# Patient Record
Sex: Female | Born: 1975 | Race: Black or African American | Hispanic: No | Marital: Single | Smoking: Former smoker
Health system: Southern US, Community
[De-identification: ages and names within clinical notes are randomized; demographics above are authoritative.]

## PROBLEM LIST (undated history)

## (undated) DIAGNOSIS — Z8679 Personal history of other diseases of the circulatory system: Secondary | ICD-10-CM

## (undated) DIAGNOSIS — F419 Anxiety disorder, unspecified: Secondary | ICD-10-CM

## (undated) DIAGNOSIS — K610 Anal abscess: Secondary | ICD-10-CM

## (undated) DIAGNOSIS — I10 Essential (primary) hypertension: Secondary | ICD-10-CM

## (undated) DIAGNOSIS — I639 Cerebral infarction, unspecified: Secondary | ICD-10-CM

## (undated) DIAGNOSIS — E669 Obesity, unspecified: Secondary | ICD-10-CM

## (undated) DIAGNOSIS — L259 Unspecified contact dermatitis, unspecified cause: Secondary | ICD-10-CM

## (undated) DIAGNOSIS — E113393 Type 2 diabetes mellitus with moderate nonproliferative diabetic retinopathy without macular edema, bilateral: Secondary | ICD-10-CM

## (undated) DIAGNOSIS — K219 Gastro-esophageal reflux disease without esophagitis: Secondary | ICD-10-CM

## (undated) DIAGNOSIS — F329 Major depressive disorder, single episode, unspecified: Secondary | ICD-10-CM

## (undated) DIAGNOSIS — K2210 Ulcer of esophagus without bleeding: Secondary | ICD-10-CM

## (undated) DIAGNOSIS — K3184 Gastroparesis: Secondary | ICD-10-CM

## (undated) DIAGNOSIS — K5900 Constipation, unspecified: Secondary | ICD-10-CM

## (undated) DIAGNOSIS — R0600 Dyspnea, unspecified: Secondary | ICD-10-CM

## (undated) DIAGNOSIS — D649 Anemia, unspecified: Secondary | ICD-10-CM

## (undated) DIAGNOSIS — Z972 Presence of dental prosthetic device (complete) (partial): Secondary | ICD-10-CM

## (undated) DIAGNOSIS — T8130XA Disruption of wound, unspecified, initial encounter: Secondary | ICD-10-CM

## (undated) DIAGNOSIS — I2510 Atherosclerotic heart disease of native coronary artery without angina pectoris: Secondary | ICD-10-CM

## (undated) DIAGNOSIS — W010XXA Fall on same level from slipping, tripping and stumbling without subsequent striking against object, initial encounter: Secondary | ICD-10-CM

## (undated) DIAGNOSIS — E1110 Type 2 diabetes mellitus with ketoacidosis without coma: Secondary | ICD-10-CM

## (undated) DIAGNOSIS — N186 End stage renal disease: Secondary | ICD-10-CM

## (undated) DIAGNOSIS — N185 Chronic kidney disease, stage 5: Secondary | ICD-10-CM

## (undated) DIAGNOSIS — R1115 Cyclical vomiting syndrome unrelated to migraine: Secondary | ICD-10-CM

## (undated) DIAGNOSIS — I739 Peripheral vascular disease, unspecified: Secondary | ICD-10-CM

## (undated) DIAGNOSIS — B20 Human immunodeficiency virus [HIV] disease: Secondary | ICD-10-CM

## (undated) DIAGNOSIS — F17200 Nicotine dependence, unspecified, uncomplicated: Secondary | ICD-10-CM

## (undated) DIAGNOSIS — E119 Type 2 diabetes mellitus without complications: Secondary | ICD-10-CM

## (undated) DIAGNOSIS — Z992 Dependence on renal dialysis: Secondary | ICD-10-CM

## (undated) DIAGNOSIS — L089 Local infection of the skin and subcutaneous tissue, unspecified: Secondary | ICD-10-CM

## (undated) DIAGNOSIS — E785 Hyperlipidemia, unspecified: Secondary | ICD-10-CM

## (undated) DIAGNOSIS — Z9289 Personal history of other medical treatment: Secondary | ICD-10-CM

## (undated) DIAGNOSIS — E1169 Type 2 diabetes mellitus with other specified complication: Secondary | ICD-10-CM

## (undated) DIAGNOSIS — S88112A Complete traumatic amputation at level between knee and ankle, left lower leg, initial encounter: Secondary | ICD-10-CM

## (undated) HISTORY — DX: Fall on same level from slipping, tripping and stumbling without subsequent striking against object, initial encounter: W01.0XXA

## (undated) HISTORY — DX: End stage renal disease: Z99.2

## (undated) HISTORY — DX: Chronic kidney disease, stage 5: N18.5

## (undated) HISTORY — DX: End stage renal disease: N18.6

## (undated) HISTORY — DX: Disruption of wound, unspecified, initial encounter: T81.30XA

## (undated) HISTORY — DX: Ulcer of esophagus without bleeding: K22.10

## (undated) HISTORY — DX: Local infection of the skin and subcutaneous tissue, unspecified: L08.9

## (undated) HISTORY — DX: Hyperlipidemia, unspecified: E78.5

## (undated) HISTORY — DX: Atherosclerotic heart disease of native coronary artery without angina pectoris: I25.10

## (undated) HISTORY — DX: Peripheral vascular disease, unspecified: I73.9

## (undated) HISTORY — DX: Complete traumatic amputation at level between knee and ankle, left lower leg, initial encounter: S88.112A

## (undated) HISTORY — DX: Cerebral infarction, unspecified: I63.9

## (undated) HISTORY — DX: Anemia, unspecified: D64.9

---
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
## 1997-12-23 ENCOUNTER — Emergency Department (HOSPITAL_COMMUNITY): Admission: EM | Admit: 1997-12-23 | Discharge: 1997-12-23 | Payer: Self-pay | Admitting: Emergency Medicine

## 1998-03-24 ENCOUNTER — Emergency Department (HOSPITAL_COMMUNITY): Admission: EM | Admit: 1998-03-24 | Discharge: 1998-03-24 | Payer: Self-pay | Admitting: Family Medicine

## 1998-06-19 ENCOUNTER — Encounter (HOSPITAL_COMMUNITY): Admission: RE | Admit: 1998-06-19 | Discharge: 1998-09-17 | Payer: Self-pay | Admitting: Obstetrics & Gynecology

## 1998-06-19 ENCOUNTER — Encounter: Admission: RE | Admit: 1998-06-19 | Discharge: 1998-06-19 | Payer: Self-pay | Admitting: Obstetrics & Gynecology

## 1998-06-19 ENCOUNTER — Encounter: Payer: Self-pay | Admitting: Obstetrics & Gynecology

## 1998-06-19 ENCOUNTER — Encounter: Admission: RE | Admit: 1998-06-19 | Discharge: 1998-09-17 | Payer: Self-pay | Admitting: Obstetrics & Gynecology

## 1998-06-26 ENCOUNTER — Encounter: Admission: RE | Admit: 1998-06-26 | Discharge: 1998-06-26 | Payer: Self-pay | Admitting: Obstetrics & Gynecology

## 1998-07-10 ENCOUNTER — Encounter: Admission: RE | Admit: 1998-07-10 | Discharge: 1998-07-10 | Payer: Self-pay | Admitting: Obstetrics & Gynecology

## 1998-07-24 ENCOUNTER — Encounter: Admission: RE | Admit: 1998-07-24 | Discharge: 1998-07-24 | Payer: Self-pay | Admitting: Obstetrics & Gynecology

## 1998-08-14 ENCOUNTER — Ambulatory Visit (HOSPITAL_COMMUNITY): Admission: RE | Admit: 1998-08-14 | Discharge: 1998-08-14 | Payer: Self-pay | Admitting: *Deleted

## 1998-08-14 ENCOUNTER — Encounter: Admission: RE | Admit: 1998-08-14 | Discharge: 1998-08-14 | Payer: Self-pay | Admitting: Obstetrics & Gynecology

## 1998-08-21 ENCOUNTER — Encounter: Admission: RE | Admit: 1998-08-21 | Discharge: 1998-08-21 | Payer: Self-pay | Admitting: Obstetrics & Gynecology

## 1998-09-11 ENCOUNTER — Encounter: Admission: RE | Admit: 1998-09-11 | Discharge: 1998-09-11 | Payer: Self-pay | Admitting: Obstetrics & Gynecology

## 1998-09-18 ENCOUNTER — Ambulatory Visit (HOSPITAL_COMMUNITY): Admission: RE | Admit: 1998-09-18 | Discharge: 1998-09-18 | Payer: Self-pay | Admitting: *Deleted

## 1998-10-02 ENCOUNTER — Other Ambulatory Visit: Admission: RE | Admit: 1998-10-02 | Discharge: 1998-10-02 | Payer: Self-pay | Admitting: Obstetrics & Gynecology

## 1998-10-02 ENCOUNTER — Encounter: Admission: RE | Admit: 1998-10-02 | Discharge: 1998-10-02 | Payer: Self-pay | Admitting: Obstetrics & Gynecology

## 1998-10-07 ENCOUNTER — Encounter: Admission: RE | Admit: 1998-10-07 | Discharge: 1998-10-07 | Payer: Self-pay | Admitting: Obstetrics & Gynecology

## 1998-10-23 ENCOUNTER — Encounter: Admission: RE | Admit: 1998-10-23 | Discharge: 1998-10-23 | Payer: Self-pay | Admitting: Obstetrics & Gynecology

## 1998-10-28 ENCOUNTER — Ambulatory Visit (HOSPITAL_COMMUNITY): Admission: RE | Admit: 1998-10-28 | Discharge: 1998-10-28 | Payer: Self-pay | Admitting: Obstetrics

## 1998-11-06 ENCOUNTER — Encounter: Admission: RE | Admit: 1998-11-06 | Discharge: 1998-11-06 | Payer: Self-pay | Admitting: Obstetrics & Gynecology

## 1998-11-07 ENCOUNTER — Inpatient Hospital Stay (HOSPITAL_COMMUNITY): Admission: AD | Admit: 1998-11-07 | Discharge: 1998-11-07 | Payer: Self-pay | Admitting: Obstetrics & Gynecology

## 1998-11-27 ENCOUNTER — Encounter: Admission: RE | Admit: 1998-11-27 | Discharge: 1998-11-27 | Payer: Self-pay | Admitting: Obstetrics & Gynecology

## 1998-12-02 ENCOUNTER — Encounter: Payer: Self-pay | Admitting: Obstetrics & Gynecology

## 1998-12-02 ENCOUNTER — Inpatient Hospital Stay (HOSPITAL_COMMUNITY): Admission: AD | Admit: 1998-12-02 | Discharge: 1998-12-02 | Payer: Self-pay | Admitting: *Deleted

## 1998-12-04 ENCOUNTER — Encounter (HOSPITAL_COMMUNITY): Admission: AD | Admit: 1998-12-04 | Discharge: 1998-12-16 | Payer: Self-pay | Admitting: Obstetrics

## 1998-12-04 ENCOUNTER — Encounter: Admission: RE | Admit: 1998-12-04 | Discharge: 1998-12-04 | Payer: Self-pay | Admitting: Obstetrics & Gynecology

## 1998-12-11 ENCOUNTER — Inpatient Hospital Stay (HOSPITAL_COMMUNITY): Admission: AD | Admit: 1998-12-11 | Discharge: 1998-12-11 | Payer: Self-pay | Admitting: Obstetrics

## 1998-12-11 ENCOUNTER — Encounter: Admission: RE | Admit: 1998-12-11 | Discharge: 1998-12-11 | Payer: Self-pay | Admitting: Obstetrics & Gynecology

## 1998-12-11 ENCOUNTER — Encounter: Payer: Self-pay | Admitting: Obstetrics

## 1998-12-14 ENCOUNTER — Inpatient Hospital Stay (HOSPITAL_COMMUNITY): Admission: AD | Admit: 1998-12-14 | Discharge: 1998-12-16 | Payer: Self-pay | Admitting: Obstetrics

## 2000-02-27 ENCOUNTER — Inpatient Hospital Stay (HOSPITAL_COMMUNITY): Admission: AD | Admit: 2000-02-27 | Discharge: 2000-02-27 | Payer: Self-pay | Admitting: *Deleted

## 2000-02-27 ENCOUNTER — Encounter: Payer: Self-pay | Admitting: *Deleted

## 2000-03-16 ENCOUNTER — Emergency Department (HOSPITAL_COMMUNITY): Admission: EM | Admit: 2000-03-16 | Discharge: 2000-03-16 | Payer: Self-pay | Admitting: Emergency Medicine

## 2000-05-05 ENCOUNTER — Encounter: Admission: RE | Admit: 2000-05-05 | Discharge: 2000-05-05 | Payer: Self-pay | Admitting: Obstetrics & Gynecology

## 2000-05-10 ENCOUNTER — Encounter: Admission: RE | Admit: 2000-05-10 | Discharge: 2000-08-08 | Payer: Self-pay | Admitting: Obstetrics & Gynecology

## 2000-05-12 ENCOUNTER — Encounter: Admission: RE | Admit: 2000-05-12 | Discharge: 2000-05-12 | Payer: Self-pay | Admitting: Obstetrics & Gynecology

## 2000-06-02 ENCOUNTER — Ambulatory Visit (HOSPITAL_COMMUNITY): Admission: RE | Admit: 2000-06-02 | Discharge: 2000-06-02 | Payer: Self-pay | Admitting: Obstetrics & Gynecology

## 2000-06-09 ENCOUNTER — Encounter: Admission: RE | Admit: 2000-06-09 | Discharge: 2000-06-09 | Payer: Self-pay | Admitting: Obstetrics & Gynecology

## 2000-06-23 ENCOUNTER — Encounter: Admission: RE | Admit: 2000-06-23 | Discharge: 2000-06-23 | Payer: Self-pay | Admitting: Obstetrics & Gynecology

## 2000-08-10 HISTORY — PX: TUBAL LIGATION: SHX77

## 2000-08-11 ENCOUNTER — Encounter: Admission: RE | Admit: 2000-08-11 | Discharge: 2000-08-11 | Payer: Self-pay | Admitting: Obstetrics & Gynecology

## 2000-08-18 ENCOUNTER — Encounter: Admission: RE | Admit: 2000-08-18 | Discharge: 2000-08-18 | Payer: Self-pay | Admitting: Obstetrics & Gynecology

## 2000-08-25 ENCOUNTER — Encounter: Admission: RE | Admit: 2000-08-25 | Discharge: 2000-08-25 | Payer: Self-pay | Admitting: Obstetrics & Gynecology

## 2000-08-25 ENCOUNTER — Encounter (HOSPITAL_COMMUNITY): Admission: RE | Admit: 2000-08-25 | Discharge: 2000-10-07 | Payer: Self-pay | Admitting: Obstetrics & Gynecology

## 2000-09-15 ENCOUNTER — Encounter: Admission: RE | Admit: 2000-09-15 | Discharge: 2000-09-15 | Payer: Self-pay | Admitting: Obstetrics & Gynecology

## 2000-09-22 ENCOUNTER — Encounter: Admission: RE | Admit: 2000-09-22 | Discharge: 2000-09-22 | Payer: Self-pay | Admitting: Obstetrics & Gynecology

## 2000-09-27 ENCOUNTER — Encounter: Payer: Self-pay | Admitting: *Deleted

## 2000-09-29 ENCOUNTER — Encounter: Admission: RE | Admit: 2000-09-29 | Discharge: 2000-09-29 | Payer: Self-pay | Admitting: Obstetrics & Gynecology

## 2000-09-29 ENCOUNTER — Observation Stay (HOSPITAL_COMMUNITY): Admission: AD | Admit: 2000-09-29 | Discharge: 2000-09-30 | Payer: Self-pay | Admitting: *Deleted

## 2000-09-29 ENCOUNTER — Encounter: Payer: Self-pay | Admitting: Obstetrics

## 2000-10-03 ENCOUNTER — Inpatient Hospital Stay (HOSPITAL_COMMUNITY): Admission: AD | Admit: 2000-10-03 | Discharge: 2000-10-03 | Payer: Self-pay | Admitting: Obstetrics & Gynecology

## 2000-10-05 ENCOUNTER — Inpatient Hospital Stay (HOSPITAL_COMMUNITY): Admission: AD | Admit: 2000-10-05 | Discharge: 2000-10-09 | Payer: Self-pay | Admitting: *Deleted

## 2000-10-05 ENCOUNTER — Encounter (INDEPENDENT_AMBULATORY_CARE_PROVIDER_SITE_OTHER): Payer: Self-pay

## 2000-11-12 ENCOUNTER — Inpatient Hospital Stay (HOSPITAL_COMMUNITY): Admission: AD | Admit: 2000-11-12 | Discharge: 2000-11-12 | Payer: Self-pay | Admitting: *Deleted

## 2001-11-07 ENCOUNTER — Emergency Department (HOSPITAL_COMMUNITY): Admission: EM | Admit: 2001-11-07 | Discharge: 2001-11-07 | Payer: Self-pay | Admitting: Emergency Medicine

## 2001-12-12 ENCOUNTER — Emergency Department (HOSPITAL_COMMUNITY): Admission: EM | Admit: 2001-12-12 | Discharge: 2001-12-12 | Payer: Self-pay | Admitting: Emergency Medicine

## 2002-02-23 ENCOUNTER — Encounter: Payer: Self-pay | Admitting: Emergency Medicine

## 2002-02-23 ENCOUNTER — Emergency Department (HOSPITAL_COMMUNITY): Admission: EM | Admit: 2002-02-23 | Discharge: 2002-02-23 | Payer: Self-pay | Admitting: *Deleted

## 2003-08-09 ENCOUNTER — Emergency Department (HOSPITAL_COMMUNITY): Admission: EM | Admit: 2003-08-09 | Discharge: 2003-08-09 | Payer: Self-pay | Admitting: Emergency Medicine

## 2003-08-20 ENCOUNTER — Encounter: Admission: RE | Admit: 2003-08-20 | Discharge: 2003-08-20 | Payer: Self-pay | Admitting: Internal Medicine

## 2003-12-10 ENCOUNTER — Emergency Department (HOSPITAL_COMMUNITY): Admission: EM | Admit: 2003-12-10 | Discharge: 2003-12-10 | Payer: Self-pay | Admitting: Emergency Medicine

## 2004-03-21 ENCOUNTER — Encounter: Admission: RE | Admit: 2004-03-21 | Discharge: 2004-03-21 | Payer: Self-pay | Admitting: Internal Medicine

## 2004-06-05 ENCOUNTER — Emergency Department (HOSPITAL_COMMUNITY): Admission: EM | Admit: 2004-06-05 | Discharge: 2004-06-05 | Payer: Self-pay | Admitting: Emergency Medicine

## 2005-03-02 ENCOUNTER — Emergency Department (HOSPITAL_COMMUNITY): Admission: EM | Admit: 2005-03-02 | Discharge: 2005-03-02 | Payer: Self-pay | Admitting: Emergency Medicine

## 2005-09-01 ENCOUNTER — Ambulatory Visit: Payer: Self-pay | Admitting: Hospitalist

## 2005-09-09 ENCOUNTER — Ambulatory Visit: Payer: Self-pay | Admitting: Internal Medicine

## 2005-11-19 ENCOUNTER — Ambulatory Visit: Payer: Self-pay | Admitting: Internal Medicine

## 2006-06-28 DIAGNOSIS — F32A Depression, unspecified: Secondary | ICD-10-CM

## 2006-06-28 DIAGNOSIS — F17200 Nicotine dependence, unspecified, uncomplicated: Secondary | ICD-10-CM | POA: Insufficient documentation

## 2006-06-28 DIAGNOSIS — F329 Major depressive disorder, single episode, unspecified: Secondary | ICD-10-CM

## 2006-06-28 HISTORY — DX: Nicotine dependence, unspecified, uncomplicated: F17.200

## 2006-06-28 HISTORY — DX: Depression, unspecified: F32.A

## 2006-08-26 ENCOUNTER — Telehealth (INDEPENDENT_AMBULATORY_CARE_PROVIDER_SITE_OTHER): Payer: Self-pay | Admitting: *Deleted

## 2006-09-09 ENCOUNTER — Encounter (INDEPENDENT_AMBULATORY_CARE_PROVIDER_SITE_OTHER): Payer: Self-pay | Admitting: Infectious Diseases

## 2006-09-09 ENCOUNTER — Ambulatory Visit: Payer: Self-pay | Admitting: Internal Medicine

## 2006-09-09 LAB — CONVERTED CEMR LAB
ALT: 12 units/L (ref 0–35)
AST: 11 units/L (ref 0–37)
Albumin: 3.9 g/dL (ref 3.5–5.2)
Alkaline Phosphatase: 77 units/L (ref 39–117)
BUN: 9 mg/dL (ref 6–23)
CO2: 24 meq/L (ref 19–32)
Calcium: 8.9 mg/dL (ref 8.4–10.5)
Chloride: 107 meq/L (ref 96–112)
Creatinine, Ser: 0.66 mg/dL (ref 0.40–1.20)
Creatinine, Urine: 207.1 mg/dL
Glucose, Bld: 225 mg/dL
Glucose, Bld: 196 mg/dL — ABNORMAL HIGH (ref 70–99)
Hgb A1c MFr Bld: 9.4 %
Microalb Creat Ratio: 7 mg/g (ref 0.0–30.0)
Microalb, Ur: 1.46 mg/dL (ref 0.00–1.89)
Potassium: 4.1 meq/L (ref 3.5–5.3)
Sodium: 139 meq/L (ref 135–145)
Total Bilirubin: 0.5 mg/dL (ref 0.3–1.2)
Total Protein: 7.3 g/dL (ref 6.0–8.3)

## 2006-09-11 ENCOUNTER — Emergency Department (HOSPITAL_COMMUNITY): Admission: EM | Admit: 2006-09-11 | Discharge: 2006-09-11 | Payer: Self-pay | Admitting: Emergency Medicine

## 2006-10-02 ENCOUNTER — Inpatient Hospital Stay (HOSPITAL_COMMUNITY): Admission: AD | Admit: 2006-10-02 | Discharge: 2006-10-02 | Payer: Self-pay | Admitting: Obstetrics & Gynecology

## 2006-12-09 ENCOUNTER — Emergency Department (HOSPITAL_COMMUNITY): Admission: EM | Admit: 2006-12-09 | Discharge: 2006-12-09 | Payer: Self-pay | Admitting: Emergency Medicine

## 2006-12-30 ENCOUNTER — Emergency Department (HOSPITAL_COMMUNITY): Admission: EM | Admit: 2006-12-30 | Discharge: 2006-12-31 | Payer: Self-pay | Admitting: Emergency Medicine

## 2007-12-06 ENCOUNTER — Encounter (INDEPENDENT_AMBULATORY_CARE_PROVIDER_SITE_OTHER): Payer: Self-pay | Admitting: *Deleted

## 2007-12-21 ENCOUNTER — Emergency Department (HOSPITAL_COMMUNITY): Admission: EM | Admit: 2007-12-21 | Discharge: 2007-12-21 | Payer: Self-pay | Admitting: Emergency Medicine

## 2008-03-03 ENCOUNTER — Emergency Department (HOSPITAL_COMMUNITY): Admission: EM | Admit: 2008-03-03 | Discharge: 2008-03-03 | Payer: Self-pay | Admitting: Emergency Medicine

## 2008-09-20 ENCOUNTER — Emergency Department (HOSPITAL_COMMUNITY): Admission: EM | Admit: 2008-09-20 | Discharge: 2008-09-20 | Payer: Self-pay | Admitting: Emergency Medicine

## 2008-10-24 ENCOUNTER — Emergency Department (HOSPITAL_COMMUNITY): Admission: EM | Admit: 2008-10-24 | Discharge: 2008-10-25 | Payer: Self-pay | Admitting: Emergency Medicine

## 2009-02-05 ENCOUNTER — Telehealth: Payer: Self-pay | Admitting: *Deleted

## 2009-02-05 ENCOUNTER — Emergency Department (HOSPITAL_COMMUNITY): Admission: EM | Admit: 2009-02-05 | Discharge: 2009-02-05 | Payer: Self-pay | Admitting: Emergency Medicine

## 2009-06-03 ENCOUNTER — Emergency Department (HOSPITAL_COMMUNITY): Admission: EM | Admit: 2009-06-03 | Discharge: 2009-06-04 | Payer: Self-pay | Admitting: Emergency Medicine

## 2009-12-03 ENCOUNTER — Emergency Department (HOSPITAL_COMMUNITY): Admission: EM | Admit: 2009-12-03 | Discharge: 2009-12-03 | Payer: Self-pay | Admitting: Family Medicine

## 2010-09-11 NOTE — Progress Notes (Signed)
  Phone Note Outgoing Call Call back at [facility] Phone [phone]-   Call placed by: Kathy Frank,  August 26, 2006 1:23 PM Summary of Call: Called pt to inform that Dr. Riccardo Dubin would like her to make an appt to see him within the next month. Spoke with pt's grandmother who stated she would tell her granddaughter to call and make the appt.

## 2010-09-11 NOTE — Progress Notes (Signed)
Summary: Refill  Phone Note Call from Patient   Caller: Patient Summary of Call: Call from pt left message that she needed  refill on her medicine.  Did not leave name of medication.  RTC to pt at # she left on VM that Clinics had called. Initial call taken by: Sander Nephew RN,  February 05, 2009 10:54 AM

## 2010-09-11 NOTE — Miscellaneous (Signed)
Summary: [place] By: Bonner Puna 12/06/2007 16:25:17  _____________________________________________________________________  External Attachment:    Type:   Image     Comment:   External Document

## 2010-09-11 NOTE — Assessment & Plan Note (Signed)
Summary: NEED MEDICATION/ SB.   Vital Signs:  Patient Profile:   35 Years Old Female Weight:      191.9 pounds Temp:     97.5 degrees F oral Pulse rate:   80 / minute BP sitting:   127 / 98  (right arm)  Pt. in pain?   no  Vitals Entered By: Silverio Decamp (September 09, 2006 11:01 AM)              Is Patient Diabetic? Yes   Have you ever been in a relationship where you felt threatened, hurt or afraid?No   Does patient need assistance? Functional Status Self care      Chief Complaint:  need refills.  History of Present Illness: Ms Makinson is a 29 yr AAF with a PMH of DM, Depression with 2 suicidal attempts and tobacco abuse. She is here to  try to see if she can get help with quitting smoking. She had used wellbutrin in the past and it had helped her significantly.Her depression has been fairly stable and she has not had suicidal thoughts but she has run out of her wellbutrin and has not taken any for the last few months.She has also run out of her Metformin and her blood sugars have been in the 250s. As she is back on medicaid she wishes to resume care with her psychatrist as well as with [place]  Prior Medications: WELLBUTRIN XL 150 MG TB24 (BUPROPION HCL)  METFORMIN HCL 850 MG TABS (METFORMIN HCL) Take 1 tablet by mouth once a day Current Allergies (reviewed today): No known allergies    Social History:    Single:    Current Smoker    Alcohol use-no    Drug use-no   Risk Factors:     Counseled to quit/cut down tobacco use:  yes Drug use:  no Alcohol use:  no    Review of Systems  The patient denies fever, chills, orthopnea, PND, suicidal ideation, hallucinations, paranoia, and unusual weight change.     Physical Exam  General:     alert and well-developed.   Head:     normocephalic and atraumatic.   Ears:     R ear normal and L ear normal.   Mouth:     good dentition.   Neck:     supple and full ROM.   Lungs:     normal respiratory effort, no  intercostal retractions, no accessory muscle use, and no dullness.   Heart:     normal rate, regular rhythm, no murmur, and no gallop.   Abdomen:     soft, non-tender, normal bowel sounds, no distention, and no masses.   Pulses:     R radial normal and R femoral normal.   Neurologic:     alert & oriented X3, cranial nerves II-XII intact, strength normal in all extremities, sensation intact to light touch, and sensation intact to pinprick.   Psych:     Oriented X3, memory intact for recent and remote, normally interactive, good eye contact, not anxious appearing, and not depressed appearing.      Impression & Recommendations:  Problem # 1:  TOBACCO USER (ICD-305.1) Patient has been smoking heavily over the last few months. She had tried Wellbutrin in the past which helped her quit and wishes to try that again. Side effects were explained and a prescription given. As she also has a history of depression, wellbutrin was chosen  Problem # 2:  DIABETES MELLITUS, TYPE II (ICD-250.00)  Poor control with a  HbAIC of 9.4. She had stopped taking her metformin for the last few months. Foot exam normal. Urine microalbumin/creatinine  will be checked and  an eye exam scheduled. Patient restarted on Metformin 850 mg daily. Will folllow in 1 month. Her updated medication list for this problem includes: Metformin Hcl 850 Mg Tabs (Metformin hcl) .Marland Kitchen... Take 1 tablet by mouth once a day  Orders: T- Capillary Blood Glucose [id number]) T-Hgb A1C (in-house) [id number]) T-Comprehensive Metabolic Panel ([id number]) T-Urine Microalbumin w/creat. ratio [id number] / [id number])  Future Orders: Ophthalmology Referral (Ophthalmology) ... 10/21/2006     Problem # 3:  DEPRESSION (ICD-311) No symptoms of depression. No suicidal thoughts.She has been off her wellbutrin. She also wishes to re establish care with her psychiatrist Her updated medication list for this problem includes: Wellbutrin Xl 150 Mg Tb24 (Bupropion  hcl)  Orders: Psychiatric Referral (Psych)     Patient Instructions: 1)  Please schedule a follow-up appointment in 1 month. 2)  Discussed the hazards of tobacco smoking (use). Smoking cessation recommended and techniques and options to help patient quit were discussed. 3)  The patient was encouraged to lose weight for better health.    4)  The importance of monitoring the HgBA1c level regularly was reviewed.  5)  The importance of proper foot care and regularly checking feet to prevent sores and possibly loss of limbs was reviewed . 6)  Recommended referral to Diabetes Education Program and appointment to be scheduled. Stressed importance of keeping appointment as knowing more about diabetes and how to manage diabetes can help prevent its complications .  Laboratory Results      Date/Time Recieved:  September 09, 2006 11:47 AM ..................................................................Marland KitchenMaryan Rued  September 09, 2006 11:47 AM  Date/Time Reported:  September 09, 2006 11:47 AM   Blood Tests Glucose (random): 225 mg/dL   (Normal Range: 70-105) HGBA1C: 9.4%   (Normal Range: Non-Diabetic - 3-6%   Control Diabetic - 6-8%)   Other Tests

## 2010-11-13 LAB — DIFFERENTIAL
Basophils Absolute: 0 10*3/uL (ref 0.0–0.1)
Basophils Relative: 0 % (ref 0–1)
Eosinophils Absolute: 0 10*3/uL (ref 0.0–0.7)
Eosinophils Relative: 0 % (ref 0–5)
Lymphocytes Relative: 7 % — ABNORMAL LOW (ref 12–46)
Lymphs Abs: 0.8 10*3/uL (ref 0.7–4.0)
Monocytes Absolute: 0.3 10*3/uL (ref 0.1–1.0)
Monocytes Relative: 3 % (ref 3–12)
Neutro Abs: 9.9 10*3/uL — ABNORMAL HIGH (ref 1.7–7.7)
Neutrophils Relative %: 89 % — ABNORMAL HIGH (ref 43–77)

## 2010-11-13 LAB — URINALYSIS, ROUTINE W REFLEX MICROSCOPIC
Bilirubin Urine: NEGATIVE
Glucose, UA: >1000 mg/dL — AB
Ketones, ur: 15 mg/dL — AB
Leukocytes, UA: NEGATIVE
Nitrite: NEGATIVE
Protein, ur: 100 mg/dL — AB
Specific Gravity, Urine: 1.029 (ref 1.005–1.030)
Urobilinogen, UA: 0.2 mg/dL (ref 0.0–1.0)
pH: 7.5 (ref 5.0–8.0)

## 2010-11-13 LAB — CBC
HCT: 40.5 % (ref 36.0–46.0)
Hemoglobin: 14.1 g/dL (ref 12.0–15.0)
MCHC: 34.8 g/dL (ref 30.0–36.0)
MCV: 87.3 fL (ref 78.0–100.0)
Platelets: 286 10*3/uL (ref 150–400)
RBC: 4.64 MIL/uL (ref 3.87–5.11)
RDW: 13.1 % (ref 11.5–15.5)
WBC: 11.1 10*3/uL — ABNORMAL HIGH (ref 4.0–10.5)

## 2010-11-13 LAB — COMPREHENSIVE METABOLIC PANEL
ALT: 17 U/L (ref 0–35)
AST: 19 U/L (ref 0–37)
Albumin: 3.9 g/dL (ref 3.5–5.2)
Alkaline Phosphatase: 84 U/L (ref 39–117)
BUN: 5 mg/dL — ABNORMAL LOW (ref 6–23)
CO2: 23 mEq/L (ref 19–32)
Calcium: 9.4 mg/dL (ref 8.4–10.5)
Chloride: 104 mEq/L (ref 96–112)
Creatinine, Ser: 0.64 mg/dL (ref 0.4–1.2)
GFR calc Af Amer: >60 mL/min (ref >60)
GFR calc non Af Amer: >60 mL/min (ref >60)
Glucose, Bld: 371 mg/dL — ABNORMAL HIGH (ref 70–99)
Potassium: 3.7 mEq/L (ref 3.5–5.1)
Sodium: 140 mEq/L (ref 135–145)
Total Bilirubin: 0.6 mg/dL (ref 0.3–1.2)
Total Protein: 8 g/dL (ref 6.0–8.3)

## 2010-11-13 LAB — URINE MICROSCOPIC-ADD ON

## 2010-11-13 LAB — POCT PREGNANCY, URINE: Preg Test, Ur: NEGATIVE

## 2010-11-13 LAB — URINE CULTURE: Colony Count: 45000

## 2010-11-13 LAB — GLUCOSE, CAPILLARY
Glucose-Capillary: 301 mg/dL — ABNORMAL HIGH (ref 70–99)
Glucose-Capillary: 322 mg/dL — ABNORMAL HIGH (ref 70–99)

## 2010-11-17 LAB — CBC
HCT: 37.6 % (ref 36.0–46.0)
Hemoglobin: 12.9 g/dL (ref 12.0–15.0)
MCHC: 34.2 g/dL (ref 30.0–36.0)
MCV: 87.8 fL (ref 78.0–100.0)
Platelets: 262 10*3/uL (ref 150–400)
RBC: 4.28 MIL/uL (ref 3.87–5.11)
RDW: 13.4 % (ref 11.5–15.5)
WBC: 6.9 10*3/uL (ref 4.0–10.5)

## 2010-11-17 LAB — POCT CARDIAC MARKERS
CKMB, poc: <1 ng/mL — ABNORMAL LOW (ref 1.0–8.0)
Myoglobin, poc: 49.1 ng/mL (ref 12–200)
Troponin i, poc: <0.05 ng/mL (ref 0.00–0.09)

## 2010-11-17 LAB — DIFFERENTIAL
Basophils Absolute: 0.1 10*3/uL (ref 0.0–0.1)
Basophils Relative: 1 % (ref 0–1)
Eosinophils Absolute: 0.2 10*3/uL (ref 0.0–0.7)
Eosinophils Relative: 2 % (ref 0–5)
Lymphocytes Relative: 33 % (ref 12–46)
Lymphs Abs: 2.3 10*3/uL (ref 0.7–4.0)
Monocytes Absolute: 0.4 10*3/uL (ref 0.1–1.0)
Monocytes Relative: 5 % (ref 3–12)
Neutro Abs: 4 10*3/uL (ref 1.7–7.7)
Neutrophils Relative %: 59 % (ref 43–77)

## 2010-11-17 LAB — POCT PREGNANCY, URINE: Preg Test, Ur: NEGATIVE

## 2010-11-17 LAB — BASIC METABOLIC PANEL
BUN: 9 mg/dL (ref 6–23)
CO2: 25 mEq/L (ref 19–32)
Calcium: 9.3 mg/dL (ref 8.4–10.5)
Chloride: 106 mEq/L (ref 96–112)
Creatinine, Ser: 0.67 mg/dL (ref 0.4–1.2)
GFR calc Af Amer: >60 mL/min (ref >60)
GFR calc non Af Amer: >60 mL/min (ref >60)
Glucose, Bld: 303 mg/dL — ABNORMAL HIGH (ref 70–99)
Potassium: 3.3 mEq/L — ABNORMAL LOW (ref 3.5–5.1)
Sodium: 139 mEq/L (ref 135–145)

## 2010-11-17 LAB — GLUCOSE, CAPILLARY
Glucose-Capillary: 284 mg/dL — ABNORMAL HIGH (ref 70–99)
Glucose-Capillary: 92 mg/dL (ref 70–99)

## 2010-11-20 LAB — URINALYSIS, ROUTINE W REFLEX MICROSCOPIC
Bilirubin Urine: NEGATIVE
Glucose, UA: >1000 mg/dL — AB
Ketones, ur: >80 mg/dL — AB
Leukocytes, UA: NEGATIVE
Nitrite: NEGATIVE
Protein, ur: 100 mg/dL — AB
Specific Gravity, Urine: >1.046 — ABNORMAL HIGH (ref 1.005–1.030)
Urobilinogen, UA: 0.2 mg/dL (ref 0.0–1.0)
pH: 6.5 (ref 5.0–8.0)

## 2010-11-20 LAB — POCT I-STAT, CHEM 8
BUN: 13 mg/dL (ref 6–23)
Calcium, Ion: 1.06 mmol/L — ABNORMAL LOW (ref 1.12–1.32)
Chloride: 105 mEq/L (ref 96–112)
Creatinine, Ser: 0.5 mg/dL (ref 0.4–1.2)
Glucose, Bld: 395 mg/dL — ABNORMAL HIGH (ref 70–99)
HCT: 44 % (ref 36.0–46.0)
Hemoglobin: 15 g/dL (ref 12.0–15.0)
Potassium: 3.7 mEq/L (ref 3.5–5.1)
Sodium: 137 mEq/L (ref 135–145)
TCO2: 19 mmol/L (ref 0–100)

## 2010-11-20 LAB — URINE MICROSCOPIC-ADD ON

## 2010-11-20 LAB — POCT PREGNANCY, URINE: Preg Test, Ur: NEGATIVE

## 2010-11-20 LAB — KETONES, QUALITATIVE: Acetone, Bld: NEGATIVE

## 2010-12-26 NOTE — Discharge Summary (Signed)
[facility]  Patient:    Kathy Frank, Kathy Frank                      MRN: [id number] Adm. Date:  [id number] Disc. Date: [id number] Attending:  Edmonia Caprio Dictator:   Bonna Gains, C.N.M.                           Discharge Summary  ADMISSION DIAGNOSES:          1. A 38-5/7 intrauterine pregnancy.                               2. Pregestational diabetes with borderline                                  control.                               3. Desirous of permanent sterilization.                               4. Positive group beta streptococcus.                               5. Positive UDS ______ .  DISCHARGE DIAGNOSES:          1. Normal spontaneous vaginal delivery,                                  vigorous female, 7 pounds 11 ounces.                               2. Postpartum bilateral tubal ligation.                               3. Pregestational diabetes.  PRESENT HISTORY:              This is a 35 year old, G4, P2-0-1-2, presenting at 38-5/7 weeks for induction of labor due to having diabetes mellitus on insulin. She denied any leakage of fluid, vaginal bleeding, had good fetal movement. Of note, she was admitted for observation a week prior to this due to a fetal heart rate variable deceleration. Her fetal testing was all reassuring following that. Her pregnancy care was in [facility]. Her main complication was pregestational diabetes for which her insulin had to be adjusted. She was borderline control until third trimester where she was fairly well controlled. She had positive beta strep on September 30, 2000. GC negative, Chlamydia negative, hepatitis negative, HIV declined, RPR negative. Her hemoglobin was 12.6. She was Rh positive.  OBSTETRICAL HISTORY:          Significant for one elective AB and two term NSVDs. Of note, with her first vaginal delivery she did have preeclampsia postpartum.  FAMILY HISTORY:                Noncontributory.  SOCIAL HISTORY:  The patient denied tobacco and alcohol. She admitted to using marijuana; no other substance abuse.  ADMISSION PHYSICAL EXAMINATION:                  She was afebrile, normotensive. Fetal heart rate was in the 150s and reactive. She had an isolated variable only. Her cervix was 1 cm, 50%, and high. Her recent amniotic fluid index was 16.6 a week ago, and the estimated fetal weight then was 39th percentile for 38 weeks.  HOSPITAL COURSE:              The patient was admitted for Cervidil cervical ripening due to having had significant variable decelerations in the recent past, and she was also to get her GBS prophylaxis when in labor which she did receive. She received Cervidil twice and progressed to have a normal spontaneous vaginal delivery of a female, 7 pounds 11 ounces. On hospital day #2, she went to the OR for her postpartum bilateral tubal ligation. Findings were normal uterus, tubes, and ovaries. She had a normal postoperative course and was discharged on Mary 2, 2002 in good condition.  DISCHARGE FOLLOWUP:           Her followup was to be at [facility] in six weeks. She received Depo-Provera 150 mg predischarge for family planning. DD:  12/20/00 TD:  12/20/00 Job: [id number]

## 2010-12-26 NOTE — Op Note (Signed)
[facility]  Patient:    Kathy Frank, Kathy Frank                      MRN: [id number] Proc. Date: 10/08/00 Adm. Date:  [id number] Disc. Date: [id number] Attending:  Lahoma Crocker Dictator:   Irena Reichmann, M.D.                           Operative Report  DATE OF BIRTH:                1976/05/18  PREOPERATIVE DIAGNOSIS:       Multiparity, desires permanent sterilization.  POSTOPERATIVE DIAGNOSIS:      Multiparity, desires permanent sterilization.  PROCEDURE:                    Postpartum tubal ligation, Pomeroy method.  SURGEON:                      Cranston Neighbor, M.D.  ASSISTANT:                    Irena Reichmann, M.D.  ANESTHESIA:                   Epidural.  COMPLICATIONS:                None.  ESTIMATED BLOOD LOSS:         Less than 20 cc.  FLUIDS:                       600 cc lactated Ringers.  INDICATIONS:                  A 35 year old, G4, P3-0-1-3, postpartum day 27, status post normal spontaneous vaginal delivery who desires permanent sterilization. Risk and benefits of procedure discussed with patient including risk of failure 3-5:1000 with increased risk of ectopic gestation if pregnancy occurs.  FINDINGS:                     Normal uterus, tubes, and ovaries.  DESCRIPTION OF PROCEDURE:     The patient was taken to the operating room where epidural was found to be adequate. Eight cc of 0.5% Marcaine were introduced into the plane of incision. A small transverse infraumbilical skin incision was then made with the scalpel. The incision was carried down through the underlying fascia with the peritoneum identified and entered. The peritoneum was noted to be free of any adhesions and the incision was then extended with Metzenbaum scissors. The patients left fallopian tube was then identified, brought to the incision, and grasped with Babcock clamps. The tube was followed out to the fimbria. A Babcock clamp was then used  to grasp the tube approximately 4 cm from the cornual region. A 3-cm segment of tube was then ligated x 2 and excised. Good hemostasis was noted and the tube returned to the abdomen. The right fallopian tube was then ligated and a 3-cm segment excised in a similar fashion. Excellent hemostasis was noted and the tube returned to the abdomen. The peritoneum was closed in a pursestring method using 2-0 chromic suture on a SH needle. The fascia was closed in a single layer using 3-0 Vicryl suture. Several subcutaneous stitches were used to approximately the subcutaneous tissue, avoiding any dead space. The skin was closed in a subcuticular fashion using  3-0 Vicryl suture. The patient tolerated the procedure well. Sponge, lap, and needle counts were correct x 2. The patient was taken to the recovery room in stable condition.  PATHOLOGY:                    Segments of right and left fallopian tubes. DD:  10/08/00 TD:  10/08/00 Job: [id number]

## 2012-10-27 ENCOUNTER — Encounter (HOSPITAL_COMMUNITY): Payer: Self-pay | Admitting: Emergency Medicine

## 2012-10-27 DIAGNOSIS — E1169 Type 2 diabetes mellitus with other specified complication: Secondary | ICD-10-CM | POA: Insufficient documentation

## 2012-10-27 DIAGNOSIS — R1110 Vomiting, unspecified: Secondary | ICD-10-CM | POA: Insufficient documentation

## 2012-10-27 DIAGNOSIS — R1013 Epigastric pain: Secondary | ICD-10-CM | POA: Insufficient documentation

## 2012-10-27 DIAGNOSIS — Z79899 Other long term (current) drug therapy: Secondary | ICD-10-CM | POA: Insufficient documentation

## 2012-10-27 LAB — CBC WITH DIFFERENTIAL/PLATELET
Basophils Absolute: 0 10*3/uL (ref 0.0–0.1)
Basophils Relative: 0 % (ref 0–1)
Eosinophils Absolute: 0 10*3/uL (ref 0.0–0.7)
Eosinophils Relative: 0 % (ref 0–5)
HCT: 37.3 % (ref 36.0–46.0)
Hemoglobin: 13.6 g/dL (ref 12.0–15.0)
Lymphocytes Relative: 10 % — ABNORMAL LOW (ref 12–46)
Lymphs Abs: 1 10*3/uL (ref 0.7–4.0)
MCH: 29.6 pg (ref 26.0–34.0)
MCHC: 36.5 g/dL — ABNORMAL HIGH (ref 30.0–36.0)
MCV: 81.1 fL (ref 78.0–100.0)
Monocytes Absolute: 0.3 10*3/uL (ref 0.1–1.0)
Monocytes Relative: 3 % (ref 3–12)
Neutro Abs: 9.1 10*3/uL — ABNORMAL HIGH (ref 1.7–7.7)
Neutrophils Relative %: 87 % — ABNORMAL HIGH (ref 43–77)
Platelets: 299 10*3/uL (ref 150–400)
RBC: 4.6 MIL/uL (ref 3.87–5.11)
RDW: 12.6 % (ref 11.5–15.5)
WBC: 10.5 10*3/uL (ref 4.0–10.5)

## 2012-10-27 LAB — COMPREHENSIVE METABOLIC PANEL
ALT: 9 U/L (ref 0–35)
AST: 12 U/L (ref 0–37)
Albumin: 3.2 g/dL — ABNORMAL LOW (ref 3.5–5.2)
Alkaline Phosphatase: 87 U/L (ref 39–117)
BUN: 15 mg/dL (ref 6–23)
CO2: 20 mEq/L (ref 19–32)
Calcium: 9.3 mg/dL (ref 8.4–10.5)
Chloride: 99 mEq/L (ref 96–112)
Creatinine, Ser: 0.72 mg/dL (ref 0.50–1.10)
GFR calc Af Amer: >90 mL/min (ref >90)
GFR calc non Af Amer: >90 mL/min (ref >90)
Glucose, Bld: 397 mg/dL — ABNORMAL HIGH (ref 70–99)
Potassium: 3.4 mEq/L — ABNORMAL LOW (ref 3.5–5.1)
Sodium: 137 mEq/L (ref 135–145)
Total Bilirubin: 0.5 mg/dL (ref 0.3–1.2)
Total Protein: 7.5 g/dL (ref 6.0–8.3)

## 2012-10-27 LAB — GLUCOSE, CAPILLARY: Glucose-Capillary: 378 mg/dL — ABNORMAL HIGH (ref 70–99)

## 2012-10-27 NOTE — ED Notes (Signed)
cbg reads [id number]

## 2012-10-27 NOTE — ED Notes (Signed)
Pt c/o upper abd pain with nausea and vomiting.  Onset this am.  Pt denies any diarrhea.

## 2012-10-28 ENCOUNTER — Emergency Department (HOSPITAL_COMMUNITY)
Admission: EM | Admit: 2012-10-28 | Discharge: 2012-10-28 | Disposition: A | Discharge status: Home / Self Care | Payer: Self-pay | Attending: Emergency Medicine | Admitting: Emergency Medicine

## 2012-10-28 DIAGNOSIS — R1110 Vomiting, unspecified: Secondary | ICD-10-CM

## 2012-10-28 DIAGNOSIS — R109 Unspecified abdominal pain: Secondary | ICD-10-CM

## 2012-10-28 DIAGNOSIS — R739 Hyperglycemia, unspecified: Secondary | ICD-10-CM

## 2012-10-28 LAB — POCT I-STAT TROPONIN I: Troponin i, poc: 0 ng/mL (ref 0.00–0.08)

## 2012-10-28 LAB — GLUCOSE, CAPILLARY
Glucose-Capillary: 315 mg/dL — ABNORMAL HIGH (ref 70–99)
Glucose-Capillary: 272 mg/dL — ABNORMAL HIGH (ref 70–99)
Glucose-Capillary: 225 mg/dL — ABNORMAL HIGH (ref 70–99)

## 2012-10-28 LAB — LIPASE, BLOOD: Lipase: 34 U/L (ref 11–59)

## 2012-10-28 MED ORDER — ONDANSETRON HCL 4 MG/2ML IJ SOLN
4.0000 mg | Freq: Once | INTRAMUSCULAR | Status: AC
Start: 2012-10-28 — End: 2012-10-28
  Administered 2012-10-28: 4 mg via INTRAVENOUS
  Filled 2012-10-28: qty 2

## 2012-10-28 MED ORDER — SODIUM CHLORIDE 0.9 % IV BOLUS (SEPSIS)
1000.0000 mL | Freq: Once | INTRAVENOUS | Status: AC
  Administered 2012-10-28: 1000 mL via INTRAVENOUS

## 2012-10-28 MED ORDER — PROMETHAZINE HCL 25 MG RE SUPP: 25.0000 mg | Freq: Four times a day (QID) | RECTAL | Status: DC | PRN

## 2012-10-28 MED ORDER — LORAZEPAM 2 MG/ML IJ SOLN
1.0000 mg | Freq: Once | INTRAMUSCULAR | Status: AC
  Administered 2012-10-28: 1 mg via INTRAVENOUS
  Filled 2012-10-28: qty 1

## 2012-10-28 MED ORDER — ONDANSETRON HCL 4 MG PO TABS: 4.0000 mg | ORAL_TABLET | Freq: Four times a day (QID) | ORAL | Status: DC

## 2012-10-28 MED ORDER — INSULIN ASPART 100 UNIT/ML ~~LOC~~ SOLN
6.0000 [IU] | Freq: Once | SUBCUTANEOUS | Status: AC
  Administered 2012-10-28: 6 [IU] via INTRAVENOUS
  Filled 2012-10-28: qty 1

## 2012-10-28 MED ORDER — INSULIN ASPART 100 UNIT/ML ~~LOC~~ SOLN
5.0000 [IU] | Freq: Once | SUBCUTANEOUS | Status: AC
  Administered 2012-10-28: 5 [IU] via INTRAVENOUS
  Filled 2012-10-28: qty 1

## 2012-10-28 MED ORDER — SODIUM CHLORIDE 0.9 % IV BOLUS (SEPSIS): 1000.0000 mL | Freq: Once | INTRAVENOUS | Status: DC

## 2012-10-28 MED ORDER — INSULIN ASPART 100 UNIT/ML ~~LOC~~ SOLN
4.0000 [IU] | Freq: Once | SUBCUTANEOUS | Status: AC
  Administered 2012-10-28: 4 [IU] via INTRAVENOUS
  Filled 2012-10-28: qty 1

## 2012-10-28 NOTE — ED Notes (Signed)
Pt. Continues to be noncompliant with keeping arm straight to get fluids. Pt. Informed of need, verbalized understanding.

## 2012-10-28 NOTE — ED Provider Notes (Signed)
History     CSN: [id number]  Arrival date & time 10/27/12  2056   First MD Initiated Contact with Patient 10/28/12 0138      Chief Complaint  Patient presents with  . Abdominal Pain    (Consider location/radiation/quality/duration/timing/severity/associated sxs/prior treatment) Patient is a 37 y.o. female presenting with abdominal pain. The history is provided by the patient.  Abdominal Pain Pain location:  Epigastric Pain quality: aching   Pain radiates to:  Does not radiate Pain severity:  Moderate Onset quality:  Gradual Duration:  1 day Timing:  Constant Progression:  Worsening Chronicity:  New Context: not alcohol use   Relieved by:  Nothing Worsened by:  Nothing tried Associated symptoms: vomiting     Past Medical History  Diagnosis Date  . Diabetes mellitus without complication     History reviewed. No pertinent past surgical history.  No family history on file.  History  Substance Use Topics  . Smoking status: Never Smoker   . Smokeless tobacco: Not on file  . Alcohol Use: No    OB History   Grav Para Term Preterm Abortions TAB SAB Ect Mult Living                  Review of Systems  Gastrointestinal: Positive for vomiting and abdominal pain.  All other systems reviewed and are negative.    Allergies  Review of patient's allergies indicates no known allergies.  Home Medications   Current Outpatient Rx  Name  Route  Sig  Dispense  Refill  . metFORMIN (GLUCOPHAGE) 500 MG tablet   Oral   Take 500 mg by mouth 2 (two) times daily with a meal.         . Multiple Vitamin (MULTIVITAMIN WITH MINERALS) TABS   Oral   Take 1 tablet by mouth daily.           BP 189/111  Pulse 116  Temp(Src) 97.4 F (36.3 C) (Oral)  Resp 24  SpO2 100%  LMP 10/08/2012  Physical Exam  Constitutional: She is oriented to person, place, and time. She appears well-developed and well-nourished.  HENT:  Head: Normocephalic and atraumatic.  Eyes:  Conjunctivae and EOM are normal. Pupils are equal, round, and reactive to light.  Neck: Normal range of motion.  Cardiovascular: Regular rhythm and normal heart sounds.  Tachycardia present.   Pulmonary/Chest: Effort normal and breath sounds normal.  Abdominal: Soft. Bowel sounds are normal. There is tenderness in the epigastric area.    Musculoskeletal: Normal range of motion.  Neurological: She is alert and oriented to person, place, and time.  Skin: Skin is warm and dry.  Psychiatric: She has a normal mood and affect. Her behavior is normal.    ED Course  Procedures (including critical care time)  Labs Reviewed  CBC WITH DIFFERENTIAL - Abnormal; Notable for the following:    MCHC 36.5 (*)    Neutrophils Relative 87 (*)    Neutro Abs 9.1 (*)    Lymphocytes Relative 10 (*)    All other components within normal limits  COMPREHENSIVE METABOLIC PANEL - Abnormal; Notable for the following:    Potassium 3.4 (*)    Glucose, Bld 397 (*)    Albumin 3.2 (*)    All other components within normal limits  GLUCOSE, CAPILLARY - Abnormal; Notable for the following:    Glucose-Capillary 378 (*)    All other components within normal limits   No results found.   No diagnosis found.  MDM  + epigastric pain, vomiting,  Will ivf, lipase,  Insulin,  Ekg,  reassess        Chrisandra Wiemers Ferne Reus, MD 10/28/12 [phone]

## 2012-11-28 ENCOUNTER — Ambulatory Visit: Payer: Self-pay | Admitting: Radiation Oncology

## 2012-11-28 ENCOUNTER — Encounter: Payer: Self-pay | Admitting: Dietician

## 2012-12-05 ENCOUNTER — Ambulatory Visit (INDEPENDENT_AMBULATORY_CARE_PROVIDER_SITE_OTHER): Payer: BC Managed Care – PPO | Admitting: Radiation Oncology

## 2012-12-05 ENCOUNTER — Ambulatory Visit (INDEPENDENT_AMBULATORY_CARE_PROVIDER_SITE_OTHER): Payer: BC Managed Care – PPO | Admitting: Dietician

## 2012-12-05 ENCOUNTER — Encounter: Payer: Self-pay | Admitting: Radiation Oncology

## 2012-12-05 ENCOUNTER — Other Ambulatory Visit: Payer: Self-pay | Admitting: Dietician

## 2012-12-05 VITALS — BP 153/99 | HR 97 | Temp 98.0°F | Ht 66.5 in | Wt 168.8 lb

## 2012-12-05 DIAGNOSIS — E119 Type 2 diabetes mellitus without complications: Secondary | ICD-10-CM

## 2012-12-05 DIAGNOSIS — R030 Elevated blood-pressure reading, without diagnosis of hypertension: Secondary | ICD-10-CM

## 2012-12-05 DIAGNOSIS — F3289 Other specified depressive episodes: Secondary | ICD-10-CM

## 2012-12-05 DIAGNOSIS — IMO0001 Reserved for inherently not codable concepts without codable children: Secondary | ICD-10-CM

## 2012-12-05 DIAGNOSIS — F329 Major depressive disorder, single episode, unspecified: Secondary | ICD-10-CM

## 2012-12-05 DIAGNOSIS — R809 Proteinuria, unspecified: Secondary | ICD-10-CM

## 2012-12-05 DIAGNOSIS — F17200 Nicotine dependence, unspecified, uncomplicated: Secondary | ICD-10-CM

## 2012-12-05 LAB — LIPID PANEL
Cholesterol: 158 mg/dL (ref 0–200)
HDL: 51 mg/dL (ref >39)
LDL Cholesterol: 85 mg/dL (ref 0–99)
Total CHOL/HDL Ratio: 3.1 Ratio
Triglycerides: 110 mg/dL (ref <150)
VLDL: 22 mg/dL (ref 0–40)

## 2012-12-05 LAB — POCT GLYCOSYLATED HEMOGLOBIN (HGB A1C): Hemoglobin A1C: 11.4

## 2012-12-05 LAB — GLUCOSE, CAPILLARY: Glucose-Capillary: 373 mg/dL — ABNORMAL HIGH (ref 70–99)

## 2012-12-05 MED ORDER — METFORMIN HCL 500 MG PO TABS: 1000.0000 mg | ORAL_TABLET | Freq: Two times a day (BID) | ORAL | Status: DC

## 2012-12-05 MED ORDER — METFORMIN HCL 500 MG PO TABS: ORAL_TABLET | ORAL | Status: DC

## 2012-12-05 NOTE — Patient Instructions (Signed)
Take metformin as we discussed today: 1. Take 1 tablet (500mg ) of metformin every morning with breakfast for 7 days THEN 2.  take 1 tablet with breakfast and dinner for 7 days THEN 3.  take 2 tablets with breakfast and 1 tablet with dinner for 7 days THEN 4.  take 2 tablets twice a day.  We will see you back in 3 months to check on your diabetes. It was great to meet you and we are glad to have you back in the clinic. Have a great day.

## 2012-12-05 NOTE — Progress Notes (Signed)
Subjective:    Patient ID: Kathy Frank, female    DOB: Mar 17, 1976, 37 y.o.   MRN: [id number]  Diabetes Hypoglycemia symptoms include nervousness/anxiousness. Pertinent negatives for diabetes include no chest pain.   [name] is a 37yo woman with PMH significant for type 2 diabetes mellitus, depression, and tobacco abuse who presents to clinic today to establish care with a PCP. Patient states she has not been seen by a primary care physician for at least 3 years.   Diabetes mellitus: Her diabetes mellitus has gone untreated for 3 years or more, per her report, other than her taking metformin for one month after it was given to her on an acute visit to the ED earlier this year. When she was last seeing a doctor several years ago (pt unsure of physician's name), she states she was prescribed metformin 500mg  twice a day. Her most recent A1c available in EMR is from 08/2006, with A1c = 9.4 at that time.   Depression: She states that her depression is being managed by physicians at [facility]. She is currently taking 200 mg of Wellbutrin a day, and is taking trazodone 100 mg nightly as needed. She states that prior to 08/2012, she had never been on antidepressant medication previously,  and states that she was having more significant symptoms at that time which is why she finally pursued treatment (despite having mild dysphoria/depression-like symptoms in the past). She feels she is having an incomplete response to Wellbutrin. Her depressed mood is somewhat improved, however she continues to have decreased interest in daily activities and insomnia on a near nightly basis. She states she had feelings of suicidal ideation approximately one month ago, around the same time she had presented to the ED with complaints of abdominal pain which she feels were secondary to the severe anxiety. She she states that she now feels better, and that she has not been having any subsequent SI/HI since that time. She denies any  prior suicide attempts.  [name] states that she feels well today, and has no acute complaints. She smokes 1-2 cigarettes occasionally when having an alcoholic beverage, which she states is an infrequent occurrence. She denies any illicit drug use. LMP was ~1 week ago.   Review of Systems  Constitutional: Negative for fever and chills.  HENT: Negative.   Eyes: Negative.   Respiratory: Negative for cough and shortness of breath.   Cardiovascular: Negative for chest pain and leg swelling.  Gastrointestinal: Negative for nausea, vomiting, abdominal pain and diarrhea.  Endocrine: Negative.   Genitourinary: Negative for dysuria and hematuria.  Musculoskeletal: Negative.   Skin: Negative.   Allergic/Immunologic: Negative.   Neurological: Negative.   Hematological: Negative.   Psychiatric/Behavioral: Positive for sleep disturbance and dysphoric mood. Negative for suicidal ideas. The patient is nervous/anxious.        Objective:   Physical Exam  Constitutional: She is oriented to person, place, and time. She appears well-developed and well-nourished. No distress.  HENT:  Head: Normocephalic and atraumatic.  Eyes: Conjunctivae are normal. Pupils are equal, round, and reactive to light. No scleral icterus.  Neck: Normal range of motion. Neck supple. No tracheal deviation present.  Cardiovascular: Normal rate and regular rhythm.   No murmur heard. Pulmonary/Chest: She has no wheezes. She has no rales.  Abdominal: Soft. Bowel sounds are normal. She exhibits no distension. There is no tenderness.  Musculoskeletal: Normal range of motion. She exhibits no edema.  Neurological: She is alert and oriented to person, place, and  time. No cranial nerve deficit.  Skin: Skin is warm and dry. No erythema.  Psychiatric: Her speech is normal. Judgment normal. She is withdrawn. Cognition and memory are normal. She exhibits a depressed mood. She expresses no homicidal and no suicidal ideation. She is  inattentive.          Assessment & Plan:

## 2012-12-05 NOTE — Assessment & Plan Note (Signed)
  Assessment: Progress toward smoking cessation:    Barriers to progress toward smoking cessation:    Comments: pt smoking 1-2 cigarettes a week. Recently started on bupropion which she states is reducing her smoking frequency.   Plan: Instruction/counseling given:  I advised patient to stop smoking. Educational resources provided:    Self management tools provided:    Medications to assist with smoking cessation:   Patient agreed to the following self-care plans for smoking cessation:     Other plans: continue bupropion

## 2012-12-05 NOTE — Assessment & Plan Note (Signed)
Pt's depression is stable but appears she needs modification/intensification of her medication regimen given persistent complaints of depressive symptoms after 3-4 months of therapy. No changes were made to her home doses of bupropion (100mg  bid) or trazodone (100mg  qhs prn) during today's visit as this issue is being actively managed by her psychiatrist.  - cont management per [facility]

## 2012-12-05 NOTE — Telephone Encounter (Signed)
Patient provided with One touch Verio meter today and instructed on how to use it. She requests prescription for strips and lacnets

## 2012-12-05 NOTE — Assessment & Plan Note (Addendum)
Lab Results  Component Value Date   HGBA1C 11.4 12/05/2012   HGBA1C 9.4 09/09/2006     Assessment: Diabetes control: poor control (HgbA1C >9%) Progress toward A1C goal:  deteriorated Comments: A1c increased since last checked in 2008 secondary to medication non-compliance.  Plan: Medications:  Restart metformin at 500mg  qAM, increasing by 500mg  increments weekly until taking 1000mg  bid (pt instructed on the appropriate doses/frequencies). Home glucose monitoring: Frequency:   Timing:   Instruction/counseling given:  Educational resources provided:   Self management tools provided:   Other plans:  - f/u lipid panel results - f/u microalbumin/Cr results - recheck A1c in 3 months

## 2012-12-05 NOTE — Progress Notes (Signed)
Diabetes Self-Management Education  Visit Number: First/Initial  12/05/2012 [name], identified by name and date of birth, is a 37 y.o. female with Diabetes Type: Type 2.  Other people present during visit:    no  ASSESSMENT  Patient Concerns:   needs meter  and how to increase her medicine per doctor instructions  BMI in doctor visit today. Indicates overweight.  Lab Results: Hemoglobin A1C  Date Value Range Status  12/05/2012 11.4   Final  09/09/2006 9.4   Final     No family history on file. History  Substance Use Topics  . Smoking status: Former Smoker    Types: Cigarettes    Quit date: 08/10/2012  . Smokeless tobacco: Not on file  . Alcohol Use: No    Support Systems:   two daughter with diabetes, she sees CDE with daughters and counts carbs Special Needs:    no Prior DM Education:  yes Daily Foot Exams: Yes Patient Belief / Attitude about Diabetes:   diabetes can be controlled  Assessment comments: patient assisted learning how to use new meter. Will request rxs for strips and lancets    Diet Recall: reports she knows how to count carb for her daughter, she has trouble eating due to depression. Plan to discuss in more detail at follow up     Individualized Plan for Diabetes Self-Management Training:  Patient individualized diabetes plan discussed today with patient and includes: medications, monitoring, how to handle highs and lows, Special care for my body when I have diabetes, Dealing daily with diabetes  Education Topics Reviewed with Patient Today:  Topic Points Discussed  Disease State    Nutrition Management    Physical Activity and Exercise    Medications Reviewed patients medication for diabetes, action, purpose, timing of dose and side effects.;provided calendar to help patient know how to increase her metformin  Monitoring Taught/evaluated SMBG with One touch verio meter.;Purpose and frequency of SMBG.;Taught/discussed recording of test  results and interpretation of SMBG.  Acute Complications    Chronic Complications    Psychosocial Adjustment    Goal Setting    Preconception Care (if applicable)      PATIENTS GOALS   Learning Objective(s):     Goal The patient agrees to:  Nutrition    Physical Activity    Medications    Monitoring test blood glucose pre and post meals as discussed;mark as before or after meal periodically  Problem Solving    Reducing Risk    Health Coping     Patient Self-Evaluation of Goals (Subsequent Visits)  Goal The patient rates self as meeting goals (% of time)  Nutrition    Physical Activity    Medications    Monitoring    Problem Solving    Reducing Risk    Health Coping       PERSONALIZED PLAN / SUPPORT  Self-Management Support:  Kathy Frank office;Family;CDE visits ______________________________________________________________________  Outcomes  Expected Outcomes:  Demonstrated interest in learning. Expect positive outcomes Self-Care Barriers:  None;Lack of material resources Education material provided: yes If problems or questions, patient to contact team via:  Phone Time in: 1130     Time out: 1200 Future DSME appointment: - 4-6 wks   [name], Butch Penny 12/05/2012 12:19 PM

## 2012-12-06 LAB — MICROALBUMIN / CREATININE URINE RATIO
Creatinine, Urine: 218.6 mg/dL
Microalb Creat Ratio: 1629.1 mg/g — ABNORMAL HIGH (ref 0.0–30.0)
Microalb, Ur: 356.13 mg/dL — ABNORMAL HIGH (ref 0.00–1.89)

## 2012-12-06 MED ORDER — GLUCOSE BLOOD VI STRP: ORAL_STRIP | Status: DC

## 2012-12-06 MED ORDER — ONETOUCH VERIO IQ SYSTEM W/DEVICE KIT: 1.0000 | PACK | Freq: Two times a day (BID) | Status: DC | PRN

## 2012-12-06 MED ORDER — ONETOUCH DELICA LANCETS FINE MISC: 1.0000 | Freq: Two times a day (BID) | Status: DC

## 2012-12-06 NOTE — Progress Notes (Signed)
Case discussed with Dr. Para Skeans soon after the visit. We reviewed the resident's history and exam and pertinent patient test results. I agree with the assessment, diagnosis and plan of care documented in the resident's note.

## 2012-12-07 DIAGNOSIS — E1121 Type 2 diabetes mellitus with diabetic nephropathy: Secondary | ICD-10-CM | POA: Insufficient documentation

## 2012-12-07 MED ORDER — LISINOPRIL 10 MG PO TABS: 10.0000 mg | ORAL_TABLET | Freq: Every day | ORAL | Status: DC

## 2012-12-07 NOTE — Assessment & Plan Note (Addendum)
Results of microalbumin/creatinine ratio reveal that pt has significant proteinuria. Will initiate ACEi therapy at this time--to note, pt insists she is not pregnant and LMP ~1 week ago.  - start lisinopril 10mg  (may need titration to a higher dose) - check BMET in 1-2 weeks

## 2012-12-07 NOTE — Addendum Note (Signed)
Addended by: Jeani Hawking R on: 12/07/2012 10:50 AM   Modules accepted: Orders

## 2012-12-13 ENCOUNTER — Telehealth: Payer: Self-pay | Admitting: *Deleted

## 2012-12-13 NOTE — Telephone Encounter (Signed)
Pt calls upset that lisinopril at [facility] for whatever the reason was going to be 180.00, i called [facility] and it is 4.00, pt will be transferring all meds to [facility]

## 2012-12-19 ENCOUNTER — Other Ambulatory Visit: Payer: BC Managed Care – PPO

## 2013-01-17 ENCOUNTER — Encounter: Payer: Self-pay | Admitting: Dietician

## 2013-02-16 ENCOUNTER — Other Ambulatory Visit: Payer: Self-pay

## 2013-03-16 ENCOUNTER — Emergency Department (INDEPENDENT_AMBULATORY_CARE_PROVIDER_SITE_OTHER)
Admission: EM | Admit: 2013-03-16 | Discharge: 2013-03-16 | Disposition: A | Discharge status: Home / Self Care | Payer: BC Managed Care – PPO | Source: Home / Self Care | Attending: Emergency Medicine | Admitting: Emergency Medicine

## 2013-03-16 ENCOUNTER — Encounter (HOSPITAL_COMMUNITY): Payer: Self-pay | Admitting: Emergency Medicine

## 2013-03-16 DIAGNOSIS — R21 Rash and other nonspecific skin eruption: Secondary | ICD-10-CM

## 2013-03-16 MED ORDER — HYDROCORTISONE ACE-PRAMOXINE 2.5-1 % EX CREA: TOPICAL_CREAM | CUTANEOUS | Status: DC

## 2013-03-16 NOTE — ED Provider Notes (Signed)
CSN: [id number]     Arrival date & time 03/16/13  1435 History     First MD Initiated Contact with Patient 03/16/13 1624     Chief Complaint  Patient presents with  . Rash   (Consider location/radiation/quality/duration/timing/severity/associated sxs/prior Treatment) HPI Comments: 37 year old female presents for evaluation of a rash that has been present for 4 days. This rash started on the palms of her hands and has since spread up her arms. Rash is itchy and looks like blisters. She has been putting calamine lotion on this which has not been helping at all. She denies any known exposure to poison ivy, or close contacts with a similar rash. She denies any new medications. Denies any other associated symptoms including no fever, sore throat, rash elsewhere. No history of a similar rash  Patient is a 37 y.o. female presenting with rash.  Rash Associated symptoms: no chest pain, no chills, no cough, no dysuria, no fever, no nausea, no shortness of breath and no vomiting     Past Medical History  Diagnosis Date  . Diabetes mellitus without complication   . Tobacco abuse    History reviewed. No pertinent past surgical history. History reviewed. No pertinent family history. History  Substance Use Topics  . Smoking status: Former Smoker    Types: Cigarettes    Quit date: 08/10/2012  . Smokeless tobacco: Not on file  . Alcohol Use: No   OB History   Grav Para Term Preterm Abortions TAB SAB Ect Mult Living                 Review of Systems  Constitutional: Negative for fever and chills.  Eyes: Negative for visual disturbance.  Respiratory: Negative for cough and shortness of breath.   Cardiovascular: Negative for chest pain, palpitations and leg swelling.  Gastrointestinal: Negative for nausea, vomiting and abdominal pain.  Endocrine: Negative for polydipsia and polyuria.  Genitourinary: Negative for dysuria, urgency and frequency.  Musculoskeletal: Negative for myalgias and  arthralgias.  Skin: Positive for rash.  Neurological: Negative for dizziness, weakness and light-headedness.    Allergies  Review of patient's allergies indicates no known allergies.  Home Medications   Current Outpatient Rx  Name  Route  Sig  Dispense  Refill  . Blood Glucose Monitoring Suppl (ONETOUCH VERIO IQ SYSTEM) W/DEVICE KIT   Does not apply   1 each by Does not apply route 2 (two) times daily as needed.   1 kit   0   . buPROPion (WELLBUTRIN) 100 MG tablet   Oral   Take 100 mg by mouth 2 (two) times daily.         Marland Kitchen glucose blood (ONETOUCH VERIO) test strip      Check blood sugar twice daily as instructed. 250.02   100 each   12   . lisinopril (PRINIVIL,ZESTRIL) 10 MG tablet   Oral   Take 1 tablet (10 mg total) by mouth daily.   30 tablet   5   . metFORMIN (GLUCOPHAGE) 500 MG tablet      Take 1 tab (500mg ) qAM x 7d, then 1 tab bid x 7d, then 2 tabs qam and 1 tab qpm, then 2 tabs bid.   70 tablet   0   . metFORMIN (GLUCOPHAGE) 500 MG tablet   Oral   Take 2 tablets (1,000 mg total) by mouth 2 (two) times daily with a meal.   120 tablet   5   . [place]  Does not apply   1 each by Does not apply route 2 (two) times daily.   100 each   12   . traZODone (DESYREL) 100 MG tablet   Oral   Take 100 mg by mouth at bedtime.         . Multiple Vitamin (MULTIVITAMIN WITH MINERALS) TABS   Oral   Take 1 tablet by mouth daily.         . Pramoxine-HC (HYDROCORTISONE ACE-PRAMOXINE) 2.5-1 % CREA      Apply to rash 2-3 times daily PRN   57 g   1    BP 133/86  Pulse 86  Temp(Src) 97.4 F (36.3 C) (Oral)  Resp 18  SpO2 96%  LMP 03/10/2013 Physical Exam  Nursing note and vitals reviewed. Constitutional: She is oriented to person, place, and time. Vital signs are normal. She appears well-developed and well-nourished. No distress.  HENT:  Head: Normocephalic and atraumatic.  Mouth/Throat: Oropharynx is clear and moist. No  oropharyngeal exudate.  Eyes: EOM are normal. Pupils are equal, round, and reactive to light.  Cardiovascular: Normal rate, regular rhythm and normal heart sounds.  Exam reveals no gallop and no friction rub.   No murmur heard. Pulmonary/Chest: Effort normal and breath sounds normal. No respiratory distress. She has no wheezes. She has no rales.  Neurological: She is alert and oriented to person, place, and time. She has normal strength.  Skin: Skin is warm and dry. Rash noted. Rash is papular (vesicular, firm, nontender, flesh colored papules on the palms and distal forearms) and vesicular (3 small vesicles. These are located one between her toes, and one in each on her hands between her fingers). She is not diaphoretic.  Psychiatric: She has a normal mood and affect. Her behavior is normal. Judgment normal.    ED Course   Procedures (including critical care time)  Labs Reviewed - No data to display No results found. 1. Rash     MDM  And these appear to be simple warts, however she swears that they were not there at all 4 days ago. We'll treat this with pramoxine HC, will followup either here or with her primary care physician if this does not improve.   Meds ordered this encounter  Medications  . Pramoxine-HC (HYDROCORTISONE ACE-PRAMOXINE) 2.5-1 % CREA    Sig: Apply to rash 2-3 times daily PRN    Dispense:  57 g    Refill:  [place] Ekansh Sherk, PA-C 03/16/13 2138

## 2013-03-16 NOTE — ED Notes (Signed)
C/o rash which started four days ago.  Patient states she thought it was poison ivy since she was doing yard work.  Hydrocortisone cream and a&d ointment used but no relief.

## 2013-03-21 NOTE — ED Provider Notes (Signed)
Medical screening examination/treatment/procedure(s) were performed by non-physician practitioner and as supervising physician I was immediately available for consultation/collaboration.  Philipp Deputy, M.D.  Harden Mo, MD 03/21/13 2132

## 2013-04-17 ENCOUNTER — Other Ambulatory Visit: Payer: Self-pay | Admitting: Internal Medicine

## 2013-05-13 ENCOUNTER — Encounter (HOSPITAL_COMMUNITY): Payer: Self-pay | Admitting: *Deleted

## 2013-05-13 ENCOUNTER — Emergency Department (HOSPITAL_COMMUNITY)
Admission: EM | Admit: 2013-05-13 | Discharge: 2013-05-13 | Disposition: A | Discharge status: Home / Self Care | Payer: BC Managed Care – PPO | Attending: Emergency Medicine | Admitting: Emergency Medicine

## 2013-05-13 DIAGNOSIS — L02519 Cutaneous abscess of unspecified hand: Secondary | ICD-10-CM | POA: Insufficient documentation

## 2013-05-13 DIAGNOSIS — L02511 Cutaneous abscess of right hand: Secondary | ICD-10-CM

## 2013-05-13 DIAGNOSIS — R21 Rash and other nonspecific skin eruption: Secondary | ICD-10-CM | POA: Insufficient documentation

## 2013-05-13 DIAGNOSIS — Z87891 Personal history of nicotine dependence: Secondary | ICD-10-CM | POA: Insufficient documentation

## 2013-05-13 DIAGNOSIS — E119 Type 2 diabetes mellitus without complications: Secondary | ICD-10-CM | POA: Insufficient documentation

## 2013-05-13 DIAGNOSIS — L299 Pruritus, unspecified: Secondary | ICD-10-CM | POA: Insufficient documentation

## 2013-05-13 DIAGNOSIS — B86 Scabies: Secondary | ICD-10-CM

## 2013-05-13 DIAGNOSIS — Z79899 Other long term (current) drug therapy: Secondary | ICD-10-CM | POA: Insufficient documentation

## 2013-05-13 MED ORDER — PERMETHRIN 5 % EX CREA: TOPICAL_CREAM | CUTANEOUS | Status: DC

## 2013-05-13 NOTE — ED Notes (Signed)
Patient discharged to home with family. NAD.  

## 2013-05-13 NOTE — ED Provider Notes (Signed)
Medical screening examination/treatment/procedure(s) were performed by non-physician practitioner and as supervising physician I was immediately available for consultation/collaboration.   Hoy Morn, MD 05/13/13 [phone]

## 2013-05-13 NOTE — ED Provider Notes (Signed)
CSN: [id number]     Arrival date & time 05/13/13  0606 History   First MD Initiated Contact with Patient 05/13/13 [phone]     Chief Complaint  Patient presents with  . Hand Pain  . Pruritis   (Consider location/radiation/quality/duration/timing/severity/associated sxs/prior Treatment) HPI Comments: Patient presents with 2 complaints. She has had a full-body, itchy rash for 2 months. She lives at home with her kids who do not have a similar rash. She was seen at urgent care 2 months ago and prescribed a steroid cream which has not helped. Patient denies new exposures to skin or medications. She denies tick bite. She denies fever, headache, or neck pain. She denies any sores in her mouth or redness of her eyes. No nausea or vomiting. No other treatments.  Patient also has an abscess on her right hand for the past 3 days. It has not been draining. It is painful. Does not take anything for this. No difficulty moving her hand, wrist, fingers. Onset of symptoms gradual. Course is constant. Nothing makes symptoms better or worse.  Patient is a 37 y.o. female presenting with hand pain. The history is provided by the patient.  Hand Pain Associated symptoms include a rash. Pertinent negatives include no fever, nausea or vomiting.    Past Medical History  Diagnosis Date  . Diabetes mellitus without complication   . Tobacco abuse    History reviewed. No pertinent past surgical history. No family history on file. History  Substance Use Topics  . Smoking status: Former Smoker    Types: Cigarettes    Quit date: 08/10/2012  . Smokeless tobacco: Not on file  . Alcohol Use: No   OB History   Grav Para Term Preterm Abortions TAB SAB Ect Mult Living                 Review of Systems  Constitutional: Negative for fever.  Gastrointestinal: Negative for nausea and vomiting.  Skin: Positive for rash. Negative for color change.       Positive for abscess.  Hematological: Negative for adenopathy.     Allergies  Review of patient's allergies indicates no known allergies.  Home Medications   Current Outpatient Rx  Name  Route  Sig  Dispense  Refill  . Aspirin-Salicylamide-Caffeine (BC HEADACHE) 325-95-16 MG TABS   Oral   Take 1 Package by mouth daily as needed (for pain).         . metFORMIN (GLUCOPHAGE) 500 MG tablet   Oral   Take 2 tablets (1,000 mg total) by mouth 2 (two) times daily with a meal.   120 tablet   5    BP 162/90  Pulse 98  Temp(Src) 98.5 F (36.9 C) (Oral)  Resp 20  SpO2 100%  LMP 05/11/2013 Physical Exam  Nursing note and vitals reviewed. Constitutional: She appears well-developed and well-nourished.  HENT:  Head: Normocephalic and atraumatic.  Eyes: Conjunctivae are normal. Right eye exhibits no discharge. Left eye exhibits no discharge.  Neck: Normal range of motion. Neck supple.  Cardiovascular: Normal rate, regular rhythm and normal heart sounds.   Pulmonary/Chest: Effort normal and breath sounds normal.  Abdominal: Soft. There is no tenderness.  Musculoskeletal:  Patient with full active range of motion of right digits, hand, and wrist without pain.  Neurological: She is alert.  Skin: Skin is warm and dry. Rash noted.  Patients with a full-body, punctate lesions consistent with scabies. More pronounced in web spaces of fingers and sides of abdomen.  1 cm x 1.5 cm blister to thenar eminence of right hand filled with purulent fluid.  Psychiatric: She has a normal mood and affect.    ED Course  Procedures (including critical care time) Labs Review Labs Reviewed - No data to display Imaging Review No results found.  7:05 AM Patient seen and examined. Counseled on care for scabies. Patient agrees to proceed with I&D of abscess.    Vital signs reviewed and are as follows: Filed Vitals:   05/13/13 0611  BP: 162/90  Pulse: 98  Temp: 98.5 F (36.9 C)  Resp: 20   INCISION AND DRAINAGE Performed by: Faustino Congress Consent: Verbal  consent obtained. Risks and benefits: risks, benefits and alternatives were discussed Type: abscess  Body area: R hand  Anesthesia: local infiltration  Incision was made with a 25g needle.  Local anesthetic: lidocaine 2% with epinephrine  Anesthetic total: 1 ml  Complexity: complex Manual pressure to break up loculations  Drainage: purulent  Drainage amount: moderate  Packing material: none  Patient tolerance: Patient tolerated the procedure well with no immediate complications.  7:07 AM The patient was urged to return to the Emergency Department urgently with worsening pain, swelling, expanding erythema especially if it streaks away from the affected area, fever, or if they have any other concerns.   The patient was urged to return to the Emergency Department or go to their PCP in 48 hours for wound recheck if the area is not significantly improved.  The patient verbalized understanding and stated agreement with this plan.   MDM   1. Abscess of right hand excluding fingers and thumb   2. Scabies    Rash consistent with scabies. No fever or systemic symptoms of illness. No new exposures/tick bites. Do not suspect SJS/TEN. Patient appears well, nontoxic.  Abscess: Drainage performed. No cellulitis. Antibiotics not indicated as the area is small. No dysfunction of hand.    Carlisle Cater, PA-C 05/13/13 [phone]

## 2013-05-13 NOTE — ED Notes (Signed)
Rt. Thumb, distal -- abscess, skin intact and painful. Pt. C/o of itching, "bumps" over body. Went to [facility] and told it was a rash and prescribed a cream.

## 2013-06-02 ENCOUNTER — Encounter: Payer: Self-pay | Admitting: Internal Medicine

## 2013-07-28 ENCOUNTER — Emergency Department (HOSPITAL_COMMUNITY)
Admission: EM | Admit: 2013-07-28 | Discharge: 2013-07-28 | Disposition: A | Discharge status: Home / Self Care | Payer: BC Managed Care – PPO | Attending: Emergency Medicine | Admitting: Emergency Medicine

## 2013-07-28 ENCOUNTER — Emergency Department (HOSPITAL_COMMUNITY): Payer: BC Managed Care – PPO

## 2013-07-28 ENCOUNTER — Encounter (HOSPITAL_COMMUNITY): Payer: Self-pay | Admitting: Emergency Medicine

## 2013-07-28 DIAGNOSIS — Z79899 Other long term (current) drug therapy: Secondary | ICD-10-CM | POA: Insufficient documentation

## 2013-07-28 DIAGNOSIS — Z87891 Personal history of nicotine dependence: Secondary | ICD-10-CM | POA: Insufficient documentation

## 2013-07-28 DIAGNOSIS — R1012 Left upper quadrant pain: Secondary | ICD-10-CM | POA: Insufficient documentation

## 2013-07-28 DIAGNOSIS — R112 Nausea with vomiting, unspecified: Secondary | ICD-10-CM | POA: Insufficient documentation

## 2013-07-28 DIAGNOSIS — R6883 Chills (without fever): Secondary | ICD-10-CM | POA: Insufficient documentation

## 2013-07-28 DIAGNOSIS — R630 Anorexia: Secondary | ICD-10-CM | POA: Insufficient documentation

## 2013-07-28 DIAGNOSIS — Z3202 Encounter for pregnancy test, result negative: Secondary | ICD-10-CM | POA: Insufficient documentation

## 2013-07-28 DIAGNOSIS — Z8659 Personal history of other mental and behavioral disorders: Secondary | ICD-10-CM | POA: Insufficient documentation

## 2013-07-28 DIAGNOSIS — E119 Type 2 diabetes mellitus without complications: Secondary | ICD-10-CM | POA: Insufficient documentation

## 2013-07-28 DIAGNOSIS — R739 Hyperglycemia, unspecified: Secondary | ICD-10-CM

## 2013-07-28 DIAGNOSIS — R109 Unspecified abdominal pain: Secondary | ICD-10-CM

## 2013-07-28 DIAGNOSIS — R1013 Epigastric pain: Secondary | ICD-10-CM | POA: Insufficient documentation

## 2013-07-28 HISTORY — DX: Major depressive disorder, single episode, unspecified: F32.9

## 2013-07-28 LAB — GLUCOSE, CAPILLARY
Glucose-Capillary: 444 mg/dL — ABNORMAL HIGH (ref 70–99)
Glucose-Capillary: 281 mg/dL — ABNORMAL HIGH (ref 70–99)

## 2013-07-28 LAB — CBC WITH DIFFERENTIAL/PLATELET
Basophils Absolute: 0 10*3/uL (ref 0.0–0.1)
Basophils Relative: 0 % (ref 0–1)
Eosinophils Absolute: 0 10*3/uL (ref 0.0–0.7)
Eosinophils Relative: 0 % (ref 0–5)
HCT: 36.6 % (ref 36.0–46.0)
Hemoglobin: 12.7 g/dL (ref 12.0–15.0)
Lymphocytes Relative: 8 % — ABNORMAL LOW (ref 12–46)
Lymphs Abs: 0.8 10*3/uL (ref 0.7–4.0)
MCH: 28.3 pg (ref 26.0–34.0)
MCHC: 34.7 g/dL (ref 30.0–36.0)
MCV: 81.7 fL (ref 78.0–100.0)
Monocytes Absolute: 0.4 10*3/uL (ref 0.1–1.0)
Monocytes Relative: 4 % (ref 3–12)
Neutro Abs: 9.2 10*3/uL — ABNORMAL HIGH (ref 1.7–7.7)
Neutrophils Relative %: 89 % — ABNORMAL HIGH (ref 43–77)
Platelets: 289 10*3/uL (ref 150–400)
RBC: 4.48 MIL/uL (ref 3.87–5.11)
RDW: 12.7 % (ref 11.5–15.5)
WBC: 10.5 10*3/uL (ref 4.0–10.5)

## 2013-07-28 LAB — COMPREHENSIVE METABOLIC PANEL
ALT: 12 U/L (ref 0–35)
AST: 13 U/L (ref 0–37)
Albumin: 2.9 g/dL — ABNORMAL LOW (ref 3.5–5.2)
Alkaline Phosphatase: 91 U/L (ref 39–117)
BUN: 17 mg/dL (ref 6–23)
CO2: 20 mEq/L (ref 19–32)
Calcium: 9.1 mg/dL (ref 8.4–10.5)
Chloride: 95 mEq/L — ABNORMAL LOW (ref 96–112)
Creatinine, Ser: 0.85 mg/dL (ref 0.50–1.10)
GFR calc Af Amer: >90 mL/min (ref >90)
GFR calc non Af Amer: 87 mL/min — ABNORMAL LOW (ref >90)
Glucose, Bld: 515 mg/dL — ABNORMAL HIGH (ref 70–99)
Potassium: 3.5 mEq/L (ref 3.5–5.1)
Sodium: 134 mEq/L — ABNORMAL LOW (ref 135–145)
Total Bilirubin: 0.5 mg/dL (ref 0.3–1.2)
Total Protein: 7.9 g/dL (ref 6.0–8.3)

## 2013-07-28 LAB — URINALYSIS, ROUTINE W REFLEX MICROSCOPIC
Bilirubin Urine: NEGATIVE
Glucose, UA: >1000 mg/dL — AB
Ketones, ur: 40 mg/dL — AB
Leukocytes, UA: NEGATIVE
Nitrite: NEGATIVE
Protein, ur: >300 mg/dL — AB
Specific Gravity, Urine: 1.031 — ABNORMAL HIGH (ref 1.005–1.030)
Urobilinogen, UA: 0.2 mg/dL (ref 0.0–1.0)
pH: 6.5 (ref 5.0–8.0)

## 2013-07-28 LAB — URINE MICROSCOPIC-ADD ON

## 2013-07-28 LAB — LIPASE, BLOOD: Lipase: 40 U/L (ref 11–59)

## 2013-07-28 LAB — PREGNANCY, URINE: Preg Test, Ur: NEGATIVE

## 2013-07-28 MED ORDER — PROCHLORPERAZINE EDISYLATE 5 MG/ML IJ SOLN
10.0000 mg | Freq: Once | INTRAMUSCULAR | Status: AC
  Administered 2013-07-28: 10 mg via INTRAVENOUS
  Filled 2013-07-28: qty 2

## 2013-07-28 MED ORDER — MORPHINE SULFATE 4 MG/ML IJ SOLN
4.0000 mg | Freq: Once | INTRAMUSCULAR | Status: AC
  Administered 2013-07-28: 4 mg via INTRAVENOUS
  Filled 2013-07-28: qty 1

## 2013-07-28 MED ORDER — PROMETHAZINE HCL 25 MG PO TABS: 25.0000 mg | ORAL_TABLET | Freq: Four times a day (QID) | ORAL | Status: DC | PRN

## 2013-07-28 MED ORDER — INSULIN ASPART 100 UNIT/ML ~~LOC~~ SOLN
10.0000 [IU] | Freq: Once | SUBCUTANEOUS | Status: AC
  Administered 2013-07-28: 10 [IU] via INTRAVENOUS
  Filled 2013-07-28: qty 1

## 2013-07-28 MED ORDER — ONDANSETRON HCL 4 MG/2ML IJ SOLN
4.0000 mg | Freq: Once | INTRAMUSCULAR | Status: AC
  Administered 2013-07-28: 4 mg via INTRAVENOUS
  Filled 2013-07-28: qty 2

## 2013-07-28 MED ORDER — SODIUM CHLORIDE 0.9 % IV BOLUS (SEPSIS)
1000.0000 mL | Freq: Once | INTRAVENOUS | Status: AC
  Administered 2013-07-28: 1000 mL via INTRAVENOUS

## 2013-07-28 NOTE — ED Notes (Signed)
Pt states she woke up at 0600 yesterday morning and had a pain in her upper abd. Pt states she tried to eat something and started vomiting and has been unable to keep anything down ever since. Pt reports diaphoresis and chills. Denies fever or diarrhea.

## 2013-07-28 NOTE — ED Provider Notes (Signed)
CSN: [id number]     Arrival date & time 07/28/13  0725 History   First MD Initiated Contact with Patient 07/28/13 0732     Chief Complaint  Patient presents with  . Abdominal Pain  . Emesis   (Consider location/radiation/quality/duration/timing/severity/associated sxs/prior Treatment) Patient is a 37 y.o. female presenting with abdominal pain and vomiting. The history is provided by the patient.  Abdominal Pain Associated symptoms: chills, nausea and vomiting   Associated symptoms: no chest pain, no diarrhea and no shortness of breath   Emesis Associated symptoms: abdominal pain and chills   Associated symptoms: no diarrhea and no headaches    patient developed upper abdominal pain with nausea vomiting yesterday morning. She's had some chills. No fevers. No diarrhea. She states she's been around some other people and throwing up. No headache. No confusion. The pain is constant. It is in her upper abdomen.  Past Medical History  Diagnosis Date  . Diabetes mellitus without complication   . Tobacco abuse   . Depression    History reviewed. No pertinent past surgical history. History reviewed. No pertinent family history. History  Substance Use Topics  . Smoking status: Former Smoker    Types: Cigarettes    Quit date: 08/10/2012  . Smokeless tobacco: Not on file  . Alcohol Use: Yes     Comment: "occasionally"   OB History   Grav Para Term Preterm Abortions TAB SAB Ect Mult Living                 Review of Systems  Constitutional: Positive for chills and appetite change. Negative for activity change.  Eyes: Negative for pain.  Respiratory: Negative for chest tightness and shortness of breath.   Cardiovascular: Negative for chest pain and leg swelling.  Gastrointestinal: Positive for nausea, vomiting and abdominal pain. Negative for diarrhea.  Genitourinary: Negative for flank pain.  Musculoskeletal: Negative for back pain and neck stiffness.  Skin: Negative for rash.   Neurological: Negative for weakness, numbness and headaches.  Psychiatric/Behavioral: Negative for behavioral problems.    Allergies  Review of patient's allergies indicates no known allergies.  Home Medications   Current Outpatient Rx  Name  Route  Sig  Dispense  Refill  . Aspirin-Salicylamide-Caffeine (BC HEADACHE) 325-95-16 MG TABS   Oral   Take 1 Package by mouth daily as needed (for pain).         . metFORMIN (GLUCOPHAGE) 500 MG tablet   Oral   Take 2 tablets (1,000 mg total) by mouth 2 (two) times daily with a meal.   120 tablet   5   . promethazine (PHENERGAN) 25 MG tablet   Oral   Take 1 tablet (25 mg total) by mouth every 6 (six) hours as needed for nausea.   20 tablet   0    BP 127/68  Pulse 106  Temp(Src) 98.4 F (36.9 C) (Oral)  Resp 15  SpO2 99%  LMP 07/05/2013 Physical Exam  Nursing note and vitals reviewed. Constitutional: She is oriented to person, place, and time. She appears well-developed and well-nourished.  Patient appears uncomfortable.  HENT:  Head: Normocephalic and atraumatic.  Eyes: EOM are normal. Pupils are equal, round, and reactive to light.  Neck: Normal range of motion. Neck supple.  Cardiovascular: Normal rate, regular rhythm and normal heart sounds.   No murmur heard. Pulmonary/Chest: Effort normal and breath sounds normal. No respiratory distress. She has no wheezes. She has no rales.  Abdominal: Soft. Bowel sounds are normal. She  exhibits no distension. There is tenderness. There is no rebound and no guarding.  Epigastric to left upper quadrant tenderness without rebound or guarding.  Musculoskeletal: Normal range of motion.  Neurological: She is alert and oriented to person, place, and time. No cranial nerve deficit.  Skin: Skin is warm and dry.  Psychiatric: She has a normal mood and affect. Her speech is normal.    ED Course  Procedures (including critical care time) Labs Review Labs Reviewed  CBC WITH DIFFERENTIAL -  Abnormal; Notable for the following:    Neutrophils Relative % 89 (*)    Neutro Abs 9.2 (*)    Lymphocytes Relative 8 (*)    All other components within normal limits  COMPREHENSIVE METABOLIC PANEL - Abnormal; Notable for the following:    Sodium 134 (*)    Chloride 95 (*)    Glucose, Bld 515 (*)    Albumin 2.9 (*)    GFR calc non Af Amer 87 (*)    All other components within normal limits  URINALYSIS, ROUTINE W REFLEX MICROSCOPIC - Abnormal; Notable for the following:    Specific Gravity, Urine 1.031 (*)    Glucose, UA >1000 (*)    Hgb urine dipstick MODERATE (*)    Ketones, ur 40 (*)    Protein, ur >300 (*)    All other components within normal limits  GLUCOSE, CAPILLARY - Abnormal; Notable for the following:    Glucose-Capillary 444 (*)    All other components within normal limits  GLUCOSE, CAPILLARY - Abnormal; Notable for the following:    Glucose-Capillary 281 (*)    All other components within normal limits  LIPASE, BLOOD  PREGNANCY, URINE  URINE MICROSCOPIC-ADD ON   Imaging Review US Abdomen Complete  07/28/2013   CLINICAL DATA:  Epigastric pain.  Diabetes.  EXAM: ULTRASOUND ABDOMEN COMPLETE  COMPARISON:  None.  FINDINGS: Gallbladder:  No gallstones or wall thickening visualized. No sonographic Murphy sign noted.  Common bile duct:  Diameter: 3 mm  Liver:  No focal lesion identified. Within normal limits in parenchymal echogenicity.  IVC:  No abnormality visualized.  Pancreas:  Visualized portion unremarkable.  Spleen:  Size and appearance within normal limits.  Right Kidney:  Length: 11.6 cm. Echogenicity within normal limits. No mass or hydronephrosis visualized.  Left Kidney:  Length: 11.8 cm. Echogenicity within normal limits. No mass or hydronephrosis visualized.  Abdominal aorta:  No aneurysm visualized.  Other findings:  None.  IMPRESSION: Negative. No evidence of gallstones, biliary dilatation, or other acute findings.   Electronically Signed   By: Earle Gell M.D.    On: 07/28/2013 10:53    EKG Interpretation   None       MDM   1. Nausea and vomiting   2. Abdominal pain   3. Hyperglycemia    Patient with nausea vomiting abdominal pain. Doubt obstruction. Laboratory reassuring except for hyperglycemia. Patient feels much better after IV fluids and insulin. Sugars come down. She's tolerated orals will be discharged home follow up with her primary care Dr.    Jasper Riling. Alvino Chapel, MD 07/28/13 [phone]

## 2013-07-31 LAB — GLUCOSE, CAPILLARY: Glucose-Capillary: 399 mg/dL — ABNORMAL HIGH (ref 70–99)

## 2013-09-28 ENCOUNTER — Encounter: Payer: Self-pay | Admitting: Internal Medicine

## 2013-11-12 ENCOUNTER — Encounter (HOSPITAL_COMMUNITY): Payer: Self-pay | Admitting: Emergency Medicine

## 2013-11-12 ENCOUNTER — Inpatient Hospital Stay (HOSPITAL_COMMUNITY)
Admission: EM | Admit: 2013-11-12 | Discharge: 2013-11-16 | DRG: 074 | Disposition: A | Discharge status: Home / Self Care | Payer: BC Managed Care – PPO | Attending: Internal Medicine | Admitting: Internal Medicine

## 2013-11-12 DIAGNOSIS — E1149 Type 2 diabetes mellitus with other diabetic neurological complication: Principal | ICD-10-CM | POA: Diagnosis present

## 2013-11-12 DIAGNOSIS — IMO0002 Reserved for concepts with insufficient information to code with codable children: Secondary | ICD-10-CM

## 2013-11-12 DIAGNOSIS — E1165 Type 2 diabetes mellitus with hyperglycemia: Secondary | ICD-10-CM

## 2013-11-12 DIAGNOSIS — B3781 Candidal esophagitis: Secondary | ICD-10-CM | POA: Diagnosis present

## 2013-11-12 DIAGNOSIS — Z91199 Patient's noncompliance with other medical treatment and regimen due to unspecified reason: Secondary | ICD-10-CM

## 2013-11-12 DIAGNOSIS — I10 Essential (primary) hypertension: Secondary | ICD-10-CM | POA: Diagnosis present

## 2013-11-12 DIAGNOSIS — R1013 Epigastric pain: Secondary | ICD-10-CM | POA: Diagnosis present

## 2013-11-12 DIAGNOSIS — R809 Proteinuria, unspecified: Secondary | ICD-10-CM | POA: Diagnosis present

## 2013-11-12 DIAGNOSIS — Z5948 Other specified lack of adequate food: Secondary | ICD-10-CM | POA: Diagnosis present

## 2013-11-12 DIAGNOSIS — F1210 Cannabis abuse, uncomplicated: Secondary | ICD-10-CM | POA: Diagnosis present

## 2013-11-12 DIAGNOSIS — K3184 Gastroparesis: Secondary | ICD-10-CM | POA: Diagnosis present

## 2013-11-12 DIAGNOSIS — E872 Acidosis, unspecified: Secondary | ICD-10-CM | POA: Diagnosis present

## 2013-11-12 DIAGNOSIS — E669 Obesity, unspecified: Secondary | ICD-10-CM | POA: Diagnosis present

## 2013-11-12 DIAGNOSIS — IMO0001 Reserved for inherently not codable concepts without codable children: Secondary | ICD-10-CM

## 2013-11-12 DIAGNOSIS — F3289 Other specified depressive episodes: Secondary | ICD-10-CM | POA: Diagnosis present

## 2013-11-12 DIAGNOSIS — K5900 Constipation, unspecified: Secondary | ICD-10-CM | POA: Diagnosis present

## 2013-11-12 DIAGNOSIS — E1169 Type 2 diabetes mellitus with other specified complication: Secondary | ICD-10-CM | POA: Diagnosis present

## 2013-11-12 DIAGNOSIS — E119 Type 2 diabetes mellitus without complications: Secondary | ICD-10-CM | POA: Diagnosis present

## 2013-11-12 DIAGNOSIS — D649 Anemia, unspecified: Secondary | ICD-10-CM | POA: Diagnosis present

## 2013-11-12 DIAGNOSIS — F17200 Nicotine dependence, unspecified, uncomplicated: Secondary | ICD-10-CM | POA: Diagnosis present

## 2013-11-12 DIAGNOSIS — R112 Nausea with vomiting, unspecified: Secondary | ICD-10-CM

## 2013-11-12 DIAGNOSIS — F329 Major depressive disorder, single episode, unspecified: Secondary | ICD-10-CM | POA: Diagnosis present

## 2013-11-12 DIAGNOSIS — X58XXXA Exposure to other specified factors, initial encounter: Secondary | ICD-10-CM | POA: Diagnosis present

## 2013-11-12 DIAGNOSIS — Z9119 Patient's noncompliance with other medical treatment and regimen: Secondary | ICD-10-CM

## 2013-11-12 DIAGNOSIS — R030 Elevated blood-pressure reading, without diagnosis of hypertension: Secondary | ICD-10-CM

## 2013-11-12 DIAGNOSIS — K219 Gastro-esophageal reflux disease without esophagitis: Secondary | ICD-10-CM | POA: Diagnosis present

## 2013-11-12 HISTORY — DX: Type 2 diabetes mellitus without complications: E11.9

## 2013-11-12 LAB — COMPREHENSIVE METABOLIC PANEL
ALT: 11 U/L (ref 0–35)
AST: 16 U/L (ref 0–37)
Albumin: 3.3 g/dL — ABNORMAL LOW (ref 3.5–5.2)
Alkaline Phosphatase: 70 U/L (ref 39–117)
BUN: 12 mg/dL (ref 6–23)
CO2: 21 mEq/L (ref 19–32)
Calcium: 9 mg/dL (ref 8.4–10.5)
Chloride: 99 mEq/L (ref 96–112)
Creatinine, Ser: 0.72 mg/dL (ref 0.50–1.10)
GFR calc Af Amer: >90 mL/min (ref >90)
GFR calc non Af Amer: >90 mL/min (ref >90)
Glucose, Bld: 262 mg/dL — ABNORMAL HIGH (ref 70–99)
Potassium: 3.1 mEq/L — ABNORMAL LOW (ref 3.7–5.3)
Sodium: 138 mEq/L (ref 137–147)
Total Bilirubin: 0.3 mg/dL (ref 0.3–1.2)
Total Protein: 7.7 g/dL (ref 6.0–8.3)

## 2013-11-12 LAB — URINALYSIS, ROUTINE W REFLEX MICROSCOPIC
Bilirubin Urine: NEGATIVE
Glucose, UA: 250 mg/dL — AB
Ketones, ur: 15 mg/dL — AB
Leukocytes, UA: NEGATIVE
Nitrite: NEGATIVE
Protein, ur: >300 mg/dL — AB
Specific Gravity, Urine: 1.019 (ref 1.005–1.030)
Urobilinogen, UA: 0.2 mg/dL (ref 0.0–1.0)
pH: 8 (ref 5.0–8.0)

## 2013-11-12 LAB — POC URINE PREG, ED: Preg Test, Ur: NEGATIVE

## 2013-11-12 LAB — LIPASE, BLOOD: Lipase: 40 U/L (ref 11–59)

## 2013-11-12 LAB — CBC WITH DIFFERENTIAL/PLATELET
Basophils Absolute: 0 10*3/uL (ref 0.0–0.1)
Basophils Relative: 1 % (ref 0–1)
Eosinophils Absolute: 0.1 10*3/uL (ref 0.0–0.7)
Eosinophils Relative: 2 % (ref 0–5)
HCT: 35 % — ABNORMAL LOW (ref 36.0–46.0)
Hemoglobin: 12.7 g/dL (ref 12.0–15.0)
Lymphocytes Relative: 19 % (ref 12–46)
Lymphs Abs: 1.4 10*3/uL (ref 0.7–4.0)
MCH: 29.9 pg (ref 26.0–34.0)
MCHC: 36.3 g/dL — ABNORMAL HIGH (ref 30.0–36.0)
MCV: 82.4 fL (ref 78.0–100.0)
Monocytes Absolute: 0.3 10*3/uL (ref 0.1–1.0)
Monocytes Relative: 4 % (ref 3–12)
Neutro Abs: 5.7 10*3/uL (ref 1.7–7.7)
Neutrophils Relative %: 74 % (ref 43–77)
Platelets: 295 10*3/uL (ref 150–400)
RBC: 4.25 MIL/uL (ref 3.87–5.11)
RDW: 12.4 % (ref 11.5–15.5)
WBC: 7.7 10*3/uL (ref 4.0–10.5)

## 2013-11-12 LAB — I-STAT TROPONIN, ED: Troponin i, poc: 0.01 ng/mL (ref 0.00–0.08)

## 2013-11-12 LAB — URINE MICROSCOPIC-ADD ON

## 2013-11-12 MED ORDER — ONDANSETRON 4 MG PO TBDP
8.0000 mg | ORAL_TABLET | Freq: Once | ORAL | Status: AC
  Administered 2013-11-12: 8 mg via ORAL
  Filled 2013-11-12: qty 2

## 2013-11-12 MED ORDER — FENTANYL CITRATE 0.05 MG/ML IJ SOLN
50.0000 ug | Freq: Once | INTRAMUSCULAR | Status: AC
  Administered 2013-11-12: 50 ug via NASAL

## 2013-11-12 MED ORDER — FENTANYL CITRATE 0.05 MG/ML IJ SOLN
INTRAMUSCULAR | Status: AC
Start: 2013-11-12 — End: 2013-11-13
  Filled 2013-11-12: qty 2

## 2013-11-12 NOTE — ED Notes (Signed)
Nurse first rounds : nurse explained delay / wait time and process to pt. Respirations unlabored / no distress.

## 2013-11-12 NOTE — ED Notes (Addendum)
Pt reports mid abd pain and n/v since this am. Denies diarrhea or urinary symptoms. Pt very anxious, restless and vomiting at triage.

## 2013-11-12 NOTE — ED Notes (Signed)
Nurse Randel Books Sangalang notified of elevated BP

## 2013-11-13 ENCOUNTER — Emergency Department (HOSPITAL_COMMUNITY): Payer: BC Managed Care – PPO

## 2013-11-13 ENCOUNTER — Encounter (HOSPITAL_COMMUNITY): Payer: Self-pay | Admitting: General Practice

## 2013-11-13 DIAGNOSIS — E119 Type 2 diabetes mellitus without complications: Secondary | ICD-10-CM

## 2013-11-13 DIAGNOSIS — R112 Nausea with vomiting, unspecified: Secondary | ICD-10-CM | POA: Diagnosis present

## 2013-11-13 DIAGNOSIS — R030 Elevated blood-pressure reading, without diagnosis of hypertension: Secondary | ICD-10-CM

## 2013-11-13 DIAGNOSIS — F17200 Nicotine dependence, unspecified, uncomplicated: Secondary | ICD-10-CM

## 2013-11-13 LAB — GLUCOSE, CAPILLARY
Glucose-Capillary: 278 mg/dL — ABNORMAL HIGH (ref 70–99)
Glucose-Capillary: 139 mg/dL — ABNORMAL HIGH (ref 70–99)
Glucose-Capillary: 226 mg/dL — ABNORMAL HIGH (ref 70–99)

## 2013-11-13 LAB — BASIC METABOLIC PANEL
BUN: 12 mg/dL (ref 6–23)
CO2: 19 mEq/L (ref 19–32)
Calcium: 8.2 mg/dL — ABNORMAL LOW (ref 8.4–10.5)
Chloride: 104 mEq/L (ref 96–112)
Creatinine, Ser: 0.68 mg/dL (ref 0.50–1.10)
GFR calc Af Amer: >90 mL/min (ref >90)
GFR calc non Af Amer: >90 mL/min (ref >90)
Glucose, Bld: 209 mg/dL — ABNORMAL HIGH (ref 70–99)
Potassium: 3.9 mEq/L (ref 3.7–5.3)
Sodium: 140 mEq/L (ref 137–147)

## 2013-11-13 LAB — RAPID URINE DRUG SCREEN, HOSP PERFORMED
Amphetamines: NOT DETECTED
Barbiturates: NOT DETECTED
Benzodiazepines: NOT DETECTED
Cocaine: NOT DETECTED
Opiates: NOT DETECTED
Tetrahydrocannabinol: POSITIVE — AB

## 2013-11-13 LAB — HEMOGLOBIN A1C
Hgb A1c MFr Bld: 10 % — ABNORMAL HIGH (ref <5.7)
Mean Plasma Glucose: 240 mg/dL — ABNORMAL HIGH (ref <117)

## 2013-11-13 LAB — LACTIC ACID, PLASMA: Lactic Acid, Venous: 1.5 mmol/L (ref 0.5–2.2)

## 2013-11-13 MED ORDER — MORPHINE SULFATE 4 MG/ML IJ SOLN
4.0000 mg | Freq: Once | INTRAMUSCULAR | Status: AC
Start: 2013-11-13 — End: 2013-11-13
  Administered 2013-11-13: 4 mg via INTRAVENOUS
  Filled 2013-11-13: qty 1

## 2013-11-13 MED ORDER — METOCLOPRAMIDE HCL 5 MG/ML IJ SOLN
10.0000 mg | Freq: Once | INTRAMUSCULAR | Status: AC
  Administered 2013-11-13: 10 mg via INTRAVENOUS
  Filled 2013-11-13: qty 2

## 2013-11-13 MED ORDER — ACETAMINOPHEN 650 MG RE SUPP: 650.0000 mg | Freq: Four times a day (QID) | RECTAL | Status: DC | PRN

## 2013-11-13 MED ORDER — PNEUMOCOCCAL VAC POLYVALENT 25 MCG/0.5ML IJ INJ
0.5000 mL | INJECTION | INTRAMUSCULAR | Status: AC
  Administered 2013-11-16: 0.5 mL via INTRAMUSCULAR
  Filled 2013-11-13: qty 0.5

## 2013-11-13 MED ORDER — PROMETHAZINE HCL 25 MG/ML IJ SOLN
12.5000 mg | Freq: Once | INTRAMUSCULAR | Status: AC
  Administered 2013-11-13: 12.5 mg via INTRAVENOUS
  Filled 2013-11-13: qty 1

## 2013-11-13 MED ORDER — METOCLOPRAMIDE HCL 5 MG PO TABS
5.0000 mg | ORAL_TABLET | Freq: Three times a day (TID) | ORAL | Status: DC | PRN
  Administered 2013-11-14: 5 mg via ORAL
  Filled 2013-11-13: qty 1

## 2013-11-13 MED ORDER — SODIUM CHLORIDE 0.9 % IV SOLN
Freq: Once | INTRAVENOUS | Status: AC
  Administered 2013-11-13: 06:00:00 via INTRAVENOUS

## 2013-11-13 MED ORDER — SENNA 8.6 MG PO TABS
1.0000 | ORAL_TABLET | Freq: Two times a day (BID) | ORAL | Status: DC
  Administered 2013-11-13 – 2013-11-16 (×5): 8.6 mg via ORAL
  Filled 2013-11-13 (×8): qty 1

## 2013-11-13 MED ORDER — ONDANSETRON 4 MG PO TBDP
4.0000 mg | ORAL_TABLET | Freq: Once | ORAL | Status: AC
  Administered 2013-11-13: 4 mg via ORAL
  Filled 2013-11-13: qty 1

## 2013-11-13 MED ORDER — ONDANSETRON 4 MG PO TBDP: 4.0000 mg | ORAL_TABLET | Freq: Three times a day (TID) | ORAL | Status: DC | PRN

## 2013-11-13 MED ORDER — MORPHINE SULFATE 2 MG/ML IJ SOLN
1.0000 mg | INTRAMUSCULAR | Status: DC | PRN
  Administered 2013-11-13 – 2013-11-15 (×5): 2 mg via INTRAVENOUS
  Filled 2013-11-13 (×5): qty 1

## 2013-11-13 MED ORDER — POTASSIUM CHLORIDE IN NACL 40-0.9 MEQ/L-% IV SOLN
INTRAVENOUS | Status: DC
  Administered 2013-11-13 – 2013-11-14 (×3): via INTRAVENOUS
  Administered 2013-11-14: 150 mL/h via INTRAVENOUS
  Administered 2013-11-14: 09:00:00 via INTRAVENOUS
  Administered 2013-11-15 – 2013-11-16 (×2): 150 mL/h via INTRAVENOUS
  Filled 2013-11-13 (×16): qty 1000

## 2013-11-13 MED ORDER — DICYCLOMINE HCL 10 MG PO CAPS
10.0000 mg | ORAL_CAPSULE | Freq: Once | ORAL | Status: AC
  Administered 2013-11-13: 10 mg via ORAL
  Filled 2013-11-13: qty 1

## 2013-11-13 MED ORDER — METOPROLOL TARTRATE 1 MG/ML IV SOLN
5.0000 mg | INTRAVENOUS | Status: AC
  Administered 2013-11-13: 5 mg via INTRAVENOUS
  Filled 2013-11-13: qty 5

## 2013-11-13 MED ORDER — SODIUM CHLORIDE 0.9 % IV BOLUS (SEPSIS)
1000.0000 mL | Freq: Once | INTRAVENOUS | Status: AC
Start: 2013-11-13 — End: 2013-11-13
  Administered 2013-11-13: 1000 mL via INTRAVENOUS

## 2013-11-13 MED ORDER — METOCLOPRAMIDE HCL 5 MG/ML IJ SOLN
10.0000 mg | INTRAMUSCULAR | Status: AC
  Administered 2013-11-13: 10 mg via INTRAVENOUS
  Filled 2013-11-13: qty 2

## 2013-11-13 MED ORDER — ONDANSETRON HCL 4 MG/2ML IJ SOLN
4.0000 mg | Freq: Four times a day (QID) | INTRAMUSCULAR | Status: DC | PRN
  Administered 2013-11-13 – 2013-11-14 (×3): 4 mg via INTRAVENOUS
  Filled 2013-11-13 (×4): qty 2

## 2013-11-13 MED ORDER — ENOXAPARIN SODIUM 40 MG/0.4ML ~~LOC~~ SOLN
40.0000 mg | SUBCUTANEOUS | Status: DC
  Administered 2013-11-13 – 2013-11-16 (×3): 40 mg via SUBCUTANEOUS
  Filled 2013-11-13 (×4): qty 0.4

## 2013-11-13 MED ORDER — SODIUM CHLORIDE 0.9 % IV BOLUS (SEPSIS)
1000.0000 mL | Freq: Once | INTRAVENOUS | Status: AC
  Administered 2013-11-13: 1000 mL via INTRAVENOUS

## 2013-11-13 MED ORDER — ONDANSETRON HCL 4 MG PO TABS
4.0000 mg | ORAL_TABLET | Freq: Four times a day (QID) | ORAL | Status: DC | PRN
Start: 2013-11-13 — End: 2013-11-14

## 2013-11-13 MED ORDER — ACETAMINOPHEN 325 MG PO TABS
650.0000 mg | ORAL_TABLET | Freq: Four times a day (QID) | ORAL | Status: DC | PRN
  Administered 2013-11-13: 650 mg via ORAL
  Filled 2013-11-13: qty 2

## 2013-11-13 MED ORDER — ONDANSETRON HCL 4 MG/2ML IJ SOLN: 4.0000 mg | Freq: Three times a day (TID) | INTRAMUSCULAR | Status: DC | PRN

## 2013-11-13 MED ORDER — INSULIN ASPART 100 UNIT/ML ~~LOC~~ SOLN
0.0000 [IU] | Freq: Three times a day (TID) | SUBCUTANEOUS | Status: DC
  Administered 2013-11-13: 9 [IU] via SUBCUTANEOUS
  Administered 2013-11-14: 2 [IU] via SUBCUTANEOUS
  Administered 2013-11-14: 3 [IU] via SUBCUTANEOUS
  Administered 2013-11-16: 2 [IU] via SUBCUTANEOUS
  Administered 2013-11-16: 3 [IU] via SUBCUTANEOUS

## 2013-11-13 MED ORDER — ONDANSETRON HCL 4 MG/2ML IJ SOLN
4.0000 mg | Freq: Once | INTRAMUSCULAR | Status: AC
  Administered 2013-11-13: 4 mg via INTRAVENOUS
  Filled 2013-11-13: qty 2

## 2013-11-13 MED ORDER — KETOROLAC TROMETHAMINE 30 MG/ML IJ SOLN
30.0000 mg | Freq: Once | INTRAMUSCULAR | Status: AC
  Administered 2013-11-13: 30 mg via INTRAVENOUS
  Filled 2013-11-13: qty 1

## 2013-11-13 NOTE — ED Provider Notes (Signed)
CSN: [id number]     Arrival date & time 11/12/13  1849 History   First MD Initiated Contact with Patient 11/13/13 0017     Chief Complaint  Patient presents with  . Abdominal Pain  . Emesis     (Consider location/radiation/quality/duration/timing/severity/associated sxs/prior Treatment) HPI Comments: Patient with DM II Hx poorly controlled Followed by [facility] now with 1 days of severe abdominal pain nausea and vomiting  Also reports constipation. Took Pepto Bismol for pain/nausea without relief   Patient is a 38 y.o. female presenting with abdominal pain and vomiting. The history is provided by the patient.  Abdominal Pain Pain location:  Epigastric Pain quality: aching and cramping   Pain radiates to:  Does not radiate Pain severity:  Moderate Onset quality:  Gradual Duration:  1 day Timing:  Constant Progression:  Worsening Chronicity:  Recurrent Context: retching   Context: not alcohol use, not diet changes, not eating, not laxative use and not medication withdrawal   Relieved by:  None tried Worsened by:  Nothing tried Associated symptoms: constipation, nausea and vomiting   Associated symptoms: no chest pain, no cough, no diarrhea, no dysuria, no fever and no flatus   Risk factors: no alcohol abuse and has not had multiple surgeries   Risk factors comment:  DM II Emesis Associated symptoms: abdominal pain   Associated symptoms: no diarrhea and no myalgias     Past Medical History  Diagnosis Date  . Diabetes mellitus without complication   . Tobacco abuse   . Depression    History reviewed. No pertinent past surgical history. History reviewed. No pertinent family history. History  Substance Use Topics  . Smoking status: Former Smoker    Types: Cigarettes    Quit date: 08/10/2012  . Smokeless tobacco: Not on file  . Alcohol Use: Yes     Comment: "occasionally"   OB History   Grav Para Term Preterm Abortions TAB SAB Ect Mult Living                  Review of Systems  Constitutional: Negative for fever.  Respiratory: Negative for cough.   Cardiovascular: Negative for chest pain and leg swelling.  Gastrointestinal: Positive for nausea, vomiting, abdominal pain and constipation. Negative for diarrhea and flatus.  Endocrine: Positive for polydipsia and polyuria.  Genitourinary: Negative for dysuria.  Musculoskeletal: Negative for myalgias.  Skin: Negative for rash.  All other systems reviewed and are negative.      Allergies  Review of patient's allergies indicates no known allergies.  Home Medications   Current Outpatient Rx  Name  Route  Sig  Dispense  Refill  . metFORMIN (GLUCOPHAGE) 500 MG tablet   Oral   Take 2 tablets (1,000 mg total) by mouth 2 (two) times daily with a meal.   120 tablet   5   . Aspirin-Salicylamide-Caffeine (BC HEADACHE) 325-95-16 MG TABS   Oral   Take 1 Package by mouth daily as needed (for pain).          BP 181/103  Pulse 95  Temp(Src) 97.6 F (36.4 C) (Oral)  Resp 16  SpO2 100%  LMP 10/27/2013 Physical Exam  Nursing note and vitals reviewed. Constitutional: She appears well-developed and well-nourished. She appears distressed.  HENT:  Head: Normocephalic.  Mouth/Throat: Oropharynx is clear and moist.  Eyes: Pupils are equal, round, and reactive to light.  Neck: Normal range of motion.  Cardiovascular: Normal rate and regular rhythm.   Pulmonary/Chest: Effort normal  and breath sounds normal.  Abdominal: Soft. She exhibits no distension. There is tenderness in the epigastric area.  Musculoskeletal: Normal range of motion. She exhibits no edema and no tenderness.  Neurological: She is alert.  Skin: Skin is warm. No rash noted. No pallor.    ED Course  Procedures (including critical care time) Labs Review Labs Reviewed  CBC WITH DIFFERENTIAL - Abnormal; Notable for the following:    HCT 35.0 (*)    MCHC 36.3 (*)    All other components within normal limits   COMPREHENSIVE METABOLIC PANEL - Abnormal; Notable for the following:    Potassium 3.1 (*)    Glucose, Bld 262 (*)    Albumin 3.3 (*)    All other components within normal limits  URINALYSIS, ROUTINE W REFLEX MICROSCOPIC - Abnormal; Notable for the following:    Glucose, UA 250 (*)    Hgb urine dipstick MODERATE (*)    Ketones, ur 15 (*)    Protein, ur >300 (*)    All other components within normal limits  URINE MICROSCOPIC-ADD ON - Abnormal; Notable for the following:    Squamous Epithelial / LPF FEW (*)    All other components within normal limits  LIPASE, BLOOD  I-STAT TROPOININ, ED  POC URINE PREG, ED   Imaging Review No results found.   EKG Interpretation None      MDM  Patient has had fluid challenge, and failed.  She will be given, additional IV fluids, antiemetic.  She's also complaining of abdominal cramping.  Have asked that she be given, you a rectal and IV, Toradol, to be reassessed after the second liter of fluid.  Has been Final diagnoses:  None         Garald Balding, NP 11/13/13 0559

## 2013-11-13 NOTE — ED Notes (Signed)
Pt states that she has been vomiting every day for the past 3 weeks, states that she normally takes an alkaselter and that helps. States that she has lost 10 lbs from the vomiting. States that the alkaselter did not help this time.

## 2013-11-13 NOTE — Progress Notes (Signed)
VS:  .BP 171/93  Pulse 105  Temp(Src) 100.1 F (37.8 C) (Oral)  Resp 20  SpO2 100%  LMP 10/27/2013  MD paged about pt's last set of vs, no new orders given. Will  monitor pt.

## 2013-11-13 NOTE — ED Notes (Signed)
Pt given sprite per patient request.

## 2013-11-13 NOTE — ED Notes (Signed)
Pt went to the restroom.

## 2013-11-13 NOTE — ED Provider Notes (Signed)
Medical screening examination/treatment/procedure(s) were performed by non-physician practitioner and as supervising physician I was immediately available for consultation/collaboration.   EKG Interpretation None        Mariea Clonts, MD 11/13/13 [phone]

## 2013-11-13 NOTE — ED Provider Notes (Signed)
Medical screening examination/treatment/procedure(s) were performed by non-physician practitioner and as supervising physician I was immediately available for consultation/collaboration.   EKG Interpretation None        Julianne Rice, MD 11/13/13 [phone]

## 2013-11-13 NOTE — Progress Notes (Signed)
Patient has vomited 4 times in the last 4 hours. Yellow and coffee ground in color. Order for Phenergan given with PRN morphine. Will continue to assess per shift.

## 2013-11-13 NOTE — ED Notes (Signed)
Pt refused suppository and requested PO meds.

## 2013-11-13 NOTE — ED Notes (Addendum)
Vomit noted at the bedside in a green bag. Pt reports vomiting continuing. Pt complaining of abdominal pain coming back. MD Notified.

## 2013-11-13 NOTE — ED Notes (Signed)
Pt attempting to drink ginger ale again

## 2013-11-13 NOTE — ED Notes (Signed)
Pt taken to XRay 

## 2013-11-13 NOTE — ED Notes (Signed)
Pt vomited x1 after drinking ginger ale

## 2013-11-13 NOTE — H&P (Signed)
Internal Medicine Attending Admission Note Date: 11/13/2013  Patient name: Kathy Frank Medical record number: [id number] Date of birth: 01/25/1976 Age: 38 y.o. Gender: female  I saw and evaluated the patient. I reviewed the resident's note and I agree with the resident's findings and plan as documented in the resident's note, with the following additional comments.  Chief Complaint(s): Nausea and vomiting  History - key components related to admission: Patient is a 38 year old woman with poorly controlled diabetes mellitus and other problems as outlined in the medical history, admitted with recurrent nausea and vomiting which began one day prior to admission.  She reports no abdominal pain prior to nausea and vomiting, with some soreness after repeated vomiting.  She denies any hematemesis.  She denies fever, chills, chest pain, shortness of breath, dysuria, or recent sick contacts.   Physical Exam - key components related to admission:  Filed Vitals:   11/13/13 0952 11/13/13 1042 11/13/13 1449 11/13/13 1722  BP: 168/100 173/105 171/93 190/121  Pulse: 106 106 105 107  Temp: 98.2 F (36.8 C) 99.5 F (37.5 C) 100.1 F (37.8 C) 98.9 F (37.2 C)  TempSrc: Oral Oral Oral Oral  Resp: 16 20 20 20   SpO2: 99%  100% 100%   General: Alert, no distress Lungs: Clear Heart: Regular; no extra sounds or murmurs Abdomen: Bowel sounds present, soft, nontender; no hepatosplenomegaly Extremities: No edema   Lab results:   Basic Metabolic Panel:  Recent Labs  11/12/13 1942  NA 138  K 3.1*  CL 99  CO2 21  GLUCOSE 262*  BUN 12  CREATININE 0.72  CALCIUM 9.0    Liver Function Tests:  Recent Labs  11/12/13 1942  AST 16  ALT 11  ALKPHOS 70  BILITOT 0.3  PROT 7.7  ALBUMIN 3.3*    Recent Labs  11/12/13 1942  LIPASE 40     CBC:  Recent Labs  11/12/13 1942  WBC 7.7  HGB 12.7  HCT 35.0*  MCV 82.4  PLT 295    Recent Labs  11/12/13 1942  NEUTROABS 5.7   LYMPHSABS 1.4  MONOABS 0.3  EOSABS 0.1  BASOSABS 0.0    CBG:  Recent Labs  11/13/13 1627  GLUCAP 139*     Hemoglobin A1C  Date Value Ref Range Status  12/05/2012 11.4   Final      Urine Drug Screen: Drugs of Abuse     Component Value Date/Time   LABOPIA NONE DETECTED 11/13/2013 0957   COCAINSCRNUR NONE DETECTED 11/13/2013 0957   LABBENZ NONE DETECTED 11/13/2013 0957   AMPHETMU NONE DETECTED 11/13/2013 0957   THCU POSITIVE* 11/13/2013 0957   LABBARB NONE DETECTED 11/13/2013 0957      Urinalysis    Component Value Date/Time   COLORURINE YELLOW 11/12/2013 1935   APPEARANCEUR CLEAR 11/12/2013 1935   LABSPEC 1.019 11/12/2013 1935   PHURINE 8.0 11/12/2013 1935   GLUCOSEU 250* 11/12/2013 1935   HGBUR MODERATE* 11/12/2013 [place] NEGATIVE 11/12/2013 1935   KETONESUR 15* 11/12/2013 1935   PROTEINUR >300* 11/12/2013 1935   UROBILINOGEN 0.2 11/12/2013 1935   NITRITE NEGATIVE 11/12/2013 1935   LEUKOCYTESUR NEGATIVE 11/12/2013 1935    Urine microscopic:  Recent Labs  11/12/13 1935  EPIU FEW*  WBCU 0-2  RBCU 3-6  BACTERIA RARE     Imaging results:  Dg Abd Acute W/chest  11/13/2013   CLINICAL DATA:  Abdominal pain and emesis  EXAM: ACUTE ABDOMEN SERIES (ABDOMEN 2 VIEW & CHEST 1 VIEW)  COMPARISON:  Chest radiograph February 05, 2009  FINDINGS: PA chest: Lungs are clear. Heart size and pulmonary vascularity are normal. No adenopathy.  Supine and upright abdomen: Bowel gas pattern is unremarkable. No obstruction or free air. There is moderate stool in the colon. There are phleboliths in the pelvis.  IMPRESSION: Bowel gas pattern unremarkable.  Lungs clear.   Electronically Signed   By: Lowella Grip M.D.   On: 11/13/2013 08:47     Assessment & Plan by Problem:  1.  Recurrent nausea/vomiting, etiology unclear.  Patient reports feeling much better having received IV fluid in the ED; she currently has no nausea.  Her symptoms may represent gastroenteritis, diabetic gastroparesis, or other  process.  Plans include IV fluids; symptomatic treatment as needed with anti-emetic medications; clear liquid diet; consider upper GI workup if symptoms recur.  2.  Poorly controlled diabetes mellitus.  Plan is check hemoglobin A1c; hold metformin; use sliding scale insulin in acute setting; follow CBGs and adjust regimen as indicated.  3.  Hypertension.  Patient is not on antihypertensive medication at home.  Plan is treat severe elevations in the acute setting, and consider oral regimen when patient is taking PO well.  4.  Other problems and plans as per the resident physician's note.

## 2013-11-13 NOTE — H&P (Signed)
Date: 11/13/2013               Patient Name:  Kathy Frank MRN: [id number]  DOB: 02-12-1976 Age / Sex: 38 y.o., female   PCP: Rebecca Eaton, MD         Medical Service: Internal Medicine Teaching Service         Attending Physician: Dr. Axel Filler, MD    First Contact: Dr. Lucila Maine Pager: [phone]  Second Contact: Dr. Eulas Post Pager: [phone]       After Hours (After 5p/  First Contact Pager: [phone]  weekends / holidays): Second Contact Pager: [phone]   Chief Complaint: nausea/vomiting  History of Present Illness:  The patient is a 38 YO female with poorly controlled diabetes. She states that she has been having some nausea upon awaking for about 1 month and yesterday she woke up and started vomiting and was unable to stop. She came to the ED for care and was still unable to tolerate anything by mouth. She denies eating anywhere new or at any food trucks. She denies any diarrhea and states that she may be slightly constipated. She denies any chest pain or SOB. She denies fevers or chills. She states that no one around her is sick. She is not having any leg swelling. She started having some abdominal pain after the multiple episodes of vomiting. She is only taking metformin for her diabetes and does not admit to having any other medical problems.   Meds: Current Facility-Administered Medications  Medication Dose Route Frequency Provider Last Rate Last Dose  . metoCLOPramide (REGLAN) injection 10 mg  10 mg Intravenous STAT Larene Pickett, PA-C      . ondansetron [facility]) injection 4 mg  4 mg Intravenous Q8H PRN Larene Pickett, PA-C       Current Outpatient Prescriptions  Medication Sig Dispense Refill  . metFORMIN (GLUCOPHAGE) 500 MG tablet Take 2 tablets (1,000 mg total) by mouth 2 (two) times daily with a meal.  120 tablet  5  . Aspirin-Salicylamide-Caffeine (BC HEADACHE) 325-95-16 MG TABS Take 1 Package by mouth daily as needed (for pain).        Allergies: Allergies as of  11/12/2013  . (No Known Allergies)   Past Medical History  Diagnosis Date  . Diabetes mellitus without complication   . Tobacco abuse   . Depression    History reviewed. No pertinent past surgical history. History reviewed. No pertinent family history. History   Social History  . Marital Status: Single    Spouse Name: N/A    Number of Children: N/A  . Years of Education: N/A   Occupational History  . Not on file.   Social History Main Topics  . Smoking status: Former Smoker    Types: Cigarettes    Quit date: 08/10/2012  . Smokeless tobacco: Not on file  . Alcohol Use: Yes     Comment: "occasionally"  . Drug Use: No  . Sexual Activity: Not on file   Other Topics Concern  . Not on file   Social History Narrative  . No narrative on file    Review of Systems: A comprehensive 10 point review of systems was performed and other than pertinent positives and negatives listed above in HPI was negative.   Physical Exam: Blood pressure 172/95, pulse 103, temperature 98.3 F (36.8 C), temperature source Oral, resp. rate 18, last menstrual period 10/27/2013, SpO2 99.00%. General: resting in bed, in mild distress HEENT: PERRL, EOMI, no  scleral icterus Cardiac: S1 S2 normal, slightly fast rate Pulm: clear to auscultation bilaterally, moving normal volumes of air Abd: soft, diffusely mildly tender, nondistended, BS present Ext: warm and well perfused, no pedal edema Neuro: alert and oriented X3, cranial nerves II-XII grossly intact  Lab results: Basic Metabolic Panel:  Recent Labs  11/12/13 1942  NA 138  K 3.1*  CL 99  CO2 21  GLUCOSE 262*  BUN 12  CREATININE 0.72  CALCIUM 9.0   Liver Function Tests:  Recent Labs  11/12/13 1942  AST 16  ALT 11  ALKPHOS 70  BILITOT 0.3  PROT 7.7  ALBUMIN 3.3*    Recent Labs  11/12/13 1942  LIPASE 40   CBC:  Recent Labs  11/12/13 1942  WBC 7.7  NEUTROABS 5.7  HGB 12.7  HCT 35.0*  MCV 82.4  PLT 295     Recent Labs  11/12/13 1935  COLORURINE YELLOW  LABSPEC 1.019  PHURINE 8.0  GLUCOSEU 250*  HGBUR MODERATE*  BILIRUBINUR NEGATIVE  KETONESUR 15*  PROTEINUR >300*  UROBILINOGEN 0.2  NITRITE NEGATIVE  LEUKOCYTESUR NEGATIVE   Misc. Labs: UDS ordered  Imaging results:  Dg Abd Acute W/chest  11/13/2013   CLINICAL DATA:  Abdominal pain and emesis  EXAM: ACUTE ABDOMEN SERIES (ABDOMEN 2 VIEW & CHEST 1 VIEW)  COMPARISON:  Chest radiograph February 05, 2009  FINDINGS: PA chest: Lungs are clear. Heart size and pulmonary vascularity are normal. No adenopathy.  Supine and upright abdomen: Bowel gas pattern is unremarkable. No obstruction or free air. There is moderate stool in the colon. There are phleboliths in the pelvis.  IMPRESSION: Bowel gas pattern unremarkable.  Lungs clear.   Electronically Signed   By: Lowella Grip M.D.   On: 11/13/2013 08:47   Assessment & Plan by Problem:  Nausea & vomiting - Patient would like to try clear liquids. Unclear etiology although diabetic gastroparesis would be on the differential, gastritis is on the differential although no one else is sick. She does not have a bowel obstruction and she does not have pancreatitis (lipase negative in ED). She is not having chest pain and troponin negative in the ED (given diabetes could be atypical MI). TIMI 0. Her urine pregnancy is negative and checking UDS for surreptitious marijuana usage (which can cause N/V).  -Place in med surg observation -Zofran and reglan for nausea -Clear liquid diet -NS w 40 mEq Kcl @ 150 cc/hr  -If N/V improved can have gastric empty study as out-patient  AG of 18 - She does have some ketones in her urine but feel that this is starvation ketosis from lack of food rather than DKA. She has no alcohol usage and denies any illicit drugs. No indication of acute abdomen or mesenteric ischemia causing lactic acidosis but will check. -Lactic acid now -Repeat BMP in AM -Fluids as above -Check  UDS  DIABETES MELLITUS, TYPE II - Uncontrolled and question if she is type 1 or 2 as she is not morbidly overweight. She is currently taking metformin 100 mg BID at home. She has proteinuria and would benefit from better control and ACE-I therapy.  -Check HgA1c -Hold metformin -SSI sensitive  TOBACCO USER - Advised her to quit however she did not wish to at this time stating that she only smokes every once in a while.   Elevated blood pressure - Currently elevated and likely secondary to pain and vomiting. Will monitor while in-patient.   DVT ppx - Lovenox Gratis daily  Dispo: Disposition is deferred at this time, awaiting improvement of current medical problems. Anticipated discharge in approximately 1-2 day(s).   The patient does have a current PCP Rebecca Eaton, MD) and does need an [facility] hospital follow-up appointment after discharge.  The patient does not have transportation limitations that hinder transportation to clinic appointments.  Signed: Olga Millers, MD 11/13/2013, 9:29 AM

## 2013-11-13 NOTE — ED Notes (Signed)
PA at the bedside. Pt unable to pass PO challenge.

## 2013-11-13 NOTE — ED Notes (Signed)
Admitting physician at the bedside.

## 2013-11-13 NOTE — ED Provider Notes (Signed)
Pt received in sign out from NP Olean Ree at shift change.  38 y.o. F with hx of DM and depression presenting with abdominal cramping and persistent nausea/vomiting.  No prior abdominal surgeries.  Anion gap of 18, normal bicarb.  U/a with small ketones.  Pt not clinically in DKA  Plan:  Second liter fluid bolus infusing.  Pt given additional anti-emetics.  Will re-check at 0800-- if vomiting continues will admit for further management.  Pt reassess, comfortably sleeping in bed.  She has finished her second liter fluid bolus.  She tolerated PO meds and fluids without recurrent vomiting.  She remains afebrile and non-toxic appearing.  She will be discharged home with zofran.  8:14 AM Attempted to discharge pt, another episode of vomiting which is clear with minimal mucous present.  Will obtain acute abd series to r/o obstruction given pts recent constipation issues.  If negative, will admit to medicine for overnight observation and continued sx control.  Results for orders placed during the hospital encounter of 11/12/13  CBC WITH DIFFERENTIAL      Result Value Ref Range   WBC 7.7  4.0 - 10.5 K/uL   RBC 4.25  3.87 - 5.11 MIL/uL   Hemoglobin 12.7  12.0 - 15.0 g/dL   HCT 35.0 (*) 36.0 - 46.0 %   MCV 82.4  78.0 - 100.0 fL   MCH 29.9  26.0 - 34.0 pg   MCHC 36.3 (*) 30.0 - 36.0 g/dL   RDW 12.4  11.5 - 15.5 %   Platelets 295  150 - 400 K/uL   Neutrophils Relative % 74  43 - 77 %   Neutro Abs 5.7  1.7 - 7.7 K/uL   Lymphocytes Relative 19  12 - 46 %   Lymphs Abs 1.4  0.7 - 4.0 K/uL   Monocytes Relative 4  3 - 12 %   Monocytes Absolute 0.3  0.1 - 1.0 K/uL   Eosinophils Relative 2  0 - 5 %   Eosinophils Absolute 0.1  0.0 - 0.7 K/uL   Basophils Relative 1  0 - 1 %   Basophils Absolute 0.0  0.0 - 0.1 K/uL  COMPREHENSIVE METABOLIC PANEL      Result Value Ref Range   Sodium 138  137 - 147 mEq/L   Potassium 3.1 (*) 3.7 - 5.3 mEq/L   Chloride 99  96 - 112 mEq/L   CO2 21  19 - 32 mEq/L   Glucose, Bld  262 (*) 70 - 99 mg/dL   BUN 12  6 - 23 mg/dL   Creatinine, Ser 0.72  0.50 - 1.10 mg/dL   Calcium 9.0  8.4 - 10.5 mg/dL   Total Protein 7.7  6.0 - 8.3 g/dL   Albumin 3.3 (*) 3.5 - 5.2 g/dL   AST 16  0 - 37 U/L   ALT 11  0 - 35 U/L   Alkaline Phosphatase 70  39 - 117 U/L   Total Bilirubin 0.3  0.3 - 1.2 mg/dL   GFR calc non Af Amer >90  >90 mL/min   GFR calc Af Amer >90  >90 mL/min  LIPASE, BLOOD      Result Value Ref Range   Lipase 40  11 - 59 U/L  URINALYSIS, ROUTINE W REFLEX MICROSCOPIC      Result Value Ref Range   Color, Urine YELLOW  YELLOW   APPearance CLEAR  CLEAR   Specific Gravity, Urine 1.019  1.005 - 1.030   pH 8.0  5.0 - 8.0   Glucose, UA 250 (*) NEGATIVE mg/dL   Hgb urine dipstick MODERATE (*) NEGATIVE   Bilirubin Urine NEGATIVE  NEGATIVE   Ketones, ur 15 (*) NEGATIVE mg/dL   Protein, ur >300 (*) NEGATIVE mg/dL   Urobilinogen, UA 0.2  0.0 - 1.0 mg/dL   Nitrite NEGATIVE  NEGATIVE   Leukocytes, UA NEGATIVE  NEGATIVE  URINE MICROSCOPIC-ADD ON      Result Value Ref Range   Squamous Epithelial / LPF FEW (*) RARE   WBC, UA 0-2  <3 WBC/hpf   RBC / HPF 3-6  <3 RBC/hpf   Bacteria, UA RARE  RARE  I-STAT TROPOININ, ED      Result Value Ref Range   Troponin i, poc 0.01  0.00 - 0.08 ng/mL   Comment 3           POC URINE PREG, ED      Result Value Ref Range   Preg Test, Ur NEGATIVE  NEGATIVE   Dg Abd Acute W/chest  11/13/2013   CLINICAL DATA:  Abdominal pain and emesis  EXAM: ACUTE ABDOMEN SERIES (ABDOMEN 2 VIEW & CHEST 1 VIEW)  COMPARISON:  Chest radiograph February 05, 2009  FINDINGS: PA chest: Lungs are clear. Heart size and pulmonary vascularity are normal. No adenopathy.  Supine and upright abdomen: Bowel gas pattern is unremarkable. No obstruction or free air. There is moderate stool in the colon. There are phleboliths in the pelvis.  IMPRESSION: Bowel gas pattern unremarkable.  Lungs clear.   Electronically Signed   By: Lowella Grip M.D.   On: 11/13/2013 08:47     Discussed with Dr. Doug Sou of IM teaching service, pt will be admitted for observation and symptomatic control.  Temp admission orders placed.  VS remain stable.  Larene Pickett, PA-C 11/13/13 1000

## 2013-11-14 DIAGNOSIS — E1165 Type 2 diabetes mellitus with hyperglycemia: Secondary | ICD-10-CM

## 2013-11-14 DIAGNOSIS — IMO0001 Reserved for inherently not codable concepts without codable children: Secondary | ICD-10-CM

## 2013-11-14 LAB — BASIC METABOLIC PANEL
BUN: 11 mg/dL (ref 6–23)
CO2: 20 mEq/L (ref 19–32)
Calcium: 8.5 mg/dL (ref 8.4–10.5)
Chloride: 108 mEq/L (ref 96–112)
Creatinine, Ser: 0.74 mg/dL (ref 0.50–1.10)
GFR calc Af Amer: >90 mL/min (ref >90)
GFR calc non Af Amer: >90 mL/min (ref >90)
Glucose, Bld: 174 mg/dL — ABNORMAL HIGH (ref 70–99)
Potassium: 4 mEq/L (ref 3.7–5.3)
Sodium: 142 mEq/L (ref 137–147)

## 2013-11-14 LAB — GLUCOSE, CAPILLARY
Glucose-Capillary: 155 mg/dL — ABNORMAL HIGH (ref 70–99)
Glucose-Capillary: 188 mg/dL — ABNORMAL HIGH (ref 70–99)
Glucose-Capillary: 237 mg/dL — ABNORMAL HIGH (ref 70–99)
Glucose-Capillary: 105 mg/dL — ABNORMAL HIGH (ref 70–99)

## 2013-11-14 MED ORDER — METOPROLOL TARTRATE 1 MG/ML IV SOLN
5.0000 mg | Freq: Four times a day (QID) | INTRAVENOUS | Status: DC | PRN
  Administered 2013-11-14 – 2013-11-15 (×2): 5 mg via INTRAVENOUS
  Filled 2013-11-14 (×2): qty 5

## 2013-11-14 MED ORDER — LISINOPRIL 10 MG PO TABS
10.0000 mg | ORAL_TABLET | Freq: Every day | ORAL | Status: DC
  Administered 2013-11-14 – 2013-11-15 (×2): 10 mg via ORAL
  Filled 2013-11-14 (×4): qty 1

## 2013-11-14 MED ORDER — PANTOPRAZOLE SODIUM 40 MG IV SOLR
40.0000 mg | INTRAVENOUS | Status: DC
Start: 2013-11-14 — End: 2013-11-14

## 2013-11-14 MED ORDER — PROMETHAZINE HCL 25 MG RE SUPP: 12.5000 mg | Freq: Four times a day (QID) | RECTAL | Status: DC | PRN

## 2013-11-14 MED ORDER — PROMETHAZINE HCL 25 MG PO TABS
12.5000 mg | ORAL_TABLET | Freq: Four times a day (QID) | ORAL | Status: DC | PRN
  Administered 2013-11-15 – 2013-11-16 (×2): 12.5 mg via ORAL
  Filled 2013-11-14 (×2): qty 1

## 2013-11-14 MED ORDER — METOPROLOL TARTRATE 1 MG/ML IV SOLN
5.0000 mg | INTRAVENOUS | Status: AC
  Administered 2013-11-14: 5 mg via INTRAVENOUS
  Filled 2013-11-14: qty 5

## 2013-11-14 MED ORDER — FAMOTIDINE IN NACL 20-0.9 MG/50ML-% IV SOLN
20.0000 mg | Freq: Two times a day (BID) | INTRAVENOUS | Status: DC
  Administered 2013-11-14 – 2013-11-16 (×4): 20 mg via INTRAVENOUS
  Filled 2013-11-14 (×5): qty 50

## 2013-11-14 MED ORDER — PROMETHAZINE HCL 25 MG/ML IJ SOLN
12.5000 mg | Freq: Four times a day (QID) | INTRAMUSCULAR | Status: DC | PRN
  Administered 2013-11-14 – 2013-11-16 (×4): 12.5 mg via INTRAVENOUS
  Filled 2013-11-14 (×4): qty 1

## 2013-11-14 NOTE — Progress Notes (Signed)
Agree with dietetic intern assessment. Ensure Complete may not be appropriate for pt based on current elevated glucose. Will re-assess intervention once pt diet advanced and adequacy of PO intake monitored.  Pryor Ochoa RD, LDN Inpatient Clinical Dietitian Pager: [phone] After Hours Pager: [phone]

## 2013-11-14 NOTE — Progress Notes (Signed)
Bp 201/112 and HR 95.   PRN-Meteoprolol 5mg  given. Will recheck bp.

## 2013-11-14 NOTE — Progress Notes (Signed)
Subjective: Patient vomited 4 times last night with a yellow to coffee-ground color, phenergan helped (zofran doesn't). Two more episodes of vomiting this am, yellowish in consistency (no bright-red blood or coffee-ground color). She was tolerating clears yesterday but reports difficulty with keeping down liquids today.  Per nurse, she wasn't able to take her po reglan as well.   She also endorses some stable 5/10 epigastric pain which she associates with vomiting. No chest pain or shortness of breath.    Objective: Vital signs in last 24 hours: Filed Vitals:   11/13/13 1842 11/13/13 2057 11/14/13 0220 11/14/13 0600  BP: 169/85 186/97 153/92 172/100  Pulse: 103 85 107 90  Temp:  99.5 F (37.5 C) 98.6 F (37 C) 98.9 F (37.2 C)  TempSrc:  Oral Oral Oral  Resp:  20 18 18   SpO2:  100% 100% 100%   Weight change:   Intake/Output Summary (Last 24 hours) at 11/14/13 0956 Last data filed at 11/14/13 0700  Gross per 24 hour  Intake   3260 ml  Output      0 ml  Net   3260 ml    Physical Exam  General: resting in bed, appears comfortable  HEENT: PERRL, EOMI, no scleral icterus  Cardiovascular: RRR, normal S1S2, no murmur/rubs/gallops Pulmonary: Normal work of breathing. Clear to auscultation bilaterally, no wheezes, rales or rhonchi.  Abdomen: Normoactive bowel sounds. Mild epigastric tenderness, no distention, rigidity or rebound tenderness. Extremities: Warm and well perfused, no peripheral edema.  Neuro: Alert and oriented X3, cranial nerves II-XII grossly intact.   Lab Results: BMET    Component Value Date/Time   NA 142 11/14/2013 0730   K 4.0 11/14/2013 0730   CL 108 11/14/2013 0730   CO2 20 11/14/2013 0730   GLUCOSE 174* 11/14/2013 0730   BUN 11 11/14/2013 0730   CREATININE 0.74 11/14/2013 0730   CALCIUM 8.5 11/14/2013 0730   GFRNONAA >90 11/14/2013 0730   GFRAA >90 11/14/2013 0730   Hgb A1c - 10 Troponin - 0.01 Lipase - 40 Lactic Acid - 1.5 UDS - THC Urine Pregnancy -  Negative HIV - Pending  Studies/Results: Dg Abd Acute W/chest  11/13/2013   CLINICAL DATA:  Abdominal pain and emesis  EXAM: ACUTE ABDOMEN SERIES (ABDOMEN 2 VIEW & CHEST 1 VIEW)  COMPARISON:  Chest radiograph February 05, 2009  FINDINGS: PA chest: Lungs are clear. Heart size and pulmonary vascularity are normal. No adenopathy.  Supine and upright abdomen: Bowel gas pattern is unremarkable. No obstruction or free air. There is moderate stool in the colon. There are phleboliths in the pelvis.  IMPRESSION: Bowel gas pattern unremarkable.  Lungs clear.   Electronically Signed   By: Lowella Grip M.D.   On: 11/13/2013 08:47   Medications: I have reviewed the patient's current medications. Scheduled Meds: . enoxaparin (LOVENOX) injection  40 mg Subcutaneous Q24H  . insulin aspart  0-9 Units Subcutaneous TID WC  . pneumococcal 23 valent vaccine  0.5 mL Intramuscular Tomorrow-1000  . senna  1 tablet Oral BID   Continuous Infusions: . 0.9 % NaCl with KCl 40 mEq / L 150 mL/hr at 11/14/13 0830   PRN Meds:.acetaminophen, acetaminophen, metoCLOPramide, morphine injection, ondansetron (ZOFRAN) IV, ondansetron   Assessment/Plan:  **Nausea/Vomiting: Patient with intractable vomiting since 1 day prior to admission. Initially without abdominal pain but has developed mild to moderate abdominal pain and tenderness with the continuation of vomiting. In the setting of uncontrolled diabetes (Hgb A1c 10), diabetic gastroparesis is a likely  cause. Gastritis also on differential though no evidence of GI bleeding.  Less likely causes include gastroenteritis (no fevers, or recent sick contacts), bowel obstruction (normal AXR), pancreatitis (normal lipase), pregnancy (normal beta HCG test), ischemic colitis or mesenteric ischemia (normal lactate). - On reglan and zofran (though some trouble tolerating po). - Continuing clear diet as tolerated - Obtaining GI consult for possible Upper Endoscopy or Gastric Emptying Study.  Will hold reglan until consult recs and start on phenergan.   **DM2 (uncontrolled): Glucose of 262 on admission and Hgb A1c came back at 10. She's currently taking Metformin 100mg  BID at home (held here) but with the proteinuria and current elevations in BP she should be on an ACEI as well. - Started lisinopril 10mg  q6h today - She's currently on sliding scale insulin and will continue to monitor her glucose levels.  **Anion Gap of 18: She originally had an Anion Gap of 18 - thought to be due to starvation ketosis over DKA (especially given the glucose level of 262). Lactate level was normal and the gap has since closed.   **Elevated BP: BP has remained elevated since admission, likely due to pain and vomiting.  Has ranged from 153/92 to 190/121.  - 1 dose lopressor 5mg  (4/6) helped. - Lisinopril 10mg  q6h started today (4/7)  - Will monitor  Dispo: Waiting for symptomatic improvement and GI recommendations for discharge decision.    This is a Careers information officer Note.  The care of the patient was discussed with Dr. Marinda Elk and the assessment and plan formulated with their assistance.  Please see their attached note for official documentation of the daily encounter.   LOS: 2 days   Isac Sarna, Med Student 11/14/2013, 9:56 AM

## 2013-11-14 NOTE — Progress Notes (Signed)
Subjective:    I saw patient on a.m. rounds, and I discussed her care with house staff.  She had recurrent nausea and vomiting overnight, and has taken little clear liquids today due to persisting nausea with some vomiting this morning.  She also reports some epigastric pain.  She denies hematemesis.  Objective:    Vital Signs:   Temp:  [98.6 F (37 C)-99.5 F (37.5 C)] 98.7 F (37.1 C) (04/07 1447) Pulse Rate:  [85-107] 95 (04/07 1447) Resp:  [18-20] 18 (04/07 1447) BP: (153-201)/(85-121) 201/112 mmHg (04/07 1447) SpO2:  [100 %] 100 % (04/07 1447) Last BM Date: 11/12/13  24-hour weight change: Weight change:   Intake/Output:   Intake/Output Summary (Last 24 hours) at 11/14/13 1645 Last data filed at 11/14/13 0700  Gross per 24 hour  Intake   3020 ml  Output      0 ml  Net   3020 ml      Physical Exam:  General: Alert, oriented Lungs: Clear Heart: Regular; no extra sounds or murmurs Abdomen: Bowel sounds present, soft; moderate epigastric tenderness Extremities: No edema  Labs:  Basic Metabolic Panel:  Recent Labs Lab 11/12/13 1942 11/13/13 2110 11/14/13 0730  NA 138 140 142  K 3.1* 3.9 4.0  CL 99 104 108  CO2 21 19 20   GLUCOSE 262* 209* 174*  BUN 12 12 11   CREATININE 0.72 0.68 0.74  CALCIUM 9.0 8.2* 8.5    Liver Function Tests:  Recent Labs Lab 11/12/13 1942  AST 16  ALT 11  ALKPHOS 70  BILITOT 0.3  PROT 7.7  ALBUMIN 3.3*    Recent Labs Lab 11/12/13 1942  LIPASE 40    CBC:  Recent Labs Lab 11/12/13 1942  WBC 7.7  NEUTROABS 5.7  HGB 12.7  HCT 35.0*  MCV 82.4  PLT 295    CBG:  Recent Labs Lab 11/13/13 1223 11/13/13 1627 11/13/13 2229 11/14/13 0651 11/14/13 1121  GLUCAP 278* 139* 226* 155* 188*    Imaging: Dg Abd Acute W/chest  11/13/2013   CLINICAL DATA:  Abdominal pain and emesis  EXAM: ACUTE ABDOMEN SERIES (ABDOMEN 2 VIEW & CHEST 1 VIEW)  COMPARISON:  Chest radiograph February 05, 2009  FINDINGS: PA chest: Lungs are  clear. Heart size and pulmonary vascularity are normal. No adenopathy.  Supine and upright abdomen: Bowel gas pattern is unremarkable. No obstruction or free air. There is moderate stool in the colon. There are phleboliths in the pelvis.  IMPRESSION: Bowel gas pattern unremarkable.  Lungs clear.   Electronically Signed   By: Lowella Grip M.D.   On: 11/13/2013 08:47       Medications:    Infusions: . 0.9 % NaCl with KCl 40 mEq / L 150 mL/hr at 11/14/13 0830    Scheduled Medications: . enoxaparin (LOVENOX) injection  40 mg Subcutaneous Q24H  . insulin aspart  0-9 Units Subcutaneous TID WC  . lisinopril  10 mg Oral Daily  . pantoprazole (PROTONIX) IV  40 mg Intravenous Q24H  . pneumococcal 23 valent vaccine  0.5 mL Intramuscular Tomorrow-1000  . senna  1 tablet Oral BID    PRN Medications: acetaminophen, acetaminophen, metoprolol, morphine injection, promethazine, promethazine, promethazine   Assessment/ Plan:    1.  Recurrent nausea and vomiting.  Differential includes diabetic gastroparesis, gastritis, or other upper GI process.  GI consult was obtained, and Dr. Benson Norway recommended addition of an IV PPI and continuing anti-emetics.  2.  Diabetes mellitus Type 2.  Metformin was  held on admission.  Plan is continue sliding scale insulin; follow CBGs and adjust regimen as indicated.  3.  Hypertension.  Given marked elevation of blood pressure, plan is to start lisinopril 10 mg daily if patient can tolerate by mouth; use IV metoprolol if needed for severe blood pressure elevations.

## 2013-11-14 NOTE — Progress Notes (Signed)
INITIAL NUTRITION ASSESSMENT  DOCUMENTATION CODES Per approved criteria  -Not Applicable   INTERVENTION: Diet advancement per MD discretion Add Ensure BID with meals twice daily when diet is advanced, chocolate, each supplement providing 350 kcal and 13 grams of protein  Diet Education on Diabetes Diet   NUTRITION DIAGNOSIS: Inadequate oral intake related to loss of appetite and abdominal pain as evidenced by pt report of <50% meal intake for 2 months.   Goal: Meet >/=90% of estimated nutrition needs when diet is liberalized   Monitor:  Diet advancement, PO intake, supplement tolerance, weight trends  Reason for Assessment: Positive Malnutrition Screening Tool Score = 3   38 y.o. female  Admitting Dx: Nausea & vomiting  ASSESSMENT: Patient is a 38 year old woman with poorly controlled diabetes mellitus, admitted with recurrent nausea and vomiting which began one day prior to admission.  During assessment, Pt reports that she has lost 30 pounds in two months, however per MD note, she reported that she has lost 15 pounds. The patient admits to being poorly compliant with her blood sugar control and does not follow a diabetic diet at home. She explains that she has not had an appetite for a couple of months and does not eat a regular 3 meals each day.  Pt explained that she does not have breakfast, she will snack in the morning on peanut butter cracker and chips, will sometimes skip lunch, and for dinner will only eat 25% of meal (usually the protein source).   Pt reports weight loss, however, after conducting the physical exam, I see no signs of significant weight loss. Pt seems well nourished with no signs of muscle mass or fat wasting.   Pt reports that she would like to try Ensure supplement when she can eat to help supplement her calories and protein intake. I instructed her to consume the Ensure with meals or with some type of protein source because of her blood sugar control  issue. She agreed that she would do this.   Pt is non compliant with diabetic diet, and explains that she has been educated before and knows what she needs to do. She agreed to receive a handout for additional information. "Carbohydrate Counting for People with Diabetes" handout from the [employer] was given to the pt.   Labs reviewed: High CBG (237)   Height: Ht Readings from Last 1 Encounters:  12/05/12 5' 6.5" (1.689 m)   Pt reports her height is 5'5"  Weight: Wt Readings from Last 1 Encounters:  12/05/12 168 lb 12.8 oz (76.567 kg)  Pt reports her current weight is 157 lbs   Ideal Body Weight: 125 lbs   % Ideal Body Weight: 125%   Wt Readings from Last 10 Encounters:  12/05/12 168 lb 12.8 oz (76.567 kg)  09/09/06 191 lb 14.4 oz (87.045 kg)    Usual Body Weight: 185 lbs, per pt report  % Usual Body Weight: 85%   BMI:  Calculated BMI based off of pt report: 26   Estimated Nutritional Needs: Kcal: 1800 - 2000 Protein: 90 - 100 g  Fluid: >/=1.8 L   Skin: WDL  Diet Order: NPO  EDUCATION NEEDS: -Education needs addressed   Intake/Output Summary (Last 24 hours) at 11/14/13 1636 Last data filed at 11/14/13 0700  Gross per 24 hour  Intake   3020 ml  Output      0 ml  Net   3020 ml    Last BM: 4/5  Labs: Lab Results  Component Value Date   HGBA1C 10.0* 11/13/2013    Recent Labs Lab 11/12/13 1942 11/13/13 2110 11/14/13 0730  NA 138 140 142  K 3.1* 3.9 4.0  CL 99 104 108  CO2 21 19 20   BUN 12 12 11   CREATININE 0.72 0.68 0.74  CALCIUM 9.0 8.2* 8.5  GLUCOSE 262* 209* 174*    CBG (last 3)   Recent Labs  11/13/13 2229 11/14/13 0651 11/14/13 1121  GLUCAP 226* 155* 188*    Scheduled Meds: . enoxaparin (LOVENOX) injection  40 mg Subcutaneous Q24H  . insulin aspart  0-9 Units Subcutaneous TID WC  . lisinopril  10 mg Oral Daily  . pneumococcal 23 valent vaccine  0.5 mL Intramuscular Tomorrow-1000  . senna  1 tablet Oral  BID    Continuous Infusions: . 0.9 % NaCl with KCl 40 mEq / L 150 mL/hr at 11/14/13 0830    Past Medical History  Diagnosis Date  . Depression   . Type II diabetes mellitus     Past Surgical History  Procedure Laterality Date  . Tubal ligation  2002    Carrolyn Leigh, BS Nutrition Intern Pager: [phone]

## 2013-11-14 NOTE — Progress Notes (Signed)
Inpatient Diabetes Program Recommendations  AACE/ADA: New Consensus Statement on Inpatient Glycemic Control (2013)  Target Ranges:  Prepandial:   less than 140 mg/dL      Peak postprandial:   less than 180 mg/dL (1-2 hours)      Critically ill patients:  140 - 180 mg/dL   Reason for Visit: Elevated A1C  Diabetes history: Type 2 diabetes Outpatient Diabetes medications: Metformin 1000 mg bid Current orders for Inpatient glycemic control: Novolog sensitive correction tid with meals- patient is NPO  Note:  Patient's A1C is 10.0 indicative of a mean glucose of about 240 mg/dl.  Attempted to discuss elevated A1C with patient.  Patient made effort to talk with me, but obviously uncomfortable with continued nausea.  Was able to find out that patient has a glucose meter and supplies at home but not not check CBG's regularly-- although she knows she should.  Has seen the CDE in the Clinic before, [facility], but when I mentioned the possibility of her returning to see CDE, patient stated that she goes with her two children who have diabetes to see Dr. Tobe Sos and implied that making another appointment with Ms. Plyer for herself would be a hardship.  Encouraged patient to consider seeking outpatient diabetes management help for herself as well.    Request MD consider changing CBG's and correction to q 4 hours while NPO.   Thank you.  Mahdi Frye S. Marcelline Mates, RN, MSN, CDE Inpatient Diabetes Program, team pager [phone]

## 2013-11-14 NOTE — Consult Note (Signed)
Reason for Consult: Nausea and vomiting Referring Physician: Teaching Service  Kathy Frank HPI: This is a 38 year old female with a PMH of DM since 1995 admitted for acute worsening of her nausea and vomiting.  She states that for the past several months she has had minor episodes of nausea and vomiting, but then it acutely worsened yesterday.  As a result of her symptoms she presented to the ER for further evaluation.  In the prior months the use of Alka Seltzer helped her symptoms and she took a dose today when her son brought in the medication.  She feels better as a result.  The patient has an A1C of 10 and she admits to being poorly compliant with her blood sugar control.  Additionally, over the past two months she reports a 15 lbs weight loss.  Past Medical History  Diagnosis Date  . Depression   . Type II diabetes mellitus     Past Surgical History  Procedure Laterality Date  . Tubal ligation  2002    History reviewed. No pertinent family history.  Social History:  reports that she has been smoking Cigarettes.  She has a 1 pack-year smoking history. She has never used smokeless tobacco. She reports that she drinks alcohol. She reports that she does not use illicit drugs.  Allergies: No Known Allergies  Medications:  Scheduled: . enoxaparin (LOVENOX) injection  40 mg Subcutaneous Q24H  . insulin aspart  0-9 Units Subcutaneous TID WC  . lisinopril  10 mg Oral Daily  . pneumococcal 23 valent vaccine  0.5 mL Intramuscular Tomorrow-1000  . senna  1 tablet Oral BID   Continuous: . 0.9 % NaCl with KCl 40 mEq / L 150 mL/hr at 11/14/13 0830    Results for orders placed during the hospital encounter of 11/12/13 (from the past 24 hour(s))  BASIC METABOLIC PANEL     Status: Abnormal   Collection Time    11/13/13  9:10 PM      Result Value Ref Range   Sodium 140  137 - 147 mEq/L   Potassium 3.9  3.7 - 5.3 mEq/L   Chloride 104  96 - 112 mEq/L   CO2 19  19 - 32 mEq/L   Glucose, Bld 209 (*) 70 - 99 mg/dL   BUN 12  6 - 23 mg/dL   Creatinine, Ser 0.68  0.50 - 1.10 mg/dL   Calcium 8.2 (*) 8.4 - 10.5 mg/dL   GFR calc non Af Amer >90  >90 mL/min   GFR calc Af Amer >90  >90 mL/min  GLUCOSE, CAPILLARY     Status: Abnormal   Collection Time    11/13/13 10:29 PM      Result Value Ref Range   Glucose-Capillary 226 (*) 70 - 99 mg/dL   Comment 1 Notify RN     Comment 2 Documented in Chart    GLUCOSE, CAPILLARY     Status: Abnormal   Collection Time    11/14/13  6:51 AM      Result Value Ref Range   Glucose-Capillary 155 (*) 70 - 99 mg/dL   Comment 1 Notify RN     Comment 2 Documented in Chart    BASIC METABOLIC PANEL     Status: Abnormal   Collection Time    11/14/13  7:30 AM      Result Value Ref Range   Sodium 142  137 - 147 mEq/L   Potassium 4.0  3.7 - 5.3 mEq/L  Chloride 108  96 - 112 mEq/L   CO2 20  19 - 32 mEq/L   Glucose, Bld 174 (*) 70 - 99 mg/dL   BUN 11  6 - 23 mg/dL   Creatinine, Ser 0.74  0.50 - 1.10 mg/dL   Calcium 8.5  8.4 - 10.5 mg/dL   GFR calc non Af Amer >90  >90 mL/min   GFR calc Af Amer >90  >90 mL/min  GLUCOSE, CAPILLARY     Status: Abnormal   Collection Time    11/14/13 11:21 AM      Result Value Ref Range   Glucose-Capillary 188 (*) 70 - 99 mg/dL     Dg Abd Acute W/chest  11/13/2013   CLINICAL DATA:  Abdominal pain and emesis  EXAM: ACUTE ABDOMEN SERIES (ABDOMEN 2 VIEW & CHEST 1 VIEW)  COMPARISON:  Chest radiograph February 05, 2009  FINDINGS: PA chest: Lungs are clear. Heart size and pulmonary vascularity are normal. No adenopathy.  Supine and upright abdomen: Bowel gas pattern is unremarkable. No obstruction or free air. There is moderate stool in the colon. There are phleboliths in the pelvis.  IMPRESSION: Bowel gas pattern unremarkable.  Lungs clear.   Electronically Signed   By: Lowella Grip M.D.   On: 11/13/2013 08:47    ROS:  As stated above in the HPI otherwise negative.  Blood pressure 201/112, pulse 95,  temperature 98.7 F (37.1 C), temperature source Oral, resp. rate 18, last menstrual period 10/27/2013, SpO2 100.00%.    PE: Gen: NAD, Alert and Oriented HEENT:  [place]/AT, EOMI Neck: Supple, no LAD Lungs: CTA Bilaterally CV: RRR without M/G/R ABM: Soft, NTND, +BS Ext: No C/C/E  Assessment/Plan: 1) Nausea and vomiting. 2) Poorly controlled DM. 3) ? GERD. 4) ? Gastroparesis.   The patient may have an acute gastroparesis or this may be a progression of this disease.  It is difficult to discern if she has gastroparesis without a GES, however, she would not be able to tolerate this procedure.  Regardless, it is clear that her poorly controlled DM is the main role or a significant role in her current presentation. I do not feel she requires an EGD at this time.  I agree with the current management and I would add on a PPI IV until she can tolerate PO.  A candidal esophagitis is a consideration, but only time will tell with her clinical progression.  I did counsel her at length that her blood sugar needs to be under tight control.  Plan: 1) Add on Protonix IV. 2) Continue with emperic Reglan and antiemetics.  Carrah Eppolito D 11/14/2013, 4:28 PM

## 2013-11-14 NOTE — Progress Notes (Signed)
See attending progress note.

## 2013-11-14 NOTE — Progress Notes (Signed)
BP- 189/92. MD paged, no new orders given. Will continue to monitor pt.

## 2013-11-14 NOTE — Progress Notes (Signed)
UR completed. Patient changed to inpatient- requiring IVF @ 150cc/hr, IV pain medication and IV antiemetics

## 2013-11-15 DIAGNOSIS — D649 Anemia, unspecified: Secondary | ICD-10-CM | POA: Diagnosis present

## 2013-11-15 LAB — GLUCOSE, CAPILLARY
Glucose-Capillary: 148 mg/dL — ABNORMAL HIGH (ref 70–99)
Glucose-Capillary: 180 mg/dL — ABNORMAL HIGH (ref 70–99)
Glucose-Capillary: 157 mg/dL — ABNORMAL HIGH (ref 70–99)
Glucose-Capillary: 181 mg/dL — ABNORMAL HIGH (ref 70–99)

## 2013-11-15 MED ORDER — PANTOPRAZOLE SODIUM 40 MG PO TBEC
40.0000 mg | DELAYED_RELEASE_TABLET | Freq: Every day | ORAL | Status: DC
  Administered 2013-11-15 – 2013-11-16 (×2): 40 mg via ORAL
  Filled 2013-11-15 (×2): qty 1

## 2013-11-15 MED ORDER — METOCLOPRAMIDE HCL 5 MG PO TABS
5.0000 mg | ORAL_TABLET | Freq: Three times a day (TID) | ORAL | Status: DC
  Administered 2013-11-15 – 2013-11-16 (×3): 5 mg via ORAL
  Filled 2013-11-15 (×5): qty 1

## 2013-11-15 MED ORDER — BOOST / RESOURCE BREEZE PO LIQD
1.0000 | Freq: Every day | ORAL | Status: DC
  Administered 2013-11-15 – 2013-11-16 (×2): 1 via ORAL

## 2013-11-15 NOTE — Progress Notes (Signed)
   I have seen the patient and reviewed the daily progress note by Bess Harvest, MS 4 and discussed the care of the patient with him.  See my separate for documentation of my findings, assessment, and plans.    LOS: 3 days   Otho Bellows, MD 11/15/2013, 6:48 PM

## 2013-11-15 NOTE — Progress Notes (Signed)
Subjective: GI saw patient yesterday afternoon and recommended addition of an IV PPI, and continuing reglan and antiemetics. Since addition, patient felt better overnight with the remission of her epigastric pain, and without additional episodes of vomiting. She has also begun tolerating clears and will attempt to advance her diet today. Otherwise no additional complaints.   Objective: Vital signs in last 24 hours: Filed Vitals:   11/14/13 2142 11/15/13 0200 11/15/13 0645 11/15/13 1049  BP: 153/87 152/86 142/58 171/108  Pulse: 90 93 94 102  Temp: 99.2 F (37.3 C) 99.1 F (37.3 C) 98.5 F (36.9 C) 98.6 F (37 C)  TempSrc: Oral Oral Oral Oral  Resp: 20 20 18 20   SpO2: 100% 100% 100% 100%   Weight change:   Intake/Output Summary (Last 24 hours) at 11/15/13 1354 Last data filed at 11/15/13 1100  Gross per 24 hour  Intake   3510 ml  Output      0 ml  Net   3510 ml    Physical Exam  General: resting in bed, appears comfortable  HEENT: PERRL, EOMI, no scleral icterus  Cardiovascular: RRR, normal S1S2, no murmur/rubs/gallops  Pulmonary: Normal work of breathing. Clear to auscultation bilaterally, no wheezes, rales or rhonchi.  Abdomen: Normoactive bowel sounds. Soft, non-tender/non-distention. Extremities: Warm and well perfused, no peripheral edema.  Neuro: Alert and oriented X3, cranial nerves II-XII grossly intact.   Lab Results: BMET    Component Value Date/Time   NA 142 11/14/2013 0730   K 4.0 11/14/2013 0730   CL 108 11/14/2013 0730   CO2 20 11/14/2013 0730   GLUCOSE 174* 11/14/2013 0730   BUN 11 11/14/2013 0730   CREATININE 0.74 11/14/2013 0730   CALCIUM 8.5 11/14/2013 0730   GFRNONAA >90 11/14/2013 0730   GFRAA >90 11/14/2013 0730    Medications: I have reviewed the patient's current medications. Scheduled Meds: . enoxaparin (LOVENOX) injection  40 mg Subcutaneous Q24H  . famotidine (PEPCID) IV  20 mg Intravenous Q12H  . feeding supplement (RESOURCE BREEZE)  1 Container Oral Q  lunch  . insulin aspart  0-9 Units Subcutaneous TID WC  . lisinopril  10 mg Oral Daily  . metoCLOPramide  5 mg Oral TID AC  . pantoprazole  40 mg Oral Daily  . pneumococcal 23 valent vaccine  0.5 mL Intramuscular Tomorrow-1000  . senna  1 tablet Oral BID   Continuous Infusions: . 0.9 % NaCl with KCl 40 mEq / L 150 mL/hr at 11/14/13 1700   PRN Meds:.acetaminophen, acetaminophen, metoprolol, morphine injection, promethazine, promethazine, promethazine   Assessment/Plan:  **Nausea/Vomiting: Patient with intractable vomiting since 1 day prior to admission. Initially without abdominal pain but has developed mild to moderate abdominal pain and tenderness with the continuation of vomiting. 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 Vomiting continued despite initial zofran and reglan (difficulty tolerating po). GI was consulted and recommended instituting a PPI IV and continuing reglan and the antiemetics. Pepcid was initiated (4/7), reglan was continued, and phenergen was started which led to improvement of symptoms. Abdominal/epigastric pain resolved, nausea/vomiting remitted, and patient began tolerating clear diet. Will advance diet as tolerated. - Continue reglan and phenergan - Continue pepcid - Will follow-up with Dr. Benson Norway of GI as outpatient for possible Gastric Emptying Study - GI: Thank you for recommendations  **DM2 (uncontrolled): Glucose of 262 on admission and Hgb A1c of 10. She's currently taking Metformin 100mg  BID at home (held here) but with the proteinuria  and current elevations in BP, Lisinopril 10mg  was started.  SSI for maintenence. - Continue SSI, monitor glucose levels - Started lisinopril 10mg  q6h today    **Elevated Blood Pressure: BP was elevated on admission likely due to N/V and abdominal pain. BP stabilized with cessation of abdominal pain/vomiting and addition of Lisinopril 10mg  (4/7). - Will monitor today    **Anion Gap of 18: She originally had an Anion Gap of 18 - thought to be due to starvation ketosis over DKA (especially given the glucose level of 262). Lactate level was normal and the gap has since resolved.   Dispo: Disposition is deferred at this time, awaiting improvement of current medical problems. Anticipated discharge in 0-1 days depending on advancement of diet.     The patient does have a current PCP Rebecca Eaton, MD) and does need an [facility] hospital follow-up appointment after discharge.  The patient does not have transportation limitations that hinder transportation to clinic appointments.   This is a Careers information officer Note.  The care of the patient was discussed with Dr. Eulas Post and the assessment and plan formulated with their assistance.  Please see their attached note for official documentation of the daily encounter.   LOS: 3 days   Isac Sarna, Med Student 11/15/2013, 1:54 PM

## 2013-11-15 NOTE — Progress Notes (Signed)
Subjective: Hypertensive overnight and was given 5mg  IV Metoprolol. BP betyer today. No more emesis or nausea this morning. Abd pain better. Tolerating clears and crackers.  Objective: Vital signs in last 24 hours: Filed Vitals:   11/14/13 2142 11/15/13 0200 11/15/13 0645 11/15/13 1049  BP: 153/87 152/86 142/58 171/108  Pulse: 90 93 94 102  Temp: 99.2 F (37.3 C) 99.1 F (37.3 C) 98.5 F (36.9 C) 98.6 F (37 C)  TempSrc: Oral Oral Oral Oral  Resp: 20 20 18 20   SpO2: 100% 100% 100% 100%   Weight change:   Intake/Output Summary (Last 24 hours) at 11/15/13 1226 Last data filed at 11/15/13 1100  Gross per 24 hour  Intake   3510 ml  Output      0 ml  Net   3510 ml   Vitals reviewed. General: Resting in bed, NAD HEENT: PERRL, EOMI Cardiac: RRR Pulm: CTAB Abd: Soft, nondistended Ext: warm and well perfused, no pedal edema Neuro: alert and oriented X3, cranial nerves II-XII grossly intact, non-focal  Lab Results: Basic Metabolic Panel:  Recent Labs Lab 11/13/13 2110 11/14/13 0730  NA 140 142  K 3.9 4.0  CL 104 108  CO2 19 20  GLUCOSE 209* 174*  BUN 12 11  CREATININE 0.68 0.74  CALCIUM 8.2* 8.5   Liver Function Tests:  Recent Labs Lab 11/12/13 1942  AST 16  ALT 11  ALKPHOS 70  BILITOT 0.3  PROT 7.7  ALBUMIN 3.3*    Recent Labs Lab 11/12/13 1942  LIPASE 40   CBC:  Recent Labs Lab 11/12/13 1942  WBC 7.7  NEUTROABS 5.7  HGB 12.7  HCT 35.0*  MCV 82.4  PLT 295   CBG:  Recent Labs Lab 11/14/13 0651 11/14/13 1121 11/14/13 1650 11/14/13 2140 11/15/13 0643 11/15/13 1119  GLUCAP 155* 188* 237* 105* 148* 180*   Hemoglobin A1C:  Recent Labs Lab 11/13/13 1200  HGBA1C 10.0*   Urine Drug Screen: Drugs of Abuse     Component Value Date/Time   LABOPIA NONE DETECTED 11/13/2013 0957   COCAINSCRNUR NONE DETECTED 11/13/2013 0957   LABBENZ NONE DETECTED 11/13/2013 0957   AMPHETMU NONE DETECTED 11/13/2013 0957   THCU POSITIVE* 11/13/2013 0957   LABBARB NONE DETECTED 11/13/2013 0957    Urinalysis:  Recent Labs Lab 11/12/13 1935  COLORURINE YELLOW  LABSPEC 1.019  PHURINE 8.0  GLUCOSEU 250*  HGBUR MODERATE*  BILIRUBINUR NEGATIVE  KETONESUR 15*  PROTEINUR >300*  UROBILINOGEN 0.2  NITRITE NEGATIVE  LEUKOCYTESUR NEGATIVE    Medications: I have reviewed the patient's current medications. Scheduled Meds: . enoxaparin (LOVENOX) injection  40 mg Subcutaneous Q24H  . famotidine (PEPCID) IV  20 mg Intravenous Q12H  . feeding supplement (RESOURCE BREEZE)  1 Container Oral Q lunch  . insulin aspart  0-9 Units Subcutaneous TID WC  . lisinopril  10 mg Oral Daily  . pantoprazole  40 mg Oral Daily  . pneumococcal 23 valent vaccine  0.5 mL Intramuscular Tomorrow-1000  . senna  1 tablet Oral BID   Continuous Infusions: . 0.9 % NaCl with KCl 40 mEq / L 150 mL/hr at 11/14/13 1700   PRN Meds:.acetaminophen, acetaminophen, metoprolol, morphine injection, promethazine, promethazine, promethazine  Assessment/Plan: 33o F with PMH uncontrolled DM2 presents with nausea and vomiting concerning for gastroparesis.  # Nausea and vomiting: Improved. Tolerating small amounts of clears and crackers. Advancing diet. Will continue Reglan per GI recommendations.  - Clears, advance as tolerated - Reglan 5mg  TID AC - F/i  with GI, appreciate recommendations  #HTN: Significantly elevated BP likely 2/2 abdominal pain and emesis. BP better controlled today now that her abdominal pain and nausea improved. Lisinopril started on 4/7. May need to up-titrate depending on how her BP does today.  - Continue Lisinopril 10mg  daily  #DM2, uncontrolled: Pt on Metformin only at home. A1c 10.00 this admission. She is currently on SSI and her CBGs have been fairly well controlled. Will add back her Metformin once she is tolerating po completely.  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 Encouraged abstinence as this could make her nausea and  vomiting worse with frequent use.   PLEASE REFER TO MS-4 NOTE FOR THE REMAINDER OF THE PLAN   Dispo: Disposition is deferred at this time, awaiting improvement of current medical problems.  Anticipated discharge in approximately 0-1 day(s).   The patient does have a current PCP Rebecca Eaton, MD) and does need an [facility] hospital follow-up appointment after discharge.  The patient does not have transportation limitations that hinder transportation to clinic appointments.  .Services Needed at time of discharge: Y = Yes, Blank = No PT:   OT:   RN:   Equipment:   Other:     LOS: 3 days   Otho Bellows, MD 11/15/2013, 12:26 PM

## 2013-11-15 NOTE — Discharge Summary (Signed)
Name: Kathy Frank MRN: [id number] DOB: [birth year] 38 y.o. PCP: Rebecca Eaton, MD  Date of Admission: 11/12/2013 11:58 PM Date of Discharge: 11/16/2013 Attending Physician: Axel Filler, MD  Discharge Diagnosis: Principal Problem:   Nausea & vomiting Active Problems:   Diabetes mellitus type II, uncontrolled   TOBACCO USER   Elevated blood pressure   Normocytic anemia   Hypertension  Discharge Medications:   Medication List         BC HEADACHE 325-95-16 MG Tabs  Generic drug:  Aspirin-Salicylamide-Caffeine  Take 1 Package by mouth daily as needed (for pain).     lisinopril 20 MG tablet  Commonly known as:  PRINIVIL,ZESTRIL  Take 1 tablet (20 mg total) by mouth daily.     metFORMIN 500 MG tablet  Commonly known as:  GLUCOPHAGE  Take 2 tablets (1,000 mg total) by mouth 2 (two) times daily with a meal.     metoCLOPramide 5 MG tablet  Commonly known as:  REGLAN  Take 1 tablet (5 mg total) by mouth 3 (three) times daily before meals.     pantoprazole 40 MG tablet  Commonly known as:  PROTONIX  Take 1 tablet (40 mg total) by mouth daily.     promethazine 12.5 MG suppository  Commonly known as:  PHENERGAN  Place 1 suppository (12.5 mg total) rectally every 6 (six) hours as needed for nausea.        Disposition and follow-up:   Kathy Frank was discharged from [facility] in Good condition.  At the hospital follow up visit please address:  1.  DM2 control:  Her glucose levels were elevated, but were stabilized during the admission, Hgb A1c found to be 10. Please assess her CBG control and medication and diet compliance.  2. HTN: BPs elevated this admission. Lisinopril was started and increased this admission. Please evaluate blood pressure control and medication compliance.  3.  Labs / imaging needed at time of follow-up: BMP to assess renal function and potassium after starting the ACEi.    4.  Pending labs/ test needing follow-up:  None.   Follow-up Appointments: Follow-up Information   Follow up with Rebecca Eaton, MD On 11/24/2013.   Specialty:  Internal Medicine   Contact information:   [place] [address] [phone]     Follow up with Rebecca Eaton On 11/24/2013 Contact Information: [place] [address] [phone]  Discharge Instructions: Discharge Orders   Future Appointments Provider Department Dept Phone   11/24/2013 1:45 PM Cresenciano Genre, MD [place] [phone]   Future Orders Complete By Expires   Call MD for:  extreme fatigue  As directed    Call MD for:  persistant nausea and vomiting  As directed    Call MD for:  severe uncontrolled pain  As directed    Diet Carb Modified  As directed    Increase activity slowly  As directed      1. Follow-up with your PCP as directed for the management of your diabetes.   Call your healthcare provider if you have new or worsening:   - Increased urination or trouble emptying the bladder  - Increased thirst and dry mouth  - Increased appetite or loss of appetite   - Fast or irregular heartbeat  - Tiredness  - Fruity odor to breath  - Change in vision, such as double vision, blurred vision, or trouble seeing out of one or  both eyes  - Floaters, which are black spots or cobweb-like shapes in your vision  - Numbness in your feet or hands  - Redness, bumps, blisters, or sores on your skin  - Infection that does not go away or frequent infection    Consultations:  GI  Procedures Performed:  Dg Abd Acute W/chest  11/13/2013   CLINICAL DATA:  Abdominal pain and emesis  EXAM: ACUTE ABDOMEN SERIES (ABDOMEN 2 VIEW & CHEST 1 VIEW)  COMPARISON:  Chest radiograph February 05, 2009  FINDINGS: PA chest: Lungs are clear. Heart size and pulmonary vascularity are normal. No adenopathy.  Supine and upright abdomen: Bowel gas pattern is unremarkable. No obstruction or free air. There is moderate  stool in the colon. There are phleboliths in the pelvis.  IMPRESSION: Bowel gas pattern unremarkable.  Lungs clear.   Electronically Signed   By: Lowella Grip M.D.   On: 11/13/2013 08:47    Admission HPI: History of Present Illness:  The patient is a 38 YO female with poorly controlled diabetes. She states that she has been having some nausea upon awaking for about 1 month and yesterday she woke up and started vomiting and was unable to stop. She came to the ED for care and was still unable to tolerate anything by mouth. She denies eating anywhere new or at any food trucks. She denies any diarrhea and states that she may be slightly constipated. She denies any chest pain or SOB. She denies fevers or chills. She states that no one around her is sick. She is not having any leg swelling. She started having some abdominal pain after the multiple episodes of vomiting. She is only taking metformin for her diabetes and does not admit to having any other medical problems.  Review of Systems:  A comprehensive 10 point review of systems was performed and other than pertinent positives and negatives listed above in HPI was negative.  Physical Exam:  Blood pressure 172/95, pulse 103, temperature 98.3 F (36.8 C), temperature source Oral, resp. rate 18, last menstrual period 10/27/2013, SpO2 99.00%.  General: resting in bed, in mild distress  HEENT: PERRL, EOMI, no scleral icterus  Cardiac: S1 S2 normal, slightly fast rate  Pulm: clear to auscultation bilaterally, moving normal volumes of air  Abd: soft, diffusely mildly tender, nondistended, BS present  Ext: warm and well perfused, no pedal edema  Neuro: alert and oriented X3, cranial nerves II-XII grossly intact   Hospital Course by problem list:  #Nausea/Vomiting: Patient with intractable nausea and vomiting beginning 1 day prior to admission, developing abdominal pain 2/2 to her vomiting. Vomiting continued despite initial zofran and reglan  (difficulty tolerating po). GI was consulted and recommended instituting an IV PPI and Reglan in addition to her H2 blocker and antiemetics. By 4/9 her abdominal/epigastric pain resolved, nausea/vomiting remitted, and patient was tolerating a regular diet. In the setting of uncontrolled diabetes (Hgb A1c 10), diabetic gastroparesis is thought to be the likely cause. Good blood sugar control was strongly emphasized. GI felt that the patient could be managed by her PCP and did not require further intervention at this time.    #DM2 (uncontrolled): Glucose of 262 on admission and Hgb A1c of 10. She was on Metformin 100mg  BID at home, which was held on admission.  She was started on SSI and the Metformin was added back prior to d/c home. Lisinopril was started this admission for renal protection in the setting of her diabetes. CBGs were stable prior  to discharge home, and she was discharged with the Metformin and lisinopril. Her PCP will need to closely monitor for medication and diet compliance. - Continue Metformin 1000mg  BID - Continue Lisinopril 20mg  daily - BMP at hospital f/u to assess renal function and potassium  #Hypertension: BP was elevated on admission likely due to N/V and abdominal pain. Lisinopril was started for her BP and for it's renal protective properties in the setting of her DM2, and her BP improved prior to d/c home. This will likely need to increased as an outpatient. - Continue Lisinopril 20mg      #Anion Gap metabolic acidosis: AG 18 on admission thought to be due to starvation ketosis, with ketones in the urine. DKA was thought less likely as her glucose level was only 262. Lactic acid level was normal. Her anion gap resolved prior to d/c home.   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 Encouraged abstinence as this could make her nausea and vomiting worse with frequent use   Discharge Vitals:   BP 187/113  Pulse 99  Temp(Src) 99 F (37.2 C) (Oral)  Resp 18  SpO2  100%  LMP 10/27/2013  Discharge Labs:  Results for orders placed during the hospital encounter of 11/12/13 (from the past 24 hour(s))  GLUCOSE, CAPILLARY     Status: Abnormal   Collection Time    11/15/13  4:21 PM      Result Value Ref Range   Glucose-Capillary 157 (*) 70 - 99 mg/dL   Comment 1 Documented in Chart    GLUCOSE, CAPILLARY     Status: Abnormal   Collection Time    11/15/13  9:31 PM      Result Value Ref Range   Glucose-Capillary 181 (*) 70 - 99 mg/dL   Comment 1 Notify RN     Comment 2 Documented in Chart    BASIC METABOLIC PANEL     Status: Abnormal   Collection Time    11/16/13  4:55 AM      Result Value Ref Range   Sodium 137  137 - 147 mEq/L   Potassium 4.0  3.7 - 5.3 mEq/L   Chloride 105  96 - 112 mEq/L   CO2 18 (*) 19 - 32 mEq/L   Glucose, Bld 134 (*) 70 - 99 mg/dL   BUN 10  6 - 23 mg/dL   Creatinine, Ser 0.74  0.50 - 1.10 mg/dL   Calcium 8.5  8.4 - 10.5 mg/dL   GFR calc non Af Amer >90  >90 mL/min   GFR calc Af Amer >90  >90 mL/min  GLUCOSE, CAPILLARY     Status: Abnormal   Collection Time    11/16/13  6:44 AM      Result Value Ref Range   Glucose-Capillary 154 (*) 70 - 99 mg/dL   Comment 1 Notify RN     Comment 2 Documented in Chart    GLUCOSE, CAPILLARY     Status: Abnormal   Collection Time    11/16/13 11:33 AM      Result Value Ref Range   Glucose-Capillary 210 (*) 70 - 99 mg/dL   Comment 1 Documented in Chart      Signed: Otho Bellows, MD 11/16/2013, 3:15 PM   Time Spent on Discharge: 35 minutes Services Ordered on Discharge: N/A Equipment Ordered on Discharge: N/A

## 2013-11-15 NOTE — Progress Notes (Signed)
Internal Medicine Attending  Date: 11/15/2013  Patient name: Kathy Frank Medical record number: [id number] Date of birth: October 05, 1975 Age: 38 y.o. Gender: female  I saw and evaluated the patient, and discussed her care on A.M rounds with housestaff.  I reviewed the resident's note by Dr. Eulas Post and I agree with the resident's findings and plans as documented in her note.

## 2013-11-16 DIAGNOSIS — I10 Essential (primary) hypertension: Secondary | ICD-10-CM

## 2013-11-16 HISTORY — DX: Essential (primary) hypertension: I10

## 2013-11-16 LAB — BASIC METABOLIC PANEL
BUN: 10 mg/dL (ref 6–23)
CO2: 18 mEq/L — ABNORMAL LOW (ref 19–32)
Calcium: 8.5 mg/dL (ref 8.4–10.5)
Chloride: 105 mEq/L (ref 96–112)
Creatinine, Ser: 0.74 mg/dL (ref 0.50–1.10)
GFR calc Af Amer: >90 mL/min (ref >90)
GFR calc non Af Amer: >90 mL/min (ref >90)
Glucose, Bld: 134 mg/dL — ABNORMAL HIGH (ref 70–99)
Potassium: 4 mEq/L (ref 3.7–5.3)
Sodium: 137 mEq/L (ref 137–147)

## 2013-11-16 LAB — GLUCOSE, CAPILLARY
Glucose-Capillary: 154 mg/dL — ABNORMAL HIGH (ref 70–99)
Glucose-Capillary: 210 mg/dL — ABNORMAL HIGH (ref 70–99)

## 2013-11-16 LAB — HIV ANTIBODY (ROUTINE TESTING W REFLEX): HIV 1&2 Ab, 4th Generation: NONREACTIVE

## 2013-11-16 MED ORDER — LISINOPRIL 20 MG PO TABS
20.0000 mg | ORAL_TABLET | Freq: Every day | ORAL | Status: DC
  Administered 2013-11-16: 20 mg via ORAL
  Filled 2013-11-16: qty 1

## 2013-11-16 MED ORDER — LISINOPRIL 20 MG PO TABS: 20.0000 mg | ORAL_TABLET | Freq: Every day | ORAL | Status: DC

## 2013-11-16 MED ORDER — FAMOTIDINE 20 MG PO TABS: 20.0000 mg | ORAL_TABLET | Freq: Two times a day (BID) | ORAL | Status: DC

## 2013-11-16 MED ORDER — METOCLOPRAMIDE HCL 5 MG PO TABS: 5.0000 mg | ORAL_TABLET | Freq: Three times a day (TID) | ORAL | Status: DC

## 2013-11-16 MED ORDER — PANTOPRAZOLE SODIUM 40 MG PO TBEC: 40.0000 mg | DELAYED_RELEASE_TABLET | Freq: Every day | ORAL | Status: DC

## 2013-11-16 MED ORDER — PROMETHAZINE HCL 12.5 MG RE SUPP: 12.5000 mg | Freq: Four times a day (QID) | RECTAL | Status: DC | PRN

## 2013-11-16 MED ORDER — METFORMIN HCL 500 MG PO TABS
1000.0000 mg | ORAL_TABLET | Freq: Two times a day (BID) | ORAL | Status: DC
  Filled 2013-11-16 (×2): qty 2

## 2013-11-16 MED ORDER — FAMOTIDINE 20 MG PO TABS
20.0000 mg | ORAL_TABLET | Freq: Two times a day (BID) | ORAL | Status: DC
  Administered 2013-11-16: 20 mg via ORAL
  Filled 2013-11-16 (×2): qty 1

## 2013-11-16 NOTE — Progress Notes (Addendum)
Subjective: BP improved overnight but still hypertensive. Nausea yesterday afternoon. No more emesis or nausea this morning, tolerated a regular diet. Abd pain continues to improve. +BM and flatus.  Objective: Vital signs in last 24 hours: Filed Vitals:   11/15/13 2100 11/16/13 0121 11/16/13 0524 11/16/13 0955  BP: 188/105 163/94 159/97 185/92  Pulse:  94 98 100  Temp:  98.3 F (36.8 C) 98.2 F (36.8 C) 98.6 F (37 C)  TempSrc:  Oral Oral Oral  Resp:  18 18 18   SpO2:  100% 100% 100%   Weight change:   Intake/Output Summary (Last 24 hours) at 11/16/13 1008 Last data filed at 11/15/13 1800  Gross per 24 hour  Intake    340 ml  Output      0 ml  Net    340 ml   Vitals reviewed. General: Resting in bed, NAD HEENT: PERRL, EOMI Cardiac: Tachycardic, regular rhythm Pulm: CTAB Abd: Soft, nondistended. Hypoactive bowel sounds Ext: warm and well perfused, no pedal edema Neuro: alert and oriented X3, cranial nerves II-XII grossly intact, non-focal  Lab Results: Basic Metabolic Panel:  Recent Labs Lab 11/14/13 0730 11/16/13 0455  NA 142 137  K 4.0 4.0  CL 108 105  CO2 20 18*  GLUCOSE 174* 134*  BUN 11 10  CREATININE 0.74 0.74  CALCIUM 8.5 8.5   Liver Function Tests:  Recent Labs Lab 11/12/13 1942  AST 16  ALT 11  ALKPHOS 70  BILITOT 0.3  PROT 7.7  ALBUMIN 3.3*    Recent Labs Lab 11/12/13 1942  LIPASE 40   CBC:  Recent Labs Lab 11/12/13 1942  WBC 7.7  NEUTROABS 5.7  HGB 12.7  HCT 35.0*  MCV 82.4  PLT 295   CBG:  Recent Labs Lab 11/14/13 2140 11/15/13 0643 11/15/13 1119 11/15/13 1621 11/15/13 2131 11/16/13 0644  GLUCAP 105* 148* 180* 157* 181* 154*   Hemoglobin A1C:  Recent Labs Lab 11/13/13 1200  HGBA1C 10.0*   Urine Drug Screen: Drugs of Abuse     Component Value Date/Time   LABOPIA NONE DETECTED 11/13/2013 0957   COCAINSCRNUR NONE DETECTED 11/13/2013 0957   LABBENZ NONE DETECTED 11/13/2013 0957   AMPHETMU NONE DETECTED  11/13/2013 0957   THCU POSITIVE* 11/13/2013 0957   LABBARB NONE DETECTED 11/13/2013 0957    Urinalysis:  Recent Labs Lab 11/12/13 1935  COLORURINE YELLOW  LABSPEC 1.019  PHURINE 8.0  GLUCOSEU 250*  HGBUR MODERATE*  BILIRUBINUR NEGATIVE  KETONESUR 15*  PROTEINUR >300*  UROBILINOGEN 0.2  NITRITE NEGATIVE  LEUKOCYTESUR NEGATIVE    Medications: I have reviewed the patient's current medications. Scheduled Meds: . enoxaparin (LOVENOX) injection  40 mg Subcutaneous Q24H  . famotidine (PEPCID) IV  20 mg Intravenous Q12H  . feeding supplement (RESOURCE BREEZE)  1 Container Oral Q lunch  . insulin aspart  0-9 Units Subcutaneous TID WC  . lisinopril  10 mg Oral Daily  . metoCLOPramide  5 mg Oral TID AC  . pantoprazole  40 mg Oral Daily  . pneumococcal 23 valent vaccine  0.5 mL Intramuscular Tomorrow-1000  . senna  1 tablet Oral BID   Continuous Infusions: . 0.9 % NaCl with KCl 40 mEq / L 150 mL/hr (11/16/13 0503)   PRN Meds:.acetaminophen, acetaminophen, metoprolol, morphine injection, promethazine, promethazine, promethazine  Assessment/Plan: 60o F with PMH uncontrolled DM2 presents with nausea and vomiting concerning for gastroparesis.  # Nausea and vomiting: Improved. Possibly diabetic gastroparesis given her uncontrolled diabetes. Reglan restarted with meals yesterday  and pain and nausea improved. Tolerating regular diet this morning.  - Continue PPI - Reglan 5mg  TID AC - F/u with GI, appreciate recommendations  #HTN: Significantly elevated BP likely 2/2 abdominal pain and emesis. BP better controlled today now that her abdominal pain and nausea improved. Lisinopril started on 4/7; increasing the dose today - Increasing to Lisinopril 20mg  daily  #DM2, uncontrolled: Pt on Metformin only at home. A1c 10.00 this admission. She is currently on SSI and her CBGs have been fairly well controlled; she has only required 2u Novolog over the last 24hrs. Of course she has been taking in only  small amounts po before this morning. Adding back Metformin now that she is tolerating po.  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 Encouraged abstinence as this could make her nausea and vomiting worse with frequent use.   PLEASE REFER TO MS-4 NOTE FOR THE REMAINDER OF THE PLAN   Dispo: Possible d/c to home today.   The patient does have a current PCP Rebecca Eaton, MD) and does need an [facility] hospital follow-up appointment after discharge.  The patient does not have transportation limitations that hinder transportation to clinic appointments.  .Services Needed at time of discharge: Y = Yes, Blank = No PT:   OT:   RN:   Equipment:   Other:     LOS: 4 days   Otho Bellows, MD 11/16/2013, 10:08 AM

## 2013-11-16 NOTE — Progress Notes (Signed)
Subjective: Patient feels much better today. There were no episodes of vomiting overnight and her abdominal/epigastric pain has resolved. She desired to advance her diet this morning and was able to tolerate po without issue. BPs have improved but remain elevated. Otherwise no concerns.  Objective: Vital signs in last 24 hours: Filed Vitals:   11/15/13 2027 11/15/13 2100 11/16/13 0121 11/16/13 0524  BP: 189/107 188/105 163/94 159/97  Pulse: 91  94 98  Temp: 99.5 F (37.5 C)  98.3 F (36.8 C) 98.2 F (36.8 C)  TempSrc: Oral  Oral Oral  Resp: 18  18 18   SpO2: 100%  100% 100%   Weight change:   Intake/Output Summary (Last 24 hours) at 11/16/13 0739 Last data filed at 11/15/13 1800  Gross per 24 hour  Intake    340 ml  Output      0 ml  Net    340 ml    Physical Exam  General: resting in bed, appears comfortable  HEENT: PERRL, EOMI, no scleral icterus  Cardiovascular: RRR, normal S1S2, no murmur/rubs/gallops  Pulmonary: Normal work of breathing. Clear to auscultation bilaterally, no wheezes, rales or rhonchi.  Abdomen: Normoactive bowel sounds. Soft, non-tender/non-distention.  Extremities: Warm and well perfused, no peripheral edema.  Neuro: Alert and oriented X3, cranial nerves II-XII grossly intact.    Lab Results: BMET    Component Value Date/Time   NA 137 11/16/2013 0455   K 4.0 11/16/2013 0455   CL 105 11/16/2013 0455   CO2 18* 11/16/2013 0455   GLUCOSE 134* 11/16/2013 0455   BUN 10 11/16/2013 0455   CREATININE 0.74 11/16/2013 0455   CALCIUM 8.5 11/16/2013 0455   GFRNONAA >90 11/16/2013 0455   GFRAA >90 11/16/2013 0455   CBG (last 3)   Recent Labs  11/15/13 1621 11/15/13 2131 11/16/13 0644  GLUCAP 157* 181* 154*     Medications: I have reviewed the patient's current medications. Scheduled Meds: . enoxaparin (LOVENOX) injection  40 mg Subcutaneous Q24H  . famotidine (PEPCID) IV  20 mg Intravenous Q12H  . feeding supplement (RESOURCE BREEZE)  1 Container Oral Q lunch    . insulin aspart  0-9 Units Subcutaneous TID WC  . lisinopril  10 mg Oral Daily  . metoCLOPramide  5 mg Oral TID AC  . pantoprazole  40 mg Oral Daily  . pneumococcal 23 valent vaccine  0.5 mL Intramuscular Tomorrow-1000  . senna  1 tablet Oral BID   Continuous Infusions: . 0.9 % NaCl with KCl 40 mEq / L 150 mL/hr (11/16/13 0503)   PRN Meds:.acetaminophen, acetaminophen, metoprolol, morphine injection, promethazine, promethazine, promethazine   Assessment/Plan:  **Nausea/Vomiting: Patient with intractable vomiting since 1 day prior to admission. Initially without abdominal pain but has developed mild to moderate abdominal pain and tenderness with the continuation of vomiting. 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 Vomiting continued despite initial zofran and reglan (difficulty tolerating po). GI was consulted and recommended instituting a PPI IV and continuing reglan and the antiemetics. Pepcid and protonix were initiated (4/7), reglan was continued, and phenergen was started which led to improvement of symptoms. Within 2 days (4/9), abdominal/epigastric pain resolved, nausea/vomiting remitted, and patient began tolerating advanced diet.  - Continue reglan  - Continue pepcid and protonix - GI: Thank you for recommendations   **Hypertension: BP was elevated on admission thought to be due to N/V and abdominal pain. BP remained elevated but began stabilizing with addition of lisinopril 10mg .  Will discharge on lisinopril 20mg  with follow-up with PCP next week. - Will monitor today   **DM2 (uncontrolled): Glucose of 262 on admission and Hgb A1c of 10. She's currently taking metformin 100mg  BID at home (held here) but with the proteinuria and current elevations in BP, lisinopril was started. SSI for maintenence.  - Continue SSI, monitor glucose levels   **Anion Gap of 18: She originally had an Anion Gap of 18 - thought to be due to  starvation ketosis over DKA (especially given the glucose level of 262). Lactate level was normal and the gap has since resolved.    Dispo: Symptoms improved. Anticipated discharge today.   The patient does have a current PCP Rebecca Eaton, MD) and does need an [facility] hospital follow-up appointment after discharge.  The patient does not have transportation limitations that hinder transportation to clinic appointments.    This is a Careers information officer Note.  The care of the patient was discussed with Dr. Eulas Post and the assessment and plan formulated with their assistance.  Please see their attached note for official documentation of the daily encounter.   LOS: 4 days   Isac Sarna, Med Student 11/16/2013, 7:39 AM

## 2013-11-16 NOTE — Progress Notes (Signed)
   I have seen the patient and reviewed the daily progress note by Bess Harvest, MS 4 and discussed the care of the patient with hime.  See my separate note for documentation of my findings, assessment, and plans.    LOS: 4 days   Otho Bellows, MD 11/16/2013, 11:51 AM

## 2013-11-16 NOTE — Discharge Instructions (Signed)
1. Follow-up with your PCP as directed for the management of your diabetes.   Call your healthcare provider if you have new or worsening:   - Increased urination or trouble emptying the bladder  - Increased thirst and dry mouth  - Increased appetite or loss of appetite   - Fast or irregular heartbeat  - Tiredness  - Fruity odor to breath  - Change in vision, such as double vision, blurred vision, or trouble seeing out of one or both eyes  - Floaters, which are black spots or cobweb-like shapes in your vision  - Numbness in your feet or hands  - Redness, bumps, blisters, or sores on your skin  - Infection that does not go away or frequent infection

## 2013-11-16 NOTE — Progress Notes (Signed)
Pt requesting to eat a regular breakfast in the morning, feels she is ready to advance. Will put order for carb modified diet. Will continue to monitor.

## 2013-11-16 NOTE — Progress Notes (Signed)
D/C orders received. Pt educated on D/C instructions. Verbalized understanding. Pt taken downstairs by staff via wheelchair.

## 2013-11-16 NOTE — Progress Notes (Signed)
Internal Medicine Attending  Date: 11/16/2013  Patient name: Kathy Frank Medical record number: [id number] Date of birth: November 10, 1975 Age: 38 y.o. Gender: female  I saw and evaluated the patient. I reviewed the resident's note by Dr. Eulas Post and I agree with the plans as documented in her note.

## 2013-11-24 ENCOUNTER — Encounter: Payer: Self-pay | Admitting: Internal Medicine

## 2013-11-24 ENCOUNTER — Ambulatory Visit (INDEPENDENT_AMBULATORY_CARE_PROVIDER_SITE_OTHER): Payer: BC Managed Care – PPO | Admitting: Internal Medicine

## 2013-11-24 VITALS — BP 133/84 | HR 95 | Temp 98.5°F | Wt 162.9 lb

## 2013-11-24 DIAGNOSIS — F17200 Nicotine dependence, unspecified, uncomplicated: Secondary | ICD-10-CM

## 2013-11-24 DIAGNOSIS — E1165 Type 2 diabetes mellitus with hyperglycemia: Secondary | ICD-10-CM

## 2013-11-24 DIAGNOSIS — F3289 Other specified depressive episodes: Secondary | ICD-10-CM

## 2013-11-24 DIAGNOSIS — F329 Major depressive disorder, single episode, unspecified: Secondary | ICD-10-CM

## 2013-11-24 DIAGNOSIS — IMO0002 Reserved for concepts with insufficient information to code with codable children: Secondary | ICD-10-CM

## 2013-11-24 DIAGNOSIS — R112 Nausea with vomiting, unspecified: Secondary | ICD-10-CM

## 2013-11-24 DIAGNOSIS — IMO0001 Reserved for inherently not codable concepts without codable children: Secondary | ICD-10-CM

## 2013-11-24 DIAGNOSIS — I10 Essential (primary) hypertension: Secondary | ICD-10-CM

## 2013-11-24 LAB — BASIC METABOLIC PANEL WITH GFR
BUN: 14 mg/dL (ref 6–23)
CO2: 28 mEq/L (ref 19–32)
Calcium: 8.7 mg/dL (ref 8.4–10.5)
Chloride: 99 mEq/L (ref 96–112)
Creat: 1.03 mg/dL (ref 0.50–1.10)
GFR, Est African American: 80 mL/min
GFR, Est Non African American: 70 mL/min
Glucose, Bld: 399 mg/dL — ABNORMAL HIGH (ref 70–99)
Potassium: 3.8 mEq/L (ref 3.5–5.3)
Sodium: 136 mEq/L (ref 135–145)

## 2013-11-24 LAB — GLUCOSE, CAPILLARY: Glucose-Capillary: 388 mg/dL — ABNORMAL HIGH (ref 70–99)

## 2013-11-24 MED ORDER — BUPROPION HCL ER (XL) 150 MG PO TB24: 150.0000 mg | ORAL_TABLET | Freq: Every day | ORAL | Status: DC

## 2013-11-24 MED ORDER — TRAZODONE HCL 100 MG PO TABS: 100.0000 mg | ORAL_TABLET | Freq: Every day | ORAL | Status: DC

## 2013-11-24 MED ORDER — METFORMIN HCL 500 MG PO TABS: 1000.0000 mg | ORAL_TABLET | Freq: Two times a day (BID) | ORAL | Status: DC

## 2013-11-24 NOTE — Progress Notes (Signed)
   Subjective:    Patient ID: Kathy Frank, female    DOB: 14-Feb-1976, 38 y.o.   MRN: [id number]  HPI Comments: 38 y.o Past Medical History Depression, uncontrolled Type II diabetes mellitus (HA1C 10%), HTN (BP 133/84)  She presents for follow up    1) She was recently dc'ed from the hospital for nausea and vomiting with no clear etiology though DM uncontrolled could be gastroparesis and she was THC +.  She denies ab pain.  She was nauseated and vomited yesterday am a total of 3x with water and phelgm contents. She denies vomiting today and states compliance with d/c medications PPI, H2 blocker, reglan, antiemic prn.  She is able to tolerate po 2) DM 2-Her 2 daughter 9 and 17 have DM of note.  She needs Rx refill of metformin 1000 mg bid which she has not been taking 3) She needs Rx refills of Wellbutrin and Trazadone for depression. She was previously given Rx from [name] and wants to restart                SH: She quit smoking cigarettes               Review of Systems  Constitutional: Negative for fever and chills.  Respiratory: Negative for shortness of breath.   Cardiovascular: Negative for chest pain.  Gastrointestinal: Negative for nausea, vomiting and abdominal pain.       Objective:   Physical Exam  Nursing note and vitals reviewed. Constitutional: She is oriented to person, place, and time. Vital signs are normal. She appears well-developed and well-nourished. She is cooperative. No distress.  HENT:  Head: Normocephalic and atraumatic.  Mouth/Throat: Oropharynx is clear and moist and mucous membranes are normal. No oropharyngeal exudate.  Eyes: Conjunctivae are normal. Pupils are equal, round, and reactive to light. Right eye exhibits no discharge. Left eye exhibits no discharge. No scleral icterus.  Cardiovascular: Normal rate, regular rhythm, S1 normal, S2 normal and normal heart sounds.   No murmur heard. No lower ext edema   Pulmonary/Chest: Effort normal and  breath sounds normal. No respiratory distress. She has no wheezes.  Abdominal: Soft. Bowel sounds are normal. There is no tenderness.  Neurological: She is alert and oriented to person, place, and time. Gait normal.  Skin: Skin is warm and dry. No rash noted. She is not diaphoretic.  Psychiatric: She has a normal mood and affect. Her speech is normal and behavior is normal. Judgment and thought content normal. Cognition and memory are normal.          Assessment & Plan:  F/u in 3 months, sooner if needed

## 2013-11-24 NOTE — Patient Instructions (Addendum)
General Instructions: Please read the information below  Take metformin 2 x per day  Pick up other medications  Follow up in 3 months    Treatment Goals:  Goals (1 Years of Data) as of 11/24/13         As of Today 11/16/13 11/16/13 11/16/13 11/16/13     Blood Pressure    . Blood Pressure < 140/90  133/84 187/113 185/92 159/97 163/94     Result Component    . HEMOGLOBIN A1C < 7.0            Progress Toward Treatment Goals:  Treatment Goal 11/24/2013  Hemoglobin A1C improved  Blood pressure at goal    Self Care Goals & Plans:  Self Care Goal 11/24/2013  Manage my medications take my medicines as prescribed; bring my medications to every visit; refill my medications on time; follow the sick day instructions if I am sick  Monitor my health keep track of my blood glucose; keep track of my blood pressure; keep track of my weight; check my feet daily; bring my glucose meter and log to each visit  Eat healthy foods eat more vegetables; eat fruit for snacks and desserts; eat baked foods instead of fried foods; eat smaller portions; eat foods that are low in salt; drink diet soda or water instead of juice or soda  Be physically active find an activity I enjoy  Meeting treatment goals maintain the current self-care plan    Home Blood Glucose Monitoring 11/24/2013  Check my blood sugar no home glucose monitoring  When to check my blood sugar N/A     Care Management & Community Referrals:  Referral 11/24/2013  Referrals made for care management support none needed  Referrals made to community resources none       Nausea and Vomiting Nausea is a sick feeling that often comes before throwing up (vomiting). Vomiting is a reflex where stomach contents come out of your mouth. Vomiting can cause severe loss of body fluids (dehydration). Children and elderly adults can become dehydrated quickly, especially if they also have diarrhea. Nausea and vomiting are symptoms of a condition or disease. It  is important to find the cause of your symptoms. CAUSES   Direct irritation of the stomach lining. This irritation can result from increased acid production (gastroesophageal reflux disease), infection, food poisoning, taking certain medicines (such as nonsteroidal anti-inflammatory drugs), alcohol use, or tobacco use.  Signals from the brain.These signals could be caused by a headache, heat exposure, an inner ear disturbance, increased pressure in the brain from injury, infection, a tumor, or a concussion, pain, emotional stimulus, or metabolic problems.  An obstruction in the gastrointestinal tract (bowel obstruction).  Illnesses such as diabetes, hepatitis, gallbladder problems, appendicitis, kidney problems, cancer, sepsis, atypical symptoms of a heart attack, or eating disorders.  Medical treatments such as chemotherapy and radiation.  Receiving medicine that makes you sleep (general anesthetic) during surgery. DIAGNOSIS Your caregiver may ask for tests to be done if the problems do not improve after a few days. Tests may also be done if symptoms are severe or if the reason for the nausea and vomiting is not clear. Tests may include:  Urine tests.  Blood tests.  Stool tests.  Cultures (to look for evidence of infection).  X-rays or other imaging studies. Test results can help your caregiver make decisions about treatment or the need for additional tests. TREATMENT You need to stay well hydrated. Drink frequently but in small amounts.You may  wish to drink water, sports drinks, clear broth, or eat frozen ice pops or gelatin dessert to help stay hydrated.When you eat, eating slowly may help prevent nausea.There are also some antinausea medicines that may help prevent nausea. HOME CARE INSTRUCTIONS   Take all medicine as directed by your caregiver.  If you do not have an appetite, do not force yourself to eat. However, you must continue to drink fluids.  If you have an  appetite, eat a normal diet unless your caregiver tells you differently.  Eat a variety of complex carbohydrates (rice, wheat, potatoes, bread), lean meats, yogurt, fruits, and vegetables.  Avoid high-fat foods because they are more difficult to digest.  Drink enough water and fluids to keep your urine clear or pale yellow.  If you are dehydrated, ask your caregiver for specific rehydration instructions. Signs of dehydration may include:  Severe thirst.  Dry lips and mouth.  Dizziness.  Dark urine.  Decreasing urine frequency and amount.  Confusion.  Rapid breathing or pulse. SEEK IMMEDIATE MEDICAL CARE IF:   You have blood or brown flecks (like coffee grounds) in your vomit.  You have black or bloody stools.  You have a severe headache or stiff neck.  You are confused.  You have severe abdominal pain.  You have chest pain or trouble breathing.  You do not urinate at least once every 8 hours.  You develop cold or clammy skin.  You continue to vomit for longer than 24 to 48 hours.  You have a fever. MAKE SURE YOU:   Understand these instructions.  Will watch your condition.  Will get help right away if you are not doing well or get worse. Document Released: 07/27/2005 Document Revised: 10/19/2011 Document Reviewed: 12/24/2010 [facility] Patient Information 2014 [place], [place].  Type 2 Diabetes Mellitus, Adult Type 2 diabetes mellitus is a long-term (chronic) disease. In type 2 diabetes:  The pancreas does not make enough of a hormone called insulin.  The cells in the body do not respond as well to the insulin that is made.  Both of the above can happen. Normally, insulin moves sugars from food into tissue cells. This gives you energy. If you have type 2 diabetes, sugars cannot be moved into tissue cells. This causes high blood sugar (hyperglycemia).  HOME CARE  Have your hemoglobin A1c level checked twice a year. The level shows if your diabetes is under  control or out of control.  Perform daily blood sugar testing as told by your doctor.  Check your ketone levels by testing your pee (urine) when you are sick and as told.  Take your diabetes or insulin medicine as told by your doctor.  Never run out of insulin.  Adjust how much insulin you give yourself based on how many carbs (carbohydrates) you eat. Carbs are in many foods, such as fruits, vegetables, whole grains, and dairy products.  Have a healthy snack between every healthy meal. Have 3 meals and 3 snacks a day.  Lose weight if you are overweight.  Carry a medical alert card or wear your medical alert jewelry.  Carry a 15 gram carb snack with you at all times. Examples include:  Glucose pills, 3 or 4.  Glucose gel, 15 gram tube.  Raisins, 2 tablespoons (24 grams).  Jelly beans, 6.  Animal crackers, 8.  Sugar pop, 4 ounces (120 milliliters).  Gummy treats, 9.  Notice low blood sugar (hypoglycemia) symptoms, such as:  Shaking (tremors).  Decreased ability to think clearly.  Sweating.  Increased heart rate.  Headache.  Dry mouth.  Hunger.  Crabbiness (irritability).  Being worried or tense (anxiety).  Restless sleep.  A change in speech or coordination.  Confusion.  Treat low blood sugar right away. If you are alert and can swallow, follow the 15:15 rule:  Take 15 20 grams of a rapid-acting glucose or carb. This includes glucose gel, glucose pills, or 4 ounces (120 milliliters) of fruit juice, regular pop, or low-fat milk.  Check your blood sugar level after taking the glucose.  Take 15 20 grams of more glucose if the repeat blood sugar level is still 70 mg/dL (milligrams/deciliter) or below.  Eat a meal or snack within 1 hour of the blood sugar levels going back to normal.  Notice early symptoms of high blood sugar, such as:  Being really thirsty or drinking a lot (polydipsia).  Peeing (urinating) a lot (polyuria).  Do at least 150  minutes of physical activity a week or as told.  Split the 150 minutes of activity up during the week. Do not do 150 minutes of activity in one day.  Perform exercises, such as weight lifting, at least 2 times a week or as told.  Adjust your insulin or food intake as needed if you start a new exercise or sport.  Follow your sick day plan when you are not able to eat or drink as usual.  Avoid tobacco use.  Women who are not pregnant should drink no more than 1 drink a day. Men should drink no more than 2 drinks a day.  Only drink alcohol with food.  Ask your doctor if alcohol is safe for you.  Tell your doctor if you drink alcohol several times during the week.  See your doctor regularly.  Schedule an eye exam soon after you are diagnosed with diabetes. Schedule exams once every year.  Check your skin and feet every day. Check for cuts, bruises, redness, nail problems, bleeding, blisters, or sores. A doctor should do a foot exam once a year.  Brush your teeth and gums twice a day. Floss once a day. Visit your dentist regularly.  Share your diabetes plan with your workplace or school.  Stay up-to-date with shots that fight against diseases (immunizations).  Learn how to manage stress.  Get diabetes education and support as needed.  Ask your doctor for special help if:  You need help to maintain or improve how you to do things on your own.  You need help to maintain or improve the quality of your life.  You have foot or hand problems.  You have trouble cleaning yourself, dressing, eating, or doing physical activity. GET HELP RIGHT AWAY IF:  You have trouble breathing.  You have moderate to large ketone levels.  You are unable to eat food or drink fluids for more than 6 hours.  You feel sick to your stomach (nauseous) or throw up (vomit) for more than 6 hours.  Your blood sugar level is over 240 mg/dL.  There is a change in mental status.  You get another  serious illness.  You have watery poop (diarrhea) for more than 6 hours.  You have been sick or have had a fever for 2 or more days and are not getting better.  You have pain when you are physically active. MAKE SURE YOU:  Understand these instructions.  Will watch your condition.  Will get help right away if you are not doing well or get worse. Document Released:  05/05/2008 Document Revised: 05/17/2013 Document Reviewed: 11/25/2012 [facility] Patient Information 2014 [place].  Diabetes and Exercise Exercising regularly is important. It is not just about losing weight. It has many health benefits, such as:  Improving your overall fitness, flexibility, and endurance.  Increasing your bone density.  Helping with weight control.  Decreasing your body fat.  Increasing your muscle strength.  Reducing stress and tension.  Improving your overall health. People with diabetes who exercise gain additional benefits because exercise:  Reduces appetite.  Improves the body's use of blood sugar (glucose).  Helps lower or control blood glucose.  Decreases blood pressure.  Helps control blood lipids (such as cholesterol and triglycerides).  Improves the body's use of the hormone insulin by:  Increasing the body's insulin sensitivity.  Reducing the body's insulin needs.  Decreases the risk for heart disease because exercising:  Lowers cholesterol and triglycerides levels.  Increases the levels of good cholesterol (such as high-density lipoproteins [HDL]) in the body.  Lowers blood glucose levels. YOUR ACTIVITY PLAN  Choose an activity that you enjoy and set realistic goals. Your health care provider or diabetes educator can help you make an activity plan that works for you. You can break activities into 2 or 3 sessions throughout the day. Doing so is as good as one long session. Exercise ideas include:  Taking the dog for a walk.  Taking the stairs instead of the  elevator.  Dancing to your favorite song.  Doing your favorite exercise with a friend. RECOMMENDATIONS FOR EXERCISING WITH TYPE 1 OR TYPE 2 DIABETES   Check your blood glucose before exercising. If blood glucose levels are greater than 240 mg/dL, check for urine ketones. Do not exercise if ketones are present.  Avoid injecting insulin into areas of the body that are going to be exercised. For example, avoid injecting insulin into:  The arms when playing tennis.  The legs when jogging.  Keep a record of:  Food intake before and after you exercise.  Expected peak times of insulin action.  Blood glucose levels before and after you exercise.  The type and amount of exercise you have done.  Review your records with your health care provider. Your health care provider will help you to develop guidelines for adjusting food intake and insulin amounts before and after exercising.  If you take insulin or oral hypoglycemic agents, watch for signs and symptoms of hypoglycemia. They include:  Dizziness.  Shaking.  Sweating.  Chills.  Confusion.  Drink plenty of water while you exercise to prevent dehydration or heat stroke. Body water is lost during exercise and must be replaced.  Talk to your health care provider before starting an exercise program to make sure it is safe for you. Remember, almost any type of activity is better than none. Document Released: 10/17/2003 Document Revised: 03/29/2013 Document Reviewed: 01/03/2013 [facility] Patient Information 2014 [place].  DASH Diet The DASH diet stands for "Dietary Approaches to Stop Hypertension." It is a healthy eating plan that has been shown to reduce high blood pressure (hypertension) in as little as 14 days, while also possibly providing other significant health benefits. These other health benefits include reducing the risk of breast cancer after menopause and reducing the risk of type 2 diabetes, heart disease, colon  cancer, and stroke. Health benefits also include weight loss and slowing kidney failure in patients with chronic kidney disease.  DIET GUIDELINES  Limit salt (sodium). Your diet should contain less than 1500 mg of sodium daily.  Limit refined or processed carbohydrates. Your diet should include mostly whole grains. Desserts and added sugars should be used sparingly.  Include small amounts of heart-healthy fats. These types of fats include nuts, oils, and tub margarine. Limit saturated and trans fats. These fats have been shown to be harmful in the body. CHOOSING FOODS  The following food groups are based on a 2000 calorie diet. See your Registered Dietitian for individual calorie needs. Grains and Grain Products (6 to 8 servings daily)  Eat More Often: Whole-wheat bread, brown rice, whole-grain or wheat pasta, quinoa, popcorn without added fat or salt (air popped).  Eat Less Often: White bread, white pasta, white rice, cornbread. Vegetables (4 to 5 servings daily)  Eat More Often: Fresh, frozen, and canned vegetables. Vegetables may be raw, steamed, roasted, or grilled with a minimal amount of fat.  Eat Less Often/Avoid: Creamed or fried vegetables. Vegetables in a cheese sauce. Fruit (4 to 5 servings daily)  Eat More Often: All fresh, canned (in natural juice), or frozen fruits. Dried fruits without added sugar. One hundred percent fruit juice ( cup [237 mL] daily).  Eat Less Often: Dried fruits with added sugar. Canned fruit in light or heavy syrup. [employer], Fish, and Poultry (2 servings or less daily. One serving is 3 to 4 oz [85-114 g]).  Eat More Often: Ninety percent or leaner ground beef, tenderloin, sirloin. Round cuts of beef, chicken breast, [place] breast. All fish. Grill, bake, or broil your meat. Nothing should be fried.  Eat Less Often/Avoid: Fatty cuts of meat, [place], or chicken leg, thigh, or wing. Fried cuts of meat or fish. Dairy (2 to 3 servings)  Eat More  Often: Low-fat or fat-free milk, low-fat plain or light yogurt, reduced-fat or part-skim cheese.  Eat Less Often/Avoid: Milk (whole, 2%).Whole milk yogurt. Full-fat cheeses. Nuts, Seeds, and Legumes (4 to 5 servings per week)  Eat More Often: All without added salt.  Eat Less Often/Avoid: Salted nuts and seeds, canned beans with added salt. Fats and Sweets (limited)  Eat More Often: Vegetable oils, tub margarines without trans fats, sugar-free gelatin. Mayonnaise and salad dressings.  Eat Less Often/Avoid: Coconut oils, palm oils, butter, stick margarine, cream, half and half, cookies, candy, pie. FOR MORE INFORMATION The Dash Diet Eating Plan: www.dashdiet.org Document Released: 07/16/2011 Document Revised: 10/19/2011 Document Reviewed: 07/16/2011 [facility] Patient Information 2014 [place], [place].  Hypertension As your heart beats, it forces blood through your arteries. This force is your blood pressure. If the pressure is too high, it is called hypertension (HTN) or high blood pressure. HTN is dangerous because you may have it and not know it. High blood pressure may mean that your heart has to work harder to pump blood. Your arteries may be narrow or stiff. The extra work puts you at risk for heart disease, stroke, and other problems.  Blood pressure consists of two numbers, a higher number over a lower, 110/72, for example. It is stated as "110 over 72." The ideal is below 120 for the top number (systolic) and under 80 for the bottom (diastolic). Write down your blood pressure today. You should pay close attention to your blood pressure if you have certain conditions such as:  Heart failure.  Prior heart attack.  Diabetes  Chronic kidney disease.  Prior stroke.  Multiple risk factors for heart disease. To see if you have HTN, your blood pressure should be measured while you are seated with your arm held at the level of the heart.  It should be measured at least twice. A one-time  elevated blood pressure reading (especially in the Emergency Department) does not mean that you need treatment. There may be conditions in which the blood pressure is different between your right and left arms. It is important to see your caregiver soon for a recheck. Most people have essential hypertension which means that there is not a specific cause. This type of high blood pressure may be lowered by changing lifestyle factors such as:  Stress.  Smoking.  Lack of exercise.  Excessive weight.  Drug/tobacco/alcohol use.  Eating less salt. Most people do not have symptoms from high blood pressure until it has caused damage to the body. Effective treatment can often prevent, delay or reduce that damage. TREATMENT  When a cause has been identified, treatment for high blood pressure is directed at the cause. There are a large number of medications to treat HTN. These fall into several categories, and your caregiver will help you select the medicines that are best for you. Medications may have side effects. You should review side effects with your caregiver. If your blood pressure stays high after you have made lifestyle changes or started on medicines,   Your medication(s) may need to be changed.  Other problems may need to be addressed.  Be certain you understand your prescriptions, and know how and when to take your medicine.  Be sure to follow up with your caregiver within the time frame advised (usually within two weeks) to have your blood pressure rechecked and to review your medications.  If you are taking more than one medicine to lower your blood pressure, make sure you know how and at what times they should be taken. Taking two medicines at the same time can result in blood pressure that is too low. SEEK IMMEDIATE MEDICAL CARE IF:  You develop a severe headache, blurred or changing vision, or confusion.  You have unusual weakness or numbness, or a faint feeling.  You have  severe chest or abdominal pain, vomiting, or breathing problems. MAKE SURE YOU:   Understand these instructions.  Will watch your condition.  Will get help right away if you are not doing well or get worse. Document Released: 07/27/2005 Document Revised: 10/19/2011 Document Reviewed: 03/16/2008 [facility] Patient Information 2014 [place].

## 2013-11-25 ENCOUNTER — Encounter: Payer: Self-pay | Admitting: Internal Medicine

## 2013-11-25 NOTE — Assessment & Plan Note (Signed)
Patient want to resume Wellbutrin and Trazadone which helped in the past  Rx Wellbutrin XL 150 mg qd, and Trazadone 100 mg qhs

## 2013-11-25 NOTE — Assessment & Plan Note (Addendum)
Resolved as of today. Preg test neg 11/12/13  No clear etiology could be 2/2 THC use, uncontrolled DM causing gastroparesis Pt to continue Reglan, PPI, H2blocker, prn antiemetic

## 2013-11-25 NOTE — Assessment & Plan Note (Signed)
BP Readings from Last 3 Encounters:  11/24/13 133/84  11/16/13 187/113  07/28/13 127/68    Lab Results  Component Value Date   NA 136 11/24/2013   K 3.8 11/24/2013   CREATININE 1.03 11/24/2013    Assessment: Blood pressure control: controlled Progress toward BP goal:  at goal Comments: improved from prior   Plan: Medications:  continue current medications (lisinopril 20 mg qd)  Educational resources provided: brochure;handout;video;other (see comments) Self management tools provided: other (see comments) Other plans: BMET today f/u in 3 months

## 2013-11-25 NOTE — Assessment & Plan Note (Signed)
Lab Results  Component Value Date   HGBA1C 10.0* 11/13/2013   HGBA1C 11.4 12/05/2012   HGBA1C 9.4 09/09/2006     Assessment: Diabetes control: poor control (HgbA1C >9%) Progress toward A1C goal:  improved Comments: h/o med noncompliance   Plan: Medications:  continue current medications, Rx refill of Metformin 1000 mg bid  Home glucose monitoring: Frequency: no home glucose monitoring Timing: N/A Instruction/counseling given: provided printed educational material Educational resources provided: brochure;handout;other (see comments) Self management tools provided: other (see comments) Other plans: f/u in 3 months

## 2013-11-25 NOTE — Assessment & Plan Note (Addendum)
  Assessment: Progress toward smoking cessation:  Pt quit  Barriers to progress toward smoking cessation:  N/A Comments: none Plan: Instruction/counseling given:  I commended patient for quitting and reviewed strategies for preventing relapses. Educational resources provided: No  Self management tools provided: n/a; She used Bupropion in the past  Medications to assist with smoking cessation:  None Patient agreed to the following self-care plans for smoking cessation: n/a  Other plans: keep reassessing

## 2013-11-27 NOTE — Progress Notes (Signed)
I have reviewed the problem list, physical findings and medications with resident physician Dr Morrison Old and I con cur with the management plan. Murriel Hopper, MD, [place]  Hematology-Oncology/Internal Medicine

## 2013-12-18 ENCOUNTER — Telehealth: Payer: Self-pay | Admitting: *Deleted

## 2013-12-18 ENCOUNTER — Ambulatory Visit: Payer: BC Managed Care – PPO | Admitting: Internal Medicine

## 2013-12-18 NOTE — Telephone Encounter (Signed)
Pt called had to leave work today - having upper abd pain - under breast area and vomiting x 4 today. Pain started this AM - appt sch today 12/18/13 with Dr Denton Brick 2:15PM.  If pain and vomiting gets worse suggest ER. Hilda Blades Jie Stickels RN 12/18/13 9:45AM

## 2013-12-20 ENCOUNTER — Observation Stay (HOSPITAL_COMMUNITY)
Admission: EM | Admit: 2013-12-20 | Discharge: 2013-12-21 | Disposition: A | Discharge status: Home / Self Care | Payer: BC Managed Care – PPO | Attending: Internal Medicine | Admitting: Internal Medicine

## 2013-12-20 ENCOUNTER — Encounter (HOSPITAL_COMMUNITY): Payer: Self-pay | Admitting: Emergency Medicine

## 2013-12-20 DIAGNOSIS — F3289 Other specified depressive episodes: Secondary | ICD-10-CM | POA: Insufficient documentation

## 2013-12-20 DIAGNOSIS — F1210 Cannabis abuse, uncomplicated: Secondary | ICD-10-CM

## 2013-12-20 DIAGNOSIS — IMO0001 Reserved for inherently not codable concepts without codable children: Secondary | ICD-10-CM

## 2013-12-20 DIAGNOSIS — IMO0002 Reserved for concepts with insufficient information to code with codable children: Secondary | ICD-10-CM

## 2013-12-20 DIAGNOSIS — R1115 Cyclical vomiting syndrome unrelated to migraine: Principal | ICD-10-CM | POA: Insufficient documentation

## 2013-12-20 DIAGNOSIS — Z87891 Personal history of nicotine dependence: Secondary | ICD-10-CM | POA: Insufficient documentation

## 2013-12-20 DIAGNOSIS — I10 Essential (primary) hypertension: Secondary | ICD-10-CM | POA: Diagnosis present

## 2013-12-20 DIAGNOSIS — R112 Nausea with vomiting, unspecified: Secondary | ICD-10-CM

## 2013-12-20 DIAGNOSIS — F329 Major depressive disorder, single episode, unspecified: Secondary | ICD-10-CM | POA: Insufficient documentation

## 2013-12-20 DIAGNOSIS — E1165 Type 2 diabetes mellitus with hyperglycemia: Secondary | ICD-10-CM

## 2013-12-20 DIAGNOSIS — R1013 Epigastric pain: Secondary | ICD-10-CM | POA: Insufficient documentation

## 2013-12-20 HISTORY — DX: Essential (primary) hypertension: I10

## 2013-12-20 LAB — URINE MICROSCOPIC-ADD ON

## 2013-12-20 LAB — URINALYSIS, ROUTINE W REFLEX MICROSCOPIC
Bilirubin Urine: NEGATIVE
Glucose, UA: 500 mg/dL — AB
Ketones, ur: 15 mg/dL — AB
Leukocytes, UA: NEGATIVE
Nitrite: NEGATIVE
Protein, ur: 100 mg/dL — AB
Specific Gravity, Urine: 1.016 (ref 1.005–1.030)
Urobilinogen, UA: 0.2 mg/dL (ref 0.0–1.0)
pH: 7 (ref 5.0–8.0)

## 2013-12-20 LAB — CBC
HCT: 35.3 % — ABNORMAL LOW (ref 36.0–46.0)
Hemoglobin: 12.2 g/dL (ref 12.0–15.0)
MCH: 29 pg (ref 26.0–34.0)
MCHC: 34.6 g/dL (ref 30.0–36.0)
MCV: 83.8 fL (ref 78.0–100.0)
Platelets: 364 10*3/uL (ref 150–400)
RBC: 4.21 MIL/uL (ref 3.87–5.11)
RDW: 12.9 % (ref 11.5–15.5)
WBC: 6 10*3/uL (ref 4.0–10.5)

## 2013-12-20 LAB — GLUCOSE, CAPILLARY: Glucose-Capillary: 204 mg/dL — ABNORMAL HIGH (ref 70–99)

## 2013-12-20 LAB — COMPREHENSIVE METABOLIC PANEL
ALT: 9 U/L (ref 0–35)
AST: 13 U/L (ref 0–37)
Albumin: 3.2 g/dL — ABNORMAL LOW (ref 3.5–5.2)
Alkaline Phosphatase: 77 U/L (ref 39–117)
BUN: 18 mg/dL (ref 6–23)
CO2: 24 mEq/L (ref 19–32)
Calcium: 9.7 mg/dL (ref 8.4–10.5)
Chloride: 97 mEq/L (ref 96–112)
Creatinine, Ser: 1.15 mg/dL — ABNORMAL HIGH (ref 0.50–1.10)
GFR calc Af Amer: 70 mL/min — ABNORMAL LOW (ref >90)
GFR calc non Af Amer: 60 mL/min — ABNORMAL LOW (ref >90)
Glucose, Bld: 322 mg/dL — ABNORMAL HIGH (ref 70–99)
Potassium: 3.7 mEq/L (ref 3.7–5.3)
Sodium: 138 mEq/L (ref 137–147)
Total Bilirubin: 0.5 mg/dL (ref 0.3–1.2)
Total Protein: 7.8 g/dL (ref 6.0–8.3)

## 2013-12-20 LAB — RAPID URINE DRUG SCREEN, HOSP PERFORMED
Amphetamines: NOT DETECTED
Barbiturates: NOT DETECTED
Benzodiazepines: NOT DETECTED
Cocaine: NOT DETECTED
Opiates: NOT DETECTED
Tetrahydrocannabinol: POSITIVE — AB

## 2013-12-20 LAB — I-STAT CG4 LACTIC ACID, ED: Lactic Acid, Venous: 1.69 mmol/L (ref 0.5–2.2)

## 2013-12-20 LAB — POC URINE PREG, ED: Preg Test, Ur: NEGATIVE

## 2013-12-20 LAB — LIPASE, BLOOD: Lipase: 41 U/L (ref 11–59)

## 2013-12-20 MED ORDER — SODIUM CHLORIDE 0.9 % IV BOLUS (SEPSIS)
1000.0000 mL | Freq: Once | INTRAVENOUS | Status: AC
  Administered 2013-12-20: 1000 mL via INTRAVENOUS

## 2013-12-20 MED ORDER — GI COCKTAIL ~~LOC~~
30.0000 mL | Freq: Once | ORAL | Status: AC
  Administered 2013-12-20: 30 mL via ORAL
  Filled 2013-12-20: qty 30

## 2013-12-20 MED ORDER — BUPROPION HCL ER (XL) 150 MG PO TB24
150.0000 mg | ORAL_TABLET | Freq: Every day | ORAL | Status: DC
  Administered 2013-12-21: 150 mg via ORAL
  Filled 2013-12-20: qty 1

## 2013-12-20 MED ORDER — HEPARIN SODIUM (PORCINE) 5000 UNIT/ML IJ SOLN
5000.0000 [IU] | Freq: Three times a day (TID) | INTRAMUSCULAR | Status: DC
  Administered 2013-12-20 – 2013-12-21 (×2): 5000 [IU] via SUBCUTANEOUS
  Filled 2013-12-20 (×5): qty 1

## 2013-12-20 MED ORDER — RANITIDINE HCL 150 MG/10ML PO SYRP
150.0000 mg | ORAL_SOLUTION | Freq: Two times a day (BID) | ORAL | Status: DC
  Administered 2013-12-20 – 2013-12-21 (×2): 150 mg via ORAL
  Filled 2013-12-20 (×3): qty 10

## 2013-12-20 MED ORDER — INSULIN ASPART 100 UNIT/ML ~~LOC~~ SOLN
0.0000 [IU] | Freq: Three times a day (TID) | SUBCUTANEOUS | Status: DC
  Administered 2013-12-21: 2 [IU] via SUBCUTANEOUS
  Administered 2013-12-21: 3 [IU] via SUBCUTANEOUS

## 2013-12-20 MED ORDER — METOCLOPRAMIDE HCL 5 MG/ML IJ SOLN
10.0000 mg | Freq: Four times a day (QID) | INTRAMUSCULAR | Status: DC | PRN
  Administered 2013-12-21: 10 mg via INTRAVENOUS
  Filled 2013-12-20: qty 2

## 2013-12-20 MED ORDER — METOCLOPRAMIDE HCL 5 MG/ML IJ SOLN: 10.0000 mg | Freq: Once | INTRAMUSCULAR | Status: DC

## 2013-12-20 MED ORDER — METOCLOPRAMIDE HCL 5 MG/ML IJ SOLN
10.0000 mg | Freq: Once | INTRAMUSCULAR | Status: AC
  Administered 2013-12-20: 10 mg via INTRAVENOUS
  Filled 2013-12-20: qty 2

## 2013-12-20 MED ORDER — PANTOPRAZOLE SODIUM 40 MG IV SOLR
40.0000 mg | Freq: Two times a day (BID) | INTRAVENOUS | Status: DC
  Administered 2013-12-20 – 2013-12-21 (×2): 40 mg via INTRAVENOUS
  Filled 2013-12-20 (×3): qty 40

## 2013-12-20 MED ORDER — ONDANSETRON HCL 4 MG PO TABS: 4.0000 mg | ORAL_TABLET | Freq: Four times a day (QID) | ORAL | Status: DC | PRN

## 2013-12-20 MED ORDER — ONDANSETRON HCL 4 MG/2ML IJ SOLN
4.0000 mg | Freq: Four times a day (QID) | INTRAMUSCULAR | Status: DC | PRN
  Filled 2013-12-20: qty 2

## 2013-12-20 MED ORDER — LORAZEPAM 2 MG/ML IJ SOLN
1.0000 mg | Freq: Once | INTRAMUSCULAR | Status: AC
  Administered 2013-12-20: 1 mg via INTRAVENOUS
  Filled 2013-12-20: qty 1

## 2013-12-20 MED ORDER — ONDANSETRON 4 MG PO TBDP
8.0000 mg | ORAL_TABLET | Freq: Once | ORAL | Status: AC
  Administered 2013-12-20: 8 mg via ORAL
  Filled 2013-12-20: qty 2

## 2013-12-20 NOTE — ED Notes (Signed)
Attempted report 

## 2013-12-20 NOTE — H&P (Signed)
Date: 12/20/2013               Patient Name:  Kathy Frank MRN: [id number]  DOB: [birth year] Age / Sex: 38 y.o., female   PCP: Rebecca Eaton, MD         Medical Service: Internal Medicine Teaching Service         Attending Physician: Dr. Aldine Contes, MD    First Contact: Dr. Lum Babe, MD Pager: [phone]  Second Contact: Dr. Clinton Gallant, MD Pager: [phone]       After Hours (After 5p/  First Contact Pager: [phone]  weekends / holidays): Second Contact Pager: [phone]   Chief Complaint: Vomitting  History of Present Illness: ALIJA Frank is a 38 y.o. women who presents to the ED with recurrent nausea complicated by vomiting. She was recently treated for this same problem. The patient was in her normal state of health until early this morning when she woke up. She vomitting a small amount of partially digested food and a fair amount of white foamy fluid. She has been unable to tolerate PO since that time. This is associated with epigastric abdominal Pain. It does not radiate. Denies previous surgery, recent alcohol use. Admits to intermitted THC consumption, but not in the last few days. States that [facility] normally help her. She is have BMs without problem. No melena or hematochezia.  Meds: Current Facility-Administered Medications  Medication Dose Route Frequency Provider Last Rate Last Dose  . metoCLOPramide (REGLAN) injection 10 mg  10 mg Intravenous Once Osvaldo Shipper, MD      . sodium chloride 0.9 % bolus 1,000 mL  1,000 mL Intravenous Once Osvaldo Shipper, MD       Current Outpatient Prescriptions  Medication Sig Dispense Refill  . buPROPion (WELLBUTRIN XL) 150 MG 24 hr tablet Take 1 tablet (150 mg total) by mouth daily.  30 tablet  2  . famotidine (PEPCID) 20 MG tablet Take 1 tablet (20 mg total) by mouth 2 (two) times daily.  30 tablet  6  . lisinopril (PRINIVIL,ZESTRIL) 20 MG tablet Take 1 tablet (20 mg total) by mouth daily.  30 tablet  6  . metFORMIN  (GLUCOPHAGE) 500 MG tablet Take 2 tablets (1,000 mg total) by mouth 2 (two) times daily with a meal.  60 tablet  2  . metoCLOPramide (REGLAN) 5 MG tablet Take 1 tablet (5 mg total) by mouth 3 (three) times daily before meals.  90 tablet  6  . pantoprazole (PROTONIX) 40 MG tablet Take 1 tablet (40 mg total) by mouth daily.  30 tablet  6  . traZODone (DESYREL) 100 MG tablet Take 1 tablet (100 mg total) by mouth at bedtime.  30 tablet  2    Allergies: Allergies as of 12/20/2013  . (No Known Allergies)   Past Medical History  Diagnosis Date  . Depression   . Type II diabetes mellitus   . Hypertension    Past Surgical History  Procedure Laterality Date  . Tubal ligation  2002   No family history on file. History   Social History  . Marital Status: Single    Spouse Name: N/A    Number of Children: N/A  . Years of Education: N/A   Occupational History  . Not on file.   Social History Main Topics  . Smoking status: Former Smoker -- 0.10 packs/day for 10 years    Types: Cigarettes    Start date: 11/17/2013  . Smokeless tobacco: Never  Used  . Alcohol Use: Yes     Comment: 11/13/2013 "occasionally; once or twice/month"  . Drug Use: No  . Sexual Activity: Not Currently   Other Topics Concern  . Not on file   Social History Narrative  . No narrative on file    Review of Systems: Pertinent items are noted in HPI.  Physical Exam: Blood pressure 148/101, pulse 102, temperature 97.7 F (36.5 C), temperature source Oral, resp. rate 20, height 5\' 5"  (1.651 m), last menstrual period 11/24/2013, SpO2 99.00%. Physical Exam  Constitutional: She is oriented to person, place, and time. She appears well-developed and well-nourished. No distress.  HENT:  Head: Normocephalic and atraumatic.  Mouth/Throat: Oropharynx is clear and moist.  Cardiovascular: Normal rate, regular rhythm and intact distal pulses.  Exam reveals no friction rub.   No murmur heard. Pulmonary/Chest: Effort normal  and breath sounds normal. No respiratory distress. She has no wheezes. She has no rales.  Abdominal: Soft. Bowel sounds are normal. She exhibits no distension. There is tenderness. There is no rebound and no guarding.  Mild epigastric tenderness  Neurological: She is alert and oriented to person, place, and time.  Skin: She is not diaphoretic.  Psychiatric: She has a normal mood and affect. Her behavior is normal.     Lab results: Basic Metabolic Panel:  Recent Labs  12/20/13 1058  NA 138  K 3.7  CL 97  CO2 24  GLUCOSE 322*  BUN 18  CREATININE 1.15*  CALCIUM 9.7   Liver Function Tests:  Recent Labs  12/20/13 1058  AST 13  ALT 9  ALKPHOS 77  BILITOT 0.5  PROT 7.8  ALBUMIN 3.2*    Recent Labs  12/20/13 1058  LIPASE 41   CBC:  Recent Labs  12/20/13 1058  WBC 6.0  HGB 12.2  HCT 35.3*  MCV 83.8  PLT 364   Urine Drug Screen: Drugs of Abuse     Component Value Date/Time   LABOPIA NONE DETECTED 11/13/2013 0957   COCAINSCRNUR NONE DETECTED 11/13/2013 0957   LABBENZ NONE DETECTED 11/13/2013 0957   AMPHETMU NONE DETECTED 11/13/2013 0957   THCU POSITIVE* 11/13/2013 0957   LABBARB NONE DETECTED 11/13/2013 0957    Urinalysis:  Recent Labs  12/20/13 1316  COLORURINE YELLOW  LABSPEC 1.016  PHURINE 7.0  GLUCOSEU 500*  HGBUR MODERATE*  BILIRUBINUR NEGATIVE  KETONESUR 15*  PROTEINUR 100*  UROBILINOGEN 0.2  NITRITE NEGATIVE  LEUKOCYTESUR NEGATIVE   Imaging results:  No results found.  Other results: EKG: none available at this time.  Assessment & Plan by Problem: Principal Problem:   Intractable cyclical vomiting Active Problems:   Hypertension  Nausea Vomiting likely due to DM gastroparesis During recent admission, the patient failed treatment with zofran and reglan PO. GI was consulted who rec IV PPI and reglan in addition to H2 blocker and anti-emetics. Will attempt this treatment again. - IV PPI BID - IV Reglan - H2 blocker - ODT zofran - clears,  advance diet as tolerated   DM2 (uncontrolled) Patient is on metformin 1000 mg BID at home. Reports compliance, but A1c is 10. CBG 322 on admission - hold metformin - SSI  HTN Patient recently started on lisinopril. Mildly htn on admission. Cr at 1.15 , lytes look normal. 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 Admits to continued THC use. Encouraged abstinence as this could make her nausea and vomiting worse with frequent use  Dispo:  Disposition is deferred at this time, awaiting improvement of current medical problems. Anticipated discharge in approximately 2-3 day(s).   The patient does have a current PCP Rebecca Eaton, MD) and does need an [facility] hospital follow-up appointment after discharge.  The patient does not have transportation limitations that hinder transportation to clinic appointments.  Signed: Marrion Coy, MD 12/20/2013, 6:00 PM

## 2013-12-20 NOTE — ED Provider Notes (Signed)
CSN: [id number]     Arrival date & time 12/20/13  1026 History   First MD Initiated Contact with Patient 12/20/13 1107     Chief Complaint  Patient presents with  . Abdominal Pain     (Consider location/radiation/quality/duration/timing/severity/associated sxs/prior Treatment) Patient is a 38 y.o. female presenting with abdominal pain. The history is provided by the patient.  Abdominal Pain Pain location:  Epigastric Pain quality: sharp   Pain radiates to:  Does not radiate Pain severity:  Moderate Onset quality:  Sudden Timing:  Constant Progression:  Unchanged Chronicity:  Recurrent Context: not alcohol use, not diet changes, not eating, not previous surgeries and not sick contacts   Relieved by:  Nothing Worsened by:  Nothing tried Associated symptoms: nausea and vomiting   Associated symptoms: no chills, no cough, no diarrhea, no fever and no shortness of breath     Past Medical History  Diagnosis Date  . Depression   . Type II diabetes mellitus   . Hypertension    Past Surgical History  Procedure Laterality Date  . Tubal ligation  2002   No family history on file. History  Substance Use Topics  . Smoking status: Former Smoker -- 0.10 packs/day for 10 years    Types: Cigarettes    Start date: 11/17/2013  . Smokeless tobacco: Never Used  . Alcohol Use: Yes     Comment: 11/13/2013 "occasionally; once or twice/month"   OB History   Grav Para Term Preterm Abortions TAB SAB Ect Mult Living                 Review of Systems  Constitutional: Negative for fever and chills.  Respiratory: Negative for cough and shortness of breath.   Gastrointestinal: Positive for nausea, vomiting and abdominal pain. Negative for diarrhea.  All other systems reviewed and are negative.     Allergies  Review of patient's allergies indicates no known allergies.  Home Medications   Prior to Admission medications   Medication Sig Start Date End Date Taking? Authorizing Provider   buPROPion (WELLBUTRIN XL) 150 MG 24 hr tablet Take 1 tablet (150 mg total) by mouth daily. 11/24/13  Yes Cresenciano Genre, MD  famotidine (PEPCID) 20 MG tablet Take 1 tablet (20 mg total) by mouth 2 (two) times daily. 11/16/13  Yes Otho Bellows, MD  lisinopril (PRINIVIL,ZESTRIL) 20 MG tablet Take 1 tablet (20 mg total) by mouth daily. 11/16/13  Yes Otho Bellows, MD  metFORMIN (GLUCOPHAGE) 500 MG tablet Take 2 tablets (1,000 mg total) by mouth 2 (two) times daily with a meal. 11/24/13 11/24/14 Yes Cresenciano Genre, MD  metoCLOPramide (REGLAN) 5 MG tablet Take 1 tablet (5 mg total) by mouth 3 (three) times daily before meals. 11/16/13  Yes Otho Bellows, MD  pantoprazole (PROTONIX) 40 MG tablet Take 1 tablet (40 mg total) by mouth daily. 11/16/13  Yes Otho Bellows, MD  traZODone (DESYREL) 100 MG tablet Take 1 tablet (100 mg total) by mouth at bedtime. 11/24/13  Yes Cresenciano Genre, MD   BP 185/90  Pulse 93  Temp(Src) 97.7 F (36.5 C) (Oral)  Resp 16  Ht 5\' 5"  (1.651 m)  SpO2 100%  LMP 11/24/2013 Physical Exam  Nursing note and vitals reviewed. Constitutional: She is oriented to person, place, and time. She appears well-developed and well-nourished. No distress.  HENT:  Head: Normocephalic and atraumatic.  Eyes: EOM are normal. Pupils are equal, round, and reactive to light.  Neck: Normal range  of motion. Neck supple.  Cardiovascular: Normal rate and regular rhythm.  Exam reveals no friction rub.   No murmur heard. Pulmonary/Chest: Effort normal and breath sounds normal. No respiratory distress. She has no wheezes. She has no rales.  Abdominal: Soft. She exhibits no distension. There is tenderness (epigastric. No RUQ pain). There is no rebound.  Musculoskeletal: Normal range of motion. She exhibits no edema.  Neurological: She is alert and oriented to person, place, and time.  Skin: She is not diaphoretic.    ED Course  Procedures (including critical care time) Labs Review Labs Reviewed   COMPREHENSIVE METABOLIC PANEL - Abnormal; Notable for the following:    Glucose, Bld 322 (*)    Creatinine, Ser 1.15 (*)    Albumin 3.2 (*)    GFR calc non Af Amer 60 (*)    GFR calc Af Amer 70 (*)    All other components within normal limits  CBC - Abnormal; Notable for the following:    HCT 35.3 (*)    All other components within normal limits  LIPASE, BLOOD  URINALYSIS, ROUTINE W REFLEX MICROSCOPIC  POC URINE PREG, ED    Imaging Review No results found.   EKG Interpretation None      Angiocath insertion Performed by: Osvaldo Shipper  Consent: Verbal consent obtained. Risks and benefits: risks, benefits and alternatives were discussed Time out: Immediately prior to procedure a "time out" was called to verify the correct patient, procedure, equipment, support staff and site/side marked as required.  Preparation: Patient was prepped and draped in the usual sterile fashion.  Vein Location: L AC  Yes Ultrasound Guided  Gauge: 20g  Normal blood return and flush without difficulty Patient tolerance: Patient tolerated the procedure well with no immediate complications.    MDM   Final diagnoses:  Intractable nausea and vomiting    71M presents with N/V since last night. No diarrhea. Had similar episode and was admitted by Internal Medicine - felt to be possible gastroparesis vs worsening of her diabetes. Patient claims she has been compliant with her medications for HTN and diabetes since discharge. No fevers, no dysuria, no vaginal bleeding.  Patinet here hypertensive, otherwise vitals stable. Will check labs, give anti-emetics and pain meds. Labs ok. Patient able to tolerate PO, however when walking, deeply orthostatic. Patient admitted to internal medicine teaching service.   Osvaldo Shipper, MD 12/20/13 [phone]

## 2013-12-20 NOTE — ED Notes (Signed)
IV team notified of need for IV access. 

## 2013-12-20 NOTE — ED Notes (Signed)
Patient vomiting. Dr. Mingo Amber made aware.

## 2013-12-20 NOTE — ED Notes (Signed)
Patient tolerating sips of gingerale. Pt. Sleeping has drank appx 25ccs. Patient encouraged to continue taking sips.

## 2013-12-20 NOTE — ED Notes (Signed)
Patient unable to void 

## 2013-12-20 NOTE — ED Notes (Signed)
RN attempted IV accesses with 24G in right hand without success.

## 2013-12-20 NOTE — ED Notes (Signed)
Patient ambulated in the hallways without difficulty and steady gait. SpO2 sats maintained 96 and above. Patient tachycardic in the 120's. Pt endorses mild lightheadedness.

## 2013-12-20 NOTE — ED Notes (Signed)
Lab called to add on lipase.

## 2013-12-20 NOTE — ED Notes (Signed)
IV team at bedside 

## 2013-12-20 NOTE — ED Notes (Signed)
IV team paged.  

## 2013-12-20 NOTE — ED Notes (Signed)
IV team notified patient need for IV access for medications and fluids.

## 2013-12-20 NOTE — ED Notes (Signed)
Patient given gingerale and encouraged to sip

## 2013-12-20 NOTE — ED Notes (Signed)
Pt states she woke up throwing up last night and cannot stop; was seen here and hospitalized recently for same thing.

## 2013-12-21 ENCOUNTER — Encounter (HOSPITAL_COMMUNITY): Payer: Self-pay | Admitting: *Deleted

## 2013-12-21 LAB — CBC
HCT: 30.5 % — ABNORMAL LOW (ref 36.0–46.0)
Hemoglobin: 10.5 g/dL — ABNORMAL LOW (ref 12.0–15.0)
MCH: 28.8 pg (ref 26.0–34.0)
MCHC: 34.4 g/dL (ref 30.0–36.0)
MCV: 83.8 fL (ref 78.0–100.0)
Platelets: 308 10*3/uL (ref 150–400)
RBC: 3.64 MIL/uL — ABNORMAL LOW (ref 3.87–5.11)
RDW: 12.9 % (ref 11.5–15.5)
WBC: 6.3 10*3/uL (ref 4.0–10.5)

## 2013-12-21 LAB — COMPREHENSIVE METABOLIC PANEL
ALT: 8 U/L (ref 0–35)
AST: 12 U/L (ref 0–37)
Albumin: 2.5 g/dL — ABNORMAL LOW (ref 3.5–5.2)
Alkaline Phosphatase: 66 U/L (ref 39–117)
BUN: 13 mg/dL (ref 6–23)
CO2: 23 mEq/L (ref 19–32)
Calcium: 8.8 mg/dL (ref 8.4–10.5)
Chloride: 100 mEq/L (ref 96–112)
Creatinine, Ser: 0.82 mg/dL (ref 0.50–1.10)
GFR calc Af Amer: >90 mL/min (ref >90)
GFR calc non Af Amer: >90 mL/min (ref >90)
Glucose, Bld: 176 mg/dL — ABNORMAL HIGH (ref 70–99)
Potassium: 3.4 mEq/L — ABNORMAL LOW (ref 3.7–5.3)
Sodium: 138 mEq/L (ref 137–147)
Total Bilirubin: 0.4 mg/dL (ref 0.3–1.2)
Total Protein: 6.6 g/dL (ref 6.0–8.3)

## 2013-12-21 LAB — GLUCOSE, CAPILLARY
Glucose-Capillary: 191 mg/dL — ABNORMAL HIGH (ref 70–99)
Glucose-Capillary: 144 mg/dL — ABNORMAL HIGH (ref 70–99)

## 2013-12-21 MED ORDER — FAMOTIDINE 20 MG PO TABS: 20.0000 mg | ORAL_TABLET | Freq: Two times a day (BID) | ORAL | Status: DC

## 2013-12-21 MED ORDER — PANTOPRAZOLE SODIUM 40 MG PO TBEC: 40.0000 mg | DELAYED_RELEASE_TABLET | Freq: Every day | ORAL | Status: DC

## 2013-12-21 MED ORDER — ONDANSETRON 4 MG PO TBDP: 4.0000 mg | ORAL_TABLET | Freq: Three times a day (TID) | ORAL | Status: DC | PRN

## 2013-12-21 MED ORDER — METOCLOPRAMIDE HCL 5 MG PO TABS: 5.0000 mg | ORAL_TABLET | Freq: Three times a day (TID) | ORAL | Status: DC

## 2013-12-21 MED ORDER — POTASSIUM CHLORIDE CRYS ER 20 MEQ PO TBCR
40.0000 meq | EXTENDED_RELEASE_TABLET | Freq: Once | ORAL | Status: AC
  Administered 2013-12-21: 40 meq via ORAL
  Filled 2013-12-21: qty 2

## 2013-12-21 NOTE — Discharge Instructions (Addendum)
Gastroparesis  Gastroparesis is also called slowed stomach emptying (delayed gastric emptying). It is a condition in which the stomach takes too long to empty its contents. It often happens in people with diabetes.  CAUSES  Gastroparesis happens when nerves to the stomach are damaged or stop working. When the nerves are damaged, the muscles of the stomach and intestines do not work normally. The movement of food is slowed or stopped. High blood glucose (sugar) causes changes in nerves and can damage the blood vessels that carry oxygen and nutrients to the nerves. RISK FACTORS  Diabetes.  Post-viral syndromes.  Eating disorders (anorexia, bulimia).  Surgery on the stomach or vagus nerve.  Gastroesophageal reflux disease (rarely).  Smooth muscle disorders (amyloidosis, scleroderma).  Metabolic disorders, including hypothyroidism.  Parkinson disease. SYMPTOMS   Heartburn.  Feeling sick to your stomach (nausea).  Vomiting of undigested food.  An early feeling of fullness when eating.  Weight loss.  Abdominal bloating.  Erratic blood glucose levels.  Lack of appetite.  Gastroesophageal reflux.  Spasms of the stomach wall. Complications can include:  Bacterial overgrowth in stomach. Food stays in the stomach and can ferment and cause bacteria to grow.  Weight loss due to difficulty digesting and absorbing nutrients.  Vomiting.  Obstruction in the stomach. Undigested food can harden and cause nausea and vomiting.  Blood glucose fluctuations caused by inconsistent food absorption. DIAGNOSIS  The diagnosis of gastroparesis is confirmed through one or more of the following tests:  Barium X-rays and scans. These tests look at how long it takes for food to move through the stomach.  Gastric manometry. This test measures electrical and muscular activity in the stomach. A thin tube is passed down the throat into the stomach. The tube contains a wire that takes measurements  of the stomach's electrical and muscular activity as it digests liquids and solid food.  Endoscopy. This procedure is done with a long, thin tube called an endoscope. It is passed through the mouth and gently down the esophagus into the stomach. This tube helps the caregiver look at the lining of the stomach to check for any abnormalities.  Ultrasonography. This can rule out gallbladder disease or pancreatitis. This test will outline and define the shape of the gallbladder and pancreas. TREATMENT   Treatments may include:  Exercise.  Medicines to control nausea and vomiting.  Medicines to stimulate stomach muscles.  Changes in what and when you eat.  Having smaller meals more often.  Eating low-fiber forms of high-fiber foods, such as eating cooked vegetables instead of raw vegetables.  Eating low-fat foods.  Consuming liquids, which are easier to digest.  In severe cases, feeding tubes and intravenous (IV) feeding may be needed. It is important to note that in most cases, treatment does not cure gastroparesis. It is usually a lasting (chronic) condition. Treatment helps you manage the underlying condition so that you can be as healthy and comfortable as possible. Other treatments  A gastric neurostimulator has been developed to assist people with gastroparesis. The battery-operated device is surgically implanted. It emits mild electrical pulses to help improve stomach emptying and to control nausea and vomiting.  The use of botulinum toxin has been shown to improve stomach emptying by decreasing the prolonged contractions of the muscle between the stomach and the small intestine (pyloric sphincter). The benefits are temporary. SEEK MEDICAL CARE IF:   You have diabetes and you are having problems keeping your blood glucose in goal range.  You are having nausea,  vomiting, bloating, or early feelings of fullness with eating.  Your symptoms do not change with a change in  diet. Document Released: 07/27/2005 Document Revised: 11/21/2012 Document Reviewed: 01/03/2009 [facility] Patient Information 2014 [place], [place].   Cyclic Vomiting Syndrome Cyclic vomiting syndrome is a benign condition in which patients experience bouts or cycles of severe nausea and vomiting that last for hours or even days. The bouts of nausea and vomiting alternate with longer periods of no symptoms and generally good health. Cyclic vomiting syndrome occurs mostly in children, but can affect adults. CAUSES  [facility] has no known cause. Each episode is typically similar to the previous ones. The episodes tend to:   Start at about the same time of day.  Last the same length of time.  Present the same symptoms at the same level of intensity. Cyclic vomiting syndrome can begin at any age in children and adults. Cyclic vomiting syndrome usually starts between the ages of 3 and 7 years. In adults, episodes tend to occur less often than they do in children, but they last longer. Furthermore, the events or situations that trigger episodes in adults cannot always be pinpointed as easily as they can in children. There are 4 phases of cyclic vomiting syndrome: 1. Prodrome. The prodrome phase signals that an episode of nausea and vomiting is about to begin. This phase can last from just a few minutes to several hours. This phase is often marked by belly (abdominal) pain. Sometimes taking medicine early in the prodrome phase can stop an episode in progress. However, sometimes there is no warning. A person may simply wake up in the middle of the night or early morning and begin vomiting. 2. Episode. The episode phase consists of:  Severe vomiting.  Nausea.  Gagging (retching). 3. Recovery. The recovery phase begins when the nausea and vomiting stop. Healthy color, appetite, and energy return. 4. Symptom-free interval. The symptom-free interval phase is the period between episodes when no symptoms are  present. TRIGGERS Episodes can be triggered by an infection or event. Examples of triggers include:  Infections.  Colds, allergies, sinus problems, and the flu.  Eating certain foods such as chocolate or cheese.  Foods with monosodium glutamate (MSG) or preservatives.  Fast foods.  Pre-packaged foods.  Foods with low nutritional value (junk foods).  Overeating.  Eating just before going to bed.  Hot weather.  Dehydration.  Not enough sleep or poor sleep quality.  Physical exhaustion.  Menstruation.  Motion sickness.  Emotional stress (school or home difficulties).  Excitement or stress. SYMPTOMS  The main symptoms of cyclic vomiting syndrome are:  Severe vomiting.  Nausea.  Gagging (retching). Episodes usually begin at night or the first thing in the morning. Episodes may include vomiting or retching up to 5 or 6 times an hour during the worst of the episode. Episodes usually last anywhere from 1 to 4 days. Episodes can last for up to 10 days. Other symptoms include:  Paleness.  Exhaustion.  Listlessness.  Abdominal pain.  Loose stools or diarrhea. Sometimes the nausea and vomiting are so severe that a person appears to be almost unconscious. Sensitivity to light, headache, fever, dizziness, may also accompany an episode. In addition, the vomiting may cause drooling and excessive thirst. Drinking water usually leads to more vomiting, though the water can dilute the acid in the vomit, making the episode a little less painful. Continuous vomiting can lead to dehydration, which means that the body has lost excessive water and salts. DIAGNOSIS  Cyclic vomiting syndrome is hard to diagnose because there are no clear tests to identify it. A caregiver must diagnose cyclic vomiting syndrome by looking at symptoms and medical history. A caregiver must exclude more common diseases or disorders that can also cause nausea and vomiting. Also, diagnosis takes time because  caregivers need to identify a pattern or cycle to the vomiting. TREATMENT  Cyclic vomiting syndrome cannot be cured. Treatment varies, but people with cyclic vomiting syndrome should get plenty of rest and sleep and take medications that prevent, stop, or lessen the vomiting episodes and other symptoms. People whose episodes are frequent and long-lasting may be treated during the symptom-free intervals in an effort to prevent or ease future episodes. The symptom-free phase is a good time to eliminate anything known to trigger an episode. For example, if episodes are brought on by stress or excitement, this period is the time to find ways to reduce stress and stay calm. If sinus problems or allergies cause episodes, those conditions should be treated. The triggers listed above should be avoided or prevented. Because of the similarities between migraine and cyclic vomiting syndrome, caregivers treat some people with severe cyclic vomiting syndrome with drugs that are also used for migraine headaches. The drugs are designed to:  Prevent episodes.  Reduce their frequency.  Lessen their severity. HOME CARE INSTRUCTIONS Once a vomiting episode begins, treatment is supportive. It helps to stay in bed and sleep in a dark, quiet room. Severe nausea and vomiting may require hospitalization and intravenous (IV) fluids to prevent dehydration. Relaxing medications (sedatives) may help if the nausea continues. Sometimes, during the prodrome phase, it is possible to stop an episode from happening altogether. Only take over-the-counter or prescription medicines for pain, discomfort or fever as directed by your caregiver. Do not give aspirin to children. During the recovery phase, drinking water and replacing lost electrolytes (salts in the blood) are very important. Electrolytes are salts that the body needs to function well and stay healthy. Symptoms during the recovery phase can vary. Some people find that their  appetites return to normal immediately, while others need to begin by drinking clear liquids and then move slowly to solid food. RELATED COMPLICATIONS The severe vomiting that defines cyclic vomiting syndrome is a risk factor for several complications:  Dehydration Vomiting causes the body to lose water quickly.  Electrolyte imbalance Vomiting also causes the body to lose the important salts it needs to keep working properly.  Peptic esophagitis The tube that connects the mouth to the stomach (esophagus) becomes injured from the stomach acid that comes up with the vomit.  Hematemesis The esophagus becomes irritated and bleeds, so blood mixes with the vomit.  Mallory-Weiss tear The lower end of the esophagus may tear open or the stomach may bruise from vomiting or retching.  Tooth decay The acid in the vomit can hurt the teeth by corroding the tooth enamel. SEEK MEDICAL CARE IF: You have questions or problems. Document Released: 10/05/2001 Document Revised: 10/19/2011 Document Reviewed: 11/03/2010 [facility] Patient Information 2014 [place], [place].

## 2013-12-21 NOTE — Progress Notes (Signed)
Subjective:  Patient reports that she has not had any vomiting since admission. She states that she is feeling mucgh better and would like to be discharged.    Objective: Vital signs in last 24 hours: Filed Vitals:   12/20/13 1733 12/20/13 1924 12/21/13 0018 12/21/13 0608  BP: 148/101 207/108 151/91 171/98  Pulse: 102 99 97 91  Temp:  98.5 F (36.9 C) 98.9 F (37.2 C) 98.4 F (36.9 C)  TempSrc:  Oral    Resp: 20 18 17 19   Height:  5\' 5"  (1.651 m)    SpO2: 99% 97% 100% 100%   Weight change:  No intake or output data in the 24 hours ending 12/21/13 1229 Physical Exam  Constitutional: She appears well-developed and well-nourished. No distress.  Cardiovascular: Normal rate, regular rhythm, normal heart sounds and intact distal pulses.  Exam reveals no friction rub.   No murmur heard. Pulmonary/Chest: Effort normal and breath sounds normal. No respiratory distress. She has no wheezes. She has no rales.  Abdominal: Soft. Bowel sounds are normal. She exhibits no distension. There is no tenderness. There is no rebound and no guarding.  Neurological: She is alert.  Skin: She is not diaphoretic.  Psychiatric: She has a normal mood and affect.    Lab Results: Basic Metabolic Panel:  Recent Labs Lab 12/20/13 1058 12/21/13 0439  NA 138 138  K 3.7 3.4*  CL 97 100  CO2 24 23  GLUCOSE 322* 176*  BUN 18 13  CREATININE 1.15* 0.82  CALCIUM 9.7 8.8   Liver Function Tests:  Recent Labs Lab 12/20/13 1058 12/21/13 0439  AST 13 12  ALT 9 8  ALKPHOS 77 66  BILITOT 0.5 0.4  PROT 7.8 6.6  ALBUMIN 3.2* 2.5*    Recent Labs Lab 12/20/13 1058  LIPASE 41   CBC:  Recent Labs Lab 12/20/13 1058 12/21/13 0439  WBC 6.0 6.3  HGB 12.2 10.5*  HCT 35.3* 30.5*  MCV 83.8 83.8  PLT 364 308   CBG:  Recent Labs Lab 12/20/13 2209 12/21/13 0748 12/21/13 1142  GLUCAP 204* 191* 144*   Urine Drug Screen: Drugs of Abuse     Component Value Date/Time   LABOPIA NONE DETECTED  12/20/2013 2139   COCAINSCRNUR NONE DETECTED 12/20/2013 2139   LABBENZ NONE DETECTED 12/20/2013 2139   AMPHETMU NONE DETECTED 12/20/2013 2139   THCU POSITIVE* 12/20/2013 2139   LABBARB NONE DETECTED 12/20/2013 2139    Urinalysis:  Recent Labs Lab 12/20/13 1316  COLORURINE YELLOW  LABSPEC 1.016  PHURINE 7.0  GLUCOSEU 500*  HGBUR MODERATE*  BILIRUBINUR NEGATIVE  KETONESUR 15*  PROTEINUR 100*  UROBILINOGEN 0.2  NITRITE NEGATIVE  LEUKOCYTESUR NEGATIVE   Medications: I have reviewed the patient's current medications. Scheduled Meds: . buPROPion  150 mg Oral Daily  . heparin  5,000 Units Subcutaneous 3 times per day  . insulin aspart  0-15 Units Subcutaneous TID WC  . pantoprazole (PROTONIX) IV  40 mg Intravenous Q12H  . potassium chloride  40 mEq Oral Once  . ranitidine  150 mg Oral BID   Continuous Infusions:  PRN Meds:.metoCLOPramide (REGLAN) injection, ondansetron (ZOFRAN) IV, ondansetron Assessment/Plan: Principal Problem:   Intractable cyclical vomiting Active Problems:   Hypertension  Nausea and Vomiting  The patients nausea and vomiting is likely due to gastroparesis that has resulted from uncontrolled DM. 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 This will need to be followed up as outpatient. Patient is feeling much better  and requests to be discharged home. The medical team agrees with this plan. Follow up will be arranged for the patient.   DM SSI, will restart home meds on discharge.  HTN Continue home mebs, stable.  Dispo: Anticipate d/c today.  The patient does have a current PCP Rebecca Eaton, MD) and does need an [facility] hospital follow-up appointment after discharge.  The patient does have transportation limitations that hinder transportation to clinic appointments.  .Services Needed at time of discharge: Y = Yes, Blank = No PT:   OT:   RN:   Equipment:   Other:     LOS: 1 day   Marrion Coy, MD 12/21/2013, 12:29  PM

## 2013-12-21 NOTE — H&P (Signed)
Pt seen and examined with Dr. Margart Sickles. In brief, Patient is a 38 year old female with PMH of HTN, DM, depression who presents with abd pain, vomiting * 1 day. Patient states she was feeling well until yesterday morning when she developed sudden onset nausea and vomiting. Patient denies hematemesis. No CP, no sob, no fevers. Pt c/o associated epigastric pain, non radiating, sharp in nature. Denies diarrhea. Today patient feels better and denies symptoms. Remaining ROS negative  Cardio- normal heart sounds, RRR Pul- CTA b/l Abd- Soft, NT, ND, BS + Ext- no pedal edema Gen- AAO*3  Assessment and Plan: I have reviewed Dr. Laroy Apple note in detail and agree with documentation as outlined with the following additions: - Nausea/vomiting now resolved. No further work up for now - Pt with possible gastroparesis. Will need outpatient gastric emptying study - Stable for d/c home today

## 2013-12-21 NOTE — Progress Notes (Signed)
AVS discharge  Instructions were given and went over with patient. Patient was also given perscriptions for pepcid,protonix, reglan, and zofran. Patient stated that she did not have any questions. Staff will assist patient to her transportation.

## 2013-12-21 NOTE — Discharge Summary (Signed)
Name: Kathy Frank MRN: [id number] DOB: [birth year] 38 y.o. PCP: Rebecca Eaton, MD  Date of Admission: 12/20/2013 11:06 AM Date of Discharge: 12/21/2013 Attending Physician: Aldine Contes, MD  Discharge Diagnosis:  Principal Problem:   Intractable cyclical vomiting Active Problems:   Hypertension  Discharge Medications:   Medication List         buPROPion 150 MG 24 hr tablet  Commonly known as:  WELLBUTRIN XL  Take 1 tablet (150 mg total) by mouth daily.     famotidine 20 MG tablet  Commonly known as:  PEPCID  Take 1 tablet (20 mg total) by mouth 2 (two) times daily.     lisinopril 20 MG tablet  Commonly known as:  PRINIVIL,ZESTRIL  Take 1 tablet (20 mg total) by mouth daily.     metFORMIN 500 MG tablet  Commonly known as:  GLUCOPHAGE  Take 2 tablets (1,000 mg total) by mouth 2 (two) times daily with a meal.     metoCLOPramide 5 MG tablet  Commonly known as:  REGLAN  Take 1 tablet (5 mg total) by mouth 3 (three) times daily before meals.     ondansetron 4 MG disintegrating tablet  Commonly known as:  ZOFRAN ODT  Take 1 tablet (4 mg total) by mouth every 8 (eight) hours as needed for nausea or vomiting.     pantoprazole 40 MG tablet  Commonly known as:  PROTONIX  Take 1 tablet (40 mg total) by mouth daily.     traZODone 100 MG tablet  Commonly known as:  DESYREL  Take 1 tablet (100 mg total) by mouth at bedtime.        Disposition and follow-up:   [name] was discharged from [facility] in Stable condition.  At the hospital follow up visit please address:  1.  THC consumption, Nausea, Vomitting, DM  2.  Labs / imaging needed at time of follow-up: CMP, CBC  3.  Pending labs/ test needing follow-up: None  Follow-up Appointments:     Follow-up Information   Follow up with Jenetta Downer, MD On 01/04/2014. (10:15 am)    Specialty:  Internal Medicine   Contact information:   [place]  [phone]       Discharge Instructions: Discharge Orders   Future Appointments Provider Department Dept Phone   01/04/2014 10:15 AM Jenetta Downer, MD [place] [phone]   Future Orders Complete By Expires   Call MD for:  As directed    Diet - low sodium heart healthy  As directed    Discharge instructions  As directed    Increase activity slowly  As directed        Procedures Performed:  No results found.  Admission HPI: Kathy Frank is a 38 y.o. women who presents to the ED with recurrent nausea complicated by vomiting. She was recently treated for this same problem. The patient was in her normal state of health until early this morning when she woke up. She vomitting a small amount of partially digested food and a fair amount of white foamy fluid. She has been unable to tolerate PO since that time. This is associated with epigastric abdominal Pain. It does not radiate. Denies previous surgery, recent alcohol use. Admits to intermitted THC consumption, but not in the last few days. States that [facility] normally help her. She is have BMs without problem. No melena or hematochezia.  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 This will need to be followed up as outpatient. Patient is feeling much better and requests to be discharged home on hospital day 1. Of note, she was treated with IV PPI, IV reglana, PO H2 blocker, and PO zofran. The medical team agrees with this plan. Follow up will be arranged for the patient.    Discharge Vitals:   BP 171/98  Pulse 91  Temp(Src) 98.4 F (36.9 C) (Oral)  Resp 19  Ht 5\' 5"  (1.651 m)  SpO2 100%  LMP 11/24/2013  Discharge Labs:  Results  for orders placed during the hospital encounter of 12/20/13 (from the past 24 hour(s))  URINE RAPID DRUG SCREEN (HOSP PERFORMED)     Status: Abnormal   Collection Time    12/20/13  9:39 PM      Result Value Ref Range   Opiates NONE DETECTED  NONE DETECTED   Cocaine NONE DETECTED  NONE DETECTED   Benzodiazepines NONE DETECTED  NONE DETECTED   Amphetamines NONE DETECTED  NONE DETECTED   Tetrahydrocannabinol POSITIVE (*) NONE DETECTED   Barbiturates NONE DETECTED  NONE DETECTED  GLUCOSE, CAPILLARY     Status: Abnormal   Collection Time    12/20/13 10:09 PM      Result Value Ref Range   Glucose-Capillary 204 (*) 70 - 99 mg/dL   Comment 1 Notify RN     Comment 2 Documented in Chart    COMPREHENSIVE METABOLIC PANEL     Status: Abnormal   Collection Time    12/21/13  4:39 AM      Result Value Ref Range   Sodium 138  137 - 147 mEq/L   Potassium 3.4 (*) 3.7 - 5.3 mEq/L   Chloride 100  96 - 112 mEq/L   CO2 23  19 - 32 mEq/L   Glucose, Bld 176 (*) 70 - 99 mg/dL   BUN 13  6 - 23 mg/dL   Creatinine, Ser 0.82  0.50 - 1.10 mg/dL   Calcium 8.8  8.4 - 10.5 mg/dL   Total Protein 6.6  6.0 - 8.3 g/dL   Albumin 2.5 (*) 3.5 - 5.2 g/dL   AST 12  0 - 37 U/L   ALT 8  0 - 35 U/L   Alkaline Phosphatase 66  39 - 117 U/L   Total Bilirubin 0.4  0.3 - 1.2 mg/dL   GFR calc non Af Amer >90  >90 mL/min   GFR calc Af Amer >90  >90 mL/min  CBC     Status: Abnormal   Collection Time    12/21/13  4:39 AM      Result Value Ref Range   WBC 6.3  4.0 - 10.5 K/uL   RBC 3.64 (*) 3.87 - 5.11 MIL/uL   Hemoglobin 10.5 (*) 12.0 - 15.0 g/dL   HCT 30.5 (*) 36.0 - 46.0 %   MCV 83.8  78.0 - 100.0 fL   MCH 28.8  26.0 - 34.0 pg   MCHC 34.4  30.0 - 36.0 g/dL   RDW 12.9  11.5 - 15.5 %   Platelets 308  150 - 400 K/uL  GLUCOSE, CAPILLARY     Status: Abnormal   Collection Time    12/21/13  7:48 AM  Result Value Ref Range   Glucose-Capillary 191 (*) 70 - 99 mg/dL  GLUCOSE, CAPILLARY     Status: Abnormal    Collection Time    12/21/13 11:42 AM      Result Value Ref Range   Glucose-Capillary 144 (*) 70 - 99 mg/dL    Signed: Marrion Coy, MD 12/21/2013, 1:32 PM   Time Spent on Discharge: 25 minutes Services Ordered on Discharge: None Equipment Ordered on Discharge: None

## 2013-12-22 NOTE — Discharge Summary (Signed)
INTERNAL MEDICINE ATTENDING DISCHARGE COSIGN   I evaluated the patient on the day of discharge and discussed the discharge plan with my resident team. I agree with the discharge documentation and disposition.   Lawarence Meek 12/22/2013, 3:12 PM

## 2014-01-04 ENCOUNTER — Ambulatory Visit: Payer: BC Managed Care – PPO | Admitting: Internal Medicine

## 2014-01-05 ENCOUNTER — Ambulatory Visit: Payer: BC Managed Care – PPO | Admitting: Internal Medicine

## 2014-03-06 ENCOUNTER — Encounter: Payer: Self-pay | Admitting: Internal Medicine

## 2014-08-26 ENCOUNTER — Emergency Department (HOSPITAL_COMMUNITY)
Admission: EM | Admit: 2014-08-26 | Discharge: 2014-08-26 | Disposition: A | Discharge status: Home / Self Care | Payer: BLUE CROSS/BLUE SHIELD | Attending: Emergency Medicine | Admitting: Emergency Medicine

## 2014-08-26 ENCOUNTER — Encounter (HOSPITAL_COMMUNITY): Payer: Self-pay | Admitting: *Deleted

## 2014-08-26 DIAGNOSIS — R197 Diarrhea, unspecified: Secondary | ICD-10-CM | POA: Diagnosis not present

## 2014-08-26 DIAGNOSIS — E119 Type 2 diabetes mellitus without complications: Secondary | ICD-10-CM | POA: Diagnosis not present

## 2014-08-26 DIAGNOSIS — R112 Nausea with vomiting, unspecified: Secondary | ICD-10-CM | POA: Diagnosis not present

## 2014-08-26 DIAGNOSIS — R630 Anorexia: Secondary | ICD-10-CM | POA: Diagnosis not present

## 2014-08-26 DIAGNOSIS — Z79899 Other long term (current) drug therapy: Secondary | ICD-10-CM | POA: Insufficient documentation

## 2014-08-26 DIAGNOSIS — Z720 Tobacco use: Secondary | ICD-10-CM | POA: Insufficient documentation

## 2014-08-26 DIAGNOSIS — Z3202 Encounter for pregnancy test, result negative: Secondary | ICD-10-CM | POA: Diagnosis not present

## 2014-08-26 DIAGNOSIS — F329 Major depressive disorder, single episode, unspecified: Secondary | ICD-10-CM | POA: Insufficient documentation

## 2014-08-26 DIAGNOSIS — R1013 Epigastric pain: Secondary | ICD-10-CM | POA: Diagnosis present

## 2014-08-26 DIAGNOSIS — Z9851 Tubal ligation status: Secondary | ICD-10-CM | POA: Diagnosis not present

## 2014-08-26 DIAGNOSIS — I10 Essential (primary) hypertension: Secondary | ICD-10-CM | POA: Diagnosis not present

## 2014-08-26 LAB — URINALYSIS, ROUTINE W REFLEX MICROSCOPIC
Bilirubin Urine: NEGATIVE
Glucose, UA: 250 mg/dL — AB
Ketones, ur: 15 mg/dL — AB
Leukocytes, UA: NEGATIVE
Nitrite: NEGATIVE
Protein, ur: >300 mg/dL — AB
Specific Gravity, Urine: 1.028 (ref 1.005–1.030)
Urobilinogen, UA: 0.2 mg/dL (ref 0.0–1.0)
pH: 6 (ref 5.0–8.0)

## 2014-08-26 LAB — COMPREHENSIVE METABOLIC PANEL
ALT: 18 U/L (ref 0–35)
AST: 27 U/L (ref 0–37)
Albumin: 3.3 g/dL — ABNORMAL LOW (ref 3.5–5.2)
Alkaline Phosphatase: 77 U/L (ref 39–117)
Anion gap: 15 (ref 5–15)
BUN: 15 mg/dL (ref 6–23)
CO2: 20 mmol/L (ref 19–32)
Calcium: 9 mg/dL (ref 8.4–10.5)
Chloride: 103 mEq/L (ref 96–112)
Creatinine, Ser: 0.95 mg/dL (ref 0.50–1.10)
GFR calc Af Amer: 88 mL/min — ABNORMAL LOW (ref >90)
GFR calc non Af Amer: 76 mL/min — ABNORMAL LOW (ref >90)
Glucose, Bld: 274 mg/dL — ABNORMAL HIGH (ref 70–99)
Potassium: 3.5 mmol/L (ref 3.5–5.1)
Sodium: 138 mmol/L (ref 135–145)
Total Bilirubin: 0.7 mg/dL (ref 0.3–1.2)
Total Protein: 7.8 g/dL (ref 6.0–8.3)

## 2014-08-26 LAB — CBG MONITORING, ED: Glucose-Capillary: 225 mg/dL — ABNORMAL HIGH (ref 70–99)

## 2014-08-26 LAB — CBC WITH DIFFERENTIAL/PLATELET
Basophils Absolute: 0 10*3/uL (ref 0.0–0.1)
Basophils Relative: 0 % (ref 0–1)
Eosinophils Absolute: 0 10*3/uL (ref 0.0–0.7)
Eosinophils Relative: 0 % (ref 0–5)
HCT: 34.7 % — ABNORMAL LOW (ref 36.0–46.0)
Hemoglobin: 12.2 g/dL (ref 12.0–15.0)
Lymphocytes Relative: 8 % — ABNORMAL LOW (ref 12–46)
Lymphs Abs: 0.9 10*3/uL (ref 0.7–4.0)
MCH: 28.7 pg (ref 26.0–34.0)
MCHC: 35.2 g/dL (ref 30.0–36.0)
MCV: 81.6 fL (ref 78.0–100.0)
Monocytes Absolute: 0.4 10*3/uL (ref 0.1–1.0)
Monocytes Relative: 4 % (ref 3–12)
Neutro Abs: 10.3 10*3/uL — ABNORMAL HIGH (ref 1.7–7.7)
Neutrophils Relative %: 88 % — ABNORMAL HIGH (ref 43–77)
Platelets: 364 10*3/uL (ref 150–400)
RBC: 4.25 MIL/uL (ref 3.87–5.11)
RDW: 13 % (ref 11.5–15.5)
WBC: 11.7 10*3/uL — ABNORMAL HIGH (ref 4.0–10.5)

## 2014-08-26 LAB — URINE MICROSCOPIC-ADD ON

## 2014-08-26 LAB — LIPASE, BLOOD: Lipase: 27 U/L (ref 11–59)

## 2014-08-26 LAB — PREGNANCY, URINE: Preg Test, Ur: NEGATIVE

## 2014-08-26 MED ORDER — HYDROMORPHONE HCL 1 MG/ML IJ SOLN
1.0000 mg | Freq: Once | INTRAMUSCULAR | Status: AC
  Administered 2014-08-26: 1 mg via INTRAVENOUS
  Filled 2014-08-26: qty 1

## 2014-08-26 MED ORDER — ONDANSETRON HCL 4 MG/2ML IJ SOLN
4.0000 mg | Freq: Once | INTRAMUSCULAR | Status: AC
  Administered 2014-08-26: 4 mg via INTRAVENOUS
  Filled 2014-08-26: qty 2

## 2014-08-26 MED ORDER — FAMOTIDINE IN NACL 20-0.9 MG/50ML-% IV SOLN
20.0000 mg | Freq: Once | INTRAVENOUS | Status: AC
  Administered 2014-08-26: 20 mg via INTRAVENOUS
  Filled 2014-08-26: qty 50

## 2014-08-26 MED ORDER — ONDANSETRON 4 MG PO TBDP
8.0000 mg | ORAL_TABLET | Freq: Once | ORAL | Status: AC
  Administered 2014-08-26: 8 mg via ORAL
  Filled 2014-08-26: qty 2

## 2014-08-26 MED ORDER — SODIUM CHLORIDE 0.9 % IV BOLUS (SEPSIS)
1000.0000 mL | Freq: Once | INTRAVENOUS | Status: AC
  Administered 2014-08-26: 1000 mL via INTRAVENOUS

## 2014-08-26 MED ORDER — ONDANSETRON HCL 4 MG PO TABS: 4.0000 mg | ORAL_TABLET | Freq: Four times a day (QID) | ORAL | Status: DC

## 2014-08-26 NOTE — ED Notes (Signed)
The pt is c/o severe abd pain since this am with n v and diarrhea.  She is a diabetic non-insulin dependent

## 2014-08-26 NOTE — ED Provider Notes (Signed)
CSN: [id number]     Arrival date & time 08/26/14  1931 History   First MD Initiated Contact with Patient 08/26/14 2119     Chief Complaint  Patient presents with  . Abdominal Pain     (Consider location/radiation/quality/duration/timing/severity/associated sxs/prior Treatment) Patient is a 39 y.o. female presenting with abdominal pain. The history is provided by the patient.  Abdominal Pain Pain location:  Epigastric Pain quality: aching, gnawing and sharp   Pain quality comment:  Abdominal pain started after multiple episodes of vomiting Pain radiates to:  Does not radiate Pain severity:  Moderate Onset quality:  Gradual Duration:  12 hours Timing:  Constant Progression:  Worsening Chronicity:  New Context comment:  Patient woke up this morning and started vomiting with diarrhea. After multiple episodes of vomiting she developed upper abdominal pain Relieved by:  Nothing Worsened by:  Nothing tried Ineffective treatments:  None tried Associated symptoms: anorexia, diarrhea, nausea and vomiting   Associated symptoms: no chest pain, no chills, no constipation, no cough, no dysuria, no fever and no shortness of breath   Risk factors: no alcohol abuse, has not had multiple surgeries, no NSAID use and no recent hospitalization   Risk factors comment:  Will drink one beer occasionally   Past Medical History  Diagnosis Date  . Depression   . Type II diabetes mellitus   . Hypertension    Past Surgical History  Procedure Laterality Date  . Tubal ligation  2002   No family history on file. History  Substance Use Topics  . Smoking status: Former Smoker -- 0.10 packs/day for 10 years    Types: Cigarettes    Start date: 11/17/2013  . Smokeless tobacco: Never Used  . Alcohol Use: Yes     Comment: 11/13/2013 "occasionally; once or twice/month"   OB History    No data available     Review of Systems  Constitutional: Negative for fever and chills.  Respiratory: Negative for  cough and shortness of breath.   Cardiovascular: Negative for chest pain.  Gastrointestinal: Positive for nausea, vomiting, abdominal pain, diarrhea and anorexia. Negative for constipation.  Genitourinary: Negative for dysuria.  All other systems reviewed and are negative.     Allergies  Review of patient's allergies indicates no known allergies.  Home Medications   Prior to Admission medications   Medication Sig Start Date End Date Taking? Authorizing Provider  buPROPion (WELLBUTRIN XL) 150 MG 24 hr tablet Take 1 tablet (150 mg total) by mouth daily. 11/24/13   Cresenciano Genre, MD  famotidine (PEPCID) 20 MG tablet Take 1 tablet (20 mg total) by mouth 2 (two) times daily. 12/21/13   Ricke Hey, MD  lisinopril (PRINIVIL,ZESTRIL) 20 MG tablet Take 1 tablet (20 mg total) by mouth daily. 11/16/13   Otho Bellows, MD  metFORMIN (GLUCOPHAGE) 500 MG tablet Take 2 tablets (1,000 mg total) by mouth 2 (two) times daily with a meal. 11/24/13 11/24/14  Cresenciano Genre, MD  metoCLOPramide (REGLAN) 5 MG tablet Take 1 tablet (5 mg total) by mouth 3 (three) times daily before meals. 12/21/13   Ricke Hey, MD  ondansetron (ZOFRAN ODT) 4 MG disintegrating tablet Take 1 tablet (4 mg total) by mouth every 8 (eight) hours as needed for nausea or vomiting. 12/21/13   Ricke Hey, MD  pantoprazole (PROTONIX) 40 MG tablet Take 1 tablet (40 mg total) by mouth daily. 12/21/13   Ricke Hey, MD  traZODone (DESYREL) 100 MG tablet Take 1  tablet (100 mg total) by mouth at bedtime. 11/24/13   Cresenciano Genre, MD   BP 170/94 mmHg  Pulse 98  Temp(Src) 97.3 F (36.3 C) (Oral)  Resp 24  Ht 5\' 5"  (1.651 m)  Wt 162 lb (73.483 kg)  BMI 26.96 kg/m2  SpO2 99%  LMP 08/24/2014 Physical Exam  Constitutional: She is oriented to person, place, and time. She appears well-developed and well-nourished. She appears distressed.  Appears to be in pain  HENT:  Head: Normocephalic and  atraumatic.  Eyes: EOM are normal. Pupils are equal, round, and reactive to light.  Cardiovascular: Normal rate, regular rhythm, normal heart sounds and intact distal pulses.  Exam reveals no friction rub.   No murmur heard. Pulmonary/Chest: Effort normal and breath sounds normal. She has no wheezes. She has no rales.  Abdominal: Soft. Bowel sounds are normal. She exhibits no distension. There is tenderness in the epigastric area. There is guarding. There is no rebound.  Musculoskeletal: Normal range of motion. She exhibits no tenderness.  No edema  Neurological: She is alert and oriented to person, place, and time. No cranial nerve deficit.  Skin: Skin is warm and dry. No rash noted.  Psychiatric: She has a normal mood and affect. Her behavior is normal.  Nursing note and vitals reviewed.   ED Course  Procedures (including critical care time) Labs Review Labs Reviewed  CBC WITH DIFFERENTIAL - Abnormal; Notable for the following:    WBC 11.7 (*)    HCT 34.7 (*)    Neutrophils Relative % 88 (*)    Neutro Abs 10.3 (*)    Lymphocytes Relative 8 (*)    All other components within normal limits  COMPREHENSIVE METABOLIC PANEL - Abnormal; Notable for the following:    Glucose, Bld 274 (*)    Albumin 3.3 (*)    GFR calc non Af Amer 76 (*)    GFR calc Af Amer 88 (*)    All other components within normal limits  URINALYSIS, ROUTINE W REFLEX MICROSCOPIC - Abnormal; Notable for the following:    Glucose, UA 250 (*)    Hgb urine dipstick LARGE (*)    Ketones, ur 15 (*)    Protein, ur >300 (*)    All other components within normal limits  URINE MICROSCOPIC-ADD ON - Abnormal; Notable for the following:    Squamous Epithelial / LPF FEW (*)    Bacteria, UA FEW (*)    Casts HYALINE CASTS (*)    All other components within normal limits  CBG MONITORING, ED - Abnormal; Notable for the following:    Glucose-Capillary 225 (*)    All other components within normal limits  LIPASE, BLOOD   PREGNANCY, URINE    Imaging Review No results found.   EKG Interpretation None      MDM   Final diagnoses:  Nausea vomiting and diarrhea    Pt with epigastric pain/vomitting/diarrhea.  Denies bad food exposure and recent travel out of the country.  No recent abx.  No hx concerning for GU pathology or kidney stones.  Pt is awake and alert on exam without peritoneal signs.  No heavy EtOH or NSAID use.  Pt does have DM and still taking meds.  CBC, CMP without acute abnormality except for hyperglycemia of 274. Lipase, UA and UPT are within normal limits. We'll treat patient with pain and nausea control as well as IV fluids.  10:52 PM Pt feeling much better will po challenge.  Blanchie Dessert, MD  08/26/14 2312 

## 2014-08-26 NOTE — ED Notes (Signed)
Pt called out asking for pain medication.

## 2014-08-26 NOTE — ED Notes (Signed)
Pt reports upper abdominal pain since today with n/v/d with scant blood in vomit.  Pt denies CP but does report SOB, lung sounds clear.

## 2014-08-26 NOTE — ED Notes (Signed)
The patient is unable to give an urine specimen at this time. The tech has reported to the RN in charge. 

## 2014-08-26 NOTE — ED Notes (Signed)
CBG results given to [facility] RN by B. Yolanda Bonine, EMT

## 2014-08-26 NOTE — ED Notes (Signed)
Maryan Rued, MD at bedside.

## 2014-09-28 ENCOUNTER — Encounter: Payer: Self-pay | Admitting: Internal Medicine

## 2014-10-10 ENCOUNTER — Encounter (HOSPITAL_COMMUNITY): Payer: Self-pay | Admitting: Emergency Medicine

## 2014-10-10 ENCOUNTER — Emergency Department (HOSPITAL_COMMUNITY)
Admission: EM | Admit: 2014-10-10 | Discharge: 2014-10-11 | Disposition: A | Discharge status: Home / Self Care | Payer: BLUE CROSS/BLUE SHIELD | Attending: Emergency Medicine | Admitting: Emergency Medicine

## 2014-10-10 DIAGNOSIS — Z79899 Other long term (current) drug therapy: Secondary | ICD-10-CM | POA: Diagnosis not present

## 2014-10-10 DIAGNOSIS — R1013 Epigastric pain: Secondary | ICD-10-CM | POA: Diagnosis not present

## 2014-10-10 DIAGNOSIS — R112 Nausea with vomiting, unspecified: Secondary | ICD-10-CM

## 2014-10-10 DIAGNOSIS — F329 Major depressive disorder, single episode, unspecified: Secondary | ICD-10-CM | POA: Diagnosis not present

## 2014-10-10 DIAGNOSIS — Z3202 Encounter for pregnancy test, result negative: Secondary | ICD-10-CM | POA: Diagnosis not present

## 2014-10-10 DIAGNOSIS — Z87891 Personal history of nicotine dependence: Secondary | ICD-10-CM | POA: Insufficient documentation

## 2014-10-10 DIAGNOSIS — R739 Hyperglycemia, unspecified: Secondary | ICD-10-CM

## 2014-10-10 DIAGNOSIS — I10 Essential (primary) hypertension: Secondary | ICD-10-CM | POA: Diagnosis not present

## 2014-10-10 DIAGNOSIS — R1084 Generalized abdominal pain: Secondary | ICD-10-CM | POA: Insufficient documentation

## 2014-10-10 DIAGNOSIS — Z9851 Tubal ligation status: Secondary | ICD-10-CM | POA: Insufficient documentation

## 2014-10-10 DIAGNOSIS — E1165 Type 2 diabetes mellitus with hyperglycemia: Secondary | ICD-10-CM | POA: Diagnosis not present

## 2014-10-10 LAB — BASIC METABOLIC PANEL
Anion gap: 10 (ref 5–15)
BUN: 13 mg/dL (ref 6–23)
CO2: 26 mmol/L (ref 19–32)
Calcium: 9.3 mg/dL (ref 8.4–10.5)
Chloride: 102 mmol/L (ref 96–112)
Creatinine, Ser: 1.08 mg/dL (ref 0.50–1.10)
GFR calc Af Amer: 75 mL/min — ABNORMAL LOW (ref >90)
GFR calc non Af Amer: 65 mL/min — ABNORMAL LOW (ref >90)
Glucose, Bld: 359 mg/dL — ABNORMAL HIGH (ref 70–99)
Potassium: 3.7 mmol/L (ref 3.5–5.1)
Sodium: 138 mmol/L (ref 135–145)

## 2014-10-10 LAB — CBC WITH DIFFERENTIAL/PLATELET
Basophils Absolute: 0 10*3/uL (ref 0.0–0.1)
Basophils Relative: 0 % (ref 0–1)
Eosinophils Absolute: 0 10*3/uL (ref 0.0–0.7)
Eosinophils Relative: 0 % (ref 0–5)
HCT: 34.5 % — ABNORMAL LOW (ref 36.0–46.0)
Hemoglobin: 11.8 g/dL — ABNORMAL LOW (ref 12.0–15.0)
Lymphocytes Relative: 5 % — ABNORMAL LOW (ref 12–46)
Lymphs Abs: 0.5 10*3/uL — ABNORMAL LOW (ref 0.7–4.0)
MCH: 28.9 pg (ref 26.0–34.0)
MCHC: 34.2 g/dL (ref 30.0–36.0)
MCV: 84.4 fL (ref 78.0–100.0)
Monocytes Absolute: 0.3 10*3/uL (ref 0.1–1.0)
Monocytes Relative: 3 % (ref 3–12)
Neutro Abs: 8.9 10*3/uL — ABNORMAL HIGH (ref 1.7–7.7)
Neutrophils Relative %: 92 % — ABNORMAL HIGH (ref 43–77)
Platelets: 342 10*3/uL (ref 150–400)
RBC: 4.09 MIL/uL (ref 3.87–5.11)
RDW: 13.6 % (ref 11.5–15.5)
WBC: 9.7 10*3/uL (ref 4.0–10.5)

## 2014-10-10 LAB — URINE MICROSCOPIC-ADD ON

## 2014-10-10 LAB — CBG MONITORING, ED: Glucose-Capillary: 326 mg/dL — ABNORMAL HIGH (ref 70–99)

## 2014-10-10 LAB — URINALYSIS, ROUTINE W REFLEX MICROSCOPIC
Bilirubin Urine: NEGATIVE
Glucose, UA: >1000 mg/dL — AB
Ketones, ur: 15 mg/dL — AB
Leukocytes, UA: NEGATIVE
Nitrite: NEGATIVE
Protein, ur: >300 mg/dL — AB
Specific Gravity, Urine: 1.024 (ref 1.005–1.030)
Urobilinogen, UA: 0.2 mg/dL (ref 0.0–1.0)
pH: 7 (ref 5.0–8.0)

## 2014-10-10 LAB — POC URINE PREG, ED: Preg Test, Ur: NEGATIVE

## 2014-10-10 MED ORDER — PROMETHAZINE HCL 25 MG PO TABS: 25.0000 mg | ORAL_TABLET | Freq: Four times a day (QID) | ORAL | Status: DC | PRN

## 2014-10-10 MED ORDER — DIPHENHYDRAMINE HCL 50 MG/ML IJ SOLN
25.0000 mg | Freq: Once | INTRAMUSCULAR | Status: AC
  Administered 2014-10-10: 25 mg via INTRAVENOUS
  Filled 2014-10-10: qty 1

## 2014-10-10 MED ORDER — FAMOTIDINE 20 MG PO TABS: 20.0000 mg | ORAL_TABLET | Freq: Two times a day (BID) | ORAL | Status: DC

## 2014-10-10 MED ORDER — HYDROMORPHONE HCL 1 MG/ML IJ SOLN
1.0000 mg | Freq: Once | INTRAMUSCULAR | Status: AC
  Administered 2014-10-10: 1 mg via INTRAVENOUS
  Filled 2014-10-10: qty 1

## 2014-10-10 MED ORDER — OXYCODONE-ACETAMINOPHEN 5-325 MG PO TABS: 1.0000 | ORAL_TABLET | ORAL | Status: DC | PRN

## 2014-10-10 MED ORDER — SODIUM CHLORIDE 0.9 % IV BOLUS (SEPSIS)
1000.0000 mL | Freq: Once | INTRAVENOUS | Status: AC
  Administered 2014-10-10: 1000 mL via INTRAVENOUS

## 2014-10-10 MED ORDER — METOCLOPRAMIDE HCL 5 MG/ML IJ SOLN
10.0000 mg | Freq: Once | INTRAMUSCULAR | Status: AC
  Administered 2014-10-10: 10 mg via INTRAVENOUS
  Filled 2014-10-10: qty 2

## 2014-10-10 NOTE — ED Notes (Signed)
PO challenge per provider. Given water, crackers, and peanut butter.

## 2014-10-10 NOTE — ED Provider Notes (Signed)
CSN: [id number]     Arrival date & time 10/10/14  1825 History   First MD Initiated Contact with Patient 10/10/14 1953     Chief Complaint  Patient presents with  . Emesis     (Consider location/radiation/quality/duration/timing/severity/associated sxs/prior Treatment) HPI Kathy Frank is a(n) 39 y.o. female who presents to the ed WITH 1 DAY of epigastric pain., nausea and vomiting.  HX previous gastroparesis. No contacts with similar. Denies fevers, chills, myalgias, arthralgias. Denies DOE, SOB, chest tightness or pressure, radiation to left arm, jaw or back, or diaphoresis. Denies dysuria, flank pain, suprapubic pain, frequency, urgency, or hematuria. Denies headaches, light headedness, weakness, visual disturbances. Denies diarrhea or constipation, hematemesis or bilious vomitus.    Past Medical History  Diagnosis Date  . Depression   . Type II diabetes mellitus   . Hypertension    Past Surgical History  Procedure Laterality Date  . Tubal ligation  2002   No family history on file. History  Substance Use Topics  . Smoking status: Former Smoker -- 0.10 packs/day for 10 years    Types: Cigarettes    Start date: 11/17/2013  . Smokeless tobacco: Never Used  . Alcohol Use: Yes     Comment: 11/13/2013 "occasionally; once or twice/month"   OB History    No data available     Review of Systems  Ten systems reviewed and are negative for acute change, except as noted in the HPI.    Allergies  Review of patient's allergies indicates no known allergies.  Home Medications   Prior to Admission medications   Medication Sig Start Date End Date Taking? Authorizing Provider  buPROPion (WELLBUTRIN XL) 150 MG 24 hr tablet Take 1 tablet (150 mg total) by mouth daily. 11/24/13  Yes Cresenciano Genre, MD  lisinopril (PRINIVIL,ZESTRIL) 20 MG tablet Take 1 tablet (20 mg total) by mouth daily. 11/16/13  Yes Otho Bellows, MD  metFORMIN (GLUCOPHAGE) 500 MG tablet Take 2 tablets (1,000 mg  total) by mouth 2 (two) times daily with a meal. 11/24/13 11/24/14 Yes Cresenciano Genre, MD  metoCLOPramide (REGLAN) 5 MG tablet Take 1 tablet (5 mg total) by mouth 3 (three) times daily before meals. 12/21/13  Yes Ricke Hey, MD  ondansetron (ZOFRAN ODT) 4 MG disintegrating tablet Take 1 tablet (4 mg total) by mouth every 8 (eight) hours as needed for nausea or vomiting. 12/21/13  Yes Ricke Hey, MD  pantoprazole (PROTONIX) 40 MG tablet Take 1 tablet (40 mg total) by mouth daily. 12/21/13  Yes Ricke Hey, MD  traZODone (DESYREL) 100 MG tablet Take 1 tablet (100 mg total) by mouth at bedtime. 11/24/13  Yes Cresenciano Genre, MD  famotidine (PEPCID) 20 MG tablet Take 1 tablet (20 mg total) by mouth 2 (two) times daily. 10/10/14   Margarita Mail, PA-C  ondansetron (ZOFRAN) 4 MG tablet Take 1 tablet (4 mg total) by mouth every 6 (six) hours. Patient not taking: Reported on 10/10/2014 08/26/14   Blanchie Dessert, MD  oxyCODONE-acetaminophen (PERCOCET) 5-325 MG per tablet Take 1-2 tablets by mouth every 4 (four) hours as needed. 10/10/14   Margarita Mail, PA-C  promethazine (PHENERGAN) 25 MG tablet Take 1 tablet (25 mg total) by mouth every 6 (six) hours as needed for nausea or vomiting. 10/10/14   Mukhtar Shams, PA-C   BP 187/92 mmHg  Pulse 93  Temp(Src) 98.1 F (36.7 C) (Oral)  Resp 13  Ht 5\' 5"  (1.651 m)  Wt 170 lb (  77.111 kg)  BMI 28.29 kg/m2  SpO2 100%  LMP 09/13/2014 Physical Exam  Constitutional: She is oriented to person, place, and time. She appears well-developed and well-nourished. No distress.  HENT:  Head: Normocephalic and atraumatic.  Eyes: Conjunctivae are normal. No scleral icterus.  Neck: Normal range of motion.  Cardiovascular: Normal rate, regular rhythm and normal heart sounds.  Exam reveals no gallop and no friction rub.   No murmur heard. Pulmonary/Chest: Effort normal and breath sounds normal. No respiratory distress.  Abdominal: Soft. Bowel sounds  are normal. She exhibits no distension and no mass. There is tenderness (diffuse). There is no guarding.  Neurological: She is alert and oriented to person, place, and time.  Skin: Skin is warm and dry. She is not diaphoretic.    ED Course  Procedures (including critical care time) Labs Review Labs Reviewed  CBC WITH DIFFERENTIAL/PLATELET - Abnormal; Notable for the following:    Hemoglobin 11.8 (*)    HCT 34.5 (*)    Neutrophils Relative % 92 (*)    Neutro Abs 8.9 (*)    Lymphocytes Relative 5 (*)    Lymphs Abs 0.5 (*)    All other components within normal limits  BASIC METABOLIC PANEL - Abnormal; Notable for the following:    Glucose, Bld 359 (*)    GFR calc non Af Amer 65 (*)    GFR calc Af Amer 75 (*)    All other components within normal limits  URINALYSIS, ROUTINE W REFLEX MICROSCOPIC - Abnormal; Notable for the following:    Glucose, UA >1000 (*)    Hgb urine dipstick LARGE (*)    Ketones, ur 15 (*)    Protein, ur >300 (*)    All other components within normal limits  URINE MICROSCOPIC-ADD ON - Abnormal; Notable for the following:    Squamous Epithelial / LPF FEW (*)    Casts HYALINE CASTS (*)    All other components within normal limits  CBG MONITORING, ED - Abnormal; Notable for the following:    Glucose-Capillary 326 (*)    All other components within normal limits  POC URINE PREG, ED    Imaging Review No results found.   EKG Interpretation None      MDM   Final diagnoses:  Epigastric pain  Non-intractable vomiting with nausea, vomiting of unspecified type  Hyperglycemia    Patient with epigastric pain. Poorly controlled diabetes . He urine appears contaminated. He labs are otherwise at baseline and do not indicate acute intrabdominal pathology. Patient is nontoxic, nonseptic appearing, in no apparent distress.  Patient's pain and other symptoms adequately managed in emergency department.  Fluid bolus given.  Labs, imaging and vitals reviewed.  Patient  does not meet the SIRS or Sepsis criteria.  On repeat exam patient does not have a surgical abdomin and there are no peritoneal signs.  No indication of appendicitis, bowel obstruction, bowel perforation, cholecystitis, diverticulitis, PID or ectopic pregnancy.  Patient discharged home with symptomatic treatment and given strict instructions for follow-up with their primary care physician.  I have also discussed reasons to return immediately to the ER.  Patient expresses understanding and agrees with plan.       Margarita Mail, PA-C 10/11/14 [place] Alvino Chapel, MD 10/11/14 [phone]

## 2014-10-10 NOTE — Discharge Instructions (Signed)
Abdominal (belly) pain can be caused by many things. Your caregiver performed an examination and possibly ordered blood/urine tests and imaging (CT scan, x-rays, ultrasound). Many cases can be observed and treated at home after initial evaluation in the emergency department. Even though you are being discharged home, abdominal pain can be unpredictable. Therefore, you need a repeated exam if your pain does not resolve, returns, or worsens. Most patients with abdominal pain don't have to be admitted to the hospital or have surgery, but serious problems like appendicitis and gallbladder attacks can start out as nonspecific pain. Many abdominal conditions cannot be diagnosed in one visit, so follow-up evaluations are very important. °SEEK IMMEDIATE MEDICAL ATTENTION IF: °The pain does not go away or becomes severe.  °A temperature above 101 develops.  °Repeated vomiting occurs (multiple episodes).  °The pain becomes localized to portions of the abdomen. The right side could possibly be appendicitis. In an adult, the left lower portion of the abdomen could be colitis or diverticulitis.  °Blood is being passed in stools or vomit (bright red or black tarry stools).  °Return also if you develop chest pain, difficulty breathing, dizziness or fainting, or become confused, poorly responsive, or inconsolable (young children). ° ° °Abdominal Pain °Many things can cause abdominal pain. Usually, abdominal pain is not caused by a disease and will improve without treatment. It can often be observed and treated at home. Your health care provider will do a physical exam and possibly order blood tests and X-rays to help determine the seriousness of your pain. However, in many cases, more time must pass before a clear cause of the pain can be found. Before that point, your health care provider may not know if you need more testing or further treatment. °HOME CARE INSTRUCTIONS  °Monitor your abdominal pain for any changes. The following  actions may help to alleviate any discomfort you are experiencing: °· Only take over-the-counter or prescription medicines as directed by your health care provider. °· Do not take laxatives unless directed to do so by your health care provider. °· Try a clear liquid diet (broth, tea, or water) as directed by your health care provider. Slowly move to a bland diet as tolerated. °SEEK MEDICAL CARE IF: °· You have unexplained abdominal pain. °· You have abdominal pain associated with nausea or diarrhea. °· You have pain when you urinate or have a bowel movement. °· You experience abdominal pain that wakes you in the night. °· You have abdominal pain that is worsened or improved by eating food. °· You have abdominal pain that is worsened with eating fatty foods. °· You have a fever. °SEEK IMMEDIATE MEDICAL CARE IF:  °· Your pain does not go away within 2 hours. °· You keep throwing up (vomiting). °· Your pain is felt only in portions of the abdomen, such as the right side or the left lower portion of the abdomen. °· You pass bloody or black tarry stools. °MAKE SURE YOU: °· Understand these instructions.   °· Will watch your condition.   °· Will get help right away if you are not doing well or get worse.   °Document Released: 05/06/2005 Document Revised: 08/01/2013 Document Reviewed: 04/05/2013 °[employer]® Patient Information ©2015 [employer]. This information is not intended to replace advice given to you by your health care provider. Make sure you discuss any questions you have with your health care provider. ° °

## 2014-10-10 NOTE — ED Notes (Signed)
Pt reports pain in epigastric region with nausea and vomiting for the past day.  Pt denies diarrhea.  Pt alert and oriented.

## 2014-10-10 NOTE — ED Notes (Signed)
Pt a/o x 4 on d/c with syeady gait. Pt refused wheelchair.

## 2014-10-10 NOTE — ED Notes (Signed)
[id number] 

## 2014-10-10 NOTE — ED Notes (Signed)
Pt tolerated PO water and crackers with no difficulties.

## 2014-10-10 NOTE — ED Notes (Addendum)
Pt c/o upper abd pain with nausea and vomiting.  Onset this am.  Unable to keep anything down.  Pt has hx of HTN but did not take her meds today

## 2014-10-30 ENCOUNTER — Inpatient Hospital Stay (HOSPITAL_COMMUNITY)
Admission: EM | Admit: 2014-10-30 | Discharge: 2014-11-03 | DRG: 074 | Disposition: A | Discharge status: Home / Self Care | Payer: BLUE CROSS/BLUE SHIELD | Attending: Internal Medicine | Admitting: Internal Medicine

## 2014-10-30 ENCOUNTER — Encounter (HOSPITAL_COMMUNITY): Payer: Self-pay | Admitting: Emergency Medicine

## 2014-10-30 DIAGNOSIS — F1210 Cannabis abuse, uncomplicated: Secondary | ICD-10-CM | POA: Diagnosis present

## 2014-10-30 DIAGNOSIS — E119 Type 2 diabetes mellitus without complications: Secondary | ICD-10-CM | POA: Diagnosis present

## 2014-10-30 DIAGNOSIS — K3184 Gastroparesis: Secondary | ICD-10-CM | POA: Diagnosis present

## 2014-10-30 DIAGNOSIS — R312 Other microscopic hematuria: Secondary | ICD-10-CM | POA: Diagnosis present

## 2014-10-30 DIAGNOSIS — I10 Essential (primary) hypertension: Secondary | ICD-10-CM | POA: Diagnosis present

## 2014-10-30 DIAGNOSIS — E1165 Type 2 diabetes mellitus with hyperglycemia: Secondary | ICD-10-CM | POA: Diagnosis present

## 2014-10-30 DIAGNOSIS — R3129 Other microscopic hematuria: Secondary | ICD-10-CM | POA: Diagnosis present

## 2014-10-30 DIAGNOSIS — E1169 Type 2 diabetes mellitus with other specified complication: Secondary | ICD-10-CM | POA: Diagnosis present

## 2014-10-30 DIAGNOSIS — F329 Major depressive disorder, single episode, unspecified: Secondary | ICD-10-CM | POA: Diagnosis present

## 2014-10-30 DIAGNOSIS — R109 Unspecified abdominal pain: Secondary | ICD-10-CM | POA: Diagnosis not present

## 2014-10-30 DIAGNOSIS — G43A1 Cyclical vomiting, intractable: Secondary | ICD-10-CM | POA: Diagnosis present

## 2014-10-30 DIAGNOSIS — E876 Hypokalemia: Secondary | ICD-10-CM | POA: Diagnosis not present

## 2014-10-30 DIAGNOSIS — K219 Gastro-esophageal reflux disease without esophagitis: Secondary | ICD-10-CM | POA: Diagnosis present

## 2014-10-30 DIAGNOSIS — E1143 Type 2 diabetes mellitus with diabetic autonomic (poly)neuropathy: Secondary | ICD-10-CM | POA: Diagnosis not present

## 2014-10-30 DIAGNOSIS — Z79899 Other long term (current) drug therapy: Secondary | ICD-10-CM

## 2014-10-30 DIAGNOSIS — Z87891 Personal history of nicotine dependence: Secondary | ICD-10-CM

## 2014-10-30 DIAGNOSIS — N179 Acute kidney failure, unspecified: Secondary | ICD-10-CM | POA: Diagnosis present

## 2014-10-30 DIAGNOSIS — E669 Obesity, unspecified: Secondary | ICD-10-CM | POA: Diagnosis present

## 2014-10-30 HISTORY — DX: Gastroparesis: K31.84

## 2014-10-30 HISTORY — DX: Cyclical vomiting syndrome unrelated to migraine: R11.15

## 2014-10-30 LAB — CBC WITH DIFFERENTIAL/PLATELET
Basophils Absolute: 0 10*3/uL (ref 0.0–0.1)
Basophils Relative: 0 % (ref 0–1)
Eosinophils Absolute: 0 10*3/uL (ref 0.0–0.7)
Eosinophils Relative: 0 % (ref 0–5)
HCT: 36.4 % (ref 36.0–46.0)
Hemoglobin: 12.8 g/dL (ref 12.0–15.0)
Lymphocytes Relative: 6 % — ABNORMAL LOW (ref 12–46)
Lymphs Abs: 0.6 10*3/uL — ABNORMAL LOW (ref 0.7–4.0)
MCH: 29.4 pg (ref 26.0–34.0)
MCHC: 35.2 g/dL (ref 30.0–36.0)
MCV: 83.7 fL (ref 78.0–100.0)
Monocytes Absolute: 0.2 10*3/uL (ref 0.1–1.0)
Monocytes Relative: 2 % — ABNORMAL LOW (ref 3–12)
Neutro Abs: 9.6 10*3/uL — ABNORMAL HIGH (ref 1.7–7.7)
Neutrophils Relative %: 92 % — ABNORMAL HIGH (ref 43–77)
Platelets: 361 10*3/uL (ref 150–400)
RBC: 4.35 MIL/uL (ref 3.87–5.11)
RDW: 13.2 % (ref 11.5–15.5)
WBC: 10.5 10*3/uL (ref 4.0–10.5)

## 2014-10-30 LAB — COMPREHENSIVE METABOLIC PANEL
ALT: 13 U/L (ref 0–35)
AST: 21 U/L (ref 0–37)
Albumin: 3 g/dL — ABNORMAL LOW (ref 3.5–5.2)
Alkaline Phosphatase: 87 U/L (ref 39–117)
Anion gap: 12 (ref 5–15)
BUN: 13 mg/dL (ref 6–23)
CO2: 22 mmol/L (ref 19–32)
Calcium: 9.1 mg/dL (ref 8.4–10.5)
Chloride: 101 mmol/L (ref 96–112)
Creatinine, Ser: 1.19 mg/dL — ABNORMAL HIGH (ref 0.50–1.10)
GFR calc Af Amer: 66 mL/min — ABNORMAL LOW (ref >90)
GFR calc non Af Amer: 57 mL/min — ABNORMAL LOW (ref >90)
Glucose, Bld: 325 mg/dL — ABNORMAL HIGH (ref 70–99)
Potassium: 3.7 mmol/L (ref 3.5–5.1)
Sodium: 135 mmol/L (ref 135–145)
Total Bilirubin: 0.8 mg/dL (ref 0.3–1.2)
Total Protein: 7.5 g/dL (ref 6.0–8.3)

## 2014-10-30 LAB — POC URINE PREG, ED: Preg Test, Ur: NEGATIVE

## 2014-10-30 LAB — LIPASE, BLOOD: Lipase: 31 U/L (ref 11–59)

## 2014-10-30 MED ORDER — SODIUM CHLORIDE 0.9 % IV BOLUS (SEPSIS)
1000.0000 mL | Freq: Once | INTRAVENOUS | Status: AC
  Administered 2014-10-31: 1000 mL via INTRAVENOUS

## 2014-10-30 MED ORDER — FAMOTIDINE IN NACL 20-0.9 MG/50ML-% IV SOLN
20.0000 mg | Freq: Once | INTRAVENOUS | Status: AC
  Administered 2014-10-31: 20 mg via INTRAVENOUS
  Filled 2014-10-30: qty 50

## 2014-10-30 MED ORDER — PROMETHAZINE HCL 25 MG/ML IJ SOLN
12.5000 mg | Freq: Once | INTRAMUSCULAR | Status: AC
  Administered 2014-10-31: 12.5 mg via INTRAVENOUS
  Filled 2014-10-30: qty 1

## 2014-10-30 MED ORDER — ONDANSETRON 4 MG PO TBDP
ORAL_TABLET | ORAL | Status: AC
  Filled 2014-10-30: qty 2

## 2014-10-30 MED ORDER — FENTANYL CITRATE 0.05 MG/ML IJ SOLN
50.0000 ug | INTRAMUSCULAR | Status: AC | PRN
  Administered 2014-10-31 (×2): 50 ug via INTRAVENOUS
  Filled 2014-10-30 (×2): qty 2

## 2014-10-30 MED ORDER — ONDANSETRON 4 MG PO TBDP
8.0000 mg | ORAL_TABLET | Freq: Once | ORAL | Status: AC
  Administered 2014-10-30: 8 mg via ORAL

## 2014-10-30 MED ORDER — ONDANSETRON HCL 4 MG/2ML IJ SOLN
4.0000 mg | Freq: Once | INTRAMUSCULAR | Status: AC
  Administered 2014-10-31: 4 mg via INTRAVENOUS
  Filled 2014-10-30: qty 2

## 2014-10-30 NOTE — ED Notes (Signed)
IV insertion attempted without success x2.

## 2014-10-30 NOTE — ED Notes (Signed)
1 attempt at IV start unsuccessful.

## 2014-10-30 NOTE — ED Notes (Signed)
Patient here with complaint of abdominal pain starting today. States she had similar pain last year around this time. Was admitted per patient and stayed in hospital for about a week with cause not determined. States that pain wasn't as bad then and she was seeing blood in vomit.

## 2014-10-30 NOTE — ED Provider Notes (Signed)
CSN: [id number]     Arrival date & time 10/30/14  2131 History   First MD Initiated Contact with Patient 10/30/14 2231     Chief Complaint  Patient presents with  . Abdominal Pain  . Hematemesis     HPI Pt was seen at 2240. Per pt, c/o gradual onset and persistence of multiple intermittent episodes of N/V that began earlier today. Has been associated with generalized abd "pain." Describes her symptoms as an acute flair of her chronic symptoms for the past several years. Pt has been admitted previously for these symptoms, dx gastroparesis as well as THC induced vomiting. Pt states she continues to smoke marijuana. Pt states she has been seeing "blood flecks" in her emesis today.  Denies diarrhea, no CP/SOB, no back pain, no fevers, no black or blood in stools, no black emesis.    PMD: [facility] Past Medical History  Diagnosis Date  . Depression   . Type II diabetes mellitus   . Hypertension   . Gastroparesis   . Cyclical vomiting     THC induced   Past Surgical History  Procedure Laterality Date  . Tubal ligation  2002    History  Substance Use Topics  . Smoking status: Former Smoker -- 0.10 packs/day for 10 years    Types: Cigarettes    Start date: 11/17/2013  . Smokeless tobacco: Never Used  . Alcohol Use: Yes     Comment: 11/13/2013 "occasionally; once or twice/month"    Review of Systems ROS: Statement: All systems negative except as marked or noted in the HPI; Constitutional: Negative for fever and chills. ; ; Eyes: Negative for eye pain, redness and discharge. ; ; ENMT: Negative for ear pain, hoarseness, nasal congestion, sinus pressure and sore throat. ; ; Cardiovascular: Negative for chest pain, palpitations, diaphoresis, dyspnea and peripheral edema. ; ; Respiratory: Negative for cough, wheezing and stridor. ; ; Gastrointestinal: +N/V, "blood flecks" in emesis, abd pain. Negative for diarrhea, blood in stool, jaundice and rectal bleeding. . ; ; Genitourinary: Negative for  dysuria, flank pain and hematuria. ; ; Musculoskeletal: Negative for back pain and neck pain. Negative for swelling and trauma.; ; Skin: Negative for pruritus, rash, abrasions, blisters, bruising and skin lesion.; ; Neuro: Negative for headache, lightheadedness and neck stiffness. Negative for weakness, altered level of consciousness , altered mental status, extremity weakness, paresthesias, involuntary movement, seizure and syncope.      Allergies  Review of patient's allergies indicates no known allergies.  Home Medications   Prior to Admission medications   Medication Sig Start Date End Date Taking? Authorizing Provider  buPROPion (WELLBUTRIN XL) 150 MG 24 hr tablet Take 1 tablet (150 mg total) by mouth daily. 11/24/13  Yes Cresenciano Genre, MD  famotidine (PEPCID) 20 MG tablet Take 1 tablet (20 mg total) by mouth 2 (two) times daily. 10/10/14  Yes Margarita Mail, PA-C  lisinopril (PRINIVIL,ZESTRIL) 20 MG tablet Take 1 tablet (20 mg total) by mouth daily. 11/16/13  Yes Otho Bellows, MD  metFORMIN (GLUCOPHAGE) 500 MG tablet Take 2 tablets (1,000 mg total) by mouth 2 (two) times daily with a meal. 11/24/13 11/24/14 Yes Cresenciano Genre, MD  oxyCODONE-acetaminophen (PERCOCET) 5-325 MG per tablet Take 1-2 tablets by mouth every 4 (four) hours as needed. 10/10/14  Yes Margarita Mail, PA-C  promethazine (PHENERGAN) 25 MG tablet Take 1 tablet (25 mg total) by mouth every 6 (six) hours as needed for nausea or vomiting. 10/10/14  Yes Margarita Mail, PA-C  traZODone (DESYREL) 100 MG tablet Take 1 tablet (100 mg total) by mouth at bedtime. 11/24/13  Yes Cresenciano Genre, MD  metoCLOPramide (REGLAN) 5 MG tablet Take 1 tablet (5 mg total) by mouth 3 (three) times daily before meals. Patient not taking: Reported on 10/30/2014 12/21/13   Ricke Hey, MD  ondansetron (ZOFRAN ODT) 4 MG disintegrating tablet Take 1 tablet (4 mg total) by mouth every 8 (eight) hours as needed for nausea or vomiting. Patient not  taking: Reported on 10/30/2014 12/21/13   Ricke Hey, MD  ondansetron (ZOFRAN) 4 MG tablet Take 1 tablet (4 mg total) by mouth every 6 (six) hours. Patient not taking: Reported on 10/10/2014 08/26/14   Blanchie Dessert, MD  pantoprazole (PROTONIX) 40 MG tablet Take 1 tablet (40 mg total) by mouth daily. Patient not taking: Reported on 10/30/2014 12/21/13   Ricke Hey, MD   BP 186/106 mmHg  Pulse 105  Temp(Src) 97.9 F (36.6 C) (Oral)  Resp 20  Ht 5\' 5"  (1.651 m)  Wt 170 lb (77.111 kg)  BMI 28.29 kg/m2  SpO2 99%  LMP 10/12/2014 (Exact Date) Physical Exam  2245: Physical examination:  Nursing notes reviewed; Vital signs and O2 SAT reviewed;  Constitutional: Well developed, Well nourished, Well hydrated, In no acute distress; Head:  Normocephalic, atraumatic; Eyes: EOMI, PERRL, No scleral icterus; ENMT: Mouth and pharynx normal, Mucous membranes moist; Neck: Supple, Full range of motion, No lymphadenopathy; Cardiovascular: Regular rate and rhythm, No murmur, rub, or gallop; Respiratory: Breath sounds clear & equal bilaterally, No rales, rhonchi, wheezes.  Speaking full sentences with ease, Normal respiratory effort/excursion; Chest: Nontender, Movement normal; Abdomen: Soft, +diffuse tenderness to palp. No rebound or guarding. Nondistended, Normal bowel sounds. Rectal exam performed w/permission of pt and ED RN chaperone present.  Anal tone normal.  Non-tender, soft brown stool in rectal vault, heme neg.  No fissures, no external hemorrhoids, no palp masses. +holding emesis bag with flecks of red noted in brown-clear emesis. No coffee ground emesis.; Genitourinary: No CVA tenderness; Extremities: Pulses normal, No tenderness, No edema, No calf edema or asymmetry.; Neuro: AA&Ox3, Major CN grossly intact.  Speech clear. No gross focal motor or sensory deficits in extremities.; Skin: Color normal, Warm, Dry.   ED Course  Procedures     EKG Interpretation None      MDM   MDM Reviewed: previous chart, nursing note and vitals Reviewed previous: labs Interpretation: labs and x-ray     Results for orders placed or performed during the hospital encounter of 10/30/14  CBC with Differential  Result Value Ref Range   WBC 10.5 4.0 - 10.5 K/uL   RBC 4.35 3.87 - 5.11 MIL/uL   Hemoglobin 12.8 12.0 - 15.0 g/dL   HCT 36.4 36.0 - 46.0 %   MCV 83.7 78.0 - 100.0 fL   MCH 29.4 26.0 - 34.0 pg   MCHC 35.2 30.0 - 36.0 g/dL   RDW 13.2 11.5 - 15.5 %   Platelets 361 150 - 400 K/uL   Neutrophils Relative % 92 (H) 43 - 77 %   Neutro Abs 9.6 (H) 1.7 - 7.7 K/uL   Lymphocytes Relative 6 (L) 12 - 46 %   Lymphs Abs 0.6 (L) 0.7 - 4.0 K/uL   Monocytes Relative 2 (L) 3 - 12 %   Monocytes Absolute 0.2 0.1 - 1.0 K/uL   Eosinophils Relative 0 0 - 5 %   Eosinophils Absolute 0.0 0.0 - 0.7 K/uL   Basophils Relative  0 0 - 1 %   Basophils Absolute 0.0 0.0 - 0.1 K/uL  Comprehensive metabolic panel  Result Value Ref Range   Sodium 135 135 - 145 mmol/L   Potassium 3.7 3.5 - 5.1 mmol/L   Chloride 101 96 - 112 mmol/L   CO2 22 19 - 32 mmol/L   Glucose, Bld 325 (H) 70 - 99 mg/dL   BUN 13 6 - 23 mg/dL   Creatinine, Ser 1.19 (H) 0.50 - 1.10 mg/dL   Calcium 9.1 8.4 - 10.5 mg/dL   Total Protein 7.5 6.0 - 8.3 g/dL   Albumin 3.0 (L) 3.5 - 5.2 g/dL   AST 21 0 - 37 U/L   ALT 13 0 - 35 U/L   Alkaline Phosphatase 87 39 - 117 U/L   Total Bilirubin 0.8 0.3 - 1.2 mg/dL   GFR calc non Af Amer 57 (L) >90 mL/min   GFR calc Af Amer 66 (L) >90 mL/min   Anion gap 12 5 - 15  Lipase, blood  Result Value Ref Range   Lipase 31 11 - 59 U/L  Urinalysis, Routine w reflex microscopic  Result Value Ref Range   Color, Urine YELLOW YELLOW   APPearance CLOUDY (A) CLEAR   Specific Gravity, Urine 1.030 1.005 - 1.030   pH 6.0 5.0 - 8.0   Glucose, UA >1000 (A) NEGATIVE mg/dL   Hgb urine dipstick LARGE (A) NEGATIVE   Bilirubin Urine NEGATIVE NEGATIVE   Ketones, ur 40 (A) NEGATIVE mg/dL   Protein, ur >300  (A) NEGATIVE mg/dL   Urobilinogen, UA 0.2 0.0 - 1.0 mg/dL   Nitrite NEGATIVE NEGATIVE   Leukocytes, UA NEGATIVE NEGATIVE  Urine rapid drug screen (hosp performed)  Result Value Ref Range   Opiates NONE DETECTED NONE DETECTED   Cocaine NONE DETECTED NONE DETECTED   Benzodiazepines NONE DETECTED NONE DETECTED   Amphetamines NONE DETECTED NONE DETECTED   Tetrahydrocannabinol POSITIVE (A) NONE DETECTED   Barbiturates NONE DETECTED NONE DETECTED  Urine microscopic-add on  Result Value Ref Range   Squamous Epithelial / LPF FEW (A) RARE   RBC / HPF 11-20 <3 RBC/hpf   Bacteria, UA FEW (A) RARE   Casts HYALINE CASTS (A) NEGATIVE  POC Urine Pregnancy, ED  (If Pre-menopausal female) - do not order at [facility]  Result Value Ref Range   Preg Test, Ur NEGATIVE NEGATIVE  POC occult blood, ED  Result Value Ref Range   Fecal Occult Bld NEGATIVE NEGATIVE    0125:  Pt continues to c/o abd pain and N/V. No gross hematemesis in the ED. Stool is heme negative. Pt re-medicated for both. BUN/Cr mildly elevated from baseline and CBG is also elevated (pt is not acidotic with AG 12). IVF continues. Will need to trend down CBG, as well as PO challenge. Sign out to Dr. Lita Mains.    Francine Graven, DO 10/31/14 0127

## 2014-10-30 NOTE — ED Notes (Signed)
IV insertion attempted without success X 2

## 2014-10-31 ENCOUNTER — Emergency Department (HOSPITAL_COMMUNITY): Payer: BLUE CROSS/BLUE SHIELD

## 2014-10-31 DIAGNOSIS — R3129 Other microscopic hematuria: Secondary | ICD-10-CM | POA: Diagnosis present

## 2014-10-31 DIAGNOSIS — I10 Essential (primary) hypertension: Secondary | ICD-10-CM | POA: Diagnosis not present

## 2014-10-31 DIAGNOSIS — N179 Acute kidney failure, unspecified: Secondary | ICD-10-CM

## 2014-10-31 DIAGNOSIS — E1165 Type 2 diabetes mellitus with hyperglycemia: Secondary | ICD-10-CM

## 2014-10-31 DIAGNOSIS — G43A1 Cyclical vomiting, intractable: Secondary | ICD-10-CM

## 2014-10-31 DIAGNOSIS — F329 Major depressive disorder, single episode, unspecified: Secondary | ICD-10-CM

## 2014-10-31 LAB — POC OCCULT BLOOD, ED: Fecal Occult Bld: NEGATIVE

## 2014-10-31 LAB — URINALYSIS, ROUTINE W REFLEX MICROSCOPIC
Bilirubin Urine: NEGATIVE
Glucose, UA: >1000 mg/dL — AB
Ketones, ur: 40 mg/dL — AB
Leukocytes, UA: NEGATIVE
Nitrite: NEGATIVE
Protein, ur: >300 mg/dL — AB
Specific Gravity, Urine: 1.03 (ref 1.005–1.030)
Urobilinogen, UA: 0.2 mg/dL (ref 0.0–1.0)
pH: 6 (ref 5.0–8.0)

## 2014-10-31 LAB — GLUCOSE, CAPILLARY
Glucose-Capillary: 349 mg/dL — ABNORMAL HIGH (ref 70–99)
Glucose-Capillary: 117 mg/dL — ABNORMAL HIGH (ref 70–99)
Glucose-Capillary: 176 mg/dL — ABNORMAL HIGH (ref 70–99)

## 2014-10-31 LAB — URINE MICROSCOPIC-ADD ON

## 2014-10-31 LAB — RAPID URINE DRUG SCREEN, HOSP PERFORMED
Amphetamines: NOT DETECTED
Barbiturates: NOT DETECTED
Benzodiazepines: NOT DETECTED
Cocaine: NOT DETECTED
Opiates: NOT DETECTED
Tetrahydrocannabinol: POSITIVE — AB

## 2014-10-31 MED ORDER — PANTOPRAZOLE SODIUM 40 MG IV SOLR
40.0000 mg | Freq: Every day | INTRAVENOUS | Status: DC
  Administered 2014-10-31 – 2014-11-02 (×3): 40 mg via INTRAVENOUS
  Filled 2014-10-31 (×3): qty 40

## 2014-10-31 MED ORDER — PROMETHAZINE HCL 25 MG PO TABS
25.0000 mg | ORAL_TABLET | Freq: Four times a day (QID) | ORAL | Status: DC | PRN
  Administered 2014-10-31: 25 mg via ORAL
  Filled 2014-10-31: qty 1

## 2014-10-31 MED ORDER — PANTOPRAZOLE SODIUM 40 MG IV SOLR
40.0000 mg | Freq: Once | INTRAVENOUS | Status: AC
  Administered 2014-10-31: 40 mg via INTRAVENOUS
  Filled 2014-10-31: qty 40

## 2014-10-31 MED ORDER — TRAZODONE HCL 100 MG PO TABS
100.0000 mg | ORAL_TABLET | Freq: Every day | ORAL | Status: DC
  Administered 2014-10-31 – 2014-11-02 (×3): 100 mg via ORAL
  Filled 2014-10-31 (×3): qty 1

## 2014-10-31 MED ORDER — SODIUM CHLORIDE 0.9 % IV SOLN
INTRAVENOUS | Status: DC
  Administered 2014-10-31 – 2014-11-01 (×2): via INTRAVENOUS

## 2014-10-31 MED ORDER — LISINOPRIL 20 MG PO TABS
20.0000 mg | ORAL_TABLET | Freq: Every day | ORAL | Status: DC
  Administered 2014-10-31 – 2014-11-01 (×2): 20 mg via ORAL
  Filled 2014-10-31: qty 1
  Filled 2014-10-31: qty 2

## 2014-10-31 MED ORDER — INSULIN ASPART 100 UNIT/ML ~~LOC~~ SOLN
0.0000 [IU] | SUBCUTANEOUS | Status: DC
  Administered 2014-10-31: 11 [IU] via SUBCUTANEOUS
  Administered 2014-10-31: 3 [IU] via SUBCUTANEOUS
  Administered 2014-11-01 (×3): 2 [IU] via SUBCUTANEOUS
  Administered 2014-11-01: 3 [IU] via SUBCUTANEOUS
  Administered 2014-11-01: 5 [IU] via SUBCUTANEOUS
  Administered 2014-11-01 – 2014-11-02 (×2): 2 [IU] via SUBCUTANEOUS
  Administered 2014-11-02 – 2014-11-03 (×4): 3 [IU] via SUBCUTANEOUS

## 2014-10-31 MED ORDER — FAMOTIDINE 20 MG PO TABS
20.0000 mg | ORAL_TABLET | Freq: Two times a day (BID) | ORAL | Status: DC
  Administered 2014-10-31 – 2014-11-03 (×7): 20 mg via ORAL
  Filled 2014-10-31 (×7): qty 1

## 2014-10-31 MED ORDER — PROMETHAZINE HCL 25 MG PO TABS
12.5000 mg | ORAL_TABLET | Freq: Four times a day (QID) | ORAL | Status: DC | PRN
  Administered 2014-11-01: 12.5 mg via ORAL
  Filled 2014-10-31: qty 1

## 2014-10-31 MED ORDER — ENOXAPARIN SODIUM 40 MG/0.4ML ~~LOC~~ SOLN
40.0000 mg | SUBCUTANEOUS | Status: DC
  Administered 2014-10-31 – 2014-11-03 (×4): 40 mg via SUBCUTANEOUS
  Filled 2014-10-31 (×4): qty 0.4

## 2014-10-31 MED ORDER — BUPROPION HCL ER (XL) 150 MG PO TB24
150.0000 mg | ORAL_TABLET | Freq: Every day | ORAL | Status: DC
  Administered 2014-10-31 – 2014-11-03 (×4): 150 mg via ORAL
  Filled 2014-10-31 (×4): qty 1

## 2014-10-31 MED ORDER — GI COCKTAIL ~~LOC~~
30.0000 mL | ORAL | Status: AC
  Administered 2014-10-31: 30 mL via ORAL
  Filled 2014-10-31: qty 30

## 2014-10-31 NOTE — H&P (Signed)
Date: 10/31/2014               Patient Name:  Kathy Frank MRN: [id number]  DOB: Aug 25, 1975 Age / Sex: 39 y.o., female   PCP: Riccardo Dubin, MD         Medical Service: Internal Medicine Teaching Service         Attending Physician: Dr. Madilyn Fireman, MD    First Contact: Dr. Julious Oka Pager: [phone]  Second Contact: Dr. Joni Reining Pager: [phone]       After Hours (After 5p/  First Contact Pager: [phone]  weekends / holidays): Second Contact Pager: [phone]   Chief Complaint: Nausea, vomiting  History of Present Illness: [name] is a 39 year old female with history of cyclical intractable nausea/vomiting, substance use [marijuana], hypertension, poorly controlled type 2 diabetes, depression who presents with nausea and vomiting. She was in some distress at the time of interview.  Yesterday, she reports cycling between vomiting and sleeping. She reports her vomitus is mostly food though notes there were "flecks of red."  Her last meal was 2 nights ago at [facility] and consists of salad and fish sandwich. She reports smoking 1-2 blunts of marijuana 4 days ago as well and occasional alcohol use, most recently one beer 2 nights ago; no tobacco use. She does report using BC powder when she has headaches though did not specify frequency. She also reports associated mid-epigastric pain that she describes as sharp, crampy, burning and somewhat improves with lying supine. Other than some intermittent shortness of breath that she feels occasionally, she denies chest pain, diarrhea, fever, sick contacts, missing any of her medications [though cannot report which medications she takes]. Since coming to the ED, she reports feeling a little bit better. In the ED, her pregnancy negative and UDS was positive for THC. Ab & chest XR were without acute findings. She received 1 L normal 7 bolus, Protonix 40 mg IV, Zofran 4 mg IV and 8 mg ODT, Pepcid 20 mg IV, Phenergan 12.5 mg IV, fentanyl 50  g.    Meds: Current Facility-Administered Medications  Medication Dose Route Frequency Provider Last Rate Last Dose  . fentaNYL (SUBLIMAZE) injection 50 mcg  50 mcg Intravenous Q30 min PRN Francine Graven, DO   50 mcg at 10/31/14 0013   Current Outpatient Prescriptions  Medication Sig Dispense Refill  . buPROPion (WELLBUTRIN XL) 150 MG 24 hr tablet Take 1 tablet (150 mg total) by mouth daily. 30 tablet 2  . famotidine (PEPCID) 20 MG tablet Take 1 tablet (20 mg total) by mouth 2 (two) times daily. 30 tablet 0  . lisinopril (PRINIVIL,ZESTRIL) 20 MG tablet Take 1 tablet (20 mg total) by mouth daily. 30 tablet 6  . metFORMIN (GLUCOPHAGE) 500 MG tablet Take 2 tablets (1,000 mg total) by mouth 2 (two) times daily with a meal. 60 tablet 2  . oxyCODONE-acetaminophen (PERCOCET) 5-325 MG per tablet Take 1-2 tablets by mouth every 4 (four) hours as needed. 20 tablet 0  . promethazine (PHENERGAN) 25 MG tablet Take 1 tablet (25 mg total) by mouth every 6 (six) hours as needed for nausea or vomiting. 12 tablet 0  . traZODone (DESYREL) 100 MG tablet Take 1 tablet (100 mg total) by mouth at bedtime. 30 tablet 2  . metoCLOPramide (REGLAN) 5 MG tablet Take 1 tablet (5 mg total) by mouth 3 (three) times daily before meals. (Patient not taking: Reported on 10/30/2014) 90 tablet 6  . ondansetron (ZOFRAN ODT) 4 MG disintegrating  tablet Take 1 tablet (4 mg total) by mouth every 8 (eight) hours as needed for nausea or vomiting. (Patient not taking: Reported on 10/30/2014) 20 tablet 0  . ondansetron (ZOFRAN) 4 MG tablet Take 1 tablet (4 mg total) by mouth every 6 (six) hours. (Patient not taking: Reported on 10/10/2014) 12 tablet 0  . pantoprazole (PROTONIX) 40 MG tablet Take 1 tablet (40 mg total) by mouth daily. (Patient not taking: Reported on 10/30/2014) 30 tablet 6    Allergies: Allergies as of 10/30/2014  . (No Known Allergies)   Past Medical History  Diagnosis Date  . Depression   . Type II diabetes  mellitus   . Hypertension   . Gastroparesis   . Cyclical vomiting     THC induced   Past Surgical History  Procedure Laterality Date  . Tubal ligation  2002   History reviewed. No pertinent family history. History   Social History  . Marital Status: Single    Spouse Name: N/A  . Number of Children: N/A  . Years of Education: N/A   Occupational History  . Not on file.   Social History Main Topics  . Smoking status: Former Smoker -- 0.10 packs/day for 10 years    Types: Cigarettes    Start date: 11/17/2013  . Smokeless tobacco: Never Used  . Alcohol Use: Yes     Comment: 11/13/2013 "occasionally; once or twice/month"  . Drug Use: No  . Sexual Activity: Not Currently   Other Topics Concern  . Not on file   Social History Narrative    Review of Systems: As noted in the history of present illness though limited by her state of distress  Physical Exam: Blood pressure 159/51, pulse 101, temperature 97.9 F (36.6 C), temperature source Oral, resp. rate 18, height 5\' 5"  (1.651 m), weight 170 lb (77.111 kg), last menstrual period 10/12/2014, SpO2 99 %.  General: Young African-American female, multiple tattoos noted all over, restless from pain HEENT: PERRL, EOMI, no scleral icterus, oropharynx with dry mucous membranes Cardiac: tachycardic, no rubs, murmurs or gallops Pulm: clear to auscultation bilaterally, no wheezes, rales, or rhonchi Abd: BS present, tender to palpation over RUQ with the remainder of the exam limited by patient's pain Ext: warm and well perfused, no pedal edema Neuro: responds to questions appropriately; moving all extremities freely   Lab results: Basic Metabolic Panel:  Recent Labs  10/30/14 2146  NA 135  K 3.7  CL 101  CO2 22  GLUCOSE 325*  BUN 13  CREATININE 1.19*  CALCIUM 9.1   Liver Function Tests:  Recent Labs  10/30/14 2146  AST 21  ALT 13  ALKPHOS 87  BILITOT 0.8  PROT 7.5  ALBUMIN 3.0*    Recent Labs  10/30/14 2146   LIPASE 31   CBC:  Recent Labs  10/30/14 2146  WBC 10.5  NEUTROABS 9.6*  HGB 12.8  HCT 36.4  MCV 83.7  PLT 361   Urine Drug Screen: Drugs of Abuse     Component Value Date/Time   LABOPIA NONE DETECTED 10/30/2014 2349   COCAINSCRNUR NONE DETECTED 10/30/2014 2349   LABBENZ NONE DETECTED 10/30/2014 2349   AMPHETMU NONE DETECTED 10/30/2014 2349   THCU POSITIVE* 10/30/2014 2349   LABBARB NONE DETECTED 10/30/2014 2349    Urinalysis:  Recent Labs  10/30/14 2349  COLORURINE YELLOW  LABSPEC 1.030  PHURINE 6.0  GLUCOSEU >1000*  HGBUR LARGE*  BILIRUBINUR NEGATIVE  KETONESUR 40*  PROTEINUR >300*  UROBILINOGEN 0.2  NITRITE NEGATIVE  LEUKOCYTESUR NEGATIVE    Imaging results:  Dg Abd Acute W/chest  10/31/2014   CLINICAL DATA:  Acute onset of periumbilical abdominal pain and vomiting. Hematemesis. Initial encounter.  EXAM: ACUTE ABDOMEN SERIES (ABDOMEN 2 VIEW & CHEST 1 VIEW)  COMPARISON:  Chest and abdominal radiographs performed 11/13/2013  FINDINGS: The lungs are well-aerated and clear. There is no evidence of focal opacification, pleural effusion or pneumothorax. The cardiomediastinal silhouette is within normal limits.  The visualized bowel gas pattern is unremarkable. There is a relative paucity of bowel gas within the abdomen. There is no evidence of small bowel dilatation to suggest obstruction. No free intra-abdominal air is identified on the provided upright view.  No acute osseous abnormalities are seen; the sacroiliac joints are unremarkable in appearance.  IMPRESSION: 1. Unremarkable bowel gas pattern; no free intra-abdominal air seen. Relative paucity of bowel gas within the abdomen. 2. No acute cardiopulmonary process seen.   Electronically Signed   By: Garald Balding M.D.   On: 10/31/2014 01:41    Assessment & Plan by Problem:  Intractable cyclical nausea/vomiting: Cannabinoid hyperemesis syndrome is certainly suspect given her chronic marijuana use. She does also  have a history of gastroparesis noted which could be contributory, especially given her poorly controlled diabetes, and unsure if she has been adherent to Reglan. No prior gastric emptying study on file. Her use of BC powders also makes peptic ulcer disease a possibility though unclear how often she is using; stable hemoglobin is reassuring for no acute blood loss. Polysubstance use also suspect given her marijuana use though UDS is otherwise negative for other substances. She received oxycodone 5/325 mg 20 tabs on 10/11/2014 per [facility] but no chronic opioids which could also cause constipation. Urine pregnancy was negative in the ED. Lipase and LFTs unremarkable which are reassuring for no hepatobiliary process. Her medications include Zofran 4 mg ODT, Protonix 40 mg, Reglan 5 mg 3 times daily before meals, -Continue Pepcid 20 mg twice daily -Give Phenergan 25 mg now continue Phenergan 12.5 mg every 6 hours as needed for nausea -Check EKG as she received multiple medications that can prolong QTC while in the ED -Counsel patient on marijuana use  Acute kidney injury: Crt 1.19, baseline 0.5-0.8. Likely in the setting of acute illness, especially given physical exam findings and tachycardia. -Hold home lisinopril  HTN: Systolic blood pressure 123456 to 180s in the emergency department. She takes lisinopril 20 mg. -As noted above  Poorly controlled Type 2 DM: A1c 10.0, 11/13/2013. Home meds include metformin 500 mg -Check A1c -Check CBGs every 4 hours -Continue sliding scale insulin  Depression: Home meds include Wellbutrin 150 mg, trazodone 100 mg -Continue Wellbutrin -Continue trazodone  #FEN:  -Diet: nothing by mouth  #DVT prophylaxis: Lovenox  #CODE STATUS: FULL CODE   Dispo: Disposition is deferred at this time, awaiting improvement of current medical problems.   The patient does have a current PCP (Keyairra Kolinski Sherrye Payor, MD) and does need an [facility] hospital follow-up  appointment after discharge.  The patient does not know have transportation limitations that hinder transportation to clinic appointments.  Signed: Riccardo Dubin, MD 10/31/2014, 4:48 AM

## 2014-10-31 NOTE — Progress Notes (Signed)
Subjective: Pt still have n/v. States she was vomiting ginger ale given in the ED. States she last drank a few days ago and she only drank 2 beers. States she does not smoke marijuana everyday and smokes occasionally to help her eat.   Objective: Vital signs in last 24 hours: Filed Vitals:   10/31/14 0350 10/31/14 0600 10/31/14 0626 10/31/14 0837  BP: 159/51 139/76  161/92  Pulse: 101  102 113  Temp:    98.2 F (36.8 C)  TempSrc:    Oral  Resp: 18  16 16   Height:      Weight:      SpO2: 99%  100% 100%   Weight change:   Intake/Output Summary (Last 24 hours) at 10/31/14 0906 Last data filed at 10/31/14 0132  Gross per 24 hour  Intake     50 ml  Output      0 ml  Net     50 ml   General:sitting in recliner at beside with trashcan and emesis bin nearby Lungs: CTAB, no wheezing Cardiac: RRR, no murmurs GI: TTP mid epigastric region, active bowel sounds Neuro: CN II-XII grossly intact  Lab Results: Basic Metabolic Panel:  Recent Labs Lab 10/30/14 2146  NA 135  K 3.7  CL 101  CO2 22  GLUCOSE 325*  BUN 13  CREATININE 1.19*  CALCIUM 9.1   Liver Function Tests:  Recent Labs Lab 10/30/14 2146  AST 21  ALT 13  ALKPHOS 87  BILITOT 0.8  PROT 7.5  ALBUMIN 3.0*    Recent Labs Lab 10/30/14 2146  LIPASE 31   CBC:  Recent Labs Lab 10/30/14 2146  WBC 10.5  NEUTROABS 9.6*  HGB 12.8  HCT 36.4  MCV 83.7  PLT 361   Urine Drug Screen: Drugs of Abuse     Component Value Date/Time   LABOPIA NONE DETECTED 10/30/2014 2349   COCAINSCRNUR NONE DETECTED 10/30/2014 2349   LABBENZ NONE DETECTED 10/30/2014 2349   AMPHETMU NONE DETECTED 10/30/2014 2349   THCU POSITIVE* 10/30/2014 2349   LABBARB NONE DETECTED 10/30/2014 2349    Urinalysis:  Recent Labs Lab 10/30/14 2349  COLORURINE YELLOW  LABSPEC 1.030  PHURINE 6.0  GLUCOSEU >1000*  HGBUR LARGE*  BILIRUBINUR NEGATIVE  KETONESUR 40*  PROTEINUR >300*  UROBILINOGEN 0.2  NITRITE NEGATIVE    LEUKOCYTESUR NEGATIVE   Studies/Results: Dg Abd Acute W/chest  10/31/2014   CLINICAL DATA:  Acute onset of periumbilical abdominal pain and vomiting. Hematemesis. Initial encounter.  EXAM: ACUTE ABDOMEN SERIES (ABDOMEN 2 VIEW & CHEST 1 VIEW)  COMPARISON:  Chest and abdominal radiographs performed 11/13/2013  FINDINGS: The lungs are well-aerated and clear. There is no evidence of focal opacification, pleural effusion or pneumothorax. The cardiomediastinal silhouette is within normal limits.  The visualized bowel gas pattern is unremarkable. There is a relative paucity of bowel gas within the abdomen. There is no evidence of small bowel dilatation to suggest obstruction. No free intra-abdominal air is identified on the provided upright view.  No acute osseous abnormalities are seen; the sacroiliac joints are unremarkable in appearance.  IMPRESSION: 1. Unremarkable bowel gas pattern; no free intra-abdominal air seen. Relative paucity of bowel gas within the abdomen. 2. No acute cardiopulmonary process seen.   Electronically Signed   By: Garald Balding M.D.   On: 10/31/2014 01:41   Medications: I have reviewed the patient's current medications. Scheduled Meds: . buPROPion  150 mg Oral Daily  . enoxaparin (LOVENOX) injection  40 mg  Subcutaneous Q24H  . famotidine  20 mg Oral BID  . insulin aspart  0-15 Units Subcutaneous 6 times per day  . traZODone  100 mg Oral QHS   Continuous Infusions: . sodium chloride 125 mL/hr at 10/31/14 0859   PRN Meds:.promethazine Assessment/Plan: Principal Problem:   Intractable cyclical vomiting Active Problems:   Diabetes mellitus type II, uncontrolled   Hypertension   Intractable nausea and vomiting   Intractable cyclical nausea/vomiting: Cannabinoid hyperemesis syndrome vs gastroparesis. No prior gastric emptying study on file. Pt still have n/v, will continue to monitor. Pt instructed she can take a hot shower to see if she has resolution of symptoms, if so  then likely cause of n/v is cannabinoid hyperemesis syndrome. Abd xray negative. FOBT negative. - Pepcid 20 mg twice daily - started IV protonix 40mg  daily - Phenergan 12.5 mg every 6 hours as needed for nausea - EKG pending to check QTc, nurse aware - GI cocktail given for chest burning likely due to vomiting and GERD - gastric emptying study as outpatient  Acute kidney injury: Crt 1.19, baseline 0.5-0.8. Likely in the setting of acute illness, especially given physical exam findings and tachycardia. -continued lisinopril, - recheck BMET tomorrow morning  HTN: Systolic blood pressure [id number] to 180s in the emergency department. She takes lisinopril 20 mg. BP elevated at 173/89 this morning likely due to n/v and abdominal pain, will cont to monitor -cont lisinopril  Poorly controlled Type 2 DM: A1c 10.0, 11/13/2013. Home meds include metformin 500 mg -A1c pending - sliding scale insulin  Depression: Home meds include Wellbutrin 150 mg, trazodone 100 mg -Continue Wellbutrin -Continue trazodone  #FEN:  -Diet: nothing by mouth - advance diet as tolerated  #DVT prophylaxis: Lovenox  #CODE STATUS: FULL CODE   Dispo: Disposition is deferred at this time, awaiting improvement of current medical problems.   The patient does have a current PCP (Rushil Sherrye Payor, MD) and does need an [facility] hospital follow-up appointment after discharge.  The patient does not know have transportation limitations that hinder transportation to clinic appointments  .Services Needed at time of discharge: Y = Yes, Blank = No PT:   OT:   RN:   Equipment:   Other:       Norman Herrlich, MD 10/31/2014, 9:06 AM

## 2014-10-31 NOTE — ED Notes (Signed)
Patient transported to X-ray 

## 2014-10-31 NOTE — Progress Notes (Signed)
Pt tolerated dinner eating 50% of her meal without nausea or vomiting. She denies pain or discomfort. No noted distress. Will continue to monitor.

## 2014-10-31 NOTE — ED Notes (Signed)
Pt vomited, flecks of red noted in emesis. Dr. Thurnell Garbe notified.

## 2014-10-31 NOTE — ED Notes (Signed)
Pt called out and stated that she drank half of the ginger ale and her abdominal pain was back pain 5/10

## 2014-10-31 NOTE — Progress Notes (Signed)
Pt requested crackers and juice,  tolerated well. Pt denies nausea, no vomiting. Pt stated that she would like to try a meal for dinner. Order placed for dinner tray. Will continue to monitor. No noted distress. Call bell within reach.

## 2014-10-31 NOTE — ED Notes (Signed)
Pt given ginger ale and crackers to try and eat.

## 2014-10-31 NOTE — Progress Notes (Signed)
Pt arrived to 4N05 at current time.  Pt A&O x 4, c/o 5-10 ABD pain.  Pt V/S taken,  Pt with minimal distress r/t ABD pain.  RN notified of arrival.

## 2014-10-31 NOTE — ED Provider Notes (Signed)
Angiocath insertion Performed by: Larence Penning  Consent: Verbal consent obtained. Risks and benefits: risks, benefits and alternatives were discussed Time out: Immediately prior to procedure a "time out" was called to verify the correct patient, procedure, equipment, support staff and site/side marked as required.  Preparation: Patient was prepped and draped in the usual sterile fashion.  Vein Location: right brachial  Ultrasound Guided  Gauge: 20  Normal blood return and flush without difficulty Patient tolerance: Patient tolerated the procedure well with no immediate complications.    Larence Penning, MD 10/31/14 0005  Elnora Morrison, MD 10/31/14 [phone]

## 2014-11-01 DIAGNOSIS — F1210 Cannabis abuse, uncomplicated: Secondary | ICD-10-CM | POA: Diagnosis present

## 2014-11-01 DIAGNOSIS — Z794 Long term (current) use of insulin: Secondary | ICD-10-CM

## 2014-11-01 DIAGNOSIS — E1143 Type 2 diabetes mellitus with diabetic autonomic (poly)neuropathy: Secondary | ICD-10-CM | POA: Diagnosis present

## 2014-11-01 DIAGNOSIS — R109 Unspecified abdominal pain: Secondary | ICD-10-CM | POA: Diagnosis present

## 2014-11-01 DIAGNOSIS — N179 Acute kidney failure, unspecified: Secondary | ICD-10-CM | POA: Diagnosis present

## 2014-11-01 DIAGNOSIS — K219 Gastro-esophageal reflux disease without esophagitis: Secondary | ICD-10-CM | POA: Diagnosis present

## 2014-11-01 DIAGNOSIS — F329 Major depressive disorder, single episode, unspecified: Secondary | ICD-10-CM | POA: Diagnosis present

## 2014-11-01 DIAGNOSIS — E1165 Type 2 diabetes mellitus with hyperglycemia: Secondary | ICD-10-CM | POA: Diagnosis present

## 2014-11-01 DIAGNOSIS — I10 Essential (primary) hypertension: Secondary | ICD-10-CM | POA: Diagnosis present

## 2014-11-01 DIAGNOSIS — R312 Other microscopic hematuria: Secondary | ICD-10-CM | POA: Diagnosis present

## 2014-11-01 DIAGNOSIS — E876 Hypokalemia: Secondary | ICD-10-CM | POA: Diagnosis not present

## 2014-11-01 DIAGNOSIS — Z79899 Other long term (current) drug therapy: Secondary | ICD-10-CM | POA: Diagnosis not present

## 2014-11-01 DIAGNOSIS — G43A1 Cyclical vomiting, intractable: Secondary | ICD-10-CM | POA: Diagnosis present

## 2014-11-01 DIAGNOSIS — K3184 Gastroparesis: Secondary | ICD-10-CM | POA: Diagnosis present

## 2014-11-01 DIAGNOSIS — Z87891 Personal history of nicotine dependence: Secondary | ICD-10-CM | POA: Diagnosis not present

## 2014-11-01 LAB — GLUCOSE, CAPILLARY
Glucose-Capillary: 129 mg/dL — ABNORMAL HIGH (ref 70–99)
Glucose-Capillary: 134 mg/dL — ABNORMAL HIGH (ref 70–99)
Glucose-Capillary: 140 mg/dL — ABNORMAL HIGH (ref 70–99)
Glucose-Capillary: 149 mg/dL — ABNORMAL HIGH (ref 70–99)
Glucose-Capillary: 150 mg/dL — ABNORMAL HIGH (ref 70–99)
Glucose-Capillary: 154 mg/dL — ABNORMAL HIGH (ref 70–99)
Glucose-Capillary: 204 mg/dL — ABNORMAL HIGH (ref 70–99)

## 2014-11-01 LAB — BASIC METABOLIC PANEL
Anion gap: 11 (ref 5–15)
BUN: 22 mg/dL (ref 6–23)
CO2: 20 mmol/L (ref 19–32)
Calcium: 8.7 mg/dL (ref 8.4–10.5)
Chloride: 104 mmol/L (ref 96–112)
Creatinine, Ser: 1.45 mg/dL — ABNORMAL HIGH (ref 0.50–1.10)
GFR calc Af Amer: 52 mL/min — ABNORMAL LOW (ref >90)
GFR calc non Af Amer: 45 mL/min — ABNORMAL LOW (ref >90)
Glucose, Bld: 175 mg/dL — ABNORMAL HIGH (ref 70–99)
Potassium: 3.5 mmol/L (ref 3.5–5.1)
Sodium: 135 mmol/L (ref 135–145)

## 2014-11-01 LAB — HEMOGLOBIN A1C
Hgb A1c MFr Bld: 9.9 % — ABNORMAL HIGH (ref 4.8–5.6)
Mean Plasma Glucose: 237 mg/dL

## 2014-11-01 MED ORDER — PROMETHAZINE HCL 25 MG/ML IJ SOLN: 12.5000 mg | Freq: Once | INTRAMUSCULAR | Status: DC

## 2014-11-01 MED ORDER — PROMETHAZINE HCL 25 MG PO TABS
25.0000 mg | ORAL_TABLET | Freq: Four times a day (QID) | ORAL | Status: DC | PRN
  Administered 2014-11-01 – 2014-11-02 (×2): 25 mg via ORAL
  Filled 2014-11-01 (×3): qty 1

## 2014-11-01 MED ORDER — PROMETHAZINE HCL 25 MG/ML IJ SOLN
Freq: Once | INTRAMUSCULAR | Status: DC
  Filled 2014-11-01: qty 1

## 2014-11-01 MED ORDER — PROMETHAZINE HCL 25 MG/ML IJ SOLN
12.5000 mg | Freq: Once | INTRAMUSCULAR | Status: AC
  Administered 2014-11-01: 12.5 mg via INTRAVENOUS

## 2014-11-01 MED ORDER — ONDANSETRON HCL 4 MG/2ML IJ SOLN: 4.0000 mg | INTRAMUSCULAR | Status: DC | PRN

## 2014-11-01 MED ORDER — BACITRACIN-NEOMYCIN-POLYMYXIN OINTMENT TUBE
TOPICAL_OINTMENT | CUTANEOUS | Status: DC | PRN
  Administered 2014-11-01: 1 via TOPICAL
  Filled 2014-11-01: qty 15

## 2014-11-01 MED ORDER — PROMETHAZINE HCL 25 MG/ML IJ SOLN
25.0000 mg | Freq: Once | INTRAMUSCULAR | Status: AC
  Administered 2014-11-01: 25 mg via INTRAVENOUS
  Filled 2014-11-01 (×2): qty 1

## 2014-11-01 MED ORDER — PROMETHAZINE HCL 25 MG/ML IJ SOLN: 25.0000 mg | Freq: Once | INTRAMUSCULAR | Status: DC

## 2014-11-01 MED ORDER — ALUM & MAG HYDROXIDE-SIMETH 200-200-20 MG/5ML PO SUSP
30.0000 mL | Freq: Four times a day (QID) | ORAL | Status: DC | PRN
  Administered 2014-11-01: 30 mL via ORAL
  Filled 2014-11-01: qty 30

## 2014-11-01 NOTE — Progress Notes (Signed)
Subjective: Pt was awake and stated that she had vomited four times this morning. She said she felt nauseous when she was vomiting. She was given some phenergan 12.5mg  this morning for her vomiting and reported that she was not feeling nauseous after that. She said she was able to eat about half of her dinner last night without any nausea or vomiting.   Nurses note said that she tolerated her advanced diet last night and reported her vomiting this morning was likely due to her GERD.  Objective: Vital signs in last 24 hours: Filed Vitals:   10/31/14 1723 10/31/14 2136 11/01/14 0109 11/01/14 0554  BP: 135/76 153/96 143/85 138/98  Pulse: 101 98 94 91  Temp: 98.6 F (37 C) 98.7 F (37.1 C) 99 F (37.2 C) 98.3 F (36.8 C)  TempSrc: Oral Oral Oral Oral  Resp: 16 18 18 18   Height:      Weight:      SpO2: 98% 94% 100% 100%   Weight change:  No intake or output data in the 24 hours ending 11/01/14 0946 BP 138/98 mmHg  Pulse 91  Temp(Src) 98.3 F (36.8 C) (Oral)  Resp 18  Ht 5\' 5"  (1.651 m)  Wt 77.111 kg (170 lb)  BMI 28.29 kg/m2  SpO2 100%  LMP 10/12/2014 (Exact Date) General appearance: alert, cooperative and in pain lying in bed Lungs: clear to auscultation bilaterally Heart: regular rate and rhythm and S1, S2 normal Abdomen: NABS; soft, tender to light palpitation on upper abdomen Lab Results: BMP Latest Ref Rng 11/01/2014 10/30/2014 10/10/2014  Glucose 70 - 99 mg/dL 175(H) 325(H) 359(H)  BUN 6 - 23 mg/dL 22 13 13   Creatinine 0.50 - 1.10 mg/dL 1.45(H) 1.19(H) 1.08  Sodium 135 - 145 mmol/L 135 135 138  Potassium 3.5 - 5.1 mmol/L 3.5 3.7 3.7  Chloride 96 - 112 mmol/L 104 101 102  CO2 19 - 32 mmol/L 20 22 26   Calcium 8.4 - 10.5 mg/dL 8.7 9.1 9.3   CBC    Component Value Date/Time   WBC 10.5 10/30/2014 2146   RBC 4.35 10/30/2014 2146   HGB 12.8 10/30/2014 2146   HCT 36.4 10/30/2014 2146   PLT 361 10/30/2014 2146   MCV 83.7 10/30/2014 2146   MCH 29.4 10/30/2014 2146   MCHC 35.2 10/30/2014 2146   RDW 13.2 10/30/2014 2146   LYMPHSABS 0.6* 10/30/2014 2146   MONOABS 0.2 10/30/2014 2146   EOSABS 0.0 10/30/2014 2146   BASOSABS 0.0 10/30/2014 2146   HBA1C:  - 10/31/2014: 9.9 - 11/13/2013: 10.0 - 12/05/2012: 11.4  Micro Results: No results found for this or any previous visit (from the past 240 hour(s)). Studies/Results: Dg Abd Acute W/chest  10/31/2014   CLINICAL DATA:  Acute onset of periumbilical abdominal pain and vomiting. Hematemesis. Initial encounter.  EXAM: ACUTE ABDOMEN SERIES (ABDOMEN 2 VIEW & CHEST 1 VIEW)  COMPARISON:  Chest and abdominal radiographs performed 11/13/2013  FINDINGS: The lungs are well-aerated and clear. There is no evidence of focal opacification, pleural effusion or pneumothorax. The cardiomediastinal silhouette is within normal limits.  The visualized bowel gas pattern is unremarkable. There is a relative paucity of bowel gas within the abdomen. There is no evidence of small bowel dilatation to suggest obstruction. No free intra-abdominal air is identified on the provided upright view.  No acute osseous abnormalities are seen; the sacroiliac joints are unremarkable in appearance.  IMPRESSION: 1. Unremarkable bowel gas pattern; no free intra-abdominal air seen. Relative paucity of bowel gas within  the abdomen. 2. No acute cardiopulmonary process seen.   Electronically Signed   By: Garald Balding M.D.   On: 10/31/2014 01:41   Medications: I have reviewed the patient's current medications. Scheduled Meds: . buPROPion  150 mg Oral Daily  . enoxaparin (LOVENOX) injection  40 mg Subcutaneous Q24H  . famotidine  20 mg Oral BID  . insulin aspart  0-15 Units Subcutaneous 6 times per day  . lisinopril  20 mg Oral Daily  . pantoprazole (PROTONIX) IV  40 mg Intravenous Daily  . traZODone  100 mg Oral QHS   Continuous Infusions: . sodium chloride 125 mL/hr at 11/01/14 0523   PRN Meds:.promethazine Assessment/Plan: Principal Problem:    Intractable cyclical vomiting Active Problems:   Diabetes mellitus type II, uncontrolled   Hypertension   Intractable nausea and vomiting   Microscopic hematuria   Marijuana abuse  Ms. Elsner is a 39yo female with a history of gastroparesis, GERD, Type II DM, and marijuana abuse who presented with a one day history of nausea and vomiting.  Intractable cyclic vomiting: The patient's vomiting could be caused by her marijuana abuse causing cannabinoid hyperemesis syndrome. Patient stated on admission that she occasionally uses marijuana because it helps her eat. UDS was positive for THC. It could also be caused by her history of gastroparesis. There is no gastroparesis study on file. The patient was able to tolerate 50% of her meal last night without nausea or vomiting. Therefore, the vomiting this morning was could have been caused by her GERD given its reported acid consistency. - Continue Pepcid 20mg  BID - Continue Protonix: 40mg  daily - Continue Phenergan 25mg  q6hr PRN - Continue Famotidine 20mg  BID - Consider outpatient gastric emptying study on discharge when patient's complaints resolve - Continue NS 125cc/hr   FEN: electrolytes normal on BMET this morning. Tolerated solid foods last night - Continue advanced diet as tolerated  Type 2 diabetes mellitus: The patient's HBA1C came back at 9.9 this morning suggesting chronic poor glycemic control. Historically her HBA1Cs have been elevated ranging from 9.4-11.4.  - Continue novolog sliding scale 0-15 units  HTN: Patient's BP was 138/98 this morning. She is on outpatient lisinopril 20mg  daily. BP on admission in the ED was 159/51.  - Continue lisinopril 20mg  daily  Depression: Patient treated for depression as outpatient. - Continue Wellbutrin 150mg  daily - Continue Trazadone 100mg  daily  DVT prophylaxis: Lovenox  Code status: full code  Dispo: Disposition deferred at this time.  This is a Careers information officer Note.  The care of the  patient was discussed with Dr. Hulen Luster and the assessment and plan formulated with their assistance.  Please see their attached note for official documentation of the daily encounter.     Gery Pray, Med Student 11/01/2014, 9:46 AM

## 2014-11-01 NOTE — Progress Notes (Signed)
UR completed 

## 2014-11-01 NOTE — Progress Notes (Signed)
Subjective: Pt tolerated dinner last night but then had n/v this morning. States she vomited acid and not her food.   Objective: Vital signs in last 24 hours: Filed Vitals:   10/31/14 2136 11/01/14 0109 11/01/14 0554 11/01/14 0951  BP: 153/96 143/85 138/98 144/84  Pulse: 98 94 91 93  Temp: 98.7 F (37.1 C) 99 F (37.2 C) 98.3 F (36.8 C) 98.7 F (37.1 C)  TempSrc: Oral Oral Oral Oral  Resp: 18 18 18 18   Height:      Weight:      SpO2: 94% 100% 100% 100%   General: nauseated Lungs: CTAB, no wheezing Cardiac: RRR, no murmurs GI: TTP mid epigastric region, active bowel sounds Neuro: CN II-XII grossly intact  Lab Results: Basic Metabolic Panel:  Recent Labs Lab 10/30/14 2146 11/01/14 0730  NA 135 135  K 3.7 3.5  CL 101 104  CO2 22 20  GLUCOSE 325* 175*  BUN 13 22  CREATININE 1.19* 1.45*  CALCIUM 9.1 8.7   Liver Function Tests:  Recent Labs Lab 10/30/14 2146  AST 21  ALT 13  ALKPHOS 87  BILITOT 0.8  PROT 7.5  ALBUMIN 3.0*    Recent Labs Lab 10/30/14 2146  LIPASE 31   CBC:  Recent Labs Lab 10/30/14 2146  WBC 10.5  NEUTROABS 9.6*  HGB 12.8  HCT 36.4  MCV 83.7  PLT 361   Urine Drug Screen: Drugs of Abuse     Component Value Date/Time   LABOPIA NONE DETECTED 10/30/2014 2349   COCAINSCRNUR NONE DETECTED 10/30/2014 2349   LABBENZ NONE DETECTED 10/30/2014 2349   AMPHETMU NONE DETECTED 10/30/2014 2349   THCU POSITIVE* 10/30/2014 2349   LABBARB NONE DETECTED 10/30/2014 2349    Urinalysis:  Recent Labs Lab 10/30/14 2349  COLORURINE YELLOW  LABSPEC 1.030  PHURINE 6.0  GLUCOSEU >1000*  HGBUR LARGE*  BILIRUBINUR NEGATIVE  KETONESUR 40*  PROTEINUR >300*  UROBILINOGEN 0.2  NITRITE NEGATIVE  LEUKOCYTESUR NEGATIVE   Studies/Results: Dg Abd Acute W/chest  10/31/2014   CLINICAL DATA:  Acute onset of periumbilical abdominal pain and vomiting. Hematemesis. Initial encounter.  EXAM: ACUTE ABDOMEN SERIES (ABDOMEN 2 VIEW & CHEST 1 VIEW)   COMPARISON:  Chest and abdominal radiographs performed 11/13/2013  FINDINGS: The lungs are well-aerated and clear. There is no evidence of focal opacification, pleural effusion or pneumothorax. The cardiomediastinal silhouette is within normal limits.  The visualized bowel gas pattern is unremarkable. There is a relative paucity of bowel gas within the abdomen. There is no evidence of small bowel dilatation to suggest obstruction. No free intra-abdominal air is identified on the provided upright view.  No acute osseous abnormalities are seen; the sacroiliac joints are unremarkable in appearance.  IMPRESSION: 1. Unremarkable bowel gas pattern; no free intra-abdominal air seen. Relative paucity of bowel gas within the abdomen. 2. No acute cardiopulmonary process seen.   Electronically Signed   By: Garald Balding M.D.   On: 10/31/2014 01:41   Medications: I have reviewed the patient's current medications. Scheduled Meds: . buPROPion  150 mg Oral Daily  . enoxaparin (LOVENOX) injection  40 mg Subcutaneous Q24H  . famotidine  20 mg Oral BID  . insulin aspart  0-15 Units Subcutaneous 6 times per day  . lisinopril  20 mg Oral Daily  . pantoprazole (PROTONIX) IV  40 mg Intravenous Daily  . traZODone  100 mg Oral QHS   Continuous Infusions: . sodium chloride 125 mL/hr at 11/01/14 0523   PRN Meds:.promethazine  Assessment/Plan: Principal Problem:   Intractable cyclical vomiting Active Problems:   Diabetes mellitus type II, uncontrolled   Hypertension   Intractable nausea and vomiting   Microscopic hematuria   Marijuana abuse   Intractable cyclical nausea/vomiting: - Pepcid 20 mg twice daily - IV protonix 40mg  daily - NS at 125cc/hr - Phenergan25 mg po every 6 hours as needed for nausea - gastric emptying study and further GI workup as outpatient - advance diet as tolerated  Acute kidney injury: Cr up 1.45 from 1.19 yesterday, baseline 0.5-0.8.likely 2/2 acute n/v -continued lisinopril  -  recheck BMET at 1:30 pm today - NS at 125cc/hr  HTN:. She takes lisinopril 20 mg. BP improved since admission, currently 144/84 -cont lisinopril  Poorly controlled Type 2 DM: A1c 10.0, 11/13/2013. Home meds include metformin 500 mg -A1c 9.9 - sliding scale insulin  Depression: Home meds include Wellbutrin 150 mg, trazodone 100 mg -Continue Wellbutrin -Continue trazodone  #FEN:  -Diet: liquid diet,  advance diet as tolerated  #DVT prophylaxis: Lovenox  #CODE STATUS: FULL CODE   Dispo: Disposition is deferred at this time, awaiting improvement of current medical problems.   The patient does have a current PCP (Rushil Sherrye Payor, MD) and does need an [facility] hospital follow-up appointment after discharge.  The patient does not know have transportation limitations that hinder transportation to clinic appointments  .Services Needed at time of discharge: Y = Yes, Blank = No PT:   OT:   RN:   Equipment:   Other:       Norman Herrlich, MD 11/01/2014, 10:04 AM

## 2014-11-02 DIAGNOSIS — K219 Gastro-esophageal reflux disease without esophagitis: Secondary | ICD-10-CM | POA: Insufficient documentation

## 2014-11-02 LAB — BASIC METABOLIC PANEL
Anion gap: 6 (ref 5–15)
Anion gap: 11 (ref 5–15)
BUN: 14 mg/dL (ref 6–23)
BUN: 16 mg/dL (ref 6–23)
CO2: 22 mmol/L (ref 19–32)
CO2: 19 mmol/L (ref 19–32)
Calcium: 8.1 mg/dL — ABNORMAL LOW (ref 8.4–10.5)
Calcium: 8.3 mg/dL — ABNORMAL LOW (ref 8.4–10.5)
Chloride: 111 mmol/L (ref 96–112)
Chloride: 107 mmol/L (ref 96–112)
Creatinine, Ser: 1.38 mg/dL — ABNORMAL HIGH (ref 0.50–1.10)
Creatinine, Ser: 1.24 mg/dL — ABNORMAL HIGH (ref 0.50–1.10)
GFR calc Af Amer: 55 mL/min — ABNORMAL LOW (ref >90)
GFR calc Af Amer: 63 mL/min — ABNORMAL LOW (ref >90)
GFR calc non Af Amer: 48 mL/min — ABNORMAL LOW (ref >90)
GFR calc non Af Amer: 54 mL/min — ABNORMAL LOW (ref >90)
Glucose, Bld: 117 mg/dL — ABNORMAL HIGH (ref 70–99)
Glucose, Bld: 204 mg/dL — ABNORMAL HIGH (ref 70–99)
Potassium: 3 mmol/L — ABNORMAL LOW (ref 3.5–5.1)
Potassium: 3.8 mmol/L (ref 3.5–5.1)
Sodium: 139 mmol/L (ref 135–145)
Sodium: 137 mmol/L (ref 135–145)

## 2014-11-02 LAB — GLUCOSE, CAPILLARY
Glucose-Capillary: 120 mg/dL — ABNORMAL HIGH (ref 70–99)
Glucose-Capillary: 171 mg/dL — ABNORMAL HIGH (ref 70–99)
Glucose-Capillary: 223 mg/dL — ABNORMAL HIGH (ref 70–99)
Glucose-Capillary: 173 mg/dL — ABNORMAL HIGH (ref 70–99)
Glucose-Capillary: 152 mg/dL — ABNORMAL HIGH (ref 70–99)

## 2014-11-02 MED ORDER — AMLODIPINE BESYLATE 5 MG PO TABS
5.0000 mg | ORAL_TABLET | Freq: Every day | ORAL | Status: DC
  Administered 2014-11-03: 5 mg via ORAL
  Filled 2014-11-02: qty 1

## 2014-11-02 MED ORDER — POTASSIUM CHLORIDE CRYS ER 20 MEQ PO TBCR
40.0000 meq | EXTENDED_RELEASE_TABLET | Freq: Once | ORAL | Status: AC
  Administered 2014-11-02: 40 meq via ORAL
  Filled 2014-11-02: qty 2

## 2014-11-02 MED ORDER — GI COCKTAIL ~~LOC~~
30.0000 mL | Freq: Once | ORAL | Status: AC
  Administered 2014-11-02: 30 mL via ORAL
  Filled 2014-11-02: qty 30

## 2014-11-02 MED ORDER — LABETALOL HCL 5 MG/ML IV SOLN
5.0000 mg | INTRAVENOUS | Status: DC | PRN
  Administered 2014-11-02: 5 mg via INTRAVENOUS
  Filled 2014-11-02: qty 4

## 2014-11-02 MED ORDER — METOCLOPRAMIDE HCL 5 MG PO TABS
5.0000 mg | ORAL_TABLET | Freq: Three times a day (TID) | ORAL | Status: DC
  Administered 2014-11-02 – 2014-11-03 (×4): 5 mg via ORAL
  Filled 2014-11-02 (×4): qty 1

## 2014-11-02 MED ORDER — SODIUM CHLORIDE 0.9 % IV SOLN
INTRAVENOUS | Status: DC
  Administered 2014-11-02: 1000 mL via INTRAVENOUS

## 2014-11-02 MED ORDER — SODIUM CHLORIDE 0.9 % IV BOLUS (SEPSIS)
1000.0000 mL | Freq: Once | INTRAVENOUS | Status: AC
  Administered 2014-11-02: 1000 mL via INTRAVENOUS

## 2014-11-02 MED ORDER — PROMETHAZINE HCL 25 MG/ML IJ SOLN
12.5000 mg | Freq: Four times a day (QID) | INTRAMUSCULAR | Status: DC | PRN
  Administered 2014-11-02 – 2014-11-03 (×4): 12.5 mg via INTRAVENOUS
  Filled 2014-11-02 (×4): qty 1

## 2014-11-02 NOTE — Progress Notes (Addendum)
Patient picking and blowing nose hard started to bleed small amount into tissue.  No nausea or vomiting last night.

## 2014-11-02 NOTE — Progress Notes (Signed)
Subjective: Pt tolerated a [place] sandwich last night w/o n/v. This morning she was unable to tolerate a liquid diet that was visualized in emesis bin. Thinks she may have drank too much water. Having mid epigastric pain that radiates around her abdomen b/l. Denies feeling bloated.   Objective: Vital signs in last 24 hours: Filed Vitals:   11/01/14 1728 11/01/14 2042 11/02/14 0200 11/02/14 0602  BP: 189/99 156/90 138/83 154/89  Pulse: 98 102 100 92  Temp: 98.8 F (37.1 C) 98.7 F (37.1 C) 99.1 F (37.3 C) 98.7 F (37.1 C)  TempSrc: Oral Oral Oral Oral  Resp: 20 18 18 19   Height:      Weight:      SpO2: 100% 100% 100% 99%   General: nauseated, moving back in forth in bed Lungs: CTAB, no wheezing Cardiac: RRR, no murmurs GI: non tender to palpation of midepigastric region which is new,  active bowel sounds, soft Neuro: CN II-XII grossly intact  Lab Results: Basic Metabolic Panel:  Recent Labs Lab 11/01/14 0730 11/02/14 0610  NA 135 139  K 3.5 3.0*  CL 104 111  CO2 20 22  GLUCOSE 175* 117*  BUN 22 14  CREATININE 1.45* 1.38*  CALCIUM 8.7 8.1*   Liver Function Tests:  Recent Labs Lab 10/30/14 2146  AST 21  ALT 13  ALKPHOS 87  BILITOT 0.8  PROT 7.5  ALBUMIN 3.0*    Recent Labs Lab 10/30/14 2146  LIPASE 31   CBC:  Recent Labs Lab 10/30/14 2146  WBC 10.5  NEUTROABS 9.6*  HGB 12.8  HCT 36.4  MCV 83.7  PLT 361   Urine Drug Screen: Drugs of Abuse     Component Value Date/Time   LABOPIA NONE DETECTED 10/30/2014 2349   COCAINSCRNUR NONE DETECTED 10/30/2014 2349   LABBENZ NONE DETECTED 10/30/2014 2349   AMPHETMU NONE DETECTED 10/30/2014 2349   THCU POSITIVE* 10/30/2014 2349   LABBARB NONE DETECTED 10/30/2014 2349    Urinalysis:  Recent Labs Lab 10/30/14 2349  COLORURINE YELLOW  LABSPEC 1.030  PHURINE 6.0  GLUCOSEU >1000*  HGBUR LARGE*  BILIRUBINUR NEGATIVE  KETONESUR 40*  PROTEINUR >300*  UROBILINOGEN 0.2  NITRITE NEGATIVE    LEUKOCYTESUR NEGATIVE   Medications: I have reviewed the patient's current medications. Scheduled Meds: . buPROPion  150 mg Oral Daily  . enoxaparin (LOVENOX) injection  40 mg Subcutaneous Q24H  . famotidine  20 mg Oral BID  . gi cocktail  30 mL Oral Once  . insulin aspart  0-15 Units Subcutaneous 6 times per day  . metoCLOPramide  5 mg Oral TID AC  . pantoprazole (PROTONIX) IV  40 mg Intravenous Daily  . sodium chloride  1,000 mL Intravenous Once  . traZODone  100 mg Oral QHS   Continuous Infusions: . sodium chloride 125 mL/hr at 11/01/14 0523   PRN Meds:.neomycin-bacitracin-polymyxin, promethazine Assessment/Plan: Principal Problem:   Intractable cyclical vomiting Active Problems:   Diabetes mellitus type II, uncontrolled   Hypertension   Intractable nausea and vomiting   Microscopic hematuria   Marijuana abuse   Intractable cyclical nausea/vomiting: pt tolerates dinner but then starts having n/v the following morning which has been consistent since admission.  Also drinking ginger ale makes pt vomit.  - Pepcid 20 mg twice daily - IV protonix 40mg  daily - Phenergan25 mg po every 6 hours as needed for nausea - gastric emptying study and further GI workup as outpatient - advance diet as tolerated - GI cocktail for pain  this morning - started reglan 5mg  TID   Acute kidney injury: Cr trending down to 1.38 from 1.45 yesterday  - stopped lisinopril yesterday - NS 1L bolus over 2 hours - recheck BMET at 12:00 today  HTN:. She takes lisinopril 20 mg. Pt had elevated BP up to 189/99 likely due to vomiting and abdominal pain. BP 154/89 this morning, will cont to monitor -stopped lisinopril due to AKI  Poorly controlled Type 2 DM: A1c 10.0, 11/13/2013. Home meds include metformin 500 mg -A1c 9.9 - sliding scale insulin  Depression: Home meds include Wellbutrin 150 mg, trazodone 100 mg -Continue Wellbutrin -Continue trazodone  #FEN:  -Diet: liquid diet,  advance diet as  tolerated  #DVT prophylaxis: Lovenox  #CODE STATUS: FULL CODE   Dispo: Disposition is deferred at this time, awaiting improvement of current medical problems.   The patient does have a current PCP (Rushil Sherrye Payor, MD) and does need an [facility] hospital follow-up appointment after discharge.  The patient does not know have transportation limitations that hinder transportation to clinic appointments  .Services Needed at time of discharge: Y = Yes, Blank = No PT:   OT:   RN:   Equipment:   Other:     LOS: 1 day   Norman Herrlich, MD 11/02/2014, 9:15 AM

## 2014-11-03 LAB — BASIC METABOLIC PANEL
Anion gap: 7 (ref 5–15)
BUN: 10 mg/dL (ref 6–23)
CO2: 21 mmol/L (ref 19–32)
Calcium: 8.3 mg/dL — ABNORMAL LOW (ref 8.4–10.5)
Chloride: 110 mmol/L (ref 96–112)
Creatinine, Ser: 1.16 mg/dL — ABNORMAL HIGH (ref 0.50–1.10)
GFR calc Af Amer: 68 mL/min — ABNORMAL LOW (ref >90)
GFR calc non Af Amer: 59 mL/min — ABNORMAL LOW (ref >90)
Glucose, Bld: 166 mg/dL — ABNORMAL HIGH (ref 70–99)
Potassium: 3.1 mmol/L — ABNORMAL LOW (ref 3.5–5.1)
Sodium: 138 mmol/L (ref 135–145)

## 2014-11-03 LAB — GLUCOSE, CAPILLARY
Glucose-Capillary: 109 mg/dL — ABNORMAL HIGH (ref 70–99)
Glucose-Capillary: 178 mg/dL — ABNORMAL HIGH (ref 70–99)
Glucose-Capillary: 184 mg/dL — ABNORMAL HIGH (ref 70–99)

## 2014-11-03 MED ORDER — POTASSIUM CHLORIDE CRYS ER 20 MEQ PO TBCR
40.0000 meq | EXTENDED_RELEASE_TABLET | Freq: Once | ORAL | Status: AC
  Administered 2014-11-03: 40 meq via ORAL
  Filled 2014-11-03: qty 2

## 2014-11-03 MED ORDER — GI COCKTAIL ~~LOC~~
30.0000 mL | Freq: Once | ORAL | Status: AC
  Administered 2014-11-03: 30 mL via ORAL
  Filled 2014-11-03: qty 30

## 2014-11-03 MED ORDER — PANTOPRAZOLE SODIUM 40 MG PO TBEC
40.0000 mg | DELAYED_RELEASE_TABLET | Freq: Every day | ORAL | Status: DC
  Administered 2014-11-03: 40 mg via ORAL
  Filled 2014-11-03: qty 1

## 2014-11-03 NOTE — Progress Notes (Signed)
Subjective: Pt was sitting in bed this morning and reported she had not vomited overnight. She did feel nauseous this morning. She tolerated her dinner of vegetables last night and as of seeing her was only nauseous after her breakfast of eggs and sausage this morning. She reported that she still had tenderness in her epigastric region but not pain. Patient stated midepigastric pain was only 3/10 this morning, an improvement from yesterday.  Objective: Vital signs in last 24 hours: Filed Vitals:   11/02/14 1927 11/02/14 2026 11/03/14 0122 11/03/14 0503  BP: 180/89 171/76 174/99 158/78  Pulse:  100 103 99  Temp:  99 F (37.2 C) 99.1 F (37.3 C) 98.5 F (36.9 C)  TempSrc:  Oral Oral Oral  Resp:  18 17 18   Height:      Weight:      SpO2:  100% 100% 100%   BP 158/78 mmHg  Pulse 99  Temp(Src) 98.5 F (36.9 C) (Oral)  Resp 18  Ht 5\' 5"  (1.651 m)  Wt 77.111 kg (170 lb)  BMI 28.29 kg/m2  SpO2 100%  LMP 10/12/2014 (Exact Date) General appearance: alert and sitting up rocking in bed in mild pain Lungs: clear to auscultation bilaterally and no wheezes, rales or ronchi Heart: regular rate and rhythm, S1, S2 normal and no murmur, rubs or gallops Abdomen: NABS; soft, tender to palpitation on midepigastric region; no guarding on palpitation which shows improvement    Lab Results: BMP Latest Ref Rng 11/03/2014 11/02/2014 11/02/2014  Glucose 70 - 99 mg/dL 166(H) 204(H) 117(H)  BUN 6 - 23 mg/dL 10 16 14   Creatinine 0.50 - 1.10 mg/dL 1.16(H) 1.24(H) 1.38(H)  Sodium 135 - 145 mmol/L 138 137 139  Potassium 3.5 - 5.1 mmol/L 3.1(L) 3.8 3.0(L)  Chloride 96 - 112 mmol/L 110 107 111  CO2 19 - 32 mmol/L 21 19 22   Calcium 8.4 - 10.5 mg/dL 8.3(L) 8.3(L) 8.1(L)   Glucose: 0750 11/03/2014: 178; range 109-324 during admission  Studies/Results: No results found. Medications: I have reviewed the patient's current medications. Scheduled Meds: . amLODipine  5 mg Oral Daily  . buPROPion  150 mg Oral  Daily  . enoxaparin (LOVENOX) injection  40 mg Subcutaneous Q24H  . famotidine  20 mg Oral BID  . insulin aspart  0-15 Units Subcutaneous 6 times per day  . metoCLOPramide  5 mg Oral TID AC  . pantoprazole (PROTONIX) IV  40 mg Intravenous Daily  . potassium chloride  40 mEq Oral Once  . traZODone  100 mg Oral QHS   Continuous Infusions: . sodium chloride 1,000 mL (11/02/14 2221)   PRN Meds:.labetalol, neomycin-bacitracin-polymyxin, promethazine, promethazine Assessment/Plan: Principal Problem:   Intractable cyclical vomiting Active Problems:   Diabetes mellitus type II, uncontrolled   Hypertension   Intractable nausea and vomiting   Microscopic hematuria   Marijuana abuse   Esophageal reflux  Kathy Frank is a 39yo female with a history of gastroparesis, GERD, Type II DM, and marijuana abuse who presented with a one day history of nausea and vomiting.  Intractable cyclic vomiting: The pt did not vomit overnight and was able to tolerate dinner and breakfast this morning with only mild nausea. Midepigastric pain has decreased to tenderness. Pt started on Reglan yesterday which seems to have helped with her vomiting. Transition to outpatient medicines as patient being considered for discharge today. - Continue Pepcid BID - Continue Reglan started on 11/02/2014 - Consider outpatient gastric emptying study on discharge when patient's complaints resolve - Continue  Phenergan PRN - Restart Glucophage on discharge - Disontinue NS 125cc/hr on discharge   Type 2 diabetes mellitus: The patient's has historically poor glycemic control as her HBA1Cs have been elevated ranging from 9.4-11.4 over several years. Pt started on metoclopromide for potential diabetic gastroparesis.  - Discontinue novolog sliding scale - Continue Reglan  HTN: Patient's BP was 158/78 this morning. Had an elevated BP of 208/93 in the last 24 hours. Started on labetalol if HTN >180/110. She is on outpatient lisinopril 20mg   daily.   - Continue lisinopril  - Discontinue labetalol PRN on discharge  ?AKI:  Serum creatinine was elevated on 11/01/2014 at 1.45. Has trended down to 1.16 today.   FEN: Hypokalemia on BMET this morning. Down to 3.1 from 3.8 yesterday. The drop could be due to the fluid she's received and lack of po intake. Tolerated solid foods last night and this AM. - Continue to advance diet as tolerated - Start KCl for hypokalemia  Depression: Patient treated for depression as outpatient. - Continue wellbutrin  - Continue desyrel   DVT prophylaxis: Lovenox  Code status: full code  Dispo: Consider discharge if able to tolerate diet. On discharge resume home medications.   This is a Careers information officer Note.  The care of the patient was discussed with Dr. Hulen Luster and the assessment and plan formulated with their assistance.  Please see their attached note for official documentation of the daily encounter.   LOS: 2 days   Gery Pray, Med Student 11/03/2014, 9:02 AM

## 2014-11-03 NOTE — Progress Notes (Signed)
Subjective: Pt feels better this morning. She was able to tolerate regular diet for dinner last night. Still having nausea that is controlled with phenergan. No longer having abdominal pain.  Objective: Vital signs in last 24 hours: Filed Vitals:   11/02/14 1927 11/02/14 2026 11/03/14 0122 11/03/14 0503  BP: 180/89 171/76 174/99 158/78  Pulse:  100 103 99  Temp:  99 F (37.2 C) 99.1 F (37.3 C) 98.5 F (36.9 C)  TempSrc:  Oral Oral Oral  Resp:  18 17 18   Height:      Weight:      SpO2:  100% 100% 100%   General: NAD, laying in bed comfortably Lungs: CTAB, no wheezing Cardiac: RRR, no murmurs GI: non tender to palpation,  active bowel sounds, soft Neuro: CN II-XII grossly intact  Lab Results: Basic Metabolic Panel:  Recent Labs Lab 11/02/14 0610 11/02/14 1320  NA 139 137  K 3.0* 3.8  CL 111 107  CO2 22 19  GLUCOSE 117* 204*  BUN 14 16  CREATININE 1.38* 1.24*  CALCIUM 8.1* 8.3*   Liver Function Tests:  Recent Labs Lab 10/30/14 2146  AST 21  ALT 13  ALKPHOS 87  BILITOT 0.8  PROT 7.5  ALBUMIN 3.0*    Recent Labs Lab 10/30/14 2146  LIPASE 31   CBC:  Recent Labs Lab 10/30/14 2146  WBC 10.5  NEUTROABS 9.6*  HGB 12.8  HCT 36.4  MCV 83.7  PLT 361   Urine Drug Screen: Drugs of Abuse     Component Value Date/Time   LABOPIA NONE DETECTED 10/30/2014 2349   COCAINSCRNUR NONE DETECTED 10/30/2014 2349   LABBENZ NONE DETECTED 10/30/2014 2349   AMPHETMU NONE DETECTED 10/30/2014 2349   THCU POSITIVE* 10/30/2014 2349   LABBARB NONE DETECTED 10/30/2014 2349    Urinalysis:  Recent Labs Lab 10/30/14 2349  COLORURINE YELLOW  LABSPEC 1.030  PHURINE 6.0  GLUCOSEU >1000*  HGBUR LARGE*  BILIRUBINUR NEGATIVE  KETONESUR 40*  PROTEINUR >300*  UROBILINOGEN 0.2  NITRITE NEGATIVE  LEUKOCYTESUR NEGATIVE   Medications: I have reviewed the patient's current medications. Scheduled Meds: . amLODipine  5 mg Oral Daily  . buPROPion  150 mg Oral Daily    . enoxaparin (LOVENOX) injection  40 mg Subcutaneous Q24H  . famotidine  20 mg Oral BID  . insulin aspart  0-15 Units Subcutaneous 6 times per day  . metoCLOPramide  5 mg Oral TID AC  . pantoprazole (PROTONIX) IV  40 mg Intravenous Daily  . traZODone  100 mg Oral QHS   Continuous Infusions: . sodium chloride 1,000 mL (11/02/14 2221)   PRN Meds:.labetalol, neomycin-bacitracin-polymyxin, promethazine, promethazine Assessment/Plan: Principal Problem:   Intractable cyclical vomiting Active Problems:   Diabetes mellitus type II, uncontrolled   Hypertension   Intractable nausea and vomiting   Microscopic hematuria   Marijuana abuse   Esophageal reflux   Intractable cyclical nausea/vomiting:improved, no longer having abd pain or vomiting. Nausea controlled w/ phenergan.  - Pepcid 20 mg twice daily - IV protonix 40mg  daily changed to PO - Phenergan25 mg po every 6 hours as needed for nausea - gastric emptying study and further GI workup as outpatient - reglan 5mg  TID  - if tolerating lunch can be discharged home today  Acute kidney injury: resolving. Cr trended down to 1.16 this morning from 1.24 yesterday.  - holding home lisinopril, will resume on d/c  HTN:. She takes lisinopril 20 mg. -stopped lisinopril due to AKI, elevated BPs overnight likely 2/2  pain and n/v. Was given norvasc 5mg  by overnight team for elevated BP.  - will resume lisinopril on d/c  Poorly controlled Type 2 DM: A1c 10.0, 11/13/2013. Home meds include metformin 500 mg -A1c 9.9 - sliding scale insulin  Depression: Home meds include Wellbutrin 150 mg, trazodone 100 mg -Continue Wellbutrin -Continue trazodone  #FEN:  -Diet: reg diet  #DVT prophylaxis: Lovenox  #CODE STATUS: FULL CODE   Dispo: Discharge home today   The patient does have a current PCP (Rushil Sherrye Payor, MD) and does need an [facility] hospital follow-up appointment after discharge.  The patient does not know have transportation limitations  that hinder transportation to clinic appointments  .Services Needed at time of discharge: Y = Yes, Blank = No PT:   OT:   RN:   Equipment:   Other:     LOS: 2 days   Norman Herrlich, MD 11/03/2014, 8:00 AM

## 2014-11-03 NOTE — Discharge Instructions (Signed)

## 2014-11-03 NOTE — Discharge Summary (Signed)
Name: Kathy Frank MRN: [id number] DOB: [birth year] 39 y.o. PCP: Riccardo Dubin, MD  Date of Admission: 10/30/2014 10:19 PM Date of Discharge: 11/03/2014 Attending Physician: Madilyn Fireman, MD  Discharge Diagnosis: Principal Problem:   Intractable cyclical vomiting Active Problems:   Diabetes mellitus type II, uncontrolled   Hypertension   Intractable nausea and vomiting   Microscopic hematuria   Marijuana abuse   Esophageal reflux  Discharge Medications:   Medication List    TAKE these medications        buPROPion 150 MG 24 hr tablet  Commonly known as:  WELLBUTRIN XL  Take 1 tablet (150 mg total) by mouth daily.     famotidine 20 MG tablet  Commonly known as:  PEPCID  Take 1 tablet (20 mg total) by mouth 2 (two) times daily.     lisinopril 20 MG tablet  Commonly known as:  PRINIVIL,ZESTRIL  Take 1 tablet (20 mg total) by mouth daily.     metFORMIN 500 MG tablet  Commonly known as:  GLUCOPHAGE  Take 2 tablets (1,000 mg total) by mouth 2 (two) times daily with a meal.     metoCLOPramide 5 MG tablet  Commonly known as:  REGLAN  Take 1 tablet (5 mg total) by mouth 3 (three) times daily before meals.     ondansetron 4 MG disintegrating tablet  Commonly known as:  ZOFRAN ODT  Take 1 tablet (4 mg total) by mouth every 8 (eight) hours as needed for nausea or vomiting.     ondansetron 4 MG tablet  Commonly known as:  ZOFRAN  Take 1 tablet (4 mg total) by mouth every 6 (six) hours.     oxyCODONE-acetaminophen 5-325 MG per tablet  Commonly known as:  PERCOCET  Take 1-2 tablets by mouth every 4 (four) hours as needed.     pantoprazole 40 MG tablet  Commonly known as:  PROTONIX  Take 1 tablet (40 mg total) by mouth daily.     promethazine 25 MG tablet  Commonly known as:  PHENERGAN  Take 1 tablet (25 mg total) by mouth every 6 (six) hours as needed for nausea or vomiting.     traZODone 100 MG tablet  Commonly known as:  DESYREL  Take 1 tablet (100 mg total)  by mouth at bedtime.        Disposition and follow-up:   Kathy Frank was discharged from [facility] in Stable condition.  At the hospital follow up visit please address:  1.  Please ensure compliance w/ reglan 5mg  TID. Also consider further GI workup for n/v such as gastric emptying study.  2.  Labs / imaging needed at time of follow-up: BMET  3.  Pending labs/ test needing follow-up: none  Follow-up Appointments:     Follow-up Information    Follow up with Charlott Rakes, MD.   Specialty:  Internal Medicine   Contact information:   [place] [id number] [phone]       Procedures Performed:  Dg Abd Acute W/chest  10/31/2014   CLINICAL DATA:  Acute onset of periumbilical abdominal pain and vomiting. Hematemesis. Initial encounter.  EXAM: ACUTE ABDOMEN SERIES (ABDOMEN 2 VIEW & CHEST 1 VIEW)  COMPARISON:  Chest and abdominal radiographs performed 11/13/2013  FINDINGS: The lungs are well-aerated and clear. There is no evidence of focal opacification, pleural effusion or pneumothorax. The cardiomediastinal silhouette is within normal limits.  The visualized bowel gas pattern is unremarkable. There is  a relative paucity of bowel gas within the abdomen. There is no evidence of small bowel dilatation to suggest obstruction. No free intra-abdominal air is identified on the provided upright view.  No acute osseous abnormalities are seen; the sacroiliac joints are unremarkable in appearance.  IMPRESSION: 1. Unremarkable bowel gas pattern; no free intra-abdominal air seen. Relative paucity of bowel gas within the abdomen. 2. No acute cardiopulmonary process seen.   Electronically Signed   By: Garald Balding M.D.   On: 10/31/2014 01:41    Admission HPI: Kathy Frank is a 39 year old female with history of cyclical intractable nausea/vomiting, substance use [marijuana], hypertension, poorly controlled type 2 diabetes, depression who presents with nausea and  vomiting. She was in some distress at the time of interview.  Yesterday, she reports cycling between vomiting and sleeping. She reports her vomitus is mostly food though notes there were "flecks of red." Her last meal was 2 nights ago at [facility] and consists of salad and fish sandwich. She reports smoking 1-2 blunts of marijuana 4 days ago as well and occasional alcohol use, most recently one beer 2 nights ago; no tobacco use. She does report using BC powder when she has headaches though did not specify frequency. She also reports associated mid-epigastric pain that she describes as sharp, crampy, burning and somewhat improves with lying supine. Other than some intermittent shortness of breath that she feels occasionally, she denies chest pain, diarrhea, fever, sick contacts, missing any of her medications [though cannot report which medications she takes]. Since coming to the ED, she reports feeling a little bit better. In the ED, her pregnancy negative and UDS was positive for THC. Ab & chest XR were without acute findings. She received 1 L normal 7 bolus, Protonix 40 mg IV, Zofran 4 mg IV and 8 mg ODT, Pepcid 20 mg IV, Phenergan 12.5 mg IV, fentanyl 50 g.  Hospital Course by problem list: Principal Problem:   Intractable cyclical vomiting Active Problems:   Diabetes mellitus type II, uncontrolled   Hypertension   Intractable nausea and vomiting   Microscopic hematuria   Marijuana abuse   Esophageal reflux   Intractable cyclical nausea/vomiting: Patient was treated for cannabinoid hyperemesis syndrome vs gastroparesis on admission. Chest x-ray and abdominal x-ray series were negative for acute findings. Patient's UDS was positive for THC. Patient was started on protonix IV and given Phenergan PRN throughout her stay. She experienced several episodes of intractable vomiting while admitted. She was given GI cocktails which helped with mid epigastric pain. On 11/02/2014, she was started on Reglan for  suspected diabetic gastroparesis after cyclical nausea and vomiting did not improve. On day of d/c pt tolerated breakfast and lunch and n/v had improved. Recommend gastric emptying study and further GI workup on discharge.   Poorly controlled Type II Diabetes Mellitus: On admission the patient's blood glucose was 325 and her urinalysis showed glucose >1000. Placed on sliding scale during hospital course. Patient was found to have an HBA1C of 9.9 during admission. Her HBA1Cs have ranged from 9.4-11.4 over several years. Patient is discharged on home diabetes medications.  Acute Kidney Injury: Serum creatinine was 1.19 on admission elevated from baseline of 0.5-0.8 likely due to the n/v and decreased po intake. Her serum creatinine trended down to 1.16 on discharge. Lisinopril was resumed on discharge.   Hypertension: Patient had a blood pressure of 159/51 on admission. The patient was initially treated with her outpatient medication of Lisinopril. She had episodes of hypertension throughout the stay  likely due to the stress of acute illness. At one point she became hypertensive to 203/93 and to avoid exacerbating her AKI, she was started on labetalol PRN and Norvasc 5mg  while her Lisinopril was held. Patient is discharged on home Lisinopril.    Discharge Vitals:   BP 146/98 mmHg  Pulse 96  Temp(Src) 98.6 F (37 C) (Oral)  Resp 18  Ht 5\' 5"  (1.651 m)  Wt 170 lb (77.111 kg)  BMI 28.29 kg/m2  SpO2 96%  LMP 10/12/2014 (Exact Date)  Discharge Labs:  Results for orders placed or performed during the hospital encounter of 10/30/14 (from the past 24 hour(s))  Basic metabolic panel     Status: Abnormal   Collection Time: 11/02/14  1:20 PM  Result Value Ref Range   Sodium 137 135 - 145 mmol/L   Potassium 3.8 3.5 - 5.1 mmol/L   Chloride 107 96 - 112 mmol/L   CO2 19 19 - 32 mmol/L   Glucose, Bld 204 (H) 70 - 99 mg/dL   BUN 16 6 - 23 mg/dL   Creatinine, Ser 1.24 (H) 0.50 - 1.10 mg/dL   Calcium 8.3  (L) 8.4 - 10.5 mg/dL   GFR calc non Af Amer 54 (L) >90 mL/min   GFR calc Af Amer 63 (L) >90 mL/min   Anion gap 11 5 - 15  Glucose, capillary     Status: Abnormal   Collection Time: 11/02/14  4:06 PM  Result Value Ref Range   Glucose-Capillary 173 (H) 70 - 99 mg/dL  Glucose, capillary     Status: Abnormal   Collection Time: 11/02/14  8:24 PM  Result Value Ref Range   Glucose-Capillary 152 (H) 70 - 99 mg/dL  Glucose, capillary     Status: Abnormal   Collection Time: 11/02/14 11:55 PM  Result Value Ref Range   Glucose-Capillary 109 (H) 70 - 99 mg/dL  Basic metabolic panel     Status: Abnormal   Collection Time: 11/03/14  6:57 AM  Result Value Ref Range   Sodium 138 135 - 145 mmol/L   Potassium 3.1 (L) 3.5 - 5.1 mmol/L   Chloride 110 96 - 112 mmol/L   CO2 21 19 - 32 mmol/L   Glucose, Bld 166 (H) 70 - 99 mg/dL   BUN 10 6 - 23 mg/dL   Creatinine, Ser 1.16 (H) 0.50 - 1.10 mg/dL   Calcium 8.3 (L) 8.4 - 10.5 mg/dL   GFR calc non Af Amer 59 (L) >90 mL/min   GFR calc Af Amer 68 (L) >90 mL/min   Anion gap 7 5 - 15  Glucose, capillary     Status: Abnormal   Collection Time: 11/03/14  7:50 AM  Result Value Ref Range   Glucose-Capillary 178 (H) 70 - 99 mg/dL  Glucose, capillary     Status: Abnormal   Collection Time: 11/03/14 11:10 AM  Result Value Ref Range   Glucose-Capillary 184 (H) 70 - 99 mg/dL    Signed: Norman Herrlich, MD 11/03/2014, 1:02 PM    Services Ordered on Discharge: none Equipment Ordered on Discharge: none

## 2014-11-07 ENCOUNTER — Emergency Department (HOSPITAL_COMMUNITY)
Admission: EM | Admit: 2014-11-07 | Discharge: 2014-11-08 | Disposition: A | Discharge status: Home / Self Care | Payer: BLUE CROSS/BLUE SHIELD | Attending: Emergency Medicine | Admitting: Emergency Medicine

## 2014-11-07 ENCOUNTER — Encounter (HOSPITAL_COMMUNITY): Payer: Self-pay | Admitting: Emergency Medicine

## 2014-11-07 DIAGNOSIS — Z79899 Other long term (current) drug therapy: Secondary | ICD-10-CM | POA: Insufficient documentation

## 2014-11-07 DIAGNOSIS — R1110 Vomiting, unspecified: Secondary | ICD-10-CM

## 2014-11-07 DIAGNOSIS — I10 Essential (primary) hypertension: Secondary | ICD-10-CM | POA: Diagnosis not present

## 2014-11-07 DIAGNOSIS — Z87891 Personal history of nicotine dependence: Secondary | ICD-10-CM | POA: Diagnosis not present

## 2014-11-07 DIAGNOSIS — Z8669 Personal history of other diseases of the nervous system and sense organs: Secondary | ICD-10-CM | POA: Insufficient documentation

## 2014-11-07 DIAGNOSIS — R112 Nausea with vomiting, unspecified: Secondary | ICD-10-CM | POA: Diagnosis not present

## 2014-11-07 DIAGNOSIS — Z9851 Tubal ligation status: Secondary | ICD-10-CM | POA: Insufficient documentation

## 2014-11-07 DIAGNOSIS — F329 Major depressive disorder, single episode, unspecified: Secondary | ICD-10-CM | POA: Diagnosis not present

## 2014-11-07 DIAGNOSIS — Z3202 Encounter for pregnancy test, result negative: Secondary | ICD-10-CM | POA: Insufficient documentation

## 2014-11-07 DIAGNOSIS — E119 Type 2 diabetes mellitus without complications: Secondary | ICD-10-CM | POA: Insufficient documentation

## 2014-11-07 DIAGNOSIS — R1013 Epigastric pain: Secondary | ICD-10-CM | POA: Insufficient documentation

## 2014-11-07 DIAGNOSIS — Z8719 Personal history of other diseases of the digestive system: Secondary | ICD-10-CM | POA: Insufficient documentation

## 2014-11-07 LAB — CBC WITH DIFFERENTIAL/PLATELET
Basophils Absolute: 0 10*3/uL (ref 0.0–0.1)
Basophils Relative: 1 % (ref 0–1)
Eosinophils Absolute: 0.1 10*3/uL (ref 0.0–0.7)
Eosinophils Relative: 1 % (ref 0–5)
HCT: 34.7 % — ABNORMAL LOW (ref 36.0–46.0)
Hemoglobin: 12.2 g/dL (ref 12.0–15.0)
Lymphocytes Relative: 20 % (ref 12–46)
Lymphs Abs: 1.4 10*3/uL (ref 0.7–4.0)
MCH: 30.3 pg (ref 26.0–34.0)
MCHC: 35.2 g/dL (ref 30.0–36.0)
MCV: 86.1 fL (ref 78.0–100.0)
Monocytes Absolute: 0.6 10*3/uL (ref 0.1–1.0)
Monocytes Relative: 9 % (ref 3–12)
Neutro Abs: 4.8 10*3/uL (ref 1.7–7.7)
Neutrophils Relative %: 69 % (ref 43–77)
Platelets: 390 10*3/uL (ref 150–400)
RBC: 4.03 MIL/uL (ref 3.87–5.11)
RDW: 13.2 % (ref 11.5–15.5)
WBC: 6.9 10*3/uL (ref 4.0–10.5)

## 2014-11-07 LAB — COMPREHENSIVE METABOLIC PANEL
ALT: 12 U/L (ref 0–35)
AST: 16 U/L (ref 0–37)
Albumin: 3.2 g/dL — ABNORMAL LOW (ref 3.5–5.2)
Alkaline Phosphatase: 75 U/L (ref 39–117)
Anion gap: 13 (ref 5–15)
BUN: 17 mg/dL (ref 6–23)
CO2: 27 mmol/L (ref 19–32)
Calcium: 8.9 mg/dL (ref 8.4–10.5)
Chloride: 95 mmol/L — ABNORMAL LOW (ref 96–112)
Creatinine, Ser: 1.49 mg/dL — ABNORMAL HIGH (ref 0.50–1.10)
GFR calc Af Amer: 50 mL/min — ABNORMAL LOW (ref >90)
GFR calc non Af Amer: 44 mL/min — ABNORMAL LOW (ref >90)
Glucose, Bld: 327 mg/dL — ABNORMAL HIGH (ref 70–99)
Potassium: 2.7 mmol/L — CL (ref 3.5–5.1)
Sodium: 135 mmol/L (ref 135–145)
Total Bilirubin: 0.5 mg/dL (ref 0.3–1.2)
Total Protein: 7.5 g/dL (ref 6.0–8.3)

## 2014-11-07 LAB — LIPASE, BLOOD: Lipase: 26 U/L (ref 11–59)

## 2014-11-07 LAB — POC URINE PREG, ED: Preg Test, Ur: NEGATIVE

## 2014-11-07 LAB — CBG MONITORING, ED: Glucose-Capillary: 275 mg/dL — ABNORMAL HIGH (ref 70–99)

## 2014-11-07 MED ORDER — SODIUM CHLORIDE 0.9 % IV BOLUS (SEPSIS)
1000.0000 mL | Freq: Once | INTRAVENOUS | Status: AC
  Administered 2014-11-07: 1000 mL via INTRAVENOUS

## 2014-11-07 MED ORDER — HYDROMORPHONE HCL 1 MG/ML IJ SOLN
1.0000 mg | Freq: Once | INTRAMUSCULAR | Status: AC
  Administered 2014-11-07: 1 mg via INTRAVENOUS
  Filled 2014-11-07: qty 1

## 2014-11-07 MED ORDER — POTASSIUM CHLORIDE CRYS ER 20 MEQ PO TBCR
40.0000 meq | EXTENDED_RELEASE_TABLET | Freq: Once | ORAL | Status: AC
  Administered 2014-11-07: 40 meq via ORAL
  Filled 2014-11-07: qty 2

## 2014-11-07 MED ORDER — METOCLOPRAMIDE HCL 5 MG/ML IJ SOLN
10.0000 mg | Freq: Once | INTRAMUSCULAR | Status: AC
  Administered 2014-11-07: 10 mg via INTRAVENOUS
  Filled 2014-11-07: qty 2

## 2014-11-07 MED ORDER — ONDANSETRON HCL 4 MG/2ML IJ SOLN
4.0000 mg | Freq: Once | INTRAMUSCULAR | Status: AC
  Administered 2014-11-07: 4 mg via INTRAVENOUS
  Filled 2014-11-07: qty 2

## 2014-11-07 NOTE — ED Notes (Signed)
Pt states that she was discharged from [facility] on Saturday for "same thing".  Pt states that she was fine on Sunday but Monday started having abd pain n/v and been going on since.  Pt denies diarrhea.

## 2014-11-07 NOTE — ED Notes (Signed)
Pt ambulated to the restroom to void.

## 2014-11-07 NOTE — ED Notes (Signed)
Pt had episode of emesis. EDP made aware of same and elevated BP.

## 2014-11-07 NOTE — ED Provider Notes (Signed)
CSN: [id number]     Arrival date & time 11/07/14  1524 History   First MD Initiated Contact with Patient 11/07/14 1538     Chief Complaint  Patient presents with  . Abdominal Pain  . Emesis     (Consider location/radiation/quality/duration/timing/severity/associated sxs/prior Treatment) Patient is a 39 y.o. female presenting with abdominal pain and vomiting.  Abdominal Pain Pain location:  Epigastric Pain quality: aching   Pain radiates to:  Does not radiate Pain severity:  Severe Onset quality:  Gradual Duration:  3 days Timing:  Constant Progression:  Worsening Chronicity:  Recurrent (recent hospitalization for similar symptoms) Context: retching   Relieved by:  Nothing Worsened by:  Movement Ineffective treatments: reglan. Associated symptoms: nausea and vomiting   Associated symptoms: no constipation, no diarrhea, no dysuria, no fever and no hematuria   Emesis Associated symptoms: abdominal pain   Associated symptoms: no diarrhea     Past Medical History  Diagnosis Date  . Depression   . Type II diabetes mellitus   . Hypertension   . Gastroparesis   . Cyclical vomiting     THC induced   Past Surgical History  Procedure Laterality Date  . Tubal ligation  2002   No family history on file. History  Substance Use Topics  . Smoking status: Former Smoker -- 0.10 packs/day for 10 years    Types: Cigarettes    Start date: 11/17/2013  . Smokeless tobacco: Never Used  . Alcohol Use: Yes     Comment: 11/13/2013 "occasionally; once or twice/month"   OB History    No data available     Review of Systems  Constitutional: Negative for fever.  Gastrointestinal: Positive for nausea, vomiting and abdominal pain. Negative for diarrhea and constipation.  Genitourinary: Negative for dysuria and hematuria.  All other systems reviewed and are negative.     Allergies  Review of patient's allergies indicates no known allergies.  Home Medications   Prior to Admission  medications   Medication Sig Start Date End Date Taking? Authorizing Provider  bismuth subsalicylate (PEPTO BISMOL) 262 MG chewable tablet Chew 524 mg by mouth daily as needed for indigestion (indigestion).   Yes Historical Provider, MD  lisinopril (PRINIVIL,ZESTRIL) 20 MG tablet Take 1 tablet (20 mg total) by mouth daily. 11/16/13  Yes Otho Bellows, MD  metFORMIN (GLUCOPHAGE) 500 MG tablet Take 2 tablets (1,000 mg total) by mouth 2 (two) times daily with a meal. 11/24/13 11/24/14 Yes Cresenciano Genre, MD  promethazine (PHENERGAN) 25 MG tablet Take 1 tablet (25 mg total) by mouth every 6 (six) hours as needed for nausea or vomiting. 10/10/14  Yes Margarita Mail, PA-C  buPROPion (WELLBUTRIN XL) 150 MG 24 hr tablet Take 1 tablet (150 mg total) by mouth daily. 11/24/13   Cresenciano Genre, MD  famotidine (PEPCID) 20 MG tablet Take 1 tablet (20 mg total) by mouth 2 (two) times daily. 10/10/14   Margarita Mail, PA-C  metoCLOPramide (REGLAN) 5 MG tablet Take 1 tablet (5 mg total) by mouth 3 (three) times daily before meals. Patient not taking: Reported on 10/30/2014 12/21/13   Ricke Hey, MD  ondansetron (ZOFRAN ODT) 4 MG disintegrating tablet Take 1 tablet (4 mg total) by mouth every 8 (eight) hours as needed for nausea or vomiting. Patient not taking: Reported on 10/30/2014 12/21/13   Ricke Hey, MD  ondansetron (ZOFRAN) 4 MG tablet Take 1 tablet (4 mg total) by mouth every 6 (six) hours. Patient not taking: Reported on  10/10/2014 08/26/14   Blanchie Dessert, MD  oxyCODONE-acetaminophen (PERCOCET) 5-325 MG per tablet Take 1-2 tablets by mouth every 4 (four) hours as needed. Patient not taking: Reported on 11/07/2014 10/10/14   Margarita Mail, PA-C  pantoprazole (PROTONIX) 40 MG tablet Take 1 tablet (40 mg total) by mouth daily. Patient not taking: Reported on 10/30/2014 12/21/13   Ricke Hey, MD  traZODone (DESYREL) 100 MG tablet Take 1 tablet (100 mg total) by mouth at bedtime. 11/24/13    Cresenciano Genre, MD   BP 144/107 mmHg  Pulse 106  Temp(Src) 97.9 F (36.6 C) (Oral)  Resp 20  SpO2 100%  LMP 10/12/2014 (Exact Date) Physical Exam  Constitutional: She is oriented to person, place, and time. She appears well-developed and well-nourished. No distress.  HENT:  Head: Normocephalic and atraumatic.  Mouth/Throat: Oropharynx is clear and moist.  Eyes: Conjunctivae are normal. Pupils are equal, round, and reactive to light. No scleral icterus.  Neck: Neck supple.  Cardiovascular: Normal rate, regular rhythm, normal heart sounds and intact distal pulses.   No murmur heard. Pulmonary/Chest: Effort normal and breath sounds normal. No stridor. No respiratory distress. She has no rales.  Abdominal: Soft. Bowel sounds are normal. She exhibits no distension. There is tenderness in the epigastric area. There is no rigidity, no rebound and no guarding.  Musculoskeletal: Normal range of motion.  Neurological: She is alert and oriented to person, place, and time.  Skin: Skin is warm and dry. No rash noted.  Psychiatric: She has a normal mood and affect. Her behavior is normal.  Nursing note and vitals reviewed.   ED Course  Procedures (including critical care time) Labs Review Labs Reviewed  CBC WITH DIFFERENTIAL/PLATELET - Abnormal; Notable for the following:    HCT 34.7 (*)    All other components within normal limits  COMPREHENSIVE METABOLIC PANEL - Abnormal; Notable for the following:    Potassium 2.7 (*)    Chloride 95 (*)    Glucose, Bld 327 (*)    Creatinine, Ser 1.49 (*)    Albumin 3.2 (*)    GFR calc non Af Amer 44 (*)    GFR calc Af Amer 50 (*)    All other components within normal limits  CBG MONITORING, ED - Abnormal; Notable for the following:    Glucose-Capillary 275 (*)    All other components within normal limits  LIPASE, BLOOD  URINALYSIS, ROUTINE W REFLEX MICROSCOPIC  POC URINE PREG, ED    Imaging Review No results found.   EKG  Interpretation None      MDM   Final diagnoses:  Epigastric abdominal pain  Intractable vomiting with nausea, vomiting of unspecified type    39 yo female with reported history of gastroparesis who presents with nausea, vomiting, and epigastric abdominal pain.  Recurrent.  Recent hospitalization for same.  Will attempt symptom control with IV reglan, IV dilaudid, and fluids. I don't think she needs repeat imaging today based on her history and exam.  Pain and nausea improved after multiple tests medications. Remained well-appearing on multiple checks. Given potassium replacement. Discussed admission versus outpatient treatment. She was comfortable with outpatient management and close PCP follow-up. She has medications at home from prior discharge. Given return precautions.  Serita Grit, MD 11/08/14 [phone]

## 2014-11-08 ENCOUNTER — Telehealth: Payer: Self-pay | Admitting: *Deleted

## 2014-11-08 ENCOUNTER — Observation Stay (HOSPITAL_COMMUNITY)
Admission: EM | Admit: 2014-11-08 | Discharge: 2014-11-09 | Disposition: A | Discharge status: Home / Self Care | Payer: BLUE CROSS/BLUE SHIELD | Attending: Internal Medicine | Admitting: Internal Medicine

## 2014-11-08 ENCOUNTER — Encounter (HOSPITAL_COMMUNITY): Payer: Self-pay | Admitting: *Deleted

## 2014-11-08 ENCOUNTER — Other Ambulatory Visit: Payer: Self-pay | Admitting: Family Medicine

## 2014-11-08 DIAGNOSIS — R112 Nausea with vomiting, unspecified: Secondary | ICD-10-CM | POA: Diagnosis present

## 2014-11-08 DIAGNOSIS — E119 Type 2 diabetes mellitus without complications: Secondary | ICD-10-CM | POA: Diagnosis present

## 2014-11-08 DIAGNOSIS — G43A1 Cyclical vomiting, intractable: Secondary | ICD-10-CM | POA: Diagnosis not present

## 2014-11-08 DIAGNOSIS — F1210 Cannabis abuse, uncomplicated: Secondary | ICD-10-CM | POA: Diagnosis not present

## 2014-11-08 DIAGNOSIS — E1165 Type 2 diabetes mellitus with hyperglycemia: Secondary | ICD-10-CM | POA: Insufficient documentation

## 2014-11-08 DIAGNOSIS — E1143 Type 2 diabetes mellitus with diabetic autonomic (poly)neuropathy: Secondary | ICD-10-CM | POA: Insufficient documentation

## 2014-11-08 DIAGNOSIS — K3184 Gastroparesis: Secondary | ICD-10-CM | POA: Diagnosis not present

## 2014-11-08 DIAGNOSIS — F329 Major depressive disorder, single episode, unspecified: Secondary | ICD-10-CM | POA: Diagnosis not present

## 2014-11-08 DIAGNOSIS — E669 Obesity, unspecified: Secondary | ICD-10-CM | POA: Diagnosis present

## 2014-11-08 DIAGNOSIS — Z79899 Other long term (current) drug therapy: Secondary | ICD-10-CM | POA: Diagnosis not present

## 2014-11-08 DIAGNOSIS — N179 Acute kidney failure, unspecified: Secondary | ICD-10-CM | POA: Insufficient documentation

## 2014-11-08 DIAGNOSIS — R1084 Generalized abdominal pain: Secondary | ICD-10-CM

## 2014-11-08 DIAGNOSIS — I10 Essential (primary) hypertension: Secondary | ICD-10-CM | POA: Diagnosis present

## 2014-11-08 DIAGNOSIS — Z87891 Personal history of nicotine dependence: Secondary | ICD-10-CM | POA: Insufficient documentation

## 2014-11-08 DIAGNOSIS — R1013 Epigastric pain: Secondary | ICD-10-CM | POA: Diagnosis not present

## 2014-11-08 DIAGNOSIS — E876 Hypokalemia: Secondary | ICD-10-CM | POA: Insufficient documentation

## 2014-11-08 DIAGNOSIS — E1169 Type 2 diabetes mellitus with other specified complication: Secondary | ICD-10-CM | POA: Diagnosis present

## 2014-11-08 DIAGNOSIS — R1110 Vomiting, unspecified: Secondary | ICD-10-CM

## 2014-11-08 LAB — LIPASE, BLOOD: Lipase: 27 U/L (ref 11–59)

## 2014-11-08 LAB — CBC WITH DIFFERENTIAL/PLATELET
Basophils Absolute: 0 10*3/uL (ref 0.0–0.1)
Basophils Relative: 0 % (ref 0–1)
Eosinophils Absolute: 0 10*3/uL (ref 0.0–0.7)
Eosinophils Relative: 0 % (ref 0–5)
HCT: 33.4 % — ABNORMAL LOW (ref 36.0–46.0)
Hemoglobin: 11.5 g/dL — ABNORMAL LOW (ref 12.0–15.0)
Lymphocytes Relative: 14 % (ref 12–46)
Lymphs Abs: 1 10*3/uL (ref 0.7–4.0)
MCH: 29 pg (ref 26.0–34.0)
MCHC: 34.4 g/dL (ref 30.0–36.0)
MCV: 84.3 fL (ref 78.0–100.0)
Monocytes Absolute: 0.7 10*3/uL (ref 0.1–1.0)
Monocytes Relative: 10 % (ref 3–12)
Neutro Abs: 5.7 10*3/uL (ref 1.7–7.7)
Neutrophils Relative %: 76 % (ref 43–77)
Platelets: 341 10*3/uL (ref 150–400)
RBC: 3.96 MIL/uL (ref 3.87–5.11)
RDW: 12.9 % (ref 11.5–15.5)
WBC: 7.5 10*3/uL (ref 4.0–10.5)

## 2014-11-08 LAB — COMPREHENSIVE METABOLIC PANEL
ALT: 12 U/L (ref 0–35)
AST: 14 U/L (ref 0–37)
Albumin: 2.7 g/dL — ABNORMAL LOW (ref 3.5–5.2)
Alkaline Phosphatase: 68 U/L (ref 39–117)
Anion gap: 12 (ref 5–15)
BUN: 12 mg/dL (ref 6–23)
CO2: 23 mmol/L (ref 19–32)
Calcium: 8.7 mg/dL (ref 8.4–10.5)
Chloride: 101 mmol/L (ref 96–112)
Creatinine, Ser: 1.27 mg/dL — ABNORMAL HIGH (ref 0.50–1.10)
GFR calc Af Amer: 61 mL/min — ABNORMAL LOW (ref >90)
GFR calc non Af Amer: 53 mL/min — ABNORMAL LOW (ref >90)
Glucose, Bld: 326 mg/dL — ABNORMAL HIGH (ref 70–99)
Potassium: 3 mmol/L — ABNORMAL LOW (ref 3.5–5.1)
Sodium: 136 mmol/L (ref 135–145)
Total Bilirubin: 0.7 mg/dL (ref 0.3–1.2)
Total Protein: 6.7 g/dL (ref 6.0–8.3)

## 2014-11-08 LAB — URINALYSIS, ROUTINE W REFLEX MICROSCOPIC
Bilirubin Urine: NEGATIVE
Glucose, UA: >1000 mg/dL — AB
Ketones, ur: 40 mg/dL — AB
Leukocytes, UA: NEGATIVE
Nitrite: NEGATIVE
Protein, ur: >300 mg/dL — AB
Specific Gravity, Urine: 1.034 — ABNORMAL HIGH (ref 1.005–1.030)
Urobilinogen, UA: 1 mg/dL (ref 0.0–1.0)
pH: 7.5 (ref 5.0–8.0)

## 2014-11-08 LAB — RAPID URINE DRUG SCREEN, HOSP PERFORMED
Amphetamines: NOT DETECTED
Barbiturates: POSITIVE — AB
Benzodiazepines: NOT DETECTED
Cocaine: NOT DETECTED
Opiates: POSITIVE — AB
Tetrahydrocannabinol: POSITIVE — AB

## 2014-11-08 LAB — URINE MICROSCOPIC-ADD ON

## 2014-11-08 LAB — POC URINE PREG, ED: Preg Test, Ur: NEGATIVE

## 2014-11-08 LAB — GLUCOSE, CAPILLARY: Glucose-Capillary: 256 mg/dL — ABNORMAL HIGH (ref 70–99)

## 2014-11-08 MED ORDER — MORPHINE SULFATE 4 MG/ML IJ SOLN
4.0000 mg | Freq: Once | INTRAMUSCULAR | Status: AC
Start: 2014-11-08 — End: 2014-11-08
  Administered 2014-11-08: 4 mg via INTRAVENOUS
  Filled 2014-11-08: qty 1

## 2014-11-08 MED ORDER — PANTOPRAZOLE SODIUM 40 MG IV SOLR
40.0000 mg | Freq: Once | INTRAVENOUS | Status: AC
  Administered 2014-11-08: 40 mg via INTRAVENOUS
  Filled 2014-11-08: qty 40

## 2014-11-08 MED ORDER — PROMETHAZINE HCL 25 MG/ML IJ SOLN
25.0000 mg | Freq: Once | INTRAMUSCULAR | Status: AC
  Administered 2014-11-08: 25 mg via INTRAVENOUS
  Filled 2014-11-08: qty 1

## 2014-11-08 MED ORDER — HEPARIN SODIUM (PORCINE) 5000 UNIT/ML IJ SOLN
5000.0000 [IU] | Freq: Three times a day (TID) | INTRAMUSCULAR | Status: DC
  Administered 2014-11-08 – 2014-11-09 (×3): 5000 [IU] via SUBCUTANEOUS
  Filled 2014-11-08 (×6): qty 1

## 2014-11-08 MED ORDER — METOCLOPRAMIDE HCL 5 MG/ML IJ SOLN
10.0000 mg | Freq: Three times a day (TID) | INTRAMUSCULAR | Status: DC
  Administered 2014-11-08 – 2014-11-09 (×3): 10 mg via INTRAVENOUS
  Filled 2014-11-08 (×5): qty 2

## 2014-11-08 MED ORDER — PANTOPRAZOLE SODIUM 40 MG IV SOLR
40.0000 mg | INTRAVENOUS | Status: DC
  Administered 2014-11-09: 40 mg via INTRAVENOUS
  Filled 2014-11-08 (×2): qty 40

## 2014-11-08 MED ORDER — INSULIN ASPART 100 UNIT/ML ~~LOC~~ SOLN
0.0000 [IU] | Freq: Three times a day (TID) | SUBCUTANEOUS | Status: DC
  Administered 2014-11-09 (×2): 3 [IU] via SUBCUTANEOUS

## 2014-11-08 MED ORDER — SODIUM CHLORIDE 0.9 % IV BOLUS (SEPSIS)
1000.0000 mL | Freq: Once | INTRAVENOUS | Status: AC
  Administered 2014-11-08: 1000 mL via INTRAVENOUS

## 2014-11-08 MED ORDER — BUPROPION HCL ER (XL) 150 MG PO TB24
150.0000 mg | ORAL_TABLET | Freq: Every day | ORAL | Status: DC
  Administered 2014-11-08 – 2014-11-09 (×2): 150 mg via ORAL
  Filled 2014-11-08 (×2): qty 1

## 2014-11-08 MED ORDER — FAMOTIDINE IN NACL 20-0.9 MG/50ML-% IV SOLN
20.0000 mg | Freq: Once | INTRAVENOUS | Status: AC
  Administered 2014-11-08: 20 mg via INTRAVENOUS
  Filled 2014-11-08: qty 50

## 2014-11-08 MED ORDER — AMLODIPINE BESYLATE 10 MG PO TABS
10.0000 mg | ORAL_TABLET | Freq: Every day | ORAL | Status: DC
  Administered 2014-11-08 – 2014-11-09 (×2): 10 mg via ORAL
  Filled 2014-11-08: qty 2
  Filled 2014-11-08: qty 1

## 2014-11-08 MED ORDER — METOPROLOL TARTRATE 1 MG/ML IV SOLN
5.0000 mg | Freq: Four times a day (QID) | INTRAVENOUS | Status: DC | PRN
  Administered 2014-11-08: 5 mg via INTRAVENOUS
  Filled 2014-11-08 (×2): qty 5

## 2014-11-08 MED ORDER — POTASSIUM CHLORIDE 10 MEQ/100ML IV SOLN
10.0000 meq | INTRAVENOUS | Status: AC
  Administered 2014-11-08 (×3): 10 meq via INTRAVENOUS
  Filled 2014-11-08 (×3): qty 100

## 2014-11-08 MED ORDER — ONDANSETRON HCL 4 MG PO TABS: 4.0000 mg | ORAL_TABLET | Freq: Four times a day (QID) | ORAL | Status: DC | PRN

## 2014-11-08 MED ORDER — HYDRALAZINE HCL 20 MG/ML IJ SOLN
5.0000 mg | Freq: Once | INTRAMUSCULAR | Status: AC
  Administered 2014-11-08: 5 mg via INTRAVENOUS
  Filled 2014-11-08: qty 1

## 2014-11-08 MED ORDER — ONDANSETRON HCL 4 MG/2ML IJ SOLN
4.0000 mg | Freq: Four times a day (QID) | INTRAMUSCULAR | Status: DC | PRN
  Filled 2014-11-08 (×2): qty 2

## 2014-11-08 MED ORDER — TRAZODONE HCL 100 MG PO TABS
100.0000 mg | ORAL_TABLET | Freq: Every day | ORAL | Status: DC
  Administered 2014-11-08: 100 mg via ORAL
  Filled 2014-11-08 (×2): qty 1

## 2014-11-08 MED ORDER — LABETALOL HCL 5 MG/ML IV SOLN
5.0000 mg | Freq: Once | INTRAVENOUS | Status: AC
  Administered 2014-11-08: 5 mg via INTRAVENOUS
  Filled 2014-11-08: qty 4

## 2014-11-08 MED ORDER — METOCLOPRAMIDE HCL 5 MG/ML IJ SOLN
10.0000 mg | Freq: Once | INTRAMUSCULAR | Status: AC
  Administered 2014-11-08: 10 mg via INTRAVENOUS
  Filled 2014-11-08: qty 2

## 2014-11-08 NOTE — ED Notes (Signed)
PT was recently admitted for gastroparesis and seen at [facility] last night for same s/s.  States continued abdominal pain and emesis.

## 2014-11-08 NOTE — Telephone Encounter (Signed)
Pt calls and states she is getting worse, states she went to [facility] ED and was discharged, she has continued to have severe N&V, she is unable to eat, drink or even sip on anything at this time, meds given are not helping, she states she is getting weaker, i can here 2 episodes of emesis, retching while trying to speak with her, she is advised to have some one to bring her to [place] asap, she is agreeable

## 2014-11-08 NOTE — ED Provider Notes (Signed)
CSN: [id number]     Arrival date & time 11/08/14  1212 History   First MD Initiated Contact with Patient 11/08/14 1321     Chief Complaint  Patient presents with  . Abdominal Pain  . Emesis     (Consider location/radiation/quality/duration/timing/severity/associated sxs/prior Treatment) HPI Pt is a 39yo female with hx of NIDDM, HTN, gastroparesis and THC induced cyclical vomiting syndrome, presenting to ED with c/o diffuse abdominal pain, nausea and vomiting. Pt was admitted for same on 10/30/14, discharged 11/03/14.  Pt was seen last night at [facility] for same. Symptoms were managed in ED, admission was discussed at that time but pt decided to go home as she had medications including pepcid and reglan at home from recent admission.  Pt reports abdominal pain is constant, aching, cramping and sharp, 10/10 at worst.  Pt cannot recall number of episodes of emesis but mother states "bags and bags" since last night.  Denies urinary or vaginal symptoms. Denies fever or chills. No hx of abdominal surgeries. Pt was to have a f/u appointment with GI next week for gastric emptying study but symptoms came back since discharge home and she cannot wait until next week.   Past Medical History  Diagnosis Date  . Depression   . Type II diabetes mellitus   . Hypertension   . Gastroparesis   . Cyclical vomiting     THC induced   Past Surgical History  Procedure Laterality Date  . Tubal ligation  2002   No family history on file. History  Substance Use Topics  . Smoking status: Former Smoker -- 0.10 packs/day for 10 years    Types: Cigarettes    Start date: 11/17/2013    Quit date: 09/09/2014  . Smokeless tobacco: Never Used  . Alcohol Use: Yes     Comment: 11/13/2013 "occasionally; once or twice/month"   OB History    No data available     Review of Systems  Constitutional: Negative for fever, chills and fatigue.  Respiratory: Negative for cough and shortness of breath.   Cardiovascular: Negative  for chest pain and palpitations.  Gastrointestinal: Positive for nausea, vomiting and abdominal pain. Negative for diarrhea.  All other systems reviewed and are negative.     Allergies  Review of patient's allergies indicates no known allergies.  Home Medications   Prior to Admission medications   Medication Sig Start Date End Date Taking? Authorizing Provider  bismuth subsalicylate (PEPTO BISMOL) 262 MG chewable tablet Chew 524 mg by mouth daily as needed for indigestion (indigestion).   Yes Historical Provider, MD  buPROPion (WELLBUTRIN XL) 150 MG 24 hr tablet Take 1 tablet (150 mg total) by mouth daily. 11/24/13  Yes Cresenciano Genre, MD  famotidine (PEPCID) 20 MG tablet Take 1 tablet (20 mg total) by mouth 2 (two) times daily. 10/10/14  Yes Margarita Mail, PA-C  lisinopril (PRINIVIL,ZESTRIL) 20 MG tablet Take 1 tablet (20 mg total) by mouth daily. 11/16/13  Yes Otho Bellows, MD  metFORMIN (GLUCOPHAGE) 500 MG tablet Take 2 tablets (1,000 mg total) by mouth 2 (two) times daily with a meal. 11/24/13 11/24/14 Yes Cresenciano Genre, MD  promethazine (PHENERGAN) 25 MG tablet Take 1 tablet (25 mg total) by mouth every 6 (six) hours as needed for nausea or vomiting. 10/10/14  Yes Margarita Mail, PA-C  traZODone (DESYREL) 100 MG tablet Take 1 tablet (100 mg total) by mouth at bedtime. 11/24/13  Yes Cresenciano Genre, MD  metoCLOPramide (REGLAN) 5 MG tablet Take  1 tablet (5 mg total) by mouth 3 (three) times daily before meals. Patient not taking: Reported on 10/30/2014 12/21/13   Ricke Hey, MD  ondansetron (ZOFRAN ODT) 4 MG disintegrating tablet Take 1 tablet (4 mg total) by mouth every 8 (eight) hours as needed for nausea or vomiting. Patient not taking: Reported on 10/30/2014 12/21/13   Ricke Hey, MD  ondansetron (ZOFRAN) 4 MG tablet Take 1 tablet (4 mg total) by mouth every 6 (six) hours. Patient not taking: Reported on 10/10/2014 08/26/14   Blanchie Dessert, MD  oxyCODONE-acetaminophen  (PERCOCET) 5-325 MG per tablet Take 1-2 tablets by mouth every 4 (four) hours as needed. Patient not taking: Reported on 11/07/2014 10/10/14   Margarita Mail, PA-C  pantoprazole (PROTONIX) 40 MG tablet Take 1 tablet (40 mg total) by mouth daily. Patient not taking: Reported on 10/30/2014 12/21/13   Ricke Hey, MD   BP 177/94 mmHg  Pulse 100  Temp(Src) 98.5 F (36.9 C) (Oral)  Resp 14  SpO2 100%  LMP 10/12/2014 (Exact Date) Physical Exam  Constitutional: She appears well-developed and well-nourished. She appears distressed.  Pt sitting in exam bed, moaning in pain, rubbing her abdomen.  HENT:  Head: Normocephalic and atraumatic.  Eyes: Conjunctivae are normal. No scleral icterus.  Neck: Normal range of motion. Neck supple.  Cardiovascular: Normal rate, regular rhythm and normal heart sounds.   Pulmonary/Chest: Effort normal and breath sounds normal. No respiratory distress. She has no wheezes. She has no rales. She exhibits no tenderness.  Abdominal: Soft. Bowel sounds are normal. She exhibits no distension and no mass. There is tenderness. There is guarding. There is no rebound.  Soft, non-distended, diffuse tenderness w/o masses  Musculoskeletal: Normal range of motion.  Neurological: She is alert.  Skin: Skin is warm and dry. She is not diaphoretic.  Nursing note and vitals reviewed.   ED Course  Procedures (including critical care time) Labs Review Labs Reviewed  CBC WITH DIFFERENTIAL/PLATELET - Abnormal; Notable for the following:    Hemoglobin 11.5 (*)    HCT 33.4 (*)    All other components within normal limits  COMPREHENSIVE METABOLIC PANEL - Abnormal; Notable for the following:    Potassium 3.0 (*)    Glucose, Bld 326 (*)    Creatinine, Ser 1.27 (*)    Albumin 2.7 (*)    GFR calc non Af Amer 53 (*)    GFR calc Af Amer 61 (*)    All other components within normal limits  URINALYSIS, ROUTINE W REFLEX MICROSCOPIC - Abnormal; Notable for the following:     Color, Urine AMBER (*)    APPearance CLOUDY (*)    Specific Gravity, Urine 1.034 (*)    Glucose, UA >1000 (*)    Hgb urine dipstick LARGE (*)    Ketones, ur 40 (*)    Protein, ur >300 (*)    All other components within normal limits  URINE MICROSCOPIC-ADD ON - Abnormal; Notable for the following:    Bacteria, UA MANY (*)    All other components within normal limits  LIPASE, BLOOD  POC URINE PREG, ED    Imaging Review No results found.   EKG Interpretation None      MDM   Final diagnoses:  None    Pt is a 39yo female with recent admission for gastroparesis and THC induced cyclical vomiting syndrome, presenting to ED for second time in 2 days for continued diffuse abdominal pain, nausea and vomiting. Pt unable to keep  track of number of episodes of vomiting.  Pt appears uncomfortable, diffuse abdominal pain. Afebrile. No hx of abdominal surgeries. Mucous membranes are moist. Reviewed previous medical records including unremarkable CT abdominal scan. Do not believe repeat imaging would be of benefit at this time. Will get labs and admit for intractable vomiting as pt was offered admission last night but declined, attempted to manage symptoms at home with reglan and pepcid w/o success. Pt given IV fluids, morphine, pepcid, protonix, and phenergan.  Still vomiting in ED. BP increased to 216/111.  Reviewed recent discharge summary, pt was given norvasc and labetalol while admitted. Will give 5mg  labetalol in ED.  Pt has vomited 3 times while in ED but states she still feels nauseated.   3:54 PM CMP: mild increase in Cr since discharge 5 days ago increased from 1.16 to 1.27, was 1.49 yesterday at [facility].  K: c/w previous. Will consult with internal medicine.  PCP: Dr. Charlott Rakes.            Noland Fordyce, PA-C 11/08/14 1631  Evelina Bucy, MD 11/09/14 [phone]

## 2014-11-08 NOTE — H&P (Signed)
Date: 11/08/2014               Patient Name:  Kathy Frank MRN: [id number]  DOB: Feb 02, 1976 Age / Sex: 39 y.o., female   PCP: Riccardo Dubin, MD         Medical Service: Internal Medicine Teaching Service         Attending Physician: Dr. Aldine Contes, MD    First Contact: Dr. Lottie Mussel Pager: [phone]  Second Contact: Dr. Michail Jewels Pager: [phone]       After Hours (After 5p/  First Contact Pager: [phone]  weekends / holidays): Second Contact Pager: [phone]   Chief Complaint: nausea and vomiting daily   History of Present Illness: [name] is a 39 yo woman with a history of cyclical intractable nausea, poorly-controlled DM2, hypertension, depression, and marijuana abuse who presented to the ED when her nausea and vomiting did not improve since her discharge 5 days ago. She reports periodic nausea with about 10 episodes of vomiting daily. The vomitus is usually clear, with some streaks of blood. She also has some dull, burning pain in her abdomen that is relieved by vomiting. She has not been eating much at home, as a result of these symptoms.   On her last admission, she had a very similar presentation. By the day of discharge, she was able to eat and was sent home on a new prescription for reglan with the plan for outpatient gastric emptying study. She did not fill the reglan prescription. She also presented to the [facility] last night with vomiting. She was discharged from the ED when her symptoms improved.  Her most recent A1c was 9.9% (last week) and prior to this past week, she had been using marijuana multiple times daily for many years. She has no sick contacts and denies chest pain, changes in bowel movements or fever. Her last bowel movement was 3 days ago, but she has been passing gas.   Meds: No current facility-administered medications for this encounter.   Current Outpatient Prescriptions  Medication Sig Dispense Refill  . bismuth subsalicylate  (PEPTO BISMOL) 262 MG chewable tablet Chew 524 mg by mouth daily as needed for indigestion (indigestion).    Marland Kitchen buPROPion (WELLBUTRIN XL) 150 MG 24 hr tablet Take 1 tablet (150 mg total) by mouth daily. 30 tablet 2  . famotidine (PEPCID) 20 MG tablet Take 1 tablet (20 mg total) by mouth 2 (two) times daily. 30 tablet 0  . lisinopril (PRINIVIL,ZESTRIL) 20 MG tablet Take 1 tablet (20 mg total) by mouth daily. 30 tablet 6  . metFORMIN (GLUCOPHAGE) 500 MG tablet Take 2 tablets (1,000 mg total) by mouth 2 (two) times daily with a meal. 60 tablet 2  . promethazine (PHENERGAN) 25 MG tablet Take 1 tablet (25 mg total) by mouth every 6 (six) hours as needed for nausea or vomiting. 12 tablet 0  . traZODone (DESYREL) 100 MG tablet Take 1 tablet (100 mg total) by mouth at bedtime. 30 tablet 2  . metoCLOPramide (REGLAN) 5 MG tablet Take 1 tablet (5 mg total) by mouth 3 (three) times daily before meals. (Patient not taking: Reported on 10/30/2014) 90 tablet 6  . ondansetron (ZOFRAN ODT) 4 MG disintegrating tablet Take 1 tablet (4 mg total) by mouth every 8 (eight) hours as needed for nausea or vomiting. (Patient not taking: Reported on 10/30/2014) 20 tablet 0  . ondansetron (ZOFRAN) 4 MG tablet Take 1 tablet (4 mg total) by mouth  every 6 (six) hours. (Patient not taking: Reported on 10/10/2014) 12 tablet 0  . oxyCODONE-acetaminophen (PERCOCET) 5-325 MG per tablet Take 1-2 tablets by mouth every 4 (four) hours as needed. (Patient not taking: Reported on 11/07/2014) 20 tablet 0  . pantoprazole (PROTONIX) 40 MG tablet Take 1 tablet (40 mg total) by mouth daily. (Patient not taking: Reported on 10/30/2014) 30 tablet 6    Allergies: Allergies as of 11/08/2014  . (No Known Allergies)   Past Medical History  Diagnosis Date  . Depression   . Type II diabetes mellitus   . Hypertension   . Gastroparesis   . Cyclical vomiting     THC induced   Past Surgical History  Procedure Laterality Date  . Tubal ligation  2002    No family history on file. History   Social History  . Marital Status: Single    Spouse Name: N/A  . Number of Children: N/A  . Years of Education: N/A   Occupational History  . Not on file.   Social History Main Topics  . Smoking status: Former Smoker -- 0.10 packs/day for 10 years    Types: Cigarettes    Start date: 11/17/2013    Quit date: 09/09/2014  . Smokeless tobacco: Never Used  . Alcohol Use: Yes     Comment: 11/13/2013 "occasionally; once or twice/month"  . Drug Use: Yes    Special: Marijuana  . Sexual Activity: Not Currently   Other Topics Concern  . Not on file   Social History Narrative    Review of Systems: See HPI  Physical Exam: Blood pressure 203/98, pulse 102, temperature 98.5 F (36.9 C), temperature source Oral, resp. rate 14, last menstrual period 10/12/2014, SpO2 100 %. Appearance: lying in bed in ED with daughter at bedside, appears tired but in no acute distress HEENT: AT/[place], PERRL, EOMi, no lymphadenopathy, dry mucous membranes Heart: tachycardic, regular rhythm, normal S1S2, no murmurs Lungs: CTAB, no wheezes Abdomen: decreased bowel sounds, soft, diffusely slightly tender, no guarding, no rebound, no organomegaly Extremities: no edema, normal range of motion Neurologic: A&Ox3, grossly intact Skin: no rashes or lesions, tattoos diffusely  Lab results: Basic Metabolic Panel:  Recent Labs  11/07/14 1546 11/08/14 1440  NA 135 136  K 2.7* 3.0*  CL 95* 101  CO2 27 23  GLUCOSE 327* 326*  BUN 17 12  CREATININE 1.49* 1.27*  CALCIUM 8.9 8.7   Creatinine is near baseline  Liver Function Tests:  Recent Labs  11/07/14 1546 11/08/14 1440  AST 16 14  ALT 12 12  ALKPHOS 75 68  BILITOT 0.5 0.7  PROT 7.5 6.7  ALBUMIN 3.2* 2.7*    Recent Labs  11/07/14 1546 11/08/14 1440  LIPASE 26 27   No results for input(s): AMMONIA in the last 72 hours. CBC:  Recent Labs  11/07/14 1546 11/08/14 1440  WBC 6.9 7.5  NEUTROABS 4.8  5.7  HGB 12.2 11.5*  HCT 34.7* 33.4*  MCV 86.1 84.3  PLT 390 341   CBG:  Recent Labs  11/07/14 1917  GLUCAP 275*   Urine Drug Screen: Drugs of Abuse     Component Value Date/Time   LABOPIA NONE DETECTED 10/30/2014 2349   COCAINSCRNUR NONE DETECTED 10/30/2014 2349   LABBENZ NONE DETECTED 10/30/2014 2349   AMPHETMU NONE DETECTED 10/30/2014 2349   THCU POSITIVE* 10/30/2014 2349   LABBARB NONE DETECTED 10/30/2014 2349    Urinalysis:  Recent Labs  11/08/14 1415  COLORURINE AMBER*  LABSPEC 1.034*  PHURINE 7.5  GLUCOSEU >1000*  HGBUR LARGE*  BILIRUBINUR NEGATIVE  KETONESUR 40*  PROTEINUR >300*  UROBILINOGEN 1.0  NITRITE NEGATIVE  LEUKOCYTESUR NEGATIVE     Assessment & Plan by Problem: Principal Problem:   Nausea and vomiting Active Problems:   Diabetes mellitus type II, uncontrolled   Nausea & vomiting   Hypertension  Ms. Holt is a 39 yo woman with poorly controlled DM2, gastroparesis, marijuana use and hypertension who presented for the second time in a week for intractable vomiting. She has been noncompliant with her reglan. Though her electrolytes are within normal limits and she feels better with hydration, she will be admitted for IV hydration and IV antiemetics.  Cyclical Intractable Vomiting: This could be gastroparesis or cannabinoid hyperemesis syndrome. Chest x-ray and abdominal x-ray series on her last admission were negative for acute findings. Patient's UDS was positive for THC on her last admission, and she reports using marijuana multiple times daily until this week (she last used last Saturday). Patient reports ~10 episodes of watery vomitus daily with occasional streaks of blood. Vomiting relieves some abdominal discomfort. She has been taking her phenergan, but did not fill her reglan prescription after her recent discharge. Has not been eating. - Continue reglan 10 mg IV TID - IV protonix 40 mg IV daily - Zofran 4 mg PRN - IVF - Consider gastric  emptying study  - Trend BMET tomorrow am - Care management consult for help with medications  Hypokalemia: at 3.0. - KCl 10 mg IV x3 - Repeat BMET tomo am  Poorly Controlled DM2: Patient was found to have an HBA1C of 9.9% during her last admission. Her HBA1Cs have ranged from 9.4-11.4 over several years. She has not been taking her metformin (500 mg BID) this week.  - SSI - CBG monitoring  Hypertension: Patient was discharged on lisinopril 20 mg daily. Currently 203/98. - Holding lisinopril while creatinine slightly elevated  - IV hydralazine 10 mg IV  Depression: Home meds include Wellbutrin 150 mg, trazodone 100 mg - Hold Wellbutrin 150 mg daily until taking PO - Hold trazodone 100 mg daily until taking PO  Marijuana Abuse: Cessation counseling.  Diet: NPO  DVT Ppx: heparin [place]  Dispo: Disposition is deferred at this time, awaiting improvement of current medical problems. Anticipated discharge in approximately 1-2 day(s).   The patient does have a current PCP (Rushil Sherrye Payor, MD) and does need an [facility] hospital follow-up appointment after discharge.  The patient does not have transportation limitations that hinder transportation to clinic appointments.  Signed: Karlene Einstein, MD 11/08/2014, 5:12 PM

## 2014-11-08 NOTE — Telephone Encounter (Signed)
Kathy Frank was recently admitted to the hospital for similar complaints.  I agree with recommendations to re-present to the ED given apparent inability to keep either solids or liquids down as she may become dehydrated.

## 2014-11-08 NOTE — ED Notes (Signed)
Admitting physician paged. 

## 2014-11-09 LAB — BASIC METABOLIC PANEL
Anion gap: 5 (ref 5–15)
BUN: 10 mg/dL (ref 6–23)
CO2: 26 mmol/L (ref 19–32)
Calcium: 8.3 mg/dL — ABNORMAL LOW (ref 8.4–10.5)
Chloride: 106 mmol/L (ref 96–112)
Creatinine, Ser: 1.16 mg/dL — ABNORMAL HIGH (ref 0.50–1.10)
GFR calc Af Amer: 68 mL/min — ABNORMAL LOW (ref >90)
GFR calc non Af Amer: 59 mL/min — ABNORMAL LOW (ref >90)
Glucose, Bld: 203 mg/dL — ABNORMAL HIGH (ref 70–99)
Potassium: 3.2 mmol/L — ABNORMAL LOW (ref 3.5–5.1)
Sodium: 137 mmol/L (ref 135–145)

## 2014-11-09 LAB — CBC
HCT: 32.7 % — ABNORMAL LOW (ref 36.0–46.0)
Hemoglobin: 10.9 g/dL — ABNORMAL LOW (ref 12.0–15.0)
MCH: 28.6 pg (ref 26.0–34.0)
MCHC: 33.3 g/dL (ref 30.0–36.0)
MCV: 85.8 fL (ref 78.0–100.0)
Platelets: 323 10*3/uL (ref 150–400)
RBC: 3.81 MIL/uL — ABNORMAL LOW (ref 3.87–5.11)
RDW: 13.2 % (ref 11.5–15.5)
WBC: 6.1 10*3/uL (ref 4.0–10.5)

## 2014-11-09 LAB — GLUCOSE, CAPILLARY
Glucose-Capillary: 188 mg/dL — ABNORMAL HIGH (ref 70–99)
Glucose-Capillary: 210 mg/dL — ABNORMAL HIGH (ref 70–99)
Glucose-Capillary: 213 mg/dL — ABNORMAL HIGH (ref 70–99)

## 2014-11-09 MED ORDER — AMLODIPINE BESYLATE 10 MG PO TABS: 10.0000 mg | ORAL_TABLET | Freq: Every day | ORAL | Status: DC

## 2014-11-09 MED ORDER — ONDANSETRON HCL 4 MG PO TABS: 4.0000 mg | ORAL_TABLET | Freq: Three times a day (TID) | ORAL | Status: DC | PRN

## 2014-11-09 MED ORDER — DOCUSATE SODIUM 100 MG PO CAPS
100.0000 mg | ORAL_CAPSULE | Freq: Two times a day (BID) | ORAL | Status: DC | PRN
Start: 2014-11-09 — End: 2014-11-09

## 2014-11-09 MED ORDER — METOCLOPRAMIDE HCL 5 MG PO TABS: 5.0000 mg | ORAL_TABLET | Freq: Three times a day (TID) | ORAL | Status: DC

## 2014-11-09 MED ORDER — POTASSIUM CHLORIDE CRYS ER 20 MEQ PO TBCR
40.0000 meq | EXTENDED_RELEASE_TABLET | Freq: Once | ORAL | Status: AC
  Administered 2014-11-09: 40 meq via ORAL
  Filled 2014-11-09: qty 2

## 2014-11-09 MED ORDER — DOCUSATE SODIUM 100 MG PO CAPS: 100.0000 mg | ORAL_CAPSULE | Freq: Two times a day (BID) | ORAL | Status: DC | PRN

## 2014-11-09 NOTE — Discharge Instructions (Signed)
°  We have made some changes to your medicines. You should take reglan three times a day with food and can take the zofran 4 mg as needed every eight hours for nausea. We ask you to stop the lisinopril and take the amlodipine 10 mg until you see your doctor.

## 2014-11-09 NOTE — Progress Notes (Signed)
Patient admitted to floor from ED. Patient alert and oriented x 4. Patient BP elevated. Notified Patel MD. Patient informed of room information and call bell system. Will continue to monitor accordingly.

## 2014-11-09 NOTE — Progress Notes (Signed)
Subjective: Kathy Frank says that her nausea and emesis have resolved. She has not had any episode of emesis today. She would like to try to eat food. She says she did not pick up the reglan after last discharge because she may have used the wrong pharmacy? She agrees that if she is able to tolerate lunch she would like to go home. We educated her on marijuana cessation but she did not seem receptive.  Objective: Vital signs in last 24 hours: Filed Vitals:   11/08/14 2100 11/09/14 0136 11/09/14 0643 11/09/14 1030  BP: 168/92 135/72 147/74 172/84  Pulse:  92 88   Temp:   98.5 F (36.9 C)   TempSrc:   Oral   Resp:   16   SpO2:   100%    Weight change:   Intake/Output Summary (Last 24 hours) at 11/09/14 1218 Last data filed at 11/08/14 1908  Gross per 24 hour  Intake    100 ml  Output      0 ml  Net    100 ml   Gen: A&O x 4, No acute distress, well developed, well nourished HEENT: Atraumatic, PERRL, EOMI, sclerae anicteric, moist mucous membranes Heart: Regular rate and rhythm, normal S1 S2, no murmurs, rubs, or gallops Lungs: Clear to auscultation bilaterally, respirations unlabored Abd: Soft, non-tender, non-distended, + bowel sounds, no hepatosplenomegaly Ext: No edema or cyanosis  Lab Results: Basic Metabolic Panel:  Recent Labs Lab 11/08/14 1440 11/09/14 0624  NA 136 137  K 3.0* 3.2*  CL 101 106  CO2 23 26  GLUCOSE 326* 203*  BUN 12 10  CREATININE 1.27* 1.16*  CALCIUM 8.7 8.3*   Liver Function Tests:  Recent Labs Lab 11/07/14 1546 11/08/14 1440  AST 16 14  ALT 12 12  ALKPHOS 75 68  BILITOT 0.5 0.7  PROT 7.5 6.7  ALBUMIN 3.2* 2.7*    Recent Labs Lab 11/07/14 1546 11/08/14 1440  LIPASE 26 27   CBC:  Recent Labs Lab 11/07/14 1546 11/08/14 1440 11/09/14 0624  WBC 6.9 7.5 6.1  NEUTROABS 4.8 5.7  --   HGB 12.2 11.5* 10.9*  HCT 34.7* 33.4* 32.7*  MCV 86.1 84.3 85.8  PLT 390 341 323   CBG:  Recent Labs Lab 11/03/14 1110 11/07/14 1917  11/08/14 2103 11/09/14 0645 11/09/14 0759 11/09/14 1203  GLUCAP 184* 275* 256* 188* 210* 213*   Urine Drug Screen: Drugs of Abuse     Component Value Date/Time   LABOPIA POSITIVE* 11/08/2014 1415   COCAINSCRNUR NONE DETECTED 11/08/2014 1415   LABBENZ NONE DETECTED 11/08/2014 1415   AMPHETMU NONE DETECTED 11/08/2014 1415   THCU POSITIVE* 11/08/2014 1415   LABBARB POSITIVE* 11/08/2014 1415    Urinalysis:  Recent Labs Lab 11/08/14 1415  COLORURINE AMBER*  LABSPEC 1.034*  PHURINE 7.5  GLUCOSEU >1000*  HGBUR LARGE*  BILIRUBINUR NEGATIVE  KETONESUR 40*  PROTEINUR >300*  UROBILINOGEN 1.0  NITRITE NEGATIVE  LEUKOCYTESUR NEGATIVE    Micro Results: No results found for this or any previous visit (from the past 240 hour(s)). Studies/Results: No results found. Medications: I have reviewed the patient's current medications. Scheduled Meds: . amLODipine  10 mg Oral Daily  . buPROPion  150 mg Oral Daily  . heparin  5,000 Units Subcutaneous 3 times per day  . insulin aspart  0-9 Units Subcutaneous TID WC  . metoCLOPramide (REGLAN) injection  10 mg Intravenous 3 times per day  . pantoprazole (PROTONIX) IV  40 mg Intravenous Q24H  .  potassium chloride  40 mEq Oral Once  . traZODone  100 mg Oral QHS   Continuous Infusions:  PRN Meds:.docusate sodium, metoprolol, ondansetron **OR** ondansetron (ZOFRAN) IV Assessment/Plan: Principal Problem:   Nausea and vomiting Active Problems:   Diabetes mellitus type II, uncontrolled   Hypertension  Cyclical Intractable Vomiting: This could be gastroparesis or cannabinoid hyperemesis syndrome. Chest x-ray and abdominal x-ray series on her last admission were negative for acute findings. She admits to [facility] use which is also on UDS. She has had resolution of symptoms since hospitalization and beginning reglan, zofran, protonix. She did not have a clear reason why she did not pick up medicines after last discharge but sounds like she went to  wrong pharmacy? We educated her on THC abuse and that if she has gastroparesis she should try several small meals rather than traditional big meals. She would like to try lunch and go home if she can tolerate well.   -advance diet as tolerated from clear liquid - Continue reglan 10 mg IV TID - IV protonix 40 mg IV daily - Zofran 4 mg PRN - Consider gastric emptying study as out-patient - Care management consult for help with medications  Hypokalemia: Presenting K of 2.7 up to 3.2 this morning after iv supplementation. Likely secondary to emesis. - KDur 40 mEq - cont to monitor  AKI: Presenting creatinine of 1.49 in setting of severe emesis so likely 2/2 dehydration. This morning down to 1.16. -cont to monitor -hold ACEi in AKI -can now advance diet as tolerated -nausea management as noted above  Poorly Controlled DM2: Patient was found to have an HBA1C of 9.9% during her last admission. Her HBA1Cs have ranged from 9.4-11.4 over several years. She has not been taking her metformin (500 mg BID) this week.  - SSI - CBG monitoring  Hypertension: Patient was discharged on lisinopril 20 mg daily. Currently 172/84. - hold lisinopril 20 mg dailywhile creatinine slightly elevated - start amlodipine 10 mg daily - IV hydralazine 10 mg IV  Depression: Home meds include Wellbutrin 150 mg, trazodone 100 mg - resume Wellbutrin 150 mg daily - resume trazodone 100 mg daily  Marijuana Abuse: Cessation counseling.  Diet: clear liquid, advance diet as tolerated  DVT Ppx: heparin [place]  Dispo: Disposition is deferred at this time, awaiting improvement of current medical problems.  Anticipated discharge in approximately today.   The patient does have a current PCP (Rushil Sherrye Payor, MD) and does need an [facility] hospital follow-up appointment after discharge.  The patient does not have transportation limitations that hinder transportation to clinic appointments.  .Services Needed at time of discharge:  Y = Yes, Blank = No PT:   OT:   RN:   Equipment:   Other:       Kelby Aline, MD 11/09/2014, 12:18 PM

## 2014-11-09 NOTE — Progress Notes (Signed)
Patient d/c this afternoon. D/c instruction given and assessments remains unchanged prior to discharge.

## 2014-11-09 NOTE — Progress Notes (Signed)
Inpatient Diabetes Program Recommendations  AACE/ADA: New Consensus Statement on Inpatient Glycemic Control (2013)  Target Ranges:  Prepandial:   less than 140 mg/dL      Peak postprandial:   less than 180 mg/dL (1-2 hours)      Critically ill patients:  140 - 180 mg/dL   Results for Kathy Frank, Kathy Frank (MRN [id number]) as of 11/09/2014 08:14  Ref. Range 11/07/2014 19:17 11/08/2014 21:03 11/09/2014 06:45 11/09/2014 07:59  Glucose-Capillary Latest Range: 70-99 mg/dL 275 (H) 256 (H) 188 (H) 210 (H)   Reason for Visit: Intractable Vomiting  Diabetes history: DM 2 Outpatient Diabetes medications: Metformin 1,000 mg BID Current orders for Inpatient glycemic control: Novolog 0-9 units TID  Inpatient Diabetes Program Recommendations  Correction (SSI): While NPO, please change CBG's and Correction scale to Q4hrs.  Thanks,  Tama Headings RN, MSN, [facility] Inpatient Diabetes Coordinator Team Pager [phone]

## 2014-11-09 NOTE — Progress Notes (Signed)
UR completed 

## 2014-11-09 NOTE — Discharge Summary (Signed)
Name: Kathy Frank MRN: [id number] DOB: Jan 11, 1976 39 y.o. PCP: Riccardo Dubin, MD  Date of Admission: 11/08/2014  1:21 PM Date of Discharge: 11/09/2014 Attending Physician: Aldine Contes, MD  Discharge Diagnosis:  Principal Problem:   Nausea and vomiting Active Problems:   Diabetes mellitus type II, uncontrolled   Hypertension  Discharge Medications:   Medication List    STOP taking these medications        lisinopril 20 MG tablet  Commonly known as:  PRINIVIL,ZESTRIL     ondansetron 4 MG disintegrating tablet  Commonly known as:  ZOFRAN ODT     oxyCODONE-acetaminophen 5-325 MG per tablet  Commonly known as:  PERCOCET      TAKE these medications        amLODipine 10 MG tablet  Commonly known as:  NORVASC  Take 1 tablet (10 mg total) by mouth daily.     bismuth subsalicylate 99991111 MG chewable tablet  Commonly known as:  PEPTO BISMOL  Chew 524 mg by mouth daily as needed for indigestion (indigestion).     buPROPion 150 MG 24 hr tablet  Commonly known as:  WELLBUTRIN XL  Take 1 tablet (150 mg total) by mouth daily.     docusate sodium 100 MG capsule  Commonly known as:  COLACE  Take 1 capsule (100 mg total) by mouth 2 (two) times daily as needed for mild constipation.     famotidine 20 MG tablet  Commonly known as:  PEPCID  Take 1 tablet (20 mg total) by mouth 2 (two) times daily.     metFORMIN 500 MG tablet  Commonly known as:  GLUCOPHAGE  Take 2 tablets (1,000 mg total) by mouth 2 (two) times daily with a meal.     metoCLOPramide 5 MG tablet  Commonly known as:  REGLAN  Take 1 tablet (5 mg total) by mouth 3 (three) times daily before meals.     ondansetron 4 MG tablet  Commonly known as:  ZOFRAN  Take 1 tablet (4 mg total) by mouth every 8 (eight) hours as needed for nausea or vomiting.     pantoprazole 40 MG tablet  Commonly known as:  PROTONIX  Take 1 tablet (40 mg total) by mouth daily.     promethazine 25 MG tablet  Commonly known as:   PHENERGAN  Take 1 tablet (25 mg total) by mouth every 6 (six) hours as needed for nausea or vomiting.     traZODone 100 MG tablet  Commonly known as:  DESYREL  Take 1 tablet (100 mg total) by mouth at bedtime.        Disposition and follow-up:   [name] was discharged from [facility] in [place] condition.  At the hospital follow up visit please address:  1.  Further nausea/emesis, did she pick up Rx for reglan, antihypertensive regimen, THC use  2.  Labs / imaging needed at time of follow-up: BMP (K, creatinine), consider gastric emptying study  3.  Pending labs/ test needing follow-up: none  Follow-up Appointments: Follow-up Information    Follow up with Lucious Groves, DO On 11/14/2014.   Specialty:  Internal Medicine   Why:  @ 10:45 am   Contact information:   [address] [place] [address] [phone]       Discharge Instructions: Discharge Instructions    Diet - low sodium heart healthy    Complete by:  As directed      Increase activity  slowly    Complete by:  As directed            Consultations:    Procedures Performed:  Dg Abd Acute W/chest  10/31/2014   CLINICAL DATA:  Acute onset of periumbilical abdominal pain and vomiting. Hematemesis. Initial encounter.  EXAM: ACUTE ABDOMEN SERIES (ABDOMEN 2 VIEW & CHEST 1 VIEW)  COMPARISON:  Chest and abdominal radiographs performed 11/13/2013  FINDINGS: The lungs are well-aerated and clear. There is no evidence of focal opacification, pleural effusion or pneumothorax. The cardiomediastinal silhouette is within normal limits.  The visualized bowel gas pattern is unremarkable. There is a relative paucity of bowel gas within the abdomen. There is no evidence of small bowel dilatation to suggest obstruction. No free intra-abdominal air is identified on the provided upright view.  No acute osseous abnormalities are seen; the sacroiliac joints are unremarkable in appearance.  IMPRESSION: 1.  Unremarkable bowel gas pattern; no free intra-abdominal air seen. Relative paucity of bowel gas within the abdomen. 2. No acute cardiopulmonary process seen.   Electronically Signed   By: Garald Balding M.D.   On: 10/31/2014 01:41   Admission HPI: [name] is a 39 yo woman with a history of cyclical intractable nausea, poorly-controlled DM2, hypertension, depression, and marijuana abuse who presented to the ED when her nausea and vomiting did not improve since her discharge 5 days ago. She reports periodic nausea with about 10 episodes of vomiting daily. The vomitus is usually clear, with some streaks of blood. She also has some dull, burning pain in her abdomen that is relieved by vomiting. She has not been eating much at home, as a result of these symptoms.   On her last admission, she had a very similar presentation. By the day of discharge, she was able to eat and was sent home on a new prescription for reglan with the plan for outpatient gastric emptying study. She did not fill the reglan prescription. She also presented to the [facility] ID last night with vomiting. She was discharged from the ED when her symptoms improved.  Her most recent A1c was 9.9% (last week) and prior to this past week, she had been using marijuana multiple times daily for many years. She has no sick contacts and denies chest pain, changes in bowel movements or fever. Her last bowel movement was 3 days ago, but she has been passing gas.   Hospital Course by problem list: Principal Problem:   Nausea and vomiting Active Problems:   Diabetes mellitus type II, uncontrolled   Hypertension   Cyclical Intractable Vomiting: This could be gastroparesis or cannabinoid hyperemesis syndrome. Chest x-ray and abdominal x-ray series on her last admission were negative for acute findings. She admits to [facility] use which is also on UDS. She has had resolution of symptoms since hospitalization and beginning reglan, zofran, protonix.  She did not have a clear reason why she did not pick up medicines after last discharge but sounds like she went to wrong pharmacy? We educated her on THC abuse and that if she has gastroparesis she should try several small meals rather than traditional big meals. She would like to try lunch and go home if she can tolerate well.Prior to discharge, she had ice cream and jello and tolerated it without nausea. I refilled reglan 5 mg tidwc, zofran 4 mg q8hprn and resumed protonix 40 mg po daily (iv while hospitalized). She was advised to eat several small meals rather than traditional large meals as  well as marijuana cessation. Out-patient follow-up should consider gastric emptying study.  Hypokalemia: Presenting K of 2.7 up to 3.2 the morning of discharge after iv supplementation. Likely secondary to emesis. She was given KDur 40 mEq daily before discharge and follow-up should recheck BMP.  AKI: Presenting creatinine of 1.49 in setting of severe emesis so likely 2/2 dehydration. This was down to 1.16 prior to discharge. Her lisinopril 20 mg daily was held in setting of AKI and she was given amlodipine 10 mg daily. This should be reassessed at PCP follow-up.  Poorly Controlled DM2: Patient was found to have an HBA1C of 9.9% during her last admission. Her HBA1Cs have ranged from 9.4-11.4 over several years. She has not been taking her metformin (500 mg BID) this week. She had sliding scale while hospitalized but discharged on home medicines.  Hypertension: Patient was discharged on lisinopril 20 mg daily at last hospitalization. As described above, ACEi held in AKI and started on amlodipine. PCP should reassess.  Depression: Home meds include Wellbutrin 150 mg, trazodone 100 mg which were resumed when no longer NPO.  Marijuana Abuse: Cessation counseling provided and PCP should revisit.  Discharge Vitals:   BP 172/84 mmHg  Pulse 88  Temp(Src) 98.5 F (36.9 C) (Oral)  Resp 16  SpO2 100%  LMP  10/12/2014 (Exact Date)  Discharge Physical Exam: Gen: A&O x 4, No acute distress, well developed, well nourished HEENT: Atraumatic, PERRL, EOMI, sclerae anicteric, moist mucous membranes Heart: Regular rate and rhythm, normal S1 S2, no murmurs, rubs, or gallops Lungs: Clear to auscultation bilaterally, respirations unlabored Abd: Soft, non-tender, non-distended, + bowel sounds, no hepatosplenomegaly Ext: No edema or cyanosis  Discharge Labs:  Basic Metabolic Panel:  Recent Labs Lab 11/08/14 1440 11/09/14 0624  NA 136 137  K 3.0* 3.2*  CL 101 106  CO2 23 26  GLUCOSE 326* 203*  BUN 12 10  CREATININE 1.27* 1.16*  CALCIUM 8.7 8.3*   Liver Function Tests:  Recent Labs Lab 11/07/14 1546 11/08/14 1440  AST 16 14  ALT 12 12  ALKPHOS 75 68  BILITOT 0.5 0.7  PROT 7.5 6.7  ALBUMIN 3.2* 2.7*    Recent Labs Lab 11/07/14 1546 11/08/14 1440  LIPASE 26 27  CBC:  Recent Labs Lab 11/07/14 1546 11/08/14 1440 11/09/14 0624  WBC 6.9 7.5 6.1  NEUTROABS 4.8 5.7  --   HGB 12.2 11.5* 10.9*  HCT 34.7* 33.4* 32.7*  MCV 86.1 84.3 85.8  PLT 390 341 323  CBG:  Recent Labs Lab 11/03/14 1110 11/07/14 1917 11/08/14 2103 11/09/14 0645 11/09/14 0759 11/09/14 1203  GLUCAP 184* 275* 256* 188* 210* 213*   Urine Drug Screen: Drugs of Abuse     Component Value Date/Time   LABOPIA POSITIVE* 11/08/2014 1415   COCAINSCRNUR NONE DETECTED 11/08/2014 1415   LABBENZ NONE DETECTED 11/08/2014 1415   AMPHETMU NONE DETECTED 11/08/2014 1415   THCU POSITIVE* 11/08/2014 1415   LABBARB POSITIVE* 11/08/2014 1415    Urinalysis:  Recent Labs Lab 11/08/14 1415  COLORURINE AMBER*  LABSPEC 1.034*  PHURINE 7.5  GLUCOSEU >1000*  HGBUR LARGE*  BILIRUBINUR NEGATIVE  KETONESUR 40*  PROTEINUR >300*  UROBILINOGEN 1.0  NITRITE NEGATIVE  LEUKOCYTESUR NEGATIVE      Signed: Kelby Aline, MD 11/09/2014, 2:21 PM    Services Ordered on Discharge: none Equipment Ordered on Discharge:  none

## 2014-11-13 ENCOUNTER — Telehealth: Payer: Self-pay | Admitting: Internal Medicine

## 2014-11-13 NOTE — Telephone Encounter (Signed)
Call to patient to confirm appointment for 11/14/14 at 10:45 lmtcb

## 2014-11-14 ENCOUNTER — Encounter: Payer: Self-pay | Admitting: Internal Medicine

## 2014-11-14 ENCOUNTER — Other Ambulatory Visit: Payer: Self-pay | Admitting: Dietician

## 2014-11-14 ENCOUNTER — Ambulatory Visit (INDEPENDENT_AMBULATORY_CARE_PROVIDER_SITE_OTHER): Payer: BLUE CROSS/BLUE SHIELD | Admitting: Internal Medicine

## 2014-11-14 VITALS — BP 170/97 | HR 89 | Temp 98.3°F | Ht 65.0 in | Wt 175.4 lb

## 2014-11-14 DIAGNOSIS — IMO0002 Reserved for concepts with insufficient information to code with codable children: Secondary | ICD-10-CM

## 2014-11-14 DIAGNOSIS — I10 Essential (primary) hypertension: Secondary | ICD-10-CM | POA: Diagnosis not present

## 2014-11-14 DIAGNOSIS — R312 Other microscopic hematuria: Secondary | ICD-10-CM | POA: Diagnosis not present

## 2014-11-14 DIAGNOSIS — E1129 Type 2 diabetes mellitus with other diabetic kidney complication: Secondary | ICD-10-CM | POA: Diagnosis not present

## 2014-11-14 DIAGNOSIS — R809 Proteinuria, unspecified: Secondary | ICD-10-CM | POA: Diagnosis not present

## 2014-11-14 DIAGNOSIS — E1165 Type 2 diabetes mellitus with hyperglycemia: Secondary | ICD-10-CM

## 2014-11-14 DIAGNOSIS — G43A0 Cyclical vomiting, not intractable: Secondary | ICD-10-CM | POA: Diagnosis not present

## 2014-11-14 DIAGNOSIS — R3129 Other microscopic hematuria: Secondary | ICD-10-CM

## 2014-11-14 DIAGNOSIS — R1115 Cyclical vomiting syndrome unrelated to migraine: Secondary | ICD-10-CM

## 2014-11-14 LAB — BASIC METABOLIC PANEL WITH GFR
BUN: 14 mg/dL (ref 6–23)
CO2: 25 mEq/L (ref 19–32)
Calcium: 8.7 mg/dL (ref 8.4–10.5)
Chloride: 102 mEq/L (ref 96–112)
Creat: 0.92 mg/dL (ref 0.50–1.10)
GFR, Est African American: >89 mL/min
GFR, Est Non African American: 79 mL/min
Glucose, Bld: 303 mg/dL — ABNORMAL HIGH (ref 70–99)
Potassium: 3.7 mEq/L (ref 3.5–5.3)
Sodium: 136 mEq/L (ref 135–145)

## 2014-11-14 LAB — HM DIABETES EYE EXAM

## 2014-11-14 LAB — GLUCOSE, CAPILLARY: Glucose-Capillary: 300 mg/dL — ABNORMAL HIGH (ref 70–99)

## 2014-11-14 MED ORDER — METFORMIN HCL 1000 MG PO TABS: 1000.0000 mg | ORAL_TABLET | Freq: Two times a day (BID) | ORAL | Status: DC

## 2014-11-14 MED ORDER — AMLODIPINE BESYLATE 5 MG PO TABS: 5.0000 mg | ORAL_TABLET | Freq: Every day | ORAL | Status: DC

## 2014-11-14 MED ORDER — INSULIN PEN NEEDLE 32G X 4 MM MISC: 1.0000 [IU] | Freq: Every day | Status: DC

## 2014-11-14 MED ORDER — INSULIN GLARGINE 100 UNIT/ML SOLOSTAR PEN: 15.0000 [IU] | PEN_INJECTOR | Freq: Every day | SUBCUTANEOUS | Status: DC

## 2014-11-14 NOTE — Assessment & Plan Note (Addendum)
Lab Results  Component Value Date   HGBA1C 9.9* 10/31/2014   HGBA1C 10.0* 11/13/2013   HGBA1C 11.4 12/05/2012     Assessment: Diabetes control: poor control (HgbA1C >9%) Progress toward A1C goal:  unchanged Comments: NEEDS BETTER CONTROL  Plan: Medications:  Continue Metformin 1g BID, will start 15 u of Lantus daily Home glucose monitoring: Frequency: 2 times a day Timing: before breakfast, before dinner Instruction/counseling given: reminded to get eye exam, reminded to bring blood glucose meter & log to each visit and reminded to bring medications to each visit Educational resources provided: brochure, handout, video Self management tools provided:   Other plans: I am restarting her lisinopril which to better control blood pressure and reduce proteinuria.  Will have her back for close follow up in 1 month, i asked that she bring her meter for review and insulin titration.  She was given a copay card to help reduce lantus copay (she prefers lantus)

## 2014-11-14 NOTE — Progress Notes (Signed)
[facility] Subjective:   Patient ID: PAIDEN Kathy Frank female   DOB: [birth year] 39 y.o.   MRN: [id number]  HPI: [name] is a 39 y.o. female with a PMH detailed below who presents for HFU after admission for uncontrolled nausea and vomiting felt to be due to marijuana induced hyperemesis versus diabetic gastroparesis.  She reports she has been taking reglan and her symptoms have resolved, she has used marijuana since discharge and did not cause nausea or vomiting.  Today her only complaint is bilateral leg swelling.  HTN elevated today, lisinopril was held on admission and discontinued on discharge she was started on amlodipine.  Uncontrolled DM did not bring meter, CBG is elevated at 300.  Of note she does have significant proteinuria and glucoseuria on multiple recent U/A.  She reports she is rarely checking her CBGs but that she knows it is high, she is taking metformin 1g BID  Hematuria: she has had microscopic hematuria on every U/A in our system dating back to 2010 with TNTC on her last one 11/08/14.    Past Medical History  Diagnosis Date  . Depression   . Type II diabetes mellitus   . Hypertension   . Gastroparesis   . Cyclical vomiting     THC induced   Current Outpatient Prescriptions  Medication Sig Dispense Refill  . buPROPion (WELLBUTRIN XL) 150 MG 24 hr tablet Take 1 tablet (150 mg total) by mouth daily. 30 tablet 2  . docusate sodium (COLACE) 100 MG capsule Take 1 capsule (100 mg total) by mouth 2 (two) times daily as needed for mild constipation. 30 capsule 0  . famotidine (PEPCID) 20 MG tablet Take 1 tablet (20 mg total) by mouth 2 (two) times daily. 30 tablet 0  . metFORMIN (GLUCOPHAGE) 1000 MG tablet Take 1 tablet (1,000 mg total) by mouth 2 (two) times daily with a meal. 60 tablet 5  . metoCLOPramide (REGLAN) 5 MG tablet Take 1 tablet (5 mg total) by mouth 3 (three) times daily before meals. 90 tablet 0  . traZODone (DESYREL) 100 MG  tablet Take 1 tablet (100 mg total) by mouth at bedtime. 30 tablet 2  . amLODipine (NORVASC) 5 MG tablet Take 1 tablet (5 mg total) by mouth daily. 30 tablet 2  . Insulin Glargine (LANTUS) 100 UNIT/ML Solostar Pen Inject 15 Units into the skin daily at 10 pm. 15 mL 5  . Insulin Pen Needle 32G X 4 MM MISC 1 Units by Does not apply route daily. 100 each 2  . lisinopril (PRINIVIL,ZESTRIL) 20 MG tablet   6   No current facility-administered medications for this visit.   No family history on file. History   Social History  . Marital Status: Single    Spouse Name: N/A  . Number of Children: N/A  . Years of Education: N/A   Social History Main Topics  . Smoking status: Former Smoker -- 0.10 packs/day for 10 years    Types: Cigarettes    Start date: 11/17/2013    Quit date: 09/09/2014  . Smokeless tobacco: Never Used  . Alcohol Use: Yes     Comment: 11/13/2013 "occasionally; once or twice/month"  . Drug Use: Yes    Special: Marijuana  . Sexual Activity: Not Currently   Other Topics Concern  . None   Social History Narrative   Review of Systems: Review of Systems  Eyes: Negative for blurred vision.  Respiratory: Negative for cough and shortness of breath.  Cardiovascular: Positive for leg swelling. Negative for chest pain.  Gastrointestinal: Negative for heartburn, nausea, vomiting, abdominal pain, diarrhea and constipation.  Neurological: Negative for dizziness and headaches.  Psychiatric/Behavioral: Positive for substance abuse [facility] since discharge).     Objective:  Physical Exam: Filed Vitals:   11/14/14 1102  BP: 170/97  Pulse: 89  Temp: 98.3 F (36.8 C)  TempSrc: Oral  Height: 5\' 5"  (1.651 m)  Weight: 175 lb 6.4 oz (79.561 kg)  SpO2: 100%  Physical Exam  Constitutional: She is well-developed, well-nourished, and in no distress.  HENT:  Head: Normocephalic and atraumatic.  Mouth/Throat: Oropharynx is clear and moist.  Eyes: Conjunctivae are normal.   Cardiovascular: Normal rate and regular rhythm.   No murmur heard. Pulmonary/Chest: Effort normal and breath sounds normal.  Abdominal: Soft. Bowel sounds are normal. She exhibits no distension. There is no tenderness.  Musculoskeletal: She exhibits edema (1+ bilateral).  Psychiatric: Affect normal.  Nursing note and vitals reviewed.   Assessment & Plan:  Case discussed with Dr. Eppie Gibson  Nausea and vomiting Her symptoms have resolved with reglan and she has been a very uncontrolled diabetic for quite some time, in addition she has evidence of diabetic nephropathy. -I suspect that her symptoms were due to diabetic gastroparesis, at her next visit we may consider trial off reglan and to obtain a gastric emptying study.   Essential hypertension BP Readings from Last 3 Encounters:  11/14/14 170/97  11/09/14 136/72  11/03/14 146/98    Lab Results  Component Value Date   NA 137 11/09/2014   K 3.2* 11/09/2014   CREATININE 1.16* 11/09/2014    Assessment: Blood pressure control: moderately elevated Progress toward BP goal:  unchanged Comments: off lisinopril, on 10mg  of amlodipine  Plan: Medications:  Restart 20mg  of lisinopril and decrease amlodipine to 5mg  due to lower extremity swelling. Educational resources provided: brochure, handout, video Self management tools provided:   Other plans: Check renal function with BMP today, follow up in 1 month.    Proteinuria -Likely represents diabetic nephropathy, she needs better control of BP and DM which we are working on.  I will restart her lisinopril to help with her proteinuria.   Microscopic hematuria - Has noted Microscopic hematuria and serial U/A.  Given her uncontrolled DM and proteinuria this may by due to nephrotic syndrome due to diabetes but we may need to eval with a CT urography and cystoscopy given it has been persistent since 2010.  She is not currently on her menstural cycle and I will repeat a U/A today.   Type  II diabetes mellitus with renal manifestations, uncontrolled Lab Results  Component Value Date   HGBA1C 9.9* 10/31/2014   HGBA1C 10.0* 11/13/2013   HGBA1C 11.4 12/05/2012     Assessment: Diabetes control: poor control (HgbA1C >9%) Progress toward A1C goal:  unchanged Comments: NEEDS BETTER CONTROL  Plan: Medications:  Continue Metformin 1g BID, will start 15 u of Lantus daily Home glucose monitoring: Frequency: 2 times a day Timing: before breakfast, before dinner Instruction/counseling given: reminded to get eye exam, reminded to bring blood glucose meter & log to each visit and reminded to bring medications to each visit Educational resources provided: brochure, handout, video Self management tools provided:   Other plans: I am restarting her lisinopril which to better control blood pressure and reduce proteinuria.  Will have her back for close follow up in 1 month, i asked that she bring her meter for review and insulin titration.  She  was given a copay card to help reduce lantus copay (she prefers lantus)        Medications Ordered Meds ordered this encounter  Medications  . lisinopril (PRINIVIL,ZESTRIL) 20 MG tablet    Sig:     Refill:  6  . metFORMIN (GLUCOPHAGE) 1000 MG tablet    Sig: Take 1 tablet (1,000 mg total) by mouth 2 (two) times daily with a meal.    Dispense:  60 tablet    Refill:  5  . Insulin Glargine (LANTUS) 100 UNIT/ML Solostar Pen    Sig: Inject 15 Units into the skin daily at 10 pm.    Dispense:  15 mL    Refill:  5  . Insulin Pen Needle 32G X 4 MM MISC    Sig: 1 Units by Does not apply route daily.    Dispense:  100 each    Refill:  2  . amLODipine (NORVASC) 5 MG tablet    Sig: Take 1 tablet (5 mg total) by mouth daily.    Dispense:  30 tablet    Refill:  2   Other Orders Orders Placed This Encounter  Procedures  . Glucose, capillary  . BMP with Estimated GFR (CPT-80048)  . Urinalysis, Reflex Microscopic  . Retinal/fundus photography     Standing Status: Future     Number of Occurrences:      Standing Expiration Date: 11/14/2015

## 2014-11-14 NOTE — Assessment & Plan Note (Addendum)
-   Has noted Microscopic hematuria and serial U/A.  Given her uncontrolled DM and proteinuria this may by due to nephrotic syndrome due to diabetes but we may need to eval with a CT urography and cystoscopy given it has been persistent since 2010.  She is not currently on her menstural cycle and I will repeat a U/A today.  ADDENDUM Microscopic hematuria still present, does not have RBC casts but is temporally related to proteinuria suggesting glomerular bleeding.  I do not think that she necessarily needs a CT scan or urologic evaluation as she does not have very significant risk factors or malignat conditions of her GU system.

## 2014-11-14 NOTE — Assessment & Plan Note (Signed)
BP Readings from Last 3 Encounters:  11/14/14 170/97  11/09/14 136/72  11/03/14 146/98    Lab Results  Component Value Date   NA 137 11/09/2014   K 3.2* 11/09/2014   CREATININE 1.16* 11/09/2014    Assessment: Blood pressure control: moderately elevated Progress toward BP goal:  unchanged Comments: off lisinopril, on 10mg  of amlodipine  Plan: Medications:  Restart 20mg  of lisinopril and decrease amlodipine to 5mg  due to lower extremity swelling. Educational resources provided: brochure, handout, video Self management tools provided:   Other plans: Check renal function with BMP today, follow up in 1 month.

## 2014-11-14 NOTE — Assessment & Plan Note (Signed)
Her symptoms have resolved with reglan and she has been a very uncontrolled diabetic for quite some time, in addition she has evidence of diabetic nephropathy. -I suspect that her symptoms were due to diabetic gastroparesis, at her next visit we may consider trial off reglan and to obtain a gastric emptying study.

## 2014-11-14 NOTE — Patient Instructions (Addendum)
General Instructions:  Please keep track of you blood sugars and bring your meter to next visit.  I want you to start back on lisinopril and taking half the dose of amlodipine (5mg ).  We may need to send you to the urologist depending on your urine specimen.  Thank you for bringing your medicines today. This helps [place] keep you safe from mistakes.   Progress Toward Treatment Goals:  Treatment Goal 11/14/2014  Hemoglobin A1C unchanged  Blood pressure unchanged    Self Care Goals & Plans:  Self Care Goal 11/14/2014  Manage my medications take my medicines as prescribed; bring my medications to every visit; refill my medications on time; follow the sick day instructions if I am sick  Monitor my health keep track of my blood glucose; keep track of my blood pressure; keep track of my weight; check my feet daily  Eat healthy foods eat more vegetables; eat fruit for snacks and desserts; eat baked foods instead of fried foods; eat foods that are low in salt; eat smaller portions; drink diet soda or water instead of juice or soda  Be physically active find an activity I enjoy  Meeting treatment goals -    Home Blood Glucose Monitoring 11/14/2014  Check my blood sugar no home glucose monitoring  When to check my blood sugar N/A     Care Management & Community Referrals:  Referral 11/24/2013  Referrals made for care management support none needed  Referrals made to community resources none

## 2014-11-14 NOTE — Assessment & Plan Note (Signed)
-  Likely represents diabetic nephropathy, she needs better control of BP and DM which we are working on.  I will restart her lisinopril to help with her proteinuria.

## 2014-11-15 LAB — URINALYSIS, ROUTINE W REFLEX MICROSCOPIC
Bilirubin Urine: NEGATIVE
Glucose, UA: 500 mg/dL — AB
Ketones, ur: NEGATIVE mg/dL
Leukocytes, UA: NEGATIVE
Nitrite: NEGATIVE
Protein, ur: >300 mg/dL — AB
Specific Gravity, Urine: 1.014 (ref 1.005–1.030)
Urobilinogen, UA: 1 mg/dL (ref 0.0–1.0)
pH: 6.5 (ref 5.0–8.0)

## 2014-11-15 LAB — URINALYSIS, MICROSCOPIC ONLY
Casts: NONE SEEN
Crystals: NONE SEEN
Squamous Epithelial / LPF: NONE SEEN

## 2014-11-15 NOTE — Progress Notes (Signed)
Case discussed with Dr. Heber De Land soon after the resident saw the patient.  We reviewed the resident's history and exam and pertinent patient test results.  I agree with the assessment, diagnosis and plan of care documented in the resident's note.

## 2014-11-20 ENCOUNTER — Encounter: Payer: Self-pay | Admitting: *Deleted

## 2014-11-21 ENCOUNTER — Encounter: Payer: Self-pay | Admitting: Internal Medicine

## 2014-11-21 DIAGNOSIS — E113393 Type 2 diabetes mellitus with moderate nonproliferative diabetic retinopathy without macular edema, bilateral: Secondary | ICD-10-CM

## 2014-11-21 HISTORY — DX: Type 2 diabetes mellitus with moderate nonproliferative diabetic retinopathy without macular edema, bilateral: E11.3393

## 2014-11-21 NOTE — Progress Notes (Signed)
Patient ID: Kathy Frank, female   DOB: Oct 06, 1975, 39 y.o.   MRN: [id number]  I have reviewed the report from Dr. 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 Recommended follow-up imaging in 6 months.  I will reiterate these updates to the patient at their next scheduled visit.

## 2014-12-09 DIAGNOSIS — E1110 Type 2 diabetes mellitus with ketoacidosis without coma: Secondary | ICD-10-CM

## 2014-12-09 HISTORY — DX: Type 2 diabetes mellitus with ketoacidosis without coma: E11.10

## 2014-12-11 ENCOUNTER — Other Ambulatory Visit: Payer: Self-pay | Admitting: *Deleted

## 2014-12-11 MED ORDER — FAMOTIDINE 20 MG PO TABS: 20.0000 mg | ORAL_TABLET | Freq: Two times a day (BID) | ORAL | Status: DC

## 2014-12-11 MED ORDER — METOCLOPRAMIDE HCL 5 MG PO TABS: 5.0000 mg | ORAL_TABLET | Freq: Three times a day (TID) | ORAL | Status: DC

## 2014-12-11 NOTE — Telephone Encounter (Signed)
Pt states she needs refill for percocet that she takes for "stomach pain"

## 2014-12-11 NOTE — Telephone Encounter (Signed)
As this patient was seen by Dr. Heber Hollister on 11/14/14 and deemed necessary for their treatment as documented in the EHR, I will refill this prescription for Reglan and Pepcid. She needs to follow-up for gastric emptying study to confirm her diagnosis of diabetic gastroparesis.

## 2014-12-12 NOTE — Telephone Encounter (Signed)
Though she is requesting Percocet for her abdominal pain, I do not feel this is an appropriate indication and should follow-up with the clinic for her further assessment and management of her abdominal pain. She certainly has clear indications for her abdominal pain, and opioids may complicate.

## 2014-12-13 NOTE — Telephone Encounter (Signed)
No answer, lm 

## 2014-12-14 ENCOUNTER — Encounter (HOSPITAL_COMMUNITY): Payer: Self-pay | Admitting: Emergency Medicine

## 2014-12-14 ENCOUNTER — Inpatient Hospital Stay (HOSPITAL_COMMUNITY)
Admission: EM | Admit: 2014-12-14 | Discharge: 2014-12-16 | DRG: 638 | Disposition: A | Discharge status: Home / Self Care | Payer: BLUE CROSS/BLUE SHIELD | Attending: Internal Medicine | Admitting: Internal Medicine

## 2014-12-14 DIAGNOSIS — I10 Essential (primary) hypertension: Secondary | ICD-10-CM | POA: Diagnosis present

## 2014-12-14 DIAGNOSIS — E1110 Type 2 diabetes mellitus with ketoacidosis without coma: Secondary | ICD-10-CM

## 2014-12-14 DIAGNOSIS — Z794 Long term (current) use of insulin: Secondary | ICD-10-CM | POA: Diagnosis not present

## 2014-12-14 DIAGNOSIS — R000 Tachycardia, unspecified: Secondary | ICD-10-CM | POA: Diagnosis not present

## 2014-12-14 DIAGNOSIS — G43A0 Cyclical vomiting, not intractable: Secondary | ICD-10-CM | POA: Diagnosis present

## 2014-12-14 DIAGNOSIS — F1290 Cannabis use, unspecified, uncomplicated: Secondary | ICD-10-CM | POA: Diagnosis present

## 2014-12-14 DIAGNOSIS — F329 Major depressive disorder, single episode, unspecified: Secondary | ICD-10-CM

## 2014-12-14 DIAGNOSIS — R312 Other microscopic hematuria: Secondary | ICD-10-CM | POA: Diagnosis present

## 2014-12-14 DIAGNOSIS — K3184 Gastroparesis: Secondary | ICD-10-CM | POA: Diagnosis present

## 2014-12-14 DIAGNOSIS — Z9119 Patient's noncompliance with other medical treatment and regimen: Secondary | ICD-10-CM | POA: Diagnosis present

## 2014-12-14 DIAGNOSIS — E1165 Type 2 diabetes mellitus with hyperglycemia: Secondary | ICD-10-CM

## 2014-12-14 DIAGNOSIS — D72829 Elevated white blood cell count, unspecified: Secondary | ICD-10-CM

## 2014-12-14 DIAGNOSIS — A599 Trichomoniasis, unspecified: Secondary | ICD-10-CM | POA: Insufficient documentation

## 2014-12-14 DIAGNOSIS — Z87891 Personal history of nicotine dependence: Secondary | ICD-10-CM | POA: Diagnosis not present

## 2014-12-14 DIAGNOSIS — E1310 Other specified diabetes mellitus with ketoacidosis without coma: Principal | ICD-10-CM | POA: Diagnosis present

## 2014-12-14 DIAGNOSIS — E860 Dehydration: Secondary | ICD-10-CM | POA: Diagnosis present

## 2014-12-14 DIAGNOSIS — N179 Acute kidney failure, unspecified: Secondary | ICD-10-CM

## 2014-12-14 DIAGNOSIS — F1210 Cannabis abuse, uncomplicated: Secondary | ICD-10-CM

## 2014-12-14 DIAGNOSIS — N898 Other specified noninflammatory disorders of vagina: Secondary | ICD-10-CM | POA: Diagnosis present

## 2014-12-14 DIAGNOSIS — G43A1 Cyclical vomiting, intractable: Secondary | ICD-10-CM

## 2014-12-14 LAB — COMPREHENSIVE METABOLIC PANEL
ALT: 16 U/L (ref 14–54)
AST: 27 U/L (ref 15–41)
Albumin: 3.3 g/dL — ABNORMAL LOW (ref 3.5–5.0)
Alkaline Phosphatase: 94 U/L (ref 38–126)
Anion gap: 19 — ABNORMAL HIGH (ref 5–15)
BUN: 14 mg/dL (ref 6–20)
CO2: 20 mmol/L — ABNORMAL LOW (ref 22–32)
Calcium: 9.7 mg/dL (ref 8.9–10.3)
Chloride: 98 mmol/L — ABNORMAL LOW (ref 101–111)
Creatinine, Ser: 1.41 mg/dL — ABNORMAL HIGH (ref 0.44–1.00)
GFR calc Af Amer: 54 mL/min — ABNORMAL LOW (ref >60)
GFR calc non Af Amer: 47 mL/min — ABNORMAL LOW (ref >60)
Glucose, Bld: 582 mg/dL (ref 70–99)
Potassium: 3.6 mmol/L (ref 3.5–5.1)
Sodium: 137 mmol/L (ref 135–145)
Total Bilirubin: 0.5 mg/dL (ref 0.3–1.2)
Total Protein: 8.5 g/dL — ABNORMAL HIGH (ref 6.5–8.1)

## 2014-12-14 LAB — URINALYSIS, ROUTINE W REFLEX MICROSCOPIC
Bilirubin Urine: NEGATIVE
Glucose, UA: >1000 mg/dL — AB
Ketones, ur: 15 mg/dL — AB
Leukocytes, UA: NEGATIVE
Nitrite: NEGATIVE
Protein, ur: >300 mg/dL — AB
Specific Gravity, Urine: 1.028 (ref 1.005–1.030)
Urobilinogen, UA: 0.2 mg/dL (ref 0.0–1.0)
pH: 6 (ref 5.0–8.0)

## 2014-12-14 LAB — GLUCOSE, CAPILLARY
Glucose-Capillary: 530 mg/dL — ABNORMAL HIGH (ref 70–99)
Glucose-Capillary: 425 mg/dL — ABNORMAL HIGH (ref 70–99)
Glucose-Capillary: 320 mg/dL — ABNORMAL HIGH (ref 70–99)
Glucose-Capillary: 316 mg/dL — ABNORMAL HIGH (ref 70–99)
Glucose-Capillary: 275 mg/dL — ABNORMAL HIGH (ref 70–99)
Glucose-Capillary: 161 mg/dL — ABNORMAL HIGH (ref 70–99)
Glucose-Capillary: 124 mg/dL — ABNORMAL HIGH (ref 70–99)
Glucose-Capillary: 154 mg/dL — ABNORMAL HIGH (ref 70–99)
Glucose-Capillary: 157 mg/dL — ABNORMAL HIGH (ref 70–99)
Glucose-Capillary: 178 mg/dL — ABNORMAL HIGH (ref 70–99)

## 2014-12-14 LAB — CBC WITH DIFFERENTIAL/PLATELET
Basophils Absolute: 0 10*3/uL (ref 0.0–0.1)
Basophils Relative: 0 % (ref 0–1)
Eosinophils Absolute: 0 10*3/uL (ref 0.0–0.7)
Eosinophils Relative: 0 % (ref 0–5)
HCT: 37.2 % (ref 36.0–46.0)
Hemoglobin: 12.8 g/dL (ref 12.0–15.0)
Lymphocytes Relative: 7 % — ABNORMAL LOW (ref 12–46)
Lymphs Abs: 0.8 10*3/uL (ref 0.7–4.0)
MCH: 29.3 pg (ref 26.0–34.0)
MCHC: 34.4 g/dL (ref 30.0–36.0)
MCV: 85.1 fL (ref 78.0–100.0)
Monocytes Absolute: 0.3 10*3/uL (ref 0.1–1.0)
Monocytes Relative: 3 % (ref 3–12)
Neutro Abs: 10.3 10*3/uL — ABNORMAL HIGH (ref 1.7–7.7)
Neutrophils Relative %: 90 % — ABNORMAL HIGH (ref 43–77)
Platelets: 401 10*3/uL — ABNORMAL HIGH (ref 150–400)
RBC: 4.37 MIL/uL (ref 3.87–5.11)
RDW: 13.4 % (ref 11.5–15.5)
WBC: 11.4 10*3/uL — ABNORMAL HIGH (ref 4.0–10.5)

## 2014-12-14 LAB — OCCULT BLOOD GASTRIC / DUODENUM (SPECIMEN CUP): Occult Blood, Gastric: POSITIVE — AB

## 2014-12-14 LAB — BASIC METABOLIC PANEL
Anion gap: 13 (ref 5–15)
Anion gap: 11 (ref 5–15)
Anion gap: 10 (ref 5–15)
BUN: 15 mg/dL (ref 6–20)
BUN: 16 mg/dL (ref 6–20)
BUN: 17 mg/dL (ref 6–20)
CO2: 22 mmol/L (ref 22–32)
CO2: 24 mmol/L (ref 22–32)
CO2: 25 mmol/L (ref 22–32)
Calcium: 9.1 mg/dL (ref 8.9–10.3)
Calcium: 9 mg/dL (ref 8.9–10.3)
Calcium: 8.9 mg/dL (ref 8.9–10.3)
Chloride: 106 mmol/L (ref 101–111)
Chloride: 105 mmol/L (ref 101–111)
Chloride: 104 mmol/L (ref 101–111)
Creatinine, Ser: 1.4 mg/dL — ABNORMAL HIGH (ref 0.44–1.00)
Creatinine, Ser: 1.23 mg/dL — ABNORMAL HIGH (ref 0.44–1.00)
Creatinine, Ser: 1.24 mg/dL — ABNORMAL HIGH (ref 0.44–1.00)
GFR calc Af Amer: 54 mL/min — ABNORMAL LOW (ref >60)
GFR calc Af Amer: >60 mL/min (ref >60)
GFR calc Af Amer: >60 mL/min (ref >60)
GFR calc non Af Amer: 47 mL/min — ABNORMAL LOW (ref >60)
GFR calc non Af Amer: 55 mL/min — ABNORMAL LOW (ref >60)
GFR calc non Af Amer: 54 mL/min — ABNORMAL LOW (ref >60)
Glucose, Bld: 369 mg/dL — ABNORMAL HIGH (ref 70–99)
Glucose, Bld: 307 mg/dL — ABNORMAL HIGH (ref 70–99)
Glucose, Bld: 165 mg/dL — ABNORMAL HIGH (ref 70–99)
Potassium: 3.4 mmol/L — ABNORMAL LOW (ref 3.5–5.1)
Potassium: 3.4 mmol/L — ABNORMAL LOW (ref 3.5–5.1)
Potassium: 3.2 mmol/L — ABNORMAL LOW (ref 3.5–5.1)
Sodium: 141 mmol/L (ref 135–145)
Sodium: 140 mmol/L (ref 135–145)
Sodium: 139 mmol/L (ref 135–145)

## 2014-12-14 LAB — I-STAT VENOUS BLOOD GAS, ED
Acid-base deficit: 2 mmol/L (ref 0.0–2.0)
Bicarbonate: 21.5 mEq/L (ref 20.0–24.0)
O2 Saturation: 87 %
TCO2: 22 mmol/L (ref 0–100)
pCO2, Ven: 33.6 mmHg — ABNORMAL LOW (ref 45.0–50.0)
pH, Ven: 7.414 — ABNORMAL HIGH (ref 7.250–7.300)
pO2, Ven: 51 mmHg — ABNORMAL HIGH (ref 30.0–45.0)

## 2014-12-14 LAB — URINE MICROSCOPIC-ADD ON

## 2014-12-14 LAB — RAPID URINE DRUG SCREEN, HOSP PERFORMED
Amphetamines: NOT DETECTED
Barbiturates: NOT DETECTED
Benzodiazepines: NOT DETECTED
Cocaine: NOT DETECTED
Opiates: NOT DETECTED
Tetrahydrocannabinol: POSITIVE — AB

## 2014-12-14 LAB — PREGNANCY, URINE: Preg Test, Ur: NEGATIVE

## 2014-12-14 LAB — MRSA PCR SCREENING: MRSA by PCR: NEGATIVE

## 2014-12-14 LAB — LIPASE, BLOOD: Lipase: 36 U/L (ref 22–51)

## 2014-12-14 LAB — MAGNESIUM: Magnesium: 1.6 mg/dL — ABNORMAL LOW (ref 1.7–2.4)

## 2014-12-14 MED ORDER — SODIUM CHLORIDE 0.9 % IV SOLN
INTRAVENOUS | Status: DC
  Administered 2014-12-14: 4.7 [IU]/h via INTRAVENOUS
  Administered 2014-12-14: 13:00:00 via INTRAVENOUS
  Filled 2014-12-14: qty 2.5

## 2014-12-14 MED ORDER — SODIUM CHLORIDE 0.9 % IV SOLN
1000.0000 mL | Freq: Once | INTRAVENOUS | Status: AC
  Administered 2014-12-14: 1000 mL via INTRAVENOUS

## 2014-12-14 MED ORDER — SODIUM CHLORIDE 0.9 % IV SOLN
INTRAVENOUS | Status: DC
  Filled 2014-12-14: qty 2.5

## 2014-12-14 MED ORDER — LABETALOL HCL 5 MG/ML IV SOLN
5.0000 mg | Freq: Once | INTRAVENOUS | Status: AC
  Administered 2014-12-14: 5 mg via INTRAVENOUS
  Filled 2014-12-14: qty 4

## 2014-12-14 MED ORDER — POTASSIUM CHLORIDE 10 MEQ/100ML IV SOLN: 10.0000 meq | INTRAVENOUS | Status: DC

## 2014-12-14 MED ORDER — POTASSIUM CHLORIDE 10 MEQ/100ML IV SOLN
10.0000 meq | INTRAVENOUS | Status: AC
  Administered 2014-12-14: 10 meq via INTRAVENOUS
  Filled 2014-12-14: qty 100

## 2014-12-14 MED ORDER — MAGNESIUM SULFATE 2 GM/50ML IV SOLN
2.0000 g | Freq: Once | INTRAVENOUS | Status: AC
  Administered 2014-12-14: 2 g via INTRAVENOUS
  Filled 2014-12-14: qty 50

## 2014-12-14 MED ORDER — SODIUM CHLORIDE 0.9 % IV SOLN
INTRAVENOUS | Status: DC
  Administered 2014-12-14 (×3): via INTRAVENOUS

## 2014-12-14 MED ORDER — METOCLOPRAMIDE HCL 5 MG PO TABS
5.0000 mg | ORAL_TABLET | Freq: Three times a day (TID) | ORAL | Status: DC
  Administered 2014-12-15 – 2014-12-16 (×4): 5 mg via ORAL
  Filled 2014-12-14 (×8): qty 1

## 2014-12-14 MED ORDER — METRONIDAZOLE 500 MG PO TABS
2000.0000 mg | ORAL_TABLET | Freq: Once | ORAL | Status: AC
  Administered 2014-12-14: 2000 mg via ORAL
  Filled 2014-12-14: qty 4

## 2014-12-14 MED ORDER — AMLODIPINE BESYLATE 5 MG PO TABS
5.0000 mg | ORAL_TABLET | Freq: Every day | ORAL | Status: DC
  Filled 2014-12-14: qty 1

## 2014-12-14 MED ORDER — POTASSIUM CHLORIDE 10 MEQ/100ML IV SOLN
10.0000 meq | INTRAVENOUS | Status: AC
  Administered 2014-12-14 (×2): 10 meq via INTRAVENOUS
  Filled 2014-12-14 (×2): qty 100

## 2014-12-14 MED ORDER — ONDANSETRON HCL 4 MG/2ML IJ SOLN
4.0000 mg | INTRAMUSCULAR | Status: DC | PRN
  Administered 2014-12-14 – 2014-12-16 (×6): 4 mg via INTRAVENOUS
  Filled 2014-12-14 (×5): qty 2

## 2014-12-14 MED ORDER — ENOXAPARIN SODIUM 40 MG/0.4ML ~~LOC~~ SOLN
40.0000 mg | SUBCUTANEOUS | Status: DC
  Administered 2014-12-14 – 2014-12-15 (×2): 40 mg via SUBCUTANEOUS
  Filled 2014-12-14 (×3): qty 0.4

## 2014-12-14 MED ORDER — INSULIN GLARGINE 100 UNIT/ML ~~LOC~~ SOLN
10.0000 [IU] | Freq: Every day | SUBCUTANEOUS | Status: DC
  Administered 2014-12-14 – 2014-12-15 (×2): 10 [IU] via SUBCUTANEOUS
  Filled 2014-12-14 (×3): qty 0.1

## 2014-12-14 MED ORDER — HYDRALAZINE HCL 20 MG/ML IJ SOLN
5.0000 mg | INTRAMUSCULAR | Status: DC | PRN
  Administered 2014-12-15: 5 mg via INTRAVENOUS
  Filled 2014-12-14: qty 1

## 2014-12-14 MED ORDER — OXYCODONE-ACETAMINOPHEN 5-325 MG PO TABS: 1.0000 | ORAL_TABLET | ORAL | Status: DC | PRN

## 2014-12-14 MED ORDER — HYDRALAZINE HCL 20 MG/ML IJ SOLN
10.0000 mg | INTRAMUSCULAR | Status: AC
  Administered 2014-12-14: 10 mg via INTRAVENOUS
  Filled 2014-12-14: qty 1

## 2014-12-14 MED ORDER — POTASSIUM CHLORIDE 10 MEQ/100ML IV SOLN
10.0000 meq | INTRAVENOUS | Status: AC
  Administered 2014-12-15 (×5): 10 meq via INTRAVENOUS
  Filled 2014-12-14 (×4): qty 100

## 2014-12-14 MED ORDER — SODIUM CHLORIDE 0.9 % IV BOLUS (SEPSIS)
1000.0000 mL | Freq: Once | INTRAVENOUS | Status: AC
  Administered 2014-12-14: 1000 mL via INTRAVENOUS

## 2014-12-14 MED ORDER — POTASSIUM CHLORIDE CRYS ER 20 MEQ PO TBCR
40.0000 meq | EXTENDED_RELEASE_TABLET | Freq: Once | ORAL | Status: AC
  Administered 2014-12-14: 40 meq via ORAL
  Filled 2014-12-14: qty 2

## 2014-12-14 MED ORDER — DEXTROSE-NACL 5-0.45 % IV SOLN: INTRAVENOUS | Status: DC

## 2014-12-14 MED ORDER — ONDANSETRON HCL 4 MG/2ML IJ SOLN
4.0000 mg | Freq: Once | INTRAMUSCULAR | Status: AC
  Administered 2014-12-14: 4 mg via INTRAVENOUS
  Filled 2014-12-14: qty 2

## 2014-12-14 MED ORDER — ONDANSETRON HCL 4 MG/2ML IJ SOLN: 4.0000 mg | Freq: Three times a day (TID) | INTRAMUSCULAR | Status: DC | PRN

## 2014-12-14 MED ORDER — MORPHINE SULFATE 4 MG/ML IJ SOLN
4.0000 mg | Freq: Once | INTRAMUSCULAR | Status: AC
  Administered 2014-12-14: 4 mg via INTRAVENOUS
  Filled 2014-12-14: qty 1

## 2014-12-14 MED ORDER — SODIUM CHLORIDE 0.9 % IV SOLN: 1000.0000 mL | INTRAVENOUS | Status: DC

## 2014-12-14 MED ORDER — OXYCODONE-ACETAMINOPHEN 5-325 MG PO TABS
1.0000 | ORAL_TABLET | Freq: Four times a day (QID) | ORAL | Status: DC | PRN
  Administered 2014-12-14 – 2014-12-16 (×3): 1 via ORAL
  Filled 2014-12-14 (×3): qty 1

## 2014-12-14 MED ORDER — INSULIN ASPART 100 UNIT/ML ~~LOC~~ SOLN
0.0000 [IU] | Freq: Three times a day (TID) | SUBCUTANEOUS | Status: DC
  Administered 2014-12-15: 3 [IU] via SUBCUTANEOUS
  Administered 2014-12-15 (×2): 2 [IU] via SUBCUTANEOUS
  Administered 2014-12-16: 1 [IU] via SUBCUTANEOUS

## 2014-12-14 MED ORDER — DEXTROSE-NACL 5-0.45 % IV SOLN
INTRAVENOUS | Status: DC
  Administered 2014-12-14: 18:00:00 via INTRAVENOUS

## 2014-12-14 MED ORDER — BUPROPION HCL ER (XL) 150 MG PO TB24
150.0000 mg | ORAL_TABLET | Freq: Every day | ORAL | Status: DC
  Administered 2014-12-16: 150 mg via ORAL
  Filled 2014-12-14 (×3): qty 1

## 2014-12-14 NOTE — Progress Notes (Signed)
Utilization review completed.  

## 2014-12-14 NOTE — Progress Notes (Signed)
Paged on call regarding new sliding scale orders and a diet. Awaiting call back.

## 2014-12-14 NOTE — Progress Notes (Signed)
Page returned. New orders received. Will continue to monitor pt.

## 2014-12-14 NOTE — Care Management Note (Signed)
Case Management Note  Patient Details  Name: ANJELINA CUPPY MRN: [id number] Date of Birth: September 21, 1975  Subjective/Objective:      Pt admitted with DKA              Action/Plan: PTA pt lived at home  Expected Discharge Date:  12/14/14               Expected Discharge Plan:  Home/Self Care  In-House Referral:     Discharge planning Services  CM Consult, Medication Assistance  Post Acute Care Choice:    Choice offered to:     DME Arranged:    DME Agency:     HH Arranged:    [place] Agency:     Status of Service:  In process, will continue to follow  Medicare Important Message Given:    Date Medicare IM Given:    Medicare IM give by:    Date Additional Medicare IM Given:    Additional Medicare Important Message give by:     If discussed at [place] of Stay Meetings, dates discussed:    Additional Comments: Referral from [place] received from [place] regarding medication assist- pt shows that she has [employer]- would not qualify for assistance- NCM to follow for any further d/c needs  Dawayne Patricia, RN 12/14/2014, 3:51 PM

## 2014-12-14 NOTE — ED Provider Notes (Signed)
CSN: [id number]     Arrival date & time 12/14/14  0904 History   First MD Initiated Contact with Patient 12/14/14 0910     No chief complaint on file.    (Consider location/radiation/quality/duration/timing/severity/associated sxs/prior Treatment) HPI Comments: Patient is a 39 year old female past medical history significant for type II DM, hypertension, gastroparesis, cyclical vomiting syndrome presenting to the emergency department for evaluation of upper abdominal pain with nausea, multiple episodes of emesis that began yesterday. Patient endorses "coffee ground" appearing emesis. Denies taking any of her home medications. Denies any modifying factors. Abdominal surgical history includes tubal ligation. Denies any recent marijuana use.   Patient is a 39 y.o. female presenting with abdominal pain.  Abdominal Pain Pain location:  LUQ and RUQ Pain radiates to:  Does not radiate Pain severity:  Severe Onset quality:  Sudden Duration:  1 day Timing:  Constant Progression:  Worsening Chronicity:  Recurrent Context: retching   Relieved by:  Nothing Worsened by:  Vomiting and eating Ineffective treatments:  None tried Associated symptoms: nausea and vomiting   Vomiting:    Number of occurrences:  Numerous   Timing:  Constant   Progression:  Worsening Risk factors: no alcohol abuse     Past Medical History  Diagnosis Date  . Depression   . Type II diabetes mellitus     diagnosed around 1994  . Hypertension   . Gastroparesis     ? diabetic  . Cyclical vomiting     THC induced???   Past Surgical History  Procedure Laterality Date  . Tubal ligation  2002   No family history on file. History  Substance Use Topics  . Smoking status: Former Smoker -- 0.10 packs/day for 10 years    Types: Cigarettes    Start date: 11/17/2013    Quit date: 09/09/2014  . Smokeless tobacco: Never Used  . Alcohol Use: Yes     Comment: 11/13/2013 "occasionally; once or twice/month"   OB History     No data available     Review of Systems  Gastrointestinal: Positive for nausea, vomiting and abdominal pain.  All other systems reviewed and are negative.     Allergies  Review of patient's allergies indicates no known allergies.  Home Medications   Prior to Admission medications   Medication Sig Start Date End Date Taking? Authorizing Provider  amLODipine (NORVASC) 5 MG tablet Take 1 tablet (5 mg total) by mouth daily. 11/14/14 11/14/15  Lucious Groves, DO  buPROPion (WELLBUTRIN XL) 150 MG 24 hr tablet Take 1 tablet (150 mg total) by mouth daily. 11/24/13   Cresenciano Genre, MD  docusate sodium (COLACE) 100 MG capsule Take 1 capsule (100 mg total) by mouth 2 (two) times daily as needed for mild constipation. 11/09/14   Kelby Aline, MD  famotidine (PEPCID) 20 MG tablet Take 1 tablet (20 mg total) by mouth 2 (two) times daily. 12/11/14   Rushil Sherrye Payor, MD  Insulin Glargine (LANTUS) 100 UNIT/ML Solostar Pen Inject 15 Units into the skin daily at 10 pm. 11/14/14   Lucious Groves, DO  Insulin Pen Needle 32G X 4 MM MISC 1 Units by Does not apply route daily. 11/14/14   Lucious Groves, DO  lisinopril (PRINIVIL,ZESTRIL) 20 MG tablet  10/11/14   Historical Provider, MD  metFORMIN (GLUCOPHAGE) 1000 MG tablet Take 1 tablet (1,000 mg total) by mouth 2 (two) times daily with a meal. 11/14/14 11/14/15  Lucious Groves, DO  metoCLOPramide (REGLAN) 5  MG tablet Take 1 tablet (5 mg total) by mouth 3 (three) times daily before meals. 12/11/14   Rushil Sherrye Payor, MD  traZODone (DESYREL) 100 MG tablet Take 1 tablet (100 mg total) by mouth at bedtime. 11/24/13   Cresenciano Genre, MD   There were no vitals taken for this visit. Physical Exam  Constitutional: She is oriented to person, place, and time. She appears well-developed and well-nourished.  HENT:  Head: Normocephalic and atraumatic.  Right Ear: External ear normal.  Left Ear: External ear normal.  Nose: Nose normal.  Mouth/Throat: Uvula is midline and oropharynx is  clear and moist. Mucous membranes are dry.  Eyes: Conjunctivae and EOM are normal. Pupils are equal, round, and reactive to light.  Neck: Neck supple.  Cardiovascular: Normal rate, regular rhythm and normal heart sounds.   Pulmonary/Chest: Effort normal and breath sounds normal. No respiratory distress.  Abdominal: Soft. Bowel sounds are normal. She exhibits no distension. There is tenderness in the right upper quadrant, epigastric area and left upper quadrant. There is no rigidity, no rebound and no guarding.  Musculoskeletal: Normal range of motion. She exhibits no edema.  Neurological: She is alert and oriented to person, place, and time.  Moves all extremities w/o ataxia.   Skin: Skin is warm and dry.  Nursing note and vitals reviewed.   ED Course  Procedures (including critical care time) Medications  ondansetron (ZOFRAN) injection 4 mg (not administered)  sodium chloride 0.9 % bolus 1,000 mL (not administered)  morphine 4 MG/ML injection 4 mg (not administered)  hydrALAZINE (APRESOLINE) injection 10 mg (not administered)    Labs Review Labs Reviewed  PREGNANCY, URINE  URINALYSIS, ROUTINE W REFLEX MICROSCOPIC    Imaging Review No results found.   EKG Interpretation None      MDM   Final diagnoses:  Diabetic ketoacidosis without coma associated with type 2 diabetes mellitus   Filed Vitals:   12/14/14 1242  BP: 205/94  Pulse:   Temp:   Resp:    I have reviewed nursing notes, vital signs, and all appropriate lab and imaging results if ordered as above.   Patient presenting with nausea, vomiting, upper abdominal pain. Abdomen soft, tenderness appreciated in upper abdomen w/o peritoneal signs. No change from previous abdominal pain. Clear emesis noted in emesis bag w/o coffee ground emesis, gastrooccult sent just prior to admission. Labs reviewed, increase in creatinine. CO2 and Chloride decreased with anion gap of 19. Hypertension noted, no headache, CP, syncope,  SOB, or neuro complaint. Hydralazine given. Improvement of BP with hydralazine, fluids, and pain management. Will start on glucostabilizer and admit to internal medicine for further management. Patient d/w with Dr. Wyvonnia Dusky, agrees with plan.      Baron Sane, PA-C 12/14/14 [place], MD 12/14/14 [phone]

## 2014-12-14 NOTE — ED Notes (Signed)
Pt c/o LUQ & RUQ pain n/v that started yesterday. Pain 9/10. Pt sts she has seen coffee grounds in her vomit. Pt sts she is unable to keep anything down.

## 2014-12-14 NOTE — Clinical Social Work Note (Signed)
CSW Consult Acknowledged:   CSW received a consult for medication assistance. CSW will share this consult with case manager. CSW will sign off.       [place], MSW, [place]

## 2014-12-14 NOTE — H&P (Signed)
Date: 12/14/2014               Patient Name:  Kathy Frank MRN: [id number]  DOB: 18-Sep-1975 Age / Sex: 39 y.o., female   PCP: Riccardo Dubin, MD         Medical Service: Internal Medicine Teaching Service         Attending Physician: Dr. Madilyn Fireman, MD    First Contact: Dr. Lottie Mussel Pager: [phone]  Second Contact: Dr. Luanne Bras Pager: [phone]       After Hours (After 5p/  First Contact Pager: [phone]  weekends / holidays): Second Contact Pager: [phone]   Chief Complaint: abdominal pain  History of Present Illness: [name] is a 39 year old woman with history of cyclical intractable vomiting, poorly controlled DM2, HTN, depression, THC use who presents with abdominal pain.   Of note, she was recently hospitalized from [id number] for her cyclical intractable vomiting thought to be gastroparesis v cannabinoid hyperemesis syndrome. She had resolution of symptoms with reglan, zofran, and protonix. She had hospital follow-up in clinic 11/14/14 where she said she had been taking reglan with resolution of symptoms and no return with THC use.  She says that a few days ago she had return of nausea with associated vomiting and abdominal pain. She says the vomit has included episodes of dark and coffee ground appearance. She describes the pain as a sharp and crampy non-radiating epigastric pain that is worse when she eats and is nauseous. She says nothing makes the pain better. She says she has not taken her medicines regularly over this time period, including her lantus, due to her nausea. She says she has not been able to keep any food or drink down. She also notes some SOB when she is vomiting but otherwise none. She is also urinating often and thirsty. She also notes that she has had foul smelling vaginal discharge with last sexual activity about a month ago. She says she does not drink and has not smoked THC in several months. She otherwise denies any chest pain, fevers, chills,  night sweat, cough, weakness, vision changes, diarrhea, constipation.  In the ED, she had blood glucose 582 with anion gap 19. She received 1 L NS, zofran 4 mg iv once, morphine 4 mg iv once, and hydralazine 10 mg iv once for BP 231/111, prior to admission by IMTS.  Meds: Current Facility-Administered Medications  Medication Dose Route Frequency Provider Last Rate Last Dose  . 0.9 %  sodium chloride infusion  1,000 mL Intravenous Once [employer], PA-C       Followed by  . 0.9 %  sodium chloride infusion  1,000 mL Intravenous Continuous Jennifer Piepenbrink, PA-C      . dextrose 5 %-0.45 % sodium chloride infusion   Intravenous Continuous Jennifer Piepenbrink, PA-C      . hydrALAZINE (APRESOLINE) injection 10 mg  10 mg Intravenous STAT Jennifer Piepenbrink, PA-C      . insulin regular (NOVOLIN R,HUMULIN R) 250 Units in sodium chloride 0.9 % 250 mL (1 Units/mL) infusion   Intravenous Continuous Jennifer Piepenbrink, PA-C      . [START ON 12/15/2014] metroNIDAZOLE (FLAGYL) tablet 2,000 mg  2,000 mg Oral Once Corky Sox, MD      . ondansetron [facility]) injection 4 mg  4 mg Intravenous Q8H PRN Jennifer Piepenbrink, PA-C      . potassium chloride 10 mEq in 100 mL IVPB  10 mEq Intravenous Q1 Hr x 2 [place]  Bettey Costa, MD       Current Outpatient Prescriptions  Medication Sig Dispense Refill  . amLODipine (NORVASC) 5 MG tablet Take 1 tablet (5 mg total) by mouth daily. 30 tablet 2  . docusate sodium (COLACE) 100 MG capsule Take 1 capsule (100 mg total) by mouth 2 (two) times daily as needed for mild constipation. 30 capsule 0  . famotidine (PEPCID) 20 MG tablet Take 1 tablet (20 mg total) by mouth 2 (two) times daily. 180 tablet 1  . lisinopril (PRINIVIL,ZESTRIL) 20 MG tablet Take 20 mg by mouth daily.   6  . metFORMIN (GLUCOPHAGE) 1000 MG tablet Take 1 tablet (1,000 mg total) by mouth 2 (two) times daily with a meal. 60 tablet 5  . metoCLOPramide (REGLAN) 5 MG tablet Take 1 tablet (5 mg total) by  mouth 3 (three) times daily before meals. 90 tablet 1  . oxyCODONE-acetaminophen (PERCOCET/ROXICET) 5-325 MG per tablet Take 1 tablet by mouth every 4 (four) hours as needed for severe pain.    Marland Kitchen buPROPion (WELLBUTRIN XL) 150 MG 24 hr tablet Take 1 tablet (150 mg total) by mouth daily. 30 tablet 2  . Insulin Glargine (LANTUS) 100 UNIT/ML Solostar Pen Inject 15 Units into the skin daily at 10 pm. 15 mL 5  . Insulin Pen Needle 32G X 4 MM MISC 1 Units by Does not apply route daily. 100 each 2  . traZODone (DESYREL) 100 MG tablet Take 1 tablet (100 mg total) by mouth at bedtime. 30 tablet 2    Allergies: Allergies as of 12/14/2014  . (No Known Allergies)   Past Medical History  Diagnosis Date  . Depression   . Type II diabetes mellitus     diagnosed around 1994  . Hypertension   . Gastroparesis     ? diabetic  . Cyclical vomiting     THC induced???   Past Surgical History  Procedure Laterality Date  . Tubal ligation  2002   History reviewed. No pertinent family history. History   Social History  . Marital Status: Single    Spouse Name: N/A  . Number of Children: N/A  . Years of Education: 10   Occupational History  .  Unemployed   Social History Main Topics  . Smoking status: Former Smoker -- 0.10 packs/day for 10 years    Types: Cigarettes    Start date: 11/17/2013    Quit date: 09/09/2014  . Smokeless tobacco: Never Used  . Alcohol Use: Yes     Comment: 11/13/2013 "occasionally; once or twice/month"  . Drug Use: Yes    Special: Marijuana  . Sexual Activity: Not Currently   Other Topics Concern  . Not on file   Social History Narrative    Review of Systems: A comprehensive review of systems was negative except for: as noted above per HPI  Physical Exam: Blood pressure 171/89, pulse 107, temperature 98.5 F (36.9 C), temperature source Oral, resp. rate 16, last menstrual period 12/14/2014, SpO2 100 %.  Gen: A&O x 4, No acute distress, well developed, well  nourished HEENT: Atraumatic, PERRL, EOMI, sclerae anicteric, dry mucous membranes Heart: Tachycardic but regular rhythm, normal S1 S2, no murmurs, rubs, or gallops Lungs: Clear to auscultation bilaterally, respirations unlabored Abd: Soft, tenderness in RUQ, epigastric, and LUQ, no rebound tenderness, non-distended, + bowel sounds, no hepatosplenomegaly Ext: No edema or cyanosis  Lab results: Basic Metabolic Panel:  Recent Labs  12/14/14 0951  NA 137  K 3.6  CL 98*  CO2 20*  GLUCOSE 582*  BUN 14  CREATININE 1.41*  CALCIUM 9.7  Anion gap 19   Liver Function Tests:  Recent Labs  12/14/14 0951  AST 27  ALT 16  ALKPHOS 94  BILITOT 0.5  PROT 8.5*  ALBUMIN 3.3*    Recent Labs  12/14/14 0951  LIPASE 36  CBC:  Recent Labs  12/14/14 0951  WBC 11.4*  NEUTROABS 10.3*  HGB 12.8  HCT 37.2  MCV 85.1  PLT 401*   Urine Drug Screen: Drugs of Abuse     Component Value Date/Time   LABOPIA NONE DETECTED 12/14/2014 0946   COCAINSCRNUR NONE DETECTED 12/14/2014 0946   LABBENZ NONE DETECTED 12/14/2014 0946   AMPHETMU NONE DETECTED 12/14/2014 0946   THCU POSITIVE* 12/14/2014 0946   LABBARB NONE DETECTED 12/14/2014 0946    Urinalysis:  Recent Labs  12/14/14 0946  COLORURINE YELLOW  LABSPEC 1.028  PHURINE 6.0  GLUCOSEU >1000*  HGBUR LARGE*  BILIRUBINUR NEGATIVE  KETONESUR 15*  PROTEINUR >300*  UROBILINOGEN 0.2  NITRITE NEGATIVE  LEUKOCYTESUR NEGATIVE   Urine pregnancy negative  Imaging results:  No results found.  Other results: EKG: none in ED  Assessment & Plan by Problem: Active Problems:   DKA (diabetic ketoacidoses)  #DKA: Ms Toni is in DKA with presenting glucose 582, anion gap 19, bicarbonate 20. UA with ketones but unclear if she has DM1 v 2. At home she is supposed to be on metformin 1000 mg bid and lantus 15 u daily but she admits with her recent hyperemesis she has not been compliant which is likely trigger for her DKA. Last A1c 9.9 in  10/2014. She appeared dry on exam and was receiving first 1 L NS in ED.  -insulin gtt -BMP q4h -NS @ 125cc/hr -D51/2NS @ 125 cc/hr when CBG < 250 -transition to lantus 10 u daily when anion gap closed x 2, stopping insulin gtt two hours after lantus administration -NPO then carb modified diet w SSI-sensitive after insulin transition -supplement K as needed -telemetry  #Cyclical Intractable Vomiting: Ms Olmedo has had this issue chronically. This could be due to gastroparesis as she has poorly controlled diabetes but she notes she eats several small diets as advised. It could also be cannabinoid hyperemesis syndrome as she has THC use. She told [facility] provider at last visit she had no emesis while on reglan and smoking but also today said she has not used THC in weeks but was THC positive on UDS. It is because of this vomiting that she was non-compliant and is now in DKA. At home she is on reglan 5 mg tidac -zofran 4 mg iv q8hprn  #Possible Mallory Weiss Tear: She has much emesis and it tested positive for blood. Hemoglobin on presentation 12.8 -cont to monitor  #HTN: BP up to 231/111 in ED. She has not been consistently taking her medicines for several days due to her emesis. At home she is supposed to be on amlodpine 5 mg daily (recently reduced from 10 mg due to LE swelling, lisinopril 20 mg daily. -amlodipine 5 mg daily -hold ACEi in setting of AKI  #Trichomonas: UA in ED with trichomonas. She says she has vaginal discharge that is foul smelling. She has not had sex in a month. She was educated to have any sexual partners seen by medical provider and treated. -flagyl 2 g once tomorrow -patient instructed to notify partner so they can get treatment  #AKI: Presenting creatinine 1.41 from 0.92 on last discharge.  Likely dehydration from emesis and hyperglycemia. -hydration as above -cont to monitor  #Microscopic hematuria: UA w many hemoglobin and 11-20 RBC/HPF. This is a chronic finding since  2010. No dysuria. At last [facility] visit recommend CT urography and cystoscopy -likely out-patient CT urography v cystoscopy  #Depression: At home she takes bupropion 150 mg daily -cont bupropion 150 mg daily  #Substance abuse: Ms Ragonese says she uses THC but not in several weeks (THC positive on UDS). She has been counseled several times that this could contribute to her cyclical emesis.  #Diet: NPO then carb mod  #DVT PPx: lovenox 40 mg [place] daily  #Code: Full    Dispo: Disposition is deferred at this time, awaiting improvement of current medical problems. Anticipated discharge in approximately 2 day(s).   The patient does have a current PCP (Rushil Sherrye Payor, MD) and does need an [facility] hospital follow-up appointment after discharge.  The patient does not know have transportation limitations that hinder transportation to clinic appointments.  Signed: Kelby Aline, MD 12/14/2014, 12:27 PM

## 2014-12-15 DIAGNOSIS — A599 Trichomoniasis, unspecified: Secondary | ICD-10-CM | POA: Insufficient documentation

## 2014-12-15 LAB — GLUCOSE, CAPILLARY
Glucose-Capillary: 209 mg/dL — ABNORMAL HIGH (ref 70–99)
Glucose-Capillary: 197 mg/dL — ABNORMAL HIGH (ref 70–99)
Glucose-Capillary: 198 mg/dL — ABNORMAL HIGH (ref 70–99)
Glucose-Capillary: 156 mg/dL — ABNORMAL HIGH (ref 70–99)

## 2014-12-15 LAB — CBC
HCT: 31.5 % — ABNORMAL LOW (ref 36.0–46.0)
Hemoglobin: 10.7 g/dL — ABNORMAL LOW (ref 12.0–15.0)
MCH: 28.5 pg (ref 26.0–34.0)
MCHC: 34 g/dL (ref 30.0–36.0)
MCV: 83.8 fL (ref 78.0–100.0)
Platelets: 333 10*3/uL (ref 150–400)
RBC: 3.76 MIL/uL — ABNORMAL LOW (ref 3.87–5.11)
RDW: 13.4 % (ref 11.5–15.5)
WBC: 14.7 10*3/uL — ABNORMAL HIGH (ref 4.0–10.5)

## 2014-12-15 LAB — BASIC METABOLIC PANEL
Anion gap: 12 (ref 5–15)
Anion gap: 10 (ref 5–15)
BUN: 16 mg/dL (ref 6–20)
BUN: 14 mg/dL (ref 6–20)
CO2: 24 mmol/L (ref 22–32)
CO2: 23 mmol/L (ref 22–32)
Calcium: 8.7 mg/dL — ABNORMAL LOW (ref 8.9–10.3)
Calcium: 8.6 mg/dL — ABNORMAL LOW (ref 8.9–10.3)
Chloride: 103 mmol/L (ref 101–111)
Chloride: 101 mmol/L (ref 101–111)
Creatinine, Ser: 1.23 mg/dL — ABNORMAL HIGH (ref 0.44–1.00)
Creatinine, Ser: 1.16 mg/dL — ABNORMAL HIGH (ref 0.44–1.00)
GFR calc Af Amer: >60 mL/min (ref >60)
GFR calc Af Amer: >60 mL/min (ref >60)
GFR calc non Af Amer: 55 mL/min — ABNORMAL LOW (ref >60)
GFR calc non Af Amer: 59 mL/min — ABNORMAL LOW (ref >60)
Glucose, Bld: 172 mg/dL — ABNORMAL HIGH (ref 70–99)
Glucose, Bld: 211 mg/dL — ABNORMAL HIGH (ref 70–99)
Potassium: 3 mmol/L — ABNORMAL LOW (ref 3.5–5.1)
Potassium: 3.6 mmol/L (ref 3.5–5.1)
Sodium: 139 mmol/L (ref 135–145)
Sodium: 134 mmol/L — ABNORMAL LOW (ref 135–145)

## 2014-12-15 LAB — MAGNESIUM: Magnesium: 2 mg/dL (ref 1.7–2.4)

## 2014-12-15 MED ORDER — HYDRALAZINE HCL 20 MG/ML IJ SOLN: 10.0000 mg | INTRAMUSCULAR | Status: DC | PRN

## 2014-12-15 MED ORDER — POTASSIUM CHLORIDE 10 MEQ/100ML IV SOLN: 10.0000 meq | Freq: Once | INTRAVENOUS | Status: AC

## 2014-12-15 MED ORDER — METRONIDAZOLE 500 MG PO TABS
2000.0000 mg | ORAL_TABLET | Freq: Once | ORAL | Status: AC
  Administered 2014-12-16: 2000 mg via ORAL
  Filled 2014-12-15 (×2): qty 4

## 2014-12-15 MED ORDER — SODIUM CHLORIDE 0.9 % IV SOLN
INTRAVENOUS | Status: DC
  Administered 2014-12-15 – 2014-12-16 (×4): via INTRAVENOUS

## 2014-12-15 MED ORDER — FAMOTIDINE 20 MG PO TABS
20.0000 mg | ORAL_TABLET | Freq: Once | ORAL | Status: AC
  Administered 2014-12-15: 20 mg via ORAL
  Filled 2014-12-15: qty 1

## 2014-12-15 MED ORDER — AMLODIPINE BESYLATE 10 MG PO TABS
10.0000 mg | ORAL_TABLET | Freq: Every day | ORAL | Status: DC
  Administered 2014-12-15 – 2014-12-16 (×2): 10 mg via ORAL
  Filled 2014-12-15 (×2): qty 1

## 2014-12-15 NOTE — Progress Notes (Signed)
Subjective: Kathy Frank was transitioned off the insulin drip overnight with lantus 10 u. She says her abdominal pain has largely resolved. She says she had two episodes of emesis overnight without any blood, black, or coffee ground appearance. She ate her breakfast this morning and tolerated it well  Objective: Vital signs in last 24 hours: Filed Vitals:   12/14/14 2307 12/15/14 0258 12/15/14 0300 12/15/14 0809  BP: 159/95 184/104 184/104 190/106  Pulse: 108 103 105 112  Temp: 98.2 F (36.8 C) 98.4 F (36.9 C)  98.4 F (36.9 C)  TempSrc: Oral Oral  Oral  Resp: 22 16 24 17   Height:      Weight:      SpO2: 99% 98% 99% 97%   Weight change:   Intake/Output Summary (Last 24 hours) at 12/15/14 1021 Last data filed at 12/15/14 0700  Gross per 24 hour  Intake   1405 ml  Output      2 ml  Net   1403 ml   Gen: A&O x 4, No acute distress, well developed, well nourished HEENT: Atraumatic, PERRL, EOMI, sclerae anicteric, moist mucous membranes Heart: Regular rate and rhythm, normal S1 S2, no murmurs, rubs, or gallops Lungs: Clear to auscultation bilaterally, respirations unlabored Abd: Soft, mild epigastric tenderness, non-distended, + bowel sounds, no hepatosplenomegaly Ext: No edema or cyanosis  Lab Results: Basic Metabolic Panel:  Recent Labs Lab 12/14/14 1315  12/15/14 0045 12/15/14 0448  NA 141  < > 139 134*  K 3.4*  < > 3.0* 3.6  CL 106  < > 103 101  CO2 22  < > 24 23  GLUCOSE 369*  < > 172* 211*  BUN 15  < > 16 14  CREATININE 1.40*  < > 1.23* 1.16*  CALCIUM 9.1  < > 8.7* 8.6*  MG 1.6*  --  2.0  --   < > = values in this interval not displayed. Liver Function Tests:  Recent Labs Lab 12/14/14 0951  AST 27  ALT 16  ALKPHOS 94  BILITOT 0.5  PROT 8.5*  ALBUMIN 3.3*    Recent Labs Lab 12/14/14 0951  LIPASE 36   CBC:  Recent Labs Lab 12/14/14 0951 12/15/14 0448  WBC 11.4* 14.7*  NEUTROABS 10.3*  --   HGB 12.8 10.7*  HCT 37.2 31.5*  MCV 85.1 83.8    PLT 401* 333   CBG:  Recent Labs Lab 12/14/14 1803 12/14/14 1909 12/14/14 2018 12/14/14 2123 12/14/14 2226 12/15/14 0812  GLUCAP 161* 124* 154* 157* 178* 209*   Urine Drug Screen: Drugs of Abuse     Component Value Date/Time   LABOPIA NONE DETECTED 12/14/2014 0946   COCAINSCRNUR NONE DETECTED 12/14/2014 0946   LABBENZ NONE DETECTED 12/14/2014 0946   AMPHETMU NONE DETECTED 12/14/2014 0946   THCU POSITIVE* 12/14/2014 0946   LABBARB NONE DETECTED 12/14/2014 0946    Urinalysis:  Recent Labs Lab 12/14/14 0946  COLORURINE YELLOW  LABSPEC 1.028  PHURINE 6.0  GLUCOSEU >1000*  HGBUR LARGE*  BILIRUBINUR NEGATIVE  KETONESUR 15*  PROTEINUR >300*  UROBILINOGEN 0.2  NITRITE NEGATIVE  LEUKOCYTESUR NEGATIVE    Micro Results: Recent Results (from the past 240 hour(s))  MRSA PCR Screening     Status: None   Collection Time: 12/14/14  5:30 PM  Result Value Ref Range Status   MRSA by PCR NEGATIVE NEGATIVE Final    Comment:        The GeneXpert MRSA Assay (FDA approved for NASAL specimens only),  is one component of a comprehensive MRSA colonization surveillance program. It is not intended to diagnose MRSA infection nor to guide or monitor treatment for MRSA infections.    Studies/Results: No results found. Medications: I have reviewed the patient's current medications. Scheduled Meds: . amLODipine  10 mg Oral Daily  . buPROPion  150 mg Oral Daily  . enoxaparin (LOVENOX) injection  40 mg Subcutaneous Q24H  . insulin aspart  0-9 Units Subcutaneous TID WC  . insulin glargine  10 Units Subcutaneous QHS  . metoCLOPramide  5 mg Oral TID AC  . metroNIDAZOLE  2,000 mg Oral Once   Continuous Infusions: . sodium chloride     PRN Meds:.hydrALAZINE, ondansetron (ZOFRAN) IV, oxyCODONE-acetaminophen Assessment/Plan: Active Problems:   DKA (diabetic ketoacidoses)   Diabetic ketoacidosis   Diabetic ketoacidosis without coma associated with type 2 diabetes mellitus    Trichomoniasis   Intractable cyclical vomiting with nausea  #DKA: Kathy Jurkovich presented in DKA with glucose 582, anion gap 19, bicarbonate 20, UA with ketones. She was transitioned off the insulin drip overnight with lantus 10 u. This morning glucose 211, bicarb 23, and anion gap 10. At home she is supposed to be on metformin 1000 mg bid and lantus 15 u daily but she admits with her recent hyperemesis she has not been compliant which is likely trigger for her DKA. Last A1c 9.9 in 10/2014. This morning CBG 211. -lantus 10 u daily  -carb modified diet w SSI-sensitive after insulin transition -d/c telemetry and transfer to med-surg  #Cyclical Intractable Vomiting: Kathy Tinner has had this issue chronically. This could be due to gastroparesis as she has poorly controlled diabetes but she notes she eats several small diets as advised. It could also be cannabinoid hyperemesis syndrome as she has THC use. She told [facility] provider at last visit she had no emesis while on reglan and smoking but also today said she has not used THC in weeks but was THC positive on UDS. It is because of this vomiting that she was non-compliant and is now in DKA. At home she is on reglan 5 mg tidac -NS @ 100 cc/hr x 10 hr -reglan 5 mg po tidac -zofran 4 mg iv q4hprn  #Possible Mallory Weiss Tear: She has had much emesis and it tested positive for blood. Hemoglobin on presentation 12.8 is 10.7 this morning. However, she says she only had two episodes of emesis overnight without any blood, black, or coffee ground appearance. -cont to monitor  #HTN: BP up to 231/111 in ED, now 190/106. She has not been consistently taking her medicines for several days due to her emesis. At home she is supposed to be on amlodpine 5 mg daily (recently reduced from 10 mg due to LE swelling, lisinopril 20 mg daily. -increase amlodipine from 5 mg daily to 10 mg daily while holding ACEi in setting of AKI -hydralazine 5 mg iv q4hprn BP >190/110  #Trichomonas:  UA in ED with trichomonas. She says she has vaginal discharge that is foul smelling. She has not had sex in a month. She was educated to have any sexual partners seen by medical provider and treated. -flagyl 2 g once when can tolerate -patient instructed to notify partner so they can get treatment  #AKI: Presenting creatinine 1.41 from 0.92 on last discharge. Likely dehydration from emesis and hyperglycemia. This morning down to 1.16. -hydration as above -cont to monitor  #Microscopic hematuria: UA w many hemoglobin and 11-20 RBC/HPF. This is a chronic finding since  2010. No dysuria. At last [facility] recommend CT urography and cystoscopy -out-patient CT urography v cystoscopy  #Depression: At home she takes bupropion 150 mg daily -cont bupropion 150 mg daily  #Substance abuse: Kathy Norgren says she uses THC but not in several weeks (THC positive on UDS). She has been counseled several times that this could contribute to her cyclical emesis. -encourage cessation  Dispo: Disposition is deferred at this time, awaiting improvement of current medical problems.  Anticipated discharge in approximately 1 day(s).   The patient does have a current PCP (Rushil Sherrye Payor, MD) and does need an [facility] hospital follow-up appointment after discharge.  The patient does not have transportation limitations that hinder transportation to clinic appointments.  .Services Needed at time of discharge: Y = Yes, Blank = No PT:   OT:   RN:   Equipment:   Other:     LOS: 1 day   Kelby Aline, MD 12/15/2014, 10:21 AM

## 2014-12-15 NOTE — Progress Notes (Signed)
Pt. Arrived to the unit. Pt. Is alert and oriented with no signs of distress noted. Pt. Vitals appear stable with no skin issues noted. Educated pt. On use of staff numbers, room telephone and call bell. Call light within reach. Orders released. No further needs noted at this time.

## 2014-12-15 NOTE — Progress Notes (Signed)
Attempted to call report

## 2014-12-15 NOTE — Progress Notes (Signed)
Report called to [place] on 5W. Pt transferring to 5W02 and aware of transfer.

## 2014-12-16 LAB — BASIC METABOLIC PANEL
Anion gap: 11 (ref 5–15)
BUN: 13 mg/dL (ref 6–20)
CO2: 21 mmol/L — ABNORMAL LOW (ref 22–32)
Calcium: 8.4 mg/dL — ABNORMAL LOW (ref 8.9–10.3)
Chloride: 102 mmol/L (ref 101–111)
Creatinine, Ser: 1.04 mg/dL — ABNORMAL HIGH (ref 0.44–1.00)
GFR calc Af Amer: >60 mL/min (ref >60)
GFR calc non Af Amer: >60 mL/min (ref >60)
Glucose, Bld: 170 mg/dL — ABNORMAL HIGH (ref 70–99)
Potassium: 3.5 mmol/L (ref 3.5–5.1)
Sodium: 134 mmol/L — ABNORMAL LOW (ref 135–145)

## 2014-12-16 LAB — GLUCOSE, CAPILLARY
Glucose-Capillary: 143 mg/dL — ABNORMAL HIGH (ref 70–99)
Glucose-Capillary: 168 mg/dL — ABNORMAL HIGH (ref 70–99)

## 2014-12-16 LAB — CBC
HCT: 35.7 % — ABNORMAL LOW (ref 36.0–46.0)
Hemoglobin: 12.2 g/dL (ref 12.0–15.0)
MCH: 29 pg (ref 26.0–34.0)
MCHC: 34.2 g/dL (ref 30.0–36.0)
MCV: 84.8 fL (ref 78.0–100.0)
Platelets: 328 10*3/uL (ref 150–400)
RBC: 4.21 MIL/uL (ref 3.87–5.11)
RDW: 13.2 % (ref 11.5–15.5)
WBC: 8.4 10*3/uL (ref 4.0–10.5)

## 2014-12-16 MED ORDER — METRONIDAZOLE 500 MG PO TABS: 2000.0000 mg | ORAL_TABLET | Freq: Once | ORAL | Status: DC

## 2014-12-16 MED ORDER — LISINOPRIL 20 MG PO TABS
20.0000 mg | ORAL_TABLET | Freq: Every day | ORAL | Status: DC
  Administered 2014-12-16: 20 mg via ORAL
  Filled 2014-12-16: qty 1

## 2014-12-16 NOTE — Discharge Instructions (Signed)
1. You have a follow up appointment as follows:  [place]  Schedule an appointment as soon as possible for a visit in 1 week  [address] [place] [employer] will need a gastric emptying study as an outpatient to determine if you have gastroparesis.   2. Please take all medications as previously prescribed with the following changes:  Continue Lantus 15 units  Continue Amlodipine 5 mg daily + Lisinopril 20 mg daily  Continue Reglan 5 mg three times daily  3. If you have worsening of your symptoms or new symptoms arise, please call the clinic [phone]), or go to the ER immediately if symptoms are severe.   Diabetic Ketoacidosis Diabetic ketoacidosis (DKA) is a life-threatening complication of type 1 diabetes. It must be quickly recognized and treated. Treatment requires hospitalization. CAUSES  When there is no insulin in the body, glucose (sugar) cannot be used, and the body breaks down fat for energy. When fat breaks down, acids (ketones) build up in the blood. Very high levels of glucose and high levels of acids lead to severe loss of body fluids (dehydration) and other dangerous chemical changes. This stresses your vital organs and can cause coma or death. SIGNS AND SYMPTOMS   Tiredness (fatigue).  Weight loss.  Excessive thirst.  Ketones in your urine.  Light-headedness.  Fruity or sweet smelling breath.  Excessive urination.  Visual changes.  Confusion or irritability.  Nausea or vomiting.  Rapid breathing.  Stomachache or abdominal pain. DIAGNOSIS  Your health care provider will diagnose DKA based on your history, physical exam, and blood tests. The health care provider will check to see if you have another illness that caused you to go into DKA. Most of this will be done quickly in an emergency room. TREATMENT   Fluid replacement to correct dehydration.  Insulin.  Correction of  electrolytes, such as potassium and sodium.  Antibiotic medicines. PREVENTION  Always take your insulin. Do not skip your insulin injections.  If you are sick, treat yourself quickly. Your body often needs more insulin to fight the illness.  Check your blood glucose regularly.  Check urine ketones if your blood glucose is greater than 240 milligrams per deciliter (mg/dL).  Do not use outdated (expired) insulin.  If your blood glucose is high, drink plenty of fluids. This helps flush out ketones. HOME CARE INSTRUCTIONS   If you are sick, follow the advice of your health care provider.  To prevent dehydration, drink enough water and fluids to keep your urine clear or pale yellow.  If you cannot eat, alternate between drinking fluids with sugar (soda, juices, flavored gelatin) and salty fluids (broth, bouillon).  If you can eat, follow your usual diet and drink sugar-free liquids (water, diet drinks).  Always take your usual dose of insulin. If you cannot eat or if your glucose is getting too low, call your health care provider for further instructions.  Continue to monitor your blood or urine ketones every 3-4 hours around the clock. Set your alarm clock or have someone wake you up. If you are too sick, have someone test it for you.  Rest and avoid exercise. SEEK MEDICAL CARE IF:   You have a fever.  You have ketones in your urine, or your blood glucose is higher than a level your health care provider suggests. You may need extra insulin. Call your health care provider if you need advice on adjusting your insulin.  You  cannot drink at least a tablespoon (15 mL) of fluid every 15-20 minutes.  You have been vomiting for more than 2 hours.  You have symptoms of DKA:  Fruity smelling breath.  Breathing faster or slower.  Becoming very sleepy. SEEK IMMEDIATE MEDICAL CARE IF:   You have signs of dehydration:  Decreased urination.  Increased thirst.  Dry skin and  mouth.  Light-headedness.  Your blood glucose is very high (as advised by your health care provider) twice in a row.  You faint.  You have chest pain or trouble breathing.  You have a sudden, severe headache.  You have sudden weakness in one arm or one leg.  You have sudden trouble speaking or swallowing.  You have vomiting or diarrhea that is getting worse after 3 hours.  You have abdominal pain. MAKE SURE YOU:   Understand these instructions.  Will watch your condition.  Will get help right away if you are not doing well or get worse. Document Released: 07/24/2000 Document Revised: 08/01/2013 Document Reviewed: 01/30/2009 [facility] Patient Information 2015 [place], [place]. This information is not intended to replace advice given to you by your health care provider. Make sure you discuss any questions you have with your health care provider.

## 2014-12-16 NOTE — Progress Notes (Signed)
Pt. Received discharge instructions and prescriptions. Educated pt. On follow-up appointments. Removed IV. Received script for PO flagyl. Informed pt. That she needs to follow up PCP for pain meds. No skin issues noted. All questions answered. No further needs noted at this time.

## 2014-12-16 NOTE — Discharge Summary (Signed)
Name: Kathy Frank MRN: [id number] DOB: Jun 30, 1976 39 y.o. PCP: Riccardo Dubin, MD  Date of Admission: 12/14/2014  9:04 AM Date of Discharge: 12/16/14 Attending Physician: Madilyn Fireman, MD Discharge Diagnosis:  Active Problems:   DKA (diabetic ketoacidoses)   Diabetic ketoacidosis   Diabetic ketoacidosis without coma associated with type 2 diabetes mellitus   Trichomoniasis   Intractable cyclical vomiting with nausea  Discharge Medications:   Medication List    TAKE these medications        amLODipine 5 MG tablet  Commonly known as:  NORVASC  Take 1 tablet (5 mg total) by mouth daily.     buPROPion 150 MG 24 hr tablet  Commonly known as:  WELLBUTRIN XL  Take 1 tablet (150 mg total) by mouth daily.     docusate sodium 100 MG capsule  Commonly known as:  COLACE  Take 1 capsule (100 mg total) by mouth 2 (two) times daily as needed for mild constipation.     famotidine 20 MG tablet  Commonly known as:  PEPCID  Take 1 tablet (20 mg total) by mouth 2 (two) times daily.     Insulin Glargine 100 UNIT/ML Solostar Pen  Commonly known as:  LANTUS  Inject 15 Units into the skin daily at 10 pm.     Insulin Pen Needle 32G X 4 MM Misc  1 Units by Does not apply route daily.     lisinopril 20 MG tablet  Commonly known as:  PRINIVIL,ZESTRIL  Take 20 mg by mouth daily.     metFORMIN 1000 MG tablet  Commonly known as:  GLUCOPHAGE  Take 1 tablet (1,000 mg total) by mouth 2 (two) times daily with a meal.     metoCLOPramide 5 MG tablet  Commonly known as:  REGLAN  Take 1 tablet (5 mg total) by mouth 3 (three) times daily before meals.     metroNIDAZOLE 500 MG tablet  Commonly known as:  FLAGYL  Take 4 tablets (2,000 mg total) by mouth once.     oxyCODONE-acetaminophen 5-325 MG per tablet  Commonly known as:  PERCOCET/ROXICET  Take 1 tablet by mouth every 4 (four) hours as needed for severe pain.  Notes to Patient:  Follow up PCP for refill      traZODone 100 MG tablet   Commonly known as:  DESYREL  Take 1 tablet (100 mg total) by mouth at bedtime.        Disposition and follow-up:   Kathy Frank was discharged from [facility] in [place] condition.  At the hospital follow up visit please address:  1.  Further symptoms, glycemic control, medication compliance, THC use, treatment of partner for trichomonas  2.  Labs / imaging needed at time of follow-up: gastric emptying study, out-patient CT urography v cystoscopy, CBC, BMP  3.  Pending labs/ test needing follow-up: none  Follow-up Appointments: Follow-up Information    Follow up with [facility]. Schedule an appointment as soon as possible for a visit in 1 week.   Contact information:   [address] [place] [id number]     Admission HPI: Kathy Frank is a 39 year old woman with history of cyclical intractable vomiting, poorly controlled DM2, HTN, depression, THC use who presents with abdominal pain.   Of note, she was recently hospitalized from [id number] for her cyclical intractable vomiting thought to be gastroparesis v cannabinoid hyperemesis syndrome. She had resolution of symptoms with  reglan, zofran, and protonix. She had hospital follow-up in clinic 11/14/14 where she said she had been taking reglan with resolution of symptoms and no return with THC use.  She says that a few days ago she had return of nausea with associated vomiting and abdominal pain. She says the vomit has included episodes of dark and coffee ground appearance. She describes the pain as a sharp and crampy non-radiating epigastric pain that is worse when she eats and is nauseous. She says nothing makes the pain better. She says she has not taken her medicines regularly over this time period, including her lantus, due to her nausea. She says she has not been able to keep any food or drink down. She also notes some SOB when she is vomiting but otherwise none.  She is also urinating often and thirsty. She also notes that she has had foul smelling vaginal discharge with last sexual activity about a month ago. She says she does not drink and has not smoked THC in several months. She otherwise denies any chest pain, fevers, chills, night sweat, cough, weakness, vision changes, diarrhea, constipation.  In the ED, she had blood glucose 582 with anion gap 19. She received 1 L NS, zofran 4 mg iv once, morphine 4 mg iv once, and hydralazine 10 mg iv once for BP 231/111, prior to admission by IMTS.  Hospital Course by problem list: Active Problems:   DKA (diabetic ketoacidoses)   Diabetic ketoacidosis   Diabetic ketoacidosis without coma associated with type 2 diabetes mellitus   Trichomoniasis   Intractable cyclical vomiting with nausea   #DKA: Kathy Frank presented in DKA with glucose 582, anion gap 19, bicarbonate 20, UA with ketones. She was transitioned off the insulin drip with lantus 10 u. At home she is supposed to be on metformin 1000 mg bid and lantus 15 u daily but she admits with her recent hyperemesis she has not been compliant which is likely trigger for her DKA. Last A1c 9.9 in 10/2014. She was discharged on her home lantus 15 u daily at discharge. PCP should reassess glycemic control.  #Nausea and Vomiting: Kathy Frank has had this issue chronically. This was likely worse during this hospitalization due to her DKA as it improved with resolution of DKA. This could be due to gastroparesis as she has poorly controlled diabetes but she notes she eats several small diets as advised. It could also be cannabinoid hyperemesis syndrome as she has THC use. It is because of this vomiting that she was non-compliant and went into in DKA. At home she is on reglan 5 mg tidac which was continued. PCP can consider gastric emptying study. She has been counseled on THC cessation.  #Possible Mallory Weiss Tear: She has had much emesis and it tested positive for blood.  Hemoglobin on presentation 12.8 was stable at 12.2 prior to discharge. During hospitalization she denied any episodes of emesis with any blood, black, or coffee ground appearance. CBC should be rechecked in clinic to ensure stable hemoglobin.  #HTN: BP up to 231/111 in ED. She had not been consistently taking her medicines for several days due to her emesis. At home she is supposed to be on amlodpine 5 mg daily (recently reduced from 10 mg due to LE swelling), lisinopril 20 mg daily. Lisinopril was held in setting of AKI and amlodipine given as 10 mg daily once able to tolerate po. She was discharged back on home lisinopril 20 mg daily and amlodipine 5 mg daily.  #  Trichomonas: UA in ED with trichomonas. She says she has vaginal discharge that is foul smelling. She has not had sex in a month. She was educated to have any sexual partners seen by medical provider and treated. She received flagyl 2 g once and given a prescription for her partner for flagyl 2 g once to prevent reinfection.  #AKI: Presenting creatinine 1.41 from 0.92 on last discharge. This was likely due to dehydration from emesis and hyperglycemia. After hydration as part of DKA treatment, her creatinine was down to 1.04 prior to discharge.  #Microscopic hematuria: UA w many hemoglobin and 11-20 RBC/HPF. This is a chronic finding since 2010. No dysuria. At last [facility] visit recommend CT urography and cystoscopy. This should be considered at her hospital follow-up appointment.  #Depression: At home she takes bupropion 150 mg daily which was continued.  #Substance abuse: Kathy Frank says she uses THC but not in several weeks (THC positive on UDS). She has been counseled several times that this could contribute to her cyclical emesis. We encouraged cessation.  Discharge Vitals:   BP 157/93 mmHg  Pulse 99  Temp(Src) 98 F (36.7 C) (Oral)  Resp 15  Ht 5\' 5"  (1.651 m)  Wt 157 lb 13.6 oz (71.6 kg)  BMI 26.27 kg/m2  SpO2 97%  LMP  12/14/2014  Discharge Labs:  Basic Metabolic Panel:  Recent Labs Lab 12/14/14 1315  12/15/14 0045 12/15/14 0448 12/16/14 0535  NA 141  < > 139 134* 134*  K 3.4*  < > 3.0* 3.6 3.5  CL 106  < > 103 101 102  CO2 22  < > 24 23 21*  GLUCOSE 369*  < > 172* 211* 170*  BUN 15  < > 16 14 13   CREATININE 1.40*  < > 1.23* 1.16* 1.04*  CALCIUM 9.1  < > 8.7* 8.6* 8.4*  MG 1.6*  --  2.0  --   --   < > = values in this interval not displayed. Liver Function Tests:  Recent Labs Lab 12/14/14 0951  AST 27  ALT 16  ALKPHOS 94  BILITOT 0.5  PROT 8.5*  ALBUMIN 3.3*    Recent Labs Lab 12/14/14 0951  LIPASE 36   CBC:  Recent Labs Lab 12/14/14 0951 12/15/14 0448 12/16/14 1100  WBC 11.4* 14.7* 8.4  NEUTROABS 10.3*  --   --   HGB 12.8 10.7* 12.2  HCT 37.2 31.5* 35.7*  MCV 85.1 83.8 84.8  PLT 401* 333 328   CBG:  Recent Labs Lab 12/15/14 0812 12/15/14 1229 12/15/14 1638 12/15/14 2136 12/16/14 0759 12/16/14 1224  GLUCAP 209* 197* 198* 156* 143* 168*   Urine Drug Screen: Drugs of Abuse     Component Value Date/Time   LABOPIA NONE DETECTED 12/14/2014 0946   COCAINSCRNUR NONE DETECTED 12/14/2014 0946   LABBENZ NONE DETECTED 12/14/2014 0946   AMPHETMU NONE DETECTED 12/14/2014 0946   THCU POSITIVE* 12/14/2014 0946   LABBARB NONE DETECTED 12/14/2014 0946    Urinalysis:  Recent Labs Lab 12/14/14 0946  COLORURINE YELLOW  LABSPEC 1.028  PHURINE 6.0  GLUCOSEU >1000*  HGBUR LARGE*  BILIRUBINUR NEGATIVE  KETONESUR 15*  PROTEINUR >300*  UROBILINOGEN 0.2  NITRITE NEGATIVE  LEUKOCYTESUR NEGATIVE      Signed: Kelby Aline, MD 12/17/2014, 10:16 AM    Services Ordered on Discharge: none Equipment Ordered on Discharge: none

## 2014-12-16 NOTE — Progress Notes (Signed)
Subjective: DOing well this AM. Still w/ some nausea. Wants to go home.    Objective: Vital signs in last 24 hours: Filed Vitals:   12/15/14 1638 12/15/14 2133 12/16/14 0206 12/16/14 0600  BP: 182/104 149/99 187/100 166/97  Pulse: 102 108 96 92  Temp: 99.1 F (37.3 C) 98.6 F (37 C) 99.1 F (37.3 C) 98.4 F (36.9 C)  TempSrc: Oral Oral Oral Oral  Resp: 16 18 16 20   Height:      Weight:      SpO2: 99% 100% 93% 100%   Weight change:   Intake/Output Summary (Last 24 hours) at 12/16/14 0704 Last data filed at 12/16/14 [id number]  Gross per 24 hour  Intake 2576.67 ml  Output   1350 ml  Net 1226.67 ml   Physical Exam: General: AA female, alert, cooperative, NAD. HEENT: PERRL, EOMI. Moist mucus membranes Neck: Full range of motion without pain, supple, no lymphadenopathy or carotid bruits Lungs: Clear to ascultation bilaterally, normal work of respiration, no wheezes, rales, rhonchi Heart: RRR, no murmurs, gallops, or rubs Abdomen: Soft, non-tender, non-distended, BS + Extremities: No cyanosis, clubbing, or edema Neurologic: Alert & oriented x3, cranial nerves II-XII intact, strength grossly intact, sensation intact to light touch    Lab Results: Basic Metabolic Panel:  Recent Labs Lab 12/14/14 1315  12/15/14 0045 12/15/14 0448 12/16/14 0535  NA 141  < > 139 134* 134*  K 3.4*  < > 3.0* 3.6 3.5  CL 106  < > 103 101 102  CO2 22  < > 24 23 21*  GLUCOSE 369*  < > 172* 211* 170*  BUN 15  < > 16 14 13   CREATININE 1.40*  < > 1.23* 1.16* 1.04*  CALCIUM 9.1  < > 8.7* 8.6* 8.4*  MG 1.6*  --  2.0  --   --   < > = values in this interval not displayed.   Liver Function Tests:  Recent Labs Lab 12/14/14 0951  AST 27  ALT 16  ALKPHOS 94  BILITOT 0.5  PROT 8.5*  ALBUMIN 3.3*    Recent Labs Lab 12/14/14 0951  LIPASE 36   CBC:  Recent Labs Lab 12/14/14 0951 12/15/14 0448  WBC 11.4* 14.7*  NEUTROABS 10.3*  --   HGB 12.8 10.7*  HCT 37.2 31.5*  MCV 85.1  83.8  PLT 401* 333   CBG:  Recent Labs Lab 12/14/14 2123 12/14/14 2226 12/15/14 0812 12/15/14 1229 12/15/14 1638 12/15/14 2136  GLUCAP 157* 178* 209* 197* 198* 156*   Urine Drug Screen: Drugs of Abuse     Component Value Date/Time   LABOPIA NONE DETECTED 12/14/2014 0946   COCAINSCRNUR NONE DETECTED 12/14/2014 0946   LABBENZ NONE DETECTED 12/14/2014 0946   AMPHETMU NONE DETECTED 12/14/2014 0946   THCU POSITIVE* 12/14/2014 0946   LABBARB NONE DETECTED 12/14/2014 0946    Urinalysis:  Recent Labs Lab 12/14/14 0946  COLORURINE YELLOW  LABSPEC 1.028  PHURINE 6.0  GLUCOSEU >1000*  HGBUR LARGE*  BILIRUBINUR NEGATIVE  KETONESUR 15*  PROTEINUR >300*  UROBILINOGEN 0.2  NITRITE NEGATIVE  LEUKOCYTESUR NEGATIVE    Micro Results: Recent Results (from the past 240 hour(s))  MRSA PCR Screening     Status: None   Collection Time: 12/14/14  5:30 PM  Result Value Ref Range Status   MRSA by PCR NEGATIVE NEGATIVE Final    Comment:        The GeneXpert MRSA Assay (FDA approved for NASAL specimens only), is one  component of a comprehensive MRSA colonization surveillance program. It is not intended to diagnose MRSA infection nor to guide or monitor treatment for MRSA infections.     Medications: I have reviewed the patient's current medications. Scheduled Meds: . amLODipine  10 mg Oral Daily  . buPROPion  150 mg Oral Daily  . enoxaparin (LOVENOX) injection  40 mg Subcutaneous Q24H  . insulin aspart  0-9 Units Subcutaneous TID WC  . insulin glargine  10 Units Subcutaneous QHS  . metoCLOPramide  5 mg Oral TID AC  . metroNIDAZOLE  2,000 mg Oral Once   Continuous Infusions: . sodium chloride 100 mL/hr at 12/16/14 0444    PRN Meds:.hydrALAZINE, ondansetron (ZOFRAN) IV, oxyCODONE-acetaminophen   Assessment/Plan: 39 y/o F w/ PMHx of DM type II, HTN, gastroparesis, and depression, admitted for DKA.  DKA: Resolved. CBG's as follows:   Recent Labs Lab 12/14/14 2226  12/15/14 0812 12/15/14 1229 12/15/14 1638 12/15/14 2136  GLUCAP 178* 209* 197* 198* 156*  -Increase Lantus to 10 units. Resume 15 units on discharge.  -ISS-S  Nausea/Vomiting: Improved. Most likely related to DKA, however, patient also w/ chronic nausea and vomiting, thought to be 2/2 cyclic vomiting syndrome, however, given poorly controlled DM, also very likely to be related to gastroparesis. Started on Reglan in the clinic w/ some improvement of symptoms.  -D/c IVF -Continue Reglan 5 mg tid -Zofran 4 mg q4h prn  Mild Upper GI Bleed: Gastric contents hemoccult positive on admission, patient stating she had some coffee ground emesis. This has resolved. Most likely related to mild/small Mallory Weiss tear given intractable vomiting.  -CBC in outpatient clinic  HTN: BP improved, systolic still in the [id number] this AM.  -Restart Lisinopril 20 mg daily -Continue Norvasc 10 mg daily. Will discharge on 5 mg daily if BP can tolerate on account of her recent complaints of LE swelling in the clinic.   Trichomonas: Given 2 g Flagyl po, however, unclear if patient was able to take full dose d/t vomiting.  -Ensure adequate therapy.  -Give Rx on discharge for partner  AKI: Improved. Cr trend as follows:   Recent Labs Lab 12/14/14 1654 12/14/14 2101 12/15/14 0045 12/15/14 0448 12/16/14 0535  CREATININE 1.23* 1.24* 1.23* 1.16* 1.04*  -D/c IVF -Restart Lisinopril  Depression: Takes bupropion 150 mg daily at home -Continue bupropion 150 mg daily  Dispo: Anticipated discharge today.   The patient does have a current PCP (Rushil Sherrye Payor, MD) and does need an [facility] hospital follow-up appointment after discharge.  The patient does not have transportation limitations that hinder transportation to clinic appointments.  .Services Needed at time of discharge: Y = Yes, Blank = No PT:   OT:   RN:   Equipment:   Other:     LOS: 2 days   Corky Sox, MD 12/16/2014, 7:04 AM

## 2014-12-24 ENCOUNTER — Observation Stay (HOSPITAL_COMMUNITY)
Admission: AD | Admit: 2014-12-24 | Discharge: 2014-12-25 | Disposition: A | Discharge status: Home / Self Care | Payer: BLUE CROSS/BLUE SHIELD | Source: Ambulatory Visit | Attending: Internal Medicine | Admitting: Internal Medicine

## 2014-12-24 ENCOUNTER — Encounter (HOSPITAL_COMMUNITY): Payer: Self-pay | Admitting: General Practice

## 2014-12-24 ENCOUNTER — Ambulatory Visit (INDEPENDENT_AMBULATORY_CARE_PROVIDER_SITE_OTHER): Payer: BLUE CROSS/BLUE SHIELD | Admitting: Internal Medicine

## 2014-12-24 VITALS — BP 165/106 | HR 91 | Temp 98.0°F | Wt 164.4 lb

## 2014-12-24 DIAGNOSIS — IMO0002 Reserved for concepts with insufficient information to code with codable children: Secondary | ICD-10-CM

## 2014-12-24 DIAGNOSIS — E1129 Type 2 diabetes mellitus with other diabetic kidney complication: Secondary | ICD-10-CM

## 2014-12-24 DIAGNOSIS — E669 Obesity, unspecified: Secondary | ICD-10-CM | POA: Diagnosis present

## 2014-12-24 DIAGNOSIS — F1210 Cannabis abuse, uncomplicated: Secondary | ICD-10-CM | POA: Insufficient documentation

## 2014-12-24 DIAGNOSIS — R110 Nausea: Secondary | ICD-10-CM | POA: Diagnosis not present

## 2014-12-24 DIAGNOSIS — Z87891 Personal history of nicotine dependence: Secondary | ICD-10-CM | POA: Insufficient documentation

## 2014-12-24 DIAGNOSIS — F12929 Cannabis use, unspecified with intoxication, unspecified: Secondary | ICD-10-CM

## 2014-12-24 DIAGNOSIS — E119 Type 2 diabetes mellitus without complications: Secondary | ICD-10-CM | POA: Diagnosis present

## 2014-12-24 DIAGNOSIS — K2210 Ulcer of esophagus without bleeding: Secondary | ICD-10-CM | POA: Diagnosis not present

## 2014-12-24 DIAGNOSIS — E1165 Type 2 diabetes mellitus with hyperglycemia: Secondary | ICD-10-CM

## 2014-12-24 DIAGNOSIS — I10 Essential (primary) hypertension: Secondary | ICD-10-CM

## 2014-12-24 DIAGNOSIS — Z794 Long term (current) use of insulin: Secondary | ICD-10-CM | POA: Diagnosis not present

## 2014-12-24 DIAGNOSIS — R112 Nausea with vomiting, unspecified: Principal | ICD-10-CM | POA: Insufficient documentation

## 2014-12-24 DIAGNOSIS — K920 Hematemesis: Secondary | ICD-10-CM

## 2014-12-24 DIAGNOSIS — D649 Anemia, unspecified: Secondary | ICD-10-CM | POA: Insufficient documentation

## 2014-12-24 DIAGNOSIS — D638 Anemia in other chronic diseases classified elsewhere: Secondary | ICD-10-CM | POA: Diagnosis not present

## 2014-12-24 DIAGNOSIS — F329 Major depressive disorder, single episode, unspecified: Secondary | ICD-10-CM | POA: Diagnosis not present

## 2014-12-24 DIAGNOSIS — A599 Trichomoniasis, unspecified: Secondary | ICD-10-CM | POA: Diagnosis present

## 2014-12-24 DIAGNOSIS — K3184 Gastroparesis: Secondary | ICD-10-CM | POA: Insufficient documentation

## 2014-12-24 DIAGNOSIS — E1169 Type 2 diabetes mellitus with other specified complication: Secondary | ICD-10-CM | POA: Diagnosis present

## 2014-12-24 DIAGNOSIS — E1143 Type 2 diabetes mellitus with diabetic autonomic (poly)neuropathy: Secondary | ICD-10-CM | POA: Diagnosis not present

## 2014-12-24 HISTORY — DX: Type 2 diabetes mellitus with ketoacidosis without coma: E11.10

## 2014-12-24 LAB — GLUCOSE, CAPILLARY
Glucose-Capillary: 335 mg/dL — ABNORMAL HIGH (ref 65–99)
Glucose-Capillary: 169 mg/dL — ABNORMAL HIGH (ref 65–99)

## 2014-12-24 LAB — BASIC METABOLIC PANEL
Anion gap: 7 (ref 5–15)
BUN: 7 mg/dL (ref 6–20)
CO2: 26 mmol/L (ref 22–32)
Calcium: 9.1 mg/dL (ref 8.9–10.3)
Chloride: 103 mmol/L (ref 101–111)
Creatinine, Ser: 1.08 mg/dL — ABNORMAL HIGH (ref 0.44–1.00)
GFR calc Af Amer: >60 mL/min (ref >60)
GFR calc non Af Amer: >60 mL/min (ref >60)
Glucose, Bld: 252 mg/dL — ABNORMAL HIGH (ref 65–99)
Potassium: 4.3 mmol/L (ref 3.5–5.1)
Sodium: 136 mmol/L (ref 135–145)

## 2014-12-24 LAB — CBC
HCT: 32.1 % — ABNORMAL LOW (ref 36.0–46.0)
Hemoglobin: 10.6 g/dL — ABNORMAL LOW (ref 12.0–15.0)
MCH: 28.3 pg (ref 26.0–34.0)
MCHC: 33 g/dL (ref 30.0–36.0)
MCV: 85.8 fL (ref 78.0–100.0)
Platelets: 479 10*3/uL — ABNORMAL HIGH (ref 150–400)
RBC: 3.74 MIL/uL — ABNORMAL LOW (ref 3.87–5.11)
RDW: 13.2 % (ref 11.5–15.5)
WBC: 5.9 10*3/uL (ref 4.0–10.5)

## 2014-12-24 MED ORDER — BUPROPION HCL ER (XL) 150 MG PO TB24
150.0000 mg | ORAL_TABLET | Freq: Every day | ORAL | Status: DC
  Administered 2014-12-25: 150 mg via ORAL
  Filled 2014-12-24 (×2): qty 1

## 2014-12-24 MED ORDER — PANTOPRAZOLE SODIUM 40 MG PO TBEC
40.0000 mg | DELAYED_RELEASE_TABLET | Freq: Every day | ORAL | Status: DC
  Administered 2014-12-24 – 2014-12-25 (×2): 40 mg via ORAL
  Filled 2014-12-24 (×2): qty 1

## 2014-12-24 MED ORDER — SODIUM CHLORIDE 0.9 % IV SOLN
INTRAVENOUS | Status: AC
  Administered 2014-12-24 – 2014-12-25 (×2): via INTRAVENOUS

## 2014-12-24 MED ORDER — METOCLOPRAMIDE HCL 5 MG PO TABS
5.0000 mg | ORAL_TABLET | Freq: Three times a day (TID) | ORAL | Status: DC
  Administered 2014-12-24 – 2014-12-25 (×3): 5 mg via ORAL
  Filled 2014-12-24 (×5): qty 1

## 2014-12-24 MED ORDER — INSULIN GLARGINE 100 UNIT/ML ~~LOC~~ SOLN
10.0000 [IU] | Freq: Every day | SUBCUTANEOUS | Status: DC
  Administered 2014-12-24: 10 [IU] via SUBCUTANEOUS
  Filled 2014-12-24 (×2): qty 0.1

## 2014-12-24 MED ORDER — FAMOTIDINE 20 MG PO TABS
20.0000 mg | ORAL_TABLET | Freq: Two times a day (BID) | ORAL | Status: DC
  Administered 2014-12-24: 20 mg via ORAL
  Filled 2014-12-24 (×2): qty 1

## 2014-12-24 MED ORDER — AMLODIPINE BESYLATE 5 MG PO TABS
5.0000 mg | ORAL_TABLET | Freq: Every day | ORAL | Status: DC
  Administered 2014-12-25: 5 mg via ORAL
  Filled 2014-12-24 (×2): qty 1

## 2014-12-24 MED ORDER — ENOXAPARIN SODIUM 40 MG/0.4ML ~~LOC~~ SOLN
40.0000 mg | SUBCUTANEOUS | Status: DC
  Administered 2014-12-24: 40 mg via SUBCUTANEOUS
  Filled 2014-12-24 (×2): qty 0.4

## 2014-12-24 MED ORDER — DOCUSATE SODIUM 100 MG PO CAPS: 100.0000 mg | ORAL_CAPSULE | Freq: Two times a day (BID) | ORAL | Status: DC | PRN

## 2014-12-24 MED ORDER — PROMETHAZINE HCL 25 MG PO TABS: 12.5000 mg | ORAL_TABLET | Freq: Four times a day (QID) | ORAL | Status: DC | PRN

## 2014-12-24 MED ORDER — INSULIN ASPART 100 UNIT/ML ~~LOC~~ SOLN
0.0000 [IU] | Freq: Three times a day (TID) | SUBCUTANEOUS | Status: DC
Start: 2014-12-24 — End: 2014-12-25
  Administered 2014-12-24: 7 [IU] via SUBCUTANEOUS
  Administered 2014-12-25: 3 [IU] via SUBCUTANEOUS
  Administered 2014-12-25: 2 [IU] via SUBCUTANEOUS

## 2014-12-24 MED ORDER — TRAZODONE HCL 100 MG PO TABS
100.0000 mg | ORAL_TABLET | Freq: Every day | ORAL | Status: DC
  Administered 2014-12-24: 100 mg via ORAL
  Filled 2014-12-24 (×2): qty 1

## 2014-12-24 NOTE — Progress Notes (Signed)
Subjective:   Patient ID: Kathy Frank female   DOB: 02/26/1976 39 y.o.   MRN: [id number]  HPI: [name] is a 39 y.o. woman with a past mental history as listed below who presents for hospital follow-up.  The patient was discharged on 12/16/14 for DKA and irretractable nausea and vomiting. Since that time the patient states that she has felt the same or worse. She has continued to have daily vomiting that has prevented her from keeping down her medications. She states that she mostly vomits anywhere between one to 2 hours after eating and has only been able to tolerate soft foods such as bananas. She has continued to take her insulin even when she is having trouble eating. She has not noticed any hypoglycemic events or had any syncope or presyncopal feelings. She has abstained from any further THC use but has continued to have cyclical vomiting with episodes of coffee-ground emesis. She denies any bright red blood per rectum or dark tarry stools. She has been taking Goody powders daily up to 2-3 packets to control the abdominal pain without relief.   Past Medical History  Diagnosis Date  . Depression   . Type II diabetes mellitus     diagnosed around 1994  . Hypertension   . Gastroparesis     ? diabetic  . Cyclical vomiting     THC induced???   Current Outpatient Prescriptions  Medication Sig Dispense Refill  . amLODipine (NORVASC) 5 MG tablet Take 1 tablet (5 mg total) by mouth daily. 30 tablet 2  . buPROPion (WELLBUTRIN XL) 150 MG 24 hr tablet Take 1 tablet (150 mg total) by mouth daily. 30 tablet 2  . docusate sodium (COLACE) 100 MG capsule Take 1 capsule (100 mg total) by mouth 2 (two) times daily as needed for mild constipation. 30 capsule 0  . famotidine (PEPCID) 20 MG tablet Take 1 tablet (20 mg total) by mouth 2 (two) times daily. 180 tablet 1  . Insulin Glargine (LANTUS) 100 UNIT/ML Solostar Pen Inject 15 Units into the skin daily at 10 pm. 15 mL 5  . Insulin Pen  Needle 32G X 4 MM MISC 1 Units by Does not apply route daily. 100 each 2  . lisinopril (PRINIVIL,ZESTRIL) 20 MG tablet Take 20 mg by mouth daily.   6  . metFORMIN (GLUCOPHAGE) 1000 MG tablet Take 1 tablet (1,000 mg total) by mouth 2 (two) times daily with a meal. 60 tablet 5  . metoCLOPramide (REGLAN) 5 MG tablet Take 1 tablet (5 mg total) by mouth 3 (three) times daily before meals. 90 tablet 1  . metroNIDAZOLE (FLAGYL) 500 MG tablet Take 4 tablets (2,000 mg total) by mouth once. 4 tablet 0  . oxyCODONE-acetaminophen (PERCOCET/ROXICET) 5-325 MG per tablet Take 1 tablet by mouth every 4 (four) hours as needed for severe pain.    . traZODone (DESYREL) 100 MG tablet Take 1 tablet (100 mg total) by mouth at bedtime. 30 tablet 2   No current facility-administered medications for this visit.   No family history on file. History   Social History  . Marital Status: Single    Spouse Name: N/A  . Number of Children: N/A  . Years of Education: 10   Occupational History  .  Unemployed   Social History Main Topics  . Smoking status: Former Smoker -- 0.10 packs/day for 10 years    Types: Cigarettes    Start date: 11/17/2013    Quit date: 09/09/2014  .  Smokeless tobacco: Never Used  . Alcohol Use: Yes     Comment: 11/13/2013 "occasionally; once or twice/month"  . Drug Use: Yes    Special: Marijuana  . Sexual Activity: Not Currently   Other Topics Concern  . Not on file   Social History Narrative   Review of Systems: Pertinent items are noted in HPI. Objective:  Physical Exam: Filed Vitals:   12/24/14 0929  BP: 182/104  Pulse: 91  Temp: 98 F (36.7 C)  TempSrc: Oral  Weight: 164 lb 6.4 oz (74.571 kg)  SpO2: 100%   General: sitting in chair, uncomfortable  HEENT: no scleral icterus Cardiac: RRR, no rubs, murmurs or gallops Pulm: clear to auscultation bilaterally, no crackles wheezes or rhonchi, moving normal volumes of air Abd: soft, very tender in epigastric with radiation to  RUQ and LUQ even with distraction, no rebound or guarding, nondistended, hyperactive BS present Ext: warm and well perfused, no pedal edema Neuro: alert and oriented X3, cranial nerves II-XII grossly intact  Assessment & Plan:  Please see problem oriented charting  Pt discussed with Dr. Daryll Drown

## 2014-12-24 NOTE — Assessment & Plan Note (Signed)
Pt taking insulin but having trouble keeping good po intake. Urgent issue of UGIB for admission.

## 2014-12-24 NOTE — H&P (Signed)
Date: 12/24/2014              Patient Name:  Kathy Frank MRN: [id number]  DOB: 03-07-1976 Age / Sex: 39 y.o. female   PCP: Riccardo Dubin, MD         Medical Service: Internal Medicine Teaching Service         Attending Physician: Dr. Madilyn Fireman, MD    First Contact: Dr. Ronnald Ramp, MD Pager: [phone]  Second Contact: Dr. Posey Pronto, MD Pager: [phone]       After Hours (After 5p/  Dr. Ronnald Ramp, MD Pager: [phone]  weekends / holidays): Dr. Tamala Julian, MD Pager: [phone]   Chief Complaint: Low Hbg levels, coffee-ground emesis  History of Present Illness:  Kathy Frank is a 39 yo F with a PMH of history of cyclical intractable vomiting, poorly controlled DM2, HTN, depression and THC use who was referred to the [facility] Internal Medicine department from the [facility] on 05/16 for low hemoglobin levels and a history of chronic vomiting with coffee-ground emesis concerning for GI bleed. Patient is currently taking home medications of reglan, zofran and protonix for her nausea and vomiting, and states that medications improves her symptoms. However, she states that she continues to vomit at least 2-3 days per week. Patient states that vomiting does not occur with any stressors including food or beverage but is usually preceded by epigastric discomfort, feeling of fullness and nausea. Patient states that vomit usually appears like the food or beverage she just recently consumed, but has had occasional episodes where she has seen coffee-ground pieces in her vomit. Her last coffee-ground emesis was 1 week ago, and has vomited 2-3x since. Patient states that epigastric discomfort does not radiate to back, chest or lower abdomen. Patient denies dysphagia, odynophagia, orthostatic hypotension, dizziness, headaches, weakness, recent weight loss/gain or changes in stool appearance, amount or frequency. Patient also denies recent illness or contact with sick individuals.   Patient was recently hospitalized on  03/31-04/01 and 0000000 for cyclical intractable vomiting and was thought to be due to gastroparesis vs. Cannabinoid hyperemesis syndrome. On both admissions, patient was discharged without a clear etiology but was symptomatically controlled with reglan, zofran and protonix. The team considered gastric emptying study or EGD as possible diagnostic tests if patient continues to have further issues after discharge.  Patient's last menstrual cycle was earlier this month. Patient reports having heavy menstrual periods, with using 6-7 pads on her heaviest day. Menstruation lasts between 7-8 days, and occurs once a month.   Meds: Scheduled Medications . amLODipine  5 mg Oral Daily  . buPROPion  150 mg Oral Daily  . enoxaparin (LOVENOX) injection  40 mg Subcutaneous Q24H  . famotidine  20 mg Oral BID  . insulin aspart  0-9 Units Subcutaneous TID WC  . insulin glargine  10 Units Subcutaneous QHS  . metoCLOPramide  5 mg Oral TID AC  . traZODone  100 mg Oral QHS   Scheduled Medications docusate sodium, promethazine   Continuous medications 0.9 %  sodium chloride infusion, , Intravenous, Continuous, Corky Sox, MD, Last Rate: 100 mL/hr at 12/24/14 1515   Current Outpatient Prescriptions  Medication Sig Dispense Refill  . amLODipine (NORVASC) 5 MG tablet Take 1 tablet (5 mg total) by mouth daily. 30 tablet 2  . buPROPion (WELLBUTRIN XL) 150 MG 24 hr tablet Take 1 tablet (150 mg total) by mouth daily. 30 tablet 2  . docusate sodium (COLACE) 100 MG capsule Take 1 capsule (  100 mg total) by mouth 2 (two) times daily as needed for mild constipation. 30 capsule 0  . famotidine (PEPCID) 20 MG tablet Take 1 tablet (20 mg total) by mouth 2 (two) times daily. 180 tablet 1  . Insulin Glargine (LANTUS) 100 UNIT/ML Solostar Pen Inject 15 Units into the skin daily at 10 pm. (Patient taking differently: Inject 15 Units into the skin at bedtime. ) 15 mL 5  . lisinopril (PRINIVIL,ZESTRIL) 20 MG tablet Take 20  mg by mouth daily.   6  . metFORMIN (GLUCOPHAGE) 1000 MG tablet Take 1 tablet (1,000 mg total) by mouth 2 (two) times daily with a meal. 60 tablet 5  . metoCLOPramide (REGLAN) 5 MG tablet Take 1 tablet (5 mg total) by mouth 3 (three) times daily before meals. (Patient taking differently: Take 5 mg by mouth 4 (four) times daily -  before meals and at bedtime. ) 90 tablet 1  . oxyCODONE-acetaminophen (PERCOCET/ROXICET) 5-325 MG per tablet Take 1 tablet by mouth every 4 (four) hours as needed for severe pain.    . traZODone (DESYREL) 100 MG tablet Take 1 tablet (100 mg total) by mouth at bedtime. (Patient taking differently: Take 100 mg by mouth at bedtime as needed for sleep. ) 30 tablet 2  . Insulin Pen Needle 32G X 4 MM MISC 1 Units by Does not apply route daily. 100 each 2  . metroNIDAZOLE (FLAGYL) 500 MG tablet Take 4 tablets (2,000 mg total) by mouth once. (Patient not taking: Reported on 12/24/2014) 4 tablet 0   Allergies: Allergies as of 12/24/2014  . (No Known Allergies)   Past Medical History Past Medical History  Diagnosis Date  . Depression   . Type II diabetes mellitus     diagnosed around 1994  . Hypertension   . Gastroparesis     ? diabetic  . Cyclical vomiting     THC induced???  . DKA (diabetic ketoacidoses) 12/2014   Past Surgical History  Procedure Laterality Date  . Tubal ligation  2002   Family History: Mother: Diabetic, currently living  Social History: Kathy Frank is currently living with her mother in [place]. She is single and does not have any children. She has a high school education and is currently unemployed. She is also 1 pack/year smoker and reports that she smokes 0.1 pack/day for 10 years. She occasionally smokes marijuana but denies any other recreational drug use. Patient is currently not sexually active.   Review of Systems: Review of Systems - History obtained from the patient General ROS: Patient denies fever, chills, sleep disturbance or  recent weight loss/gain. Ophthalmic ROS: Patient denies changes in vision. ENT ROS: Patient denies headaches, dizziness, dysphagia or odynophagia, oral lesions or sore throat. Allergy and Immunology ROS: positive for itchy/watery eyes, nasal congestion, postnasal drip and seasonal allergies Respiratory ROS: no cough, shortness of breath, or wheezing Cardiovascular ROS: no chest pain, palpitations, dyspnea on exertion Vascular ROS: denies orthostatic hypotension Gastrointestinal ROS: positive for abdominal pain, appetite loss, nausea/vomiting 2-3 a week.  Genitourinary ROS: positive for heavy periods (6-7 pads at heaviest day) and long duration of periods (7-8 days). Musculoskeletal ROS: negative for join pain, joint stiffness, muscle pain or muscle weakness.  Physical Exam: General appearance: Ms. Suto is a pleasant 39 yo F who was alert, cooperative and in no distress during exam. Eyes: conjunctivae clear, no icterus. Throat: moist mucosa. Normal tongue, teeth and gums. Back: no kyphosis nor scoliosis present, No CVA tenderness Lungs: clear to  auscultation bilaterally Heart: regular rate and rhythm Abdomen: Positive tenderness in epigastric region. Non-distended, soft with normal bowel sounds. No guarding or rebound. No hepatomegaly Extremities: extremities normal, no edema, redness or tenderness in the calves or thighs Lymph nodes: Cervical, supraclavicular, and axillary nodes normal.  Lab results: CBC Latest Ref Rng 12/24/2014 12/16/2014 12/15/2014  WBC 4.0 - 10.5 K/uL 5.9 8.4 14.7(H)  Hemoglobin 12.0 - 15.0 g/dL 10.6(L) 12.2 10.7(L)  Hematocrit 36.0 - 46.0 % 32.1(L) 35.7(L) 31.5(L)  Platelets 150 - 400 K/uL 479(H) 328 333    Latest Ref Rng 12/24/2014 12/16/2014 12/15/2014  Glucose 65 - 99 mg/dL 252(H) 170(H) 211(H)  BUN 6 - 20 mg/dL 7 13 14   Creatinine 0.44 - 1.00 mg/dL 1.08(H) 1.04(H) 1.16(H)  Sodium 135 - 145 mmol/L 136 134(L) 134(L)  Potassium 3.5 - 5.1 mmol/L 4.3 3.5 3.6  Chloride  101 - 111 mmol/L 103 102 101  CO2 22 - 32 mmol/L 26 21(L) 23  Calcium 8.9 - 10.3 mg/dL 9.1 8.4(L) 8.6(L)   Imaging results:  No images were performed on admission  Assessment & Plan by Problem:  Ms. Waight is a 39 yo F with a PMH of history of poorly controlled DM2 and multiple hospital admissions for cyclical intractable vomiting who was referred to the [facility] Internal Medicine department from the [facility] on 05/16 for low hemoglobin levels and a history of chronic vomiting with episodes of coffee-ground emesis concerning for GI bleed.   Chronic Vomiting: Differential diagnosis for Ms. Delashmit's vomiting includes gastroparesis or cannaboid hyperemesis syndrome. Cannaboid hyperemesis syndrome is a possibility due to patient's history of THC use. However, gastroparesis is a more likely diagnosis due to patient's history of uncontrolled DM2 and her clinical history of a smoker with chronic nausea, vomiting, abdominal pain and feeling of fullness makes gastroparesis a more likely diagnosis. A gastric emptying scan will be considered to establish diagnosis. -Continue home medication of metoclopromide for nausea control. -F/u with gastric emptying study. Possibly performed in outpatient setting.   Coffee-ground emesis: Differential diagnosis for Ms. Dineen's coffee-ground emesis includes mallory weiss tear, esophageal stricture or gastric/duodenal ulcer from dyspepsia. Mallory weiss tear was considered as a possible diagnosis during patient's admission between 05/06-05/08 but EGD was suggested for outpatient work up after discharge. Esophageal strictures as a complication of undiagnosed GERD. Patient's symptoms of nausea, epigastric pain and decreased hunger are less-common symptoms of GERD that could have caused undiagnosis. Gastric or duodenal ulcers as a result from emotional stress and aspirin use could also cause coffee-ground emesis. -Patient will continue home meds of famotidine.  -GI was  called for consult to perform EGD. -Appreciate GI recommendations, GI did not recommend EGD at this time.   Normocytic Anemia: Ms. Whitsel today showed WBC: 5.9, MCV 85.8, Hbg 10.6, Hct 32.1, Plt 479. There is currently a trend on patient's chart which shows patient's Hbg fluctuating between 10 and 12 on encounters since 12/2013. Ms. Polivka last menstrual cycle was earlier this month. Menstrual cycle is normally heavy (6-7 pads/day at heaviest) and lasts for 7-8 days. Patient's current decrease in hemoglobin and hematocrit is most likely due to menstrual cycle. This could also be exacerbated with patient's episode of coffee-ground emesis last week.  DM2: Ms. Hargens's capillary blood glucose was 335 and blood glucose of 252 today. Patient is continued on home medications of novolog and lantus during stay.  Depression: Patient will continue home medications of trazodone and wellbutrin PO daily.   Dispo: Disposition is deferred  at this time, awaiting improvement of current medical problems. Anticipated discharge in approximately 1 day.   Ms. Lautz is currently a patient at [facility]. Dr. Charlott Rakes, MD is her current PCP. Patient will follow up with Dr. Posey Pronto in one month after discharge. She does not have transportation limitations that hinder transportation to clinic appointments.  Signed: Carla Drape, Med Student 12/24/2014 5:20 PM

## 2014-12-24 NOTE — Consult Note (Signed)
Consultation  Referring Provider: TInternal Medicine Teaching Service     Primary Care Physician:  Charlott Rakes, MD Primary Gastroenterologist:         Reason for Consultation:  Hematemesis.            HPI:   Kathy Frank is a 39 y.o. female with diabetes and recent history of DKA. Patient has chronic nausea and vomiting. During hospitalization for DKA earlier this month patient began having coffee ground emesis. This continued upon discharge. She didn't get Percocet refill and since admission has been taking Goody powders. Last episode of coffee grounds was a few days back. She denies black stools. Patient admitted from the outpatient clinic today for the complaints of coffee ground emesis and minor drop in hgb. She denies GERD but takes Pepcid 3 times / week to help with coughing. She takes a stool softener everyday.   Past Medical History  Diagnosis Date  . Depression   . Type II diabetes mellitus     diagnosed around 1994  . Hypertension   . Gastroparesis     ? diabetic  . Cyclical vomiting     THC induced???    Past Surgical History  Procedure Laterality Date  . Tubal ligation  2002     [place]: Denies colon or gastric cancer. No liver disease.   History  Substance Use Topics  . Smoking status: Former Smoker -- 0.10 packs/day for 10 years    Types: Cigarettes    Start date: 11/17/2013    Quit date: 09/09/2014  . Smokeless tobacco: Never Used  . Alcohol Use: Yes     Comment: 11/13/2013 "occasionally; once or twice/month"    Prior to Admission medications   Medication Sig Start Date End Date Taking? Authorizing Provider  amLODipine (NORVASC) 5 MG tablet Take 1 tablet (5 mg total) by mouth daily. 11/14/14 11/14/15  Lucious Groves, DO  buPROPion (WELLBUTRIN XL) 150 MG 24 hr tablet Take 1 tablet (150 mg total) by mouth daily. 11/24/13   Cresenciano Genre, MD  docusate sodium (COLACE) 100 MG capsule Take 1 capsule (100 mg total) by mouth 2 (two) times daily as needed for mild  constipation. 11/09/14   Kelby Aline, MD  famotidine (PEPCID) 20 MG tablet Take 1 tablet (20 mg total) by mouth 2 (two) times daily. 12/11/14   Rushil Sherrye Payor, MD  Insulin Glargine (LANTUS) 100 UNIT/ML Solostar Pen Inject 15 Units into the skin daily at 10 pm. 11/14/14   Lucious Groves, DO  Insulin Pen Needle 32G X 4 MM MISC 1 Units by Does not apply route daily. 11/14/14   Lucious Groves, DO  lisinopril (PRINIVIL,ZESTRIL) 20 MG tablet Take 20 mg by mouth daily.  10/11/14   Historical Provider, MD  metFORMIN (GLUCOPHAGE) 1000 MG tablet Take 1 tablet (1,000 mg total) by mouth 2 (two) times daily with a meal. 11/14/14 11/14/15  Lucious Groves, DO  metoCLOPramide (REGLAN) 5 MG tablet Take 1 tablet (5 mg total) by mouth 3 (three) times daily before meals. 12/11/14   Rushil Sherrye Payor, MD  metroNIDAZOLE (FLAGYL) 500 MG tablet Take 4 tablets (2,000 mg total) by mouth once. 12/16/14   Corky Sox, MD  oxyCODONE-acetaminophen (PERCOCET/ROXICET) 5-325 MG per tablet Take 1 tablet by mouth every 4 (four) hours as needed for severe pain.    Historical Provider, MD  traZODone (DESYREL) 100 MG tablet Take 1 tablet (100 mg total) by mouth at bedtime. 11/24/13  Cresenciano Genre, MD        Goody Powders 4-5 times a week  Current Facility-Administered Medications  Medication Dose Route Frequency Provider Last Rate Last Dose  . 0.9 %  sodium chloride infusion   Intravenous Continuous Corky Sox, MD      . amLODipine (NORVASC) tablet 5 mg  5 mg Oral Daily Corky Sox, MD      . buPROPion (WELLBUTRIN XL) 24 hr tablet 150 mg  150 mg Oral Daily Corky Sox, MD      . docusate sodium (COLACE) capsule 100 mg  100 mg Oral BID PRN Corky Sox, MD      . enoxaparin (LOVENOX) injection 40 mg  40 mg Subcutaneous Q24H Corky Sox, MD      . famotidine (PEPCID) tablet 20 mg  20 mg Oral BID Corky Sox, MD      . insulin aspart (novoLOG) injection 0-9 Units  0-9 Units Subcutaneous TID WC Corky Sox, MD      . insulin glargine  (LANTUS) injection 10 Units  10 Units Subcutaneous QHS Corky Sox, MD      . metoCLOPramide (REGLAN) tablet 5 mg  5 mg Oral TID AC Corky Sox, MD      . promethazine (PHENERGAN) tablet 12.5 mg  12.5 mg Oral Q6H PRN Corky Sox, MD      . traZODone (DESYREL) tablet 100 mg  100 mg Oral QHS Corky Sox, MD        Allergies as of 12/24/2014  . (No Known Allergies)    Review of Systems:    All systems reviewed and negative except where noted in HPI.    Physical Exam:  Vital signs in last 24 hours: Temp:  [98 F (36.7 C)-98.9 F (37.2 C)] 98.9 F (37.2 C) (05/16 1227) Pulse Rate:  [91-101] 101 (05/16 1227) Resp:  [18] 18 (05/16 1227) BP: (152-182)/(95-106) 152/95 mmHg (05/16 1227) SpO2:  [97 %-100 %] 97 % (05/16 1227) Weight:  [162 lb 11.2 oz (73.8 kg)-164 lb 6.4 oz (74.571 kg)] 162 lb 11.2 oz (73.8 kg) (05/16 1227)   General:   Pleasant black female in NAD Head:  Normocephalic and atraumatic. Eyes:   No icterus.   Conjunctiva pink. Ears:  Normal auditory acuity. Neck:  Supple; no masses felt Lungs:  Respirations even and unlabored. Lungs clear to auscultation bilaterally.   No wheezes, crackles, or rhonchi.  Heart:  Regular rate and rhythmAbdomen:  Soft,  ondistended, nontender. Normal bowel sounds. No appreciable masses or hepatomegaly.  Rectal:  Light brown stool in vault.  Msk:  Symmetrical without gross deformities.  Extremities:  Without edema. Neurologic:  Alert and  oriented x4;  grossly normal neurologically. Skin:  Intact without significant lesions or rashes. Cervical Nodes:  No significant cervical adenopathy. Psych:  Alert and cooperative. Normal affect.  LAB RESULTS:  Recent Labs  12/24/14 0959  WBC 5.9  HGB 10.6*  HCT 32.1*  PLT 479*   BMET  Recent Labs  12/24/14 0959  NA 136  K 4.3  CL 103  CO2 26  GLUCOSE 252*  BUN 7  CREATININE 1.08*  CALCIUM 9.1   PREVIOUS ENDOSCOPIES:            none   Impression / Plan:   39 year old black  female with chronic nausea and vomiting. She has longstanding diabetes, suspect diabetic gastroparesis. She is admitted with complaints of coffee ground emesis and  dark stool. Patient denies dark stool but reports coffee ground emesis since admission for DKA earlier this month. BUN is normal, only a trivial drop in hgb. Coffee ground emesis could be secondary to esophagitis from vomiting or even NSAID induced gastropathy. Mallory Weiss tear possible but seems unlikely. No coffee ground emesis since Thursday. -Would avoid NSAIDs - daily PPI for 4 weeks. - Optimize blood sugars.  - Continue home Reglan before meals and at bedtime. Could switch to liquid form, may be absorbed better. -   Small frequent meals may be helpful.  - Minimizing use of narcotics especially important.    Thanks   LOS: 0 days   Tye Savoy  12/24/2014, 2:47 PM  GI ATTENDING  History, laboratories, x-rays reviewed. Patient seen and examined. Admitted with minor coffee ground emesis. Has had previously. No significant GI bleeding. Suspect secondary to retching gastropathy or esophagitis. Agree with treatment with PPI. Antiemetics as needed. Good blood sugar control most important. Advance diet as tolerated. Avoid NSAIDs. No plans for endoscopy and let she were to develop more clinically significant bleeding. We're available if needed. Will sign off.  Docia Chuck. Geri Seminole., M.D. [facility] Division of Gastroenterology

## 2014-12-24 NOTE — H&P (Signed)
Date: 12/24/2014               Patient Name:  Kathy Frank MRN: [id number]  DOB: 12-Nov-1975 Age / Sex: 39 y.o., female   PCP: Kathy Dubin, MD         Medical Service: Internal Medicine Teaching Service         Attending Physician: Dr. Madilyn Fireman, MD    First Contact: Dr. Posey Frank  Pager: [phone]   Second Contact: Dr. Ronnald Frank  Pager: [phone]        After Hours (After 5p/  First Contact Pager: [phone]  weekends / holidays): Second Contact Pager: [phone]   Chief Complaint: Vomiting  History of Present Illness: Kathy Frank is a 39 year old female with history of cyclical nausea/vomiting thought to be secondary to marijuana abuse, diabetes, hypertension, depression who presented to clinic today with a recent episode of coffee-ground emesis.   This morning, Kathy Frank presented to clinic for her follow-up visit from most recent hospitalization for DKA and reported having an episode of coffee-ground emesis last week. Hemoglobin in clinic was found to be 10.6, decreased from 12.2 at the time of discharge. Given the concern of an acute upper GI bleed as there was a concern for a possible Mallory-Weiss tear at her last hospitalization, Kathy Frank was admitted to the hospital for further workup.  At baseline, Kathy Frank reports vomiting every other day and cannot report any inciting trigger though does note that when Kathy Frank does not eat her first meal at a regular time that it often incites vomiting. Kathy Frank describes her vomitus is mostly whatever Kathy Frank has "in her stomach." It is also associated with sharp, crampy epigastric abdominal pain which is not associated with eating or any other activity. Kathy Frank denies any dysphagia, worsening appetite, weight loss, melena, hematochezia, dizziness. Her last menstrual period began after her most recent hospitalization typically runs for 7-8 days with 6 pads used on the heaviest day. Of note, Kathy Frank has been taking BC powder 1-2 packets per day since Kathy Frank ran out of her Percocet some weeks  ago and reports adherence to Reglan 5 mg 3 times daily with Zofran 1-2 times daily as needed for nausea, vomiting. Kathy Frank otherwise denies chest pain, shortness of breath, recent sick contacts. For her diabetes, Kathy Frank takes Lantus 15 units at night and reports that her sugars typically run in the 200s throughout the day. Kathy Frank is currently unemployed and lives at home with her mom. Kathy Frank denies any recent tobacco or marijuana use and notes that Kathy Frank could not tolerate excessive alcohol as it often incites her vomiting; her most recent alcohol consumption was 3 days ago though within the amount Kathy Frank can tolerate.  Meds: No current facility-administered medications for this encounter.    Allergies: Allergies as of 12/24/2014  . (No Known Allergies)   Past Medical History  Diagnosis Date  . Depression   . Type II diabetes mellitus     diagnosed around 1994  . Hypertension   . Gastroparesis     ? diabetic  . Cyclical vomiting     THC induced???   Past Surgical History  Procedure Laterality Date  . Tubal ligation  2002   No family history on file. History   Social History  . Marital Status: Single    Spouse Name: N/A  . Number of Children: N/A  . Years of Education: 10   Occupational History  .  Unemployed   Social History Main Topics  . Smoking status: Former Smoker --  0.10 packs/day for 10 years    Types: Cigarettes    Start date: 11/17/2013    Quit date: 09/09/2014  . Smokeless tobacco: Never Used  . Alcohol Use: Yes     Comment: 11/13/2013 "occasionally; once or twice/month"  . Drug Use: Yes    Special: Marijuana  . Sexual Activity: Not Currently   Other Topics Concern  . Not on file   Social History Narrative    Review of Systems: As noted in the history of present illness  Physical Exam: Blood pressure 152/95, pulse 101, temperature 98.9 F (37.2 C), temperature source Oral, resp. rate 18, height 5\' 5"  (1.651 m), weight 162 lb 11.2 oz (73.8 kg), last menstrual period  12/14/2014, SpO2 97 %. General: Overweight African-American female, resting in bed, NAD HEENT: PERRL, EOMI, no scleral icterus, no conjunctival pallor noted, oropharynx clear with moist mucous membranes Cardiac: Tachycardic, no rubs, murmurs or gallops Pulm: clear to auscultation bilaterally in the posterior lung fields, no wheezes, rales, or rhonchi Abd: soft, nontender, nondistended, BS present, mild tenderness noted in right upper quadrant when attempting to elicit Murphy's sign Ext: warm and well perfused, no pedal or tibial edema, multiple tattoos noted on all extremities Neuro: CN II-XII intact, 5/5 upper and lower extremity strength, 2+ grip strength    Lab results: Basic Metabolic Panel:  Recent Labs  12/24/14 0959  NA 136  K 4.3  CL 103  CO2 26  GLUCOSE 252*  BUN 7  CREATININE 1.08*  CALCIUM 9.1   CBC:  Recent Labs  12/24/14 0959  WBC 5.9  HGB 10.6*  HCT 32.1*  MCV 85.8  PLT 479*   Urine Drug Screen: Drugs of Abuse     Component Value Date/Time   LABOPIA NONE DETECTED 12/14/2014 0946   COCAINSCRNUR NONE DETECTED 12/14/2014 0946   LABBENZ NONE DETECTED 12/14/2014 0946   AMPHETMU NONE DETECTED 12/14/2014 0946   THCU POSITIVE* 12/14/2014 0946   LABBARB NONE DETECTED 12/14/2014 0946      Assessment & Plan by Problem:  [name] is a 39 year old female with history of chronic nausea/vomiting thought to be secondary to marijuana abuse, diabetes, hypertension, depression who presented to clinic today with a recent episode of coffee-ground emesis.   Coffee-ground emesis: Given her frequency of vomiting, Mallory-Weiss tear is quite possible and was noted at her most recent hospitalization. Her ongoing use of BC powder does put her at risk for acute gastritis and peptic ulcer disease. Kathy Frank tested negative for HIV at her most recent hospitalization which does exclude infectious causes of erosive esophagitis, like CMV, HSV, Candida. No stigmata of liver disease on  physical exam with most recent LFTs within normal limits on 12/14/14 to suggest variceal bleeding in the setting of end-stage liver disease. Abdominal ultrasound in December 2014 also without findings consistent with cirrhosis. Though hemoglobin was noted to be lower today, it is consistent with her her hemoglobin trend of 10-12 at her last hospitalization. Aside from tachycardia, Kathy Frank is not hypotensive. -Consult GI for possible upper endoscopy -Change diet to nothing by mouth overnight for possible procedure -Recheck BMET and CBC tomorrow morning -Give normal saline 100 mL/hour 12 hours -Continue Phenergan 12.5 mg every 6 hours as needed for nausea -Continue Pepcid 20 mg twice daily -Check orthostatic vital signs  Chronic nausea/vomiting: Gastroparesis cannot be excluded given her poorly controlled diabetes. Kathy Frank has never undergone gastric imaging study to confirm this diagnosis and has been treated empirically. Though her vomiting and nausea have been attributed  to marijuana abuse in the past, Kathy Frank reported not having used it for the last several weeks; UDS notable for [facility] though may be appropriate as it is known to be present for even greater than 30 days depending on the amount ingested. Urine pregnancy test has also been negative. Hypoglycemic episodes can certainly cause the symptoms that Kathy Frank is having though Kathy Frank reports that her sugars consistently run in the 200s, and Kathy Frank is not on any short acting insulin which might precipitate these episodes. -Continue Reglan 5 mg 3 times daily  Poorly controlled diabetes: Glucose on admission 252. A1c 9.9 in March 2016. Historically, Kathy Frank has been noted to have type 2 diabetes though was found to have DKA on her most recent hospitalization. Given her age, unsure if Kathy Frank truly has type 2 diabetes. Kathy Frank is on 15 units Lantus at bedtime at home along with metformin 1000 mg twice daily. -Check CBG before meals and at bedtime -Start SSI-S -Give Lantus 10 units at  bedtime  Depression: Her medications include trazodone 100 mg at bedtime, Wellbutrin 150 mg. -Continue home medications  Hypertension: Continue Norvasc 5 mg  #FEN:  -Diet: Carb modified  #DVT prophylaxis: Lovenox  #CODE STATUS: FULL CODE   Dispo: Disposition is deferred at this time, awaiting improvement of current medical problems.   The patient does have a current PCP (Alani Sabbagh Sherrye Payor, MD) and doesneed an [facility] hospital follow-up appointment after discharge.  The patient does not know have transportation limitations that hinder transportation to clinic appointments.  Signed: Riccardo Dubin, MD 12/24/2014, 12:21 PM

## 2014-12-24 NOTE — Assessment & Plan Note (Signed)
The patient continues to have significant abdominal pain and has been increasing her intake of BC powders. The pt started having coffee ground emesis and is unable to keep down food or her medications. There is significdant concern for UGIB. Pt has has about a 2 unit Hgb drop since discharge making this more concerning. Will admit for urgent EGD.  Lab Results  Component Value Date   HGB 10.6* 12/24/2014

## 2014-12-25 DIAGNOSIS — K2210 Ulcer of esophagus without bleeding: Secondary | ICD-10-CM | POA: Diagnosis not present

## 2014-12-25 DIAGNOSIS — E1165 Type 2 diabetes mellitus with hyperglycemia: Secondary | ICD-10-CM | POA: Diagnosis not present

## 2014-12-25 DIAGNOSIS — K920 Hematemesis: Secondary | ICD-10-CM | POA: Diagnosis not present

## 2014-12-25 DIAGNOSIS — K3184 Gastroparesis: Secondary | ICD-10-CM | POA: Insufficient documentation

## 2014-12-25 DIAGNOSIS — E1143 Type 2 diabetes mellitus with diabetic autonomic (poly)neuropathy: Secondary | ICD-10-CM | POA: Diagnosis not present

## 2014-12-25 LAB — COMPREHENSIVE METABOLIC PANEL
ALT: 7 U/L — ABNORMAL LOW (ref 14–54)
AST: 12 U/L — ABNORMAL LOW (ref 15–41)
Albumin: 2.2 g/dL — ABNORMAL LOW (ref 3.5–5.0)
Alkaline Phosphatase: 53 U/L (ref 38–126)
Anion gap: 7 (ref 5–15)
BUN: 7 mg/dL (ref 6–20)
CO2: 24 mmol/L (ref 22–32)
Calcium: 8.6 mg/dL — ABNORMAL LOW (ref 8.9–10.3)
Chloride: 107 mmol/L (ref 101–111)
Creatinine, Ser: 1.09 mg/dL — ABNORMAL HIGH (ref 0.44–1.00)
GFR calc Af Amer: >60 mL/min (ref >60)
GFR calc non Af Amer: >60 mL/min (ref >60)
Glucose, Bld: 227 mg/dL — ABNORMAL HIGH (ref 65–99)
Potassium: 3.9 mmol/L (ref 3.5–5.1)
Sodium: 138 mmol/L (ref 135–145)
Total Bilirubin: 0.3 mg/dL (ref 0.3–1.2)
Total Protein: 5.5 g/dL — ABNORMAL LOW (ref 6.5–8.1)

## 2014-12-25 LAB — CBC
HCT: 29.6 % — ABNORMAL LOW (ref 36.0–46.0)
Hemoglobin: 9.8 g/dL — ABNORMAL LOW (ref 12.0–15.0)
MCH: 28.2 pg (ref 26.0–34.0)
MCHC: 33.1 g/dL (ref 30.0–36.0)
MCV: 85.3 fL (ref 78.0–100.0)
Platelets: 396 10*3/uL (ref 150–400)
RBC: 3.47 MIL/uL — ABNORMAL LOW (ref 3.87–5.11)
RDW: 13.3 % (ref 11.5–15.5)
WBC: 6.3 10*3/uL (ref 4.0–10.5)

## 2014-12-25 LAB — GLUCOSE, CAPILLARY
Glucose-Capillary: 216 mg/dL — ABNORMAL HIGH (ref 65–99)
Glucose-Capillary: 199 mg/dL — ABNORMAL HIGH (ref 65–99)

## 2014-12-25 MED ORDER — METOCLOPRAMIDE HCL 5 MG PO TABS: 5.0000 mg | ORAL_TABLET | Freq: Three times a day (TID) | ORAL | Status: DC

## 2014-12-25 MED ORDER — OMEPRAZOLE 20 MG PO CPDR: 20.0000 mg | DELAYED_RELEASE_CAPSULE | Freq: Every day | ORAL | Status: DC

## 2014-12-25 MED ORDER — LISINOPRIL 20 MG PO TABS: 20.0000 mg | ORAL_TABLET | Freq: Every day | ORAL | Status: DC

## 2014-12-25 NOTE — Progress Notes (Signed)
NCM spoke with patient , gave her prescription discount card  For [facility] for reglan and prilosec.

## 2014-12-25 NOTE — Discharge Instructions (Signed)
Thank you for trusting [place] with your medical care!  You were hospitalized for diabetic gastroparesis and treated with medication.   Please take note of the following changes to your medications: STOP BC powder as it can make your symptoms worse START Prilosec 20 mg daily and make sure to take at least 30 minutes before meal  Try eating smaller portions to prevent your body from feeling overloaded. You can also try chamomile tea or ginger ale if you're having crampy abdominal pain. Below is some more information regarding your condition.  To make sure you are getting better, please make it to the follow-up appointments listed on the first page.  If you have any questions, please call [phone].    Gastroparesis  Gastroparesis is also called slowed stomach emptying (delayed gastric emptying). It is a condition in which the stomach takes too long to empty its contents. It often happens in people with diabetes.  CAUSES  Gastroparesis happens when nerves to the stomach are damaged or stop working. When the nerves are damaged, the muscles of the stomach and intestines do not work normally. The movement of food is slowed or stopped. High blood glucose (sugar) causes changes in nerves and can damage the blood vessels that carry oxygen and nutrients to the nerves. RISK FACTORS  Diabetes.  Post-viral syndromes.  Eating disorders (anorexia, bulimia).  Surgery on the stomach or vagus nerve.  Gastroesophageal reflux disease (rarely).  Smooth muscle disorders (amyloidosis, scleroderma).  Metabolic disorders, including hypothyroidism.  Parkinson disease. SYMPTOMS   Heartburn.  Feeling sick to your stomach (nausea).  Vomiting of undigested food.  An early feeling of fullness when eating.  Weight loss.  Abdominal bloating.  Erratic blood glucose levels.  Lack of appetite.  Gastroesophageal reflux.  Spasms of the stomach wall. Complications can include:  Bacterial overgrowth  in stomach. Food stays in the stomach and can ferment and cause bacteria to grow.  Weight loss due to difficulty digesting and absorbing nutrients.  Vomiting.  Obstruction in the stomach. Undigested food can harden and cause nausea and vomiting.  Blood glucose fluctuations caused by inconsistent food absorption. DIAGNOSIS  The diagnosis of gastroparesis is confirmed through one or more of the following tests:  Barium X-rays and scans. These tests look at how long it takes for food to move through the stomach.  Gastric manometry. This test measures electrical and muscular activity in the stomach. A thin tube is passed down the throat into the stomach. The tube contains a wire that takes measurements of the stomach's electrical and muscular activity as it digests liquids and solid food.  Endoscopy. This procedure is done with a long, thin tube called an endoscope. It is passed through the mouth and gently down the esophagus into the stomach. This tube helps the caregiver look at the lining of the stomach to check for any abnormalities.  Ultrasonography. This can rule out gallbladder disease or pancreatitis. This test will outline and define the shape of the gallbladder and pancreas. TREATMENT   Treatments may include:  Exercise.  Medicines to control nausea and vomiting.  Medicines to stimulate stomach muscles.  Changes in what and when you eat.  Having smaller meals more often.  Eating low-fiber forms of high-fiber foods, such as eating cooked vegetables instead of raw vegetables.  Eating low-fat foods.  Consuming liquids, which are easier to digest.  In severe cases, feeding tubes and intravenous (IV) feeding may be needed. It is important to note that in most cases, treatment  does not cure gastroparesis. It is usually a lasting (chronic) condition. Treatment helps you manage the underlying condition so that you can be as healthy and comfortable as possible. Other  treatments  A gastric neurostimulator has been developed to assist people with gastroparesis. The battery-operated device is surgically implanted. It emits mild electrical pulses to help improve stomach emptying and to control nausea and vomiting.  The use of botulinum toxin has been shown to improve stomach emptying by decreasing the prolonged contractions of the muscle between the stomach and the small intestine (pyloric sphincter). The benefits are temporary. SEEK MEDICAL CARE IF:   You have diabetes and you are having problems keeping your blood glucose in goal range.  You are having nausea, vomiting, bloating, or early feelings of fullness with eating.  Your symptoms do not change with a change in diet. Document Released: 07/27/2005 Document Revised: 11/21/2012 Document Reviewed: 01/03/2009 [facility] Patient Information 2015 [place], [place]. This information is not intended to replace advice given to you by your health care provider. Make sure you discuss any questions you have with your health care provider.

## 2014-12-25 NOTE — Progress Notes (Signed)
Subjective: This morning, she feels better. We discussed the need for her to watch her blood sugars as it contributes to her gastric symptoms. We also discussed nonmedication management which includes eating smaller meals more frequently and to start taking Prilosec instead of Zantac for her erosive esophagitis. She is agreeable to continuing Reglan as well for empiric treatment of gastroparesis. We also deferred any opioid therapy and she acknowledged the adverse effects of these medications on her gastric function.  Objective: Vital signs in last 24 hours: Filed Vitals:   12/24/14 1227 12/24/14 2139 12/25/14 0516 12/25/14 1020  BP: 152/95 150/84 160/88 134/83  Pulse: 101 85 89   Temp: 98.9 F (37.2 C) 98.9 F (37.2 C) 98.5 F (36.9 C)   TempSrc: Oral Oral Oral   Resp: 18 18 18    Height: 5\' 5"  (1.651 m)     Weight: 162 lb 11.2 oz (73.8 kg)     SpO2: 97% 100% 97%    Weight change:   Intake/Output Summary (Last 24 hours) at 12/25/14 1100 Last data filed at 12/25/14 1047  Gross per 24 hour  Intake 1373.67 ml  Output   1000 ml  Net 373.67 ml   General: Overweight African-American female, resting in bed, NAD HEENT: PERRL, EOMI, no scleral icterus, no conjunctival pallor noted, oropharynx clear with moist mucous membranes Cardiac: Regular rate, no rubs, murmurs or gallops Pulm: clear to auscultation bilaterally in the posterior lung fields, no wheezes, rales, or rhonchi Abd: soft, nontender, nondistended, BS present, mild tenderness noted in right upper quadrant the stable from yesterday  Ext: warm and well perfused, no pedal or tibial edema, multiple tattoos noted on all extremities Neuro: Moving all extremities spontaneously, responds to questions appropriately  Lab Results: Basic Metabolic Panel:  Recent Labs Lab 12/24/14 0959 12/25/14 0415  NA 136 138  K 4.3 3.9  CL 103 107  CO2 26 24  GLUCOSE 252* 227*  BUN 7 7  CREATININE 1.08* 1.09*  CALCIUM 9.1 8.6*   Liver  Function Tests:  Recent Labs Lab 12/25/14 0415  AST 12*  ALT 7*  ALKPHOS 53  BILITOT 0.3  PROT 5.5*  ALBUMIN 2.2*   CBC:  Recent Labs Lab 12/24/14 0959 12/25/14 0415  WBC 5.9 6.3  HGB 10.6* 9.8*  HCT 32.1* 29.6*  MCV 85.8 85.3  PLT 479* 396   CBG:  Recent Labs Lab 12/24/14 1657 12/24/14 2137 12/25/14 0753  GLUCAP 335* 169* 216*   Urine Drug Screen: Drugs of Abuse     Component Value Date/Time   LABOPIA NONE DETECTED 12/14/2014 0946   COCAINSCRNUR NONE DETECTED 12/14/2014 0946   LABBENZ NONE DETECTED 12/14/2014 0946   AMPHETMU NONE DETECTED 12/14/2014 0946   THCU POSITIVE* 12/14/2014 0946   LABBARB NONE DETECTED 12/14/2014 0946    Studies/Results: No results found. Medications: I have reviewed the patient's current medications. Scheduled Meds: . amLODipine  5 mg Oral Daily  . buPROPion  150 mg Oral Daily  . enoxaparin (LOVENOX) injection  40 mg Subcutaneous Q24H  . insulin aspart  0-9 Units Subcutaneous TID WC  . insulin glargine  10 Units Subcutaneous QHS  . metoCLOPramide  5 mg Oral TID AC  . pantoprazole  40 mg Oral Daily  . traZODone  100 mg Oral QHS   Continuous Infusions:  PRN Meds:.docusate sodium, promethazine Assessment/Plan:  Ms. Trentadue is a 39 year old female with history of chronic nausea/vomiting likely secondary to diabetic gastroparesis, poorly controlled diabetes, hypertension, depression hospitalized for concern of  coffee-ground emesis 2/2 upper GI bleeding.   Coffee-ground emesis: Stable hemoglobin overnight and vital signs are reassuring for no acute upper GI bleeding. Per GI assessment yesterday, erosive esophagitis is likely given her chronic vomiting though minor Mallory-Weiss type tear cannot be excluded -Discontinue Pepcid  -Advised her against using BC powder or other NSAID treatments -Scheduled follow-up with GI as an outpatient should her symptoms persist beyond the 4 weeks of PPI therapy  Chronic nausea/vomiting: Likely  secondary to diabetic gastroparesis. She is to continue Reglan 5 mg 3 times daily and at bedtime on discharge. Per chart review, she was started on this medication in early April and should be reassessed to avoid prolonging duration of treatment past 12 weeks given the risks of extraparametal symptoms.  Poorly controlled diabetes: A1c 9.9, March 2016. She is to resume Lantus 15 units at bedtime at home along with metformin 1000 mg twice daily.  Depression: She is to continue trazodone 100 mg at bedtime, Wellbutrin 150 mg.  Hypertension: Blood pressure mostly normotensive since admission. She is to continue amlodipine 5 mg.  #FEN:  -Diet: Carb modified  #DVT prophylaxis: Lovenox  #CODE STATUS: FULL CODE  Dispo: Discharged today  The patient does have a current PCP (Riggs Dineen Sherrye Payor, MD) and does not know need an [facility] hospital follow-up appointment after discharge.  The patient does have transportation limitations that hinder transportation to clinic appointments.  .Services Needed at time of discharge: Y = Yes, Blank = No PT:   OT:   RN:   Equipment:   Other:     LOS: 1 day   Riccardo Dubin, MD 12/25/2014, 11:00 AM

## 2014-12-25 NOTE — Progress Notes (Signed)
Inpatient Diabetes Program Recommendations  AACE/ADA: New Consensus Statement on Inpatient Glycemic Control (2013)  Target Ranges:  Prepandial:   less than 140 mg/dL      Peak postprandial:   less than 180 mg/dL (1-2 hours)      Critically ill patients:  140 - 180 mg/dL    Results for MONTRESSA, PFIFFNER (MRN [id number]) as of 12/25/2014 09:53  Ref. Range 12/24/2014 16:57 12/24/2014 21:37  Glucose-Capillary Latest Ref Range: 65-99 mg/dL 335 (H) 169 (H)    Results for CLOIS, COLA (MRN [id number]) as of 12/25/2014 09:53  Ref. Range 12/25/2014 07:53  Glucose-Capillary Latest Ref Range: 65-99 mg/dL 216 (H)    Admit with: Hematemesis  History: DM, HTN, Cyclical Vomiting, Marijuana/ETOH  Home DM Meds: Lantus 15 units QHS       Metformin 1000 mg bid  Current DM Orders: Lantus 10 units QHS             Novolog Sensitive SSI tid with meals    **CBG elevated this AM- 216 mg/dl.  **Eating 75-100% of meals per documentation.    MD- Please consider the following in-hospital insulin adjustments:  1. Increase Novolog SSI to Moderate scale tid ac + HS  2. Increase Lantus to 15 units QHS (home dose)     Will follow Wyn Quaker RN, MSN, CDE Diabetes Coordinator Inpatient Diabetes Program Team Pager: [phone] (8a-5p)

## 2014-12-25 NOTE — Progress Notes (Signed)
Patient discharge teaching given, including activity, diet, follow-up appoints, and medications. Patient verbalized understanding of all discharge instructions. IV access was d/c'd. Vitals are stable. Skin is intact except as charted in most recent assessments. Pt to be escorted out by NT, to be driven home by family. 

## 2014-12-25 NOTE — Discharge Summary (Signed)
Name: Kathy Frank MRN: [id number] DOB: 10/31/1975 39 y.o. PCP: Riccardo Dubin, MD  Date of Admission: 12/24/2014 12:07 PM Date of Discharge: 12/25/2014 Attending Physician: Madilyn Fireman, MD  Discharge Diagnosis:  Coffee-ground emesis likely secondary to erosive esophagitis  Chronic nausea/vomiting likely secondary to diabetic gastroparesis  Poorly controlled diabetes  Depression  Hypertension  Discharge Medications:   Medication List    STOP taking these medications        BC HEADACHE POWDER PO     famotidine 20 MG tablet  Commonly known as:  PEPCID     metroNIDAZOLE 500 MG tablet  Commonly known as:  FLAGYL     oxyCODONE-acetaminophen 5-325 MG per tablet  Commonly known as:  PERCOCET/ROXICET      TAKE these medications        amLODipine 5 MG tablet  Commonly known as:  NORVASC  Take 1 tablet (5 mg total) by mouth daily.     buPROPion 150 MG 24 hr tablet  Commonly known as:  WELLBUTRIN XL  Take 1 tablet (150 mg total) by mouth daily.     docusate sodium 100 MG capsule  Commonly known as:  COLACE  Take 1 capsule (100 mg total) by mouth 2 (two) times daily as needed for mild constipation.     Insulin Glargine 100 UNIT/ML Solostar Pen  Commonly known as:  LANTUS  Inject 15 Units into the skin daily at 10 pm.     Insulin Pen Needle 32G X 4 MM Misc  1 Units by Does not apply route daily.     lisinopril 20 MG tablet  Commonly known as:  PRINIVIL,ZESTRIL  Take 20 mg by mouth daily.     metFORMIN 1000 MG tablet  Commonly known as:  GLUCOPHAGE  Take 1 tablet (1,000 mg total) by mouth 2 (two) times daily with a meal.     metoCLOPramide 5 MG tablet  Commonly known as:  REGLAN  Take 1 tablet (5 mg total) by mouth 4 (four) times daily -  before meals and at bedtime.     multivitamin with minerals Tabs tablet  Take 1 tablet by mouth daily.     omeprazole 20 MG capsule  Commonly known as:  PRILOSEC  Take 1 capsule (20 mg total) by mouth daily.     traZODone 100 MG tablet  Commonly known as:  DESYREL  Take 1 tablet (100 mg total) by mouth at bedtime.        Disposition and follow-up:   KathyYocelin C Wintrode was discharged from [facility] in Stable condition.  At the hospital follow up visit please address:  Erosive esophagitis: Adherence to PPI treatment, avoidance of NSAIDs  Diabetic gastroparesis: Frequency of symptoms, adherence to Reglan to determine duration of therapy [see below]  Anemia workup: consider anemia panel  Glycemic control  Cost of medications [see below]   Follow-up Appointments:     Follow-up Information    Follow up with Drucilla Schmidt, MD. Go on 01/02/2015.   Specialty:  Internal Medicine   Why:  @9 :45am   Contact information:   [place] [address] [phone]       Follow up with Scarlette Shorts, MD. Go on 02/26/2015.   Specialty:  Gastroenterology   Why:  @1 :45pm   Contact information:   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   This morning, she presented to clinic for her follow-up visit from most recent hospitalization for DKA and reported having an episode of coffee-ground emesis last week. Hemoglobin in clinic was found to be 10.6, decreased from 12.2 at the time of  discharge. Given the concern of an acute upper GI bleed as there was a concern for a possible Mallory-Weiss tear at her last hospitalization, she was admitted to the hospital for further workup.  At baseline, she reports vomiting every other day and cannot report any inciting trigger though does note that when she does not eat her first meal at a regular time that it often incites vomiting. She describes her vomitus is mostly whatever she has "in her stomach." It is also associated with sharp, crampy epigastric abdominal pain which is not associated with eating or any other activity. She denies any dysphagia, worsening appetite, weight loss, melena, hematochezia, dizziness. Her last menstrual period began after her most recent hospitalization typically runs for 7-8 days with 6 pads used on the heaviest day. Of note, she has been taking BC powder 1-2 packets per day since she ran out of her Percocet some weeks ago and reports adherence to Reglan 5 mg 3 times daily with Zofran 1-2 times daily as needed for nausea, vomiting. She otherwise denies chest pain, shortness of breath, recent sick contacts. For her diabetes, she takes Lantus 15 units at night and reports that her sugars typically run in the 200s throughout the day. She is currently unemployed and lives at home with her mom. She denies any recent tobacco or marijuana use and notes that she could not tolerate excessive alcohol as it often incites her vomiting; her most recent alcohol consumption was 3 days ago though within the amount she can tolerate.  Hospital Course by problem list:   Coffee-ground emesis: Likely secondary to erosive esophagitis though could exclude minor Mallory-Weiss tear from her chronic nausea/vomiting. GI was consulted on admission for possible EGD and recommended medical management with PPI therapy upon assessment. Stable hemoglobin, BUN, vital signs overnight were reassuring for no acute upper GI bleeding. On discharge, Pepcid  was discontinued and she was started on Prilosec 20 mg daily to complete a four-week course of therapy. He was also noted that though she is listed as having private insurance, she reported that it does not cover for her medications as she has been laid off from work for over a year. She is also ineligible for [facility] given his coverage plan. Social work was consulted post discharge to assist with cost of medications, and the recommendation should be acknowledged at the time of her follow-up visit. She was also advised to avoid NSAIDs, like Goody powder. She was also scheduled  outpatient follow-up with GI though not earlier than July per provider availability.  Chronic nausea/vomiting: Likely secondary to diabetic gastroparesis. She is to continue Reglan 5 mg 3 times daily and at bedtime on discharge. Per chart review, she was started on this medication in early March and her best adherence to treatment should be assessed through early June to avoid prolonging duration of treatment past the recommended 12 weeks given the risks of extraparametal symptoms. Though she requested Percocet and other opioid analgesics for crampy epigastric pain following her bouts of emesis, she was advised to avoid these medications as they could also worsen her clinical picture. She was recommended to try home remedies, like chamomile tea, and to eat frequent small portions to avoid overloading her gastric system..  Normocytic anemia: Hb throughout her hospital stay was stable at 10 and appeared to be at her baseline 10-12 when trended over the past year. She noted that she has heavy menstrual bleeding with onset following her prior discharge and was thought to be contributory in addition to her coffee-ground emesis. Anemia workup should be pursued at the time of outpatient.  Poorly controlled diabetes: A1c 9.9, March 2016. She is to resume Lantus 15 units at bedtime at home along with metformin 1000 mg twice daily. She is also  encouraged to monitor her blood sugar throughout the day as they can certainly contribute to her gastric symptoms to which she acknowledged understanding.  Depression: Stable on home medications.  Hypertension: Blood pressure mostly normotensive since admission. She is to continue amlodipine 5 mg.  Discharge Vitals:   BP 134/83 mmHg  Pulse 89  Temp(Src) 98.5 F (36.9 C) (Oral)  Resp 18  Ht 5\' 5"  (1.651 m)  Wt 162 lb 11.2 oz (73.8 kg)  BMI 27.07 kg/m2  SpO2 97%  LMP 12/14/2014 (Approximate)  Discharge Labs:  Results for orders placed or performed during the hospital encounter of 12/24/14 (from the past 24 hour(s))  Glucose, capillary     Status: Abnormal   Collection Time: 12/24/14  4:57 PM  Result Value Ref Range   Glucose-Capillary 335 (H) 65 - 99 mg/dL  Glucose, capillary     Status: Abnormal   Collection Time: 12/24/14  9:37 PM  Result Value Ref Range   Glucose-Capillary 169 (H) 65 - 99 mg/dL  Comprehensive metabolic panel     Status: Abnormal   Collection Time: 12/25/14  4:15 AM  Result Value Ref Range   Sodium 138 135 - 145 mmol/L   Potassium 3.9 3.5 - 5.1 mmol/L   Chloride 107 101 - 111 mmol/L   CO2 24 22 - 32 mmol/L   Glucose, Bld 227 (H) 65 - 99 mg/dL   BUN 7 6 - 20 mg/dL   Creatinine, Ser 1.09 (H) 0.44 - 1.00 mg/dL   Calcium 8.6 (L) 8.9 - 10.3 mg/dL   Total Protein 5.5 (L) 6.5 - 8.1 g/dL   Albumin 2.2 (L) 3.5 - 5.0 g/dL   AST 12 (L) 15 - 41 U/L   ALT 7 (L) 14 - 54 U/L   Alkaline Phosphatase 53 38 - 126 U/L   Total Bilirubin 0.3 0.3 - 1.2 mg/dL   GFR calc non Af Amer >60 >60 mL/min   GFR calc Af Amer >60 >60 mL/min   Anion gap 7 5 - 15  CBC     Status: Abnormal   Collection Time: 12/25/14  4:15 AM  Result Value Ref Range   WBC 6.3 4.0 - 10.5 K/uL  RBC 3.47 (L) 3.87 - 5.11 MIL/uL   Hemoglobin 9.8 (L) 12.0 - 15.0 g/dL   HCT 29.6 (L) 36.0 - 46.0 %   MCV 85.3 78.0 - 100.0 fL   MCH 28.2 26.0 - 34.0 pg   MCHC 33.1 30.0 - 36.0 g/dL   RDW 13.3 11.5 - 15.5 %     Platelets 396 150 - 400 K/uL  Glucose, capillary     Status: Abnormal   Collection Time: 12/25/14  7:53 AM  Result Value Ref Range   Glucose-Capillary 216 (H) 65 - 99 mg/dL    Signed: Riccardo Dubin, MD 12/27/2014, 11:30 AM    Services Ordered on Discharge: None Equipment Ordered on Discharge: None

## 2014-12-26 NOTE — Progress Notes (Signed)
Internal Medicine Clinic Attending  Case discussed with Dr. Sadek at the time of the visit.  We reviewed the resident's history and exam and pertinent patient test results.  I agree with the assessment, diagnosis, and plan of care documented in the resident's note.  

## 2015-01-01 ENCOUNTER — Telehealth: Payer: Self-pay | Admitting: Internal Medicine

## 2015-01-01 NOTE — Telephone Encounter (Signed)
Call to patient to confirm appointment for 01/02/15 at 9:45 mail box full

## 2015-01-02 ENCOUNTER — Ambulatory Visit: Payer: BLUE CROSS/BLUE SHIELD | Admitting: Internal Medicine

## 2015-01-08 ENCOUNTER — Ambulatory Visit: Payer: BLUE CROSS/BLUE SHIELD | Admitting: Internal Medicine

## 2015-02-26 ENCOUNTER — Ambulatory Visit: Payer: BLUE CROSS/BLUE SHIELD | Admitting: Internal Medicine

## 2015-05-30 ENCOUNTER — Emergency Department (HOSPITAL_COMMUNITY)
Admission: EM | Admit: 2015-05-30 | Discharge: 2015-05-30 | Disposition: A | Discharge status: Home / Self Care | Payer: Self-pay | Attending: Physician Assistant | Admitting: Physician Assistant

## 2015-05-30 ENCOUNTER — Encounter (HOSPITAL_COMMUNITY): Payer: Self-pay | Admitting: Emergency Medicine

## 2015-05-30 DIAGNOSIS — Z79899 Other long term (current) drug therapy: Secondary | ICD-10-CM | POA: Insufficient documentation

## 2015-05-30 DIAGNOSIS — K3184 Gastroparesis: Secondary | ICD-10-CM

## 2015-05-30 DIAGNOSIS — Z794 Long term (current) use of insulin: Secondary | ICD-10-CM | POA: Insufficient documentation

## 2015-05-30 DIAGNOSIS — Z862 Personal history of diseases of the blood and blood-forming organs and certain disorders involving the immune mechanism: Secondary | ICD-10-CM | POA: Insufficient documentation

## 2015-05-30 DIAGNOSIS — Z8669 Personal history of other diseases of the nervous system and sense organs: Secondary | ICD-10-CM | POA: Insufficient documentation

## 2015-05-30 DIAGNOSIS — K529 Noninfective gastroenteritis and colitis, unspecified: Secondary | ICD-10-CM | POA: Insufficient documentation

## 2015-05-30 DIAGNOSIS — Z87891 Personal history of nicotine dependence: Secondary | ICD-10-CM | POA: Insufficient documentation

## 2015-05-30 DIAGNOSIS — I10 Essential (primary) hypertension: Secondary | ICD-10-CM | POA: Insufficient documentation

## 2015-05-30 DIAGNOSIS — F329 Major depressive disorder, single episode, unspecified: Secondary | ICD-10-CM | POA: Insufficient documentation

## 2015-05-30 DIAGNOSIS — E1143 Type 2 diabetes mellitus with diabetic autonomic (poly)neuropathy: Secondary | ICD-10-CM | POA: Insufficient documentation

## 2015-05-30 LAB — COMPREHENSIVE METABOLIC PANEL
ALT: 11 U/L — ABNORMAL LOW (ref 14–54)
AST: 19 U/L (ref 15–41)
Albumin: 2.8 g/dL — ABNORMAL LOW (ref 3.5–5.0)
Alkaline Phosphatase: 67 U/L (ref 38–126)
Anion gap: 8 (ref 5–15)
BUN: 17 mg/dL (ref 6–20)
CO2: 27 mmol/L (ref 22–32)
Calcium: 8.6 mg/dL — ABNORMAL LOW (ref 8.9–10.3)
Chloride: 103 mmol/L (ref 101–111)
Creatinine, Ser: 1.83 mg/dL — ABNORMAL HIGH (ref 0.44–1.00)
GFR calc Af Amer: 39 mL/min — ABNORMAL LOW (ref >60)
GFR calc non Af Amer: 34 mL/min — ABNORMAL LOW (ref >60)
Glucose, Bld: 181 mg/dL — ABNORMAL HIGH (ref 65–99)
Potassium: 3.3 mmol/L — ABNORMAL LOW (ref 3.5–5.1)
Sodium: 138 mmol/L (ref 135–145)
Total Bilirubin: 0.4 mg/dL (ref 0.3–1.2)
Total Protein: 6.9 g/dL (ref 6.5–8.1)

## 2015-05-30 LAB — BLOOD GAS, VENOUS

## 2015-05-30 LAB — I-STAT VENOUS BLOOD GAS, ED
Acid-Base Excess: 4 mmol/L — ABNORMAL HIGH (ref 0.0–2.0)
Bicarbonate: 27.8 mEq/L — ABNORMAL HIGH (ref 20.0–24.0)
O2 Saturation: 60 %
TCO2: 29 mmol/L (ref 0–100)
pCO2, Ven: 37.3 mmHg — ABNORMAL LOW (ref 45.0–50.0)
pH, Ven: 7.48 — ABNORMAL HIGH (ref 7.250–7.300)
pO2, Ven: 29 mmHg — CL (ref 30.0–45.0)

## 2015-05-30 LAB — URINALYSIS, ROUTINE W REFLEX MICROSCOPIC
Bilirubin Urine: NEGATIVE
Glucose, UA: NEGATIVE mg/dL
Ketones, ur: 15 mg/dL — AB
Leukocytes, UA: NEGATIVE
Nitrite: NEGATIVE
Protein, ur: >300 mg/dL — AB
Specific Gravity, Urine: 1.017 (ref 1.005–1.030)
Urobilinogen, UA: 0.2 mg/dL (ref 0.0–1.0)
pH: 5.5 (ref 5.0–8.0)

## 2015-05-30 LAB — RAPID URINE DRUG SCREEN, HOSP PERFORMED
Amphetamines: NOT DETECTED
Barbiturates: NOT DETECTED
Benzodiazepines: NOT DETECTED
Cocaine: NOT DETECTED
Opiates: POSITIVE — AB
Tetrahydrocannabinol: POSITIVE — AB

## 2015-05-30 LAB — URINE MICROSCOPIC-ADD ON

## 2015-05-30 LAB — CBC
HCT: 32.4 % — ABNORMAL LOW (ref 36.0–46.0)
Hemoglobin: 10.8 g/dL — ABNORMAL LOW (ref 12.0–15.0)
MCH: 28.3 pg (ref 26.0–34.0)
MCHC: 33.3 g/dL (ref 30.0–36.0)
MCV: 85 fL (ref 78.0–100.0)
Platelets: 184 10*3/uL (ref 150–400)
RBC: 3.81 MIL/uL — ABNORMAL LOW (ref 3.87–5.11)
RDW: 13.9 % (ref 11.5–15.5)
WBC: 6.4 10*3/uL (ref 4.0–10.5)

## 2015-05-30 LAB — CBG MONITORING, ED: Glucose-Capillary: 176 mg/dL — ABNORMAL HIGH (ref 65–99)

## 2015-05-30 LAB — I-STAT BETA HCG BLOOD, ED (MC, WL, AP ONLY): I-stat hCG, quantitative: <5 m[IU]/mL (ref <5)

## 2015-05-30 LAB — LIPASE, BLOOD: Lipase: 26 U/L (ref 11–51)

## 2015-05-30 MED ORDER — MORPHINE SULFATE (PF) 4 MG/ML IV SOLN
4.0000 mg | Freq: Once | INTRAVENOUS | Status: AC
  Administered 2015-05-30: 4 mg via INTRAVENOUS
  Filled 2015-05-30: qty 1

## 2015-05-30 MED ORDER — ONDANSETRON HCL 4 MG/2ML IJ SOLN
4.0000 mg | Freq: Once | INTRAMUSCULAR | Status: AC
  Administered 2015-05-30: 4 mg via INTRAVENOUS
  Filled 2015-05-30: qty 2

## 2015-05-30 MED ORDER — ONDANSETRON 4 MG PO TBDP: ORAL_TABLET | ORAL | Status: DC

## 2015-05-30 MED ORDER — SODIUM CHLORIDE 0.9 % IV BOLUS (SEPSIS)
1000.0000 mL | Freq: Once | INTRAVENOUS | Status: AC
  Administered 2015-05-30: 1000 mL via INTRAVENOUS

## 2015-05-30 NOTE — ED Provider Notes (Signed)
CSN: [id number]     Arrival date & time 05/30/15  0825 History   First MD Initiated Contact with Patient 05/30/15 [phone]     Chief Complaint  Patient presents with  . Nausea  . Emesis  . Diarrhea     (Consider location/radiation/quality/duration/timing/severity/associated sxs/prior Treatment) Patient is a 39 y.o. female presenting with vomiting and diarrhea. The history is provided by the patient and medical records. No language interpreter was used.  Emesis Associated symptoms: abdominal pain and diarrhea   Associated symptoms: no headaches   Diarrhea Associated symptoms: abdominal pain and vomiting   Associated symptoms: no diaphoresis, no fever and no headaches      Kathy Frank is a 39 y.o. female  with a hx of insulin-dependent diabetes, hypertension, gastroparesis, cyclical vomiting, DKA, anemia presents to the Emergency Department complaining of gradual, persistent, progressively worsening nausea vomiting and diarrhea onset yesterday evening while at work. Patient reports 6 episodes of nonbloody and nonbilious emesis. She reports 4 episodes of watery diarrhea without melena or hematochezia. She reports that her daughter is sick with the same symptoms for the last several days. No treatments prior to arrival. Associated symptoms include generalized abdominal cramping rated at a 6/10. Nothing seems to make her symptoms better or worse. Patient denies fever, chills, headache, neck pain, chest pain, shortness of breath, weakness, dizziness, syncope, dysuria, vaginal discharge. Patient's last menstrual cycle was on 05/08/2015.   Past Medical History  Diagnosis Date  . Depression   . Type II diabetes mellitus ([place])     diagnosed around 44  . Hypertension   . Gastroparesis     ? diabetic  . Cyclical vomiting     THC induced???  . DKA (diabetic ketoacidoses) ([place]) 12/2014  . Normocytic anemia   . Erosive esophagitis    Past Surgical History  Procedure Laterality Date  .  Tubal ligation  2002   No family history on file. Social History  Substance Use Topics  . Smoking status: Former Smoker -- 0.10 packs/day for 10 years    Types: Cigarettes    Start date: 11/17/2013    Quit date: 09/09/2014  . Smokeless tobacco: Never Used  . Alcohol Use: Yes     Comment: 11/13/2013 "occasionally; once or twice/month"   OB History    No data available     Review of Systems  Constitutional: Negative for fever, diaphoresis, appetite change, fatigue and unexpected weight change.  HENT: Negative for mouth sores.   Eyes: Negative for visual disturbance.  Respiratory: Negative for cough, chest tightness, shortness of breath and wheezing.   Cardiovascular: Negative for chest pain.  Gastrointestinal: Positive for nausea, vomiting, abdominal pain and diarrhea. Negative for constipation.  Endocrine: Negative for polydipsia, polyphagia and polyuria.  Genitourinary: Negative for dysuria, urgency, frequency and hematuria.  Musculoskeletal: Negative for back pain and neck stiffness.  Skin: Negative for rash.  Allergic/Immunologic: Negative for immunocompromised state.  Neurological: Negative for syncope, light-headedness and headaches.  Hematological: Does not bruise/bleed easily.  Psychiatric/Behavioral: Negative for sleep disturbance. The patient is not nervous/anxious.       Allergies  Review of patient's allergies indicates no known allergies.  Home Medications   Prior to Admission medications   Medication Sig Start Date End Date Taking? Authorizing Provider  buPROPion (WELLBUTRIN XL) 150 MG 24 hr tablet Take 1 tablet (150 mg total) by mouth daily. 11/24/13  Yes Nino Glow McLean-Scocozza, MD  Insulin Glargine (LANTUS) 100 UNIT/ML Solostar Pen Inject 15 Units into the skin  daily at 10 pm. Patient taking differently: Inject 15 Units into the skin at bedtime.  11/14/14  Yes Lucious Groves, DO  Insulin Pen Needle 32G X 4 MM MISC 1 Units by Does not apply route daily. 11/14/14   Yes Lucious Groves, DO  lisinopril (PRINIVIL,ZESTRIL) 20 MG tablet Take 1 tablet (20 mg total) by mouth daily. 12/25/14  Yes Rushil Sherrye Payor, MD  metFORMIN (GLUCOPHAGE) 1000 MG tablet Take 1 tablet (1,000 mg total) by mouth 2 (two) times daily with a meal. 11/14/14 11/14/15 Yes Lucious Groves, DO  traZODone (DESYREL) 100 MG tablet Take 1 tablet (100 mg total) by mouth at bedtime. Patient taking differently: Take 100 mg by mouth at bedtime as needed for sleep.  11/24/13  Yes Nino Glow McLean-Scocozza, MD  amLODipine (NORVASC) 5 MG tablet Take 1 tablet (5 mg total) by mouth daily. 11/14/14 11/14/15  Lucious Groves, DO  docusate sodium (COLACE) 100 MG capsule Take 1 capsule (100 mg total) by mouth 2 (two) times daily as needed for mild constipation. Patient not taking: Reported on 05/30/2015 11/09/14   Kelby Aline, MD  metoCLOPramide (REGLAN) 5 MG tablet Take 1 tablet (5 mg total) by mouth 4 (four) times daily -  before meals and at bedtime. Patient not taking: Reported on 05/30/2015 12/25/14   Riccardo Dubin, MD  omeprazole (PRILOSEC) 20 MG capsule Take 1 capsule (20 mg total) by mouth daily. Patient not taking: Reported on 05/30/2015 12/25/14   Riccardo Dubin, MD  ondansetron (ZOFRAN ODT) 4 MG disintegrating tablet 4mg  ODT q4 hours prn nausea/vomit 05/30/15   Azarya Oconnell, PA-C   BP 155/87 mmHg  Pulse 87  Temp(Src) 98.4 F (36.9 C) (Oral)  Ht 5\' 5"  (1.651 m)  Wt 160 lb (72.576 kg)  BMI 26.63 kg/m2  SpO2 100%  LMP 05/08/2015 Physical Exam  Constitutional: She appears well-developed and well-nourished. No distress.  Awake, alert, nontoxic appearance  HENT:  Head: Normocephalic and atraumatic.  Mouth/Throat: Oropharynx is clear and moist. No oropharyngeal exudate.  Eyes: Conjunctivae are normal. No scleral icterus.  Neck: Normal range of motion. Neck supple.  Cardiovascular: Normal rate, regular rhythm, normal heart sounds and intact distal pulses.   No murmur heard. Pulmonary/Chest: Effort  normal and breath sounds normal. No respiratory distress. She has no wheezes.  Equal chest expansion  Abdominal: Soft. Bowel sounds are normal. She exhibits no distension and no mass. There is no tenderness. There is no rebound and no guarding.  Soft and nontender  Musculoskeletal: Normal range of motion. She exhibits no edema.  Neurological: She is alert.  Speech is clear and goal oriented Moves extremities without ataxia  Skin: Skin is warm and dry. She is not diaphoretic.  Psychiatric: She has a normal mood and affect.  Nursing note and vitals reviewed.   ED Course  Procedures (including critical care time) Labs Review Labs Reviewed  COMPREHENSIVE METABOLIC PANEL - Abnormal; Notable for the following:    Potassium 3.3 (*)    Glucose, Bld 181 (*)    Creatinine, Ser 1.83 (*)    Calcium 8.6 (*)    Albumin 2.8 (*)    ALT 11 (*)    GFR calc non Af Amer 34 (*)    GFR calc Af Amer 39 (*)    All other components within normal limits  CBC - Abnormal; Notable for the following:    RBC 3.81 (*)    Hemoglobin 10.8 (*)    HCT 32.4 (*)  All other components within normal limits  URINALYSIS, ROUTINE W REFLEX MICROSCOPIC (NOT AT [facility]) - Abnormal; Notable for the following:    Hgb urine dipstick SMALL (*)    Ketones, ur 15 (*)    Protein, ur >300 (*)    All other components within normal limits  URINE RAPID DRUG SCREEN, HOSP PERFORMED - Abnormal; Notable for the following:    Opiates POSITIVE (*)    Tetrahydrocannabinol POSITIVE (*)    All other components within normal limits  URINE MICROSCOPIC-ADD ON - Abnormal; Notable for the following:    Squamous Epithelial / LPF MANY (*)    Bacteria, UA FEW (*)    Casts HYALINE CASTS (*)    All other components within normal limits  CBG MONITORING, ED - Abnormal; Notable for the following:    Glucose-Capillary 176 (*)    All other components within normal limits  I-STAT VENOUS BLOOD GAS, ED - Abnormal; Notable for the following:    pH,  Ven 7.480 (*)    pCO2, Ven 37.3 (*)    pO2, Ven 29.0 (*)    Bicarbonate 27.8 (*)    Acid-Base Excess 4.0 (*)    All other components within normal limits  LIPASE, BLOOD  BLOOD GAS, VENOUS  I-STAT BETA HCG BLOOD, ED ([facility], [facility], [facility] ONLY)    Imaging Review No results found. I have personally reviewed and evaluated these images and lab results as part of my medical decision-making.   EKG Interpretation None      MDM   Final diagnoses:  Gastroenteritis  Diabetic gastroparesis ([place])   Kathy Frank presents with nausea, vomiting and diarrhea after exposure to the same. Concern for possible viral gastroenteritis versus gastroparesis versus DKA. Labs pending.   1:34 PM Patient reports she is feeling much better.  No further episodes of emesis or diarrhea. Abdominal pain is resolved. No evidence of DKA. Patient is afebrile without tachycardia or hypertension. Repeat abdominal exam shows soft and nontender abdomen.  Urinalysis pending. We'll plan for discharge home.    2:43 PM Patient abdomen remained soft and nontender. THC positive UDS. UA without evidence of urinary tract infection. Patient tolerating by mouth without further emesis or abdominal pain. Will be discharged home with Zofran. No evidence of appendicitis, diverticulitis, colitis, pregnancy, cholecystitis.  BP 155/87 mmHg  Pulse 87  Temp(Src) 98.4 F (36.9 C) (Oral)  Ht 5\' 5"  (1.651 m)  Wt 160 lb (72.576 kg)  BMI 26.63 kg/m2  SpO2 100%  LMP 05/08/2015   Abigail Butts, PA-C 05/30/15 1444  Courteney Lyn Mackuen, MD 05/30/15 1626

## 2015-05-30 NOTE — Discharge Instructions (Signed)
1. Medications: zofran, usual home medications 2. Treatment: rest, drink plenty of fluids, advance diet slowly 3. Follow Up: Please followup with your primary doctor in 2 days for discussion of your diagnoses and further evaluation after today's visit; if you do not have a primary care doctor use the resource guide provided to find one; Please return to the ER for persistent vomiting, high fevers or worsening symptoms    Viral Gastroenteritis Viral gastroenteritis is also known as stomach flu. This condition affects the stomach and intestinal tract. It can cause sudden diarrhea and vomiting. The illness typically lasts 3 to 8 days. Most people develop an immune response that eventually gets rid of the virus. While this natural response develops, the virus can make you quite ill. CAUSES  Many different viruses can cause gastroenteritis, such as rotavirus or noroviruses. You can catch one of these viruses by consuming contaminated food or water. You may also catch a virus by sharing utensils or other personal items with an infected person or by touching a contaminated surface. SYMPTOMS  The most common symptoms are diarrhea and vomiting. These problems can cause a severe loss of body fluids (dehydration) and a body salt (electrolyte) imbalance. Other symptoms may include:  Fever.  Headache.  Fatigue.  Abdominal pain. DIAGNOSIS  Your caregiver can usually diagnose viral gastroenteritis based on your symptoms and a physical exam. A stool sample may also be taken to test for the presence of viruses or other infections. TREATMENT  This illness typically goes away on its own. Treatments are aimed at rehydration. The most serious cases of viral gastroenteritis involve vomiting so severely that you are not able to keep fluids down. In these cases, fluids must be given through an intravenous line (IV). HOME CARE INSTRUCTIONS   Drink enough fluids to keep your urine clear or pale yellow. Drink small  amounts of fluids frequently and increase the amounts as tolerated.  Ask your caregiver for specific rehydration instructions.  Avoid:  Foods high in sugar.  Alcohol.  Carbonated drinks.  Tobacco.  Juice.  Caffeine drinks.  Extremely hot or cold fluids.  Fatty, greasy foods.  Too much intake of anything at one time.  Dairy products until 24 to 48 hours after diarrhea stops.  You may consume probiotics. Probiotics are active cultures of beneficial bacteria. They may lessen the amount and number of diarrheal stools in adults. Probiotics can be found in yogurt with active cultures and in supplements.  Wash your hands well to avoid spreading the virus.  Only take over-the-counter or prescription medicines for pain, discomfort, or fever as directed by your caregiver. Do not give aspirin to children. Antidiarrheal medicines are not recommended.  Ask your caregiver if you should continue to take your regular prescribed and over-the-counter medicines.  Keep all follow-up appointments as directed by your caregiver. SEEK IMMEDIATE MEDICAL CARE IF:   You are unable to keep fluids down.  You do not urinate at least once every 6 to 8 hours.  You develop shortness of breath.  You notice blood in your stool or vomit. This may look like coffee grounds.  You have abdominal pain that increases or is concentrated in one small area (localized).  You have persistent vomiting or diarrhea.  You have a fever.  The patient is a child younger than 3 months, and he or she has a fever.  The patient is a child older than 3 months, and he or she has a fever and persistent symptoms.  The  patient is a child older than 3 months, and he or she has a fever and symptoms suddenly get worse.  The patient is a baby, and he or she has no tears when crying. MAKE SURE YOU:   Understand these instructions.  Will watch your condition.  Will get help right away if you are not doing well or get  worse.   This information is not intended to replace advice given to you by your health care provider. Make sure you discuss any questions you have with your health care provider.   Document Released: 07/27/2005 Document Revised: 10/19/2011 Document Reviewed: 05/13/2011 Elsevier Interactive Patient Education [employer].

## 2015-05-30 NOTE — ED Notes (Signed)
IV attempted. Unsuccessful.

## 2015-05-30 NOTE — ED Notes (Signed)
Was at work last night, had sudden onset of diaphoresis, n/v/d.  6 episodes of emesis, 4 episodes of diarrhea since that time. Also c/o mid lower abdominal pain. VSS, afebrile.

## 2015-07-15 ENCOUNTER — Encounter (HOSPITAL_COMMUNITY): Payer: Self-pay | Admitting: Emergency Medicine

## 2015-07-15 ENCOUNTER — Emergency Department (INDEPENDENT_AMBULATORY_CARE_PROVIDER_SITE_OTHER)
Admission: EM | Admit: 2015-07-15 | Discharge: 2015-07-15 | Disposition: A | Discharge status: Home / Self Care | Payer: Self-pay | Source: Home / Self Care | Attending: Family Medicine | Admitting: Family Medicine

## 2015-07-15 DIAGNOSIS — Z8619 Personal history of other infectious and parasitic diseases: Secondary | ICD-10-CM

## 2015-07-15 DIAGNOSIS — W57XXXA Bitten or stung by nonvenomous insect and other nonvenomous arthropods, initial encounter: Secondary | ICD-10-CM

## 2015-07-15 DIAGNOSIS — B86 Scabies: Secondary | ICD-10-CM

## 2015-07-15 DIAGNOSIS — T148 Other injury of unspecified body region: Secondary | ICD-10-CM

## 2015-07-15 MED ORDER — PERMETHRIN 5 % EX CREA: TOPICAL_CREAM | CUTANEOUS | Status: DC

## 2015-07-15 MED ORDER — TRIAMCINOLONE ACETONIDE 0.1 % EX CREA: 1.0000 "application " | TOPICAL_CREAM | Freq: Two times a day (BID) | CUTANEOUS | Status: DC

## 2015-07-15 NOTE — Discharge Instructions (Signed)
Scabies, Adult Scabies is a skin condition that happens when very small insects get under the skin (infestation). This causes a rash and severe itchiness. Scabies can spread from person to person (is contagious). If you get scabies, it is common for others in your household to get scabies too. With proper treatment, symptoms usually go away in 2-4 weeks. Scabies usually does not cause lasting problems. CAUSES This condition is caused by mites (Sarcoptes scabiei, or human itch mites) that can only be seen with a microscope. The mites get into the top layer of skin and lay eggs. Scabies can spread from person to person through:  Close contact with a person who has scabies.  Contact with infested items, such as towels, bedding, or clothing. RISK FACTORS This condition is more likely to develop in:  People who live in nursing homes and other extended-care facilities.  People who have sexual contact with a partner who has scabies.  Young children who attend child care facilities.  People who care for others who are at increased risk for scabies. SYMPTOMS Symptoms of this condition may include:  Severe itchiness. This is often worse at night.  A rash that includes tiny red bumps or blisters. The rash commonly occurs on the wrist, elbow, armpit, fingers, waist, groin, or buttocks. Bumps may form a line (burrow) in some areas.  Skin irritation. This can include scaly patches or sores. DIAGNOSIS This condition is diagnosed with a physical exam. Your health care provider will look closely at your skin. In some cases, your health care provider may take a sample of your affected skin (skin scraping) and have it examined under a microscope. TREATMENT This condition may be treated with:  Medicated cream or lotion that kills the mites. This is spread on the entire body and left on for several hours. Usually, one treatment with medicated cream or lotion is enough to kill all of the mites. In severe  cases, the treatment may be repeated.  Medicated cream that relieves itching.  Medicines that help to relieve itching.  Medicines that kill the mites. This treatment is rarely used. HOME CARE INSTRUCTIONS Medicines  Take or apply over-the-counter and prescription medicines as told by your health care provider.  Apply medicated cream or lotion as told by your health care provider.  Do not wash off the medicated cream or lotion until the necessary amount of time has passed. Skin Care  Avoid scratching your affected skin.  Keep your fingernails closely trimmed to reduce injury from scratching.  Take cool baths or apply cool washcloths to help reduce itching. General Instructions  Clean all items that you recently had contact with, including bedding, clothing, and furniture. Do this on the same day that your treatment starts.  Use hot water when you wash items.  Place unwashable items into closed, airtight plastic bags for at least 3 days. The mites cannot live for more than 3 days away from human skin.  Vacuum furniture and mattresses that you use.  Make sure that other people who may have been infested are examined by a health care provider. These include members of your household and anyone who may have had contact with infested items.  Keep all follow-up visits as told by your health care provider. This is important. SEEK MEDICAL CARE IF:  You have itching that does not go away after 4 weeks of treatment.  You continue to develop new bumps or burrows.  You have redness, swelling, or pain in your rash area   after treatment.  You have fluid, blood, or pus coming from your rash.   This information is not intended to replace advice given to you by your health care provider. Make sure you discuss any questions you have with your health care provider.   Document Released: 04/17/2015 Document Reviewed: 02/26/2015 Elsevier Interactive Patient Education 2016 Elsevier Inc.  

## 2015-07-15 NOTE — ED Provider Notes (Signed)
CSN: [id number]     Arrival date & time 07/15/15  1302 History   First MD Initiated Contact with Patient 07/15/15 1344     Chief Complaint  Patient presents with  . Rash   (Consider location/radiation/quality/duration/timing/severity/associated sxs/prior Treatment) HPI Comments: 39 year old female complaining of bug bites primarily to the extremities, back and anterior torso. She states she had been staying in a motel and was advised that it had bedbugs. This was the same motel in which she had stayed several months ago, contracted scabies and came here for treatment. She denies illness, fever, chills or other systemic symptoms.   Past Medical History  Diagnosis Date  . Depression   . Type II diabetes mellitus ([place])     diagnosed around 42  . Hypertension   . Gastroparesis     ? diabetic  . Cyclical vomiting     THC induced???  . DKA (diabetic ketoacidoses) ([place]) 12/2014  . Normocytic anemia   . Erosive esophagitis    Past Surgical History  Procedure Laterality Date  . Tubal ligation  2002   No family history on file. Social History  Substance Use Topics  . Smoking status: Former Smoker -- 0.10 packs/day for 10 years    Types: Cigarettes    Start date: 11/17/2013    Quit date: 09/09/2014  . Smokeless tobacco: Never Used  . Alcohol Use: Yes     Comment: 11/13/2013 "occasionally; once or twice/month"   OB History    No data available     Review of Systems  Constitutional: Negative.   HENT: Negative.   Respiratory: Negative.   Skin: Positive for rash.  Neurological: Negative.     Allergies  Review of patient's allergies indicates no known allergies.  Home Medications   Prior to Admission medications   Medication Sig Start Date End Date Taking? Authorizing Provider  amLODipine (NORVASC) 5 MG tablet Take 1 tablet (5 mg total) by mouth daily. 11/14/14 11/14/15  Lucious Groves, DO  buPROPion (WELLBUTRIN XL) 150 MG 24 hr tablet Take 1 tablet (150 mg total) by mouth  daily. 11/24/13   Nino Glow McLean-Scocozza, MD  Insulin Glargine (LANTUS) 100 UNIT/ML Solostar Pen Inject 15 Units into the skin daily at 10 pm. Patient taking differently: Inject 15 Units into the skin at bedtime.  11/14/14   Lucious Groves, DO  Insulin Pen Needle 32G X 4 MM MISC 1 Units by Does not apply route daily. 11/14/14   Lucious Groves, DO  lisinopril (PRINIVIL,ZESTRIL) 20 MG tablet Take 1 tablet (20 mg total) by mouth daily. 12/25/14   Rushil Sherrye Payor, MD  ondansetron (ZOFRAN ODT) 4 MG disintegrating tablet 4mg  ODT q4 hours prn nausea/vomit 05/30/15   Hannah Muthersbaugh, PA-C  permethrin (ELIMITE) 5 % cream Apply from neck to feet. Rinse in 8 hours. Repeat in 1 week. 07/15/15   Janne Napoleon, NP  triamcinolone cream (KENALOG) 0.1 % Apply 1 application topically 2 (two) times daily. Apply to affected areas of itching bid prn 07/15/15   Janne Napoleon, NP   Meds Ordered and Administered this Visit  Medications - No data to display  BP 160/90 mmHg  Pulse 83  Temp(Src) 98 F (36.7 C) (Oral)  Resp 16  SpO2 100%  LMP 07/07/2015 No data found.   Physical Exam  Constitutional: She is oriented to person, place, and time. She appears well-developed and well-nourished. No distress.  Neck: Normal range of motion. Neck supple.  Cardiovascular: Normal rate.   Pulmonary/Chest:  Effort normal. No respiratory distress.  Musculoskeletal: Normal range of motion. She exhibits no edema.  Neurological: She is alert and oriented to person, place, and time. She exhibits normal muscle tone.  Skin: Skin is warm and dry.  There are several smooth rounded elevated lesions in various stages, some are mildly red others are scabbing. These are the primary areas of itching. She also has areas to her forearms and hands which she has scratched over whether or no apparent similar mounded lesions. No lesions in the webspaces.  Psychiatric: She has a normal mood and affect.  Nursing note and vitals reviewed.   ED Course   Procedures (including critical care time)  Labs Review Labs Reviewed - No data to display  Imaging Review No results found.   Visual Acuity Review  Right Eye Distance:   Left Eye Distance:   Bilateral Distance:    Right Eye Near:   Left Eye Near:    Bilateral Near:         MDM   1. Insect bites   2. Scabies   3. History of scabies    Trim\amcinolone for areas of itching from bedbugs. Some areas appear similar to scabies. Tx with Elimite.    Janne Napoleon, NP 07/15/15 1356

## 2015-07-15 NOTE — ED Notes (Signed)
Rash right ankle, noticed 2 weeks .

## 2015-07-29 ENCOUNTER — Encounter: Payer: Self-pay | Admitting: Internal Medicine

## 2015-09-27 ENCOUNTER — Emergency Department (HOSPITAL_COMMUNITY)
Admission: EM | Admit: 2015-09-27 | Discharge: 2015-09-27 | Disposition: A | Discharge status: Home / Self Care | Payer: Self-pay | Attending: Emergency Medicine | Admitting: Emergency Medicine

## 2015-09-27 ENCOUNTER — Encounter (HOSPITAL_COMMUNITY): Payer: Self-pay | Admitting: Family Medicine

## 2015-09-27 DIAGNOSIS — Z79899 Other long term (current) drug therapy: Secondary | ICD-10-CM | POA: Insufficient documentation

## 2015-09-27 DIAGNOSIS — F329 Major depressive disorder, single episode, unspecified: Secondary | ICD-10-CM | POA: Insufficient documentation

## 2015-09-27 DIAGNOSIS — Z87891 Personal history of nicotine dependence: Secondary | ICD-10-CM | POA: Insufficient documentation

## 2015-09-27 DIAGNOSIS — Z862 Personal history of diseases of the blood and blood-forming organs and certain disorders involving the immune mechanism: Secondary | ICD-10-CM | POA: Insufficient documentation

## 2015-09-27 DIAGNOSIS — Z794 Long term (current) use of insulin: Secondary | ICD-10-CM | POA: Insufficient documentation

## 2015-09-27 DIAGNOSIS — R1084 Generalized abdominal pain: Secondary | ICD-10-CM | POA: Insufficient documentation

## 2015-09-27 DIAGNOSIS — Z7984 Long term (current) use of oral hypoglycemic drugs: Secondary | ICD-10-CM | POA: Insufficient documentation

## 2015-09-27 DIAGNOSIS — E119 Type 2 diabetes mellitus without complications: Secondary | ICD-10-CM | POA: Insufficient documentation

## 2015-09-27 DIAGNOSIS — Z8719 Personal history of other diseases of the digestive system: Secondary | ICD-10-CM | POA: Insufficient documentation

## 2015-09-27 DIAGNOSIS — Z3202 Encounter for pregnancy test, result negative: Secondary | ICD-10-CM | POA: Insufficient documentation

## 2015-09-27 DIAGNOSIS — I10 Essential (primary) hypertension: Secondary | ICD-10-CM | POA: Insufficient documentation

## 2015-09-27 DIAGNOSIS — Z8669 Personal history of other diseases of the nervous system and sense organs: Secondary | ICD-10-CM | POA: Insufficient documentation

## 2015-09-27 LAB — COMPREHENSIVE METABOLIC PANEL
ALT: 13 U/L — ABNORMAL LOW (ref 14–54)
AST: 16 U/L (ref 15–41)
Albumin: 3.1 g/dL — ABNORMAL LOW (ref 3.5–5.0)
Alkaline Phosphatase: 82 U/L (ref 38–126)
Anion gap: 11 (ref 5–15)
BUN: 12 mg/dL (ref 6–20)
CO2: 20 mmol/L — ABNORMAL LOW (ref 22–32)
Calcium: 8.9 mg/dL (ref 8.9–10.3)
Chloride: 107 mmol/L (ref 101–111)
Creatinine, Ser: 1.27 mg/dL — ABNORMAL HIGH (ref 0.44–1.00)
GFR calc Af Amer: >60 mL/min (ref >60)
GFR calc non Af Amer: 53 mL/min — ABNORMAL LOW (ref >60)
Glucose, Bld: 336 mg/dL — ABNORMAL HIGH (ref 65–99)
Potassium: 3.4 mmol/L — ABNORMAL LOW (ref 3.5–5.1)
Sodium: 138 mmol/L (ref 135–145)
Total Bilirubin: 0.2 mg/dL — ABNORMAL LOW (ref 0.3–1.2)
Total Protein: 7.8 g/dL (ref 6.5–8.1)

## 2015-09-27 LAB — URINE MICROSCOPIC-ADD ON

## 2015-09-27 LAB — URINALYSIS, ROUTINE W REFLEX MICROSCOPIC
Bilirubin Urine: NEGATIVE
Glucose, UA: >1000 mg/dL — AB
Ketones, ur: 15 mg/dL — AB
Leukocytes, UA: NEGATIVE
Nitrite: NEGATIVE
Protein, ur: >300 mg/dL — AB
Specific Gravity, Urine: 1.018 (ref 1.005–1.030)
pH: 7 (ref 5.0–8.0)

## 2015-09-27 LAB — CBC
HCT: 32.6 % — ABNORMAL LOW (ref 36.0–46.0)
Hemoglobin: 10.9 g/dL — ABNORMAL LOW (ref 12.0–15.0)
MCH: 28.1 pg (ref 26.0–34.0)
MCHC: 33.4 g/dL (ref 30.0–36.0)
MCV: 84 fL (ref 78.0–100.0)
Platelets: 316 10*3/uL (ref 150–400)
RBC: 3.88 MIL/uL (ref 3.87–5.11)
RDW: 13.5 % (ref 11.5–15.5)
WBC: 5.4 10*3/uL (ref 4.0–10.5)

## 2015-09-27 LAB — I-STAT BETA HCG BLOOD, ED (MC, WL, AP ONLY): I-stat hCG, quantitative: <5 m[IU]/mL (ref <5)

## 2015-09-27 LAB — LIPASE, BLOOD: Lipase: 37 U/L (ref 11–51)

## 2015-09-27 MED ORDER — ONDANSETRON 4 MG PO TBDP
4.0000 mg | ORAL_TABLET | Freq: Once | ORAL | Status: AC | PRN
  Administered 2015-09-27: 4 mg via ORAL
  Filled 2015-09-27: qty 1

## 2015-09-27 MED ORDER — SODIUM CHLORIDE 0.9 % IV BOLUS (SEPSIS)
1000.0000 mL | Freq: Once | INTRAVENOUS | Status: AC
  Administered 2015-09-27: 1000 mL via INTRAVENOUS

## 2015-09-27 MED ORDER — MORPHINE SULFATE (PF) 4 MG/ML IV SOLN
4.0000 mg | Freq: Once | INTRAVENOUS | Status: AC
Start: 2015-09-27 — End: 2015-09-27
  Administered 2015-09-27: 4 mg via INTRAVENOUS
  Filled 2015-09-27: qty 1

## 2015-09-27 MED ORDER — MORPHINE SULFATE (PF) 4 MG/ML IV SOLN
4.0000 mg | Freq: Once | INTRAVENOUS | Status: AC
  Administered 2015-09-27: 4 mg via INTRAVENOUS
  Filled 2015-09-27: qty 1

## 2015-09-27 NOTE — Discharge Instructions (Signed)
Please follow-up with your doctor, call the office today for blood pressure recheck and medication management. Continue taking her medications as prescribed. Return to ED for any new or worsening symptoms as we discussed.  Abdominal Pain, Adult Many things can cause abdominal pain. Usually, abdominal pain is not caused by a disease and will improve without treatment. It can often be observed and treated at home. Your health care provider will do a physical exam and possibly order blood tests and X-rays to help determine the seriousness of your pain. However, in many cases, more time must pass before a clear cause of the pain can be found. Before that point, your health care provider may not know if you need more testing or further treatment. HOME CARE INSTRUCTIONS Monitor your abdominal pain for any changes. The following actions may help to alleviate any discomfort you are experiencing:  Only take over-the-counter or prescription medicines as directed by your health care provider.  Do not take laxatives unless directed to do so by your health care provider.  Try a clear liquid diet (broth, tea, or water) as directed by your health care provider. Slowly move to a bland diet as tolerated. SEEK MEDICAL CARE IF:  You have unexplained abdominal pain.  You have abdominal pain associated with nausea or diarrhea.  You have pain when you urinate or have a bowel movement.  You experience abdominal pain that wakes you in the night.  You have abdominal pain that is worsened or improved by eating food.  You have abdominal pain that is worsened with eating fatty foods.  You have a fever. SEEK IMMEDIATE MEDICAL CARE IF:  Your pain does not go away within 2 hours.  You keep throwing up (vomiting).  Your pain is felt only in portions of the abdomen, such as the right side or the left lower portion of the abdomen.  You pass bloody or black tarry stools. MAKE SURE YOU:  Understand these  instructions.  Will watch your condition.  Will get help right away if you are not doing well or get worse.   This information is not intended to replace advice given to you by your health care provider. Make sure you discuss any questions you have with your health care provider.   Document Released: 05/06/2005 Document Revised: 04/17/2015 Document Reviewed: 04/05/2013 Elsevier Interactive Patient Education [employer].

## 2015-09-27 NOTE — ED Notes (Addendum)
Pt here for upper abd  pain that woke her up this am. sts sharp pain with vomiting,.

## 2015-09-27 NOTE — ED Provider Notes (Signed)
CSN: [id number]     Arrival date & time 09/27/15  1024 History   First MD Initiated Contact with Patient 09/27/15 1301     Chief Complaint  Patient presents with  . Abdominal Pain     (Consider location/radiation/quality/duration/timing/severity/associated sxs/prior Treatment) HPI Kathy Frank is a 40 y.o. female with a history of insulin-dependent type 2 diabetes, gastroparesis, cyclical vomiting syndrome, DKA, anemia comes in for evaluation of abdominal pain. Patient reports this morning at approximately 4:00 AM she was woken by sudden onset, diffuse, central abdominal pain that she characterizes as sharp and burning. She has not taken anything to improve her symptoms. Nothing seems to make the problem better or worse. She reports she has taken all of her diabetes medications today. Denies any alcohol use. She does mention that she ate some ham last night that has been causing her some abdominal discomfort since that time. Denies any fevers, chills, nausea or vomiting, diarrhea or constipation, urinary symptoms, vaginal bleeding or discharge. Currently on menstrual cycle  Past Medical History  Diagnosis Date  . Depression   . Type II diabetes mellitus ([place])     diagnosed around 70  . Hypertension   . Gastroparesis     ? diabetic  . Cyclical vomiting     THC induced???  . DKA (diabetic ketoacidoses) ([place]) 12/2014  . Normocytic anemia   . Erosive esophagitis    Past Surgical History  Procedure Laterality Date  . Tubal ligation  2002   History reviewed. No pertinent family history. Social History  Substance Use Topics  . Smoking status: Former Smoker -- 0.10 packs/day for 10 years    Types: Cigarettes    Start date: 11/17/2013    Quit date: 09/09/2014  . Smokeless tobacco: Never Used  . Alcohol Use: Yes     Comment: 11/13/2013 "occasionally; once or twice/month"   OB History    No data available     Review of Systems A 10 point review of systems was completed and was  negative except for pertinent positives and negatives as mentioned in the history of present illness     Allergies  Review of patient's allergies indicates no known allergies.  Home Medications   Prior to Admission medications   Medication Sig Start Date End Date Taking? Authorizing Provider  amLODipine (NORVASC) 5 MG tablet Take 1 tablet (5 mg total) by mouth daily. 11/14/14 11/14/15 Yes Lucious Groves, DO  buPROPion (WELLBUTRIN XL) 150 MG 24 hr tablet Take 1 tablet (150 mg total) by mouth daily. 11/24/13  Yes Nino Glow McLean-Scocozza, MD  Insulin Glargine (LANTUS) 100 UNIT/ML Solostar Pen Inject 15 Units into the skin daily at 10 pm. Patient taking differently: Inject 15 Units into the skin at bedtime.  11/14/14  Yes Lucious Groves, DO  Insulin Pen Needle 32G X 4 MM MISC 1 Units by Does not apply route daily. 11/14/14  Yes Lucious Groves, DO  lisinopril (PRINIVIL,ZESTRIL) 20 MG tablet Take 1 tablet (20 mg total) by mouth daily. 12/25/14  Yes Rushil Sherrye Payor, MD  metFORMIN (GLUCOPHAGE) 500 MG tablet Take 500 mg by mouth 2 (two) times daily with a meal.   Yes Historical Provider, MD  ondansetron (ZOFRAN ODT) 4 MG disintegrating tablet 4mg  ODT q4 hours prn nausea/vomit Patient not taking: Reported on 09/27/2015 05/30/15   Jarrett Soho Muthersbaugh, PA-C  permethrin (ELIMITE) 5 % cream Apply from neck to feet. Rinse in 8 hours. Repeat in 1 week. Patient not taking: Reported on  09/27/2015 07/15/15   Janne Napoleon, NP  triamcinolone cream (KENALOG) 0.1 % Apply 1 application topically 2 (two) times daily. Apply to affected areas of itching bid prn Patient not taking: Reported on 09/27/2015 07/15/15   Janne Napoleon, NP   BP 207/98 mmHg  Pulse 85  Temp(Src) 97.4 F (36.3 C) (Oral)  Resp 26  SpO2 99%  LMP 08/27/2015 Physical Exam  Constitutional: She is oriented to person, place, and time. She appears well-developed and well-nourished.  HENT:  Head: Normocephalic and atraumatic.  Mouth/Throat: Oropharynx is clear and  moist.  Eyes: Conjunctivae are normal. Pupils are equal, round, and reactive to light. Right eye exhibits no discharge. Left eye exhibits no discharge. No scleral icterus.  Neck: Neck supple.  Cardiovascular: Normal rate, regular rhythm and normal heart sounds.   Pulmonary/Chest: Effort normal and breath sounds normal. No respiratory distress. She has no wheezes. She has no rales.  Abdominal: Soft.  Abdomen is soft and nondistended. Diffuse tenderness without any rebound or guarding. No other abnormalities noted  Musculoskeletal: She exhibits no tenderness.  Neurological: She is alert and oriented to person, place, and time.  Cranial Nerves II-XII grossly intact  Skin: Skin is warm and dry. No rash noted.  Psychiatric: She has a normal mood and affect.  Nursing note and vitals reviewed.   ED Course  Procedures (including critical care time) Labs Review Labs Reviewed  COMPREHENSIVE METABOLIC PANEL - Abnormal; Notable for the following:    Potassium 3.4 (*)    CO2 20 (*)    Glucose, Bld 336 (*)    Creatinine, Ser 1.27 (*)    Albumin 3.1 (*)    ALT 13 (*)    Total Bilirubin 0.2 (*)    GFR calc non Af Amer 53 (*)    All other components within normal limits  CBC - Abnormal; Notable for the following:    Hemoglobin 10.9 (*)    HCT 32.6 (*)    All other components within normal limits  URINALYSIS, ROUTINE W REFLEX MICROSCOPIC (NOT AT [facility]) - Abnormal; Notable for the following:    Color, Urine RED (*)    APPearance CLOUDY (*)    Glucose, UA >1000 (*)    Hgb urine dipstick LARGE (*)    Ketones, ur 15 (*)    Protein, ur >300 (*)    All other components within normal limits  URINE MICROSCOPIC-ADD ON - Abnormal; Notable for the following:    Squamous Epithelial / LPF 0-5 (*)    Bacteria, UA RARE (*)    All other components within normal limits  LIPASE, BLOOD  I-STAT BETA HCG BLOOD, ED ([facility], [facility], [facility] ONLY)    Imaging Review No results found. I have personally reviewed and  evaluated these images and lab results as part of my medical decision-making.   EKG Interpretation None     Meds given in ED:  Medications  ondansetron (ZOFRAN-ODT) disintegrating tablet 4 mg (4 mg Oral Given 09/27/15 1255)  sodium chloride 0.9 % bolus 1,000 mL (0 mLs Intravenous Stopped 09/27/15 1551)  morphine 4 MG/ML injection 4 mg (4 mg Intravenous Given 09/27/15 1332)  morphine 4 MG/ML injection 4 mg (4 mg Intravenous Given 09/27/15 1457)    New Prescriptions   No medications on file   Filed Vitals:   09/27/15 1039 09/27/15 1330 09/27/15 1400 09/27/15 1530  BP: 210/102 206/104 169/103 207/98  Pulse: 83 86 85   Temp: 97.4 F (36.3 C)     TempSrc: Oral  Resp: 26     SpO2: 100% 100% 99%     MDM  QADRIYYAH BERGSMA is a 40 y.o. female history of insulin-dependent type 2 diabetes comes in for evaluation of abdominal pain. On arrival she is hypertensive at 210/102, states she did take her medications today. Reports she is patient of outpatient family practice and will be able to call today for blood pressure recheck and medication reconciliation. At time of my evaluation blood pressure was 190/90. Denies any headache, chest pain or shortness of breath, changes in urinary habits. Screening labs are unremarkable. She does have an increase in glucose at 330, given 1 L normal saline. No gap other evidence of DKA.  Pregnancy test is negative. Lipase 37. Some hemoglobin in urinalysis, but this could be due to menses. She reports after fluids, pain medicine and nausea medicine, she feels much better and is ready to go home. Denies any other medical complaints at this time. Patient overall appears well, nontoxic, hemodynamically stable and appropriate for discharge. Discussed ED course, results and discharge instructions as well as follow-up with PCP in the next 2-3 days, patient verbalizes understanding and agrees with this plan. Final diagnoses:  Generalized abdominal pain         Comer Locket, PA-C 09/28/15 [id number]  Daleen Bo, MD 09/28/15 [phone]

## 2015-11-20 ENCOUNTER — Emergency Department (HOSPITAL_COMMUNITY)
Admission: EM | Admit: 2015-11-20 | Discharge: 2015-11-20 | Disposition: A | Discharge status: Home / Self Care | Payer: Medicaid Other | Attending: Emergency Medicine | Admitting: Emergency Medicine

## 2015-11-20 ENCOUNTER — Emergency Department (HOSPITAL_COMMUNITY): Payer: Medicaid Other

## 2015-11-20 ENCOUNTER — Encounter (HOSPITAL_COMMUNITY): Payer: Self-pay | Admitting: *Deleted

## 2015-11-20 DIAGNOSIS — R112 Nausea with vomiting, unspecified: Secondary | ICD-10-CM | POA: Diagnosis present

## 2015-11-20 DIAGNOSIS — I10 Essential (primary) hypertension: Secondary | ICD-10-CM | POA: Insufficient documentation

## 2015-11-20 DIAGNOSIS — F329 Major depressive disorder, single episode, unspecified: Secondary | ICD-10-CM | POA: Diagnosis not present

## 2015-11-20 DIAGNOSIS — E1143 Type 2 diabetes mellitus with diabetic autonomic (poly)neuropathy: Secondary | ICD-10-CM | POA: Insufficient documentation

## 2015-11-20 DIAGNOSIS — Z3202 Encounter for pregnancy test, result negative: Secondary | ICD-10-CM | POA: Insufficient documentation

## 2015-11-20 DIAGNOSIS — G43A0 Cyclical vomiting, not intractable: Secondary | ICD-10-CM | POA: Insufficient documentation

## 2015-11-20 DIAGNOSIS — R935 Abnormal findings on diagnostic imaging of other abdominal regions, including retroperitoneum: Secondary | ICD-10-CM

## 2015-11-20 DIAGNOSIS — Z87891 Personal history of nicotine dependence: Secondary | ICD-10-CM | POA: Diagnosis not present

## 2015-11-20 DIAGNOSIS — R000 Tachycardia, unspecified: Secondary | ICD-10-CM | POA: Insufficient documentation

## 2015-11-20 DIAGNOSIS — R109 Unspecified abdominal pain: Secondary | ICD-10-CM | POA: Diagnosis not present

## 2015-11-20 DIAGNOSIS — Z7984 Long term (current) use of oral hypoglycemic drugs: Secondary | ICD-10-CM | POA: Diagnosis not present

## 2015-11-20 DIAGNOSIS — Z794 Long term (current) use of insulin: Secondary | ICD-10-CM | POA: Insufficient documentation

## 2015-11-20 DIAGNOSIS — R1115 Cyclical vomiting syndrome unrelated to migraine: Secondary | ICD-10-CM

## 2015-11-20 DIAGNOSIS — Z79899 Other long term (current) drug therapy: Secondary | ICD-10-CM | POA: Insufficient documentation

## 2015-11-20 DIAGNOSIS — R1110 Vomiting, unspecified: Secondary | ICD-10-CM

## 2015-11-20 DIAGNOSIS — K3184 Gastroparesis: Secondary | ICD-10-CM

## 2015-11-20 LAB — URINALYSIS, ROUTINE W REFLEX MICROSCOPIC
Bilirubin Urine: NEGATIVE
Glucose, UA: 500 mg/dL — AB
Ketones, ur: 15 mg/dL — AB
Leukocytes, UA: NEGATIVE
Nitrite: NEGATIVE
Protein, ur: >300 mg/dL — AB
Specific Gravity, Urine: 1.017 (ref 1.005–1.030)
pH: 7 (ref 5.0–8.0)

## 2015-11-20 LAB — COMPREHENSIVE METABOLIC PANEL
ALT: 14 U/L (ref 14–54)
AST: 31 U/L (ref 15–41)
Albumin: 3 g/dL — ABNORMAL LOW (ref 3.5–5.0)
Alkaline Phosphatase: 79 U/L (ref 38–126)
Anion gap: 15 (ref 5–15)
BUN: 26 mg/dL — ABNORMAL HIGH (ref 6–20)
CO2: 26 mmol/L (ref 22–32)
Calcium: 8.9 mg/dL (ref 8.9–10.3)
Chloride: 89 mmol/L — ABNORMAL LOW (ref 101–111)
Creatinine, Ser: 2.05 mg/dL — ABNORMAL HIGH (ref 0.44–1.00)
GFR calc Af Amer: 34 mL/min — ABNORMAL LOW (ref >60)
GFR calc non Af Amer: 29 mL/min — ABNORMAL LOW (ref >60)
Glucose, Bld: 368 mg/dL — ABNORMAL HIGH (ref 65–99)
Potassium: 4.4 mmol/L (ref 3.5–5.1)
Sodium: 130 mmol/L — ABNORMAL LOW (ref 135–145)
Total Bilirubin: 1.6 mg/dL — ABNORMAL HIGH (ref 0.3–1.2)
Total Protein: 7.6 g/dL (ref 6.5–8.1)

## 2015-11-20 LAB — CBC
HCT: 35.6 % — ABNORMAL LOW (ref 36.0–46.0)
Hemoglobin: 12.4 g/dL (ref 12.0–15.0)
MCH: 28.8 pg (ref 26.0–34.0)
MCHC: 34.8 g/dL (ref 30.0–36.0)
MCV: 82.8 fL (ref 78.0–100.0)
Platelets: 251 10*3/uL (ref 150–400)
RBC: 4.3 MIL/uL (ref 3.87–5.11)
RDW: 13.5 % (ref 11.5–15.5)
WBC: 4.7 10*3/uL (ref 4.0–10.5)

## 2015-11-20 LAB — URINE MICROSCOPIC-ADD ON

## 2015-11-20 LAB — CBG MONITORING, ED: Glucose-Capillary: 224 mg/dL — ABNORMAL HIGH (ref 65–99)

## 2015-11-20 LAB — PREGNANCY, URINE: Preg Test, Ur: NEGATIVE

## 2015-11-20 LAB — LIPASE, BLOOD: Lipase: 33 U/L (ref 11–51)

## 2015-11-20 MED ORDER — METOCLOPRAMIDE HCL 5 MG/ML IJ SOLN
10.0000 mg | Freq: Once | INTRAMUSCULAR | Status: AC
  Administered 2015-11-20: 10 mg via INTRAVENOUS
  Filled 2015-11-20: qty 2

## 2015-11-20 MED ORDER — SODIUM CHLORIDE 0.9 % IV BOLUS (SEPSIS)
1000.0000 mL | Freq: Once | INTRAVENOUS | Status: AC
  Administered 2015-11-20: 1000 mL via INTRAVENOUS

## 2015-11-20 MED ORDER — ONDANSETRON HCL 4 MG/2ML IJ SOLN
4.0000 mg | Freq: Once | INTRAMUSCULAR | Status: AC
  Administered 2015-11-20: 4 mg via INTRAVENOUS
  Filled 2015-11-20: qty 2

## 2015-11-20 MED ORDER — DIATRIZOATE MEGLUMINE & SODIUM 66-10 % PO SOLN
ORAL | Status: AC
  Filled 2015-11-20: qty 30

## 2015-11-20 MED ORDER — MORPHINE SULFATE (PF) 4 MG/ML IV SOLN
4.0000 mg | Freq: Once | INTRAVENOUS | Status: AC
  Administered 2015-11-20: 4 mg via INTRAVENOUS
  Filled 2015-11-20: qty 1

## 2015-11-20 MED ORDER — METOCLOPRAMIDE HCL 10 MG PO TABS: 10.0000 mg | ORAL_TABLET | Freq: Four times a day (QID) | ORAL | Status: DC | PRN

## 2015-11-20 NOTE — ED Provider Notes (Signed)
CSN: [id number]     Arrival date & time 11/20/15  1028 History   First MD Initiated Contact with Patient 11/20/15 1047     Chief Complaint  Patient presents with  . Emesis   HPI The patient is a 40 year old black female PMH of HTN, DMT2, gastroparesis, who presents with acute onset of emesis x3 days and epigastric abdominal pain. She has not been able to tolerate foods due to stomach pain, denies lightheaded or dizziness, denies nausea, denies bowel changes no acute diarrhea noted. States her emesis looks like coffee grounds, denies hx of PUD or GERD. States the abdominal pain is mid-abdomen and describes as burning, no sharp or stabbing pain with no pain radiation.   Past Medical History  Diagnosis Date  . Depression   . Type II diabetes mellitus ([place])     diagnosed around 80  . Hypertension   . Gastroparesis     ? diabetic  . Cyclical vomiting     THC induced???  . DKA (diabetic ketoacidoses) ([place]) 12/2014  . Normocytic anemia   . Erosive esophagitis    Past Surgical History  Procedure Laterality Date  . Tubal ligation  2002   No family history on file. Social History  Substance Use Topics  . Smoking status: Former Smoker -- 0.10 packs/day for 10 years    Types: Cigarettes    Start date: 11/17/2013    Quit date: 09/09/2014  . Smokeless tobacco: Never Used  . Alcohol Use: Yes     Comment: 11/13/2013 "occasionally; once or twice/month"   OB History    No data available     Review of Systems All pertinent ROS as above in HPI  Allergies  Review of patient's allergies indicates no known allergies.  Home Medications   Prior to Admission medications   Medication Sig Start Date End Date Taking? Authorizing Provider  buPROPion (WELLBUTRIN XL) 150 MG 24 hr tablet Take 1 tablet (150 mg total) by mouth daily. 11/24/13   Nino Glow McLean-Scocozza, MD  Insulin Glargine (LANTUS) 100 UNIT/ML Solostar Pen Inject 15 Units into the skin daily at 10 pm. Patient taking differently:  Inject 15 Units into the skin at bedtime.  11/14/14   Lucious Groves, DO  Insulin Pen Needle 32G X 4 MM MISC 1 Units by Does not apply route daily. 11/14/14   Lucious Groves, DO  lisinopril (PRINIVIL,ZESTRIL) 20 MG tablet Take 1 tablet (20 mg total) by mouth daily. 12/25/14   Rushil Sherrye Payor, MD  metFORMIN (GLUCOPHAGE) 500 MG tablet Take 500 mg by mouth 2 (two) times daily with a meal.    Historical Provider, MD  ondansetron (ZOFRAN ODT) 4 MG disintegrating tablet 4mg  ODT q4 hours prn nausea/vomit Patient not taking: Reported on 09/27/2015 05/30/15   Jarrett Soho Muthersbaugh, PA-C  permethrin (ELIMITE) 5 % cream Apply from neck to feet. Rinse in 8 hours. Repeat in 1 week. Patient not taking: Reported on 09/27/2015 07/15/15   Janne Napoleon, NP  triamcinolone cream (KENALOG) 0.1 % Apply 1 application topically 2 (two) times daily. Apply to affected areas of itching bid prn Patient not taking: Reported on 09/27/2015 07/15/15   Janne Napoleon, NP   BP 150/97 mmHg  Pulse 98  Temp(Src) 98.4 F (36.9 C) (Oral)  Resp 18  Ht 5\' 5"  (1.651 m)  Wt 77.111 kg  BMI 28.29 kg/m2  SpO2 100% Physical Exam  Constitutional: She is oriented to person, place, and time. She appears well-developed and well-nourished.  HENT:  Head: Normocephalic and atraumatic.  Eyes: EOM are normal. Pupils are equal, round, and reactive to light.  Neck: Normal range of motion. Neck supple. No tracheal deviation present.  Cardiovascular: Regular rhythm, normal heart sounds and intact distal pulses.   Mildly tachycardic  Pulmonary/Chest: Effort normal and breath sounds normal. No respiratory distress. She has no wheezes. She has no rales.  Abdominal: Soft. Bowel sounds are normal. She exhibits no distension. There is tenderness. There is no rebound and no guarding.  Significant tenderness to palpation over all quadrants, guarding and moving during exam  Neurological: She is alert and oriented to person, place, and time. She exhibits normal muscle  tone. Coordination normal.  Skin: Skin is warm and dry. No rash noted. No erythema.  Nursing note and vitals reviewed.   ED Course  Procedures (including critical care time) Labs Review Labs Reviewed  LIPASE, BLOOD  COMPREHENSIVE METABOLIC PANEL  CBC  URINALYSIS, ROUTINE W REFLEX MICROSCOPIC (NOT AT [facility])    Imaging Review No results found. I have personally reviewed and evaluated these images and lab results as part of my medical decision-making.  MDM   The patient is a 40 year old with hx of gastroparesis, acute onset of pain, description of coffee ground emesis likely PUS or gastritis vs esophagitis. Checked CBC for hgb level, and CMP to assess dehydration and electrolyte status. CT and lipase ordered to r/o pancreatitis or appendicitis due to diffuse tenderness. Labs ordered and IV fluids started for hydration and pain medication given to patient.    If the patient is able to tolerate oral fluids.  She will be discharged home, told to follow up with her primary care doctor.  Discharged home.  Patient agrees the plan and all questions were answered  Dalia Heading, PA-C 11/26/15 [name], MD 11/26/15 1610

## 2015-11-20 NOTE — ED Notes (Signed)
Pt presents via POV c/o emesis since Monday.  Denies fever/diarrhea.  Hx: DM, reports CBG this AM 160.  Reports unable to eat or drink since Monday.  A x 4, NAD.

## 2015-11-20 NOTE — ED Notes (Signed)
PT CBG 224

## 2015-11-20 NOTE — ED Provider Notes (Signed)
I received pt in signout from [employer]. We were awaiting CT results as well as PO challenge. CT showed sludge vs stones in gallbladder w/ recommendation of [place]. US showed no abnormalities. Labwork is reassuring compared to previous, notable only for AKI. After receiving IVF and antiemetics, pt was able to tolerate oral contrast and later sprite w/ no vomiting. She endorsed mild nausea on repeat exam but as she has had no further vomiting I feel she is stable for discharge. Provided w/ short course of reglan to use as needed and reviewed return precautions including intractable vomiting or hyperglycemia. Her repeat BG here has shown some improvement. Pt voiced understanding and was discharged in satisfactory condition.  Sharlett Iles, MD 11/20/15 2123

## 2015-11-20 NOTE — ED Notes (Signed)
Patient transported to Ultrasound 

## 2015-11-20 NOTE — ED Notes (Signed)
PA at bedside.

## 2015-12-18 ENCOUNTER — Encounter: Payer: Self-pay | Admitting: Internal Medicine

## 2016-01-04 ENCOUNTER — Encounter (HOSPITAL_COMMUNITY): Payer: Self-pay | Admitting: Emergency Medicine

## 2016-01-04 DIAGNOSIS — R1013 Epigastric pain: Secondary | ICD-10-CM | POA: Diagnosis present

## 2016-01-04 DIAGNOSIS — E1143 Type 2 diabetes mellitus with diabetic autonomic (poly)neuropathy: Secondary | ICD-10-CM | POA: Diagnosis not present

## 2016-01-04 DIAGNOSIS — Z7984 Long term (current) use of oral hypoglycemic drugs: Secondary | ICD-10-CM | POA: Diagnosis not present

## 2016-01-04 DIAGNOSIS — I10 Essential (primary) hypertension: Secondary | ICD-10-CM | POA: Insufficient documentation

## 2016-01-04 DIAGNOSIS — Z79899 Other long term (current) drug therapy: Secondary | ICD-10-CM | POA: Diagnosis not present

## 2016-01-04 DIAGNOSIS — Z87891 Personal history of nicotine dependence: Secondary | ICD-10-CM | POA: Insufficient documentation

## 2016-01-04 DIAGNOSIS — Z794 Long term (current) use of insulin: Secondary | ICD-10-CM | POA: Diagnosis not present

## 2016-01-04 LAB — URINE MICROSCOPIC-ADD ON: WBC, UA: NONE SEEN WBC/hpf (ref 0–5)

## 2016-01-04 LAB — URINALYSIS, ROUTINE W REFLEX MICROSCOPIC
Glucose, UA: 250 mg/dL — AB
Ketones, ur: 15 mg/dL — AB
Leukocytes, UA: NEGATIVE
Nitrite: NEGATIVE
Protein, ur: >300 mg/dL — AB
Specific Gravity, Urine: 1.025 (ref 1.005–1.030)
pH: 6.5 (ref 5.0–8.0)

## 2016-01-04 LAB — CBC
HCT: 28.2 % — ABNORMAL LOW (ref 36.0–46.0)
Hemoglobin: 9.5 g/dL — ABNORMAL LOW (ref 12.0–15.0)
MCH: 27.5 pg (ref 26.0–34.0)
MCHC: 33.7 g/dL (ref 30.0–36.0)
MCV: 81.7 fL (ref 78.0–100.0)
Platelets: 361 10*3/uL (ref 150–400)
RBC: 3.45 MIL/uL — ABNORMAL LOW (ref 3.87–5.11)
RDW: 13.2 % (ref 11.5–15.5)
WBC: 5.8 10*3/uL (ref 4.0–10.5)

## 2016-01-04 LAB — COMPREHENSIVE METABOLIC PANEL
ALT: 10 U/L — ABNORMAL LOW (ref 14–54)
AST: 14 U/L — ABNORMAL LOW (ref 15–41)
Albumin: 2.9 g/dL — ABNORMAL LOW (ref 3.5–5.0)
Alkaline Phosphatase: 97 U/L (ref 38–126)
Anion gap: 11 (ref 5–15)
BUN: 19 mg/dL (ref 6–20)
CO2: 25 mmol/L (ref 22–32)
Calcium: 9.2 mg/dL (ref 8.9–10.3)
Chloride: 100 mmol/L — ABNORMAL LOW (ref 101–111)
Creatinine, Ser: 2.26 mg/dL — ABNORMAL HIGH (ref 0.44–1.00)
GFR calc Af Amer: 30 mL/min — ABNORMAL LOW (ref >60)
GFR calc non Af Amer: 26 mL/min — ABNORMAL LOW (ref >60)
Glucose, Bld: 270 mg/dL — ABNORMAL HIGH (ref 65–99)
Potassium: 3.3 mmol/L — ABNORMAL LOW (ref 3.5–5.1)
Sodium: 136 mmol/L (ref 135–145)
Total Bilirubin: 0.7 mg/dL (ref 0.3–1.2)
Total Protein: 7.9 g/dL (ref 6.5–8.1)

## 2016-01-04 LAB — POC URINE PREG, ED: Preg Test, Ur: NEGATIVE

## 2016-01-04 LAB — LIPASE, BLOOD: Lipase: 21 U/L (ref 11–51)

## 2016-01-04 MED ORDER — ONDANSETRON 4 MG PO TBDP
4.0000 mg | ORAL_TABLET | Freq: Once | ORAL | Status: AC | PRN
  Administered 2016-01-04: 4 mg via ORAL

## 2016-01-04 MED ORDER — ONDANSETRON 4 MG PO TBDP
ORAL_TABLET | ORAL | Status: DC
Start: 2016-01-04 — End: 2016-01-05
  Filled 2016-01-04: qty 1

## 2016-01-04 NOTE — ED Notes (Signed)
The patient said she began to have abdominal pain, nausea and vomiting since yesterday.  The patient said she has gotten worse instead of better.  She is a type II diabetic and does not know if it is her diabetes or not.  She rates her pain 8/10.

## 2016-01-05 ENCOUNTER — Emergency Department (HOSPITAL_COMMUNITY)
Admission: EM | Admit: 2016-01-05 | Discharge: 2016-01-05 | Disposition: A | Discharge status: Home / Self Care | Payer: Medicaid Other | Attending: Emergency Medicine | Admitting: Emergency Medicine

## 2016-01-05 DIAGNOSIS — K3184 Gastroparesis: Secondary | ICD-10-CM

## 2016-01-05 MED ORDER — METOCLOPRAMIDE HCL 5 MG/ML IJ SOLN
10.0000 mg | Freq: Once | INTRAMUSCULAR | Status: AC
  Administered 2016-01-05: 10 mg via INTRAVENOUS
  Filled 2016-01-05: qty 2

## 2016-01-05 MED ORDER — METOCLOPRAMIDE HCL 10 MG PO TABS: 10.0000 mg | ORAL_TABLET | Freq: Three times a day (TID) | ORAL | Status: DC | PRN

## 2016-01-05 MED ORDER — DIPHENHYDRAMINE HCL 50 MG/ML IJ SOLN
25.0000 mg | Freq: Once | INTRAMUSCULAR | Status: AC
  Administered 2016-01-05: 25 mg via INTRAVENOUS
  Filled 2016-01-05: qty 1

## 2016-01-05 MED ORDER — SODIUM CHLORIDE 0.9 % IV BOLUS (SEPSIS)
1000.0000 mL | Freq: Once | INTRAVENOUS | Status: AC
  Administered 2016-01-05: 1000 mL via INTRAVENOUS

## 2016-01-05 NOTE — ED Notes (Signed)
MD at bedside. 

## 2016-01-05 NOTE — ED Provider Notes (Signed)
CSN: [id number]     Arrival date & time 01/04/16  2009 History  By signing my name below, I, Kathy Frank, attest that this documentation has been prepared under the direction and in the presence of Orpah Greek, MD. Electronically Signed: [name], ED Scribe. 01/05/2016. 2:25 AM.    Chief Complaint  Patient presents with  . Abdominal Pain    The patient said she began to have abdominal pain, nausea and vomiting since yesterday.  The patient said she has gotten worse instead of better.  She is a type II diabetic and does not know if it is her diabetes or not.     The history is provided by the patient. No language interpreter was used.    HPI Comments: Kathy Frank is a 40 y.o. female with a history of NIDDM and subsequent gastroparesis, DKA, and erosive esophagitis, who presents to the Emergency Department complaining of constant, worsening, 8/10 mid and epigastric abdominal pain that began yesterday. She also complains of associated nausea and vomiting. No treatments or modifying factors were noted. Patient reports a history of gastroparesis but states she is unsure whether her pain is related to her gastroparesis. She states she does not see a nephrologist. She denies diarrhea.   Past Medical History  Diagnosis Date  . Depression   . Type II diabetes mellitus ([place])     diagnosed around 66  . Hypertension   . Gastroparesis     ? diabetic  . Cyclical vomiting     THC induced???  . DKA (diabetic ketoacidoses) ([place]) 12/2014  . Normocytic anemia   . Erosive esophagitis    Past Surgical History  Procedure Laterality Date  . Tubal ligation  2002   History reviewed. No pertinent family history. Social History  Substance Use Topics  . Smoking status: Former Smoker -- 0.10 packs/day for 10 years    Types: Cigarettes    Start date: 11/17/2013    Quit date: 09/09/2014  . Smokeless tobacco: Never Used  . Alcohol Use: Yes     Comment: 11/13/2013 "occasionally; once or  twice/month"   OB History    No data available     Review of Systems  Gastrointestinal: Positive for nausea, vomiting and abdominal pain. Negative for diarrhea.  All other systems reviewed and are negative.     Allergies  Review of patient's allergies indicates no known allergies.  Home Medications   Prior to Admission medications   Medication Sig Start Date End Date Taking? Authorizing Provider  amLODipine (NORVASC) 5 MG tablet Take 5 mg by mouth daily.   Yes Historical Provider, MD  buPROPion (WELLBUTRIN XL) 150 MG 24 hr tablet Take 1 tablet (150 mg total) by mouth daily. 11/24/13  Yes Nino Glow McLean-Scocozza, MD  Insulin Glargine (LANTUS) 100 UNIT/ML Solostar Pen Inject 15 Units into the skin daily at 10 pm. Patient taking differently: Inject 15 Units into the skin at bedtime.  11/14/14  Yes Lucious Groves, DO  Insulin Pen Needle 32G X 4 MM MISC 1 Units by Does not apply route daily. 11/14/14  Yes Lucious Groves, DO  lisinopril (PRINIVIL,ZESTRIL) 20 MG tablet Take 1 tablet (20 mg total) by mouth daily. 12/25/14  Yes Rushil Sherrye Payor, MD  metFORMIN (GLUCOPHAGE) 500 MG tablet Take 500 mg by mouth 2 (two) times daily with a meal.   Yes Historical Provider, MD  metoCLOPramide (REGLAN) 10 MG tablet Take 1 tablet (10 mg total) by mouth every 8 (eight) hours  as needed for nausea or vomiting. 01/05/16   Orpah Greek, MD   BP 134/89 mmHg  Pulse 96  Temp(Src) 98.2 F (36.8 C) (Oral)  Resp 16  Ht 5\' 5"  (1.651 m)  SpO2 99%  LMP 12/17/2015 Physical Exam  Constitutional: She is oriented to person, place, and time. She appears well-developed and well-nourished. No distress.  HENT:  Head: Normocephalic and atraumatic.  Right Ear: Hearing normal.  Left Ear: Hearing normal.  Nose: Nose normal.  Mouth/Throat: Oropharynx is clear and moist and mucous membranes are normal.  Eyes: Conjunctivae and EOM are normal. Pupils are equal, round, and reactive to light.  Neck: Normal range of  motion. Neck supple.  Cardiovascular: Regular rhythm, S1 normal and S2 normal.  Exam reveals no gallop and no friction rub.   No murmur heard. Pulmonary/Chest: Effort normal and breath sounds normal. No respiratory distress. She exhibits no tenderness.  Abdominal: Soft. Normal appearance and bowel sounds are normal. There is no hepatosplenomegaly. There is tenderness. There is no rebound, no guarding, no tenderness at McBurney's point and negative Murphy's sign. No hernia.  Epigastric tenderness; no guarding or rebound.   Musculoskeletal: Normal range of motion.  Neurological: She is alert and oriented to person, place, and time. She has normal strength. No cranial nerve deficit or sensory deficit. Coordination normal. GCS eye subscore is 4. GCS verbal subscore is 5. GCS motor subscore is 6.  Skin: Skin is warm, dry and intact. No rash noted. No cyanosis.  Psychiatric: She has a normal mood and affect. Her speech is normal and behavior is normal. Thought content normal.  Nursing note and vitals reviewed.   ED Course  Procedures (including critical care time)  DIAGNOSTIC STUDIES: Oxygen Saturation is 100% on RA, normal by my interpretation.    COORDINATION OF CARE: 1:40 AM - Lab results discussed with pt. Discussed treatment plan with pt at bedside which includes IV fluids and medications. Pt verbalized understanding and agreed to plan.   Labs Review Labs Reviewed  COMPREHENSIVE METABOLIC PANEL - Abnormal; Notable for the following:    Potassium 3.3 (*)    Chloride 100 (*)    Glucose, Bld 270 (*)    Creatinine, Ser 2.26 (*)    Albumin 2.9 (*)    AST 14 (*)    ALT 10 (*)    GFR calc non Af Amer 26 (*)    GFR calc Af Amer 30 (*)    All other components within normal limits  CBC - Abnormal; Notable for the following:    RBC 3.45 (*)    Hemoglobin 9.5 (*)    HCT 28.2 (*)    All other components within normal limits  URINALYSIS, ROUTINE W REFLEX MICROSCOPIC (NOT AT [facility]) - Abnormal;  Notable for the following:    APPearance CLOUDY (*)    Glucose, UA 250 (*)    Hgb urine dipstick MODERATE (*)    Bilirubin Urine SMALL (*)    Ketones, ur 15 (*)    Protein, ur >300 (*)    All other components within normal limits  URINE MICROSCOPIC-ADD ON - Abnormal; Notable for the following:    Squamous Epithelial / LPF 0-5 (*)    Bacteria, UA FEW (*)    Casts HYALINE CASTS (*)    All other components within normal limits  LIPASE, BLOOD  POC URINE PREG, ED   I have personally reviewed and evaluated these lab results as part of my medical decision-making.  MDM  Final diagnoses:  Gastroparesis    Patient presents to the emergency para for evaluation of nausea, vomiting, abdominal pain. Patient does have a history of gastroparesis with similar symptoms. Patient was tachycardic at arrival to the ER and appeared to be in some distress. She was administered Reglan and Benadryl in addition to IV fluids with improvement. Her blood work is unremarkable, no signs of any acute abnormality. Abdominal exam was benign. No concern for acute surgical process. Patient is now tolerating oral intake. Her vital signs have improved, specifically tachycardia resolved. Patient is appropriate for continued outpatient management of gastroparesis.  I personally performed the services described in this documentation, which was scribed in my presence. The recorded information has been reviewed and is accurate.     Orpah Greek, MD 01/06/16 [phone]

## 2016-01-05 NOTE — ED Notes (Signed)
Patient verbalized understanding of discharge instructions and denies any further needs or questions at this time. VS stable. Patient ambulatory with steady gait, assisted to ED entrance in wheelchair.  

## 2016-01-05 NOTE — Discharge Instructions (Signed)
Gastroparesis °Gastroparesis, also called delayed gastric emptying, is a condition in which food takes longer than normal to empty from the stomach. The condition is usually long-lasting (chronic). °CAUSES °This condition may be caused by: °· An endocrine disorder, such as hypothyroidism or diabetes. Diabetes is the most common cause of this condition. °· A nervous system disease, such as Parkinson disease or multiple sclerosis. °· Cancer, infection, or surgery of the stomach or vagus nerve. °· A connective tissue disorder, such as scleroderma. °· Certain medicines. °In most cases, the cause is not known. °RISK FACTORS °This condition is more likely to develop in: °· People with certain disorders, including endocrine disorders, eating disorders, amyloidosis, and scleroderma. °· People with certain diseases, including Parkinson disease or multiple sclerosis. °· People with cancer or infection of the stomach or vagus nerve. °· People who have had surgery on the stomach or vagus nerve. °· People who take certain medicines. °· Women. °SYMPTOMS °Symptoms of this condition include: °· An early feeling of fullness when eating. °· Nausea. °· Weight loss. °· Vomiting. °· Heartburn. °· Abdominal bloating. °· Inconsistent blood glucose levels. °· Lack of appetite. °· Acid from the stomach coming up into the esophagus (gastroesophageal reflux). °· Spasms of the stomach. °Symptoms may come and go. °DIAGNOSIS °This condition is diagnosed with tests, such as: °· Tests that check how long it takes food to move through the stomach and intestines. These tests include: °¨ Upper gastrointestinal (GI) series. In this test, X-rays of the intestines are taken after you drink a liquid. The liquid makes the intestines show up better on the X-rays. °¨ Gastric emptying scintigraphy. In this test, scans are taken after you eat food that contains a small amount of radioactive material. °¨ Wireless capsule GI monitoring system. This test  involves swallowing a capsule that records information about movement through the stomach. °· Gastric manometry. This test measures electrical and muscular activity in the stomach. It is done with a thin tube that is passed down the throat and into the stomach. °· Endoscopy. This test checks for abnormalities in the lining of the stomach. It is done with a long, thin tube that is passed down the throat and into the stomach. °· An ultrasound. This test can help rule out gallbladder disease or pancreatitis as a cause of your symptoms. It uses sound waves to take pictures of the inside of your body. °TREATMENT °There is no cure for gastroparesis. This condition may be managed with: °· Treatment of the underlying condition causing the gastroparesis. °· Lifestyle changes, including exercise and dietary changes. Dietary changes can include: °¨ Changes in what and when you eat. °¨ Eating smaller meals more often. °¨ Eating low-fat foods. °¨ Eating low-fiber forms of high-fiber foods, such as cooked vegetables instead of raw vegetables. °¨ Having liquid foods in place of solid foods. Liquid foods are easier to digest. °· Medicines. These may be given to control nausea and vomiting and to stimulate stomach muscles. °· Getting food through a feeding tube. This may be done in severe cases. °· A gastric neurostimulator. This is a device that is inserted into the body with surgery. It helps improve stomach emptying and control nausea and vomiting. °HOME CARE INSTRUCTIONS °· Follow your health care provider's instructions about exercise and diet. °· Take medicines only as directed by your health care provider. °SEEK MEDICAL CARE IF: °· Your symptoms do not improve with treatment. °· You have new symptoms. °SEEK IMMEDIATE MEDICAL CARE IF: °· You have   severe abdominal pain that does not improve with treatment. °· You have nausea that does not go away. °· You cannot keep fluids down. °  °This information is not intended to replace  advice given to you by your health care provider. Make sure you discuss any questions you have with your health care provider. °  °Document Released: 07/27/2005 Document Revised: 12/11/2014 Document Reviewed: 07/23/2014 °Elsevier Interactive Patient Education ©2016 [employer] ° °

## 2016-01-05 NOTE — ED Notes (Signed)
Patient provided with ice water per MD - pt tolerating well.

## 2016-01-06 ENCOUNTER — Encounter (HOSPITAL_COMMUNITY): Payer: Self-pay

## 2016-01-06 ENCOUNTER — Emergency Department (HOSPITAL_COMMUNITY)
Admission: EM | Admit: 2016-01-06 | Discharge: 2016-01-06 | Disposition: A | Discharge status: Home / Self Care | Payer: Medicaid Other | Attending: Emergency Medicine | Admitting: Emergency Medicine

## 2016-01-06 DIAGNOSIS — I10 Essential (primary) hypertension: Secondary | ICD-10-CM | POA: Diagnosis not present

## 2016-01-06 DIAGNOSIS — E119 Type 2 diabetes mellitus without complications: Secondary | ICD-10-CM | POA: Insufficient documentation

## 2016-01-06 DIAGNOSIS — Z9851 Tubal ligation status: Secondary | ICD-10-CM | POA: Diagnosis not present

## 2016-01-06 DIAGNOSIS — R000 Tachycardia, unspecified: Secondary | ICD-10-CM | POA: Insufficient documentation

## 2016-01-06 DIAGNOSIS — F329 Major depressive disorder, single episode, unspecified: Secondary | ICD-10-CM | POA: Diagnosis not present

## 2016-01-06 DIAGNOSIS — K3184 Gastroparesis: Secondary | ICD-10-CM | POA: Diagnosis not present

## 2016-01-06 DIAGNOSIS — Z862 Personal history of diseases of the blood and blood-forming organs and certain disorders involving the immune mechanism: Secondary | ICD-10-CM | POA: Insufficient documentation

## 2016-01-06 DIAGNOSIS — Z87891 Personal history of nicotine dependence: Secondary | ICD-10-CM | POA: Insufficient documentation

## 2016-01-06 DIAGNOSIS — K2970 Gastritis, unspecified, without bleeding: Secondary | ICD-10-CM

## 2016-01-06 DIAGNOSIS — Z7984 Long term (current) use of oral hypoglycemic drugs: Secondary | ICD-10-CM | POA: Diagnosis not present

## 2016-01-06 DIAGNOSIS — Z794 Long term (current) use of insulin: Secondary | ICD-10-CM | POA: Diagnosis not present

## 2016-01-06 DIAGNOSIS — R109 Unspecified abdominal pain: Secondary | ICD-10-CM | POA: Diagnosis present

## 2016-01-06 DIAGNOSIS — Z79899 Other long term (current) drug therapy: Secondary | ICD-10-CM | POA: Diagnosis not present

## 2016-01-06 DIAGNOSIS — Z3202 Encounter for pregnancy test, result negative: Secondary | ICD-10-CM | POA: Diagnosis not present

## 2016-01-06 LAB — URINE MICROSCOPIC-ADD ON: Bacteria, UA: NONE SEEN

## 2016-01-06 LAB — I-STAT TROPONIN, ED: Troponin i, poc: 0.01 ng/mL (ref 0.00–0.08)

## 2016-01-06 LAB — URINALYSIS, ROUTINE W REFLEX MICROSCOPIC
Bilirubin Urine: NEGATIVE
Glucose, UA: 100 mg/dL — AB
Ketones, ur: 15 mg/dL — AB
Leukocytes, UA: NEGATIVE
Nitrite: NEGATIVE
Protein, ur: >300 mg/dL — AB
Specific Gravity, Urine: 1.012 (ref 1.005–1.030)
pH: 7 (ref 5.0–8.0)

## 2016-01-06 LAB — CBC WITH DIFFERENTIAL/PLATELET
Basophils Absolute: 0 10*3/uL (ref 0.0–0.1)
Basophils Relative: 0 %
Eosinophils Absolute: 0 10*3/uL (ref 0.0–0.7)
Eosinophils Relative: 0 %
HCT: 30.3 % — ABNORMAL LOW (ref 36.0–46.0)
Hemoglobin: 10.1 g/dL — ABNORMAL LOW (ref 12.0–15.0)
Lymphocytes Relative: 21 %
Lymphs Abs: 1.2 10*3/uL (ref 0.7–4.0)
MCH: 27.7 pg (ref 26.0–34.0)
MCHC: 33.3 g/dL (ref 30.0–36.0)
MCV: 83.2 fL (ref 78.0–100.0)
Monocytes Absolute: 0.4 10*3/uL (ref 0.1–1.0)
Monocytes Relative: 7 %
Neutro Abs: 4.1 10*3/uL (ref 1.7–7.7)
Neutrophils Relative %: 72 %
Platelets: 301 10*3/uL (ref 150–400)
RBC: 3.64 MIL/uL — ABNORMAL LOW (ref 3.87–5.11)
RDW: 12.9 % (ref 11.5–15.5)
WBC: 5.7 10*3/uL (ref 4.0–10.5)

## 2016-01-06 LAB — COMPREHENSIVE METABOLIC PANEL
ALT: 11 U/L — ABNORMAL LOW (ref 14–54)
AST: 15 U/L (ref 15–41)
Albumin: 2.8 g/dL — ABNORMAL LOW (ref 3.5–5.0)
Alkaline Phosphatase: 84 U/L (ref 38–126)
Anion gap: 12 (ref 5–15)
BUN: 16 mg/dL (ref 6–20)
CO2: 25 mmol/L (ref 22–32)
Calcium: 8.8 mg/dL — ABNORMAL LOW (ref 8.9–10.3)
Chloride: 95 mmol/L — ABNORMAL LOW (ref 101–111)
Creatinine, Ser: 1.7 mg/dL — ABNORMAL HIGH (ref 0.44–1.00)
GFR calc Af Amer: 43 mL/min — ABNORMAL LOW (ref >60)
GFR calc non Af Amer: 37 mL/min — ABNORMAL LOW (ref >60)
Glucose, Bld: 266 mg/dL — ABNORMAL HIGH (ref 65–99)
Potassium: 3 mmol/L — ABNORMAL LOW (ref 3.5–5.1)
Sodium: 132 mmol/L — ABNORMAL LOW (ref 135–145)
Total Bilirubin: 0.5 mg/dL (ref 0.3–1.2)
Total Protein: 7.3 g/dL (ref 6.5–8.1)

## 2016-01-06 LAB — CBG MONITORING, ED: Glucose-Capillary: 249 mg/dL — ABNORMAL HIGH (ref 65–99)

## 2016-01-06 LAB — I-STAT BETA HCG BLOOD, ED (MC, WL, AP ONLY): I-stat hCG, quantitative: <5 m[IU]/mL (ref <5)

## 2016-01-06 LAB — LIPASE, BLOOD: Lipase: 24 U/L (ref 11–51)

## 2016-01-06 MED ORDER — SODIUM CHLORIDE 0.9 % IV SOLN
250.0000 mg | Freq: Once | INTRAVENOUS | Status: AC
  Administered 2016-01-06: 250 mg via INTRAVENOUS
  Filled 2016-01-06: qty 5

## 2016-01-06 MED ORDER — GI COCKTAIL ~~LOC~~
30.0000 mL | Freq: Once | ORAL | Status: AC
  Administered 2016-01-06: 30 mL via ORAL
  Filled 2016-01-06: qty 30

## 2016-01-06 MED ORDER — SODIUM CHLORIDE 0.9 % IV BOLUS (SEPSIS)
500.0000 mL | Freq: Once | INTRAVENOUS | Status: AC
  Administered 2016-01-06: 500 mL via INTRAVENOUS

## 2016-01-06 MED ORDER — METOCLOPRAMIDE HCL 5 MG/ML IJ SOLN
10.0000 mg | Freq: Once | INTRAMUSCULAR | Status: AC
  Administered 2016-01-06: 10 mg via INTRAVENOUS
  Filled 2016-01-06: qty 2

## 2016-01-06 MED ORDER — POTASSIUM CHLORIDE 20 MEQ PO PACK
40.0000 meq | PACK | Freq: Two times a day (BID) | ORAL | Status: DC
  Administered 2016-01-06: 40 meq via ORAL
  Filled 2016-01-06: qty 2

## 2016-01-06 MED ORDER — SUCRALFATE 1 G PO TABS
1.0000 g | ORAL_TABLET | Freq: Once | ORAL | Status: AC
  Administered 2016-01-06: 1 g via ORAL
  Filled 2016-01-06: qty 1

## 2016-01-06 MED ORDER — PANTOPRAZOLE SODIUM 40 MG IV SOLR
40.0000 mg | Freq: Once | INTRAVENOUS | Status: AC
  Administered 2016-01-06: 40 mg via INTRAVENOUS
  Filled 2016-01-06: qty 40

## 2016-01-06 MED ORDER — SODIUM CHLORIDE 0.9 % IV BOLUS (SEPSIS)
1000.0000 mL | Freq: Once | INTRAVENOUS | Status: AC
  Administered 2016-01-06: 1000 mL via INTRAVENOUS

## 2016-01-06 MED ORDER — DIPHENHYDRAMINE HCL 50 MG/ML IJ SOLN
12.5000 mg | Freq: Once | INTRAMUSCULAR | Status: AC
  Administered 2016-01-06: 12.5 mg via INTRAVENOUS
  Filled 2016-01-06: qty 1

## 2016-01-06 MED ORDER — OMEPRAZOLE 20 MG PO CPDR: 20.0000 mg | DELAYED_RELEASE_CAPSULE | Freq: Every day | ORAL | Status: DC

## 2016-01-06 MED ORDER — SUCRALFATE 1 G PO TABS: 1.0000 g | ORAL_TABLET | Freq: Two times a day (BID) | ORAL | Status: DC

## 2016-01-06 NOTE — ED Notes (Signed)
Pt c/o abdominal pain and vomiting onset Friday. Pt seen here Saturday for same, reports symptoms are getting worse.

## 2016-01-06 NOTE — ED Provider Notes (Signed)
CSN: [id number]     Arrival date & time 01/06/16  0745 History   First MD Initiated Contact with Patient 01/06/16 [phone]     Chief Complaint  Patient presents with  . Abdominal Pain  . Emesis     (Consider location/radiation/quality/duration/timing/severity/associated sxs/prior Treatment) HPI Comments: 40 year old female with history of diabetes mellitus, gastroparesis, erosive esophagitis presents for abdominal pain. The patient was seen about 36 hours ago for this. She reports that she's never had an episode of this pain that was so bad that she needed to come back to the hospital after initial treatment. She states that she used to be on outpatient medications for this issue but lost her insurance and is just getting a pack. She states that she is having sharp and burning pain in her epigastric area that seems to be radiating up through her chest. Denies shortness of breath. She reports she has been vomiting and not been able to tolerate by mouth. Denies diarrhea. Says this does feel similar but worse to other episodes when she's been told that she had issues with her gastroparesis. No fevers or chills.  Patient is a 40 y.o. female presenting with abdominal pain and vomiting.  Abdominal Pain Associated symptoms: nausea and vomiting   Associated symptoms: no chills, no constipation, no cough, no diarrhea, no dysuria, no fatigue, no fever and no shortness of breath   Emesis Associated symptoms: abdominal pain   Associated symptoms: no chills, no diarrhea, no headaches and no myalgias     Past Medical History  Diagnosis Date  . Depression   . Type II diabetes mellitus ([place])     diagnosed around 82  . Hypertension   . Gastroparesis     ? diabetic  . Cyclical vomiting     THC induced???  . DKA (diabetic ketoacidoses) ([place]) 12/2014  . Normocytic anemia   . Erosive esophagitis    Past Surgical History  Procedure Laterality Date  . Tubal ligation  2002   History reviewed. No  pertinent family history. Social History  Substance Use Topics  . Smoking status: Former Smoker -- 0.10 packs/day for 10 years    Types: Cigarettes    Start date: 11/17/2013    Quit date: 09/09/2014  . Smokeless tobacco: Never Used  . Alcohol Use: Yes     Comment: 11/13/2013 "occasionally; once or twice/month"   OB History    No data available     Review of Systems  Constitutional: Negative for fever, chills and fatigue.  HENT: Negative for congestion, postnasal drip and rhinorrhea.   Eyes: Negative for visual disturbance.  Respiratory: Negative for cough, chest tightness and shortness of breath.   Gastrointestinal: Positive for nausea, vomiting and abdominal pain. Negative for diarrhea and constipation.  Genitourinary: Negative for dysuria, urgency and frequency.  Musculoskeletal: Negative for myalgias and back pain.  Skin: Negative for rash.  Neurological: Negative for dizziness, weakness, numbness and headaches.  Hematological: Does not bruise/bleed easily.      Allergies  Review of patient's allergies indicates no known allergies.  Home Medications   Prior to Admission medications   Medication Sig Start Date End Date Taking? Authorizing Provider  amLODipine (NORVASC) 5 MG tablet Take 5 mg by mouth daily.   Yes Historical Provider, MD  Insulin Glargine (LANTUS) 100 UNIT/ML Solostar Pen Inject 15 Units into the skin daily at 10 pm. Patient taking differently: Inject 15 Units into the skin at bedtime.  11/14/14  Yes Lucious Groves, DO  lisinopril (  PRINIVIL,ZESTRIL) 20 MG tablet Take 1 tablet (20 mg total) by mouth daily. 12/25/14  Yes Rushil Sherrye Payor, MD  metFORMIN (GLUCOPHAGE) 500 MG tablet Take 500 mg by mouth 2 (two) times daily with a meal.   Yes Historical Provider, MD  metoCLOPramide (REGLAN) 10 MG tablet Take 1 tablet (10 mg total) by mouth every 8 (eight) hours as needed for nausea or vomiting. 01/05/16  Yes Orpah Greek, MD  buPROPion (WELLBUTRIN XL) 150 MG 24  hr tablet Take 1 tablet (150 mg total) by mouth daily. 11/24/13   Nino Glow McLean-Scocozza, MD  Insulin Pen Needle 32G X 4 MM MISC 1 Units by Does not apply route daily. 11/14/14   Lucious Groves, DO  omeprazole (PRILOSEC) 20 MG capsule Take 1 capsule (20 mg total) by mouth daily. 01/06/16   Harvel Quale, MD  sucralfate (CARAFATE) 1 g tablet Take 1 tablet (1 g total) by mouth 2 (two) times daily. 01/06/16   Harvel Quale, MD   BP 209/119 mmHg  Pulse 99  Temp(Src) 98.3 F (36.8 C)  Resp 25  Ht 5\' 5"  (1.651 m)  Wt 170 lb (77.111 kg)  BMI 28.29 kg/m2  SpO2 100%  LMP 12/17/2015 Physical Exam  Constitutional: She is oriented to person, place, and time. She appears well-developed and well-nourished.  Appears uncomfortable  HENT:  Head: Normocephalic and atraumatic.  Right Ear: External ear normal.  Left Ear: External ear normal.  Nose: Nose normal.  Mouth/Throat: Oropharynx is clear and moist. No oropharyngeal exudate.  Eyes: EOM are normal. Pupils are equal, round, and reactive to light.  Neck: Normal range of motion. Neck supple.  Cardiovascular: Regular rhythm, normal heart sounds and intact distal pulses.  Tachycardia present.   No murmur heard. Pulmonary/Chest: Effort normal. No respiratory distress. She has no wheezes. She has no rales.  Abdominal: Soft. She exhibits no distension. There is tenderness (epigastric mild to moderate).  Musculoskeletal: Normal range of motion. She exhibits no edema or tenderness.  Neurological: She is alert and oriented to person, place, and time.  Skin: Skin is warm and dry. No rash noted. She is not diaphoretic.  Vitals reviewed.   ED Course  Procedures (including critical care time) Labs Review Labs Reviewed  CBC WITH DIFFERENTIAL/PLATELET - Abnormal; Notable for the following:    RBC 3.64 (*)    Hemoglobin 10.1 (*)    HCT 30.3 (*)    All other components within normal limits  COMPREHENSIVE METABOLIC PANEL - Abnormal; Notable for the  following:    Sodium 132 (*)    Potassium 3.0 (*)    Chloride 95 (*)    Glucose, Bld 266 (*)    Creatinine, Ser 1.70 (*)    Calcium 8.8 (*)    Albumin 2.8 (*)    ALT 11 (*)    GFR calc non Af Amer 37 (*)    GFR calc Af Amer 43 (*)    All other components within normal limits  URINALYSIS, ROUTINE W REFLEX MICROSCOPIC (NOT AT [facility]) - Abnormal; Notable for the following:    Glucose, UA 100 (*)    Hgb urine dipstick MODERATE (*)    Ketones, ur 15 (*)    Protein, ur >300 (*)    All other components within normal limits  URINE MICROSCOPIC-ADD ON - Abnormal; Notable for the following:    Squamous Epithelial / LPF 0-5 (*)    Casts HYALINE CASTS (*)    All other components within normal  limits  CBG MONITORING, ED - Abnormal; Notable for the following:    Glucose-Capillary 249 (*)    All other components within normal limits  LIPASE, BLOOD  I-STAT TROPOININ, ED  I-STAT BETA HCG BLOOD, ED ([facility], [facility], [facility] ONLY)    Imaging Review No results found. I have personally reviewed and evaluated these images and lab results as part of my medical decision-making.   EKG Interpretation   Date/Time:  Monday Jan 06 2016 07:59:17 EDT Ventricular Rate:  101 PR Interval:  150 QRS Duration: 100 QT Interval:  366 QTC Calculation: 474 R Axis:   83 Text Interpretation:  Sinus tachycardia Biatrial enlargement Low voltage,  precordial leads Baseline wander in lead(s) I II III aVL aVF V1 V2 V3 No  significant change since last tracing Confirmed by Desmon Hitchner ([id number])  on 01/06/2016 8:10:32 AM      MDM  Patient was seen and evaluated in stable condition. Laboratory studies with mild hypokalemia. Creatinine Unremarkable per patient. Patient was given IV fluids, multiple antiemetics, erythromycin, GI cocktail, Protonix. She was observed in the emergency department for an extended period of time. Reevaluation the patient felt improved and was able to tolerate eating and drinking and requested discharge. She  was discharged home in stable condition with prescriptions for Carafate and omeprazole. She was instructed to continue to take the Reglan that was prescribed on her last visit. Final diagnoses:  Gastritis  Gastroparesis    1. Gastritis 2. Gastroparesis    Harvel Quale, MD 01/08/16 [phone]

## 2016-01-06 NOTE — Discharge Instructions (Signed)
You were seen and evaluated today for your abdominal pain. Likely this has to do with her gastroparesis as well as increased stomach acid and take the medications prescribed. Also take the Reglan which were prescribed on her previous visit this will help with the emptying of your stomach. Please follow-up outpatient with her primary care physician.  Gastroparesis Gastroparesis, also called delayed gastric emptying, is a condition in which food takes longer than normal to empty from the stomach. The condition is usually long-lasting (chronic). CAUSES This condition may be caused by:  An endocrine disorder, such as hypothyroidism or diabetes. Diabetes is the most common cause of this condition.  A nervous system disease, such as Parkinson disease or multiple sclerosis.  Cancer, infection, or surgery of the stomach or vagus nerve.  A connective tissue disorder, such as scleroderma.  Certain medicines. In most cases, the cause is not known. RISK FACTORS This condition is more likely to develop in:  People with certain disorders, including endocrine disorders, eating disorders, amyloidosis, and scleroderma.  People with certain diseases, including Parkinson disease or multiple sclerosis.  People with cancer or infection of the stomach or vagus nerve.  People who have had surgery on the stomach or vagus nerve.  People who take certain medicines.  Women. SYMPTOMS Symptoms of this condition include:  An early feeling of fullness when eating.  Nausea.  Weight loss.  Vomiting.  Heartburn.  Abdominal bloating.  Inconsistent blood glucose levels.  Lack of appetite.  Acid from the stomach coming up into the esophagus (gastroesophageal reflux).  Spasms of the stomach. Symptoms may come and go. DIAGNOSIS This condition is diagnosed with tests, such as:  Tests that check how long it takes food to move through the stomach and intestines. These tests include:  Upper  gastrointestinal (GI) series. In this test, X-rays of the intestines are taken after you drink a liquid. The liquid makes the intestines show up better on the X-rays.  Gastric emptying scintigraphy. In this test, scans are taken after you eat food that contains a small amount of radioactive material.  Wireless capsule GI monitoring system. This test involves swallowing a capsule that records information about movement through the stomach.  Gastric manometry. This test measures electrical and muscular activity in the stomach. It is done with a thin tube that is passed down the throat and into the stomach.  Endoscopy. This test checks for abnormalities in the lining of the stomach. It is done with a long, thin tube that is passed down the throat and into the stomach.  An ultrasound. This test can help rule out gallbladder disease or pancreatitis as a cause of your symptoms. It uses sound waves to take pictures of the inside of your body. TREATMENT There is no cure for gastroparesis. This condition may be managed with:  Treatment of the underlying condition causing the gastroparesis.  Lifestyle changes, including exercise and dietary changes. Dietary changes can include:  Changes in what and when you eat.  Eating smaller meals more often.  Eating low-fat foods.  Eating low-fiber forms of high-fiber foods, such as cooked vegetables instead of raw vegetables.  Having liquid foods in place of solid foods. Liquid foods are easier to digest.  Medicines. These may be given to control nausea and vomiting and to stimulate stomach muscles.  Getting food through a feeding tube. This may be done in severe cases.  A gastric neurostimulator. This is a device that is inserted into the body with surgery. It helps  improve stomach emptying and control nausea and vomiting. HOME CARE INSTRUCTIONS  Follow your health care provider's instructions about exercise and diet.  Take medicines only as directed  by your health care provider. SEEK MEDICAL CARE IF:  Your symptoms do not improve with treatment.  You have new symptoms. SEEK IMMEDIATE MEDICAL CARE IF:  You have severe abdominal pain that does not improve with treatment.  You have nausea that does not go away.  You cannot keep fluids down.   This information is not intended to replace advice given to you by your health care provider. Make sure you discuss any questions you have with your health care provider.   Document Released: 07/27/2005 Document Revised: 12/11/2014 Document Reviewed: 07/23/2014 Elsevier Interactive Patient Education 2016 Elsevier Inc.  Gastritis, Adult Gastritis is soreness and swelling (inflammation) of the lining of the stomach. Gastritis can develop as a sudden onset (acute) or long-term (chronic) condition. If gastritis is not treated, it can lead to stomach bleeding and ulcers. CAUSES  Gastritis occurs when the stomach lining is weak or damaged. Digestive juices from the stomach then inflame the weakened stomach lining. The stomach lining may be weak or damaged due to viral or bacterial infections. One common bacterial infection is the Helicobacter pylori infection. Gastritis can also result from excessive alcohol consumption, taking certain medicines, or having too much acid in the stomach.  SYMPTOMS  In some cases, there are no symptoms. When symptoms are present, they may include:  Pain or a burning sensation in the upper abdomen.  Nausea.  Vomiting.  An uncomfortable feeling of fullness after eating. DIAGNOSIS  Your caregiver may suspect you have gastritis based on your symptoms and a physical exam. To determine the cause of your gastritis, your caregiver may perform the following:  Blood or stool tests to check for the H pylori bacterium.  Gastroscopy. A thin, flexible tube (endoscope) is passed down the esophagus and into the stomach. The endoscope has a light and camera on the end. Your  caregiver uses the endoscope to view the inside of the stomach.  Taking a tissue sample (biopsy) from the stomach to examine under a microscope. TREATMENT  Depending on the cause of your gastritis, medicines may be prescribed. If you have a bacterial infection, such as an H pylori infection, antibiotics may be given. If your gastritis is caused by too much acid in the stomach, H2 blockers or antacids may be given. Your caregiver may recommend that you stop taking aspirin, ibuprofen, or other nonsteroidal anti-inflammatory drugs (NSAIDs). HOME CARE INSTRUCTIONS  Only take over-the-counter or prescription medicines as directed by your caregiver.  If you were given antibiotic medicines, take them as directed. Finish them even if you start to feel better.  Drink enough fluids to keep your urine clear or pale yellow.  Avoid foods and drinks that make your symptoms worse, such as:  Caffeine or alcoholic drinks.  Chocolate.  Peppermint or mint flavorings.  Garlic and onions.  Spicy foods.  Citrus fruits, such as oranges, lemons, or limes.  Tomato-based foods such as sauce, chili, salsa, and pizza.  Fried and fatty foods.  Eat small, frequent meals instead of large meals. SEEK IMMEDIATE MEDICAL CARE IF:   You have black or dark red stools.  You vomit blood or material that looks like coffee grounds.  You are unable to keep fluids down.  Your abdominal pain gets worse.  You have a fever.  You do not feel better after 1 week.  You have any other questions or concerns. MAKE SURE YOU:  Understand these instructions.  Will watch your condition.  Will get help right away if you are not doing well or get worse.   This information is not intended to replace advice given to you by your health care provider. Make sure you discuss any questions you have with your health care provider.   Document Released: 07/21/2001 Document Revised: 01/26/2012 Document Reviewed:  09/09/2011 Elsevier Interactive Patient Education [employer].

## 2016-03-27 ENCOUNTER — Ambulatory Visit (HOSPITAL_COMMUNITY)
Admission: EM | Admit: 2016-03-27 | Discharge: 2016-03-27 | Disposition: A | Discharge status: Home / Self Care | Payer: Medicaid Other | Attending: Emergency Medicine | Admitting: Emergency Medicine

## 2016-03-27 ENCOUNTER — Encounter (HOSPITAL_COMMUNITY): Payer: Self-pay | Admitting: *Deleted

## 2016-03-27 DIAGNOSIS — L989 Disorder of the skin and subcutaneous tissue, unspecified: Secondary | ICD-10-CM

## 2016-03-27 DIAGNOSIS — R238 Other skin changes: Secondary | ICD-10-CM

## 2016-03-27 MED ORDER — TRIAMCINOLONE ACETONIDE 0.1 % EX CREA: 1.0000 "application " | TOPICAL_CREAM | Freq: Two times a day (BID) | CUTANEOUS | 0 refills | Status: DC

## 2016-03-27 NOTE — ED Triage Notes (Signed)
Pt is  Kathy Frank    Diabetic      Who has  Kathy Frank  blistrer     On    Her  l foot     X  2 weeks   Her  Blood  Sugars  Have  Been  Elevated         She states  The  Levels  have  Been   High   She  Has  Dry  Skin      And

## 2016-03-27 NOTE — ED Provider Notes (Signed)
CSN: [id number]     Arrival date & time 03/27/16  1345 History   First MD Initiated Contact with Patient 03/27/16 1447     Chief Complaint  Patient presents with  . Foot Pain   (Consider location/radiation/quality/duration/timing/severity/associated sxs/prior Treatment) 40 year old female with type 2 diabetes insulin treated presents to the urgent care with concern for a small dark raised area to the top of the left foot for 2 weeks. It is mildly sore but no pain. She has noticed some clear drainage oozing from this lesion. Denies fever, chills or other systemic symptoms.  Her other concern are small flesh-colored papules to the hands and forearms. These are pruritic. She states she applied an anti-scabies type medicine twice and that did not seem to help.      Past Medical History:  Diagnosis Date  . Cyclical vomiting    THC induced???  . Depression   . DKA (diabetic ketoacidoses) ([place]) 12/2014  . Erosive esophagitis   . Gastroparesis    ? diabetic  . Hypertension   . Normocytic anemia   . Type II diabetes mellitus ([place])    diagnosed around 1994   Past Surgical History:  Procedure Laterality Date  . TUBAL LIGATION  2002   History reviewed. No pertinent family history. Social History  Substance Use Topics  . Smoking status: Former Smoker    Packs/day: 0.10    Years: 10.00    Types: Cigarettes    Start date: 11/17/2013    Quit date: 09/09/2014  . Smokeless tobacco: Never Used  . Alcohol use Yes     Comment: 11/13/2013 "occasionally; once or twice/month"   OB History    No data available     Review of Systems  Constitutional: Negative.   HENT: Negative.   Respiratory: Negative.   Cardiovascular: Negative for chest pain and leg swelling.  Gastrointestinal: Negative.   Musculoskeletal: Negative.   Skin:       As per history of present illness  Neurological: Negative.   All other systems reviewed and are negative.   Allergies  Review of patient's allergies  indicates no known allergies.  Home Medications   Prior to Admission medications   Medication Sig Start Date End Date Taking? Authorizing Provider  amLODipine (NORVASC) 5 MG tablet Take 5 mg by mouth daily.    Historical Provider, MD  buPROPion (WELLBUTRIN XL) 150 MG 24 hr tablet Take 1 tablet (150 mg total) by mouth daily. 11/24/13   Nino Glow McLean-Scocuzza, MD  Insulin Glargine (LANTUS) 100 UNIT/ML Solostar Pen Inject 15 Units into the skin daily at 10 pm. Patient taking differently: Inject 15 Units into the skin at bedtime.  11/14/14   Lucious Groves, DO  Insulin Pen Needle 32G X 4 MM MISC 1 Units by Does not apply route daily. 11/14/14   Lucious Groves, DO  lisinopril (PRINIVIL,ZESTRIL) 20 MG tablet Take 1 tablet (20 mg total) by mouth daily. 12/25/14   Riccardo Dubin, MD  metFORMIN (GLUCOPHAGE) 500 MG tablet Take 500 mg by mouth 2 (two) times daily with a meal.    Historical Provider, MD  metoCLOPramide (REGLAN) 10 MG tablet Take 1 tablet (10 mg total) by mouth every 8 (eight) hours as needed for nausea or vomiting. 01/05/16   Orpah Greek, MD  omeprazole (PRILOSEC) 20 MG capsule Take 1 capsule (20 mg total) by mouth daily. 01/06/16   Harvel Quale, MD  sucralfate (CARAFATE) 1 g tablet Take 1 tablet (1 g total)  by mouth 2 (two) times daily. 01/06/16   Harvel Quale, MD  triamcinolone cream (KENALOG) 0.1 % Apply 1 application topically 2 (two) times daily. 03/27/16   Janne Napoleon, NP   Meds Ordered and Administered this Visit  Medications - No data to display  BP 144/93 (BP Location: Left Arm)   Pulse 89   Temp 98.9 F (37.2 C) (Oral)   Resp 16   LMP 03/02/2016   SpO2 100%  No data found.   Physical Exam  Constitutional: She is oriented to person, place, and time. She appears well-developed and well-nourished. No distress.  Neck: Normal range of motion. Neck supple.  Cardiovascular: Normal rate.   Pulmonary/Chest: Effort normal.  Musculoskeletal: Normal range of motion. She  exhibits no edema.  Neurological: She is alert and oriented to person, place, and time.  Skin: Skin is warm and dry.  Located over the dorsum of the left distal forefoot there is a 1.5 x 1.5 cm slightly raised and darkly pigmented lesion with points of clear liquid at the surface. Under magnification there are very small vesicles that are draining a clear liquid. These vesicles are tightly grouped and interlaced with a smooth tissue giving the appearance described above. No signs of infection. No erythema, no swelling no red streaks no swelling of the foot.  There are many flesh-colored solid papules to the dorsum of the hands and a few in the interdigital webspaces. No drainage. No signs of infection. No cellulitis. No bleeding.  Nursing note and vitals reviewed.   Urgent Care Course   Clinical Course    Procedures (including critical care time)  Labs Review Labs Reviewed - No data to display  Imaging Review No results found.   Visual Acuity Review  Right Eye Distance:   Left Eye Distance:   Bilateral Distance:    Right Eye Near:   Left Eye Near:    Bilateral Near:         MDM   1. Skin lesion   2. Papules   The papules to the hands and forearms appear to be more like insect bites. Another differential would include a variation of dyshidrotic eczema. The lesion to the dorsum of the left foot is not infectious. Under magnification and lighting the surface appears to be inflammatory. This is not a single vesicle. There are a few smaller papular satellite lesions. Etiology unknown. To follow-up with PCP if not getting better in the next few days. Apply the cream twice a day to the foot and bumps. Cool compresses. Keep clean with soap and water.  Meds ordered this encounter  Medications  . triamcinolone cream (KENALOG) 0.1 %    Sig: Apply 1 application topically 2 (two) times daily.    Dispense:  30 g    Refill:  0    Order Specific Question:   Supervising Provider     Answer:   Sherlene Shams [id number]       Janne Napoleon, NP 03/27/16 1517

## 2016-03-27 NOTE — Discharge Instructions (Signed)
Apply the cream twice a day to the foot and bumps. Cool compresses. Keep clean with soap and water.

## 2016-04-21 ENCOUNTER — Ambulatory Visit (INDEPENDENT_AMBULATORY_CARE_PROVIDER_SITE_OTHER): Payer: Medicaid Other | Admitting: Internal Medicine

## 2016-04-21 DIAGNOSIS — L259 Unspecified contact dermatitis, unspecified cause: Secondary | ICD-10-CM

## 2016-04-21 DIAGNOSIS — Z23 Encounter for immunization: Secondary | ICD-10-CM

## 2016-04-21 DIAGNOSIS — Z79899 Other long term (current) drug therapy: Secondary | ICD-10-CM

## 2016-04-21 DIAGNOSIS — I10 Essential (primary) hypertension: Secondary | ICD-10-CM | POA: Diagnosis not present

## 2016-04-21 DIAGNOSIS — Z87891 Personal history of nicotine dependence: Secondary | ICD-10-CM

## 2016-04-21 DIAGNOSIS — R21 Rash and other nonspecific skin eruption: Secondary | ICD-10-CM | POA: Diagnosis not present

## 2016-04-21 HISTORY — DX: Unspecified contact dermatitis, unspecified cause: L25.9

## 2016-04-21 MED ORDER — AMLODIPINE BESYLATE 5 MG PO TABS: 5.0000 mg | ORAL_TABLET | Freq: Every day | ORAL | 3 refills | Status: DC

## 2016-04-21 MED ORDER — LISINOPRIL 20 MG PO TABS: 20.0000 mg | ORAL_TABLET | Freq: Every day | ORAL | 3 refills | Status: DC

## 2016-04-21 MED ORDER — LISINOPRIL 20 MG PO TABS
20.0000 mg | ORAL_TABLET | Freq: Every day | ORAL | 3 refills | Status: DC
Start: 2016-04-21 — End: 2016-04-21

## 2016-04-21 NOTE — Patient Instructions (Signed)
Thank you for visiting our clinic!   Today we check some blood work, provided refills for your blood pressure medication, and provided a referral to dermatology. Continue to keep your rash clean and dry. If it becomes red, painful, begins draining cloudy fluid, or you develop fevers/chills - please call us right away or visit the ER. We look forward to seeing you next month.

## 2016-04-21 NOTE — Assessment & Plan Note (Addendum)
Presenting for follow up of ongoing papulovesicular rash that continues to spread. This rash began on her left foot in early August and has progressively spread to cover her lower legs, forearms, elbows, hands, feet with scattered hyperpigmented papular/vesicular lesions. She was seen in the ED on August 18 for this rash was prescribed triamcinolone cream. The rash has not improved with triamcinolone, which she applied twice daily several weeks. The rash is not painful or pruritic, and two areas on her left foot are draining clear fluid. She denies contact with similar symptoms, fevers, chills, insect bites, or medication changes. Photograph of rash placed in chart - rash etiology unclear and appearance did not fit any classic recognizable diagnosis known to our clinic team.  Assessment: spreading papulovesicular rash of unclear etiology  Plan: - Check HIV, HCV, herpes culture of collected draining fluid - Provided ambulatory referral to dermatology - Follow up with PCP next month

## 2016-04-21 NOTE — Assessment & Plan Note (Signed)
Blood pressure elevated at 168/98 at today's visit - patient has been out of antihypertensive medications for 2-3 weeks - requesting refills.  Plan: - Provided refills for Lisinopril and Amlodipine - Follow up control at PCP visit next month

## 2016-04-21 NOTE — Progress Notes (Signed)
I saw and evaluated the patient. I personally confirmed the key portions of Dr. Durenda Age history and exam and reviewed pertinent patient test results. The assessment, diagnosis, and plan were formulated together and I agree with the documentation in the resident's note.

## 2016-04-21 NOTE — Progress Notes (Signed)
   CC: Rash  HPI:  Kathy Frank is a 40 y.o. female with PMHx detailed below presenting with a progressive non-painful, non-pruritic papulovesicular rash that began on her left foot 4 weeks ago but has began to spread over her lower legs, elbows, forearms, and hands.   See problem based assessment and plan below for additional details.  Past Medical History:  Diagnosis Date  . Cyclical vomiting    THC induced???  . Depression   . DKA (diabetic ketoacidoses) ([place]) 12/2014  . Erosive esophagitis   . Gastroparesis    ? diabetic  . Hypertension   . Normocytic anemia   . Type II diabetes mellitus ([place])    diagnosed around 1994    Review of Systems: Review of Systems  Constitutional: Negative for chills, fever and malaise/fatigue.  Respiratory: Negative for shortness of breath.   Cardiovascular: Negative for chest pain.  Gastrointestinal: Positive for abdominal pain and nausea.  Musculoskeletal: Negative for myalgias.   Physical Exam: Vitals:   04/21/16 1029  BP: (!) 168/98  Pulse: 86  Temp: 98.2 F (36.8 C)  TempSrc: Oral  SpO2: 100%  Weight: 173 lb 12.8 oz (78.8 kg)   GENERAL- Woman sitting comfortably in exam room chair, alert, in no distress, tattoos present over upper extremities HEENT- Atraumatic, PERRL, EOMI, moist mucous membranes, poor dentition, subtle papular lesions scattered over nasolabial folds CARDIAC- Regular rate and rhythm, no murmurs, rubs or gallops. RESP- Clear to ascultation bilaterally, no wheezing or crackles, normal work of breathing ABDOMEN- Normoactive bowel sounds, soft, nontender, nondistended EXTREMITIES- Normal bulk and range of motion, no edema, 2+ peripheral pulses SKIN- Warm, dry, scattered hyperpigmented papular lesions over bilateral lower extremities, feet, and upper forearms and hands, confluence of papular lesions at two sites on dorsal left foot with vesicular component and associated clear discharge present, non-painful,  non-pruritic PSYCH- Appropriate affect, clear speech, thoughts linear and goal-directed  Photo(s) of rash:      Assessment & Plan:   See encounters tab for problem based medical decision making.  Patient seen with Dr. Eppie Gibson

## 2016-04-22 LAB — HIV 1/2 AB DIFFERENTIATION
HIV 1 Ab: POSITIVE — AB
HIV 2 Ab: NEGATIVE

## 2016-04-22 LAB — HIV ANTIBODY (ROUTINE TESTING W REFLEX)

## 2016-04-22 LAB — HEPATITIS C ANTIBODY: Hep C Virus Ab: 0.1 s/co ratio (ref 0.0–0.9)

## 2016-04-23 ENCOUNTER — Telehealth: Payer: Self-pay | Admitting: *Deleted

## 2016-04-23 ENCOUNTER — Ambulatory Visit (INDEPENDENT_AMBULATORY_CARE_PROVIDER_SITE_OTHER): Payer: Medicaid Other | Admitting: Internal Medicine

## 2016-04-23 ENCOUNTER — Other Ambulatory Visit (HOSPITAL_COMMUNITY)
Admission: RE | Admit: 2016-04-23 | Discharge: 2016-04-23 | Disposition: A | Discharge status: Home / Self Care | Payer: Medicaid Other | Source: Ambulatory Visit | Attending: Internal Medicine | Admitting: Internal Medicine

## 2016-04-23 ENCOUNTER — Encounter: Payer: Self-pay | Admitting: Internal Medicine

## 2016-04-23 VITALS — BP 186/94 | HR 94 | Temp 98.3°F | Ht 65.0 in | Wt 175.5 lb

## 2016-04-23 DIAGNOSIS — I10 Essential (primary) hypertension: Secondary | ICD-10-CM | POA: Diagnosis not present

## 2016-04-23 DIAGNOSIS — Z87891 Personal history of nicotine dependence: Secondary | ICD-10-CM | POA: Diagnosis not present

## 2016-04-23 DIAGNOSIS — Z113 Encounter for screening for infections with a predominantly sexual mode of transmission: Secondary | ICD-10-CM | POA: Diagnosis not present

## 2016-04-23 DIAGNOSIS — Z21 Asymptomatic human immunodeficiency virus [HIV] infection status: Secondary | ICD-10-CM | POA: Diagnosis not present

## 2016-04-23 DIAGNOSIS — E1122 Type 2 diabetes mellitus with diabetic chronic kidney disease: Secondary | ICD-10-CM

## 2016-04-23 DIAGNOSIS — B20 Human immunodeficiency virus [HIV] disease: Secondary | ICD-10-CM

## 2016-04-23 DIAGNOSIS — R21 Rash and other nonspecific skin eruption: Secondary | ICD-10-CM

## 2016-04-23 DIAGNOSIS — E1165 Type 2 diabetes mellitus with hyperglycemia: Secondary | ICD-10-CM

## 2016-04-23 DIAGNOSIS — Z794 Long term (current) use of insulin: Secondary | ICD-10-CM

## 2016-04-23 HISTORY — DX: Human immunodeficiency virus (HIV) disease: B20

## 2016-04-23 LAB — POCT GLYCOSYLATED HEMOGLOBIN (HGB A1C): Hemoglobin A1C: 7.2

## 2016-04-23 LAB — POCT URINE PREGNANCY: Preg Test, Ur: NEGATIVE

## 2016-04-23 LAB — GLUCOSE, CAPILLARY: Glucose-Capillary: 144 mg/dL — ABNORMAL HIGH (ref 65–99)

## 2016-04-23 MED ORDER — ELVITEG-COBIC-EMTRICIT-TENOFAF 150-150-200-10 MG PO TABS: 1.0000 | ORAL_TABLET | Freq: Every day | ORAL | 3 refills | Status: DC

## 2016-04-23 NOTE — Assessment & Plan Note (Addendum)
Rash appears to be improving, less visible over upper extremities and left foot. HSV result still pending. May be symptom of primary HIV infection although patient not having any constitutional symptoms.  Plan: - Follow HSV results, if positive start Valacyclovir  Addendum: HSV negative

## 2016-04-23 NOTE — Assessment & Plan Note (Addendum)
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 Had been previously seronegative when checked 2.5 years ago (11/2013). Screened in setting of new evolving rash. Discussed this diagnosis with the patient today, who was understandably shocked and tearful. Discussed next steps and the treatment plan and appointment we have made for her.   Assessment: Newly diagnosed HIV, unknown CD4 count or level of disease activity  Plan: - Appointment made to see ID/Dr. Johnnye Sima next week - Start Genvoya tablet QD - Labs:  CD4 HIV 1 RNA Quant HIV-1 Genotyping HLA [id number] Hepatitis panel RPR Urine GC/Chlamydia Urine pregnancy Urinalysis CBC with diff CMP Lipid panel Hgb A1c  - Depending on CD4 count may need to Rx Bactrim and/or Azithromycin

## 2016-04-23 NOTE — Assessment & Plan Note (Signed)
Hypertensive to 186/94 today, failed to recheck, takes Lisinopril 20 mg and Amlodipine 5 mg daily.  Plan: - Reassess BP control at next visit, may require dose adjustments or change to combination lisinopril-hctz

## 2016-04-23 NOTE — Progress Notes (Signed)
   CC: Newly diagnosed HIV  HPI:  [name] is a 40 y.o. female with PMHx detailed below presenting for acute return to clinic after recent HIV test came back positive, requiring additional lab testing, discussion, and initiation of ART.  See problem based assessment and plan below for additional details.  Past Medical History:  Diagnosis Date  . Cyclical vomiting    THC induced???  . Depression   . DKA (diabetic ketoacidoses) ([place]) 12/2014  . Erosive esophagitis   . Gastroparesis    ? diabetic  . Hypertension   . Normocytic anemia   . Type II diabetes mellitus ([place])    diagnosed around 1994   Review of Systems: Review of Systems  Constitutional: Negative for fever.  Respiratory: Negative for shortness of breath.   Cardiovascular: Negative for chest pain.  Gastrointestinal: Negative for abdominal pain.  Skin: Positive for rash. Negative for itching.  Neurological: Negative for headaches.    Physical Exam: Vitals:   04/23/16 1409  BP: (!) 186/94  Pulse: 94  Temp: 98.3 F (36.8 C)  TempSrc: Oral  SpO2: 100%  Weight: 175 lb 8 oz (79.6 kg)  Height: 5\' 5"  (1.651 m)   Body mass index is 29.2 kg/m.   GENERAL- Woman sitting comfortably in exam room chair, alert, in no distress HEENT- Atraumatic, PERRL, EOMI, moist mucous membranes CARDIAC- Regular rate and rhythm, no murmurs, rubs or gallops. RESP- Clear to ascultation bilaterally, no wheezing or crackles, normal work of breathing ABDOMEN- Normoactive bowel sounds, soft, nontender, nondistended EXTREMITIES- Normal bulk and range of motion, no edema, 2+ peripheral pulses SKIN- Warm, dry, intact, scattered papular lesions less prominent over forearms PSYCH- Tearful affect, clear speech, thoughts linear and goal-directed  Assessment & Plan:   See encounters tab for problem based medical decision making.  Patient seen with Dr. Eppie Gibson

## 2016-04-23 NOTE — Telephone Encounter (Signed)
Received call from [facility])-per protocol all HIV+ results are reported to the Health Dept.  He wanted to know if pt was pregnant and if [facility] office had checked pt for syphilis.  They also wanted an emergency contact number for patient as they will be reaching out to call her at some point.  I asked that call be made on Monday as to give our office a chance to inform her of results during her appt today.  [facility] Dept also wanted to make sure [facility] was going to make referral to ID clinic.  I was also given the following contact info it pt wishes to make contact with [facility] prior to their call. Pt can call [phone] and ask for [facility]. Phone call complete.Regenia Skeeter, Ariyana Faw Cassady9/14/201710:25 AM

## 2016-04-23 NOTE — Patient Instructions (Addendum)
Thank you for visiting [place] in clinic today.   Today we discussed your new diagnosis and collected samples to run some additional lab tests and started a new medication.  We have prescribed Genvoya, a pill you should take once daily with breakfast. Please continue to take your other medications as usual.  We have scheduled you to see an experienced specialist (Dr. Johnnye Sima), you appointment with him is next Wednesday 9/20 at 9:30AM.  If you have any questions or concerns, please call the clinic at [phone] or if it is the weekend or after hours, you may call [phone] and ask for the internal medicine resident on call.  We are here for you and we will fight this diagnosis together.

## 2016-04-24 ENCOUNTER — Ambulatory Visit: Payer: Medicaid Other

## 2016-04-24 LAB — LIPID PANEL
Chol/HDL Ratio: 3.7 ratio units (ref 0.0–4.4)
Cholesterol, Total: 201 mg/dL — ABNORMAL HIGH (ref 100–199)
HDL: 54 mg/dL (ref >39)
LDL Calculated: 121 mg/dL — ABNORMAL HIGH (ref 0–99)
Triglycerides: 131 mg/dL (ref 0–149)
VLDL Cholesterol Cal: 26 mg/dL (ref 5–40)

## 2016-04-24 LAB — CMP14 + ANION GAP
ALT: 32 IU/L (ref 0–32)
AST: 25 IU/L (ref 0–40)
Albumin/Globulin Ratio: 0.9 — ABNORMAL LOW (ref 1.2–2.2)
Albumin: 3.4 g/dL — ABNORMAL LOW (ref 3.5–5.5)
Alkaline Phosphatase: 102 IU/L (ref 39–117)
Anion Gap: 19 mmol/L — ABNORMAL HIGH (ref 10.0–18.0)
BUN/Creatinine Ratio: 7 — ABNORMAL LOW (ref 9–23)
BUN: 12 mg/dL (ref 6–20)
Bilirubin Total: 0.2 mg/dL (ref 0.0–1.2)
CO2: 18 mmol/L (ref 18–29)
Calcium: 8.7 mg/dL (ref 8.7–10.2)
Chloride: 100 mmol/L (ref 96–106)
Creatinine, Ser: 1.66 mg/dL — ABNORMAL HIGH (ref 0.57–1.00)
GFR calc Af Amer: 44 mL/min/{1.73_m2} — ABNORMAL LOW (ref >59)
GFR calc non Af Amer: 39 mL/min/{1.73_m2} — ABNORMAL LOW (ref >59)
Globulin, Total: 3.9 g/dL (ref 1.5–4.5)
Glucose: 151 mg/dL — ABNORMAL HIGH (ref 65–99)
Potassium: 3.6 mmol/L (ref 3.5–5.2)
Sodium: 137 mmol/L (ref 134–144)
Total Protein: 7.3 g/dL (ref 6.0–8.5)

## 2016-04-24 LAB — URINALYSIS, ROUTINE W REFLEX MICROSCOPIC
Bilirubin, UA: NEGATIVE
Ketones, UA: NEGATIVE
Leukocytes, UA: NEGATIVE
Nitrite, UA: NEGATIVE
RBC, UA: NEGATIVE
Specific Gravity, UA: 1.02 (ref 1.005–1.030)
Urobilinogen, Ur: 0.2 mg/dL (ref 0.2–1.0)
pH, UA: 5.5 (ref 5.0–7.5)

## 2016-04-24 LAB — CBC WITH DIFFERENTIAL/PLATELET
Basophils Absolute: 0 10*3/uL (ref 0.0–0.2)
Basos: 1 %
EOS (ABSOLUTE): 0.2 10*3/uL (ref 0.0–0.4)
Eos: 4 %
Hematocrit: 31.6 % — ABNORMAL LOW (ref 34.0–46.6)
Hemoglobin: 10.5 g/dL — ABNORMAL LOW (ref 11.1–15.9)
Immature Grans (Abs): 0 10*3/uL (ref 0.0–0.1)
Immature Granulocytes: 0 %
Lymphocytes Absolute: 1.2 10*3/uL (ref 0.7–3.1)
Lymphs: 36 %
MCH: 27.9 pg (ref 26.6–33.0)
MCHC: 33.2 g/dL (ref 31.5–35.7)
MCV: 84 fL (ref 79–97)
Monocytes Absolute: 0.2 10*3/uL (ref 0.1–0.9)
Monocytes: 7 %
Neutrophils Absolute: 1.8 10*3/uL (ref 1.4–7.0)
Neutrophils: 52 %
Platelets: 242 10*3/uL (ref 150–379)
RBC: 3.77 x10E6/uL (ref 3.77–5.28)
RDW: 15.3 % (ref 12.3–15.4)
WBC: 3.4 10*3/uL (ref 3.4–10.8)

## 2016-04-24 LAB — URINE CYTOLOGY ANCILLARY ONLY
Chlamydia: NEGATIVE
Neisseria Gonorrhea: NEGATIVE

## 2016-04-24 LAB — HEPATITIS PANEL, ACUTE
Hep A IgM: NEGATIVE
Hep B C IgM: NEGATIVE
Hep C Virus Ab: <0.1 s/co ratio (ref 0.0–0.9)
Hepatitis B Surface Ag: NEGATIVE

## 2016-04-24 LAB — MICROSCOPIC EXAMINATION
Bacteria, UA: NONE SEEN
Epithelial Cells (non renal): >10 /hpf — AB (ref 0–10)
RBC, UA: NONE SEEN /hpf (ref 0–?)
WBC, UA: NONE SEEN /hpf (ref 0–?)

## 2016-04-24 LAB — T-HELPER CELLS (CD4) COUNT (NOT AT ARMC)
CD4 % Helper T Cell: 18 % — ABNORMAL LOW (ref 33–55)
CD4 T Cell Abs: 220 /uL — ABNORMAL LOW (ref 400–2700)

## 2016-04-24 LAB — HERPES SIMPLEX VIRUS CULTURE

## 2016-04-24 LAB — RPR: RPR Ser Ql: NONREACTIVE

## 2016-04-27 LAB — QUANTIFERON TB GOLD ASSAY (BLOOD)

## 2016-04-27 LAB — QUANTIFERON IN TUBE
QFT TB AG MINUS NIL VALUE: <0 IU/mL
QUANTIFERON MITOGEN VALUE: 5.17 IU/mL
QUANTIFERON TB AG VALUE: 0.18 IU/mL
QUANTIFERON TB GOLD: NEGATIVE
Quantiferon Nil Value: 0.19 IU/mL

## 2016-04-28 NOTE — Progress Notes (Signed)
I saw and evaluated the patient.  I personally confirmed the key portions of Dr. Durenda Age history and exam and reviewed pertinent patient test results.  The assessment, diagnosis, and plan were formulated together and I agree with the documentation in the resident's note.

## 2016-04-29 ENCOUNTER — Ambulatory Visit (INDEPENDENT_AMBULATORY_CARE_PROVIDER_SITE_OTHER): Payer: Medicaid Other | Admitting: Infectious Diseases

## 2016-04-29 ENCOUNTER — Encounter: Payer: Self-pay | Admitting: Infectious Diseases

## 2016-04-29 ENCOUNTER — Ambulatory Visit: Payer: Medicaid Other | Admitting: *Deleted

## 2016-04-29 DIAGNOSIS — F32A Depression, unspecified: Secondary | ICD-10-CM

## 2016-04-29 DIAGNOSIS — Z794 Long term (current) use of insulin: Secondary | ICD-10-CM | POA: Diagnosis not present

## 2016-04-29 DIAGNOSIS — R809 Proteinuria, unspecified: Secondary | ICD-10-CM | POA: Diagnosis not present

## 2016-04-29 DIAGNOSIS — E1122 Type 2 diabetes mellitus with diabetic chronic kidney disease: Secondary | ICD-10-CM | POA: Diagnosis not present

## 2016-04-29 DIAGNOSIS — F329 Major depressive disorder, single episode, unspecified: Secondary | ICD-10-CM

## 2016-04-29 DIAGNOSIS — E1165 Type 2 diabetes mellitus with hyperglycemia: Secondary | ICD-10-CM

## 2016-04-29 DIAGNOSIS — B20 Human immunodeficiency virus [HIV] disease: Secondary | ICD-10-CM | POA: Diagnosis not present

## 2016-04-29 NOTE — Assessment & Plan Note (Signed)
Appreciate IM f/u.

## 2016-04-29 NOTE — Assessment & Plan Note (Addendum)
My great appreciation to [facility].  Will continue genvoya. I believe some of her skin changes are due to HIV, I am not sure of the blistering lesions. She needs derm.  Will f/u her need for PAP at next visit.  Offered/refused condoms.  Will see her back in 1 month Will have her see Leveda Anna (mental health), Delories Heinz (HIV home health), Belenda Cruise (THP case mgr).

## 2016-04-29 NOTE — Progress Notes (Signed)
   Subjective:    Patient ID: Kathy Frank, female    DOB: 1976/03/06, 40 y.o.   MRN: [id number]  HPI 40 yo F with hx of HTN, DM1 (since 1994), recently seen in [facility] for rash and had HIV+ test.  Has been tired, not sleeping since dx.  Was started on genvoya at dx. Has been doing well with this. Taking with food.  Feels like her rash is better.  No DM related problems- no neuropathy, vision is worse at night (blurry).  Has had trich prior.   The past medical history, family history and social history were reviewed/updated in EPIC Drinks ETOH very rarely due to it worsening her acid reflux.  Kids are 20, 17, 15.   CD4 T Cell Abs (/uL)  Date Value  04/23/2016 220 (L)      Review of Systems  Constitutional: Positive for appetite change and unexpected weight change.  HENT: Negative for mouth sores.   Eyes: Positive for visual disturbance.  Respiratory: Negative for shortness of breath.   Gastrointestinal: Positive for constipation. Negative for diarrhea.  Genitourinary: Negative for difficulty urinating and genital sores.  Neurological: Negative for dizziness, numbness and headaches.  Attributes wt loss to gastroparesis .     Objective:   Physical Exam  Constitutional: She appears well-developed and well-nourished.  HENT:  Mouth/Throat: No oropharyngeal exudate.  Eyes: EOM are normal. Pupils are equal, round, and reactive to light.  Neck: Neck supple.  Cardiovascular: Normal rate, regular rhythm and normal heart sounds.   Pulmonary/Chest: Effort normal and breath sounds normal.  Abdominal: Soft. Bowel sounds are normal. There is no tenderness. There is no rebound.  Musculoskeletal: She exhibits no edema.  Lymphadenopathy:    She has no cervical adenopathy.  Skin:             Assessment & Plan:

## 2016-04-29 NOTE — Assessment & Plan Note (Signed)
Will have her seen by our counselor today.

## 2016-04-29 NOTE — BH Specialist Note (Signed)
Counselor met with Kathy Frank today in the exam room for a warm hand off.  Counselor made necessary introductions and explained how counseling services work here at [facility].  Patient was oriented times four with sad and tearful affect.  Patient was alert and talkative.  Patient shared that she just can't believe that she is now HIV positive.  Patient began to softly cry wiping her eyes constantly.  Patient said that she is experiencing uncontrollable feelings about everything.  Counselor provided support and encouragement for patient.  Counselor educated patient to reassure her about her HIV and the typical process with adherence to medication and treatment. Counselor recommended that patient come for counseling to assist her with her diagnosis and the process that follows.  Counselor also invited patient to support group and provided a hand out to remind her of the dates and times.   Rolena Infante, MA, LPC [facility]

## 2016-04-29 NOTE — Assessment & Plan Note (Signed)
Increased Cr, Proteinuria. May be due to both her ACE-I, DM.

## 2016-05-01 LAB — HLA B*5701: Result: NEGATIVE

## 2016-05-07 LAB — REFLEX TO GENOSURE(R) MG

## 2016-05-07 LAB — HIV-1 RNA ULTRAQUANT REFLEX TO GENTYP+
HIV1 RNA # SerPl PCR: 19200 copies/mL
HIV1 RNA Plas PCR-Log#: 4.283 log10copy/mL

## 2016-05-13 ENCOUNTER — Encounter: Payer: Self-pay | Admitting: Licensed Clinical Social Worker

## 2016-05-20 ENCOUNTER — Other Ambulatory Visit: Payer: Medicaid Other

## 2016-05-21 ENCOUNTER — Telehealth: Payer: Self-pay | Admitting: Internal Medicine

## 2016-05-21 NOTE — Telephone Encounter (Signed)
APT. REMINDER CALL, LMTCB °

## 2016-05-22 ENCOUNTER — Ambulatory Visit (INDEPENDENT_AMBULATORY_CARE_PROVIDER_SITE_OTHER): Payer: Medicaid Other | Admitting: Internal Medicine

## 2016-05-22 ENCOUNTER — Encounter: Payer: Self-pay | Admitting: Internal Medicine

## 2016-05-22 VITALS — BP 134/79 | HR 83 | Temp 98.0°F | Ht 65.0 in | Wt 174.4 lb

## 2016-05-22 DIAGNOSIS — E1129 Type 2 diabetes mellitus with other diabetic kidney complication: Secondary | ICD-10-CM

## 2016-05-22 DIAGNOSIS — N289 Disorder of kidney and ureter, unspecified: Secondary | ICD-10-CM | POA: Diagnosis not present

## 2016-05-22 DIAGNOSIS — G479 Sleep disorder, unspecified: Secondary | ICD-10-CM

## 2016-05-22 DIAGNOSIS — R21 Rash and other nonspecific skin eruption: Secondary | ICD-10-CM

## 2016-05-22 DIAGNOSIS — F332 Major depressive disorder, recurrent severe without psychotic features: Secondary | ICD-10-CM

## 2016-05-22 DIAGNOSIS — E113392 Type 2 diabetes mellitus with moderate nonproliferative diabetic retinopathy without macular edema, left eye: Secondary | ICD-10-CM

## 2016-05-22 DIAGNOSIS — Z794 Long term (current) use of insulin: Secondary | ICD-10-CM

## 2016-05-22 DIAGNOSIS — E113291 Type 2 diabetes mellitus with mild nonproliferative diabetic retinopathy without macular edema, right eye: Secondary | ICD-10-CM | POA: Diagnosis not present

## 2016-05-22 DIAGNOSIS — E669 Obesity, unspecified: Secondary | ICD-10-CM

## 2016-05-22 DIAGNOSIS — E113393 Type 2 diabetes mellitus with moderate nonproliferative diabetic retinopathy without macular edema, bilateral: Secondary | ICD-10-CM

## 2016-05-22 DIAGNOSIS — F39 Unspecified mood [affective] disorder: Secondary | ICD-10-CM

## 2016-05-22 DIAGNOSIS — E1169 Type 2 diabetes mellitus with other specified complication: Secondary | ICD-10-CM

## 2016-05-22 LAB — HM DIABETES EYE EXAM

## 2016-05-22 MED ORDER — TRAZODONE HCL 50 MG PO TABS: ORAL_TABLET | ORAL | 1 refills | Status: DC

## 2016-05-22 MED ORDER — METFORMIN HCL 1000 MG PO TABS: 1000.0000 mg | ORAL_TABLET | Freq: Every day | ORAL | 11 refills | Status: DC

## 2016-05-22 MED ORDER — INSULIN GLARGINE 100 UNIT/ML SOLOSTAR PEN: 15.0000 [IU] | PEN_INJECTOR | Freq: Every day | SUBCUTANEOUS | 5 refills | Status: DC

## 2016-05-22 MED ORDER — TRAZODONE HCL 50 MG PO TABS: 50.0000 mg | ORAL_TABLET | Freq: Three times a day (TID) | ORAL | 1 refills | Status: DC

## 2016-05-22 NOTE — Progress Notes (Signed)
   CC: rash  HPI:  Kathy Frank is a 40 y.o. female who presents today for rash. Please see assessment & plan for status of chronic medical problems.  Past Medical History:  Diagnosis Date  . Cyclical vomiting    THC induced???  . Depression   . DKA (diabetic ketoacidoses) ([place]) 12/2014  . Erosive esophagitis   . Gastroparesis    ? diabetic  . Human immunodeficiency virus (HIV) disease ([place])   . Hypertension   . Normocytic anemia   . Type II diabetes mellitus ([place])    diagnosed around 1994    Review of Systems:  Please see each problem below for a pertinent review of systems.  Physical Exam:  Vitals:   05/22/16 1358  BP: 134/79  Pulse: 83  Temp: 98 F (36.7 C)  TempSrc: Oral  SpO2: 100%  Weight: 174 lb 6.4 oz (79.1 kg)  Height: 5\' 5"  (1.651 m)   Physical Exam  Constitutional: She is oriented to person, place, and time. No distress.  HENT:  Head: Normocephalic and atraumatic.  Eyes: Conjunctivae are normal. No scleral icterus.  Neurological: She is alert and oriented to person, place, and time.  Skin: She is not diaphoretic.  Papulovesicular, hyperpigmented lesions stable from prior with new lesions on the left calf muscle and the skin overlying the Achilles tendon.   Psychiatric:  Intermittently tearful      Assessment & Plan:   See Encounters Tab for problem based charting.  Patient discussed with Dr. Angelia Mould

## 2016-05-22 NOTE — Assessment & Plan Note (Addendum)
Assessment She is worried about her rash as it is intensely pruritic and is now involving other aspects of her left lower extremity. Appearance is not changed. She has not felt relief with her daughter's triamcinolone cream.  Review of the records shows that she has follow-up with dermatology. Speaking with our nursing staff, they are waiting to schedule an appointment with her.  In the absence of an etiology and lack of response to empiric steroid cream, I'm hesitant to prescribe any more empiric treatment without a definitive diagnosis and therefore would appreciate dermatology input.  Plan -Encouraged her to follow-up with dermatologist to better diagnose her condition prior to any more empiric treatments

## 2016-05-22 NOTE — Assessment & Plan Note (Signed)
Assessment A1c 7.2 on 04/23/16. Her medications include metformin 500 mg twice daily and Lantus 15 units at bedtime. She is requesting refills of her medications. She did not bring her glucometer with her today.  Plan -Refilled metformin 1000 mg to be taken once at breakfast -Refilled Lantus 15 units at bedtime -Discuss diabetes at next visit when we can recheck A1c

## 2016-05-22 NOTE — Assessment & Plan Note (Signed)
Assessment PHQ 9 is 23 today consistent with severe depression. She has had these symptoms before in the past, and with her new diagnosis, is been particularly difficult over the last couple weeks. She has not disclosed her diagnosis to her family members. In the past, she was on bupropion and trazodone, neither which she felt helped after numbers of years of being on these medications, so she took herself off of them.  In terms of suicidal ideation, she does acknowledge having these thoughts though would not act upon them at of interest for her kids. She smiles and also acknowledging that she has contemplated various modalities of acting on these thoughts but cannot bring herself to doing it.  Given that difficulty sleeping was a significant component of her mood disorder, I'm inclined to start with trazodone at depression dosing.  Plan -Start trazodone 50 mg 3 times daily to be up titrated to 100 mg at first dose and 50 mg twice daily in 4 days. -Follow-up next week by phone to assess how her symptoms have responded and to consider further up titration

## 2016-05-22 NOTE — Assessment & Plan Note (Signed)
Assessment She is overdue for retinal photography and is agreeable to having it done today.  Plan -Order retinal photography

## 2016-05-22 NOTE — Patient Instructions (Addendum)
For your mood, please try taking trazodone 1 tablet three times daily.   On Monday, take 2 tablets in the morning and 1 tablet the rest of the day.   I will call you Wednesday afternoon to see how you are doing.  Let's see each other back next month, and please bring your glucometer.

## 2016-05-26 NOTE — Progress Notes (Signed)
Internal Medicine Clinic Attending  Case discussed with Dr. Patel,Rushil at the time of the visit.  We reviewed the resident's history and exam and pertinent patient test results.  I agree with the assessment, diagnosis, and plan of care documented in the resident's note.  

## 2016-05-27 ENCOUNTER — Encounter: Payer: Self-pay | Admitting: Infectious Diseases

## 2016-05-27 ENCOUNTER — Ambulatory Visit (INDEPENDENT_AMBULATORY_CARE_PROVIDER_SITE_OTHER): Payer: Medicaid Other | Admitting: Infectious Diseases

## 2016-05-27 VITALS — BP 167/99 | HR 101 | Temp 98.4°F | Ht 65.0 in | Wt 171.0 lb

## 2016-05-27 DIAGNOSIS — F332 Major depressive disorder, recurrent severe without psychotic features: Secondary | ICD-10-CM | POA: Diagnosis not present

## 2016-05-27 DIAGNOSIS — E669 Obesity, unspecified: Secondary | ICD-10-CM

## 2016-05-27 DIAGNOSIS — B20 Human immunodeficiency virus [HIV] disease: Secondary | ICD-10-CM

## 2016-05-27 DIAGNOSIS — F17200 Nicotine dependence, unspecified, uncomplicated: Secondary | ICD-10-CM | POA: Diagnosis not present

## 2016-05-27 DIAGNOSIS — Z23 Encounter for immunization: Secondary | ICD-10-CM | POA: Diagnosis not present

## 2016-05-27 DIAGNOSIS — R21 Rash and other nonspecific skin eruption: Secondary | ICD-10-CM

## 2016-05-27 DIAGNOSIS — E1169 Type 2 diabetes mellitus with other specified complication: Secondary | ICD-10-CM | POA: Diagnosis not present

## 2016-05-27 LAB — COMPREHENSIVE METABOLIC PANEL
ALT: 9 U/L (ref 6–29)
AST: 14 U/L (ref 10–30)
Albumin: 3.5 g/dL — ABNORMAL LOW (ref 3.6–5.1)
Alkaline Phosphatase: 80 U/L (ref 33–115)
BUN: 14 mg/dL (ref 7–25)
CO2: 15 mmol/L — ABNORMAL LOW (ref 20–31)
Calcium: 9.4 mg/dL (ref 8.6–10.2)
Chloride: 113 mmol/L — ABNORMAL HIGH (ref 98–110)
Creat: 1.68 mg/dL — ABNORMAL HIGH (ref 0.50–1.10)
Glucose, Bld: 236 mg/dL — ABNORMAL HIGH (ref 65–99)
Potassium: 4 mmol/L (ref 3.5–5.3)
Sodium: 137 mmol/L (ref 135–146)
Total Bilirubin: 0.3 mg/dL (ref 0.2–1.2)
Total Protein: 7.8 g/dL (ref 6.1–8.1)

## 2016-05-27 LAB — CBC
HCT: 35.1 % (ref 35.0–45.0)
Hemoglobin: 11.9 g/dL (ref 11.7–15.5)
MCH: 29.5 pg (ref 27.0–33.0)
MCHC: 33.9 g/dL (ref 32.0–36.0)
MCV: 87.1 fL (ref 80.0–100.0)
MPV: 9.9 fL (ref 7.5–12.5)
Platelets: 320 10*3/uL (ref 140–400)
RBC: 4.03 MIL/uL (ref 3.80–5.10)
RDW: 15.5 % — ABNORMAL HIGH (ref 11.0–15.0)
WBC: 4.1 10*3/uL (ref 3.8–10.8)

## 2016-05-27 NOTE — Assessment & Plan Note (Signed)
Will recheck her labs today Starts Hep B series today.  Offered/refused condoms.  Will see her back in 6 weeks (mostly for her mental health and BP).  On waiting list for dental.

## 2016-05-27 NOTE — Progress Notes (Signed)
   Subjective:    Patient ID: Kathy Frank, female    DOB: 05-24-1976, 40 y.o.   MRN: [id number]  HPI 40 yo F newly dx with HIV this year. Was started on genvoya 04-2016.  Denies any problems with genvoya. Has had occas headaches.  Emotionally she is very stressed. Has not told her family her dx. Becomes tearful in office. Feels isolated. Was started on trazodone last week, has made her drowsy.   On waiting list for derm On list for dentist FSG have been high, has had readings over 300.   CD4 T Cell Abs (/uL)  Date Value  04/23/2016 220 (L)   Started Hep B today  Review of Systems  Constitutional: Positive for appetite change. Negative for unexpected weight change.  Gastrointestinal: Negative for diarrhea and nausea.  Genitourinary: Negative for difficulty urinating.  Skin: Positive for rash.  Psychiatric/Behavioral: Positive for dysphoric mood.       Objective:   Physical Exam  Constitutional: She appears well-developed and well-nourished.  HENT:  Mouth/Throat: No oropharyngeal exudate.  Eyes: EOM are normal. Pupils are equal, round, and reactive to light.  Neck: Neck supple.  Cardiovascular: Normal rate, regular rhythm and normal heart sounds.   Pulmonary/Chest: Effort normal and breath sounds normal.  Abdominal: Soft. Bowel sounds are normal. There is no tenderness. There is no rebound.  Musculoskeletal: She exhibits no edema.  Lymphadenopathy:    She has no cervical adenopathy.       Assessment & Plan:

## 2016-05-27 NOTE — Assessment & Plan Note (Signed)
Has quit smoking.

## 2016-05-27 NOTE — Assessment & Plan Note (Signed)
I offered to get her in with counselor but she defers.  I offered to get her a case manager, she defers.

## 2016-05-27 NOTE — Assessment & Plan Note (Signed)
Will f/u with IM next month. Had visit last week.

## 2016-05-27 NOTE — Assessment & Plan Note (Signed)
Waiting on derm eval.

## 2016-05-28 LAB — T-HELPER CELL (CD4) - (RCID CLINIC ONLY)
CD4 % Helper T Cell: 19 % — ABNORMAL LOW (ref 33–55)
CD4 T Cell Abs: 280 /uL — ABNORMAL LOW (ref 400–2700)

## 2016-05-29 ENCOUNTER — Telehealth: Payer: Self-pay | Admitting: Internal Medicine

## 2016-05-29 ENCOUNTER — Encounter: Payer: Self-pay | Admitting: Internal Medicine

## 2016-05-29 ENCOUNTER — Ambulatory Visit (INDEPENDENT_AMBULATORY_CARE_PROVIDER_SITE_OTHER): Payer: Medicaid Other | Admitting: Internal Medicine

## 2016-05-29 VITALS — BP 129/70 | HR 99 | Temp 98.5°F | Ht 64.0 in | Wt 174.0 lb

## 2016-05-29 DIAGNOSIS — E1165 Type 2 diabetes mellitus with hyperglycemia: Secondary | ICD-10-CM

## 2016-05-29 DIAGNOSIS — Z794 Long term (current) use of insulin: Secondary | ICD-10-CM

## 2016-05-29 DIAGNOSIS — E669 Obesity, unspecified: Principal | ICD-10-CM

## 2016-05-29 DIAGNOSIS — E1169 Type 2 diabetes mellitus with other specified complication: Secondary | ICD-10-CM

## 2016-05-29 DIAGNOSIS — E1122 Type 2 diabetes mellitus with diabetic chronic kidney disease: Secondary | ICD-10-CM

## 2016-05-29 DIAGNOSIS — N183 Chronic kidney disease, stage 3 unspecified: Secondary | ICD-10-CM

## 2016-05-29 DIAGNOSIS — Z9114 Patient's other noncompliance with medication regimen: Secondary | ICD-10-CM

## 2016-05-29 LAB — BASIC METABOLIC PANEL
Anion gap: 7 (ref 5–15)
BUN: 20 mg/dL (ref 6–20)
CO2: 16 mmol/L — ABNORMAL LOW (ref 22–32)
Calcium: 8.4 mg/dL — ABNORMAL LOW (ref 8.9–10.3)
Chloride: 114 mmol/L — ABNORMAL HIGH (ref 101–111)
Creatinine, Ser: 1.99 mg/dL — ABNORMAL HIGH (ref 0.44–1.00)
GFR calc Af Amer: 35 mL/min — ABNORMAL LOW (ref >60)
GFR calc non Af Amer: 30 mL/min — ABNORMAL LOW (ref >60)
Glucose, Bld: 166 mg/dL — ABNORMAL HIGH (ref 65–99)
Potassium: 4 mmol/L (ref 3.5–5.1)
Sodium: 137 mmol/L (ref 135–145)

## 2016-05-29 LAB — GLUCOSE, CAPILLARY: Glucose-Capillary: 170 mg/dL — ABNORMAL HIGH (ref 65–99)

## 2016-05-29 LAB — HIV-1 RNA QUANT-NO REFLEX-BLD
HIV 1 RNA Quant: 25 copies/mL — ABNORMAL HIGH (ref <20)
HIV-1 RNA Quant, Log: 1.4 Log copies/mL — ABNORMAL HIGH (ref <1.30)

## 2016-05-29 NOTE — Assessment & Plan Note (Addendum)
History of present illness Patient is being called into the clinic by her PCP due to concern for possible DKA. She recently visited infectious disease and labs ordered on October 18 showed a low bicarbonate of 15 with a normal anion gap of 9. CBG was 236 at that time. Current medication regimen includes metformin 1000 mg once daily and Lantus 15 units at bedtime. Patient reports taking medications differently - she is taking metformin 1000 mg twice daily and Lantus 15 units in the morning. States she does not take her medications regularly. Reports checking her blood glucose twice daily; average in the 220s to 230s, highest 301, lowest 98. Denies having symptoms of hypoglycemia. She did not bring her meter to this visit. She denies any recent illness or diarrhea. Denies having any nausea, vomiting, abdominal pain, weakness/fatigue, or lightheadedness/dizziness. Denies having any polyuria or polydipsia.  Assessment Even though bicarbonate was low on recent labs, normal anion gap metabolic acidosis is less likely as patient is not having any diarrhea and CBG was less than 250 at that time. In addition, she is not having any symptoms of DKA.   Plan -Check BMP and CBG -Advised patient to take metformin 1000 mg only once daily -Continue Lantus as above -Educated patient about healthy eating and exercise. Emphasized the importance of weight loss.  -Return to clinic in 1 month with meter  Addendum 05/29/2016 at 3:31 pm: Bicarbonate continues to be low (16) on today's labs. Anion gap (7) is normal. CBG 170. 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 I spoke to the patient over the phone and discussed the lab findings. Explained to her that additional tests will be needed. Patient stated she will come into the clinic on Monday (06/01/2016) to get her blood work  done. -Check urine pH -Check urine anion gap -Check urine calcium/ creatinine ratio  Addendum 06/19/2016: Labs showing urine pH 5.8, urine anion gap 29.2, and urine calcium/ creatinine ratio 0.0039. I discussed patient's case with nephrology (Dr. Marval Regal). 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 In addition, Genvoya (which contains tenofovir) was started in 04/2016, however, review of chart shows patient has borderline low bicarb of 19 in 10/2014. She has had worsening renal function since 2016. Based on most recent GFR of 35 on 05/29/2016, she is now stage III CKD. As such, considering all factors, her normal anion gap metabolic acidosis is likely secondary to CKD. -Start NaBicarb 650 mg bid -Nephrology referral -Spoke to the patient over the phone and she agrees with the treatment plan.  -Consider checking serum phosphorous at next visit

## 2016-05-29 NOTE — Telephone Encounter (Signed)
Her most recent bloodwork at [facility] was concerning for low bicarb and elevated glucose. I saw the note late yesterday and was unable to call her.   Can we call and just assess if she is taking her insulin and metformin? If she is not feeling well as evident by nausea, vomiting, poor appetite, I'd be concerned about ketoacidosis and would recommend she be assessed in clinic today.

## 2016-05-29 NOTE — Patient Instructions (Addendum)
Ms. Markoff it was nice meeting you today.  -Take Metformin 1000 mg only once a day  -Continue suing Lantus as before  -Return for a follow-up visit in 1 month with your meter.

## 2016-05-29 NOTE — Progress Notes (Signed)
   CC: Patient has been called into the clinic by her PCP due to concern for possible DKA.  HPI:  Kathy Frank is a 40 y.o. female with a past medical history of type 2 diabetes and conditions listed below been called into the clinic by her PCP due to concern for possible DKA. Please see problem based charting for the status of the patient's current and chronic medical conditions.   Past Medical History:  Diagnosis Date  . Cyclical vomiting    THC induced???  . Depression   . DKA (diabetic ketoacidoses) ([place]) 12/2014  . Erosive esophagitis   . Gastroparesis    ? diabetic  . Human immunodeficiency virus (HIV) disease   . Hypertension   . Normocytic anemia   . Type II diabetes mellitus ([place]) 1994   diagnosed around 1994    Review of Systems:  Pertinent positives mentioned in HPI. Remainder of all ROS negative.   Physical Exam:  Vitals:   05/29/16 1356  BP: 129/70  Pulse: 99  Temp: 98.5 F (36.9 C)  TempSrc: Oral  SpO2: 100%  Weight: 174 lb (78.9 kg)  Height: 5\' 4"  (1.626 m)   Physical Exam  Constitutional: She is oriented to person, place, and time. She appears well-developed and well-nourished. No distress.  HENT:  Head: Normocephalic and atraumatic.  Mouth/Throat: Oropharynx is clear and moist.  Eyes: EOM are normal. Right eye exhibits no discharge. Left eye exhibits no discharge.  Neck: Neck supple. No tracheal deviation present.  Cardiovascular: Regular rhythm and intact distal pulses.   Tachycardic  Pulmonary/Chest: Effort normal and breath sounds normal. No respiratory distress.  Abdominal: Soft. Bowel sounds are normal. She exhibits no distension. There is no tenderness. There is no guarding.  Musculoskeletal: Normal range of motion. She exhibits no edema.  Neurological: She is alert and oriented to person, place, and time.  Skin: Skin is warm and dry.    Assessment & Plan:   See Encounters Tab for problem based charting.  Patient discussed with Dr.  Angelia Mould

## 2016-05-29 NOTE — Addendum Note (Signed)
Addended by: Shela Leff on: 05/29/2016 03:53 PM   Modules accepted: Orders

## 2016-05-29 NOTE — Telephone Encounter (Signed)
Called pt, she states she is taking her meds but she is nauseated and doesn't feel good, informed her that dr patel would like for her to be seen as a precaution, reluctantly she agreed, ACC 1315 today

## 2016-06-01 ENCOUNTER — Other Ambulatory Visit: Payer: Medicaid Other

## 2016-06-01 DIAGNOSIS — E669 Obesity, unspecified: Principal | ICD-10-CM

## 2016-06-01 DIAGNOSIS — E1169 Type 2 diabetes mellitus with other specified complication: Secondary | ICD-10-CM

## 2016-06-01 NOTE — Progress Notes (Signed)
Internal Medicine Clinic Attending  Case discussed with Dr. Marlowe Sax at the time of the visit.  We reviewed the resident's history and exam and pertinent patient test results.  I agree with the assessment, diagnosis, and plan of care documented in the resident's note. Patient has a non-AG metabolic acidosis, she denies diarrhea.  We will therefore evaluate for RTA (although potassium is normal), one possibility is RTA due to Tenofovir as she has recently been diagnosed with HIV

## 2016-06-02 LAB — MISC LABCORP TEST (SEND OUT): Labcorp test code: 13037

## 2016-06-02 LAB — CHLORIDE, URINE, RANDOM: Chloride, Ur: 49 mmol/L

## 2016-06-02 LAB — SODIUM, URINE, RANDOM: Sodium, Ur: 43 mmol/L

## 2016-06-02 LAB — CALCIUM, URINE, RANDOM: Calcium, Urine: <0.8 mg/dL

## 2016-06-02 LAB — CREATININE, URINE, RANDOM: Creatinine, Urine: 200.8 mg/dL

## 2016-06-02 LAB — POTASSIUM, URINE, RANDOM: Potassium Urine: 35.2 mmol/L

## 2016-06-03 ENCOUNTER — Encounter: Payer: Self-pay | Admitting: Dietician

## 2016-06-05 ENCOUNTER — Ambulatory Visit: Payer: Medicaid Other

## 2016-06-19 ENCOUNTER — Telehealth: Payer: Self-pay | Admitting: *Deleted

## 2016-06-19 ENCOUNTER — Other Ambulatory Visit: Payer: Self-pay

## 2016-06-19 DIAGNOSIS — N185 Chronic kidney disease, stage 5: Secondary | ICD-10-CM

## 2016-06-19 DIAGNOSIS — N183 Chronic kidney disease, stage 3 unspecified: Secondary | ICD-10-CM | POA: Insufficient documentation

## 2016-06-19 HISTORY — DX: Chronic kidney disease, stage 5: N18.5

## 2016-06-19 MED ORDER — SODIUM BICARBONATE 650 MG PO TABS: 650.0000 mg | ORAL_TABLET | Freq: Two times a day (BID) | ORAL | 2 refills | Status: DC

## 2016-06-19 NOTE — Assessment & Plan Note (Addendum)
Labs done 05/29/2016 showing Cr 1.9 and GFR 35. Patient also has normal anion gap metabolic acidosis. Please read detailed note from 05/29/2016 under type 2 DM.

## 2016-06-19 NOTE — Addendum Note (Signed)
Addended by: Shela Leff on: 06/19/2016 01:44 PM   Modules accepted: Orders

## 2016-06-19 NOTE — Telephone Encounter (Signed)
RN reviewing old referrals and noted that the patient continues to be in care but declines Case management. RN noted that she appears to be struggling with depression so RN sent some words of encouragement to Ms. Killilea in hopes she will continue to take her medication and stay in care with Dr Johnnye Sima despite not having case management.   Message stated "Hey Ms. Ciolek. My name is Meet Weathington(nurse) and we meet at your first doctors appt in September. I just wanted you to know that I am so proud of you! I saw your labs and you are doing a great job! Please stay encouraged"  RN received a reply that stated "thank you"

## 2016-06-19 NOTE — Telephone Encounter (Signed)
Requesting test strips to be filled @ [facility] on [address].

## 2016-06-19 NOTE — Telephone Encounter (Signed)
Called pt - telephone # "not in service" to find out the type of meter she uses in order to put in order for test strips. Also called the pharmacy to see if they knew; left message.

## 2016-06-22 MED ORDER — GLUCOSE BLOOD VI STRP: ORAL_STRIP | 11 refills | Status: DC

## 2016-06-22 NOTE — Telephone Encounter (Signed)
states she has Accu-chek guide meter.

## 2016-06-22 NOTE — Telephone Encounter (Signed)
As this patient was seen by me and deemed necessary for their treatment, I will refill this prescription for testing supplies.

## 2016-07-01 ENCOUNTER — Other Ambulatory Visit: Payer: Self-pay | Admitting: Nephrology

## 2016-07-01 DIAGNOSIS — N183 Chronic kidney disease, stage 3 unspecified: Secondary | ICD-10-CM

## 2016-07-08 ENCOUNTER — Ambulatory Visit: Payer: Medicaid Other | Admitting: *Deleted

## 2016-07-08 ENCOUNTER — Ambulatory Visit (INDEPENDENT_AMBULATORY_CARE_PROVIDER_SITE_OTHER): Payer: Medicaid Other | Admitting: Infectious Diseases

## 2016-07-08 ENCOUNTER — Encounter: Payer: Self-pay | Admitting: Infectious Diseases

## 2016-07-08 VITALS — BP 173/99 | HR 90 | Temp 97.8°F | Ht 65.0 in | Wt 185.2 lb

## 2016-07-08 DIAGNOSIS — Z79899 Other long term (current) drug therapy: Secondary | ICD-10-CM

## 2016-07-08 DIAGNOSIS — Z23 Encounter for immunization: Secondary | ICD-10-CM | POA: Diagnosis not present

## 2016-07-08 DIAGNOSIS — B20 Human immunodeficiency virus [HIV] disease: Secondary | ICD-10-CM

## 2016-07-08 DIAGNOSIS — N183 Chronic kidney disease, stage 3 unspecified: Secondary | ICD-10-CM

## 2016-07-08 DIAGNOSIS — F332 Major depressive disorder, recurrent severe without psychotic features: Secondary | ICD-10-CM

## 2016-07-08 DIAGNOSIS — L97509 Non-pressure chronic ulcer of other part of unspecified foot with unspecified severity: Secondary | ICD-10-CM | POA: Insufficient documentation

## 2016-07-08 DIAGNOSIS — F329 Major depressive disorder, single episode, unspecified: Secondary | ICD-10-CM

## 2016-07-08 DIAGNOSIS — Z113 Encounter for screening for infections with a predominantly sexual mode of transmission: Secondary | ICD-10-CM

## 2016-07-08 DIAGNOSIS — F32A Depression, unspecified: Secondary | ICD-10-CM

## 2016-07-08 DIAGNOSIS — L97521 Non-pressure chronic ulcer of other part of left foot limited to breakdown of skin: Secondary | ICD-10-CM

## 2016-07-08 DIAGNOSIS — E08621 Diabetes mellitus due to underlying condition with foot ulcer: Secondary | ICD-10-CM

## 2016-07-08 MED ORDER — DOLUTEGRAVIR SODIUM 50 MG PO TABS: 50.0000 mg | ORAL_TABLET | Freq: Every day | ORAL | 3 refills | Status: DC

## 2016-07-08 MED ORDER — EMTRICITABINE-TENOFOVIR AF 200-25 MG PO TABS: 1.0000 | ORAL_TABLET | Freq: Every day | ORAL | 3 refills | Status: DC

## 2016-07-08 NOTE — Assessment & Plan Note (Signed)
Will change her ART to dtgv/descovy.

## 2016-07-08 NOTE — Progress Notes (Signed)
Counselor met with Kathy Frank in the exam room today for a warm hand off as patient was visibly upset about her diagnosis. Patient was oriented times four with good affect and dress. Patient was alert but not very talkative.  Patient had tears in her eyes and did not share much other than she was not handling being diagnosed.  Patient said that she has no one to confide in and is not sure who to talk to about her status and who not tool.  Counselor provided support and encouragement for patient while she shared.  Counselor explained to patient about the counseling services that are available to her along with her treatment. Patient said that she would like to meet with a counselor in order to process what all is happening to her physically.  Counselor recommended that patient make an appointment when she checks out today.  Rolena Infante, MA, LPC [facility]

## 2016-07-08 NOTE — Assessment & Plan Note (Signed)
Will have her see Leveda Anna today.

## 2016-07-08 NOTE — Addendum Note (Signed)
Addended by: Roma Kayser on: 07/08/2016 11:29 AM   Modules accepted: Orders

## 2016-07-08 NOTE — Assessment & Plan Note (Signed)
Will change her to descovy/tivicay to decrease Cobi effects.  Needs pap Offered/refused condoms.  [place] today Will have her see pharm in 1 month rtc in 3 months.  Has gotten flu shot.

## 2016-07-08 NOTE — Progress Notes (Signed)
   Subjective:    Patient ID: Kathy Frank, female    DOB: 22-Jul-1976, 40 y.o.   MRN: [id number]  HPI 40 yo F with HIV+ since 2017, started on genvoya.  She is also newly dx with DM2.  Today she complains of headaches. Was taking BC but renal asked her to stop. Tylenol has not been helpful. Took her anti-htn rx this am.  States she is crying all the time. Everything makes her upset. "Just my health problems".  States she sees IM q58m, not sure when next appt is.   HIV 1 RNA Quant (copies/mL)  Date Value  05/27/2016 25 (H)   CD4 T Cell Abs (/uL)  Date Value  05/27/2016 280 (L)  04/23/2016 220 (L)   Cr in Feb 2-05.  Last was 1.99 (05-29-16)  Has derm appt end of dec.  Has diabetic foot ulcer, attributes to her shoes.  Seeing dental today  No PAP this year  Review of Systems  Constitutional: Negative for appetite change and unexpected weight change.  Gastrointestinal: Negative for constipation and diarrhea.  Genitourinary: Negative for difficulty urinating, dysuria and hematuria.       Objective:   Physical Exam  Constitutional: She appears well-developed and well-nourished.  HENT:  Mouth/Throat: No oropharyngeal exudate.  Eyes: EOM are normal. Pupils are equal, round, and reactive to light.  Neck: Neck supple.  Cardiovascular: Normal rate, regular rhythm and normal heart sounds.   Pulmonary/Chest: Effort normal and breath sounds normal.  Abdominal: Soft. Bowel sounds are normal. There is no tenderness. There is no rebound.  Musculoskeletal:       Feet:  Lymphadenopathy:    She has no cervical adenopathy.          Assessment & Plan:

## 2016-07-08 NOTE — Assessment & Plan Note (Signed)
Will have her seen by podiatry

## 2016-07-09 ENCOUNTER — Other Ambulatory Visit: Payer: Self-pay

## 2016-07-10 MED ORDER — LISINOPRIL 20 MG PO TABS: 20.0000 mg | ORAL_TABLET | Freq: Every day | ORAL | 3 refills | Status: DC

## 2016-07-10 NOTE — Telephone Encounter (Signed)
As this patient was seen by me and deemed necessary for their treatment, I will refill this prescription for lisinopril.

## 2016-07-15 ENCOUNTER — Encounter: Payer: Self-pay | Admitting: Podiatry

## 2016-07-15 ENCOUNTER — Ambulatory Visit (INDEPENDENT_AMBULATORY_CARE_PROVIDER_SITE_OTHER): Payer: Medicaid Other | Admitting: Podiatry

## 2016-07-15 VITALS — BP 181/105 | HR 89 | Resp 16 | Ht 65.0 in | Wt 180.0 lb

## 2016-07-15 DIAGNOSIS — L97529 Non-pressure chronic ulcer of other part of left foot with unspecified severity: Secondary | ICD-10-CM

## 2016-07-15 DIAGNOSIS — E11610 Type 2 diabetes mellitus with diabetic neuropathic arthropathy: Secondary | ICD-10-CM | POA: Diagnosis not present

## 2016-07-15 DIAGNOSIS — E11621 Type 2 diabetes mellitus with foot ulcer: Secondary | ICD-10-CM

## 2016-07-15 DIAGNOSIS — L309 Dermatitis, unspecified: Secondary | ICD-10-CM

## 2016-07-15 NOTE — Progress Notes (Signed)
   Subjective:    Patient ID: Kathy Frank, female    DOB: Jan 03, 1976, 40 y.o.   MRN: [id number]  HPI Chief Complaint  Patient presents with  . Wound Check    Left foot; great toe-medial side; pt stated, "When wears shoes, it leaks"; x2 weeks; Pt Diabetic Type 2; Sugar=73 this am; A1C=7.5      Review of Systems  All other systems reviewed and are negative.      Objective:   Physical Exam        Assessment & Plan:

## 2016-07-16 NOTE — Progress Notes (Signed)
Subjective:     Patient ID: Kathy Frank, female   DOB: 09-12-1975, 40 y.o.   MRN: [id number]  HPI patient presents stating she developed some discoloration of her left big toe with keratotic lesion and also some dermatological lesions on both feet that she's having check next week. She has diabetes but is under excellent control and states this is the first time she's had this on her foot   Review of Systems  All other systems reviewed and are negative.      Objective:   Physical Exam  Constitutional: She is oriented to person, place, and time.  Cardiovascular: Intact distal pulses.   Musculoskeletal: Normal range of motion.  Neurological: She is oriented to person, place, and time.  Skin: Skin is warm and dry.  Nursing note and vitals reviewed.  neurovascular status is intact with slight diminishment of sharp Dole vibratory but intact with patient noted to have lesions on top of the right foot and on the left hallux keratotic lesion medial side that has crusted tissue formation but no bone exposure or deep subcutaneous exposure. 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 Careful debridement done around the tissue today with no indications of bone infection or deep infection and I flushed the area and applied oh sore with sterile dressing. She is going to use open toed shoes and padding and she will see dermatologist next week for second opinion as it was already scheduled and she'll be seen back if any redness drainage were to occur and I did tell her there is a possibility at one point in future she may end up requiring amputation of the big toe due to the tissue formation but at this time it looks clean

## 2016-07-27 ENCOUNTER — Telehealth: Payer: Self-pay | Admitting: Internal Medicine

## 2016-07-27 NOTE — Telephone Encounter (Signed)
APT. REMINDER CALL, LMTCB °

## 2016-07-28 ENCOUNTER — Ambulatory Visit: Payer: Medicaid Other

## 2016-08-14 ENCOUNTER — Other Ambulatory Visit: Payer: Medicaid Other

## 2016-08-25 ENCOUNTER — Ambulatory Visit
Admission: RE | Admit: 2016-08-25 | Discharge: 2016-08-25 | Disposition: A | Discharge status: Home / Self Care | Payer: Medicaid Other | Source: Ambulatory Visit | Attending: Nephrology | Admitting: Nephrology

## 2016-08-25 DIAGNOSIS — N183 Chronic kidney disease, stage 3 unspecified: Secondary | ICD-10-CM

## 2016-09-09 ENCOUNTER — Other Ambulatory Visit: Payer: Self-pay | Admitting: Internal Medicine

## 2016-09-09 MED ORDER — ACCU-CHEK FASTCLIX LANCETS MISC: 12 refills | Status: DC

## 2016-09-09 MED ORDER — ACCU-CHEK GUIDE W/DEVICE KIT: 1.0000 | PACK | Freq: Three times a day (TID) | 1 refills | Status: DC

## 2016-09-09 NOTE — Telephone Encounter (Signed)
Thank you for routing the prescriptions to me. I will send them over.

## 2016-09-09 NOTE — Telephone Encounter (Signed)
Called [facility], she washed her meter and now it does not work. She purchased it herself so thinks her [place] [employer] will pay for a new one. She also needs lancets and wants both prescriptions to be sent to [facility] because she does not have transportation.  P; request prescriptions for meter and lancets be sent to [facility].

## 2016-09-09 NOTE — Telephone Encounter (Signed)
Butch Penny, could you please take a look at this and decide what might be the best meter for this pt, it doesn't look like she has coverage, I have called [facility]. To look at where she is at financially and offer services if possible

## 2016-09-09 NOTE — Telephone Encounter (Signed)
Pt states she needs a rx for a new meter, accucheck

## 2016-09-29 ENCOUNTER — Ambulatory Visit (INDEPENDENT_AMBULATORY_CARE_PROVIDER_SITE_OTHER): Payer: Medicaid Other | Admitting: Internal Medicine

## 2016-09-29 ENCOUNTER — Encounter (INDEPENDENT_AMBULATORY_CARE_PROVIDER_SITE_OTHER): Payer: Self-pay

## 2016-09-29 VITALS — BP 147/93 | HR 100 | Temp 98.1°F | Wt 172.4 lb

## 2016-09-29 DIAGNOSIS — E119 Type 2 diabetes mellitus without complications: Secondary | ICD-10-CM | POA: Diagnosis not present

## 2016-09-29 DIAGNOSIS — E1169 Type 2 diabetes mellitus with other specified complication: Secondary | ICD-10-CM

## 2016-09-29 DIAGNOSIS — F1290 Cannabis use, unspecified, uncomplicated: Secondary | ICD-10-CM | POA: Diagnosis not present

## 2016-09-29 DIAGNOSIS — R000 Tachycardia, unspecified: Secondary | ICD-10-CM | POA: Diagnosis not present

## 2016-09-29 DIAGNOSIS — Z794 Long term (current) use of insulin: Secondary | ICD-10-CM | POA: Diagnosis not present

## 2016-09-29 DIAGNOSIS — A084 Viral intestinal infection, unspecified: Secondary | ICD-10-CM | POA: Diagnosis not present

## 2016-09-29 DIAGNOSIS — E669 Obesity, unspecified: Principal | ICD-10-CM

## 2016-09-29 LAB — POCT GLYCOSYLATED HEMOGLOBIN (HGB A1C): Hemoglobin A1C: 7.5

## 2016-09-29 LAB — GLUCOSE, CAPILLARY: Glucose-Capillary: 274 mg/dL — ABNORMAL HIGH (ref 65–99)

## 2016-09-29 MED ORDER — PANTOPRAZOLE SODIUM 40 MG PO TBEC: 40.0000 mg | DELAYED_RELEASE_TABLET | Freq: Every day | ORAL | 1 refills | Status: DC

## 2016-09-29 MED ORDER — ONDANSETRON HCL 4 MG PO TABS: 4.0000 mg | ORAL_TABLET | Freq: Four times a day (QID) | ORAL | 0 refills | Status: DC | PRN

## 2016-09-29 NOTE — Assessment & Plan Note (Addendum)
Patient states that 4 days ago she developed onset of nausea with multiple episodes of non-bloody emesis. Her nausea and vomiting is also associated with fatigue, aches, heartburn. She also admits to chills. She denies fever, nasal congestion, diarrhea, dysuria, sore throat, headache, ear pain, chest pain, wheezing. She has not tried anything for her symptoms. She has been drinking Gatorade to stay hydrated. She denies any lightheadedness and has been able to keep food and liquids down, but will occasionally throw them up. She does admit to a sick exposure and her grandchild last week he was diagnosed with the flu. Patient has a history of nausea and vomiting that was previously attributed to diabetic gastroparesis from uncontrolled diabetes or due to marijuana use. She did well for years off of Reglan, PPI, H2 blocker. She states that this nausea and vomiting does not seem similar to her previous episodes . She does admit to occasional marijuana use. Patient is well-appearing on exam, normal skin turgor and moist mucous membranes, but is tachycardic. No abdominal pain on palpation.  Plan: -Zofran 4 mg every 6 hours when necessary nausea vomiting -Protonix 40 mg once a day -Hold metformin and lisinopril until patient is feeling back to normal and eating and drinking well -Basic metabolic panel to assess for acute on chronic kidney disease -Patient is to return or go to emergency department if she develops lightheadedness or dehydration

## 2016-09-29 NOTE — Progress Notes (Signed)
Internal Medicine Clinic Attending  Case discussed with Dr. Burns soon after the resident saw the patient.  We reviewed the resident's history and exam and pertinent patient test results.  I agree with the assessment, diagnosis, and plan of care documented in the resident's note. 

## 2016-09-29 NOTE — Assessment & Plan Note (Signed)
Lab Results  Component Value Date   HGBA1C 7.5 09/29/2016   HGBA1C 7.2 04/23/2016   HGBA1C 9.9 (H) 10/31/2014     Assessment: Diabetes control:  controlled Progress toward A1C goal:   about a goal Comments: Compliant with metformin and Lantus  Plan: Medications:  continue current medications Other plans: Follow up in 3 months for repeat A1c

## 2016-09-29 NOTE — Progress Notes (Signed)
    CC: Nausea and vomiting  HPI: Kathy Frank is a 41 y.o. female with PMHx of HIV, hypertension who presents to the clinic for nausea and vomiting.  Patient states that 4 days ago she developed onset of nausea with multiple episodes of non-bloody emesis. Her nausea and vomiting is also associated with fatigue, aches, heartburn. She also admits to chills. She denies fever, nasal congestion, diarrhea, dysuria, sore throat, headache, ear pain, chest pain, wheezing. She has not tried anything for her symptoms. She has been drinking Gatorade to stay hydrated. She denies any lightheadedness and has been able to keep food and liquids down, but will occasionally throw them up. She does admit to a sick exposure and her grandchild last week he was diagnosed with the flu. Patient has a history of nausea and vomiting that was previously attributed to diabetic gastroparesis from uncontrolled diabetes or due to marijuana use. She did well for years off of Reglan, PPI, H2 blocker. She states that this nausea and vomiting does not seem similar to her previous episodes . She does admit to occasional marijuana use.    Past Medical History:  Diagnosis Date  . Cyclical vomiting    THC induced???  . Depression   . DKA (diabetic ketoacidoses) ([place]) 12/2014  . Erosive esophagitis   . Gastroparesis    ? diabetic  . Human immunodeficiency virus (HIV) disease   . Hypertension   . Normocytic anemia   . Type II diabetes mellitus ([place]) 1994   diagnosed around 1994    Review of Systems: Please see pertinent ROS reviewed in HPI and problem based charting.   Physical Exam: Vitals:   09/29/16 1012  BP: (!) 147/93  Pulse: 100  Temp: 98.1 F (36.7 C)  TempSrc: Oral  SpO2: 100%  Weight: 172 lb 6.4 oz (78.2 kg)   General: Vital signs reviewed.  Patient is well-developed and well-nourished, in no acute distress and cooperative with exam.  Head: Normocephalic and atraumatic. Eyes: PERRLA, conjunctivae  normal, no scleral icterus.  Nose: Normal nasal turbinates Mouth: Moist mucous membranes Neck: Supple, trachea midline  Cardiovascular: Tachycardic, regular rhythm, S1 normal, S2 normal, no murmurs, gallops, or rubs. Pulmonary/Chest: Clear to auscultation bilaterally, no wheezes, rales, or rhonchi. Abdominal: Soft, non-tender, non-distended, BS +, no guarding present.  Extremities: No lower extremity edema bilaterally Skin: Warm, dry and intact. No rashes or erythema. normal skin turgor  Psychiatric: Normal mood and affect. speech and behavior is normal. Cognition and memory are normal.   Assessment & Plan:  See encounters tab for problem based medical decision making. Patient discussed with Dr. Evette Doffing

## 2016-09-29 NOTE — Patient Instructions (Signed)
For your nausea: Takes Zofran 4 mg every 6 hours as needed. Take pantoprazole 40 mg once a day. Please try to eat and drink to stay hydrated. If you're able to keep food and liquids down, or become lightheaded and dizzy, please proceed to the emergency department.  Until you are feeling better, do not take your lisinopril or your metformin. He can restart these when you're feeling better. He can continue taking her Lantus, but if you are noticing low blood sugars, please reduce your dose.

## 2016-09-30 ENCOUNTER — Encounter: Payer: Self-pay | Admitting: Internal Medicine

## 2016-09-30 ENCOUNTER — Observation Stay (HOSPITAL_COMMUNITY)
Admission: AD | Admit: 2016-09-30 | Discharge: 2016-10-01 | Disposition: A | Discharge status: Home / Self Care | Payer: Medicaid Other | Source: Ambulatory Visit | Attending: Internal Medicine | Admitting: Internal Medicine

## 2016-09-30 ENCOUNTER — Other Ambulatory Visit: Payer: Medicaid Other

## 2016-09-30 ENCOUNTER — Encounter (HOSPITAL_COMMUNITY): Payer: Self-pay | Admitting: General Practice

## 2016-09-30 ENCOUNTER — Ambulatory Visit (INDEPENDENT_AMBULATORY_CARE_PROVIDER_SITE_OTHER): Payer: Medicaid Other | Admitting: Internal Medicine

## 2016-09-30 VITALS — BP 134/95 | HR 103 | Temp 98.0°F | Wt 172.0 lb

## 2016-09-30 DIAGNOSIS — I951 Orthostatic hypotension: Secondary | ICD-10-CM | POA: Diagnosis present

## 2016-09-30 DIAGNOSIS — N179 Acute kidney failure, unspecified: Secondary | ICD-10-CM | POA: Diagnosis not present

## 2016-09-30 DIAGNOSIS — E1143 Type 2 diabetes mellitus with diabetic autonomic (poly)neuropathy: Secondary | ICD-10-CM | POA: Diagnosis not present

## 2016-09-30 DIAGNOSIS — E1122 Type 2 diabetes mellitus with diabetic chronic kidney disease: Secondary | ICD-10-CM | POA: Diagnosis not present

## 2016-09-30 DIAGNOSIS — E871 Hypo-osmolality and hyponatremia: Secondary | ICD-10-CM | POA: Diagnosis not present

## 2016-09-30 DIAGNOSIS — B20 Human immunodeficiency virus [HIV] disease: Secondary | ICD-10-CM | POA: Diagnosis present

## 2016-09-30 DIAGNOSIS — R112 Nausea with vomiting, unspecified: Principal | ICD-10-CM

## 2016-09-30 DIAGNOSIS — K3184 Gastroparesis: Secondary | ICD-10-CM | POA: Diagnosis present

## 2016-09-30 DIAGNOSIS — E669 Obesity, unspecified: Secondary | ICD-10-CM | POA: Diagnosis present

## 2016-09-30 DIAGNOSIS — N183 Chronic kidney disease, stage 3 (moderate): Secondary | ICD-10-CM | POA: Diagnosis not present

## 2016-09-30 DIAGNOSIS — Z833 Family history of diabetes mellitus: Secondary | ICD-10-CM

## 2016-09-30 DIAGNOSIS — E860 Dehydration: Secondary | ICD-10-CM | POA: Diagnosis not present

## 2016-09-30 DIAGNOSIS — Z20828 Contact with and (suspected) exposure to other viral communicable diseases: Secondary | ICD-10-CM

## 2016-09-30 DIAGNOSIS — F1210 Cannabis abuse, uncomplicated: Secondary | ICD-10-CM

## 2016-09-30 DIAGNOSIS — R000 Tachycardia, unspecified: Secondary | ICD-10-CM | POA: Diagnosis not present

## 2016-09-30 DIAGNOSIS — F1220 Cannabis dependence, uncomplicated: Secondary | ICD-10-CM

## 2016-09-30 DIAGNOSIS — I129 Hypertensive chronic kidney disease with stage 1 through stage 4 chronic kidney disease, or unspecified chronic kidney disease: Secondary | ICD-10-CM

## 2016-09-30 DIAGNOSIS — K219 Gastro-esophageal reflux disease without esophagitis: Secondary | ICD-10-CM | POA: Diagnosis present

## 2016-09-30 DIAGNOSIS — E1169 Type 2 diabetes mellitus with other specified complication: Secondary | ICD-10-CM | POA: Diagnosis present

## 2016-09-30 DIAGNOSIS — Z810 Family history of intellectual disabilities: Secondary | ICD-10-CM

## 2016-09-30 DIAGNOSIS — E119 Type 2 diabetes mellitus without complications: Secondary | ICD-10-CM | POA: Diagnosis present

## 2016-09-30 DIAGNOSIS — E876 Hypokalemia: Secondary | ICD-10-CM | POA: Diagnosis not present

## 2016-09-30 DIAGNOSIS — I10 Essential (primary) hypertension: Secondary | ICD-10-CM | POA: Diagnosis present

## 2016-09-30 DIAGNOSIS — E1165 Type 2 diabetes mellitus with hyperglycemia: Secondary | ICD-10-CM | POA: Diagnosis not present

## 2016-09-30 DIAGNOSIS — Z87891 Personal history of nicotine dependence: Secondary | ICD-10-CM | POA: Diagnosis not present

## 2016-09-30 DIAGNOSIS — Z21 Asymptomatic human immunodeficiency virus [HIV] infection status: Secondary | ICD-10-CM

## 2016-09-30 DIAGNOSIS — Z794 Long term (current) use of insulin: Secondary | ICD-10-CM | POA: Insufficient documentation

## 2016-09-30 DIAGNOSIS — R1013 Epigastric pain: Secondary | ICD-10-CM | POA: Diagnosis not present

## 2016-09-30 DIAGNOSIS — I700 Atherosclerosis of aorta: Secondary | ICD-10-CM | POA: Insufficient documentation

## 2016-09-30 HISTORY — DX: Gastro-esophageal reflux disease without esophagitis: K21.9

## 2016-09-30 LAB — HEPATIC FUNCTION PANEL
ALT: 16 U/L (ref 14–54)
AST: 22 U/L (ref 15–41)
Albumin: 3 g/dL — ABNORMAL LOW (ref 3.5–5.0)
Alkaline Phosphatase: 75 U/L (ref 38–126)
Bilirubin, Direct: 0.1 mg/dL (ref 0.1–0.5)
Indirect Bilirubin: 0.2 mg/dL — ABNORMAL LOW (ref 0.3–0.9)
Total Bilirubin: 0.3 mg/dL (ref 0.3–1.2)
Total Protein: 7.3 g/dL (ref 6.5–8.1)

## 2016-09-30 LAB — GLUCOSE, CAPILLARY
Glucose-Capillary: 312 mg/dL — ABNORMAL HIGH (ref 65–99)
Glucose-Capillary: 320 mg/dL — ABNORMAL HIGH (ref 65–99)
Glucose-Capillary: 74 mg/dL (ref 65–99)
Glucose-Capillary: 136 mg/dL — ABNORMAL HIGH (ref 65–99)

## 2016-09-30 LAB — CBC WITH DIFFERENTIAL/PLATELET
Basophils Absolute: 0.1 10*3/uL (ref 0.0–0.1)
Basophils Relative: 1 %
Eosinophils Absolute: 0 10*3/uL (ref 0.0–0.7)
Eosinophils Relative: 1 %
HCT: 36.5 % (ref 36.0–46.0)
Hemoglobin: 12.6 g/dL (ref 12.0–15.0)
Lymphocytes Relative: 31 %
Lymphs Abs: 1.2 10*3/uL (ref 0.7–4.0)
MCH: 30.3 pg (ref 26.0–34.0)
MCHC: 34.5 g/dL (ref 30.0–36.0)
MCV: 87.7 fL (ref 78.0–100.0)
Monocytes Absolute: 0.4 10*3/uL (ref 0.1–1.0)
Monocytes Relative: 9 %
Neutro Abs: 2.2 10*3/uL (ref 1.7–7.7)
Neutrophils Relative %: 58 %
Platelets: 210 10*3/uL (ref 150–400)
RBC: 4.16 MIL/uL (ref 3.87–5.11)
RDW: 13.5 % (ref 11.5–15.5)
WBC: 3.8 10*3/uL — ABNORMAL LOW (ref 4.0–10.5)

## 2016-09-30 LAB — BASIC METABOLIC PANEL
Anion gap: 15 (ref 5–15)
BUN: 36 mg/dL — ABNORMAL HIGH (ref 6–20)
CO2: 23 mmol/L (ref 22–32)
Calcium: 8.9 mg/dL (ref 8.9–10.3)
Chloride: 94 mmol/L — ABNORMAL LOW (ref 101–111)
Creatinine, Ser: 3.15 mg/dL — ABNORMAL HIGH (ref 0.44–1.00)
GFR calc Af Amer: 20 mL/min — ABNORMAL LOW (ref >60)
GFR calc non Af Amer: 17 mL/min — ABNORMAL LOW (ref >60)
Glucose, Bld: 313 mg/dL — ABNORMAL HIGH (ref 65–99)
Potassium: 3 mmol/L — ABNORMAL LOW (ref 3.5–5.1)
Sodium: 132 mmol/L — ABNORMAL LOW (ref 135–145)

## 2016-09-30 LAB — INFLUENZA PANEL BY PCR (TYPE A & B)
Influenza A By PCR: NEGATIVE
Influenza B By PCR: NEGATIVE

## 2016-09-30 LAB — POCT URINE PREGNANCY: Preg Test, Ur: NEGATIVE

## 2016-09-30 LAB — MAGNESIUM: Magnesium: 1.9 mg/dL (ref 1.7–2.4)

## 2016-09-30 MED ORDER — MORPHINE SULFATE (PF) 2 MG/ML IV SOLN
2.0000 mg | Freq: Four times a day (QID) | INTRAVENOUS | Status: DC | PRN
  Administered 2016-09-30 (×3): 2 mg via INTRAVENOUS
  Filled 2016-09-30 (×4): qty 1

## 2016-09-30 MED ORDER — PROMETHAZINE HCL 25 MG/ML IJ SOLN: 12.5000 mg | Freq: Once | INTRAMUSCULAR | Status: DC

## 2016-09-30 MED ORDER — GI COCKTAIL ~~LOC~~
30.0000 mL | Freq: Once | ORAL | Status: AC
  Administered 2016-09-30: 30 mL via ORAL
  Filled 2016-09-30: qty 30

## 2016-09-30 MED ORDER — INSULIN ASPART 100 UNIT/ML ~~LOC~~ SOLN: 0.0000 [IU] | Freq: Every day | SUBCUTANEOUS | Status: DC

## 2016-09-30 MED ORDER — EMTRICITABINE-TENOFOVIR AF 200-25 MG PO TABS
1.0000 | ORAL_TABLET | Freq: Every day | ORAL | Status: DC
  Administered 2016-10-01: 1 via ORAL
  Filled 2016-09-30: qty 1

## 2016-09-30 MED ORDER — SODIUM CHLORIDE 0.9 % IV BOLUS (SEPSIS)
1000.0000 mL | Freq: Once | INTRAVENOUS | Status: AC
  Administered 2016-09-30: 1000 mL via INTRAVENOUS

## 2016-09-30 MED ORDER — INSULIN ASPART 100 UNIT/ML ~~LOC~~ SOLN
0.0000 [IU] | Freq: Three times a day (TID) | SUBCUTANEOUS | Status: DC
  Administered 2016-09-30: 11 [IU] via SUBCUTANEOUS

## 2016-09-30 MED ORDER — ONDANSETRON HCL 4 MG/2ML IJ SOLN
4.0000 mg | Freq: Four times a day (QID) | INTRAMUSCULAR | Status: DC | PRN
  Administered 2016-09-30 (×3): 4 mg via INTRAVENOUS
  Filled 2016-09-30 (×4): qty 2

## 2016-09-30 MED ORDER — BOOST / RESOURCE BREEZE PO LIQD
1.0000 | Freq: Three times a day (TID) | ORAL | Status: DC
  Administered 2016-09-30: 1 via ORAL

## 2016-09-30 MED ORDER — HEPARIN SODIUM (PORCINE) 5000 UNIT/ML IJ SOLN
5000.0000 [IU] | Freq: Three times a day (TID) | INTRAMUSCULAR | Status: DC
  Administered 2016-09-30 – 2016-10-01 (×4): 5000 [IU] via SUBCUTANEOUS
  Filled 2016-09-30 (×4): qty 1

## 2016-09-30 MED ORDER — INSULIN GLARGINE 100 UNIT/ML ~~LOC~~ SOLN
7.0000 [IU] | Freq: Every day | SUBCUTANEOUS | Status: DC
  Administered 2016-09-30: 7 [IU] via SUBCUTANEOUS
  Filled 2016-09-30 (×2): qty 0.07

## 2016-09-30 MED ORDER — PANTOPRAZOLE SODIUM 40 MG IV SOLR
40.0000 mg | INTRAVENOUS | Status: DC
  Administered 2016-09-30 – 2016-10-01 (×2): 40 mg via INTRAVENOUS
  Filled 2016-09-30 (×2): qty 40

## 2016-09-30 MED ORDER — ONDANSETRON HCL 4 MG PO TABS: 4.0000 mg | ORAL_TABLET | Freq: Four times a day (QID) | ORAL | Status: DC | PRN

## 2016-09-30 MED ORDER — DOLUTEGRAVIR SODIUM 50 MG PO TABS
50.0000 mg | ORAL_TABLET | Freq: Every day | ORAL | Status: DC
  Administered 2016-10-01: 50 mg via ORAL
  Filled 2016-09-30: qty 1

## 2016-09-30 MED ORDER — POTASSIUM CHLORIDE IN NACL 20-0.9 MEQ/L-% IV SOLN
INTRAVENOUS | Status: AC
  Administered 2016-09-30 – 2016-10-01 (×3): via INTRAVENOUS
  Filled 2016-09-30 (×3): qty 1000

## 2016-09-30 MED ORDER — ACETAMINOPHEN 650 MG RE SUPP: 650.0000 mg | Freq: Four times a day (QID) | RECTAL | Status: DC | PRN

## 2016-09-30 MED ORDER — ACETAMINOPHEN 325 MG PO TABS: 650.0000 mg | ORAL_TABLET | Freq: Four times a day (QID) | ORAL | Status: DC | PRN

## 2016-09-30 NOTE — Assessment & Plan Note (Signed)
Hypokalemia: Secondary to vomiting. -Replace with potassium and fluids

## 2016-09-30 NOTE — Progress Notes (Signed)
Admission note:  Arrival Method: via wheelchair Mental Orientation: A&Ox4 Assessment: completed Skin: intact IV: NS 1L bolus infusing at R wrist Pain: denies Tubes: none Safety Measures: fall prevention plan discussed and reviewed with pt, verbalized understanding. Admission Screening: in progress 6700 Orientation: Patient has been oriented to the unit, staff and to the room. Call bell and telephone within reach, family at bedside.

## 2016-09-30 NOTE — Assessment & Plan Note (Signed)
Patient reports compliance with antiretrovirals. We will recheck CD4 count and viral load.

## 2016-09-30 NOTE — Assessment & Plan Note (Signed)
Orthostatic hypotension: Likely secondary to poor by mouth intake and vomiting. Patient is lightheaded. -1 L normal saline bolus in the clinic now -Followed by normal saline at 1 25 mL/h with potassium -Repeat orthostatic vital signs tomorrow morning -Hold lisinopril and other antihypertensives

## 2016-09-30 NOTE — H&P (Signed)
Date: 09/30/2016               Patient Name:  Kathy Frank MRN: [id number]  DOB: 1975/09/09 Age / Sex: 41 y.o., female   PCP: Riccardo Dubin, MD         Medical Service: Internal Medicine Teaching Service         Attending Physician: Dr. Aldine Contes, MD    First Contact: Dr. Reesa Chew Pager: [phone]  Second Contact: Dr. Posey Pronto Pager: [phone]       After Hours (After 5p/  First Contact Pager: [phone]  weekends / holidays): Second Contact Pager: [phone]   Chief Complaint: n/v, epigastric abd pain  History of Present Illness:   41 yo F with hx of HIV (last CD4 280 05/2016, detectable VL), CKD 3, THC use, DM II with diabetic gastroparesis, GERD, presents with n/v since Saturday. She has been having vomiting throughout the day since Saturday, mostly whatever she drinks, but ocassionally also coffee ground materials. She had continous vomiting until yesterday when she was seen in clinic. Was prescribed zofran and protonix with improvement of her symptoms. But she started vomiting again this morning. She has gastroparesis related vomiting in the past  but has been well controlled for last 1 year. Does use THC intermittently but no cyclic vomiting in the past. No dysuria, last BM on Thursday, has not been able to eat anything, was drinking gatorade but can't keep it down either. Niece had confirmed influenza recently but patient denies any runny nose, cough, headache, but does have myalgias, no fevers. She has epigastric abd pain which feels like burning sensation, without radiation, never had EGD, is not on chronic PPI.  She presented back in the clinic today with on going n/v, was found to have orthostatic hypotension from dehydration, along with AKI (b/l crt 1.3, today 3.15), and also K+ 3.0. She was admitted due to this.    Meds:  Current Facility-Administered Medications for the 09/30/16 encounter [facility] Encounter)  Medication  . promethazine (PHENERGAN) injection 12.5 mg   No  outpatient prescriptions have been marked as taking for the 09/30/16 encounter [facility] Encounter).     Allergies: Allergies as of 09/30/2016  . (No Known Allergies)   Past Medical History:  Diagnosis Date  . Cyclical vomiting    THC induced???  . Depression   . DKA (diabetic ketoacidoses) ([place]) 12/2014  . Erosive esophagitis   . Gastroparesis    ? diabetic  . Human immunodeficiency virus (HIV) disease   . Hypertension   . Normocytic anemia   . Type II diabetes mellitus ([place]) 1994   diagnosed around 82    Family History:  Family History  Problem Relation Age of Onset  . Diabetes Mother   . Diabetes Brother   . Diabetes Daughter   . Diabetes Daughter   . Mental retardation Brother     died from PNA     Social History:  Social History   Social History  . Marital status: Single    Spouse name: N/A  . Number of children: N/A  . Years of education: 10   Occupational History  .  Unemployed   Social History Main Topics  . Smoking status: Former Smoker    Packs/day: 0.10    Years: 10.00    Types: Cigarettes    Start date: 11/17/2013    Quit date: 09/09/2014  . Smokeless tobacco: Never Used  . Alcohol use Yes     Comment:  11/13/2013 "occasionally; once or twice/month"  . Drug use: Yes    Frequency: 4.0 times per week    Types: Marijuana  . Sexual activity: Not Currently    Partners: Female, Female     Comment: given condoms   Other Topics Concern  . Not on file   Social History Narrative  . No narrative on file     Review of Systems: A complete ROS was negative except as per HPI.    Physical Exam: Temperature 98 F (36.7 C), temperature source Oral, resp. rate 18, height 5\' 5"  (1.651 m), weight 169 lb 6.4 oz (76.8 kg), SpO2 98 %. Physical Exam  Constitutional: She is oriented to person, place, and time. She appears well-developed and well-nourished.  HENT:  Head: Normocephalic and atraumatic.  Mouth/Throat: No oropharyngeal exudate.  Eyes: Right  eye exhibits no discharge. Left eye exhibits no discharge.  Neck: Normal range of motion. No JVD present.  Cardiovascular: Normal rate and regular rhythm.  Exam reveals no gallop and no friction rub.   No murmur heard. Respiratory: Effort normal and breath sounds normal. No respiratory distress. She has no wheezes. She has no rales.  GI: Soft. Bowel sounds are normal. She exhibits no distension. There is no rebound.  Epigastric tenderness, reproducible with palpation.   Musculoskeletal: Normal range of motion. She exhibits no edema or deformity.  Neurological: She is alert and oriented to person, place, and time. No cranial nerve deficit.     EKG: none  CXR: none  Assessment & Plan by Problem: Active Problems:   Diabetes mellitus type 2 in obese (HCC)   Nausea & vomiting   Essential hypertension   Esophageal reflux   Coffee ground emesis   Diabetic gastroparesis (HCC)   Human immunodeficiency virus (HIV) disease (HCC)   Acute renal failure (ARF) ([place])  41 yo F with DM II with gastroparesis, HIV, CKD 3 presents with n/v found to have AKI and orthostatic hypotension.  Possible viral gastroenteritis  Patient had sick contact (niece was sick with influenza), she has n/v and myalgias but no URI symptoms. This could be a viral gastroenteritis or influenza. Other ddx include cyclic vomiting given her THC use  (less likely since she has not had similar symptom for last 1 year despite using THC). Also could be her diabetic gastroparesis causing vomiting but also less likely as that has been well controlled for last 1 year. Last BM was Thursday but abdominal exam showed no distension or pain other than the epigastric pain so SBO is less likely. -check for influenza, not sure if would benefit from tamilfu if + since her symptoms started 4 days ago. - treat supportively with IVF, zofran  AKI with CKD 3 B/l crt 1.3, now 3.15. Mostly pre-renal from vomiting and dehydration. Hold ACEI - cont to  monitor for improvement with IVF  Orthostatic hypotension - likely 2/2 to dehydration. Cont IVF and recheck  Epigastric pain - most likely 2/2 to GERD exacerbated by vomitting Never had EGD, usually does not take PPI, her symptoms started or worsened after vomiting - will do protonix IV and GI cocktail. May not need PPI long term as it may improve after her vomiting stops.  DM II with gastroparesis  hgba1c 7.5. On metformin and Lantus at home. - cont reduced dose lantus, hold metformin, do SSI here  HIV - well controlled with CD4 280 on 05/2016 -recheck HIV RNA quant + CD4. - cont ART  HTN -hold aceI as above, hold  amlodipine for now given she is orthostatic  Dispo: Admit patient to Observation with expected length of stay less than 2 midnights.  Signed: Dellia Nims, MD 09/30/2016, 11:53 AM  Pager: [phone]

## 2016-09-30 NOTE — Progress Notes (Signed)
Internal Medicine Clinic Attending  Case discussed with Dr. Burns at the time of the visit.  We reviewed the resident's history and exam and pertinent patient test results.  I agree with the assessment, diagnosis, and plan of care documented in the resident's note.  

## 2016-09-30 NOTE — Assessment & Plan Note (Signed)
Nausea with vomiting: Likely secondary to viral gastroenteritis. Given lack of URI symptoms, I doubt influenza. She is outside the window for treatment either way. CBC shows no leukocytosis. I doubt small bowel obstruction given minimal abdominal pain other than epigastric region and no evidence of distention. She is passing gas and has hypoactive bowel sounds on exam. -Zofran 4 mg IV every 6 hours when necessary -Protonix IV daily

## 2016-09-30 NOTE — Progress Notes (Signed)
CC: nausea and vomiting  HPI: [name] is a 41 y.o. female with PMHx of HIV (Last CD4 280 in 10/17), Marijuana Use, CKD III, HTN, and T2DM who presents to the clinic with nausea and vomiting.  Patient was first seen on 2/20 with complaint of a 4 day history of nausea with non-bloody emesis. Her nausea and vomiting was also associated with fatigue, aches, chills, heartburn and epigastric/throat pain after onset of vomiting. She denied lightheadedness, fever, nasal congestion, cough, diarrhea, dysuria, headache, ear pain, chest pain, wheezing. She had been drinking Gatorade to stay hydrated and was able to keep food and liquids down, but will occasionally throw them up. She did admit to a sick exposure to her grandchild last week who was diagnosed with the flu. Of note, patient has a chronic history of nausea and vomiting that was previously attributed to diabetic gastroparesis from uncontrolled diabetes or due to marijuana use. However, she has done well for years off of Reglan, PPI, H2 blocker. She stated that this nausea and vomiting did not seem similar to her previous episodes. She does admit to occasional marijuana use, but denies alcohol or tobacco use. Patient was discharged home from the clinic with Zofran and Protonix. Patient states that she did well yesterday afternoon, but this morning she awoke feeling ill. She had persistent nausea with vomiting and was unable to keep fluids or food down. She feels very lightheaded and weak. She continues to deny any upper respiratory symptoms. Her main complaints today are the lightheadedness, nausea, vomiting, epigastric and chest pain.  Patient has a history of HIV. Her last CD4 count was 280 in October 2017. She reports compliance with her antiretrovirals.  In clinic patient is ill-appearing. Her vitals show that she is orthostatic, afebrile, tachycardic, hypertensive but satting well on room air. Labs reveal AKI with a creatinine of 3. She  is also hyponatremic and hypokalemic at 3.0. She is hyperglycemic in the 300s.   Past Medical History:  Diagnosis Date  . Cyclical vomiting    THC induced???  . Depression   . DKA (diabetic ketoacidoses) ([place]) 12/2014  . Erosive esophagitis   . Gastroparesis    ? diabetic  . Human immunodeficiency virus (HIV) disease   . Hypertension   . Normocytic anemia   . Type II diabetes mellitus ([place]) 1994   diagnosed around 1994    Review of Systems: Please see pertinent ROS reviewed in HPI and problem based charting.   Physical Exam: Vitals:   09/30/16 0847 09/30/16 0849 09/30/16 0850 09/30/16 0852  BP: (!) 161/99 (!) 161/99 (!) 141/96 (!) 134/95  Pulse: 95 95 93 (!) 103  Temp: 98 F (36.7 C)     TempSrc: Oral     SpO2:  100%    Weight: 172 lb (78 kg)      General: Vital signs reviewed.  Patient is Ill-appearing, in no acute distress and cooperative with exam.  Cardiovascular: Tachycardic, regular rhythm, S1 normal, S2 normal, no murmurs, gallops, or rubs. Pulmonary/Chest: Clear to auscultation bilaterally, no wheezes, rales, or rhonchi. Abdominal: Soft, mildly tender in epigastric region, non-distended, hypoactive BS +, no guarding present.  Extremities: No lower extremity edema bilaterally Neurological: A&O x3 Skin: Warm, dry and intact. No rashes or erythema. normal skin turgor  Psychiatric: Normal mood and affect. speech and behavior is normal. Cognition and memory are normal.   Assessment & Plan:  See encounters tab for problem based medical decision making. Patient discussed with Dr.  Butcher  [name] is a 41 y.o. female with PMHx of HIV (Last CD4 68 in 10/17), Marijuana Use, CKD III, HTN, and T2DM who presents to the clinic with nausea and vomiting.  Patient was first seen on 2/20 with complaint of a 4 day history of nausea with non-bloody emesis. Her nausea and vomiting was also associated with fatigue, aches, chills, heartburn and epigastric/throat pain after  onset of vomiting. She denied lightheadedness, fever, nasal congestion, cough, diarrhea, dysuria, headache, ear pain, chest pain, wheezing. She had been drinking Gatorade to stay hydrated and was able to keep food and liquids down, but will occasionally throw them up. She did admit to a sick exposure to her grandchild last week who was diagnosed with the flu. Of note, patient has a chronic history of nausea and vomiting that was previously attributed to diabetic gastroparesis from uncontrolled diabetes or due to marijuana use. However, she has done well for years off of Reglan, PPI, H2 blocker. She stated that this nausea and vomiting did not seem similar to her previous episodes. She does admit to occasional marijuana use, but denies alcohol or tobacco use. Patient was discharged home from the clinic with Zofran and Protonix. Patient states that she did well yesterday afternoon, but this morning she awoke feeling ill. She had persistent nausea with vomiting and was unable to keep fluids or food down. She feels very lightheaded and weak. She continues to deny any upper respiratory symptoms. Her main complaints today are the lightheadedness, nausea, vomiting, epigastric and chest pain.  Patient has a history of HIV. Her last CD4 count was 280 in October 2017. She reports compliance with her antiretrovirals.  In clinic patient is ill-appearing. Her vitals show that she is orthostatic, afebrile, tachycardic, hypertensive but satting well on room air. Labs reveal AKI with a creatinine of 3. She is also hyponatremic and hypokalemic at 3.0. She is hyperglycemic in the 300s.  Assessment: 1. Acute on chronic kidney injury: Creatinine increased from baseline to 3.15 from baseline of 1.6-1.9. This is likely prerenal in the setting of vomiting and poor by mouth intake. -Basic metabolic panel tomorrow morning -1 L normal saline bolus in the clinic now -Followed by normal saline at 125 mL/h with potassium  2. Nausea  with vomiting: Likely secondary to viral gastroenteritis. Given lack of URI symptoms, I doubt influenza. She is outside the window for treatment either way. CBC shows no leukocytosis. I doubt small bowel obstruction given minimal abdominal pain other than epigastric region and no evidence of distention. She is passing gas and has hypoactive bowel sounds on exam. -Zofran 4 mg IV every 6 hours when necessary -Protonix IV daily  3. Orthostatic hypotension: Likely secondary to poor by mouth intake and vomiting. Patient is lightheaded. -1 L normal saline bolus in the clinic now -Followed by normal saline at 1 25 mL/h with potassium -Repeat orthostatic vital signs tomorrow morning -Hold lisinopril and other antihypertensives  4. Hypokalemia: Secondary to vomiting. -Replace with potassium and fluids

## 2016-09-30 NOTE — Progress Notes (Addendum)
Report was called to [place] on [address].  Patient is alert and responds to questions.  Family member here to visit.  Patient to be transported via wheelchair to [facility] [address].  Sander Nephew, RN 09/30/2016 10:29 AM IV bolus hung.  IV in right hand continues.   Patient transported via wheelchair to [address].  Sander Nephew, RN 09/30/2016 10:56 AM

## 2016-09-30 NOTE — Assessment & Plan Note (Signed)
Acute on chronic kidney injury: Creatinine increased from baseline to 3.15 from baseline of 1.6-1.9. This is likely prerenal in the setting of vomiting and poor by mouth intake. -Basic metabolic panel tomorrow morning -1 L normal saline bolus in the clinic now -Followed by normal saline at 125 mL/h with potassium

## 2016-10-01 ENCOUNTER — Telehealth: Payer: Self-pay

## 2016-10-01 DIAGNOSIS — Z794 Long term (current) use of insulin: Secondary | ICD-10-CM

## 2016-10-01 DIAGNOSIS — Z79899 Other long term (current) drug therapy: Secondary | ICD-10-CM

## 2016-10-01 DIAGNOSIS — R112 Nausea with vomiting, unspecified: Secondary | ICD-10-CM | POA: Diagnosis not present

## 2016-10-01 DIAGNOSIS — A084 Viral intestinal infection, unspecified: Secondary | ICD-10-CM | POA: Diagnosis not present

## 2016-10-01 DIAGNOSIS — Z21 Asymptomatic human immunodeficiency virus [HIV] infection status: Secondary | ICD-10-CM | POA: Diagnosis not present

## 2016-10-01 DIAGNOSIS — E1143 Type 2 diabetes mellitus with diabetic autonomic (poly)neuropathy: Secondary | ICD-10-CM

## 2016-10-01 DIAGNOSIS — K3184 Gastroparesis: Secondary | ICD-10-CM | POA: Diagnosis not present

## 2016-10-01 DIAGNOSIS — E1122 Type 2 diabetes mellitus with diabetic chronic kidney disease: Secondary | ICD-10-CM

## 2016-10-01 DIAGNOSIS — N183 Chronic kidney disease, stage 3 (moderate): Secondary | ICD-10-CM

## 2016-10-01 DIAGNOSIS — N179 Acute kidney failure, unspecified: Secondary | ICD-10-CM

## 2016-10-01 LAB — BASIC METABOLIC PANEL
Anion gap: 11 (ref 5–15)
BUN: 23 mg/dL — ABNORMAL HIGH (ref 6–20)
CO2: 21 mmol/L — ABNORMAL LOW (ref 22–32)
Calcium: 8 mg/dL — ABNORMAL LOW (ref 8.9–10.3)
Chloride: 104 mmol/L (ref 101–111)
Creatinine, Ser: 2.45 mg/dL — ABNORMAL HIGH (ref 0.44–1.00)
GFR calc Af Amer: 27 mL/min — ABNORMAL LOW (ref >60)
GFR calc non Af Amer: 24 mL/min — ABNORMAL LOW (ref >60)
Glucose, Bld: 74 mg/dL (ref 65–99)
Potassium: 3.7 mmol/L (ref 3.5–5.1)
Sodium: 136 mmol/L (ref 135–145)

## 2016-10-01 LAB — CBC
HCT: 32.8 % — ABNORMAL LOW (ref 36.0–46.0)
Hemoglobin: 11.1 g/dL — ABNORMAL LOW (ref 12.0–15.0)
MCH: 30.1 pg (ref 26.0–34.0)
MCHC: 33.8 g/dL (ref 30.0–36.0)
MCV: 88.9 fL (ref 78.0–100.0)
Platelets: 165 10*3/uL (ref 150–400)
RBC: 3.69 MIL/uL — ABNORMAL LOW (ref 3.87–5.11)
RDW: 13.5 % (ref 11.5–15.5)
WBC: 2.8 10*3/uL — ABNORMAL LOW (ref 4.0–10.5)

## 2016-10-01 LAB — GLUCOSE, CAPILLARY
Glucose-Capillary: 81 mg/dL (ref 65–99)
Glucose-Capillary: 124 mg/dL — ABNORMAL HIGH (ref 65–99)

## 2016-10-01 MED ORDER — BOOST / RESOURCE BREEZE PO LIQD: 1.0000 | Freq: Three times a day (TID) | ORAL | 0 refills | Status: DC

## 2016-10-01 NOTE — Progress Notes (Signed)
   Subjective: Patient was feeling better this morning. Her nausea and vomiting has been dissolved. Denies any  abdominal pain. Her orthostatic vitals were positive for decrease in blood pressure, she remained asymptomatic and denies any dizziness.  Objective:  Vital signs in last 24 hours: Vitals:   09/30/16 1800 09/30/16 2059 10/01/16 0500 10/01/16 0921  BP: 135/75 129/72 (!) 142/92   Pulse: 85 77 79   Resp: 18 19 20 20   Temp: 98.5 F (36.9 C) 98.6 F (37 C) 98.2 F (36.8 C) 98.9 F (37.2 C)  TempSrc: Oral Oral Oral Oral  SpO2: 100% 100% 100% 100%  Weight:  173 lb 3.2 oz (78.6 kg)    Height:       Gen. Well-developed, well-nourished lady, in no acute distress. Chest. Clear bilaterally. CVS. Regular rate and rhythm Abdomen. Soft, nontender, bowel sounds positive. Extremities. No edema, no cyanosis, pulses 2+ bilaterally.  Labs. CBC Latest Ref Rng & Units 10/01/2016 09/30/2016 05/27/2016  WBC 4.0 - 10.5 K/uL 2.8(L) 3.8(L) 4.1  Hemoglobin 12.0 - 15.0 g/dL 11.1(L) 12.6 11.9  Hematocrit 36.0 - 46.0 % 32.8(L) 36.5 35.1  Platelets 150 - 400 K/uL 165 210 320   BMP Latest Ref Rng & Units 10/01/2016 09/30/2016 05/29/2016  Glucose 65 - 99 mg/dL 74 313(H) 166(H)  BUN 6 - 20 mg/dL 23(H) 36(H) 20  Creatinine 0.44 - 1.00 mg/dL 2.45(H) 3.15(H) 1.99(H)  BUN/Creat Ratio 9 - 23 - - -  Sodium 135 - 145 mmol/L 136 132(L) 137  Potassium 3.5 - 5.1 mmol/L 3.7 3.0(L) 4.0  Chloride 101 - 111 mmol/L 104 94(L) 114(H)  CO2 22 - 32 mmol/L 21(L) 23 16(L)  Calcium 8.9 - 10.3 mg/dL 8.0(L) 8.9 8.4(L)   Influenza panel. Negative. Magnesium. 1.9  Assessment/Plan:  41 yo F with hx of HIV (last CD4 280 05/2016, detectable VL), CKD 3, THC use, DM II with diabetic gastroparesis, GERD, presents with n/v since Saturday, found to have AK I and orthostatic hypotension.  Possible viral gastroenteritis. Most likely the cause of her dehydration and symptoms . Her symptoms resolved with IV fluids. She was having  orthostatic vitals with decrease in blood pressure on standing without any symptoms, as her blood pressures remained within normal limits. -We'll recheck orthostatic vitals in the afternoon. -She will be discharged if remains stable.  Acute on chronic kidney disease. Most likely due to azotemia in the setting of dehydration. Her kidney functions are improving, creatinine was 2.45 this morning, baseline is between 1.7-1.9. She will need a BMP on hospital follow-up.  DM II with gastroparesis. No other current symptoms. She can resume back her home regimen of Lantus and metformin on discharge.  HIV. Well controlled with CD4 280 on 05/2016 -Continue ART.   Dispo: Being discharged today.  Lorella Nimrod, MD 10/01/2016, 1:29 PM Pager: [phone]

## 2016-10-01 NOTE — Progress Notes (Signed)
Kathy Frank to be D/C'd Home per MD order.  Discussed prescriptions and follow up appointments with the patient. Prescriptions given to patient, medication list explained in detail. Pt verbalized understanding.  Allergies as of 10/01/2016   No Known Allergies     Medication List    TAKE these medications   ACCU-CHEK FASTCLIX LANCETS Misc Check blood sugar 3 times a day   ACCU-CHEK GUIDE w/Device Kit 1 each by Does not apply route 3 (three) times daily.   amLODipine 5 MG tablet Commonly known as:  NORVASC Take 1 tablet (5 mg total) by mouth daily.   dolutegravir 50 MG tablet Commonly known as:  TIVICAY Take 1 tablet (50 mg total) by mouth daily.   emtricitabine-tenofovir AF 200-25 MG tablet Commonly known as:  DESCOVY Take 1 tablet by mouth daily.   feeding supplement Liqd Take 1 Container by mouth 3 (three) times daily between meals.   glucose blood test strip Commonly known as:  ACCU-CHEK GUIDE Use to check blood sugars 3 times a day. Code: E11.69.   Insulin Glargine 100 UNIT/ML Solostar Pen Commonly known as:  LANTUS Inject 15 Units into the skin daily at 10 pm. What changed:  when to take this   Insulin Pen Needle 32G X 4 MM Misc 1 Units by Does not apply route daily.   lisinopril 20 MG tablet Commonly known as:  PRINIVIL,ZESTRIL Take 1 tablet (20 mg total) by mouth daily.   metFORMIN 1000 MG tablet Commonly known as:  GLUCOPHAGE Take 1 tablet (1,000 mg total) by mouth daily with breakfast.   ondansetron 4 MG tablet Commonly known as:  ZOFRAN Take 1 tablet (4 mg total) by mouth every 6 (six) hours as needed for nausea or vomiting.   pantoprazole 40 MG tablet Commonly known as:  PROTONIX Take 1 tablet (40 mg total) by mouth daily.   sodium bicarbonate 650 MG tablet Take 1 tablet (650 mg total) by mouth 2 (two) times daily.   traZODone 50 MG tablet Commonly known as:  DESYREL Take 1 tablet (50 mg total) by mouth 3 (three) times daily. What changed:   when to take this   triamcinolone cream 0.1 % Commonly known as:  KENALOG Apply 1 application topically 2 (two) times daily.       Vitals:   10/01/16 0500 10/01/16 0921  BP: (!) 142/92   Pulse: 79   Resp: 20 20  Temp: 98.2 F (36.8 C) 98.9 F (37.2 C)    Skin clean, dry and intact without evidence of skin break down, no evidence of skin tears noted. IV catheter discontinued intact. Site without signs and symptoms of complications. Dressing and pressure applied. Pt denies pain at this time. No complaints noted.  An After Visit Summary was printed and given to the patient. Patient escorted via [place], and D/C home via private auto.  Emilio Math, RN [facility] Phone [phone]

## 2016-10-01 NOTE — Telephone Encounter (Signed)
Hospital TOC per Dr Reesa Chew, discharge 10/01/2016, appt 10/06/2016

## 2016-10-01 NOTE — Discharge Summary (Signed)
Name: Kathy Frank MRN: [id number] DOB: 05-26-1976 41 y.o. PCP: Riccardo Dubin, MD  Date of Admission: 09/30/2016 11:11 AM Date of Discharge: 10/01/2016 Attending Physician: Aldine Contes, MD  Discharge Diagnosis: 1. Viral gastroenteritis.  Active Problems:   Diabetes mellitus type 2 in obese (HCC)   Nausea & vomiting   Essential hypertension   Esophageal reflux   Coffee ground emesis   Diabetic gastroparesis (HCC)   Human immunodeficiency virus (HIV) disease ([place])   Acute renal failure (ARF) ([place])   Discharge Medications: Allergies as of 10/01/2016   No Known Allergies     Medication List    TAKE these medications   ACCU-CHEK FASTCLIX LANCETS Misc Check blood sugar 3 times a day   ACCU-CHEK GUIDE w/Device Kit 1 each by Does not apply route 3 (three) times daily.   amLODipine 5 MG tablet Commonly known as:  NORVASC Take 1 tablet (5 mg total) by mouth daily.   dolutegravir 50 MG tablet Commonly known as:  TIVICAY Take 1 tablet (50 mg total) by mouth daily.   emtricitabine-tenofovir AF 200-25 MG tablet Commonly known as:  DESCOVY Take 1 tablet by mouth daily.   feeding supplement Liqd Take 1 Container by mouth 3 (three) times daily between meals.   glucose blood test strip Commonly known as:  ACCU-CHEK GUIDE Use to check blood sugars 3 times a day. Code: E11.69.   Insulin Glargine 100 UNIT/ML Solostar Pen Commonly known as:  LANTUS Inject 15 Units into the skin daily at 10 pm. What changed:  when to take this   Insulin Pen Needle 32G X 4 MM Misc 1 Units by Does not apply route daily.   lisinopril 20 MG tablet Commonly known as:  PRINIVIL,ZESTRIL Take 1 tablet (20 mg total) by mouth daily.   metFORMIN 1000 MG tablet Commonly known as:  GLUCOPHAGE Take 1 tablet (1,000 mg total) by mouth daily with breakfast.   ondansetron 4 MG tablet Commonly known as:  ZOFRAN Take 1 tablet (4 mg total) by mouth every 6 (six) hours as needed for nausea or  vomiting.   pantoprazole 40 MG tablet Commonly known as:  PROTONIX Take 1 tablet (40 mg total) by mouth daily.   sodium bicarbonate 650 MG tablet Take 1 tablet (650 mg total) by mouth 2 (two) times daily.   traZODone 50 MG tablet Commonly known as:  DESYREL Take 1 tablet (50 mg total) by mouth 3 (three) times daily. What changed:  when to take this   triamcinolone cream 0.1 % Commonly known as:  KENALOG Apply 1 application topically 2 (two) times daily.       Disposition and follow-up:   Kathy Frank was discharged from [facility] in Good condition.  At the hospital follow up visit please address:  1.  Her orthostatic vitals and any symptoms related to that. -Her renal functions.  2.  Labs / imaging needed at time of follow-up: BMP  3.  Pending labs/ test needing follow-up: None  Follow-up Appointments: Follow-up Information    Charlott Rakes, MD Follow up on 10/06/2016.   Specialty:  Internal Medicine Why:  At 10:15 AM Contact information: [place] [address] [phone]           Hospital Course by problem list:  41 yo F with hx of HIV (last CD4 280 05/2016, detectable VL), CKD 3, THC use, DM II with diabetic gastroparesis, GERD, presents with n/v since Saturday, found to have  AK I and orthostatic hypotension.  Possible viral gastroenteritis. Most likely the cause of her dehydration and symptoms.She had a positive contact history as her niece was sick with flu, she was influenza PCR negative. Her symptoms resolved with IV fluids. She continued to have positive orthostatic vitals without any symptoms. She was able to tolerate food very well. She is being discharged home with a close follow-up in clinic.  Acute on chronic kidney disease. Most likely due to azotemia in the setting of dehydration. Her kidney functions are improving, creatinine was 2.45 this morning, baseline is between 1.7-1.9. She will need a BMP on hospital  follow-up.  DM II with gastroparesis. No other current symptoms. She can resume back her home regimen of Lantus and metformin on discharge.  HIV. Well controlled with CD4 280 on 05/2016 -Continue ART.   Discharge Vitals:   BP (!) 142/92 (BP Location: Left Arm)   Pulse 79   Temp 98.9 F (37.2 C) (Oral)   Resp 20   Ht '5\' 5"'$  (1.651 m)   Wt 173 lb 3.2 oz (78.6 kg)   LMP 09/17/2016   SpO2 100%   BMI 28.82 kg/m   Orthostatic VS for the past 24 hrs:  BP- Lying Pulse- Lying BP- Sitting Pulse- Sitting BP- Standing at 0 minutes Pulse- Standing at 0 minutes  10/01/16 1310 168/75 75 163/90 79 138/86 85  10/01/16 0921 159/84 75 150/85 83 - -   Gen. Well-developed, well-nourished lady, in no acute distress. Chest. Clear bilaterally. CVS. Regular rate and rhythm Abdomen. Soft, nontender, bowel sounds positive. Extremities. No edema, no cyanosis, pulses 2+ bilaterally.  Pertinent Labs, Studies, and Procedures:   CBC    Component Value Date/Time   WBC 2.8 (L) 10/01/2016 0550   RBC 3.69 (L) 10/01/2016 0550   HGB 11.1 (L) 10/01/2016 0550   HCT 32.8 (L) 10/01/2016 0550   HCT 31.6 (L) 04/23/2016 1535   PLT 165 10/01/2016 0550   PLT 242 04/23/2016 1535   MCV 88.9 10/01/2016 0550   MCV 84 04/23/2016 1535   MCH 30.1 10/01/2016 0550   MCHC 33.8 10/01/2016 0550   RDW 13.5 10/01/2016 0550   RDW 15.3 04/23/2016 1535   LYMPHSABS 1.2 09/30/2016 0850   LYMPHSABS 1.2 04/23/2016 1535   MONOABS 0.4 09/30/2016 0850   EOSABS 0.0 09/30/2016 0850   EOSABS 0.2 04/23/2016 1535   BASOSABS 0.1 09/30/2016 0850   BASOSABS 0.0 04/23/2016 1535   BMP Latest Ref Rng & Units 10/01/2016 09/30/2016 05/29/2016  Glucose 65 - 99 mg/dL 74 313(H) 166(H)  BUN 6 - 20 mg/dL 23(H) 36(H) 20  Creatinine 0.44 - 1.00 mg/dL 2.45(H) 3.15(H) 1.99(H)  BUN/Creat Ratio 9 - 23 - - -  Sodium 135 - 145 mmol/L 136 132(L) 137  Potassium 3.5 - 5.1 mmol/L 3.7 3.0(L) 4.0  Chloride 101 - 111 mmol/L 104 94(L) 114(H)  CO2 22 -  32 mmol/L 21(L) 23 16(L)  Calcium 8.9 - 10.3 mg/dL 8.0(L) 8.9 8.4(L)    Influenza panel. Negative. Magnesium. 1.9  Discharge Instructions: Discharge Instructions    Diet - low sodium heart healthy    Complete by:  As directed    Discharge instructions    Complete by:  As directed    It was pleasure taking care of you. Please resume your home medicines as directed including your blood pressure medicine. Please follow-up at clinic on February 27 at 10:15 AM.   Increase activity slowly    Complete by:  As directed  Signed: Lorella Nimrod, MD 10/01/2016, 3:29 PM   Pager: [phone]

## 2016-10-01 NOTE — Progress Notes (Addendum)
Initial Nutrition Assessment  DOCUMENTATION CODES:   Not applicable  INTERVENTION:  Encourage adequate PO intake.   Plans to discharge today.  NUTRITION DIAGNOSIS:   Increased nutrient needs related to chronic illness as evidenced by estimated needs.  GOAL:   Patient will meet greater than or equal to 90% of their needs  MONITOR:   PO intake, Supplement acceptance, Labs, Weight trends, Skin, I & O's  REASON FOR ASSESSMENT:   Malnutrition Screening Tool    ASSESSMENT:   40 yo F with hx of HIV (last CD4 280 05/2016, detectable VL), CKD 3, THC use, DM II with diabetic gastroparesis, GERD, presents with n/v since Saturday, found to have AK I and orthostatic hypotension.  Pt reports her appetite has been improving and she has been able to tolerate her food at meals. Pt reports she was unable to keep po down since Saturday, 09/26/16. Pt reports usually eating well prior to the past Saturday with no other difficulties. Usual body weight reported to be ~180-182 lbs. No significant weight loss per weight records. Pt with no observed significant fat or muscle mass loss. Plans for discharge home today. Pt encouraged to eat her foods at meals for adequate nutrition.  Labs and medications reviewed.   Diet Order:  DIET SOFT Room service appropriate? Yes; Fluid consistency: Thin Diet - low sodium heart healthy  Skin:  Reviewed, no issues  Last BM:  2/15  Height:   Ht Readings from Last 1 Encounters:  09/30/16 5\' 5"  (1.651 m)    Weight:   Wt Readings from Last 1 Encounters:  09/30/16 173 lb 3.2 oz (78.6 kg)    Ideal Body Weight:  56.8 kg  BMI:  Body mass index is 28.82 kg/m.  Estimated Nutritional Needs:   Kcal:  1950-2150  Protein:  90-100 grams  Fluid:  1.9 - 2.1 L/day  EDUCATION NEEDS:   No education needs identified at this time  Corrin Parker, MS, RD, LDN Pager # [phone] After hours/ weekend pager # [phone]

## 2016-10-01 NOTE — Progress Notes (Signed)
Pt walked to RN station, tolerated well. Pt denies any dizziness and nausea.   Pt also had a shower earlier no complaints at this time.

## 2016-10-02 ENCOUNTER — Ambulatory Visit: Payer: Medicaid Other | Admitting: Internal Medicine

## 2016-10-02 ENCOUNTER — Encounter: Payer: Self-pay | Admitting: Internal Medicine

## 2016-10-06 ENCOUNTER — Ambulatory Visit (INDEPENDENT_AMBULATORY_CARE_PROVIDER_SITE_OTHER): Payer: Medicaid Other | Admitting: Pulmonary Disease

## 2016-10-06 VITALS — BP 149/85 | HR 84 | Temp 98.0°F | Ht 65.0 in | Wt 186.6 lb

## 2016-10-06 DIAGNOSIS — I129 Hypertensive chronic kidney disease with stage 1 through stage 4 chronic kidney disease, or unspecified chronic kidney disease: Secondary | ICD-10-CM | POA: Diagnosis not present

## 2016-10-06 DIAGNOSIS — I10 Essential (primary) hypertension: Secondary | ICD-10-CM

## 2016-10-06 DIAGNOSIS — Z8619 Personal history of other infectious and parasitic diseases: Secondary | ICD-10-CM

## 2016-10-06 DIAGNOSIS — Z87891 Personal history of nicotine dependence: Secondary | ICD-10-CM

## 2016-10-06 DIAGNOSIS — F329 Major depressive disorder, single episode, unspecified: Secondary | ICD-10-CM | POA: Diagnosis not present

## 2016-10-06 DIAGNOSIS — N183 Chronic kidney disease, stage 3 unspecified: Secondary | ICD-10-CM

## 2016-10-06 DIAGNOSIS — Z79899 Other long term (current) drug therapy: Secondary | ICD-10-CM

## 2016-10-06 DIAGNOSIS — F332 Major depressive disorder, recurrent severe without psychotic features: Secondary | ICD-10-CM

## 2016-10-06 DIAGNOSIS — Z09 Encounter for follow-up examination after completed treatment for conditions other than malignant neoplasm: Secondary | ICD-10-CM | POA: Diagnosis not present

## 2016-10-06 DIAGNOSIS — A084 Viral intestinal infection, unspecified: Secondary | ICD-10-CM

## 2016-10-06 MED ORDER — AMLODIPINE BESYLATE 10 MG PO TABS: 10.0000 mg | ORAL_TABLET | Freq: Every day | ORAL | 1 refills | Status: DC

## 2016-10-06 MED ORDER — TRAZODONE HCL 50 MG PO TABS: 100.0000 mg | ORAL_TABLET | Freq: Every day | ORAL | 1 refills | Status: DC

## 2016-10-06 MED ORDER — AMLODIPINE BESYLATE 5 MG PO TABS: 5.0000 mg | ORAL_TABLET | Freq: Every day | ORAL | 1 refills | Status: DC

## 2016-10-06 MED ORDER — DULOXETINE HCL 40 MG PO CPEP: 40.0000 mg | ORAL_CAPSULE | Freq: Every day | ORAL | 1 refills | Status: DC

## 2016-10-06 NOTE — Progress Notes (Signed)
Internal Medicine Clinic Attending  Case discussed with Dr. Krall at the time of the visit.  We reviewed the resident's history and exam and pertinent patient test results.  I agree with the assessment, diagnosis, and plan of care documented in the resident's note.  

## 2016-10-06 NOTE — Assessment & Plan Note (Signed)
PHQ9 improved to 14 but she feels like the trazodone is not helping and makes her too drowsy during the day. Wellbutrin did not help as well.  Change trazodone to 50mg  at bedtime. May discontinue at follow up if she does not want to take this any longer. Add duloxetine 40mg  daily.

## 2016-10-06 NOTE — Patient Instructions (Addendum)
I will call you with instructions on your blood pressure medication after we get the results of your blood work  Change your TRAZODONE to ONE tablet at bedtime.   Start taking DULOXETINE, ONE tablet a day.   Follow up in 4 weeks

## 2016-10-06 NOTE — Progress Notes (Signed)
   CC: viral gastroenteritis follow up  HPI:  [name] is a 41 y.o. woman with history as noted below here for follow up of viral gastroenteritis.  She was hospitalized 2/21 to 2/22 for viral gastroenteritis. She had nausea, vomiting, watery diarrhea the second evening she came home from the hospital. Resolved since then. Depression medication making her sleepy. No dizziness or lightheadedness   Past Medical History:  Diagnosis Date  . CKD (chronic kidney disease), stage III   . Cyclical vomiting    THC induced???  . Depression   . DKA (diabetic ketoacidoses) ([place]) 12/2014  . Erosive esophagitis   . Gastroparesis    ? diabetic  . GERD (gastroesophageal reflux disease)   . Human immunodeficiency virus (HIV) disease dx'd 04/2016  . Hypertension   . Normocytic anemia   . Type II diabetes mellitus ([place]) 1994   diagnosed around 1994    Review of Systems:   No fevers or chills No chest pain  Physical Exam:  Vitals:   10/06/16 1024 10/06/16 1113  BP: (!) 161/81 (!) 149/85  Pulse: 92 84  Temp: 98 F (36.7 C)   TempSrc: Oral   SpO2: 100%   Weight: 186 lb 9.6 oz (84.6 kg)   Height: 5\' 5"  (1.651 m)    General Apperance: NAD HEENT: Normocephalic, atraumatic, anicteric sclera Neck: Supple, trachea midline Lungs: Clear to auscultation bilaterally. No wheezes, rhonchi or rales. Breathing comfortably Heart: Regular rate and rhythm, no murmur/rub/gallop Abdomen: Soft, nontender, nondistended, no rebound/guarding Extremities: Warm and well perfused, no edema Skin: No rashes or lesions Neurologic: Alert and interactive. No gross deficits.  Assessment & Plan:   See Encounters Tab for problem based charting.  Patient discussed with Dr. Lynnae January

## 2016-10-06 NOTE — Assessment & Plan Note (Signed)
Assessment: resolved  Plan: recheck BMP today

## 2016-10-06 NOTE — Assessment & Plan Note (Addendum)
BP elevated to 149/85. She does not tolerate amlodipine 10mg  because of peripheral edema.  Continue amlodipine 5mg  daily Continue lisinopril 20mg  daily Rechecking BMP. Will determine adjustment of meds based on BMP  Addendum: Will keep meds same and reevaluate at follow up

## 2016-10-07 ENCOUNTER — Ambulatory Visit (INDEPENDENT_AMBULATORY_CARE_PROVIDER_SITE_OTHER): Payer: Medicaid Other | Admitting: Infectious Diseases

## 2016-10-07 ENCOUNTER — Other Ambulatory Visit (HOSPITAL_COMMUNITY)
Admission: RE | Admit: 2016-10-07 | Discharge: 2016-10-07 | Disposition: A | Discharge status: Home / Self Care | Payer: Medicaid Other | Source: Ambulatory Visit | Attending: Infectious Diseases | Admitting: Infectious Diseases

## 2016-10-07 ENCOUNTER — Encounter: Payer: Self-pay | Admitting: Infectious Diseases

## 2016-10-07 VITALS — BP 122/83 | HR 76 | Temp 98.4°F | Wt 186.0 lb

## 2016-10-07 DIAGNOSIS — N183 Chronic kidney disease, stage 3 unspecified: Secondary | ICD-10-CM

## 2016-10-07 DIAGNOSIS — Z113 Encounter for screening for infections with a predominantly sexual mode of transmission: Secondary | ICD-10-CM

## 2016-10-07 DIAGNOSIS — E08621 Diabetes mellitus due to underlying condition with foot ulcer: Secondary | ICD-10-CM

## 2016-10-07 DIAGNOSIS — B20 Human immunodeficiency virus [HIV] disease: Secondary | ICD-10-CM | POA: Diagnosis not present

## 2016-10-07 DIAGNOSIS — Z23 Encounter for immunization: Secondary | ICD-10-CM | POA: Diagnosis not present

## 2016-10-07 DIAGNOSIS — Z79899 Other long term (current) drug therapy: Secondary | ICD-10-CM

## 2016-10-07 DIAGNOSIS — E669 Obesity, unspecified: Secondary | ICD-10-CM

## 2016-10-07 DIAGNOSIS — L97521 Non-pressure chronic ulcer of other part of left foot limited to breakdown of skin: Secondary | ICD-10-CM

## 2016-10-07 DIAGNOSIS — E1169 Type 2 diabetes mellitus with other specified complication: Secondary | ICD-10-CM

## 2016-10-07 LAB — LIPID PANEL
Cholesterol: 182 mg/dL (ref <200)
HDL: 54 mg/dL (ref >50)
LDL Cholesterol: 113 mg/dL — ABNORMAL HIGH (ref <100)
Total CHOL/HDL Ratio: 3.4 Ratio (ref <5.0)
Triglycerides: 76 mg/dL (ref <150)
VLDL: 15 mg/dL (ref <30)

## 2016-10-07 LAB — BMP8+ANION GAP
Anion Gap: 17 mmol/L (ref 10.0–18.0)
BUN/Creatinine Ratio: 12 (ref 9–23)
BUN: 25 mg/dL — ABNORMAL HIGH (ref 6–20)
CO2: 18 mmol/L (ref 18–29)
Calcium: 8.9 mg/dL (ref 8.7–10.2)
Chloride: 102 mmol/L (ref 96–106)
Creatinine, Ser: 2.05 mg/dL — ABNORMAL HIGH (ref 0.57–1.00)
GFR calc Af Amer: 34 mL/min/{1.73_m2} — ABNORMAL LOW (ref >59)
GFR calc non Af Amer: 30 mL/min/{1.73_m2} — ABNORMAL LOW (ref >59)
Glucose: 329 mg/dL — ABNORMAL HIGH (ref 65–99)
Potassium: 4.6 mmol/L (ref 3.5–5.2)
Sodium: 137 mmol/L (ref 134–144)

## 2016-10-07 NOTE — Assessment & Plan Note (Signed)
Some worsening from her baseline.  Appreciate f/u with Dr Posey Pronto and his excellent care.

## 2016-10-07 NOTE — Progress Notes (Signed)
   Subjective:    Patient ID: Kathy Frank, female    DOB: 11-21-1975, 41 y.o.   MRN: [id number]  HPI 41 yo F with hx of HIV+ since 2017. She has been on genvoya.  She was also dx with Dm2 and had a DM foot infection.  Her course has been further complicated by nephropathy/CKD3.  She was in hospital 2-21 to 2-22 for orthostatic hypotension/AKI due to n/v.  Her art was changed to dtgv/descovy.   She is feeling better today. No n/v.  Is taking her ART "everyday".  Her A1C was 7.5% by her acct. Checks her FSG daily.  Her prev foot ulcer has healed, states she had psoriasis.   HIV 1 RNA Quant (copies/mL)  Date Value  05/27/2016 25 (H)   CD4 T Cell Abs (/uL)  Date Value  05/27/2016 280 (L)  04/23/2016 220 (L)    Review of Systems  Constitutional: Negative for appetite change, chills, fever and unexpected weight change.  Gastrointestinal: Positive for diarrhea. Negative for constipation.  Genitourinary: Negative for difficulty urinating.  Skin: Negative for wound.  Neurological: Negative for numbness.  takes occas OTC diarrhea rx.     Objective:   Physical Exam  Constitutional: She appears well-developed and well-nourished.  HENT:  Mouth/Throat: No oropharyngeal exudate.  Eyes: EOM are normal. Pupils are equal, round, and reactive to light.  Neck: Neck supple.  Cardiovascular: Normal rate, regular rhythm and normal heart sounds.   Pulmonary/Chest: Effort normal and breath sounds normal.  Abdominal: Soft. Bowel sounds are normal. There is no tenderness. There is no guarding.  Musculoskeletal: She exhibits no edema.  Lymphadenopathy:    She has no cervical adenopathy.  Neurological: No sensory deficit.   Thickened callus on R heel.     Assessment & Plan:

## 2016-10-07 NOTE — Assessment & Plan Note (Signed)
By her hx she is improved.  Her foot is well healed.  appreciate IMTS excellent care.

## 2016-10-07 NOTE — Assessment & Plan Note (Signed)
This is improved Will have her seen by podiatry for heel callus.

## 2016-10-07 NOTE — Assessment & Plan Note (Signed)
Creatinine improved back towards baseline of 2. Creatinine clearance around 40. Can keep lisinopril at same dose. Can also keep metformin at current dose. Will need close monitoring

## 2016-10-07 NOTE — Addendum Note (Signed)
Addended by: Roma Kayser on: 10/07/2016 11:33 AM   Modules accepted: Orders

## 2016-10-07 NOTE — Assessment & Plan Note (Signed)
Will get her pap smear Will check her labs today Gets mening #2 today.  Is given condoms Will see her back in 6 months.

## 2016-10-08 LAB — T-HELPER CELL (CD4) - (RCID CLINIC ONLY)
CD4 % Helper T Cell: 29 % — ABNORMAL LOW (ref 33–55)
CD4 T Cell Abs: 430 /uL (ref 400–2700)

## 2016-10-08 LAB — URINE CYTOLOGY ANCILLARY ONLY
Chlamydia: NEGATIVE
Neisseria Gonorrhea: NEGATIVE

## 2016-10-08 LAB — RPR

## 2016-10-09 ENCOUNTER — Telehealth: Payer: Self-pay

## 2016-10-09 LAB — HIV-1 RNA QUANT-NO REFLEX-BLD
HIV 1 RNA Quant: <20 copies/mL
HIV-1 RNA Quant, Log: <1.3 Log copies/mL

## 2016-10-09 NOTE — Telephone Encounter (Signed)
Called Pt to inform her of new upcoming appointment at  [facility] located at [place]  [phone] Her appointment is scheduled for 0945 on 10/14/2016 Pt did not answer. Will try to call again later today.

## 2016-10-14 ENCOUNTER — Ambulatory Visit: Payer: Medicaid Other | Admitting: Podiatry

## 2016-10-14 NOTE — Telephone Encounter (Signed)
No answer

## 2016-10-26 ENCOUNTER — Ambulatory Visit (INDEPENDENT_AMBULATORY_CARE_PROVIDER_SITE_OTHER): Payer: Medicaid Other | Admitting: Podiatry

## 2016-10-26 DIAGNOSIS — M79671 Pain in right foot: Secondary | ICD-10-CM

## 2016-10-26 DIAGNOSIS — L84 Corns and callosities: Secondary | ICD-10-CM | POA: Diagnosis not present

## 2016-10-26 DIAGNOSIS — L851 Acquired keratosis [keratoderma] palmaris et plantaris: Secondary | ICD-10-CM

## 2016-10-26 DIAGNOSIS — M79672 Pain in left foot: Secondary | ICD-10-CM | POA: Diagnosis not present

## 2016-10-26 NOTE — Progress Notes (Signed)
   Subjective: Patient presents to the office today for chief complaint of painful callus lesions of the feet. Patient states that the pain is ongoing and is affecting their ability to ambulate without pain. Patient presents today for further treatment and evaluation.  Objective:  Physical Exam General: Alert and oriented x3 in no acute distress  Dermatology: Hyperkeratotic lesion present on the weightbearing surface of the bilateral heels. Pain on palpation with a central nucleated core noted.  Skin is warm, dry and supple bilateral lower extremities. Negative for open lesions or macerations.  Vascular: Palpable pedal pulses bilaterally. No edema or erythema noted. Capillary refill within normal limits.  Neurological: Epicritic and protective threshold grossly intact bilaterally.   Musculoskeletal Exam: Pain on palpation at the keratotic lesion noted. Range of motion within normal limits bilateral. Muscle strength 5/5 in all groups bilateral.  Assessment: #1 painful callus lesions weightbearing surface of the bilateral heels   Plan of Care:  #1 Patient evaluated #2 Excisional debridement of  keratoic lesion using a chisel blade was performed without incident.  #3 recommend urea cream 40%  #4 Patient is to return to the clinic PRN.   Edrick Kins, DPM [facility]  Dr. Edrick Kins, [address]                                        [place], [place] [address]                Office [phone]  Fax [phone]

## 2016-10-29 NOTE — Telephone Encounter (Signed)
She states she is fine and doesn't have time to answer ?'s

## 2016-11-14 ENCOUNTER — Encounter (HOSPITAL_COMMUNITY): Payer: Self-pay | Admitting: Nurse Practitioner

## 2016-11-14 ENCOUNTER — Inpatient Hospital Stay (HOSPITAL_COMMUNITY)
Admission: EM | Admit: 2016-11-14 | Discharge: 2016-11-16 | DRG: 682 | Disposition: A | Discharge status: Home / Self Care | Payer: Medicaid Other | Attending: Oncology | Admitting: Oncology

## 2016-11-14 DIAGNOSIS — E1165 Type 2 diabetes mellitus with hyperglycemia: Secondary | ICD-10-CM | POA: Diagnosis present

## 2016-11-14 DIAGNOSIS — E876 Hypokalemia: Secondary | ICD-10-CM | POA: Diagnosis present

## 2016-11-14 DIAGNOSIS — E860 Dehydration: Secondary | ICD-10-CM | POA: Diagnosis present

## 2016-11-14 DIAGNOSIS — E1169 Type 2 diabetes mellitus with other specified complication: Secondary | ICD-10-CM | POA: Diagnosis present

## 2016-11-14 DIAGNOSIS — E1143 Type 2 diabetes mellitus with diabetic autonomic (poly)neuropathy: Secondary | ICD-10-CM | POA: Diagnosis present

## 2016-11-14 DIAGNOSIS — N183 Chronic kidney disease, stage 3 unspecified: Secondary | ICD-10-CM | POA: Diagnosis present

## 2016-11-14 DIAGNOSIS — N179 Acute kidney failure, unspecified: Principal | ICD-10-CM | POA: Diagnosis present

## 2016-11-14 DIAGNOSIS — Z87891 Personal history of nicotine dependence: Secondary | ICD-10-CM

## 2016-11-14 DIAGNOSIS — I129 Hypertensive chronic kidney disease with stage 1 through stage 4 chronic kidney disease, or unspecified chronic kidney disease: Secondary | ICD-10-CM | POA: Diagnosis present

## 2016-11-14 DIAGNOSIS — K3184 Gastroparesis: Secondary | ICD-10-CM | POA: Diagnosis present

## 2016-11-14 DIAGNOSIS — F329 Major depressive disorder, single episode, unspecified: Secondary | ICD-10-CM | POA: Diagnosis present

## 2016-11-14 DIAGNOSIS — E669 Obesity, unspecified: Secondary | ICD-10-CM | POA: Diagnosis present

## 2016-11-14 DIAGNOSIS — F1290 Cannabis use, unspecified, uncomplicated: Secondary | ICD-10-CM | POA: Diagnosis present

## 2016-11-14 DIAGNOSIS — R000 Tachycardia, unspecified: Secondary | ICD-10-CM | POA: Diagnosis present

## 2016-11-14 DIAGNOSIS — E1122 Type 2 diabetes mellitus with diabetic chronic kidney disease: Secondary | ICD-10-CM | POA: Diagnosis present

## 2016-11-14 DIAGNOSIS — Z6828 Body mass index (BMI) 28.0-28.9, adult: Secondary | ICD-10-CM

## 2016-11-14 DIAGNOSIS — B20 Human immunodeficiency virus [HIV] disease: Secondary | ICD-10-CM | POA: Diagnosis present

## 2016-11-14 DIAGNOSIS — K219 Gastro-esophageal reflux disease without esophagitis: Secondary | ICD-10-CM | POA: Diagnosis present

## 2016-11-14 DIAGNOSIS — Z79899 Other long term (current) drug therapy: Secondary | ICD-10-CM

## 2016-11-14 DIAGNOSIS — N189 Chronic kidney disease, unspecified: Secondary | ICD-10-CM | POA: Diagnosis present

## 2016-11-14 DIAGNOSIS — Z794 Long term (current) use of insulin: Secondary | ICD-10-CM

## 2016-11-14 DIAGNOSIS — Z7289 Other problems related to lifestyle: Secondary | ICD-10-CM

## 2016-11-14 DIAGNOSIS — E119 Type 2 diabetes mellitus without complications: Secondary | ICD-10-CM | POA: Diagnosis present

## 2016-11-14 DIAGNOSIS — I10 Essential (primary) hypertension: Secondary | ICD-10-CM | POA: Diagnosis present

## 2016-11-14 LAB — URINALYSIS, ROUTINE W REFLEX MICROSCOPIC
Bilirubin Urine: NEGATIVE
Glucose, UA: 150 mg/dL — AB
Hgb urine dipstick: NEGATIVE
Ketones, ur: NEGATIVE mg/dL
Nitrite: NEGATIVE
Protein, ur: >=300 mg/dL — AB
Specific Gravity, Urine: 1.015 (ref 1.005–1.030)
pH: 7 (ref 5.0–8.0)

## 2016-11-14 LAB — COMPREHENSIVE METABOLIC PANEL
ALT: 12 U/L — ABNORMAL LOW (ref 14–54)
AST: 17 U/L (ref 15–41)
Albumin: 3.1 g/dL — ABNORMAL LOW (ref 3.5–5.0)
Alkaline Phosphatase: 92 U/L (ref 38–126)
Anion gap: 15 (ref 5–15)
BUN: 29 mg/dL — ABNORMAL HIGH (ref 6–20)
CO2: 24 mmol/L (ref 22–32)
Calcium: 9.3 mg/dL (ref 8.9–10.3)
Chloride: 91 mmol/L — ABNORMAL LOW (ref 101–111)
Creatinine, Ser: 2.89 mg/dL — ABNORMAL HIGH (ref 0.44–1.00)
GFR calc Af Amer: 22 mL/min — ABNORMAL LOW (ref >60)
GFR calc non Af Amer: 19 mL/min — ABNORMAL LOW (ref >60)
Glucose, Bld: 378 mg/dL — ABNORMAL HIGH (ref 65–99)
Potassium: 3.3 mmol/L — ABNORMAL LOW (ref 3.5–5.1)
Sodium: 130 mmol/L — ABNORMAL LOW (ref 135–145)
Total Bilirubin: 0.8 mg/dL (ref 0.3–1.2)
Total Protein: 8.2 g/dL — ABNORMAL HIGH (ref 6.5–8.1)

## 2016-11-14 LAB — HCG, QUANTITATIVE, PREGNANCY: hCG, Beta Chain, Quant, S: 1 m[IU]/mL (ref <5)

## 2016-11-14 LAB — CBC
HCT: 37.9 % (ref 36.0–46.0)
Hemoglobin: 13.2 g/dL (ref 12.0–15.0)
MCH: 30.8 pg (ref 26.0–34.0)
MCHC: 34.8 g/dL (ref 30.0–36.0)
MCV: 88.6 fL (ref 78.0–100.0)
Platelets: 310 10*3/uL (ref 150–400)
RBC: 4.28 MIL/uL (ref 3.87–5.11)
RDW: 13.3 % (ref 11.5–15.5)
WBC: 7.3 10*3/uL (ref 4.0–10.5)

## 2016-11-14 LAB — I-STAT BETA HCG BLOOD, ED (MC, WL, AP ONLY): I-stat hCG, quantitative: 11.9 m[IU]/mL — ABNORMAL HIGH (ref <5)

## 2016-11-14 LAB — LIPASE, BLOOD: Lipase: 26 U/L (ref 11–51)

## 2016-11-14 MED ORDER — HYDROMORPHONE HCL 1 MG/ML IJ SOLN
1.0000 mg | Freq: Once | INTRAMUSCULAR | Status: AC
  Administered 2016-11-14: 1 mg via INTRAVENOUS
  Filled 2016-11-14: qty 1

## 2016-11-14 MED ORDER — POTASSIUM CHLORIDE CRYS ER 20 MEQ PO TBCR
40.0000 meq | EXTENDED_RELEASE_TABLET | Freq: Once | ORAL | Status: AC
  Administered 2016-11-14: 40 meq via ORAL
  Filled 2016-11-14: qty 2

## 2016-11-14 MED ORDER — ONDANSETRON HCL 4 MG/2ML IJ SOLN
4.0000 mg | Freq: Once | INTRAMUSCULAR | Status: AC
  Administered 2016-11-14: 4 mg via INTRAVENOUS
  Filled 2016-11-14: qty 2

## 2016-11-14 MED ORDER — SODIUM CHLORIDE 0.9 % IV BOLUS (SEPSIS)
1000.0000 mL | Freq: Once | INTRAVENOUS | Status: AC
  Administered 2016-11-14: 1000 mL via INTRAVENOUS

## 2016-11-14 MED ORDER — DIPHENHYDRAMINE HCL 50 MG/ML IJ SOLN
25.0000 mg | Freq: Once | INTRAMUSCULAR | Status: AC
  Administered 2016-11-14: 25 mg via INTRAVENOUS
  Filled 2016-11-14: qty 1

## 2016-11-14 MED ORDER — LACTATED RINGERS IV BOLUS (SEPSIS)
1000.0000 mL | Freq: Once | INTRAVENOUS | Status: AC
  Administered 2016-11-14: 1000 mL via INTRAVENOUS

## 2016-11-14 MED ORDER — METOCLOPRAMIDE HCL 5 MG/ML IJ SOLN
10.0000 mg | Freq: Once | INTRAMUSCULAR | Status: AC
  Administered 2016-11-14: 10 mg via INTRAVENOUS
  Filled 2016-11-14: qty 2

## 2016-11-14 NOTE — ED Triage Notes (Signed)
Pt presents with c/o abd pain. The pain began about 5 days ago. The pain is in her upper abdomen, and feels like a stabbing, sore pain. She reports chills, nausea, vomiting. She denies any urinary pain or frequency, vaginal discharge or bleeding, constipation, diarrhea. She has been unable to tolerate any oral intake. She has been taking her protonix with no improvement in her symptoms

## 2016-11-14 NOTE — ED Notes (Signed)
Taken to Xray at this time

## 2016-11-14 NOTE — H&P (Signed)
Date: 11/14/2016               Patient Name:  Kathy Frank MRN: [id number]  DOB: Apr 27, 1976 Age / Sex: 41 y.o., female   PCP: Riccardo Dubin, MD         Medical Service: Internal Medicine Teaching Service         Attending Physician: Dr. Annia Belt, MD    First Contact: Dr. Kalman Shan Pager: [phone]  Second Contact: Dr. Zada Finders Pager: [phone]       After Hours (After 5p /  First Contact Pager: [phone]  Weekends / Holidays): Second Contact Pager: [phone]   Chief Complaint: Nausea/Vomitting  History of Present Illness: [name] is a 41 y.o. female with a h/o of HIV, CKD, HTN, t2DM who presents with N/V.  Pt's symptoms begain on Wednesday w/o known precipitating factor. Since that time she has been unable to keep any food or fluid down. She attempted to take her oral meds by crushing them and swallowing with fluids, which was mildly successful for the first 2 days, but then was unable to keep them down. She denies any significant abd pain, no diarrhea or constipation. She does reports some chills and associated postural lightheadedness with standing. She also endorses some burning chest pain with wretching. Denies any HA, fever, urinary symptoms. Pt does reports some MJ use occasionally, but notes last use was several weeks ago. She denies any other substance use.  Pt has a long h/o gastroparesis. Recently hospitalized in February with concerns for viral gastroenteritis, at which time she was febrile. Her symptoms completely resolved after that brief hospitalization until Wednesday. She notes that prior to this admission she had not had problems with N/V for roughly 1 year and had not been medicated for gastroparesis. She does have a h/o reglan use PRN for gastroparesis > 32yrago.  In the ED, she was found to he normotensive and tachycardic and was given 2L NS bolus. Her tachycardia resolved and she becamge hypertensive in the setting of having missed her  home meds. She continued to have N/V and intolerance of PO inspite of antiemetic therapy. She was noted to have AKI on CKD and IMTS was called to admit for IV hydration and observation.  Meds: Current Outpatient Prescriptions  Medication Sig Dispense Refill  . amLODipine (NORVASC) 5 MG tablet Take 1 tablet (5 mg total) by mouth daily. 30 tablet 1  . dolutegravir (TIVICAY) 50 MG tablet Take 1 tablet (50 mg total) by mouth daily. 90 tablet 3  . DULoxetine HCl 40 MG CPEP Take 40 mg by mouth daily. 30 capsule 1  . emtricitabine-tenofovir AF (DESCOVY) 200-25 MG tablet Take 1 tablet by mouth daily. 90 tablet 3  . feeding supplement (BOOST / RESOURCE BREEZE) LIQD Take 1 Container by mouth 3 (three) times daily between meals. 30 Container 0  . Insulin Glargine (LANTUS) 100 UNIT/ML Solostar Pen Inject 15 Units into the skin daily at 10 pm. (Patient taking differently: Inject 15 Units into the skin daily. ) 15 mL 5  . lisinopril (PRINIVIL,ZESTRIL) 20 MG tablet Take 1 tablet (20 mg total) by mouth daily. 90 tablet 3  . metFORMIN (GLUCOPHAGE) 1000 MG tablet Take 1 tablet (1,000 mg total) by mouth daily with breakfast. 30 tablet 11  . ondansetron (ZOFRAN) 4 MG tablet Take 1 tablet (4 mg total) by mouth every 6 (six) hours as needed for nausea or vomiting. 20 tablet 0  . pantoprazole (  PROTONIX) 40 MG tablet Take 1 tablet (40 mg total) by mouth daily. 30 tablet 1  . sodium bicarbonate 650 MG tablet Take 1 tablet (650 mg total) by mouth 2 (two) times daily. 60 tablet 2  . traZODone (DESYREL) 50 MG tablet Take 2 tablets (100 mg total) by mouth at bedtime. 90 tablet 1  . triamcinolone cream (KENALOG) 0.1 % Apply 1 application topically 2 (two) times daily. 30 g 0  . ACCU-CHEK FASTCLIX LANCETS MISC Check blood sugar 3 times a day 102 each 12  . Blood Glucose Monitoring Suppl (ACCU-CHEK GUIDE) w/Device KIT 1 each by Does not apply route 3 (three) times daily. 1 kit 1  . glucose blood (ACCU-CHEK GUIDE) test strip Use  to check blood sugars 3 times a day. Code: E11.69. (Patient not taking: Reported on 09/30/2016) 100 each 11  . Insulin Pen Needle 32G X 4 MM MISC 1 Units by Does not apply route daily. 100 each 2   Allergies: Allergies as of 11/14/2016  . (No Known Allergies)   Past Medical History:  Diagnosis Date  . CKD (chronic kidney disease), stage III   . Cyclical vomiting    THC induced???  . Depression   . DKA (diabetic ketoacidoses) ([place]) 12/2014  . Erosive esophagitis   . Gastroparesis    ? diabetic  . GERD (gastroesophageal reflux disease)   . Human immunodeficiency virus (HIV) disease dx'd 04/2016  . Hypertension   . Normocytic anemia   . Type II diabetes mellitus ([place]) 1994   diagnosed around 66   Family History: Pt family history includes Diabetes in her brother, daughter, daughter, and mother; Mental retardation in her brother.  Social History: Pt  reports that she quit smoking about 2 years ago. Her smoking use included Cigarettes. She started smoking about 2 years ago. She has a 1.00 pack-year smoking history. She has never used smokeless tobacco. She reports that she drinks alcohol. She reports that she uses drugs, including Marijuana, about 4 times per week.  Review of Systems: A complete ROS was negative except as per HPI. Review of Systems  Constitutional: Positive for chills. Negative for fever and weight loss.  Eyes: Negative for blurred vision.  Respiratory: Negative for cough and shortness of breath.   Cardiovascular: Negative for chest pain and leg swelling.  Gastrointestinal: Positive for nausea and vomiting. Negative for abdominal pain, constipation and diarrhea.  Genitourinary: Negative for dysuria, frequency and urgency.  Musculoskeletal: Negative for myalgias.  Skin: Negative for rash.  Neurological: Positive for dizziness and weakness. Negative for tremors and headaches.  Endo/Heme/Allergies: Negative for polydipsia.  Psychiatric/Behavioral: The patient is not  nervous/anxious.    Physical Exam: Vitals:   11/14/16 2115 11/14/16 2130 11/14/16 2200 11/14/16 2215  BP: (!) 159/48 (!) 163/41 (!) 151/53 (!) 172/89  Pulse: 88 88 88 91  Resp:      Temp:      TempSrc:      SpO2: 100% 100% 100% 100%   Physical Exam  Constitutional: She is oriented to person, place, and time. She appears well-developed. She is cooperative. No distress.  HENT:  Head: Normocephalic and atraumatic.  Right Ear: Hearing normal.  Left Ear: Hearing normal.  Nose: Nose normal.  Mouth/Throat: Mucous membranes are dry.  Cardiovascular: Normal rate, regular rhythm, S1 normal, S2 normal and intact distal pulses.  Exam reveals no gallop.   No murmur heard. Pulmonary/Chest: Effort normal and breath sounds normal. No respiratory distress. She has no wheezes. She has no  rhonchi. She has no rales. She exhibits no tenderness. Breasts are symmetrical.  Abdominal: Soft. Normal appearance. She exhibits no ascites. Bowel sounds are decreased. There is no hepatosplenomegaly. There is tenderness (mild) in the epigastric area. There is no CVA tenderness.  Musculoskeletal: Normal range of motion.  Neurological: She is alert and oriented to person, place, and time. She has normal strength.  Skin: Skin is warm, dry and intact. She is not diaphoretic.  Psychiatric: She has a normal mood and affect. Her speech is normal and behavior is normal.   Labs: CBC:  Recent Labs Lab 11/14/16 1732  WBC 7.3  HGB 13.2  HCT 37.9  MCV 88.6  PLT 270   Basic Metabolic Panel:  Recent Labs Lab 11/14/16 1732  NA 130*  K 3.3*  CL 91*  CO2 24  GLUCOSE 378*  BUN 29*  CREATININE 2.89*  CALCIUM 9.3   Liver Function Tests:  Recent Labs Lab 11/14/16 1732  AST 17  ALT 12*  ALKPHOS 92  BILITOT 0.8  PROT 8.2*  ALBUMIN 3.1*    Recent Labs Lab 11/14/16 1756  LIPASE 26   CBG: Lab Results  Component Value Date   HGBA1C 7.5 09/29/2016   Urinalysis    Component Value Date/Time    COLORURINE YELLOW 11/14/2016 1739   APPEARANCEUR HAZY (A) 11/14/2016 1739   LABSPEC 1.015 11/14/2016 1739   PHURINE 7.0 11/14/2016 1739   GLUCOSEU 150 (A) 11/14/2016 1739   HGBUR NEGATIVE 11/14/2016 1739   BILIRUBINUR NEGATIVE 11/14/2016 1739   KETONESUR NEGATIVE 11/14/2016 1739   PROTEINUR >=300 (A) 11/14/2016 1739   NITRITE NEGATIVE 11/14/2016 1739   LEUKOCYTESUR TRACE (A) 11/14/2016 1739   Assessment & Plan by Problem: Active Problems:   Diabetic gastroparesis ([place])  Kathy Frank is a 41 y.o. female with PMH of HIV, CKD and t2DM with gastroparesis who presents with N/V.  1) Nausea / Vomitting 2/2 diabetic gastroparesis: Pt unable to tolerate PO nutrition/hydration after anti-emitics in the ED. Has known h/o gastroparesis, w/ hypoactive BS. Also h/o MJ use, but reportedly none recently. Will treat with IVF and antiemetics, monitor ON. - metoclopramide q6h PRN - NS @ 100cc/hr x 10 hr + 24mq K  2) AKI on CKD w/ hypokalemia: SCr 2.78 from b/l around 2.0. Likely 2/2 dehydration in the setting of N/V, w/ DMM. Will give fluids. - IVF as above - recheck BMP in AM  3) DM w/ hyperglycemia: Serum BG 378 on admission, no ketones in urine, no AG. Home meds: Lantus 15U qHS + Metformin '100mg'$  qD. - continue Lantus 10U qHS - add SSI-s qAC/HS - hold metforming for AKI  4) HTN: BP elevated on presentation to 140s/100s. Will continue home meds: amlodipine '5mg'$ , but hold lisinopril for AKI. Give hydral IV PRN BP >180/110.  5) HIV: Currently on Descovy, follows w/ [facility]. CD4 was 430 w/ undetectable HIV on 10/07/16. Continue home meds.  DVT PPx - low molecular weight heparin  Code Status - Full  Dispo: Admit patient to Observation with expected length of stay less than 2 midnights.  Signed: BHolley Raring MD 11/14/2016, 10:26 PM  Pager: [phone]

## 2016-11-14 NOTE — ED Notes (Signed)
Md at bedside

## 2016-11-14 NOTE — ED Provider Notes (Signed)
[facility] DEPT Provider Note   CSN: [id number] Arrival date & time: 11/14/16  1656     History   Chief Complaint Chief Complaint  Patient presents with  . Abdominal Pain    HPI Kathy Frank is a 41 y.o. female.  HPI   41 yo F with PMHx DM, CKD here with nausea and vomiting. Pt reports 3 days of severe nausea, vomiting. She has also had severe epigastric pain that is aching, throbbing. Pain worse with movement and palpation. No alleviating factors. Has been unable to eat or drink despite taking her antiemetics. H/o gastroparesis and states current sx are same. Also has h/o HIV but has been taking meds. No fevr or chills. No lower abdominal pain.  Past Medical History:  Diagnosis Date  . CKD (chronic kidney disease), stage III   . Cyclical vomiting    THC induced???  . Depression   . DKA (diabetic ketoacidoses) ([place]) 12/2014  . Erosive esophagitis   . Gastroparesis    ? diabetic  . GERD (gastroesophageal reflux disease)   . Human immunodeficiency virus (HIV) disease dx'd 04/2016  . Hypertension   . Normocytic anemia   . Type II diabetes mellitus ([place]) 1994   diagnosed around 1994    Patient Active Problem List   Diagnosis Date Noted  . Acute-on-chronic kidney injury ([place]) 11/14/2016  . Aortic atherosclerosis ([place]) 09/30/2016  . Orthostatic hypotension 09/30/2016  . Hypokalemia 09/30/2016  . Diabetic foot ulcer associated with diabetes mellitus due to underlying condition ([place]) 07/08/2016  . CKD (chronic kidney disease) stage 3, GFR 30-59 ml/min 06/19/2016  . Human immunodeficiency virus (HIV) disease ([place]) 04/23/2016  . Contact dermatitis 04/21/2016  . Diabetic gastroparesis ([place])   . Moderate nonproliferative diabetic retinopathy of both eyes ([place]) 11/21/2014  . Esophageal reflux   . Microscopic hematuria 10/31/2014  . Essential hypertension 11/16/2013  . Diabetic nephropathy ([place]) 12/07/2012  . TOBACCO USER 06/28/2006  . Depression 06/28/2006  . Diabetes  mellitus type 2 in obese [facility]) 06/28/1994    Past Surgical History:  Procedure Laterality Date  . TUBAL LIGATION  2002    OB History    No data available       Home Medications    Prior to Admission medications   Medication Sig Start Date End Date Taking? Authorizing Provider  amLODipine (NORVASC) 5 MG tablet Take 1 tablet (5 mg total) by mouth daily. 10/06/16  Yes Milagros Loll, MD  dolutegravir (TIVICAY) 50 MG tablet Take 1 tablet (50 mg total) by mouth daily. 07/08/16  Yes Campbell Riches, MD  DULoxetine HCl 40 MG CPEP Take 40 mg by mouth daily. 10/06/16  Yes Milagros Loll, MD  emtricitabine-tenofovir AF (DESCOVY) 200-25 MG tablet Take 1 tablet by mouth daily. 07/08/16  Yes Campbell Riches, MD  feeding supplement (BOOST / RESOURCE BREEZE) LIQD Take 1 Container by mouth 3 (three) times daily between meals. 10/01/16  Yes Lorella Nimrod, MD  Insulin Glargine (LANTUS) 100 UNIT/ML Solostar Pen Inject 15 Units into the skin daily at 10 pm. Patient taking differently: Inject 15 Units into the skin daily.  05/22/16  Yes Rushil Sherrye Payor, MD  lisinopril (PRINIVIL,ZESTRIL) 20 MG tablet Take 1 tablet (20 mg total) by mouth daily. 07/10/16  Yes Rushil Sherrye Payor, MD  metFORMIN (GLUCOPHAGE) 1000 MG tablet Take 1 tablet (1,000 mg total) by mouth daily with breakfast. 05/22/16  Yes Rushil Sherrye Payor, MD  ondansetron (ZOFRAN) 4 MG tablet Take 1 tablet (4 mg  total) by mouth every 6 (six) hours as needed for nausea or vomiting. 09/29/16  Yes Alexa Angela Burke, MD  pantoprazole (PROTONIX) 40 MG tablet Take 1 tablet (40 mg total) by mouth daily. 09/29/16  Yes Alexa Angela Burke, MD  sodium bicarbonate 650 MG tablet Take 1 tablet (650 mg total) by mouth 2 (two) times daily. 06/19/16 06/19/17 Yes Shela Leff, MD  traZODone (DESYREL) 50 MG tablet Take 2 tablets (100 mg total) by mouth at bedtime. 10/06/16  Yes Milagros Loll, MD  triamcinolone cream (KENALOG) 0.1 % Apply 1 application topically 2 (two) times  daily. 03/27/16  Yes Janne Napoleon, NP  ACCU-CHEK FASTCLIX LANCETS MISC Check blood sugar 3 times a day 09/09/16   Riccardo Dubin, MD  Blood Glucose Monitoring Suppl (ACCU-CHEK GUIDE) w/Device KIT 1 each by Does not apply route 3 (three) times daily. 09/09/16   Rushil Sherrye Payor, MD  glucose blood (ACCU-CHEK GUIDE) test strip Use to check blood sugars 3 times a day. Code: E11.69. Patient not taking: Reported on 09/30/2016 06/22/16   Riccardo Dubin, MD  Insulin Pen Needle 32G X 4 MM MISC 1 Units by Does not apply route daily. 11/14/14   Lucious Groves, DO    Family History Family History  Problem Relation Age of Onset  . Diabetes Mother   . Diabetes Brother   . Diabetes Daughter   . Diabetes Daughter   . Mental retardation Brother     died from PNA    Social History Social History  Substance Use Topics  . Smoking status: Former Smoker    Packs/day: 0.10    Years: 10.00    Types: Cigarettes    Start date: 11/17/2013    Quit date: 09/09/2014  . Smokeless tobacco: Never Used  . Alcohol use Yes     Comment: 09/30/2016  "maybe birthdays or holidays; very occasional"     Allergies   Patient has no known allergies.   Review of Systems Review of Systems  Constitutional: Positive for fatigue. Negative for chills and fever.  HENT: Negative for congestion and rhinorrhea.   Eyes: Negative for visual disturbance.  Respiratory: Negative for cough, shortness of breath and wheezing.   Cardiovascular: Negative for chest pain and leg swelling.  Gastrointestinal: Positive for abdominal pain, nausea and vomiting. Negative for diarrhea.  Genitourinary: Negative for dysuria and flank pain.  Musculoskeletal: Negative for neck pain and neck stiffness.  Skin: Negative for rash and wound.  Allergic/Immunologic: Negative for immunocompromised state.  Neurological: Positive for weakness. Negative for syncope and headaches.  All other systems reviewed and are negative.    Physical Exam Updated Vital  Signs BP (!) 164/108 (BP Location: Right Arm)   Pulse 92   Temp 98.6 F (37 C) (Oral)   Resp 18   Ht '5\' 5"'$  (1.651 m)   Wt 173 lb (78.5 kg)   SpO2 100%   BMI 28.79 kg/m   Physical Exam  Constitutional: She is oriented to person, place, and time. She appears well-developed and well-nourished. She appears distressed.  HENT:  Head: Normocephalic and atraumatic.  Eyes: Conjunctivae are normal.  Neck: Neck supple.  Cardiovascular: Normal rate, regular rhythm and normal heart sounds.  Exam reveals no friction rub.   No murmur heard. Pulmonary/Chest: Effort normal and breath sounds normal. No respiratory distress. She has no wheezes. She has no rales.  Abdominal: Normal appearance. She exhibits no distension. Bowel sounds are increased. There is generalized tenderness. There is no rigidity,  no rebound and no guarding.  Musculoskeletal: She exhibits no edema.  Neurological: She is alert and oriented to person, place, and time. She exhibits normal muscle tone.  Skin: Skin is warm. Capillary refill takes less than 2 seconds.  Psychiatric: She has a normal mood and affect.  Nursing note and vitals reviewed.    ED Treatments / Results  Labs (all labs ordered are listed, but only abnormal results are displayed) Labs Reviewed  COMPREHENSIVE METABOLIC PANEL - Abnormal; Notable for the following:       Result Value   Sodium 130 (*)    Potassium 3.3 (*)    Chloride 91 (*)    Glucose, Bld 378 (*)    BUN 29 (*)    Creatinine, Ser 2.89 (*)    Total Protein 8.2 (*)    Albumin 3.1 (*)    ALT 12 (*)    GFR calc non Af Amer 19 (*)    GFR calc Af Amer 22 (*)    All other components within normal limits  URINALYSIS, ROUTINE W REFLEX MICROSCOPIC - Abnormal; Notable for the following:    APPearance HAZY (*)    Glucose, UA 150 (*)    Protein, ur >=300 (*)    Leukocytes, UA TRACE (*)    Bacteria, UA FEW (*)    Squamous Epithelial / LPF 0-5 (*)    All other components within normal limits   GLUCOSE, CAPILLARY - Abnormal; Notable for the following:    Glucose-Capillary 301 (*)    All other components within normal limits  I-STAT BETA HCG BLOOD, ED ([facility], [facility], [facility] ONLY) - Abnormal; Notable for the following:    I-stat hCG, quantitative 11.9 (*)    All other components within normal limits  CBC  LIPASE, BLOOD  HCG, QUANTITATIVE, PREGNANCY  BASIC METABOLIC PANEL  CBC  MAGNESIUM    EKG  EKG Interpretation  Date/Time:  Saturday November 14 2016 18:04:34 EDT Ventricular Rate:  88 PR Interval:    QRS Duration: 93 QT Interval:  408 QTC Calculation: 494 R Axis:   68 Text Interpretation:  Sinus rhythm LAE, consider biatrial enlargement Probable left ventricular hypertrophy Borderline prolonged QT interval No significant change since last tracing Confirmed by Alexianna Nachreiner MD, Lysbeth Galas [id number]) on 11/15/2016 12:39:08 AM       Radiology No results found.  Procedures Procedures (including critical care time)  Medications Ordered in ED Medications  amLODipine (NORVASC) tablet 5 mg (not administered)  DULoxetine (CYMBALTA) DR capsule 40 mg (not administered)  pantoprazole (PROTONIX) EC tablet 40 mg (not administered)  dolutegravir (TIVICAY) tablet 50 mg (not administered)  emtricitabine-tenofovir AF (DESCOVY) 200-25 MG per tablet 1 tablet (not administered)  feeding supplement (BOOST / RESOURCE BREEZE) liquid 1 Container (not administered)  enoxaparin (LOVENOX) injection 30 mg (not administered)  0.9 % NaCl with KCl 40 mEq / L  infusion (not administered)  senna-docusate (Senokot-S) tablet 1 tablet (not administered)  insulin glargine (LANTUS) injection 10 Units (not administered)  insulin aspart (novoLOG) injection 0-5 Units (not administered)  insulin aspart (novoLOG) injection 0-9 Units (not administered)  metoCLOPramide (REGLAN) 5 MG/5ML solution 10 mg (not administered)    Or  metoCLOPramide (REGLAN) injection 10 mg (not administered)  sodium chloride 0.9 % bolus 1,000 mL (0  mLs Intravenous Stopped 11/14/16 1858)  metoCLOPramide (REGLAN) injection 10 mg (10 mg Intravenous Given 11/14/16 1758)  diphenhydrAMINE (BENADRYL) injection 25 mg (25 mg Intravenous Given 11/14/16 1758)  HYDROmorphone (DILAUDID) injection 1 mg (1 mg Intravenous Given  11/14/16 1758)  lactated ringers bolus 1,000 mL (0 mLs Intravenous Stopped 11/14/16 2017)  ondansetron (ZOFRAN) injection 4 mg (4 mg Intravenous Given 11/14/16 1915)  potassium chloride SA (K-DUR,KLOR-CON) CR tablet 40 mEq (40 mEq Oral Given 11/14/16 1915)  HYDROmorphone (DILAUDID) injection 1 mg (1 mg Intravenous Given 11/14/16 2037)     Initial Impression / Assessment and Plan / ED Course  I have reviewed the triage vital signs and the nursing notes.  Pertinent labs & imaging results that were available during my care of the patient were reviewed by me and considered in my medical decision making (see chart for details).    41 yo F with h/o DM, CKD here with severe abdominal pain, nausea, vomiting c/w acute on chronic gastroparesis. On arrival, pt tachycardic, hypertensive. Labs show AKI, likely prerenal 2/2 vomiting. Given h/o CKD, will admit for IVF. She continues to feel nauseous, poor PO intake despite antiemetics.  Final Clinical Impressions(s) / ED Diagnoses   Final diagnoses:  Dehydration  Acute renal failure superimposed on chronic kidney disease, unspecified CKD stage, unspecified acute renal failure type [facility])    New Prescriptions Current Discharge Medication List       Duffy Bruce, MD 11/15/16 0041

## 2016-11-15 ENCOUNTER — Encounter (HOSPITAL_COMMUNITY): Payer: Self-pay

## 2016-11-15 DIAGNOSIS — Z7289 Other problems related to lifestyle: Secondary | ICD-10-CM | POA: Diagnosis not present

## 2016-11-15 DIAGNOSIS — K219 Gastro-esophageal reflux disease without esophagitis: Secondary | ICD-10-CM | POA: Diagnosis present

## 2016-11-15 DIAGNOSIS — R112 Nausea with vomiting, unspecified: Secondary | ICD-10-CM

## 2016-11-15 DIAGNOSIS — K3184 Gastroparesis: Secondary | ICD-10-CM | POA: Diagnosis present

## 2016-11-15 DIAGNOSIS — N183 Chronic kidney disease, stage 3 (moderate): Secondary | ICD-10-CM | POA: Diagnosis present

## 2016-11-15 DIAGNOSIS — N179 Acute kidney failure, unspecified: Principal | ICD-10-CM

## 2016-11-15 DIAGNOSIS — Z79899 Other long term (current) drug therapy: Secondary | ICD-10-CM

## 2016-11-15 DIAGNOSIS — E1165 Type 2 diabetes mellitus with hyperglycemia: Secondary | ICD-10-CM

## 2016-11-15 DIAGNOSIS — E1143 Type 2 diabetes mellitus with diabetic autonomic (poly)neuropathy: Secondary | ICD-10-CM | POA: Diagnosis present

## 2016-11-15 DIAGNOSIS — E1122 Type 2 diabetes mellitus with diabetic chronic kidney disease: Secondary | ICD-10-CM | POA: Diagnosis present

## 2016-11-15 DIAGNOSIS — E876 Hypokalemia: Secondary | ICD-10-CM | POA: Diagnosis present

## 2016-11-15 DIAGNOSIS — Z794 Long term (current) use of insulin: Secondary | ICD-10-CM | POA: Diagnosis not present

## 2016-11-15 DIAGNOSIS — B20 Human immunodeficiency virus [HIV] disease: Secondary | ICD-10-CM | POA: Diagnosis present

## 2016-11-15 DIAGNOSIS — E860 Dehydration: Secondary | ICD-10-CM | POA: Diagnosis present

## 2016-11-15 DIAGNOSIS — Z6828 Body mass index (BMI) 28.0-28.9, adult: Secondary | ICD-10-CM | POA: Diagnosis not present

## 2016-11-15 DIAGNOSIS — I129 Hypertensive chronic kidney disease with stage 1 through stage 4 chronic kidney disease, or unspecified chronic kidney disease: Secondary | ICD-10-CM | POA: Diagnosis present

## 2016-11-15 DIAGNOSIS — E669 Obesity, unspecified: Secondary | ICD-10-CM | POA: Diagnosis present

## 2016-11-15 DIAGNOSIS — N189 Chronic kidney disease, unspecified: Secondary | ICD-10-CM | POA: Diagnosis not present

## 2016-11-15 DIAGNOSIS — Z21 Asymptomatic human immunodeficiency virus [HIV] infection status: Secondary | ICD-10-CM

## 2016-11-15 DIAGNOSIS — F329 Major depressive disorder, single episode, unspecified: Secondary | ICD-10-CM | POA: Diagnosis present

## 2016-11-15 DIAGNOSIS — Z87891 Personal history of nicotine dependence: Secondary | ICD-10-CM | POA: Diagnosis not present

## 2016-11-15 DIAGNOSIS — F1290 Cannabis use, unspecified, uncomplicated: Secondary | ICD-10-CM | POA: Diagnosis present

## 2016-11-15 DIAGNOSIS — R000 Tachycardia, unspecified: Secondary | ICD-10-CM | POA: Diagnosis present

## 2016-11-15 LAB — BASIC METABOLIC PANEL
Anion gap: 9 (ref 5–15)
BUN: 26 mg/dL — ABNORMAL HIGH (ref 6–20)
CO2: 25 mmol/L (ref 22–32)
Calcium: 8.5 mg/dL — ABNORMAL LOW (ref 8.9–10.3)
Chloride: 102 mmol/L (ref 101–111)
Creatinine, Ser: 2.43 mg/dL — ABNORMAL HIGH (ref 0.44–1.00)
GFR calc Af Amer: 28 mL/min — ABNORMAL LOW (ref >60)
GFR calc non Af Amer: 24 mL/min — ABNORMAL LOW (ref >60)
Glucose, Bld: 211 mg/dL — ABNORMAL HIGH (ref 65–99)
Potassium: 3.3 mmol/L — ABNORMAL LOW (ref 3.5–5.1)
Sodium: 136 mmol/L (ref 135–145)

## 2016-11-15 LAB — GLUCOSE, CAPILLARY
Glucose-Capillary: 166 mg/dL — ABNORMAL HIGH (ref 65–99)
Glucose-Capillary: 215 mg/dL — ABNORMAL HIGH (ref 65–99)
Glucose-Capillary: 227 mg/dL — ABNORMAL HIGH (ref 65–99)
Glucose-Capillary: 238 mg/dL — ABNORMAL HIGH (ref 65–99)
Glucose-Capillary: 301 mg/dL — ABNORMAL HIGH (ref 65–99)

## 2016-11-15 LAB — CBC
HCT: 32 % — ABNORMAL LOW (ref 36.0–46.0)
Hemoglobin: 11.2 g/dL — ABNORMAL LOW (ref 12.0–15.0)
MCH: 30.7 pg (ref 26.0–34.0)
MCHC: 35 g/dL (ref 30.0–36.0)
MCV: 87.7 fL (ref 78.0–100.0)
Platelets: 219 10*3/uL (ref 150–400)
RBC: 3.65 MIL/uL — ABNORMAL LOW (ref 3.87–5.11)
RDW: 13 % (ref 11.5–15.5)
WBC: 6.4 10*3/uL (ref 4.0–10.5)

## 2016-11-15 LAB — MAGNESIUM: Magnesium: 1.7 mg/dL (ref 1.7–2.4)

## 2016-11-15 MED ORDER — BOOST / RESOURCE BREEZE PO LIQD: 1.0000 | Freq: Three times a day (TID) | ORAL | Status: DC

## 2016-11-15 MED ORDER — HYDRALAZINE HCL 20 MG/ML IJ SOLN
10.0000 mg | Freq: Once | INTRAMUSCULAR | Status: AC
  Administered 2016-11-15: 10 mg via INTRAVENOUS
  Filled 2016-11-15: qty 1

## 2016-11-15 MED ORDER — POTASSIUM CHLORIDE IN NACL 40-0.9 MEQ/L-% IV SOLN
INTRAVENOUS | Status: AC
  Administered 2016-11-15: 100 mL/h via INTRAVENOUS
  Filled 2016-11-15: qty 1000

## 2016-11-15 MED ORDER — ENOXAPARIN SODIUM 30 MG/0.3ML ~~LOC~~ SOLN
30.0000 mg | Freq: Every day | SUBCUTANEOUS | Status: DC
  Administered 2016-11-15: 30 mg via SUBCUTANEOUS
  Filled 2016-11-15: qty 0.3

## 2016-11-15 MED ORDER — EMTRICITABINE-TENOFOVIR AF 200-25 MG PO TABS
1.0000 | ORAL_TABLET | Freq: Every day | ORAL | Status: DC
Start: 2016-11-15 — End: 2016-11-16
  Administered 2016-11-15 – 2016-11-16 (×2): 1 via ORAL
  Filled 2016-11-15 (×2): qty 1

## 2016-11-15 MED ORDER — ENOXAPARIN SODIUM 40 MG/0.4ML ~~LOC~~ SOLN
40.0000 mg | Freq: Every day | SUBCUTANEOUS | Status: DC
  Administered 2016-11-15: 40 mg via SUBCUTANEOUS
  Filled 2016-11-15: qty 0.4

## 2016-11-15 MED ORDER — PROMETHAZINE HCL 25 MG/ML IJ SOLN
25.0000 mg | Freq: Once | INTRAMUSCULAR | Status: AC
  Administered 2016-11-15: 25 mg via INTRAVENOUS
  Filled 2016-11-15: qty 1

## 2016-11-15 MED ORDER — DULOXETINE HCL 20 MG PO CPEP
40.0000 mg | ORAL_CAPSULE | Freq: Every day | ORAL | Status: DC
  Administered 2016-11-15 – 2016-11-16 (×2): 40 mg via ORAL
  Filled 2016-11-15 (×2): qty 2

## 2016-11-15 MED ORDER — PANTOPRAZOLE SODIUM 40 MG PO TBEC
40.0000 mg | DELAYED_RELEASE_TABLET | Freq: Every day | ORAL | Status: DC
  Administered 2016-11-15 – 2016-11-16 (×2): 40 mg via ORAL
  Filled 2016-11-15 (×2): qty 1

## 2016-11-15 MED ORDER — AMLODIPINE BESYLATE 5 MG PO TABS
5.0000 mg | ORAL_TABLET | Freq: Every day | ORAL | Status: DC
  Administered 2016-11-15 – 2016-11-16 (×2): 5 mg via ORAL
  Filled 2016-11-15 (×2): qty 1

## 2016-11-15 MED ORDER — METOCLOPRAMIDE HCL 5 MG/ML IJ SOLN
10.0000 mg | Freq: Four times a day (QID) | INTRAMUSCULAR | Status: DC | PRN
  Administered 2016-11-15: 10 mg via INTRAVENOUS
  Filled 2016-11-15 (×2): qty 2

## 2016-11-15 MED ORDER — POTASSIUM CHLORIDE CRYS ER 20 MEQ PO TBCR
40.0000 meq | EXTENDED_RELEASE_TABLET | Freq: Once | ORAL | Status: DC
  Filled 2016-11-15: qty 2

## 2016-11-15 MED ORDER — MORPHINE SULFATE (PF) 4 MG/ML IV SOLN
4.0000 mg | Freq: Once | INTRAVENOUS | Status: AC
  Administered 2016-11-15: 4 mg via INTRAVENOUS
  Filled 2016-11-15: qty 1

## 2016-11-15 MED ORDER — INSULIN ASPART 100 UNIT/ML ~~LOC~~ SOLN
0.0000 [IU] | Freq: Every day | SUBCUTANEOUS | Status: DC
  Administered 2016-11-15: 4 [IU] via SUBCUTANEOUS
  Administered 2016-11-15: 2 [IU] via SUBCUTANEOUS

## 2016-11-15 MED ORDER — GI COCKTAIL ~~LOC~~
30.0000 mL | Freq: Three times a day (TID) | ORAL | Status: DC | PRN
  Administered 2016-11-15: 30 mL via ORAL
  Filled 2016-11-15 (×3): qty 30

## 2016-11-15 MED ORDER — INSULIN GLARGINE 100 UNIT/ML ~~LOC~~ SOLN
15.0000 [IU] | Freq: Every day | SUBCUTANEOUS | Status: DC
  Administered 2016-11-15: 15 [IU] via SUBCUTANEOUS
  Filled 2016-11-15 (×2): qty 0.15

## 2016-11-15 MED ORDER — POTASSIUM CHLORIDE 20 MEQ/15ML (10%) PO SOLN
40.0000 meq | Freq: Once | ORAL | Status: AC
  Administered 2016-11-15: 40 meq via ORAL
  Filled 2016-11-15: qty 30

## 2016-11-15 MED ORDER — PROMETHAZINE HCL 25 MG/ML IJ SOLN: 25.0000 mg | Freq: Three times a day (TID) | INTRAMUSCULAR | Status: DC | PRN

## 2016-11-15 MED ORDER — ONDANSETRON HCL 4 MG/2ML IJ SOLN
4.0000 mg | Freq: Four times a day (QID) | INTRAMUSCULAR | Status: DC | PRN
  Administered 2016-11-15: 4 mg via INTRAVENOUS
  Filled 2016-11-15: qty 2

## 2016-11-15 MED ORDER — METOCLOPRAMIDE HCL 5 MG/5ML PO SOLN
10.0000 mg | Freq: Four times a day (QID) | ORAL | Status: DC | PRN
  Administered 2016-11-15 (×2): 10 mg via ORAL
  Filled 2016-11-15 (×2): qty 10

## 2016-11-15 MED ORDER — GI COCKTAIL ~~LOC~~
30.0000 mL | Freq: Once | ORAL | Status: AC
  Administered 2016-11-15: 30 mL via ORAL
  Filled 2016-11-15: qty 30

## 2016-11-15 MED ORDER — DOLUTEGRAVIR SODIUM 50 MG PO TABS
50.0000 mg | ORAL_TABLET | Freq: Every day | ORAL | Status: DC
  Administered 2016-11-15 – 2016-11-16 (×2): 50 mg via ORAL
  Filled 2016-11-15 (×2): qty 1

## 2016-11-15 MED ORDER — INSULIN ASPART 100 UNIT/ML ~~LOC~~ SOLN
0.0000 [IU] | Freq: Three times a day (TID) | SUBCUTANEOUS | Status: DC
  Administered 2016-11-15 – 2016-11-16 (×3): 3 [IU] via SUBCUTANEOUS

## 2016-11-15 MED ORDER — SENNOSIDES-DOCUSATE SODIUM 8.6-50 MG PO TABS
1.0000 | ORAL_TABLET | Freq: Every evening | ORAL | Status: DC | PRN
  Administered 2016-11-16: 1 via ORAL
  Filled 2016-11-15 (×2): qty 1

## 2016-11-15 MED ORDER — INSULIN GLARGINE 100 UNIT/ML ~~LOC~~ SOLN
10.0000 [IU] | Freq: Every day | SUBCUTANEOUS | Status: DC
  Administered 2016-11-15: 10 [IU] via SUBCUTANEOUS
  Filled 2016-11-15: qty 0.1

## 2016-11-15 NOTE — Progress Notes (Signed)
   Subjective:  Patient feeling better this morning. She does report a small amount of emesis earlier today, however has been able to tolerate liquids without issue, which she was unable to do on arrival. She has had similar episodes in the past related to her diabetic gastroparesis. She denies any fevers, chills, diaphoresis, or diarrhea.  Objective:  Vital signs in last 24 hours: Vitals:   11/15/16 0019 11/15/16 0212 11/15/16 0422 11/15/16 0655  BP:  (!) 190/110 (!) 148/91 (!) 162/106  Pulse: 92  94   Resp: 18  18   Temp: 98.6 F (37 C)  99.1 F (37.3 C)   TempSrc: Oral  Oral   SpO2: 100%  100%   Weight: 173 lb (78.5 kg)     Height: 5\' 5"  (1.651 m)      General: resting in bed, no acute distress Cardiac: RRR, no rubs, murmurs or gallops Pulm: clear to auscultation bilaterally, moving normal volumes of air Abd: soft, epigastric tenderness, nondistended, BS quiet but present Ext: warm and well perfused, no pedal edema Neuro: alert and oriented X3   Assessment/Plan:  Principal Problem:   Acute-on-chronic kidney injury ([place]) Active Problems:   Diabetes mellitus type 2 in obese ([facility])   Essential hypertension   Diabetic gastroparesis ([facility])   Human immunodeficiency virus (HIV) disease ([facility])   CKD (chronic kidney disease) stage 3, GFR 30-59 ml/min   Hypokalemia  Kathy Frank is a 41 y.o. female with PMH of HIV, CKD and t2DM with gastroparesis who presents with N/V and inability to tolerate oral intake.  Nausea / Vomitting 2/2 Diabetic Gastroparesis vs Mild Gastroenteritis: Symptoms are improving with Metoclopramide, antiemetics, and IVF hydration. She was able to tolerate liquids this morning. - Continue Metoclopramide q6h PRN - Continue NS @ 100cc/hr x 10 hr + 57mEq K - Advance diet as tolerated - Monitor diabetic control  AKI on CKD w/ hypokalemia: SCr 2.43 from b/l around 2.0. Likely 2/2 dehydration in the setting of N/V. Will continue fluids with potassium. -  IVF as above - recheck BMP in AM  DM w/ Hyperglycemia: Serum BG 378 on admission, no ketones in urine, no AG. Home meds: Lantus 15U qHS + Metformin 100mg  qD. - Increase Lantus to 15 U qHS now that she is taking PO - SSI-s qAC/HS - hold metformin for AKI  HTN: BP elevated on presentation to 140s/100s. Will continue home meds: Amlodipine 5mg , but hold lisinopril for AKI. Give hydral IV PRN BP >180/110. She is not on a higher dose Amlodipine due to history of peripheral edema with 10 mg dosing. - Continue Amlodipine 5 mg daily - Can give Hydralazine as needed for BP >180/110  HIV: Currently on Descovy and Tivicay, follows w/ [facility]. CD4 was 430 w/ undetectable HIV on 10/07/16. Continue home meds.   Dispo: Anticipated discharge in approximately 1 day(s).   Zada Finders, MD 11/15/2016, 11:41 AM

## 2016-11-16 ENCOUNTER — Telehealth: Payer: Self-pay

## 2016-11-16 DIAGNOSIS — E1143 Type 2 diabetes mellitus with diabetic autonomic (poly)neuropathy: Secondary | ICD-10-CM

## 2016-11-16 DIAGNOSIS — K3184 Gastroparesis: Secondary | ICD-10-CM

## 2016-11-16 DIAGNOSIS — N183 Chronic kidney disease, stage 3 (moderate): Secondary | ICD-10-CM

## 2016-11-16 DIAGNOSIS — E669 Obesity, unspecified: Secondary | ICD-10-CM

## 2016-11-16 LAB — GLUCOSE, CAPILLARY
Glucose-Capillary: 206 mg/dL — ABNORMAL HIGH (ref 65–99)
Glucose-Capillary: 95 mg/dL (ref 65–99)

## 2016-11-16 LAB — BASIC METABOLIC PANEL
Anion gap: 8 (ref 5–15)
BUN: 20 mg/dL (ref 6–20)
CO2: 21 mmol/L — ABNORMAL LOW (ref 22–32)
Calcium: 9 mg/dL (ref 8.9–10.3)
Chloride: 106 mmol/L (ref 101–111)
Creatinine, Ser: 2.43 mg/dL — ABNORMAL HIGH (ref 0.44–1.00)
GFR calc Af Amer: 28 mL/min — ABNORMAL LOW (ref >60)
GFR calc non Af Amer: 24 mL/min — ABNORMAL LOW (ref >60)
Glucose, Bld: 113 mg/dL — ABNORMAL HIGH (ref 65–99)
Potassium: 4 mmol/L (ref 3.5–5.1)
Sodium: 135 mmol/L (ref 135–145)

## 2016-11-16 MED ORDER — ONDANSETRON HCL 4 MG PO TABS: 4.0000 mg | ORAL_TABLET | Freq: Three times a day (TID) | ORAL | 0 refills | Status: DC | PRN

## 2016-11-16 MED ORDER — METOCLOPRAMIDE HCL 5 MG/5ML PO SOLN
10.0000 mg | Freq: Three times a day (TID) | ORAL | Status: DC
  Administered 2016-11-16 (×2): 10 mg via ORAL
  Filled 2016-11-16 (×2): qty 10

## 2016-11-16 MED ORDER — METOCLOPRAMIDE HCL 10 MG PO TABS: 10.0000 mg | ORAL_TABLET | Freq: Three times a day (TID) | ORAL | 0 refills | Status: DC

## 2016-11-16 MED ORDER — METOCLOPRAMIDE HCL 5 MG/ML IJ SOLN: 10.0000 mg | Freq: Three times a day (TID) | INTRAMUSCULAR | Status: DC

## 2016-11-16 NOTE — Progress Notes (Signed)
   Subjective: Patient was evaluated this morning. She states that she has not had any episodes of vomiting overnight and has been able to tolerate liquids.  She had her diet advanced this morning and had a boiled egg and grits without any nausea or vomiting.   Objective:  Vital signs in last 24 hours: Vitals:   11/15/16 0655 11/15/16 1345 11/15/16 2024 11/16/16 0627  BP: (!) 162/106 (!) 134/91 (!) 145/80 (!) 161/90  Pulse:  88 94 93  Resp:   18 18  Temp:  98.8 F (37.1 C) 98.1 F (36.7 C) 98.4 F (36.9 C)  TempSrc:  Oral Oral Oral  SpO2:  100% 98% 99%  Weight:      Height:       Physical Exam  Constitutional: She is well-developed, well-nourished, and in no distress.  Cardiovascular: Normal rate, regular rhythm and normal heart sounds.  Exam reveals no gallop and no friction rub.   No murmur heard. Pulmonary/Chest: Effort normal and breath sounds normal. No respiratory distress. She has no wheezes. She has no rales.  Abdominal: Soft. She exhibits no distension and no mass. There is no tenderness. There is no rebound and no guarding.  Musculoskeletal: She exhibits no edema.  Skin: Skin is warm and dry.    Assessment/Plan:  Principal Problem:   Acute-on-chronic kidney injury ([place]) Active Problems:   Diabetes mellitus type 2 in obese [facility])   Essential hypertension   Diabetic gastroparesis (HCC)   Human immunodeficiency virus (HIV) disease (HCC)   CKD (chronic kidney disease) stage 3, GFR 30-59 ml/min   Hypokalemia  Nausea / Vomitting 2/2 Diabetic Gastroparesis vs Mild Gastroenteritis Symptoms are improving with Metoclopramide, antiemetics, and IVF hydration. She is able to tolerate solids this morning.  No tenderness on physical exam. - Continue Metoclopramide q6h PRN - Advance diet as tolerated - GI cocktail prn   AKI on CKD w/ hypokalemia Likely 2/2 dehydration in the setting of N/V.  K 4.0 and creatinine stable.  - repleat electrolytes as necessary - encouraged  oral intake as tolerable   DM w/ Hyperglycemia Serum BG 378 on admission, no ketones in urine, no AG. Home meds: Lantus 15U qHS + Metformin 100mg  qD. - Lantus to 15 U qHS  - SSI-s qAC/HS - hold metformin for AKI  HTN BP elevated on presentation to 140s/100s. Will continue home meds: Amlodipine 5mg , but holding lisinopril in setting of AKI. Give hydral IV PRN BP >180/110. She is not on a higher dose Amlodipine due to history of peripheral edema with 10 mg dosing. - Continue Amlodipine 5 mg daily - Can give Hydralazine as needed for BP >180/110  HIV Currently on Descovy and Tivicay, follows w/ [facility]. CD4 was 430 w/ undetectable HIV on 10/07/16.  -Continue home meds.  Dispo: Anticipated discharge pending clinical improvement, likely tomorrow but possibly today.  Valinda Party, DO 11/16/2016, 8:55 AM Pager: [phone]

## 2016-11-16 NOTE — Care Management Note (Signed)
Case Management Note  Patient Details  Name: Kathy Frank MRN: [id number] Date of Birth: [birth year]  Subjective/Objective:                    Action/Plan:   Expected Discharge Date:  11/16/16               Expected Discharge Plan:  Home/Self Care  In-House Referral:     Discharge planning Services     Post Acute Care Choice:    Choice offered to:     DME Arranged:    DME Agency:     HH Arranged:    [place] Agency:     Status of Service:  Completed, signed off  If discussed at [employer] of Stay Meetings, dates discussed:    Additional Comments:  Marilu Favre, RN 11/16/2016, 3:03 PM

## 2016-11-16 NOTE — Progress Notes (Signed)
Verbally understood DC instructions, no questions asked.

## 2016-11-16 NOTE — Progress Notes (Signed)
   11/16/16 1023  Clinical Encounter Type  Visited With Patient  Visit Type Initial  Referral From Nurse (routine)  Consult/Referral To Chaplain  Recommendations folllow up if needed  Spiritual Encounters  Spiritual Needs Literature;Brochure  Stress Factors  Patient Stress Factors None identified  Advance Directives (For Healthcare)  Does Patient Have a Medical Advance Directive? No  Pt is asleep, called her name twice.  Follow up with Unit Chaplain, as needed.  Chaplain Latrail Pounders A. Jorgeluis Gurganus, MA-PC ,BA-REL/PHIL, [phone]

## 2016-11-16 NOTE — Telephone Encounter (Signed)
Hospital TOC per Dr Sherrye Payor, discharge 11/16/2016. appt 11/30/2016.

## 2016-11-16 NOTE — Discharge Summary (Signed)
 Name: Kathy Frank MRN: [id number] DOB: 11/03/1975 41 y.o. PCP: Rushil Patel V, MD  Date of Admission: 11/14/2016  5:09 PM Date of Discharge: 11/16/2016 Attending Physician: James M Granfortuna, MD  Discharge Diagnosis: 1. Acute on chronic kidney injury 2. Diabetic gastroparesis  Principal Problem:   Acute-on-chronic kidney injury (HCC) Active Problems:   Diabetes mellitus type 2 in obese (HCC)   Essential hypertension   Diabetic gastroparesis (HCC)   Human immunodeficiency virus (HIV) disease (HCC)   CKD (chronic kidney disease) stage 3, GFR 30-59 ml/min   Hypokalemia   Discharge Medications: Allergies as of 11/16/2016   No Known Allergies     Medication List    STOP taking these medications   lisinopril 20 MG tablet Commonly known as:  PRINIVIL,ZESTRIL     TAKE these medications   ACCU-CHEK FASTCLIX LANCETS Misc Check blood sugar 3 times a day   ACCU-CHEK GUIDE w/Device Kit 1 each by Does not apply route 3 (three) times daily.   amLODipine 5 MG tablet Commonly known as:  NORVASC Take 1 tablet (5 mg total) by mouth daily.   dolutegravir 50 MG tablet Commonly known as:  TIVICAY Take 1 tablet (50 mg total) by mouth daily.   DULoxetine HCl 40 MG Cpep Take 40 mg by mouth daily.   emtricitabine-tenofovir AF 200-25 MG tablet Commonly known as:  DESCOVY Take 1 tablet by mouth daily.   feeding supplement Liqd Take 1 Container by mouth 3 (three) times daily between meals.   glucose blood test strip Commonly known as:  ACCU-CHEK GUIDE Use to check blood sugars 3 times a day. Code: E11.69.   Insulin Glargine 100 UNIT/ML Solostar Pen Commonly known as:  LANTUS Inject 15 Units into the skin daily at 10 pm. What changed:  when to take this   Insulin Pen Needle 32G X 4 MM Misc 1 Units by Does not apply route daily.   metFORMIN 1000 MG tablet Commonly known as:  GLUCOPHAGE Take 1 tablet (1,000 mg total) by mouth daily with breakfast.   metoCLOPramide 10 MG  tablet Commonly known as:  REGLAN Take 1 tablet (10 mg total) by mouth 3 (three) times daily before meals.   ondansetron 4 MG tablet Commonly known as:  ZOFRAN Take 1 tablet (4 mg total) by mouth every 6 (six) hours as needed for nausea or vomiting. What changed:  Another medication with the same name was added. Make sure you understand how and when to take each.   ondansetron 4 MG tablet Commonly known as:  ZOFRAN Take 1 tablet (4 mg total) by mouth every 8 (eight) hours as needed for nausea or vomiting. What changed:  You were already taking a medication with the same name, and this prescription was added. Make sure you understand how and when to take each.   pantoprazole 40 MG tablet Commonly known as:  PROTONIX Take 1 tablet (40 mg total) by mouth daily.   sodium bicarbonate 650 MG tablet Take 1 tablet (650 mg total) by mouth 2 (two) times daily.   traZODone 50 MG tablet Commonly known as:  DESYREL Take 2 tablets (100 mg total) by mouth at bedtime.   triamcinolone cream 0.1 % Commonly known as:  KENALOG Apply 1 application topically 2 (two) times daily.       Disposition and follow-up:   [name] was discharged from [facility] in good condition.  At the hospital follow up visit please address:  1.  Assess   symptoms of nausea and vomiting. Patient was discharged with 2 weeks of metoclopramide.  Assess benefit. 2.  Assess blood pressure.  Patient is on amlodipine 5mg and lisinopril 20mg.  Patient's lisinopril was held on admission and discharge due to AKI.  Please restart lisinopril if needed.    3.  Labs / imaging needed at time of follow-up: BMET, creatinine   4.  Pending labs/ test needing follow-up: none  Follow-up Appointments: Follow-up Information    [facility]. Go on 11/30/2016.   Why:  Appointment at 10:15 am. Contact information: [address] [place] [address] [phone]           Hospital Course by problem list: Principal Problem:   Acute-on-chronic kidney injury (HCC) Active Problems:   Diabetes mellitus type 2 in obese (HCC)   Essential hypertension   Diabetic gastroparesis (HCC)   Human immunodeficiency virus (HIV) disease (HCC)   CKD (chronic kidney disease) stage 3, GFR 30-59 ml/min   Hypokalemia   Nausea / Vomitting 2/2 Diabetic Gastroparesis vs Mild Gastroenteritis Patient was admitted with a three day history of nausea and vomiting.  On admission patient was afebrile with no leukocytosis.  Symptoms improved with Metoclopramide, antiemetics, and IVF hydration. On day of discharge patient was able to tolerate solids and fluids.  Patient no longer complained of abdominal pain and abdominal tenderness was not appreciated on exam. She was discharged on metoclopramide with meals for 2 weeks. As well as zofran as needed.  AKI on CKD w/ hypokalemia Patient's creatinine on admission was 2.89 and on discharge was 2.4 it appears that patient's baseline is around 2. Likely secondary to dehydration in the setting of nausea and vomiting and lisinopril use.   DM w/ Hyperglycemia Serum BG 378 on admission, no ketones in urine, no anion gap. Home meds included Lantus 15U qHS + Metformin 100mg qD.  Home dose of Lantus was continued along with a sliding scale insulin. Metformin was held.  Blood sugars were between 100 -200 during her stay.  HTN BP elevated on presentation. Home Amlodipine 5mg was continued. Lisinopril was held in the setting of AKI. Blood pressures were labile.  HIV Currently on Descovy and Tivicay, follows w/ [facility]. CD4 was 430 w/ undetectable HIV on 10/07/16. Home meds were continued.  Discharge Vitals:   BP 126/76 (BP Location: Left Arm)   Pulse 97   Temp 97.5 F (36.4 C) (Oral)   Resp 18   Ht 5' 5" (1.651 m)   Wt 173 lb (78.5 kg)   SpO2 100%   BMI 28.79 kg/m    Discharge Instructions: Discharge Instructions    Diet - low sodium heart  healthy    Complete by:  As directed    Discharge instructions    Complete by:  As directed    [name]  Please take reglan before meals (3 times a day) for 2 weeks Please take zofran as needed for nausea and vomiting Please follow up in the internal medicine clinic in 2 weeks.  An appointment has been made. Please hold your lisinopril for now and re-check your blood pressure at follow up thanks   Increase activity slowly    Complete by:  As directed       Signed:  [place] , DO 11/20/2016, 2:23 PM   Pager: [phone]  

## 2016-11-20 ENCOUNTER — Encounter (HOSPITAL_COMMUNITY): Payer: Self-pay | Admitting: *Deleted

## 2016-11-20 ENCOUNTER — Emergency Department (HOSPITAL_COMMUNITY)
Admission: EM | Admit: 2016-11-20 | Discharge: 2016-11-21 | Disposition: A | Discharge status: Home / Self Care | Payer: Medicaid Other | Attending: Emergency Medicine | Admitting: Emergency Medicine

## 2016-11-20 DIAGNOSIS — E1122 Type 2 diabetes mellitus with diabetic chronic kidney disease: Secondary | ICD-10-CM | POA: Insufficient documentation

## 2016-11-20 DIAGNOSIS — Z87891 Personal history of nicotine dependence: Secondary | ICD-10-CM | POA: Diagnosis not present

## 2016-11-20 DIAGNOSIS — E1143 Type 2 diabetes mellitus with diabetic autonomic (poly)neuropathy: Secondary | ICD-10-CM | POA: Diagnosis not present

## 2016-11-20 DIAGNOSIS — E11319 Type 2 diabetes mellitus with unspecified diabetic retinopathy without macular edema: Secondary | ICD-10-CM | POA: Diagnosis not present

## 2016-11-20 DIAGNOSIS — R109 Unspecified abdominal pain: Secondary | ICD-10-CM | POA: Diagnosis present

## 2016-11-20 DIAGNOSIS — N183 Chronic kidney disease, stage 3 (moderate): Secondary | ICD-10-CM | POA: Diagnosis not present

## 2016-11-20 DIAGNOSIS — Z794 Long term (current) use of insulin: Secondary | ICD-10-CM | POA: Insufficient documentation

## 2016-11-20 DIAGNOSIS — I129 Hypertensive chronic kidney disease with stage 1 through stage 4 chronic kidney disease, or unspecified chronic kidney disease: Secondary | ICD-10-CM | POA: Diagnosis not present

## 2016-11-20 DIAGNOSIS — K3184 Gastroparesis: Secondary | ICD-10-CM

## 2016-11-20 LAB — URINALYSIS, ROUTINE W REFLEX MICROSCOPIC
Bacteria, UA: NONE SEEN
Bilirubin Urine: NEGATIVE
Glucose, UA: >=500 mg/dL — AB
Hgb urine dipstick: NEGATIVE
Ketones, ur: 5 mg/dL — AB
Leukocytes, UA: NEGATIVE
Nitrite: NEGATIVE
Protein, ur: >=300 mg/dL — AB
Specific Gravity, Urine: 1.012 (ref 1.005–1.030)
pH: 8 (ref 5.0–8.0)

## 2016-11-20 LAB — COMPREHENSIVE METABOLIC PANEL
ALT: 14 U/L (ref 14–54)
AST: 24 U/L (ref 15–41)
Albumin: 3.3 g/dL — ABNORMAL LOW (ref 3.5–5.0)
Alkaline Phosphatase: 84 U/L (ref 38–126)
Anion gap: 13 (ref 5–15)
BUN: 28 mg/dL — ABNORMAL HIGH (ref 6–20)
CO2: 27 mmol/L (ref 22–32)
Calcium: 9.4 mg/dL (ref 8.9–10.3)
Chloride: 94 mmol/L — ABNORMAL LOW (ref 101–111)
Creatinine, Ser: 2.73 mg/dL — ABNORMAL HIGH (ref 0.44–1.00)
GFR calc Af Amer: 24 mL/min — ABNORMAL LOW (ref >60)
GFR calc non Af Amer: 21 mL/min — ABNORMAL LOW (ref >60)
Glucose, Bld: 429 mg/dL — ABNORMAL HIGH (ref 65–99)
Potassium: 4.7 mmol/L (ref 3.5–5.1)
Sodium: 134 mmol/L — ABNORMAL LOW (ref 135–145)
Total Bilirubin: 0.6 mg/dL (ref 0.3–1.2)
Total Protein: 7.5 g/dL (ref 6.5–8.1)

## 2016-11-20 LAB — CBC
HCT: 31.5 % — ABNORMAL LOW (ref 36.0–46.0)
Hemoglobin: 10.6 g/dL — ABNORMAL LOW (ref 12.0–15.0)
MCH: 30.4 pg (ref 26.0–34.0)
MCHC: 33.7 g/dL (ref 30.0–36.0)
MCV: 90.3 fL (ref 78.0–100.0)
Platelets: 413 10*3/uL — ABNORMAL HIGH (ref 150–400)
RBC: 3.49 MIL/uL — ABNORMAL LOW (ref 3.87–5.11)
RDW: 13.5 % (ref 11.5–15.5)
WBC: 11.3 10*3/uL — ABNORMAL HIGH (ref 4.0–10.5)

## 2016-11-20 LAB — I-STAT BETA HCG BLOOD, ED (MC, WL, AP ONLY): I-stat hCG, quantitative: 8.2 m[IU]/mL — ABNORMAL HIGH (ref <5)

## 2016-11-20 LAB — CBG MONITORING, ED
Glucose-Capillary: 434 mg/dL — ABNORMAL HIGH (ref 65–99)
Glucose-Capillary: 122 mg/dL — ABNORMAL HIGH (ref 65–99)

## 2016-11-20 LAB — LIPASE, BLOOD: Lipase: 48 U/L (ref 11–51)

## 2016-11-20 LAB — PREGNANCY, URINE: Preg Test, Ur: NEGATIVE

## 2016-11-20 MED ORDER — HYDROMORPHONE HCL 1 MG/ML IJ SOLN
1.0000 mg | Freq: Once | INTRAMUSCULAR | Status: AC
  Administered 2016-11-20: 1 mg via INTRAVENOUS
  Filled 2016-11-20: qty 1

## 2016-11-20 MED ORDER — ONDANSETRON 4 MG PO TBDP
ORAL_TABLET | ORAL | Status: AC
  Filled 2016-11-20: qty 1

## 2016-11-20 MED ORDER — METOCLOPRAMIDE HCL 5 MG/ML IJ SOLN
10.0000 mg | Freq: Once | INTRAMUSCULAR | Status: AC
  Administered 2016-11-20: 10 mg via INTRAVENOUS
  Filled 2016-11-20: qty 2

## 2016-11-20 MED ORDER — IOPAMIDOL (ISOVUE-300) INJECTION 61%
INTRAVENOUS | Status: AC
  Filled 2016-11-20: qty 30

## 2016-11-20 MED ORDER — ONDANSETRON 4 MG PO TBDP
4.0000 mg | ORAL_TABLET | Freq: Once | ORAL | Status: AC
  Administered 2016-11-20: 4 mg via ORAL

## 2016-11-20 MED ORDER — SODIUM CHLORIDE 0.9 % IV BOLUS (SEPSIS)
1000.0000 mL | Freq: Once | INTRAVENOUS | Status: AC
  Administered 2016-11-20: 1000 mL via INTRAVENOUS

## 2016-11-20 MED ORDER — INSULIN ASPART 100 UNIT/ML ~~LOC~~ SOLN
10.0000 [IU] | Freq: Once | SUBCUTANEOUS | Status: AC
  Administered 2016-11-20: 10 [IU] via SUBCUTANEOUS
  Filled 2016-11-20: qty 1

## 2016-11-20 NOTE — ED Triage Notes (Signed)
Pt in c/o abd pain onset today, pt filled 500 mL emesis while in triage, pt admitted to the hospital last for similar symptoms, pt c/o SOB, denies diarrhea, A&O x4

## 2016-11-20 NOTE — ED Notes (Signed)
Pt requests to have labs drawn once in room

## 2016-11-20 NOTE — ED Provider Notes (Signed)
[facility] DEPT Provider Note   CSN: [id number] Arrival date & time: 11/20/16  1350     History   Chief Complaint No chief complaint on file.   HPI Kathy Frank is a 41 y.o. female.  HPI   Pt is a 41 yo female with PMH of HTN, gastroparesis, cyclical vomiting syndrome, HIV, DM, CKD and GERD who presents to the ED with complaint of N/V and abdominal pain, onset 4am. Pt reports she was recently discharged from the hospital due to her gastroparesis and notes she was feeling fine until when she woke up at 4am today due to have constant sever sharp upper abdominal pain with multiple episodes of vomiting. She reports having a small amount of blood in her vomit in the ED. She reports trying to take PO reglan at home PTA but states she has been unable to keep anything down. Pt reports sxs feel similar to her previous episodes of gastroparesis/cyclical vomiting. Denies fever, chills, CP, SOB, cough, diarrhea, urinary sxs, vaginal bleeding/discharge. Denies hx of abdominal surgeries. Pt reports she has been taking all of her home meds as prescribed and notes her glucose has been below 160 at home.   Past Medical History:  Diagnosis Date  . CKD (chronic kidney disease), stage III   . Cyclical vomiting    THC induced???  . Depression   . DKA (diabetic ketoacidoses) ([place]) 12/2014  . Erosive esophagitis   . Gastroparesis    ? diabetic  . GERD (gastroesophageal reflux disease)   . Human immunodeficiency virus (HIV) disease ([place]) dx'd 04/2016  . Hypertension   . Normocytic anemia   . Type II diabetes mellitus ([place]) 1994   diagnosed around 1994    Patient Active Problem List   Diagnosis Date Noted  . Acute-on-chronic kidney injury ([place]) 11/14/2016  . Aortic atherosclerosis ([place]) 09/30/2016  . Orthostatic hypotension 09/30/2016  . Hypokalemia 09/30/2016  . Diabetic foot ulcer associated with diabetes mellitus due to underlying condition ([place]) 07/08/2016  . CKD (chronic kidney disease)  stage 3, GFR 30-59 ml/min 06/19/2016  . Human immunodeficiency virus (HIV) disease ([place]) 04/23/2016  . Contact dermatitis 04/21/2016  . Diabetic gastroparesis ([place])   . Moderate nonproliferative diabetic retinopathy of both eyes ([place]) 11/21/2014  . Esophageal reflux   . Microscopic hematuria 10/31/2014  . Essential hypertension 11/16/2013  . Diabetic nephropathy ([place]) 12/07/2012  . TOBACCO USER 06/28/2006  . Depression 06/28/2006  . Diabetes mellitus type 2 in obese [facility]) 06/28/1994    Past Surgical History:  Procedure Laterality Date  . TUBAL LIGATION  2002    OB History    No data available       Home Medications    Prior to Admission medications   Medication Sig Start Date End Date Taking? Authorizing Provider  ACCU-CHEK FASTCLIX LANCETS MISC Check blood sugar 3 times a day 09/09/16   Riccardo Dubin, MD  amLODipine (NORVASC) 5 MG tablet Take 1 tablet (5 mg total) by mouth daily. 10/06/16   Milagros Loll, MD  Blood Glucose Monitoring Suppl (ACCU-CHEK GUIDE) w/Device KIT 1 each by Does not apply route 3 (three) times daily. 09/09/16   Rushil Sherrye Payor, MD  dolutegravir (TIVICAY) 50 MG tablet Take 1 tablet (50 mg total) by mouth daily. 07/08/16   Campbell Riches, MD  DULoxetine HCl 40 MG CPEP Take 40 mg by mouth daily. 10/06/16   Milagros Loll, MD  emtricitabine-tenofovir AF (DESCOVY) 200-25 MG tablet Take 1 tablet by mouth daily.  07/08/16   Campbell Riches, MD  feeding supplement (BOOST / RESOURCE BREEZE) LIQD Take 1 Container by mouth 3 (three) times daily between meals. 10/01/16   Lorella Nimrod, MD  glucose blood (ACCU-CHEK GUIDE) test strip Use to check blood sugars 3 times a day. Code: E11.69. Patient not taking: Reported on 09/30/2016 06/22/16   Riccardo Dubin, MD  Insulin Glargine (LANTUS) 100 UNIT/ML Solostar Pen Inject 15 Units into the skin daily at 10 pm. Patient taking differently: Inject 15 Units into the skin daily.  05/22/16   Rushil Sherrye Payor, MD  Insulin Pen  Needle 32G X 4 MM MISC 1 Units by Does not apply route daily. 11/14/14   Lucious Groves, DO  metFORMIN (GLUCOPHAGE) 1000 MG tablet Take 1 tablet (1,000 mg total) by mouth daily with breakfast. 05/22/16   Riccardo Dubin, MD  metoCLOPramide (REGLAN) 10 MG tablet Take 1 tablet (10 mg total) by mouth 3 (three) times daily before meals. 11/16/16   Jessica Ratliff Hoffman, DO  ondansetron (ZOFRAN) 4 MG tablet Take 1 tablet (4 mg total) by mouth every 6 (six) hours as needed for nausea or vomiting. 09/29/16   Alexa Angela Burke, MD  ondansetron (ZOFRAN) 4 MG tablet Take 1 tablet (4 mg total) by mouth every 8 (eight) hours as needed for nausea or vomiting. 11/16/16   Jessica Ratliff Hoffman, DO  pantoprazole (PROTONIX) 40 MG tablet Take 1 tablet (40 mg total) by mouth daily. 09/29/16   Florinda Marker, MD  promethazine (PHENERGAN) 25 MG suppository Place 1 suppository (25 mg total) rectally every 6 (six) hours as needed for nausea or vomiting. 11/21/16   Nona Dell, PA-C  sodium bicarbonate 650 MG tablet Take 1 tablet (650 mg total) by mouth 2 (two) times daily. 06/19/16 06/19/17  Shela Leff, MD  traZODone (DESYREL) 50 MG tablet Take 2 tablets (100 mg total) by mouth at bedtime. 10/06/16   Milagros Loll, MD  triamcinolone cream (KENALOG) 0.1 % Apply 1 application topically 2 (two) times daily. 03/27/16   Janne Napoleon, NP    Family History Family History  Problem Relation Age of Onset  . Diabetes Mother   . Diabetes Brother   . Diabetes Daughter   . Diabetes Daughter   . Mental retardation Brother     died from PNA    Social History Social History  Substance Use Topics  . Smoking status: Former Smoker    Packs/day: 0.10    Years: 10.00    Types: Cigarettes    Start date: 11/17/2013    Quit date: 09/09/2014  . Smokeless tobacco: Never Used  . Alcohol use Yes     Comment: 09/30/2016  "maybe birthdays or holidays; very occasional"     Allergies   Patient has no known allergies.   Review  of Systems Review of Systems  Gastrointestinal: Positive for abdominal pain, nausea and vomiting.  All other systems reviewed and are negative.    Physical Exam Updated Vital Signs BP (!) 178/91   Pulse (!) 105   Temp 98.1 F (36.7 C) (Oral)   Resp 18   SpO2 100%   Physical Exam  Constitutional: She is oriented to person, place, and time. She appears well-developed and well-nourished. No distress.  HENT:  Head: Normocephalic and atraumatic.  Mouth/Throat: Uvula is midline, oropharynx is clear and moist and mucous membranes are normal. No oropharyngeal exudate, posterior oropharyngeal edema, posterior oropharyngeal erythema or tonsillar abscesses. No tonsillar exudate.  Eyes: Conjunctivae  and EOM are normal. Right eye exhibits no discharge. Left eye exhibits no discharge. No scleral icterus.  Neck: Normal range of motion. Neck supple.  Cardiovascular: Regular rhythm, normal heart sounds and intact distal pulses.   HR 104  Pulmonary/Chest: Effort normal and breath sounds normal. No respiratory distress. She has no wheezes. She has no rales. She exhibits no tenderness.  Abdominal: Soft. Bowel sounds are normal. She exhibits no distension and no mass. There is tenderness. There is no rebound and no guarding. No hernia.  TTP over upper abdominal quadrants, no peritoneal signs. No CVA tenderness.   Musculoskeletal: Normal range of motion. She exhibits no edema.  Neurological: She is alert and oriented to person, place, and time.  Skin: Skin is warm and dry. She is not diaphoretic.  Nursing note and vitals reviewed.    ED Treatments / Results  Labs (all labs ordered are listed, but only abnormal results are displayed) Labs Reviewed  COMPREHENSIVE METABOLIC PANEL - Abnormal; Notable for the following:       Result Value   Sodium 134 (*)    Chloride 94 (*)    Glucose, Bld 429 (*)    BUN 28 (*)    Creatinine, Ser 2.73 (*)    Albumin 3.3 (*)    GFR calc non Af Amer 21 (*)    GFR  calc Af Amer 24 (*)    All other components within normal limits  CBC - Abnormal; Notable for the following:    WBC 11.3 (*)    RBC 3.49 (*)    Hemoglobin 10.6 (*)    HCT 31.5 (*)    Platelets 413 (*)    All other components within normal limits  URINALYSIS, ROUTINE W REFLEX MICROSCOPIC - Abnormal; Notable for the following:    Color, Urine STRAW (*)    Glucose, UA >=500 (*)    Ketones, ur 5 (*)    Protein, ur >=300 (*)    Squamous Epithelial / LPF 0-5 (*)    All other components within normal limits  I-STAT BETA HCG BLOOD, ED ([facility], [facility], AP ONLY) - Abnormal; Notable for the following:    I-stat hCG, quantitative 8.2 (*)    All other components within normal limits  CBG MONITORING, ED - Abnormal; Notable for the following:    Glucose-Capillary 434 (*)    All other components within normal limits  CBG MONITORING, ED - Abnormal; Notable for the following:    Glucose-Capillary 122 (*)    All other components within normal limits  LIPASE, BLOOD  PREGNANCY, URINE  POC OCCULT BLOOD, ED    EKG  EKG Interpretation None       Radiology Ct Abdomen Pelvis Wo Contrast  Result Date: 11/21/2016 CLINICAL DATA:  Abdominal pain. History of gastroparesis and erosive esophagitis. EXAM: CT ABDOMEN AND PELVIS WITHOUT CONTRAST TECHNIQUE: Multidetector CT imaging of the abdomen and pelvis was performed following the standard protocol without IV contrast. COMPARISON:  CT abdomen pelvis 11/20/2015 FINDINGS: Lower chest: No pulmonary nodules or pleural effusion. No visible pericardial effusion. Hepatobiliary: Normal noncontrast appearance of the liver. No visible biliary dilatation. There is sludge or small stones within the gallbladder. No evidence of acute inflammation. Pancreas: Normal noncontrast appearance of the pancreas. No peripancreatic fluid collection. Spleen: Normal. Adrenal glands: Normal. Urinary Tract: --Right kidney/ureter: No hydronephrosis or perinephric stranding. No nephrolithiasis. No  obstructing ureteral stones. --Left kidney/ureter: No hydronephrosis or perinephric stranding. No nephrolithiasis. No obstructing ureteral stones. --Urinary bladder: Unremarkable. Stomach/Bowel: No  dilated loops of bowel. No evidence of colonic or enteric inflammation. No fluid collection within the abdomen. The appendix is not clearly visualized. Vascular/Lymphatic: There is atherosclerotic calcification of the non aneurysmal abdominal aorta. No abdominal or pelvic lymphadenopathy. Reproductive: Normal uterus.  No adnexal lesion. Musculoskeletal. No focal osseous lesion. Normal visualized extraperitoneal and extrathoracic soft tissues. IMPRESSION: 1. No acute abnormality of the abdomen or pelvis. 2. Aortic atherosclerosis. 3. Gallbladder sludge or stones, unchanged. Electronically Signed   By: Ulyses Jarred M.D.   On: 11/21/2016 00:42    Procedures Procedures (including critical care time)  Medications Ordered in ED Medications  ondansetron (ZOFRAN-ODT) 4 MG disintegrating tablet (not administered)  iopamidol (ISOVUE-300) 61 % injection (not administered)  promethazine (PHENERGAN) injection 12.5 mg (not administered)  ondansetron (ZOFRAN-ODT) disintegrating tablet 4 mg (4 mg Oral Given 11/20/16 1447)  sodium chloride 0.9 % bolus 1,000 mL (0 mLs Intravenous Stopped 11/20/16 2111)  metoCLOPramide (REGLAN) injection 10 mg (10 mg Intravenous Given 11/20/16 2000)  HYDROmorphone (DILAUDID) injection 1 mg (1 mg Intravenous Given 11/20/16 2000)  insulin aspart (novoLOG) injection 10 Units (10 Units Subcutaneous Given 11/20/16 2110)  metoCLOPramide (REGLAN) injection 10 mg (10 mg Intravenous Given 11/20/16 2346)     Initial Impression / Assessment and Plan / ED Course  I have reviewed the triage vital signs and the nursing notes.  Pertinent labs & imaging results that were available during my care of the patient were reviewed by me and considered in my medical decision making (see chart for details).      Patient presents with upper abdominal pain, nausea and vomiting consistent with gastroparesis flare she has had in the past. Patient states she was admitted on 4/7 for similar symptoms and discharged home on 49 with improvement of symptoms. Symptoms did not return until 4 AM this morning. Denies fever, hematemesis, blood in stool. Unable to tolerate PO at home. PMH of HTN, gastroparesis, cyclical vomiting syndrome, HIV, DM, CKD and GERD Vitals during my initial evaluation revealed afebrile, heart rate 106, normotensive. Exam revealed tenderness over upper abdominal quadrants, no peritoneal signs. Remaining exam unremarkable. Patient given IV fluids, pain meds and antiemetics.  Pregnancy negative. Mild leukocytosis, WBC 11.3. BUN 28, Cr 2.73 (chart review shows pt with Cr 2-2.9 at baseline). Glucose 429, no AG; pt given IVF and 10U insulin in the ED. on reevaluation patient reports improvement of pain but continues endorsing nausea and vomiting. Patient given second dose of antiemetics. Due to patient with recurrent symptoms after recent hospitalization for gastroparesis, will order CT abdomen for further evaluation. Repeat CBG 122.  CT abdomen negative. On reevaluation patient is resting comfortably in bed and reports improvement of symptoms. Patient able to tolerate PO. Suspect patient's symptoms are likely related to gastroparesis/cyclical vomiting syndrome. Discussed results and plan for discharge with patient. Plan to discharge patient home with prescription of suppository antiemetic and symptomatically treatment. Patient reports she has a follow-up appointment scheduled with her PCP for this week. Discussed return precautions.  Final Clinical Impressions(s) / ED Diagnoses   Final diagnoses:  Gastroparesis    New Prescriptions New Prescriptions   PROMETHAZINE (PHENERGAN) 25 MG SUPPOSITORY    Place 1 suppository (25 mg total) rectally every 6 (six) hours as needed for nausea or vomiting.      Chesley Noon Lebo, [place] 11/21/16 0104    Virgel Manifold, MD 11/21/16 1925

## 2016-11-20 NOTE — ED Notes (Signed)
Unable to assess BP at this time d/t pt moving, will have NT recheck

## 2016-11-21 ENCOUNTER — Emergency Department (HOSPITAL_COMMUNITY): Payer: Medicaid Other

## 2016-11-21 MED ORDER — PROMETHAZINE HCL 25 MG RE SUPP: 25.0000 mg | Freq: Four times a day (QID) | RECTAL | 0 refills | Status: DC | PRN

## 2016-11-21 MED ORDER — PROMETHAZINE HCL 25 MG/ML IJ SOLN
12.5000 mg | Freq: Once | INTRAMUSCULAR | Status: AC
  Administered 2016-11-21: 12.5 mg via INTRAVENOUS
  Filled 2016-11-21: qty 1

## 2016-11-21 NOTE — Discharge Instructions (Signed)
Continue taking your home nausea medication as prescribed as needed. Continue drinking fluids at home during hydrated. Continue monitoring her glucose levels at home and taking your home medications as prescribed. Follow-up with your primary care provider at your scheduled appointment next week for follow-up evaluation. Please return to the Emergency Department if symptoms worsen or new onset of fever, chest pain, difficulty breathing, new/worsening abdominal pain, vomiting blood, unable to keep fluids down, blood in stool.

## 2016-11-22 ENCOUNTER — Observation Stay (HOSPITAL_COMMUNITY): Payer: Medicaid Other

## 2016-11-22 ENCOUNTER — Encounter (HOSPITAL_COMMUNITY): Payer: Self-pay | Admitting: Emergency Medicine

## 2016-11-22 ENCOUNTER — Observation Stay (HOSPITAL_COMMUNITY)
Admission: EM | Admit: 2016-11-22 | Discharge: 2016-11-23 | Disposition: A | Discharge status: Home / Self Care | Payer: Medicaid Other | Attending: Internal Medicine | Admitting: Internal Medicine

## 2016-11-22 DIAGNOSIS — Z21 Asymptomatic human immunodeficiency virus [HIV] infection status: Secondary | ICD-10-CM | POA: Diagnosis not present

## 2016-11-22 DIAGNOSIS — N179 Acute kidney failure, unspecified: Secondary | ICD-10-CM

## 2016-11-22 DIAGNOSIS — F1290 Cannabis use, unspecified, uncomplicated: Secondary | ICD-10-CM

## 2016-11-22 DIAGNOSIS — N183 Chronic kidney disease, stage 3 (moderate): Secondary | ICD-10-CM | POA: Diagnosis not present

## 2016-11-22 DIAGNOSIS — Z87891 Personal history of nicotine dependence: Secondary | ICD-10-CM | POA: Diagnosis not present

## 2016-11-22 DIAGNOSIS — Z8719 Personal history of other diseases of the digestive system: Secondary | ICD-10-CM | POA: Diagnosis not present

## 2016-11-22 DIAGNOSIS — Z794 Long term (current) use of insulin: Secondary | ICD-10-CM | POA: Insufficient documentation

## 2016-11-22 DIAGNOSIS — N189 Chronic kidney disease, unspecified: Secondary | ICD-10-CM

## 2016-11-22 DIAGNOSIS — L818 Other specified disorders of pigmentation: Secondary | ICD-10-CM | POA: Diagnosis not present

## 2016-11-22 DIAGNOSIS — Z810 Family history of intellectual disabilities: Secondary | ICD-10-CM

## 2016-11-22 DIAGNOSIS — E1122 Type 2 diabetes mellitus with diabetic chronic kidney disease: Secondary | ICD-10-CM | POA: Diagnosis not present

## 2016-11-22 DIAGNOSIS — R112 Nausea with vomiting, unspecified: Secondary | ICD-10-CM | POA: Diagnosis not present

## 2016-11-22 DIAGNOSIS — E1165 Type 2 diabetes mellitus with hyperglycemia: Secondary | ICD-10-CM

## 2016-11-22 DIAGNOSIS — G43A0 Cyclical vomiting, not intractable: Secondary | ICD-10-CM | POA: Diagnosis not present

## 2016-11-22 DIAGNOSIS — I129 Hypertensive chronic kidney disease with stage 1 through stage 4 chronic kidney disease, or unspecified chronic kidney disease: Secondary | ICD-10-CM | POA: Diagnosis not present

## 2016-11-22 DIAGNOSIS — R1115 Cyclical vomiting syndrome unrelated to migraine: Secondary | ICD-10-CM

## 2016-11-22 DIAGNOSIS — R10817 Generalized abdominal tenderness: Secondary | ICD-10-CM | POA: Diagnosis not present

## 2016-11-22 DIAGNOSIS — Z833 Family history of diabetes mellitus: Secondary | ICD-10-CM

## 2016-11-22 DIAGNOSIS — Z79899 Other long term (current) drug therapy: Secondary | ICD-10-CM | POA: Insufficient documentation

## 2016-11-22 DIAGNOSIS — R1110 Vomiting, unspecified: Secondary | ICD-10-CM

## 2016-11-22 DIAGNOSIS — R1013 Epigastric pain: Secondary | ICD-10-CM | POA: Insufficient documentation

## 2016-11-22 LAB — CBC WITH DIFFERENTIAL/PLATELET
Basophils Absolute: 0 10*3/uL (ref 0.0–0.1)
Basophils Relative: 0 %
Eosinophils Absolute: 0.1 10*3/uL (ref 0.0–0.7)
Eosinophils Relative: 1 %
HCT: 34.4 % — ABNORMAL LOW (ref 36.0–46.0)
Hemoglobin: 11.6 g/dL — ABNORMAL LOW (ref 12.0–15.0)
Lymphocytes Relative: 19 %
Lymphs Abs: 1.4 10*3/uL (ref 0.7–4.0)
MCH: 30.9 pg (ref 26.0–34.0)
MCHC: 33.7 g/dL (ref 30.0–36.0)
MCV: 91.7 fL (ref 78.0–100.0)
Monocytes Absolute: 0.8 10*3/uL (ref 0.1–1.0)
Monocytes Relative: 11 %
Neutro Abs: 5.2 10*3/uL (ref 1.7–7.7)
Neutrophils Relative %: 69 %
Platelets: 396 10*3/uL (ref 150–400)
RBC: 3.75 MIL/uL — ABNORMAL LOW (ref 3.87–5.11)
RDW: 13.5 % (ref 11.5–15.5)
WBC: 7.5 10*3/uL (ref 4.0–10.5)

## 2016-11-22 LAB — COMPREHENSIVE METABOLIC PANEL
ALT: 15 U/L (ref 14–54)
AST: 21 U/L (ref 15–41)
Albumin: 3.1 g/dL — ABNORMAL LOW (ref 3.5–5.0)
Alkaline Phosphatase: 75 U/L (ref 38–126)
Anion gap: 10 (ref 5–15)
BUN: 27 mg/dL — ABNORMAL HIGH (ref 6–20)
CO2: 26 mmol/L (ref 22–32)
Calcium: 9 mg/dL (ref 8.9–10.3)
Chloride: 97 mmol/L — ABNORMAL LOW (ref 101–111)
Creatinine, Ser: 3.31 mg/dL — ABNORMAL HIGH (ref 0.44–1.00)
GFR calc Af Amer: 19 mL/min — ABNORMAL LOW (ref >60)
GFR calc non Af Amer: 16 mL/min — ABNORMAL LOW (ref >60)
Glucose, Bld: 325 mg/dL — ABNORMAL HIGH (ref 65–99)
Potassium: 3.7 mmol/L (ref 3.5–5.1)
Sodium: 133 mmol/L — ABNORMAL LOW (ref 135–145)
Total Bilirubin: 0.5 mg/dL (ref 0.3–1.2)
Total Protein: 7.6 g/dL (ref 6.5–8.1)

## 2016-11-22 LAB — URINALYSIS, ROUTINE W REFLEX MICROSCOPIC
Bilirubin Urine: NEGATIVE
Glucose, UA: 150 mg/dL — AB
Hgb urine dipstick: NEGATIVE
Ketones, ur: NEGATIVE mg/dL
Leukocytes, UA: NEGATIVE
Nitrite: NEGATIVE
Protein, ur: >=300 mg/dL — AB
Specific Gravity, Urine: 1.017 (ref 1.005–1.030)
pH: 7 (ref 5.0–8.0)

## 2016-11-22 LAB — LIPASE, BLOOD: Lipase: 37 U/L (ref 11–51)

## 2016-11-22 LAB — GLUCOSE, CAPILLARY: Glucose-Capillary: 198 mg/dL — ABNORMAL HIGH (ref 65–99)

## 2016-11-22 LAB — I-STAT BETA HCG BLOOD, ED (MC, WL, AP ONLY): I-stat hCG, quantitative: 7.2 m[IU]/mL — ABNORMAL HIGH (ref <5)

## 2016-11-22 LAB — PREGNANCY, URINE: Preg Test, Ur: NEGATIVE

## 2016-11-22 MED ORDER — ONDANSETRON HCL 4 MG PO TABS
4.0000 mg | ORAL_TABLET | Freq: Four times a day (QID) | ORAL | Status: DC | PRN
  Administered 2016-11-23: 4 mg via ORAL
  Filled 2016-11-22: qty 1

## 2016-11-22 MED ORDER — SODIUM CHLORIDE 0.9 % IV SOLN
INTRAVENOUS | Status: AC
  Administered 2016-11-23 (×2): via INTRAVENOUS

## 2016-11-22 MED ORDER — METOCLOPRAMIDE HCL 10 MG PO TABS
10.0000 mg | ORAL_TABLET | Freq: Three times a day (TID) | ORAL | Status: DC
  Administered 2016-11-23 (×2): 10 mg via ORAL
  Filled 2016-11-22 (×2): qty 1

## 2016-11-22 MED ORDER — MORPHINE SULFATE (PF) 4 MG/ML IV SOLN
4.0000 mg | Freq: Once | INTRAVENOUS | Status: AC
  Administered 2016-11-22: 4 mg via INTRAVENOUS
  Filled 2016-11-22: qty 1

## 2016-11-22 MED ORDER — ACETAMINOPHEN 650 MG RE SUPP: 650.0000 mg | Freq: Four times a day (QID) | RECTAL | Status: DC | PRN

## 2016-11-22 MED ORDER — ACETAMINOPHEN 325 MG PO TABS: 650.0000 mg | ORAL_TABLET | Freq: Four times a day (QID) | ORAL | Status: DC | PRN

## 2016-11-22 MED ORDER — EMTRICITABINE-TENOFOVIR AF 200-25 MG PO TABS
1.0000 | ORAL_TABLET | Freq: Every day | ORAL | Status: DC
  Administered 2016-11-23: 1 via ORAL
  Filled 2016-11-22: qty 1

## 2016-11-22 MED ORDER — PANTOPRAZOLE SODIUM 40 MG PO TBEC
40.0000 mg | DELAYED_RELEASE_TABLET | Freq: Every day | ORAL | Status: DC
  Administered 2016-11-23: 40 mg via ORAL
  Filled 2016-11-22: qty 1

## 2016-11-22 MED ORDER — INSULIN GLARGINE 100 UNIT/ML ~~LOC~~ SOLN
15.0000 [IU] | Freq: Every day | SUBCUTANEOUS | Status: DC
  Administered 2016-11-23: 15 [IU] via SUBCUTANEOUS
  Filled 2016-11-22 (×2): qty 0.15

## 2016-11-22 MED ORDER — DULOXETINE HCL 20 MG PO CPEP
40.0000 mg | ORAL_CAPSULE | Freq: Every day | ORAL | Status: DC
  Administered 2016-11-23: 40 mg via ORAL
  Filled 2016-11-22: qty 2

## 2016-11-22 MED ORDER — METOCLOPRAMIDE HCL 5 MG/ML IJ SOLN
10.0000 mg | Freq: Once | INTRAMUSCULAR | Status: AC
  Administered 2016-11-22: 10 mg via INTRAVENOUS
  Filled 2016-11-22: qty 2

## 2016-11-22 MED ORDER — ENOXAPARIN SODIUM 30 MG/0.3ML ~~LOC~~ SOLN
30.0000 mg | SUBCUTANEOUS | Status: DC
  Administered 2016-11-23: 30 mg via SUBCUTANEOUS
  Filled 2016-11-22: qty 0.3

## 2016-11-22 MED ORDER — SODIUM CHLORIDE 0.9 % IV BOLUS (SEPSIS)
1000.0000 mL | Freq: Once | INTRAVENOUS | Status: AC
  Administered 2016-11-22: 1000 mL via INTRAVENOUS

## 2016-11-22 MED ORDER — GI COCKTAIL ~~LOC~~: 30.0000 mL | Freq: Two times a day (BID) | ORAL | Status: DC | PRN

## 2016-11-22 MED ORDER — INSULIN ASPART 100 UNIT/ML ~~LOC~~ SOLN: 0.0000 [IU] | Freq: Every day | SUBCUTANEOUS | Status: DC

## 2016-11-22 MED ORDER — CAPSAICIN 0.025 % EX CREA
TOPICAL_CREAM | Freq: Once | CUTANEOUS | Status: AC
  Administered 2016-11-23: 01:00:00 via TOPICAL
  Filled 2016-11-22 (×2): qty 60

## 2016-11-22 MED ORDER — DOLUTEGRAVIR SODIUM 50 MG PO TABS
50.0000 mg | ORAL_TABLET | Freq: Every day | ORAL | Status: DC
  Administered 2016-11-23: 50 mg via ORAL
  Filled 2016-11-22: qty 1

## 2016-11-22 MED ORDER — SENNOSIDES-DOCUSATE SODIUM 8.6-50 MG PO TABS: 1.0000 | ORAL_TABLET | Freq: Every evening | ORAL | Status: DC | PRN

## 2016-11-22 MED ORDER — INSULIN ASPART 100 UNIT/ML ~~LOC~~ SOLN
0.0000 [IU] | Freq: Three times a day (TID) | SUBCUTANEOUS | Status: DC
  Administered 2016-11-23: 2 [IU] via SUBCUTANEOUS

## 2016-11-22 MED ORDER — AMLODIPINE BESYLATE 5 MG PO TABS
5.0000 mg | ORAL_TABLET | Freq: Every day | ORAL | Status: DC
  Administered 2016-11-23: 5 mg via ORAL
  Filled 2016-11-22: qty 1

## 2016-11-22 MED ORDER — ONDANSETRON HCL 4 MG/2ML IJ SOLN
4.0000 mg | Freq: Four times a day (QID) | INTRAMUSCULAR | Status: DC | PRN
  Administered 2016-11-23: 4 mg via INTRAVENOUS
  Filled 2016-11-22: qty 2

## 2016-11-22 NOTE — H&P (Signed)
Date: 11/22/2016               Patient Name:  Kathy Frank MRN: [id number]  DOB: May 13, 1976 Age / Sex: 41 y.o., female   PCP: Riccardo Dubin, MD         Medical Service: Internal Medicine Teaching Service         Attending Physician: Dr. Aldine Contes, MD    First Contact: Dr. Kalman Shan Pager: [phone]  Second Contact: Dr. Zada Finders Pager: [phone]       After Hours (After 5p/  First Contact Pager: [phone]  weekends / holidays): Second Contact Pager: [phone]   Chief Complaint: Nausea and vomiting  History of Present Illness: Pt is a 41 y.o. Female with h/o HIV, CKD, HTN, and t2DM who presets with N/V.   Pt has a long Hx of gastroparesis with multiple admission for N/V. Most recently admitted on 11/14/2016 for N/V 2/2 gastroparesis vs. Gastroenteritis.  Also presented to the emergency department yesterday for similar complaints with normal CT A/P. Patient states she has not felt well since her prior admission. She has had multiple days where she had nausea and vomiting however this worsened approximately 3 days ago. She has only been able to take her Reglan once a day for the prior 3 days as she is unable to keep this medicine down. She's had too numerous to count episodes of nonbloody nonbilious emesis in the interval. She has no fevers. She does endorse chills and chest pain after emesis. She also has diffuse abdominal pain that is not focal per history or on examination. She also complains of constipation and says she has not had a bowel movement since Thursday and does not feel like she has passed gas in several days. She denies sick contacts. She denies shortness of breath or headache. She denies diarrhea, polyuria or dysuria.   In the emergency department the patient was hypertensive with a blood pressure 124/92 and tachycardic with a pulse of 103. Patient was afebrile. Respirations 22 and satting 100% on room air. CBC with hemoglobin 11.6. No leukocytosis. CMP showing  sodium of 133, glucose of 325, creatinine 3.31. Lipase normal. Urinalysis positive for glucose and protein. No evidence of infection. Patient was admitted for symptom management, acute kidney injury and ongoing management.  Meds:  Current Meds  Medication Sig  . amLODipine (NORVASC) 5 MG tablet Take 1 tablet (5 mg total) by mouth daily.  . dolutegravir (TIVICAY) 50 MG tablet Take 1 tablet (50 mg total) by mouth daily.  . DULoxetine HCl 40 MG CPEP Take 40 mg by mouth daily.  Marland Kitchen emtricitabine-tenofovir AF (DESCOVY) 200-25 MG tablet Take 1 tablet by mouth daily.  . Insulin Glargine (LANTUS) 100 UNIT/ML Solostar Pen Inject 15 Units into the skin daily at 10 pm. (Patient taking differently: Inject 15 Units into the skin daily. )  . metFORMIN (GLUCOPHAGE) 1000 MG tablet Take 1 tablet (1,000 mg total) by mouth daily with breakfast.  . metoCLOPramide (REGLAN) 10 MG tablet Take 1 tablet (10 mg total) by mouth 3 (three) times daily before meals.  . ondansetron (ZOFRAN) 4 MG tablet Take 1 tablet (4 mg total) by mouth every 8 (eight) hours as needed for nausea or vomiting.  . pantoprazole (PROTONIX) 40 MG tablet Take 1 tablet (40 mg total) by mouth daily.  . promethazine (PHENERGAN) 25 MG suppository Place 1 suppository (25 mg total) rectally every 6 (six) hours as needed for nausea or vomiting.  Marland Kitchen  sodium bicarbonate 650 MG tablet Take 1 tablet (650 mg total) by mouth 2 (two) times daily.  . traZODone (DESYREL) 50 MG tablet Take 2 tablets (100 mg total) by mouth at bedtime.  . triamcinolone cream (KENALOG) 0.1 % Apply 1 application topically 2 (two) times daily. (Patient taking differently: Apply 1 application topically 2 (two) times daily as needed (irritation). )     Allergies: Allergies as of 11/22/2016  . (No Known Allergies)   Past Medical History:  Diagnosis Date  . CKD (chronic kidney disease), stage III   . Cyclical vomiting    THC induced???  . Depression   . DKA (diabetic ketoacidoses)  ([place]) 12/2014  . Erosive esophagitis   . Gastroparesis    ? diabetic  . GERD (gastroesophageal reflux disease)   . Human immunodeficiency virus (HIV) disease ([place]) dx'd 04/2016  . Hypertension   . Normocytic anemia   . Type II diabetes mellitus ([place]) 1994   diagnosed around 40    Family History:  Family History  Problem Relation Age of Onset  . Diabetes Mother   . Diabetes Brother   . Diabetes Daughter   . Diabetes Daughter   . Mental retardation Brother     died from PNA     Social History:  Social History   Social History  . Marital status: Single    Spouse name: N/A  . Number of children: N/A  . Years of education: 10   Occupational History  .  Unemployed   Social History Main Topics  . Smoking status: Former Smoker    Packs/day: 0.10    Years: 10.00    Types: Cigarettes    Start date: 11/17/2013    Quit date: 09/09/2014  . Smokeless tobacco: Never Used  . Alcohol use Yes     Comment: 09/30/2016  "maybe birthdays or holidays; very occasional"  . Drug use: Yes    Frequency: 4.0 times per week    Types: Marijuana     Comment: 09/30/2016 "couple times/week"  . Sexual activity: Not Currently    Partners: Female, Female   Other Topics Concern  . Not on file   Social History Narrative  . No narrative on file     Review of Systems: A complete ROS was negative except as per HPI.   Physical Exam: Blood pressure 108/72, pulse 88, temperature 98.3 F (36.8 C), temperature source Oral, resp. rate 18, height 5\' 5"  (1.651 m), weight 78.5 kg (173 lb), last menstrual period 11/15/2016, SpO2 99 %. Physical Exam  Constitutional: She is oriented to person, place, and time. She appears well-developed and well-nourished.  HENT:  Head: Normocephalic and atraumatic.  Cardiovascular: Normal rate and regular rhythm.   Respiratory: Effort normal and breath sounds normal.  GI: Soft. She exhibits no distension. There is tenderness.  Mild tenderness to palpation diffusely  throughout the abdomen. No focal tenderness on examination. Bowel sounds present and normal.  Musculoskeletal: She exhibits no edema.  Neurological: She is alert and oriented to person, place, and time.  Skin:  Multiple tattoos present on her lower and upper extremities and neck     EKG: Not ordered by the emergency physician. The admitting team will order one and follow-up with results.  CXR: Chest x-ray not ordered at the time of admission. Admitting team will order and follow-up with results.  Assessment & Plan by Problem: Active Problems:   Nausea & vomiting  Patient is a 41 year old female who presents with nausea, vomiting and  diffuse abdominal pain.  # Nausea, vomiting and abdominal pain Patient has history of gastroparesis and multiple episodes of abdominal pain, nausea and vomiting. Her current presentation does not seem consistent with an acute viral illness given lack of fever, white count or diarrhea. Her presentation may be secondary to a flare of gastroparesis. However, given that the patient has not had a bowel movement since Thursday and has not passed gas she may have some component of a small bowel obstruction. I find this unlikely given nonfocal abdominal examination however this should be ruled out. Will order abdominal radiographs for evaluation. Also, the patient's diagnosis of gastroparesis has been made clinically and she has not had a gastric emptying study. I think she would benefit from this either inpatient or with follow-up with GI in the outpatient setting. For now, we'll admit the patient for IV hydration and symptom management. -- GI cocktail -- Zofran 4 mg IV every 6 hours when necessary nausea and vomiting -- Metoclopramide 10mg  oral TID -- Abdominal series -- f/u UDS  # Acute on Chronic Kidney Injury with normal Potassium  Patient's creatinine at presentation 3.31 which is elevated from yesterday at 2.73. Most likely pre-renal 2/2 decreased PO intake, N/V  and diuresis 2/2 hyperglycemia. I expect improvement with IV hydration.  -- NS @ 125 ml/hr -- Given 1 L normal saline in the emergency department  # Type 2DM ## Hyperglycemia -- Sliding scale insulin  # HIV Pt with Hx of HIV. Will restart medications. She is very private about this diagnosis. Please Avoid discussing this with the patient especially if others are in the room -- Continue home medications -- Use discretion when discussing this disease with the patient   DVT/PE prophylaxis: Lovenox FEN/GI: Carb modified  Code: Full code  Dispo: Admit patient to Observation with expected length of stay less than 2 midnights.  Signed: Ophelia Shoulder, MD 11/22/2016, 9:57 PM  Pager: [phone]

## 2016-11-22 NOTE — Discharge Instructions (Signed)
Please continue to stay well-hydrated. Drink frequent, small sips of water that are close to room temperature. Utilize Reglan, Zofran, or Phenergan for nausea or vomiting. Follow up with gastroenterology on this issue. Call the number provided to set up an appointment. Start slowly with bland or liquid foods. Advance your diet slowly to avoid recurrence of your symptoms.

## 2016-11-22 NOTE — ED Provider Notes (Signed)
[facility] DEPT Provider Note   CSN: [id number] Arrival date & time: 11/22/16  1454     History   Chief Complaint Chief Complaint  Patient presents with  . Abdominal Pain  . Emesis    HPI DORITA ROWLANDS is a 41 y.o. female.  HPI  IYANI DRESNER is a 41 y.o. female, with a history of HIV, cyclical vomiting, DM, and HTN, presenting to the ED with epigastric pain accompanied by nausea and vomiting beginning four days ago. Patient states that this is an exacerbation of her gastroparesis. She states she has not been able to keep any solid food down for the last few days. Has been vomiting liquids as well. Emesis is nonbilious, nonbloody. Epigastric pain is sharp and burning, sometimes radiates through to the back and into the chest with vomiting, rated 10 out of 10. Patient was seen for this issue on April 7, admitted, and then discharged on April 9. Patient returned on April 13, had a negative abdominal CT, and discharged with instructions for PCP follow-up.  Patient denies diarrhea, constipation, fever, shortness of breath, or any other complaints.  Patient's last CD4 count and HIV viral load was on 10/07/2016 and was 430 and undetectable, respectively.  LMP two weeks ago. Patient states the HIV diagnosis is fairly new for her. She informed me that her daughter at the bedside is not aware of the HIV diagnosis and wants to keep it from her at this time. This was not shared with me prior to the initial interview and she did not ask her daughter to leave the room until after the initial interview.   Past Medical History:  Diagnosis Date  . CKD (chronic kidney disease), stage III   . Cyclical vomiting    THC induced???  . Depression   . DKA (diabetic ketoacidoses) ([place]) 12/2014  . Erosive esophagitis   . Gastroparesis    ? diabetic  . GERD (gastroesophageal reflux disease)   . Human immunodeficiency virus (HIV) disease ([place]) dx'd 04/2016  . Hypertension   . Normocytic anemia    . Type II diabetes mellitus ([place]) 1994   diagnosed around 1994    Patient Active Problem List   Diagnosis Date Noted  . Nausea & vomiting 11/22/2016  . Acute-on-chronic kidney injury ([place]) 11/14/2016  . Aortic atherosclerosis ([place]) 09/30/2016  . Orthostatic hypotension 09/30/2016  . Hypokalemia 09/30/2016  . Diabetic foot ulcer associated with diabetes mellitus due to underlying condition ([place]) 07/08/2016  . CKD (chronic kidney disease) stage 3, GFR 30-59 ml/min 06/19/2016  . Human immunodeficiency virus (HIV) disease ([place]) 04/23/2016  . Contact dermatitis 04/21/2016  . Diabetic gastroparesis ([place])   . Moderate nonproliferative diabetic retinopathy of both eyes ([place]) 11/21/2014  . Esophageal reflux   . Microscopic hematuria 10/31/2014  . Essential hypertension 11/16/2013  . Diabetic nephropathy ([place]) 12/07/2012  . TOBACCO USER 06/28/2006  . Depression 06/28/2006  . Diabetes mellitus type 2 in obese [facility]) 06/28/1994    Past Surgical History:  Procedure Laterality Date  . TUBAL LIGATION  2002    OB History    No data available       Home Medications    Prior to Admission medications   Medication Sig Start Date End Date Taking? Authorizing Provider  amLODipine (NORVASC) 5 MG tablet Take 1 tablet (5 mg total) by mouth daily. 10/06/16  Yes Milagros Loll, MD  dolutegravir (TIVICAY) 50 MG tablet Take 1 tablet (50 mg total) by mouth daily. 07/08/16  Yes  Campbell Riches, MD  DULoxetine HCl 40 MG CPEP Take 40 mg by mouth daily. 10/06/16  Yes Milagros Loll, MD  emtricitabine-tenofovir AF (DESCOVY) 200-25 MG tablet Take 1 tablet by mouth daily. 07/08/16  Yes Campbell Riches, MD  Insulin Glargine (LANTUS) 100 UNIT/ML Solostar Pen Inject 15 Units into the skin daily at 10 pm. Patient taking differently: Inject 15 Units into the skin daily.  05/22/16  Yes Rushil Sherrye Payor, MD  metFORMIN (GLUCOPHAGE) 1000 MG tablet Take 1 tablet (1,000 mg total) by mouth daily with breakfast.  05/22/16  Yes Rushil Sherrye Payor, MD  metoCLOPramide (REGLAN) 10 MG tablet Take 1 tablet (10 mg total) by mouth 3 (three) times daily before meals. 11/16/16  Yes Jessica Ratliff Hoffman, DO  ondansetron (ZOFRAN) 4 MG tablet Take 1 tablet (4 mg total) by mouth every 8 (eight) hours as needed for nausea or vomiting. 11/16/16  Yes Jessica Ratliff Hoffman, DO  pantoprazole (PROTONIX) 40 MG tablet Take 1 tablet (40 mg total) by mouth daily. 09/29/16  Yes Alexa Angela Burke, MD  promethazine (PHENERGAN) 25 MG suppository Place 1 suppository (25 mg total) rectally every 6 (six) hours as needed for nausea or vomiting. 11/21/16  Yes Nona Dell, PA-C  sodium bicarbonate 650 MG tablet Take 1 tablet (650 mg total) by mouth 2 (two) times daily. 06/19/16 06/19/17 Yes Shela Leff, MD  traZODone (DESYREL) 50 MG tablet Take 2 tablets (100 mg total) by mouth at bedtime. 10/06/16  Yes Milagros Loll, MD  triamcinolone cream (KENALOG) 0.1 % Apply 1 application topically 2 (two) times daily. Patient taking differently: Apply 1 application topically 2 (two) times daily as needed (irritation).  03/27/16  Yes Janne Napoleon, NP    Family History Family History  Problem Relation Age of Onset  . Diabetes Mother   . Diabetes Brother   . Diabetes Daughter   . Diabetes Daughter   . Mental retardation Brother     died from PNA    Social History Social History  Substance Use Topics  . Smoking status: Former Smoker    Packs/day: 0.10    Years: 10.00    Types: Cigarettes    Start date: 11/17/2013    Quit date: 09/09/2014  . Smokeless tobacco: Never Used  . Alcohol use Yes     Comment: 09/30/2016  "maybe birthdays or holidays; very occasional"     Allergies   Patient has no known allergies.   Review of Systems Review of Systems  Constitutional: Negative for chills, diaphoresis and fever.  Respiratory: Negative for shortness of breath.   Gastrointestinal: Positive for abdominal pain, nausea and vomiting.  Negative for blood in stool.  Genitourinary: Negative for dysuria, frequency and hematuria.  Neurological: Negative for dizziness and light-headedness.  All other systems reviewed and are negative.    Physical Exam Updated Vital Signs BP (!) 124/92 (BP Location: Right Arm)   Pulse (!) 103   Temp 98.3 F (36.8 C) (Oral)   Resp (!) 22   Ht 5\' 5"  (1.651 m)   Wt 78.5 kg   LMP 11/15/2016 (Exact Date)   SpO2 100%   BMI 28.79 kg/m   Physical Exam  Constitutional: She appears well-developed and well-nourished. No distress.  HENT:  Head: Normocephalic and atraumatic.  Eyes: Conjunctivae are normal.  Neck: Neck supple.  Cardiovascular: Normal rate, regular rhythm, normal heart sounds and intact distal pulses.   Pulmonary/Chest: Effort normal and breath sounds normal. No respiratory distress.  Abdominal: Soft.  There is tenderness in the epigastric area. There is no guarding.  Patient's only indication of pain was verbal. Was able to change positions without observed hesitation or difficulty.  Musculoskeletal: She exhibits no edema.  Lymphadenopathy:    She has no cervical adenopathy.  Neurological: She is alert.  Skin: Skin is warm and dry. She is not diaphoretic.  Psychiatric: She has a normal mood and affect. Her behavior is normal.  Nursing note and vitals reviewed.    ED Treatments / Results  Labs (all labs ordered are listed, but only abnormal results are displayed) Labs Reviewed  CBC WITH DIFFERENTIAL/PLATELET - Abnormal; Notable for the following:       Result Value   RBC 3.75 (*)    Hemoglobin 11.6 (*)    HCT 34.4 (*)    All other components within normal limits  COMPREHENSIVE METABOLIC PANEL - Abnormal; Notable for the following:    Sodium 133 (*)    Chloride 97 (*)    Glucose, Bld 325 (*)    BUN 27 (*)    Creatinine, Ser 3.31 (*)    Albumin 3.1 (*)    GFR calc non Af Amer 16 (*)    GFR calc Af Amer 19 (*)    All other components within normal limits   URINALYSIS, ROUTINE W REFLEX MICROSCOPIC - Abnormal; Notable for the following:    Glucose, UA 150 (*)    Protein, ur >=300 (*)    Bacteria, UA RARE (*)    Squamous Epithelial / LPF 0-5 (*)    All other components within normal limits  I-STAT BETA HCG BLOOD, ED ([facility], [facility], [facility] ONLY) - Abnormal; Notable for the following:    I-stat hCG, quantitative 7.2 (*)    All other components within normal limits  LIPASE, BLOOD  PREGNANCY, URINE  RAPID URINE DRUG SCREEN, HOSP PERFORMED   Hemoglobin  Date Value Ref Range Status  11/22/2016 11.6 (L) 12.0 - 15.0 g/dL Final  11/20/2016 10.6 (L) 12.0 - 15.0 g/dL Final  11/15/2016 11.2 (L) 12.0 - 15.0 g/dL Final  11/14/2016 13.2 12.0 - 15.0 g/dL Final   BUN  Date Value Ref Range Status  11/22/2016 27 (H) 6 - 20 mg/dL Final  11/20/2016 28 (H) 6 - 20 mg/dL Final  11/16/2016 20 6 - 20 mg/dL Final  11/15/2016 26 (H) 6 - 20 mg/dL Final  10/06/2016 25 (H) 6 - 20 mg/dL Final  04/23/2016 12 6 - 20 mg/dL Final   Creat  Date Value Ref Range Status  05/27/2016 1.68 (H) 0.50 - 1.10 mg/dL Final  11/14/2014 0.92 0.50 - 1.10 mg/dL Final  11/24/2013 1.03 0.50 - 1.10 mg/dL Final   Creatinine, Ser  Date Value Ref Range Status  11/22/2016 3.31 (H) 0.44 - 1.00 mg/dL Final  11/20/2016 2.73 (H) 0.44 - 1.00 mg/dL Final  11/16/2016 2.43 (H) 0.44 - 1.00 mg/dL Final  11/15/2016 2.43 (H) 0.44 - 1.00 mg/dL Final     EKG  EKG Interpretation None       Radiology Ct Abdomen Pelvis Wo Contrast  Result Date: 11/21/2016 CLINICAL DATA:  Abdominal pain. History of gastroparesis and erosive esophagitis. EXAM: CT ABDOMEN AND PELVIS WITHOUT CONTRAST TECHNIQUE: Multidetector CT imaging of the abdomen and pelvis was performed following the standard protocol without IV contrast. COMPARISON:  CT abdomen pelvis 11/20/2015 FINDINGS: Lower chest: No pulmonary nodules or pleural effusion. No visible pericardial effusion. Hepatobiliary: Normal noncontrast appearance of the liver.  No visible biliary dilatation. There is sludge  or small stones within the gallbladder. No evidence of acute inflammation. Pancreas: Normal noncontrast appearance of the pancreas. No peripancreatic fluid collection. Spleen: Normal. Adrenal glands: Normal. Urinary Tract: --Right kidney/ureter: No hydronephrosis or perinephric stranding. No nephrolithiasis. No obstructing ureteral stones. --Left kidney/ureter: No hydronephrosis or perinephric stranding. No nephrolithiasis. No obstructing ureteral stones. --Urinary bladder: Unremarkable. Stomach/Bowel: No dilated loops of bowel. No evidence of colonic or enteric inflammation. No fluid collection within the abdomen. The appendix is not clearly visualized. Vascular/Lymphatic: There is atherosclerotic calcification of the non aneurysmal abdominal aorta. No abdominal or pelvic lymphadenopathy. Reproductive: Normal uterus.  No adnexal lesion. Musculoskeletal. No focal osseous lesion. Normal visualized extraperitoneal and extrathoracic soft tissues. IMPRESSION: 1. No acute abnormality of the abdomen or pelvis. 2. Aortic atherosclerosis. 3. Gallbladder sludge or stones, unchanged. Electronically Signed   By: Ulyses Jarred M.D.   On: 11/21/2016 00:42    Procedures Procedures (including critical care time)  Medications Ordered in ED Medications  capsaicin (ZOSTRIX) 0.025 % cream (not administered)  sodium chloride 0.9 % bolus 1,000 mL (0 mLs Intravenous Stopped 11/22/16 1900)  metoCLOPramide (REGLAN) injection 10 mg (10 mg Intravenous Given 11/22/16 1706)  morphine 4 MG/ML injection 4 mg (4 mg Intravenous Given 11/22/16 1707)  metoCLOPramide (REGLAN) injection 10 mg (10 mg Intravenous Given 11/22/16 2104)     Initial Impression / Assessment and Plan / ED Course  I have reviewed the triage vital signs and the nursing notes.  Pertinent labs & imaging results that were available during my care of the patient were reviewed by me and considered in my medical decision  making (see chart for details).  Clinical Course as of Nov 22 2144  Nancy Fetter Nov 22, 2016  1810 Patient states she has not vomited since receiving the Reglan. Her nausea and pain have resolved.  [SJ]  2145 Spoke with Anderson Malta, Internal Medicine resident, who agreed to come see the patient and admit her.  [SJ]    Clinical Course User Index [SJ] Lorayne Bender, PA-C    Patient presents with epigastric abdominal pain and vomiting. Suspect possible recurrence of her gastroparesis. Patient is nontoxic appearing, afebrile, not tachycardic on my exam, not tachypneic, not hypotensive, maintains excellent SPO2 on room air, and is in no apparent distress. Patient has no signs of sepsis or other serious or life-threatening condition. She was able to tolerate PO here in the ED on multiple occasions. She was observed for a number hours without recurrence of her vomiting or abdominal pain. GI follow-up. Resources given. Patient already has multiple prescriptions for antiemetics. The patient was given instructions for home care as well as return precautions.  9:13 PM Patient now states she was unable to keep her soda down and started vomiting again. Patient states she doesn't think she will be able to keep anything down at home.  Vitals:   11/22/16 2000 11/22/16 2015 11/22/16 2030 11/22/16 2045  BP: (!) 188/99 (!) 169/96 (!) 155/96 108/72  Pulse: 79 82  88  Resp:    18  Temp:      TempSrc:      SpO2: 100% 99%  99%  Weight:      Height:         Final Clinical Impressions(s) / ED Diagnoses   Final diagnoses:  Intractable cyclical vomiting with nausea    New Prescriptions New Prescriptions   No medications on file     Layla Maw 11/22/16 2045    Lorayne Bender, PA-C 11/22/16  2146    Forde Dandy, MD 11/23/16 1230

## 2016-11-22 NOTE — ED Triage Notes (Signed)
Pt refused to have labs drawn in triage

## 2016-11-22 NOTE — ED Notes (Signed)
Pt unable to get urine sample, will reattempt later

## 2016-11-22 NOTE — ED Triage Notes (Signed)
Pt to ED with c/o epigastric pain onset 3 days ago,  PT st's she was recently in hosp for gastroparesis and she is not getting any better.  Pt also c/p nausea and vomiting

## 2016-11-22 NOTE — ED Notes (Signed)
Pt unable to tolerate PO fluids. Pt vomited 15 minutes after drinking fluids.

## 2016-11-23 DIAGNOSIS — Z21 Asymptomatic human immunodeficiency virus [HIV] infection status: Secondary | ICD-10-CM | POA: Diagnosis not present

## 2016-11-23 DIAGNOSIS — N179 Acute kidney failure, unspecified: Secondary | ICD-10-CM | POA: Diagnosis not present

## 2016-11-23 DIAGNOSIS — N189 Chronic kidney disease, unspecified: Secondary | ICD-10-CM | POA: Diagnosis not present

## 2016-11-23 DIAGNOSIS — E1143 Type 2 diabetes mellitus with diabetic autonomic (poly)neuropathy: Secondary | ICD-10-CM

## 2016-11-23 DIAGNOSIS — K5900 Constipation, unspecified: Secondary | ICD-10-CM

## 2016-11-23 DIAGNOSIS — Z794 Long term (current) use of insulin: Secondary | ICD-10-CM | POA: Diagnosis not present

## 2016-11-23 DIAGNOSIS — E1122 Type 2 diabetes mellitus with diabetic chronic kidney disease: Secondary | ICD-10-CM | POA: Diagnosis not present

## 2016-11-23 DIAGNOSIS — Z79899 Other long term (current) drug therapy: Secondary | ICD-10-CM | POA: Diagnosis not present

## 2016-11-23 DIAGNOSIS — K3184 Gastroparesis: Secondary | ICD-10-CM

## 2016-11-23 DIAGNOSIS — I129 Hypertensive chronic kidney disease with stage 1 through stage 4 chronic kidney disease, or unspecified chronic kidney disease: Secondary | ICD-10-CM | POA: Diagnosis not present

## 2016-11-23 DIAGNOSIS — E1165 Type 2 diabetes mellitus with hyperglycemia: Secondary | ICD-10-CM | POA: Diagnosis not present

## 2016-11-23 LAB — BASIC METABOLIC PANEL
Anion gap: 8 (ref 5–15)
BUN: 19 mg/dL (ref 6–20)
CO2: 25 mmol/L (ref 22–32)
Calcium: 8.6 mg/dL — ABNORMAL LOW (ref 8.9–10.3)
Chloride: 105 mmol/L (ref 101–111)
Creatinine, Ser: 2.91 mg/dL — ABNORMAL HIGH (ref 0.44–1.00)
GFR calc Af Amer: 22 mL/min — ABNORMAL LOW (ref >60)
GFR calc non Af Amer: 19 mL/min — ABNORMAL LOW (ref >60)
Glucose, Bld: 108 mg/dL — ABNORMAL HIGH (ref 65–99)
Potassium: 3.6 mmol/L (ref 3.5–5.1)
Sodium: 138 mmol/L (ref 135–145)

## 2016-11-23 LAB — RAPID URINE DRUG SCREEN, HOSP PERFORMED
Amphetamines: NOT DETECTED
Barbiturates: POSITIVE — AB
Benzodiazepines: NOT DETECTED
Cocaine: NOT DETECTED
Opiates: POSITIVE — AB
Tetrahydrocannabinol: POSITIVE — AB

## 2016-11-23 LAB — CBC
HCT: 31.1 % — ABNORMAL LOW (ref 36.0–46.0)
Hemoglobin: 10.2 g/dL — ABNORMAL LOW (ref 12.0–15.0)
MCH: 30.3 pg (ref 26.0–34.0)
MCHC: 32.8 g/dL (ref 30.0–36.0)
MCV: 92.3 fL (ref 78.0–100.0)
Platelets: 343 10*3/uL (ref 150–400)
RBC: 3.37 MIL/uL — ABNORMAL LOW (ref 3.87–5.11)
RDW: 13.6 % (ref 11.5–15.5)
WBC: 6.5 10*3/uL (ref 4.0–10.5)

## 2016-11-23 LAB — GLUCOSE, CAPILLARY
Glucose-Capillary: 75 mg/dL (ref 65–99)
Glucose-Capillary: 138 mg/dL — ABNORMAL HIGH (ref 65–99)
Glucose-Capillary: 99 mg/dL (ref 65–99)

## 2016-11-23 MED ORDER — POLYETHYLENE GLYCOL 3350 17 G PO PACK: 17.0000 g | PACK | Freq: Once | ORAL | Status: DC

## 2016-11-23 MED ORDER — ONDANSETRON 4 MG PO TBDP: 4.0000 mg | ORAL_TABLET | Freq: Three times a day (TID) | ORAL | 0 refills | Status: DC | PRN

## 2016-11-23 MED ORDER — SENNOSIDES-DOCUSATE SODIUM 8.6-50 MG PO TABS: 1.0000 | ORAL_TABLET | Freq: Every day | ORAL | Status: DC

## 2016-11-23 NOTE — Progress Notes (Signed)
Orders received for pt discharge.  Discharge summary printed and reviewed with pt.  Explained medication regimen, and pt had no further questions at this time.  IV removed and site remains clean, dry, intact.  Pt was already non telemetry.  Pt in stable condition and awaiting transport via provided bus voucher.

## 2016-11-23 NOTE — Care Management Note (Signed)
Case Management Note  Patient Details  Name: Kathy Frank MRN: [id number] Date of Birth: [birth year]  Subjective/Objective:                    Action/Plan: Pt discharging home with self care. Pt has Medicaid and PCP. CM inquired about transportation home. Pt is working on having someone pick her up. CM informed her to let bedside RN know if unable to obtain transportation. CSW informed.   Expected Discharge Date:  11/23/16               Expected Discharge Plan:  Home/Self Care  In-House Referral:     Discharge planning Services     Post Acute Care Choice:    Choice offered to:     DME Arranged:    DME Agency:     HH Arranged:    HH Agency:     Status of Service:  Completed, signed off  If discussed at [employer] of Stay Meetings, dates discussed:    Additional Comments:  Pollie Friar, RN 11/23/2016, 3:08 PM

## 2016-11-23 NOTE — Discharge Summary (Signed)
Name: Kathy Frank MRN: [id number] DOB: June 12, 1976 41 y.o. PCP: Riccardo Dubin, MD  Date of Admission: 11/22/2016  3:21 PM Date of Discharge: 11/23/2016 Attending Physician: Aldine Contes, MD  Discharge Diagnosis: 1. Diabetic gastroparesis 2. Acute on chronic kidney injury   Active Problems:   Nausea & vomiting   Discharge Medications: Allergies as of 11/23/2016   No Known Allergies     Medication List    STOP taking these medications   ondansetron 4 MG tablet Commonly known as:  ZOFRAN   promethazine 25 MG suppository Commonly known as:  PHENERGAN     TAKE these medications   amLODipine 5 MG tablet Commonly known as:  NORVASC Take 1 tablet (5 mg total) by mouth daily.   dolutegravir 50 MG tablet Commonly known as:  TIVICAY Take 1 tablet (50 mg total) by mouth daily.   DULoxetine HCl 40 MG Cpep Take 40 mg by mouth daily.   emtricitabine-tenofovir AF 200-25 MG tablet Commonly known as:  DESCOVY Take 1 tablet by mouth daily.   Insulin Glargine 100 UNIT/ML Solostar Pen Commonly known as:  LANTUS Inject 15 Units into the skin daily at 10 pm. What changed:  when to take this   metFORMIN 1000 MG tablet Commonly known as:  GLUCOPHAGE Take 1 tablet (1,000 mg total) by mouth daily with breakfast.   metoCLOPramide 10 MG tablet Commonly known as:  REGLAN Take 1 tablet (10 mg total) by mouth 3 (three) times daily before meals.   ondansetron 4 MG disintegrating tablet Commonly known as:  ZOFRAN-ODT Take 1 tablet (4 mg total) by mouth every 8 (eight) hours as needed for nausea or vomiting.   pantoprazole 40 MG tablet Commonly known as:  PROTONIX Take 1 tablet (40 mg total) by mouth daily.   sodium bicarbonate 650 MG tablet Take 1 tablet (650 mg total) by mouth 2 (two) times daily.   traZODone 50 MG tablet Commonly known as:  DESYREL Take 2 tablets (100 mg total) by mouth at bedtime.   triamcinolone cream 0.1 % Commonly known as:  KENALOG Apply 1  application topically 2 (two) times daily. What changed:  when to take this  reasons to take this       Disposition and follow-up:   Kathy Frank was discharged from [facility] in stable condition.  At the hospital follow up visit please address:  1.  Assess symptoms of nausea and vomiting. Patient was given a two-week course of metoclopramide.  She was also given zofran disintegrating tablet #20.  Assess benefit if patient is taking.  Assess barriers if patient does not have medication. 2.  Assess blood pressure.  Patient is on amlodipine 5mg  and lisinopril 20mg .  Patient's lisinopril was held on admission and discharge due to AKI.  Please restart lisinopril if needed. 3. Consider Gastrointestinal referral to further assess gastroparesis.     3.  Labs / imaging needed at time of follow-up: BMET, creatinine   4.  Pending labs/ test needing follow-up: none  Follow-up Appointments: Follow-up Information    MANN,JYOTHI, MD.   Specialty:  Gastroenterology Why:  As soon as possible for follow up Contact information: [address] [place] [address] [place] by problem list: Active Problems:   Nausea & vomiting   Nausea / Vomitting 2/2 Diabetic Gastroparesis  Patient was recently admitted on 11/14/2016 for nausea and vomiting secondary to gastroparesis  versus gastroenteritis. Patient initially improved with Reglan and Zofran and was discharged home on oral pills.  She presented to the ED with recurrent nausea and vomiting.  She was unable to take zofran and metoclopramide given to her on discharge due to recurrent vomiting.  On admission patient was afebrile with no leukocytosis.  Symptoms improved with Metoclopramide, antiemetics, and IVF hydration. On day of discharge patient was able to tolerate solids and fluids. Patient no longer complained of abdominal pain and abdominal tenderness was not appreciated  on exam. She was told to continue taking metoclopramide with meals for 2 weeks. As well as disintegrating zofran as needed.  She was advised to follow up with Gastroenterology on discharge to further assess her gastroparesis.  And follow up with the Internal Medicine Clinic for which she has appointment on April 23th at 10:15am.  Constipation Patient reports not having a bowel movement for several days prior to admission. Abdominal series was ordered and no bowel obstruction was noted. Patient was given senna docusate during admission.  Acute on chronic kidney injury  Patient's creatinine on admission was 3.3 and on discharge was 2.9 it appears that patient's baseline is around 2.0-2.4. Likely secondary to dehydration in the setting of nausea and vomiting.   Diabetes Mellitus type II w/ Hyperglycemia Serum blood glucose was 325 on admission. She did not have ketones in her urine or have an anion gap.  Home meds include Lantus 15U qHS + Metformin 100mg  qD.  Home dose of Lantus was continued along with a sliding scale insulin. Metformin was held.  Blood sugars were between 100 -200 during her stay.  Hypertension BP elevated on presentation. Home Amlodipine 5mg  was continued. Lisinopril was held in the setting of AKI. Blood pressures were labile.  Human immunodeficiency virus Currently on Descovy and Tivicay, follows w/ [facility]. CD4 was 430 w/ undetectable HIV on 10/07/16. Home meds were continued.   Discharge Vitals:   BP 135/72 (BP Location: Left Arm)   Pulse 90   Temp 98 F (36.7 C) (Oral)   Resp 17   Ht 5\' 5"  (1.651 m)   Wt 167 lb 14.4 oz (76.2 kg)   LMP 11/15/2016 (Exact Date) Comment: neg preg test  SpO2 98%   BMI 27.94 kg/m    Discharge Instructions: Discharge Instructions    Diet - low sodium heart healthy    Complete by:  As directed    Discharge instructions    Complete by:  As directed    Kathy Frank, Please take Zofran, disintegrating tablet, every 8 hours as needed for  your nausea and vomiting. This will help with being able to take your medications and eating.  Please follow up in the internal medicine clinic after discharge.   Increase activity slowly    Complete by:  As directed       Signed: Valinda Party, DO 11/23/2016, 1:44 PM   Pager: [phone]

## 2016-11-23 NOTE — Progress Notes (Signed)
   Subjective: Patient was evaluated this morning on rounds. She reports improvement in her symptoms. She denies any vomiting or abdominal pain since admission. She was sitting up and eating her breakfast which included grits and a hard boiled egg. She reports no issues with oral intake at the present time.  Objective:  Vital signs in last 24 hours: Vitals:   11/22/16 2045 11/22/16 2245 11/22/16 2344 11/23/16 0627  BP: 108/72 110/81 (!) 149/91 (!) 145/80  Pulse: 88 81 89 88  Resp: 18 18 16 16   Temp:   98.1 F (36.7 C) 98.3 F (36.8 C)  TempSrc:   Oral Oral  SpO2: 99% 99% 100% 96%  Weight:   167 lb 14.4 oz (76.2 kg)   Height:   5\' 5"  (1.651 m)    Physical Exam  Constitutional: She is well-developed, well-nourished, and in no distress.  Cardiovascular: Normal rate, regular rhythm and normal heart sounds.  Exam reveals no gallop and no friction rub.   No murmur heard. Pulmonary/Chest: Effort normal and breath sounds normal. No respiratory distress. She has no wheezes. She has no rales.  Abdominal: Soft. Bowel sounds are normal. She exhibits no distension. There is no tenderness. There is no rebound.     Assessment/Plan:  Active Problems:   Nausea & vomiting  Nausea / Vomitting 2/2 Diabetic Gastroparesis  Symptoms are improving with Metoclopramide, antiemetics, and IVF hydration. She is able to tolerate solids this morning.  No abdominal tenderness on physical exam.  Patient reports passing gas but has not had a bowel movement in several days.No bowel obstruction noted on abdominal series.  - Continue Metoclopramide TID with meals - Advance diet as tolerated - GI cocktail prn  - senna docusate  AKI on CKD w/ hypokalemia Likely 2/2 dehydration in the setting of N/V. Creatinine down to 2.9 from 3.31 on admission.  - encouraged oral intake as tolerable   DM w/ Hyperglycemia Serum blood glucose was 325 on admission. She did not have ketones in her urine or have an anion gap.   Home meds include Lantus 15U qHS + Metformin 100mg  qD. - Lantus to 15 U qHS  - SSI-s qAC/HS - hold metformin for AKI  HTN BP elevated on presentation 150s/90s.  Currently stable 135/72.  Will continue home Amlodipine 5mg , but holding lisinopril in setting of AKI. She is not on a higher dose Amlodipine due to history of peripheral edema with 10 mg dosing. - Continue Amlodipine 5 mg daily  HIV Currently on Descovy and Tivicay, follows w/ [facility]. CD4 was 430 w/ undetectable HIV on 10/07/16.  -Continue home meds.  Dispo: Anticipated discharge pending clinical improvement this afternoon.   Valinda Party, DO 11/23/2016, 11:22 AM Pager: [phone]

## 2016-11-30 ENCOUNTER — Ambulatory Visit: Payer: Medicaid Other

## 2016-12-01 ENCOUNTER — Telehealth: Payer: Self-pay | Admitting: Internal Medicine

## 2016-12-01 NOTE — Telephone Encounter (Signed)
APT. REMINDER CALL, LMTCB °

## 2016-12-02 ENCOUNTER — Ambulatory Visit (INDEPENDENT_AMBULATORY_CARE_PROVIDER_SITE_OTHER): Payer: Medicaid Other | Admitting: Internal Medicine

## 2016-12-02 ENCOUNTER — Other Ambulatory Visit: Payer: Self-pay | Admitting: Internal Medicine

## 2016-12-02 ENCOUNTER — Encounter (INDEPENDENT_AMBULATORY_CARE_PROVIDER_SITE_OTHER): Payer: Self-pay

## 2016-12-02 VITALS — BP 152/87 | HR 91 | Temp 98.0°F | Wt 179.2 lb

## 2016-12-02 DIAGNOSIS — F1220 Cannabis dependence, uncomplicated: Secondary | ICD-10-CM

## 2016-12-02 DIAGNOSIS — A084 Viral intestinal infection, unspecified: Secondary | ICD-10-CM

## 2016-12-02 DIAGNOSIS — Z79899 Other long term (current) drug therapy: Secondary | ICD-10-CM

## 2016-12-02 DIAGNOSIS — I10 Essential (primary) hypertension: Secondary | ICD-10-CM

## 2016-12-02 DIAGNOSIS — Z87891 Personal history of nicotine dependence: Secondary | ICD-10-CM

## 2016-12-02 DIAGNOSIS — I129 Hypertensive chronic kidney disease with stage 1 through stage 4 chronic kidney disease, or unspecified chronic kidney disease: Secondary | ICD-10-CM

## 2016-12-02 DIAGNOSIS — K5900 Constipation, unspecified: Secondary | ICD-10-CM | POA: Diagnosis not present

## 2016-12-02 DIAGNOSIS — R112 Nausea with vomiting, unspecified: Secondary | ICD-10-CM

## 2016-12-02 DIAGNOSIS — E1122 Type 2 diabetes mellitus with diabetic chronic kidney disease: Secondary | ICD-10-CM | POA: Diagnosis not present

## 2016-12-02 DIAGNOSIS — R1030 Lower abdominal pain, unspecified: Secondary | ICD-10-CM

## 2016-12-02 DIAGNOSIS — Z5189 Encounter for other specified aftercare: Secondary | ICD-10-CM

## 2016-12-02 DIAGNOSIS — N189 Chronic kidney disease, unspecified: Secondary | ICD-10-CM | POA: Diagnosis not present

## 2016-12-02 MED ORDER — SENNA-DOCUSATE SODIUM 8.6-50 MG PO TABS: 1.0000 | ORAL_TABLET | Freq: Every day | ORAL | 2 refills | Status: DC

## 2016-12-02 MED ORDER — AMLODIPINE BESYLATE 10 MG PO TABS: 10.0000 mg | ORAL_TABLET | Freq: Every day | ORAL | 2 refills | Status: DC

## 2016-12-02 NOTE — Patient Instructions (Signed)
General Instructions: - Increase Amlodipine to 10 mg daily - Continue to hold Lisinopril - Start senna-docusate 1 tablet at bedtime to help constipation - Follow up in 2 weeks for blood pressure recheck  Please bring your medicines with you each time you come to clinic.  Medicines may include prescription medications, over-the-counter medications, herbal remedies, eye drops, vitamins, or other pills.   Progress Toward Treatment Goals:  Treatment Goal 11/14/2014  Hemoglobin A1C unchanged  Blood pressure unchanged    Self Care Goals & Plans:  Self Care Goal 05/22/2016  Manage my medications take my medicines as prescribed; bring my medications to every visit; refill my medications on time  Monitor my health keep track of my blood glucose; bring my glucose meter and log to each visit  Eat healthy foods drink diet soda or water instead of juice or soda; eat more vegetables; eat foods that are low in salt; eat baked foods instead of fried foods; eat fruit for snacks and desserts  Be physically active find an activity I enjoy; take a walk every day  Meeting treatment goals maintain the current self-care plan    Home Blood Glucose Monitoring 11/14/2014  Check my blood sugar 2 times a day  When to check my blood sugar before breakfast; before dinner     Care Management & Community Referrals:  Referral 11/24/2013  Referrals made for care management support none needed  Referrals made to community resources none

## 2016-12-02 NOTE — Progress Notes (Signed)
   CC: Hospital follow up for gastroparesis   HPI:  [name] is a 41 y.o. woman with PMHx as noted below who presents today for hospital follow up of her gastroparesis.  She was hospitalized from 4/15-4/16 after presenting with nausea, vomiting, and diffuse abdominal pain. Her symptoms improved after Reglan, antiemetics, and IV fluids. Her symptoms were attributed to likely gastroparesis from her diabetes. She has never had a gastric emptying study.  She reports she is still having abdominal pain, particularly in the lower abdomen. She describes the pain as constant and achy. Pain is not as severe as when she was in the hospital. She reports nausea that is worse in the morning when she gets up but this is relieved with Zofran. She has had one episode of non-bilious, non-bloody vomiting last weekend. She has been taking Reglan 10 mg TID and eating small meals. She has been able to tolerate PO intake.  She is also describing straining with bowel movements since discharge from the hospital. Her last BM was yesterday but she was only able to defecate a small amount. She reports seeing blood with wiping for the past few days. She reports she was given a stool softener in the hospital and this helped her symptoms. She denies any blood in the stool.   Her BP is elevated today at 152/87. She is taking Amlodipine 5 mg daily. Her home Lisinopril was held at discharge due to AKI on CKD. Her Cr on discharge was 2.9 with baseline Cr in 2.0-2.4 range.   Past Medical History:  Diagnosis Date  . CKD (chronic kidney disease), stage III   . Cyclical vomiting    THC induced???  . Depression   . DKA (diabetic ketoacidoses) ([place]) 12/2014  . Erosive esophagitis   . Gastroparesis    ? diabetic  . GERD (gastroesophageal reflux disease)   . Human immunodeficiency virus (HIV) disease ([place]) dx'd 04/2016  . Hypertension   . Normocytic anemia   . Type II diabetes mellitus ([place]) 1994   diagnosed around 1994      Review of Systems:   All negative except per HPI  Physical Exam:  Vitals:   12/02/16 1104  BP: (!) 152/87  Pulse: 91  Temp: 98 F (36.7 C)  TempSrc: Oral  SpO2: 100%  Weight: 179 lb 3.2 oz (81.3 kg)   General: Well-nourished woman in NAD HEENT: EOMI, sclera anicteric, mucus membranes moist CV: RRR, no m/g/r Pulm: CTA bilaterally, breaths non-labored Abd: BS+, soft, mild diffuse tenderness, no rebound or guarding   Assessment & Plan:   See Encounters Tab for problem based charting.  Patient discussed with Dr. Evette Doffing

## 2016-12-02 NOTE — Telephone Encounter (Signed)
As this patient was seen and deemed necessary for their treatment, I will refill this prescription for sodium bicarbonate.

## 2016-12-02 NOTE — Telephone Encounter (Signed)
As this patient was seen by me and deemed necessary for their treatment, I will refill this prescription for pantoprazole.

## 2016-12-03 ENCOUNTER — Encounter: Payer: Self-pay | Admitting: Internal Medicine

## 2016-12-03 DIAGNOSIS — K5900 Constipation, unspecified: Secondary | ICD-10-CM | POA: Insufficient documentation

## 2016-12-03 DIAGNOSIS — K5903 Drug induced constipation: Secondary | ICD-10-CM | POA: Insufficient documentation

## 2016-12-03 LAB — BMP8+ANION GAP
Anion Gap: 15 mmol/L (ref 10.0–18.0)
BUN/Creatinine Ratio: 9 (ref 9–23)
BUN: 22 mg/dL (ref 6–24)
CO2: 21 mmol/L (ref 18–29)
Calcium: 8.8 mg/dL (ref 8.7–10.2)
Chloride: 99 mmol/L (ref 96–106)
Creatinine, Ser: 2.54 mg/dL — ABNORMAL HIGH (ref 0.57–1.00)
GFR calc Af Amer: 26 mL/min/{1.73_m2} — ABNORMAL LOW (ref >59)
GFR calc non Af Amer: 23 mL/min/{1.73_m2} — ABNORMAL LOW (ref >59)
Glucose: 291 mg/dL — ABNORMAL HIGH (ref 65–99)
Potassium: 5 mmol/L (ref 3.5–5.2)
Sodium: 135 mmol/L (ref 134–144)

## 2016-12-03 NOTE — Assessment & Plan Note (Signed)
Will have her restart Senna-docusate 1 tablet at bedtime as this helped her constipation in the hospital.

## 2016-12-03 NOTE — Assessment & Plan Note (Addendum)
Patient with several year hx and multiple hospitalizations for abdominal pain with nausea/vomiting. These symptoms have been attributed to diabetic gastroparesis. However, patient has never undergone a gastric emptying study to confirm this diagnosis. She is still having some mild diffuse abdominal pain and nausea, but is tolerating PO intake and nausea controlled with Zofran. She has also been positive for THC consistently which could be causing a cyclic vomiting syndrome and could explain her symptoms. Will have her undergo a gastric emptying study soon while symptoms are under control.  Advised to continue Reglan 10 mg TID and eat small meals throughout the day.

## 2016-12-03 NOTE — Assessment & Plan Note (Addendum)
BP remains elevated. Advised to increase Amlodipine to 10 mg daily and to continue holding Lisinopril. Will check a bmet today to evaluate Cr. She will follow up in 2 weeks for BP recheck.   Update: Cr has come down to 2.54, previously 2.94 on hospital discharge. Continue to hold Lisinopril.

## 2016-12-04 ENCOUNTER — Other Ambulatory Visit: Payer: Self-pay | Admitting: Pulmonary Disease

## 2016-12-04 NOTE — Progress Notes (Signed)
Internal Medicine Clinic Attending  Case discussed with Dr. Rivet at the time of the visit.  We reviewed the resident's history and exam and pertinent patient test results.  I agree with the assessment, diagnosis, and plan of care documented in the resident's note.  

## 2016-12-06 NOTE — Telephone Encounter (Signed)
I have been unable to reach her though will need to speak with her. This medication is not ideal given her renal impairment.

## 2016-12-07 NOTE — Telephone Encounter (Signed)
I was unable to reach her today and will therefore not refill this medication until we can speak with her.

## 2016-12-22 ENCOUNTER — Other Ambulatory Visit: Payer: Self-pay | Admitting: Pulmonary Disease

## 2016-12-22 NOTE — Telephone Encounter (Signed)
As I mentioned in my refill request 4/27, this medication is contraindicated in her kidney disease. I will therefore not refill but would be happy to try another medication if she is willing to see me in clinic. If you are able to reach her, please convey this message to her. Thank you.

## 2016-12-23 NOTE — Telephone Encounter (Signed)
Tried patient's number, no answer, unable to leave msg

## 2016-12-24 ENCOUNTER — Other Ambulatory Visit: Payer: Self-pay | Admitting: *Deleted

## 2016-12-24 ENCOUNTER — Other Ambulatory Visit: Payer: Self-pay | Admitting: Internal Medicine

## 2016-12-24 ENCOUNTER — Ambulatory Visit (HOSPITAL_COMMUNITY): Payer: Medicaid Other | Attending: Student in an Organized Health Care Education/Training Program

## 2016-12-24 MED ORDER — ONDANSETRON 4 MG PO TBDP: 4.0000 mg | ORAL_TABLET | Freq: Three times a day (TID) | ORAL | 0 refills | Status: DC | PRN

## 2016-12-24 MED ORDER — METOCLOPRAMIDE HCL 5 MG PO TABS: 5.0000 mg | ORAL_TABLET | Freq: Three times a day (TID) | ORAL | 0 refills | Status: DC

## 2016-12-24 NOTE — Telephone Encounter (Signed)
Refill Request  metoCLOPramide (REGLAN) 10 MG tablet

## 2016-12-24 NOTE — Telephone Encounter (Signed)
As this patient was recently admitted for gastroparesis and deemed necessary for their treatment, I will refill this prescription for metoclopramide and ondansetron. She has a scheduled gastric emptying test today, and we suspect she likely has this disease. Given her chronic renal insufficiency, she should not get more than 20 mg in one day, so I will adjust her metoclopramide dose accordingly.

## 2016-12-24 NOTE — Telephone Encounter (Signed)
Refill Request   ondansetron (ZOFRAN-ODT) 4 MG disintegrating tablet

## 2017-01-11 ENCOUNTER — Encounter: Payer: Self-pay | Admitting: *Deleted

## 2017-01-26 ENCOUNTER — Other Ambulatory Visit: Payer: Self-pay | Admitting: Internal Medicine

## 2017-01-26 ENCOUNTER — Other Ambulatory Visit: Payer: Self-pay | Admitting: Pulmonary Disease

## 2017-01-26 DIAGNOSIS — F332 Major depressive disorder, recurrent severe without psychotic features: Secondary | ICD-10-CM

## 2017-01-28 ENCOUNTER — Other Ambulatory Visit: Payer: Self-pay | Admitting: *Deleted

## 2017-02-01 ENCOUNTER — Telehealth: Payer: Self-pay

## 2017-02-01 NOTE — Telephone Encounter (Signed)
Needs to speak with a nurse. Please call pt back.  

## 2017-02-01 NOTE — Telephone Encounter (Signed)
Pt states she has multiple open "oozing" sores on her ankles, legs and knuckles, they are painful, appt 6/26 at [facility]

## 2017-02-02 ENCOUNTER — Ambulatory Visit (INDEPENDENT_AMBULATORY_CARE_PROVIDER_SITE_OTHER): Payer: Medicaid Other | Admitting: Internal Medicine

## 2017-02-02 ENCOUNTER — Encounter: Payer: Self-pay | Admitting: Internal Medicine

## 2017-02-02 VITALS — BP 144/97 | HR 94 | Temp 98.6°F | Ht 65.0 in | Wt 176.4 lb

## 2017-02-02 DIAGNOSIS — L98499 Non-pressure chronic ulcer of skin of other sites with unspecified severity: Secondary | ICD-10-CM | POA: Insufficient documentation

## 2017-02-02 DIAGNOSIS — L818 Other specified disorders of pigmentation: Secondary | ICD-10-CM | POA: Diagnosis not present

## 2017-02-02 DIAGNOSIS — L989 Disorder of the skin and subcutaneous tissue, unspecified: Secondary | ICD-10-CM

## 2017-02-02 NOTE — Progress Notes (Addendum)
   CC: Leg sores  HPI:  [name] is a 41 y.o. woman here with a 2 week complaint of bilateral leg sores and left hand sore that have continued to appear in new locations.   See problem based assessment and plan below for additional details  Past Medical History:  Diagnosis Date  . CKD (chronic kidney disease), stage III   . Cyclical vomiting    THC induced???  . Depression   . DKA (diabetic ketoacidoses) ([place]) 12/2014  . Erosive esophagitis   . Gastroparesis    ? diabetic  . GERD (gastroesophageal reflux disease)   . Human immunodeficiency virus (HIV) disease (Kathy Frank) dx'd 04/2016  . Hypertension   . Normocytic anemia   . Type II diabetes mellitus ([place]) 1994   diagnosed around 1994    Review of Systems:  Review of Systems  Constitutional: Negative for chills, fever and weight loss.  HENT: Negative for sore throat.   Eyes: Negative for blurred vision.  Respiratory: Negative for cough.   Cardiovascular: Negative for chest pain and leg swelling.  Gastrointestinal: Negative for abdominal pain, constipation and diarrhea.  Genitourinary: Negative for dysuria.  Musculoskeletal: Negative for falls and joint pain.  Skin: Positive for rash. Negative for itching.  Neurological: Negative for sensory change.  Endo/Heme/Allergies: Positive for environmental allergies.  Psychiatric/Behavioral: The patient is nervous/anxious.     Physical Exam: Physical Exam  Constitutional: She is well-developed, well-nourished, and in no distress.  HENT:  Head: Normocephalic.  Mouth/Throat: Oropharynx is clear and moist. No oropharyngeal exudate.  Eyes: Conjunctivae are normal.  Cardiovascular: Normal rate and regular rhythm.   Pulmonary/Chest: Effort normal and breath sounds normal.  Musculoskeletal: She exhibits no edema, tenderness or deformity.  Lymphadenopathy:    She has no cervical adenopathy.  Skin: Skin is warm and dry.  Small flat well demarcated pale lesion on inner right  groin Multiple scattered pale bordered lesions, centrally nodular, with surrounding erythema are present on the left foot, left ankle, left calf, right ankle and posterior calf, and smaller lesion on the dorsum of the left hand Many tattoos No truncal lesions           Vitals:   02/02/17 0913  BP: (!) 144/97  Pulse: 94  Temp: 98.6 F (37 C)  TempSrc: Oral  SpO2: 100%  Weight: 176 lb 6.4 oz (80 kg)  Height: 5\' 5"  (1.651 m)    Assessment & Plan:   See Encounters Tab for problem based charting.  Patient seen with Dr. Lynnae January

## 2017-02-02 NOTE — Patient Instructions (Signed)
It was a pleasure to see you today Kathy Frank.  I think we need to have these skin changes evaluated by a Dermatologist rather than guessing on a treatment plan today. We will check several blood tests as well that may give [place] information about the cause.  We will be in touch with you later during this week with results and update on the plan with making an appointment.

## 2017-02-02 NOTE — Assessment & Plan Note (Signed)
HPI: [name] is here with a two-week history of new lesions on both legs and her left hand. She does not recall the exact time of onset and the different lesions have arisen at different times. The first one was on her left foot. These are somewhat painful and he had some associated white or clear drainage. She has not felt any fevers or chills or systemically ill during this time. She is a history of rash with and without blistering previously but none similar to the current skin changes. Dermatologic evaluation at that time included biopsy consistent with spongiotic dermatitis changes. She tried some topical Kenalog cream at home without noticing any improvement at this time. She has not had any recent changes in her medications.  A: She has new round purulent lesions that appear more nodular in their center than ulcerative. Her HIV is well controlled based on most recent evaluation in February so should not be at high risk for atypical invasive infection of the skin. I'm concerned for vasculitic lesions somewhat more than infectious based on the peripheral, scattered distribution of lesions. This could be a nodular or pustular type atypical presentation of pyoderma gangrenosum. This could just be a more advanced presentation of her previous blistering skin changes. Possibly excisional biopsy would be the best step indicated at this time. If this is pyoderma like to assess for possible underlying disease states.  P: Referral to dermatology for evaluation and possible biopsy Checking multiple serologies today for underlying vasculitic processes- ESR, CRP, ANA, ANCA Also we will repeat infectious disease assessment- repeat HIV VL, CD4 count, serum RPR I will wait on

## 2017-02-02 NOTE — Progress Notes (Signed)
Internal Medicine Clinic Attending  I saw and evaluated the patient.  I personally confirmed the key portions of the history and exam documented by Dr. Rice and I reviewed pertinent patient test results.  The assessment, diagnosis, and plan were formulated together and I agree with the documentation in the resident's note.  

## 2017-02-03 LAB — T-HELPER CELLS (CD4) COUNT (NOT AT ARMC)
CD4 % Helper T Cell: 25 % — ABNORMAL LOW (ref 33–55)
CD4 T Cell Abs: 390 /uL — ABNORMAL LOW (ref 400–2700)

## 2017-02-04 LAB — BMP8+ANION GAP
Anion Gap: 16 mmol/L (ref 10.0–18.0)
BUN/Creatinine Ratio: 9 (ref 9–23)
BUN: 24 mg/dL (ref 6–24)
CO2: 16 mmol/L — ABNORMAL LOW (ref 20–29)
Calcium: 8.7 mg/dL (ref 8.7–10.2)
Chloride: 106 mmol/L (ref 96–106)
Creatinine, Ser: 2.71 mg/dL — ABNORMAL HIGH (ref 0.57–1.00)
GFR calc Af Amer: 24 mL/min/{1.73_m2} — ABNORMAL LOW (ref >59)
GFR calc non Af Amer: 21 mL/min/{1.73_m2} — ABNORMAL LOW (ref >59)
Glucose: 211 mg/dL — ABNORMAL HIGH (ref 65–99)
Potassium: 3.7 mmol/L (ref 3.5–5.2)
Sodium: 138 mmol/L (ref 134–144)

## 2017-02-04 LAB — CBC
Hematocrit: 30 % — ABNORMAL LOW (ref 34.0–46.6)
Hemoglobin: 10.2 g/dL — ABNORMAL LOW (ref 11.1–15.9)
MCH: 31.6 pg (ref 26.6–33.0)
MCHC: 34 g/dL (ref 31.5–35.7)
MCV: 93 fL (ref 79–97)
Platelets: 318 10*3/uL (ref 150–379)
RBC: 3.23 x10E6/uL — ABNORMAL LOW (ref 3.77–5.28)
RDW: 14.5 % (ref 12.3–15.4)
WBC: 8.5 10*3/uL (ref 3.4–10.8)

## 2017-02-04 LAB — MPO/PR-3 (ANCA) ANTIBODIES
ANCA Proteinase 3: <3.5 U/mL (ref 0.0–3.5)
Myeloperoxidase Ab: <9 U/mL (ref 0.0–9.0)

## 2017-02-04 LAB — ANTINUCLEAR ANTIBODIES, IFA: ANA Titer 1: NEGATIVE

## 2017-02-04 LAB — SEDIMENTATION RATE: Sed Rate: 85 mm/hr — ABNORMAL HIGH (ref 0–32)

## 2017-02-04 LAB — HIV-1 RNA QUANT-NO REFLEX-BLD
HIV-1 RNA Viral Load Log: 1.903 log10copy/mL
HIV-1 RNA Viral Load: 80 copies/mL

## 2017-02-04 LAB — C-REACTIVE PROTEIN: CRP: 42.4 mg/L — ABNORMAL HIGH (ref 0.0–4.9)

## 2017-02-04 LAB — RPR: RPR Ser Ql: NONREACTIVE

## 2017-02-05 ENCOUNTER — Telehealth: Payer: Self-pay

## 2017-02-05 NOTE — Telephone Encounter (Signed)
Requesting result for skin biopsy. Please call pt back.

## 2017-02-08 NOTE — Telephone Encounter (Signed)
Called pt and spoke to her twice, spoke to dr rice, called to assist her in getting appt at [facility]. She will per dr rice come to [facility] tomorrow am for evaluation of odorous open wounds. Dr rice will speak to dr Evette Doffing and dr Heber [place]

## 2017-02-08 NOTE — Telephone Encounter (Signed)
Pt called back requesting to speak with a nurse about skin problem. Please call pt back.

## 2017-02-09 ENCOUNTER — Telehealth: Payer: Self-pay | Admitting: Dietician

## 2017-02-09 ENCOUNTER — Encounter: Payer: Self-pay | Admitting: Internal Medicine

## 2017-02-09 ENCOUNTER — Other Ambulatory Visit (HOSPITAL_COMMUNITY)
Admission: RE | Admit: 2017-02-09 | Discharge: 2017-02-09 | Disposition: A | Discharge status: Home / Self Care | Payer: Medicaid Other | Source: Ambulatory Visit | Attending: Student in an Organized Health Care Education/Training Program | Admitting: Student in an Organized Health Care Education/Training Program

## 2017-02-09 ENCOUNTER — Ambulatory Visit (INDEPENDENT_AMBULATORY_CARE_PROVIDER_SITE_OTHER): Payer: Medicaid Other | Admitting: Internal Medicine

## 2017-02-09 VITALS — BP 155/84 | HR 85 | Temp 99.0°F | Ht 65.0 in | Wt 178.0 lb

## 2017-02-09 DIAGNOSIS — L989 Disorder of the skin and subcutaneous tissue, unspecified: Secondary | ICD-10-CM

## 2017-02-09 DIAGNOSIS — E1169 Type 2 diabetes mellitus with other specified complication: Secondary | ICD-10-CM | POA: Diagnosis not present

## 2017-02-09 DIAGNOSIS — L98499 Non-pressure chronic ulcer of skin of other sites with unspecified severity: Secondary | ICD-10-CM

## 2017-02-09 DIAGNOSIS — Z21 Asymptomatic human immunodeficiency virus [HIV] infection status: Secondary | ICD-10-CM | POA: Diagnosis not present

## 2017-02-09 DIAGNOSIS — Z794 Long term (current) use of insulin: Secondary | ICD-10-CM | POA: Diagnosis not present

## 2017-02-09 DIAGNOSIS — E11622 Type 2 diabetes mellitus with other skin ulcer: Secondary | ICD-10-CM

## 2017-02-09 DIAGNOSIS — E669 Obesity, unspecified: Secondary | ICD-10-CM | POA: Diagnosis not present

## 2017-02-09 DIAGNOSIS — Z79899 Other long term (current) drug therapy: Secondary | ICD-10-CM | POA: Diagnosis not present

## 2017-02-09 DIAGNOSIS — L97819 Non-pressure chronic ulcer of other part of right lower leg with unspecified severity: Secondary | ICD-10-CM | POA: Diagnosis not present

## 2017-02-09 DIAGNOSIS — L88 Pyoderma gangrenosum: Secondary | ICD-10-CM | POA: Diagnosis not present

## 2017-02-09 DIAGNOSIS — L97829 Non-pressure chronic ulcer of other part of left lower leg with unspecified severity: Secondary | ICD-10-CM | POA: Diagnosis not present

## 2017-02-09 DIAGNOSIS — Z6829 Body mass index (BMI) 29.0-29.9, adult: Secondary | ICD-10-CM

## 2017-02-09 MED ORDER — INSULIN GLARGINE 100 UNIT/ML SOLOSTAR PEN: 30.0000 [IU] | PEN_INJECTOR | Freq: Every day | SUBCUTANEOUS | 5 refills | Status: DC

## 2017-02-09 MED ORDER — PREDNISONE 20 MG PO TABS: 60.0000 mg | ORAL_TABLET | Freq: Every day | ORAL | 0 refills | Status: DC

## 2017-02-09 NOTE — Telephone Encounter (Signed)
Steroid-Induced Hyperglycemia Prevention and Management Kathy Frank is a 41 y.o. female who meets criteria for [facility] quality improvement program (diabetes patient prescribed short course of steroids).  A/P Current Regimen  Patient prescribed prednisone 60 mg daily x 6 days, currently on day 0 of therapy. Patient advised to take prednisone in the AM  Prednisone indication: skin nodule on surface of skin  Current DM regimen lantus 15 units qam  Home BG Monitoring  Patient does have a meter at home and does check BG at home. Meter was not supplied.  CBGs at home unknown  CBGs prior to steroid course 211 A1C prior to steroid course 7.5 in February.   S/Sx of hyper- or hypoglycemia: none, none  Medication Management Her dose of lantus was increased to 30 units from 15 to cover the effect of the steroids.   Patient Education  Advised patient to monitor BG while on steroid therapy (at least twice daily prior to first 2 meals of the day).  Patient educated about signs/symptoms and advised to contact clinic if hyper- or hypoglycemic.  Patient did  verbalize understanding of information and regimen by repeating back topics discussed.  Follow-up thursday by phone  suggest appointment in 2 weeks  per protocol  Pieper Kasik, Butch Penny 2:47 PM 02/09/2017

## 2017-02-09 NOTE — Patient Instructions (Addendum)
For your rash Keep the appointment with the dermatologist on this Thursday Start taking prednisone 60 mg daily for 1 week  Your diabetes will be difficult to control during this time Please check your glucose throughout the day Please increase your Lantus dose to 30 units daily until you have completed taking the prednisone  Schedule an appointment for follow-up with our clinic in 1 week Call the clinic if you have uncontrolled bleeding over the site of the biopsy

## 2017-02-09 NOTE — Telephone Encounter (Signed)
I saw [name] in clinic on 6/26 and after discussion with Dr. Lynnae January did not have high confidence in any diagnosis at that time. She did not appear to be acutely ill and was uncomfortable but not in distress. Our plan was to refer her to dermatology for evaluation but she was found to be already established with an office in town and recommended to follow up with them. This was either not communicated adequately or otherwise did not happen yet.  Her case was discussed by phone with Dr. Tommy Medal as a phone infectious disease consultation who did not have a strong feeling evaluating images of the leg lesions but recommended considering vasculitis. We also repeated CD4 count and VL confirming well controlled HIV that should not be at high risk for atypical infections. Inflammatory markers were highly elevated but nonspecific with high sed rate and CRP.  Apparently her symptoms are now worse with drainage or foul odor to these lesions. I asked her to return as we might need to start treating more urgently or have new clinical information.

## 2017-02-09 NOTE — Progress Notes (Addendum)
CC: worsening leg rash  HPI: [name] is a 41 y.o. PMH CAD stage III, diabetes mellitus with history of gastroparesis and DKA, HIV, hypertension and GERD who presents with worsening ulcers over her lower extremities.  She was initially evaluated for this in the internal medicine center on June 26. At that time she had been on her left foot, left shin and right ankle. At that visit multiple autoimmune and infectious disease serologies were checked and dermatology referral was made. It was some complication in processing dermatology referral as she had multiple names in the Epic system. Lab showed a sedimentation rate of 85, ANA negative, MPO and PR3 ANCA below the detectable limit, RPR negative, CD4 count 390, HIV viral load 80 copies. Since that time the ulcer on the back of her right ankle has worsened and she now has multiple ulcers there, necrotic tissue, and peeling skin surrounding the area.   Past Medical History:  Diagnosis Date  . CKD (chronic kidney disease), stage III   . Cyclical vomiting    THC induced???  . Depression   . DKA (diabetic ketoacidoses) ([place]) 12/2014  . Erosive esophagitis   . Gastroparesis    ? diabetic  . GERD (gastroesophageal reflux disease)   . Human immunodeficiency virus (HIV) disease ([place]) dx'd 04/2016  . Hypertension   . Normocytic anemia   . Type II diabetes mellitus ([place]) 1994   diagnosed around 1994   Review of Systems:  Please refer to H&P and assessment and plan Pertinent Review of Systems, All Others Reviewed and Negative  Physical Exam:  Vitals:   02/09/17 0944  BP: (!) 155/84  Pulse: 85  Temp: 99 F (37.2 C)  TempSrc: Oral  SpO2: 99%  Weight: 178 lb (80.7 kg)  Height: 5\' 5"  (1.651 m)   Physical Exam  Constitutional: She is oriented to person, place, and time and well-developed, well-nourished, and in no distress. No distress.  HENT:  Head: Normocephalic and atraumatic.  Neurological: She is alert and oriented to person,  place, and time.  Skin: Skin is warm and dry. She is not diaphoretic. No erythema.  There is a ulcer over her left anterior tibia which has a central area of hardened purulent fluid Theres a similar lesion over her right medial ankle, just below the malleolus  There is a healing ulcer with visible base on her right midfoot, this was the original lesion  A group of 3 ulcers over her right posterior ankle, one of which is filled with necrottic tissue and peeling skin surrounding this group of lesions. Clear odorless fluid is draining.   There is no skin fluctuance or erythema around the lesions Multiple tattoos over all extremities            Assessment & Plan:   Ulcerative skin lesions  Patient presents with worsening ulcerative lesions over her lower extremities. She still has no systemic signs of infection. HIV viral load and CD4 count show well controlled infection making opportunistic infection less likely. With her negative ANA and ANCA labs this is less likely related to a vasculitis process. 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 We have clarified her referral to dermatology and she has an appointment scheduled for Thursday 7/5. She is very concerned about the rapid progression of these lesions and the pain associated so we were inclined to start her on prednisone for relief. We will perform a punch biopsy prior to  providing her with the prednisone dose.  - Follow up punch biopsy results  - provided Rx for prednisone 60 mg daily for one week  - return to clinic in 1 week for follow up   ADDENDUM 7/5: I called and spoke with the patient for follow-up, she says the wound from the biopsy is no longer bleeding but there is still clear drainage. She went to the dermatologist this morning and was prescribed an antibiotic and some cream. She does not know the name of this antibiotic but it will be delivered from her pharmacy  this afternoon.  ADDENDUM 7/9: Skin biopsy resulted as subacute spongiotic dermatitis eosinophils seen superficially in the dermis consistent with eczema vs contact dermatitis and the sample was sent on for further evaluation by dermatopathology. This differential is not consistent with her exam findings. I noticed that she does not have a follow up scheduled so I have asked the front desk to schedule her for follow up.   Diabetes  Last A1c was 7.5. Her blood glucose has been well controlled with Lantus 15 units every night and metformin 1000 mg daily. She has been hospitalized for diabetic ketoacidosis and is being started on a one-week prednisone prescription for possible pyoderma gangrenosum so she will need closer monitoring of blood glucose and an increase in her Lantus dose.  - increased lantus to 30 units daily for the next week  -Return to clinic in 1 week for follow-up  Addendum 02/11/2017: I called and spoke with the patient for follow up, she says that yesterday morning when she began taking the prednisone she took lantus 30 units that morning, at lunch her blood sugar was 400 and in the evening her blood sugar was 550 and she began using some of her daughters novolog. I told her to increase the dose of the lantus to 40 units qAM, continue checking her blood sugar, stop using the novolog, and schedule a follow up in 1 week. I have asked her to call the clinic if her blood sugars remain uncontrolled.   Punch Biopsy Procedure Note  Pre-operative Diagnosis: pyoderma gangrenosum  Locations:posterior right ankle   Anesthesia: Lidocaine 1% without epinephrine   Procedure Details  History of allergy to lidocaine: no Sensitivity to epinephrine: no  Patient informed of the risks (including bleeding and infection) and benefits of the  procedure and verbal informed consent obtained.  The lesion and surrounding area was prepped with an alcohol swab. The skin was then stretched perpendicular to  the skin tension lines and the lesion removed using the  59mmpunch. The resulting ellipse was then closed. Pressure was held until the wound stopped bleeding. Antibioticointment and a sterile dressing applied. The specimen was sent for pathologic examination. The patient tolerated the procedure well.  Condition: Stable  Complications: none.  Plan: 1. Instructed to keep the wound dry and covered for 24-48 hours and clean thereafter. 2. Patient instructed to apply Vaseline daily until healed. 3. Warning signs of infection were reviewed.    See Encounters Tab for problem based charting.  Patient seen with Dr. Evette Doffing

## 2017-02-11 MED ORDER — PREDNISONE 20 MG PO TABS: 60.0000 mg | ORAL_TABLET | Freq: Every day | ORAL | 0 refills | Status: AC

## 2017-02-11 NOTE — Telephone Encounter (Signed)
Seen in clinic 7/3.

## 2017-02-11 NOTE — Assessment & Plan Note (Addendum)
Patient presents with worsening ulcerative lesions over her lower extremities. She still has no systemic signs of infection. HIV viral load and CD4 count show well controlled infection making opportunistic infection less likely. With her negative ANA and ANCA labs this is less likely related to a vasculitis process. 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 We have clarified her referral to dermatology and she has an appointment scheduled for Thursday 7/5. She is very concerned about the rapid progression of these lesions and the pain associated so we were inclined to start her on prednisone for relief. We will perform a punch biopsy prior to providing her with the prednisone dose.  - Follow up punch biopsy results  - provided Rx for prednisone 60 mg daily for one week  - return to clinic in 1 week for follow up   ADDENDUM 7/5: I called and spoke with the patient for follow-up, she says that she went to the dermatologist this morning and was prescribed an antibiotic and some cream. She does not know the name of this antibiotic but over receiving it delivered from her pharmacy this afternoon.  ADDENDUM 7/9: Skin biopsy resulted as subacute spongiotic dermatitis eosinophils seen superficially in the dermis consistent with eczema vs contact dermatitis and the sample was sent on for further evaluation by dermatopathology. This differential is not consistent with her exam findings. I noticed that she does not have a follow up scheduled so I have asked the front desk to schedule her for follow up.

## 2017-02-11 NOTE — Telephone Encounter (Addendum)
Kathy Frank reports that her blood sugar was very high yesterday after starting the steroids and taking 30 units of lantus. It was 539 at the highest before she began correcting  It with extra Novolog. She says she spoke to Dr. Hetty Ely today who told her to increase her lantus to 40 units. She had already taken the 30 and was not sure if she should take the extra 10 now or not. I told her she could take the other 10 units today per Dr. Fredrik Cove order as long as her blood sugars are more than 200 mg/dl and take 40 units lantus tomorrow on future steroid days.  P: Follow up call tomorrow afternoon.

## 2017-02-11 NOTE — Assessment & Plan Note (Addendum)
Last A1c was 7.5. Her blood glucose has been well controlled with Lantus 15 units every night and metformin 1000 mg daily. She has been hospitalized for diabetic ketoacidosis and is being started on a one-week prednisone prescription for possible pyoderma gangrenosum so she will need closer monitoring of blood glucose and an increase in her Lantus dose.  - increased lantus to 30 units daily for the next week  -Return to clinic in 1 week for follow-up  Addendum 02/11/2017: I called and spoke with the patient this morning for follow up, she says that yesterday morning when she began taking the prednisone she took lantus 30 units that morning, at lunch her blood sugar was 400 and in the evening her blood sugar was 550 and she began using some of her daughters novolog. I told her to increase the dose of the lantus to 40 units qAM, continue checking her blood sugar, stop using the novolog, and schedule a follow up in 1 week. I have asked her to call the clinic if her blood sugars remain uncontrolled.

## 2017-02-11 NOTE — Progress Notes (Signed)
Internal Medicine Clinic Attending  I saw and evaluated the patient.  I personally confirmed the key portions of the history and exam documented by Dr. Hetty Frank and I reviewed pertinent patient test results.  The assessment, diagnosis, and plan were formulated together and I agree with the documentation in the resident's note. I was present for the entirety of the procedure.   To me these lesions look consistent with pyoderma. The are deep ulcers, well demarcated with undermined borders, there is purulence in the ulcer, and they are distributed on both legs up to the shin below the knee. She has no signs of venous stasis. There was minimal erythema of the skin and no foul odor, so I doubt significant cellulitis. Given her level of pain, we offered her a steroid trial. We discussed how this would affect her blood sugars and that she would need to increase insulin dosing. She was agreeable to starting prednisone, declined tramadol for pain management. She will also be evaluated by dermatology this week and we will see if they concur with steroids.

## 2017-02-11 NOTE — Telephone Encounter (Signed)
Thank you, yes I agree with this plan

## 2017-02-12 NOTE — Telephone Encounter (Signed)
Calling to follow up on blood sugars while on steroids:  Today is day 3 of 6 days. I left a message asking Kathy Frank to return the call and leave blood sugar values on voicemail if I do not answer.

## 2017-02-26 ENCOUNTER — Encounter: Payer: Self-pay | Admitting: Internal Medicine

## 2017-02-26 ENCOUNTER — Ambulatory Visit (INDEPENDENT_AMBULATORY_CARE_PROVIDER_SITE_OTHER): Payer: Medicaid Other | Admitting: Internal Medicine

## 2017-02-26 ENCOUNTER — Ambulatory Visit (HOSPITAL_COMMUNITY)
Admission: RE | Admit: 2017-02-26 | Discharge: 2017-02-26 | Disposition: A | Discharge status: Home / Self Care | Payer: Medicaid Other | Source: Ambulatory Visit | Attending: Oncology | Admitting: Oncology

## 2017-02-26 VITALS — BP 128/77 | HR 86 | Temp 98.3°F | Ht 65.0 in | Wt 189.6 lb

## 2017-02-26 DIAGNOSIS — M7989 Other specified soft tissue disorders: Secondary | ICD-10-CM | POA: Insufficient documentation

## 2017-02-26 DIAGNOSIS — Z87891 Personal history of nicotine dependence: Secondary | ICD-10-CM

## 2017-02-26 DIAGNOSIS — L98499 Non-pressure chronic ulcer of skin of other sites with unspecified severity: Secondary | ICD-10-CM | POA: Diagnosis not present

## 2017-02-26 HISTORY — DX: Other specified soft tissue disorders: M79.89

## 2017-02-26 NOTE — Patient Instructions (Addendum)
Kathy Frank,   The scan of your legs did not show a blood clot. Try using compression stockings for the swelling in your legs.   Please have the dermatologist that you are seeing fax their records to our office Fax: [phone]  Please call us if you need anything [phone]   Schedule a follow up appointment with Dr. Maricela Bo on either august 17th or 24th

## 2017-02-26 NOTE — Assessment & Plan Note (Signed)
Patient presents with weeks of left leg swelling associated with pain in her ankle. The swelling was present at her last office visit on 7/3 but it has become worse and is now associated with the pain. He denies trauma to the right ankle. No prior history of DVTs. Denies shortness of breath or chest pain. She denies recent travel. She describes a sedentary lifestyle. Examination of the right ankle is concerning for a deep venous thrombosis. She has a history of cellulitis over this area  - Ordered STAT DVT Doppler study today

## 2017-02-26 NOTE — Progress Notes (Signed)
CC: swollen leg   HPI:  Ms.Antoine C Gren is a 41 y.o. with PMH as listed below who presents for follow up/ acute concern of left ankle and leg swelling left ankle and leg swelling. Please see the assessment and plans for the status of the patient chronic medical problems.    Past Medical History:  Diagnosis Date  . CKD (chronic kidney disease), stage III   . Cyclical vomiting    THC induced???  . Depression   . DKA (diabetic ketoacidoses) ([place]) 12/2014  . Erosive esophagitis   . Gastroparesis    ? diabetic  . GERD (gastroesophageal reflux disease)   . Human immunodeficiency virus (HIV) disease ([place]) dx'd 04/2016  . Hypertension   . Normocytic anemia   . Type II diabetes mellitus ([place]) 1994   diagnosed around 1994   Review of Systems:  Refer to history of present illness and assessment and plans for pertinent review of systems, all others reviewed and negative  Physical Exam:  Vitals:   02/26/17 1031  BP: 128/77  Pulse: 86  Temp: 98.3 F (36.8 C)  TempSrc: Oral  SpO2: 100%  Weight: 189 lb 9.6 oz (86 kg)  Height: 5\' 5"  (1.651 m)   Physical Exam  Constitutional: She is oriented to person, place, and time.  HENT:  Head: Normocephalic and atraumatic.  Cardiovascular: Normal rate and regular rhythm.   No murmur heard. Peripheral pulses intact bilateral  2+ pitting edema in right lower leg, trace pitting edema over left lower leg. Right calf diameter is 1.5 cm larger than left. Warmth to palpation of the right lower leg.   No tenderness to palpation over right posterior calf. No tenderness with dorsiflexion of the right ankle.   Pulmonary/Chest: Effort normal and breath sounds normal. No respiratory distress. She has no wheezes. She has no rales.  Neurological: She is alert and oriented to person, place, and time.  Skin: Skin is warm and dry.  Well healing ulcers over right ankle and left shin, no surrounding erythema or purulent drainage   Psychiatric: Affect and  judgment normal.   Assessment & Plan:   Left leg swelling Patient presents with weeks of left leg swelling associated with pain in her ankle. The swelling was present at her last office visit on 7/3 but it has become worse and is now associated with the pain. He denies trauma to the right ankle. No prior history of DVTs. Denies shortness of breath or chest pain. She denies recent travel. She describes a sedentary lifestyle. Examination of the right ankle is concerning for a deep venous thrombosis. She has a history of cellulitis over this area  - Ordered STAT DVT Doppler study today, this was negative for DVT - She has good peripheral pulses so we will measure for and provide her with compression stockings   Ulcerative skin lesions  She was previously seen in the clinic 2 weeks ago for an ulcerative skin lesion at that time it was thought that this was related to pyoderma gangrenosum and she was prescribed prednisone for 1 week. Skin biopsies were not revealing. She followed up with dermatology the following day and was prescribed antibiotics at that time. Yesterday again she followed up with dermatology and they prescribed additional antibiotics. The plan for a repeat biopsy in the near future. Assessment on my exam today the skin lesion looks much better I'm not sure if this is due to the steroids or antibiotics. - Dermatology is following we appreciate their  recommendations  See Encounters Tab for problem based charting.  Patient seen with Dr. Beryle Beams

## 2017-02-26 NOTE — Progress Notes (Signed)
Medicine attending: Medical history, presenting problems, physical findings, and medications, reviewed with resident physician Dr Nina Blum on the day of the patient visit and I concur with her evaluation and management plan. 

## 2017-02-26 NOTE — Assessment & Plan Note (Signed)
She was previously seen in the clinic 2 weeks ago for an ulcerative skin lesion at that time it was thought that this was related to pyoderma gangrenosum and she was prescribed prednisone for 1 week. Skin biopsies were not revealing. She followed up with dermatology the following day and was prescribed antibiotics at that time. Yesterday again she followed up with dermatology and they prescribed additional antibiotics. The plan for a repeat biopsy in the near future. Assessment on my exam today the skin lesion looks much better I'm not sure if this is due to the steroids or antibiotics. - Dermatology is following we appreciate their recommendations, requested that the patient have these records sent to [place] from her dermatologist

## 2017-03-08 ENCOUNTER — Telehealth: Payer: Self-pay | Admitting: Dietician

## 2017-03-08 DIAGNOSIS — E1169 Type 2 diabetes mellitus with other specified complication: Secondary | ICD-10-CM

## 2017-03-08 DIAGNOSIS — E669 Obesity, unspecified: Principal | ICD-10-CM

## 2017-03-08 NOTE — Telephone Encounter (Signed)
Called to follow up to blood sugar control after steroid use: she decreased her lantus back to 15 units lantus one time a day, fasting blood sugars now 80-115. Her wounds look better. Encouraged her to call for questions or concerns.

## 2017-03-14 ENCOUNTER — Encounter (HOSPITAL_COMMUNITY): Payer: Self-pay | Admitting: Nurse Practitioner

## 2017-03-14 ENCOUNTER — Emergency Department (HOSPITAL_COMMUNITY)
Admission: EM | Admit: 2017-03-14 | Discharge: 2017-03-15 | Disposition: A | Discharge status: Home / Self Care | Payer: Medicaid Other | Attending: Emergency Medicine | Admitting: Emergency Medicine

## 2017-03-14 DIAGNOSIS — I129 Hypertensive chronic kidney disease with stage 1 through stage 4 chronic kidney disease, or unspecified chronic kidney disease: Secondary | ICD-10-CM | POA: Diagnosis not present

## 2017-03-14 DIAGNOSIS — E1121 Type 2 diabetes mellitus with diabetic nephropathy: Secondary | ICD-10-CM | POA: Insufficient documentation

## 2017-03-14 DIAGNOSIS — R112 Nausea with vomiting, unspecified: Secondary | ICD-10-CM | POA: Diagnosis present

## 2017-03-14 DIAGNOSIS — E1122 Type 2 diabetes mellitus with diabetic chronic kidney disease: Secondary | ICD-10-CM | POA: Diagnosis not present

## 2017-03-14 DIAGNOSIS — Z87891 Personal history of nicotine dependence: Secondary | ICD-10-CM | POA: Insufficient documentation

## 2017-03-14 DIAGNOSIS — N183 Chronic kidney disease, stage 3 (moderate): Secondary | ICD-10-CM | POA: Diagnosis not present

## 2017-03-14 DIAGNOSIS — K3184 Gastroparesis: Secondary | ICD-10-CM | POA: Diagnosis not present

## 2017-03-14 DIAGNOSIS — Z7984 Long term (current) use of oral hypoglycemic drugs: Secondary | ICD-10-CM | POA: Insufficient documentation

## 2017-03-14 DIAGNOSIS — Z79899 Other long term (current) drug therapy: Secondary | ICD-10-CM | POA: Diagnosis not present

## 2017-03-14 LAB — BASIC METABOLIC PANEL
Anion gap: 11 (ref 5–15)
BUN: 23 mg/dL — ABNORMAL HIGH (ref 6–20)
CO2: 18 mmol/L — ABNORMAL LOW (ref 22–32)
Calcium: 8.2 mg/dL — ABNORMAL LOW (ref 8.9–10.3)
Chloride: 110 mmol/L (ref 101–111)
Creatinine, Ser: 2.52 mg/dL — ABNORMAL HIGH (ref 0.44–1.00)
GFR calc Af Amer: 26 mL/min — ABNORMAL LOW (ref >60)
GFR calc non Af Amer: 23 mL/min — ABNORMAL LOW (ref >60)
Glucose, Bld: 197 mg/dL — ABNORMAL HIGH (ref 65–99)
Potassium: 3.6 mmol/L (ref 3.5–5.1)
Sodium: 139 mmol/L (ref 135–145)

## 2017-03-14 LAB — COMPREHENSIVE METABOLIC PANEL
ALT: 12 U/L — ABNORMAL LOW (ref 14–54)
AST: 18 U/L (ref 15–41)
Albumin: 3.7 g/dL (ref 3.5–5.0)
Alkaline Phosphatase: 89 U/L (ref 38–126)
Anion gap: 15 (ref 5–15)
BUN: 27 mg/dL — ABNORMAL HIGH (ref 6–20)
CO2: 18 mmol/L — ABNORMAL LOW (ref 22–32)
Calcium: 9.2 mg/dL (ref 8.9–10.3)
Chloride: 107 mmol/L (ref 101–111)
Creatinine, Ser: 2.71 mg/dL — ABNORMAL HIGH (ref 0.44–1.00)
GFR calc Af Amer: 24 mL/min — ABNORMAL LOW (ref >60)
GFR calc non Af Amer: 21 mL/min — ABNORMAL LOW (ref >60)
Glucose, Bld: 300 mg/dL — ABNORMAL HIGH (ref 65–99)
Potassium: 4.2 mmol/L (ref 3.5–5.1)
Sodium: 140 mmol/L (ref 135–145)
Total Bilirubin: 0.6 mg/dL (ref 0.3–1.2)
Total Protein: 8.9 g/dL — ABNORMAL HIGH (ref 6.5–8.1)

## 2017-03-14 LAB — URINALYSIS, ROUTINE W REFLEX MICROSCOPIC
Bilirubin Urine: NEGATIVE
Glucose, UA: 250 mg/dL — AB
Ketones, ur: 15 mg/dL — AB
Leukocytes, UA: NEGATIVE
Nitrite: NEGATIVE
Protein, ur: >300 mg/dL — AB
Specific Gravity, Urine: 1.02 (ref 1.005–1.030)
pH: 7 (ref 5.0–8.0)

## 2017-03-14 LAB — URINALYSIS, MICROSCOPIC (REFLEX)

## 2017-03-14 LAB — CBC
HCT: 33.6 % — ABNORMAL LOW (ref 36.0–46.0)
Hemoglobin: 11.3 g/dL — ABNORMAL LOW (ref 12.0–15.0)
MCH: 30.5 pg (ref 26.0–34.0)
MCHC: 33.6 g/dL (ref 30.0–36.0)
MCV: 90.8 fL (ref 78.0–100.0)
Platelets: 344 10*3/uL (ref 150–400)
RBC: 3.7 MIL/uL — ABNORMAL LOW (ref 3.87–5.11)
RDW: 15.1 % (ref 11.5–15.5)
WBC: 8.5 10*3/uL (ref 4.0–10.5)

## 2017-03-14 LAB — LIPASE, BLOOD: Lipase: 33 U/L (ref 11–51)

## 2017-03-14 LAB — PREGNANCY, URINE: Preg Test, Ur: NEGATIVE

## 2017-03-14 MED ORDER — HALOPERIDOL LACTATE 5 MG/ML IJ SOLN
2.5000 mg | Freq: Once | INTRAMUSCULAR | Status: AC
  Administered 2017-03-14: 2.5 mg via INTRAMUSCULAR
  Filled 2017-03-14: qty 1

## 2017-03-14 MED ORDER — SODIUM CHLORIDE 0.9 % IV BOLUS (SEPSIS)
1000.0000 mL | Freq: Once | INTRAVENOUS | Status: AC
  Administered 2017-03-14: 1000 mL via INTRAVENOUS

## 2017-03-14 MED ORDER — METOCLOPRAMIDE HCL 5 MG/ML IJ SOLN
10.0000 mg | Freq: Once | INTRAMUSCULAR | Status: AC
  Administered 2017-03-14: 10 mg via INTRAVENOUS
  Filled 2017-03-14: qty 2

## 2017-03-14 MED ORDER — PANTOPRAZOLE SODIUM 40 MG IV SOLR
40.0000 mg | Freq: Once | INTRAVENOUS | Status: AC
  Administered 2017-03-14: 40 mg via INTRAVENOUS
  Filled 2017-03-14: qty 40

## 2017-03-14 NOTE — ED Notes (Signed)
Pt bolus running slowly due to IV size and placement. Placed on pump at 600 ml/hr. No redness, swelling, or pain to the site. BMP to be drawn AFTER second bolus complete.

## 2017-03-14 NOTE — ED Notes (Signed)
ED Provider at bedside. 

## 2017-03-14 NOTE — ED Notes (Signed)
Collect bmp after 2nd bolus around 2230

## 2017-03-14 NOTE — ED Notes (Signed)
MD notified of patient's request for pain medication - see new orders.

## 2017-03-14 NOTE — ED Triage Notes (Signed)
Pt presents with c/o vomiting. She's had continuous nausea, vomiting, diarrhea since last night. She has been unable to hold any oral intake down including her daily medications. She has been trying to drink water, gatorade, ginger ale but continues to vomit.

## 2017-03-14 NOTE — ED Provider Notes (Signed)
[facility] DEPT Provider Note   CSN: [id number] Arrival date & time: 03/14/17  1721     History   Chief Complaint Chief Complaint  Patient presents with  . Emesis    HPI Kathy Frank is a 41 y.o. female.  The history is provided by the patient.  Emesis   This is a new problem. The current episode started 12 to 24 hours ago. The problem has not changed since onset.The emesis has an appearance of stomach contents. There has been no fever. Associated symptoms include abdominal pain and chills. Pertinent negatives include no cough, no diarrhea and no fever.   41 year old female who presents with nausea and vomiting. She has a history of type 2 diabetes complicated by recurrent gastroparesis and cyclical vomiting, and chronic kidney disease. States that she had onset of nonbloody emesis that woke her up from sleep yesterday evening. States it feels similar to previous episodes of gastroparesis. Endorses associated upper abdominal pain. Did have some small loose stool yesterday, but today had a normal bowel movement. No fevers but having chills. No chest pain or difficulty breathing.   Past Medical History:  Diagnosis Date  . CKD (chronic kidney disease), stage III   . Cyclical vomiting    THC induced???  . Depression   . DKA (diabetic ketoacidoses) ([place]) 12/2014  . Erosive esophagitis   . Gastroparesis    ? diabetic  . GERD (gastroesophageal reflux disease)   . Human immunodeficiency virus (HIV) disease ([place]) dx'd 04/2016  . Hypertension   . Normocytic anemia   . Type II diabetes mellitus ([place]) 1994   diagnosed around 1994    Patient Active Problem List   Diagnosis Date Noted  . Leg swelling 02/26/2017  . Right leg swelling 02/26/2017  . Superficial ulcerative lesion ([place]) 02/02/2017  . Constipation 12/03/2016  . Nausea & vomiting 11/22/2016  . Acute-on-chronic kidney injury ([place]) 11/14/2016  . Aortic atherosclerosis ([place]) 09/30/2016  . Orthostatic hypotension  09/30/2016  . Hypokalemia 09/30/2016  . Diabetic foot ulcer associated with diabetes mellitus due to underlying condition ([place]) 07/08/2016  . CKD (chronic kidney disease) stage 3, GFR 30-59 ml/min 06/19/2016  . Human immunodeficiency virus (HIV) disease ([place]) 04/23/2016  . Contact dermatitis 04/21/2016  . Moderate nonproliferative diabetic retinopathy of both eyes ([place]) 11/21/2014  . Esophageal reflux   . Microscopic hematuria 10/31/2014  . Essential hypertension 11/16/2013  . Diabetic nephropathy ([place]) 12/07/2012  . TOBACCO USER 06/28/2006  . Depression 06/28/2006  . Diabetes mellitus type 2 in obese [facility]) 06/28/1994    Past Surgical History:  Procedure Laterality Date  . TUBAL LIGATION  2002    OB History    No data available       Home Medications    Prior to Admission medications   Medication Sig Start Date End Date Taking? Authorizing Provider  amLODipine (NORVASC) 10 MG tablet Take 1 tablet (10 mg total) by mouth daily. 12/02/16  Yes Rivet, Sindy [place], MD  dolutegravir (TIVICAY) 50 MG tablet Take 1 tablet (50 mg total) by mouth daily. 07/08/16  Yes Campbell Riches, MD  emtricitabine-tenofovir AF (DESCOVY) 200-25 MG tablet Take 1 tablet by mouth daily. 07/08/16  Yes Campbell Riches, MD  Insulin Glargine (LANTUS) 100 UNIT/ML Solostar Pen Inject 30 Units into the skin daily at 10 pm. Patient taking differently: Inject 15 Units into the skin daily at 10 pm.  02/09/17  Yes Ledell Noss, MD  metFORMIN (GLUCOPHAGE) 1000 MG tablet Take 1 tablet (1,000  mg total) by mouth daily with breakfast. 05/22/16  Yes Riccardo Dubin, MD  metoCLOPramide (REGLAN) 5 MG tablet Take 1 tablet (5 mg total) by mouth 3 (three) times daily before meals. Patient taking differently: Take 5 mg by mouth 3 (three) times daily as needed for nausea or vomiting.  12/24/16  Yes Riccardo Dubin, MD  ondansetron (ZOFRAN-ODT) 4 MG disintegrating tablet Take 1 tablet (4 mg total) by mouth every 8 (eight) hours as  needed for nausea or vomiting. 12/24/16  Yes Riccardo Dubin, MD  pantoprazole (PROTONIX) 40 MG tablet TAKE ONE (1) TABLET BY MOUTH EVERY DAY Patient taking differently: TAKE ONE (1) TABLET BY MOUTH EVERY DAY AS NEEDED FOR ACID REFLUX 12/02/16  Yes Riccardo Dubin, MD  sennosides-docusate sodium (SENOKOT-S) 8.6-50 MG tablet Take 1 tablet by mouth daily. Patient taking differently: Take 1 tablet by mouth daily as needed for constipation.  12/02/16  Yes Rivet, Carly J, MD  sodium bicarbonate 650 MG tablet TAKE ONE (1) TABLET BY MOUTH TWO (2) TIMES DAILY 12/02/16  Yes Riccardo Dubin, MD  traZODone (DESYREL) 50 MG tablet Take 2 tablets (100 mg total) by mouth at bedtime. Patient taking differently: Take 100 mg by mouth at bedtime as needed for sleep.  10/06/16  Yes Milagros Loll, MD  triamcinolone cream (KENALOG) 0.1 % Apply 1 application topically 2 (two) times daily. Patient taking differently: Apply 1 application topically 2 (two) times daily as needed (irritation).  03/27/16  Yes Mabe, Shanon Brow, NP  ondansetron (ZOFRAN ODT) 4 MG disintegrating tablet Take 1 tablet (4 mg total) by mouth every 8 (eight) hours as needed for nausea or vomiting. 03/15/17   Forde Dandy, MD    Family History Family History  Problem Relation Age of Onset  . Diabetes Mother   . Diabetes Brother   . Diabetes Daughter   . Diabetes Daughter   . Mental retardation Brother        died from PNA    Social History Social History  Substance Use Topics  . Smoking status: Former Smoker    Packs/day: 0.10    Years: 10.00    Types: Cigarettes    Start date: 11/17/2013    Quit date: 09/09/2014  . Smokeless tobacco: Never Used  . Alcohol use Yes     Comment: 09/30/2016  "maybe birthdays or holidays; very occasional"     Allergies   Patient has no known allergies.   Review of Systems Review of Systems  Constitutional: Positive for chills. Negative for fever.  Respiratory: Negative for cough.   Gastrointestinal:  Positive for abdominal pain and vomiting. Negative for diarrhea.  All other systems reviewed and are negative.    Physical Exam Updated Vital Signs BP (!) 176/96   Pulse (!) 104   Temp 98.9 F (37.2 C) (Oral)   Resp 16   LMP 02/18/2017   SpO2 99%   Physical Exam Physical Exam  Nursing note and vitals reviewed. Constitutional: Well developed, well nourished, non-toxic, and in no acute distress Head: Normocephalic and atraumatic.  Mouth/Throat: Oropharynx is clear and mildly dry mucous membranes.  Neck: Normal range of motion. Neck supple.  Cardiovascular: Near tachycardic rate and regular rhythm.   Pulmonary/Chest: Effort normal and breath sounds normal.  Abdominal: Soft. There is mild epigastric tenderness. There is no rebound and no guarding.  Musculoskeletal: Normal range of motion.  Neurological: Alert, no facial droop, fluent speech, moves all extremities symmetrically Skin: Skin is warm and dry.  Psychiatric: Cooperative   ED Treatments / Results  Labs (all labs ordered are listed, but only abnormal results are displayed) Labs Reviewed  COMPREHENSIVE METABOLIC PANEL - Abnormal; Notable for the following:       Result Value   CO2 18 (*)    Glucose, Bld 300 (*)    BUN 27 (*)    Creatinine, Ser 2.71 (*)    Total Protein 8.9 (*)    ALT 12 (*)    GFR calc non Af Amer 21 (*)    GFR calc Af Amer 24 (*)    All other components within normal limits  CBC - Abnormal; Notable for the following:    RBC 3.70 (*)    Hemoglobin 11.3 (*)    HCT 33.6 (*)    All other components within normal limits  URINALYSIS, ROUTINE W REFLEX MICROSCOPIC - Abnormal; Notable for the following:    Glucose, UA 250 (*)    Hgb urine dipstick MODERATE (*)    Ketones, ur 15 (*)    Protein, ur >300 (*)    All other components within normal limits  URINALYSIS, MICROSCOPIC (REFLEX) - Abnormal; Notable for the following:    Bacteria, UA FEW (*)    Squamous Epithelial / LPF TOO NUMEROUS TO COUNT  (*)    All other components within normal limits  BASIC METABOLIC PANEL - Abnormal; Notable for the following:    CO2 18 (*)    Glucose, Bld 197 (*)    BUN 23 (*)    Creatinine, Ser 2.52 (*)    Calcium 8.2 (*)    GFR calc non Af Amer 23 (*)    GFR calc Af Amer 26 (*)    All other components within normal limits  LIPASE, BLOOD  PREGNANCY, URINE    EKG  EKG Interpretation  Date/Time:  Sunday March 14 2017 18:38:15 EDT Ventricular Rate:  110 PR Interval:    QRS Duration: 86 QT Interval:  336 QTC Calculation: 455 R Axis:   84 Text Interpretation:  Sinus tachycardia Biatrial enlargement similar to previous EKG  Confirmed by Brantley Stage [phone]) on 03/14/2017 7:27:30 PM Also confirmed by Brantley Stage [phone]), editor Hattie Perch ([id number])  on 03/15/2017 7:10:04 AM       Radiology No results found.  Procedures Procedures (including critical care time)  Medications Ordered in ED Medications  sodium chloride 0.9 % bolus 1,000 mL (0 mLs Intravenous Stopped 03/14/17 2103)  metoCLOPramide (REGLAN) injection 10 mg (10 mg Intravenous Given 03/14/17 1831)  pantoprazole (PROTONIX) injection 40 mg (40 mg Intravenous Given 03/14/17 1830)  sodium chloride 0.9 % bolus 1,000 mL (0 mLs Intravenous Stopped 03/14/17 2242)  haloperidol lactate (HALDOL) injection 2.5 mg (2.5 mg Intramuscular Given 03/14/17 1905)     Initial Impression / Assessment and Plan / ED Course  I have reviewed the triage vital signs and the nursing notes.  Pertinent labs & imaging results that were available during my care of the patient were reviewed by me and considered in my medical decision making (see chart for details).     Presenting with nausea and vomiting which she states is similar to previous episodes of gastroparesis. She is mildly tachycardic and dry on exam. Has a soft and benign abdomen. Minimal epigastric tenderness noted. Do not suspect serious intra-abdominal process.  Given IV fluids, Reglan, Protonix and  Haldol. This improves her symptoms. Her blood work shows hyperglycemia with mild anion gap of 15 and bicarb of 18. Mild early DKA  versus dehydration. Repeated BMP after 2L of IVF. AG resolved, mildly low bicarb of 18. She does feel improved. Doubt dka. She is tolerating PO intake, and requests discharge home. Strict return and follow-up instructions reviewed. She expressed understanding of all discharge instructions and felt comfortable with the plan of care.   Final Clinical Impressions(s) / ED Diagnoses   Final diagnoses:  Gastroparesis    New Prescriptions Discharge Medication List as of 03/15/2017 12:07 AM    START taking these medications   Details  !! ondansetron (ZOFRAN ODT) 4 MG disintegrating tablet Take 1 tablet (4 mg total) by mouth every 8 (eight) hours as needed for nausea or vomiting., Starting Mon 03/15/2017, Print     !! - Potential duplicate medications found. Please discuss with provider.       Forde Dandy, MD 03/17/17 [phone]

## 2017-03-14 NOTE — Discharge Instructions (Addendum)
Keep well hydrated at home. Return for worsening symptoms, including fever, intractable vomiting, escalating pain, confusion or any other symptoms concerning to you.

## 2017-03-15 MED ORDER — ONDANSETRON 4 MG PO TBDP: 4.0000 mg | ORAL_TABLET | Freq: Three times a day (TID) | ORAL | 0 refills | Status: DC | PRN

## 2017-03-24 ENCOUNTER — Ambulatory Visit: Payer: Medicaid Other | Admitting: Infectious Diseases

## 2017-03-26 ENCOUNTER — Encounter: Payer: Medicaid Other | Admitting: Internal Medicine

## 2017-04-06 ENCOUNTER — Other Ambulatory Visit: Payer: Self-pay | Admitting: *Deleted

## 2017-04-06 DIAGNOSIS — I10 Essential (primary) hypertension: Secondary | ICD-10-CM

## 2017-04-06 MED ORDER — AMLODIPINE BESYLATE 10 MG PO TABS: 10.0000 mg | ORAL_TABLET | Freq: Every day | ORAL | 2 refills | Status: DC

## 2017-04-23 ENCOUNTER — Other Ambulatory Visit: Payer: Self-pay | Admitting: Infectious Diseases

## 2017-05-17 ENCOUNTER — Ambulatory Visit (INDEPENDENT_AMBULATORY_CARE_PROVIDER_SITE_OTHER): Payer: Medicaid Other | Admitting: Licensed Clinical Social Worker

## 2017-05-17 ENCOUNTER — Ambulatory Visit (INDEPENDENT_AMBULATORY_CARE_PROVIDER_SITE_OTHER): Payer: Medicaid Other | Admitting: Infectious Diseases

## 2017-05-17 ENCOUNTER — Encounter: Payer: Self-pay | Admitting: Infectious Diseases

## 2017-05-17 VITALS — BP 164/109 | HR 87 | Temp 98.3°F | Wt 177.0 lb

## 2017-05-17 DIAGNOSIS — F17200 Nicotine dependence, unspecified, uncomplicated: Secondary | ICD-10-CM

## 2017-05-17 DIAGNOSIS — I10 Essential (primary) hypertension: Secondary | ICD-10-CM

## 2017-05-17 DIAGNOSIS — E669 Obesity, unspecified: Secondary | ICD-10-CM

## 2017-05-17 DIAGNOSIS — F321 Major depressive disorder, single episode, moderate: Secondary | ICD-10-CM

## 2017-05-17 DIAGNOSIS — F32A Depression, unspecified: Secondary | ICD-10-CM

## 2017-05-17 DIAGNOSIS — B20 Human immunodeficiency virus [HIV] disease: Secondary | ICD-10-CM

## 2017-05-17 DIAGNOSIS — Z23 Encounter for immunization: Secondary | ICD-10-CM

## 2017-05-17 DIAGNOSIS — Z113 Encounter for screening for infections with a predominantly sexual mode of transmission: Secondary | ICD-10-CM

## 2017-05-17 DIAGNOSIS — N183 Chronic kidney disease, stage 3 unspecified: Secondary | ICD-10-CM

## 2017-05-17 DIAGNOSIS — E1169 Type 2 diabetes mellitus with other specified complication: Secondary | ICD-10-CM

## 2017-05-17 DIAGNOSIS — Z79899 Other long term (current) drug therapy: Secondary | ICD-10-CM

## 2017-05-17 DIAGNOSIS — F329 Major depressive disorder, single episode, unspecified: Secondary | ICD-10-CM

## 2017-05-17 MED ORDER — SERTRALINE HCL 25 MG PO TABS: 25.0000 mg | ORAL_TABLET | Freq: Every day | ORAL | 3 refills | Status: DC

## 2017-05-17 NOTE — Progress Notes (Signed)
   Subjective:    Patient ID: Kathy Frank, female    DOB: [birth year], 41 y.o.   MRN: [id number]  HPI 41 yo F with hx of HIV+ since 2017. She has been on genvoya.  She was also dx with Dm2 and had a DM foot infection.  Her course has been further complicated by nephropathy/CKD3.  She was in hospital 2-21 to 10-01-16 for orthostatic hypotension/AKI due to n/v.  Her art was changed to dtgv/descovy. She denies problems with taking this.  Has since been back to hospital with n/v.  Today states her sugars have been good. Her n/v has been better. Not sure why- diet? Believes last A1C was 7.1%  HIV 1 RNA Quant (copies/mL)  Date Value  10/07/2016 <20 NOT DETECTED  05/27/2016 25 (H)   HIV-1 RNA Viral Load (copies/mL)  Date Value  02/02/2017 80   CD4 T Cell Abs (/uL)  Date Value  02/02/2017 390 (L)  10/07/2016 430  05/27/2016 280 (L)   States she had ulcers on her skin and ankles. Has had several bx. Cream's have not worked. She had Bx 03-2017 showed "acute spongiotic dermatitis".   Review of Systems  Constitutional: Negative for appetite change, chills, fever and unexpected weight change.  Eyes: Negative for visual disturbance.  Respiratory: Negative for cough and shortness of breath.   Gastrointestinal: Positive for constipation. Negative for diarrhea.  Genitourinary: Negative for difficulty urinating.  Musculoskeletal: Negative for back pain.  Skin: Positive for rash.  Neurological: Negative for headaches.  Psychiatric/Behavioral: Negative for dysphoric mood.  vision worse at night. Has not seen ophtho this year.      Objective:   Physical Exam  Constitutional: She appears well-developed and well-nourished.  HENT:  Mouth/Throat: No oropharyngeal exudate.  Eyes: Pupils are equal, round, and reactive to light. EOM are normal.  Neck: Neck supple.  Cardiovascular: Normal rate, regular rhythm and normal heart sounds.   Pulses:      Dorsalis pedis pulses are 3+ on the right  side, and 3+ on the left side.  Pulmonary/Chest: Effort normal and breath sounds normal.  Abdominal: Soft. Bowel sounds are normal. There is no tenderness. There is no rebound.  Musculoskeletal: She exhibits no edema.  Lymphadenopathy:    She has no cervical adenopathy.  Neurological: No sensory deficit.  scattered areas of superficial ulcers, scabs.     Assessment & Plan:

## 2017-05-17 NOTE — Assessment & Plan Note (Signed)
Taking her art Not smoking tobacco Smoking THC, helps with nausea.  Will check her labs at f/u visit Flu shot today.  Offered/refused condoms.  rtc in 6 months

## 2017-05-17 NOTE — Assessment & Plan Note (Signed)
Will f/u with PCP and at her next lab draw- 6 months

## 2017-05-17 NOTE — Addendum Note (Signed)
Addended by: Jodean Valade C on: 05/17/2017 10:49 AM   Modules accepted: Orders

## 2017-05-17 NOTE — Assessment & Plan Note (Signed)
By her hx doing well Will f/u with IMT[facility], greatly appreciate partnering with them

## 2017-05-17 NOTE — BH Specialist Note (Signed)
Integrated Behavioral Health Follow Up Visit  MRN: [id number] Name: Kathy Frank  Number of [place] Clinician visits: 1/6 Session Start time: 10:32 am  Session End time: 10:50 am Total time: 18 mins  Type of Service: [place] Interpretor:No. Interpretor Name and Language: N/A  SUBJECTIVE: Kathy Frank is a 41 y.o. female accompanied by self   Patient was referred by Dr. Johnnye Sima for depressive symptoms.  Patient reports the following symptoms/concerns: Mood is "angry" towards her situation and another person; feelings of worthlessness; denied engaging in pleasurable activities; "staying up all night"- sleep hard to initiate and fall asleep close to wake up time; decreased appetite where eating one meal a day; some social interaction; some feelings of worthlessness; decreased concentration; and denied general thoughts of death.  Patient denied current suicidal ideations, plan, or intent but also stated that she would not inform anyone if experienced.  Patient reported past suicide attempt in high school by OD that required medical treatment.  Patient reported lack of support in managing her HIV status because she has not informed anyone of her diagnosis.  Patient reported that she doesn't feel like she can trust anyone with her diagnosis status.  [facility] offered appointment for management of depressive symptoms but patient reported that she needed to think about it.  [facility] provided business card with number.  [facility] provided list of community mental health agencies with contact information and walk-in assessment times.  [facility] provided [facility] contact information and requested that patient admit self there, report to the nearest emergency department, or call 911 if suicidal ideations begin.  [facility] provided mobile crisis and suicide hotline numbers.  [facility] asked patient is she would be willing to discuss depressive and sleep symptoms with Dr. Johnnye Sima for  possible medications and patient was receptive.  [facility] provided patient with [place] schedule and introduced support groups and lunch schedule.  [facility] encouraged attendance at support groups for additional support.  Duration of problem: "1 year"; Severity of problem: moderate  OBJECTIVE: Mood: Depressed and Affect: Tearful Risk of harm to self or others: No plan to harm self or others  ASSESSMENT: Patient is currently experiencing depressive symptoms and lack of support and may benefit from behavioral health services, medication management, and support groups.  GOALS ADDRESSED: Patient will: 1.  Reduce symptoms of: depression  2.  Increase knowledge and/or ability of: coping skills and social support  3.  Demonstrate ability to: Increase healthy adjustment to current life circumstances and Increase adequate support systems for patient/family  INTERVENTIONS: Interventions utilized:  Supportive Counseling and Link to [employer]  PLAN: 1. Behavioral recommendations: [facility] provided patient with business card and patient will call back for additional assistance as needed.  Patient will use emergency services as needed.  Patient will discuss medication options with provider.  Patient will attend support groups at [facility] for additional support. 2. Referral(s): [employer] forces logistics/support/administrative officer (LME/Outside Clinic)   Sande Rives, [facility]

## 2017-05-17 NOTE — Assessment & Plan Note (Signed)
She is eval by our mental health counselor.  Will start her on zoloft to see if this would help She does not want trazadone refill, gave her n/v, stomach pain.  Will see her back in 6 weeks

## 2017-05-17 NOTE — Assessment & Plan Note (Signed)
Elevated today States she has been taking her BP rx Will f/u PCP.

## 2017-05-17 NOTE — Assessment & Plan Note (Signed)
Has quit, encouraged!

## 2017-07-06 ENCOUNTER — Telehealth: Payer: Self-pay | Admitting: *Deleted

## 2017-07-06 NOTE — Telephone Encounter (Signed)
Patient called stating that she is having panic attacks and the "depression" medication that Dr. Johnnye Sima prescribed is not working. Advised patient that we can schedule her with one of our counselors and she declined. Asked if she is still followed at Internal Medicine and she said yes. She agreed to follow up with that office.

## 2017-07-09 ENCOUNTER — Other Ambulatory Visit: Payer: Self-pay | Admitting: *Deleted

## 2017-07-09 MED ORDER — GLUCOSE BLOOD VI STRP: ORAL_STRIP | 11 refills | Status: DC

## 2017-07-13 ENCOUNTER — Other Ambulatory Visit: Payer: Self-pay | Admitting: Infectious Diseases

## 2017-07-13 DIAGNOSIS — B20 Human immunodeficiency virus [HIV] disease: Secondary | ICD-10-CM

## 2017-07-14 ENCOUNTER — Other Ambulatory Visit: Payer: Self-pay | Admitting: *Deleted

## 2017-07-14 DIAGNOSIS — I10 Essential (primary) hypertension: Secondary | ICD-10-CM

## 2017-07-14 MED ORDER — AMLODIPINE BESYLATE 10 MG PO TABS: 10.0000 mg | ORAL_TABLET | Freq: Every day | ORAL | 0 refills | Status: DC

## 2017-07-14 NOTE — Telephone Encounter (Signed)
Per chart review it appears that the patient's blood pressure was elevated at her appointment with Dr. Johnnye Sima. I refilled amlodipine 10mg  tablet for 30 days, but I think it will be beneficial for the patient to be seen to adjust dosing.   -Needs appt scheduled for blood pressure control -Please ask patient to measure her bp at home and bring record to clinic

## 2017-07-23 NOTE — Telephone Encounter (Signed)
Lm for pt with instructions

## 2017-08-06 ENCOUNTER — Other Ambulatory Visit: Payer: Self-pay

## 2017-08-06 ENCOUNTER — Ambulatory Visit (INDEPENDENT_AMBULATORY_CARE_PROVIDER_SITE_OTHER): Payer: Medicaid Other | Admitting: Internal Medicine

## 2017-08-06 VITALS — BP 160/94 | HR 81 | Temp 97.9°F | Wt 173.3 lb

## 2017-08-06 DIAGNOSIS — R112 Nausea with vomiting, unspecified: Secondary | ICD-10-CM | POA: Diagnosis not present

## 2017-08-06 DIAGNOSIS — K3184 Gastroparesis: Secondary | ICD-10-CM | POA: Diagnosis not present

## 2017-08-06 DIAGNOSIS — F339 Major depressive disorder, recurrent, unspecified: Secondary | ICD-10-CM | POA: Diagnosis not present

## 2017-08-06 DIAGNOSIS — F329 Major depressive disorder, single episode, unspecified: Secondary | ICD-10-CM

## 2017-08-06 DIAGNOSIS — Z79899 Other long term (current) drug therapy: Secondary | ICD-10-CM

## 2017-08-06 DIAGNOSIS — E1143 Type 2 diabetes mellitus with diabetic autonomic (poly)neuropathy: Secondary | ICD-10-CM

## 2017-08-06 DIAGNOSIS — F32A Depression, unspecified: Secondary | ICD-10-CM

## 2017-08-06 MED ORDER — METOCLOPRAMIDE HCL 5 MG PO TABS: 5.0000 mg | ORAL_TABLET | Freq: Three times a day (TID) | ORAL | 0 refills | Status: DC

## 2017-08-06 MED ORDER — ONDANSETRON 4 MG PO TBDP: 4.0000 mg | ORAL_TABLET | Freq: Three times a day (TID) | ORAL | 0 refills | Status: DC | PRN

## 2017-08-06 MED ORDER — CITALOPRAM HYDROBROMIDE 20 MG PO TABS: 20.0000 mg | ORAL_TABLET | Freq: Every day | ORAL | 2 refills | Status: DC

## 2017-08-06 NOTE — Progress Notes (Signed)
   CC: depression  HPI:  [name] is a 41 y.o. woman with a past medical history listed below here today for follow up of her depression.  For details of today's visit and the status of her chronic medical issues please refer to the assessment and plan.   Past Medical History:  Diagnosis Date  . CKD (chronic kidney disease), stage III ([place])   . Cyclical vomiting    THC induced???  . Depression   . DKA (diabetic ketoacidoses) ([place]) 12/2014  . Erosive esophagitis   . Gastroparesis    ? diabetic  . GERD (gastroesophageal reflux disease)   . Human immunodeficiency virus (HIV) disease ([place]) dx'd 04/2016  . Hypertension   . Normocytic anemia   . Type II diabetes mellitus ([place]) 1994   diagnosed around 1994   Review of Systems:  Please see pertinent ROS reviewed in HPI and problem based charting.   Physical Exam:  Vitals:   08/06/17 1531  BP: (!) 160/94  Pulse: 81  Temp: 97.9 F (36.6 C)  TempSrc: Oral  SpO2: 100%  Weight: 173 lb 4.8 oz (78.6 kg)   Physical Exam  Constitutional: She is oriented to person, place, and time and well-developed, well-nourished, and in no distress.  HENT:  Head: Normocephalic and atraumatic.  Pulmonary/Chest: Effort normal.  Abdominal: Soft. Bowel sounds are normal. She exhibits no distension. There is no tenderness. There is no rebound.  Neurological: She is alert and oriented to person, place, and time.  Psychiatric:  Withdrawn, flattened affect     Assessment & Plan:   See Encounters Tab for problem based charting.  Patient discussed with Dr. Rebeca Alert.  Depression Assessment: She reports stopping her Zoloft 2 weeks ago because it made her stomach hurt and she experienced nausea and vomiting.  She has not taken Trazodone for several months.  Since stopping Zoloft, she reports worsening depression and anxiety.  She does not think the Zoloft was helpful despite worsening symptoms since stopping it.  She is interested in different  SSRI as well as therapy.  Her PHQ today is 25.  She denies SI/HI.  Plan: - Referral to psychiatry placed today - Start Celexa 20mg  daily - Discontinue Zoloft and Trazodone - Follow up in 4-6 weeks with PCP  Nausea & vomiting Assessment: She states that she needs a refill of her Reglan and Zofran for chronic nausea and vomiting that has been attributed to diabetic gastroparesis.  Plan: - Refills provided - Further management per PCP

## 2017-08-06 NOTE — Assessment & Plan Note (Signed)
Assessment: She states that she needs a refill of her Reglan and Zofran for chronic nausea and vomiting that has been attributed to diabetic gastroparesis.  Plan: - Refills provided - Further management per PCP

## 2017-08-06 NOTE — Assessment & Plan Note (Signed)
Assessment: She reports stopping her Zoloft 2 weeks ago because it made her stomach hurt and she experienced nausea and vomiting.  She has not taken Trazodone for several months.  Since stopping Zoloft, she reports worsening depression and anxiety.  She does not think the Zoloft was helpful despite worsening symptoms since stopping it.  She is interested in different SSRI as well as therapy.  Her PHQ today is 25.  She denies SI/HI.  Plan: - Referral to psychiatry placed today - Start Celexa 20mg  daily - Discontinue Zoloft and Trazodone - Follow up in 4-6 weeks with PCP

## 2017-08-06 NOTE — Patient Instructions (Signed)
FOLLOW-UP INSTRUCTIONS When: 4 weeks For:  depression What to bring: current medication  For Depression: - STOP taking Zoloft and Trazodone - START taking Celexa 20mg  daily for depression - I have referred you to counseling as well  For Abdominal Pain: - I have refilled your reglan and zofran

## 2017-08-10 DIAGNOSIS — Z9289 Personal history of other medical treatment: Secondary | ICD-10-CM

## 2017-08-10 HISTORY — DX: Personal history of other medical treatment: Z92.89

## 2017-08-12 ENCOUNTER — Other Ambulatory Visit: Payer: Self-pay | Admitting: Internal Medicine

## 2017-08-12 DIAGNOSIS — I10 Essential (primary) hypertension: Secondary | ICD-10-CM

## 2017-08-12 NOTE — Progress Notes (Signed)
Internal Medicine Clinic Attending  Case discussed with Dr. Wallace  at the time of the visit.  We reviewed the resident's history and exam and pertinent patient test results.  I agree with the assessment, diagnosis, and plan of care documented in the resident's note.  Rhoderick Farrel N Raela Bohl, MD   

## 2017-08-20 ENCOUNTER — Telehealth: Payer: Self-pay

## 2017-08-24 ENCOUNTER — Telehealth: Payer: Self-pay | Admitting: Licensed Clinical Social Worker

## 2017-08-24 NOTE — Telephone Encounter (Signed)
[facility] received phone message from [facility] regarding scheduling next patient appointment.  She requested a return phone call.  Sande Rives, [facility]

## 2017-08-24 NOTE — Telephone Encounter (Signed)
[facility] called patient to discuss scheduling next appointment with a community health provider instead of the [facility] due to severity of symptoms and limit of 6 sessions within a year.  [facility] left a message requesting a return phone call.  Sande Rives, [facility]

## 2017-08-24 NOTE — Telephone Encounter (Signed)
[facility] called Sholon and spoke with her about the need for long-term community mental health provider due to severity of symptoms and the 6 visit limit for [facility] sessions.  [facility] informed that she would call patient and encourage her to schedule appointment with [facility] outpatient behavioral health.  Sholon suggested offering provider [facility] to patient.  Sande Rives, [facility]

## 2017-09-03 ENCOUNTER — Telehealth: Payer: Self-pay | Admitting: Licensed Clinical Social Worker

## 2017-09-03 NOTE — Telephone Encounter (Signed)
[facility] called patient at both numbers and left a message at both requesting a return phone call.  Sande Rives, [facility]

## 2017-09-06 ENCOUNTER — Telehealth: Payer: Self-pay | Admitting: Licensed Clinical Social Worker

## 2017-09-06 NOTE — Telephone Encounter (Signed)
[facility] called patient in attempt to schedule appointment and left message with available dates/times and requested a return call.  Sande Rives, [facility]

## 2017-09-06 NOTE — Telephone Encounter (Signed)
Patient called and left a message in attempt to schedule an appointment.  Sande Rives, [facility]

## 2017-09-12 ENCOUNTER — Encounter (HOSPITAL_COMMUNITY): Payer: Self-pay

## 2017-09-12 ENCOUNTER — Inpatient Hospital Stay (HOSPITAL_COMMUNITY)
Admission: EM | Admit: 2017-09-12 | Discharge: 2017-09-14 | DRG: 202 | Disposition: A | Discharge status: Home / Self Care | Payer: Medicaid Other | Attending: Oncology | Admitting: Oncology

## 2017-09-12 ENCOUNTER — Emergency Department (HOSPITAL_COMMUNITY): Payer: Medicaid Other

## 2017-09-12 DIAGNOSIS — Z794 Long term (current) use of insulin: Secondary | ICD-10-CM | POA: Diagnosis not present

## 2017-09-12 DIAGNOSIS — B20 Human immunodeficiency virus [HIV] disease: Secondary | ICD-10-CM | POA: Diagnosis not present

## 2017-09-12 DIAGNOSIS — D72819 Decreased white blood cell count, unspecified: Secondary | ICD-10-CM

## 2017-09-12 DIAGNOSIS — Z833 Family history of diabetes mellitus: Secondary | ICD-10-CM

## 2017-09-12 DIAGNOSIS — E1122 Type 2 diabetes mellitus with diabetic chronic kidney disease: Secondary | ICD-10-CM | POA: Diagnosis not present

## 2017-09-12 DIAGNOSIS — D6489 Other specified anemias: Secondary | ICD-10-CM

## 2017-09-12 DIAGNOSIS — F339 Major depressive disorder, recurrent, unspecified: Secondary | ICD-10-CM

## 2017-09-12 DIAGNOSIS — R112 Nausea with vomiting, unspecified: Secondary | ICD-10-CM

## 2017-09-12 DIAGNOSIS — Z6829 Body mass index (BMI) 29.0-29.9, adult: Secondary | ICD-10-CM

## 2017-09-12 DIAGNOSIS — Z87891 Personal history of nicotine dependence: Secondary | ICD-10-CM

## 2017-09-12 DIAGNOSIS — R197 Diarrhea, unspecified: Secondary | ICD-10-CM

## 2017-09-12 DIAGNOSIS — E669 Obesity, unspecified: Secondary | ICD-10-CM | POA: Diagnosis present

## 2017-09-12 DIAGNOSIS — J40 Bronchitis, not specified as acute or chronic: Principal | ICD-10-CM | POA: Diagnosis present

## 2017-09-12 DIAGNOSIS — I129 Hypertensive chronic kidney disease with stage 1 through stage 4 chronic kidney disease, or unspecified chronic kidney disease: Secondary | ICD-10-CM

## 2017-09-12 DIAGNOSIS — E113393 Type 2 diabetes mellitus with moderate nonproliferative diabetic retinopathy without macular edema, bilateral: Secondary | ICD-10-CM | POA: Diagnosis present

## 2017-09-12 DIAGNOSIS — E119 Type 2 diabetes mellitus without complications: Secondary | ICD-10-CM | POA: Diagnosis present

## 2017-09-12 DIAGNOSIS — B974 Respiratory syncytial virus as the cause of diseases classified elsewhere: Secondary | ICD-10-CM | POA: Diagnosis not present

## 2017-09-12 DIAGNOSIS — R059 Cough, unspecified: Secondary | ICD-10-CM

## 2017-09-12 DIAGNOSIS — R6889 Other general symptoms and signs: Secondary | ICD-10-CM

## 2017-09-12 DIAGNOSIS — D649 Anemia, unspecified: Secondary | ICD-10-CM | POA: Diagnosis present

## 2017-09-12 DIAGNOSIS — I10 Essential (primary) hypertension: Secondary | ICD-10-CM | POA: Diagnosis present

## 2017-09-12 DIAGNOSIS — F32A Depression, unspecified: Secondary | ICD-10-CM | POA: Diagnosis present

## 2017-09-12 DIAGNOSIS — G43A0 Cyclical vomiting, not intractable: Secondary | ICD-10-CM

## 2017-09-12 DIAGNOSIS — J069 Acute upper respiratory infection, unspecified: Secondary | ICD-10-CM | POA: Diagnosis not present

## 2017-09-12 DIAGNOSIS — F329 Major depressive disorder, single episode, unspecified: Secondary | ICD-10-CM | POA: Diagnosis present

## 2017-09-12 DIAGNOSIS — E1169 Type 2 diabetes mellitus with other specified complication: Secondary | ICD-10-CM

## 2017-09-12 DIAGNOSIS — N183 Chronic kidney disease, stage 3 (moderate): Secondary | ICD-10-CM | POA: Diagnosis present

## 2017-09-12 DIAGNOSIS — R05 Cough: Secondary | ICD-10-CM

## 2017-09-12 DIAGNOSIS — E1165 Type 2 diabetes mellitus with hyperglycemia: Secondary | ICD-10-CM | POA: Diagnosis present

## 2017-09-12 DIAGNOSIS — N189 Chronic kidney disease, unspecified: Secondary | ICD-10-CM | POA: Diagnosis present

## 2017-09-12 DIAGNOSIS — K219 Gastro-esophageal reflux disease without esophagitis: Secondary | ICD-10-CM | POA: Diagnosis present

## 2017-09-12 DIAGNOSIS — E860 Dehydration: Secondary | ICD-10-CM | POA: Diagnosis present

## 2017-09-12 DIAGNOSIS — Z79899 Other long term (current) drug therapy: Secondary | ICD-10-CM

## 2017-09-12 DIAGNOSIS — E876 Hypokalemia: Secondary | ICD-10-CM | POA: Diagnosis present

## 2017-09-12 DIAGNOSIS — N179 Acute kidney failure, unspecified: Secondary | ICD-10-CM | POA: Diagnosis present

## 2017-09-12 DIAGNOSIS — Z7984 Long term (current) use of oral hypoglycemic drugs: Secondary | ICD-10-CM

## 2017-09-12 HISTORY — DX: Obesity, unspecified: E66.9

## 2017-09-12 HISTORY — DX: Gastro-esophageal reflux disease without esophagitis: K21.9

## 2017-09-12 HISTORY — DX: Human immunodeficiency virus (HIV) disease: B20

## 2017-09-12 HISTORY — DX: Type 2 diabetes mellitus with other specified complication: E11.69

## 2017-09-12 HISTORY — DX: Nicotine dependence, unspecified, uncomplicated: F17.200

## 2017-09-12 HISTORY — DX: Unspecified contact dermatitis, unspecified cause: L25.9

## 2017-09-12 HISTORY — DX: Essential (primary) hypertension: I10

## 2017-09-12 HISTORY — DX: Type 2 diabetes mellitus with moderate nonproliferative diabetic retinopathy without macular edema, bilateral: E11.3393

## 2017-09-12 LAB — URINALYSIS, ROUTINE W REFLEX MICROSCOPIC
Bilirubin Urine: NEGATIVE
Glucose, UA: 150 mg/dL — AB
Hgb urine dipstick: NEGATIVE
Ketones, ur: NEGATIVE mg/dL
Leukocytes, UA: NEGATIVE
Nitrite: NEGATIVE
Protein, ur: >=300 mg/dL — AB
Specific Gravity, Urine: 1.027 (ref 1.005–1.030)
pH: 6 (ref 5.0–8.0)

## 2017-09-12 LAB — CBC
HCT: 31.5 % — ABNORMAL LOW (ref 36.0–46.0)
Hemoglobin: 10.7 g/dL — ABNORMAL LOW (ref 12.0–15.0)
MCH: 31.6 pg (ref 26.0–34.0)
MCHC: 34 g/dL (ref 30.0–36.0)
MCV: 92.9 fL (ref 78.0–100.0)
Platelets: 214 10*3/uL (ref 150–400)
RBC: 3.39 MIL/uL — ABNORMAL LOW (ref 3.87–5.11)
RDW: 13.6 % (ref 11.5–15.5)
WBC: 3.5 10*3/uL — ABNORMAL LOW (ref 4.0–10.5)

## 2017-09-12 LAB — COMPREHENSIVE METABOLIC PANEL
ALT: 10 U/L — ABNORMAL LOW (ref 14–54)
AST: 20 U/L (ref 15–41)
Albumin: 2.5 g/dL — ABNORMAL LOW (ref 3.5–5.0)
Alkaline Phosphatase: 77 U/L (ref 38–126)
Anion gap: 14 (ref 5–15)
BUN: 25 mg/dL — ABNORMAL HIGH (ref 6–20)
CO2: 21 mmol/L — ABNORMAL LOW (ref 22–32)
Calcium: 8.4 mg/dL — ABNORMAL LOW (ref 8.9–10.3)
Chloride: 96 mmol/L — ABNORMAL LOW (ref 101–111)
Creatinine, Ser: 3.87 mg/dL — ABNORMAL HIGH (ref 0.44–1.00)
GFR calc Af Amer: 16 mL/min — ABNORMAL LOW (ref >60)
GFR calc non Af Amer: 14 mL/min — ABNORMAL LOW (ref >60)
Glucose, Bld: 285 mg/dL — ABNORMAL HIGH (ref 65–99)
Potassium: 3.8 mmol/L (ref 3.5–5.1)
Sodium: 131 mmol/L — ABNORMAL LOW (ref 135–145)
Total Bilirubin: 0.5 mg/dL (ref 0.3–1.2)
Total Protein: 7.3 g/dL (ref 6.5–8.1)

## 2017-09-12 LAB — INFLUENZA PANEL BY PCR (TYPE A & B)
Influenza A By PCR: NEGATIVE
Influenza B By PCR: NEGATIVE

## 2017-09-12 LAB — I-STAT BETA HCG BLOOD, ED (MC, WL, AP ONLY): I-stat hCG, quantitative: <5 m[IU]/mL (ref <5)

## 2017-09-12 LAB — LIPASE, BLOOD: Lipase: 31 U/L (ref 11–51)

## 2017-09-12 LAB — I-STAT CG4 LACTIC ACID, ED: Lactic Acid, Venous: 1.15 mmol/L (ref 0.5–1.9)

## 2017-09-12 MED ORDER — ACETAMINOPHEN 500 MG PO TABS
1000.0000 mg | ORAL_TABLET | Freq: Three times a day (TID) | ORAL | Status: DC | PRN
  Administered 2017-09-13: 1000 mg via ORAL
  Filled 2017-09-12: qty 2

## 2017-09-12 MED ORDER — ONDANSETRON 4 MG PO TBDP
4.0000 mg | ORAL_TABLET | Freq: Four times a day (QID) | ORAL | Status: DC | PRN
  Administered 2017-09-13 – 2017-09-14 (×2): 4 mg via ORAL
  Filled 2017-09-12 (×3): qty 1

## 2017-09-12 MED ORDER — LEVALBUTEROL HCL 1.25 MG/0.5ML IN NEBU
1.2500 mg | INHALATION_SOLUTION | Freq: Once | RESPIRATORY_TRACT | Status: DC
  Filled 2017-09-12: qty 0.5

## 2017-09-12 MED ORDER — POTASSIUM CHLORIDE IN NACL 20-0.9 MEQ/L-% IV SOLN
INTRAVENOUS | Status: DC
  Administered 2017-09-13 (×2): via INTRAVENOUS
  Filled 2017-09-12 (×3): qty 1000

## 2017-09-12 MED ORDER — HEPARIN SODIUM (PORCINE) 5000 UNIT/ML IJ SOLN
5000.0000 [IU] | Freq: Three times a day (TID) | INTRAMUSCULAR | Status: DC
  Administered 2017-09-13 – 2017-09-14 (×3): 5000 [IU] via SUBCUTANEOUS
  Filled 2017-09-12 (×3): qty 1

## 2017-09-12 MED ORDER — DOXYCYCLINE HYCLATE 100 MG PO TABS
100.0000 mg | ORAL_TABLET | Freq: Once | ORAL | Status: AC
  Administered 2017-09-12: 100 mg via ORAL
  Filled 2017-09-12: qty 1

## 2017-09-12 MED ORDER — INSULIN ASPART 100 UNIT/ML ~~LOC~~ SOLN
0.0000 [IU] | Freq: Three times a day (TID) | SUBCUTANEOUS | Status: DC
  Administered 2017-09-13 (×2): 1 [IU] via SUBCUTANEOUS
  Administered 2017-09-13: 2 [IU] via SUBCUTANEOUS

## 2017-09-12 MED ORDER — ACETAMINOPHEN 325 MG PO TABS: 650.0000 mg | ORAL_TABLET | Freq: Four times a day (QID) | ORAL | Status: DC | PRN

## 2017-09-12 MED ORDER — ALBUTEROL SULFATE (2.5 MG/3ML) 0.083% IN NEBU
5.0000 mg | INHALATION_SOLUTION | Freq: Once | RESPIRATORY_TRACT | Status: AC
  Administered 2017-09-12: 5 mg via RESPIRATORY_TRACT
  Filled 2017-09-12: qty 6

## 2017-09-12 MED ORDER — INSULIN GLARGINE 100 UNIT/ML ~~LOC~~ SOLN
10.0000 [IU] | Freq: Every day | SUBCUTANEOUS | Status: DC
  Administered 2017-09-13 (×2): 10 [IU] via SUBCUTANEOUS
  Filled 2017-09-12 (×3): qty 0.1

## 2017-09-12 MED ORDER — ACETAMINOPHEN 650 MG RE SUPP: 650.0000 mg | Freq: Four times a day (QID) | RECTAL | Status: DC | PRN

## 2017-09-12 MED ORDER — EMTRICITABINE-TENOFOVIR AF 200-25 MG PO TABS
1.0000 | ORAL_TABLET | Freq: Every day | ORAL | Status: DC
  Administered 2017-09-13 – 2017-09-14 (×2): 1 via ORAL
  Filled 2017-09-12 (×2): qty 1

## 2017-09-12 MED ORDER — SODIUM CHLORIDE 0.9 % IV BOLUS (SEPSIS)
2000.0000 mL | Freq: Once | INTRAVENOUS | Status: AC
  Administered 2017-09-12: 2000 mL via INTRAVENOUS

## 2017-09-12 MED ORDER — IPRATROPIUM BROMIDE 0.02 % IN SOLN
0.5000 mg | Freq: Once | RESPIRATORY_TRACT | Status: AC
  Administered 2017-09-12: 0.5 mg via RESPIRATORY_TRACT
  Filled 2017-09-12: qty 2.5

## 2017-09-12 MED ORDER — DOLUTEGRAVIR SODIUM 50 MG PO TABS
50.0000 mg | ORAL_TABLET | Freq: Every day | ORAL | Status: DC
  Administered 2017-09-13 – 2017-09-14 (×2): 50 mg via ORAL
  Filled 2017-09-12 (×2): qty 1

## 2017-09-12 MED ORDER — ONDANSETRON HCL 4 MG/2ML IJ SOLN
4.0000 mg | Freq: Four times a day (QID) | INTRAMUSCULAR | Status: DC | PRN
  Administered 2017-09-14: 4 mg via INTRAVENOUS
  Filled 2017-09-12 (×2): qty 2

## 2017-09-12 MED ORDER — ALBUTEROL SULFATE (2.5 MG/3ML) 0.083% IN NEBU: 2.5000 mg | INHALATION_SOLUTION | Freq: Four times a day (QID) | RESPIRATORY_TRACT | Status: DC | PRN

## 2017-09-12 MED ORDER — ACETAMINOPHEN 325 MG PO TABS
650.0000 mg | ORAL_TABLET | Freq: Once | ORAL | Status: AC
Start: 2017-09-12 — End: 2017-09-12
  Administered 2017-09-12: 650 mg via ORAL
  Filled 2017-09-12: qty 2

## 2017-09-12 MED ORDER — ONDANSETRON HCL 4 MG/2ML IJ SOLN
4.0000 mg | Freq: Once | INTRAMUSCULAR | Status: AC
  Administered 2017-09-12: 4 mg via INTRAVENOUS
  Filled 2017-09-12: qty 2

## 2017-09-12 MED ORDER — IPRATROPIUM-ALBUTEROL 0.5-2.5 (3) MG/3ML IN SOLN
3.0000 mL | Freq: Four times a day (QID) | RESPIRATORY_TRACT | Status: DC
  Administered 2017-09-13 (×2): 3 mL via RESPIRATORY_TRACT
  Filled 2017-09-12 (×2): qty 3

## 2017-09-12 MED ORDER — CITALOPRAM HYDROBROMIDE 20 MG PO TABS
20.0000 mg | ORAL_TABLET | Freq: Every day | ORAL | Status: DC
  Administered 2017-09-13 – 2017-09-14 (×2): 20 mg via ORAL
  Filled 2017-09-12 (×2): qty 1

## 2017-09-12 NOTE — ED Notes (Signed)
Attempted PIV x ; no success. Second RN to attempt.

## 2017-09-12 NOTE — H&P (Signed)
Date: 09/13/2017               Patient Name:  Kathy Frank MRN: [id number]  DOB: [birth year] Age / Sex: 42 y.o., female   PCP: Lars Mage, MD         Medical Service: Internal Medicine Teaching Service         Attending Physician: Dr. Annia Belt, MD    First Contact: Dr. Johny Chess Pager: [phone]  Second Contact: Dr. Philipp Ovens Pager: [phone]       After Hours (After 5p/  First Contact Pager: [phone]  weekends / holidays): Second Contact Pager: [phone]   Chief Complaint: shortness of breath  History of Present Illness: Kathy Frank is a 61 yoF with known history of HIV who presented with four days of cough, chills, generalized body aches, dyspnea on exertion, purulent nasal drainage, sinus congestion, fever and pulmonary congestion/wheezing. She has a PMHx significant for HTN, insulin dependent diabetes, depression, CKD III, cyclic vomiting syndrome, probable gastroparesis and anemia. She stated that approximately four days prior on 09/09/2017 she developed a fever, myalgias and chest congestion which progressed through the following four days. This was accompanied by diarrhea by 09/11/2017 and vomiting following each attempt as self treatment of her cough with robitussin. She attempted relief with Alka-Seltzer with minimal improvement in her body aches. Her symptoms are complicated by 3-8/75 pleuritic chest pain. She is compliant with her HIV medications but has held the Lantus as she has not had PO intake since onset of her symptoms. She attested to her daughter having the same symptoms with regard to the body aches, fever, chills, and diarrhea just 5 days prior. She believes that her daughter was diagnosed with a URI upon her visit to the ED.  She denied night sweats, orthopnea, hemoptysis, weight loss, chest pain other than mentioned above, abdominal pain with the diarrhea or without, hematemesis, melena, constipation, focal weakness, visual changes, headache, or ear  pain/drainage.  In the ED she was treated with fluids for an AKI, albuterol/duonebs for her dyspnea, Zofran for the nausea, and doxycycline due to concern for bacterial bronchitis. Lipase was 31, CMP remarkable for decreased Na to ~134 with correction, Cr 3.87 elevated above baseline of ~ 2.6(past 9 months) and Cl of 96. CBC indicated a mild leukopenia at 3.5 and stable anemia at 10.7. UA was inconsistent with UTI but continued to demonstrate proteinuria. Influenza was negative as was the lactate. IMTS was called for the admission due to concern for an acute pulmonary process and AKI.  Meds:  No outpatient medications have been marked as taking for the 09/12/17 encounter [facility] Encounter).  Amlodipine 10mg  daily Citalopram 20mg  daily Descovy 200-25mg  daily Lantus 15 units QHS Metformin 1000mg  daily with breakfast Metoclopramide 5mg  TID before meals Zofran 4 mg Q8 hours for nausea/vomiting Pantoprazole 40mg  daily Sodium Bicarbonate 650mg  BID TIVACAY 50 mg daily  Allergies: Allergies as of 09/12/2017  . (No Known Allergies)   Past Medical History:  Diagnosis Date  . CKD (chronic kidney disease) stage 3, GFR 30-59 ml/min (HCC) 06/19/2016   06/30/16: Seen by Dr. Posey Pronto [Nephrology]. Suspect etiologies to include diabetes and hypertension though cannot exclude HIV-associated nephropathy. Advised her against from using excessive Goody powder.  . CKD (chronic kidney disease), stage III ([place])   . Contact dermatitis 04/21/2016   07/29/16: Skin biopsy performed by [facility] notable for acute spongiotic dermatitis consistent with contact dermatitis.  . Cyclical vomiting    THC induced???  .  Depression   . Depression 06/28/2006   Qualifier: Diagnosis of  By: Riccardo Dubin MD, Sherren Mocha    . Diabetes mellitus type 2 in obese ([place]) 06/28/1994  . DKA (diabetic ketoacidoses) ([place]) 12/2014  . Erosive esophagitis   . Esophageal reflux   . Essential hypertension 11/16/2013  . Gastroparesis    ?  diabetic  . GERD (gastroesophageal reflux disease)   . Human immunodeficiency virus (HIV) disease ([place]) dx'd 04/2016  . Human immunodeficiency virus (HIV) disease ([place]) 04/23/2016  . Hypertension   . Moderate nonproliferative diabetic retinopathy of both eyes ([place]) 11/21/2014   11/14/14: Noted on retinal imaging; needs follow-up imaging in 6 months  05/22/16: Noted on retinal imaging again; needs follow-up imaging in 6 months  . Normocytic anemia   . TOBACCO USER 06/28/2006   Qualifier: Diagnosis of  By: Riccardo Dubin MD, Sherren Mocha    . Type II diabetes mellitus ([place]) 1994   diagnosed around 59   Family History:  DMII mother DMII daughters DMII Brother  Social History:  Denied tobacco EtOH socially 1-2 drinks every 2-3 weeks Marijuana 2-3 times weekly   Review of Systems: A complete ROS was negative except as per HPI.   Physical Exam: Blood pressure 129/73, pulse (!) 111, temperature 98.5 F (36.9 C), temperature source Oral, resp. rate 16, last menstrual period 08/27/2017, SpO2 95 %. Physical Exam  Constitutional: She is oriented to person, place, and time. She appears well-developed and well-nourished. No distress.  HENT:  Head: Normocephalic and atraumatic.  Eyes: Conjunctivae and EOM are normal.  Neck: Normal range of motion. Neck supple.  Cardiovascular: Normal rate and regular rhythm.  No murmur heard. Pulmonary/Chest: Effort normal. No accessory muscle usage or stridor. No tachypnea and no bradypnea. No respiratory distress. She has wheezes (Faint inspiratory ) in the right upper field, the right middle field, the right lower field, the left upper field, the left middle field and the left lower field. She has rales (Mild) in the right lower field and the left lower field.  Abdominal: Soft. Bowel sounds are normal. She exhibits no distension. There is no tenderness.  Musculoskeletal: She exhibits no edema or tenderness.  Neurological: She is alert and oriented to person, place, and  time.  Skin: Skin is warm. Capillary refill takes less than 2 seconds. She is not diaphoretic.  Psychiatric: She has a normal mood and affect.  Nursing note and vitals reviewed.  EKG: personally reviewed my interpretation is n/a  CXR: personally reviewed my interpretation is no focal infiltration or opacity, mild increased opacity of the bronchial tree.   Assessment & Plan by Problem: Principal Problem:   Acute-on-chronic kidney injury ([place]) Active Problems:   Diabetes mellitus type 2 in obese [facility])   Depression   Essential hypertension   Human immunodeficiency virus (HIV) disease ([place])   Flu-like symptoms  Assessment: Kathy Frank is a pleasant 48 yoF who presented to the ED for flu like symptoms and decreased PO intake x 4 days. Given her physical exam on presentation, history of sick contacts, labs thus far and imaging, she is most likely experiencing a viral bronchitis. However, bacterial super infection or rather a primary bacterial infection in a patient with an HIV history and no recent CD4 is of concern. She will be admitted for treatment of her acute on chronic renal injury and viral bronchitis. Evaluation of her HIV status will be performed to better gauge her responsiveness to infection (ie. Immunocompromised status).   Flu-like symptoms: Influenza swab negative. Symptoms consistent  with influenza. Patient received vaccine in 05/2017.  -Respiratory panel ordered -albuterol nebs q6 hours PRN -Duonebs Q6 hours PRN -Tylenol 1000mg  for fever or pain Q 6 hours -Single dose of doxycycline in the ED  Insulin dependent type II diabetes: History of well controlled insulin dependent diabetes. -Lantus 10 units daily. -SSI sensitive TID w/ meals  HIV: Patient stated that she was compliant with her medications at home having not missed a dose in several months. Recent CD4 in 01/2017 was 390,  -Continue TIVICAY, dolutegravir -Continue DESCOVY, emtricitabine-tenofovir  -Repeat  CD-4 -CBC with diff in am and save smear -Ordered diff for recent CBC -HIV 1 RNA quant   Acute on chronic kidney injury: (CKD III) Increase in Cr from ~2.6 most recent to 3.87, however, with no recent recorded values to denote the rate of decline. Most likely the combination of a prerenal injury from decreased PO intake and her underlying CKDIII. 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 As well as DMII? -BMP in am -Rehydration at 177ml/hr of normal saline -monitor I/O's daily  HTN: History of HTN. Continue amlodipine 10mg  daily  Anemia: Most likely secondary to renal disease. -Hgb stable on admission at 10.7g/dL. -CBC in am  Depression: -Continue home Celexa 20mg  daily  Diet: regular as tolerated Code: Full Fluids: 155ml/hr following 2 liters bolus normal saline Dvt ppx: heparin  Dispo: Admit patient to Inpatient with expected length of stay greater than 2 midnights.  Signed: [name], MD 09/13/2017, 12:11 AM  Pager: Pager# [phone]

## 2017-09-12 NOTE — ED Triage Notes (Signed)
Patient complains of 4 days of cough and congestion with diarrhea. States that her abdomen has started cramping yesterday. Alert and oriented, NAD

## 2017-09-12 NOTE — ED Provider Notes (Signed)
[facility] EMERGENCY DEPARTMENT Provider Note   CSN: [id number] Arrival date & time: 09/12/17  1406     History   Chief Complaint Chief Complaint  Patient presents with  . cough/vomiting    HPI Kathy Frank is a 42 y.o. female with a PMHx of CKD3, DM2, cyclic vomiting syndrome, gastroparesis, HIV (last CD4 count 390 on 02/02/17), HTN, anemia, and other conditions listed below, who presents to the ED with complaints of flulike symptoms x 2 days.  Symptoms include generalized body aches, nonproductive cough, mild wheezing, DOE, sinus congestion, rhinorrhea, sore throat, chills, nausea, one episode daily of nonbloody nonbilious emesis, and 3 episodes daily of nonbloody diarrhea.  She has tried Alka-Seltzer and Robitussin with minimal relief in symptoms.  No known aggravating factors.  She reports positive sick contacts at home stating that her daughter is also sick with similar symptoms.  She received a flu shot in October 2018.  She is a non-smoker.  Of note, she mentions that she has not been able to take her Lantus in 4 days because she has not been eating.  She denies hx of asthma/COPD.  Her PCP is Dr. Maricela Bo at the internal medicine residency.   She denies any fevers, ear pain or drainage, drooling, trismus, chest pain, shortness of breath at rest, abdominal pain, hematemesis, melena, hematochezia, constipation, obstipation, dysuria, hematuria, vaginal bleeding or discharge, numbness, tingling, focal weakness, or any other complaints at this time.   The history is provided by the patient and medical records. No language interpreter was used.  Cough  This is a new problem. The current episode started 2 days ago. The problem occurs constantly. The problem has not changed since onset.The cough is non-productive. There has been no fever. Associated symptoms include chills, rhinorrhea, sore throat, myalgias (body aches) and wheezing. Pertinent negatives include no chest pain,  no ear pain and no shortness of breath. She has tried cough syrup for the symptoms. The treatment provided mild relief. She is not a smoker. Her past medical history does not include asthma.    Past Medical History:  Diagnosis Date  . CKD (chronic kidney disease), stage III ([place])   . Cyclical vomiting    THC induced???  . Depression   . DKA (diabetic ketoacidoses) ([place]) 12/2014  . Erosive esophagitis   . Gastroparesis    ? diabetic  . GERD (gastroesophageal reflux disease)   . Human immunodeficiency virus (HIV) disease ([place]) dx'd 04/2016  . Hypertension   . Normocytic anemia   . Type II diabetes mellitus ([place]) 1994   diagnosed around 1994    Patient Active Problem List   Diagnosis Date Noted  . Right leg swelling 02/26/2017  . Superficial ulcerative lesion ([place]) 02/02/2017  . Constipation 12/03/2016  . Nausea & vomiting 11/22/2016  . Aortic atherosclerosis ([place]) 09/30/2016  . Orthostatic hypotension 09/30/2016  . Hypokalemia 09/30/2016  . Diabetic foot ulcer associated with diabetes mellitus due to underlying condition ([place]) 07/08/2016  . CKD (chronic kidney disease) stage 3, GFR 30-59 ml/min (H[facility]) 06/19/2016  . Human immunodeficiency virus (HIV) disease ([place]) 04/23/2016  . Contact dermatitis 04/21/2016  . Moderate nonproliferative diabetic retinopathy of both eyes ([place]) 11/21/2014  . Esophageal reflux   . Microscopic hematuria 10/31/2014  . Essential hypertension 11/16/2013  . Diabetic nephropathy ([place]) 12/07/2012  . TOBACCO USER 06/28/2006  . Depression 06/28/2006  . Diabetes mellitus type 2 in obese [facility]) 06/28/1994    Past Surgical History:  Procedure Laterality Date  .  TUBAL LIGATION  2002    OB History    No data available       Home Medications    Prior to Admission medications   Medication Sig Start Date End Date Taking? Authorizing Provider  amLODipine (NORVASC) 10 MG tablet TAKE ONE (1) TABLET BY MOUTH EVERY DAY 08/12/17   Chundi, Verne Spurr, MD    citalopram (CELEXA) 20 MG tablet Take 1 tablet (20 mg total) by mouth daily. 08/06/17 08/06/18  Jule Ser, DO  DESCOVY 200-25 MG tablet TAKE ONE (1) TABLET BY MOUTH EVERY DAY 07/13/17   Campbell Riches, MD  glucose blood (ACCU-CHEK GUIDE) test strip USE TO TEST BLOOD SUGAR 3 TIMES DAILY . Diagnosis code  E11.69, E66.9 07/09/17   Lars Mage, MD  Insulin Glargine (LANTUS) 100 UNIT/ML Solostar Pen Inject 30 Units into the skin daily at 10 pm. Patient taking differently: Inject 15 Units into the skin daily at 10 pm.  02/09/17   Ledell Noss, MD  metFORMIN (GLUCOPHAGE) 1000 MG tablet Take 1 tablet (1,000 mg total) by mouth daily with breakfast. 05/22/16   Riccardo Dubin, MD  metoCLOPramide (REGLAN) 5 MG tablet Take 1 tablet (5 mg total) by mouth 3 (three) times daily before meals. 08/06/17   Jule Ser, DO  ondansetron (ZOFRAN ODT) 4 MG disintegrating tablet Take 1 tablet (4 mg total) by mouth every 8 (eight) hours as needed for nausea or vomiting. 08/06/17   Jule Ser, DO  pantoprazole (PROTONIX) 40 MG tablet TAKE ONE (1) TABLET BY MOUTH EVERY DAY Patient not taking: Reported on 05/17/2017 12/02/16   Riccardo Dubin, MD  sennosides-docusate sodium (SENOKOT-S) 8.6-50 MG tablet Take 1 tablet by mouth daily. Patient not taking: Reported on 05/17/2017 12/02/16   Rivet, Sindy Guadel[place], MD  sodium bicarbonate 650 MG tablet TAKE ONE (1) TABLET BY MOUTH TWO (2) TIMES DAILY Patient not taking: Reported on 05/17/2017 12/02/16   Riccardo Dubin, MD  TIVICAY 50 MG tablet TAKE ONE (1) TABLET BY MOUTH EVERY DAY 07/13/17   Campbell Riches, MD  triamcinolone cream (KENALOG) 0.1 % Apply 1 application topically 2 (two) times daily. Patient taking differently: Apply 1 application topically 2 (two) times daily as needed (irritation).  03/27/16   Janne Napoleon, NP    Family History Family History  Problem Relation Age of Onset  . Diabetes Mother   . Diabetes Brother   . Diabetes Daughter   . Diabetes  Daughter   . Mental retardation Brother        died from PNA    Social History Social History   Tobacco Use  . Smoking status: Former Smoker    Packs/day: 0.10    Years: 10.00    Pack years: 1.00    Types: Cigarettes    Start date: 11/17/2013    Last attempt to quit: 09/09/2014    Years since quitting: 3.0  . Smokeless tobacco: Never Used  Substance Use Topics  . Alcohol use: Yes    Comment: 09/30/2016  "maybe birthdays or holidays; very occasional"  . Drug use: Yes    Frequency: 4.0 times per week    Types: Marijuana    Comment: 09/30/2016 "couple times/week"     Allergies   Patient has no known allergies.   Review of Systems Review of Systems  Constitutional: Positive for chills. Negative for fever.  HENT: Positive for congestion, rhinorrhea and sore throat. Negative for drooling, ear discharge and ear pain.   Respiratory: Positive for cough and  wheezing. Negative for shortness of breath.        +DOE  Cardiovascular: Negative for chest pain.  Gastrointestinal: Positive for diarrhea, nausea and vomiting. Negative for abdominal pain, blood in stool and constipation.  Genitourinary: Negative for dysuria, hematuria, vaginal bleeding and vaginal discharge.  Musculoskeletal: Positive for myalgias (body aches). Negative for arthralgias.  Skin: Negative for color change.  Allergic/Immunologic: Positive for immunocompromised state (HIV and DM2).  Neurological: Negative for weakness and numbness.  Psychiatric/Behavioral: Negative for confusion.   All other systems reviewed and are negative for acute change except as noted in the HPI.    Physical Exam Updated Vital Signs BP (!) 169/101 (BP Location: Right Arm)   Pulse 91   Temp 98.5 F (36.9 C) (Oral)   Resp 16   SpO2 100%   Physical Exam  Constitutional: She is oriented to person, place, and time. Vital signs are normal. She appears well-developed and well-nourished.  Non-toxic appearance. No distress.  Afebrile,  nontoxic, NAD  HENT:  Head: Normocephalic and atraumatic.  Nose: Mucosal edema present.  Mouth/Throat: Uvula is midline, oropharynx is clear and moist and mucous membranes are normal. No trismus in the jaw. No uvula swelling. Tonsils are 0 on the right. Tonsils are 0 on the left. No tonsillar exudate.  Nose mildly congested. Oropharynx clear and moist, without uvular swelling or deviation, no trismus or drooling, no tonsillar swelling or erythema, no exudates.    Eyes: Conjunctivae and EOM are normal. Right eye exhibits no discharge. Left eye exhibits no discharge.  Neck: Normal range of motion. Neck supple.  Cardiovascular: Normal rate, regular rhythm, normal heart sounds and intact distal pulses. Exam reveals no gallop and no friction rub.  No murmur heard. Pulmonary/Chest: Effort normal. No respiratory distress. She has no decreased breath sounds. She has wheezes. She has rhonchi. She has no rales.  Course rhonchi scattered throughout, mild expiratory wheezing throughout, no rales, no hypoxia or increased WOB, speaking in full sentences, SpO2 100% on RA   Abdominal: Soft. Normal appearance and bowel sounds are normal. She exhibits no distension. There is no tenderness. There is no rigidity, no rebound, no guarding, no CVA tenderness, no tenderness at McBurney's point and negative Murphy's sign.  Soft, NTND, +BS throughout, no r/g/r, neg murphy's, neg mcburney's, no CVA TTP   Musculoskeletal: Normal range of motion.  Neurological: She is alert and oriented to person, place, and time. She has normal strength. No sensory deficit.  Skin: Skin is warm, dry and intact. No rash noted.  Psychiatric: She has a normal mood and affect.  Nursing note and vitals reviewed.    ED Treatments / Results  Labs (all labs ordered are listed, but only abnormal results are displayed) Labs Reviewed  COMPREHENSIVE METABOLIC PANEL - Abnormal; Notable for the following components:      Result Value   Sodium 131  (*)    Chloride 96 (*)    CO2 21 (*)    Glucose, Bld 285 (*)    BUN 25 (*)    Creatinine, Ser 3.87 (*)    Calcium 8.4 (*)    Albumin 2.5 (*)    ALT 10 (*)    GFR calc non Af Amer 14 (*)    GFR calc Af Amer 16 (*)    All other components within normal limits  CBC - Abnormal; Notable for the following components:   WBC 3.5 (*)    RBC 3.39 (*)    Hemoglobin 10.7 (*)  HCT 31.5 (*)    All other components within normal limits  URINALYSIS, ROUTINE W REFLEX MICROSCOPIC - Abnormal; Notable for the following components:   Color, Urine AMBER (*)    APPearance CLOUDY (*)    Glucose, UA 150 (*)    Protein, ur >=300 (*)    Bacteria, UA RARE (*)    Squamous Epithelial / LPF 0-5 (*)    All other components within normal limits  URINE CULTURE  LIPASE, BLOOD  INFLUENZA PANEL BY PCR (TYPE A & B)  I-STAT BETA HCG BLOOD, ED ([facility], [facility], [facility] ONLY)  I-STAT CG4 LACTIC ACID, ED    EKG  EKG Interpretation None       Radiology Dg Chest 2 View  Result Date: 09/12/2017 CLINICAL DATA:  Left chest pain, cough and shortness of breath for the past 2 days. EXAM: CHEST  2 VIEW COMPARISON:  11/22/2016. FINDINGS: Normal sized heart. Clear lungs. Minimal diffuse peribronchial thickening. Normal appearing bones. IMPRESSION: Minimal bronchitic changes. Electronically Signed   By: Claudie Revering M.D.   On: 09/12/2017 18:55    Procedures Procedures (including critical care time)  CRITICAL CARE Performed by: Reece Agar   Total critical care time: 45 minutes  Critical care time was exclusive of separately billable procedures and treating other patients.  Critical care was necessary to treat or prevent imminent or life-threatening deterioration.  Critical care was time spent personally by me on the following activities: development of treatment plan with patient and/or surrogate as well as nursing, discussions with consultants, evaluation of patient's response to treatment, examination of patient,  obtaining history from patient or surrogate, ordering and performing treatments and interventions, ordering and review of laboratory studies, ordering and review of radiographic studies, pulse oximetry and re-evaluation of patient's condition.   Medications Ordered in ED Medications  levalbuterol (XOPENEX) nebulizer solution 1.25 mg (not administered)  doxycycline (VIBRA-TABS) tablet 100 mg (not administered)  sodium chloride 0.9 % bolus 2,000 mL (2,000 mLs Intravenous New Bag/Given 09/12/17 2100)  ondansetron (ZOFRAN) injection 4 mg (4 mg Intravenous Given 09/12/17 2100)  albuterol (PROVENTIL) (2.5 MG/3ML) 0.083% nebulizer solution 5 mg (5 mg Nebulization Given 09/12/17 2010)  ipratropium (ATROVENT) nebulizer solution 0.5 mg (0.5 mg Nebulization Given 09/12/17 2010)  acetaminophen (TYLENOL) tablet 650 mg (650 mg Oral Given 09/12/17 2120)  albuterol (PROVENTIL) (2.5 MG/3ML) 0.083% nebulizer solution 5 mg (5 mg Nebulization Given 09/12/17 2122)  ipratropium (ATROVENT) nebulizer solution 0.5 mg (0.5 mg Nebulization Given 09/12/17 2122)     Initial Impression / Assessment and Plan / ED Course  I have reviewed the triage vital signs and the nursing notes.  Pertinent labs & imaging results that were available during my care of the patient were reviewed by me and considered in my medical decision making (see chart for details).     42 y.o. female here with flu like symptoms x2 days. On exam, course rhonchi diffusely and mild expiratory wheezing throughout, throat clear, nose mildly congested, abd soft and nontender, afebrile and nontoxic. Work up thus far reveals: lactic WNL; betaHCG neg; U/A with 6-30 WBC/RBCs but nitrite/leuk neg and rare bacteria, will send for culture but looks like it's probably just vaginal contaminant contributing to these findings, pt has no symptoms of UTI; lipase WNL; CMP with mildly bumped Cr 3.87/BUN 25, glucose 285, otherwise remainder reassuring; CBC with mildly low WBC 3.5, and  stable anemia. Will get flu test and CXR, give fluids, zofran, and duoneb. Will reassess shortly.   9:20 PM  Several delays in getting things started, unfortunately. Flu test pending. CXR with minimal bronchitic changes, but no PNA noted; doubt PCP PNA. Will hold off on deciding on abx for bronchitis in this immunocompromised pt until after we get the flu test results. Lung sounds still rhonchorous and slightly wheezy after nebs, will repeat nebs now. Pt feeling somewhat better with fluids/zofran. Will reassess shortly.   10:11 PM Flu test negative; will give empiric doxycycline for bronchitis coverage in this immunocompromised pt. Lung sounds still fairly rhonchorous and slightly wheezy after second breathing tx, and HR in the 110-120s; SpO2 93-96% just sitting there. At this time, I feel pt would likely benefit from admission for aggressive pulmonary care, and her AKI. Will give xopenex to avoid any more tachycardia, and proceed with admission. Discussed case with my attending Dr. Lita Mains who agrees with plan.   10:21 PM Dr. Posey Pronto of IM residency service returning page and will admit. Holding orders to be placed by admitting team. Please see their notes for further documentation of care. I appreciate their help with this pleasant pt's care. Pt stable at time of admission.    Final Clinical Impressions(s) / ED Diagnoses   Final diagnoses:  Flu-like symptoms  Nausea vomiting and diarrhea  Cough  Bronchitis  AKI (acute kidney injury) ([place])  Chronic anemia  Type 2 diabetes mellitus with hyperglycemia, with long-term current use of insulin [facility])  History of HIV infection [facility])    ED Discharge Orders    [address] [place], [place] 09/12/17 2221    Julianne Rice, MD 09/16/17 [phone]

## 2017-09-13 ENCOUNTER — Encounter (HOSPITAL_COMMUNITY): Payer: Self-pay | Admitting: Internal Medicine

## 2017-09-13 ENCOUNTER — Other Ambulatory Visit: Payer: Self-pay

## 2017-09-13 DIAGNOSIS — J40 Bronchitis, not specified as acute or chronic: Secondary | ICD-10-CM | POA: Diagnosis present

## 2017-09-13 DIAGNOSIS — D72819 Decreased white blood cell count, unspecified: Secondary | ICD-10-CM | POA: Diagnosis present

## 2017-09-13 DIAGNOSIS — N183 Chronic kidney disease, stage 3 (moderate): Secondary | ICD-10-CM | POA: Diagnosis present

## 2017-09-13 DIAGNOSIS — E860 Dehydration: Secondary | ICD-10-CM | POA: Diagnosis present

## 2017-09-13 DIAGNOSIS — Z87891 Personal history of nicotine dependence: Secondary | ICD-10-CM | POA: Diagnosis not present

## 2017-09-13 DIAGNOSIS — Z794 Long term (current) use of insulin: Secondary | ICD-10-CM | POA: Diagnosis not present

## 2017-09-13 DIAGNOSIS — N179 Acute kidney failure, unspecified: Secondary | ICD-10-CM | POA: Diagnosis present

## 2017-09-13 DIAGNOSIS — Z79899 Other long term (current) drug therapy: Secondary | ICD-10-CM

## 2017-09-13 DIAGNOSIS — Z6829 Body mass index (BMI) 29.0-29.9, adult: Secondary | ICD-10-CM | POA: Diagnosis not present

## 2017-09-13 DIAGNOSIS — E669 Obesity, unspecified: Secondary | ICD-10-CM | POA: Diagnosis present

## 2017-09-13 DIAGNOSIS — B974 Respiratory syncytial virus as the cause of diseases classified elsewhere: Secondary | ICD-10-CM | POA: Diagnosis present

## 2017-09-13 DIAGNOSIS — J069 Acute upper respiratory infection, unspecified: Secondary | ICD-10-CM | POA: Diagnosis not present

## 2017-09-13 DIAGNOSIS — I129 Hypertensive chronic kidney disease with stage 1 through stage 4 chronic kidney disease, or unspecified chronic kidney disease: Secondary | ICD-10-CM | POA: Diagnosis present

## 2017-09-13 DIAGNOSIS — Z7984 Long term (current) use of oral hypoglycemic drugs: Secondary | ICD-10-CM | POA: Diagnosis not present

## 2017-09-13 DIAGNOSIS — K219 Gastro-esophageal reflux disease without esophagitis: Secondary | ICD-10-CM | POA: Diagnosis present

## 2017-09-13 DIAGNOSIS — F329 Major depressive disorder, single episode, unspecified: Secondary | ICD-10-CM | POA: Diagnosis present

## 2017-09-13 DIAGNOSIS — E113393 Type 2 diabetes mellitus with moderate nonproliferative diabetic retinopathy without macular edema, bilateral: Secondary | ICD-10-CM | POA: Diagnosis present

## 2017-09-13 DIAGNOSIS — D649 Anemia, unspecified: Secondary | ICD-10-CM | POA: Diagnosis present

## 2017-09-13 DIAGNOSIS — E1122 Type 2 diabetes mellitus with diabetic chronic kidney disease: Secondary | ICD-10-CM | POA: Diagnosis present

## 2017-09-13 DIAGNOSIS — E876 Hypokalemia: Secondary | ICD-10-CM | POA: Diagnosis present

## 2017-09-13 DIAGNOSIS — E1165 Type 2 diabetes mellitus with hyperglycemia: Secondary | ICD-10-CM | POA: Diagnosis present

## 2017-09-13 DIAGNOSIS — B20 Human immunodeficiency virus [HIV] disease: Secondary | ICD-10-CM | POA: Diagnosis present

## 2017-09-13 LAB — CBC WITH DIFFERENTIAL/PLATELET
Basophils Absolute: 0.1 10*3/uL (ref 0.0–0.1)
Basophils Relative: 2 %
Eosinophils Absolute: 0.1 10*3/uL (ref 0.0–0.7)
Eosinophils Relative: 2 %
HCT: 23.2 % — ABNORMAL LOW (ref 36.0–46.0)
Hemoglobin: 7.9 g/dL — ABNORMAL LOW (ref 12.0–15.0)
Lymphocytes Relative: 41 %
Lymphs Abs: 1.3 10*3/uL (ref 0.7–4.0)
MCH: 31.5 pg (ref 26.0–34.0)
MCHC: 34.1 g/dL (ref 30.0–36.0)
MCV: 92.4 fL (ref 78.0–100.0)
Monocytes Absolute: 0.3 10*3/uL (ref 0.1–1.0)
Monocytes Relative: 9 %
Neutro Abs: 1.5 10*3/uL — ABNORMAL LOW (ref 1.7–7.7)
Neutrophils Relative %: 46 %
Platelets: 165 10*3/uL (ref 150–400)
RBC: 2.51 MIL/uL — ABNORMAL LOW (ref 3.87–5.11)
RDW: 13.5 % (ref 11.5–15.5)
WBC: 3.3 10*3/uL — ABNORMAL LOW (ref 4.0–10.5)

## 2017-09-13 LAB — BASIC METABOLIC PANEL
Anion gap: 11 (ref 5–15)
Anion gap: 13 (ref 5–15)
BUN: 27 mg/dL — ABNORMAL HIGH (ref 6–20)
BUN: 26 mg/dL — ABNORMAL HIGH (ref 6–20)
CO2: 21 mmol/L — ABNORMAL LOW (ref 22–32)
CO2: 20 mmol/L — ABNORMAL LOW (ref 22–32)
Calcium: 7.6 mg/dL — ABNORMAL LOW (ref 8.9–10.3)
Calcium: 7.8 mg/dL — ABNORMAL LOW (ref 8.9–10.3)
Chloride: 101 mmol/L (ref 101–111)
Chloride: 105 mmol/L (ref 101–111)
Creatinine, Ser: 3.96 mg/dL — ABNORMAL HIGH (ref 0.44–1.00)
Creatinine, Ser: 3.89 mg/dL — ABNORMAL HIGH (ref 0.44–1.00)
GFR calc Af Amer: 15 mL/min — ABNORMAL LOW (ref >60)
GFR calc Af Amer: 16 mL/min — ABNORMAL LOW (ref >60)
GFR calc non Af Amer: 13 mL/min — ABNORMAL LOW (ref >60)
GFR calc non Af Amer: 13 mL/min — ABNORMAL LOW (ref >60)
Glucose, Bld: 395 mg/dL — ABNORMAL HIGH (ref 65–99)
Glucose, Bld: 180 mg/dL — ABNORMAL HIGH (ref 65–99)
Potassium: 2.9 mmol/L — ABNORMAL LOW (ref 3.5–5.1)
Potassium: 3.1 mmol/L — ABNORMAL LOW (ref 3.5–5.1)
Sodium: 133 mmol/L — ABNORMAL LOW (ref 135–145)
Sodium: 138 mmol/L (ref 135–145)

## 2017-09-13 LAB — GLUCOSE, CAPILLARY
Glucose-Capillary: 513 mg/dL (ref 65–99)
Glucose-Capillary: 382 mg/dL — ABNORMAL HIGH (ref 65–99)
Glucose-Capillary: 144 mg/dL — ABNORMAL HIGH (ref 65–99)
Glucose-Capillary: 163 mg/dL — ABNORMAL HIGH (ref 65–99)
Glucose-Capillary: 145 mg/dL — ABNORMAL HIGH (ref 65–99)
Glucose-Capillary: 137 mg/dL — ABNORMAL HIGH (ref 65–99)

## 2017-09-13 LAB — RESPIRATORY PANEL BY PCR

## 2017-09-13 LAB — SAVE SMEAR

## 2017-09-13 LAB — T-HELPER CELLS (CD4) COUNT (NOT AT ARMC)
CD4 % Helper T Cell: 33 % (ref 33–55)
CD4 T Cell Abs: 440 /uL (ref 400–2700)

## 2017-09-13 LAB — HEMOGLOBIN A1C
Hgb A1c MFr Bld: 9 % — ABNORMAL HIGH (ref 4.8–5.6)
Mean Plasma Glucose: 211.6 mg/dL

## 2017-09-13 MED ORDER — INSULIN ASPART 100 UNIT/ML ~~LOC~~ SOLN
5.0000 [IU] | Freq: Once | SUBCUTANEOUS | Status: AC
  Administered 2017-09-13: 5 [IU] via SUBCUTANEOUS

## 2017-09-13 MED ORDER — INSULIN ASPART 100 UNIT/ML ~~LOC~~ SOLN
10.0000 [IU] | Freq: Once | SUBCUTANEOUS | Status: AC
  Administered 2017-09-13: 10 [IU] via SUBCUTANEOUS

## 2017-09-13 MED ORDER — GUAIFENESIN-DM 100-10 MG/5ML PO SYRP: 5.0000 mL | ORAL_SOLUTION | ORAL | Status: DC | PRN

## 2017-09-13 MED ORDER — TRAMADOL HCL 50 MG PO TABS
50.0000 mg | ORAL_TABLET | Freq: Two times a day (BID) | ORAL | Status: DC | PRN
  Administered 2017-09-13 – 2017-09-14 (×2): 50 mg via ORAL
  Filled 2017-09-13 (×2): qty 1

## 2017-09-13 MED ORDER — AMLODIPINE BESYLATE 10 MG PO TABS
10.0000 mg | ORAL_TABLET | Freq: Every day | ORAL | Status: DC
  Administered 2017-09-13 – 2017-09-14 (×2): 10 mg via ORAL
  Filled 2017-09-13 (×2): qty 1

## 2017-09-13 MED ORDER — POTASSIUM CHLORIDE CRYS ER 20 MEQ PO TBCR
40.0000 meq | EXTENDED_RELEASE_TABLET | Freq: Four times a day (QID) | ORAL | Status: AC
  Administered 2017-09-13 (×2): 40 meq via ORAL
  Filled 2017-09-13 (×2): qty 2

## 2017-09-13 NOTE — Progress Notes (Signed)
New orders placed for Novolog 10 units, RN instructed to recheck CBG in 2 hours.

## 2017-09-13 NOTE — Progress Notes (Signed)
   Subjective: Admitted overnight with no acute events following, resting in bed this morning. She tolerated breakfast well with no vomiting, nausea improved. Cough and respiratory symptoms also improved but still present.   Objective:  Vital signs in last 24 hours: Vitals:   09/13/17 0231 09/13/17 0300 09/13/17 0522 09/13/17 0846  BP:   (!) 153/72   Pulse:   (!) 101   Resp:  18 20   Temp:   98 F (36.7 C)   TempSrc:   Oral   SpO2: 96%  94% 98%  Weight:   176 lb 9.4 oz (80.1 kg)   Height:       General: Resting in bed comfortably, no acute distress HEENT: Normal conjuctiva CV: RRR, no murmur appreciated Resp: Slight basilar crackles, otherwise clear breath sounds, normal work of breathing, no distress  Abd: Soft, +BS, non-tender to palpation  Extr: No LE edema, good peripheral pulses Neuro: Alert and oriented x3 Skin: Warm, dry, normal skin turgor      Assessment/Plan:  Respiratory Syncytial Virus Pt presented with sx consistent with a flu-like viral illness, influenza negative, subsequent RVP positive for RSV. Received one dose of doxycycline on admission. Will continue supportive care and symptom management as needed.  --Monitor vital signs --Tylenol prn  --Albuterol q6h prn    Acute on Chronic Kidney Injury Cr on presentation 3.87 compared to a baseline of 2.5-2.8, likely pre-renal due to nausea, vomiting and poor PO intake related to her viral illness. She received bolus IVF followed by maintenance with K added for hypokalemia.  --Avoid nephrotoxins, BMP --Cont IVF    Leukopenia Pt has leukopenia with WBC 3.3, likely due to myelosuppression with acute viral infection. Will continue to monitor --CBC   H/o HIV Reports adherence with ART as an outpatient, last CD4 390, repeat thsi admission pending.  --Cont Tivicay and Descovy     Dispo: Anticipated discharge in approximately 1-2 day(s).   Tawny Asal, MD 09/13/2017, 11:06 AM Pager: [phone]

## 2017-09-13 NOTE — Progress Notes (Addendum)
Novolog 10 units given as ordered. CBG 2 hours later 382. MD notified. Verbal order with read back given by MD for 5 units of Novolog and recheck blood sugar before breakfast.  Pt resting comfortably, no signs of distress.

## 2017-09-13 NOTE — Progress Notes (Signed)
Pt CBG checked 513. Lantus 10 units given as scheduled. MD notified.

## 2017-09-13 NOTE — Progress Notes (Signed)
Inpatient Diabetes Program Recommendations  AACE/ADA: New Consensus Statement on Inpatient Glycemic Control (2015)  Target Ranges:  Prepandial:   less than 140 mg/dL      Peak postprandial:   less than 180 mg/dL (1-2 hours)      Critically ill patients:  140 - 180 mg/dL   Lab Results  Component Value Date   GLUCAP 163 (H) 09/13/2017   HGBA1C 9.0 (H) 09/13/2017    Review of Glycemic Control  Diabetes history: DM2 Outpatient Diabetes medications: Lantus 15 units qd + Metformin 1 gm qd Current orders for Inpatient glycemic control: Lantus 10 units + Novolog sensitive correction scale tid  Inpatient Diabetes Program Recommendations:   -D/C Metformin on discharge home due to GFR Will follow during hospitalization.  Thank you, Nani Gasser. Shaletta Hinostroza, RN, MSN, CDE  Diabetes Coordinator Inpatient Glycemic Control Team Team Pager [phone] (8am-5pm) 09/13/2017 12:25 PM

## 2017-09-13 NOTE — Progress Notes (Signed)
Pt arrived to [place] 20. Ambulated from stretcher to bed, study gait. Alert and oriented x 4, VS stable, no signs of acute distress. Pt identified appropriately, oriented to room and equipment. Instructed to use call bell for assistance, call bell left within reach. Will continue to monitor pt and treat per MD orders.

## 2017-09-13 NOTE — Progress Notes (Signed)
CRITICAL VALUE ALERT  Critical Value:  Resp Panel +RSV  Date & Time Notied:  09/13/17 @ 1020  Provider Notified: MD paged  Orders Received/Actions taken: Pt currently on droplet precautions; will await any new orders

## 2017-09-14 ENCOUNTER — Encounter: Payer: Self-pay | Admitting: Internal Medicine

## 2017-09-14 ENCOUNTER — Encounter (HOSPITAL_COMMUNITY): Payer: Self-pay

## 2017-09-14 LAB — CBC
HCT: 24.2 % — ABNORMAL LOW (ref 36.0–46.0)
Hemoglobin: 8.3 g/dL — ABNORMAL LOW (ref 12.0–15.0)
MCH: 31.7 pg (ref 26.0–34.0)
MCHC: 34.3 g/dL (ref 30.0–36.0)
MCV: 92.4 fL (ref 78.0–100.0)
Platelets: 205 10*3/uL (ref 150–400)
RBC: 2.62 MIL/uL — ABNORMAL LOW (ref 3.87–5.11)
RDW: 13.9 % (ref 11.5–15.5)
WBC: 3.8 10*3/uL — ABNORMAL LOW (ref 4.0–10.5)

## 2017-09-14 LAB — BASIC METABOLIC PANEL
Anion gap: 9 (ref 5–15)
BUN: 20 mg/dL (ref 6–20)
CO2: 18 mmol/L — ABNORMAL LOW (ref 22–32)
Calcium: 7.9 mg/dL — ABNORMAL LOW (ref 8.9–10.3)
Chloride: 110 mmol/L (ref 101–111)
Creatinine, Ser: 3.19 mg/dL — ABNORMAL HIGH (ref 0.44–1.00)
GFR calc Af Amer: 20 mL/min — ABNORMAL LOW (ref >60)
GFR calc non Af Amer: 17 mL/min — ABNORMAL LOW (ref >60)
Glucose, Bld: 109 mg/dL — ABNORMAL HIGH (ref 65–99)
Potassium: 4 mmol/L (ref 3.5–5.1)
Sodium: 137 mmol/L (ref 135–145)

## 2017-09-14 LAB — HIV-1 RNA QUANT-NO REFLEX-BLD
HIV 1 RNA Quant: <20 copies/mL
LOG10 HIV-1 RNA: UNDETERMINED log10copy/mL

## 2017-09-14 LAB — GLUCOSE, CAPILLARY
Glucose-Capillary: 77 mg/dL (ref 65–99)
Glucose-Capillary: 103 mg/dL — ABNORMAL HIGH (ref 65–99)

## 2017-09-14 MED ORDER — LACTATED RINGERS IV SOLN
INTRAVENOUS | Status: DC
  Administered 2017-09-14: 08:00:00 via INTRAVENOUS

## 2017-09-14 MED ORDER — INSULIN GLARGINE 100 UNIT/ML SOLOSTAR PEN: 15.0000 [IU] | PEN_INJECTOR | Freq: Every day | SUBCUTANEOUS | Status: DC

## 2017-09-14 NOTE — Progress Notes (Signed)
Pt given discharge instructions, education packet and care notes. Pt verbalized understanding AEB no further questions or concerns at this time. No IV access.  Pt left the floor via wheelchair with staff in stable condition.

## 2017-09-14 NOTE — Plan of Care (Signed)
Progressing

## 2017-09-14 NOTE — Discharge Summary (Signed)
 Name: Kathy Frank MRN: [id number] DOB: 08/20/1975 41 y.o. PCP: Chundi, Vahini, MD  Date of Admission: 09/12/2017  5:21 PM Date of Discharge: 09/14/2017 Attending Physician: Granfortuna, James M, MD  Discharge Diagnosis: Principal Problem:   Acute-on-chronic kidney injury (HCC) Active Problems:   Diabetes mellitus type 2 in obese (HCC)   Depression   Essential hypertension   Human immunodeficiency virus (HIV) disease (HCC)   Flu-like symptoms   RSV (respiratory syncytial virus infection)   Discharge Medications: Allergies as of 09/14/2017   No Known Allergies     Medication List    STOP taking these medications   metFORMIN 1000 MG tablet Commonly known as:  GLUCOPHAGE     TAKE these medications   amLODipine 10 MG tablet Commonly known as:  NORVASC TAKE ONE (1) TABLET BY MOUTH EVERY DAY   citalopram 20 MG tablet Commonly known as:  CELEXA Take 1 tablet (20 mg total) by mouth daily.   DESCOVY 200-25 MG tablet Generic drug:  emtricitabine-tenofovir AF TAKE ONE (1) TABLET BY MOUTH EVERY DAY   glucose blood test strip Commonly known as:  ACCU-CHEK GUIDE USE TO TEST BLOOD SUGAR 3 TIMES DAILY . Diagnosis code  E11.69, E66.9   Insulin Glargine 100 UNIT/ML Solostar Pen Commonly known as:  LANTUS Inject 15 Units into the skin daily at 10 pm.   metoCLOPramide 5 MG tablet Commonly known as:  REGLAN Take 1 tablet (5 mg total) by mouth 3 (three) times daily before meals. What changed:    when to take this  reasons to take this   multivitamin with minerals Tabs tablet Take 1 tablet by mouth daily.   ondansetron 4 MG disintegrating tablet Commonly known as:  ZOFRAN ODT Take 1 tablet (4 mg total) by mouth every 8 (eight) hours as needed for nausea or vomiting.   pantoprazole 40 MG tablet Commonly known as:  PROTONIX TAKE ONE (1) TABLET BY MOUTH EVERY DAY   sennosides-docusate sodium 8.6-50 MG tablet Commonly known as:  SENOKOT-S Take 1 tablet by mouth  daily. What changed:    when to take this  reasons to take this   sodium bicarbonate 650 MG tablet TAKE ONE (1) TABLET BY MOUTH TWO (2) TIMES DAILY   TIVICAY 50 MG tablet Generic drug:  dolutegravir TAKE ONE (1) TABLET BY MOUTH EVERY DAY   triamcinolone cream 0.1 % Commonly known as:  KENALOG Apply 1 application topically 2 (two) times daily. What changed:    when to take this  reasons to take this       Disposition and follow-up:   [name] was discharged from [facility] in Stable condition.  At the hospital follow up visit please address:  1.  -Assess for resolution of flu like symptoms and ability to maintain adequate PO hydration  -Repeat labs to ensure her Cr, WBC, and Hgb have continued to return to baseline.  -Based on her kidney function, Metformin was held on discharge. Assess if this should be permanently discontinued   2.  Labs / imaging needed at time of follow-up: BMP, CBC   3.  Pending labs/ test needing follow-up: None  Follow-up Appointments:   Hospital Course by problem list:  Respiratory Syncytial Virus She presented with a 4 day history of symptoms including cough, myalgias, n/v/d, and fever consistent with a flu-like illness. Influenza testing was negative and subsequent respiratory virus panel was positive for RSV. She was treated with supportive care and IVF and improved   over the course of admission.  Acute on Chronic Kidney Injury (CKD III) Her Cr on presentation was 3.87 compared to a baseline of 2.5-2.8. This was felt to be due to pre-renal cause due to nausea, vomiting, and poor PO intake related to her viral illness sx. She received bolus IVF followed by maintenance. Her PO intake also improved and was she adequately hydrating orally prior to discharge. Her Cr was down-trending, last at 3.19, and should be re-checked on discharge.   Leukopenia Pt noted to have leukopenia with WBC 3.3, likely due to  myelosuppression in the setting of acute viral infection. This was also trending toward normalization prior to discharge but can be re-checked on follow up to ensure resolution.    H/o HIV Pt reported adherence with prescribed HAART regimen, repeat CD4 count was obtained on admission which was 440 compared to 390 in June 2018. Her regimen was continued during the admission.   H/o Diabetes She has a history of diabetes with potential gastroparesis on Lantus and metformin as an outpatient. She had appropriately held her Lantus prior to admission due to her decreased PO intake. Her metformin was held on admission and on discharge due to her renal function (eGFR 20 on labs prior to discharge). If her renal function recovers on f/u this may be able to be resumed with caution. Her Lantus was continued at discharge with updated dose c/w pt's reported dose (15 U nightly).   Discharge Vitals:   BP (!) 146/84 (BP Location: Left Arm)   Pulse 83   Temp 98.1 F (36.7 C) (Oral)   Resp 16   Ht 5' 5" (1.651 m)   Wt 176 lb 9.4 oz (80.1 kg)   LMP 08/27/2017   SpO2 98%   BMI 29.39 kg/m   Pertinent Labs, Studies, and Procedures:  BMP Latest Ref Rng & Units 09/14/2017 09/13/2017 09/13/2017  Glucose 65 - 99 mg/dL 109(H) 180(H) 395(H)  BUN 6 - 20 mg/dL 20 26(H) 27(H)  Creatinine 0.44 - 1.00 mg/dL 3.19(H) 3.89(H) 3.96(H)  BUN/Creat Ratio 9 - 23 - - -  Sodium 135 - 145 mmol/L 137 138 133(L)  Potassium 3.5 - 5.1 mmol/L 4.0 3.1(L) 2.9(L)  Chloride 101 - 111 mmol/L 110 105 101  CO2 22 - 32 mmol/L 18(L) 20(L) 21(L)  Calcium 8.9 - 10.3 mg/dL 7.9(L) 7.8(L) 7.6(L)   CBC Latest Ref Rng & Units 09/14/2017 09/13/2017 09/12/2017  WBC 4.0 - 10.5 K/uL 3.8(L) 3.3(L) 3.5(L)  Hemoglobin 12.0 - 15.0 g/dL 8.3(L) 7.9(L) 10.7(L)  Hematocrit 36.0 - 46.0 % 24.2(L) 23.2(L) 31.5(L)  Platelets 150 - 400 K/uL 205 165 214    Discharge Instructions: Discharge Instructions    Discharge instructions   Complete by:  As directed    It was a  pleasure to meet you Ms. [place] should continue to improve with time and should be past the worst of this virus. Be sure to drink plenty of fluids to stay hydrated  -You can use over the counter medicines for cough and congestion as you need them for any lingering symptoms -We would like to see you in the clinic soon to check in and make sure you are recovering as expected and to repeat labs to check in on your kidney function.  -Stop taking your Metformin for now. We will see if you should restart this or stay off at a follow up appointment   Increase activity slowly   Complete by:  As directed  Signed: , , MD 09/14/2017, 2:45 PM   Pager: [phone]  

## 2017-09-14 NOTE — Progress Notes (Signed)
   Subjective: No acute events overnight, pt reports she feels improved overall, but still feeling fatigued with some subjective fever--normal temperatures recorded with vital signs. She is tolerating PO intake well.   Objective:  Vital signs in last 24 hours: Vitals:   09/13/17 0846 09/13/17 1356 09/13/17 2140 09/14/17 0522  BP:  (!) 146/78 (!) 150/87 (!) 160/78  Pulse:  95 88 81  Resp:  18 18 18   Temp:  98.6 F (37 C) 98.2 F (36.8 C) 97.8 F (36.6 C)  TempSrc:  Oral Oral   SpO2: 98% 100% 98% 99%  Weight:    176 lb 9.4 oz (80.1 kg)  Height:       General: Resting in bed comfortably, no acute distress HEENT: Normal conjuctiva CV: RRR, no murmur appreciated Resp: Clear breath sounds bilaterally, normal work of breathing, no distress  Abd: Soft, +BS, non-tender to palpation  Extr: No LE edema Neuro: Alert and oriented x3 Skin: Warm, dry   Assessment/Plan:  Respiratory Syncytial Virus Pt presented with sx consistent with a flu-like viral illness, influenza negative, subsequent RVP positive for RSV. Received one dose of doxycycline on admission. She has improved with supportive care during the admission.   --Monitor vital signs --Tylenol, Tramadol prn--discontinue on discharge   --Albuterol q6h prn    Acute on Chronic Kidney Injury Cr on presentation 3.87 compared to a baseline of 2.5-2.8, likely pre-renal due to nausea, vomiting and poor PO intake related to her viral illness. She received bolus IVF followed by maintenance with K added for hypokalemia. Her PO intake has improved and she can likely maintain adequate hydration orally. Her Cr is downtrending, currently 3.19.  --Avoid nephrotoxins, BMP --Encourage PO intake   Leukopenia Pt presented with leukopenia with WBC 3.3, likely due to myelosuppression with acute viral infection, improving.   H/o HIV Reports adherence with ART as an outpatient, last CD4 390, repeat this admission 440.  --Cont Tivicay and Descovy      Dispo: Anticipated discharge in approximately 0-1 day(s).   Tawny Asal, MD 09/14/2017, 7:08 AM Pager: [phone]

## 2017-09-15 ENCOUNTER — Telehealth: Payer: Self-pay | Admitting: Internal Medicine

## 2017-09-15 NOTE — Telephone Encounter (Signed)
TOC HFU APPT ON 09/17/2017 WITH DR Maricela Bo

## 2017-09-17 ENCOUNTER — Ambulatory Visit: Payer: Medicaid Other | Admitting: Internal Medicine

## 2017-09-17 ENCOUNTER — Encounter: Payer: Self-pay | Admitting: Internal Medicine

## 2017-09-17 ENCOUNTER — Encounter (INDEPENDENT_AMBULATORY_CARE_PROVIDER_SITE_OTHER): Payer: Self-pay

## 2017-09-17 VITALS — BP 169/96 | HR 87 | Temp 98.1°F | Wt 166.5 lb

## 2017-09-17 DIAGNOSIS — E1122 Type 2 diabetes mellitus with diabetic chronic kidney disease: Secondary | ICD-10-CM | POA: Diagnosis not present

## 2017-09-17 DIAGNOSIS — F32A Depression, unspecified: Secondary | ICD-10-CM

## 2017-09-17 DIAGNOSIS — Z6827 Body mass index (BMI) 27.0-27.9, adult: Secondary | ICD-10-CM | POA: Diagnosis not present

## 2017-09-17 DIAGNOSIS — N183 Chronic kidney disease, stage 3 unspecified: Secondary | ICD-10-CM

## 2017-09-17 DIAGNOSIS — I10 Essential (primary) hypertension: Secondary | ICD-10-CM

## 2017-09-17 DIAGNOSIS — B20 Human immunodeficiency virus [HIV] disease: Secondary | ICD-10-CM | POA: Diagnosis not present

## 2017-09-17 DIAGNOSIS — R197 Diarrhea, unspecified: Secondary | ICD-10-CM

## 2017-09-17 DIAGNOSIS — R1031 Right lower quadrant pain: Secondary | ICD-10-CM

## 2017-09-17 DIAGNOSIS — M62838 Other muscle spasm: Secondary | ICD-10-CM

## 2017-09-17 DIAGNOSIS — E669 Obesity, unspecified: Secondary | ICD-10-CM | POA: Diagnosis not present

## 2017-09-17 DIAGNOSIS — Z8619 Personal history of other infectious and parasitic diseases: Secondary | ICD-10-CM

## 2017-09-17 DIAGNOSIS — Z79899 Other long term (current) drug therapy: Secondary | ICD-10-CM

## 2017-09-17 DIAGNOSIS — R1011 Right upper quadrant pain: Secondary | ICD-10-CM | POA: Diagnosis not present

## 2017-09-17 DIAGNOSIS — I129 Hypertensive chronic kidney disease with stage 1 through stage 4 chronic kidney disease, or unspecified chronic kidney disease: Secondary | ICD-10-CM

## 2017-09-17 DIAGNOSIS — Z23 Encounter for immunization: Secondary | ICD-10-CM

## 2017-09-17 DIAGNOSIS — F329 Major depressive disorder, single episode, unspecified: Secondary | ICD-10-CM

## 2017-09-17 DIAGNOSIS — R05 Cough: Secondary | ICD-10-CM

## 2017-09-17 DIAGNOSIS — E1169 Type 2 diabetes mellitus with other specified complication: Secondary | ICD-10-CM | POA: Diagnosis not present

## 2017-09-17 DIAGNOSIS — Z794 Long term (current) use of insulin: Secondary | ICD-10-CM | POA: Diagnosis not present

## 2017-09-17 DIAGNOSIS — R6889 Other general symptoms and signs: Secondary | ICD-10-CM

## 2017-09-17 DIAGNOSIS — E1121 Type 2 diabetes mellitus with diabetic nephropathy: Secondary | ICD-10-CM

## 2017-09-17 DIAGNOSIS — K219 Gastro-esophageal reflux disease without esophagitis: Secondary | ICD-10-CM

## 2017-09-17 DIAGNOSIS — Z0000 Encounter for general adult medical examination without abnormal findings: Secondary | ICD-10-CM | POA: Insufficient documentation

## 2017-09-17 DIAGNOSIS — F339 Major depressive disorder, recurrent, unspecified: Secondary | ICD-10-CM

## 2017-09-17 MED ORDER — FUROSEMIDE 40 MG PO TABS: 40.0000 mg | ORAL_TABLET | Freq: Every day | ORAL | 2 refills | Status: DC

## 2017-09-17 MED ORDER — DICLOFENAC SODIUM 1 % TD GEL: 2.0000 g | Freq: Four times a day (QID) | TRANSDERMAL | 0 refills | Status: DC

## 2017-09-17 MED ORDER — LIRAGLUTIDE 18 MG/3ML ~~LOC~~ SOPN: 0.6000 mg | PEN_INJECTOR | Freq: Every day | SUBCUTANEOUS | 1 refills | Status: DC

## 2017-09-17 NOTE — Patient Instructions (Addendum)
It was a pleasure to see you today Ms. Gossen. Please make the following changes:  -Please start taking lasix 40mg  daily -Please start taking 0.6mg  liraglutide once daily  -Please go to eye doctor appointment -Please drink plenty of fluids -Use coricidin for cough -Use imodium for diarrhea  If you have any questions or concerns, please call our clinic at [phone] between 9am-5pm and after hours call [phone] and ask for the internal medicine resident on call. If you feel you are having a medical emergency please call 911.   Thank you, we look forward to help you remain healthy!  Lars Mage, MD Internal Medicine PGY1

## 2017-09-17 NOTE — Assessment & Plan Note (Signed)
Assessment The patient's last a1c was checked on 09/13/17 during which time it was 9.0. The patient is currently taking lantus 15 units qhs. Her metformin was held during her recent hospitalization due to her decrease in renal function. Since the patient's GFR is 20, will stop the patient's metformin. The patient will likely need additional anti hyperglycemic agent. The patient on victoza due to its antihyperglycemic benefit and cardiovascular benefits.  Plan Started patient on victoza 0.6mg  daily

## 2017-09-17 NOTE — Assessment & Plan Note (Signed)
Assessment The patient was recently admitted from 09/12/17-09/14/17 for acute on chronic kidney injury due to pre-renal cause from poor po intake and nausea and vomiting secondary to her upper respiratory viral illness (rsv). The patient's baseline is 2.5-2.8 and was last noted to be 3.19. Will get BMP to recheck how the patient's kidney function is doing .  Plan -Recheck BMP

## 2017-09-17 NOTE — Progress Notes (Signed)
   CC: Hypertension follow up  HPI:  [name] is a 42 y.o. female with HIV, essential hypertension, type 2 diabetes mellitus, ckd 3, and gerd for blood pressure follow up. Please see problem based charting for evaluation, assessment, and plan.   Past Medical History:  Diagnosis Date  . CKD (chronic kidney disease) stage 3, GFR 30-59 ml/min (HCC) 06/19/2016   06/30/16: Seen by Dr. Posey Pronto [Nephrology]. Suspect etiologies to include diabetes and hypertension though cannot exclude HIV-associated nephropathy. Advised her against from using excessive Goody powder.  . CKD (chronic kidney disease), stage III ([place])   . Contact dermatitis 04/21/2016   07/29/16: Skin biopsy performed by [facility] notable for acute spongiotic dermatitis consistent with contact dermatitis.  . Cyclical vomiting    THC induced???  . Depression   . Depression 06/28/2006   Qualifier: Diagnosis of  By: Riccardo Dubin MD, Sherren Mocha    . Diabetes mellitus type 2 in obese ([place]) 06/28/1994  . DKA (diabetic ketoacidoses) ([place]) 12/2014  . Erosive esophagitis   . Esophageal reflux   . Essential hypertension 11/16/2013  . Gastroparesis    ? diabetic  . GERD (gastroesophageal reflux disease)   . Human immunodeficiency virus (HIV) disease ([place]) dx'd 04/2016  . Human immunodeficiency virus (HIV) disease ([place]) 04/23/2016  . Hypertension   . Moderate nonproliferative diabetic retinopathy of both eyes ([place]) 11/21/2014   11/14/14: Noted on retinal imaging; needs follow-up imaging in 6 months  05/22/16: Noted on retinal imaging again; needs follow-up imaging in 6 months  . Normocytic anemia   . TOBACCO USER 06/28/2006   Qualifier: Diagnosis of  By: Riccardo Dubin MD, Sherren Mocha    . Type II diabetes mellitus ([place]) 1994   diagnosed around 1994   Review of Systems:   Cough, runny nose, sore throat, diarrhea  Physical Exam:  Vitals:   09/17/17 1356 09/17/17 1425  BP: (!) 183/91 (!) 169/96  Pulse: 95 87  Temp: 98.1 F (36.7 C)     TempSrc: Oral   SpO2: 100%   Weight: 166 lb 8 oz (75.5 kg)    Physical Exam  Constitutional: She appears well-developed and well-nourished. No distress.  HENT:  Head: Normocephalic and atraumatic.  Eyes: Conjunctivae are normal.  Cardiovascular: Normal rate, regular rhythm and normal heart sounds.  Respiratory: Effort normal and breath sounds normal. No respiratory distress. She has no wheezes.  GI: Soft. Bowel sounds are normal. She exhibits no distension. There is no tenderness.  Musculoskeletal: Normal range of motion. She exhibits tenderness (tenderness to palpation in the right lower thoracic/right upper quadrant area). She exhibits no edema.  Neurological: She is alert.  Skin: She is not diaphoretic. No erythema.  Psychiatric: She has a normal mood and affect. Her behavior is normal. Judgment and thought content normal.    Assessment & Plan:   See Encounters Tab for problem based charting.  Patient discussed with Dr. Angelia Mould

## 2017-09-17 NOTE — Assessment & Plan Note (Signed)
Assessment The patient's blood pressure during this visit was 183/91 and repeat was 169/96. The patient takes amlodipine 10mg  daily. Since the patient's blood pressure is not well controlled and she has concomitant CKD will start the patient on furosemide 40mg  qd.  Plan Started patient on furosemide 40mg  qd

## 2017-09-17 NOTE — Assessment & Plan Note (Deleted)
Assessment The patient was recently started on celexa 20mg  daily. Zoloft and trazodone were discontinued as the patient had not taken trazodone in several months and she stated that the zoloft was causing gi discomfort. The patient states that she is doing well on celexa and her mood is doing well.   Plan -Continue Celexa 20mg  daily

## 2017-09-17 NOTE — Assessment & Plan Note (Signed)
Assessment The patient was recently started on celexa 20mg  daily. Zoloft and trazodone were discontinued as the patient had not taken trazodone in several months and she stated that the zoloft was causing gi discomfort. The patient states that she is doing well on celexa and her mood is doing well.   Plan -Continue Celexa 20mg  daily

## 2017-09-17 NOTE — Assessment & Plan Note (Addendum)
Patient states that she has been having a 1 week history of dry cough, runny nose, sore throat.  He was recently admitted from 09/12/2017 2 09/14/2017 for RSV infection.  The patient is likely to continue having the cough for another 1-2 weeks post a viral infection.  She does not need any antibiotics at this time.  Patient also has diarrhea for the past week that has been occurring 3-4 times per day.  He describes her stool to be watery, urgency when having to defecate.  This is likely secondary to her viral illness.  Symptomatic therapy recommended.  Plan -Recommended the patient to take coricidin for cough as needed -There are with rest and fluids -Imodium as needed for diarrhea

## 2017-09-17 NOTE — Assessment & Plan Note (Signed)
The patient stated that she will get her pap smear at next visit -Ordered ophthalmology referral -TDAP was given

## 2017-09-18 DIAGNOSIS — M62838 Other muscle spasm: Secondary | ICD-10-CM | POA: Insufficient documentation

## 2017-09-18 NOTE — Progress Notes (Signed)
Internal Medicine Clinic Attending  Case discussed with Dr. Chundi at the time of the visit.  We reviewed the resident's history and exam and pertinent patient test results.  I agree with the assessment, diagnosis, and plan of care documented in the resident's note. 

## 2017-09-18 NOTE — Assessment & Plan Note (Signed)
Assessment Patient reports that she has been having pain in her right lower thoracic region/right upper abdominal quadrant. The patient describes the pain as 10/10 intensity, achy in nature, and nonradiating. She states that the pain gets worse when she coughs. She has not had any recent trauma and has not lifted any heavy objects.   The patient had a chest x-ray done on 09/12/17 that did not show any rib fractures. The patient had tenderness to palpation on exam. The pain is likely due to muscle spasm.   Plan -treat with voltaren gel -Encouraged patient to apply heat to area

## 2017-10-11 NOTE — Telephone Encounter (Signed)
Seen on 09/17/17 as scheduled for HFU.Despina Hidden Cassady3/4/20194:04 PM

## 2017-10-21 ENCOUNTER — Other Ambulatory Visit: Payer: Self-pay | Admitting: Internal Medicine

## 2017-10-21 DIAGNOSIS — I10 Essential (primary) hypertension: Secondary | ICD-10-CM

## 2017-11-02 ENCOUNTER — Other Ambulatory Visit: Payer: Medicaid Other

## 2017-11-12 ENCOUNTER — Other Ambulatory Visit: Payer: Medicaid Other

## 2017-11-17 ENCOUNTER — Ambulatory Visit: Payer: Medicaid Other | Admitting: Infectious Diseases

## 2017-12-03 ENCOUNTER — Other Ambulatory Visit: Payer: Self-pay | Admitting: Internal Medicine

## 2017-12-03 ENCOUNTER — Encounter: Payer: Self-pay | Admitting: Internal Medicine

## 2017-12-03 DIAGNOSIS — E669 Obesity, unspecified: Principal | ICD-10-CM

## 2017-12-03 DIAGNOSIS — E1169 Type 2 diabetes mellitus with other specified complication: Secondary | ICD-10-CM

## 2017-12-08 NOTE — Progress Notes (Signed)
FYI: Faxed a completed Referral form to [facility] to help with the patient's order.  Form was rec'd patient is eligible for services per her Medicaid.  Spoke with Bryson Dames at [facility] who will be following this request for services.

## 2017-12-23 ENCOUNTER — Ambulatory Visit (INDEPENDENT_AMBULATORY_CARE_PROVIDER_SITE_OTHER): Payer: Medicaid Other | Admitting: Internal Medicine

## 2017-12-23 ENCOUNTER — Other Ambulatory Visit: Payer: Self-pay

## 2017-12-23 ENCOUNTER — Ambulatory Visit: Payer: Medicaid Other | Admitting: Pharmacist

## 2017-12-23 VITALS — BP 187/88 | HR 80 | Temp 98.1°F | Ht 65.0 in | Wt 173.0 lb

## 2017-12-23 DIAGNOSIS — F329 Major depressive disorder, single episode, unspecified: Secondary | ICD-10-CM

## 2017-12-23 DIAGNOSIS — R21 Rash and other nonspecific skin eruption: Secondary | ICD-10-CM | POA: Diagnosis not present

## 2017-12-23 DIAGNOSIS — E1169 Type 2 diabetes mellitus with other specified complication: Secondary | ICD-10-CM | POA: Diagnosis not present

## 2017-12-23 DIAGNOSIS — N183 Chronic kidney disease, stage 3 unspecified: Secondary | ICD-10-CM

## 2017-12-23 DIAGNOSIS — B20 Human immunodeficiency virus [HIV] disease: Secondary | ICD-10-CM | POA: Diagnosis not present

## 2017-12-23 DIAGNOSIS — E669 Obesity, unspecified: Secondary | ICD-10-CM | POA: Diagnosis not present

## 2017-12-23 DIAGNOSIS — Z794 Long term (current) use of insulin: Secondary | ICD-10-CM

## 2017-12-23 DIAGNOSIS — E1122 Type 2 diabetes mellitus with diabetic chronic kidney disease: Secondary | ICD-10-CM | POA: Diagnosis not present

## 2017-12-23 DIAGNOSIS — E875 Hyperkalemia: Secondary | ICD-10-CM | POA: Diagnosis not present

## 2017-12-23 DIAGNOSIS — G4700 Insomnia, unspecified: Secondary | ICD-10-CM | POA: Diagnosis not present

## 2017-12-23 DIAGNOSIS — Z79899 Other long term (current) drug therapy: Secondary | ICD-10-CM

## 2017-12-23 DIAGNOSIS — I129 Hypertensive chronic kidney disease with stage 1 through stage 4 chronic kidney disease, or unspecified chronic kidney disease: Secondary | ICD-10-CM

## 2017-12-23 DIAGNOSIS — M549 Dorsalgia, unspecified: Secondary | ICD-10-CM

## 2017-12-23 DIAGNOSIS — Z6828 Body mass index (BMI) 28.0-28.9, adult: Secondary | ICD-10-CM | POA: Diagnosis not present

## 2017-12-23 NOTE — Progress Notes (Signed)
CC: Follow up for diabetes and hypertension  HPI:  [name] is a 42 y.o. female with PMHx detailed below presenting for follow-up of her diabetes and hypertension since she has been able to follow-up with her primary care provider until June.  She is also been instructed to get a repeat metabolic panel checked for resolution of previous AKI in February.  See problem based assessment and plan below for additional details.  Diabetes mellitus type 2 in obese [facility]) Ms. Ms. metformin was stopped in February due to progression of chronic renal disease.  She is currently continuing on Lantus 15 units and the Victoza 0.6 mg daily.  Review of home glucometer reveals improved blood sugars mostly in the 100s.  She denies any recent symptomatic hypoglycemia. Improving diabetic control on her insulin may reflect worsening renal disease Plan: We will continue the current medications today   CKD (chronic kidney disease) stage 3, GFR 30-59 ml/min We will check repeat metabolic panel today since she had AKI versus worsening of chronic kidney disease earlier this year.  BMP shows mild hyperkalemia at 5.5 and creatinine of 3.92 (eGFR 16).  I do not see obvious offending medications on her list.  I will recommend the patient to have repeat labs before her scheduled PCP visit in over a month to ensure hyperkalemia is not progressing, if it is would recommend starting Kayexalate and referral back to nephrology  Human immunodeficiency virus (HIV) disease ([place]) She request medication refills since the current will run out before her upcoming appointment with Dr. Johnnye Frank. Plan: We will reorder the Tivicay 50 mg and disc OV 200-25 mg today.   -At patient's request I will go ahead and order the HIV 1 RNA quantitative and CD4 count to be drawn along with other labs today for review by her ID doctor.   Past Medical History:  Diagnosis Date  . CKD (chronic kidney disease) stage 3, GFR 30-59 ml/min  (HCC) 06/19/2016   06/30/16: Seen by Dr. Posey Frank [Nephrology]. Suspect etiologies to include diabetes and hypertension though cannot exclude HIV-associated nephropathy. Advised her against from using excessive Goody powder.  . CKD (chronic kidney disease), stage III ([place])   . Contact dermatitis 04/21/2016   07/29/16: Skin biopsy performed by [facility] notable for acute spongiotic dermatitis consistent with contact dermatitis.  . Cyclical vomiting    THC induced???  . Depression   . Depression 06/28/2006   Qualifier: Diagnosis of  By: Kathy Dubin MD, Kathy Frank    . Diabetes mellitus type 2 in obese ([place]) 06/28/1994  . DKA (diabetic ketoacidoses) ([place]) 12/2014  . Erosive esophagitis   . Esophageal reflux   . Essential hypertension 11/16/2013  . Gastroparesis    ? diabetic  . GERD (gastroesophageal reflux disease)   . Human immunodeficiency virus (HIV) disease ([place]) dx'd 04/2016  . Human immunodeficiency virus (HIV) disease ([place]) 04/23/2016  . Hypertension   . Moderate nonproliferative diabetic retinopathy of both eyes ([place]) 11/21/2014   11/14/14: Noted on retinal imaging; needs follow-up imaging in 6 months  05/22/16: Noted on retinal imaging again; needs follow-up imaging in 6 months  . Normocytic anemia   . TOBACCO USER 06/28/2006   Qualifier: Diagnosis of  By: Kathy Dubin MD, Kathy Frank    . Type II diabetes mellitus ([place]) 1994   diagnosed around 1994    Review of Systems: Review of Systems  Constitutional: Negative for chills and fever.  HENT: Negative for hearing loss.   Eyes: Negative for blurred vision.  Respiratory: Negative for shortness of breath.   Cardiovascular: Negative for leg swelling.  Gastrointestinal: Negative for abdominal pain.  Genitourinary: Negative for frequency.  Musculoskeletal: Positive for back pain.  Skin: Positive for rash.  Neurological: Negative for sensory change.  Endo/Heme/Allergies: Negative for polydipsia.  Psychiatric/Behavioral: Positive for  depression. The patient has insomnia.      Physical Exam: Vitals:   12/23/17 1500  BP: (!) 187/88  Pulse: 80  Temp: 98.1 F (36.7 C)  TempSrc: Oral  SpO2: 100%  Weight: 173 lb (78.5 kg)  Height: _0  (1.651 m)   GENERAL- alert, co-operative, NAD HEENT- Atraumatic, oral mucosa appears moist, no oropharyngeal erythema, no cervical lymphadenopathy CARDIAC- RRR, no murmurs, rubs or gallops. RESP- CTAB, no wheezes or crackles. ABDOMEN- Soft, nontender EXTREMITIES- symmetric, no pedal edema. SKIN-scattered small round skin lesions on shins and feet, few vesicular type on dorsum of right foot PSYCH-sad and flat affect, appropriate thought content and speech    Assessment & Plan:   See encounters tab for problem based medical decision making.   Patient discussed with Dr. Dareen Frank

## 2017-12-23 NOTE — Patient Instructions (Signed)
I am sorry to hear you are having so much difficulty over the past few months. You are doing a fantastic job managing your diabetes!  We will check your blood work today and make sure you are doing okay on the current blood pressure medicine and fluid pill.  I will go ahead and add your HIV and cell counts today so Dr. Johnnye Sima should already have these available by the time you see him.  We really should see you back in at your June PCP visit and check in again on your blood pressure and how things are going.

## 2017-12-24 ENCOUNTER — Other Ambulatory Visit: Payer: Self-pay | Admitting: *Deleted

## 2017-12-24 ENCOUNTER — Other Ambulatory Visit: Payer: Self-pay | Admitting: Internal Medicine

## 2017-12-24 DIAGNOSIS — E1169 Type 2 diabetes mellitus with other specified complication: Secondary | ICD-10-CM

## 2017-12-24 DIAGNOSIS — I10 Essential (primary) hypertension: Secondary | ICD-10-CM

## 2017-12-24 DIAGNOSIS — K5900 Constipation, unspecified: Secondary | ICD-10-CM

## 2017-12-24 DIAGNOSIS — F32A Depression, unspecified: Secondary | ICD-10-CM

## 2017-12-24 DIAGNOSIS — E669 Obesity, unspecified: Secondary | ICD-10-CM

## 2017-12-24 DIAGNOSIS — B20 Human immunodeficiency virus [HIV] disease: Secondary | ICD-10-CM

## 2017-12-24 DIAGNOSIS — A084 Viral intestinal infection, unspecified: Secondary | ICD-10-CM

## 2017-12-24 DIAGNOSIS — F329 Major depressive disorder, single episode, unspecified: Secondary | ICD-10-CM

## 2017-12-24 LAB — BMP8+ANION GAP
Anion Gap: 13 mmol/L (ref 10.0–18.0)
BUN/Creatinine Ratio: 7 — ABNORMAL LOW (ref 9–23)
BUN: 28 mg/dL — ABNORMAL HIGH (ref 6–24)
CO2: 14 mmol/L — ABNORMAL LOW (ref 20–29)
Calcium: 8.9 mg/dL (ref 8.7–10.2)
Chloride: 111 mmol/L — ABNORMAL HIGH (ref 96–106)
Creatinine, Ser: 3.92 mg/dL — ABNORMAL HIGH (ref 0.57–1.00)
GFR calc Af Amer: 16 mL/min/{1.73_m2} — ABNORMAL LOW (ref >59)
GFR calc non Af Amer: 13 mL/min/{1.73_m2} — ABNORMAL LOW (ref >59)
Glucose: 193 mg/dL — ABNORMAL HIGH (ref 65–99)
Potassium: 5.5 mmol/L — ABNORMAL HIGH (ref 3.5–5.2)
Sodium: 138 mmol/L (ref 134–144)

## 2017-12-24 LAB — T-HELPER CELLS (CD4) COUNT (NOT AT ARMC)
CD4 % Helper T Cell: 28 % — ABNORMAL LOW (ref 33–55)
CD4 T Cell Abs: 450 /uL (ref 400–2700)

## 2017-12-24 LAB — CBC WITH DIFFERENTIAL/PLATELET
Basophils Absolute: 0.1 10*3/uL (ref 0.0–0.2)
Basos: 1 %
EOS (ABSOLUTE): 0.2 10*3/uL (ref 0.0–0.4)
Eos: 4 %
Hematocrit: 31.4 % — ABNORMAL LOW (ref 34.0–46.6)
Hemoglobin: 10.1 g/dL — ABNORMAL LOW (ref 11.1–15.9)
Immature Grans (Abs): 0 10*3/uL (ref 0.0–0.1)
Immature Granulocytes: 0 %
Lymphocytes Absolute: 1.6 10*3/uL (ref 0.7–3.1)
Lymphs: 27 %
MCH: 31.7 pg (ref 26.6–33.0)
MCHC: 32.2 g/dL (ref 31.5–35.7)
MCV: 98 fL — ABNORMAL HIGH (ref 79–97)
Monocytes Absolute: 0.4 10*3/uL (ref 0.1–0.9)
Monocytes: 7 %
Neutrophils Absolute: 3.5 10*3/uL (ref 1.4–7.0)
Neutrophils: 61 %
Platelets: 386 10*3/uL — ABNORMAL HIGH (ref 150–379)
RBC: 3.19 x10E6/uL — ABNORMAL LOW (ref 3.77–5.28)
RDW: 14.2 % (ref 12.3–15.4)
WBC: 5.8 10*3/uL (ref 3.4–10.8)

## 2017-12-24 MED ORDER — EMTRICITABINE-TENOFOVIR AF 200-25 MG PO TABS: ORAL_TABLET | ORAL | 0 refills | Status: DC

## 2017-12-24 MED ORDER — LIRAGLUTIDE 18 MG/3ML ~~LOC~~ SOPN: 0.6000 mg | PEN_INJECTOR | Freq: Every day | SUBCUTANEOUS | 1 refills | Status: DC

## 2017-12-24 MED ORDER — DOLUTEGRAVIR SODIUM 50 MG PO TABS: ORAL_TABLET | ORAL | 0 refills | Status: DC

## 2017-12-24 MED ORDER — SENNA-DOCUSATE SODIUM 8.6-50 MG PO TABS: 1.0000 | ORAL_TABLET | Freq: Every day | ORAL | 0 refills | Status: DC | PRN

## 2017-12-24 MED ORDER — CITALOPRAM HYDROBROMIDE 20 MG PO TABS: 20.0000 mg | ORAL_TABLET | Freq: Every day | ORAL | 2 refills | Status: DC

## 2017-12-24 MED ORDER — SODIUM BICARBONATE 650 MG PO TABS: 650.0000 mg | ORAL_TABLET | Freq: Two times a day (BID) | ORAL | 6 refills | Status: DC

## 2017-12-24 MED ORDER — METOCLOPRAMIDE HCL 5 MG PO TABS: 5.0000 mg | ORAL_TABLET | Freq: Three times a day (TID) | ORAL | 0 refills | Status: DC

## 2017-12-24 MED ORDER — ADULT MULTIVITAMIN W/MINERALS CH: 1.0000 | ORAL_TABLET | Freq: Every day | ORAL | 0 refills | Status: DC

## 2017-12-24 MED ORDER — PANTOPRAZOLE SODIUM 40 MG PO TBEC: 40.0000 mg | DELAYED_RELEASE_TABLET | Freq: Every day | ORAL | 1 refills | Status: DC

## 2017-12-24 MED ORDER — DICLOFENAC SODIUM 1 % TD GEL: 2.0000 g | Freq: Four times a day (QID) | TRANSDERMAL | 0 refills | Status: DC

## 2017-12-24 MED ORDER — FUROSEMIDE 40 MG PO TABS: 40.0000 mg | ORAL_TABLET | Freq: Every day | ORAL | 2 refills | Status: DC

## 2017-12-24 MED ORDER — AMLODIPINE BESYLATE 10 MG PO TABS: 10.0000 mg | ORAL_TABLET | Freq: Every day | ORAL | 2 refills | Status: DC

## 2017-12-24 MED ORDER — INSULIN GLARGINE 100 UNIT/ML SOLOSTAR PEN: 15.0000 [IU] | PEN_INJECTOR | Freq: Every day | SUBCUTANEOUS | Status: DC

## 2017-12-24 MED FILL — AMLODIPINE BESYLATE 10 MG T: 10 | 30 days supply | Qty: 30 | Fill #0

## 2017-12-24 MED FILL — FUROSEMIDE 40 MG TAB: 40 | 30 days supply | Qty: 30 | Fill #0

## 2017-12-24 MED FILL — DESCOVY 200-25 MG TABS: 200-25 | 30 days supply | Qty: 30 | Fill #0

## 2017-12-24 MED FILL — TIVICAY 50 MG TABLET: 50 | 30 days supply | Qty: 30 | Fill #0

## 2017-12-24 NOTE — Addendum Note (Signed)
Addended by: Collier Salina on: 12/24/2017 05:12 PM   Modules accepted: Orders

## 2017-12-24 NOTE — Telephone Encounter (Signed)
Patient is requesting a refill on medicine, sent to the outpatient pharmacy

## 2017-12-24 NOTE — Telephone Encounter (Signed)
Sent in new encounter 

## 2017-12-24 NOTE — Telephone Encounter (Signed)
Patient is calling back, pls call back. Patient needs her medicine today

## 2017-12-24 NOTE — Telephone Encounter (Signed)
Medication orders placed. Note her multivitamin is not configured for e-prescribing to pharmacy.

## 2017-12-24 NOTE — Telephone Encounter (Signed)
Patient notified that refills sent. Hubbard Hartshorn, RN, BSN

## 2017-12-24 NOTE — Telephone Encounter (Addendum)
Noted that lantus Rx no print option. Called med to [facility] at [place].   Patient at Pharmacy now. Requesting refill on anti-virals. Spoke with Dr. Benjamine Mola who will send refills for these now. Patient made aware. Hubbard Hartshorn, RN, BSN

## 2017-12-25 LAB — HIV-1 RNA QUANT-NO REFLEX-BLD
HIV-1 RNA Viral Load Log: 1.903 log10copy/mL
HIV-1 RNA Viral Load: 80 copies/mL

## 2017-12-25 NOTE — Progress Notes (Signed)
Patient was seen today in a co-visit between the physician and pharmacist.  During this visit, medications were discussed with patient. Assisted patient with medication access/refills.   See documentation under Dr. Marveen Reeks visit for details.

## 2017-12-27 NOTE — Assessment & Plan Note (Signed)
She request medication refills since the current will run out before her upcoming appointment with Dr. Johnnye Sima. Plan: We will reorder the Tivicay 50 mg and disc OV 200-25 mg today.   -At patient's request I will go ahead and order the HIV 1 RNA quantitative and CD4 count to be drawn along with other labs today for review by her ID doctor.

## 2017-12-27 NOTE — Assessment & Plan Note (Addendum)
We will check repeat metabolic panel today since she had AKI versus worsening of chronic kidney disease earlier this year.  BMP shows mild hyperkalemia at 5.5 and creatinine of 3.92 (eGFR 16).  I do not see obvious offending medications on her list.  I will recommend the patient to have repeat labs before her scheduled PCP visit in over a month to ensure hyperkalemia is not progressing, if it is would recommend starting Kayexalate and referral back to nephrology

## 2017-12-27 NOTE — Progress Notes (Signed)
Internal Medicine Clinic Attending  Case discussed with Dr. Rice at the time of the visit.  We reviewed the resident's history and exam and pertinent patient test results.  I agree with the assessment, diagnosis, and plan of care documented in the resident's note.  

## 2017-12-27 NOTE — Assessment & Plan Note (Addendum)
Ms. Ms. metformin was stopped in February due to progression of chronic renal disease.  She is currently continuing on Lantus 15 units and the Victoza 0.6 mg daily.  Review of home glucometer reveals improved blood sugars mostly in the 100s.  She denies any recent symptomatic hypoglycemia. Improving diabetic control on her insulin may reflect worsening renal disease Plan: We will continue the current medications today

## 2018-01-05 ENCOUNTER — Ambulatory Visit (INDEPENDENT_AMBULATORY_CARE_PROVIDER_SITE_OTHER): Payer: Medicaid Other | Admitting: Infectious Diseases

## 2018-01-05 ENCOUNTER — Encounter: Payer: Self-pay | Admitting: Infectious Diseases

## 2018-01-05 VITALS — BP 183/94 | HR 87 | Temp 98.4°F | Wt 166.0 lb

## 2018-01-05 DIAGNOSIS — L98499 Non-pressure chronic ulcer of skin of other sites with unspecified severity: Secondary | ICD-10-CM

## 2018-01-05 DIAGNOSIS — H00015 Hordeolum externum left lower eyelid: Secondary | ICD-10-CM

## 2018-01-05 DIAGNOSIS — H00019 Hordeolum externum unspecified eye, unspecified eyelid: Secondary | ICD-10-CM | POA: Insufficient documentation

## 2018-01-05 DIAGNOSIS — F17200 Nicotine dependence, unspecified, uncomplicated: Secondary | ICD-10-CM | POA: Diagnosis not present

## 2018-01-05 DIAGNOSIS — N183 Chronic kidney disease, stage 3 unspecified: Secondary | ICD-10-CM

## 2018-01-05 DIAGNOSIS — Z23 Encounter for immunization: Secondary | ICD-10-CM

## 2018-01-05 DIAGNOSIS — Z113 Encounter for screening for infections with a predominantly sexual mode of transmission: Secondary | ICD-10-CM | POA: Diagnosis not present

## 2018-01-05 DIAGNOSIS — Z79899 Other long term (current) drug therapy: Secondary | ICD-10-CM

## 2018-01-05 DIAGNOSIS — I10 Essential (primary) hypertension: Secondary | ICD-10-CM

## 2018-01-05 DIAGNOSIS — B20 Human immunodeficiency virus [HIV] disease: Secondary | ICD-10-CM | POA: Diagnosis not present

## 2018-01-05 MED ORDER — DOXYCYCLINE HYCLATE 100 MG PO TABS
100.0000 mg | ORAL_TABLET | Freq: Two times a day (BID) | ORAL | 0 refills | Status: DC
Start: 2018-01-05 — End: 2018-04-22

## 2018-01-05 MED ORDER — PNEUMOCOCCAL 13-VAL CONJ VACC IM SUSP
0.5000 mL | INTRAMUSCULAR | Status: AC
  Administered 2018-01-05: 0.5 mL via INTRAMUSCULAR

## 2018-01-05 NOTE — Assessment & Plan Note (Signed)
Quite elevated today but asx.  Will have her f/u at IM

## 2018-01-05 NOTE — Assessment & Plan Note (Signed)
Will get her back in with Derm

## 2018-01-05 NOTE — Addendum Note (Signed)
Addended by: Aundria Rud on: 01/05/2018 04:56 PM   Modules accepted: Orders

## 2018-01-05 NOTE — Assessment & Plan Note (Signed)
Has quit.  Smoked 42 yo to 42 yo.

## 2018-01-05 NOTE — Assessment & Plan Note (Addendum)
Will give her rx for doxy Will have her seen by ophtho I asked her call me immediately if this worsens.

## 2018-01-05 NOTE — Progress Notes (Signed)
   Subjective:    Patient ID: Kathy Frank, female    DOB: Nov 24, 1975, 42 y.o.   MRN: [id number]  HPI 42 yo F with hx of HIV+ since 2017. She has been on genvoya.  She was also dx with Dm2 and had a DM foot infection.  Her course has been further complicated by nephropathy/CKD3.  She has been on descovey-tivicay.  She was in hospital in Feb fo RSV and AKI on CKD.   Believes her FSG have been ok, not high. Was in the 300s a couple of weeks ago.   Has a stye under her L eye for last 4 days. No change in vision.  Denies missed BP rx.  Has "been depressed all the time... A lot of crying". "..myalgias situation, I can't plan a future with nobody".   HIV 1 RNA Quant (copies/mL)  Date Value  09/13/2017 <20  10/07/2016 <20 NOT DETECTED  05/27/2016 25 (H)   HIV-1 RNA Viral Load (copies/mL)  Date Value  12/23/2017 80  02/02/2017 80   CD4 T Cell Abs (/uL)  Date Value  12/23/2017 450  09/13/2017 440  02/02/2017 390 (L)    Review of Systems  Constitutional: Positive for appetite change. Negative for unexpected weight change.  Respiratory: Negative for shortness of breath.   Cardiovascular: Negative for chest pain.  Gastrointestinal: Negative for constipation and diarrhea.  Genitourinary: Negative for difficulty urinating.  Skin: Positive for wound.  Neurological: Positive for headaches.  Psychiatric/Behavioral: Positive for dysphoric mood.  Please see HPI. All other systems reviewed and negative.      Objective:   Physical Exam  Constitutional: She is oriented to person, place, and time. She appears well-developed and well-nourished.  HENT:  Mouth/Throat: No oropharyngeal exudate.  Eyes: Pupils are equal, round, and reactive to light. EOM are normal.  Neck: Normal range of motion. Neck supple.  Cardiovascular: Normal rate, regular rhythm and normal heart sounds.  Pulmonary/Chest: Effort normal and breath sounds normal.  Abdominal: Soft. Bowel sounds are normal. There is no  tenderness. There is no guarding.  Musculoskeletal: Normal range of motion.  Neurological: She is alert and oriented to person, place, and time.  Skin: Skin is warm and dry.          Assessment & Plan:

## 2018-01-05 NOTE — Assessment & Plan Note (Signed)
We discussed her CD4 and HIV RNA.  She is doing well She has pap scheduled.  Offered/refused condoms.  Will check her Hep S Ab to see if she responded.  Will give her prevnar.  rtc in 9 months

## 2018-01-05 NOTE — Assessment & Plan Note (Signed)
Lab Results  Component Value Date   CREATININE 3.92 (H) 12/23/2017   CREATININE 3.19 (H) 09/14/2017   CREATININE 3.89 (H) 09/13/2017   Mild worsening.  Needs f/u with Dr Posey Pronto

## 2018-01-06 ENCOUNTER — Telehealth: Payer: Self-pay

## 2018-01-06 NOTE — Telephone Encounter (Signed)
Per Pt's request called [facility] to get pt an appt with Dr. Posey Pronto. Spoke with Jeani Hawking who stated they would need recent office note and labs to determine how soon pt would need to be seen. Faxed office note to [phone]. Will call pt to inform her of this. [place]

## 2018-01-07 NOTE — Addendum Note (Signed)
Addended by: Orson Gear on: 01/07/2018 09:57 AM   Modules accepted: Orders

## 2018-01-19 ENCOUNTER — Telehealth: Payer: Self-pay | Admitting: Internal Medicine

## 2018-01-19 NOTE — Telephone Encounter (Signed)
Pt requesting a Referral back to the Dermatologist she has seen before at the [facility].  Patient states she would rather our office do this referral than her ID Dr.  Johnnye Sima.  Please advise if a referral can be placed.

## 2018-01-20 NOTE — Telephone Encounter (Signed)
It has been a year since the patient was seen by provider regarding skin ulcer. Please schedule an appt with me or acc to determine if derm referral is appropriate. Thanks!

## 2018-02-02 NOTE — Progress Notes (Signed)
CC: Diabetes Follow up  HPI:  [name] is a 42 y.o. person living with HIV, essential hypertension, diabetes mellitus type 2, CKD stage 3 who presents for diabetes follow up. Please see problem based charting for evaluation, assessment, and plan.  Past Medical History:  Diagnosis Date  . CKD (chronic kidney disease) stage 3, GFR 30-59 ml/min (HCC) 06/19/2016   06/30/16: Seen by Dr. Posey Pronto [Nephrology]. Suspect etiologies to include diabetes and hypertension though cannot exclude HIV-associated nephropathy. Advised her against from using excessive Goody powder.  . CKD (chronic kidney disease), stage III ([place])   . Contact dermatitis 04/21/2016   07/29/16: Skin biopsy performed by [facility] notable for acute spongiotic dermatitis consistent with contact dermatitis.  . Cyclical vomiting    THC induced???  . Depression   . Depression 06/28/2006   Qualifier: Diagnosis of  By: Riccardo Dubin MD, Sherren Mocha    . Diabetes mellitus type 2 in obese ([place]) 06/28/1994  . DKA (diabetic ketoacidoses) ([place]) 12/2014  . Erosive esophagitis   . Esophageal reflux   . Essential hypertension 11/16/2013  . Gastroparesis    ? diabetic  . GERD (gastroesophageal reflux disease)   . Human immunodeficiency virus (HIV) disease ([place]) dx'd 04/2016  . Human immunodeficiency virus (HIV) disease ([place]) 04/23/2016  . Hypertension   . Moderate nonproliferative diabetic retinopathy of both eyes ([place]) 11/21/2014   11/14/14: Noted on retinal imaging; needs follow-up imaging in 6 months  05/22/16: Noted on retinal imaging again; needs follow-up imaging in 6 months  . Normocytic anemia   . TOBACCO USER 06/28/2006   Qualifier: Diagnosis of  By: Riccardo Dubin MD, Sherren Mocha    . Type II diabetes mellitus ([place]) 1994   diagnosed around 1994   Review of Systems:    Denies back pain, fevers, numbness, chest pain, sob, vaginal discharge Has abdominal pain  Physical Exam:  Vitals:   02/04/18 1551  BP: (!) 160/84  Pulse: 96   Temp: 99.2 F (37.3 C)  TempSrc: Oral  SpO2: 100%  Weight: 164 lb 6.4 oz (74.6 kg)  Height: 5\' 5"  (1.651 m)   Physical Exam  Constitutional: She appears well-developed and well-nourished. No distress.  HENT:  Head: Normocephalic and atraumatic.  Eyes: Conjunctivae are normal.  Cardiovascular: Normal rate, regular rhythm and normal heart sounds.  Respiratory: Effort normal and breath sounds normal. No respiratory distress. She has no wheezes.  GI: Soft. Bowel sounds are normal. She exhibits no distension. There is no tenderness.  Genitourinary: Rectum normal and uterus normal. Rectal exam shows no mass, no tenderness and anal tone normal. Vaginal discharge (mild amount of white) found.  Genitourinary Comments: Small 1-2cm solid blister noted to the left posterior aspect of anal canal  Musculoskeletal: She exhibits no edema.  Neurological: She is alert.  Skin: Rash noted. She is not diaphoretic.  Psychiatric: She has a normal mood and affect. Her behavior is normal. Judgment and thought content normal.     Assessment & Plan:   See Encounters Tab for problem based charting.  Diabetes Mellitus Type 2 The patient is taking victoza 0.6mg  qd and lantus 15u qhs. The patient's last a1c=9.0 in February 2019 and during this visit is 7.9. She is compliant with medication. The patient has lost 7lbs since last visit 2 weeks ago.  Assessment and plan The patient's blood glucose control has improved, but can still achieve better control. Will increase victoza to usual 1.2mg  qd dose and will encourage the patient to continue using lantus  15u qhs. The patient cannot be on metformin due to concomitant renal failure.   Diabetic gastroparesis The patient states that she has been having nausea and abdominal discomfort. She states that she has been losing weight recently due to her abdominal discomfort. There has been concern for diabetic gastroparesis previously, but a gastric emptying study has not  been done.   Assessment and plan -ordered nm gastric emptying study-the patient does not have egg allergy  HIV The patient is currently taking descovy and tivicay. Her viral load is 80 and abs cd4 count is 450.  Essential Hypertension The patient's blood pressure during this visit was 160/84. The patient is currently taking amlodipine 10mg  qd and lasix 40mg  qd. The patient's blood pressure continues to be elevated.   Assessment and plan  The patient's blood pressure continues to be elevated ranging 160-180s/80-90s at several past medical visits. She is euvolemic on exam. Will avoid acei and arb at this time due to concomittant renal failure. Will start the patient on labetalol 100mg  bid.   -Started patient on labetalol 100mg  bid to take along with lasix 40mg  qd and amlodipine 10mg  qd -Follow up in 1 month  Skin Ulcer The patient states that she has been having a hyperpigmented raised lesion present on bilateral lower extremities that is especially present during the summer months. There also areas of superficial ulceration that is without any crusting or discharge. However, the patient states that she has occasional brownish colored discharge that comes out of the wound.   The patient was previously prescribed prednisone for 1 week due to concern that the ulcer was pyoderma gangrenosum. She was also prescribed antibiotics. Previous skin biopsy showed subacute spongiotic dermatitis eosinophils. The patient's lesions went away temporarily and then returned.  Assessment Prior testing for possible vasculitis returned negative. Unlikely opportunitistic infection as patient's viral load is well controlled. The patient's previous skin biopsy returned as subacute spongiotic dermatitis. Will treat dermatitis with medium intensity steroid of kenalog 0.1% bid. Will also refer the patient to dermatology.  Fecal incontinence The patient reports a 4 week history of fecal incontinence during both daytime  and nighttime. The patient states that she notes waking up in the night with her panties soiled. The most amount of stool that she has seen is about fist size in amount. The patient does not note any numbness around the anus. She does not have any pain upon defecation, back pain, fevers, numbness, blood in stool. She is able to hold her bowel movements when she needs to. The patient does not know of any family members who have colon problems.   Assessment and plan Since the patient is able to control her bowel movements, has good anal tone, and does not have any numbness will not doe lumbar imaging at this time. It is possible that the patient has malabsorption problems that might be contributing to incontinenence.   -Ordered tsh, ferritin, b12 to workup for malabsorption -Gastroenterology referral done  Healthcare maintenance Pap smear done during visit- the patient is sexually active with one partner. They use condoms. She denies any vaginal discharge or irritation. Last menstrual period was 12/18/17  Patient discussed with Dr. Lynnae January

## 2018-02-04 ENCOUNTER — Other Ambulatory Visit: Payer: Self-pay

## 2018-02-04 ENCOUNTER — Encounter: Payer: Self-pay | Admitting: Internal Medicine

## 2018-02-04 ENCOUNTER — Ambulatory Visit: Payer: Medicaid Other | Admitting: Internal Medicine

## 2018-02-04 ENCOUNTER — Other Ambulatory Visit (HOSPITAL_COMMUNITY)
Admission: RE | Admit: 2018-02-04 | Discharge: 2018-02-04 | Disposition: A | Discharge status: Home / Self Care | Payer: Medicaid Other | Source: Ambulatory Visit | Attending: Internal Medicine | Admitting: Internal Medicine

## 2018-02-04 VITALS — BP 160/84 | HR 96 | Temp 99.2°F | Ht 65.0 in | Wt 164.4 lb

## 2018-02-04 DIAGNOSIS — E1122 Type 2 diabetes mellitus with diabetic chronic kidney disease: Secondary | ICD-10-CM

## 2018-02-04 DIAGNOSIS — Z6827 Body mass index (BMI) 27.0-27.9, adult: Secondary | ICD-10-CM

## 2018-02-04 DIAGNOSIS — N898 Other specified noninflammatory disorders of vagina: Secondary | ICD-10-CM

## 2018-02-04 DIAGNOSIS — N183 Chronic kidney disease, stage 3 unspecified: Secondary | ICD-10-CM

## 2018-02-04 DIAGNOSIS — Z794 Long term (current) use of insulin: Secondary | ICD-10-CM

## 2018-02-04 DIAGNOSIS — E1169 Type 2 diabetes mellitus with other specified complication: Secondary | ICD-10-CM | POA: Diagnosis not present

## 2018-02-04 DIAGNOSIS — L98499 Non-pressure chronic ulcer of skin of other sites with unspecified severity: Secondary | ICD-10-CM

## 2018-02-04 DIAGNOSIS — Z0000 Encounter for general adult medical examination without abnormal findings: Secondary | ICD-10-CM

## 2018-02-04 DIAGNOSIS — B20 Human immunodeficiency virus [HIV] disease: Secondary | ICD-10-CM

## 2018-02-04 DIAGNOSIS — L308 Other specified dermatitis: Secondary | ICD-10-CM | POA: Diagnosis not present

## 2018-02-04 DIAGNOSIS — E669 Obesity, unspecified: Secondary | ICD-10-CM | POA: Diagnosis not present

## 2018-02-04 DIAGNOSIS — Z79899 Other long term (current) drug therapy: Secondary | ICD-10-CM | POA: Diagnosis not present

## 2018-02-04 DIAGNOSIS — R159 Full incontinence of feces: Secondary | ICD-10-CM | POA: Diagnosis not present

## 2018-02-04 DIAGNOSIS — E1143 Type 2 diabetes mellitus with diabetic autonomic (poly)neuropathy: Secondary | ICD-10-CM | POA: Diagnosis not present

## 2018-02-04 DIAGNOSIS — I129 Hypertensive chronic kidney disease with stage 1 through stage 4 chronic kidney disease, or unspecified chronic kidney disease: Secondary | ICD-10-CM | POA: Diagnosis not present

## 2018-02-04 DIAGNOSIS — I10 Essential (primary) hypertension: Secondary | ICD-10-CM

## 2018-02-04 DIAGNOSIS — K3184 Gastroparesis: Secondary | ICD-10-CM | POA: Diagnosis not present

## 2018-02-04 HISTORY — DX: Full incontinence of feces: R15.9

## 2018-02-04 LAB — POCT GLYCOSYLATED HEMOGLOBIN (HGB A1C): Hemoglobin A1C: 7.9 % — AB (ref 4.0–5.6)

## 2018-02-04 LAB — GLUCOSE, CAPILLARY: Glucose-Capillary: 218 mg/dL — ABNORMAL HIGH (ref 70–99)

## 2018-02-04 MED ORDER — TRIAMCINOLONE ACETONIDE 0.1 % EX CREA: 1.0000 "application " | TOPICAL_CREAM | Freq: Two times a day (BID) | CUTANEOUS | 0 refills | Status: DC

## 2018-02-04 MED ORDER — LIRAGLUTIDE 18 MG/3ML ~~LOC~~ SOPN: 1.2000 mg | PEN_INJECTOR | Freq: Every day | SUBCUTANEOUS | 1 refills | Status: DC

## 2018-02-04 MED ORDER — LABETALOL HCL 100 MG PO TABS: 100.0000 mg | ORAL_TABLET | Freq: Two times a day (BID) | ORAL | 0 refills | Status: DC

## 2018-02-04 NOTE — Assessment & Plan Note (Signed)
The patient states that she has been having a hyperpigmented raised lesion present on bilateral lower extremities that is especially present during the summer months. There also areas of superficial ulceration that is without any crusting or discharge. However, the patient states that she has occasional brownish colored discharge that comes out of the wound.   The patient was previously prescribed prednisone for 1 week due to concern that the ulcer was pyoderma gangrenosum. She was also prescribed antibiotics. Previous skin biopsy showed subacute spongiotic dermatitis eosinophils. The patient's lesions went away temporarily and then returned.  Assessment Prior testing for possible vasculitis returned negative. Unlikely opportunitistic infection as patient's viral load is well controlled. The patient's previous skin biopsy returned as subacute spongiotic dermatitis. Will treat dermatitis with medium intensity steroid of kenalog 0.1% bid. Will also refer the patient to dermatology.

## 2018-02-04 NOTE — Patient Instructions (Addendum)
It was a pleasure to see you today Ms. Latella. Please make the following changes:  -Please get gastric emptying study done -You have been referred to dermatology -Please increase your victoza to 1.2mg  daily -Please use triamcinolone cream on skin lesions -Please follow up with gastroenterology  If you have any questions or concerns, please call our clinic at [phone] between 9am-5pm and after hours call [phone] and ask for the internal medicine resident on call. If you feel you are having a medical emergency please call 911.   Thank you, we look forward to help you remain healthy!  Lars Mage, MD Internal Medicine PGY1

## 2018-02-04 NOTE — Assessment & Plan Note (Signed)
The patient reports a 4 week history of fecal incontinence during both daytime and nighttime. The patient states that she notes waking up in the night with her panties soiled. The most amount of stool that she has seen is about fist size in amount. The patient does not note any numbness around the anus. She does not have any pain upon defecation, back pain, fevers, numbness, blood in stool. She is able to hold her bowel movements when she needs to. The patient does not know of any family members who have colon problems.   Assessment and plan Since the patient is able to control her bowel movements, has good anal tone, and does not have any numbness will not doe lumbar imaging at this time. It is possible that the patient has malabsorption problems that might be contributing to incontinenence.   -Ordered tsh, ferritin, b12 to workup for malabsorption -Gastroenterology referral done

## 2018-02-04 NOTE — Assessment & Plan Note (Signed)
The patient is taking victoza 0.6mg  qd and lantus 15u qhs. The patient's last a1c=9.0 in February 2019 and during this visit is 7.9. She is compliant with medication. The patient has lost 7lbs since last visit 2 weeks ago.  Assessment and plan The patient's blood glucose control has improved, but can still achieve better control. Will increase victoza to usual 1.2mg  qd dose and will encourage the patient to continue using lantus 15u qhs. The patient cannot be on metformin due to concomitant renal failure.

## 2018-02-04 NOTE — Assessment & Plan Note (Addendum)
Pap smear done during visit-the patient is sexually active with one partner. They use condoms. She denies any vaginal discharge or irritation. Last menstrual period was 12/18/17

## 2018-02-04 NOTE — Assessment & Plan Note (Signed)
The patient's blood pressure during this visit was 160/84. The patient is currently taking amlodipine 10mg  qd and lasix 40mg  qd. The patient's blood pressure continues to be elevated.   Assessment and plan  The patient's blood pressure continues to be elevated ranging 160-180s/80-90s at several past medical visits. She is euvolemic on exam. Will avoid acei and arb at this time due to concomittant renal failure. Will start the patient on labetalol 100mg  bid.   -Started patient on labetalol 100mg  bid to take along with lasix 40mg  qd and amlodipine 10mg  qd -Follow up in 1 month

## 2018-02-04 NOTE — Assessment & Plan Note (Signed)
The patient is currently taking descovy and tivicay. Her viral load is 80 and abs cd4 count is 450.

## 2018-02-04 NOTE — Assessment & Plan Note (Signed)
The patient states that she has been having nausea and abdominal discomfort. She states that she has been losing weight recently due to her abdominal discomfort. There has been concern for diabetic gastroparesis previously, but a gastric emptying study has not been done.   Assessment and plan -ordered nm gastric emptying study-the patient does not have egg allergy

## 2018-02-05 LAB — IRON: Iron: 68 ug/dL (ref 27–159)

## 2018-02-05 LAB — FERRITIN: Ferritin: 27 ng/mL (ref 15–150)

## 2018-02-05 LAB — VITAMIN B12: Vitamin B-12: 387 pg/mL (ref 232–1245)

## 2018-02-05 LAB — TSH: TSH: 1.01 u[IU]/mL (ref 0.450–4.500)

## 2018-02-07 ENCOUNTER — Encounter: Payer: Self-pay | Admitting: Internal Medicine

## 2018-02-08 ENCOUNTER — Telehealth: Payer: Self-pay | Admitting: *Deleted

## 2018-02-08 LAB — CYTOLOGY - PAP
Diagnosis: NEGATIVE
HPV: DETECTED — AB

## 2018-02-08 NOTE — Progress Notes (Signed)
Internal Medicine Clinic Attending  I saw and evaluated the patient.  I personally confirmed the key portions of the history and exam documented by Dr. Chundi and I reviewed pertinent patient test results.  The assessment, diagnosis, and plan were formulated together and I agree with the documentation in the resident's note. 

## 2018-02-08 NOTE — Telephone Encounter (Signed)
Call from pt - asking if she needs to be on "all 3 medications " for her BP. Informed pt to continue Lasix and Amlodipine and Labetalol was added and f/u ina month. Per last office visit ,"Started patient on labetalol 100mg  bid to take along with lasix 40mg  qd and amlodipine 10mg  qd-Follow up in 1 month". Pt voiced understanding.

## 2018-02-11 NOTE — Telephone Encounter (Signed)
Thank you. She should also measure her blood pressure at home and bring the readings into her next office visit.

## 2018-02-22 ENCOUNTER — Encounter (HOSPITAL_COMMUNITY): Admission: RE | Admit: 2018-02-22 | Payer: Medicaid Other | Source: Ambulatory Visit

## 2018-02-23 ENCOUNTER — Telehealth: Payer: Self-pay | Admitting: Internal Medicine

## 2018-02-23 NOTE — Telephone Encounter (Signed)
Prior Authorization, pls contact CoverMyMeds at [phone]

## 2018-02-28 ENCOUNTER — Other Ambulatory Visit: Payer: Self-pay | Admitting: Infectious Diseases

## 2018-02-28 DIAGNOSIS — B20 Human immunodeficiency virus [HIV] disease: Secondary | ICD-10-CM

## 2018-03-02 ENCOUNTER — Telehealth: Payer: Self-pay | Admitting: *Deleted

## 2018-03-02 NOTE — Telephone Encounter (Signed)
Kathy Frank is calling back today

## 2018-03-02 NOTE — Telephone Encounter (Addendum)
Information was sent to [employer] for PA for Victoza.  Awaiting determination.  Sander Nephew, RN 03/02/2018 3:24 PM.  PA for Victoza was approved 03/02/2018 thru 03/02/2019. Sander Nephew, RN 03/02/2018 4:23 PM.

## 2018-03-02 NOTE — Telephone Encounter (Signed)
This has been completed per gladys

## 2018-03-22 NOTE — Addendum Note (Signed)
Addended by: Hulan Fray on: 03/22/2018 06:15 PM   Modules accepted: Orders

## 2018-04-12 ENCOUNTER — Ambulatory Visit: Payer: Medicaid Other | Admitting: Internal Medicine

## 2018-04-12 NOTE — Addendum Note (Signed)
Addended by: Hulan Fray on: 04/12/2018 07:05 PM   Modules accepted: Orders

## 2018-04-15 ENCOUNTER — Encounter: Payer: Medicaid Other | Admitting: Internal Medicine

## 2018-04-22 ENCOUNTER — Encounter: Payer: Medicaid Other | Admitting: Internal Medicine

## 2018-04-22 ENCOUNTER — Encounter: Payer: Self-pay | Admitting: Internal Medicine

## 2018-04-22 ENCOUNTER — Ambulatory Visit: Payer: Medicaid Other | Admitting: Dietician

## 2018-04-22 NOTE — Progress Notes (Deleted)
   CC: ***  HPI:  [name] is a 42 y.o. female living with hiv, essential hypertension, diabetes mellitus type 2 with diabetic foot infection and nephropathy, CKD III secondary to diabetes who presents for diabetes follow up.Please see problem based charting for evaluation, assessment, and plan.  Diabetes Mellitus The patient is taking liraglutide 1.2mg  qd and lantus 15u,. The patient's last a1c=7.9 on 02/04/18. The patient's home blood glucose measurements over the past month have ranged ***. The patient does/does not note episodes of hypoglycemia.  He/yes*** or she is compliant with medication.    Patient's weight changes ***  Hypertension The patient's blood pressure during this visit is ***. She is currently on amlodipine 10mg  qd, lasix 40mg  qd.  HIV  The patient is currently on descovy and tivicay daily. Her CD4 count was 450 and viral load of 80 in May 2019. The patient last saw infectious disease specialist Dr. Johnnye Sima in May 2019 during which time she was told that she is doing well and to return in 9 months. Prevnar was given at that visit. She has received hep b vaccines on 05/27/16 and 07/08/16 she was supposed to get Hep b surface ab checked at prior visit, but looks like was not done. Will check at this visit.  Plan Checked Hep B s Ab  CKD III The patient's kidney function was last checked in May 2019 during which time her creatinine was 3.92 with gfr of 16.   Plan  The patient states that she is continuing to make urine and has not noticed any hematuria ***.  Major depressive disorder  Patient's PHQ9 at this visit is ***. She is currently on celexa 20mg  qd.   Health Maintenance Urine Microalbumin Influenza vaccine      Past Medical History:  Diagnosis Date  . CKD (chronic kidney disease) stage 3, GFR 30-59 ml/min (HCC) 06/19/2016   06/30/16: Seen by Dr. Posey Pronto [Nephrology]. Suspect etiologies to include diabetes and hypertension though cannot exclude  HIV-associated nephropathy. Advised her against from using excessive Goody powder.  . CKD (chronic kidney disease), stage III ([place])   . Contact dermatitis 04/21/2016   07/29/16: Skin biopsy performed by [facility] notable for acute spongiotic dermatitis consistent with contact dermatitis.  . Cyclical vomiting    THC induced???  . Depression   . Depression 06/28/2006   Qualifier: Diagnosis of  By: Riccardo Dubin MD, Sherren Mocha    . Diabetes mellitus type 2 in obese ([place]) 06/28/1994  . DKA (diabetic ketoacidoses) ([place]) 12/2014  . Erosive esophagitis   . Esophageal reflux   . Essential hypertension 11/16/2013  . Gastroparesis    ? diabetic  . GERD (gastroesophageal reflux disease)   . Human immunodeficiency virus (HIV) disease ([place]) dx'd 04/2016  . Human immunodeficiency virus (HIV) disease ([place]) 04/23/2016  . Hypertension   . Moderate nonproliferative diabetic retinopathy of both eyes ([place]) 11/21/2014   11/14/14: Noted on retinal imaging; needs follow-up imaging in 6 months  05/22/16: Noted on retinal imaging again; needs follow-up imaging in 6 months  . Normocytic anemia   . TOBACCO USER 06/28/2006   Qualifier: Diagnosis of  By: Riccardo Dubin MD, Sherren Mocha    . Type II diabetes mellitus ([place]) 1994   diagnosed around 1994   Review of Systems:  ***  Physical Exam:  There were no vitals filed for this visit. ***  Assessment & Plan:   See Encounters Tab for problem based charting.  Patient {GC/GE:3044014::"discussed with","seen with"} Dr. {NAMES:[id number]::"Butcher","Granfortuna","E. Hoffman","Klima","Mullen","Narendra","Raines","Vincent"}

## 2018-06-17 ENCOUNTER — Telehealth: Payer: Self-pay | Admitting: *Deleted

## 2018-06-17 NOTE — Telephone Encounter (Signed)
Patient was under Dr Algis Downs care, will need to transition to new provider.  RN called patient to schedule follow up, as she is due for an appointment this month. RN left message asking her to please call back for an appointment. She will need to select a new provider. Landis Gandy, RN

## 2018-07-02 ENCOUNTER — Emergency Department (HOSPITAL_COMMUNITY): Payer: Medicaid Other

## 2018-07-02 ENCOUNTER — Inpatient Hospital Stay (HOSPITAL_COMMUNITY)
Admission: EM | Admit: 2018-07-02 | Discharge: 2018-07-11 | DRG: 853 | Disposition: A | Discharge status: Home / Self Care | Payer: Medicaid Other | Attending: Internal Medicine | Admitting: Internal Medicine

## 2018-07-02 ENCOUNTER — Encounter (HOSPITAL_COMMUNITY): Payer: Self-pay | Admitting: Emergency Medicine

## 2018-07-02 DIAGNOSIS — D649 Anemia, unspecified: Secondary | ICD-10-CM

## 2018-07-02 DIAGNOSIS — E669 Obesity, unspecified: Secondary | ICD-10-CM | POA: Diagnosis present

## 2018-07-02 DIAGNOSIS — N920 Excessive and frequent menstruation with regular cycle: Secondary | ICD-10-CM | POA: Diagnosis present

## 2018-07-02 DIAGNOSIS — L97529 Non-pressure chronic ulcer of other part of left foot with unspecified severity: Secondary | ICD-10-CM | POA: Diagnosis present

## 2018-07-02 DIAGNOSIS — N185 Chronic kidney disease, stage 5: Secondary | ICD-10-CM | POA: Diagnosis present

## 2018-07-02 DIAGNOSIS — Z833 Family history of diabetes mellitus: Secondary | ICD-10-CM

## 2018-07-02 DIAGNOSIS — Z794 Long term (current) use of insulin: Secondary | ICD-10-CM

## 2018-07-02 DIAGNOSIS — N186 End stage renal disease: Secondary | ICD-10-CM

## 2018-07-02 DIAGNOSIS — E860 Dehydration: Secondary | ICD-10-CM | POA: Diagnosis present

## 2018-07-02 DIAGNOSIS — N179 Acute kidney failure, unspecified: Secondary | ICD-10-CM | POA: Diagnosis present

## 2018-07-02 DIAGNOSIS — E1152 Type 2 diabetes mellitus with diabetic peripheral angiopathy with gangrene: Secondary | ICD-10-CM | POA: Diagnosis present

## 2018-07-02 DIAGNOSIS — Z810 Family history of intellectual disabilities: Secondary | ICD-10-CM

## 2018-07-02 DIAGNOSIS — E113393 Type 2 diabetes mellitus with moderate nonproliferative diabetic retinopathy without macular edema, bilateral: Secondary | ICD-10-CM | POA: Diagnosis present

## 2018-07-02 DIAGNOSIS — I96 Gangrene, not elsewhere classified: Secondary | ICD-10-CM | POA: Diagnosis present

## 2018-07-02 DIAGNOSIS — M86172 Other acute osteomyelitis, left ankle and foot: Secondary | ICD-10-CM

## 2018-07-02 DIAGNOSIS — K219 Gastro-esophageal reflux disease without esophagitis: Secondary | ICD-10-CM | POA: Diagnosis present

## 2018-07-02 DIAGNOSIS — D631 Anemia in chronic kidney disease: Secondary | ICD-10-CM | POA: Diagnosis present

## 2018-07-02 DIAGNOSIS — Z6826 Body mass index (BMI) 26.0-26.9, adult: Secondary | ICD-10-CM

## 2018-07-02 DIAGNOSIS — D62 Acute posthemorrhagic anemia: Secondary | ICD-10-CM | POA: Diagnosis present

## 2018-07-02 DIAGNOSIS — Z21 Asymptomatic human immunodeficiency virus [HIV] infection status: Secondary | ICD-10-CM | POA: Diagnosis present

## 2018-07-02 DIAGNOSIS — E872 Acidosis: Secondary | ICD-10-CM | POA: Diagnosis present

## 2018-07-02 DIAGNOSIS — I10 Essential (primary) hypertension: Secondary | ICD-10-CM | POA: Diagnosis present

## 2018-07-02 DIAGNOSIS — B20 Human immunodeficiency virus [HIV] disease: Secondary | ICD-10-CM | POA: Diagnosis present

## 2018-07-02 DIAGNOSIS — A419 Sepsis, unspecified organism: Principal | ICD-10-CM | POA: Diagnosis present

## 2018-07-02 DIAGNOSIS — E875 Hyperkalemia: Secondary | ICD-10-CM | POA: Diagnosis not present

## 2018-07-02 DIAGNOSIS — E1169 Type 2 diabetes mellitus with other specified complication: Secondary | ICD-10-CM | POA: Diagnosis present

## 2018-07-02 DIAGNOSIS — I120 Hypertensive chronic kidney disease with stage 5 chronic kidney disease or end stage renal disease: Secondary | ICD-10-CM | POA: Diagnosis present

## 2018-07-02 DIAGNOSIS — I70245 Atherosclerosis of native arteries of left leg with ulceration of other part of foot: Secondary | ICD-10-CM | POA: Diagnosis present

## 2018-07-02 DIAGNOSIS — B373 Candidiasis of vulva and vagina: Secondary | ICD-10-CM | POA: Diagnosis present

## 2018-07-02 DIAGNOSIS — K3184 Gastroparesis: Secondary | ICD-10-CM | POA: Diagnosis present

## 2018-07-02 DIAGNOSIS — E11621 Type 2 diabetes mellitus with foot ulcer: Secondary | ICD-10-CM | POA: Diagnosis present

## 2018-07-02 DIAGNOSIS — L03116 Cellulitis of left lower limb: Secondary | ICD-10-CM | POA: Diagnosis present

## 2018-07-02 DIAGNOSIS — Z87891 Personal history of nicotine dependence: Secondary | ICD-10-CM

## 2018-07-02 DIAGNOSIS — E1143 Type 2 diabetes mellitus with diabetic autonomic (poly)neuropathy: Secondary | ICD-10-CM | POA: Diagnosis present

## 2018-07-02 DIAGNOSIS — E1122 Type 2 diabetes mellitus with diabetic chronic kidney disease: Secondary | ICD-10-CM | POA: Diagnosis present

## 2018-07-02 DIAGNOSIS — E43 Unspecified severe protein-calorie malnutrition: Secondary | ICD-10-CM | POA: Diagnosis present

## 2018-07-02 LAB — URINALYSIS, ROUTINE W REFLEX MICROSCOPIC
Bilirubin Urine: NEGATIVE
Glucose, UA: 50 mg/dL — AB
Ketones, ur: NEGATIVE mg/dL
Leukocytes, UA: NEGATIVE
Nitrite: NEGATIVE
Protein, ur: 100 mg/dL — AB
Specific Gravity, Urine: 1.013 (ref 1.005–1.030)
pH: 5 (ref 5.0–8.0)

## 2018-07-02 LAB — I-STAT CG4 LACTIC ACID, ED
Lactic Acid, Venous: 0.52 mmol/L (ref 0.5–1.9)
Lactic Acid, Venous: 1.03 mmol/L (ref 0.5–1.9)

## 2018-07-02 MED ORDER — SODIUM CHLORIDE 0.9 % IV SOLN
2.0000 g | Freq: Once | INTRAVENOUS | Status: AC
  Administered 2018-07-02: 2 g via INTRAVENOUS
  Filled 2018-07-02: qty 2

## 2018-07-02 MED ORDER — VANCOMYCIN HCL 10 G IV SOLR
1500.0000 mg | Freq: Once | INTRAVENOUS | Status: AC
  Administered 2018-07-03: 1500 mg via INTRAVENOUS
  Filled 2018-07-02: qty 1500

## 2018-07-02 MED ORDER — OXYCODONE-ACETAMINOPHEN 5-325 MG PO TABS: 1.0000 | ORAL_TABLET | Freq: Once | ORAL | Status: DC

## 2018-07-02 MED ORDER — MORPHINE SULFATE (PF) 4 MG/ML IV SOLN
4.0000 mg | Freq: Once | INTRAVENOUS | Status: AC
  Administered 2018-07-02: 4 mg via INTRAVENOUS
  Filled 2018-07-02: qty 1

## 2018-07-02 MED ORDER — ACETAMINOPHEN 500 MG PO TABS
1000.0000 mg | ORAL_TABLET | Freq: Once | ORAL | Status: AC
  Administered 2018-07-02: 1000 mg via ORAL
  Filled 2018-07-02: qty 2

## 2018-07-02 NOTE — ED Notes (Signed)
Culture being sent in addition to urine

## 2018-07-02 NOTE — ED Provider Notes (Signed)
[facility] EMERGENCY DEPARTMENT Provider Note   CSN: [id number] Arrival date & time: 07/02/18  2108     History   Chief Complaint Chief Complaint  Patient presents with  . Foot Pain    HPI Kathy Frank is a 42 y.o. female with a h/o of DM Type II, HIV (RNA Viral load 80 and CD4 450 on 5/19), CKD stage III who presents to the emergency department from home with a chief complaint of left foot swelling and pain.  The patient endorses left foot pain and swelling that began 2 days ago.  She states she may have injured her foot when she slid on a cushion, causing her ankle to twist.  She reports that she elevated her left leg and soaked it in Epson salt and the swelling significantly improved.  She reports the constant pain has significantly worsened today she has become unable to put weight on her left foot due to the pain.  No previous surgery.  She denies of fevers, chills, left calf or knee pain, numbness, or weakness.  She also endorses a dry cough and URI symptoms over the last few days.  She reports she did not check her blood sugar at home today.  She has not missed any recent doses of her HIV medications.  She states that her daughter who may be coming to the emergency department later does not know about her HIV status.  The history is provided by the patient. No language interpreter was used.    Past Medical History:  Diagnosis Date  . CKD (chronic kidney disease) stage 3, GFR 30-59 ml/min (HCC) 06/19/2016   06/30/16: Seen by Dr. Posey Pronto [Nephrology]. Suspect etiologies to include diabetes and hypertension though cannot exclude HIV-associated nephropathy. Advised her against from using excessive Goody powder.  . CKD (chronic kidney disease), stage III ([place])   . Contact dermatitis 04/21/2016   07/29/16: Skin biopsy performed by [facility] notable for acute spongiotic dermatitis consistent with contact dermatitis.  . Cyclical vomiting    THC  induced???  . Depression   . Depression 06/28/2006   Qualifier: Diagnosis of  By: Riccardo Dubin MD, Sherren Mocha    . Diabetes mellitus type 2 in obese ([place]) 06/28/1994  . DKA (diabetic ketoacidoses) ([place]) 12/2014  . Erosive esophagitis   . Esophageal reflux   . Essential hypertension 11/16/2013  . Gastroparesis    ? diabetic  . GERD (gastroesophageal reflux disease)   . Human immunodeficiency virus (HIV) disease ([place]) dx'd 04/2016  . Human immunodeficiency virus (HIV) disease ([place]) 04/23/2016  . Hypertension   . Moderate nonproliferative diabetic retinopathy of both eyes ([place]) 11/21/2014   11/14/14: Noted on retinal imaging; needs follow-up imaging in 6 months  05/22/16: Noted on retinal imaging again; needs follow-up imaging in 6 months  . Normocytic anemia   . TOBACCO USER 06/28/2006   Qualifier: Diagnosis of  By: Riccardo Dubin MD, Sherren Mocha    . Type II diabetes mellitus ([place]) 1994   diagnosed around 1994    Patient Active Problem List   Diagnosis Date Noted  . Fecal incontinence 02/04/2018  . Healthcare maintenance 09/17/2017  . Right leg swelling 02/26/2017  . Superficial ulcerative lesion ([place]) 02/02/2017  . Aortic atherosclerosis ([place]) 09/30/2016  . Orthostatic hypotension 09/30/2016  . Hypokalemia 09/30/2016  . Diabetic foot ulcer associated with diabetes mellitus due to underlying condition ([place]) 07/08/2016  . CKD (chronic kidney disease) stage 3, GFR 30-59 ml/min (HCC) 06/19/2016  . Human immunodeficiency virus (  HIV) disease ([place]) 04/23/2016  . Gastroparesis   . Moderate nonproliferative diabetic retinopathy of both eyes ([place]) 11/21/2014  . Esophageal reflux   . Microscopic hematuria 10/31/2014  . Essential hypertension 11/16/2013  . TOBACCO USER 06/28/2006  . Depression 06/28/2006  . Diabetes mellitus type 2 in obese [facility]) 06/28/1994    Past Surgical History:  Procedure Laterality Date  . TUBAL LIGATION  2002     OB History   None      Home Medications    Prior to Admission  medications   Medication Sig Start Date End Date Taking? Authorizing Provider  amLODipine (NORVASC) 10 MG tablet Take 1 tablet (10 mg total) by mouth daily. 12/24/17 07/02/18 Yes Rice, Resa Miner, MD  citalopram (CELEXA) 20 MG tablet Take 1 tablet (20 mg total) by mouth daily. 12/24/17 12/24/18 Yes Rice, Resa Miner, MD  DESCOVY 200-25 MG tablet TAKE ONE TABLET BY MOUTH EVERY DAY. Patient taking differently: Take 1 tablet by mouth daily.  02/28/18  Yes Campbell Riches, MD  furosemide (LASIX) 40 MG tablet Take 1 tablet (40 mg total) by mouth daily. 12/24/17 07/02/18 Yes Rice, Resa Miner, MD  glucose blood (ACCU-CHEK GUIDE) test strip USE TO TEST BLOOD SUGAR 3 TIMES DAILY . Diagnosis code  E11.69, E66.9 07/09/17  Yes Chundi, Vahini, MD  Insulin Glargine (LANTUS) 100 UNIT/ML Solostar Pen Inject 15 Units into the skin daily at 10 pm. Patient taking differently: Inject 15 Units into the skin every morning.  12/24/17  Yes Rice, Resa Miner, MD  labetalol (NORMODYNE) 100 MG tablet Take 1 tablet (100 mg total) by mouth 2 (two) times daily. 02/04/18 07/02/18 Yes Chundi, Vahini, MD  liraglutide (VICTOZA) 18 MG/3ML SOPN Inject 0.2 mLs (1.2 mg total) into the skin daily. 02/04/18  Yes Chundi, Verne Spurr, MD  ondansetron (ZOFRAN ODT) 4 MG disintegrating tablet Take 1 tablet (4 mg total) by mouth every 8 (eight) hours as needed for nausea or vomiting. 08/06/17  Yes Jule Ser, DO  sennosides-docusate sodium (SENOKOT-S) 8.6-50 MG tablet Take 1 tablet by mouth daily as needed for constipation. 12/24/17  Yes Rice, Resa Miner, MD  TIVICAY 50 MG tablet TAKE ONE TABLET BY MOUTH EVERY DAY Patient taking differently: Take 50 mg by mouth daily.  02/28/18  Yes Campbell Riches, MD  diclofenac sodium (VOLTAREN) 1 % GEL Apply 2 g topically 4 (four) times daily. Patient not taking: Reported on 07/02/2018 12/24/17   Collier Salina, MD  dolutegravir (TIVICAY) 50 MG tablet TAKE ONE (1) TABLET BY MOUTH EVERY  DAY Patient not taking: Reported on 07/02/2018 12/24/17   Collier Salina, MD  emtricitabine-tenofovir AF (DESCOVY) 200-25 MG tablet TAKE ONE (1) TABLET BY MOUTH EVERY DAY Patient not taking: Reported on 07/02/2018 12/24/17   Collier Salina, MD  metoCLOPramide (REGLAN) 5 MG tablet Take 1 tablet (5 mg total) by mouth 3 (three) times daily before meals. Patient not taking: Reported on 07/02/2018 12/24/17   Collier Salina, MD  Multiple Vitamin (MULTIVITAMIN WITH MINERALS) TABS tablet Take 1 tablet by mouth daily. Patient not taking: Reported on 07/02/2018 12/24/17   Collier Salina, MD  pantoprazole (PROTONIX) 40 MG tablet Take 1 tablet (40 mg total) by mouth daily. Patient not taking: Reported on 07/02/2018 12/24/17   Collier Salina, MD  sodium bicarbonate 650 MG tablet Take 1 tablet (650 mg total) by mouth 2 (two) times daily. Patient not taking: Reported on 07/02/2018 12/24/17   Collier Salina, MD  triamcinolone cream (  KENALOG) 0.1 % Apply 1 application topically 2 (two) times daily. Patient not taking: Reported on 07/02/2018 02/04/18   Lars Mage, MD    Family History Family History  Problem Relation Age of Onset  . Diabetes Mother   . Diabetes Brother   . Diabetes Daughter   . Diabetes Daughter   . Mental retardation Brother        died from PNA  . Diabetes Maternal Grandmother     Social History Social History   Tobacco Use  . Smoking status: Former Smoker    Packs/day: 0.10    Years: 10.00    Pack years: 1.00    Types: Cigarettes    Start date: 11/17/2013    Last attempt to quit: 09/09/2014    Years since quitting: 3.8  . Smokeless tobacco: Never Used  Substance Use Topics  . Alcohol use: Yes    Comment: Occasionally.  . Drug use: Yes    Frequency: 4.0 times per week    Types: Marijuana     Allergies   Patient has no known allergies.   Review of Systems Review of Systems  Constitutional: Negative for activity change, chills and fever.   HENT: Positive for congestion.   Respiratory: Positive for cough. Negative for shortness of breath.   Cardiovascular: Negative for chest pain.  Gastrointestinal: Negative for abdominal pain.  Genitourinary: Negative for dysuria.  Musculoskeletal: Positive for arthralgias, gait problem and myalgias. Negative for back pain and joint swelling.  Skin: Negative for rash.  Allergic/Immunologic: Negative for immunocompromised state.  Neurological: Negative for weakness, numbness and headaches.  Psychiatric/Behavioral: Negative for confusion.   Physical Exam Updated Vital Signs BP (!) 159/73   Pulse (!) 110   Temp (!) 100.7 F (38.2 C) (Oral)   Resp (!) 28   Ht 5\' 5"  (1.651 m)   Wt 71.2 kg   LMP 06/27/2018   SpO2 100%   BMI 26.13 kg/m   Physical Exam  Constitutional: No distress.  Ill appearing.   HENT:  Head: Normocephalic.  Eyes: Conjunctivae are normal.  Neck: Neck supple.  Cardiovascular: Tachycardia present.  Pulmonary/Chest: Effort normal. No respiratory distress.  Abdominal: Soft. She exhibits no distension.  Musculoskeletal: She exhibits tenderness.  Left foot is mildly edematous with diffuse erythema and warmth throughout the foot and ankle. The left second digit is swollen and red.    Neurological: She is alert.  Skin: Skin is warm. No rash noted.  Psychiatric: Her behavior is normal.  Nursing note and vitals reviewed.  ED Treatments / Results  Labs (all labs ordered are listed, but only abnormal results are displayed) Labs Reviewed  URINALYSIS, ROUTINE W REFLEX MICROSCOPIC - Abnormal; Notable for the following components:      Result Value   APPearance HAZY (*)    Glucose, UA 50 (*)    Hgb urine dipstick MODERATE (*)    Protein, ur 100 (*)    Bacteria, UA RARE (*)    All other components within normal limits  CULTURE, BLOOD (ROUTINE X 2)  CULTURE, BLOOD (ROUTINE X 2)  CBC WITH DIFFERENTIAL/PLATELET  COMPREHENSIVE METABOLIC PANEL  I-STAT CG4 LACTIC ACID,  ED  I-STAT CG4 LACTIC ACID, ED    EKG EKG Interpretation  Date/Time:  Saturday July 02 2018 21:48:41 EST Ventricular Rate:  105 PR Interval:    QRS Duration: 79 QT Interval:  326 QTC Calculation: 431 R Axis:   80 Text Interpretation:  Sinus tachycardia Borderline repolarization abnormality No significant change since last  tracing Confirmed by Dorie Rank [phone]) on 07/02/2018 9:52:29 PM   Radiology Dg Chest 2 View  Result Date: 07/02/2018 CLINICAL DATA:  Sepsis, HTN, DM, ex-smoker EXAM: CHEST - 2 VIEW COMPARISON:  Chest x-ray dated 09/12/2017. FINDINGS: Heart size and mediastinal contours are within normal limits, stable. Lungs are clear. No pleural effusion or pneumothorax seen. Osseous structures about the chest are unremarkable. IMPRESSION: No active cardiopulmonary disease. No evidence of pneumonia or pulmonary edema. Electronically Signed   By: Franki Cabot M.D.   On: 07/02/2018 23:03   Dg Foot Complete Left  Result Date: 07/02/2018 CLINICAL DATA:  Left foot pain and redness. EXAM: LEFT FOOT - COMPLETE 3+ VIEW COMPARISON:  None. FINDINGS: There is no evidence of fracture or dislocation. There is no evidence of arthropathy or other focal bone abnormality. Mild dorsal soft tissue swelling. IMPRESSION: No acute osseous injury of the left foot. Mild dorsal soft tissue swelling. Electronically Signed   By: Ulyses Jarred M.D.   On: 07/02/2018 23:06    Procedures .Critical Care Performed by: Joanne Gavel, PA-C Authorized by: Joanne Gavel, PA-C   Critical care provider statement:    Critical care time (minutes):  20   Critical care time was exclusive of:  Separately billable procedures and treating other patients and teaching time   I assumed direction of critical care for this patient from another provider in my specialty: no     (including critical care time)  Medications Ordered in ED Medications  vancomycin (VANCOCIN) 1,500 mg in sodium chloride 0.9 % 500 mL IVPB  (has no administration in time range)  morphine 4 MG/ML injection 4 mg (4 mg Intravenous Given 07/02/18 2213)  acetaminophen (TYLENOL) tablet 1,000 mg (1,000 mg Oral Given 07/02/18 2256)  ceFEPIme (MAXIPIME) 2 g in sodium chloride 0.9 % 100 mL IVPB (2 g Intravenous New Bag/Given 07/02/18 2207)     Initial Impression / Assessment and Plan / ED Course  I have reviewed the triage vital signs and the nursing notes.  Pertinent labs & imaging results that were available during my care of the patient were reviewed by me and considered in my medical decision making (see chart for details).     42 year old female with a h/o of DM Type II, HIV (RNA Viral load 80 and CD4 450 on 5/19), CKD stage III resenting with left foot pain and swelling.  She denies fever and chills, but is a febrile to 100.7 orally on arrival to the ED with mild tachycardia.  Left foot is erythematous, edematous, and warm to the touch, concerning for source of infection.  Code sepsis initiated.  Cefepime and vancomycin have empirically started. Morphine for pain control and Tylenol for fever. Labs are pending. Patient has been moved to the acute section of the ED and care from the team in this area will be initiated.   Final Clinical Impressions(s) / ED Diagnoses   Final diagnoses:  None    ED Discharge Orders    None       Joanne Gavel, PA-C 07/02/18 2310    Carmin Muskrat, MD 07/02/18 2348    Carmin Muskrat, MD 07/02/18 2353

## 2018-07-02 NOTE — ED Triage Notes (Signed)
Pt c/o L foot pain and swelling onset 2 days ago, pt states she soaked foot in epsom salt yesterday, some relief from swelling but now having severe pain. L foot very swollen/warm.

## 2018-07-02 NOTE — ED Provider Notes (Signed)
Medical screening examination/treatment/procedure(s) were conducted as a shared visit with non-physician practitioner(s) and myself.  I personally evaluated the patient during the encounter.  EKG Interpretation  Date/Time:  Saturday July 02 2018 21:48:41 EST Ventricular Rate:  105 PR Interval:    QRS Duration: 79 QT Interval:  326 QTC Calculation: 431 R Axis:   80 Text Interpretation:  Sinus tachycardia Borderline repolarization abnormality No significant change since last tracing Confirmed by Dorie Rank [phone]) on 07/02/2018 9:52:29 PM  Patient presented to the emergency room for evaluation of foot pain and fever.  She appears to have cellulitis.  She remains normotensive.  No elevation her lactic acid level.  No signs of severe sepsis.  She is immunocompromise.  I think she would benefit from IV antibiotics and further monitoring in the hospital.   Dorie Rank, MD 07/02/18 2344

## 2018-07-03 ENCOUNTER — Inpatient Hospital Stay (HOSPITAL_COMMUNITY): Payer: Medicaid Other

## 2018-07-03 ENCOUNTER — Encounter (HOSPITAL_COMMUNITY): Payer: Self-pay | Admitting: Internal Medicine

## 2018-07-03 ENCOUNTER — Other Ambulatory Visit: Payer: Self-pay

## 2018-07-03 ENCOUNTER — Emergency Department (HOSPITAL_COMMUNITY): Payer: Medicaid Other

## 2018-07-03 DIAGNOSIS — E1169 Type 2 diabetes mellitus with other specified complication: Secondary | ICD-10-CM | POA: Diagnosis present

## 2018-07-03 DIAGNOSIS — I120 Hypertensive chronic kidney disease with stage 5 chronic kidney disease or end stage renal disease: Secondary | ICD-10-CM | POA: Diagnosis present

## 2018-07-03 DIAGNOSIS — E1152 Type 2 diabetes mellitus with diabetic peripheral angiopathy with gangrene: Secondary | ICD-10-CM | POA: Diagnosis present

## 2018-07-03 DIAGNOSIS — Z21 Asymptomatic human immunodeficiency virus [HIV] infection status: Secondary | ICD-10-CM

## 2018-07-03 DIAGNOSIS — Z794 Long term (current) use of insulin: Secondary | ICD-10-CM | POA: Diagnosis not present

## 2018-07-03 DIAGNOSIS — M86172 Other acute osteomyelitis, left ankle and foot: Secondary | ICD-10-CM

## 2018-07-03 DIAGNOSIS — Z01810 Encounter for preprocedural cardiovascular examination: Secondary | ICD-10-CM | POA: Diagnosis not present

## 2018-07-03 DIAGNOSIS — D631 Anemia in chronic kidney disease: Secondary | ICD-10-CM | POA: Diagnosis present

## 2018-07-03 DIAGNOSIS — E1122 Type 2 diabetes mellitus with diabetic chronic kidney disease: Secondary | ICD-10-CM | POA: Diagnosis present

## 2018-07-03 DIAGNOSIS — N179 Acute kidney failure, unspecified: Secondary | ICD-10-CM | POA: Diagnosis present

## 2018-07-03 DIAGNOSIS — Z8631 Personal history of diabetic foot ulcer: Secondary | ICD-10-CM

## 2018-07-03 DIAGNOSIS — M869 Osteomyelitis, unspecified: Secondary | ICD-10-CM

## 2018-07-03 DIAGNOSIS — E1151 Type 2 diabetes mellitus with diabetic peripheral angiopathy without gangrene: Secondary | ICD-10-CM | POA: Diagnosis not present

## 2018-07-03 DIAGNOSIS — L03116 Cellulitis of left lower limb: Secondary | ICD-10-CM

## 2018-07-03 DIAGNOSIS — R509 Fever, unspecified: Secondary | ICD-10-CM | POA: Diagnosis not present

## 2018-07-03 DIAGNOSIS — A419 Sepsis, unspecified organism: Secondary | ICD-10-CM | POA: Diagnosis present

## 2018-07-03 DIAGNOSIS — I739 Peripheral vascular disease, unspecified: Secondary | ICD-10-CM | POA: Diagnosis not present

## 2018-07-03 DIAGNOSIS — K219 Gastro-esophageal reflux disease without esophagitis: Secondary | ICD-10-CM

## 2018-07-03 DIAGNOSIS — Z810 Family history of intellectual disabilities: Secondary | ICD-10-CM | POA: Diagnosis not present

## 2018-07-03 DIAGNOSIS — N920 Excessive and frequent menstruation with regular cycle: Secondary | ICD-10-CM

## 2018-07-03 DIAGNOSIS — N184 Chronic kidney disease, stage 4 (severe): Secondary | ICD-10-CM | POA: Diagnosis not present

## 2018-07-03 DIAGNOSIS — D62 Acute posthemorrhagic anemia: Secondary | ICD-10-CM | POA: Diagnosis present

## 2018-07-03 DIAGNOSIS — Z87891 Personal history of nicotine dependence: Secondary | ICD-10-CM | POA: Diagnosis not present

## 2018-07-03 DIAGNOSIS — Z9851 Tubal ligation status: Secondary | ICD-10-CM

## 2018-07-03 DIAGNOSIS — Z9889 Other specified postprocedural states: Secondary | ICD-10-CM

## 2018-07-03 DIAGNOSIS — N185 Chronic kidney disease, stage 5: Secondary | ICD-10-CM

## 2018-07-03 DIAGNOSIS — Z79899 Other long term (current) drug therapy: Secondary | ICD-10-CM

## 2018-07-03 DIAGNOSIS — K3184 Gastroparesis: Secondary | ICD-10-CM | POA: Diagnosis present

## 2018-07-03 DIAGNOSIS — D649 Anemia, unspecified: Secondary | ICD-10-CM | POA: Diagnosis not present

## 2018-07-03 DIAGNOSIS — D500 Iron deficiency anemia secondary to blood loss (chronic): Secondary | ICD-10-CM

## 2018-07-03 DIAGNOSIS — Z833 Family history of diabetes mellitus: Secondary | ICD-10-CM | POA: Diagnosis not present

## 2018-07-03 DIAGNOSIS — I96 Gangrene, not elsewhere classified: Secondary | ICD-10-CM | POA: Diagnosis present

## 2018-07-03 DIAGNOSIS — E669 Obesity, unspecified: Secondary | ICD-10-CM | POA: Diagnosis present

## 2018-07-03 DIAGNOSIS — E43 Unspecified severe protein-calorie malnutrition: Secondary | ICD-10-CM | POA: Diagnosis present

## 2018-07-03 DIAGNOSIS — E113393 Type 2 diabetes mellitus with moderate nonproliferative diabetic retinopathy without macular edema, bilateral: Secondary | ICD-10-CM | POA: Diagnosis present

## 2018-07-03 DIAGNOSIS — E872 Acidosis: Secondary | ICD-10-CM | POA: Diagnosis present

## 2018-07-03 DIAGNOSIS — B20 Human immunodeficiency virus [HIV] disease: Secondary | ICD-10-CM | POA: Diagnosis not present

## 2018-07-03 LAB — COMPREHENSIVE METABOLIC PANEL
ALT: 8 U/L (ref 0–44)
AST: 9 U/L — ABNORMAL LOW (ref 15–41)
Albumin: 2.4 g/dL — ABNORMAL LOW (ref 3.5–5.0)
Alkaline Phosphatase: 86 U/L (ref 38–126)
Anion gap: 8 (ref 5–15)
BUN: 47 mg/dL — ABNORMAL HIGH (ref 6–20)
CO2: 13 mmol/L — ABNORMAL LOW (ref 22–32)
Calcium: 7.7 mg/dL — ABNORMAL LOW (ref 8.9–10.3)
Chloride: 114 mmol/L — ABNORMAL HIGH (ref 98–111)
Creatinine, Ser: 6.74 mg/dL — ABNORMAL HIGH (ref 0.44–1.00)
GFR calc Af Amer: 8 mL/min — ABNORMAL LOW (ref >60)
GFR calc non Af Amer: 7 mL/min — ABNORMAL LOW (ref >60)
Glucose, Bld: 152 mg/dL — ABNORMAL HIGH (ref 70–99)
Potassium: 4.7 mmol/L (ref 3.5–5.1)
Sodium: 135 mmol/L (ref 135–145)
Total Bilirubin: 0.2 mg/dL — ABNORMAL LOW (ref 0.3–1.2)
Total Protein: 6.6 g/dL (ref 6.5–8.1)

## 2018-07-03 LAB — BASIC METABOLIC PANEL
Anion gap: 11 (ref 5–15)
BUN: 45 mg/dL — ABNORMAL HIGH (ref 6–20)
CO2: 11 mmol/L — ABNORMAL LOW (ref 22–32)
Calcium: 8.1 mg/dL — ABNORMAL LOW (ref 8.9–10.3)
Chloride: 112 mmol/L — ABNORMAL HIGH (ref 98–111)
Creatinine, Ser: 5.75 mg/dL — ABNORMAL HIGH (ref 0.44–1.00)
GFR calc Af Amer: 10 mL/min — ABNORMAL LOW (ref >60)
GFR calc non Af Amer: 8 mL/min — ABNORMAL LOW (ref >60)
Glucose, Bld: 167 mg/dL — ABNORMAL HIGH (ref 70–99)
Potassium: 5.1 mmol/L (ref 3.5–5.1)
Sodium: 134 mmol/L — ABNORMAL LOW (ref 135–145)

## 2018-07-03 LAB — ABO/RH: ABO/RH(D): B POS

## 2018-07-03 LAB — CBC WITH DIFFERENTIAL/PLATELET
Abs Immature Granulocytes: 0.08 10*3/uL — ABNORMAL HIGH (ref 0.00–0.07)
Basophils Absolute: 0.1 10*3/uL (ref 0.0–0.1)
Basophils Relative: 0 %
Eosinophils Absolute: 0.2 10*3/uL (ref 0.0–0.5)
Eosinophils Relative: 2 %
HCT: 22.7 % — ABNORMAL LOW (ref 36.0–46.0)
Hemoglobin: 6.8 g/dL — CL (ref 12.0–15.0)
Immature Granulocytes: 1 %
Lymphocytes Relative: 8 %
Lymphs Abs: 1 10*3/uL (ref 0.7–4.0)
MCH: 30.5 pg (ref 26.0–34.0)
MCHC: 30 g/dL (ref 30.0–36.0)
MCV: 101.8 fL — ABNORMAL HIGH (ref 80.0–100.0)
Monocytes Absolute: 1 10*3/uL (ref 0.1–1.0)
Monocytes Relative: 8 %
Neutro Abs: 10.2 10*3/uL — ABNORMAL HIGH (ref 1.7–7.7)
Neutrophils Relative %: 81 %
Platelets: 339 10*3/uL (ref 150–400)
RBC: 2.23 MIL/uL — ABNORMAL LOW (ref 3.87–5.11)
RDW: 14 % (ref 11.5–15.5)
WBC: 12.5 10*3/uL — ABNORMAL HIGH (ref 4.0–10.5)
nRBC: 0 % (ref 0.0–0.2)

## 2018-07-03 LAB — RENAL FUNCTION PANEL
Albumin: 2.2 g/dL — ABNORMAL LOW (ref 3.5–5.0)
Anion gap: 8 (ref 5–15)
BUN: 46 mg/dL — ABNORMAL HIGH (ref 6–20)
CO2: 12 mmol/L — ABNORMAL LOW (ref 22–32)
Calcium: 7.9 mg/dL — ABNORMAL LOW (ref 8.9–10.3)
Chloride: 114 mmol/L — ABNORMAL HIGH (ref 98–111)
Creatinine, Ser: 6.44 mg/dL — ABNORMAL HIGH (ref 0.44–1.00)
GFR calc Af Amer: 8 mL/min — ABNORMAL LOW (ref >60)
GFR calc non Af Amer: 7 mL/min — ABNORMAL LOW (ref >60)
Glucose, Bld: 155 mg/dL — ABNORMAL HIGH (ref 70–99)
Phosphorus: 4.7 mg/dL — ABNORMAL HIGH (ref 2.5–4.6)
Potassium: 4.6 mmol/L (ref 3.5–5.1)
Sodium: 134 mmol/L — ABNORMAL LOW (ref 135–145)

## 2018-07-03 LAB — GLUCOSE, CAPILLARY
Glucose-Capillary: 156 mg/dL — ABNORMAL HIGH (ref 70–99)
Glucose-Capillary: 169 mg/dL — ABNORMAL HIGH (ref 70–99)
Glucose-Capillary: 148 mg/dL — ABNORMAL HIGH (ref 70–99)
Glucose-Capillary: 197 mg/dL — ABNORMAL HIGH (ref 70–99)
Glucose-Capillary: 108 mg/dL — ABNORMAL HIGH (ref 70–99)

## 2018-07-03 LAB — PROTIME-INR
INR: 1.19
Prothrombin Time: 15 seconds (ref 11.4–15.2)

## 2018-07-03 LAB — IRON AND TIBC
Iron: 7 ug/dL — ABNORMAL LOW (ref 28–170)
Saturation Ratios: 3 % — ABNORMAL LOW (ref 10.4–31.8)
TIBC: 252 ug/dL (ref 250–450)
UIBC: 245 ug/dL

## 2018-07-03 LAB — HEMOGLOBIN AND HEMATOCRIT, BLOOD
HCT: 25.5 % — ABNORMAL LOW (ref 36.0–46.0)
Hemoglobin: 7.9 g/dL — ABNORMAL LOW (ref 12.0–15.0)

## 2018-07-03 LAB — CK: Total CK: 64 U/L (ref 38–234)

## 2018-07-03 LAB — APTT: aPTT: 40 seconds — ABNORMAL HIGH (ref 24–36)

## 2018-07-03 LAB — PREPARE RBC (CROSSMATCH)

## 2018-07-03 MED ORDER — ADULT MULTIVITAMIN W/MINERALS CH
1.0000 | ORAL_TABLET | Freq: Every day | ORAL | Status: DC
  Administered 2018-07-03 – 2018-07-10 (×7): 1 via ORAL
  Filled 2018-07-03 (×7): qty 1

## 2018-07-03 MED ORDER — HYDROMORPHONE HCL 1 MG/ML IJ SOLN
0.5000 mg | INTRAMUSCULAR | Status: DC | PRN
  Administered 2018-07-03 – 2018-07-04 (×5): 0.5 mg via INTRAVENOUS
  Filled 2018-07-03 (×5): qty 1

## 2018-07-03 MED ORDER — ONDANSETRON HCL 4 MG/2ML IJ SOLN
4.0000 mg | Freq: Three times a day (TID) | INTRAMUSCULAR | Status: DC | PRN
  Administered 2018-07-03 – 2018-07-04 (×2): 4 mg via INTRAVENOUS
  Filled 2018-07-03 (×2): qty 2

## 2018-07-03 MED ORDER — DOLUTEGRAVIR SODIUM 50 MG PO TABS
50.0000 mg | ORAL_TABLET | Freq: Every day | ORAL | Status: DC
  Administered 2018-07-03 – 2018-07-04 (×2): 50 mg via ORAL
  Filled 2018-07-03 (×3): qty 1

## 2018-07-03 MED ORDER — INSULIN ASPART 100 UNIT/ML ~~LOC~~ SOLN
0.0000 [IU] | Freq: Three times a day (TID) | SUBCUTANEOUS | Status: DC
  Administered 2018-07-03 (×2): 2 [IU] via SUBCUTANEOUS
  Administered 2018-07-03: 1 [IU] via SUBCUTANEOUS
  Administered 2018-07-04: 3 [IU] via SUBCUTANEOUS
  Administered 2018-07-04 – 2018-07-06 (×4): 1 [IU] via SUBCUTANEOUS
  Administered 2018-07-08: 9 [IU] via SUBCUTANEOUS
  Administered 2018-07-08 – 2018-07-09 (×2): 2 [IU] via SUBCUTANEOUS
  Administered 2018-07-10: 1 [IU] via SUBCUTANEOUS

## 2018-07-03 MED ORDER — PANTOPRAZOLE SODIUM 40 MG PO TBEC
40.0000 mg | DELAYED_RELEASE_TABLET | Freq: Every day | ORAL | Status: DC
  Administered 2018-07-03 – 2018-07-04 (×2): 40 mg via ORAL
  Filled 2018-07-03 (×2): qty 1

## 2018-07-03 MED ORDER — LACTATED RINGERS IV SOLN
INTRAVENOUS | Status: DC
  Administered 2018-07-03: 03:00:00 via INTRAVENOUS

## 2018-07-03 MED ORDER — LABETALOL HCL 5 MG/ML IV SOLN
10.0000 mg | Freq: Once | INTRAVENOUS | Status: AC
  Administered 2018-07-03: 10 mg via INTRAVENOUS
  Filled 2018-07-03: qty 4

## 2018-07-03 MED ORDER — SODIUM CHLORIDE 0.9% IV SOLUTION
Freq: Once | INTRAVENOUS | Status: AC
  Administered 2018-07-03: 05:00:00 via INTRAVENOUS

## 2018-07-03 MED ORDER — ACETAMINOPHEN 650 MG RE SUPP: 650.0000 mg | RECTAL | Status: DC | PRN

## 2018-07-03 MED ORDER — ACETAMINOPHEN 325 MG PO TABS
650.0000 mg | ORAL_TABLET | Freq: Four times a day (QID) | ORAL | Status: DC | PRN
  Administered 2018-07-03 (×2): 650 mg via ORAL
  Filled 2018-07-03 (×2): qty 2

## 2018-07-03 MED ORDER — EMTRICITABINE-TENOFOVIR AF 200-25 MG PO TABS
1.0000 | ORAL_TABLET | Freq: Every day | ORAL | Status: DC
  Administered 2018-07-03 – 2018-07-04 (×2): 1 via ORAL
  Filled 2018-07-03 (×3): qty 1

## 2018-07-03 MED ORDER — POLYETHYLENE GLYCOL 3350 17 G PO PACK
17.0000 g | PACK | Freq: Every day | ORAL | Status: DC | PRN
  Administered 2018-07-05 – 2018-07-10 (×3): 17 g via ORAL
  Filled 2018-07-03 (×3): qty 1

## 2018-07-03 MED ORDER — SODIUM CHLORIDE 0.9 % IV BOLUS: 1000.0000 mL | Freq: Once | INTRAVENOUS | Status: DC

## 2018-07-03 MED ORDER — SODIUM CHLORIDE 0.9 % IV SOLN
1.0000 g | INTRAVENOUS | Status: DC
  Administered 2018-07-03: 1 g via INTRAVENOUS
  Filled 2018-07-03: qty 1

## 2018-07-03 MED ORDER — CITALOPRAM HYDROBROMIDE 20 MG PO TABS
20.0000 mg | ORAL_TABLET | Freq: Every day | ORAL | Status: DC
  Administered 2018-07-03 – 2018-07-11 (×9): 20 mg via ORAL
  Filled 2018-07-03 (×9): qty 1

## 2018-07-03 MED ORDER — FENTANYL CITRATE (PF) 100 MCG/2ML IJ SOLN: 50.0000 ug | Freq: Once | INTRAMUSCULAR | Status: DC

## 2018-07-03 MED ORDER — SODIUM BICARBONATE 650 MG PO TABS
650.0000 mg | ORAL_TABLET | Freq: Two times a day (BID) | ORAL | Status: DC
  Administered 2018-07-03 – 2018-07-04 (×3): 650 mg via ORAL
  Filled 2018-07-03 (×3): qty 1

## 2018-07-03 MED ORDER — INSULIN GLARGINE 100 UNIT/ML ~~LOC~~ SOLN
8.0000 [IU] | Freq: Every day | SUBCUTANEOUS | Status: DC
  Administered 2018-07-03 – 2018-07-10 (×9): 8 [IU] via SUBCUTANEOUS
  Filled 2018-07-03 (×9): qty 0.08

## 2018-07-03 MED ORDER — VANCOMYCIN VARIABLE DOSE PER UNSTABLE RENAL FUNCTION (PHARMACIST DOSING): Status: DC

## 2018-07-03 NOTE — Plan of Care (Signed)
°  Problem: Coping: °Goal: Level of anxiety will decrease °Outcome: Progressing °  °Problem: Elimination: °Goal: Will not experience complications related to bowel motility °Outcome: Progressing °Goal: Will not experience complications related to urinary retention °Outcome: Progressing °  °Problem: Pain Managment: °Goal: General experience of comfort will improve °Outcome: Progressing °  °Problem: Safety: °Goal: Ability to remain free from injury will improve °Outcome: Progressing °  °

## 2018-07-03 NOTE — H&P (Signed)
Date: 07/03/2018               Patient Name:  Kathy Frank MRN: [id number]  DOB: 03/09/1976 Age / Sex: 42 y.o., female   PCP: Kathy Mage, MD         Medical Service: Internal Medicine Teaching Service         Attending Physician: Dr. Bartholomew Crews, MD    First Contact: Dr. Molli Frank Pager: [phone]  Second Contact: Dr. Welford Frank Pager: [phone]       After Hours (After 5p/  First Contact Pager: [phone]  weekends / holidays): Second Contact Pager: [phone]   Chief Complaint: Foot pain  History of Present Illness:  Kathy Frank is a 42 yo F w/ PMH of HIV (CD4 450 12/2017) on descovy and tivicay, T2DM, htn, GERD, and CKD3 presenting with foot pain. She was examined and evaluated at bedside in ED. She states she was in her usual state of health until 'couple days ago' when she noticed increasing left foot swelling with pain without any inciting event or prior injury. She initially believes it was due to ill-fitting steel-toed shoed she wear for work and was treating it with elevation and Epson salt baths. She noted that her swelling seem to improve but her foot pain increasingly worsened until she could not walk anymore. She denies taking any NSAIDS. She mentions that she regularly follows with a podiatrist and has had prior ulcers that healed well but she did not notice any open ulcers or wound prior to this event. She mentions that she takes all of her diabetic and HIV medication daily. She states she also experienced fever and malaise but states she 'is usually warm' and did not measure her temperature.   She also states that her last periods has been significantly heavier than her usual this time. She mentions she has regularl monthly periods that last about 5 days that are manageable with tampons alone. For this episode, which occurred about a week earlier than her usual schedule, the bleeding was so heavy that she could not use tampons and the blood soaked  through her pads and clothes daily. She mentions seeing some blood clots as well and states her period is on day 6. She denies any prior history of menorrhagia or dysmenorrhea. Denies any other sources of bleeding. She had bilateral tubal ligation performed in 2002  On review of systems, she is endorsing some dry cough and mild headaches. She denies any blurry vision, denies any nausea, vomiting, diarrhea, constipation. Denies any chest pain, palpitations, dyspnea, dysuria, frequency, urgency.  In the ED she was found to be febrile, tachycadic with leukocytosis at 12.5 and hgb of 6.8. Mild dorsal soft tissue of the left foot was identified on X-ray. She was started on cefepime and vancomycin.  Meds: Current Meds  Medication Sig  . amLODipine (NORVASC) 10 MG tablet Take 1 tablet (10 mg total) by mouth daily.  . citalopram (CELEXA) 20 MG tablet Take 1 tablet (20 mg total) by mouth daily.  . DESCOVY 200-25 MG tablet TAKE ONE TABLET BY MOUTH EVERY DAY. (Patient taking differently: Take 1 tablet by mouth daily. )  . furosemide (LASIX) 40 MG tablet Take 1 tablet (40 mg total) by mouth daily.  Marland Kitchen glucose blood (ACCU-CHEK GUIDE) test strip USE TO TEST BLOOD SUGAR 3 TIMES DAILY . Diagnosis code  E11.69, E66.9  . Insulin Glargine (LANTUS) 100 UNIT/ML Solostar Pen Inject 15 Units into the skin  daily at 10 pm. (Patient taking differently: Inject 15 Units into the skin every morning. )  . labetalol (NORMODYNE) 100 MG tablet Take 1 tablet (100 mg total) by mouth 2 (two) times daily.  Marland Kitchen liraglutide (VICTOZA) 18 MG/3ML SOPN Inject 0.2 mLs (1.2 mg total) into the skin daily.  . ondansetron (ZOFRAN ODT) 4 MG disintegrating tablet Take 1 tablet (4 mg total) by mouth every 8 (eight) hours as needed for nausea or vomiting.  . sennosides-docusate sodium (SENOKOT-S) 8.6-50 MG tablet Take 1 tablet by mouth daily as needed for constipation.  Marland Kitchen TIVICAY 50 MG tablet TAKE ONE TABLET BY MOUTH EVERY DAY (Patient taking  differently: Take 50 mg by mouth daily. )     Allergies: Allergies as of 07/02/2018  . (No Known Allergies)   Past Medical History:  Diagnosis Date  . CKD (chronic kidney disease) stage 3, GFR 30-59 ml/min (HCC) 06/19/2016   06/30/16: Seen by Kathy Frank [Nephrology]. Suspect etiologies to include diabetes and hypertension though cannot exclude HIV-associated nephropathy. Advised her against from using excessive Goody powder.  . CKD (chronic kidney disease), stage III ([place])   . Contact dermatitis 04/21/2016   07/29/16: Skin biopsy performed by [facility] notable for acute spongiotic dermatitis consistent with contact dermatitis.  . Cyclical vomiting    THC induced???  . Depression   . Depression 06/28/2006   Qualifier: Diagnosis of  By: Kathy Dubin MD, Kathy Frank    . Diabetes mellitus type 2 in obese ([place]) 06/28/1994  . DKA (diabetic ketoacidoses) ([place]) 12/2014  . Erosive esophagitis   . Esophageal reflux   . Essential hypertension 11/16/2013  . Gastroparesis    ? diabetic  . GERD (gastroesophageal reflux disease)   . Human immunodeficiency virus (HIV) disease ([place]) dx'd 04/2016  . Human immunodeficiency virus (HIV) disease ([place]) 04/23/2016  . Hypertension   . Moderate nonproliferative diabetic retinopathy of both eyes ([place]) 11/21/2014   11/14/14: Noted on retinal imaging; needs follow-up imaging in 6 months  05/22/16: Noted on retinal imaging again; needs follow-up imaging in 6 months  . Normocytic anemia   . TOBACCO USER 06/28/2006   Qualifier: Diagnosis of  By: Kathy Dubin MD, Kathy Frank    . Type II diabetes mellitus ([place]) 1994   diagnosed around 60    Family History:  Family History  Problem Relation Age of Onset  . Diabetes Mother   . Diabetes Brother   . Diabetes Daughter   . Diabetes Daughter   . Mental retardation Brother        died from PNA  . 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 Denies any EtOH or current tobacco use. States smokes marijuana regularly last smoked earlier today. Social History   Tobacco Use  . Smoking status: Former Smoker    Packs/day: 0.10    Years: 10.00    Pack years: 1.00    Types: Cigarettes    Start date: 11/17/2013    Last attempt to quit: 09/09/2014    Years since quitting: 3.8  . Smokeless tobacco: Never Used  Substance Use Topics  . Alcohol use: Yes    Comment: Occasionally.  . Drug use: Yes    Frequency: 4.0 times per week    Types: Marijuana    Review of Systems: A complete ROS was negative except as per HPI.   Physical Exam: Blood pressure Marland Kitchen)  168/83, pulse (!) 102, temperature (!) 101.4 F (38.6 C), temperature source Oral, resp. rate (!) 22, height 5\' 5"  (1.651 m), weight 71.2 kg, last menstrual period 06/27/2018, SpO2 100 %. Physical Exam  Constitutional: She is oriented to person, place, and time and well-developed, well-nourished, and in no distress. No distress.  HENT:  Head: Normocephalic and atraumatic.  Mouth/Throat: Oropharynx is clear and moist. No oropharyngeal exudate.  Eyes: Pupils are equal, round, and reactive to light. Conjunctivae and EOM are normal. No scleral icterus.  Neck: Normal range of motion. Neck supple. No JVD present.  Cardiovascular: Regular rhythm, normal heart sounds and intact distal pulses.  No murmur heard. Tachycardic  Pulmonary/Chest: Effort normal and breath sounds normal. She has no rales.  Abdominal: Soft. Bowel sounds are normal. She exhibits no distension. There is no tenderness.  Musculoskeletal: Normal range of motion. She exhibits edema (Left leg nonpitting edema up to ankles), tenderness (Left dorsal foot, ankle and lower calf exquisitely tender to palpation and movement and warm to touch.) and deformity (Left foot 2nd and 4th toe appear husky with increased edema compared to other toes).  Lymphadenopathy:    She has  no cervical adenopathy.  Neurological: She is alert and oriented to person, place, and time. Coordination normal. GCS score is 15.  Skin: Skin is warm and dry. Rash (superficial ulcers on extremities in various stages of healing without purulent drainage, bleeding or foul smell) noted. She is not diaphoretic.   EKG: personally reviewed my interpretation is sinus tachycardia, normal axis, no ST elevation or depression.   CXR: personally reviewed my interpretation is normal mediastinum, no lobar consolidation, no pleural effusion, no vascular congestion  Assessment & Plan by Problem: Active Problems:   AKI (acute kidney injury) [facility])  Mrs.Autrey is 42 yo F w/ PMH of HIV (CD4 450 12/2017) on descovy and tivicay, T2DM, htn, GERD, and CKD3 presenting with left foot pain with fever and leukocytosis. She is high risk for infectious due to uncontrolled diabetes and HIV. Unclear inciting event for infection of the foot but on physical exam some of her toes may be gangrenous. Will require further imaging to assess. Started on empiric systemic abx therapy in ED which we will continue until sensitivities can help [place] narrow choices. She also had significant anemia which may or may not be related and we will closely monitor hgb while giving pRBC as needed. Her admit creatine is most likely 2/2 dehydration in setting of infection and we will provide fluid resuscitation and monitor her renal function.   Fever, leukocytosis 2/2 left foot cellulitis 101.57F recorded in ED. 2 day hx of swollen left foot w/o inciting event. Started on cefepime and vanc in ED. X-ray show soft tissue swelling. Two of the toes look husky on exam. Concern for osteomyelitis or gangrene. - MRI left foot and ankle - F/u blood cultures - C/w vanc, cefepime per pharmacy - Tylenol PRN for fever - Trend CBCs - May need ortho consult if deeper infection - Dilaudid 0.5mg  q3hr PRN for severe pain  Anemia 2/2 menorrhagia Admit hgb 6.8. Basline  hgb 8-10. Platelets 339. Prior Iron and ferritin on 01/2018 WNL.  - Start 1 unit pRBC - Trend CBCs - F/u Iron & TIBC  Acute on Chronic Kidney Disease 2/2 dehydration vs progression of CKD Admit creatinin 6.74. Baseline creatinine ~3.9 Was on sodium bicarb 650mg  but bicarb on admit 13. - Maintenance fluid LR 125cc/hr  - Hold nephrotoxic meds - Consider consulting ID for HIV  med rec as her meds may need to be changed in setting of worsening renal function if it does not recover - C/w home meds: sodium bicarb tab 650mg  BID - F/u CK (had hgb w/o RBCs on UA) - Trend BMPs  HIV CD4 450 on 12/2017 - C/w home meds: tivicay 50mg  daily, descovy 200-25mg  daily  T2DM Hgb A1c 7.9. Admit bg 152 - Lantus 8 units qhs - Glucose checks - SSI  GERD - c/w home med: pantoprazole 40mg  daily  DVT prophx: SCDs Diet: Diabetic Bowel: Miralax Code: Full  Dispo: Admit patient to Inpatient with expected length of stay greater than 2 midnights.  Signed: Mosetta Anis, MD 07/03/2018, 1:57 AM  Pager: [phone]

## 2018-07-03 NOTE — Progress Notes (Signed)
   Subjective:  No acute events overnight.  Patient somnolent when seen this morning and complaining of left foot pain. She was being transported to MRI when seen during rounds.   Objective:  Vital signs in last 24 hours: Vitals:   07/03/18 0445 07/03/18 0515 07/03/18 0830 07/03/18 1332  BP: (!) 151/75 (!) 149/80 (!) 184/87 (!) 179/80  Pulse: 100 95 91 89  Resp: 14 14 16 17   Temp: 99.7 F (37.6 C) 99.5 F (37.5 C) 98 F (36.7 C) 98.8 F (37.1 C)  TempSrc: Oral Oral Oral Oral  SpO2: 100% 100% 100% 100%  Weight:      Height:       General: Young female, sitting in bed, somnolent and in pain but in no acute distress Ext:  L foot swollen, warm, and exquisitely TTP. Skin changes as below, worse on 4th toe. Small laceration noted on lateral aspect of 4th toe.      Assessment/Plan:  Principal Problem:   Osteomyelitis of foot, left, acute ([place]) Active Problems:   AKI (acute kidney injury) ([place])  Mrs.Pensyl is 42 yo F w/ PMH of HIV (CD4 450 12/2017) on descovy and tivicay, T2DM, htn, GERD, and CKD3 presenting with sepsis 2/2 L foot cellulitis and found to have fourth toe osteomyelitis as well as acute on chronic renal failure and acute anemia in the setting of menorrhagia and  # Sepsis 2/2 L foot cellulitis and early 4th toe osteomyelitis: Afebrile since starting antibiotics and currently hemodynamically stable. MRI of L ankle and foot showed early changes of osteo on 4th toe. Will consult ortho and continue antibiotic regimen with vancomycin and cefepime pending evaluation. She does have diminished pulses on exam. ABIs and TBIs ordered.    # Non-oliguric AKI on CKD in the setting of dehydration and sepsis: Also high suspicion for progression of CKD given persistent increase for the past 3 years. Will continue LR at current rate and repeat BMP in the afternoon. Renal US normal. No indication for emergent dialysis, but should consider nephrology consult.   # AoC Anemia 2/2 in the setting  of sepsis and blood loss: Hgb 6.9--> 7.9 after 1 unit of blood.  Iron studies consistent with iron deficiency anemia.  Will start iron supplementation once infection has been treated.  We will continue to monitor blood counts transfuse if Hgb < 7.   # HIV: Follows up with Dr. Johnnye Sima. On Tivicay and Descovy, might need to consult ID during this admission for recommendation on regimen in the setting of worsening renal function. Will hold nephrotoxic medications.   # T2DM: A1c 7.9 01/2018 in the setting of anemia. Insulin dependent at home and compliant. Will continue reduce dose of Lantus at 8U QHS and SSI. CBGs at goal.   Dispo: Anticipated discharge in approximately 2-4 day(s).   Welford Roche, MD 07/03/2018, 1:37 PM Pager: [phone]

## 2018-07-03 NOTE — ED Provider Notes (Addendum)
I assumed care of this patient from Dr. Tomi Bamberger at 0000.  Please see their note for further details of Hx, PE.  Briefly patient is a 43 y.o. female who presented with left foot cellulitis with fever.  Not septic but will require admission for IV antibiotics.  Currently awaiting CBC and BMP.    CBC notable for leukocytosis as well as anemia with a hemoglobin of 6.8.  On record review 9 months ago during patient's prior admission, she had hemoglobins at 7.9 and 8.3.  However 6 months ago hemoglobin was 10.1.  Patient denies any hematemesis, hematochezia, or melena.  States that she is currently on her menstrual cycle and reports that is very heavy.  Metabolic panel also notable for worsening AKI.  Case discussed with internal medicine who admitted the patient for continued work-up and management.     Fatima Blank, MD 07/03/18 [phone]

## 2018-07-03 NOTE — Plan of Care (Signed)

## 2018-07-03 NOTE — Progress Notes (Signed)
Pharmacy Antibiotic Note  Kathy Frank is a 42 y.o. female admitted on 07/02/2018 with cellulitis.  Pharmacy has been consulted for Vancomycin/Cefepime dosing. WBC mildly elevated. Pt in acute on chronic renal failure. Current Scr 6.74 (baseline around 3-4). Hx HIV.   Plan: -Vancomycin 1500 mg IV x 1 already given, will check random vancomycin level 11/25 AM before ordering any further doses -Cefepime 1g IV q24h -Trend WBC, temp, renal function  -F/U infectious work-up -Drug levels as indicated   Height: 5\' 5"  (165.1 cm) Weight: 157 lb (71.2 kg) IBW/kg (Calculated) : 57  Temp (24hrs), Avg:101.1 F (38.4 C), Min:100.7 F (38.2 C), Max:101.4 F (38.6 C)  Recent Labs  Lab 07/02/18 2156 07/02/18 2330 07/02/18 2337  WBC  --  12.5*  --   CREATININE  --  6.74*  --   LATICACIDVEN 1.03  --  0.52    Estimated Creatinine Clearance: 10.9 mL/min (A) (by C-G formula based on SCr of 6.74 mg/dL (H)).    No Known Allergies  Kathy Frank 07/03/2018 1:09 AM

## 2018-07-04 ENCOUNTER — Inpatient Hospital Stay (HOSPITAL_COMMUNITY): Payer: Medicaid Other

## 2018-07-04 DIAGNOSIS — I739 Peripheral vascular disease, unspecified: Secondary | ICD-10-CM

## 2018-07-04 DIAGNOSIS — E1142 Type 2 diabetes mellitus with diabetic polyneuropathy: Secondary | ICD-10-CM

## 2018-07-04 DIAGNOSIS — M86172 Other acute osteomyelitis, left ankle and foot: Secondary | ICD-10-CM

## 2018-07-04 DIAGNOSIS — A419 Sepsis, unspecified organism: Principal | ICD-10-CM

## 2018-07-04 DIAGNOSIS — N179 Acute kidney failure, unspecified: Secondary | ICD-10-CM

## 2018-07-04 DIAGNOSIS — D508 Other iron deficiency anemias: Secondary | ICD-10-CM

## 2018-07-04 DIAGNOSIS — B20 Human immunodeficiency virus [HIV] disease: Secondary | ICD-10-CM

## 2018-07-04 LAB — RENAL FUNCTION PANEL
Albumin: 2.3 g/dL — ABNORMAL LOW (ref 3.5–5.0)
Anion gap: 9 (ref 5–15)
BUN: 43 mg/dL — ABNORMAL HIGH (ref 6–20)
CO2: 13 mmol/L — ABNORMAL LOW (ref 22–32)
Calcium: 8.3 mg/dL — ABNORMAL LOW (ref 8.9–10.3)
Chloride: 114 mmol/L — ABNORMAL HIGH (ref 98–111)
Creatinine, Ser: 5.98 mg/dL — ABNORMAL HIGH (ref 0.44–1.00)
GFR calc Af Amer: 9 mL/min — ABNORMAL LOW (ref >60)
GFR calc non Af Amer: 8 mL/min — ABNORMAL LOW (ref >60)
Glucose, Bld: 136 mg/dL — ABNORMAL HIGH (ref 70–99)
Phosphorus: 4.3 mg/dL (ref 2.5–4.6)
Potassium: 5 mmol/L (ref 3.5–5.1)
Sodium: 136 mmol/L (ref 135–145)

## 2018-07-04 LAB — CBC
HCT: 27.6 % — ABNORMAL LOW (ref 36.0–46.0)
Hemoglobin: 8.5 g/dL — ABNORMAL LOW (ref 12.0–15.0)
MCH: 29.5 pg (ref 26.0–34.0)
MCHC: 30.8 g/dL (ref 30.0–36.0)
MCV: 95.8 fL (ref 80.0–100.0)
Platelets: 386 10*3/uL (ref 150–400)
RBC: 2.88 MIL/uL — ABNORMAL LOW (ref 3.87–5.11)
RDW: 15 % (ref 11.5–15.5)
WBC: 13.7 10*3/uL — ABNORMAL HIGH (ref 4.0–10.5)
nRBC: 0.1 % (ref 0.0–0.2)

## 2018-07-04 LAB — VANCOMYCIN, RANDOM: Vancomycin Rm: 17

## 2018-07-04 LAB — GLUCOSE, CAPILLARY
Glucose-Capillary: 131 mg/dL — ABNORMAL HIGH (ref 70–99)
Glucose-Capillary: 110 mg/dL — ABNORMAL HIGH (ref 70–99)
Glucose-Capillary: 232 mg/dL — ABNORMAL HIGH (ref 70–99)
Glucose-Capillary: 177 mg/dL — ABNORMAL HIGH (ref 70–99)

## 2018-07-04 MED ORDER — LABETALOL HCL 100 MG PO TABS
100.0000 mg | ORAL_TABLET | Freq: Two times a day (BID) | ORAL | Status: DC
  Administered 2018-07-04 – 2018-07-08 (×10): 100 mg via ORAL
  Filled 2018-07-04 (×10): qty 1

## 2018-07-04 MED ORDER — DOXYCYCLINE HYCLATE 100 MG PO TABS
100.0000 mg | ORAL_TABLET | Freq: Two times a day (BID) | ORAL | Status: DC
  Administered 2018-07-04 – 2018-07-07 (×7): 100 mg via ORAL
  Filled 2018-07-04 (×7): qty 1

## 2018-07-04 MED ORDER — SODIUM CHLORIDE 0.9 % IV SOLN
3.0000 g | Freq: Two times a day (BID) | INTRAVENOUS | Status: AC
  Administered 2018-07-04 – 2018-07-11 (×13): 3 g via INTRAVENOUS
  Filled 2018-07-04 (×14): qty 3

## 2018-07-04 MED ORDER — HYDROMORPHONE HCL 1 MG/ML IJ SOLN
1.0000 mg | INTRAMUSCULAR | Status: DC | PRN
  Administered 2018-07-04 – 2018-07-08 (×14): 1 mg via INTRAVENOUS
  Filled 2018-07-04 (×15): qty 1

## 2018-07-04 MED ORDER — SODIUM CHLORIDE 0.9 % IV SOLN
INTRAVENOUS | Status: DC
  Administered 2018-07-04 – 2018-07-05 (×2): via INTRAVENOUS

## 2018-07-04 MED ORDER — VANCOMYCIN HCL IN DEXTROSE 1-5 GM/200ML-% IV SOLN: 1000.0000 mg | INTRAVENOUS | Status: DC

## 2018-07-04 MED ORDER — LABETALOL HCL 5 MG/ML IV SOLN
10.0000 mg | Freq: Four times a day (QID) | INTRAVENOUS | Status: DC | PRN
  Filled 2018-07-04: qty 4

## 2018-07-04 MED ORDER — AMLODIPINE BESYLATE 10 MG PO TABS
10.0000 mg | ORAL_TABLET | Freq: Every day | ORAL | Status: DC
  Administered 2018-07-04 – 2018-07-11 (×8): 10 mg via ORAL
  Filled 2018-07-04 (×8): qty 1

## 2018-07-04 MED ORDER — PROCHLORPERAZINE EDISYLATE 10 MG/2ML IJ SOLN
10.0000 mg | INTRAMUSCULAR | Status: DC | PRN
  Administered 2018-07-04: 10 mg via INTRAVENOUS
  Filled 2018-07-04 (×2): qty 2

## 2018-07-04 MED ORDER — SODIUM BICARBONATE 650 MG PO TABS
1300.0000 mg | ORAL_TABLET | Freq: Two times a day (BID) | ORAL | Status: DC
  Administered 2018-07-04 – 2018-07-11 (×13): 1300 mg via ORAL
  Filled 2018-07-04 (×13): qty 2

## 2018-07-04 NOTE — Consult Note (Addendum)
Kathy Frank Admit Date: 07/02/2018 07/04/2018 Kathy Frank Requesting Physician:  Kathy Beams MD  Reason for Consult:  Renal Failure  HPI:  42 year old female admitted 2 days ago with fevers, pain in the left foot and found to have a toe ulcer with evidence of osteomyelitis of the fourth foot.  PMH Incudes:  Diabetes mellitus since a teenager, on insulin  HIV, controlled, follows with [facility]  Hypertension on amlodipine  Patient being seen by orthopedics.  She was seen by Kathy Frank in our office in 2017 when her creatinine was 1.7 with a urinary protein to creatinine ratio of 3.7.  Testing for lupus, ANCA vasculitis, hypocomplementemia was negative at that time.  She was admitted with serum creatinine as charted below.  It has marginally improved since admission.  She has a metabolic acidosis with normal anion gap.  Renal ultrasound performed yesterday was negative for acute/structural issues.  Patient denies use of nonsteroidals at home.  No recent antibiotics prior to admission.  Urine analysis with proteinuria, no hematuria.  HIV regimen does include tenofovir air.  Infectious diseases following with intent to change her regimen.  Patient has little knowledge about the status of her kidney disease.  It appears that she has had progressive renal failure over the past several years.  Currently she denies loss of appetite, change in taste, nausea/vomiting, hiccups, itching present prior to admission   Creat (mg/dL)  Date Value  05/27/2016 1.68 (H)  11/14/2014 0.92  11/24/2013 1.03   Creatinine, Ser (mg/dL)  Date Value  07/04/2018 5.98 (H)  07/03/2018 5.75 (H)  07/03/2018 6.44 (H)  07/02/2018 6.74 (H)  12/23/2017 3.92 (H)  09/14/2017 3.19 (H)  09/13/2017 3.89 (H)  09/13/2017 3.96 (H)  09/12/2017 3.87 (H)  03/14/2017 2.52 (H)  ] I/Os: I/O last 3 completed shifts: In: 2268.8 [P.O.:480; I.V.:1688.8; IV Piggyback:100] Out: 2200 [Urine:2200]   ROS NSAIDS: Denies  use IV Contrast no exposure TMP/SMX no exposure Hypotension not present Balance of 12 systems is negative w/ exceptions as above  PMH  Past Medical History:  Diagnosis Date  . CKD (chronic kidney disease) stage 3, GFR 30-59 ml/min (HCC) 06/19/2016   06/30/16: Seen by Kathy Frank [Nephrology]. Suspect etiologies to include diabetes and hypertension though cannot exclude HIV-associated nephropathy. Advised her against from using excessive Goody powder.  . CKD (chronic kidney disease), stage III ([place])   . Contact dermatitis 04/21/2016   07/29/16: Skin biopsy performed by [facility] notable for acute spongiotic dermatitis consistent with contact dermatitis.  . Cyclical vomiting    THC induced???  . Depression   . Depression 06/28/2006   Qualifier: Diagnosis of  By: Kathy Dubin MD, Kathy Frank    . Diabetes mellitus type 2 in obese ([place]) 06/28/1994  . DKA (diabetic ketoacidoses) ([place]) 12/2014  . Erosive esophagitis   . Esophageal reflux   . Essential hypertension 11/16/2013  . Gastroparesis    ? diabetic  . GERD (gastroesophageal reflux disease)   . Human immunodeficiency virus (HIV) disease ([place]) dx'd 04/2016  . Human immunodeficiency virus (HIV) disease ([place]) 04/23/2016  . Hypertension   . Moderate nonproliferative diabetic retinopathy of both eyes ([place]) 11/21/2014   11/14/14: Noted on retinal imaging; needs follow-up imaging in 6 months  05/22/16: Noted on retinal imaging again; needs follow-up imaging in 6 months  . Normocytic anemia   . TOBACCO USER 06/28/2006   Qualifier: Diagnosis of  By: Kathy Dubin MD, Kathy Frank    . Type II diabetes mellitus ([place]) 1994  diagnosed around 73   [name]  Past Surgical History:  Procedure Laterality Date  . TUBAL LIGATION  2002   FH  Family History  Problem Relation Age of Onset  . Diabetes Mother   . Diabetes Brother   . Diabetes Daughter   . Diabetes Daughter   . Mental retardation Brother        died from PNA  . Diabetes Maternal Grandmother     SH  reports that she quit smoking about 3 years ago. Her smoking use included cigarettes. She started smoking about 4 years ago. She has a 1.00 pack-year smoking history. She has never used smokeless tobacco. She reports that she drinks alcohol. She reports that she has current or past drug history. Drug: Marijuana. Frequency: 4.00 times per week. Allergies No Known Allergies Home medications Prior to Admission medications   Medication Sig Start Date End Date Taking? Authorizing Provider  amLODipine (NORVASC) 10 MG tablet Take 1 tablet (10 mg total) by mouth daily. 12/24/17 07/02/18 Yes Rice, Resa Miner, MD  citalopram (CELEXA) 20 MG tablet Take 1 tablet (20 mg total) by mouth daily. 12/24/17 12/24/18 Yes Rice, Resa Miner, MD  DESCOVY 200-25 MG tablet TAKE ONE TABLET BY MOUTH EVERY DAY. Patient taking differently: Take 1 tablet by mouth daily.  02/28/18  Yes Kathy Riches, MD  furosemide (LASIX) 40 MG tablet Take 1 tablet (40 mg total) by mouth daily. 12/24/17 07/02/18 Yes Rice, Resa Miner, MD  glucose blood (ACCU-CHEK GUIDE) test strip USE TO TEST BLOOD SUGAR 3 TIMES DAILY . Diagnosis code  E11.69, E66.9 07/09/17  Yes Chundi, Vahini, MD  Insulin Glargine (LANTUS) 100 UNIT/ML Solostar Pen Inject 15 Units into the skin daily at 10 pm. Patient taking differently: Inject 15 Units into the skin every morning.  12/24/17  Yes Rice, Resa Miner, MD  labetalol (NORMODYNE) 100 MG tablet Take 1 tablet (100 mg total) by mouth 2 (two) times daily. 02/04/18 07/02/18 Yes Chundi, Vahini, MD  liraglutide (VICTOZA) 18 MG/3ML SOPN Inject 0.2 mLs (1.2 mg total) into the skin daily. 02/04/18  Yes Chundi, Verne Spurr, MD  ondansetron (ZOFRAN ODT) 4 MG disintegrating tablet Take 1 tablet (4 mg total) by mouth every 8 (eight) hours as needed for nausea or vomiting. 08/06/17  Yes Kathy Frank  sennosides-docusate sodium (SENOKOT-S) 8.6-50 MG tablet Take 1 tablet by mouth daily as needed for constipation.  12/24/17  Yes Rice, Resa Miner, MD  TIVICAY 50 MG tablet TAKE ONE TABLET BY MOUTH EVERY DAY Patient taking differently: Take 50 mg by mouth daily.  02/28/18  Yes Kathy Riches, MD  diclofenac sodium (VOLTAREN) 1 % GEL Apply 2 g topically 4 (four) times daily. Patient not taking: Reported on 07/02/2018 12/24/17   Collier Salina, MD  dolutegravir (TIVICAY) 50 MG tablet TAKE ONE (1) TABLET BY MOUTH EVERY DAY Patient not taking: Reported on 07/02/2018 12/24/17   Collier Salina, MD  emtricitabine-tenofovir AF (DESCOVY) 200-25 MG tablet TAKE ONE (1) TABLET BY MOUTH EVERY DAY Patient not taking: Reported on 07/02/2018 12/24/17   Collier Salina, MD  metoCLOPramide (REGLAN) 5 MG tablet Take 1 tablet (5 mg total) by mouth 3 (three) times daily before meals. Patient not taking: Reported on 07/02/2018 12/24/17   Collier Salina, MD  Multiple Vitamin (MULTIVITAMIN WITH MINERALS) TABS tablet Take 1 tablet by mouth daily. Patient not taking: Reported on 07/02/2018 12/24/17   Collier Salina, MD  pantoprazole (PROTONIX) 40 MG tablet Take 1 tablet (40  mg total) by mouth daily. Patient not taking: Reported on 07/02/2018 12/24/17   Collier Salina, MD  sodium bicarbonate 650 MG tablet Take 1 tablet (650 mg total) by mouth 2 (two) times daily. Patient not taking: Reported on 07/02/2018 12/24/17   Collier Salina, MD  triamcinolone cream (KENALOG) 0.1 % Apply 1 application topically 2 (two) times daily. Patient not taking: Reported on 07/02/2018 02/04/18   Lars Mage, MD    Current Medications Scheduled Meds: . amLODipine  10 mg Oral Daily  . citalopram  20 mg Oral Daily  . dolutegravir  50 mg Oral Daily  . doxycycline  100 mg Oral Q12H  . emtricitabine-tenofovir AF  1 tablet Oral Daily  . insulin aspart  0-9 Units Subcutaneous TID WC  . insulin glargine  8 Units Subcutaneous QHS  . labetalol  100 mg Oral BID  . multivitamin with minerals  1 tablet Oral Daily  .  pantoprazole  40 mg Oral Daily  . sodium bicarbonate  650 mg Oral BID   Continuous Infusions: . sodium chloride 75 mL/hr at 07/04/18 1110  . ampicillin-sulbactam (UNASYN) IV 3 g (07/04/18 1323)   PRN Meds:.acetaminophen **OR** acetaminophen, HYDROmorphone (DILAUDID) injection, labetalol, polyethylene glycol, prochlorperazine  CBC Recent Labs  Lab 07/02/18 2330 07/03/18 0843 07/04/18 0608  WBC 12.5*  --  13.7*  NEUTROABS 10.2*  --   --   HGB 6.8* 7.9* 8.5*  HCT 22.7* 25.5* 27.6*  MCV 101.8*  --  95.8  PLT 339  --  841   Basic Metabolic Panel Recent Labs  Lab 07/02/18 2330 07/03/18 0843 07/03/18 1505 07/04/18 0608  NA 135 134* 134* 136  K 4.7 4.6 5.1 5.0  CL 114* 114* 112* 114*  CO2 13* 12* 11* 13*  GLUCOSE 152* 155* 167* 136*  BUN 47* 46* 45* 43*  CREATININE 6.74* 6.44* 5.75* 5.98*  CALCIUM 7.7* 7.9* 8.1* 8.3*  PHOS  --  4.7*  --  4.3    Physical Exam  Blood pressure 133/72, pulse 88, temperature 98.7 F (37.1 C), temperature source Oral, resp. rate 20, height 5\' 5"  (1.651 m), weight 71.2 kg, last menstrual period 06/27/2018, SpO2 100 %. GEN: No acute distress, tearful ENT: NCAT EYES: EOMI CV: RRR, 2 out of 6 midsystolic murmur best at left lower sternal border PULM: Clear bilaterally, normal work of breathing ABD: Soft, nontender SKIN: Left fourth digit ulcer evaluated EXT: Trace lower extremity edema   Assessment 42 year old female with renal failure, favor progressive CKD over acute on chronic renal insufficiency, normal anion gap metabolic acidosis, HIV on regimen including tenofovir her, presenting with ulcer of left fourth toe and underlying osteomyelitis  1. Renal failure, likely progressive CKD, now stage 5; not overtly uremic 2. Osteomyelitis of left fourth toe 3. Diabetes mellitus 4. HIV, on ARV's including tenofovir or 5. Normal anion gap metabolic acidosis; likely from tenofovir or and impaired ammoniagenesis related to CKD 6. Hypertension on  amlodipine, labetalol 7. Anemia, iron deficient with iron saturation of 3%, also likely related to #1 and #2  Plan 1. No immediate RRT indicated but will need to initiate education and hopefully she will be able to closely follow with [place] 2. Agree with finding a regimen for HIV not including tenofovir or as it is associated with proximal RTA 3. Increase sodium bicarbonate to 1300 mg twice daily 4. Hold iron with active infection 5. Daily weights, Daily Renal Panel, Strict I/Os, Avoid nephrotoxins (NSAIDs, judicious IV Contrast) 6. We will follow  closely   Pearson Grippe MD [phone] pgr 07/04/2018, 2:04 PM

## 2018-07-04 NOTE — Consult Note (Signed)
ORTHOPAEDIC CONSULTATION  REQUESTING PHYSICIAN: Annia Belt, MD  Chief Complaint: Ulcerations left foot  HPI: Kathy Frank is a 42 y.o. female who presents with diabetic insensate neuropathy with severe protein caloric malnutrition with peripheral vascular disease involving the left lower extremity who presents with blistering ulcers and ischemic changes to the left forefoot.  Past Medical History:  Diagnosis Date  . CKD (chronic kidney disease) stage 3, GFR 30-59 ml/min (HCC) 06/19/2016   06/30/16: Seen by Dr. Posey Pronto [Nephrology]. Suspect etiologies to include diabetes and hypertension though cannot exclude HIV-associated nephropathy. Advised her against from using excessive Goody powder.  . CKD (chronic kidney disease), stage III ([place])   . Contact dermatitis 04/21/2016   07/29/16: Skin biopsy performed by [facility] notable for acute spongiotic dermatitis consistent with contact dermatitis.  . Cyclical vomiting    THC induced???  . Depression   . Depression 06/28/2006   Qualifier: Diagnosis of  By: Riccardo Dubin MD, Sherren Mocha    . Diabetes mellitus type 2 in obese ([place]) 06/28/1994  . DKA (diabetic ketoacidoses) ([place]) 12/2014  . Erosive esophagitis   . Esophageal reflux   . Essential hypertension 11/16/2013  . Gastroparesis    ? diabetic  . GERD (gastroesophageal reflux disease)   . Human immunodeficiency virus (HIV) disease ([place]) dx'd 04/2016  . Human immunodeficiency virus (HIV) disease ([place]) 04/23/2016  . Hypertension   . Moderate nonproliferative diabetic retinopathy of both eyes ([place]) 11/21/2014   11/14/14: Noted on retinal imaging; needs follow-up imaging in 6 months  05/22/16: Noted on retinal imaging again; needs follow-up imaging in 6 months  . Normocytic anemia   . TOBACCO USER 06/28/2006   Qualifier: Diagnosis of  By: Riccardo Dubin MD, Sherren Mocha    . Type II diabetes mellitus ([place]) 1994   diagnosed around 1994   Past Surgical History:  Procedure Laterality Date   . TUBAL LIGATION  2002   Social History   Socioeconomic History  . Marital status: Single    Spouse name: Not on file  . Number of children: Not on file  . Years of education: 69  . Highest education level: Not on file  Occupational History    Employer: UNEMPLOYED  Social Needs  . Financial resource strain: Not on file  . Food insecurity:    Worry: Not on file    Inability: Not on file  . Transportation needs:    Medical: Not on file    Non-medical: Not on file  Tobacco Use  . Smoking status: Former Smoker    Packs/day: 0.10    Years: 10.00    Pack years: 1.00    Types: Cigarettes    Start date: 11/17/2013    Last attempt to quit: 09/09/2014    Years since quitting: 3.8  . Smokeless tobacco: Never Used  Substance and Sexual Activity  . Alcohol use: Yes    Comment: Occasionally.  . Drug use: Yes    Frequency: 4.0 times per week    Types: Marijuana  . Sexual activity: Not on file  Lifestyle  . Physical activity:    Days per week: Not on file    Minutes per session: Not on file  . Stress: Not on file  Relationships  . Social connections:    Talks on phone: Not on file    Gets together: Not on file    Attends religious service: Not on file    Active member of club or organization: Not on file  Attends meetings of clubs or organizations: Not on file    Relationship status: Not on file  Other Topics Concern  . Not on file  Social History Narrative  . Not on file   Family History  Problem Relation Age of Onset  . Diabetes Mother   . Diabetes Brother   . Diabetes Daughter   . Diabetes Daughter   . Mental retardation Brother        died from PNA  . Diabetes Maternal Grandmother    - negative except otherwise stated in the family history section No Known Allergies Prior to Admission medications   Medication Sig Start Date End Date Taking? Authorizing Provider  amLODipine (NORVASC) 10 MG tablet Take 1 tablet (10 mg total) by mouth daily. 12/24/17 07/02/18  Yes Rice, Resa Miner, MD  citalopram (CELEXA) 20 MG tablet Take 1 tablet (20 mg total) by mouth daily. 12/24/17 12/24/18 Yes Rice, Resa Miner, MD  DESCOVY 200-25 MG tablet TAKE ONE TABLET BY MOUTH EVERY DAY. Patient taking differently: Take 1 tablet by mouth daily.  02/28/18  Yes Campbell Riches, MD  furosemide (LASIX) 40 MG tablet Take 1 tablet (40 mg total) by mouth daily. 12/24/17 07/02/18 Yes Rice, Resa Miner, MD  glucose blood (ACCU-CHEK GUIDE) test strip USE TO TEST BLOOD SUGAR 3 TIMES DAILY . Diagnosis code  E11.69, E66.9 07/09/17  Yes Chundi, Vahini, MD  Insulin Glargine (LANTUS) 100 UNIT/ML Solostar Pen Inject 15 Units into the skin daily at 10 pm. Patient taking differently: Inject 15 Units into the skin every morning.  12/24/17  Yes Rice, Resa Miner, MD  labetalol (NORMODYNE) 100 MG tablet Take 1 tablet (100 mg total) by mouth 2 (two) times daily. 02/04/18 07/02/18 Yes Chundi, Vahini, MD  liraglutide (VICTOZA) 18 MG/3ML SOPN Inject 0.2 mLs (1.2 mg total) into the skin daily. 02/04/18  Yes Chundi, Verne Spurr, MD  ondansetron (ZOFRAN ODT) 4 MG disintegrating tablet Take 1 tablet (4 mg total) by mouth every 8 (eight) hours as needed for nausea or vomiting. 08/06/17  Yes Jule Ser, DO  sennosides-docusate sodium (SENOKOT-S) 8.6-50 MG tablet Take 1 tablet by mouth daily as needed for constipation. 12/24/17  Yes Rice, Resa Miner, MD  TIVICAY 50 MG tablet TAKE ONE TABLET BY MOUTH EVERY DAY Patient taking differently: Take 50 mg by mouth daily.  02/28/18  Yes Campbell Riches, MD  diclofenac sodium (VOLTAREN) 1 % GEL Apply 2 g topically 4 (four) times daily. Patient not taking: Reported on 07/02/2018 12/24/17   Collier Salina, MD  dolutegravir (TIVICAY) 50 MG tablet TAKE ONE (1) TABLET BY MOUTH EVERY DAY Patient not taking: Reported on 07/02/2018 12/24/17   Collier Salina, MD  emtricitabine-tenofovir AF (DESCOVY) 200-25 MG tablet TAKE ONE (1) TABLET BY MOUTH EVERY  DAY Patient not taking: Reported on 07/02/2018 12/24/17   Collier Salina, MD  metoCLOPramide (REGLAN) 5 MG tablet Take 1 tablet (5 mg total) by mouth 3 (three) times daily before meals. Patient not taking: Reported on 07/02/2018 12/24/17   Collier Salina, MD  Multiple Vitamin (MULTIVITAMIN WITH MINERALS) TABS tablet Take 1 tablet by mouth daily. Patient not taking: Reported on 07/02/2018 12/24/17   Collier Salina, MD  pantoprazole (PROTONIX) 40 MG tablet Take 1 tablet (40 mg total) by mouth daily. Patient not taking: Reported on 07/02/2018 12/24/17   Collier Salina, MD  sodium bicarbonate 650 MG tablet Take 1 tablet (650 mg total) by mouth 2 (two) times daily. Patient not taking:  Reported on 07/02/2018 12/24/17   Collier Salina, MD  triamcinolone cream (KENALOG) 0.1 % Apply 1 application topically 2 (two) times daily. Patient not taking: Reported on 07/02/2018 02/04/18   Lars Mage, MD   Dg Chest 2 View  Result Date: 07/02/2018 CLINICAL DATA:  Sepsis, HTN, DM, ex-smoker EXAM: CHEST - 2 VIEW COMPARISON:  Chest x-ray dated 09/12/2017. FINDINGS: Heart size and mediastinal contours are within normal limits, stable. Lungs are clear. No pleural effusion or pneumothorax seen. Osseous structures about the chest are unremarkable. IMPRESSION: No active cardiopulmonary disease. No evidence of pneumonia or pulmonary edema. Electronically Signed   By: Franki Cabot M.D.   On: 07/02/2018 23:03   US Renal  Result Date: 07/03/2018 CLINICAL DATA:  Acute kidney injury EXAM: RENAL / URINARY TRACT ULTRASOUND COMPLETE COMPARISON:  None. FINDINGS: Right Kidney: Renal measurements: 10.2 x 4.9 x 5.7 cm = volume: 148 mL . Echogenicity within normal limits. No mass or hydronephrosis visualized. Left Kidney: Renal measurements: 9.2 x 5.2 x 4.3 cm = volume: 106 mL. RIGHT kidney incompletely visualized. Echogenicity within normal limits. No mass or hydronephrosis visualized. Bladder: Appears normal  for degree of bladder distention. IMPRESSION: No acute findings.  No hydronephrosis. Electronically Signed   By: Franki Cabot M.D.   On: 07/03/2018 02:18   Mr Foot Left Wo Contrast  Result Date: 07/03/2018 CLINICAL DATA:  Left foot pain and fever. Cellulitis. Osteomyelitis suspected. Immunocompromised patient (HIV infection, diabetes and chronic kidney disease). EXAM: MRI OF THE LEFT FOOT WITHOUT CONTRAST TECHNIQUE: Multiplanar, multisequence MR imaging of the left forefoot was performed. No intravenous contrast was administered. COMPARISON:  Left foot radiographs 07/02/2018. FINDINGS: Ankle findings are dictated separately. Bones/Joint/Cartilage There is abnormal low T1 signal surrounding the middle and distal phalanges of the 4th toe, best seen on series 5 and 6. There is no definite corresponding abnormality on yesterday's radiographs, but this is suspicious for soft tissue emphysema. There is underlying mild marrow edema throughout the phalanges of the 4th toe. There is some decreased T1 signal in the middle and distal phalanges. No cortical destruction identified. No significant joint effusion. The additional toes appear normal. The metatarsophalangeal joints appear normal. There are mild degenerative changes at the Lisfranc joint with subchondral cyst formation in the middle cuneiform and metatarsal bases. Ligaments Intact Lisfranc ligament. 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 No significant tenosynovitis. Soft tissues The 4th toe is swollen with diffusely decreased T1 signal throughout the subcutaneous fat. There are possible pressure lesions plantar to the 4th and 5th metatarsal heads. No abnormality of the adjacent metatarsal-phalangeal joints is seen. There is generalized edema throughout the subcutaneous fat, greatest dorsally. No focal fluid collection. IMPRESSION: 1. Swelling of the 4th toe with suspected soft  tissue emphysema and marrow changes suspicious for early osteomyelitis. 2. Generalized forefoot cellulitis without focal fluid collection. 3. Midfoot degenerative changes. 4. Ankle findings dictated separately. Electronically Signed   By: Richardean Sale M.D.   On: 07/03/2018 12:45   Mr Ankle Left Wo Contrast  Result Date: 07/03/2018 CLINICAL DATA:  Left foot pain and fever. Cellulitis. Osteomyelitis suspected. Immunocompromised patient (HIV infection, diabetes and chronic kidney disease). EXAM: MRI OF THE LEFT ANKLE WITHOUT CONTRAST TECHNIQUE: Multiplanar, multisequence MR imaging of the ankle was performed. No intravenous contrast was administered. COMPARISON:  Foot radiographs 07/02/2018. FINDINGS: Forefoot findings dictated separately. TENDONS Peroneal: Intact and normally positioned. Posteromedial: Intact and normally positioned. Anterior: Intact and normally positioned.  Achilles: Intact. Plantar Fascia: Intact. LIGAMENTS Lateral: Not optimally evaluated, although grossly intact. Medial: The deltoid and visualized portions of the spring ligament appear intact. CARTILAGE AND BONES Ankle Joint: Small ankle joint effusion. The talar dome and tibial plafond appear normal. Subtalar Joints/Sinus Tarsi: Small subtalar joint effusion and mild edema in the tarsal sinus. Bones: Patchy T2 hyperintensity throughout the tarsal bones, likely reactive. There are degenerative changes at the Lisfranc joint, with subchondral cysts in the middle cuneiform. No evidence of osteomyelitis at the ankle, hindfoot or midfoot. Other: Generalized subcutaneous edema, greatest laterally. No focal fluid collection or skin ulceration identified. IMPRESSION: 1. Forefoot findings dictated separately. 2. No evidence of osteomyelitis within the left ankle, hindfoot or forefoot. 3. Generalized soft tissue edema, greatest laterally, likely cellulitis. No evidence of soft tissue abscess. 4. Intact ankle tendons and ligaments. Midfoot  degenerative changes. Electronically Signed   By: Richardean Sale M.D.   On: 07/03/2018 12:44   Dg Foot Complete Left  Result Date: 07/02/2018 CLINICAL DATA:  Left foot pain and redness. EXAM: LEFT FOOT - COMPLETE 3+ VIEW COMPARISON:  None. FINDINGS: There is no evidence of fracture or dislocation. There is no evidence of arthropathy or other focal bone abnormality. Mild dorsal soft tissue swelling. IMPRESSION: No acute osseous injury of the left foot. Mild dorsal soft tissue swelling. Electronically Signed   By: Ulyses Jarred M.D.   On: 07/02/2018 23:06   - pertinent xrays, CT, MRI studies were reviewed and independently interpreted  Positive ROS: All other systems have been reviewed and were otherwise negative with the exception of those mentioned in the HPI and as above.  Physical Exam: General: Alert, no acute distress Psychiatric: Patient is competent for consent with normal mood and affect Lymphatic: No axillary or cervical lymphadenopathy Cardiovascular: No pedal edema Respiratory: No cyanosis, no use of accessory musculature GI: No organomegaly, abdomen is soft and non-tender    Images:  @ENCIMAGES @  Labs:  Lab Results  Component Value Date   HGBA1C 7.9 (A) 02/04/2018   HGBA1C 9.0 (H) 09/13/2017   HGBA1C 7.5 09/29/2016   ESRSEDRATE 85 (H) 02/02/2017   CRP 42.4 (H) 02/02/2017   REPTSTATUS PENDING 07/02/2018   CULT  07/02/2018    NO GROWTH 2 DAYS Performed at [facility], [place] [address] [place], [place] [address]     Lab Results  Component Value Date   ALBUMIN 2.3 (L) 07/04/2018   ALBUMIN 2.2 (L) 07/03/2018   ALBUMIN 2.4 (L) 07/02/2018    Neurologic: Patient does not have protective sensation bilateral lower extremities.   MUSCULOSKELETAL:   Skin: Examination patient has ischemic changes to the left forefoot with blistering over the fourth toe.  Patient does not have a palpable dorsalis pedis pulse on the left she has a strong dorsalis pedis pulse  on the right.  Patient has ischemic changes with decreased temperature to the forefoot on the left with blistering over the fourth toe.  MRI scan shows bony changes consistent with osteomyelitis of the fourth toe.  Preliminary ankle-brachial indices on the left shows severe arterial insufficiency.  Assessment: Assessment: Diabetic insensate neuropathy with severe protein caloric malnutrition with severe peripheral vascular disease to the left lower extremity with ischemic changes and osteomyelitis of the left forefoot.  Plan: Plan: I will consult vascular vein surgery, patient may require an arteriogram study of the left lower extremity.  No surgical intervention to the left lower extremity until after her vascular intervention.  Thank you for the consult  and the opportunity to see Kathy Frank, [place] [phone] 4:54 PM

## 2018-07-04 NOTE — Consult Note (Addendum)
Reason for Consult:Toe osteo Referring Physician: Annye Rusk  Kathy Frank is an 42 y.o. female.  HPI: Kathy Frank was in her usual state of health until 7-10d ago when she developed some left foot pain. She initially blamed in on some new steel toed shoes she was wearing for work. About 5d ago the pain increased and her foot and 4th toe began to swell. By Saturday it was too painful to bear and she came to the ED and was admitted by IM. Workup over the weekend included a left foot MRI that showed likely osteo in the 4th toe and orthopedic surgery was consulted. She recently had a wound on her 2nd toe but this healed with conservative measures. She works in a TEFL teacher bus seats.  Past Medical History:  Diagnosis Date  . CKD (chronic kidney disease) stage 3, GFR 30-59 ml/min (HCC) 06/19/2016   06/30/16: Seen by Dr. Posey Pronto [Nephrology]. Suspect etiologies to include diabetes and hypertension though cannot exclude HIV-associated nephropathy. Advised her against from using excessive Goody powder.  . CKD (chronic kidney disease), stage III ([place])   . Contact dermatitis 04/21/2016   07/29/16: Skin biopsy performed by [facility] notable for acute spongiotic dermatitis consistent with contact dermatitis.  . Cyclical vomiting    THC induced???  . Depression   . Depression 06/28/2006   Qualifier: Diagnosis of  By: Riccardo Dubin MD, Sherren Mocha    . Diabetes mellitus type 2 in obese ([place]) 06/28/1994  . DKA (diabetic ketoacidoses) ([place]) 12/2014  . Erosive esophagitis   . Esophageal reflux   . Essential hypertension 11/16/2013  . Gastroparesis    ? diabetic  . GERD (gastroesophageal reflux disease)   . Human immunodeficiency virus (HIV) disease ([place]) dx'd 04/2016  . Human immunodeficiency virus (HIV) disease ([place]) 04/23/2016  . Hypertension   . Moderate nonproliferative diabetic retinopathy of both eyes ([place]) 11/21/2014   11/14/14: Noted on retinal imaging; needs follow-up imaging in 6 months  05/22/16:  Noted on retinal imaging again; needs follow-up imaging in 6 months  . Normocytic anemia   . TOBACCO USER 06/28/2006   Qualifier: Diagnosis of  By: Riccardo Dubin MD, Sherren Mocha    . Type II diabetes mellitus ([place]) 1994   diagnosed around 1994    Past Surgical History:  Procedure Laterality Date  . TUBAL LIGATION  2002    Family History  Problem Relation Age of Onset  . Diabetes Mother   . Diabetes Brother   . Diabetes Daughter   . Diabetes Daughter   . Mental retardation Brother        died from PNA  . Diabetes Maternal Grandmother     Social History:  reports that she quit smoking about 3 years ago. Her smoking use included cigarettes. She started smoking about 4 years ago. She has a 1.00 pack-year smoking history. She has never used smokeless tobacco. She reports that she drinks alcohol. She reports that she has current or past drug history. Drug: Marijuana. Frequency: 4.00 times per week.  Allergies: No Known Allergies  Medications: I have reviewed the patient's current medications.  Results for orders placed or performed during the hospital encounter of 07/02/18 (from the past 48 hour(s))  Blood Culture (routine x 2)     Status: None (Preliminary result)   Collection Time: 07/02/18  9:49 PM  Result Value Ref Range   Specimen Description BLOOD LEFT ANTECUBITAL    Special Requests      BOTTLES DRAWN AEROBIC AND ANAEROBIC Blood Culture  results may not be optimal due to an inadequate volume of blood received in culture bottles   Culture      NO GROWTH 2 DAYS Performed at [facility] [address], [place], [place] [address]    Report Status PENDING   I-Stat CG4 Lactic Acid, ED     Status: None   Collection Time: 07/02/18  9:56 PM  Result Value Ref Range   Lactic Acid, Venous 1.03 0.5 - 1.9 mmol/L  Urinalysis, Routine w reflex microscopic     Status: Abnormal   Collection Time: 07/02/18  9:58 PM  Result Value Ref Range   Color, Urine YELLOW YELLOW   APPearance HAZY (A)  CLEAR   Specific Gravity, Urine 1.013 1.005 - 1.030   pH 5.0 5.0 - 8.0   Glucose, UA 50 (A) NEGATIVE mg/dL   Hgb urine dipstick MODERATE (A) NEGATIVE   Bilirubin Urine NEGATIVE NEGATIVE   Ketones, ur NEGATIVE NEGATIVE mg/dL   Protein, ur 100 (A) NEGATIVE mg/dL   Nitrite NEGATIVE NEGATIVE   Leukocytes, UA NEGATIVE NEGATIVE   RBC / HPF 0-5 0 - 5 RBC/hpf   WBC, UA 0-5 0 - 5 WBC/hpf   Bacteria, UA RARE (A) NONE SEEN   Squamous Epithelial / LPF 0-5 0 - 5   Mucus PRESENT    Amorphous Crystal PRESENT     Comment: Performed at [facility], [address], [place], [place] [address]  CBC with Differential     Status: Abnormal   Collection Time: 07/02/18 11:30 PM  Result Value Ref Range   WBC 12.5 (H) 4.0 - 10.5 K/uL   RBC 2.23 (L) 3.87 - 5.11 MIL/uL   Hemoglobin 6.8 (LL) 12.0 - 15.0 g/dL    Comment: REPEATED TO VERIFY THIS CRITICAL RESULT HAS VERIFIED AND BEEN CALLED TO A. GORTON RN BY HANNAH MILES ON 11 24 2019 AT 0000, AND HAS BEEN READ BACK.     HCT 22.7 (L) 36.0 - 46.0 %   MCV 101.8 (H) 80.0 - 100.0 fL   MCH 30.5 26.0 - 34.0 pg   MCHC 30.0 30.0 - 36.0 g/dL   RDW 14.0 11.5 - 15.5 %   Platelets 339 150 - 400 K/uL   nRBC 0.0 0.0 - 0.2 %   Neutrophils Relative % 81 %   Neutro Abs 10.2 (H) 1.7 - 7.7 K/uL   Lymphocytes Relative 8 %   Lymphs Abs 1.0 0.7 - 4.0 K/uL   Monocytes Relative 8 %   Monocytes Absolute 1.0 0.1 - 1.0 K/uL   Eosinophils Relative 2 %   Eosinophils Absolute 0.2 0.0 - 0.5 K/uL   Basophils Relative 0 %   Basophils Absolute 0.1 0.0 - 0.1 K/uL   Immature Granulocytes 1 %   Abs Immature Granulocytes 0.08 (H) 0.00 - 0.07 K/uL    Comment: Performed at [facility] [address], [place], [place] [address]  Comprehensive metabolic panel     Status: Abnormal   Collection Time: 07/02/18 11:30 PM  Result Value Ref Range   Sodium 135 135 - 145 mmol/L   Potassium 4.7 3.5 - 5.1 mmol/L   Chloride 114 (H) 98 - 111 mmol/L   CO2 13 (L) 22 - 32 mmol/L    Glucose, Bld 152 (H) 70 - 99 mg/dL   BUN 47 (H) 6 - 20 mg/dL   Creatinine, Ser 6.74 (H) 0.44 - 1.00 mg/dL   Calcium 7.7 (L) 8.9 - 10.3 mg/dL  Total Protein 6.6 6.5 - 8.1 g/dL   Albumin 2.4 (L) 3.5 - 5.0 g/dL   AST 9 (L) 15 - 41 U/L   ALT 8 0 - 44 U/L   Alkaline Phosphatase 86 38 - 126 U/L   Total Bilirubin 0.2 (L) 0.3 - 1.2 mg/dL   GFR calc non Af Amer 7 (L) >60 mL/min   GFR calc Af Amer 8 (L) >60 mL/min    Comment: (NOTE) The eGFR has been calculated using the CKD EPI equation. This calculation has not been validated in all clinical situations. eGFR's persistently <60 mL/min signify possible Chronic Kidney Disease.    Anion gap 8 5 - 15    Comment: Performed at [facility] [address], [place], [place] [address]  Iron and TIBC     Status: Abnormal   Collection Time: 07/02/18 11:31 PM  Result Value Ref Range   Iron 7 (L) 28 - 170 ug/dL   TIBC 252 250 - 450 ug/dL   Saturation Ratios 3 (L) 10.4 - 31.8 %   UIBC 245 ug/dL    Comment: Performed at [facility], [place] [address], [place], [place] [address]  Protime-INR     Status: None   Collection Time: 07/02/18 11:31 PM  Result Value Ref Range   Prothrombin Time 15.0 11.4 - 15.2 seconds   INR 1.19     Comment: Performed at [facility] [address], [place], [place] [address]  APTT     Status: Abnormal   Collection Time: 07/02/18 11:31 PM  Result Value Ref Range   aPTT 40 (H) 24 - 36 seconds    Comment:        IF BASELINE aPTT IS ELEVATED, SUGGEST PATIENT RISK ASSESSMENT BE USED TO DETERMINE APPROPRIATE ANTICOAGULANT THERAPY. Performed at [facility], [place] [address] [place], [place] [address]   Blood Culture (routine x 2)     Status: None (Preliminary result)   Collection Time: 07/02/18 11:35 PM  Result Value Ref Range   Specimen Description BLOOD LEFT HAND    Special Requests      BOTTLES DRAWN AEROBIC AND ANAEROBIC Blood Culture adequate volume   Culture      NO GROWTH 2  DAYS Performed at [facility], [place] [address] [place], [place] [address]    Report Status PENDING   I-Stat CG4 Lactic Acid, ED     Status: None   Collection Time: 07/02/18 11:37 PM  Result Value Ref Range   Lactic Acid, Venous 0.52 0.5 - 1.9 mmol/L  Type and screen [place]     Status: None (Preliminary result)   Collection Time: 07/03/18 12:30 AM  Result Value Ref Range   ABO/RH(D) B POS    Antibody Screen NEG    Sample Expiration 07/06/2018    Unit Number [id number]    Blood Component Type RED CELLS,LR    Unit division 00    Status of Unit ALLOCATED    Transfusion Status OK TO TRANSFUSE    Crossmatch Result Compatible    Unit Number [id number]    Blood Component Type RED CELLS,LR    Unit division 00    Status of Unit ALLOCATED    Transfusion Status OK TO TRANSFUSE    Crossmatch Result Compatible    Unit Number [id number]    Blood Component Type RED CELLS,LR    Unit division 00    Status of Unit ISSUED,FINAL  Transfusion Status OK TO TRANSFUSE    Crossmatch Result      Compatible Performed at [facility], [place] [address] [place], [place] [address]   Prepare RBC     Status: None   Collection Time: 07/03/18 12:30 AM  Result Value Ref Range   Order Confirmation      ORDER PROCESSED BY BLOOD BANK Performed at [facility], [place] [address], [place], [place] [address]   ABO/Rh     Status: None   Collection Time: 07/03/18 12:30 AM  Result Value Ref Range   ABO/RH(D)      B POS Performed at [facility] [address] [place], [place] [address]   Glucose, capillary     Status: Abnormal   Collection Time: 07/03/18  2:52 AM  Result Value Ref Range   Glucose-Capillary 156 (H) 70 - 99 mg/dL   Comment 1 Notify RN   Glucose, capillary     Status: Abnormal   Collection Time: 07/03/18  6:37 AM  Result Value Ref Range   Glucose-Capillary 169 (H) 70 - 99 mg/dL   Comment 1 Notify RN   Renal function panel     Status:  Abnormal   Collection Time: 07/03/18  8:43 AM  Result Value Ref Range   Sodium 134 (L) 135 - 145 mmol/L   Potassium 4.6 3.5 - 5.1 mmol/L   Chloride 114 (H) 98 - 111 mmol/L   CO2 12 (L) 22 - 32 mmol/L   Glucose, Bld 155 (H) 70 - 99 mg/dL   BUN 46 (H) 6 - 20 mg/dL   Creatinine, Ser 6.44 (H) 0.44 - 1.00 mg/dL   Calcium 7.9 (L) 8.9 - 10.3 mg/dL   Phosphorus 4.7 (H) 2.5 - 4.6 mg/dL   Albumin 2.2 (L) 3.5 - 5.0 g/dL   GFR calc non Af Amer 7 (L) >60 mL/min   GFR calc Af Amer 8 (L) >60 mL/min    Comment: (NOTE) The eGFR has been calculated using the CKD EPI equation. This calculation has not been validated in all clinical situations. eGFR's persistently <60 mL/min signify possible Chronic Kidney Disease.    Anion gap 8 5 - 15    Comment: Performed at [facility] [address] [place], [place] [address]  CK     Status: None   Collection Time: 07/03/18  8:43 AM  Result Value Ref Range   Total CK 64 38 - 234 U/L    Comment: Performed at [facility], [place] [address], [place], [place] [address]  Hemoglobin and hematocrit, blood     Status: Abnormal   Collection Time: 07/03/18  8:43 AM  Result Value Ref Range   Hemoglobin 7.9 (L) 12.0 - 15.0 g/dL   HCT 25.5 (L) 36.0 - 46.0 %    Comment: Performed at [facility], [place] [address], [place], [place] [address]  Glucose, capillary     Status: Abnormal   Collection Time: 07/03/18  1:21 PM  Result Value Ref Range   Glucose-Capillary 148 (H) 70 - 99 mg/dL  Basic metabolic panel     Status: Abnormal   Collection Time: 07/03/18  3:05 PM  Result Value Ref Range   Sodium 134 (L) 135 - 145 mmol/L   Potassium 5.1 3.5 - 5.1 mmol/L   Chloride 112 (H) 98 - 111 mmol/L   CO2 11 (L) 22 - 32 mmol/L   Glucose, Bld 167 (H) 70 - 99 mg/dL  BUN 45 (H) 6 - 20 mg/dL   Creatinine, Ser 5.75 (H) 0.44 - 1.00 mg/dL   Calcium 8.1 (L) 8.9 - 10.3 mg/dL   GFR calc non Af Amer 8 (L) >60 mL/min   GFR calc Af Amer 10 (L) >60 mL/min    Comment:  (NOTE) The eGFR has been calculated using the CKD EPI equation. This calculation has not been validated in all clinical situations. eGFR's persistently <60 mL/min signify possible Chronic Kidney Disease.    Anion gap 11 5 - 15    Comment: Performed at [facility] [address], [place], [place] [address]  Glucose, capillary     Status: Abnormal   Collection Time: 07/03/18  4:21 PM  Result Value Ref Range   Glucose-Capillary 197 (H) 70 - 99 mg/dL  Glucose, capillary     Status: Abnormal   Collection Time: 07/03/18  9:06 PM  Result Value Ref Range   Glucose-Capillary 108 (H) 70 - 99 mg/dL  Vancomycin, random     Status: None   Collection Time: 07/04/18  6:08 AM  Result Value Ref Range   Vancomycin Rm 17     Comment:        Random Vancomycin therapeutic range is dependent on dosage and time of specimen collection. A peak range is 20.0-40.0 ug/mL A trough range is 5.0-15.0 ug/mL        Performed at [facility] [address] [place], [place] [address]   CBC     Status: Abnormal   Collection Time: 07/04/18  6:08 AM  Result Value Ref Range   WBC 13.7 (H) 4.0 - 10.5 K/uL   RBC 2.88 (L) 3.87 - 5.11 MIL/uL   Hemoglobin 8.5 (L) 12.0 - 15.0 g/dL   HCT 27.6 (L) 36.0 - 46.0 %   MCV 95.8 80.0 - 100.0 fL   MCH 29.5 26.0 - 34.0 pg   MCHC 30.8 30.0 - 36.0 g/dL   RDW 15.0 11.5 - 15.5 %   Platelets 386 150 - 400 K/uL   nRBC 0.1 0.0 - 0.2 %    Comment: Performed at [facility], [place] [address] [place], [place] [address]  Renal function panel     Status: Abnormal   Collection Time: 07/04/18  6:08 AM  Result Value Ref Range   Sodium 136 135 - 145 mmol/L   Potassium 5.0 3.5 - 5.1 mmol/L   Chloride 114 (H) 98 - 111 mmol/L   CO2 13 (L) 22 - 32 mmol/L   Glucose, Bld 136 (H) 70 - 99 mg/dL   BUN 43 (H) 6 - 20 mg/dL   Creatinine, Ser 5.98 (H) 0.44 - 1.00 mg/dL   Calcium 8.3 (L) 8.9 - 10.3 mg/dL   Phosphorus 4.3 2.5 - 4.6 mg/dL   Albumin 2.3 (L) 3.5 - 5.0 g/dL   GFR  calc non Af Amer 8 (L) >60 mL/min   GFR calc Af Amer 9 (L) >60 mL/min    Comment: (NOTE) The eGFR has been calculated using the CKD EPI equation. This calculation has not been validated in all clinical situations. eGFR's persistently <60 mL/min signify possible Chronic Kidney Disease.    Anion gap 9 5 - 15    Comment: Performed at [facility] [address] [place], [place] [address]  Glucose, capillary     Status: Abnormal   Collection Time: 07/04/18  6:11 AM  Result Value Ref Range   Glucose-Capillary 131 (H) 70 -  99 mg/dL  Glucose, capillary     Status: Abnormal   Collection Time: 07/04/18 11:21 AM  Result Value Ref Range   Glucose-Capillary 110 (H) 70 - 99 mg/dL    Dg Chest 2 View  Result Date: 07/02/2018 CLINICAL DATA:  Sepsis, HTN, DM, ex-smoker EXAM: CHEST - 2 VIEW COMPARISON:  Chest x-ray dated 09/12/2017. FINDINGS: Heart size and mediastinal contours are within normal limits, stable. Lungs are clear. No pleural effusion or pneumothorax seen. Osseous structures about the chest are unremarkable. IMPRESSION: No active cardiopulmonary disease. No evidence of pneumonia or pulmonary edema. Electronically Signed   By: Franki Cabot M.D.   On: 07/02/2018 23:03   US Renal  Result Date: 07/03/2018 CLINICAL DATA:  Acute kidney injury EXAM: RENAL / URINARY TRACT ULTRASOUND COMPLETE COMPARISON:  None. FINDINGS: Right Kidney: Renal measurements: 10.2 x 4.9 x 5.7 cm = volume: 148 mL . Echogenicity within normal limits. No mass or hydronephrosis visualized. Left Kidney: Renal measurements: 9.2 x 5.2 x 4.3 cm = volume: 106 mL. RIGHT kidney incompletely visualized. Echogenicity within normal limits. No mass or hydronephrosis visualized. Bladder: Appears normal for degree of bladder distention. IMPRESSION: No acute findings.  No hydronephrosis. Electronically Signed   By: Franki Cabot M.D.   On: 07/03/2018 02:18   Mr Foot Left Wo Contrast  Result Date: 07/03/2018 CLINICAL DATA:  Left  foot pain and fever. Cellulitis. Osteomyelitis suspected. Immunocompromised patient (HIV infection, diabetes and chronic kidney disease). EXAM: MRI OF THE LEFT FOOT WITHOUT CONTRAST TECHNIQUE: Multiplanar, multisequence MR imaging of the left forefoot was performed. No intravenous contrast was administered. COMPARISON:  Left foot radiographs 07/02/2018. FINDINGS: Ankle findings are dictated separately. Bones/Joint/Cartilage There is abnormal low T1 signal surrounding the middle and distal phalanges of the 4th toe, best seen on series 5 and 6. There is no definite corresponding abnormality on yesterday's radiographs, but this is suspicious for soft tissue emphysema. There is underlying mild marrow edema throughout the phalanges of the 4th toe. There is some decreased T1 signal in the middle and distal phalanges. No cortical destruction identified. No significant joint effusion. The additional toes appear normal. The metatarsophalangeal joints appear normal. There are mild degenerative changes at the Lisfranc joint with subchondral cyst formation in the middle cuneiform and metatarsal bases. Ligaments Intact Lisfranc ligament. 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 No significant tenosynovitis. Soft tissues The 4th toe is swollen with diffusely decreased T1 signal throughout the subcutaneous fat. There are possible pressure lesions plantar to the 4th and 5th metatarsal heads. No abnormality of the adjacent metatarsal-phalangeal joints is seen. There is generalized edema throughout the subcutaneous fat, greatest dorsally. No focal fluid collection. IMPRESSION: 1. Swelling of the 4th toe with suspected soft tissue emphysema and marrow changes suspicious for early osteomyelitis. 2. Generalized forefoot cellulitis without focal fluid collection. 3. Midfoot degenerative changes. 4. Ankle findings dictated separately. Electronically Signed    By: Richardean Sale M.D.   On: 07/03/2018 12:45   Mr Ankle Left Wo Contrast  Result Date: 07/03/2018 CLINICAL DATA:  Left foot pain and fever. Cellulitis. Osteomyelitis suspected. Immunocompromised patient (HIV infection, diabetes and chronic kidney disease). EXAM: MRI OF THE LEFT ANKLE WITHOUT CONTRAST TECHNIQUE: Multiplanar, multisequence MR imaging of the ankle was performed. No intravenous contrast was administered. COMPARISON:  Foot radiographs 07/02/2018. FINDINGS: Forefoot findings dictated separately. TENDONS Peroneal: Intact and normally positioned. Posteromedial: Intact and normally positioned. Anterior: Intact and normally positioned. Achilles: Intact. Plantar  Fascia: Intact. LIGAMENTS Lateral: Not optimally evaluated, although grossly intact. Medial: The deltoid and visualized portions of the spring ligament appear intact. CARTILAGE AND BONES Ankle Joint: Small ankle joint effusion. The talar dome and tibial plafond appear normal. Subtalar Joints/Sinus Tarsi: Small subtalar joint effusion and mild edema in the tarsal sinus. Bones: Patchy T2 hyperintensity throughout the tarsal bones, likely reactive. There are degenerative changes at the Lisfranc joint, with subchondral cysts in the middle cuneiform. No evidence of osteomyelitis at the ankle, hindfoot or midfoot. Other: Generalized subcutaneous edema, greatest laterally. No focal fluid collection or skin ulceration identified. IMPRESSION: 1. Forefoot findings dictated separately. 2. No evidence of osteomyelitis within the left ankle, hindfoot or forefoot. 3. Generalized soft tissue edema, greatest laterally, likely cellulitis. No evidence of soft tissue abscess. 4. Intact ankle tendons and ligaments. Midfoot degenerative changes. Electronically Signed   By: Richardean Sale M.D.   On: 07/03/2018 12:44   Dg Foot Complete Left  Result Date: 07/02/2018 CLINICAL DATA:  Left foot pain and redness. EXAM: LEFT FOOT - COMPLETE 3+ VIEW COMPARISON:  None.  FINDINGS: There is no evidence of fracture or dislocation. There is no evidence of arthropathy or other focal bone abnormality. Mild dorsal soft tissue swelling. IMPRESSION: No acute osseous injury of the left foot. Mild dorsal soft tissue swelling. Electronically Signed   By: Ulyses Jarred M.D.   On: 07/02/2018 23:06    Review of Systems  Constitutional: Negative for weight loss.  HENT: Negative for ear discharge, ear pain, hearing loss and tinnitus.   Eyes: Negative for blurred vision, double vision, photophobia and pain.  Respiratory: Negative for cough, sputum production and shortness of breath.   Cardiovascular: Negative for chest pain.  Gastrointestinal: Negative for abdominal pain, nausea and vomiting.  Genitourinary: Negative for dysuria, flank pain, frequency and urgency.  Musculoskeletal: Positive for joint pain (Left foot). Negative for back pain, falls, myalgias and neck pain.  Neurological: Negative for dizziness, tingling, sensory change, focal weakness, loss of consciousness and headaches.  Endo/Heme/Allergies: Does not bruise/bleed easily.  Psychiatric/Behavioral: Negative for depression, memory loss and substance abuse. The patient is not nervous/anxious.    Blood pressure 133/72, pulse 88, temperature 98.7 F (37.1 C), temperature source Oral, resp. rate 20, height '5\' 5"'$  (1.651 m), weight 71.2 kg, last menstrual period 06/27/2018, SpO2 100 %. Physical Exam  Constitutional: She appears well-developed and well-nourished. No distress.  HENT:  Head: Normocephalic and atraumatic.  Eyes: Conjunctivae are normal. Right eye exhibits no discharge. Left eye exhibits no discharge. No scleral icterus.  Neck: Normal range of motion.  Cardiovascular: Normal rate and regular rhythm.  Respiratory: Effort normal. No respiratory distress.  Musculoskeletal:  LLE No traumatic wounds, ecchymosis, or rash  Minimal TTP midfoot  No knee or ankle effusion  Knee stable to varus/ valgus and  anterior/posterior stress  Sens DPN, SPN, TN intact  Motor EHL, ext, flex, evers 5/5  DP 1+, PT 0, 1+ pitting edema, fusiform edema 4th toe  Neurological: She is alert.  Skin: Skin is warm and dry. She is not diaphoretic.  Psychiatric: She has a normal mood and affect. Her behavior is normal.    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 Sharol Given to evaluate this afternoon or in AM. Suspect recommendation will be amputation, likely on Wednesday. Given relatively weak and absent pulses will check ABI's. HIV DM CKD    Lisette Abu, PA-C Orthopedic Surgery [phone] 07/04/2018, 1:46 PM

## 2018-07-04 NOTE — Consult Note (Signed)
[facility]    Date of Admission:  07/02/2018   Total days of antibiotics 2        Day 2 vancomycin + cefepime              Reason for Consult: DFU / AKI on HIV regimen     Referring Provider: Beryle Beams  Primary Care Provider: Lars Mage, MD   Assessment: 42 y.o. female with known and well controlled HIV here with worsening acute on chronic kidney disease in the setting of treating for new osteomyelitis of her left #4 toe. MRI w/o abscess or drainable foci and non purulent ulceration. Will stop her vancomycin and start PO doxycycline (this offers good bone penetration); change cefepime to ampicillin-sulbactam to target more anaerobes as she has no h/o purulent drainage or pseudomonas in the past.   Regarding her acute on chronic kidney disease (stage 4 prior to admit with baseline ~3) likely not do to her chronic tenofovir use but more cumulative effect of other nephrotoxic agents (vancomycin), sepsis picture and possibly advancing of her known chronic disease. Would like to change to tenofovir sparing regimen to help with recovery of function - unable to use Juluca d/t poor consistent oral intake. Will check HLA [id number] to see if we can use abacavir with renally dosed lamivudine 50 mg QD and Dolutegravir once daily. Her creatinine peaked at 6.2 and now trending down with adequate urine output and no urgent needs for dialysis. She will likely need to start seeing outpatient nephrology (not currently seeing anyone). ABIs pending but pulse palpable and foot warm.     Plan: 1. Stop vancomycin  2. Start doxycycline 100 mg BID with meals  3. Stop cefepime  4. Start ampicillin-sulbactam  5. Continue Tivicay + Descovy for now (creatinine trending down) 6. Follow creatinine    Principal Problem:   Osteomyelitis of foot, left, acute (HCC) Active Problems:   Essential hypertension   Human immunodeficiency virus (HIV) disease (HCC)   AKI (acute kidney  injury) ([place])   Acute on chronic anemia   . amLODipine  10 mg Oral Daily  . citalopram  20 mg Oral Daily  . dolutegravir  50 mg Oral Daily  . doxycycline  100 mg Oral Q12H  . emtricitabine-tenofovir AF  1 tablet Oral Daily  . insulin aspart  0-9 Units Subcutaneous TID WC  . insulin glargine  8 Units Subcutaneous QHS  . labetalol  100 mg Oral BID  . multivitamin with minerals  1 tablet Oral Daily  . pantoprazole  40 mg Oral Daily  . sodium bicarbonate  650 mg Oral BID    HPI: Kathy Frank is a 42 y.o. female admitted on 11/23 for cellulitis/osteomyelitis. She has a history of poorly controlled diabetes and well controlled HIV (VL 80, CD4 450 May 2019) on Tivicay and Descovy.  Presented to the hospital with fevers > 101 F and LLE pain/wound. She estimates that her ulcer on the toe has been present less than a month. Does not recall any injury to the toe. Does have feeling but some neuropathy with long standing diabetes. No drainage from the wound. She is in care for her diabetes and was recently Blood cultures were drawn and started on empiric cefepime and vancomycin. Random Vanc level 17 this AM with creatinine up to 6.18. UOP last 24h was 2L. MRI ankle is w/o concern for osteomyelitis. L #4 toe with early osteomyelitis findings. ABIs pending  as of today. She had a temp of 103 F last night but feeling a little better overall. Has only seen nephrology one time in the past and not regularly. Has had a waxing and waning plaque-like rash over extensor areas of her skin that she sees dermatology for but is frustrated because the creams don't work.  Review of Systems  Constitutional: Negative for chills and fever.  HENT: Negative for tinnitus.   Eyes: Negative for blurred vision and photophobia.  Respiratory: Negative for cough and sputum production.   Cardiovascular: Negative for chest pain.  Gastrointestinal: Negative for diarrhea, nausea and vomiting.  Genitourinary: Negative for  dysuria.  Musculoskeletal: Positive for joint pain.  Skin: Positive for itching and rash.  Neurological: Negative for headaches.    Past Medical History:  Diagnosis Date  . CKD (chronic kidney disease) stage 3, GFR 30-59 ml/min (HCC) 06/19/2016   06/30/16: Seen by Dr. Posey Pronto [Nephrology]. Suspect etiologies to include diabetes and hypertension though cannot exclude HIV-associated nephropathy. Advised her against from using excessive Goody powder.  . CKD (chronic kidney disease), stage III ([place])   . Contact dermatitis 04/21/2016   07/29/16: Skin biopsy performed by [facility] notable for acute spongiotic dermatitis consistent with contact dermatitis.  . Cyclical vomiting    THC induced???  . Depression   . Depression 06/28/2006   Qualifier: Diagnosis of  By: Riccardo Dubin MD, Sherren Mocha    . Diabetes mellitus type 2 in obese ([place]) 06/28/1994  . DKA (diabetic ketoacidoses) ([place]) 12/2014  . Erosive esophagitis   . Esophageal reflux   . Essential hypertension 11/16/2013  . Gastroparesis    ? diabetic  . GERD (gastroesophageal reflux disease)   . Human immunodeficiency virus (HIV) disease ([place]) dx'd 04/2016  . Human immunodeficiency virus (HIV) disease ([place]) 04/23/2016  . Hypertension   . Moderate nonproliferative diabetic retinopathy of both eyes ([place]) 11/21/2014   11/14/14: Noted on retinal imaging; needs follow-up imaging in 6 months  05/22/16: Noted on retinal imaging again; needs follow-up imaging in 6 months  . Normocytic anemia   . TOBACCO USER 06/28/2006   Qualifier: Diagnosis of  By: Riccardo Dubin MD, Sherren Mocha    . Type II diabetes mellitus ([place]) 1994   diagnosed around 69    Social History   Tobacco Use  . Smoking status: Former Smoker    Packs/day: 0.10    Years: 10.00    Pack years: 1.00    Types: Cigarettes    Start date: 11/17/2013    Last attempt to quit: 09/09/2014    Years since quitting: 3.8  . Smokeless tobacco: Never Used  Substance Use Topics  . Alcohol use: Yes     Comment: Occasionally.  . Drug use: Yes    Frequency: 4.0 times per week    Types: Marijuana    Family History  Problem Relation Age of Onset  . Diabetes Mother   . Diabetes Brother   . Diabetes Daughter   . Diabetes Daughter   . Mental retardation Brother        died from PNA  . Diabetes Maternal Grandmother    No Known Allergies  OBJECTIVE: Blood pressure (!) 173/96, pulse 90, temperature 99.2 F (37.3 C), temperature source Oral, resp. rate 20, height '5\' 5"'$  (1.651 m), weight 71.2 kg, last menstrual period 06/27/2018, SpO2 96 %.  Physical Exam  Constitutional: She is oriented to person, place, and time. She appears well-developed and well-nourished.  Resting in bed   HENT:  Mouth/Throat: Mucous  membranes are normal. No oral lesions. Normal dentition. No dental abscesses. No oropharyngeal exudate.  Cardiovascular: Normal rate, regular rhythm and normal heart sounds.  Pulmonary/Chest: Effort normal and breath sounds normal.  Abdominal: Soft. She exhibits no distension. There is no tenderness.  Musculoskeletal: She exhibits no deformity.  Left foot pictured below. No purulence.   Lymphadenopathy:    She has no cervical adenopathy.  Neurological: She is alert and oriented to person, place, and time.  Skin: Skin is warm and dry. No rash noted.  Psychiatric: She has a normal mood and affect. Judgment normal.  In good spirits today and engaged in care discussion  Vitals reviewed.    Small ulceration w/o drainage on the L lateral #4 toe w/ some darkening/ischemic findings. Surrounding swelling of the forefoot. Pain present on exam.      Lab Results Lab Results  Component Value Date   WBC 13.7 (H) 07/04/2018   HGB 8.5 (L) 07/04/2018   HCT 27.6 (L) 07/04/2018   MCV 95.8 07/04/2018   PLT 386 07/04/2018    Lab Results  Component Value Date   CREATININE 5.98 (H) 07/04/2018   BUN 43 (H) 07/04/2018   NA 136 07/04/2018   K 5.0 07/04/2018   CL 114 (H) 07/04/2018   CO2  13 (L) 07/04/2018    Lab Results  Component Value Date   ALT 8 07/02/2018   AST 9 (L) 07/02/2018   ALKPHOS 86 07/02/2018   BILITOT 0.2 (L) 07/02/2018     Microbiology: Recent Results (from the past 240 hour(s))  Blood Culture (routine x 2)     Status: None (Preliminary result)   Collection Time: 07/02/18  9:49 PM  Result Value Ref Range Status   Specimen Description BLOOD LEFT ANTECUBITAL  Final   Special Requests   Final    BOTTLES DRAWN AEROBIC AND ANAEROBIC Blood Culture results may not be optimal due to an inadequate volume of blood received in culture bottles   Culture   Final    NO GROWTH 2 DAYS Performed at [facility] [address] [place], [place] [address]    Report Status PENDING  Incomplete  Blood Culture (routine x 2)     Status: None (Preliminary result)   Collection Time: 07/02/18 11:35 PM  Result Value Ref Range Status   Specimen Description BLOOD LEFT HAND  Final   Special Requests   Final    BOTTLES DRAWN AEROBIC AND ANAEROBIC Blood Culture adequate volume   Culture   Final    NO GROWTH 2 DAYS Performed at [facility], [place] [address], [place], [place] [address]    Report Status PENDING  Incomplete    Janene Madeira, MSN, NP-C [facility] [place] Cell: [phone] Pager: [phone]  07/04/2018 12:06 PM

## 2018-07-04 NOTE — Progress Notes (Signed)
Pharmacy Antibiotic Note  Kathy Frank is a 42 y.o. female admitted on 07/02/2018 with cellulitis/osteomyelitis.  Pharmacy has been consulted for Vancomycin/Cefepime dosing. WBC mildly elevated. Pt in acute on chronic renal failure. Scr 6.74 on admit, dropped to 5.75 yesterday but back up a bit to 5.98 this AM (baseline around 3-4). Hx HIV.  11/23 BCx - ngtd  Vancomycin 1500 mg IV x 1 given 11/24 at [facility] with vancomycin random level ordered 11/25 AM prior to entering maintenance doses. VR resulted at 17 this AM.  Plan: -Continue vancomycin 1g IV q48h -Cefepime 1g IV q24h -Monitor clinical progress, c/s, watch renal function closely -F/u de-escalation plan/LOT, vancomycin levels as indicated   Height: 5\' 5"  (165.1 cm) Weight: 157 lb (71.2 kg) IBW/kg (Calculated) : 57  Temp (24hrs), Avg:100.2 F (37.9 C), Min:98.8 F (37.1 C), Max:102.8 F (39.3 C)  Recent Labs  Lab 07/02/18 2156 07/02/18 2330 07/02/18 2337 07/03/18 0843 07/03/18 1505 07/04/18 0608  WBC  --  12.5*  --   --   --  13.7*  CREATININE  --  6.74*  --  6.44* 5.75* 5.98*  LATICACIDVEN 1.03  --  0.52  --   --   --   VANCORANDOM  --   --   --   --   --  17    Estimated Creatinine Clearance: 12.3 mL/min (A) (by C-G formula based on SCr of 5.98 mg/dL (H)).    No Known Allergies  Elicia Lamp, PharmD, BCPS Clinical Pharmacist Clinical phone [phone] Please check AMION for all [facility] contact numbers 07/04/2018 10:51 AM

## 2018-07-04 NOTE — Progress Notes (Signed)
VASCULAR LAB PRELIMINARY  ARTERIAL  ABI completed: Right: Resting right ankle-brachial index indicates mild right lower extremity arterial disease. The right toe-brachial index is abnormal.   Left: Resting left ankle-brachial index indicates moderate left lower extremity arterial disease. The left toe-brachial index is abnormal.     RIGHT    LEFT    PRESSURE WAVEFORM  PRESSURE WAVEFORM  BRACHIAL 137 Biphasic BRACHIAL 142 Biphasic  DP 125 Biphasic DP 86  monophasic  PT 116 Monophasic PT 33 DampenedMonophasic  GREAT TOE 73  GREAT TOE 44     RIGHT LEFT  ABI/TBI 0.88/0.51 0.61/0.31   HONGYING  Tyrel Lex, RVT 07/04/2018, 5:15 PM

## 2018-07-04 NOTE — Progress Notes (Signed)
Subjective: She is tearful this morning due to new osteomyelitis diagnosis, she does not have a history of LE numbness or tingling. She is having persistent LLE pain that is not controlled. It is unclear if she is a type II diabetic or type I as she has been on insulin since diabetes diagnosis at age 42. She does have a very strong family history of diabetes as well. She also has had some nausea with vomiting yesterday but has been drinking fluids. We discussed that she has medications available to help with her nausea as needed.   Objective:  Vital signs in last 24 hours: Vitals:   07/03/18 1659 07/03/18 2019 07/03/18 2245 07/04/18 0404  BP: (!) 173/88 (!) 161/77  (!) 173/96  Pulse:  98  90  Resp:  20  20  Temp:  (!) 102.8 F (39.3 C) 100 F (37.8 C) 99.2 F (37.3 C)  TempSrc:  Oral Oral Oral  SpO2:  99%  96%  Weight:      Height:       Physical Exam Constitution: NAD, tearful, lying in bed Cardio: RRR, no m/r/g Respiratory: CTAB, no wheezing or rales Abdominal: NTTP, soft, non-distended MSK: moving all extremities, no lower leg edema Neuro: a&o, cooperative, normal affect Skin: LLE edema of forefoot and metatarsals, bullae of 4th metatarsal, TTP; RLE wnl   Assessment/Plan:  Principal Problem:   Osteomyelitis of foot, left, acute (HCC) Active Problems:   Essential hypertension   AKI (acute kidney injury) ([place])   Acute on chronic anemia   Kathy Frank is a 42yo female w/pmh of HIV (CD4 count 450 on 5/19 on descovy & tivicay), TIIDM (type I?), HTN, GERD, and CKD 3 who presented with sepsis 2/2 L foot cellulitis and fourth metatarsal osteomyelitis on MRI with AKI on CKD.   Sepsis 2/2 L foot cellulitis & 4th metatarsal osteo Afebrile, on vanc & cefepime. Leukocytosis 12.5 >> 13.5 today. She is having persistent LLE pain. Orthopedics consulted this morning and appreciate their assistance. Discussed diagnosis of osteomyelitis, and she was feeling sad about possible surgery. She  states she does not have neuropathy.   - cont. Vanc, cefepime - orthopedic surgery consulted  - ABIs pending  - consider consult with chaplain, will discuss with patient  - IVF NS 75 cc/hr - am CBC    AKI on CKD HTN AKI 2/2 sepsis and dehydration. Administration of IV fluids limited by persistent hypertension. GFR continues to be decreased with no change, will discuss case with nephrology. Will continue at 75 cc/hr and monitor. LR CI due to borderline hyperkalemia. Home blood pressure medications were held yesterday and will begin to introduce these today.   - restart labetalol 100 mg bid, norvasc 10 mg qd - labetalol 10 mg q6h prn  - nephrology consulted, appreciate their recommendations  - am BMP   Acute on Chronic Iron Deficiency Anemia Acute anemia 2/2 sepsis, Hgb 8.5 today s/p transfusion 1U RBC yesterday.   - iron supplement once infection resolved  - am CBC   HIV On descovy and tivicay, sees Kathy Frank outpatient. Descovy is contraindicated with GFR < 30. Will discuss with ID   - ID consulted   TIIDM Insulin dependent since age 49 with strong family history for diabetes. Possibly she is type I diabetic.   - Cont. Lantus 8U qhs & SSI  VTE: SCDs IVF: 75cc/hr NS Diet: NPO Code: full  Dispo: Anticipated discharge in approximately 3-4 days.   Kathy Frank A, DO 07/04/2018,  10:18 AM Pager: [phone]

## 2018-07-04 NOTE — Progress Notes (Signed)
Medicine attending: I examined this patient today and I concur with the evaluation and management plan as recorded by resident physician Dr Molli Hazard.  42 year old woman with treated HIV disease who has been an insulin-dependent diabetic since high school admitted on November 23 to evaluate pain and swelling of her left foot.  White count 12,500 with 81% neutrophils.  She became febrile to 102.8 degrees within the first 24 hours of admission.  Hemoglobin 6.8.  BUN 47, creatinine 6.7.  Most recent recorded BUN 28 and creatinine 3.9 on May 16.  Chronic progressive renal dysfunction.  Baseline creatinine 1.72 years ago, 2.5-3.3-3.31-year ago. She had a small 4 x 4 millimeter ulceration distal left second toe. MRI suspicious for early osteomyelitis involving the left fourth toe.  Associated cellulitis of the foot. Currently on Unasyn. Awaiting orthopedic surgery consultation. Blood sugars under reasonable control at present. Minimal improvement in renal function.  At some point we will involve nephrology for early counseling since she is heading towards end-stage renal disease.  Currently CKD 5.

## 2018-07-05 ENCOUNTER — Encounter (HOSPITAL_COMMUNITY)
Admission: EM | Disposition: A | Discharge status: Home / Self Care | Payer: Self-pay | Source: Home / Self Care | Attending: Internal Medicine

## 2018-07-05 ENCOUNTER — Encounter (HOSPITAL_COMMUNITY): Payer: Self-pay | Admitting: Surgery

## 2018-07-05 ENCOUNTER — Encounter (HOSPITAL_COMMUNITY): Payer: Medicaid Other

## 2018-07-05 ENCOUNTER — Inpatient Hospital Stay (HOSPITAL_COMMUNITY): Payer: Medicaid Other

## 2018-07-05 DIAGNOSIS — N185 Chronic kidney disease, stage 5: Secondary | ICD-10-CM

## 2018-07-05 DIAGNOSIS — E872 Acidosis: Secondary | ICD-10-CM

## 2018-07-05 DIAGNOSIS — E1151 Type 2 diabetes mellitus with diabetic peripheral angiopathy without gangrene: Secondary | ICD-10-CM

## 2018-07-05 DIAGNOSIS — I739 Peripheral vascular disease, unspecified: Secondary | ICD-10-CM

## 2018-07-05 DIAGNOSIS — N186 End stage renal disease: Secondary | ICD-10-CM

## 2018-07-05 DIAGNOSIS — I129 Hypertensive chronic kidney disease with stage 1 through stage 4 chronic kidney disease, or unspecified chronic kidney disease: Secondary | ICD-10-CM

## 2018-07-05 DIAGNOSIS — Z01810 Encounter for preprocedural cardiovascular examination: Secondary | ICD-10-CM

## 2018-07-05 HISTORY — PX: PERIPHERAL VASCULAR BALLOON ANGIOPLASTY: CATH118281

## 2018-07-05 HISTORY — PX: LOWER EXTREMITY ANGIOGRAPHY: CATH118251

## 2018-07-05 LAB — BASIC METABOLIC PANEL
Anion gap: 7 (ref 5–15)
BUN: 40 mg/dL — ABNORMAL HIGH (ref 6–20)
CO2: 16 mmol/L — ABNORMAL LOW (ref 22–32)
Calcium: 8 mg/dL — ABNORMAL LOW (ref 8.9–10.3)
Chloride: 113 mmol/L — ABNORMAL HIGH (ref 98–111)
Creatinine, Ser: 6.02 mg/dL — ABNORMAL HIGH (ref 0.44–1.00)
GFR calc Af Amer: 9 mL/min — ABNORMAL LOW (ref >60)
GFR calc non Af Amer: 8 mL/min — ABNORMAL LOW (ref >60)
Glucose, Bld: 144 mg/dL — ABNORMAL HIGH (ref 70–99)
Potassium: 5 mmol/L (ref 3.5–5.1)
Sodium: 136 mmol/L (ref 135–145)

## 2018-07-05 LAB — GLUCOSE, CAPILLARY
Glucose-Capillary: 132 mg/dL — ABNORMAL HIGH (ref 70–99)
Glucose-Capillary: 152 mg/dL — ABNORMAL HIGH (ref 70–99)
Glucose-Capillary: 123 mg/dL — ABNORMAL HIGH (ref 70–99)
Glucose-Capillary: 154 mg/dL — ABNORMAL HIGH (ref 70–99)

## 2018-07-05 LAB — MAGNESIUM: Magnesium: 1.7 mg/dL (ref 1.7–2.4)

## 2018-07-05 LAB — CBC
HCT: 22.8 % — ABNORMAL LOW (ref 36.0–46.0)
Hemoglobin: 7.2 g/dL — ABNORMAL LOW (ref 12.0–15.0)
MCH: 30 pg (ref 26.0–34.0)
MCHC: 31.6 g/dL (ref 30.0–36.0)
MCV: 95 fL (ref 80.0–100.0)
Platelets: 351 10*3/uL (ref 150–400)
RBC: 2.4 MIL/uL — ABNORMAL LOW (ref 3.87–5.11)
RDW: 14.7 % (ref 11.5–15.5)
WBC: 12.1 10*3/uL — ABNORMAL HIGH (ref 4.0–10.5)
nRBC: 0 % (ref 0.0–0.2)

## 2018-07-05 SURGERY — LOWER EXTREMITY ANGIOGRAPHY
Anesthesia: LOCAL

## 2018-07-05 MED ORDER — INSULIN ASPART 100 UNIT/ML ~~LOC~~ SOLN
2.0000 [IU] | Freq: Once | SUBCUTANEOUS | Status: AC
  Administered 2018-07-05: 2 [IU] via SUBCUTANEOUS

## 2018-07-05 MED ORDER — ACETAMINOPHEN 325 MG PO TABS
650.0000 mg | ORAL_TABLET | ORAL | Status: DC | PRN
  Administered 2018-07-05: 650 mg via ORAL
  Filled 2018-07-05: qty 2

## 2018-07-05 MED ORDER — RILPIVIRINE HCL 25 MG PO TABS
25.0000 mg | ORAL_TABLET | Freq: Every day | ORAL | Status: DC
  Administered 2018-07-07 – 2018-07-11 (×5): 25 mg via ORAL
  Filled 2018-07-05 (×7): qty 1

## 2018-07-05 MED ORDER — HEPARIN SODIUM (PORCINE) 1000 UNIT/ML IJ SOLN
INTRAMUSCULAR | Status: AC
  Filled 2018-07-05: qty 1

## 2018-07-05 MED ORDER — DOLUTEGRAVIR SODIUM 50 MG PO TABS
50.0000 mg | ORAL_TABLET | Freq: Every day | ORAL | Status: DC
  Administered 2018-07-06 – 2018-07-11 (×6): 50 mg via ORAL
  Filled 2018-07-05 (×7): qty 1

## 2018-07-05 MED ORDER — ROSUVASTATIN CALCIUM 5 MG PO TABS
10.0000 mg | ORAL_TABLET | Freq: Every day | ORAL | Status: DC
  Administered 2018-07-05 – 2018-07-10 (×6): 10 mg via ORAL
  Filled 2018-07-05 (×6): qty 2

## 2018-07-05 MED ORDER — SODIUM CHLORIDE 0.9% FLUSH
3.0000 mL | Freq: Two times a day (BID) | INTRAVENOUS | Status: DC
  Administered 2018-07-05 – 2018-07-07 (×5): 3 mL via INTRAVENOUS

## 2018-07-05 MED ORDER — HYDRALAZINE HCL 20 MG/ML IJ SOLN: 5.0000 mg | INTRAMUSCULAR | Status: DC | PRN

## 2018-07-05 MED ORDER — ASPIRIN EC 81 MG PO TBEC
81.0000 mg | DELAYED_RELEASE_TABLET | Freq: Every day | ORAL | Status: DC
  Administered 2018-07-05 – 2018-07-11 (×7): 81 mg via ORAL
  Filled 2018-07-05 (×7): qty 1

## 2018-07-05 MED ORDER — FENTANYL CITRATE (PF) 100 MCG/2ML IJ SOLN
INTRAMUSCULAR | Status: AC
  Filled 2018-07-05: qty 2

## 2018-07-05 MED ORDER — FENTANYL CITRATE (PF) 100 MCG/2ML IJ SOLN
INTRAMUSCULAR | Status: DC | PRN
  Administered 2018-07-05: 25 ug via INTRAVENOUS
  Administered 2018-07-05: 50 ug via INTRAVENOUS

## 2018-07-05 MED ORDER — LIDOCAINE HCL (PF) 1 % IJ SOLN
INTRAMUSCULAR | Status: AC
  Filled 2018-07-05: qty 30

## 2018-07-05 MED ORDER — MIDAZOLAM HCL 2 MG/2ML IJ SOLN
INTRAMUSCULAR | Status: AC
  Filled 2018-07-05: qty 2

## 2018-07-05 MED ORDER — OXYCODONE HCL 5 MG PO TABS
10.0000 mg | ORAL_TABLET | Freq: Once | ORAL | Status: AC
  Administered 2018-07-05: 10 mg via ORAL
  Filled 2018-07-05: qty 2

## 2018-07-05 MED ORDER — SODIUM CHLORIDE 0.9% FLUSH: 3.0000 mL | INTRAVENOUS | Status: DC | PRN

## 2018-07-05 MED ORDER — IODIXANOL 320 MG/ML IV SOLN
INTRAVENOUS | Status: DC | PRN
  Administered 2018-07-05: 12 mL via INTRA_ARTERIAL

## 2018-07-05 MED ORDER — LABETALOL HCL 5 MG/ML IV SOLN: 10.0000 mg | INTRAVENOUS | Status: DC | PRN

## 2018-07-05 MED ORDER — MIDAZOLAM HCL 2 MG/2ML IJ SOLN
INTRAMUSCULAR | Status: DC | PRN
  Administered 2018-07-05: 2 mg via INTRAVENOUS
  Administered 2018-07-05: 1 mg via INTRAVENOUS

## 2018-07-05 MED ORDER — LIDOCAINE HCL (PF) 1 % IJ SOLN
INTRAMUSCULAR | Status: DC | PRN
  Administered 2018-07-05: 15 mL via INTRADERMAL

## 2018-07-05 MED ORDER — ONDANSETRON HCL 4 MG/2ML IJ SOLN
4.0000 mg | Freq: Four times a day (QID) | INTRAMUSCULAR | Status: DC | PRN
  Administered 2018-07-06 – 2018-07-07 (×2): 4 mg via INTRAVENOUS
  Filled 2018-07-05 (×2): qty 2

## 2018-07-05 MED ORDER — SODIUM CHLORIDE 0.9 % IV SOLN
250.0000 mL | INTRAVENOUS | Status: DC | PRN
  Administered 2018-07-08: 07:00:00 via INTRAVENOUS

## 2018-07-05 MED ORDER — HEPARIN SODIUM (PORCINE) 1000 UNIT/ML IJ SOLN
INTRAMUSCULAR | Status: DC | PRN
  Administered 2018-07-05: 7000 [IU] via INTRAVENOUS

## 2018-07-05 MED ORDER — HEPARIN (PORCINE) IN NACL 1000-0.9 UT/500ML-% IV SOLN
INTRAVENOUS | Status: DC | PRN
  Administered 2018-07-05 (×2): 500 mL

## 2018-07-05 MED ORDER — DOLUTEGRAVIR-RILPIVIRINE 50-25 MG PO TABS: 1.0000 | ORAL_TABLET | Freq: Every day | ORAL | 2 refills | Status: DC

## 2018-07-05 MED ORDER — HEPARIN (PORCINE) IN NACL 1000-0.9 UT/500ML-% IV SOLN
INTRAVENOUS | Status: AC
  Filled 2018-07-05: qty 1000

## 2018-07-05 MED FILL — JULUCA 50-25 MG TAB: 50-25 | 30 days supply | Qty: 30 | Fill #0

## 2018-07-05 SURGICAL SUPPLY — 19 items
BALLN MUSTANG 5X200X135 (BALLOONS) ×3
BALLOON MUSTANG 5X200X135 (BALLOONS) IMPLANT
CATH MUSTANG 4X200X135 (BALLOONS) ×1 IMPLANT
CATH OMNI FLUSH 5F 65CM (CATHETERS) ×1 IMPLANT
CATH QUICKCROSS .035X135CM (MICROCATHETER) ×1 IMPLANT
CLOSURE MYNX CONTROL 6F/7F (Vascular Products) ×1 IMPLANT
KIT ENCORE 26 ADVANTAGE (KITS) ×1 IMPLANT
KIT MICROPUNCTURE NIT STIFF (SHEATH) ×1 IMPLANT
KIT PV (KITS) ×3 IMPLANT
SHEATH PINNACLE 5F 10CM (SHEATH) ×1 IMPLANT
SHEATH PINNACLE 6F 10CM (SHEATH) ×1 IMPLANT
SHEATH PINNACLE ST 6F 65CM (SHEATH) ×1 IMPLANT
SHEATH PROBE COVER 6X72 (BAG) ×1 IMPLANT
SYR MEDRAD MARK V 150ML (SYRINGE) ×3 IMPLANT
TRANSDUCER W/STOPCOCK (MISCELLANEOUS) ×3 IMPLANT
TRAY PV CATH (CUSTOM PROCEDURE TRAY) ×3 IMPLANT
WIRE BENTSON .035X145CM (WIRE) ×1 IMPLANT
WIRE G V18X300CM (WIRE) ×1 IMPLANT
WIRE HI TORQ VERSACORE J 260CM (WIRE) ×1 IMPLANT

## 2018-07-05 NOTE — Progress Notes (Signed)
Subjective:  She is feeling well this morning and has not had any more nausea or vomiting. She has no pain in her foot currently and is eating breakfast. She continues to make urine.   Objective:  Vital signs in last 24 hours: Vitals:   07/04/18 1316 07/04/18 2109 07/05/18 0435 07/05/18 0900  BP: 133/72 (!) 154/84 (!) 143/77 (!) 152/82  Pulse: 88 90 88 85  Resp:  19 19   Temp: 98.7 F (37.1 C) 99.9 F (37.7 C) 99.5 F (37.5 C) 98.3 F (36.8 C)  TempSrc: Oral Oral Oral Oral  SpO2: 100% 100% 100% 99%  Weight:      Height:       Physical Exam Constitution: NAD, sitting up in bed HEENT: eom intact, no scleral icterus Cardio: RRR, no m/r/g; +2 pedal pulse right, no pedal pulse left Respiratory: CTAB, no w/r/r MSK: moving all extremities Neuro: a&o, cooperative, normal affect Skin: LLE edematous forefoot, 4th metatarsal bullous w/serosanguinous drainage, sensation intact but NTTP   Assessment/Plan:  Principal Problem:   Osteomyelitis of foot, left, acute (HCC) Active Problems:   Essential hypertension   Human immunodeficiency virus (HIV) disease (HCC)   AKI (acute kidney injury) ([place])   Acute on chronic anemia   Chronic kidney disease (CKD), stage IV (severe) (HCC)  Kathy Frank is a 42yo female w/pmh of HIV (CD4 count 450 on 5/19 on descovy & tivicay), TIIDM (type I?), HTN, GERD, and CKD 3 who presented with sepsis 2/2 L foot cellulitis and fourth metatarsal osteomyelitis on MRI with AKI on CKD. GFR persistently decreased with likely progression of CKD. Othorpedics, vascular, infectious disease and nephrology consulted. Appreciate all the input, go team.   Left foot cellulitis with Osteomyelitis 4th metatarsal Severe Peripheral Vascular Disease  PVD as shown by ABIs yesterday as well as no pedal pulse & ischemic changes. Orthopedics and vascular consulted and appreciate assistance. Plan is deferral of amputation pending LE CO2 arteriography today with angioplasty if possible  as wound healing would be very poor with immediate amputation.   - f/u vascular procedure - switched to doxy & unasyn due to AKI - stopped vanc/cefepime  AKI with CKD Progression Non-gapped metabolic acidosis Creatine remains at 6 with GFR 10 or less for the past five days with no signs of improvement s/p fluids. No signs of volume overload. Nephrology consulted and following, appreciate their input. ID consulted as well and have adjusted abx to avoid nephrotoxic agents.   - daily RFP - Nabicarb increased 1300 mg bid per nephro - 75 cc/hr NS, nausea resolved will hold fluids   Acute on Chronic Anemia 2/2 CKD Hemoglobin 7.2 << 8.5 yesterday. Continuing to monitor. Holding iron currently due to infection  - am CBC - transfuse for hemoglobin < 7  HIV Currently on descovy and tivicay. Currently continuing HIV medications but ID considering alternatives, appreciate their help.  - HLA [id number] pending to determine feasibility of switching to abacavir, lamivudine and dolutegravir   HTN Blood pressure improving. Home ;labetalol and Norvasc only started yesterday and norvasc may take longer for effect.   - cont. Current management   VTE: SCDs IVF: none Diet: NPO Code: full   Dispo: Anticipated discharge in approximately 4-5 days.   Lynell Greenhouse A, DO 07/05/2018, 10:14 AM Pager: [phone]

## 2018-07-05 NOTE — Progress Notes (Signed)
Paged Dr. Sharon Seller concerning patient transfer to 4e19 from cath lab. Pt cbg=152 and NPO. MD will assess and place orders as needed.  Paged vascular lab concerning order for vein mapping of left upper arm. Pt is on list to be seen today. Pink armband placed and pt aware. Pt resting with call bell within reach.  Will continue to monitor. Pt currently on bedrest till 1400. Payton Emerald, RN

## 2018-07-05 NOTE — Op Note (Signed)
Patient name: Kathy Frank MRN: [id number] DOB: [birth year] Sex: female  07/05/2018 Pre-operative Diagnosis: Left foot ulcer Post-operative diagnosis:  Same Surgeon:  Annamarie Major Procedure Performed:  1.  Ultrasound-guided access, right femoral artery  2.  Abdominal aortogram with CO2  3.  Left lower extremity runoff  4.  Third order catheterization  5.  Angioplasty, left superficial femoral-popliteal artery  6.  Conscious sedation (61 minutes)  7.  Mynx closure device   Indications: The patient is suffering from severe renal insufficiency approaching dialysis.  She presented with purulent drainage from her left toe.  Ultrasound studies revealed diminished toe pressures.  She comes in today for further evaluation and possible intervention.  Procedure:  The patient was identified in the holding area and taken to room 8.  The patient was then placed supine on the table and prepped and draped in the usual sterile fashion.  A time out was called.  Conscious sedation was administered with the use of IV fentanyl and Versed under continuous physician and nurse monitoring.  Heart rate, blood pressure, and oxygen saturations were continuously monitored.  Ultrasound was used to evaluate the right common femoral artery.  It was patent .  A digital ultrasound image was acquired.  A micropuncture needle was used to access the right common femoral artery under ultrasound guidance.  An 018 wire was advanced without resistance and a micropuncture sheath was placed.  The 018 wire was removed and a benson wire was placed.  The micropuncture sheath was exchanged for a 5 french sheath.  An omniflush catheter was advanced over the wire to the level of L-1.  An abdominal angiogram with CO2 was obtained.  Next, using the omniflush catheter and a benson wire, the aortic bifurcation was crossed and the catheter was placed into theleft external iliac artery and left runoff was obtained.    Findings:   Aortogram:  No significant renal artery stenosis.  The infrarenal abdominal aorta is widely patent.  Bilateral common and external iliac arteries are widely patent.  Right Lower Extremity: Not evaluated given renal disease  Left Lower Extremity: The right common femoral profundofemoral artery are widely patent without stenosis.  There are several tandem lesions within the right superficial femoral artery with stenosis greater than 70%.  Popliteal arteries patent throughout its course without stenosis.  There is two-vessel runoff via the anterior tibial and peroneal artery.  Intervention: After the above images were acquired the decision was made to proceed with intervention.  Over a 035 wire, a 6 French 65 cm sheath was advanced into the left superficial femoral artery.  The patient was fully heparinized.  I then used a 035 wire and a quick cross catheter to get access into the popliteal artery on the left.  Contrast injections were performed which revealed two-vessel runoff via the anterior tibial peroneal artery.  The posterior tibial was occluded.  I felt it was only mild to moderate disease here and decided to focus on treatment of the superficial femoral-popliteal artery.  The quick cross catheter was removed and a 2 by 200 Mustang balloon was inserted and balloon angioplasty was performed taking the balloon to 12 atm for 2 minutes.  Follow-up imaging was performed which showed luminal irregularity without obvious evidence of dissection.  I elected to upsize the balloon.  A 5 x 200 Mustang balloon was used to perform balloon angioplasty for 2 minutes at profile.  Follow-up imaging revealed significant improvement of the stenosis with residual disease less  than 10% in all 3 lesions.  I elected to stop the procedure at this time.  Catheters and wires were removed and the access site was closed successfully with a Mynx device.   Impression:  #1  3 lesions within the left superficial femoral artery all greater than 50%  with 1 approximately 90%.  They were treated with a 5 mm balloon with residual stenosis less than 10%.  #2  Two-vessel runoff via the anterior tibial and peroneal artery  #3  A total of 12 cc of contrast was utilized for this procedure    V. Annamarie Major, M.D. [facility] Office: [phone] Pager:  [phone]

## 2018-07-05 NOTE — H&P (View-Only) (Signed)
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  She has fairly brisk Doppler signals in the left foot however her noninvasive studies show a toe pressure of only 44 mmHg.  Thus the perfusion is borderline for healing a toe amputation.  I have recommended we proceed with CO2 arteriography with very minimal contrast.  I have discussed the indications for the procedure and the potential complications with the patient and she is agreeable to proceed today.  She had about 3 ounces of orange juice for breakfast this morning and nothing else.  If we find disease amenable to angioplasty this could potentially be addressed at the same time.  We will make further recommendations pending these results.  STAGE V CHRONIC KIDNEY DISEASE: She has a GFR of 9:  She is right-handed.  She will need a vein map and we can consider her for access.  Ideally would be best to address the purulent wound in her left foot before proceeding with access.  If nephrology feels that she needs a tunneled dialysis catheter than please let us know.  Deitra Mayo, MD, [place] [phone] Office: [phone]   Reason for Consult:  Left foot open wounds Requesting Physician:  Dr. Sharol Given MRN #:  [id number]  History of Present Illness: This is a 42 y.o. female being seen in consultation for evaluation of purulent wound of left fourth toe with underlying osteomyelitis.  She was admitted to the hospital 2 days prior due to increasing pain and swelling of left foot with blistering of left fourth toe.  Patient states that his blister ruptured this morning and thick yellow purulent drainage was noted.  She said she has had ulcers in the past however these have healed on their own.  She denies any rest pain bilateral lower extremities.  She does have a component of neuropathy.  Past medical history significant for  diabetes mellitus.  On admission patient also noted to be in renal failure.  Nephrology has believes this to be a progressive kidney failure however she is not overtly uremic.  No indication currently for initiation of hemodialysis per nephrology.  Past Medical History:  Diagnosis Date  . CKD (chronic kidney disease) stage 3, GFR 30-59 ml/min (HCC) 06/19/2016   06/30/16: Seen by Dr. Posey Pronto [Nephrology]. Suspect etiologies to include diabetes and hypertension though cannot exclude HIV-associated nephropathy. Advised her against from using excessive Goody powder.  . CKD (chronic kidney disease), stage III ([place])   . Contact dermatitis 04/21/2016   07/29/16: Skin biopsy performed by [facility] notable for acute spongiotic dermatitis consistent with contact dermatitis.  . Cyclical vomiting    THC induced???  . Depression   . Depression 06/28/2006   Qualifier: Diagnosis of  By: Riccardo Dubin MD, Sherren Mocha    . Diabetes mellitus type 2 in obese ([place]) 06/28/1994  . DKA (diabetic ketoacidoses) ([place]) 12/2014  . Erosive esophagitis   . Esophageal reflux   . Essential hypertension 11/16/2013  . Gastroparesis    ? diabetic  . GERD (gastroesophageal reflux disease)   . Human immunodeficiency virus (HIV) disease ([place]) dx'd 04/2016  . Human immunodeficiency virus (HIV) disease ([place]) 04/23/2016  . Hypertension   . Moderate nonproliferative diabetic retinopathy of both eyes ([place]) 11/21/2014   11/14/14: Noted on retinal imaging; needs follow-up imaging in 6 months  05/22/16: Noted on retinal imaging again; needs follow-up imaging in 6 months  .  Normocytic anemia   . TOBACCO USER 06/28/2006   Qualifier: Diagnosis of  By: Riccardo Dubin MD, Sherren Mocha    . Type II diabetes mellitus ([place]) 1994   diagnosed around 1994    Past Surgical History:  Procedure Laterality Date  . TUBAL LIGATION  2002    No Known Allergies  Prior to Admission medications   Medication Sig Start Date End Date Taking? Authorizing Provider    amLODipine (NORVASC) 10 MG tablet Take 1 tablet (10 mg total) by mouth daily. 12/24/17 07/02/18 Yes Rice, Resa Miner, MD  citalopram (CELEXA) 20 MG tablet Take 1 tablet (20 mg total) by mouth daily. 12/24/17 12/24/18 Yes Rice, Resa Miner, MD  DESCOVY 200-25 MG tablet TAKE ONE TABLET BY MOUTH EVERY DAY. Patient taking differently: Take 1 tablet by mouth daily.  02/28/18  Yes Campbell Riches, MD  furosemide (LASIX) 40 MG tablet Take 1 tablet (40 mg total) by mouth daily. 12/24/17 07/02/18 Yes Rice, Resa Miner, MD  glucose blood (ACCU-CHEK GUIDE) test strip USE TO TEST BLOOD SUGAR 3 TIMES DAILY . Diagnosis code  E11.69, E66.9 07/09/17  Yes Chundi, Vahini, MD  Insulin Glargine (LANTUS) 100 UNIT/ML Solostar Pen Inject 15 Units into the skin daily at 10 pm. Patient taking differently: Inject 15 Units into the skin every morning.  12/24/17  Yes Rice, Resa Miner, MD  labetalol (NORMODYNE) 100 MG tablet Take 1 tablet (100 mg total) by mouth 2 (two) times daily. 02/04/18 07/02/18 Yes Chundi, Vahini, MD  liraglutide (VICTOZA) 18 MG/3ML SOPN Inject 0.2 mLs (1.2 mg total) into the skin daily. 02/04/18  Yes Chundi, Verne Spurr, MD  ondansetron (ZOFRAN ODT) 4 MG disintegrating tablet Take 1 tablet (4 mg total) by mouth every 8 (eight) hours as needed for nausea or vomiting. 08/06/17  Yes Jule Ser, DO  sennosides-docusate sodium (SENOKOT-S) 8.6-50 MG tablet Take 1 tablet by mouth daily as needed for constipation. 12/24/17  Yes Rice, Resa Miner, MD  TIVICAY 50 MG tablet TAKE ONE TABLET BY MOUTH EVERY DAY Patient taking differently: Take 50 mg by mouth daily.  02/28/18  Yes Campbell Riches, MD  diclofenac sodium (VOLTAREN) 1 % GEL Apply 2 g topically 4 (four) times daily. Patient not taking: Reported on 07/02/2018 12/24/17   Collier Salina, MD  dolutegravir (TIVICAY) 50 MG tablet TAKE ONE (1) TABLET BY MOUTH EVERY DAY Patient not taking: Reported on 07/02/2018 12/24/17   Collier Salina, MD   emtricitabine-tenofovir AF (DESCOVY) 200-25 MG tablet TAKE ONE (1) TABLET BY MOUTH EVERY DAY Patient not taking: Reported on 07/02/2018 12/24/17   Collier Salina, MD  metoCLOPramide (REGLAN) 5 MG tablet Take 1 tablet (5 mg total) by mouth 3 (three) times daily before meals. Patient not taking: Reported on 07/02/2018 12/24/17   Collier Salina, MD  Multiple Vitamin (MULTIVITAMIN WITH MINERALS) TABS tablet Take 1 tablet by mouth daily. Patient not taking: Reported on 07/02/2018 12/24/17   Collier Salina, MD  pantoprazole (PROTONIX) 40 MG tablet Take 1 tablet (40 mg total) by mouth daily. Patient not taking: Reported on 07/02/2018 12/24/17   Collier Salina, MD  sodium bicarbonate 650 MG tablet Take 1 tablet (650 mg total) by mouth 2 (two) times daily. Patient not taking: Reported on 07/02/2018 12/24/17   Collier Salina, MD  triamcinolone cream (KENALOG) 0.1 % Apply 1 application topically 2 (two) times daily. Patient not taking: Reported on 07/02/2018 02/04/18   Lars Mage, MD    Social History   Socioeconomic  History  . Marital status: Single    Spouse name: Not on file  . Number of children: Not on file  . Years of education: 8  . Highest education level: Not on file  Occupational History    Employer: UNEMPLOYED  Social Needs  . Financial resource strain: Not on file  . Food insecurity:    Worry: Not on file    Inability: Not on file  . Transportation needs:    Medical: Not on file    Non-medical: Not on file  Tobacco Use  . Smoking status: Former Smoker    Packs/day: 0.10    Years: 10.00    Pack years: 1.00    Types: Cigarettes    Start date: 11/17/2013    Last attempt to quit: 09/09/2014    Years since quitting: 3.8  . Smokeless tobacco: Never Used  Substance and Sexual Activity  . Alcohol use: Yes    Comment: Occasionally.  . Drug use: Yes    Frequency: 4.0 times per week    Types: Marijuana  . Sexual activity: Not on file  Lifestyle  .  Physical activity:    Days per week: Not on file    Minutes per session: Not on file  . Stress: Not on file  Relationships  . Social connections:    Talks on phone: Not on file    Gets together: Not on file    Attends religious service: Not on file    Active member of club or organization: Not on file    Attends meetings of clubs or organizations: Not on file    Relationship status: Not on file  . Intimate partner violence:    Fear of current or ex partner: Not on file    Emotionally abused: Not on file    Physically abused: Not on file    Forced sexual activity: Not on file  Other Topics Concern  . Not on file  Social History Narrative  . Not on file     Family History  Problem Relation Age of Onset  . Diabetes Mother   . Diabetes Brother   . Diabetes Daughter   . Diabetes Daughter   . Mental retardation Brother        died from PNA  . Diabetes Maternal Grandmother     ROS: Otherwise negative unless mentioned in HPI  Physical Examination  Vitals:   07/04/18 2109 07/05/18 0435  BP: (!) 154/84 (!) 143/77  Pulse: 90 88  Resp: 19 19  Temp: 99.9 F (37.7 C) 99.5 F (37.5 C)  SpO2: 100% 100%   Body mass index is 26.13 kg/m.  General:  WDWN in NAD Gait: Not observed HENT: WNL, normocephalic Pulmonary: normal non-labored breathing, without Rales, rhonchi,  wheezing Cardiac: regular Abdomen:  soft, NT/ND, no masses Skin: with psoriatic type rash Vascular Exam/Pulses: Symmetrical femoral pulses; left DP and PT brisk by Doppler; soft peroneal by Doppler in mid lower leg Extremities: Purulent drainage expressed from the base of left dorsal fourth toe; dry healed ulceration of second toe at contact point with great toe nail Musculoskeletal: no muscle wasting or atrophy  Neurologic: A&O X 3;  No focal weakness or paresthesias are detected; speech is fluent/normal Psychiatric:  The pt has Normal affect. Lymph:  Unremarkable  CBC    Component Value Date/Time    WBC 12.1 (H) 07/05/2018 0507   RBC 2.40 (L) 07/05/2018 0507   HGB 7.2 (L) 07/05/2018 0507   HGB 10.1 (L) 12/23/2017  1558   HCT 22.8 (L) 07/05/2018 0507   HCT 31.4 (L) 12/23/2017 1558   PLT 351 07/05/2018 0507   PLT 386 (H) 12/23/2017 1558   MCV 95.0 07/05/2018 0507   MCV 98 (H) 12/23/2017 1558   MCH 30.0 07/05/2018 0507   MCHC 31.6 07/05/2018 0507   RDW 14.7 07/05/2018 0507   RDW 14.2 12/23/2017 1558   LYMPHSABS 1.0 07/02/2018 2330   LYMPHSABS 1.6 12/23/2017 1558   MONOABS 1.0 07/02/2018 2330   EOSABS 0.2 07/02/2018 2330   EOSABS 0.2 12/23/2017 1558   BASOSABS 0.1 07/02/2018 2330   BASOSABS 0.1 12/23/2017 1558    BMET    Component Value Date/Time   NA 136 07/05/2018 0507   NA 138 12/23/2017 1558   K 5.0 07/05/2018 0507   CL 113 (H) 07/05/2018 0507   CO2 16 (L) 07/05/2018 0507   GLUCOSE 144 (H) 07/05/2018 0507   BUN 40 (H) 07/05/2018 0507   BUN 28 (H) 12/23/2017 1558   CREATININE 6.02 (H) 07/05/2018 0507   CREATININE 1.68 (H) 05/27/2016 1018   CALCIUM 8.0 (L) 07/05/2018 0507   GFRNONAA 8 (L) 07/05/2018 0507   GFRNONAA 79 11/14/2014 1154   GFRAA 9 (L) 07/05/2018 0507   GFRAA >89 11/14/2014 1154    COAGS: Lab Results  Component Value Date   INR 1.19 07/02/2018     Non-Invasive Vascular Imaging:   Right ABI 0.88 Right TBI 0.51  Left ABI 0.61 Left TBI 0.31   ASSESSMENT/PLAN: This is a 42 y.o. female with purulent wound at base of L 4th toe; also found to have progressive CKD, renal failure  Purulent drainage L 4th toe base with plain film suggesting osteomyelitis Dr. Sharol Given considering surgical intervention Progressive CKD, renal failure however avoid nephrotoxins per Nephrology Will discuss LLE arteriogram with CO2 with on call vascular surgeon Dr. Shellia Carwin PA-C [facility] [phone]

## 2018-07-05 NOTE — Progress Notes (Signed)
Admit: 07/02/2018 LOS: 56  42 year old female with renal failure, favor progressive CKD over acute on chronic renal insufficiency, normal anion gap metabolic acidosis, HIV on regimen including tenofovir, presenting with ulcer of left fourth toe and underlying osteomyelitis  Subjective:  . To be seen by [facility]VS . Stable SCr, improved HCO3 . Adequate UOP . BCx 11/23 NGTD x2 . Discussed at length hemodialysis, vascular access, strong likelihood of needing it in the near future    11/25 0701 - 11/26 0700 In: 2351.8 [P.O.:1080; I.V.:1171.8; IV Piggyback:100] Out: 1250 [Urine:1250]  Filed Weights   07/02/18 2118  Weight: 71.2 kg    Scheduled Meds: . amLODipine  10 mg Oral Daily  . citalopram  20 mg Oral Daily  . dolutegravir  50 mg Oral Daily  . doxycycline  100 mg Oral Q12H  . emtricitabine-tenofovir AF  1 tablet Oral Daily  . insulin aspart  0-9 Units Subcutaneous TID WC  . insulin glargine  8 Units Subcutaneous QHS  . labetalol  100 mg Oral BID  . multivitamin with minerals  1 tablet Oral Daily  . pantoprazole  40 mg Oral Daily  . sodium bicarbonate  1,300 mg Oral BID   Continuous Infusions: . sodium chloride 75 mL/hr at 07/05/18 0400  . ampicillin-sulbactam (UNASYN) IV 3 g (07/05/18 0100)   PRN Meds:.acetaminophen **OR** acetaminophen, HYDROmorphone (DILAUDID) injection, labetalol, polyethylene glycol, prochlorperazine  Current Labs: reviewed    Physical Exam:  Blood pressure (!) 143/77, pulse 88, temperature 99.5 F (37.5 C), temperature source Oral, resp. rate 19, height 5\' 5"  (1.651 m), weight 71.2 kg, last menstrual period 06/27/2018, SpO2 100 %. GEN: No acute distress, tearful ENT: NCAT EYES: EOMI CV: RRR, 2 out of 6 midsystolic murmur best at left lower sternal border PULM: Clear bilaterally, normal work of breathing ABD: Soft, nontender SKIN: Left fourth digit ulcer evaluated EXT: Trace lower extremity edema  A 1. Renal failure, likely progressive CKD, now  stage 5; not overtly uremic 2. Osteomyelitis of left fourth toe 3. Diabetes mellitus 4. HIV, on ARV's including tenofovir or 5. Normal anion gap metabolic acidosis; likely from tenofovir or and impaired ammoniagenesis related to CKD 6. Hypertension on amlodipine, labetalol 7. Anemia, iron deficient with iron saturation of 3%, also likely related to #1 and #2  P . No hemodialysis indicated, no overt uremia . She will likely progress to symptomatic renal failure in the near future, hopefully we can get through the current issues with her lower extremities first . She will need vascular access as soon as it is surgically permissive related to problem #2 . We will ask VVS to see patient today . Check PTH . Daily weights, Daily Renal Panel, Strict I/Os, Avoid nephrotoxins (NSAIDs, judicious IV Contrast)  . Medication Issues o Preferred narcotic agents for pain control are hydromorphone, fentanyl, and methadone. Morphine should not be used.  o Baclofen should be avoided o Avoid oral sodium phosphate and magnesium citrate based laxatives / bowel preps    Pearson Grippe MD 07/05/2018, 8:43 AM  Recent Labs  Lab 07/03/18 0843 07/03/18 1505 07/04/18 0608 07/05/18 0507  NA 134* 134* 136 136  K 4.6 5.1 5.0 5.0  CL 114* 112* 114* 113*  CO2 12* 11* 13* 16*  GLUCOSE 155* 167* 136* 144*  BUN 46* 45* 43* 40*  CREATININE 6.44* 5.75* 5.98* 6.02*  CALCIUM 7.9* 8.1* 8.3* 8.0*  PHOS 4.7*  --  4.3  --    Recent Labs  Lab 07/02/18 2330 07/03/18 0843  07/04/18 0608 07/05/18 0507  WBC 12.5*  --  13.7* 12.1*  NEUTROABS 10.2*  --   --   --   HGB 6.8* 7.9* 8.5* 7.2*  HCT 22.7* 25.5* 27.6* 22.8*  MCV 101.8*  --  95.8 95.0  PLT 339  --  386 351

## 2018-07-05 NOTE — Interval H&P Note (Signed)
History and Physical Interval Note:  07/05/2018 10:19 AM  Kathy Frank  has presented today for surgery, with the diagnosis of claudication  The various methods of treatment have been discussed with the patient and family. After consideration of risks, benefits and other options for treatment, the patient has consented to  Procedure(s): LOWER EXTREMITY ANGIOGRAPHY (N/A) as a surgical intervention .  The patient's history has been reviewed, patient examined, no change in status, stable for surgery.  I have reviewed the patient's chart and labs.  Questions were answered to the patient's satisfaction.     Annamarie Major

## 2018-07-05 NOTE — Progress Notes (Addendum)
Pt is A&O x4, signed the consent for Arteriogram today. NPO maint with very little sip of water for BP meds.  1025 To Vascular via stretcher. 40 Pt was transferred to 4E19 post vascular procedure, report was given to [facility]. Will bring pt's belongings to 4E19.

## 2018-07-05 NOTE — Progress Notes (Signed)
Bilateral upper extremity vein mapping completed.

## 2018-07-05 NOTE — Consult Note (Addendum)
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  She has fairly brisk Doppler signals in the left foot however her noninvasive studies show a toe pressure of only 44 mmHg.  Thus the perfusion is borderline for healing a toe amputation.  I have recommended we proceed with CO2 arteriography with very minimal contrast.  I have discussed the indications for the procedure and the potential complications with the patient and she is agreeable to proceed today.  She had about 3 ounces of orange juice for breakfast this morning and nothing else.  If we find disease amenable to angioplasty this could potentially be addressed at the same time.  We will make further recommendations pending these results.  STAGE V CHRONIC KIDNEY DISEASE: She has a GFR of 9:  She is right-handed.  She will need a vein map and we can consider her for access.  Ideally would be best to address the purulent wound in her left foot before proceeding with access.  If nephrology feels that she needs a tunneled dialysis catheter than please let us know.  Deitra Mayo, MD, [place] [phone] Office: [phone]   Reason for Consult:  Left foot open wounds Requesting Physician:  Dr. Sharol Given MRN #:  [id number]  History of Present Illness: This is a 42 y.o. female being seen in consultation for evaluation of purulent wound of left fourth toe with underlying osteomyelitis.  She was admitted to the hospital 2 days prior due to increasing pain and swelling of left foot with blistering of left fourth toe.  Patient states that his blister ruptured this morning and thick yellow purulent drainage was noted.  She said she has had ulcers in the past however these have healed on their own.  She denies any rest pain bilateral lower extremities.  She does have a component of neuropathy.  Past medical history significant for  diabetes mellitus.  On admission patient also noted to be in renal failure.  Nephrology has believes this to be a progressive kidney failure however she is not overtly uremic.  No indication currently for initiation of hemodialysis per nephrology.  Past Medical History:  Diagnosis Date  . CKD (chronic kidney disease) stage 3, GFR 30-59 ml/min (HCC) 06/19/2016   06/30/16: Seen by Dr. Posey Pronto [Nephrology]. Suspect etiologies to include diabetes and hypertension though cannot exclude HIV-associated nephropathy. Advised her against from using excessive Goody powder.  . CKD (chronic kidney disease), stage III ([place])   . Contact dermatitis 04/21/2016   07/29/16: Skin biopsy performed by [facility] notable for acute spongiotic dermatitis consistent with contact dermatitis.  . Cyclical vomiting    THC induced???  . Depression   . Depression 06/28/2006   Qualifier: Diagnosis of  By: Riccardo Dubin MD, Sherren Mocha    . Diabetes mellitus type 2 in obese ([place]) 06/28/1994  . DKA (diabetic ketoacidoses) ([place]) 12/2014  . Erosive esophagitis   . Esophageal reflux   . Essential hypertension 11/16/2013  . Gastroparesis    ? diabetic  . GERD (gastroesophageal reflux disease)   . Human immunodeficiency virus (HIV) disease ([place]) dx'd 04/2016  . Human immunodeficiency virus (HIV) disease ([place]) 04/23/2016  . Hypertension   . Moderate nonproliferative diabetic retinopathy of both eyes ([place]) 11/21/2014   11/14/14: Noted on retinal imaging; needs follow-up imaging in 6 months  05/22/16: Noted on retinal imaging again; needs follow-up imaging in 6 months  .  Normocytic anemia   . TOBACCO USER 06/28/2006   Qualifier: Diagnosis of  By: Riccardo Dubin MD, Sherren Mocha    . Type II diabetes mellitus ([place]) 1994   diagnosed around 1994    Past Surgical History:  Procedure Laterality Date  . TUBAL LIGATION  2002    No Known Allergies  Prior to Admission medications   Medication Sig Start Date End Date Taking? Authorizing Provider    amLODipine (NORVASC) 10 MG tablet Take 1 tablet (10 mg total) by mouth daily. 12/24/17 07/02/18 Yes Rice, Resa Miner, MD  citalopram (CELEXA) 20 MG tablet Take 1 tablet (20 mg total) by mouth daily. 12/24/17 12/24/18 Yes Rice, Resa Miner, MD  DESCOVY 200-25 MG tablet TAKE ONE TABLET BY MOUTH EVERY DAY. Patient taking differently: Take 1 tablet by mouth daily.  02/28/18  Yes Campbell Riches, MD  furosemide (LASIX) 40 MG tablet Take 1 tablet (40 mg total) by mouth daily. 12/24/17 07/02/18 Yes Rice, Resa Miner, MD  glucose blood (ACCU-CHEK GUIDE) test strip USE TO TEST BLOOD SUGAR 3 TIMES DAILY . Diagnosis code  E11.69, E66.9 07/09/17  Yes Chundi, Vahini, MD  Insulin Glargine (LANTUS) 100 UNIT/ML Solostar Pen Inject 15 Units into the skin daily at 10 pm. Patient taking differently: Inject 15 Units into the skin every morning.  12/24/17  Yes Rice, Resa Miner, MD  labetalol (NORMODYNE) 100 MG tablet Take 1 tablet (100 mg total) by mouth 2 (two) times daily. 02/04/18 07/02/18 Yes Chundi, Vahini, MD  liraglutide (VICTOZA) 18 MG/3ML SOPN Inject 0.2 mLs (1.2 mg total) into the skin daily. 02/04/18  Yes Chundi, Verne Spurr, MD  ondansetron (ZOFRAN ODT) 4 MG disintegrating tablet Take 1 tablet (4 mg total) by mouth every 8 (eight) hours as needed for nausea or vomiting. 08/06/17  Yes Jule Ser, DO  sennosides-docusate sodium (SENOKOT-S) 8.6-50 MG tablet Take 1 tablet by mouth daily as needed for constipation. 12/24/17  Yes Rice, Resa Miner, MD  TIVICAY 50 MG tablet TAKE ONE TABLET BY MOUTH EVERY DAY Patient taking differently: Take 50 mg by mouth daily.  02/28/18  Yes Campbell Riches, MD  diclofenac sodium (VOLTAREN) 1 % GEL Apply 2 g topically 4 (four) times daily. Patient not taking: Reported on 07/02/2018 12/24/17   Collier Salina, MD  dolutegravir (TIVICAY) 50 MG tablet TAKE ONE (1) TABLET BY MOUTH EVERY DAY Patient not taking: Reported on 07/02/2018 12/24/17   Collier Salina, MD   emtricitabine-tenofovir AF (DESCOVY) 200-25 MG tablet TAKE ONE (1) TABLET BY MOUTH EVERY DAY Patient not taking: Reported on 07/02/2018 12/24/17   Collier Salina, MD  metoCLOPramide (REGLAN) 5 MG tablet Take 1 tablet (5 mg total) by mouth 3 (three) times daily before meals. Patient not taking: Reported on 07/02/2018 12/24/17   Collier Salina, MD  Multiple Vitamin (MULTIVITAMIN WITH MINERALS) TABS tablet Take 1 tablet by mouth daily. Patient not taking: Reported on 07/02/2018 12/24/17   Collier Salina, MD  pantoprazole (PROTONIX) 40 MG tablet Take 1 tablet (40 mg total) by mouth daily. Patient not taking: Reported on 07/02/2018 12/24/17   Collier Salina, MD  sodium bicarbonate 650 MG tablet Take 1 tablet (650 mg total) by mouth 2 (two) times daily. Patient not taking: Reported on 07/02/2018 12/24/17   Collier Salina, MD  triamcinolone cream (KENALOG) 0.1 % Apply 1 application topically 2 (two) times daily. Patient not taking: Reported on 07/02/2018 02/04/18   Lars Mage, MD    Social History   Socioeconomic  History  . Marital status: Single    Spouse name: Not on file  . Number of children: Not on file  . Years of education: 14  . Highest education level: Not on file  Occupational History    Employer: UNEMPLOYED  Social Needs  . Financial resource strain: Not on file  . Food insecurity:    Worry: Not on file    Inability: Not on file  . Transportation needs:    Medical: Not on file    Non-medical: Not on file  Tobacco Use  . Smoking status: Former Smoker    Packs/day: 0.10    Years: 10.00    Pack years: 1.00    Types: Cigarettes    Start date: 11/17/2013    Last attempt to quit: 09/09/2014    Years since quitting: 3.8  . Smokeless tobacco: Never Used  Substance and Sexual Activity  . Alcohol use: Yes    Comment: Occasionally.  . Drug use: Yes    Frequency: 4.0 times per week    Types: Marijuana  . Sexual activity: Not on file  Lifestyle  .  Physical activity:    Days per week: Not on file    Minutes per session: Not on file  . Stress: Not on file  Relationships  . Social connections:    Talks on phone: Not on file    Gets together: Not on file    Attends religious service: Not on file    Active member of club or organization: Not on file    Attends meetings of clubs or organizations: Not on file    Relationship status: Not on file  . Intimate partner violence:    Fear of current or ex partner: Not on file    Emotionally abused: Not on file    Physically abused: Not on file    Forced sexual activity: Not on file  Other Topics Concern  . Not on file  Social History Narrative  . Not on file     Family History  Problem Relation Age of Onset  . Diabetes Mother   . Diabetes Brother   . Diabetes Daughter   . Diabetes Daughter   . Mental retardation Brother        died from PNA  . Diabetes Maternal Grandmother     ROS: Otherwise negative unless mentioned in HPI  Physical Examination  Vitals:   07/04/18 2109 07/05/18 0435  BP: (!) 154/84 (!) 143/77  Pulse: 90 88  Resp: 19 19  Temp: 99.9 F (37.7 C) 99.5 F (37.5 C)  SpO2: 100% 100%   Body mass index is 26.13 kg/m.  General:  WDWN in NAD Gait: Not observed HENT: WNL, normocephalic Pulmonary: normal non-labored breathing, without Rales, rhonchi,  wheezing Cardiac: regular Abdomen:  soft, NT/ND, no masses Skin: with psoriatic type rash Vascular Exam/Pulses: Symmetrical femoral pulses; left DP and PT brisk by Doppler; soft peroneal by Doppler in mid lower leg Extremities: Purulent drainage expressed from the base of left dorsal fourth toe; dry healed ulceration of second toe at contact point with great toe nail Musculoskeletal: no muscle wasting or atrophy  Neurologic: A&O X 3;  No focal weakness or paresthesias are detected; speech is fluent/normal Psychiatric:  The pt has Normal affect. Lymph:  Unremarkable  CBC    Component Value Date/Time    WBC 12.1 (H) 07/05/2018 0507   RBC 2.40 (L) 07/05/2018 0507   HGB 7.2 (L) 07/05/2018 0507   HGB 10.1 (L) 12/23/2017  1558   HCT 22.8 (L) 07/05/2018 0507   HCT 31.4 (L) 12/23/2017 1558   PLT 351 07/05/2018 0507   PLT 386 (H) 12/23/2017 1558   MCV 95.0 07/05/2018 0507   MCV 98 (H) 12/23/2017 1558   MCH 30.0 07/05/2018 0507   MCHC 31.6 07/05/2018 0507   RDW 14.7 07/05/2018 0507   RDW 14.2 12/23/2017 1558   LYMPHSABS 1.0 07/02/2018 2330   LYMPHSABS 1.6 12/23/2017 1558   MONOABS 1.0 07/02/2018 2330   EOSABS 0.2 07/02/2018 2330   EOSABS 0.2 12/23/2017 1558   BASOSABS 0.1 07/02/2018 2330   BASOSABS 0.1 12/23/2017 1558    BMET    Component Value Date/Time   NA 136 07/05/2018 0507   NA 138 12/23/2017 1558   K 5.0 07/05/2018 0507   CL 113 (H) 07/05/2018 0507   CO2 16 (L) 07/05/2018 0507   GLUCOSE 144 (H) 07/05/2018 0507   BUN 40 (H) 07/05/2018 0507   BUN 28 (H) 12/23/2017 1558   CREATININE 6.02 (H) 07/05/2018 0507   CREATININE 1.68 (H) 05/27/2016 1018   CALCIUM 8.0 (L) 07/05/2018 0507   GFRNONAA 8 (L) 07/05/2018 0507   GFRNONAA 79 11/14/2014 1154   GFRAA 9 (L) 07/05/2018 0507   GFRAA >89 11/14/2014 1154    COAGS: Lab Results  Component Value Date   INR 1.19 07/02/2018     Non-Invasive Vascular Imaging:   Right ABI 0.88 Right TBI 0.51  Left ABI 0.61 Left TBI 0.31   ASSESSMENT/PLAN: This is a 42 y.o. female with purulent wound at base of L 4th toe; also found to have progressive CKD, renal failure  Purulent drainage L 4th toe base with plain film suggesting osteomyelitis Dr. Sharol Given considering surgical intervention Progressive CKD, renal failure however avoid nephrotoxins per Nephrology Will discuss LLE arteriogram with CO2 with on call vascular surgeon Dr. Shellia Carwin PA-C [facility] [phone]

## 2018-07-05 NOTE — Plan of Care (Signed)
  Problem: Education: Goal: Knowledge of General Education information will improve Description Including pain rating scale, medication(s)/side effects and non-pharmacologic comfort measures Outcome: Progressing   Problem: Health Behavior/Discharge Planning: Goal: Ability to manage health-related needs will improve Outcome: Progressing   

## 2018-07-05 NOTE — Progress Notes (Signed)
[facility] for Infectious Disease  Date of Admission:  07/02/2018   Total days of antibiotics 4        Day 2 ampicillin-sulbactam            Patient ID: Kathy Frank is a 42 y.o. F with  Principal Problem:   Osteomyelitis of foot, left, acute (HCC) Active Problems:   Essential hypertension   Human immunodeficiency virus (HIV) disease ([place])   AKI (acute kidney injury) ([place])   Acute on chronic anemia   Chronic kidney disease (CKD), stage IV (severe) ([place])   . amLODipine  10 mg Oral Daily  . aspirin EC  81 mg Oral Daily  . citalopram  20 mg Oral Daily  . dolutegravir  50 mg Oral Daily   And  . rilpivirine  25 mg Oral Q breakfast  . doxycycline  100 mg Oral Q12H  . insulin aspart  0-9 Units Subcutaneous TID WC  . insulin glargine  8 Units Subcutaneous QHS  . labetalol  100 mg Oral BID  . multivitamin with minerals  1 tablet Oral Daily  . rosuvastatin  10 mg Oral q1800  . sodium bicarbonate  1,300 mg Oral BID  . sodium chloride flush  3 mL Intravenous Q12H    SUBJECTIVE: Feeling fine. No complaints. She would prefer to avoid amputation if possible.    No Known Allergies  OBJECTIVE: Vitals:   07/05/18 1150 07/05/18 1155 07/05/18 1200 07/05/18 1235  BP: (!) 155/86 (!) 148/86 (!) 160/90 140/80  Pulse: 85 85 84 84  Resp: 17 15 20 12   Temp:    98.2 F (36.8 C)  TempSrc:    Oral  SpO2: 100% 100% 100% 100%  Weight:      Height:       Body mass index is 26.13 kg/m.  Physical Exam  Constitutional: She is oriented to person, place, and time. She appears well-developed and well-nourished.  Resting in bed comfortably.   HENT:  Mouth/Throat: No oral lesions. No dental abscesses.  Cardiovascular: Normal rate, regular rhythm and normal heart sounds.  Pulmonary/Chest: Effort normal and breath sounds normal.  Abdominal: Soft. She exhibits no distension. There is no tenderness.  Musculoskeletal: Normal range of motion. She exhibits no tenderness.    Lymphadenopathy:    She has no cervical adenopathy.  Neurological: She is alert and oriented to person, place, and time.  Skin: Skin is warm and dry. No rash noted.  Foot wound as previously described. Lateral ulceration to #4 toe L foot. Ischemic changes, Swelling still present.   Psychiatric: Judgment normal.  Vitals reviewed.   Lab Results Lab Results  Component Value Date   WBC 12.1 (H) 07/05/2018   HGB 7.2 (L) 07/05/2018   HCT 22.8 (L) 07/05/2018   MCV 95.0 07/05/2018   PLT 351 07/05/2018    Lab Results  Component Value Date   CREATININE 6.02 (H) 07/05/2018   BUN 40 (H) 07/05/2018   NA 136 07/05/2018   K 5.0 07/05/2018   CL 113 (H) 07/05/2018   CO2 16 (L) 07/05/2018    Lab Results  Component Value Date   ALT 8 07/02/2018   AST 9 (L) 07/02/2018   ALKPHOS 86 07/02/2018   BILITOT 0.2 (L) 07/02/2018     Microbiology: Recent Results (from the past 240 hour(s))  Blood Culture (routine x 2)     Status: None (Preliminary result)   Collection Time: 07/02/18  9:49 PM  Result Value Ref Range Status   Specimen Description BLOOD LEFT ANTECUBITAL  Final   Special Requests   Final    BOTTLES DRAWN AEROBIC AND ANAEROBIC Blood Culture results may not be optimal due to an inadequate volume of blood received in culture bottles   Culture   Final    NO GROWTH 3 DAYS Performed at [facility] [address] [place], [place] [address]    Report Status PENDING  Incomplete  Blood Culture (routine x 2)     Status: None (Preliminary result)   Collection Time: 07/02/18 11:35 PM  Result Value Ref Range Status   Specimen Description BLOOD LEFT HAND  Final   Special Requests   Final    BOTTLES DRAWN AEROBIC AND ANAEROBIC Blood Culture adequate volume   Culture   Final    NO GROWTH 3 DAYS Performed at [facility], [place] [address], [place], [place] [address]    Report Status PENDING  Incomplete   Abd aortogram with CO2, Angioplasty L superficial fem-pop artery  07/05/18 aortogram revealed no significant RAS; RLE was not evaluated d/t concern to limit contrast exposure. LLE with widely patent femoral profundofemoral artery w/o stenosis; tandem lesions with R superficial artery with stenosis > 70%; popliteal arteries patient, 2-vessel runoff via anterior tib and peroneal artery.    ASSESSMENT & PLAN:  1. HIV, well controlled = Will switch to [place] for tenofovir sparing regimen. Counseled today on taking with largest meal of the day consistently. We stopped her PPI here as there is a drug interaction. She is not on this at home. Will make sure she has FU appt and repeat VL in 4 weeks. (appt made for 12/27 @ 11:00 am with me)   2. Acute on Chronic Kidney Disease, V = vascular/nephrology preparing for impending HD. Creatinine 6 today. TAF sparing as above.   3. Severe Vascular Disease = Appreciate Dr. Trula Slade and ortho help. She was treated with balloon angioplasty of 3 lesions w/in the left superficial femoral artery with > 50% stenosis and 1 90% - post procedure < 10%.   4. Osteomyelitis L #4 toe = continue oral doxycycline and IV ampicillin-sulbactam for now. She will need 6 weeks treatment if she wishes to try to avoid amputation, which I explained today may still ultimately result in amputation. She understands and has no further questions. Will need to decide on home regimen vs bioavailable oral regimen to avoid line placement.   Janene Madeira, MSN, NP-C [facility] for Infectious [place] Cell: [phone] Pager: [phone]  07/05/2018  12:51 PM

## 2018-07-06 LAB — RENAL FUNCTION PANEL
Albumin: 2.1 g/dL — ABNORMAL LOW (ref 3.5–5.0)
Anion gap: 8 (ref 5–15)
BUN: 39 mg/dL — ABNORMAL HIGH (ref 6–20)
CO2: 15 mmol/L — ABNORMAL LOW (ref 22–32)
Calcium: 8.2 mg/dL — ABNORMAL LOW (ref 8.9–10.3)
Chloride: 112 mmol/L — ABNORMAL HIGH (ref 98–111)
Creatinine, Ser: 5.56 mg/dL — ABNORMAL HIGH (ref 0.44–1.00)
GFR calc Af Amer: 10 mL/min — ABNORMAL LOW (ref >60)
GFR calc non Af Amer: 9 mL/min — ABNORMAL LOW (ref >60)
Glucose, Bld: 180 mg/dL — ABNORMAL HIGH (ref 70–99)
Phosphorus: 4.6 mg/dL (ref 2.5–4.6)
Potassium: 5.2 mmol/L — ABNORMAL HIGH (ref 3.5–5.1)
Sodium: 135 mmol/L (ref 135–145)

## 2018-07-06 LAB — GLUCOSE, CAPILLARY
Glucose-Capillary: 139 mg/dL — ABNORMAL HIGH (ref 70–99)
Glucose-Capillary: 101 mg/dL — ABNORMAL HIGH (ref 70–99)
Glucose-Capillary: 150 mg/dL — ABNORMAL HIGH (ref 70–99)
Glucose-Capillary: 77 mg/dL (ref 70–99)

## 2018-07-06 LAB — CBC
HCT: 23.4 % — ABNORMAL LOW (ref 36.0–46.0)
Hemoglobin: 7.2 g/dL — ABNORMAL LOW (ref 12.0–15.0)
MCH: 30.1 pg (ref 26.0–34.0)
MCHC: 30.8 g/dL (ref 30.0–36.0)
MCV: 97.9 fL (ref 80.0–100.0)
Platelets: 356 10*3/uL (ref 150–400)
RBC: 2.39 MIL/uL — ABNORMAL LOW (ref 3.87–5.11)
RDW: 14.3 % (ref 11.5–15.5)
WBC: 11.7 10*3/uL — ABNORMAL HIGH (ref 4.0–10.5)
nRBC: 0 % (ref 0.0–0.2)

## 2018-07-06 LAB — MAGNESIUM: Magnesium: 1.6 mg/dL — ABNORMAL LOW (ref 1.7–2.4)

## 2018-07-06 MED ORDER — GLUCERNA SHAKE PO LIQD: 237.0000 mL | Freq: Every day | ORAL | Status: DC

## 2018-07-06 MED ORDER — MAGNESIUM SULFATE 2 GM/50ML IV SOLN
2.0000 g | Freq: Once | INTRAVENOUS | Status: AC
  Administered 2018-07-06: 2 g via INTRAVENOUS
  Filled 2018-07-06: qty 50

## 2018-07-06 MED ORDER — SENNOSIDES-DOCUSATE SODIUM 8.6-50 MG PO TABS: 1.0000 | ORAL_TABLET | Freq: Every evening | ORAL | Status: DC | PRN

## 2018-07-06 MED ORDER — JUVEN PO PACK
1.0000 | PACK | Freq: Two times a day (BID) | ORAL | Status: DC
  Administered 2018-07-07 – 2018-07-10 (×7): 1 via ORAL
  Filled 2018-07-06 (×12): qty 1

## 2018-07-06 NOTE — Progress Notes (Signed)
   VASCULAR SURGERY ASSESSMENT & PLAN:   1 Day Post-Op s/p: Angioplasty of left superficial femoral artery.  Excellent result with brisk Doppler flow in the left posterior tibial and anterior tibial positions.  Okay to proceed with surgery on the left foot from our standpoint.  STAGE V CHRONIC KIDNEY DISEASE: I have reviewed her vein map which shows she likely does not have any adequate vein for a fistula.  She would require placement of an AV graft.  Given that she has an active infection in her left foot we would have to wait on placing an AV graft.  Ideally would be best for her to have a temporary catheter until the infection is cleared before placing a tunneled dialysis catheter.  SUBJECTIVE:   No complaints this morning.  PHYSICAL EXAM:   Vitals:   07/05/18 1235 07/05/18 2037 07/06/18 0332 07/06/18 0454  BP: 140/80 (!) 149/87 139/88 (!) 151/87  Pulse: 84  86 85  Resp: 12 15 16  (!) 21  Temp: 98.2 F (36.8 C) 98.5 F (36.9 C) 99 F (37.2 C) 99.2 F (37.3 C)  TempSrc: Oral Oral Oral Oral  SpO2: 100% 100% 99% (!) 85%  Weight:      Height:       Right groin looks fine without hematoma. Brisk left dorsalis pedis and posterior tibial Doppler signals.  LABS:   Lab Results  Component Value Date   WBC 11.7 (H) 07/06/2018   HGB 7.2 (L) 07/06/2018   HCT 23.4 (L) 07/06/2018   MCV 97.9 07/06/2018   PLT 356 07/06/2018   Lab Results  Component Value Date   CREATININE 5.56 (H) 07/06/2018   Lab Results  Component Value Date   INR 1.19 07/02/2018   CBG (last 3)  Recent Labs    07/05/18 1632 07/05/18 2224 07/06/18 0626  GLUCAP 123* 154* 139*    PROBLEM LIST:    Principal Problem:   Osteomyelitis of foot, left, acute (HCC) Active Problems:   Essential hypertension   Human immunodeficiency virus (HIV) disease ([place])   AKI (acute kidney injury) ([place])   Acute on chronic anemia   Chronic kidney disease (CKD), stage IV (severe) (HCC)   CURRENT MEDS:   . amLODipine   10 mg Oral Daily  . aspirin EC  81 mg Oral Daily  . citalopram  20 mg Oral Daily  . dolutegravir  50 mg Oral Daily   And  . rilpivirine  25 mg Oral Q breakfast  . doxycycline  100 mg Oral Q12H  . insulin aspart  0-9 Units Subcutaneous TID WC  . insulin glargine  8 Units Subcutaneous QHS  . labetalol  100 mg Oral BID  . multivitamin with minerals  1 tablet Oral Daily  . rosuvastatin  10 mg Oral q1800  . sodium bicarbonate  1,300 mg Oral BID  . sodium chloride flush  3 mL Intravenous Q12H    Deitra Mayo Beeper: [phone] Office: [phone] 07/06/2018

## 2018-07-06 NOTE — Progress Notes (Signed)
Admit: 07/02/2018 LOS: 41  42 year old female with renal failure, favor progressive CKD over acute on chronic renal insufficiency, normal anion gap metabolic acidosis, HIV on regimen including tenofovir, presenting with ulcer of left fourth toe and underlying osteomyelitis  Subjective:  . Status post angiogram and angioplasty of left lower extremity yesterday; had 42mL contrast exposure . Not a candidate for AV fistula based upon vein mapping, would need AV graft . Serum creatinine stable at 5.56, potassium 5.2, bicarbonate 15 . Patient denies nausea/vomiting, dysgeusia, anorexia . Unmeasured UOP . BCx 11/23 NGTD x2   11/26 0701 - 11/27 0700 In: -  Out: 1 [Stool:1]  Filed Weights   07/02/18 2118  Weight: 71.2 kg    Scheduled Meds: . amLODipine  10 mg Oral Daily  . aspirin EC  81 mg Oral Daily  . citalopram  20 mg Oral Daily  . dolutegravir  50 mg Oral Daily   And  . rilpivirine  25 mg Oral Q breakfast  . doxycycline  100 mg Oral Q12H  . insulin aspart  0-9 Units Subcutaneous TID WC  . insulin glargine  8 Units Subcutaneous QHS  . labetalol  100 mg Oral BID  . multivitamin with minerals  1 tablet Oral Daily  . rosuvastatin  10 mg Oral q1800  . sodium bicarbonate  1,300 mg Oral BID  . sodium chloride flush  3 mL Intravenous Q12H   Continuous Infusions: . sodium chloride    . ampicillin-sulbactam (UNASYN) IV 3 g (07/06/18 0001)  . magnesium sulfate 1 - 4 g bolus IVPB     PRN Meds:.sodium chloride, [DISCONTINUED] acetaminophen **OR** acetaminophen, acetaminophen, hydrALAZINE, HYDROmorphone (DILAUDID) injection, labetalol, labetalol, ondansetron (ZOFRAN) IV, polyethylene glycol, prochlorperazine, senna-docusate, sodium chloride flush  Current Labs: reviewed    Physical Exam:  Blood pressure (!) 151/87, pulse 85, temperature 99.2 F (37.3 C), temperature source Oral, resp. rate (!) 21, height 5\' 5"  (1.651 m), weight 71.2 kg, last menstrual period 06/27/2018, SpO2 (!) 85  %. GEN: No acute distress, tearful ENT: NCAT EYES: EOMI CV: RRR, 2 out of 6 midsystolic murmur best at left lower sternal border PULM: Clear bilaterally, normal work of breathing ABD: Soft, nontender SKIN: Left fourth digit ulcer evaluated EXT: Trace lower extremity edema  A 1. Renal failure, likely progressive CKD, now stage 5; not overtly uremic; prev seen once by Posey Pronto at [facility], not since 2017 2. Osteomyelitis of left fourth toe 3. PAD status post angioplasty of left lower extremity on 11/26 4. Diabetes mellitus 5. HIV, on ARV's, now off tenofovir 6. Normal anion gap metabolic acidosis; likely from tenofovir or and impaired ammoniagenesis related to CKD 7. Hypertension on amlodipine, labetalol 8. Anemia, iron deficient with iron saturation of 3%, also likely related to #1 and #2  P . No hemodialysis indicated, no overt uremia . Would not pursue vascular access at the current time, neither catheter nor AV graft; ideally she can heel and the infection can resolve prior to either procedure.  She is not uremic and does not need to start dialysis at the current time . PTH pending . Daily weights, Daily Renal Panel, Strict I/Os, Avoid nephrotoxins (NSAIDs, judicious IV Contrast)     Pearson Grippe MD 07/06/2018, 11:11 AM  Recent Labs  Lab 07/03/18 0843  07/04/18 0608 07/05/18 0507 07/06/18 0335  NA 134*   < > 136 136 135  K 4.6   < > 5.0 5.0 5.2*  CL 114*   < > 114* 113* 112*  CO2  12*   < > 13* 16* 15*  GLUCOSE 155*   < > 136* 144* 180*  BUN 46*   < > 43* 40* 39*  CREATININE 6.44*   < > 5.98* 6.02* 5.56*  CALCIUM 7.9*   < > 8.3* 8.0* 8.2*  PHOS 4.7*  --  4.3  --  4.6   < > = values in this interval not displayed.   Recent Labs  Lab 07/02/18 2330  07/04/18 0608 07/05/18 0507 07/06/18 0335  WBC 12.5*  --  13.7* 12.1* 11.7*  NEUTROABS 10.2*  --   --   --   --   HGB 6.8*   < > 8.5* 7.2* 7.2*  HCT 22.7*   < > 27.6* 22.8* 23.4*  MCV 101.8*  --  95.8 95.0 97.9  PLT 339   --  386 351 356   < > = values in this interval not displayed.

## 2018-07-06 NOTE — Progress Notes (Signed)
Initial Nutrition Assessment  DOCUMENTATION CODES:   Not applicable  INTERVENTION:   -1 packet Juven BID, each packet provides 80 calories, 8 grams of carbohydrate, and 14 grams of amino acids; supplement contains CaHMB, glutamine, and arginine, to promote wound healing -Glucerna Shake po q HS, each supplement provides 220 kcal and 10 grams of protein  NUTRITION DIAGNOSIS:   Increased nutrient needs related to wound healing as evidenced by estimated needs.  GOAL:   Patient will meet greater than or equal to 90% of their needs  MONITOR:   PO intake, Supplement acceptance, Labs, Weight trends, Skin, I & O's  REASON FOR ASSESSMENT:   Consult Assessment of nutrition requirement/status, Diet education, Wound healing  ASSESSMENT:   Kathy Frank is a 42 yo F w/ PMH of HIV (CD4 450 12/2017) on descovy and tivicay, T2DM, htn, GERD, and CKD3 presenting with foot pain.  Pt admitted with sepsis related to lt foot cellulitis and 4th left toe osteomyelitis.   11/26- s/p Ultrasound-guided access, right femoral artery; Abdominal aortogram with CO2; Left lower extremity runoff; Third order catheterization; Angioplasty, left superficial femoral-popliteal artery; Mynx closure device  Per orthopedics note, plan for potential surgery on lt foot pending aortogram results.   Briefly spoke with pt, who reports feeling well today. Pt was ordering lunch at time of visit (requesting a hamburger). Noted meal completion 50-75%.   Reviewed wt hx; noted pt has experienced a 9.3% wt loss over the past 6 months, which while not significant for time frame, is concerning given poorly controlled DM and HIV. Pt would benefit from addition of nutritional supplements due to increased nutritional needs and fair appetite.   Last Hgb A1c: 7.9 (01/15/18). PTA DM medications are 0.2 ml victoza daily and 15 units insulin glargine daily.   Albumin has a half-life of 21 days and is strongly affected by stress response and  inflammatory process, therefore, do not expect to see an improvement in this lab value during acute hospitalization. When a patient presents with low albumin, it is likely skewed due to the acute inflammatory response. Note that low albumin is no longer used to diagnose malnutrition; [facility] uses the new malnutrition guidelines published by the American Society for Parenteral and Enteral Nutrition (A.S.P.E.N.) and the Academy of Nutrition and Dietetics (AND).    Labs reviewed: K: 5.2, Mg: 1.6, CBGS: 123-154 (inpatient orders for glycemic control are 0-9 units insulin aspart TID with meals and 8 units insulin glargine q HS).   NUTRITION - FOCUSED PHYSICAL EXAM:    Most Recent Value  Orbital Region  No depletion  Upper Arm Region  Mild depletion  Thoracic and Lumbar Region  No depletion  Buccal Region  No depletion  Temple Region  No depletion  Clavicle Bone Region  No depletion  Clavicle and Acromion Bone Region  No depletion  Scapular Bone Region  No depletion  Dorsal Hand  No depletion  Patellar Region  No depletion  Anterior Thigh Region  No depletion  Posterior Calf Region  No depletion  Edema (RD Assessment)  Mild  Hair  Reviewed  Eyes  Reviewed  Mouth  Reviewed  Skin  Reviewed  Nails  Reviewed       Diet Order:   Diet Order            Diet Carb Modified Fluid consistency: Thin; Room service appropriate? Yes  Diet effective now              EDUCATION NEEDS:  No education needs have been identified at this time  Skin:  Skin Assessment: Skin Integrity Issues: Skin Integrity Issues:: Incisions Incisions: lt foot cellulitis with 4th great toe osteomyelitis  Last BM:  07/05/18  Height:   Ht Readings from Last 1 Encounters:  07/02/18 5\' 5"  (1.651 m)    Weight:   Wt Readings from Last 1 Encounters:  07/02/18 71.2 kg    Ideal Body Weight:  56.8 kg  BMI:  Body mass index is 26.13 kg/m.  Estimated Nutritional Needs:   Kcal:  1800-2000  Protein:   90-105 grams  Fluid:  1.8-2.0 L    Jedi Catalfamo A. Jimmye Norman, RD, LDN, CDE Pager: [phone] After hours Pager: [phone]

## 2018-07-06 NOTE — Progress Notes (Signed)
   Subjective:  Feeling ok this morning, continuing to make urine, pain is controlled, no recurrence of nausea or vomiting.   Objective:  Vital signs in last 24 hours: Vitals:   07/05/18 1235 07/05/18 2037 07/06/18 0332 07/06/18 0454  BP: 140/80 (!) 149/87 139/88 (!) 151/87  Pulse: 84  86 85  Resp: 12 15 16  (!) 21  Temp: 98.2 F (36.8 C) 98.5 F (36.9 C) 99 F (37.2 C) 99.2 F (37.3 C)  TempSrc: Oral Oral Oral Oral  SpO2: 100% 100% 99% (!) 85%  Weight:      Height:       Physical Exam Constitution: NAD, lying supine in bed HEENT: no scleral icterus Cardio: RRR, no m/r/g Respiratory: CTAB, no wheezing or rales Neuro: a&o, quiet, cooperative, pleasant Skin: L. Foot wrapped with gauze, no strike through; scattered chronic plaques LE bilaterally     Assessment/Plan:  Principal Problem:   Osteomyelitis of foot, left, acute (HCC) Active Problems:   Essential hypertension   Human immunodeficiency virus (HIV) disease ([place])   AKI (acute kidney injury) ([place])   Acute on chronic anemia   Chronic kidney disease (CKD), stage IV (severe) ([place])  Kathy Frank is a 42yo female w/pmh of HIV (CD4 count 450 on 5/19 on descovy &tivicay), TIIDM (type I?), HTN, GERD, and CKD 3 who presented with sepsis 2/2 L foot cellulitis and fourth metatarsal osteomyelitis on MRI with AKI on CKD. GFR persistently decreased with likely progression of CKD. Othorpedics, vascular, infectious disease and nephrology consulted. Appreciate everyone's combined input.    Left foot cellulitis with Osteomyelitis 4th metatarsal Severe Peripheral Vascular Disease  S/p left superficial femoral angioplasty with vascular yesterday with significant improvement in pedal pulses. Seen by ID as well yesterday and discussed long term oral abx therapy if she is able to defer amputation of LLE. Patient doing well today and requesting food. Will await ortho recs.   - cont. Doxy, unasyn  - f/u ortho recommendations - attempted to  call office but may be closed for the holiday. Per phone answering service they were unable to forward my contact information to on call physician.  - compazine prn nausea   AKI with CKD Progression Non-gapped metabolic acidosis Continued acidosis, mildly hyperkalemic, hypomagnesemic. She is trending toward necessity of dialysis and both nephrology and vascular consulted. Vascular will plan to place AV graft and THD after clearance of infection. Switched HIV medication per ID from descovy to erudant to avoid potential nephrotoxicity of tenofovir.   - f/u with nephrology to determine necessity of temporary HD cath - repleting mag - daily RFP - cont. Bicarb 1300 mg bid   Acute on Chronic Anemia 2/2 CKD Stable, hemoglobin 7.2 << 7.2. Will transfuse for Hgb < 7. Labetalol 10 mg prn systolic > 323 or diastolic > 557  - am CBC   HTN norvasc 10 mg qd, labetalol 100 mg bid  HIV Switched from descovy to erudant per ID due to renal insufficiency. Cont. tivicay as well. Counseled by ID with change in medication.   - hold PPI with new drug therapy - cont. Juluca  VTE: lovenox IVF: none Diet: carb modified Code: full   Dispo: Anticipated discharge in approximately 3-4 days.   Kathy Heck, Kathy Frank 07/06/2018, 6:57 AM Pager: [phone]

## 2018-07-06 NOTE — Progress Notes (Signed)
Medicine attending: I examined this patient today together with resident physician Dr Molli Hazard and I concur with her evaluation and management plan. We appreciate input from consultants. Successful angioplasty of 3 areas of vascular compromise in the left superficial femoral artery yesterday.  Good Doppler flow in the foot post procedure.  Waiting to hear from orthopedic surgery whether they want to proceed with a amputation of the affected toe. Vein map show she does not have adequate veins for an AV fistula.  She will need an AV graft.  This will be deferred until treatment of infection is completed. She is not in immediate need for dialysis.  Changes in her HIV medications made to minimize potential nephrotoxicity related to them. She is maintaining borderline acid-base balance and potassium on oral bicarbonate. Nephrology recommends close monitoring at this time and defer placement of a vascular access catheter. Hemoglobin 7.2.  We would like to start an ESA.

## 2018-07-07 LAB — GLUCOSE, CAPILLARY
Glucose-Capillary: 115 mg/dL — ABNORMAL HIGH (ref 70–99)
Glucose-Capillary: 124 mg/dL — ABNORMAL HIGH (ref 70–99)
Glucose-Capillary: 146 mg/dL — ABNORMAL HIGH (ref 70–99)
Glucose-Capillary: 180 mg/dL — ABNORMAL HIGH (ref 70–99)

## 2018-07-07 LAB — RENAL FUNCTION PANEL
Albumin: 2.1 g/dL — ABNORMAL LOW (ref 3.5–5.0)
Anion gap: 8 (ref 5–15)
BUN: 38 mg/dL — ABNORMAL HIGH (ref 6–20)
CO2: 18 mmol/L — ABNORMAL LOW (ref 22–32)
Calcium: 8.2 mg/dL — ABNORMAL LOW (ref 8.9–10.3)
Chloride: 109 mmol/L (ref 98–111)
Creatinine, Ser: 5.62 mg/dL — ABNORMAL HIGH (ref 0.44–1.00)
GFR calc Af Amer: 10 mL/min — ABNORMAL LOW (ref >60)
GFR calc non Af Amer: 9 mL/min — ABNORMAL LOW (ref >60)
Glucose, Bld: 138 mg/dL — ABNORMAL HIGH (ref 70–99)
Phosphorus: 4.1 mg/dL (ref 2.5–4.6)
Potassium: 5.1 mmol/L (ref 3.5–5.1)
Sodium: 135 mmol/L (ref 135–145)

## 2018-07-07 LAB — CBC
HCT: 21.9 % — ABNORMAL LOW (ref 36.0–46.0)
HCT: 28.7 % — ABNORMAL LOW (ref 36.0–46.0)
Hemoglobin: 7 g/dL — ABNORMAL LOW (ref 12.0–15.0)
Hemoglobin: 9.4 g/dL — ABNORMAL LOW (ref 12.0–15.0)
MCH: 30.3 pg (ref 26.0–34.0)
MCH: 30.3 pg (ref 26.0–34.0)
MCHC: 32 g/dL (ref 30.0–36.0)
MCHC: 32.8 g/dL (ref 30.0–36.0)
MCV: 94.8 fL (ref 80.0–100.0)
MCV: 92.6 fL (ref 80.0–100.0)
Platelets: 340 10*3/uL (ref 150–400)
Platelets: 395 10*3/uL (ref 150–400)
RBC: 2.31 MIL/uL — ABNORMAL LOW (ref 3.87–5.11)
RBC: 3.1 MIL/uL — ABNORMAL LOW (ref 3.87–5.11)
RDW: 14.1 % (ref 11.5–15.5)
RDW: 14.7 % (ref 11.5–15.5)
WBC: 11.1 10*3/uL — ABNORMAL HIGH (ref 4.0–10.5)
WBC: 13.5 10*3/uL — ABNORMAL HIGH (ref 4.0–10.5)
nRBC: 0.2 % (ref 0.0–0.2)
nRBC: 0 % (ref 0.0–0.2)

## 2018-07-07 LAB — BPAM RBC
Blood Product Expiration Date: 201912062359
Blood Product Expiration Date: 201912062359
Blood Product Expiration Date: 201912062359
ISSUE DATE / TIME: 201911240436
Unit Type and Rh: 7300
Unit Type and Rh: 7300
Unit Type and Rh: 7300

## 2018-07-07 LAB — TYPE AND SCREEN
ABO/RH(D): B POS
Antibody Screen: NEGATIVE
Unit division: 0
Unit division: 0
Unit division: 0

## 2018-07-07 LAB — CULTURE, BLOOD (ROUTINE X 2)
Culture: NO GROWTH
Culture: NO GROWTH
Special Requests: ADEQUATE

## 2018-07-07 LAB — PREPARE RBC (CROSSMATCH)

## 2018-07-07 MED ORDER — CEFAZOLIN SODIUM-DEXTROSE 2-4 GM/100ML-% IV SOLN
2.0000 g | INTRAVENOUS | Status: DC
  Filled 2018-07-07 (×2): qty 100

## 2018-07-07 MED ORDER — DARBEPOETIN ALFA 40 MCG/0.4ML IJ SOSY: 40.0000 ug | PREFILLED_SYRINGE | Freq: Once | INTRAMUSCULAR | Status: DC

## 2018-07-07 MED ORDER — DARBEPOETIN ALFA 60 MCG/0.3ML IJ SOSY
60.0000 ug | PREFILLED_SYRINGE | Freq: Once | INTRAMUSCULAR | Status: AC
  Administered 2018-07-07: 60 ug via SUBCUTANEOUS
  Filled 2018-07-07: qty 0.3

## 2018-07-07 MED ORDER — CEFAZOLIN SODIUM-DEXTROSE 2-4 GM/100ML-% IV SOLN
2.0000 g | INTRAVENOUS | Status: DC
  Filled 2018-07-07: qty 100

## 2018-07-07 MED ORDER — SODIUM CHLORIDE 0.9% IV SOLUTION
Freq: Once | INTRAVENOUS | Status: AC
  Administered 2018-07-07: 13:00:00 via INTRAVENOUS

## 2018-07-07 MED ORDER — CHLORHEXIDINE GLUCONATE 4 % EX LIQD
60.0000 mL | Freq: Once | CUTANEOUS | Status: AC
  Administered 2018-07-08: 4 via TOPICAL
  Filled 2018-07-07: qty 15

## 2018-07-07 MED ORDER — POVIDONE-IODINE 10 % EX SWAB: 2.0000 "application " | Freq: Once | CUTANEOUS | Status: DC

## 2018-07-07 NOTE — Progress Notes (Signed)
Patient ID: Kathy Frank, female   DOB: [birth year], 42 y.o.   MRN: [id number] Patient is status post revascularization to the left lower extremity.  Her foot is warm she does have dry gangrenous changes of the fourth toe and will plan for a fourth ray amputation tomorrow Friday.

## 2018-07-07 NOTE — Progress Notes (Addendum)
Pharmacy note: aranesp  42 yo female with CKD and anemia. Pharmacy consulted to dose aranesp. She is on antibiotics for osteo. No plans for HD at this time.  -Hg= 7 -iron= 7 and % sat= 3 on 11/23  Plan -Aranesp 9mcg [place] every 4 weeks (first dose 11/28) -Consider IV iron once antibiotics are completed  Hildred Laser, PharmD Clinical Pharmacist **Pharmacist phone directory can now be found on [email or web address] ([email or web address]).  Listed under [place].

## 2018-07-07 NOTE — H&P (View-Only) (Signed)
Patient ID: Kathy Frank, female   DOB: Nov 12, 1975, 42 y.o.   MRN: [id number] Patient is status post revascularization to the left lower extremity.  Her foot is warm she does have dry gangrenous changes of the fourth toe and will plan for a fourth ray amputation tomorrow Friday.

## 2018-07-07 NOTE — Progress Notes (Signed)
   Subjective:  She states her foot is hurting this morning and she is having some cramping. She no longer has constipation and is having BM. Denies nausea, SOB.   Objective:  Vital signs in last 24 hours: Vitals:   07/06/18 0454 07/06/18 1413 07/06/18 2015 07/07/18 0235  BP: (!) 151/87 122/76 111/72 127/71  Pulse: 85 81 83 84  Resp: (!) 21 18 20 18   Temp: 99.2 F (37.3 C) 98.3 F (36.8 C) 98.2 F (36.8 C) 98.9 F (37.2 C)  TempSrc: Oral Oral Oral Oral  SpO2: (!) 85% 100% 96% 99%  Weight:      Height:       Physical Exam: Constitution: appears chronically ill, NAD Cardio: RRR, no m/r/g Respiratory: CTAB, no wheezing or rales Neuro: a&o, flat affect, cooperative Skin: scattered plaques LE bilaterally, extremities warm, LLE dressing intact, 4th metatarsal swollen without drainage   Assessment/Plan:  Principal Problem:   Osteomyelitis of foot, left, acute (HCC) Active Problems:   Essential hypertension   Human immunodeficiency virus (HIV) disease (HCC)   AKI (acute kidney injury) ([place])   Acute on chronic anemia   Chronic kidney disease (CKD), stage IV (severe) (HCC)  Kathy Frank is a 42yo female w/pmh of HIV (CD4 count 450 on 5/19 on descovy &tivicay), TIIDM (type I?), HTN, GERD, and CKD 3 who presented with sepsis 2/2 L foot cellulitis and fourth metatarsal osteomyelitis on MRI with AKI on CKD. GFR persistently decreased with likely progression of CKD. Othorpedics, infectious disease and nephrology consulted. Appreciate everyone's combined input.    Left foot cellulitis with Osteomyelitis 4th metatarsal Severe Peripheral Vascular Disease  On unasyn/doxy day 4. Ortho will plan for OR tomorrow for amputation of fourth metatarsal tomorrow.    - NPO midnight  - cont. abx - compazine prn nausea - am CBC  - dilaudid 1 mg q4h prn pain  CKD Stage V Non-gapped Metabolic Acidosis Acidosis lightly improved. She continues to make urine and is eating and drinking. Nephrology  consulted, no HD needed at this time.   - am RFP, Mg - bicarb 1300 mg bid  Acute on Chronic Anemia 2/2 CKD Hemoglobin 7 << 7.2 yesterday.   - transfuse 1U RBC in anticipation of surgery tomorrow with ortho - f/u H&H - start aranesp - defer iron until resolution of infection   HTN Controlled with current therapy  HIV Cont. tivicay & edurant (juluca)  VTE: SCDs IVF: none Diet: carb modified, NPO at midnight Code: full  Dispo: Anticipated discharge 2-4 days.    Kathy Heck, DO 07/07/2018, 6:45 AM Pager: [phone]

## 2018-07-07 NOTE — Progress Notes (Signed)
Pharmacy Antibiotic Note  Kathy Frank is a 42 y.o. female admitted with L toe osteomyelitis.  Pharmacy has been consulted for Unasyn and doxycycline.  ID following and will need 6 weeks antibiotics will possible po antibiotics at discharge.  -WBC= 11.1, afeb, SCr= 5.6, CrCl ~ 15 -no HD indicated per renal    Plan: -Continue Unasyn 3gm IV q12h and doxycycline -Will follow renal function and clinical progress    Height: 5\' 5"  (165.1 cm) Weight: 157 lb (71.2 kg) IBW/kg (Calculated) : 57  Temp (24hrs), Avg:98.5 F (36.9 C), Min:98.2 F (36.8 C), Max:98.9 F (37.2 C)  Recent Labs  Lab 07/02/18 2156 07/02/18 2330 07/02/18 2337  07/03/18 1505 07/04/18 0608 07/05/18 0507 07/06/18 0335 07/07/18 0309  WBC  --  12.5*  --   --   --  13.7* 12.1* 11.7* 11.1*  CREATININE  --  6.74*  --    < > 5.75* 5.98* 6.02* 5.56* 5.62*  LATICACIDVEN 1.03  --  0.52  --   --   --   --   --   --   VANCORANDOM  --   --   --   --   --  17  --   --   --    < > = values in this interval not displayed.    Estimated Creatinine Clearance: 13 mL/min (A) (by C-G formula based on SCr of 5.62 mg/dL (H)).    No Known Allergies  Hildred Laser, PharmD Clinical Pharmacist **Pharmacist phone directory can now be found on [email or web address] ([email or web address]).  Listed under [place].

## 2018-07-07 NOTE — Progress Notes (Signed)
Per Dr. Joelyn Oms, he does not want to proceed with vascular access at this time with catheter or AV graft.  Please call if the need changes.     Leontine Locket, [facility] 07/07/2018. 9:15 AM

## 2018-07-07 NOTE — Progress Notes (Signed)
Pharmacy Antibiotic Note  Kathy Frank is a 42 y.o. female admitted with L toe osteomyelitis.  Pharmacy has been consulted for Unasyn and doxycycline.  ID following and will need 6 weeks antibiotics will possible po antibiotics at discharge.  -WBC= 11.1, afeb, SCr= 5.6, CrCl ~ 15 -no HD indicated per renal    Plan: -Cefepime 1g IV q24h -Will follow renal function and clinical progress    Height: 5\' 5"  (165.1 cm) Weight: 157 lb (71.2 kg) IBW/kg (Calculated) : 57  Temp (24hrs), Avg:98.5 F (36.9 C), Min:98.2 F (36.8 C), Max:98.9 F (37.2 C)  Recent Labs  Lab 07/02/18 2156 07/02/18 2330 07/02/18 2337  07/03/18 1505 07/04/18 0608 07/05/18 0507 07/06/18 0335 07/07/18 0309  WBC  --  12.5*  --   --   --  13.7* 12.1* 11.7* 11.1*  CREATININE  --  6.74*  --    < > 5.75* 5.98* 6.02* 5.56* 5.62*  LATICACIDVEN 1.03  --  0.52  --   --   --   --   --   --   VANCORANDOM  --   --   --   --   --  17  --   --   --    < > = values in this interval not displayed.    Estimated Creatinine Clearance: 13 mL/min (A) (by C-G formula based on SCr of 5.62 mg/dL (H)).    No Known Allergies  Hildred Laser, PharmD Clinical Pharmacist **Pharmacist phone directory can now be found on [email or web address] ([email or web address]).  Listed under [place].

## 2018-07-07 NOTE — Progress Notes (Signed)
Admit: 07/02/2018 LOS: 55  42 year old female with renal failure, favor progressive CKD over acute on chronic renal insufficiency, normal anion gap metabolic acidosis, HIV on regimen including tenofovir, presenting with ulcer of left fourth toe and underlying osteomyelitis  Subjective:  . No overnight events . Stable renal function, potassium 5.1, bicarbonate 18 . Hemoglobin 7 . No fevers, blood pressure stable . Blood cultures no growth at 5 days, final . Patient denies nausea/vomiting, dysgeusia, anorexia . Unmeasured UOP   11/27 0701 - 11/28 0700 In: 600 [P.O.:600] Out: -   Filed Weights   07/02/18 2118  Weight: 71.2 kg    Scheduled Meds: . sodium chloride   Intravenous Once  . amLODipine  10 mg Oral Daily  . aspirin EC  81 mg Oral Daily  . citalopram  20 mg Oral Daily  . darbepoetin (ARANESP) injection - NON-DIALYSIS  40 mcg Subcutaneous Once  . dolutegravir  50 mg Oral Daily   And  . rilpivirine  25 mg Oral Q breakfast  . doxycycline  100 mg Oral Q12H  . feeding supplement (GLUCERNA SHAKE)  237 mL Oral QHS  . insulin aspart  0-9 Units Subcutaneous TID WC  . insulin glargine  8 Units Subcutaneous QHS  . labetalol  100 mg Oral BID  . multivitamin with minerals  1 tablet Oral Daily  . nutrition supplement (JUVEN)  1 packet Oral BID BM  . rosuvastatin  10 mg Oral q1800  . sodium bicarbonate  1,300 mg Oral BID  . sodium chloride flush  3 mL Intravenous Q12H   Continuous Infusions: . sodium chloride    . ampicillin-sulbactam (UNASYN) IV Stopped (07/07/18 1143)   PRN Meds:.sodium chloride, acetaminophen, HYDROmorphone (DILAUDID) injection, labetalol, ondansetron (ZOFRAN) IV, polyethylene glycol, prochlorperazine, senna-docusate, sodium chloride flush  Current Labs: reviewed    Physical Exam:  Blood pressure 127/71, pulse 84, temperature 98.9 F (37.2 C), temperature source Oral, resp. rate 18, height 5\' 5"  (1.651 m), weight 71.2 kg, last menstrual period 06/27/2018,  SpO2 99 %. GEN: No acute distress, tearful ENT: NCAT EYES: EOMI CV: RRR, 2 out of 6 midsystolic murmur best at left lower sternal border PULM: Clear bilaterally, normal work of breathing ABD: Soft, nontender SKIN: Left fourth digit ulcer evaluated EXT: Trace lower extremity edema  A 1. Renal failure, likely progressive CKD, now stage 5; not overtly uremic; prev seen once by Posey Pronto at [facility], not since 2017 2. Osteomyelitis of left fourth toe on Unasyn 3. PAD status post angioplasty of left lower extremity on 11/26 4. Diabetes mellitus 5. HIV, on ARV's, now off tenofovir 6. Normal anion gap metabolic acidosis; likely from tenofovir or and impaired ammoniagenesis related to CKD; improved on NaHCO3 7. Hypertension on amlodipine, labetalol 8. Anemia, iron deficient with iron saturation of 3%, also likely related to #1 and #2  P . No hemodialysis indicated, no overt uremia . Will give ESA today . Will need IV Fe in future very likley; not during active infection . Would not pursue vascular access at the current time, neither catheter nor AV graft; ideally she can heel and the infection can resolve prior to either procedure.  She is not uremic and does not need to start dialysis at the current time . No further suggestions at the current time, she will need to closely follow with our office; I will make arrangements . Daily weights, Daily Renal Panel, Strict I/Os, Avoid nephrotoxins (NSAIDs, judicious IV Contrast)     Pearson Grippe MD 07/07/2018, 11:45 AM  Recent Labs  Lab 07/04/18 0608 07/05/18 0507 07/06/18 0335 07/07/18 0309  NA 136 136 135 135  K 5.0 5.0 5.2* 5.1  CL 114* 113* 112* 109  CO2 13* 16* 15* 18*  GLUCOSE 136* 144* 180* 138*  BUN 43* 40* 39* 38*  CREATININE 5.98* 6.02* 5.56* 5.62*  CALCIUM 8.3* 8.0* 8.2* 8.2*  PHOS 4.3  --  4.6 4.1   Recent Labs  Lab 07/02/18 2330  07/05/18 0507 07/06/18 0335 07/07/18 0309  WBC 12.5*   < > 12.1* 11.7* 11.1*  NEUTROABS 10.2*   --   --   --   --   HGB 6.8*   < > 7.2* 7.2* 7.0*  HCT 22.7*   < > 22.8* 23.4* 21.9*  MCV 101.8*   < > 95.0 97.9 94.8  PLT 339   < > 351 356 340   < > = values in this interval not displayed.

## 2018-07-07 NOTE — Discharge Summary (Signed)
Name: Kathy Frank MRN: [id number] DOB: April 21, 1976 42 y.o. PCP: Lars Mage, MD  Date of Admission: 07/02/2018  9:11 PM Date of Discharge: 07/11/2018 01:00 PM Attending Physician: Gilles Chiquito, MD  Discharge Diagnosis: 1. 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 Peripheral Vascular Disease 3. TIIDM 4. CKD Stage V 5. Non-gapped metabolic Acidosis 6. Acute on Chronic Anemia 7. HIV   Discharge Medications: Allergies as of 07/11/2018   No Known Allergies     Medication List    STOP taking these medications   DESCOVY 200-25 MG tablet Generic drug:  emtricitabine-tenofovir AF   dolutegravir 50 MG tablet Commonly known as:  TIVICAY   emtricitabine-tenofovir AF 200-25 MG tablet Commonly known as:  DESCOVY   furosemide 40 MG tablet Commonly known as:  LASIX   metoCLOPramide 5 MG tablet Commonly known as:  REGLAN   ondansetron 4 MG disintegrating tablet Commonly known as:  ZOFRAN-ODT   TIVICAY 50 MG tablet Generic drug:  dolutegravir     TAKE these medications   amLODipine 10 MG tablet Commonly known as:  NORVASC Take 1 tablet (10 mg total) by mouth daily.   aspirin 81 MG EC tablet Take 1 tablet (81 mg total) by mouth daily. Start taking on:  07/12/2018   citalopram 20 MG tablet Commonly known as:  CELEXA Take 1 tablet (20 mg total) by mouth daily.   diclofenac sodium 1 % Gel Commonly known as:  VOLTAREN Apply 2 g topically 4 (four) times daily.   Dolutegravir-Rilpivirine 50-25 MG Tabs Take 1 tablet by mouth daily. Please take with largest meal of the day. Notes to patient:  Take with largest meal of the day   glucose blood test strip USE TO TEST BLOOD SUGAR 3 TIMES DAILY . Diagnosis code  E11.69, E66.9   Insulin Glargine 100 UNIT/ML Solostar Pen Commonly known as:  LANTUS Inject 15 Units into the skin daily at 10 pm. What changed:  when to take this   labetalol 100 MG tablet Commonly known as:  NORMODYNE Take 1  tablet (100 mg total) by mouth 2 (two) times daily.   liraglutide 18 MG/3ML Sopn Commonly known as:  VICTOZA Inject 0.2 mLs (1.2 mg total) into the skin daily.   multivitamin with minerals Tabs tablet Take 1 tablet by mouth daily.   oxyCODONE 5 MG immediate release tablet Commonly known as:  Oxy IR/ROXICODONE Take 1 tablet (5 mg total) by mouth every 4 (four) hours as needed for up to 4 days for moderate pain (pain score 4-6).   pantoprazole 40 MG tablet Commonly known as:  PROTONIX Take 1 tablet (40 mg total) by mouth daily.   sennosides-docusate sodium 8.6-50 MG tablet Commonly known as:  SENOKOT-S Take 1 tablet by mouth daily as needed for constipation.   sodium bicarbonate 650 MG tablet Take 1 tablet (650 mg total) by mouth 2 (two) times daily.   triamcinolone cream 0.1 % Commonly known as:  KENALOG Apply 1 application topically 2 (two) times daily.            Durable Medical Equipment  (From admission, onward)         Start     Ordered   07/11/18 0000  DME Other see comment    Comments:  Rolling Walker for home use   07/11/18 1154          Disposition and follow-up:   Kathy Frank was discharged from [facility] in Stable condition.  At the hospital follow up visit please address:  1. Sepsis 2/2 Osteomyelitis left 4th metatarsal with forefoot cellulitis: Treated with IV Unasyn and doxycycline for seven days, underwent amputation of L fourth metatarsal 11/29 after balloon angioplasty of LLE of superficial femoral artery.  3. TIIDM: diagnosed TIIDM but has been on insulin therapy since 42 years of age.  4. CKD Stage V: Previously CKD Stage IV prior to admission. GFR stable during admission but unchanged. Did not require dialysis but nephrology was consulted and will have f/u with her outpatient. Vein mapping done while admitted and will require AV graft when dialysis deemed necessary.  5. Non-gapped metabolic Acidosis 6. Acute on Chronic  Anemia: Chronic anemia 2/2 CKD. Received 2U RBC transfusion, once at admission for Hgb 6.8, again 11/28 for 7.0 and prior to surgery. Aranesp started q4 weeks, discharged with ferrous sulfate 325 mg qd.  7. HIV: HIV medications switched to Juluca per ID due to tenofovir CI with her worsened CKD. F/u with ID scheduled.   2.  Labs / imaging needed at time of follow-up: CBC, BMP  3.  Pending labs/ test needing follow-up: none  Follow-up Appointments: Follow-up [place] for Infectious Disease Follow up on 08/05/2018.   Specialty:  Infectious Diseases Why:  Appointment with Colletta Maryland, NP at 11:00 am. Kindly call to reschedule if you cannot make this appointment.  Contact information: [place], [place] [id number] [place]       Elmarie Shiley, MD Follow up.   Specialty:  Nephrology Why:  We will call with appt time and date Contact information: [place] [address] [phone]        [name], MD In 1 week.   Specialty:  Orthopedic Surgery Contact information: [place] [address] [phone]        [facility] In 4 weeks.   Specialty:  Vascular Surgery Why:  Office will call you to arrange your appt (sent) Contact information: [place] Follow up on 07/18/2018.   Why:  9:45am Contact information: [address] [place] Follow up.   Why:  rolling walker arranged- to be delivered to room prior to discharge Contact information: [address] [place] [address] [phone]           Hospital Course by problem list: Progression of CKD to Stage V Osteomyelitis Left 4th Metatarsal with Cellulitis Forefoot Ms. [name] is a  42yo female w/pmh of HIV (CD4 count 450 on 5/19 on descovy &tivicay), TIIDM (type I?), HTN, GERD, and CKD IV who presented with sepsis 2/2 L foot cellulitis and fourth metatarsal osteomyelitis on MRI, non-gapped metabolic acidosis, and AKI on CKD Stage IV. GFR remained persistently decreased throughout admission signifying progression of her CKD to stage V. She was placed on Unasyn and doxycycline due to vancomycin likely worsening her kidney function with resolution of symptoms of sepsis. Blood cultures were negative throughout her admission. Orthopedics and vascular were consulted, and ABI showed significant decreased perfusion to her LLE. Angioplasty was done with ballooning of the superficial left femoral artery and increased LE perfusion. 4th metatarsal amputation was done by Dr. Sharol Given on 11/29, and culture grew MSSA. She will follow-up with ortho after  discharge.  For persistence of her elevated creatinine and non-gapped metabolic acidosis nephrology was consulted. It was determined she did not need dialysis currently but would need follow-up with nephrology in the outpatient setting as her kidney function will likely continue to worsen. Her acidosis was treated with bicarb po.   Acute on Chronic Anemia 2/2 CKD She has chronic anemia due to her CKD and was admitted with hemoglobin of 6.8. Baseline previously was around 10. She received 1U RBC transfusion at admission and again on 11/28 prior to surgery for a hemoglobin of 7. She was started on aranesp during admission, which is dose monthly, and will be discharged with ferrous iron supplement. Per nephrology she also will likely require iron transfusions.   HIV ID consulted due to tenofovir being contraindicated in severe CKD. She was switched to [place] (erduvant and ticivay) and will follow-up with infectious disease.   Discharge Vitals:   BP (!) 121/100 (BP Location: Right Arm)   Pulse 72   Temp (!) 97.5 F (36.4 C) (Axillary)   Resp 11   Ht  5\' 5"  (1.651 m)   Wt 71.2 kg   LMP 06/27/2018   SpO2 100%   BMI 26.13 kg/m   Pertinent Labs, Studies, and Procedures:   Blood Cultures: Negative throughout admission   Deep Wound Culture: Pan-sensitive Staph Aureus  Peripheral Vascular Cath 11/26  - 3 lesions within the left superficial femoral artery all greater than 50% with 1 approximately 90%.  They were treated with a 5 mm balloon with residual stenosis less than 10%. - Two-vessel runoff via the anterior tibial and peroneal artery       Vas [place] ABI 11/25 Summary: Right: Resting right ankle-brachial index indicates mild right lower extremity arterial disease. The right toe-brachial index is abnormal.  Left: Resting left ankle-brachial index indicates moderate left lower extremity arterial disease. The left toe-brachial index is abnormal          Left Ankle MRI 07/03/2018 IMPRESSION: 1. Forefoot findings dictated separately. 2. No evidence of osteomyelitis within the left ankle, hindfoot or forefoot. 3. Generalized soft tissue edema, greatest laterally, likely cellulitis. No evidence of soft tissue abscess. 4. Intact ankle tendons and ligaments. Midfoot degenerative changes.  Electronically Signed   By: Richardean Sale M.D.   On: 07/03/2018 12:44  Left Foot MRI 07/03/2018 IMPRESSION: 1. Swelling of the 4th toe with suspected soft tissue emphysema and marrow changes suspicious for early osteomyelitis. 2. Generalized forefoot cellulitis without focal fluid collection. 3. Midfoot degenerative changes. 4. Ankle findings dictated separately.  Electronically Signed   By: Richardean Sale M.D.   On: 07/03/2018 12:45  Renal US 07/03/2018 IMPRESSION: No acute findings.  No hydronephrosis.  Electronically Signed   By: Franki Cabot M.D.   On: 07/03/2018 02:18  Left Foot XR 07/02/2018 IMPRESSION: No acute osseous injury of the left foot. Mild dorsal soft tissue swelling.  Electronically Signed   By: Ulyses Jarred M.D.   On: 07/02/2018 23:06  CXR 07/02/2018 IMPRESSION: No active cardiopulmonary disease. No evidence of pneumonia or pulmonary edema.  Electronically Signed   By: Franki Cabot M.D.   On: 07/02/2018 23:03  Discharge Instructions: Discharge Instructions    Call MD for:  persistant nausea and vomiting   Complete by:  As directed    Call MD for:  redness, tenderness, or signs of infection (pain, swelling, redness, odor or green/yellow discharge around incision site)   Complete by:  As directed    Call  MD for:  severe uncontrolled pain   Complete by:  As directed    Call MD for:  temperature >100.4   Complete by:  As directed    DME Other see comment   Complete by:  As directed    [place] for home use   Diet - low sodium heart healthy   Complete by:  As directed    Increase activity slowly   Complete by:  As directed    Leave dressing on - Keep it clean, dry, and intact until clinic visit   Complete by:  As directed      Mrs. Bangert  You came to [place] with signs of infection in your left foot. Our vascular surgeons put a stent in an artery in your leg to improve blood flow and our orthopedic surgeons amputated one of your toes to treat the source of infection. We have treated you with antibiotics and now we feel you are safe to be discharged. Here are our recommendations for you:  - Keep your wound dressing clean until your follow up appointment with you surgeon - Please start taking a baby aspirin 1 tablet every day - Please start taking Juluca 1 tabelet every day with a big meal - Please STOP taking Descovy  Thank you for choosing [facility]  Signed: Gilberto Better, MD 07/11/2018, 01:00 PM Pager: [phone]

## 2018-07-08 ENCOUNTER — Inpatient Hospital Stay (HOSPITAL_COMMUNITY): Payer: Medicaid Other | Admitting: Certified Registered Nurse Anesthetist

## 2018-07-08 ENCOUNTER — Encounter (HOSPITAL_COMMUNITY)
Admission: EM | Disposition: A | Discharge status: Home / Self Care | Payer: Self-pay | Source: Home / Self Care | Attending: Internal Medicine

## 2018-07-08 DIAGNOSIS — N184 Chronic kidney disease, stage 4 (severe): Secondary | ICD-10-CM

## 2018-07-08 DIAGNOSIS — D649 Anemia, unspecified: Secondary | ICD-10-CM

## 2018-07-08 DIAGNOSIS — Z89422 Acquired absence of other left toe(s): Secondary | ICD-10-CM

## 2018-07-08 HISTORY — PX: AMPUTATION: SHX166

## 2018-07-08 LAB — RENAL FUNCTION PANEL
Albumin: 2.1 g/dL — ABNORMAL LOW (ref 3.5–5.0)
Anion gap: 7 (ref 5–15)
BUN: 39 mg/dL — ABNORMAL HIGH (ref 6–20)
CO2: 19 mmol/L — ABNORMAL LOW (ref 22–32)
Calcium: 8.3 mg/dL — ABNORMAL LOW (ref 8.9–10.3)
Chloride: 108 mmol/L (ref 98–111)
Creatinine, Ser: 5.56 mg/dL — ABNORMAL HIGH (ref 0.44–1.00)
GFR calc Af Amer: 10 mL/min — ABNORMAL LOW (ref >60)
GFR calc non Af Amer: 9 mL/min — ABNORMAL LOW (ref >60)
Glucose, Bld: 102 mg/dL — ABNORMAL HIGH (ref 70–99)
Phosphorus: 4.2 mg/dL (ref 2.5–4.6)
Potassium: 5.1 mmol/L (ref 3.5–5.1)
Sodium: 134 mmol/L — ABNORMAL LOW (ref 135–145)

## 2018-07-08 LAB — TYPE AND SCREEN
ABO/RH(D): B POS
Antibody Screen: NEGATIVE
Unit division: 0

## 2018-07-08 LAB — CBC
HCT: 26.7 % — ABNORMAL LOW (ref 36.0–46.0)
Hemoglobin: 8.1 g/dL — ABNORMAL LOW (ref 12.0–15.0)
MCH: 28.6 pg (ref 26.0–34.0)
MCHC: 30.3 g/dL (ref 30.0–36.0)
MCV: 94.3 fL (ref 80.0–100.0)
Platelets: 371 10*3/uL (ref 150–400)
RBC: 2.83 MIL/uL — ABNORMAL LOW (ref 3.87–5.11)
RDW: 14.8 % (ref 11.5–15.5)
WBC: 11.1 10*3/uL — ABNORMAL HIGH (ref 4.0–10.5)
nRBC: 0 % (ref 0.0–0.2)

## 2018-07-08 LAB — BPAM RBC
Blood Product Expiration Date: 201912062359
ISSUE DATE / TIME: 201911281409
Unit Type and Rh: 7300

## 2018-07-08 LAB — GLUCOSE, CAPILLARY
Glucose-Capillary: 64 mg/dL — ABNORMAL LOW (ref 70–99)
Glucose-Capillary: 108 mg/dL — ABNORMAL HIGH (ref 70–99)
Glucose-Capillary: 96 mg/dL (ref 70–99)
Glucose-Capillary: 94 mg/dL (ref 70–99)
Glucose-Capillary: 160 mg/dL — ABNORMAL HIGH (ref 70–99)
Glucose-Capillary: 351 mg/dL — ABNORMAL HIGH (ref 70–99)
Glucose-Capillary: 202 mg/dL — ABNORMAL HIGH (ref 70–99)

## 2018-07-08 LAB — SURGICAL PCR SCREEN
MRSA, PCR: NEGATIVE
Staphylococcus aureus: NEGATIVE

## 2018-07-08 LAB — MAGNESIUM: Magnesium: 2 mg/dL (ref 1.7–2.4)

## 2018-07-08 SURGERY — AMPUTATION, FOOT, RAY
Anesthesia: General | Site: Foot | Laterality: Left

## 2018-07-08 MED ORDER — HYDROMORPHONE HCL 1 MG/ML IJ SOLN: 0.5000 mg | INTRAMUSCULAR | Status: DC | PRN

## 2018-07-08 MED ORDER — DOXYCYCLINE HYCLATE 100 MG PO TABS
100.0000 mg | ORAL_TABLET | Freq: Two times a day (BID) | ORAL | Status: AC
  Administered 2018-07-08 – 2018-07-10 (×6): 100 mg via ORAL
  Filled 2018-07-08 (×7): qty 1

## 2018-07-08 MED ORDER — PROMETHAZINE HCL 25 MG/ML IJ SOLN: 6.2500 mg | INTRAMUSCULAR | Status: DC | PRN

## 2018-07-08 MED ORDER — POLYETHYLENE GLYCOL 3350 17 G PO PACK: 17.0000 g | PACK | Freq: Every day | ORAL | Status: DC | PRN

## 2018-07-08 MED ORDER — FENTANYL CITRATE (PF) 100 MCG/2ML IJ SOLN
INTRAMUSCULAR | Status: AC
  Filled 2018-07-08: qty 2

## 2018-07-08 MED ORDER — SODIUM CHLORIDE 0.9 % IV SOLN
INTRAVENOUS | Status: DC
  Administered 2018-07-08: 10:00:00 via INTRAVENOUS

## 2018-07-08 MED ORDER — DOCUSATE SODIUM 100 MG PO CAPS
100.0000 mg | ORAL_CAPSULE | Freq: Two times a day (BID) | ORAL | Status: DC
  Administered 2018-07-08 – 2018-07-11 (×7): 100 mg via ORAL
  Filled 2018-07-08 (×7): qty 1

## 2018-07-08 MED ORDER — ONDANSETRON HCL 4 MG PO TABS: 4.0000 mg | ORAL_TABLET | Freq: Four times a day (QID) | ORAL | Status: DC | PRN

## 2018-07-08 MED ORDER — HEPARIN SODIUM (PORCINE) 5000 UNIT/ML IJ SOLN: 5000.0000 [IU] | Freq: Three times a day (TID) | INTRAMUSCULAR | Status: DC

## 2018-07-08 MED ORDER — MIDAZOLAM HCL 2 MG/2ML IJ SOLN
INTRAMUSCULAR | Status: DC | PRN
  Administered 2018-07-08: 1 mg via INTRAVENOUS

## 2018-07-08 MED ORDER — BISACODYL 10 MG RE SUPP: 10.0000 mg | Freq: Every day | RECTAL | Status: DC | PRN

## 2018-07-08 MED ORDER — METOCLOPRAMIDE HCL 5 MG PO TABS: 5.0000 mg | ORAL_TABLET | Freq: Three times a day (TID) | ORAL | Status: DC | PRN

## 2018-07-08 MED ORDER — FENTANYL CITRATE (PF) 100 MCG/2ML IJ SOLN
25.0000 ug | INTRAMUSCULAR | Status: DC | PRN
  Administered 2018-07-08 (×2): 50 ug via INTRAVENOUS

## 2018-07-08 MED ORDER — METHOCARBAMOL 1000 MG/10ML IJ SOLN
500.0000 mg | Freq: Four times a day (QID) | INTRAVENOUS | Status: DC | PRN
  Filled 2018-07-08: qty 5

## 2018-07-08 MED ORDER — PROPOFOL 10 MG/ML IV BOLUS
INTRAVENOUS | Status: DC | PRN
  Administered 2018-07-08 (×2): 50 mg via INTRAVENOUS
  Administered 2018-07-08: 100 mg via INTRAVENOUS

## 2018-07-08 MED ORDER — MIDAZOLAM HCL 2 MG/2ML IJ SOLN
INTRAMUSCULAR | Status: AC
  Filled 2018-07-08: qty 2

## 2018-07-08 MED ORDER — ONDANSETRON HCL 4 MG/2ML IJ SOLN
4.0000 mg | Freq: Four times a day (QID) | INTRAMUSCULAR | Status: DC | PRN
  Administered 2018-07-08 – 2018-07-11 (×5): 4 mg via INTRAVENOUS
  Filled 2018-07-08 (×5): qty 2

## 2018-07-08 MED ORDER — METOCLOPRAMIDE HCL 5 MG/ML IJ SOLN: 5.0000 mg | Freq: Three times a day (TID) | INTRAMUSCULAR | Status: DC | PRN

## 2018-07-08 MED ORDER — DEXAMETHASONE SODIUM PHOSPHATE 10 MG/ML IJ SOLN
INTRAMUSCULAR | Status: AC
  Filled 2018-07-08: qty 1

## 2018-07-08 MED ORDER — PHENYLEPHRINE HCL 10 MG/ML IJ SOLN
INTRAMUSCULAR | Status: DC | PRN
  Administered 2018-07-08: 120 ug via INTRAVENOUS
  Administered 2018-07-08: 80 ug via INTRAVENOUS

## 2018-07-08 MED ORDER — 0.9 % SODIUM CHLORIDE (POUR BTL) OPTIME
TOPICAL | Status: DC | PRN
  Administered 2018-07-08: 1000 mL

## 2018-07-08 MED ORDER — METHOCARBAMOL 500 MG PO TABS
500.0000 mg | ORAL_TABLET | Freq: Four times a day (QID) | ORAL | Status: DC | PRN
  Administered 2018-07-11: 500 mg via ORAL
  Filled 2018-07-08: qty 1

## 2018-07-08 MED ORDER — ONDANSETRON HCL 4 MG/2ML IJ SOLN
INTRAMUSCULAR | Status: DC | PRN
  Administered 2018-07-08: 4 mg via INTRAVENOUS

## 2018-07-08 MED ORDER — CEFAZOLIN SODIUM-DEXTROSE 1-4 GM/50ML-% IV SOLN: 1.0000 g | Freq: Four times a day (QID) | INTRAVENOUS | Status: DC

## 2018-07-08 MED ORDER — OXYCODONE HCL 5 MG PO TABS
10.0000 mg | ORAL_TABLET | ORAL | Status: DC | PRN
  Administered 2018-07-08 – 2018-07-11 (×15): 15 mg via ORAL
  Filled 2018-07-08 (×15): qty 3

## 2018-07-08 MED ORDER — FENTANYL CITRATE (PF) 250 MCG/5ML IJ SOLN
INTRAMUSCULAR | Status: DC | PRN
  Administered 2018-07-08: 25 ug via INTRAVENOUS

## 2018-07-08 MED ORDER — FENTANYL CITRATE (PF) 250 MCG/5ML IJ SOLN
INTRAMUSCULAR | Status: AC
  Filled 2018-07-08: qty 5

## 2018-07-08 MED ORDER — PROPOFOL 10 MG/ML IV BOLUS
INTRAVENOUS | Status: AC
  Filled 2018-07-08: qty 20

## 2018-07-08 MED ORDER — LIDOCAINE 2% (20 MG/ML) 5 ML SYRINGE
INTRAMUSCULAR | Status: DC | PRN
  Administered 2018-07-08: 60 mg via INTRAVENOUS

## 2018-07-08 MED ORDER — ONDANSETRON HCL 4 MG/2ML IJ SOLN
INTRAMUSCULAR | Status: AC
  Filled 2018-07-08: qty 2

## 2018-07-08 MED ORDER — MAGNESIUM CITRATE PO SOLN
1.0000 | Freq: Once | ORAL | Status: AC | PRN
  Administered 2018-07-10: 1 via ORAL
  Filled 2018-07-08: qty 296

## 2018-07-08 MED ORDER — OXYCODONE HCL 5 MG PO TABS: 5.0000 mg | ORAL_TABLET | ORAL | Status: DC | PRN

## 2018-07-08 MED ORDER — DEXAMETHASONE SODIUM PHOSPHATE 10 MG/ML IJ SOLN
INTRAMUSCULAR | Status: DC | PRN
  Administered 2018-07-08: 10 mg via INTRAVENOUS

## 2018-07-08 MED ORDER — ACETAMINOPHEN 325 MG PO TABS: 325.0000 mg | ORAL_TABLET | Freq: Four times a day (QID) | ORAL | Status: DC | PRN

## 2018-07-08 MED ORDER — CEFAZOLIN SODIUM-DEXTROSE 2-3 GM-%(50ML) IV SOLR
INTRAVENOUS | Status: DC | PRN
  Administered 2018-07-08: 2 g via INTRAVENOUS

## 2018-07-08 MED ORDER — DEXTROSE 50 % IV SOLN
INTRAVENOUS | Status: AC
  Administered 2018-07-08: 25 mL
  Filled 2018-07-08: qty 50

## 2018-07-08 MED ORDER — HEPARIN SODIUM (PORCINE) 5000 UNIT/ML IJ SOLN
5000.0000 [IU] | Freq: Three times a day (TID) | INTRAMUSCULAR | Status: DC
  Administered 2018-07-09 – 2018-07-11 (×7): 5000 [IU] via SUBCUTANEOUS
  Filled 2018-07-08 (×7): qty 1

## 2018-07-08 SURGICAL SUPPLY — 31 items
BLADE SAW SGTL MED 73X18.5 STR (BLADE) IMPLANT
BLADE SURG 21 STRL SS (BLADE) ×2 IMPLANT
BNDG COHESIVE 4X5 TAN STRL (GAUZE/BANDAGES/DRESSINGS) ×2 IMPLANT
BNDG COHESIVE 6X5 TAN STRL LF (GAUZE/BANDAGES/DRESSINGS) ×1 IMPLANT
BNDG GAUZE ELAST 4 BULKY (GAUZE/BANDAGES/DRESSINGS) ×2 IMPLANT
COVER SURGICAL LIGHT HANDLE (MISCELLANEOUS) ×4 IMPLANT
COVER WAND RF STERILE (DRAPES) ×2 IMPLANT
DRAPE U-SHAPE 47X51 STRL (DRAPES) ×4 IMPLANT
DRSG ADAPTIC 3X8 NADH LF (GAUZE/BANDAGES/DRESSINGS) ×2 IMPLANT
DRSG PAD ABDOMINAL 8X10 ST (GAUZE/BANDAGES/DRESSINGS) ×4 IMPLANT
DURAPREP 26ML APPLICATOR (WOUND CARE) ×2 IMPLANT
ELECT REM PT RETURN 9FT ADLT (ELECTROSURGICAL) ×2
ELECTRODE REM PT RTRN 9FT ADLT (ELECTROSURGICAL) ×1 IMPLANT
GAUZE SPONGE 4X4 12PLY STRL (GAUZE/BANDAGES/DRESSINGS) ×2 IMPLANT
GAUZE SPONGE 4X4 12PLY STRL LF (GAUZE/BANDAGES/DRESSINGS) ×1 IMPLANT
GLOVE BIOGEL PI IND STRL 9 (GLOVE) ×1 IMPLANT
GLOVE BIOGEL PI INDICATOR 9 (GLOVE) ×1
GLOVE SURG ORTHO 9.0 STRL STRW (GLOVE) ×2 IMPLANT
GOWN STRL REUS W/ TWL XL LVL3 (GOWN DISPOSABLE) ×2 IMPLANT
GOWN STRL REUS W/TWL XL LVL3 (GOWN DISPOSABLE) ×4
KIT BASIN OR (CUSTOM PROCEDURE TRAY) ×2 IMPLANT
KIT TURNOVER KIT B (KITS) ×2 IMPLANT
NS IRRIG 1000ML POUR BTL (IV SOLUTION) ×2 IMPLANT
PACK ORTHO EXTREMITY (CUSTOM PROCEDURE TRAY) ×2 IMPLANT
PAD ABD 8X10 STRL (GAUZE/BANDAGES/DRESSINGS) ×1 IMPLANT
PAD ARMBOARD 7.5X6 YLW CONV (MISCELLANEOUS) ×4 IMPLANT
STOCKINETTE IMPERVIOUS LG (DRAPES) IMPLANT
SUT ETHILON 2 0 PSLX (SUTURE) ×2 IMPLANT
TOWEL OR 17X26 10 PK STRL BLUE (TOWEL DISPOSABLE) ×2 IMPLANT
TUBE CONNECTING 12X1/4 (SUCTIONS) ×2 IMPLANT
YANKAUER SUCT BULB TIP NO VENT (SUCTIONS) ×2 IMPLANT

## 2018-07-08 NOTE — Anesthesia Procedure Notes (Signed)
Procedure Name: LMA Insertion Date/Time: 07/08/2018 8:01 AM Performed by: Bryson Corona, CRNA Pre-anesthesia Checklist: Patient identified, Emergency Drugs available, Suction available and Patient being monitored Patient Re-evaluated:Patient Re-evaluated prior to induction Oxygen Delivery Method: Circle System Utilized Preoxygenation: Pre-oxygenation with 100% oxygen Induction Type: IV induction Ventilation: Mask ventilation without difficulty LMA: LMA inserted LMA Size: 4.0 Number of attempts: 1 Placement Confirmation: positive ETCO2 Tube secured with: Tape Dental Injury: Teeth and Oropharynx as per pre-operative assessment

## 2018-07-08 NOTE — Progress Notes (Signed)
BS 64, npo since midnight with 8 units of Lantus 25 cc of d50 given pt asymptomatic will recheck in 15 min

## 2018-07-08 NOTE — Progress Notes (Addendum)
Pharmacy note: Unasyn and doxycycline  42 yo female with osteo of the left forth toe s/p amputation.  Spoke with Dr. Sharol Given. He would like to continue doxycycline and Unasyn for 72 hours post-op. Also consulted to begin heparin for VTE prophylaxis -hg= 8.1, plt= 371  Plan -End dates have been added to doxyxycline and Unasyn (last dose in pm on 12/1) -Will follow for any further dose adjustments -Begin heparin 5000 units heparin sq on 11/30  Hildred Laser, PharmD Clinical Pharmacist **Pharmacist phone directory can now be found on [email or web address] ([email or web address]).  Listed under [place].

## 2018-07-08 NOTE — Progress Notes (Signed)
Pt recevied from PACU. VSS. Drsg clean dry and intact. Telemetry applied. Breakfast tray ordered. Will continue to monitor.  Clyde Canterbury, RN

## 2018-07-08 NOTE — Transfer of Care (Signed)
Immediate Anesthesia Transfer of Care Note  Patient: Kathy Frank  Procedure(s) Performed: AMPUTATION FORTH RAY LEFT FOOT (Left Foot)  Patient Location: PACU  Anesthesia Type:General  Level of Consciousness: awake and alert   Airway & Oxygen Therapy: Patient Spontanous Breathing and Patient connected to nasal cannula oxygen  Post-op Assessment: Report given to RN and Post -op Vital signs reviewed and stable  Post vital signs: Reviewed and stable  Last Vitals:  Vitals Value Taken Time  BP 162/91 07/08/2018  8:32 AM  Temp    Pulse 78 07/08/2018  8:33 AM  Resp 10 07/08/2018  8:33 AM  SpO2 100 % 07/08/2018  8:33 AM  Vitals shown include unvalidated device data.  Last Pain:  Vitals:   07/08/18 0528  TempSrc: Oral  PainSc:       Patients Stated Pain Goal: 3 (81/38/87 1959)  Complications: No apparent anesthesia complications

## 2018-07-08 NOTE — Anesthesia Preprocedure Evaluation (Signed)
Anesthesia Evaluation  Patient identified by MRN, date of birth, ID band Patient awake    Reviewed: Allergy & Precautions, NPO status , Patient's Chart, lab work & pertinent test results  Airway Mallampati: II  TM Distance: >3 FB Neck ROM: Full    Dental no notable dental hx.    Pulmonary neg pulmonary ROS, former smoker,    Pulmonary exam normal breath sounds clear to auscultation       Cardiovascular hypertension, Normal cardiovascular exam Rhythm:Regular Rate:Normal     Neuro/Psych negative neurological ROS  negative psych ROS   GI/Hepatic Neg liver ROS, GERD  ,  Endo/Other  diabetes  Renal/GU Renal disease  negative genitourinary   Musculoskeletal negative musculoskeletal ROS (+)   Abdominal   Peds negative pediatric ROS (+)  Hematology  (+) anemia , HIV,   Anesthesia Other Findings   Reproductive/Obstetrics negative OB ROS                             Anesthesia Physical Anesthesia Plan  ASA: III  Anesthesia Plan: General   Post-op Pain Management:    Induction: Intravenous  PONV Risk Score and Plan: 2 and Ondansetron, Dexamethasone and Treatment may vary due to age or medical condition  Airway Management Planned: LMA  Additional Equipment:   Intra-op Plan:   Post-operative Plan: Extubation in OR  Informed Consent: I have reviewed the patients History and Physical, chart, labs and discussed the procedure including the risks, benefits and alternatives for the proposed anesthesia with the patient or authorized representative who has indicated his/her understanding and acceptance.   Dental advisory given  Plan Discussed with: CRNA and Surgeon  Anesthesia Plan Comments:         Anesthesia Quick Evaluation

## 2018-07-08 NOTE — Progress Notes (Signed)
Orthopedic Tech Progress Note Patient Details:  Kathy Frank Mar 18, 1976 [id number]  Ortho Devices Type of Ortho Device: [place] shoe Ortho Device/Splint Location: rle Ortho Device/Splint Interventions: Ordered, Application, Adjustment   Post Interventions Patient Tolerated: Well Instructions Provided: Care of device, Adjustment of device   [name] 07/08/2018, 11:10 AM

## 2018-07-08 NOTE — Op Note (Signed)
07/08/2018  8:28 AM  PATIENT:  Kathy Frank    PRE-OPERATIVE DIAGNOSIS:  gangrene left forth toe  POST-OPERATIVE DIAGNOSIS:  Same  PROCEDURE:  AMPUTATION FORTH RAY LEFT FOOT  SURGEON:  [name], MD  PHYSICIAN ASSISTANT:None ANESTHESIA:   General  PREOPERATIVE INDICATIONS:  Kathy Frank is a  42 y.o. female with a diagnosis of gangrene left forth toe who failed conservative measures and elected for surgical management.    The risks benefits and alternatives were discussed with the patient preoperatively including but not limited to the risks of infection, bleeding, nerve injury, cardiopulmonary complications, the need for revision surgery, among others, and the patient was willing to proceed.  OPERATIVE IMPLANTS: None.  @ENCIMAGES @  OPERATIVE FINDINGS: Abscess at the MTP joint.  Tissue sent for cultures.  OPERATIVE PROCEDURE: Patient was brought the operating room and underwent a general anesthetic.  After adequate levels anesthesia were obtained patient's left lower extremity was prepped using DuraPrep draped into a sterile field a timeout was called.  A V incision was made along the fourth ray.  There was a purulent abscess the toe was sent for cultures.  A fourth ray resection was made through the base of the fourth metatarsal.  Soft tissue was removed with a rondure your that was involved with the infection this was further debrided with a 10 blade knife.  The wound was irrigated with normal saline there is minimal petechial bleeding.  Local tissue rearrangement was used to close the wound.  2-0 nylon was used.  A sterile dressing was applied patient was extubated taken the PACU in stable condition.   DISCHARGE PLANNING:  Antibiotic duration: Continue antibiotics for 72 hours  Weightbearing: Nonweightbearing on the left  Pain medication: Opioid pathway ordered  Dressing care/ Wound VAC: Keep dressing clean dry and intact  Ambulatory devices: Walker  Discharge  to: Home  Follow-up: In the office 1 week post operative.

## 2018-07-08 NOTE — Progress Notes (Signed)
Subjective: No new complaints   Antibiotics:  Anti-infectives (From admission, onward)   Start     Dose/Rate Route Frequency Ordered Stop   07/08/18 1015  doxycycline (VIBRA-TABS) tablet 100 mg     100 mg Oral Every 12 hours 07/08/18 1006 07/11/18 0959   07/08/18 0945  ceFAZolin (ANCEF) IVPB 1 g/50 mL premix  Status:  Discontinued     1 g 100 mL/hr over 30 Minutes Intravenous Every 6 hours 07/08/18 0934 07/08/18 1003   07/08/18 0600  ceFAZolin (ANCEF) IVPB 2g/100 mL premix  Status:  Discontinued     2 g 200 mL/hr over 30 Minutes Intravenous To Surgery 07/07/18 1629 07/08/18 0930   07/08/18 0000  ceFAZolin (ANCEF) IVPB 2g/100 mL premix  Status:  Discontinued     2 g 200 mL/hr over 30 Minutes Intravenous To Surgery 07/07/18 1624 07/07/18 1629   07/05/18 1200  dolutegravir (TIVICAY) tablet 50 mg     50 mg Oral Daily 07/05/18 0946     07/05/18 1200  rilpivirine (EDURANT) tablet 25 mg     25 mg Oral Daily with breakfast 07/05/18 0946     07/05/18 0030  vancomycin (VANCOCIN) IVPB 1000 mg/200 mL premix  Status:  Discontinued     1,000 mg 200 mL/hr over 60 Minutes Intravenous Every 48 hours 07/04/18 1054 07/04/18 1149   07/05/18 0000  Dolutegravir-Rilpivirine (JULUCA) 50-25 MG TABS     1 tablet Oral Daily 07/05/18 1309     07/04/18 2000  doxycycline (VIBRA-TABS) tablet 100 mg  Status:  Discontinued     100 mg Oral Every 12 hours 07/04/18 1156 07/08/18 1006   07/04/18 1200  Ampicillin-Sulbactam (UNASYN) 3 g in sodium chloride 0.9 % 100 mL IVPB     3 g 200 mL/hr over 30 Minutes Intravenous Every 12 hours 07/04/18 1155 07/11/18 1006   07/03/18 2200  ceFEPIme (MAXIPIME) 1 g in sodium chloride 0.9 % 100 mL IVPB  Status:  Discontinued     1 g 200 mL/hr over 30 Minutes Intravenous Every 24 hours 07/03/18 0124 07/04/18 1149   07/03/18 1000  emtricitabine-tenofovir AF (DESCOVY) 200-25 MG per tablet 1 tablet  Status:  Discontinued     1 tablet Oral Daily 07/03/18 0152 07/05/18 0943   07/03/18 1000  dolutegravir (TIVICAY) tablet 50 mg  Status:  Discontinued     50 mg Oral Daily 07/03/18 0152 07/05/18 0943   07/03/18 0834  vancomycin variable dose per unstable renal function (pharmacist dosing)  Status:  Discontinued      Does not apply See admin instructions 07/03/18 0834 07/04/18 1054   07/02/18 2145  vancomycin (VANCOCIN) 1,500 mg in sodium chloride 0.9 % 500 mL IVPB     1,500 mg 250 mL/hr over 120 Minutes Intravenous  Once 07/02/18 2136 07/03/18 0225   07/02/18 2145  ceFEPIme (MAXIPIME) 2 g in sodium chloride 0.9 % 100 mL IVPB     2 g 200 mL/hr over 30 Minutes Intravenous  Once 07/02/18 2136 07/03/18 0022      Medications: Scheduled Meds: . amLODipine  10 mg Oral Daily  . aspirin EC  81 mg Oral Daily  . citalopram  20 mg Oral Daily  . docusate sodium  100 mg Oral BID  . dolutegravir  50 mg Oral Daily   And  . rilpivirine  25 mg Oral Q breakfast  . doxycycline  100 mg Oral Q12H  . feeding supplement (GLUCERNA SHAKE)  237 mL Oral  QHS  . fentaNYL      . [START ON 07/09/2018] heparin injection (subcutaneous)  5,000 Units Subcutaneous Q8H  . insulin aspart  0-9 Units Subcutaneous TID WC  . insulin glargine  8 Units Subcutaneous QHS  . labetalol  100 mg Oral BID  . multivitamin with minerals  1 tablet Oral Daily  . nutrition supplement (JUVEN)  1 packet Oral BID BM  . rosuvastatin  10 mg Oral q1800  . sodium bicarbonate  1,300 mg Oral BID   Continuous Infusions: . sodium chloride 10 mL/hr at 07/08/18 0939  . ampicillin-sulbactam (UNASYN) IV 3 g (07/08/18 1237)  . methocarbamol (ROBAXIN) IV     PRN Meds:.acetaminophen, bisacodyl, HYDROmorphone (DILAUDID) injection, labetalol, magnesium citrate, methocarbamol **OR** methocarbamol (ROBAXIN) IV, metoCLOPramide **OR** metoCLOPramide (REGLAN) injection, ondansetron **OR** ondansetron (ZOFRAN) IV, oxyCODONE, oxyCODONE, polyethylene glycol, prochlorperazine, senna-docusate, sodium chloride  flush    Objective: Weight change:   Intake/Output Summary (Last 24 hours) at 07/08/2018 1319 Last data filed at 07/08/2018 1054 Gross per 24 hour  Intake 1410 ml  Output 100 ml  Net 1310 ml   Blood pressure (!) 157/84, pulse 80, temperature 98.2 F (36.8 C), temperature source Oral, resp. rate (!) 23, height 5\' 5"  (1.651 m), weight 71.2 kg, last menstrual period 06/27/2018, SpO2 97 %. Temp:  [97.9 F (36.6 C)-98.6 F (37 C)] 98.2 F (36.8 C) (11/29 1237) Pulse Rate:  [76-86] 80 (11/29 1237) Resp:  [12-23] 23 (11/29 1237) BP: (130-174)/(74-99) 157/84 (11/29 1237) SpO2:  [90 %-100 %] 97 % (11/29 1237)  Physical Exam: General: Alert and awake, oriented x3, not in any acute distress. HEENT: anicteric sclera, EOMI CVS regular rate, normal  Chest: , no wheezing, no respiratory distress Abdomen: soft non-distended,  Extremities: no edema or deformity noted bilaterally Skin: Patient site not examined Neuro: nonfocal  CBC:    BMET Recent Labs    07/07/18 0309 07/08/18 0353  NA 135 134*  K 5.1 5.1  CL 109 108  CO2 18* 19*  GLUCOSE 138* 102*  BUN 38* 39*  CREATININE 5.62* 5.56*  CALCIUM 8.2* 8.3*     Liver Panel  Recent Labs    07/07/18 0309 07/08/18 0353  ALBUMIN 2.1* 2.1*       Sedimentation Rate No results for input(s): ESRSEDRATE in the last 72 hours. C-Reactive Protein No results for input(s): CRP in the last 72 hours.  Micro Results: Recent Results (from the past 720 hour(s))  Blood Culture (routine x 2)     Status: None   Collection Time: 07/02/18  9:49 PM  Result Value Ref Range Status   Specimen Description BLOOD LEFT ANTECUBITAL  Final   Special Requests   Final    BOTTLES DRAWN AEROBIC AND ANAEROBIC Blood Culture results may not be optimal due to an inadequate volume of blood received in culture bottles   Culture   Final    NO GROWTH 5 DAYS Performed at [facility], [place] [address] [place], [place] [address]    Report Status  07/07/2018 FINAL  Final  Blood Culture (routine x 2)     Status: None   Collection Time: 07/02/18 11:35 PM  Result Value Ref Range Status   Specimen Description BLOOD LEFT HAND  Final   Special Requests   Final    BOTTLES DRAWN AEROBIC AND ANAEROBIC Blood Culture adequate volume   Culture   Final    NO GROWTH 5 DAYS Performed at [facility], [place] [address],  [place], [place] [address]    Report Status 07/07/2018 FINAL  Final  Surgical pcr screen     Status: None   Collection Time: 07/08/18 12:34 AM  Result Value Ref Range Status   MRSA, PCR NEGATIVE NEGATIVE Final   Staphylococcus aureus NEGATIVE NEGATIVE Final    Comment: (NOTE) The Xpert SA Assay (FDA approved for NASAL specimens in patients 33 years of age and older), is one component of a comprehensive surveillance program. It is not intended to diagnose infection nor to guide or monitor treatment. Performed at [facility], [place] [address] [place], [place] [address]     Studies/Results: No results found.    Assessment/Plan:  INTERVAL HISTORY: Status post irritation of her fourth toe   Principal Problem:   Osteomyelitis of foot, left, acute (HCC) Active Problems:   Essential hypertension   Human immunodeficiency virus (HIV) disease (HCC)   AKI (acute kidney injury) ([place])   Acute on chronic anemia   Chronic kidney disease (CKD), stage IV (severe) ([place])    Kathy Frank is a 43 y.o. female with told HIV who presented to the hospital with a septic picture due to gangrene in her left toe with acute renal failure.  She is now status post left fourth ray amputation which should cure her soft source of sepsis  #1 gangrene and osteomyelitis of fourth toe status post amputation: Fine to continue antibiotics as per Dr. Jess Barters wishes for 72 hours but then discontinue them she should not need for other antibiotics provided the margins were clear  #2 acute renal failure: And is holding steady.  I doubt the TAF  had anything to do with her renal failure but we are now using a tenofovir or sparing regimen  HIV disease: Very well controlled she will continue on JULUCA.  She understands she needs to take this with a chewable meal every day.  She also knows that she needs to avoid all proton pump inhibitors H2 blockers or things that would block acid secretion in the stomach.  I will add these to her allergies as contraindicated medications.  She should follow-up in our clinic in the next month to have a repeat viral load since we have changed her regimen I praised her for a high level of adherence.  I would also make sure that she has medications in hand when she leaves would preferably fill her medications through our pharmacy and deliver them to the bedside if possible she otherwise normally gets them from a local pharmacy and she does have Medicaid.  Will sign off for now please call back with further questions.   LOS: 5 days   Alcide Evener 07/08/2018, 1:19 PM

## 2018-07-08 NOTE — Anesthesia Postprocedure Evaluation (Signed)
Anesthesia Post Note  Patient: Kathy Frank  Procedure(s) Performed: AMPUTATION FORTH RAY LEFT FOOT (Left Foot)     Patient location during evaluation: PACU Anesthesia Type: General Level of consciousness: awake and alert Pain management: pain level controlled Vital Signs Assessment: post-procedure vital signs reviewed and stable Respiratory status: spontaneous breathing, nonlabored ventilation, respiratory function stable and patient connected to nasal cannula oxygen Cardiovascular status: blood pressure returned to baseline and stable Postop Assessment: no apparent nausea or vomiting Anesthetic complications: no    Last Vitals:  Vitals:   07/08/18 0528 07/08/18 0832  BP: (!) 157/94 (!) 162/91  Pulse: 77 77  Resp: 18 15  Temp: 36.8 C 36.7 C  SpO2: 100% 100%    Last Pain:  Vitals:   07/08/18 0832  TempSrc:   PainSc: Asleep    LLE Motor Response: Purposeful movement (07/08/18 0832) LLE Sensation: Full sensation (07/08/18 0832) RLE Motor Response: Purposeful movement (07/08/18 0832) RLE Sensation: Full sensation (07/08/18 0832)      Myrtie Soman S

## 2018-07-08 NOTE — Progress Notes (Signed)
   Subjective:  She is feeling well after her surgery, pain is controlled. No SOB, she has urinated since surgery. We discussed how to prevent recurrence of infection in her feet in the future with her diabetes.   Objective:  Vital signs in last 24 hours: Vitals:   07/07/18 1435 07/07/18 1710 07/07/18 1925 07/08/18 0528  BP: 130/77 (!) 143/86 (!) 149/84 (!) 157/94  Pulse: 79 76 83 77  Resp: 14 12 13 18   Temp: 98.2 F (36.8 C) 98.6 F (37 C) 98.1 F (36.7 C) 98.3 F (36.8 C)  TempSrc: Oral Oral Oral Oral  SpO2: 99% 90% 91% 100%  Weight:      Height:       Physical Exam: Constitution: NAD, sitting up in bed eating Cardio: RRR, no m/r/g Respiratory: CTAB, no w/r/r MSK: moving all extremities Neuro: a&o, normal affect, pleasant Skin: bilateral scattered plaques LEs, dressings in place over LLE with no strike through   Assessment/Plan:  Principal Problem:   Osteomyelitis of foot, left, acute (HCC) Active Problems:   Essential hypertension   Human immunodeficiency virus (HIV) disease (HCC)   AKI (acute kidney injury) ([place])   Acute on chronic anemia   Chronic kidney disease (CKD), stage IV (severe) (HCC)  Kathy Frank is a 42yo female w/pmh of HIV (CD4 count 450 on 5/19 on descovy &tivicay), TIIDM (type I?), HTN, GERD, and CKD 3 who presented with sepsis 2/2 L foot cellulitis and fourth metatarsal osteomyelitis on MRI with AKI on CKD. GFR persistently decreased with likely progression of CKD.   Left Foot cellulitis with Osteomyelitis 4th metatarsal  Severe Peripheral Vascular Disease Doing well s/p amputation 4th metatarsal for osteomyelitis. This is day 7 of antibiotic therapy, and we will continue post-operatively as well as for cellulitis but will consider switching to PO tomorrow as signs of infection resolved in forefoot of LLE and no bacterial growth on blood cultures.   - cont. Unasyn, doxycycline  - PT/OT - dilaudid and oxycodone prn pain s/p surgery - am BMP,  CBC  CKD Stage V Non-gapped metabolic acidosis GFR and creatinine stable at 10 and 5.5 respectively. She is continues to make urine and has no signs of volume overload. Acidosis continuing to resolve. Will f/u with nephrology outpatient  - am BMP  Acute on Chronic Anemia 2/2 CKD S/p RBC 1U transfusion yesterday for surgery today with hemoglobin 7 yesterday. Hgb today 8.1.   - am CBC  - will start iron supplement at discharge - aranesp started yesterday  HIV Cont. juluca  TIIDM Controlled on current regimen   VTE: heparin IVF: none Diet: renal carb modified Code: full   Dispo: Anticipated discharge in approximately 1-2 days.   Molli Hazard A, DO 07/08/2018, 7:11 AM Pager: [phone]

## 2018-07-08 NOTE — Interval H&P Note (Signed)
History and Physical Interval Note:  07/08/2018 7:48 AM  Kathy Frank  has presented today for surgery, with the diagnosis of gangrene left forth toe  The various methods of treatment have been discussed with the patient and family. After consideration of risks, benefits and other options for treatment, the patient has consented to  Procedure(s): AMPUTATION FORTH RAY LEFT FOOT (Left) as a surgical intervention .  The patient's history has been reviewed, patient examined, no change in status, stable for surgery.  I have reviewed the patient's chart and labs.  Questions were answered to the patient's satisfaction.     [name]

## 2018-07-08 NOTE — Progress Notes (Signed)
RN verified the presence of a signed informed consent that matches stated procedure by patient. Verified armband matches patient's stated name and birth date. Verified NPO status and that all jewelry, contact, glasses, dentures, and partials had been removed (if applicable).  

## 2018-07-09 ENCOUNTER — Encounter (HOSPITAL_COMMUNITY): Payer: Self-pay | Admitting: Orthopedic Surgery

## 2018-07-09 LAB — RENAL FUNCTION PANEL
Albumin: 2.2 g/dL — ABNORMAL LOW (ref 3.5–5.0)
Anion gap: 12 (ref 5–15)
BUN: 51 mg/dL — ABNORMAL HIGH (ref 6–20)
CO2: 21 mmol/L — ABNORMAL LOW (ref 22–32)
Calcium: 8.1 mg/dL — ABNORMAL LOW (ref 8.9–10.3)
Chloride: 101 mmol/L (ref 98–111)
Creatinine, Ser: 5.83 mg/dL — ABNORMAL HIGH (ref 0.44–1.00)
GFR calc Af Amer: 10 mL/min — ABNORMAL LOW (ref >60)
GFR calc non Af Amer: 8 mL/min — ABNORMAL LOW (ref >60)
Glucose, Bld: 211 mg/dL — ABNORMAL HIGH (ref 70–99)
Phosphorus: 3.7 mg/dL (ref 2.5–4.6)
Potassium: 5.3 mmol/L — ABNORMAL HIGH (ref 3.5–5.1)
Sodium: 134 mmol/L — ABNORMAL LOW (ref 135–145)

## 2018-07-09 LAB — CBC
HCT: 24.9 % — ABNORMAL LOW (ref 36.0–46.0)
Hemoglobin: 8.2 g/dL — ABNORMAL LOW (ref 12.0–15.0)
MCH: 30.3 pg (ref 26.0–34.0)
MCHC: 32.9 g/dL (ref 30.0–36.0)
MCV: 91.9 fL (ref 80.0–100.0)
Platelets: 388 10*3/uL (ref 150–400)
RBC: 2.71 MIL/uL — ABNORMAL LOW (ref 3.87–5.11)
RDW: 14.2 % (ref 11.5–15.5)
WBC: 16.6 10*3/uL — ABNORMAL HIGH (ref 4.0–10.5)
nRBC: 0 % (ref 0.0–0.2)

## 2018-07-09 LAB — GLUCOSE, CAPILLARY
Glucose-Capillary: 151 mg/dL — ABNORMAL HIGH (ref 70–99)
Glucose-Capillary: 112 mg/dL — ABNORMAL HIGH (ref 70–99)
Glucose-Capillary: 119 mg/dL — ABNORMAL HIGH (ref 70–99)
Glucose-Capillary: 140 mg/dL — ABNORMAL HIGH (ref 70–99)

## 2018-07-09 MED ORDER — ALUM & MAG HYDROXIDE-SIMETH 200-200-20 MG/5ML PO SUSP
30.0000 mL | ORAL | Status: DC | PRN
  Administered 2018-07-09 – 2018-07-10 (×2): 30 mL via ORAL
  Filled 2018-07-09 (×2): qty 30

## 2018-07-09 MED ORDER — LABETALOL HCL 200 MG PO TABS
200.0000 mg | ORAL_TABLET | Freq: Two times a day (BID) | ORAL | Status: DC
  Administered 2018-07-09 – 2018-07-11 (×5): 200 mg via ORAL
  Filled 2018-07-09 (×5): qty 1

## 2018-07-09 NOTE — Progress Notes (Signed)
   Subjective:  She is feeling well this morning, some pain in her leg. She is having heartburn this morning but only when she lies back. Otherwise no SOB. She states she is urinating well.   Objective:  Vital signs in last 24 hours: Vitals:   07/08/18 0932 07/08/18 1142 07/08/18 1237 07/08/18 2151  BP: (!) 168/99 (!) 146/74 (!) 157/84 (!) 162/85  Pulse: 77 86 80 87  Resp:  14 (!) 23   Temp: 98 F (36.7 C) 98.2 F (36.8 C) 98.2 F (36.8 C) 98.4 F (36.9 C)  TempSrc: Oral Oral Oral Oral  SpO2: 97% 98% 97% 97%  Weight:      Height:       Physical Exam: Constitution: NAD, sitting up in bed Cardio: RRR, no m/r/g, no edema Respiratory: CTAB, no wheezing or rales MSK: moving all extremities, sensation LE b/l intact Neuro: a&o, normal affect, cooperative Skin: bilateral scattered plaques LEs, dressings in place over LLE with no strike through   Assessment/Plan:  Principal Problem:   Osteomyelitis of foot, left, acute (HCC) Active Problems:   Essential hypertension   Human immunodeficiency virus (HIV) disease (HCC)   AKI (acute kidney injury) ([place])   Acute on chronic anemia   Chronic kidney disease (CKD), stage IV (severe) (HCC)  Kathy Frank is a 42yo female w/pmh of HIV (CD4 count 450 on 5/19 on descovy &tivicay), TIIDM (type I?), HTN, GERD, and CKD 3 who presented with sepsis 2/2 L foot cellulitis and fourth metatarsal osteomyelitis on MRI with AKI on CKD. GFR persistently decreased with likely progression of CKD to stage V. She had left angioplasty of L superficial femoral artery last week with amputation of 4th left metatarsal on 11/28.    Left Foot cellulitis with Osteomyelitis 4th metatarsal s/p amputation metatarsal Severe Peripheral Vascular Disease WBC spike, likely 2/2 surgery two days ago. No fever, SOB, dysuria. Mild pain in LLE, has pain medication available. We will continue antibiotics for two more days. Wound cultures still pending but grew rare gram + cocci. She  is otherwise stable for discharge once antibiotics are complete.  - cont. Unasyn, doxycycline - PT/OT consulted - will need to order rolling walker with 5" wheels  - oxycodone pain prn  - PT/OT - am BMP, CBC   CKD Stage V Non-gapped metabolic acidosis Previous baseline Cr ~2, consistently at 5-6 since admission. Mildly hyperkalemic today, will continue to monitor. No necessary dialysis currently but expected to progress eventually, vein mapping done by vascular during angioplasty and she will need an AV graft. Nephrology signed off and will follow with her in the outpatient setting.   - am BMP  - cont. NaBicarb 1300 mg bid   Hypertension Blood pressure consistently elevated. She is on norvasc 10 mg qd and labetalol 200 mg qd from home   - increase to labetalol 200 mg bid  VTE: heparin IVF: none Diet: carb modified Code: full  Dispo: Anticipated discharge in approximately 2 days.   Molli Hazard A, DO 07/09/2018, 7:51 AM Pager: [phone]

## 2018-07-09 NOTE — Evaluation (Signed)
Physical Therapy Evaluation Patient Details Name: Kathy Frank MRN: [id number] DOB: Dec 18, 1975 Today's Date: 07/09/2018   History of Present Illness  Patient is a 42 yo female admitted with Sepsis 2/2 Osteomyelitis left 4th metatarsal with forefoot cellulitis: underwent amputation of L fourth metatarsal 11/29 after balloon angioplasty of LLE of superficial femoral   Clinical Impression  Orders received for PT evaluation. Patient demonstrates deficits in functional mobility as indicated below. Will benefit from continued skilled PT to address deficits and maximize function. Will see as indicated and progress as tolerated.      Follow Up Recommendations No PT follow up;Supervision - Intermittent    Equipment Recommendations  Rolling walker with 5" wheels    Recommendations for Other Services       Precautions / Restrictions Precautions Precautions: Fall Restrictions Weight Bearing Restrictions: Yes LLE Weight Bearing: Non weight bearing      Mobility  Bed Mobility Overal bed mobility: Independent             General bed mobility comments: no physical assist required  Transfers Overall transfer level: Independent Equipment used: None                Ambulation/Gait Ambulation/Gait assistance: Supervision Gait Distance (Feet): 80 Feet Assistive device: Rolling walker (2 wheeled) Gait Pattern/deviations: Step-to pattern Gait velocity: decreased Gait velocity interpretation: <1.8 ft/sec, indicate of risk for recurrent falls General Gait Details: Hope to tecnique for NWBing compliance  Stairs Stairs: (deferred to next session)          Wheelchair Mobility    Modified Rankin (Stroke Patients Only)       Balance Overall balance assessment: Mild deficits observed, not formally tested                                           Pertinent Vitals/Pain Pain Assessment: 0-10 Pain Score: 8  Pain Location: left foot Pain Descriptors /  Indicators: Sore Pain Intervention(s): Monitored during session    Home Living Family/patient expects to be discharged to:: Private residence Living Arrangements: Children Available Help at Discharge: Family Type of Home: House Home Access: Stairs to enter Entrance Stairs-Rails: Can reach both Entrance Stairs-Number of Steps: 3 Home Layout: One level Home Equipment: None      Prior Function Level of Independence: Independent               Hand Dominance   Dominant Hand: Right    Extremity/Trunk Assessment   Upper Extremity Assessment Upper Extremity Assessment: Overall WFL for tasks assessed    Lower Extremity Assessment Lower Extremity Assessment: LLE deficits/detail LLE: Unable to fully assess due to pain       Communication   Communication: No difficulties  Cognition Arousal/Alertness: Awake/alert Behavior During Therapy: WFL for tasks assessed/performed Overall Cognitive Status: Within Functional Limits for tasks assessed                                        General Comments      Exercises     Assessment/Plan    PT Assessment Patient needs continued PT services  PT Problem List Decreased activity tolerance;Decreased balance;Decreased mobility;Decreased knowledge of precautions;Pain       PT Treatment Interventions      PT Goals (Current goals can be found in  the Care Plan section)  Acute Rehab PT Goals Patient Stated Goal: to go home PT Goal Formulation: With patient Time For Goal Achievement: 07/23/18 Potential to Achieve Goals: Good    Frequency     Barriers to discharge        Co-evaluation               AM-PAC PT "6 Clicks" Mobility  Outcome Measure Help needed turning from your back to your side while in a flat bed without using bedrails?: None Help needed moving from lying on your back to sitting on the side of a flat bed without using bedrails?: None Help needed moving to and from a bed to a chair  (including a wheelchair)?: None Help needed standing up from a chair using your arms (e.g., wheelchair or bedside chair)?: A Little Help needed to walk in hospital room?: A Little Help needed climbing 3-5 steps with a railing? : A Little 6 Click Score: 21    End of Session Equipment Utilized During Treatment: Gait belt Activity Tolerance: Patient tolerated treatment well Patient left: in bed;with call bell/phone within reach Nurse Communication: Mobility status PT Visit Diagnosis: Difficulty in walking, not elsewhere classified (R26.2)    Time: 3005-1102 PT Time Calculation (min) (ACUTE ONLY): 18 min   Charges:   PT Evaluation $PT Eval Moderate Complexity: 1 Mod          Alben Deeds, PT DPT  Board Certified Neurologic Specialist Acute Rehabilitation Services Pager [phone] Office [phone]   Duncan Dull 07/09/2018, 9:17 AM

## 2018-07-09 NOTE — Progress Notes (Signed)
   Subjective:  Patient reports pain as mild.  No events.  Objective:   VITALS:   Vitals:   07/08/18 0932 07/08/18 1142 07/08/18 1237 07/08/18 2151  BP: (!) 168/99 (!) 146/74 (!) 157/84 (!) 162/85  Pulse: 77 86 80 87  Resp:  14 (!) 23   Temp: 98 F (36.7 C) 98.2 F (36.8 C) 98.2 F (36.8 C) 98.4 F (36.9 C)  TempSrc: Oral Oral Oral Oral  SpO2: 97% 98% 97% 97%  Weight:      Height:        Dressing c/d/i Remaining toes wwp   Lab Results  Component Value Date   WBC 16.6 (H) 07/09/2018   HGB 8.2 (L) 07/09/2018   HCT 24.9 (L) 07/09/2018   MCV 91.9 07/09/2018   PLT 388 07/09/2018     Assessment/Plan:  1 Day Post-Op   - Expected postop acute blood loss anemia - will monitor for symptoms - Up with PT/OT - DVT ppx - SCDs, ambulation, subq heparin - NWB operative extremity - Pain controlled - patient to receive 72 hrs of abx postop then may d/c - follow up with Dr. Sharol Given in 1 week for wound check  Eduard Roux 07/09/2018, 8:19 AM [phone]

## 2018-07-10 LAB — GLUCOSE, CAPILLARY
Glucose-Capillary: 124 mg/dL — ABNORMAL HIGH (ref 70–99)
Glucose-Capillary: 83 mg/dL (ref 70–99)
Glucose-Capillary: 97 mg/dL (ref 70–99)
Glucose-Capillary: 111 mg/dL — ABNORMAL HIGH (ref 70–99)

## 2018-07-10 LAB — BASIC METABOLIC PANEL
Anion gap: 13 (ref 5–15)
BUN: 49 mg/dL — ABNORMAL HIGH (ref 6–20)
CO2: 21 mmol/L — ABNORMAL LOW (ref 22–32)
Calcium: 8.5 mg/dL — ABNORMAL LOW (ref 8.9–10.3)
Chloride: 102 mmol/L (ref 98–111)
Creatinine, Ser: 5.7 mg/dL — ABNORMAL HIGH (ref 0.44–1.00)
GFR calc Af Amer: 10 mL/min — ABNORMAL LOW (ref >60)
GFR calc non Af Amer: 9 mL/min — ABNORMAL LOW (ref >60)
Glucose, Bld: 115 mg/dL — ABNORMAL HIGH (ref 70–99)
Potassium: 5 mmol/L (ref 3.5–5.1)
Sodium: 136 mmol/L (ref 135–145)

## 2018-07-10 LAB — MAGNESIUM: Magnesium: 2.1 mg/dL (ref 1.7–2.4)

## 2018-07-10 LAB — CBC
HCT: 28.7 % — ABNORMAL LOW (ref 36.0–46.0)
Hemoglobin: 8.8 g/dL — ABNORMAL LOW (ref 12.0–15.0)
MCH: 29.4 pg (ref 26.0–34.0)
MCHC: 30.7 g/dL (ref 30.0–36.0)
MCV: 96 fL (ref 80.0–100.0)
Platelets: 388 10*3/uL (ref 150–400)
RBC: 2.99 MIL/uL — ABNORMAL LOW (ref 3.87–5.11)
RDW: 14.1 % (ref 11.5–15.5)
WBC: 9 10*3/uL (ref 4.0–10.5)
nRBC: 0 % (ref 0.0–0.2)

## 2018-07-10 MED ORDER — FLUCONAZOLE 150 MG PO TABS
150.0000 mg | ORAL_TABLET | Freq: Once | ORAL | Status: AC
  Administered 2018-07-10: 150 mg via ORAL
  Filled 2018-07-10: qty 1

## 2018-07-10 NOTE — Progress Notes (Signed)
Pharmacy note: Unasyn and doxycycline  42 yo female with osteo of the left forth toe s/p amputation. Continuing doxycycline and Unasyn for 72 hours post-op. -hg= 8.1, plt= 371  Plan - doxyxycline and Unasyn to end after last dose today -No further adjustments needed   Hildred Laser, PharmD Clinical Pharmacist **Pharmacist phone directory can now be found on [email or web address] ([email or web address]).  Listed under [place].

## 2018-07-10 NOTE — Progress Notes (Addendum)
   Subjective:  Kathy Frank mentioned that she feels that her foot pain is worse today. She rates it as 8/10 intensity, but she feels that it may subside with the pain medication she just received. The patient also mentioned also having vaginal itching.   Objective:  Vital signs in last 24 hours: Vitals:   07/09/18 1356 07/09/18 1932 07/09/18 2133 07/10/18 0429  BP: 134/75 (!) 145/87 (!) 151/91 (!) 161/89  Pulse:  72  73  Resp: 13 11  18   Temp: 98.2 F (36.8 C) 97.9 F (36.6 C)  97.9 F (36.6 C)  TempSrc: Oral Oral  Oral  SpO2: 98% 98%  98%  Weight:      Height:       Physical Exam  Constitutional: She appears well-developed and well-nourished. No distress.  HENT:  Head: Normocephalic and atraumatic.  Eyes: Conjunctivae are normal.  Cardiovascular: Normal rate, regular rhythm and normal heart sounds.  Respiratory: Effort normal. No respiratory distress. She has no wheezes.  GI: Soft. Bowel sounds are normal. She exhibits no distension. There is no tenderness.  Genitourinary: There is no rash on the right labia. There is no rash or tenderness on the left labia. No tenderness in the vagina. Vaginal discharge (white discharge at external os) found.  Musculoskeletal: She exhibits no edema.  Skin: She is not diaphoretic. No erythema.  Psychiatric: She has a normal mood and affect. Her behavior is normal. Judgment and thought content normal.   Assessment/Plan:  Kathy Frank is a 42 y.o female with HIV, diabetes mellitus, htn, ckd who presented with left foot swelling. Found to have sepsis secondary to left foot cellulitis and left 4th toe osteomyelitis. Left superficial femoral artery angioplasty with subsequent amputation 11/29. Also noted to have hyperphosphatemia, NAGMA, and hypocalcemia secondary to progression of CKD.   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 Patient is recovering well post  amputation (11/28), without significant pain.  -PT/OT recommended rolling walker with 5" wheels on discharge -Wound culture 11/29 growing staph aureus   -Unasyn and doxycycline continued 72 hrs post surgery  CKD Stage V Patient presented with cr of 5-6 on admission, baseline cr 2-3 over the past year. Is a patient of Dr. Posey Pronto (Nephrology). Vein mapping done and plan for AV graft.  -Nephrology to follow outpatient   Hypertension  The patient's blood pressure has ranged 140-160/70-90s over the past 24 hrs.   -Continue amlodipine 47mb qd -Continue labetalol 200mg  bid   Vaginal itching Probable vaginal candidiasis secondary to recent antibiotic use. Patient may benefit from stopping antibiotics as she is already source controlled.  -Diflucan 150mg  once  Dispo: Anticipated discharge 07/11/18.   Kathy Mage, MD 07/10/2018, 6:40 AM Pager: [phone]

## 2018-07-10 NOTE — Progress Notes (Signed)
Physical Therapy Treatment Patient Details Name: Kathy Frank MRN: [id number] DOB: October 13, 1975 Today's Date: 07/10/2018    History of Present Illness Patient is a 42 yo female admitted with Sepsis 2/2 Osteomyelitis left 4th metatarsal with forefoot cellulitis: underwent amputation of L fourth metatarsal 11/29 after balloon angioplasty of LLE of superficial femoral     PT Comments    Patient seen for activity progression and stair training. Patient is mobilizing well with RW and shows good compliance with NWBing, and was able to perform stairs with assist.  Current POC remains appropriate  Follow Up Recommendations  No PT follow up;Supervision - Intermittent     Equipment Recommendations  Rolling walker with 5" wheels    Recommendations for Other Services       Precautions / Restrictions Precautions Precautions: Fall Restrictions Weight Bearing Restrictions: Yes LLE Weight Bearing: Non weight bearing    Mobility  Bed Mobility Overal bed mobility: Independent             General bed mobility comments: no physical assist required  Transfers Overall transfer level: Independent Equipment used: None                Ambulation/Gait Ambulation/Gait assistance: Supervision   Assistive device: Rolling walker (2 wheeled) Gait Pattern/deviations: Step-to pattern Gait velocity: decreased Gait velocity interpretation: <1.8 ft/sec, indicate of risk for recurrent falls General Gait Details: Hope to tecnique for NWBing compliance   Stairs Stairs: Yes Stairs assistance: Min assist Stair Management: One rail Left Number of Stairs: 4 General stair comments: hopping technique with assist and rail   Wheelchair Mobility    Modified Rankin (Stroke Patients Only)       Balance Overall balance assessment: Mild deficits observed, not formally tested                                          Cognition Arousal/Alertness: Awake/alert Behavior  During Therapy: WFL for tasks assessed/performed Overall Cognitive Status: Within Functional Limits for tasks assessed                                        Exercises      General Comments        Pertinent Vitals/Pain Pain Assessment: 0-10 Pain Score: 7  Pain Location: left foot Pain Descriptors / Indicators: Sore Pain Intervention(s): Monitored during session    Home Living                      Prior Function            PT Goals (current goals can now be found in the care plan section) Acute Rehab PT Goals Patient Stated Goal: to go home PT Goal Formulation: With patient Time For Goal Achievement: 07/23/18 Potential to Achieve Goals: Good Progress towards PT goals: Progressing toward goals    Frequency           PT Plan Current plan remains appropriate    Co-evaluation              AM-PAC PT "6 Clicks" Mobility   Outcome Measure  Help needed turning from your back to your side while in a flat bed without using bedrails?: None Help needed moving from lying on your back to sitting on the side of  a flat bed without using bedrails?: None Help needed moving to and from a bed to a chair (including a wheelchair)?: None Help needed standing up from a chair using your arms (e.g., wheelchair or bedside chair)?: A Little Help needed to walk in hospital room?: A Little Help needed climbing 3-5 steps with a railing? : A Little 6 Click Score: 21    End of Session Equipment Utilized During Treatment: Gait belt Activity Tolerance: Patient tolerated treatment well Patient left: in bed;with call bell/phone within reach Nurse Communication: Mobility status PT Visit Diagnosis: Difficulty in walking, not elsewhere classified (R26.2)     Time: 3016-0109 PT Time Calculation (min) (ACUTE ONLY): 14 min  Charges:  $Gait Training: 8-22 mins                     Alben Deeds, PT DPT  Board Certified Neurologic Specialist [place] Pager [phone] Office [place] 07/10/2018, 1:40 PM

## 2018-07-11 ENCOUNTER — Telehealth: Payer: Self-pay

## 2018-07-11 LAB — BASIC METABOLIC PANEL
Anion gap: 10 (ref 5–15)
BUN: 47 mg/dL — ABNORMAL HIGH (ref 6–20)
CO2: 24 mmol/L (ref 22–32)
Calcium: 8.1 mg/dL — ABNORMAL LOW (ref 8.9–10.3)
Chloride: 103 mmol/L (ref 98–111)
Creatinine, Ser: 5.94 mg/dL — ABNORMAL HIGH (ref 0.44–1.00)
GFR calc Af Amer: 9 mL/min — ABNORMAL LOW (ref >60)
GFR calc non Af Amer: 8 mL/min — ABNORMAL LOW (ref >60)
Glucose, Bld: 93 mg/dL (ref 70–99)
Potassium: 5.2 mmol/L — ABNORMAL HIGH (ref 3.5–5.1)
Sodium: 137 mmol/L (ref 135–145)

## 2018-07-11 LAB — GLUCOSE, CAPILLARY
Glucose-Capillary: 61 mg/dL — ABNORMAL LOW (ref 70–99)
Glucose-Capillary: 68 mg/dL — ABNORMAL LOW (ref 70–99)
Glucose-Capillary: 59 mg/dL — ABNORMAL LOW (ref 70–99)
Glucose-Capillary: 63 mg/dL — ABNORMAL LOW (ref 70–99)
Glucose-Capillary: 74 mg/dL (ref 70–99)
Glucose-Capillary: 102 mg/dL — ABNORMAL HIGH (ref 70–99)

## 2018-07-11 MED ORDER — OXYCODONE HCL 5 MG PO TABS: 5.0000 mg | ORAL_TABLET | ORAL | 0 refills | Status: AC | PRN

## 2018-07-11 MED ORDER — ASPIRIN 81 MG PO TBEC: 81.0000 mg | DELAYED_RELEASE_TABLET | Freq: Every day | ORAL | 3 refills | Status: DC

## 2018-07-11 MED FILL — oxyCODONE HCL 5 MG TABS: 5 | 4 days supply | Qty: 24 | Fill #0

## 2018-07-11 MED FILL — ASPIRIN LOW DOSE 81 MG TBEC: 81 | 90 days supply | Qty: 90 | Fill #0

## 2018-07-11 NOTE — Progress Notes (Signed)
Patient ID: Kathy Frank, female   DOB: 10-06-1975, 42 y.o.   MRN: [id number] Patient is status post revascularization to the left lower extremity status post left fourth ray amputation.  Discussed with the patient she had minimal petechial bleeding.  She has ischemic changes to the lesser toes.  Discussed that she does have significant improved macro circulation but we will need to give this time to see how her microcirculation responds.  Discussed the importance of nonweightbearing on the left foot.  I will follow-up in the office in 1 week.

## 2018-07-11 NOTE — Progress Notes (Signed)
Hypoglycemic Event  CBG: 61  Treatment: 15 GM carbohydrate snack  Symptoms: Shaky  Follow-up CBG: Time:0700 CBG Result:68  Possible Reasons for Event: Unknown  Comments/MD notified: MD notified. Pt now eating will recheck in 15 minutes     Tanya Crothers Lanora Manis

## 2018-07-11 NOTE — Progress Notes (Signed)
   Subjective:  Kathy Frank is a 42 y.o. with PMH of HIV on treatment, T2DM, HTN, CKD admit for left toe osteomyelitis on hospital day 8  Kathy Frank was examined and evaluated at bedside this AM. She states she had no acute events overnight. She mentions that she was told to follow up with ortho for dressing change and she does not need any additional wound dressing management for discharge. She also mentions that her vaginal itching has significantly improved. Denies any F/N/V/D/C.  Objective:  Vital signs in last 24 hours: Vitals:   07/10/18 1916 07/11/18 0544 07/11/18 0829 07/11/18 0830  BP: (!) 145/82 132/80 (!) 141/84 (!) 121/100  Pulse: 75 76  72  Resp: 13 14  11   Temp: 97.9 F (36.6 C) 98 F (36.7 C)  (!) 97.5 F (36.4 C)  TempSrc: Oral Oral  Axillary  SpO2: 96% 98%  100%  Weight:      Height:       Physical Exam  Constitutional: She is oriented to person, place, and time and well-developed, well-nourished, and in no distress. No distress.  HENT:  Head: Normocephalic and atraumatic.  Eyes: Pupils are equal, round, and reactive to light. Conjunctivae and EOM are normal.  Neck: Normal range of motion. Neck supple.  Cardiovascular: Normal rate, regular rhythm, normal heart sounds and intact distal pulses.  Pulmonary/Chest: Effort normal and breath sounds normal.  Abdominal: Soft. Bowel sounds are normal. There is no tenderness.  Musculoskeletal: Normal range of motion. She exhibits deformity (Left foot wrapped in bandaging. Able to wiggle. No obvious warmth or edema surrounding the foot.). She exhibits no edema.  Neurological: She is alert and oriented to person, place, and time. GCS score is 15.  Skin: Skin is warm and dry. She is not diaphoretic.    Assessment/Plan:  Principal Problem:   Osteomyelitis of foot, left, acute (HCC) Active Problems:   Essential hypertension   Human immunodeficiency virus (HIV) disease ([place])   AKI (acute kidney injury) ([place])   Acute on  chronic anemia   Chronic kidney disease (CKD), stage IV (severe) (HCC)  Kathy Frank is a 42 yo F w/ PMH of HIV on treatment, T2DM, HTN, CKD admit for left toe osteomyelitis. Vascular has performed angioplasty of her superficial femoral artery. Her source of infection has been controlled with amputation of her left 4th toe. She finished her 72 hr course of Unasyn per ortho and she is stable for discharge.  Left 4th metatarsal osteomyelitis s/p amputation post op day 3. Received last dose of Unasyn early this morning. Clean margins on surgical - Discharge home today - F/u with ortho outpatient   CKD stage V Creatinine 5-6 from baseline of 2-3 - F/u w/ Nephrology as outpatient  HTN - F/u with PCP as outpatient - C/w home meds on discharge  DVT prophx: subqheparin Diet: Diabetic Bowel: Miralax Code: Full  Dispo: Anticipated discharge in approximately today(s).   Mosetta Anis, MD 07/11/2018, 11:08 AM Pager: [phone]

## 2018-07-11 NOTE — Progress Notes (Signed)
Potasium level 5.2, Lee MD notified, no new order received will monitor.

## 2018-07-11 NOTE — Progress Notes (Signed)
Patient in a stable condition, discharge education reviewed with patient by Rolanda Lundborg SWOT nurse, patient belongings at bedside, rolling walker given to patient , home meds delievered by pharmacy, tele dc ccmd notified, patient's daughter to transport patient home.

## 2018-07-11 NOTE — Care Management Note (Addendum)
Case Management Note Marvetta Gibbons RN, BSN Transitions of Care Unit 4E- RN Case Manager [phone]  Patient Details  Name: Kathy Frank MRN: [id number] Date of Birth: August 26, 1975  Subjective/Objective:   Pt admitted with osteo,   S/p revascularization to left LE and s/p left fourth toe amputation               Action/Plan: PTA pt lived at home, plan to return home, no recommendations for f/u made by PT. Pt has Medicaid and PCP- Vahini Chundi, No CM needs noted for transition home.   Expected Discharge Date:  07/11/18               Expected Discharge Plan:  Home/Self Care  In-House Referral:  NA  Discharge planning Services  CM Consult  Post Acute Care Choice:  Durable Medical Equipment Choice offered to:  NA  DME Arranged:   rolling walker DME Agency:   [place]:    HH Agency:     Status of Service:  Completed, signed off  If discussed at [place] of Stay Meetings, dates discussed:    Discharge Disposition: home/self care   Additional Comments:  07/11/18- 1200- Marvetta Gibbons RN, CM- update- notified by RN that pt wanted RW for home- order has been placed- call made to [facility] with [facility] for DME need- RW to be delivered to room prior to discharge- [facility] pharmacy to deliver meds to bedside.   Dawayne Patricia, RN 07/11/2018, 11:28 AM

## 2018-07-11 NOTE — Telephone Encounter (Signed)
Hospital TOC per Dr Maricela Bo, discharge 07/11/2018, appt 07/18/2018.

## 2018-07-11 NOTE — Evaluation (Signed)
Occupational Therapy Evaluation Patient Details Name: Kathy Frank MRN: [id number] DOB: [birth year] Today's Date: 07/11/2018    History of Present Illness Patient is a 42 yo female admitted with Sepsis 2/2 Osteomyelitis left 4th metatarsal with forefoot cellulitis: underwent amputation of L fourth metatarsal 11/29 after balloon angioplasty of LLE of superficial femoral    Clinical Impression   Pt admitted with above problem list. Pt now presenting with post op pain and mild balance deficits d/t LLE NWB precautions. Pt demonstrated good carryover of education initiated with PT. Further edu provided regarding skin integrity/inspections, fall prevention, safety awareness, use of DME for bathing, and safe RW use in the community. Pt at mod I with ADLs, no further OT needs indicated at this time. Ready for d/c home with family assistance.     Follow Up Recommendations  No OT follow up    Equipment Recommendations  None recommended by OT       Precautions / Restrictions Precautions Precautions: Fall Restrictions Weight Bearing Restrictions: Yes LLE Weight Bearing: Non weight bearing      Mobility Bed Mobility Overal bed mobility: Independent             General bed mobility comments: no physical assist or use of bed features   Transfers Overall transfer level: Modified independent Equipment used: Rolling walker (2 wheeled)                  Balance Overall balance assessment: Mild deficits observed, not formally tested                                         ADL either performed or assessed with clinical judgement   ADL Overall ADL's : Modified independent                                       General ADL Comments: Using RW, pt able to complete all b/d tasks at sit <> stand level     Vision Baseline Vision/History: Macular Degeneration Patient Visual Report: No change from baseline Vision Assessment?: No apparent visual  deficits            Pertinent Vitals/Pain Pain Assessment: No/denies pain     Hand Dominance Right   Extremity/Trunk Assessment Upper Extremity Assessment Upper Extremity Assessment: Overall WFL for tasks assessed   Lower Extremity Assessment Lower Extremity Assessment: Defer to PT evaluation   Cervical / Trunk Assessment Cervical / Trunk Assessment: Normal   Communication Communication Communication: No difficulties   Cognition Arousal/Alertness: Awake/alert Behavior During Therapy: WFL for tasks assessed/performed Overall Cognitive Status: Within Functional Limits for tasks assessed                                     General Comments  Pt with good adherence to NWB LLE throughout session            Home Living Family/patient expects to be discharged to:: Private residence Living Arrangements: Children Available Help at Discharge: Family Type of Home: House Home Access: Stairs to enter Technical brewer of Steps: 3 Entrance Stairs-Rails: Can reach both Home Layout: One level     Bathroom Shower/Tub: Teacher, early years/pre: Standard     Home Equipment:  None   Additional Comments: Pt's mother lives next door and has shower chair pt will use      Prior Functioning/Environment Level of Independence: Independent                 OT Problem List: Impaired balance (sitting and/or standing);Decreased knowledge of use of DME or AE;Pain;Decreased knowledge of precautions         OT Goals(Current goals can be found in the care plan section) Acute Rehab OT Goals Patient Stated Goal: to go home OT Goal Formulation: All assessment and education complete, DC therapy   AM-PAC OT "6 Clicks" Daily Activity     Outcome Measure Help from another person eating meals?: None Help from another person taking care of personal grooming?: None Help from another person toileting, which includes using toliet, bedpan, or urinal?: None Help  from another person bathing (including washing, rinsing, drying)?: None Help from another person to put on and taking off regular upper body clothing?: None Help from another person to put on and taking off regular lower body clothing?: None 6 Click Score: 24   End of Session Equipment Utilized During Treatment: Rolling walker Nurse Communication: Mobility status  Activity Tolerance: Patient tolerated treatment well Patient left: in bed;with nursing/sitter in room;with call bell/phone within reach  OT Visit Diagnosis: Unsteadiness on feet (R26.81)                Time: 0601-5615 OT Time Calculation (min): 16 min Charges:  OT General Charges $OT Visit: 1 Visit OT Evaluation $OT Eval Low Complexity: 1 Low  Curtis Sites OTR/L  07/11/2018, 8:21 AM

## 2018-07-11 NOTE — Discharge Instructions (Signed)
[name] came to [place] with signs of infection in your left foot. Our vascular surgeons put a stent in an artery in your leg to improve blood flow and our orthopedic surgeons amputated one of your toes to treat the source of infection. We have treated you with antibiotics and now we feel you are safe to be discharged. Here are our recommendations for you:  - Keep your wound dressing clean until your follow up appointment with you surgeon - Please start taking a baby aspirin 1 tablet every day - Please start taking Juluca 1 tabelet every day with a big meal - Please STOP taking Descovy  Thank you for choosing [facility]

## 2018-07-12 ENCOUNTER — Other Ambulatory Visit: Payer: Self-pay | Admitting: Internal Medicine

## 2018-07-12 ENCOUNTER — Telehealth: Payer: Self-pay | Admitting: Surgery

## 2018-07-12 DIAGNOSIS — I10 Essential (primary) hypertension: Secondary | ICD-10-CM

## 2018-07-12 NOTE — Telephone Encounter (Signed)
Spoke w/ pt, she did not get all her meds when in hosp, called [facility], pt dolutegravir-rilpivirine was sent to main pharmacy before disch and was not given when pt was actually disch. Pharmacist in transitions assisted with pt obtaining med and pt is happy

## 2018-07-12 NOTE — Telephone Encounter (Signed)
-----   Message from Mena Goes, RN sent at 07/08/2018 11:47 AM EST ----- Regarding: 4 weeks with LLE arterial duplex and NP appt   ----- Message ----- From: Gabriel Earing, PA-C Sent: 07/08/2018  10:25 AM EST To: [facility] Admin Pool, [facility] Charge Pool  F/u with suzanne in 4 weeks with arterial duplex LLE  Thanks

## 2018-07-12 NOTE — Telephone Encounter (Signed)
sch appt [facility] 08/22/2018 1pm LLE art 115pm f/u NP

## 2018-07-12 NOTE — Telephone Encounter (Signed)
Pt would like a call back about her D/C instructions and her medications on yesterday

## 2018-07-12 NOTE — Telephone Encounter (Signed)
Pt is calling back to speak with a nurse about med. Please call pt back.  

## 2018-07-13 LAB — AEROBIC/ANAEROBIC CULTURE W GRAM STAIN (SURGICAL/DEEP WOUND)

## 2018-07-13 LAB — PTH-RELATED PEPTIDE: PTH-related peptide: <2 pmol/L

## 2018-07-13 LAB — HLA B*5701: HLA B 5701: NEGATIVE

## 2018-07-14 ENCOUNTER — Telehealth (INDEPENDENT_AMBULATORY_CARE_PROVIDER_SITE_OTHER): Payer: Self-pay

## 2018-07-14 ENCOUNTER — Other Ambulatory Visit (INDEPENDENT_AMBULATORY_CARE_PROVIDER_SITE_OTHER): Payer: Self-pay

## 2018-07-14 MED ORDER — DOXYCYCLINE HYCLATE 100 MG PO TABS: 100.0000 mg | ORAL_TABLET | Freq: Two times a day (BID) | ORAL | 0 refills | Status: DC

## 2018-07-14 NOTE — Telephone Encounter (Signed)
-----   Message from [name], MD sent at 07/14/2018 12:28 PM EST ----- Call patient, cultures positive for sensitive bacteria very sensitive to doxy, call in doxy for her for 2 weeks

## 2018-07-14 NOTE — Progress Notes (Signed)
doxy 

## 2018-07-14 NOTE — Telephone Encounter (Signed)
Called pt to advise of culture result per Dr. Sharol Given and that rx has been faxed to pharm. To call with questions.

## 2018-07-18 ENCOUNTER — Encounter: Payer: Self-pay | Admitting: Internal Medicine

## 2018-07-18 ENCOUNTER — Ambulatory Visit: Payer: Medicaid Other | Admitting: Internal Medicine

## 2018-07-18 ENCOUNTER — Other Ambulatory Visit: Payer: Self-pay

## 2018-07-18 VITALS — BP 155/85 | HR 86 | Temp 98.6°F | Ht 65.0 in | Wt 154.9 lb

## 2018-07-18 DIAGNOSIS — N183 Chronic kidney disease, stage 3 (moderate): Secondary | ICD-10-CM | POA: Diagnosis not present

## 2018-07-18 DIAGNOSIS — M86172 Other acute osteomyelitis, left ankle and foot: Secondary | ICD-10-CM

## 2018-07-18 DIAGNOSIS — B9689 Other specified bacterial agents as the cause of diseases classified elsewhere: Secondary | ICD-10-CM | POA: Diagnosis not present

## 2018-07-18 DIAGNOSIS — D631 Anemia in chronic kidney disease: Secondary | ICD-10-CM | POA: Diagnosis not present

## 2018-07-18 DIAGNOSIS — Z79899 Other long term (current) drug therapy: Secondary | ICD-10-CM

## 2018-07-18 DIAGNOSIS — Z9889 Other specified postprocedural states: Secondary | ICD-10-CM

## 2018-07-18 DIAGNOSIS — Z89422 Acquired absence of other left toe(s): Secondary | ICD-10-CM | POA: Diagnosis not present

## 2018-07-18 DIAGNOSIS — Z9862 Peripheral vascular angioplasty status: Secondary | ICD-10-CM | POA: Diagnosis not present

## 2018-07-18 DIAGNOSIS — D649 Anemia, unspecified: Secondary | ICD-10-CM

## 2018-07-18 NOTE — Assessment & Plan Note (Signed)
Patient has chronic anemia 2/2 CKD. During her recent hospital admission she received 2U RBC transfusion, once at admission for Hgb 6.8, again 11/28 for 7.0 and prior to surgery. Aranesp started q4 weeks and she was discharged with ferrous sulfate 325 mg qd.   Plan: - Follow-up CBC today

## 2018-07-18 NOTE — Progress Notes (Signed)
   CC: Osteomyelitis of left 4th metatarsal s/p amputation  HPI:  [name] is a 42 y.o. with a PMHx listed below presenting today for a hospital follow up after amputation of her left 4th metatarsal 2/2 osteomyelitis.   For details of today's visit and the status of his chronic medical issues please refer to the assessment and plan.   Past Medical History:  Diagnosis Date  . CKD (chronic kidney disease) stage 3, GFR 30-59 ml/min (HCC) 06/19/2016   06/30/16: Seen by Dr. Posey Pronto [Nephrology]. Suspect etiologies to include diabetes and hypertension though cannot exclude HIV-associated nephropathy. Advised her against from using excessive Goody powder.  . CKD (chronic kidney disease), stage III ([place])   . Contact dermatitis 04/21/2016   07/29/16: Skin biopsy performed by [facility] notable for acute spongiotic dermatitis consistent with contact dermatitis.  . Cyclical vomiting    THC induced???  . Depression   . Depression 06/28/2006   Qualifier: Diagnosis of  By: Riccardo Dubin MD, Sherren Mocha    . Diabetes mellitus type 2 in obese ([place]) 06/28/1994  . DKA (diabetic ketoacidoses) ([place]) 12/2014  . Erosive esophagitis   . Esophageal reflux   . Essential hypertension 11/16/2013  . Gastroparesis    ? diabetic  . GERD (gastroesophageal reflux disease)   . Human immunodeficiency virus (HIV) disease ([place]) dx'd 04/2016  . Human immunodeficiency virus (HIV) disease ([place]) 04/23/2016  . Hypertension   . Moderate nonproliferative diabetic retinopathy of both eyes ([place]) 11/21/2014   11/14/14: Noted on retinal imaging; needs follow-up imaging in 6 months  05/22/16: Noted on retinal imaging again; needs follow-up imaging in 6 months  . Normocytic anemia   . TOBACCO USER 06/28/2006   Qualifier: Diagnosis of  By: Riccardo Dubin MD, Sherren Mocha    . Type II diabetes mellitus ([place]) 1994   diagnosed around 1994   Review of Systems:   Review of Systems  Constitutional: Positive for malaise/fatigue. Negative for  chills and fever.  Cardiovascular: Negative for leg swelling.  Gastrointestinal: Negative for abdominal pain, nausea and vomiting.     Physical Exam:  Vitals:   07/18/18 1016  BP: (!) 155/85  Pulse: 86  Temp: 98.6 F (37 C)  TempSrc: Oral  SpO2: 100%  Weight: 154 lb 14.4 oz (70.3 kg)  Height: 5\' 5"  (1.651 m)   Physical Exam  Constitutional: She is oriented to person, place, and time and well-developed, well-nourished, and in no distress.  Cardiovascular: Normal rate, regular rhythm and normal heart sounds.  No murmur heard. Pulmonary/Chest: Effort normal and breath sounds normal. No respiratory distress. She has no wheezes.  Abdominal: Soft. Bowel sounds are normal. She exhibits no distension. There is no tenderness.  Musculoskeletal: She exhibits no edema.  No drainage or pus seen near site of left 4th metatarsal amputation  Neurological: She is alert and oriented to person, place, and time.  Skin: Skin is warm and dry.  Psychiatric: Mood, memory, affect and judgment normal.    Assessment & Plan:   See Encounters Tab for problem based charting.  Patient seen with Dr. Lynnae January

## 2018-07-18 NOTE — Patient Instructions (Signed)
Kathy Frank,  I am glad to see you are feeling better today! Make sure to schedule a follow up appointment with Dr. Sharol Given as soon as possible. We will want you to follow up with [place] after the holidays in January regarding your diabetes and high blood pressure.  Happy Holidays!

## 2018-07-18 NOTE — Assessment & Plan Note (Addendum)
Patient was recently hospitalized from November 23 to December 2 for sepsis secondary to osteomyelitis of her left fourth metatarsal.  She was treated with IV Unasyn and doxycycline for seven days, underwent amputation of L fourth metatarsal 11/29 after balloon angioplasty of LLE of superficial femoral artery.  She presents to the clinic today for hospital follow-up.  She reported that she has been doing well since she was discharged from the hospital.  She is still having some pain but able to ambulate with a walker.  She denies any fever, chills, nausea, vomiting or pus or discharge from her surgery site.  She reported Dr. Sharol Given, her orthopedic surgeon, called her to let her know that her culture showed bacteria and he wanted her to continue on doxycycline for another week.  She reported she needs to schedule a follow-up appointment with him.  Lan: -Patient to follow-up with Dr. Sharol Given

## 2018-07-19 LAB — CBC
Hematocrit: 29.2 % — ABNORMAL LOW (ref 34.0–46.6)
Hemoglobin: 9.8 g/dL — ABNORMAL LOW (ref 11.1–15.9)
MCH: 29.9 pg (ref 26.6–33.0)
MCHC: 33.6 g/dL (ref 31.5–35.7)
MCV: 89 fL (ref 79–97)
Platelets: 417 10*3/uL (ref 150–450)
RBC: 3.28 x10E6/uL — ABNORMAL LOW (ref 3.77–5.28)
RDW: 12.9 % (ref 12.3–15.4)
WBC: 10.2 10*3/uL (ref 3.4–10.8)

## 2018-07-19 NOTE — Telephone Encounter (Signed)
Appt completed 07/18/2018.

## 2018-07-19 NOTE — Progress Notes (Signed)
Internal Medicine Clinic Attending  I saw and evaluated the patient.  I personally confirmed the key portions of the history and exam documented by Dr.  Rehman  and I reviewed pertinent patient test results.  The assessment, diagnosis, and plan were formulated together and I agree with the documentation in the resident's note.  

## 2018-07-20 ENCOUNTER — Ambulatory Visit (INDEPENDENT_AMBULATORY_CARE_PROVIDER_SITE_OTHER): Payer: Medicaid Other | Admitting: Physician Assistant

## 2018-07-20 ENCOUNTER — Encounter (INDEPENDENT_AMBULATORY_CARE_PROVIDER_SITE_OTHER): Payer: Self-pay | Admitting: Physician Assistant

## 2018-07-20 VITALS — Ht 65.0 in | Wt 154.9 lb

## 2018-07-20 DIAGNOSIS — N183 Chronic kidney disease, stage 3 unspecified: Secondary | ICD-10-CM

## 2018-07-20 DIAGNOSIS — E440 Moderate protein-calorie malnutrition: Secondary | ICD-10-CM

## 2018-07-20 DIAGNOSIS — Z89422 Acquired absence of other left toe(s): Secondary | ICD-10-CM

## 2018-07-20 DIAGNOSIS — E1142 Type 2 diabetes mellitus with diabetic polyneuropathy: Secondary | ICD-10-CM

## 2018-07-20 DIAGNOSIS — Z794 Long term (current) use of insulin: Secondary | ICD-10-CM

## 2018-07-20 NOTE — Progress Notes (Signed)
Office Visit Note   Patient: Kathy Frank           Date of Birth: June 25, 1976           MRN: [id number] Visit Date: 07/20/2018              Requested by: Lars Mage, MD [address] [place], [place] [address] PCP: Lars Mage, MD  Chief Complaint  Patient presents with  . Left Foot - Routine Post Op      HPI: The patient is a 42 year old woman who is seen for postoperative follow-up following left foot fourth ray amputation on 07/08/2018.  She reports she has been wearing her Darco shoe and trying to stay off her foot as much as possible.  She does report moderate to severe pain at times but has only been taking Tylenol.  She reports she is unable to take ibuprofen as it hurts her stomach.  She completed a course of doxycycline.  Assessment & Plan: Visit Diagnoses:  1. 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 Type 2 diabetes mellitus with diabetic polyneuropathy, with long-term current use of insulin ([place])   3. CKD (chronic kidney disease) stage 3, GFR 30-59 ml/min (HCC)     Plan: Dry gauze dressing and Ace wrap to the left foot operative site daily and continue Darco shoe.  Patient counseled to offload the left foot is much as possible and to elevate as much as possible.  She will follow-up in 1 week  Follow-Up Instructions: Return in about 1 week (around 07/27/2018).   Ortho Exam  Patient is alert, oriented, no adenopathy, well-dressed, normal affect, normal respiratory effort. The operative dressings were removed on the left foot.  The incision is loosely closed with some bloody drainage and mild tension over the sutures.  She does have some maceration of the area with skin tear in the incisional line but the tissue does appear viable still.  She has a palpable pedal pulse.  There are no signs of cellulitis currently.  Imaging: No results found. No images are attached to the encounter.  Labs: Lab Results  Component Value Date   HGBA1C 7.9 (A) 02/04/2018   HGBA1C 9.0 (H) 09/13/2017   HGBA1C 7.5 09/29/2016   ESRSEDRATE 85 (H) 02/02/2017   CRP 42.4 (H) 02/02/2017   REPTSTATUS 07/13/2018 FINAL 07/08/2018   GRAMSTAIN  07/08/2018    RARE WBC PRESENT, PREDOMINANTLY PMN RARE GRAM POSITIVE COCCI IN PAIRS    CULT  07/08/2018    FEW STAPHYLOCOCCUS AUREUS FEW GROUP B STREP(S.AGALACTIAE)ISOLATED TESTING AGAINST S. AGALACTIAE NOT ROUTINELY PERFORMED DUE TO PREDICTABILITY OF AMP/PEN/VAN SUSCEPTIBILITY. NO ANAEROBES ISOLATED Performed at [facility], [place] [address], [place], [place] [address]    [place] 07/08/2018     Lab Results  Component Value Date   ALBUMIN 2.2 (L) 07/09/2018   ALBUMIN 2.1 (L) 07/08/2018   ALBUMIN 2.1 (L) 07/07/2018    Body mass index is 25.78 kg/m.  Orders:  No orders of the defined types were placed in this encounter.  No orders of the defined types were placed in this encounter.    Procedures: No procedures performed  Clinical Data: No additional findings.  ROS:  All other systems negative, except as noted in the HPI. Review of Systems  Objective: Vital Signs: Ht 5\' 5"  (1.651 m)   Wt 154 lb 14.4 oz (70.3 kg)   LMP 06/27/2018   BMI 25.78 kg/m  Specialty Comments:  No specialty comments available.  PMFS History: Patient Active Problem List   Diagnosis Date Noted  . Chronic kidney disease (CKD), stage IV (severe) ([place])   . AKI (acute kidney injury) ([place]) 07/03/2018  . Osteomyelitis of foot, left, acute ([place]) 07/03/2018  . Acute on chronic anemia 07/03/2018  . Cellulitis of leg, left   . Fecal incontinence 02/04/2018  . Healthcare maintenance 09/17/2017  . Right leg swelling 02/26/2017  . Superficial ulcerative lesion ([place]) 02/02/2017  . Aortic atherosclerosis ([place]) 09/30/2016  . Orthostatic hypotension 09/30/2016  . Hypokalemia 09/30/2016  . Diabetic foot ulcer associated with diabetes mellitus due to underlying condition ([place])  07/08/2016  . CKD (chronic kidney disease) stage 3, GFR 30-59 ml/min (HCC) 06/19/2016  . Human immunodeficiency virus (HIV) disease ([place]) 04/23/2016  . Gastroparesis   . Moderate nonproliferative diabetic retinopathy of both eyes ([place]) 11/21/2014  . Esophageal reflux   . Microscopic hematuria 10/31/2014  . Essential hypertension 11/16/2013  . TOBACCO USER 06/28/2006  . Depression 06/28/2006  . Diabetes mellitus type 2 in obese [facility]) 06/28/1994   Past Medical History:  Diagnosis Date  . CKD (chronic kidney disease) stage 3, GFR 30-59 ml/min (HCC) 06/19/2016   06/30/16: Seen by Dr. Posey Pronto [Nephrology]. Suspect etiologies to include diabetes and hypertension though cannot exclude HIV-associated nephropathy. Advised her against from using excessive Goody powder.  . CKD (chronic kidney disease), stage III ([place])   . Contact dermatitis 04/21/2016   07/29/16: Skin biopsy performed by [facility] notable for acute spongiotic dermatitis consistent with contact dermatitis.  . Cyclical vomiting    THC induced???  . Depression   . Depression 06/28/2006   Qualifier: Diagnosis of  By: Riccardo Dubin MD, Sherren Mocha    . Diabetes mellitus type 2 in obese ([employer]) 06/28/1994  . DKA (diabetic ketoacidoses) ([place]) 12/2014  . Erosive esophagitis   . Esophageal reflux   . Essential hypertension 11/16/2013  . Gastroparesis    ? diabetic  . GERD (gastroesophageal reflux disease)   . Human immunodeficiency virus (HIV) disease ([place]) dx'd 04/2016  . Human immunodeficiency virus (HIV) disease ([place]) 04/23/2016  . Hypertension   . Moderate nonproliferative diabetic retinopathy of both eyes ([place]) 11/21/2014   11/14/14: Noted on retinal imaging; needs follow-up imaging in 6 months  05/22/16: Noted on retinal imaging again; needs follow-up imaging in 6 months  . Normocytic anemia   . TOBACCO USER 06/28/2006   Qualifier: Diagnosis of  By: Riccardo Dubin MD, Sherren Mocha    . Type II diabetes mellitus ([place]) 1994   diagnosed around 20     Family History  Problem Relation Age of Onset  . Diabetes Mother   . Diabetes Brother   . Diabetes Daughter   . Diabetes Daughter   . Mental retardation Brother        died from PNA  . Diabetes Maternal Grandmother     Past Surgical History:  Procedure Laterality Date  . AMPUTATION Left 07/08/2018   Procedure: AMPUTATION FORTH RAY LEFT FOOT;  Surgeon: [name], MD;  Location: [facility];  Service: Orthopedics;  Laterality: Left;  . LOWER EXTREMITY ANGIOGRAPHY N/A 07/05/2018   Procedure: LOWER EXTREMITY ANGIOGRAPHY;  Surgeon: Serafina Mitchell, MD;  Location: [facility];  Service: Cardiovascular;  Laterality: N/A;  . PERIPHERAL VASCULAR BALLOON ANGIOPLASTY Left 07/05/2018   Procedure: PERIPHERAL VASCULAR BALLOON ANGIOPLASTY;  Surgeon: Serafina Mitchell, MD;  Location: [facility];  Service: Cardiovascular;  Laterality: Left;  SFA  .  TUBAL LIGATION  2002   Social History   Occupational History    Employer: UNEMPLOYED  Tobacco Use  . Smoking status: Former Smoker    Packs/day: 0.10    Years: 10.00    Pack years: 1.00    Types: Cigarettes    Start date: 11/17/2013    Last attempt to quit: 09/09/2014    Years since quitting: 3.8  . Smokeless tobacco: Never Used  Substance and Sexual Activity  . Alcohol use: Yes    Comment: Occasionally.  . Drug use: Yes    Frequency: 4.0 times per week    Types: Marijuana  . Sexual activity: Not on file

## 2018-07-27 ENCOUNTER — Ambulatory Visit (INDEPENDENT_AMBULATORY_CARE_PROVIDER_SITE_OTHER): Payer: Medicaid Other | Admitting: Physician Assistant

## 2018-07-27 ENCOUNTER — Ambulatory Visit (INDEPENDENT_AMBULATORY_CARE_PROVIDER_SITE_OTHER): Payer: Medicaid Other | Admitting: Family

## 2018-08-01 ENCOUNTER — Encounter (INDEPENDENT_AMBULATORY_CARE_PROVIDER_SITE_OTHER): Payer: Self-pay | Admitting: Physician Assistant

## 2018-08-01 ENCOUNTER — Ambulatory Visit (INDEPENDENT_AMBULATORY_CARE_PROVIDER_SITE_OTHER): Payer: Medicaid Other | Admitting: Physician Assistant

## 2018-08-01 VITALS — Ht 65.0 in | Wt 154.0 lb

## 2018-08-01 DIAGNOSIS — Z89422 Acquired absence of other left toe(s): Secondary | ICD-10-CM

## 2018-08-01 DIAGNOSIS — N183 Chronic kidney disease, stage 3 unspecified: Secondary | ICD-10-CM

## 2018-08-01 DIAGNOSIS — E440 Moderate protein-calorie malnutrition: Secondary | ICD-10-CM

## 2018-08-01 DIAGNOSIS — E1142 Type 2 diabetes mellitus with diabetic polyneuropathy: Secondary | ICD-10-CM

## 2018-08-01 DIAGNOSIS — Z794 Long term (current) use of insulin: Secondary | ICD-10-CM

## 2018-08-01 MED ORDER — SILVER SULFADIAZINE 1 % EX CREA: 1.0000 "application " | TOPICAL_CREAM | Freq: Every day | CUTANEOUS | 0 refills | Status: DC

## 2018-08-01 MED ORDER — OXYCODONE-ACETAMINOPHEN 5-325 MG PO TABS: 1.0000 | ORAL_TABLET | Freq: Four times a day (QID) | ORAL | 0 refills | Status: DC | PRN

## 2018-08-01 MED ORDER — DOXYCYCLINE HYCLATE 100 MG PO TABS: 100.0000 mg | ORAL_TABLET | Freq: Two times a day (BID) | ORAL | 0 refills | Status: DC

## 2018-08-01 NOTE — Progress Notes (Signed)
Office Visit Note   Patient: Kathy Frank           Date of Birth: 08/08/1976           MRN: [id number] Visit Date: 08/01/2018              Requested by: Lars Mage, MD [address] [place], [place] [address] PCP: Lars Mage, MD  Chief Complaint  Patient presents with  . Left Foot - Routine Post Op    07/08/18 left foot 4th ray amputation       HPI: The patient is a 42 year old woman with insensate diabetic feet who underwent a left foot fourth ray amputation on 07/08/2018.  She has been apparently weightbearing in a Darco shoe.  She reports she noticed that her incision was coming open.  She is not currently on any antibiotics.  She reports moderate pain in the area.  She reports that she has had limited ability to clean the foot and has been dealing with lack of heat where she is residing.  Assessment & Plan: Visit Diagnoses:  1. 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 Type 2 diabetes mellitus with diabetic polyneuropathy, with long-term current use of insulin ([place])   3. CKD (chronic kidney disease) stage 3, GFR 30-59 ml/min (HCC)   4. Moderate protein malnutrition ([place])     Plan: We will start doxycycline 100 mg p.o. twice daily for the next 14 days.  Also start Silvadene cream to the foot daily.  Patient was instructed to then dressed the foot with gauze and Ace wrapping and to avoid putting any weight on the foot as much as possible and to elevate the foot is much as possible over the next week.  We did discuss that the wound may require revision.  She will follow-up in 1 week or sooner should she have difficulties in the interim.  Follow-Up Instructions: Return in about 1 week (around 08/08/2018).   Ortho Exam  Patient is alert, oriented, no adenopathy, well-dressed, normal affect, normal respiratory effort. The left fourth ray amputation incision is widened with sutures intact but under significant tension.  There is moderate  slightly purulent drainage but no erythema of the foot.  There is some mild edema of the foot.  Imaging: No results found. No images are attached to the encounter.  Labs: Lab Results  Component Value Date   HGBA1C 7.9 (A) 02/04/2018   HGBA1C 9.0 (H) 09/13/2017   HGBA1C 7.5 09/29/2016   ESRSEDRATE 85 (H) 02/02/2017   CRP 42.4 (H) 02/02/2017   REPTSTATUS 07/13/2018 FINAL 07/08/2018   GRAMSTAIN  07/08/2018    RARE WBC PRESENT, PREDOMINANTLY PMN RARE GRAM POSITIVE COCCI IN PAIRS    CULT  07/08/2018    FEW STAPHYLOCOCCUS AUREUS FEW GROUP B STREP(S.AGALACTIAE)ISOLATED TESTING AGAINST S. AGALACTIAE NOT ROUTINELY PERFORMED DUE TO PREDICTABILITY OF AMP/PEN/VAN SUSCEPTIBILITY. NO ANAEROBES ISOLATED Performed at [facility], [place] [address] [place], [place] [address]    [place] 07/08/2018     Lab Results  Component Value Date   ALBUMIN 2.2 (L) 07/09/2018   ALBUMIN 2.1 (L) 07/08/2018   ALBUMIN 2.1 (L) 07/07/2018    Body mass index is 25.63 kg/m.  Orders:  No orders of the defined types were placed in this encounter.  Meds ordered this encounter  Medications  . doxycycline (VIBRA-TABS) 100 MG tablet    Sig: Take 1 tablet (100 mg total) by mouth  2 (two) times daily.    Dispense:  14 tablet    Refill:  0  . silver sulfADIAZINE (SILVADENE) 1 % cream    Sig: Apply 1 application topically daily.    Dispense:  50 g    Refill:  0  . oxyCODONE-acetaminophen (PERCOCET/ROXICET) 5-325 MG tablet    Sig: Take 1 tablet by mouth every 6 (six) hours as needed for moderate pain or severe pain.    Dispense:  28 tablet    Refill:  0     Procedures: No procedures performed  Clinical Data: No additional findings.  ROS:  All other systems negative, except as noted in the HPI. Review of Systems  Objective: Vital Signs: Ht 5\' 5"  (1.651 m)   Wt 154 lb (69.9 kg)   BMI 25.63 kg/m   Specialty Comments:  No specialty comments available.  PMFS  History: Patient Active Problem List   Diagnosis Date Noted  . Chronic kidney disease (CKD), stage IV (severe) ([place])   . AKI (acute kidney injury) ([place]) 07/03/2018  . Osteomyelitis of foot, left, acute ([place]) 07/03/2018  . Acute on chronic anemia 07/03/2018  . Cellulitis of leg, left   . Fecal incontinence 02/04/2018  . Healthcare maintenance 09/17/2017  . Right leg swelling 02/26/2017  . Superficial ulcerative lesion ([place]) 02/02/2017  . Aortic atherosclerosis ([place]) 09/30/2016  . Orthostatic hypotension 09/30/2016  . Hypokalemia 09/30/2016  . Diabetic foot ulcer associated with diabetes mellitus due to underlying condition ([place]) 07/08/2016  . CKD (chronic kidney disease) stage 3, GFR 30-59 ml/min (HCC) 06/19/2016  . Human immunodeficiency virus (HIV) disease ([place]) 04/23/2016  . Gastroparesis   . Moderate nonproliferative diabetic retinopathy of both eyes ([place]) 11/21/2014  . Esophageal reflux   . Microscopic hematuria 10/31/2014  . Essential hypertension 11/16/2013  . TOBACCO USER 06/28/2006  . Depression 06/28/2006  . Diabetes mellitus type 2 in obese [facility]) 06/28/1994   Past Medical History:  Diagnosis Date  . CKD (chronic kidney disease) stage 3, GFR 30-59 ml/min (HCC) 06/19/2016   06/30/16: Seen by Dr. Posey Pronto [Nephrology]. Suspect etiologies to include diabetes and hypertension though cannot exclude HIV-associated nephropathy. Advised her against from using excessive Goody powder.  . CKD (chronic kidney disease), stage III ([place])   . Contact dermatitis 04/21/2016   07/29/16: Skin biopsy performed by [facility] notable for acute spongiotic dermatitis consistent with contact dermatitis.  . Cyclical vomiting    THC induced???  . Depression   . Depression 06/28/2006   Qualifier: Diagnosis of  By: Riccardo Dubin MD, Sherren Mocha    . Diabetes mellitus type 2 in obese ([place]) 06/28/1994  . DKA (diabetic ketoacidoses) ([place]) 12/2014  . Erosive esophagitis   . Esophageal reflux   .  Essential hypertension 11/16/2013  . Gastroparesis    ? diabetic  . GERD (gastroesophageal reflux disease)   . Human immunodeficiency virus (HIV) disease ([place]) dx'd 04/2016  . Human immunodeficiency virus (HIV) disease ([place]) 04/23/2016  . Hypertension   . Moderate nonproliferative diabetic retinopathy of both eyes ([place]) 11/21/2014   11/14/14: Noted on retinal imaging; needs follow-up imaging in 6 months  05/22/16: Noted on retinal imaging again; needs follow-up imaging in 6 months  . Normocytic anemia   . TOBACCO USER 06/28/2006   Qualifier: Diagnosis of  By: Riccardo Dubin MD, Sherren Mocha    . Type II diabetes mellitus ([place]) 1994   diagnosed around 80    Family History  Problem Relation Age of Onset  . Diabetes Mother   .  Diabetes Brother   . Diabetes Daughter   . Diabetes Daughter   . Mental retardation Brother        died from PNA  . Diabetes Maternal Grandmother     Past Surgical History:  Procedure Laterality Date  . AMPUTATION Left 07/08/2018   Procedure: AMPUTATION FORTH RAY LEFT FOOT;  Surgeon: [name], MD;  Location: [facility];  Service: Orthopedics;  Laterality: Left;  . LOWER EXTREMITY ANGIOGRAPHY N/A 07/05/2018   Procedure: LOWER EXTREMITY ANGIOGRAPHY;  Surgeon: Serafina Mitchell, MD;  Location: [facility];  Service: Cardiovascular;  Laterality: N/A;  . PERIPHERAL VASCULAR BALLOON ANGIOPLASTY Left 07/05/2018   Procedure: PERIPHERAL VASCULAR BALLOON ANGIOPLASTY;  Surgeon: Serafina Mitchell, MD;  Location: [facility];  Service: Cardiovascular;  Laterality: Left;  SFA  . TUBAL LIGATION  2002   Social History   Occupational History    Employer: UNEMPLOYED  Tobacco Use  . Smoking status: Former Smoker    Packs/day: 0.10    Years: 10.00    Pack years: 1.00    Types: Cigarettes    Start date: 11/17/2013    Last attempt to quit: 09/09/2014    Years since quitting: 3.8  . Smokeless tobacco: Never Used  Substance and Sexual Activity  . Alcohol use: Yes    Comment:  Occasionally.  . Drug use: Yes    Frequency: 4.0 times per week    Types: Marijuana  . Sexual activity: Not on file

## 2018-08-05 ENCOUNTER — Inpatient Hospital Stay: Payer: Medicaid Other | Admitting: Infectious Diseases

## 2018-08-06 ENCOUNTER — Emergency Department (HOSPITAL_COMMUNITY): Payer: Medicaid Other

## 2018-08-06 ENCOUNTER — Inpatient Hospital Stay (HOSPITAL_COMMUNITY)
Admission: EM | Admit: 2018-08-06 | Discharge: 2018-08-12 | DRG: 907 | Disposition: A | Discharge status: Home / Self Care | Payer: Medicaid Other | Attending: Internal Medicine | Admitting: Internal Medicine

## 2018-08-06 DIAGNOSIS — L03116 Cellulitis of left lower limb: Secondary | ICD-10-CM | POA: Diagnosis present

## 2018-08-06 DIAGNOSIS — Z79899 Other long term (current) drug therapy: Secondary | ICD-10-CM

## 2018-08-06 DIAGNOSIS — E875 Hyperkalemia: Secondary | ICD-10-CM | POA: Diagnosis not present

## 2018-08-06 DIAGNOSIS — R222 Localized swelling, mass and lump, trunk: Secondary | ICD-10-CM

## 2018-08-06 DIAGNOSIS — L089 Local infection of the skin and subcutaneous tissue, unspecified: Secondary | ICD-10-CM

## 2018-08-06 DIAGNOSIS — E43 Unspecified severe protein-calorie malnutrition: Secondary | ICD-10-CM | POA: Diagnosis present

## 2018-08-06 DIAGNOSIS — E669 Obesity, unspecified: Secondary | ICD-10-CM | POA: Diagnosis present

## 2018-08-06 DIAGNOSIS — Z992 Dependence on renal dialysis: Secondary | ICD-10-CM | POA: Diagnosis not present

## 2018-08-06 DIAGNOSIS — Z7982 Long term (current) use of aspirin: Secondary | ICD-10-CM | POA: Diagnosis not present

## 2018-08-06 DIAGNOSIS — F329 Major depressive disorder, single episode, unspecified: Secondary | ICD-10-CM | POA: Diagnosis present

## 2018-08-06 DIAGNOSIS — D631 Anemia in chronic kidney disease: Secondary | ICD-10-CM | POA: Diagnosis present

## 2018-08-06 DIAGNOSIS — Z6827 Body mass index (BMI) 27.0-27.9, adult: Secondary | ICD-10-CM

## 2018-08-06 DIAGNOSIS — E113393 Type 2 diabetes mellitus with moderate nonproliferative diabetic retinopathy without macular edema, bilateral: Secondary | ICD-10-CM | POA: Diagnosis present

## 2018-08-06 DIAGNOSIS — T8781 Dehiscence of amputation stump: Secondary | ICD-10-CM | POA: Diagnosis not present

## 2018-08-06 DIAGNOSIS — T8744 Infection of amputation stump, left lower extremity: Secondary | ICD-10-CM | POA: Diagnosis present

## 2018-08-06 DIAGNOSIS — E1143 Type 2 diabetes mellitus with diabetic autonomic (poly)neuropathy: Secondary | ICD-10-CM | POA: Diagnosis present

## 2018-08-06 DIAGNOSIS — Z87891 Personal history of nicotine dependence: Secondary | ICD-10-CM | POA: Diagnosis not present

## 2018-08-06 DIAGNOSIS — E1122 Type 2 diabetes mellitus with diabetic chronic kidney disease: Secondary | ICD-10-CM | POA: Diagnosis present

## 2018-08-06 DIAGNOSIS — E1151 Type 2 diabetes mellitus with diabetic peripheral angiopathy without gangrene: Secondary | ICD-10-CM | POA: Diagnosis present

## 2018-08-06 DIAGNOSIS — T8130XA Disruption of wound, unspecified, initial encounter: Secondary | ICD-10-CM | POA: Diagnosis present

## 2018-08-06 DIAGNOSIS — Z8739 Personal history of other diseases of the musculoskeletal system and connective tissue: Secondary | ICD-10-CM

## 2018-08-06 DIAGNOSIS — E872 Acidosis: Secondary | ICD-10-CM | POA: Diagnosis not present

## 2018-08-06 DIAGNOSIS — F339 Major depressive disorder, recurrent, unspecified: Secondary | ICD-10-CM | POA: Diagnosis not present

## 2018-08-06 DIAGNOSIS — B20 Human immunodeficiency virus [HIV] disease: Secondary | ICD-10-CM | POA: Diagnosis present

## 2018-08-06 DIAGNOSIS — K5900 Constipation, unspecified: Secondary | ICD-10-CM | POA: Diagnosis not present

## 2018-08-06 DIAGNOSIS — T8131XA Disruption of external operation (surgical) wound, not elsewhere classified, initial encounter: Principal | ICD-10-CM | POA: Diagnosis present

## 2018-08-06 DIAGNOSIS — Y835 Amputation of limb(s) as the cause of abnormal reaction of the patient, or of later complication, without mention of misadventure at the time of the procedure: Secondary | ICD-10-CM | POA: Diagnosis present

## 2018-08-06 DIAGNOSIS — E1169 Type 2 diabetes mellitus with other specified complication: Secondary | ICD-10-CM | POA: Diagnosis present

## 2018-08-06 DIAGNOSIS — D649 Anemia, unspecified: Secondary | ICD-10-CM | POA: Diagnosis present

## 2018-08-06 DIAGNOSIS — N185 Chronic kidney disease, stage 5: Secondary | ICD-10-CM

## 2018-08-06 DIAGNOSIS — I120 Hypertensive chronic kidney disease with stage 5 chronic kidney disease or end stage renal disease: Secondary | ICD-10-CM | POA: Diagnosis present

## 2018-08-06 DIAGNOSIS — Z794 Long term (current) use of insulin: Secondary | ICD-10-CM | POA: Diagnosis not present

## 2018-08-06 DIAGNOSIS — L03119 Cellulitis of unspecified part of limb: Secondary | ICD-10-CM

## 2018-08-06 DIAGNOSIS — E119 Type 2 diabetes mellitus without complications: Secondary | ICD-10-CM | POA: Diagnosis present

## 2018-08-06 DIAGNOSIS — T148XXA Other injury of unspecified body region, initial encounter: Secondary | ICD-10-CM

## 2018-08-06 DIAGNOSIS — N6321 Unspecified lump in the left breast, upper outer quadrant: Secondary | ICD-10-CM | POA: Diagnosis present

## 2018-08-06 DIAGNOSIS — Z89422 Acquired absence of other left toe(s): Secondary | ICD-10-CM | POA: Diagnosis not present

## 2018-08-06 DIAGNOSIS — T402X5A Adverse effect of other opioids, initial encounter: Secondary | ICD-10-CM | POA: Diagnosis present

## 2018-08-06 DIAGNOSIS — F32A Depression, unspecified: Secondary | ICD-10-CM

## 2018-08-06 DIAGNOSIS — N6320 Unspecified lump in the left breast, unspecified quadrant: Secondary | ICD-10-CM

## 2018-08-06 DIAGNOSIS — K5903 Drug induced constipation: Secondary | ICD-10-CM | POA: Diagnosis present

## 2018-08-06 DIAGNOSIS — E11628 Type 2 diabetes mellitus with other skin complications: Secondary | ICD-10-CM | POA: Insufficient documentation

## 2018-08-06 DIAGNOSIS — N186 End stage renal disease: Secondary | ICD-10-CM | POA: Diagnosis present

## 2018-08-06 LAB — CBC WITH DIFFERENTIAL/PLATELET
Abs Immature Granulocytes: 0.02 10*3/uL (ref 0.00–0.07)
Basophils Absolute: 0 10*3/uL (ref 0.0–0.1)
Basophils Relative: 0 %
Eosinophils Absolute: 0.4 10*3/uL (ref 0.0–0.5)
Eosinophils Relative: 6 %
HCT: 32.4 % — ABNORMAL LOW (ref 36.0–46.0)
Hemoglobin: 10.3 g/dL — ABNORMAL LOW (ref 12.0–15.0)
Immature Granulocytes: 0 %
Lymphocytes Relative: 20 %
Lymphs Abs: 1.5 10*3/uL (ref 0.7–4.0)
MCH: 29.5 pg (ref 26.0–34.0)
MCHC: 31.8 g/dL (ref 30.0–36.0)
MCV: 92.8 fL (ref 80.0–100.0)
Monocytes Absolute: 0.4 10*3/uL (ref 0.1–1.0)
Monocytes Relative: 6 %
Neutro Abs: 4.9 10*3/uL (ref 1.7–7.7)
Neutrophils Relative %: 68 %
Platelets: 289 10*3/uL (ref 150–400)
RBC: 3.49 MIL/uL — ABNORMAL LOW (ref 3.87–5.11)
RDW: 14.6 % (ref 11.5–15.5)
WBC: 7.3 10*3/uL (ref 4.0–10.5)
nRBC: 0 % (ref 0.0–0.2)

## 2018-08-06 LAB — COMPREHENSIVE METABOLIC PANEL
ALT: 10 U/L (ref 0–44)
AST: 13 U/L — ABNORMAL LOW (ref 15–41)
Albumin: 3 g/dL — ABNORMAL LOW (ref 3.5–5.0)
Alkaline Phosphatase: 101 U/L (ref 38–126)
Anion gap: 11 (ref 5–15)
BUN: 39 mg/dL — ABNORMAL HIGH (ref 6–20)
CO2: 20 mmol/L — ABNORMAL LOW (ref 22–32)
Calcium: 8.6 mg/dL — ABNORMAL LOW (ref 8.9–10.3)
Chloride: 104 mmol/L (ref 98–111)
Creatinine, Ser: 5.37 mg/dL — ABNORMAL HIGH (ref 0.44–1.00)
GFR calc Af Amer: 11 mL/min — ABNORMAL LOW (ref >60)
GFR calc non Af Amer: 9 mL/min — ABNORMAL LOW (ref >60)
Glucose, Bld: 235 mg/dL — ABNORMAL HIGH (ref 70–99)
Potassium: 5.1 mmol/L (ref 3.5–5.1)
Sodium: 135 mmol/L (ref 135–145)
Total Bilirubin: 0.4 mg/dL (ref 0.3–1.2)
Total Protein: 7.9 g/dL (ref 6.5–8.1)

## 2018-08-06 LAB — I-STAT BETA HCG BLOOD, ED (MC, WL, AP ONLY): I-stat hCG, quantitative: <5 m[IU]/mL (ref <5)

## 2018-08-06 LAB — HEMOGLOBIN A1C
Hgb A1c MFr Bld: 6.5 % — ABNORMAL HIGH (ref 4.8–5.6)
Mean Plasma Glucose: 139.85 mg/dL

## 2018-08-06 LAB — I-STAT CG4 LACTIC ACID, ED: Lactic Acid, Venous: 1.13 mmol/L (ref 0.5–1.9)

## 2018-08-06 LAB — GLUCOSE, CAPILLARY: Glucose-Capillary: 229 mg/dL — ABNORMAL HIGH (ref 70–99)

## 2018-08-06 LAB — SEDIMENTATION RATE: Sed Rate: 21 mm/hr (ref 0–22)

## 2018-08-06 LAB — C-REACTIVE PROTEIN: CRP: <0.8 mg/dL (ref <1.0)

## 2018-08-06 MED ORDER — DOXYCYCLINE HYCLATE 100 MG PO TABS
100.0000 mg | ORAL_TABLET | Freq: Two times a day (BID) | ORAL | Status: DC
  Administered 2018-08-07 (×2): 100 mg via ORAL
  Filled 2018-08-06 (×2): qty 1

## 2018-08-06 MED ORDER — INSULIN ASPART 100 UNIT/ML ~~LOC~~ SOLN
0.0000 [IU] | Freq: Three times a day (TID) | SUBCUTANEOUS | Status: DC
  Administered 2018-08-07: 2 [IU] via SUBCUTANEOUS
  Administered 2018-08-07 – 2018-08-12 (×11): 1 [IU] via SUBCUTANEOUS

## 2018-08-06 MED ORDER — PIPERACILLIN-TAZOBACTAM 3.375 G IVPB
3.3750 g | Freq: Two times a day (BID) | INTRAVENOUS | Status: DC
  Administered 2018-08-07: 3.375 g via INTRAVENOUS
  Filled 2018-08-06: qty 50

## 2018-08-06 MED ORDER — ONDANSETRON HCL 4 MG/2ML IJ SOLN
4.0000 mg | Freq: Once | INTRAMUSCULAR | Status: AC
  Administered 2018-08-06: 4 mg via INTRAVENOUS
  Filled 2018-08-06: qty 2

## 2018-08-06 MED ORDER — PIPERACILLIN-TAZOBACTAM 3.375 G IVPB 30 MIN
3.3750 g | Freq: Once | INTRAVENOUS | Status: AC
  Administered 2018-08-06: 3.375 g via INTRAVENOUS
  Filled 2018-08-06: qty 50

## 2018-08-06 MED ORDER — DOLUTEGRAVIR-RILPIVIRINE 50-25 MG PO TABS: 1.0000 | ORAL_TABLET | Freq: Every day | ORAL | Status: DC

## 2018-08-06 MED ORDER — VANCOMYCIN HCL 10 G IV SOLR
1500.0000 mg | Freq: Once | INTRAVENOUS | Status: AC
  Administered 2018-08-06: 1500 mg via INTRAVENOUS
  Filled 2018-08-06: qty 1500

## 2018-08-06 MED ORDER — SODIUM BICARBONATE 650 MG PO TABS
650.0000 mg | ORAL_TABLET | Freq: Two times a day (BID) | ORAL | Status: DC
  Administered 2018-08-07 – 2018-08-12 (×12): 650 mg via ORAL
  Filled 2018-08-06 (×12): qty 1

## 2018-08-06 MED ORDER — ADULT MULTIVITAMIN W/MINERALS CH
1.0000 | ORAL_TABLET | Freq: Every day | ORAL | Status: DC
  Administered 2018-08-07 – 2018-08-12 (×6): 1 via ORAL
  Filled 2018-08-06 (×6): qty 1

## 2018-08-06 MED ORDER — LABETALOL HCL 100 MG PO TABS
100.0000 mg | ORAL_TABLET | Freq: Two times a day (BID) | ORAL | Status: DC
  Administered 2018-08-07 – 2018-08-12 (×12): 100 mg via ORAL
  Filled 2018-08-06 (×2): qty 1
  Filled 2018-08-06 (×4): qty 0.5
  Filled 2018-08-06 (×4): qty 1
  Filled 2018-08-06 (×2): qty 0.5

## 2018-08-06 MED ORDER — VANCOMYCIN HCL IN DEXTROSE 1-5 GM/200ML-% IV SOLN: 1000.0000 mg | Freq: Once | INTRAVENOUS | Status: DC

## 2018-08-06 MED ORDER — RILPIVIRINE HCL 25 MG PO TABS
25.0000 mg | ORAL_TABLET | Freq: Every day | ORAL | Status: DC
  Administered 2018-08-07 – 2018-08-12 (×6): 25 mg via ORAL
  Filled 2018-08-06 (×6): qty 1

## 2018-08-06 MED ORDER — HEPARIN SODIUM (PORCINE) 5000 UNIT/ML IJ SOLN
5000.0000 [IU] | Freq: Three times a day (TID) | INTRAMUSCULAR | Status: DC
  Administered 2018-08-07 – 2018-08-12 (×15): 5000 [IU] via SUBCUTANEOUS
  Filled 2018-08-06 (×15): qty 1

## 2018-08-06 MED ORDER — OXYCODONE-ACETAMINOPHEN 5-325 MG PO TABS
1.0000 | ORAL_TABLET | Freq: Four times a day (QID) | ORAL | Status: DC | PRN
  Administered 2018-08-07 (×2): 1 via ORAL
  Filled 2018-08-06 (×3): qty 1

## 2018-08-06 MED ORDER — AMLODIPINE BESYLATE 10 MG PO TABS
10.0000 mg | ORAL_TABLET | Freq: Every day | ORAL | Status: DC
  Administered 2018-08-07 – 2018-08-12 (×6): 10 mg via ORAL
  Filled 2018-08-06: qty 1
  Filled 2018-08-06 (×2): qty 2
  Filled 2018-08-06 (×2): qty 1
  Filled 2018-08-06: qty 2

## 2018-08-06 MED ORDER — ASPIRIN 81 MG PO CHEW
81.0000 mg | CHEWABLE_TABLET | Freq: Every day | ORAL | Status: DC
  Administered 2018-08-08 – 2018-08-12 (×5): 81 mg via ORAL
  Filled 2018-08-06 (×5): qty 1

## 2018-08-06 MED ORDER — INSULIN GLARGINE 100 UNIT/ML ~~LOC~~ SOLN
7.0000 [IU] | Freq: Every day | SUBCUTANEOUS | Status: DC
  Administered 2018-08-07 – 2018-08-08 (×2): 7 [IU] via SUBCUTANEOUS
  Filled 2018-08-06 (×2): qty 0.07

## 2018-08-06 MED ORDER — MORPHINE SULFATE (PF) 4 MG/ML IV SOLN
4.0000 mg | Freq: Once | INTRAVENOUS | Status: AC
  Administered 2018-08-06: 4 mg via INTRAVENOUS
  Filled 2018-08-06: qty 1

## 2018-08-06 MED ORDER — TRIAMCINOLONE ACETONIDE 0.1 % EX CREA
1.0000 "application " | TOPICAL_CREAM | Freq: Two times a day (BID) | CUTANEOUS | Status: DC | PRN
  Administered 2018-08-07: 1 via TOPICAL
  Filled 2018-08-06: qty 15

## 2018-08-06 MED ORDER — DOLUTEGRAVIR SODIUM 50 MG PO TABS
50.0000 mg | ORAL_TABLET | Freq: Every day | ORAL | Status: DC
  Administered 2018-08-07 – 2018-08-12 (×6): 50 mg via ORAL
  Filled 2018-08-06 (×6): qty 1

## 2018-08-06 NOTE — H&P (Signed)
Date: 08/06/2018               Patient Name:  Kathy Frank MRN: [id number]  DOB: [birth year] Age / Sex: 42 y.o., female   PCP: Lars Mage, MD         Medical Service: Internal Medicine Teaching Service         Attending Physician: Dr. Evette Doffing, Mallie Mussel, *    First Contact: Dr. Alfonse Spruce Pager: [phone]  Second Contact: Dr. Laurin Coder Pager: [phone]       After Hours (After 5p/  First Contact Pager: [phone]  weekends / holidays): Second Contact Pager: [phone]   Chief Complaint: lower extremity infection and pain  History of Present Illness:  Ms. 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 She stated that there has been notable pain at the site since her surgery with associated opening of the wound that has progressively worsened at an ever exceeding rate most prominently as of 5 days prior, the day she was last seen by her orthopedic surgeon. She was prescribed doxycycline and Silvadene cream on 08/01/2018 which she attests to compliance of. The foul drainage, pain and swelling has worsened since that time. She is most concerned with the wound opening and the 8/10 sharp pain.   Her above symptoms are accompanied by nonbloody diarrhea x 3 days, a single right calf cramp x 2 days prior and a brief episode of nausea that self resolved. She denied fever, chills, chest pain, cough, headache, visual changes, weakness, muscles aches, streaks on her leg, other similar lesions or ulcers, rash, abdominal pain, recent known sick contacts.   IN the ED she was stated on Vancomycin and Zosyn. As per the ED clinician Dr. Lorin Mercy of Orthopedics recommended admission and will consult on the patient following such. He patients ESR, CRP and Foot DG were not remarkable for osteomyelitis.  Meds:  Current Meds  Medication Sig  .  amLODipine (NORVASC) 10 MG tablet Take 1 tablet (10 mg total) by mouth daily.  Marland Kitchen aspirin 81 MG EC tablet Take 1 tablet (81 mg total) by mouth daily.  . Dolutegravir-Rilpivirine (JULUCA) 50-25 MG TABS Take 1 tablet by mouth daily. Please take with largest meal of the day.  Marland Kitchen doxycycline (VIBRA-TABS) 100 MG tablet Take 1 tablet (100 mg total) by mouth 2 (two) times daily.  . Insulin Glargine (LANTUS) 100 UNIT/ML Solostar Pen Inject 15 Units into the skin daily at 10 pm. (Patient taking differently: Inject 15 Units into the skin at bedtime. )  . labetalol (NORMODYNE) 100 MG tablet TAKE 1 TABLET BY MOUTH 2 TIMES DAILY (Patient taking differently: Take 100 mg by mouth 2 (two) times daily. )  . liraglutide (VICTOZA) 18 MG/3ML SOPN Inject 0.2 mLs (1.2 mg total) into the skin daily.  . Multiple Vitamin (MULTIVITAMIN WITH MINERALS) TABS tablet Take 1 tablet by mouth daily.  Marland Kitchen oxyCODONE-acetaminophen (PERCOCET/ROXICET) 5-325 MG tablet Take 1 tablet by mouth every 6 (six) hours as needed for moderate pain or severe pain.  . silver sulfADIAZINE (SILVADENE) 1 % cream Apply 1 application topically daily.  . sodium bicarbonate 650 MG tablet Take 1 tablet (650 mg total) by mouth 2 (two) times daily.  Marland Kitchen triamcinolone cream (KENALOG) 0.1 % Apply 1 application topically 2 (two) times daily. (Patient taking differently: Apply 1  application topically 2 (two) times daily as needed (itching). )   Allergies: Allergies as of 08/06/2018  . (No Known Allergies)   Past Medical History:  Diagnosis Date  . CKD (chronic kidney disease) stage 3, GFR 30-59 ml/min (HCC) 06/19/2016   06/30/16: Seen by Dr. Posey Pronto [Nephrology]. Suspect etiologies to include diabetes and hypertension though cannot exclude HIV-associated nephropathy. Advised her against from using excessive Goody powder.  . CKD (chronic kidney disease), stage III ([place])   . Contact dermatitis 04/21/2016   07/29/16: Skin biopsy performed by [facility]  notable for acute spongiotic dermatitis consistent with contact dermatitis.  . Cyclical vomiting    THC induced???  . Depression   . Depression 06/28/2006   Qualifier: Diagnosis of  By: Riccardo Dubin MD, Sherren Mocha    . Diabetes mellitus type 2 in obese ([place]) 06/28/1994  . DKA (diabetic ketoacidoses) ([place]) 12/2014  . Erosive esophagitis   . Esophageal reflux   . Essential hypertension 11/16/2013  . Gastroparesis    ? diabetic  . GERD (gastroesophageal reflux disease)   . Human immunodeficiency virus (HIV) disease ([place]) dx'd 04/2016  . Human immunodeficiency virus (HIV) disease ([place]) 04/23/2016  . Hypertension   . Moderate nonproliferative diabetic retinopathy of both eyes ([place]) 11/21/2014   11/14/14: Noted on retinal imaging; needs follow-up imaging in 6 months  05/22/16: Noted on retinal imaging again; needs follow-up imaging in 6 months  . Normocytic anemia   . TOBACCO USER 06/28/2006   Qualifier: Diagnosis of  By: Riccardo Dubin MD, Sherren Mocha    . Type II diabetes mellitus ([place]) 1994   diagnosed around 54   Social: Denied Tobacco, EtOH, lives at home alone, does not have assistance at home and can not stay off her foot.   Family History:  Family History  Problem Relation Age of Onset  . Diabetes Mother   . Diabetes Brother   . Diabetes Daughter   . Diabetes Daughter   . Mental retardation Brother        died from PNA  . Diabetes Maternal Grandmother    Review of Systems: A complete ROS was negative except as per HPI.   Physical Exam: Blood pressure 139/82, pulse 90, temperature 98.6 F (37 C), temperature source Oral, resp. rate 18, SpO2 100 %. Physical Exam Constitutional:      General: She is not in acute distress.    Appearance: She is well-developed. She is not ill-appearing or diaphoretic.  HENT:     Head: Normocephalic and atraumatic.  Eyes:     Conjunctiva/sclera: Conjunctivae normal.  Neck:     Musculoskeletal: Normal range of motion and neck supple.  Cardiovascular:     Rate  and Rhythm: Normal rate and regular rhythm.     Heart sounds: No murmur.  Pulmonary:     Effort: Pulmonary effort is normal. No respiratory distress.     Breath sounds: Normal breath sounds. No stridor.  Chest:     Chest wall: Mass present.       Comments: Small, slightly tender, mobile mass, without corresponding skin changes measuring roughly 3x3cm inferiorly to the left midclavicular region. No associated lymphadenopathy noted in the left breast or axilla.  Abdominal:     General: Bowel sounds are normal. There is no distension.     Palpations: Abdomen is soft.     Tenderness: There is no abdominal tenderness.  Musculoskeletal:        General: Swelling (Purulent drainage from the left foot, site of the 4th  digit ray amputation, mild associated edema, no notable erythema), tenderness and deformity (See image) present.  Skin:    General: Skin is warm.     Capillary Refill: Capillary refill takes less than 2 seconds.  Neurological:     Mental Status: She is alert and oriented to person, place, and time.     EKG: personally reviewed my interpretation is n/a  CXR: personally reviewed my interpretation is n/a  Xray Foot 12/28 No radiographic evidence of osteomyelitis.  Assessment & Plan by Problem: Active Problems:   Diabetes mellitus type 2 in obese [facility])   Essential hypertension   Human immunodeficiency virus (HIV) disease ([place])   CKD (chronic kidney disease), stage V (HCC)   Cellulitis in diabetic foot ([place])  Assessment: Demetrice Amstutz is a 42 yo F who presents with progressively worsening left lower foot pain and wound dehiscence. This appears to be 2/2 to poor peripheral circulation as per chart review and poor wound healing as a result of her inability to refrain from excessive use of the limb. There is currently no significant laboratory or imaging evidence of osteomyelitis or systemic infection, however, given her physical exam with the extent of the wound, her risk  factors of HIV and DMII, as well as her recent hospitalization with treatment consisting of broad spectrum IV antibiotics including Cefepime and Vancomycin, she will be continued on coverage for MRSA and pseudomonas and admitted to the hospital where orthopedics as agreed to assess her for consideration of additional intervention if indicated.   Plan: Diabetic foot wound: Ortho has reportedly agreed to see her during this admission.  -Continue IV zosyn 3.375g Q 12 hours -PO doxycycline '100mg'$  BID -CBC and CMP in am -Admit to med-surg floor -Will follow-up with wound care consult pending orthopedic evaluation -Recommend dry dressing this PM -Oxycodone '5mg'$  q6 hours PRN for Pain -NPO after midnight  CKD V: Serum Cr 5.37, insignificantly down from 5.94 3 weeks prior. Remained stable. Will stop the Vanc/Zosyn combo on admission to prevent possible worsening of her renal function as she continues to produce adequate urine at this time. She is undergoing completion of her impending dialysis treatment. -CMP in am -Avoid nephrotoxic agents -Continue sodium bicarbonate  DMII: On Victoza and Lantus at home. Holding Victoza while admitted. -Continue Lanuts, 1/2 dose this PM in anticipation of Orthopedic evaluation in am.  -SSI meal time insulin -Renal, carb modified diet  HIV: Most recent CD4 450 in 12/23/2017.  -Continue home Dolutegravir-rilpivirine -Patient will need follow-up appointment with ID as she missed her appointment on 08/05/2018 due to her foot pain.  HTN: BP 139/82. Stable. Continue Amlodipine. Continue Labetalol '100mg'$  BID  Left Breast mass: Will recommend outpatient follow up.  Diet: Renal, Carb modified, fluid restricted VTE: Heparin Sub cutaneous IVF: N/A Code: FULL Dispo: Admit patient to Inpatient with expected length of stay greater than 2 midnights.  Signed: [name], MD 08/06/2018, 11:04 PM  Pager: [phone]

## 2018-08-06 NOTE — ED Triage Notes (Signed)
Pt endorses infection to 4th toe on left foot where she had amputation 2 months ago. Pt complains of pus and foul smell to area. VSS.

## 2018-08-06 NOTE — ED Triage Notes (Signed)
Pt reports to be a hard stick. Iv site attempted by 2 Rn's . Unable to obtain site.

## 2018-08-06 NOTE — ED Provider Notes (Signed)
[facility] EMERGENCY DEPARTMENT Provider Note   CSN: [id number] Arrival date & time: 08/06/18  1529     History   Chief Complaint Chief Complaint  Patient presents with  . Foot Pain  . Wound Infection    HPI Kathy Frank is a 42 y.o. female with a PMHx of CKD3, DM2, GERD, HTN, HIV, chronic anemia, and other conditions listed below, with recent PSHx of L 4th ray amputation by Dr. Sharol Given on 07/08/18 for osteomyelitis (admission 07/02/18 for sepsis related to this), who presents to the ED with complaints of worsening left foot wound x1 week.  Patient states that her amputation wound opened up 1 week ago and started having drainage, she was seen by Dr. Kandace Blitz at [facility] orthopedics on 08/01/2018 and she was started on doxycycline 100 mg twice daily for 14 days as well as Silvadene cream.  She has been using these as directed, but states that the wound continues to open up, and is having worsening drainage, pain, and swelling.  She describes the pain as 8/10 constant sharp and throbbing nonradiating left foot pain that worsens with walking, with no treatments tried prior to arrival.  She reports associated foul-smelling purulent drainage, swelling, warmth, and chills.  She also has been having nausea and has had 4 episodes of nonbloody diarrhea today as well as for the last several days.  She is unsure whether there is any erythema around the wound because she cannot tell.  She denies any known fevers, CP, SOB, abdominal pain, vomiting, constipation, hematochezia, melena, dysuria, hematuria, new or worsening numbness or tingling, focal weakness, or any other complaints at this time.  The history is provided by the patient and medical records. No language interpreter was used.  Foot Pain  Pertinent negatives include no chest pain, no abdominal pain and no shortness of breath.    Past Medical History:  Diagnosis Date  . CKD (chronic kidney disease) stage 3, GFR 30-59 ml/min  (HCC) 06/19/2016   06/30/16: Seen by Dr. Posey Pronto [Nephrology]. Suspect etiologies to include diabetes and hypertension though cannot exclude HIV-associated nephropathy. Advised her against from using excessive Goody powder.  . CKD (chronic kidney disease), stage III ([place])   . Contact dermatitis 04/21/2016   07/29/16: Skin biopsy performed by [facility] notable for acute spongiotic dermatitis consistent with contact dermatitis.  . Cyclical vomiting    THC induced???  . Depression   . Depression 06/28/2006   Qualifier: Diagnosis of  By: Riccardo Dubin MD, Sherren Mocha    . Diabetes mellitus type 2 in obese ([place]) 06/28/1994  . DKA (diabetic ketoacidoses) ([place]) 12/2014  . Erosive esophagitis   . Esophageal reflux   . Essential hypertension 11/16/2013  . Gastroparesis    ? diabetic  . GERD (gastroesophageal reflux disease)   . Human immunodeficiency virus (HIV) disease ([place]) dx'd 04/2016  . Human immunodeficiency virus (HIV) disease ([place]) 04/23/2016  . Hypertension   . Moderate nonproliferative diabetic retinopathy of both eyes ([place]) 11/21/2014   11/14/14: Noted on retinal imaging; needs follow-up imaging in 6 months  05/22/16: Noted on retinal imaging again; needs follow-up imaging in 6 months  . Normocytic anemia   . TOBACCO USER 06/28/2006   Qualifier: Diagnosis of  By: Riccardo Dubin MD, Sherren Mocha    . Type II diabetes mellitus ([place]) 1994   diagnosed around 1994    Patient Active Problem List   Diagnosis Date Noted  . Chronic kidney disease (CKD), stage IV (severe) ([place])   .  AKI (acute kidney injury) ([place]) 07/03/2018  . Osteomyelitis of foot, left, acute ([place]) 07/03/2018  . Acute on chronic anemia 07/03/2018  . Cellulitis of leg, left   . Fecal incontinence 02/04/2018  . Healthcare maintenance 09/17/2017  . Right leg swelling 02/26/2017  . Superficial ulcerative lesion ([place]) 02/02/2017  . Aortic atherosclerosis ([place]) 09/30/2016  . Orthostatic hypotension 09/30/2016  . Hypokalemia 09/30/2016  .  Diabetic foot ulcer associated with diabetes mellitus due to underlying condition ([place]) 07/08/2016  . CKD (chronic kidney disease) stage 3, GFR 30-59 ml/min (HCC) 06/19/2016  . Human immunodeficiency virus (HIV) disease ([place]) 04/23/2016  . Gastroparesis   . Moderate nonproliferative diabetic retinopathy of both eyes ([place]) 11/21/2014  . Esophageal reflux   . Microscopic hematuria 10/31/2014  . Essential hypertension 11/16/2013  . TOBACCO USER 06/28/2006  . Depression 06/28/2006  . Diabetes mellitus type 2 in obese [facility]) 06/28/1994    Past Surgical History:  Procedure Laterality Date  . AMPUTATION Left 07/08/2018   Procedure: AMPUTATION FORTH RAY LEFT FOOT;  Surgeon: [name], MD;  Location: [facility];  Service: Orthopedics;  Laterality: Left;  . LOWER EXTREMITY ANGIOGRAPHY N/A 07/05/2018   Procedure: LOWER EXTREMITY ANGIOGRAPHY;  Surgeon: Serafina Mitchell, MD;  Location: [facility];  Service: Cardiovascular;  Laterality: N/A;  . PERIPHERAL VASCULAR BALLOON ANGIOPLASTY Left 07/05/2018   Procedure: PERIPHERAL VASCULAR BALLOON ANGIOPLASTY;  Surgeon: Serafina Mitchell, MD;  Location: [facility];  Service: Cardiovascular;  Laterality: Left;  SFA  . TUBAL LIGATION  2002     OB History   No obstetric history on file.      Home Medications    Prior to Admission medications   Medication Sig Start Date End Date Taking? Authorizing Provider  amLODipine (NORVASC) 10 MG tablet Take 1 tablet (10 mg total) by mouth daily. 12/24/17 07/02/18  Collier Salina, MD  aspirin 81 MG EC tablet Take 1 tablet (81 mg total) by mouth daily. 07/12/18   Mosetta Anis, MD  citalopram (CELEXA) 20 MG tablet Take 1 tablet (20 mg total) by mouth daily. 12/24/17 12/24/18  Rice, Resa Miner, MD  diclofenac sodium (VOLTAREN) 1 % GEL Apply 2 g topically 4 (four) times daily. 12/24/17   Rice, Resa Miner, MD  Dolutegravir-Rilpivirine (JULUCA) 50-25 MG TABS Take 1 tablet by mouth daily. Please take  with largest meal of the day. 07/05/18   Annia Belt, MD  doxycycline (VIBRA-TABS) 100 MG tablet Take 1 tablet (100 mg total) by mouth 2 (two) times daily. 08/01/18   Rayburn, Neta Mends, PA-C  glucose blood (ACCU-CHEK GUIDE) test strip USE TO TEST BLOOD SUGAR 3 TIMES DAILY . Diagnosis code  E11.69, E66.9 07/09/17   Lars Mage, MD  Insulin Glargine (LANTUS) 100 UNIT/ML Solostar Pen Inject 15 Units into the skin daily at 10 pm. Patient taking differently: Inject 15 Units into the skin every morning.  12/24/17   Rice, Resa Miner, MD  labetalol (NORMODYNE) 100 MG tablet TAKE 1 TABLET BY MOUTH 2 TIMES DAILY 07/13/18   Chundi, Vahini, MD  liraglutide (VICTOZA) 18 MG/3ML SOPN Inject 0.2 mLs (1.2 mg total) into the skin daily. 02/04/18   Lars Mage, MD  Multiple Vitamin (MULTIVITAMIN WITH MINERALS) TABS tablet Take 1 tablet by mouth daily. 12/24/17   Rice, Resa Miner, MD  oxyCODONE-acetaminophen (PERCOCET/ROXICET) 5-325 MG tablet Take 1 tablet by mouth every 6 (six) hours as needed for moderate pain or severe pain. 08/01/18   Rayburn, Neta Mends,  PA-C  pantoprazole (PROTONIX) 40 MG tablet Take 1 tablet (40 mg total) by mouth daily. 12/24/17   Rice, Resa Miner, MD  sennosides-docusate sodium (SENOKOT-S) 8.6-50 MG tablet Take 1 tablet by mouth daily as needed for constipation. 12/24/17   Rice, Resa Miner, MD  silver sulfADIAZINE (SILVADENE) 1 % cream Apply 1 application topically daily. 08/01/18   Rayburn, Neta Mends, PA-C  sodium bicarbonate 650 MG tablet Take 1 tablet (650 mg total) by mouth 2 (two) times daily. 12/24/17   Rice, Resa Miner, MD  triamcinolone cream (KENALOG) 0.1 % Apply 1 application topically 2 (two) times daily. 02/04/18   Lars Mage, MD    Family History Family History  Problem Relation Age of Onset  . Diabetes Mother   . Diabetes Brother   . Diabetes Daughter   . Diabetes Daughter   . Mental retardation Brother        died from PNA    . Diabetes Maternal Grandmother     Social History Social History   Tobacco Use  . Smoking status: Former Smoker    Packs/day: 0.10    Years: 10.00    Pack years: 1.00    Types: Cigarettes    Start date: 11/17/2013    Last attempt to quit: 09/09/2014    Years since quitting: 3.9  . Smokeless tobacco: Never Used  Substance Use Topics  . Alcohol use: Yes    Comment: Occasionally.  . Drug use: Yes    Frequency: 4.0 times per week    Types: Marijuana     Allergies   Patient has no known allergies.   Review of Systems Review of Systems  Constitutional: Positive for chills. Negative for fever.  Respiratory: Negative for shortness of breath.   Cardiovascular: Negative for chest pain.  Gastrointestinal: Positive for diarrhea and nausea. Negative for abdominal pain, blood in stool, constipation and vomiting.  Genitourinary: Negative for dysuria and hematuria.  Musculoskeletal: Positive for arthralgias, joint swelling and myalgias.  Skin: Positive for wound.  Allergic/Immunologic: Positive for immunocompromised state (DM2, HIV).  Neurological: Negative for weakness and numbness.  Psychiatric/Behavioral: Negative for confusion.   All other systems reviewed and are negative for acute change except as noted in the HPI.    Physical Exam Updated Vital Signs BP 134/89 (BP Location: Right Arm)   Pulse 96   Temp 98.9 F (37.2 C) (Oral)   Resp 16   SpO2 98%   Physical Exam Vitals signs and nursing note reviewed.  Constitutional:      General: She is not in acute distress.    Appearance: Normal appearance. She is well-developed. She is not toxic-appearing.     Comments: Afebrile, nontoxic, NAD  HENT:     Head: Normocephalic and atraumatic.  Eyes:     General:        Right eye: No discharge.        Left eye: No discharge.     Conjunctiva/sclera: Conjunctivae normal.  Neck:     Musculoskeletal: Normal range of motion and neck supple.  Cardiovascular:     Rate and  Rhythm: Normal rate and regular rhythm.     Heart sounds: Normal heart sounds, S1 normal and S2 normal. No murmur. No friction rub. No gallop.   Pulmonary:     Effort: Pulmonary effort is normal. No respiratory distress.     Breath sounds: Normal breath sounds. No decreased breath sounds, wheezing, rhonchi or rales.  Abdominal:     General: Bowel sounds are normal.  There is no distension.     Palpations: Abdomen is soft. Abdomen is not rigid.     Tenderness: There is no abdominal tenderness. There is no right CVA tenderness, left CVA tenderness, guarding or rebound. Negative signs include Murphy's sign and McBurney's sign.  Musculoskeletal: Normal range of motion.     Left foot: Tenderness, bony tenderness, swelling and laceration present.     Comments: L foot 4th digit amputation wound dehisced with foul smelling purulent drainage within the wound bed, moderately TTP around the wound and mid-foot, mild swelling, erythema, and warmth around the wound. SEE PICTURE BELOW.  Strength and sensation grossly intact, distal pulses intact, soft compartments.   Skin:    General: Skin is warm and dry.     Findings: Wound present. No rash.     Comments: L foot wound as mentioned above and pictured below  Neurological:     Mental Status: She is alert and oriented to person, place, and time.     Sensory: Sensation is intact. No sensory deficit.     Motor: Motor function is intact.  Psychiatric:        Mood and Affect: Mood and affect normal.        Behavior: Behavior normal.        ED Treatments / Results  Labs (all labs ordered are listed, but only abnormal results are displayed) Labs Reviewed  COMPREHENSIVE METABOLIC PANEL - Abnormal; Notable for the following components:      Result Value   CO2 20 (*)    Glucose, Bld 235 (*)    BUN 39 (*)    Creatinine, Ser 5.37 (*)    Calcium 8.6 (*)    Albumin 3.0 (*)    AST 13 (*)    GFR calc non Af Amer 9 (*)    GFR calc Af Amer 11 (*)    All  other components within normal limits  CBC WITH DIFFERENTIAL/PLATELET - Abnormal; Notable for the following components:   RBC 3.49 (*)    Hemoglobin 10.3 (*)    HCT 32.4 (*)    All other components within normal limits  C-REACTIVE PROTEIN  SEDIMENTATION RATE  I-STAT CG4 LACTIC ACID, ED  I-STAT BETA HCG BLOOD, ED ([facility], [facility], [facility] ONLY)  I-STAT CG4 LACTIC ACID, ED    EKG None  Radiology Dg Foot Complete Left  Result Date: 08/06/2018 CLINICAL DATA:  Draining wound at the site of previous amputation of distal fourth metatarsal and fourth toe. Fever. EXAM: LEFT FOOT - COMPLETE 3+ VIEW COMPARISON:  MRI dated 07/03/2018 FINDINGS: There is no radiographic evidence of osteomyelitis. Previous resection of the distal fourth metatarsal and fourth toe. There is a fairly sharp surgical margin in the fourth metatarsal. Small amount of gas in the soft tissues at the site of surgery. IMPRESSION: No radiographic evidence of osteomyelitis. Electronically Signed   By: Lorriane Shire M.D.   On: 08/06/2018 18:01    Procedures Procedures (including critical care time)  Medications Ordered in ED Medications  morphine 4 MG/ML injection 4 mg (4 mg Intravenous Given 08/06/18 1855)  ondansetron (ZOFRAN) injection 4 mg (4 mg Intravenous Given 08/06/18 1855)     Initial Impression / Assessment and Plan / ED Course  I have reviewed the triage vital signs and the nursing notes.  Pertinent labs & imaging results that were available during my care of the patient were reviewed by me and considered in my medical decision making (see chart for details).  42 y.o. female here with dehiscence of L foot wound from recent amputation, with drainage and pain worsening x1wk. On doxycycline and silvadene cream but still worsening. On exam, L foot with large wound dehisced, foul smelling purulent drainage, mild erythema and warmth around the area, mild swelling, moderate TTP. Will get labs, xray, give pain meds, and  reassess. Suspect we will likely need to talk to orthopedics and potentially admit for failure of outpatient management of her foot wound.   8:31 PM CBC w/diff with chronic stable anemia. CMP with gluc 235 but no anion gap, stable kidney function BUN 39/Cr 5.37. Lactic WNL. Beta HCG neg. CRP WNL. ESR WNL. L foot xray with small amount of gas in soft tissues at site of surgery but no evidence of osteomyelitis. Given worsening infection symptoms despite PO abx, will start IV abx and admit. Will consult orthopedist.   8:36 PM Dr. Lorin Mercy of ortho returning page, agrees with plan, will consult on her while she's admitted. Will consult internal medicine residency for admission.   9:00 PM Dr. Berline Lopes of IM medicine residency returning page and will admit. Holding orders to be placed by admitting team. Please see their notes for further documentation of care. I appreciate their help with this pleasant pt's care. Pt stable at time of admission.    Final Clinical Impressions(s) / ED Diagnoses   Final diagnoses:  Wound infection  Wound dehiscence    ED Discharge Orders    [address], [place], [place] 08/06/18 2100    Jola Schmidt, MD 08/07/18 [phone]

## 2018-08-06 NOTE — Progress Notes (Signed)
Pharmacy Antibiotic Note  ANIZA SHOR is a 42 y.o. female admitted on 08/06/2018 with wound infection.   Plan: Vanc 1500 mg x 1 Zosyn 3.375 gm iv q12h F/U further vanc dosing Monitor renal fx cx vanc lvls prn      Temp (24hrs), Avg:99 F (37.2 C), Min:98.9 F (37.2 C), Max:99.1 F (37.3 C)  Recent Labs  Lab 08/06/18 1629 08/06/18 1827  WBC 7.3  --   CREATININE 5.37*  --   LATICACIDVEN  --  1.13    Estimated Creatinine Clearance: 13.5 mL/min (A) (by C-G formula based on SCr of 5.37 mg/dL (H)).    No Known Allergies  Levester Fresh, PharmD, BCPS, BCCCP Clinical Pharmacist [phone]  Please check AMION for all [place] numbers  08/06/2018 8:37 PM

## 2018-08-07 ENCOUNTER — Other Ambulatory Visit: Payer: Self-pay

## 2018-08-07 DIAGNOSIS — E1143 Type 2 diabetes mellitus with diabetic autonomic (poly)neuropathy: Secondary | ICD-10-CM

## 2018-08-07 DIAGNOSIS — L089 Local infection of the skin and subcutaneous tissue, unspecified: Secondary | ICD-10-CM | POA: Diagnosis present

## 2018-08-07 DIAGNOSIS — T148XXA Other injury of unspecified body region, initial encounter: Secondary | ICD-10-CM

## 2018-08-07 DIAGNOSIS — T8130XA Disruption of wound, unspecified, initial encounter: Secondary | ICD-10-CM | POA: Diagnosis present

## 2018-08-07 LAB — CBC
HCT: 26.8 % — ABNORMAL LOW (ref 36.0–46.0)
Hemoglobin: 8.7 g/dL — ABNORMAL LOW (ref 12.0–15.0)
MCH: 29.9 pg (ref 26.0–34.0)
MCHC: 32.5 g/dL (ref 30.0–36.0)
MCV: 92.1 fL (ref 80.0–100.0)
Platelets: 255 10*3/uL (ref 150–400)
RBC: 2.91 MIL/uL — ABNORMAL LOW (ref 3.87–5.11)
RDW: 14.5 % (ref 11.5–15.5)
WBC: 6.9 10*3/uL (ref 4.0–10.5)
nRBC: 0 % (ref 0.0–0.2)

## 2018-08-07 LAB — COMPREHENSIVE METABOLIC PANEL
ALT: 10 U/L (ref 0–44)
AST: 10 U/L — ABNORMAL LOW (ref 15–41)
Albumin: 2.6 g/dL — ABNORMAL LOW (ref 3.5–5.0)
Alkaline Phosphatase: 90 U/L (ref 38–126)
Anion gap: 8 (ref 5–15)
BUN: 38 mg/dL — ABNORMAL HIGH (ref 6–20)
CO2: 21 mmol/L — ABNORMAL LOW (ref 22–32)
Calcium: 8.4 mg/dL — ABNORMAL LOW (ref 8.9–10.3)
Chloride: 107 mmol/L (ref 98–111)
Creatinine, Ser: 5.29 mg/dL — ABNORMAL HIGH (ref 0.44–1.00)
GFR calc Af Amer: 11 mL/min — ABNORMAL LOW (ref >60)
GFR calc non Af Amer: 9 mL/min — ABNORMAL LOW (ref >60)
Glucose, Bld: 161 mg/dL — ABNORMAL HIGH (ref 70–99)
Potassium: 5.1 mmol/L (ref 3.5–5.1)
Sodium: 136 mmol/L (ref 135–145)
Total Bilirubin: 0.3 mg/dL (ref 0.3–1.2)
Total Protein: 6.7 g/dL (ref 6.5–8.1)

## 2018-08-07 LAB — GLUCOSE, CAPILLARY
Glucose-Capillary: 141 mg/dL — ABNORMAL HIGH (ref 70–99)
Glucose-Capillary: 158 mg/dL — ABNORMAL HIGH (ref 70–99)
Glucose-Capillary: 139 mg/dL — ABNORMAL HIGH (ref 70–99)
Glucose-Capillary: 159 mg/dL — ABNORMAL HIGH (ref 70–99)

## 2018-08-07 LAB — SURGICAL PCR SCREEN
MRSA, PCR: NEGATIVE
Staphylococcus aureus: NEGATIVE

## 2018-08-07 MED ORDER — HYDROMORPHONE HCL 1 MG/ML IJ SOLN
0.5000 mg | INTRAMUSCULAR | Status: DC | PRN
  Administered 2018-08-07 – 2018-08-08 (×4): 0.5 mg via INTRAVENOUS
  Filled 2018-08-07 (×4): qty 1

## 2018-08-07 MED ORDER — VANCOMYCIN HCL IN DEXTROSE 1-5 GM/200ML-% IV SOLN: 1000.0000 mg | INTRAVENOUS | Status: DC

## 2018-08-07 MED ORDER — ONDANSETRON 4 MG PO TBDP
4.0000 mg | ORAL_TABLET | Freq: Three times a day (TID) | ORAL | Status: DC | PRN
  Administered 2018-08-07 – 2018-08-09 (×3): 4 mg via ORAL
  Filled 2018-08-07 (×3): qty 1

## 2018-08-07 NOTE — Progress Notes (Signed)
Pt vomited about 155mls of bright yellow emesis. Still feeling nauseous. RN paged MD awaiting call back about nausea medication IV

## 2018-08-07 NOTE — Progress Notes (Signed)
Pt stated she is depressed after hearing the news about her foot, and is crying and in distress.

## 2018-08-07 NOTE — Progress Notes (Signed)
Pt asked if RN could page the MD because she is still nauseous and the pain medication was not working and would like to know if she can get something IV to help manage both. RN paged IM awaiting call back

## 2018-08-07 NOTE — Progress Notes (Signed)
Pharmacy Antibiotic Note  Kathy Frank is a 42 y.o. female admitted on 08/06/2018 with wound infection.  Pharmacy has been consulted for Vancomycin dosing. Zosyn + vancomycin x1dose given on 12/28. Ortho following for possible fouth ray amputation of left foot.   Plan: Vancomycin 1000 IV every 48 hours.  Goal trough 15-20 mcg/mL. Continue Zosyn 3.375 g IV q12h Monitor renal function, cultures, and duration of therapy.     Temp (24hrs), Avg:98.9 F (37.2 C), Min:98.6 F (37 C), Max:99.1 F (37.3 C)  Recent Labs  Lab 08/06/18 1629 08/06/18 1827 08/07/18 0547  WBC 7.3  --  6.9  CREATININE 5.37*  --  5.29*  LATICACIDVEN  --  1.13  --     Estimated Creatinine Clearance: 13.7 mL/min (A) (by C-G formula based on SCr of 5.29 mg/dL (H)).    No Known Allergies  Antimicrobials this admission: Zosyn 12/28 >>  Vancomycin 12/28 >>    Microbiology results: 12/29 MRSA PCR: neg  Thank you for allowing pharmacy to be a part of this patient's care.  Gwenlyn Found, Sherian Rein D PGY1 Pharmacy Resident  Phone [phone] 08/07/2018   10:34 AM

## 2018-08-07 NOTE — Consult Note (Signed)
ORTHOPAEDIC CONSULTATION  REQUESTING PHYSICIAN: Axel Filler, *  Chief Complaint: Odor drainage wound dehiscence left foot fourth ray amputation.  HPI: Kathy Frank is a 42 y.o. female who presents with diabetes with severe peripheral vascular disease with stage III renal disease who presents with dehiscence of the fourth ray amputation.  There is odor and drainage.  Past Medical History:  Diagnosis Date  . CKD (chronic kidney disease) stage 3, GFR 30-59 ml/min (HCC) 06/19/2016   06/30/16: Seen by Dr. Posey Pronto [Nephrology]. Suspect etiologies to include diabetes and hypertension though cannot exclude HIV-associated nephropathy. Advised her against from using excessive Goody powder.  . CKD (chronic kidney disease), stage III ([place])   . Contact dermatitis 04/21/2016   07/29/16: Skin biopsy performed by [facility] notable for acute spongiotic dermatitis consistent with contact dermatitis.  . Cyclical vomiting    THC induced???  . Depression   . Depression 06/28/2006   Qualifier: Diagnosis of  By: Riccardo Dubin MD, Sherren Mocha    . Diabetes mellitus type 2 in obese ([place]) 06/28/1994  . DKA (diabetic ketoacidoses) ([place]) 12/2014  . Erosive esophagitis   . Esophageal reflux   . Essential hypertension 11/16/2013  . Gastroparesis    ? diabetic  . GERD (gastroesophageal reflux disease)   . Human immunodeficiency virus (HIV) disease ([place]) dx'd 04/2016  . Human immunodeficiency virus (HIV) disease ([place]) 04/23/2016  . Hypertension   . Moderate nonproliferative diabetic retinopathy of both eyes ([place]) 11/21/2014   11/14/14: Noted on retinal imaging; needs follow-up imaging in 6 months  05/22/16: Noted on retinal imaging again; needs follow-up imaging in 6 months  . Normocytic anemia   . TOBACCO USER 06/28/2006   Qualifier: Diagnosis of  By: Riccardo Dubin MD, Sherren Mocha    . Type II diabetes mellitus ([place]) 1994   diagnosed around 1994   Past Surgical History:  Procedure Laterality Date  .  AMPUTATION Left 07/08/2018   Procedure: AMPUTATION FORTH RAY LEFT FOOT;  Surgeon: [name], MD;  Location: [facility];  Service: Orthopedics;  Laterality: Left;  . LOWER EXTREMITY ANGIOGRAPHY N/A 07/05/2018   Procedure: LOWER EXTREMITY ANGIOGRAPHY;  Surgeon: Serafina Mitchell, MD;  Location: [facility];  Service: Cardiovascular;  Laterality: N/A;  . PERIPHERAL VASCULAR BALLOON ANGIOPLASTY Left 07/05/2018   Procedure: PERIPHERAL VASCULAR BALLOON ANGIOPLASTY;  Surgeon: Serafina Mitchell, MD;  Location: [facility];  Service: Cardiovascular;  Laterality: Left;  SFA  . TUBAL LIGATION  2002   Social History   Socioeconomic History  . Marital status: Single    Spouse name: Not on file  . Number of children: Not on file  . Years of education: 2  . Highest education level: Not on file  Occupational History    Employer: UNEMPLOYED  Social Needs  . Financial resource strain: Not on file  . Food insecurity:    Worry: Not on file    Inability: Not on file  . Transportation needs:    Medical: Not on file    Non-medical: Not on file  Tobacco Use  . Smoking status: Former Smoker    Packs/day: 0.10    Years: 10.00    Pack years: 1.00    Types: Cigarettes    Start date: 11/17/2013    Last attempt to quit: 09/09/2014    Years since quitting: 3.9  . Smokeless tobacco: Never Used  Substance and Sexual Activity  . Alcohol use: Yes    Comment: Occasionally.  . Drug  use: Yes    Frequency: 4.0 times per week    Types: Marijuana  . Sexual activity: Not on file  Lifestyle  . Physical activity:    Days per week: Not on file    Minutes per session: Not on file  . Stress: Not on file  Relationships  . Social connections:    Talks on phone: Not on file    Gets together: Not on file    Attends religious service: Not on file    Active member of club or organization: Not on file    Attends meetings of clubs or organizations: Not on file    Relationship status: Not on file  Other  Topics Concern  . Not on file  Social History Narrative  . Not on file   Family History  Problem Relation Age of Onset  . Diabetes Mother   . Diabetes Brother   . Diabetes Daughter   . Diabetes Daughter   . Mental retardation Brother        died from PNA  . Diabetes Maternal Grandmother    - negative except otherwise stated in the family history section No Known Allergies Prior to Admission medications   Medication Sig Start Date End Date Taking? Authorizing Provider  amLODipine (NORVASC) 10 MG tablet Take 1 tablet (10 mg total) by mouth daily. 12/24/17 10/07/18 Yes Rice, Resa Miner, MD  aspirin 81 MG EC tablet Take 1 tablet (81 mg total) by mouth daily. 07/12/18  Yes Mosetta Anis, MD  Dolutegravir-Rilpivirine (JULUCA) 50-25 MG TABS Take 1 tablet by mouth daily. Please take with largest meal of the day. 07/05/18  Yes Annia Belt, MD  doxycycline (VIBRA-TABS) 100 MG tablet Take 1 tablet (100 mg total) by mouth 2 (two) times daily. 08/01/18  Yes Rayburn, Neta Mends, PA-C  Insulin Glargine (LANTUS) 100 UNIT/ML Solostar Pen Inject 15 Units into the skin daily at 10 pm. Patient taking differently: Inject 15 Units into the skin at bedtime.  12/24/17  Yes Rice, Resa Miner, MD  labetalol (NORMODYNE) 100 MG tablet TAKE 1 TABLET BY MOUTH 2 TIMES DAILY Patient taking differently: Take 100 mg by mouth 2 (two) times daily.  07/13/18  Yes Chundi, Vahini, MD  liraglutide (VICTOZA) 18 MG/3ML SOPN Inject 0.2 mLs (1.2 mg total) into the skin daily. 02/04/18  Yes Chundi, Vahini, MD  Multiple Vitamin (MULTIVITAMIN WITH MINERALS) TABS tablet Take 1 tablet by mouth daily. 12/24/17  Yes Rice, Resa Miner, MD  oxyCODONE-acetaminophen (PERCOCET/ROXICET) 5-325 MG tablet Take 1 tablet by mouth every 6 (six) hours as needed for moderate pain or severe pain. 08/01/18  Yes Rayburn, Neta Mends, PA-C  silver sulfADIAZINE (SILVADENE) 1 % cream Apply 1 application topically daily. 08/01/18  Yes  Rayburn, Neta Mends, PA-C  sodium bicarbonate 650 MG tablet Take 1 tablet (650 mg total) by mouth 2 (two) times daily. 12/24/17  Yes Rice, Resa Miner, MD  triamcinolone cream (KENALOG) 0.1 % Apply 1 application topically 2 (two) times daily. Patient taking differently: Apply 1 application topically 2 (two) times daily as needed (itching).  02/04/18  Yes Chundi, Vahini, MD  citalopram (CELEXA) 20 MG tablet Take 1 tablet (20 mg total) by mouth daily. Patient not taking: Reported on 08/06/2018 12/24/17 12/24/18  Collier Salina, MD  diclofenac sodium (VOLTAREN) 1 % GEL Apply 2 g topically 4 (four) times daily. Patient not taking: Reported on 08/06/2018 12/24/17   Collier Salina, MD  glucose blood (ACCU-CHEK GUIDE) test strip USE TO  TEST BLOOD SUGAR 3 TIMES DAILY . Diagnosis code  E11.69, E66.9 07/09/17   Lars Mage, MD  pantoprazole (PROTONIX) 40 MG tablet Take 1 tablet (40 mg total) by mouth daily. Patient not taking: Reported on 08/06/2018 12/24/17   Collier Salina, MD  sennosides-docusate sodium (SENOKOT-S) 8.6-50 MG tablet Take 1 tablet by mouth daily as needed for constipation. Patient not taking: Reported on 08/06/2018 12/24/17   Collier Salina, MD   Dg Foot Complete Left  Result Date: 08/06/2018 CLINICAL DATA:  Draining wound at the site of previous amputation of distal fourth metatarsal and fourth toe. Fever. EXAM: LEFT FOOT - COMPLETE 3+ VIEW COMPARISON:  MRI dated 07/03/2018 FINDINGS: There is no radiographic evidence of osteomyelitis. Previous resection of the distal fourth metatarsal and fourth toe. There is a fairly sharp surgical margin in the fourth metatarsal. Small amount of gas in the soft tissues at the site of surgery. IMPRESSION: No radiographic evidence of osteomyelitis. Electronically Signed   By: Lorriane Shire M.D.   On: 08/06/2018 18:01   - pertinent xrays, CT, MRI studies were reviewed and independently interpreted  Positive ROS: All other systems  have been reviewed and were otherwise negative with the exception of those mentioned in the HPI and as above.  Physical Exam: General: Alert, no acute distress Psychiatric: Patient is competent for consent with normal mood and affect Lymphatic: No axillary or cervical lymphadenopathy Cardiovascular: No pedal edema Respiratory: No cyanosis, no use of accessory musculature GI: No organomegaly, abdomen is soft and non-tender    Images:  @ENCIMAGES @  Labs:  Lab Results  Component Value Date   HGBA1C 6.5 (H) 08/06/2018   HGBA1C 7.9 (A) 02/04/2018   HGBA1C 9.0 (H) 09/13/2017   ESRSEDRATE 21 08/06/2018   ESRSEDRATE 85 (H) 02/02/2017   CRP <0.8 08/06/2018   CRP 42.4 (H) 02/02/2017   REPTSTATUS 07/13/2018 FINAL 07/08/2018   GRAMSTAIN  07/08/2018    RARE WBC PRESENT, PREDOMINANTLY PMN RARE GRAM POSITIVE COCCI IN PAIRS    CULT  07/08/2018    FEW STAPHYLOCOCCUS AUREUS FEW GROUP B STREP(S.AGALACTIAE)ISOLATED TESTING AGAINST S. AGALACTIAE NOT ROUTINELY PERFORMED DUE TO PREDICTABILITY OF AMP/PEN/VAN SUSCEPTIBILITY. NO ANAEROBES ISOLATED Performed at [facility], [place] [address] [place],  [address]    [place] 07/08/2018    Lab Results  Component Value Date   ALBUMIN 2.6 (L) 08/07/2018   ALBUMIN 3.0 (L) 08/06/2018   ALBUMIN 2.2 (L) 07/09/2018    Neurologic: Patient does not have protective sensation bilateral lower extremities.   MUSCULOSKELETAL:   Skin: Examination of the proximal aspect of the surgical incision is healed well distally there is complete dehiscence with necrotic tissue and drainage from the fourth ray amputation.  Patient has a palpable dorsalis pedis pulse patient has severe protein caloric malnutrition with an albumin that is ranged from 2.2-3.0 currently 2.6.  Patient has a hemoglobin A1c of 6.5  Assessment: Assessment: Diabetic insensate neuropathy with peripheral vascular disease renal disease and severe protein  caloric malnutrition with dehiscence of the fourth ray amputation left foot  Plan: Plan: Discussed with the patient the optimal treatment would be to proceed with a transmetatarsal amputation discussed that this would still have difficulty healing and she would need to be strictly nonweightbearing for at least 3 weeks postoperatively to allow this to heal.  Patient was very tearful considering additional surgery she does not want to consider any type of treatment today.  Discussed that we can follow-up tomorrow Monday to  further discuss recommended surgical options.  Continue IV antibiotics.  Thank you for the consult and the opportunity to see Ms. Princess Perna, [place] [phone] 9:55 AM

## 2018-08-07 NOTE — Progress Notes (Signed)
   Subjective: Ms. Tess states that she continues to have left foot pain.  She says that the Percocet that she took made her very nauseous and did not help her pain.  She denies any other acute complaints.  Objective:  Vital signs in last 24 hours: Vitals:   08/06/18 2030 08/06/18 2100 08/06/18 2145 08/06/18 2252  BP: 136/77 140/80 (!) 161/94 139/82  Pulse: 83 86 87 90  Resp:  14  18  Temp:    98.6 F (37 C)  TempSrc:    Oral  SpO2: 100% 100% 100% 100%   Physical Exam Vitals signs and nursing note reviewed.  Musculoskeletal:     Comments: Left foot: Dehiscence at site of 4th digit ray amputation with associated edema. Tender to palpation of this area. No notable purulence or drainage currently.  Neurological:     Mental Status: She is alert.  Psychiatric:        Mood and Affect: Mood normal.        Behavior: Behavior normal.     Assessment/Plan:  Principal Problem:   Wound dehiscence Active Problems:   Diabetes mellitus type 2 in obese (HCC)   Human immunodeficiency virus (HIV) disease (HCC)   CKD (chronic kidney disease), stage V ([place])   Wound infection  Ms. Larrick has HIV, diabetes, PVD, and is admitted with signs of skin and soft tissue infection of surgical wound from recent left foot digit amputation. She was initially started on Zosyn and doxycycline. Her purulent infection however suggests a MRSA infection. I will switch her antibiotic treatment to vancomycin today to optimize coverage. Dr. Sharol Given with orthopedic surgery recommends transmetatarsal amputation. Ms. Ansley would like to think about her options and consider surgery tomorrow. We will continue antibiotic treatment at this time.  Left Foot Wound Soft Tissue infection: - Discontinued Zosyn and doxycycline - Start Vancomycin  - IV dilaudid 0.5 mg q4h PRN pain - NPO after midnight for potential surgery tomorrow.  CKD Stage V: Creatinine stable. - Continue to monitor - Continue Renal, carb modified, fluid  restricted; NPO at midnight  Type II Diabetes: Home regiment includes Victoza and Lantus. A1c 6.5. Started Lantus 7 units qhs while in hospital. Holding Victoza for now. Blood sugars today remain WNL.  - Continue Lantus 7 units qhs.   HIV: - Continue home Dolutegravir-rilpirivine  HTN: - Continue home amlodipine 10 mg QD - Continue home labetalol 100 mg BID  Left Breast Mass: - Will need to follow-up as an outpatient.  Diet: Renal, carb modified, fluid restricted VTE: Heparin Code: Full  Dispo: Anticipated discharge pending her decision on whether to pursue surgery or not.  Carroll Sage, MD 08/07/2018, 1:26 PM Pager: [phone]

## 2018-08-07 NOTE — Plan of Care (Signed)
  Problem: Clinical Measurements: Goal: Ability to avoid or minimize complications of infection will improve Outcome: Progressing   Problem: Skin Integrity: Goal: Skin integrity will improve Outcome: Progressing   Problem: Education: Goal: Knowledge of General Education information will improve Description: Including pain rating scale, medication(s)/side effects and non-pharmacologic comfort measures Outcome: Progressing   Problem: Health Behavior/Discharge Planning: Goal: Ability to manage health-related needs will improve Outcome: Progressing   Problem: Clinical Measurements: Goal: Ability to maintain clinical measurements within normal limits will improve Outcome: Progressing   Problem: Activity: Goal: Risk for activity intolerance will decrease Outcome: Progressing   Problem: Nutrition: Goal: Adequate nutrition will be maintained Outcome: Progressing   Problem: Coping: Goal: Level of anxiety will decrease Outcome: Progressing   Problem: Pain Managment: Goal: General experience of comfort will improve Outcome: Progressing   Problem: Safety: Goal: Ability to remain free from injury will improve Outcome: Progressing   Problem: Skin Integrity: Goal: Risk for impaired skin integrity will decrease Outcome: Progressing   

## 2018-08-08 ENCOUNTER — Ambulatory Visit (INDEPENDENT_AMBULATORY_CARE_PROVIDER_SITE_OTHER): Payer: Self-pay | Admitting: Physician Assistant

## 2018-08-08 LAB — CBC
HCT: 25.6 % — ABNORMAL LOW (ref 36.0–46.0)
Hemoglobin: 8.2 g/dL — ABNORMAL LOW (ref 12.0–15.0)
MCH: 29.1 pg (ref 26.0–34.0)
MCHC: 32 g/dL (ref 30.0–36.0)
MCV: 90.8 fL (ref 80.0–100.0)
Platelets: 250 10*3/uL (ref 150–400)
RBC: 2.82 MIL/uL — ABNORMAL LOW (ref 3.87–5.11)
RDW: 14.3 % (ref 11.5–15.5)
WBC: 6.6 10*3/uL (ref 4.0–10.5)
nRBC: 0 % (ref 0.0–0.2)

## 2018-08-08 LAB — BASIC METABOLIC PANEL
Anion gap: 10 (ref 5–15)
BUN: 37 mg/dL — ABNORMAL HIGH (ref 6–20)
CO2: 19 mmol/L — ABNORMAL LOW (ref 22–32)
Calcium: 8.4 mg/dL — ABNORMAL LOW (ref 8.9–10.3)
Chloride: 106 mmol/L (ref 98–111)
Creatinine, Ser: 5.56 mg/dL — ABNORMAL HIGH (ref 0.44–1.00)
GFR calc Af Amer: 10 mL/min — ABNORMAL LOW (ref >60)
GFR calc non Af Amer: 9 mL/min — ABNORMAL LOW (ref >60)
Glucose, Bld: 197 mg/dL — ABNORMAL HIGH (ref 70–99)
Potassium: 5 mmol/L (ref 3.5–5.1)
Sodium: 135 mmol/L (ref 135–145)

## 2018-08-08 LAB — GLUCOSE, CAPILLARY
Glucose-Capillary: 127 mg/dL — ABNORMAL HIGH (ref 70–99)
Glucose-Capillary: 80 mg/dL (ref 70–99)
Glucose-Capillary: 135 mg/dL — ABNORMAL HIGH (ref 70–99)
Glucose-Capillary: 134 mg/dL — ABNORMAL HIGH (ref 70–99)

## 2018-08-08 MED ORDER — VANCOMYCIN HCL IN DEXTROSE 750-5 MG/150ML-% IV SOLN
750.0000 mg | INTRAVENOUS | Status: DC
  Administered 2018-08-08: 750 mg via INTRAVENOUS
  Filled 2018-08-08: qty 150

## 2018-08-08 MED ORDER — CEFAZOLIN SODIUM-DEXTROSE 2-4 GM/100ML-% IV SOLN
2.0000 g | INTRAVENOUS | Status: AC
  Administered 2018-08-09: 2 g via INTRAVENOUS
  Filled 2018-08-08: qty 100

## 2018-08-08 MED ORDER — HYDROMORPHONE HCL 1 MG/ML IJ SOLN
1.0000 mg | INTRAMUSCULAR | Status: DC | PRN
  Administered 2018-08-08 – 2018-08-11 (×11): 1 mg via INTRAVENOUS
  Filled 2018-08-08 (×11): qty 1

## 2018-08-08 MED ORDER — POLYETHYLENE GLYCOL 3350 17 G PO PACK: 17.0000 g | PACK | Freq: Every day | ORAL | Status: DC | PRN

## 2018-08-08 MED ORDER — CHLORHEXIDINE GLUCONATE 4 % EX LIQD
60.0000 mL | Freq: Once | CUTANEOUS | Status: AC
  Administered 2018-08-09: 4 via TOPICAL
  Filled 2018-08-08: qty 60

## 2018-08-08 MED ORDER — SENNA 8.6 MG PO TABS
1.0000 | ORAL_TABLET | Freq: Every day | ORAL | Status: DC | PRN
  Administered 2018-08-08: 8.6 mg via ORAL
  Filled 2018-08-08: qty 1

## 2018-08-08 MED ORDER — INSULIN GLARGINE 100 UNIT/ML ~~LOC~~ SOLN
3.0000 [IU] | Freq: Every day | SUBCUTANEOUS | Status: DC
  Administered 2018-08-08: 3 [IU] via SUBCUTANEOUS
  Filled 2018-08-08 (×2): qty 0.03

## 2018-08-08 NOTE — Anesthesia Preprocedure Evaluation (Addendum)
Anesthesia Evaluation  Patient identified by MRN, date of birth, ID band Patient awake    Reviewed: Allergy & Precautions, NPO status , Patient's Chart, lab work & pertinent test results  History of Anesthesia Complications Negative for: history of anesthetic complications  Airway Mallampati: III  TM Distance: >3 FB Neck ROM: Full    Dental  (+) Missing, Poor Dentition, Partial Upper, Partial Lower   Pulmonary neg pulmonary ROS, former smoker,    Pulmonary exam normal        Cardiovascular hypertension, Normal cardiovascular exam     Neuro/Psych PSYCHIATRIC DISORDERS Depression negative neurological ROS     GI/Hepatic Neg liver ROS, GERD  ,gastroparesis   Endo/Other  diabetes, Poorly Controlled, Type 2, Insulin Dependent  Renal/GU CRFRenal disease  negative genitourinary   Musculoskeletal negative musculoskeletal ROS (+)   Abdominal   Peds  Hematology  (+) anemia , HIV,   Anesthesia Other Findings 42 yo F with left foot wound for TMA - HTN, CKD stage 4 (Cr 5.5), DM2, GERD/gastroparesis, HIV, anemia (Hgb 8.2)  Reproductive/Obstetrics                            Anesthesia Physical Anesthesia Plan  ASA: III  Anesthesia Plan: General   Post-op Pain Management:    Induction: Intravenous and Rapid sequence  PONV Risk Score and Plan: 3 and Ondansetron, Dexamethasone, Midazolam and Treatment may vary due to age or medical condition  Airway Management Planned: Oral ETT  Additional Equipment: None  Intra-op Plan:   Post-operative Plan: Extubation in OR  Informed Consent: I have reviewed the patients History and Physical, chart, labs and discussed the procedure including the risks, benefits and alternatives for the proposed anesthesia with the patient or authorized representative who has indicated his/her understanding and acceptance.   Dental advisory given  Plan Discussed with:    Anesthesia Plan Comments: (RSI for hx of gastroparesis. She has been nauseated this admission and vomited this morning.)       Anesthesia Quick Evaluation

## 2018-08-08 NOTE — Plan of Care (Signed)
  Problem: Clinical Measurements: Goal: Ability to avoid or minimize complications of infection will improve Outcome: Progressing   Problem: Skin Integrity: Goal: Skin integrity will improve Outcome: Progressing   Problem: Education: Goal: Knowledge of General Education information will improve Description Including pain rating scale, medication(s)/side effects and non-pharmacologic comfort measures Outcome: Progressing   Problem: Health Behavior/Discharge Planning: Goal: Ability to manage health-related needs will improve Outcome: Progressing   Problem: Clinical Measurements: Goal: Ability to maintain clinical measurements within normal limits will improve Outcome: Progressing Goal: Will remain free from infection Outcome: Progressing Goal: Diagnostic test results will improve Outcome: Progressing Goal: Respiratory complications will improve Outcome: Progressing Goal: Cardiovascular complication will be avoided Outcome: Progressing   Problem: Activity: Goal: Risk for activity intolerance will decrease Outcome: Progressing   Problem: Nutrition: Goal: Adequate nutrition will be maintained Outcome: Progressing   Problem: Coping: Goal: Level of anxiety will decrease Outcome: Progressing   Problem: Elimination: Goal: Will not experience complications related to bowel motility Outcome: Progressing Goal: Will not experience complications related to urinary retention Outcome: Progressing   Problem: Safety: Goal: Ability to remain free from injury will improve Outcome: Progressing   Problem: Skin Integrity: Goal: Risk for impaired skin integrity will decrease Outcome: Progressing

## 2018-08-08 NOTE — Progress Notes (Signed)
Patient ID: Kathy Frank, female   DOB: [birth year], 42 y.o.   MRN: [id number] Patient is seen in follow-up for wound dehiscence left foot status post fourth ray amputation.  Further discussions with patient regarding transmetatarsal amputation of the left foot.  Patient states she understands and wishes to proceed with surgery on Tuesday but I will set this up for a left transmetatarsal amputation on Tuesday.  N.p.o. after midnight tonight.

## 2018-08-08 NOTE — Progress Notes (Signed)
Pharmacy Antibiotic Note  Kathy Frank is a 42 y.o. female admitted on 08/06/2018 with wound infection.  Pharmacy has been consulted for Vancomycin dosing.  Abx for wound infection. Afebrile, WBC wnl. Ortho planning for L transmetatarsal amputation on 12/31  Plan: Decrease vancomycin to 750mg  IV q48hr Monitor clinical picture, renal function, vanc levels prn F/U amputation tomorrow, LOT  Consider adding Rocephin and Flagyl with this is being DFI  Temp (24hrs), Avg:98.2 F (36.8 C), Min:97.8 F (36.6 C), Max:98.6 F (37 C)  Recent Labs  Lab 08/06/18 1629 08/06/18 1827 08/07/18 0547 08/08/18 0244  WBC 7.3  --  6.9 6.6  CREATININE 5.37*  --  5.29* 5.56*  LATICACIDVEN  --  1.13  --   --     Estimated Creatinine Clearance: 13.1 mL/min (A) (by C-G formula based on SCr of 5.56 mg/dL (H)).    No Known Allergies  Thank you for allowing pharmacy to be a part of this patient's care.  Elenor Quinones, PharmD, BCPS, [facility] Clinical Pharmacist Phone number [phone] 08/08/2018 10:35 AM

## 2018-08-08 NOTE — Plan of Care (Signed)
  Problem: Safety: Goal: Ability to remain free from injury will improve Outcome: Progressing   

## 2018-08-08 NOTE — Progress Notes (Signed)
   Subjective: Kathy Frank states that she continues to have left foot pain which is only mildly relieved with her current pain medications. She is otherwise feels sad this morning but has made a decision to proceed with surgery tomorrow. She does not have any further questions or concerns.  Objective:  Vital signs in last 24 hours: Vitals:   08/06/18 2252 08/07/18 1523 08/07/18 2152 08/08/18 0454  BP: 139/82 (!) 149/94 (!) 159/93 127/83  Pulse: 90 80 85 90  Resp: 18 15 14 20   Temp: 98.6 F (37 C) 97.8 F (36.6 C) 98.2 F (36.8 C) 98.6 F (37 C)  TempSrc: Oral Oral Oral Oral  SpO2: 100% 99% 100% 98%   Physical Exam Vitals signs and nursing note reviewed.  Constitutional:      Appearance: Normal appearance.  Musculoskeletal:     Comments: Left foot covered by bandages. No LE edema noted bilaterally.  Neurological:     Mental Status: She is alert.  Psychiatric:     Comments: Behavior normal. She does have a depressed mood this morning.     Assessment/Plan:  Principal Problem:   Wound dehiscence Active Problems:   Diabetes mellitus type 2 in obese (HCC)   Human immunodeficiency virus (HIV) disease ([place])   CKD (chronic kidney disease), stage V ([place])   Wound infection  Kathy Frank is admitted with a left foot skin and soft tissue infection at the surgical site of a recent digit amputation. She is being treated with vancomycin and will undergo a transmetatarsal amputation tomorrow.   Left Foot Wound Soft Tissue Infection: - Continue vancomycin.  - Increased dose of IV Dilaudid to 1 mg every 4 hours as needed pain - N.p.o. after midnight for transmetatarsal amputation scheduled tomorrow.  CKD stage IV: Creatinine stable - Continue to monitor  Type 2 diabetes: Blood sugars remained within normal limits while on Lantus 7 units nightly and sliding scale insulin. - Continue Lantus 7 units nightly - Continue sliding scale insulin  HIV: - Continue home  Dolutegravir-rilpirivine  Left breast mass: -We will need follow-up as an outpatient  Diet: Renal, carb modified, fluid restricted; n.p.o. at midnight VTE PPX: Heparin CODE STATUS: Full  Dispo: Anticipated discharge pending outcome of surgery tomorrow.  Carroll Sage, MD 08/08/2018, 10:03 AM Pager: [phone]

## 2018-08-09 ENCOUNTER — Encounter (HOSPITAL_COMMUNITY)
Admission: EM | Disposition: A | Discharge status: Home / Self Care | Payer: Self-pay | Source: Home / Self Care | Attending: Internal Medicine

## 2018-08-09 ENCOUNTER — Ambulatory Visit (INDEPENDENT_AMBULATORY_CARE_PROVIDER_SITE_OTHER): Payer: Medicaid Other | Admitting: Physician Assistant

## 2018-08-09 ENCOUNTER — Encounter (HOSPITAL_COMMUNITY): Payer: Self-pay | Admitting: Certified Registered"

## 2018-08-09 ENCOUNTER — Inpatient Hospital Stay (HOSPITAL_COMMUNITY): Payer: Medicaid Other | Admitting: Anesthesiology

## 2018-08-09 DIAGNOSIS — T8130XA Disruption of wound, unspecified, initial encounter: Secondary | ICD-10-CM

## 2018-08-09 HISTORY — PX: AMPUTATION: SHX166

## 2018-08-09 LAB — GLUCOSE, CAPILLARY
Glucose-Capillary: 133 mg/dL — ABNORMAL HIGH (ref 70–99)
Glucose-Capillary: 174 mg/dL — ABNORMAL HIGH (ref 70–99)
Glucose-Capillary: 165 mg/dL — ABNORMAL HIGH (ref 70–99)
Glucose-Capillary: 129 mg/dL — ABNORMAL HIGH (ref 70–99)
Glucose-Capillary: 124 mg/dL — ABNORMAL HIGH (ref 70–99)

## 2018-08-09 LAB — CBC WITH DIFFERENTIAL/PLATELET
Abs Immature Granulocytes: 0.01 10*3/uL (ref 0.00–0.07)
Basophils Absolute: 0 10*3/uL (ref 0.0–0.1)
Basophils Relative: 1 %
Eosinophils Absolute: 0.8 10*3/uL — ABNORMAL HIGH (ref 0.0–0.5)
Eosinophils Relative: 13 %
HCT: 25.9 % — ABNORMAL LOW (ref 36.0–46.0)
Hemoglobin: 8.2 g/dL — ABNORMAL LOW (ref 12.0–15.0)
Immature Granulocytes: 0 %
Lymphocytes Relative: 29 %
Lymphs Abs: 1.7 10*3/uL (ref 0.7–4.0)
MCH: 28.6 pg (ref 26.0–34.0)
MCHC: 31.7 g/dL (ref 30.0–36.0)
MCV: 90.2 fL (ref 80.0–100.0)
Monocytes Absolute: 0.5 10*3/uL (ref 0.1–1.0)
Monocytes Relative: 9 %
Neutro Abs: 3 10*3/uL (ref 1.7–7.7)
Neutrophils Relative %: 48 %
Platelets: 253 10*3/uL (ref 150–400)
RBC: 2.87 MIL/uL — ABNORMAL LOW (ref 3.87–5.11)
RDW: 14.3 % (ref 11.5–15.5)
WBC: 6.1 10*3/uL (ref 4.0–10.5)
nRBC: 0 % (ref 0.0–0.2)

## 2018-08-09 LAB — BASIC METABOLIC PANEL
Anion gap: 12 (ref 5–15)
BUN: 39 mg/dL — ABNORMAL HIGH (ref 6–20)
CO2: 23 mmol/L (ref 22–32)
Calcium: 8.4 mg/dL — ABNORMAL LOW (ref 8.9–10.3)
Chloride: 102 mmol/L (ref 98–111)
Creatinine, Ser: 6.13 mg/dL — ABNORMAL HIGH (ref 0.44–1.00)
GFR calc Af Amer: 9 mL/min — ABNORMAL LOW (ref >60)
GFR calc non Af Amer: 8 mL/min — ABNORMAL LOW (ref >60)
Glucose, Bld: 174 mg/dL — ABNORMAL HIGH (ref 70–99)
Potassium: 4.7 mmol/L (ref 3.5–5.1)
Sodium: 137 mmol/L (ref 135–145)

## 2018-08-09 SURGERY — AMPUTATION, FOOT, PARTIAL
Anesthesia: General | Laterality: Left

## 2018-08-09 MED ORDER — PROPOFOL 10 MG/ML IV BOLUS
INTRAVENOUS | Status: DC | PRN
  Administered 2018-08-09: 140 mg via INTRAVENOUS

## 2018-08-09 MED ORDER — METHOCARBAMOL 1000 MG/10ML IJ SOLN
500.0000 mg | Freq: Four times a day (QID) | INTRAVENOUS | Status: DC | PRN
  Filled 2018-08-09: qty 5

## 2018-08-09 MED ORDER — ACETAMINOPHEN 325 MG PO TABS: 325.0000 mg | ORAL_TABLET | Freq: Four times a day (QID) | ORAL | Status: DC | PRN

## 2018-08-09 MED ORDER — SENNA 8.6 MG PO TABS: 2.0000 | ORAL_TABLET | Freq: Every day | ORAL | Status: DC

## 2018-08-09 MED ORDER — MAGNESIUM CITRATE PO SOLN: 1.0000 | Freq: Once | ORAL | Status: DC | PRN

## 2018-08-09 MED ORDER — OXYCODONE HCL 5 MG/5ML PO SOLN: 5.0000 mg | Freq: Once | ORAL | Status: DC | PRN

## 2018-08-09 MED ORDER — BISACODYL 10 MG RE SUPP: 10.0000 mg | Freq: Every day | RECTAL | Status: DC | PRN

## 2018-08-09 MED ORDER — OXYCODONE HCL 5 MG PO TABS: 5.0000 mg | ORAL_TABLET | Freq: Once | ORAL | Status: DC | PRN

## 2018-08-09 MED ORDER — LIDOCAINE 2% (20 MG/ML) 5 ML SYRINGE
INTRAMUSCULAR | Status: DC | PRN
  Administered 2018-08-09: 100 mg via INTRAVENOUS

## 2018-08-09 MED ORDER — FENTANYL CITRATE (PF) 100 MCG/2ML IJ SOLN
INTRAMUSCULAR | Status: DC | PRN
  Administered 2018-08-09: 100 ug via INTRAVENOUS

## 2018-08-09 MED ORDER — METOCLOPRAMIDE HCL 5 MG/ML IJ SOLN
5.0000 mg | Freq: Three times a day (TID) | INTRAMUSCULAR | Status: DC | PRN
  Administered 2018-08-09: 10 mg via INTRAVENOUS
  Filled 2018-08-09: qty 2

## 2018-08-09 MED ORDER — METOCLOPRAMIDE HCL 5 MG PO TABS: 5.0000 mg | ORAL_TABLET | Freq: Three times a day (TID) | ORAL | Status: DC | PRN

## 2018-08-09 MED ORDER — FENTANYL CITRATE (PF) 250 MCG/5ML IJ SOLN
INTRAMUSCULAR | Status: AC
  Filled 2018-08-09: qty 5

## 2018-08-09 MED ORDER — HYDROMORPHONE HCL 1 MG/ML IJ SOLN
INTRAMUSCULAR | Status: AC
  Filled 2018-08-09: qty 1

## 2018-08-09 MED ORDER — MIDAZOLAM HCL 5 MG/5ML IJ SOLN
INTRAMUSCULAR | Status: DC | PRN
  Administered 2018-08-09: 2 mg via INTRAVENOUS

## 2018-08-09 MED ORDER — OXYCODONE HCL 5 MG PO TABS
10.0000 mg | ORAL_TABLET | ORAL | Status: DC | PRN
  Administered 2018-08-09 – 2018-08-10 (×4): 15 mg via ORAL
  Filled 2018-08-09 (×4): qty 3

## 2018-08-09 MED ORDER — PROPOFOL 10 MG/ML IV BOLUS
INTRAVENOUS | Status: AC
  Filled 2018-08-09: qty 20

## 2018-08-09 MED ORDER — 0.9 % SODIUM CHLORIDE (POUR BTL) OPTIME
TOPICAL | Status: DC | PRN
  Administered 2018-08-09: 1000 mL

## 2018-08-09 MED ORDER — ONDANSETRON HCL 4 MG/2ML IJ SOLN
INTRAMUSCULAR | Status: DC | PRN
  Administered 2018-08-09: 4 mg via INTRAVENOUS

## 2018-08-09 MED ORDER — SUCCINYLCHOLINE CHLORIDE 200 MG/10ML IV SOSY
PREFILLED_SYRINGE | INTRAVENOUS | Status: DC | PRN
  Administered 2018-08-09: 100 mg via INTRAVENOUS

## 2018-08-09 MED ORDER — SENNA 8.6 MG PO TABS: 1.0000 | ORAL_TABLET | Freq: Every day | ORAL | Status: DC | PRN

## 2018-08-09 MED ORDER — SODIUM CHLORIDE 0.9 % IV SOLN
INTRAVENOUS | Status: DC
  Administered 2018-08-09: 14:00:00 via INTRAVENOUS

## 2018-08-09 MED ORDER — ONDANSETRON HCL 4 MG PO TABS: 4.0000 mg | ORAL_TABLET | Freq: Four times a day (QID) | ORAL | Status: DC | PRN

## 2018-08-09 MED ORDER — ONDANSETRON HCL 4 MG/2ML IJ SOLN
INTRAMUSCULAR | Status: AC
  Filled 2018-08-09: qty 2

## 2018-08-09 MED ORDER — INSULIN GLARGINE 100 UNIT/ML ~~LOC~~ SOLN
7.0000 [IU] | Freq: Every day | SUBCUTANEOUS | Status: DC
  Administered 2018-08-09 – 2018-08-11 (×3): 7 [IU] via SUBCUTANEOUS
  Filled 2018-08-09 (×4): qty 0.07

## 2018-08-09 MED ORDER — DOCUSATE SODIUM 100 MG PO CAPS
100.0000 mg | ORAL_CAPSULE | Freq: Two times a day (BID) | ORAL | Status: DC
  Administered 2018-08-09 (×2): 100 mg via ORAL
  Filled 2018-08-09 (×2): qty 1

## 2018-08-09 MED ORDER — ONDANSETRON HCL 4 MG/2ML IJ SOLN: 4.0000 mg | Freq: Once | INTRAMUSCULAR | Status: DC | PRN

## 2018-08-09 MED ORDER — HYDROMORPHONE HCL 1 MG/ML IJ SOLN
0.2500 mg | INTRAMUSCULAR | Status: DC | PRN
  Administered 2018-08-09 (×4): 0.5 mg via INTRAVENOUS

## 2018-08-09 MED ORDER — METHOCARBAMOL 500 MG PO TABS
500.0000 mg | ORAL_TABLET | Freq: Four times a day (QID) | ORAL | Status: DC | PRN
  Administered 2018-08-09 – 2018-08-10 (×3): 500 mg via ORAL
  Filled 2018-08-09 (×3): qty 1

## 2018-08-09 MED ORDER — OXYCODONE HCL 5 MG PO TABS: 5.0000 mg | ORAL_TABLET | ORAL | Status: DC | PRN

## 2018-08-09 MED ORDER — MIDAZOLAM HCL 2 MG/2ML IJ SOLN
INTRAMUSCULAR | Status: AC
  Filled 2018-08-09: qty 2

## 2018-08-09 MED ORDER — PHENYLEPHRINE 40 MCG/ML (10ML) SYRINGE FOR IV PUSH (FOR BLOOD PRESSURE SUPPORT)
PREFILLED_SYRINGE | INTRAVENOUS | Status: DC | PRN
  Administered 2018-08-09 (×2): 80 ug via INTRAVENOUS

## 2018-08-09 MED ORDER — ONDANSETRON HCL 4 MG/2ML IJ SOLN
4.0000 mg | Freq: Four times a day (QID) | INTRAMUSCULAR | Status: DC | PRN
  Administered 2018-08-09 (×2): 4 mg via INTRAVENOUS
  Filled 2018-08-09 (×3): qty 2

## 2018-08-09 MED ORDER — LIDOCAINE 2% (20 MG/ML) 5 ML SYRINGE
INTRAMUSCULAR | Status: AC
  Filled 2018-08-09: qty 5

## 2018-08-09 MED ORDER — PHENYLEPHRINE 40 MCG/ML (10ML) SYRINGE FOR IV PUSH (FOR BLOOD PRESSURE SUPPORT)
PREFILLED_SYRINGE | INTRAVENOUS | Status: AC
  Filled 2018-08-09: qty 10

## 2018-08-09 MED ORDER — SODIUM CHLORIDE 0.9 % IV SOLN
INTRAVENOUS | Status: DC
  Administered 2018-08-09: 11:00:00 via INTRAVENOUS

## 2018-08-09 MED ORDER — HYDROMORPHONE HCL 1 MG/ML IJ SOLN: 0.5000 mg | INTRAMUSCULAR | Status: DC | PRN

## 2018-08-09 MED ORDER — SUCCINYLCHOLINE CHLORIDE 200 MG/10ML IV SOSY
PREFILLED_SYRINGE | INTRAVENOUS | Status: AC
  Filled 2018-08-09: qty 10

## 2018-08-09 MED ORDER — POLYETHYLENE GLYCOL 3350 17 G PO PACK: 17.0000 g | PACK | Freq: Every day | ORAL | Status: DC | PRN

## 2018-08-09 SURGICAL SUPPLY — 36 items
APL SKNCLS STERI-STRIP NONHPOA (GAUZE/BANDAGES/DRESSINGS)
BENZOIN TINCTURE PRP APPL 2/3 (GAUZE/BANDAGES/DRESSINGS) ×1 IMPLANT
BLADE SAW SGTL HD 18.5X60.5X1. (BLADE) ×2 IMPLANT
BLADE SURG 21 STRL SS (BLADE) ×2 IMPLANT
BNDG COHESIVE 4X5 TAN STRL (GAUZE/BANDAGES/DRESSINGS) IMPLANT
BNDG GAUZE ELAST 4 BULKY (GAUZE/BANDAGES/DRESSINGS) IMPLANT
COVER SURGICAL LIGHT HANDLE (MISCELLANEOUS) ×2 IMPLANT
COVER WAND RF STERILE (DRAPES) ×1 IMPLANT
DRAPE INCISE IOBAN 66X45 STRL (DRAPES) ×2 IMPLANT
DRAPE U-SHAPE 47X51 STRL (DRAPES) ×2 IMPLANT
DRESSING PREVENA PLUS CUSTOM (GAUZE/BANDAGES/DRESSINGS) IMPLANT
DRSG ADAPTIC 3X8 NADH LF (GAUZE/BANDAGES/DRESSINGS) IMPLANT
DRSG PAD ABDOMINAL 8X10 ST (GAUZE/BANDAGES/DRESSINGS) IMPLANT
DRSG PREVENA PLUS CUSTOM (GAUZE/BANDAGES/DRESSINGS) ×2
DURAPREP 26ML APPLICATOR (WOUND CARE) ×2 IMPLANT
ELECT REM PT RETURN 9FT ADLT (ELECTROSURGICAL) ×2
ELECTRODE REM PT RTRN 9FT ADLT (ELECTROSURGICAL) ×1 IMPLANT
GAUZE SPONGE 4X4 12PLY STRL (GAUZE/BANDAGES/DRESSINGS) IMPLANT
GLOVE BIOGEL PI IND STRL 9 (GLOVE) ×1 IMPLANT
GLOVE BIOGEL PI INDICATOR 9 (GLOVE) ×1
GLOVE SURG ORTHO 9.0 STRL STRW (GLOVE) ×2 IMPLANT
GOWN STRL REUS W/ TWL XL LVL3 (GOWN DISPOSABLE) ×3 IMPLANT
GOWN STRL REUS W/TWL XL LVL3 (GOWN DISPOSABLE) ×2
KIT BASIN OR (CUSTOM PROCEDURE TRAY) ×2 IMPLANT
KIT TURNOVER KIT B (KITS) ×2 IMPLANT
NS IRRIG 1000ML POUR BTL (IV SOLUTION) ×2 IMPLANT
PACK ORTHO EXTREMITY (CUSTOM PROCEDURE TRAY) ×2 IMPLANT
PAD ARMBOARD 7.5X6 YLW CONV (MISCELLANEOUS) ×4 IMPLANT
SPONGE LAP 18X18 X RAY DECT (DISPOSABLE) IMPLANT
SUT ETHILON 2 0 PSLX (SUTURE) ×4 IMPLANT
SUT VIC AB 2-0 CTB1 (SUTURE) IMPLANT
TOWEL OR 17X24 6PK STRL BLUE (TOWEL DISPOSABLE) ×2 IMPLANT
TOWEL OR 17X26 10 PK STRL BLUE (TOWEL DISPOSABLE) ×2 IMPLANT
TUBE CONNECTING 12X1/4 (SUCTIONS) ×2 IMPLANT
WATER STERILE IRR 1000ML POUR (IV SOLUTION) ×1 IMPLANT
YANKAUER SUCT BULB TIP NO VENT (SUCTIONS) ×2 IMPLANT

## 2018-08-09 NOTE — H&P (Signed)
Kathy Frank is an 42 y.o. female.   Chief Complaint: Dehiscence left foot fourth ray amputation. 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  Past Medical History:  Diagnosis Date  . CKD (chronic kidney disease) stage 3, GFR 30-59 ml/min (HCC) 06/19/2016   06/30/16: Seen by Dr. Posey Pronto [Nephrology]. Suspect etiologies to include diabetes and hypertension though cannot exclude HIV-associated nephropathy. Advised her against from using excessive Goody powder.  . CKD (chronic kidney disease), stage III ([place])   . Contact dermatitis 04/21/2016   07/29/16: Skin biopsy performed by [name] notable for acute spongiotic dermatitis consistent with contact dermatitis.  . Cyclical vomiting    THC induced???  . Depression   . Depression 06/28/2006   Qualifier: Diagnosis of  By: Kathy Dubin MD, Kathy Frank    . Diabetes mellitus type 2 in obese ([place]) 06/28/1994  . DKA (diabetic ketoacidoses) ([place]) 12/2014  . Erosive esophagitis   . Esophageal reflux   . Essential hypertension 11/16/2013  . Gastroparesis    ? diabetic  . GERD (gastroesophageal reflux disease)   . Human immunodeficiency virus (HIV) disease ([place]) dx'd 04/2016  . Human immunodeficiency virus (HIV) disease ([place]) 04/23/2016  . Hypertension   . Moderate nonproliferative diabetic retinopathy of both eyes ([place]) 11/21/2014   11/14/14: Noted on retinal imaging; needs follow-up imaging in 6 months  05/22/16: Noted on retinal imaging again; needs follow-up imaging in 6 months  . Normocytic anemia   . TOBACCO USER 06/28/2006   Qualifier: Diagnosis of  By: Kathy Dubin MD, Kathy Frank    . Type II diabetes mellitus ([place]) 1994   diagnosed around 1994    Past Surgical History:  Procedure Laterality Date  . AMPUTATION Left 07/08/2018   Procedure: AMPUTATION FORTH RAY LEFT FOOT;  Surgeon: [name], MD;  Location: [facility];  Service: Orthopedics;  Laterality:  Left;  . LOWER EXTREMITY ANGIOGRAPHY N/A 07/05/2018   Procedure: LOWER EXTREMITY ANGIOGRAPHY;  Surgeon: Serafina Mitchell, MD;  Location: [facility];  Service: Cardiovascular;  Laterality: N/A;  . PERIPHERAL VASCULAR BALLOON ANGIOPLASTY Left 07/05/2018   Procedure: PERIPHERAL VASCULAR BALLOON ANGIOPLASTY;  Surgeon: Serafina Mitchell, MD;  Location: [facility];  Service: Cardiovascular;  Laterality: Left;  SFA  . TUBAL LIGATION  2002    Family History  Problem Relation Age of Onset  . Diabetes Mother   . Diabetes Brother   . Diabetes Daughter   . Diabetes Daughter   . Mental retardation Brother        died from PNA  . Diabetes Maternal Grandmother    Social History:  reports that she quit smoking about 3 years ago. Her smoking use included cigarettes. She started smoking about 4 years ago. She has a 1.00 pack-year smoking history. She has never used smokeless tobacco. She reports current alcohol use. She reports current drug use. Frequency: 4.00 times per week. Drug: Marijuana.  Allergies: No Known Allergies  Medications Prior to Admission  Medication Sig Dispense Refill  . amLODipine (NORVASC) 10 MG tablet Take 1 tablet (10 mg total) by mouth daily. 30 tablet 2  . aspirin 81 MG EC tablet Take 1 tablet (81 mg total) by mouth daily. 90 tablet 3  . Dolutegravir-Rilpivirine (JULUCA) 50-25 MG TABS Take 1 tablet by mouth daily. Please take with largest meal of the day. 30 tablet 2  . doxycycline (VIBRA-TABS) 100 MG tablet Take 1 tablet (100 mg  total) by mouth 2 (two) times daily. 14 tablet 0  . Insulin Glargine (LANTUS) 100 UNIT/ML Solostar Pen Inject 15 Units into the skin daily at 10 pm. (Kathy Frank taking differently: Inject 15 Units into the skin at bedtime. ) 15 mL   . labetalol (NORMODYNE) 100 MG tablet TAKE 1 TABLET BY MOUTH 2 TIMES DAILY (Kathy Frank taking differently: Take 100 mg by mouth 2 (two) times daily. ) 180 tablet 0  . liraglutide (VICTOZA) 18 MG/3ML SOPN Inject 0.2  mLs (1.2 mg total) into the skin daily. 3 mL 1  . Multiple Vitamin (MULTIVITAMIN WITH MINERALS) TABS tablet Take 1 tablet by mouth daily. 90 tablet 0  . oxyCODONE-acetaminophen (PERCOCET/ROXICET) 5-325 MG tablet Take 1 tablet by mouth every 6 (six) hours as needed for moderate pain or severe pain. 28 tablet 0  . silver sulfADIAZINE (SILVADENE) 1 % cream Apply 1 application topically daily. 50 g 0  . sodium bicarbonate 650 MG tablet Take 1 tablet (650 mg total) by mouth 2 (two) times daily. 60 tablet 6  . triamcinolone cream (KENALOG) 0.1 % Apply 1 application topically 2 (two) times daily. (Kathy Frank taking differently: Apply 1 application topically 2 (two) times daily as needed (itching). ) 30 g 0  . citalopram (CELEXA) 20 MG tablet Take 1 tablet (20 mg total) by mouth daily. (Kathy Frank not taking: Reported on 08/06/2018) 30 tablet 2  . diclofenac sodium (VOLTAREN) 1 % GEL Apply 2 g topically 4 (four) times daily. (Kathy Frank not taking: Reported on 08/06/2018) 100 g 0  . glucose blood (ACCU-CHEK GUIDE) test strip USE TO TEST BLOOD SUGAR 3 TIMES DAILY . Diagnosis code  E11.69, E66.9 100 each 11  . pantoprazole (PROTONIX) 40 MG tablet Take 1 tablet (40 mg total) by mouth daily. (Kathy Frank not taking: Reported on 08/06/2018) 30 tablet 1  . sennosides-docusate sodium (SENOKOT-S) 8.6-50 MG tablet Take 1 tablet by mouth daily as needed for constipation. (Kathy Frank not taking: Reported on 08/06/2018) 60 tablet 0    Results for orders placed or performed during the hospital encounter of 08/06/18 (from the past 48 hour(s))  Glucose, capillary     Status: Abnormal   Collection Time: 08/07/18 11:55 AM  Result Value Ref Range   Glucose-Capillary 158 (H) 70 - 99 mg/dL  Glucose, capillary     Status: Abnormal   Collection Time: 08/07/18  4:54 PM  Result Value Ref Range   Glucose-Capillary 139 (H) 70 - 99 mg/dL  Glucose, capillary     Status: Abnormal   Collection Time: 08/07/18  9:55 PM  Result Value Ref Range    Glucose-Capillary 159 (H) 70 - 99 mg/dL  CBC     Status: Abnormal   Collection Time: 08/08/18  2:44 AM  Result Value Ref Range   WBC 6.6 4.0 - 10.5 K/uL   RBC 2.82 (L) 3.87 - 5.11 MIL/uL   Hemoglobin 8.2 (L) 12.0 - 15.0 g/dL   HCT 25.6 (L) 36.0 - 46.0 %   MCV 90.8 80.0 - 100.0 fL   MCH 29.1 26.0 - 34.0 pg   MCHC 32.0 30.0 - 36.0 g/dL   RDW 14.3 11.5 - 15.5 %   Platelets 250 150 - 400 K/uL   nRBC 0.0 0.0 - 0.2 %    Comment: Performed at [facility], [place] [address], [place], [place] [address]  Basic metabolic panel     Status: Abnormal   Collection Time: 08/08/18  2:44 AM  Result Value Ref Range   Sodium 135  135 - 145 mmol/L   Potassium 5.0 3.5 - 5.1 mmol/L   Chloride 106 98 - 111 mmol/L   CO2 19 (L) 22 - 32 mmol/L   Glucose, Bld 197 (H) 70 - 99 mg/dL   BUN 37 (H) 6 - 20 mg/dL   Creatinine, Ser 5.56 (H) 0.44 - 1.00 mg/dL   Calcium 8.4 (L) 8.9 - 10.3 mg/dL   GFR calc non Af Amer 9 (L) >60 mL/min   GFR calc Af Amer 10 (L) >60 mL/min   Anion gap 10 5 - 15    Comment: Performed at [facility] [address], [place], [place] [address]  Glucose, capillary     Status: Abnormal   Collection Time: 08/08/18  6:38 AM  Result Value Ref Range   Glucose-Capillary 127 (H) 70 - 99 mg/dL  Glucose, capillary     Status: None   Collection Time: 08/08/18 12:11 PM  Result Value Ref Range   Glucose-Capillary 80 70 - 99 mg/dL  Glucose, capillary     Status: Abnormal   Collection Time: 08/08/18  3:46 PM  Result Value Ref Range   Glucose-Capillary 135 (H) 70 - 99 mg/dL  Glucose, capillary     Status: Abnormal   Collection Time: 08/08/18  9:15 PM  Result Value Ref Range   Glucose-Capillary 134 (H) 70 - 99 mg/dL  Basic metabolic panel     Status: Abnormal   Collection Time: 08/09/18  3:48 AM  Result Value Ref Range   Sodium 137 135 - 145 mmol/L   Potassium 4.7 3.5 - 5.1 mmol/L   Chloride 102 98 - 111 mmol/L   CO2 23 22 - 32 mmol/L   Glucose, Bld 174 (H) 70 - 99 mg/dL   BUN 39  (H) 6 - 20 mg/dL   Creatinine, Ser 6.13 (H) 0.44 - 1.00 mg/dL   Calcium 8.4 (L) 8.9 - 10.3 mg/dL   GFR calc non Af Amer 8 (L) >60 mL/min   GFR calc Af Amer 9 (L) >60 mL/min   Anion gap 12 5 - 15    Comment: Performed at [facility], [place] [address] [place],  [address]  CBC with Differential/Platelet     Status: Abnormal   Collection Time: 08/09/18  3:48 AM  Result Value Ref Range   WBC 6.1 4.0 - 10.5 K/uL   RBC 2.87 (L) 3.87 - 5.11 MIL/uL   Hemoglobin 8.2 (L) 12.0 - 15.0 g/dL   HCT 25.9 (L) 36.0 - 46.0 %   MCV 90.2 80.0 - 100.0 fL   MCH 28.6 26.0 - 34.0 pg   MCHC 31.7 30.0 - 36.0 g/dL   RDW 14.3 11.5 - 15.5 %   Platelets 253 150 - 400 K/uL   nRBC 0.0 0.0 - 0.2 %   Neutrophils Relative % 48 %   Neutro Abs 3.0 1.7 - 7.7 K/uL   Lymphocytes Relative 29 %   Lymphs Abs 1.7 0.7 - 4.0 K/uL   Monocytes Relative 9 %   Monocytes Absolute 0.5 0.1 - 1.0 K/uL   Eosinophils Relative 13 %   Eosinophils Absolute 0.8 (H) 0.0 - 0.5 K/uL   Basophils Relative 1 %   Basophils Absolute 0.0 0.0 - 0.1 K/uL   Immature Granulocytes 0 %   Abs Immature Granulocytes 0.01 0.00 - 0.07 K/uL    Comment: Performed at [facility], [place] [address], [place], [place] [address]  Glucose, capillary     Status: Abnormal  Collection Time: 08/09/18  6:14 AM  Result Value Ref Range   Glucose-Capillary 133 (H) 70 - 99 mg/dL  Glucose, capillary     Status: Abnormal   Collection Time: 08/09/18 10:03 AM  Result Value Ref Range   Glucose-Capillary 174 (H) 70 - 99 mg/dL   No results found.  ROS  Blood pressure (!) 142/90, pulse 85, temperature 98.7 F (37.1 C), temperature source Oral, resp. rate 18, SpO2 100 %. Physical Exam  Examination Kathy Frank has palpable pulses the proximal half of the wound is healed the distal half has had dehiscence there is exposed bone.  There is no ascending cellulitis. Assessment/Plan Assessment: Diabetic insensate neuropathy dehiscence left foot fourth ray  amputation. Plan: Kathy Frank would like to continue with foot salvage intervention we will proceed with a transmetatarsal amputation.  Risk and benefits were discussed including risk of the wound not healing need for higher level amputation.  Kathy Frank states she understands wished to proceed at this time.  [name], MD 08/09/2018, 11:11 AM

## 2018-08-09 NOTE — Plan of Care (Signed)
  Problem: Clinical Measurements: Goal: Ability to avoid or minimize complications of infection will improve Outcome: Progressing   Problem: Skin Integrity: Goal: Skin integrity will improve Outcome: Progressing   Problem: Education: Goal: Knowledge of General Education information will improve Description Including pain rating scale, medication(s)/side effects and non-pharmacologic comfort measures Outcome: Progressing   Problem: Health Behavior/Discharge Planning: Goal: Ability to manage health-related needs will improve Outcome: Progressing   Problem: Clinical Measurements: Goal: Ability to maintain clinical measurements within normal limits will improve Outcome: Progressing Goal: Will remain free from infection Outcome: Progressing Goal: Diagnostic test results will improve Outcome: Progressing Goal: Respiratory complications will improve Outcome: Progressing Goal: Cardiovascular complication will be avoided Outcome: Progressing   Problem: Activity: Goal: Risk for activity intolerance will decrease Outcome: Progressing   Problem: Nutrition: Goal: Adequate nutrition will be maintained Outcome: Progressing   Problem: Coping: Goal: Level of anxiety will decrease Outcome: Progressing   Problem: Elimination: Goal: Will not experience complications related to bowel motility Outcome: Progressing Goal: Will not experience complications related to urinary retention Outcome: Progressing   Problem: Safety: Goal: Ability to remain free from injury will improve Outcome: Progressing   Problem: Skin Integrity: Goal: Risk for impaired skin integrity will decrease Outcome: Progressing

## 2018-08-09 NOTE — Anesthesia Procedure Notes (Signed)
Procedure Name: Intubation Date/Time: 08/09/2018 11:31 AM Performed by: Trinna Post., CRNA Pre-anesthesia Checklist: Patient identified, Emergency Drugs available, Suction available, Patient being monitored and Timeout performed Patient Re-evaluated:Patient Re-evaluated prior to induction Oxygen Delivery Method: Circle system utilized Preoxygenation: Pre-oxygenation with 100% oxygen Induction Type: IV induction Laryngoscope Size: Mac and 3 Grade View: Grade II Tube type: Oral Tube size: 7.0 mm Number of attempts: 1 Airway Equipment and Method: Stylet Placement Confirmation: ETT inserted through vocal cords under direct vision,  positive ETCO2 and breath sounds checked- equal and bilateral Secured at: 22 cm Tube secured with: Tape Dental Injury: Teeth and Oropharynx as per pre-operative assessment

## 2018-08-09 NOTE — Progress Notes (Signed)
RN verified the presence of a signed informed consent that matches stated procedure by patient. Verified armband matches patient's stated name and birth date. Verified NPO status and that all jewelry, contact, glasses, dentures, and partials had been removed (if applicable).  

## 2018-08-09 NOTE — Op Note (Signed)
08/09/2018  11:58 AM  PATIENT:  Kathy Frank    PRE-OPERATIVE DIAGNOSIS:  Dehiscence Left Foot fourth ray amputation  POST-OPERATIVE DIAGNOSIS:  Same  PROCEDURE:  Left Transmetatarsal Amputation. Application of Praveena wound VAC 13 cm  SURGEON:  [name], MD  PHYSICIAN ASSISTANT:None ANESTHESIA:   General  PREOPERATIVE INDICATIONS:  Kathy Frank is a  42 y.o. female with a diagnosis of Dehiscence Left Foot who failed conservative measures and elected for surgical management.    The risks benefits and alternatives were discussed with the patient preoperatively including but not limited to the risks of infection, bleeding, nerve injury, cardiopulmonary complications, the need for revision surgery, among others, and the patient was willing to proceed.  OPERATIVE IMPLANTS: Praveena wound VAC  @ENCIMAGES @  OPERATIVE FINDINGS: Good petechial bleeding no abscess  OPERATIVE PROCEDURE: Patient was brought the operating room underwent a general anesthetic.  After adequate levels anesthesia were obtained patient's left lower extremity was prepped using DuraPrep draped into the sterile field a timeout was called.  A fishmouth incision was made just proximal to the dehisced wound.  This was carried down to healthy viable tissue.  A oscillating saw was used to perform a transmetatarsal amputation beveled plantarly.  Electrocautery was used for hemostasis the wound was irrigated with normal saline the incision was closed using 2-0 nylon.  A Praveena wound VAC was applied this had a good suction fit patient was extubated taken to PACU in stable condition.   DISCHARGE PLANNING:  Antibiotic duration: Continue antibiotics for 24 hours  Weightbearing: Nonweightbearing on the left  Pain medication: Opioid pathway ordered  Dressing care/ Wound VAC: Continue wound VAC for 1 week  Ambulatory devices: Walker  Discharge to: Home  Follow-up: In the office 1 week post  operative.

## 2018-08-09 NOTE — Transfer of Care (Signed)
Immediate Anesthesia Transfer of Care Note  Patient: Kathy Frank  Procedure(s) Performed: Left Transmetatarsal Amputation (Left )  Patient Location: PACU  Anesthesia Type:General  Level of Consciousness: drowsy  Airway & Oxygen Therapy: Patient Spontanous Breathing and Patient connected to nasal cannula oxygen  Post-op Assessment: Report given to RN and Post -op Vital signs reviewed and stable  Post vital signs: Reviewed and stable  Last Vitals:  Vitals Value Taken Time  BP 128/83 08/09/2018 12:06 PM  Temp    Pulse 79 08/09/2018 12:06 PM  Resp 11 08/09/2018 12:06 PM  SpO2 100 % 08/09/2018 12:06 PM  Vitals shown include unvalidated device data.  Last Pain:  Vitals:   08/09/18 0710  TempSrc:   PainSc: 0-No pain      Patients Stated Pain Goal: 2 (41/59/73 3125)  Complications: No apparent anesthesia complications

## 2018-08-09 NOTE — Evaluation (Signed)
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 s/p Left Transmetatarsal Amputation.08/09/18  Clinical Impression  PTA pt was living with daughters in home with 3 steps to enter, independent in ambulation and self care. Pt currently limited in safe mobility by increased L LE pain and decreased balance and endurance while maintaining NWB on LLE. Pt currently requires supervision for bed mobility and min guard for transfers and ambulation of 3 feet with RW. PT recommending SNF level rehab at discharge to work on balance and safe mobility in her home environment. PT will continue to follow acutely.    Follow Up Recommendations Home health PT;Supervision/Assistance - 24 hour    Equipment Recommendations  3in1 (PT)    Recommendations for Other Services       Precautions / Restrictions Precautions Precautions: Fall Restrictions Weight Bearing Restrictions: Yes LLE Weight Bearing: Non weight bearing      Mobility  Bed Mobility Overal bed mobility: Needs Assistance Bed Mobility: Supine to Sit     Supine to sit: Supervision;HOB elevated     General bed mobility comments: supervision for safety, increased time and use of bed rails  Transfers Overall transfer level: Needs assistance Equipment used: Rolling walker (2 wheeled) Transfers: Sit to/from Stand Sit to Stand: Min guard         General transfer comment: min guard for tranfers to RW, vc for hand placement and maintaining NWB on L LE  Ambulation/Gait Ambulation/Gait assistance: Min guard Gait Distance (Feet): 3 Feet Assistive device: Rolling walker (2 wheeled) Gait Pattern/deviations: Antalgic(hop to ) Gait velocity: slowed Gait velocity interpretation: <1.31 ft/sec, indicative of  household ambulator General Gait Details: min guard for safety, vc for making sure that she feels recliner on back of leg before starting to sit        Balance Overall balance assessment: Needs assistance Sitting-balance support: Feet supported;No upper extremity supported Sitting balance-Leahy Scale: Good     Standing balance support: Single extremity supported;During functional activity Standing balance-Leahy Scale: Poor Standing balance comment: requires at least single UE assist to maintain balance with NWB on L LE                             Pertinent Vitals/Pain Pain Assessment: 0-10 Pain Score: 9  Pain Location: L foot Pain Descriptors / Indicators: Grimacing;Throbbing;Aching Pain Intervention(s): Limited activity within patient's tolerance;Monitored during session;Repositioned;Premedicated before session    [place] expects to be discharged to:: Private residence Living Arrangements: Children Available Help at Discharge: Family;Available 24 hours/day Type of Home: House Home Access: Stairs to enter Entrance Stairs-Rails: Can reach both Entrance Stairs-Number of Steps: 3 Home Layout: One level Home Equipment: Walker - 2 wheels      Prior Function Level of Independence: Independent                  Extremity/Trunk Assessment   Upper Extremity Assessment Upper Extremity Assessment: Overall WFL for tasks assessed    Lower Extremity Assessment Lower Extremity Assessment: LLE deficits/detail LLE Deficits / Details: s/p L transmetatarsal amputation        Communication   Communication: No difficulties  Cognition Arousal/Alertness: Awake/alert Behavior During Therapy: WFL for tasks assessed/performed;Flat affect Overall Cognitive Status: Within Functional Limits for tasks assessed  General Comments General comments (skin integrity, edema, etc.): Wound vac in place and  working     Assessment/Plan    PT Assessment Patient needs continued PT services  PT Problem List Decreased activity tolerance;Decreased balance;Decreased mobility;Decreased safety awareness;Pain       PT Treatment Interventions DME instruction;Gait training;Stair training;Functional mobility training;Therapeutic activities;Therapeutic exercise;Balance training;Patient/family education    PT Goals (Current goals can be found in the Care Plan section)  Acute Rehab PT Goals Patient Stated Goal: have less pain PT Goal Formulation: With patient Time For Goal Achievement: 08/23/18 Potential to Achieve Goals: Fair    Frequency Min 3X/week    AM-PAC PT "6 Clicks" Mobility  Outcome Measure Help needed turning from your back to your side while in a flat bed without using bedrails?: None Help needed moving from lying on your back to sitting on the side of a flat bed without using bedrails?: None Help needed moving to and from a bed to a chair (including a wheelchair)?: A Little Help needed standing up from a chair using your arms (e.g., wheelchair or bedside chair)?: A Little Help needed to walk in hospital room?: A Lot Help needed climbing 3-5 steps with a railing? : Total 6 Click Score: 17    End of Session Equipment Utilized During Treatment: Gait belt Activity Tolerance: Patient limited by pain Patient left: in chair;with call bell/phone within reach;with chair alarm set Nurse Communication: Mobility status PT Visit Diagnosis: Unsteadiness on feet (R26.81);Other abnormalities of gait and mobility (R26.89);Muscle weakness (generalized) (M62.81);Difficulty in walking, not elsewhere classified (R26.2);Pain Pain - Right/Left: Left Pain - part of body: Ankle and joints of foot    Time: 1555-1615 PT Time Calculation (min) (ACUTE ONLY): 20 min   Charges:   PT Evaluation $PT Eval Low Complexity: 1 Low          Yvonnia Tango B. Migdalia Dk PT, DPT Acute Rehabilitation  Services Pager [phone] Office [phone]   [place] 08/09/2018, 4:42 PM

## 2018-08-09 NOTE — Anesthesia Postprocedure Evaluation (Signed)
Anesthesia Post Note  Patient: Kathy Frank  Procedure(s) Performed: Left Transmetatarsal Amputation (Left )     Patient location during evaluation: PACU Anesthesia Type: General Level of consciousness: awake and alert Pain management: pain level controlled Vital Signs Assessment: post-procedure vital signs reviewed and stable Respiratory status: spontaneous breathing, nonlabored ventilation and respiratory function stable Cardiovascular status: blood pressure returned to baseline and stable Postop Assessment: no apparent nausea or vomiting Anesthetic complications: no    Last Vitals:  Vitals:   08/09/18 1306 08/09/18 1449  BP: 131/84 (!) 145/84  Pulse: 78 76  Resp: 15 18  Temp: 36.6 C 36.6 C  SpO2: 100% 100%    Last Pain:  Vitals:   08/09/18 1520  TempSrc:   PainSc: 9                  Mialynn Shelvin E Saburo Luger

## 2018-08-09 NOTE — Progress Notes (Signed)
   Subjective: Patient is feeling ok this morning. She endorses nausea and vomiting beginning yesterday afternoon. Complains of continued pain in her left foot. She has not had a bowel movement in a couple of days. Discussed plan for surgery today. All questions and concerns were addressed.   Objective:  Vital signs in last 24 hours: Vitals:   08/08/18 0454 08/08/18 1426 08/08/18 1953 08/09/18 0308  BP: 127/83 (!) 150/89 (!) 166/102 (!) 142/90  Pulse: 90 81 90 85  Resp: 20 18 18 18   Temp: 98.6 F (37 C) 98.2 F (36.8 C) 98.3 F (36.8 C) 98.7 F (37.1 C)  TempSrc: Oral Oral Oral Oral  SpO2: 98% 100% 98% 100%   Physical exam: General: awake, alert, lying in bed in NAD Abd: abdomen is soft, non-tender Ext: left foot bandaged. No lower extremity edema   Assessment/Plan:  Principal Problem:   Wound dehiscence Active Problems:   Diabetes mellitus type 2 in obese (HCC)   Human immunodeficiency virus (HIV) disease ([place])   CKD (chronic kidney disease), stage V ([place])   Wound infection  Ms. Wanamaker is admitted with a left foot skin and soft tissue infection at the surgical site of a recent digit amputation. She is being treated with vancomycin and will undergo a transmetatarsal amputation today.   Left Foot Wound Soft Tissue Infection: - Continue vancomycin.  - Continue IV Dilaudid 1 mg every 4 hours as needed pain - Follow-up operative course  CKD stage IV: Creatinine stable - Continue to monitor  Type 2 diabetes: Lantus decreased to 3 units last night in anticipation of surgery this morning.  Her blood sugars have remained within normal limits.  We will increase her Lantus back to 7 units nightly with sliding scale insulin.  - Increase Lantus dose back to 7 units nightly  - Continue sliding scale insulin  HIV: - Continue home Dolutegravir-rilpirivine  Constipation: Likely due to opioid use - Schedule Colace 100 mg BID - Start bisacodyl suppository 10 mg QD PRN -  Continue Senokot 1 tab QD PRN - Continue Miralax PRN  Left breast mass: - She will need follow-up as an outpatient  Diet: NPO prior to surgery; Renal, carb modified, fluid restricted after VTE PPX: Heparin CODE STATUS: Full  Dispo: Anticipated discharge in 3-5 days.  Carroll Sage, MD 08/09/2018, 8:44 AM Pager: [phone]

## 2018-08-10 ENCOUNTER — Encounter (HOSPITAL_COMMUNITY): Payer: Self-pay | Admitting: Orthopedic Surgery

## 2018-08-10 LAB — BASIC METABOLIC PANEL
Anion gap: 10 (ref 5–15)
Anion gap: 11 (ref 5–15)
BUN: 40 mg/dL — ABNORMAL HIGH (ref 6–20)
BUN: 48 mg/dL — ABNORMAL HIGH (ref 6–20)
CO2: 24 mmol/L (ref 22–32)
CO2: 24 mmol/L (ref 22–32)
Calcium: 8.5 mg/dL — ABNORMAL LOW (ref 8.9–10.3)
Calcium: 8.9 mg/dL (ref 8.9–10.3)
Chloride: 104 mmol/L (ref 98–111)
Chloride: 102 mmol/L (ref 98–111)
Creatinine, Ser: 6.28 mg/dL — ABNORMAL HIGH (ref 0.44–1.00)
Creatinine, Ser: 6.61 mg/dL — ABNORMAL HIGH (ref 0.44–1.00)
GFR calc Af Amer: 9 mL/min — ABNORMAL LOW (ref >60)
GFR calc Af Amer: 8 mL/min — ABNORMAL LOW (ref >60)
GFR calc non Af Amer: 8 mL/min — ABNORMAL LOW (ref >60)
GFR calc non Af Amer: 7 mL/min — ABNORMAL LOW (ref >60)
Glucose, Bld: 104 mg/dL — ABNORMAL HIGH (ref 70–99)
Glucose, Bld: 76 mg/dL (ref 70–99)
Potassium: 5 mmol/L (ref 3.5–5.1)
Potassium: 4.9 mmol/L (ref 3.5–5.1)
Sodium: 138 mmol/L (ref 135–145)
Sodium: 137 mmol/L (ref 135–145)

## 2018-08-10 LAB — CBC WITH DIFFERENTIAL/PLATELET
Abs Immature Granulocytes: 0.01 10*3/uL (ref 0.00–0.07)
Basophils Absolute: 0 10*3/uL (ref 0.0–0.1)
Basophils Relative: 0 %
Eosinophils Absolute: 0.7 10*3/uL — ABNORMAL HIGH (ref 0.0–0.5)
Eosinophils Relative: 10 %
HCT: 23.5 % — ABNORMAL LOW (ref 36.0–46.0)
Hemoglobin: 7.6 g/dL — ABNORMAL LOW (ref 12.0–15.0)
Immature Granulocytes: 0 %
Lymphocytes Relative: 22 %
Lymphs Abs: 1.5 10*3/uL (ref 0.7–4.0)
MCH: 29.7 pg (ref 26.0–34.0)
MCHC: 32.3 g/dL (ref 30.0–36.0)
MCV: 91.8 fL (ref 80.0–100.0)
Monocytes Absolute: 0.6 10*3/uL (ref 0.1–1.0)
Monocytes Relative: 9 %
Neutro Abs: 4.1 10*3/uL (ref 1.7–7.7)
Neutrophils Relative %: 59 %
Platelets: 257 10*3/uL (ref 150–400)
RBC: 2.56 MIL/uL — ABNORMAL LOW (ref 3.87–5.11)
RDW: 14.4 % (ref 11.5–15.5)
WBC: 6.9 10*3/uL (ref 4.0–10.5)
nRBC: 0 % (ref 0.0–0.2)

## 2018-08-10 LAB — GLUCOSE, CAPILLARY
Glucose-Capillary: 99 mg/dL (ref 70–99)
Glucose-Capillary: 126 mg/dL — ABNORMAL HIGH (ref 70–99)
Glucose-Capillary: 71 mg/dL (ref 70–99)
Glucose-Capillary: 125 mg/dL — ABNORMAL HIGH (ref 70–99)

## 2018-08-10 MED ORDER — SENNA 8.6 MG PO TABS
2.0000 | ORAL_TABLET | Freq: Two times a day (BID) | ORAL | Status: DC
  Administered 2018-08-10 – 2018-08-12 (×5): 17.2 mg via ORAL
  Filled 2018-08-10 (×5): qty 2

## 2018-08-10 MED ORDER — OXYCODONE HCL 5 MG PO TABS
5.0000 mg | ORAL_TABLET | ORAL | Status: DC | PRN
  Administered 2018-08-10 – 2018-08-12 (×11): 10 mg via ORAL
  Filled 2018-08-10 (×12): qty 2

## 2018-08-10 MED ORDER — POLYETHYLENE GLYCOL 3350 17 G PO PACK
17.0000 g | PACK | Freq: Every day | ORAL | Status: DC
  Administered 2018-08-10 – 2018-08-12 (×3): 17 g via ORAL
  Filled 2018-08-10 (×3): qty 1

## 2018-08-10 MED ORDER — METHOCARBAMOL 500 MG PO TABS
1000.0000 mg | ORAL_TABLET | Freq: Four times a day (QID) | ORAL | Status: DC | PRN
  Administered 2018-08-11 (×2): 1000 mg via ORAL
  Filled 2018-08-10 (×2): qty 2

## 2018-08-10 MED ORDER — CITALOPRAM HYDROBROMIDE 20 MG PO TABS
20.0000 mg | ORAL_TABLET | Freq: Every day | ORAL | Status: DC
  Administered 2018-08-10 – 2018-08-12 (×3): 20 mg via ORAL
  Filled 2018-08-10 (×3): qty 1

## 2018-08-10 NOTE — Progress Notes (Signed)
Patient ID: Kathy Frank, female   DOB: 07-17-1976, 43 y.o.   MRN: [id number] Postoperative day 1 transmetatarsal amputation left foot.  Wound edges were healthy and viable no signs of abscess no indication for prolonged antibiotics.  Discussed the importance of nonweightbearing on the left foot for wound healing.  The wound VAC was not plugged in and the battery was dead the unit was not working.  This was plugged in and the wound VAC is functioning well.  Patient will need to discharge with the portable VAC.

## 2018-08-10 NOTE — Progress Notes (Signed)
   Subjective: Kathy Frank states that she has been in severe pain since her surgery yesterday.  She states that the IV Dilaudid does help but it is only lasting a very short time.  She does not believe the oral oxycodone is helping.  She does think that the methocarbamol helps the most.  She has not had a bowel movement in over several days.  She denies abdominal pain.  She says that she is depressed and would like to start an antidepressant today.  She is aware that it may take up to 4 to 6 weeks to have full effect.  Objective:  Vital signs in last 24 hours: Vitals:   08/09/18 1449 08/09/18 2001 08/09/18 2135 08/10/18 0453  BP: (!) 145/84 140/80  (!) 155/87  Pulse: 76 83  87  Resp: 18 18  18   Temp: 97.8 F (36.6 C) 98.3 F (36.8 C)  98.5 F (36.9 C)  TempSrc: Oral Oral  Oral  SpO2: 100% 100%  100%  Weight:   76.1 kg   Height:   5\' 5"  (1.651 m)    Physical Exam Vitals signs and nursing note reviewed.  Constitutional:      Appearance: Normal appearance.  Musculoskeletal:     Comments: Left surgical wound from transmetatarsal amputation clean dry and intact.  Wound VAC in place.  Initially was not plugged in and off but now works once being plugged in.  Neurological:     Mental Status: She is alert.  Psychiatric:     Comments: Very teary throughout our conversation.  Depressed mood.     Assessment/Plan:  Principal Problem:   Wound dehiscence s/p L transmetatarsal amputation Active Problems:   Diabetes mellitus type 2 in obese (HCC)   Human immunodeficiency virus (HIV) disease ([place])   CKD (chronic kidney disease), stage V ([place])   Wound infection s/p L transmetatarsal amputation  Kathy Frank was admitted for a left foot skin and soft tissue infection and is now POD1 from a left transmetatarsal amputation. She continues to have a significant amount of pain. Will change her pain medications today (please see below) and continue antibiotics today (plan to discontinue tomorrow).  She was seen by PT who recommended home health PT.  Left Foot Wound Soft Tissue Infection s/p transmetatarsal amputation: - Continue vancomycin today. Discontinue tomorrow - Oxycodone 5-10 mg q4h PRN pain - IV Dilaudid 1 mg every 4 hours as needed breakthrough pain - Methocarbamol 1000 mg q6h PRN muscle spasms  CKD stage IV: Creatinine continues to increase (today: 6.28). Will repeat BMP this afternoon. Plan on discontinuing vancomycin tomorrow. - Continue to monitor renal function  Type 2 diabetes: Blood sugars well controlled on Lantus 7 units qhs and SSI - Continue Lantus 7 units qhs - Continue sliding scale insulin  HIV: - Continue homeDolutegravir-rilpirivine  Constipation: Likely due to opioid use - Schedule Miralax daily - Schedule Senokot 2 tab QD  - Continue bisacodyl suppository 10 mg QD PRN  Depression: She tolerated celexa in the past. - Restart Celexa 20 mg QD  Left breast mass: - She will need follow-up as an outpatient  Diet:Renal, carb modified, fluid restricted VTEPPX:Heparin CODE STATUS: Full  Dispo: Anticipated discharge in 1-3 days pending pain control.  Carroll Sage, MD 08/10/2018, 11:15 AM Pager: [phone]

## 2018-08-11 ENCOUNTER — Other Ambulatory Visit: Payer: Self-pay

## 2018-08-11 DIAGNOSIS — E875 Hyperkalemia: Secondary | ICD-10-CM

## 2018-08-11 DIAGNOSIS — I739 Peripheral vascular disease, unspecified: Secondary | ICD-10-CM

## 2018-08-11 DIAGNOSIS — L089 Local infection of the skin and subcutaneous tissue, unspecified: Secondary | ICD-10-CM

## 2018-08-11 DIAGNOSIS — D649 Anemia, unspecified: Secondary | ICD-10-CM

## 2018-08-11 DIAGNOSIS — F339 Major depressive disorder, recurrent, unspecified: Secondary | ICD-10-CM

## 2018-08-11 DIAGNOSIS — E119 Type 2 diabetes mellitus without complications: Secondary | ICD-10-CM

## 2018-08-11 LAB — BASIC METABOLIC PANEL
Anion gap: 8 (ref 5–15)
Anion gap: 9 (ref 5–15)
BUN: 41 mg/dL — ABNORMAL HIGH (ref 6–20)
BUN: 44 mg/dL — ABNORMAL HIGH (ref 6–20)
CO2: 26 mmol/L (ref 22–32)
CO2: 25 mmol/L (ref 22–32)
Calcium: 8.5 mg/dL — ABNORMAL LOW (ref 8.9–10.3)
Calcium: 8.7 mg/dL — ABNORMAL LOW (ref 8.9–10.3)
Chloride: 102 mmol/L (ref 98–111)
Chloride: 102 mmol/L (ref 98–111)
Creatinine, Ser: 6.71 mg/dL — ABNORMAL HIGH (ref 0.44–1.00)
Creatinine, Ser: 6.69 mg/dL — ABNORMAL HIGH (ref 0.44–1.00)
GFR calc Af Amer: 8 mL/min — ABNORMAL LOW (ref >60)
GFR calc Af Amer: 8 mL/min — ABNORMAL LOW (ref >60)
GFR calc non Af Amer: 7 mL/min — ABNORMAL LOW (ref >60)
GFR calc non Af Amer: 7 mL/min — ABNORMAL LOW (ref >60)
Glucose, Bld: 129 mg/dL — ABNORMAL HIGH (ref 70–99)
Glucose, Bld: 137 mg/dL — ABNORMAL HIGH (ref 70–99)
Potassium: 5.5 mmol/L — ABNORMAL HIGH (ref 3.5–5.1)
Potassium: 5.6 mmol/L — ABNORMAL HIGH (ref 3.5–5.1)
Sodium: 136 mmol/L (ref 135–145)
Sodium: 136 mmol/L (ref 135–145)

## 2018-08-11 LAB — CBC
HCT: 23.1 % — ABNORMAL LOW (ref 36.0–46.0)
Hemoglobin: 7.3 g/dL — ABNORMAL LOW (ref 12.0–15.0)
MCH: 29.4 pg (ref 26.0–34.0)
MCHC: 31.6 g/dL (ref 30.0–36.0)
MCV: 93.1 fL (ref 80.0–100.0)
Platelets: 238 10*3/uL (ref 150–400)
RBC: 2.48 MIL/uL — ABNORMAL LOW (ref 3.87–5.11)
RDW: 14.1 % (ref 11.5–15.5)
WBC: 8 10*3/uL (ref 4.0–10.5)
nRBC: 0 % (ref 0.0–0.2)

## 2018-08-11 LAB — RENAL FUNCTION PANEL
Albumin: 2.7 g/dL — ABNORMAL LOW (ref 3.5–5.0)
Anion gap: 11 (ref 5–15)
BUN: 41 mg/dL — ABNORMAL HIGH (ref 6–20)
CO2: 24 mmol/L (ref 22–32)
Calcium: 8.6 mg/dL — ABNORMAL LOW (ref 8.9–10.3)
Chloride: 101 mmol/L (ref 98–111)
Creatinine, Ser: 6.91 mg/dL — ABNORMAL HIGH (ref 0.44–1.00)
GFR calc Af Amer: 8 mL/min — ABNORMAL LOW (ref >60)
GFR calc non Af Amer: 7 mL/min — ABNORMAL LOW (ref >60)
Glucose, Bld: 142 mg/dL — ABNORMAL HIGH (ref 70–99)
Phosphorus: 6.4 mg/dL — ABNORMAL HIGH (ref 2.5–4.6)
Potassium: 4.9 mmol/L (ref 3.5–5.1)
Sodium: 136 mmol/L (ref 135–145)

## 2018-08-11 LAB — CBC WITH DIFFERENTIAL/PLATELET
Abs Immature Granulocytes: 0.03 10*3/uL (ref 0.00–0.07)
Basophils Absolute: 0 10*3/uL (ref 0.0–0.1)
Basophils Relative: 0 %
Eosinophils Absolute: 0.9 10*3/uL — ABNORMAL HIGH (ref 0.0–0.5)
Eosinophils Relative: 12 %
HCT: 22 % — ABNORMAL LOW (ref 36.0–46.0)
Hemoglobin: 7 g/dL — ABNORMAL LOW (ref 12.0–15.0)
Immature Granulocytes: 0 %
Lymphocytes Relative: 18 %
Lymphs Abs: 1.3 10*3/uL (ref 0.7–4.0)
MCH: 29.5 pg (ref 26.0–34.0)
MCHC: 31.8 g/dL (ref 30.0–36.0)
MCV: 92.8 fL (ref 80.0–100.0)
Monocytes Absolute: 1 10*3/uL (ref 0.1–1.0)
Monocytes Relative: 14 %
Neutro Abs: 4.2 10*3/uL (ref 1.7–7.7)
Neutrophils Relative %: 56 %
Platelets: 244 10*3/uL (ref 150–400)
RBC: 2.37 MIL/uL — ABNORMAL LOW (ref 3.87–5.11)
RDW: 14.4 % (ref 11.5–15.5)
WBC: 7.4 10*3/uL (ref 4.0–10.5)
nRBC: 0 % (ref 0.0–0.2)

## 2018-08-11 LAB — PROTIME-INR
INR: 1.02
Prothrombin Time: 13.3 seconds (ref 11.4–15.2)

## 2018-08-11 LAB — GLUCOSE, CAPILLARY
Glucose-Capillary: 126 mg/dL — ABNORMAL HIGH (ref 70–99)
Glucose-Capillary: 150 mg/dL — ABNORMAL HIGH (ref 70–99)
Glucose-Capillary: 122 mg/dL — ABNORMAL HIGH (ref 70–99)
Glucose-Capillary: 151 mg/dL — ABNORMAL HIGH (ref 70–99)

## 2018-08-11 LAB — APTT: aPTT: 40 seconds — ABNORMAL HIGH (ref 24–36)

## 2018-08-11 MED ORDER — SODIUM CHLORIDE 0.9 % IV SOLN
250.0000 mg | Freq: Every day | INTRAVENOUS | Status: DC
  Administered 2018-08-11 – 2018-08-12 (×2): 250 mg via INTRAVENOUS
  Filled 2018-08-11 (×2): qty 20

## 2018-08-11 MED ORDER — SODIUM CHLORIDE 0.9 % IV BOLUS
500.0000 mL | Freq: Once | INTRAVENOUS | Status: AC
  Administered 2018-08-11: 500 mL via INTRAVENOUS

## 2018-08-11 MED ORDER — DARBEPOETIN ALFA 100 MCG/0.5ML IJ SOSY
100.0000 ug | PREFILLED_SYRINGE | Freq: Once | INTRAMUSCULAR | Status: AC
  Administered 2018-08-11: 100 ug via SUBCUTANEOUS
  Filled 2018-08-11: qty 0.5

## 2018-08-11 MED ORDER — SODIUM POLYSTYRENE SULFONATE 15 GM/60ML PO SUSP
15.0000 g | Freq: Once | ORAL | Status: AC
  Administered 2018-08-11: 15 g via ORAL
  Filled 2018-08-11: qty 60

## 2018-08-11 MED ORDER — FUROSEMIDE 10 MG/ML IJ SOLN
20.0000 mg | Freq: Once | INTRAMUSCULAR | Status: AC
  Administered 2018-08-11: 20 mg via INTRAVENOUS
  Filled 2018-08-11: qty 2

## 2018-08-11 NOTE — Consult Note (Signed)
[facility] Renal Consultation Note  Requesting MD: Dr. Alfonse Spruce Indication for Consultation: CKD 5, hyperkalemia  HPI: Kathy Frank is a 43 y.o. female with HIV, DM since teenage years, HTN who is currently admitted with diabetic foot wound s/p TMT amputation and wound vac placement for ray amputation wound dehiscence on 12/31.  Nephrology is consulted for evaluation and management of her CKD.  Longstanding CKD which has been progressive in setting of this foot wound.  She was seen by Dr. Joelyn Oms during 06/2018 admission.  Creatinine has been in the 5-6s since that time.  She saw Dr Posey Pronto in clinic 07/2018 and has f/u scheduled for 09/2018   During this admission her creatinine has remained in the 6s and she's developed some modest hyperkalemia today, 5.5-5.6.  She feels fine and denies uremic symptoms.  She is making arrangements for d/c with wound vac and home health and therapy.  No issues with edema or urination.    Creat  Date/Time Value Ref Range Status  05/27/2016 10:18 AM 1.68 (H) 0.50 - 1.10 mg/dL Final  11/14/2014 11:54 AM 0.92 0.50 - 1.10 mg/dL Final  11/24/2013 03:00 PM 1.03 0.50 - 1.10 mg/dL Final   Creatinine, Ser  Date/Time Value Ref Range Status  08/11/2018 03:42 AM 6.69 (H) 0.44 - 1.00 mg/dL Final  08/11/2018 01:19 AM 6.71 (H) 0.44 - 1.00 mg/dL Final  08/10/2018 04:14 PM 6.61 (H) 0.44 - 1.00 mg/dL Final  08/10/2018 03:46 AM 6.28 (H) 0.44 - 1.00 mg/dL Final  08/09/2018 03:48 AM 6.13 (H) 0.44 - 1.00 mg/dL Final  08/08/2018 02:44 AM 5.56 (H) 0.44 - 1.00 mg/dL Final  08/07/2018 05:47 AM 5.29 (H) 0.44 - 1.00 mg/dL Final  08/06/2018 04:29 PM 5.37 (H) 0.44 - 1.00 mg/dL Final  07/11/2018 05:10 AM 5.94 (H) 0.44 - 1.00 mg/dL Final  07/10/2018 03:04 AM 5.70 (H) 0.44 - 1.00 mg/dL Final  07/09/2018 02:52 AM 5.83 (H) 0.44 - 1.00 mg/dL Final  07/08/2018 03:53 AM 5.56 (H) 0.44 - 1.00 mg/dL Final  07/07/2018 03:09 AM 5.62 (H) 0.44 - 1.00 mg/dL Final  07/06/2018 03:35 AM  5.56 (H) 0.44 - 1.00 mg/dL Final  07/05/2018 05:07 AM 6.02 (H) 0.44 - 1.00 mg/dL Final  07/04/2018 06:08 AM 5.98 (H) 0.44 - 1.00 mg/dL Final  07/03/2018 03:05 PM 5.75 (H) 0.44 - 1.00 mg/dL Final  07/03/2018 08:43 AM 6.44 (H) 0.44 - 1.00 mg/dL Final  07/02/2018 11:30 PM 6.74 (H) 0.44 - 1.00 mg/dL Final  12/23/2017 03:58 PM 3.92 (H) 0.57 - 1.00 mg/dL Final  09/14/2017 04:06 AM 3.19 (H) 0.44 - 1.00 mg/dL Final  09/13/2017 06:07 AM 3.89 (H) 0.44 - 1.00 mg/dL Final  09/13/2017 03:53 AM 3.96 (H) 0.44 - 1.00 mg/dL Final  09/12/2017 03:10 PM 3.87 (H) 0.44 - 1.00 mg/dL Final  03/14/2017 10:48 PM 2.52 (H) 0.44 - 1.00 mg/dL Final  03/14/2017 05:33 PM 2.71 (H) 0.44 - 1.00 mg/dL Final  02/02/2017 10:47 AM 2.71 (H) 0.57 - 1.00 mg/dL Final  12/02/2016 11:23 AM 2.54 (H) 0.57 - 1.00 mg/dL Final  11/23/2016 04:36 AM 2.91 (H) 0.44 - 1.00 mg/dL Final  11/22/2016 04:06 PM 3.31 (H) 0.44 - 1.00 mg/dL Final  11/20/2016 07:16 PM 2.73 (H) 0.44 - 1.00 mg/dL Final  11/16/2016 04:59 AM 2.43 (H) 0.44 - 1.00 mg/dL Final  11/15/2016 03:20 AM 2.43 (H) 0.44 - 1.00 mg/dL Final  11/14/2016 05:32 PM 2.89 (H) 0.44 - 1.00 mg/dL Final  10/06/2016 11:22 AM 2.05 (H) 0.57 - 1.00 mg/dL  Final  10/01/2016 05:50 AM 2.45 (H) 0.44 - 1.00 mg/dL Final  09/30/2016 08:50 AM 3.15 (H) 0.44 - 1.00 mg/dL Final  05/29/2016 02:38 PM 1.99 (H) 0.44 - 1.00 mg/dL Final  04/23/2016 03:35 PM 1.66 (H) 0.57 - 1.00 mg/dL Final  01/06/2016 08:23 AM 1.70 (H) 0.44 - 1.00 mg/dL Final  01/04/2016 08:27 PM 2.26 (H) 0.44 - 1.00 mg/dL Final  11/20/2015 11:20 AM 2.05 (H) 0.44 - 1.00 mg/dL Final  09/27/2015 10:47 AM 1.27 (H) 0.44 - 1.00 mg/dL Final  05/30/2015 08:56 AM 1.83 (H) 0.44 - 1.00 mg/dL Final  12/25/2014 04:15 AM 1.09 (H) 0.44 - 1.00 mg/dL Final  12/24/2014 09:59 AM 1.08 (H) 0.44 - 1.00 mg/dL Final  12/16/2014 05:35 AM 1.04 (H) 0.44 - 1.00 mg/dL Final  12/15/2014 04:48 AM 1.16 (H) 0.44 - 1.00 mg/dL Final  12/15/2014 12:45 AM 1.23 (H) 0.44 - 1.00  mg/dL Final  12/14/2014 09:01 PM 1.24 (H) 0.44 - 1.00 mg/dL Final  12/14/2014 04:54 PM 1.23 (H) 0.44 - 1.00 mg/dL Final  12/14/2014 01:15 PM 1.40 (H) 0.44 - 1.00 mg/dL Final     PMHx:   Past Medical History:  Diagnosis Date  . CKD (chronic kidney disease) stage 3, GFR 30-59 ml/min (HCC) 06/19/2016   06/30/16: Seen by Dr. Posey Pronto [Nephrology]. Suspect etiologies to include diabetes and hypertension though cannot exclude HIV-associated nephropathy. Advised her against from using excessive Goody powder.  . CKD (chronic kidney disease), stage III ([place])   . Contact dermatitis 04/21/2016   07/29/16: Skin biopsy performed by [facility] notable for acute spongiotic dermatitis consistent with contact dermatitis.  . Cyclical vomiting    THC induced???  . Depression   . Depression 06/28/2006   Qualifier: Diagnosis of  By: Riccardo Dubin MD, Sherren Mocha    . Diabetes mellitus type 2 in obese ([place]) 06/28/1994  . DKA (diabetic ketoacidoses) ([place]) 12/2014  . Erosive esophagitis   . Esophageal reflux   . Essential hypertension 11/16/2013  . Gastroparesis    ? diabetic  . GERD (gastroesophageal reflux disease)   . Human immunodeficiency virus (HIV) disease ([place]) dx'd 04/2016  . Human immunodeficiency virus (HIV) disease ([place]) 04/23/2016  . Hypertension   . Moderate nonproliferative diabetic retinopathy of both eyes ([place]) 11/21/2014   11/14/14: Noted on retinal imaging; needs follow-up imaging in 6 months  05/22/16: Noted on retinal imaging again; needs follow-up imaging in 6 months  . Normocytic anemia   . TOBACCO USER 06/28/2006   Qualifier: Diagnosis of  By: Riccardo Dubin MD, Sherren Mocha    . Type II diabetes mellitus ([place]) 1994   diagnosed around 1994    Past Surgical History:  Procedure Laterality Date  . AMPUTATION Left 07/08/2018   Procedure: AMPUTATION FORTH RAY LEFT FOOT;  Surgeon: [name], MD;  Location: [facility];  Service: Orthopedics;  Laterality: Left;  . AMPUTATION Left 08/09/2018    Procedure: Left Transmetatarsal Amputation;  Surgeon: [name], MD;  Location: [facility];  Service: Orthopedics;  Laterality: Left;  . LOWER EXTREMITY ANGIOGRAPHY N/A 07/05/2018   Procedure: LOWER EXTREMITY ANGIOGRAPHY;  Surgeon: Serafina Mitchell, MD;  Location: [facility];  Service: Cardiovascular;  Laterality: N/A;  . PERIPHERAL VASCULAR BALLOON ANGIOPLASTY Left 07/05/2018   Procedure: PERIPHERAL VASCULAR BALLOON ANGIOPLASTY;  Surgeon: Serafina Mitchell, MD;  Location: [facility];  Service: Cardiovascular;  Laterality: Left;  SFA  . TUBAL LIGATION  2002    Family Hx:  Family History  Problem Relation Age  of Onset  . Diabetes Mother   . Diabetes Brother   . Diabetes Daughter   . Diabetes Daughter   . Mental retardation Brother        died from PNA  . Diabetes Maternal Grandmother     Social History:  reports that she quit smoking about 3 years ago. Her smoking use included cigarettes. She started smoking about 4 years ago. She has a 1.00 pack-year smoking history. She has never used smokeless tobacco. She reports current alcohol use. She reports current drug use. Frequency: 4.00 times per week. Drug: Marijuana.  Allergies: No Known Allergies  Medications: Prior to Admission medications   Medication Sig Start Date End Date Taking? Authorizing Provider  amLODipine (NORVASC) 10 MG tablet Take 1 tablet (10 mg total) by mouth daily. 12/24/17 10/07/18 Yes Rice, Resa Miner, MD  aspirin 81 MG EC tablet Take 1 tablet (81 mg total) by mouth daily. 07/12/18  Yes Mosetta Anis, MD  Dolutegravir-Rilpivirine (JULUCA) 50-25 MG TABS Take 1 tablet by mouth daily. Please take with largest meal of the day. 07/05/18  Yes Annia Belt, MD  doxycycline (VIBRA-TABS) 100 MG tablet Take 1 tablet (100 mg total) by mouth 2 (two) times daily. 08/01/18  Yes Rayburn, Neta Mends, PA-C  Insulin Glargine (LANTUS) 100 UNIT/ML Solostar Pen Inject 15 Units into the skin daily at 10  pm. Patient taking differently: Inject 15 Units into the skin at bedtime.  12/24/17  Yes Rice, Resa Miner, MD  labetalol (NORMODYNE) 100 MG tablet TAKE 1 TABLET BY MOUTH 2 TIMES DAILY Patient taking differently: Take 100 mg by mouth 2 (two) times daily.  07/13/18  Yes Chundi, Vahini, MD  liraglutide (VICTOZA) 18 MG/3ML SOPN Inject 0.2 mLs (1.2 mg total) into the skin daily. 02/04/18  Yes Chundi, Vahini, MD  Multiple Vitamin (MULTIVITAMIN WITH MINERALS) TABS tablet Take 1 tablet by mouth daily. 12/24/17  Yes Rice, Resa Miner, MD  oxyCODONE-acetaminophen (PERCOCET/ROXICET) 5-325 MG tablet Take 1 tablet by mouth every 6 (six) hours as needed for moderate pain or severe pain. 08/01/18  Yes Rayburn, Neta Mends, PA-C  silver sulfADIAZINE (SILVADENE) 1 % cream Apply 1 application topically daily. 08/01/18  Yes Rayburn, Neta Mends, PA-C  sodium bicarbonate 650 MG tablet Take 1 tablet (650 mg total) by mouth 2 (two) times daily. 12/24/17  Yes Rice, Resa Miner, MD  triamcinolone cream (KENALOG) 0.1 % Apply 1 application topically 2 (two) times daily. Patient taking differently: Apply 1 application topically 2 (two) times daily as needed (itching).  02/04/18  Yes Chundi, Vahini, MD  citalopram (CELEXA) 20 MG tablet Take 1 tablet (20 mg total) by mouth daily. Patient not taking: Reported on 08/06/2018 12/24/17 12/24/18  Collier Salina, MD  diclofenac sodium (VOLTAREN) 1 % GEL Apply 2 g topically 4 (four) times daily. Patient not taking: Reported on 08/06/2018 12/24/17   Collier Salina, MD  glucose blood (ACCU-CHEK GUIDE) test strip USE TO TEST BLOOD SUGAR 3 TIMES DAILY . Diagnosis code  E11.69, E66.9 07/09/17   Lars Mage, MD  pantoprazole (PROTONIX) 40 MG tablet Take 1 tablet (40 mg total) by mouth daily. Patient not taking: Reported on 08/06/2018 12/24/17   Collier Salina, MD  sennosides-docusate sodium (SENOKOT-S) 8.6-50 MG tablet Take 1 tablet by mouth daily as needed for  constipation. Patient not taking: Reported on 08/06/2018 12/24/17   Collier Salina, MD    I have reviewed the patient's current medications.  Labs:  Results for orders placed  or performed during the hospital encounter of 08/06/18 (from the past 48 hour(s))  Glucose, capillary     Status: Abnormal   Collection Time: 08/09/18 10:03 AM  Result Value Ref Range   Glucose-Capillary 174 (H) 70 - 99 mg/dL  Glucose, capillary     Status: Abnormal   Collection Time: 08/09/18 12:09 PM  Result Value Ref Range   Glucose-Capillary 165 (H) 70 - 99 mg/dL   Comment 1 Notify RN   Glucose, capillary     Status: Abnormal   Collection Time: 08/09/18  4:39 PM  Result Value Ref Range   Glucose-Capillary 129 (H) 70 - 99 mg/dL  Glucose, capillary     Status: Abnormal   Collection Time: 08/09/18  9:08 PM  Result Value Ref Range   Glucose-Capillary 124 (H) 70 - 99 mg/dL  Basic metabolic panel     Status: Abnormal   Collection Time: 08/10/18  3:46 AM  Result Value Ref Range   Sodium 138 135 - 145 mmol/L   Potassium 5.0 3.5 - 5.1 mmol/L   Chloride 104 98 - 111 mmol/L   CO2 24 22 - 32 mmol/L   Glucose, Bld 104 (H) 70 - 99 mg/dL   BUN 40 (H) 6 - 20 mg/dL   Creatinine, Ser 6.28 (H) 0.44 - 1.00 mg/dL   Calcium 8.5 (L) 8.9 - 10.3 mg/dL   GFR calc non Af Amer 8 (L) >60 mL/min   GFR calc Af Amer 9 (L) >60 mL/min   Anion gap 10 5 - 15    Comment: Performed at [facility], [address], [place], [place] [address]  CBC with Differential     Status: Abnormal   Collection Time: 08/10/18  3:46 AM  Result Value Ref Range   WBC 6.9 4.0 - 10.5 K/uL   RBC 2.56 (L) 3.87 - 5.11 MIL/uL   Hemoglobin 7.6 (L) 12.0 - 15.0 g/dL   HCT 23.5 (L) 36.0 - 46.0 %   MCV 91.8 80.0 - 100.0 fL   MCH 29.7 26.0 - 34.0 pg   MCHC 32.3 30.0 - 36.0 g/dL   RDW 14.4 11.5 - 15.5 %   Platelets 257 150 - 400 K/uL   nRBC 0.0 0.0 - 0.2 %   Neutrophils Relative % 59 %   Neutro Abs 4.1 1.7 - 7.7 K/uL   Lymphocytes Relative 22 %    Lymphs Abs 1.5 0.7 - 4.0 K/uL   Monocytes Relative 9 %   Monocytes Absolute 0.6 0.1 - 1.0 K/uL   Eosinophils Relative 10 %   Eosinophils Absolute 0.7 (H) 0.0 - 0.5 K/uL   Basophils Relative 0 %   Basophils Absolute 0.0 0.0 - 0.1 K/uL   Immature Granulocytes 0 %   Abs Immature Granulocytes 0.01 0.00 - 0.07 K/uL    Comment: Performed at [facility] [address] [place], [place] [address]  Glucose, capillary     Status: None   Collection Time: 08/10/18  6:44 AM  Result Value Ref Range   Glucose-Capillary 99 70 - 99 mg/dL  Glucose, capillary     Status: Abnormal   Collection Time: 08/10/18 11:40 AM  Result Value Ref Range   Glucose-Capillary 126 (H) 70 - 99 mg/dL  Basic metabolic panel     Status: Abnormal   Collection Time: 08/10/18  4:14 PM  Result Value Ref Range   Sodium 137 135 - 145 mmol/L   Potassium 4.9 3.5 - 5.1 mmol/L   Chloride  102 98 - 111 mmol/L   CO2 24 22 - 32 mmol/L   Glucose, Bld 76 70 - 99 mg/dL   BUN 48 (H) 6 - 20 mg/dL   Creatinine, Ser 6.61 (H) 0.44 - 1.00 mg/dL   Calcium 8.9 8.9 - 10.3 mg/dL   GFR calc non Af Amer 7 (L) >60 mL/min   GFR calc Af Amer 8 (L) >60 mL/min   Anion gap 11 5 - 15    Comment: Performed at [facility] [address], [place], [place] [address]  Glucose, capillary     Status: None   Collection Time: 08/10/18  4:18 PM  Result Value Ref Range   Glucose-Capillary 71 70 - 99 mg/dL  Glucose, capillary     Status: Abnormal   Collection Time: 08/10/18  9:41 PM  Result Value Ref Range   Glucose-Capillary 125 (H) 70 - 99 mg/dL  Basic metabolic panel     Status: Abnormal   Collection Time: 08/11/18  1:19 AM  Result Value Ref Range   Sodium 136 135 - 145 mmol/L   Potassium 5.5 (H) 3.5 - 5.1 mmol/L   Chloride 102 98 - 111 mmol/L   CO2 26 22 - 32 mmol/L   Glucose, Bld 129 (H) 70 - 99 mg/dL   BUN 41 (H) 6 - 20 mg/dL   Creatinine, Ser 6.71 (H) 0.44 - 1.00 mg/dL   Calcium 8.5 (L) 8.9 - 10.3 mg/dL   GFR calc non Af Amer  7 (L) >60 mL/min   GFR calc Af Amer 8 (L) >60 mL/min   Anion gap 8 5 - 15    Comment: Performed at [facility] [address] [place], [place] [address]  CBC with Differential     Status: Abnormal   Collection Time: 08/11/18  1:19 AM  Result Value Ref Range   WBC 7.4 4.0 - 10.5 K/uL   RBC 2.37 (L) 3.87 - 5.11 MIL/uL   Hemoglobin 7.0 (L) 12.0 - 15.0 g/dL   HCT 22.0 (L) 36.0 - 46.0 %   MCV 92.8 80.0 - 100.0 fL   MCH 29.5 26.0 - 34.0 pg   MCHC 31.8 30.0 - 36.0 g/dL   RDW 14.4 11.5 - 15.5 %   Platelets 244 150 - 400 K/uL   nRBC 0.0 0.0 - 0.2 %   Neutrophils Relative % 56 %   Neutro Abs 4.2 1.7 - 7.7 K/uL   Lymphocytes Relative 18 %   Lymphs Abs 1.3 0.7 - 4.0 K/uL   Monocytes Relative 14 %   Monocytes Absolute 1.0 0.1 - 1.0 K/uL   Eosinophils Relative 12 %   Eosinophils Absolute 0.9 (H) 0.0 - 0.5 K/uL   Basophils Relative 0 %   Basophils Absolute 0.0 0.0 - 0.1 K/uL   Immature Granulocytes 0 %   Abs Immature Granulocytes 0.03 0.00 - 0.07 K/uL    Comment: Performed at [facility] [address] [place], [place] [address]  Basic metabolic panel     Status: Abnormal   Collection Time: 08/11/18  3:42 AM  Result Value Ref Range   Sodium 136 135 - 145 mmol/L   Potassium 5.6 (H) 3.5 - 5.1 mmol/L   Chloride 102 98 - 111 mmol/L   CO2 25 22 - 32 mmol/L   Glucose, Bld 137 (H) 70 - 99 mg/dL   BUN 44 (H) 6 - 20 mg/dL   Creatinine, Ser 6.69 (H) 0.44 - 1.00 mg/dL  Calcium 8.7 (L) 8.9 - 10.3 mg/dL   GFR calc non Af Amer 7 (L) >60 mL/min   GFR calc Af Amer 8 (L) >60 mL/min   Anion gap 9 5 - 15    Comment: Performed at [facility] [address], [place], [place] [address]  CBC     Status: Abnormal   Collection Time: 08/11/18  4:45 AM  Result Value Ref Range   WBC 8.0 4.0 - 10.5 K/uL   RBC 2.48 (L) 3.87 - 5.11 MIL/uL   Hemoglobin 7.3 (L) 12.0 - 15.0 g/dL   HCT 23.1 (L) 36.0 - 46.0 %   MCV 93.1 80.0 - 100.0 fL   MCH 29.4 26.0 - 34.0 pg   MCHC 31.6 30.0 - 36.0  g/dL   RDW 14.1 11.5 - 15.5 %   Platelets 238 150 - 400 K/uL   nRBC 0.0 0.0 - 0.2 %    Comment: Performed at [facility], [place] [address] [place], [place] [address]  Protime-INR     Status: None   Collection Time: 08/11/18  4:45 AM  Result Value Ref Range   Prothrombin Time 13.3 11.4 - 15.2 seconds   INR 1.02     Comment: Performed at [facility] [address], [place], [place] [address]  APTT     Status: Abnormal   Collection Time: 08/11/18  4:45 AM  Result Value Ref Range   aPTT 40 (H) 24 - 36 seconds    Comment:        IF BASELINE aPTT IS ELEVATED, SUGGEST PATIENT RISK ASSESSMENT BE USED TO DETERMINE APPROPRIATE ANTICOAGULANT THERAPY. Performed at [facility], [place] [address] [place], [place] [address]   Glucose, capillary     Status: Abnormal   Collection Time: 08/11/18  6:34 AM  Result Value Ref Range   Glucose-Capillary 126 (H) 70 - 99 mg/dL     ROS:  Pertinent items are noted in HPI.  Physical Exam: Vitals:   08/10/18 1945 08/11/18 0415  BP: (!) 152/98 140/85  Pulse: 86 82  Resp: 20 20  Temp: 99.1 F (37.3 C) 98.5 F (36.9 C)  SpO2: 98% 97%     General: comfortably seated in bed HEENT: MMM Eyes: EOMI Neck: supple, no JVD Heart: RRR, no rub Lungs: clear to bases Abdomen: soft nontender Extremities: L TMT with wound vac, no edema Skin: no rashes Neuro: no asterixis, normally conversant  Assessment/Plan: **CKD 5:  Not overtly uremic.  Has had vein mapping during prior hospitalization but no permanent access in place yet.  I see no need for dialysis at this time.  I think her potassium can be managed conservatively, see below.  She has f/u arranged with Dr Posey Pronto in 09/2018 already - this appt can be kept.   **Hyperkalemia:  K 5.5-5.6 today, was 4.9 yesterday.  Recommended lokelma 10g dose this AM.  Low K diet. Provided her with written instructions to take home.   **Anemia: Hb in the 7s post op, was in 8s.  Previously dosed with  aranesp during last hospitalization. 07/02/2018 iron sat was 3% but no IV iron given due to foot infection at that time.  She is on last day of vancomycin today for her foot infection.  Will dose with IV iron while admitted, discussed with her.  Would not keep admitted to receive these doses.  Dose with aranesp 100 today as well.   Will look to see if outpatient ESA  has been arranged - I suspect not as she says she is uninsured.   **metabolic acidosis: managed with po bicarbonate.    **HIV: follows with ID.  Has been switched to tenofovir free regimen.    Justin Mend 08/11/2018, 8:34 AM

## 2018-08-11 NOTE — Progress Notes (Signed)
Text Paged Glenna Fellows, MD with IM of pt's NSR EKG.

## 2018-08-11 NOTE — Progress Notes (Signed)
Text Paged IM on call to notify of current labs of: Hgb 7.0 and K+ 5.5. Awaiting response.

## 2018-08-11 NOTE — Progress Notes (Signed)
   Subjective: Ms. Sweeten states that her left lower extremity pain has improved significantly since yesterday.  She states that she has not had a bowel movement though since admission.  She denies any abdominal pain.  She does not have any other acute complaints.  Objective:  Vital signs in last 24 hours: Vitals:   08/10/18 0453 08/10/18 1502 08/10/18 1945 08/11/18 0415  BP: (!) 155/87 124/79 (!) 152/98 140/85  Pulse: 87 84 86 82  Resp: 18 16 20 20   Temp: 98.5 F (36.9 C)  99.1 F (37.3 C) 98.5 F (36.9 C)  TempSrc: Oral  Oral Oral  SpO2: 100% 100% 98% 97%  Weight:      Height:       Physical Exam Vitals signs and nursing note reviewed.  Constitutional:      Appearance: Normal appearance.  Musculoskeletal:     Comments: Right foot with transmetatarsal amputation.  Wound with wound VAC on.  Serosanguineous fluid from wound VAC.  Neurological:     Mental Status: She is alert.  Psychiatric:        Mood and Affect: Mood normal.        Behavior: Behavior normal.     Assessment/Plan:  Principal Problem:   Wound dehiscence s/p L transmetatarsal amputation Active Problems:   Diabetes mellitus type 2 in obese (HCC)   Human immunodeficiency virus (HIV) disease (HCC)   CKD (chronic kidney disease), stage V (HCC)   Wound infection s/p L transmetatarsal amputation   Ms. Smet was admitted for a left foot skin and soft tissue infection and is now POD2 from a left transmetatarsal amputation.   Left Foot Wound Soft Tissue Infection s/p transmetatarsal amputation: She pain has been well-controlled with her current pain regiment (see below).  PT recommends home health PT. - Oxycodone 5-10 mg q4h PRN pain -IV Dilaudid 1 mg every 8 hours as needed breakthrough pain - Methocarbamol 1000 mg q6h PRN muscle spasms - Continue PT  CKD stage IV: Creatinine stable today (6.69). Will repeat BMP this afternoon. Nephrology consulted and recommends conservative management. -Continue to  monitor renal function  Hyperkalemia: K 5.5-5.6 this morning. Treated with kaexalate and Lasix. Will repeat BMP this afternoon. Plan on adding Lokelma 10 g dose if still elevated. - Follow-up repeat BMP  Normocytic anemia: Hb 7s post-op. Will get IV iron and aranesp while admitted.  - Give ferric gluconate and aranesp  Type 2 diabetes:Blood sugars remain well controlled on Lantus 7 units qhs and SSI -Continue Lantus 7 units qhs -Continue sliding scale insulin  HIV: -Continue homeDolutegravir-rilpirivine  Constipation: Likely due to opioid use - Schedule Miralax daily - Schedule Senokot 2 tab QD  - Continue bisacodyl suppository 10 mg QD PRN  Depression:  - Continue Celexa 20 mg QD  Left breast mass: -She will need follow-up as an outpatient  Diet:Renal, carb modified, fluid restricted VTEPPX:Heparin CODE STATUS: Full  Dispo: Anticipated discharge in0-1 days  Carroll Sage, MD 08/11/2018, 1:44 PM Pager: [phone]

## 2018-08-11 NOTE — Progress Notes (Signed)
Patient ID: Kathy Frank, female   DOB: 16-Nov-1975, 43 y.o.   MRN: [id number] Postoperative day 2 transmetatarsal amputation.  The wound VAC is functioning well.  Patient states she feels like she can be discharged to home with home health therapy.

## 2018-08-11 NOTE — Care Management Note (Signed)
Case Management Note  Patient Details  Name: Kathy Frank MRN: [id number] Date of Birth: Aug 03, 1976  Subjective/Objective:     Presents with Dehiscence Left Foot, hx of  HIV, diabetes, peripheral vascular disease, DM, CKD and   recent left fourth toe amputation. From home with daughters          S/P Left Transmetatarsal Amputation. Application of Praveena wound VAC ,08/09/2018    [facility]            PCP:  Lars Mage  Action/Plan: Pt states she doesn't want to d/c to a rehab facility. Agreeable to home health services. States daughters will assist with her care once d/c.   Per MD notes pt will d/c with wound vac( Preveena). NCM to provide choice list for home health services.  Pt with transportation to home.  Expected Discharge Date:  08/08/18               Expected Discharge Plan:  [place]  In-House Referral:  NA  Discharge planning Services  CM Consult  Post Acute Care Choice:    Choice offered to:  Patient  DME Arranged:  3-N-1(Owns rolling walker) DME Agency:  [place]:    [place] Agency:     Status of Service:  In process, will continue to follow  If discussed at Long Length of Stay Meetings, dates discussed:    Additional Comments:  Sharin Mons, RN 08/11/2018, 8:53 AM

## 2018-08-11 NOTE — Progress Notes (Signed)
Physical Therapy Treatment Patient Details Name: Kathy Frank MRN: [id number] DOB: Dec 18, 1975 Today's Date: 08/11/2018    History of Present Illness Patient is a 43 year old woman diabetic insensate neuropathy status post ray amputation which has had progressive wound dehiscence. s/p Left Transmetatarsal Amputation.08/09/18    PT Comments    Patient seen for mobility progression. Pt is progressing well toward PT goals and tolerated gait and stair training well. Pt requires min guard/min A for OOB mobility with use of crutches. Pt is able to ascend/descend steps simulating home entrance. Continue to progress as tolerated.     Follow Up Recommendations  Home health PT;Supervision/Assistance - 24 hour     Equipment Recommendations  3in1 (PT)    Recommendations for Other Services       Precautions / Restrictions Precautions Precautions: Fall Restrictions Weight Bearing Restrictions: Yes LLE Weight Bearing: Non weight bearing    Mobility  Bed Mobility Overal bed mobility: Modified Independent Bed Mobility: Supine to Sit              Transfers Overall transfer level: Needs assistance Equipment used: Rolling walker (2 wheeled);Crutches Transfers: Sit to/from [facility] Sit to Stand: Min guard;Supervision Stand pivot transfers: Min guard       General transfer comment: cues for use of crutches to stand and sit; overall pt is able to transfer without phsyical assist but not need supervision/min guard for safety especially given poor eccentric loading when fatigued   Ambulation/Gait Ambulation/Gait assistance: Min guard Gait Distance (Feet): 150 Feet Assistive device: Rolling walker (2 wheeled);Crutches Gait Pattern/deviations: Step-to pattern Gait velocity: decreased   General Gait Details: ambulated mostly with crutches; cues for 2 point gait and safe use of cructhes and weight bearing through hands   Stairs Stairs: Yes Stairs assistance: Min  assist;Min guard Stair Management: (single rail and single crutch with crtuch on L side ) Number of Stairs: (2 steps X 2 trials) General stair comments: cues for sequencing/technique and safety   Wheelchair Mobility    Modified Rankin (Stroke Patients Only)       Balance Overall balance assessment: Needs assistance Sitting-balance support: Feet supported;No upper extremity supported Sitting balance-Leahy Scale: Good     Standing balance support: Single extremity supported;During functional activity Standing balance-Leahy Scale: Poor Standing balance comment: requires at least single UE assist to maintain balance with NWB on L LE                            Cognition Arousal/Alertness: Awake/alert Behavior During Therapy: WFL for tasks assessed/performed;Flat affect Overall Cognitive Status: Within Functional Limits for tasks assessed                                        Exercises      General Comments        Pertinent Vitals/Pain Pain Assessment: 0-10 Pain Score: 6  Pain Location: L foot Pain Descriptors / Indicators: Throbbing;Sore Pain Intervention(s): Monitored during session;Premedicated before session;Repositioned    Home Living                      Prior Function            PT Goals (current goals can now be found in the care plan section) Acute Rehab PT Goals Patient Stated Goal: have less pain Progress towards PT goals: Progressing  toward goals    Frequency    Min 3X/week      PT Plan Current plan remains appropriate    Co-evaluation              AM-PAC PT "6 Clicks" Mobility   Outcome Measure  Help needed turning from your back to your side while in a flat bed without using bedrails?: None Help needed moving from lying on your back to sitting on the side of a flat bed without using bedrails?: None Help needed moving to and from a bed to a chair (including a wheelchair)?: A Little Help needed  standing up from a chair using your arms (e.g., wheelchair or bedside chair)?: A Little Help needed to walk in hospital room?: A Little Help needed climbing 3-5 steps with a railing? : A Little 6 Click Score: 20    End of Session Equipment Utilized During Treatment: Gait belt Activity Tolerance: Patient tolerated treatment well Patient left: in chair;with call bell/phone within reach Nurse Communication: Mobility status PT Visit Diagnosis: Unsteadiness on feet (R26.81);Other abnormalities of gait and mobility (R26.89);Muscle weakness (generalized) (M62.81);Difficulty in walking, not elsewhere classified (R26.2);Pain Pain - Right/Left: Left Pain - part of body: Ankle and joints of foot     Time: 1505-1540 PT Time Calculation (min) (ACUTE ONLY): 35 min  Charges:  $Gait Training: 23-37 mins                     Earney Navy, PTA Acute Rehabilitation Services Pager: [phone] Office: [phone]     Darliss Cheney 08/11/2018, 3:59 PM

## 2018-08-12 ENCOUNTER — Telehealth: Payer: Self-pay | Admitting: *Deleted

## 2018-08-12 ENCOUNTER — Telehealth: Payer: Self-pay

## 2018-08-12 DIAGNOSIS — K5900 Constipation, unspecified: Secondary | ICD-10-CM

## 2018-08-12 LAB — CBC WITH DIFFERENTIAL/PLATELET
Abs Immature Granulocytes: 0.03 10*3/uL (ref 0.00–0.07)
Basophils Absolute: 0 10*3/uL (ref 0.0–0.1)
Basophils Relative: 0 %
Eosinophils Absolute: 0.7 10*3/uL — ABNORMAL HIGH (ref 0.0–0.5)
Eosinophils Relative: 7 %
HCT: 22.4 % — ABNORMAL LOW (ref 36.0–46.0)
Hemoglobin: 7.1 g/dL — ABNORMAL LOW (ref 12.0–15.0)
Immature Granulocytes: 0 %
Lymphocytes Relative: 8 %
Lymphs Abs: 0.8 10*3/uL (ref 0.7–4.0)
MCH: 29.1 pg (ref 26.0–34.0)
MCHC: 31.7 g/dL (ref 30.0–36.0)
MCV: 91.8 fL (ref 80.0–100.0)
Monocytes Absolute: 1.2 10*3/uL — ABNORMAL HIGH (ref 0.1–1.0)
Monocytes Relative: 12 %
Neutro Abs: 7.3 10*3/uL (ref 1.7–7.7)
Neutrophils Relative %: 73 %
Platelets: 239 10*3/uL (ref 150–400)
RBC: 2.44 MIL/uL — ABNORMAL LOW (ref 3.87–5.11)
RDW: 14 % (ref 11.5–15.5)
WBC: 10 10*3/uL (ref 4.0–10.5)
nRBC: 0 % (ref 0.0–0.2)

## 2018-08-12 LAB — BASIC METABOLIC PANEL
Anion gap: 14 (ref 5–15)
BUN: 41 mg/dL — ABNORMAL HIGH (ref 6–20)
CO2: 22 mmol/L (ref 22–32)
Calcium: 8.6 mg/dL — ABNORMAL LOW (ref 8.9–10.3)
Chloride: 98 mmol/L (ref 98–111)
Creatinine, Ser: 6.97 mg/dL — ABNORMAL HIGH (ref 0.44–1.00)
GFR calc Af Amer: 8 mL/min — ABNORMAL LOW (ref >60)
GFR calc non Af Amer: 7 mL/min — ABNORMAL LOW (ref >60)
Glucose, Bld: 114 mg/dL — ABNORMAL HIGH (ref 70–99)
Potassium: 4.7 mmol/L (ref 3.5–5.1)
Sodium: 134 mmol/L — ABNORMAL LOW (ref 135–145)

## 2018-08-12 LAB — GLUCOSE, CAPILLARY
Glucose-Capillary: 132 mg/dL — ABNORMAL HIGH (ref 70–99)
Glucose-Capillary: 94 mg/dL (ref 70–99)

## 2018-08-12 MED ORDER — SEVELAMER CARBONATE 800 MG PO TABS: 800.0000 mg | ORAL_TABLET | Freq: Three times a day (TID) | ORAL | Status: DC

## 2018-08-12 MED ORDER — SENNA 8.6 MG PO TABS: 2.0000 | ORAL_TABLET | Freq: Two times a day (BID) | ORAL | 0 refills | Status: DC

## 2018-08-12 MED ORDER — OXYCODONE HCL 5 MG PO TABS: 5.0000 mg | ORAL_TABLET | Freq: Four times a day (QID) | ORAL | 0 refills | Status: DC | PRN

## 2018-08-12 MED ORDER — POLYETHYLENE GLYCOL 3350 17 G PO PACK: 17.0000 g | PACK | Freq: Every day | ORAL | 0 refills | Status: DC

## 2018-08-12 MED ORDER — CITALOPRAM HYDROBROMIDE 20 MG PO TABS: 20.0000 mg | ORAL_TABLET | Freq: Every day | ORAL | 0 refills | Status: DC

## 2018-08-12 NOTE — Care Management Note (Signed)
Case Management Note  Patient Details  Name: JEANNE DIEFENDORF MRN: [id number] Date of Birth: 1975-09-15  Subjective/Objective:    S/P Left Transmetatarsal Amputation. Application of Praveena wound VAC ,08/09/2018                Action/Plan: Case manager spoke with patient concerning discharge plan and DME. Referral called to Joen Laura, [facility]iaison. Patient says her daughters will assist her at discharge. VAC to remain in place unitl she follows up with Dr. Sharol Given.    Expected Discharge Date:  08/12/18               Expected Discharge Plan:  [place]  In-House Referral:  NA  Discharge planning Services  CM Consult  Post Acute Care Choice:  Home Health, Durable Medical Equipment Choice offered to:  Patient  DME Arranged:  3-N-1(Owns rolling walker) DME Agency:  [place]:  PT [place]:  [facility]  Status of Service:  Completed, signed off  If discussed at [employer] of Stay Meetings, dates discussed:    Additional Comments:  Ninfa Meeker, RN 08/12/2018, 11:02 AM

## 2018-08-12 NOTE — Telephone Encounter (Signed)
Hospital TOC per Dr Alfonse Spruce, discharge 08/12/18, appt 08/19/18.

## 2018-08-12 NOTE — Progress Notes (Signed)
[facility]    NEPHROLOGY PROGRESS NOTE  SUBJECTIVE: No acute events.  Denies appetite loss, fevers, chills, fatigue, or dysuria.  Does have intermittent nausea which she believes is due to the antibiotics.  Would vac in place.  No skin rash.  Denies CP, SOB, or LE edema.  All other ROS Negative.     OBJECTIVE:  Vitals:   08/12/18 1101 08/12/18 1119  BP: 131/75 (!) 157/77  Pulse: 87 (!) 101  Resp:    Temp:    SpO2: 99% (!) 87%   I/O last 3 completed shifts: In: 58 [P.O.:1200; IV Piggyback:500] Out: 500 [Urine:500]   [place]:  AAOx3 NAD HEENT: MMM [place] AT anicteric sclera Neck:  No JVD, no adenopathy CV:  Heart RRR  Lungs:  L/S CTA bilaterally Abd:  abd SNT/ND with normal BS GU:  Bladder non-palpable Extremities:  No LE edema.  Wound vac in place Skin:  No skin rash  MEDICATIONS:  Medications: . amLODipine  10 mg Oral Daily  . aspirin  81 mg Oral Daily  . citalopram  20 mg Oral Daily  . dolutegravir  50 mg Oral Q breakfast   And  . rilpivirine  25 mg Oral Q breakfast  . heparin  5,000 Units Subcutaneous Q8H  . insulin aspart  0-9 Units Subcutaneous TID WC  . insulin glargine  7 Units Subcutaneous QHS  . labetalol  100 mg Oral BID  . multivitamin with minerals  1 tablet Oral Daily  . polyethylene glycol  17 g Oral Daily  . senna  2 tablet Oral BID  . sodium bicarbonate  650 mg Oral BID   acetaminophen, HYDROmorphone (DILAUDID) injection, methocarbamol **OR** [DISCONTINUED] methocarbamol (ROBAXIN) IV, [DISCONTINUED] metoCLOPramide **OR** metoCLOPramide (REGLAN) injection, ondansetron **OR** ondansetron (ZOFRAN) IV, oxyCODONE, triamcinolone cream Allergies: Patient has no known allergies.    LABS:  CBC Latest Ref Rng & Units 08/12/2018 08/11/2018 08/11/2018  WBC 4.0 - 10.5 K/uL 10.0 8.0 7.4  Hemoglobin 12.0 - 15.0 g/dL 7.1(L) 7.3(L) 7.0(L)  Hematocrit 36.0 - 46.0 % 22.4(L) 23.1(L) 22.0(L)  Platelets 150 - 400 K/uL 239 238 244    CMP Latest Ref  Rng & Units 08/12/2018 08/11/2018 08/11/2018  Glucose 70 - 99 mg/dL 114(H) 142(H) 137(H)  BUN 6 - 20 mg/dL 41(H) 41(H) 44(H)  Creatinine 0.44 - 1.00 mg/dL 6.97(H) 6.91(H) 6.69(H)  Sodium 135 - 145 mmol/L 134(L) 136 136  Potassium 3.5 - 5.1 mmol/L 4.7 4.9 5.6(H)  Chloride 98 - 111 mmol/L 98 101 102  CO2 22 - 32 mmol/L 22 24 25   Calcium 8.9 - 10.3 mg/dL 8.6(L) 8.6(L) 8.7(L)  Total Protein 6.5 - 8.1 g/dL - - -  Total Bilirubin 0.3 - 1.2 mg/dL - - -  Alkaline Phos 38 - 126 U/L - - -  AST 15 - 41 U/L - - -  ALT 0 - 44 U/L - - -    Lab Results  Component Value Date   CALCIUM 8.6 (L) 08/12/2018   CAION 1.06 (L) 10/24/2008   PHOS 6.4 (H) 08/11/2018       Component Value Date/Time   COLORURINE YELLOW 07/02/2018 2158   APPEARANCEUR HAZY (A) 07/02/2018 2158   APPEARANCEUR Cloudy (A) 04/23/2016 1530   LABSPEC 1.013 07/02/2018 2158   PHURINE 5.0 07/02/2018 2158   GLUCOSEU 50 (A) 07/02/2018 2158   HGBUR MODERATE (A) 07/02/2018 2158   BILIRUBINUR NEGATIVE 07/02/2018 2158   BILIRUBINUR Negative 04/23/2016 1530   KETONESUR NEGATIVE 07/02/2018 2158   PROTEINUR 100 (  A) 07/02/2018 2158   UROBILINOGEN 0.2 05/30/2015 1316   NITRITE NEGATIVE 07/02/2018 2158   LEUKOCYTESUR NEGATIVE 07/02/2018 2158   LEUKOCYTESUR Negative 04/23/2016 1530      Component Value Date/Time   HCO3 27.8 (H) 05/30/2015 0930   TCO2 29 05/30/2015 0930   ACIDBASEDEF TEST WILL BE CREDITED 05/30/2015 0854   O2SAT 60.0 05/30/2015 0930       Component Value Date/Time   IRON 7 (L) 07/02/2018 2331   IRON 68 02/04/2018 1705   TIBC 252 07/02/2018 2331   FERRITIN 27 02/04/2018 1705   IRONPCTSAT 3 (L) 07/02/2018 2331       ASSESSMENT/PLAN:     Problem List Items Addressed This Visit      Endocrine   Diabetes mellitus type 2 in obese (HCC) (Chronic)   Relevant Medications   aspirin chewable tablet 81 mg   insulin aspart (novoLOG) injection 0-9 Units   insulin glargine (LANTUS) injection 7 Units   Other Relevant Orders    Diet Carb Modified     Other   Depression (Chronic)   Relevant Medications   citalopram (CELEXA) tablet 20 mg   citalopram (CELEXA) 20 MG tablet   * (Principal) Wound dehiscence s/p L transmetatarsal amputation   Wound infection s/p L transmetatarsal amputation - Primary   Relevant Medications   vancomycin (VANCOCIN) 1,500 mg in sodium chloride 0.9 % 500 mL IVPB (Completed)   dolutegravir (TIVICAY) tablet 50 mg   rilpivirine (EDURANT) tablet 25 mg   ceFAZolin (ANCEF) IVPB 2g/100 mL premix (Completed)      1.  CKD stage V.  No clear uremic symptoms.  Will hold off on HD now. 2.  Hyperphosphatemia.  Will add sevelamer with meals. 3.  HTN.  Overall BP stable.  Continue current Rx for now. 4.  DM.  Continue current Rx.   5.  Anemia.  Would add oral iron as OP.

## 2018-08-12 NOTE — Telephone Encounter (Signed)
Call from [facility] pharmacy - they are having trouble filling rxs from Dr Alfonse Spruce on this pt. Text message sent to Dr Alfonse Spruce.

## 2018-08-12 NOTE — Progress Notes (Signed)
Physical Therapy Treatment Patient Details Name: Kathy Frank MRN: [id number] DOB: 1976-06-15 Today's Date: 08/12/2018    History of Present Illness Patient is a 43 year old woman diabetic insensate neuropathy status post ray amputation which has had progressive wound dehiscence. s/p Left Transmetatarsal Amputation.08/09/18    PT Comments    Patient seen for mobility progression. Pt tolerated session well but with c/o feeling more fatigued today. This session focused on gait and stair training with crutches.  Current plan remains appropriate.   Follow Up Recommendations  Home health PT;Supervision/Assistance - 24 hour     Equipment Recommendations  3in1 (PT)(pt has equipment in room)    Recommendations for Other Services       Precautions / Restrictions Precautions Precautions: Fall Restrictions Weight Bearing Restrictions: Yes LLE Weight Bearing: Non weight bearing    Mobility  Bed Mobility Overal bed mobility: Modified Independent                Transfers Overall transfer level: Needs assistance Equipment used: Crutches Transfers: Sit to/from Stand Sit to Stand: Supervision         General transfer comment: supervision for safety  Ambulation/Gait Ambulation/Gait assistance: Min guard;Min assist Gait Distance (Feet): 200 Feet Assistive device: Crutches Gait Pattern/deviations: Step-to pattern Gait velocity: decreased   General Gait Details: unsteady initially but no overt LOB   Stairs Stairs: Yes Stairs assistance: Min guard Stair Management: (single rail and single crutch with crtuch on L side ) Number of Stairs: (2 steps X 2 trials) General stair comments: cues for sequencing/technique    Wheelchair Mobility    Modified Rankin (Stroke Patients Only)       Balance Overall balance assessment: Needs assistance Sitting-balance support: Feet supported;No upper extremity supported Sitting balance-Leahy Scale: Good     Standing balance  support: Single extremity supported;During functional activity Standing balance-Leahy Scale: Poor                              Cognition Arousal/Alertness: Awake/alert Behavior During Therapy: WFL for tasks assessed/performed;Flat affect Overall Cognitive Status: Within Functional Limits for tasks assessed                                        Exercises      General Comments        Pertinent Vitals/Pain Pain Assessment: Faces Faces Pain Scale: Hurts little more Pain Location: L foot in dependent position Pain Descriptors / Indicators: Throbbing;Sore Pain Intervention(s): Limited activity within patient's tolerance;Monitored during session;Premedicated before session;Repositioned    Home Living                      Prior Function            PT Goals (current goals can now be found in the care plan section) Acute Rehab PT Goals Patient Stated Goal: have less pain Progress towards PT goals: Progressing toward goals    Frequency    Min 3X/week      PT Plan Current plan remains appropriate    Co-evaluation              AM-PAC PT "6 Clicks" Mobility   Outcome Measure  Help needed turning from your back to your side while in a flat bed without using bedrails?: None Help needed moving from lying on your back to sitting  on the side of a flat bed without using bedrails?: None Help needed moving to and from a bed to a chair (including a wheelchair)?: A Little Help needed standing up from a chair using your arms (e.g., wheelchair or bedside chair)?: A Little Help needed to walk in hospital room?: A Little Help needed climbing 3-5 steps with a railing? : A Little 6 Click Score: 20    End of Session Equipment Utilized During Treatment: Gait belt Activity Tolerance: Patient tolerated treatment well Patient left: with call bell/phone within reach;in bed Nurse Communication: Mobility status PT Visit Diagnosis: Unsteadiness on  feet (R26.81);Other abnormalities of gait and mobility (R26.89);Muscle weakness (generalized) (M62.81);Difficulty in walking, not elsewhere classified (R26.2);Pain Pain - Right/Left: Left Pain - part of body: Ankle and joints of foot     Time: 7681-1572 PT Time Calculation (min) (ACUTE ONLY): 25 min  Charges:  $Gait Training: 23-37 mins                     Earney Navy, PTA Acute Rehabilitation Services Pager: [phone] Office: [phone]     Darliss Cheney 08/12/2018, 2:20 PM

## 2018-08-12 NOTE — Progress Notes (Signed)
   Subjective: Ms. Munshi pain has significantly improved since yesterday.  She also had a bowel movement this morning and is overall feeling much better.  She states that she is ready to go home.  She would like to speak with social work prior to discharge to discuss disability.  We will arrange this.  Objective:  Vital signs in last 24 hours: Vitals:   08/11/18 0415 08/11/18 1500 08/11/18 2040 08/12/18 0426  BP: 140/85 138/72 131/80 135/74  Pulse: 82 81 88 93  Resp: 20 20 16 16   Temp: 98.5 F (36.9 C) 98.4 F (36.9 C) 98.4 F (36.9 C) 98.2 F (36.8 C)  TempSrc: Oral Oral Oral Oral  SpO2: 97% 98% 99% 100%  Weight:      Height:       Physical Exam Vitals signs and nursing note reviewed.  Constitutional:      Appearance: Normal appearance.  Musculoskeletal:     Comments: Left foot wound clean dry intact with wound VAC in place.  Neurological:     Mental Status: She is alert.  Psychiatric:        Mood and Affect: Mood normal.        Behavior: Behavior normal.     Assessment/Plan:  Principal Problem:   Wound dehiscence s/p L transmetatarsal amputation Active Problems:   Diabetes mellitus type 2 in obese (HCC)   Human immunodeficiency virus (HIV) disease (HCC)   CKD (chronic kidney disease), stage V (HCC)   Wound infection s/p L transmetatarsal amputation   Hyperkalemia  Ms. Smithwas admitted for a left foot skin and soft tissue infection and is now POD3 from a left transmetatarsal amputation.   Left Foot Wound Soft Tissue Infections/p transmetatarsal amputation: Her pain remains well-controlled on her current pain regiment (see below).  She will be discharged home today with home PT.  She will have the wound VAC for 1 week and follow-up with Dr. Sharol Given as an outpatient in 1 week. -Oxycodone 5-10 mg q4h PRN pain x 7 days for post-op pain - Ordered home PT  CKD stage IV: Creatinine stable today (6.97 today). She has a follow-up appt with nephro in  09/2018.  Hyperkalemia: Resolved with kaexalate and Lasix treatment.   Normocytic anemia: Hb 7s post-op. S/p IV iron and aranesp. She will need a repeat CBC at hospital follow-up visit.  Type 2 diabetes:Blood sugars remain well controlled on Lantus 7 units qhs and SSI. Will restart her home dose of Lantus at discharge.  HIV: -Continue homeDolutegravir-rilpirivine  Constipation: Likely due to opioid use.  She did have one bowel movement this morning. -Sent a prescription for MiraLAX and Senokot  Depression: - Sent in a new prescription for Celexa 20 mg QD.  Left breast mass: -She will need follow-up as an outpatient  Diet:Renal, carb modified, fluid restricted VTEPPX:Heparin CODE STATUS: Full  Dispo: Anticipated discharge today.  Carroll Sage, MD 08/12/2018, 6:50 AM Pager: [phone]

## 2018-08-12 NOTE — Telephone Encounter (Signed)
[facility] calls to confirm dr Angelia Mould is an attending Also they will begin care Monday 08/15/2018

## 2018-08-12 NOTE — Telephone Encounter (Signed)
Thanks. New RX sent by Dr. Koleen Distance.

## 2018-08-12 NOTE — Progress Notes (Signed)
Subjective: 3 Days Post-Op Procedure(s) (LRB): Left Transmetatarsal Amputation (Left) Patient reports pain as mild.    Objective: Vital signs in last 24 hours: Temp:  [98.2 F (36.8 C)-98.4 F (36.9 C)] 98.2 F (36.8 C) (01/03 0426) Pulse Rate:  [81-93] 93 (01/03 0426) Resp:  [16-20] 16 (01/03 0426) BP: (131-138)/(72-80) 135/74 (01/03 0426) SpO2:  [98 %-100 %] 100 % (01/03 0426)  Intake/Output from previous day: 01/02 0701 - 01/03 0700 In: 480 [P.O.:480] Out: 500 [Urine:500] Intake/Output this shift: No intake/output data recorded.  Recent Labs    08/10/18 0346 08/11/18 0119 08/11/18 0445 08/12/18 0237  HGB 7.6* 7.0* 7.3* 7.1*   Recent Labs    08/11/18 0445 08/12/18 0237  WBC 8.0 10.0  RBC 2.48* 2.44*  HCT 23.1* 22.4*  PLT 238 239   Recent Labs    08/11/18 1331 08/12/18 0237  NA 136 134*  K 4.9 4.7  CL 101 98  CO2 24 22  BUN 41* 41*  CREATININE 6.91* 6.97*  GLUCOSE 142* 114*  CALCIUM 8.6* 8.6*   Recent Labs    08/11/18 0445  INR 1.02    Left foot VAC dressing in place and functioning well.     Assessment/Plan: 3 Days Post-Op Procedure(s) (LRB): Left Transmetatarsal Amputation (Left) Okay for discharge home with Prevena VAC from ortho standpoint.  Should avoid weight bearing on left foot as much as possible, can weight bear through heel. Continue Prevena VAC until office visit next week.    [place]

## 2018-08-15 ENCOUNTER — Telehealth (INDEPENDENT_AMBULATORY_CARE_PROVIDER_SITE_OTHER): Payer: Self-pay | Admitting: Orthopedic Surgery

## 2018-08-15 ENCOUNTER — Encounter: Payer: Self-pay | Admitting: Internal Medicine

## 2018-08-15 DIAGNOSIS — F4323 Adjustment disorder with mixed anxiety and depressed mood: Secondary | ICD-10-CM | POA: Insufficient documentation

## 2018-08-15 NOTE — Telephone Encounter (Signed)
Received call from [place] from [facility]. He advised patient is having surgery on 08/19/18.Sonia Side advised will hold off on seeing patient until he get further orders from Dr Sharol Given after patient has surgery. The number to contact Sonia Side is [phone]

## 2018-08-15 NOTE — Telephone Encounter (Signed)
Can you please call pt and make post op appt for her to see either shawn or Dr. Sharol Given? She is s/p a left transmet amp on 08/09/18.  Called and sw Sonia Side to advise we will see the pt in office first and then call with orders.

## 2018-08-15 NOTE — Telephone Encounter (Signed)
I do not have anything stating to schedule her.  The last thing I scheduled was for 08/09/18.  I do not think she has been seen post op in the office yet.

## 2018-08-15 NOTE — Telephone Encounter (Signed)
Is this pt sch for surgery this week?

## 2018-08-17 ENCOUNTER — Ambulatory Visit: Payer: Medicaid Other | Admitting: Family

## 2018-08-17 ENCOUNTER — Telehealth (INDEPENDENT_AMBULATORY_CARE_PROVIDER_SITE_OTHER): Payer: Self-pay | Admitting: *Deleted

## 2018-08-17 ENCOUNTER — Encounter (HOSPITAL_COMMUNITY): Payer: Medicaid Other

## 2018-08-17 NOTE — Telephone Encounter (Signed)
Pt has an appt for tomorrow morning. Does not want to remove the wound vac herslef. States that the malfunction took place last night.

## 2018-08-17 NOTE — Telephone Encounter (Signed)
Santiago Glad with Nurse care management called stating she had called pt to check on how things were and pt informed her that she was sent home with wound vac and last night it stopped working so she disconnected, pt can not call out d/t having phone issues and can only receive incoming calls. Pt wants to know what needs to be done and also states she has not heard from [facility].   Please call back at [phone] or you can call the nurse case manager at [phone]  Santiago Glad did say to please keep calling back since she can not call out to return call.

## 2018-08-18 ENCOUNTER — Encounter (INDEPENDENT_AMBULATORY_CARE_PROVIDER_SITE_OTHER): Payer: Self-pay | Admitting: Physician Assistant

## 2018-08-18 ENCOUNTER — Ambulatory Visit (INDEPENDENT_AMBULATORY_CARE_PROVIDER_SITE_OTHER): Payer: Medicaid Other | Admitting: Physician Assistant

## 2018-08-18 VITALS — Ht 65.0 in | Wt 167.0 lb

## 2018-08-18 DIAGNOSIS — E1142 Type 2 diabetes mellitus with diabetic polyneuropathy: Secondary | ICD-10-CM

## 2018-08-18 DIAGNOSIS — E440 Moderate protein-calorie malnutrition: Secondary | ICD-10-CM

## 2018-08-18 DIAGNOSIS — Z89432 Acquired absence of left foot: Secondary | ICD-10-CM

## 2018-08-18 DIAGNOSIS — N183 Chronic kidney disease, stage 3 unspecified: Secondary | ICD-10-CM

## 2018-08-18 DIAGNOSIS — Z794 Long term (current) use of insulin: Secondary | ICD-10-CM

## 2018-08-18 NOTE — Progress Notes (Signed)
Office Visit Note   Patient: Kathy Frank           Date of Birth: 09-24-1975           MRN: [id number] Visit Date: 08/18/2018              Requested by: Lars Mage, MD [address] [place], [place] [address] PCP: Lars Mage, MD  Chief Complaint  Patient presents with  . Left Foot - Routine Post Op    08/09/18 left foot transmet amputation       HPI: The patient is a 43 year old woman who is seen for postoperative follow-up following a left transmetatarsal amputation on 08/09/2018.  She had a Praveena VAC on postoperatively and reported that it stopped working yesterday and she came in today with the dressing still attached but the tubing disconnected.  She is nonweightbearing utilizing crutches and a Darco shoe for ambulation.  Assessment & Plan: Visit Diagnoses:  1. S/P transmetatarsal amputation of foot, left ([place])   2. Type 2 diabetes mellitus with diabetic polyneuropathy, with long-term current use of insulin ([place])   3. CKD (chronic kidney disease) stage 3, GFR 30-59 ml/min (HCC)   4. Moderate protein malnutrition (HCC)     Plan: The Praveena VAC dressing was removed today.  She can wash the area and use Dial soap and water.  We discussed at length that she should not soak or submerge her foot under water but she can shower and clean it.  We discussed that she should not weight-bear at all.  She was provided an XL Vive stump shrinker stocking and this was applied and she was instructed to wear this except for showering.  We reviewed again that she should not weight-bear at all and that she should elevate as much as possible.  She will follow-up in 1 week.  Follow-Up Instructions: Return in about 1 week (around 08/25/2018).   Ortho Exam  Patient is alert, oriented, no adenopathy, well-dressed, normal affect, normal respiratory effort. The Praveena VAC dressing was removed and the incision is under tension and had moderate serosanguineous drainage.  There was edema of  the residual limb.  There was not significant odor or signs of infection.  She has good palpable pedal pulse.  Imaging: No results found.   Labs: Lab Results  Component Value Date   HGBA1C 6.5 (H) 08/06/2018   HGBA1C 7.9 (A) 02/04/2018   HGBA1C 9.0 (H) 09/13/2017   ESRSEDRATE 21 08/06/2018   ESRSEDRATE 85 (H) 02/02/2017   CRP <0.8 08/06/2018   CRP 42.4 (H) 02/02/2017   REPTSTATUS 07/13/2018 FINAL 07/08/2018   GRAMSTAIN  07/08/2018    RARE WBC PRESENT, PREDOMINANTLY PMN RARE GRAM POSITIVE COCCI IN PAIRS    CULT  07/08/2018    FEW STAPHYLOCOCCUS AUREUS FEW GROUP B STREP(S.AGALACTIAE)ISOLATED TESTING AGAINST S. AGALACTIAE NOT ROUTINELY PERFORMED DUE TO PREDICTABILITY OF AMP/PEN/VAN SUSCEPTIBILITY. NO ANAEROBES ISOLATED Performed at [facility], [place] [address], [place], [place] [address]    Joiner 07/08/2018     Lab Results  Component Value Date   ALBUMIN 2.7 (L) 08/11/2018   ALBUMIN 2.6 (L) 08/07/2018   ALBUMIN 3.0 (L) 08/06/2018    Body mass index is 27.79 kg/m.  Orders:  No orders of the defined types were placed in this encounter.  No orders of the defined types were placed in this encounter.    Procedures: No procedures performed  Clinical Data: No additional findings.  ROS:  All other  systems negative, except as noted in the HPI. Review of Systems  Objective: Vital Signs: Ht 5\' 5"  (1.651 m)   Wt 167 lb (75.8 kg)   BMI 27.79 kg/m   Specialty Comments:  No specialty comments available.  PMFS History: Patient Active Problem List   Diagnosis Date Noted  . Adjustment disorder with mixed anxiety and depressed mood 08/15/2018  . Wound dehiscence s/p L transmetatarsal amputation   . Wound infection s/p L transmetatarsal amputation   . CKD (chronic kidney disease), stage V ([place])   . Acute on chronic anemia 07/03/2018  . Fecal incontinence 02/04/2018  . Healthcare maintenance 09/17/2017  . Right leg swelling  02/26/2017  . Superficial ulcerative lesion ([place]) 02/02/2017  . Aortic atherosclerosis ([place]) 09/30/2016  . Diabetic foot ulcer associated with diabetes mellitus due to underlying condition ([place]) 07/08/2016  . Human immunodeficiency virus (HIV) disease ([place]) 04/23/2016  . Gastroparesis   . Moderate nonproliferative diabetic retinopathy of both eyes ([place]) 11/21/2014  . Esophageal reflux   . Microscopic hematuria 10/31/2014  . Essential hypertension 11/16/2013  . TOBACCO USER 06/28/2006  . Depression 06/28/2006  . Diabetes mellitus type 2 in obese [facility]) 06/28/1994   Past Medical History:  Diagnosis Date  . CKD (chronic kidney disease) stage 5, GFR less than 15 ml/min (HCC) 06/19/2016   06/30/16: Seen by Dr. Posey Pronto [Nephrology]. Suspect etiologies to include diabetes and hypertension though cannot exclude HIV-associated nephropathy. Advised her against from using excessive Goody powder.  . Contact dermatitis 04/21/2016   07/29/16: Skin biopsy performed by [facility] notable for acute spongiotic dermatitis consistent with contact dermatitis.  . Cyclical vomiting    THC induced???  . Depression 06/28/2006   Qualifier: Diagnosis of  By: Riccardo Dubin MD, [place]    . Diabetes mellitus type 2 in obese ([place]) 06/28/1994  . DKA (diabetic ketoacidoses) ([place]) 12/2014  . Erosive esophagitis   . Esophageal reflux   . Essential hypertension 11/16/2013  . Gastroparesis    ? diabetic  . GERD (gastroesophageal reflux disease)   . Human immunodeficiency virus (HIV) disease ([place]) 04/23/2016  . Hypertension   . Moderate nonproliferative diabetic retinopathy of both eyes ([place]) 11/21/2014   11/14/14: Noted on retinal imaging; needs follow-up imaging in 6 months  05/22/16: Noted on retinal imaging again; needs follow-up imaging in 6 months  . Normocytic anemia   . TOBACCO USER 06/28/2006   Qualifier: Diagnosis of  By: Riccardo Dubin MD, Sherren Mocha    . Type II diabetes mellitus ([place]) 1994   diagnosed around 50     Family History  Problem Relation Age of Onset  . Diabetes Mother   . Diabetes Brother   . Diabetes Daughter   . Diabetes Daughter   . Mental retardation Brother        died from PNA  . Diabetes Maternal Grandmother     Past Surgical History:  Procedure Laterality Date  . AMPUTATION Left 07/08/2018   Procedure: AMPUTATION FORTH RAY LEFT FOOT;  Surgeon: [name], MD;  Location: [facility];  Service: Orthopedics;  Laterality: Left;  . AMPUTATION Left 08/09/2018   Procedure: Left Transmetatarsal Amputation;  Surgeon: [name], MD;  Location: [facility];  Service: Orthopedics;  Laterality: Left;  . LOWER EXTREMITY ANGIOGRAPHY N/A 07/05/2018   Procedure: LOWER EXTREMITY ANGIOGRAPHY;  Surgeon: Serafina Mitchell, MD;  Location: [facility];  Service: Cardiovascular;  Laterality: N/A;  . PERIPHERAL VASCULAR BALLOON ANGIOPLASTY Left 07/05/2018   Procedure: PERIPHERAL VASCULAR BALLOON ANGIOPLASTY;  Surgeon: Serafina Mitchell, MD;  Location: [facility];  Service: Cardiovascular;  Laterality: Left;  SFA  . TUBAL LIGATION  2002   Social History   Occupational History    Employer: UNEMPLOYED  Tobacco Use  . Smoking status: Former Smoker    Packs/day: 0.10    Years: 10.00    Pack years: 1.00    Types: Cigarettes    Start date: 11/17/2013    Last attempt to quit: 09/09/2014    Years since quitting: 3.9  . Smokeless tobacco: Never Used  Substance and Sexual Activity  . Alcohol use: Yes    Comment: Occasionally.  . Drug use: Yes    Frequency: 4.0 times per week    Types: Marijuana  . Sexual activity: Not on file

## 2018-08-19 ENCOUNTER — Ambulatory Visit (INDEPENDENT_AMBULATORY_CARE_PROVIDER_SITE_OTHER): Payer: Medicaid Other | Admitting: Internal Medicine

## 2018-08-19 ENCOUNTER — Other Ambulatory Visit: Payer: Self-pay

## 2018-08-19 VITALS — BP 138/90 | HR 92 | Temp 98.6°F | Wt 152.8 lb

## 2018-08-19 DIAGNOSIS — N185 Chronic kidney disease, stage 5: Secondary | ICD-10-CM | POA: Diagnosis present

## 2018-08-19 DIAGNOSIS — I120 Hypertensive chronic kidney disease with stage 5 chronic kidney disease or end stage renal disease: Secondary | ICD-10-CM

## 2018-08-19 DIAGNOSIS — E1122 Type 2 diabetes mellitus with diabetic chronic kidney disease: Secondary | ICD-10-CM | POA: Diagnosis not present

## 2018-08-19 DIAGNOSIS — R110 Nausea: Secondary | ICD-10-CM

## 2018-08-19 DIAGNOSIS — B20 Human immunodeficiency virus [HIV] disease: Secondary | ICD-10-CM | POA: Diagnosis not present

## 2018-08-19 DIAGNOSIS — Z89422 Acquired absence of other left toe(s): Secondary | ICD-10-CM

## 2018-08-19 DIAGNOSIS — D649 Anemia, unspecified: Secondary | ICD-10-CM

## 2018-08-19 DIAGNOSIS — Z791 Long term (current) use of non-steroidal anti-inflammatories (NSAID): Secondary | ICD-10-CM | POA: Diagnosis not present

## 2018-08-19 DIAGNOSIS — T8744 Infection of amputation stump, left lower extremity: Secondary | ICD-10-CM | POA: Diagnosis not present

## 2018-08-19 DIAGNOSIS — R5383 Other fatigue: Secondary | ICD-10-CM | POA: Diagnosis not present

## 2018-08-19 DIAGNOSIS — L089 Local infection of the skin and subcutaneous tissue, unspecified: Secondary | ICD-10-CM

## 2018-08-19 DIAGNOSIS — E1159 Type 2 diabetes mellitus with other circulatory complications: Secondary | ICD-10-CM | POA: Diagnosis not present

## 2018-08-19 DIAGNOSIS — T148XXA Other injury of unspecified body region, initial encounter: Principal | ICD-10-CM

## 2018-08-19 LAB — BASIC METABOLIC PANEL
Anion gap: 11 (ref 5–15)
BUN: 32 mg/dL — ABNORMAL HIGH (ref 6–20)
CO2: 21 mmol/L — ABNORMAL LOW (ref 22–32)
Calcium: 9 mg/dL (ref 8.9–10.3)
Chloride: 106 mmol/L (ref 98–111)
Creatinine, Ser: 5.88 mg/dL — ABNORMAL HIGH (ref 0.44–1.00)
GFR calc Af Amer: 10 mL/min — ABNORMAL LOW (ref >60)
GFR calc non Af Amer: 8 mL/min — ABNORMAL LOW (ref >60)
Glucose, Bld: 203 mg/dL — ABNORMAL HIGH (ref 70–99)
Potassium: 4.4 mmol/L (ref 3.5–5.1)
Sodium: 138 mmol/L (ref 135–145)

## 2018-08-19 LAB — CBC
HCT: 28.7 % — ABNORMAL LOW (ref 36.0–46.0)
Hemoglobin: 9.2 g/dL — ABNORMAL LOW (ref 12.0–15.0)
MCH: 30.6 pg (ref 26.0–34.0)
MCHC: 32.1 g/dL (ref 30.0–36.0)
MCV: 95.3 fL (ref 80.0–100.0)
Platelets: 415 10*3/uL — ABNORMAL HIGH (ref 150–400)
RBC: 3.01 MIL/uL — ABNORMAL LOW (ref 3.87–5.11)
RDW: 15.7 % — ABNORMAL HIGH (ref 11.5–15.5)
WBC: 6.1 10*3/uL (ref 4.0–10.5)
nRBC: 0 % (ref 0.0–0.2)

## 2018-08-19 MED ORDER — ONDANSETRON HCL 4 MG PO TABS: 4.0000 mg | ORAL_TABLET | Freq: Every day | ORAL | 0 refills | Status: DC | PRN

## 2018-08-19 NOTE — Progress Notes (Signed)
CC: nausea  HPI:  [name] is a 43 y.o. with a PMH of T2DM, HTN, CKD stage 5, HIV presenting to clinic for hospital follow up and evaluation of nausea.  Patient hospitalized 08/06/18 - 08/12/2018 for wound dehiscence after left 4th digit amputation on left foot leading to osteomyelitis and is s/p transmetatarsal amputation by Dr. Sharol Given. She received vanc and cefepime prior to her surgery. She is supposed to be starting home health physical therapy. She was sent home with a wound vac which malfunctioned; she was seen in ortho clinic yesterday and per patient they stated good healing, no indication for further wound vac/antibiotics, and they recommended compression stocking with follow up in 1 wk.  CKD 5: patient with CKD 5 thought to be multifactorial in nature from HIV, T2DM, and chronic NSAID use. She does not required starting HD, and does not have access. She will f/u with vasc surgery for evaluation for graft placement later this month. She has continued to take B/C powders since discharge. Hgb on discharge was 7.1; last couple of days of hospitalization she received aracept infusion and 2x iron infusions. She denies signs of bleeding.  Nausea, fatigue: Patient endorses having low energy since discharge from hospital, poor appetite the last 2 days, and nausea the last 2 days. She had one episode of vomiting NBNB yesterday and has not eaten much since. She has been checking her CBGs and they range from 74-190; symptoms aren't worsened with lower CBG readings. She endorses some dizziness if she is walking for short distances with her crutches. She denies abdominal pain, melena, hematochezia, chest pain, shortness of breath, swelling; she has had adequate BMs. She has not taken any oxycodone for pain since being in the hospital but has been taking B/C powders.   Please see problem based Assessment and Plan for status of patients chronic conditions.  Past Medical History:  Diagnosis Date    . CKD (chronic kidney disease) stage 3, GFR 30-59 ml/min (HCC) 06/19/2016   06/30/16: Seen by Dr. Posey Pronto [Nephrology]. Suspect etiologies to include diabetes and hypertension though cannot exclude HIV-associated nephropathy. Advised her against from using excessive Goody powder.  . CKD (chronic kidney disease), stage III ([place])   . Contact dermatitis 04/21/2016   07/29/16: Skin biopsy performed by [facility] notable for acute spongiotic dermatitis consistent with contact dermatitis.  . Cyclical vomiting    THC induced???  . Depression   . Depression 06/28/2006   Qualifier: Diagnosis of  By: Riccardo Dubin MD, Sherren Mocha    . Diabetes mellitus type 2 in obese ([place]) 06/28/1994  . DKA (diabetic ketoacidoses) ([place]) 12/2014  . Erosive esophagitis   . Esophageal reflux   . Essential hypertension 11/16/2013  . Gastroparesis    ? diabetic  . GERD (gastroesophageal reflux disease)   . Human immunodeficiency virus (HIV) disease ([place]) dx'd 04/2016  . Human immunodeficiency virus (HIV) disease ([place]) 04/23/2016  . Hypertension   . Moderate nonproliferative diabetic retinopathy of both eyes ([place]) 11/21/2014   11/14/14: Noted on retinal imaging; needs follow-up imaging in 6 months  05/22/16: Noted on retinal imaging again; needs follow-up imaging in 6 months  . Normocytic anemia   . TOBACCO USER 06/28/2006   Qualifier: Diagnosis of  By: Riccardo Dubin MD, Sherren Mocha    . Type II diabetes mellitus ([place]) 1994   diagnosed around 1994    Review of Systems:   Per HPI  Physical Exam:  Vitals:   08/19/18 1018  BP: 134/89  Pulse: 95  Temp: 98.6 F (37 C)  TempSrc: Oral  SpO2: 100%  Weight: 152 lb 12.8 oz (69.3 kg)   GENERAL- alert, co-operative, chronically ill appearing, appears tired HEENT- Oral mucosa appears moist. CARDIAC- RRR, no murmurs, rubs or gallops. RESP- Moving equal volumes of air, and clear to auscultation bilaterally, no wheezes or crackles. ABDOMEN- Soft, nontender, bowel sounds  present. NEURO- CN 2-12 grossly intact. EXTREMITIES- pulse 2+ PT, symmetric. No LE edema. SKIN- Warm.  Assessment & Plan:   See Encounters Tab for problem based charting.   Patient discussed with Dr. Nilsa Nutting, MD Internal Medicine PGY-3

## 2018-08-19 NOTE — Assessment & Plan Note (Signed)
Patient afebrile, not toxic appearing. CBC w/o leukocytosis. Seen by ortho yesterday and seems to be recovering well so far, however is at high risk of wound dehiscence due to microscopic vasculopathy in setting of her diabetes.   Plan: --continue compression stocking --f/u with ortho in 1 wk

## 2018-08-19 NOTE — Assessment & Plan Note (Signed)
Patient s/p aranesp and 2x iron infusions during last hospitalization. Hgb 7.1 on discharge and up to 9 on recheck today. Denies signs of bleeding.  Plan: --f/u next month with nephro, will likely get started on aranesp infusions in the future

## 2018-08-19 NOTE — Assessment & Plan Note (Addendum)
Patient with signs concerning for uremic symptoms. She is able to tolerate fluid intake but has not had other PO intake in last day and a half. She has continued to take B/C powders.  Stat labs obtained in the clinic reveal Cr 5.8 with BUN of 32 (discharge values of 6.9/41); Hgb 9 from 7.1. This is reassuring.  Plan: --prn zofran --f/u in clinic early next week --advised to stop all NSAID use --advised to f/u earlier with clinic or ED if not tolerating PO intake or intractable vomiting

## 2018-08-19 NOTE — Progress Notes (Signed)
Internal Medicine Clinic Attending  Case discussed with Dr. Svalina  at the time of the visit.  We reviewed the resident's history and exam and pertinent patient test results.  I agree with the assessment, diagnosis, and plan of care documented in the resident's note.  

## 2018-08-19 NOTE — Patient Instructions (Signed)
For your nausea, you can take zofran one tablet every 8 hours if needed. I would like for you to come back early next week to check on you.  If you are not able to drink or eat anything substantial, or have uncontrollable vomiting, please either come to the clinic or go to the emergency room if it is over the weekend or after hours.   DO NOT take medicines like ibuprofen, B/C powders, Goodies powders, motrin, naproxen as these may worsen your kidney function.

## 2018-08-20 ENCOUNTER — Encounter (INDEPENDENT_AMBULATORY_CARE_PROVIDER_SITE_OTHER): Payer: Self-pay | Admitting: Physician Assistant

## 2018-08-20 NOTE — Discharge Summary (Signed)
Name: Kathy Frank MRN: [id number] DOB: Sep 02, 1975 43 y.o. PCP: Lars Mage, MD  Date of Admission: 08/06/2018  3:44 PM Date of Discharge: 08/12/2018 Attending Physician: Johnnette Gourd MD  Discharge Diagnosis: 1. Left Foot Wound Soft Tissue Infection  2. Chronic Kidney Disease Stage IV 3. Hyperkalemia 4. Anemia of Chronic Disease 5. Type 2 Diabetes 6. HIV 7. Opioid-induced constipation 8. Major Depressive Disorder 9. Left Breast Mass  Discharge Medications: Allergies as of 08/12/2018   No Known Allergies     Medication List    STOP taking these medications   diclofenac sodium 1 % Gel Commonly known as:  VOLTAREN   doxycycline 100 MG tablet Commonly known as:  VIBRA-TABS   oxyCODONE-acetaminophen 5-325 MG tablet Commonly known as:  PERCOCET/ROXICET   pantoprazole 40 MG tablet Commonly known as:  PROTONIX   sennosides-docusate sodium 8.6-50 MG tablet Commonly known as:  SENOKOT-S     TAKE these medications   amLODipine 10 MG tablet Commonly known as:  NORVASC Take 1 tablet (10 mg total) by mouth daily.   aspirin 81 MG EC tablet Take 1 tablet (81 mg total) by mouth daily.   citalopram 20 MG tablet Commonly known as:  CELEXA Take 1 tablet (20 mg total) by mouth daily.   Dolutegravir-Rilpivirine 50-25 MG Tabs Commonly known as:  JULUCA Take 1 tablet by mouth daily. Please take with largest meal of the day.   glucose blood test strip Commonly known as:  ACCU-CHEK GUIDE USE TO TEST BLOOD SUGAR 3 TIMES DAILY . Diagnosis code  E11.69, E66.9   Insulin Glargine 100 UNIT/ML Solostar Pen Commonly known as:  LANTUS Inject 15 Units into the skin daily at 10 pm. What changed:  when to take this   labetalol 100 MG tablet Commonly known as:  NORMODYNE TAKE 1 TABLET BY MOUTH 2 TIMES DAILY   liraglutide 18 MG/3ML Sopn Commonly known as:  VICTOZA Inject 0.2 mLs (1.2 mg total) into the skin daily.   multivitamin with minerals Tabs tablet Take 1 tablet by  mouth daily.   polyethylene glycol packet Commonly known as:  MIRALAX / GLYCOLAX Take 17 g by mouth daily.   senna 8.6 MG Tabs tablet Commonly known as:  SENOKOT Take 2 tablets (17.2 mg total) by mouth 2 (two) times daily.   silver sulfADIAZINE 1 % cream Commonly known as:  SILVADENE Apply 1 application topically daily.   sodium bicarbonate 650 MG tablet Take 1 tablet (650 mg total) by mouth 2 (two) times daily.   triamcinolone cream 0.1 % Commonly known as:  KENALOG Apply 1 application topically 2 (two) times daily. What changed:    when to take this  reasons to take this     ASK your doctor about these medications   oxyCODONE 5 MG immediate release tablet Commonly known as:  Oxy IR/ROXICODONE Take 1 tablet (5 mg total) by mouth every 6 (six) hours as needed for up to 7 days for moderate pain or severe pain. Ask about: Should I take this medication?       Disposition and follow-up:   KathyFrancile C Frank was discharged from [facility] in Stable condition.  At the hospital follow up visit please address:  1.  Please assess her wound site. She will have drained removed at her orthopedic surgery follow-up appointment in 1 week.   2.  Labs / imaging needed at time of follow-up: BMP, CBC  3.  Pending labs/ test needing follow-up: None  Follow-up  Appointments: Follow-up Information    [name], MD In 1 week.   Specialty:  Orthopedic Surgery Contact information: [place] [address] [phone]        [place]. Go to.   Why:  Hospital Follow-up appointment made for January 10th at 9:45 AM       [facility].   Specialty:  [place] Why:  A representative from [facility] will contact you to arrange start date and time for your therapy. Contact information: [address] [place] [address] [phone]           Hospital Course by problem list: 1.  Left Foot Wound Soft Tissue Infection: Kathy Frank was admitted with a left foot skin and soft tissue infection at the surgical site of a recent digit amputation. She was treated with vancomycin for several days and this medication was discontinued after she  Undergoing a transmetatarsal amputation. Her post-op pain was controlled with pain medications. She has a drain in place and will have this removed at her orthopedic follow-up visit in 1 week.  2. Chronic Kidney Disease Stage IV: She had vein mapping during prior hospitalization but no permanent access in place yet. During this admission, her creatinine rose and she developed hyperkalemia (treated per below). Nephrology was consulted and recommend against starting dialysis at this time. She has a follow-up arranged with Dr. Posey Pronto in 09/2018. Please ensure that she makes this appointment.   3. Hyperkalemia: Treated with kaexylate and Lasix. Quickly resolved after treatment. Please repeat BMP at discharge.  4. Anemia of Chronic Disease: Secondary to CKD. Hb were in the 7s post-op (previously in 8s). She was given aranesp during her admission. Please recheck CBC at follow-up.   5. Type 2 Diabetes: On Victoza and Lantus at home. Victoza was held during admission. She was continued on Lantus 7 units qhs and SSI. Blood sugars remain well controlled throughout her admission. Lantus and Victoza both restarted at discharge.  6. HIV: Continued home Dolutegravir-rilpivirine.  7. Opioid-induced constipation: Developed constipation from the opioids she was taking for post-op pain. She was treated and had a bowel movement prior to discharge. She will need to continue Miralax and Senokot as needed.   8. Major Depressive Disorder: She was restarted on celexa (previously prescribed). Please reassess her mood at follow-up visit.   9. Left Breast Mass: Palpated on initial exam. Please follow this up as an outpatient.  Discharge Vitals:   BP (!) 157/77   Pulse (!)  101   Temp 98.2 F (36.8 C) (Oral)   Resp 16   Ht 5\' 5"  (1.651 m)   Wt 76.1 kg   SpO2 (!) 87%   BMI 27.92 kg/m   Pertinent Labs, Studies, and Procedures:  BMP Latest Ref Rng & Units 08/19/2018 08/12/2018 08/11/2018  Glucose 70 - 99 mg/dL 203(H) 114(H) 142(H)  BUN 6 - 20 mg/dL 32(H) 41(H) 41(H)  Creatinine 0.44 - 1.00 mg/dL 5.88(H) 6.97(H) 6.91(H)  BUN/Creat Ratio 9 - 23 - - -  Sodium 135 - 145 mmol/L 138 134(L) 136  Potassium 3.5 - 5.1 mmol/L 4.4 4.7 4.9  Chloride 98 - 111 mmol/L 106 98 101  CO2 22 - 32 mmol/L 21(L) 22 24  Calcium 8.9 - 10.3 mg/dL 9.0 8.6(L) 8.6(L)   CBC Latest Ref Rng & Units 08/19/2018 08/12/2018 08/11/2018  WBC 4.0 - 10.5 K/uL 6.1 10.0 8.0  Hemoglobin 12.0 - 15.0 g/dL 9.2(L)  7.1(L) 7.3(L)  Hematocrit 36.0 - 46.0 % 28.7(L) 22.4(L) 23.1(L)  Platelets 150 - 400 K/uL 415(H) 239 238   11/24 MR Left Ankle: IMPRESSION: 1. Forefoot findings dictated separately. 2. No evidence of osteomyelitis within the left ankle, hindfoot or forefoot. 3. Generalized soft tissue edema, greatest laterally, likely cellulitis. No evidence of soft tissue abscess. 4. Intact ankle tendons and ligaments. Midfoot degenerative changes.  11/24 MR Left Foot: IMPRESSION: 1. Swelling of the 4th toe with suspected soft tissue emphysema and marrow changes suspicious for early osteomyelitis. 2. Generalized forefoot cellulitis without focal fluid collection. 3. Midfoot degenerative changes. 4. Ankle findings dictated separately.  11/28 Left Foot: FINDINGS: There is no radiographic evidence of osteomyelitis. Previous resection of the distal fourth metatarsal and fourth toe. There is a fairly sharp surgical margin in the fourth metatarsal. Small amount of gas in the soft tissues at the site of surgery. IMPRESSION: No radiographic evidence of osteomyelitis.  Discharge Instructions: Discharge Instructions    Call MD for:  redness, tenderness, or signs of infection (pain, swelling, redness, odor  or green/yellow discharge around incision site)   Complete by:  As directed    Call MD for:  temperature >100.4   Complete by:  As directed    Diet - low sodium heart healthy   Complete by:  As directed    Diet Carb Modified   Complete by:  As directed    Discharge instructions   Complete by:  As directed    Ms. Ronica, Vivian   You were admitted to the hospital due to a wound infection after your left foot surgery 1 month ago and underwent amputation of half of your left foot. It will be important that you do not put any weight on this foot while you recuperate. You have an appointment with Dr. Sharol Given in 1 week in his office. The wound vac in your left foot will stay on until you see him in 1 week.  For your pain, we sent a prescription for oxycodone 5 mg that you can use every 6 hours as needed for pain. We have also scheduled you an appointment in our clinic for next week. We will check your kidney function during this visit. You also have an appointment with your kidney doctors next month.   Please call us if you have any questions or concerns.   -Dr. Alfonse Spruce   Increase activity slowly   Complete by:  As directed       Signed: Carroll Sage, MD Pager: [phone]

## 2018-08-22 ENCOUNTER — Encounter: Payer: Self-pay | Admitting: Family

## 2018-08-22 ENCOUNTER — Ambulatory Visit (HOSPITAL_COMMUNITY): Payer: Medicaid Other | Attending: Family

## 2018-08-22 ENCOUNTER — Ambulatory Visit: Payer: Medicaid Other | Admitting: Family

## 2018-08-22 NOTE — Telephone Encounter (Signed)
Pt was seen by Dr Jari Favre on 08/19/18 ; see encounter.

## 2018-08-23 ENCOUNTER — Ambulatory Visit (INDEPENDENT_AMBULATORY_CARE_PROVIDER_SITE_OTHER): Payer: Medicaid Other | Admitting: Physician Assistant

## 2018-08-30 ENCOUNTER — Encounter (INDEPENDENT_AMBULATORY_CARE_PROVIDER_SITE_OTHER): Payer: Self-pay | Admitting: Physician Assistant

## 2018-08-30 ENCOUNTER — Ambulatory Visit (INDEPENDENT_AMBULATORY_CARE_PROVIDER_SITE_OTHER): Payer: Medicaid Other | Admitting: Physician Assistant

## 2018-08-30 VITALS — Ht 65.0 in | Wt 152.8 lb

## 2018-08-30 DIAGNOSIS — N183 Chronic kidney disease, stage 3 unspecified: Secondary | ICD-10-CM

## 2018-08-30 DIAGNOSIS — E1142 Type 2 diabetes mellitus with diabetic polyneuropathy: Secondary | ICD-10-CM

## 2018-08-30 DIAGNOSIS — Z794 Long term (current) use of insulin: Secondary | ICD-10-CM

## 2018-08-30 DIAGNOSIS — E440 Moderate protein-calorie malnutrition: Secondary | ICD-10-CM

## 2018-08-30 DIAGNOSIS — Z89432 Acquired absence of left foot: Secondary | ICD-10-CM

## 2018-08-30 NOTE — Progress Notes (Signed)
Office Visit Note   Patient: Kathy Frank           Date of Birth: [birth year]           MRN: [id number] Visit Date: 08/30/2018              Requested by: Lars Mage, MD [address] [place], [place] [address] PCP: Lars Mage, MD  Chief Complaint  Patient presents with  . Left Foot - Routine Post Op    S/p Left TMA      HPI: The patient is a 43 year old woman who is seen for postoperative follow-up following a left transmetatarsal amputation on 08/09/2018.  She previously had a Praveena VAC on and this was removed last visit.  She has been utilizing a Darco shoe and trying to maintain nonweightbearing as much as possible.  She is using an extra-large stump shrinker stocking to control edema.  She has been washing the foot daily with Dial soap and water.  She reports she is getting much more comfortable with the amputation site now.  Assessment & Plan: Visit Diagnoses:  1. S/P transmetatarsal amputation of foot, left ([place])   2. Type 2 diabetes mellitus with diabetic polyneuropathy, with long-term current use of insulin ([place])   3. CKD (chronic kidney disease) stage 3, GFR 30-59 ml/min (HCC)   4. Moderate protein malnutrition (HCC)     Plan: Continue daily washing with Dial soap and water and apply the extra-large stump shrinker stocking.  We will leave the sutures in place for at least another week.  Continue to offload and elevate as much as possible.  Follow-up in 1 week.  Follow-Up Instructions: Return in about 1 week (around 09/06/2018).   Ortho Exam  Patient is alert, oriented, no adenopathy, well-dressed, normal affect, normal respiratory effort. The left foot transmetatarsal amputation site has mild edema and scant serous drainage from the incisional area.  Sutures remain under mild tension.  No current signs of infection.  Excellent dorsalis pedis pulse.  Imaging: No results found.   Labs: Lab Results  Component Value Date   HGBA1C 6.5 (H) 08/06/2018   HGBA1C 7.9 (A) 02/04/2018   HGBA1C 9.0 (H) 09/13/2017   ESRSEDRATE 21 08/06/2018   ESRSEDRATE 85 (H) 02/02/2017   CRP <0.8 08/06/2018   CRP 42.4 (H) 02/02/2017   REPTSTATUS 07/13/2018 FINAL 07/08/2018   GRAMSTAIN  07/08/2018    RARE WBC PRESENT, PREDOMINANTLY PMN RARE GRAM POSITIVE COCCI IN PAIRS    CULT  07/08/2018    FEW STAPHYLOCOCCUS AUREUS FEW GROUP B STREP(S.AGALACTIAE)ISOLATED TESTING AGAINST S. AGALACTIAE NOT ROUTINELY PERFORMED DUE TO PREDICTABILITY OF AMP/PEN/VAN SUSCEPTIBILITY. NO ANAEROBES ISOLATED Performed at [facility], [place] [address] [place], [place] [address]    [place] 07/08/2018     Lab Results  Component Value Date   ALBUMIN 2.7 (L) 08/11/2018   ALBUMIN 2.6 (L) 08/07/2018   ALBUMIN 3.0 (L) 08/06/2018    Body mass index is 25.43 kg/m.  Orders:  No orders of the defined types were placed in this encounter.  No orders of the defined types were placed in this encounter.    Procedures: No procedures performed  Clinical Data: No additional findings.  ROS:  All other systems negative, except as noted in the HPI. Review of Systems  Objective: Vital Signs: Ht 5\' 5"  (1.651 m)   Wt 152 lb 12.8 oz (69.3 kg)   BMI 25.43 kg/m   Specialty Comments:  No specialty comments available.  PMFS History: Patient Active Problem List   Diagnosis Date Noted  . Adjustment disorder with mixed anxiety and depressed mood 08/15/2018  . Wound dehiscence s/p L transmetatarsal amputation   . Wound infection s/p L transmetatarsal amputation   . CKD (chronic kidney disease), stage V ([place])   . Acute on chronic anemia 07/03/2018  . Fecal incontinence 02/04/2018  . Healthcare maintenance 09/17/2017  . Right leg swelling 02/26/2017  . Superficial ulcerative lesion ([place]) 02/02/2017  . Aortic atherosclerosis ([place]) 09/30/2016  . Diabetic foot ulcer associated with diabetes mellitus due to underlying condition ([place]) 07/08/2016  . Human  immunodeficiency virus (HIV) disease ([place]) 04/23/2016  . Gastroparesis   . Moderate nonproliferative diabetic retinopathy of both eyes ([place]) 11/21/2014  . Esophageal reflux   . Microscopic hematuria 10/31/2014  . Essential hypertension 11/16/2013  . TOBACCO USER 06/28/2006  . Depression 06/28/2006  . Diabetes mellitus type 2 in obese [facility]) 06/28/1994   Past Medical History:  Diagnosis Date  . CKD (chronic kidney disease) stage 5, GFR less than 15 ml/min (HCC) 06/19/2016   06/30/16: Seen by Dr. Posey Pronto [Nephrology]. Suspect etiologies to include diabetes and hypertension though cannot exclude HIV-associated nephropathy. Advised her against from using excessive Goody powder.  . Contact dermatitis 04/21/2016   07/29/16: Skin biopsy performed by [facility] notable for acute spongiotic dermatitis consistent with contact dermatitis.  . Cyclical vomiting    THC induced???  . Depression 06/28/2006   Qualifier: Diagnosis of  By: Riccardo Dubin MD, Todd    . Diabetes mellitus type 2 in obese ([place]) 06/28/1994  . DKA (diabetic ketoacidoses) ([place]) 12/2014  . Erosive esophagitis   . Esophageal reflux   . Essential hypertension 11/16/2013  . Gastroparesis    ? diabetic  . GERD (gastroesophageal reflux disease)   . Human immunodeficiency virus (HIV) disease ([place]) 04/23/2016  . Hypertension   . Moderate nonproliferative diabetic retinopathy of both eyes ([place]) 11/21/2014   11/14/14: Noted on retinal imaging; needs follow-up imaging in 6 months  05/22/16: Noted on retinal imaging again; needs follow-up imaging in 6 months  . Normocytic anemia   . TOBACCO USER 06/28/2006   Qualifier: Diagnosis of  By: Riccardo Dubin MD, Sherren Mocha    . Type II diabetes mellitus ([place]) 1994   diagnosed around 84    Family History  Problem Relation Age of Onset  . Diabetes Mother   . Diabetes Brother   . Diabetes Daughter   . Diabetes Daughter   . Mental retardation Brother        died from PNA  . Diabetes Maternal  Grandmother     Past Surgical History:  Procedure Laterality Date  . AMPUTATION Left 07/08/2018   Procedure: AMPUTATION FORTH RAY LEFT FOOT;  Surgeon: [name], MD;  Location: [facility];  Service: Orthopedics;  Laterality: Left;  . AMPUTATION Left 08/09/2018   Procedure: Left Transmetatarsal Amputation;  Surgeon: [name], MD;  Location: [facility];  Service: Orthopedics;  Laterality: Left;  . LOWER EXTREMITY ANGIOGRAPHY N/A 07/05/2018   Procedure: LOWER EXTREMITY ANGIOGRAPHY;  Surgeon: Serafina Mitchell, MD;  Location: [facility];  Service: Cardiovascular;  Laterality: N/A;  . PERIPHERAL VASCULAR BALLOON ANGIOPLASTY Left 07/05/2018   Procedure: PERIPHERAL VASCULAR BALLOON ANGIOPLASTY;  Surgeon: Serafina Mitchell, MD;  Location: [facility];  Service: Cardiovascular;  Laterality: Left;  SFA  . TUBAL LIGATION  2002   Social History   Occupational History    Employer: UNEMPLOYED  Tobacco Use  .  Smoking status: Former Smoker    Packs/day: 0.10    Years: 10.00    Pack years: 1.00    Types: Cigarettes    Start date: 11/17/2013    Last attempt to quit: 09/09/2014    Years since quitting: 3.9  . Smokeless tobacco: Never Used  Substance and Sexual Activity  . Alcohol use: Yes    Comment: Occasionally.  . Drug use: Yes    Frequency: 4.0 times per week    Types: Marijuana  . Sexual activity: Not on file

## 2018-09-05 ENCOUNTER — Ambulatory Visit: Payer: Medicaid Other

## 2018-09-05 ENCOUNTER — Ambulatory Visit: Payer: Medicaid Other | Admitting: Pharmacist

## 2018-09-09 ENCOUNTER — Encounter: Payer: Self-pay | Admitting: Internal Medicine

## 2018-09-09 ENCOUNTER — Ambulatory Visit (INDEPENDENT_AMBULATORY_CARE_PROVIDER_SITE_OTHER): Payer: Medicaid Other | Admitting: Physician Assistant

## 2018-09-09 ENCOUNTER — Ambulatory Visit: Payer: Medicaid Other

## 2018-09-15 ENCOUNTER — Encounter (INDEPENDENT_AMBULATORY_CARE_PROVIDER_SITE_OTHER): Payer: Self-pay | Admitting: Physician Assistant

## 2018-09-15 ENCOUNTER — Ambulatory Visit (INDEPENDENT_AMBULATORY_CARE_PROVIDER_SITE_OTHER): Payer: Medicaid Other | Admitting: Physician Assistant

## 2018-09-15 VITALS — Ht 65.0 in | Wt 152.0 lb

## 2018-09-15 DIAGNOSIS — E440 Moderate protein-calorie malnutrition: Secondary | ICD-10-CM

## 2018-09-15 DIAGNOSIS — E1142 Type 2 diabetes mellitus with diabetic polyneuropathy: Secondary | ICD-10-CM

## 2018-09-15 DIAGNOSIS — N183 Chronic kidney disease, stage 3 unspecified: Secondary | ICD-10-CM

## 2018-09-15 DIAGNOSIS — Z794 Long term (current) use of insulin: Secondary | ICD-10-CM

## 2018-09-15 DIAGNOSIS — Z89432 Acquired absence of left foot: Secondary | ICD-10-CM

## 2018-09-15 MED ORDER — SULFAMETHOXAZOLE-TRIMETHOPRIM 800-160 MG PO TABS: 1.0000 | ORAL_TABLET | Freq: Two times a day (BID) | ORAL | 0 refills | Status: DC

## 2018-09-15 NOTE — Progress Notes (Signed)
Office Visit Note   Patient: Kathy Frank           Date of Birth: March 23, 1976           MRN: [id number] Visit Date: 09/15/2018              Requested by: Lars Mage, MD [address] [place], [place] [address] PCP: Lars Mage, MD  Chief Complaint  Patient presents with  . Left Foot - Routine Post Op    08/09/18 left transmet amputation       HPI: The patient is a 43 yo woman for post operative follow up following a left transmetatarsal amputation on 08/09/2018. She has been walking in a Darco shoe and notes the incision has opened and she is draining some brownish drainage. She reports she is washing the foot daily in the shower.   Assessment & Plan: Visit Diagnoses:  1. S/P transmetatarsal amputation of foot, left ([place])   2. Type 2 diabetes mellitus with diabetic polyneuropathy, with long-term current use of insulin ([place])   3. CKD (chronic kidney disease) stage 3, GFR 30-59 ml/min (HCC)   4. Moderate protein malnutrition ([place])     Plan: Most of the sutures were removed today. She was instructed to wash the foot daily with Dial soap and water and apply iodosorb ointment and then a XXL stump shrinker stocking to the foot daily. Will also start Septra DS 1 po BID. Discussed she may require revision surgery if not improving over the next week. Follow up early next week.   Follow-Up Instructions: Return in about 5 days (around 09/20/2018).   Ortho Exam  Patient is alert, oriented, no adenopathy, well-dressed, normal affect, normal respiratory effort. The incision is opening and there is fibrous tissue and serosanguinous drainage after cleaning with gauze. There is moderate edema of the foot. Palpable dorsalis pedis pulse.   Imaging: No results found.   Labs: Lab Results  Component Value Date   HGBA1C 6.5 (H) 08/06/2018   HGBA1C 7.9 (A) 02/04/2018   HGBA1C 9.0 (H) 09/13/2017   ESRSEDRATE 21 08/06/2018   ESRSEDRATE 85 (H) 02/02/2017   CRP <0.8 08/06/2018   CRP 42.4  (H) 02/02/2017   REPTSTATUS 07/13/2018 FINAL 07/08/2018   GRAMSTAIN  07/08/2018    RARE WBC PRESENT, PREDOMINANTLY PMN RARE GRAM POSITIVE COCCI IN PAIRS    CULT  07/08/2018    FEW STAPHYLOCOCCUS AUREUS FEW GROUP B STREP(S.AGALACTIAE)ISOLATED TESTING AGAINST S. AGALACTIAE NOT ROUTINELY PERFORMED DUE TO PREDICTABILITY OF AMP/PEN/VAN SUSCEPTIBILITY. NO ANAEROBES ISOLATED Performed at [facility], [place] [address], [place], [place] [address]    [place] 07/08/2018     Lab Results  Component Value Date   ALBUMIN 2.7 (L) 08/11/2018   ALBUMIN 2.6 (L) 08/07/2018   ALBUMIN 3.0 (L) 08/06/2018    Body mass index is 25.29 kg/m.  Orders:  No orders of the defined types were placed in this encounter.  Meds ordered this encounter  Medications  . sulfamethoxazole-trimethoprim (BACTRIM DS,SEPTRA DS) 800-160 MG tablet    Sig: Take 1 tablet by mouth 2 (two) times daily.    Dispense:  20 tablet    Refill:  0     Procedures: No procedures performed  Clinical Data: No additional findings.  ROS:  All other systems negative, except as noted in the HPI. Review of Systems  Objective: Vital Signs: Ht 5\' 5"  (1.651 m)   Wt 152 lb (68.9 kg)   BMI 25.29 kg/m  Specialty Comments:  No specialty comments available.  PMFS History: Patient Active Problem List   Diagnosis Date Noted  . Adjustment disorder with mixed anxiety and depressed mood 08/15/2018  . Wound dehiscence s/p L transmetatarsal amputation   . Wound infection s/p L transmetatarsal amputation   . CKD (chronic kidney disease), stage V ([place])   . Acute on chronic anemia 07/03/2018  . Fecal incontinence 02/04/2018  . Healthcare maintenance 09/17/2017  . Right leg swelling 02/26/2017  . Superficial ulcerative lesion ([place]) 02/02/2017  . Aortic atherosclerosis ([place]) 09/30/2016  . Diabetic foot ulcer associated with diabetes mellitus due to underlying condition ([place]) 07/08/2016  . Human  immunodeficiency virus (HIV) disease ([place]) 04/23/2016  . Gastroparesis   . Moderate nonproliferative diabetic retinopathy of both eyes ([place]) 11/21/2014  . Esophageal reflux   . Microscopic hematuria 10/31/2014  . Essential hypertension 11/16/2013  . TOBACCO USER 06/28/2006  . Depression 06/28/2006  . Diabetes mellitus type 2 in obese [facility]) 06/28/1994   Past Medical History:  Diagnosis Date  . CKD (chronic kidney disease) stage 5, GFR less than 15 ml/min (HCC) 06/19/2016   06/30/16: Seen by Dr. Posey Pronto [Nephrology]. Suspect etiologies to include diabetes and hypertension though cannot exclude HIV-associated nephropathy. Advised her against from using excessive Goody powder.  . Contact dermatitis 04/21/2016   07/29/16: Skin biopsy performed by [facility] notable for acute spongiotic dermatitis consistent with contact dermatitis.  . Cyclical vomiting    THC induced???  . Depression 06/28/2006   Qualifier: Diagnosis of  By: Riccardo Dubin MD, Todd    . Diabetes mellitus type 2 in obese ([place]) 06/28/1994  . DKA (diabetic ketoacidoses) ([place]) 12/2014  . Erosive esophagitis   . Esophageal reflux   . Essential hypertension 11/16/2013  . Gastroparesis    ? diabetic  . GERD (gastroesophageal reflux disease)   . Human immunodeficiency virus (HIV) disease ([place]) 04/23/2016  . Hypertension   . Moderate nonproliferative diabetic retinopathy of both eyes ([place]) 11/21/2014   11/14/14: Noted on retinal imaging; needs follow-up imaging in 6 months  05/22/16: Noted on retinal imaging again; needs follow-up imaging in 6 months  . Normocytic anemia   . TOBACCO USER 06/28/2006   Qualifier: Diagnosis of  By: Riccardo Dubin MD, Sherren Mocha    . Type II diabetes mellitus ([place]) 1994   diagnosed around 58    Family History  Problem Relation Age of Onset  . Diabetes Mother   . Diabetes Brother   . Diabetes Daughter   . Diabetes Daughter   . Mental retardation Brother        died from PNA  . Diabetes Maternal  Grandmother     Past Surgical History:  Procedure Laterality Date  . AMPUTATION Left 07/08/2018   Procedure: AMPUTATION FORTH RAY LEFT FOOT;  Surgeon: [name], MD;  Location: [facility];  Service: Orthopedics;  Laterality: Left;  . AMPUTATION Left 08/09/2018   Procedure: Left Transmetatarsal Amputation;  Surgeon: [name], MD;  Location: [facility];  Service: Orthopedics;  Laterality: Left;  . LOWER EXTREMITY ANGIOGRAPHY N/A 07/05/2018   Procedure: LOWER EXTREMITY ANGIOGRAPHY;  Surgeon: Serafina Mitchell, MD;  Location: [facility];  Service: Cardiovascular;  Laterality: N/A;  . PERIPHERAL VASCULAR BALLOON ANGIOPLASTY Left 07/05/2018   Procedure: PERIPHERAL VASCULAR BALLOON ANGIOPLASTY;  Surgeon: Serafina Mitchell, MD;  Location: [facility];  Service: Cardiovascular;  Laterality: Left;  SFA  . TUBAL LIGATION  2002   Social History   Occupational History  Employer: UNEMPLOYED  Tobacco Use  . Smoking status: Former Smoker    Packs/day: 0.10    Years: 10.00    Pack years: 1.00    Types: Cigarettes    Start date: 11/17/2013    Last attempt to quit: 09/09/2014    Years since quitting: 4.0  . Smokeless tobacco: Never Used  Substance and Sexual Activity  . Alcohol use: Yes    Comment: Occasionally.  . Drug use: Yes    Frequency: 4.0 times per week    Types: Marijuana  . Sexual activity: Not on file

## 2018-09-21 ENCOUNTER — Ambulatory Visit (INDEPENDENT_AMBULATORY_CARE_PROVIDER_SITE_OTHER): Payer: Medicaid Other | Admitting: Orthopedic Surgery

## 2018-09-27 ENCOUNTER — Ambulatory Visit (INDEPENDENT_AMBULATORY_CARE_PROVIDER_SITE_OTHER): Payer: Medicaid Other | Admitting: Orthopedic Surgery

## 2018-09-28 ENCOUNTER — Other Ambulatory Visit (HOSPITAL_COMMUNITY): Payer: Self-pay | Admitting: Internal Medicine

## 2018-10-06 ENCOUNTER — Encounter (HOSPITAL_COMMUNITY): Payer: Self-pay | Admitting: Emergency Medicine

## 2018-10-06 ENCOUNTER — Inpatient Hospital Stay (HOSPITAL_COMMUNITY)
Admission: EM | Admit: 2018-10-06 | Discharge: 2018-10-16 | DRG: 857 | Disposition: A | Discharge status: Home / Self Care | Payer: Medicaid Other | Attending: Oncology | Admitting: Oncology

## 2018-10-06 DIAGNOSIS — Z6826 Body mass index (BMI) 26.0-26.9, adult: Secondary | ICD-10-CM

## 2018-10-06 DIAGNOSIS — Z89432 Acquired absence of left foot: Secondary | ICD-10-CM

## 2018-10-06 DIAGNOSIS — T796XXA Traumatic ischemia of muscle, initial encounter: Secondary | ICD-10-CM | POA: Diagnosis not present

## 2018-10-06 DIAGNOSIS — E669 Obesity, unspecified: Secondary | ICD-10-CM | POA: Diagnosis present

## 2018-10-06 DIAGNOSIS — Z560 Unemployment, unspecified: Secondary | ICD-10-CM

## 2018-10-06 DIAGNOSIS — M62838 Other muscle spasm: Secondary | ICD-10-CM | POA: Diagnosis not present

## 2018-10-06 DIAGNOSIS — E872 Acidosis: Secondary | ICD-10-CM | POA: Diagnosis present

## 2018-10-06 DIAGNOSIS — T8781 Dehiscence of amputation stump: Secondary | ICD-10-CM

## 2018-10-06 DIAGNOSIS — Z79899 Other long term (current) drug therapy: Secondary | ICD-10-CM

## 2018-10-06 DIAGNOSIS — D62 Acute posthemorrhagic anemia: Secondary | ICD-10-CM | POA: Diagnosis not present

## 2018-10-06 DIAGNOSIS — E1151 Type 2 diabetes mellitus with diabetic peripheral angiopathy without gangrene: Secondary | ICD-10-CM | POA: Diagnosis present

## 2018-10-06 DIAGNOSIS — T148XXA Other injury of unspecified body region, initial encounter: Secondary | ICD-10-CM

## 2018-10-06 DIAGNOSIS — F329 Major depressive disorder, single episode, unspecified: Secondary | ICD-10-CM | POA: Diagnosis present

## 2018-10-06 DIAGNOSIS — E11621 Type 2 diabetes mellitus with foot ulcer: Secondary | ICD-10-CM | POA: Diagnosis present

## 2018-10-06 DIAGNOSIS — E119 Type 2 diabetes mellitus without complications: Secondary | ICD-10-CM | POA: Diagnosis present

## 2018-10-06 DIAGNOSIS — B20 Human immunodeficiency virus [HIV] disease: Secondary | ICD-10-CM | POA: Diagnosis present

## 2018-10-06 DIAGNOSIS — D631 Anemia in chronic kidney disease: Secondary | ICD-10-CM | POA: Diagnosis present

## 2018-10-06 DIAGNOSIS — Z9851 Tubal ligation status: Secondary | ICD-10-CM

## 2018-10-06 DIAGNOSIS — Z7982 Long term (current) use of aspirin: Secondary | ICD-10-CM

## 2018-10-06 DIAGNOSIS — L089 Local infection of the skin and subcutaneous tissue, unspecified: Secondary | ICD-10-CM | POA: Diagnosis present

## 2018-10-06 DIAGNOSIS — E1140 Type 2 diabetes mellitus with diabetic neuropathy, unspecified: Secondary | ICD-10-CM | POA: Diagnosis present

## 2018-10-06 DIAGNOSIS — E440 Moderate protein-calorie malnutrition: Secondary | ICD-10-CM

## 2018-10-06 DIAGNOSIS — N2581 Secondary hyperparathyroidism of renal origin: Secondary | ICD-10-CM | POA: Diagnosis present

## 2018-10-06 DIAGNOSIS — E1165 Type 2 diabetes mellitus with hyperglycemia: Secondary | ICD-10-CM | POA: Diagnosis present

## 2018-10-06 DIAGNOSIS — Z794 Long term (current) use of insulin: Secondary | ICD-10-CM

## 2018-10-06 DIAGNOSIS — E113393 Type 2 diabetes mellitus with moderate nonproliferative diabetic retinopathy without macular edema, bilateral: Secondary | ICD-10-CM | POA: Diagnosis present

## 2018-10-06 DIAGNOSIS — L97529 Non-pressure chronic ulcer of other part of left foot with unspecified severity: Secondary | ICD-10-CM | POA: Diagnosis present

## 2018-10-06 DIAGNOSIS — Z23 Encounter for immunization: Secondary | ICD-10-CM

## 2018-10-06 DIAGNOSIS — I120 Hypertensive chronic kidney disease with stage 5 chronic kidney disease or end stage renal disease: Secondary | ICD-10-CM | POA: Diagnosis present

## 2018-10-06 DIAGNOSIS — W19XXXA Unspecified fall, initial encounter: Secondary | ICD-10-CM | POA: Diagnosis not present

## 2018-10-06 DIAGNOSIS — E1169 Type 2 diabetes mellitus with other specified complication: Secondary | ICD-10-CM | POA: Diagnosis present

## 2018-10-06 DIAGNOSIS — E875 Hyperkalemia: Secondary | ICD-10-CM | POA: Diagnosis present

## 2018-10-06 DIAGNOSIS — I10 Essential (primary) hypertension: Secondary | ICD-10-CM | POA: Diagnosis present

## 2018-10-06 DIAGNOSIS — T8149XA Infection following a procedure, other surgical site, initial encounter: Principal | ICD-10-CM | POA: Diagnosis present

## 2018-10-06 DIAGNOSIS — M869 Osteomyelitis, unspecified: Secondary | ICD-10-CM | POA: Diagnosis present

## 2018-10-06 DIAGNOSIS — Z833 Family history of diabetes mellitus: Secondary | ICD-10-CM

## 2018-10-06 DIAGNOSIS — N186 End stage renal disease: Secondary | ICD-10-CM | POA: Diagnosis present

## 2018-10-06 DIAGNOSIS — N185 Chronic kidney disease, stage 5: Secondary | ICD-10-CM | POA: Diagnosis present

## 2018-10-06 DIAGNOSIS — Z87891 Personal history of nicotine dependence: Secondary | ICD-10-CM

## 2018-10-06 DIAGNOSIS — E1122 Type 2 diabetes mellitus with diabetic chronic kidney disease: Secondary | ICD-10-CM | POA: Diagnosis present

## 2018-10-06 LAB — COMPREHENSIVE METABOLIC PANEL
ALT: 8 U/L (ref 0–44)
AST: 11 U/L — ABNORMAL LOW (ref 15–41)
Albumin: 3.2 g/dL — ABNORMAL LOW (ref 3.5–5.0)
Alkaline Phosphatase: 101 U/L (ref 38–126)
Anion gap: 9 (ref 5–15)
BUN: 53 mg/dL — ABNORMAL HIGH (ref 6–20)
CO2: 15 mmol/L — ABNORMAL LOW (ref 22–32)
Calcium: 7.5 mg/dL — ABNORMAL LOW (ref 8.9–10.3)
Chloride: 114 mmol/L — ABNORMAL HIGH (ref 98–111)
Creatinine, Ser: 6.71 mg/dL — ABNORMAL HIGH (ref 0.44–1.00)
GFR calc Af Amer: 8 mL/min — ABNORMAL LOW (ref >60)
GFR calc non Af Amer: 7 mL/min — ABNORMAL LOW (ref >60)
Glucose, Bld: 267 mg/dL — ABNORMAL HIGH (ref 70–99)
Potassium: 4.9 mmol/L (ref 3.5–5.1)
Sodium: 138 mmol/L (ref 135–145)
Total Bilirubin: 0.1 mg/dL — ABNORMAL LOW (ref 0.3–1.2)
Total Protein: 7.5 g/dL (ref 6.5–8.1)

## 2018-10-06 LAB — CBC WITH DIFFERENTIAL/PLATELET
Abs Immature Granulocytes: 0.02 10*3/uL (ref 0.00–0.07)
Basophils Absolute: 0.1 10*3/uL (ref 0.0–0.1)
Basophils Relative: 1 %
Eosinophils Absolute: 0.5 10*3/uL (ref 0.0–0.5)
Eosinophils Relative: 6 %
HCT: 26.7 % — ABNORMAL LOW (ref 36.0–46.0)
Hemoglobin: 8 g/dL — ABNORMAL LOW (ref 12.0–15.0)
Immature Granulocytes: 0 %
Lymphocytes Relative: 22 %
Lymphs Abs: 1.7 10*3/uL (ref 0.7–4.0)
MCH: 28.3 pg (ref 26.0–34.0)
MCHC: 30 g/dL (ref 30.0–36.0)
MCV: 94.3 fL (ref 80.0–100.0)
Monocytes Absolute: 0.6 10*3/uL (ref 0.1–1.0)
Monocytes Relative: 7 %
Neutro Abs: 4.8 10*3/uL (ref 1.7–7.7)
Neutrophils Relative %: 64 %
Platelets: 278 10*3/uL (ref 150–400)
RBC: 2.83 MIL/uL — ABNORMAL LOW (ref 3.87–5.11)
RDW: 15.6 % — ABNORMAL HIGH (ref 11.5–15.5)
WBC: 7.6 10*3/uL (ref 4.0–10.5)
nRBC: 0 % (ref 0.0–0.2)

## 2018-10-06 MED ORDER — SODIUM CHLORIDE 0.9% FLUSH
3.0000 mL | Freq: Once | INTRAVENOUS | Status: AC
  Administered 2018-10-07: 3 mL via INTRAVENOUS

## 2018-10-06 NOTE — ED Triage Notes (Signed)
  Patient comes in with L foot pain and wound infection.  Patient had her toes amputated off L foot in December.  Patient followed up with her doctor two weeks ago and was told they were infected and given antibiotic.  Wound is open and draining greenish pus, has very foul smelling odor.  Patient was supposed to follow up with her doctor but had a death in the family.  No fevers.  Hx diabetes, HIV +, CKD.  Pain 8/10.

## 2018-10-07 ENCOUNTER — Ambulatory Visit (INDEPENDENT_AMBULATORY_CARE_PROVIDER_SITE_OTHER): Payer: Self-pay | Admitting: Physician Assistant

## 2018-10-07 ENCOUNTER — Other Ambulatory Visit: Payer: Self-pay

## 2018-10-07 ENCOUNTER — Emergency Department (HOSPITAL_COMMUNITY): Payer: Medicaid Other

## 2018-10-07 DIAGNOSIS — E1122 Type 2 diabetes mellitus with diabetic chronic kidney disease: Secondary | ICD-10-CM

## 2018-10-07 DIAGNOSIS — I10 Essential (primary) hypertension: Secondary | ICD-10-CM | POA: Diagnosis not present

## 2018-10-07 DIAGNOSIS — Z01810 Encounter for preprocedural cardiovascular examination: Secondary | ICD-10-CM | POA: Diagnosis not present

## 2018-10-07 DIAGNOSIS — Z79899 Other long term (current) drug therapy: Secondary | ICD-10-CM | POA: Diagnosis not present

## 2018-10-07 DIAGNOSIS — E113393 Type 2 diabetes mellitus with moderate nonproliferative diabetic retinopathy without macular edema, bilateral: Secondary | ICD-10-CM | POA: Diagnosis present

## 2018-10-07 DIAGNOSIS — L089 Local infection of the skin and subcutaneous tissue, unspecified: Secondary | ICD-10-CM | POA: Diagnosis not present

## 2018-10-07 DIAGNOSIS — Z794 Long term (current) use of insulin: Secondary | ICD-10-CM

## 2018-10-07 DIAGNOSIS — E11628 Type 2 diabetes mellitus with other skin complications: Secondary | ICD-10-CM | POA: Diagnosis not present

## 2018-10-07 DIAGNOSIS — Z978 Presence of other specified devices: Secondary | ICD-10-CM | POA: Diagnosis not present

## 2018-10-07 DIAGNOSIS — M869 Osteomyelitis, unspecified: Secondary | ICD-10-CM | POA: Diagnosis present

## 2018-10-07 DIAGNOSIS — D62 Acute posthemorrhagic anemia: Secondary | ICD-10-CM | POA: Diagnosis not present

## 2018-10-07 DIAGNOSIS — I770 Arteriovenous fistula, acquired: Secondary | ICD-10-CM | POA: Diagnosis not present

## 2018-10-07 DIAGNOSIS — T8149XA Infection following a procedure, other surgical site, initial encounter: Secondary | ICD-10-CM | POA: Diagnosis present

## 2018-10-07 DIAGNOSIS — I120 Hypertensive chronic kidney disease with stage 5 chronic kidney disease or end stage renal disease: Secondary | ICD-10-CM | POA: Diagnosis not present

## 2018-10-07 DIAGNOSIS — E669 Obesity, unspecified: Secondary | ICD-10-CM | POA: Diagnosis not present

## 2018-10-07 DIAGNOSIS — M62838 Other muscle spasm: Secondary | ICD-10-CM | POA: Diagnosis not present

## 2018-10-07 DIAGNOSIS — T8781 Dehiscence of amputation stump: Secondary | ICD-10-CM | POA: Diagnosis not present

## 2018-10-07 DIAGNOSIS — T796XXA Traumatic ischemia of muscle, initial encounter: Secondary | ICD-10-CM | POA: Diagnosis not present

## 2018-10-07 DIAGNOSIS — Z23 Encounter for immunization: Secondary | ICD-10-CM | POA: Diagnosis not present

## 2018-10-07 DIAGNOSIS — Z87891 Personal history of nicotine dependence: Secondary | ICD-10-CM

## 2018-10-07 DIAGNOSIS — Z21 Asymptomatic human immunodeficiency virus [HIV] infection status: Secondary | ICD-10-CM

## 2018-10-07 DIAGNOSIS — E872 Acidosis: Secondary | ICD-10-CM | POA: Diagnosis present

## 2018-10-07 DIAGNOSIS — W19XXXA Unspecified fall, initial encounter: Secondary | ICD-10-CM | POA: Diagnosis not present

## 2018-10-07 DIAGNOSIS — E1165 Type 2 diabetes mellitus with hyperglycemia: Secondary | ICD-10-CM | POA: Diagnosis present

## 2018-10-07 DIAGNOSIS — N921 Excessive and frequent menstruation with irregular cycle: Secondary | ICD-10-CM | POA: Diagnosis not present

## 2018-10-07 DIAGNOSIS — T879 Unspecified complications of amputation stump: Secondary | ICD-10-CM | POA: Diagnosis not present

## 2018-10-07 DIAGNOSIS — E1140 Type 2 diabetes mellitus with diabetic neuropathy, unspecified: Secondary | ICD-10-CM | POA: Diagnosis present

## 2018-10-07 DIAGNOSIS — E440 Moderate protein-calorie malnutrition: Secondary | ICD-10-CM | POA: Diagnosis present

## 2018-10-07 DIAGNOSIS — F329 Major depressive disorder, single episode, unspecified: Secondary | ICD-10-CM | POA: Diagnosis present

## 2018-10-07 DIAGNOSIS — Z9889 Other specified postprocedural states: Secondary | ICD-10-CM | POA: Diagnosis not present

## 2018-10-07 DIAGNOSIS — M86272 Subacute osteomyelitis, left ankle and foot: Secondary | ICD-10-CM | POA: Diagnosis not present

## 2018-10-07 DIAGNOSIS — E1151 Type 2 diabetes mellitus with diabetic peripheral angiopathy without gangrene: Secondary | ICD-10-CM | POA: Diagnosis present

## 2018-10-07 DIAGNOSIS — E1169 Type 2 diabetes mellitus with other specified complication: Secondary | ICD-10-CM | POA: Diagnosis present

## 2018-10-07 DIAGNOSIS — L97529 Non-pressure chronic ulcer of other part of left foot with unspecified severity: Secondary | ICD-10-CM | POA: Diagnosis present

## 2018-10-07 DIAGNOSIS — Z89422 Acquired absence of other left toe(s): Secondary | ICD-10-CM

## 2018-10-07 DIAGNOSIS — E875 Hyperkalemia: Secondary | ICD-10-CM | POA: Diagnosis not present

## 2018-10-07 DIAGNOSIS — E11621 Type 2 diabetes mellitus with foot ulcer: Secondary | ICD-10-CM | POA: Diagnosis present

## 2018-10-07 DIAGNOSIS — N185 Chronic kidney disease, stage 5: Secondary | ICD-10-CM | POA: Diagnosis not present

## 2018-10-07 DIAGNOSIS — N2581 Secondary hyperparathyroidism of renal origin: Secondary | ICD-10-CM | POA: Diagnosis present

## 2018-10-07 DIAGNOSIS — E119 Type 2 diabetes mellitus without complications: Secondary | ICD-10-CM | POA: Diagnosis not present

## 2018-10-07 DIAGNOSIS — B20 Human immunodeficiency virus [HIV] disease: Secondary | ICD-10-CM | POA: Diagnosis not present

## 2018-10-07 DIAGNOSIS — D631 Anemia in chronic kidney disease: Secondary | ICD-10-CM | POA: Diagnosis not present

## 2018-10-07 DIAGNOSIS — Z89512 Acquired absence of left leg below knee: Secondary | ICD-10-CM | POA: Diagnosis not present

## 2018-10-07 LAB — POCT I-STAT EG7
Acid-base deficit: 11 mmol/L — ABNORMAL HIGH (ref 0.0–2.0)
Bicarbonate: 14.5 mmol/L — ABNORMAL LOW (ref 20.0–28.0)
Calcium, Ion: 1.04 mmol/L — ABNORMAL LOW (ref 1.15–1.40)
HCT: 33 % — ABNORMAL LOW (ref 36.0–46.0)
Hemoglobin: 11.2 g/dL — ABNORMAL LOW (ref 12.0–15.0)
O2 Saturation: 94 %
Potassium: 4.7 mmol/L (ref 3.5–5.1)
Sodium: 139 mmol/L (ref 135–145)
TCO2: 15 mmol/L — ABNORMAL LOW (ref 22–32)
pCO2, Ven: 29.3 mmHg — ABNORMAL LOW (ref 44.0–60.0)
pH, Ven: 7.301 (ref 7.250–7.430)
pO2, Ven: 78 mmHg — ABNORMAL HIGH (ref 32.0–45.0)

## 2018-10-07 LAB — GLUCOSE, CAPILLARY
Glucose-Capillary: 112 mg/dL — ABNORMAL HIGH (ref 70–99)
Glucose-Capillary: 129 mg/dL — ABNORMAL HIGH (ref 70–99)
Glucose-Capillary: 95 mg/dL (ref 70–99)
Glucose-Capillary: 92 mg/dL (ref 70–99)

## 2018-10-07 LAB — BETA-HYDROXYBUTYRIC ACID: Beta-Hydroxybutyric Acid: 0.05 mmol/L (ref 0.05–0.27)

## 2018-10-07 LAB — SALICYLATE LEVEL: Salicylate Lvl: <7 mg/dL (ref 2.8–30.0)

## 2018-10-07 LAB — LACTIC ACID, PLASMA: Lactic Acid, Venous: 1.2 mmol/L (ref 0.5–1.9)

## 2018-10-07 LAB — C-REACTIVE PROTEIN: CRP: <0.8 mg/dL (ref <1.0)

## 2018-10-07 LAB — SURGICAL PCR SCREEN
MRSA, PCR: NEGATIVE
Staphylococcus aureus: NEGATIVE

## 2018-10-07 MED ORDER — VANCOMYCIN HCL IN DEXTROSE 1-5 GM/200ML-% IV SOLN
1000.0000 mg | Freq: Once | INTRAVENOUS | Status: AC
  Administered 2018-10-07: 1000 mg via INTRAVENOUS
  Filled 2018-10-07: qty 200

## 2018-10-07 MED ORDER — CITALOPRAM HYDROBROMIDE 20 MG PO TABS
20.0000 mg | ORAL_TABLET | Freq: Every day | ORAL | Status: DC
  Administered 2018-10-07 – 2018-10-16 (×9): 20 mg via ORAL
  Filled 2018-10-07: qty 2
  Filled 2018-10-07 (×3): qty 1
  Filled 2018-10-07: qty 2
  Filled 2018-10-07 (×5): qty 1

## 2018-10-07 MED ORDER — ONDANSETRON HCL 4 MG PO TABS: 4.0000 mg | ORAL_TABLET | Freq: Three times a day (TID) | ORAL | Status: DC | PRN

## 2018-10-07 MED ORDER — HYDROMORPHONE HCL 1 MG/ML IJ SOLN
0.5000 mg | Freq: Three times a day (TID) | INTRAMUSCULAR | Status: DC | PRN
  Administered 2018-10-07 – 2018-10-08 (×3): 0.5 mg via INTRAVENOUS
  Filled 2018-10-07 (×3): qty 1

## 2018-10-07 MED ORDER — HEPARIN SODIUM (PORCINE) 5000 UNIT/ML IJ SOLN
5000.0000 [IU] | Freq: Three times a day (TID) | INTRAMUSCULAR | Status: DC
  Administered 2018-10-07 – 2018-10-16 (×25): 5000 [IU] via SUBCUTANEOUS
  Filled 2018-10-07 (×27): qty 1

## 2018-10-07 MED ORDER — LACTATED RINGERS IV SOLN: INTRAVENOUS | Status: DC

## 2018-10-07 MED ORDER — INSULIN GLARGINE 100 UNIT/ML ~~LOC~~ SOLN
15.0000 [IU] | Freq: Every day | SUBCUTANEOUS | Status: DC
  Administered 2018-10-08 – 2018-10-11 (×3): 15 [IU] via SUBCUTANEOUS
  Filled 2018-10-07 (×4): qty 0.15

## 2018-10-07 MED ORDER — SENNOSIDES-DOCUSATE SODIUM 8.6-50 MG PO TABS: 1.0000 | ORAL_TABLET | Freq: Every evening | ORAL | Status: DC | PRN

## 2018-10-07 MED ORDER — INSULIN ASPART 100 UNIT/ML ~~LOC~~ SOLN
0.0000 [IU] | Freq: Three times a day (TID) | SUBCUTANEOUS | Status: DC
  Administered 2018-10-07 (×2): 2 [IU] via SUBCUTANEOUS
  Administered 2018-10-08: 3 [IU] via SUBCUTANEOUS
  Administered 2018-10-09 – 2018-10-12 (×5): 2 [IU] via SUBCUTANEOUS
  Administered 2018-10-12: 3 [IU] via SUBCUTANEOUS
  Administered 2018-10-13: 2 [IU] via SUBCUTANEOUS
  Administered 2018-10-14: 11 [IU] via SUBCUTANEOUS
  Administered 2018-10-14: 5 [IU] via SUBCUTANEOUS
  Administered 2018-10-15: 2 [IU] via SUBCUTANEOUS

## 2018-10-07 MED ORDER — VANCOMYCIN HCL IN DEXTROSE 750-5 MG/150ML-% IV SOLN
750.0000 mg | INTRAVENOUS | Status: DC
  Administered 2018-10-08: 750 mg via INTRAVENOUS
  Filled 2018-10-07: qty 150

## 2018-10-07 MED ORDER — OXYCODONE HCL 5 MG PO TABS
5.0000 mg | ORAL_TABLET | ORAL | Status: DC | PRN
  Administered 2018-10-07 – 2018-10-09 (×6): 5 mg via ORAL
  Filled 2018-10-07 (×6): qty 1

## 2018-10-07 MED ORDER — ONDANSETRON HCL 4 MG/2ML IJ SOLN
4.0000 mg | Freq: Once | INTRAMUSCULAR | Status: AC
  Administered 2018-10-07: 4 mg via INTRAVENOUS
  Filled 2018-10-07: qty 2

## 2018-10-07 MED ORDER — ONDANSETRON HCL 4 MG/2ML IJ SOLN
4.0000 mg | Freq: Three times a day (TID) | INTRAMUSCULAR | Status: DC | PRN
  Administered 2018-10-07 – 2018-10-08 (×3): 4 mg via INTRAVENOUS
  Filled 2018-10-07 (×3): qty 2

## 2018-10-07 MED ORDER — MORPHINE SULFATE (PF) 4 MG/ML IV SOLN
4.0000 mg | Freq: Once | INTRAVENOUS | Status: AC
  Administered 2018-10-07: 4 mg via INTRAVENOUS
  Filled 2018-10-07: qty 1

## 2018-10-07 MED ORDER — OXYCODONE HCL 5 MG PO TABS
5.0000 mg | ORAL_TABLET | ORAL | Status: DC | PRN
  Administered 2018-10-07: 5 mg via ORAL
  Filled 2018-10-07: qty 1

## 2018-10-07 MED ORDER — DOLUTEGRAVIR SODIUM 50 MG PO TABS
50.0000 mg | ORAL_TABLET | Freq: Every day | ORAL | Status: DC
  Administered 2018-10-07 – 2018-10-16 (×9): 50 mg via ORAL
  Filled 2018-10-07 (×10): qty 1

## 2018-10-07 MED ORDER — MUPIROCIN 2 % EX OINT: 1.0000 "application " | TOPICAL_OINTMENT | Freq: Two times a day (BID) | CUTANEOUS | Status: DC

## 2018-10-07 MED ORDER — INSULIN GLARGINE 100 UNIT/ML ~~LOC~~ SOLN
15.0000 [IU] | Freq: Every day | SUBCUTANEOUS | Status: DC
  Filled 2018-10-07: qty 0.15

## 2018-10-07 MED ORDER — AMLODIPINE BESYLATE 10 MG PO TABS
10.0000 mg | ORAL_TABLET | Freq: Every day | ORAL | Status: DC
  Administered 2018-10-07 – 2018-10-16 (×9): 10 mg via ORAL
  Filled 2018-10-07 (×2): qty 1
  Filled 2018-10-07: qty 2
  Filled 2018-10-07 (×6): qty 1

## 2018-10-07 MED ORDER — RILPIVIRINE HCL 25 MG PO TABS
25.0000 mg | ORAL_TABLET | Freq: Every day | ORAL | Status: DC
  Administered 2018-10-07 – 2018-10-16 (×9): 25 mg via ORAL
  Filled 2018-10-07 (×10): qty 1

## 2018-10-07 MED ORDER — DOLUTEGRAVIR-RILPIVIRINE 50-25 MG PO TABS: 1.0000 | ORAL_TABLET | Freq: Every day | ORAL | Status: DC

## 2018-10-07 MED ORDER — PIPERACILLIN-TAZOBACTAM 3.375 G IVPB
3.3750 g | Freq: Once | INTRAVENOUS | Status: AC
  Administered 2018-10-07: 3.375 g via INTRAVENOUS
  Filled 2018-10-07: qty 50

## 2018-10-07 MED ORDER — SODIUM CHLORIDE 0.9 % IV SOLN
1.0000 g | INTRAVENOUS | Status: DC
  Administered 2018-10-07 – 2018-10-09 (×3): 1 g via INTRAVENOUS
  Filled 2018-10-07 (×4): qty 1

## 2018-10-07 MED ORDER — ACETAMINOPHEN 650 MG RE SUPP: 650.0000 mg | Freq: Four times a day (QID) | RECTAL | Status: DC | PRN

## 2018-10-07 MED ORDER — ACETAMINOPHEN 325 MG PO TABS
650.0000 mg | ORAL_TABLET | Freq: Four times a day (QID) | ORAL | Status: DC | PRN
  Administered 2018-10-08 – 2018-10-15 (×9): 650 mg via ORAL
  Filled 2018-10-07 (×9): qty 2

## 2018-10-07 MED ORDER — LABETALOL HCL 100 MG PO TABS
100.0000 mg | ORAL_TABLET | Freq: Two times a day (BID) | ORAL | Status: DC
  Administered 2018-10-07 – 2018-10-09 (×5): 100 mg via ORAL
  Filled 2018-10-07 (×2): qty 0.5
  Filled 2018-10-07 (×2): qty 1
  Filled 2018-10-07 (×2): qty 0.5

## 2018-10-07 MED ORDER — CHLORHEXIDINE GLUCONATE 4 % EX LIQD
60.0000 mL | Freq: Once | CUTANEOUS | Status: AC
  Administered 2018-10-08: 4 via TOPICAL
  Filled 2018-10-07: qty 60

## 2018-10-07 MED ORDER — CEFAZOLIN SODIUM-DEXTROSE 2-4 GM/100ML-% IV SOLN
2.0000 g | INTRAVENOUS | Status: DC
  Filled 2018-10-07: qty 100

## 2018-10-07 NOTE — H&P (Signed)
Date: 10/07/2018               Patient Name:  Kathy Frank MRN: [id number]  DOB: 1975-10-14 Age / Sex: 43 y.o., female   PCP: Kathy Mage, MD         Medical Service: Internal Medicine Teaching Service         Attending Physician: Dr. Rebeca Alert Raynaldo Opitz, MD    First Contact: Dr. Donne Hazel Pager: [phone]  Second Contact: Dr. Trilby Drummer Pager: [phone]       After Hours (After 5p/  First Contact Pager: [phone]  weekends / holidays): Second Contact Pager: [phone]   Chief Complaint: Wound infection  History of Present Illness: [name] is a 43 year old female with diabetes, HTN, CKD stage 5 and HIV presenting with left foot pain and osteomyelitis s/p transmetatarsal amputation 12/19. She reports she had a left foot amputation in November 2019 which was followed with an infection and required a second transmetatarsal amputation at the end of December. She states at first she thought her foot was healing well. However, about three weeks ago she noticed her wound opened and had foul smelling dark brown discharge. She was seen by her orthopedic doctor and diagnosed with a wound infection and started on septra and advised to wash the foot with soap and water while applying iodosorb ointment. She was supposed to follow up the following week for possible revision surgery but due to a family death she missed her appointment. She reports the pain worsened and she continued to have drainage. She states the pain is a 8/10 and is sharp mostly on the ventral surface of her left foot. Denies any swelling, fevers, chills, nausea or vomiting. She has washed her foot daily and kept it clean.  ED course: On arrival, she was hypertensive. Afebrile with no leukocytosis. Lactic acid 1.2. Foot xray showed recurrent suspected osteomyelitis associated with stump ulcer. She was started on vancomycin and zoysn in the ED.   Meds:  No current facility-administered medications on file prior to encounter.     Current Outpatient Medications on File Prior to Encounter  Medication Sig Dispense Refill  . amLODipine (NORVASC) 10 MG tablet Take 1 tablet (10 mg total) by mouth daily. 30 tablet 2  . aspirin 81 MG EC tablet Take 1 tablet (81 mg total) by mouth daily. 90 tablet 3  . citalopram (CELEXA) 20 MG tablet Take 1 tablet (20 mg total) by mouth daily. 30 tablet 0  . Dolutegravir-Rilpivirine (JULUCA) 50-25 MG TABS Take 1 tablet by mouth daily. Please take with largest meal of the day. 30 tablet 2  . glucose blood (ACCU-CHEK GUIDE) test strip USE TO TEST BLOOD SUGAR 3 TIMES DAILY . Diagnosis code  E11.69, E66.9 100 each 11  . Insulin Glargine (LANTUS) 100 UNIT/ML Solostar Pen Inject 15 Units into the skin daily at 10 pm. (Patient taking differently: Inject 15 Units into the skin at bedtime. ) 15 mL   . labetalol (NORMODYNE) 100 MG tablet TAKE 1 TABLET BY MOUTH 2 TIMES DAILY (Patient taking differently: Take 100 mg by mouth 2 (two) times daily. ) 180 tablet 0  . liraglutide (VICTOZA) 18 MG/3ML SOPN Inject 0.2 mLs (1.2 mg total) into the skin daily. 3 mL 1  . Multiple Vitamin (MULTIVITAMIN WITH MINERALS) TABS tablet Take 1 tablet by mouth daily. 90 tablet 0  . ondansetron (ZOFRAN) 4 MG tablet Take 1 tablet (4 mg total) by mouth daily as needed for  nausea or vomiting. 30 tablet 0  . polyethylene glycol (MIRALAX / GLYCOLAX) packet Take 17 g by mouth daily. 14 each 0  . senna (SENOKOT) 8.6 MG TABS tablet Take 2 tablets (17.2 mg total) by mouth 2 (two) times daily. 30 each 0  . silver sulfADIAZINE (SILVADENE) 1 % cream Apply 1 application topically daily. 50 g 0  . sodium bicarbonate 650 MG tablet Take 1 tablet (650 mg total) by mouth 2 (two) times daily. 60 tablet 6  . sulfamethoxazole-trimethoprim (BACTRIM DS,SEPTRA DS) 800-160 MG tablet Take 1 tablet by mouth 2 (two) times daily. 20 tablet 0  . triamcinolone cream (KENALOG) 0.1 % Apply 1 application topically 2 (two) times daily. (Patient taking differently:  Apply 1 application topically 2 (two) times daily as needed (itching). ) 30 g 0   No outpatient medications have been marked as taking for the 10/06/18 encounter [facility] Encounter).     Allergies: Allergies as of 10/06/2018  . (No Known Allergies)   Past Medical History:  Diagnosis Date  . CKD (chronic kidney disease) stage 5, GFR less than 15 ml/min (HCC) 06/19/2016   06/30/16: Seen by Dr. Posey Pronto [Nephrology]. Suspect etiologies to include diabetes and hypertension though cannot exclude HIV-associated nephropathy. Advised her against from using excessive Goody powder.  . Contact dermatitis 04/21/2016   07/29/16: [facility] biopsy performed by [facility] for acute spongiotic dermatitis consistent with contact dermatitis.  . Cyclical vomiting    THC induced???  . Depression 06/28/2006   Qualifier: Diagnosis of  By: Riccardo Dubin MD, Todd    . Diabetes mellitus type 2 in obese ([place]) 06/28/1994  . DKA (diabetic ketoacidoses) ([place]) 12/2014  . Erosive esophagitis   . Esophageal reflux   . Essential hypertension 11/16/2013  . Gastroparesis    ? diabetic  . GERD (gastroesophageal reflux disease)   . Human immunodeficiency virus (HIV) disease ([place]) 04/23/2016  . Hypertension   . Moderate nonproliferative diabetic retinopathy of both eyes ([place]) 11/21/2014   11/14/14: Noted on retinal imaging; needs follow-up imaging in 6 months  05/22/16: Noted on retinal imaging again; needs follow-up imaging in 6 months  . Normocytic anemia   . TOBACCO USER 06/28/2006   Qualifier: Diagnosis of  By: Riccardo Dubin MD, Sherren Mocha    . Type II diabetes mellitus ([place]) 1994   diagnosed around 1    Family History:  Family History  Problem Relation Age of Onset  . Diabetes Mother   . Diabetes Brother   . Diabetes Daughter   . Diabetes Daughter   . Mental retardation Brother        died from PNA  . Diabetes Maternal Grandmother     Social History:   She lives at home with her daughters and is able to  perform her ADL's and still get around without any walking aid post amputation. She smoked 1/2 a pack of cigarettes a day for about 10 years; quit five years ago. She uses marijuana about 3 days a week since she was in high school. Denies any alcohol use.  Social History   Socioeconomic History  . Marital status: Single    Spouse name: Not on file  . Number of children: Not on file  . Years of education: 58  . Highest education level: Not on file  Occupational History    Employer: UNEMPLOYED  Social Needs  . Financial resource strain: Not on file  . Food insecurity:    Worry: Not on file    Inability:  Not on file  . Transportation needs:    Medical: Not on file    Non-medical: Not on file  Tobacco Use  . Smoking status: Former Smoker    Packs/day: 0.10    Years: 10.00    Pack years: 1.00    Types: Cigarettes    Start date: 11/17/2013    Last attempt to quit: 09/09/2014    Years since quitting: 4.0  . Smokeless tobacco: Never Used  Substance and Sexual Activity  . Alcohol use: Yes    Comment: Occasionally.  . Drug use: Yes    Frequency: 4.0 times per week    Types: Marijuana  . Sexual activity: Not on file  Lifestyle  . Physical activity:    Days per week: Not on file    Minutes per session: Not on file  . Stress: Not on file  Relationships  . Social connections:    Talks on phone: Not on file    Gets together: Not on file    Attends religious service: Not on file    Active member of club or organization: Not on file    Attends meetings of clubs or organizations: Not on file    Relationship status: Not on file  . Intimate partner violence:    Fear of current or ex partner: Not on file    Emotionally abused: Not on file    Physically abused: Not on file    Forced sexual activity: Not on file  Other Topics Concern  . Not on file  Social History Narrative  . Not on file    Review of Systems: A complete ROS was negative except as per HPI.   Physical  Exam: Blood pressure (!) 147/94, pulse 97, temperature 98.2 F (36.8 C), temperature source Oral, resp. rate 16, height 5\' 5"  (1.651 m), weight 72.6 kg, SpO2 100 %.  Physical Exam  Constitutional: She is oriented to person, place, and time and well-developed, well-nourished, and in no distress.  Cardiovascular: Normal rate, regular rhythm and normal heart sounds.  No murmur heard. Pulmonary/Chest: Effort normal and breath sounds normal. No respiratory distress. She has no wheezes.  Abdominal: Soft. Bowel sounds are normal. She exhibits no distension. There is no abdominal tenderness.  Musculoskeletal:        General: Tenderness, deformity and edema present.  Neurological: She is alert and oriented to person, place, and time.  Skin: Skin is warm. No rash noted. No erythema.  fibrous tissue and mild serosanguinous drainage. Mild edema of the foot. Palpable dorsalis pedis pulse  Psychiatric: Mood, memory, affect and judgment normal.    EKG: n/a  CXR: n/a  Assessment & Plan by Problem: Active Problems:   Osteomyelitis ([place])  [name] is a 43 year old female with diabetes, HTN, CKD stage 5 and HIV presenting with left foot osteomyelitis s/p transmetatarsal amputation. She reports she had a left foot amputation in November 2019 which was followed with an infection and required a second transmetatarsal amputation at the end of December.   Left Foot Osteomyelitis  Patient hospitalized 08/06/18 - 08/12/2018 for wound dehiscence after left 4th digit amputation on left foot leading to osteomyelitis and is s/p transmetatarsal amputation by Dr. Sharol Given. On 2/6 patient was found to have an infection s/p transmetatarsal amputation. She was supposed to follow up the following week for possible revision surgery but missed the appointment due to a family death. Presented with worsening pain and discharge. Imaging showed suspected osteomyelitis associated with stump ulcer.  Afebrile with no  leukocytosis - Started on vancomycin and cefepime  - Follow up with orthopedics  - Pain control with oxycodone 5 mg q4h prn  DM - Continue lantus 15 units daily - SSI moderate   HTN - continue amoldipine 10 mg and labetolol 100 mg bid   CKD Stage 5 Cr 6.71 on admission, baseline closer to ~5.8 - Follow up bmp  HIV - continue Dolutegravir-Rilpivirine daily   Diet: Regular VTE prophylaxis: Heparin Full code   Dispo: Admit patient to Inpatient with expected length of stay greater than 2 midnights.  SignedMike Craze, DO 10/07/2018, 4:02 AM  Pager: [phone]

## 2018-10-07 NOTE — H&P (View-Only) (Signed)
ORTHOPAEDIC CONSULTATION  REQUESTING PHYSICIAN: Oda Kilts, MD  Chief Complaint: Dehiscence transmetatarsal amputation left foot with foul-smelling drainage.  HPI: Kathy Frank is a 43 y.o. female who presents with dehiscence of her left transmetatarsal amputation with foul-smelling drainage.  Patient underwent a left transmetatarsal amputation approximately 2 months ago.  Patient was lost to follow-up due to the fact that she left due to a death in the family.  Patient presents at this time with persistent ulceration foul-smelling drainage.  Past Medical History:  Diagnosis Date  . CKD (chronic kidney disease) stage 5, GFR less than 15 ml/min (HCC) 06/19/2016   06/30/16: Seen by Dr. Posey Pronto [Nephrology]. Suspect etiologies to include diabetes and hypertension though cannot exclude HIV-associated nephropathy. Advised her against from using excessive Goody powder.  . Contact dermatitis 04/21/2016   07/29/16: [facility] biopsy performed by [facility] for acute spongiotic dermatitis consistent with contact dermatitis.  . Cyclical vomiting    THC induced???  . Depression 06/28/2006   Qualifier: Diagnosis of  By: Riccardo Dubin MD, Todd    . Diabetes mellitus type 2 in obese ([place]) 06/28/1994  . DKA (diabetic ketoacidoses) ([place]) 12/2014  . Erosive esophagitis   . Esophageal reflux   . Essential hypertension 11/16/2013  . Gastroparesis    ? diabetic  . GERD (gastroesophageal reflux disease)   . Human immunodeficiency virus (HIV) disease ([place]) 04/23/2016  . Hypertension   . Moderate nonproliferative diabetic retinopathy of both eyes ([place]) 11/21/2014   11/14/14: Noted on retinal imaging; needs follow-up imaging in 6 months  05/22/16: Noted on retinal imaging again; needs follow-up imaging in 6 months  . Normocytic anemia   . TOBACCO USER 06/28/2006   Qualifier: Diagnosis of  By: Riccardo Dubin MD, Sherren Mocha    . Type II diabetes mellitus ([place]) 1994   diagnosed around 1994   Past  Surgical History:  Procedure Laterality Date  . AMPUTATION Left 07/08/2018   Procedure: AMPUTATION FORTH RAY LEFT FOOT;  Surgeon: [name], MD;  Location: [facility];  Service: Orthopedics;  Laterality: Left;  . AMPUTATION Left 08/09/2018   Procedure: Left Transmetatarsal Amputation;  Surgeon: [name], MD;  Location: [facility];  Service: Orthopedics;  Laterality: Left;  . LOWER EXTREMITY ANGIOGRAPHY N/A 07/05/2018   Procedure: LOWER EXTREMITY ANGIOGRAPHY;  Surgeon: Serafina Mitchell, MD;  Location: [facility];  Service: Cardiovascular;  Laterality: N/A;  . PERIPHERAL VASCULAR BALLOON ANGIOPLASTY Left 07/05/2018   Procedure: PERIPHERAL VASCULAR BALLOON ANGIOPLASTY;  Surgeon: Serafina Mitchell, MD;  Location: [facility];  Service: Cardiovascular;  Laterality: Left;  SFA  . TUBAL LIGATION  2002   Social History   Socioeconomic History  . Marital status: Single    Spouse name: Not on file  . Number of children: Not on file  . Years of education: 44  . Highest education level: Not on file  Occupational History    Employer: UNEMPLOYED  Social Needs  . Financial resource strain: Not on file  . Food insecurity:    Worry: Not on file    Inability: Not on file  . Transportation needs:    Medical: Not on file    Non-medical: Not on file  Tobacco Use  . Smoking status: Former Smoker    Packs/day: 0.10    Years: 10.00    Pack years: 1.00    Types: Cigarettes    Start date: 11/17/2013    Last attempt to quit: 09/09/2014  Years since quitting: 4.0  . Smokeless tobacco: Never Used  Substance and Sexual Activity  . Alcohol use: Yes    Comment: Occasionally.  . Drug use: Yes    Frequency: 4.0 times per week    Types: Marijuana  . Sexual activity: Not on file  Lifestyle  . Physical activity:    Days per week: Not on file    Minutes per session: Not on file  . Stress: Not on file  Relationships  . Social connections:    Talks on phone: Not on file    Gets  together: Not on file    Attends religious service: Not on file    Active member of club or organization: Not on file    Attends meetings of clubs or organizations: Not on file    Relationship status: Not on file  Other Topics Concern  . Not on file  Social History Narrative  . Not on file   Family History  Problem Relation Age of Onset  . Diabetes Mother   . Diabetes Brother   . Diabetes Daughter   . Diabetes Daughter   . Mental retardation Brother        died from PNA  . Diabetes Maternal Grandmother    - negative except otherwise stated in the family history section No Known Allergies Prior to Admission medications   Medication Sig Start Date End Date Taking? Authorizing Provider  amLODipine (NORVASC) 10 MG tablet Take 1 tablet (10 mg total) by mouth daily. 12/24/17 10/07/18 Yes Rice, Resa Miner, MD  aspirin 81 MG EC tablet Take 1 tablet (81 mg total) by mouth daily. 07/12/18  Yes Mosetta Anis, MD  citalopram (CELEXA) 20 MG tablet Take 1 tablet (20 mg total) by mouth daily. 08/12/18 10/07/18 Yes Carroll Sage, MD  Dolutegravir-Rilpivirine (JULUCA) 50-25 MG TABS Take 1 tablet by mouth daily. Please take with largest meal of the day. 07/05/18  Yes Annia Belt, MD  Insulin Glargine (LANTUS) 100 UNIT/ML Solostar Pen Inject 15 Units into the skin daily at 10 pm. Patient taking differently: Inject 15 Units into the skin at bedtime.  12/24/17  Yes Rice, Resa Miner, MD  labetalol (NORMODYNE) 100 MG tablet TAKE 1 TABLET BY MOUTH 2 TIMES DAILY Patient taking differently: Take 100 mg by mouth 2 (two) times daily.  07/13/18  Yes Chundi, Vahini, MD  liraglutide (VICTOZA) 18 MG/3ML SOPN Inject 0.2 mLs (1.2 mg total) into the skin daily. 02/04/18  Yes Chundi, Vahini, MD  Multiple Vitamin (MULTIVITAMIN WITH MINERALS) TABS tablet Take 1 tablet by mouth daily. 12/24/17  Yes Rice, Resa Miner, MD  ondansetron (ZOFRAN) 4 MG tablet Take 1 tablet (4 mg total) by mouth daily as needed for  nausea or vomiting. 08/19/18 08/19/19 Yes Alphonzo Grieve, MD  oxyCODONE (OXY IR/ROXICODONE) 5 MG immediate release tablet TAKE 1 TABLET (5 MG TOTAL) BY MOUTH EVERY 6 (SIX) HOURS AS NEEDED FOR UP TO 7 DAYS FOR MODERATE PAIN OR SEVERE PAIN. 09/29/18 10/07/18 Yes Chundi, Vahini, MD  polyethylene glycol (MIRALAX / GLYCOLAX) packet Take 17 g by mouth daily. Patient taking differently: Take 17 g by mouth daily as needed for mild constipation.  08/12/18  Yes Carroll Sage, MD  senna (SENOKOT) 8.6 MG TABS tablet Take 2 tablets (17.2 mg total) by mouth 2 (two) times daily. Patient taking differently: Take 2 tablets by mouth daily as needed for mild constipation.  08/12/18  Yes Carroll Sage, MD  silver sulfADIAZINE (SILVADENE) 1 %  cream Apply 1 application topically daily. 08/01/18  Yes Rayburn, Neta Mends, PA-C  sodium bicarbonate 650 MG tablet Take 1 tablet (650 mg total) by mouth 2 (two) times daily. 12/24/17  Yes Rice, Resa Miner, MD  triamcinolone cream (KENALOG) 0.1 % Apply 1 application topically 2 (two) times daily. Patient taking differently: Apply 1 application topically 2 (two) times daily as needed (itching).  02/04/18  Yes Chundi, Vahini, MD  glucose blood (ACCU-CHEK GUIDE) test strip USE TO TEST BLOOD SUGAR 3 TIMES DAILY . Diagnosis code  E11.69, E66.9 07/09/17   Lars Mage, MD  sulfamethoxazole-trimethoprim (BACTRIM DS,SEPTRA DS) 800-160 MG tablet Take 1 tablet by mouth 2 (two) times daily. Patient not taking: Reported on 10/07/2018 09/15/18   Rayburn, Neta Mends, PA-C   Dg Foot Complete Left  Result Date: 10/07/2018 CLINICAL DATA:  Drainage from stump, status post amputation December. EXAM: LEFT FOOT - COMPLETE 3+ VIEW COMPARISON:  LEFT foot radiograph August 06, 2018 FINDINGS: No fracture deformity or dislocation. Status post transmetatarsal amputation, slight osteolysis of the distal bone seen only on lateral radiograph. Associated skin ulcer and soft tissue swelling without  subcutaneous gas or radiopaque foreign bodies. IMPRESSION: 1. Status post transmetatarsal amputation with recurrent suspected osteomyelitis associated with stump ulcer. Electronically Signed   By: Elon Alas M.D.   On: 10/07/2018 01:49   - pertinent xrays, CT, MRI studies were reviewed and independently interpreted  Positive ROS: All other systems have been reviewed and were otherwise negative with the exception of those mentioned in the HPI and as above.  Physical Exam: General: Alert, no acute distress Psychiatric: Patient is competent for consent with normal mood and affect Lymphatic: No axillary or cervical lymphadenopathy Cardiovascular: No pedal edema Respiratory: No cyanosis, no use of accessory musculature GI: No organomegaly, abdomen is soft and non-tender    Images:  @ENCIMAGES @  Labs:  Lab Results  Component Value Date   HGBA1C 6.5 (H) 08/06/2018   HGBA1C 7.9 (A) 02/04/2018   HGBA1C 9.0 (H) 09/13/2017   ESRSEDRATE 21 08/06/2018   ESRSEDRATE 85 (H) 02/02/2017   CRP <0.8 10/07/2018   CRP <0.8 08/06/2018   CRP 42.4 (H) 02/02/2017   REPTSTATUS PENDING 10/07/2018   REPTSTATUS PENDING 10/07/2018   GRAMSTAIN  10/07/2018    NO WBC SEEN RARE GRAM POSITIVE COCCI IN PAIRS FEW GRAM POSITIVE RODS Performed at [facility], [place] [address], [place], [place] [address]    CULT NO GROWTH < 12 HOURS 10/07/2018   CULT PENDING 10/07/2018   LABORGA STAPHYLOCOCCUS AUREUS 07/08/2018    Lab Results  Component Value Date   ALBUMIN 3.2 (L) 10/06/2018   ALBUMIN 2.7 (L) 08/11/2018   ALBUMIN 2.6 (L) 08/07/2018    Neurologic: Patient does not have protective sensation bilateral lower extremities.   MUSCULOSKELETAL:   Skin: Examination there is dehiscence of the transmetatarsal amputation this probes down to bone there is necrotic tissue changes.  Patient has a good dorsalis pedis pulse.  There is no ascending cellulitis she does have  swelling.  Assessment: Assessment: Diabetic insensate neuropathy with moderate protein caloric malnutrition with osteomyelitis and wound dehiscence of her left transmetatarsal amputation.  Plan: Discussed with the patient she does not have any foot salvage options despite the fact that she has a good dorsalis pedis pulse she has severe microcirculatory disease.  Recommended proceeding with a transtibial amputation risks of complications and wound healing were discussed.  Patient states she understands wished to proceed at this time plan for surgery  tomorrow morning Saturday.  Thank you for the consult and the opportunity to see Kathy Frank, [place] [phone] 3:31 PM

## 2018-10-07 NOTE — ED Notes (Signed)
Patient transported to X-ray 

## 2018-10-07 NOTE — Discharge Summary (Addendum)
Name: Kathy Frank MRN: [id number] DOB: 06/01/1976 43 y.o. PCP: Lars Mage, MD  Date of Admission: 10/06/2018 10:06 PM Date of Discharge: 10/16/2018 Attending Physician: Dr. Lalla Brothers  Discharge Diagnosis: 1. Osteomyelitis of prior transmetatarsal amputation  2. CKDV 3. Iron deficiency anemia 4. DMII 5. HIV  Discharge Medications: Allergies as of 10/16/2018   No Known Allergies     Medication List    STOP taking these medications   aspirin 81 MG EC tablet   multivitamin with minerals Tabs tablet   silver sulfADIAZINE 1 % cream Commonly known as:  Silvadene   triamcinolone cream 0.1 % Commonly known as:  KENALOG     TAKE these medications   amLODipine 10 MG tablet Commonly known as:  NORVASC Take 1 tablet (10 mg total) by mouth daily.   calcitRIOL 0.25 MCG capsule Commonly known as:  ROCALTROL Take 1 capsule (0.25 mcg total) by mouth daily.   calcium acetate 667 MG capsule Commonly known as:  PHOSLO Take 2 capsules (1,334 mg total) by mouth 3 (three) times daily with meals.   citalopram 20 MG tablet Commonly known as:  CeleXA Take 1 tablet (20 mg total) by mouth daily.   Darbepoetin Alfa 60 MCG/0.3ML Sosy injection Commonly known as:  ARANESP Inject 0.3 mLs (60 mcg total) into the skin every Tuesday at 6 PM. Start taking on:  October 18, 2018   Dolutegravir-Rilpivirine 50-25 MG Tabs Commonly known as:  Juluca Take 1 tablet by mouth daily. Please take with largest meal of the day.   gabapentin 300 MG capsule Commonly known as:  NEURONTIN Take 1 capsule (300 mg total) by mouth daily.   glucose blood test strip Commonly known as:  Accu-Chek Guide USE TO TEST BLOOD SUGAR 3 TIMES DAILY . Diagnosis code  E11.69, E66.9   Insulin Glargine 100 UNIT/ML Solostar Pen Commonly known as:  LANTUS Inject 5 Units into the skin at bedtime. What changed:    how much to take  when to take this   labetalol 100 MG tablet Commonly known as:   NORMODYNE TAKE 1 TABLET BY MOUTH 2 TIMES DAILY   liraglutide 18 MG/3ML Sopn Commonly known as:  Victoza Inject 0.2 mLs (1.2 mg total) into the skin daily.   methocarbamol 500 MG tablet Commonly known as:  ROBAXIN Take 1 tablet (500 mg total) by mouth every 6 (six) hours as needed for muscle spasms.   nutrition supplement (JUVEN) Pack Take 1 packet by mouth 2 (two) times daily between meals.   ondansetron 4 MG tablet Commonly known as:  Zofran Take 1 tablet (4 mg total) by mouth daily as needed for nausea or vomiting.   oxyCODONE 5 MG immediate release tablet Commonly known as:  Oxy IR/ROXICODONE Take 1 tablet (5 mg total) by mouth every 6 (six) hours as needed for up to 5 days for moderate pain or severe pain.   polyethylene glycol packet Commonly known as:  MIRALAX / GLYCOLAX Take 17 g by mouth daily. What changed:    when to take this  reasons to take this   senna 8.6 MG Tabs tablet Commonly known as:  SENOKOT Take 2 tablets (17.2 mg total) by mouth 2 (two) times daily. What changed:    when to take this  reasons to take this   sodium bicarbonate 650 MG tablet Take 2 tablets (1,300 mg total) by mouth 2 (two) times daily. What changed:  how much to take   sodium zirconium cyclosilicate 10 g  Pack packet Commonly known as:  Lokelma Take 10 g by mouth 3 (three) times daily.   sulfamethoxazole-trimethoprim 800-160 MG tablet Commonly known as:  BACTRIM DS,SEPTRA DS Take 1 tablet by mouth 2 (two) times daily.       Disposition and follow-up:   Kathy Frank was discharged from [facility] in Stable condition.  At the hospital follow up visit please address:  1. Osteomyelitis of prior transmetatarsal amputation  - Please ensure patient has appropriate f/u with orthopedics and wound care with dry dressing.  2. CKDV - Please ensure patient has appropriate f/u with nephro - Cr at discharge 6.94. K at discharge 5.1. - Underwent AVF placement  during admission not yet mature - Please continue to monitor electrolytes and kidney function  3. Iron deficiency anemia - Hb at discharge 8.8 - Please repeat Hb at follow-up  4. DMII  - Decreased insulin requirement during admission - Discharged on Lantus 5 untis and liraglutide, consider discontinuing lantus given the low dose - Please monitor blood sugars  2.  Labs / imaging needed at time of follow-up: BMP, CBC  3.  Pending labs/ test needing follow-up: none  Follow-up Appointments: Follow-up Information    [name], MD In 1 week.   Specialty:  Orthopedic Surgery Contact information: [place] [address] [phone]        [name], MD In 6 weeks.   Specialty:  Vascular Surgery Why:  Office will call you to arrange your appt (sent) Contact information: [place] [id number] [place] by problem list:  Left Foot Osteomyelitis: presented with purulent, foul smelling drainage from previous transmetatarsal amputation in Dec. X-ray showed evidence of osteomyelitis. She was afebrile, without leukocytosis. Did develop n/v after admission. She was started on vancomycin and cefepime. Ortho was consulted and performed a left BKA. Her wound vac was removed and she worked with PT/OT while hospitalized. She was discharged home with home health. She is to f/u with ortho in the outpatient setting.   CKD Stage 5: Cr 6.71 on admission, baseline closer to ~5.8. Cr continued to increase and she became hyperkalemic. Was started on Lokelma 10g daily. No signs of volume overload or uremia. She underwent LUE AVF placement on 3/6. She will f/u with nephrology for initiation of HD once graft matures or sooner if necessary.  Iron deficiency anemia: Patient has history of anemia 2/2 CKD. Baseline hemoglobin 8-9. Decreasedto 5.7on post-op day 2, requiring 2u RBCs. Drop in hemoglobin likely combination of  preexisting anemia 2/2 CKD as well as surgical bleeding. Hb appropriately increased to 7.7post-transfusion. Started on aranesp and iron supplementation by nephro. Hb at discharge 8.8.   DM: Pt had decreased insulin requirement during admission, likely 2/2 renal failure. Discharged home on lantus and liraglutide.   HTN: Continued on amlodipine 10mg  daily. Labetolol increased from 100mg  bid to 200mg  tid with better BP control.   HIV: Continued Dolutegravir-Rilpivirine daily   Discharge Vitals:   BP 140/66 (BP Location: Right Arm)   Pulse 79   Temp 97.7 F (36.5 C) (Oral)   Resp 16   Ht 5\' 5"  (1.651 m)   Wt 72.6 kg   SpO2 96%   BMI 26.63 kg/m   Pertinent Labs, Studies, and Procedures:   CRP on admission < 0.8  DG foot, left: No fracture deformity or dislocation. Status post transmetatarsal amputation, slight osteolysis of  the distal bone seen only on lateral radiograph. Associated skin ulcer and soft tissue swelling without subcutaneous gas or radiopaque foreign bodies.  IMPRESSION: 1. Status post transmetatarsal amputation with recurrent suspected osteomyelitis associated with stump ulcer.  Discharge Instructions: Discharge Instructions    Call MD for:  hives   Complete by:  As directed    Call MD for:  persistant dizziness or light-headedness   Complete by:  As directed    Call MD for:  persistant nausea and vomiting   Complete by:  As directed    Call MD for:  redness, tenderness, or signs of infection (pain, swelling, redness, odor or green/yellow discharge around incision site)   Complete by:  As directed    Call MD for:  temperature >100.4   Complete by:  As directed    Diet - low sodium heart healthy   Complete by:  As directed    Discharge instructions   Complete by:  As directed    Please make certain you have an appointment in the Internal Medicine Clinic early this week to be seen. Please call [phone] if they have not called you by Monday evening to  schedule.   Please use a dry gauze as we discussed and as demonstrated by the nursing staff to keep your wound clean. You will need to call the orthopedic number to make certain they have scheduled you as well for an appointment in one week.   Increase activity slowly   Complete by:  As directed       Signed: [name], MD 10/17/2018, 10:03 AM   Pager: [phone]

## 2018-10-07 NOTE — Progress Notes (Signed)
Dr Rebeca Alert was sent a text page regarding the patient wanting IV pain medication and IV anti nausea medication.  The patient has vomited once after her meal.

## 2018-10-07 NOTE — Plan of Care (Signed)
  Problem: Education: Goal: Knowledge of General Education information will improve Description Including pain rating scale, medication(s)/side effects and non-pharmacologic comfort measures Outcome: Progressing   Problem: Clinical Measurements: Goal: Respiratory complications will improve Outcome: Not Applicable Goal: Cardiovascular complication will be avoided Outcome: Progressing   Problem: Coping: Goal: Level of anxiety will decrease Outcome: Progressing   Problem: Pain Managment: Goal: General experience of comfort will improve Outcome: Progressing   Problem: Safety: Goal: Ability to remain free from injury will improve Outcome: Progressing

## 2018-10-07 NOTE — ED Notes (Addendum)
Attempted to call report to 5N x2. No answer on unit phone.

## 2018-10-07 NOTE — Progress Notes (Signed)
   Subjective: No complaints. Denies fever, chills, abdominal pain, n/v. Discussed plan to f/u ortho recs.   Objective:  Vital signs in last 24 hours: Vitals:   10/07/18 0756 10/07/18 1020 10/07/18 1021 10/07/18 1214  BP: (!) 141/81 (!) 141/81 (!) 141/81 137/69  Pulse: 95  95 90  Resp: 17   17  Temp: 98.2 F (36.8 C)   98.4 F (36.9 C)  TempSrc: Oral   Oral  SpO2: 100%   100%  Weight:      Height:       Gen: NAD, resting comfortable  Pulm: CTAB Ext: left foot bandage removed, open transmetatarsal wound with purulent, foul smelling drainage. TTP over ventral surface of foot  Assessment/Plan:  Active Problems:   Osteomyelitis ([place])  [name] is a 43 year old female with diabetes, HTN, CKD stage 5 and HIV presenting with left foot osteomyelitis s/p transmetatarsal amputation. She reports she had a left foot amputation in November 2019 which was followed with an infection and required a second transmetatarsal amputation at the end of December.   Osteomyelitis, left foot: s/p transmetatarsal amputation in Dec that is now infected with radiographic evidence of osteomyelitis.  - continue vancomycin and cefepime - consult ortho: will need revision vs BKA - pain control oxycodone 5mg  q4h prn  - CRP   DM - Continue lantus 15 units daily - SSI moderate   HTN - continue amoldipine 10 mg and labetolol 100 mg bid   CKD Stage 5 Cr 6.71 on admission, baseline closer to ~5.8 - Follow up bmp  HIV - continue Dolutegravir-Rilpivirine daily   Dispo: Anticipated discharge in 3-4 days.   Kathy Course, MD 10/07/2018, 12:20 PM Pager: [phone]

## 2018-10-07 NOTE — ED Provider Notes (Signed)
[facility] EMERGENCY DEPARTMENT Provider Note   CSN: [id number] Arrival date & time: 10/06/18  2158    History   Chief Complaint Chief Complaint  Patient presents with  . Foot Pain  . Wound Infection    HPI Kathy Frank is a 43 y.o. female.     Patient presents to the emergency department for evaluation of foot infection.  Patient reports that she had amputation of the toes on her left foot at the end of last year.  2 weeks ago she was diagnosed with wound infection and started on Bactrim.  She has been on the antibiotics since but the area has become more painful.  She reports that the area is open, and draining green foul-smelling substance.  She has not had any fevers.     Past Medical History:  Diagnosis Date  . CKD (chronic kidney disease) stage 5, GFR less than 15 ml/min (HCC) 06/19/2016   06/30/16: Seen by Dr. Posey Pronto [Nephrology]. Suspect etiologies to include diabetes and hypertension though cannot exclude HIV-associated nephropathy. Advised her against from using excessive Goody powder.  . Contact dermatitis 04/21/2016   07/29/16: Skin biopsy performed by [facility] notable for acute spongiotic dermatitis consistent with contact dermatitis.  . Cyclical vomiting    THC induced???  . Depression 06/28/2006   Qualifier: Diagnosis of  By: Riccardo Dubin MD, [place]    . Diabetes mellitus type 2 in obese ([place]) 06/28/1994  . DKA (diabetic ketoacidoses) ([place]) 12/2014  . Erosive esophagitis   . Esophageal reflux   . Essential hypertension 11/16/2013  . Gastroparesis    ? diabetic  . GERD (gastroesophageal reflux disease)   . Human immunodeficiency virus (HIV) disease ([place]) 04/23/2016  . Hypertension   . Moderate nonproliferative diabetic retinopathy of both eyes ([place]) 11/21/2014   11/14/14: Noted on retinal imaging; needs follow-up imaging in 6 months  05/22/16: Noted on retinal imaging again; needs follow-up imaging in 6 months  . Normocytic anemia     . TOBACCO USER 06/28/2006   Qualifier: Diagnosis of  By: Riccardo Dubin MD, Sherren Mocha    . Type II diabetes mellitus ([place]) 1994   diagnosed around 1994    Patient Active Problem List   Diagnosis Date Noted  . Osteomyelitis ([place]) 10/07/2018  . Adjustment disorder with mixed anxiety and depressed mood 08/15/2018  . Wound dehiscence s/p L transmetatarsal amputation   . Wound infection s/p L transmetatarsal amputation   . CKD (chronic kidney disease), stage V ([place])   . Acute on chronic anemia 07/03/2018  . Fecal incontinence 02/04/2018  . Healthcare maintenance 09/17/2017  . Right leg swelling 02/26/2017  . Superficial ulcerative lesion ([place]) 02/02/2017  . Aortic atherosclerosis ([place]) 09/30/2016  . Diabetic foot ulcer associated with diabetes mellitus due to underlying condition ([place]) 07/08/2016  . Human immunodeficiency virus (HIV) disease ([place]) 04/23/2016  . Gastroparesis   . Moderate nonproliferative diabetic retinopathy of both eyes ([place]) 11/21/2014  . Esophageal reflux   . Microscopic hematuria 10/31/2014  . Essential hypertension 11/16/2013  . TOBACCO USER 06/28/2006  . Depression 06/28/2006  . Diabetes mellitus type 2 in obese [facility]) 06/28/1994    Past Surgical History:  Procedure Laterality Date  . AMPUTATION Left 07/08/2018   Procedure: AMPUTATION FORTH RAY LEFT FOOT;  Surgeon: [name], MD;  Location: [place];  Service: Orthopedics;  Laterality: Left;  . AMPUTATION Left 08/09/2018   Procedure: Left Transmetatarsal Amputation;  Surgeon: [name], MD;  Location: [place];  Service: Orthopedics;  Laterality: Left;  . LOWER EXTREMITY ANGIOGRAPHY N/A 07/05/2018   Procedure: LOWER EXTREMITY ANGIOGRAPHY;  Surgeon: Serafina Mitchell, MD;  Location: [place] [facility];  Service: Cardiovascular;  Laterality: N/A;  . PERIPHERAL VASCULAR BALLOON ANGIOPLASTY Left 07/05/2018   Procedure: PERIPHERAL VASCULAR BALLOON ANGIOPLASTY;  Surgeon: Serafina Mitchell, MD;  Location: [place] [facility];  Service: Cardiovascular;  Laterality: Left;  SFA  . TUBAL LIGATION  2002     OB History   No obstetric history on file.      Home Medications    Prior to Admission medications   Medication Sig Start Date End Date Taking? Authorizing Provider  amLODipine (NORVASC) 10 MG tablet Take 1 tablet (10 mg total) by mouth daily. 12/24/17 10/07/18  Collier Salina, MD  aspirin 81 MG EC tablet Take 1 tablet (81 mg total) by mouth daily. 07/12/18   Mosetta Anis, MD  citalopram (CELEXA) 20 MG tablet Take 1 tablet (20 mg total) by mouth daily. 08/12/18 09/11/18  Carroll Sage, MD  Dolutegravir-Rilpivirine (JULUCA) 50-25 MG TABS Take 1 tablet by mouth daily. Please take with largest meal of the day. 07/05/18   Annia Belt, MD  glucose blood (ACCU-CHEK GUIDE) test strip USE TO TEST BLOOD SUGAR 3 TIMES DAILY . Diagnosis code  E11.69, E66.9 07/09/17   Lars Mage, MD  Insulin Glargine (LANTUS) 100 UNIT/ML Solostar Pen Inject 15 Units into the skin daily at 10 pm. Patient taking differently: Inject 15 Units into the skin at bedtime.  12/24/17   Rice, Resa Miner, MD  labetalol (NORMODYNE) 100 MG tablet TAKE 1 TABLET BY MOUTH 2 TIMES DAILY Patient taking differently: Take 100 mg by mouth 2 (two) times daily.  07/13/18   Chundi, Verne Spurr, MD  liraglutide (VICTOZA) 18 MG/3ML SOPN Inject 0.2 mLs (1.2 mg total) into the skin daily. 02/04/18   Lars Mage, MD  Multiple Vitamin (MULTIVITAMIN WITH MINERALS) TABS tablet Take 1 tablet by mouth daily. 12/24/17   Rice, Resa Miner, MD  ondansetron (ZOFRAN) 4 MG tablet Take 1 tablet (4 mg total) by mouth daily as needed for nausea or vomiting. 08/19/18 08/19/19  Alphonzo Grieve, MD  polyethylene glycol (MIRALAX / GLYCOLAX) packet Take 17 g by mouth daily. 08/12/18   Carroll Sage, MD  senna (SENOKOT) 8.6 MG TABS tablet Take 2 tablets (17.2 mg total) by mouth 2 (two) times daily. 08/12/18   Carroll Sage, MD  silver sulfADIAZINE (SILVADENE) 1 % cream Apply  1 application topically daily. 08/01/18   Rayburn, Neta Mends, PA-C  sodium bicarbonate 650 MG tablet Take 1 tablet (650 mg total) by mouth 2 (two) times daily. 12/24/17   Rice, Resa Miner, MD  sulfamethoxazole-trimethoprim (BACTRIM DS,SEPTRA DS) 800-160 MG tablet Take 1 tablet by mouth 2 (two) times daily. 09/15/18   Rayburn, Neta Mends, PA-C  triamcinolone cream (KENALOG) 0.1 % Apply 1 application topically 2 (two) times daily. Patient taking differently: Apply 1 application topically 2 (two) times daily as needed (itching).  02/04/18   Lars Mage, MD    Family History Family History  Problem Relation Age of Onset  . Diabetes Mother   . Diabetes Brother   . Diabetes Daughter   . Diabetes Daughter   . Mental retardation Brother        died from PNA  . Diabetes Maternal Grandmother     Social History Social History   Tobacco Use  . Smoking status: Former Smoker  Packs/day: 0.10    Years: 10.00    Pack years: 1.00    Types: Cigarettes    Start date: 11/17/2013    Last attempt to quit: 09/09/2014    Years since quitting: 4.0  . Smokeless tobacco: Never Used  Substance Use Topics  . Alcohol use: Yes    Comment: Occasionally.  . Drug use: Yes    Frequency: 4.0 times per week    Types: Marijuana     Allergies   Patient has no known allergies.   Review of Systems Review of Systems  Skin: Positive for wound.  All other systems reviewed and are negative.    Physical Exam Updated Vital Signs BP (!) 147/94 (BP Location: Left Arm)   Pulse 97   Temp 98.2 F (36.8 C) (Oral)   Resp 16   Ht 5\' 5"  (1.651 m)   Wt 72.6 kg   SpO2 100%   BMI 26.63 kg/m   Physical Exam Vitals signs and nursing note reviewed.  Constitutional:      General: She is not in acute distress.    Appearance: Normal appearance. She is well-developed.  HENT:     Head: Normocephalic and atraumatic.     Right Ear: Hearing normal.     Left Ear: Hearing normal.     Nose: Nose  normal.  Eyes:     Conjunctiva/sclera: Conjunctivae normal.     Pupils: Pupils are equal, round, and reactive to light.  Neck:     Musculoskeletal: Normal range of motion and neck supple.  Cardiovascular:     Rate and Rhythm: Regular rhythm.     Heart sounds: S1 normal and S2 normal. No murmur. No friction rub. No gallop.   Pulmonary:     Effort: Pulmonary effort is normal. No respiratory distress.     Breath sounds: Normal breath sounds.  Chest:     Chest wall: No tenderness.  Abdominal:     General: Bowel sounds are normal.     Palpations: Abdomen is soft.     Tenderness: There is no abdominal tenderness. There is no guarding or rebound. Negative signs include Murphy's sign and McBurney's sign.     Hernia: No hernia is present.  Musculoskeletal: Normal range of motion.     Comments: Prior transmetatarsal amputation left foot  Skin:    General: Skin is warm and dry.     Findings: No rash.     Comments: Transmetatarsal amputation incision open, draining greenish-white foul-smelling purulent material -mild amount of surrounding swelling and erythema  Neurological:     Mental Status: She is alert and oriented to person, place, and time.     GCS: GCS eye subscore is 4. GCS verbal subscore is 5. GCS motor subscore is 6.     Cranial Nerves: No cranial nerve deficit.     Sensory: No sensory deficit.     Coordination: Coordination normal.  Psychiatric:        Speech: Speech normal.        Behavior: Behavior normal.        Thought Content: Thought content normal.      ED Treatments / Results  Labs (all labs ordered are listed, but only abnormal results are displayed) Labs Reviewed  COMPREHENSIVE METABOLIC PANEL - Abnormal; Notable for the following components:      Result Value   Chloride 114 (*)    CO2 15 (*)    Glucose, Bld 267 (*)    BUN 53 (*)  Creatinine, Ser 6.71 (*)    Calcium 7.5 (*)    Albumin 3.2 (*)    AST 11 (*)    Total Bilirubin 0.1 (*)    GFR calc non Af  Amer 7 (*)    GFR calc Af Amer 8 (*)    All other components within normal limits  CBC WITH DIFFERENTIAL/PLATELET - Abnormal; Notable for the following components:   RBC 2.83 (*)    Hemoglobin 8.0 (*)    HCT 26.7 (*)    RDW 15.6 (*)    All other components within normal limits  CULTURE, BLOOD (ROUTINE X 2)  CULTURE, BLOOD (ROUTINE X 2)  AEROBIC CULTURE (SUPERFICIAL SPECIMEN)  LACTIC ACID, PLASMA  BETA-HYDROXYBUTYRIC ACID  SALICYLATE LEVEL  CALCIUM, IONIZED    EKG None  Radiology Dg Foot Complete Left  Result Date: 10/07/2018 CLINICAL DATA:  Drainage from stump, status post amputation December. EXAM: LEFT FOOT - COMPLETE 3+ VIEW COMPARISON:  LEFT foot radiograph August 06, 2018 FINDINGS: No fracture deformity or dislocation. Status post transmetatarsal amputation, slight osteolysis of the distal bone seen only on lateral radiograph. Associated skin ulcer and soft tissue swelling without subcutaneous gas or radiopaque foreign bodies. IMPRESSION: 1. Status post transmetatarsal amputation with recurrent suspected osteomyelitis associated with stump ulcer. Electronically Signed   By: Elon Alas M.D.   On: 10/07/2018 01:49    Procedures Procedures (including critical care time)  Medications Ordered in ED Medications  piperacillin-tazobactam (ZOSYN) IVPB 3.375 g (3.375 g Intravenous New Bag/Given 10/07/18 0339)  heparin injection 5,000 Units (has no administration in time range)  acetaminophen (TYLENOL) tablet 650 mg (has no administration in time range)    Or  acetaminophen (TYLENOL) suppository 650 mg (has no administration in time range)  senna-docusate (Senokot-S) tablet 1 tablet (has no administration in time range)  oxyCODONE (Oxy IR/ROXICODONE) immediate release tablet 5 mg (has no administration in time range)  Dolutegravir-Rilpivirine 50-25 MG TABS 1 tablet (has no administration in time range)  amLODipine (NORVASC) tablet 10 mg (has no administration in time range)   labetalol (NORMODYNE) tablet 100 mg (has no administration in time range)  Insulin Glargine (LANTUS) Solostar Pen 15 Units (has no administration in time range)  insulin aspart (novoLOG) injection 0-15 Units (has no administration in time range)  citalopram (CELEXA) tablet 20 mg (has no administration in time range)  vancomycin (VANCOCIN) IVPB 750 mg/150 ml premix (has no administration in time range)  ceFEPIme (MAXIPIME) 1 g in sodium chloride 0.9 % 100 mL IVPB (has no administration in time range)  sodium chloride flush (NS) 0.9 % injection 3 mL (3 mLs Intravenous Given 10/07/18 0109)  vancomycin (VANCOCIN) IVPB 1000 mg/200 mL premix (0 mg Intravenous Stopped 10/07/18 0327)  ondansetron (ZOFRAN) injection 4 mg (4 mg Intravenous Given 10/07/18 0229)  morphine 4 MG/ML injection 4 mg (4 mg Intravenous Given 10/07/18 0231)     Initial Impression / Assessment and Plan / ED Course  I have reviewed the triage vital signs and the nursing notes.  Pertinent labs & imaging results that were available during my care of the patient were reviewed by me and considered in my medical decision making (see chart for details).        Patient with recent transmetatarsal amputation of the foot presents with erythema, drainage and increased pain from the foot.  This is consistent with wound infection.  X-ray is concerning for osteomyelitis.  Patient initiated on Zosyn and vancomycin empirically.  Blood and wound cultures obtained.  Patient has mild hyperglycemia.  No evidence of serum ketones.  She does have some evidence of acidosis, likely uremia.  Will add on salicylate level, admit to internal medicine teaching service.  CRITICAL CARE Performed by: Orpah Greek   Total critical care time: 35 minutes  Critical care time was exclusive of separately billable procedures and treating other patients.  Critical care was necessary to treat or prevent imminent or life-threatening  deterioration.  Critical care was time spent personally by me on the following activities: development of treatment plan with patient and/or surrogate as well as nursing, discussions with consultants, evaluation of patient's response to treatment, examination of patient, obtaining history from patient or surrogate, ordering and performing treatments and interventions, ordering and review of laboratory studies, ordering and review of radiographic studies, pulse oximetry and re-evaluation of patient's condition.   Final Clinical Impressions(s) / ED Diagnoses   Final diagnoses:  Osteomyelitis of left foot, unspecified type [facility])    ED Discharge Orders    None       Orpah Greek, MD 10/07/18 [phone]

## 2018-10-07 NOTE — Consult Note (Signed)
ORTHOPAEDIC CONSULTATION  REQUESTING PHYSICIAN: Oda Kilts, MD  Chief Complaint: Dehiscence transmetatarsal amputation left foot with foul-smelling drainage.  HPI: Kathy Frank is a 43 y.o. female who presents with dehiscence of her left transmetatarsal amputation with foul-smelling drainage.  Patient underwent a left transmetatarsal amputation approximately 2 months ago.  Patient was lost to follow-up due to the fact that she left due to a death in the family.  Patient presents at this time with persistent ulceration foul-smelling drainage.  Past Medical History:  Diagnosis Date  . CKD (chronic kidney disease) stage 5, GFR less than 15 ml/min (HCC) 06/19/2016   06/30/16: Seen by Dr. Posey Pronto [Nephrology]. Suspect etiologies to include diabetes and hypertension though cannot exclude HIV-associated nephropathy. Advised her against from using excessive Goody powder.  . Contact dermatitis 04/21/2016   07/29/16: Skin biopsy performed by [facility] notable for acute spongiotic dermatitis consistent with contact dermatitis.  . Cyclical vomiting    THC induced???  . Depression 06/28/2006   Qualifier: Diagnosis of  By: Riccardo Dubin MD, Todd    . Diabetes mellitus type 2 in obese ([place]) 06/28/1994  . DKA (diabetic ketoacidoses) ([place]) 12/2014  . Erosive esophagitis   . Esophageal reflux   . Essential hypertension 11/16/2013  . Gastroparesis    ? diabetic  . GERD (gastroesophageal reflux disease)   . Human immunodeficiency virus (HIV) disease ([place]) 04/23/2016  . Hypertension   . Moderate nonproliferative diabetic retinopathy of both eyes ([place]) 11/21/2014   11/14/14: Noted on retinal imaging; needs follow-up imaging in 6 months  05/22/16: Noted on retinal imaging again; needs follow-up imaging in 6 months  . Normocytic anemia   . TOBACCO USER 06/28/2006   Qualifier: Diagnosis of  By: Riccardo Dubin MD, Sherren Mocha    . Type II diabetes mellitus ([place]) 1994   diagnosed around 1994   Past  Surgical History:  Procedure Laterality Date  . AMPUTATION Left 07/08/2018   Procedure: AMPUTATION FORTH RAY LEFT FOOT;  Surgeon: [name], MD;  Location: [facility];  Service: Orthopedics;  Laterality: Left;  . AMPUTATION Left 08/09/2018   Procedure: Left Transmetatarsal Amputation;  Surgeon: [name], MD;  Location: [facility];  Service: Orthopedics;  Laterality: Left;  . LOWER EXTREMITY ANGIOGRAPHY N/A 07/05/2018   Procedure: LOWER EXTREMITY ANGIOGRAPHY;  Surgeon: Serafina Mitchell, MD;  Location: [facility];  Service: Cardiovascular;  Laterality: N/A;  . PERIPHERAL VASCULAR BALLOON ANGIOPLASTY Left 07/05/2018   Procedure: PERIPHERAL VASCULAR BALLOON ANGIOPLASTY;  Surgeon: Serafina Mitchell, MD;  Location: [facility];  Service: Cardiovascular;  Laterality: Left;  SFA  . TUBAL LIGATION  2002   Social History   Socioeconomic History  . Marital status: Single    Spouse name: Not on file  . Number of children: Not on file  . Years of education: 18  . Highest education level: Not on file  Occupational History    Employer: UNEMPLOYED  Social Needs  . Financial resource strain: Not on file  . Food insecurity:    Worry: Not on file    Inability: Not on file  . Transportation needs:    Medical: Not on file    Non-medical: Not on file  Tobacco Use  . Smoking status: Former Smoker    Packs/day: 0.10    Years: 10.00    Pack years: 1.00    Types: Cigarettes    Start date: 11/17/2013    Last attempt to quit: 09/09/2014  Years since quitting: 4.0  . Smokeless tobacco: Never Used  Substance and Sexual Activity  . Alcohol use: Yes    Comment: Occasionally.  . Drug use: Yes    Frequency: 4.0 times per week    Types: Marijuana  . Sexual activity: Not on file  Lifestyle  . Physical activity:    Days per week: Not on file    Minutes per session: Not on file  . Stress: Not on file  Relationships  . Social connections:    Talks on phone: Not on file    Gets  together: Not on file    Attends religious service: Not on file    Active member of club or organization: Not on file    Attends meetings of clubs or organizations: Not on file    Relationship status: Not on file  Other Topics Concern  . Not on file  Social History Narrative  . Not on file   Family History  Problem Relation Age of Onset  . Diabetes Mother   . Diabetes Brother   . Diabetes Daughter   . Diabetes Daughter   . Mental retardation Brother        died from PNA  . Diabetes Maternal Grandmother    - negative except otherwise stated in the family history section No Known Allergies Prior to Admission medications   Medication Sig Start Date End Date Taking? Authorizing Provider  amLODipine (NORVASC) 10 MG tablet Take 1 tablet (10 mg total) by mouth daily. 12/24/17 10/07/18 Yes Rice, Resa Miner, MD  aspirin 81 MG EC tablet Take 1 tablet (81 mg total) by mouth daily. 07/12/18  Yes Mosetta Anis, MD  citalopram (CELEXA) 20 MG tablet Take 1 tablet (20 mg total) by mouth daily. 08/12/18 10/07/18 Yes Carroll Sage, MD  Dolutegravir-Rilpivirine (JULUCA) 50-25 MG TABS Take 1 tablet by mouth daily. Please take with largest meal of the day. 07/05/18  Yes Annia Belt, MD  Insulin Glargine (LANTUS) 100 UNIT/ML Solostar Pen Inject 15 Units into the skin daily at 10 pm. Patient taking differently: Inject 15 Units into the skin at bedtime.  12/24/17  Yes Rice, Resa Miner, MD  labetalol (NORMODYNE) 100 MG tablet TAKE 1 TABLET BY MOUTH 2 TIMES DAILY Patient taking differently: Take 100 mg by mouth 2 (two) times daily.  07/13/18  Yes Chundi, Vahini, MD  liraglutide (VICTOZA) 18 MG/3ML SOPN Inject 0.2 mLs (1.2 mg total) into the skin daily. 02/04/18  Yes Chundi, Vahini, MD  Multiple Vitamin (MULTIVITAMIN WITH MINERALS) TABS tablet Take 1 tablet by mouth daily. 12/24/17  Yes Rice, Resa Miner, MD  ondansetron (ZOFRAN) 4 MG tablet Take 1 tablet (4 mg total) by mouth daily as needed for  nausea or vomiting. 08/19/18 08/19/19 Yes Alphonzo Grieve, MD  oxyCODONE (OXY IR/ROXICODONE) 5 MG immediate release tablet TAKE 1 TABLET (5 MG TOTAL) BY MOUTH EVERY 6 (SIX) HOURS AS NEEDED FOR UP TO 7 DAYS FOR MODERATE PAIN OR SEVERE PAIN. 09/29/18 10/07/18 Yes Chundi, Vahini, MD  polyethylene glycol (MIRALAX / GLYCOLAX) packet Take 17 g by mouth daily. Patient taking differently: Take 17 g by mouth daily as needed for mild constipation.  08/12/18  Yes Carroll Sage, MD  senna (SENOKOT) 8.6 MG TABS tablet Take 2 tablets (17.2 mg total) by mouth 2 (two) times daily. Patient taking differently: Take 2 tablets by mouth daily as needed for mild constipation.  08/12/18  Yes Carroll Sage, MD  silver sulfADIAZINE (SILVADENE) 1 %  cream Apply 1 application topically daily. 08/01/18  Yes Rayburn, Neta Mends, PA-C  sodium bicarbonate 650 MG tablet Take 1 tablet (650 mg total) by mouth 2 (two) times daily. 12/24/17  Yes Rice, Resa Miner, MD  triamcinolone cream (KENALOG) 0.1 % Apply 1 application topically 2 (two) times daily. Patient taking differently: Apply 1 application topically 2 (two) times daily as needed (itching).  02/04/18  Yes Chundi, Vahini, MD  glucose blood (ACCU-CHEK GUIDE) test strip USE TO TEST BLOOD SUGAR 3 TIMES DAILY . Diagnosis code  E11.69, E66.9 07/09/17   Lars Mage, MD  sulfamethoxazole-trimethoprim (BACTRIM DS,SEPTRA DS) 800-160 MG tablet Take 1 tablet by mouth 2 (two) times daily. Patient not taking: Reported on 10/07/2018 09/15/18   Rayburn, Neta Mends, PA-C   Dg Foot Complete Left  Result Date: 10/07/2018 CLINICAL DATA:  Drainage from stump, status post amputation December. EXAM: LEFT FOOT - COMPLETE 3+ VIEW COMPARISON:  LEFT foot radiograph August 06, 2018 FINDINGS: No fracture deformity or dislocation. Status post transmetatarsal amputation, slight osteolysis of the distal bone seen only on lateral radiograph. Associated skin ulcer and soft tissue swelling without  subcutaneous gas or radiopaque foreign bodies. IMPRESSION: 1. Status post transmetatarsal amputation with recurrent suspected osteomyelitis associated with stump ulcer. Electronically Signed   By: Elon Alas M.D.   On: 10/07/2018 01:49   - pertinent xrays, CT, MRI studies were reviewed and independently interpreted  Positive ROS: All other systems have been reviewed and were otherwise negative with the exception of those mentioned in the HPI and as above.  Physical Exam: General: Alert, no acute distress Psychiatric: Patient is competent for consent with normal mood and affect Lymphatic: No axillary or cervical lymphadenopathy Cardiovascular: No pedal edema Respiratory: No cyanosis, no use of accessory musculature GI: No organomegaly, abdomen is soft and non-tender    Images:  @ENCIMAGES @  Labs:  Lab Results  Component Value Date   HGBA1C 6.5 (H) 08/06/2018   HGBA1C 7.9 (A) 02/04/2018   HGBA1C 9.0 (H) 09/13/2017   ESRSEDRATE 21 08/06/2018   ESRSEDRATE 85 (H) 02/02/2017   CRP <0.8 10/07/2018   CRP <0.8 08/06/2018   CRP 42.4 (H) 02/02/2017   REPTSTATUS PENDING 10/07/2018   REPTSTATUS PENDING 10/07/2018   GRAMSTAIN  10/07/2018    NO WBC SEEN RARE GRAM POSITIVE COCCI IN PAIRS FEW GRAM POSITIVE RODS Performed at [facility], [place] [address] [place], [place] [address]    CULT NO GROWTH < 12 HOURS 10/07/2018   CULT PENDING 10/07/2018   LABORGA STAPHYLOCOCCUS AUREUS 07/08/2018    Lab Results  Component Value Date   ALBUMIN 3.2 (L) 10/06/2018   ALBUMIN 2.7 (L) 08/11/2018   ALBUMIN 2.6 (L) 08/07/2018    Neurologic: Patient does not have protective sensation bilateral lower extremities.   MUSCULOSKELETAL:   Skin: Examination there is dehiscence of the transmetatarsal amputation this probes down to bone there is necrotic tissue changes.  Patient has a good dorsalis pedis pulse.  There is no ascending cellulitis she does have  swelling.  Assessment: Assessment: Diabetic insensate neuropathy with moderate protein caloric malnutrition with osteomyelitis and wound dehiscence of her left transmetatarsal amputation.  Plan: Discussed with the patient she does not have any foot salvage options despite the fact that she has a good dorsalis pedis pulse she has severe microcirculatory disease.  Recommended proceeding with a transtibial amputation risks of complications and wound healing were discussed.  Patient states she understands wished to proceed at this time plan for surgery  tomorrow morning Saturday.  Thank you for the consult and the opportunity to see Ms. Princess Perna, [place] [phone] 3:31 PM

## 2018-10-07 NOTE — Consult Note (Signed)
Reason for Consult:Left foot infection Referring Physician: A Joette Catching is an 43 y.o. female.  HPI: Kathy Frank came to the hospital with continued problems with her left foot. She underwent TMT amputation by Dr. Sharol Given 12/31. She developed wound issues and was set to have a revision but had a death in the family and had to cancel. She has minimal pain but copious foul-odored discharge. She denies fevers, chills or sweats but had some nausea and vomiting today.  Past Medical History:  Diagnosis Date  . CKD (chronic kidney disease) stage 5, GFR less than 15 ml/min (HCC) 06/19/2016   06/30/16: Seen by Dr. Posey Pronto [Nephrology]. Suspect etiologies to include diabetes and hypertension though cannot exclude HIV-associated nephropathy. Advised her against from using excessive Goody powder.  . Contact dermatitis 04/21/2016   07/29/16: Skin biopsy performed by [facility] notable for acute spongiotic dermatitis consistent with contact dermatitis.  . Cyclical vomiting    THC induced???  . Depression 06/28/2006   Qualifier: Diagnosis of  By: Riccardo Dubin MD, [place]    . Diabetes mellitus type 2 in obese ([place]) 06/28/1994  . DKA (diabetic ketoacidoses) ([place]) 12/2014  . Erosive esophagitis   . Esophageal reflux   . Essential hypertension 11/16/2013  . Gastroparesis    ? diabetic  . GERD (gastroesophageal reflux disease)   . Human immunodeficiency virus (HIV) disease ([place]) 04/23/2016  . Hypertension   . Moderate nonproliferative diabetic retinopathy of both eyes ([place]) 11/21/2014   11/14/14: Noted on retinal imaging; needs follow-up imaging in 6 months  05/22/16: Noted on retinal imaging again; needs follow-up imaging in 6 months  . Normocytic anemia   . TOBACCO USER 06/28/2006   Qualifier: Diagnosis of  By: Riccardo Dubin MD, Sherren Mocha    . Type II diabetes mellitus ([place]) 1994   diagnosed around 1994    Past Surgical History:  Procedure Laterality Date  . AMPUTATION Left 07/08/2018   Procedure:  AMPUTATION FORTH RAY LEFT FOOT;  Surgeon: [name], MD;  Location: [facility];  Service: Orthopedics;  Laterality: Left;  . AMPUTATION Left 08/09/2018   Procedure: Left Transmetatarsal Amputation;  Surgeon: [name], MD;  Location: [facility];  Service: Orthopedics;  Laterality: Left;  . LOWER EXTREMITY ANGIOGRAPHY N/A 07/05/2018   Procedure: LOWER EXTREMITY ANGIOGRAPHY;  Surgeon: Serafina Mitchell, MD;  Location: [facility];  Service: Cardiovascular;  Laterality: N/A;  . PERIPHERAL VASCULAR BALLOON ANGIOPLASTY Left 07/05/2018   Procedure: PERIPHERAL VASCULAR BALLOON ANGIOPLASTY;  Surgeon: Serafina Mitchell, MD;  Location: [facility];  Service: Cardiovascular;  Laterality: Left;  SFA  . TUBAL LIGATION  2002    Family History  Problem Relation Age of Onset  . Diabetes Mother   . Diabetes Brother   . Diabetes Daughter   . Diabetes Daughter   . Mental retardation Brother        died from PNA  . Diabetes Maternal Grandmother     Social History:  reports that she quit smoking about 4 years ago. Her smoking use included cigarettes. She started smoking about 4 years ago. She has a 1.00 pack-year smoking history. She has never used smokeless tobacco. She reports current alcohol use. She reports current drug use. Frequency: 4.00 times per week. Drug: Marijuana.  Allergies: No Known Allergies  Medications: I have reviewed the patient's current medications.  Results for orders placed or performed during the hospital encounter of 10/06/18 (from the past 48 hour(s))  Comprehensive metabolic panel  Status: Abnormal   Collection Time: 10/06/18 10:22 PM  Result Value Ref Range   Sodium 138 135 - 145 mmol/L   Potassium 4.9 3.5 - 5.1 mmol/L   Chloride 114 (H) 98 - 111 mmol/L   CO2 15 (L) 22 - 32 mmol/L   Glucose, Bld 267 (H) 70 - 99 mg/dL   BUN 53 (H) 6 - 20 mg/dL   Creatinine, Ser 6.71 (H) 0.44 - 1.00 mg/dL   Calcium 7.5 (L) 8.9 - 10.3 mg/dL   Total Protein 7.5 6.5 - 8.1 g/dL    Albumin 3.2 (L) 3.5 - 5.0 g/dL   AST 11 (L) 15 - 41 U/L   ALT 8 0 - 44 U/L   Alkaline Phosphatase 101 38 - 126 U/L   Total Bilirubin 0.1 (L) 0.3 - 1.2 mg/dL   GFR calc non Af Amer 7 (L) >60 mL/min   GFR calc Af Amer 8 (L) >60 mL/min   Anion gap 9 5 - 15    Comment: Performed at [facility], [place] [address] [place], [place] [address]  CBC with Differential     Status: Abnormal   Collection Time: 10/06/18 10:22 PM  Result Value Ref Range   WBC 7.6 4.0 - 10.5 K/uL   RBC 2.83 (L) 3.87 - 5.11 MIL/uL   Hemoglobin 8.0 (L) 12.0 - 15.0 g/dL   HCT 26.7 (L) 36.0 - 46.0 %   MCV 94.3 80.0 - 100.0 fL   MCH 28.3 26.0 - 34.0 pg   MCHC 30.0 30.0 - 36.0 g/dL   RDW 15.6 (H) 11.5 - 15.5 %   Platelets 278 150 - 400 K/uL   nRBC 0.0 0.0 - 0.2 %   Neutrophils Relative % 64 %   Neutro Abs 4.8 1.7 - 7.7 K/uL   Lymphocytes Relative 22 %   Lymphs Abs 1.7 0.7 - 4.0 K/uL   Monocytes Relative 7 %   Monocytes Absolute 0.6 0.1 - 1.0 K/uL   Eosinophils Relative 6 %   Eosinophils Absolute 0.5 0.0 - 0.5 K/uL   Basophils Relative 1 %   Basophils Absolute 0.1 0.0 - 0.1 K/uL   Immature Granulocytes 0 %   Abs Immature Granulocytes 0.02 0.00 - 0.07 K/uL    Comment: Performed at [facility] [address] [place], [place] [address]  Lactic acid, plasma     Status: None   Collection Time: 10/07/18  1:10 AM  Result Value Ref Range   Lactic Acid, Venous 1.2 0.5 - 1.9 mmol/L    Comment: Performed at [facility] [address], [place], [place] [address]  Culture, blood (Routine X 2) w Reflex to ID Panel     Status: None (Preliminary result)   Collection Time: 10/07/18  1:10 AM  Result Value Ref Range   Specimen Description BLOOD RIGHT ARM    Special Requests      BOTTLES DRAWN AEROBIC AND ANAEROBIC Blood Culture adequate volume Performed at [facility], [place] [address], [place], [place] [address]    Culture NO GROWTH < 12 HOURS    Report Status PENDING   Beta-hydroxybutyric acid      Status: None   Collection Time: 10/07/18  1:44 AM  Result Value Ref Range   Beta-Hydroxybutyric Acid 0.05 0.05 - 0.27 mmol/L    Comment: Performed at [facility], [place] [address] [place], [place] [address]  Culture, blood (Routine X 2) w Reflex to ID Panel  Status: None (Preliminary result)   Collection Time: 10/07/18  2:11 AM  Result Value Ref Range   Specimen Description BLOOD RIGHT WRIST    Special Requests      BOTTLES DRAWN AEROBIC AND ANAEROBIC Blood Culture adequate volume Performed at [facility], [place] [address] [place], [place] [address]    Culture NO GROWTH < 12 HOURS    Report Status PENDING   Wound or Superficial Culture     Status: None (Preliminary result)   Collection Time: 10/07/18  2:11 AM  Result Value Ref Range   Specimen Description WOUND FOOT    Special Requests NONE    Gram Stain      NO WBC SEEN RARE GRAM POSITIVE COCCI IN PAIRS FEW GRAM POSITIVE RODS Performed at [facility], [place] [address] [place], [place] [address]    Culture PENDING    Report Status PENDING   Salicylate level     Status: None   Collection Time: 10/07/18  3:38 AM  Result Value Ref Range   Salicylate Lvl <1.4 2.8 - 30.0 mg/dL    Comment: Performed at [facility] [address] , [place] [address]  POCT I-Stat EG7     Status: Abnormal   Collection Time: 10/07/18  4:03 AM  Result Value Ref Range   pH, Ven 7.301 7.250 - 7.430   pCO2, Ven 29.3 (L) 44.0 - 60.0 mmHg   pO2, Ven 78.0 (H) 32.0 - 45.0 mmHg   Bicarbonate 14.5 (L) 20.0 - 28.0 mmol/L   TCO2 15 (L) 22 - 32 mmol/L   O2 Saturation 94.0 %   Acid-base deficit 11.0 (H) 0.0 - 2.0 mmol/L   Sodium 139 135 - 145 mmol/L   Potassium 4.7 3.5 - 5.1 mmol/L   Calcium, Ion 1.04 (L) 1.15 - 1.40 mmol/L   HCT 33.0 (L) 36.0 - 46.0 %   Hemoglobin 11.2 (L) 12.0 - 15.0 g/dL   Patient temperature HIDE    Sample type VENOUS   Glucose, capillary     Status: Abnormal   Collection Time: 10/07/18  7:53 AM   Result Value Ref Range   Glucose-Capillary 112 (H) 70 - 99 mg/dL  C-reactive protein     Status: None   Collection Time: 10/07/18 11:12 AM  Result Value Ref Range   CRP <0.8 <1.0 mg/dL    Comment: Performed at [facility], [address] [place], [place] [address]    Dg Foot Complete Left  Result Date: 10/07/2018 CLINICAL DATA:  Drainage from stump, status post amputation December. EXAM: LEFT FOOT - COMPLETE 3+ VIEW COMPARISON:  LEFT foot radiograph August 06, 2018 FINDINGS: No fracture deformity or dislocation. Status post transmetatarsal amputation, slight osteolysis of the distal bone seen only on lateral radiograph. Associated skin ulcer and soft tissue swelling without subcutaneous gas or radiopaque foreign bodies. IMPRESSION: 1. Status post transmetatarsal amputation with recurrent suspected osteomyelitis associated with stump ulcer. Electronically Signed   By: Elon Alas M.D.   On: 10/07/2018 01:49    Review of Systems  Constitutional: Negative for chills, fever and weight loss.  HENT: Negative for ear discharge, ear pain, hearing loss and tinnitus.   Eyes: Negative for blurred vision, double vision, photophobia and pain.  Respiratory: Negative for cough, sputum production and shortness of breath.   Cardiovascular: Negative for chest pain.  Gastrointestinal: Positive for nausea and vomiting. Negative for abdominal pain.  Genitourinary: Negative for dysuria, flank pain, frequency and urgency.  Musculoskeletal: Positive for joint pain (Left foot, minimal). Negative for back pain, falls, myalgias and neck pain.  Neurological: Negative for dizziness, tingling, sensory change, focal weakness, loss of consciousness and headaches.  Endo/Heme/Allergies: Does not bruise/bleed easily.  Psychiatric/Behavioral: Negative for depression, memory loss and substance abuse. The patient is not nervous/anxious.    Blood pressure 137/69, pulse 90, temperature 98.4 F (36.9 C),  temperature source Oral, resp. rate 17, height 5\' 5"  (1.651 m), weight 72.6 kg, SpO2 100 %. Physical Exam  Constitutional: She appears well-developed and well-nourished. No distress.  HENT:  Head: Normocephalic and atraumatic.  Eyes: Conjunctivae are normal. Right eye exhibits no discharge. Left eye exhibits no discharge. No scleral icterus.  Neck: Normal range of motion.  Cardiovascular: Normal rate and regular rhythm.  Respiratory: Effort normal. No respiratory distress.  Musculoskeletal:     Comments: LLE No traumatic wounds, ecchymosis, or rash  Medial portion of TMT incision with dehiscence, malodorous discharge  No knee or ankle effusion  Knee stable to varus/ valgus and anterior/posterior stress  Sens impaired  Motor ext, flex, evers 5/5  DP 2+, PT 1+, No significant edema  Neurological: She is alert.  Skin: Skin is warm and dry. She is not diaphoretic.  Psychiatric: She has a normal mood and affect. Her behavior is normal.    Assessment/Plan: Left foot diabetic ulcer with osteomyelitis -- Will need revision vs BKA at this point. Dr. Sharol Given to evaluate later today. Multiple medical problems including diabetes, HTN, CKD stage 5 and HIV -- per primary service    Lisette Abu, PA-C Orthopedic Surgery [phone] 10/07/2018, 1:04 PM

## 2018-10-07 NOTE — Progress Notes (Signed)
Pharmacy Antibiotic Note  Kathy Frank is a 43 y.o. female admitted on 10/06/2018 with wound infection.  Pharmacy has been consulted for Vancomycin/Cefepime dosing. WBC WNL. ESRD but not on HD yet.   Plan: Vancomycin 750 mg IV q48h Cefepime 1g IV q24h Trend WBC, temp, renal function  F/U infectious work-up Drug levels as indicated   Height: 5\' 5"  (165.1 cm) Weight: 160 lb (72.6 kg) IBW/kg (Calculated) : 57  Temp (24hrs), Avg:98.2 F (36.8 C), Min:98.2 F (36.8 C), Max:98.2 F (36.8 C)  Recent Labs  Lab 10/06/18 2222 10/07/18 0110  WBC 7.6  --   CREATININE 6.71*  --   LATICACIDVEN  --  1.2    Estimated Creatinine Clearance: 11 mL/min (A) (by C-G formula based on SCr of 6.71 mg/dL (H)).    No Known Allergies  Kathy Frank 10/07/2018 3:52 AM

## 2018-10-08 ENCOUNTER — Inpatient Hospital Stay (HOSPITAL_COMMUNITY): Payer: Medicaid Other | Admitting: Anesthesiology

## 2018-10-08 ENCOUNTER — Encounter (HOSPITAL_COMMUNITY): Payer: Self-pay | Admitting: Certified Registered Nurse Anesthetist

## 2018-10-08 ENCOUNTER — Encounter (HOSPITAL_COMMUNITY)
Admission: EM | Disposition: A | Discharge status: Home / Self Care | Payer: Self-pay | Source: Home / Self Care | Attending: Internal Medicine

## 2018-10-08 DIAGNOSIS — M86272 Subacute osteomyelitis, left ankle and foot: Secondary | ICD-10-CM

## 2018-10-08 DIAGNOSIS — E1169 Type 2 diabetes mellitus with other specified complication: Secondary | ICD-10-CM

## 2018-10-08 DIAGNOSIS — E669 Obesity, unspecified: Secondary | ICD-10-CM

## 2018-10-08 DIAGNOSIS — T8781 Dehiscence of amputation stump: Secondary | ICD-10-CM

## 2018-10-08 HISTORY — PX: AMPUTATION: SHX166

## 2018-10-08 LAB — BASIC METABOLIC PANEL
Anion gap: 9 (ref 5–15)
BUN: 45 mg/dL — ABNORMAL HIGH (ref 6–20)
CO2: 13 mmol/L — ABNORMAL LOW (ref 22–32)
Calcium: 7.7 mg/dL — ABNORMAL LOW (ref 8.9–10.3)
Chloride: 113 mmol/L — ABNORMAL HIGH (ref 98–111)
Creatinine, Ser: 6.24 mg/dL — ABNORMAL HIGH (ref 0.44–1.00)
GFR calc Af Amer: 9 mL/min — ABNORMAL LOW (ref >60)
GFR calc non Af Amer: 8 mL/min — ABNORMAL LOW (ref >60)
Glucose, Bld: 117 mg/dL — ABNORMAL HIGH (ref 70–99)
Potassium: 5.5 mmol/L — ABNORMAL HIGH (ref 3.5–5.1)
Sodium: 135 mmol/L (ref 135–145)

## 2018-10-08 LAB — CBC
HCT: 23.6 % — ABNORMAL LOW (ref 36.0–46.0)
Hemoglobin: 7.3 g/dL — ABNORMAL LOW (ref 12.0–15.0)
MCH: 28.6 pg (ref 26.0–34.0)
MCHC: 30.9 g/dL (ref 30.0–36.0)
MCV: 92.5 fL (ref 80.0–100.0)
Platelets: 260 10*3/uL (ref 150–400)
RBC: 2.55 MIL/uL — ABNORMAL LOW (ref 3.87–5.11)
RDW: 15.8 % — ABNORMAL HIGH (ref 11.5–15.5)
WBC: 6.2 10*3/uL (ref 4.0–10.5)
nRBC: 0 % (ref 0.0–0.2)

## 2018-10-08 LAB — GLUCOSE, CAPILLARY
Glucose-Capillary: 103 mg/dL — ABNORMAL HIGH (ref 70–99)
Glucose-Capillary: 134 mg/dL — ABNORMAL HIGH (ref 70–99)
Glucose-Capillary: 154 mg/dL — ABNORMAL HIGH (ref 70–99)
Glucose-Capillary: 155 mg/dL — ABNORMAL HIGH (ref 70–99)

## 2018-10-08 LAB — CALCIUM, IONIZED: Calcium, Ionized, Serum: 4.1 mg/dL — ABNORMAL LOW (ref 4.5–5.6)

## 2018-10-08 SURGERY — AMPUTATION BELOW KNEE
Anesthesia: General | Site: Leg Lower | Laterality: Left

## 2018-10-08 MED ORDER — PROPOFOL 10 MG/ML IV BOLUS
INTRAVENOUS | Status: DC | PRN
  Administered 2018-10-08: 150 mg via INTRAVENOUS
  Administered 2018-10-08 (×2): 15 mg via INTRAVENOUS

## 2018-10-08 MED ORDER — METOCLOPRAMIDE HCL 5 MG PO TABS
5.0000 mg | ORAL_TABLET | Freq: Three times a day (TID) | ORAL | Status: DC | PRN
  Administered 2018-10-09: 5 mg via ORAL
  Administered 2018-10-11: 10 mg via ORAL
  Filled 2018-10-08: qty 1
  Filled 2018-10-08: qty 2

## 2018-10-08 MED ORDER — METOCLOPRAMIDE HCL 5 MG/ML IJ SOLN: 5.0000 mg | Freq: Three times a day (TID) | INTRAMUSCULAR | Status: DC | PRN

## 2018-10-08 MED ORDER — FENTANYL CITRATE (PF) 100 MCG/2ML IJ SOLN: 25.0000 ug | INTRAMUSCULAR | Status: DC | PRN

## 2018-10-08 MED ORDER — MIDAZOLAM HCL 2 MG/2ML IJ SOLN
INTRAMUSCULAR | Status: AC
  Administered 2018-10-08: 1 mg via INTRAVENOUS
  Filled 2018-10-08: qty 2

## 2018-10-08 MED ORDER — OXYCODONE HCL 5 MG/5ML PO SOLN: 5.0000 mg | Freq: Once | ORAL | Status: AC | PRN

## 2018-10-08 MED ORDER — BISACODYL 10 MG RE SUPP: 10.0000 mg | Freq: Every day | RECTAL | Status: DC | PRN

## 2018-10-08 MED ORDER — FENTANYL CITRATE (PF) 100 MCG/2ML IJ SOLN
INTRAMUSCULAR | Status: AC
  Administered 2018-10-08: 50 ug via INTRAVENOUS
  Filled 2018-10-08: qty 2

## 2018-10-08 MED ORDER — FENTANYL CITRATE (PF) 250 MCG/5ML IJ SOLN
INTRAMUSCULAR | Status: AC
  Filled 2018-10-08: qty 5

## 2018-10-08 MED ORDER — DOCUSATE SODIUM 100 MG PO CAPS
100.0000 mg | ORAL_CAPSULE | Freq: Two times a day (BID) | ORAL | Status: DC
  Administered 2018-10-08 – 2018-10-16 (×16): 100 mg via ORAL
  Filled 2018-10-08 (×17): qty 1

## 2018-10-08 MED ORDER — MIDAZOLAM HCL 2 MG/2ML IJ SOLN
INTRAMUSCULAR | Status: DC | PRN
  Administered 2018-10-08 (×2): 1 mg via INTRAVENOUS

## 2018-10-08 MED ORDER — INFLUENZA VAC SPLIT QUAD 0.5 ML IM SUSY
0.5000 mL | PREFILLED_SYRINGE | INTRAMUSCULAR | Status: AC
  Administered 2018-10-09: 0.5 mL via INTRAMUSCULAR
  Filled 2018-10-08: qty 0.5

## 2018-10-08 MED ORDER — HYDROMORPHONE HCL 1 MG/ML IJ SOLN
0.5000 mg | Freq: Four times a day (QID) | INTRAMUSCULAR | Status: DC | PRN
  Administered 2018-10-08 – 2018-10-09 (×2): 0.5 mg via INTRAVENOUS
  Filled 2018-10-08 (×3): qty 1

## 2018-10-08 MED ORDER — POLYETHYLENE GLYCOL 3350 17 G PO PACK: 17.0000 g | PACK | Freq: Every day | ORAL | Status: DC | PRN

## 2018-10-08 MED ORDER — SODIUM CHLORIDE 0.9 % IV SOLN
INTRAVENOUS | Status: DC
  Administered 2018-10-08: 12:00:00 via INTRAVENOUS

## 2018-10-08 MED ORDER — ONDANSETRON HCL 4 MG PO TABS
4.0000 mg | ORAL_TABLET | Freq: Four times a day (QID) | ORAL | Status: DC | PRN
  Administered 2018-10-12 – 2018-10-15 (×4): 4 mg via ORAL
  Filled 2018-10-08 (×6): qty 1

## 2018-10-08 MED ORDER — METHOCARBAMOL 1000 MG/10ML IJ SOLN
500.0000 mg | Freq: Four times a day (QID) | INTRAVENOUS | Status: DC | PRN
  Administered 2018-10-09: 500 mg via INTRAVENOUS
  Filled 2018-10-08 (×2): qty 5

## 2018-10-08 MED ORDER — ONDANSETRON HCL 4 MG/2ML IJ SOLN
INTRAMUSCULAR | Status: AC
  Filled 2018-10-08: qty 2

## 2018-10-08 MED ORDER — OXYCODONE HCL 5 MG PO TABS
ORAL_TABLET | ORAL | Status: AC
  Administered 2018-10-08: 5 mg
  Filled 2018-10-08: qty 2

## 2018-10-08 MED ORDER — LIDOCAINE 2% (20 MG/ML) 5 ML SYRINGE
INTRAMUSCULAR | Status: AC
  Filled 2018-10-08: qty 15

## 2018-10-08 MED ORDER — METHOCARBAMOL 500 MG PO TABS
ORAL_TABLET | ORAL | Status: AC
  Administered 2018-10-09: 500 mg via ORAL
  Filled 2018-10-08: qty 1

## 2018-10-08 MED ORDER — SODIUM CHLORIDE 0.9 % IV SOLN
INTRAVENOUS | Status: DC
  Administered 2018-10-08 – 2018-10-14 (×3): via INTRAVENOUS

## 2018-10-08 MED ORDER — ONDANSETRON HCL 4 MG/2ML IJ SOLN: 4.0000 mg | Freq: Once | INTRAMUSCULAR | Status: DC | PRN

## 2018-10-08 MED ORDER — FENTANYL CITRATE (PF) 250 MCG/5ML IJ SOLN
INTRAMUSCULAR | Status: DC | PRN
  Administered 2018-10-08: 50 ug via INTRAVENOUS

## 2018-10-08 MED ORDER — PROPOFOL 500 MG/50ML IV EMUL
INTRAVENOUS | Status: DC | PRN
  Administered 2018-10-08: 75 ug/kg/min via INTRAVENOUS

## 2018-10-08 MED ORDER — 0.9 % SODIUM CHLORIDE (POUR BTL) OPTIME
TOPICAL | Status: DC | PRN
  Administered 2018-10-08: 1000 mL

## 2018-10-08 MED ORDER — METHOCARBAMOL 500 MG PO TABS
500.0000 mg | ORAL_TABLET | Freq: Four times a day (QID) | ORAL | Status: DC | PRN
  Administered 2018-10-08 – 2018-10-16 (×12): 500 mg via ORAL
  Filled 2018-10-08 (×11): qty 1

## 2018-10-08 MED ORDER — MIDAZOLAM HCL 2 MG/2ML IJ SOLN
1.0000 mg | Freq: Once | INTRAMUSCULAR | Status: AC
  Administered 2018-10-08: 1 mg via INTRAVENOUS

## 2018-10-08 MED ORDER — ONDANSETRON HCL 4 MG/2ML IJ SOLN
4.0000 mg | Freq: Four times a day (QID) | INTRAMUSCULAR | Status: DC | PRN
  Administered 2018-10-08 – 2018-10-14 (×6): 4 mg via INTRAVENOUS
  Filled 2018-10-08 (×5): qty 2

## 2018-10-08 MED ORDER — MIDAZOLAM HCL 2 MG/2ML IJ SOLN
INTRAMUSCULAR | Status: AC
  Filled 2018-10-08: qty 2

## 2018-10-08 MED ORDER — MAGNESIUM CITRATE PO SOLN: 1.0000 | Freq: Once | ORAL | Status: DC | PRN

## 2018-10-08 MED ORDER — OXYCODONE HCL 5 MG PO TABS
5.0000 mg | ORAL_TABLET | Freq: Once | ORAL | Status: AC | PRN
  Administered 2018-10-08: 5 mg via ORAL

## 2018-10-08 MED ORDER — FENTANYL CITRATE (PF) 100 MCG/2ML IJ SOLN
50.0000 ug | Freq: Once | INTRAMUSCULAR | Status: AC
  Administered 2018-10-08: 50 ug via INTRAVENOUS

## 2018-10-08 SURGICAL SUPPLY — 40 items
APL SKNCLS STERI-STRIP NONHPOA (GAUZE/BANDAGES/DRESSINGS) ×4
BENZOIN TINCTURE PRP APPL 2/3 (GAUZE/BANDAGES/DRESSINGS) ×8 IMPLANT
BLADE SAW RECIP 87.9 MT (BLADE) ×2 IMPLANT
BLADE SURG 21 STRL SS (BLADE) ×2 IMPLANT
BNDG COHESIVE 6X5 TAN STRL LF (GAUZE/BANDAGES/DRESSINGS) ×4 IMPLANT
BNDG GAUZE ELAST 4 BULKY (GAUZE/BANDAGES/DRESSINGS) ×4 IMPLANT
CANISTER WOUNDNEG PRESSURE 500 (CANNISTER) ×1 IMPLANT
COVER SURGICAL LIGHT HANDLE (MISCELLANEOUS) ×2 IMPLANT
COVER WAND RF STERILE (DRAPES) ×2 IMPLANT
CUFF TOURNIQUET SINGLE 34IN LL (TOURNIQUET CUFF) ×2 IMPLANT
CUFF TOURNIQUET SINGLE 44IN (TOURNIQUET CUFF) IMPLANT
DRAPE INCISE IOBAN 66X45 STRL (DRAPES) ×1 IMPLANT
DRAPE U-SHAPE 47X51 STRL (DRAPES) ×2 IMPLANT
DRESSING PREVENA PLUS CUSTOM (GAUZE/BANDAGES/DRESSINGS) ×1 IMPLANT
DRSG PREVENA PLUS CUSTOM (GAUZE/BANDAGES/DRESSINGS) ×2
DURAPREP 26ML APPLICATOR (WOUND CARE) ×2 IMPLANT
ELECT REM PT RETURN 9FT ADLT (ELECTROSURGICAL) ×2
ELECTRODE REM PT RTRN 9FT ADLT (ELECTROSURGICAL) ×1 IMPLANT
GLOVE BIOGEL PI IND STRL 9 (GLOVE) ×1 IMPLANT
GLOVE BIOGEL PI INDICATOR 9 (GLOVE) ×1
GLOVE SURG ORTHO 9.0 STRL STRW (GLOVE) ×2 IMPLANT
GOWN STRL REUS W/ TWL XL LVL3 (GOWN DISPOSABLE) ×2 IMPLANT
GOWN STRL REUS W/TWL XL LVL3 (GOWN DISPOSABLE) ×4
KIT BASIN OR (CUSTOM PROCEDURE TRAY) ×2 IMPLANT
KIT PREVENA INCISION MGT20CM45 (CANNISTER) ×1 IMPLANT
KIT TURNOVER KIT B (KITS) ×2 IMPLANT
MANIFOLD NEPTUNE II (INSTRUMENTS) ×2 IMPLANT
NS IRRIG 1000ML POUR BTL (IV SOLUTION) ×2 IMPLANT
PACK ORTHO EXTREMITY (CUSTOM PROCEDURE TRAY) ×2 IMPLANT
PAD ARMBOARD 7.5X6 YLW CONV (MISCELLANEOUS) ×2 IMPLANT
SPONGE LAP 18X18 RF (DISPOSABLE) ×1 IMPLANT
STAPLER VISISTAT 35W (STAPLE) ×1 IMPLANT
STOCKINETTE IMPERVIOUS LG (DRAPES) ×2 IMPLANT
SUT ETHILON 2 0 PSLX (SUTURE) IMPLANT
SUT SILK 2 0 (SUTURE) ×2
SUT SILK 2-0 18XBRD TIE 12 (SUTURE) ×1 IMPLANT
SUT VIC AB 1 CTX 27 (SUTURE) ×4 IMPLANT
TOWEL OR 17X26 10 PK STRL BLUE (TOWEL DISPOSABLE) ×2 IMPLANT
TUBE CONNECTING 12X1/4 (SUCTIONS) ×2 IMPLANT
YANKAUER SUCT BULB TIP NO VENT (SUCTIONS) ×2 IMPLANT

## 2018-10-08 NOTE — Anesthesia Procedure Notes (Signed)
Procedure Name: MAC Date/Time: 10/08/2018 10:51 AM Performed by: Elayne Snare, CRNA Pre-anesthesia Checklist: Patient identified, Emergency Drugs available, Suction available and Patient being monitored Patient Re-evaluated:Patient Re-evaluated prior to induction Oxygen Delivery Method: Nasal cannula

## 2018-10-08 NOTE — Op Note (Signed)
   Date of Surgery: 10/08/2018  INDICATIONS: Kathy Frank is a 43 y.o.-year-old female who has dehiscence of her transmetatarsal amputation on the left.  She has osteomyelitis foul-smelling drainage and presents at this time for transtibial amputation after failure of limb salvage.Marland Kitchen  PREOPERATIVE DIAGNOSIS: Osteomyelitis ulceration dehiscence left trans-metatarsal amputation.  POSTOPERATIVE DIAGNOSIS: Same.  PROCEDURE: Transtibial amputation Application of Prevena wound VAC  SURGEON: Sharol Given, M.D.  ANESTHESIA:  general  IV FLUIDS AND URINE: See anesthesia.  ESTIMATED BLOOD LOSS: 100 mL.  COMPLICATIONS: None.  DESCRIPTION OF PROCEDURE: The patient was brought to the operating room and underwent a general anesthetic. After adequate levels of anesthesia were obtained patient's lower extremity was prepped using DuraPrep draped into a sterile field. A timeout was called. The foot was draped out of the sterile field with impervious stockinette. A transverse incision was made 11 cm distal to the tibial tubercle. This curved proximally and a large posterior flap was created. The tibia was transected 1 cm proximal to the skin incision. The fibula was transected just proximal to the tibial incision. The tibia was beveled anteriorly. A large posterior flap was created. The sciatic nerve was pulled cut and allowed to retract. The vascular bundles were suture ligated with 2-0 silk. The deep and superficial fascial layers were closed using #1 Vicryl. The skin was closed using staples and 2-0 nylon. The wound was covered with a Prevena wound VAC. There was a good suction fit. A prosthetic shrinker was applied. Patient was extubated taken to the PACU in stable condition.   DISCHARGE PLANNING:  Antibiotic duration: 24 hours postoperatively  Weightbearing: Nonweightbearing on the left  Pain medication: Opioid pathway ordered  Dressing care/ Wound VAC: Continue wound VAC at discharge for 1 week  Discharge  to: Evaluate for skilled nursing placement  Follow-up: In the office 1 week post operative.  Kathy Score, MD [place] 11:30 AM

## 2018-10-08 NOTE — Anesthesia Procedure Notes (Signed)
Anesthesia Regional Block: Adductor canal block   Pre-Anesthetic Checklist: ,, timeout performed, Correct Patient, Correct Site, Correct Laterality, Correct Procedure, Correct Position, site marked, Risks and benefits discussed, pre-op evaluation,  At surgeon's request and post-op pain management  Laterality: Left  Prep: Maximum Sterile Barrier Precautions used, chloraprep       Needles:  Injection technique: Single-shot  Needle Type: Echogenic Stimulator Needle     Needle Length: 9cm  Needle Gauge: 21     Additional Needles:   Procedures:,,,, ultrasound used (permanent image in chart),,,,  Narrative:  Start time: 10/08/2018 10:10 AM End time: 10/08/2018 10:15 AM Injection made incrementally with aspirations every 5 mL.  Performed by: Personally  Anesthesiologist: Roberts Gaudy, MD  Additional Notes: 20 cc 0.5% Bupivacaine with 1:200 epi injected easily

## 2018-10-08 NOTE — Interval H&P Note (Signed)
History and Physical Interval Note:  10/08/2018 7:32 AM  Kathy Frank  has presented today for surgery, with the diagnosis of Osteomyelitis/Gangrene Left Foot  The various methods of treatment have been discussed with the patient and family. After consideration of risks, benefits and other options for treatment, the patient has consented to  Procedure(s): LEFT BELOW KNEE AMPUTATION (Left) as a surgical intervention .  The patient's history has been reviewed, patient examined, no change in status, stable for surgery.  I have reviewed the patient's chart and labs.  Questions were answered to the patient's satisfaction.     Erlinda Hong, PA-C [employer] [phone]

## 2018-10-08 NOTE — Anesthesia Preprocedure Evaluation (Addendum)
Anesthesia Evaluation  Patient identified by MRN, date of birth, ID band Patient awake    Airway Mallampati: II  TM Distance: >3 FB Neck ROM: Full    Dental  (+) Poor Dentition   Pulmonary former smoker,    breath sounds clear to auscultation       Cardiovascular hypertension,  Rhythm:Regular     Neuro/Psych    GI/Hepatic   Endo/Other  diabetes  Renal/GU      Musculoskeletal   Abdominal   Peds  Hematology   Anesthesia Other Findings   Reproductive/Obstetrics                            Anesthesia Physical Anesthesia Plan  ASA: III  Anesthesia Plan: General   Post-op Pain Management:  Regional for Post-op pain   Induction: Intravenous  PONV Risk Score and Plan: Ondansetron  Airway Management Planned: LMA  Additional Equipment:   Intra-op Plan:   Post-operative Plan:   Informed Consent: I have reviewed the patients History and Physical, chart, labs and discussed the procedure including the risks, benefits and alternatives for the proposed anesthesia with the patient or authorized representative who has indicated his/her understanding and acceptance.       Plan Discussed with:   Anesthesia Plan Comments:        Anesthesia Quick Evaluation

## 2018-10-08 NOTE — Anesthesia Procedure Notes (Signed)
Anesthesia Regional Block: Popliteal block   Pre-Anesthetic Checklist: ,, timeout performed, Correct Patient, Correct Site, Correct Laterality, Correct Procedure, Correct Position, site marked, Risks and benefits discussed,  Surgical consent,  Pre-op evaluation,  At surgeon's request and post-op pain management  Laterality: Left  Prep: chloraprep       Needles:  Injection technique: Single-shot  Needle Type: Stimulator Needle - 40      Needle Gauge: 22     Additional Needles:   Procedures:, nerve stimulator,,,,,,,  Narrative:  Start time: 10/08/2018 10:00 AM End time: 10/08/2018 10:10 AM Injection made incrementally with aspirations every 5 mL.  Performed by: Personally  Anesthesiologist: Roberts Gaudy, MD  Additional Notes: 20 cc 0.75% Ropivacaine injected easily

## 2018-10-08 NOTE — Progress Notes (Signed)
   Subjective: Patient was lying in her bed today tearful upon entering the room.  She stated that she feels as if the extent of amputation was not thoroughly discussed that she was comfortable with.  She was advised ID level of IV amputations recommended based on multiple parameters including adequate blood flow for healing, appropriate tissue for reconstruction and hopeful prevention of repeat surgical procedures in the near future.  After a brief explanation she appeared more comfortable and was in agreement with with proceeding at the time of our discussion  Objective:  Vital signs in last 24 hours: Vitals:   10/08/18 1015 10/08/18 1020 10/08/18 1025 10/08/18 1030  BP: (!) 173/85 (!) 170/76 (!) 156/85 (!) 168/80  Pulse: 88 88 88 88  Resp: 13 12 13 13   Temp:      TempSrc:      SpO2: 98% 99% 98% 99%  Weight:      Height:       General: A/O x4, in no acute distress, afebrile, nondiaphoretic Cardio: RRR, no mrg's Pulmonary: CTA bilaterally MSK: Left distal extremity, productive of serosanguinous drainage, edematous, pulse not palpated. Right with skin darkening, nonerythematous, non edematous, not warm to touch.  Psych: Appropriate affect, mood is appropriate for given medical condition  Assessment/Plan:  Principal Problem:   Osteomyelitis ([place]) Active Problems:   Diabetes mellitus type 2 in obese ([place])   Essential hypertension   Human immunodeficiency virus (HIV) disease ([place])   CKD (chronic kidney disease), stage V ([place])   Wound infection s/p L transmetatarsal amputation   Dehiscence of amputation stump ([place])   Moderate protein-calorie malnutrition ([place])  Kathy Frank is a 43 year old female with past medical history of insulin-dependent diabetes, HTN, CKD stage V, and HIV with 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  She presents due to pain and drainage of the left foot.  Given the patient's comorbid morbid conditions with poor wound healing  a transtibial amputation is recommended to be undertaken today.  Plan: Osteomyelitis, left foot: Transmetatarsal amputation on 10/08/2018. Plan to discontinue vancomycin and cefepime following surgery Continue pain control oxycodone 5 mg every 4 hours as needed Breakthrough pain control with Dilaudid 0.5-1 mg every 3 hours recommended  Diabetes mellitus: Continue Lantus 15 units nightly-held day of surgery Sliding-scale moderate with meals  Hypertension: Continue amlodipine 10 mg daily and labetalol 100 mg twice daily  CKD stage V: Creatinine 6.71 on admission continue to trend daily.  6.2 4 repeat this a.m. with potassium increasing to 5.5.  If this trend continues would consider low-dose Lasix, low-dose oral potassium binding agent and nephrology consultation pending absolute value potassium and creatinine BMP in a.m.  Diet: Renal carb modified recommended once advanced following surgery Fluids: N/A given CKD stage V Code: Full DVT PPX: Heparin Dispo: Anticipated discharge in approximately 1-2 day(s).   Kathy Ludwig, MD 10/08/2018, 10:30 AM Pager: Pager# [phone]

## 2018-10-08 NOTE — Plan of Care (Signed)
°  Problem: Coping: °Goal: Level of anxiety will decrease °Outcome: Progressing °  °

## 2018-10-08 NOTE — Transfer of Care (Signed)
Immediate Anesthesia Transfer of Care Note  Patient: Kathy Frank  Procedure(s) Performed: LEFT BELOW KNEE AMPUTATION (Left Leg Lower)  Patient Location: PACU  Anesthesia Type:General  Level of Consciousness: awake, alert  and patient cooperative  Airway & Oxygen Therapy: Patient Spontanous Breathing  Post-op Assessment: Report given to RN and Post -op Vital signs reviewed and stable  Post vital signs: Reviewed and stable  Last Vitals:  Vitals Value Taken Time  BP 130/71 10/08/2018 11:34 AM  Temp    Pulse 86 10/08/2018 11:35 AM  Resp 13 10/08/2018 11:35 AM  SpO2 98 % 10/08/2018 11:35 AM  Vitals shown include unvalidated device data.  Last Pain:  Vitals:   10/08/18 1030  TempSrc:   PainSc: 1       Patients Stated Pain Goal: 3 (91/91/66 0600)  Complications: No apparent anesthesia complications

## 2018-10-08 NOTE — Anesthesia Postprocedure Evaluation (Signed)
Anesthesia Post Note  Patient: Kathy Frank  Procedure(s) Performed: LEFT BELOW KNEE AMPUTATION (Left Leg Lower)     Patient location during evaluation: PACU Anesthesia Type: General Level of consciousness: awake and alert Pain management: pain level controlled Vital Signs Assessment: post-procedure vital signs reviewed and stable Respiratory status: spontaneous breathing, nonlabored ventilation, respiratory function stable and patient connected to nasal cannula oxygen Cardiovascular status: blood pressure returned to baseline and stable Postop Assessment: no apparent nausea or vomiting Anesthetic complications: no    Last Vitals:  Vitals:   10/08/18 1204 10/08/18 1220  BP: (!) 141/74 (!) 149/81  Pulse: 85 82  Resp: 13   Temp: 36.6 C   SpO2: 96% 100%    Last Pain:  Vitals:   10/08/18 1623  TempSrc:   PainSc: 6                  Welden Hausmann COKER

## 2018-10-08 NOTE — Progress Notes (Signed)
Pt agreed to consent after another discussion with surgeon. Pt asked for some time to think after last MD discussion. Notified OR staff that pt is unsure and not signing consent at this time.

## 2018-10-08 NOTE — Progress Notes (Signed)
Patient ID: ZAMYIAH TINO, female   DOB: [birth year], 43 y.o.   MRN: [id number] Patient has ulceration osteomyelitis with exposed bone with foul-smelling drainage left transmetatarsal amputation.  Patient was scheduled for transtibial amputation this morning and patient states that she does not want to proceed with surgery.  Discussed the risks of systemic infection complications of the infection getting worse.  Patient states she understands states that she will not proceed with surgery at this time.  Surgery was canceled.  I will follow-up when patient is ready to consider surgery.

## 2018-10-08 NOTE — Progress Notes (Signed)
Pt agrees to sign consent for surgical procedure. States she is fully informed and has no further questions. Did offer comfort and emotional support d/t visible distress about losing a limb. Pt left floor to surgical area.

## 2018-10-09 ENCOUNTER — Encounter (HOSPITAL_COMMUNITY): Payer: Self-pay | Admitting: Orthopedic Surgery

## 2018-10-09 LAB — BASIC METABOLIC PANEL
Anion gap: 9 (ref 5–15)
BUN: 43 mg/dL — ABNORMAL HIGH (ref 6–20)
CO2: 17 mmol/L — ABNORMAL LOW (ref 22–32)
Calcium: 7.9 mg/dL — ABNORMAL LOW (ref 8.9–10.3)
Chloride: 109 mmol/L (ref 98–111)
Creatinine, Ser: 6.47 mg/dL — ABNORMAL HIGH (ref 0.44–1.00)
GFR calc Af Amer: 8 mL/min — ABNORMAL LOW (ref >60)
GFR calc non Af Amer: 7 mL/min — ABNORMAL LOW (ref >60)
Glucose, Bld: 140 mg/dL — ABNORMAL HIGH (ref 70–99)
Potassium: 5.5 mmol/L — ABNORMAL HIGH (ref 3.5–5.1)
Sodium: 135 mmol/L (ref 135–145)

## 2018-10-09 LAB — AEROBIC CULTURE W GRAM STAIN (SUPERFICIAL SPECIMEN)
Culture: NORMAL
Gram Stain: NONE SEEN

## 2018-10-09 LAB — GLUCOSE, CAPILLARY
Glucose-Capillary: 133 mg/dL — ABNORMAL HIGH (ref 70–99)
Glucose-Capillary: 72 mg/dL (ref 70–99)
Glucose-Capillary: 145 mg/dL — ABNORMAL HIGH (ref 70–99)
Glucose-Capillary: 124 mg/dL — ABNORMAL HIGH (ref 70–99)

## 2018-10-09 LAB — IRON AND TIBC
Iron: 13 ug/dL — ABNORMAL LOW (ref 28–170)
Saturation Ratios: 6 % — ABNORMAL LOW (ref 10.4–31.8)
TIBC: 227 ug/dL — ABNORMAL LOW (ref 250–450)
UIBC: 214 ug/dL

## 2018-10-09 LAB — VITAMIN B12: Vitamin B-12: 306 pg/mL (ref 180–914)

## 2018-10-09 LAB — FERRITIN: Ferritin: 92 ng/mL (ref 11–307)

## 2018-10-09 MED ORDER — LABETALOL HCL 200 MG PO TABS
200.0000 mg | ORAL_TABLET | Freq: Two times a day (BID) | ORAL | Status: DC
  Administered 2018-10-09 – 2018-10-11 (×4): 200 mg via ORAL
  Filled 2018-10-09 (×4): qty 1

## 2018-10-09 MED ORDER — GABAPENTIN 300 MG PO CAPS
300.0000 mg | ORAL_CAPSULE | Freq: Three times a day (TID) | ORAL | Status: DC
  Administered 2018-10-09: 300 mg via ORAL
  Filled 2018-10-09: qty 1

## 2018-10-09 MED ORDER — SODIUM POLYSTYRENE SULFONATE 15 GM/60ML PO SUSP
15.0000 g | Freq: Two times a day (BID) | ORAL | Status: DC
  Filled 2018-10-09: qty 60

## 2018-10-09 MED ORDER — HYDROMORPHONE HCL 1 MG/ML IJ SOLN
0.5000 mg | INTRAMUSCULAR | Status: DC | PRN
  Administered 2018-10-09 – 2018-10-10 (×4): 0.5 mg via INTRAVENOUS
  Filled 2018-10-09 (×4): qty 1

## 2018-10-09 MED ORDER — HYDROMORPHONE HCL 1 MG/ML IJ SOLN
0.5000 mg | Freq: Once | INTRAMUSCULAR | Status: AC
  Administered 2018-10-09: 0.5 mg via INTRAVENOUS
  Filled 2018-10-09: qty 1

## 2018-10-09 MED ORDER — GABAPENTIN 300 MG PO CAPS
300.0000 mg | ORAL_CAPSULE | Freq: Every day | ORAL | Status: DC
  Administered 2018-10-10 – 2018-10-16 (×7): 300 mg via ORAL
  Filled 2018-10-09 (×7): qty 1

## 2018-10-09 MED ORDER — KIDNEY FAILURE BOOK
Freq: Once | Status: AC
  Administered 2018-10-09: 1

## 2018-10-09 MED ORDER — SODIUM ZIRCONIUM CYCLOSILICATE 10 G PO PACK
10.0000 g | PACK | Freq: Every day | ORAL | Status: AC
  Administered 2018-10-09 – 2018-10-11 (×3): 10 g via ORAL
  Filled 2018-10-09 (×3): qty 1

## 2018-10-09 MED ORDER — DARBEPOETIN ALFA 60 MCG/0.3ML IJ SOSY
60.0000 ug | PREFILLED_SYRINGE | INTRAMUSCULAR | Status: DC
  Filled 2018-10-09: qty 0.3

## 2018-10-09 MED ORDER — SODIUM BICARBONATE 650 MG PO TABS
650.0000 mg | ORAL_TABLET | Freq: Two times a day (BID) | ORAL | Status: DC
  Administered 2018-10-09 – 2018-10-10 (×3): 650 mg via ORAL
  Filled 2018-10-09 (×3): qty 1

## 2018-10-09 MED ORDER — CALCIUM ACETATE (PHOS BINDER) 667 MG PO CAPS
1334.0000 mg | ORAL_CAPSULE | Freq: Three times a day (TID) | ORAL | Status: DC
  Administered 2018-10-09 – 2018-10-16 (×20): 1334 mg via ORAL
  Filled 2018-10-09 (×21): qty 2

## 2018-10-09 NOTE — Plan of Care (Signed)

## 2018-10-09 NOTE — Progress Notes (Signed)
   Subjective: Overnight, Kathy Frank had nausea and vomiting as well as severe pain and muscle spasms at her surgical site. This morning, she continues to have pain, but her nausea and vomiting have improved. All of her questions were addressed.   Objective:  Vital signs in last 24 hours: Vitals:   10/08/18 2049 10/09/18 0016 10/09/18 0023 10/09/18 0459  BP: (!) 161/90 (!) 179/99 (!) 179/99 (!) 179/87  Pulse: 81 87 87 94  Resp: 18 18 18 18   Temp: 97.8 F (36.6 C) 98.5 F (36.9 C) 98.5 F (36.9 C) 98.7 F (37.1 C)  TempSrc: Oral Oral Oral Oral  SpO2: 100% 100% 100% 100%  Weight:      Height:       Gen: lying in bed, appears uncomfortable CV: RRR, no murmurs Pulm: CTAB, normal effort on room air Abd: soft, non-distended, non-tender Ext: no edema, L transtibial amputation with wound vac in place, no drainage in the wound vac cannister   Assessment/Plan:  Principal Problem:   Osteomyelitis ([facility]) Active Problems:   Diabetes mellitus type 2 in obese ([facility])   Essential hypertension   Human immunodeficiency virus (HIV) disease ([facility])   CKD (chronic kidney disease), stage V ([facility])   Wound infection s/p L transmetatarsal amputation   Dehiscence of amputation stump ([place])   Moderate protein-calorie malnutrition ([place])  Kathy Frank is a 42 year old female with past medical history of insulin-dependent diabetes, HTN, CKD stage V, and HIV with 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  She presented with pain and drainage of the left foot. She underwent transtibial amputation on 10/08/2018 given the patient's comorbid morbid conditions with poor wound healing.  Osteomyelitis, left foot - S/p transmetatarsal amputation on 10/08/2018. Wound appears to be healing well, but patient is continuing to have pain. She will likely need SNF.  Plan - Ortho following, appreciate recs - Discontinue vancomycin and cefepime 24 hours post-op - Pain control: PO oxycodone 5  mg every 4 hours as needed plus IV Dilaudid 0.5 mg every 3 hours for breakthrough pain - Start gabapentin 300mg  TID - Zofran and reglan prn for n/v - Continue Robaxin q6hrs prn for muscle spasms - PT eval - Outpatient f/u with ortho 1 week post-op. Continue wound vac until then.   Diabetes mellitus - Blood sugars within goal Plan - Continue Lantus 15 units nightly - Sliding-scale moderate with meals  Hypertension - Blood pressures elevated to 179/87 this morning. Amlodipine was held yesterday for surgery. HTN may also be 2/2 pain.  Plan - Continue amlodipine 10 mg daily and labetalol 100 mg twice daily - Continue to monitor   CKD stage V - Creatinine 6.71 on admission. Cr 6.47 this am with K persistently elevated at 5.5.  Plan - Start Lokelma 10g daily - Consulted nephrology, appreciate recs - trend BMP  Dispo: Anticipated discharge in approximately 1-2 days.  Kathy Frank, [name], MD 10/09/2018, 6:37 AM Pager: [phone]

## 2018-10-09 NOTE — Evaluation (Signed)
Physical Therapy Evaluation Patient Details Name: Kathy Frank MRN: [id number] DOB: [birth year] Today's Date: 10/09/2018   History of Present Illness  Kathy Frank is a 42 y.o.-year-old female who has dehiscence of her transmetatarsal amputation on the left.  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) , Normocytic anemia, diabetes mellitus   Clinical Impression   Patient is s/p above surgery resulting in functional limitations due to the deficits listed below (see PT Problem List). Managing independently prior to admission with RW and then crutches; Presents to PT with pain, decr functional mobility, decr activity tolerance, decr knowledge of therapeutic exercises for muscle length and strength in prep for prosthesis training; Overall moving well, and Kathy Frank is realistic about dc'ing to her home -- she is considering going to her Kathy Frank's where there aren't steps to enter;  Patient will benefit from skilled PT to increase their independence and safety with mobility to allow discharge to the venue listed below.       Follow Up Recommendations Home health PT;Supervision/Assistance - 24 hour(her insurance may not cover HHPT, and I'm hopeful that she will improve well enough to save PT visits for the year for her porsthesis training; will monitor progress and update recs as appropriate)    Equipment Recommendations  3in1 (PT)    Recommendations for Other Services       Precautions / Restrictions Precautions Precautions: Fall Precaution Comments: Fall risk greatly reduced with use of RW Restrictions LLE Weight Bearing: Non weight bearing      Mobility  Bed  Mobility Overal bed mobility: Needs Assistance Bed Mobility: Supine to Sit     Supine to sit: Supervision     General bed mobility comments: Supervision mostly for VAC line  Transfers Overall transfer level: Needs assistance Equipment used: Rolling walker (2 wheeled) Transfers: Sit to/from Stand Sit to Stand: Min guard         General transfer comment: minguard for safety; cues for hand placement; good rise  Ambulation/Gait Ambulation/Gait assistance: Min guard Gait Distance (Feet): 6 Feet Assistive device: Rolling walker (2 wheeled) Gait Pattern/deviations: (hop-to)     General Gait Details: Hop-to gait bed to chair; limited by pain, but overall steady  Stairs            Wheelchair Mobility    Modified Rankin (Stroke Patients Only)       Balance Overall balance assessment: Needs assistance           Standing balance-Leahy Scale: Poor                               Pertinent Vitals/Pain Pain Assessment: Faces Faces Pain Scale: Hurts whole lot Pain Location: Residucal limb Pain Descriptors / Indicators: Spasm Pain Intervention(s): Monitored during session;Patient requesting pain meds-RN notified    Home Living Family/patient expects to be discharged to:: Private residence Living Arrangements: Children Available Help at Discharge: Family;Available 24 hours/day Type of Home: House Home Access: Stairs to enter(may go to neice's house with no steps to enter) Entrance Stairs-Rails: Can reach both Entrance Stairs-Number of Steps: 3 Home Layout: One level Home Equipment: Walker - 2 wheels  Prior Function Level of Independence: Independent with assistive device(s)         Comments: Pt reports she has been managing with RW and crutches since last admission in Dec     Hand Dominance   Dominant Hand: Right    Extremity/Trunk Assessment   Upper Extremity Assessment Upper Extremity Assessment: Overall WFL for tasks assessed     Lower Extremity Assessment Lower Extremity Assessment: LLE deficits/detail LLE Deficits / Details: S/p BKA; painful residal limb, mostly spasm; in limbguard on eval opted not to assess knee flex/extend today; performed hamstring stretch       Communication   Communication: No difficulties  Cognition Arousal/Alertness: Awake/alert Behavior During Therapy: WFL for tasks assessed/performed Overall Cognitive Status: Within Functional Limits for tasks assessed                                        General Comments General comments (skin integrity, edema, etc.): Educated pt in cause of phntom pain and desensitization technqiues, as well as exercises to anticipate doing in prep for prosthesis    Exercises     Assessment/Plan    PT Assessment Patient needs continued PT services  PT Problem List Decreased strength;Decreased range of motion;Decreased activity tolerance;Decreased balance;Decreased mobility;Decreased coordination;Decreased knowledge of use of DME;Decreased knowledge of precautions;Pain       PT Treatment Interventions DME instruction;Gait training;Stair training;Functional mobility training;Therapeutic activities;Therapeutic exercise;Patient/family education    PT Goals (Current goals can be found in the Care Plan section)  Acute Rehab PT Goals Patient Stated Goal: get to prosthesis PT Goal Formulation: With patient Time For Goal Achievement: 10/23/18 Potential to Achieve Goals: Good    Frequency Min 3X/week   Barriers to discharge        Co-evaluation               AM-PAC PT "6 Clicks" Mobility  Outcome Measure Help needed turning from your back to your side while in a flat bed without using bedrails?: None Help needed moving from lying on your back to sitting on the side of a flat bed without using bedrails?: None Help needed moving to and from a bed to a chair (including a wheelchair)?: A Little Help needed standing up from a chair  using your arms (e.g., wheelchair or bedside chair)?: A Little Help needed to walk in hospital room?: A Little Help needed climbing 3-5 steps with a railing? : A Lot 6 Click Score: 19    End of Session Equipment Utilized During Treatment: Gait belt Activity Tolerance: Patient tolerated treatment well Patient left: in chair;with call bell/phone within reach Nurse Communication: Mobility status PT Visit Diagnosis: Unsteadiness on feet (R26.81);Other abnormalities of gait and mobility (R26.89);Pain Pain - Right/Left: Left Pain - part of body: Leg    Time: 1153-1220 PT Time Calculation (min) (ACUTE ONLY): 27 min   Charges:   PT Evaluation $PT Eval Moderate Complexity: 1 Mod PT Treatments $Gait Training: 8-22 mins        Roney Marion, PT  Acute Rehabilitation Services Pager [phone] Office [phone]   [place] 10/09/2018, 4:02 PM

## 2018-10-09 NOTE — Progress Notes (Signed)
Orthopedic Tech Progress Note Patient Details:  Kathy Frank August 11, 1975 [id number] Called in brace order Patient ID: Kathy Frank, female   DOB: Oct 23, 1975, 43 y.o.   MRN: [id number]   [name] 10/09/2018, 10:25 AM

## 2018-10-09 NOTE — Consult Note (Signed)
Reason for Consult: Advanced CKD stage 5 not yet on dialysis Referring Physician: Rebeca Alert, MD  Kathy Frank is an 43 y.o. female.  HPI: Ms. Boggess has a PMH significant for longstanding, poorly controlled DM and HTN, HIV (on meds and CD4 41), CKD stage 5 followed by Dr. Posey Pronto (however she has been noncompliant with follow up over the past 2 years), and PAD s/p LBKA 10/08/18 for osteomyelitis/gangrene of her left foot (she had a left transmetatarsal amputation on 07/08/18 and has had worsening drainage from the wound and dehiscence).  She has had progressive CKD over the past 2 years and we were consulted to help further evaluate and manage her CKD and electrolyte abnormalities.  She has had hyperkalemia without EKG changes and metabolic acidosis, as well as anemia of CKD.  She did have some N/V this morning but reports good appetite and weight gain.  The trend in Scr is seen below.  Of note, dialysis has been discussed with her but she missed her last appointment with Dr. Posey Pronto and has not been seen by [facility] for vascular access placement.   Trend in Creatinine: Creatinine, Ser  Date/Time Value Ref Range Status  10/09/2018 04:33 AM 6.47 (H) 0.44 - 1.00 mg/dL Final  10/08/2018 03:12 AM 6.24 (H) 0.44 - 1.00 mg/dL Final  10/06/2018 10:22 PM 6.71 (H) 0.44 - 1.00 mg/dL Final  08/19/2018 11:38 AM 5.88 (H) 0.44 - 1.00 mg/dL Final  08/12/2018 02:37 AM 6.97 (H) 0.44 - 1.00 mg/dL Final  08/11/2018 01:31 PM 6.91 (H) 0.44 - 1.00 mg/dL Final  08/11/2018 03:42 AM 6.69 (H) 0.44 - 1.00 mg/dL Final  08/11/2018 01:19 AM 6.71 (H) 0.44 - 1.00 mg/dL Final  08/10/2018 04:14 PM 6.61 (H) 0.44 - 1.00 mg/dL Final  08/10/2018 03:46 AM 6.28 (H) 0.44 - 1.00 mg/dL Final  08/09/2018 03:48 AM 6.13 (H) 0.44 - 1.00 mg/dL Final  08/08/2018 02:44 AM 5.56 (H) 0.44 - 1.00 mg/dL Final  08/07/2018 05:47 AM 5.29 (H) 0.44 - 1.00 mg/dL Final  08/06/2018 04:29 PM 5.37 (H) 0.44 - 1.00 mg/dL Final  07/11/2018 05:10 AM 5.94 (H) 0.44 - 1.00  mg/dL Final  07/10/2018 03:04 AM 5.70 (H) 0.44 - 1.00 mg/dL Final  07/09/2018 02:52 AM 5.83 (H) 0.44 - 1.00 mg/dL Final  07/08/2018 03:53 AM 5.56 (H) 0.44 - 1.00 mg/dL Final  07/07/2018 03:09 AM 5.62 (H) 0.44 - 1.00 mg/dL Final  07/06/2018 03:35 AM 5.56 (H) 0.44 - 1.00 mg/dL Final  07/05/2018 05:07 AM 6.02 (H) 0.44 - 1.00 mg/dL Final  07/04/2018 06:08 AM 5.98 (H) 0.44 - 1.00 mg/dL Final  07/03/2018 03:05 PM 5.75 (H) 0.44 - 1.00 mg/dL Final  07/03/2018 08:43 AM 6.44 (H) 0.44 - 1.00 mg/dL Final  07/02/2018 11:30 PM 6.74 (H) 0.44 - 1.00 mg/dL Final  12/23/2017 03:58 PM 3.92 (H) 0.57 - 1.00 mg/dL Final  09/14/2017 04:06 AM 3.19 (H) 0.44 - 1.00 mg/dL Final  09/13/2017 06:07 AM 3.89 (H) 0.44 - 1.00 mg/dL Final  09/13/2017 03:53 AM 3.96 (H) 0.44 - 1.00 mg/dL Final  09/12/2017 03:10 PM 3.87 (H) 0.44 - 1.00 mg/dL Final  03/14/2017 10:48 PM 2.52 (H) 0.44 - 1.00 mg/dL Final  03/14/2017 05:33 PM 2.71 (H) 0.44 - 1.00 mg/dL Final  02/02/2017 10:47 AM 2.71 (H) 0.57 - 1.00 mg/dL Final  12/02/2016 11:23 AM 2.54 (H) 0.57 - 1.00 mg/dL Final  11/23/2016 04:36 AM 2.91 (H) 0.44 - 1.00 mg/dL Final  11/22/2016 04:06 PM 3.31 (H) 0.44 - 1.00 mg/dL Final  11/20/2016 07:16 PM 2.73 (H) 0.44 - 1.00 mg/dL Final  11/16/2016 04:59 AM 2.43 (H) 0.44 - 1.00 mg/dL Final  11/15/2016 03:20 AM 2.43 (H) 0.44 - 1.00 mg/dL Final  11/14/2016 05:32 PM 2.89 (H) 0.44 - 1.00 mg/dL Final  10/06/2016 11:22 AM 2.05 (H) 0.57 - 1.00 mg/dL Final  10/01/2016 05:50 AM 2.45 (H) 0.44 - 1.00 mg/dL Final  09/30/2016 08:50 AM 3.15 (H) 0.44 - 1.00 mg/dL Final  05/29/2016 02:38 PM 1.99 (H) 0.44 - 1.00 mg/dL Final  04/23/2016 03:35 PM 1.66 (H) 0.57 - 1.00 mg/dL Final  01/06/2016 08:23 AM 1.70 (H) 0.44 - 1.00 mg/dL Final  01/04/2016 08:27 PM 2.26 (H) 0.44 - 1.00 mg/dL Final  11/20/2015 11:20 AM 2.05 (H) 0.44 - 1.00 mg/dL Final  09/27/2015 10:47 AM 1.27 (H) 0.44 - 1.00 mg/dL Final  05/30/2015 08:56 AM 1.83 (H) 0.44 - 1.00 mg/dL Final  12/25/2014  04:15 AM 1.09 (H) 0.44 - 1.00 mg/dL Final  12/24/2014 09:59 AM 1.08 (H) 0.44 - 1.00 mg/dL Final    PMH:   Past Medical History:  Diagnosis Date  . CKD (chronic kidney disease) stage 5, GFR less than 15 ml/min (HCC) 06/19/2016   06/30/16: Seen by Dr. Posey Pronto [Nephrology]. Suspect etiologies to include diabetes and hypertension though cannot exclude HIV-associated nephropathy. Advised her against from using excessive Goody powder.  . Contact dermatitis 04/21/2016   07/29/16: Skin biopsy performed by [facility] notable for acute spongiotic dermatitis consistent with contact dermatitis.  . Cyclical vomiting    THC induced???  . Depression 06/28/2006   Qualifier: Diagnosis of  By: Riccardo Dubin MD, [place]    . Diabetes mellitus type 2 in obese ([place]) 06/28/1994  . DKA (diabetic ketoacidoses) ([place]) 12/2014  . Erosive esophagitis   . Esophageal reflux   . Essential hypertension 11/16/2013  . Gastroparesis    ? diabetic  . GERD (gastroesophageal reflux disease)   . Human immunodeficiency virus (HIV) disease ([place]) 04/23/2016  . Hypertension   . Moderate nonproliferative diabetic retinopathy of both eyes ([place]) 11/21/2014   11/14/14: Noted on retinal imaging; needs follow-up imaging in 6 months  05/22/16: Noted on retinal imaging again; needs follow-up imaging in 6 months  . Normocytic anemia   . TOBACCO USER 06/28/2006   Qualifier: Diagnosis of  By: Riccardo Dubin MD, Sherren Mocha    . Type II diabetes mellitus ([place]) 1994   diagnosed around 57    [place]:   Past Surgical History:  Procedure Laterality Date  . AMPUTATION Left 07/08/2018   Procedure: AMPUTATION FORTH RAY LEFT FOOT;  Surgeon: [name], MD;  Location: [facility];  Service: Orthopedics;  Laterality: Left;  . AMPUTATION Left 08/09/2018   Procedure: Left Transmetatarsal Amputation;  Surgeon: [name], MD;  Location: [facility];  Service: Orthopedics;  Laterality: Left;  . AMPUTATION Left 10/08/2018   Procedure: LEFT BELOW KNEE AMPUTATION;   Surgeon: [name], MD;  Location: [facility];  Service: Orthopedics;  Laterality: Left;  . LOWER EXTREMITY ANGIOGRAPHY N/A 07/05/2018   Procedure: LOWER EXTREMITY ANGIOGRAPHY;  Surgeon: Serafina Mitchell, MD;  Location: [facility];  Service: Cardiovascular;  Laterality: N/A;  . PERIPHERAL VASCULAR BALLOON ANGIOPLASTY Left 07/05/2018   Procedure: PERIPHERAL VASCULAR BALLOON ANGIOPLASTY;  Surgeon: Serafina Mitchell, MD;  Location: [facility];  Service: Cardiovascular;  Laterality: Left;  SFA  . TUBAL LIGATION  2002    Allergies: No Known Allergies  Medications:   Prior to Admission medications  Medication Sig Start Date End Date Taking? Authorizing Provider  amLODipine (NORVASC) 10 MG tablet Take 1 tablet (10 mg total) by mouth daily. 12/24/17 10/07/18 Yes Rice, Resa Miner, MD  aspirin 81 MG EC tablet Take 1 tablet (81 mg total) by mouth daily. 07/12/18  Yes Mosetta Anis, MD  citalopram (CELEXA) 20 MG tablet Take 1 tablet (20 mg total) by mouth daily. 08/12/18 10/07/18 Yes Carroll Sage, MD  Dolutegravir-Rilpivirine (JULUCA) 50-25 MG TABS Take 1 tablet by mouth daily. Please take with largest meal of the day. 07/05/18  Yes Annia Belt, MD  Insulin Glargine (LANTUS) 100 UNIT/ML Solostar Pen Inject 15 Units into the skin daily at 10 pm. Patient taking differently: Inject 15 Units into the skin at bedtime.  12/24/17  Yes Rice, Resa Miner, MD  labetalol (NORMODYNE) 100 MG tablet TAKE 1 TABLET BY MOUTH 2 TIMES DAILY Patient taking differently: Take 100 mg by mouth 2 (two) times daily.  07/13/18  Yes Chundi, Vahini, MD  liraglutide (VICTOZA) 18 MG/3ML SOPN Inject 0.2 mLs (1.2 mg total) into the skin daily. 02/04/18  Yes Chundi, Vahini, MD  Multiple Vitamin (MULTIVITAMIN WITH MINERALS) TABS tablet Take 1 tablet by mouth daily. 12/24/17  Yes Rice, Resa Miner, MD  ondansetron (ZOFRAN) 4 MG tablet Take 1 tablet (4 mg total) by mouth daily as needed for nausea or vomiting.  08/19/18 08/19/19 Yes Alphonzo Grieve, MD  polyethylene glycol (MIRALAX / GLYCOLAX) packet Take 17 g by mouth daily. Patient taking differently: Take 17 g by mouth daily as needed for mild constipation.  08/12/18  Yes Carroll Sage, MD  senna (SENOKOT) 8.6 MG TABS tablet Take 2 tablets (17.2 mg total) by mouth 2 (two) times daily. Patient taking differently: Take 2 tablets by mouth daily as needed for mild constipation.  08/12/18  Yes Carroll Sage, MD  silver sulfADIAZINE (SILVADENE) 1 % cream Apply 1 application topically daily. 08/01/18  Yes Rayburn, Neta Mends, PA-C  sodium bicarbonate 650 MG tablet Take 1 tablet (650 mg total) by mouth 2 (two) times daily. 12/24/17  Yes Rice, Resa Miner, MD  triamcinolone cream (KENALOG) 0.1 % Apply 1 application topically 2 (two) times daily. Patient taking differently: Apply 1 application topically 2 (two) times daily as needed (itching).  02/04/18  Yes Chundi, Vahini, MD  glucose blood (ACCU-CHEK GUIDE) test strip USE TO TEST BLOOD SUGAR 3 TIMES DAILY . Diagnosis code  E11.69, E66.9 07/09/17   Lars Mage, MD  sulfamethoxazole-trimethoprim (BACTRIM DS,SEPTRA DS) 800-160 MG tablet Take 1 tablet by mouth 2 (two) times daily. Patient not taking: Reported on 10/07/2018 09/15/18   Rayburn, Neta Mends, PA-C    Inpatient medications: . amLODipine  10 mg Oral Daily  . citalopram  20 mg Oral Daily  . docusate sodium  100 mg Oral BID  . dolutegravir  50 mg Oral Q breakfast   And  . rilpivirine  25 mg Oral Q breakfast  . [START ON 10/10/2018] gabapentin  300 mg Oral Daily  . heparin  5,000 Units Subcutaneous Q8H  . insulin aspart  0-15 Units Subcutaneous TID WC  . insulin glargine  15 Units Subcutaneous Q2200  . labetalol  100 mg Oral BID  . sodium zirconium cyclosilicate  10 g Oral Daily    Discontinued Meds:   Medications Discontinued During This Encounter  Medication Reason  . lactated ringers infusion   . lactated ringers infusion   .  Dolutegravir-Rilpivirine 50-25 MG TABS 1 tablet   .  oxyCODONE (Oxy IR/ROXICODONE) immediate release tablet 5 mg   . insulin glargine (LANTUS) injection 15 Units   . mupirocin ointment (BACTROBAN) 2 % 1 application   . ceFAZolin (ANCEF) IVPB 2g/100 mL premix   . 0.9 % irrigation (POUR BTL) Patient Discharge  . fentaNYL (SUBLIMAZE) injection 25-50 mcg Patient Transfer  . ondansetron (ZOFRAN) injection 4 mg Patient Transfer  . ondansetron (ZOFRAN) tablet 4 mg P&T Policy: Duplicate PRN Therapy  . ondansetron (ZOFRAN) injection 4 mg P&T Policy: Duplicate PRN Therapy  . HYDROmorphone (DILAUDID) injection 0.5 mg   . sodium polystyrene (KAYEXALATE) 15 GM/60ML suspension 15 g   . vancomycin (VANCOCIN) IVPB 750 mg/150 ml premix   . gabapentin (NEURONTIN) capsule 300 mg   . ceFEPIme (MAXIPIME) 1 g in sodium chloride 0.9 % 100 mL IVPB   . HYDROmorphone (DILAUDID) injection 0.5 mg     Social History:  reports that she quit smoking about 4 years ago. Her smoking use included cigarettes. She started smoking about 4 years ago. She has a 1.00 pack-year smoking history. She has never used smokeless tobacco. She reports current alcohol use. She reports current drug use. Frequency: 4.00 times per week. Drug: Marijuana.  Family History:   Family History  Problem Relation Age of Onset  . Diabetes Mother   . Diabetes Brother   . Diabetes Daughter   . Diabetes Daughter   . Mental retardation Brother        died from PNA  . Diabetes Maternal Grandmother     Pertinent items are noted in HPI. Weight change:   Intake/Output Summary (Last 24 hours) at 10/09/2018 1250 Last data filed at 10/09/2018 0300 Gross per 24 hour  Intake 965.39 ml  Output -  Net 965.39 ml   BP (!) 174/97 (BP Location: Right Arm)   Pulse 97   Temp 99 F (37.2 C) (Oral)   Resp 18   Ht 5\' 5"  (1.651 m)   Wt 72.6 kg   SpO2 100%   BMI 26.63 kg/m  Vitals:   10/09/18 0016 10/09/18 0023 10/09/18 0459 10/09/18 0933  BP: (!) 179/99  (!) 179/99 (!) 179/87 (!) 174/97  Pulse: 87 87 94 97  Resp: 18 18 18    Temp: 98.5 F (36.9 C) 98.5 F (36.9 C) 98.7 F (37.1 C) 99 F (37.2 C)  TempSrc: Oral Oral Oral Oral  SpO2: 100% 100% 100% 100%  Weight:      Height:         General appearance: alert and no distress Head: Normocephalic, without obvious abnormality, atraumatic Resp: clear to auscultation bilaterally Cardio: regular rate and rhythm and no rub GI: soft, non-tender; bowel sounds normal; no masses,  no organomegaly Extremities: s/p left BKA, no edema   Labs: Basic Metabolic Panel: Recent Labs  Lab 10/06/18 2222 10/07/18 0403 10/08/18 0312 10/09/18 0433  NA 138 139 135 135  K 4.9 4.7 5.5* 5.5*  CL 114*  --  113* 109  CO2 15*  --  13* 17*  GLUCOSE 267*  --  117* 140*  BUN 53*  --  45* 43*  CREATININE 6.71*  --  6.24* 6.47*  ALBUMIN 3.2*  --   --   --   CALCIUM 7.5*  --  7.7* 7.9*   Liver Function Tests: Recent Labs  Lab 10/06/18 2222  AST 11*  ALT 8  ALKPHOS 101  BILITOT 0.1*  PROT 7.5  ALBUMIN 3.2*   No results for input(s): LIPASE, AMYLASE in the last  168 hours. No results for input(s): AMMONIA in the last 168 hours. CBC: Recent Labs  Lab 10/06/18 2222 10/07/18 0403 10/08/18 0312  WBC 7.6  --  6.2  NEUTROABS 4.8  --   --   HGB 8.0* 11.2* 7.3*  HCT 26.7* 33.0* 23.6*  MCV 94.3  --  92.5  PLT 278  --  260   PT/INR: @LABRCNTIP (inr:5) Cardiac Enzymes: )No results for input(s): CKTOTAL, CKMB, CKMBINDEX, TROPONINI in the last 168 hours. CBG: Recent Labs  Lab 10/08/18 0759 10/08/18 1138 10/08/18 1633 10/08/18 2048 10/09/18 0854  GLUCAP 103* 134* 154* 155* 133*    Iron Studies: No results for input(s): IRON, TIBC, TRANSFERRIN, FERRITIN in the last 168 hours.  Xrays/Other Studies: No results found.   Assessment/Plan: 1.  CKD stage 5- due to longstanding, poorly controlled DM and HTN.  She had some N/V this morning but reports a good appetite and is without asterixis.  Will  educate about dialysis and will ask [facility] to see her for vein mapping and access placement when they deem her stable for this.  No urgent indication for dialysis at this time 2. Hyperkalemia- due to #1.  Will start binders and follow.  She will need K restricted diet 3. PAD s/p LBKA- per ortho 4. Anemia of CKD stage 5- will check iron stores and start esa therapy 5. SHPTh- will check iPTH and phos as she will need binders.   6. DM- per primary 7. HIV- on meds, CD4 450 in May 2019 8. HTN - poorly controlled. Possibly related to pain.  Would increase labetalol to 200mg  tid and follow. 9. Neuropathy- agree that gabapentin dose should be max of 300mg  daily given advanced CKD.    Governor Rooks Dustan Hyams 10/09/2018, 12:50 PM

## 2018-10-09 NOTE — Progress Notes (Signed)
Pt c/o of pain all night without relief and refused RN fixing the leak on the wound Vac. Wound vac started working automatically at 0730. Reported to oncoming nurse.

## 2018-10-09 NOTE — Progress Notes (Signed)
Patient ID: Kathy Frank, female   DOB: 1976/03/21, 43 y.o.   MRN: [id number] Patient is postoperative day 1 left transtibial amputation.  Patient complains of sharp nerve pain and muscle spasm.  Orders written for Neurontin 300 mg 3 times a day.  There is no drainage in the wound VAC canister will consult biotech for stump shrinker and limb protector.

## 2018-10-09 NOTE — Progress Notes (Signed)
Pharmacy note: Gabapentin  Patient with stage V CKD (CrCl ~27ml/min). Order received for gabapentin 300mg  TID. Due to renal elimination and potential for neuro toxicity of gabapentin at this level of renal function, discussed gabapentin dosing with Dr. Annie Paras. Suggested 300mg  daily maximum for now at this level of renal function. Order obtained to decrease dose.   Plan:  Decrease gabapentin 300mg  daily Monitor for myoclonus, neurotoxicity  Thank you!  Nori Winegar A. Levada Dy, PharmD, [place] Pager: [phone] Please utilize Amion for appropriate phone number to reach the unit pharmacist ([place])

## 2018-10-09 NOTE — Progress Notes (Signed)
Pt vomited her Po Methocarbamol and c/o muscle spasm. On call Provider for internal medicine was notified and she said to give the ordered iv dose

## 2018-10-09 NOTE — Plan of Care (Signed)
  Problem: Pain Managment: °Goal: General experience of comfort will improve °Outcome: Not Progressing °  °Problem: Safety: °Goal: Ability to remain free from injury will improve °Outcome: Progressing °  °

## 2018-10-10 DIAGNOSIS — E119 Type 2 diabetes mellitus without complications: Secondary | ICD-10-CM

## 2018-10-10 DIAGNOSIS — N185 Chronic kidney disease, stage 5: Secondary | ICD-10-CM

## 2018-10-10 LAB — FOLATE RBC
Folate, Hemolysate: 205 ng/mL
Folate, RBC: 1062 ng/mL (ref >498)
Hematocrit: 19.3 % — ABNORMAL LOW (ref 34.0–46.6)

## 2018-10-10 LAB — CBC
HCT: 18.1 % — ABNORMAL LOW (ref 36.0–46.0)
Hemoglobin: 5.7 g/dL — CL (ref 12.0–15.0)
MCH: 29.1 pg (ref 26.0–34.0)
MCHC: 31.5 g/dL (ref 30.0–36.0)
MCV: 92.3 fL (ref 80.0–100.0)
Platelets: 196 10*3/uL (ref 150–400)
RBC: 1.96 MIL/uL — ABNORMAL LOW (ref 3.87–5.11)
RDW: 15.3 % (ref 11.5–15.5)
WBC: 7.6 10*3/uL (ref 4.0–10.5)
nRBC: 0 % (ref 0.0–0.2)

## 2018-10-10 LAB — GLUCOSE, CAPILLARY
Glucose-Capillary: 98 mg/dL (ref 70–99)
Glucose-Capillary: 126 mg/dL — ABNORMAL HIGH (ref 70–99)
Glucose-Capillary: 117 mg/dL — ABNORMAL HIGH (ref 70–99)
Glucose-Capillary: 230 mg/dL — ABNORMAL HIGH (ref 70–99)

## 2018-10-10 LAB — RENAL FUNCTION PANEL
Albumin: 2.6 g/dL — ABNORMAL LOW (ref 3.5–5.0)
Anion gap: 11 (ref 5–15)
BUN: 39 mg/dL — ABNORMAL HIGH (ref 6–20)
CO2: 14 mmol/L — ABNORMAL LOW (ref 22–32)
Calcium: 7.8 mg/dL — ABNORMAL LOW (ref 8.9–10.3)
Chloride: 110 mmol/L (ref 98–111)
Creatinine, Ser: 6.5 mg/dL — ABNORMAL HIGH (ref 0.44–1.00)
GFR calc Af Amer: 8 mL/min — ABNORMAL LOW (ref >60)
GFR calc non Af Amer: 7 mL/min — ABNORMAL LOW (ref >60)
Glucose, Bld: 125 mg/dL — ABNORMAL HIGH (ref 70–99)
Phosphorus: 5.7 mg/dL — ABNORMAL HIGH (ref 2.5–4.6)
Potassium: 5.3 mmol/L — ABNORMAL HIGH (ref 3.5–5.1)
Sodium: 135 mmol/L (ref 135–145)

## 2018-10-10 LAB — PREPARE RBC (CROSSMATCH)

## 2018-10-10 LAB — HEPATITIS B SURFACE ANTIGEN: Hepatitis B Surface Ag: NEGATIVE

## 2018-10-10 MED ORDER — OXYCODONE HCL 5 MG PO TABS
10.0000 mg | ORAL_TABLET | ORAL | Status: DC | PRN
  Administered 2018-10-10 – 2018-10-16 (×18): 10 mg via ORAL
  Filled 2018-10-10 (×19): qty 2

## 2018-10-10 MED ORDER — SODIUM CHLORIDE 0.9 % IV SOLN
125.0000 mg | Freq: Every day | INTRAVENOUS | Status: AC
  Administered 2018-10-10 – 2018-10-14 (×5): 125 mg via INTRAVENOUS
  Filled 2018-10-10 (×5): qty 10

## 2018-10-10 MED ORDER — SODIUM CHLORIDE 0.9% IV SOLUTION
Freq: Once | INTRAVENOUS | Status: AC
  Administered 2018-10-10: 13:00:00 via INTRAVENOUS

## 2018-10-10 MED ORDER — SODIUM BICARBONATE 650 MG PO TABS
1300.0000 mg | ORAL_TABLET | Freq: Three times a day (TID) | ORAL | Status: DC
  Administered 2018-10-10 – 2018-10-16 (×18): 1300 mg via ORAL
  Filled 2018-10-10 (×18): qty 2

## 2018-10-10 NOTE — Progress Notes (Signed)
[facility] PROGRESS NOTE  Assessment/ Plan: Pt is a 43 y.o. yo female with poorly controlled diabetes, hypertension, HIV CD4 count 450, CKD stage V followed by Dr. 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  #CKD stage V due to poorly controlled diabetes and hypertension: Serum creatinine level is stable at 6.5.  She has some nausea however no overt uremic symptoms.  She has mild hyperkalemia and acidosis.  Increased dose of sodium bicarbonate today.  She had vein mapping done on 07/08/2018, will consult [facility] for permanent access placement.  Mild worsening renal failure can be contributed by severe anemia.  No need for urgent dialysis at this time.  I have discussed with the patient that if there is no improvement in electrolytes or renal function she may need to start dialysis soon.  #Hyperkalemia: Recommend low potassium diet.  Continue Lokelma every day.  Monitor labs  #Anemia of CKD: Acute worsening likely due to acute blood loss.  Agree with blood transfusion.  Iron saturation 6%, I will order IV iron and continue ESA.  #Secondary hyperparathyroidism: Continue PhosLo, monitor phosphorus and calcium level.  We will pending.  #PAD status post left BKA: Per Ortho had febrile episode today morning.  #HIV on medication, CD4 count 150 in May 2019  #Hypertension: Continue current antihypertensive medication.  Subjective: Seen and examined at bedside.  Denied headache, dizziness, vomiting.  Chest pain or shortness of breath.  Reports good urine output. Objective Vital signs in last 24 hours: Vitals:   10/09/18 2040 10/10/18 0511 10/10/18 0925 10/10/18 1005  BP: (!) 157/87 (!) 152/77 (!) 152/77 (!) 150/75  Pulse: (!) 101 93 93 92  Resp: 18 18  16   Temp: 99.8 F (37.7 C)   (!) 101 F (38.3 C)  TempSrc: Oral   Oral  SpO2: 100% 100%  100%  Weight:      Height:       Weight  change:   Intake/Output Summary (Last 24 hours) at 10/10/2018 1127 Last data filed at 10/09/2018 1400 Gross per 24 hour  Intake 248.69 ml  Output -  Net 248.69 ml       Labs: Basic Metabolic Panel: Recent Labs  Lab 10/08/18 0312 10/09/18 0433 10/10/18 0321  NA 135 135 135  K 5.5* 5.5* 5.3*  CL 113* 109 110  CO2 13* 17* 14*  GLUCOSE 117* 140* 125*  BUN 45* 43* 39*  CREATININE 6.24* 6.47* 6.50*  CALCIUM 7.7* 7.9* 7.8*  PHOS  --   --  5.7*   Liver Function Tests: Recent Labs  Lab 10/06/18 2222 10/10/18 0321  AST 11*  --   ALT 8  --   ALKPHOS 101  --   BILITOT 0.1*  --   PROT 7.5  --   ALBUMIN 3.2* 2.6*   No results for input(s): LIPASE, AMYLASE in the last 168 hours. No results for input(s): AMMONIA in the last 168 hours. CBC: Recent Labs  Lab 10/06/18 2222 10/07/18 0403 10/08/18 0312 10/10/18 0321  WBC 7.6  --  6.2 7.6  NEUTROABS 4.8  --   --   --   HGB 8.0* 11.2* 7.3* 5.7*  HCT 26.7* 33.0* 23.6* 18.1*  MCV 94.3  --  92.5 92.3  PLT 278  --  260 196   Cardiac Enzymes: No results for input(s): CKTOTAL, CKMB, CKMBINDEX, TROPONINI in the last 168 hours. CBG: Recent Labs  Lab  10/09/18 0854 10/09/18 1259 10/09/18 1658 10/09/18 2038 10/10/18 0808  GLUCAP 133* 72 145* 124* 98    Iron Studies:  Recent Labs    10/09/18 1405  IRON 13*  TIBC 227*  FERRITIN 92   Studies/Results: No results found.  Medications: Infusions: . sodium chloride    . sodium chloride 10 mL/hr at 10/09/18 1400  . methocarbamol (ROBAXIN) IV Stopped (10/09/18 2376)    Scheduled Medications: . sodium chloride   Intravenous Once  . sodium chloride   Intravenous Once  . amLODipine  10 mg Oral Daily  . calcium acetate  1,334 mg Oral TID WC  . citalopram  20 mg Oral Daily  . darbepoetin (ARANESP) injection - NON-DIALYSIS  60 mcg Subcutaneous Q Mon-1800  . docusate sodium  100 mg Oral BID  . dolutegravir  50 mg Oral Q breakfast   And  . rilpivirine  25 mg Oral Q  breakfast  . gabapentin  300 mg Oral Daily  . heparin  5,000 Units Subcutaneous Q8H  . insulin aspart  0-15 Units Subcutaneous TID WC  . insulin glargine  15 Units Subcutaneous Q2200  . labetalol  200 mg Oral BID  . sodium bicarbonate  1,300 mg Oral TID  . sodium zirconium cyclosilicate  10 g Oral Daily    have reviewed scheduled and prn medications.  Physical Exam: General:NAD, comfortable Heart:RRR, s1s2 nl, rubs Lungs:clear b/l, no crackle Abdomen:soft, Non-tender, non-distended Extremities:No edema Neurology: Alert awake, no asterixis  Cyniah Gossard Prasad Metzli Pollick 10/10/2018,11:27 AM  LOS: 3 days

## 2018-10-10 NOTE — Progress Notes (Addendum)
   Subjective: [name] Hb this morning dropped from 7.3 yesterday to 5.7. This morning she denies dizziness, shortness of breath, or pain anywhere other than her surgical site. She had some pain overnight, but overall her pain has improved today. All questions and concerns were addressed.   Objective:  Vital signs in last 24 hours: Vitals:   10/09/18 0933 10/09/18 1322 10/09/18 2040 10/10/18 0511  BP: (!) 174/97 131/73 (!) 157/87 (!) 152/77  Pulse: 97 87 (!) 101 93  Resp:  16 18 18   Temp: 99 F (37.2 C) 98.7 F (37.1 C) 99.8 F (37.7 C)   TempSrc: Oral Oral Oral   SpO2: 100% 100% 100% 100%  Weight:      Height:       General: awake, alert, lying in bed in NAD CV: RRR; no murmurs Pulm: normal respiratory effort; lungs CTA bilaterally Abd: soft, non-tender, non-distended Ext: no edema; L BKA with stocking over stump; serosanguinous fluid in wound vac cannister  Assessment/Plan:  Principal Problem:   Osteomyelitis (HCC) Active Problems:   Diabetes mellitus type 2 in obese (HCC)   Essential hypertension   Human immunodeficiency virus (HIV) disease (HCC)   CKD (chronic kidney disease), stage V (HCC)   Wound infection s/p L transmetatarsal amputation   Dehiscence of amputation stump (HCC)   Moderate protein-calorie malnutrition (HCC)  Kathy Frank is a 43 year old female with past medical history of insulin-dependent diabetes, HTN, CKD stage V, and HIV with 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 She presented with pain and drainage of the left foot. She underwent transtibial amputation on 2/29/2020given the patient's comorbid morbid conditions with poor wound healing.  Osteomyelitis, left foot - S/p L BKA amputation on 10/08/2018. Patient's pain is still present, but has improved.  - PT recommending home health PT with 24 hour assistance. Case management consulted for assistance with discharge planning. Plan - Ortho following,  appreciate recs - Pain control: PO oxycodone 10 mg every 4 hours as needed; discontinued IV dilaudid - Continue gabapentin 300mg  TID - Continue Robaxin q6hrs prn for muscle spasms - Outpatient f/u with ortho 1 week post-op. Continue wound vac until then.   Acute blood loss anemia  - Patient has history of anemia 2/2 CKD. Baseline hemoglobin 8-9. Decreased from 7.3 yesterday (post op day 1) to 5.7 today. There is serosanguinous fluid in her wound vac cannister. There are no other signs of bleeding from elsewhere in the body.  Plan - Transfuse 2u pRBCs. Recheck Hb after transfusion. Transfusion goal Hb >7.  - Start aranesp per nephro  CKD stage V - Creatinine 6.71 on admission. Cr 6.5. This is about baseline for her. K decreased from 5.5 to 5.3. - Nephrology will involve [facility] for HD access [facility], appreciate recs - Lokelma 10mg  daily - Phoslo, aranesp, sodium bicarb per nephro  - trend BMP   Diabetes mellitus - Blood sugars within goal Plan - Continue Lantus 15 units nightly - SSI  Hypertension - Blood pressures elevated to 152/77 this morning. Labetalol was increased to 200mg  BID yesterday. If still elevated tomorrow, can increase to 200mg  TID.  Plan - Continue amlodipine 10 mg daily and labetalol200mg  twice daily  Dispo: Anticipated discharge in approximately 1-2 day(s).   Jarris Kortz, [name], MD 10/10/2018, 6:10 AM Pager: [phone]

## 2018-10-10 NOTE — Progress Notes (Signed)
Patient temp. 100.5 after 2nd blood transfusion, asymptomatic, alert and oriented x 4, vital signs appropriate for patient baseline except temp.( see flowsheets) attending doctor notified, tylenol will  Be given, and nurse will continue to monitor patient.

## 2018-10-10 NOTE — Progress Notes (Signed)
CRITICAL VALUE ALERT  Critical Value: Hemoglobin 5.7  Date & Time Notied: 10/10/2018  Provider Notified: Internal Medicine Teaching Service  Orders Received/Actions taken: Transfuse 1 unit PRBCs

## 2018-10-10 NOTE — Progress Notes (Signed)
Physical Therapy Treatment Patient Details Name: Kathy Frank MRN: [id number] DOB: Oct 12, 1975 Today's Date: 10/10/2018    History of Present Illness Kathy Frank is a 43 y.o.-year-old female who has dehiscence of her transmetatarsal amputation on the left.  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) , Normocytic anemia, diabetes mellitus     PT Comments     Continuing work on functional mobility and activity tolerance;  Low Hgb today precluded much work on walking, but [name] was agreeable to bed therapeutic exercise, and participated beautifully, including spending time in prone-lying for hip flexor stretch and hip extension exercises against gravity   Follow Up Recommendations  Outpatient PT;Supervision/Assistance - 24 hour(Saving PT visits for prosthesis trng)     Equipment Recommendations  3in1 (PT)    Recommendations for Other Services       Precautions / Restrictions Precautions Precautions: Fall Precaution Comments: Fall risk greatly reduced with use of RW Restrictions Weight Bearing Restrictions: Yes LLE Weight Bearing: Non weight bearing    Mobility  Bed Mobility Overal bed mobility: Needs Assistance Bed Mobility: Rolling;Supine to Sit;Sit to Supine Rolling: Supervision   Supine to sit: Supervision Sit to supine: Supervision   General bed mobility comments: Supervision mostly for VAC line; Able to roll all the way prone for therex  Transfers                    Ambulation/Gait                 Stairs             Wheelchair Mobility    Modified Rankin (Stroke Patients Only)       Balance                                             Cognition Arousal/Alertness: Awake/alert Behavior During Therapy: WFL for tasks assessed/performed Overall Cognitive Status: Within Functional Limits for tasks assessed                                        Exercises Amputee Exercises Quad Sets: AROM;Left;10 reps Towel Squeeze: AROM;Both;10 reps Hip Extension: AROM;Left;10 reps;Prone Hip ABduction/ADduction: AROM;Left;10 reps;Sidelying Straight Leg Raises: AROM;Left;10 reps;Supine Other Exercises Other Exercises: Bolstered bridging x10  Other Exercises: Hamstring stretch with 15 sec hold x3 Other Exercises: isometric hip abduction with strap around knees x10    General Comments        Pertinent Vitals/Pain Pain Assessment: Faces Faces Pain Scale: Hurts little more Pain Location: Residucal limb Pain Descriptors / Indicators: Discomfort Pain Intervention(s): Monitored during session    Home Living                      Prior Function            PT Goals (current goals can now be found in the care plan section) Acute Rehab PT Goals Patient Stated Goal: get to prosthesis PT Goal Formulation: With patient Time For Goal  Achievement: 10/23/18 Potential to Achieve Goals: Good Progress towards PT goals: Progressing toward goals    Frequency    Min 3X/week      PT Plan Current plan remains appropriate    Co-evaluation              AM-PAC PT "6 Clicks" Mobility   Outcome Measure  Help needed turning from your back to your side while in a flat bed without using bedrails?: None Help needed moving from lying on your back to sitting on the side of a flat bed without using bedrails?: None Help needed moving to and from a bed to a chair (including a wheelchair)?: A Little Help needed standing up from a chair using your arms (e.g., wheelchair or bedside chair)?: A Little Help needed to walk in hospital room?: A Little Help needed  climbing 3-5 steps with a railing? : A Lot 6 Click Score: 19    End of Session Equipment Utilized During Treatment: Gait belt(for isometric hip abduction) Activity Tolerance: Patient tolerated treatment well Patient left: in bed;with call bell/phone within reach Nurse Communication: Mobility status PT Visit Diagnosis: Unsteadiness on feet (R26.81);Other abnormalities of gait and mobility (R26.89);Pain Pain - Right/Left: Left Pain - part of body: Leg     Time: 3754-3606 PT Time Calculation (min) (ACUTE ONLY): 25 min  Charges:  $Therapeutic Exercise: 23-37 mins                     Roney Marion, PT  Acute Rehabilitation Services Pager [phone] Office [phone]    Colletta [place] 10/10/2018, 4:59 PM

## 2018-10-10 NOTE — Consult Note (Addendum)
Hospital Consult    Reason for Consult:  Vascular access Requesting Physician:  Dr. Carolin Sicks MRN #:  [id number]  History of Present Illness: This is a 43 y.o. female with a PMH significant for long-term poorly controlled diabetes and hypertension, HIV on medication, CKD stage 5 followed by Dr. Posey Pronto, PAD s/p LBKA 10/08/2018 of osteomyelitis/gangrene of the left foot. She had a previous left transmetatarsal amputation on 07/08/2018 which had worsening drainage and dehiscence leading to the 10/08/2018 LBKA. She has had hyperkalemia without EKG changes, metabolic acidosis, and anemia of CKD.   Dialysis has been discussed with patient, however she missed last appointment with Dr. Posey Pronto. Vascular consults with her today for vascular access placement.   The pt is NOT on a statin for cholesterol management.  The pt IS on a daily aspirin.  The pt IS on Norvasc and Labetolol for hypertension.   The pt IS diabetic. CKD stage 5 Tobacco hx: Quit 4 years ago  Past Medical History:  Diagnosis Date  . CKD (chronic kidney disease) stage 5, GFR less than 15 ml/min (HCC) 06/19/2016   06/30/16: Seen by Dr. Posey Pronto [Nephrology]. Suspect etiologies to include diabetes and hypertension though cannot exclude HIV-associated nephropathy. Advised her against from using excessive Goody powder.  . Contact dermatitis 04/21/2016   07/29/16: Skin biopsy performed by [facility] notable for acute spongiotic dermatitis consistent with contact dermatitis.  . Cyclical vomiting    THC induced???  . Depression 06/28/2006   Qualifier: Diagnosis of  By: Riccardo Dubin MD, [place]    . Diabetes mellitus type 2 in obese ([place]) 06/28/1994  . DKA (diabetic ketoacidoses) ([place]) 12/2014  . Erosive esophagitis   . Esophageal reflux   . Essential hypertension 11/16/2013  . Gastroparesis    ? diabetic  . GERD (gastroesophageal reflux disease)   . Human immunodeficiency virus (HIV) disease ([place]) 04/23/2016  . Hypertension   .  Moderate nonproliferative diabetic retinopathy of both eyes ([place]) 11/21/2014   11/14/14: Noted on retinal imaging; needs follow-up imaging in 6 months  05/22/16: Noted on retinal imaging again; needs follow-up imaging in 6 months  . Normocytic anemia   . TOBACCO USER 06/28/2006   Qualifier: Diagnosis of  By: Riccardo Dubin MD, Sherren Mocha    . Type II diabetes mellitus ([place]) 1994   diagnosed around 1994    Past Surgical History:  Procedure Laterality Date  . AMPUTATION Left 07/08/2018   Procedure: AMPUTATION FORTH RAY LEFT FOOT;  Surgeon: [name], MD;  Location: [place];  Service: Orthopedics;  Laterality: Left;  . AMPUTATION Left 08/09/2018   Procedure: Left Transmetatarsal Amputation;  Surgeon: [name], MD;  Location: [name];  Service: Orthopedics;  Laterality: Left;  . AMPUTATION Left 10/08/2018   Procedure: LEFT BELOW KNEE AMPUTATION;  Surgeon: [name], MD;  Location: [facility];  Service: Orthopedics;  Laterality: Left;  . LOWER EXTREMITY ANGIOGRAPHY N/A 07/05/2018   Procedure: LOWER EXTREMITY ANGIOGRAPHY;  Surgeon: Serafina Mitchell, MD;  Location: [facility];  Service: Cardiovascular;  Laterality: N/A;  . PERIPHERAL VASCULAR BALLOON ANGIOPLASTY Left 07/05/2018   Procedure: PERIPHERAL VASCULAR BALLOON ANGIOPLASTY;  Surgeon: Serafina Mitchell, MD;  Location: [facility];  Service: Cardiovascular;  Laterality: Left;  SFA  . TUBAL LIGATION  2002    No Known Allergies  Prior to Admission medications   Medication Sig Start Date End Date Taking? Authorizing Provider  amLODipine (NORVASC) 10 MG tablet Take 1 tablet (10 mg total) by mouth  daily. 12/24/17 10/07/18 Yes Rice, Resa Miner, MD  aspirin 81 MG EC tablet Take 1 tablet (81 mg total) by mouth daily. 07/12/18  Yes Mosetta Anis, MD  citalopram (CELEXA) 20 MG tablet Take 1 tablet (20 mg total) by mouth daily. 08/12/18 10/07/18 Yes Carroll Sage, MD  Dolutegravir-Rilpivirine (JULUCA) 50-25 MG TABS Take 1 tablet by mouth  daily. Please take with largest meal of the day. 07/05/18  Yes Annia Belt, MD  Insulin Glargine (LANTUS) 100 UNIT/ML Solostar Pen Inject 15 Units into the skin daily at 10 pm. Patient taking differently: Inject 15 Units into the skin at bedtime.  12/24/17  Yes Rice, Resa Miner, MD  labetalol (NORMODYNE) 100 MG tablet TAKE 1 TABLET BY MOUTH 2 TIMES DAILY Patient taking differently: Take 100 mg by mouth 2 (two) times daily.  07/13/18  Yes Chundi, Vahini, MD  liraglutide (VICTOZA) 18 MG/3ML SOPN Inject 0.2 mLs (1.2 mg total) into the skin daily. 02/04/18  Yes Chundi, Vahini, MD  Multiple Vitamin (MULTIVITAMIN WITH MINERALS) TABS tablet Take 1 tablet by mouth daily. 12/24/17  Yes Rice, Resa Miner, MD  ondansetron (ZOFRAN) 4 MG tablet Take 1 tablet (4 mg total) by mouth daily as needed for nausea or vomiting. 08/19/18 08/19/19 Yes Alphonzo Grieve, MD  polyethylene glycol (MIRALAX / GLYCOLAX) packet Take 17 g by mouth daily. Patient taking differently: Take 17 g by mouth daily as needed for mild constipation.  08/12/18  Yes Carroll Sage, MD  senna (SENOKOT) 8.6 MG TABS tablet Take 2 tablets (17.2 mg total) by mouth 2 (two) times daily. Patient taking differently: Take 2 tablets by mouth daily as needed for mild constipation.  08/12/18  Yes Carroll Sage, MD  silver sulfADIAZINE (SILVADENE) 1 % cream Apply 1 application topically daily. 08/01/18  Yes Rayburn, Neta Mends, PA-C  sodium bicarbonate 650 MG tablet Take 1 tablet (650 mg total) by mouth 2 (two) times daily. 12/24/17  Yes Rice, Resa Miner, MD  triamcinolone cream (KENALOG) 0.1 % Apply 1 application topically 2 (two) times daily. Patient taking differently: Apply 1 application topically 2 (two) times daily as needed (itching).  02/04/18  Yes Chundi, Vahini, MD  glucose blood (ACCU-CHEK GUIDE) test strip USE TO TEST BLOOD SUGAR 3 TIMES DAILY . Diagnosis code  E11.69, E66.9 07/09/17   Lars Mage, MD  sulfamethoxazole-trimethoprim  (BACTRIM DS,SEPTRA DS) 800-160 MG tablet Take 1 tablet by mouth 2 (two) times daily. Patient not taking: Reported on 10/07/2018 09/15/18   Rayburn, Neta Mends, PA-C    Social History   Socioeconomic History  . Marital status: Single    Spouse name: Not on file  . Number of children: Not on file  . Years of education: 28  . Highest education level: Not on file  Occupational History    Employer: UNEMPLOYED  Social Needs  . Financial resource strain: Not on file  . Food insecurity:    Worry: Not on file    Inability: Not on file  . Transportation needs:    Medical: Not on file    Non-medical: Not on file  Tobacco Use  . Smoking status: Former Smoker    Packs/day: 0.10    Years: 10.00    Pack years: 1.00    Types: Cigarettes    Start date: 11/17/2013    Last attempt to quit: 09/09/2014    Years since quitting: 4.0  . Smokeless tobacco: Never Used  Substance and Sexual Activity  . Alcohol use: Yes  Comment: Occasionally.  . Drug use: Yes    Frequency: 4.0 times per week    Types: Marijuana  . Sexual activity: Not on file  Lifestyle  . Physical activity:    Days per week: Not on file    Minutes per session: Not on file  . Stress: Not on file  Relationships  . Social connections:    Talks on phone: Not on file    Gets together: Not on file    Attends religious service: Not on file    Active member of club or organization: Not on file    Attends meetings of clubs or organizations: Not on file    Relationship status: Not on file  . Intimate partner violence:    Fear of current or ex partner: Not on file    Emotionally abused: Not on file    Physically abused: Not on file    Forced sexual activity: Not on file  Other Topics Concern  . Not on file  Social History Narrative  . Not on file     Family History  Problem Relation Age of Onset  . Diabetes Mother   . Diabetes Brother   . Diabetes Daughter   . Diabetes Daughter   . Mental retardation Brother         died from PNA  . Diabetes Maternal Grandmother     ROS: [x]  Positive   [ ]  Negative   [ ]  All sytems reviewed and are negative  Cardiac: []  chest pain/pressure []  palpitations []  SOB lying flat []  DOE  Vascular: []  pain in legs while walking []  pain in legs at rest []  pain in legs at night []  non-healing ulcers []  hx of DVT []  swelling in legs  Pulmonary: []  productive cough []  asthma/wheezing []  home O2  Neurologic: []  weakness in []  arms []  legs []  numbness in []  arms []  legs []  hx of CVA []  mini stroke [] difficulty speaking or slurred speech []  temporary loss of vision in one eye []  dizziness  Hematologic: []  hx of cancer []  bleeding problems []  problems with blood clotting easily  Endocrine:   [X]  diabetes []  thyroid disease  GI []  vomiting blood []  blood in stool  GU: [X]  CKD/renal failure []  HD--[]  M/W/F or []  T/T/S []  burning with urination []  blood in urine  Psychiatric: []  anxiety []  depression  Musculoskeletal: []  arthritis []  joint pain  Integumentary: []  rashes []  ulcers  Constitutional: []  fever []  chills   Physical Examination  Vitals:   10/10/18 1415 10/10/18 1505  BP: 122/70 129/71  Pulse: 88 88  Resp: 18 18  Temp: (!) 100.9 F (38.3 C) 100 F (37.8 C)  SpO2: 100% 100%   Body mass index is 26.63 kg/m.  General:  WDWN in NAD Gait: Not observed HENT: WNL, normocephalic Pulmonary: normal non-labored breathing, without Rales, rhonchi,  wheezing Cardiac: regular, without  Murmurs, rubs or gallops; without carotid bruits Abdomen: nondistended, soft, NT/ND, no masses Skin: without rashes Vascular Exam/Pulses:  Right Left  Radial 2+ (normal) 2+ (normal)  Ulnar Unable to palpate Unable to palpate  Brachial 2+ (normal) 2+ (normal)   Extremities: without ischemic changes, without Gangrene , without cellulitis; without open wounds;  Musculoskeletal: no muscle wasting or atrophy  Neurologic: A&O X 3;  No focal  weakness or paresthesias are detected; speech is fluent/normal Psychiatric:  The pt has Normal affect.   CBC    Component Value Date/Time   WBC 7.6 10/10/2018 0321  RBC 1.96 (L) 10/10/2018 0321   HGB 5.7 (LL) 10/10/2018 0321   HGB 9.8 (L) 07/18/2018 1133   HCT 18.1 (L) 10/10/2018 0321   HCT 19.3 (L) 10/09/2018 1405   PLT 196 10/10/2018 0321   PLT 417 07/18/2018 1133   MCV 92.3 10/10/2018 0321   MCV 89 07/18/2018 1133   MCH 29.1 10/10/2018 0321   MCHC 31.5 10/10/2018 0321   RDW 15.3 10/10/2018 0321   RDW 12.9 07/18/2018 1133   LYMPHSABS 1.7 10/06/2018 2222   LYMPHSABS 1.6 12/23/2017 1558   MONOABS 0.6 10/06/2018 2222   EOSABS 0.5 10/06/2018 2222   EOSABS 0.2 12/23/2017 1558   BASOSABS 0.1 10/06/2018 2222   BASOSABS 0.1 12/23/2017 1558    BMET    Component Value Date/Time   NA 135 10/10/2018 0321   NA 138 12/23/2017 1558   K 5.3 (H) 10/10/2018 0321   CL 110 10/10/2018 0321   CO2 14 (L) 10/10/2018 0321   GLUCOSE 125 (H) 10/10/2018 0321   BUN 39 (H) 10/10/2018 0321   BUN 28 (H) 12/23/2017 1558   CREATININE 6.50 (H) 10/10/2018 0321   CREATININE 1.68 (H) 05/27/2016 1018   CALCIUM 7.8 (L) 10/10/2018 0321   GFRNONAA 7 (L) 10/10/2018 0321   GFRNONAA 79 11/14/2014 1154   GFRAA 8 (L) 10/10/2018 0321   GFRAA >89 11/14/2014 1154    COAGS: Lab Results  Component Value Date   INR 1.02 08/11/2018   INR 1.19 07/02/2018     Non-Invasive Vascular Imaging:  Vein Mapping 07/05/2018 +-----------------+-------------+----------+--------------+ Right Cephalic   Diameter (cm)Depth (cm)   Findings    +-----------------+-------------+----------+--------------+ Shoulder             0.18        0.00                  +-----------------+-------------+----------+--------------+ Prox upper arm       0.17        0.25                  +-----------------+-------------+----------+--------------+ Mid upper arm        1.17        0.27                   +-----------------+-------------+----------+--------------+ Dist upper arm       0.18        0.32                  +-----------------+-------------+----------+--------------+ Antecubital fossa    0.18        0.18                  +-----------------+-------------+----------+--------------+ Prox forearm         0.16        0.32                  +-----------------+-------------+----------+--------------+ Mid forearm          0.17        0.22                  +-----------------+-------------+----------+--------------+ Wrist                                   not visualized +-----------------+-------------+----------+--------------+  +-----------------+-------------+----------+--------------+ Right Basilic    Diameter (cm)Depth (cm)   Findings    +-----------------+-------------+----------+--------------+ Shoulder  not visualized +-----------------+-------------+----------+--------------+ Prox upper arm                          not visualized +-----------------+-------------+----------+--------------+ Mid upper arm                           not visualized +-----------------+-------------+----------+--------------+ Dist upper arm                          not visualized +-----------------+-------------+----------+--------------+ Antecubital fossa    0.20        0.75                  +-----------------+-------------+----------+--------------+ Prox forearm                            not visualized +-----------------+-------------+----------+--------------+ Mid forearm                             not visualized +-----------------+-------------+----------+--------------+ Distal forearm                          not visualized +-----------------+-------------+----------+--------------+ Elbow                                   not  visualized +-----------------+-------------+----------+--------------+ Wrist                                   not visualized +-----------------+-------------+----------+--------------+  +-----------------+-------------+----------+---------+ Left Cephalic    Diameter (cm)Depth (cm)Findings  +-----------------+-------------+----------+---------+ Shoulder             0.25        0.44   branching +-----------------+-------------+----------+---------+ Prox upper arm       0.20        0.24             +-----------------+-------------+----------+---------+ Mid upper arm        0.19        0.38             +-----------------+-------------+----------+---------+ Dist upper arm       0.18        0.23             +-----------------+-------------+----------+---------+ Antecubital fossa    0.22        0.23             +-----------------+-------------+----------+---------+ Prox forearm         0.27        0.35             +-----------------+-------------+----------+---------+ Mid forearm          0.18        0.19             +-----------------+-------------+----------+---------+ Wrist                0.17        0.13             +-----------------+-------------+----------+---------+  +-----------------+-------------+----------+----------------+ Left Basilic     Diameter (cm)Depth (cm)    Findings     +-----------------+-------------+----------+----------------+ Dist upper arm       0.29  0.88                    +-----------------+-------------+----------+----------------+ Antecubital fossa    0.18        0.93                    +-----------------+-------------+----------+----------------+ Prox forearm         0.15        0.19                    +-----------------+-------------+----------+----------------+ Mid forearm                             Non  compressible +-----------------+-------------+----------+----------------+    ASSESSMENT/PLAN: This is a 43 y.o. female with CKD 5 and HIV on meds, in need of dialysis access. Pt did have fever this morning (10/10/2018) of 101 (oral).  - Right hand dominant - May require left arm AVG but will look at veins intraoperatively. - Dr. Oneida Alar to see later today   Rene Kocher, AGACNP-BC [facility] [phone]  History and exam as above.  Pt has inadequate vein most likely for fistula. Will most likely need left upper arm graft. Was febrile to 101 today.  Will need to be afebrile with no leukocytosis for 48 hr prior to placing graft  Will follow   Ruta Hinds, MD [facility] [place]: [phone] Pager: [phone]

## 2018-10-10 NOTE — Care Management (Signed)
Case Manager spoke with patient concerning discharge plan and DME. CM spoke with patient concerning using her PT visits nor versus after she receives her prosthesis. Patient prefers to wait. She says she has DME. Will have support at discharge.

## 2018-10-10 NOTE — Progress Notes (Signed)
Pre-vital signs taken for pre blood administration.  Temp 101.  Medicated for fever and will recheck before starting blood transfusion.

## 2018-10-10 NOTE — Progress Notes (Signed)
RN attempting to start transfusion. Blood Bank reports they just received type & screen sample and will notify RN when blood is approved for pickup.

## 2018-10-10 NOTE — Progress Notes (Signed)
Dr Phillips Odor paged- awaiting for the ok to start 1st unit PRBCs

## 2018-10-11 ENCOUNTER — Inpatient Hospital Stay (HOSPITAL_COMMUNITY): Payer: Medicaid Other

## 2018-10-11 DIAGNOSIS — Z01810 Encounter for preprocedural cardiovascular examination: Secondary | ICD-10-CM

## 2018-10-11 LAB — TYPE AND SCREEN
ABO/RH(D): B POS
Antibody Screen: NEGATIVE
Unit division: 0
Unit division: 0

## 2018-10-11 LAB — PARATHYROID HORMONE, INTACT (NO CA): PTH: 252 pg/mL — ABNORMAL HIGH (ref 15–65)

## 2018-10-11 LAB — BPAM RBC
Blood Product Expiration Date: 202003272359
Blood Product Expiration Date: 202003272359
ISSUE DATE / TIME: 202003021440
ISSUE DATE / TIME: 202003021801
Unit Type and Rh: 7300
Unit Type and Rh: 7300

## 2018-10-11 LAB — CBC
HCT: 22.2 % — ABNORMAL LOW (ref 36.0–46.0)
Hemoglobin: 7.1 g/dL — ABNORMAL LOW (ref 12.0–15.0)
MCH: 28.1 pg (ref 26.0–34.0)
MCHC: 32 g/dL (ref 30.0–36.0)
MCV: 87.7 fL (ref 80.0–100.0)
Platelets: 194 10*3/uL (ref 150–400)
RBC: 2.53 MIL/uL — ABNORMAL LOW (ref 3.87–5.11)
RDW: 16.4 % — ABNORMAL HIGH (ref 11.5–15.5)
WBC: 7.8 10*3/uL (ref 4.0–10.5)
nRBC: 0 % (ref 0.0–0.2)

## 2018-10-11 LAB — RENAL FUNCTION PANEL
Albumin: 2.5 g/dL — ABNORMAL LOW (ref 3.5–5.0)
Anion gap: 8 (ref 5–15)
BUN: 39 mg/dL — ABNORMAL HIGH (ref 6–20)
CO2: 18 mmol/L — ABNORMAL LOW (ref 22–32)
Calcium: 7.7 mg/dL — ABNORMAL LOW (ref 8.9–10.3)
Chloride: 110 mmol/L (ref 98–111)
Creatinine, Ser: 6.63 mg/dL — ABNORMAL HIGH (ref 0.44–1.00)
GFR calc Af Amer: 8 mL/min — ABNORMAL LOW (ref >60)
GFR calc non Af Amer: 7 mL/min — ABNORMAL LOW (ref >60)
Glucose, Bld: 89 mg/dL (ref 70–99)
Phosphorus: 5.7 mg/dL — ABNORMAL HIGH (ref 2.5–4.6)
Potassium: 5.5 mmol/L — ABNORMAL HIGH (ref 3.5–5.1)
Sodium: 136 mmol/L (ref 135–145)

## 2018-10-11 LAB — GLUCOSE, CAPILLARY
Glucose-Capillary: 81 mg/dL (ref 70–99)
Glucose-Capillary: 149 mg/dL — ABNORMAL HIGH (ref 70–99)
Glucose-Capillary: 61 mg/dL — ABNORMAL LOW (ref 70–99)
Glucose-Capillary: 143 mg/dL — ABNORMAL HIGH (ref 70–99)
Glucose-Capillary: 206 mg/dL — ABNORMAL HIGH (ref 70–99)

## 2018-10-11 LAB — HEMOGLOBIN AND HEMATOCRIT, BLOOD
HCT: 23.9 % — ABNORMAL LOW (ref 36.0–46.0)
Hemoglobin: 7.7 g/dL — ABNORMAL LOW (ref 12.0–15.0)

## 2018-10-11 MED ORDER — LABETALOL HCL 200 MG PO TABS
200.0000 mg | ORAL_TABLET | Freq: Three times a day (TID) | ORAL | Status: DC
  Administered 2018-10-11 – 2018-10-16 (×15): 200 mg via ORAL
  Filled 2018-10-11 (×17): qty 1

## 2018-10-11 MED ORDER — HYDROMORPHONE HCL 1 MG/ML IJ SOLN
0.5000 mg | Freq: Once | INTRAMUSCULAR | Status: DC
  Filled 2018-10-11: qty 1

## 2018-10-11 MED ORDER — SODIUM ZIRCONIUM CYCLOSILICATE 10 G PO PACK
10.0000 g | PACK | Freq: Every day | ORAL | Status: AC
  Administered 2018-10-11 – 2018-10-13 (×3): 10 g via ORAL
  Filled 2018-10-11 (×2): qty 1

## 2018-10-11 MED ORDER — DARBEPOETIN ALFA 60 MCG/0.3ML IJ SOSY
60.0000 ug | PREFILLED_SYRINGE | INTRAMUSCULAR | Status: DC
  Administered 2018-10-11: 60 ug via SUBCUTANEOUS
  Filled 2018-10-11: qty 0.3

## 2018-10-11 NOTE — Progress Notes (Signed)
[facility] NEPHROLOGY PROGRESS NOTE  Assessment/ Plan: Pt is a 43 y.o. yo female with poorly controlled diabetes, hypertension, HIV CD4 count 450, CKD stage V followed by Dr. 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  #CKD stage V due to poorly controlled diabetes and hypertension: Serum creatinine level around baseline, cr 6.6.  She has some nausea however no overt uremic symptoms.  She has mild hyperkalemia and acidosis. -continue sodium bicarbonate.   -She had vein mapping done on 07/08/2018, VVS consulted for permanent access placement,on hold now because of low grade fever.  No need for urgent dialysis at this time.  I have discussed with the patient that if there is no improvement in electrolytes or renal function she may need to start dialysis soon.  #Hyperkalemia: Recommend low potassium diet.  Continue Lokelma every day.  Monitor labs  #Anemia of CKD: Acute worsening likely due to acute blood loss.  Received blood transfusion.  Iron saturation 6%, continue IV iron and continue ESA.  #Secondary hyperparathyroidism: Continue PhosLo, monitor phosphorus and calcium level.  PTH level 252.  #PAD status post left BKA: Per Ortho had febrile episode today morning.  #HIV on medication, CD4 count 150 in May 2019  #Hypertension: Continue current antihypertensive medication.  Subjective: Seen and examined at bedside.  Temperature 100.5 last night.  Denied nausea vomiting chest pain shortness of breath. Objective Vital signs in last 24 hours: Vitals:   10/10/18 2308 10/11/18 0428 10/11/18 0852 10/11/18 0900  BP:  (!) 145/75 (!) 145/75 (!) 145/75  Pulse:  86 86   Resp:  14    Temp: 99.5 F (37.5 C) 98.6 F (37 C)    TempSrc: Oral Oral    SpO2:  98%    Weight:      Height:       Weight change:   Intake/Output Summary (Last 24 hours) at 10/11/2018 1143 Last data filed at 10/11/2018  0900 Gross per 24 hour  Intake 2359.79 ml  Output -  Net 2359.79 ml       Labs: Basic Metabolic Panel: Recent Labs  Lab 10/09/18 0433 10/10/18 0321 10/11/18 0748  NA 135 135 136  K 5.5* 5.3* 5.5*  CL 109 110 110  CO2 17* 14* 18*  GLUCOSE 140* 125* 89  BUN 43* 39* 39*  CREATININE 6.47* 6.50* 6.63*  CALCIUM 7.9* 7.8* 7.7*  PHOS  --  5.7* 5.7*   Liver Function Tests: Recent Labs  Lab 10/06/18 2222 10/10/18 0321 10/11/18 0748  AST 11*  --   --   ALT 8  --   --   ALKPHOS 101  --   --   BILITOT 0.1*  --   --   PROT 7.5  --   --   ALBUMIN 3.2* 2.6* 2.5*   No results for input(s): LIPASE, AMYLASE in the last 168 hours. No results for input(s): AMMONIA in the last 168 hours. CBC: Recent Labs  Lab 10/06/18 2222  10/08/18 0312  10/10/18 0321 10/11/18 0030 10/11/18 0748  WBC 7.6  --  6.2  --  7.6  --  7.8  NEUTROABS 4.8  --   --   --   --   --   --   HGB 8.0*   < > 7.3*  --  5.7* 7.7* 7.1*  HCT 26.7*   < > 23.6*   < > 18.1* 23.9* 22.2*  MCV  94.3  --  92.5  --  92.3  --  87.7  PLT 278  --  260  --  196  --  194   < > = values in this interval not displayed.   Cardiac Enzymes: No results for input(s): CKTOTAL, CKMB, CKMBINDEX, TROPONINI in the last 168 hours. CBG: Recent Labs  Lab 10/10/18 0808 10/10/18 1115 10/10/18 1559 10/10/18 2020 10/11/18 0829  GLUCAP 98 126* 117* 230* 81    Iron Studies:  Recent Labs    10/09/18 1405  IRON 13*  TIBC 227*  FERRITIN 92   Studies/Results: No results found.  Medications: Infusions: . sodium chloride    . sodium chloride 10 mL/hr at 10/09/18 1400  . ferric gluconate (FERRLECIT/NULECIT) IV 125 mg (10/11/18 1010)  . methocarbamol (ROBAXIN) IV Stopped (10/09/18 6389)    Scheduled Medications: . amLODipine  10 mg Oral Daily  . calcium acetate  1,334 mg Oral TID WC  . citalopram  20 mg Oral Daily  . darbepoetin (ARANESP) injection - NON-DIALYSIS  60 mcg Subcutaneous Q Tue-1800  . docusate sodium  100 mg  Oral BID  . dolutegravir  50 mg Oral Q breakfast   And  . rilpivirine  25 mg Oral Q breakfast  . gabapentin  300 mg Oral Daily  . heparin  5,000 Units Subcutaneous Q8H  .  HYDROmorphone (DILAUDID) injection  0.5 mg Intravenous Once  . insulin aspart  0-15 Units Subcutaneous TID WC  . insulin glargine  15 Units Subcutaneous Q2200  . labetalol  200 mg Oral TID  . sodium bicarbonate  1,300 mg Oral TID  . sodium zirconium cyclosilicate  10 g Oral Daily    have reviewed scheduled and prn medications.  Physical Exam: General:NAD, comfortable Heart:RRR, s1s2 nl, rubs Lungs: Clear b/l, no crackle Abdomen:soft, Non-tender, non-distended Extremities: No edema Neurology: Alert awake, no asterixis  Jahron Hunsinger Prasad Will Schier 10/11/2018,11:43 AM  LOS: 4 days

## 2018-10-11 NOTE — Progress Notes (Signed)
Subjective: No overnight events. She tolerated the blood transfusion well. This morning, she reports having severe spasm-like pain overnight, however this morning her pain has improved. Denies dyspnea, cough. She has been using incentive spirometry as instructed. Working well with PT. No urinary symptoms.   Objective:  Vital signs in last 24 hours: Vitals:   10/10/18 2010 10/10/18 2116 10/10/18 2308 10/11/18 0428  BP: (!) 145/76 (!) 166/90  (!) 145/75  Pulse: 88 89  86  Resp: 16 18  14   Temp: 98.7 F (37.1 C) (!) 100.5 F (38.1 C) 99.5 F (37.5 C) 98.6 F (37 C)  TempSrc: Oral Oral Oral Oral  SpO2: 98% 100%  98%  Weight:      Height:       General: awake, alert, lying in bed in NAD CV: RRR; no murmurs,  Pulm: normal respiratory effort; lungs CTA bilaterally Ext: no edema; L BKA wound site examined without increased warmth, erythema or edema, unable to visualize incision because wound vac is in place; serosanguinous fluid in wound vac canister.   Assessment/Plan:  Principal Problem:   Osteomyelitis ([place]) Active Problems:   Diabetes mellitus type 2 in obese ([place])   Essential hypertension   Human immunodeficiency virus (HIV) disease ([place])   CKD (chronic kidney disease), stage V ([place])   Wound infection s/p L transmetatarsal amputation   Dehiscence of amputation stump ([place])   Moderate protein-calorie malnutrition ([place])  Kathy Frank is a 43 year old female with past medical history of insulin-dependent diabetes, HTN, CKD stage V, and HIV with 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 She presented withpain and drainage of the left foot. She underwenttranstibialamputation on 2/29/2020given the patient's comorbid morbid conditions with poor wound healing.  Osteomyelitis, left foot -S/p L BKA amputation on 10/08/2018.Patient's pain is still present, but has improved.  - PT recommending home health PT with 24 hour assistance. Case  management consulted for assistance with arranging home health. - Patient was febrile to 101 yesterday prior to receiving blood transfusion. Post-transfusion, she had a low grade fever to 100.5. Ddx includes post-op inflammation, atelectasis/pneumonia, surgical site infection, and DVT. She has been using her IS and getting out of bed and she is on heparin ppx. No obvious sign of infection at the surgical site.    Plan - Ortho following, appreciate recs - Pain control: POoxycodone 10 mg every 4 hours as needed - Continue gabapentin 300mg  TID - Continue Robaxin q6hrs prn for muscle spasms - Continue to monitor fever curve. If fevers again, will obtain blood cultures.  - Outpatient f/u with ortho 1 week post-op. Continue wound vac until then.  Acute blood loss anemia  - Patient has history of anemia 2/2 CKD. Baseline hemoglobin 8-9. Decreased to 5.7 yesterday, requiring 2u RBCs. Drop in hemoglobin likely combination of preexisting anemia 2/2 CKD as well as surgical bleeding. Hb appropriately increased to 7.7 post-transfusion. Hb is 7.1 this am.  Plan - Trend Hb - Aranesp and IV iron per nephro  CKD stage V -Creatinine 6.71 on admission. Cr 6.6 today. This is about baseline for her. K increased at 5.5. - VVS consulted for HD access planning. She will likely need LUE graft, however she needs to be afebrile with no leukocytosis for 48hrs prior to placing the graft.  Plan - Consulted nephrology, appreciate recs - Vascular consulted, appreciate recs - Lokelma 10mg  daily - Phoslo, aranesp, sodium bicarb per nephro  - trend BMP   Hypertension - Blood pressures elevated to 145/75 this  am Plan -Continue amlodipine 10 mg daily  - Increase labetalol to200TID  Diabetes mellitus - Blood sugars within goal Plan -Continue Lantus 15 units nightly -SSI  Dispo: Anticipated discharge in approximately 2-3 days.  Kathy Frank, [name], MD 10/11/2018, 6:27 AM Pager: [phone]

## 2018-10-11 NOTE — Progress Notes (Signed)
Tm 100 yesterday maybe transfusion related Left upper arm AV graft by Dr Carlis Abbott Friday if remains afebrile  Ruta Hinds, MD [facility]: [phone] Pager: [phone]

## 2018-10-11 NOTE — Progress Notes (Signed)
PT Cancellation Note  Patient Details Name: Kathy Frank MRN: [id number] DOB: Feb 25, 1976   Cancelled Treatment:    Reason Eval/Treat Not Completed: Patient at procedure or test/unavailable   Will continue to follow;  If we initiate HD this admission, it is worth considering a CIR stay to maximize independence and safety with mobility and activity tolerance, for safe dc home and prep for Prosthesis;   Roney Marion, [place] Pager [phone] Office [phone]    Colletta Maryland 10/11/2018, 12:21 PM

## 2018-10-11 NOTE — Progress Notes (Signed)
Patient remains off the unit

## 2018-10-11 NOTE — Progress Notes (Signed)
Physical Therapy Treatment Patient Details Name: Kathy Frank MRN: [id number] DOB: 1975-12-07 Today's Date: 10/11/2018    History of Present Illness Kathy Frank is a 43 y.o.-year-old female who has dehiscence of her transmetatarsal amputation on the left.  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) , Normocytic anemia, diabetes mellitus     PT Comments    Continuing work on functional mobility and activity tolerance;  Performed full complement of amputee therex as noted below -- EXCELLENT participation; At one point, Kathy Frank indicated this change from the amputation is "getting to me"; I plan to reach out to [place], PT, to see about connecting Kathy Frank with the Amputee Support Group.  Follow Up Recommendations  Outpatient PT;Supervision/Assistance - 24 hour(Saving PT visits for prosthesis trng)     Equipment Recommendations  3in1 (PT)    Recommendations for Other Services OT consult(ordered per protocol)     Precautions / Restrictions Precautions Precautions: Fall Precaution Comments: Fall risk greatly reduced with use of RW Restrictions LLE Weight Bearing: Non weight bearing    Mobility  Bed Mobility Overal bed mobility: Modified Independent Bed Mobility: Rolling;Supine to Sit;Sit to Supine Rolling: Modified independent (Device/Increase time)   Supine to sit: Modified independent (Device/Increase time) Sit to supine: Modified independent (Device/Increase time)   General bed mobility comments: Managing well in teh bed for supine, sidelying, and prone therex  Transfers Overall transfer level: Needs assistance Equipment used: Rolling walker (2  wheeled) Transfers: Sit to/from Stand Sit to Stand: Min guard         General transfer comment: minguard for safety; cues for hand placement; good rise  Ambulation/Gait                 Stairs             Wheelchair Mobility    Modified Rankin (Stroke Patients Only)       Balance             Standing balance-Leahy Scale: Poor                              Cognition Arousal/Alertness: Awake/alert Behavior During Therapy: WFL for tasks assessed/performed Overall Cognitive Status: Within Functional Limits for tasks assessed                                        Exercises Amputee Exercises Quad Sets: AROM;Left;10 reps Towel Squeeze: AROM;Both;10 reps Hip Extension: AROM;Right;Left;10 reps;Prone(alternating) Hip ABduction/ADduction: AROM;Left;10 reps;Sidelying Knee Flexion: AROM;Left;10 reps Knee Extension: AROM;Left;10 reps;Seated Straight Leg Raises: AROM;Left;10 reps;Supine Other Exercises Other Exercises: Bolstered bridging x10  Other Exercises: Hamstring stretch with 15 sec hold x3 Other Exercises: isometric hip abduction with strap around knees x10 Other Exercises: standing press-ups in RW at bedside x 5 Other Exercises: Pronelying bil hip and upper trunk extension x5    General Comments General comments (skin integrity, edema, etc.): Performed desensitization activities in sitting      Pertinent Vitals/Pain Pain Assessment: Faces Faces Pain Scale: Hurts a little bit Pain Location: Residucal limb Pain Descriptors / Indicators: Discomfort Pain  Intervention(s): Monitored during session    Home Living                      Prior Function            PT Goals (current goals can now be found in the care plan section) Acute Rehab PT Goals Patient Stated Goal: get to prosthesis PT Goal Formulation: With patient Time For Goal Achievement: 10/23/18 Potential to Achieve Goals: Good Progress towards PT  goals: Progressing toward goals    Frequency    Min 3X/week      PT Plan Current plan remains appropriate    Co-evaluation              AM-PAC PT "6 Clicks" Mobility   Outcome Measure  Help needed turning from your back to your side while in a flat bed without using bedrails?: None Help needed moving from lying on your back to sitting on the side of a flat bed without using bedrails?: None Help needed moving to and from a bed to a chair (including a wheelchair)?: A Little Help needed standing up from a chair using your arms (e.g., wheelchair or bedside chair)?: A Little Help needed to walk in hospital room?: A Little Help needed climbing 3-5 steps with a railing? : A Lot 6 Click Score: 19    End of Session Equipment Utilized During Treatment: Gait belt(for isometric hip abduction) Activity Tolerance: Patient tolerated treatment well Patient left: in bed;with call bell/phone within reach Nurse Communication: Mobility status PT Visit Diagnosis: Unsteadiness on feet (R26.81);Other abnormalities of gait and mobility (R26.89);Pain Pain - Right/Left: Left Pain - part of body: Leg     Time: 4628-6381 PT Time Calculation (min) (ACUTE ONLY): 31 min  Charges:  $Therapeutic Exercise: 23-37 mins                     Roney Marion, PT  Acute Rehabilitation Services Pager [phone] Office [phone]    Colletta Maryland 10/11/2018, 5:07 PM

## 2018-10-11 NOTE — Progress Notes (Signed)
Bilateral upper extremity vein mapping and bilateral upper extremity arterial duplex has been completed. Preliminary results can be found in CV Proc through chart review.   10/11/18 12:22 PM Carlos Levering RVT

## 2018-10-12 LAB — RENAL FUNCTION PANEL
Albumin: 2.6 g/dL — ABNORMAL LOW (ref 3.5–5.0)
Anion gap: 8 (ref 5–15)
BUN: 40 mg/dL — ABNORMAL HIGH (ref 6–20)
CO2: 19 mmol/L — ABNORMAL LOW (ref 22–32)
Calcium: 7.9 mg/dL — ABNORMAL LOW (ref 8.9–10.3)
Chloride: 107 mmol/L (ref 98–111)
Creatinine, Ser: 6.81 mg/dL — ABNORMAL HIGH (ref 0.44–1.00)
GFR calc Af Amer: 8 mL/min — ABNORMAL LOW (ref >60)
GFR calc non Af Amer: 7 mL/min — ABNORMAL LOW (ref >60)
Glucose, Bld: 78 mg/dL (ref 70–99)
Phosphorus: 5.5 mg/dL — ABNORMAL HIGH (ref 2.5–4.6)
Potassium: 5.4 mmol/L — ABNORMAL HIGH (ref 3.5–5.1)
Sodium: 134 mmol/L — ABNORMAL LOW (ref 135–145)

## 2018-10-12 LAB — CBC
HCT: 23.3 % — ABNORMAL LOW (ref 36.0–46.0)
Hemoglobin: 7.5 g/dL — ABNORMAL LOW (ref 12.0–15.0)
MCH: 28.8 pg (ref 26.0–34.0)
MCHC: 32.2 g/dL (ref 30.0–36.0)
MCV: 89.6 fL (ref 80.0–100.0)
Platelets: 247 10*3/uL (ref 150–400)
RBC: 2.6 MIL/uL — ABNORMAL LOW (ref 3.87–5.11)
RDW: 16.4 % — ABNORMAL HIGH (ref 11.5–15.5)
WBC: 6.8 10*3/uL (ref 4.0–10.5)
nRBC: 0 % (ref 0.0–0.2)

## 2018-10-12 LAB — CULTURE, BLOOD (ROUTINE X 2)
Culture: NO GROWTH
Culture: NO GROWTH
Special Requests: ADEQUATE
Special Requests: ADEQUATE

## 2018-10-12 LAB — GLUCOSE, CAPILLARY
Glucose-Capillary: 88 mg/dL (ref 70–99)
Glucose-Capillary: 149 mg/dL — ABNORMAL HIGH (ref 70–99)
Glucose-Capillary: 134 mg/dL — ABNORMAL HIGH (ref 70–99)
Glucose-Capillary: 103 mg/dL — ABNORMAL HIGH (ref 70–99)

## 2018-10-12 MED ORDER — INSULIN GLARGINE 100 UNIT/ML ~~LOC~~ SOLN
10.0000 [IU] | Freq: Every day | SUBCUTANEOUS | Status: DC
  Filled 2018-10-12: qty 0.1

## 2018-10-12 MED ORDER — CALCITRIOL 0.25 MCG PO CAPS
0.2500 ug | ORAL_CAPSULE | Freq: Every day | ORAL | Status: DC
  Administered 2018-10-12 – 2018-10-16 (×5): 0.25 ug via ORAL
  Filled 2018-10-12 (×5): qty 1

## 2018-10-12 NOTE — Progress Notes (Signed)
Subjective: No overnight events. She has remained afebrile for 24 hours. Ms. Dowd is feeling much better this morning. She has the most pain at night which is well controlled with pain medication and muscle relaxer. No nausea or vomiting. Discussed plan for continuing to work with nephrology and vascular for dialysis access. All questions and concerns were addressed.   Objective:  Vital signs in last 24 hours: Vitals:   10/11/18 1311 10/11/18 1700 10/11/18 1931 10/12/18 0449  BP: 132/71 132/71 124/66 133/75  Pulse: 82 82 87 84  Resp: 18  18 20   Temp: 98.5 F (36.9 C)  98.7 F (37.1 C) 98.5 F (36.9 C)  TempSrc: Oral  Oral Oral  SpO2: 100%  99% 100%  Weight:      Height:       General: awake, alert, sitting up in bed in NAD CV: RRR; no murmurs Pulm: normal work of breathing; lungs CTA bilaterally Ext: left BKA, no edema  Assessment/Plan:  Principal Problem:   Osteomyelitis (HCC) Active Problems:   Diabetes mellitus type 2 in obese (HCC)   Essential hypertension   Human immunodeficiency virus (HIV) disease (HCC)   CKD (chronic kidney disease), stage V (HCC)   Wound infection s/p L transmetatarsal amputation   Dehiscence of amputation stump (HCC)   Moderate protein-calorie malnutrition (HCC)  Kathy Frank is a 43 year old female with past medical history of insulin-dependent diabetes, HTN, CKD stage V, and HIV with 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 She presented withpain and drainage of the left foot. She underwenttranstibialamputation on 2/29/2020given the patient's comorbid morbid conditions with poor wound healing.  Osteomyelitis, left foot -S/pL BKAamputation on 10/08/2018.Patient's pain is still present, but has improved.  - PT recommending home health PT with 24 hour assistance. Case management consulted for assistance with arranging home health. - Afebrile for 24 hrs. No leukocytosis. No signs of DVT or  infection. Plan - Ortho following, appreciate recs - Pain control: POoxycodone10mg  every 4 hours as needed -Continuegabapentin 300mg  TID - Continue Robaxin q6hrs prn for muscle spasms - Continue to monitor fever curve. If fevers again, will obtain blood cultures.  - Outpatient f/u with ortho 1 week post-op. Continue wound vac until then.  Acute blood loss anemia  - Patient has history of anemia 2/2 CKD. Baseline hemoglobin 8-9. Decreased to 5.7 on post-op day 2, requiring 2u RBCs. Drop in hemoglobin likely combination of preexisting anemia 2/2 CKD as well as surgical bleeding. Hb appropriately increased to 7.7 post-transfusion. Hb stable at 7.5 today. Plan - Trend Hb - Aranesp and IV iron per nephro  CKD stage V -Creatinine 6.71 on admission. Cr 6.8 today. This is about baseline for her. K continues to be increased at 5.4 today. - VVS consulted for HD access planning. She is scheduled for AVF placement, however she needs to be afebrile with no leukocytosis for 48hrs prior to placing the graft.  Plan - Consulted nephrology, appreciate recs - Vascular consulted, appreciate recs. Plan for LUE AV graft placement on Friday.  - Lokelma 10g daily - Phoslo, aranesp, sodium bicarb per nephro - trend BMP   Hypertension - Blood pressure well controlled at 133/75 this morning. Plan -Continue amlodipine 10 mg daily and labetalol to200TID  Diabetes mellitus - Am blood sugars have been low at 81 and 78 the past two mornings.  Plan -Decrease to lantus 10u qhs -SSI  Dispo: Anticipated discharge after AVF placement Friday  Corinne Ports, MD 10/12/2018, 6:22 AM Pager: [phone]

## 2018-10-12 NOTE — Plan of Care (Signed)
  Problem: Safety: Goal: Ability to remain free from injury will improve Outcome: Progressing   

## 2018-10-12 NOTE — Progress Notes (Signed)
OT EVAL   10/12/18 1100  OT Visit Information  Last OT Received On 10/12/18  Assistance Needed +1  History of Present Illness Kathy Frank is a 43 y.o.-year-old female who has dehiscence of her transmetatarsal amputation on the left.  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) , Normocytic anemia, diabetes mellitus   Precautions  Precautions Fall  Precaution Comments Fall risk greatly reduced with use of RW; residual limb protector  Pain Assessment  Pain Assessment Faces  Faces Pain Scale 2  Pain Location Residucal limb  Pain Descriptors / Indicators Discomfort  Pain Intervention(s) Repositioned  Cognition  Arousal/Alertness Awake/alert  Behavior During Therapy WFL for tasks assessed/performed  Overall Cognitive Status Within Functional Limits for tasks assessed  Upper Extremity Assessment  Upper Extremity Assessment Overall WFL for tasks assessed  Lower Extremity Assessment  LLE Deficits / Details S/p BKA;   ADL  Overall ADL's  Needs assistance/impaired  Eating/Feeding Independent  Grooming Wash/dry hands;Wash/dry face;Independent;Sitting  Upper Body Bathing Set up;Sitting  Lower Body Bathing Set up;Sit to/from stand  Lower Body Dressing Set up;Sit to/from stand  Lower Body Dressing Details (indicate cue type and reason) pt able to sit on eob and pull up shrinker to proper fit. pt also (A) with don of the limb Presenter, broadcasting Details (indicate cue type and reason) simulated bed to w/c   General ADL Comments Pts birthday so pt was allowed in w/c with daughters in the 5n lobby to get out of the room  Bed  Mobility  Overal bed mobility Modified Independent  Balance  Overall balance assessment Needs assistance  Standing balance-Leahy Scale Poor  Restrictions  Weight Bearing Restrictions Yes  LLE Weight Bearing NWB  Transfers  Overall transfer level Needs assistance  Equipment used Rolling walker (2 wheeled)  Transfers Sit to/from Stand  Sit to Stand Min guard  General transfer comment minguard for safety; cues for hand placement; good rise  Exercises  Exercises Amputee  Amputee Exercises  Quad Sets AROM;Left;10 reps  Towel Squeeze AROM;Both;10 reps  Hip Extension AROM;Right;Left;10 reps;Prone (alternating)  Hip ABduction/ADduction AROM;Left;10 reps;Sidelying  Knee Flexion AROM;Left;10 reps  Knee Extension AROM;Left;10 reps;Seated  Straight Leg Raises AROM;Left;10 reps;Supine  Other Exercises  Other Exercises Bolstered bridging x10   Other Exercises Hamstring stretch with 15 sec hold x3  Other Exercises isometric hip abduction with strap around knees x10  Other Exercises standing press-ups in RW at bedside x 5  Other Exercises Pronelying bil hip and upper trunk extension x5  OT - End of Session  Equipment Utilized During Treatment Gait belt;Rolling walker  Activity Tolerance Patient tolerated treatment well  Patient left Other (comment) (w/c with family)  Nurse Communication Mobility status;Precautions  OT Assessment/Plan  OT Plan Discharge plan remains appropriate  OT Visit Diagnosis Unsteadiness on feet (R26.81)  OT Frequency (ACUTE ONLY) Min 2X/week  Follow Up Recommendations Outpatient OT  OT Equipment None recommended by OT  AM-PAC OT "6 Clicks" Daily Activity Outcome Measure (Version 2)  Help from another person eating meals? 4  Help from another person taking care of personal grooming? 3  Help from another person toileting, which includes using toliet, bedpan, or urinal?  3  Help from another person bathing (including washing, rinsing, drying)? 3  Help from another  person to put on and taking off regular upper body clothing? 3  Help from another person to put on and taking off regular lower body clothing? 3  6 Click Score 19  Acute Rehab OT Goals  Patient Stated Goal get to prosthesis  OT Goal Formulation With patient  Time For Goal Achievement 10/26/18  Potential to Achieve Goals Good  OT Time Calculation  OT Start Time (ACUTE ONLY) 0955  OT Stop Time (ACUTE ONLY) 1011  OT Time Calculation (min) 16 min  OT General Charges  $OT Visit 1 Visit  OT Evaluation  $OT Eval Moderate Complexity 1 Mod   Jeri Modena, OTR/L  Acute Rehabilitation Services Pager: [phone] Office: [phone] .

## 2018-10-12 NOTE — Progress Notes (Signed)
Physical Therapy Treatment Patient Details Name: Kathy Frank MRN: [id number] DOB: 1976/07/04 Today's Date: 10/12/2018    History of Present Illness Ms. Bellanger is a 43 y.o.-year-old female who has dehiscence of her transmetatarsal amputation on the left.  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 Pt is progressing very well toward PT goals and tolerated increased gait distance this session. Plan for stair training next session.    Follow Up Recommendations  Outpatient PT;Supervision/Assistance - 24 hour;Other (comment)(saving PT visits for prosthesis trng)     Equipment Recommendations  3in1 (PT)    Recommendations for Other Services       Precautions / Restrictions Precautions Precautions: Fall Precaution Comments: Fall risk greatly reduced with use of RW; residual limb protector Restrictions Weight Bearing Restrictions: Yes LLE Weight Bearing: Non weight bearing    Mobility  Bed Mobility Overal bed mobility: Modified Independent Bed Mobility: Rolling;Supine to Sit;Sit to Supine Rolling: Modified independent (Device/Increase time)   Supine to sit: Modified independent (Device/Increase time) Sit to supine: Modified independent (Device/Increase time)   General bed mobility comments: pt sitting up in bed upon arrival   Transfers Overall transfer level: Needs assistance Equipment used: Rolling walker (2 wheeled) Transfers: Sit to/from Stand Sit to Stand: Min guard         General transfer comment: minguard for  safety  Ambulation/Gait Ambulation/Gait assistance: Min guard Gait Distance (Feet): (150 ft total with seated break due to UE fatigue) Assistive device: Rolling walker (2 wheeled) Gait Pattern/deviations: Step-to pattern Gait velocity: decreased   General Gait Details: cues for maintaining safe proximity to RW; one LOB and then rest break taken as pt c/o bilat UE fatigue   Stairs             Wheelchair Mobility    Modified Rankin (Stroke Patients Only)       Balance Overall balance assessment: Needs assistance   Sitting balance-Leahy Scale: Good       Standing balance-Leahy Scale: Poor                              Cognition Arousal/Alertness: Awake/alert Behavior During Therapy: WFL for tasks assessed/performed Overall Cognitive Status: Within Functional Limits for tasks assessed                                        Exercises Amputee Exercises Quad Sets: AROM;Left;10 reps Towel Squeeze: AROM;Both;10 reps Hip Extension: AROM;Right;Left;10 reps;Prone(alternating) Hip ABduction/ADduction: AROM;Left;10 reps;Sidelying Knee Flexion: AROM;Left;10 reps Knee Extension: AROM;Left;10 reps;Seated Straight Leg Raises: AROM;Left;10 reps;Supine Other Exercises Other Exercises: Bolstered bridging x10  Other Exercises: Hamstring stretch with 15 sec hold x3 Other Exercises: isometric hip abduction with strap around knees x10 Other Exercises: standing press-ups in RW at bedside x 5 Other Exercises: Pronelying bil hip and upper trunk extension x5    General Comments        Pertinent Vitals/Pain Pain Assessment: Faces Faces Pain Scale: Hurts a little bit Pain  Location: Residucal limb Pain Descriptors / Indicators: Aching Pain Intervention(s): Limited activity within patient's tolerance;Monitored during session;Repositioned    Home Living Family/patient expects to be discharged to:: Private residence Living Arrangements:  Children Available Help at Discharge: Family;Available 24 hours/day Type of Home: House Home Access: Stairs to enter(may go to neice's house with no steps to enter) Entrance Stairs-Rails: Can reach both Home Layout: One level Home Equipment: Environmental consultant - 2 wheels Additional Comments: Pt's mother lives next door and has shower chair pt will use    Prior Function Level of Independence: Independent with assistive device(s)      Comments: Pt reports she has been managing with RW and crutches since last admission in Dec   PT Goals (current goals can now be found in the care plan section) Acute Rehab PT Goals Patient Stated Goal: get to prosthesis Progress towards PT goals: Progressing toward goals    Frequency    Min 3X/week      PT Plan Current plan remains appropriate    Co-evaluation              AM-PAC PT "6 Clicks" Mobility   Outcome Measure  Help needed turning from your back to your side while in a flat bed without using bedrails?: None Help needed moving from lying on your back to sitting on the side of a flat bed without using bedrails?: None Help needed moving to and from a bed to a chair (including a wheelchair)?: A Little Help needed standing up from a chair using your arms (e.g., wheelchair or bedside chair)?: A Little Help needed to walk in hospital room?: A Little Help needed climbing 3-5 steps with a railing? : A Lot 6 Click Score: 19    End of Session Equipment Utilized During Treatment: Gait belt Activity Tolerance: Patient tolerated treatment well Patient left: in bed;with call bell/phone within reach;with family/visitor present Nurse Communication: Mobility status PT Visit Diagnosis: Unsteadiness on feet (R26.81);Other abnormalities of gait and mobility (R26.89);Pain Pain - Right/Left: Left Pain - part of body: Leg     Time: 1537-9432 PT Time Calculation (min) (ACUTE ONLY): 21 min  Charges:  $Gait Training: 8-22 mins                      Earney Navy, PTA Acute Rehabilitation Services Pager: [phone] Office: [phone]     Darliss Cheney 10/12/2018, 12:10 PM

## 2018-10-12 NOTE — Progress Notes (Signed)
[facility] NEPHROLOGY PROGRESS NOTE  Assessment/ Plan: Pt is a 43 y.o. yo female with poorly controlled diabetes, hypertension, HIV CD4 count 450, CKD stage V followed by Dr. 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  #CKD stage V due to poorly controlled diabetes and hypertension: Serum creatinine level 6.8 which is not far from her baseline.  She has no uremic symptoms and volume status looks acceptable.  Continue sodium bicarbonate and Lokelma.  AVS plan for AV fistula creation on Friday.  I have discussed with the patient regarding low potassium diet, consult dietitian.  No need for urgent dialysis at this time.  I have discussed with the patient that if there is no improvement in electrolytes or renal function she may need to start dialysis soon.  #Hyperkalemia: Recommend low potassium diet.  Continue Lokelma every day.  Monitor labs  #Anemia of CKD: Acute worsening likely due to acute blood loss.  Received blood transfusion.  Iron saturation 6%, continue IV iron and continue ESA.  #Secondary hyperparathyroidism: Continue PhosLo, monitor phosphorus and calcium level.  PTH level 252, start calcitriol.  #PAD status post left BKA: Per Ortho.  #HIV on medication, CD4 count 150 in May 2019  #Hypertension: Continue current antihypertensive medication.  Subjective: Seen and examined at bedside.  Doing well.  Denies headache, dizziness, nausea, vomiting, chest pain, shortness of breath, dysgeusia Objective Vital signs in last 24 hours: Vitals:   10/11/18 1700 10/11/18 1931 10/12/18 0449 10/12/18 0831  BP: 132/71 124/66 133/75 135/70  Pulse: 82 87 84 80  Resp:  18 20 16   Temp:  98.7 F (37.1 C) 98.5 F (36.9 C) 98.4 F (36.9 C)  TempSrc:  Oral Oral Oral  SpO2:  99% 100% 100%  Weight:      Height:       Weight change:   Intake/Output Summary (Last 24 hours) at 10/12/2018  1329 Last data filed at 10/12/2018 1100 Gross per 24 hour  Intake 1320 ml  Output 2 ml  Net 1318 ml       Labs: Basic Metabolic Panel: Recent Labs  Lab 10/10/18 0321 10/11/18 0748 10/12/18 0513  NA 135 136 134*  K 5.3* 5.5* 5.4*  CL 110 110 107  CO2 14* 18* 19*  GLUCOSE 125* 89 78  BUN 39* 39* 40*  CREATININE 6.50* 6.63* 6.81*  CALCIUM 7.8* 7.7* 7.9*  PHOS 5.7* 5.7* 5.5*   Liver Function Tests: Recent Labs  Lab 10/06/18 2222 10/10/18 0321 10/11/18 0748 10/12/18 0513  AST 11*  --   --   --   ALT 8  --   --   --   ALKPHOS 101  --   --   --   BILITOT 0.1*  --   --   --   PROT 7.5  --   --   --   ALBUMIN 3.2* 2.6* 2.5* 2.6*   No results for input(s): LIPASE, AMYLASE in the last 168 hours. No results for input(s): AMMONIA in the last 168 hours. CBC: Recent Labs  Lab 10/06/18 2222  10/08/18 0312  10/10/18 0321 10/11/18 0030 10/11/18 0748 10/12/18 0513  WBC 7.6  --  6.2  --  7.6  --  7.8 6.8  NEUTROABS 4.8  --   --   --   --   --   --   --   HGB 8.0*   < > 7.3*  --  5.7* 7.7* 7.1* 7.5*  HCT 26.7*   < > 23.6*   < > 18.1* 23.9* 22.2* 23.3*  MCV 94.3  --  92.5  --  92.3  --  87.7 89.6  PLT 278  --  260  --  196  --  194 247   < > = values in this interval not displayed.   Cardiac Enzymes: No results for input(s): CKTOTAL, CKMB, CKMBINDEX, TROPONINI in the last 168 hours. CBG: Recent Labs  Lab 10/11/18 1648 10/11/18 1749 10/11/18 2207 10/12/18 0828 10/12/18 1127  GLUCAP 61* 143* 206* 88 149*    Iron Studies:  Recent Labs    10/09/18 1405  IRON 13*  TIBC 227*  FERRITIN 92   Studies/Results: Vas [place] Upper Extremity Arterial Duplex  Result Date: 10/11/2018 UPPER EXTREMITY DUPLEX STUDY Indications: HD access planning.  Performing Technologist: Oliver Hum RVT  Examination Guidelines: A complete evaluation includes B-mode imaging, spectral Doppler, color Doppler, and power Doppler as needed of all accessible portions of each vessel. Bilateral  testing is considered an integral part of a complete examination. Limited examinations for reoccurring indications may be performed as noted.  Right Doppler Findings: +----------+----------+---------+------+--------+ Site      PSV (cm/s)Waveform PlaqueComments +----------+----------+---------+------+--------+ [id number]       triphasic               +----------+----------+---------+------+--------+ Brachial  120       triphasic               +----------+----------+---------+------+--------+ Radial    93        triphasic               +----------+----------+---------+------+--------+ Ulnar     100       triphasic               +----------+----------+---------+------+--------+ Left Doppler Findings: +----------+----------+---------+------+--------+ Site      PSV (cm/s)Waveform PlaqueComments +----------+----------+---------+------+--------+ [id number]       triphasic               +----------+----------+---------+------+--------+ Brachial  128       triphasic               +----------+----------+---------+------+--------+ Radial    124       triphasic               +----------+----------+---------+------+--------+ Ulnar     92        triphasic               +----------+----------+---------+------+--------+  Summary:  Right: <50% stenosis visualized in the right upper extremity. Left: <50% stenosis visualized in the left upper extremity. *See table(s) above for measurements and observations. Electronically signed by Deitra Mayo MD on 10/11/2018 at 3:55:53 PM.    Final    Vas [place] Upper Ext Vein Mapping (pre-op Avf)  Result Date: 10/11/2018 UPPER EXTREMITY VEIN MAPPING  Indications: Pre-access. Performing Technologist: Oliver Hum RVT  Examination Guidelines: A complete evaluation includes B-mode imaging, spectral Doppler, color Doppler, and power Doppler as needed of all accessible portions of each vessel. Bilateral testing is considered an  integral part of a complete examination. Limited examinations for reoccurring indications may be performed as noted. +-----------------+-------------+----------+----------------+ Right Cephalic   Diameter (cm)Depth (cm)    Findings     +-----------------+-------------+----------+----------------+ Shoulder             0.25        0.65                    +-----------------+-------------+----------+----------------+  Prox upper arm       0.27        0.43      branching     +-----------------+-------------+----------+----------------+ Mid upper arm        0.28        0.34                    +-----------------+-------------+----------+----------------+ Dist upper arm       0.27        0.42                    +-----------------+-------------+----------+----------------+ Antecubital fossa    0.32        0.38      branching     +-----------------+-------------+----------+----------------+ Prox forearm         0.29        0.13   Partial thrombus +-----------------+-------------+----------+----------------+ Mid forearm          0.15        0.31                    +-----------------+-------------+----------+----------------+ Dist forearm         0.07        0.18                    +-----------------+-------------+----------+----------------+ +-----------------+-------------+----------+---------+ Right Basilic    Diameter (cm)Depth (cm)Findings  +-----------------+-------------+----------+---------+ Shoulder             0.24        1.08             +-----------------+-------------+----------+---------+ Mid upper arm        0.21        1.06             +-----------------+-------------+----------+---------+ Dist upper arm       0.33        0.51   branching +-----------------+-------------+----------+---------+ Antecubital fossa    0.14        0.34             +-----------------+-------------+----------+---------+ Prox forearm         0.16        0.17              +-----------------+-------------+----------+---------+ Mid forearm          0.15        0.16             +-----------------+-------------+----------+---------+ Distal forearm       0.17        0.13             +-----------------+-------------+----------+---------+ +-----------------+-------------+----------+---------+ Left Cephalic    Diameter (cm)Depth (cm)Findings  +-----------------+-------------+----------+---------+ Shoulder             0.38        0.47             +-----------------+-------------+----------+---------+ Prox upper arm       0.27        0.42   branching +-----------------+-------------+----------+---------+ Mid upper arm        0.24        0.52   branching +-----------------+-------------+----------+---------+ Dist upper arm       0.17        0.39             +-----------------+-------------+----------+---------+ Antecubital fossa    0.11        0.32             +-----------------+-------------+----------+---------+  Prox forearm         0.11        0.19             +-----------------+-------------+----------+---------+ Mid forearm          0.12        0.15             +-----------------+-------------+----------+---------+ Dist forearm         0.12        0.18             +-----------------+-------------+----------+---------+ +-----------------+-------------+----------+---------+ Left Basilic     Diameter (cm)Depth (cm)Findings  +-----------------+-------------+----------+---------+ Shoulder             0.42        1.00             +-----------------+-------------+----------+---------+ Mid upper arm        0.40        0.82             +-----------------+-------------+----------+---------+ Dist upper arm       0.37        0.62   branching +-----------------+-------------+----------+---------+ Antecubital fossa    0.32        0.34             +-----------------+-------------+----------+---------+ Prox forearm          0.25        0.17   branching +-----------------+-------------+----------+---------+ Mid forearm          0.19        0.20             +-----------------+-------------+----------+---------+ Distal forearm       0.16        0.16             +-----------------+-------------+----------+---------+ *See table(s) above for measurements and observations.  Diagnosing physician: Deitra Mayo MD Electronically signed by Deitra Mayo MD on 10/11/2018 at 3:56:15 PM.    Final     Medications: Infusions: . sodium chloride    . sodium chloride 10 mL/hr at 10/09/18 1400  . ferric gluconate (FERRLECIT/NULECIT) IV 125 mg (10/12/18 1000)  . methocarbamol (ROBAXIN) IV Stopped (10/09/18 3716)    Scheduled Medications: . amLODipine  10 mg Oral Daily  . calcium acetate  1,334 mg Oral TID WC  . citalopram  20 mg Oral Daily  . darbepoetin (ARANESP) injection - NON-DIALYSIS  60 mcg Subcutaneous Q Tue-1800  . docusate sodium  100 mg Oral BID  . dolutegravir  50 mg Oral Q breakfast   And  . rilpivirine  25 mg Oral Q breakfast  . gabapentin  300 mg Oral Daily  . heparin  5,000 Units Subcutaneous Q8H  .  HYDROmorphone (DILAUDID) injection  0.5 mg Intravenous Once  . insulin aspart  0-15 Units Subcutaneous TID WC  . insulin glargine  10 Units Subcutaneous Q2200  . labetalol  200 mg Oral TID  . sodium bicarbonate  1,300 mg Oral TID  . sodium zirconium cyclosilicate  10 g Oral Daily    have reviewed scheduled and prn medications.  Physical Exam: General: Able to lie flat, not in distress Heart:RRR, s1s2 nl, rubs Lungs: Clear bilateral, no crackles or wheezing Abdomen:soft, Non-tender, non-distended Extremities: No edema Neurology: Alert awake, no asterixis  Kellee Sittner Prasad Kristyn Obyrne 10/12/2018,1:29 PM  LOS: 5 days

## 2018-10-13 LAB — CBC
HCT: 21.8 % — ABNORMAL LOW (ref 36.0–46.0)
Hemoglobin: 7.1 g/dL — ABNORMAL LOW (ref 12.0–15.0)
MCH: 29 pg (ref 26.0–34.0)
MCHC: 32.6 g/dL (ref 30.0–36.0)
MCV: 89 fL (ref 80.0–100.0)
Platelets: 258 10*3/uL (ref 150–400)
RBC: 2.45 MIL/uL — ABNORMAL LOW (ref 3.87–5.11)
RDW: 15.8 % — ABNORMAL HIGH (ref 11.5–15.5)
WBC: 6 10*3/uL (ref 4.0–10.5)
nRBC: 0 % (ref 0.0–0.2)

## 2018-10-13 LAB — RENAL FUNCTION PANEL
Albumin: 2.5 g/dL — ABNORMAL LOW (ref 3.5–5.0)
Anion gap: 11 (ref 5–15)
BUN: 45 mg/dL — ABNORMAL HIGH (ref 6–20)
CO2: 22 mmol/L (ref 22–32)
Calcium: 7.9 mg/dL — ABNORMAL LOW (ref 8.9–10.3)
Chloride: 102 mmol/L (ref 98–111)
Creatinine, Ser: 7.12 mg/dL — ABNORMAL HIGH (ref 0.44–1.00)
GFR calc Af Amer: 7 mL/min — ABNORMAL LOW (ref >60)
GFR calc non Af Amer: 6 mL/min — ABNORMAL LOW (ref >60)
Glucose, Bld: 93 mg/dL (ref 70–99)
Phosphorus: 6.2 mg/dL — ABNORMAL HIGH (ref 2.5–4.6)
Potassium: 5.3 mmol/L — ABNORMAL HIGH (ref 3.5–5.1)
Sodium: 135 mmol/L (ref 135–145)

## 2018-10-13 LAB — GLUCOSE, CAPILLARY
Glucose-Capillary: 101 mg/dL — ABNORMAL HIGH (ref 70–99)
Glucose-Capillary: 131 mg/dL — ABNORMAL HIGH (ref 70–99)
Glucose-Capillary: 50 mg/dL — ABNORMAL LOW (ref 70–99)
Glucose-Capillary: 98 mg/dL (ref 70–99)
Glucose-Capillary: 151 mg/dL — ABNORMAL HIGH (ref 70–99)

## 2018-10-13 MED ORDER — HYDROMORPHONE HCL 1 MG/ML IJ SOLN
0.5000 mg | Freq: Once | INTRAMUSCULAR | Status: AC
  Administered 2018-10-13: 0.5 mg via INTRAVENOUS
  Filled 2018-10-13: qty 1

## 2018-10-13 MED ORDER — SODIUM ZIRCONIUM CYCLOSILICATE 10 G PO PACK
10.0000 g | PACK | Freq: Every day | ORAL | Status: DC
  Administered 2018-10-14: 10 g via ORAL
  Filled 2018-10-13: qty 1

## 2018-10-13 MED ORDER — INSULIN GLARGINE 100 UNIT/ML ~~LOC~~ SOLN
5.0000 [IU] | Freq: Every day | SUBCUTANEOUS | Status: DC
  Administered 2018-10-14 – 2018-10-15 (×2): 5 [IU] via SUBCUTANEOUS
  Filled 2018-10-13 (×4): qty 0.05

## 2018-10-13 NOTE — Progress Notes (Signed)
Left upper arm graft tomorrow if afebrile  Consent  NPO  Ruta Hinds, MD [facility] Office: [phone] Pager: [phone]

## 2018-10-13 NOTE — Anesthesia Preprocedure Evaluation (Addendum)
Anesthesia Evaluation  Patient identified by MRN, date of birth, ID band Patient awake    Reviewed: Allergy & Precautions, NPO status , Patient's Chart, lab work & pertinent test results  Airway Mallampati: II  TM Distance: >3 FB Neck ROM: Full    Dental  (+) Poor Dentition, Partial Upper, Partial Lower   Pulmonary former smoker,    Pulmonary exam normal breath sounds clear to auscultation       Cardiovascular hypertension, Pt. on medications Normal cardiovascular exam Rhythm:Regular Rate:Normal     Neuro/Psych PSYCHIATRIC DISORDERS Depression negative neurological ROS     GI/Hepatic PUD, GERD  Medicated and Controlled,  Endo/Other  diabetes, Poorly Controlled, Type 2, Insulin Dependent  Renal/GU CRFRenal disease  negative genitourinary   Musculoskeletal negative musculoskeletal ROS (+)   Abdominal   Peds  Hematology  (+) anemia , HIV,   Anesthesia Other Findings   Reproductive/Obstetrics                           Anesthesia Physical Anesthesia Plan  ASA: IV  Anesthesia Plan: General   Post-op Pain Management:    Induction: Intravenous  PONV Risk Score and Plan: 2 and Propofol infusion, Ondansetron and Treatment may vary due to age or medical condition  Airway Management Planned: LMA  Additional Equipment:   Intra-op Plan:   Post-operative Plan: Extubation in OR  Informed Consent: I have reviewed the patients History and Physical, chart, labs and discussed the procedure including the risks, benefits and alternatives for the proposed anesthesia with the patient or authorized representative who has indicated his/her understanding and acceptance.     Dental advisory given  Plan Discussed with: CRNA and Surgeon  Anesthesia Plan Comments:        Anesthesia Quick Evaluation

## 2018-10-13 NOTE — Progress Notes (Signed)
   Subjective: Required dilaudid overnight for muscle spasms not relieved by oxycodone. She did not receive her prn tylenol or robaxin at the time. This morning she complains of some mild heartburn, but otherwise feeling well. Denies n/v or confusion. Continues to work well with PT. Discussed plan for fistula placement tomorrow. All other questions and concerns were addressed.   Objective:  Vital signs in last 24 hours: Vitals:   10/12/18 0831 10/12/18 1423 10/12/18 2008 10/13/18 0352  BP: 135/70 (!) 131/109 125/72 134/77  Pulse: 80 84 84 77  Resp: 16 16 18 18   Temp: 98.4 F (36.9 C) 98.2 F (36.8 C) 98.7 F (37.1 C) 98.2 F (36.8 C)  TempSrc: Oral Oral Oral Oral  SpO2: 100% 100% 100% 99%  Weight:      Height:       General: awake, alert, sitting up in bed in NAD CV: RRR; no murmurs Pulm: normal respiratory effort; lungs CTA bilaterally Ext: s/p left BKA; no edema  Assessment/Plan:  Principal Problem:   Osteomyelitis (HCC) Active Problems:   Diabetes mellitus type 2 in obese (HCC)   Essential hypertension   Human immunodeficiency virus (HIV) disease (HCC)   CKD (chronic kidney disease), stage V (HCC)   Wound infection s/p L transmetatarsal amputation   Dehiscence of amputation stump (HCC)   Moderate protein-calorie malnutrition (HCC)  Kathy Frank is a 43 year old female with past medical history of insulin-dependent diabetes, HTN, CKD stage V, and HIV with 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 She presented withpain and drainage of the left foot. She underwenttranstibialamputation on 2/29/2020given the patient's comorbid morbid conditions with poor wound healing.  Osteomyelitis, left foot -S/pL BKAamputation on 10/08/2018.Patient's pain is currently well-controlled. - PT recommending home health PT with 24 hour assistance. Case management consulted for assistance witharranging home health. - Afebrile for 48 hrs. No  leukocytosis. No signs of DVT or infection. Plan - Pain control: POoxycodone10mg  every 4 hours as needed -Continuegabapentin 300mg  TID - Continue Robaxin q6hrs prn for muscle spasms. This should be tried before dilaudid as her pain is spasmic in nature. - Outpatient f/u with ortho 1 week post-op (around 3/7). Continue wound vac until then.  Acute blood loss anemia  - Patient has history of anemia 2/2 CKD. Baseline hemoglobin 8-9. Decreasedto 5.7 on post-op day 2, requiring 2u RBCs. Drop in hemoglobin likely combination of preexisting anemia 2/2 CKD as well as surgical bleeding. Hb appropriately increased to 7.7post-transfusion. Hb 7.1 today. Plan -Trend Hb -Aranespand IV ironper nephro  CKD stage V -Creatinine 6.71 on admission. Elevated to 7.1today. Kcontinues to be increased at 5.3 today. - No signs of volume overload or uremia Plan - Consulted nephrology, appreciate recs - Vascular consulted, appreciate recs. Plan for LUE AV graft placement tomorrow.  - Lokelma 10g daily - Phoslo, aranesp, sodium bicarb per nephro - trend BMP   Hypertension - Blood pressure well controlled on current regimen Plan -Continue amlodipine 10 mg dailyandlabetalolto200TID  Diabetes mellitus - Am glucose 93. Pt did not receive evening lantus because her blood sugar was only 103. She received 5u of SSI in total yesterday. Her worsened renal function may be attributing to her decreased insulin requirement.  Plan -Decrease Lantus to 5u qhs -SSI  Dispo: Anticipated discharge after AVF placement Friday  Corinne Ports, MD 10/13/2018, 6:36 AM Pager: [phone]

## 2018-10-13 NOTE — Plan of Care (Signed)
  Problem: Skin Integrity: Goal: Risk for impaired skin integrity will decrease Outcome: Progressing   

## 2018-10-13 NOTE — Progress Notes (Signed)
Patient c/o stump pain unrelieved with oxycodone. Called Dr. Jeanmarie Hubert and received Dilaudid 0.5 mg x one dose. Order already implemented. Will continue to monitor.

## 2018-10-13 NOTE — Progress Notes (Signed)
[facility] NEPHROLOGY PROGRESS NOTE  Assessment/ Plan: Pt is a 43 y.o. yo female with poorly controlled diabetes, hypertension, HIV CD4 count 450, CKD stage V followed by Dr. 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  #CKD stage V due to poorly controlled diabetes and hypertension: Serum creatinine level worsening to 7.12 associated with mild hyperkalemia.  She has no fluid overload and not uremic on exam.  Plan for AV fistula tomorrow by vascular.  Patient does not want to have catheter placed for dialysis.  Hopefully she can wait until fistula matures to start dialysis.  Continue to monitor renal function.  Continue Lokelma.    #Hyperkalemia: Recommend low potassium diet.  Continue Lokelma every day.  Monitor labs.  Dietary referral.  #Anemia of CKD: Acute worsening likely due to acute blood loss.  Iron saturation 6%, continue IV iron and continue ESA.  Blood transfusion as needed.  #Secondary hyperparathyroidism: Continue PhosLo, monitor phosphorus and calcium level.  PTH level 252, on calcitriol.  #PAD status post left BKA: Per Ortho.  #HIV on medication, CD4 count 150 in May 2019  #Hypertension: Continue current antihypertensive medication.  Subjective: Seen and examined at bedside.  Reported feeling good.  Plan for surgery tomorrow.  Denied headache, dizziness, nausea, vomiting, chest pain, shortness of breath.  Eating good.  Patient states that she does not want to have catheter for dialysis. Objective Vital signs in last 24 hours: Vitals:   10/12/18 0831 10/12/18 1423 10/12/18 2008 10/13/18 0352  BP: 135/70 (!) 131/109 125/72 134/77  Pulse: 80 84 84 77  Resp: 16 16 18 18   Temp: 98.4 F (36.9 C) 98.2 F (36.8 C) 98.7 F (37.1 C) 98.2 F (36.8 C)  TempSrc: Oral Oral Oral Oral  SpO2: 100% 100% 100% 99%  Weight:      Height:       Weight change:   Intake/Output  Summary (Last 24 hours) at 10/13/2018 1219 Last data filed at 10/13/2018 0900 Gross per 24 hour  Intake 720 ml  Output 1 ml  Net 719 ml       Labs: Basic Metabolic Panel: Recent Labs  Lab 10/11/18 0748 10/12/18 0513 10/13/18 0318  NA 136 134* 135  K 5.5* 5.4* 5.3*  CL 110 107 102  CO2 18* 19* 22  GLUCOSE 89 78 93  BUN 39* 40* 45*  CREATININE 6.63* 6.81* 7.12*  CALCIUM 7.7* 7.9* 7.9*  PHOS 5.7* 5.5* 6.2*   Liver Function Tests: Recent Labs  Lab 10/06/18 2222  10/11/18 0748 10/12/18 0513 10/13/18 0318  AST 11*  --   --   --   --   ALT 8  --   --   --   --   ALKPHOS 101  --   --   --   --   BILITOT 0.1*  --   --   --   --   PROT 7.5  --   --   --   --   ALBUMIN 3.2*   < > 2.5* 2.6* 2.5*   < > = values in this interval not displayed.   No results for input(s): LIPASE, AMYLASE in the last 168 hours. No results for input(s): AMMONIA in the last 168 hours. CBC: Recent Labs  Lab 10/06/18 2222  10/08/18 0312  10/10/18 0321  10/11/18 0748 10/12/18 0513 10/13/18 0318  WBC 7.6  --  6.2  --  7.6  --  7.8 6.8 6.0  NEUTROABS 4.8  --   --   --   --   --   --   --   --   HGB 8.0*   < > 7.3*  --  5.7*   < > 7.1* 7.5* 7.1*  HCT 26.7*   < > 23.6*   < > 18.1*   < > 22.2* 23.3* 21.8*  MCV 94.3  --  92.5  --  92.3  --  87.7 89.6 89.0  PLT 278  --  260  --  196  --  194 247 258   < > = values in this interval not displayed.   Cardiac Enzymes: No results for input(s): CKTOTAL, CKMB, CKMBINDEX, TROPONINI in the last 168 hours. CBG: Recent Labs  Lab 10/12/18 1127 10/12/18 1600 10/12/18 2124 10/13/18 0750 10/13/18 1131  GLUCAP 149* 134* 103* 101* 131*    Iron Studies:  No results for input(s): IRON, TIBC, TRANSFERRIN, FERRITIN in the last 72 hours. Studies/Results: Vas [place] Upper Extremity Arterial Duplex  Result Date: 10/11/2018 UPPER EXTREMITY DUPLEX STUDY Indications: HD access planning.  Performing Technologist: Oliver Hum RVT  Examination Guidelines: A complete  evaluation includes B-mode imaging, spectral Doppler, color Doppler, and power Doppler as needed of all accessible portions of each vessel. Bilateral testing is considered an integral part of a complete examination. Limited examinations for reoccurring indications may be performed as noted.  Right Doppler Findings: +----------+----------+---------+------+--------+ Site      PSV (cm/s)Waveform PlaqueComments +----------+----------+---------+------+--------+ [id number]       triphasic               +----------+----------+---------+------+--------+ Brachial  120       triphasic               +----------+----------+---------+------+--------+ Radial    93        triphasic               +----------+----------+---------+------+--------+ Ulnar     100       triphasic               +----------+----------+---------+------+--------+ Left Doppler Findings: +----------+----------+---------+------+--------+ Site      PSV (cm/s)Waveform PlaqueComments +----------+----------+---------+------+--------+ [id number]       triphasic               +----------+----------+---------+------+--------+ Brachial  128       triphasic               +----------+----------+---------+------+--------+ Radial    124       triphasic               +----------+----------+---------+------+--------+ Ulnar     92        triphasic               +----------+----------+---------+------+--------+  Summary:  Right: <50% stenosis visualized in the right upper extremity. Left: <50% stenosis visualized in the left upper extremity. *See table(s) above for measurements and observations. Electronically signed by Deitra Mayo MD on 10/11/2018 at 3:55:53 PM.    Final    Vas [place] Upper Ext Vein Mapping (pre-op Avf)  Result Date: 10/11/2018 UPPER EXTREMITY VEIN MAPPING  Indications: Pre-access. Performing Technologist: Oliver Hum RVT  Examination Guidelines: A complete evaluation includes B-mode  imaging, spectral Doppler, color Doppler, and power Doppler as needed of all accessible portions of each vessel. Bilateral testing is considered an integral part of a complete examination. Limited examinations for reoccurring indications may be performed  as noted. +-----------------+-------------+----------+----------------+ Right Cephalic   Diameter (cm)Depth (cm)    Findings     +-----------------+-------------+----------+----------------+ Shoulder             0.25        0.65                    +-----------------+-------------+----------+----------------+ Prox upper arm       0.27        0.43      branching     +-----------------+-------------+----------+----------------+ Mid upper arm        0.28        0.34                    +-----------------+-------------+----------+----------------+ Dist upper arm       0.27        0.42                    +-----------------+-------------+----------+----------------+ Antecubital fossa    0.32        0.38      branching     +-----------------+-------------+----------+----------------+ Prox forearm         0.29        0.13   Partial thrombus +-----------------+-------------+----------+----------------+ Mid forearm          0.15        0.31                    +-----------------+-------------+----------+----------------+ Dist forearm         0.07        0.18                    +-----------------+-------------+----------+----------------+ +-----------------+-------------+----------+---------+ Right Basilic    Diameter (cm)Depth (cm)Findings  +-----------------+-------------+----------+---------+ Shoulder             0.24        1.08             +-----------------+-------------+----------+---------+ Mid upper arm        0.21        1.06             +-----------------+-------------+----------+---------+ Dist upper arm       0.33        0.51   branching +-----------------+-------------+----------+---------+  Antecubital fossa    0.14        0.34             +-----------------+-------------+----------+---------+ Prox forearm         0.16        0.17             +-----------------+-------------+----------+---------+ Mid forearm          0.15        0.16             +-----------------+-------------+----------+---------+ Distal forearm       0.17        0.13             +-----------------+-------------+----------+---------+ +-----------------+-------------+----------+---------+ Left Cephalic    Diameter (cm)Depth (cm)Findings  +-----------------+-------------+----------+---------+ Shoulder             0.38        0.47             +-----------------+-------------+----------+---------+ Prox upper arm       0.27        0.42   branching +-----------------+-------------+----------+---------+ Mid upper arm        0.24  0.52   branching +-----------------+-------------+----------+---------+ Dist upper arm       0.17        0.39             +-----------------+-------------+----------+---------+ Antecubital fossa    0.11        0.32             +-----------------+-------------+----------+---------+ Prox forearm         0.11        0.19             +-----------------+-------------+----------+---------+ Mid forearm          0.12        0.15             +-----------------+-------------+----------+---------+ Dist forearm         0.12        0.18             +-----------------+-------------+----------+---------+ +-----------------+-------------+----------+---------+ Left Basilic     Diameter (cm)Depth (cm)Findings  +-----------------+-------------+----------+---------+ Shoulder             0.42        1.00             +-----------------+-------------+----------+---------+ Mid upper arm        0.40        0.82             +-----------------+-------------+----------+---------+ Dist upper arm       0.37        0.62   branching  +-----------------+-------------+----------+---------+ Antecubital fossa    0.32        0.34             +-----------------+-------------+----------+---------+ Prox forearm         0.25        0.17   branching +-----------------+-------------+----------+---------+ Mid forearm          0.19        0.20             +-----------------+-------------+----------+---------+ Distal forearm       0.16        0.16             +-----------------+-------------+----------+---------+ *See table(s) above for measurements and observations.  Diagnosing physician: Deitra Mayo MD Electronically signed by Deitra Mayo MD on 10/11/2018 at 3:56:15 PM.    Final     Medications: Infusions: . sodium chloride    . sodium chloride 10 mL/hr at 10/09/18 1400  . ferric gluconate (FERRLECIT/NULECIT) IV 125 mg (10/13/18 0853)  . methocarbamol (ROBAXIN) IV Stopped (10/09/18 1275)    Scheduled Medications: . amLODipine  10 mg Oral Daily  . calcitRIOL  0.25 mcg Oral Daily  . calcium acetate  1,334 mg Oral TID WC  . citalopram  20 mg Oral Daily  . darbepoetin (ARANESP) injection - NON-DIALYSIS  60 mcg Subcutaneous Q Tue-1800  . docusate sodium  100 mg Oral BID  . dolutegravir  50 mg Oral Q breakfast   And  . rilpivirine  25 mg Oral Q breakfast  . gabapentin  300 mg Oral Daily  . heparin  5,000 Units Subcutaneous Q8H  . insulin aspart  0-15 Units Subcutaneous TID WC  . insulin glargine  5 Units Subcutaneous Q2200  . labetalol  200 mg Oral TID  . sodium bicarbonate  1,300 mg Oral TID  . [START ON 10/14/2018] sodium zirconium cyclosilicate  10 g Oral Daily    have reviewed scheduled and prn medications.  Physical Exam: General:  Not in distress, able to lie flat Heart:RRR, s1s2 nl, no rubs Lungs: Clear bilateral, no wheezing or crackles Abdomen:soft, Non-tender, non-distended Extremities: No edema Neurology: Alert awake, no asterixis  Yamna Mackel Prasad Coury Grieger 10/13/2018,12:19 PM  LOS: 6  days

## 2018-10-14 ENCOUNTER — Inpatient Hospital Stay (HOSPITAL_COMMUNITY): Payer: Medicaid Other | Admitting: Anesthesiology

## 2018-10-14 ENCOUNTER — Encounter (HOSPITAL_COMMUNITY)
Admission: EM | Disposition: A | Discharge status: Home / Self Care | Payer: Self-pay | Source: Home / Self Care | Attending: Internal Medicine

## 2018-10-14 DIAGNOSIS — N185 Chronic kidney disease, stage 5: Secondary | ICD-10-CM

## 2018-10-14 HISTORY — PX: AV FISTULA PLACEMENT: SHX1204

## 2018-10-14 LAB — RENAL FUNCTION PANEL
Albumin: 2.6 g/dL — ABNORMAL LOW (ref 3.5–5.0)
Anion gap: 8 (ref 5–15)
BUN: 50 mg/dL — ABNORMAL HIGH (ref 6–20)
CO2: 22 mmol/L (ref 22–32)
Calcium: 8.1 mg/dL — ABNORMAL LOW (ref 8.9–10.3)
Chloride: 103 mmol/L (ref 98–111)
Creatinine, Ser: 7.08 mg/dL — ABNORMAL HIGH (ref 0.44–1.00)
GFR calc Af Amer: 8 mL/min — ABNORMAL LOW (ref >60)
GFR calc non Af Amer: 7 mL/min — ABNORMAL LOW (ref >60)
Glucose, Bld: 119 mg/dL — ABNORMAL HIGH (ref 70–99)
Phosphorus: 5.6 mg/dL — ABNORMAL HIGH (ref 2.5–4.6)
Potassium: 5.9 mmol/L — ABNORMAL HIGH (ref 3.5–5.1)
Sodium: 133 mmol/L — ABNORMAL LOW (ref 135–145)

## 2018-10-14 LAB — CBC
HCT: 22.1 % — ABNORMAL LOW (ref 36.0–46.0)
Hemoglobin: 6.9 g/dL — CL (ref 12.0–15.0)
MCH: 28.4 pg (ref 26.0–34.0)
MCHC: 31.2 g/dL (ref 30.0–36.0)
MCV: 90.9 fL (ref 80.0–100.0)
Platelets: 286 10*3/uL (ref 150–400)
RBC: 2.43 MIL/uL — ABNORMAL LOW (ref 3.87–5.11)
RDW: 15.7 % — ABNORMAL HIGH (ref 11.5–15.5)
WBC: 7.4 10*3/uL (ref 4.0–10.5)
nRBC: 0 % (ref 0.0–0.2)

## 2018-10-14 LAB — GLUCOSE, CAPILLARY
Glucose-Capillary: 118 mg/dL — ABNORMAL HIGH (ref 70–99)
Glucose-Capillary: 236 mg/dL — ABNORMAL HIGH (ref 70–99)
Glucose-Capillary: 343 mg/dL — ABNORMAL HIGH (ref 70–99)
Glucose-Capillary: 149 mg/dL — ABNORMAL HIGH (ref 70–99)

## 2018-10-14 LAB — PREPARE RBC (CROSSMATCH)

## 2018-10-14 SURGERY — ARTERIOVENOUS (AV) FISTULA CREATION
Anesthesia: General | Site: Arm Upper | Laterality: Left

## 2018-10-14 MED ORDER — LIDOCAINE HCL (CARDIAC) PF 100 MG/5ML IV SOSY
PREFILLED_SYRINGE | INTRAVENOUS | Status: DC | PRN
  Administered 2018-10-14: 40 mg via INTRAVENOUS

## 2018-10-14 MED ORDER — FENTANYL CITRATE (PF) 100 MCG/2ML IJ SOLN: 25.0000 ug | INTRAMUSCULAR | Status: DC | PRN

## 2018-10-14 MED ORDER — 0.9 % SODIUM CHLORIDE (POUR BTL) OPTIME
TOPICAL | Status: DC | PRN
  Administered 2018-10-14: 1000 mL

## 2018-10-14 MED ORDER — FENTANYL CITRATE (PF) 250 MCG/5ML IJ SOLN
INTRAMUSCULAR | Status: AC
  Filled 2018-10-14: qty 5

## 2018-10-14 MED ORDER — MIDAZOLAM HCL 5 MG/5ML IJ SOLN
INTRAMUSCULAR | Status: DC | PRN
  Administered 2018-10-14: 2 mg via INTRAVENOUS

## 2018-10-14 MED ORDER — SODIUM CHLORIDE 0.9% IV SOLUTION
Freq: Once | INTRAVENOUS | Status: AC
  Administered 2018-10-14: 14:00:00 via INTRAVENOUS

## 2018-10-14 MED ORDER — DEXAMETHASONE SODIUM PHOSPHATE 10 MG/ML IJ SOLN
INTRAMUSCULAR | Status: DC | PRN
  Administered 2018-10-14: 4 mg via INTRAVENOUS

## 2018-10-14 MED ORDER — HEPARIN SODIUM (PORCINE) 1000 UNIT/ML IJ SOLN
INTRAMUSCULAR | Status: DC | PRN
  Administered 2018-10-14: 3000 [IU] via INTRAVENOUS

## 2018-10-14 MED ORDER — CEFAZOLIN SODIUM-DEXTROSE 2-4 GM/100ML-% IV SOLN
INTRAVENOUS | Status: AC
  Filled 2018-10-14: qty 100

## 2018-10-14 MED ORDER — JUVEN PO PACK
1.0000 | PACK | Freq: Two times a day (BID) | ORAL | Status: DC
  Administered 2018-10-14 – 2018-10-16 (×5): 1 via ORAL
  Filled 2018-10-14 (×5): qty 1

## 2018-10-14 MED ORDER — ONDANSETRON HCL 4 MG/2ML IJ SOLN: 4.0000 mg | Freq: Once | INTRAMUSCULAR | Status: DC | PRN

## 2018-10-14 MED ORDER — SODIUM ZIRCONIUM CYCLOSILICATE 10 G PO PACK
10.0000 g | PACK | Freq: Two times a day (BID) | ORAL | Status: DC
  Administered 2018-10-14 – 2018-10-15 (×2): 10 g via ORAL
  Filled 2018-10-14 (×2): qty 1

## 2018-10-14 MED ORDER — PROPOFOL 10 MG/ML IV BOLUS
INTRAVENOUS | Status: AC
  Filled 2018-10-14: qty 20

## 2018-10-14 MED ORDER — ADULT MULTIVITAMIN W/MINERALS CH: 1.0000 | ORAL_TABLET | Freq: Every day | ORAL | Status: DC

## 2018-10-14 MED ORDER — FENTANYL CITRATE (PF) 250 MCG/5ML IJ SOLN
INTRAMUSCULAR | Status: DC | PRN
  Administered 2018-10-14: 50 ug via INTRAVENOUS

## 2018-10-14 MED ORDER — FUROSEMIDE 10 MG/ML IJ SOLN
40.0000 mg | Freq: Once | INTRAMUSCULAR | Status: AC
  Administered 2018-10-14: 40 mg via INTRAVENOUS
  Filled 2018-10-14: qty 4

## 2018-10-14 MED ORDER — EPHEDRINE SULFATE 50 MG/ML IJ SOLN
INTRAMUSCULAR | Status: DC | PRN
  Administered 2018-10-14: 5 mg via INTRAVENOUS
  Administered 2018-10-14: 10 mg via INTRAVENOUS

## 2018-10-14 MED ORDER — SODIUM CHLORIDE 0.9 % IV SOLN
INTRAVENOUS | Status: DC | PRN
  Administered 2018-10-14: 08:00:00

## 2018-10-14 MED ORDER — PROPOFOL 10 MG/ML IV BOLUS
INTRAVENOUS | Status: DC | PRN
  Administered 2018-10-14: 150 mg via INTRAVENOUS

## 2018-10-14 MED ORDER — MIDAZOLAM HCL 2 MG/2ML IJ SOLN
INTRAMUSCULAR | Status: AC
  Filled 2018-10-14: qty 2

## 2018-10-14 MED ORDER — PHENYLEPHRINE HCL 10 MG/ML IJ SOLN
INTRAMUSCULAR | Status: DC | PRN
  Administered 2018-10-14 (×2): 80 ug via INTRAVENOUS
  Administered 2018-10-14: 120 ug via INTRAVENOUS
  Administered 2018-10-14: 80 ug via INTRAVENOUS

## 2018-10-14 MED ORDER — SODIUM CHLORIDE 0.9 % IV SOLN
INTRAVENOUS | Status: DC | PRN
  Administered 2018-10-14: 30 ug/min via INTRAVENOUS

## 2018-10-14 MED ORDER — CEFAZOLIN SODIUM-DEXTROSE 2-3 GM-%(50ML) IV SOLR
INTRAVENOUS | Status: DC | PRN
  Administered 2018-10-14: 2 g via INTRAVENOUS

## 2018-10-14 MED ORDER — PRO-STAT SUGAR FREE PO LIQD
30.0000 mL | Freq: Two times a day (BID) | ORAL | Status: DC
  Administered 2018-10-14 – 2018-10-16 (×4): 30 mL via ORAL
  Filled 2018-10-14 (×4): qty 30

## 2018-10-14 MED ORDER — SODIUM CHLORIDE 0.9% IV SOLUTION
Freq: Once | INTRAVENOUS | Status: AC
  Administered 2018-10-14: 23:00:00 via INTRAVENOUS

## 2018-10-14 MED ORDER — LIDOCAINE HCL (PF) 1 % IJ SOLN
INTRAMUSCULAR | Status: AC
  Filled 2018-10-14: qty 30

## 2018-10-14 MED ORDER — SODIUM CHLORIDE 0.9 % IV SOLN
INTRAVENOUS | Status: AC
  Filled 2018-10-14: qty 1.2

## 2018-10-14 SURGICAL SUPPLY — 48 items
ADH SKN CLS APL DERMABOND .7 (GAUZE/BANDAGES/DRESSINGS) ×2
ADH SKN CLS LQ APL DERMABOND (GAUZE/BANDAGES/DRESSINGS) ×2
AGENT HMST SPONGE THK3/8 (HEMOSTASIS)
ARMBAND PINK RESTRICT EXTREMIT (MISCELLANEOUS) ×4 IMPLANT
CANISTER SUCT 3000ML PPV (MISCELLANEOUS) ×3 IMPLANT
CLIP VESOCCLUDE MED 6/CT (CLIP) ×3 IMPLANT
CLIP VESOCCLUDE SM WIDE 6/CT (CLIP) ×3 IMPLANT
COVER PROBE W GEL 5X96 (DRAPES) ×3 IMPLANT
COVER WAND RF STERILE (DRAPES) ×3 IMPLANT
DECANTER SPIKE VIAL GLASS SM (MISCELLANEOUS) ×3 IMPLANT
DERMABOND ADHESIVE PROPEN (GAUZE/BANDAGES/DRESSINGS) ×1
DERMABOND ADVANCED (GAUZE/BANDAGES/DRESSINGS) ×1
DERMABOND ADVANCED .7 DNX12 (GAUZE/BANDAGES/DRESSINGS) ×2 IMPLANT
DERMABOND ADVANCED .7 DNX6 (GAUZE/BANDAGES/DRESSINGS) ×1 IMPLANT
ELECT REM PT RETURN 9FT ADLT (ELECTROSURGICAL) ×3
ELECTRODE REM PT RTRN 9FT ADLT (ELECTROSURGICAL) ×2 IMPLANT
GLOVE BIO SURGEON STRL SZ 6.5 (GLOVE) ×4 IMPLANT
GLOVE BIO SURGEON STRL SZ7.5 (GLOVE) ×3 IMPLANT
GLOVE BIOGEL PI IND STRL 6.5 (GLOVE) ×1 IMPLANT
GLOVE BIOGEL PI IND STRL 7.0 (GLOVE) ×1 IMPLANT
GLOVE BIOGEL PI IND STRL 7.5 (GLOVE) ×1 IMPLANT
GLOVE BIOGEL PI IND STRL 8 (GLOVE) ×2 IMPLANT
GLOVE BIOGEL PI INDICATOR 6.5 (GLOVE) ×1
GLOVE BIOGEL PI INDICATOR 7.0 (GLOVE) ×1
GLOVE BIOGEL PI INDICATOR 7.5 (GLOVE) ×1
GLOVE BIOGEL PI INDICATOR 8 (GLOVE) ×1
GLOVE SURG SS PI 6.5 STRL IVOR (GLOVE) ×2 IMPLANT
GOWN STRL NON-REIN LRG LVL3 (GOWN DISPOSABLE) ×2 IMPLANT
GOWN STRL REUS W/ TWL LRG LVL3 (GOWN DISPOSABLE) ×4 IMPLANT
GOWN STRL REUS W/ TWL XL LVL3 (GOWN DISPOSABLE) ×4 IMPLANT
GOWN STRL REUS W/TWL LRG LVL3 (GOWN DISPOSABLE) ×6
GOWN STRL REUS W/TWL XL LVL3 (GOWN DISPOSABLE) ×6
HEMOSTAT SPONGE AVITENE ULTRA (HEMOSTASIS) IMPLANT
KIT BASIN OR (CUSTOM PROCEDURE TRAY) ×3 IMPLANT
KIT TURNOVER KIT B (KITS) ×3 IMPLANT
NS IRRIG 1000ML POUR BTL (IV SOLUTION) ×3 IMPLANT
PACK CV ACCESS (CUSTOM PROCEDURE TRAY) ×3 IMPLANT
PAD ARMBOARD 7.5X6 YLW CONV (MISCELLANEOUS) ×6 IMPLANT
SUT GORETEX 6.0 TT13 (SUTURE) IMPLANT
SUT MNCRL AB 4-0 PS2 18 (SUTURE) ×3 IMPLANT
SUT PROLENE 6 0 BV (SUTURE) ×5 IMPLANT
SUT PROLENE 7 0 BV 1 (SUTURE) IMPLANT
SUT SILK 2 0 PERMA HAND 18 BK (SUTURE) ×2 IMPLANT
SUT VIC AB 3-0 SH 27 (SUTURE) ×6
SUT VIC AB 3-0 SH 27X BRD (SUTURE) ×4 IMPLANT
TOWEL GREEN STERILE (TOWEL DISPOSABLE) ×3 IMPLANT
UNDERPAD 30X30 (UNDERPADS AND DIAPERS) ×3 IMPLANT
WATER STERILE IRR 1000ML POUR (IV SOLUTION) ×3 IMPLANT

## 2018-10-14 NOTE — Progress Notes (Signed)
Subjective: Day of Surgery Procedure(s) (LRB): Arteriovenous (Av) Fistula Creation Left Arm (Left) Patient currently having AV fistula placed.  Now POD#7 after left BKA.  Objective: Vital signs in last 24 hours: Temp:  [97.6 F (36.4 C)-100.6 F (38.1 C)] 97.6 F (36.4 C) (03/06 0907) Pulse Rate:  [72-85] 76 (03/06 0923) Resp:  [11-18] 13 (03/06 0923) BP: (126-150)/(79-89) 141/82 (03/06 0923) SpO2:  [95 %-100 %] 96 % (03/06 0923)  Intake/Output from previous day: 03/05 0701 - 03/06 0700 In: 240 [P.O.:240] Out: 0  Intake/Output this shift: Total I/O In: 700 [P.O.:200; I.V.:500] Out: 15 [Blood:15]  Recent Labs    10/12/18 0513 10/13/18 0318 10/14/18 0216  HGB 7.5* 7.1* 6.9*   Recent Labs    10/13/18 0318 10/14/18 0216  WBC 6.0 7.4  RBC 2.45* 2.43*  HCT 21.8* 22.1*  PLT 258 286   Recent Labs    10/13/18 0318 10/14/18 0216  NA 135 133*  K 5.3* 5.9*  CL 102 103  CO2 22 22  BUN 45* 50*  CREATININE 7.12* 7.08*  GLUCOSE 93 119*  CALCIUM 7.9* 8.1*   No results for input(s): LABPT, INR in the last 72 hours.     Assessment/Plan: Day of Surgery Procedure(s) (LRB): Arteriovenous (Av) Fistula Creation Left Arm (Left) Will DC VAC dressing to the left BKA and begin dry gauze dressing changes with Ace wrapping for edema control.  Will need to follow up in the office in 1 week following discharge.   Erlinda Hong, PA-C 10/14/2018, 9:34 AM  [place]

## 2018-10-14 NOTE — Progress Notes (Signed)
PT Cancellation Note  Patient Details Name: Kathy Frank MRN: [id number] DOB: May 19, 1976   Cancelled Treatment:    Reason Eval/Treat Not Completed: Patient at procedure or test/unavailable   Carliss Porcaro B Freddy Kinne 10/14/2018, 7:42 AM  Elwyn Reach, PT Acute Rehabilitation Services Pager: [phone] Office: [phone]

## 2018-10-14 NOTE — Anesthesia Postprocedure Evaluation (Signed)
Anesthesia Post Note  Patient: Kathy Frank  Procedure(s) Performed: Arteriovenous (Av) Fistula Creation Left Arm (Left Arm Upper)     Patient location during evaluation: PACU Anesthesia Type: General Level of consciousness: awake and alert and oriented Pain management: pain level controlled Vital Signs Assessment: post-procedure vital signs reviewed and stable Respiratory status: spontaneous breathing, nonlabored ventilation and respiratory function stable Cardiovascular status: blood pressure returned to baseline and stable Postop Assessment: no apparent nausea or vomiting Anesthetic complications: no    Last Vitals:  Vitals:   10/14/18 0923 10/14/18 0937  BP: (!) 141/82 (!) 143/86  Pulse: 76 81  Resp: 13 15  Temp:    SpO2: 96% 99%    Last Pain:  Vitals:   10/14/18 0937  TempSrc:   PainSc: 0-No pain                 Roslynn Holte A.

## 2018-10-14 NOTE — Progress Notes (Signed)
[facility] NEPHROLOGY PROGRESS NOTE  Assessment/ Plan: Pt is a 43 y.o. yo female with poorly controlled diabetes, hypertension, HIV CD4 count 450, CKD stage V followed by Dr. 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  #CKD stage V due to poorly controlled diabetes and hypertension: Serum creatinine level stable around 7 associated with mild hyperkalemia.  She has no fluid overload and not uremic on exam.   -Status post left first stage basilic vein transposition, brachiobasilic AV fistula by Dr. Carlis Abbott on 10/14/2018. - Patient does not want to have catheter placed for dialysis.  Hopefully she can wait until fistula matures to start dialysis.  No urgent indication for dialysis today except mild hyperkalemia.  On sodium bicarbonate.  Continue to monitor renal function.      #Hyperkalemia:  -Seen by dietitian for restricting high potassium diet.  Potassium 5.9 today.  Increase Lokelma to twice a day.  Recommend a dose of Lasix between blood transfusion.  Repeat lab in the evening.  #Anemia of CKD: Acute worsening likely due to acute blood loss.  Receiving 2 units of blood transfusion today.  Lasix as above.  Iron saturation 6%, continue IV iron and continue ESA.   #Secondary hyperparathyroidism: Continue PhosLo, monitor phosphorus and calcium level.  PTH level 252, on calcitriol.  #PAD status post left BKA: Per Ortho.  #HIV on medication, CD4 count 150 in May 2019  #Hypertension: Continue current antihypertensive medication.  Subjective: Seen and examined at bedside.  Had AV fistula placed today.  Denied nausea, vomiting, chest pain, shortness of breath. Objective Vital signs in last 24 hours: Vitals:   10/14/18 0535 10/14/18 0907 10/14/18 0923 10/14/18 0937  BP: (!) 150/88 (!) 142/79 (!) 141/82 (!) 143/86  Pulse: 82 72 76 81  Resp: 18 11 13 15   Temp: 98.6 F (37 C) 97.6 F (36.4 C)     TempSrc: Oral     SpO2: 100% 95% 96% 99%  Weight:      Height:       Weight change:   Intake/Output Summary (Last 24 hours) at 10/14/2018 1426 Last data filed at 10/14/2018 1100 Gross per 24 hour  Intake 900 ml  Output 15 ml  Net 885 ml       Labs: Basic Metabolic Panel: Recent Labs  Lab 10/12/18 0513 10/13/18 0318 10/14/18 0216  NA 134* 135 133*  K 5.4* 5.3* 5.9*  CL 107 102 103  CO2 19* 22 22  GLUCOSE 78 93 119*  BUN 40* 45* 50*  CREATININE 6.81* 7.12* 7.08*  CALCIUM 7.9* 7.9* 8.1*  PHOS 5.5* 6.2* 5.6*   Liver Function Tests: Recent Labs  Lab 10/12/18 0513 10/13/18 0318 10/14/18 0216  ALBUMIN 2.6* 2.5* 2.6*   No results for input(s): LIPASE, AMYLASE in the last 168 hours. No results for input(s): AMMONIA in the last 168 hours. CBC: Recent Labs  Lab 10/10/18 0321  10/11/18 0748 10/12/18 0513 10/13/18 0318 10/14/18 0216  WBC 7.6  --  7.8 6.8 6.0 7.4  HGB 5.7*   < > 7.1* 7.5* 7.1* 6.9*  HCT 18.1*   < > 22.2* 23.3* 21.8* 22.1*  MCV 92.3  --  87.7 89.6 89.0 90.9  PLT 196  --  194 247 258 286   < > = values in this interval not displayed.   Cardiac Enzymes: No results for input(s): CKTOTAL, CKMB, CKMBINDEX, TROPONINI in the last 168 hours. CBG:  Recent Labs  Lab 10/13/18 1632 10/13/18 1732 10/13/18 2220 10/14/18 0909 10/14/18 1156  GLUCAP 50* 98 151* 118* 236*    Iron Studies:  No results for input(s): IRON, TIBC, TRANSFERRIN, FERRITIN in the last 72 hours. Studies/Results: No results found.  Medications: Infusions: . sodium chloride    . sodium chloride 10 mL/hr at 10/09/18 1400  . methocarbamol (ROBAXIN) IV Stopped (10/09/18 4854)    Scheduled Medications: . sodium chloride   Intravenous Once  . amLODipine  10 mg Oral Daily  . calcitRIOL  0.25 mcg Oral Daily  . calcium acetate  1,334 mg Oral TID WC  . citalopram  20 mg Oral Daily  . darbepoetin (ARANESP) injection - NON-DIALYSIS  60 mcg Subcutaneous Q Tue-1800  . docusate sodium   100 mg Oral BID  . dolutegravir  50 mg Oral Q breakfast   And  . rilpivirine  25 mg Oral Q breakfast  . furosemide  40 mg Intravenous Once  . gabapentin  300 mg Oral Daily  . heparin  5,000 Units Subcutaneous Q8H  . insulin aspart  0-15 Units Subcutaneous TID WC  . insulin glargine  5 Units Subcutaneous Q2200  . labetalol  200 mg Oral TID  . sodium bicarbonate  1,300 mg Oral TID  . sodium zirconium cyclosilicate  10 g Oral BID    have reviewed scheduled and prn medications.  Physical Exam: General: Not in distress, able to lie flat Heart:RRR, s1s2 nl, no rubs Lungs: Clear bilateral, no wheezing or crackle Abdomen:soft, Non-tender, non-distended Extremities: No lower extremity edema. Neurology: Alert awake, no asterixis  Kathy Frank Kathy Frank 10/14/2018,2:26 PM  LOS: 7 days

## 2018-10-14 NOTE — Progress Notes (Signed)
[facility]  Subjective  - No complaints.  Doesn't want catheter.   Objective (!) 150/88 82 98.6 F (37 C) (Oral) 18 100%  Intake/Output Summary (Last 24 hours) at 10/14/2018 0714 Last data filed at 10/13/2018 1700 Gross per 24 hour  Intake 240 ml  Output 0 ml  Net 240 ml    Left radial and brachial pulse palpable  Laboratory Lab Results: Recent Labs    10/13/18 0318 10/14/18 0216  WBC 6.0 7.4  HGB 7.1* 6.9*  HCT 21.8* 22.1*  PLT 258 286   BMET Recent Labs    10/13/18 0318 10/14/18 0216  NA 135 133*  K 5.3* 5.9*  CL 102 103  CO2 22 22  GLUCOSE 93 119*  BUN 45* 50*  CREATININE 7.12* 7.08*  CALCIUM 7.9* 8.1*    COAG Lab Results  Component Value Date   INR 1.02 08/11/2018   INR 1.19 07/02/2018   No results found for: PTT  Assessment/Planning:  Left arm AV fistula vs graft.  Left arm marked.  Risks and benefits discussed with patient.  If use basilic vein discussed two stages.  [name] 10/14/2018 7:14 AM --

## 2018-10-14 NOTE — Plan of Care (Signed)
  Problem: Elimination: Goal: Will not experience complications related to bowel motility Outcome: Progressing Goal: Will not experience complications related to urinary retention Outcome: Progressing   

## 2018-10-14 NOTE — Progress Notes (Signed)
   Subjective: No overnight events. Kathy Frank had her LUE AVF placed this morning. She is seen after this procedure. She says everything went well and she feels good. Her left leg pain is improving. All of her questions were answered.  Objective:  Vital signs in last 24 hours: Vitals:   10/13/18 1358 10/13/18 1740 10/13/18 2048 10/14/18 0535  BP: (!) 142/83  126/89 (!) 150/88  Pulse: 78  85 82  Resp: 16  18 18   Temp: (!) 100.6 F (38.1 C) 98.7 F (37.1 C) 97.8 F (36.6 C) 98.6 F (37 C)  TempSrc: Oral  Oral Oral  SpO2: 97%  100% 100%  Weight:      Height:       Gen: sitting up in bed eating lunch, no distress CV: RRR, no murmurs Pulm: CTAB, normal effort on room air Ext: LUE with new surgical incision covered in dermabond, no lower extremity edema  Assessment/Plan:  Principal Problem:   Osteomyelitis (HCC) Active Problems:   Diabetes mellitus type 2 in obese (HCC)   Essential hypertension   Human immunodeficiency virus (HIV) disease (HCC)   CKD (chronic kidney disease), stage V (HCC)   Wound infection s/p L transmetatarsal amputation   Dehiscence of amputation stump (HCC)   Moderate protein-calorie malnutrition (HCC)  Kathy Frank is a 43 year old female with past medical history of insulin-dependent diabetes, HTN, CKD stage V, and HIV with 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 She presented withpain and drainage of the left foot. She underwent left BKA on 10/08/2018. Her hospital course was complicated by worsening kidney function and placement of a left AVF on 3/6.  Osteomyelitis, left foot -S/pL BKAamputation on 10/08/2018.Patient's pain is currently well-controlled. - PT recommending home health PT with 24 hour assistance. Case management consulted for assistance witharranging home health. Plan - Ortho following, appreciate recs - Pain control: POoxycodone10mg  every 4 hours as needed -Continuegabapentin 300mg  TID -  Continue Robaxin q6hrs prn for muscle spasms. This should be tried before dilaudid as her pain is spasmic in nature. - Discontinue wound vac today per ortho - Outpatient f/u with ortho 1 week after discharge.   Acute blood loss anemia  - Patient has history of anemia 2/2 CKD. Baseline hemoglobin 8-9. Decreasedto 5.7on post-op day 2, requiring 2u RBCs. Drop in hemoglobin likely combination of preexisting anemia 2/2 CKD as well as surgical bleeding. Hb decreased to 6.9 today.  Plan -Transfuse 2 units of blood - Administer lasix 40mg  IV between transfusions to prevent volume overload and hyperkalemia - Check Hb post transfusion -Aranespand IV ironper nephro  CKD stage V -Creatinine 6.71 on admission. Elevated to 7today. Kcontinues to be increased at 5.9 today. - Underwent LUE AVF placement 3/6 without complication - No signs of volume overload or uremia Plan - Nephrology following, appreciate recs - Vascular consulted, appreciate recs - Increase Lokelma to 10g BID - Phoslo, aranesp, sodium bicarb per nephro - Recheck K this evening after blood transfusions  Hypertension - Blood pressurewell controlled on current regimen Plan -Continue amlodipine 10 mg dailyandlabetalolto200TID  Diabetes mellitus -Am glucose 118. Pt did not receive evening lantus because she her blood sugar was 119 and she was NPO for surgery. Her worsened renal function may be attributing to her decreased insulin requirement.  Plan - Lantus decreased to 5u qhs -SSI  Dispo: Anticipated discharge when potassium improves  Albeiro Trompeter, [name], MD 10/14/2018, 7:24 AM Pager: [phone]

## 2018-10-14 NOTE — Progress Notes (Signed)
OT Cancellation Note  Patient Details Name: Kathy Frank MRN: [id number] DOB: 1976-08-08   Cancelled Treatment:    Reason Eval/Treat Not Completed: Patient at procedure or test/ unavailable Pt is off the unit at this time.  Jeri Modena, OTR/L  Acute Rehabilitation Services Pager: [phone] Office: [phone] .  Jeri Modena 10/14/2018, 7:58 AM

## 2018-10-14 NOTE — Op Note (Signed)
OPERATIVE NOTE   PROCEDURE: 1. left first stage basilic vein transposition (brachiobasilic arteriovenous fistula) placement  PRE-OPERATIVE DIAGNOSIS: CKD  POST-OPERATIVE DIAGNOSIS: same  SURGEON: [name], MD  ASSISTANT(S): Leontine Locket, PA  ANESTHESIA: general  ESTIMATED BLOOD LOSS: <50 cc  FINDING(S): 1.  Basilic vein: 3 mm, acceptable 2.  Brachial artery: 3 mm, disease free 3.  Venous outflow: palpable thrill  4.  Radial flow: palpable radial pulse  SPECIMEN(S):  none  INDICATIONS:   Kathy Frank is a 43 y.o. female who presents with CKD and need for permanent hemodialysis access.  The patient is scheduled for left arm fistula versus graft.  On intra-operative duplex and on vein mapping she had a 3 mm left basilic vein at the antecubital fossa.  The patient is aware the risks include but are not limited to: bleeding, infection, steal syndrome, nerve damage, ischemic monomelic neuropathy, failure to mature, and need for additional procedures.  The patient is aware of the risks of the procedure and elects to proceed forward.   DESCRIPTION: After full informed written consent was obtained from the patient, the patient was brought back to the operating room and placed supine upon the operating table.  Prior to induction, the patient received IV antibiotics.   After obtaining adequate anesthesia, the patient was then prepped and draped in the standard fashion for a left arm access procedure.  I turned my attention first to identifying the patient's basilic vein and brachial artery.    Using SonoSite guidance, the location of these vessels were marked out on the skin.   I made a transverse incision at the level of the antecubitum and dissected through the subcutaneous tissue and fascia to gain exposure of the brachial artery.  This was noted to be 3 mm in diameter externally.  This was dissected out proximally and distally and controlled with vessel loops .  I  then dissected out the basilic vein.  This was noted to be 3 mm in diameter externally.  The distal segment of the vein was ligated with a  2-0 silk, and the vein was transected.  The proximal segment was interrogated with serial dilators.  The vein accepted up to a 3.5 mm dilator without any difficulty.  I then instilled the heparinized saline into the vein and clamped it.  At this point, I reset my exposure of the brachial artery and placed the artery under tension proximally and distally.  I made an arteriotomy with a #11 blade, and then I extended the arteriotomy with a Potts scissor.  I injected heparinized saline proximal and distal to this arteriotomy.  The vein was then sewn to the artery in an end-to-side configuration with a running stitch of 6-0 Prolene.  Prior to completing this anastomosis, I allowed the vein and artery to backbleed.  There was no evidence of clot from any vessels.  I completed the anastomosis in the usual fashion and then released all vessel loops and clamps.    There was a palpable thrill in the venous outflow, and there was a palpable radial pulse.  At this point, I irrigated out the surgical wound.  There was no further active bleeding.  The subcutaneous tissue was reapproximated with a running stitch of 3-0 Vicryl.  The skin was then reapproximated with a running subcuticular stitch of 4-0 Monocryl.  The skin was then cleaned, dried, and reinforced with Dermabond.  The patient tolerated this procedure well.    COMPLICATIONS: None  CONDITION:  Stable   [name] MD [facility] Office: [phone] Pager: [phone]  10/14/2018, 8:58 AM

## 2018-10-14 NOTE — Anesthesia Procedure Notes (Signed)
Procedure Name: LMA Insertion Date/Time: 10/14/2018 7:42 AM Performed by: Mariea Clonts, CRNA Pre-anesthesia Checklist: Patient identified, Emergency Drugs available, Suction available and Patient being monitored Patient Re-evaluated:Patient Re-evaluated prior to induction Oxygen Delivery Method: Circle System Utilized Preoxygenation: Pre-oxygenation with 100% oxygen Induction Type: IV induction Ventilation: Mask ventilation without difficulty LMA: LMA inserted LMA Size: 4.0 Number of attempts: 1 Airway Equipment and Method: Bite block Placement Confirmation: positive ETCO2 Tube secured with: Tape Dental Injury: Teeth and Oropharynx as per pre-operative assessment

## 2018-10-14 NOTE — Progress Notes (Signed)
Initial Nutrition Assessment  DOCUMENTATION CODES:   Not applicable  INTERVENTION:  Prostat BID Juven BID Provided Renal Education, teach back method used, expect fair compliance.  Recommend obtaining new weight to better assess patient.   NUTRITION DIAGNOSIS:   Increased nutrient needs related to post-op healing as evidenced by estimated needs.  GOAL:   Patient will meet greater than or equal to 90% of their needs  MONITOR:   PO intake, Supplement acceptance, Weight trends  REASON FOR ASSESSMENT:   Consult Diet education  ASSESSMENT:   43 year old female w/ PMH of CKD 5, HIV, depression, T2DM, HTN,  and GERD. Pt presented to ED on 2/28 for evaluation of a foot infection. Previous amputation of toes on left foot 2 years ago. Underwent left transtibial amputation on 2/29.   Spoke with pt at bedside who was very pleasant.  Initially consulted for renal diet education. Obtained diet recall and examined current intake for substitutions. Pt reports that she enjoys having frozen vegetables (bellpeppers, potatoes, broccoli), hamburger with cheese, [place] deli sandwiches, pineapple and green beans. Pt reports that she has a degree of lactose intolerance so she does not drink milk.    Provided patient many handouts ("Foods high in Na, K, Phos", "CKD Diet", "Low phosphorus diet", and "Low potassium diet"). Clarified to pt foods high in potassium, phosphorus, and sodium. Educated pt on the importance of monitoring her intake. Discussed with pt the need for increased protein intake when undergoing dialysis.   Pt was receptive and appreciative of education. Also spoke with pt regarding her increased need for protein to assist with healing her leg after the BKA. Pt reported that she struggles with meeting her protein needs and is willing to take PS BID and Juven BID. Pt had no further concerns or questions regarding her current diet order, diet education or protein intake.   Per chart  meal completion is at 100%. NFPE did reveal some depletions in her right leg; pt reports that she has not moved around much since her procedure. Pt does not have a recent wt documented, recommend obtaining a new wt to further assess patient.   Medications reviewed and include: Rocaltrol, Phoslo, Colace 100mg  2x, insulin aspart 0-15 units, insulin glargine 5 units, Sodium bicarb 1.3g 3x, Lokelma, IV NaCl 81ml/hr Labs reviewed: CBG (32,99,242,683), Na 133 (L), K 5.9 (H), corrected Ca 9.2, Phos 5.6 (H)  NUTRITION - FOCUSED PHYSICAL EXAM:    Most Recent Value  Orbital Region  No depletion  Upper Arm Region  No depletion  Thoracic and Lumbar Region  No depletion  Buccal Region  No depletion  Temple Region  No depletion  Clavicle Bone Region  No depletion  Clavicle and Acromion Bone Region  No depletion  Scapular Bone Region  No depletion  Dorsal Hand  No depletion  Patellar Region  Mild depletion [Right leg, Left BKA]  Anterior Thigh Region  Mild depletion  Posterior Calf Region  Mild depletion  Edema (RD Assessment)  Mild  Hair  Reviewed  Eyes  Reviewed  Mouth  Reviewed  Skin  Reviewed  Nails  Reviewed       Diet Order:   Diet Order            Diet renal with fluid restriction Fluid restriction: 1200 mL Fluid; Room service appropriate? Yes; Fluid consistency: Thin  Diet effective now              EDUCATION NEEDS:   Education needs have been addressed  Skin:  Skin Assessment: Skin Integrity Issues: Skin Integrity Issues:: Incisions, Wound VAC Wound Vac: Left thigh Incisions: Left thigh, left arm  Last BM:  3/5  Height:   Ht Readings from Last 1 Encounters:  10/06/18 5\' 5"  (1.651 m)    Weight:   Wt Readings from Last 1 Encounters:  10/06/18 72.6 kg    Ideal Body Weight:  53.1 kg (Adjusted BKA)  BMI:  Body mass index is 28.5 kg/m. (Adjusted BKA)  Estimated Nutritional Needs:   Kcal:  1700-1900 kcal  Protein:  95-110 g  Fluid:  1L + UOP    [employer]

## 2018-10-14 NOTE — Transfer of Care (Signed)
Immediate Anesthesia Transfer of Care Note  Patient: Kathy Frank  Procedure(s) Performed: Arteriovenous (Av) Fistula Creation Left Arm (Left Arm Upper)  Patient Location: PACU  Anesthesia Type:General  Level of Consciousness: awake, alert  and oriented  Airway & Oxygen Therapy: Patient Spontanous Breathing and Patient connected to nasal cannula oxygen  Post-op Assessment: Report given to RN, Post -op Vital signs reviewed and stable and Patient moving all extremities X 4  Post vital signs: Reviewed and stable  Last Vitals:  Vitals Value Taken Time  BP 142/79 10/14/2018  9:07 AM  Temp    Pulse 74 10/14/2018  9:09 AM  Resp 11 10/14/2018  9:09 AM  SpO2 92 % 10/14/2018  9:09 AM  Vitals shown include unvalidated device data.  Last Pain:  Vitals:   10/14/18 0535  TempSrc: Oral  PainSc:       Patients Stated Pain Goal: 0 (90/21/11 5520)  Complications: No apparent anesthesia complications

## 2018-10-15 ENCOUNTER — Encounter (HOSPITAL_COMMUNITY): Payer: Self-pay | Admitting: Vascular Surgery

## 2018-10-15 LAB — RENAL FUNCTION PANEL
Albumin: 3 g/dL — ABNORMAL LOW (ref 3.5–5.0)
Anion gap: 13 (ref 5–15)
BUN: 65 mg/dL — ABNORMAL HIGH (ref 6–20)
CO2: 20 mmol/L — ABNORMAL LOW (ref 22–32)
Calcium: 8.2 mg/dL — ABNORMAL LOW (ref 8.9–10.3)
Chloride: 99 mmol/L (ref 98–111)
Creatinine, Ser: 6.98 mg/dL — ABNORMAL HIGH (ref 0.44–1.00)
GFR calc Af Amer: 8 mL/min — ABNORMAL LOW (ref >60)
GFR calc non Af Amer: 7 mL/min — ABNORMAL LOW (ref >60)
Glucose, Bld: 125 mg/dL — ABNORMAL HIGH (ref 70–99)
Phosphorus: 4.3 mg/dL (ref 2.5–4.6)
Potassium: 5.7 mmol/L — ABNORMAL HIGH (ref 3.5–5.1)
Sodium: 132 mmol/L — ABNORMAL LOW (ref 135–145)

## 2018-10-15 LAB — CBC
HCT: 28.3 % — ABNORMAL LOW (ref 36.0–46.0)
Hemoglobin: 9.1 g/dL — ABNORMAL LOW (ref 12.0–15.0)
MCH: 29.2 pg (ref 26.0–34.0)
MCHC: 32.2 g/dL (ref 30.0–36.0)
MCV: 90.7 fL (ref 80.0–100.0)
Platelets: 328 10*3/uL (ref 150–400)
RBC: 3.12 MIL/uL — ABNORMAL LOW (ref 3.87–5.11)
RDW: 15.5 % (ref 11.5–15.5)
WBC: 9.4 10*3/uL (ref 4.0–10.5)
nRBC: 0 % (ref 0.0–0.2)

## 2018-10-15 LAB — GLUCOSE, CAPILLARY
Glucose-Capillary: 120 mg/dL — ABNORMAL HIGH (ref 70–99)
Glucose-Capillary: 128 mg/dL — ABNORMAL HIGH (ref 70–99)
Glucose-Capillary: 133 mg/dL — ABNORMAL HIGH (ref 70–99)

## 2018-10-15 MED ORDER — SODIUM ZIRCONIUM CYCLOSILICATE 10 G PO PACK
10.0000 g | PACK | Freq: Three times a day (TID) | ORAL | Status: DC
  Administered 2018-10-15 – 2018-10-16 (×3): 10 g via ORAL
  Filled 2018-10-15 (×3): qty 1

## 2018-10-15 MED ORDER — FUROSEMIDE 10 MG/ML IJ SOLN
INTRAMUSCULAR | Status: AC
  Administered 2018-10-15: 04:00:00
  Filled 2018-10-15: qty 4

## 2018-10-15 NOTE — Progress Notes (Signed)
[facility]  Subjective  - No complaints   Objective (!) 146/77 85 98.3 F (36.8 C) (Oral) 18 100%  Intake/Output Summary (Last 24 hours) at 10/15/2018 0913 Last data filed at 10/15/2018 0330 Gross per 24 hour  Intake 1260 ml  Output 0 ml  Net 1260 ml    Left radial pulse palpable at wrist Left basilic vein fistula with appreciable thrill - some mild swelling around incision, soft.  Laboratory Lab Results: Recent Labs    10/14/18 0216 10/15/18 0401  WBC 7.4 9.4  HGB 6.9* 9.1*  HCT 22.1* 28.3*  PLT 286 328   BMET Recent Labs    10/14/18 0216 10/15/18 0401  NA 133* 132*  K 5.9* 5.7*  CL 103 99  CO2 22 20*  GLUCOSE 119* 125*  BUN 50* 65*  CREATININE 7.08* 6.98*  CALCIUM 8.1* 8.2*    COAG Lab Results  Component Value Date   INR 1.02 08/11/2018   INR 1.19 07/02/2018   No results found for: PTT  Assessment/Planning: POD #1 s/p left brachiobasilic AVF.  Thrill in fistula.  No hand complaints.  Will arrange follow-up in vascular surgery clinic in one month with fistula duplex and if vein mature will arrange stage two basilic transposition at that time.  Call vascular with questions or concerns.  [name] 10/15/2018 9:13 AM --

## 2018-10-15 NOTE — Progress Notes (Signed)
   Subjective: The patient stated today that she is menstruating and continues to pass clots daily which is normal for her. She had not previously advised our team of such despite being asked if she was bleeding. She stated that the length of her cycle has been normal as well as for the heavy bleeding. She denied dyspnea, headache or fatigue.   Objective:  Vital signs in last 24 hours: Vitals:   10/14/18 2302 10/14/18 2355 10/15/18 0330 10/15/18 0432  BP: (!) 161/85 133/74 (!) 163/89 (!) 146/77  Pulse: 88 86 86 85  Resp:  18 18 18   Temp: 98.6 F (37 C) 97.9 F (36.6 C) 97.9 F (36.6 C) 98.3 F (36.8 C)  TempSrc: Oral Oral Oral Oral  SpO2: 98% 100% 98% 100%  Weight:      Height:       General: A/O x4, in no acute distress, afebrile, nondiaphoretic Cardio: RRR, no mrg's  Pulmonary: CTA bilaterally, no wheezing or crackles  Abdomen: Bowel sounds normal, soft, nontender  MSK: Right LE nontender, nonedematous, wound dressed not warm to touch Psych: Appropriate affect, not depressed in appearance, engages well  Assessment/Plan:  Principal Problem:   Osteomyelitis ([place]) Active Problems:   Diabetes mellitus type 2 in obese ([place])   Essential hypertension   Human immunodeficiency virus (HIV) disease ([place])   CKD (chronic kidney disease), stage V ([place])   Wound infection s/p L transmetatarsal amputation   Dehiscence of amputation stump ([place])   Moderate protein-calorie malnutrition ([place])  Kathy Frank is a 43-yr-old female past medical history notable for insulin-dependent diabetes, HTN, CKD stage V, and HIV with 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  She underwent transtibial amputation on 09/29/2018 for osteomyelitis and a nonhealing ulceration of the left distal foot.  He hospital course has been complicated by worsening renal function requiring the left AV fistula placement on 3/6 and persistent anemia requiring multiple  transfusions.  Osteomyelitis of the left foot: Status post 2 palpitation.  Pain well controlled, wound appears to be healing as per orthopedic follow-up notes.  Wound VAC discontinued now. Continue pain control with p.o. oxycodone 10 mg every 4 hours as needed Continue gabapentin 30 mg 3 times daily Continue Robaxin 6 every 6 hours as needed for muscle spasms Outpatient follow-up with Ortho in 1 week following discharge Likely home tomorrow if her CBC remains stable CBC in am  Anemia: Felt secondary to acute blood loss and end-stage renal disease.  Patient is requiring repeat transfusions totaling 6 units of PRBCs to date.  Hemoglobin this morning improved to 9.1 following 2 unit transfusion again on 3/6. --Continue Aranesp and IV iron per nephrology --Renal panel in am  CKD stage V: AV fistula placement completed by vascular surgeries Dr. Carlis Abbott.  Patient does not wish to have hemodialysis catheter placed at this time.  Nephrology recommended continue to monitor the patient's potassium.  Would likely consider discharge home tomorrow if her hemoglobin potassium remained stable. Continue Lokelma, twice daily, will likely need to switch to a more affordable option such as kayexalate on discharge.  BMP in a.m. Continue PhosLo, Aranesp, sodium bicarb per nephro  DMII: Glucose stable.  Continue Lantus 5 units nightly Continue SSI.  Diet: Renal carb modified Fluids: N/A Code: Full DVT PBX Heparin Dispo: Anticipated discharge in approximately however many day(s).   Kathy Ludwig, MD 10/15/2018, 7:16 AM Pager: Pager# [phone]

## 2018-10-15 NOTE — Progress Notes (Signed)
[facility] NEPHROLOGY PROGRESS NOTE  Assessment/ Plan: Pt is a 43 y.o. yo female with Frank controlled Frank, Kathy Frank, Kathy Frank, Kathy Frank,Kathy Frank,  Kathy Frank controlled Frank and Kathy Frank: Serum creatinine level stable around 6.6-7 associated with mild hyperkalemia.  She has no fluid overload and not uremic on exam.   -Status post left first stage basilic vein transposition, brachiobasilic AV fistula by Dr. Carlis Abbott on 10/14/2018. - Patient does not want to have catheter placed for dialysis.  Hopefully she can wait until fistula matures to start dialysis.  No urgent indication for dialysis today except mild hyperkalemia. Continue sodium bicarbonate.  Continue to monitor renal function.  She will need to follow with Dr. Posey Pronto at [facility] in 1-2 weeks after discharge from the hospital.  #Hyperkalemia:  -Seen by dietitian for restricting high potassium diet.  Potassium 5.7 today.  Increase Lokelma to three times a day. Repeat lab in the morning.  #Anemia of Kathy: Acute worsening likely due to acute blood loss.  Received 2 units of blood transfusion on 3/6. Iron saturation 6%, continue IV iron and continue ESA.   #Secondary hyperparathyroidism: Continue PhosLo, monitor phosphorus and calcium level.  PTH level 252, on calcitriol.  #Kathy status post left BKA: Per Ortho.  Wound VAC discontinued  #Kathy on medication, CD4 count 150 in May 2019  #Kathy Frank: Monitor blood pressure, continue current antihypertensive medication.  Subjective: Seen and examined at bedside.  No new event.  Denied headache, dizziness, nausea vomiting. Objective Vital signs in last 24 hours: Vitals:   10/14/18 2355 10/15/18 0330 10/15/18 0432 10/15/18 0927  BP: 133/74 (!) 163/89 (!) 146/77 (!) 166/92  Pulse: 86 86 85 83  Resp: 18 18  18 18   Temp: 97.9 F (36.6 C) 97.9 F (36.6 C) 98.3 F (36.8 C) 98.4 F (36.9 C)  TempSrc: Oral Oral Oral Oral  SpO2: 100% 98% 100% 98%  Weight:      Height:       Weight change:   Intake/Output Summary (Last 24 hours) at 10/15/2018 1211 Last data filed at 10/15/2018 1100 Gross per 24 hour  Intake 1100 ml  Output -  Net 1100 ml       Labs: Basic Metabolic Panel: Recent Labs  Lab 10/13/18 0318 10/14/18 0216 10/15/18 0401  NA 135 133* 132*  K 5.3* 5.9* 5.7*  CL 102 103 99  CO2 22 22 20*  GLUCOSE 93 119* 125*  BUN 45* 50* 65*  CREATININE 7.12* 7.08* 6.98*  CALCIUM 7.9* 8.1* 8.2*  PHOS 6.2* 5.6* 4.3   Liver Function Tests: Recent Labs  Lab 10/13/18 0318 10/14/18 0216 10/15/18 0401  ALBUMIN 2.5* 2.6* 3.0*   No results for input(s): LIPASE, AMYLASE in the last 168 hours. No results for input(s): AMMONIA in the last 168 hours. CBC: Recent Labs  Lab 10/11/18 0748 10/12/18 0513 10/13/18 0318 10/14/18 0216 10/15/18 0401  WBC 7.8 6.8 6.0 7.4 9.4  HGB 7.1* 7.5* 7.1* 6.9* 9.1*  HCT 22.2* 23.3* 21.8* 22.1* 28.3*  MCV 87.7 89.6 89.0 90.9 90.7  PLT 194 247 258 286 328   Cardiac Enzymes: No results for input(s): CKTOTAL, CKMB, CKMBINDEX, TROPONINI in the last 168 hours. CBG: Recent Labs  Lab 10/13/18 2220 10/14/18 0909 10/14/18 1156 10/14/18 1616 10/14/18 2110  GLUCAP 151* 118* 236* 343* 149*  Iron Studies:  No results for input(s): IRON, TIBC, TRANSFERRIN, FERRITIN in the last 72 hours. Studies/Results: No results found.  Medications: Infusions: . sodium chloride Stopped (10/14/18 1816)  . sodium chloride 10 mL/hr at 10/09/18 1400  . methocarbamol (ROBAXIN) IV Stopped (10/09/18 6433)    Scheduled Medications: . amLODipine  10 mg Oral Daily  . calcitRIOL  0.25 mcg Oral Daily  . calcium acetate  1,334 mg Oral TID WC  . citalopram  20 mg Oral Daily  . darbepoetin (ARANESP) injection - NON-DIALYSIS  60 mcg Subcutaneous Q Tue-1800  . docusate  sodium  100 mg Oral BID  . dolutegravir  50 mg Oral Q breakfast   And  . rilpivirine  25 mg Oral Q breakfast  . feeding supplement (PRO-STAT SUGAR FREE 64)  30 mL Oral BID  . gabapentin  300 mg Oral Daily  . heparin  5,000 Units Subcutaneous Q8H  . insulin aspart  0-15 Units Subcutaneous TID WC  . insulin glargine  5 Units Subcutaneous Q2200  . labetalol  200 mg Oral TID  . nutrition supplement (JUVEN)  1 packet Oral BID BM  . sodium bicarbonate  1,300 mg Oral TID  . sodium zirconium cyclosilicate  10 g Oral TID    have reviewed scheduled and prn medications.  Physical Exam: General: Distress, able to lie flat Heart:RRR, s1s2 nl, no rubs Lungs: Clear bilateral, no wheezing or crackle Abdomen:soft, Non-tender, non-distended Extremities: No lower extremity edema Neurology: Alert awake, no asterixis  Miosha Behe Tanna Furry 10/15/2018,12:11 PM  LOS: 8 days

## 2018-10-16 ENCOUNTER — Emergency Department (HOSPITAL_COMMUNITY): Payer: Medicaid Other

## 2018-10-16 ENCOUNTER — Inpatient Hospital Stay (HOSPITAL_COMMUNITY)
Admission: EM | Admit: 2018-10-16 | Discharge: 2018-10-21 | Disposition: A | Discharge status: Home / Self Care | Payer: Medicaid Other | Source: Home / Self Care | Attending: Oncology | Admitting: Oncology

## 2018-10-16 ENCOUNTER — Other Ambulatory Visit: Payer: Self-pay

## 2018-10-16 ENCOUNTER — Encounter (HOSPITAL_COMMUNITY): Payer: Self-pay | Admitting: Emergency Medicine

## 2018-10-16 DIAGNOSIS — N186 End stage renal disease: Secondary | ICD-10-CM | POA: Diagnosis present

## 2018-10-16 DIAGNOSIS — B20 Human immunodeficiency virus [HIV] disease: Secondary | ICD-10-CM | POA: Diagnosis present

## 2018-10-16 DIAGNOSIS — E669 Obesity, unspecified: Secondary | ICD-10-CM | POA: Diagnosis present

## 2018-10-16 DIAGNOSIS — N185 Chronic kidney disease, stage 5: Secondary | ICD-10-CM

## 2018-10-16 DIAGNOSIS — T879 Unspecified complications of amputation stump: Secondary | ICD-10-CM | POA: Diagnosis present

## 2018-10-16 DIAGNOSIS — I10 Essential (primary) hypertension: Secondary | ICD-10-CM | POA: Diagnosis present

## 2018-10-16 DIAGNOSIS — E1169 Type 2 diabetes mellitus with other specified complication: Secondary | ICD-10-CM | POA: Diagnosis present

## 2018-10-16 DIAGNOSIS — E119 Type 2 diabetes mellitus without complications: Secondary | ICD-10-CM | POA: Diagnosis present

## 2018-10-16 LAB — BPAM RBC
Blood Product Expiration Date: 202003112359
Blood Product Expiration Date: 202004012359
ISSUE DATE / TIME: 202003061635
ISSUE DATE / TIME: 202003062240
Unit Type and Rh: 1700
Unit Type and Rh: 7300

## 2018-10-16 LAB — CBC WITH DIFFERENTIAL/PLATELET
Abs Immature Granulocytes: 0.04 10*3/uL (ref 0.00–0.07)
Basophils Absolute: 0 10*3/uL (ref 0.0–0.1)
Basophils Relative: 0 %
Eosinophils Absolute: 0.6 10*3/uL — ABNORMAL HIGH (ref 0.0–0.5)
Eosinophils Relative: 7 %
HCT: 26.3 % — ABNORMAL LOW (ref 36.0–46.0)
Hemoglobin: 8.3 g/dL — ABNORMAL LOW (ref 12.0–15.0)
Immature Granulocytes: 0 %
Lymphocytes Relative: 14 %
Lymphs Abs: 1.3 10*3/uL (ref 0.7–4.0)
MCH: 29.9 pg (ref 26.0–34.0)
MCHC: 31.6 g/dL (ref 30.0–36.0)
MCV: 94.6 fL (ref 80.0–100.0)
Monocytes Absolute: 0.8 10*3/uL (ref 0.1–1.0)
Monocytes Relative: 8 %
Neutro Abs: 6.6 10*3/uL (ref 1.7–7.7)
Neutrophils Relative %: 71 %
Platelets: 354 10*3/uL (ref 150–400)
RBC: 2.78 MIL/uL — ABNORMAL LOW (ref 3.87–5.11)
RDW: 15.8 % — ABNORMAL HIGH (ref 11.5–15.5)
WBC: 9.3 10*3/uL (ref 4.0–10.5)
nRBC: 0 % (ref 0.0–0.2)

## 2018-10-16 LAB — RENAL FUNCTION PANEL
Albumin: 2.4 g/dL — ABNORMAL LOW (ref 3.5–5.0)
Anion gap: 9 (ref 5–15)
BUN: 78 mg/dL — ABNORMAL HIGH (ref 6–20)
CO2: 23 mmol/L (ref 22–32)
Calcium: 8.1 mg/dL — ABNORMAL LOW (ref 8.9–10.3)
Chloride: 103 mmol/L (ref 98–111)
Creatinine, Ser: 6.94 mg/dL — ABNORMAL HIGH (ref 0.44–1.00)
GFR calc Af Amer: 8 mL/min — ABNORMAL LOW (ref >60)
GFR calc non Af Amer: 7 mL/min — ABNORMAL LOW (ref >60)
Glucose, Bld: 103 mg/dL — ABNORMAL HIGH (ref 70–99)
Phosphorus: 4.6 mg/dL (ref 2.5–4.6)
Potassium: 5.1 mmol/L (ref 3.5–5.1)
Sodium: 135 mmol/L (ref 135–145)

## 2018-10-16 LAB — CBC
HCT: 27.9 % — ABNORMAL LOW (ref 36.0–46.0)
Hemoglobin: 8.8 g/dL — ABNORMAL LOW (ref 12.0–15.0)
MCH: 28.9 pg (ref 26.0–34.0)
MCHC: 31.5 g/dL (ref 30.0–36.0)
MCV: 91.8 fL (ref 80.0–100.0)
Platelets: 312 10*3/uL (ref 150–400)
RBC: 3.04 MIL/uL — ABNORMAL LOW (ref 3.87–5.11)
RDW: 15.7 % — ABNORMAL HIGH (ref 11.5–15.5)
WBC: 7 10*3/uL (ref 4.0–10.5)
nRBC: 0 % (ref 0.0–0.2)

## 2018-10-16 LAB — I-STAT BETA HCG BLOOD, ED (MC, WL, AP ONLY): I-stat hCG, quantitative: 8.7 m[IU]/mL — ABNORMAL HIGH (ref <5)

## 2018-10-16 LAB — TYPE AND SCREEN
ABO/RH(D): B POS
Antibody Screen: NEGATIVE
Unit division: 0
Unit division: 0

## 2018-10-16 LAB — COMPREHENSIVE METABOLIC PANEL
ALT: 6 U/L (ref 0–44)
AST: 15 U/L (ref 15–41)
Albumin: 2.7 g/dL — ABNORMAL LOW (ref 3.5–5.0)
Alkaline Phosphatase: 79 U/L (ref 38–126)
Anion gap: 11 (ref 5–15)
BUN: 89 mg/dL — ABNORMAL HIGH (ref 6–20)
CO2: 23 mmol/L (ref 22–32)
Calcium: 8.4 mg/dL — ABNORMAL LOW (ref 8.9–10.3)
Chloride: 101 mmol/L (ref 98–111)
Creatinine, Ser: 7.19 mg/dL — ABNORMAL HIGH (ref 0.44–1.00)
GFR calc Af Amer: 7 mL/min — ABNORMAL LOW (ref >60)
GFR calc non Af Amer: 6 mL/min — ABNORMAL LOW (ref >60)
Glucose, Bld: 97 mg/dL (ref 70–99)
Potassium: 5.8 mmol/L — ABNORMAL HIGH (ref 3.5–5.1)
Sodium: 135 mmol/L (ref 135–145)
Total Bilirubin: 0.5 mg/dL (ref 0.3–1.2)
Total Protein: 7 g/dL (ref 6.5–8.1)

## 2018-10-16 LAB — GLUCOSE, CAPILLARY
Glucose-Capillary: 100 mg/dL — ABNORMAL HIGH (ref 70–99)
Glucose-Capillary: 84 mg/dL (ref 70–99)
Glucose-Capillary: 107 mg/dL — ABNORMAL HIGH (ref 70–99)
Glucose-Capillary: 123 mg/dL — ABNORMAL HIGH (ref 70–99)

## 2018-10-16 MED ORDER — OXYCODONE HCL 5 MG PO TABS: 5.0000 mg | ORAL_TABLET | Freq: Four times a day (QID) | ORAL | 0 refills | Status: DC | PRN

## 2018-10-16 MED ORDER — GABAPENTIN 300 MG PO CAPS: 300.0000 mg | ORAL_CAPSULE | Freq: Every day | ORAL | 0 refills | Status: DC

## 2018-10-16 MED ORDER — CALCIUM ACETATE (PHOS BINDER) 667 MG PO CAPS: 1334.0000 mg | ORAL_CAPSULE | Freq: Three times a day (TID) | ORAL | 2 refills | Status: DC

## 2018-10-16 MED ORDER — CALCITRIOL 0.25 MCG PO CAPS: 0.2500 ug | ORAL_CAPSULE | Freq: Every day | ORAL | 1 refills | Status: DC

## 2018-10-16 MED ORDER — DOLUTEGRAVIR SODIUM 50 MG PO TABS
50.0000 mg | ORAL_TABLET | Freq: Every day | ORAL | Status: DC
  Administered 2018-10-17 – 2018-10-21 (×4): 50 mg via ORAL
  Filled 2018-10-16 (×5): qty 1

## 2018-10-16 MED ORDER — LABETALOL HCL 100 MG PO TABS
100.0000 mg | ORAL_TABLET | Freq: Two times a day (BID) | ORAL | Status: DC
  Administered 2018-10-17 – 2018-10-19 (×6): 100 mg via ORAL
  Filled 2018-10-16: qty 1
  Filled 2018-10-16 (×2): qty 0.5
  Filled 2018-10-16: qty 1
  Filled 2018-10-16 (×3): qty 0.5

## 2018-10-16 MED ORDER — CALCITRIOL 0.25 MCG PO CAPS
0.2500 ug | ORAL_CAPSULE | Freq: Every day | ORAL | Status: DC
  Administered 2018-10-17 – 2018-10-21 (×4): 0.25 ug via ORAL
  Filled 2018-10-16 (×4): qty 1

## 2018-10-16 MED ORDER — SODIUM ZIRCONIUM CYCLOSILICATE 10 G PO PACK: 10.0000 g | PACK | Freq: Three times a day (TID) | ORAL | 0 refills | Status: DC

## 2018-10-16 MED ORDER — ONDANSETRON HCL 4 MG/2ML IJ SOLN: 4.0000 mg | Freq: Four times a day (QID) | INTRAMUSCULAR | Status: DC | PRN

## 2018-10-16 MED ORDER — METHOCARBAMOL 500 MG PO TABS: 500.0000 mg | ORAL_TABLET | Freq: Four times a day (QID) | ORAL | 0 refills | Status: DC | PRN

## 2018-10-16 MED ORDER — DOLUTEGRAVIR-RILPIVIRINE 50-25 MG PO TABS: 1.0000 | ORAL_TABLET | Freq: Every day | ORAL | Status: DC

## 2018-10-16 MED ORDER — RILPIVIRINE HCL 25 MG PO TABS
25.0000 mg | ORAL_TABLET | Freq: Every day | ORAL | Status: DC
  Administered 2018-10-17 – 2018-10-21 (×4): 25 mg via ORAL
  Filled 2018-10-16 (×5): qty 1

## 2018-10-16 MED ORDER — FENTANYL CITRATE (PF) 100 MCG/2ML IJ SOLN
50.0000 ug | INTRAMUSCULAR | Status: DC | PRN
  Administered 2018-10-16 – 2018-10-17 (×3): 50 ug via INTRAVENOUS
  Filled 2018-10-16 (×2): qty 2

## 2018-10-16 MED ORDER — DARBEPOETIN ALFA 60 MCG/0.3ML IJ SOSY: 60.0000 ug | PREFILLED_SYRINGE | INTRAMUSCULAR | 0 refills | Status: DC

## 2018-10-16 MED ORDER — POLYETHYLENE GLYCOL 3350 17 G PO PACK
17.0000 g | PACK | Freq: Every day | ORAL | Status: DC
  Filled 2018-10-16 (×4): qty 1

## 2018-10-16 MED ORDER — CALCIUM ACETATE (PHOS BINDER) 667 MG PO CAPS
1334.0000 mg | ORAL_CAPSULE | Freq: Three times a day (TID) | ORAL | Status: DC
  Administered 2018-10-17 – 2018-10-21 (×12): 1334 mg via ORAL
  Filled 2018-10-16 (×12): qty 2

## 2018-10-16 MED ORDER — SENNA 8.6 MG PO TABS
2.0000 | ORAL_TABLET | Freq: Two times a day (BID) | ORAL | Status: DC
  Administered 2018-10-17 – 2018-10-18 (×2): 17.2 mg via ORAL
  Filled 2018-10-16 (×8): qty 2

## 2018-10-16 MED ORDER — SODIUM BICARBONATE 650 MG PO TABS
1300.0000 mg | ORAL_TABLET | Freq: Two times a day (BID) | ORAL | Status: DC
  Administered 2018-10-17 – 2018-10-21 (×8): 1300 mg via ORAL
  Filled 2018-10-16 (×9): qty 2

## 2018-10-16 MED ORDER — METHOCARBAMOL 500 MG PO TABS
500.0000 mg | ORAL_TABLET | Freq: Four times a day (QID) | ORAL | Status: DC | PRN
  Administered 2018-10-17 – 2018-10-19 (×6): 500 mg via ORAL
  Filled 2018-10-16 (×6): qty 1

## 2018-10-16 MED ORDER — ONDANSETRON HCL 4 MG PO TABS: 4.0000 mg | ORAL_TABLET | Freq: Four times a day (QID) | ORAL | Status: DC | PRN

## 2018-10-16 MED ORDER — FENTANYL CITRATE (PF) 100 MCG/2ML IJ SOLN
INTRAMUSCULAR | Status: AC
  Administered 2018-10-16: 50 ug via INTRAVENOUS
  Filled 2018-10-16: qty 2

## 2018-10-16 MED ORDER — FENTANYL CITRATE (PF) 100 MCG/2ML IJ SOLN
50.0000 ug | Freq: Once | INTRAMUSCULAR | Status: AC
  Administered 2018-10-16: 50 ug via INTRAVENOUS

## 2018-10-16 MED ORDER — SODIUM POLYSTYRENE SULFONATE 15 GM/60ML PO SUSP
15.0000 g | Freq: Three times a day (TID) | ORAL | Status: DC
  Filled 2018-10-16 (×2): qty 60

## 2018-10-16 MED ORDER — HYDROMORPHONE HCL 1 MG/ML IJ SOLN
1.0000 mg | Freq: Once | INTRAMUSCULAR | Status: AC
  Administered 2018-10-16: 1 mg via INTRAVENOUS
  Filled 2018-10-16: qty 1

## 2018-10-16 MED ORDER — FENTANYL CITRATE (PF) 100 MCG/2ML IJ SOLN
INTRAMUSCULAR | Status: AC
  Filled 2018-10-16: qty 2

## 2018-10-16 MED ORDER — HYDROMORPHONE HCL 1 MG/ML IJ SOLN
1.0000 mg | INTRAMUSCULAR | Status: DC | PRN
  Administered 2018-10-16 – 2018-10-19 (×11): 1 mg via INTRAVENOUS
  Filled 2018-10-16 (×11): qty 1

## 2018-10-16 MED ORDER — GABAPENTIN 300 MG PO CAPS
300.0000 mg | ORAL_CAPSULE | Freq: Every day | ORAL | Status: DC
  Administered 2018-10-17 – 2018-10-21 (×4): 300 mg via ORAL
  Filled 2018-10-16 (×4): qty 1

## 2018-10-16 MED ORDER — AMLODIPINE BESYLATE 10 MG PO TABS
10.0000 mg | ORAL_TABLET | Freq: Every day | ORAL | Status: DC
  Administered 2018-10-17 – 2018-10-21 (×4): 10 mg via ORAL
  Filled 2018-10-16: qty 1
  Filled 2018-10-16: qty 2
  Filled 2018-10-16: qty 1
  Filled 2018-10-16: qty 2

## 2018-10-16 MED ORDER — INSULIN GLARGINE 100 UNIT/ML SOLOSTAR PEN: 5.0000 [IU] | PEN_INJECTOR | Freq: Every day | SUBCUTANEOUS | 0 refills | Status: DC

## 2018-10-16 MED ORDER — CITALOPRAM HYDROBROMIDE 20 MG PO TABS
20.0000 mg | ORAL_TABLET | Freq: Every day | ORAL | Status: DC
  Administered 2018-10-17 – 2018-10-21 (×4): 20 mg via ORAL
  Filled 2018-10-16 (×2): qty 1
  Filled 2018-10-16 (×2): qty 2

## 2018-10-16 MED ORDER — SODIUM BICARBONATE 650 MG PO TABS: 1300.0000 mg | ORAL_TABLET | Freq: Two times a day (BID) | ORAL | 2 refills | Status: DC

## 2018-10-16 MED ORDER — SULFAMETHOXAZOLE-TRIMETHOPRIM 800-160 MG PO TABS: 1.0000 | ORAL_TABLET | Freq: Two times a day (BID) | ORAL | Status: DC

## 2018-10-16 MED ORDER — SODIUM ZIRCONIUM CYCLOSILICATE 10 G PO PACK
10.0000 g | PACK | Freq: Three times a day (TID) | ORAL | Status: DC
  Administered 2018-10-16 – 2018-10-18 (×4): 10 g via ORAL
  Filled 2018-10-16 (×5): qty 1

## 2018-10-16 MED ORDER — JUVEN PO PACK: 1.0000 | PACK | Freq: Two times a day (BID) | ORAL | 3 refills | Status: DC

## 2018-10-16 NOTE — Progress Notes (Signed)
[facility] NEPHROLOGY PROGRESS NOTE  Assessment/ Plan: Pt is a 43 y.o. yo female with poorly controlled diabetes, hypertension, HIV CD4 count 450, CKD stage V followed by Dr. 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  #CKD stage V due to poorly controlled diabetes and hypertension: Serum creatinine level stable around 6.6-7 associated with mild hyperkalemia.  She has no fluid overload and not uremic on exam.   -Status post left first stage basilic vein transposition, brachiobasilic AV fistula by Dr. Carlis Abbott on 10/14/2018. - Patient does not want to have catheter placed for dialysis.  Hopefully she can wait until fistula matures to start dialysis.  No urgent indication for dialysis at this time except mild hyperkalemia. Continue sodium bicarbonate.  Continue to monitor renal function.  She will need to follow with Dr. Posey Pronto at [facility] in 1-2 weeks after discharge from the hospital.  Patient said she will call the office tomorrow to set up the appointment.  #Hyperkalemia:  -Seen by dietitian for restricting high potassium diet.  Potassium 5.1 today.  I have discussed foods to avoid which are high in potassium including bananas, baked potato, tomatoes, spinach, avocado.  She received to [facility] and change to Kayexalate by primary team on discharge because of cost issue.  #Anemia of CKD: Acute worsening likely due to acute blood loss.  Received 2 units of blood transfusion on 3/6. Iron saturation 6%, received IV iron and  ESA.   #Secondary hyperparathyroidism: Continue PhosLo, monitor phosphorus and calcium level.  PTH level 252, on calcitriol.  #PAD status post left BKA: Per Ortho.  Wound VAC discontinued  #HIV on medication, CD4 count 150 in May 2019  #Hypertension: Monitor blood pressure, continue current antihypertensive medication.  Subjective: Seen and examined at bedside.  Ready to go home  today.  Denied headache, dizziness, nausea, vomiting, chest pain, shortness of breath.  Objective Vital signs in last 24 hours: Vitals:   10/15/18 1925 10/15/18 2228 10/16/18 0559 10/16/18 0950  BP: (!) 131/95 (!) 142/75 (!) 156/89 (!) 144/86  Pulse: 82 83 83   Resp: 15  14   Temp: 98.4 F (36.9 C)  97.8 F (36.6 C)   TempSrc:   Oral   SpO2: 97%  100%   Weight:      Height:       Weight change:   Intake/Output Summary (Last 24 hours) at 10/16/2018 1327 Last data filed at 10/16/2018 9371 Gross per 24 hour  Intake 960 ml  Output 850 ml  Net 110 ml       Labs: Basic Metabolic Panel: Recent Labs  Lab 10/14/18 0216 10/15/18 0401 10/16/18 0549  NA 133* 132* 135  K 5.9* 5.7* 5.1  CL 103 99 103  CO2 22 20* 23  GLUCOSE 119* 125* 103*  BUN 50* 65* 78*  CREATININE 7.08* 6.98* 6.94*  CALCIUM 8.1* 8.2* 8.1*  PHOS 5.6* 4.3 4.6   Liver Function Tests: Recent Labs  Lab 10/14/18 0216 10/15/18 0401 10/16/18 0549  ALBUMIN 2.6* 3.0* 2.4*   No results for input(s): LIPASE, AMYLASE in the last 168 hours. No results for input(s): AMMONIA in the last 168 hours. CBC: Recent Labs  Lab 10/12/18 0513 10/13/18 0318 10/14/18 0216 10/15/18 0401 10/16/18 0549  WBC 6.8 6.0 7.4 9.4 7.0  HGB 7.5* 7.1* 6.9* 9.1* 8.8*  HCT 23.3* 21.8* 22.1* 28.3* 27.9*  MCV 89.6 89.0 90.9 90.7 91.8  PLT 247  258 286 328 312   Cardiac Enzymes: No results for input(s): CKTOTAL, CKMB, CKMBINDEX, TROPONINI in the last 168 hours. CBG: Recent Labs  Lab 10/15/18 1635 10/15/18 2110 10/16/18 0657 10/16/18 0810 10/16/18 1153  GLUCAP 128* 133* 100* 84 107*    Iron Studies:  No results for input(s): IRON, TIBC, TRANSFERRIN, FERRITIN in the last 72 hours. Studies/Results: No results found.  Medications: Infusions: . sodium chloride Stopped (10/14/18 1816)  . sodium chloride 10 mL/hr at 10/09/18 1400  . methocarbamol (ROBAXIN) IV Stopped (10/09/18 0254)    Scheduled Medications: . amLODipine   10 mg Oral Daily  . calcitRIOL  0.25 mcg Oral Daily  . calcium acetate  1,334 mg Oral TID WC  . citalopram  20 mg Oral Daily  . darbepoetin (ARANESP) injection - NON-DIALYSIS  60 mcg Subcutaneous Q Tue-1800  . docusate sodium  100 mg Oral BID  . dolutegravir  50 mg Oral Q breakfast   And  . rilpivirine  25 mg Oral Q breakfast  . feeding supplement (PRO-STAT SUGAR FREE 64)  30 mL Oral BID  . gabapentin  300 mg Oral Daily  . heparin  5,000 Units Subcutaneous Q8H  . insulin aspart  0-15 Units Subcutaneous TID WC  . insulin glargine  5 Units Subcutaneous Q2200  . labetalol  200 mg Oral TID  . nutrition supplement (JUVEN)  1 packet Oral BID BM  . sodium bicarbonate  1,300 mg Oral TID  . sodium polystyrene  15 g Oral TID    have reviewed scheduled and prn medications.  Physical Exam: General: Able to lie flat, not in distress Heart:RRR, s1s2 nl, no rubs Lungs: Clear bilateral, no wheezing or crackle Abdomen:soft, Non-tender, non-distended Extremities: No edema Neurology: Alert awake, no asterixis Vascular: Left AV fistula site has good thrill and bruit, no discharge.  Ziomara Birenbaum Prasad Adekunle Rohrbach 10/16/2018,1:27 PM  LOS: 9 days

## 2018-10-16 NOTE — Progress Notes (Signed)
Equipment delivered. 3 in 1, RW, wheelchair. Renal diet reinforced, pt verbalized understanding. Education and demonstration return with dressing change. IV removed. AVS d/c instructions given. No further questions.

## 2018-10-16 NOTE — Progress Notes (Signed)
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  A walker alone will not resolve the issues with performing activities of daily living. A wheelchair will allow patient to safely perform daily activities.  The patient can self propel in the home or has a caregiver who can provide assistance.     Roney Marion, [place]  Acute Rehabilitation Services Pager [phone] Office [phone]

## 2018-10-16 NOTE — Progress Notes (Signed)
Paged to bedside by RN to evaluate the patient left AVF site. Her incision is open without purulent drainage, bleeding, and there is a palpable thrill. Bandage applied. Will contact vascular in the AM.

## 2018-10-16 NOTE — H&P (Signed)
Date: 10/16/2018               Patient Name:  Kathy Frank MRN: [id number]  DOB: 12-Jan-1976 Age / Sex: 43 y.o., female   PCP: Lars Mage, MD         Medical Service: Internal Medicine Teaching Service         Attending Physician: Dr. Annia Belt, MD    First Contact: Dr. Corinne Ports, MD Pager: [phone]  Second Contact: Dr. [name], MD Pager: [phone]       After Hours (After 5p/  First Contact Pager: [phone]  weekends / holidays): Second Contact Pager: [phone]   Chief Complaint: Fall  History of Present Illness: Kathy Frank is a 43 y.o. 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 She states that she was using her walker to ambulate and got twisted up and subsequently fell striking her elbow and her left below the knee amputation surgical site. She felt severe pain and noticed a significant amount of blood. She then came to the ED.   She denies any chest pain, SHOB, pre-/syncope, LOC. She was feeling well after discharge earlier in the day.   Meds:  No current facility-administered medications on file prior to encounter.    Current Outpatient Medications on File Prior to Encounter  Medication Sig Dispense Refill  . amLODipine (NORVASC) 10 MG tablet Take 1 tablet (10 mg total) by mouth daily. 30 tablet 2  . [START ON 10/17/2018] calcitRIOL (ROCALTROL) 0.25 MCG capsule Take 1 capsule (0.25 mcg total) by mouth daily. 30 capsule 1  . calcium acetate (PHOSLO) 667 MG capsule Take 2 capsules (1,334 mg total) by mouth 3 (three) times daily with meals. 180 capsule 2  . citalopram (CELEXA) 20 MG tablet Take 1 tablet (20 mg total) by mouth daily. 30 tablet 0  . [START ON 10/18/2018] Darbepoetin Alfa (ARANESP) 60 MCG/0.3ML SOSY injection Inject 0.3 mLs (60 mcg total) into the skin every Tuesday at 6 PM. 4.2 mL 0  .  Dolutegravir-Rilpivirine (JULUCA) 50-25 MG TABS Take 1 tablet by mouth daily. Please take with largest meal of the day. 30 tablet 2  . [START ON 10/17/2018] gabapentin (NEURONTIN) 300 MG capsule Take 1 capsule (300 mg total) by mouth daily. 90 capsule 0  . glucose blood (ACCU-CHEK GUIDE) test strip USE TO TEST BLOOD SUGAR 3 TIMES DAILY . Diagnosis code  E11.69, E66.9 100 each 11  . Insulin Glargine (LANTUS) 100 UNIT/ML Solostar Pen Inject 5 Units into the skin at bedtime. 3 mL 0  . labetalol (NORMODYNE) 100 MG tablet TAKE 1 TABLET BY MOUTH 2 TIMES DAILY (Patient taking differently: Take 100 mg by mouth 2 (two) times daily. ) 180 tablet 0  . liraglutide (VICTOZA) 18 MG/3ML SOPN Inject 0.2 mLs (1.2 mg total) into the skin daily. 3 mL 1  . methocarbamol (ROBAXIN) 500 MG tablet Take 1 tablet (500 mg total) by mouth every 6 (six) hours as needed for muscle spasms. 30 tablet 0  . nutrition supplement, JUVEN, (JUVEN) PACK Take 1 packet by mouth 2 (two) times daily between meals. 60 packet 3  . ondansetron (ZOFRAN) 4 MG tablet Take 1 tablet (4 mg total) by mouth daily as needed for nausea or vomiting. 30 tablet 0  . oxyCODONE (OXY IR/ROXICODONE) 5 MG immediate release tablet Take 1 tablet (5  mg total) by mouth every 6 (six) hours as needed for up to 5 days for moderate pain or severe pain. 20 tablet 0  . polyethylene glycol (MIRALAX / GLYCOLAX) packet Take 17 g by mouth daily. (Patient taking differently: Take 17 g by mouth daily as needed for mild constipation. ) 14 each 0  . senna (SENOKOT) 8.6 MG TABS tablet Take 2 tablets (17.2 mg total) by mouth 2 (two) times daily. (Patient taking differently: Take 2 tablets by mouth daily as needed for mild constipation. ) 30 each 0  . sodium bicarbonate 650 MG tablet Take 2 tablets (1,300 mg total) by mouth 2 (two) times daily. 120 tablet 2  . sodium zirconium cyclosilicate (LOKELMA) 10 g PACK packet Take 10 g by mouth 3 (three) times daily. 90 packet 0  .  sulfamethoxazole-trimethoprim (BACTRIM DS,SEPTRA DS) 800-160 MG tablet Take 1 tablet by mouth 2 (two) times daily. (Patient not taking: Reported on 10/07/2018) 20 tablet 0   Allergies: Allergies as of 10/16/2018  . (No Known Allergies)   Past Medical History:  Diagnosis Date  . CKD (chronic kidney disease) stage 5, GFR less than 15 ml/min (HCC) 06/19/2016   06/30/16: Seen by Dr. Posey Pronto [Nephrology]. Suspect etiologies to include diabetes and hypertension though cannot exclude HIV-associated nephropathy. Advised her against from using excessive Goody powder.  . Contact dermatitis 04/21/2016   07/29/16: Skin biopsy performed by [facility] notable for acute spongiotic dermatitis consistent with contact dermatitis.  . Cyclical vomiting    THC induced???  . Depression 06/28/2006   Qualifier: Diagnosis of  By: Riccardo Dubin MD, [place]    . Diabetes mellitus type 2 in obese ([place]) 06/28/1994  . DKA (diabetic ketoacidoses) ([place]) 12/2014  . Erosive esophagitis   . Esophageal reflux   . Essential hypertension 11/16/2013  . Gastroparesis    ? diabetic  . GERD (gastroesophageal reflux disease)   . Human immunodeficiency virus (HIV) disease ([place]) 04/23/2016  . Hypertension   . Moderate nonproliferative diabetic retinopathy of both eyes ([place]) 11/21/2014   11/14/14: Noted on retinal imaging; needs follow-up imaging in 6 months  05/22/16: Noted on retinal imaging again; needs follow-up imaging in 6 months  . Normocytic anemia   . TOBACCO USER 06/28/2006   Qualifier: Diagnosis of  By: Riccardo Dubin MD, Sherren Mocha    . Type II diabetes mellitus ([place]) 1994   diagnosed around 59   Family History  Problem Relation Age of Onset  . Diabetes Mother   . Diabetes Brother   . Diabetes Daughter   . Diabetes Daughter   . Mental retardation Brother        died from PNA  . Diabetes Maternal Grandmother    Social History: She lives at home with her daughters and is able to perform her ADL's and still get around  without any walking aid post amputation. She smoked 1/2 a pack of cigarettes a day for about 10 years; quit five years ago. She uses marijuana about 3 days a week since she was in high school. Denies any alcohol use.  Review of Systems: A complete ROS was negative except as per HPI.   Physical Exam: Blood pressure 107/82, pulse 79, temperature 99.1 F (37.3 C), temperature source Temporal, resp. rate 17, SpO2 97 %.  General: Obese female in no acute distress HENT: Normocephalic, atraumatic, moist mucus membranes Pulm: Good air movement with no wheezing or crackles  CV: RRR, no murmurs, no rubs  Abdomen: Active bowel sounds, soft, non-distended, no  tenderness to palpation  Extremities: S/p left BKA, dressing clean and dry, RLE without edema  Skin: Warm and dry  Neuro: Alert and oriented x 3  Assessment & Plan by Problem: Principal Problem:   BKA stump complication (HCC) Active Problems:   Diabetes mellitus type 2 in obese (HCC)   Essential hypertension   Human immunodeficiency virus (HIV) disease (HCC)   CKD (chronic kidney disease), stage V ([place])  Joshlyn Beadle is a 43 y.o. 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 Her left BKA surgical site was found to be open. Orthopedic surgery has evaluated the patient and dressed the wound.   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 Appreciate their help  - Keep NPO incase bleeding recurs and patient needs to go back to the OR urgently  - Dilaudid for pain control given the patient's CKD. 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 Indicating the patient has iron deficiency and anemia or chronic disease. She is getting weekly Epo injections and IV iron  - Hgb stable around 8.3 compared to discharge  - Continue to monitor   CKD stage V Hyperkalemia - s/p left brachiobasilic AVF on 46/56/8127  - Labs stable with chronic hyperkalemia  - Continue Lokelma TID - Continue PhosLo, Aranesp, sodium bicarb per nephro  Type 2 DM - SSI and then will Start Lantus 5 units QHS after surgery  Hypertension  - BP currently at goal of <130/80  - Continue home Amlodipine 10 mg QD and Labetalol 100 mg BID  HIV - Continue Dolutegravir-Rilpivirine   Diet: NPO in case bleeding develops and patient needs to go to the OR VTE ppx: None due to open BKA site CODE STATUS: Full   Dispo: Admit patient to Inpatient with expected length of stay greater than 2 midnights.  Signed: Ina Homes, MD 10/16/2018, 9:13 PM  Pager: [phone]

## 2018-10-16 NOTE — ED Triage Notes (Signed)
Patient with recent [place] last Saturday, she fell at home and all her staples have come out and the surgical wound is wide open.  Bleeding controlled, open wound.  Wet to dry dressing applied at this time.

## 2018-10-16 NOTE — Plan of Care (Signed)
  Problem: Education: Goal: Knowledge of General Education information will improve Description: Including pain rating scale, medication(s)/side effects and non-pharmacologic comfort measures Outcome: Progressing   Problem: Health Behavior/Discharge Planning: Goal: Ability to manage health-related needs will improve Outcome: Progressing   Problem: Clinical Measurements: Goal: Ability to maintain clinical measurements within normal limits will improve Outcome: Progressing Goal: Will remain free from infection Outcome: Progressing Goal: Diagnostic test results will improve Outcome: Progressing Goal: Cardiovascular complication will be avoided Outcome: Progressing   Problem: Activity: Goal: Risk for activity intolerance will decrease Outcome: Progressing   Problem: Coping: Goal: Level of anxiety will decrease Outcome: Progressing   Problem: Elimination: Goal: Will not experience complications related to bowel motility Outcome: Progressing Goal: Will not experience complications related to urinary retention Outcome: Progressing   Problem: Safety: Goal: Ability to remain free from injury will improve Outcome: Progressing   Problem: Skin Integrity: Goal: Risk for impaired skin integrity will decrease Outcome: Progressing   

## 2018-10-16 NOTE — ED Provider Notes (Signed)
[facility] EMERGENCY DEPARTMENT Provider Note   CSN: [id number] Arrival date & time: 10/16/18  1900    History   Chief Complaint Chief Complaint  Patient presents with  . Fall    HPI Kathy Frank is a 43 y.o. female.      Fall  This is a new problem. The current episode started less than 1 hour ago. The problem occurs rarely. The problem has been resolved. Pertinent negatives include no chest pain, no abdominal pain, no headaches and no shortness of breath. Associated symptoms comments: Open left BKA stump, hemostatic on arrival.. Nothing aggravates the symptoms. Nothing relieves the symptoms. She has tried nothing for the symptoms. The treatment provided no relief.    Past Medical History:  Diagnosis Date  . CKD (chronic kidney disease) stage 5, GFR less than 15 ml/min (HCC) 06/19/2016   06/30/16: Seen by Dr. Posey Pronto [Nephrology]. Suspect etiologies to include diabetes and hypertension though cannot exclude HIV-associated nephropathy. Advised her against from using excessive Goody powder.  . Contact dermatitis 04/21/2016   07/29/16: Skin biopsy performed by [facility] for acute spongiotic dermatitis consistent with contact dermatitis.  . Cyclical vomiting    THC induced???  . Depression 06/28/2006   Qualifier: Diagnosis of  By: Riccardo Dubin MD, [place]    . Diabetes mellitus type 2 in obese ([place]) 06/28/1994  . DKA (diabetic ketoacidoses) ([place]) 12/2014  . Erosive esophagitis   . Esophageal reflux   . Essential hypertension 11/16/2013  . Gastroparesis    ? diabetic  . GERD (gastroesophageal reflux disease)   . Human immunodeficiency virus (HIV) disease ([place]) 04/23/2016  . Hypertension   . Moderate nonproliferative diabetic retinopathy of both eyes ([place]) 11/21/2014   11/14/14: Noted on retinal imaging; needs follow-up imaging in 6 months  05/22/16: Noted on retinal imaging again; needs follow-up imaging in 6 months  . Normocytic anemia   .  TOBACCO USER 06/28/2006   Qualifier: Diagnosis of  By: Riccardo Dubin MD, Sherren Mocha    . Type II diabetes mellitus ([place]) 1994   diagnosed around 1994    Patient Active Problem List   Diagnosis Date Noted  . BKA stump complication ([place]) 08/12/7251  . Osteomyelitis ([place]) 10/07/2018  . Dehiscence of amputation stump ([place])   . Moderate protein-calorie malnutrition ([place])   . Adjustment disorder with mixed anxiety and depressed mood 08/15/2018  . Wound dehiscence s/p L transmetatarsal amputation   . Wound infection s/p L transmetatarsal amputation   . CKD (chronic kidney disease), stage V ([place])   . Acute on chronic anemia 07/03/2018  . Fecal incontinence 02/04/2018  . Healthcare maintenance 09/17/2017  . Right leg swelling 02/26/2017  . Superficial ulcerative lesion ([place]) 02/02/2017  . Aortic atherosclerosis ([place]) 09/30/2016  . Diabetic foot ulcer associated with diabetes mellitus due to underlying condition ([place]) 07/08/2016  . Human immunodeficiency virus (HIV) disease ([place]) 04/23/2016  . Gastroparesis   . Moderate nonproliferative diabetic retinopathy of both eyes ([place]) 11/21/2014  . Esophageal reflux   . Microscopic hematuria 10/31/2014  . Essential hypertension 11/16/2013  . TOBACCO USER 06/28/2006  . Depression 06/28/2006  . Diabetes mellitus type 2 in obese [facility]) 06/28/1994    Past Surgical History:  Procedure Laterality Date  . AMPUTATION Left 07/08/2018   Procedure: AMPUTATION FORTH RAY LEFT FOOT;  Surgeon: [name], MD;  Location: [facility];  Service: Orthopedics;  Laterality: Left;  . AMPUTATION Left 08/09/2018   Procedure: Left Transmetatarsal Amputation;  Surgeon: Meridee Score  V, MD;  Location: [place] [facility];  Service: Orthopedics;  Laterality: Left;  . AMPUTATION Left 10/08/2018   Procedure: LEFT BELOW KNEE AMPUTATION;  Surgeon: [name], MD;  Location: [facility];  Service: Orthopedics;  Laterality: Left;  . AV FISTULA PLACEMENT Left 10/14/2018   Procedure: Arteriovenous (Av) Fistula  Creation Left Arm;  Surgeon: [name], MD;  Location: [place];  Service: Vascular;  Laterality: Left;  . LOWER EXTREMITY ANGIOGRAPHY N/A 07/05/2018   Procedure: LOWER EXTREMITY ANGIOGRAPHY;  Surgeon: Serafina Mitchell, MD;  Location: [facility] CV [facility];  Service: Cardiovascular;  Laterality: N/A;  . PERIPHERAL VASCULAR BALLOON ANGIOPLASTY Left 07/05/2018   Procedure: PERIPHERAL VASCULAR BALLOON ANGIOPLASTY;  Surgeon: Serafina Mitchell, MD;  Location: [place] [facility];  Service: Cardiovascular;  Laterality: Left;  SFA  . TUBAL LIGATION  2002     OB History   No obstetric history on file.      Home Medications    Prior to Admission medications   Medication Sig Start Date End Date Taking? Authorizing Provider  amLODipine (NORVASC) 10 MG tablet Take 1 tablet (10 mg total) by mouth daily. 12/24/17 10/07/18  Collier Salina, MD  calcitRIOL (ROCALTROL) 0.25 MCG capsule Take 1 capsule (0.25 mcg total) by mouth daily. 10/17/18   [name], MD  calcium acetate (PHOSLO) 667 MG capsule Take 2 capsules (1,334 mg total) by mouth 3 (three) times daily with meals. 10/16/18   [name], MD  citalopram (CELEXA) 20 MG tablet Take 1 tablet (20 mg total) by mouth daily. 08/12/18 10/07/18  Carroll Sage, MD  Darbepoetin Alfa (ARANESP) 60 MCG/0.3ML SOSY injection Inject 0.3 mLs (60 mcg total) into the skin every Tuesday at 6 PM. 10/18/18   [name], MD  Dolutegravir-Rilpivirine (JULUCA) 50-25 MG TABS Take 1 tablet by mouth daily. Please take with largest meal of the day. 07/05/18   Annia Belt, MD  gabapentin (NEURONTIN) 300 MG capsule Take 1 capsule (300 mg total) by mouth daily. 10/17/18   [name], MD  glucose blood (ACCU-CHEK GUIDE) test strip USE TO TEST BLOOD SUGAR 3 TIMES DAILY . Diagnosis code  E11.69, E66.9 07/09/17   Lars Mage, MD  Insulin Glargine (LANTUS) 100 UNIT/ML Solostar Pen Inject 5 Units into the skin at bedtime. 10/16/18   [name], MD  labetalol (NORMODYNE) 100 MG tablet TAKE 1 TABLET BY MOUTH 2 TIMES DAILY Patient taking differently: Take 100 mg by mouth 2 (two) times daily.  07/13/18   Chundi, Verne Spurr, MD  liraglutide (VICTOZA) 18 MG/3ML SOPN Inject 0.2 mLs (1.2 mg total) into the skin daily. 02/04/18   Chundi, Verne Spurr, MD  methocarbamol (ROBAXIN) 500 MG tablet Take 1 tablet (500 mg total) by mouth every 6 (six) hours as needed for muscle spasms. 10/16/18   [name], MD  nutrition supplement, JUVEN, (JUVEN) PACK Take 1 packet by mouth 2 (two) times daily between meals. 10/16/18   [name], MD  ondansetron (ZOFRAN) 4 MG tablet Take 1 tablet (4 mg total) by mouth daily as needed for nausea or vomiting. 08/19/18 08/19/19  Alphonzo Grieve, MD  oxyCODONE (OXY IR/ROXICODONE) 5 MG immediate release tablet Take 1 tablet (5 mg total) by mouth every 6 (six) hours as needed for up to 5 days for moderate pain or severe pain. 10/16/18 10/21/18  [name], MD  polyethylene glycol (MIRALAX / Floria Raveling) packet Take 17 g by mouth daily. Patient taking differently: Take 17 g by mouth daily as needed for  mild constipation.  08/12/18   Carroll Sage, MD  senna (SENOKOT) 8.6 MG TABS tablet Take 2 tablets (17.2 mg total) by mouth 2 (two) times daily. Patient taking differently: Take 2 tablets by mouth daily as needed for mild constipation.  08/12/18   Carroll Sage, MD  sodium bicarbonate 650 MG tablet Take 2 tablets (1,300 mg total) by mouth 2 (two) times daily. 10/16/18   [name], MD  sodium zirconium cyclosilicate (LOKELMA) 10 g PACK packet Take 10 g by mouth 3 (three) times daily. 10/16/18   [name], MD  sulfamethoxazole-trimethoprim (BACTRIM DS,SEPTRA DS) 800-160 MG tablet Take 1 tablet by mouth 2 (two) times daily. Patient not taking: Reported on 10/07/2018 09/15/18   Rayburn, Neta Mends, PA-C    Family History Family History  Problem Relation Age of Onset  . Diabetes Mother   .  Diabetes Brother   . Diabetes Daughter   . Diabetes Daughter   . Mental retardation Brother        died from PNA  . Diabetes Maternal Grandmother     Social History Social History   Tobacco Use  . Smoking status: Former Smoker    Packs/day: 0.10    Years: 10.00    Pack years: 1.00    Types: Cigarettes    Start date: 11/17/2013    Last attempt to quit: 09/09/2014    Years since quitting: 4.1  . Smokeless tobacco: Never Used  Substance Use Topics  . Alcohol use: Yes    Comment: Occasionally.  . Drug use: Yes    Frequency: 4.0 times per week    Types: Marijuana     Allergies   Patient has no known allergies.   Review of Systems Review of Systems  Constitutional: Negative for chills and fever.  HENT: Negative for ear pain and sore throat.   Eyes: Negative for pain and visual disturbance.  Respiratory: Negative for cough and shortness of breath.   Cardiovascular: Negative for chest pain and palpitations.  Gastrointestinal: Negative for abdominal pain and vomiting.  Genitourinary: Negative for dysuria and hematuria.  Musculoskeletal: Negative for arthralgias and back pain.  Skin: Negative for color change and rash.  Neurological: Negative for seizures, syncope and headaches.  All other systems reviewed and are negative.    Physical Exam Updated Vital Signs BP 96/81 (BP Location: Right Arm)   Pulse 81   Temp 98.3 F (36.8 C) (Oral)   Resp 17   SpO2 100%   Physical Exam Vitals signs and nursing note reviewed.  Constitutional:      General: She is not in acute distress.    Appearance: She is well-developed.     Comments: Patient hemodynamically stable on arrival, GCS 15.  HENT:     Head: Normocephalic and atraumatic.  Eyes:     Conjunctiva/sclera: Conjunctivae normal.  Neck:     Musculoskeletal: Neck supple.  Cardiovascular:     Rate and Rhythm: Normal rate and regular rhythm.     Heart sounds: No murmur.  Pulmonary:     Effort: Pulmonary effort is  normal. No respiratory distress.     Breath sounds: Normal breath sounds.  Abdominal:     Palpations: Abdomen is soft.     Tenderness: There is no abdominal tenderness.  Musculoskeletal:        General: Signs of injury present.     Comments: Patient has open left BKA stump.  Hemostatic.  Skin:    General: Skin is warm and dry.  Capillary Refill: Capillary refill takes less than 2 seconds.  Neurological:     General: No focal deficit present.     Mental Status: She is alert.  Psychiatric:        Mood and Affect: Mood normal.      ED Treatments / Results  Labs (all labs ordered are listed, but only abnormal results are displayed) Labs Reviewed  COMPREHENSIVE METABOLIC PANEL - Abnormal; Notable for the following components:      Result Value   Potassium 5.8 (*)    BUN 89 (*)    Creatinine, Ser 7.19 (*)    Calcium 8.4 (*)    Albumin 2.7 (*)    GFR calc non Af Amer 6 (*)    GFR calc Af Amer 7 (*)    All other components within normal limits  CBC WITH DIFFERENTIAL/PLATELET - Abnormal; Notable for the following components:   RBC 2.78 (*)    Hemoglobin 8.3 (*)    HCT 26.3 (*)    RDW 15.8 (*)    Eosinophils Absolute 0.6 (*)    All other components within normal limits  GLUCOSE, CAPILLARY - Abnormal; Notable for the following components:   Glucose-Capillary 123 (*)    All other components within normal limits  I-STAT BETA HCG BLOOD, ED ([facility], [facility], [facility] ONLY) - Abnormal; Notable for the following components:   I-stat hCG, quantitative 8.7 (*)    All other components within normal limits  CBC  RENAL FUNCTION PANEL  TYPE AND SCREEN    EKG None  Radiology Dg Knee 2 Views Left  Result Date: 10/16/2018 CLINICAL DATA:  Amputation.  Fall. EXAM: LEFT KNEE - 1-2 VIEW COMPARISON:  None. FINDINGS: Status post amputation. The skin staples appear displaced consistent with the history of an open wound. No acute fracture. No other acute abnormalities. IMPRESSION: Status post amputation.  No acute bony abnormality. Opened wound per history. Electronically Signed   By: Dorise Bullion III M.D   On: 10/16/2018 20:05    Procedures Procedures (including critical care time)  Medications Ordered in ED Medications  fentaNYL (SUBLIMAZE) injection 50 mcg (50 mcg Intravenous Given 10/16/18 2015)  amLODipine (NORVASC) tablet 10 mg (has no administration in time range)  labetalol (NORMODYNE) tablet 100 mg (has no administration in time range)  citalopram (CELEXA) tablet 20 mg (has no administration in time range)  calcitRIOL (ROCALTROL) capsule 0.25 mcg (has no administration in time range)  calcium acetate (PHOSLO) capsule 1,334 mg (has no administration in time range)  polyethylene glycol (MIRALAX / GLYCOLAX) packet 17 g (has no administration in time range)  senna (SENOKOT) tablet 17.2 mg (has no administration in time range)  sodium bicarbonate tablet 1,300 mg (has no administration in time range)  sodium zirconium cyclosilicate (LOKELMA) packet 10 g (has no administration in time range)  gabapentin (NEURONTIN) capsule 300 mg (has no administration in time range)  ondansetron (ZOFRAN) tablet 4 mg (has no administration in time range)    Or  ondansetron (ZOFRAN) injection 4 mg (has no administration in time range)  HYDROmorphone (DILAUDID) injection 1 mg (1 mg Intravenous Given 10/16/18 2144)  methocarbamol (ROBAXIN) tablet 500 mg (has no administration in time range)  dolutegravir (TIVICAY) tablet 50 mg (has no administration in time range)    And  rilpivirine (EDURANT) tablet 25 mg (has no administration in time range)  fentaNYL (SUBLIMAZE) injection 50 mcg (50 mcg Intravenous Given 10/16/18 1922)  HYDROmorphone (DILAUDID) injection 1 mg (1 mg Intravenous Given 10/16/18 1939)  Initial Impression / Assessment and Plan / ED Course  I have reviewed the triage vital signs and the nursing notes.  Pertinent labs & imaging results that were available during my care of the patient were  reviewed by me and considered in my medical decision making (see chart for details).        43 year old female significant past medical history 43 y.o. with a PMH of T2DM, HTN, CKD stage 5, HIV and BKA by Dr. Sharol Given on 29 February who had a mechanical ground-level fall today and had traumatic opening of the suture line of her BKA.  Patient denies any other injury, hemodynamically stable on arrival.  Wound is hemostatic at this time.  We will perform basic laboratory studies, type and screen as well as provide pain management here in the emergency department.  Consulted orthopedics who recommends admission to the medicine service due to comorbidities, wet-to-dry packing with Ace wrap.  Patient will likely have revision sometime tomorrow or the next day.  Discussed with inpatient team who is in agreement with admission.  Patient given pain management here in the emergency department.  Patient resting comfortably, no acute distress.  Patient admitted.  The above care was discussed and agreed upon by my attending physician.  Final Clinical Impressions(s) / ED Diagnoses   Final diagnoses:  BKA stump complication [facility])    ED Discharge Orders    None       Orson Aloe, MD 10/16/18 9450    Lajean Saver, MD 10/20/18 1427

## 2018-10-16 NOTE — Consult Note (Signed)
Reason for consultation: Left below-knee amputation stump wound dehiscence  The patient is someone who is a week and 1 day out from a left below-knee amputation that was performed by my partner Dr. Sharol Given.  She is a diabetic with peripheral vascular disease and had her below-knee amputation of the left side performed 10/08/2018.  She was actually discharged from the hospital today.  At home she sustained a hard mechanical fall and opened up the amputation stump in its entirety.  She was transported to the [facility] emergency room.  Orthopedic surgery was consulted to assess the wound.  On exam her below-knee amputation stump is completely open all the way down to the bone.  There is no active bleeding but the flap is open completely.  I was able to place a wet-to-dry dressing in the amputation stump and to bring the flap back over top and placed multiple layers of dry Kerlix and ABDs around the wound followed by several Ace wraps.  I did speak with Dr. Sharol Given who will see her first thing in the morning and make plans for surgery at some point tomorrow for an irrigation and debridement and likely revision amputation.  She did drink a big gulp while she was arriving to the hospital and has eaten today.  I did communicate with the teaching service the surgical plan and appreciate their medical management of this patient.  Certainly if there is any issues this evening I can be called directly at [phone].

## 2018-10-16 NOTE — Care Management (Signed)
Wheelchair, 3n1, and RW ordered from [employer] to be delivered to room prior to discharge.

## 2018-10-16 NOTE — Discharge Instructions (Signed)
[facility]  Discharge Instructions  AV Fistula or Graft Surgery for Dialysis Access  Please refer to the following instructions for your post-procedure care. Your surgeon or physician assistant will discuss any changes with you.  Activity  You may drive the day following your surgery, if you are comfortable and no longer taking prescription pain medication. Resume full activity as the soreness in your incision resolves.  Bathing/Showering  You may shower after you go home. Keep your incision dry for 48 hours. Do not soak in a bathtub, hot tub, or swim until the incision heals completely. You may not shower if you have a hemodialysis catheter.  Incision Care  Clean your incision with mild soap and water after 48 hours. Pat the area dry with a clean towel. You do not need a bandage unless otherwise instructed. Do not apply any ointments or creams to your incision. You may have skin glue on your incision. Do not peel it off. It will come off on its own in about one week. Your arm may swell a bit after surgery. To reduce swelling use pillows to elevate your arm so it is above your heart. Your doctor will tell you if you need to lightly wrap your arm with an ACE bandage.  Diet  Resume your normal diet. There are not special food restrictions following this procedure. In order to heal from your surgery, it is CRITICAL to get adequate nutrition. Your body requires vitamins, minerals, and protein. Vegetables are the best source of vitamins and minerals. Vegetables also provide the perfect balance of protein. Processed food has little nutritional value, so try to avoid this.  Medications  Resume taking all of your medications. If your incision is causing pain, you may take over-the counter pain relievers such as acetaminophen (Tylenol). If you were prescribed a stronger pain medication, please be aware these medications can cause nausea and constipation. Prevent  nausea by taking the medication with a snack or meal. Avoid constipation by drinking plenty of fluids and eating foods with high amount of fiber, such as fruits, vegetables, and grains.  Do not take Tylenol if you are taking prescription pain medications.  Follow up Your surgeon may want to see you in the office following your access surgery. If so, this will be arranged at the time of your surgery.  Please call us immediately for any of the following conditions:  Increased pain, redness, drainage (pus) from your incision site Fever of 101 degrees or higher Severe or worsening pain at your incision site Hand pain or numbness.  Reduce your risk of vascular disease:  Stop smoking. If you would like help, call [facility] at 1-800-QUIT-NOW (617) 088-0034) or [facility] at [place] your cholesterol Maintain a desired weight Control your diabetes Keep your blood pressure down  Dialysis  It will take several weeks to several months for your new dialysis access to be ready for use. Your surgeon will determine when it is okay to use it. Your nephrologist will continue to direct your dialysis. You can continue to use your Permcath until your new access is ready for use.   10/14/2018 Kathy Frank [id number] March 06, 1976  Surgeon(s): [name], MD  Procedure(s): Creation left 1st stage basilic vein transposition  x Do not stick fistula for 12 weeks    If you have any questions, please call the office at [phone].    Eating Plan for Dialysis Dialysis is a treatment that cleans your blood.  It is used when your kidneys are damaged. When you need dialysis, you should watch what you eat. This is because some nutrients can build up in your blood between treatments and make you sick. Your doctor or diet specialist (dietitian) will:  Tell you what nutrients you should include or avoid.  Tell you how much of these nutrients you should get each day.  Help you  plan meals.  Tell you how much to drink each day. What are tips for following this plan? Reading food labels  Check food labels for: ? Potassium. This is found in milk, fruits, and vegetables. ? Phosphorus. This is found in milk, cheese, beans, nuts, and carbonated beverages. ? Salt (sodium). This is in processed meats, cured meats, ready-made frozen meals, canned vegetables, and salty snack foods.  Try to find foods that are low in potassium, phosphorus, and sodium.  Look for foods that are labeled "sodium free," "reduced sodium," or "low sodium." Shopping  Do not buy whole-grain and high-fiber foods.  Do not buy or use salt substitutes.  Do not buy processed foods. Cooking  Drain all fluid from cooked vegetables and canned fruits before you eat them.  Before you cook potatoes, cut them into small pieces. Then boil them in unsalted water.  Try using herbs and spices that do not contain sodium to add flavor. Meal planning Most people on dialysis should try to eat:  6-11 servings of grains each day. One serving is equal to 1 slice of bread or  cup of cooked rice or pasta.  2-3 servings of low-potassium vegetables each day. One serving is equal to  cup.  2-3 servings of low-potassium fruits each day. One serving is equal to  cup.  Protein, such as meat, poultry, fish, and eggs. Talk with your doctor or dietitian about the right amount and type of protein to eat.   cup of dairy each day. General information  Follow your doctor's instructions about how much to drink. You may be told to: ? Write down what you drink. ? Write down the foods you eat that are made mostly from water, such as gelatin and soups. ? Drink from small cups.  Take vitamin and mineral supplements only as told by your doctor.  Take over-the-counter and prescription medicines only as told by your doctor. What foods can I eat?     Fruits Apples. Fresh or frozen berries. Fresh or canned pears,  peaches, and pineapple. Grapes. Plums. Vegetables Fresh or frozen broccoli, carrots, and green beans. Cabbage. Cauliflower. Celery. Cucumbers. Eggplant. Radishes. Zucchini. Grains White bread. White rice. Cooked cereal. Unsalted popcorn. Tortillas. Pasta. Meats and other proteins Fresh or frozen beef, pork, chicken, and fish. Eggs. Dairy Cream cheese. Heavy cream. Ricotta cheese. Beverages Apple cider. Cranberry juice. Grape juice. Lemonade. Black coffee. Rice milk (that is not enriched or fortified). Seasonings and condiments Herbs. Spices. Jam and jelly. Honey. Sweets and desserts Sherbet. Cakes. Cookies. Fats and oils Olive oil, canola oil, and safflower oil. Other foods Non-dairy creamer. Non-dairy whipped topping. Homemade broth without salt. The items listed above may not be a complete list of foods and beverages you can eat. Contact your dietitian for more options. What foods should I avoid? Fruits Star fruit. Bananas. Oranges. Kiwi. Nectarines. Prunes. Melon. Dried fruit. Avocado. Vegetables Potatoes. Beets. Tomatoes. Winter squash and pumpkin. Asparagus. Spinach. Parsnips. Grains Whole-grain bread. Whole-grain pasta. High-fiber cereal. Meats and other proteins Canned, smoked, and cured meats. Packaged lunch meat. Sardines. Nuts and seeds. Peanut butter.  Beans and legumes. Dairy Milk. Buttermilk. Yogurt. Cheese and cottage cheese. Processed cheese spreads. Beverages Orange juice. Prune juice. Carbonated soft drinks. Seasonings and condiments Salt. Salt substitutes. Soy sauce. Sweets and desserts Ice cream. Chocolate. Candied nuts. Fats and oils Butter. Margarine. Other foods Ready-made frozen meals. Canned soups. The items listed above may not be a complete list of foods and beverages you should avoid. Contact your dietitian for more information. Summary  If you are having dialysis, it is important to watch what you eat. Certain nutrients and wastes can build up in  your blood and cause you to get sick.  Your dietitian will help you make an eating plan that meets your needs.  Avoid foods that are high in potassium, salt (sodium), and phosphorus. Restrict fluids as told by your doctor or dietitian. This information is not intended to replace advice given to you by your health care provider. Make sure you discuss any questions you have with your health care provider. Document Released: 01/26/2012 Document Revised: 10/13/2017 Document Reviewed: 07/28/2017 Elsevier Interactive Patient Education  2019 [employer].

## 2018-10-16 NOTE — ED Notes (Signed)
ED TO INPATIENT HANDOFF REPORT  ED Nurse Name and Phone #:  Deneise Lever   [phone]  S Name/Age/Gender Kathy Frank 43 y.o. female Room/Bed: TRAAC/TRAAC  Code Status   Code Status: Full Code  Home/SNF/Other Home Patient oriented to: self, place, time and situation Is this baseline? Yes   Triage Complete: Triage complete  Chief Complaint fall  Triage Note Patient with recent [place] last Saturday, she fell at home and all her staples have come out and the surgical wound is wide open.  Bleeding controlled, open wound.  Wet to dry dressing applied at this time.     Allergies No Known Allergies  Level of Care/Admitting Diagnosis ED Disposition    ED Disposition Condition [place] Area: [place] [[id number]]  Level of Care: Med-Surg [16]  Diagnosis: BKA stump complication [facility]) [878676]  Admitting Physician: Annia Belt [[id number]]  Attending Physician: Annia Belt [[id number]]  Estimated length of stay: 3 - 4 days  Certification:: I certify this patient will need inpatient services for at least 2 midnights  PT Class (Do Not Modify): Inpatient [101]  PT Acc Code (Do Not Modify): Private [1]       B Medical/Surgery History Past Medical History:  Diagnosis Date  . CKD (chronic kidney disease) stage 5, GFR less than 15 ml/min (HCC) 06/19/2016   06/30/16: Seen by Dr. Posey Pronto [Nephrology]. Suspect etiologies to include diabetes and hypertension though cannot exclude HIV-associated nephropathy. Advised her against from using excessive Goody powder.  . Contact dermatitis 04/21/2016   07/29/16: Skin biopsy performed by [facility] notable for acute spongiotic dermatitis consistent with contact dermatitis.  . Cyclical vomiting    THC induced???  . Depression 06/28/2006   Qualifier: Diagnosis of  By: Riccardo Dubin MD, [place]    . Diabetes mellitus type 2 in obese ([place]) 06/28/1994  . DKA (diabetic ketoacidoses) ([place]) 12/2014  . Erosive  esophagitis   . Esophageal reflux   . Essential hypertension 11/16/2013  . Gastroparesis    ? diabetic  . GERD (gastroesophageal reflux disease)   . Human immunodeficiency virus (HIV) disease ([place]) 04/23/2016  . Hypertension   . Moderate nonproliferative diabetic retinopathy of both eyes ([place]) 11/21/2014   11/14/14: Noted on retinal imaging; needs follow-up imaging in 6 months  05/22/16: Noted on retinal imaging again; needs follow-up imaging in 6 months  . Normocytic anemia   . TOBACCO USER 06/28/2006   Qualifier: Diagnosis of  By: Riccardo Dubin MD, Sherren Mocha    . Type II diabetes mellitus ([place]) 1994   diagnosed around 1994   Past Surgical History:  Procedure Laterality Date  . AMPUTATION Left 07/08/2018   Procedure: AMPUTATION FORTH RAY LEFT FOOT;  Surgeon: [name], MD;  Location: [place];  Service: Orthopedics;  Laterality: Left;  . AMPUTATION Left 08/09/2018   Procedure: Left Transmetatarsal Amputation;  Surgeon: [name], MD;  Location: [place];  Service: Orthopedics;  Laterality: Left;  . AMPUTATION Left 10/08/2018   Procedure: LEFT BELOW KNEE AMPUTATION;  Surgeon: [name], MD;  Location: [place];  Service: Orthopedics;  Laterality: Left;  . AV FISTULA PLACEMENT Left 10/14/2018   Procedure: Arteriovenous (Av) Fistula Creation Left Arm;  Surgeon: [name], MD;  Location: [place];  Service: Vascular;  Laterality: Left;  . LOWER EXTREMITY ANGIOGRAPHY N/A 07/05/2018   Procedure: LOWER EXTREMITY ANGIOGRAPHY;  Surgeon: Serafina Mitchell, MD;  Location: [place] [facility];  Service: Cardiovascular;  Laterality:  N/A;  . PERIPHERAL VASCULAR BALLOON ANGIOPLASTY Left 07/05/2018   Procedure: PERIPHERAL VASCULAR BALLOON ANGIOPLASTY;  Surgeon: Serafina Mitchell, MD;  Location: [place] [facility];  Service: Cardiovascular;  Laterality: Left;  SFA  . TUBAL LIGATION  2002     A IV Location/Drains/Wounds Patient Lines/Drains/Airways Status   Active Line/Drains/Airways    Name:    Placement date:   Placement time:   Site:   Days:   Fistula / Graft Left Forearm Arteriovenous fistula   10/14/18    0843    Forearm   2   Peripheral IV (Ped) 10/16/18 Hand   10/16/18    1922     less than 1   Incision (Closed) 08/09/18 Foot Left   08/09/18    1138     68   Incision (Closed) 10/14/18 Arm Left   10/14/18    0845     2   Incision (Closed) 09/07/18 Leg Left   09/07/18    2000     39          Intake/Output Last 24 hours No intake or output data in the 24 hours ending 10/16/18 2127  Labs/Imaging Results for orders placed or performed during the hospital encounter of 10/16/18 (from the past 48 hour(s))  Type and screen [place]     Status: None   Collection Time: 10/16/18  7:24 PM  Result Value Ref Range   ABO/RH(D) B POS    Antibody Screen NEG    Sample Expiration      10/19/2018 Performed at [facility], [place] [address] [place], [place] [address]   I-Stat Beta hCG blood, ED ([facility], [facility], [facility] only)     Status: Abnormal   Collection Time: 10/16/18  7:36 PM  Result Value Ref Range   I-stat hCG, quantitative 8.7 (H) <5 mIU/mL   Comment 3            Comment:   GEST. AGE      CONC.  (mIU/mL)   <=1 WEEK        5 - 50     2 WEEKS       50 - 500     3 WEEKS       100 - 10,000     4 WEEKS     1,000 - 30,000        FEMALE AND NON-PREGNANT FEMALE:     LESS THAN 5 mIU/mL   Comprehensive metabolic panel     Status: Abnormal   Collection Time: 10/16/18  7:40 PM  Result Value Ref Range   Sodium 135 135 - 145 mmol/L   Potassium 5.8 (H) 3.5 - 5.1 mmol/L   Chloride 101 98 - 111 mmol/L   CO2 23 22 - 32 mmol/L   Glucose, Bld 97 70 - 99 mg/dL   BUN 89 (H) 6 - 20 mg/dL   Creatinine, Ser 7.19 (H) 0.44 - 1.00 mg/dL   Calcium 8.4 (L) 8.9 - 10.3 mg/dL   Total Protein 7.0 6.5 - 8.1 g/dL   Albumin 2.7 (L) 3.5 - 5.0 g/dL   AST 15 15 - 41 U/L   ALT 6 0 - 44 U/L   Alkaline Phosphatase 79 38 - 126 U/L   Total Bilirubin 0.5 0.3 - 1.2 mg/dL   GFR calc non Af Amer  6 (L) >60 mL/min   GFR calc Af Amer 7 (L) >60 mL/min   Anion gap 11 5 -  15    Comment: Performed at [facility], [place] [address], [place], [place] [address]  CBC with Differential     Status: Abnormal   Collection Time: 10/16/18  7:40 PM  Result Value Ref Range   WBC 9.3 4.0 - 10.5 K/uL   RBC 2.78 (L) 3.87 - 5.11 MIL/uL   Hemoglobin 8.3 (L) 12.0 - 15.0 g/dL   HCT 26.3 (L) 36.0 - 46.0 %   MCV 94.6 80.0 - 100.0 fL   MCH 29.9 26.0 - 34.0 pg   MCHC 31.6 30.0 - 36.0 g/dL   RDW 15.8 (H) 11.5 - 15.5 %   Platelets 354 150 - 400 K/uL   nRBC 0.0 0.0 - 0.2 %   Neutrophils Relative % 71 %   Neutro Abs 6.6 1.7 - 7.7 K/uL   Lymphocytes Relative 14 %   Lymphs Abs 1.3 0.7 - 4.0 K/uL   Monocytes Relative 8 %   Monocytes Absolute 0.8 0.1 - 1.0 K/uL   Eosinophils Relative 7 %   Eosinophils Absolute 0.6 (H) 0.0 - 0.5 K/uL   Basophils Relative 0 %   Basophils Absolute 0.0 0.0 - 0.1 K/uL   Immature Granulocytes 0 %   Abs Immature Granulocytes 0.04 0.00 - 0.07 K/uL    Comment: Performed at [facility], [place] [address] [place], [place] [address]   Dg Knee 2 Views Left  Result Date: 10/16/2018 CLINICAL DATA:  Amputation.  Fall. EXAM: LEFT KNEE - 1-2 VIEW COMPARISON:  None. FINDINGS: Status post amputation. The skin staples appear displaced consistent with the history of an open wound. No acute fracture. No other acute abnormalities. IMPRESSION: Status post amputation. No acute bony abnormality. Opened wound per history. Electronically Signed   By: Dorise Bullion III M.D   On: 10/16/2018 20:05    Pending Labs Unresulted Labs (From admission, onward)    Start     Ordered   10/17/18 0500  CBC  Tomorrow morning,   R     10/16/18 2050   10/17/18 0500  Renal function panel  Tomorrow morning,   R     10/16/18 2050          Vitals/Pain Today's Vitals   10/16/18 1927 10/16/18 1928 10/16/18 2000 10/16/18 2030  BP: 117/67 117/67 116/67 107/82  Pulse: 80  79 79  Resp: (!) 23  17   Temp:       TempSrc:      SpO2: 98%  100% 97%  PainSc:        Isolation Precautions No active isolations  Medications Medications  fentaNYL (SUBLIMAZE) injection 50 mcg (50 mcg Intravenous Given 10/16/18 2040)  Dolutegravir-Rilpivirine 50-25 MG TABS 1 tablet (has no administration in time range)  amLODipine (NORVASC) tablet 10 mg (has no administration in time range)  labetalol (NORMODYNE) tablet 100 mg (has no administration in time range)  citalopram (CELEXA) tablet 20 mg (has no administration in time range)  calcitRIOL (ROCALTROL) capsule 0.25 mcg (has no administration in time range)  calcium acetate (PHOSLO) capsule 1,334 mg (has no administration in time range)  polyethylene glycol (MIRALAX / GLYCOLAX) packet 17 g (has no administration in time range)  senna (SENOKOT) tablet 17.2 mg (has no administration in time range)  sodium bicarbonate tablet 1,300 mg (has no administration in time range)  sodium zirconium cyclosilicate (LOKELMA) packet 10 g (has no administration in time range)  gabapentin (NEURONTIN) capsule 300 mg (has no administration in time range)  ondansetron (ZOFRAN)  tablet 4 mg (has no administration in time range)    Or  ondansetron (ZOFRAN) injection 4 mg (has no administration in time range)  HYDROmorphone (DILAUDID) injection 1 mg (has no administration in time range)  methocarbamol (ROBAXIN) tablet 500 mg (has no administration in time range)  fentaNYL (SUBLIMAZE) injection 50 mcg (50 mcg Intravenous Given 10/16/18 1922)  HYDROmorphone (DILAUDID) injection 1 mg (1 mg Intravenous Given 10/16/18 1939)    Mobility walks with device Moderate fall risk   Focused Assessments Wound assessment  R Recommendations: See Admitting Provider Note  Report given to:   Additional Notes:   Wound completely open following fall at home. Wet-dry dressing placed over the top, hemorrhage controlled. Restricted on L arm d/t dialysis access.

## 2018-10-16 NOTE — Progress Notes (Signed)
Subjective: The patient was sitting up in her bed today upon entering the room. She denied pain at the surgical site but stated that when the wound vac dressing was removed there was a portion of the skin that was attached to the material. She denied notable blood loss to the area but additionally adds that the sock designed to shape her leg is "too tight". She requested information regarding wound care moving forward as well as additional dietary information regarding a renal diet.   Objective:  Vital signs in last 24 hours: Vitals:   10/15/18 1925 10/15/18 2228 10/16/18 0559 10/16/18 0950  BP: (!) 131/95 (!) 142/75 (!) 156/89 (!) 144/86  Pulse: 82 83 83   Resp: 15  14   Temp: 98.4 F (36.9 C)  97.8 F (36.6 C)   TempSrc:   Oral   SpO2: 97%  100%   Weight:      Height:       General: A/O x4, in no acute distress, afebrile, nondiaphoretic Cardio: RRR, no mrg's  Pulmonary: CTA bilaterally, no wheezing or crackles  MSK: RLE nontender nonedematous, left BKA with minimal bleeding, well healing wound Psych: Appropriate affect, not depressed in appearance, engages well  Assessment/Plan:  Principal Problem:   Osteomyelitis (HCC) Active Problems:   Diabetes mellitus type 2 in obese (HCC)   Essential hypertension   Human immunodeficiency virus (HIV) disease (HCC)   CKD (chronic kidney disease), stage V (HCC)   Wound infection s/p L transmetatarsal amputation   Dehiscence of amputation stump ([place])   Moderate protein-calorie malnutrition ([place])  Kathy Frank is a 43-yr-old female past medical history notable for insulin-dependent diabetes, HTN, CKD stage V, and HIV with 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  She underwent transtibial amputation on 09/29/2018 for osteomyelitis and a nonhealing ulceration of the left distal foot.  He hospital course has been complicated by worsening renal function requiring the left AV fistula placement on 3/6 and  persistent anemia requiring multiple transfusions likely 2/2 to heavy menstruation and ESRD.  Osteomyelitis of the left foot s/p transtibial amputation: Status post 2 transtibial amputation.  Pain well controlled, wound appears to be healing as per orthopedic follow-up notes.  Wound VAC discontinued now. Continue pain control with p.o. oxycodone 10 mg every 4 hours as needed Continue gabapentin 300 mg daily Continue Robaxin every 6 hours as needed for muscle spasms Outpatient follow-up with Ortho in 1 week following discharge Likely home today as her CBC remains stable Dry gauze dressing with ACE wrap to control edema. Patient did not understand this concept, I attempted to better explain. Will have nursing staff also explain the process.   Anemia: Felt secondary to acute blood loss and end-stage renal disease.  Patient was requiring repeat transfusions totaling 6 units of PRBCs to date.  Hemoglobin this morning improved stable at 8.8. Most likely 2/2 to heavy menstrual bleeding.  --Continue Aranesp and IV iron per nephrology --CBC out outpatient visit in 2-3 days  CKD stage V and hyperkalemia: AV fistula placement completed by vascular surgeries Dr. Carlis Abbott.  Patient does not wish to have hemodialysis catheter placed at this time.  Nephrology recommended continue to monitor the patient's potassium.  Would likely consider discharge home tomorrow if her hemoglobin potassium remained stable. Discontinued lokelma due to outpatient cost Kayexalate ordered 15g TID Continue PhosLo, Aranesp, sodium bicarb per nephro Discussed need and basics of a renal diet, will provide an additional short in  DMII: Glucose stable.  Continue  Lantus 5 units nightly Continue SSI.  Diet: Renal carb modified Fluids: N/A Code: Full DVT PBX Heparin Dispo: Anticipated discharge in approximately 0 day(s).   [name], MD 10/16/2018, 10:04 AM Pager: Pager# [phone]

## 2018-10-17 ENCOUNTER — Other Ambulatory Visit: Payer: Self-pay

## 2018-10-17 DIAGNOSIS — I120 Hypertensive chronic kidney disease with stage 5 chronic kidney disease or end stage renal disease: Secondary | ICD-10-CM

## 2018-10-17 DIAGNOSIS — E1122 Type 2 diabetes mellitus with diabetic chronic kidney disease: Secondary | ICD-10-CM

## 2018-10-17 DIAGNOSIS — D62 Acute posthemorrhagic anemia: Secondary | ICD-10-CM

## 2018-10-17 DIAGNOSIS — Z79899 Other long term (current) drug therapy: Secondary | ICD-10-CM

## 2018-10-17 DIAGNOSIS — N185 Chronic kidney disease, stage 5: Secondary | ICD-10-CM

## 2018-10-17 DIAGNOSIS — T8781 Dehiscence of amputation stump: Secondary | ICD-10-CM

## 2018-10-17 DIAGNOSIS — D631 Anemia in chronic kidney disease: Secondary | ICD-10-CM

## 2018-10-17 DIAGNOSIS — I770 Arteriovenous fistula, acquired: Secondary | ICD-10-CM

## 2018-10-17 DIAGNOSIS — B20 Human immunodeficiency virus [HIV] disease: Secondary | ICD-10-CM

## 2018-10-17 DIAGNOSIS — E875 Hyperkalemia: Secondary | ICD-10-CM

## 2018-10-17 DIAGNOSIS — Z9889 Other specified postprocedural states: Secondary | ICD-10-CM

## 2018-10-17 DIAGNOSIS — Z89512 Acquired absence of left leg below knee: Secondary | ICD-10-CM

## 2018-10-17 LAB — IRON AND TIBC
Iron: 131 ug/dL (ref 28–170)
Saturation Ratios: 65 % — ABNORMAL HIGH (ref 10.4–31.8)
TIBC: 203 ug/dL — ABNORMAL LOW (ref 250–450)
UIBC: 72 ug/dL

## 2018-10-17 LAB — PREPARE RBC (CROSSMATCH)

## 2018-10-17 LAB — GLUCOSE, CAPILLARY
Glucose-Capillary: 120 mg/dL — ABNORMAL HIGH (ref 70–99)
Glucose-Capillary: 126 mg/dL — ABNORMAL HIGH (ref 70–99)
Glucose-Capillary: 144 mg/dL — ABNORMAL HIGH (ref 70–99)
Glucose-Capillary: 103 mg/dL — ABNORMAL HIGH (ref 70–99)
Glucose-Capillary: 177 mg/dL — ABNORMAL HIGH (ref 70–99)

## 2018-10-17 LAB — RENAL FUNCTION PANEL
Albumin: 2.4 g/dL — ABNORMAL LOW (ref 3.5–5.0)
Anion gap: 10 (ref 5–15)
BUN: 96 mg/dL — ABNORMAL HIGH (ref 6–20)
CO2: 23 mmol/L (ref 22–32)
Calcium: 8.1 mg/dL — ABNORMAL LOW (ref 8.9–10.3)
Chloride: 100 mmol/L (ref 98–111)
Creatinine, Ser: 7.41 mg/dL — ABNORMAL HIGH (ref 0.44–1.00)
GFR calc Af Amer: 7 mL/min — ABNORMAL LOW (ref >60)
GFR calc non Af Amer: 6 mL/min — ABNORMAL LOW (ref >60)
Glucose, Bld: 113 mg/dL — ABNORMAL HIGH (ref 70–99)
Phosphorus: 4.4 mg/dL (ref 2.5–4.6)
Potassium: 5.7 mmol/L — ABNORMAL HIGH (ref 3.5–5.1)
Sodium: 133 mmol/L — ABNORMAL LOW (ref 135–145)

## 2018-10-17 LAB — CBC
HCT: 21.2 % — ABNORMAL LOW (ref 36.0–46.0)
Hemoglobin: 6.6 g/dL — CL (ref 12.0–15.0)
MCH: 29.3 pg (ref 26.0–34.0)
MCHC: 31.1 g/dL (ref 30.0–36.0)
MCV: 94.2 fL (ref 80.0–100.0)
Platelets: 306 10*3/uL (ref 150–400)
RBC: 2.25 MIL/uL — ABNORMAL LOW (ref 3.87–5.11)
RDW: 15.8 % — ABNORMAL HIGH (ref 11.5–15.5)
WBC: 9.4 10*3/uL (ref 4.0–10.5)
nRBC: 0 % (ref 0.0–0.2)

## 2018-10-17 LAB — HEMOGLOBIN AND HEMATOCRIT, BLOOD
HCT: 36.9 % (ref 36.0–46.0)
HCT: 26.1 % — ABNORMAL LOW (ref 36.0–46.0)
Hemoglobin: 12.4 g/dL (ref 12.0–15.0)
Hemoglobin: 8.4 g/dL — ABNORMAL LOW (ref 12.0–15.0)

## 2018-10-17 MED ORDER — FUROSEMIDE 10 MG/ML IJ SOLN
40.0000 mg | Freq: Once | INTRAMUSCULAR | Status: AC
  Administered 2018-10-17: 40 mg via INTRAVENOUS
  Filled 2018-10-17: qty 4

## 2018-10-17 MED ORDER — FENTANYL CITRATE (PF) 100 MCG/2ML IJ SOLN
50.0000 ug | INTRAMUSCULAR | Status: DC | PRN
  Administered 2018-10-18: 50 ug via INTRAVENOUS
  Filled 2018-10-17: qty 2

## 2018-10-17 MED ORDER — INSULIN ASPART 100 UNIT/ML ~~LOC~~ SOLN
0.0000 [IU] | Freq: Three times a day (TID) | SUBCUTANEOUS | Status: DC
  Administered 2018-10-17 – 2018-10-18 (×3): 1 [IU] via SUBCUTANEOUS
  Administered 2018-10-19 – 2018-10-20 (×2): 2 [IU] via SUBCUTANEOUS
  Administered 2018-10-21: 1 [IU] via SUBCUTANEOUS

## 2018-10-17 MED ORDER — SODIUM CHLORIDE 0.9% IV SOLUTION
Freq: Once | INTRAVENOUS | Status: AC
  Administered 2018-10-17: 06:00:00 via INTRAVENOUS

## 2018-10-17 MED ORDER — INSULIN GLARGINE 100 UNIT/ML ~~LOC~~ SOLN
5.0000 [IU] | Freq: Every day | SUBCUTANEOUS | Status: DC
  Administered 2018-10-17 – 2018-10-20 (×4): 5 [IU] via SUBCUTANEOUS
  Filled 2018-10-17 (×5): qty 0.05

## 2018-10-17 NOTE — Plan of Care (Signed)
  Problem: Safety: Goal: Ability to remain free from injury will improve Outcome: Progressing   Problem: Education: Goal: Knowledge of the prescribed therapeutic regimen will improve Outcome: Progressing Goal: Ability to verbalize activity precautions or restrictions will improve Outcome: Progressing Goal: Understanding of discharge needs will improve Outcome: Progressing   Problem: Activity: Goal: Ability to perform//tolerate increased activity and mobilize with assistive devices will improve Outcome: Progressing   Problem: Clinical Measurements: Goal: Postoperative complications will be avoided or minimized Outcome: Progressing   Problem: Self-Care: Goal: Ability to meet self-care needs will improve Outcome: Progressing   Problem: Self-Concept: Goal: Ability to maintain and perform role responsibilities to the fullest extent possible will improve Outcome: Progressing   Problem: Pain Management: Goal: Pain level will decrease with appropriate interventions Outcome: Progressing   Problem: Skin Integrity: Goal: Demonstration of wound healing without infection will improve Outcome: Progressing

## 2018-10-17 NOTE — Progress Notes (Signed)
VAST RN consulted for "just in case" IV. VAST RN called pt's unit nurse and educated about not placing IV's just in case. Pt currently has a working IV and is receiving blood transfusion. Explained that IVT reads comments with consults and if current IV stops working during transfusion, RN should state that in comments and IV placement will be expedited. Unit RN verbalized understanding.

## 2018-10-17 NOTE — H&P (View-Only) (Signed)
ORTHOPAEDIC CONSULTATION  REQUESTING PHYSICIAN: Annia Belt, MD  Chief Complaint: Traumatic dehiscence transtibial amputation.  HPI: Kathy Frank is a 43 y.o. female who presents with traumatic dehiscence transtibial amputation.  Patient states she was discharged to home she was not wearing her stump protector she fell getting into the house sustained a traumatic dehiscence of her transtibial amputation.  Past Medical History:  Diagnosis Date  . CKD (chronic kidney disease) stage 5, GFR less than 15 ml/min (HCC) 06/19/2016   06/30/16: Seen by Dr. Posey Pronto [Nephrology]. Suspect etiologies to include diabetes and hypertension though cannot exclude HIV-associated nephropathy. Advised her against from using excessive Goody powder.  . Contact dermatitis 04/21/2016   07/29/16: Skin biopsy performed by [facility] notable for acute spongiotic dermatitis consistent with contact dermatitis.  . Cyclical vomiting    THC induced???  . Depression 06/28/2006   Qualifier: Diagnosis of  By: Riccardo Dubin MD, [place]    . Diabetes mellitus type 2 in obese ([place]) 06/28/1994  . DKA (diabetic ketoacidoses) ([place]) 12/2014  . Erosive esophagitis   . Esophageal reflux   . Essential hypertension 11/16/2013  . Gastroparesis    ? diabetic  . GERD (gastroesophageal reflux disease)   . Human immunodeficiency virus (HIV) disease ([place]) 04/23/2016  . Hypertension   . Moderate nonproliferative diabetic retinopathy of both eyes ([place]) 11/21/2014   11/14/14: Noted on retinal imaging; needs follow-up imaging in 6 months  05/22/16: Noted on retinal imaging again; needs follow-up imaging in 6 months  . Normocytic anemia   . TOBACCO USER 06/28/2006   Qualifier: Diagnosis of  By: Riccardo Dubin MD, Sherren Mocha    . Type II diabetes mellitus ([place]) 1994   diagnosed around 1994   Past Surgical History:  Procedure Laterality Date  . AMPUTATION Left 07/08/2018   Procedure: AMPUTATION FORTH RAY LEFT FOOT;  Surgeon: [name], MD;  Location: [facility];  Service: Orthopedics;  Laterality: Left;  . AMPUTATION Left 08/09/2018   Procedure: Left Transmetatarsal Amputation;  Surgeon: [name], MD;  Location: [facility];  Service: Orthopedics;  Laterality: Left;  . AMPUTATION Left 10/08/2018   Procedure: LEFT BELOW KNEE AMPUTATION;  Surgeon: [name], MD;  Location: [facility];  Service: Orthopedics;  Laterality: Left;  . AV FISTULA PLACEMENT Left 10/14/2018   Procedure: Arteriovenous (Av) Fistula Creation Left Arm;  Surgeon: [name], MD;  Location: [facility];  Service: Vascular;  Laterality: Left;  . LOWER EXTREMITY ANGIOGRAPHY N/A 07/05/2018   Procedure: LOWER EXTREMITY ANGIOGRAPHY;  Surgeon: Serafina Mitchell, MD;  Location: [facility];  Service: Cardiovascular;  Laterality: N/A;  . PERIPHERAL VASCULAR BALLOON ANGIOPLASTY Left 07/05/2018   Procedure: PERIPHERAL VASCULAR BALLOON ANGIOPLASTY;  Surgeon: Serafina Mitchell, MD;  Location: [facility];  Service: Cardiovascular;  Laterality: Left;  SFA  . TUBAL LIGATION  2002   Social History   Socioeconomic History  . Marital status: Single    Spouse name: Not on file  . Number of children: Not on file  . Years of education: 39  . Highest education level: Not on file  Occupational History    Employer: UNEMPLOYED  Social Needs  . Financial resource strain: Not on file  . Food insecurity:    Worry: Not on file    Inability: Not on file  . Transportation needs:    Medical: Not on file    Non-medical: Not on file  Tobacco Use  . Smoking status: Former  Smoker    Packs/day: 0.10    Years: 10.00    Pack years: 1.00    Types: Cigarettes    Start date: 11/17/2013    Last attempt to quit: 09/09/2014    Years since quitting: 4.1  . Smokeless tobacco: Never Used  Substance and Sexual Activity  . Alcohol use: Yes    Comment: Occasionally.  . Drug use: Yes    Frequency: 4.0 times per week    Types: Marijuana  . Sexual activity: Not on file    Lifestyle  . Physical activity:    Days per week: Not on file    Minutes per session: Not on file  . Stress: Not on file  Relationships  . Social connections:    Talks on phone: Not on file    Gets together: Not on file    Attends religious service: Not on file    Active member of club or organization: Not on file    Attends meetings of clubs or organizations: Not on file    Relationship status: Not on file  Other Topics Concern  . Not on file  Social History Narrative  . Not on file   Family History  Problem Relation Age of Onset  . Diabetes Mother   . Diabetes Brother   . Diabetes Daughter   . Diabetes Daughter   . Mental retardation Brother        died from PNA  . Diabetes Maternal Grandmother    - negative except otherwise stated in the family history section No Known Allergies Prior to Admission medications   Medication Sig Start Date End Date Taking? Authorizing Provider  amLODipine (NORVASC) 10 MG tablet Take 1 tablet (10 mg total) by mouth daily. 12/24/17 10/07/18  Collier Salina, MD  calcitRIOL (ROCALTROL) 0.25 MCG capsule Take 1 capsule (0.25 mcg total) by mouth daily. 10/17/18   [name], MD  calcium acetate (PHOSLO) 667 MG capsule Take 2 capsules (1,334 mg total) by mouth 3 (three) times daily with meals. 10/16/18   [name], MD  citalopram (CELEXA) 20 MG tablet Take 1 tablet (20 mg total) by mouth daily. 08/12/18 10/07/18  Carroll Sage, MD  Darbepoetin Alfa (ARANESP) 60 MCG/0.3ML SOSY injection Inject 0.3 mLs (60 mcg total) into the skin every Tuesday at 6 PM. 10/18/18   [name], MD  Dolutegravir-Rilpivirine (JULUCA) 50-25 MG TABS Take 1 tablet by mouth daily. Please take with largest meal of the day. 07/05/18   Annia Belt, MD  gabapentin (NEURONTIN) 300 MG capsule Take 1 capsule (300 mg total) by mouth daily. 10/17/18   [name], MD  glucose blood (ACCU-CHEK GUIDE) test strip USE TO TEST BLOOD SUGAR 3 TIMES  DAILY . Diagnosis code  E11.69, E66.9 07/09/17   Lars Mage, MD  Insulin Glargine (LANTUS) 100 UNIT/ML Solostar Pen Inject 5 Units into the skin at bedtime. 10/16/18   [name], MD  labetalol (NORMODYNE) 100 MG tablet TAKE 1 TABLET BY MOUTH 2 TIMES DAILY Patient taking differently: Take 100 mg by mouth 2 (two) times daily.  07/13/18   Chundi, Verne Spurr, MD  liraglutide (VICTOZA) 18 MG/3ML SOPN Inject 0.2 mLs (1.2 mg total) into the skin daily. 02/04/18   Chundi, Verne Spurr, MD  methocarbamol (ROBAXIN) 500 MG tablet Take 1 tablet (500 mg total) by mouth every 6 (six) hours as needed for muscle spasms. 10/16/18   [name], MD  nutrition supplement, JUVEN, (JUVEN) PACK Take 1 packet by mouth 2 (two) times  daily between meals. 10/16/18   [name], MD  ondansetron (ZOFRAN) 4 MG tablet Take 1 tablet (4 mg total) by mouth daily as needed for nausea or vomiting. 08/19/18 08/19/19  Alphonzo Grieve, MD  oxyCODONE (OXY IR/ROXICODONE) 5 MG immediate release tablet Take 1 tablet (5 mg total) by mouth every 6 (six) hours as needed for up to 5 days for moderate pain or severe pain. 10/16/18 10/21/18  [name], MD  polyethylene glycol (MIRALAX / Floria Raveling) packet Take 17 g by mouth daily. Patient taking differently: Take 17 g by mouth daily as needed for mild constipation.  08/12/18   Carroll Sage, MD  senna (SENOKOT) 8.6 MG TABS tablet Take 2 tablets (17.2 mg total) by mouth 2 (two) times daily. Patient taking differently: Take 2 tablets by mouth daily as needed for mild constipation.  08/12/18   Carroll Sage, MD  sodium bicarbonate 650 MG tablet Take 2 tablets (1,300 mg total) by mouth 2 (two) times daily. 10/16/18   [name], MD  sodium zirconium cyclosilicate (LOKELMA) 10 g PACK packet Take 10 g by mouth 3 (three) times daily. 10/16/18   [name], MD  sulfamethoxazole-trimethoprim (BACTRIM DS,SEPTRA DS) 800-160 MG tablet Take 1 tablet by mouth 2 (two) times  daily. Patient not taking: Reported on 10/07/2018 09/15/18   Rayburn, Neta Mends, PA-C   Dg Knee 2 Views Left  Result Date: 10/16/2018 CLINICAL DATA:  Amputation.  Fall. EXAM: LEFT KNEE - 1-2 VIEW COMPARISON:  None. FINDINGS: Status post amputation. The skin staples appear displaced consistent with the history of an open wound. No acute fracture. No other acute abnormalities. IMPRESSION: Status post amputation. No acute bony abnormality. Opened wound per history. Electronically Signed   By: Dorise Bullion III M.D   On: 10/16/2018 20:05   - pertinent xrays, CT, MRI studies were reviewed and independently interpreted  Positive ROS: All other systems have been reviewed and were otherwise negative with the exception of those mentioned in the HPI and as above.  Physical Exam: General: Alert, no acute distress Psychiatric: Patient is competent for consent with normal mood and affect Lymphatic: No axillary or cervical lymphadenopathy Cardiovascular: No pedal edema Respiratory: No cyanosis, no use of accessory musculature GI: No organomegaly, abdomen is soft and non-tender    Images:  @ENCIMAGES @  Labs:  Lab Results  Component Value Date   HGBA1C 6.5 (H) 08/06/2018   HGBA1C 7.9 (A) 02/04/2018   HGBA1C 9.0 (H) 09/13/2017   ESRSEDRATE 21 08/06/2018   ESRSEDRATE 85 (H) 02/02/2017   CRP <0.8 10/07/2018   CRP <0.8 08/06/2018   CRP 42.4 (H) 02/02/2017   REPTSTATUS 10/12/2018 FINAL 10/07/2018   REPTSTATUS 10/09/2018 FINAL 10/07/2018   GRAMSTAIN  10/07/2018    NO WBC SEEN RARE GRAM POSITIVE COCCI IN PAIRS FEW GRAM POSITIVE RODS    CULT  10/07/2018    NO GROWTH 5 DAYS Performed at [facility], [place] [address] [place], [place] [address]    CULT  10/07/2018    NORMAL SKIN FLORA Performed at [facility], [place] [address], [place], [place] [address]    [place] 07/08/2018    Lab Results  Component Value Date   ALBUMIN 2.4 (L) 10/17/2018    ALBUMIN 2.7 (L) 10/16/2018   ALBUMIN 2.4 (L) 10/16/2018    Neurologic: Patient does not have protective sensation bilateral lower extremities.   MUSCULOSKELETAL:   Skin: Examination patient has complete dehiscence of the surgical incision.  Patient  has no other injuries.  Assessment: Assessment: Traumatic dehiscence transtibial amputation.  Plan: Plan: Will plan for revision of the amputation either a short below the knee amputation or an above-the-knee amputation depending upon the tissue viability.  Plan for surgery Wednesday morning.  Thank you for the consult and the opportunity to see Kathy Frank, [place] [phone] 6:55 AM

## 2018-10-17 NOTE — Progress Notes (Signed)
Subjective: Kathy Frank was disappointed to be back in the hospital. Having significant pain at her amputation site from falling. She has not noticed any bleeding since the wound was wrapped in the emergency room. Otherwise, she is feeling ok. She has family coming into town that will be able to help her more at home. All questions and concerns were addressed.   Objective:  Vital signs in last 24 hours: Vitals:   10/17/18 0126 10/17/18 0127 10/17/18 0526 10/17/18 0615  BP:  98/61 125/71 112/65  Pulse: 81 81 91 89  Resp:  17 17 17   Temp: 98 F (36.7 C) 98 F (36.7 C) 98.7 F (37.1 C) 99 F (37.2 C)  TempSrc: Oral Oral Oral Oral  SpO2:   100% 97%  Weight:  81.7 kg    Height:  5\' 5"  (1.651 m)     General: awake, alert, sitting up in bed in NAD CV: RRR; no murmurs Pulm: normal respiratory effort; lungs CTAB Ext: Left BKA dressing clean, dry intact   Assessment/Plan:  Principal Problem:   BKA stump complication (HCC) Active Problems:   Diabetes mellitus type 2 in obese (HCC)   Essential hypertension   Human immunodeficiency virus (HIV) disease (HCC)   CKD (chronic kidney disease), stage V ([place])  Kathy Frank is a 43 y.o. female with HIV, DM, CKD5, HTN and recent left BKA on 10/08/2018 who presented with a mechanical fall the day she was discharged from the hospital. Her left BKA surgical site was found to be open and she was admitted for revision with ortho.  Left BKA Stump Wound Dehiscence  - No evidence of blood on the bandage that was placed last night. However, her drop in Hb from 8.3 to 6.6 this morning is likely the result of bleeding from this wound. Plan - Ortho following, appreciate recs. Revision scheduled for 3/11. 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 Likely combination of anemia 2/2 CKD as well as acute blood loss from her surgical site. Patient also has a history of heavy menses that have caused anemia requiring blood transfusions during past hospitalizations. - Hb decreased from 8.3 on admission to 6.6 this morning.  Plan - Give 2 units pRBCs - Administer IV lasix 40mg  between the two transfusions   - Monitor post-transfusion Hb. Transfusion goal Hb > 7 - Continue EPO injections and IV iron per nephrology  CKD stage V Hyperkalemia - s/p left brachiobasilic AVF on 09/32/6712  - During previous hospitalization, patient declined central line placement to initiate HD. As she has not been showing any signs of uremia or volume overload, the plan was to f/u as an outpatient with nephrology and allow time for graft maturation.  - She was discharged home with [facility], although there was concern that the patient would not be able to get this medication. She did not go to the pharmacy between discharge and admission, so she's not sure if she'd be able to get it.  - Today, Cr has increased to 7.4, BUN is 96, and K is 5.7 Plan - Nephrology consulted, appreciate recs - Continue Lokelma 10g TID - Continue calcitriol, PhosLo, and sodium bicarb per nephro  Type 2 DM - Blood sugars within goal Plan - Lantus 5u qhs - SSI  Hypertension  - BP well-controlled on current regimen Plan - Continue home Amlodipine 10 mg QD and Labetalol 100 mg BID  HIV - Continue Dolutegravir-Rilpivirine   Dispo: Anticipated discharge in 3-4 days.  Bentzion Dauria, [name], MD 10/17/2018, 6:47 AM Pager: [phone]

## 2018-10-17 NOTE — Progress Notes (Signed)
[facility] ROUNDING NOTE   Subjective:   Is a 43 year old female poorly controlled diabetes hypertension HIV with a CD4 count of 450 she has CKD stage V followed with Dr. Posey Pronto noncompliant.  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  She is poorly controlled diabetes hypertension serum creatinine appears about 6 to 7 mg/dL with mild hyperkalemia.  She is status post first stage basilic vein transposition by Dr. Carlis Abbott 10/14/2018.  She was discharged 10/16/2018 but readmitted later that evening after she fell with a wound dehiscence of her BKA  Blood pressure 112/71 pulse 91 temperature 99 O2 sats 99% room air   Sodium 133 potassium 5.7 chloride 100 CO2 23 BUN 96 creatinine 7.41 calcium 8.1 albumin 2.4 WBC 9.4 hemoglobin 6.6 platelets 306   Tivicay 50 mg daily and edurant 25 mg daily amlodipine 10 mg daily labetalol 100 mg twice daily Celexa 20 mg a day Calcitrol 0.25 mcg daily, glargine 5 units daily PhosLo 1334 mg 3 times daily, sodium bicarbonate 2 tablets daily,      Objective:  Vital signs in last 24 hours:  Temp:  [97.7 F (36.5 C)-99.4 F (37.4 C)] 99 F (37.2 C) (03/09 1055) Pulse Rate:  [77-93] 91 (03/09 1055) Resp:  [12-23] 12 (03/09 1055) BP: (96-140)/(61-82) 112/71 (03/09 1055) SpO2:  [95 %-100 %] 100 % (03/09 1055) Weight:  [81.7 kg] 81.7 kg (03/09 0127)  Weight change:  Filed Weights   10/17/18 0127  Weight: 81.7 kg    Intake/Output: I/O last 3 completed shifts: In: -  Out: 400 [Urine:400]   Intake/Output this shift:  Total I/O In: 906 [P.O.:906] Out: 600 [Urine:600]  CVS- RRR no murmurs rubs gallops RS- CTA clear with no wheezes or rales ABD- BS present soft non-distended EXT- no edema BKA stump dressing left AV fistula good thrill and bruit   Basic Metabolic Panel: Recent Labs  Lab 10/13/18 0318 10/14/18 0216 10/15/18 0401 10/16/18 0549 10/16/18 1940 10/17/18 0247  NA 135 133* 132* 135 135 133*  K 5.3* 5.9* 5.7* 5.1 5.8* 5.7*  CL  102 103 99 103 101 100  CO2 22 22 20* 23 23 23   GLUCOSE 93 119* 125* 103* 97 113*  BUN 45* 50* 65* 78* 89* 96*  CREATININE 7.12* 7.08* 6.98* 6.94* 7.19* 7.41*  CALCIUM 7.9* 8.1* 8.2* 8.1* 8.4* 8.1*  PHOS 6.2* 5.6* 4.3 4.6  --  4.4    Liver Function Tests: Recent Labs  Lab 10/14/18 0216 10/15/18 0401 10/16/18 0549 10/16/18 1940 10/17/18 0247  AST  --   --   --  15  --   ALT  --   --   --  6  --   ALKPHOS  --   --   --  79  --   BILITOT  --   --   --  0.5  --   PROT  --   --   --  7.0  --   ALBUMIN 2.6* 3.0* 2.4* 2.7* 2.4*   No results for input(s): LIPASE, AMYLASE in the last 168 hours. No results for input(s): AMMONIA in the last 168 hours.  CBC: Recent Labs  Lab 10/14/18 0216 10/15/18 0401 10/16/18 0549 10/16/18 1940 10/17/18 0247  WBC 7.4 9.4 7.0 9.3 9.4  NEUTROABS  --   --   --  6.6  --   HGB 6.9* 9.1* 8.8* 8.3* 6.6*  HCT 22.1* 28.3* 27.9* 26.3* 21.2*  MCV 90.9 90.7 91.8 94.6 94.2  PLT 286 328  312 354 306    Cardiac Enzymes: No results for input(s): CKTOTAL, CKMB, CKMBINDEX, TROPONINI in the last 168 hours.  BNP: Invalid input(s): POCBNP  CBG: Recent Labs  Lab 10/16/18 0657 10/16/18 0810 10/16/18 1153 10/16/18 2233 10/17/18 0754  GLUCAP 100* 84 107* 123* 126*    Microbiology: Results for orders placed or performed during the hospital encounter of 10/06/18  Culture, blood (Routine X 2) w Reflex to ID Panel     Status: None   Collection Time: 10/07/18  1:10 AM  Result Value Ref Range Status   Specimen Description BLOOD RIGHT ARM  Final   Special Requests   Final    BOTTLES DRAWN AEROBIC AND ANAEROBIC Blood Culture adequate volume   Culture   Final    NO GROWTH 5 DAYS Performed at [facility], [place] [address], [place], [place] [address]    Report Status 10/12/2018 FINAL  Final  Culture, blood (Routine X 2) w Reflex to ID Panel     Status: None   Collection Time: 10/07/18  2:11 AM  Result Value Ref Range Status   Specimen Description  BLOOD RIGHT WRIST  Final   Special Requests   Final    BOTTLES DRAWN AEROBIC AND ANAEROBIC Blood Culture adequate volume   Culture   Final    NO GROWTH 5 DAYS Performed at [facility], [place] [address], [place], [place] [address]    Report Status 10/12/2018 FINAL  Final  Wound or Superficial Culture     Status: None   Collection Time: 10/07/18  2:11 AM  Result Value Ref Range Status   Specimen Description WOUND FOOT  Final   Special Requests NONE  Final   Gram Stain   Final    NO WBC SEEN RARE GRAM POSITIVE COCCI IN PAIRS FEW GRAM POSITIVE RODS    Culture   Final    NORMAL SKIN FLORA Performed at [facility], [place] [address], [place], [place] [address]    Report Status 10/09/2018 FINAL  Final  Surgical PCR screen     Status: None   Collection Time: 10/07/18  8:46 PM  Result Value Ref Range Status   MRSA, PCR NEGATIVE NEGATIVE Final   Staphylococcus aureus NEGATIVE NEGATIVE Final    Comment: (NOTE) The Xpert SA Assay (FDA approved for NASAL specimens in patients 48 years of age and older), is one component of a comprehensive surveillance program. It is not intended to diagnose infection nor to guide or monitor treatment. Performed at [facility], [place] [address], [place], [place] [address]     Coagulation Studies: No results for input(s): LABPROT, INR in the last 72 hours.  Urinalysis: No results for input(s): COLORURINE, LABSPEC, PHURINE, GLUCOSEU, HGBUR, BILIRUBINUR, KETONESUR, PROTEINUR, UROBILINOGEN, NITRITE, LEUKOCYTESUR in the last 72 hours.  Invalid input(s): APPERANCEUR    Imaging: Dg Knee 2 Views Left  Result Date: 10/16/2018 CLINICAL DATA:  Amputation.  Fall. EXAM: LEFT KNEE - 1-2 VIEW COMPARISON:  None. FINDINGS: Status post amputation. The skin staples appear displaced consistent with the history of an open wound. No acute fracture. No other acute abnormalities. IMPRESSION: Status post amputation. No acute bony abnormality. Opened wound per  history. Electronically Signed   By: Dorise Bullion III M.D   On: 10/16/2018 20:05     Medications:    . amLODipine  10 mg Oral Daily  . calcitRIOL  0.25 mcg Oral Daily  . calcium acetate  1,334 mg Oral  TID WC  . citalopram  20 mg Oral Daily  . dolutegravir  50 mg Oral Daily   And  . rilpivirine  25 mg Oral Q breakfast  . gabapentin  300 mg Oral Daily  . insulin aspart  0-9 Units Subcutaneous TID WC  . insulin glargine  5 Units Subcutaneous QHS  . labetalol  100 mg Oral BID  . polyethylene glycol  17 g Oral Daily  . senna  2 tablet Oral BID  . sodium bicarbonate  1,300 mg Oral BID  . sodium zirconium cyclosilicate  10 g Oral TID   fentaNYL (SUBLIMAZE) injection, HYDROmorphone (DILAUDID) injection, methocarbamol, ondansetron **OR** ondansetron (ZOFRAN) IV  Assessment/ Plan:   CKD stage V status post AV fistula creation 10/14/2018.  Very advanced renal failure secondary diabetes hypertension HIV.  I would think that we need to have a decision about whether to proceed with dialysis.  She will need a tunneled dialysis catheter.  Anemia we will check iron studies evaluate for darbepoetin  Hypertension appears to be under adequate control  Depression appears to be stable\  HIV continue current medications  Metabolic acidosis continue sodium bicarbonate  Hyperkalemia agree with Lokelma 10 mg 3 times daily continue to follow potassium low potassium diet  Bones continue calcitriol 0.25 mcg daily and calcium acetate 1334 mg 3 times daily   LOS: 1 Sherril Croon @TODAY @11 :57 AM

## 2018-10-17 NOTE — Consult Note (Signed)
ORTHOPAEDIC CONSULTATION  REQUESTING PHYSICIAN: Annia Belt, MD  Chief Complaint: Traumatic dehiscence transtibial amputation.  HPI: Kathy Frank is a 43 y.o. female who presents with traumatic dehiscence transtibial amputation.  Patient states she was discharged to home she was not wearing her stump protector she fell getting into the house sustained a traumatic dehiscence of her transtibial amputation.  Past Medical History:  Diagnosis Date  . CKD (chronic kidney disease) stage 5, GFR less than 15 ml/min (HCC) 06/19/2016   06/30/16: Seen by Dr. Posey Pronto [Nephrology]. Suspect etiologies to include diabetes and hypertension though cannot exclude HIV-associated nephropathy. Advised her against from using excessive Goody powder.  . Contact dermatitis 04/21/2016   07/29/16: Skin biopsy performed by [facility] notable for acute spongiotic dermatitis consistent with contact dermatitis.  . Cyclical vomiting    THC induced???  . Depression 06/28/2006   Qualifier: Diagnosis of  By: Riccardo Dubin MD, [place]    . Diabetes mellitus type 2 in obese ([place]) 06/28/1994  . DKA (diabetic ketoacidoses) ([place]) 12/2014  . Erosive esophagitis   . Esophageal reflux   . Essential hypertension 11/16/2013  . Gastroparesis    ? diabetic  . GERD (gastroesophageal reflux disease)   . Human immunodeficiency virus (HIV) disease ([place]) 04/23/2016  . Hypertension   . Moderate nonproliferative diabetic retinopathy of both eyes ([place]) 11/21/2014   11/14/14: Noted on retinal imaging; needs follow-up imaging in 6 months  05/22/16: Noted on retinal imaging again; needs follow-up imaging in 6 months  . Normocytic anemia   . TOBACCO USER 06/28/2006   Qualifier: Diagnosis of  By: Riccardo Dubin MD, Sherren Mocha    . Type II diabetes mellitus ([place]) 1994   diagnosed around 1994   Past Surgical History:  Procedure Laterality Date  . AMPUTATION Left 07/08/2018   Procedure: AMPUTATION FORTH RAY LEFT FOOT;  Surgeon: [name], MD;  Location: [facility];  Service: Orthopedics;  Laterality: Left;  . AMPUTATION Left 08/09/2018   Procedure: Left Transmetatarsal Amputation;  Surgeon: [name], MD;  Location: [facility];  Service: Orthopedics;  Laterality: Left;  . AMPUTATION Left 10/08/2018   Procedure: LEFT BELOW KNEE AMPUTATION;  Surgeon: [name], MD;  Location: [facility];  Service: Orthopedics;  Laterality: Left;  . AV FISTULA PLACEMENT Left 10/14/2018   Procedure: Arteriovenous (Av) Fistula Creation Left Arm;  Surgeon: [name], MD;  Location: [facility];  Service: Vascular;  Laterality: Left;  . LOWER EXTREMITY ANGIOGRAPHY N/A 07/05/2018   Procedure: LOWER EXTREMITY ANGIOGRAPHY;  Surgeon: Serafina Mitchell, MD;  Location: [facility];  Service: Cardiovascular;  Laterality: N/A;  . PERIPHERAL VASCULAR BALLOON ANGIOPLASTY Left 07/05/2018   Procedure: PERIPHERAL VASCULAR BALLOON ANGIOPLASTY;  Surgeon: Serafina Mitchell, MD;  Location: [facility];  Service: Cardiovascular;  Laterality: Left;  SFA  . TUBAL LIGATION  2002   Social History   Socioeconomic History  . Marital status: Single    Spouse name: Not on file  . Number of children: Not on file  . Years of education: 73  . Highest education level: Not on file  Occupational History    Employer: UNEMPLOYED  Social Needs  . Financial resource strain: Not on file  . Food insecurity:    Worry: Not on file    Inability: Not on file  . Transportation needs:    Medical: Not on file    Non-medical: Not on file  Tobacco Use  . Smoking status: Former  Smoker    Packs/day: 0.10    Years: 10.00    Pack years: 1.00    Types: Cigarettes    Start date: 11/17/2013    Last attempt to quit: 09/09/2014    Years since quitting: 4.1  . Smokeless tobacco: Never Used  Substance and Sexual Activity  . Alcohol use: Yes    Comment: Occasionally.  . Drug use: Yes    Frequency: 4.0 times per week    Types: Marijuana  . Sexual activity: Not on file    Lifestyle  . Physical activity:    Days per week: Not on file    Minutes per session: Not on file  . Stress: Not on file  Relationships  . Social connections:    Talks on phone: Not on file    Gets together: Not on file    Attends religious service: Not on file    Active member of club or organization: Not on file    Attends meetings of clubs or organizations: Not on file    Relationship status: Not on file  Other Topics Concern  . Not on file  Social History Narrative  . Not on file   Family History  Problem Relation Age of Onset  . Diabetes Mother   . Diabetes Brother   . Diabetes Daughter   . Diabetes Daughter   . Mental retardation Brother        died from PNA  . Diabetes Maternal Grandmother    - negative except otherwise stated in the family history section No Known Allergies Prior to Admission medications   Medication Sig Start Date End Date Taking? Authorizing Provider  amLODipine (NORVASC) 10 MG tablet Take 1 tablet (10 mg total) by mouth daily. 12/24/17 10/07/18  Collier Salina, MD  calcitRIOL (ROCALTROL) 0.25 MCG capsule Take 1 capsule (0.25 mcg total) by mouth daily. 10/17/18   [name], MD  calcium acetate (PHOSLO) 667 MG capsule Take 2 capsules (1,334 mg total) by mouth 3 (three) times daily with meals. 10/16/18   [name], MD  citalopram (CELEXA) 20 MG tablet Take 1 tablet (20 mg total) by mouth daily. 08/12/18 10/07/18  Carroll Sage, MD  Darbepoetin Alfa (ARANESP) 60 MCG/0.3ML SOSY injection Inject 0.3 mLs (60 mcg total) into the skin every Tuesday at 6 PM. 10/18/18   [name], MD  Dolutegravir-Rilpivirine (JULUCA) 50-25 MG TABS Take 1 tablet by mouth daily. Please take with largest meal of the day. 07/05/18   Annia Belt, MD  gabapentin (NEURONTIN) 300 MG capsule Take 1 capsule (300 mg total) by mouth daily. 10/17/18   [name], MD  glucose blood (ACCU-CHEK GUIDE) test strip USE TO TEST BLOOD SUGAR 3 TIMES  DAILY . Diagnosis code  E11.69, E66.9 07/09/17   Lars Mage, MD  Insulin Glargine (LANTUS) 100 UNIT/ML Solostar Pen Inject 5 Units into the skin at bedtime. 10/16/18   [name], MD  labetalol (NORMODYNE) 100 MG tablet TAKE 1 TABLET BY MOUTH 2 TIMES DAILY Patient taking differently: Take 100 mg by mouth 2 (two) times daily.  07/13/18   Chundi, Verne Spurr, MD  liraglutide (VICTOZA) 18 MG/3ML SOPN Inject 0.2 mLs (1.2 mg total) into the skin daily. 02/04/18   Chundi, Verne Spurr, MD  methocarbamol (ROBAXIN) 500 MG tablet Take 1 tablet (500 mg total) by mouth every 6 (six) hours as needed for muscle spasms. 10/16/18   [name], MD  nutrition supplement, JUVEN, (JUVEN) PACK Take 1 packet by mouth 2 (two) times  daily between meals. 10/16/18   [name], MD  ondansetron (ZOFRAN) 4 MG tablet Take 1 tablet (4 mg total) by mouth daily as needed for nausea or vomiting. 08/19/18 08/19/19  Alphonzo Grieve, MD  oxyCODONE (OXY IR/ROXICODONE) 5 MG immediate release tablet Take 1 tablet (5 mg total) by mouth every 6 (six) hours as needed for up to 5 days for moderate pain or severe pain. 10/16/18 10/21/18  [name], MD  polyethylene glycol (MIRALAX / Floria Raveling) packet Take 17 g by mouth daily. Patient taking differently: Take 17 g by mouth daily as needed for mild constipation.  08/12/18   Carroll Sage, MD  senna (SENOKOT) 8.6 MG TABS tablet Take 2 tablets (17.2 mg total) by mouth 2 (two) times daily. Patient taking differently: Take 2 tablets by mouth daily as needed for mild constipation.  08/12/18   Carroll Sage, MD  sodium bicarbonate 650 MG tablet Take 2 tablets (1,300 mg total) by mouth 2 (two) times daily. 10/16/18   [name], MD  sodium zirconium cyclosilicate (LOKELMA) 10 g PACK packet Take 10 g by mouth 3 (three) times daily. 10/16/18   [name], MD  sulfamethoxazole-trimethoprim (BACTRIM DS,SEPTRA DS) 800-160 MG tablet Take 1 tablet by mouth 2 (two) times  daily. Patient not taking: Reported on 10/07/2018 09/15/18   Rayburn, Neta Mends, PA-C   Dg Knee 2 Views Left  Result Date: 10/16/2018 CLINICAL DATA:  Amputation.  Fall. EXAM: LEFT KNEE - 1-2 VIEW COMPARISON:  None. FINDINGS: Status post amputation. The skin staples appear displaced consistent with the history of an open wound. No acute fracture. No other acute abnormalities. IMPRESSION: Status post amputation. No acute bony abnormality. Opened wound per history. Electronically Signed   By: Dorise Bullion III M.D   On: 10/16/2018 20:05   - pertinent xrays, CT, MRI studies were reviewed and independently interpreted  Positive ROS: All other systems have been reviewed and were otherwise negative with the exception of those mentioned in the HPI and as above.  Physical Exam: General: Alert, no acute distress Psychiatric: Patient is competent for consent with normal mood and affect Lymphatic: No axillary or cervical lymphadenopathy Cardiovascular: No pedal edema Respiratory: No cyanosis, no use of accessory musculature GI: No organomegaly, abdomen is soft and non-tender    Images:  @ENCIMAGES @  Labs:  Lab Results  Component Value Date   HGBA1C 6.5 (H) 08/06/2018   HGBA1C 7.9 (A) 02/04/2018   HGBA1C 9.0 (H) 09/13/2017   ESRSEDRATE 21 08/06/2018   ESRSEDRATE 85 (H) 02/02/2017   CRP <0.8 10/07/2018   CRP <0.8 08/06/2018   CRP 42.4 (H) 02/02/2017   REPTSTATUS 10/12/2018 FINAL 10/07/2018   REPTSTATUS 10/09/2018 FINAL 10/07/2018   GRAMSTAIN  10/07/2018    NO WBC SEEN RARE GRAM POSITIVE COCCI IN PAIRS FEW GRAM POSITIVE RODS    CULT  10/07/2018    NO GROWTH 5 DAYS Performed at [facility], [place] [address] [place], [place] [address]    CULT  10/07/2018    NORMAL SKIN FLORA Performed at [facility], [place] [address] [place], [place] [address]    [place] 07/08/2018    Lab Results  Component Value Date   ALBUMIN 2.4 (L) 10/17/2018    ALBUMIN 2.7 (L) 10/16/2018   ALBUMIN 2.4 (L) 10/16/2018    Neurologic: Patient does not have protective sensation bilateral lower extremities.   MUSCULOSKELETAL:   Skin: Examination patient has complete dehiscence of the surgical incision.  Patient  has no other injuries.  Assessment: Assessment: Traumatic dehiscence transtibial amputation.  Plan: Plan: Will plan for revision of the amputation either a short below the knee amputation or an above-the-knee amputation depending upon the tissue viability.  Plan for surgery Wednesday morning.  Thank you for the consult and the opportunity to see Kathy Frank, [place] [phone] 6:55 AM

## 2018-10-17 NOTE — Progress Notes (Signed)
Notified Dr Tarri Abernethy, on call for IM of patient's critical lab values. Hgb 6.6 and potassium 5.7. Orders received to transfuse 2 units.

## 2018-10-18 ENCOUNTER — Telehealth: Payer: Self-pay | Admitting: Vascular Surgery

## 2018-10-18 ENCOUNTER — Encounter (HOSPITAL_COMMUNITY): Payer: Self-pay | Admitting: *Deleted

## 2018-10-18 ENCOUNTER — Ambulatory Visit (INDEPENDENT_AMBULATORY_CARE_PROVIDER_SITE_OTHER): Payer: Self-pay | Admitting: Physician Assistant

## 2018-10-18 LAB — TYPE AND SCREEN
ABO/RH(D): B POS
Antibody Screen: NEGATIVE
Unit division: 0
Unit division: 0

## 2018-10-18 LAB — RENAL FUNCTION PANEL
Albumin: 2.4 g/dL — ABNORMAL LOW (ref 3.5–5.0)
Anion gap: 11 (ref 5–15)
BUN: 92 mg/dL — ABNORMAL HIGH (ref 6–20)
CO2: 24 mmol/L (ref 22–32)
Calcium: 8.3 mg/dL — ABNORMAL LOW (ref 8.9–10.3)
Chloride: 97 mmol/L — ABNORMAL LOW (ref 98–111)
Creatinine, Ser: 7.47 mg/dL — ABNORMAL HIGH (ref 0.44–1.00)
GFR calc Af Amer: 7 mL/min — ABNORMAL LOW (ref >60)
GFR calc non Af Amer: 6 mL/min — ABNORMAL LOW (ref >60)
Glucose, Bld: 148 mg/dL — ABNORMAL HIGH (ref 70–99)
Phosphorus: 4.5 mg/dL (ref 2.5–4.6)
Potassium: 4.7 mmol/L (ref 3.5–5.1)
Sodium: 132 mmol/L — ABNORMAL LOW (ref 135–145)

## 2018-10-18 LAB — BPAM RBC
Blood Product Expiration Date: 202004022359
Blood Product Expiration Date: 202004022359
ISSUE DATE / TIME: 202003090528
ISSUE DATE / TIME: 202003091032
Unit Type and Rh: 7300
Unit Type and Rh: 7300

## 2018-10-18 LAB — CBC
HCT: 25.8 % — ABNORMAL LOW (ref 36.0–46.0)
Hemoglobin: 8.5 g/dL — ABNORMAL LOW (ref 12.0–15.0)
MCH: 29.1 pg (ref 26.0–34.0)
MCHC: 32.9 g/dL (ref 30.0–36.0)
MCV: 88.4 fL (ref 80.0–100.0)
Platelets: 299 10*3/uL (ref 150–400)
RBC: 2.92 MIL/uL — ABNORMAL LOW (ref 3.87–5.11)
RDW: 16.4 % — ABNORMAL HIGH (ref 11.5–15.5)
WBC: 8 10*3/uL (ref 4.0–10.5)
nRBC: 0 % (ref 0.0–0.2)

## 2018-10-18 LAB — GLUCOSE, CAPILLARY
Glucose-Capillary: 126 mg/dL — ABNORMAL HIGH (ref 70–99)
Glucose-Capillary: 128 mg/dL — ABNORMAL HIGH (ref 70–99)
Glucose-Capillary: 114 mg/dL — ABNORMAL HIGH (ref 70–99)
Glucose-Capillary: 181 mg/dL — ABNORMAL HIGH (ref 70–99)

## 2018-10-18 MED ORDER — DARBEPOETIN ALFA 60 MCG/0.3ML IJ SOSY
60.0000 ug | PREFILLED_SYRINGE | INTRAMUSCULAR | Status: DC
  Administered 2018-10-18: 60 ug via SUBCUTANEOUS
  Filled 2018-10-18 (×2): qty 0.3

## 2018-10-18 NOTE — Progress Notes (Signed)
[facility] ROUNDING NOTE   Subjective:   Is a 43 year old female poorly controlled diabetes hypertension HIV with a CD4 count of 450 she has CKD stage V followed with Dr. Posey Pronto noncompliant.  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  She is poorly controlled diabetes hypertension serum creatinine appears about 6 to 7 mg/dL with mild hyperkalemia.  She is status post first stage basilic vein transposition by Dr. Carlis Abbott 10/14/2018.  She was discharged 10/16/2018 but readmitted later that evening after she fell with a wound dehiscence of her BKA  Blood pressure 140/68 pulse of 87 temperature 98.8 O2 sats 98% room air.   Sodium 132 potassium 4.7 chloride 97 CO2 24 BUN 92 creatinine 7.47 glucose 148 calcium 8.3 phosphorus 4.5 albumin 2.4 WBC 8.0 hemoglobin 8.5 platelets 09/17/1997   Tivicay 50 mg daily and edurant 25 mg daily amlodipine 10 mg daily labetalol 100 mg twice daily Celexa 20 mg a day Calcitrol 0.25 mcg daily, glargine 5 units daily PhosLo 1334 mg 3 times daily, sodium bicarbonate 2 tablets daily,      Objective:  Vital signs in last 24 hours:  Temp:  [98.5 F (36.9 C)-99.8 F (37.7 C)] 98.8 F (37.1 C) (03/10 0700) Pulse Rate:  [85-93] 87 (03/10 0700) Resp:  [12-20] 20 (03/10 0700) BP: (112-140)/(60-77) 140/68 (03/10 0700) SpO2:  [97 %-100 %] 98 % (03/10 0700)  Weight change:  Filed Weights   10/17/18 0127  Weight: 81.7 kg    Intake/Output: I/O last 3 completed shifts: In: 2774 [P.O.:1328] Out: 1000 [Urine:1000]   Intake/Output this shift:  No intake/output data recorded.  CVS- RRR no murmurs rubs gallops RS- CTA clear with no wheezes or rales ABD- BS present soft non-distended EXT- no edema BKA stump dressing left AV fistula good thrill and bruit   Basic Metabolic Panel: Recent Labs  Lab 10/14/18 0216 10/15/18 0401 10/16/18 0549 10/16/18 1940 10/17/18 0247 10/18/18 0200  NA 133* 132* 135 135 133* 132*  K 5.9* 5.7* 5.1 5.8* 5.7* 4.7  CL 103 99  103 101 100 97*  CO2 22 20* 23 23 23 24   GLUCOSE 119* 125* 103* 97 113* 148*  BUN 50* 65* 78* 89* 96* 92*  CREATININE 7.08* 6.98* 6.94* 7.19* 7.41* 7.47*  CALCIUM 8.1* 8.2* 8.1* 8.4* 8.1* 8.3*  PHOS 5.6* 4.3 4.6  --  4.4 4.5    Liver Function Tests: Recent Labs  Lab 10/15/18 0401 10/16/18 0549 10/16/18 1940 10/17/18 0247 10/18/18 0200  AST  --   --  15  --   --   ALT  --   --  6  --   --   ALKPHOS  --   --  79  --   --   BILITOT  --   --  0.5  --   --   PROT  --   --  7.0  --   --   ALBUMIN 3.0* 2.4* 2.7* 2.4* 2.4*   No results for input(s): LIPASE, AMYLASE in the last 168 hours. No results for input(s): AMMONIA in the last 168 hours.  CBC: Recent Labs  Lab 10/15/18 0401 10/16/18 0549 10/16/18 1940 10/17/18 0247 10/17/18 1549 10/17/18 2013 10/18/18 0200  WBC 9.4 7.0 9.3 9.4  --   --  8.0  NEUTROABS  --   --  6.6  --   --   --   --   HGB 9.1* 8.8* 8.3* 6.6* 12.4 8.4* 8.5*  HCT 28.3* 27.9* 26.3* 21.2* 36.9  26.1* 25.8*  MCV 90.7 91.8 94.6 94.2  --   --  88.4  PLT 328 312 354 306  --   --  299    Cardiac Enzymes: No results for input(s): CKTOTAL, CKMB, CKMBINDEX, TROPONINI in the last 168 hours.  BNP: Invalid input(s): POCBNP  CBG: Recent Labs  Lab 10/17/18 0754 10/17/18 1211 10/17/18 1632 10/17/18 2114 10/18/18 0731  GLUCAP 126* 144* 103* 177* 126*    Microbiology: Results for orders placed or performed during the hospital encounter of 10/06/18  Culture, blood (Routine X 2) w Reflex to ID Panel     Status: None   Collection Time: 10/07/18  1:10 AM  Result Value Ref Range Status   Specimen Description BLOOD RIGHT ARM  Final   Special Requests   Final    BOTTLES DRAWN AEROBIC AND ANAEROBIC Blood Culture adequate volume   Culture   Final    NO GROWTH 5 DAYS Performed at [facility], [place] [address] [place], [place] [address]    Report Status 10/12/2018 FINAL  Final  Culture, blood (Routine X 2) w Reflex to ID Panel     Status: None    Collection Time: 10/07/18  2:11 AM  Result Value Ref Range Status   Specimen Description BLOOD RIGHT WRIST  Final   Special Requests   Final    BOTTLES DRAWN AEROBIC AND ANAEROBIC Blood Culture adequate volume   Culture   Final    NO GROWTH 5 DAYS Performed at [facility], [place] [address], [place], [place] [address]    Report Status 10/12/2018 FINAL  Final  Wound or Superficial Culture     Status: None   Collection Time: 10/07/18  2:11 AM  Result Value Ref Range Status   Specimen Description WOUND FOOT  Final   Special Requests NONE  Final   Gram Stain   Final    NO WBC SEEN RARE GRAM POSITIVE COCCI IN PAIRS FEW GRAM POSITIVE RODS    Culture   Final    NORMAL SKIN FLORA Performed at [facility], [place] [address], [place], [place] [address]    Report Status 10/09/2018 FINAL  Final  Surgical PCR screen     Status: None   Collection Time: 10/07/18  8:46 PM  Result Value Ref Range Status   MRSA, PCR NEGATIVE NEGATIVE Final   Staphylococcus aureus NEGATIVE NEGATIVE Final    Comment: (NOTE) The Xpert SA Assay (FDA approved for NASAL specimens in patients 37 years of age and older), is one component of a comprehensive surveillance program. It is not intended to diagnose infection nor to guide or monitor treatment. Performed at [facility], [place] [address] [place], [place] [address]     Coagulation Studies: No results for input(s): LABPROT, INR in the last 72 hours.  Urinalysis: No results for input(s): COLORURINE, LABSPEC, PHURINE, GLUCOSEU, HGBUR, BILIRUBINUR, KETONESUR, PROTEINUR, UROBILINOGEN, NITRITE, LEUKOCYTESUR in the last 72 hours.  Invalid input(s): APPERANCEUR    Imaging: Dg Knee 2 Views Left  Result Date: 10/16/2018 CLINICAL DATA:  Amputation.  Fall. EXAM: LEFT KNEE - 1-2 VIEW COMPARISON:  None. FINDINGS: Status post amputation. The skin staples appear displaced consistent with the history of an open wound. No acute fracture. No other acute  abnormalities. IMPRESSION: Status post amputation. No acute bony abnormality. Opened wound per history. Electronically Signed   By: Dorise Bullion III M.D   On: 10/16/2018 20:05     Medications:    .  amLODipine  10 mg Oral Daily  . calcitRIOL  0.25 mcg Oral Daily  . calcium acetate  1,334 mg Oral TID WC  . citalopram  20 mg Oral Daily  . dolutegravir  50 mg Oral Daily   And  . rilpivirine  25 mg Oral Q breakfast  . gabapentin  300 mg Oral Daily  . insulin aspart  0-9 Units Subcutaneous TID WC  . insulin glargine  5 Units Subcutaneous QHS  . labetalol  100 mg Oral BID  . polyethylene glycol  17 g Oral Daily  . senna  2 tablet Oral BID  . sodium bicarbonate  1,300 mg Oral BID  . sodium zirconium cyclosilicate  10 g Oral TID   fentaNYL (SUBLIMAZE) injection, HYDROmorphone (DILAUDID) injection, methocarbamol, ondansetron **OR** ondansetron (ZOFRAN) IV  Assessment/ Plan:   CKD stage V status post AV fistula creation 10/14/2018.  Very advanced renal failure secondary diabetes hypertension HIV.  I would think that we need to have a decision about whether to proceed with dialysis.  She will need a tunneled dialysis catheter.  Anemia iron sats appear to be 65% Will start darbepoetin 60 mcg weekly  Hypertension appears to be under adequate control  Depression appears to be stable\  HIV continue current medications  Metabolic acidosis continue sodium bicarbonate.  Resolved will continue to follow  Hyperkalemia will discontinue Lokelma  Bones continue calcitriol 0.25 mcg daily and calcium acetate 1334 mg 3 times daily   LOS: 2 Sherril Croon @TODAY @9 :17 AM

## 2018-10-18 NOTE — Telephone Encounter (Signed)
Called and spoke with patient gave her dates and times 11/29/18 2:00  U/S  3:15 pm Post op.  Will mail reminder letter and follow up papers

## 2018-10-18 NOTE — Progress Notes (Addendum)
   Subjective: No acute events overnight. She is having some pain and spasms in her left leg this morning. Has not seen any bleeding from her fistula or her BKA site. Denies fevers, chills, n/v. She states that she does not want an HD catheter placed.  Objective:  Vital signs in last 24 hours: Vitals:   10/17/18 1457 10/17/18 2116 10/17/18 2239 10/18/18 0439  BP: 125/67 135/77 131/74 138/60  Pulse: 87 85 87 86  Resp: 17 18  18   Temp: 99.8 F (37.7 C) 98.9 F (37.2 C)  98.5 F (36.9 C)  TempSrc: Oral Oral  Oral  SpO2: 98% 98%  97%  Weight:      Height:       General: awake, alert, lying in bed in NAD CV: RRR; no murmurs Pulm: normal respiratory effort; lungs CTAB Ext: LUE AVF site without bleeding or erythema. Left BKA with dressing in place.  Assessment/Plan:  Principal Problem:   BKA stump complication (HCC) Active Problems:   Diabetes mellitus type 2 in obese [facility])   Essential hypertension   Human immunodeficiency virus (HIV) disease ([place])   CKD (chronic kidney disease), stage V ([place])  Kathy Frank is a42 y.o. femalewith HIV, DM, CKD5, HTN and recent left BKA on 2/29/2020who presented with a mechanical fall the day she was discharged from the hospital. Her left BKA surgical site was found to be open and she was admitted for revision with ortho.  Left BKA Stump Wound Dehiscence  - Continues to have pain. Dressing placed two days ago is still dry, bleeding has likely resolved. Plan - Ortho following, appreciate recs. Revision planned for tomorrow. 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 Likely combination of anemia 2/2 CKD as well as acute blood loss from her surgical site. Patient also has a history of heavy menses that have caused anemia requiring blood transfusions during past hospitalizations. -  Received 2u pRBCs yesterday with increase in Hb from 6.6 to 8.4. Hb stable at 8.5 this am. Plan - Trend CBC - Continue EPO injections and IV iron per nephrology  CKD stage V Hyperkalemia -s/p left brachiobasilic AVFon 67/61/9509  - K has improved from 5.7 to 4.7 today. Cr and BUN still elevated, stable. Plan - Nephrology consulted, appreciate recs - Hold lokelma today -Continue calcitriol, PhosLo, and sodium bicarb per nephro  Type 2 DM - Blood sugars within goal Plan - Lantus 5u qhs - SSI   Hypertension  - BP well-controlled on current regimen Plan - Continue home Amlodipine 10 mg QD and Labetalol 100 mg BID  HIV - Continue Dolutegravir-Rilpivirine  Dispo: Anticipated discharge in approximately 2-3 days.  Kathy Frank, [name], MD 10/18/2018, 6:30 AM Pager: [phone]

## 2018-10-18 NOTE — Plan of Care (Signed)
  Problem: Education: Goal: Knowledge of General Education information will improve Description: Including pain rating scale, medication(s)/side effects and non-pharmacologic comfort measures Outcome: Progressing   Problem: Activity: Goal: Risk for activity intolerance will decrease Outcome: Progressing   Problem: Safety: Goal: Ability to remain free from injury will improve Outcome: Progressing   Problem: Pain Managment: Goal: General experience of comfort will improve Outcome: Progressing   

## 2018-10-19 ENCOUNTER — Inpatient Hospital Stay (HOSPITAL_COMMUNITY): Payer: Medicaid Other | Admitting: Registered Nurse

## 2018-10-19 ENCOUNTER — Encounter (HOSPITAL_COMMUNITY): Payer: Self-pay | Admitting: Registered Nurse

## 2018-10-19 ENCOUNTER — Encounter (HOSPITAL_COMMUNITY)
Admission: EM | Disposition: A | Discharge status: Home / Self Care | Payer: Self-pay | Source: Home / Self Care | Attending: Oncology

## 2018-10-19 DIAGNOSIS — T879 Unspecified complications of amputation stump: Secondary | ICD-10-CM

## 2018-10-19 HISTORY — PX: STUMP REVISION: SHX6102

## 2018-10-19 LAB — RENAL FUNCTION PANEL
Albumin: 2.3 g/dL — ABNORMAL LOW (ref 3.5–5.0)
Anion gap: 8 (ref 5–15)
BUN: 81 mg/dL — ABNORMAL HIGH (ref 6–20)
CO2: 25 mmol/L (ref 22–32)
Calcium: 8.1 mg/dL — ABNORMAL LOW (ref 8.9–10.3)
Chloride: 103 mmol/L (ref 98–111)
Creatinine, Ser: 7.48 mg/dL — ABNORMAL HIGH (ref 0.44–1.00)
GFR calc Af Amer: 7 mL/min — ABNORMAL LOW (ref >60)
GFR calc non Af Amer: 6 mL/min — ABNORMAL LOW (ref >60)
Glucose, Bld: 86 mg/dL (ref 70–99)
Phosphorus: 4.5 mg/dL (ref 2.5–4.6)
Potassium: 4.6 mmol/L (ref 3.5–5.1)
Sodium: 136 mmol/L (ref 135–145)

## 2018-10-19 LAB — CBC
HCT: 24.4 % — ABNORMAL LOW (ref 36.0–46.0)
Hemoglobin: 8 g/dL — ABNORMAL LOW (ref 12.0–15.0)
MCH: 29 pg (ref 26.0–34.0)
MCHC: 32.8 g/dL (ref 30.0–36.0)
MCV: 88.4 fL (ref 80.0–100.0)
Platelets: 267 10*3/uL (ref 150–400)
RBC: 2.76 MIL/uL — ABNORMAL LOW (ref 3.87–5.11)
RDW: 15.8 % — ABNORMAL HIGH (ref 11.5–15.5)
WBC: 7.8 10*3/uL (ref 4.0–10.5)
nRBC: 0 % (ref 0.0–0.2)

## 2018-10-19 LAB — GLUCOSE, CAPILLARY
Glucose-Capillary: 86 mg/dL (ref 70–99)
Glucose-Capillary: 84 mg/dL (ref 70–99)
Glucose-Capillary: 72 mg/dL (ref 70–99)
Glucose-Capillary: 85 mg/dL (ref 70–99)
Glucose-Capillary: 152 mg/dL — ABNORMAL HIGH (ref 70–99)
Glucose-Capillary: 242 mg/dL — ABNORMAL HIGH (ref 70–99)

## 2018-10-19 LAB — HIV-1 RNA QUANT-NO REFLEX-BLD
HIV 1 RNA Quant: <20 copies/mL
LOG10 HIV-1 RNA: UNDETERMINED log10copy/mL

## 2018-10-19 SURGERY — REVISION, AMPUTATION SITE
Anesthesia: General | Site: Leg Lower | Laterality: Left

## 2018-10-19 MED ORDER — CEFAZOLIN SODIUM-DEXTROSE 2-4 GM/100ML-% IV SOLN
2.0000 g | INTRAVENOUS | Status: AC
  Administered 2018-10-19: 2 g via INTRAVENOUS
  Filled 2018-10-19: qty 100

## 2018-10-19 MED ORDER — OXYCODONE HCL 5 MG PO TABS
5.0000 mg | ORAL_TABLET | ORAL | Status: DC | PRN
  Administered 2018-10-19: 10 mg via ORAL
  Administered 2018-10-20: 5 mg via ORAL
  Filled 2018-10-19: qty 2
  Filled 2018-10-19: qty 1
  Filled 2018-10-19: qty 2

## 2018-10-19 MED ORDER — METOCLOPRAMIDE HCL 5 MG/ML IJ SOLN: 5.0000 mg | Freq: Three times a day (TID) | INTRAMUSCULAR | Status: DC | PRN

## 2018-10-19 MED ORDER — METHOCARBAMOL 1000 MG/10ML IJ SOLN
500.0000 mg | Freq: Four times a day (QID) | INTRAVENOUS | Status: DC | PRN
  Filled 2018-10-19: qty 5

## 2018-10-19 MED ORDER — DEXAMETHASONE SODIUM PHOSPHATE 10 MG/ML IJ SOLN
INTRAMUSCULAR | Status: DC | PRN
  Administered 2018-10-19: 4 mg via INTRAVENOUS

## 2018-10-19 MED ORDER — FENTANYL CITRATE (PF) 100 MCG/2ML IJ SOLN
INTRAMUSCULAR | Status: AC
  Administered 2018-10-19: 50 ug via INTRAVENOUS
  Filled 2018-10-19: qty 2

## 2018-10-19 MED ORDER — METOCLOPRAMIDE HCL 5 MG PO TABS: 5.0000 mg | ORAL_TABLET | Freq: Three times a day (TID) | ORAL | Status: DC | PRN

## 2018-10-19 MED ORDER — PHENYLEPHRINE 40 MCG/ML (10ML) SYRINGE FOR IV PUSH (FOR BLOOD PRESSURE SUPPORT)
PREFILLED_SYRINGE | INTRAVENOUS | Status: AC
  Filled 2018-10-19: qty 10

## 2018-10-19 MED ORDER — ONDANSETRON HCL 4 MG PO TABS: 4.0000 mg | ORAL_TABLET | Freq: Four times a day (QID) | ORAL | Status: DC | PRN

## 2018-10-19 MED ORDER — ACETAMINOPHEN 325 MG PO TABS
325.0000 mg | ORAL_TABLET | Freq: Four times a day (QID) | ORAL | Status: DC | PRN
  Filled 2018-10-19: qty 2

## 2018-10-19 MED ORDER — METHOCARBAMOL 500 MG PO TABS
500.0000 mg | ORAL_TABLET | Freq: Four times a day (QID) | ORAL | Status: DC | PRN
  Administered 2018-10-19 – 2018-10-21 (×4): 500 mg via ORAL
  Filled 2018-10-19 (×6): qty 1

## 2018-10-19 MED ORDER — LIDOCAINE 2% (20 MG/ML) 5 ML SYRINGE
INTRAMUSCULAR | Status: DC | PRN
  Administered 2018-10-19: 80 mg via INTRAVENOUS

## 2018-10-19 MED ORDER — PROPOFOL 10 MG/ML IV BOLUS
INTRAVENOUS | Status: AC
  Filled 2018-10-19: qty 20

## 2018-10-19 MED ORDER — ONDANSETRON HCL 4 MG/2ML IJ SOLN: 4.0000 mg | Freq: Four times a day (QID) | INTRAMUSCULAR | Status: DC | PRN

## 2018-10-19 MED ORDER — PROPOFOL 10 MG/ML IV BOLUS
INTRAVENOUS | Status: DC | PRN
  Administered 2018-10-19: 150 mg via INTRAVENOUS

## 2018-10-19 MED ORDER — MAGNESIUM CITRATE PO SOLN: 1.0000 | Freq: Once | ORAL | Status: DC | PRN

## 2018-10-19 MED ORDER — EPHEDRINE 5 MG/ML INJ
INTRAVENOUS | Status: AC
  Filled 2018-10-19: qty 10

## 2018-10-19 MED ORDER — BISACODYL 10 MG RE SUPP: 10.0000 mg | Freq: Every day | RECTAL | Status: DC | PRN

## 2018-10-19 MED ORDER — SODIUM CHLORIDE 0.9 % IV SOLN
INTRAVENOUS | Status: DC
  Administered 2018-10-19 (×3): via INTRAVENOUS

## 2018-10-19 MED ORDER — OXYCODONE HCL 5 MG PO TABS: 5.0000 mg | ORAL_TABLET | Freq: Once | ORAL | Status: DC | PRN

## 2018-10-19 MED ORDER — ONDANSETRON HCL 4 MG/2ML IJ SOLN
INTRAMUSCULAR | Status: DC | PRN
  Administered 2018-10-19: 4 mg via INTRAVENOUS

## 2018-10-19 MED ORDER — 0.9 % SODIUM CHLORIDE (POUR BTL) OPTIME
TOPICAL | Status: DC | PRN
  Administered 2018-10-19 (×2): 1000 mL

## 2018-10-19 MED ORDER — FENTANYL CITRATE (PF) 250 MCG/5ML IJ SOLN
INTRAMUSCULAR | Status: AC
  Filled 2018-10-19: qty 5

## 2018-10-19 MED ORDER — CEFAZOLIN SODIUM-DEXTROSE 1-4 GM/50ML-% IV SOLN
1.0000 g | Freq: Four times a day (QID) | INTRAVENOUS | Status: AC
  Administered 2018-10-19 – 2018-10-20 (×2): 1 g via INTRAVENOUS
  Filled 2018-10-19 (×3): qty 50

## 2018-10-19 MED ORDER — POLYETHYLENE GLYCOL 3350 17 G PO PACK: 17.0000 g | PACK | Freq: Every day | ORAL | Status: DC | PRN

## 2018-10-19 MED ORDER — FENTANYL CITRATE (PF) 100 MCG/2ML IJ SOLN
25.0000 ug | INTRAMUSCULAR | Status: DC | PRN
  Administered 2018-10-19 (×2): 50 ug via INTRAVENOUS

## 2018-10-19 MED ORDER — ONDANSETRON HCL 4 MG/2ML IJ SOLN
4.0000 mg | Freq: Four times a day (QID) | INTRAMUSCULAR | Status: DC | PRN
  Administered 2018-10-20 (×2): 4 mg via INTRAVENOUS
  Filled 2018-10-19 (×2): qty 2

## 2018-10-19 MED ORDER — OXYCODONE HCL 5 MG PO TABS
10.0000 mg | ORAL_TABLET | ORAL | Status: DC | PRN
  Administered 2018-10-19: 10 mg via ORAL
  Administered 2018-10-21 (×2): 15 mg via ORAL
  Filled 2018-10-19 (×3): qty 3

## 2018-10-19 MED ORDER — FENTANYL CITRATE (PF) 250 MCG/5ML IJ SOLN
INTRAMUSCULAR | Status: DC | PRN
  Administered 2018-10-19: 25 ug via INTRAVENOUS
  Administered 2018-10-19 (×2): 50 ug via INTRAVENOUS
  Administered 2018-10-19: 25 ug via INTRAVENOUS

## 2018-10-19 MED ORDER — MIDAZOLAM HCL 2 MG/2ML IJ SOLN
INTRAMUSCULAR | Status: AC
  Filled 2018-10-19: qty 2

## 2018-10-19 MED ORDER — MIDAZOLAM HCL 5 MG/5ML IJ SOLN
INTRAMUSCULAR | Status: DC | PRN
  Administered 2018-10-19: 2 mg via INTRAVENOUS

## 2018-10-19 MED ORDER — SODIUM CHLORIDE 0.9 % IV SOLN: INTRAVENOUS | Status: DC

## 2018-10-19 MED ORDER — OXYCODONE HCL 5 MG/5ML PO SOLN: 5.0000 mg | Freq: Once | ORAL | Status: DC | PRN

## 2018-10-19 MED ORDER — DOCUSATE SODIUM 100 MG PO CAPS
100.0000 mg | ORAL_CAPSULE | Freq: Two times a day (BID) | ORAL | Status: DC
  Filled 2018-10-19 (×3): qty 1

## 2018-10-19 MED ORDER — HYDROMORPHONE HCL 1 MG/ML IJ SOLN
0.5000 mg | INTRAMUSCULAR | Status: DC | PRN
  Administered 2018-10-19 – 2018-10-20 (×5): 1 mg via INTRAVENOUS
  Filled 2018-10-19 (×6): qty 1

## 2018-10-19 MED ORDER — PROPOFOL 1000 MG/100ML IV EMUL
INTRAVENOUS | Status: AC
  Filled 2018-10-19: qty 200

## 2018-10-19 SURGICAL SUPPLY — 45 items
BLADE SAW RECIP 87.9 MT (BLADE) ×4 IMPLANT
BLADE SAW SAG 73X25 THK (BLADE) ×2
BLADE SAW SGTL 73X25 THK (BLADE) IMPLANT
BLADE SURG 21 STRL SS (BLADE) ×4 IMPLANT
BNDG COHESIVE 6X5 TAN STRL LF (GAUZE/BANDAGES/DRESSINGS) ×4 IMPLANT
CANISTER WOUND CARE 500ML ATS (WOUND CARE) ×2 IMPLANT
CANISTER WOUNDNEG PRESSURE 500 (CANNISTER) ×2 IMPLANT
COVER SURGICAL LIGHT HANDLE (MISCELLANEOUS) ×4 IMPLANT
COVER WAND RF STERILE (DRAPES) ×4 IMPLANT
CUFF TOURNIQUET SINGLE 34IN LL (TOURNIQUET CUFF) IMPLANT
DRAPE EXTREMITY T 121X128X90 (DISPOSABLE) ×4 IMPLANT
DRAPE HALF SHEET 40X57 (DRAPES) ×4 IMPLANT
DRAPE INCISE IOBAN 66X45 STRL (DRAPES) ×8 IMPLANT
DRAPE U-SHAPE 47X51 STRL (DRAPES) ×6 IMPLANT
DRESSING PREVENA PLUS CUSTOM (GAUZE/BANDAGES/DRESSINGS) ×2 IMPLANT
DRSG PREVENA PLUS CUSTOM (GAUZE/BANDAGES/DRESSINGS) ×4
DURAPREP 26ML APPLICATOR (WOUND CARE) ×4 IMPLANT
ELECT REM PT RETURN 9FT ADLT (ELECTROSURGICAL) ×4
ELECTRODE REM PT RTRN 9FT ADLT (ELECTROSURGICAL) ×2 IMPLANT
GAUZE SPONGE 4X4 12PLY STRL (GAUZE/BANDAGES/DRESSINGS) ×2 IMPLANT
GLOVE BIOGEL PI IND STRL 9 (GLOVE) ×2 IMPLANT
GLOVE BIOGEL PI INDICATOR 9 (GLOVE) ×2
GLOVE SURG ORTHO 9.0 STRL STRW (GLOVE) ×4 IMPLANT
GOWN STRL REUS W/ TWL XL LVL3 (GOWN DISPOSABLE) ×4 IMPLANT
GOWN STRL REUS W/TWL XL LVL3 (GOWN DISPOSABLE) ×8
KIT BASIN OR (CUSTOM PROCEDURE TRAY) ×4 IMPLANT
KIT TURNOVER KIT B (KITS) ×4 IMPLANT
MANIFOLD NEPTUNE II (INSTRUMENTS) ×4 IMPLANT
NS IRRIG 1000ML POUR BTL (IV SOLUTION) ×4 IMPLANT
PACK GENERAL/GYN (CUSTOM PROCEDURE TRAY) ×4 IMPLANT
PACK ORTHO EXTREMITY (CUSTOM PROCEDURE TRAY) ×4 IMPLANT
PAD ARMBOARD 7.5X6 YLW CONV (MISCELLANEOUS) ×4 IMPLANT
PREVENA RESTOR ARTHOFORM 46X30 (CANNISTER) ×4 IMPLANT
SPONGE LAP 18X18 X RAY DECT (DISPOSABLE) ×2 IMPLANT
SPONGE SCRUB IODOPHOR (GAUZE/BANDAGES/DRESSINGS) ×2 IMPLANT
STAPLER VISISTAT 35W (STAPLE) IMPLANT
STOCKINETTE IMPERVIOUS LG (DRAPES) ×2 IMPLANT
SUT ETHILON 2 0 PSLX (SUTURE) ×8 IMPLANT
SUT SILK 2 0 (SUTURE) ×4
SUT SILK 2-0 18XBRD TIE 12 (SUTURE) ×2 IMPLANT
TOWEL GREEN STERILE FF (TOWEL DISPOSABLE) ×4 IMPLANT
TOWEL OR 17X26 10 PK STRL BLUE (TOWEL DISPOSABLE) ×4 IMPLANT
TUBE CONNECTING 20'X1/4 (TUBING) ×1
TUBE CONNECTING 20X1/4 (TUBING) ×3 IMPLANT
YANKAUER SUCT BULB TIP NO VENT (SUCTIONS) ×4 IMPLANT

## 2018-10-19 NOTE — Progress Notes (Signed)
[facility] ROUNDING NOTE   Subjective:   Is a 43 year old female poorly controlled diabetes hypertension HIV with a CD4 count of 450 she has CKD stage V followed with Dr. Posey Pronto noncompliant.  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  She is poorly controlled diabetes hypertension serum creatinine appears about 6 to 7 mg/dL with mild hyperkalemia.  She is status post first stage basilic vein transposition by Dr. Carlis Abbott 10/14/2018.  She was discharged 10/16/2018 but readmitted later that evening after she fell with a wound dehiscence of her BKA.  It appears that she is planned for surgery 10/19/2018  Blood pressure 145/87 pulse 87 temperature 98.9 O2 sats 96% room air   Sodium 136 potassium 4.6 chloride 103 CO2 25 BUN 81 creatinine 7.48 glucose 86 calcium 8.1 phosphorus 4.5 albumin 2.3 WBC 7.8 hemoglobin 8.0 platelets 267   [place] 50 mg daily and edurant 25 mg daily amlodipine 10 mg daily labetalol 100 mg twice daily Celexa 20 mg a day Calcitrol 0.25 mcg daily, glargine 5 units daily PhosLo 1334 mg 3 times daily, sodium bicarbonate 2 tablets daily,      Objective:  Vital signs in last 24 hours:  Temp:  [98.5 F (36.9 C)-99.1 F (37.3 C)] 98.9 F (37.2 C) (03/11 0505) Pulse Rate:  [81-87] 87 (03/11 0505) Resp:  [17-20] 18 (03/11 0505) BP: (124-145)/(65-87) 145/87 (03/11 0505) SpO2:  [95 %-97 %] 96 % (03/11 0505)  Weight change:  Filed Weights   10/17/18 0127  Weight: 81.7 kg    Intake/Output: I/O last 3 completed shifts: In: 8295 [P.O.:1042] Out: 401 [Urine:400; Stool:1]   Intake/Output this shift:  No intake/output data recorded.  CVS- RRR no murmurs rubs gallops RS- CTA clear with no wheezes or rales ABD- BS present soft non-distended EXT- no edema BKA stump dressing left AV fistula good thrill and bruit   Basic Metabolic Panel: Recent Labs  Lab 10/15/18 0401 10/16/18 0549 10/16/18 1940 10/17/18 0247 10/18/18 0200 10/19/18 0712  NA 132* 135 135 133*  132* 136  K 5.7* 5.1 5.8* 5.7* 4.7 4.6  CL 99 103 101 100 97* 103  CO2 20* 23 23 23 24 25   GLUCOSE 125* 103* 97 113* 148* 86  BUN 65* 78* 89* 96* 92* 81*  CREATININE 6.98* 6.94* 7.19* 7.41* 7.47* 7.48*  CALCIUM 8.2* 8.1* 8.4* 8.1* 8.3* 8.1*  PHOS 4.3 4.6  --  4.4 4.5 4.5    Liver Function Tests: Recent Labs  Lab 10/16/18 0549 10/16/18 1940 10/17/18 0247 10/18/18 0200 10/19/18 0712  AST  --  15  --   --   --   ALT  --  6  --   --   --   ALKPHOS  --  79  --   --   --   BILITOT  --  0.5  --   --   --   PROT  --  7.0  --   --   --   ALBUMIN 2.4* 2.7* 2.4* 2.4* 2.3*   No results for input(s): LIPASE, AMYLASE in the last 168 hours. No results for input(s): AMMONIA in the last 168 hours.  CBC: Recent Labs  Lab 10/16/18 0549 10/16/18 1940 10/17/18 0247 10/17/18 1549 10/17/18 2013 10/18/18 0200 10/19/18 0712  WBC 7.0 9.3 9.4  --   --  8.0 7.8  NEUTROABS  --  6.6  --   --   --   --   --   HGB 8.8* 8.3* 6.6* 12.4  8.4* 8.5* 8.0*  HCT 27.9* 26.3* 21.2* 36.9 26.1* 25.8* 24.4*  MCV 91.8 94.6 94.2  --   --  88.4 88.4  PLT 312 354 306  --   --  299 267    Cardiac Enzymes: No results for input(s): CKTOTAL, CKMB, CKMBINDEX, TROPONINI in the last 168 hours.  BNP: Invalid input(s): POCBNP  CBG: Recent Labs  Lab 10/18/18 1131 10/18/18 1627 10/18/18 2044 10/19/18 0814 10/19/18 0950  GLUCAP 128* 114* 181* 86 45    Microbiology: Results for orders placed or performed during the hospital encounter of 10/06/18  Culture, blood (Routine X 2) w Reflex to ID Panel     Status: None   Collection Time: 10/07/18  1:10 AM  Result Value Ref Range Status   Specimen Description BLOOD RIGHT ARM  Final   Special Requests   Final    BOTTLES DRAWN AEROBIC AND ANAEROBIC Blood Culture adequate volume   Culture   Final    NO GROWTH 5 DAYS Performed at [facility], [place] [address] [place], [place] [address]    Report Status 10/12/2018 FINAL  Final  Culture, blood (Routine X 2) w  Reflex to ID Panel     Status: None   Collection Time: 10/07/18  2:11 AM  Result Value Ref Range Status   Specimen Description BLOOD RIGHT WRIST  Final   Special Requests   Final    BOTTLES DRAWN AEROBIC AND ANAEROBIC Blood Culture adequate volume   Culture   Final    NO GROWTH 5 DAYS Performed at [facility], [place] [address], [place], [place] [address]    Report Status 10/12/2018 FINAL  Final  Wound or Superficial Culture     Status: None   Collection Time: 10/07/18  2:11 AM  Result Value Ref Range Status   Specimen Description WOUND FOOT  Final   Special Requests NONE  Final   Gram Stain   Final    NO WBC SEEN RARE GRAM POSITIVE COCCI IN PAIRS FEW GRAM POSITIVE RODS    Culture   Final    NORMAL SKIN FLORA Performed at [facility], [place] [address] [place], [place] [address]    Report Status 10/09/2018 FINAL  Final  Surgical PCR screen     Status: None   Collection Time: 10/07/18  8:46 PM  Result Value Ref Range Status   MRSA, PCR NEGATIVE NEGATIVE Final   Staphylococcus aureus NEGATIVE NEGATIVE Final    Comment: (NOTE) The Xpert SA Assay (FDA approved for NASAL specimens in patients 26 years of age and older), is one component of a comprehensive surveillance program. It is not intended to diagnose infection nor to guide or monitor treatment. Performed at [facility], [place] [address], [place], [place] [address]     Coagulation Studies: No results for input(s): LABPROT, INR in the last 72 hours.  Urinalysis: No results for input(s): COLORURINE, LABSPEC, PHURINE, GLUCOSEU, HGBUR, BILIRUBINUR, KETONESUR, PROTEINUR, UROBILINOGEN, NITRITE, LEUKOCYTESUR in the last 72 hours.  Invalid input(s): APPERANCEUR    Imaging: No results found.   Medications:   . sodium chloride    .  ceFAZolin (ANCEF) IV     . [MAR Hold] amLODipine  10 mg Oral Daily  . [MAR Hold] calcitRIOL  0.25 mcg Oral Daily  . [MAR Hold] calcium acetate  1,334 mg Oral TID WC  .  [MAR Hold] citalopram  20 mg Oral Daily  . [MAR Hold] darbepoetin (ARANESP) injection -  NON-DIALYSIS  60 mcg Subcutaneous Q Tue-1800  . [MAR Hold] dolutegravir  50 mg Oral Daily   And  . [MAR Hold] rilpivirine  25 mg Oral Q breakfast  . [MAR Hold] gabapentin  300 mg Oral Daily  . [MAR Hold] insulin aspart  0-9 Units Subcutaneous TID WC  . [MAR Hold] insulin glargine  5 Units Subcutaneous QHS  . [MAR Hold] labetalol  100 mg Oral BID  . [MAR Hold] polyethylene glycol  17 g Oral Daily  . [MAR Hold] senna  2 tablet Oral BID  . [MAR Hold] sodium bicarbonate  1,300 mg Oral BID   [MAR Hold]  HYDROmorphone (DILAUDID) injection, [MAR Hold] methocarbamol, [MAR Hold] ondansetron **OR** [MAR Hold] ondansetron (ZOFRAN) IV  Assessment/ Plan:   CKD stage V status post AV fistula creation 10/14/2018.  Very advanced renal failure secondary diabetes hypertension HIV.  She is decided that she does not wish to proceed with dialysis at this particular time and will not need a tunneled dialysis catheter I think it is reasonable for [place] to follow her up as an outpatient.  She follows with Dr. Posey Pronto  Anemia iron sats appear to be 65% she will need follow-up in the anemia clinic.  Hypertension appears to be under adequate control  Depression appears to be stable\  HIV continue current medications  Metabolic acidosis continue sodium bicarbonate.  Resolved will continue to follow  Hyperkalemia will discontinue Lokelma  Bones continue calcitriol 0.25 mcg daily and calcium acetate 1334 mg 3 times daily  Will sign off patient today.  Thank you very much for this most interesting consult I will be happy to see her again as needed in the hospital   LOS: [place] @TODAY @10 :11 AM

## 2018-10-19 NOTE — Op Note (Signed)
10/19/2018  2:42 PM  PATIENT:  Kathy Frank    PRE-OPERATIVE DIAGNOSIS:  Dehiscence Left Below Knee Amputation  POST-OPERATIVE DIAGNOSIS:  Same  PROCEDURE:  REVISION LEFT BELOW KNEE AMPUTATION Application of Praveena wound VAC.  SURGEON:  [name], MD  PHYSICIAN ASSISTANT: April Green. ANESTHESIA:   General  PREOPERATIVE INDICATIONS:  Kathy Frank is a  43 y.o. female with a diagnosis of Dehiscence Left Below Knee Amputation who failed conservative measures and elected for surgical management.    The risks benefits and alternatives were discussed with the patient preoperatively including but not limited to the risks of infection, bleeding, nerve injury, cardiopulmonary complications, the need for revision surgery, among others, and the patient was willing to proceed.  OPERATIVE IMPLANTS: Praveena wound VAC x2  @ENCIMAGES @  OPERATIVE FINDINGS: Dehiscence with large hematoma and ischemic muscles.  OPERATIVE PROCEDURE: Patient was brought the operating room and underwent general anesthetic.  After adequate levels anesthesia were obtained patient's left lower extremity was prepped using Betadine paint and draped into a sterile field a timeout was called.  A [place] incision was made around the ischemic tissue.  The ischemic muscles were excised, the vascular bundles were suture ligated with 2-0 silk.  The distal 2 cm of the tibia and fibula were resected beveled anteriorly.  The wound was irrigated with normal saline all remaining tissue was contractile had good color and consistency.  The incision was closed using 2-0 nylon.  The Praveena customizable and restore vacs were applied.  This had a good suction fit patient was extubated taken the PACU in stable condition.   DISCHARGE PLANNING:  Antibiotic duration: 24 hours  Weightbearing: Nonweightbearing on the left  Pain medication: Opioid pathway ordered  Dressing care/ Wound VAC: Wound VAC x1 week  Ambulatory  devices: Walker  Discharge to: Evaluate for discharge to skilled nursing or inpatient rehabilitation.  Follow-up: In the office 1 week post operative.

## 2018-10-19 NOTE — Progress Notes (Addendum)
0940 Pt to Short stay. Pt wants to sign her surgical consent after talking to the surgeon. Rep[ort was given to Manuela Schwartz in [place] stay. NPO post midnight maint. 1540 Received pt back from PACU, A&O x4. Left BKA with wound vac dressing dry and intact. Pain meds given.

## 2018-10-19 NOTE — Progress Notes (Signed)
Inpatient Rehabilitation Admissions Coordinator  Inpatient Rehab consult received. Pt with dehiscence of L BKA with OR for revision today. I await postoperative PT and OT evals to assist with planning dispo. Noted after initial BK 10/08/2018 that patient progressed well and was discharged home at supervision/min guard assist level. I will follow up tomorrow.  Danne Baxter, RN, MSN Rehab Admissions Coordinator [phone] 10/19/2018 4:40 PM

## 2018-10-19 NOTE — Anesthesia Preprocedure Evaluation (Addendum)
Anesthesia Evaluation  Patient identified by MRN, date of birth, ID band Patient awake    Reviewed: Allergy & Precautions, H&P , NPO status , Patient's Chart, lab work & pertinent test results, reviewed documented beta blocker date and time   Airway Mallampati: II   Neck ROM: full    Dental no notable dental hx.    Pulmonary neg pulmonary ROS, former smoker,    Pulmonary exam normal breath sounds clear to auscultation       Cardiovascular hypertension, Pt. on medications and Pt. on home beta blockers  Rhythm:regular Rate:Normal     Neuro/Psych Depression negative neurological ROS     GI/Hepatic Neg liver ROS, PUD, GERD  ,  Endo/Other  diabetes, Insulin Dependent  Renal/GU Renal InsufficiencyRenal disease  negative genitourinary   Musculoskeletal   Abdominal   Peds  Hematology  (+) Blood dyscrasia, anemia , HIV,   Anesthesia Other Findings   Reproductive/Obstetrics negative OB ROS                            Anesthesia Physical Anesthesia Plan  ASA: III  Anesthesia Plan: General   Post-op Pain Management:    Induction: Intravenous  PONV Risk Score and Plan: 4 or greater and Ondansetron and Midazolam  Airway Management Planned: LMA  Additional Equipment:   Intra-op Plan:   Post-operative Plan: Extubation in OR  Informed Consent: I have reviewed the patients History and Physical, chart, labs and discussed the procedure including the risks, benefits and alternatives for the proposed anesthesia with the patient or authorized representative who has indicated his/her understanding and acceptance.     Dental advisory given  Plan Discussed with: CRNA  Anesthesia Plan Comments:         Anesthesia Quick Evaluation

## 2018-10-19 NOTE — Transfer of Care (Signed)
Immediate Anesthesia Transfer of Care Note  Patient: Kathy Frank  Procedure(s) Performed: REVISION LEFT BELOW KNEE AMPUTATION (Left Leg Lower)  Patient Location: PACU  Anesthesia Type:General  Level of Consciousness: drowsy, patient cooperative and responds to stimulation  Airway & Oxygen Therapy: Patient Spontanous Breathing and Patient connected to face mask oxygen  Post-op Assessment: Report given to RN and Post -op Vital signs reviewed and stable  Post vital signs: Reviewed and stable  Last Vitals:  Vitals Value Taken Time  BP 168/84 10/19/2018  2:34 PM  Temp    Pulse 82 10/19/2018  2:35 PM  Resp 13 10/19/2018  2:35 PM  SpO2 97 % 10/19/2018  2:35 PM  Vitals shown include unvalidated device data.  Last Pain:  Vitals:   10/19/18 0630  TempSrc:   PainSc: 7       Patients Stated Pain Goal: 3 (84/83/50 7573)  Complications: No apparent anesthesia complications

## 2018-10-19 NOTE — Progress Notes (Signed)
   Subjective: No overnight events. Patient feels well this morning except for ongoing pain in her left BKA stump. No acute concerns today. All questions answered.  Objective:  Vital signs in last 24 hours: Vitals:   10/18/18 1200 10/18/18 1600 10/18/18 2047 10/19/18 0505  BP: (!) 142/76 124/67 129/65 (!) 145/87  Pulse: 86 81 84 87  Resp: 18 17 20 18   Temp: 99.1 F (37.3 C) 98.5 F (36.9 C) 98.5 F (36.9 C) 98.9 F (37.2 C)  TempSrc: Oral Oral Oral Oral  SpO2: 95% 97% 97% 96%  Weight:      Height:       Gen: lying comfortably in bed, no distress CV: RRR, no murmurs Pulm: CTAB, normal effort on room air Ext: L BKA site is wrapped. Bandage is clean and dry. No right leg edema.   Assessment/Plan:  Principal Problem:   BKA stump complication (HCC) Active Problems:   Diabetes mellitus type 2 in obese ([place])   Essential hypertension   Human immunodeficiency virus (HIV) disease ([place])   CKD (chronic kidney disease), stage V ([place])  Kathy Frank is a42 y.o. femalewith HIV, DM, CKD5, HTN and recent left BKA on2/29/2020who presented with a mechanical fall the day she was discharged from the hospital.Her left BKA surgical site was found to be openand she was admitted for revision with ortho.  Left BKA StumpWound Dehiscence - Continues to have pain. Dressing placed two days ago is still dry, bleeding has likely resolved. Plan - Ortho following, appreciate recs. Revision today. 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 Likely combination of anemia 2/2 CKD as well as acute blood loss from her surgical site. Patient also has a history of heavy menses that have caused anemia requiring blood transfusions during past hospitalizations. - Hb stable at 8 this am. Plan - Trend CBC - Continue aranesp per nephrology -  Continue to hold DVT ppx in the setting of acute blood loss anemia and surgery scheduled for today  CKD stage V Hyperkalemia -s/p left brachiobasilic AVFon 12/45/8099 - K 4.6 today. Cr and BUN still elevated, but stable. - Patient has reiterated to nephrology that she does not want an HD catheter placed at this time. They will follow her as an outpatient. Nephrology has signed off. Plan -Continuecalcitriol,PhosLo, andsodium bicarb  Type 2 DM -Blood sugars within goal. Plan - Lantus 5u qhs -SSI   Hypertension  - BP 145/87 this am Plan - Continue home Amlodipine 10 mg QD and Labetalol 100 mg BID  HIV - Continue Dolutegravir-Rilpivirine  Dispo: Anticipated discharge in approximately 1-2 days.  Bryar Dahms, [name], MD 10/19/2018, 6:23 AM Pager: [phone]

## 2018-10-19 NOTE — Interval H&P Note (Signed)
History and Physical Interval Note:  10/19/2018 7:05 AM  Kathy Frank  has presented today for surgery, with the diagnosis of Dehiscence Left Below Knee Amputation.  The various methods of treatment have been discussed with the patient and family. After consideration of risks, benefits and other options for treatment, the patient has consented to  Procedure(s): REVISION LEFT BELOW KNEE AMPUTATION (Left) POSSIBLE LEFT ABOVE KNEE AMPUTATION (Left) as a surgical intervention.  The patient's history has been reviewed, patient examined, no change in status, stable for surgery.  I have reviewed the patient's chart and labs.  Questions were answered to the patient's satisfaction.     [name]

## 2018-10-19 NOTE — Anesthesia Procedure Notes (Signed)
Procedure Name: LMA Insertion Date/Time: 10/19/2018 1:53 PM Performed by: Jearld Pies, CRNA Pre-anesthesia Checklist: Patient identified, Emergency Drugs available, Suction available and Patient being monitored Patient Re-evaluated:Patient Re-evaluated prior to induction Oxygen Delivery Method: Circle System Utilized Preoxygenation: Pre-oxygenation with 100% oxygen Induction Type: IV induction Ventilation: Mask ventilation without difficulty LMA: LMA inserted LMA Size: 4.0 Number of attempts: 1 Airway Equipment and Method: Bite block Placement Confirmation: positive ETCO2 Tube secured with: Tape Dental Injury: Teeth and Oropharynx as per pre-operative assessment

## 2018-10-20 ENCOUNTER — Encounter (HOSPITAL_COMMUNITY): Payer: Self-pay | Admitting: Orthopedic Surgery

## 2018-10-20 DIAGNOSIS — Z978 Presence of other specified devices: Secondary | ICD-10-CM

## 2018-10-20 LAB — RENAL FUNCTION PANEL
Albumin: 2.3 g/dL — ABNORMAL LOW (ref 3.5–5.0)
Anion gap: 10 (ref 5–15)
BUN: 77 mg/dL — ABNORMAL HIGH (ref 6–20)
CO2: 21 mmol/L — ABNORMAL LOW (ref 22–32)
Calcium: 7.7 mg/dL — ABNORMAL LOW (ref 8.9–10.3)
Chloride: 103 mmol/L (ref 98–111)
Creatinine, Ser: 7.46 mg/dL — ABNORMAL HIGH (ref 0.44–1.00)
GFR calc Af Amer: 7 mL/min — ABNORMAL LOW (ref >60)
GFR calc non Af Amer: 6 mL/min — ABNORMAL LOW (ref >60)
Glucose, Bld: 216 mg/dL — ABNORMAL HIGH (ref 70–99)
Phosphorus: 4.3 mg/dL (ref 2.5–4.6)
Potassium: 5.4 mmol/L — ABNORMAL HIGH (ref 3.5–5.1)
Sodium: 134 mmol/L — ABNORMAL LOW (ref 135–145)

## 2018-10-20 LAB — CBC
HCT: 23 % — ABNORMAL LOW (ref 36.0–46.0)
Hemoglobin: 7.4 g/dL — ABNORMAL LOW (ref 12.0–15.0)
MCH: 29.4 pg (ref 26.0–34.0)
MCHC: 32.2 g/dL (ref 30.0–36.0)
MCV: 91.3 fL (ref 80.0–100.0)
Platelets: 319 10*3/uL (ref 150–400)
RBC: 2.52 MIL/uL — ABNORMAL LOW (ref 3.87–5.11)
RDW: 15.3 % (ref 11.5–15.5)
WBC: 9.1 10*3/uL (ref 4.0–10.5)
nRBC: 0 % (ref 0.0–0.2)

## 2018-10-20 LAB — GLUCOSE, CAPILLARY
Glucose-Capillary: 117 mg/dL — ABNORMAL HIGH (ref 70–99)
Glucose-Capillary: 152 mg/dL — ABNORMAL HIGH (ref 70–99)
Glucose-Capillary: 83 mg/dL (ref 70–99)
Glucose-Capillary: 143 mg/dL — ABNORMAL HIGH (ref 70–99)

## 2018-10-20 MED ORDER — SODIUM ZIRCONIUM CYCLOSILICATE 10 G PO PACK
10.0000 g | PACK | Freq: Every day | ORAL | Status: DC
  Administered 2018-10-20 – 2018-10-21 (×2): 10 g via ORAL
  Filled 2018-10-20 (×2): qty 1

## 2018-10-20 MED ORDER — LABETALOL HCL 200 MG PO TABS
200.0000 mg | ORAL_TABLET | Freq: Two times a day (BID) | ORAL | Status: DC
  Administered 2018-10-20 – 2018-10-21 (×2): 200 mg via ORAL
  Filled 2018-10-20 (×3): qty 1

## 2018-10-20 NOTE — Progress Notes (Signed)
Physical Therapy Evaluation Patient Details Name: Kathy Frank MRN: [id number] DOB: September 08, 1975 Today's Date: 10/20/2018   History of Present Illness  Kathy Frank is a 43 y.o. female presenting to hospital with dehiscence of her L BKA amputation secondary to fall at home following d/c on 3/8. Patient is s/p L BKA revision on 3/11.  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 Patient required min guard to stand and ambulate short distance to chair with use of RW. Educated and reviewed HEP with patient. Patient is a high fall risk. Given functional mobility deficits, recommending CIR following d/c to regain baseline mobility and independence. Patient is very motivated to regain independence. Patient will benefit from acute psychical therapy to maximize independence and safety with functional mobility.     Follow Up Recommendations CIR    Equipment Recommendations  None recommended by PT    Recommendations for Other Services       Precautions / Restrictions Precautions Precautions: Fall Restrictions Weight Bearing Restrictions: Yes LLE Weight Bearing: Non weight bearing      Mobility  Bed Mobility Overal bed mobility: Needs Assistance Bed Mobility: Supine to Sit     Supine to sit: Supervision     General bed mobility comments: Patient required supervision for bed mobility for safety  Transfers Overall transfer level: Needs assistance Equipment used: Rolling walker (2 wheeled) Transfers: Sit to/from Stand Sit to Stand: Min guard         General transfer comment: Patient required min guard to stand with use of RW. Verbal cues for safe hand placement prior to standing when using  RW.  Ambulation/Gait Ambulation/Gait assistance: Min guard Gait Distance (Feet): 3 Feet Assistive device: Rolling walker (2 wheeled) Gait Pattern/deviations: Step-to pattern Gait velocity: decreased Gait velocity interpretation: <1.8 ft/sec, indicate of risk for recurrent falls General Gait Details: Patient ambulated short distance to chair with min guard and use of RW for safety. Patient ambulated with hop to pattern. Verbal cues for sequencing with RW.   Stairs            Wheelchair Mobility    Modified Rankin (Stroke Patients Only)       Balance Overall balance assessment: Needs assistance Sitting-balance support: No upper extremity supported;Feet supported Sitting balance-Leahy Scale: Good     Standing balance support: Bilateral upper extremity supported Standing balance-Leahy Scale: Poor Standing balance comment: reliant on BUE support to maintain standing balance                             Pertinent Vitals/Pain Pain Assessment: 0-10 Pain Score: 7  Pain Location: L residual limb Pain Descriptors / Indicators: Aching;Operative site guarding Pain Intervention(s): Limited activity within patient's tolerance;Monitored during session    Home Living Family/patient expects to be discharged to:: Private residence Living Arrangements: Parent Available Help at Discharge: Family Type of Home: House Home Access: Ramped entrance     Home Layout: One level Home Equipment: Environmental consultant - 2 wheels;Bedside commode;Wheelchair - manual      Prior Function Level of Independence: Independent with assistive device(s)         Comments: Using a RW and reports no assistance with ADLs when leaving hospital  Hand Dominance        Extremity/Trunk Assessment   Upper Extremity Assessment Upper Extremity Assessment: Defer to OT evaluation    Lower Extremity Assessment Lower Extremity Assessment: LLE deficits/detail LLE Deficits / Details: s/p L BKA     Cervical / Trunk Assessment Cervical / Trunk Assessment: Normal  Communication   Communication: No difficulties  Cognition Arousal/Alertness: Awake/alert Behavior During Therapy: WFL for tasks assessed/performed Overall Cognitive Status: Within Functional Limits for tasks assessed                                        General Comments General comments (skin integrity, edema, etc.): Educated patient about benefit for rehab following d/c.    Exercises Amputee Exercises Quad Sets: AROM;Left;10 reps;Seated Gluteal Sets: AROM;Both;10 reps;Seated Hip ABduction/ADduction: AROM;Left;10 reps;Seated Straight Leg Raises: AROM;Left;10 reps;Seated   Assessment/Plan    PT Assessment Patient needs continued PT services  PT Problem List Decreased strength;Decreased range of motion;Decreased activity tolerance;Decreased balance;Decreased mobility;Decreased knowledge of use of DME;Pain;Decreased knowledge of precautions       PT Treatment Interventions DME instruction;Gait training;Functional mobility training;Therapeutic activities;Therapeutic exercise;Balance training;Patient/family education    PT Goals (Current goals can be found in the Care Plan section)  Acute Rehab PT Goals Patient Stated Goal: go home PT Goal Formulation: With patient Time For Goal Achievement: 11/03/18 Potential to Achieve Goals: Good    Frequency Min 3X/week   Barriers to discharge        Co-evaluation               AM-PAC PT "6 Clicks" Mobility  Outcome Measure Help needed turning from your back to your side while in a flat bed without using bedrails?: None Help needed moving from lying on your back to sitting on the side of a flat bed without using bedrails?: None Help needed moving to and from a bed to a chair (including a wheelchair)?: A Little Help needed standing up from a chair using your arms (e.g., wheelchair or bedside chair)?: A Little Help needed to walk in hospital  room?: A Little Help needed climbing 3-5 steps with a railing? : A Lot 6 Click Score: 19    End of Session Equipment Utilized During Treatment: Gait belt Activity Tolerance: Patient tolerated treatment well Patient left: in chair;with call bell/phone within reach;with chair alarm set Nurse Communication: Mobility status PT Visit Diagnosis: Muscle weakness (generalized) (M62.81);Other abnormalities of gait and mobility (R26.89);Difficulty in walking, not elsewhere classified (R26.2);History of falling (Z91.81) Pain - Right/Left: Left Pain - part of body: Leg    Time: 1204-1232 PT Time Calculation (min) (ACUTE ONLY): 28 min   Charges:   PT Evaluation $PT Eval Moderate Complexity: 1 Mod PT Treatments $Therapeutic Activity: 8-22 mins        Erick Blinks, SPT  Erick Blinks 10/20/2018, 2:15 PM

## 2018-10-20 NOTE — Progress Notes (Signed)
Patient ID: Kathy Frank, female   DOB: [birth year], 43 y.o.   MRN: [id number] Patient is status post left transtibial amputation.  There is no drainage in the wound VAC canister there is 1 check patient states she would like to go home.  We will see how she progresses with therapy.

## 2018-10-20 NOTE — Progress Notes (Signed)
   Subjective: No overnight events. Kathy Frank reports that her surgery went well yesterday. They were able to spare her knee, which she is thankful for. She is having pain at the surgical site today. She otherwise feels well. She states that she'll consider inpatient rehab, but she does not want to go to a SNF.   Objective:  Vital signs in last 24 hours: Vitals:   10/19/18 1540 10/19/18 2056 10/20/18 0031 10/20/18 0434  BP: (!) 159/75 (!) 158/75 (!) 153/110 (!) 148/70  Pulse: 83 93 89 91  Resp: 20 15 14 15   Temp: 98.3 F (36.8 C) 99 F (37.2 C) 98.5 F (36.9 C) 98.4 F (36.9 C)  TempSrc: Oral Oral Oral Oral  SpO2: (!) 88% 96% 94% 94%  Weight:      Height:       Gen: lying comfortably in bed, no distress CV: RRR, no murmurs Pulm: CTAB, normal effort on room air Ext: L BKA stump with wound vac in place, no drainage in wound vac cannister   Assessment/Plan:  Principal Problem:   BKA stump complication (HCC) Active Problems:   Diabetes mellitus type 2 in obese (HCC)   Essential hypertension   Human immunodeficiency virus (HIV) disease (HCC)   CKD (chronic kidney disease), stage V ([place])  Kathy Frank is a25 y.o. femalewith HIV, DM, CKD5, HTN and recent left BKA on2/29/2020who presented with a mechanical fall the day she was discharged from the hospital.Her left BKA surgical site was found to be openand she was admitted for revision with ortho.  Left BKA StumpWound Dehiscence -Underwent revision with ortho 3/11 - CIR consulted for placement Plan - Ortho following, appreciate recs - Pain control: opioid pathway per ortho with PO oxycodone and dilaudid IV for breakthrough pain. Will wean as tolerated. - Methocarbamol500 mg q6hrs prnfor muscle spasms - Continue gabapentin 300mg  daily - Continue wound vac in one week. F/u with ortho 1 week post-op.  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 Likely combination of  anemia 2/2 CKD as well as acute blood loss from her surgical site. Patient also has a history of heavy menses that have caused anemia requiring blood transfusions during past hospitalizations. -Hb 7.4 this am Plan - Trend CBC - Continue aranesp per nephrology - Continue to hold DVT ppx in the setting of acute blood loss anemia and surgery scheduled for today  CKD stage V Hyperkalemia -s/p left brachiobasilic AVFon 40/03/6760 - K 5.4 today. Cr and BUN still elevated, but stable. - Patient has reiterated to nephrology that she does not want an HD catheter placed at this time. They will follow her as an outpatient. Nephrology has signed off. Plan -Continuecalcitriol,PhosLo, andsodium bicarb - Resume lokelma 10g daily  Type 2 DM -Blood sugars elevated post-op. Received 4mg  decadron with surgery. Plan - Lantus 5u qhs -SSI   Hypertension  - BP 145/70 this am Plan - Continue home Amlodipine 10 mg QD  - Increase labetalol to 200 mg BID  HIV - Continue Dolutegravir-Rilpivirine  Dispo: Anticipated discharge pending CIR approval.  Kathy Frank, [name], MD 10/20/2018, 6:45 AM Pager: [phone]

## 2018-10-20 NOTE — Progress Notes (Signed)
Inpatient Rehabilitation Admissions Coordinator  Inpatient Rehab Consult received. I met with patient  at the bedside for rehabilitation assessment. We discussed goals and expectations of an inpatient rehab admission.  Pt is doing well with therapy and prefers d/c home with HH follow up. She plans to stay at her Mom's house with ramp and it is wheelchair accessible. Her fall with injury occurred at her home next door to her mother's as she was gathering her belongings to go to her Mom's. I have notified SW, [name], and will sign off at this time.   , RN, MSN Rehab Admissions Coordinator [phone] 10/20/2018 3:14 PM     

## 2018-10-20 NOTE — Anesthesia Postprocedure Evaluation (Signed)
Anesthesia Post Note  Patient: Kathy Frank  Procedure(s) Performed: REVISION LEFT BELOW KNEE AMPUTATION (Left Leg Lower)     Patient location during evaluation: PACU Anesthesia Type: General Level of consciousness: awake and alert Pain management: pain level controlled Vital Signs Assessment: post-procedure vital signs reviewed and stable Respiratory status: spontaneous breathing, nonlabored ventilation, respiratory function stable and patient connected to nasal cannula oxygen Cardiovascular status: blood pressure returned to baseline and stable Postop Assessment: no apparent nausea or vomiting Anesthetic complications: no    Last Vitals:  Vitals:   10/20/18 0746 10/20/18 0908  BP: (!) 166/91 (!) 160/81  Pulse: 90 89  Resp: 20 18  Temp: 37.3 C 37.3 C  SpO2: 100% 96%    Last Pain:  Vitals:   10/20/18 0908  TempSrc: Oral  PainSc: [place]

## 2018-10-20 NOTE — Progress Notes (Signed)
Occupational Therapy Evaluation Patient Details Name: Kathy Frank MRN: [id number] DOB: Jun 15, 1976 Today's Date: 10/20/2018    History of Present Illness Kathy Frank is a 43 y.o. female presenting to hospital with dehiscence of her L BKA amputation secondary to fall at home following d/c on 3/8. Patient is s/p L BKA revision on 3/11.  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 Pt states her RW got caught on a rug laying over a rug  which caused her fall. Pt completed ADL and mobility session @ S level using RW. Pt states she does not want to go to rehab and plans to DC to her Mom's house which has a ramped entrance. Recommend pt primarily use of w/c for mobility unless her daughters are with her to walk beside her using the gait belt @ RW level.. Pt verbalized understanding. Will follow acutely to facilitate safe DC home.      Follow Up Recommendations  Supervision - Intermittent;Home health OT    Equipment Recommendations  Tub/shower bench    Recommendations for Other Services       Precautions / Restrictions Precautions Precautions: Fall Restrictions Weight Bearing Restrictions: Yes LLE Weight Bearing: Non weight bearing      Mobility Bed Mobility Overal bed mobility: Modified Independent     Transfers Overall transfer level: Modified independent Equipment used: Rolling walker (2 wheeled)   Balance Overall balance assessment: Needs assistance;History of Falls Sitting-balance support: No upper extremity supported;Feet supported Sitting balance-Leahy Scale: Good     Standing balance support: Bilateral upper extremity supported Standing balance-Leahy Scale: Fair Standing balance comment: able to release RW with 1 hand briefly                            ADL either performed or assessed with clinical judgement   ADL Overall ADL's : Needs assistance/impaired     Grooming: Set up;Sitting   Upper Body Bathing: Set up;Sitting   Lower Body Bathing: Set up;Sit to/from stand   Upper Body Dressing : Set up;Sitting   Lower Body Dressing: Set up;Sit to/from stand   Toilet Transfer: Supervision/safety;RW;Ambulation;BSC   Toileting- Water quality scientist and Hygiene: Set up       Functional mobility during ADLs: Supervision/safety;Rolling walker  Educated pt on recommendation to complete ADL from seated position and to limit standing in unsupported positions unless she was with her daughters. Pt verbalized understanding. Pt plans to use BSC.  May benefit from use of reacher.      Vision   Vision Assessment?: Vision impaired- to be further tested in functional context Additional Comments: diabetic retinopathy     Perception     Praxis      Pertinent Vitals/Pain Pain Assessment: 0-10 Pain Score: 7  Pain Location: L residual limb Pain Descriptors / Indicators: Aching;Operative site guarding Pain Intervention(s): Limited activity within patient's tolerance;Repositioned     Hand Dominance Right   Extremity/Trunk Assessment Upper Extremity Assessment Upper Extremity Assessment: Overall WFL for tasks assessed   Lower Extremity Assessment Lower Extremity Assessment: LLE deficits/detail LLE Deficits / Details: s/p L BKA   Cervical / Trunk Assessment Cervical / Trunk Assessment: Normal   Communication Communication Communication: No difficulties   Cognition Arousal/Alertness: Awake/alert  Behavior During Therapy: WFL for tasks assessed/performed Overall Cognitive Status: Within Functional Limits for tasks assessed                                     General Comments  educated pt on importance of terminal extension of L knee. Pt verbalized understanding.     Exercises Exercises:  Amputee Amputee Exercises Quad Sets: AROM;Left;10 reps;Seated Gluteal Sets: AROM;Both;10 reps;Seated Hip ABduction/ADduction: AROM;Left;10 reps;Seated Straight Leg Raises: AROM;Left;10 reps;Seated   Shoulder Instructions      Home Living Family/patient expects to be discharged to:: Private residence Living Arrangements: Parent Available Help at Discharge: Family Type of Home: House Home Access: Ramped entrance(mom's house)     Home Layout: One level     Bathroom Shower/Tub: Teacher, early years/pre: Standard Bathroom Accessibility: Yes How Accessible: Accessible via walker Home Equipment: [place] - 2 wheels;Bedside commode;Wheelchair - manual(states Mom has a tub bench)          Prior Functioning/Environment Level of Independence: Independent with assistive device(s)        Comments: Using a RW and reports no assistance with ADLs when leaving hospital         OT Problem List: Decreased strength;Impaired balance (sitting and/or standing);Decreased knowledge of use of DME or AE;Pain      OT Treatment/Interventions: Self-care/ADL training;DME and/or AE instruction;Therapeutic activities;Patient/family education    OT Goals(Current goals can be found in the care plan section) Acute Rehab OT Goals Patient Stated Goal: go home OT Goal Formulation: With patient Time For Goal Achievement: 11/03/18 Potential to Achieve Goals: Good  OT Frequency: Min 3X/week   Barriers to D/C:            Co-evaluation              AM-PAC OT "6 Clicks" Daily Activity     Outcome Measure Help from another person eating meals?: None Help from another person taking care of personal grooming?: None Help from another person toileting, which includes using toliet, bedpan, or urinal?: A Little Help from another person bathing (including washing, rinsing, drying)?: None Help from another person to put on and taking off regular upper body clothing?: None Help from another  person to put on and taking off regular lower body clothing?: A Little 6 Click Score: 22   End of Session Equipment Utilized During Treatment: Gait belt;Rolling walker Nurse Communication: Mobility status  Activity Tolerance: Patient tolerated treatment well Patient left: in bed;with call bell/phone within reach(all rails up per pt request)  OT Visit Diagnosis: Unsteadiness on feet (R26.81);History of falling (Z91.81);Muscle weakness (generalized) (M62.81);Pain Pain - Right/Left: Left Pain - part of body: Leg                Time: 1430-1453 OT Time Calculation (min): 23 min Charges:  OT General Charges $OT Visit: 1 Visit OT Evaluation $OT Eval Moderate Complexity: 1 Mod OT Treatments $Self Care/Home Management : 8-22 mins  Maurie Boettcher, OT/L   Acute OT Clinical Specialist [place] Pager [phone] Office [phone]   [facility] 10/20/2018, 3:11 PM

## 2018-10-20 NOTE — Progress Notes (Signed)
CSW acknowledges SNF consult.   Pending CIR approval, please notify CSW if approved or denied. Thank you  [place], [place] [phone]

## 2018-10-20 NOTE — Discharge Summary (Signed)
Name: Kathy Frank MRN: [id number] DOB: 17-Nov-1975 43 y.o. PCP: Lars Mage, MD  Date of Admission: 10/16/2018  7:02 PM Date of Discharge: 10/21/2018 Attending Physician: Dr. Beryle Beams  Discharge Diagnosis: 1. Left BKA wound dehiscence 2. Acute on chronic anemia 3. Abnormal uterine bleeding 4. CKDV 5. Hyperkalemia 6. HTN  Discharge Medications: Allergies as of 10/21/2018   No Known Allergies     Medication List    STOP taking these medications   sodium zirconium cyclosilicate 10 g Pack packet Commonly known as:  Lokelma   sulfamethoxazole-trimethoprim 800-160 MG tablet Commonly known as:  BACTRIM DS,SEPTRA DS     TAKE these medications   amLODipine 10 MG tablet Commonly known as:  NORVASC Take 1 tablet (10 mg total) by mouth daily.   calcitRIOL 0.25 MCG capsule Commonly known as:  ROCALTROL Take 1 capsule (0.25 mcg total) by mouth daily.   calcium acetate 667 MG capsule Commonly known as:  PHOSLO Take 2 capsules (1,334 mg total) by mouth 3 (three) times daily with meals.   citalopram 20 MG tablet Commonly known as:  CeleXA Take 1 tablet (20 mg total) by mouth daily.   Darbepoetin Alfa 60 MCG/0.3ML Sosy injection Commonly known as:  ARANESP Inject 0.3 mLs (60 mcg total) into the skin every Tuesday at 6 PM.   Dolutegravir-Rilpivirine 50-25 MG Tabs Commonly known as:  Juluca Take 1 tablet by mouth daily. Please take with largest meal of the day.   gabapentin 300 MG capsule Commonly known as:  NEURONTIN Take 1 capsule (300 mg total) by mouth daily.   glucose blood test strip Commonly known as:  Accu-Chek Guide USE TO TEST BLOOD SUGAR 3 TIMES DAILY . Diagnosis code  E11.69, E66.9   Insulin Glargine 100 UNIT/ML Solostar Pen Commonly known as:  LANTUS Inject 5 Units into the skin at bedtime.   labetalol 200 MG tablet Commonly known as:  NORMODYNE Take 1 tablet (200 mg total) by mouth 2 (two) times daily. What changed:    medication strength   how much to take   liraglutide 18 MG/3ML Sopn Commonly known as:  Victoza Inject 0.2 mLs (1.2 mg total) into the skin daily.   megestrol 40 MG tablet Commonly known as:  MEGACE Take 1 tablet (40 mg total) by mouth daily.   methocarbamol 500 MG tablet Commonly known as:  ROBAXIN Take 1 tablet (500 mg total) by mouth every 6 (six) hours as needed for muscle spasms.   nutrition supplement (JUVEN) Pack Take 1 packet by mouth 2 (two) times daily between meals.   ondansetron 4 MG tablet Commonly known as:  Zofran Take 1 tablet (4 mg total) by mouth daily as needed for nausea or vomiting.   oxyCODONE 5 MG immediate release tablet Commonly known as:  Oxy IR/ROXICODONE Take 1 tablet (5 mg total) by mouth every 6 (six) hours as needed for up to 5 days for moderate pain or severe pain.   polyethylene glycol packet Commonly known as:  MIRALAX / GLYCOLAX Take 17 g by mouth daily. What changed:    when to take this  reasons to take this   senna 8.6 MG Tabs tablet Commonly known as:  SENOKOT Take 2 tablets (17.2 mg total) by mouth 2 (two) times daily. What changed:    when to take this  reasons to take this   sodium bicarbonate 650 MG tablet Take 2 tablets (1,300 mg total) by mouth 2 (two) times daily.   sodium polystyrene 15 GM/60ML  suspension Commonly known as:  KAYEXALATE Take 60 mLs (15 g total) by mouth daily.       Disposition and follow-up:   Kathy Frank was discharged from [facility] in Stable condition.  At the hospital follow up visit please address:  1. Left BKA wound dehiscence - Please ensure patient has appropriate f/u with ortho (around 3/18). Patient should continue wound vac until then.  2. Acute on chronic anemia - Multifactorial. Blood loss post-op, abnormal uterine bleeding, and CKD. - Please repeat Hb - If low, please admit for transfusion   3. Abnormal uterine bleeding - Started on Megace per ob-gyn - Unable to secure  patient an ob-gyn follow-up appointment. Please make referral at f/u.  4. CKDV  - Cr elevated, but stable at discharge. No signs of uremia or volume overload. - Please repeat BMP and ensure appropriate f/u with nephro  5. Hyperkalemia - Discharged on kayexalate - Please repeat K  2.  Labs / imaging needed at time of follow-up: CBC, BMP  3.  Pending labs/ test needing follow-up: none  Follow-up Appointments: Follow-up Information    [name], MD In 1 week.   Specialty:  Orthopedic Surgery Contact information: [place] [address] [place] Hospital Course by problem list: 1. Left BKA wound dehiscence: Kathy Frank is a45 y.o. femalewith HIV, DMII, CKD5, HTN and recent left BKA on2/29/2020who presented with a mechanical fall the day she was discharged from the hospital.Her left BKA surgical site was found to be open and she underwent revision with ortho on 01/12/5408 without complication. She was discharged home with outpatient PT referral. She declined CIR, SNF, and home PT. She was discharged with the wound vac in place and plan for ortho schedule one week post-op.  2. Acute on chronic anemia and AUB: Anemia is multifactorial. Likely combination of anemia 2/2 CKD as well as acute blood loss from her surgical site. Patient also has a history of heavy menses with prolonged bleeding during her hospitalization. She required blood transfusions this admission. Hb stabilized. Continued aranesp per nephrology. Started on Megace per ob-gyn.  3. CKDV: Patient declined catheter placement and HD initiation despite elevated Cr. Will f/u with nephro in the outpatient setting.  4. Hyperkalemia: Normokalemic at discharge. Unable to secure patient lokelma, so discharged with kayexalate.   5. HTN: increased home metoprolol from 100mg  bid to 200mg  bid  Discharge Vitals:   BP 134/75 (BP Location: Right Arm)   Pulse 86   Temp 98.6 F (37 C)  (Oral)   Resp 16   Ht 5\' 5"  (1.651 m)   Wt 81.7 kg   SpO2 100%   BMI 29.97 kg/m   Pertinent Labs, Studies, and Procedures:  CBC Latest Ref Rng & Units 10/21/2018 10/20/2018 10/19/2018  WBC 4.0 - 10.5 K/uL 8.6 9.1 7.8  Hemoglobin 12.0 - 15.0 g/dL 6.9(LL) 7.4(L) 8.0(L)  Hematocrit 36.0 - 46.0 % 21.9(L) 23.0(L) 24.4(L)  Platelets 150 - 400 K/uL 311 319 267   CMP Latest Ref Rng & Units 10/21/2018 10/20/2018 10/19/2018  Glucose 70 - 99 mg/dL 100(H) 216(H) 86  BUN 6 - 20 mg/dL 68(H) 77(H) 81(H)  Creatinine 0.44 - 1.00 mg/dL 7.34(H) 7.46(H) 7.48(H)  Sodium 135 - 145 mmol/L 135 134(L) 136  Potassium 3.5 - 5.1 mmol/L 4.9 5.4(H) 4.6  Chloride 98 - 111 mmol/L 105 103 103  CO2 22 - 32 mmol/L 22 21(L) 25  Calcium 8.9 - 10.3 mg/dL 8.0(L) 7.7(L) 8.1(L)  Total Protein 6.5 - 8.1 g/dL - - -  Total Bilirubin 0.3 - 1.2 mg/dL - - -  Alkaline Phos 38 - 126 U/L - - -  AST 15 - 41 U/L - - -  ALT 0 - 44 U/L - - -    Discharge Instructions: Discharge Instructions    Ambulatory referral to Physical Therapy   Complete by:  As directed    S/p revision of right BKA   Diet - low sodium heart healthy   Complete by:  As directed    Discharge instructions   Complete by:  As directed    It was a pleasure taking care of you in the hospital, Ms. Wanner!  - You were hospitalized for revision of your left leg surgical site. You will be discharged home today with home therapy.  - You will continue to wear the wound vac for 1 week and need to f/u with Dr. Jess Barters office in one week.  - I have written a prescription for oxycodone and robaxin for pain relief at home. You can also take tylenol. Avoid ibuprofen. - You also need to follow-up with the kidney doctors as soon as possible after discharge. In the meantime, take kayexalate 15gm daily to keep your potassium levels down. - You have required blood transfusions during your hospitalization for low blood levels. Your prolonged period is likely contributing to this  problem. Start taking Megace 40mg  daily for this bleeding. You will also need to follow-up with OB-GYN for further evaluation of this problem.   Please follow-up in the internal medicine clinic on Tuesday for repeat blood work.  Feel free to call our clinic at [phone] if you have any questions.  Thanks, Dr. Annie Paras   Increase activity slowly   Complete by:  As directed       Signed: Millisa Giarrusso, [name], MD 10/22/2018, 12:51 PM   Pager: [phone]

## 2018-10-21 ENCOUNTER — Telehealth: Payer: Self-pay | Admitting: Internal Medicine

## 2018-10-21 DIAGNOSIS — N921 Excessive and frequent menstruation with irregular cycle: Secondary | ICD-10-CM

## 2018-10-21 LAB — RENAL FUNCTION PANEL
Albumin: 2.5 g/dL — ABNORMAL LOW (ref 3.5–5.0)
Anion gap: 8 (ref 5–15)
BUN: 68 mg/dL — ABNORMAL HIGH (ref 6–20)
CO2: 22 mmol/L (ref 22–32)
Calcium: 8 mg/dL — ABNORMAL LOW (ref 8.9–10.3)
Chloride: 105 mmol/L (ref 98–111)
Creatinine, Ser: 7.34 mg/dL — ABNORMAL HIGH (ref 0.44–1.00)
GFR calc Af Amer: 7 mL/min — ABNORMAL LOW (ref >60)
GFR calc non Af Amer: 6 mL/min — ABNORMAL LOW (ref >60)
Glucose, Bld: 100 mg/dL — ABNORMAL HIGH (ref 70–99)
Phosphorus: 3.7 mg/dL (ref 2.5–4.6)
Potassium: 4.9 mmol/L (ref 3.5–5.1)
Sodium: 135 mmol/L (ref 135–145)

## 2018-10-21 LAB — GLUCOSE, CAPILLARY
Glucose-Capillary: 76 mg/dL (ref 70–99)
Glucose-Capillary: 86 mg/dL (ref 70–99)
Glucose-Capillary: 145 mg/dL — ABNORMAL HIGH (ref 70–99)

## 2018-10-21 LAB — CBC
HCT: 21.9 % — ABNORMAL LOW (ref 36.0–46.0)
Hemoglobin: 6.9 g/dL — CL (ref 12.0–15.0)
MCH: 28.8 pg (ref 26.0–34.0)
MCHC: 31.5 g/dL (ref 30.0–36.0)
MCV: 91.3 fL (ref 80.0–100.0)
Platelets: 311 10*3/uL (ref 150–400)
RBC: 2.4 MIL/uL — ABNORMAL LOW (ref 3.87–5.11)
RDW: 15.4 % (ref 11.5–15.5)
WBC: 8.6 10*3/uL (ref 4.0–10.5)
nRBC: 0 % (ref 0.0–0.2)

## 2018-10-21 LAB — PREPARE RBC (CROSSMATCH)

## 2018-10-21 MED ORDER — OXYCODONE HCL 5 MG PO TABS: 5.0000 mg | ORAL_TABLET | Freq: Four times a day (QID) | ORAL | 0 refills | Status: AC | PRN

## 2018-10-21 MED ORDER — MEGESTROL ACETATE 40 MG PO TABS
40.0000 mg | ORAL_TABLET | Freq: Every day | ORAL | Status: DC
  Filled 2018-10-21: qty 1

## 2018-10-21 MED ORDER — SODIUM CHLORIDE 0.9% IV SOLUTION
Freq: Once | INTRAVENOUS | Status: AC
  Administered 2018-10-21: 11:00:00 via INTRAVENOUS

## 2018-10-21 MED ORDER — LABETALOL HCL 200 MG PO TABS: 200.0000 mg | ORAL_TABLET | Freq: Two times a day (BID) | ORAL | 0 refills | Status: DC

## 2018-10-21 MED ORDER — MEGESTROL ACETATE 40 MG PO TABS: 40.0000 mg | ORAL_TABLET | Freq: Every day | ORAL | 1 refills | Status: DC

## 2018-10-21 MED ORDER — SODIUM POLYSTYRENE SULFONATE 15 GM/60ML PO SUSP: 15.0000 g | Freq: Every day | ORAL | 0 refills | Status: DC

## 2018-10-21 MED ORDER — METHOCARBAMOL 500 MG PO TABS: 500.0000 mg | ORAL_TABLET | Freq: Four times a day (QID) | ORAL | 0 refills | Status: DC | PRN

## 2018-10-21 MED FILL — SPS 15 GM/60 ML SUSPENSION: 15 | 4 days supply | Qty: 240 | Fill #0

## 2018-10-21 NOTE — Progress Notes (Signed)
Critical lab : HGB 6.9- Dr. Denyse Amass on call for Kathy Frank will defer orders to Dr. Sharol Frank when he makes rounds. New iv started per iv team

## 2018-10-21 NOTE — Progress Notes (Signed)
Physical Therapy Treatment Patient Details Name: Kathy Frank MRN: [id number] DOB: 12/24/1975 Today's Date: 10/21/2018    History of Present Illness Kathy Frank is a 43 y.o. female presenting to hospital with dehiscence of her L BKA amputation secondary to fall at home following d/c on 3/8. Patient is s/p L BKA revision on 3/11.  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 Pt is mod I with all bed mobility and transfers. Pt educated on and performed exercises and positioning to insure proper healing and fit for residual prosthesis. Pt limited d/t fatigue when performing gait, but is min guard throughout. Pt performed stair and required mod A to correct slight LOB d/t improper descent technique. Pt refusing inpatient rehab at this time and upon discussing options with pt she prefers outpatient rehab. Spoke with supervising PT and notified of need for d/c plans. Plan to progress pt gait and further stair practice to prepare for d/c.    Follow Up Recommendations  CIR     Equipment Recommendations  None recommended by PT    Recommendations for Other Services OT consult     Precautions / Restrictions Precautions Precautions: Fall Precaution Comments: Fall risk greatly reduced with use of RW; residual limb protector Restrictions Weight Bearing Restrictions: Yes LLE Weight Bearing: Non weight bearing    Mobility  Bed Mobility Overal bed mobility: Modified Independent Bed Mobility: Supine to Sit     Supine to sit: Supervision     General bed mobility comments: Patient required supervision for bed mobility for safety  Transfers Overall transfer level: Modified independent Equipment used: Rolling walker (2 wheeled) Transfers: Sit to/from  Stand Sit to Stand: Min guard         General transfer comment: Patient required min guard to stand with use of RW. Verbal cues for safe hand placement prior to standing when using RW.  Ambulation/Gait Ambulation/Gait assistance: Min guard Gait Distance (Feet): 12 Feet Assistive device: Rolling walker (2 wheeled) Gait Pattern/deviations: Step-to pattern(hop-to) Gait velocity: decreased   General Gait Details: Pt ambulated short distance to room door and then back to recliner chair with min guard as she stated she did not want to walk outside the room today.   Stairs Stairs: Yes Stairs assistance: Mod assist Stair Management: Forwards;One rail Right Number of Stairs: 6 General stair comments: Pt with slight LOB descending stairs on second trial. Pt educated on putting crutch down step first to prevent LOB and potential fall. Upon third trial pt was able to complete descent properly.    Wheelchair Mobility    Modified Rankin (Stroke Patients Only)       Balance Overall balance assessment: Needs assistance;History of Falls Sitting-balance support: No upper extremity supported;Feet supported Sitting balance-Leahy Scale: Good     Standing balance support: Bilateral upper extremity supported Standing balance-Leahy Scale: Fair Standing balance comment: able to release RW with i hand briefly                            Cognition Arousal/Alertness: Awake/alert Behavior During Therapy: WFL for tasks assessed/performed Overall Cognitive Status: Within Functional Limits for tasks assessed  Exercises General Exercises - Lower Extremity Quad Sets: AROM;10 reps;Left;Seated(L residual limb with padding underneath distal most aspect with a 2 second iso hold) Hip ABduction/ADduction: AROM;10 reps;Left;Seated    General Comments        Pertinent Vitals/Pain Pain Score: 7  Pain Location: L residual limb Pain  Descriptors / Indicators: Aching;Operative site guarding    Home Living                      Prior Function            PT Goals (current goals can now be found in the care plan section) Acute Rehab PT Goals Patient Stated Goal: go home PT Goal Formulation: With patient Time For Goal Achievement: 11/03/18 Potential to Achieve Goals: Good    Frequency    Min 3X/week      PT Plan Current plan remains appropriate    Co-evaluation              AM-PAC PT "6 Clicks" Mobility   Outcome Measure  Help needed turning from your back to your side while in a flat bed without using bedrails?: None Help needed moving from lying on your back to sitting on the side of a flat bed without using bedrails?: None Help needed moving to and from a bed to a chair (including a wheelchair)?: A Little Help needed standing up from a chair using your arms (e.g., wheelchair or bedside chair)?: A Little Help needed to walk in hospital room?: A Little Help needed climbing 3-5 steps with a railing? : A Lot 6 Click Score: 19    End of Session Equipment Utilized During Treatment: Gait belt Activity Tolerance: Patient tolerated treatment well Patient left: in chair;with call bell/phone within reach Nurse Communication: Mobility status PT Visit Diagnosis: Muscle weakness (generalized) (M62.81);Other abnormalities of gait and mobility (R26.89);Difficulty in walking, not elsewhere classified (R26.2);History of falling (Z91.81) Pain - Right/Left: Left Pain - part of body: Leg     Time: 1236-1300 PT Time Calculation (min) (ACUTE ONLY): 24 min  Charges:  $Gait Training: 8-22 mins $Therapeutic Exercise: 8-22 mins                     [name], SPTA   [name] 10/21/2018, 2:19 PM

## 2018-10-21 NOTE — Care Management (Signed)
Patient doesn't want CIR or HH. Wants Outpt therapy. Referral sent to [facility] that handles amputee therapy. Patient has DME from previous admission.

## 2018-10-21 NOTE — Progress Notes (Addendum)
Subjective: 2 Days Post-Op Procedure(s) (LRB): REVISION LEFT BELOW KNEE AMPUTATION (Left) Patient reports pain as moderate and severe.   Transfusion of pRBC ordered by IM. Objective: Vital signs in last 24 hours: Temp:  [98.4 F (36.9 C)-99.1 F (37.3 C)] 98.9 F (37.2 C) (03/13 0548) Pulse Rate:  [82-91] 91 (03/13 0548) Resp:  [18] 18 (03/13 0548) BP: (115-160)/(67-91) 142/67 (03/13 0548) SpO2:  [94 %-98 %] 95 % (03/13 0548)  Intake/Output from previous day: 03/12 0701 - 03/13 0700 In: 899 [P.O.:240; I.V.:517; IV Piggyback:142] Out: -  Intake/Output this shift: No intake/output data recorded.  Recent Labs    10/19/18 0712 10/20/18 0302 10/21/18 0323  HGB 8.0* 7.4* 6.9*   Recent Labs    10/20/18 0302 10/21/18 0323  WBC 9.1 8.6  RBC 2.52* 2.40*  HCT 23.0* 21.9*  PLT 319 311   Recent Labs    10/20/18 0302 10/21/18 0323  NA 134* 135  K 5.4* 4.9  CL 103 105  CO2 21* 22  BUN 77* 68*  CREATININE 7.46* 7.34*  GLUCOSE 216* 100*  CALCIUM 7.7* 8.0*   No results for input(s): LABPT, INR in the last 72 hours.  Left transtibial amputation site with VAC dressing in place and functioning well. No drainage in VAC canister.    Assessment/Plan: 2 Days Post-Op Procedure(s) (LRB): REVISION LEFT BELOW KNEE AMPUTATION (Left) Up with therapy Transfuse for anemia per IM service. On aranesp as noted.  Continue VAC dressing and plan Prevena VAC at DC.  Patient would like to DC home with [facility] as noted.  Reports poor pain control following revision surgery. Could increase gabapentin if okay with primary service.   Erlinda Hong, PA-C 10/21/2018, 8:47 AM  [place]

## 2018-10-21 NOTE — Telephone Encounter (Signed)
Hosp f/u per Dr Annie Paras; pt appt 03/17 1045am/NW

## 2018-10-21 NOTE — Progress Notes (Signed)
1 unit PRBC transfusion done, vital signs taken and recorded. No adverse reactions noted, tolerated well. Will continue to monitor.

## 2018-10-21 NOTE — Progress Notes (Signed)
Provided discharge education/instructions, all questions and concerns addressed, spoke with MD and was informed CBC not needed, Pt will d/c post blood transfusion, switched to [place] wound vac, Pt discharged home with belongings accompanied by daughters.

## 2018-10-21 NOTE — Care Management (Signed)
Ref[facility] for [facility] rehab [facility].     Ricki Miller, RN BSN Case Manager  [phone]

## 2018-10-21 NOTE — Plan of Care (Signed)
  Problem: Activity: Goal: Risk for activity intolerance will decrease Outcome: Progressing   Problem: Nutrition: Goal: Adequate nutrition will be maintained Outcome: Progressing   Problem: Elimination: Goal: Will not experience complications related to bowel motility Outcome: Progressing   Problem: Pain Managment: Goal: General experience of comfort will improve Outcome: Progressing   Problem: Safety: Goal: Ability to remain free from injury will improve Outcome: Progressing   Problem: Skin Integrity: Goal: Risk for impaired skin integrity will decrease Outcome: Progressing   

## 2018-10-21 NOTE — Progress Notes (Signed)
Subjective: Doing well this morning. Still having pain in her left leg. She does not wish to go to rehab facility. Plans to stay with her mom who has a ramp to get in and out of house. She states she is still having menstrual bleeding. She has had a two-week long period, which she states is abnormal for her. She has no history of fibroids. Discussed plan for unit of blood today, and she can likely go home later today. She is aware she will need close follow-up with nephrology. All questions and concerns were addressed.   Objective:  Vital signs in last 24 hours: Vitals:   10/20/18 0908 10/20/18 1344 10/20/18 2015 10/21/18 0548  BP: (!) 160/81 (!) 149/91 115/68 (!) 142/67  Pulse: 89 82 84 91  Resp: 18 18 18 18   Temp: 99.1 F (37.3 C) 98.4 F (36.9 C) 98.8 F (37.1 C) 98.9 F (37.2 C)  TempSrc: Oral Oral Oral Oral  SpO2: 96% 98% 94% 95%  Weight:      Height:       General: awake, alert, sitting up in bed in NAD CV: RRR; no murmurs Pulm: normal work of breathing; lungs CTAB Ext: L BKA stump with wound vac in place, no drainage in wound vac cannister   Assessment/Plan:  Principal Problem:   BKA stump complication (HCC) Active Problems:   Diabetes mellitus type 2 in obese (HCC)   Essential hypertension   Human immunodeficiency virus (HIV) disease ([place])   CKD (chronic kidney disease), stage V ([place])  Evanee Lubrano is a66 y.o. femalewith HIV, DM, CKD5, HTN and recent left BKA on2/29/2020who presented with a mechanical fall the day she was discharged from the hospital.Her left BKA surgical site was found to be openand she was admitted for revision with ortho.  Left BKA StumpWound Dehiscence -Underwent revision with ortho 3/11. Pt reports that she doesn't want CIR or SNF. Continues to have pain. Will avoid increasing gabapentin due to CKD. - Patient stable for discharge home today with home health and close f/u with the [facility].  Plan - Ortho following, appreciate recs -  Pain control: Oxycodone prn  - Methocarbamol500 mg q6hrs prnfor muscle spasms - Continue gabapentin 300mg  daily - Continue wound vac for one week. F/u with ortho 1 week post-op.  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 Likely combination of anemia 2/2 CKD as well as acute blood loss from her surgical site.  - Patient also has a history of heavy menses that have caused anemia requiring blood transfusions during past hospitalizations. She states that her current period has been ongoing for two weeks and she has been passing clots. Discussed with OB-GYN. They recommend starting megace and outpatient f/u with gyn. -Hb 6.9 this am Plan - Transfuse 1u RBCs - Start Megace 40mg  daily - Continuearanespper nephrology - Continue to hold DVT ppx in the setting of acute blood loss anemia  CKD stage V Hyperkalemia -s/p left brachiobasilic AVFon 68/34/1962 - K4.9 today. Cr and BUN still elevated,but stable. - Patient has reiterated to nephrology that she does not want an HD catheter placed at this time. They will follow her as an outpatient. Nephrology has signed off. Plan -Continuecalcitriol,PhosLo, andsodium bicarb - Lokelma 10g daily. Will transition to kayexalate on discharge due to cost.   DMII -Blood sugars within goal. Plan - Lantus 5u qhs -SSI   Hypertension  - BP142/67 this am Plan - Continue home Amlodipine 10 mg QD and labetalol 200  mg BID  HIV - Continue Dolutegravir-Rilpivirine  Dispo: Anticipated discharge today  Kathy Frank, [name], MD 10/21/2018, 6:46 AM Pager: [phone]

## 2018-10-22 LAB — TYPE AND SCREEN
ABO/RH(D): B POS
Antibody Screen: NEGATIVE
Unit division: 0

## 2018-10-22 LAB — BPAM RBC
Blood Product Expiration Date: 202003142359
ISSUE DATE / TIME: 202003131105
Unit Type and Rh: 1700

## 2018-10-25 ENCOUNTER — Ambulatory Visit: Payer: Medicaid Other

## 2018-10-25 NOTE — Progress Notes (Deleted)
   CC: ***  HPI:  [name] is a 43 y.o. female living with hiv, CKDV with left upper extremity fistula who presents for hospital follow up. Please see problem based charting for evaluation, assessment, and plan.  The patient was recently hospitalized 3/8-3/13/20 for left bka wound dehiscence after tripping on rug with walker subsequent to elective left bka. The patient had revision of left bka on 10/19/18.    ckd 5 The patient has left upper extremity av fistula placed on 10/14/18 that is waiting to mature.    Past Medical History:  Diagnosis Date  . CKD (chronic kidney disease) stage 5, GFR less than 15 ml/min (HCC) 06/19/2016   06/30/16: Seen by Dr. Posey Pronto [Nephrology]. Suspect etiologies to include diabetes and hypertension though cannot exclude HIV-associated nephropathy. Advised her against from using excessive Goody powder.  . Contact dermatitis 04/21/2016   07/29/16: Skin biopsy performed by [facility] notable for acute spongiotic dermatitis consistent with contact dermatitis.  . Cyclical vomiting    THC induced???  . Depression 06/28/2006   Qualifier: Diagnosis of  By: Riccardo Dubin MD, [place]    . Diabetes mellitus type 2 in obese ([place]) 06/28/1994  . DKA (diabetic ketoacidoses) ([place]) 12/2014  . Erosive esophagitis   . Esophageal reflux   . Essential hypertension 11/16/2013  . Gastroparesis    ? diabetic  . GERD (gastroesophageal reflux disease)   . Human immunodeficiency virus (HIV) disease ([place]) 04/23/2016  . Hypertension   . Moderate nonproliferative diabetic retinopathy of both eyes ([place]) 11/21/2014   11/14/14: Noted on retinal imaging; needs follow-up imaging in 6 months  05/22/16: Noted on retinal imaging again; needs follow-up imaging in 6 months  . Normocytic anemia   . TOBACCO USER 06/28/2006   Qualifier: Diagnosis of  By: Riccardo Dubin MD, Sherren Mocha    . Type II diabetes mellitus ([place]) 1994   diagnosed around 1994   Review of Systems:  ***  Physical Exam:   There were no vitals filed for this visit. ***  Assessment & Plan:   See Encounters Tab for problem based charting.  Patient {GC/GE:[id number]::"discussed with","seen with"} Dr. {NAMES:[id number]::"Butcher","Gr[place]","E. Hoffman","Klima","Mullen","Narendra","Raines","Vincent"}

## 2018-10-27 ENCOUNTER — Other Ambulatory Visit: Payer: Self-pay

## 2018-10-27 ENCOUNTER — Other Ambulatory Visit: Payer: Self-pay | Admitting: Oncology

## 2018-10-27 ENCOUNTER — Other Ambulatory Visit: Payer: Self-pay | Admitting: Internal Medicine

## 2018-10-27 ENCOUNTER — Inpatient Hospital Stay (HOSPITAL_COMMUNITY)
Admission: EM | Admit: 2018-10-27 | Discharge: 2018-10-30 | DRG: 908 | Disposition: A | Discharge status: Home / Self Care | Payer: Medicaid Other | Attending: Internal Medicine | Admitting: Internal Medicine

## 2018-10-27 DIAGNOSIS — T8131XA Disruption of external operation (surgical) wound, not elsewhere classified, initial encounter: Principal | ICD-10-CM | POA: Diagnosis present

## 2018-10-27 DIAGNOSIS — Y92009 Unspecified place in unspecified non-institutional (private) residence as the place of occurrence of the external cause: Secondary | ICD-10-CM

## 2018-10-27 DIAGNOSIS — I120 Hypertensive chronic kidney disease with stage 5 chronic kidney disease or end stage renal disease: Secondary | ICD-10-CM | POA: Diagnosis present

## 2018-10-27 DIAGNOSIS — Z833 Family history of diabetes mellitus: Secondary | ICD-10-CM

## 2018-10-27 DIAGNOSIS — D649 Anemia, unspecified: Secondary | ICD-10-CM | POA: Diagnosis present

## 2018-10-27 DIAGNOSIS — Z21 Asymptomatic human immunodeficiency virus [HIV] infection status: Secondary | ICD-10-CM | POA: Diagnosis present

## 2018-10-27 DIAGNOSIS — W010XXA Fall on same level from slipping, tripping and stumbling without subsequent striking against object, initial encounter: Secondary | ICD-10-CM | POA: Diagnosis present

## 2018-10-27 DIAGNOSIS — S88112A Complete traumatic amputation at level between knee and ankle, left lower leg, initial encounter: Secondary | ICD-10-CM

## 2018-10-27 DIAGNOSIS — B20 Human immunodeficiency virus [HIV] disease: Secondary | ICD-10-CM | POA: Diagnosis present

## 2018-10-27 DIAGNOSIS — E875 Hyperkalemia: Secondary | ICD-10-CM | POA: Diagnosis present

## 2018-10-27 DIAGNOSIS — K219 Gastro-esophageal reflux disease without esophagitis: Secondary | ICD-10-CM | POA: Diagnosis present

## 2018-10-27 DIAGNOSIS — Z810 Family history of intellectual disabilities: Secondary | ICD-10-CM

## 2018-10-27 DIAGNOSIS — N185 Chronic kidney disease, stage 5: Secondary | ICD-10-CM | POA: Diagnosis present

## 2018-10-27 DIAGNOSIS — Z794 Long term (current) use of insulin: Secondary | ICD-10-CM

## 2018-10-27 DIAGNOSIS — E1122 Type 2 diabetes mellitus with diabetic chronic kidney disease: Secondary | ICD-10-CM | POA: Diagnosis present

## 2018-10-27 DIAGNOSIS — E113393 Type 2 diabetes mellitus with moderate nonproliferative diabetic retinopathy without macular edema, bilateral: Secondary | ICD-10-CM | POA: Diagnosis present

## 2018-10-27 DIAGNOSIS — T8130XA Disruption of wound, unspecified, initial encounter: Secondary | ICD-10-CM | POA: Diagnosis present

## 2018-10-27 DIAGNOSIS — Y835 Amputation of limb(s) as the cause of abnormal reaction of the patient, or of later complication, without mention of misadventure at the time of the procedure: Secondary | ICD-10-CM | POA: Diagnosis present

## 2018-10-27 DIAGNOSIS — E1143 Type 2 diabetes mellitus with diabetic autonomic (poly)neuropathy: Secondary | ICD-10-CM | POA: Diagnosis present

## 2018-10-27 DIAGNOSIS — Z79899 Other long term (current) drug therapy: Secondary | ICD-10-CM

## 2018-10-27 DIAGNOSIS — K3184 Gastroparesis: Secondary | ICD-10-CM | POA: Diagnosis present

## 2018-10-27 DIAGNOSIS — Z87891 Personal history of nicotine dependence: Secondary | ICD-10-CM

## 2018-10-27 DIAGNOSIS — E1151 Type 2 diabetes mellitus with diabetic peripheral angiopathy without gangrene: Secondary | ICD-10-CM | POA: Diagnosis present

## 2018-10-27 DIAGNOSIS — F329 Major depressive disorder, single episode, unspecified: Secondary | ICD-10-CM | POA: Diagnosis present

## 2018-10-27 MED ORDER — OXYCODONE HCL 5 MG PO TABS
10.0000 mg | ORAL_TABLET | Freq: Once | ORAL | Status: AC
  Administered 2018-10-27: 10 mg via ORAL
  Filled 2018-10-27: qty 2

## 2018-10-27 NOTE — ED Triage Notes (Signed)
Pt went to take something out of the freezer and she felt off balance and fell. Pt recently had a left BKA on feb 29th. Pt hit left leg on floor causing wound vac to come off of leg.

## 2018-10-27 NOTE — ED Provider Notes (Signed)
[facility] EMERGENCY DEPARTMENT Provider Note   CSN: [id number] Arrival date & time: 10/27/18  2126    History   Chief Complaint Chief Complaint  Patient presents with  . Fall  . Wound Check    HPI Kathy Frank is a 43 y.o. 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 Sharol Given presents to the Emergency Department complaining of acute, persistent pain and bleeding of the left BKA after mechanical fall onset approximately 1 hour ago.  Patient reports she was trying to get something out of the freezer and despite using her walker lost her balance landing on her left leg.  She denies hitting her head or loss of consciousness.  She states that almost immediately her wound VAC filled with blood and has continued to bleed.  She reports her pain is a 10/10 and her last oxycodone was taken this morning.  Record review shows that patient had fall on 10/16/2018 and was admitted for acute on chronic anemia and wound dehiscence.  She did have a revision of her BKA at that time.     The history is provided by the patient and medical records.    Past Medical History:  Diagnosis Date  . CKD (chronic kidney disease) stage 5, GFR less than 15 ml/min (HCC) 06/19/2016   06/30/16: Seen by Dr. Posey Pronto [Nephrology]. Suspect etiologies to include diabetes and hypertension though cannot exclude HIV-associated nephropathy. Advised her against from using excessive Goody powder.  . Contact dermatitis 04/21/2016   07/29/16: Skin biopsy performed by [facility] notable for acute spongiotic dermatitis consistent with contact dermatitis.  . Cyclical vomiting    THC induced???  . Depression 06/28/2006   Qualifier: Diagnosis of  By: Riccardo Dubin MD, Todd    . Diabetes mellitus type 2 in obese ([place]) 06/28/1994  . DKA (diabetic ketoacidoses) ([place]) 12/2014  . Erosive esophagitis   . Esophageal reflux   .  Essential hypertension 11/16/2013  . Gastroparesis    ? diabetic  . GERD (gastroesophageal reflux disease)   . Human immunodeficiency virus (HIV) disease ([place]) 04/23/2016  . Hypertension   . Moderate nonproliferative diabetic retinopathy of both eyes ([place]) 11/21/2014   11/14/14: Noted on retinal imaging; needs follow-up imaging in 6 months  05/22/16: Noted on retinal imaging again; needs follow-up imaging in 6 months  . Normocytic anemia   . TOBACCO USER 06/28/2006   Qualifier: Diagnosis of  By: Riccardo Dubin MD, Sherren Mocha    . Type II diabetes mellitus ([place]) 1994   diagnosed around 1994    Patient Active Problem List   Diagnosis Date Noted  . BKA stump complication ([place]) 28/41/3244  . Osteomyelitis ([place]) 10/07/2018  . Dehiscence of amputation stump ([place])   . Moderate protein-calorie malnutrition ([place])   . Adjustment disorder with mixed anxiety and depressed mood 08/15/2018  . Wound dehiscence s/p L transmetatarsal amputation   . Wound infection s/p L transmetatarsal amputation   . CKD (chronic kidney disease), stage V ([place])   . Acute on chronic anemia 07/03/2018  . Fecal incontinence 02/04/2018  . Healthcare maintenance 09/17/2017  . Right leg swelling 02/26/2017  . Superficial ulcerative lesion ([place]) 02/02/2017  . Aortic atherosclerosis ([place]) 09/30/2016  . Diabetic foot ulcer associated with diabetes mellitus due to underlying condition ([place]) 07/08/2016  . Human immunodeficiency virus (HIV) disease ([place]) 04/23/2016  . Gastroparesis   . Moderate nonproliferative diabetic retinopathy of both  eyes ([place]) 11/21/2014  . Esophageal reflux   . Microscopic hematuria 10/31/2014  . Essential hypertension 11/16/2013  . TOBACCO USER 06/28/2006  . Depression 06/28/2006  . Diabetes mellitus type 2 in obese [facility]) 06/28/1994    Past Surgical History:  Procedure Laterality Date  . AMPUTATION Left 07/08/2018   Procedure: AMPUTATION FORTH RAY LEFT FOOT;  Surgeon: [name], MD;  Location: [facility];   Service: Orthopedics;  Laterality: Left;  . AMPUTATION Left 08/09/2018   Procedure: Left Transmetatarsal Amputation;  Surgeon: [name], MD;  Location: [place] [facility] [facility];  Service: Orthopedics;  Laterality: Left;  . AMPUTATION Left 10/08/2018   Procedure: LEFT BELOW KNEE AMPUTATION;  Surgeon: [name], MD;  Location: [place];  Service: Orthopedics;  Laterality: Left;  . AV FISTULA PLACEMENT Left 10/14/2018   Procedure: Arteriovenous (Av) Fistula Creation Left Arm;  Surgeon: [name], MD;  Location: [facility];  Service: Vascular;  Laterality: Left;  . LOWER EXTREMITY ANGIOGRAPHY N/A 07/05/2018   Procedure: LOWER EXTREMITY ANGIOGRAPHY;  Surgeon: Serafina Mitchell, MD;  Location: [facility] CV [facility];  Service: Cardiovascular;  Laterality: N/A;  . PERIPHERAL VASCULAR BALLOON ANGIOPLASTY Left 07/05/2018   Procedure: PERIPHERAL VASCULAR BALLOON ANGIOPLASTY;  Surgeon: Serafina Mitchell, MD;  Location: [facility];  Service: Cardiovascular;  Laterality: Left;  SFA  . STUMP REVISION Left 10/19/2018   Procedure: REVISION LEFT BELOW KNEE AMPUTATION;  Surgeon: [name], MD;  Location: [facility];  Service: Orthopedics;  Laterality: Left;  . TUBAL LIGATION  2002     OB History   No obstetric history on file.      Home Medications    Prior to Admission medications   Medication Sig Start Date End Date Taking? Authorizing Provider  amLODipine (NORVASC) 10 MG tablet Take 1 tablet (10 mg total) by mouth daily. 12/24/17 10/27/27 Yes Rice, Resa Miner, MD  calcitRIOL (ROCALTROL) 0.25 MCG capsule Take 1 capsule (0.25 mcg total) by mouth daily. 10/17/18  Yes [name], MD  calcium acetate (PHOSLO) 667 MG capsule Take 2 capsules (1,334 mg total) by mouth 3 (three) times daily with meals. 10/16/18  Yes [name], MD  citalopram (CELEXA) 20 MG tablet Take 1 tablet (20 mg total) by mouth daily. 08/12/18 10/27/27 Yes Carroll Sage, MD  Darbepoetin Alfa (ARANESP) 60 MCG/0.3ML SOSY injection  Inject 0.3 mLs (60 mcg total) into the skin every Tuesday at 6 PM. 10/18/18  Yes Harbrecht, Purcell Nails, MD  Dolutegravir-Rilpivirine (JULUCA) 50-25 MG TABS Take 1 tablet by mouth daily. Please take with largest meal of the day. 07/05/18  Yes Annia Belt, MD  gabapentin (NEURONTIN) 300 MG capsule Take 1 capsule (300 mg total) by mouth daily. 10/17/18  Yes [name], MD  Insulin Glargine (LANTUS) 100 UNIT/ML Solostar Pen Inject 5 Units into the skin at bedtime. 10/16/18  Yes [name], MD  labetalol (NORMODYNE) 200 MG tablet Take 1 tablet (200 mg total) by mouth 2 (two) times daily. 10/21/18  Yes [name], MD  liraglutide (VICTOZA) 18 MG/3ML SOPN Inject 0.2 mLs (1.2 mg total) into the skin daily. 02/04/18  Yes Chundi, Vahini, MD  megestrol (MEGACE) 40 MG tablet Take 1 tablet (40 mg total) by mouth daily. 10/21/18  Yes [name], MD  methocarbamol (ROBAXIN) 500 MG tablet Take 1 tablet (500 mg total) by mouth every 6 (six) hours as needed for muscle spasms. 10/21/18  Yes [name], MD  nutrition supplement, JUVEN, (JUVEN) PACK Take 1 packet  by mouth 2 (two) times daily between meals. 10/16/18  Yes [name], MD  ondansetron (ZOFRAN) 4 MG tablet Take 1 tablet (4 mg total) by mouth daily as needed for nausea or vomiting. 08/19/18 08/19/19 Yes Alphonzo Grieve, MD  polyethylene glycol (MIRALAX / GLYCOLAX) packet Take 17 g by mouth daily. Patient taking differently: Take 17 g by mouth daily as needed for mild constipation.  08/12/18  Yes Carroll Sage, MD  senna (SENOKOT) 8.6 MG TABS tablet Take 2 tablets (17.2 mg total) by mouth 2 (two) times daily. Patient taking differently: Take 2 tablets by mouth daily as needed for mild constipation.  08/12/18  Yes Carroll Sage, MD  sodium bicarbonate 650 MG tablet Take 2 tablets (1,300 mg total) by mouth 2 (two) times daily. 10/16/18  Yes [name], MD  sodium polystyrene (KAYEXALATE) 15 GM/60ML suspension  Take 60 mLs (15 g total) by mouth daily. 10/21/18  Yes Harbrecht, Purcell Nails, MD  glucose blood (ACCU-CHEK GUIDE) test strip USE TO TEST BLOOD SUGAR 3 TIMES DAILY . Diagnosis code  E11.69, E66.9 07/09/17   Lars Mage, MD    Family History Family History  Problem Relation Age of Onset  . Diabetes Mother   . Diabetes Brother   . Diabetes Daughter   . Diabetes Daughter   . Mental retardation Brother        died from PNA  . Diabetes Maternal Grandmother     Social History Social History   Tobacco Use  . Smoking status: Former Smoker    Packs/day: 0.10    Years: 10.00    Pack years: 1.00    Types: Cigarettes    Start date: 11/17/2013    Last attempt to quit: 09/09/2014    Years since quitting: 4.1  . Smokeless tobacco: Never Used  Substance Use Topics  . Alcohol use: Yes    Comment: Occasionally.  . Drug use: Yes    Frequency: 4.0 times per week    Types: Marijuana     Allergies   Patient has no known allergies.   Review of Systems Review of Systems  Constitutional: Negative for appetite change, diaphoresis, fatigue, fever and unexpected weight change.  HENT: Negative for mouth sores.   Eyes: Negative for visual disturbance.  Respiratory: Negative for cough, chest tightness, shortness of breath and wheezing.   Cardiovascular: Negative for chest pain.  Gastrointestinal: Negative for abdominal pain, constipation, diarrhea, nausea and vomiting.  Endocrine: Negative for polydipsia, polyphagia and polyuria.  Genitourinary: Negative for dysuria, frequency, hematuria and urgency.  Musculoskeletal: Positive for arthralgias and myalgias. Negative for back pain, neck pain and neck stiffness.  Skin: Negative for rash.  Allergic/Immunologic: Negative for immunocompromised state.  Neurological: Negative for syncope, light-headedness and headaches.  Hematological: Does not bruise/bleed easily.  Psychiatric/Behavioral: Negative for sleep disturbance. The patient is not  nervous/anxious.      Physical Exam Updated Vital Signs BP (!) 132/94   Pulse 96   Temp 99 F (37.2 C) (Oral)   Resp 20   Ht 5\' 5"  (1.651 m)   Wt 74.8 kg   SpO2 99%   BMI 27.46 kg/m   Physical Exam Vitals signs and nursing note reviewed.  Constitutional:      General: She is not in acute distress.    Appearance: She is well-developed. She is not diaphoretic.     Comments: Awake, alert, nontoxic appearance  HENT:     Head: Normocephalic and atraumatic.     Mouth/Throat:  Pharynx: No oropharyngeal exudate.  Eyes:     General: No scleral icterus.    Conjunctiva/sclera: Conjunctivae normal.  Neck:     Musculoskeletal: Normal range of motion and neck supple.  Cardiovascular:     Rate and Rhythm: Normal rate and regular rhythm.  Pulmonary:     Effort: Pulmonary effort is normal.  Abdominal:     General: There is no distension.  Musculoskeletal: Normal range of motion.     Comments: FROM of the left hip and knee without significant pain. Wound vac in place with persistent bleeding and 217mL of blood in the container.   Wound vac removed with 3cm dehiscence to the central portion of the wound.  Persistent venous oozing from the site.  Pressure dressing placed.     Skin:    General: Skin is warm and dry.  Neurological:     Mental Status: She is alert.     Comments: Speech is clear and goal oriented Moves extremities without ataxia      ED Treatments / Results  Labs (all labs ordered are listed, but only abnormal results are displayed) Labs Reviewed  CBC WITH DIFFERENTIAL/PLATELET - Abnormal; Notable for the following components:      Result Value   WBC 12.0 (*)    RBC 2.61 (*)    Hemoglobin 7.6 (*)    HCT 25.2 (*)    Neutro Abs 8.6 (*)    Eosinophils Absolute 1.0 (*)    All other components within normal limits  BASIC METABOLIC PANEL - Abnormal; Notable for the following components:   CO2 19 (*)    Glucose, Bld 136 (*)    BUN 40 (*)    Creatinine, Ser  5.78 (*)    Calcium 8.6 (*)    GFR calc non Af Amer 8 (*)    GFR calc Af Amer 10 (*)    All other components within normal limits  TYPE AND SCREEN    Procedures Procedures (including critical care time)  Medications Ordered in ED Medications  oxyCODONE (Oxy IR/ROXICODONE) immediate release tablet 10 mg (10 mg Oral Given 10/27/18 2329)     Initial Impression / Assessment and Plan / ED Course  I have reviewed the triage vital signs and the nursing notes.  Pertinent labs & imaging results that were available during my care of the patient were reviewed by me and considered in my medical decision making (see chart for details).         Patient presents after mechanical fall for pain in her left BKA.  Patient had recent fall on 10/16/2018 that required admission and revision.  Patient with greater than 500 mL's of blood output from wound dehiscence.  Patient with a history of anemia due to her chronic kidney disease however worsening tonight.  Wound VAC removed and pressure dressing placed with cessation of bleeding.  No squirting from the wound.  Patient will need admission for trending of her hemoglobin and evaluation of wound by orthopedics.  Discussed with internal medicine teaching service resident who will admit and consult orthopedics in the morning.   Final Clinical Impressions(s) / ED Diagnoses   Final diagnoses:  Fall due to stumbling, initial encounter  Wound dehiscence    ED Discharge Orders    None       Lanore Renderos, Gwenlyn Perking 10/28/18 0144    Palumbo, April, MD 10/28/18 9924

## 2018-10-28 ENCOUNTER — Observation Stay (HOSPITAL_COMMUNITY): Payer: Medicaid Other | Admitting: Certified Registered Nurse Anesthetist

## 2018-10-28 ENCOUNTER — Encounter (HOSPITAL_COMMUNITY)
Admission: EM | Disposition: A | Discharge status: Home / Self Care | Payer: Self-pay | Source: Home / Self Care | Attending: Internal Medicine

## 2018-10-28 DIAGNOSIS — Y92009 Unspecified place in unspecified non-institutional (private) residence as the place of occurrence of the external cause: Secondary | ICD-10-CM | POA: Diagnosis not present

## 2018-10-28 DIAGNOSIS — R011 Cardiac murmur, unspecified: Secondary | ICD-10-CM

## 2018-10-28 DIAGNOSIS — Z978 Presence of other specified devices: Secondary | ICD-10-CM

## 2018-10-28 DIAGNOSIS — Z79899 Other long term (current) drug therapy: Secondary | ICD-10-CM | POA: Diagnosis not present

## 2018-10-28 DIAGNOSIS — I120 Hypertensive chronic kidney disease with stage 5 chronic kidney disease or end stage renal disease: Secondary | ICD-10-CM

## 2018-10-28 DIAGNOSIS — T8131XA Disruption of external operation (surgical) wound, not elsewhere classified, initial encounter: Secondary | ICD-10-CM | POA: Diagnosis present

## 2018-10-28 DIAGNOSIS — Z87891 Personal history of nicotine dependence: Secondary | ICD-10-CM

## 2018-10-28 DIAGNOSIS — Z833 Family history of diabetes mellitus: Secondary | ICD-10-CM | POA: Diagnosis not present

## 2018-10-28 DIAGNOSIS — N185 Chronic kidney disease, stage 5: Secondary | ICD-10-CM

## 2018-10-28 DIAGNOSIS — Z21 Asymptomatic human immunodeficiency virus [HIV] infection status: Secondary | ICD-10-CM | POA: Diagnosis present

## 2018-10-28 DIAGNOSIS — K219 Gastro-esophageal reflux disease without esophagitis: Secondary | ICD-10-CM

## 2018-10-28 DIAGNOSIS — T8130XA Disruption of wound, unspecified, initial encounter: Secondary | ICD-10-CM | POA: Diagnosis not present

## 2018-10-28 DIAGNOSIS — T8781 Dehiscence of amputation stump: Secondary | ICD-10-CM

## 2018-10-28 DIAGNOSIS — B20 Human immunodeficiency virus [HIV] disease: Secondary | ICD-10-CM

## 2018-10-28 DIAGNOSIS — Z89512 Acquired absence of left leg below knee: Secondary | ICD-10-CM

## 2018-10-28 DIAGNOSIS — E1143 Type 2 diabetes mellitus with diabetic autonomic (poly)neuropathy: Secondary | ICD-10-CM | POA: Diagnosis present

## 2018-10-28 DIAGNOSIS — E1122 Type 2 diabetes mellitus with diabetic chronic kidney disease: Secondary | ICD-10-CM

## 2018-10-28 DIAGNOSIS — Z794 Long term (current) use of insulin: Secondary | ICD-10-CM

## 2018-10-28 DIAGNOSIS — F329 Major depressive disorder, single episode, unspecified: Secondary | ICD-10-CM | POA: Diagnosis present

## 2018-10-28 DIAGNOSIS — Z810 Family history of intellectual disabilities: Secondary | ICD-10-CM | POA: Diagnosis not present

## 2018-10-28 DIAGNOSIS — E113393 Type 2 diabetes mellitus with moderate nonproliferative diabetic retinopathy without macular edema, bilateral: Secondary | ICD-10-CM | POA: Diagnosis present

## 2018-10-28 DIAGNOSIS — W010XXA Fall on same level from slipping, tripping and stumbling without subsequent striking against object, initial encounter: Secondary | ICD-10-CM

## 2018-10-28 DIAGNOSIS — E1151 Type 2 diabetes mellitus with diabetic peripheral angiopathy without gangrene: Secondary | ICD-10-CM | POA: Diagnosis present

## 2018-10-28 DIAGNOSIS — K3184 Gastroparesis: Secondary | ICD-10-CM | POA: Diagnosis present

## 2018-10-28 DIAGNOSIS — S88112A Complete traumatic amputation at level between knee and ankle, left lower leg, initial encounter: Secondary | ICD-10-CM

## 2018-10-28 DIAGNOSIS — Y835 Amputation of limb(s) as the cause of abnormal reaction of the patient, or of later complication, without mention of misadventure at the time of the procedure: Secondary | ICD-10-CM | POA: Diagnosis present

## 2018-10-28 DIAGNOSIS — D649 Anemia, unspecified: Secondary | ICD-10-CM | POA: Diagnosis present

## 2018-10-28 DIAGNOSIS — E875 Hyperkalemia: Secondary | ICD-10-CM | POA: Diagnosis present

## 2018-10-28 HISTORY — PX: AMPUTATION: SHX166

## 2018-10-28 LAB — CBC WITH DIFFERENTIAL/PLATELET
Abs Immature Granulocytes: 0.04 10*3/uL (ref 0.00–0.07)
Basophils Absolute: 0.1 10*3/uL (ref 0.0–0.1)
Basophils Relative: 1 %
Eosinophils Absolute: 1 10*3/uL — ABNORMAL HIGH (ref 0.0–0.5)
Eosinophils Relative: 8 %
HCT: 25.2 % — ABNORMAL LOW (ref 36.0–46.0)
Hemoglobin: 7.6 g/dL — ABNORMAL LOW (ref 12.0–15.0)
Immature Granulocytes: 0 %
Lymphocytes Relative: 14 %
Lymphs Abs: 1.7 10*3/uL (ref 0.7–4.0)
MCH: 29.1 pg (ref 26.0–34.0)
MCHC: 30.2 g/dL (ref 30.0–36.0)
MCV: 96.6 fL (ref 80.0–100.0)
Monocytes Absolute: 0.7 10*3/uL (ref 0.1–1.0)
Monocytes Relative: 6 %
Neutro Abs: 8.6 10*3/uL — ABNORMAL HIGH (ref 1.7–7.7)
Neutrophils Relative %: 71 %
Platelets: 398 10*3/uL (ref 150–400)
RBC: 2.61 MIL/uL — ABNORMAL LOW (ref 3.87–5.11)
RDW: 14.7 % (ref 11.5–15.5)
WBC: 12 10*3/uL — ABNORMAL HIGH (ref 4.0–10.5)
nRBC: 0 % (ref 0.0–0.2)

## 2018-10-28 LAB — BASIC METABOLIC PANEL
Anion gap: 14 (ref 5–15)
BUN: 40 mg/dL — ABNORMAL HIGH (ref 6–20)
CO2: 19 mmol/L — ABNORMAL LOW (ref 22–32)
Calcium: 8.6 mg/dL — ABNORMAL LOW (ref 8.9–10.3)
Chloride: 105 mmol/L (ref 98–111)
Creatinine, Ser: 5.78 mg/dL — ABNORMAL HIGH (ref 0.44–1.00)
GFR calc Af Amer: 10 mL/min — ABNORMAL LOW (ref >60)
GFR calc non Af Amer: 8 mL/min — ABNORMAL LOW (ref >60)
Glucose, Bld: 136 mg/dL — ABNORMAL HIGH (ref 70–99)
Potassium: 5 mmol/L (ref 3.5–5.1)
Sodium: 138 mmol/L (ref 135–145)

## 2018-10-28 LAB — CBC
HCT: 23.6 % — ABNORMAL LOW (ref 36.0–46.0)
HCT: 20.8 % — ABNORMAL LOW (ref 36.0–46.0)
Hemoglobin: 7.3 g/dL — ABNORMAL LOW (ref 12.0–15.0)
Hemoglobin: 6.4 g/dL — CL (ref 12.0–15.0)
MCH: 29.2 pg (ref 26.0–34.0)
MCH: 28.6 pg (ref 26.0–34.0)
MCHC: 30.9 g/dL (ref 30.0–36.0)
MCHC: 30.8 g/dL (ref 30.0–36.0)
MCV: 94.4 fL (ref 80.0–100.0)
MCV: 92.9 fL (ref 80.0–100.0)
Platelets: 384 10*3/uL (ref 150–400)
Platelets: 362 10*3/uL (ref 150–400)
RBC: 2.5 MIL/uL — ABNORMAL LOW (ref 3.87–5.11)
RBC: 2.24 MIL/uL — ABNORMAL LOW (ref 3.87–5.11)
RDW: 14.9 % (ref 11.5–15.5)
RDW: 14.9 % (ref 11.5–15.5)
WBC: 10.1 10*3/uL (ref 4.0–10.5)
WBC: 11.7 10*3/uL — ABNORMAL HIGH (ref 4.0–10.5)
nRBC: 0 % (ref 0.0–0.2)
nRBC: 0 % (ref 0.0–0.2)

## 2018-10-28 LAB — RENAL FUNCTION PANEL
Albumin: 2.3 g/dL — ABNORMAL LOW (ref 3.5–5.0)
Anion gap: 10 (ref 5–15)
BUN: 40 mg/dL — ABNORMAL HIGH (ref 6–20)
CO2: 20 mmol/L — ABNORMAL LOW (ref 22–32)
Calcium: 8.2 mg/dL — ABNORMAL LOW (ref 8.9–10.3)
Chloride: 110 mmol/L (ref 98–111)
Creatinine, Ser: 5.89 mg/dL — ABNORMAL HIGH (ref 0.44–1.00)
GFR calc Af Amer: 9 mL/min — ABNORMAL LOW (ref >60)
GFR calc non Af Amer: 8 mL/min — ABNORMAL LOW (ref >60)
Glucose, Bld: 70 mg/dL (ref 70–99)
Phosphorus: 4.7 mg/dL — ABNORMAL HIGH (ref 2.5–4.6)
Potassium: 4.5 mmol/L (ref 3.5–5.1)
Sodium: 140 mmol/L (ref 135–145)

## 2018-10-28 LAB — GLUCOSE, CAPILLARY
Glucose-Capillary: 154 mg/dL — ABNORMAL HIGH (ref 70–99)
Glucose-Capillary: 107 mg/dL — ABNORMAL HIGH (ref 70–99)
Glucose-Capillary: 61 mg/dL — ABNORMAL LOW (ref 70–99)
Glucose-Capillary: 93 mg/dL (ref 70–99)
Glucose-Capillary: 98 mg/dL (ref 70–99)
Glucose-Capillary: 140 mg/dL — ABNORMAL HIGH (ref 70–99)
Glucose-Capillary: 196 mg/dL — ABNORMAL HIGH (ref 70–99)
Glucose-Capillary: 261 mg/dL — ABNORMAL HIGH (ref 70–99)

## 2018-10-28 LAB — HCG, SERUM, QUALITATIVE: Preg, Serum: NEGATIVE

## 2018-10-28 SURGERY — AMPUTATION BELOW KNEE
Anesthesia: General | Site: Leg Upper | Laterality: Left

## 2018-10-28 MED ORDER — POLYETHYLENE GLYCOL 3350 17 G PO PACK: 17.0000 g | PACK | Freq: Every day | ORAL | Status: DC | PRN

## 2018-10-28 MED ORDER — SUCCINYLCHOLINE CHLORIDE 200 MG/10ML IV SOSY
PREFILLED_SYRINGE | INTRAVENOUS | Status: DC | PRN
  Administered 2018-10-28: 160 mg via INTRAVENOUS

## 2018-10-28 MED ORDER — CITALOPRAM HYDROBROMIDE 20 MG PO TABS
20.0000 mg | ORAL_TABLET | Freq: Every day | ORAL | Status: DC
  Administered 2018-10-29 – 2018-10-30 (×2): 20 mg via ORAL
  Filled 2018-10-28 (×2): qty 1

## 2018-10-28 MED ORDER — ONDANSETRON HCL 4 MG/2ML IJ SOLN
INTRAMUSCULAR | Status: AC
  Filled 2018-10-28: qty 2

## 2018-10-28 MED ORDER — ACETAMINOPHEN 325 MG PO TABS
650.0000 mg | ORAL_TABLET | Freq: Four times a day (QID) | ORAL | Status: DC | PRN
  Administered 2018-10-28: 650 mg via ORAL
  Filled 2018-10-28: qty 2

## 2018-10-28 MED ORDER — MIDAZOLAM HCL 2 MG/2ML IJ SOLN
INTRAMUSCULAR | Status: DC | PRN
  Administered 2018-10-28: 1 mg via INTRAVENOUS

## 2018-10-28 MED ORDER — SODIUM CHLORIDE 0.9% IV SOLUTION
Freq: Once | INTRAVENOUS | Status: AC
  Administered 2018-10-28: 22:00:00 via INTRAVENOUS

## 2018-10-28 MED ORDER — DEXAMETHASONE SODIUM PHOSPHATE 10 MG/ML IJ SOLN
INTRAMUSCULAR | Status: AC
  Filled 2018-10-28: qty 1

## 2018-10-28 MED ORDER — LIDOCAINE 2% (20 MG/ML) 5 ML SYRINGE
INTRAMUSCULAR | Status: AC
  Filled 2018-10-28: qty 5

## 2018-10-28 MED ORDER — HYDROCODONE-ACETAMINOPHEN 7.5-325 MG PO TABS
1.0000 | ORAL_TABLET | Freq: Four times a day (QID) | ORAL | Status: DC | PRN
  Administered 2018-10-28: 1 via ORAL
  Filled 2018-10-28: qty 1

## 2018-10-28 MED ORDER — MEGESTROL ACETATE 40 MG PO TABS
40.0000 mg | ORAL_TABLET | Freq: Every day | ORAL | Status: DC
  Administered 2018-10-29 – 2018-10-30 (×2): 40 mg via ORAL
  Filled 2018-10-28 (×3): qty 1

## 2018-10-28 MED ORDER — ONDANSETRON HCL 4 MG/2ML IJ SOLN: 4.0000 mg | Freq: Four times a day (QID) | INTRAMUSCULAR | Status: DC | PRN

## 2018-10-28 MED ORDER — SODIUM CHLORIDE 0.9% IV SOLUTION: Freq: Once | INTRAVENOUS | Status: DC

## 2018-10-28 MED ORDER — ACETAMINOPHEN 650 MG RE SUPP: 650.0000 mg | Freq: Four times a day (QID) | RECTAL | Status: DC | PRN

## 2018-10-28 MED ORDER — METHOCARBAMOL 500 MG PO TABS
500.0000 mg | ORAL_TABLET | Freq: Four times a day (QID) | ORAL | Status: DC | PRN
  Administered 2018-10-28 – 2018-10-30 (×5): 500 mg via ORAL
  Filled 2018-10-28 (×5): qty 1

## 2018-10-28 MED ORDER — PROPOFOL 10 MG/ML IV BOLUS
INTRAVENOUS | Status: AC
  Filled 2018-10-28: qty 20

## 2018-10-28 MED ORDER — DOLUTEGRAVIR SODIUM 50 MG PO TABS
50.0000 mg | ORAL_TABLET | Freq: Every day | ORAL | Status: DC
  Administered 2018-10-29 – 2018-10-30 (×2): 50 mg via ORAL
  Filled 2018-10-28 (×3): qty 1

## 2018-10-28 MED ORDER — SODIUM CHLORIDE 0.9 % IV SOLN: 250.0000 mL | INTRAVENOUS | Status: DC | PRN

## 2018-10-28 MED ORDER — DOLUTEGRAVIR-RILPIVIRINE 50-25 MG PO TABS: 1.0000 | ORAL_TABLET | Freq: Every day | ORAL | Status: DC

## 2018-10-28 MED ORDER — CEFAZOLIN SODIUM-DEXTROSE 2-3 GM-%(50ML) IV SOLR
INTRAVENOUS | Status: DC | PRN
  Administered 2018-10-28: 2 g via INTRAVENOUS

## 2018-10-28 MED ORDER — LIDOCAINE 2% (20 MG/ML) 5 ML SYRINGE
INTRAMUSCULAR | Status: DC | PRN
  Administered 2018-10-28: 100 mg via INTRAVENOUS

## 2018-10-28 MED ORDER — PHENYLEPHRINE 40 MCG/ML (10ML) SYRINGE FOR IV PUSH (FOR BLOOD PRESSURE SUPPORT)
PREFILLED_SYRINGE | INTRAVENOUS | Status: AC
  Filled 2018-10-28: qty 20

## 2018-10-28 MED ORDER — CHLORHEXIDINE GLUCONATE 4 % EX LIQD: 60.0000 mL | Freq: Once | CUTANEOUS | Status: DC

## 2018-10-28 MED ORDER — INSULIN ASPART 100 UNIT/ML ~~LOC~~ SOLN
0.0000 [IU] | SUBCUTANEOUS | Status: DC
  Administered 2018-10-28 (×2): 3 [IU] via SUBCUTANEOUS
  Administered 2018-10-28: 8 [IU] via SUBCUTANEOUS
  Administered 2018-10-29: 5 [IU] via SUBCUTANEOUS
  Administered 2018-10-29 – 2018-10-30 (×5): 3 [IU] via SUBCUTANEOUS
  Administered 2018-10-30: 2 [IU] via SUBCUTANEOUS

## 2018-10-28 MED ORDER — HYDROCODONE-ACETAMINOPHEN 7.5-325 MG PO TABS: 1.0000 | ORAL_TABLET | Freq: Four times a day (QID) | ORAL | Status: DC | PRN

## 2018-10-28 MED ORDER — DEXTROSE 50 % IV SOLN
INTRAVENOUS | Status: AC
  Administered 2018-10-28: 25 mL
  Filled 2018-10-28: qty 50

## 2018-10-28 MED ORDER — METHOCARBAMOL 500 MG PO TABS
500.0000 mg | ORAL_TABLET | Freq: Four times a day (QID) | ORAL | Status: DC | PRN
  Administered 2018-10-28: 500 mg via ORAL
  Filled 2018-10-28: qty 1

## 2018-10-28 MED ORDER — METHOCARBAMOL 1000 MG/10ML IJ SOLN
500.0000 mg | Freq: Four times a day (QID) | INTRAVENOUS | Status: DC | PRN
  Filled 2018-10-28: qty 5

## 2018-10-28 MED ORDER — SODIUM CHLORIDE 0.9% FLUSH
3.0000 mL | Freq: Two times a day (BID) | INTRAVENOUS | Status: DC
  Administered 2018-10-28 – 2018-10-30 (×4): 3 mL via INTRAVENOUS

## 2018-10-28 MED ORDER — DEXAMETHASONE SODIUM PHOSPHATE 10 MG/ML IJ SOLN
INTRAMUSCULAR | Status: DC | PRN
  Administered 2018-10-28: 5 mg via INTRAVENOUS

## 2018-10-28 MED ORDER — BISACODYL 10 MG RE SUPP: 10.0000 mg | Freq: Every day | RECTAL | Status: DC | PRN

## 2018-10-28 MED ORDER — EPHEDRINE 5 MG/ML INJ
INTRAVENOUS | Status: AC
  Filled 2018-10-28: qty 20

## 2018-10-28 MED ORDER — FENTANYL CITRATE (PF) 100 MCG/2ML IJ SOLN
INTRAMUSCULAR | Status: AC
  Filled 2018-10-28: qty 2

## 2018-10-28 MED ORDER — LABETALOL HCL 200 MG PO TABS
200.0000 mg | ORAL_TABLET | Freq: Two times a day (BID) | ORAL | Status: DC
  Administered 2018-10-28 – 2018-10-30 (×5): 200 mg via ORAL
  Filled 2018-10-28 (×6): qty 1

## 2018-10-28 MED ORDER — CEFAZOLIN SODIUM-DEXTROSE 1-4 GM/50ML-% IV SOLN
1.0000 g | Freq: Once | INTRAVENOUS | Status: AC
  Administered 2018-10-28: 1 g via INTRAVENOUS
  Filled 2018-10-28: qty 50

## 2018-10-28 MED ORDER — POVIDONE-IODINE 10 % EX SWAB: 2.0000 "application " | Freq: Once | CUTANEOUS | Status: DC

## 2018-10-28 MED ORDER — SODIUM BICARBONATE 650 MG PO TABS
1300.0000 mg | ORAL_TABLET | Freq: Two times a day (BID) | ORAL | Status: DC
  Administered 2018-10-28 – 2018-10-30 (×4): 1300 mg via ORAL
  Filled 2018-10-28 (×4): qty 2

## 2018-10-28 MED ORDER — FENTANYL CITRATE (PF) 250 MCG/5ML IJ SOLN
INTRAMUSCULAR | Status: AC
  Filled 2018-10-28: qty 5

## 2018-10-28 MED ORDER — PHENYLEPHRINE 40 MCG/ML (10ML) SYRINGE FOR IV PUSH (FOR BLOOD PRESSURE SUPPORT)
PREFILLED_SYRINGE | INTRAVENOUS | Status: DC | PRN
  Administered 2018-10-28 (×3): 120 ug via INTRAVENOUS

## 2018-10-28 MED ORDER — RILPIVIRINE HCL 25 MG PO TABS
25.0000 mg | ORAL_TABLET | Freq: Every day | ORAL | Status: DC
  Administered 2018-10-29 – 2018-10-30 (×2): 25 mg via ORAL
  Filled 2018-10-28 (×3): qty 1

## 2018-10-28 MED ORDER — DOCUSATE SODIUM 100 MG PO CAPS
100.0000 mg | ORAL_CAPSULE | Freq: Two times a day (BID) | ORAL | Status: DC
  Administered 2018-10-28 – 2018-10-30 (×4): 100 mg via ORAL
  Filled 2018-10-28 (×4): qty 1

## 2018-10-28 MED ORDER — OXYCODONE HCL 5 MG PO TABS
5.0000 mg | ORAL_TABLET | ORAL | Status: DC | PRN
  Administered 2018-10-28 – 2018-10-30 (×5): 10 mg via ORAL
  Filled 2018-10-28 (×6): qty 2

## 2018-10-28 MED ORDER — ACETAMINOPHEN 325 MG PO TABS: 325.0000 mg | ORAL_TABLET | Freq: Four times a day (QID) | ORAL | Status: DC | PRN

## 2018-10-28 MED ORDER — METOCLOPRAMIDE HCL 5 MG/ML IJ SOLN: 5.0000 mg | Freq: Three times a day (TID) | INTRAMUSCULAR | Status: DC | PRN

## 2018-10-28 MED ORDER — SUCCINYLCHOLINE CHLORIDE 200 MG/10ML IV SOSY
PREFILLED_SYRINGE | INTRAVENOUS | Status: AC
  Filled 2018-10-28: qty 40

## 2018-10-28 MED ORDER — ONDANSETRON HCL 4 MG PO TABS: 4.0000 mg | ORAL_TABLET | Freq: Every day | ORAL | Status: DC | PRN

## 2018-10-28 MED ORDER — HEPARIN SODIUM (PORCINE) 5000 UNIT/ML IJ SOLN: 5000.0000 [IU] | Freq: Three times a day (TID) | INTRAMUSCULAR | Status: DC

## 2018-10-28 MED ORDER — GABAPENTIN 300 MG PO CAPS
300.0000 mg | ORAL_CAPSULE | Freq: Every day | ORAL | Status: DC
  Administered 2018-10-29 – 2018-10-30 (×2): 300 mg via ORAL
  Filled 2018-10-28 (×2): qty 1

## 2018-10-28 MED ORDER — FENTANYL CITRATE (PF) 250 MCG/5ML IJ SOLN
INTRAMUSCULAR | Status: DC | PRN
  Administered 2018-10-28: 50 ug via INTRAVENOUS

## 2018-10-28 MED ORDER — MAGNESIUM CITRATE PO SOLN: 1.0000 | Freq: Once | ORAL | Status: DC | PRN

## 2018-10-28 MED ORDER — SENNOSIDES-DOCUSATE SODIUM 8.6-50 MG PO TABS: 1.0000 | ORAL_TABLET | Freq: Every evening | ORAL | Status: DC | PRN

## 2018-10-28 MED ORDER — HYDROMORPHONE HCL 1 MG/ML IJ SOLN
0.5000 mg | INTRAMUSCULAR | Status: DC | PRN
  Administered 2018-10-28 – 2018-10-29 (×4): 1 mg via INTRAVENOUS
  Filled 2018-10-28 (×4): qty 1

## 2018-10-28 MED ORDER — PROPOFOL 10 MG/ML IV BOLUS
INTRAVENOUS | Status: DC | PRN
  Administered 2018-10-28: 150 mg via INTRAVENOUS

## 2018-10-28 MED ORDER — SODIUM CHLORIDE 0.9 % IV SOLN: INTRAVENOUS | Status: DC

## 2018-10-28 MED ORDER — FENTANYL CITRATE (PF) 100 MCG/2ML IJ SOLN: 25.0000 ug | INTRAMUSCULAR | Status: DC | PRN

## 2018-10-28 MED ORDER — OXYCODONE HCL 5 MG PO TABS
10.0000 mg | ORAL_TABLET | ORAL | Status: DC | PRN
  Administered 2018-10-28: 15 mg via ORAL
  Filled 2018-10-28: qty 3

## 2018-10-28 MED ORDER — CEFAZOLIN SODIUM-DEXTROSE 2-4 GM/100ML-% IV SOLN: 2.0000 g | INTRAVENOUS | Status: DC

## 2018-10-28 MED ORDER — CALCITRIOL 0.25 MCG PO CAPS
0.2500 ug | ORAL_CAPSULE | Freq: Every day | ORAL | Status: DC
  Administered 2018-10-29 – 2018-10-30 (×2): 0.25 ug via ORAL
  Filled 2018-10-28 (×2): qty 1

## 2018-10-28 MED ORDER — SODIUM POLYSTYRENE SULFONATE 15 GM/60ML PO SUSP
15.0000 g | Freq: Every day | ORAL | Status: DC
  Administered 2018-10-29 – 2018-10-30 (×2): 15 g via ORAL
  Filled 2018-10-28 (×3): qty 60

## 2018-10-28 MED ORDER — ONDANSETRON HCL 4 MG PO TABS: 4.0000 mg | ORAL_TABLET | Freq: Four times a day (QID) | ORAL | Status: DC | PRN

## 2018-10-28 MED ORDER — SODIUM CHLORIDE 0.9% FLUSH: 3.0000 mL | INTRAVENOUS | Status: DC | PRN

## 2018-10-28 MED ORDER — HYDROMORPHONE HCL 1 MG/ML IJ SOLN
1.0000 mg | INTRAMUSCULAR | Status: DC | PRN
  Administered 2018-10-28: 1 mg via INTRAVENOUS
  Filled 2018-10-28: qty 1

## 2018-10-28 MED ORDER — METOCLOPRAMIDE HCL 5 MG PO TABS: 5.0000 mg | ORAL_TABLET | Freq: Three times a day (TID) | ORAL | Status: DC | PRN

## 2018-10-28 MED ORDER — MIDAZOLAM HCL 2 MG/2ML IJ SOLN
INTRAMUSCULAR | Status: AC
  Filled 2018-10-28: qty 2

## 2018-10-28 MED ORDER — ONDANSETRON HCL 4 MG/2ML IJ SOLN
INTRAMUSCULAR | Status: DC | PRN
  Administered 2018-10-28: 4 mg via INTRAVENOUS

## 2018-10-28 MED ORDER — SODIUM CHLORIDE 0.9 % IV SOLN
INTRAVENOUS | Status: DC
  Administered 2018-10-28 (×3): via INTRAVENOUS

## 2018-10-28 MED ORDER — JUVEN PO PACK
1.0000 | PACK | Freq: Two times a day (BID) | ORAL | Status: DC
  Administered 2018-10-29 – 2018-10-30 (×2): 1 via ORAL
  Filled 2018-10-28 (×6): qty 1

## 2018-10-28 MED ORDER — CALCIUM ACETATE (PHOS BINDER) 667 MG PO CAPS
1334.0000 mg | ORAL_CAPSULE | Freq: Three times a day (TID) | ORAL | Status: DC
  Administered 2018-10-29 – 2018-10-30 (×4): 1334 mg via ORAL
  Filled 2018-10-28 (×4): qty 2

## 2018-10-28 SURGICAL SUPPLY — 36 items
BLADE SAW RECIP 87.9 MT (BLADE) ×2 IMPLANT
BLADE SURG 21 STRL SS (BLADE) ×2 IMPLANT
BNDG COHESIVE 6X5 TAN STRL LF (GAUZE/BANDAGES/DRESSINGS) ×2 IMPLANT
CANISTER WOUND CARE 500ML ATS (WOUND CARE) ×2 IMPLANT
COVER SURGICAL LIGHT HANDLE (MISCELLANEOUS) ×2 IMPLANT
COVER WAND RF STERILE (DRAPES) ×2 IMPLANT
CUFF TOURNIQUET SINGLE 34IN LL (TOURNIQUET CUFF) ×2 IMPLANT
DRAPE INCISE IOBAN 66X45 STRL (DRAPES) IMPLANT
DRAPE U-SHAPE 47X51 STRL (DRAPES) ×2 IMPLANT
DRESSING PREVENA PLUS CUSTOM (GAUZE/BANDAGES/DRESSINGS) ×1 IMPLANT
DRSG PREVENA PLUS CUSTOM (GAUZE/BANDAGES/DRESSINGS) ×2
DURAPREP 26ML APPLICATOR (WOUND CARE) ×2 IMPLANT
ELECT REM PT RETURN 9FT ADLT (ELECTROSURGICAL) ×2
ELECTRODE REM PT RTRN 9FT ADLT (ELECTROSURGICAL) ×1 IMPLANT
GLOVE BIOGEL PI IND STRL 9 (GLOVE) ×1 IMPLANT
GLOVE BIOGEL PI INDICATOR 9 (GLOVE) ×1
GLOVE SURG ORTHO 9.0 STRL STRW (GLOVE) ×2 IMPLANT
GOWN STRL REUS W/ TWL XL LVL3 (GOWN DISPOSABLE) ×2 IMPLANT
GOWN STRL REUS W/TWL XL LVL3 (GOWN DISPOSABLE) ×4
KIT BASIN OR (CUSTOM PROCEDURE TRAY) ×2 IMPLANT
KIT TURNOVER KIT B (KITS) ×2 IMPLANT
MANIFOLD NEPTUNE II (INSTRUMENTS) ×2 IMPLANT
NS IRRIG 1000ML POUR BTL (IV SOLUTION) ×2 IMPLANT
PACK ORTHO EXTREMITY (CUSTOM PROCEDURE TRAY) ×2 IMPLANT
PAD ARMBOARD 7.5X6 YLW CONV (MISCELLANEOUS) ×2 IMPLANT
PREVENA RESTOR ARTHOFORM 46X30 (CANNISTER) ×2 IMPLANT
SPONGE LAP 18X18 RF (DISPOSABLE) ×2 IMPLANT
STAPLER VISISTAT 35W (STAPLE) IMPLANT
STOCKINETTE IMPERVIOUS LG (DRAPES) ×2 IMPLANT
SUT ETHILON 2 0 PSLX (SUTURE) IMPLANT
SUT SILK 2 0 (SUTURE) ×2
SUT SILK 2-0 18XBRD TIE 12 (SUTURE) ×1 IMPLANT
SUT VIC AB 1 CTX 27 (SUTURE) ×4 IMPLANT
TOWEL OR 17X26 10 PK STRL BLUE (TOWEL DISPOSABLE) ×2 IMPLANT
TUBE CONNECTING 12X1/4 (SUCTIONS) ×2 IMPLANT
YANKAUER SUCT BULB TIP NO VENT (SUCTIONS) ×2 IMPLANT

## 2018-10-28 NOTE — Progress Notes (Signed)
   Subjective: Kathy Frank is feeling well this morning. No pain at amputation site, but has noticed the wound vac filled with blood since falling. Dr. Sharol Given plans to take her for revision today. Her vaginal bleeding has stopped. She would like to speak with ID about her HIV as she hasn't had a clinic appointment since November.  Objective:  Vital signs in last 24 hours: Vitals:   10/27/18 2245 10/28/18 0030 10/28/18 0300 10/28/18 0403  BP: (!) 132/94 (!) 144/84 140/84 (!) 159/97  Pulse: 96 93 90 96  Resp:    16  Temp:    98.4 F (36.9 C)  TempSrc:    Oral  SpO2: 99% 98% 97% 100%  Weight:      Height:       General: awake, alert, lying comfortably in bed in NAD CV: RRR; systolic murmur Pulm: normal work of breathing on room air; lungs CTAB Ext: L BKA site is bandaged, clean and dry. No RLE edema.  Assessment/Plan:  Active Problems:   Wound dehiscence s/p L transmetatarsal amputation  Kathy Frank is a 43 year old female with CKD stage V, HIV, type 2 diabetes, hypertension, GERD, s/p left BKA on 2/20 and revision after dehiscence on 3/11 who presented after a mechanical fall at home and subsequent pain and bleeding at her BKA site.  Wound dehiscence of left BKA - Hb 7.3. Unable to transfuse unless Hb < 7 due to blood bank shortage.  Plan - Ortho following, appreciate recs. Undergoing revision today with Dr. Sharol Given. - Trend CBC. Transfusion goal Hb > 7.  CKD stage V - Patient declined catheter placement and HD initiation at last admission. She's waiting for her AVF to mature before beginning HD. - Cr improved from 7.3 one week ago to 5.8 - Normokalemic. Pt has been taking kayexalate at home. Plan - Trend BMP - Continue kayexalate 15g daily  Hypertension - BP in the 222L-798X systolic Plan - Continue home labetalol 200 mg twice daily  Type 2 diabetes - Home meds include Lantus 5 units at bedtime and Victoza 1.2 mg daily. Blood sugars within goal. Plan - SSI  HIV -  Continue home Dolutegravir-Rilpivirine - Consult ID per patient request  Dispo: Anticipated discharge in approximately 1-2 days.  Kathy Frank, Kathy Elk, MD 10/28/2018, 6:36 AM Pager: [phone]

## 2018-10-28 NOTE — Consult Note (Signed)
[facility] Nurse wound consult note [facility] consulted simultaneously with Orthopedics and Dr. Sharol Given has seen this morning. POC is to take patient back to surgery for a revision of the transtibial amputation site due to trauma sustained during fall. Noted is the decreased albumin as a potential risk for wound repair and regeneration and there is no recent hemoglobin A1C to review. There is no current role for WOC nursing.  [facility] nursing team will not follow, but will remain available to this patient, the nursing and medical teams.  Please re-consult if needed. Thanks, Maudie Flakes, MSN, RN, [place], Arther Abbott  Pager# [phone]

## 2018-10-28 NOTE — Anesthesia Preprocedure Evaluation (Signed)
Anesthesia Evaluation  Patient identified by MRN, date of birth, ID band Patient awake    Reviewed: Allergy & Precautions, NPO status , Patient's Chart, lab work & pertinent test results  Airway Mallampati: II  TM Distance: >3 FB Neck ROM: Full    Dental no notable dental hx. (+) Poor Dentition,    Pulmonary neg pulmonary ROS, former smoker,    Pulmonary exam normal breath sounds clear to auscultation       Cardiovascular hypertension, Pt. on medications negative cardio ROS Normal cardiovascular exam Rhythm:Regular Rate:Normal     Neuro/Psych negative neurological ROS  negative psych ROS   GI/Hepatic PUD, GERD  Controlled,  Endo/Other  negative endocrine ROSdiabetes, Type 2, Insulin Dependent  Renal/GU Renal InsufficiencyRenal disease  negative genitourinary   Musculoskeletal negative musculoskeletal ROS (+)   Abdominal   Peds  Hematology  (+) Blood dyscrasia (Hgb 7.3), anemia , HIV,   Anesthesia Other Findings   Reproductive/Obstetrics negative OB ROS                             Anesthesia Physical Anesthesia Plan  ASA: III  Anesthesia Plan: General   Post-op Pain Management:    Induction: Intravenous  PONV Risk Score and Plan: 3 and Ondansetron, Dexamethasone and Midazolam  Airway Management Planned: Oral ETT  Additional Equipment:   Intra-op Plan:   Post-operative Plan: Extubation in OR  Informed Consent: I have reviewed the patients History and Physical, chart, labs and discussed the procedure including the risks, benefits and alternatives for the proposed anesthesia with the patient or authorized representative who has indicated his/her understanding and acceptance.     Dental advisory given  Plan Discussed with: CRNA  Anesthesia Plan Comments:         Anesthesia Quick Evaluation

## 2018-10-28 NOTE — Op Note (Signed)
10/27/2018 - 10/28/2018  1:11 PM  PATIENT:  Kathy Frank    PRE-OPERATIVE DIAGNOSIS:  WOUND DEHISCENCE  POST-OPERATIVE DIAGNOSIS:  Same  PROCEDURE:  REVISION BELOW KNEE AMPUTATION Application of Praveena wound VAC  SURGEON:  [name], MD  PHYSICIAN ASSISTANT:None ANESTHESIA:   General  PREOPERATIVE INDICATIONS:  [name] is a  43 y.o. female with a diagnosis of WOUND DEHISCENCE who failed conservative measures and elected for surgical management.    The risks benefits and alternatives were discussed with the patient preoperatively including but not limited to the risks of infection, bleeding, nerve injury, cardiopulmonary complications, the need for revision surgery, among others, and the patient was willing to proceed.  OPERATIVE IMPLANTS: Praveena restore and customizable wound VAC sponges  @ENCIMAGES @  OPERATIVE FINDINGS: Large hematoma with ischemic muscle changes, no abscess.  OPERATIVE PROCEDURE: Patient brought the operating room underwent a general anesthetic.  After adequate levels anesthesia were obtained patient's left lower extremity was prepped using DuraPrep draped into a sterile field a timeout was called.  A fishmouth incision was made around the dehisced tissue.  This carried down to bone the distal 2 cm of tibia and fibula were resected.  The vascular bundles were suture ligated with 2-0 silk.  There was a large hematoma and a large amount of ischemic muscle tissue secondary to the pressure from the hematoma.  The hematoma and ischemic muscle was resected the tibia was beveled anteriorly.  The wound was irrigated with normal saline and hemostasis was completed with electrocautery.  The deep and superficial fascia layers was closed using 2-0 nylon.  The wound was covered with the Praveena restore and customizable VAC.  This had a good suction fit patient was extubated taken the PACU in stable condition.   DISCHARGE PLANNING:  Antibiotic duration: Second  dose of antibiotics after dialysis  Weightbearing: Nonweightbearing on the left  Pain medication: Opioid pathway ordered  Dressing care/ Wound VAC: Continue wound VAC for 1 week postoperatively.  Ambulatory devices: Walker  Discharge to: Reevaluate for discharge to home patient was unstable at home and that was the cause of her fall.  Patient may need skilled nursing placement.  Follow-up: In the office 1 week post operative.

## 2018-10-28 NOTE — Progress Notes (Signed)
Per blood bank Pt Hg doesn't meet the criteria for transfusion. Hg has to be <7. MD notified. Patient currently in [place].

## 2018-10-28 NOTE — Consult Note (Signed)
[facility] for Infectious Disease    Date of Admission:  10/27/2018           Reason for Consult: HIV Check In      Referring Provider: Dareen Piano  Primary Care Provider: Lars Mage, MD   Assessment: Kathy Frank is just back to her room following second revision of her left BKA stump associated with with a second fall with ensuing trauma and disruption to the incision.  I informed her today that we checked a HIV viral load for her and it shows that she is undetectable.  She was very pleased to hear this as she has been worried about her medications which since November.  We will check a CD4 count in the morning with next lab draw.  I told her today that she can follow-up with our clinic in July over the summer.   She was very appreciative of my visit today.  We also need to get her set up for routine Pap smear for cervical cancer screening once her other acute medical needs resolve.  She has a follow-up appointment scheduled for June 29 at 9 AM with Dr. Megan Salon.  Please continue her dolutegravir and rilpivirine once daily with full meal with conversion back to single tablet Juluca at discharge.  Plan: 1. CD4 count with next lab draw 2. Continue dolutegravir and rilpivirine once daily with meal 3. Single tablet Juluca at discharge.  Principal Problem:   Wound dehiscence Active Problems:   Human immunodeficiency virus (HIV) disease ([place])   Fall due to stumbling   Below-knee amputation of left lower extremity ([place])   . sodium chloride   Intravenous Once  . calcitRIOL  0.25 mcg Oral Daily  . calcium acetate  1,334 mg Oral TID WC  . citalopram  20 mg Oral Daily  . docusate sodium  100 mg Oral BID  . dolutegravir  50 mg Oral Q breakfast   And  . rilpivirine  25 mg Oral Q breakfast  . fentaNYL      . gabapentin  300 mg Oral Daily  . insulin aspart  0-15 Units Subcutaneous Q4H  . labetalol  200 mg Oral BID  . megestrol  40 mg Oral Daily  . nutrition supplement (JUVEN)   1 packet Oral BID BM  . sodium bicarbonate  1,300 mg Oral BID  . sodium chloride flush  3 mL Intravenous Q12H  . sodium polystyrene  15 g Oral Daily    HPI: Kathy Frank is a 43 y.o. female with known and well-controlled HIV here with postoperative wound dehiscence.  She also has diabetes with last hemoglobin A1c in November indicating good control at 6.5%.  She has had several hospital stays following falls after her left below-knee amputation in February 2020.  She has required surgical revision of the left stump 3/11 and has now unfortunately required a second revision done earlier today with Dr. Sharol Given.  She has also had an AV fistula created in the left arm with Dr. Carlis Abbott October 14, 2018.   During hospitalization back in November 2019 we switched her to [place] once daily to control her HIV disease.  There was concerned that her kidney function was worsening due to possible tenofovir.  She has not had routine follow-up due to multiple other medical comorbidities and surgical needs.  She is concerned that she has not touch base with our team in a while and we were asked to come see her.  She has continued to take her Juluca once daily with a full chewable meal.  She estimates no missed doses and reports no concerns for side effects. Reports no complaints today suggestive of associated opportunistic infection or advancing HIV disease such as fevers, night sweats, weight loss, anorexia, cough, SOB, nausea, vomiting, diarrhea, headache, sensory changes, lymphadenopathy or oral thrush.    Review of Systems: Review of Systems  Constitutional: Negative for chills and fever.  Respiratory: Negative for cough and sputum production.   Gastrointestinal: Negative for abdominal pain, diarrhea and vomiting.  Musculoskeletal: Positive for joint pain.  Skin: Negative for rash.  Neurological: Negative for dizziness, tingling and headaches.  Psychiatric/Behavioral: Negative for depression and substance abuse.     Past Medical History:  Diagnosis Date  . CKD (chronic kidney disease) stage 5, GFR less than 15 ml/min (HCC) 06/19/2016   06/30/16: Seen by Dr. Posey Pronto [Nephrology]. Suspect etiologies to include diabetes and hypertension though cannot exclude HIV-associated nephropathy. Advised her against from using excessive Goody powder.  . Contact dermatitis 04/21/2016   07/29/16: Skin biopsy performed by [facility] for acute spongiotic dermatitis consistent with contact dermatitis.  . Cyclical vomiting    THC induced???  . Depression 06/28/2006   Qualifier: Diagnosis of  By: Riccardo Dubin MD, Todd    . Diabetes mellitus type 2 in obese ([place]) 06/28/1994  . DKA (diabetic ketoacidoses) ([place]) 12/2014  . Erosive esophagitis   . Esophageal reflux   . Essential hypertension 11/16/2013  . Gastroparesis    ? diabetic  . GERD (gastroesophageal reflux disease)   . Human immunodeficiency virus (HIV) disease ([place]) 04/23/2016  . Hypertension   . Moderate nonproliferative diabetic retinopathy of both eyes ([place]) 11/21/2014   11/14/14: Noted on retinal imaging; needs follow-up imaging in 6 months  05/22/16: Noted on retinal imaging again; needs follow-up imaging in 6 months  . Normocytic anemia   . TOBACCO USER 06/28/2006   Qualifier: Diagnosis of  By: Riccardo Dubin MD, Sherren Mocha    . Type II diabetes mellitus ([place]) 1994   diagnosed around 24    Social History   Tobacco Use  . Smoking status: Former Smoker    Packs/day: 0.10    Years: 10.00    Pack years: 1.00    Types: Cigarettes    Start date: 11/17/2013    Last attempt to quit: 09/09/2014    Years since quitting: 4.1  . Smokeless tobacco: Never Used  Substance Use Topics  . Alcohol use: Yes    Comment: Occasionally.  . Drug use: Yes    Frequency: 4.0 times per week    Types: Marijuana    Family History  Problem Relation Age of Onset  . Diabetes Mother   . Diabetes Brother   . Diabetes Daughter   . Diabetes Daughter   . Mental  retardation Brother        died from PNA  . Diabetes Maternal Grandmother    No Known Allergies  OBJECTIVE: Blood pressure 128/86, pulse 78, temperature 97.7 F (36.5 C), resp. rate 16, height 5\' 5"  (1.651 m), weight 74.8 kg, SpO2 95 %.  Physical Exam Constitutional:      Comments: She is resting comfortably in bed.  Appears well-nourished and well-developed.  HENT:     Mouth/Throat:     Mouth: No oral lesions.     Dentition: No dental abscesses.  Cardiovascular:     Rate and Rhythm: Normal rate and regular rhythm.     Heart sounds: Normal  heart sounds.  Pulmonary:     Effort: Pulmonary effort is normal.     Breath sounds: Normal breath sounds.  Abdominal:     General: There is no distension.     Palpations: Abdomen is soft.     Tenderness: There is no abdominal tenderness.  Musculoskeletal: Normal range of motion.        General: No tenderness.     Comments: Her left leg is bandaged.  No drainage noted on the dressing.  Lymphadenopathy:     Cervical: No cervical adenopathy.  Skin:    General: Skin is warm and dry.     Capillary Refill: Capillary refill takes less than 2 seconds.     Findings: No rash.  Neurological:     Mental Status: She is alert and oriented to person, place, and time.  Psychiatric:        Judgment: Judgment normal.     Lab Results Lab Results  Component Value Date   WBC 10.1 10/28/2018   HGB 7.3 (L) 10/28/2018   HCT 23.6 (L) 10/28/2018   MCV 94.4 10/28/2018   PLT 384 10/28/2018    Lab Results  Component Value Date   CREATININE 5.89 (H) 10/28/2018   BUN 40 (H) 10/28/2018   NA 140 10/28/2018   K 4.5 10/28/2018   CL 110 10/28/2018   CO2 20 (L) 10/28/2018    Lab Results  Component Value Date   ALT 6 10/16/2018   AST 15 10/16/2018   ALKPHOS 79 10/16/2018   BILITOT 0.5 10/16/2018     Microbiology: No results found for this or any previous visit (from the past 240 hour(s)).  Janene Madeira, MSN, NP-C [facility] for  Infectious [place] Cell: [phone] Pager: [phone]  10/28/2018 3:03 PM

## 2018-10-28 NOTE — ED Notes (Signed)
ED TO INPATIENT HANDOFF REPORT  ED Nurse Name and Phone #:  Kel[place] Fam [phone]  S Name/Age/Gender Kathy Frank 43 y.o. female Room/Bed: 017C/017C  Code Status   Code Status: Full Code  Home/SNF/Other Home Patient oriented to: self, place, time and situation Is this baseline? Yes   Triage Complete: Triage complete  Chief Complaint fall  Triage Note Pt went to take something out of the freezer and she felt off balance and fell. Pt recently had a left BKA on feb 29th. Pt hit left leg on floor causing wound vac to come off of leg.   Allergies No Known Allergies  Level of Care/Admitting Diagnosis ED Disposition    ED Disposition Condition [facility]: [facility] [[id number]]  Level of Care: Med-Surg [16]  Diagnosis: Wound dehiscence [369010]  Admitting Physician: Aldine Contes [[id number]]  Attending Physician: Aldine Contes [phone]  PT Class (Do Not Modify): Observation [104]  PT Acc Code (Do Not Modify): Observation [10022]       B Medical/Surgery History Past Medical History:  Diagnosis Date  . CKD (chronic kidney disease) stage 5, GFR less than 15 ml/min (HCC) 06/19/2016   06/30/16: Seen by Dr. Posey Pronto [Nephrology]. Suspect etiologies to include diabetes and hypertension though cannot exclude HIV-associated nephropathy. Advised her against from using excessive Goody powder.  . Contact dermatitis 04/21/2016   07/29/16: Skin biopsy performed by [facility] for acute spongiotic dermatitis consistent with contact dermatitis.  . Cyclical vomiting    THC induced???  . Depression 06/28/2006   Qualifier: Diagnosis of  By: Riccardo Dubin MD, Todd    . Diabetes mellitus type 2 in obese ([place]) 06/28/1994  . DKA (diabetic ketoacidoses) ([place]) 12/2014  . Erosive esophagitis   . Esophageal reflux   . Essential hypertension 11/16/2013  . Gastroparesis    ? diabetic  . GERD (gastroesophageal reflux disease)   . Human  immunodeficiency virus (HIV) disease ([place]) 04/23/2016  . Hypertension   . Moderate nonproliferative diabetic retinopathy of both eyes ([place]) 11/21/2014   11/14/14: Noted on retinal imaging; needs follow-up imaging in 6 months  05/22/16: Noted on retinal imaging again; needs follow-up imaging in 6 months  . Normocytic anemia   . TOBACCO USER 06/28/2006   Qualifier: Diagnosis of  By: Riccardo Dubin MD, Sherren Mocha    . Type II diabetes mellitus ([place]) 1994   diagnosed around 1994   Past Surgical History:  Procedure Laterality Date  . AMPUTATION Left 07/08/2018   Procedure: AMPUTATION FORTH RAY LEFT FOOT;  Surgeon: [name], MD;  Location: [facility];  Service: Orthopedics;  Laterality: Left;  . AMPUTATION Left 08/09/2018   Procedure: Left Transmetatarsal Amputation;  Surgeon: [name], MD;  Location: [facility];  Service: Orthopedics;  Laterality: Left;  . AMPUTATION Left 10/08/2018   Procedure: LEFT BELOW KNEE AMPUTATION;  Surgeon: [name], MD;  Location: [facility];  Service: Orthopedics;  Laterality: Left;  . AV FISTULA PLACEMENT Left 10/14/2018   Procedure: Arteriovenous (Av) Fistula Creation Left Arm;  Surgeon: [name], MD;  Location: [facility];  Service: Vascular;  Laterality: Left;  . LOWER EXTREMITY ANGIOGRAPHY N/A 07/05/2018   Procedure: LOWER EXTREMITY ANGIOGRAPHY;  Surgeon: Serafina Mitchell, MD;  Location: [facility];  Service: Cardiovascular;  Laterality: N/A;  . PERIPHERAL VASCULAR BALLOON ANGIOPLASTY Left 07/05/2018   Procedure: PERIPHERAL VASCULAR BALLOON ANGIOPLASTY;  Surgeon: Serafina Mitchell, MD;  Location: [facility];  Service:  Cardiovascular;  Laterality: Left;  SFA  . STUMP REVISION Left 10/19/2018   Procedure: REVISION LEFT BELOW KNEE AMPUTATION;  Surgeon: [name], MD;  Location: [facility];  Service: Orthopedics;  Laterality: Left;  . TUBAL LIGATION  2002     A IV Location/Drains/Wounds Patient Lines/Drains/Airways Status   Active Line/Drains/Airways     Name:   Placement date:   Placement time:   Site:   Days:   Fistula / Graft Left Forearm Arteriovenous fistula   10/14/18    0843    Forearm   14   Incision (Closed) 08/09/18 Foot Left   08/09/18    1138     80   Incision (Closed) 10/14/18 Arm Left   10/14/18    0845     14   Incision (Closed) 09/07/18 Leg Left   09/07/18    2000     51   Incision (Closed) 10/19/18 Leg Left   10/19/18    1407     9   Wound / Incision (Open or Dehisced) 10/16/18 Incision - Dehisced Leg Left BKA   10/16/18    2200    Leg   12          Intake/Output Last 24 hours No intake or output data in the 24 hours ending 10/28/18 5277  Labs/Imaging Results for orders placed or performed during the hospital encounter of 10/27/18 (from the past 48 hour(s))  CBC with Differential     Status: Abnormal   Collection Time: 10/27/18 11:39 PM  Result Value Ref Range   WBC 12.0 (H) 4.0 - 10.5 K/uL   RBC 2.61 (L) 3.87 - 5.11 MIL/uL   Hemoglobin 7.6 (L) 12.0 - 15.0 g/dL   HCT 25.2 (L) 36.0 - 46.0 %   MCV 96.6 80.0 - 100.0 fL   MCH 29.1 26.0 - 34.0 pg   MCHC 30.2 30.0 - 36.0 g/dL   RDW 14.7 11.5 - 15.5 %   Platelets 398 150 - 400 K/uL   nRBC 0.0 0.0 - 0.2 %   Neutrophils Relative % 71 %   Neutro Abs 8.6 (H) 1.7 - 7.7 K/uL   Lymphocytes Relative 14 %   Lymphs Abs 1.7 0.7 - 4.0 K/uL   Monocytes Relative 6 %   Monocytes Absolute 0.7 0.1 - 1.0 K/uL   Eosinophils Relative 8 %   Eosinophils Absolute 1.0 (H) 0.0 - 0.5 K/uL   Basophils Relative 1 %   Basophils Absolute 0.1 0.0 - 0.1 K/uL   Immature Granulocytes 0 %   Abs Immature Granulocytes 0.04 0.00 - 0.07 K/uL    Comment: Performed at [facility], [address] [place], [place] [address]  Basic metabolic panel     Status: Abnormal   Collection Time: 10/27/18 11:39 PM  Result Value Ref Range   Sodium 138 135 - 145 mmol/L   Potassium 5.0 3.5 - 5.1 mmol/L   Chloride 105 98 - 111 mmol/L   CO2 19 (L) 22 - 32 mmol/L   Glucose, Bld 136 (H) 70 - 99 mg/dL   BUN  40 (H) 6 - 20 mg/dL   Creatinine, Ser 5.78 (H) 0.44 - 1.00 mg/dL   Calcium 8.6 (L) 8.9 - 10.3 mg/dL   GFR calc non Af Amer 8 (L) >60 mL/min   GFR calc Af Amer 10 (L) >60 mL/min   Anion gap 14 5 - 15    Comment: Performed at [facility], [place]  [address], [place], [place] [address]  Type and screen     Status: None   Collection Time: 10/27/18 11:40 PM  Result Value Ref Range   ABO/RH(D) B POS    Antibody Screen NEG    Sample Expiration      10/30/2018 Performed at [facility], [place] [address], [place], [place] [address]    No results found.  Pending Labs Unresulted Labs (From admission, onward)    Start     Ordered   10/28/18 0700  Renal function panel  Once,   R     10/28/18 0202   10/28/18 0700  CBC  Tomorrow morning,   R     10/28/18 0202   10/28/18 0203  CBC  (heparin)  Once,   R    Comments:  Baseline for heparin therapy IF NOT ALREADY DRAWN.  Notify MD if PLT < 100 K.    10/28/18 0202   10/28/18 0203  Creatinine, serum  (heparin)  Once,   R    Comments:  Baseline for heparin therapy IF NOT ALREADY DRAWN.    10/28/18 0202          Vitals/Pain Today's Vitals   10/27/18 2230 10/27/18 2237 10/27/18 2245 10/28/18 0030  BP: (!) 146/74  (!) 132/94 (!) 144/84  Pulse: 96  96 93  Resp:      Temp:      TempSrc:      SpO2: 99%  99% 98%  Weight:      Height:      PainSc:  9       Isolation Precautions No active isolations  Medications Medications  sodium chloride flush (NS) 0.9 % injection 3 mL (has no administration in time range)  sodium chloride flush (NS) 0.9 % injection 3 mL (has no administration in time range)  0.9 %  sodium chloride infusion (has no administration in time range)  acetaminophen (TYLENOL) tablet 650 mg (has no administration in time range)    Or  acetaminophen (TYLENOL) suppository 650 mg (has no administration in time range)  senna-docusate (Senokot-S) tablet 1 tablet (has no administration in time range)   Dolutegravir-Rilpivirine 50-25 MG TABS 1 tablet (has no administration in time range)  megestrol (MEGACE) tablet 40 mg (has no administration in time range)  labetalol (NORMODYNE) tablet 200 mg (has no administration in time range)  citalopram (CELEXA) tablet 20 mg (has no administration in time range)  calcitRIOL (ROCALTROL) capsule 0.25 mcg (has no administration in time range)  calcium acetate (PHOSLO) capsule 1,334 mg (has no administration in time range)  ondansetron (ZOFRAN) tablet 4 mg (has no administration in time range)  polyethylene glycol (MIRALAX / GLYCOLAX) packet 17 g (has no administration in time range)  sodium bicarbonate tablet 1,300 mg (has no administration in time range)  sodium polystyrene (KAYEXALATE) 15 GM/60ML suspension 15 g (has no administration in time range)  gabapentin (NEURONTIN) capsule 300 mg (has no administration in time range)  methocarbamol (ROBAXIN) tablet 500 mg (has no administration in time range)  nutrition supplement (JUVEN) (JUVEN) powder packet 1 packet (has no administration in time range)  HYDROcodone-acetaminophen (NORCO) 7.5-325 MG per tablet 1 tablet (has no administration in time range)  oxyCODONE (Oxy IR/ROXICODONE) immediate release tablet 10 mg (10 mg Oral Given 10/27/18 2329)    Mobility non-ambulatory High fall risk   Focused Assessments Cardiac Assessment Handoff:    Lab Results  Component Value Date   CKTOTAL 64 07/03/2018   No results found  for: DDIMER Does the Patient currently have chest pain? No     R Recommendations: See Admitting Provider Note  Report given to:   Additional Notes: Recent BKA, pt fell and opened incision. Provider took off wound vac and wrapped with gauze and ace bandage.

## 2018-10-28 NOTE — Anesthesia Procedure Notes (Signed)
Procedure Name: Intubation Date/Time: 10/28/2018 12:31 PM Performed by: Bryson Corona, CRNA Pre-anesthesia Checklist: Patient identified, Emergency Drugs available, Suction available and Patient being monitored Patient Re-evaluated:Patient Re-evaluated prior to induction Oxygen Delivery Method: Circle System Utilized Preoxygenation: Pre-oxygenation with 100% oxygen Induction Type: IV induction Ventilation: Mask ventilation without difficulty Laryngoscope Size: Mac and 3 Grade View: Grade I Tube type: Oral Tube size: 7.0 mm Number of attempts: 1 Airway Equipment and Method: Stylet and Oral airway Placement Confirmation: ETT inserted through vocal cords under direct vision,  positive ETCO2 and breath sounds checked- equal and bilateral Secured at: 20 cm Tube secured with: Tape Dental Injury: Teeth and Oropharynx as per pre-operative assessment

## 2018-10-28 NOTE — Consult Note (Signed)
ORTHOPAEDIC CONSULTATION  REQUESTING PHYSICIAN: Aldine Contes, MD  Chief Complaint: Fall on her left transtibial amputation with wound dehiscence.  HPI: Kathy Frank is a 43 y.o. female who presents with dehiscence of her left transtibial amputation.  Patient states that she was standing at the freezer with her walker she grabbed a 2 L bottle of Coke when the weight of the bottle threw off her balance and she fell on the left transtibial amputation.  Patient states the wound VAC filled up with blood.  Past Medical History:  Diagnosis Date  . CKD (chronic kidney disease) stage 5, GFR less than 15 ml/min (HCC) 06/19/2016   06/30/16: Seen by Dr. Posey Pronto [Nephrology]. Suspect etiologies to include diabetes and hypertension though cannot exclude HIV-associated nephropathy. Advised her against from using excessive Goody powder.  . Contact dermatitis 04/21/2016   07/29/16: Skin biopsy performed by [facility] notable for acute spongiotic dermatitis consistent with contact dermatitis.  . Cyclical vomiting    THC induced???  . Depression 06/28/2006   Qualifier: Diagnosis of  By: Riccardo Dubin MD, Todd    . Diabetes mellitus type 2 in obese ([place]) 06/28/1994  . DKA (diabetic ketoacidoses) ([place]) 12/2014  . Erosive esophagitis   . Esophageal reflux   . Essential hypertension 11/16/2013  . Gastroparesis    ? diabetic  . GERD (gastroesophageal reflux disease)   . Human immunodeficiency virus (HIV) disease ([place]) 04/23/2016  . Hypertension   . Moderate nonproliferative diabetic retinopathy of both eyes ([place]) 11/21/2014   11/14/14: Noted on retinal imaging; needs follow-up imaging in 6 months  05/22/16: Noted on retinal imaging again; needs follow-up imaging in 6 months  . Normocytic anemia   . TOBACCO USER 06/28/2006   Qualifier: Diagnosis of  By: Riccardo Dubin MD, Sherren Mocha    . Type II diabetes mellitus ([place]) 1994   diagnosed around 1994   Past Surgical History:  Procedure Laterality Date   . AMPUTATION Left 07/08/2018   Procedure: AMPUTATION FORTH RAY LEFT FOOT;  Surgeon: [name], MD;  Location: [place];  Service: Orthopedics;  Laterality: Left;  . AMPUTATION Left 08/09/2018   Procedure: Left Transmetatarsal Amputation;  Surgeon: [name], MD;  Location: [place];  Service: Orthopedics;  Laterality: Left;  . AMPUTATION Left 10/08/2018   Procedure: LEFT BELOW KNEE AMPUTATION;  Surgeon: [name], MD;  Location: [facility];  Service: Orthopedics;  Laterality: Left;  . AV FISTULA PLACEMENT Left 10/14/2018   Procedure: Arteriovenous (Av) Fistula Creation Left Arm;  Surgeon: [name], MD;  Location: [facility];  Service: Vascular;  Laterality: Left;  . LOWER EXTREMITY ANGIOGRAPHY N/A 07/05/2018   Procedure: LOWER EXTREMITY ANGIOGRAPHY;  Surgeon: Serafina Mitchell, MD;  Location: [facility];  Service: Cardiovascular;  Laterality: N/A;  . PERIPHERAL VASCULAR BALLOON ANGIOPLASTY Left 07/05/2018   Procedure: PERIPHERAL VASCULAR BALLOON ANGIOPLASTY;  Surgeon: Serafina Mitchell, MD;  Location: [facility];  Service: Cardiovascular;  Laterality: Left;  SFA  . STUMP REVISION Left 10/19/2018   Procedure: REVISION LEFT BELOW KNEE AMPUTATION;  Surgeon: [name], MD;  Location: [facility];  Service: Orthopedics;  Laterality: Left;  . TUBAL LIGATION  2002   Social History   Socioeconomic History  . Marital status: Single    Spouse name: Not on file  . Number of children: Not on file  . Years of education: 26  . Highest education level: Not on file  Occupational History    Employer:  UNEMPLOYED  Social Needs  . Financial resource strain: Not on file  . Food insecurity:    Worry: Not on file    Inability: Not on file  . Transportation needs:    Medical: Not on file    Non-medical: Not on file  Tobacco Use  . Smoking status: Former Smoker    Packs/day: 0.10    Years: 10.00    Pack years: 1.00    Types: Cigarettes    Start date: 11/17/2013    Last attempt to  quit: 09/09/2014    Years since quitting: 4.1  . Smokeless tobacco: Never Used  Substance and Sexual Activity  . Alcohol use: Yes    Comment: Occasionally.  . Drug use: Yes    Frequency: 4.0 times per week    Types: Marijuana  . Sexual activity: Not on file  Lifestyle  . Physical activity:    Days per week: Not on file    Minutes per session: Not on file  . Stress: Not on file  Relationships  . Social connections:    Talks on phone: Not on file    Gets together: Not on file    Attends religious service: Not on file    Active member of club or organization: Not on file    Attends meetings of clubs or organizations: Not on file    Relationship status: Not on file  Other Topics Concern  . Not on file  Social History Narrative  . Not on file   Family History  Problem Relation Age of Onset  . Diabetes Mother   . Diabetes Brother   . Diabetes Daughter   . Diabetes Daughter   . Mental retardation Brother        died from PNA  . Diabetes Maternal Grandmother    - negative except otherwise stated in the family history section No Known Allergies Prior to Admission medications   Medication Sig Start Date End Date Taking? Authorizing Provider  amLODipine (NORVASC) 10 MG tablet Take 1 tablet (10 mg total) by mouth daily. 12/24/17 10/27/27 Yes Rice, Resa Miner, MD  calcitRIOL (ROCALTROL) 0.25 MCG capsule Take 1 capsule (0.25 mcg total) by mouth daily. 10/17/18  Yes [name], MD  calcium acetate (PHOSLO) 667 MG capsule Take 2 capsules (1,334 mg total) by mouth 3 (three) times daily with meals. 10/16/18  Yes [name], MD  citalopram (CELEXA) 20 MG tablet Take 1 tablet (20 mg total) by mouth daily. 08/12/18 10/27/27 Yes Carroll Sage, MD  Darbepoetin Alfa (ARANESP) 60 MCG/0.3ML SOSY injection Inject 0.3 mLs (60 mcg total) into the skin every Tuesday at 6 PM. 10/18/18  Yes Harbrecht, Purcell Nails, MD  Dolutegravir-Rilpivirine (JULUCA) 50-25 MG TABS Take 1 tablet by mouth  daily. Please take with largest meal of the day. 07/05/18  Yes Annia Belt, MD  gabapentin (NEURONTIN) 300 MG capsule Take 1 capsule (300 mg total) by mouth daily. 10/17/18  Yes [name], MD  Insulin Glargine (LANTUS) 100 UNIT/ML Solostar Pen Inject 5 Units into the skin at bedtime. 10/16/18  Yes [name], MD  labetalol (NORMODYNE) 200 MG tablet Take 1 tablet (200 mg total) by mouth 2 (two) times daily. 10/21/18  Yes [name], MD  liraglutide (VICTOZA) 18 MG/3ML SOPN Inject 0.2 mLs (1.2 mg total) into the skin daily. 02/04/18  Yes Chundi, Vahini, MD  megestrol (MEGACE) 40 MG tablet Take 1 tablet (40 mg total) by mouth daily. 10/21/18  Yes [name], MD  methocarbamol (ROBAXIN)  500 MG tablet Take 1 tablet (500 mg total) by mouth every 6 (six) hours as needed for muscle spasms. 10/21/18  Yes [name], MD  nutrition supplement, JUVEN, (JUVEN) PACK Take 1 packet by mouth 2 (two) times daily between meals. 10/16/18  Yes [name], MD  ondansetron (ZOFRAN) 4 MG tablet Take 1 tablet (4 mg total) by mouth daily as needed for nausea or vomiting. 08/19/18 08/19/19 Yes Alphonzo Grieve, MD  polyethylene glycol (MIRALAX / GLYCOLAX) packet Take 17 g by mouth daily. Patient taking differently: Take 17 g by mouth daily as needed for mild constipation.  08/12/18  Yes Carroll Sage, MD  senna (SENOKOT) 8.6 MG TABS tablet Take 2 tablets (17.2 mg total) by mouth 2 (two) times daily. Patient taking differently: Take 2 tablets by mouth daily as needed for mild constipation.  08/12/18  Yes Carroll Sage, MD  sodium bicarbonate 650 MG tablet Take 2 tablets (1,300 mg total) by mouth 2 (two) times daily. 10/16/18  Yes [name], MD  sodium polystyrene (KAYEXALATE) 15 GM/60ML suspension Take 60 mLs (15 g total) by mouth daily. 10/21/18  Yes Harbrecht, Purcell Nails, MD  glucose blood (ACCU-CHEK GUIDE) test strip USE TO TEST BLOOD SUGAR 3 TIMES DAILY . Diagnosis code   E11.69, E66.9 07/09/17   Lars Mage, MD   No results found. - pertinent xrays, CT, MRI studies were reviewed and independently interpreted  Positive ROS: All other systems have been reviewed and were otherwise negative with the exception of those mentioned in the HPI and as above.  Physical Exam: General: Alert, no acute distress Psychiatric: Patient is competent for consent with normal mood and affect Lymphatic: No axillary or cervical lymphadenopathy Cardiovascular: No pedal edema Respiratory: No cyanosis, no use of accessory musculature GI: No organomegaly, abdomen is soft and non-tender    Images:  @ENCIMAGES @  Labs:  Lab Results  Component Value Date   HGBA1C 6.5 (H) 08/06/2018   HGBA1C 7.9 (A) 02/04/2018   HGBA1C 9.0 (H) 09/13/2017   ESRSEDRATE 21 08/06/2018   ESRSEDRATE 85 (H) 02/02/2017   CRP <0.8 10/07/2018   CRP <0.8 08/06/2018   CRP 42.4 (H) 02/02/2017   REPTSTATUS 10/12/2018 FINAL 10/07/2018   REPTSTATUS 10/09/2018 FINAL 10/07/2018   GRAMSTAIN  10/07/2018    NO WBC SEEN RARE GRAM POSITIVE COCCI IN PAIRS FEW GRAM POSITIVE RODS    CULT  10/07/2018    NO GROWTH 5 DAYS Performed at [facility], [place] [address], [place], [place] [address]    CULT  10/07/2018    NORMAL SKIN FLORA Performed at [facility], [place] [address], [place], [place] [address]    Neche 07/08/2018    Lab Results  Component Value Date   ALBUMIN 2.5 (L) 10/21/2018   ALBUMIN 2.3 (L) 10/20/2018   ALBUMIN 2.3 (L) 10/19/2018    Neurologic: Patient does not have protective sensation bilateral lower extremities.   MUSCULOSKELETAL:   Skin: Examination patient has dehiscence of the wound with a large blood clot and gaping of the surgical incision.  Patient is alert oriented no adenopathy she has no other complications from her fall no head injury.  Patient's last albumin was 2.5 her last hemoglobin A1c is not current this year.  Assessment:  Assessment: Diabetic insensate neuropathy with peripheral vascular disease with severe protein caloric malnutrition with traumatic dehiscence of the left transtibial amputation.  Plan: Plan: We will plan for revision of the transtibial amputation.  Risks and benefits  were discussed including risk of further surgery.  Patient states he understands wished to proceed at this time patient may require discharge to inpatient versus outpatient rehabilitation.  Thank you for the consult and the opportunity to see Ms. Princess Perna, Kyle [phone] 6:57 AM

## 2018-10-28 NOTE — H&P (Addendum)
Date: 10/28/2018               Patient Name:  Kathy Frank MRN: [id number]  DOB: [birth year] Age / Sex: 43 y.o., female   PCP: Lars Mage, MD         Medical Service: Internal Medicine Teaching Service         Attending Physician: Dr. Aldine Contes, MD    First Contact: Dr. Evern Bio Pager: [phone]  Second Contact: Dr. [name] Pager: [phone]       After Hours (After 5p/  First Contact Pager: [phone]  weekends / holidays): Second Contact Pager: [phone]   Chief Complaint: After hitting her left leg.  History of Present Illness: Ms. 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  She underwent a left BKA on 2/20.  She was then admitted from 3/8 to 3/13 for left BKA wound is since.  She underwent a revision with Ortho on 8/36/6294 without complication.  He was discharged home with outpatient PT.  She was doing well after her last admission until this afternoon when she was trying to get something on the freezer.  She was using her walker but lost balance while reaching for an item.  She landed on her left leg and noticed that the wound VAC was feeling with blood.  He also had severe leg pain and decided to come to the ED.  She denies losing consciousness, hitting her head, chest pain, shortness of breath, palpitations.  In the ED, we slightly hypertensive with SBP's between 130-150.  Otherwise normal vital signs.  Pertinent labs include leukocytosis with WBC of 12, normocytic anemia with hemoglobin of 7.6, elevated creatinine of 5.78 (consistent with known CKD).  She was given oxycodone 10 mg for the pain.  Meds: Current Meds  Medication Sig   amLODipine (NORVASC) 10 MG tablet Take 1 tablet (10 mg total) by mouth daily.   calcitRIOL (ROCALTROL) 0.25 MCG capsule Take 1 capsule (0.25 mcg total) by mouth daily.   calcium acetate (PHOSLO) 667 MG capsule  Take 2 capsules (1,334 mg total) by mouth 3 (three) times daily with meals.   citalopram (CELEXA) 20 MG tablet Take 1 tablet (20 mg total) by mouth daily.   Darbepoetin Alfa (ARANESP) 60 MCG/0.3ML SOSY injection Inject 0.3 mLs (60 mcg total) into the skin every Tuesday at 6 PM.   Dolutegravir-Rilpivirine (JULUCA) 50-25 MG TABS Take 1 tablet by mouth daily. Please take with largest meal of the day.   gabapentin (NEURONTIN) 300 MG capsule Take 1 capsule (300 mg total) by mouth daily.   Insulin Glargine (LANTUS) 100 UNIT/ML Solostar Pen Inject 5 Units into the skin at bedtime.   labetalol (NORMODYNE) 200 MG tablet Take 1 tablet (200 mg total) by mouth 2 (two) times daily.   liraglutide (VICTOZA) 18 MG/3ML SOPN Inject 0.2 mLs (1.2 mg total) into the skin daily.   megestrol (MEGACE) 40 MG tablet Take 1 tablet (40 mg total) by mouth daily.   methocarbamol (ROBAXIN) 500 MG tablet Take 1 tablet (500 mg total) by mouth every 6 (six) hours as needed for muscle spasms.   nutrition supplement, JUVEN, (JUVEN) PACK Take 1 packet by mouth 2 (two) times daily between meals.   ondansetron (ZOFRAN) 4 MG tablet Take 1 tablet (4 mg total) by mouth daily as needed for nausea or vomiting.   polyethylene glycol (  MIRALAX / GLYCOLAX) packet Take 17 g by mouth daily. (Patient taking differently: Take 17 g by mouth daily as needed for mild constipation. )   senna (SENOKOT) 8.6 MG TABS tablet Take 2 tablets (17.2 mg total) by mouth 2 (two) times daily. (Patient taking differently: Take 2 tablets by mouth daily as needed for mild constipation. )   sodium bicarbonate 650 MG tablet Take 2 tablets (1,300 mg total) by mouth 2 (two) times daily.   sodium polystyrene (KAYEXALATE) 15 GM/60ML suspension Take 60 mLs (15 g total) by mouth daily.     Allergies: Allergies as of 10/27/2018   (No Known Allergies)   Past Medical History:  Diagnosis Date   CKD (chronic kidney disease) stage 5, GFR less than 15 ml/min  ([place]) 06/19/2016   06/30/16: Seen by Dr. Posey Pronto [Nephrology]. Suspect etiologies to include diabetes and hypertension though cannot exclude HIV-associated nephropathy. Advised her against from using excessive Goody powder.   Contact dermatitis 04/21/2016   07/29/16: Skin biopsy performed by [facility] notable for acute spongiotic dermatitis consistent with contact dermatitis.   Cyclical vomiting    THC induced???   Depression 06/28/2006   Qualifier: Diagnosis of  By: Riccardo Dubin MD, Todd     Diabetes mellitus type 2 in obese ([place]) 06/28/1994   DKA (diabetic ketoacidoses) ([place]) 12/2014   Erosive esophagitis    Esophageal reflux    Essential hypertension 11/16/2013   Gastroparesis    ? diabetic   GERD (gastroesophageal reflux disease)    Human immunodeficiency virus (HIV) disease ([place]) 04/23/2016   Hypertension    Moderate nonproliferative diabetic retinopathy of both eyes ([place]) 11/21/2014   11/14/14: Noted on retinal imaging; needs follow-up imaging in 6 months  05/22/16: Noted on retinal imaging again; needs follow-up imaging in 6 months   Normocytic anemia    TOBACCO USER 06/28/2006   Qualifier: Diagnosis of  By: Riccardo Dubin MD, Todd     Type II diabetes mellitus ([place]) 1994   diagnosed around 83    Family History:  Family History  Problem Relation Age of Onset   Diabetes Mother    Diabetes Brother    Diabetes Daughter    Diabetes Daughter    Mental retardation Brother        died from PNA   Diabetes Maternal Grandmother     Social History: She lives at home with her daughters and is able to get around without using a walking aid device.  She smoked half a pack of cigarettes a day for 10 years.  She quit 5 years ago.  She uses marijuana 3 times a week.  She denies alcohol use.  Review of Systems: A complete ROS was negative except as per HPI.   Physical Exam: Blood pressure (!) 144/84, pulse 93, temperature 99 F (37.2 C), temperature source Oral,  resp. rate 20, height 5\' 5"  (1.651 m), weight 74.8 kg, SpO2 98 %. General: Lying in bed in no acute distress.  She does appear uncomfortable. HEENT: Normocephalic, atraumatic Cardiovascular: Normal rate, regular rhythm Respiratory: Clear to auscultation bilaterally, normal work of breathing Abdominal: Soft, nontender, no masses appreciated MSK: Left below the knee amputation with pressure dressing in place.  Right lower extremity without edema, erythema, or warmth. Psych: Normal affect, mood, and behavior Skin: Warm and dry.  Assessment & Plan by Problem: Active Problems:   Wound dehiscence s/p L transmetatarsal amputation  Ms. Bayley presents after losing balance and hitting her left BKA.  She does appear to  have a wound dehiscence.  Wound VAC has been removed and a pressure dressing was placed with cessation of bleeding.  Her hemoglobin does appear to be stable at her baseline.  Will continue to monitor her blood counts and consult orthopedic surgery for further management.  Wound dehiscence of left BKA: - Trend CBCs - Plan on consulting orthopedic surgery in the morning - Consult wound care - Will keep n.p.o. in anticipation of a potential procedure - Norco every 6 hours PRN pain  Hypertension: Pressure within acceptable limits - Continue home labetalol 200 mg twice daily  Type 2 diabetes: Home meds include Lantus 5 units at bedtime and Victoza 1.2 mg daily. - Hold home meds - Sliding scale insulin-moderate  HIV: -Continue home Dolutegravir-Rilpivirine  CKD stage V: Patient declined catheter placement and HD initiation at last admission.  Her creatinine does seem to be at baseline.  Will consider consulting nephrology to begin inpatient HD.  FEN/GI: N.p.o. CODE STATUS: full DVT prophylaxis: SCDs  Dispo: Admit patient to Observation with expected length of stay less than 2 midnights.  Signed: Carroll Sage, MD 10/28/2018, 1:17 AM

## 2018-10-28 NOTE — Transfer of Care (Signed)
Immediate Anesthesia Transfer of Care Note  Patient: Kathy Frank  Procedure(s) Performed: REVISION BELOW KNEE AMPUTATION (Left Leg Upper)  Patient Location: PACU  Anesthesia Type:General  Level of Consciousness: drowsy  Airway & Oxygen Therapy: Patient Spontanous Breathing and Patient connected to face mask oxygen  Post-op Assessment: Report given to RN and Post -op Vital signs reviewed and stable  Post vital signs: Reviewed and stable  Last Vitals:  Vitals Value Taken Time  BP 155/87 10/28/2018  1:27 PM  Temp    Pulse 77 10/28/2018  1:27 PM  Resp 17 10/28/2018  1:27 PM  SpO2 100 % 10/28/2018  1:27 PM  Vitals shown include unvalidated device data.  Last Pain:  Vitals:   10/28/18 0915  TempSrc:   PainSc: Asleep         Complications: No apparent anesthesia complications

## 2018-10-29 DIAGNOSIS — Z9889 Other specified postprocedural states: Secondary | ICD-10-CM

## 2018-10-29 LAB — CBC
HCT: 20.6 % — ABNORMAL LOW (ref 36.0–46.0)
HCT: 21.5 % — ABNORMAL LOW (ref 36.0–46.0)
Hemoglobin: 6.6 g/dL — CL (ref 12.0–15.0)
Hemoglobin: 6.9 g/dL — CL (ref 12.0–15.0)
MCH: 29.6 pg (ref 26.0–34.0)
MCH: 29.5 pg (ref 26.0–34.0)
MCHC: 32 g/dL (ref 30.0–36.0)
MCHC: 32.1 g/dL (ref 30.0–36.0)
MCV: 92.4 fL (ref 80.0–100.0)
MCV: 91.9 fL (ref 80.0–100.0)
Platelets: 353 10*3/uL (ref 150–400)
Platelets: 358 10*3/uL (ref 150–400)
RBC: 2.23 MIL/uL — ABNORMAL LOW (ref 3.87–5.11)
RBC: 2.34 MIL/uL — ABNORMAL LOW (ref 3.87–5.11)
RDW: 15.3 % (ref 11.5–15.5)
RDW: 16 % — ABNORMAL HIGH (ref 11.5–15.5)
WBC: 13 10*3/uL — ABNORMAL HIGH (ref 4.0–10.5)
WBC: 13.6 10*3/uL — ABNORMAL HIGH (ref 4.0–10.5)
nRBC: 0 % (ref 0.0–0.2)
nRBC: 0 % (ref 0.0–0.2)

## 2018-10-29 LAB — RENAL FUNCTION PANEL
Albumin: 2.3 g/dL — ABNORMAL LOW (ref 3.5–5.0)
Anion gap: 9 (ref 5–15)
BUN: 45 mg/dL — ABNORMAL HIGH (ref 6–20)
CO2: 19 mmol/L — ABNORMAL LOW (ref 22–32)
Calcium: 7.7 mg/dL — ABNORMAL LOW (ref 8.9–10.3)
Chloride: 107 mmol/L (ref 98–111)
Creatinine, Ser: 6.05 mg/dL — ABNORMAL HIGH (ref 0.44–1.00)
GFR calc Af Amer: 9 mL/min — ABNORMAL LOW (ref >60)
GFR calc non Af Amer: 8 mL/min — ABNORMAL LOW (ref >60)
Glucose, Bld: 111 mg/dL — ABNORMAL HIGH (ref 70–99)
Phosphorus: 4.3 mg/dL (ref 2.5–4.6)
Potassium: 5.6 mmol/L — ABNORMAL HIGH (ref 3.5–5.1)
Sodium: 135 mmol/L (ref 135–145)

## 2018-10-29 LAB — GLUCOSE, CAPILLARY
Glucose-Capillary: 110 mg/dL — ABNORMAL HIGH (ref 70–99)
Glucose-Capillary: 112 mg/dL — ABNORMAL HIGH (ref 70–99)
Glucose-Capillary: 163 mg/dL — ABNORMAL HIGH (ref 70–99)
Glucose-Capillary: 166 mg/dL — ABNORMAL HIGH (ref 70–99)
Glucose-Capillary: 171 mg/dL — ABNORMAL HIGH (ref 70–99)
Glucose-Capillary: 218 mg/dL — ABNORMAL HIGH (ref 70–99)

## 2018-10-29 LAB — HEMOGLOBIN AND HEMATOCRIT, BLOOD
HCT: 21 % — ABNORMAL LOW (ref 36.0–46.0)
Hemoglobin: 6.6 g/dL — CL (ref 12.0–15.0)

## 2018-10-29 LAB — PREPARE RBC (CROSSMATCH)

## 2018-10-29 MED ORDER — SODIUM CHLORIDE 0.9% IV SOLUTION
Freq: Once | INTRAVENOUS | Status: AC
  Administered 2018-10-29: 13:00:00 via INTRAVENOUS

## 2018-10-29 MED ORDER — HYDROMORPHONE HCL 1 MG/ML IJ SOLN
1.0000 mg | INTRAMUSCULAR | Status: DC | PRN
  Administered 2018-10-29 – 2018-10-30 (×4): 1 mg via INTRAVENOUS
  Filled 2018-10-29 (×4): qty 1

## 2018-10-29 NOTE — Progress Notes (Addendum)
Patient's HGB level: Critical low 6.2. Notified on-call MD for Internal medicine and received an order to transfuse I unit PRBC. Will continue to monitor.

## 2018-10-29 NOTE — Discharge Summary (Addendum)
Name: Kathy Frank MRN: [id number] DOB: 1976-04-14 43 y.o. PCP: Lars Mage, MD  Date of Admission: 10/27/2018  9:37 PM Date of Discharge: 10/30/2018 Attending Physician: 10/30/2018  Discharge Diagnosis: 1. Left BKA wound dehiscence 2. CKDV 3. HIV  Discharge Medications: Allergies as of 10/30/2018   No Known Allergies     Medication List    TAKE these medications   amLODipine 10 MG tablet Commonly known as:  NORVASC Take 1 tablet (10 mg total) by mouth daily.   calcitRIOL 0.25 MCG capsule Commonly known as:  ROCALTROL Take 1 capsule (0.25 mcg total) by mouth daily.   calcium acetate 667 MG capsule Commonly known as:  PHOSLO Take 2 capsules (1,334 mg total) by mouth 3 (three) times daily with meals.   citalopram 20 MG tablet Commonly known as:  CeleXA Take 1 tablet (20 mg total) by mouth daily.   Darbepoetin Alfa 60 MCG/0.3ML Sosy injection Commonly known as:  ARANESP Inject 0.3 mLs (60 mcg total) into the skin every Tuesday at 6 PM.   Dolutegravir-Rilpivirine 50-25 MG Tabs Commonly known as:  Juluca Take 1 tablet by mouth daily. Please take with largest meal of the day.   gabapentin 300 MG capsule Commonly known as:  NEURONTIN Take 1 capsule (300 mg total) by mouth daily.   glucose blood test strip Commonly known as:  Accu-Chek Guide USE TO TEST BLOOD SUGAR 3 TIMES DAILY . Diagnosis code  E11.69, E66.9   Insulin Glargine 100 UNIT/ML Solostar Pen Commonly known as:  LANTUS Inject 5 Units into the skin at bedtime.   labetalol 200 MG tablet Commonly known as:  NORMODYNE Take 1 tablet (200 mg total) by mouth 2 (two) times daily.   liraglutide 18 MG/3ML Sopn Commonly known as:  Victoza Inject 0.2 mLs (1.2 mg total) into the skin daily.   megestrol 40 MG tablet Commonly known as:  MEGACE Take 1 tablet (40 mg total) by mouth daily.   methocarbamol 500 MG tablet Commonly known as:  ROBAXIN Take 1 tablet (500 mg total) by mouth every 6 (six) hours as  needed for muscle spasms.   nutrition supplement (JUVEN) Pack Take 1 packet by mouth 2 (two) times daily between meals.   ondansetron 4 MG tablet Commonly known as:  Zofran Take 1 tablet (4 mg total) by mouth daily as needed for nausea or vomiting.   oxyCODONE 5 MG immediate release tablet Commonly known as:  Oxy IR/ROXICODONE Take 1 tablet (5 mg total) by mouth every 4 (four) hours as needed for moderate pain or severe pain.   polyethylene glycol packet Commonly known as:  MIRALAX / GLYCOLAX Take 17 g by mouth daily. What changed:    when to take this  reasons to take this   senna 8.6 MG Tabs tablet Commonly known as:  SENOKOT Take 2 tablets (17.2 mg total) by mouth 2 (two) times daily. What changed:    when to take this  reasons to take this   sodium bicarbonate 650 MG tablet Take 2 tablets (1,300 mg total) by mouth 2 (two) times daily.   sodium polystyrene 15 GM/60ML suspension Commonly known as:  KAYEXALATE Take 60 mLs (15 g total) by mouth daily.       Disposition and follow-up:   KathyKathy Frank was discharged from [facility] in Good condition.  At the hospital follow up visit please address:  1. Left BKA wound dehiscence - Please ensure patient has appropriate f/u with Dr. Sharol Given -  Hb was 7.4 at discharge. Please repeat Hb at f/u  2. CKDV - Please ensure patient has appropriate f/u with nephrology - Check renal function and potassium level at f/u  3. HIV - Please ensure patient has appropriate f/u with ID - F/u CD4 count  4.  Labs / imaging needed at time of follow-up: CBC, BMP  5.  Pending labs/ test needing follow-up: CD4 count  Follow-up Appointments: Follow-up Information    [name], MD In 1 week.   Specialty:  Orthopedic Surgery Contact information: [place] [address] [phone]        Michel Bickers, MD Follow up on 02/06/2019.   Specialty:  Infectious Diseases Why:  9:00 am  appointment  Contact information: [address] [place] [address] [place] Follow up.   Why:  Therapist will contact you for appointments.           Hospital Course by problem list: 1. Left BKA wound dehiscence: Kathy Frank is a 43 year old female with CKD stageV,HIV, type 2 diabetes, hypertension, GERD, s/p left BKA on 2/20and revision after dehiscence on3/11who presented after a mechanical fall at home and subsequent pain and bleeding at her BKA site. She underwent revision on 3/20. A wound vac was placed, which will remain in place for one week post-op until her f/u with Dr. Sharol Given. She required blood transfusion for acute blood loss in the setting of chronic anemia. Her Hb was 7.4 at discharge and there were no further signs of bleeding. She declined CIR or SNF placement. She has been set up with outpatient therapy.   2. CKDV: Patient declined catheter placement and HD initiation at last admission.She's waiting for her AVF to mature before beginning HD. Kidney function was improved during this hospitalization compared to her prior. Cr at discharge was 6. Continued on kayexalate for mild hyperkalemia. She has f/u with nephrology scheduled in less than 1 week.   3. HIV: Remains on HAART. Currently well-controlled with undetectable viral load. CD4 count pending at discharge. She will f/u with ID in 3 months.   Discharge Vitals:   BP (!) 170/72 (BP Location: Left Arm)   Pulse 95   Temp 100 F (37.8 C) (Oral)   Resp 18   Ht 5\' 5"  (1.651 m)   Wt 74.8 kg   SpO2 99%   BMI 27.46 kg/m   Pertinent Labs, Studies, and Procedures:  CBC Latest Ref Rng & Units 10/30/2018 10/30/2018 10/29/2018  WBC 4.0 - 10.5 K/uL - 12.4(H) -  Hemoglobin 12.0 - 15.0 g/dL 7.4(L) 6.9(LL) 6.6(LL)  Hematocrit 36.0 - 46.0 % 22.1(L) 21.6(L) 21.0(L)  Platelets 150 - 400 K/uL - 301 -   CMP Latest Ref Rng & Units 10/30/2018 10/29/2018 10/28/2018  Glucose 70 - 99  mg/dL 180(H) 111(H) 70  BUN 6 - 20 mg/dL 48(H) 45(H) 40(H)  Creatinine 0.44 - 1.00 mg/dL 6.02(H) 6.05(H) 5.89(H)  Sodium 135 - 145 mmol/L 135 135 140  Potassium 3.5 - 5.1 mmol/L 5.2(H) 5.6(H) 4.5  Chloride 98 - 111 mmol/L 107 107 110  CO2 22 - 32 mmol/L 20(L) 19(L) 20(L)  Calcium 8.9 - 10.3 mg/dL 7.8(L) 7.7(L) 8.2(L)  Total Protein 6.5 - 8.1 g/dL - - -  Total Bilirubin 0.3 - 1.2 mg/dL - - -  Alkaline Phos 38 - 126 U/L - - -  AST 15 - 41 U/L - - -  ALT 0 -  44 U/L - - -     Discharge Instructions: Discharge Instructions    Discharge instructions   Complete by:  As directed    Ms. Graybill,  1. For your left leg: - Please be careful to prevent and avoid falls at home - Work with the physical therapist as scheduled.  - Keep the wound vac on for one week. - Follow-up with Dr. Sharol Given in one week. - You can take oxycodone, robaxin, and tylenol for pain as needed  For your kidney disease - Keep taking the kayexalate to keep your potassium level normal - Follow-up with the kidney doctors next week as scheduled  Feel free to call our clinic at [phone] if you have any questions.  Thanks, Dr. Annie Paras   Increase activity slowly   Complete by:  As directed       Signed: Dorrell, [name], MD 10/30/2018, 11:33 AM   Pager: [phone]    Internal Medicine Attending Note:  I saw and examined the patient on the day of discharge. I reviewed and agree with the discharge summary written by the house staff.  Lenice Pressman, M.D., Ph.D.

## 2018-10-29 NOTE — Progress Notes (Signed)
PT Cancellation Note  Patient Details Name: Kathy Frank MRN: [id number] DOB: 1976/07/21   Cancelled Treatment:     Unable to do PT eval as pt getting blood (hbg 6.9) and nursing doesn't want her to move.  I talked with pt - first fall - she tripped over rug.  Second fall getting something out of refridgerator. She now has fear of falling.  Pt not wanting SNF.  PT will eval tomorrow and I anticipate recommending [facility] services.  Pt reports she has equipment - hardest thing is her 3 steps in and out of house.   Loyal Buba 10/29/2018, 2:23 PM

## 2018-10-29 NOTE — Progress Notes (Signed)
   Subjective: Overnight, Kathy Frank received one unit of blood for a post-op Hb of 6.4. This morning she reports that the surgery went well. She has some pain at the surgical site, but it is currently well-controlled. She has f/u appointments with ortho and nephrology scheduled in the next week. She would like to be discharged home today. She once again declines SNF. She reports she will be staying with her family who can help her manage at home.   Objective:  Vital signs in last 24 hours: Vitals:   10/29/18 0014 10/29/18 0017 10/29/18 0023 10/29/18 0427  BP: (!) 165/104 (!) 183/95 (!) 131/96 (!) 146/78  Pulse: 88  83 93  Resp:   17   Temp: 99.9 F (37.7 C)  99.2 F (37.3 C) 98.6 F (37 C)  TempSrc: Oral  Oral Oral  SpO2: 98%  100% 97%  Weight:      Height:       Gen: alert and oriented, no distress CV: RRR, systolic murmur Pulm: CTAB, normal effort on room air Abd: soft, non-distended, non-tender Ext: L BKA with wound vac in place, scant serosanguinous drainage in wound vac cannister, no RLE edema  Assessment/Plan:  Principal Problem:   Wound dehiscence Active Problems:   Human immunodeficiency virus (HIV) disease ([place])   Fall due to stumbling   Below-knee amputation of left lower extremity [facility])  Kathy Frank is a 43 year old female with CKD stageV,HIV, type 2 diabetes, hypertension, GERD, s/p left BKA on 2/20 and revision after dehiscence on 3/11 who presented after a mechanical fall at home and subsequent pain and bleeding at her BKA site. She underwent revision on 3/20.  Wound dehiscence of left BKA - Received 1u RBCs for Hb of 6.4. Post-transfusion Hb this am 6.9. Scant serosanguinous drainage in the wound vac cannister, but no other signs of bleeding.  - Pt continues to decline CIR or SNF. She understands the risks of returning home. Plan - Transfuse 1u RBCs. Post-transfusion Hb. - Continue wound vac for one week. F/u with Dr. Sharol Given in one week. - Pain control:  tylenol, robaxin, gabapentin, and oxycodone q4hrs prn   CKD stage V - Patient declined catheter placement and HD initiation at last admission. She's waiting for her AVF to mature before beginning HD. - Cr stable at 6 - Hyperkalemic to 5.6 today. Did not receive kayexalate yesterday as scheduled because of her surgery. Plan - Trend BMP - Continue kayexalate 15g daily - F/u with nephrology appt in a few days  Hypertension - BP in the 332R-518A systolic Plan - Continue home labetalol 200 mg twice daily  Type 2 diabetes - Home meds include Lantus 5 units at bedtime and Victoza 1.2 mg daily. Blood sugars within goal. Plan - SSI  HIV - Patient seen by ID yesterday. HIV is well-controlled with undetectable virus load.  Plan - Continuehome Dolutegravir-Rilpivirine - CD4 count today - F/u outpatient with ID in 3 months  Dispo: Anticipated discharge today or tomorrow.   Rito Lecomte, [name], MD 10/29/2018, 6:29 AM Pager: [phone]

## 2018-10-29 NOTE — Plan of Care (Signed)

## 2018-10-29 NOTE — Progress Notes (Signed)
Patient ID: Kathy Frank, female   DOB: October 29, 1975, 43 y.o.   MRN: [id number] Patient is postoperative day 1 revision below the knee amputation secondary to a fall on the residual limb.  There is no drainage in the wound VAC canister there is a good seal.  Plan for discharge to home when safe with therapy.

## 2018-10-29 NOTE — TOC Transition Note (Signed)
Transition of Care [facility]) - CM/SW Discharge Note   Patient Details  Name: Kathy Frank MRN: [id number] Date of Birth: 1976-07-19  [facility]) CM/SW Contact:  Claudie Leach, RN Phone Number: 10/29/2018, 11:56 AM   Clinical Narrative:   Pt readmitted after fall at home and recent BKA.    Final next level of care: [place] Barriers to Discharge: No Barriers Identified   Patient Goals and CMS Choice Patient states their goals for this hospitalization and ongoing recovery are:: to return home CMS Medicare.gov Compare Post Acute Care list provided to:: Patient Choice offered to / list presented to : Patient  Discharge Placement  Pt to d/c with wound vac and f/u with Dr. Sharol Given in 1 week in office.  Pt had been referred to OPPT but had not started.  Outpatient therapy is now closed due to COVID 19.  Patient now agrees to Home health PT.  Referral given to [facility] with [place].    Pt received necessary DME after last hospitalization.   Discharge Plan and Services In-house Referral: NA Discharge Planning Services: CM Consult Post Acute Care Choice: Home Health          DME Arranged: N/A   HH Arranged: PT [place] Agency: [place] ([place])     Readmission Risk Interventions Readmission Risk Prevention Plan 10/29/2018  Transportation Screening Complete  Medication Review (Consulting civil engineer) Referral to Pharmacy  PCP or Specialist appointment within 3-5 days of discharge Not Complete  PCP/Specialist Appt Not Complete comments (No Data)  [place] or [place] Complete  [place] Not Applicable  Some recent data might be hidden

## 2018-10-30 DIAGNOSIS — E875 Hyperkalemia: Secondary | ICD-10-CM

## 2018-10-30 DIAGNOSIS — I770 Arteriovenous fistula, acquired: Secondary | ICD-10-CM

## 2018-10-30 LAB — RENAL FUNCTION PANEL
Albumin: 2.3 g/dL — ABNORMAL LOW (ref 3.5–5.0)
Anion gap: 8 (ref 5–15)
BUN: 48 mg/dL — ABNORMAL HIGH (ref 6–20)
CO2: 20 mmol/L — ABNORMAL LOW (ref 22–32)
Calcium: 7.8 mg/dL — ABNORMAL LOW (ref 8.9–10.3)
Chloride: 107 mmol/L (ref 98–111)
Creatinine, Ser: 6.02 mg/dL — ABNORMAL HIGH (ref 0.44–1.00)
GFR calc Af Amer: 9 mL/min — ABNORMAL LOW (ref >60)
GFR calc non Af Amer: 8 mL/min — ABNORMAL LOW (ref >60)
Glucose, Bld: 180 mg/dL — ABNORMAL HIGH (ref 70–99)
Phosphorus: 4.2 mg/dL (ref 2.5–4.6)
Potassium: 5.2 mmol/L — ABNORMAL HIGH (ref 3.5–5.1)
Sodium: 135 mmol/L (ref 135–145)

## 2018-10-30 LAB — GLUCOSE, CAPILLARY
Glucose-Capillary: 114 mg/dL — ABNORMAL HIGH (ref 70–99)
Glucose-Capillary: 143 mg/dL — ABNORMAL HIGH (ref 70–99)
Glucose-Capillary: 182 mg/dL — ABNORMAL HIGH (ref 70–99)
Glucose-Capillary: 182 mg/dL — ABNORMAL HIGH (ref 70–99)

## 2018-10-30 LAB — CBC
HCT: 21.6 % — ABNORMAL LOW (ref 36.0–46.0)
Hemoglobin: 6.9 g/dL — CL (ref 12.0–15.0)
MCH: 28.9 pg (ref 26.0–34.0)
MCHC: 31.9 g/dL (ref 30.0–36.0)
MCV: 90.4 fL (ref 80.0–100.0)
Platelets: 301 10*3/uL (ref 150–400)
RBC: 2.39 MIL/uL — ABNORMAL LOW (ref 3.87–5.11)
RDW: 15.6 % — ABNORMAL HIGH (ref 11.5–15.5)
WBC: 12.4 10*3/uL — ABNORMAL HIGH (ref 4.0–10.5)
nRBC: 0 % (ref 0.0–0.2)

## 2018-10-30 LAB — HEMOGLOBIN AND HEMATOCRIT, BLOOD
HCT: 22.1 % — ABNORMAL LOW (ref 36.0–46.0)
Hemoglobin: 7.4 g/dL — ABNORMAL LOW (ref 12.0–15.0)

## 2018-10-30 LAB — PREPARE RBC (CROSSMATCH)

## 2018-10-30 MED ORDER — OXYCODONE HCL 5 MG PO TABS: 5.0000 mg | ORAL_TABLET | ORAL | 0 refills | Status: DC | PRN

## 2018-10-30 MED ORDER — SODIUM CHLORIDE 0.9% IV SOLUTION
Freq: Once | INTRAVENOUS | Status: AC
  Administered 2018-10-30: 02:00:00 via INTRAVENOUS

## 2018-10-30 NOTE — Anesthesia Postprocedure Evaluation (Signed)
Anesthesia Post Note  Patient: Kathy Frank  Procedure(s) Performed: REVISION BELOW KNEE AMPUTATION (Left Leg Upper)     Patient location during evaluation: PACU Anesthesia Type: General Level of consciousness: awake and alert Pain management: pain level controlled Vital Signs Assessment: post-procedure vital signs reviewed and stable Respiratory status: spontaneous breathing, nonlabored ventilation, respiratory function stable and patient connected to nasal cannula oxygen Cardiovascular status: blood pressure returned to baseline and stable Postop Assessment: no apparent nausea or vomiting Anesthetic complications: no    Last Vitals:  Vitals:   10/30/18 0607 10/30/18 0816  BP: (!) 174/84 (!) 170/72  Pulse: 95 95  Resp:    Temp: 37.6 C 37.8 C  SpO2: 100% 99%    Last Pain:  Vitals:   10/30/18 0923  TempSrc:   PainSc: 8                  Emojean Gertz L Dalbert Stillings

## 2018-10-30 NOTE — Progress Notes (Signed)
   Subjective: Patient required another unit of blood overnight for persistently low Hb. She denies menstruation or any other signs of bleeding currently. The patient stated that her pain was controlled. She continues to decline rehab facility and will be going home when discharged. She is in agreement with the plan to discharge home once her Hgb is stable.   Objective:  Vital signs in last 24 hours: Vitals:   10/30/18 0245 10/30/18 0300 10/30/18 0354 10/30/18 0607  BP: (!) 161/82 (!) 140/58 (!) 164/80 (!) 174/84  Pulse: 93 93 93 95  Resp: 16 18    Temp: 100 F (37.8 C) 100.1 F (37.8 C) 98.9 F (37.2 C) 99.7 F (37.6 C)  TempSrc: Oral Oral Oral Oral  SpO2: 100% 100% 100% 100%  Weight:      Height:       General: A/O x4, in no acute distress, afebrile, nondiaphoretic Cardio: RRR, systolic murmur Pulmonary: CTA bilaterally, no wheezing or crackles  Abdomen: Bowel sounds normal, soft, nontender  MSK: Left BKA stump with minimal wound vac output, RLE nontender  Psych: Appropriate affect, not depressed in appearance, engages well  Assessment/Plan:  Principal Problem:   Wound dehiscence Active Problems:   Human immunodeficiency virus (HIV) disease ([name])   Fall due to stumbling   Below-knee amputation of left lower extremity [facility])  Kathy Frank is a 43 year old female with CKD stageV,HIV, type 2 diabetes, hypertension, GERD, s/p left BKA on 2/20and revision after dehiscence on3/11who presented after a mechanical fall at home and subsequent pain and bleeding at her BKA site. She underwent revision on 3/20.  Wound dehiscence of left BKA - Scant serosanguinous drainage in the wound vac cannister, but no other signs of bleeding. Received her third unit of blood overnight.  - Pt continues to decline CIR or SNF. She understands the risks of returning home. Plan - F/u post-transfusion hemoglobin. If Hb > 7, discharge home. If Hb < 7, repeat transfusion. - Continue wound vac for one  week. F/u with Dr. Sharol Given in one week. - Pain control: tylenol, robaxin, gabapentin, and oxycodone q4hrs prn   CKD stage V -Patient declined catheter placement and HD initiation at last admission.She's waiting for her AVF to mature before beginning HD. - Cr stable at 6 - Hyperkalemia improved from 5.6 to 5.2 with kayexalate. Plan - Trend BMP - Continue kayexalate 15g daily - F/u with nephrology appt in a few days  Hypertension - Patient is hypertensive today, perhaps volume overloaded in the setting of blood transfusions. Will give lasix if she requires another transfusion. Plan -Continue home labetalol 200 mg twice daily  Type 2 diabetes -Home meds include Lantus 5 units at bedtime and Victoza 1.2 mg daily.Blood sugars within goal. Plan - SSI  Dispo: Anticipated discharge in approximately 0-1 days.   Naser Schuld, [name], MD 10/30/2018, 6:14 AM Pager: [phone]

## 2018-10-30 NOTE — Progress Notes (Signed)
Wound vac to left BKA connected to the [facility] home wound vac. Discharge instructions given to pt, verbalized understanding. Discharged to home accompanied by a family member.

## 2018-10-30 NOTE — Evaluation (Addendum)
Physical Therapy Evaluation Patient Details Name: Kathy Frank MRN: [id number] DOB: [birth year] Today's Date: 10/30/2018   History of Present Illness  Pt is a 43 y.o. female s/p L BKA revision due to wound dehiscence from fall at home. Pt underwent previous revision 3/11. PMH consists of s/p L BKA 2/29, CKD stage V, depression, DKA, HTN, HIV, and DM.     Clinical Impression  PT eval complete. Pt demonstrated modified independence with bed mobility and transfers. Supervision provided for in room ambulation with RW. She has had multi falls at home. She continues to decline SNF or CIR. Pt agreeable to HHPT. Plan is for pt to d/c home today. PT signing off.    Follow Up Recommendations Home health PT;Supervision for mobility/OOB    Equipment Recommendations  None recommended by PT    Recommendations for Other Services       Precautions / Restrictions Precautions Precautions: Fall;Other (comment) Precaution Comments: wound vac LLE Restrictions LLE Weight Bearing: Non weight bearing      Mobility  Bed Mobility Overal bed mobility: Modified Independent             General bed mobility comments: +rail  Transfers Overall transfer level: Modified independent Equipment used: Rolling walker (2 wheeled)             General transfer comment: Pt demo safe technique.  Ambulation/Gait Ambulation/Gait assistance: Supervision Gait Distance (Feet): 10 Feet(x 2) Assistive device: Rolling walker (2 wheeled) Gait Pattern/deviations: Step-to pattern Gait velocity: decreased Gait velocity interpretation: <1.8 ft/sec, indicate of risk for recurrent falls General Gait Details: No LOB. Good use of RW. Supervision for safety and management of wound vac.  Stairs            Wheelchair Mobility    Modified Rankin (Stroke Patients Only)       Balance Overall balance assessment: History of Falls                                           Pertinent  Vitals/Pain Pain Assessment: 0-10 Pain Score: 4  Pain Location: L residual limb Pain Descriptors / Indicators: Grimacing;Guarding;Sore Pain Intervention(s): Monitored during session;Repositioned    Home Living Family/patient expects to be discharged to:: Private residence Living Arrangements: Parent Available Help at Discharge: Family Type of Home: House Home Access: Ramped entrance     Home Layout: One level Home Equipment: Environmental consultant - 2 wheels;Bedside commode;Wheelchair - manual;Tub bench Additional Comments: Pt is staying at her mother's house.     Prior Function Level of Independence: Independent with assistive device(s)         Comments: amb with RW. No assist with ADLs.     Hand Dominance   Dominant Hand: Right    Extremity/Trunk Assessment   Upper Extremity Assessment Upper Extremity Assessment: Overall WFL for tasks assessed    Lower Extremity Assessment Lower Extremity Assessment: LLE deficits/detail LLE Deficits / Details: s/p L BKA    Cervical / Trunk Assessment Cervical / Trunk Assessment: Normal  Communication   Communication: No difficulties  Cognition Arousal/Alertness: Awake/alert Behavior During Therapy: WFL for tasks assessed/performed Overall Cognitive Status: Within Functional Limits for tasks assessed                                        General  Comments      Exercises     Assessment/Plan    PT Assessment All further PT needs can be met in the next venue of care  PT Problem List Decreased strength;Decreased range of motion;Decreased activity tolerance;Decreased balance;Decreased mobility;Decreased knowledge of use of DME;Pain;Decreased knowledge of precautions       PT Treatment Interventions      PT Goals (Current goals can be found in the Care Plan section)  Acute Rehab PT Goals Patient Stated Goal: home PT Goal Formulation: All assessment and education complete, DC therapy    Frequency     Barriers to  discharge        Co-evaluation               AM-PAC PT "6 Clicks" Mobility  Outcome Measure Help needed turning from your back to your side while in a flat bed without using bedrails?: None Help needed moving from lying on your back to sitting on the side of a flat bed without using bedrails?: None Help needed moving to and from a bed to a chair (including a wheelchair)?: None Help needed standing up from a chair using your arms (e.g., wheelchair or bedside chair)?: None Help needed to walk in hospital room?: A Little Help needed climbing 3-5 steps with a railing? : A Lot 6 Click Score: 21    End of Session Equipment Utilized During Treatment: Gait belt Activity Tolerance: Patient tolerated treatment well Patient left: in chair;with call bell/phone within reach;with chair alarm set Nurse Communication: Mobility status PT Visit Diagnosis: Muscle weakness (generalized) (M62.81);Other abnormalities of gait and mobility (R26.89);Difficulty in walking, not elsewhere classified (R26.2);History of falling (Z91.81) Pain - Right/Left: Left Pain - part of body: Leg    Time: 9969-2493 PT Time Calculation (min) (ACUTE ONLY): 16 min   Charges:   PT Evaluation $PT Eval Moderate Complexity: 1 Mod          Lorrin Goodell, PT  Office # [phone] Pager [phone]   Lorriane Shire 10/30/2018, 1:27 PM

## 2018-10-31 ENCOUNTER — Encounter (HOSPITAL_COMMUNITY): Payer: Self-pay | Admitting: Orthopedic Surgery

## 2018-10-31 LAB — TYPE AND SCREEN
ABO/RH(D): B POS
Antibody Screen: NEGATIVE
Unit division: 0
Unit division: 0
Unit division: 0

## 2018-10-31 LAB — BPAM RBC
Blood Product Expiration Date: 202003222359
Blood Product Expiration Date: 202004022359
Blood Product Expiration Date: 202004112359
ISSUE DATE / TIME: 202003202209
ISSUE DATE / TIME: 202003211312
ISSUE DATE / TIME: 202003220227
Unit Type and Rh: 1700
Unit Type and Rh: 7300
Unit Type and Rh: 9500

## 2018-10-31 LAB — T-HELPER CELLS (CD4) COUNT (NOT AT ARMC)
CD4 % Helper T Cell: 38 % (ref 33–55)
CD4 T Cell Abs: 700 /uL (ref 400–2700)

## 2018-11-03 ENCOUNTER — Ambulatory Visit (INDEPENDENT_AMBULATORY_CARE_PROVIDER_SITE_OTHER): Payer: Medicaid Other | Admitting: Orthopedic Surgery

## 2018-11-08 ENCOUNTER — Encounter (INDEPENDENT_AMBULATORY_CARE_PROVIDER_SITE_OTHER): Payer: Self-pay | Admitting: Orthopedic Surgery

## 2018-11-08 ENCOUNTER — Ambulatory Visit (INDEPENDENT_AMBULATORY_CARE_PROVIDER_SITE_OTHER): Payer: Medicaid Other | Admitting: Orthopedic Surgery

## 2018-11-08 ENCOUNTER — Other Ambulatory Visit: Payer: Self-pay

## 2018-11-08 VITALS — Ht 65.0 in | Wt 165.0 lb

## 2018-11-08 DIAGNOSIS — Z89512 Acquired absence of left leg below knee: Secondary | ICD-10-CM

## 2018-11-08 MED ORDER — OXYCODONE-ACETAMINOPHEN 5-325 MG PO TABS: 1.0000 | ORAL_TABLET | Freq: Three times a day (TID) | ORAL | 0 refills | Status: DC | PRN

## 2018-11-08 NOTE — Progress Notes (Signed)
Office Visit Note   Patient: Kathy Frank           Date of Birth: 1976/05/07           MRN: [id number] Visit Date: 11/08/2018              Requested by: Lars Mage, MD [address] [place], [place] [address] PCP: Lars Mage, MD  Chief Complaint  Patient presents with  . Left Leg - Routine Post Op    10/28/2018 revision bka left      HPI: Patient is a 43 year old woman who presents 1 week status post revision left transtibial amputation.  Patient presents with the portable Praveena wound VAC full and the pump beeping.  Assessment & Plan: Visit Diagnoses:  1. S/P below knee amputation, left ([place])     Plan: Patient will start Dial soap cleansing daily and dressing changes with a 4 x 4 plus an Ace wrap.  Patient states she lost her stump shrinker.  Reevaluate at follow-up next week.  Follow-Up Instructions: Return in about 1 week (around 11/15/2018).   Ortho Exam  Patient is alert, oriented, no adenopathy, well-dressed, normal affect, normal respiratory effort. Examination there is some slight wound maceration but no full-thickness skin defect.  There is no dehiscence of the incision there is no drainage.  No cellulitis.  Patient was given instructions and demonstrated importance of knee extension exercises.  Imaging: No results found. No images are attached to the encounter.  Labs: Lab Results  Component Value Date   HGBA1C 6.5 (H) 08/06/2018   HGBA1C 7.9 (A) 02/04/2018   HGBA1C 9.0 (H) 09/13/2017   ESRSEDRATE 21 08/06/2018   ESRSEDRATE 85 (H) 02/02/2017   CRP <0.8 10/07/2018   CRP <0.8 08/06/2018   CRP 42.4 (H) 02/02/2017   REPTSTATUS 10/12/2018 FINAL 10/07/2018   REPTSTATUS 10/09/2018 FINAL 10/07/2018   GRAMSTAIN  10/07/2018    NO WBC SEEN RARE GRAM POSITIVE COCCI IN PAIRS FEW GRAM POSITIVE RODS    CULT  10/07/2018    NO GROWTH 5 DAYS Performed at [facility], [place] [address], [place], [place] [address]    CULT  10/07/2018    NORMAL SKIN FLORA  Performed at [facility], [place] [address] [place],  [address]    [place] 07/08/2018     Lab Results  Component Value Date   ALBUMIN 2.3 (L) 10/30/2018   ALBUMIN 2.3 (L) 10/29/2018   ALBUMIN 2.3 (L) 10/28/2018    Body mass index is 27.46 kg/m.  Orders:  No orders of the defined types were placed in this encounter.  No orders of the defined types were placed in this encounter.    Procedures: No procedures performed  Clinical Data: No additional findings.  ROS:  All other systems negative, except as noted in the HPI. Review of Systems  Objective: Vital Signs: Ht 5\' 5"  (1.651 m)   Wt 165 lb (74.8 kg)   BMI 27.46 kg/m   Specialty Comments:  No specialty comments available.  PMFS History: Patient Active Problem List   Diagnosis Date Noted  . Fall due to stumbling   . Below-knee amputation of left lower extremity ([place])   . BKA stump complication ([place]) 21/30/8657  . Osteomyelitis ([place]) 10/07/2018  . Dehiscence of amputation stump ([place])   . Moderate protein-calorie malnutrition ([place])   . Adjustment disorder with mixed anxiety and depressed mood 08/15/2018  . Wound dehiscence   . Wound infection s/p L  transmetatarsal amputation   . CKD (chronic kidney disease), stage V ([place])   . Acute on chronic anemia 07/03/2018  . Fecal incontinence 02/04/2018  . Healthcare maintenance 09/17/2017  . Right leg swelling 02/26/2017  . Superficial ulcerative lesion ([place]) 02/02/2017  . Aortic atherosclerosis ([place]) 09/30/2016  . Diabetic foot ulcer associated with diabetes mellitus due to underlying condition ([place]) 07/08/2016  . Human immunodeficiency virus (HIV) disease ([place]) 04/23/2016  . Gastroparesis   . Moderate nonproliferative diabetic retinopathy of both eyes ([place]) 11/21/2014  . Esophageal reflux   . Microscopic hematuria 10/31/2014  . Essential hypertension 11/16/2013  . TOBACCO USER 06/28/2006  . Depression 06/28/2006  . Diabetes  mellitus type 2 in obese [facility]) 06/28/1994   Past Medical History:  Diagnosis Date  . CKD (chronic kidney disease) stage 5, GFR less than 15 ml/min (HCC) 06/19/2016   06/30/16: Seen by Dr. Posey Pronto [Nephrology]. Suspect etiologies to include diabetes and hypertension though cannot exclude HIV-associated nephropathy. Advised her against from using excessive Goody powder.  . Contact dermatitis 04/21/2016   07/29/16: Skin biopsy performed by [facility] notable for acute spongiotic dermatitis consistent with contact dermatitis.  . Cyclical vomiting    THC induced???  . Depression 06/28/2006   Qualifier: Diagnosis of  By: Riccardo Dubin MD, [place]    . Diabetes mellitus type 2 in obese ([place]) 06/28/1994  . DKA (diabetic ketoacidoses) ([place]) 12/2014  . Erosive esophagitis   . Esophageal reflux   . Essential hypertension 11/16/2013  . Gastroparesis    ? diabetic  . GERD (gastroesophageal reflux disease)   . Human immunodeficiency virus (HIV) disease ([place]) 04/23/2016  . Hypertension   . Moderate nonproliferative diabetic retinopathy of both eyes ([place]) 11/21/2014   11/14/14: Noted on retinal imaging; needs follow-up imaging in 6 months  05/22/16: Noted on retinal imaging again; needs follow-up imaging in 6 months  . Normocytic anemia   . TOBACCO USER 06/28/2006   Qualifier: Diagnosis of  By: Riccardo Dubin MD, Sherren Mocha    . Type II diabetes mellitus ([place]) 1994   diagnosed around 54    Family History  Problem Relation Age of Onset  . Diabetes Mother   . Diabetes Brother   . Diabetes Daughter   . Diabetes Daughter   . Mental retardation Brother        died from PNA  . Diabetes Maternal Grandmother     Past Surgical History:  Procedure Laterality Date  . AMPUTATION Left 07/08/2018   Procedure: AMPUTATION FORTH RAY LEFT FOOT;  Surgeon: [name], MD;  Location: [facility];  Service: Orthopedics;  Laterality: Left;  . AMPUTATION Left 08/09/2018   Procedure: Left Transmetatarsal Amputation;  Surgeon:  [name], MD;  Location: [facility];  Service: Orthopedics;  Laterality: Left;  . AMPUTATION Left 10/08/2018   Procedure: LEFT BELOW KNEE AMPUTATION;  Surgeon: [name], MD;  Location: [facility];  Service: Orthopedics;  Laterality: Left;  . AMPUTATION Left 10/28/2018   Procedure: REVISION BELOW KNEE AMPUTATION;  Surgeon: [name], MD;  Location: [facility];  Service: Orthopedics;  Laterality: Left;  . AV FISTULA PLACEMENT Left 10/14/2018   Procedure: Arteriovenous (Av) Fistula Creation Left Arm;  Surgeon: [name], MD;  Location: [facility];  Service: Vascular;  Laterality: Left;  . LOWER EXTREMITY ANGIOGRAPHY N/A 07/05/2018   Procedure: LOWER EXTREMITY ANGIOGRAPHY;  Surgeon: Serafina Mitchell, MD;  Location: [facility];  Service: Cardiovascular;  Laterality: N/A;  . PERIPHERAL VASCULAR BALLOON ANGIOPLASTY  Left 07/05/2018   Procedure: PERIPHERAL VASCULAR BALLOON ANGIOPLASTY;  Surgeon: Serafina Mitchell, MD;  Location: [facility];  Service: Cardiovascular;  Laterality: Left;  SFA  . STUMP REVISION Left 10/19/2018   Procedure: REVISION LEFT BELOW KNEE AMPUTATION;  Surgeon: [name], MD;  Location: [facility];  Service: Orthopedics;  Laterality: Left;  . TUBAL LIGATION  2002   Social History   Occupational History    Employer: UNEMPLOYED  Tobacco Use  . Smoking status: Former Smoker    Packs/day: 0.10    Years: 10.00    Pack years: 1.00    Types: Cigarettes    Start date: 11/17/2013    Last attempt to quit: 09/09/2014    Years since quitting: 4.1  . Smokeless tobacco: Never Used  Substance and Sexual Activity  . Alcohol use: Yes    Comment: Occasionally.  . Drug use: Yes    Frequency: 4.0 times per week    Types: Marijuana  . Sexual activity: Not on file

## 2018-11-11 ENCOUNTER — Other Ambulatory Visit: Payer: Self-pay | Admitting: Internal Medicine

## 2018-11-11 ENCOUNTER — Telehealth (INDEPENDENT_AMBULATORY_CARE_PROVIDER_SITE_OTHER): Payer: Self-pay

## 2018-11-11 ENCOUNTER — Other Ambulatory Visit: Payer: Self-pay | Admitting: Oncology

## 2018-11-11 NOTE — Telephone Encounter (Signed)
Patient cancelled one HHPT visit this week so she will only receive one visit this week.

## 2018-11-11 NOTE — Telephone Encounter (Signed)
Thank you. Noted.

## 2018-11-14 ENCOUNTER — Telehealth (INDEPENDENT_AMBULATORY_CARE_PROVIDER_SITE_OTHER): Payer: Self-pay

## 2018-11-14 NOTE — Telephone Encounter (Signed)
I called pt and she answered all COVID-19 prescreen questions NO. Has an appt tomorrow.

## 2018-11-15 ENCOUNTER — Other Ambulatory Visit: Payer: Self-pay

## 2018-11-15 ENCOUNTER — Ambulatory Visit (INDEPENDENT_AMBULATORY_CARE_PROVIDER_SITE_OTHER): Payer: Medicaid Other | Admitting: Orthopedic Surgery

## 2018-11-15 ENCOUNTER — Telehealth (INDEPENDENT_AMBULATORY_CARE_PROVIDER_SITE_OTHER): Payer: Self-pay

## 2018-11-15 DIAGNOSIS — I739 Peripheral vascular disease, unspecified: Secondary | ICD-10-CM

## 2018-11-15 NOTE — Telephone Encounter (Signed)
Too soon for refill.  She may try over-the-counter anti-inflammatories or Tylenol if she can safely take these.

## 2018-11-15 NOTE — Telephone Encounter (Signed)
Called patient, no answer. Could not leave VM but was able to send SMS Notification. Hopefully she call us back. If not we can try calling again.

## 2018-11-15 NOTE — Telephone Encounter (Signed)
Please advise, Pt last filled 11/08/2018 for Qty 20 for 7 days for Oxy-Acet 5-325mg . Thank you

## 2018-11-15 NOTE — Telephone Encounter (Signed)
Request refill on oxycodone 5-325 mg

## 2018-11-15 NOTE — Progress Notes (Signed)
[id number]

## 2018-11-15 NOTE — Telephone Encounter (Signed)
Patient called back and I advised on message below. She is aware.

## 2018-11-16 ENCOUNTER — Telehealth (INDEPENDENT_AMBULATORY_CARE_PROVIDER_SITE_OTHER): Payer: Self-pay

## 2018-11-16 NOTE — Telephone Encounter (Signed)
Tried to call pt no answer and mail box was full unable to leave message for prescreen questions.

## 2018-11-17 ENCOUNTER — Ambulatory Visit (INDEPENDENT_AMBULATORY_CARE_PROVIDER_SITE_OTHER): Payer: Medicaid Other | Admitting: Physician Assistant

## 2018-11-17 ENCOUNTER — Encounter (INDEPENDENT_AMBULATORY_CARE_PROVIDER_SITE_OTHER): Payer: Self-pay | Admitting: Orthopedic Surgery

## 2018-11-17 ENCOUNTER — Other Ambulatory Visit: Payer: Self-pay

## 2018-11-17 VITALS — Ht 65.0 in | Wt 165.0 lb

## 2018-11-17 DIAGNOSIS — Z794 Long term (current) use of insulin: Secondary | ICD-10-CM

## 2018-11-17 DIAGNOSIS — N183 Chronic kidney disease, stage 3 unspecified: Secondary | ICD-10-CM

## 2018-11-17 DIAGNOSIS — E1142 Type 2 diabetes mellitus with diabetic polyneuropathy: Secondary | ICD-10-CM

## 2018-11-17 DIAGNOSIS — Z89512 Acquired absence of left leg below knee: Secondary | ICD-10-CM

## 2018-11-17 DIAGNOSIS — E440 Moderate protein-calorie malnutrition: Secondary | ICD-10-CM

## 2018-11-17 MED ORDER — GABAPENTIN 300 MG PO CAPS: 300.0000 mg | ORAL_CAPSULE | Freq: Two times a day (BID) | ORAL | 3 refills | Status: DC

## 2018-11-17 MED ORDER — SILVER SULFADIAZINE 1 % EX CREA: 1.0000 "application " | TOPICAL_CREAM | Freq: Every day | CUTANEOUS | 0 refills | Status: DC

## 2018-11-17 MED ORDER — OXYCODONE-ACETAMINOPHEN 5-325 MG PO TABS: 1.0000 | ORAL_TABLET | Freq: Four times a day (QID) | ORAL | 0 refills | Status: DC | PRN

## 2018-11-17 NOTE — Progress Notes (Signed)
Office Visit Note   Patient: Kathy Frank           Date of Birth: 1975/11/10           MRN: [id number] Visit Date: 11/17/2018              Requested by: Lars Mage, MD [address] [place], [place] [address] PCP: Lars Mage, MD  Chief Complaint  Patient presents with  . Left Leg - Routine Post Op    10/28/2018 left BKA      HPI: The patient is a 43 year old female who is seen for postoperative follow-up of her left transtibial amputation revision on 10/28/2018.  She reports significant pain and reports she has been taking BCs but it is making her stomach hurt in addition to her Percocet.  She reports she has been taking as many as 3 Percocet at a time and we discussed that this is not a good idea and that she should only be taking 1 at a time.  We also discussed increasing her gabapentin dosing.  She has not had to start dialysis as yet.  She reports some drainage from the area.  She has not been wearing her stump shrinker stocking and reports that she may have lost them.  Another prescription for stump shrinker stockings was given to the patient she is working with [facility].  Assessment & Plan: Visit Diagnoses:  1. S/P below knee amputation, left ([place])   2. Type 2 diabetes mellitus with diabetic polyneuropathy, with long-term current use of insulin ([place])   3. CKD (chronic kidney disease) stage 3, GFR 30-59 ml/min (HCC)   4. Moderate protein malnutrition ([place])     Plan: We will leave the sutures in place for at least another week.  Organ increase her gabapentin to 300 mg twice a day.  Her Percocet was refilled and she was cautioned to only take 1 up to every 6 hours as needed for pain.  She was given a prescription for [facility] for stump shrinker stockings.  Until she obtains these she can continue to use dry gauze and Ace wrapping.  She can wash the area daily with Dial soap and water.  She will follow-up in 1 week.  Follow-Up Instructions: Return in about 1 week (around  11/24/2018).   Ortho Exam  Patient is alert, oriented, no adenopathy, well-dressed, normal affect, normal respiratory effort. The patient's left transtibial amputation site continues to have moderate edema and the sutures are under tension.  There is some minimal serous appearing drainage from the central incision line.  There is no erythema no odor.  She does lack full extension and this was reinforced with the patient that she needs to work on knee extension.  She reports she is getting physical therapy at home and I reinforced continue to work on this.  She will follow-up here in 1 week.  Imaging: No results found.   Labs: Lab Results  Component Value Date   HGBA1C 6.5 (H) 08/06/2018   HGBA1C 7.9 (A) 02/04/2018   HGBA1C 9.0 (H) 09/13/2017   ESRSEDRATE 21 08/06/2018   ESRSEDRATE 85 (H) 02/02/2017   CRP <0.8 10/07/2018   CRP <0.8 08/06/2018   CRP 42.4 (H) 02/02/2017   REPTSTATUS 10/12/2018 FINAL 10/07/2018   REPTSTATUS 10/09/2018 FINAL 10/07/2018   GRAMSTAIN  10/07/2018    NO WBC SEEN RARE GRAM POSITIVE COCCI IN PAIRS FEW GRAM POSITIVE RODS    CULT  10/07/2018    NO GROWTH 5  DAYS Performed at [facility], [place] [address], [place], [place] [address]    CULT  10/07/2018    NORMAL SKIN FLORA Performed at [facility], [place] [address] [place], [place] [address]    [place] 07/08/2018     Lab Results  Component Value Date   ALBUMIN 2.3 (L) 10/30/2018   ALBUMIN 2.3 (L) 10/29/2018   ALBUMIN 2.3 (L) 10/28/2018    Body mass index is 27.46 kg/m.  Orders:  No orders of the defined types were placed in this encounter.  Meds ordered this encounter  Medications  . gabapentin (NEURONTIN) 300 MG capsule    Sig: Take 1 capsule (300 mg total) by mouth 2 (two) times daily.    Dispense:  90 capsule    Refill:  3  . oxyCODONE-acetaminophen (PERCOCET/ROXICET) 5-325 MG tablet    Sig: Take 1 tablet by mouth every 6 (six) hours as needed for  moderate pain or severe pain.    Dispense:  28 tablet    Refill:  0  . silver sulfADIAZINE (SILVADENE) 1 % cream    Sig: Apply 1 application topically daily.    Dispense:  50 g    Refill:  0     Procedures: No procedures performed  Clinical Data: No additional findings.  ROS:  All other systems negative, except as noted in the HPI. Review of Systems  Objective: Vital Signs: Ht 5\' 5"  (1.651 m)   Wt 165 lb (74.8 kg)   BMI 27.46 kg/m   Specialty Comments:  No specialty comments available.  PMFS History: Patient Active Problem List   Diagnosis Date Noted  . Fall due to stumbling   . Below-knee amputation of left lower extremity ([place])   . BKA stump complication ([place]) 63/89/3734  . Osteomyelitis ([place]) 10/07/2018  . Dehiscence of amputation stump ([place])   . Moderate protein-calorie malnutrition ([place])   . Adjustment disorder with mixed anxiety and depressed mood 08/15/2018  . Wound dehiscence   . Wound infection s/p L transmetatarsal amputation   . CKD (chronic kidney disease), stage V ([place])   . Acute on chronic anemia 07/03/2018  . Fecal incontinence 02/04/2018  . Healthcare maintenance 09/17/2017  . Right leg swelling 02/26/2017  . Superficial ulcerative lesion ([place]) 02/02/2017  . Aortic atherosclerosis ([place]) 09/30/2016  . Diabetic foot ulcer associated with diabetes mellitus due to underlying condition ([place]) 07/08/2016  . Human immunodeficiency virus (HIV) disease ([place]) 04/23/2016  . Gastroparesis   . Moderate nonproliferative diabetic retinopathy of both eyes ([place]) 11/21/2014  . Esophageal reflux   . Microscopic hematuria 10/31/2014  . Essential hypertension 11/16/2013  . TOBACCO USER 06/28/2006  . Depression 06/28/2006  . Diabetes mellitus type 2 in obese [facility]) 06/28/1994   Past Medical History:  Diagnosis Date  . CKD (chronic kidney disease) stage 5, GFR less than 15 ml/min (HCC) 06/19/2016   06/30/16: Seen by Dr. Posey Pronto [Nephrology]. Suspect etiologies to  include diabetes and hypertension though cannot exclude HIV-associated nephropathy. Advised her against from using excessive Goody powder.  . Contact dermatitis 04/21/2016   07/29/16: Skin biopsy performed by [facility] notable for acute spongiotic dermatitis consistent with contact dermatitis.  . Cyclical vomiting    THC induced???  . Depression 06/28/2006   Qualifier: Diagnosis of  By: Riccardo Dubin MD, [place]    . Diabetes mellitus type 2 in obese ([place]) 06/28/1994  . DKA (diabetic ketoacidoses) ([place]) 12/2014  . Erosive esophagitis   . Esophageal reflux   .  Essential hypertension 11/16/2013  . Gastroparesis    ? diabetic  . GERD (gastroesophageal reflux disease)   . Human immunodeficiency virus (HIV) disease ([place]) 04/23/2016  . Hypertension   . Moderate nonproliferative diabetic retinopathy of both eyes ([place]) 11/21/2014   11/14/14: Noted on retinal imaging; needs follow-up imaging in 6 months  05/22/16: Noted on retinal imaging again; needs follow-up imaging in 6 months  . Normocytic anemia   . TOBACCO USER 06/28/2006   Qualifier: Diagnosis of  By: Riccardo Dubin MD, Sherren Mocha    . Type II diabetes mellitus ([place]) 1994   diagnosed around 70    Family History  Problem Relation Age of Onset  . Diabetes Mother   . Diabetes Brother   . Diabetes Daughter   . Diabetes Daughter   . Mental retardation Brother        died from PNA  . Diabetes Maternal Grandmother     Past Surgical History:  Procedure Laterality Date  . AMPUTATION Left 07/08/2018   Procedure: AMPUTATION FORTH RAY LEFT FOOT;  Surgeon: [name], MD;  Location: [facility];  Service: Orthopedics;  Laterality: Left;  . AMPUTATION Left 08/09/2018   Procedure: Left Transmetatarsal Amputation;  Surgeon: [name], MD;  Location: [facility];  Service: Orthopedics;  Laterality: Left;  . AMPUTATION Left 10/08/2018   Procedure: LEFT BELOW KNEE AMPUTATION;  Surgeon: [name], MD;  Location: [facility];  Service: Orthopedics;  Laterality:  Left;  . AMPUTATION Left 10/28/2018   Procedure: REVISION BELOW KNEE AMPUTATION;  Surgeon: [name], MD;  Location: [facility];  Service: Orthopedics;  Laterality: Left;  . AV FISTULA PLACEMENT Left 10/14/2018   Procedure: Arteriovenous (Av) Fistula Creation Left Arm;  Surgeon: [name], MD;  Location: [facility];  Service: Vascular;  Laterality: Left;  . LOWER EXTREMITY ANGIOGRAPHY N/A 07/05/2018   Procedure: LOWER EXTREMITY ANGIOGRAPHY;  Surgeon: Serafina Mitchell, MD;  Location: [facility];  Service: Cardiovascular;  Laterality: N/A;  . PERIPHERAL VASCULAR BALLOON ANGIOPLASTY Left 07/05/2018   Procedure: PERIPHERAL VASCULAR BALLOON ANGIOPLASTY;  Surgeon: Serafina Mitchell, MD;  Location: [facility];  Service: Cardiovascular;  Laterality: Left;  SFA  . STUMP REVISION Left 10/19/2018   Procedure: REVISION LEFT BELOW KNEE AMPUTATION;  Surgeon: [name], MD;  Location: [facility];  Service: Orthopedics;  Laterality: Left;  . TUBAL LIGATION  2002   Social History   Occupational History    Employer: UNEMPLOYED  Tobacco Use  . Smoking status: Former Smoker    Packs/day: 0.10    Years: 10.00    Pack years: 1.00    Types: Cigarettes    Start date: 11/17/2013    Last attempt to quit: 09/09/2014    Years since quitting: 4.1  . Smokeless tobacco: Never Used  Substance and Sexual Activity  . Alcohol use: Yes    Comment: Occasionally.  . Drug use: Yes    Frequency: 4.0 times per week    Types: Marijuana  . Sexual activity: Not on file

## 2018-11-24 ENCOUNTER — Encounter (HOSPITAL_COMMUNITY): Payer: Self-pay

## 2018-11-24 ENCOUNTER — Other Ambulatory Visit: Payer: Self-pay

## 2018-11-24 ENCOUNTER — Emergency Department (HOSPITAL_COMMUNITY): Payer: Medicaid Other

## 2018-11-24 ENCOUNTER — Inpatient Hospital Stay (HOSPITAL_COMMUNITY)
Admission: EM | Admit: 2018-11-24 | Discharge: 2018-11-26 | DRG: 683 | Disposition: A | Discharge status: Home / Self Care | Payer: Medicaid Other | Attending: Internal Medicine | Admitting: Internal Medicine

## 2018-11-24 ENCOUNTER — Inpatient Hospital Stay (HOSPITAL_COMMUNITY): Payer: Medicaid Other

## 2018-11-24 ENCOUNTER — Encounter (HOSPITAL_COMMUNITY): Payer: Medicaid Other

## 2018-11-24 DIAGNOSIS — I160 Hypertensive urgency: Secondary | ICD-10-CM | POA: Diagnosis present

## 2018-11-24 DIAGNOSIS — I10 Essential (primary) hypertension: Secondary | ICD-10-CM

## 2018-11-24 DIAGNOSIS — F329 Major depressive disorder, single episode, unspecified: Secondary | ICD-10-CM | POA: Diagnosis present

## 2018-11-24 DIAGNOSIS — E113393 Type 2 diabetes mellitus with moderate nonproliferative diabetic retinopathy without macular edema, bilateral: Secondary | ICD-10-CM | POA: Diagnosis present

## 2018-11-24 DIAGNOSIS — Z79899 Other long term (current) drug therapy: Secondary | ICD-10-CM | POA: Diagnosis not present

## 2018-11-24 DIAGNOSIS — B20 Human immunodeficiency virus [HIV] disease: Secondary | ICD-10-CM

## 2018-11-24 DIAGNOSIS — E1122 Type 2 diabetes mellitus with diabetic chronic kidney disease: Secondary | ICD-10-CM | POA: Diagnosis present

## 2018-11-24 DIAGNOSIS — R0600 Dyspnea, unspecified: Secondary | ICD-10-CM | POA: Diagnosis not present

## 2018-11-24 DIAGNOSIS — E119 Type 2 diabetes mellitus without complications: Secondary | ICD-10-CM

## 2018-11-24 DIAGNOSIS — Z833 Family history of diabetes mellitus: Secondary | ICD-10-CM

## 2018-11-24 DIAGNOSIS — Z21 Asymptomatic human immunodeficiency virus [HIV] infection status: Secondary | ICD-10-CM | POA: Diagnosis present

## 2018-11-24 DIAGNOSIS — R0609 Other forms of dyspnea: Secondary | ICD-10-CM | POA: Diagnosis not present

## 2018-11-24 DIAGNOSIS — R0602 Shortness of breath: Secondary | ICD-10-CM

## 2018-11-24 DIAGNOSIS — Z9862 Peripheral vascular angioplasty status: Secondary | ICD-10-CM

## 2018-11-24 DIAGNOSIS — N920 Excessive and frequent menstruation with regular cycle: Secondary | ICD-10-CM | POA: Diagnosis present

## 2018-11-24 DIAGNOSIS — N189 Chronic kidney disease, unspecified: Secondary | ICD-10-CM | POA: Diagnosis present

## 2018-11-24 DIAGNOSIS — N179 Acute kidney failure, unspecified: Secondary | ICD-10-CM | POA: Diagnosis not present

## 2018-11-24 DIAGNOSIS — D631 Anemia in chronic kidney disease: Secondary | ICD-10-CM | POA: Diagnosis present

## 2018-11-24 DIAGNOSIS — I120 Hypertensive chronic kidney disease with stage 5 chronic kidney disease or end stage renal disease: Secondary | ICD-10-CM | POA: Diagnosis present

## 2018-11-24 DIAGNOSIS — E8770 Fluid overload, unspecified: Secondary | ICD-10-CM | POA: Diagnosis not present

## 2018-11-24 DIAGNOSIS — E8779 Other fluid overload: Secondary | ICD-10-CM | POA: Diagnosis present

## 2018-11-24 DIAGNOSIS — E872 Acidosis: Secondary | ICD-10-CM | POA: Diagnosis present

## 2018-11-24 DIAGNOSIS — Z794 Long term (current) use of insulin: Secondary | ICD-10-CM

## 2018-11-24 DIAGNOSIS — Z87891 Personal history of nicotine dependence: Secondary | ICD-10-CM | POA: Diagnosis not present

## 2018-11-24 DIAGNOSIS — N185 Chronic kidney disease, stage 5: Secondary | ICD-10-CM

## 2018-11-24 DIAGNOSIS — Z89612 Acquired absence of left leg above knee: Secondary | ICD-10-CM

## 2018-11-24 DIAGNOSIS — K219 Gastro-esophageal reflux disease without esophagitis: Secondary | ICD-10-CM | POA: Diagnosis not present

## 2018-11-24 DIAGNOSIS — Z9181 History of falling: Secondary | ICD-10-CM | POA: Diagnosis not present

## 2018-11-24 DIAGNOSIS — I770 Arteriovenous fistula, acquired: Secondary | ICD-10-CM | POA: Diagnosis not present

## 2018-11-24 DIAGNOSIS — N184 Chronic kidney disease, stage 4 (severe): Secondary | ICD-10-CM

## 2018-11-24 DIAGNOSIS — Z01810 Encounter for preprocedural cardiovascular examination: Secondary | ICD-10-CM | POA: Diagnosis not present

## 2018-11-24 HISTORY — DX: Acute kidney failure, unspecified: N17.9

## 2018-11-24 HISTORY — DX: Acute kidney failure, unspecified: N18.9

## 2018-11-24 LAB — CBC WITH DIFFERENTIAL/PLATELET
Abs Immature Granulocytes: 0.03 10*3/uL (ref 0.00–0.07)
Basophils Absolute: 0.1 10*3/uL (ref 0.0–0.1)
Basophils Relative: 1 %
Eosinophils Absolute: 0.6 10*3/uL — ABNORMAL HIGH (ref 0.0–0.5)
Eosinophils Relative: 6 %
HCT: 22.9 % — ABNORMAL LOW (ref 36.0–46.0)
Hemoglobin: 7 g/dL — ABNORMAL LOW (ref 12.0–15.0)
Immature Granulocytes: 0 %
Lymphocytes Relative: 15 %
Lymphs Abs: 1.5 10*3/uL (ref 0.7–4.0)
MCH: 28.8 pg (ref 26.0–34.0)
MCHC: 30.6 g/dL (ref 30.0–36.0)
MCV: 94.2 fL (ref 80.0–100.0)
Monocytes Absolute: 0.7 10*3/uL (ref 0.1–1.0)
Monocytes Relative: 7 %
Neutro Abs: 7 10*3/uL (ref 1.7–7.7)
Neutrophils Relative %: 71 %
Platelets: 184 10*3/uL (ref 150–400)
RBC: 2.43 MIL/uL — ABNORMAL LOW (ref 3.87–5.11)
RDW: 17 % — ABNORMAL HIGH (ref 11.5–15.5)
WBC: 9.8 10*3/uL (ref 4.0–10.5)
nRBC: 0 % (ref 0.0–0.2)

## 2018-11-24 LAB — HEMOGLOBIN A1C
Hgb A1c MFr Bld: 6.2 % — ABNORMAL HIGH (ref 4.8–5.6)
Mean Plasma Glucose: 131.24 mg/dL

## 2018-11-24 LAB — BASIC METABOLIC PANEL
Anion gap: 10 (ref 5–15)
BUN: 41 mg/dL — ABNORMAL HIGH (ref 6–20)
CO2: 16 mmol/L — ABNORMAL LOW (ref 22–32)
Calcium: 8.4 mg/dL — ABNORMAL LOW (ref 8.9–10.3)
Chloride: 114 mmol/L — ABNORMAL HIGH (ref 98–111)
Creatinine, Ser: 5.66 mg/dL — ABNORMAL HIGH (ref 0.44–1.00)
GFR calc Af Amer: 10 mL/min — ABNORMAL LOW (ref >60)
GFR calc non Af Amer: 9 mL/min — ABNORMAL LOW (ref >60)
Glucose, Bld: 223 mg/dL — ABNORMAL HIGH (ref 70–99)
Potassium: 4.7 mmol/L (ref 3.5–5.1)
Sodium: 140 mmol/L (ref 135–145)

## 2018-11-24 LAB — BRAIN NATRIURETIC PEPTIDE: B Natriuretic Peptide: 1225.4 pg/mL — ABNORMAL HIGH (ref 0.0–100.0)

## 2018-11-24 LAB — GLUCOSE, CAPILLARY: Glucose-Capillary: 187 mg/dL — ABNORMAL HIGH (ref 70–99)

## 2018-11-24 MED ORDER — SODIUM CHLORIDE 0.9% IV SOLUTION: Freq: Once | INTRAVENOUS | Status: AC

## 2018-11-24 MED ORDER — RILPIVIRINE HCL 25 MG PO TABS: 25.0000 mg | ORAL_TABLET | Freq: Every day | ORAL | Status: DC

## 2018-11-24 MED ORDER — RILPIVIRINE HCL 25 MG PO TABS
25.0000 mg | ORAL_TABLET | Freq: Every day | ORAL | Status: DC
  Administered 2018-11-24 – 2018-11-26 (×3): 25 mg via ORAL
  Filled 2018-11-24 (×5): qty 1

## 2018-11-24 MED ORDER — ALBUTEROL SULFATE HFA 108 (90 BASE) MCG/ACT IN AERS
1.0000 | INHALATION_SPRAY | Freq: Once | RESPIRATORY_TRACT | Status: AC
  Administered 2018-11-24: 2 via RESPIRATORY_TRACT
  Filled 2018-11-24: qty 6.7

## 2018-11-24 MED ORDER — LABETALOL HCL 200 MG PO TABS
200.0000 mg | ORAL_TABLET | Freq: Two times a day (BID) | ORAL | Status: DC
  Administered 2018-11-24 – 2018-11-25 (×2): 200 mg via ORAL
  Filled 2018-11-24 (×3): qty 1

## 2018-11-24 MED ORDER — CITALOPRAM HYDROBROMIDE 20 MG PO TABS
20.0000 mg | ORAL_TABLET | Freq: Every day | ORAL | Status: DC
  Administered 2018-11-24 – 2018-11-26 (×3): 20 mg via ORAL
  Filled 2018-11-24 (×2): qty 2
  Filled 2018-11-24 (×2): qty 1

## 2018-11-24 MED ORDER — OXYCODONE-ACETAMINOPHEN 5-325 MG PO TABS
1.0000 | ORAL_TABLET | Freq: Four times a day (QID) | ORAL | Status: DC | PRN
  Administered 2018-11-24 – 2018-11-26 (×4): 1 via ORAL
  Filled 2018-11-24 (×4): qty 1

## 2018-11-24 MED ORDER — FUROSEMIDE 10 MG/ML IJ SOLN
40.0000 mg | Freq: Once | INTRAMUSCULAR | Status: AC
  Administered 2018-11-24: 40 mg via INTRAVENOUS
  Filled 2018-11-24: qty 4

## 2018-11-24 MED ORDER — FUROSEMIDE 10 MG/ML IJ SOLN
80.0000 mg | Freq: Two times a day (BID) | INTRAMUSCULAR | Status: DC
  Administered 2018-11-24 – 2018-11-26 (×4): 80 mg via INTRAVENOUS
  Filled 2018-11-24 (×4): qty 8

## 2018-11-24 MED ORDER — AMLODIPINE BESYLATE 5 MG PO TABS
10.0000 mg | ORAL_TABLET | Freq: Every day | ORAL | Status: DC
  Administered 2018-11-25 – 2018-11-26 (×2): 10 mg via ORAL
  Filled 2018-11-24 (×2): qty 2

## 2018-11-24 MED ORDER — AMLODIPINE BESYLATE 5 MG PO TABS
10.0000 mg | ORAL_TABLET | Freq: Once | ORAL | Status: AC
  Administered 2018-11-24: 10 mg via ORAL
  Filled 2018-11-24: qty 2

## 2018-11-24 MED ORDER — LABETALOL HCL 200 MG PO TABS
200.0000 mg | ORAL_TABLET | Freq: Once | ORAL | Status: AC
  Administered 2018-11-24: 200 mg via ORAL
  Filled 2018-11-24: qty 1

## 2018-11-24 MED ORDER — HEPARIN SODIUM (PORCINE) 5000 UNIT/ML IJ SOLN
5000.0000 [IU] | Freq: Three times a day (TID) | INTRAMUSCULAR | Status: DC
  Administered 2018-11-24 – 2018-11-26 (×5): 5000 [IU] via SUBCUTANEOUS
  Filled 2018-11-24 (×5): qty 1

## 2018-11-24 MED ORDER — AMLODIPINE BESYLATE 5 MG PO TABS: 10.0000 mg | ORAL_TABLET | Freq: Every day | ORAL | Status: DC

## 2018-11-24 MED ORDER — ACETAMINOPHEN 650 MG RE SUPP: 650.0000 mg | Freq: Four times a day (QID) | RECTAL | Status: DC | PRN

## 2018-11-24 MED ORDER — ACETAMINOPHEN 325 MG PO TABS: 650.0000 mg | ORAL_TABLET | Freq: Four times a day (QID) | ORAL | Status: DC | PRN

## 2018-11-24 MED ORDER — INSULIN ASPART 100 UNIT/ML ~~LOC~~ SOLN
0.0000 [IU] | Freq: Three times a day (TID) | SUBCUTANEOUS | Status: DC
  Administered 2018-11-25 – 2018-11-26 (×3): 1 [IU] via SUBCUTANEOUS

## 2018-11-24 MED ORDER — SODIUM BICARBONATE 650 MG PO TABS
1300.0000 mg | ORAL_TABLET | Freq: Two times a day (BID) | ORAL | Status: DC
  Administered 2018-11-24 – 2018-11-26 (×4): 1300 mg via ORAL
  Filled 2018-11-24 (×5): qty 2

## 2018-11-24 MED ORDER — FUROSEMIDE 10 MG/ML IJ SOLN
20.0000 mg | Freq: Once | INTRAMUSCULAR | Status: AC
  Administered 2018-11-24: 20 mg via INTRAVENOUS
  Filled 2018-11-24: qty 2

## 2018-11-24 MED ORDER — POLYETHYLENE GLYCOL 3350 17 G PO PACK: 17.0000 g | PACK | Freq: Every day | ORAL | Status: DC | PRN

## 2018-11-24 MED ORDER — FUROSEMIDE 10 MG/ML IJ SOLN: 60.0000 mg | Freq: Once | INTRAMUSCULAR | Status: DC

## 2018-11-24 MED ORDER — CALCITRIOL 0.25 MCG PO CAPS
0.2500 ug | ORAL_CAPSULE | Freq: Every day | ORAL | Status: DC
  Administered 2018-11-24 – 2018-11-26 (×3): 0.25 ug via ORAL
  Filled 2018-11-24 (×4): qty 1

## 2018-11-24 MED ORDER — DOLUTEGRAVIR SODIUM 50 MG PO TABS: 50.0000 mg | ORAL_TABLET | Freq: Every day | ORAL | Status: DC

## 2018-11-24 MED ORDER — SENNA 8.6 MG PO TABS: 2.0000 | ORAL_TABLET | Freq: Every day | ORAL | Status: DC | PRN

## 2018-11-24 MED ORDER — DOLUTEGRAVIR SODIUM 50 MG PO TABS
50.0000 mg | ORAL_TABLET | Freq: Every day | ORAL | Status: DC
  Administered 2018-11-24 – 2018-11-26 (×3): 50 mg via ORAL
  Filled 2018-11-24 (×3): qty 1

## 2018-11-24 NOTE — ED Triage Notes (Signed)
Pt reports increasing SOB for 3 days worse at night and difficult to lay flat.  Pt denies cough, abd pain, diarrhea.  Pt has hx of CKD and HTN.  Pt is SOB with talking but can complete full sentences.

## 2018-11-24 NOTE — ED Triage Notes (Signed)
TC from [facility] Pt needs a type and screen order placed

## 2018-11-24 NOTE — ED Notes (Signed)
ED TO INPATIENT HANDOFF REPORT  ED Nurse Name and Phone #: Luellen Pucker [phone]  S Name/Age/Gender Kathy Frank 43 y.o. female Room/Bed: 011C/011C  Code Status   Code Status: Full Code  Home/SNF/Other Home Patient oriented to: self, place, time and situation Is this baseline? Yes   Triage Complete: Triage complete  Chief Complaint sob  Triage Note Pt reports increasing SOB for 3 days worse at night and difficult to lay flat.  Pt denies cough, abd pain, diarrhea.  Pt has hx of CKD and HTN.  Pt is SOB with talking but can complete full sentences.     Allergies No Known Allergies  Level of Care/Admitting Diagnosis ED Disposition    ED Disposition Condition Comment   Admit  Hospital Area: [place] [[id number]]  Level of Care: Telemetry Medical [104]  Diagnosis: Renal failure (ARF), acute on chronic [facility]) [[id number]]  Admitting Physician: Cresenciano Lick  Attending Physician: Cresenciano Lick  Estimated length of stay: 3 - 4 days  Certification:: I certify this patient will need inpatient services for at least 2 midnights  Possible Covid Disease Patient Isolation: Low Risk  (Less than 4L [place] supplementation)  PT Class (Do Not Modify): Inpatient [101]  PT Acc Code (Do Not Modify): Private [1]       B Medical/Surgery History Past Medical History:  Diagnosis Date  . CKD (chronic kidney disease) stage 5, GFR less than 15 ml/min (HCC) 06/19/2016   06/30/16: Seen by Dr. Posey Pronto [Nephrology]. Suspect etiologies to include diabetes and hypertension though cannot exclude HIV-associated nephropathy. Advised her against from using excessive Goody powder.  . Contact dermatitis 04/21/2016   07/29/16: Skin biopsy performed by [facility] notable for acute spongiotic dermatitis consistent with contact dermatitis.  . Cyclical vomiting    THC induced???  . Depression 06/28/2006   Qualifier: Diagnosis of  By: Riccardo Dubin MD, [place]    . Diabetes mellitus  type 2 in obese ([place]) 06/28/1994  . DKA (diabetic ketoacidoses) ([place]) 12/2014  . Erosive esophagitis   . Esophageal reflux   . Essential hypertension 11/16/2013  . Gastroparesis    ? diabetic  . GERD (gastroesophageal reflux disease)   . Human immunodeficiency virus (HIV) disease ([place]) 04/23/2016  . Hypertension   . Moderate nonproliferative diabetic retinopathy of both eyes ([place]) 11/21/2014   11/14/14: Noted on retinal imaging; needs follow-up imaging in 6 months  05/22/16: Noted on retinal imaging again; needs follow-up imaging in 6 months  . Normocytic anemia   . TOBACCO USER 06/28/2006   Qualifier: Diagnosis of  By: Riccardo Dubin MD, Sherren Mocha    . Type II diabetes mellitus ([place]) 1994   diagnosed around 1994   Past Surgical History:  Procedure Laterality Date  . AMPUTATION Left 07/08/2018   Procedure: AMPUTATION FORTH RAY LEFT FOOT;  Surgeon: [name], MD;  Location: [place];  Service: Orthopedics;  Laterality: Left;  . AMPUTATION Left 08/09/2018   Procedure: Left Transmetatarsal Amputation;  Surgeon: [name], MD;  Location: [place];  Service: Orthopedics;  Laterality: Left;  . AMPUTATION Left 10/08/2018   Procedure: LEFT BELOW KNEE AMPUTATION;  Surgeon: [name], MD;  Location: [place];  Service: Orthopedics;  Laterality: Left;  . AMPUTATION Left 10/28/2018   Procedure: REVISION BELOW KNEE AMPUTATION;  Surgeon: [name], MD;  Location: [place];  Service: Orthopedics;  Laterality: Left;  . AV FISTULA PLACEMENT Left 10/14/2018   Procedure: Arteriovenous (Av) Fistula Creation Left Arm;  Surgeon: [name], MD;  Location: [place];  Service: Vascular;  Laterality: Left;  . LOWER EXTREMITY ANGIOGRAPHY N/A 07/05/2018   Procedure: LOWER EXTREMITY ANGIOGRAPHY;  Surgeon: Serafina Mitchell, MD;  Location: [place] [facility];  Service: Cardiovascular;  Laterality: N/A;  . PERIPHERAL VASCULAR BALLOON ANGIOPLASTY Left 07/05/2018   Procedure: PERIPHERAL VASCULAR BALLOON ANGIOPLASTY;   Surgeon: Serafina Mitchell, MD;  Location: [place] [facility];  Service: Cardiovascular;  Laterality: Left;  SFA  . STUMP REVISION Left 10/19/2018   Procedure: REVISION LEFT BELOW KNEE AMPUTATION;  Surgeon: [name], MD;  Location: [place];  Service: Orthopedics;  Laterality: Left;  . TUBAL LIGATION  2002     A IV Location/Drains/Wounds Patient Lines/Drains/Airways Status   Active Line/Drains/Airways    Name:   Placement date:   Placement time:   Site:   Days:   Fistula / Graft Left Forearm Arteriovenous fistula   10/14/18    0843    Forearm   41   Negative Pressure Wound Therapy Leg Left   10/28/18    1251    -   27   Incision (Closed) 10/14/18 Arm Left   10/14/18    0845     41   Incision (Closed) 10/28/18 Leg Left   10/28/18    1302     27          Intake/Output Last 24 hours No intake or output data in the 24 hours ending 11/24/18 1432  Labs/Imaging Results for orders placed or performed during the hospital encounter of 11/24/18 (from the past 48 hour(s))  CBC with Differential     Status: Abnormal   Collection Time: 11/24/18 10:10 AM  Result Value Ref Range   WBC 9.8 4.0 - 10.5 K/uL   RBC 2.43 (L) 3.87 - 5.11 MIL/uL   Hemoglobin 7.0 (L) 12.0 - 15.0 g/dL    Comment: REPEATED TO VERIFY   HCT 22.9 (L) 36.0 - 46.0 %   MCV 94.2 80.0 - 100.0 fL   MCH 28.8 26.0 - 34.0 pg   MCHC 30.6 30.0 - 36.0 g/dL   RDW 17.0 (H) 11.5 - 15.5 %   Platelets 184 150 - 400 K/uL   nRBC 0.0 0.0 - 0.2 %   Neutrophils Relative % 71 %   Neutro Abs 7.0 1.7 - 7.7 K/uL   Lymphocytes Relative 15 %   Lymphs Abs 1.5 0.7 - 4.0 K/uL   Monocytes Relative 7 %   Monocytes Absolute 0.7 0.1 - 1.0 K/uL   Eosinophils Relative 6 %   Eosinophils Absolute 0.6 (H) 0.0 - 0.5 K/uL   Basophils Relative 1 %   Basophils Absolute 0.1 0.0 - 0.1 K/uL   Immature Granulocytes 0 %   Abs Immature Granulocytes 0.03 0.00 - 0.07 K/uL    Comment: Performed at [facility], [address] [place], [place] [address]   Brain natriuretic peptide     Status: Abnormal   Collection Time: 11/24/18 10:10 AM  Result Value Ref Range   B Natriuretic Peptide 1,225.4 (H) 0.0 - 100.0 pg/mL    Comment: Performed at [facility] [address], [place], [place] [address]  Basic metabolic panel     Status: Abnormal   Collection Time: 11/24/18 10:45 AM  Result Value Ref Range   Sodium 140 135 - 145 mmol/L   Potassium 4.7 3.5 - 5.1 mmol/L   Chloride 114 (H) 98 - 111 mmol/L  CO2 16 (L) 22 - 32 mmol/L   Glucose, Bld 223 (H) 70 - 99 mg/dL   BUN 41 (H) 6 - 20 mg/dL   Creatinine, Ser 5.66 (H) 0.44 - 1.00 mg/dL   Calcium 8.4 (L) 8.9 - 10.3 mg/dL   GFR calc non Af Amer 9 (L) >60 mL/min   GFR calc Af Amer 10 (L) >60 mL/min   Anion gap 10 5 - 15    Comment: Performed at [facility] [address] [place], [place] [address]   Dg Chest Port 1 View  Result Date: 11/24/2018 CLINICAL DATA:  Short of breath cough 3 days EXAM: PORTABLE CHEST 1 VIEW COMPARISON:  07/02/2018 FINDINGS: Cardiac enlargement with vascular congestion and mild interstitial edema. Small bilateral pleural effusions and mild bibasilar atelectasis. IMPRESSION: Mild fluid overload with interstitial edema and small bilateral pleural effusions. Electronically Signed   By: Franchot Gallo M.D.   On: 11/24/2018 10:57    Pending Labs Unresulted Labs (From admission, onward)    Start     Ordered   11/25/18 0500  CBC  Tomorrow morning,   R     11/24/18 1333   11/25/18 0500  Renal function panel  Tomorrow morning,   R     11/24/18 1403   11/24/18 1353  Hemoglobin A1c  Add-on,   R    Comments:  To assess prior glycemic control    11/24/18 1353          Vitals/Pain Today's Vitals   11/24/18 1100 11/24/18 1315 11/24/18 1330 11/24/18 1344  BP:  (!) 160/74 (!) 156/69   Pulse: 98 90 87   Resp: 20     Temp:    98.3 F (36.8 C)  TempSrc:    Oral  SpO2: 98% 98% 91%   Weight:      Height:      PainSc:        Isolation Precautions No  active isolations  Medications Medications  citalopram (CELEXA) tablet 20 mg (has no administration in time range)  calcitRIOL (ROCALTROL) capsule 0.25 mcg (has no administration in time range)  polyethylene glycol (MIRALAX / GLYCOLAX) packet 17 g (has no administration in time range)  senna (SENOKOT) tablet 17.2 mg (has no administration in time range)  acetaminophen (TYLENOL) tablet 650 mg (has no administration in time range)    Or  acetaminophen (TYLENOL) suppository 650 mg (has no administration in time range)  heparin injection 5,000 Units (has no administration in time range)  amLODipine (NORVASC) tablet 10 mg (has no administration in time range)  sodium bicarbonate tablet 1,300 mg (has no administration in time range)  insulin aspart (novoLOG) injection 0-9 Units (has no administration in time range)  dolutegravir (TIVICAY) tablet 50 mg (has no administration in time range)  rilpivirine (EDURANT) tablet 25 mg (has no administration in time range)  furosemide (LASIX) injection 40 mg (has no administration in time range)  oxyCODONE-acetaminophen (PERCOCET/ROXICET) 5-325 MG per tablet 1 tablet (has no administration in time range)  albuterol (PROVENTIL HFA;VENTOLIN HFA) 108 (90 Base) MCG/ACT inhaler 1-2 puff (2 puffs Inhalation Given 11/24/18 0948)  amLODipine (NORVASC) tablet 10 mg (10 mg Oral Given 11/24/18 1015)  labetalol (NORMODYNE) tablet 200 mg (200 mg Oral Given 11/24/18 1015)  furosemide (LASIX) injection 20 mg (20 mg Intravenous Given 11/24/18 1322)    Mobility walks with person assist Low fall risk   Focused Assessments Renal Assessment Handoff:  Hemodialysis Schedule: Hemodialysis Schedule: Monday/Wednesday/Friday  Last Hemodialysis date and time:    Restricted appendage: left arm     R Recommendations: See Admitting Provider Note  Report given to:   Additional Notes:

## 2018-11-24 NOTE — Discharge Summary (Signed)
Name: Kathy Frank MRN: [id number] DOB: [birth year] 43 y.o. PCP: Lars Mage, MD  Date of Admission: 11/24/2018  9:26 AM Date of Discharge: 4/18/20204/18/2020 Attending Physician: No att. providers found  Discharge Diagnosis: 1. Acute on Chronic Renal Failure 2. Dyspnea 2/2 Volume Overload 3. Chroni Normocytic Anemia 4. Hypertensive Urgency  Discharge Medications: Allergies as of 11/26/2018   No Known Allergies     Medication List    STOP taking these medications   SPS 15 GM/60ML suspension Generic drug:  sodium polystyrene   UNKNOWN TO PATIENT     TAKE these medications   amLODipine 10 MG tablet Commonly known as:  NORVASC Take 1 tablet (10 mg total) by mouth daily.   BC HEADACHE POWDER PO Take 1 packet by mouth as needed (for headaches).   calcitRIOL 0.25 MCG capsule Commonly known as:  ROCALTROL Take 1 capsule (0.25 mcg total) by mouth daily.   calcium acetate 667 MG capsule Commonly known as:  PHOSLO Take 2 capsules (1,334 mg total) by mouth 3 (three) times daily with meals.   citalopram 20 MG tablet Commonly known as:  CeleXA Take 1 tablet (20 mg total) by mouth daily.   Darbepoetin Alfa 60 MCG/0.3ML Sosy injection Commonly known as:  ARANESP Inject 0.3 mLs (60 mcg total) into the skin every Tuesday at 6 PM.   furosemide 40 MG tablet Commonly known as:  LASIX Take 1 tablet (40 mg total) by mouth 2 (two) times daily.   gabapentin 300 MG capsule Commonly known as:  NEURONTIN Take 1 capsule (300 mg total) by mouth 2 (two) times daily.   glucose blood test strip Commonly known as:  Accu-Chek Guide USE TO TEST BLOOD SUGAR 3 TIMES DAILY . Diagnosis code  E11.69, E66.9   Insulin Glargine 100 UNIT/ML Solostar Pen Commonly known as:  LANTUS Inject 5 Units into the skin at bedtime.   Juluca 50-25 MG Tabs Generic drug:  Dolutegravir-Rilpivirine TAKE 1 TABLET BY MOUTH DAILY. PLEASE TAKE WITH LARGEST MEAL OF THE DAY. What changed:  See the new  instructions.   labetalol 300 MG tablet Commonly known as:  NORMODYNE Take 1 tablet (300 mg total) by mouth 2 (two) times daily. What changed:    medication strength  how much to take   liraglutide 18 MG/3ML Sopn Commonly known as:  Victoza Inject 0.2 mLs (1.2 mg total) into the skin daily. What changed:  when to take this   megestrol 40 MG tablet Commonly known as:  MEGACE Take 1 tablet (40 mg total) by mouth daily.   methocarbamol 500 MG tablet Commonly known as:  ROBAXIN Take 1 tablet (500 mg total) by mouth every 6 (six) hours as needed for muscle spasms.   nutrition supplement (JUVEN) Pack Take 1 packet by mouth 2 (two) times daily between meals.   ondansetron 4 MG tablet Commonly known as:  Zofran Take 1 tablet (4 mg total) by mouth daily as needed for nausea or vomiting.   oxyCODONE-acetaminophen 5-325 MG tablet Commonly known as:  PERCOCET/ROXICET Take 1 tablet by mouth every 6 (six) hours as needed for moderate pain or severe pain.   polyethylene glycol 17 g packet Commonly known as:  MIRALAX / GLYCOLAX Take 17 g by mouth daily. What changed:    when to take this  reasons to take this   senna 8.6 MG Tabs tablet Commonly known as:  SENOKOT Take 2 tablets (17.2 mg total) by mouth 2 (two) times daily.   silver sulfADIAZINE 1 %  cream Commonly known as:  Silvadene Apply 1 application topically daily. What changed:  additional instructions   sodium bicarbonate 650 MG tablet Take 2 tablets (1,300 mg total) by mouth 2 (two) times daily.       Disposition and follow-up:   Kathy Frank was [facility] from [facility] in Stable condition.  At the hospital follow up visit please address:  1.   Acute on Chronic Renal Failure: Cr. 5 at admission. Deferred dialysis until completion of fistula. Second staging planned for this month. BMP ordered through [facility].  Dyspnea 2/2 Volume Overload: improved with lasix. Discharged with lasix 40mg   bid. Continues to make urine. Chronic Normocytic Anemia: Received 1U RBCs for possible symptomatic anemia. Hemoglobin stable at discharge Hypertensive Urgency: Remained hypertensive despite diuresis. Labetalol was increased to 300MG  bid.   2.  Labs / imaging needed at time of follow-up: BMP ordered through [facility]  3.  Pending labs/ test needing follow-up: none  Follow-up Appointments:   Internal [facility] Course by problem list: Kathy Frank is a 43 yo F w/ a PMHx notable for CKD V s/p fistula placement on 10/14/2018, left AKA, HTN, GERD, diabetes who presented with a three day history of progressively worsening dyspnea, worse with laying flat. Chest xray showed interstitial edema and pleural effusion. She has not required diuretic therapy in the outpatient setting in the past. She was admitted to the IMTS service and nephrology and vascular surgery were consulted. She has a stage I fistula and was seen by vascular.  Vascular US showed maturation of the stage I fistula. They were unable to complete the second stage of her fistula during admission but she will follow-up in the outpatient setting. She was thought to have symptomatic anemia in addition to volume overload 2/2 to her CKD and was transfused 1U RBCs with improvement in hemoglobin. Hemoglobin remained stable throughout admission. While she showed signs of volume overload, she had no LE swelling. Echo was done to rule out heart failure and showed normal EF with mild L ventricular hypertrophy, elevated end disastolic pressure, and mildly enlarged left atrium. She was diuresed with 80 mg lasix IV bid with improvement in breathing. She was discharged with 40 mg lasix po bid. Her labetalol was also increased to 300 mg bid for persistent hypertension. She will follow-up for a telehealth visit with the IMTS clinic.    Discharge Vitals:   BP (!) 160/76 (BP Location: Right Arm)   Pulse 88   Temp 98.6 F (37  C) (Oral)   Resp 18   Ht 5\' 5"  (1.651 m)   Wt 74.8 kg   LMP 10/28/2018   SpO2 98%   BMI 27.44 kg/m   Pertinent Labs, Studies, and Procedures:   ECHO 11/26/2018  1. The left ventricle has normal systolic function with an ejection fraction of 60-65%. The cavity size was normal. There is mild concentric left ventricular hypertrophy. Left ventricular diastolic Doppler parameters are consistent with  pseudonormalization. Elevated left ventricular end-diastolic pressure No evidence of left ventricular regional wall motion abnormalities.  2. The right ventricle has normal systolic function. The cavity was normal. There is no increase in right ventricular wall thickness.  3. Left atrial size was mildly dilated.  4. The pericardial effusion is localized near the right atrium.  5. Trivial pericardial effusion is present.  6. The aortic valve is tricuspid. Mild sclerosis of the aortic valve. Aortic valve regurgitation is trivial by color flow  Doppler.  CXR 11/24/2018 IMPRESSION: Mild fluid overload with interstitial edema and small bilateral pleural effusions. Electronically Signed   By: Franchot Gallo M.D.   On: 11/24/2018 10:57  Discharge Instructions: Discharge Instructions    Diet - low sodium heart healthy   Complete by:  As directed    Discharge instructions   Complete by:  As directed    You were hospitalized for pulmonary edema secondary to chronic kidney disease. Thank you for allowing [place] to be part of your care.   We arranged for you to follow up with Internal Medicine Clinic for Telehealth Visit. They will call you to schedule an appointment.   We have also scheduled for you to obtain follow-up labs at [facility] on Tuesday, April 21st between 9am and 4pm. The address is: [place]  Please note these changes made to your medications:   Please START taking furosemide (LASIX) 40 MG tablet - take one tablet in the morning and one  tablet in the evening  I have INCREASED your labetalol (NORMODYNE) to 300 MG tablet. Please take one 300 MG tablet every 12 hours.   Please STOP taking your labetalol (NORMODYNE) 200 MG tablet  Please start to weigh yourself daily and write this down. If you experience more than 5lb weight gain, began experience shortness of breath similar to this admission, or swelling in your lower extremities, please call our clinic or go to the emergency room. If you have decreased urination while taking lasix, please call the clinic.   Please call our clinic if you have any questions or concerns, we may be able to help and keep you from a long and expensive emergency room wait. Our clinic and after hours phone number is [phone], the best time to call is Monday through Friday 9 am to 4 pm but there is always someone available 24/7 if you have an emergency. If you need medication refills please notify your pharmacy one week in advance and they will send [place] a request.   Increase activity slowly   Complete by:  As directed       Signed: Molli Hazard A, DO 11/27/2018, 1:48 PM   Pager: [phone]

## 2018-11-24 NOTE — Consult Note (Addendum)
[facility] Consultation Note  Requesting MD: Gilles Chiquito, MD Reason for consult: CKD5 with fluid overload.  HPI:  Kathy Frank is a 43 y.o. female.  With history of poorly controlled diabetes, hypertension, HIV on medications, CKD stage V follows up at [facility], is status post left first stage basilic vein transposition, brachiobasilic AV fistula placement on 10/14/2018 by Dr. Carlis Abbott, baseline serum creatinine level around 5.5-6, left AKA by Dr. Sharol Given presented with worsening shortness of breath and dyspnea on exertion.  In the ER patient was found to have elevated BNP of 1225, creatinine level is stable at 5.6, CO2 16, potassium 4.7, hemoglobin 7.  Patient x-ray with interstitial edema, mild fluid overload and a small bilateral pleural effusion.  Patient denied fever, chills, nausea, vomiting, dysgeusia.  She is not on diuretics at home.  In recent admission to the hospital, patient declined catheter placement and initiation of dialysis.   Patient received Lasix 20 mg IV in ER and admitted for further evaluation.  We are consulted for the management of CKD 5 and fluid overload.   Creat  Date/Time Value Ref Range Status  05/27/2016 10:18 AM 1.68 (H) 0.50 - 1.10 mg/dL Final  11/14/2014 11:54 AM 0.92 0.50 - 1.10 mg/dL Final  11/24/2013 03:00 PM 1.03 0.50 - 1.10 mg/dL Final   Creatinine, Ser  Date/Time Value Ref Range Status  11/24/2018 10:45 AM 5.66 (H) 0.44 - 1.00 mg/dL Final  10/30/2018 01:11 AM 6.02 (H) 0.44 - 1.00 mg/dL Final  10/29/2018 02:18 AM 6.05 (H) 0.44 - 1.00 mg/dL Final  10/28/2018 06:03 AM 5.89 (H) 0.44 - 1.00 mg/dL Final  10/27/2018 11:39 PM 5.78 (H) 0.44 - 1.00 mg/dL Final  10/21/2018 03:23 AM 7.34 (H) 0.44 - 1.00 mg/dL Final  10/20/2018 03:02 AM 7.46 (H) 0.44 - 1.00 mg/dL Final  10/19/2018 07:12 AM 7.48 (H) 0.44 - 1.00 mg/dL Final  10/18/2018 02:00 AM 7.47 (H) 0.44 - 1.00 mg/dL Final  10/17/2018 02:47 AM 7.41 (H) 0.44 - 1.00 mg/dL Final   10/16/2018 07:40 PM 7.19 (H) 0.44 - 1.00 mg/dL Final  10/16/2018 05:49 AM 6.94 (H) 0.44 - 1.00 mg/dL Final  10/15/2018 04:01 AM 6.98 (H) 0.44 - 1.00 mg/dL Final  10/14/2018 02:16 AM 7.08 (H) 0.44 - 1.00 mg/dL Final  10/13/2018 03:18 AM 7.12 (H) 0.44 - 1.00 mg/dL Final  10/12/2018 05:13 AM 6.81 (H) 0.44 - 1.00 mg/dL Final  10/11/2018 07:48 AM 6.63 (H) 0.44 - 1.00 mg/dL Final  10/10/2018 03:21 AM 6.50 (H) 0.44 - 1.00 mg/dL Final  10/09/2018 04:33 AM 6.47 (H) 0.44 - 1.00 mg/dL Final  10/08/2018 03:12 AM 6.24 (H) 0.44 - 1.00 mg/dL Final  10/06/2018 10:22 PM 6.71 (H) 0.44 - 1.00 mg/dL Final  08/19/2018 11:38 AM 5.88 (H) 0.44 - 1.00 mg/dL Final  08/12/2018 02:37 AM 6.97 (H) 0.44 - 1.00 mg/dL Final  08/11/2018 01:31 PM 6.91 (H) 0.44 - 1.00 mg/dL Final  08/11/2018 03:42 AM 6.69 (H) 0.44 - 1.00 mg/dL Final  08/11/2018 01:19 AM 6.71 (H) 0.44 - 1.00 mg/dL Final  08/10/2018 04:14 PM 6.61 (H) 0.44 - 1.00 mg/dL Final  08/10/2018 03:46 AM 6.28 (H) 0.44 - 1.00 mg/dL Final  08/09/2018 03:48 AM 6.13 (H) 0.44 - 1.00 mg/dL Final  08/08/2018 02:44 AM 5.56 (H) 0.44 - 1.00 mg/dL Final  08/07/2018 05:47 AM 5.29 (H) 0.44 - 1.00 mg/dL Final  08/06/2018 04:29 PM 5.37 (H) 0.44 - 1.00 mg/dL Final  07/11/2018 05:10 AM 5.94 (H) 0.44 - 1.00 mg/dL  Final  07/10/2018 03:04 AM 5.70 (H) 0.44 - 1.00 mg/dL Final  07/09/2018 02:52 AM 5.83 (H) 0.44 - 1.00 mg/dL Final  07/08/2018 03:53 AM 5.56 (H) 0.44 - 1.00 mg/dL Final  07/07/2018 03:09 AM 5.62 (H) 0.44 - 1.00 mg/dL Final  07/06/2018 03:35 AM 5.56 (H) 0.44 - 1.00 mg/dL Final  07/05/2018 05:07 AM 6.02 (H) 0.44 - 1.00 mg/dL Final  07/04/2018 06:08 AM 5.98 (H) 0.44 - 1.00 mg/dL Final  07/03/2018 03:05 PM 5.75 (H) 0.44 - 1.00 mg/dL Final  07/03/2018 08:43 AM 6.44 (H) 0.44 - 1.00 mg/dL Final  07/02/2018 11:30 PM 6.74 (H) 0.44 - 1.00 mg/dL Final  12/23/2017 03:58 PM 3.92 (H) 0.57 - 1.00 mg/dL Final  09/14/2017 04:06 AM 3.19 (H) 0.44 - 1.00 mg/dL Final  09/13/2017 06:07 AM  3.89 (H) 0.44 - 1.00 mg/dL Final  09/13/2017 03:53 AM 3.96 (H) 0.44 - 1.00 mg/dL Final  09/12/2017 03:10 PM 3.87 (H) 0.44 - 1.00 mg/dL Final  03/14/2017 10:48 PM 2.52 (H) 0.44 - 1.00 mg/dL Final  03/14/2017 05:33 PM 2.71 (H) 0.44 - 1.00 mg/dL Final  02/02/2017 10:47 AM 2.71 (H) 0.57 - 1.00 mg/dL Final  12/02/2016 11:23 AM 2.54 (H) 0.57 - 1.00 mg/dL Final     PMHx:   Past Medical History:  Diagnosis Date  . CKD (chronic kidney disease) stage 5, GFR less than 15 ml/min (HCC) 06/19/2016   06/30/16: Seen by Dr. Posey Pronto [Nephrology]. Suspect etiologies to include diabetes and hypertension though cannot exclude HIV-associated nephropathy. Advised her against from using excessive Goody powder.  . Contact dermatitis 04/21/2016   07/29/16: Skin biopsy performed by [facility] notable for acute spongiotic dermatitis consistent with contact dermatitis.  . Cyclical vomiting    THC induced???  . Depression 06/28/2006   Qualifier: Diagnosis of  By: Riccardo Dubin MD, [place]    . Diabetes mellitus type 2 in obese ([place]) 06/28/1994  . DKA (diabetic ketoacidoses) ([place]) 12/2014  . Erosive esophagitis   . Esophageal reflux   . Essential hypertension 11/16/2013  . Gastroparesis    ? diabetic  . GERD (gastroesophageal reflux disease)   . Human immunodeficiency virus (HIV) disease ([place]) 04/23/2016  . Hypertension   . Moderate nonproliferative diabetic retinopathy of both eyes ([place]) 11/21/2014   11/14/14: Noted on retinal imaging; needs follow-up imaging in 6 months  05/22/16: Noted on retinal imaging again; needs follow-up imaging in 6 months  . Normocytic anemia   . TOBACCO USER 06/28/2006   Qualifier: Diagnosis of  By: Riccardo Dubin MD, Sherren Mocha    . Type II diabetes mellitus ([place]) 1994   diagnosed around 1994    Past Surgical History:  Procedure Laterality Date  . AMPUTATION Left 07/08/2018   Procedure: AMPUTATION FORTH RAY LEFT FOOT;  Surgeon: [name], MD;  Location: [facility];  Service: Orthopedics;   Laterality: Left;  . AMPUTATION Left 08/09/2018   Procedure: Left Transmetatarsal Amputation;  Surgeon: [name], MD;  Location: [facility];  Service: Orthopedics;  Laterality: Left;  . AMPUTATION Left 10/08/2018   Procedure: LEFT BELOW KNEE AMPUTATION;  Surgeon: [name], MD;  Location: [facility];  Service: Orthopedics;  Laterality: Left;  . AMPUTATION Left 10/28/2018   Procedure: REVISION BELOW KNEE AMPUTATION;  Surgeon: [name], MD;  Location: [facility];  Service: Orthopedics;  Laterality: Left;  . AV FISTULA PLACEMENT Left 10/14/2018   Procedure: Arteriovenous (Av) Fistula Creation Left Arm;  Surgeon: [name], MD;  Location: [place];  Service:  Vascular;  Laterality: Left;  . LOWER EXTREMITY ANGIOGRAPHY N/A 07/05/2018   Procedure: LOWER EXTREMITY ANGIOGRAPHY;  Surgeon: Serafina Mitchell, MD;  Location: [facility];  Service: Cardiovascular;  Laterality: N/A;  . PERIPHERAL VASCULAR BALLOON ANGIOPLASTY Left 07/05/2018   Procedure: PERIPHERAL VASCULAR BALLOON ANGIOPLASTY;  Surgeon: Serafina Mitchell, MD;  Location: [facility];  Service: Cardiovascular;  Laterality: Left;  SFA  . STUMP REVISION Left 10/19/2018   Procedure: REVISION LEFT BELOW KNEE AMPUTATION;  Surgeon: [name], MD;  Location: [facility];  Service: Orthopedics;  Laterality: Left;  . TUBAL LIGATION  2002    Family Hx:  Family History  Problem Relation Age of Onset  . Diabetes Mother   . Diabetes Brother   . Diabetes Daughter   . Diabetes Daughter   . Mental retardation Brother        died from PNA  . Diabetes Maternal Grandmother     Social History:  reports that she quit smoking about 4 years ago. Her smoking use included cigarettes. She started smoking about 5 years ago. She has a 1.00 pack-year smoking history. She has never used smokeless tobacco. She reports current alcohol use. She reports current drug use. Frequency: 4.00 times per week. Drug: Marijuana.  Allergies: No Known  Allergies  Medications: Prior to Admission medications   Medication Sig Start Date End Date Taking? Authorizing Provider  amLODipine (NORVASC) 10 MG tablet Take 1 tablet (10 mg total) by mouth daily. 12/24/17 10/27/27  Rice, Resa Miner, MD  calcitRIOL (ROCALTROL) 0.25 MCG capsule Take 1 capsule (0.25 mcg total) by mouth daily. 10/17/18   [name], MD  calcium acetate (PHOSLO) 667 MG capsule Take 2 capsules (1,334 mg total) by mouth 3 (three) times daily with meals. 10/16/18   [name], MD  citalopram (CELEXA) 20 MG tablet Take 1 tablet (20 mg total) by mouth daily. 08/12/18 10/27/27  Carroll Sage, MD  Darbepoetin Alfa (ARANESP) 60 MCG/0.3ML SOSY injection Inject 0.3 mLs (60 mcg total) into the skin every Tuesday at 6 PM. 10/18/18   [name], MD  gabapentin (NEURONTIN) 300 MG capsule Take 1 capsule (300 mg total) by mouth 2 (two) times daily. 11/17/18   Rayburn, Neta Mends, PA-C  glucose blood (ACCU-CHEK GUIDE) test strip USE TO TEST BLOOD SUGAR 3 TIMES DAILY . Diagnosis code  E11.69, E66.9 07/09/17   Lars Mage, MD  Insulin Glargine (LANTUS) 100 UNIT/ML Solostar Pen Inject 5 Units into the skin at bedtime. 10/16/18   [name], MD  JULUCA 50-25 MG TABS TAKE 1 TABLET BY MOUTH DAILY. PLEASE TAKE WITH LARGEST MEAL OF THE DAY. 11/11/18 02/09/19  Lars Mage, MD  labetalol (NORMODYNE) 200 MG tablet Take 1 tablet (200 mg total) by mouth 2 (two) times daily. 10/21/18   [name], MD  liraglutide (VICTOZA) 18 MG/3ML SOPN Inject 0.2 mLs (1.2 mg total) into the skin daily. 02/04/18   Chundi, Verne Spurr, MD  megestrol (MEGACE) 40 MG tablet Take 1 tablet (40 mg total) by mouth daily. 10/21/18   [name], MD  methocarbamol (ROBAXIN) 500 MG tablet Take 1 tablet (500 mg total) by mouth every 6 (six) hours as needed for muscle spasms. 10/21/18   [name], MD  nutrition supplement, JUVEN, (JUVEN) PACK Take 1 packet by mouth 2 (two) times daily  between meals. 10/16/18   [name], MD  ondansetron (ZOFRAN) 4 MG tablet Take 1 tablet (4 mg total) by mouth daily as needed for nausea or  vomiting. 08/19/18 08/19/19  Alphonzo Grieve, MD  oxyCODONE-acetaminophen (PERCOCET/ROXICET) 5-325 MG tablet Take 1 tablet by mouth every 6 (six) hours as needed for moderate pain or severe pain. 11/17/18   Rayburn, Neta Mends, PA-C  polyethylene glycol (MIRALAX / GLYCOLAX) packet Take 17 g by mouth daily. Patient taking differently: Take 17 g by mouth daily as needed for mild constipation.  08/12/18   Carroll Sage, MD  senna (SENOKOT) 8.6 MG TABS tablet Take 2 tablets (17.2 mg total) by mouth 2 (two) times daily. Patient taking differently: Take 2 tablets by mouth daily as needed for mild constipation.  08/12/18   Carroll Sage, MD  silver sulfADIAZINE (SILVADENE) 1 % cream Apply 1 application topically daily. 11/17/18   Rayburn, Neta Mends, PA-C  sodium bicarbonate 650 MG tablet Take 2 tablets (1,300 mg total) by mouth 2 (two) times daily. 10/16/18   [name], MD  SPS 15 GM/60ML suspension TAKE 60 MILLILITERS (15 G TOTAL) BY MOUTH DAILY. 11/11/18   Lars Mage, MD    I have reviewed the patient's current medications.  Labs:  Results for orders placed or performed during the hospital encounter of 11/24/18 (from the past 48 hour(s))  CBC with Differential     Status: Abnormal   Collection Time: 11/24/18 10:10 AM  Result Value Ref Range   WBC 9.8 4.0 - 10.5 K/uL   RBC 2.43 (L) 3.87 - 5.11 MIL/uL   Hemoglobin 7.0 (L) 12.0 - 15.0 g/dL    Comment: REPEATED TO VERIFY   HCT 22.9 (L) 36.0 - 46.0 %   MCV 94.2 80.0 - 100.0 fL   MCH 28.8 26.0 - 34.0 pg   MCHC 30.6 30.0 - 36.0 g/dL   RDW 17.0 (H) 11.5 - 15.5 %   Platelets 184 150 - 400 K/uL   nRBC 0.0 0.0 - 0.2 %   Neutrophils Relative % 71 %   Neutro Abs 7.0 1.7 - 7.7 K/uL   Lymphocytes Relative 15 %   Lymphs Abs 1.5 0.7 - 4.0 K/uL   Monocytes Relative 7 %   Monocytes Absolute 0.7  0.1 - 1.0 K/uL   Eosinophils Relative 6 %   Eosinophils Absolute 0.6 (H) 0.0 - 0.5 K/uL   Basophils Relative 1 %   Basophils Absolute 0.1 0.0 - 0.1 K/uL   Immature Granulocytes 0 %   Abs Immature Granulocytes 0.03 0.00 - 0.07 K/uL    Comment: Performed at [facility], [address], [place], [place] [address]  Brain natriuretic peptide     Status: Abnormal   Collection Time: 11/24/18 10:10 AM  Result Value Ref Range   B Natriuretic Peptide 1,225.4 (H) 0.0 - 100.0 pg/mL    Comment: Performed at [facility] [address] [place], [place] [address]  Basic metabolic panel     Status: Abnormal   Collection Time: 11/24/18 10:45 AM  Result Value Ref Range   Sodium 140 135 - 145 mmol/L   Potassium 4.7 3.5 - 5.1 mmol/L   Chloride 114 (H) 98 - 111 mmol/L   CO2 16 (L) 22 - 32 mmol/L   Glucose, Bld 223 (H) 70 - 99 mg/dL   BUN 41 (H) 6 - 20 mg/dL   Creatinine, Ser 5.66 (H) 0.44 - 1.00 mg/dL   Calcium 8.4 (L) 8.9 - 10.3 mg/dL   GFR calc non Af Amer 9 (L) >60 mL/min   GFR calc Af Amer 10 (L) >60 mL/min   Anion gap 10  5 - 15    Comment: Performed at [facility], [place] [address] [place], [place] [address]     ROS:  Pertinent items noted in HPI and remainder of comprehensive ROS otherwise negative.  Physical Exam: Vitals:   11/24/18 1330 11/24/18 1344  BP: (!) 156/69   Pulse: 87   Resp:    Temp:  98.3 F (36.8 C)  SpO2: 91%      General exam: Appears calm and comfortable  Respiratory system: Clear bilateral, no wheezing or crackle cardiovascular system: S1 & S2 heard, RRR no rubs.  No pedal edema. Gastrointestinal system: Abdomen is nondistended, soft and nontender. Normal bowel sounds heard. Central nervous system: Alert and oriented. No focal neurological deficits. Extremities: Left AKA Skin: No rashes, lesions or ulcers Psychiatry: Judgement and insight appear normal. Mood & affect appropriate.  Vascular: Left AV fistula has good thrill and  bruit  Assessment/Plan:  #CKD stage V with fluid overload: Due to diabetic nephropathy and hypertension.  Serum creatinine level around baseline.  Patient has shortness of breath which can be explained by anemia and fluid overload. She is refusing placement of catheter to initiate dialysis today and in previous visit. She has no overt uremic symptoms requiring urgent dialysis today.  -I will start IV Lasix 80 mg twice a day, strict ins and outs.   -Recommend blood transfusion which was discussed with the primary team and ER team as well.   -Patient is status post first stage left brachiobasilic AV fistula created on 10/14/2018.  Recommend vascular consult to evaluate if stage II fistula surgery can be done while she is in the hospital.  Hopefully patient can wait until the fistula matures to initiate dialysis. -Continue sodium bicarbonate for metabolic acidosis. -Monitor BMP, electrolytes and urine output. -CKD labs including PTH, vitamin D and anemia.  #Acute pulmonary edema/dyspnea on exertion: Diuretics as above.  Recommend blood transfusion for symptomatic anemia.  #Anemia of chronic kidney disease: I will check iron studies, blood transfusion as above.  May need ESA.  #Hypertension: Resume home medication.  Monitor blood pressure.  # left AKA  # HIV on medications.   Discussed with primary team.  We will continue to follow.  Madasyn Heath Tanna Furry 11/24/2018, 3:02 PM  [facility].

## 2018-11-24 NOTE — ED Provider Notes (Addendum)
[facility] EMERGENCY DEPARTMENT Provider Note   CSN: [id number] Arrival date & time: 11/24/18  8546    History   Chief Complaint Chief Complaint  Patient presents with  . Shortness of Breath    HPI Kathy Frank is a 43 y.o. female.     HPI    43 year old female with stage V renal disease, HIV, insulin-dependent type 2 diabetes presents today with complaints of shortness of breath.  She notes 3 days history of worsening shortness of breath worse at night difficulty lying flat.  She notes ambulation makes her shortness of breath worse.  She denies any associated chest pain or fever.  She does note some minor rhinorrhea nasal congestion.  She denies any abdominal pain vomiting or diarrhea.  Reports she is a former smoker and has not smoked in the last 5 years.  She denies any close sick contacts.  No lower extremity swelling or edema.  She is a renal patient and had a AV fistula placed at the beginning of March in anticipation of dialysis.     Past Medical History:  Diagnosis Date  . CKD (chronic kidney disease) stage 5, GFR less than 15 ml/min (HCC) 06/19/2016   06/30/16: Seen by Dr. Posey Pronto [Nephrology]. Suspect etiologies to include diabetes and hypertension though cannot exclude HIV-associated nephropathy. Advised her against from using excessive Goody powder.  . Contact dermatitis 04/21/2016   07/29/16: Skin biopsy performed by [facility] notable for acute spongiotic dermatitis consistent with contact dermatitis.  . Cyclical vomiting    THC induced???  . Depression 06/28/2006   Qualifier: Diagnosis of  By: Riccardo Dubin MD, Todd    . Diabetes mellitus type 2 in obese ([place]) 06/28/1994  . DKA (diabetic ketoacidoses) ([place]) 12/2014  . Erosive esophagitis   . Esophageal reflux   . Essential hypertension 11/16/2013  . Gastroparesis    ? diabetic  . GERD (gastroesophageal reflux disease)   . Human immunodeficiency virus (HIV) disease ([place]) 04/23/2016   . Hypertension   . Moderate nonproliferative diabetic retinopathy of both eyes ([place]) 11/21/2014   11/14/14: Noted on retinal imaging; needs follow-up imaging in 6 months  05/22/16: Noted on retinal imaging again; needs follow-up imaging in 6 months  . Normocytic anemia   . TOBACCO USER 06/28/2006   Qualifier: Diagnosis of  By: Riccardo Dubin MD, Sherren Mocha    . Type II diabetes mellitus ([place]) 1994   diagnosed around 1994    Patient Active Problem List   Diagnosis Date Noted  . Renal failure (ARF), acute on chronic ([place]) 11/24/2018  . Fall due to stumbling   . Below-knee amputation of left lower extremity ([place])   . BKA stump complication ([place]) 27/10/5007  . Osteomyelitis ([place]) 10/07/2018  . Dehiscence of amputation stump ([place])   . Moderate protein-calorie malnutrition ([place])   . Adjustment disorder with mixed anxiety and depressed mood 08/15/2018  . Wound dehiscence   . Wound infection s/p L transmetatarsal amputation   . CKD (chronic kidney disease), stage V ([place])   . Acute on chronic anemia 07/03/2018  . Fecal incontinence 02/04/2018  . Healthcare maintenance 09/17/2017  . Right leg swelling 02/26/2017  . Superficial ulcerative lesion ([place]) 02/02/2017  . Aortic atherosclerosis ([place]) 09/30/2016  . Diabetic foot ulcer associated with diabetes mellitus due to underlying condition ([place]) 07/08/2016  . Human immunodeficiency virus (HIV) disease ([place]) 04/23/2016  . Gastroparesis   . Moderate nonproliferative diabetic retinopathy of both eyes ([place]) 11/21/2014  . Esophageal reflux   .  Microscopic hematuria 10/31/2014  . Essential hypertension 11/16/2013  . TOBACCO USER 06/28/2006  . Depression 06/28/2006  . Diabetes mellitus type 2 in obese [facility]) 06/28/1994    Past Surgical History:  Procedure Laterality Date  . AMPUTATION Left 07/08/2018   Procedure: AMPUTATION FORTH RAY LEFT FOOT;  Surgeon: [name], MD;  Location: [facility];  Service: Orthopedics;  Laterality: Left;  . AMPUTATION Left  08/09/2018   Procedure: Left Transmetatarsal Amputation;  Surgeon: [name], MD;  Location: [place];  Service: Orthopedics;  Laterality: Left;  . AMPUTATION Left 10/08/2018   Procedure: LEFT BELOW KNEE AMPUTATION;  Surgeon: [name], MD;  Location: [place];  Service: Orthopedics;  Laterality: Left;  . AMPUTATION Left 10/28/2018   Procedure: REVISION BELOW KNEE AMPUTATION;  Surgeon: [name], MD;  Location: [place];  Service: Orthopedics;  Laterality: Left;  . AV FISTULA PLACEMENT Left 10/14/2018   Procedure: Arteriovenous (Av) Fistula Creation Left Arm;  Surgeon: [name], MD;  Location: [place];  Service: Vascular;  Laterality: Left;  . LOWER EXTREMITY ANGIOGRAPHY N/A 07/05/2018   Procedure: LOWER EXTREMITY ANGIOGRAPHY;  Surgeon: Serafina Mitchell, MD;  Location: [place] CV LAB;  Service: Cardiovascular;  Laterality: N/A;  . PERIPHERAL VASCULAR BALLOON ANGIOPLASTY Left 07/05/2018   Procedure: PERIPHERAL VASCULAR BALLOON ANGIOPLASTY;  Surgeon: Serafina Mitchell, MD;  Location: [place] CV LAB;  Service: Cardiovascular;  Laterality: Left;  SFA  . STUMP REVISION Left 10/19/2018   Procedure: REVISION LEFT BELOW KNEE AMPUTATION;  Surgeon: [name], MD;  Location: [place];  Service: Orthopedics;  Laterality: Left;  . TUBAL LIGATION  2002     OB History   No obstetric history on file.      Home Medications    Prior to Admission medications   Medication Sig Start Date End Date Taking? Authorizing Provider  amLODipine (NORVASC) 10 MG tablet Take 1 tablet (10 mg total) by mouth daily. 12/24/17 10/27/27  Rice, Resa Miner, MD  calcitRIOL (ROCALTROL) 0.25 MCG capsule Take 1 capsule (0.25 mcg total) by mouth daily. 10/17/18   [name], MD  calcium acetate (PHOSLO) 667 MG capsule Take 2 capsules (1,334 mg total) by mouth 3 (three) times daily with meals. 10/16/18   [name], MD  citalopram (CELEXA) 20 MG tablet Take 1 tablet (20 mg total) by mouth daily.  08/12/18 10/27/27  Carroll Sage, MD  Darbepoetin Alfa (ARANESP) 60 MCG/0.3ML SOSY injection Inject 0.3 mLs (60 mcg total) into the skin every Tuesday at 6 PM. 10/18/18   [name], MD  gabapentin (NEURONTIN) 300 MG capsule Take 1 capsule (300 mg total) by mouth 2 (two) times daily. 11/17/18   Rayburn, Neta Mends, PA-C  glucose blood (ACCU-CHEK GUIDE) test strip USE TO TEST BLOOD SUGAR 3 TIMES DAILY . Diagnosis code  E11.69, E66.9 07/09/17   Lars Mage, MD  Insulin Glargine (LANTUS) 100 UNIT/ML Solostar Pen Inject 5 Units into the skin at bedtime. 10/16/18   [name], MD  JULUCA 50-25 MG TABS TAKE 1 TABLET BY MOUTH DAILY. PLEASE TAKE WITH LARGEST MEAL OF THE DAY. 11/11/18 02/09/19  Lars Mage, MD  labetalol (NORMODYNE) 200 MG tablet Take 1 tablet (200 mg total) by mouth 2 (two) times daily. 10/21/18   [name], MD  liraglutide (VICTOZA) 18 MG/3ML SOPN Inject 0.2 mLs (1.2 mg total) into the skin daily. 02/04/18   Lars Mage, MD  megestrol (MEGACE) 40 MG tablet Take 1 tablet (40 mg total)  by mouth daily. 10/21/18   [name], MD  methocarbamol (ROBAXIN) 500 MG tablet Take 1 tablet (500 mg total) by mouth every 6 (six) hours as needed for muscle spasms. 10/21/18   [name], MD  nutrition supplement, JUVEN, (JUVEN) PACK Take 1 packet by mouth 2 (two) times daily between meals. 10/16/18   [name], MD  ondansetron (ZOFRAN) 4 MG tablet Take 1 tablet (4 mg total) by mouth daily as needed for nausea or vomiting. 08/19/18 08/19/19  Alphonzo Grieve, MD  oxyCODONE-acetaminophen (PERCOCET/ROXICET) 5-325 MG tablet Take 1 tablet by mouth every 6 (six) hours as needed for moderate pain or severe pain. 11/17/18   Rayburn, Neta Mends, PA-C  polyethylene glycol (MIRALAX / GLYCOLAX) packet Take 17 g by mouth daily. Patient taking differently: Take 17 g by mouth daily as needed for mild constipation.  08/12/18   Carroll Sage, MD  senna (SENOKOT) 8.6 MG  TABS tablet Take 2 tablets (17.2 mg total) by mouth 2 (two) times daily. Patient taking differently: Take 2 tablets by mouth daily as needed for mild constipation.  08/12/18   Carroll Sage, MD  silver sulfADIAZINE (SILVADENE) 1 % cream Apply 1 application topically daily. 11/17/18   Rayburn, Neta Mends, PA-C  sodium bicarbonate 650 MG tablet Take 2 tablets (1,300 mg total) by mouth 2 (two) times daily. 10/16/18   [name], MD  SPS 15 GM/60ML suspension TAKE 60 MILLILITERS (15 G TOTAL) BY MOUTH DAILY. 11/11/18   Lars Mage, MD    Family History Family History  Problem Relation Age of Onset  . Diabetes Mother   . Diabetes Brother   . Diabetes Daughter   . Diabetes Daughter   . Mental retardation Brother        died from PNA  . Diabetes Maternal Grandmother     Social History Social History   Tobacco Use  . Smoking status: Former Smoker    Packs/day: 0.10    Years: 10.00    Pack years: 1.00    Types: Cigarettes    Start date: 11/17/2013    Last attempt to quit: 09/09/2014    Years since quitting: 4.2  . Smokeless tobacco: Never Used  Substance Use Topics  . Alcohol use: Yes    Comment: Occasionally.  . Drug use: Yes    Frequency: 4.0 times per week    Types: Marijuana     Allergies   Patient has no known allergies.   Review of Systems Review of Systems  All other systems reviewed and are negative.   Physical Exam Updated Vital Signs BP (!) 160/74   Pulse 90   Temp 98.4 F (36.9 C) (Oral)   Resp 20   Ht 5\' 5"  (1.651 m)   Wt 74.8 kg   LMP 10/28/2018   SpO2 98%   BMI 27.46 kg/m   Physical Exam Vitals signs and nursing note reviewed.  Constitutional:      Appearance: She is well-developed.  HENT:     Head: Normocephalic and atraumatic.  Eyes:     General: No scleral icterus.       Right eye: No discharge.        Left eye: No discharge.     Conjunctiva/sclera: Conjunctivae normal.     Pupils: Pupils are equal, round, and reactive to  light.  Neck:     Musculoskeletal: Normal range of motion.     Vascular: No JVD.     Trachea: No tracheal deviation.  Pulmonary:  Effort: Pulmonary effort is normal.     Breath sounds: No stridor.     Comments: Minor faint expiratory wheeze, no crackles noted  Musculoskeletal:     Comments: Left lower extremity with BKA currently wrapped-right lower extremity without edema  Neurological:     Mental Status: She is alert and oriented to person, place, and time.     Coordination: Coordination normal.  Psychiatric:        Behavior: Behavior normal.        Thought Content: Thought content normal.        Judgment: Judgment normal.      ED Treatments / Results  Labs (all labs ordered are listed, but only abnormal results are displayed) Labs Reviewed  CBC WITH DIFFERENTIAL/PLATELET - Abnormal; Notable for the following components:      Result Value   RBC 2.43 (*)    Hemoglobin 7.0 (*)    HCT 22.9 (*)    RDW 17.0 (*)    Eosinophils Absolute 0.6 (*)    All other components within normal limits  BRAIN NATRIURETIC PEPTIDE - Abnormal; Notable for the following components:   B Natriuretic Peptide 1,225.4 (*)    All other components within normal limits  BASIC METABOLIC PANEL - Abnormal; Notable for the following components:   Chloride 114 (*)    CO2 16 (*)    Glucose, Bld 223 (*)    BUN 41 (*)    Creatinine, Ser 5.66 (*)    Calcium 8.4 (*)    GFR calc non Af Amer 9 (*)    GFR calc Af Amer 10 (*)    All other components within normal limits    EKG EKG Interpretation  Date/Time:  Thursday November 24 2018 10:52:33 EDT Ventricular Rate:  99 PR Interval:    QRS Duration: 87 QT Interval:  367 QTC Calculation: 471 R Axis:   102 Text Interpretation:  Sinus rhythm Right axis deviation Nonspecific ST abnormality Confirmed by Lajean Saver [phone]) on 11/24/2018 12:43:11 PM   Radiology Dg Chest Port 1 View  Result Date: 11/24/2018 CLINICAL DATA:  Short of breath cough 3 days  EXAM: PORTABLE CHEST 1 VIEW COMPARISON:  07/02/2018 FINDINGS: Cardiac enlargement with vascular congestion and mild interstitial edema. Small bilateral pleural effusions and mild bibasilar atelectasis. IMPRESSION: Mild fluid overload with interstitial edema and small bilateral pleural effusions. Electronically Signed   By: Franchot Gallo M.D.   On: 11/24/2018 10:57    Procedures Procedures (including critical care time)  CRITICAL CARE Performed by: Stevie Kern Kannan Proia   Total critical care time: 35 minutes  Critical care time was exclusive of separately billable procedures and treating other patients.  Critical care was necessary to treat or prevent imminent or life-threatening deterioration.  Critical care was time spent personally by me on the following activities: development of treatment plan with patient and/or surrogate as well as nursing, discussions with consultants, evaluation of patient's response to treatment, examination of patient, obtaining history from patient or surrogate, ordering and performing treatments and interventions, ordering and review of laboratory studies, ordering and review of radiographic studies, pulse oximetry and re-evaluation of patient's condition.  Medications Ordered in ED Medications  albuterol (PROVENTIL HFA;VENTOLIN HFA) 108 (90 Base) MCG/ACT inhaler 1-2 puff (2 puffs Inhalation Given 11/24/18 0948)  amLODipine (NORVASC) tablet 10 mg (10 mg Oral Given 11/24/18 1015)  labetalol (NORMODYNE) tablet 200 mg (200 mg Oral Given 11/24/18 1015)  furosemide (LASIX) injection 20 mg (20 mg Intravenous Given 11/24/18 1322)  Initial Impression / Assessment and Plan / ED Course  I have reviewed the triage vital signs and the nursing notes.  Pertinent labs & imaging results that were available during my care of the patient were reviewed by me and considered in my medical decision making (see chart for details).        Labs: CBC, BNP, BMP  Imaging: DG  chest 2 view  Consults: Internal medicine  Therapeutics: Lasix  Discharge Meds:   Assessment/Plan: 43 year old female presents today with complaints shortness of breath.  She has no significant signs of infectious etiology at this time.  She has elevated BNP, findings of fluid overload on her chest x-ray. She has acute on chronic renal failure resulting in pulmonary edema.  Patient was most recently admitted and did not want to start dialysis I feel that she would be best managed in the inpatient setting for fluid management.  Patient is also anemic here she has no significant drop from her previous, lower suspicion that this is causing her shortness of breath.  Given her work-up and no significant infectious etiology low suspicion for COVID 19 causing shortness of breath.   Final Clinical Impressions(s) / ED Diagnoses   Final diagnoses:  Hypertension, unspecified type  Shortness of breath  Anemia due to chronic kidney disease, unspecified CKD stage    ED Discharge Orders    None       Francee Gentile 11/24/18 1329    Fredia Sorrow, MD 11/26/18 1403    Okey Regal, PA-C 12/19/18 0801    Fredia Sorrow, MD 12/21/18 1751

## 2018-11-24 NOTE — H&P (Addendum)
Date: 11/24/2018               Patient Name:  Kathy Frank MRN: [id number]  DOB: [birth year] Age / Sex: 43 y.o., female   PCP: Lars Mage, MD         Medical Service: Internal Medicine Teaching Service         Attending Physician: Dr. Sid Falcon, MD    First Contact: Dr. Sharon Seller Pager: [phone]  Second Contact: Dr. Berline Lopes Pager: [phone]       After Hours (After 5p/  First Contact Pager: [phone]  weekends / holidays): Second Contact Pager: [phone]   Chief Complaint: SOB  History of Present Illness: Kathy Frank is a 43 yo F w/ a PMHx notable for CKD V s/p fistula placement on 10/14/2018, left AKA, HTN, GERD, diabetes who presents today for a three day history of progressively worsening dyspnea absent chest pain that is worse with laying flat.  She stated that she was in her usual state of health until she developed the shortness of breath at rest and with ambulation first noticing this approximately three days prior but as she does not walk it may have started sooner. Sitting up slightly improves her symptoms. She has been able to sleep lying down although with difficulty. She has had no changes in urination and states she is urinating her normal amount. She denies LE swelling.   She denied known sick contacts, denied fever, chills, nausea, chest pain, productive cough, but endorses mild sinus drainage. She denied having seen nephrology as an outpatient. She denies abdominal pain or nausea. Her last menstrual cycle was one month ago.   In the ED, a BNP was elevated to 1225, Hgb 7.0 WBC count of 9.8, BMP with potassium 4.7, CO2 16, Scr 5.66. CXR read as bilateral interstitial edema and small pleural effusions.   Social:  Social History   Tobacco Use  . Smoking status: Former Smoker    Packs/day: 0.10    Years: 10.00    Pack years: 1.00    Types: Cigarettes    Start date: 11/17/2013    Last attempt to quit: 09/09/2014    Years since quitting: 4.2  . Smokeless  tobacco: Never Used  Substance Use Topics  . Alcohol use: Yes    Comment: Occasionally.  . Drug use: Yes    Frequency: 4.0 times per week    Types: Marijuana   Family History:  Family History  Problem Relation Age of Onset  . Diabetes Mother   . Diabetes Brother   . Diabetes Daughter   . Diabetes Daughter   . Mental retardation Brother        died from PNA  . Diabetes Maternal Grandmother    Meds:  No outpatient medications have been marked as taking for the 11/24/18 encounter [facility] Encounter).   Allergies: Allergies as of 11/24/2018  . (No Known Allergies)   Past Medical History:  Diagnosis Date  . CKD (chronic kidney disease) stage 5, GFR less than 15 ml/min (HCC) 06/19/2016   06/30/16: Seen by Dr. Posey Pronto [Nephrology]. Suspect etiologies to include diabetes and hypertension though cannot exclude HIV-associated nephropathy. Advised her against from using excessive Goody powder.  . Contact dermatitis 04/21/2016   07/29/16: Skin biopsy performed by [facility] notable for acute spongiotic dermatitis consistent with contact dermatitis.  . Cyclical vomiting    THC induced???  . Depression 06/28/2006   Qualifier: Diagnosis of  By: Riccardo Dubin MD,  [place]    . Diabetes mellitus type 2 in obese ([place]) 06/28/1994  . DKA (diabetic ketoacidoses) ([place]) 12/2014  . Erosive esophagitis   . Esophageal reflux   . Essential hypertension 11/16/2013  . Gastroparesis    ? diabetic  . GERD (gastroesophageal reflux disease)   . Human immunodeficiency virus (HIV) disease ([place]) 04/23/2016  . Hypertension   . Moderate nonproliferative diabetic retinopathy of both eyes ([place]) 11/21/2014   11/14/14: Noted on retinal imaging; needs follow-up imaging in 6 months  05/22/16: Noted on retinal imaging again; needs follow-up imaging in 6 months  . Normocytic anemia   . TOBACCO USER 06/28/2006   Qualifier: Diagnosis of  By: Riccardo Dubin MD, Sherren Mocha    . Type II diabetes mellitus ([place]) 1994   diagnosed  around 1994   Review of Systems: A complete ROS was negative except as per HPI.   Physical Exam: Blood pressure (!) 160/74, pulse 90, temperature 98.4 F (36.9 C), temperature source Oral, resp. rate 20, height 5\' 5"  (1.651 m), weight 74.8 kg, last menstrual period 10/28/2018, SpO2 98 %. General: A/O x4, appears acutely ill, afebrile, nondiaphoretic HENT: Atraumatic, non nasal discharge noted Eyes: PEERL, EMO intact Cardio: tachycardic, no mrg's  Pulmonary: Prominent crackles bilaterally upper and lower lung fields Abdomen: Bowel sounds normal, soft, nontender  MSK: RLE nontender, nonedematous, LLE AKA Neuro: Alert, CNII-XII grossly intact, conversational Psych: Appropriate affect, not depressed in appearance, engages well  EKG: personally reviewed my interpretation is NSR, mild ST depression in the inferior leads slightly more prominent than prior  CXR: personally reviewed my interpretation is that there is mild bilateral interstitial edema  Assessment & Plan by Problem: Active Problems:   Renal failure (ARF), acute on chronic (HCC)  Assessment: Kathy Frank is a 43 yo F w/ a PMHx notable for CKD V s/p fistula placement on 10/14/2018, left AKA, HTN, GERD, diabetes who presents today for a three day history of progressively worsening dyspnea, worse with laying flat.   Plan: Dyspnea 2/2 renal failure, CKD V progression: Patient presents with progressively worsening renal failure with pulmonary edema and dyspnea. Given her lack of fever, chills, cough, anorexia or other symptoms related to a viral respiratory illness her presentation is deemed unlikely to be related to COVID-19. Her symptoms and chest x-ray are consistent with volume overload likely secondary to her renal failure. However, she does not have LE edema and cannot rule out acute heart failure with her additional elevated BNP and vascular congestion.  She has a AV fistula placed 3/06 and has been waiting for this to  mature before starting dialysis.   - lasix 80mg  BID - nephrology consulted   - TTE to rule out acute heart failure  - am RFP  AKA: Left lower extremity AKA revision 3/11.   - cont. Oxycodone-acetaminophen 5-325 q6h prn severe pain - will hold gabapentin due to renal dysfunction  Anemia: Chronic anemia 2/2 renal failure requiring multiple blood transfusions in the past. She also has a history of heavy menstrual bleeding for which she takes megace. She is not currently on her cycle.   - am CBC  - aranesp per nephrology   HIV: Well controlled  - cont. juluca - dolutegravir-rilpivirine 50-25mg  qd with meals   DMII: Takes Victoza 1.2 mg qd and lantus 5 mg qhs  - SSI sensitive - hemoglobin a1c  HTN: Blood pressure elevated to 216/98 at admission. She had not taken her blood pressure medications today. Given home labetalol 200 mg and  norvasc 10mg  qd in the ED with improvement in blood pressure. Will additionally receive lasix 80 mg bid.   - cont. Labetalol 200 mg  - cont. Home norvasc 10mg  qd  Diet: Renal, carb modified VTE: Heparin IVF: N/A Code: Full Dispo: Admit patient to Inpatient with expected length of stay greater than 2 midnights.  SignedMolli Hazard, DO 11/24/2018, 1:23 PM  Pager: [phone]

## 2018-11-24 NOTE — ED Triage Notes (Signed)
Lab tech  at bed side  Pt refusing Blood draw for Type and screen

## 2018-11-24 NOTE — Progress Notes (Addendum)
Patient refused Type and Screen, will hold blood until AM given her stable Hgb absent acute bleed and severe hypertension.   [name], MD [facility] Internal Medicine, PGY-2 Pager: [phone]

## 2018-11-24 NOTE — Consult Note (Addendum)
Hospital Consult    Reason for Consult: Evaluate for left second stage brachiobasilic fistula Referring Physician: Internal Medicine MRN #:  [id number]  History of Present Illness: This is a 43 y.o. female with multiple medical problems including stage V chronic kidney disease, diabetes, HIV, hypertension, chronic anemia that vascular surgery has been asked to evaluate for left second stage brachiobasilic fistula.  Patient presented to the ED this evening with progression of her stage V chronic kidney disease and is being admitted for aggressive diuresis.  In discussing with medicine team there are no immediate plans for dialysis and she does not need a catheter.  They do anticipate she will likely need dialysis within the next month or so.  Her presenting complaint in the ED was worsening dyspnea with pulmonary edema. Evaluation in the ED patient states her left first stage brachiobasilic fistula is working well.  This was placed on 10/14/18.  She can still feel a thrill in her fistula.  She says she has some numbness and tingling in the hand that is minimal and very manageable.  No weakness.  Past Medical History:  Diagnosis Date  . CKD (chronic kidney disease) stage 5, GFR less than 15 ml/min (HCC) 06/19/2016   06/30/16: Seen by Dr. Posey Pronto [Nephrology]. Suspect etiologies to include diabetes and hypertension though cannot exclude HIV-associated nephropathy. Advised her against from using excessive Goody powder.  . Contact dermatitis 04/21/2016   07/29/16: Skin biopsy performed by [facility] notable for acute spongiotic dermatitis consistent with contact dermatitis.  . Cyclical vomiting    THC induced???  . Depression 06/28/2006   Qualifier: Diagnosis of  By: Riccardo Dubin MD, [place]    . Diabetes mellitus type 2 in obese ([place]) 06/28/1994  . DKA (diabetic ketoacidoses) ([place]) 12/2014  . Erosive esophagitis   . Esophageal reflux   . Essential hypertension 11/16/2013  . Gastroparesis    ?  diabetic  . GERD (gastroesophageal reflux disease)   . Human immunodeficiency virus (HIV) disease ([place]) 04/23/2016  . Hypertension   . Moderate nonproliferative diabetic retinopathy of both eyes ([place]) 11/21/2014   11/14/14: Noted on retinal imaging; needs follow-up imaging in 6 months  05/22/16: Noted on retinal imaging again; needs follow-up imaging in 6 months  . Normocytic anemia   . TOBACCO USER 06/28/2006   Qualifier: Diagnosis of  By: Riccardo Dubin MD, Sherren Mocha    . Type II diabetes mellitus ([place]) 1994   diagnosed around 1994    Past Surgical History:  Procedure Laterality Date  . AMPUTATION Left 07/08/2018   Procedure: AMPUTATION FORTH RAY LEFT FOOT;  Surgeon: [name], MD;  Location: [place];  Service: Orthopedics;  Laterality: Left;  . AMPUTATION Left 08/09/2018   Procedure: Left Transmetatarsal Amputation;  Surgeon: [name], MD;  Location: [place];  Service: Orthopedics;  Laterality: Left;  . AMPUTATION Left 10/08/2018   Procedure: LEFT BELOW KNEE AMPUTATION;  Surgeon: [name], MD;  Location: [place];  Service: Orthopedics;  Laterality: Left;  . AMPUTATION Left 10/28/2018   Procedure: REVISION BELOW KNEE AMPUTATION;  Surgeon: [name], MD;  Location: [place];  Service: Orthopedics;  Laterality: Left;  . AV FISTULA PLACEMENT Left 10/14/2018   Procedure: Arteriovenous (Av) Fistula Creation Left Arm;  Surgeon: [name], MD;  Location: [place];  Service: Vascular;  Laterality: Left;  . LOWER EXTREMITY ANGIOGRAPHY N/A 07/05/2018   Procedure: LOWER EXTREMITY ANGIOGRAPHY;  Surgeon: Serafina Mitchell, MD;  Location: [facility];  Service: Cardiovascular;  Laterality: N/A;  . PERIPHERAL VASCULAR BALLOON ANGIOPLASTY Left 07/05/2018   Procedure: PERIPHERAL VASCULAR BALLOON ANGIOPLASTY;  Surgeon: Serafina Mitchell, MD;  Location: [facility];  Service: Cardiovascular;  Laterality: Left;  SFA  . STUMP REVISION Left 10/19/2018   Procedure: REVISION LEFT BELOW KNEE  AMPUTATION;  Surgeon: [name], MD;  Location: [place];  Service: Orthopedics;  Laterality: Left;  . TUBAL LIGATION  2002    No Known Allergies  Prior to Admission medications   Medication Sig Start Date End Date Taking? Authorizing Provider  amLODipine (NORVASC) 10 MG tablet Take 1 tablet (10 mg total) by mouth daily. 12/24/17 10/27/27  Rice, Resa Miner, MD  calcitRIOL (ROCALTROL) 0.25 MCG capsule Take 1 capsule (0.25 mcg total) by mouth daily. 10/17/18   [name], MD  calcium acetate (PHOSLO) 667 MG capsule Take 2 capsules (1,334 mg total) by mouth 3 (three) times daily with meals. 10/16/18   [name], MD  citalopram (CELEXA) 20 MG tablet Take 1 tablet (20 mg total) by mouth daily. 08/12/18 10/27/27  Carroll Sage, MD  Darbepoetin Alfa (ARANESP) 60 MCG/0.3ML SOSY injection Inject 0.3 mLs (60 mcg total) into the skin every Tuesday at 6 PM. 10/18/18   [name], MD  gabapentin (NEURONTIN) 300 MG capsule Take 1 capsule (300 mg total) by mouth 2 (two) times daily. 11/17/18   Rayburn, Neta Mends, PA-C  glucose blood (ACCU-CHEK GUIDE) test strip USE TO TEST BLOOD SUGAR 3 TIMES DAILY . Diagnosis code  E11.69, E66.9 07/09/17   Lars Mage, MD  Insulin Glargine (LANTUS) 100 UNIT/ML Solostar Pen Inject 5 Units into the skin at bedtime. 10/16/18   [name], MD  JULUCA 50-25 MG TABS TAKE 1 TABLET BY MOUTH DAILY. PLEASE TAKE WITH LARGEST MEAL OF THE DAY. 11/11/18 02/09/19  Lars Mage, MD  labetalol (NORMODYNE) 200 MG tablet Take 1 tablet (200 mg total) by mouth 2 (two) times daily. 10/21/18   [name], MD  liraglutide (VICTOZA) 18 MG/3ML SOPN Inject 0.2 mLs (1.2 mg total) into the skin daily. 02/04/18   Chundi, Verne Spurr, MD  megestrol (MEGACE) 40 MG tablet Take 1 tablet (40 mg total) by mouth daily. 10/21/18   [name], MD  methocarbamol (ROBAXIN) 500 MG tablet Take 1 tablet (500 mg total) by mouth every 6 (six) hours as needed for muscle  spasms. 10/21/18   [name], MD  nutrition supplement, JUVEN, (JUVEN) PACK Take 1 packet by mouth 2 (two) times daily between meals. 10/16/18   [name], MD  ondansetron (ZOFRAN) 4 MG tablet Take 1 tablet (4 mg total) by mouth daily as needed for nausea or vomiting. 08/19/18 08/19/19  Alphonzo Grieve, MD  oxyCODONE-acetaminophen (PERCOCET/ROXICET) 5-325 MG tablet Take 1 tablet by mouth every 6 (six) hours as needed for moderate pain or severe pain. 11/17/18   Rayburn, Neta Mends, PA-C  polyethylene glycol (MIRALAX / GLYCOLAX) packet Take 17 g by mouth daily. Patient taking differently: Take 17 g by mouth daily as needed for mild constipation.  08/12/18   Carroll Sage, MD  senna (SENOKOT) 8.6 MG TABS tablet Take 2 tablets (17.2 mg total) by mouth 2 (two) times daily. Patient taking differently: Take 2 tablets by mouth daily as needed for mild constipation.  08/12/18   Carroll Sage, MD  silver sulfADIAZINE (SILVADENE) 1 % cream Apply 1 application topically daily. 11/17/18   Rayburn, Neta Mends, PA-C  sodium bicarbonate 650 MG tablet Take 2 tablets (1,300  mg total) by mouth 2 (two) times daily. 10/16/18   [name], MD  SPS 15 GM/60ML suspension TAKE 60 MILLILITERS (15 G TOTAL) BY MOUTH DAILY. 11/11/18   Lars Mage, MD    Social History   Socioeconomic History  . Marital status: Single    Spouse name: Not on file  . Number of children: Not on file  . Years of education: 68  . Highest education level: Not on file  Occupational History    Employer: UNEMPLOYED  Social Needs  . Financial resource strain: Not on file  . Food insecurity:    Worry: Not on file    Inability: Not on file  . Transportation needs:    Medical: Not on file    Non-medical: Not on file  Tobacco Use  . Smoking status: Former Smoker    Packs/day: 0.10    Years: 10.00    Pack years: 1.00    Types: Cigarettes    Start date: 11/17/2013    Last attempt to quit: 09/09/2014    Years  since quitting: 4.2  . Smokeless tobacco: Never Used  Substance and Sexual Activity  . Alcohol use: Yes    Comment: Occasionally.  . Drug use: Yes    Frequency: 4.0 times per week    Types: Marijuana  . Sexual activity: Not on file  Lifestyle  . Physical activity:    Days per week: Not on file    Minutes per session: Not on file  . Stress: Not on file  Relationships  . Social connections:    Talks on phone: Not on file    Gets together: Not on file    Attends religious service: Not on file    Active member of club or organization: Not on file    Attends meetings of clubs or organizations: Not on file    Relationship status: Not on file  . Intimate partner violence:    Fear of current or ex partner: Not on file    Emotionally abused: Not on file    Physically abused: Not on file    Forced sexual activity: Not on file  Other Topics Concern  . Not on file  Social History Narrative  . Not on file     Family History  Problem Relation Age of Onset  . Diabetes Mother   . Diabetes Brother   . Diabetes Daughter   . Diabetes Daughter   . Mental retardation Brother        died from PNA  . Diabetes Maternal Grandmother     ROS: [x]  Positive   [ ]  Negative   [ ]  All sytems reviewed and are negative  Cardiovascular: []  chest pain/pressure []  palpitations []  SOB lying flat [x]  DOE []  pain in legs while walking []  pain in legs at rest []  pain in legs at night []  non-healing ulcers []  hx of DVT []  swelling in legs  Pulmonary: []  productive cough []  asthma/wheezing []  home O2  Neurologic: []  weakness in []  arms []  legs []  numbness in []  arms []  legs []  hx of CVA []  mini stroke [] difficulty speaking or slurred speech []  temporary loss of vision in one eye []  dizziness  Hematologic: []  hx of cancer []  bleeding problems []  problems with blood clotting easily  Endocrine:   []  diabetes []  thyroid disease  GI []  vomiting blood []  blood in stool  GU: []   CKD/renal failure []  HD--[]  M/W/F or []  T/T/S []  burning with urination []  blood  in urine  Psychiatric: []  anxiety []  depression  Musculoskeletal: []  arthritis []  joint pain  Integumentary: []  rashes []  ulcers  Constitutional: []  fever []  chills   Physical Examination  Vitals:   11/24/18 1430 11/24/18 1445  BP: (!) 144/75 (!) 155/84  Pulse:    Resp:    Temp:    SpO2:     Body mass index is 27.46 kg/m.  General:  Laying in Biomedical scientist in ED Gait: Not observed HENT: WNL, normocephalic Pulmonary: normal non-labored breathing Cardiac: regular, without  Murmurs, rubs or gallops Abdomen: soft, NT/ND, no masses Vascular Exam/Pulses: Left radial pulse palpable Good thrill in left brachiobasilic fistula and incisions well healed Extremities: Left BKA wrapped Musculoskeletal: no muscle wasting or atrophy  Neurologic: A&O X 3; Appropriate Affect ; SENSATION: normal; MOTOR FUNCTION:  moving all extremities equally. Speech is fluent/normal   CBC    Component Value Date/Time   WBC 9.8 11/24/2018 1010   RBC 2.43 (L) 11/24/2018 1010   HGB 7.0 (L) 11/24/2018 1010   HGB 9.8 (L) 07/18/2018 1133   HCT 22.9 (L) 11/24/2018 1010   HCT 19.3 (L) 10/09/2018 1405   PLT 184 11/24/2018 1010   PLT 417 07/18/2018 1133   MCV 94.2 11/24/2018 1010   MCV 89 07/18/2018 1133   MCH 28.8 11/24/2018 1010   MCHC 30.6 11/24/2018 1010   RDW 17.0 (H) 11/24/2018 1010   RDW 12.9 07/18/2018 1133   LYMPHSABS 1.5 11/24/2018 1010   LYMPHSABS 1.6 12/23/2017 1558   MONOABS 0.7 11/24/2018 1010   EOSABS 0.6 (H) 11/24/2018 1010   EOSABS 0.2 12/23/2017 1558   BASOSABS 0.1 11/24/2018 1010   BASOSABS 0.1 12/23/2017 1558    BMET    Component Value Date/Time   NA 140 11/24/2018 1045   NA 138 12/23/2017 1558   K 4.7 11/24/2018 1045   CL 114 (H) 11/24/2018 1045   CO2 16 (L) 11/24/2018 1045   GLUCOSE 223 (H) 11/24/2018 1045   BUN 41 (H) 11/24/2018 1045   BUN 28 (H) 12/23/2017 1558   CREATININE 5.66  (H) 11/24/2018 1045   CREATININE 1.68 (H) 05/27/2016 1018   CALCIUM 8.4 (L) 11/24/2018 1045   GFRNONAA 9 (L) 11/24/2018 1045   GFRNONAA 79 11/14/2014 1154   GFRAA 10 (L) 11/24/2018 1045   GFRAA >89 11/14/2014 1154    COAGS: Lab Results  Component Value Date   INR 1.02 08/11/2018   INR 1.19 07/02/2018     Non-Invasive Vascular Imaging:    pending   ASSESSMENT/PLAN: This is a 44 y.o. female with progression of her stage V chronic kidney disease that presents with volume overload, pulmonary edema and dyspnea.  Vascular surgery has been asked to evaluate for possible left second stage brachiobasilic fistula during this admission.  I have asked the medicine team to order a fistula duplex and we will evaluate for appropriate maturation of the fistula after the first stage and could tentatively look into next week for the second stage.  If she is discharged before next week we can arrange on an outpatient basis for next week.  No immediate needs for dialysis at this time and does not need a catheter right now.  [name], MD [facility] Office: [phone] Pager: [phone]

## 2018-11-25 ENCOUNTER — Inpatient Hospital Stay (HOSPITAL_COMMUNITY): Payer: Medicaid Other

## 2018-11-25 DIAGNOSIS — N185 Chronic kidney disease, stage 5: Secondary | ICD-10-CM

## 2018-11-25 DIAGNOSIS — N189 Chronic kidney disease, unspecified: Secondary | ICD-10-CM

## 2018-11-25 DIAGNOSIS — Z79899 Other long term (current) drug therapy: Secondary | ICD-10-CM

## 2018-11-25 DIAGNOSIS — I770 Arteriovenous fistula, acquired: Secondary | ICD-10-CM

## 2018-11-25 DIAGNOSIS — Z89612 Acquired absence of left leg above knee: Secondary | ICD-10-CM

## 2018-11-25 DIAGNOSIS — Z9181 History of falling: Secondary | ICD-10-CM

## 2018-11-25 DIAGNOSIS — E872 Acidosis: Secondary | ICD-10-CM

## 2018-11-25 DIAGNOSIS — K219 Gastro-esophageal reflux disease without esophagitis: Secondary | ICD-10-CM

## 2018-11-25 DIAGNOSIS — Z01810 Encounter for preprocedural cardiovascular examination: Secondary | ICD-10-CM

## 2018-11-25 DIAGNOSIS — D631 Anemia in chronic kidney disease: Secondary | ICD-10-CM

## 2018-11-25 DIAGNOSIS — N179 Acute kidney failure, unspecified: Principal | ICD-10-CM

## 2018-11-25 DIAGNOSIS — E1122 Type 2 diabetes mellitus with diabetic chronic kidney disease: Secondary | ICD-10-CM

## 2018-11-25 DIAGNOSIS — I120 Hypertensive chronic kidney disease with stage 5 chronic kidney disease or end stage renal disease: Secondary | ICD-10-CM

## 2018-11-25 LAB — GLUCOSE, CAPILLARY
Glucose-Capillary: 125 mg/dL — ABNORMAL HIGH (ref 70–99)
Glucose-Capillary: 107 mg/dL — ABNORMAL HIGH (ref 70–99)
Glucose-Capillary: 129 mg/dL — ABNORMAL HIGH (ref 70–99)
Glucose-Capillary: 97 mg/dL (ref 70–99)

## 2018-11-25 LAB — RENAL FUNCTION PANEL
Albumin: 2.8 g/dL — ABNORMAL LOW (ref 3.5–5.0)
Anion gap: 10 (ref 5–15)
BUN: 43 mg/dL — ABNORMAL HIGH (ref 6–20)
CO2: 14 mmol/L — ABNORMAL LOW (ref 22–32)
Calcium: 8.2 mg/dL — ABNORMAL LOW (ref 8.9–10.3)
Chloride: 112 mmol/L — ABNORMAL HIGH (ref 98–111)
Creatinine, Ser: 5.71 mg/dL — ABNORMAL HIGH (ref 0.44–1.00)
GFR calc Af Amer: 10 mL/min — ABNORMAL LOW (ref >60)
GFR calc non Af Amer: 8 mL/min — ABNORMAL LOW (ref >60)
Glucose, Bld: 127 mg/dL — ABNORMAL HIGH (ref 70–99)
Phosphorus: 4.4 mg/dL (ref 2.5–4.6)
Potassium: 5 mmol/L (ref 3.5–5.1)
Sodium: 136 mmol/L (ref 135–145)

## 2018-11-25 LAB — HEMOGLOBIN AND HEMATOCRIT, BLOOD
HCT: 22.7 % — ABNORMAL LOW (ref 36.0–46.0)
Hemoglobin: 7.3 g/dL — ABNORMAL LOW (ref 12.0–15.0)

## 2018-11-25 LAB — IRON AND TIBC
Iron: 47 ug/dL (ref 28–170)
Saturation Ratios: 22 % (ref 10.4–31.8)
TIBC: 210 ug/dL — ABNORMAL LOW (ref 250–450)
UIBC: 163 ug/dL

## 2018-11-25 LAB — CBC
HCT: 20.2 % — ABNORMAL LOW (ref 36.0–46.0)
Hemoglobin: 6.6 g/dL — CL (ref 12.0–15.0)
MCH: 29.7 pg (ref 26.0–34.0)
MCHC: 32.7 g/dL (ref 30.0–36.0)
MCV: 91 fL (ref 80.0–100.0)
Platelets: 180 10*3/uL (ref 150–400)
RBC: 2.22 MIL/uL — ABNORMAL LOW (ref 3.87–5.11)
RDW: 17 % — ABNORMAL HIGH (ref 11.5–15.5)
WBC: 6.6 10*3/uL (ref 4.0–10.5)
nRBC: 0 % (ref 0.0–0.2)

## 2018-11-25 LAB — FERRITIN: Ferritin: 265 ng/mL (ref 11–307)

## 2018-11-25 LAB — PREPARE RBC (CROSSMATCH)

## 2018-11-25 LAB — MRSA PCR SCREENING: MRSA by PCR: NEGATIVE

## 2018-11-25 MED ORDER — LABETALOL HCL 200 MG PO TABS
300.0000 mg | ORAL_TABLET | Freq: Two times a day (BID) | ORAL | Status: DC
  Administered 2018-11-25 – 2018-11-26 (×2): 300 mg via ORAL
  Filled 2018-11-25 (×4): qty 1

## 2018-11-25 MED ORDER — SODIUM CHLORIDE 0.9 % IV SOLN
250.0000 mg | Freq: Once | INTRAVENOUS | Status: AC
  Administered 2018-11-25: 250 mg via INTRAVENOUS
  Filled 2018-11-25: qty 20

## 2018-11-25 MED ORDER — SODIUM CHLORIDE 0.9% IV SOLUTION
Freq: Once | INTRAVENOUS | Status: AC
  Administered 2018-11-25: 06:00:00 via INTRAVENOUS

## 2018-11-25 MED ORDER — DARBEPOETIN ALFA 150 MCG/0.3ML IJ SOSY
150.0000 ug | PREFILLED_SYRINGE | INTRAMUSCULAR | Status: DC
  Administered 2018-11-25: 150 ug via SUBCUTANEOUS
  Filled 2018-11-25: qty 0.3

## 2018-11-25 MED ORDER — SODIUM CHLORIDE 0.9% IV SOLUTION
Freq: Once | INTRAVENOUS | Status: AC
  Administered 2018-11-25: 08:00:00 via INTRAVENOUS

## 2018-11-25 NOTE — Progress Notes (Signed)
Left brachial-basilic arteriovenous fistula duplex completed. Refer to "CV Proc" under chart review to view preliminary results.  11/25/2018 11:22 AM Maudry Mayhew, MHA, RVT, RDCS, RDMS

## 2018-11-25 NOTE — Progress Notes (Signed)
CRITICAL VALUE ALERT  Critical Value:  Hgb 6.6  Date & Time Notied:  11/25/18 0430  Provider Notified: Hilbert Bible, NP  Orders Received/Actions taken: Awaiting response.  Schorr, NP also notified of last Hgb 7 yesterday and patient refused type and screen yesterday.

## 2018-11-25 NOTE — Progress Notes (Signed)
Patient ID: Kathy Frank, female   DOB: [birth year], 43 y.o.   MRN: [id number] [facility] Progress Note   Assessment/ Plan:   1. Acute kidney Injury on chronic kidney disease stage V: With underlying chronic kidney disease from hypertension and diabetes and current presentation likely hemodynamically mediated in the setting of what appears to be volume overload/diastolic CHF exacerbation.  She does not have any acute indications for dialysis at this time and renal function slightly lower as expected with initiation of diuretic therapy.  Continue to manage volume status with diuretics and ongoing medical management metabolic acidosis. 2.  Acute pulmonary edema/CHF exacerbation: Without edema or evidence of volume excess in the past and therefore not on diuretic therapy prior to admission. 3.  Hypertension: Blood pressures elevated on furosemide, amlodipine and labetalol.  Discussed the importance of adherence-monitor blood pressure trend for need to uptitrate labetalol. 4.  Anemia of chronic kidney disease: Low iron saturation, will begin series of intravenous iron as well as ESA.  No overt indication and without indications for PRBC transfusion. 5.  History of HIV infection: Restarted on antiretroviral therapy. 6.  Non-anion gap metabolic acidosis: Continue oral sodium bicarbonate supplement,  Subjective:   Reports some improvement of shortness of breath overnight   Objective:   BP (!) 189/89 (BP Location: Right Arm)   Pulse 93   Temp 98.4 F (36.9 C) (Oral)   Resp 16   Ht 5\' 5"  (1.651 m)   Wt 73.7 kg   LMP 10/28/2018   SpO2 100%   BMI 27.02 kg/m   Intake/Output Summary (Last 24 hours) at 11/25/2018 1128 Last data filed at 11/25/2018 0948 Gross per 24 hour  Intake 828 ml  Output 500 ml  Net 328 ml   Weight change:   Physical Exam: Gen: Comfortably resting in bed CVS: Pulse regular rhythm, normal rate, S1 and S2 normal Resp: Diminished breath sounds right base, no  distinct rales or rhonchi Abd: Soft, obese, nontender Ext: Status post left AKA, trace right ankle edema.  Status post first stage left BBF with palpable thrill  Imaging: Dg Chest Port 1 View  Result Date: 11/24/2018 CLINICAL DATA:  Short of breath cough 3 days EXAM: PORTABLE CHEST 1 VIEW COMPARISON:  07/02/2018 FINDINGS: Cardiac enlargement with vascular congestion and mild interstitial edema. Small bilateral pleural effusions and mild bibasilar atelectasis. IMPRESSION: Mild fluid overload with interstitial edema and small bilateral pleural effusions. Electronically Signed   By: Franchot Gallo M.D.   On: 11/24/2018 10:57    Labs: BMET Recent Labs  Lab 11/24/18 1045 11/25/18 0309  NA 140 136  K 4.7 5.0  CL 114* 112*  CO2 16* 14*  GLUCOSE 223* 127*  BUN 41* 43*  CREATININE 5.66* 5.71*  CALCIUM 8.4* 8.2*  PHOS  --  4.4   CBC Recent Labs  Lab 11/24/18 1010 11/25/18 0309  WBC 9.8 6.6  NEUTROABS 7.0  --   HGB 7.0* 6.6*  HCT 22.9* 20.2*  MCV 94.2 91.0  PLT 184 180    Medications:    . amLODipine  10 mg Oral Daily  . calcitRIOL  0.25 mcg Oral Daily  . citalopram  20 mg Oral Daily  . dolutegravir  50 mg Oral Q breakfast  . furosemide  80 mg Intravenous BID  . heparin  5,000 Units Subcutaneous Q8H  . insulin aspart  0-9 Units Subcutaneous TID WC  . labetalol  200 mg Oral BID  . rilpivirine  25 mg Oral Q  breakfast  . sodium bicarbonate  1,300 mg Oral BID   Elmarie Shiley, MD 11/25/2018, 11:28 AM

## 2018-11-25 NOTE — Progress Notes (Signed)
   Subjective: Ms. Torain states she feels tired this morning. She is frustrated that she has to wait on assistance to get out of bed to use the bathroom. We discussed this due to previous injures to her stump with falling, and she stated she understood. Feels her breathing is a little better. Had an episode of sharp chest pain earlier this morning that resolved spontaneously.   Objective:  Vital signs in last 24 hours: Vitals:   11/25/18 0344 11/25/18 0500 11/25/18 0557 11/25/18 0620  BP: (!) 159/84  (!) 149/77 (!) 150/98  Pulse: 88  88 87  Resp:   18 16  Temp: 98.7 F (37.1 C)  98.6 F (37 C)   TempSrc: Oral  Oral   SpO2: 100%  99% 100%  Weight:  73.7 kg    Height:       Constitution: NAD, supine in bed  HENT: AT/[place] Cardio: RRR, no m/r/g  Respiratory: right sided crackles with wheezing or rales, left CTA, non-labored breathing Abdominal: NTTP, non-distended, soft  MSK: LLE AKA without erythema or oozing, stitches intact; RLE without edema  Neuro: a&o, quiet affect Skin: c/d/i   Assessment/Plan:  Active Problems:   Renal failure (ARF), acute on chronic (HCC)  Kayana Thoen Favaro is a 43 yo F w/ a PMHx notable for CKD V s/p fistula placement on 10/14/2018, left AKA, HTN, GERD, diabetes who presents today for a three day history of progressively worsening dyspnea, worse with laying flat.   Vascular Conegestion 2/2 renal failure CKD V Non-anion gap metabolic acidosis  Creatinine slightly improved today. Lung sounds improved as well. Some Uop unmeasured and discussed with her the importance of keeping track of this, but she states she is urinating frequently. She received 60 mg IV lasix in the afternoon yesterday and 80 mg in the evening.  Seen by vascular yesterday to discuss 2nd stage of her AV Fistula. Vascular duplex US done to assess maturation of 1st stage of fistula. Vascular states if she is still admitted next week and US shows maturation, they may be able to consider 2nd  staging at this time, otherwise it will be done in the outpatient setting.   - cont. Lasix 80 mg bid - nephrology consulted, appreciate recommmendations - vascular consulted - am RFP - echo pending  - NaHCO3 increased to 1300 mg bid po per nephro  Chronic Normocytic Anemia 2/2 CKD Transfused 1U for Hgb 6.6 this morning. Repeat hemoglobin 7.3. Her baseline is around 7-7.5.   - monitor hemoglobin  - aranesp per nephrology   DMII Glucose controlled  - cont. ssi sensitive   HTN Blood pressure continues to be elevated this morning and afternoon with diuresing.   - increase labetalol 200 mg bid to 300 mg bid  - cont norvasc 10mg  qd  - lasix 80 mg bid  VTE: heparin IVF: none Diet: renal/carb modified Code: full   Dispo: Anticipated discharge in approximately 2-3 days.   [name], DO 11/25/2018, 6:21 AM Pager: [phone]

## 2018-11-25 NOTE — Progress Notes (Signed)
  Date: 11/25/2018  Patient name: Kathy Frank  Medical record number: [id number]  Date of birth: 1976/02/05   I have seen and evaluated this patient and I have discussed the plan of care with the house staff. Please see Dr. Aurelio Jew note for complete details. I concur with her findings and plan.    Sid Falcon, MD 11/25/2018, 8:06 PM

## 2018-11-26 ENCOUNTER — Inpatient Hospital Stay (HOSPITAL_COMMUNITY): Payer: Medicaid Other

## 2018-11-26 DIAGNOSIS — I160 Hypertensive urgency: Secondary | ICD-10-CM

## 2018-11-26 DIAGNOSIS — R0600 Dyspnea, unspecified: Secondary | ICD-10-CM

## 2018-11-26 DIAGNOSIS — Z9889 Other specified postprocedural states: Secondary | ICD-10-CM

## 2018-11-26 DIAGNOSIS — E8770 Fluid overload, unspecified: Secondary | ICD-10-CM

## 2018-11-26 DIAGNOSIS — R0609 Other forms of dyspnea: Secondary | ICD-10-CM

## 2018-11-26 LAB — TYPE AND SCREEN
ABO/RH(D): B POS
Antibody Screen: NEGATIVE
Unit division: 0

## 2018-11-26 LAB — RENAL FUNCTION PANEL
Albumin: 2.8 g/dL — ABNORMAL LOW (ref 3.5–5.0)
Anion gap: 12 (ref 5–15)
BUN: 42 mg/dL — ABNORMAL HIGH (ref 6–20)
CO2: 18 mmol/L — ABNORMAL LOW (ref 22–32)
Calcium: 8.5 mg/dL — ABNORMAL LOW (ref 8.9–10.3)
Chloride: 108 mmol/L (ref 98–111)
Creatinine, Ser: 6.19 mg/dL — ABNORMAL HIGH (ref 0.44–1.00)
GFR calc Af Amer: 9 mL/min — ABNORMAL LOW (ref >60)
GFR calc non Af Amer: 8 mL/min — ABNORMAL LOW (ref >60)
Glucose, Bld: 100 mg/dL — ABNORMAL HIGH (ref 70–99)
Phosphorus: 4.6 mg/dL (ref 2.5–4.6)
Potassium: 4.9 mmol/L (ref 3.5–5.1)
Sodium: 138 mmol/L (ref 135–145)

## 2018-11-26 LAB — BPAM RBC
Blood Product Expiration Date: 202004232359
ISSUE DATE / TIME: 202004170553
Unit Type and Rh: 5100

## 2018-11-26 LAB — ECHOCARDIOGRAM LIMITED
Height: 65 in
Weight: 2638.47 oz

## 2018-11-26 LAB — PTH, INTACT AND CALCIUM
Calcium, Total (PTH): 8.5 mg/dL — ABNORMAL LOW (ref 8.7–10.2)
PTH: 210 pg/mL — ABNORMAL HIGH (ref 15–65)

## 2018-11-26 LAB — GLUCOSE, CAPILLARY
Glucose-Capillary: 92 mg/dL (ref 70–99)
Glucose-Capillary: 134 mg/dL — ABNORMAL HIGH (ref 70–99)

## 2018-11-26 LAB — CBC
HCT: 23.2 % — ABNORMAL LOW (ref 36.0–46.0)
Hemoglobin: 7.3 g/dL — ABNORMAL LOW (ref 12.0–15.0)
MCH: 27.9 pg (ref 26.0–34.0)
MCHC: 31.5 g/dL (ref 30.0–36.0)
MCV: 88.5 fL (ref 80.0–100.0)
Platelets: 177 10*3/uL (ref 150–400)
RBC: 2.62 MIL/uL — ABNORMAL LOW (ref 3.87–5.11)
RDW: 15.9 % — ABNORMAL HIGH (ref 11.5–15.5)
WBC: 5.7 10*3/uL (ref 4.0–10.5)
nRBC: 0 % (ref 0.0–0.2)

## 2018-11-26 LAB — VITAMIN D 25 HYDROXY (VIT D DEFICIENCY, FRACTURES): Vit D, 25-Hydroxy: 4.6 ng/mL — ABNORMAL LOW (ref 30.0–100.0)

## 2018-11-26 MED ORDER — FUROSEMIDE 40 MG PO TABS: 40.0000 mg | ORAL_TABLET | Freq: Two times a day (BID) | ORAL | Status: DC

## 2018-11-26 MED ORDER — LABETALOL HCL 300 MG PO TABS: 300.0000 mg | ORAL_TABLET | Freq: Two times a day (BID) | ORAL | 0 refills | Status: DC

## 2018-11-26 MED ORDER — FUROSEMIDE 40 MG PO TABS: 40.0000 mg | ORAL_TABLET | Freq: Two times a day (BID) | ORAL | 0 refills | Status: DC

## 2018-11-26 MED FILL — JULUCA 50-25 MG TAB: 50-25 | 30 days supply | Qty: 30 | Fill #0

## 2018-11-26 MED FILL — FUROSEMIDE 40 MG TAB: 40 | 30 days supply | Qty: 60 | Fill #0

## 2018-11-26 MED FILL — LABETALOL HCL 300 MG TABLET: 300 | 30 days supply | Qty: 60 | Fill #0

## 2018-11-26 MED FILL — AMLODIPINE BESYLATE 10 MG T: 10 | 30 days supply | Qty: 30 | Fill #0

## 2018-11-26 NOTE — Progress Notes (Signed)
Patient ID: Kathy Frank, female   DOB: 08-Dec-1975, 43 y.o.   MRN: [id number] [facility] Progress Note   Assessment/ Plan:   1. Acute kidney Injury on chronic kidney disease stage V: With underlying chronic kidney disease from hypertension and diabetes and current presentation likely hemodynamically mediated in the setting of what appears to be volume overload/diastolic CHF exacerbation.  Responding well to oral diuretics and without indications for initiation of dialysis.  Plans in place for second stage brachiobasilic fistula next week. 2.  Acute pulmonary edema/CHF exacerbation: Without edema or evidence of volume excess in the past and therefore not on diuretic therapy prior to admission.  Responding well to furosemide 40 mg twice a day with improvement of her shortness of breath. 3.  Hypertension: Blood pressures elevated on furosemide, amlodipine and labetalol.  Discussed the importance of adherence-monitor blood pressure trend for need to uptitrate labetalol. 4.  Anemia of chronic kidney disease: Low iron saturation, will begin series of intravenous iron as well as ESA.  No overt indication and without indications for PRBC transfusion. 5.  History of HIV infection: Restarted on antiretroviral therapy. 6.  Non-anion gap metabolic acidosis: Improving with oral sodium bicarbonate supplementation  Subjective:   Course to be feeling better this morning and denies any chest pain, shortness of breath better.   Objective:   BP (!) 160/75 (BP Location: Right Arm)   Pulse 89   Temp 99.1 F (37.3 C) (Oral)   Resp 16   Ht 5\' 5"  (1.651 m)   Wt 74.8 kg   LMP 10/28/2018   SpO2 97%   BMI 27.44 kg/m   Intake/Output Summary (Last 24 hours) at 11/26/2018 1034 Last data filed at 11/26/2018 0847 Gross per 24 hour  Intake 419.65 ml  Output 1000 ml  Net -580.35 ml   Weight change: -0.043 kg  Physical Exam: Gen: Comfortably resting in bed CVS: Pulse regular rhythm, normal rate, S1  and S2 normal Resp: Diminished breath sounds right base, no distinct rales or rhonchi Abd: Soft, obese, nontender Ext: Status post left AKA, trace right ankle edema.  Status post first stage left BBF with palpable thrill  Imaging: Dg Chest Port 1 View  Result Date: 11/24/2018 CLINICAL DATA:  Short of breath cough 3 days EXAM: PORTABLE CHEST 1 VIEW COMPARISON:  07/02/2018 FINDINGS: Cardiac enlargement with vascular congestion and mild interstitial edema. Small bilateral pleural effusions and mild bibasilar atelectasis. IMPRESSION: Mild fluid overload with interstitial edema and small bilateral pleural effusions. Electronically Signed   By: Franchot Gallo M.D.   On: 11/24/2018 10:57   Vas US Duplex Dialysis Access (avf, Avg)  Result Date: 11/26/2018 DIALYSIS ACCESS Reason for Exam: Routine follow up. Access Site: Left Upper Extremity. Access Type: Brachial-basilic AVF.  Examination Guidelines: A complete evaluation includes B-mode imaging, spectral Doppler, color Doppler, and power Doppler as needed of all accessible portions of each vessel. Unilateral testing is considered an integral part of a complete examination. Limited examinations for reoccurring indications may be performed as noted.  Findings: +--------------------+----------+-----------------+--------+ AVF                 PSV (cm/s)Flow Vol (mL/min)Comments +--------------------+----------+-----------------+--------+ Native artery inflow   207          1392                +--------------------+----------+-----------------+--------+ AVF Anastomosis        856                              +--------------------+----------+-----------------+--------+  +------------+----------+-------------+----------+----------------+  OUTFLOW VEINPSV (cm/s)Diameter (cm)Depth (cm)    Describe     +------------+----------+-------------+----------+----------------+ Mid UA         187        0.60        0.89                     +------------+----------+-------------+----------+----------------+ Dist UA        341        0.47        0.78   competing branch +------------+----------+-------------+----------+----------------+   Summary: Patent arteriovenous fistula. Left basilic vein branch noted in the distal upper arm. *See table(s) above for measurements and observations.  Diagnosing physician: Servando Snare MD Electronically signed by Servando Snare MD on 11/26/2018 at 10:27:48 AM.   --------------------------------------------------------------------------------   Final     Labs: BMET Recent Labs  Lab 11/24/18 1045 11/25/18 0309 11/26/18 0327  NA 140 136 138  K 4.7 5.0 4.9  CL 114* 112* 108  CO2 16* 14* 18*  GLUCOSE 223* 127* 100*  BUN 41* 43* 42*  CREATININE 5.66* 5.71* 6.19*  CALCIUM 8.4* 8.2*  8.5* 8.5*  PHOS  --  4.4 4.6   CBC Recent Labs  Lab 11/24/18 1010 11/25/18 0309 11/25/18 1154 11/26/18 0327  WBC 9.8 6.6  --  5.7  NEUTROABS 7.0  --   --   --   HGB 7.0* 6.6* 7.3* 7.3*  HCT 22.9* 20.2* 22.7* 23.2*  MCV 94.2 91.0  --  88.5  PLT 184 180  --  177    Medications:    . amLODipine  10 mg Oral Daily  . calcitRIOL  0.25 mcg Oral Daily  . citalopram  20 mg Oral Daily  . darbepoetin (ARANESP) injection - NON-DIALYSIS  150 mcg Subcutaneous Q Fri-1800  . dolutegravir  50 mg Oral Q breakfast  . furosemide  40 mg Oral BID  . heparin  5,000 Units Subcutaneous Q8H  . insulin aspart  0-9 Units Subcutaneous TID WC  . labetalol  300 mg Oral BID  . rilpivirine  25 mg Oral Q breakfast  . sodium bicarbonate  1,300 mg Oral BID   Elmarie Shiley, MD 11/26/2018, 10:34 AM

## 2018-11-26 NOTE — Progress Notes (Addendum)
   Subjective: Ms. Mccoll breathing has improved, she denies cough or SOB. States her urine output has been normal for her. Denies abdominal pain, nausea or chest pain. She has not been out of bed except to urinate. She states she has been feeling tired.  Objective:  Vital signs in last 24 hours: Vitals:   11/25/18 1512 11/25/18 1940 11/26/18 0007 11/26/18 0406  BP: (!) 151/78 140/80 (!) 149/72 (!) 160/75  Pulse: 83 85 82 89  Resp: 16     Temp: 99.1 F (37.3 C) 98.2 F (36.8 C) 99.7 F (37.6 C) 99.1 F (37.3 C)  TempSrc: Oral Oral Oral Oral  SpO2: 100% 97% 99% 97%  Weight:    74.8 kg  Height:       General: awake, lying in bed in NAD Cardio: RRR; no m/r/g Respiratory: Tracheal breath sounds, otherwise CTA  MSK: LLE AKA without erythema, oozing, stitches intact, RLE without edema  Neuro: quiet affect, oriented but appears tired Skin: c/d/i   Assessment/Plan:  Active Problems:   Hypertension   Renal failure (ARF), acute on chronic (HCC)   Anemia due to chronic kidney disease  JAMISYN LANGER is a 43 yo F w/ a PMHx notable for CKD V s/p fistula placement on 10/14/2018, left AKA, HTN, GERD, diabetes who presents today for a three day history of progressively worsening dyspnea, worse with laying flat.   Vascular Conegestion 2/2 renal failure CKD V Non-anion gap metabolic acidosis  Bump in creatinine today and she states her Uop has not increased but has remained normal throughout admission. Lung sounds are improved and she continues to not have LE edema. Uop continues to not be measured. She is unable to start dialysis as she needs 2nd stage of left fistula. Vasc duplex US shows patent AV fistula.   - cont. Lasix 80 mg IV - switch to po lasix in the afternoon - nephrology consulted, appreciate recommmendations - vascular consulted - am RFP - echo pending  - cont. 1300 mg NaHCO3 bid po per nephro - PT/OT  Chronic Normocytic Anemia 2/2 CKD Transfused 1U for Hgb 6.6 at  admission. Hemoglobin now stable. Her baseline is around 7-7.5.   - monitor hemoglobin  - aranesp per nephrology   DMII Glucose controlled  - cont. ssi sensitive   HTN Blood pressure continues to be elevated with diuresis and increase in labetalol to 300 mg yesterday.   - cont. Labetalol 300 mg - cont norvasc 10mg  qd  - cont. Am lasix 80 mg IV in the am, switch to po lasix 40 mg bid in afternoon due to increase in creatinine.   VTE: lovenox  IVF: none Diet: renal/carb modified Code: full   Dispo: Anticipated discharge in approximately 1-2 days  [name], DO 11/26/2018, 6:35 AM Pager: [phone]

## 2018-11-26 NOTE — Progress Notes (Signed)
Kathy Frank to be D/C'd home per MD order. Discussed with the patient and all questions fully answered. VVS, Skin clean, dry and intact without evidence of skin break down, no evidence of skin tears noted. BKA wrapped in ACE bandage. IV catheter discontinued intact. Site without signs and symptoms of complications. Dressing and pressure applied.  An After Visit Summary was printed and given to the patient.  Patient escorted via [place], and D/C home via private auto.  [name]  11/26/2018 2:22 PM

## 2018-11-26 NOTE — Progress Notes (Signed)
  Echocardiogram 2D Echocardiogram has been performed.  Johny Chess 11/26/2018, 9:24 AM

## 2018-11-28 ENCOUNTER — Other Ambulatory Visit: Payer: Self-pay | Admitting: *Deleted

## 2018-11-28 DIAGNOSIS — N179 Acute kidney failure, unspecified: Secondary | ICD-10-CM

## 2018-11-28 DIAGNOSIS — N184 Chronic kidney disease, stage 4 (severe): Secondary | ICD-10-CM

## 2018-11-29 ENCOUNTER — Encounter: Payer: Medicaid Other | Admitting: Vascular Surgery

## 2018-11-29 ENCOUNTER — Telehealth (INDEPENDENT_AMBULATORY_CARE_PROVIDER_SITE_OTHER): Payer: Self-pay | Admitting: Orthopedic Surgery

## 2018-11-29 ENCOUNTER — Encounter (HOSPITAL_COMMUNITY): Payer: Medicaid Other

## 2018-11-29 ENCOUNTER — Inpatient Hospital Stay (HOSPITAL_COMMUNITY): Admission: RE | Admit: 2018-11-29 | Payer: Medicaid Other | Source: Ambulatory Visit

## 2018-11-29 NOTE — Telephone Encounter (Signed)
Thank you, Done.

## 2018-11-29 NOTE — Telephone Encounter (Signed)
Pt called, she lost her order for stump shrinker, can a new one be written and be faxed to [employer]. If questions callback, [phone]

## 2018-11-30 ENCOUNTER — Other Ambulatory Visit: Payer: Self-pay

## 2018-11-30 ENCOUNTER — Ambulatory Visit: Payer: Medicaid Other | Admitting: Internal Medicine

## 2018-11-30 ENCOUNTER — Ambulatory Visit (INDEPENDENT_AMBULATORY_CARE_PROVIDER_SITE_OTHER): Payer: Self-pay | Admitting: Family

## 2018-11-30 DIAGNOSIS — E1169 Type 2 diabetes mellitus with other specified complication: Secondary | ICD-10-CM

## 2018-11-30 DIAGNOSIS — E669 Obesity, unspecified: Principal | ICD-10-CM

## 2018-11-30 NOTE — Progress Notes (Signed)
Medicine attending: Multiple attempts to contact patient by Dr Tarri Abernethy unsuccessful.

## 2018-11-30 NOTE — Progress Notes (Signed)
Attempted to call patient x5 throughout the past morning. No answer.

## 2018-12-06 ENCOUNTER — Ambulatory Visit (INDEPENDENT_AMBULATORY_CARE_PROVIDER_SITE_OTHER): Payer: Self-pay | Admitting: Orthopedic Surgery

## 2018-12-12 ENCOUNTER — Ambulatory Visit (INDEPENDENT_AMBULATORY_CARE_PROVIDER_SITE_OTHER): Payer: Medicaid Other | Admitting: Physician Assistant

## 2018-12-12 ENCOUNTER — Other Ambulatory Visit: Payer: Self-pay

## 2018-12-12 ENCOUNTER — Encounter: Payer: Self-pay | Admitting: Orthopedic Surgery

## 2018-12-12 ENCOUNTER — Ambulatory Visit (INDEPENDENT_AMBULATORY_CARE_PROVIDER_SITE_OTHER): Payer: Self-pay | Admitting: Physician Assistant

## 2018-12-12 VITALS — Ht 65.0 in | Wt 164.9 lb

## 2018-12-12 DIAGNOSIS — Z89512 Acquired absence of left leg below knee: Secondary | ICD-10-CM

## 2018-12-12 DIAGNOSIS — T8781 Dehiscence of amputation stump: Secondary | ICD-10-CM

## 2018-12-12 DIAGNOSIS — N183 Chronic kidney disease, stage 3 unspecified: Secondary | ICD-10-CM

## 2018-12-12 DIAGNOSIS — E1142 Type 2 diabetes mellitus with diabetic polyneuropathy: Secondary | ICD-10-CM

## 2018-12-12 DIAGNOSIS — Z794 Long term (current) use of insulin: Secondary | ICD-10-CM

## 2018-12-12 NOTE — Progress Notes (Signed)
Office Visit Note   Patient: Kathy Frank           Date of Birth: [birth year]           MRN: [id number] Visit Date: 12/12/2018              Requested by: Lars Mage, MD [address] [place], [place] [address] PCP: Lars Mage, MD  Chief Complaint  Patient presents with  . Left Leg - Routine Post Op    10/28/2018 left leg BKA      HPI: The patient is a 43 yo woman who is seen for post operative follow up following left transtibial amputation on 10/28/2018. She reports drainage and odor from the area for the past couple of weeks. She has been using some silvadene cream to the area.  She has had access placed, AV fistula,  for hemodialysis and reports she has not yet started dialysis, but she has an appointment to see the renal physician next week.    Assessment & Plan: Visit Diagnoses:  1. Dehiscence of amputation stump ([place])   2. S/P below knee amputation, left ([place])   3. Type 2 diabetes mellitus with diabetic polyneuropathy, with long-term current use of insulin ([place])   4. CKD (chronic kidney disease) stage 3, GFR 30-59 ml/min (HCC)     Plan: Counseled the patient she will require an revision to above the knee amputation and the patient is agreeable to proceed later this week.  Will contact renal to see during this admission.  Follow up in office following admit for revision to left above the knee amputation.    Follow-Up Instructions: Return in about 2 weeks (around 12/26/2018).   Ortho Exam  Patient is alert, oriented, no adenopathy, well-dressed, normal affect, normal respiratory effort. The left transtibial amputation site has dehisced and has foul smelling drainage.  There is significant necrotic tissue and the area tracks deeply likely to the tibia.  The sutures are under tension.  She is tender to palpation.  Imaging: No results found. No images are attached to the encounter.  Labs: Lab Results  Component Value Date   HGBA1C 6.2 (H) 11/24/2018   HGBA1C  6.5 (H) 08/06/2018   HGBA1C 7.9 (A) 02/04/2018   ESRSEDRATE 21 08/06/2018   ESRSEDRATE 85 (H) 02/02/2017   CRP <0.8 10/07/2018   CRP <0.8 08/06/2018   CRP 42.4 (H) 02/02/2017   REPTSTATUS 10/12/2018 FINAL 10/07/2018   REPTSTATUS 10/09/2018 FINAL 10/07/2018   GRAMSTAIN  10/07/2018    NO WBC SEEN RARE GRAM POSITIVE COCCI IN PAIRS FEW GRAM POSITIVE RODS    CULT  10/07/2018    NO GROWTH 5 DAYS Performed at [facility], [place] [address], [place], [place] [address]    CULT  10/07/2018    NORMAL SKIN FLORA Performed at [facility], [place] [address] [place], [place] [address]    [place] 07/08/2018     Lab Results  Component Value Date   ALBUMIN 2.8 (L) 11/26/2018   ALBUMIN 2.8 (L) 11/25/2018   ALBUMIN 2.3 (L) 10/30/2018    Body mass index is 27.44 kg/m.  Orders:  No orders of the defined types were placed in this encounter.  No orders of the defined types were placed in this encounter.    Procedures: No procedures performed  Clinical Data: No additional findings.  ROS:  All other systems negative, except as noted in the HPI. Review of Systems  Objective: Vital Signs: Ht  5\' 5"  (1.651 m)   Wt 164 lb 14.4 oz (74.8 kg)   BMI 27.44 kg/m   Specialty Comments:  No specialty comments available.  PMFS History: Patient Active Problem List   Diagnosis Date Noted  . Anemia due to chronic kidney disease   . Renal failure (ARF), acute on chronic ([place]) 11/24/2018  . Fall due to stumbling   . Below-knee amputation of left lower extremity ([place])   . BKA stump complication ([place]) 29/79/8921  . Osteomyelitis ([place]) 10/07/2018  . Dehiscence of amputation stump ([place])   . Moderate protein-calorie malnutrition ([place])   . Adjustment disorder with mixed anxiety and depressed mood 08/15/2018  . Wound dehiscence   . Wound infection s/p L transmetatarsal amputation   . CKD (chronic kidney disease), stage V ([place])   . Acute on chronic anemia  07/03/2018  . Fecal incontinence 02/04/2018  . Healthcare maintenance 09/17/2017  . Right leg swelling 02/26/2017  . Superficial ulcerative lesion ([place]) 02/02/2017  . Aortic atherosclerosis ([place]) 09/30/2016  . Diabetic foot ulcer associated with diabetes mellitus due to underlying condition ([place]) 07/08/2016  . Human immunodeficiency virus (HIV) disease ([place]) 04/23/2016  . Gastroparesis   . Moderate nonproliferative diabetic retinopathy of both eyes ([place]) 11/21/2014  . Esophageal reflux   . Microscopic hematuria 10/31/2014  . Hypertension 11/16/2013  . TOBACCO USER 06/28/2006  . Depression 06/28/2006  . Diabetes mellitus type 2 in obese [facility]) 06/28/1994   Past Medical History:  Diagnosis Date  . CKD (chronic kidney disease) stage 5, GFR less than 15 ml/min (HCC) 06/19/2016   06/30/16: Seen by Dr. Posey Pronto [Nephrology]. Suspect etiologies to include diabetes and hypertension though cannot exclude HIV-associated nephropathy. Advised her against from using excessive Goody powder.  . Contact dermatitis 04/21/2016   07/29/16: Skin biopsy performed by [facility] notable for acute spongiotic dermatitis consistent with contact dermatitis.  . Cyclical vomiting    THC induced???  . Depression 06/28/2006   Qualifier: Diagnosis of  By: Riccardo Dubin MD, [place]    . Diabetes mellitus type 2 in obese ([place]) 06/28/1994  . DKA (diabetic ketoacidoses) ([place]) 12/2014  . Erosive esophagitis   . Esophageal reflux   . Essential hypertension 11/16/2013  . Gastroparesis    ? diabetic  . GERD (gastroesophageal reflux disease)   . Human immunodeficiency virus (HIV) disease ([place]) 04/23/2016  . Hypertension   . Moderate nonproliferative diabetic retinopathy of both eyes ([place]) 11/21/2014   11/14/14: Noted on retinal imaging; needs follow-up imaging in 6 months  05/22/16: Noted on retinal imaging again; needs follow-up imaging in 6 months  . Normocytic anemia   . TOBACCO USER 06/28/2006   Qualifier: Diagnosis  of  By: Riccardo Dubin MD, Sherren Mocha    . Type II diabetes mellitus ([place]) 1994   diagnosed around 52    Family History  Problem Relation Age of Onset  . Diabetes Mother   . Diabetes Brother   . Diabetes Daughter   . Diabetes Daughter   . Mental retardation Brother        died from PNA  . Diabetes Maternal Grandmother     Past Surgical History:  Procedure Laterality Date  . AMPUTATION Left 07/08/2018   Procedure: AMPUTATION FORTH RAY LEFT FOOT;  Surgeon: [name], MD;  Location: [facility];  Service: Orthopedics;  Laterality: Left;  . AMPUTATION Left 08/09/2018   Procedure: Left Transmetatarsal Amputation;  Surgeon: [name], MD;  Location: [facility];  Service: Orthopedics;  Laterality: Left;  .  AMPUTATION Left 10/08/2018   Procedure: LEFT BELOW KNEE AMPUTATION;  Surgeon: [name], MD;  Location: [facility];  Service: Orthopedics;  Laterality: Left;  . AMPUTATION Left 10/28/2018   Procedure: REVISION BELOW KNEE AMPUTATION;  Surgeon: [name], MD;  Location: [facility];  Service: Orthopedics;  Laterality: Left;  . AV FISTULA PLACEMENT Left 10/14/2018   Procedure: Arteriovenous (Av) Fistula Creation Left Arm;  Surgeon: [name], MD;  Location: [facility];  Service: Vascular;  Laterality: Left;  . LOWER EXTREMITY ANGIOGRAPHY N/A 07/05/2018   Procedure: LOWER EXTREMITY ANGIOGRAPHY;  Surgeon: Serafina Mitchell, MD;  Location: [facility];  Service: Cardiovascular;  Laterality: N/A;  . PERIPHERAL VASCULAR BALLOON ANGIOPLASTY Left 07/05/2018   Procedure: PERIPHERAL VASCULAR BALLOON ANGIOPLASTY;  Surgeon: Serafina Mitchell, MD;  Location: [facility];  Service: Cardiovascular;  Laterality: Left;  SFA  . STUMP REVISION Left 10/19/2018   Procedure: REVISION LEFT BELOW KNEE AMPUTATION;  Surgeon: [name], MD;  Location: [facility];  Service: Orthopedics;  Laterality: Left;  . TUBAL LIGATION  2002   Social History   Occupational History    Employer: UNEMPLOYED  Tobacco Use  .  Smoking status: Former Smoker    Packs/day: 0.10    Years: 10.00    Pack years: 1.00    Types: Cigarettes    Start date: 11/17/2013    Last attempt to quit: 09/09/2014    Years since quitting: 4.2  . Smokeless tobacco: Never Used  Substance and Sexual Activity  . Alcohol use: Yes    Comment: Occasionally.  . Drug use: Yes    Frequency: 4.0 times per week    Types: Marijuana  . Sexual activity: Not on file

## 2018-12-13 ENCOUNTER — Telehealth: Payer: Self-pay | Admitting: Orthopedic Surgery

## 2018-12-13 ENCOUNTER — Other Ambulatory Visit: Payer: Self-pay | Admitting: Orthopedic Surgery

## 2018-12-13 DIAGNOSIS — Z89512 Acquired absence of left leg below knee: Secondary | ICD-10-CM

## 2018-12-13 MED ORDER — OXYCODONE-ACETAMINOPHEN 5-325 MG PO TABS: 1.0000 | ORAL_TABLET | Freq: Four times a day (QID) | ORAL | 0 refills | Status: DC | PRN

## 2018-12-13 NOTE — Telephone Encounter (Signed)
Patient called needing Rx refilled (Oxycodone) The number to contact patient is [phone]

## 2018-12-13 NOTE — Telephone Encounter (Signed)
rx sent to [facility]

## 2018-12-13 NOTE — Telephone Encounter (Signed)
Tried to call pt to advise rx is at [facility] and there is no answer. vm is full.

## 2018-12-13 NOTE — Telephone Encounter (Signed)
Pt is requesting refill on percocet. Last refill 11/17/18 #28 pt is sch for surgery Friday

## 2018-12-15 ENCOUNTER — Encounter (HOSPITAL_COMMUNITY): Payer: Self-pay | Admitting: *Deleted

## 2018-12-15 NOTE — Progress Notes (Signed)
Ms Galas denies chest pain or shortness of breath. Patient denies that she or her family has experienced any of the following: Cough Fever >100.4 Runny Nose Sore Throat Difficulty breathing/ shortness of breath Travel in past 14 days - no I instructed Ms Esteban on ERAS diet- no solids after midnight, clear liquids and what they consist of.  Patient said she would purchase G2 drink, I asked her to not drink red and to drink 12 ounces - to be finished by 4:30 AM. NO more liquids after 4:30 am.  Ms Yazdani has type II diabetes, she reports that CBG's range 100- 140. I instructed patient to take 2 units of Lantus at hs tonight and to hold Victoza in am.  I instructed patient to check CBG after awaking and every 2 hours until arrival  to the hospital.  I Instructed patient if CBG is less than 70 to drink 1/2 cup of a clear juice. Recheck CBG in 15 minutes then call pre- op desk at [phone] for further instructions.

## 2018-12-15 NOTE — Anesthesia Preprocedure Evaluation (Deleted)
Anesthesia Evaluation    Airway        Dental   Pulmonary former smoker,           Cardiovascular hypertension, Pt. on medications      Neuro/Psych    GI/Hepatic   Endo/Other  diabetes  Renal/GU      Musculoskeletal   Abdominal   Peds  Hematology   Anesthesia Other Findings   Reproductive/Obstetrics                                                             Anesthesia Evaluation  Patient identified by MRN, date of birth, ID band Patient awake    Reviewed: Allergy & Precautions, NPO status , Patient's Chart, lab work & pertinent test results  Airway Mallampati: II  TM Distance: >3 FB Neck ROM: Full    Dental no notable dental hx. (+) Poor Dentition,    Pulmonary neg pulmonary ROS, former smoker,    Pulmonary exam normal breath sounds clear to auscultation       Cardiovascular hypertension, Pt. on medications negative cardio ROS Normal cardiovascular exam Rhythm:Regular Rate:Normal     Neuro/Psych negative neurological ROS  negative psych ROS   GI/Hepatic PUD, GERD  Controlled,  Endo/Other  negative endocrine ROSdiabetes, Type 2, Insulin Dependent  Renal/GU Renal InsufficiencyRenal disease  negative genitourinary   Musculoskeletal negative musculoskeletal ROS (+)   Abdominal   Peds  Hematology  (+) Blood dyscrasia (Hgb 7.3), anemia , HIV,   Anesthesia Other Findings   Reproductive/Obstetrics negative OB ROS                             Anesthesia Physical Anesthesia Plan  ASA: III  Anesthesia Plan: General   Post-op Pain Management:    Induction: Intravenous  PONV Risk Score and Plan: 3 and Ondansetron, Dexamethasone and Midazolam  Airway Management Planned: Oral ETT  Additional Equipment:   Intra-op Plan:   Post-operative Plan: Extubation in OR  Informed Consent: I have reviewed the patients History and  Physical, chart, labs and discussed the procedure including the risks, benefits and alternatives for the proposed anesthesia with the patient or authorized representative who has indicated his/her understanding and acceptance.     Dental advisory given  Plan Discussed with: CRNA  Anesthesia Plan Comments:         Anesthesia Quick Evaluation  Anesthesia Physical Anesthesia Plan  ASA: II  Anesthesia Plan: General   Post-op Pain Management:    Induction: Intravenous  PONV Risk Score and Plan:   Airway Management Planned: Oral ETT  Additional Equipment:   Intra-op Plan:   Post-operative Plan: Extubation in OR  Informed Consent:   Plan Discussed with:   Anesthesia Plan Comments: (CKD V s/p fistula placement on 10/14/2018, not yet on HD. Recent admission 4/16-4/18/20 for fluid overload. Responded to diuretics. Echo during admission showed EF 60-65%, mild LVH, elevated end diastolic pressure, normal wall motion, no significant valvular abnormalities.    Per orthopedics note, nephrology will be consulted during admission.   Pt unable to be contact preoperatively. No answer at phone, VM full. Per notes in Epic, other providers have also attempted to contact without success.)       Anesthesia Quick Evaluation

## 2018-12-15 NOTE — Anesthesia Preprocedure Evaluation (Signed)
Anesthesia Evaluation  Patient identified by MRN, date of birth, ID band Patient awake    Reviewed: Allergy & Precautions, H&P , NPO status , Patient's Chart, lab work & pertinent test results, reviewed documented beta blocker date and time   Airway Mallampati: II  TM Distance: >3 FB Neck ROM: full    Dental no notable dental hx.    Pulmonary neg pulmonary ROS, former smoker,    Pulmonary exam normal breath sounds clear to auscultation       Cardiovascular Exercise Tolerance: Good hypertension, Pt. on medications negative cardio ROS   Rhythm:regular Rate:Normal     Neuro/Psych PSYCHIATRIC DISORDERS Depression negative neurological ROS     GI/Hepatic Neg liver ROS, PUD, GERD  Medicated,  Endo/Other  diabetes, Type 2  Renal/GU DialysisRenal disease  negative genitourinary   Musculoskeletal   Abdominal   Peds  Hematology  (+) Blood dyscrasia, anemia ,   Anesthesia Other Findings   Reproductive/Obstetrics negative OB ROS                             Anesthesia Physical Anesthesia Plan  ASA: IV  Anesthesia Plan: General   Post-op Pain Management:    Induction:   PONV Risk Score and Plan: 3 and Ondansetron, Treatment may vary due to age or medical condition and Midazolam  Airway Management Planned: Oral ETT and LMA  Additional Equipment:   Intra-op Plan:   Post-operative Plan: Extubation in OR  Informed Consent: I have reviewed the patients History and Physical, chart, labs and discussed the procedure including the risks, benefits and alternatives for the proposed anesthesia with the patient or authorized representative who has indicated his/her understanding and acceptance.     Dental Advisory Given  Plan Discussed with: CRNA, Anesthesiologist and Surgeon  Anesthesia Plan Comments:         Anesthesia Quick Evaluation

## 2018-12-16 ENCOUNTER — Inpatient Hospital Stay (HOSPITAL_COMMUNITY): Payer: Medicaid Other

## 2018-12-16 ENCOUNTER — Inpatient Hospital Stay (HOSPITAL_COMMUNITY): Payer: Medicaid Other | Admitting: Physician Assistant

## 2018-12-16 ENCOUNTER — Inpatient Hospital Stay (HOSPITAL_COMMUNITY)
Admission: RE | Admit: 2018-12-16 | Discharge: 2018-12-20 | DRG: 475 | Disposition: A | Discharge status: Home Health | Payer: Medicaid Other | Attending: Orthopedic Surgery | Admitting: Orthopedic Surgery

## 2018-12-16 ENCOUNTER — Encounter (HOSPITAL_COMMUNITY)
Admission: RE | Disposition: A | Discharge status: Home Health | Payer: Self-pay | Source: Home / Self Care | Attending: Orthopedic Surgery

## 2018-12-16 ENCOUNTER — Other Ambulatory Visit: Payer: Self-pay

## 2018-12-16 ENCOUNTER — Encounter (HOSPITAL_COMMUNITY): Payer: Self-pay

## 2018-12-16 DIAGNOSIS — Z21 Asymptomatic human immunodeficiency virus [HIV] infection status: Secondary | ICD-10-CM | POA: Diagnosis not present

## 2018-12-16 DIAGNOSIS — K219 Gastro-esophageal reflux disease without esophagitis: Secondary | ICD-10-CM | POA: Diagnosis not present

## 2018-12-16 DIAGNOSIS — E872 Acidosis: Secondary | ICD-10-CM | POA: Diagnosis present

## 2018-12-16 DIAGNOSIS — I120 Hypertensive chronic kidney disease with stage 5 chronic kidney disease or end stage renal disease: Secondary | ICD-10-CM | POA: Diagnosis present

## 2018-12-16 DIAGNOSIS — T8781 Dehiscence of amputation stump: Secondary | ICD-10-CM | POA: Diagnosis not present

## 2018-12-16 DIAGNOSIS — Z79899 Other long term (current) drug therapy: Secondary | ICD-10-CM

## 2018-12-16 DIAGNOSIS — K3184 Gastroparesis: Secondary | ICD-10-CM | POA: Diagnosis present

## 2018-12-16 DIAGNOSIS — Z87891 Personal history of nicotine dependence: Secondary | ICD-10-CM | POA: Diagnosis not present

## 2018-12-16 DIAGNOSIS — F329 Major depressive disorder, single episode, unspecified: Secondary | ICD-10-CM | POA: Diagnosis present

## 2018-12-16 DIAGNOSIS — Z9851 Tubal ligation status: Secondary | ICD-10-CM | POA: Diagnosis not present

## 2018-12-16 DIAGNOSIS — E1143 Type 2 diabetes mellitus with diabetic autonomic (poly)neuropathy: Secondary | ICD-10-CM | POA: Diagnosis present

## 2018-12-16 DIAGNOSIS — Z79891 Long term (current) use of opiate analgesic: Secondary | ICD-10-CM | POA: Diagnosis not present

## 2018-12-16 DIAGNOSIS — Z833 Family history of diabetes mellitus: Secondary | ICD-10-CM | POA: Diagnosis not present

## 2018-12-16 DIAGNOSIS — Y835 Amputation of limb(s) as the cause of abnormal reaction of the patient, or of later complication, without mention of misadventure at the time of the procedure: Secondary | ICD-10-CM | POA: Diagnosis present

## 2018-12-16 DIAGNOSIS — E1122 Type 2 diabetes mellitus with diabetic chronic kidney disease: Secondary | ICD-10-CM | POA: Diagnosis not present

## 2018-12-16 DIAGNOSIS — T4145XA Adverse effect of unspecified anesthetic, initial encounter: Secondary | ICD-10-CM | POA: Diagnosis not present

## 2018-12-16 DIAGNOSIS — D631 Anemia in chronic kidney disease: Secondary | ICD-10-CM | POA: Diagnosis present

## 2018-12-16 DIAGNOSIS — Z8719 Personal history of other diseases of the digestive system: Secondary | ICD-10-CM

## 2018-12-16 DIAGNOSIS — Z794 Long term (current) use of insulin: Secondary | ICD-10-CM | POA: Diagnosis not present

## 2018-12-16 DIAGNOSIS — Z452 Encounter for adjustment and management of vascular access device: Secondary | ICD-10-CM

## 2018-12-16 DIAGNOSIS — Z89512 Acquired absence of left leg below knee: Secondary | ICD-10-CM

## 2018-12-16 DIAGNOSIS — Z7982 Long term (current) use of aspirin: Secondary | ICD-10-CM | POA: Diagnosis not present

## 2018-12-16 DIAGNOSIS — Z89612 Acquired absence of left leg above knee: Secondary | ICD-10-CM

## 2018-12-16 DIAGNOSIS — R112 Nausea with vomiting, unspecified: Secondary | ICD-10-CM | POA: Diagnosis not present

## 2018-12-16 DIAGNOSIS — N185 Chronic kidney disease, stage 5: Secondary | ICD-10-CM | POA: Diagnosis present

## 2018-12-16 DIAGNOSIS — N2581 Secondary hyperparathyroidism of renal origin: Secondary | ICD-10-CM | POA: Diagnosis not present

## 2018-12-16 DIAGNOSIS — T50995A Adverse effect of other drugs, medicaments and biological substances, initial encounter: Secondary | ICD-10-CM | POA: Diagnosis not present

## 2018-12-16 DIAGNOSIS — Y92230 Patient room in hospital as the place of occurrence of the external cause: Secondary | ICD-10-CM | POA: Diagnosis not present

## 2018-12-16 DIAGNOSIS — Z810 Family history of intellectual disabilities: Secondary | ICD-10-CM

## 2018-12-16 DIAGNOSIS — E113393 Type 2 diabetes mellitus with moderate nonproliferative diabetic retinopathy without macular edema, bilateral: Secondary | ICD-10-CM | POA: Diagnosis present

## 2018-12-16 HISTORY — PX: AMPUTATION: SHX166

## 2018-12-16 HISTORY — DX: Personal history of other medical treatment: Z92.89

## 2018-12-16 LAB — COMPREHENSIVE METABOLIC PANEL
ALT: 8 U/L (ref 0–44)
AST: 18 U/L (ref 15–41)
Albumin: 3.8 g/dL (ref 3.5–5.0)
Alkaline Phosphatase: 120 U/L (ref 38–126)
Anion gap: 12 (ref 5–15)
BUN: 31 mg/dL — ABNORMAL HIGH (ref 6–20)
CO2: 13 mmol/L — ABNORMAL LOW (ref 22–32)
Calcium: 8.8 mg/dL — ABNORMAL LOW (ref 8.9–10.3)
Chloride: 109 mmol/L (ref 98–111)
Creatinine, Ser: 6.27 mg/dL — ABNORMAL HIGH (ref 0.44–1.00)
GFR calc Af Amer: 9 mL/min — ABNORMAL LOW (ref >60)
GFR calc non Af Amer: 8 mL/min — ABNORMAL LOW (ref >60)
Glucose, Bld: 130 mg/dL — ABNORMAL HIGH (ref 70–99)
Potassium: 4.7 mmol/L (ref 3.5–5.1)
Sodium: 134 mmol/L — ABNORMAL LOW (ref 135–145)
Total Bilirubin: 0.5 mg/dL (ref 0.3–1.2)
Total Protein: 8.9 g/dL — ABNORMAL HIGH (ref 6.5–8.1)

## 2018-12-16 LAB — GLUCOSE, CAPILLARY
Glucose-Capillary: 135 mg/dL — ABNORMAL HIGH (ref 70–99)
Glucose-Capillary: 120 mg/dL — ABNORMAL HIGH (ref 70–99)
Glucose-Capillary: 133 mg/dL — ABNORMAL HIGH (ref 70–99)
Glucose-Capillary: 131 mg/dL — ABNORMAL HIGH (ref 70–99)
Glucose-Capillary: 120 mg/dL — ABNORMAL HIGH (ref 70–99)

## 2018-12-16 LAB — POCT PREGNANCY, URINE: Preg Test, Ur: NEGATIVE

## 2018-12-16 LAB — CBC
HCT: 30.7 % — ABNORMAL LOW (ref 36.0–46.0)
Hemoglobin: 9.4 g/dL — ABNORMAL LOW (ref 12.0–15.0)
MCH: 30.2 pg (ref 26.0–34.0)
MCHC: 30.6 g/dL (ref 30.0–36.0)
MCV: 98.7 fL (ref 80.0–100.0)
Platelets: 282 10*3/uL (ref 150–400)
RBC: 3.11 MIL/uL — ABNORMAL LOW (ref 3.87–5.11)
RDW: 17.4 % — ABNORMAL HIGH (ref 11.5–15.5)
WBC: 7 10*3/uL (ref 4.0–10.5)
nRBC: 0 % (ref 0.0–0.2)

## 2018-12-16 SURGERY — AMPUTATION, ABOVE KNEE
Anesthesia: General | Site: Leg Upper | Laterality: Left

## 2018-12-16 MED ORDER — SODIUM BICARBONATE 650 MG PO TABS
650.0000 mg | ORAL_TABLET | Freq: Two times a day (BID) | ORAL | Status: DC
  Administered 2018-12-16: 650 mg via ORAL
  Filled 2018-12-16 (×4): qty 1

## 2018-12-16 MED ORDER — FENTANYL CITRATE (PF) 100 MCG/2ML IJ SOLN
INTRAMUSCULAR | Status: DC | PRN
  Administered 2018-12-16: 100 ug via INTRAVENOUS
  Administered 2018-12-16: 50 ug via INTRAVENOUS
  Administered 2018-12-16: 100 ug via INTRAVENOUS

## 2018-12-16 MED ORDER — LIDOCAINE 2% (20 MG/ML) 5 ML SYRINGE
INTRAMUSCULAR | Status: DC | PRN
  Administered 2018-12-16: 80 mg via INTRAVENOUS

## 2018-12-16 MED ORDER — FENTANYL CITRATE (PF) 100 MCG/2ML IJ SOLN
INTRAMUSCULAR | Status: AC
  Administered 2018-12-16: 50 ug via INTRAVENOUS
  Filled 2018-12-16: qty 2

## 2018-12-16 MED ORDER — OXYCODONE-ACETAMINOPHEN 5-325 MG PO TABS: 1.0000 | ORAL_TABLET | Freq: Four times a day (QID) | ORAL | Status: DC | PRN

## 2018-12-16 MED ORDER — ACETAMINOPHEN 325 MG PO TABS: 325.0000 mg | ORAL_TABLET | ORAL | Status: DC | PRN

## 2018-12-16 MED ORDER — SODIUM BICARBONATE 650 MG PO TABS: 1300.0000 mg | ORAL_TABLET | Freq: Two times a day (BID) | ORAL | Status: DC

## 2018-12-16 MED ORDER — DOLUTEGRAVIR-RILPIVIRINE 50-25 MG PO TABS: 1.0000 | ORAL_TABLET | Freq: Every day | ORAL | Status: DC

## 2018-12-16 MED ORDER — AMLODIPINE BESYLATE 10 MG PO TABS
10.0000 mg | ORAL_TABLET | Freq: Every day | ORAL | Status: DC
  Administered 2018-12-17 – 2018-12-20 (×4): 10 mg via ORAL
  Filled 2018-12-16 (×4): qty 1

## 2018-12-16 MED ORDER — INSULIN GLARGINE 100 UNIT/ML ~~LOC~~ SOLN
5.0000 [IU] | Freq: Every day | SUBCUTANEOUS | Status: DC
  Administered 2018-12-17 – 2018-12-19 (×3): 5 [IU] via SUBCUTANEOUS
  Filled 2018-12-16 (×4): qty 0.05

## 2018-12-16 MED ORDER — FENTANYL CITRATE (PF) 100 MCG/2ML IJ SOLN
25.0000 ug | INTRAMUSCULAR | Status: DC | PRN
  Administered 2018-12-16 (×3): 50 ug via INTRAVENOUS

## 2018-12-16 MED ORDER — GABAPENTIN 300 MG PO CAPS: 300.0000 mg | ORAL_CAPSULE | Freq: Two times a day (BID) | ORAL | Status: DC

## 2018-12-16 MED ORDER — SENNA 8.6 MG PO TABS
2.0000 | ORAL_TABLET | Freq: Two times a day (BID) | ORAL | Status: DC
  Administered 2018-12-16 – 2018-12-19 (×6): 17.2 mg via ORAL
  Filled 2018-12-16 (×9): qty 2

## 2018-12-16 MED ORDER — EPHEDRINE SULFATE-NACL 50-0.9 MG/10ML-% IV SOSY
PREFILLED_SYRINGE | INTRAVENOUS | Status: DC | PRN
  Administered 2018-12-16 (×2): 5 mg via INTRAVENOUS

## 2018-12-16 MED ORDER — MIDAZOLAM HCL 2 MG/2ML IJ SOLN
INTRAMUSCULAR | Status: AC
  Filled 2018-12-16: qty 2

## 2018-12-16 MED ORDER — ONDANSETRON HCL 4 MG/2ML IJ SOLN
INTRAMUSCULAR | Status: AC
  Filled 2018-12-16: qty 2

## 2018-12-16 MED ORDER — CHLORHEXIDINE GLUCONATE 4 % EX LIQD: 60.0000 mL | Freq: Once | CUTANEOUS | Status: DC

## 2018-12-16 MED ORDER — FENTANYL CITRATE (PF) 250 MCG/5ML IJ SOLN
INTRAMUSCULAR | Status: AC
  Filled 2018-12-16: qty 5

## 2018-12-16 MED ORDER — ONDANSETRON HCL 4 MG/2ML IJ SOLN: 4.0000 mg | Freq: Once | INTRAMUSCULAR | Status: DC | PRN

## 2018-12-16 MED ORDER — FUROSEMIDE 40 MG PO TABS
40.0000 mg | ORAL_TABLET | Freq: Two times a day (BID) | ORAL | Status: DC
  Administered 2018-12-16: 40 mg via ORAL
  Filled 2018-12-16 (×2): qty 1

## 2018-12-16 MED ORDER — OXYCODONE HCL 5 MG/5ML PO SOLN: 5.0000 mg | Freq: Once | ORAL | Status: DC | PRN

## 2018-12-16 MED ORDER — METHOCARBAMOL 500 MG PO TABS
500.0000 mg | ORAL_TABLET | Freq: Four times a day (QID) | ORAL | Status: DC | PRN
  Administered 2018-12-16 – 2018-12-20 (×8): 500 mg via ORAL
  Filled 2018-12-16 (×9): qty 1

## 2018-12-16 MED ORDER — SODIUM CHLORIDE 0.9 % IV SOLN
INTRAVENOUS | Status: DC
  Administered 2018-12-16 (×2): via INTRAVENOUS

## 2018-12-16 MED ORDER — LABETALOL HCL 100 MG PO TABS
300.0000 mg | ORAL_TABLET | Freq: Two times a day (BID) | ORAL | Status: DC
  Administered 2018-12-16 – 2018-12-20 (×8): 300 mg via ORAL
  Filled 2018-12-16 (×8): qty 3

## 2018-12-16 MED ORDER — MEPERIDINE HCL 25 MG/ML IJ SOLN: 6.2500 mg | INTRAMUSCULAR | Status: DC | PRN

## 2018-12-16 MED ORDER — MEGESTROL ACETATE 40 MG PO TABS
40.0000 mg | ORAL_TABLET | Freq: Every day | ORAL | Status: DC
  Administered 2018-12-17 – 2018-12-20 (×4): 40 mg via ORAL
  Filled 2018-12-16 (×4): qty 1

## 2018-12-16 MED ORDER — PROPOFOL 10 MG/ML IV BOLUS
INTRAVENOUS | Status: DC | PRN
  Administered 2018-12-16: 150 mg via INTRAVENOUS

## 2018-12-16 MED ORDER — ONDANSETRON HCL 4 MG PO TABS: 4.0000 mg | ORAL_TABLET | Freq: Four times a day (QID) | ORAL | Status: DC | PRN

## 2018-12-16 MED ORDER — JUVEN PO PACK
1.0000 | PACK | Freq: Two times a day (BID) | ORAL | Status: DC
  Administered 2018-12-16 – 2018-12-20 (×5): 1 via ORAL
  Filled 2018-12-16 (×9): qty 1

## 2018-12-16 MED ORDER — LIRAGLUTIDE 18 MG/3ML ~~LOC~~ SOPN: 1.2000 mg | PEN_INJECTOR | Freq: Every day | SUBCUTANEOUS | Status: DC

## 2018-12-16 MED ORDER — OXYCODONE HCL 5 MG PO TABS
5.0000 mg | ORAL_TABLET | ORAL | Status: DC | PRN
  Administered 2018-12-19 (×2): 10 mg via ORAL
  Filled 2018-12-16 (×4): qty 2

## 2018-12-16 MED ORDER — METOCLOPRAMIDE HCL 5 MG/ML IJ SOLN
5.0000 mg | Freq: Three times a day (TID) | INTRAMUSCULAR | Status: DC | PRN
  Administered 2018-12-17 (×2): 5 mg via INTRAVENOUS
  Filled 2018-12-16 (×2): qty 2

## 2018-12-16 MED ORDER — OXYCODONE HCL 5 MG PO TABS
10.0000 mg | ORAL_TABLET | ORAL | Status: DC | PRN
  Administered 2018-12-16: 10 mg via ORAL
  Administered 2018-12-16 – 2018-12-19 (×4): 15 mg via ORAL
  Administered 2018-12-19: 10 mg via ORAL
  Administered 2018-12-20 (×2): 15 mg via ORAL
  Filled 2018-12-16: qty 2
  Filled 2018-12-16 (×7): qty 3

## 2018-12-16 MED ORDER — CEFAZOLIN SODIUM-DEXTROSE 2-4 GM/100ML-% IV SOLN
2.0000 g | INTRAVENOUS | Status: AC
  Administered 2018-12-16: 2 g via INTRAVENOUS
  Filled 2018-12-16: qty 100

## 2018-12-16 MED ORDER — ONDANSETRON HCL 4 MG/2ML IJ SOLN
4.0000 mg | Freq: Four times a day (QID) | INTRAMUSCULAR | Status: DC | PRN
  Administered 2018-12-16 – 2018-12-17 (×3): 4 mg via INTRAVENOUS
  Filled 2018-12-16 (×3): qty 2

## 2018-12-16 MED ORDER — SUCCINYLCHOLINE CHLORIDE 200 MG/10ML IV SOSY
PREFILLED_SYRINGE | INTRAVENOUS | Status: DC | PRN
  Administered 2018-12-16: 140 mg via INTRAVENOUS

## 2018-12-16 MED ORDER — DOCUSATE SODIUM 100 MG PO CAPS
100.0000 mg | ORAL_CAPSULE | Freq: Two times a day (BID) | ORAL | Status: DC
  Administered 2018-12-16 – 2018-12-19 (×6): 100 mg via ORAL
  Filled 2018-12-16 (×9): qty 1

## 2018-12-16 MED ORDER — EPHEDRINE 5 MG/ML INJ
INTRAVENOUS | Status: AC
  Filled 2018-12-16: qty 10

## 2018-12-16 MED ORDER — DARBEPOETIN ALFA 60 MCG/0.3ML IJ SOSY: 60.0000 ug | PREFILLED_SYRINGE | INTRAMUSCULAR | Status: DC

## 2018-12-16 MED ORDER — CALCIUM ACETATE (PHOS BINDER) 667 MG PO CAPS
1334.0000 mg | ORAL_CAPSULE | Freq: Three times a day (TID) | ORAL | Status: DC
  Administered 2018-12-16 – 2018-12-20 (×10): 1334 mg via ORAL
  Filled 2018-12-16 (×11): qty 2

## 2018-12-16 MED ORDER — RILPIVIRINE HCL 25 MG PO TABS
25.0000 mg | ORAL_TABLET | Freq: Every day | ORAL | Status: DC
  Administered 2018-12-17 – 2018-12-20 (×4): 25 mg via ORAL
  Filled 2018-12-16 (×4): qty 1

## 2018-12-16 MED ORDER — OXYCODONE HCL 5 MG PO TABS: 5.0000 mg | ORAL_TABLET | Freq: Once | ORAL | Status: DC | PRN

## 2018-12-16 MED ORDER — ONDANSETRON HCL 4 MG/2ML IJ SOLN
INTRAMUSCULAR | Status: DC | PRN
  Administered 2018-12-16: 4 mg via INTRAVENOUS

## 2018-12-16 MED ORDER — ONDANSETRON HCL 4 MG PO TABS: 4.0000 mg | ORAL_TABLET | Freq: Every day | ORAL | Status: DC | PRN

## 2018-12-16 MED ORDER — LABETALOL HCL 100 MG PO TABS: 300.0000 mg | ORAL_TABLET | Freq: Two times a day (BID) | ORAL | Status: DC

## 2018-12-16 MED ORDER — POLYETHYLENE GLYCOL 3350 17 G PO PACK: 17.0000 g | PACK | Freq: Every day | ORAL | Status: DC | PRN

## 2018-12-16 MED ORDER — ACETAMINOPHEN 500 MG PO TABS
1000.0000 mg | ORAL_TABLET | Freq: Four times a day (QID) | ORAL | Status: AC
  Administered 2018-12-16 – 2018-12-17 (×3): 1000 mg via ORAL
  Filled 2018-12-16 (×4): qty 2

## 2018-12-16 MED ORDER — 0.9 % SODIUM CHLORIDE (POUR BTL) OPTIME
TOPICAL | Status: DC | PRN
  Administered 2018-12-16: 1000 mL

## 2018-12-16 MED ORDER — GABAPENTIN 300 MG PO CAPS
300.0000 mg | ORAL_CAPSULE | Freq: Every day | ORAL | Status: DC
  Administered 2018-12-16 – 2018-12-20 (×5): 300 mg via ORAL
  Filled 2018-12-16 (×5): qty 1

## 2018-12-16 MED ORDER — MIDAZOLAM HCL 2 MG/2ML IJ SOLN
INTRAMUSCULAR | Status: DC | PRN
  Administered 2018-12-16: 2 mg via INTRAVENOUS

## 2018-12-16 MED ORDER — ACETAMINOPHEN 160 MG/5ML PO SOLN: 325.0000 mg | ORAL | Status: DC | PRN

## 2018-12-16 MED ORDER — DOLUTEGRAVIR SODIUM 50 MG PO TABS
50.0000 mg | ORAL_TABLET | Freq: Every day | ORAL | Status: DC
  Administered 2018-12-17 – 2018-12-20 (×4): 50 mg via ORAL
  Filled 2018-12-16 (×4): qty 1

## 2018-12-16 MED ORDER — CITALOPRAM HYDROBROMIDE 20 MG PO TABS
20.0000 mg | ORAL_TABLET | Freq: Every day | ORAL | Status: DC
  Administered 2018-12-16 – 2018-12-20 (×5): 20 mg via ORAL
  Filled 2018-12-16 (×5): qty 1

## 2018-12-16 MED ORDER — ACETAMINOPHEN 325 MG PO TABS
325.0000 mg | ORAL_TABLET | Freq: Four times a day (QID) | ORAL | Status: DC | PRN
  Administered 2018-12-19: 650 mg via ORAL
  Filled 2018-12-16: qty 2

## 2018-12-16 MED ORDER — HYDROMORPHONE HCL 1 MG/ML IJ SOLN
0.5000 mg | INTRAMUSCULAR | Status: DC | PRN
  Administered 2018-12-16 – 2018-12-17 (×5): 1 mg via INTRAVENOUS
  Administered 2018-12-18: 0.5 mg via INTRAVENOUS
  Filled 2018-12-16 (×6): qty 1

## 2018-12-16 MED ORDER — METOCLOPRAMIDE HCL 5 MG PO TABS: 5.0000 mg | ORAL_TABLET | Freq: Three times a day (TID) | ORAL | Status: DC | PRN

## 2018-12-16 MED ORDER — INSULIN ASPART 100 UNIT/ML ~~LOC~~ SOLN
0.0000 [IU] | Freq: Three times a day (TID) | SUBCUTANEOUS | Status: DC
  Administered 2018-12-16 – 2018-12-20 (×8): 1 [IU] via SUBCUTANEOUS

## 2018-12-16 MED ORDER — SODIUM CHLORIDE 0.9 % IV SOLN
INTRAVENOUS | Status: DC | PRN
  Administered 2018-12-16: 20 ug/min via INTRAVENOUS

## 2018-12-16 SURGICAL SUPPLY — 40 items
BLADE SAW RECIP 87.9 MT (BLADE) ×2 IMPLANT
BLADE SURG 21 STRL SS (BLADE) ×2 IMPLANT
BNDG COHESIVE 4X5 TAN STRL (GAUZE/BANDAGES/DRESSINGS) ×1 IMPLANT
BNDG COHESIVE 6X5 TAN STRL LF (GAUZE/BANDAGES/DRESSINGS) ×2 IMPLANT
CANISTER WOUND CARE 500ML ATS (WOUND CARE) ×1 IMPLANT
COVER SURGICAL LIGHT HANDLE (MISCELLANEOUS) ×2 IMPLANT
COVER WAND RF STERILE (DRAPES) IMPLANT
CUFF TOURNIQUET SINGLE 34IN LL (TOURNIQUET CUFF) IMPLANT
DRAPE INCISE IOBAN 66X45 STRL (DRAPES) ×3 IMPLANT
DRAPE U-SHAPE 47X51 STRL (DRAPES) ×2 IMPLANT
DRESSING PREVENA PLUS CUSTOM (GAUZE/BANDAGES/DRESSINGS) ×1 IMPLANT
DRSG PREVENA PLUS CUSTOM (GAUZE/BANDAGES/DRESSINGS) ×2
DURAPREP 26ML APPLICATOR (WOUND CARE) ×2 IMPLANT
ELECT REM PT RETURN 9FT ADLT (ELECTROSURGICAL) ×2
ELECTRODE REM PT RTRN 9FT ADLT (ELECTROSURGICAL) ×1 IMPLANT
GLOVE BIOGEL PI IND STRL 7.5 (GLOVE) ×1 IMPLANT
GLOVE BIOGEL PI IND STRL 9 (GLOVE) ×1 IMPLANT
GLOVE BIOGEL PI INDICATOR 7.5 (GLOVE) ×1
GLOVE BIOGEL PI INDICATOR 9 (GLOVE) ×1
GLOVE SURG ORTHO 9.0 STRL STRW (GLOVE) ×2 IMPLANT
GLOVE SURG SS PI 6.5 STRL IVOR (GLOVE) ×2 IMPLANT
GOWN STRL REUS W/ TWL LRG LVL3 (GOWN DISPOSABLE) ×1 IMPLANT
GOWN STRL REUS W/ TWL XL LVL3 (GOWN DISPOSABLE) ×2 IMPLANT
GOWN STRL REUS W/TWL LRG LVL3 (GOWN DISPOSABLE) ×2
GOWN STRL REUS W/TWL XL LVL3 (GOWN DISPOSABLE) ×4
KIT BASIN OR (CUSTOM PROCEDURE TRAY) ×2 IMPLANT
KIT TURNOVER KIT B (KITS) ×2 IMPLANT
MANIFOLD NEPTUNE II (INSTRUMENTS) ×2 IMPLANT
NS IRRIG 1000ML POUR BTL (IV SOLUTION) ×2 IMPLANT
PACK ORTHO EXTREMITY (CUSTOM PROCEDURE TRAY) ×2 IMPLANT
PAD ARMBOARD 7.5X6 YLW CONV (MISCELLANEOUS) ×2 IMPLANT
PREVENA RESTOR ARTHOFORM 46X30 (CANNISTER) ×2 IMPLANT
STAPLER VISISTAT 35W (STAPLE) IMPLANT
STOCKINETTE IMPERVIOUS LG (DRAPES) IMPLANT
SUT ETHILON 2 0 PSLX (SUTURE) ×5 IMPLANT
SUT SILK 2 0 (SUTURE) ×2
SUT SILK 2-0 18XBRD TIE 12 (SUTURE) ×1 IMPLANT
TOWEL GREEN STERILE FF (TOWEL DISPOSABLE) ×2 IMPLANT
TUBE CONNECTING 20X1/4 (TUBING) ×2 IMPLANT
YANKAUER SUCT BULB TIP NO VENT (SUCTIONS) ×2 IMPLANT

## 2018-12-16 NOTE — Anesthesia Procedure Notes (Signed)
Procedure Name: Intubation Date/Time: 12/16/2018 10:13 AM Performed by: Leonor Liv, CRNA Pre-anesthesia Checklist: Patient identified, Emergency Drugs available, Suction available and Patient being monitored Patient Re-evaluated:Patient Re-evaluated prior to induction Oxygen Delivery Method: Circle System Utilized Preoxygenation: Pre-oxygenation with 100% oxygen Induction Type: IV induction and Rapid sequence Laryngoscope Size: Mac and 3 Grade View: Grade II Tube type: Oral Tube size: 7.0 mm Number of attempts: 1 Airway Equipment and Method: Stylet and Oral airway Placement Confirmation: ETT inserted through vocal cords under direct vision,  positive ETCO2 and breath sounds checked- equal and bilateral Secured at: 21 cm Tube secured with: Tape Dental Injury: Teeth and Oropharynx as per pre-operative assessment

## 2018-12-16 NOTE — Transfer of Care (Signed)
Immediate Anesthesia Transfer of Care Note  Patient: Kathy Frank  Procedure(s) Performed: LEFT ABOVE KNEE AMPUTATION (Left Leg Upper)  Patient Location: PACU  Anesthesia Type:General  Level of Consciousness: awake, alert  and oriented  Airway & Oxygen Therapy: Patient Spontanous Breathing and Patient connected to nasal cannula oxygen  Post-op Assessment: Report given to RN and Post -op Vital signs reviewed and stable  Post vital signs: Reviewed and stable  Last Vitals:  Vitals Value Taken Time  BP    Temp    Pulse    Resp    SpO2      Last Pain:  Vitals:   12/16/18 0701  TempSrc:   PainSc: 0-No pain         Complications: No apparent anesthesia complications

## 2018-12-16 NOTE — Social Work (Addendum)
CSW acknowledging consult for SNF placement. Will follow for therapy recommendations.  If pt potentially needs SNF please order COVID test!   Westley Hummer, MSW, [place] Work [phone]

## 2018-12-16 NOTE — Anesthesia Procedure Notes (Addendum)
Central Venous Catheter Insertion Performed by: Janeece Riggers, MD, anesthesiologist Start/End5/03/2019 8:20 AM, 12/16/2018 8:35 AM Patient location: Pre-op. Preanesthetic checklist: patient identified, site marked, risks and benefits discussed, surgical consent, monitors and equipment checked, pre-op evaluation, timeout performed and anesthesia consent Position: Trendelenburg Lidocaine 1% used for infiltration and patient sedated Hand hygiene performed  and maximum sterile barriers used  Catheter size: 8 Fr Total catheter length 16. Central line was placed.Double lumen Procedure performed using ultrasound guided technique. Ultrasound Notes:anatomy identified, needle tip was noted to be adjacent to the nerve/plexus identified, no ultrasound evidence of intravascular and/or intraneural injection and image(s) printed for medical record Attempts: 1 Following insertion, dressing applied, line sutured and Biopatch. Post procedure assessment: blood return through all ports  Patient tolerated the procedure well with no immediate complications. Additional procedure comments: CXR confirms proper position.

## 2018-12-16 NOTE — Anesthesia Postprocedure Evaluation (Signed)
Anesthesia Post Note  Patient: Kathy Frank  Procedure(s) Performed: LEFT ABOVE KNEE AMPUTATION (Left Leg Upper)     Patient location during evaluation: PACU Anesthesia Type: General Level of consciousness: awake and alert Pain management: pain level controlled Vital Signs Assessment: post-procedure vital signs reviewed and stable Respiratory status: spontaneous breathing, nonlabored ventilation, respiratory function stable and patient connected to nasal cannula oxygen Cardiovascular status: blood pressure returned to baseline and stable Postop Assessment: no apparent nausea or vomiting Anesthetic complications: no    Last Vitals:  Vitals:   12/16/18 1205 12/16/18 1234  BP: 122/69 135/75  Pulse: 72 72  Resp: 16 16  Temp: (!) 36.2 C 36.6 C  SpO2: 100% 98%    Last Pain:  Vitals:   12/16/18 1234  TempSrc: Oral  PainSc: 8                  Taahir Grisby

## 2018-12-16 NOTE — Progress Notes (Signed)
Physical medicine rehab consult requested chart reviewed.  Status post left AKA today 12/16/2018.  Await physical and occupational therapy evaluations and follow-up with appropriate recommendations

## 2018-12-16 NOTE — Consult Note (Signed)
[facility]    NEPHROLOGY CONSULTATION NOTE  PATIENT ID:  Kathy Frank, DOB:  10/22/1975  HPI: The patient is a 43 y.o. year old female who presented due to dehiscence of her left transtibial amputation site from 10/28/2018.  She has a history of chronic kidney disease stage V and recently had an AV fistula placed for dialysis.  She has a history of type 2 diabetes and has failed to heal her amputation site.  She presented for revision with a planned left above-the-knee amputation.  She otherwise presents with no acute complaints.   Past Medical History:  Diagnosis Date  . CKD (chronic kidney disease) stage 5, GFR less than 15 ml/min (HCC) 06/19/2016   06/30/16: Seen by Dr. Posey Pronto [Nephrology]. Suspect etiologies to include diabetes and hypertension though cannot exclude HIV-associated nephropathy. Advised her against from using excessive Goody powder.  . Contact dermatitis 04/21/2016   07/29/16: Skin biopsy performed by [facility] notable for acute spongiotic dermatitis consistent with contact dermatitis.  . Cyclical vomiting    THC induced???  . Depression 06/28/2006   Qualifier: Diagnosis of  By: Riccardo Dubin MD, [place]    . Diabetes mellitus type 2 in obese ([place]) 06/28/1994  . DKA (diabetic ketoacidoses) ([place]) 12/2014  . Erosive esophagitis   . Esophageal reflux   . Essential hypertension 11/16/2013  . Gastroparesis    ? diabetic  . GERD (gastroesophageal reflux disease)   . History of blood transfusion   . Human immunodeficiency virus (HIV) disease ([place]) 04/23/2016  . Hypertension   . Moderate nonproliferative diabetic retinopathy of both eyes ([place]) 11/21/2014   11/14/14: Noted on retinal imaging; needs follow-up imaging in 6 months  05/22/16: Noted on retinal imaging again; needs follow-up imaging in 6 months  . Normocytic anemia   . TOBACCO USER 06/28/2006   Qualifier: Diagnosis of  By: Riccardo Dubin MD, Sherren Mocha    . Type II diabetes mellitus ([place]) 1994   diagnosed  around 1994    Past Surgical History:  Procedure Laterality Date  . AMPUTATION Left 07/08/2018   Procedure: AMPUTATION FORTH RAY LEFT FOOT;  Surgeon: [name], MD;  Location: [place];  Service: Orthopedics;  Laterality: Left;  . AMPUTATION Left 08/09/2018   Procedure: Left Transmetatarsal Amputation;  Surgeon: [name], MD;  Location: [place];  Service: Orthopedics;  Laterality: Left;  . AMPUTATION Left 10/08/2018   Procedure: LEFT BELOW KNEE AMPUTATION;  Surgeon: [name], MD;  Location: [place];  Service: Orthopedics;  Laterality: Left;  . AMPUTATION Left 10/28/2018   Procedure: REVISION BELOW KNEE AMPUTATION;  Surgeon: [name], MD;  Location: [place];  Service: Orthopedics;  Laterality: Left;  . AV FISTULA PLACEMENT Left 10/14/2018   Procedure: Arteriovenous (Av) Fistula Creation Left Arm;  Surgeon: [name], MD;  Location: [place];  Service: Vascular;  Laterality: Left;  . LOWER EXTREMITY ANGIOGRAPHY N/A 07/05/2018   Procedure: LOWER EXTREMITY ANGIOGRAPHY;  Surgeon: Serafina Mitchell, MD;  Location: [place] [facility];  Service: Cardiovascular;  Laterality: N/A;  . PERIPHERAL VASCULAR BALLOON ANGIOPLASTY Left 07/05/2018   Procedure: PERIPHERAL VASCULAR BALLOON ANGIOPLASTY;  Surgeon: Serafina Mitchell, MD;  Location: [place] [facility];  Service: Cardiovascular;  Laterality: Left;  SFA  . STUMP REVISION Left 10/19/2018   Procedure: REVISION LEFT BELOW KNEE AMPUTATION;  Surgeon: [name], MD;  Location: [place];  Service: Orthopedics;  Laterality: Left;  . TUBAL LIGATION  2002    Family  History  Problem Relation Age of Onset  . Diabetes Mother   . Diabetes Brother   . Diabetes Daughter   . Diabetes Daughter   . Mental retardation Brother        died from PNA  . Diabetes Maternal Grandmother     Social History   Tobacco Use  . Smoking status: Former Smoker    Packs/day: 0.10    Years: 10.00    Pack years: 1.00    Types: Cigarettes    Start date:  11/17/2013    Last attempt to quit: 09/09/2014    Years since quitting: 4.2  . Smokeless tobacco: Never Used  Substance Use Topics  . Alcohol use: Yes    Comment: Occasionally.  . Drug use: Yes    Frequency: 4.0 times per week    Types: Marijuana    REVIEW OF SYSTEMS: General: Generalized fatigue postoperatively, uncomfortable,, no weakness Head:  no headaches Eyes:  no blurred vision ENT:  no sore throat Neck:  no masses CV:  no chest pain, no orthopnea Lungs:  no shortness of breath, no cough GI:  no nausea or vomiting, no diarrhea GU:  no dysuria or hematuria Skin:  no rashes or lesions Neuro:  no focal numbness or weakness Psych:  no depression or anxiety    PHYSICAL EXAM:  Vitals:   12/16/18 1205 12/16/18 1234  BP: 122/69 135/75  Pulse: 72 72  Resp: 16 16  Temp: (!) 97.2 F (36.2 C) 97.9 F (36.6 C)  SpO2: 100% 98%   No intake/output data recorded.   General:  AAOx3 NAD, uncomfortable after surgery HEENT: MMM [place] AT anicteric sclera Neck:  No JVD, no adenopathy CV:  Heart RRR, left upper extremity AV fistula with a good thrill and bruit. Lungs:  L/S CTA bilaterally Abd:  abd SNT/ND with normal BS GU:  Bladder non-palpable Extremities:  No LE edema, left AKA Skin:  No skin rash Psych:  normal mood and affect Neuro:  no focal deficits   CURRENT MEDICATIONS:  . acetaminophen  1,000 mg Oral Q6H  . [START ON 12/17/2018] amLODipine  10 mg Oral Daily  . calcium acetate  1,334 mg Oral TID WC  . citalopram  20 mg Oral Daily  . docusate sodium  100 mg Oral BID  . [START ON 12/17/2018] dolutegravir  50 mg Oral Q breakfast   And  . [START ON 12/17/2018] rilpivirine  25 mg Oral Q breakfast  . furosemide  40 mg Oral BID  . gabapentin  300 mg Oral Daily  . insulin aspart  0-9 Units Subcutaneous TID WC  . [START ON 12/17/2018] insulin glargine  5 Units Subcutaneous QHS  . labetalol  300 mg Oral BID  . [START ON 12/17/2018] megestrol  40 mg Oral Daily  . nutrition  supplement (JUVEN)  1 packet Oral BID BM  . senna  2 tablet Oral BID  . sodium bicarbonate  650 mg Oral BID     HOME MEDICATIONS:  Prior to Admission medications   Medication Sig Start Date End Date Taking? Authorizing Provider  amLODipine (NORVASC) 10 MG tablet Take 1 tablet (10 mg total) by mouth daily. 12/24/17 10/27/27 Yes Rice, Resa Miner, MD  Aspirin-Salicylamide-Caffeine (BC HEADACHE POWDER PO) Take 1 packet by mouth as needed (for headaches).   Yes [provider]  calcitRIOL (ROCALTROL) 0.25 MCG capsule Take 1 capsule (0.25 mcg total) by mouth daily. 10/17/18  Yes [name], MD  calcium acetate (PHOSLO) 667 MG capsule  Take 2 capsules (1,334 mg total) by mouth 3 (three) times daily with meals. 10/16/18  Yes [name], MD  citalopram (CELEXA) 20 MG tablet Take 1 tablet (20 mg total) by mouth daily. 08/12/18 10/27/27 Yes Carroll Sage, MD  furosemide (LASIX) 40 MG tablet Take 1 tablet (40 mg total) by mouth 2 (two) times daily. 11/26/18  Yes Seawell, Jaimie A, DO  gabapentin (NEURONTIN) 300 MG capsule Take 1 capsule (300 mg total) by mouth 2 (two) times daily. 11/17/18  Yes Rayburn, Neta Mends, PA-C  Insulin Glargine (LANTUS) 100 UNIT/ML Solostar Pen Inject 5 Units into the skin at bedtime. 10/16/18  Yes [name], MD  JULUCA 50-25 MG TABS TAKE 1 TABLET BY MOUTH DAILY. PLEASE TAKE WITH LARGEST MEAL OF THE DAY. Patient taking differently: Take 1 tablet by mouth daily with breakfast.  11/11/18 02/09/19 Yes Chundi, Vahini, MD  labetalol (NORMODYNE) 300 MG tablet Take 1 tablet (300 mg total) by mouth 2 (two) times daily. 11/26/18  Yes Seawell, Jaimie A, DO  liraglutide (VICTOZA) 18 MG/3ML SOPN Inject 0.2 mLs (1.2 mg total) into the skin daily. Patient taking differently: Inject 1.2 mg into the skin at bedtime.  02/04/18  Yes Chundi, Vahini, MD  methocarbamol (ROBAXIN) 500 MG tablet Take 1 tablet (500 mg total) by mouth every 6 (six) hours as needed for muscle  spasms. 10/21/18  Yes [name], MD  nutrition supplement, JUVEN, (JUVEN) PACK Take 1 packet by mouth 2 (two) times daily between meals. 10/16/18  Yes [name], MD  oxyCODONE-acetaminophen (PERCOCET/ROXICET) 5-325 MG tablet Take 1 tablet by mouth every 6 (six) hours as needed for moderate pain or severe pain. 12/13/18  Yes [name], MD  polyethylene glycol [facility] / GLYCOLAX) packet Take 17 g by mouth daily. Patient taking differently: Take 17 g by mouth daily as needed for mild constipation.  08/12/18  Yes Carroll Sage, MD  silver sulfADIAZINE (SILVADENE) 1 % cream Apply 1 application topically daily. Patient taking differently: Apply 1 application topically daily. LEGS 11/17/18  Yes Rayburn, Neta Mends, PA-C  sodium bicarbonate 650 MG tablet Take 2 tablets (1,300 mg total) by mouth 2 (two) times daily. Patient taking differently: Take 650 mg by mouth 2 (two) times daily.  10/16/18  Yes Harbrecht, Purcell Nails, MD  glucose blood (ACCU-CHEK GUIDE) test strip USE TO TEST BLOOD SUGAR 3 TIMES DAILY . Diagnosis code  E11.69, E66.9 07/09/17   Lars Mage, MD  megestrol (MEGACE) 40 MG tablet Take 1 tablet (40 mg total) by mouth daily. 10/21/18   [name], MD  ondansetron (ZOFRAN) 4 MG tablet Take 1 tablet (4 mg total) by mouth daily as needed for nausea or vomiting. 08/19/18 08/19/19  Alphonzo Grieve, MD  senna (SENOKOT) 8.6 MG TABS tablet Take 2 tablets (17.2 mg total) by mouth 2 (two) times daily. 08/12/18   Carroll Sage, MD       LABS:  CBC Latest Ref Rng & Units 12/16/2018 11/26/2018 11/25/2018  WBC 4.0 - 10.5 K/uL 7.0 5.7 -  Hemoglobin 12.0 - 15.0 g/dL 9.4(L) 7.3(L) 7.3(L)  Hematocrit 36.0 - 46.0 % 30.7(L) 23.2(L) 22.7(L)  Platelets 150 - 400 K/uL 282 177 -    CMP Latest Ref Rng & Units 12/16/2018 11/26/2018 11/25/2018  Glucose 70 - 99 mg/dL 130(H) 100(H) 127(H)  BUN 6 - 20 mg/dL 31(H) 42(H) 43(H)  Creatinine 0.44 - 1.00 mg/dL 6.27(H) 6.19(H) 5.71(H)  Sodium 135 -  145 mmol/L 134(L) 138 136  Potassium 3.5 -  5.1 mmol/L 4.7 4.9 5.0  Chloride 98 - 111 mmol/L 109 108 112(H)  CO2 22 - 32 mmol/L 13(L) 18(L) 14(L)  Calcium 8.9 - 10.3 mg/dL 8.8(L) 8.5(L) 8.2(L)  Total Protein 6.5 - 8.1 g/dL 8.9(H) - -  Total Bilirubin 0.3 - 1.2 mg/dL 0.5 - -  Alkaline Phos 38 - 126 U/L 120 - -  AST 15 - 41 U/L 18 - -  ALT 0 - 44 U/L 8 - -    Lab Results  Component Value Date   PTH 210 (H) 11/25/2018   PTH Comment 11/25/2018   CALCIUM 8.8 (L) 12/16/2018   CAION 1.04 (L) 10/07/2018   PHOS 4.6 11/26/2018       Component Value Date/Time   COLORURINE YELLOW 07/02/2018 2158   APPEARANCEUR HAZY (A) 07/02/2018 2158   APPEARANCEUR Cloudy (A) 04/23/2016 1530   LABSPEC 1.013 07/02/2018 2158   PHURINE 5.0 07/02/2018 2158   GLUCOSEU 50 (A) 07/02/2018 2158   HGBUR MODERATE (A) 07/02/2018 2158   BILIRUBINUR NEGATIVE 07/02/2018 2158   BILIRUBINUR Negative 04/23/2016 1530   KETONESUR NEGATIVE 07/02/2018 2158   PROTEINUR 100 (A) 07/02/2018 2158   UROBILINOGEN 0.2 05/30/2015 1316   NITRITE NEGATIVE 07/02/2018 2158   LEUKOCYTESUR NEGATIVE 07/02/2018 2158   LEUKOCYTESUR Negative 04/23/2016 1530      Component Value Date/Time   HCO3 14.5 (L) 10/07/2018 0403   TCO2 15 (L) 10/07/2018 0403   ACIDBASEDEF 11.0 (H) 10/07/2018 0403   O2SAT 94.0 10/07/2018 0403       Component Value Date/Time   IRON 47 11/25/2018 0309   IRON 68 02/04/2018 1705   TIBC 210 (L) 11/25/2018 0309   FERRITIN 265 11/25/2018 0309   FERRITIN 27 02/04/2018 1705   IRONPCTSAT 22 11/25/2018 0309       ASSESSMENT/PLAN:    43 year old female patient who presented with 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  She has underlying chronic kidney disease stage V.  She has underlying type 2 diabetes.  She presented for a left above-the-knee amputation.  1.  Chronic kidney disease stage V.  She sees Dr. Posey Pronto in the outpatient clinic.  She likely has underlying diabetes and hypertension is  the cause of her renal failure, but certainly could have underlying HIV-associated nephropathy as well.  No urgent indication for dialysis, but suspect will need about dialysis in the near future.  AV fistula does not appear to be ready for use just yet.  2.  Status post left above-the-knee amputation.  Surgery following and managing.  3.  Secondary hyperparathyroidism.  Continue PhosLo.  PTH is appropriate for CKD stage.  4.  Anemia of chronic kidney disease.  Iron and ferritin are appropriate.  Transfuse PRN.  5.  Type 2 diabetes.  Continue outpatient therapy.  6.  Hypertension.  Continue pre-surgery medications.  7.  HIV.  Continue HAART therapy.    [place], DO, [place]

## 2018-12-16 NOTE — H&P (Signed)
Kathy Frank is an 43 y.o. female.   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  She has a history of stage V chronic kidney disease and recently had an AV fistula placed for access for hemodialysis.  She also has type 2 diabetes.  She is failed to heal and presents for revision surgery with a planned left above-the-knee amputation.  Past Medical History:  Diagnosis Date  . CKD (chronic kidney disease) stage 5, GFR less than 15 ml/min (HCC) 06/19/2016   06/30/16: Seen by Dr. Posey Pronto [Nephrology]. Suspect etiologies to include diabetes and hypertension though cannot exclude HIV-associated nephropathy. Advised her against from using excessive Goody powder.  . Contact dermatitis 04/21/2016   07/29/16: Skin biopsy performed by [facility] notable for acute spongiotic dermatitis consistent with contact dermatitis.  . Cyclical vomiting    THC induced???  . Depression 06/28/2006   Qualifier: Diagnosis of  By: Riccardo Dubin MD, [place]    . Diabetes mellitus type 2 in obese ([place]) 06/28/1994  . DKA (diabetic ketoacidoses) ([place]) 12/2014  . Erosive esophagitis   . Esophageal reflux   . Essential hypertension 11/16/2013  . Gastroparesis    ? diabetic  . GERD (gastroesophageal reflux disease)   . History of blood transfusion   . Human immunodeficiency virus (HIV) disease ([place]) 04/23/2016  . Hypertension   . Moderate nonproliferative diabetic retinopathy of both eyes ([place]) 11/21/2014   11/14/14: Noted on retinal imaging; needs follow-up imaging in 6 months  05/22/16: Noted on retinal imaging again; needs follow-up imaging in 6 months  . Normocytic anemia   . TOBACCO USER 06/28/2006   Qualifier: Diagnosis of  By: Riccardo Dubin MD, Sherren Mocha    . Type II diabetes mellitus ([place]) 1994   diagnosed around 1994    Past Surgical History:  Procedure Laterality Date  .  AMPUTATION Left 07/08/2018   Procedure: AMPUTATION FORTH RAY LEFT FOOT;  Surgeon: [name], MD;  Location: [facility];  Service: Orthopedics;  Laterality: Left;  . AMPUTATION Left 08/09/2018   Procedure: Left Transmetatarsal Amputation;  Surgeon: [name], MD;  Location: [facility];  Service: Orthopedics;  Laterality: Left;  . AMPUTATION Left 10/08/2018   Procedure: LEFT BELOW KNEE AMPUTATION;  Surgeon: [name], MD;  Location: [facility];  Service: Orthopedics;  Laterality: Left;  . AMPUTATION Left 10/28/2018   Procedure: REVISION BELOW KNEE AMPUTATION;  Surgeon: [name], MD;  Location: [facility];  Service: Orthopedics;  Laterality: Left;  . AV FISTULA PLACEMENT Left 10/14/2018   Procedure: Arteriovenous (Av) Fistula Creation Left Arm;  Surgeon: [name], MD;  Location: [facility];  Service: Vascular;  Laterality: Left;  . LOWER EXTREMITY ANGIOGRAPHY N/A 07/05/2018   Procedure: LOWER EXTREMITY ANGIOGRAPHY;  Surgeon: Serafina Mitchell, MD;  Location: [facility];  Service: Cardiovascular;  Laterality: N/A;  . PERIPHERAL VASCULAR BALLOON ANGIOPLASTY Left 07/05/2018   Procedure: PERIPHERAL VASCULAR BALLOON ANGIOPLASTY;  Surgeon: Serafina Mitchell, MD;  Location: [facility];  Service: Cardiovascular;  Laterality: Left;  SFA  . STUMP REVISION Left 10/19/2018   Procedure: REVISION LEFT BELOW KNEE AMPUTATION;  Surgeon: [name], MD;  Location: [facility];  Service: Orthopedics;  Laterality: Left;  . TUBAL LIGATION  2002    Family History  Problem Relation Age of Onset  . Diabetes Mother   . Diabetes Brother   .  Diabetes Daughter   . Diabetes Daughter   . Mental retardation Brother        died from PNA  . Diabetes Maternal Grandmother    Social History:  reports that she quit smoking about 4 years ago. Her smoking use included cigarettes. She started smoking about 5 years ago. She has a 1.00 pack-year smoking history. She has never used smokeless tobacco. She reports  current alcohol use. She reports current drug use. Frequency: 4.00 times per week. Drug: Marijuana.  Allergies: No Known Allergies  Medications Prior to Admission  Medication Sig Dispense Refill  . amLODipine (NORVASC) 10 MG tablet Take 1 tablet (10 mg total) by mouth daily. 30 tablet 2  . Aspirin-Salicylamide-Caffeine (BC HEADACHE POWDER PO) Take 1 packet by mouth as needed (for headaches).    . calcitRIOL (ROCALTROL) 0.25 MCG capsule Take 1 capsule (0.25 mcg total) by mouth daily. 30 capsule 1  . calcium acetate (PHOSLO) 667 MG capsule Take 2 capsules (1,334 mg total) by mouth 3 (three) times daily with meals. 180 capsule 2  . citalopram (CELEXA) 20 MG tablet Take 1 tablet (20 mg total) by mouth daily. 30 tablet 0  . furosemide (LASIX) 40 MG tablet Take 1 tablet (40 mg total) by mouth 2 (two) times daily. 60 tablet 0  . gabapentin (NEURONTIN) 300 MG capsule Take 1 capsule (300 mg total) by mouth 2 (two) times daily. 90 capsule 3  . Insulin Glargine (LANTUS) 100 UNIT/ML Solostar Pen Inject 5 Units into the skin at bedtime. 3 mL 0  . JULUCA 50-25 MG TABS TAKE 1 TABLET BY MOUTH DAILY. PLEASE TAKE WITH LARGEST MEAL OF THE DAY. (Patient taking differently: Take 1 tablet by mouth daily with breakfast. ) 90 tablet 0  . labetalol (NORMODYNE) 300 MG tablet Take 1 tablet (300 mg total) by mouth 2 (two) times daily. 60 tablet 0  . liraglutide (VICTOZA) 18 MG/3ML SOPN Inject 0.2 mLs (1.2 mg total) into the skin daily. (Patient taking differently: Inject 1.2 mg into the skin at bedtime. ) 3 mL 1  . Darbepoetin Alfa (ARANESP) 60 MCG/0.3ML SOSY injection Inject 0.3 mLs (60 mcg total) into the skin every Tuesday at 6 PM. 4.2 mL 0  . glucose blood (ACCU-CHEK GUIDE) test strip USE TO TEST BLOOD SUGAR 3 TIMES DAILY . Diagnosis code  E11.69, E66.9 100 each 11  . megestrol (MEGACE) 40 MG tablet Take 1 tablet (40 mg total) by mouth daily. 30 tablet 1  . methocarbamol (ROBAXIN) 500 MG tablet Take 1 tablet (500 mg  total) by mouth every 6 (six) hours as needed for muscle spasms. 20 tablet 0  . nutrition supplement, JUVEN, (JUVEN) PACK Take 1 packet by mouth 2 (two) times daily between meals. 60 packet 3  . ondansetron (ZOFRAN) 4 MG tablet Take 1 tablet (4 mg total) by mouth daily as needed for nausea or vomiting. 30 tablet 0  . oxyCODONE-acetaminophen (PERCOCET/ROXICET) 5-325 MG tablet Take 1 tablet by mouth every 6 (six) hours as needed for moderate pain or severe pain. 28 tablet 0  . polyethylene glycol (MIRALAX / GLYCOLAX) packet Take 17 g by mouth daily. (Patient taking differently: Take 17 g by mouth daily as needed for mild constipation. ) 14 each 0  . senna (SENOKOT) 8.6 MG TABS tablet Take 2 tablets (17.2 mg total) by mouth 2 (two) times daily. 30 each 0  . silver sulfADIAZINE (SILVADENE) 1 % cream Apply 1 application topically daily. (Patient taking differently: Apply 1 application topically  daily. LEGS) 50 g 0  . sodium bicarbonate 650 MG tablet Take 2 tablets (1,300 mg total) by mouth 2 (two) times daily. 120 tablet 2    Results for orders placed or performed during the hospital encounter of 12/16/18 (from the past 48 hour(s))  Glucose, capillary     Status: Abnormal   Collection Time: 12/16/18  6:34 AM  Result Value Ref Range   Glucose-Capillary 135 (H) 70 - 99 mg/dL   Comment 1 Notify RN    No results found.  Review of Systems  All other systems reviewed and are negative.   Blood pressure 140/65, pulse 81, temperature 97.6 F (36.4 C), temperature source Oral, resp. rate 18, height 5\' 5"  (1.651 m), weight 74.8 kg, last menstrual period 12/08/2018, SpO2 100 %. Physical Exam  Constitutional: She is oriented to person, place, and time. She appears well-developed and well-nourished. No distress.  HENT:  Head: Normocephalic and atraumatic.  Neck: No tracheal deviation present. No thyromegaly present.  Cardiovascular: Normal rate.  Respiratory: Effort normal. No stridor. No respiratory  distress.  GI: Soft. She exhibits no distension.  Musculoskeletal:     Comments: The left transtibial amputation site has dehisced and has foul smelling drainage.  There is significant necrotic tissue and the area tracks deeply likely to the tibia.  The sutures are under tension.  She is tender to palpation.  Neurological: She is alert and oriented to person, place, and time. No cranial nerve deficit. Coordination normal.  Skin: Skin is warm.  Psychiatric: She has a normal mood and affect. Her behavior is normal. Judgment and thought content normal.     Assessment/Plan Dehiscence left transtibial amputation site-plan left above-the-knee amputation-the procedure and benefits and risks were discussed with the patient including the risk of bleeding, infection, neurovascular injury, and possible need for further surgery.  The patient's questions were answered to her satisfaction and she desires to proceed at this time.  Erlinda Hong, PA-C 12/16/2018, 6:54 AM [facility] [phone]

## 2018-12-16 NOTE — Op Note (Signed)
12/16/2018  10:55 AM  PATIENT:  Kathy Frank    PRE-OPERATIVE DIAGNOSIS:  Dehiscence Left Below Knee Amputation  POST-OPERATIVE DIAGNOSIS:  Same  PROCEDURE:  LEFT ABOVE KNEE AMPUTATION Application of Praveena VAC both the customizable and restore VAC  SURGEON:  [name], MD  PHYSICIAN ASSISTANT:None ANESTHESIA:   General  PREOPERATIVE INDICATIONS:  Kathy Frank is a  43 y.o. female with a diagnosis of Dehiscence Left Below Knee Amputation who failed conservative measures and elected for surgical management.    The risks benefits and alternatives were discussed with the patient preoperatively including but not limited to the risks of infection, bleeding, nerve injury, cardiopulmonary complications, the need for revision surgery, among others, and the patient was willing to proceed.  OPERATIVE IMPLANTS: Praveena customizable and restore VAC  @ENCIMAGES @  OPERATIVE FINDINGS: Good color contractility consistency of the muscle at the amputation site  OPERATIVE PROCEDURE: Patient was brought the operating room underwent a general anesthetic.  After adequate levels anesthesia were obtained patient's left lower extremity was prepped using DuraPrep draped into a sterile field a timeout was called.  A fishmouth incision was made proximal to the patella this was carried sharply through the muscle.  The vascular bundle was clamped medially and suture ligated.  The amputation was completed the femur was resected with a reciprocating saw.  Electrocautery was used for further hemostasis.  The deep superficial fascia and skin was closed using 2-0 nylon.  The customizable and restore wound VAC sponges were applied this had a good suction fit patient was extubated taken the PACU in stable condition.   DISCHARGE PLANNING:  Antibiotic duration: 24 hours postoperatively  Weightbearing: Nonweightbearing on the left  Pain medication: Opioid pathway  Dressing care/ Wound VAC: Continue wound  VAC for 1 week  Ambulatory devices: Walker  Discharge to: Discharge to inpatient versus outpatient rehabilitation.  Follow-up: In the office 1 week post operative.

## 2018-12-17 ENCOUNTER — Encounter (HOSPITAL_COMMUNITY): Payer: Self-pay | Admitting: Orthopedic Surgery

## 2018-12-17 LAB — BASIC METABOLIC PANEL
Anion gap: 14 (ref 5–15)
Anion gap: 10 (ref 5–15)
BUN: 32 mg/dL — ABNORMAL HIGH (ref 6–20)
BUN: 32 mg/dL — ABNORMAL HIGH (ref 6–20)
CO2: 15 mmol/L — ABNORMAL LOW (ref 22–32)
CO2: 17 mmol/L — ABNORMAL LOW (ref 22–32)
Calcium: 8 mg/dL — ABNORMAL LOW (ref 8.9–10.3)
Calcium: 8.1 mg/dL — ABNORMAL LOW (ref 8.9–10.3)
Chloride: 111 mmol/L (ref 98–111)
Chloride: 111 mmol/L (ref 98–111)
Creatinine, Ser: 6.17 mg/dL — ABNORMAL HIGH (ref 0.44–1.00)
Creatinine, Ser: 6.1 mg/dL — ABNORMAL HIGH (ref 0.44–1.00)
GFR calc Af Amer: 9 mL/min — ABNORMAL LOW (ref >60)
GFR calc Af Amer: 9 mL/min — ABNORMAL LOW (ref >60)
GFR calc non Af Amer: 8 mL/min — ABNORMAL LOW (ref >60)
GFR calc non Af Amer: 8 mL/min — ABNORMAL LOW (ref >60)
Glucose, Bld: 164 mg/dL — ABNORMAL HIGH (ref 70–99)
Glucose, Bld: 155 mg/dL — ABNORMAL HIGH (ref 70–99)
Potassium: 4.3 mmol/L (ref 3.5–5.1)
Potassium: 4.3 mmol/L (ref 3.5–5.1)
Sodium: 140 mmol/L (ref 135–145)
Sodium: 138 mmol/L (ref 135–145)

## 2018-12-17 LAB — GLUCOSE, CAPILLARY
Glucose-Capillary: 145 mg/dL — ABNORMAL HIGH (ref 70–99)
Glucose-Capillary: 139 mg/dL — ABNORMAL HIGH (ref 70–99)
Glucose-Capillary: 128 mg/dL — ABNORMAL HIGH (ref 70–99)
Glucose-Capillary: 137 mg/dL — ABNORMAL HIGH (ref 70–99)

## 2018-12-17 MED ORDER — HYDRALAZINE HCL 20 MG/ML IJ SOLN
10.0000 mg | Freq: Three times a day (TID) | INTRAMUSCULAR | Status: DC | PRN
  Administered 2018-12-17: 11:00:00 10 mg via INTRAVENOUS
  Filled 2018-12-17: qty 1

## 2018-12-17 MED ORDER — STERILE WATER FOR INJECTION IV SOLN
INTRAVENOUS | Status: DC
  Administered 2018-12-17 – 2018-12-18 (×2): via INTRAVENOUS
  Filled 2018-12-17 (×3): qty 850

## 2018-12-17 MED ORDER — LABETALOL HCL 100 MG PO TABS
300.0000 mg | ORAL_TABLET | Freq: Once | ORAL | Status: AC
  Administered 2018-12-17: 300 mg via ORAL
  Filled 2018-12-17: qty 3

## 2018-12-17 MED ORDER — PROMETHAZINE HCL 25 MG/ML IJ SOLN
12.5000 mg | Freq: Four times a day (QID) | INTRAMUSCULAR | Status: DC | PRN
  Administered 2018-12-17: 12.5 mg via INTRAVENOUS
  Filled 2018-12-17: qty 1

## 2018-12-17 NOTE — Evaluation (Signed)
Physical Therapy Evaluation Patient Details Name: Kathy Frank MRN: [id number] DOB: [birth year] Today's Date: 12/17/2018   History of Present Illness  Pt is a 43 y/o female s/p L residual limb revision from transtibial to now transfemoral amputation. PMH including but not limited to DM, HTN, HIV, CKD.    Clinical Impression  Pt presented supine in bed with HOB flat, awake and with significant pain and nausea. Prior to admission, pt reported that she ambulated with use of a RW and was independent with ADLs. Pt lives with her grown children (two daughters) in a single level home. Pt stated that she will have 24/7 supervision/assistance upon d/c. At the time of evaluation, pt able to perform bed mobility at mod I level with use of bed rails. She was very limited secondary to nausea and pain in L residual limb. Pt's RN was notified. Pt would continue to benefit from skilled physical therapy services at this time while admitted and after d/c to address the below listed limitations in order to improve overall safety and independence with functional mobility.     Follow Up Recommendations Home health PT;Supervision - Intermittent    Equipment Recommendations  None recommended by PT    Recommendations for Other Services       Precautions / Restrictions Precautions Precautions: Fall Precaution Comments: wound VAC Restrictions Weight Bearing Restrictions: Yes LLE Weight Bearing: Non weight bearing      Mobility  Bed Mobility Overal bed mobility: Modified Independent                Transfers                 General transfer comment: deferred secondary to significant pain and nausea  Ambulation/Gait                Stairs            Wheelchair Mobility    Modified Rankin (Stroke Patients Only)       Balance Overall balance assessment: Needs assistance Sitting-balance support: No upper extremity supported Sitting balance-Leahy Scale: Fair                                        Pertinent Vitals/Pain Pain Assessment: Faces Faces Pain Scale: Hurts worst Pain Location: distal aspect of L residual limb Pain Descriptors / Indicators: Crying Pain Intervention(s): Monitored during session;Repositioned;Patient requesting pain meds-RN notified    Home Living Family/patient expects to be discharged to:: Private residence Living Arrangements: Children Available Help at Discharge: Family Type of Home: House Home Access: Ramped entrance     Home Layout: One level Home Equipment: Environmental consultant - 2 wheels;Bedside commode;Wheelchair - manual;Tub bench      Prior Function Level of Independence: Independent with assistive device(s)         Comments: amb with RW. No assist with ADLs.     Hand Dominance        Extremity/Trunk Assessment   Upper Extremity Assessment Upper Extremity Assessment: Overall WFL for tasks assessed    Lower Extremity Assessment Lower Extremity Assessment: LLE deficits/detail LLE Deficits / Details: wound VAC in place; pt requiring assistance from bilateral UEs to move L residual limb through hip ROM LLE: Unable to fully assess due to pain       Communication   Communication: No difficulties  Cognition Arousal/Alertness: Awake/alert Behavior During Therapy: WFL for tasks assessed/performed Overall Cognitive Status:  Within Functional Limits for tasks assessed                                        General Comments      Exercises     Assessment/Plan    PT Assessment Patient needs continued PT services  PT Problem List Decreased strength;Decreased activity tolerance;Decreased balance;Decreased mobility;Decreased coordination;Pain       PT Treatment Interventions DME instruction;Gait training;Functional mobility training;Therapeutic activities;Balance training;Therapeutic exercise;Neuromuscular re-education;Patient/family education    PT Goals (Current goals can be found  in the Care Plan section)  Acute Rehab PT Goals Patient Stated Goal: decrease pain PT Goal Formulation: With patient Time For Goal Achievement: 12/31/18 Potential to Achieve Goals: Good    Frequency Min 5X/week   Barriers to discharge        Co-evaluation               AM-PAC PT "6 Clicks" Mobility  Outcome Measure Help needed turning from your back to your side while in a flat bed without using bedrails?: None Help needed moving from lying on your back to sitting on the side of a flat bed without using bedrails?: None Help needed moving to and from a bed to a chair (including a wheelchair)?: A Little Help needed standing up from a chair using your arms (e.g., wheelchair or bedside chair)?: A Little Help needed to walk in hospital room?: A Little Help needed climbing 3-5 steps with a railing? : A Lot 6 Click Score: 19    End of Session   Activity Tolerance: Patient limited by pain;Other (comment)(limited secondary to nausea) Patient left: in bed;with call bell/phone within reach Nurse Communication: Mobility status;Patient requests pain meds PT Visit Diagnosis: Other abnormalities of gait and mobility (R26.89)    Time: 9480-1655 PT Time Calculation (min) (ACUTE ONLY): 14 min   Charges:   PT Evaluation $PT Eval Moderate Complexity: 1 Mod          Sherie Don, PT, DPT  Acute Rehabilitation Services Pager [phone] Office [place] 12/17/2018, 11:58 AM

## 2018-12-17 NOTE — Progress Notes (Signed)
OT Cancellation Note  Patient Details Name: Kathy Frank MRN: [id number] DOB: 1976/05/03   Cancelled Treatment:    Reason Eval/Treat Not Completed: Fatigue/lethargy limiting ability to participate upon arrival pt sleeping in bed. Pt awoke to stimuli, politely declined therapy this session, due to fatigue from morning of persistent n/v. OT will return at later time when appropriate.   Dorinda Hill OTR/L Acute Rehabilitation Services Office: [place] 12/17/2018, 4:00 PM

## 2018-12-17 NOTE — Progress Notes (Signed)
[facility]    NEPHROLOGY PROGRESS NOTE  SUBJECTIVE: Patient with vomiting this morning and overnight postanesthesia.  Denies chest pain, shortness of breath, dysuria, skin rash, or headache.  Complaining of pain at her stump.  All other review of systems are negative.   OBJECTIVE:  Vitals:   12/17/18 0616 12/17/18 0953  BP: (!) 164/80 (!) 177/85  Pulse: 78 99  Resp: 19 18  Temp: 99.4 F (37.4 C) 98 F (36.7 C)  SpO2: 97% 95%    Intake/Output Summary (Last 24 hours) at 12/17/2018 1142 Last data filed at 12/17/2018 4174 Gross per 24 hour  Intake 0 ml  Output 300 ml  Net -300 ml      General:  AAOx3 NAD HEENT: MMM [place] AT anicteric sclera Neck:  No JVD, no adenopathy CV:  Heart RRR  Lungs:  L/S CTA bilaterally Abd:  abd SNT/ND with normal BS GU:  Bladder non-palpable Extremities:  No LE edema. Skin:  No skin rash  MEDICATIONS:  . acetaminophen  1,000 mg Oral Q6H  . amLODipine  10 mg Oral Daily  . calcium acetate  1,334 mg Oral TID WC  . citalopram  20 mg Oral Daily  . docusate sodium  100 mg Oral BID  . dolutegravir  50 mg Oral Q breakfast   And  . rilpivirine  25 mg Oral Q breakfast  . gabapentin  300 mg Oral Daily  . insulin aspart  0-9 Units Subcutaneous TID WC  . insulin glargine  5 Units Subcutaneous QHS  . labetalol  300 mg Oral BID  . megestrol  40 mg Oral Daily  . nutrition supplement (JUVEN)  1 packet Oral BID BM  . senna  2 tablet Oral BID       LABS:   CBC Latest Ref Rng & Units 12/16/2018 11/26/2018 11/25/2018  WBC 4.0 - 10.5 K/uL 7.0 5.7 -  Hemoglobin 12.0 - 15.0 g/dL 9.4(L) 7.3(L) 7.3(L)  Hematocrit 36.0 - 46.0 % 30.7(L) 23.2(L) 22.7(L)  Platelets 150 - 400 K/uL 282 177 -    CMP Latest Ref Rng & Units 12/17/2018 12/16/2018 11/26/2018  Glucose 70 - 99 mg/dL 164(H) 130(H) 100(H)  BUN 6 - 20 mg/dL 32(H) 31(H) 42(H)  Creatinine 0.44 - 1.00 mg/dL 6.17(H) 6.27(H) 6.19(H)  Sodium 135 - 145 mmol/L 140 134(L) 138  Potassium 3.5 - 5.1 mmol/L  4.3 4.7 4.9  Chloride 98 - 111 mmol/L 111 109 108  CO2 22 - 32 mmol/L 15(L) 13(L) 18(L)  Calcium 8.9 - 10.3 mg/dL 8.0(L) 8.8(L) 8.5(L)  Total Protein 6.5 - 8.1 g/dL - 8.9(H) -  Total Bilirubin 0.3 - 1.2 mg/dL - 0.5 -  Alkaline Phos 38 - 126 U/L - 120 -  AST 15 - 41 U/L - 18 -  ALT 0 - 44 U/L - 8 -    Lab Results  Component Value Date   PTH 210 (H) 11/25/2018   PTH Comment 11/25/2018   CALCIUM 8.0 (L) 12/17/2018   CAION 1.04 (L) 10/07/2018   PHOS 4.6 11/26/2018       Component Value Date/Time   COLORURINE YELLOW 07/02/2018 2158   APPEARANCEUR HAZY (A) 07/02/2018 2158   APPEARANCEUR Cloudy (A) 04/23/2016 1530   LABSPEC 1.013 07/02/2018 2158   PHURINE 5.0 07/02/2018 2158   GLUCOSEU 50 (A) 07/02/2018 2158   HGBUR MODERATE (A) 07/02/2018 2158   BILIRUBINUR NEGATIVE 07/02/2018 2158   BILIRUBINUR Negative 04/23/2016 1530   KETONESUR NEGATIVE 07/02/2018 2158   PROTEINUR 100 (A)  07/02/2018 2158   UROBILINOGEN 0.2 05/30/2015 1316   NITRITE NEGATIVE 07/02/2018 2158   LEUKOCYTESUR NEGATIVE 07/02/2018 2158   LEUKOCYTESUR Negative 04/23/2016 1530      Component Value Date/Time   HCO3 14.5 (L) 10/07/2018 0403   TCO2 15 (L) 10/07/2018 0403   ACIDBASEDEF 11.0 (H) 10/07/2018 0403   O2SAT 94.0 10/07/2018 0403       Component Value Date/Time   IRON 47 11/25/2018 0309   IRON 68 02/04/2018 1705   TIBC 210 (L) 11/25/2018 0309   FERRITIN 265 11/25/2018 0309   FERRITIN 27 02/04/2018 1705   IRONPCTSAT 22 11/25/2018 0309       ASSESSMENT/PLAN:    43 year old female patient who presented with 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  She has underlying chronic kidney disease stage V.  She has underlying type 2 diabetes.  She presented for a left above-the-knee amputation.  1.  Chronic kidney disease stage V.  She sees Dr. Posey Pronto in the outpatient clinic.  She likely has underlying diabetes and hypertension is the cause of her renal failure, but certainly could have  underlying HIV-associated nephropathy as well.  No urgent indication for dialysis, but suspect will need about dialysis in the near future.    Has significant vomiting today although she has underlying gastroparesis and cyclic vomiting.  AV fistula does not appear to be ready for use just yet.  Will transition bicarbonate to IV.  BUN of 30 does not suggest significant uremia.  However, if she is not improving in the next 24 hours, we will likely need to initiate hemodialysis.  2.  Status post left above-the-knee amputation.  Surgery following and managing.  3.  Secondary hyperparathyroidism.  Continue PhosLo.  PTH is appropriate for CKD stage.  4.  Anemia of chronic kidney disease.  Iron and ferritin are appropriate.  Transfuse PRN.  5.  Type 2 diabetes.  Continue outpatient therapy.  6.  Hypertension.  Continue pre-surgery medications.  7.  HIV.  Continue HAART therapy.  8.  Diabetic gastroparesis.  She does also have a diagnosis of cyclic vomiting.  Likely aggravated by anesthesia and pain medications.  9.  Chronic metabolic acidosis.  Monitor BMP today.  Will change to IV bicarbonate.  Monitor blood sugars closely.  Continue insulin.  No significant anion gap.  [place], DO, [place]

## 2018-12-17 NOTE — Progress Notes (Signed)
Received call from patient's mother. Patient's mother is not on contact list. Told patient she would need to either add her to the list or call her and she stated she would call her mother later today.

## 2018-12-17 NOTE — Plan of Care (Signed)
  Problem: Clinical Measurements: Goal: Will remain free from infection Outcome: Progressing   Problem: Coping: Goal: Level of anxiety will decrease Outcome: Progressing   

## 2018-12-17 NOTE — Progress Notes (Signed)
OT Cancellation Note  Patient Details Name: NARIYAH OSIAS MRN: [id number] DOB: Nov 08, 1975   Cancelled Treatment:    Reason Eval/Treat Not Completed: Medical issues which prohibited therapy per nsg, pt not appropriate for therapy at this time, due to persistent n/v and elevate BP. OT will return at later time if time allows and pt is appropriate.   Dorinda Hill OTR/L Acute Rehabilitation Services Office: [place] 12/17/2018, 11:05 AM

## 2018-12-17 NOTE — Progress Notes (Addendum)
Patient's BP elevated and persistnent n/v despite prn Zofran and Reglan administered. Dr. Sharol Given paged, awaiting return call.  [phone]- Paged on call- Dr. Alphonzo Severance.

## 2018-12-17 NOTE — Progress Notes (Signed)
Patient ID: Kathy Frank, female   DOB: [birth year], 43 y.o.   MRN: [id number] Patient has had a few episodes of nausea and vomiting most likely due to the diabetic gastroparesis and narcotics.  There is no drainage in the wound VAC canister.  Physical therapy for transfers evaluate for discharge to skilled nursing.

## 2018-12-17 NOTE — Progress Notes (Signed)
Notified MD on call about pt SBP of 196 then 188. Notified that patient has vomited her night time meds including the labetalol 300mg  tablets. Nausea meds given IVP. MD instructed to give labetalol 300mg  to patient again.

## 2018-12-18 LAB — BASIC METABOLIC PANEL
Anion gap: 13 (ref 5–15)
BUN: 35 mg/dL — ABNORMAL HIGH (ref 6–20)
CO2: 22 mmol/L (ref 22–32)
Calcium: 7.8 mg/dL — ABNORMAL LOW (ref 8.9–10.3)
Chloride: 104 mmol/L (ref 98–111)
Creatinine, Ser: 6.13 mg/dL — ABNORMAL HIGH (ref 0.44–1.00)
GFR calc Af Amer: 9 mL/min — ABNORMAL LOW (ref >60)
GFR calc non Af Amer: 8 mL/min — ABNORMAL LOW (ref >60)
Glucose, Bld: 100 mg/dL — ABNORMAL HIGH (ref 70–99)
Potassium: 3.8 mmol/L (ref 3.5–5.1)
Sodium: 139 mmol/L (ref 135–145)

## 2018-12-18 LAB — GLUCOSE, CAPILLARY
Glucose-Capillary: 107 mg/dL — ABNORMAL HIGH (ref 70–99)
Glucose-Capillary: 128 mg/dL — ABNORMAL HIGH (ref 70–99)
Glucose-Capillary: 137 mg/dL — ABNORMAL HIGH (ref 70–99)
Glucose-Capillary: 117 mg/dL — ABNORMAL HIGH (ref 70–99)

## 2018-12-18 MED ORDER — CALCITRIOL 0.25 MCG PO CAPS
0.2500 ug | ORAL_CAPSULE | ORAL | Status: DC
  Administered 2018-12-19: 0.25 ug via ORAL
  Filled 2018-12-18: qty 1

## 2018-12-18 MED ORDER — CALCIUM CARBONATE ANTACID 500 MG PO CHEW: 1.0000 | CHEWABLE_TABLET | Freq: Two times a day (BID) | ORAL | Status: DC

## 2018-12-18 MED ORDER — SODIUM BICARBONATE 650 MG PO TABS
650.0000 mg | ORAL_TABLET | Freq: Two times a day (BID) | ORAL | Status: DC
  Administered 2018-12-18 – 2018-12-20 (×5): 650 mg via ORAL
  Filled 2018-12-18 (×6): qty 1

## 2018-12-18 NOTE — Progress Notes (Signed)
  Subjective: Pt stable - pain ok   Objective: Vital signs in last 24 hours: Temp:  [98.8 F (37.1 C)-99.8 F (37.7 C)] 98.8 F (37.1 C) (05/10 0445) Pulse Rate:  [92-99] 93 (05/10 0445) Resp:  [16-18] 16 (05/10 0445) BP: (140-178)/(63-81) 167/77 (05/10 0445) SpO2:  [99 %-100 %] 99 % (05/10 0445)  Intake/Output from previous day: 05/09 0701 - 05/10 0700 In: 236.2 [P.O.:60; I.V.:176.2] Out: 0  Intake/Output this shift: No intake/output data recorded.  Exam:  No cellulitis present Compartment soft  Labs: Recent Labs    12/16/18 0816  HGB 9.4*   Recent Labs    12/16/18 0816  WBC 7.0  RBC 3.11*  HCT 30.7*  PLT 282   Recent Labs    12/17/18 1401 12/18/18 0925  NA 138 139  K 4.3 3.8  CL 111 104  CO2 17* 22  BUN 32* 35*  CREATININE 6.10* 6.13*  GLUCOSE 155* 100*  CALCIUM 8.1* 7.8*   No results for input(s): LABPT, INR in the last 72 hours.  Assessment/Plan: Plan for PT today for transfers then dc to snf this week   Kathy Frank 12/18/2018, 10:12 AM

## 2018-12-18 NOTE — Plan of Care (Signed)
  Problem: Education: Goal: Knowledge of General Education information will improve Description: Including pain rating scale, medication(s)/side effects and non-pharmacologic comfort measures Outcome: Progressing   Problem: Nutrition: Goal: Adequate nutrition will be maintained Outcome: Progressing   Problem: Pain Managment: Goal: General experience of comfort will improve Outcome: Progressing   Problem: Skin Integrity: Goal: Risk for impaired skin integrity will decrease Outcome: Progressing   

## 2018-12-18 NOTE — Progress Notes (Signed)
[facility]    NEPHROLOGY PROGRESS NOTE  SUBJECTIVE: Patient w patient sitting up in the chair reporting she is feeling much better.  Denies any further nausea or vomiting.  Did eat a small amount of dinner last night and was hungry this morning.  Denies chest pain, shortness of breath, dysuria, skin rash, or headache.  Complaining of pain at her stump.  All other review of systems are negative.   OBJECTIVE:  Vitals:   12/17/18 2123 12/18/18 0445  BP: 140/73 (!) 167/77  Pulse: 97 93  Resp: 17 16  Temp: 99.8 F (37.7 C) 98.8 F (37.1 C)  SpO2: 100% 99%    Intake/Output Summary (Last 24 hours) at 12/18/2018 1236 Last data filed at 12/17/2018 1850 Gross per 24 hour  Intake 236.16 ml  Output 0 ml  Net 236.16 ml      General:  AAOx3 NAD HEENT: MMM [place] AT anicteric sclera Neck:  No JVD, no adenopathy CV:  Heart RRR  Lungs:  L/S CTA bilaterally Abd:  abd SNT/ND with normal BS GU:  Bladder non-palpable Extremities:  No LE edema. Skin:  No skin rash  MEDICATIONS:  . amLODipine  10 mg Oral Daily  . calcium acetate  1,334 mg Oral TID WC  . calcium carbonate  1 tablet Oral BID WC  . citalopram  20 mg Oral Daily  . docusate sodium  100 mg Oral BID  . dolutegravir  50 mg Oral Q breakfast   And  . rilpivirine  25 mg Oral Q breakfast  . gabapentin  300 mg Oral Daily  . insulin aspart  0-9 Units Subcutaneous TID WC  . insulin glargine  5 Units Subcutaneous QHS  . labetalol  300 mg Oral BID  . megestrol  40 mg Oral Daily  . nutrition supplement (JUVEN)  1 packet Oral BID BM  . senna  2 tablet Oral BID       LABS:   CBC Latest Ref Rng & Units 12/16/2018 11/26/2018 11/25/2018  WBC 4.0 - 10.5 K/uL 7.0 5.7 -  Hemoglobin 12.0 - 15.0 g/dL 9.4(L) 7.3(L) 7.3(L)  Hematocrit 36.0 - 46.0 % 30.7(L) 23.2(L) 22.7(L)  Platelets 150 - 400 K/uL 282 177 -    CMP Latest Ref Rng & Units 12/18/2018 12/17/2018 12/17/2018  Glucose 70 - 99 mg/dL 100(H) 155(H) 164(H)  BUN 6 - 20 mg/dL  35(H) 32(H) 32(H)  Creatinine 0.44 - 1.00 mg/dL 6.13(H) 6.10(H) 6.17(H)  Sodium 135 - 145 mmol/L 139 138 140  Potassium 3.5 - 5.1 mmol/L 3.8 4.3 4.3  Chloride 98 - 111 mmol/L 104 111 111  CO2 22 - 32 mmol/L 22 17(L) 15(L)  Calcium 8.9 - 10.3 mg/dL 7.8(L) 8.1(L) 8.0(L)  Total Protein 6.5 - 8.1 g/dL - - -  Total Bilirubin 0.3 - 1.2 mg/dL - - -  Alkaline Phos 38 - 126 U/L - - -  AST 15 - 41 U/L - - -  ALT 0 - 44 U/L - - -    Lab Results  Component Value Date   PTH 210 (H) 11/25/2018   PTH Comment 11/25/2018   CALCIUM 7.8 (L) 12/18/2018   CAION 1.04 (L) 10/07/2018   PHOS 4.6 11/26/2018       Component Value Date/Time   COLORURINE YELLOW 07/02/2018 2158   APPEARANCEUR HAZY (A) 07/02/2018 2158   APPEARANCEUR Cloudy (A) 04/23/2016 1530   LABSPEC 1.013 07/02/2018 2158   PHURINE 5.0 07/02/2018 2158   GLUCOSEU 50 (A) 07/02/2018 2158  HGBUR MODERATE (A) 07/02/2018 2158   BILIRUBINUR NEGATIVE 07/02/2018 2158   BILIRUBINUR Negative 04/23/2016 1530   KETONESUR NEGATIVE 07/02/2018 2158   PROTEINUR 100 (A) 07/02/2018 2158   UROBILINOGEN 0.2 05/30/2015 1316   NITRITE NEGATIVE 07/02/2018 2158   LEUKOCYTESUR NEGATIVE 07/02/2018 2158   LEUKOCYTESUR Negative 04/23/2016 1530      Component Value Date/Time   HCO3 14.5 (L) 10/07/2018 0403   TCO2 15 (L) 10/07/2018 0403   ACIDBASEDEF 11.0 (H) 10/07/2018 0403   O2SAT 94.0 10/07/2018 0403       Component Value Date/Time   IRON 47 11/25/2018 0309   IRON 68 02/04/2018 1705   TIBC 210 (L) 11/25/2018 0309   FERRITIN 265 11/25/2018 0309   FERRITIN 27 02/04/2018 1705   IRONPCTSAT 22 11/25/2018 0309       ASSESSMENT/PLAN:    43 year old female patient who presented with 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  She has underlying chronic kidney disease stage V.  She has underlying type 2 diabetes.  She presented for a left above-the-knee amputation.  1.  Chronic kidney disease stage V.  She sees Dr. Posey Pronto in the  outpatient clinic.  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  No urgent indication for dialysis, but suspect will need about dialysis in the near future.    Has significant vomiting today although she has underlying gastroparesis and cyclic vomiting.  AV fistula does not appear to be ready for use just yet.  Will transition back to oral bicarbonate.  BUN of 30 does not suggest significant uremia.  Renal function is currently stable.  No indication for urgent dialysis.  2.  Status post left above-the-knee amputation.  Surgery following and managing.  3.  Secondary hyperparathyroidism.  Continue PhosLo.  PTH is appropriate for CKD stage.  Discontinue calcium supplementation.  Add calcitriol thrice weekly.  4.  Anemia of chronic kidney disease.  Iron and ferritin are appropriate.  Transfuse PRN.  5.  Type 2 diabetes.  Continue outpatient therapy.  6.  Hypertension.  Continue pre-surgery medications.  7.  HIV.  Continue HAART therapy.  8.  Diabetic gastroparesis.  She does also have a diagnosis of cyclic vomiting.  Likely aggravated by anesthesia and pain medications.  9.  Chronic metabolic acidosis.  Will DC IV bicarbonate and change back to oral.  Improved.  [place], DO, [place]

## 2018-12-18 NOTE — Progress Notes (Addendum)
Occupational Therapy Evaluation  PTA, pt was living with her two adult daughters and was performing BADLs and transfers independently with RW. Pt currently requiring Min Guard-Min A for LB ADLs and functional transfers. Pt motivated to participate in therapy despite pain. Pt performing LB bathing and dressing with Min Guard-Min A. Pt would benefit from further acute OT to facilitate safe dc. Recommend dc to home once medically stable per physician.     12/18/18 1100  OT Visit Information  Last OT Received On 12/18/18  Assistance Needed +1  PT/OT/SLP Co-Evaluation/Treatment Yes  Reason for Co-Treatment To address functional/ADL transfers (pain management)  OT goals addressed during session ADL's and self-care  History of Present Illness Pt is a 43 y/o female s/p L residual limb revision from transtibial to now transfemoral amputation. PMH including but not limited to DM, HTN, HIV, CKD.  Precautions  Precautions Fall  Precaution Comments wound VAC  Restrictions  Weight Bearing Restrictions Yes  LLE Weight Bearing NWB  Home Living  Family/patient expects to be discharged to: Private residence  Living Arrangements Children  Available Help at Discharge Family  Type of [place] One level  Bathroom Shower/Tub Tub/shower unit  Corporate treasurer Yes  How Accessible Accessible via walker  [place] - 2 wheels;BSC;Wheelchair - manual;Tub bench  Prior Function  Level of [place] with assistive device(s)  Comments amb with RW. No assist with ADLs.  Communication  Communication No difficulties  Pain Assessment  Pain Assessment Faces  Faces Pain Scale 6  Pain Location L residual limb  Pain Descriptors / Indicators Constant;Discomfort;Guarding;Grimacing;Heaviness;Moaning  Pain Intervention(s) Monitored during session;Repositioned  Cognition  Arousal/Alertness Awake/alert  Behavior  During Therapy WFL for tasks assessed/performed  Overall Cognitive Status Within Functional Limits for tasks assessed  Upper Extremity Assessment  Upper Extremity Assessment Overall WFL for tasks assessed  Lower Extremity Assessment  Lower Extremity Assessment Defer to PT evaluation;LLE deficits/detail  LLE Deficits / Details wound VAC in place; pt requiring assistance from bilateral UEs to move L residual limb through hip ROM  LLE Unable to fully assess due to pain  Cervical / Trunk Assessment  Cervical / Trunk Assessment Normal  ADL  Overall ADL's  Needs assistance/impaired  Eating/Feeding Independent;Sitting  Grooming Independent;Sitting  Upper Body Bathing Supervision/ safety;Set up;Sitting  Lower Body Bathing Min guard;Sit to/from stand;Minimal assistance  Lower Body Bathing Details (indicate cue type and reason) Pt performing LB bathing and peri care with Min Guard A-Min A for safety in standing  Upper Body Dressing  Set up;Supervision/safety;Sitting  Lower Body Dressing Minimal assistance;Sitting/lateral leans  Lower Body Dressing Details (indicate cue type and reason) Min A to bring underwear over woundvac. Pt then laterally leaning to bring underwear over hips demonstrating good techniques.  Contractor (lateral scoot to drop arm recliner)  Functional mobility during ADLs Min guard;Rolling walker (lateral scoot and sit<>stand)  General ADL Comments Pt demonstrating good balance and highly motivated to participate despite pain. Pt performing ADLs at supervision-Min A level.   Bed Mobility  Overal bed mobility Modified Independent  General bed mobility comments Increased time  Transfers  Overall transfer level Needs assistance  Equipment used Rolling walker (2 wheeled)  Transfers Sit to/from Stand;Lateral/Scoot Transfers  Sit to Stand Min guard   Lateral/Scoot Transfers Min guard  General transfer comment Min Guard A for safety and pt  demonstrating good technique  Balance  Overall balance assessment  Needs assistance  Sitting-balance support No upper extremity supported  Sitting balance-Leahy Scale Fair  Standing balance support Bilateral upper extremity supported;Single extremity supported;During functional activity  Standing balance-Leahy Scale Poor  Standing balance comment Reliant on UE support  General Comments  General comments (skin integrity, edema, etc.) Educating pt on exericses for LLE s/p amputation.   Exercises  Exercises Amputee  Amputee Exercises  Hip Extension AROM;Left;10 reps;Standing (Min GUard A)  Hip ABduction/ADduction AROM;Left;10 reps;Seated;Standing  OT - End of Session  Equipment Utilized During Treatment Rolling walker  Activity Tolerance Patient tolerated treatment well  Patient left in chair;with call bell/phone within reach  Nurse Communication Mobility status  OT Assessment  OT Recommendation/Assessment Patient needs continued OT Services  OT Visit Diagnosis Unsteadiness on feet (R26.81);Other abnormalities of gait and mobility (R26.89);Muscle weakness (generalized) (M62.81);Pain  Pain - Right/Left Left  Pain - part of body  (residual limb)  OT Problem List Decreased strength;Decreased range of motion;Decreased activity tolerance;Impaired balance (sitting and/or standing);Decreased knowledge of use of DME or AE;Decreased knowledge of precautions;Impaired UE functional use  OT Plan  OT Frequency (ACUTE ONLY) Min 2X/week  OT Treatment/Interventions (ACUTE ONLY) Self-care/ADL training;Therapeutic exercise;Energy conservation;DME and/or AE instruction;Therapeutic activities;Patient/family education  AM-PAC OT "6 Clicks" Daily Activity Outcome Measure (Version 2)  Help from another person eating meals? 4  Help from another person taking care of personal grooming? 3  Help from another person toileting, which includes using toliet, bedpan, or urinal? 3  Help from another person bathing  (including washing, rinsing, drying)? 3  Help from another person to put on and taking off regular upper body clothing? 4  Help from another person to put on and taking off regular lower body clothing? 3  6 Click Score 20  OT Recommendation  Recommendations for Other Services PT consult  Follow Up Recommendations No OT follow up;Supervision/Assistance - 24 hour  OT Equipment None recommended by OT  Individuals Consulted  Consulted and Agree with Results and Recommendations Patient  Acute Rehab OT Goals  Patient Stated Goal "Go home to my family"  OT Goal Formulation With patient  Time For Goal Achievement 01/01/19  Potential to Achieve Goals Good  OT Time Calculation  OT Start Time (ACUTE ONLY) 0845  OT Stop Time (ACUTE ONLY) 0910  OT Time Calculation (min) 25 min  OT General Charges  $OT Visit 1 Visit  OT Evaluation  $OT Eval Moderate Complexity 1 Mod  Written Expression  Dominant Hand Right   [place], OTR/L Acute Rehab Pager: [phone] Office: [phone]

## 2018-12-18 NOTE — Plan of Care (Signed)

## 2018-12-18 NOTE — Progress Notes (Signed)
Physical Therapy Treatment Patient Details Name: Kathy Frank MRN: [id number] DOB: January 23, 1976 Today's Date: 12/18/2018    History of Present Illness Pt is a 43 y/o female s/p L residual limb revision from transtibial to now transfemoral amputation. PMH including but not limited to DM, HTN, HIV, CKD.    PT Comments    Patient progressing well towards PT goals. Tolerated lateral scoot transfer to chair today with supervision for safety and cues for technique. Performed ADL tasks in standing with close min guard-Min A at times for balance. Tolerated standing exercises with use of RW for support to work on gluteals and abduction. Instructed pt in seated exercises as well to perform throughout the day. Will provide handout next session. Pt highly motivated. Education re: desensitization and positioning of left residual limb. Will follow.   Follow Up Recommendations  Home health PT;Supervision - Intermittent     Equipment Recommendations  None recommended by PT    Recommendations for Other Services       Precautions / Restrictions Precautions Precautions: Fall Precaution Comments: wound VAC Restrictions Weight Bearing Restrictions: Yes LLE Weight Bearing: Non weight bearing    Mobility  Bed Mobility Overal bed mobility: Modified Independent             General bed mobility comments: Increased time, difficulty lifting left residual limb  Transfers Overall transfer level: Needs assistance Equipment used: Rolling walker (2 wheeled) Transfers: Sit to/from Stand;Lateral/Scoot Transfers Sit to Stand: Min guard        Lateral/Scoot Transfers: Supervision General transfer comment: Min Guard A for safety and pt demonstrating good technique. Min A at times for dynamic tasks in standing. Stood from chair x3. Lateral scoot to chair with supervision.  Ambulation/Gait             General Gait Details: Deferred   Stairs             Wheelchair Mobility     Modified Rankin (Stroke Patients Only)       Balance Overall balance assessment: Needs assistance Sitting-balance support: No upper extremity supported;Feet supported Sitting balance-Leahy Scale: Fair     Standing balance support: Bilateral upper extremity supported;Single extremity supported;During functional activity Standing balance-Leahy Scale: Poor Standing balance comment: Reliant on UE support. Able to donn underwear and clean self in standing wtih min guard-Min A for balance. Performed standing exercises.                             Cognition Arousal/Alertness: Awake/alert Behavior During Therapy: WFL for tasks assessed/performed Overall Cognitive Status: Within Functional Limits for tasks assessed                                        Exercises Amputee Exercises Hip Extension: AROM;Left;10 reps;Standing;Seated Hip ABduction/ADduction: AROM;Left;10 reps;Seated;Standing    General Comments General comments (skin integrity, edema, etc.): Educating pt on exericses for LLE s/p amputation.       Pertinent Vitals/Pain Pain Assessment: Faces Faces Pain Scale: Hurts even more Pain Location: L residual limb Pain Descriptors / Indicators: Constant;Discomfort;Guarding;Grimacing;Heaviness;Moaning Pain Intervention(s): Monitored during session;Repositioned    Home Living Family/patient expects to be discharged to:: Private residence Living Arrangements: Children Available Help at Discharge: Family Type of Home: House Home Access: Ramped entrance   Home Layout: One level Home Equipment: Environmental consultant - 2 wheels;Bedside commode;Wheelchair - manual;Tub bench  Prior Function Level of Independence: Independent with assistive device(s)      Comments: amb with RW. No assist with ADLs.   PT Goals (current goals can now be found in the care plan section) Acute Rehab PT Goals Patient Stated Goal: decrease pain Progress towards PT goals:  Progressing toward goals    Frequency    Min 5X/week      PT Plan Current plan remains appropriate    Co-evaluation PT/OT/SLP Co-Evaluation/Treatment: Yes Reason for Co-Treatment: To address functional/ADL transfers(pain management) PT goals addressed during session: Mobility/safety with mobility;Balance;Strengthening/ROM OT goals addressed during session: ADL's and self-care      AM-PAC PT "6 Clicks" Mobility   Outcome Measure  Help needed turning from your back to your side while in a flat bed without using bedrails?: None Help needed moving from lying on your back to sitting on the side of a flat bed without using bedrails?: None Help needed moving to and from a bed to a chair (including a wheelchair)?: A Little Help needed standing up from a chair using your arms (e.g., wheelchair or bedside chair)?: A Little Help needed to walk in hospital room?: A Little Help needed climbing 3-5 steps with a railing? : A Lot 6 Click Score: 19    End of Session Equipment Utilized During Treatment: Other (comment)(wound vac) Activity Tolerance: Patient tolerated treatment well Patient left: with call bell/phone within reach;in chair Nurse Communication: Mobility status PT Visit Diagnosis: Other abnormalities of gait and mobility (R26.89);Pain Pain - Right/Left: Left Pain - part of body: Leg     Time: 1610-9604 PT Time Calculation (min) (ACUTE ONLY): 23 min  Charges:  $Therapeutic Activity: 8-22 mins                     Wray Kearns, PT, DPT Acute Rehabilitation Services Pager [phone] Office [place] 12/18/2018, 11:53 AM

## 2018-12-19 LAB — BASIC METABOLIC PANEL
Anion gap: 11 (ref 5–15)
BUN: 40 mg/dL — ABNORMAL HIGH (ref 6–20)
CO2: 23 mmol/L (ref 22–32)
Calcium: 7.2 mg/dL — ABNORMAL LOW (ref 8.9–10.3)
Chloride: 103 mmol/L (ref 98–111)
Creatinine, Ser: 6.34 mg/dL — ABNORMAL HIGH (ref 0.44–1.00)
GFR calc Af Amer: 9 mL/min — ABNORMAL LOW (ref >60)
GFR calc non Af Amer: 7 mL/min — ABNORMAL LOW (ref >60)
Glucose, Bld: 141 mg/dL — ABNORMAL HIGH (ref 70–99)
Potassium: 3.7 mmol/L (ref 3.5–5.1)
Sodium: 137 mmol/L (ref 135–145)

## 2018-12-19 LAB — GLUCOSE, CAPILLARY
Glucose-Capillary: 103 mg/dL — ABNORMAL HIGH (ref 70–99)
Glucose-Capillary: 120 mg/dL — ABNORMAL HIGH (ref 70–99)
Glucose-Capillary: 128 mg/dL — ABNORMAL HIGH (ref 70–99)
Glucose-Capillary: 145 mg/dL — ABNORMAL HIGH (ref 70–99)

## 2018-12-19 NOTE — Progress Notes (Signed)
Subjective: 3 Days Post-Op Procedure(s) (LRB): LEFT ABOVE KNEE AMPUTATION (Left) Patient reports pain as moderate.    Objective: Vital signs in last 24 hours: Temp:  [97.5 F (36.4 C)-98.3 F (36.8 C)] 97.5 F (36.4 C) (05/11 0511) Pulse Rate:  [82-87] 86 (05/11 0511) Resp:  [16-17] 16 (05/11 0511) BP: (111-139)/(64-69) 132/64 (05/11 0511) SpO2:  [98 %-100 %] 100 % (05/11 0511)  Intake/Output from previous day: 05/10 0701 - 05/11 0700 In: 240 [P.O.:240] Out: -  Intake/Output this shift: No intake/output data recorded.  Recent Labs    12/16/18 0816  HGB 9.4*   Recent Labs    12/16/18 0816  WBC 7.0  RBC 3.11*  HCT 30.7*  PLT 282   Recent Labs    12/17/18 1401 12/18/18 0925  NA 138 139  K 4.3 3.8  CL 111 104  CO2 17* 22  BUN 32* 35*  CREATININE 6.10* 6.13*  GLUCOSE 155* 100*  CALCIUM 8.1* 7.8*   No results for input(s): LABPT, INR in the last 72 hours.  Left Above the knee amputation site with VAC dressing in place and functioning well. No drainage in VAC canister.    Assessment/Plan: 3 Days Post-Op Procedure(s) (LRB): LEFT ABOVE KNEE AMPUTATION (Left) Continue VAC therapy Continue PT/OT and likely DC to home soon once therapies feels safe.  Will plan to DC with Prevena VAC.    Erlinda Hong, PA-C 12/19/2018, 7:04 AM  [place]

## 2018-12-19 NOTE — Progress Notes (Signed)
Focus of session on toilet transfers, pericare and management of clothing. Pt reports bathing and dressing at bed level with set up earlier today. Provided pt with amputee support group at her request.    12/19/18 1439  OT Visit Information  Last OT Received On 12/19/18  Assistance Needed +1  History of Present Illness Pt is a 43 y/o female s/p L residual limb revision from transtibial to now transfemoral amputation. PMH including but not limited to DM, HTN, HIV, CKD.  Precautions  Precautions Fall  Precaution Comments wound VAC  Pain Assessment  Pain Assessment Faces  Faces Pain Scale 4  Pain Location L residual limb  Pain Descriptors / Indicators Constant;Discomfort;Guarding;Grimacing;Heaviness;Moaning  Pain Intervention(s) Monitored during session;Repositioned  Cognition  Arousal/Alertness Awake/alert  Behavior During Therapy WFL for tasks assessed/performed  Overall Cognitive Status Within Functional Limits for tasks assessed  Upper Extremity Assessment  Upper Extremity Assessment RUE deficits/detail  RUE Deficits / Details painful R wrist from injury PTA, pt icing  ADL  Overall ADL's  Needs assistance/impaired  Grooming Wash/dry hands;Sitting;Set up  Grooming Details (indicate cue type and reason) after toileting  Toilet Transfer Min guard;Stand-pivot;RW;BSC  Toileting- Clothing Manipulation and Hygiene Minimal assistance;Sit to/from stand  General ADL Comments Pt reports performing her own LB bathing and dressing with set up at bed level, rolling side to side. Pt asking for amputee support group info, provided from [facility] website.  Bed Mobility  Overal bed mobility Modified Independent  Balance  Overall balance assessment Needs assistance  Sitting-balance support No upper extremity supported;Feet supported  Sitting balance-Leahy Scale Fair  Standing balance-Leahy Scale Poor  Standing balance comment min assist for balance while managing underwear after toileting   Restrictions  Weight Bearing Restrictions Yes  LLE Weight Bearing NWB  Transfers  Overall transfer level Needs assistance  Equipment used Rolling walker (2 wheeled)  Transfers Sit to/from Stand;Stand Pivot Transfers  Sit to Stand Min guard  Stand pivot transfers Min guard  General transfer comment cues for technique, min guard for safety  OT - End of Session  Equipment Utilized During Treatment Gait belt;Rolling walker  Activity Tolerance Patient tolerated treatment well  Patient left in bed;with call bell/phone within reach  OT Assessment/Plan  OT Plan Discharge plan remains appropriate  OT Visit Diagnosis Unsteadiness on feet (R26.81);Other abnormalities of gait and mobility (R26.89);Muscle weakness (generalized) (M62.81);Pain  OT Frequency (ACUTE ONLY) Min 2X/week  Follow Up Recommendations No OT follow up;Supervision/Assistance - 24 hour  OT Equipment None recommended by OT  AM-PAC OT "6 Clicks" Daily Activity Outcome Measure (Version 2)  Help from another person eating meals? 4  Help from another person taking care of personal grooming? 3  Help from another person toileting, which includes using toliet, bedpan, or urinal? 3  Help from another person bathing (including washing, rinsing, drying)? 3  Help from another person to put on and taking off regular upper body clothing? 4  Help from another person to put on and taking off regular lower body clothing? 3  6 Click Score 20  OT Goal Progression  Progress towards OT goals Progressing toward goals  Acute Rehab OT Goals  Patient Stated Goal decrease pain  OT Goal Formulation With patient  Time For Goal Achievement 01/01/19  Potential to Achieve Goals Good  OT Time Calculation  OT Start Time (ACUTE ONLY) 1408  OT Stop Time (ACUTE ONLY) 1427  OT Time Calculation (min) 19 min  OT General Charges  $OT Visit 1 Visit  OT  Treatments  $Self Care/Home Management  8-22 mins  Nestor Lewandowsky, OTR/L Acute Rehabilitation  Services Pager: [phone] Office: [phone]

## 2018-12-19 NOTE — TOC Initial Note (Addendum)
Transition of Care [facility]) - Initial/Assessment Note    Patient Details  Name: Kathy Frank MRN: [id number] Date of Birth: Dec 09, 1975  Transition of Care [facility]) CM/SW Contact:    Marilu Favre, RN Phone Number: 12/19/2018, 12:47 PM  Clinical Narrative:                 Patient from home with family. Has had [facility] before.   She has recently had [facility] for home health PT and would like them again. Referral called to [facility] with [place] awaiting on call back.   Medicare.gov list was provided.  Patient has walker at home , wants 3 in 1 same ordered.Called Zack with [facility] for 3 in 1   Dan with [facility] has accepted for HHPT  Expected Discharge Plan: [place] Barriers to Discharge: Continued Medical Work up   Patient Goals and CMS Choice Patient states their goals for this hospitalization and ongoing recovery are:: to go home  CMS Medicare.gov Compare Post Acute Care list provided to:: Patient Choice offered to / list presented to : Patient  Expected Discharge Plan and Services Expected Discharge Plan: [place]   Discharge Planning Services: CM Consult Post Acute Care Choice: [place] arrangements for the past 2 months: Single Family Home                 DME Arranged: 3-N-1 DME Agency: [facility] Date DME Agency Contacted: 12/19/18 Time DME Agency Contacted: [phone] Representative spoke with at DME Agency: [place]: [place] ([place]) Date [place]: 12/19/18 Time [place]: [place] Representative spoke with at [place]: [place] Arrangements/Services Living arrangements for the past 2 months: [place] with:: Relatives Patient language and need for interpreter reviewed:: Yes Do you feel safe going back to the place where you live?: Yes      Need for Family Participation in Patient Care: Yes (Comment) Care  giver support system in place?: Yes (comment) Current home services: DME Criminal Activity/Legal Involvement Pertinent to Current Situation/Hospitalization: No - Comment as needed  Activities of Daily Living Home Assistive Devices/Equipment: Environmental consultant (specify type), Dentures (specify type), Wheelchair, Bedside commode/3-in-1, CBG Meter ADL Screening (condition at time of admission) Patient's cognitive ability adequate to safely complete daily activities?: Yes Is the patient deaf or have difficulty hearing?: No Does the patient have difficulty seeing, even when wearing glasses/contacts?: No Does the patient have difficulty concentrating, remembering, or making decisions?: No Patient able to express need for assistance with ADLs?: Yes Does the patient have difficulty dressing or bathing?: Yes Independently performs ADLs?: Yes (appropriate for developmental age) Does the patient have difficulty walking or climbing stairs?: No Weakness of Legs: Both Weakness of Arms/Hands: Both  Permission Sought/Granted   Permission granted to share information with : Yes, Verbal Permission Granted     Permission granted to share info w AGENCY: [facility]        Emotional Assessment Appearance:: Appears stated age Attitude/Demeanor/Rapport: Engaged Affect (typically observed): Accepting Orientation: : Oriented to Self, Oriented to Place, Oriented to  Time, Oriented to Situation Alcohol / Substance Use: Not Applicable Psych Involvement: No (comment)  Admission diagnosis:  Dehiscence Left Below Knee Amputation Patient Active Problem List   Diagnosis Date Noted  . 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 Anemia due to chronic kidney disease   .  Renal failure (ARF), acute on chronic ([place]) 11/24/2018  . Fall due to stumbling   . Below-knee amputation of left lower extremity ([place])   . BKA stump complication ([place]) 85/90/9311  . Osteomyelitis ([place])  10/07/2018  . Dehiscence of amputation stump ([place])   . Moderate protein-calorie malnutrition ([place])   . Adjustment disorder with mixed anxiety and depressed mood 08/15/2018  . Wound dehiscence   . Wound infection s/p L transmetatarsal amputation   . CKD (chronic kidney disease), stage V ([place])   . Acute on chronic anemia 07/03/2018  . Fecal incontinence 02/04/2018  . Healthcare maintenance 09/17/2017  . Right leg swelling 02/26/2017  . Superficial ulcerative lesion ([place]) 02/02/2017  . Aortic atherosclerosis ([place]) 09/30/2016  . Diabetic foot ulcer associated with diabetes mellitus due to underlying condition ([place]) 07/08/2016  . Human immunodeficiency virus (HIV) disease ([place]) 04/23/2016  . Gastroparesis   . Moderate nonproliferative diabetic retinopathy of both eyes ([place]) 11/21/2014  . Esophageal reflux   . Microscopic hematuria 10/31/2014  . Hypertension 11/16/2013  . TOBACCO USER 06/28/2006  . Depression 06/28/2006  . Diabetes mellitus type 2 in obese ([place]) 06/28/1994   PCP:  Lars Mage, MD Pharmacy:   [facility], [place] - [place] [address] [address] Phone: [phone] Fax: [phone]     Social Determinants of Health (SDOH) Interventions    Readmission Risk Interventions Readmission Risk Prevention Plan 10/29/2018  Transportation Screening Complete  Medication Review (RN Care Manager) Referral to Pharmacy  PCP or Specialist appointment within 3-5 days of discharge Not Complete  PCP/Specialist Appt Not Complete comments (No Data)  [place] or [place] Complete  [place] Not Applicable  Some recent data might be hidden

## 2018-12-19 NOTE — Progress Notes (Signed)
[facility]    NEPHROLOGY PROGRESS NOTE  SUBJECTIVE: No acute events.  Denies headaches, fevers, chills, chest pain, shortness of breath, nausea, vomiting, diarrhea or dysuria.  Denies any dizziness or lightheadedness.  Complaining of pain at her stump.  All other review of systems are negative.   OBJECTIVE:  Vitals:   12/18/18 2039 12/19/18 0511  BP: 139/69 132/64  Pulse: 87 86  Resp: 17 16  Temp:  (!) 97.5 F (36.4 C)  SpO2: 98% 100%    Intake/Output Summary (Last 24 hours) at 12/19/2018 1234 Last data filed at 12/19/2018 9678 Gross per 24 hour  Intake 360 ml  Output -  Net 360 ml      General:  AAOx3 NAD HEENT: MMM [place] AT anicteric sclera Neck:  No JVD, no adenopathy CV:  Heart RRR  Lungs:  L/S CTA bilaterally Abd:  abd SNT/ND with normal BS GU:  Bladder non-palpable Extremities:  No LE edema.  Left above-the-knee amputation. Skin:  No skin rash  MEDICATIONS:  . amLODipine  10 mg Oral Daily  . calcitRIOL  0.25 mcg Oral Once per day on Mon Wed Fri  . calcium acetate  1,334 mg Oral TID WC  . citalopram  20 mg Oral Daily  . docusate sodium  100 mg Oral BID  . dolutegravir  50 mg Oral Q breakfast   And  . rilpivirine  25 mg Oral Q breakfast  . gabapentin  300 mg Oral Daily  . insulin aspart  0-9 Units Subcutaneous TID WC  . insulin glargine  5 Units Subcutaneous QHS  . labetalol  300 mg Oral BID  . megestrol  40 mg Oral Daily  . nutrition supplement (JUVEN)  1 packet Oral BID BM  . senna  2 tablet Oral BID  . sodium bicarbonate  650 mg Oral BID       LABS:   CBC Latest Ref Rng & Units 12/16/2018 11/26/2018 11/25/2018  WBC 4.0 - 10.5 K/uL 7.0 5.7 -  Hemoglobin 12.0 - 15.0 g/dL 9.4(L) 7.3(L) 7.3(L)  Hematocrit 36.0 - 46.0 % 30.7(L) 23.2(L) 22.7(L)  Platelets 150 - 400 K/uL 282 177 -    CMP Latest Ref Rng & Units 12/19/2018 12/18/2018 12/17/2018  Glucose 70 - 99 mg/dL 141(H) 100(H) 155(H)  BUN 6 - 20 mg/dL 40(H) 35(H) 32(H)  Creatinine 0.44 -  1.00 mg/dL 6.34(H) 6.13(H) 6.10(H)  Sodium 135 - 145 mmol/L 137 139 138  Potassium 3.5 - 5.1 mmol/L 3.7 3.8 4.3  Chloride 98 - 111 mmol/L 103 104 111  CO2 22 - 32 mmol/L 23 22 17(L)  Calcium 8.9 - 10.3 mg/dL 7.2(L) 7.8(L) 8.1(L)  Total Protein 6.5 - 8.1 g/dL - - -  Total Bilirubin 0.3 - 1.2 mg/dL - - -  Alkaline Phos 38 - 126 U/L - - -  AST 15 - 41 U/L - - -  ALT 0 - 44 U/L - - -    Lab Results  Component Value Date   PTH 210 (H) 11/25/2018   PTH Comment 11/25/2018   CALCIUM 7.2 (L) 12/19/2018   CAION 1.04 (L) 10/07/2018   PHOS 4.6 11/26/2018       Component Value Date/Time   COLORURINE YELLOW 07/02/2018 2158   APPEARANCEUR HAZY (A) 07/02/2018 2158   APPEARANCEUR Cloudy (A) 04/23/2016 1530   LABSPEC 1.013 07/02/2018 2158   PHURINE 5.0 07/02/2018 2158   GLUCOSEU 50 (A) 07/02/2018 2158   HGBUR MODERATE (A) 07/02/2018 2158   BILIRUBINUR NEGATIVE 07/02/2018  2158   BILIRUBINUR Negative 04/23/2016 [place] 07/02/2018 2158   PROTEINUR 100 (A) 07/02/2018 2158   UROBILINOGEN 0.2 05/30/2015 1316   NITRITE NEGATIVE 07/02/2018 2158   LEUKOCYTESUR NEGATIVE 07/02/2018 2158   LEUKOCYTESUR Negative 04/23/2016 1530      Component Value Date/Time   HCO3 14.5 (L) 10/07/2018 0403   TCO2 15 (L) 10/07/2018 0403   ACIDBASEDEF 11.0 (H) 10/07/2018 0403   O2SAT 94.0 10/07/2018 0403       Component Value Date/Time   IRON 47 11/25/2018 0309   IRON 68 02/04/2018 1705   TIBC 210 (L) 11/25/2018 0309   FERRITIN 265 11/25/2018 0309   FERRITIN 27 02/04/2018 1705   IRONPCTSAT 22 11/25/2018 0309       ASSESSMENT/PLAN:    43 year old female patient who presented with 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  She has underlying chronic kidney disease stage V.  She has underlying type 2 diabetes.  She presented for a left above-the-knee amputation.  1.  Chronic kidney disease stage V.  She sees Dr. Posey Pronto in the outpatient clinic.  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  No urgent indication for dialysis, but suspect will need about dialysis in the near future.   AV fistula does not appear to be ready for use just yet.  Renal function is currently stable.  No indication for urgent dialysis.    2.  Status post left above-the-knee amputation.  Surgery following and managing.  3.  Secondary hyperparathyroidism.  Continue PhosLo.  PTH is appropriate for CKD stage.  Continue calcitriol.  4.  Anemia of chronic kidney disease.  Iron and ferritin are appropriate.  Transfuse PRN.  5.  Type 2 diabetes.  Continue outpatient therapy.  6.  Hypertension.  Continue pre-surgery medications.  Blood pressures are stable.  7.  HIV.  Continue HAART therapy.  8.  Diabetic gastroparesis.  She does also have a diagnosis of cyclic vomiting.  Likely aggravated by anesthesia and pain medications.  Had nausea and vomiting postoperatively, which has improved.  9.  Chronic metabolic acidosis.  Stable on oral sodium bicarbonate.  Okay for disposition from renal standpoint.  Needs follow-up in the office in 2 to 3 weeks after discharge.  [place], DO, [place]

## 2018-12-19 NOTE — Progress Notes (Signed)
Physical Therapy Treatment Patient Details Name: Kathy Frank MRN: [id number] DOB: Oct 13, 1975 Today's Date: 12/19/2018    History of Present Illness Pt is a 43 y/o female s/p L residual limb revision from transtibial to now transfemoral amputation. PMH including but not limited to DM, HTN, HIV, CKD.    PT Comments    Pt performed gait training and functional mobility.  On arrival wound vac beeping and patient awaiting new dressing to arrive to reseal per RN.  Pt continues to be appropriate to return home with HHPT.  Pt requiring min to min guard assistance at this time.     Follow Up Recommendations  Home health PT;Supervision - Intermittent     Equipment Recommendations  None recommended by PT    Recommendations for Other Services       Precautions / Restrictions Precautions Precautions: Fall Precaution Comments: wound VAC Restrictions Weight Bearing Restrictions: Yes LLE Weight Bearing: Non weight bearing    Mobility  Bed Mobility Overal bed mobility: Modified Independent             General bed mobility comments: Increased time, difficulty lifting left residual limb  Transfers Overall transfer level: Needs assistance Equipment used: Rolling walker (2 wheeled) Transfers: Sit to/from Stand Sit to Stand: Min guard         General transfer comment: Cues for hand placement to push from seated surface.  Pt reliant on UE support to maintain standing.    Ambulation/Gait Ambulation/Gait assistance: Min assist Gait Distance (Feet): 6 Feet Assistive device: Rolling walker (2 wheeled) Gait Pattern/deviations: Step-to pattern;Trunk flexed(hop to)         Stairs             Wheelchair Mobility    Modified Rankin (Stroke Patients Only)       Balance Overall balance assessment: Needs assistance Sitting-balance support: No upper extremity supported;Feet supported Sitting balance-Leahy Scale: Fair       Standing balance-Leahy Scale:  Poor Standing balance comment: Reliant on UE support. Able to donn underwear and clean self in standing wtih min guard-Min A for balance.                             Cognition Arousal/Alertness: Awake/alert Behavior During Therapy: WFL for tasks assessed/performed Overall Cognitive Status: Within Functional Limits for tasks assessed                                        Exercises Amputee Exercises Quad Sets: AROM;Left;10 reps;Supine Hip ABduction/ADduction: AROM;Left;10 reps;Sidelying Knee Extension: AROM;Left;10 reps;Sidelying    General Comments        Pertinent Vitals/Pain Pain Assessment: 0-10 Pain Score: 6  Pain Location: L residual limb Pain Descriptors / Indicators: Constant;Discomfort;Guarding;Grimacing;Heaviness;Moaning Pain Intervention(s): Monitored during session;Repositioned    Home Living                      Prior Function            PT Goals (current goals can now be found in the care plan section) Acute Rehab PT Goals Patient Stated Goal: decrease pain Potential to Achieve Goals: Good Progress towards PT goals: Progressing toward goals    Frequency    Min 5X/week      PT Plan Current plan remains appropriate    Co-evaluation  AM-PAC PT "6 Clicks" Mobility   Outcome Measure  Help needed turning from your back to your side while in a flat bed without using bedrails?: None Help needed moving from lying on your back to sitting on the side of a flat bed without using bedrails?: None Help needed moving to and from a bed to a chair (including a wheelchair)?: A Little Help needed standing up from a chair using your arms (e.g., wheelchair or bedside chair)?: A Little Help needed to walk in hospital room?: A Little Help needed climbing 3-5 steps with a railing? : A Lot 6 Click Score: 19    End of Session Equipment Utilized During Treatment: Other (comment);Gait belt(wound vac) Activity  Tolerance: Patient tolerated treatment well Patient left: with call bell/phone within reach;in chair Nurse Communication: Mobility status PT Visit Diagnosis: Other abnormalities of gait and mobility (R26.89);Pain Pain - Right/Left: Left Pain - part of body: Leg     Time: 2423-5361 PT Time Calculation (min) (ACUTE ONLY): 26 min  Charges:  $Therapeutic Exercise: 8-22 mins $Therapeutic Activity: 8-22 mins                     Governor Rooks, PTA Acute Rehabilitation Services Pager [phone] Office [phone]     Siddarth Hsiung Eli Hose 12/19/2018, 2:11 PM

## 2018-12-19 NOTE — Progress Notes (Signed)
Inpatient Rehab Admissions:  Inpatient Rehab Consult received. Recommendations are currently for [facility] therapy. I met with pt at the bedside for rehabilitation assessment. Pt is doing well functionally and would prefer to return home. Pt feels she has enough support to go home safely.   AC will sign off. Please call if questions.    Jhonnie Garner, OTR/L  Rehab Admissions Coordinator  [phone] 12/19/2018 11:31 AM

## 2018-12-20 ENCOUNTER — Other Ambulatory Visit: Payer: Self-pay | Admitting: Internal Medicine

## 2018-12-20 DIAGNOSIS — I10 Essential (primary) hypertension: Secondary | ICD-10-CM

## 2018-12-20 LAB — GLUCOSE, CAPILLARY
Glucose-Capillary: 93 mg/dL (ref 70–99)
Glucose-Capillary: 121 mg/dL — ABNORMAL HIGH (ref 70–99)

## 2018-12-20 MED ORDER — OXYCODONE-ACETAMINOPHEN 5-325 MG PO TABS: 1.0000 | ORAL_TABLET | ORAL | 0 refills | Status: DC | PRN

## 2018-12-20 NOTE — Progress Notes (Signed)
AVS given and reviewed with pt. Medications discussed. All questions answered to satisfaction. Praveena wound vac in place and in working order. Pt transported off the unit via wheelchair by volunteer services.

## 2018-12-20 NOTE — Discharge Summary (Signed)
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 female who was admitted 12/16/2018 with a chief complaint of dehiscence left below the knee amputation, with a final diagnosis of Dehiscence Left Below Knee Amputation.  Patient was brought to the operating room on 12/16/2018 and underwent Procedure(s): LEFT ABOVE KNEE AMPUTATION.    Patient was given perioperative antibiotics:  Anti-infectives (From admission, onward)   Start     Dose/Rate Route Frequency Ordered Stop   12/17/18 0800  Dolutegravir-Rilpivirine 50-25 MG TABS 1 tablet  Status:  Discontinued     1 tablet Oral Daily with breakfast 12/16/18 1233 12/16/18 1458   12/17/18 0800  dolutegravir (TIVICAY) tablet 50 mg     50 mg Oral Daily with breakfast 12/16/18 1458     12/17/18 0800  rilpivirine (EDURANT) tablet 25 mg     25 mg Oral Daily with breakfast 12/16/18 1458     12/16/18 0645  ceFAZolin (ANCEF) IVPB 2g/100 mL premix     2 g 200 mL/hr over 30 Minutes Intravenous On call to O.R. 12/16/18 1950 12/16/18 1007    .  Patient was given sequential compression devices, early ambulation, and aspirin for DVT prophylaxis.  Recent vital signs:  Patient Vitals for the past 24 hrs:  BP Temp Temp src Pulse Resp SpO2  12/20/18 0337 (!) 140/92 98.9 F (37.2 C) Oral 89 17 100 %  12/19/18 2049 123/62 99.8 F (37.7 C) Oral 86 17 98 %  .  Recent laboratory studies: No results found.  Discharge Medications:   Allergies as of 12/20/2018   No Known Allergies     Medication List    STOP taking these medications   silver sulfADIAZINE 1 % cream Commonly known as:  Silvadene     TAKE these medications   amLODipine 10 MG tablet Commonly known as:  NORVASC Take 1 tablet (10 mg total)  by mouth daily.   BC HEADACHE POWDER PO Take 1 packet by mouth as needed (for headaches).   calcitRIOL 0.25 MCG capsule Commonly known as:  ROCALTROL Take 1 capsule (0.25 mcg total) by mouth daily.   calcium acetate 667 MG capsule Commonly known as:  PHOSLO Take 2 capsules (1,334 mg total) by mouth 3 (three) times daily with meals.   citalopram 20 MG tablet Commonly known as:  CeleXA Take 1 tablet (20 mg total) by mouth daily.   furosemide 40 MG tablet Commonly known as:  LASIX Take 1 tablet (40 mg total) by mouth 2 (two) times daily.   gabapentin 300 MG capsule Commonly known as:  NEURONTIN Take 1 capsule (300 mg total) by mouth 2 (two) times daily.   glucose blood test strip Commonly known as:  Accu-Chek Guide USE TO TEST BLOOD SUGAR 3 TIMES DAILY . Diagnosis code  E11.69, E66.9   Insulin Glargine 100 UNIT/ML Solostar Pen Commonly known as:  LANTUS Inject 5 Units into the skin at bedtime.   Juluca 50-25 MG Tabs Generic drug:  Dolutegravir-Rilpivirine TAKE 1 TABLET BY MOUTH DAILY. PLEASE TAKE WITH LARGEST MEAL OF THE DAY. What changed:  See the new instructions.   labetalol 300 MG tablet Commonly known as:  NORMODYNE Take 1 tablet (300 mg total) by mouth 2 (two) times daily.   liraglutide 18  MG/3ML Sopn Commonly known as:  Victoza Inject 0.2 mLs (1.2 mg total) into the skin daily. What changed:  when to take this   megestrol 40 MG tablet Commonly known as:  MEGACE Take 1 tablet (40 mg total) by mouth daily.   methocarbamol 500 MG tablet Commonly known as:  ROBAXIN Take 1 tablet (500 mg total) by mouth every 6 (six) hours as needed for muscle spasms.   nutrition supplement (JUVEN) Pack Take 1 packet by mouth 2 (two) times daily between meals.   ondansetron 4 MG tablet Commonly known as:  Zofran Take 1 tablet (4 mg total) by mouth daily as needed for nausea or vomiting.   oxyCODONE-acetaminophen 5-325 MG tablet Commonly known as:  PERCOCET/ROXICET Take 1  tablet by mouth every 4 (four) hours as needed. What changed:    when to take this  reasons to take this   polyethylene glycol 17 g packet Commonly known as:  MIRALAX / GLYCOLAX Take 17 g by mouth daily. What changed:    when to take this  reasons to take this   senna 8.6 MG Tabs tablet Commonly known as:  SENOKOT Take 2 tablets (17.2 mg total) by mouth 2 (two) times daily.   sodium bicarbonate 650 MG tablet Take 2 tablets (1,300 mg total) by mouth 2 (two) times daily. What changed:  how much to take            Durable Medical Equipment  (From admission, onward)         Start     Ordered   12/19/18 1250  For home use only DME 3 n 1  Once     12/19/18 1249          Diagnostic Studies: Dg Chest Port 1 View  Result Date: 12/16/2018 CLINICAL DATA:  Central line placement EXAM: PORTABLE CHEST 1 VIEW COMPARISON:  11/24/2018 FINDINGS: 1134 hours. Low volume film. Minimal retrocardiac left base atelectasis or infiltrate evident. Interstitial markings are diffusely coarsened with chronic features. Right IJ central line tip overlies the distal SVC. No evidence for pneumothorax or pleural effusion. The cardio pericardial silhouette is enlarged. The visualized bony structures of the thorax are intact. Telemetry leads overlie the chest. IMPRESSION: 1. Right IJ central line tip overlies the distal SVC. No pneumothorax or pleural effusion. 2. Low volume film with cardiomegaly. 3. Interstitial prominence. Electronically Signed   By: Misty Stanley M.D.   On: 12/16/2018 11:46   Dg Chest Port 1 View  Result Date: 11/24/2018 CLINICAL DATA:  Short of breath cough 3 days EXAM: PORTABLE CHEST 1 VIEW COMPARISON:  07/02/2018 FINDINGS: Cardiac enlargement with vascular congestion and mild interstitial edema. Small bilateral pleural effusions and mild bibasilar atelectasis. IMPRESSION: Mild fluid overload with interstitial edema and small bilateral pleural effusions. Electronically Signed   By:  Franchot Gallo M.D.   On: 11/24/2018 10:57   Vas US Duplex Dialysis Access (avf, Avg)  Result Date: 11/26/2018 DIALYSIS ACCESS Reason for Exam: Routine follow up. Access Site: Left Upper Extremity. Access Type: Brachial-basilic AVF.  Examination Guidelines: A complete evaluation includes B-mode imaging, spectral Doppler, color Doppler, and power Doppler as needed of all accessible portions of each vessel. Unilateral testing is considered an integral part of a complete examination. Limited examinations for reoccurring indications may be performed as noted.  Findings: +--------------------+----------+-----------------+--------+ AVF                 PSV (cm/s)Flow Vol (mL/min)Comments +--------------------+----------+-----------------+--------+ Native artery inflow   207  1392                +--------------------+----------+-----------------+--------+ AVF Anastomosis        856                              +--------------------+----------+-----------------+--------+  +------------+----------+-------------+----------+----------------+ OUTFLOW VEINPSV (cm/s)Diameter (cm)Depth (cm)    Describe     +------------+----------+-------------+----------+----------------+ Mid UA         187        0.60        0.89                    +------------+----------+-------------+----------+----------------+ Dist UA        341        0.47        0.78   competing branch +------------+----------+-------------+----------+----------------+   Summary: Patent arteriovenous fistula. Left basilic vein branch noted in the distal upper arm. *See table(s) above for measurements and observations.  Diagnosing physician: Servando Snare MD Electronically signed by Servando Snare MD on 11/26/2018 at 10:27:48 AM.   --------------------------------------------------------------------------------   Final     Patient benefited maximally from their hospital stay and there were no complications.     Disposition:  Discharge disposition: 01-Home or Self Care      Discharge Instructions    Call MD / Call 911   Complete by:  As directed    If you experience chest pain or shortness of breath, CALL 911 and be transported to the hospital emergency room.  If you develope a fever above 101 F, pus (white drainage) or increased drainage or redness at the wound, or calf pain, call your surgeon's office.   Constipation Prevention   Complete by:  As directed    Drink plenty of fluids.  Prune juice may be helpful.  You may use a stool softener, such as Colace (over the counter) 100 mg twice a day.  Use MiraLax (over the counter) for constipation as needed.   Diet - low sodium heart healthy   Complete by:  As directed    Increase activity slowly as tolerated   Complete by:  As directed    Negative Pressure Wound Therapy - Incisional   Complete by:  As directed    Attach VAC dressing to the Praveena plus portable pump.  Make sure this is plugged in to maintain charge.     Follow-up Information    [name], MD In 1 week.   Specialty:  Orthopedic Surgery Contact information: [place] [address] [facility].   Why:  [phone]           Signed: [name] 12/20/2018, 6:59 AM

## 2018-12-20 NOTE — Plan of Care (Signed)

## 2018-12-22 MED FILL — AMLODIPINE BESYLATE 10 MG T: 10 | 90 days supply | Qty: 90 | Fill #0

## 2018-12-23 ENCOUNTER — Telehealth: Payer: Self-pay | Admitting: Orthopedic Surgery

## 2018-12-23 NOTE — Telephone Encounter (Signed)
I called and sw HHN to advise verbal ok for orders below. She also states that the pt has been disconnecting her vac because " the beeping is getting on her nerves" the nurse said that it was connected when she wen tout to see her and "only beeped a few times" but that the dressing was reinforced with coban. I called and lm on vm to advise we would like for her to come in today. Will hold message and continue to try and reach the pt.

## 2018-12-23 NOTE — Telephone Encounter (Signed)
Patient returned call and I informed her that she could come in and be seen today but she advised me that her wound vac was working properly now, she states she adjusted the cord, unplugged the machine and restarted it and its fine. She has made an appt to come in on Tues 12/27/18.

## 2018-12-23 NOTE — Telephone Encounter (Signed)
Tharon Aquas called for verbal orders for pt for physical therapy with [facility].  1 time a week for 3 weeks

## 2018-12-26 ENCOUNTER — Other Ambulatory Visit: Payer: Self-pay | Admitting: Internal Medicine

## 2018-12-26 DIAGNOSIS — I10 Essential (primary) hypertension: Secondary | ICD-10-CM

## 2018-12-27 ENCOUNTER — Inpatient Hospital Stay: Payer: Medicaid Other | Admitting: Orthopedic Surgery

## 2018-12-27 MED FILL — JULUCA 50-25 MG TAB: 50-25 | 30 days supply | Qty: 30 | Fill #1

## 2018-12-27 MED FILL — FUROSEMIDE 40 MG TAB: 40 | 90 days supply | Qty: 180 | Fill #0

## 2018-12-28 ENCOUNTER — Ambulatory Visit (INDEPENDENT_AMBULATORY_CARE_PROVIDER_SITE_OTHER): Payer: Medicaid Other | Admitting: Family

## 2018-12-28 ENCOUNTER — Inpatient Hospital Stay: Payer: Medicaid Other | Admitting: Family

## 2018-12-28 ENCOUNTER — Telehealth: Payer: Self-pay | Admitting: Physician Assistant

## 2018-12-28 ENCOUNTER — Encounter: Payer: Self-pay | Admitting: Family

## 2018-12-28 ENCOUNTER — Other Ambulatory Visit: Payer: Self-pay

## 2018-12-28 VITALS — Ht 65.0 in | Wt 165.0 lb

## 2018-12-28 DIAGNOSIS — Z89612 Acquired absence of left leg above knee: Secondary | ICD-10-CM

## 2018-12-28 MED ORDER — OXYCODONE-ACETAMINOPHEN 5-325 MG PO TABS: 1.0000 | ORAL_TABLET | Freq: Four times a day (QID) | ORAL | 0 refills | Status: DC | PRN

## 2018-12-28 NOTE — Telephone Encounter (Signed)
Patient called needing Rx refilled (Oxycodone) The number to contact patient is [phone]

## 2018-12-28 NOTE — Progress Notes (Signed)
Post-Op Visit Note   Patient: Kathy Frank           Date of Birth: 08/21/1975           MRN: [id number] Visit Date: 12/28/2018 PCP: Lars Mage, MD  Chief Complaint:  Chief Complaint  Patient presents with  . Left Leg - Routine Post Op    12/16/18 left AKA     HPI:  HPI Patient is a 43 year old woman who presents today for postoperative follow-up.  She is status post left above-the-knee amputation on May 8.  Wound VAC removed today. Ortho Exam Incision well approximated with sutures there is scant bloody drainage.  Minimal swelling no foul odor no maceration.  No sign of infection.  Visit Diagnoses:  1. 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  She will begin daily Dial soap cleansing dry dressing changes daily.  Provided an order to biotech for her to obtain a shrinker she may begin wearing this once obtained follow-up in 1 week for suture removal.  Follow-Up Instructions: Return in about 1 week (around 01/04/2019).   Imaging: No results found.  Orders:  No orders of the defined types were placed in this encounter.  Meds ordered this encounter  Medications  . oxyCODONE-acetaminophen (PERCOCET/ROXICET) 5-325 MG tablet    Sig: Take 1 tablet by mouth every 6 (six) hours as needed.    Dispense:  40 tablet    Refill:  0     PMFS History: Patient Active Problem List   Diagnosis Date Noted  . 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 Anemia due to chronic kidney disease   . Renal failure (ARF), acute on chronic ([place]) 11/24/2018  . Fall due to stumbling   . Osteomyelitis ([place]) 10/07/2018  . Dehiscence of amputation stump ([place])   . Moderate protein-calorie malnutrition ([place])   . Adjustment disorder with mixed anxiety and depressed mood 08/15/2018  . CKD (chronic kidney disease), stage V ([place])   . Acute on chronic anemia 07/03/2018  . Fecal incontinence 02/04/2018  . Healthcare  maintenance 09/17/2017  . Right leg swelling 02/26/2017  . Superficial ulcerative lesion ([place]) 02/02/2017  . Aortic atherosclerosis ([place]) 09/30/2016  . Diabetic foot ulcer associated with diabetes mellitus due to underlying condition ([place]) 07/08/2016  . Human immunodeficiency virus (HIV) disease ([place]) 04/23/2016  . Gastroparesis   . Moderate nonproliferative diabetic retinopathy of both eyes ([place]) 11/21/2014  . Esophageal reflux   . Microscopic hematuria 10/31/2014  . Hypertension 11/16/2013  . TOBACCO USER 06/28/2006  . Depression 06/28/2006  . Diabetes mellitus type 2 in obese [facility] 06/28/1994   Past Medical History:  Diagnosis Date  . Below-knee amputation of left lower extremity ([place])   . CKD (chronic kidney disease) stage 5, GFR less than 15 ml/min (HCC) 06/19/2016   06/30/16: Seen by Dr. Posey Pronto [Nephrology]. Suspect etiologies to include diabetes and hypertension though cannot exclude HIV-associated nephropathy. Advised her against from using excessive Goody powder.  . Contact dermatitis 04/21/2016   07/29/16: Skin biopsy performed by [facility] notable for acute spongiotic dermatitis consistent with contact dermatitis.  . Cyclical vomiting    THC induced???  . Depression 06/28/2006   Qualifier: Diagnosis of  By: Riccardo Dubin MD, [place]    . Diabetes mellitus type 2 in obese ([place]) 06/28/1994  . DKA (diabetic ketoacidoses) ([place]) 12/2014  . Erosive esophagitis   . Esophageal reflux   .  Essential hypertension 11/16/2013  . Gastroparesis    ? diabetic  . GERD (gastroesophageal reflux disease)   . History of blood transfusion   . Human immunodeficiency virus (HIV) disease ([place]) 04/23/2016  . Hypertension   . Moderate nonproliferative diabetic retinopathy of both eyes ([place]) 11/21/2014   11/14/14: Noted on retinal imaging; needs follow-up imaging in 6 months  05/22/16: Noted on retinal imaging again; needs follow-up imaging in 6 months  . Normocytic anemia   . TOBACCO USER  06/28/2006   Qualifier: Diagnosis of  By: Riccardo Dubin MD, Sherren Mocha    . Type II diabetes mellitus ([place]) 1994   diagnosed around 33  . Wound dehiscence   . Wound infection s/p L transmetatarsal amputation     Family History  Problem Relation Age of Onset  . Diabetes Mother   . Diabetes Brother   . Diabetes Daughter   . Diabetes Daughter   . Mental retardation Brother        died from PNA  . Diabetes Maternal Grandmother     Past Surgical History:  Procedure Laterality Date  . AMPUTATION Left 07/08/2018   Procedure: AMPUTATION FORTH RAY LEFT FOOT;  Surgeon: [name], MD;  Location: [facility];  Service: Orthopedics;  Laterality: Left;  . AMPUTATION Left 08/09/2018   Procedure: Left Transmetatarsal Amputation;  Surgeon: [name], MD;  Location: [facility];  Service: Orthopedics;  Laterality: Left;  . AMPUTATION Left 10/08/2018   Procedure: LEFT BELOW KNEE AMPUTATION;  Surgeon: [name], MD;  Location: [facility];  Service: Orthopedics;  Laterality: Left;  . AMPUTATION Left 10/28/2018   Procedure: REVISION BELOW KNEE AMPUTATION;  Surgeon: [name], MD;  Location: [facility];  Service: Orthopedics;  Laterality: Left;  . AMPUTATION Left 12/16/2018   Procedure: LEFT ABOVE KNEE AMPUTATION;  Surgeon: [name], MD;  Location: [facility];  Service: Orthopedics;  Laterality: Left;  . AV FISTULA PLACEMENT Left 10/14/2018   Procedure: Arteriovenous (Av) Fistula Creation Left Arm;  Surgeon: [name], MD;  Location: [facility];  Service: Vascular;  Laterality: Left;  . LOWER EXTREMITY ANGIOGRAPHY N/A 07/05/2018   Procedure: LOWER EXTREMITY ANGIOGRAPHY;  Surgeon: Serafina Mitchell, MD;  Location: [place] [facility] CV [facility];  Service: Cardiovascular;  Laterality: N/A;  . PERIPHERAL VASCULAR BALLOON ANGIOPLASTY Left 07/05/2018   Procedure: PERIPHERAL VASCULAR BALLOON ANGIOPLASTY;  Surgeon: Serafina Mitchell, MD;  Location: [facility] CV [facility];  Service: Cardiovascular;  Laterality: Left;  SFA  . STUMP  REVISION Left 10/19/2018   Procedure: REVISION LEFT BELOW KNEE AMPUTATION;  Surgeon: [name], MD;  Location: [facility];  Service: Orthopedics;  Laterality: Left;  . TUBAL LIGATION  2002   Social History   Occupational History    Employer: UNEMPLOYED  Tobacco Use  . Smoking status: Former Smoker    Packs/day: 0.10    Years: 10.00    Pack years: 1.00    Types: Cigarettes    Start date: 11/17/2013    Last attempt to quit: 09/09/2014    Years since quitting: 4.3  . Smokeless tobacco: Never Used  Substance and Sexual Activity  . Alcohol use: Yes    Comment: Occasionally.  . Drug use: Yes    Frequency: 4.0 times per week    Types: Marijuana  . Sexual activity: Not on file

## 2018-12-28 NOTE — Telephone Encounter (Signed)
Please advise Junie Panning

## 2018-12-30 NOTE — Addendum Note (Signed)
Addended by: Truddie Crumble on: 12/30/2018 10:01 AM   Modules accepted: Orders

## 2018-12-31 ENCOUNTER — Emergency Department (HOSPITAL_COMMUNITY): Payer: Medicaid Other

## 2018-12-31 ENCOUNTER — Telehealth: Payer: Self-pay | Admitting: Internal Medicine

## 2018-12-31 ENCOUNTER — Emergency Department (HOSPITAL_COMMUNITY)
Admission: EM | Admit: 2018-12-31 | Discharge: 2018-12-31 | Disposition: A | Discharge status: Home / Self Care | Payer: Medicaid Other | Attending: Emergency Medicine | Admitting: Emergency Medicine

## 2018-12-31 ENCOUNTER — Other Ambulatory Visit: Payer: Self-pay

## 2018-12-31 DIAGNOSIS — R197 Diarrhea, unspecified: Secondary | ICD-10-CM | POA: Insufficient documentation

## 2018-12-31 DIAGNOSIS — R112 Nausea with vomiting, unspecified: Secondary | ICD-10-CM | POA: Insufficient documentation

## 2018-12-31 DIAGNOSIS — R1084 Generalized abdominal pain: Secondary | ICD-10-CM

## 2018-12-31 LAB — COMPREHENSIVE METABOLIC PANEL
ALT: 7 U/L (ref 0–44)
AST: 12 U/L — ABNORMAL LOW (ref 15–41)
Albumin: 3.2 g/dL — ABNORMAL LOW (ref 3.5–5.0)
Alkaline Phosphatase: 103 U/L (ref 38–126)
Anion gap: 12 (ref 5–15)
BUN: 34 mg/dL — ABNORMAL HIGH (ref 6–20)
CO2: 20 mmol/L — ABNORMAL LOW (ref 22–32)
Calcium: 8.3 mg/dL — ABNORMAL LOW (ref 8.9–10.3)
Chloride: 110 mmol/L (ref 98–111)
Creatinine, Ser: 6.57 mg/dL — ABNORMAL HIGH (ref 0.44–1.00)
GFR calc Af Amer: 8 mL/min — ABNORMAL LOW (ref >60)
GFR calc non Af Amer: 7 mL/min — ABNORMAL LOW (ref >60)
Glucose, Bld: 180 mg/dL — ABNORMAL HIGH (ref 70–99)
Potassium: 4.4 mmol/L (ref 3.5–5.1)
Sodium: 142 mmol/L (ref 135–145)
Total Bilirubin: 0.6 mg/dL (ref 0.3–1.2)
Total Protein: 7.8 g/dL (ref 6.5–8.1)

## 2018-12-31 LAB — CBC WITH DIFFERENTIAL/PLATELET
Abs Immature Granulocytes: 0.01 10*3/uL (ref 0.00–0.07)
Basophils Absolute: 0.1 10*3/uL (ref 0.0–0.1)
Basophils Relative: 1 %
Eosinophils Absolute: 0.1 10*3/uL (ref 0.0–0.5)
Eosinophils Relative: 1 %
HCT: 28.8 % — ABNORMAL LOW (ref 36.0–46.0)
Hemoglobin: 9.1 g/dL — ABNORMAL LOW (ref 12.0–15.0)
Immature Granulocytes: 0 %
Lymphocytes Relative: 18 %
Lymphs Abs: 1.5 10*3/uL (ref 0.7–4.0)
MCH: 28.9 pg (ref 26.0–34.0)
MCHC: 31.6 g/dL (ref 30.0–36.0)
MCV: 91.4 fL (ref 80.0–100.0)
Monocytes Absolute: 0.5 10*3/uL (ref 0.1–1.0)
Monocytes Relative: 6 %
Neutro Abs: 5.8 10*3/uL (ref 1.7–7.7)
Neutrophils Relative %: 74 %
Platelets: 483 10*3/uL — ABNORMAL HIGH (ref 150–400)
RBC: 3.15 MIL/uL — ABNORMAL LOW (ref 3.87–5.11)
RDW: 16.3 % — ABNORMAL HIGH (ref 11.5–15.5)
WBC: 7.9 10*3/uL (ref 4.0–10.5)
nRBC: 0 % (ref 0.0–0.2)

## 2018-12-31 LAB — URINALYSIS, ROUTINE W REFLEX MICROSCOPIC

## 2018-12-31 LAB — URINALYSIS, MICROSCOPIC (REFLEX): RBC / HPF: >50 RBC/hpf (ref 0–5)

## 2018-12-31 LAB — LIPASE, BLOOD: Lipase: 27 U/L (ref 11–51)

## 2018-12-31 MED ORDER — SODIUM CHLORIDE 0.9 % IV BOLUS
500.0000 mL | Freq: Once | INTRAVENOUS | Status: AC
  Administered 2018-12-31: 500 mL via INTRAVENOUS

## 2018-12-31 MED ORDER — MORPHINE SULFATE (PF) 4 MG/ML IV SOLN
4.0000 mg | Freq: Once | INTRAVENOUS | Status: AC
  Administered 2018-12-31: 4 mg via INTRAVENOUS
  Filled 2018-12-31: qty 1

## 2018-12-31 MED ORDER — METOCLOPRAMIDE HCL 5 MG/ML IJ SOLN
10.0000 mg | Freq: Once | INTRAMUSCULAR | Status: AC
  Administered 2018-12-31: 10 mg via INTRAVENOUS
  Filled 2018-12-31: qty 2

## 2018-12-31 MED ORDER — METOCLOPRAMIDE HCL 5 MG PO TABS: 5.0000 mg | ORAL_TABLET | Freq: Two times a day (BID) | ORAL | 0 refills | Status: DC

## 2018-12-31 MED ORDER — ONDANSETRON HCL 4 MG/2ML IJ SOLN
4.0000 mg | Freq: Once | INTRAMUSCULAR | Status: AC
  Administered 2018-12-31: 4 mg via INTRAVENOUS
  Filled 2018-12-31: qty 2

## 2018-12-31 NOTE — ED Notes (Signed)
Pt refusing IV or lab sticks, requesting IV team "I had to get a line in my neck last time". Will page IV team.

## 2018-12-31 NOTE — Discharge Instructions (Addendum)
You have been seen today for nausea, vomiting, diarrhea, and abdominal pain. Please read and follow all provided instructions.   1. Medications: reglan for nausea/vomiting, usual home medications 2. Treatment: rest, drink plenty of fluids 3. Follow Up: Please follow up with your primary doctor in 2 days for discussion of your diagnoses and further evaluation after today's visit; if you do not have a primary care doctor use the resource guide provided to find one; Please return to the ER for any new or worsening symptoms. Please obtain all of your results from medical records or have your doctors office obtain the results - share them with your doctor - you should be seen at your doctors office. Call today to arrange your follow up.   Take medications as prescribed. Please review all of the medicines and only take them if you do not have an allergy to them. Return to the emergency room for worsening condition or new concerning symptoms. Follow up with your regular doctor. If you don't have a regular doctor use one of the numbers below to establish a primary care doctor.  Please be aware that if you are taking birth control pills, taking other prescriptions, ESPECIALLY ANTIBIOTICS may make the birth control ineffective - if this is the case, either do not engage in sexual activity or use alternative methods of birth control such as condoms until you have finished the medicine and your family doctor says it is OK to restart them. If you are on a blood thinner such as COUMADIN, be aware that any other medicine that you take may cause the coumadin to either work too much, or not enough - you should have your coumadin level rechecked in next 7 days if this is the case.  ?  It is also a possibility that you have an allergic reaction to any of the medicines that you have been prescribed - Everybody reacts differently to medications and while MOST people have no trouble with most medicines, you may have a reaction  such as nausea, vomiting, rash, swelling, shortness of breath. If this is the case, please stop taking the medicine immediately and contact your physician.  ?  You should return to the ER if you develop severe or worsening symptoms.   Emergency Department Resource Guide 1) Find a Doctor and Pay Out of Pocket Although you won't have to find out who is covered by your insurance plan, it is a good idea to ask around and get recommendations. You will then need to call the office and see if the doctor you have chosen will accept you as a new patient and what types of options they offer for patients who are self-pay. Some doctors offer discounts or will set up payment plans for their patients who do not have insurance, but you will need to ask so you aren't surprised when you get to your appointment.  2) Contact Your Local Health Department Not all health departments have doctors that can see patients for sick visits, but many do, so it is worth a call to see if yours does. If you don't know where your local health department is, you can check in your phone book. The CDC also has a tool to help you locate your state's health department, and many state websites also have listings of all of their local health departments.  3) Find a [facility] If your illness is not likely to be very severe or complicated, you may want to try a walk in  clinic. These are popping up all over the country in pharmacies, drugstores, and shopping centers. They're usually staffed by nurse practitioners or physician assistants that have been trained to treat common illnesses and complaints. They're usually fairly quick and inexpensive. However, if you have serious medical issues or chronic medical problems, these are probably not your best option.  No Primary Care Doctor: Call Health Connect at  [phone] - they can help you locate a primary care doctor that  accepts your insurance, provides certain services, etc. Physician  Referral Service- [phone]  Chronic Pain Problems: Organization         Address  Phone   Notes  [facility]  [phone] Patients need to be referred by their primary care doctor.   Medication Assistance: Organization         Address  Phone   Notes  [facility] Medication [facility] [address] [place], [place] [address] [phone] --Must be a resident of [facility] -- Must have NO insurance coverage whatsoever (no Medicaid/ Medicare, etc.) -- The pt. MUST have a primary care doctor that directs their care regularly and follows them in the community   [facility]  [phone]   [employer]  [phone]    Agencies that provide inexpensive medical care: Organization         Address  Phone   Notes  [place]  [phone]   [facility]    [phone]   [facility] [place], [place] [address] [phone]   [place] [address], [place] [phone]   [facility]    [phone]   [facility]    [phone]   [place] and [address], [place] Phone:  [phone], Fax:  [phone] Hours of Operation:  9 am - 6 pm, M-F.  Also accepts Medicaid/Medicare and self-pay.  [facility] for [place] [address] [place], [address], [place] Phone: [phone], Fax: [phone]. Hours of Operation:  8:30 am - 5:30 pm, M-F.  Also accepts Medicaid and self-pay.  [facility] [place] [address] [place] Phone: [phone]   [place], [place], [place] [phone], Ext. [phone] Mondays & Thursdays: 7-9 AM.  First 15 patients are seen on a first come, first serve basis.    [place] Providers:  Organization         Address  Phone   Notes  [facility] [address], [place] [phone] Also accepts self-pay patients.  [facility] [address], [place]  [phone]   [place], [address], [place] [phone]   [facility] [address] [place] [phone]   Lucianne Lei [address], [place]   [phone] Only accepts [place] patients after they have their name applied to their card.   Self-Pay (no insurance) in [facility]:  Organization         Address  Phone   Notes  Sickle Cell Patients, [facility] [place] [phone]   [facility] [place] [phone]   [facility] [place]  [place], [address], [place] [phone]   [facility]/Dr. Osei-Bonsu  [address], [place] or [address], [place] [phone] Phone number for both [place] and [place] locations is the same.  [facility] [address], [place] [phone]   [facility] [address] [place] [address] [place] or [address] [phone]   [facility] [address], [place] [phone], phone; [phone], fax Sees patients 1st and 3rd Saturday of every month.  Must not qualify for public or private insurance (i.e. Medicaid, Medicare, [place] Health Choice, Veterans' Benefits)  Household income should be no more than 200% of the poverty level The clinic cannot treat you if you are pregnant or think you are pregnant  Sexually transmitted diseases are not treated at the clinic.

## 2018-12-31 NOTE — ED Notes (Signed)
Blood never collect,  Need to draw labs.

## 2018-12-31 NOTE — ED Provider Notes (Cosign Needed)
[facility] EMERGENCY DEPARTMENT Provider Note   CSN: [id number] Arrival date & time: 12/31/18  1432    History   Chief Complaint Chief Complaint  Patient presents with   Emesis   Diarrhea   Abdominal Pain    HPI Kathy Frank is a 43 y.o. female presenting for evaluation of nausea, vomiting, abdominal pain, and diarrhea.  Patient states her symptoms began 3 days ago.  It began with nausea and vomiting.  She has been unable to tolerate any p.o. or medication for the past several days.  Since then, she is developed pain which she describes as going from her stomach to her throat.  Additionally, she reports low back pain, worse on the left side.  Pain is constant and severe.  She has not been able to take anything for this due to vomiting.  Patient reports vomiting 20 times today, states it is nonbloody nonbilious.  She reports mild diarrhea, has had 2-3 bowel movements a day.  No blood in her stool.  She denies fevers, cough, chest pain, shortness of breath, or abnormal urination.  Patient has a history of diabetes, states that her blood sugar is normally well controlled with an A1c of 6.2.  However the past several days her blood sugars have been elevated around 200.  She has a history of HIV, last viral load was undetectable.  History of hypertension, has not been able to keep her medications down.  History of left AKA, last revision was 2 weeks ago.  Patient states he has been healing well, no pain or purulence at the site.  No tachycardia.  Patient with history of DKA, gastroparesis, cyclical vomiting.     HPI  Past Medical History:  Diagnosis Date   Below-knee amputation of left lower extremity (HCC)    CKD (chronic kidney disease) stage 5, GFR less than 15 ml/min ([place]) 06/19/2016   06/30/16: Seen by Dr. Posey Pronto [Nephrology]. Suspect etiologies to include diabetes and hypertension though cannot exclude HIV-associated nephropathy. Advised her against from  using excessive Goody powder.   Contact dermatitis 04/21/2016   07/29/16: Skin biopsy performed by [facility] notable for acute spongiotic dermatitis consistent with contact dermatitis.   Cyclical vomiting    THC induced???   Depression 06/28/2006   Qualifier: Diagnosis of  By: Riccardo Dubin MD, Todd     Diabetes mellitus type 2 in obese ([place]) 06/28/1994   DKA (diabetic ketoacidoses) ([place]) 12/2014   Erosive esophagitis    Esophageal reflux    Essential hypertension 11/16/2013   Gastroparesis    ? diabetic   GERD (gastroesophageal reflux disease)    History of blood transfusion    Human immunodeficiency virus (HIV) disease ([place]) 04/23/2016   Hypertension    Moderate nonproliferative diabetic retinopathy of both eyes ([place]) 11/21/2014   11/14/14: Noted on retinal imaging; needs follow-up imaging in 6 months  05/22/16: Noted on retinal imaging again; needs follow-up imaging in 6 months   Normocytic anemia    TOBACCO USER 06/28/2006   Qualifier: Diagnosis of  By: Riccardo Dubin MD, Todd     Type II diabetes mellitus ([place]) 1994   diagnosed around 1994   Wound dehiscence    Wound infection s/p L transmetatarsal amputation     Patient Active Problem List   Diagnosis Date Noted   Acquired absence of left lower extremity above knee ([place]) 12/16/2018   Anemia due to chronic kidney disease    Renal failure (ARF), acute on chronic ([place]) 11/24/2018  Fall due to stumbling    Osteomyelitis ([place]) 10/07/2018   Dehiscence of amputation stump (HCC)    Moderate protein-calorie malnutrition (HCC)    Adjustment disorder with mixed anxiety and depressed mood 08/15/2018   CKD (chronic kidney disease), stage V (HCC)    Acute on chronic anemia 07/03/2018   Fecal incontinence 02/04/2018   Healthcare maintenance 09/17/2017   Right leg swelling 02/26/2017   Superficial ulcerative lesion ([place]) 02/02/2017   Aortic atherosclerosis ([place]) 09/30/2016   Diabetic foot ulcer  associated with diabetes mellitus due to underlying condition ([place]) 07/08/2016   Human immunodeficiency virus (HIV) disease ([place]) 04/23/2016   Gastroparesis    Moderate nonproliferative diabetic retinopathy of both eyes ([place]) 11/21/2014   Esophageal reflux    Microscopic hematuria 10/31/2014   Hypertension 11/16/2013   TOBACCO USER 06/28/2006   Depression 06/28/2006   Diabetes mellitus type 2 in obese ([place]) 06/28/1994    Past Surgical History:  Procedure Laterality Date   AMPUTATION Left 07/08/2018   Procedure: AMPUTATION FORTH RAY LEFT FOOT;  Surgeon: [name], MD;  Location: [facility];  Service: Orthopedics;  Laterality: Left;   AMPUTATION Left 08/09/2018   Procedure: Left Transmetatarsal Amputation;  Surgeon: [name], MD;  Location: [facility];  Service: Orthopedics;  Laterality: Left;   AMPUTATION Left 10/08/2018   Procedure: LEFT BELOW KNEE AMPUTATION;  Surgeon: [name], MD;  Location: [facility];  Service: Orthopedics;  Laterality: Left;   AMPUTATION Left 10/28/2018   Procedure: REVISION BELOW KNEE AMPUTATION;  Surgeon: [name], MD;  Location: [facility];  Service: Orthopedics;  Laterality: Left;   AMPUTATION Left 12/16/2018   Procedure: LEFT ABOVE KNEE AMPUTATION;  Surgeon: [name], MD;  Location: [facility];  Service: Orthopedics;  Laterality: Left;   AV FISTULA PLACEMENT Left 10/14/2018   Procedure: Arteriovenous (Av) Fistula Creation Left Arm;  Surgeon: [name], MD;  Location: [facility];  Service: Vascular;  Laterality: Left;   LOWER EXTREMITY ANGIOGRAPHY N/A 07/05/2018   Procedure: LOWER EXTREMITY ANGIOGRAPHY;  Surgeon: Serafina Mitchell, MD;  Location: [facility];  Service: Cardiovascular;  Laterality: N/A;   PERIPHERAL VASCULAR BALLOON ANGIOPLASTY Left 07/05/2018   Procedure: PERIPHERAL VASCULAR BALLOON ANGIOPLASTY;  Surgeon: Serafina Mitchell, MD;  Location: [facility];  Service: Cardiovascular;  Laterality: Left;  SFA   STUMP  REVISION Left 10/19/2018   Procedure: REVISION LEFT BELOW KNEE AMPUTATION;  Surgeon: [name], MD;  Location: [facility];  Service: Orthopedics;  Laterality: Left;   TUBAL LIGATION  2002     OB History   No obstetric history on file.      Home Medications    Prior to Admission medications   Medication Sig Start Date End Date Taking? Authorizing Provider  amLODipine (NORVASC) 10 MG tablet TAKE 1 TABLET BY MOUTH DAILY. 12/26/18   Chundi, Verne Spurr, MD  Aspirin-Salicylamide-Caffeine (BC HEADACHE POWDER PO) Take 1 packet by mouth as needed (for headaches).    [provider]  calcitRIOL (ROCALTROL) 0.25 MCG capsule Take 1 capsule (0.25 mcg total) by mouth daily. 10/17/18   [name], MD  calcium acetate (PHOSLO) 667 MG capsule Take 2 capsules (1,334 mg total) by mouth 3 (three) times daily with meals. 10/16/18   [name], MD  citalopram (CELEXA) 20 MG tablet Take 1 tablet (20 mg total) by mouth daily. 08/12/18 10/27/27  Carroll Sage, MD  furosemide (LASIX) 40 MG tablet TAKE 1 TABLET (40 MG TOTAL) BY  MOUTH 2 (TWO) TIMES DAILY. 12/20/18   Chundi, Verne Spurr, MD  gabapentin (NEURONTIN) 300 MG capsule Take 1 capsule (300 mg total) by mouth 2 (two) times daily. 11/17/18   Rayburn, Neta Mends, PA-C  glucose blood (ACCU-CHEK GUIDE) test strip USE TO TEST BLOOD SUGAR 3 TIMES DAILY . Diagnosis code  E11.69, E66.9 07/09/17   Lars Mage, MD  Insulin Glargine (LANTUS) 100 UNIT/ML Solostar Pen Inject 5 Units into the skin at bedtime. 10/16/18   [name], MD  JULUCA 50-25 MG TABS TAKE 1 TABLET BY MOUTH DAILY. PLEASE TAKE WITH LARGEST MEAL OF THE DAY. Patient taking differently: Take 1 tablet by mouth daily with breakfast.  11/11/18 02/09/19  Lars Mage, MD  labetalol (NORMODYNE) 300 MG tablet Take 1 tablet (300 mg total) by mouth 2 (two) times daily. 11/26/18   Seawell, Jaimie A, DO  liraglutide (VICTOZA) 18 MG/3ML SOPN Inject 0.2 mLs (1.2 mg total) into the skin  daily. Patient taking differently: Inject 1.2 mg into the skin at bedtime.  02/04/18   Chundi, Verne Spurr, MD  megestrol (MEGACE) 40 MG tablet Take 1 tablet (40 mg total) by mouth daily. 10/21/18   [name], MD  methocarbamol (ROBAXIN) 500 MG tablet Take 1 tablet (500 mg total) by mouth every 6 (six) hours as needed for muscle spasms. 10/21/18   [name], MD  nutrition supplement, JUVEN, (JUVEN) PACK Take 1 packet by mouth 2 (two) times daily between meals. 10/16/18   [name], MD  ondansetron (ZOFRAN) 4 MG tablet Take 1 tablet (4 mg total) by mouth daily as needed for nausea or vomiting. 08/19/18 08/19/19  Alphonzo Grieve, MD  oxyCODONE-acetaminophen (PERCOCET/ROXICET) 5-325 MG tablet Take 1 tablet by mouth every 6 (six) hours as needed. 12/28/18   Suzan Slick, NP  polyethylene glycol (MIRALAX / GLYCOLAX) packet Take 17 g by mouth daily. Patient taking differently: Take 17 g by mouth daily as needed for mild constipation.  08/12/18   Carroll Sage, MD  senna (SENOKOT) 8.6 MG TABS tablet Take 2 tablets (17.2 mg total) by mouth 2 (two) times daily. 08/12/18   Carroll Sage, MD  sodium bicarbonate 650 MG tablet Take 2 tablets (1,300 mg total) by mouth 2 (two) times daily. Patient taking differently: Take 650 mg by mouth 2 (two) times daily.  10/16/18   [name], MD    Family History Family History  Problem Relation Age of Onset   Diabetes Mother    Diabetes Brother    Diabetes Daughter    Diabetes Daughter    Mental retardation Brother        died from PNA   Diabetes Maternal Grandmother     Social History Social History   Tobacco Use   Smoking status: Former Smoker    Packs/day: 0.10    Years: 10.00    Pack years: 1.00    Types: Cigarettes    Start date: 11/17/2013    Last attempt to quit: 09/09/2014    Years since quitting: 4.3   Smokeless tobacco: Never Used  Substance Use Topics   Alcohol use: Yes    Comment: Occasionally.   Drug  use: Yes    Frequency: 4.0 times per week    Types: Marijuana     Allergies   Patient has no known allergies.   Review of Systems Review of Systems  Gastrointestinal: Positive for nausea and vomiting.  Musculoskeletal: Positive for back pain.  All other systems reviewed and are negative.  Physical Exam Updated Vital Signs BP (!) 197/107    Pulse 100    Temp 98.3 F (36.8 C) (Oral)    Resp 18    Ht 5\' 5"  (1.651 m)    Wt 74.8 kg    LMP 12/08/2018 Comment: tubal liagation   SpO2 100%    BMI 27.46 kg/m   Physical Exam Vitals signs and nursing note reviewed.  Constitutional:      General: She is not in acute distress.    Appearance: She is well-developed.     Comments: Appears uncomfortable due to pain, nontoxic in appearance.  HENT:     Head: Normocephalic and atraumatic.  Eyes:     Extraocular Movements: Extraocular movements intact.     Conjunctiva/sclera: Conjunctivae normal.     Pupils: Pupils are equal, round, and reactive to light.  Neck:     Musculoskeletal: Normal range of motion and neck supple.  Cardiovascular:     Rate and Rhythm: Normal rate and regular rhythm.     Pulses: Normal pulses.  Pulmonary:     Effort: Pulmonary effort is normal. No respiratory distress.     Breath sounds: Normal breath sounds. No wheezing.  Abdominal:     General: There is no distension.     Palpations: Abdomen is soft.     Tenderness: There is abdominal tenderness. There is no guarding or rebound.     Comments: Tenderness palpation of epigastric and suprapubic abdomen.  CVA tenderness on the right side.  Minimal tenderness to the low back on the left side.  Musculoskeletal: Normal range of motion.  Skin:    General: Skin is warm and dry.     Capillary Refill: Capillary refill takes less than 2 seconds.  Neurological:     Mental Status: She is alert and oriented to person, place, and time.  Psychiatric:        Mood and Affect: Mood normal.      ED Treatments / Results   Labs (all labs ordered are listed, but only abnormal results are displayed) Labs Reviewed  URINE CULTURE  C DIFFICILE QUICK SCREEN W PCR REFLEX  CBC WITH DIFFERENTIAL/PLATELET  COMPREHENSIVE METABOLIC PANEL  LIPASE, BLOOD  URINALYSIS, ROUTINE W REFLEX MICROSCOPIC  CBG MONITORING, ED    EKG None  Radiology No results found.  Procedures Procedures (including critical care time)  Medications Ordered in ED Medications  morphine 4 MG/ML injection 4 mg (has no administration in time range)  ondansetron (ZOFRAN) injection 4 mg (has no administration in time range)     Initial Impression / Assessment and Plan / ED Course  I have reviewed the triage vital signs and the nursing notes.  Pertinent labs & imaging results that were available during my care of the patient were reviewed by me and considered in my medical decision making (see chart for details).        Patient presenting for evaluation nausea, vomiting, abdominal pain.  Physical exam shows patient who appears uncomfortable, but nontoxic.  She is afebrile not tachycardic.  Abdominal pain is epigastric and suprapubic.  Additionally, CVA tenderness on the right side, although also having low left back pain.  As such, consider gastroparesis, cyclical vomiting, DKA, pancreatitis, Pyelo, UTI, C. Difficile.  Will obtain labs, urine, and CT for further evaluation.  Zofran and morphine given for symptom control.  Patient signed out to A Jerilee Hoh, PA-C for follow-up on labs and imaging.  Final Clinical Impressions(s) / ED Diagnoses   Final diagnoses:  None    ED Discharge Orders    None       Franchot Heidelberg, PA-C 12/31/18 1635

## 2018-12-31 NOTE — ED Notes (Signed)
Discharge instructions reviewed with pt. Pt. Verbalized understanding and has no questions at this time. Discharged home with daughter

## 2018-12-31 NOTE — Telephone Encounter (Signed)
   Reason for call:   I received a call from [name] at 76  indicating she was having n/v consistent with how she feels when her diabetic gastroparesis was acting up.  It has been occurring for about 3 days.     Pertinent Data:   CKD 5, T2DM   Assessment / Plan / Recommendations:   Pt already on her way to the ED when she called me will be evaluated there.    As always, pt is advised that if symptoms worsen or new symptoms arise, they should go to an urgent care facility or to to ER for further evaluation.   Kathy Roan, MD   12/31/2018, 2:20 PM

## 2018-12-31 NOTE — ED Triage Notes (Signed)
Pt in with c/o emesis x 3 days, mild abdominal pain and diarrhea. Denies any fevers

## 2018-12-31 NOTE — ED Provider Notes (Addendum)
Care assumed from [facility], [place].  Please see her full H&P.  In short,  Kathy Frank is a 43 y.o. female with a PMH of CKD, HTN, T2DM, HIV at undetectable levels, and L AKA presents for epigastric/suprapubic abdominal pain, emesis, and diarrhea onset 3 days ago.   Labs and CT abdomen are pending. Morphine and zofran have been ordered.   No acute abdominal or pelvic pathology noted on CT abdomen. WBCs are within normal limits.  RBCs noted in UA. Patient reports she is on her menstrual cycle. Creatinine elevated at 6.57. This appears to be similar to previous values. Patient has CKD. Low hemoglobin noted at 9.1. This appears to be similar to previous values.  Patient reports symptoms have significantly improved and patient has been able to tolerate fluids without difficulty. Patient states she has not had any more diarrhea and cannot provide a stool sample for C diff testing. Will prescribe antiemetics and advised patient to follow up with PCP in 2 days. Discussed important of avoiding marijuana use due to exacerbation of symptoms. Discussed return precautions. Discussed elevated BP during today's visit. Patient denies any symptoms of hypertensive crisis. Advised patient to follow up with PCP. Patient states she understands and agrees with plan. Patient feels comfortable with being discharged at this time. Patient is stable in no acute distress.  Findings and plan of care discussed with supervising physician Dr. Ellender Hose.   Arville Lime, [place] 12/31/18 2136    Arville Lime, PA-C 12/31/18 2137    Duffy Bruce, MD 01/02/19 1022

## 2019-01-02 LAB — URINE CULTURE

## 2019-01-04 ENCOUNTER — Telehealth: Payer: Self-pay | Admitting: Orthopedic Surgery

## 2019-01-04 NOTE — Telephone Encounter (Signed)
Pt complains of increased pain of AKA despite using Neurontin and percocet. Had appt to see shawn on Friday. Moved up to tomorrow at 1:15 can you open slot for this pt please?

## 2019-01-04 NOTE — Telephone Encounter (Signed)
Pt called requesting a call from Dr. Sharol Given assistant.  Concerning her Leg.

## 2019-01-05 ENCOUNTER — Other Ambulatory Visit: Payer: Self-pay

## 2019-01-05 ENCOUNTER — Ambulatory Visit (INDEPENDENT_AMBULATORY_CARE_PROVIDER_SITE_OTHER): Payer: Medicaid Other | Admitting: Orthopedic Surgery

## 2019-01-05 ENCOUNTER — Telehealth: Payer: Self-pay

## 2019-01-05 ENCOUNTER — Encounter: Payer: Self-pay | Admitting: Orthopedic Surgery

## 2019-01-05 VITALS — Ht 65.0 in | Wt 165.0 lb

## 2019-01-05 DIAGNOSIS — E440 Moderate protein-calorie malnutrition: Secondary | ICD-10-CM

## 2019-01-05 DIAGNOSIS — E1142 Type 2 diabetes mellitus with diabetic polyneuropathy: Secondary | ICD-10-CM

## 2019-01-05 DIAGNOSIS — Z794 Long term (current) use of insulin: Secondary | ICD-10-CM

## 2019-01-05 DIAGNOSIS — Z89612 Acquired absence of left leg above knee: Secondary | ICD-10-CM

## 2019-01-05 DIAGNOSIS — N185 Chronic kidney disease, stage 5: Secondary | ICD-10-CM

## 2019-01-05 MED ORDER — OXYCODONE-ACETAMINOPHEN 5-325 MG PO TABS: 1.0000 | ORAL_TABLET | Freq: Four times a day (QID) | ORAL | 0 refills | Status: DC | PRN

## 2019-01-05 NOTE — Progress Notes (Signed)
Office Visit Note   Patient: Kathy Frank           Date of Birth: 06-07-1976           MRN: [id number] Visit Date: 01/05/2019              Requested by: Lars Mage, MD [address] [place], [place] [address] PCP: Lars Mage, MD  Chief Complaint  Patient presents with  . Left Leg - Routine Post Op    12/16/2018 left AKA      HPI: The patient is a 43 yo woman who is seen for post operative follow up following left above the knee amputation on 12/16/2018. She is about 3 weeks post op. She has an appointment to be fitted with a shrinker stocking at [employer] for next Monday. She has noted slight blood on her dry dressings. She reports moderate pain over the amputation site and requests a refill.    Assessment & Plan: Visit Diagnoses:  1. 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 Type 2 diabetes mellitus with diabetic polyneuropathy, with long-term current use of insulin ([place])   3. CKD (chronic kidney disease), stage V ([place])   4. Moderate protein malnutrition ([place])     Plan: Leave sutures in for an additional week as still under some tension. Dry dressing to site and wash daily with soap and water. Refilled Percocet. Follow up in 1 week for suture removal.   Follow-Up Instructions: Return in about 1 week (around 01/12/2019).   Ortho Exam  Patient is alert, oriented, no adenopathy, well-dressed, normal affect, normal respiratory effort. Left above the knee amputation site incision is intact. Scant heme drainage on dressings. No signs of cellulitis or infection over the flaps. Sutures under tension and moderate edema.   Imaging: No results found.    Labs: Lab Results  Component Value Date   HGBA1C 6.2 (H) 11/24/2018   HGBA1C 6.5 (H) 08/06/2018   HGBA1C 7.9 (A) 02/04/2018   ESRSEDRATE 21 08/06/2018   ESRSEDRATE 85 (H) 02/02/2017   CRP <0.8 10/07/2018   CRP <0.8 08/06/2018   CRP 42.4 (H) 02/02/2017   REPTSTATUS 01/02/2019 FINAL 12/31/2018   GRAMSTAIN  10/07/2018    NO WBC SEEN RARE GRAM POSITIVE COCCI IN PAIRS FEW GRAM POSITIVE RODS    CULT MULTIPLE SPECIES PRESENT, SUGGEST RECOLLECTION (A) 12/31/2018   LABORGA STAPHYLOCOCCUS AUREUS 07/08/2018     Lab Results  Component Value Date   ALBUMIN 3.2 (L) 12/31/2018   ALBUMIN 3.8 12/16/2018   ALBUMIN 2.8 (L) 11/26/2018    Body mass index is 27.46 kg/m.  Orders:  No orders of the defined types were placed in this encounter.  Meds ordered this encounter  Medications  . oxyCODONE-acetaminophen (PERCOCET/ROXICET) 5-325 MG tablet    Sig: Take 1 tablet by mouth every 6 (six) hours as needed.    Dispense:  40 tablet    Refill:  0     Procedures: No procedures performed  Clinical Data: No additional findings.  ROS:  All other systems negative, except as noted in the HPI. Review of Systems  Objective: Vital Signs: Ht 5\' 5"  (1.651 m)   Wt 165 lb (74.8 kg)   LMP 12/08/2018 Comment: tubal liagation  BMI 27.46 kg/m   Specialty Comments:  No specialty comments available.  PMFS History: Patient Active Problem List   Diagnosis Date Noted  . 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 Anemia  due to chronic kidney disease   . Renal failure (ARF), acute on chronic ([place]) 11/24/2018  . Fall due to stumbling   . Osteomyelitis ([place]) 10/07/2018  . Dehiscence of amputation stump ([place])   . Moderate protein-calorie malnutrition ([place])   . Adjustment disorder with mixed anxiety and depressed mood 08/15/2018  . CKD (chronic kidney disease), stage V ([place])   . Acute on chronic anemia 07/03/2018  . Fecal incontinence 02/04/2018  . Healthcare maintenance 09/17/2017  . Right leg swelling 02/26/2017  . Superficial ulcerative lesion ([place]) 02/02/2017  . Aortic atherosclerosis ([place]) 09/30/2016  . Diabetic foot ulcer associated with diabetes mellitus due to underlying condition ([place]) 07/08/2016  . Human immunodeficiency virus (HIV) disease ([place])  04/23/2016  . Gastroparesis   . Moderate nonproliferative diabetic retinopathy of both eyes ([place]) 11/21/2014  . Esophageal reflux   . Microscopic hematuria 10/31/2014  . Hypertension 11/16/2013  . TOBACCO USER 06/28/2006  . Depression 06/28/2006  . Diabetes mellitus type 2 in obese [facility]) 06/28/1994   Past Medical History:  Diagnosis Date  . Below-knee amputation of left lower extremity ([place])   . CKD (chronic kidney disease) stage 5, GFR less than 15 ml/min (HCC) 06/19/2016   06/30/16: Seen by Dr. Posey Pronto [Nephrology]. Suspect etiologies to include diabetes and hypertension though cannot exclude HIV-associated nephropathy. Advised her against from using excessive Goody powder.  . Contact dermatitis 04/21/2016   07/29/16: Skin biopsy performed by [facility] notable for acute spongiotic dermatitis consistent with contact dermatitis.  . Cyclical vomiting    THC induced???  . Depression 06/28/2006   Qualifier: Diagnosis of  By: Riccardo Dubin MD, Todd    . Diabetes mellitus type 2 in obese ([place]) 06/28/1994  . DKA (diabetic ketoacidoses) ([place]) 12/2014  . Erosive esophagitis   . Esophageal reflux   . Essential hypertension 11/16/2013  . Gastroparesis    ? diabetic  . GERD (gastroesophageal reflux disease)   . History of blood transfusion   . Human immunodeficiency virus (HIV) disease ([place]) 04/23/2016  . Hypertension   . Moderate nonproliferative diabetic retinopathy of both eyes ([place]) 11/21/2014   11/14/14: Noted on retinal imaging; needs follow-up imaging in 6 months  05/22/16: Noted on retinal imaging again; needs follow-up imaging in 6 months  . Normocytic anemia   . TOBACCO USER 06/28/2006   Qualifier: Diagnosis of  By: Riccardo Dubin MD, Sherren Mocha    . Type II diabetes mellitus ([place]) 1994   diagnosed around 66  . Wound dehiscence   . Wound infection s/p L transmetatarsal amputation     Family History  Problem Relation Age of Onset  . Diabetes Mother   . Diabetes Brother   . Diabetes  Daughter   . Diabetes Daughter   . Mental retardation Brother        died from PNA  . Diabetes Maternal Grandmother     Past Surgical History:  Procedure Laterality Date  . AMPUTATION Left 07/08/2018   Procedure: AMPUTATION FORTH RAY LEFT FOOT;  Surgeon: [name], MD;  Location: [facility];  Service: Orthopedics;  Laterality: Left;  . AMPUTATION Left 08/09/2018   Procedure: Left Transmetatarsal Amputation;  Surgeon: [name], MD;  Location: [facility];  Service: Orthopedics;  Laterality: Left;  . AMPUTATION Left 10/08/2018   Procedure: LEFT BELOW KNEE AMPUTATION;  Surgeon: [name], MD;  Location: [facility];  Service: Orthopedics;  Laterality: Left;  . AMPUTATION Left 10/28/2018   Procedure: REVISION BELOW KNEE AMPUTATION;  Surgeon: Meridee Score  V, MD;  Location: [facility];  Service: Orthopedics;  Laterality: Left;  . AMPUTATION Left 12/16/2018   Procedure: LEFT ABOVE KNEE AMPUTATION;  Surgeon: [name], MD;  Location: [place];  Service: Orthopedics;  Laterality: Left;  . AV FISTULA PLACEMENT Left 10/14/2018   Procedure: Arteriovenous (Av) Fistula Creation Left Arm;  Surgeon: [name], MD;  Location: [facility];  Service: Vascular;  Laterality: Left;  . LOWER EXTREMITY ANGIOGRAPHY N/A 07/05/2018   Procedure: LOWER EXTREMITY ANGIOGRAPHY;  Surgeon: Serafina Mitchell, MD;  Location: [facility] [place];  Service: Cardiovascular;  Laterality: N/A;  . PERIPHERAL VASCULAR BALLOON ANGIOPLASTY Left 07/05/2018   Procedure: PERIPHERAL VASCULAR BALLOON ANGIOPLASTY;  Surgeon: Serafina Mitchell, MD;  Location: [facility];  Service: Cardiovascular;  Laterality: Left;  SFA  . STUMP REVISION Left 10/19/2018   Procedure: REVISION LEFT BELOW KNEE AMPUTATION;  Surgeon: [name], MD;  Location: [facility];  Service: Orthopedics;  Laterality: Left;  . TUBAL LIGATION  2002   Social History   Occupational History    Employer: UNEMPLOYED  Tobacco Use  . Smoking status: Former Smoker    Packs/day:  0.10    Years: 10.00    Pack years: 1.00    Types: Cigarettes    Start date: 11/17/2013    Last attempt to quit: 09/09/2014    Years since quitting: 4.3  . Smokeless tobacco: Never Used  Substance and Sexual Activity  . Alcohol use: Yes    Comment: Occasionally.  . Drug use: Yes    Frequency: 4.0 times per week    Types: Marijuana  . Sexual activity: Not on file

## 2019-01-05 NOTE — Telephone Encounter (Signed)
Patient called stated pharmacy advised rx needed prior auth  For medication Contact for patient.  [phone]

## 2019-01-05 NOTE — Telephone Encounter (Signed)
Prior auth submitted through [place] [facility] and this was approved. Prior auth # [id number]. Advised that she can have this filled at the pharm and to call with any questions.

## 2019-01-06 ENCOUNTER — Ambulatory Visit: Payer: Medicaid Other | Admitting: Physician Assistant

## 2019-01-07 ENCOUNTER — Telehealth: Payer: Self-pay | Admitting: Adult Health

## 2019-01-07 NOTE — Telephone Encounter (Signed)
LEft message on voicemail regarding recent orthocare visit and possibility of further testing. Number given to call back.

## 2019-01-12 ENCOUNTER — Ambulatory Visit: Payer: Medicaid Other | Admitting: Orthopedic Surgery

## 2019-01-13 ENCOUNTER — Other Ambulatory Visit: Payer: Self-pay

## 2019-01-13 ENCOUNTER — Encounter: Payer: Self-pay | Admitting: Physician Assistant

## 2019-01-13 ENCOUNTER — Ambulatory Visit (INDEPENDENT_AMBULATORY_CARE_PROVIDER_SITE_OTHER): Payer: Medicaid Other | Admitting: Physician Assistant

## 2019-01-13 VITALS — Ht 65.0 in | Wt 165.0 lb

## 2019-01-13 DIAGNOSIS — Z89612 Acquired absence of left leg above knee: Secondary | ICD-10-CM

## 2019-01-13 DIAGNOSIS — Z794 Long term (current) use of insulin: Secondary | ICD-10-CM

## 2019-01-13 DIAGNOSIS — E440 Moderate protein-calorie malnutrition: Secondary | ICD-10-CM

## 2019-01-13 DIAGNOSIS — N185 Chronic kidney disease, stage 5: Secondary | ICD-10-CM

## 2019-01-13 DIAGNOSIS — E1142 Type 2 diabetes mellitus with diabetic polyneuropathy: Secondary | ICD-10-CM

## 2019-01-13 MED ORDER — AMOXICILLIN-POT CLAVULANATE 875-125 MG PO TABS: 1.0000 | ORAL_TABLET | Freq: Two times a day (BID) | ORAL | Status: DC

## 2019-01-13 NOTE — Progress Notes (Signed)
Office Visit Note   Patient: Kathy Frank           Date of Birth: 1976/06/21           MRN: [id number] Visit Date: 01/13/2019              Requested by: Lars Mage, MD [address] [place], [place] [address] PCP: Lars Mage, MD  Chief Complaint  Patient presents with  . Left Leg - Routine Post Op    12/16/18 left AKA      HPI: The patient is a 44 yo woman who is seen for post operative follow up following a left above the knee amputation on 12/16/2018.  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 She has noted drainage and increased swelling over the left above the knee amputation. She has been on several courses of Doxycycline.  She has not started hemodialysis yet as her kidney function has been holding steady and dialysis is not yet necessary. She reports her blood sugars are doing much better, but she cannot afford the protein supplements.   Assessment & Plan: Visit Diagnoses:  1. 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 Type 2 diabetes mellitus with diabetic polyneuropathy, with long-term current use of insulin ([place])   3. CKD (chronic kidney disease), stage V ([place])   4. Moderate protein malnutrition ([place])     Plan: Drainage from the left AKA site was cultured today. Discussed that she may require revision of AKA.  Will begin Augmentin 875-125 mg BID.  Advised to use shrinker stocking to help decrease edema over a dry dressing.  Follow up Monday.   Follow-Up Instructions: Return in about 3 days (around 01/16/2019).   Ortho Exam  Patient is alert, oriented, no adenopathy, well-dressed, normal affect, normal respiratory effort. There is increased edema over the left above the knee amputation site. Sutures under tension. No erythema over the flaps or other signs of cellulitis, but tan pink drainage is expressed from the incisional area.The flaps appear viable and are non  tender to palpation. Will leave sutures in place.     Imaging: No results found. No images are attached to the encounter.  Labs: Lab Results  Component Value Date   HGBA1C 6.2 (H) 11/24/2018   HGBA1C 6.5 (H) 08/06/2018   HGBA1C 7.9 (A) 02/04/2018   ESRSEDRATE 21 08/06/2018   ESRSEDRATE 85 (H) 02/02/2017   CRP <0.8 10/07/2018   CRP <0.8 08/06/2018   CRP 42.4 (H) 02/02/2017   REPTSTATUS 01/02/2019 FINAL 12/31/2018   GRAMSTAIN  10/07/2018    NO WBC SEEN RARE GRAM POSITIVE COCCI IN PAIRS FEW GRAM POSITIVE RODS    CULT MULTIPLE SPECIES PRESENT, SUGGEST RECOLLECTION (A) 12/31/2018   LABORGA STAPHYLOCOCCUS AUREUS 07/08/2018     Lab Results  Component Value Date   ALBUMIN 3.2 (L) 12/31/2018   ALBUMIN 3.8 12/16/2018   ALBUMIN 2.8 (L) 11/26/2018    Body mass index is 27.46 kg/m.  Orders:  Orders Placed This Encounter  Procedures  . Wound culture   Meds ordered this encounter  Medications  . amoxicillin-clavulanate (AUGMENTIN) 875-125 MG per tablet 1 tablet     Procedures: No procedures performed  Clinical Data: No additional findings.  ROS:  All other systems negative, except as noted in the HPI. Review of Systems  Objective: Vital Signs: Ht 5\' 5"  (1.651 m)   Wt  165 lb (74.8 kg)   BMI 27.46 kg/m   Specialty Comments:  No specialty comments available.  PMFS History: Patient Active Problem List   Diagnosis Date Noted  . 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 Anemia due to chronic kidney disease   . Renal failure (ARF), acute on chronic ([place]) 11/24/2018  . Fall due to stumbling   . Osteomyelitis ([place]) 10/07/2018  . Dehiscence of amputation stump ([place])   . Moderate protein-calorie malnutrition ([place])   . Adjustment disorder with mixed anxiety and depressed mood 08/15/2018  . CKD (chronic kidney disease), stage V ([place])   . Acute on chronic anemia 07/03/2018  . Fecal incontinence 02/04/2018  . Healthcare maintenance  09/17/2017  . Right leg swelling 02/26/2017  . Superficial ulcerative lesion ([place]) 02/02/2017  . Aortic atherosclerosis ([place]) 09/30/2016  . Diabetic foot ulcer associated with diabetes mellitus due to underlying condition ([place]) 07/08/2016  . Human immunodeficiency virus (HIV) disease ([place]) 04/23/2016  . Gastroparesis   . Moderate nonproliferative diabetic retinopathy of both eyes ([place]) 11/21/2014  . Esophageal reflux   . Microscopic hematuria 10/31/2014  . Hypertension 11/16/2013  . TOBACCO USER 06/28/2006  . Depression 06/28/2006  . Diabetes mellitus type 2 in obese [facility]) 06/28/1994   Past Medical History:  Diagnosis Date  . Below-knee amputation of left lower extremity ([place])   . CKD (chronic kidney disease) stage 5, GFR less than 15 ml/min (HCC) 06/19/2016   06/30/16: Seen by Dr. Posey Pronto [Nephrology]. Suspect etiologies to include diabetes and hypertension though cannot exclude HIV-associated nephropathy. Advised her against from using excessive Goody powder.  . Contact dermatitis 04/21/2016   07/29/16: Skin biopsy performed by [facility] notable for acute spongiotic dermatitis consistent with contact dermatitis.  . Cyclical vomiting    THC induced???  . Depression 06/28/2006   Qualifier: Diagnosis of  By: Riccardo Dubin MD, Todd    . Diabetes mellitus type 2 in obese ([place]) 06/28/1994  . DKA (diabetic ketoacidoses) ([place]) 12/2014  . Erosive esophagitis   . Esophageal reflux   . Essential hypertension 11/16/2013  . Gastroparesis    ? diabetic  . GERD (gastroesophageal reflux disease)   . History of blood transfusion   . Human immunodeficiency virus (HIV) disease ([place]) 04/23/2016  . Hypertension   . Moderate nonproliferative diabetic retinopathy of both eyes ([place]) 11/21/2014   11/14/14: Noted on retinal imaging; needs follow-up imaging in 6 months  05/22/16: Noted on retinal imaging again; needs follow-up imaging in 6 months  . Normocytic anemia   . TOBACCO USER 06/28/2006    Qualifier: Diagnosis of  By: Riccardo Dubin MD, Sherren Mocha    . Type II diabetes mellitus ([place]) 1994   diagnosed around 30  . Wound dehiscence   . Wound infection s/p L transmetatarsal amputation     Family History  Problem Relation Age of Onset  . Diabetes Mother   . Diabetes Brother   . Diabetes Daughter   . Diabetes Daughter   . Mental retardation Brother        died from PNA  . Diabetes Maternal Grandmother     Past Surgical History:  Procedure Laterality Date  . AMPUTATION Left 07/08/2018   Procedure: AMPUTATION FORTH RAY LEFT FOOT;  Surgeon: [name], MD;  Location: [facility];  Service: Orthopedics;  Laterality: Left;  . AMPUTATION Left 08/09/2018   Procedure: Left Transmetatarsal Amputation;  Surgeon: [name], MD;  Location: [facility];  Service: Orthopedics;  Laterality:  Left;  . AMPUTATION Left 10/08/2018   Procedure: LEFT BELOW KNEE AMPUTATION;  Surgeon: [name], MD;  Location: [facility];  Service: Orthopedics;  Laterality: Left;  . AMPUTATION Left 10/28/2018   Procedure: REVISION BELOW KNEE AMPUTATION;  Surgeon: [name], MD;  Location: [facility];  Service: Orthopedics;  Laterality: Left;  . AMPUTATION Left 12/16/2018   Procedure: LEFT ABOVE KNEE AMPUTATION;  Surgeon: [name], MD;  Location: [facility];  Service: Orthopedics;  Laterality: Left;  . AV FISTULA PLACEMENT Left 10/14/2018   Procedure: Arteriovenous (Av) Fistula Creation Left Arm;  Surgeon: [name], MD;  Location: [facility];  Service: Vascular;  Laterality: Left;  . LOWER EXTREMITY ANGIOGRAPHY N/A 07/05/2018   Procedure: LOWER EXTREMITY ANGIOGRAPHY;  Surgeon: Serafina Mitchell, MD;  Location: [facility];  Service: Cardiovascular;  Laterality: N/A;  . PERIPHERAL VASCULAR BALLOON ANGIOPLASTY Left 07/05/2018   Procedure: PERIPHERAL VASCULAR BALLOON ANGIOPLASTY;  Surgeon: Serafina Mitchell, MD;  Location: [facility];  Service: Cardiovascular;  Laterality: Left;  SFA  . STUMP REVISION Left  10/19/2018   Procedure: REVISION LEFT BELOW KNEE AMPUTATION;  Surgeon: [name], MD;  Location: [facility];  Service: Orthopedics;  Laterality: Left;  . TUBAL LIGATION  2002   Social History   Occupational History    Employer: UNEMPLOYED  Tobacco Use  . Smoking status: Former Smoker    Packs/day: 0.10    Years: 10.00    Pack years: 1.00    Types: Cigarettes    Start date: 11/17/2013    Last attempt to quit: 09/09/2014    Years since quitting: 4.3  . Smokeless tobacco: Never Used  Substance and Sexual Activity  . Alcohol use: Yes    Comment: Occasionally.  . Drug use: Yes    Frequency: 4.0 times per week    Types: Marijuana  . Sexual activity: Not on file

## 2019-01-16 ENCOUNTER — Other Ambulatory Visit: Payer: Self-pay | Admitting: Physician Assistant

## 2019-01-16 ENCOUNTER — Telehealth: Payer: Self-pay | Admitting: Orthopedic Surgery

## 2019-01-16 DIAGNOSIS — Z89612 Acquired absence of left leg above knee: Secondary | ICD-10-CM

## 2019-01-16 MED ORDER — AMOXICILLIN-POT CLAVULANATE 875-125 MG PO TABS: 1.0000 | ORAL_TABLET | Freq: Two times a day (BID) | ORAL | 0 refills | Status: DC

## 2019-01-16 NOTE — Telephone Encounter (Signed)
Shawn please advise below, thank you

## 2019-01-16 NOTE — Telephone Encounter (Signed)
Patient was called and she has already picked up Rx

## 2019-01-16 NOTE — Telephone Encounter (Signed)
Prescription for Augmentin sent to the requested pharmacy

## 2019-01-16 NOTE — Telephone Encounter (Signed)
Pt called in said she came in to see Shawn on 01-13-2019 and was prescribed Amoxicillin-Clavulanate and Asprin-Salicylamide-Caffeine and she believes they may have been sent to the wrong place. Pt would like for the medications to be sent to [facility] on [address].  [phone]

## 2019-01-17 ENCOUNTER — Ambulatory Visit: Payer: Medicaid Other | Admitting: Orthopedic Surgery

## 2019-01-17 ENCOUNTER — Encounter: Payer: Medicaid Other | Admitting: Internal Medicine

## 2019-01-17 LAB — WOUND CULTURE
MICRO NUMBER:: 541658
SPECIMEN QUALITY:: ADEQUATE

## 2019-01-23 ENCOUNTER — Ambulatory Visit (INDEPENDENT_AMBULATORY_CARE_PROVIDER_SITE_OTHER): Payer: Medicaid Other | Admitting: Orthopedic Surgery

## 2019-01-23 ENCOUNTER — Other Ambulatory Visit: Payer: Self-pay

## 2019-01-23 ENCOUNTER — Encounter: Payer: Self-pay | Admitting: Orthopedic Surgery

## 2019-01-23 VITALS — Ht 65.0 in | Wt 165.0 lb

## 2019-01-23 DIAGNOSIS — T8781 Dehiscence of amputation stump: Secondary | ICD-10-CM

## 2019-01-23 DIAGNOSIS — Z89612 Acquired absence of left leg above knee: Secondary | ICD-10-CM

## 2019-01-23 MED ORDER — OXYCODONE-ACETAMINOPHEN 5-325 MG PO TABS: 1.0000 | ORAL_TABLET | Freq: Four times a day (QID) | ORAL | 0 refills | Status: DC | PRN

## 2019-01-23 NOTE — Progress Notes (Signed)
Office Visit Note   Patient: Kathy Frank           Date of Birth: [birth year]           MRN: [id number] Visit Date: 01/23/2019              Requested by: Lars Mage, MD [address] [place],  [place] [address] PCP: Lars Mage, MD  Chief Complaint  Patient presents with  . Left Leg - Routine Post Op    12/16/18 left AKA      HPI: Patient is a 43 year old woman who presents in follow-up for a left above-the-knee amputation.  Patient has completed 1 week of a 2-week course of Augmentin she states she still has some drainage she has decreased swelling she states the drainage is decreasing.  She requested additional pain medication.  Assessment & Plan: Visit Diagnoses:  1. Dehiscence of amputation stump ([place])   2. 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  Will reevaluate in 1 week if there is still persistent drainage would need to proceed with a revision amputation.  Again encouraged patient to follow-up to proceed with dialysis.  Follow-Up Instructions: Return in about 1 week (around 01/30/2019).   Ortho Exam  Patient is alert, oriented, no adenopathy, well-dressed, normal affect, normal respiratory effort. Examination patient has decreased swelling of her left above-knee amputation.  There is no redness no cellulitis there is clear drainage through a wound that is 5 mm in diameter but there is no purulent drainage.  Imaging: No results found. No images are attached to the encounter.  Labs: Lab Results  Component Value Date   HGBA1C 6.2 (H) 11/24/2018   HGBA1C 6.5 (H) 08/06/2018   HGBA1C 7.9 (A) 02/04/2018   ESRSEDRATE 21 08/06/2018   ESRSEDRATE 85 (H) 02/02/2017   CRP <0.8 10/07/2018   CRP <0.8 08/06/2018   CRP 42.4 (H) 02/02/2017   REPTSTATUS 01/02/2019 FINAL 12/31/2018   GRAMSTAIN  10/07/2018    NO WBC SEEN RARE GRAM POSITIVE COCCI  IN PAIRS FEW GRAM POSITIVE RODS    CULT MULTIPLE SPECIES PRESENT, SUGGEST RECOLLECTION (A) 12/31/2018   LABORGA STAPHYLOCOCCUS AUREUS 07/08/2018     Lab Results  Component Value Date   ALBUMIN 3.2 (L) 12/31/2018   ALBUMIN 3.8 12/16/2018   ALBUMIN 2.8 (L) 11/26/2018    Body mass index is 27.46 kg/m.  Orders:  No orders of the defined types were placed in this encounter.  Meds ordered this encounter  Medications  . oxyCODONE-acetaminophen (PERCOCET/ROXICET) 5-325 MG tablet    Sig: Take 1 tablet by mouth every 6 (six) hours as needed.    Dispense:  40 tablet    Refill:  0     Procedures: No procedures performed  Clinical Data: No additional findings.  ROS:  All other systems negative, except as noted in the HPI. Review of Systems  Objective: Vital Signs: Ht 5\' 5"  (1.651 m)   Wt 165 lb (74.8 kg)   BMI 27.46 kg/m   Specialty Comments:  No specialty comments available.  PMFS History: Patient Active Problem List   Diagnosis Date Noted  . 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 Anemia due to chronic kidney disease   . Renal failure (ARF), acute on chronic ([place]) 11/24/2018  . Fall due to stumbling   . Osteomyelitis (  [place]) 10/07/2018  . Dehiscence of amputation stump ([place])   . Moderate protein-calorie malnutrition ([place])   . Adjustment disorder with mixed anxiety and depressed mood 08/15/2018  . CKD (chronic kidney disease), stage V ([place])   . Acute on chronic anemia 07/03/2018  . Fecal incontinence 02/04/2018  . Healthcare maintenance 09/17/2017  . Right leg swelling 02/26/2017  . Superficial ulcerative lesion ([place]) 02/02/2017  . Aortic atherosclerosis ([place]) 09/30/2016  . Diabetic foot ulcer associated with diabetes mellitus due to underlying condition ([place]) 07/08/2016  . Human immunodeficiency virus (HIV) disease ([place]) 04/23/2016  . Gastroparesis   . Moderate nonproliferative diabetic retinopathy of both eyes ([place]) 11/21/2014   . Esophageal reflux   . Microscopic hematuria 10/31/2014  . Hypertension 11/16/2013  . TOBACCO USER 06/28/2006  . Depression 06/28/2006  . Diabetes mellitus type 2 in obese [facility]) 06/28/1994   Past Medical History:  Diagnosis Date  . Below-knee amputation of left lower extremity ([place])   . CKD (chronic kidney disease) stage 5, GFR less than 15 ml/min (HCC) 06/19/2016   06/30/16: Seen by Dr. Posey Pronto [Nephrology]. Suspect etiologies to include diabetes and hypertension though cannot exclude HIV-associated nephropathy. Advised her against from using excessive Goody powder.  . Contact dermatitis 04/21/2016   07/29/16: Skin biopsy performed by [facility] notable for acute spongiotic dermatitis consistent with contact dermatitis.  . Cyclical vomiting    THC induced???  . Depression 06/28/2006   Qualifier: Diagnosis of  By: Riccardo Dubin MD, [place]    . Diabetes mellitus type 2 in obese ([place]) 06/28/1994  . DKA (diabetic ketoacidoses) ([place]) 12/2014  . Erosive esophagitis   . Esophageal reflux   . Essential hypertension 11/16/2013  . Gastroparesis    ? diabetic  . GERD (gastroesophageal reflux disease)   . History of blood transfusion   . Human immunodeficiency virus (HIV) disease ([place]) 04/23/2016  . Hypertension   . Moderate nonproliferative diabetic retinopathy of both eyes ([place]) 11/21/2014   11/14/14: Noted on retinal imaging; needs follow-up imaging in 6 months  05/22/16: Noted on retinal imaging again; needs follow-up imaging in 6 months  . Normocytic anemia   . TOBACCO USER 06/28/2006   Qualifier: Diagnosis of  By: Riccardo Dubin MD, Sherren Mocha    . Type II diabetes mellitus ([place]) 1994   diagnosed around 89  . Wound dehiscence   . Wound infection s/p L transmetatarsal amputation     Family History  Problem Relation Age of Onset  . Diabetes Mother   . Diabetes Brother   . Diabetes Daughter   . Diabetes Daughter   . Mental retardation Brother        died from PNA  . Diabetes Maternal  Grandmother     Past Surgical History:  Procedure Laterality Date  . AMPUTATION Left 07/08/2018   Procedure: AMPUTATION FORTH RAY LEFT FOOT;  Surgeon: [name], MD;  Location: [place];  Service: Orthopedics;  Laterality: Left;  . AMPUTATION Left 08/09/2018   Procedure: Left Transmetatarsal Amputation;  Surgeon: [name], MD;  Location: [place];  Service: Orthopedics;  Laterality: Left;  . AMPUTATION Left 10/08/2018   Procedure: LEFT BELOW KNEE AMPUTATION;  Surgeon: [name], MD;  Location: [place];  Service: Orthopedics;  Laterality: Left;  . AMPUTATION Left 10/28/2018   Procedure: REVISION BELOW KNEE AMPUTATION;  Surgeon: [name], MD;  Location: [place];  Service: Orthopedics;  Laterality: Left;  . AMPUTATION Left 12/16/2018   Procedure: LEFT ABOVE KNEE AMPUTATION;  Surgeon:  [name], MD;  Location: [place];  Service: Orthopedics;  Laterality: Left;  . AV FISTULA PLACEMENT Left 10/14/2018   Procedure: Arteriovenous (Av) Fistula Creation Left Arm;  Surgeon: [name], MD;  Location: [place];  Service: Vascular;  Laterality: Left;  . LOWER EXTREMITY ANGIOGRAPHY N/A 07/05/2018   Procedure: LOWER EXTREMITY ANGIOGRAPHY;  Surgeon: Serafina Mitchell, MD;  Location: [facility];  Service: Cardiovascular;  Laterality: N/A;  . PERIPHERAL VASCULAR BALLOON ANGIOPLASTY Left 07/05/2018   Procedure: PERIPHERAL VASCULAR BALLOON ANGIOPLASTY;  Surgeon: Serafina Mitchell, MD;  Location: [place] [facility];  Service: Cardiovascular;  Laterality: Left;  SFA  . STUMP REVISION Left 10/19/2018   Procedure: REVISION LEFT BELOW KNEE AMPUTATION;  Surgeon: [name], MD;  Location: [place];  Service: Orthopedics;  Laterality: Left;  . TUBAL LIGATION  2002   Social History   Occupational History    Employer: UNEMPLOYED  Tobacco Use  . Smoking status: Former Smoker    Packs/day: 0.10    Years: 10.00    Pack years: 1.00    Types: Cigarettes    Start date: 11/17/2013    Quit date:  09/09/2014    Years since quitting: 4.3  . Smokeless tobacco: Never Used  Substance and Sexual Activity  . Alcohol use: Yes    Comment: Occasionally.  . Drug use: Yes    Frequency: 4.0 times per week    Types: Marijuana  . Sexual activity: Not on file

## 2019-01-24 MED FILL — JULUCA 50-25 MG TAB: 50-25 | 30 days supply | Qty: 30 | Fill #2

## 2019-01-25 ENCOUNTER — Encounter: Payer: Self-pay | Admitting: *Deleted

## 2019-01-27 ENCOUNTER — Other Ambulatory Visit: Payer: Self-pay | Admitting: Internal Medicine

## 2019-01-30 MED FILL — LABETALOL HCL 300 MG TABS: 300 | 30 days supply | Qty: 60 | Fill #0

## 2019-02-01 ENCOUNTER — Ambulatory Visit: Payer: Medicaid Other | Admitting: Orthopedic Surgery

## 2019-02-03 ENCOUNTER — Other Ambulatory Visit: Payer: Self-pay

## 2019-02-03 ENCOUNTER — Encounter: Payer: Self-pay | Admitting: Physician Assistant

## 2019-02-03 ENCOUNTER — Ambulatory Visit (INDEPENDENT_AMBULATORY_CARE_PROVIDER_SITE_OTHER): Payer: Medicaid Other | Admitting: Physician Assistant

## 2019-02-03 VITALS — Ht 65.0 in | Wt 165.0 lb

## 2019-02-03 DIAGNOSIS — E1142 Type 2 diabetes mellitus with diabetic polyneuropathy: Secondary | ICD-10-CM

## 2019-02-03 DIAGNOSIS — E440 Moderate protein-calorie malnutrition: Secondary | ICD-10-CM

## 2019-02-03 DIAGNOSIS — N185 Chronic kidney disease, stage 5: Secondary | ICD-10-CM

## 2019-02-03 DIAGNOSIS — D631 Anemia in chronic kidney disease: Secondary | ICD-10-CM

## 2019-02-03 DIAGNOSIS — Z89612 Acquired absence of left leg above knee: Secondary | ICD-10-CM

## 2019-02-03 DIAGNOSIS — Z794 Long term (current) use of insulin: Secondary | ICD-10-CM

## 2019-02-03 MED ORDER — OXYCODONE-ACETAMINOPHEN 5-325 MG PO TABS: 1.0000 | ORAL_TABLET | Freq: Four times a day (QID) | ORAL | 0 refills | Status: DC | PRN

## 2019-02-03 MED ORDER — AMOXICILLIN-POT CLAVULANATE 875-125 MG PO TABS: 1.0000 | ORAL_TABLET | Freq: Two times a day (BID) | ORAL | 0 refills | Status: DC

## 2019-02-03 NOTE — Discharge Instructions (Signed)
Epoetin Alfa injection °What is this medicine? °EPOETIN ALFA (e POE e tin AL fa) helps your body make more red blood cells. This medicine is used to treat anemia caused by chronic kidney disease, cancer chemotherapy, or HIV-therapy. It may also be used before surgery if you have anemia. °This medicine may be used for other purposes; ask your health care provider or pharmacist if you have questions. °COMMON BRAND NAME(S): Epogen, Procrit, Retacrit °What should I tell my health care provider before I take this medicine? °They need to know if you have any of these conditions: °-cancer °-heart disease °-high blood pressure °-history of blood clots °-history of stroke °-low levels of folate, iron, or vitamin B12 in the blood °-seizures °-an unusual or allergic reaction to erythropoietin, albumin, benzyl alcohol, hamster proteins, other medicines, foods, dyes, or preservatives °-pregnant or trying to get pregnant °-breast-feeding °How should I use this medicine? °This medicine is for injection into a vein or under the skin. It is usually given by a health care professional in a hospital or clinic setting. °If you get this medicine at home, you will be taught how to prepare and give this medicine. Use exactly as directed. Take your medicine at regular intervals. Do not take your medicine more often than directed. °It is important that you put your used needles and syringes in a special sharps container. Do not put them in a trash can. If you do not have a sharps container, call your pharmacist or healthcare provider to get one. °A special MedGuide will be given to you by the pharmacist with each prescription and refill. Be sure to read this information carefully each time. °Talk to your pediatrician regarding the use of this medicine in children. While this drug may be prescribed for selected conditions, precautions do apply. °Overdosage: If you think you have taken too much of this medicine contact a poison control center  or emergency room at once. °NOTE: This medicine is only for you. Do not share this medicine with others. °What if I miss a dose? °If you miss a dose, take it as soon as you can. If it is almost time for your next dose, take only that dose. Do not take double or extra doses. °What may interact with this medicine? °Interactions have not been studied. °This list may not describe all possible interactions. Give your health care provider a list of all the medicines, herbs, non-prescription drugs, or dietary supplements you use. Also tell them if you smoke, drink alcohol, or use illegal drugs. Some items may interact with your medicine. °What should I watch for while using this medicine? °Your condition will be monitored carefully while you are receiving this medicine. °You may need blood work done while you are taking this medicine. °This medicine may cause a decrease in vitamin B6. You should make sure that you get enough vitamin B6 while you are taking this medicine. Discuss the foods you eat and the vitamins you take with your health care professional. °What side effects may I notice from receiving this medicine? °Side effects that you should report to your doctor or health care professional as soon as possible: °-allergic reactions like skin rash, itching or hives, swelling of the face, lips, or tongue °-seizures °-signs and symptoms of a blood clot such as breathing problems; changes in vision; chest pain; severe, sudden headache; pain, swelling, warmth in the leg; trouble speaking; sudden numbness or weakness of the face, arm or leg °-signs and symptoms of a stroke   like changes in vision; confusion; trouble speaking or understanding; severe headaches; sudden numbness or weakness of the face, arm or leg; trouble walking; dizziness; loss of balance or coordination °Side effects that usually do not require medical attention (report to your doctor or health care professional if they continue or are  bothersome): °-chills °-cough °-dizziness °-fever °-headaches °-joint pain °-muscle cramps °-muscle pain °-nausea, vomiting °-pain, redness, or irritation at site where injected °This list may not describe all possible side effects. Call your doctor for medical advice about side effects. You may report side effects to FDA at 1-800-FDA-1088. °Where should I keep my medicine? °Keep out of the reach of children. °Store in a refrigerator between 2 and 8 degrees C (36 and 46 degrees F). Do not freeze or shake. Throw away any unused portion if using a single-dose vial. Multi-dose vials can be kept in the refrigerator for up to 21 days after the initial dose. Throw away unused medicine. °NOTE: This sheet is a summary. It may not cover all possible information. If you have questions about this medicine, talk to your doctor, pharmacist, or health care provider. °© 2019 Elsevier/Gold Standard (2017-03-05 08:35:19) ° °

## 2019-02-03 NOTE — Progress Notes (Signed)
Office Visit Note   Patient: Kathy Frank           Date of Birth: 04-29-1976           MRN: [id number] Visit Date: 02/03/2019              Requested by: Lars Mage, MD [address] [place],  [place] [address] PCP: Lars Mage, MD  Chief Complaint  Patient presents with  . Left Leg - Routine Post Op    12/16/2018 left AKA      HPI: The patient is a 43 year old woman who is seen for postoperative follow-up following a left above-the-knee amputation.  She is continued to have some drainage from the residual limb but reports it is overall much improved..  She has been on a course of Augmentin.  She has chronic kidney disease but is not yet on hemodialysis but she is quite anemic and reports she is going for iron shots next week.  She is having moderate pain over the area and requests refill on pain medication. She is going to see biotech to proceed with getting measured for prosthetic.  Assessment & Plan: Visit Diagnoses:  1. 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 Type 2 diabetes mellitus with diabetic polyneuropathy, with long-term current use of insulin ([place])   3. CKD (chronic kidney disease), stage V ([place])   4. Moderate protein malnutrition ([place])   5. Anemia due to stage 5 chronic kidney disease, not on chronic dialysis [facility])     Plan: Sutures were removed today.  Continue Augmentin 875 mg twice daily for the next 2 weeks.  Refilled pain medication.  Continue shrinker stocking.  Follow-up with biotech clinic for prosthetic.  Follow-Up Instructions: Return in about 2 weeks (around 02/17/2019).   Ortho Exam  Patient is alert, oriented, no adenopathy, well-dressed, normal affect, normal respiratory effort. Patient still has sutures intact with some scant serous appearing drainage from the central incision but no odor no edema of the flaps no erythema of the flaps.  She has some mild tenderness to palpation over the distal incision.  Sutures will be  removed today. Imaging: No results found.   Labs: Lab Results  Component Value Date   HGBA1C 6.2 (H) 11/24/2018   HGBA1C 6.5 (H) 08/06/2018   HGBA1C 7.9 (A) 02/04/2018   ESRSEDRATE 21 08/06/2018   ESRSEDRATE 85 (H) 02/02/2017   CRP <0.8 10/07/2018   CRP <0.8 08/06/2018   CRP 42.4 (H) 02/02/2017   REPTSTATUS 01/02/2019 FINAL 12/31/2018   GRAMSTAIN  10/07/2018    NO WBC SEEN RARE GRAM POSITIVE COCCI IN PAIRS FEW GRAM POSITIVE RODS    CULT MULTIPLE SPECIES PRESENT, SUGGEST RECOLLECTION (A) 12/31/2018   LABORGA STAPHYLOCOCCUS AUREUS 07/08/2018     Lab Results  Component Value Date   ALBUMIN 3.2 (L) 12/31/2018   ALBUMIN 3.8 12/16/2018   ALBUMIN 2.8 (L) 11/26/2018    Body mass index is 27.46 kg/m.  Orders:  No orders of the defined types were placed in this encounter.  Meds ordered this encounter  Medications  . oxyCODONE-acetaminophen (PERCOCET/ROXICET) 5-325 MG tablet    Sig: Take 1 tablet by mouth every 6 (six) hours as needed.    Dispense:  20 tablet    Refill:  0  . amoxicillin-clavulanate (AUGMENTIN) 875-125 MG tablet    Sig: Take 1 tablet by mouth 2 (two) times daily.    Dispense:  28 tablet    Refill:  0  Procedures: No procedures performed  Clinical Data: No additional findings.  ROS:  All other systems negative, except as noted in the HPI. Review of Systems  Objective: Vital Signs: Ht 5\' 5"  (1.651 m)   Wt 165 lb (74.8 kg)   BMI 27.46 kg/m   Specialty Comments:  No specialty comments available.  PMFS History: Patient Active Problem List   Diagnosis Date Noted  . 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 Anemia due to chronic kidney disease   . Renal failure (ARF), acute on chronic ([place]) 11/24/2018  . Fall due to stumbling   . Osteomyelitis ([place]) 10/07/2018  . Dehiscence of amputation stump ([place])   . Moderate protein-calorie malnutrition ([place])   . Adjustment disorder with mixed anxiety and depressed  mood 08/15/2018  . CKD (chronic kidney disease), stage V ([place])   . Acute on chronic anemia 07/03/2018  . Fecal incontinence 02/04/2018  . Healthcare maintenance 09/17/2017  . Right leg swelling 02/26/2017  . Superficial ulcerative lesion ([place]) 02/02/2017  . Aortic atherosclerosis ([place]) 09/30/2016  . Diabetic foot ulcer associated with diabetes mellitus due to underlying condition ([place]) 07/08/2016  . Human immunodeficiency virus (HIV) disease ([place]) 04/23/2016  . Gastroparesis   . Moderate nonproliferative diabetic retinopathy of both eyes ([place]) 11/21/2014  . Esophageal reflux   . Microscopic hematuria 10/31/2014  . Hypertension 11/16/2013  . TOBACCO USER 06/28/2006  . Depression 06/28/2006  . Diabetes mellitus type 2 in obese [facility]) 06/28/1994   Past Medical History:  Diagnosis Date  . Below-knee amputation of left lower extremity ([place])   . CKD (chronic kidney disease) stage 5, GFR less than 15 ml/min (HCC) 06/19/2016   06/30/16: Seen by Dr. Posey Pronto [Nephrology]. Suspect etiologies to include diabetes and hypertension though cannot exclude HIV-associated nephropathy. Advised her against from using excessive Goody powder.  . Contact dermatitis 04/21/2016   07/29/16: Skin biopsy performed by [facility] notable for acute spongiotic dermatitis consistent with contact dermatitis.  . Cyclical vomiting    THC induced???  . Depression 06/28/2006   Qualifier: Diagnosis of  By: Riccardo Dubin MD, [place]    . Diabetes mellitus type 2 in obese ([place]) 06/28/1994  . DKA (diabetic ketoacidoses) ([place]) 12/2014  . Erosive esophagitis   . Esophageal reflux   . Essential hypertension 11/16/2013  . Gastroparesis    ? diabetic  . GERD (gastroesophageal reflux disease)   . History of blood transfusion   . Human immunodeficiency virus (HIV) disease ([place]) 04/23/2016  . Hypertension   . Moderate nonproliferative diabetic retinopathy of both eyes ([place]) 11/21/2014   11/14/14: Noted on retinal imaging; needs  follow-up imaging in 6 months  05/22/16: Noted on retinal imaging again; needs follow-up imaging in 6 months  . Normocytic anemia   . TOBACCO USER 06/28/2006   Qualifier: Diagnosis of  By: Riccardo Dubin MD, Sherren Mocha    . Type II diabetes mellitus ([place]) 1994   diagnosed around 32  . Wound dehiscence   . Wound infection s/p L transmetatarsal amputation     Family History  Problem Relation Age of Onset  . Diabetes Mother   . Diabetes Brother   . Diabetes Daughter   . Diabetes Daughter   . Mental retardation Brother        died from PNA  . Diabetes Maternal Grandmother     Past Surgical History:  Procedure Laterality Date  . AMPUTATION Left 07/08/2018   Procedure: AMPUTATION FORTH RAY LEFT FOOT;  Surgeon: Meridee Score  V, MD;  Location: [facility];  Service: Orthopedics;  Laterality: Left;  . AMPUTATION Left 08/09/2018   Procedure: Left Transmetatarsal Amputation;  Surgeon: [name], MD;  Location: [place];  Service: Orthopedics;  Laterality: Left;  . AMPUTATION Left 10/08/2018   Procedure: LEFT BELOW KNEE AMPUTATION;  Surgeon: [name], MD;  Location: [place];  Service: Orthopedics;  Laterality: Left;  . AMPUTATION Left 10/28/2018   Procedure: REVISION BELOW KNEE AMPUTATION;  Surgeon: [name], MD;  Location: [place];  Service: Orthopedics;  Laterality: Left;  . AMPUTATION Left 12/16/2018   Procedure: LEFT ABOVE KNEE AMPUTATION;  Surgeon: [name], MD;  Location: [facility];  Service: Orthopedics;  Laterality: Left;  . AV FISTULA PLACEMENT Left 10/14/2018   Procedure: Arteriovenous (Av) Fistula Creation Left Arm;  Surgeon: [name], MD;  Location: [place];  Service: Vascular;  Laterality: Left;  . LOWER EXTREMITY ANGIOGRAPHY N/A 07/05/2018   Procedure: LOWER EXTREMITY ANGIOGRAPHY;  Surgeon: Serafina Mitchell, MD;  Location: [facility];  Service: Cardiovascular;  Laterality: N/A;  . PERIPHERAL VASCULAR BALLOON ANGIOPLASTY Left 07/05/2018   Procedure: PERIPHERAL VASCULAR  BALLOON ANGIOPLASTY;  Surgeon: Serafina Mitchell, MD;  Location: [facility] [facility];  Service: Cardiovascular;  Laterality: Left;  SFA  . STUMP REVISION Left 10/19/2018   Procedure: REVISION LEFT BELOW KNEE AMPUTATION;  Surgeon: [name], MD;  Location: [place];  Service: Orthopedics;  Laterality: Left;  . TUBAL LIGATION  2002   Social History   Occupational History    Employer: UNEMPLOYED  Tobacco Use  . Smoking status: Former Smoker    Packs/day: 0.10    Years: 10.00    Pack years: 1.00    Types: Cigarettes    Start date: 11/17/2013    Quit date: 09/09/2014    Years since quitting: 4.4  . Smokeless tobacco: Never Used  Substance and Sexual Activity  . Alcohol use: Yes    Comment: Occasionally.  . Drug use: Yes    Frequency: 4.0 times per week    Types: Marijuana  . Sexual activity: Not on file

## 2019-02-06 ENCOUNTER — Inpatient Hospital Stay (HOSPITAL_COMMUNITY)
Admission: EM | Admit: 2019-02-06 | Discharge: 2019-02-09 | DRG: 074 | Disposition: A | Discharge status: Home / Self Care | Payer: Medicaid Other | Attending: Internal Medicine | Admitting: Internal Medicine

## 2019-02-06 ENCOUNTER — Other Ambulatory Visit: Payer: Self-pay

## 2019-02-06 ENCOUNTER — Ambulatory Visit: Payer: Medicaid Other | Admitting: Internal Medicine

## 2019-02-06 ENCOUNTER — Ambulatory Visit (HOSPITAL_COMMUNITY)
Admission: RE | Admit: 2019-02-06 | Discharge: 2019-02-06 | Disposition: A | Discharge status: Home / Self Care | Payer: Medicaid Other | Source: Ambulatory Visit | Attending: Nephrology | Admitting: Nephrology

## 2019-02-06 DIAGNOSIS — R1084 Generalized abdominal pain: Secondary | ICD-10-CM

## 2019-02-06 DIAGNOSIS — K219 Gastro-esophageal reflux disease without esophagitis: Secondary | ICD-10-CM | POA: Diagnosis present

## 2019-02-06 DIAGNOSIS — D62 Acute posthemorrhagic anemia: Secondary | ICD-10-CM | POA: Diagnosis not present

## 2019-02-06 DIAGNOSIS — Z87891 Personal history of nicotine dependence: Secondary | ICD-10-CM

## 2019-02-06 DIAGNOSIS — Z833 Family history of diabetes mellitus: Secondary | ICD-10-CM

## 2019-02-06 DIAGNOSIS — N186 End stage renal disease: Secondary | ICD-10-CM | POA: Diagnosis present

## 2019-02-06 DIAGNOSIS — Z810 Family history of intellectual disabilities: Secondary | ICD-10-CM

## 2019-02-06 DIAGNOSIS — E669 Obesity, unspecified: Secondary | ICD-10-CM | POA: Diagnosis present

## 2019-02-06 DIAGNOSIS — N920 Excessive and frequent menstruation with regular cycle: Secondary | ICD-10-CM | POA: Diagnosis present

## 2019-02-06 DIAGNOSIS — B20 Human immunodeficiency virus [HIV] disease: Secondary | ICD-10-CM

## 2019-02-06 DIAGNOSIS — E1122 Type 2 diabetes mellitus with diabetic chronic kidney disease: Secondary | ICD-10-CM

## 2019-02-06 DIAGNOSIS — Z20828 Contact with and (suspected) exposure to other viral communicable diseases: Secondary | ICD-10-CM | POA: Diagnosis present

## 2019-02-06 DIAGNOSIS — N185 Chronic kidney disease, stage 5: Secondary | ICD-10-CM

## 2019-02-06 DIAGNOSIS — K3184 Gastroparesis: Secondary | ICD-10-CM

## 2019-02-06 DIAGNOSIS — F329 Major depressive disorder, single episode, unspecified: Secondary | ICD-10-CM | POA: Diagnosis present

## 2019-02-06 DIAGNOSIS — Z992 Dependence on renal dialysis: Secondary | ICD-10-CM | POA: Diagnosis present

## 2019-02-06 DIAGNOSIS — D649 Anemia, unspecified: Secondary | ICD-10-CM | POA: Diagnosis present

## 2019-02-06 DIAGNOSIS — Z89612 Acquired absence of left leg above knee: Secondary | ICD-10-CM

## 2019-02-06 DIAGNOSIS — E1143 Type 2 diabetes mellitus with diabetic autonomic (poly)neuropathy: Secondary | ICD-10-CM | POA: Diagnosis not present

## 2019-02-06 DIAGNOSIS — I120 Hypertensive chronic kidney disease with stage 5 chronic kidney disease or end stage renal disease: Secondary | ICD-10-CM

## 2019-02-06 DIAGNOSIS — E113393 Type 2 diabetes mellitus with moderate nonproliferative diabetic retinopathy without macular edema, bilateral: Secondary | ICD-10-CM | POA: Diagnosis present

## 2019-02-06 DIAGNOSIS — Z79899 Other long term (current) drug therapy: Secondary | ICD-10-CM

## 2019-02-06 DIAGNOSIS — R109 Unspecified abdominal pain: Secondary | ICD-10-CM | POA: Diagnosis present

## 2019-02-06 DIAGNOSIS — E1169 Type 2 diabetes mellitus with other specified complication: Secondary | ICD-10-CM | POA: Diagnosis present

## 2019-02-06 DIAGNOSIS — D500 Iron deficiency anemia secondary to blood loss (chronic): Secondary | ICD-10-CM | POA: Diagnosis present

## 2019-02-06 DIAGNOSIS — R011 Cardiac murmur, unspecified: Secondary | ICD-10-CM

## 2019-02-06 DIAGNOSIS — E872 Acidosis: Secondary | ICD-10-CM | POA: Diagnosis present

## 2019-02-06 DIAGNOSIS — Z8719 Personal history of other diseases of the digestive system: Secondary | ICD-10-CM

## 2019-02-06 DIAGNOSIS — G8929 Other chronic pain: Secondary | ICD-10-CM | POA: Diagnosis present

## 2019-02-06 DIAGNOSIS — Z79891 Long term (current) use of opiate analgesic: Secondary | ICD-10-CM

## 2019-02-06 DIAGNOSIS — E119 Type 2 diabetes mellitus without complications: Secondary | ICD-10-CM | POA: Diagnosis present

## 2019-02-06 DIAGNOSIS — D631 Anemia in chronic kidney disease: Secondary | ICD-10-CM

## 2019-02-06 DIAGNOSIS — I770 Arteriovenous fistula, acquired: Secondary | ICD-10-CM

## 2019-02-06 DIAGNOSIS — Z9889 Other specified postprocedural states: Secondary | ICD-10-CM

## 2019-02-06 DIAGNOSIS — Z794 Long term (current) use of insulin: Secondary | ICD-10-CM

## 2019-02-06 HISTORY — DX: Unspecified abdominal pain: R10.9

## 2019-02-06 LAB — LIPASE, BLOOD: Lipase: 28 U/L (ref 11–51)

## 2019-02-06 LAB — PROTIME-INR
INR: 1.2 (ref 0.8–1.2)
Prothrombin Time: 14.9 seconds (ref 11.4–15.2)

## 2019-02-06 LAB — CBC WITH DIFFERENTIAL/PLATELET
Abs Immature Granulocytes: 0.02 10*3/uL (ref 0.00–0.07)
Basophils Absolute: 0.1 10*3/uL (ref 0.0–0.1)
Basophils Relative: 1 %
Eosinophils Absolute: 0.1 10*3/uL (ref 0.0–0.5)
Eosinophils Relative: 2 %
HCT: 21.8 % — ABNORMAL LOW (ref 36.0–46.0)
Hemoglobin: 6.7 g/dL — CL (ref 12.0–15.0)
Immature Granulocytes: 0 %
Lymphocytes Relative: 17 %
Lymphs Abs: 1.2 10*3/uL (ref 0.7–4.0)
MCH: 28.3 pg (ref 26.0–34.0)
MCHC: 30.7 g/dL (ref 30.0–36.0)
MCV: 92 fL (ref 80.0–100.0)
Monocytes Absolute: 0.4 10*3/uL (ref 0.1–1.0)
Monocytes Relative: 5 %
Neutro Abs: 5.5 10*3/uL (ref 1.7–7.7)
Neutrophils Relative %: 75 %
Platelets: 278 10*3/uL (ref 150–400)
RBC: 2.37 MIL/uL — ABNORMAL LOW (ref 3.87–5.11)
RDW: 18.8 % — ABNORMAL HIGH (ref 11.5–15.5)
WBC: 7.3 10*3/uL (ref 4.0–10.5)
nRBC: 0 % (ref 0.0–0.2)

## 2019-02-06 LAB — URINALYSIS, ROUTINE W REFLEX MICROSCOPIC
Bilirubin Urine: NEGATIVE
Glucose, UA: 50 mg/dL — AB
Ketones, ur: NEGATIVE mg/dL
Leukocytes,Ua: NEGATIVE
Nitrite: NEGATIVE
Protein, ur: >=300 mg/dL — AB
Specific Gravity, Urine: 1.016 (ref 1.005–1.030)
pH: 5 (ref 5.0–8.0)

## 2019-02-06 LAB — COMPREHENSIVE METABOLIC PANEL
ALT: 9 U/L (ref 0–44)
AST: 12 U/L — ABNORMAL LOW (ref 15–41)
Albumin: 3 g/dL — ABNORMAL LOW (ref 3.5–5.0)
Alkaline Phosphatase: 111 U/L (ref 38–126)
Anion gap: 12 (ref 5–15)
BUN: 38 mg/dL — ABNORMAL HIGH (ref 6–20)
CO2: 16 mmol/L — ABNORMAL LOW (ref 22–32)
Calcium: 7.6 mg/dL — ABNORMAL LOW (ref 8.9–10.3)
Chloride: 111 mmol/L (ref 98–111)
Creatinine, Ser: 6.95 mg/dL — ABNORMAL HIGH (ref 0.44–1.00)
GFR calc Af Amer: 8 mL/min — ABNORMAL LOW (ref >60)
GFR calc non Af Amer: 7 mL/min — ABNORMAL LOW (ref >60)
Glucose, Bld: 194 mg/dL — ABNORMAL HIGH (ref 70–99)
Potassium: 3.7 mmol/L (ref 3.5–5.1)
Sodium: 139 mmol/L (ref 135–145)
Total Bilirubin: 0.4 mg/dL (ref 0.3–1.2)
Total Protein: 7.2 g/dL (ref 6.5–8.1)

## 2019-02-06 LAB — GLUCOSE, CAPILLARY: Glucose-Capillary: 127 mg/dL — ABNORMAL HIGH (ref 70–99)

## 2019-02-06 LAB — SARS CORONAVIRUS 2 BY RT PCR (HOSPITAL ORDER, PERFORMED IN ~~LOC~~ HOSPITAL LAB): SARS Coronavirus 2: NEGATIVE

## 2019-02-06 LAB — PREPARE RBC (CROSSMATCH)

## 2019-02-06 LAB — APTT: aPTT: 35 seconds (ref 24–36)

## 2019-02-06 MED ORDER — ACETAMINOPHEN 325 MG PO TABS: 650.0000 mg | ORAL_TABLET | Freq: Four times a day (QID) | ORAL | Status: DC | PRN

## 2019-02-06 MED ORDER — ONDANSETRON HCL 4 MG/2ML IJ SOLN
4.0000 mg | Freq: Once | INTRAMUSCULAR | Status: AC
  Administered 2019-02-06: 4 mg via INTRAVENOUS
  Filled 2019-02-06: qty 2

## 2019-02-06 MED ORDER — HYDROMORPHONE HCL 1 MG/ML IJ SOLN
0.5000 mg | INTRAMUSCULAR | Status: DC | PRN
  Administered 2019-02-07 (×2): 0.5 mg via INTRAVENOUS
  Filled 2019-02-06 (×2): qty 0.5

## 2019-02-06 MED ORDER — METOCLOPRAMIDE HCL 10 MG PO TABS
5.0000 mg | ORAL_TABLET | Freq: Two times a day (BID) | ORAL | Status: DC
  Administered 2019-02-07 – 2019-02-09 (×6): 5 mg via ORAL
  Filled 2019-02-06 (×6): qty 1

## 2019-02-06 MED ORDER — ACETAMINOPHEN 650 MG RE SUPP: 650.0000 mg | Freq: Four times a day (QID) | RECTAL | Status: DC | PRN

## 2019-02-06 MED ORDER — INSULIN ASPART 100 UNIT/ML ~~LOC~~ SOLN: 0.0000 [IU] | Freq: Every day | SUBCUTANEOUS | Status: DC

## 2019-02-06 MED ORDER — CALCITRIOL 0.25 MCG PO CAPS
0.2500 ug | ORAL_CAPSULE | Freq: Every day | ORAL | Status: DC
  Administered 2019-02-07 – 2019-02-09 (×3): 0.25 ug via ORAL
  Filled 2019-02-06 (×3): qty 1

## 2019-02-06 MED ORDER — HYDROMORPHONE HCL 1 MG/ML IJ SOLN
0.5000 mg | Freq: Once | INTRAMUSCULAR | Status: AC
  Administered 2019-02-06: 0.5 mg via INTRAVENOUS
  Filled 2019-02-06: qty 1

## 2019-02-06 MED ORDER — CALCIUM ACETATE (PHOS BINDER) 667 MG PO CAPS
1334.0000 mg | ORAL_CAPSULE | Freq: Three times a day (TID) | ORAL | Status: DC
  Administered 2019-02-07 – 2019-02-09 (×7): 1334 mg via ORAL
  Filled 2019-02-06 (×7): qty 2

## 2019-02-06 MED ORDER — DOLUTEGRAVIR-RILPIVIRINE 50-25 MG PO TABS: 1.0000 | ORAL_TABLET | Freq: Every day | ORAL | Status: DC

## 2019-02-06 MED ORDER — HYDROMORPHONE HCL 1 MG/ML IJ SOLN
1.0000 mg | Freq: Once | INTRAMUSCULAR | Status: AC
  Administered 2019-02-06: 1 mg via INTRAVENOUS
  Filled 2019-02-06: qty 1

## 2019-02-06 MED ORDER — FUROSEMIDE 20 MG PO TABS
40.0000 mg | ORAL_TABLET | Freq: Two times a day (BID) | ORAL | Status: DC
  Administered 2019-02-07 – 2019-02-09 (×5): 40 mg via ORAL
  Filled 2019-02-06 (×5): qty 2

## 2019-02-06 MED ORDER — LABETALOL HCL 200 MG PO TABS
300.0000 mg | ORAL_TABLET | Freq: Two times a day (BID) | ORAL | Status: DC
  Administered 2019-02-06 – 2019-02-09 (×6): 300 mg via ORAL
  Filled 2019-02-06 (×6): qty 2

## 2019-02-06 MED ORDER — SODIUM CHLORIDE 0.9 % IV BOLUS
1000.0000 mL | Freq: Once | INTRAVENOUS | Status: AC
  Administered 2019-02-06: 1000 mL via INTRAVENOUS

## 2019-02-06 MED ORDER — CITALOPRAM HYDROBROMIDE 20 MG PO TABS
20.0000 mg | ORAL_TABLET | Freq: Every day | ORAL | Status: DC
  Administered 2019-02-07 – 2019-02-09 (×3): 20 mg via ORAL
  Filled 2019-02-06: qty 1
  Filled 2019-02-06 (×2): qty 2
  Filled 2019-02-06: qty 1

## 2019-02-06 MED ORDER — SODIUM CHLORIDE 0.9% IV SOLUTION: Freq: Once | INTRAVENOUS | Status: DC

## 2019-02-06 MED ORDER — AMOXICILLIN-POT CLAVULANATE 500-125 MG PO TABS
1.0000 | ORAL_TABLET | Freq: Every day | ORAL | Status: DC
  Administered 2019-02-07 – 2019-02-09 (×3): 500 mg via ORAL
  Filled 2019-02-06 (×3): qty 1

## 2019-02-06 MED ORDER — INSULIN ASPART 100 UNIT/ML ~~LOC~~ SOLN
2.0000 [IU] | Freq: Three times a day (TID) | SUBCUTANEOUS | Status: DC
  Administered 2019-02-07 – 2019-02-09 (×5): 2 [IU] via SUBCUTANEOUS

## 2019-02-06 MED ORDER — AMOXICILLIN-POT CLAVULANATE 500-125 MG PO TABS
1.0000 | ORAL_TABLET | ORAL | Status: DC
  Filled 2019-02-06: qty 1

## 2019-02-06 MED ORDER — HEPARIN SODIUM (PORCINE) 5000 UNIT/ML IJ SOLN
5000.0000 [IU] | Freq: Three times a day (TID) | INTRAMUSCULAR | Status: DC
  Administered 2019-02-07 – 2019-02-09 (×8): 5000 [IU] via SUBCUTANEOUS
  Filled 2019-02-06 (×8): qty 1

## 2019-02-06 MED ORDER — METOCLOPRAMIDE HCL 5 MG/ML IJ SOLN
10.0000 mg | Freq: Once | INTRAMUSCULAR | Status: AC
  Administered 2019-02-06: 10 mg via INTRAVENOUS
  Filled 2019-02-06: qty 2

## 2019-02-06 MED ORDER — AMLODIPINE BESYLATE 5 MG PO TABS
10.0000 mg | ORAL_TABLET | Freq: Every day | ORAL | Status: DC
  Administered 2019-02-07 – 2019-02-09 (×3): 10 mg via ORAL
  Filled 2019-02-06 (×3): qty 2

## 2019-02-06 MED ORDER — AMOXICILLIN-POT CLAVULANATE 875-125 MG PO TABS: 1.0000 | ORAL_TABLET | Freq: Two times a day (BID) | ORAL | Status: DC

## 2019-02-06 MED ORDER — HYDROMORPHONE HCL 2 MG PO TABS
2.0000 mg | ORAL_TABLET | ORAL | Status: DC | PRN
  Administered 2019-02-06: 2 mg via ORAL
  Filled 2019-02-06: qty 1

## 2019-02-06 MED ORDER — ONDANSETRON HCL 4 MG/2ML IJ SOLN
4.0000 mg | Freq: Once | INTRAMUSCULAR | Status: AC
  Administered 2019-02-06: 19:00:00 4 mg via INTRAVENOUS
  Filled 2019-02-06: qty 2

## 2019-02-06 MED ORDER — INSULIN ASPART 100 UNIT/ML ~~LOC~~ SOLN
0.0000 [IU] | Freq: Three times a day (TID) | SUBCUTANEOUS | Status: DC
  Administered 2019-02-07: 3 [IU] via SUBCUTANEOUS
  Administered 2019-02-08: 2 [IU] via SUBCUTANEOUS
  Administered 2019-02-08: 1 [IU] via SUBCUTANEOUS

## 2019-02-06 NOTE — ED Notes (Signed)
Got patient undress on the monitor patient is resting with call bell in reach 

## 2019-02-06 NOTE — Progress Notes (Signed)
Pt arrived for first time Procrit injection complaining of 2 days of nausea and vomiting with abdominal pain. No fever. VSS. Patient taken to ED via wheelchair accompanied by Dorothea Glassman, RN. No injection given today. Pt to return on Monday(02-13-19) if feeling well.

## 2019-02-06 NOTE — ED Notes (Signed)
Ginger ale given

## 2019-02-06 NOTE — ED Triage Notes (Signed)
Pt her from short stay for shot for anemia. Said having abdominal pain and could not keep anything down.

## 2019-02-06 NOTE — Progress Notes (Signed)
PHARMACY NOTE:  ANTIMICROBIAL RENAL DOSAGE ADJUSTMENT  Current antimicrobial regimen includes a mismatch between antimicrobial dosage and estimated renal function.  As per policy approved by the Pharmacy & Therapeutics and Medical Executive Committees, the antimicrobial dosage will be adjusted accordingly.  Current antimicrobial dosage:  875mg  PO Q12h   Renal Function:  Estimated Creatinine Clearance: 10.7 mL/min (A) (by C-G formula based on SCr of 6.95 mg/dL (H)). []      On intermittent HD, scheduled: []      On CRRT    Antimicrobial dosage has been changed to:  Augmentin 500mg  PO q24h  Additional comments:   Jett Fukuda A. Levada Dy, PharmD, [place] Please utilize Amion for appropriate phone number to reach the unit pharmacist ([place])   02/06/2019 9:02 PM

## 2019-02-06 NOTE — ED Provider Notes (Addendum)
[facility] EMERGENCY DEPARTMENT Provider Note   CSN: [id number] Arrival date & time: 02/06/19  1108    History   Chief Complaint Chief Complaint  Patient presents with  . Abdominal Pain    HPI Kathy Frank is a 43 y.o. female.     Level 5 caveat for acuity of condition.  Complex patient with multiple health problems including HIV, CKD, diabetes, gastroparesis, anemia, many others presents with left lower abdominal pain this morning while in short stay getting an injection for anemia.  Review of systems positive for vomiting.  No fever, sweats, chills, cough, prodromal illnesses.  Severity is moderate.  Nothing makes symptoms better or worse.     Past Medical History:  Diagnosis Date  . Below-knee amputation of left lower extremity ([place])   . CKD (chronic kidney disease) stage 5, GFR less than 15 ml/min (HCC) 06/19/2016   06/30/16: Seen by Dr. Posey Pronto [Nephrology]. Suspect etiologies to include diabetes and hypertension though cannot exclude HIV-associated nephropathy. Advised her against from using excessive Goody powder.  . Contact dermatitis 04/21/2016   07/29/16: Skin biopsy performed by [facility] notable for acute spongiotic dermatitis consistent with contact dermatitis.  . Cyclical vomiting    THC induced???  . Depression 06/28/2006   Qualifier: Diagnosis of  By: Riccardo Dubin MD, [place]    . Diabetes mellitus type 2 in obese ([place]) 06/28/1994  . DKA (diabetic ketoacidoses) ([place]) 12/2014  . Erosive esophagitis   . Esophageal reflux   . Essential hypertension 11/16/2013  . Gastroparesis    ? diabetic  . GERD (gastroesophageal reflux disease)   . History of blood transfusion   . Human immunodeficiency virus (HIV) disease ([place]) 04/23/2016  . Hypertension   . Moderate nonproliferative diabetic retinopathy of both eyes ([place]) 11/21/2014   11/14/14: Noted on retinal imaging; needs follow-up imaging in 6 months  05/22/16: Noted on retinal imaging again;  needs follow-up imaging in 6 months  . Normocytic anemia   . TOBACCO USER 06/28/2006   Qualifier: Diagnosis of  By: Riccardo Dubin MD, Sherren Mocha    . Type II diabetes mellitus ([place]) 1994   diagnosed around 71  . Wound dehiscence   . Wound infection s/p L transmetatarsal amputation     Patient Active Problem List   Diagnosis Date Noted  . Abdominal pain 02/06/2019  . Acquired absence of left lower extremity above knee ([place]) 12/16/2018  . Anemia due to chronic kidney disease   . Renal failure (ARF), acute on chronic ([place]) 11/24/2018  . Fall due to stumbling   . Osteomyelitis ([place]) 10/07/2018  . Dehiscence of amputation stump ([place])   . Moderate protein-calorie malnutrition ([place])   . Adjustment disorder with mixed anxiety and depressed mood 08/15/2018  . CKD (chronic kidney disease), stage V ([place])   . Acute on chronic anemia 07/03/2018  . Fecal incontinence 02/04/2018  . Healthcare maintenance 09/17/2017  . Right leg swelling 02/26/2017  . Superficial ulcerative lesion ([place]) 02/02/2017  . Aortic atherosclerosis ([place]) 09/30/2016  . Diabetic foot ulcer associated with diabetes mellitus due to underlying condition ([place]) 07/08/2016  . Human immunodeficiency virus (HIV) disease ([place]) 04/23/2016  . Gastroparesis   . Moderate nonproliferative diabetic retinopathy of both eyes ([place]) 11/21/2014  . Esophageal reflux   . Microscopic hematuria 10/31/2014  . Hypertension 11/16/2013  . TOBACCO USER 06/28/2006  . Depression 06/28/2006  . Diabetes mellitus type 2 in obese [facility]) 06/28/1994    Past Surgical History:  Procedure Laterality Date  .  AMPUTATION Left 07/08/2018   Procedure: AMPUTATION FORTH RAY LEFT FOOT;  Surgeon: [name], MD;  Location: [facility];  Service: Orthopedics;  Laterality: Left;  . AMPUTATION Left 08/09/2018   Procedure: Left Transmetatarsal Amputation;  Surgeon: [name], MD;  Location: [place];  Service: Orthopedics;  Laterality: Left;  . AMPUTATION Left 10/08/2018    Procedure: LEFT BELOW KNEE AMPUTATION;  Surgeon: [name], MD;  Location: [place];  Service: Orthopedics;  Laterality: Left;  . AMPUTATION Left 10/28/2018   Procedure: REVISION BELOW KNEE AMPUTATION;  Surgeon: [name], MD;  Location: [place];  Service: Orthopedics;  Laterality: Left;  . AMPUTATION Left 12/16/2018   Procedure: LEFT ABOVE KNEE AMPUTATION;  Surgeon: [name], MD;  Location: [place];  Service: Orthopedics;  Laterality: Left;  . AV FISTULA PLACEMENT Left 10/14/2018   Procedure: Arteriovenous (Av) Fistula Creation Left Arm;  Surgeon: [name], MD;  Location: [place];  Service: Vascular;  Laterality: Left;  . LOWER EXTREMITY ANGIOGRAPHY N/A 07/05/2018   Procedure: LOWER EXTREMITY ANGIOGRAPHY;  Surgeon: Serafina Mitchell, MD;  Location: [place] [facility];  Service: Cardiovascular;  Laterality: N/A;  . PERIPHERAL VASCULAR BALLOON ANGIOPLASTY Left 07/05/2018   Procedure: PERIPHERAL VASCULAR BALLOON ANGIOPLASTY;  Surgeon: Serafina Mitchell, MD;  Location: [place] [facility];  Service: Cardiovascular;  Laterality: Left;  SFA  . STUMP REVISION Left 10/19/2018   Procedure: REVISION LEFT BELOW KNEE AMPUTATION;  Surgeon: [name], MD;  Location: [place];  Service: Orthopedics;  Laterality: Left;  . TUBAL LIGATION  2002     OB History   No obstetric history on file.      Home Medications    Prior to Admission medications   Medication Sig Start Date End Date Taking? Authorizing Provider  amLODipine (NORVASC) 10 MG tablet TAKE 1 TABLET BY MOUTH DAILY. 12/26/18   Lars Mage, MD  amoxicillin-clavulanate (AUGMENTIN) 875-125 MG tablet Take 1 tablet by mouth 2 (two) times daily. 02/03/19   Rayburn, Neta Mends, PA-C  Aspirin-Salicylamide-Caffeine (BC HEADACHE POWDER PO) Take 1 packet by mouth as needed (for headaches).    [provider]  calcitRIOL (ROCALTROL) 0.25 MCG capsule Take 1 capsule (0.25 mcg total) by mouth daily. 10/17/18   [name], MD   calcium acetate (PHOSLO) 667 MG capsule Take 2 capsules (1,334 mg total) by mouth 3 (three) times daily with meals. 10/16/18   [name], MD  citalopram (CELEXA) 20 MG tablet Take 1 tablet (20 mg total) by mouth daily. 08/12/18 10/27/27  Carroll Sage, MD  furosemide (LASIX) 40 MG tablet TAKE 1 TABLET (40 MG TOTAL) BY MOUTH 2 (TWO) TIMES DAILY. 12/20/18   Chundi, Verne Spurr, MD  gabapentin (NEURONTIN) 300 MG capsule Take 1 capsule (300 mg total) by mouth 2 (two) times daily. 11/17/18   Rayburn, Neta Mends, PA-C  glucose blood (ACCU-CHEK GUIDE) test strip USE TO TEST BLOOD SUGAR 3 TIMES DAILY . Diagnosis code  E11.69, E66.9 07/09/17   Lars Mage, MD  Insulin Glargine (LANTUS) 100 UNIT/ML Solostar Pen Inject 5 Units into the skin at bedtime. 10/16/18   [name], MD  JULUCA 50-25 MG TABS TAKE 1 TABLET BY MOUTH DAILY. PLEASE TAKE WITH LARGEST MEAL OF THE DAY. Patient taking differently: Take 1 tablet by mouth daily with breakfast.  11/11/18 02/09/19  Chundi, Verne Spurr, MD  labetalol (NORMODYNE) 300 MG tablet TAKE 1 TABLET (300 MG TOTAL) BY MOUTH 2 (TWO) TIMES DAILY. 01/28/19   Chundi,  Vahini, MD  liraglutide (VICTOZA) 18 MG/3ML SOPN Inject 0.2 mLs (1.2 mg total) into the skin daily. Patient taking differently: Inject 1.2 mg into the skin at bedtime.  02/04/18   Chundi, Verne Spurr, MD  megestrol (MEGACE) 40 MG tablet Take 1 tablet (40 mg total) by mouth daily. 10/21/18   [name], MD  methocarbamol (ROBAXIN) 500 MG tablet Take 1 tablet (500 mg total) by mouth every 6 (six) hours as needed for muscle spasms. 10/21/18   [name], MD  metoCLOPramide (REGLAN) 5 MG tablet Take 1 tablet (5 mg total) by mouth 2 (two) times daily with breakfast and lunch. 12/31/18   Darlin Drop P, PA-C  nutrition supplement, JUVEN, (JUVEN) PACK Take 1 packet by mouth 2 (two) times daily between meals. 10/16/18   [name], MD  ondansetron (ZOFRAN) 4 MG tablet Take 1 tablet (4 mg total) by mouth  daily as needed for nausea or vomiting. 08/19/18 08/19/19  Alphonzo Grieve, MD  oxyCODONE-acetaminophen (PERCOCET/ROXICET) 5-325 MG tablet Take 1 tablet by mouth every 6 (six) hours as needed. 02/03/19   Rayburn, Neta Mends, PA-C  polyethylene glycol (MIRALAX / GLYCOLAX) packet Take 17 g by mouth daily. Patient taking differently: Take 17 g by mouth daily as needed for mild constipation.  08/12/18   Carroll Sage, MD  senna (SENOKOT) 8.6 MG TABS tablet Take 2 tablets (17.2 mg total) by mouth 2 (two) times daily. 08/12/18   Carroll Sage, MD  sodium bicarbonate 650 MG tablet Take 2 tablets (1,300 mg total) by mouth 2 (two) times daily. Patient taking differently: Take 650 mg by mouth 2 (two) times daily.  10/16/18   [name], MD    Family History Family History  Problem Relation Age of Onset  . Diabetes Mother   . Diabetes Brother   . Diabetes Daughter   . Diabetes Daughter   . Mental retardation Brother        died from PNA  . Diabetes Maternal Grandmother     Social History Social History   Tobacco Use  . Smoking status: Former Smoker    Packs/day: 0.10    Years: 10.00    Pack years: 1.00    Types: Cigarettes    Start date: 11/17/2013    Quit date: 09/09/2014    Years since quitting: 4.4  . Smokeless tobacco: Never Used  Substance Use Topics  . Alcohol use: Yes    Comment: Occasionally.  . Drug use: Yes    Frequency: 4.0 times per week    Types: Marijuana     Allergies   Patient has no known allergies.   Review of Systems Review of Systems  Unable to perform ROS: Acuity of condition  All other systems reviewed and are negative.    Physical Exam Updated Vital Signs BP (!) 170/98 (BP Location: Right Arm)   Pulse 86   Temp 98.2 F (36.8 C) (Oral)   Resp 20   SpO2 100%   Physical Exam Vitals signs and nursing note reviewed.  Constitutional:      Comments: In pain  HENT:     Head: Normocephalic and atraumatic.  Eyes:     Conjunctiva/sclera:  Conjunctivae normal.  Neck:     Musculoskeletal: Neck supple.  Cardiovascular:     Rate and Rhythm: Normal rate and regular rhythm.  Pulmonary:     Effort: Pulmonary effort is normal.     Breath sounds: Normal breath sounds.  Abdominal:     General: Bowel sounds  are normal.     Palpations: Abdomen is soft.     Comments: Tender LLQ  Musculoskeletal: Normal range of motion.  Skin:    General: Skin is warm and dry.  Neurological:     Mental Status: She is alert and oriented to person, place, and time.  Psychiatric:        Behavior: Behavior normal.      ED Treatments / Results  Labs (all labs ordered are listed, but only abnormal results are displayed) Labs Reviewed  CBC WITH DIFFERENTIAL/PLATELET - Abnormal; Notable for the following components:      Result Value   RBC 2.37 (*)    Hemoglobin 6.7 (*)    HCT 21.8 (*)    RDW 18.8 (*)    All other components within normal limits  COMPREHENSIVE METABOLIC PANEL - Abnormal; Notable for the following components:   CO2 16 (*)    Glucose, Bld 194 (*)    BUN 38 (*)    Creatinine, Ser 6.95 (*)    Calcium 7.6 (*)    Albumin 3.0 (*)    AST 12 (*)    GFR calc non Af Amer 7 (*)    GFR calc Af Amer 8 (*)    All other components within normal limits  URINALYSIS, ROUTINE W REFLEX MICROSCOPIC - Abnormal; Notable for the following components:   APPearance CLOUDY (*)    Glucose, UA 50 (*)    Hgb urine dipstick SMALL (*)    Protein, ur >=300 (*)    Bacteria, UA RARE (*)    All other components within normal limits  LIPASE, BLOOD    EKG None  Radiology No results found.  Procedures Procedures (including critical care time)  Medications Ordered in ED Medications  sodium chloride 0.9 % bolus 1,000 mL (has no administration in time range)  ondansetron (ZOFRAN) injection 4 mg (has no administration in time range)  HYDROmorphone (DILAUDID) injection 0.5 mg (has no administration in time range)  ondansetron (ZOFRAN) injection 4  mg (4 mg Intravenous Given 02/06/19 1208)  sodium chloride 0.9 % bolus 1,000 mL (1,000 mLs Intravenous New Bag/Given 02/06/19 1210)  metoCLOPramide (REGLAN) injection 10 mg (10 mg Intravenous Given 02/06/19 1208)  HYDROmorphone (DILAUDID) injection 1 mg (1 mg Intravenous Given 02/06/19 1208)     Initial Impression / Assessment and Plan / ED Course  I have reviewed the triage vital signs and the nursing notes.  Pertinent labs & imaging results that were available during my care of the patient were reviewed by me and considered in my medical decision making (see chart for details).        Complex patient with HIV, diabetes, gastroparesis, many other dxs presents with abdominal pain and vomiting.  Most likely secondary to gastroparesis.  IV fluids, IV Reglan, IV Zofran, IV Dilaudid, basic labs.  Patient observed for multiple hours.  She continues to have abdominal pain with nausea.  Will admit for IV hydration, pain management, antiemetics.  Discussed with internal medicine admitting resident.  CRITICAL CARE Performed by: Nat Christen Total critical care time: 35 minutes Critical care time was exclusive of separately billable procedures and treating other patients. Critical care was necessary to treat or prevent imminent or life-threatening deterioration. Critical care was time spent personally by me on the following activities: development of treatment plan with patient and/or surrogate as well as nursing, discussions with consultants, evaluation of patient's response to treatment, examination of patient, obtaining history from patient or surrogate, ordering and performing  treatments and interventions, ordering and review of laboratory studies, ordering and review of radiographic studies, pulse oximetry and re-evaluation of patient's condition.  Final Clinical Impressions(s) / ED Diagnoses   Final diagnoses:  Abdominal pain, unspecified abdominal location  Gastroparesis  Anemia, unspecified  type  HIV infection, unspecified symptom status [facility])    ED Discharge Orders    None       Nat Christen, MD 02/06/19 1154    Nat Christen, MD 02/06/19 1831    Nat Christen, MD 02/19/19 1414

## 2019-02-06 NOTE — ED Notes (Signed)
Patient is resting comfortably. 

## 2019-02-06 NOTE — H&P (Addendum)
Date: 02/06/2019               Patient Name:  Kathy Frank MRN: [id number]  DOB: May 11, 1976 Age / Sex: 43 y.o., female   PCP: Lars Mage, MD         Medical Service: Internal Medicine Teaching Service         Attending Physician: Dr. Evette Doffing, Mallie Mussel, *    First Contact: Dr. Nathanial Rancher Pager: [phone]  Second Contact: Dr. Georgia Lopes Pager: [phone]       After Hours (After 5p/  First Contact Pager: [phone]  weekends / holidays): Second Contact Pager: [phone]   Chief Complaint: Nausea and Vomiting  History of Present Illness: 43 yo female with PMH of HIV, CKD 5, T2DM, ? gastroparesis, ACD, hx osteomyelitis, presented to the ED with abdominal pain, nausea and vomiting. She was sent here from short stay where she was told she was too ill for her epo injection.  She tells me she started feeling sick on Saturday she had some [employer].  It started with just nausea/heartburn and progressed to N/V pain and loose stools consistent with her occasional recurrent GI distress.  She denies light headedness, weakness but does feel more short of breath feels like she may need a breathing treatment. She was treated with reglan and dilaudid for her symptoms which have greatly improved on my arrival.   She report she has had several transfusions since the problems with her leg with no transfusion reaction.  She denies any fevers or sick contacts, chest pain, headache.  She is currently menstrauting, last menstraul cycle around the 12th of this month she feels they have been heavier recently, used about 27 pads during the last cycle, they last for around 5 days.   Her megace was discontinued a few months ago. She was recently seen by orthopedic surgery for follow up of her left stump dehiscence.  At that time she had already finished a two week course of Augmentin and was started on another two week course.        Meds:  Current Meds  Medication Sig  . amLODipine (NORVASC) 10 MG tablet  TAKE 1 TABLET BY MOUTH DAILY. (Patient taking differently: Take 10 mg by mouth 2 (two) times a day. )  . amoxicillin-clavulanate (AUGMENTIN) 875-125 MG tablet Take 1 tablet by mouth 2 (two) times daily.  . calcitRIOL (ROCALTROL) 0.25 MCG capsule Take 1 capsule (0.25 mcg total) by mouth daily.  . calcium acetate (PHOSLO) 667 MG capsule Take 2 capsules (1,334 mg total) by mouth 3 (three) times daily with meals.  . citalopram (CELEXA) 20 MG tablet Take 1 tablet (20 mg total) by mouth daily.  . furosemide (LASIX) 40 MG tablet TAKE 1 TABLET (40 MG TOTAL) BY MOUTH 2 (TWO) TIMES DAILY.  Marland Kitchen gabapentin (NEURONTIN) 300 MG capsule Take 1 capsule (300 mg total) by mouth 2 (two) times daily. (Patient taking differently: Take 300 mg by mouth 2 (two) times daily as needed. amputation)  . Insulin Glargine (LANTUS) 100 UNIT/ML Solostar Pen Inject 5 Units into the skin at bedtime.  Marland Kitchen JULUCA 50-25 MG TABS TAKE 1 TABLET BY MOUTH DAILY. PLEASE TAKE WITH LARGEST MEAL OF THE DAY. (Patient taking differently: Take 1 tablet by mouth daily with breakfast. )  . labetalol (NORMODYNE) 300 MG tablet TAKE 1 TABLET (300 MG TOTAL) BY MOUTH 2 (TWO) TIMES DAILY. (Patient taking differently: Take 300 mg by mouth 3 (three) times daily. )  .  liraglutide (VICTOZA) 18 MG/3ML SOPN Inject 0.2 mLs (1.2 mg total) into the skin daily.  . megestrol (MEGACE) 40 MG tablet Take 1 tablet (40 mg total) by mouth daily.  . methocarbamol (ROBAXIN) 500 MG tablet Take 1 tablet (500 mg total) by mouth every 6 (six) hours as needed for muscle spasms.  . metoCLOPramide (REGLAN) 5 MG tablet Take 1 tablet (5 mg total) by mouth 2 (two) times daily with breakfast and lunch.  . oxyCODONE-acetaminophen (PERCOCET/ROXICET) 5-325 MG tablet Take 1 tablet by mouth every 6 (six) hours as needed.  . sodium bicarbonate 650 MG tablet Take 2 tablets (1,300 mg total) by mouth 2 (two) times daily. (Patient taking differently: Take 650 mg by mouth 2 (two) times daily. )      Allergies: Allergies as of 02/06/2019  . (No Known Allergies)   Past Medical History:  Diagnosis Date  . Below-knee amputation of left lower extremity ([place])   . CKD (chronic kidney disease) stage 5, GFR less than 15 ml/min (HCC) 06/19/2016   06/30/16: Seen by Dr. Posey Pronto [Nephrology]. Suspect etiologies to include diabetes and hypertension though cannot exclude HIV-associated nephropathy. Advised her against from using excessive Goody powder.  . Contact dermatitis 04/21/2016   07/29/16: Skin biopsy performed by [facility] notable for acute spongiotic dermatitis consistent with contact dermatitis.  . Cyclical vomiting    THC induced???  . Depression 06/28/2006   Qualifier: Diagnosis of  By: Riccardo Dubin MD, Todd    . Diabetes mellitus type 2 in obese ([place]) 06/28/1994  . DKA (diabetic ketoacidoses) ([place]) 12/2014  . Erosive esophagitis   . Esophageal reflux   . Essential hypertension 11/16/2013  . Gastroparesis    ? diabetic  . GERD (gastroesophageal reflux disease)   . History of blood transfusion   . Human immunodeficiency virus (HIV) disease ([place]) 04/23/2016  . Hypertension   . Moderate nonproliferative diabetic retinopathy of both eyes ([place]) 11/21/2014   11/14/14: Noted on retinal imaging; needs follow-up imaging in 6 months  05/22/16: Noted on retinal imaging again; needs follow-up imaging in 6 months  . Normocytic anemia   . TOBACCO USER 06/28/2006   Qualifier: Diagnosis of  By: Riccardo Dubin MD, Sherren Mocha    . Type II diabetes mellitus ([place]) 1994   diagnosed around 35  . Wound dehiscence   . Wound infection s/p L transmetatarsal amputation     Family History:  Family History  Problem Relation Age of Onset  . Diabetes Mother   . Diabetes Brother   . Diabetes Daughter   . Diabetes Daughter   . Mental retardation Brother        died from PNA  . Diabetes Maternal Grandmother      Social History:  Social History   Tobacco Use  . Smoking status: Former Smoker     Packs/day: 0.10    Years: 10.00    Pack years: 1.00    Types: Cigarettes    Start date: 11/17/2013    Quit date: 09/09/2014    Years since quitting: 4.4  . Smokeless tobacco: Never Used  Substance Use Topics  . Alcohol use: Yes    Comment: Occasionally.  . Drug use: Yes    Frequency: 4.0 times per week    Types: Marijuana    Review of Systems: A complete ROS was negative except as per HPI.   Physical Exam: Blood pressure (!) 170/98, pulse 86, temperature 98.2 F (36.8 C), temperature source Oral, resp. rate 20, SpO2 100 %. Physical  Exam Constitutional:      General: She is not in acute distress.    Appearance: She is not diaphoretic.  HENT:     Head: Normocephalic and atraumatic.  Cardiovascular:     Rate and Rhythm: Normal rate and regular rhythm.     Heart sounds: Murmur present. Systolic murmur present with a grade of 2/6. No friction rub. No gallop.   Pulmonary:     Effort: Pulmonary effort is normal. No respiratory distress.     Breath sounds: Normal breath sounds. No wheezing or rales.  Chest:     Chest wall: No tenderness.  Abdominal:     General: Bowel sounds are normal. There is no distension.     Palpations: Abdomen is soft. There is no mass.     Tenderness: There is generalized abdominal tenderness. There is no guarding or rebound.  Skin:    General: Skin is warm.     Comments: Left stump wound remains closed, some minimal pustular drainage present from wound without surrounding erythema  Neurological:     Mental Status: She is alert.  Psychiatric:        Mood and Affect: Mood normal.        Behavior: Behavior normal.      EKG: personally reviewed my interpretation is none available  CXR: personally reviewed my interpretation is none available  Assessment & Plan by Problem: Active Problems:   Abdominal pain  Acute on chronic generalized abdominal pain: with associated nausea, vomiting, diarrhea.  Augmentin could be playing a role in her symptoms  although she has been on it for some time now without issue and this is a chronic problem.  Very similar symptoms a little over one month ago. She also has been told she has gastroparesis (no formal diagnosis) which flares periodically. Another possibility is cannabis hyperemesis syndrome. Uncertain if renal disease is playing a role as well  -continue home gastroparesis treatment with reglan with meals -do think she needs to continue abx, will continue at renal dose starting tomorrow, if she is having side effects consistent with restarting consider switching abx to doxycycline  CKD5: Creatinine currently at baseline, being set up for dialysis outpatient  -no urgent need for dialysis -continue meds for metabolic bone disease, consider giving epo injection tomorrow since she missed it this morning  Blood loss anemia/Anemia of chronic disease: Hgb 6.7 on arrival with some associated shortness of breath, still having heavy menstrual periods, also has anemia of chronic disease  -Will transfuse 1 u PRBC's -consider epo injection tomorrow -fu iron studies  L AKA stump: some pustular drainage, no dehiscence, some pain  -continue abx per above -follow up with ortho  -switch to dilaudid PRN  HTN: in the setting of advanced CKD 5, worsened by fluid bolus received in ED  -start back home labetalol, amlodipine, lasix -was just finishing fluid bolus on my arrival, may need further diuresis  T2DM: well controlled outpatient  -SSI renal and low dose 2 u with meals  HIV: viral load undetectable 3 months ago  -continue Juluca 50-25   Dispo: Admit patient to Observation with expected length of stay less than 2 midnights.  Signed: [name], MD 02/06/2019, 7:58 PM

## 2019-02-07 ENCOUNTER — Encounter (HOSPITAL_COMMUNITY): Payer: Self-pay | Admitting: *Deleted

## 2019-02-07 ENCOUNTER — Encounter: Payer: Medicaid Other | Admitting: Internal Medicine

## 2019-02-07 ENCOUNTER — Other Ambulatory Visit: Payer: Self-pay

## 2019-02-07 ENCOUNTER — Telehealth: Payer: Self-pay | Admitting: *Deleted

## 2019-02-07 DIAGNOSIS — E113393 Type 2 diabetes mellitus with moderate nonproliferative diabetic retinopathy without macular edema, bilateral: Secondary | ICD-10-CM | POA: Diagnosis present

## 2019-02-07 DIAGNOSIS — R109 Unspecified abdominal pain: Secondary | ICD-10-CM | POA: Diagnosis not present

## 2019-02-07 DIAGNOSIS — R1013 Epigastric pain: Secondary | ICD-10-CM | POA: Diagnosis not present

## 2019-02-07 DIAGNOSIS — Z79899 Other long term (current) drug therapy: Secondary | ICD-10-CM | POA: Diagnosis not present

## 2019-02-07 DIAGNOSIS — R1084 Generalized abdominal pain: Secondary | ICD-10-CM | POA: Diagnosis not present

## 2019-02-07 DIAGNOSIS — Z87891 Personal history of nicotine dependence: Secondary | ICD-10-CM | POA: Diagnosis not present

## 2019-02-07 DIAGNOSIS — Z89612 Acquired absence of left leg above knee: Secondary | ICD-10-CM | POA: Diagnosis not present

## 2019-02-07 DIAGNOSIS — K219 Gastro-esophageal reflux disease without esophagitis: Secondary | ICD-10-CM | POA: Diagnosis present

## 2019-02-07 DIAGNOSIS — E1143 Type 2 diabetes mellitus with diabetic autonomic (poly)neuropathy: Secondary | ICD-10-CM | POA: Diagnosis not present

## 2019-02-07 DIAGNOSIS — E1122 Type 2 diabetes mellitus with diabetic chronic kidney disease: Secondary | ICD-10-CM | POA: Diagnosis present

## 2019-02-07 DIAGNOSIS — K3184 Gastroparesis: Secondary | ICD-10-CM | POA: Diagnosis present

## 2019-02-07 DIAGNOSIS — E872 Acidosis: Secondary | ICD-10-CM | POA: Diagnosis present

## 2019-02-07 DIAGNOSIS — Z79891 Long term (current) use of opiate analgesic: Secondary | ICD-10-CM | POA: Diagnosis not present

## 2019-02-07 DIAGNOSIS — I120 Hypertensive chronic kidney disease with stage 5 chronic kidney disease or end stage renal disease: Secondary | ICD-10-CM | POA: Diagnosis present

## 2019-02-07 DIAGNOSIS — N920 Excessive and frequent menstruation with regular cycle: Secondary | ICD-10-CM | POA: Diagnosis present

## 2019-02-07 DIAGNOSIS — Z794 Long term (current) use of insulin: Secondary | ICD-10-CM | POA: Diagnosis not present

## 2019-02-07 DIAGNOSIS — Z810 Family history of intellectual disabilities: Secondary | ICD-10-CM | POA: Diagnosis not present

## 2019-02-07 DIAGNOSIS — F329 Major depressive disorder, single episode, unspecified: Secondary | ICD-10-CM | POA: Diagnosis present

## 2019-02-07 DIAGNOSIS — Z8719 Personal history of other diseases of the digestive system: Secondary | ICD-10-CM | POA: Diagnosis not present

## 2019-02-07 DIAGNOSIS — Z20828 Contact with and (suspected) exposure to other viral communicable diseases: Secondary | ICD-10-CM | POA: Diagnosis present

## 2019-02-07 DIAGNOSIS — D638 Anemia in other chronic diseases classified elsewhere: Secondary | ICD-10-CM

## 2019-02-07 DIAGNOSIS — D500 Iron deficiency anemia secondary to blood loss (chronic): Secondary | ICD-10-CM | POA: Diagnosis present

## 2019-02-07 DIAGNOSIS — B20 Human immunodeficiency virus [HIV] disease: Secondary | ICD-10-CM | POA: Diagnosis present

## 2019-02-07 DIAGNOSIS — E669 Obesity, unspecified: Secondary | ICD-10-CM | POA: Diagnosis present

## 2019-02-07 DIAGNOSIS — N185 Chronic kidney disease, stage 5: Secondary | ICD-10-CM | POA: Diagnosis present

## 2019-02-07 DIAGNOSIS — G8929 Other chronic pain: Secondary | ICD-10-CM | POA: Diagnosis present

## 2019-02-07 DIAGNOSIS — D631 Anemia in chronic kidney disease: Secondary | ICD-10-CM | POA: Diagnosis present

## 2019-02-07 DIAGNOSIS — Z833 Family history of diabetes mellitus: Secondary | ICD-10-CM | POA: Diagnosis not present

## 2019-02-07 LAB — CBC
HCT: 24.2 % — ABNORMAL LOW (ref 36.0–46.0)
Hemoglobin: 7.8 g/dL — ABNORMAL LOW (ref 12.0–15.0)
MCH: 28.8 pg (ref 26.0–34.0)
MCHC: 32.2 g/dL (ref 30.0–36.0)
MCV: 89.3 fL (ref 80.0–100.0)
Platelets: 232 10*3/uL (ref 150–400)
RBC: 2.71 MIL/uL — ABNORMAL LOW (ref 3.87–5.11)
RDW: 17.4 % — ABNORMAL HIGH (ref 11.5–15.5)
WBC: 6.8 10*3/uL (ref 4.0–10.5)
nRBC: 0 % (ref 0.0–0.2)

## 2019-02-07 LAB — COMPREHENSIVE METABOLIC PANEL
ALT: 9 U/L (ref 0–44)
AST: 10 U/L — ABNORMAL LOW (ref 15–41)
Albumin: 3.3 g/dL — ABNORMAL LOW (ref 3.5–5.0)
Alkaline Phosphatase: 117 U/L (ref 38–126)
Anion gap: 11 (ref 5–15)
BUN: 35 mg/dL — ABNORMAL HIGH (ref 6–20)
CO2: 17 mmol/L — ABNORMAL LOW (ref 22–32)
Calcium: 7.6 mg/dL — ABNORMAL LOW (ref 8.9–10.3)
Chloride: 112 mmol/L — ABNORMAL HIGH (ref 98–111)
Creatinine, Ser: 6.63 mg/dL — ABNORMAL HIGH (ref 0.44–1.00)
GFR calc Af Amer: 8 mL/min — ABNORMAL LOW (ref >60)
GFR calc non Af Amer: 7 mL/min — ABNORMAL LOW (ref >60)
Glucose, Bld: 188 mg/dL — ABNORMAL HIGH (ref 70–99)
Potassium: 3.8 mmol/L (ref 3.5–5.1)
Sodium: 140 mmol/L (ref 135–145)
Total Bilirubin: 0.6 mg/dL (ref 0.3–1.2)
Total Protein: 7.7 g/dL (ref 6.5–8.1)

## 2019-02-07 LAB — GLUCOSE, CAPILLARY
Glucose-Capillary: 201 mg/dL — ABNORMAL HIGH (ref 70–99)
Glucose-Capillary: 108 mg/dL — ABNORMAL HIGH (ref 70–99)
Glucose-Capillary: 111 mg/dL — ABNORMAL HIGH (ref 70–99)
Glucose-Capillary: 105 mg/dL — ABNORMAL HIGH (ref 70–99)

## 2019-02-07 LAB — IRON AND TIBC
Iron: 25 ug/dL — ABNORMAL LOW (ref 28–170)
Saturation Ratios: 12 % (ref 10.4–31.8)
TIBC: 210 ug/dL — ABNORMAL LOW (ref 250–450)
UIBC: 185 ug/dL

## 2019-02-07 LAB — FERRITIN: Ferritin: 179 ng/mL (ref 11–307)

## 2019-02-07 MED ORDER — HYDROMORPHONE HCL 1 MG/ML IJ SOLN
1.0000 mg | Freq: Once | INTRAMUSCULAR | Status: AC
  Administered 2019-02-07: 1 mg via INTRAVENOUS
  Filled 2019-02-07: qty 1

## 2019-02-07 MED ORDER — JUVEN PO PACK
1.0000 | PACK | Freq: Two times a day (BID) | ORAL | Status: DC
  Administered 2019-02-08 – 2019-02-09 (×5): 1 via ORAL
  Filled 2019-02-07 (×5): qty 1

## 2019-02-07 MED ORDER — OXYCODONE-ACETAMINOPHEN 5-325 MG PO TABS
1.0000 | ORAL_TABLET | Freq: Four times a day (QID) | ORAL | Status: DC | PRN
  Administered 2019-02-08 (×2): 1 via ORAL
  Filled 2019-02-07 (×2): qty 1

## 2019-02-07 MED ORDER — ACETAMINOPHEN 325 MG PO TABS: 325.0000 mg | ORAL_TABLET | Freq: Four times a day (QID) | ORAL | Status: DC | PRN

## 2019-02-07 MED ORDER — DOLUTEGRAVIR SODIUM 50 MG PO TABS
50.0000 mg | ORAL_TABLET | Freq: Every day | ORAL | Status: DC
  Administered 2019-02-07 – 2019-02-09 (×3): 50 mg via ORAL
  Filled 2019-02-07 (×3): qty 1

## 2019-02-07 MED ORDER — JUVEN PO PACK: 1.0000 | PACK | Freq: Two times a day (BID) | ORAL | Status: DC

## 2019-02-07 MED ORDER — RILPIVIRINE HCL 25 MG PO TABS
25.0000 mg | ORAL_TABLET | Freq: Every day | ORAL | Status: DC
  Administered 2019-02-07 – 2019-02-09 (×3): 25 mg via ORAL
  Filled 2019-02-07 (×4): qty 1

## 2019-02-07 MED ORDER — MEGESTROL ACETATE 40 MG PO TABS
40.0000 mg | ORAL_TABLET | Freq: Every day | ORAL | Status: DC
  Administered 2019-02-07 – 2019-02-09 (×3): 40 mg via ORAL
  Filled 2019-02-07 (×4): qty 1

## 2019-02-07 MED ORDER — ONDANSETRON HCL 4 MG/2ML IJ SOLN
4.0000 mg | Freq: Four times a day (QID) | INTRAMUSCULAR | Status: DC | PRN
  Administered 2019-02-07 – 2019-02-08 (×6): 4 mg via INTRAVENOUS
  Filled 2019-02-07 (×6): qty 2

## 2019-02-07 NOTE — Telephone Encounter (Signed)
-----   Message from [name], MD sent at 02/06/2019  6:00 PM EDT ----- Ms. Creson is in need of an urgent clinic visit this week per Dr. Lacinda Axon, an ED clinician. Please have her seen in [facility] as soon as possible.

## 2019-02-07 NOTE — Progress Notes (Signed)
   Subjective: HD#0   Overnight: No acute overnight events  Today, RASHELL SHAMBAUGH was examined and evaluated at bedside this AM. She mentions complaints of nausea, vomiting, shortness of breath and abdominal pain. She mentions these symptoms starting on Sunday with dark colored emesis without obvious blood. She mentions her last meal was on Saturday. Denies any melena or BRBPR. She mentions continuing to make urine and having good follow up with Dr. Posey Pronto.   Objective:  Vital signs in last 24 hours: Vitals:   02/07/19 0045 02/07/19 0453 02/07/19 0526 02/07/19 1158  BP: (!) 174/83 (!) 180/73 (!) 176/93 (!) 168/84  Pulse: 86 90 90 81  Resp: 20 16 18 20   Temp: 98.6 F (37 C) 98.3 F (36.8 C) 98.4 F (36.9 C) 98.4 F (36.9 C)  TempSrc: Oral Oral Oral Oral  SpO2: 100% 100% 95% 100%  Weight:      Height:       Const: Appears somewhat uncomfortable and had to sit on the bed CV: RRR, no murmurs, gallop, rub Abd: Bowel sounds present, nondistended, nontender to palpation Ext: Left lower extremity AKA no sign of infection  Assessment/Plan:  Active Problems:   Abdominal pain  Ms. Rutten is a 43 year old African-American woman with HIV-well controlled, CKD stage V, type 2 diabetes mellitus, presumed gastroparesis, anemia of chronic disease, history of osteomyelitis status post left AKA who presented to [facility] ED on February 06, 2019 with progressively worsening abdominal pain, nausea and vomiting concerning for possible exacerbation of diabetic gastroparesis.  Acute on chronic generalized abdominal pain: This morning, she continues to endorse abdominal pain and nausea though no episode of emesis this morning.  She reports that previously she felt relief with Reglan and no improvement with Phenergan or Zofran.  She does not appear to be uremic and my suspicion for uremia induced emesis or abdominal pain is very low.  Her BUN is 35 with creatinine of 6.6 -Continue Reglan 5mg  BID w/ meals  CKD V w/  AG metabolic acidosis:  sCr 4.0<<9.8 (Baseline ~6.3), BUN 35<<38 -Continue to monitor -Continue calcitriol, calcium acetate  Normocytic Anemia (Anemia of chronic disease vs Blood loss): We are pending CBC from this morning, if unchanged then will give a dose of EPO prior to discharge..  Iron studies reveal low iron of 25, low saturation of 12 and ferritin of 179.  She does have a history of menorrhagia and is on Megace.  Chart review shows q weekly Epogen 10000U. -Follow-up CBC -Continue Megace  L AKA stump: No signs of infection on evaluation of the left stump -Continue Augmentin  HTN:  -Continue labetalol, amlodipine and Lasix  T2DM:  -SSI renal and low dose 2 u with meals  HIV:  -Continue Juluca 50-25  Jean Rosenthal, MD 02/07/2019, 1:04 PM Pager: [phone] Internal Medicine Teaching Service

## 2019-02-07 NOTE — Telephone Encounter (Signed)
Called pt, she is inpt at this time

## 2019-02-08 DIAGNOSIS — R1013 Epigastric pain: Secondary | ICD-10-CM

## 2019-02-08 DIAGNOSIS — D649 Anemia, unspecified: Secondary | ICD-10-CM

## 2019-02-08 LAB — BPAM RBC
Blood Product Expiration Date: 202007152359
Blood Product Expiration Date: 202007282359
ISSUE DATE / TIME: 202006300505
ISSUE DATE / TIME: 202006300939
Unit Type and Rh: 7300
Unit Type and Rh: 7300

## 2019-02-08 LAB — RENAL FUNCTION PANEL
Albumin: 3 g/dL — ABNORMAL LOW (ref 3.5–5.0)
Anion gap: 10 (ref 5–15)
BUN: 39 mg/dL — ABNORMAL HIGH (ref 6–20)
CO2: 17 mmol/L — ABNORMAL LOW (ref 22–32)
Calcium: 7.5 mg/dL — ABNORMAL LOW (ref 8.9–10.3)
Chloride: 111 mmol/L (ref 98–111)
Creatinine, Ser: 6.61 mg/dL — ABNORMAL HIGH (ref 0.44–1.00)
GFR calc Af Amer: 8 mL/min — ABNORMAL LOW (ref >60)
GFR calc non Af Amer: 7 mL/min — ABNORMAL LOW (ref >60)
Glucose, Bld: 137 mg/dL — ABNORMAL HIGH (ref 70–99)
Phosphorus: 4.1 mg/dL (ref 2.5–4.6)
Potassium: 3.7 mmol/L (ref 3.5–5.1)
Sodium: 138 mmol/L (ref 135–145)

## 2019-02-08 LAB — GLUCOSE, CAPILLARY
Glucose-Capillary: 180 mg/dL — ABNORMAL HIGH (ref 70–99)
Glucose-Capillary: 141 mg/dL — ABNORMAL HIGH (ref 70–99)
Glucose-Capillary: 84 mg/dL (ref 70–99)
Glucose-Capillary: 90 mg/dL (ref 70–99)

## 2019-02-08 LAB — CBC
HCT: 23.1 % — ABNORMAL LOW (ref 36.0–46.0)
Hemoglobin: 7.7 g/dL — ABNORMAL LOW (ref 12.0–15.0)
MCH: 29.6 pg (ref 26.0–34.0)
MCHC: 33.3 g/dL (ref 30.0–36.0)
MCV: 88.8 fL (ref 80.0–100.0)
Platelets: 231 10*3/uL (ref 150–400)
RBC: 2.6 MIL/uL — ABNORMAL LOW (ref 3.87–5.11)
RDW: 17.6 % — ABNORMAL HIGH (ref 11.5–15.5)
WBC: 6.9 10*3/uL (ref 4.0–10.5)
nRBC: 0 % (ref 0.0–0.2)

## 2019-02-08 LAB — TYPE AND SCREEN
ABO/RH(D): B POS
Antibody Screen: NEGATIVE
Unit division: 0
Unit division: 0

## 2019-02-08 MED ORDER — HYDROMORPHONE HCL 1 MG/ML IJ SOLN
1.0000 mg | Freq: Once | INTRAMUSCULAR | Status: AC
  Administered 2019-02-08: 1 mg via INTRAVENOUS
  Filled 2019-02-08: qty 1

## 2019-02-08 MED ORDER — PANTOPRAZOLE SODIUM 40 MG IV SOLR
40.0000 mg | Freq: Every day | INTRAVENOUS | Status: DC
  Administered 2019-02-08 – 2019-02-09 (×2): 40 mg via INTRAVENOUS
  Filled 2019-02-08 (×2): qty 40

## 2019-02-08 NOTE — Progress Notes (Signed)
Subjective:   Kathy Frank is visibly uncomfortable with abdominal pain. She says the nausea and vomiting has not improved, and she cannot keep down any fluids or food. She's frustrated because in the past the nausea nd vomiting has improved by now. Denies blood or bilious vomiting.   Objective:  Vital signs in last 24 hours: Vitals:   02/07/19 1006 02/07/19 1158 02/07/19 2212 02/08/19 0539  BP: (!) 164/92 (!) 168/84 (!) 170/87 (!) 159/82  Pulse: 88 81 87 85  Resp: 18 20 16 17   Temp: 98.3 F (36.8 C) 98.4 F (36.9 C) 98.6 F (37 C) 98.4 F (36.9 C)  TempSrc: Oral Oral Axillary   SpO2: 96% 100% 99% 96%  Weight:    73.3 kg  Height:       Physical Exam Constitutional:      General: She is in acute distress.     Appearance: She is normal weight. She is ill-appearing.  HENT:     Head: Normocephalic and atraumatic.  Cardiovascular:     Rate and Rhythm: Normal rate and regular rhythm.     Heart sounds: Normal heart sounds. No murmur. No friction rub. No gallop.   Pulmonary:     Effort: Pulmonary effort is normal. No respiratory distress.     Breath sounds: Normal breath sounds. No wheezing or rales.  Abdominal:     General: Bowel sounds are normal. There is distension. There is no abdominal bruit. There are no signs of injury.     Palpations: There is no hepatomegaly or mass.     Tenderness: There is abdominal tenderness in the epigastric area.     Comments: Distention with mild tenderness to palpation at the epigastric region   Skin:    General: Skin is warm.  Neurological:     General: No focal deficit present.     Mental Status: She is alert.  Psychiatric:        Mood and Affect: Mood is anxious.        Behavior: Behavior normal.    Assessment/Plan:  Principal Problem:   Gastroparesis Active Problems:   Diabetes mellitus type 2 in obese (HCC)   Acute on chronic anemia   CKD (chronic kidney disease), stage V (HCC)   Abdominal pain  Acute on chronic generalized  abdominal pain: This morning, she continues to endorse abdominal pain and nausea and cannot keep any fluids down.  -Continue Reglan 5mg  BID w/ meals - CKD V w/ AG metabolic acidosis: sCr at 6.61<<6.9 two days ago (Baseline ~6.3), BUN 39 -Continue to monitor -Continue calcitriol, calcium acetate - One time IV dilaudid 1mg  dose   - Consider KUB if pain persists   Normocytic Anemia (Anemia of chronic disease vs Blood loss): Hgb was stable at 7.7 today. Patient did not show adequate jump in Hgb, and would likely benefit from EPO injection prior to discharge.  Iron studies reveal low iron of 25, low saturation of 12 and ferritin of 179.  She does have a history of menorrhagia and is on Megace. Chart review shows q weekly Epogen 10000U. -Follow-up CBC -Continue Megace - EPO injection prior to discharge  L AKA stump: No signs of infection on evaluation of the left stump. Stable. -Continue Augmentin  HTN: -Continue labetalol, amlodipine and Lasix  - Will continue to monitor  T2DM:  -SSI renal and low dose 2 u with meals  YSA:YTKZ controlled -Continue Juluca 50-25  Dispo: Anticipated discharge in approximately 1-2 day(s).   Earlene Plater,  MD 02/08/2019, 7:29 AM Pager: [phone]

## 2019-02-08 NOTE — Progress Notes (Signed)
Offered to update patient's family on plan of care but patient denied. Will continue to monitor.  Hiram Comber, RN 02/08/2019 5:00 PM

## 2019-02-08 NOTE — Progress Notes (Signed)
  Date: 02/08/2019  Patient name: Kathy Frank  Medical record number: [id number]  Date of birth: 1976/06/08   I have seen and evaluated this patient and I have discussed the plan of care with the house staff. Please see Dr. Redgie Grayer note for complete details. I concur with his findings and plan.     Sid Falcon, MD 02/08/2019, 6:47 PM

## 2019-02-09 ENCOUNTER — Encounter: Payer: Self-pay | Admitting: *Deleted

## 2019-02-09 LAB — RENAL FUNCTION PANEL
Albumin: 2.8 g/dL — ABNORMAL LOW (ref 3.5–5.0)
Anion gap: 13 (ref 5–15)
BUN: 52 mg/dL — ABNORMAL HIGH (ref 6–20)
CO2: 14 mmol/L — ABNORMAL LOW (ref 22–32)
Calcium: 7.6 mg/dL — ABNORMAL LOW (ref 8.9–10.3)
Chloride: 111 mmol/L (ref 98–111)
Creatinine, Ser: 6.88 mg/dL — ABNORMAL HIGH (ref 0.44–1.00)
GFR calc Af Amer: 8 mL/min — ABNORMAL LOW (ref >60)
GFR calc non Af Amer: 7 mL/min — ABNORMAL LOW (ref >60)
Glucose, Bld: 95 mg/dL (ref 70–99)
Phosphorus: 3.9 mg/dL (ref 2.5–4.6)
Potassium: 3.7 mmol/L (ref 3.5–5.1)
Sodium: 138 mmol/L (ref 135–145)

## 2019-02-09 LAB — CBC
HCT: 22.6 % — ABNORMAL LOW (ref 36.0–46.0)
Hemoglobin: 7.3 g/dL — ABNORMAL LOW (ref 12.0–15.0)
MCH: 29.1 pg (ref 26.0–34.0)
MCHC: 32.3 g/dL (ref 30.0–36.0)
MCV: 90 fL (ref 80.0–100.0)
Platelets: 232 10*3/uL (ref 150–400)
RBC: 2.51 MIL/uL — ABNORMAL LOW (ref 3.87–5.11)
RDW: 17.3 % — ABNORMAL HIGH (ref 11.5–15.5)
WBC: 5.8 10*3/uL (ref 4.0–10.5)
nRBC: 0 % (ref 0.0–0.2)

## 2019-02-09 LAB — GLUCOSE, CAPILLARY
Glucose-Capillary: 114 mg/dL — ABNORMAL HIGH (ref 70–99)
Glucose-Capillary: 115 mg/dL — ABNORMAL HIGH (ref 70–99)

## 2019-02-09 MED ORDER — DARBEPOETIN ALFA 40 MCG/0.4ML IJ SOSY
40.0000 ug | PREFILLED_SYRINGE | Freq: Once | INTRAMUSCULAR | Status: DC
  Filled 2019-02-09: qty 0.4

## 2019-02-09 NOTE — Discharge Summary (Signed)
Name: Kathy Frank MRN: [id number] DOB: 08-Oct-1975 43 y.o. PCP: Lars Mage, MD  Date of Admission: 02/06/2019 11:09 AM Date of Discharge: 02/09/2019 Attending Physician: No att. providers found  Discharge Diagnosis: 1. Acute on chronic generalized abdominal pain secondary to gastroparesis:Pt initially presented with abdominal bloating, abdominal pain with nausea and vomiting.  -Continue Reglan5mg  BID w/ meals -CKDV w/ AG metabolic DPOEUMPN:TIRWE3.15(QMGQQPYP ~6.3) -Continue calcitriol, calcium acetate  Normocytic Anemia (Anemia of chronic diseasevs Blood loss):Hgb was stable at 7.3 today. Patient did not show adequate jump in Hgb, and would likely benefit from EPO injection prior to discharge.Iron studies reveal low iron of 25, low saturation of 12 and ferritin of 179. She does have a history of menorrhagia and is on Megace. Chart review shows qweekly Epogen 10000U. -Follow-up CBC -Continue Megace - Continue Epogen as an outpatient after discharge.  L AKA stump:No signs of infection on evaluation of the left stump. Stable. -Continue Augmentin  HTN: Stable on the following regimen - amlodipine 10 mg once daily in the AM -  Lasix40 mg BID - Labetalol 300 mg BID  T2DM:Well controlled on the following regimen --Lantus 5U at bedtime -Victoza 0.37mLs injection daily  PJK:DTOI controlled -Continue Juluca 50-25 upon discharge  Discharge Medications: Allergies as of 02/09/2019   No Known Allergies     Medication List    TAKE these medications   amLODipine 10 MG tablet Commonly known as: NORVASC TAKE 1 TABLET BY MOUTH DAILY. What changed: when to take this   amoxicillin-clavulanate 875-125 MG tablet Commonly known as: AUGMENTIN Take 1 tablet by mouth 2 (two) times daily.   calcitRIOL 0.25 MCG capsule Commonly known as: ROCALTROL Take 1 capsule (0.25 mcg total) by mouth daily.   calcium acetate 667 MG capsule Commonly known as: PHOSLO Take 2  capsules (1,334 mg total) by mouth 3 (three) times daily with meals.   citalopram 20 MG tablet Commonly known as: CeleXA Take 1 tablet (20 mg total) by mouth daily.   furosemide 40 MG tablet Commonly known as: LASIX TAKE 1 TABLET (40 MG TOTAL) BY MOUTH 2 (TWO) TIMES DAILY.   gabapentin 300 MG capsule Commonly known as: NEURONTIN Take 1 capsule (300 mg total) by mouth 2 (two) times daily. What changed:   when to take this  reasons to take this  additional instructions   glucose blood test strip Commonly known as: Accu-Chek Guide USE TO TEST BLOOD SUGAR 3 TIMES DAILY . Diagnosis code  E11.69, E66.9   Insulin Glargine 100 UNIT/ML Solostar Pen Commonly known as: LANTUS Inject 5 Units into the skin at bedtime.   Juluca 50-25 MG Tabs Generic drug: Dolutegravir-Rilpivirine TAKE 1 TABLET BY MOUTH DAILY. PLEASE TAKE WITH LARGEST MEAL OF THE DAY. What changed: See the new instructions.   labetalol 300 MG tablet Commonly known as: NORMODYNE TAKE 1 TABLET (300 MG TOTAL) BY MOUTH 2 (TWO) TIMES DAILY. What changed: when to take this   liraglutide 18 MG/3ML Sopn Commonly known as: Victoza Inject 0.2 mLs (1.2 mg total) into the skin daily.   megestrol 40 MG tablet Commonly known as: MEGACE Take 1 tablet (40 mg total) by mouth daily.   methocarbamol 500 MG tablet Commonly known as: ROBAXIN Take 1 tablet (500 mg total) by mouth every 6 (six) hours as needed for muscle spasms.   metoCLOPramide 5 MG tablet Commonly known as: Reglan Take 1 tablet (5 mg total) by mouth 2 (two) times daily with breakfast and lunch.   oxyCODONE-acetaminophen 5-325 MG  tablet Commonly known as: PERCOCET/ROXICET Take 1 tablet by mouth every 6 (six) hours as needed.   sodium bicarbonate 650 MG tablet Take 2 tablets (1,300 mg total) by mouth 2 (two) times daily. What changed: how much to take       Disposition and follow-up:   Kathy Frank was discharged [facility]  in stable condition.  At the hospital follow up visit please address:  1. Labs / imaging needed at time of follow-up -CBC -BMP  2.  Pending labs/ test needing follow-up: Gastric emptying study is ordered for the future by Dr. Lars Mage.  Follow-up Appointments:   Patient has a scheduled hospital follow up visit on 7/9.   Hospital Course by problem list: Acute on chronic generalized abdominal pain:Pt initially presented with nausea, vomiting, loose stools and abdominal pain that was consistent with previous episodes of gastroparesis. She was supported with IVF, IV pain medications and was slowly weaned off as tolerated. By day of discharge, she no longer had any abdominal pain and had a bowel movement. She is able to eat and drink without N/V as well. Patient could benefit from a gastric emptying study in the future. -Continue Reglan5mg  BID w/ meals. She will need to be off of this medication before the gastric emptying study. -Continue calcitriol, calcium acetate  Normocytic Anemia (Anemia of chronic diseasevs Blood loss):Hgb was stable at 7.3 on day of discharge after transfusion. On initial presentation, the patient's hemoglobin was 6.7. Patient did not show adequate jump in Hgb, and would likely benefit from EPO injections.Iron studies reveal low iron of 25, low saturation of 12 and ferritin of 179. She does have a history of menorrhagia and is on Megace. Chart review shows qweekly Epogen 10000U. -Follow-up CBC -Continue Megace - Continue Epogen as an outpatient after discharge.  L AKA stump:No signs of infection on evaluation of the left stump. Stable. -Continue Augmentin  HTN: - amlodipine 10 mg once daily in the AM -  Lasix40 mg BID - Labetalol 300 mg BID  T2DM: -Lantus 5U at bedtime -Victoza 0.18mLs injection daily  KDT:OIZT controlled -Continue Juluca 50-25 upon discharge  Discharge Vitals:   BP (!) 157/86 (BP Location: Right Arm)   Pulse 83   Temp  99.1 F (37.3 C)   Resp 18   Ht 5\' 5"  (1.651 m)   Wt 73.3 kg   SpO2 100%   BMI 26.89 kg/m   Pertinent Labs, Studies, and Procedures:  CBC    Component Value Date/Time   WBC 5.8 02/09/2019 0351   RBC 2.51 (L) 02/09/2019 0351   HGB 7.3 (L) 02/09/2019 0351   HGB 9.8 (L) 07/18/2018 1133   HCT 22.6 (L) 02/09/2019 0351   HCT 19.3 (L) 10/09/2018 1405   PLT 232 02/09/2019 0351   PLT 417 07/18/2018 1133   MCV 90.0 02/09/2019 0351   MCV 89 07/18/2018 1133   MCH 29.1 02/09/2019 0351   MCHC 32.3 02/09/2019 0351   RDW 17.3 (H) 02/09/2019 0351   RDW 12.9 07/18/2018 1133   LYMPHSABS 1.2 02/06/2019 1128   LYMPHSABS 1.6 12/23/2017 1558   MONOABS 0.4 02/06/2019 1128   EOSABS 0.1 02/06/2019 1128   EOSABS 0.2 12/23/2017 1558   BASOSABS 0.1 02/06/2019 1128   BASOSABS 0.1 12/23/2017 1558   BMP Latest Ref Rng & Units 02/09/2019 02/08/2019 02/07/2019  Glucose 70 - 99 mg/dL 95 137(H) 188(H)  BUN 6 - 20 mg/dL 52(H) 39(H) 35(H)  Creatinine 0.44 - 1.00 mg/dL  6.88(H) 6.61(H) 6.63(H)  BUN/Creat Ratio 9 - 23 - - -  Sodium 135 - 145 mmol/L 138 138 140  Potassium 3.5 - 5.1 mmol/L 3.7 3.7 3.8  Chloride 98 - 111 mmol/L 111 111 112(H)  CO2 22 - 32 mmol/L 14(L) 17(L) 17(L)  Calcium 8.9 - 10.3 mg/dL 7.6(L) 7.5(L) 7.6(L)     Discharge Instructions: Discharge Instructions    Call MD for:  difficulty breathing, headache or visual disturbances   Complete by: As directed    Call MD for:  persistant nausea and vomiting   Complete by: As directed    Call MD for:  severe uncontrolled pain   Complete by: As directed    Diet - low sodium heart healthy   Complete by: As directed    Discharge instructions   Complete by: As directed    Thank you for allowing [place] to care for you  Your symptoms are believed to be due to flair of your gastroparesis and anemia from menstruation and kidney disease - Continue to take you medications as prescribed - Please take Reglan as needed - The clinic doctors may arrange for  a study to evaluate your gastroparesis  Please follow up in clinic in the next 1 week   Increase activity slowly   Complete by: As directed       Signed: Earlene Plater, MD 02/11/2019, 6:03 PM   Pager: [phone]

## 2019-02-09 NOTE — Progress Notes (Signed)
  Date: 02/09/2019  Patient name: Kathy Frank  Medical record number: [id number]  Date of birth: Feb 06, 1976   I have seen and evaluated this patient and I have discussed the plan of care with the house staff. Please see Dr. Redgie Grayer note for complete details. I concur with his findings and plan.  Patient could benefit from outpatient GES.  She will need to be off of Reglan for this study.     Sid Falcon, MD 02/09/2019, 8:47 PM

## 2019-02-09 NOTE — Progress Notes (Signed)
Writer offered to call patient's family to inform them about her condition but patient denied. Will continue to monitor.

## 2019-02-09 NOTE — Progress Notes (Signed)
  Subjective: Kathy Frank reports feeling much better and appetite has greatly improved. No nausea or vomiting. Had a BM today. She is feeling well enough to go home.   Objective:  Vital signs in last 24 hours: Vitals:   02/08/19 1658 02/08/19 2228 02/09/19 0513 02/09/19 0852  BP: (!) 161/86 (!) 172/87 (!) 169/84 (!) 157/86  Pulse: 78 83 82 83  Resp: 18 16 18 18   Temp: 98.2 F (36.8 C) 98.5 F (36.9 C) 99.1 F (37.3 C)   TempSrc: Oral     SpO2: 100% 100% 99% 100%  Weight:      Height:       Physical Exam Vitals signs reviewed.  Constitutional:      General: She is not in acute distress.    Appearance: She is well-developed. She is not ill-appearing or toxic-appearing.     Comments: Sitting on chair at the bedside comfortably    HENT:     Head: Normocephalic and atraumatic.  Cardiovascular:     Rate and Rhythm: Normal rate and regular rhythm.     Heart sounds: Normal heart sounds. No murmur. No friction rub. No gallop.   Pulmonary:     Effort: Pulmonary effort is normal. No respiratory distress.     Breath sounds: Normal breath sounds. No wheezing or rales.  Abdominal:     General: Bowel sounds are normal. There is no distension.     Palpations: Abdomen is soft. There is no hepatomegaly or splenomegaly.     Tenderness: There is no abdominal tenderness.  Neurological:     General: No focal deficit present.     Mental Status: She is alert.  Psychiatric:        Mood and Affect: Mood normal.        Behavior: Behavior normal.    Assessment/Plan:  Principal Problem:   Gastroparesis Active Problems:   Diabetes mellitus type 2 in obese (HCC)   Acute on chronic anemia   CKD (chronic kidney disease), stage V (HCC)   Abdominal pain  Acute on chronic generalized abdominal pain:Pt says she no longer has any abdominal pain and had a bowel movement. She is able to eat and drink without N/V as well. -Continue Reglan5mg  BID w/ meals - CKDV w/ AG metabolic acidosis:sCr at  6.88 (Baseline ~6.3) -Continue calcitriol, calcium acetate  Normocytic Anemia (Anemia of chronic diseasevs Blood loss): Hgb was stable at 7.3 today. Patient did not show adequate jump in Hgb, and would likely benefit from EPO injection prior to discharge. Iron studies reveal low iron of 25, low saturation of 12 and ferritin of 179. She does have a history of menorrhagia and is on Megace. Chart review shows qweekly Epogen 10000U. -Follow-up CBC -Continue Megace - Continue Epogen as an outpatient after discharge.  L AKA stump:No signs of infection on evaluation of the left stump. Stable. -Continue Augmentin  HTN: -Continue labetalol, amlodipine and Lasix   T2DM: -SSI renal and low dose 2 u with meals  UTM:LYYT controlled -Continue Juluca 50-25 upon discharge  Dispo: Anticipated discharge today   Earlene Plater, MD 02/09/2019, 9:52 AM Pager: [phone]

## 2019-02-09 NOTE — Progress Notes (Signed)
Patient was discharged home by MD order; discharged instructions  review and give to patient with care notes; IV DIC; skin intact; patient will be escorted to the car by nurse tech via wheelchair.  

## 2019-02-13 ENCOUNTER — Other Ambulatory Visit: Payer: Self-pay

## 2019-02-13 ENCOUNTER — Ambulatory Visit (HOSPITAL_COMMUNITY)
Admission: RE | Admit: 2019-02-13 | Discharge: 2019-02-13 | Disposition: A | Discharge status: Home / Self Care | Payer: Medicaid Other | Source: Ambulatory Visit | Attending: Nephrology | Admitting: Nephrology

## 2019-02-13 VITALS — BP 159/83 | HR 79 | Temp 97.6°F | Resp 18

## 2019-02-13 DIAGNOSIS — N185 Chronic kidney disease, stage 5: Secondary | ICD-10-CM | POA: Insufficient documentation

## 2019-02-13 LAB — POCT HEMOGLOBIN-HEMACUE: Hemoglobin: 7.6 g/dL — ABNORMAL LOW (ref 12.0–15.0)

## 2019-02-13 MED ORDER — EPOETIN ALFA 10000 UNIT/ML IJ SOLN
INTRAMUSCULAR | Status: AC
  Administered 2019-02-13: 10000 [IU] via SUBCUTANEOUS
  Filled 2019-02-13: qty 1

## 2019-02-13 MED ORDER — EPOETIN ALFA 10000 UNIT/ML IJ SOLN
10000.0000 [IU] | INTRAMUSCULAR | Status: DC
  Administered 2019-02-13: 11:00:00 10000 [IU] via SUBCUTANEOUS

## 2019-02-13 NOTE — Progress Notes (Signed)
Hemocue 7.6 today. Called and informed Dr. Serita Grit office with no new orders received. Pt aware to go to ED with worsening shortness of breath, chest pain, signs/symptoms of bleeding.

## 2019-02-15 ENCOUNTER — Other Ambulatory Visit: Payer: Self-pay | Admitting: Internal Medicine

## 2019-02-16 ENCOUNTER — Ambulatory Visit: Payer: Medicaid Other

## 2019-02-16 ENCOUNTER — Encounter: Payer: Self-pay | Admitting: Internal Medicine

## 2019-02-20 ENCOUNTER — Ambulatory Visit (INDEPENDENT_AMBULATORY_CARE_PROVIDER_SITE_OTHER): Payer: Medicaid Other | Admitting: Physician Assistant

## 2019-02-20 ENCOUNTER — Other Ambulatory Visit: Payer: Self-pay

## 2019-02-20 ENCOUNTER — Ambulatory Visit (HOSPITAL_COMMUNITY)
Admission: RE | Admit: 2019-02-20 | Discharge: 2019-02-20 | Disposition: A | Discharge status: Home / Self Care | Payer: Medicaid Other | Source: Ambulatory Visit | Attending: Nephrology | Admitting: Nephrology

## 2019-02-20 ENCOUNTER — Encounter: Payer: Self-pay | Admitting: Physician Assistant

## 2019-02-20 VITALS — BP 166/79 | HR 79 | Temp 97.7°F

## 2019-02-20 VITALS — Ht 65.0 in | Wt 161.6 lb

## 2019-02-20 DIAGNOSIS — N185 Chronic kidney disease, stage 5: Secondary | ICD-10-CM | POA: Insufficient documentation

## 2019-02-20 DIAGNOSIS — Z89612 Acquired absence of left leg above knee: Secondary | ICD-10-CM

## 2019-02-20 DIAGNOSIS — E1142 Type 2 diabetes mellitus with diabetic polyneuropathy: Secondary | ICD-10-CM

## 2019-02-20 DIAGNOSIS — E440 Moderate protein-calorie malnutrition: Secondary | ICD-10-CM

## 2019-02-20 DIAGNOSIS — Z794 Long term (current) use of insulin: Secondary | ICD-10-CM

## 2019-02-20 DIAGNOSIS — D631 Anemia in chronic kidney disease: Secondary | ICD-10-CM

## 2019-02-20 LAB — POCT HEMOGLOBIN-HEMACUE: Hemoglobin: 7.7 g/dL — ABNORMAL LOW (ref 12.0–15.0)

## 2019-02-20 MED ORDER — EPOETIN ALFA 10000 UNIT/ML IJ SOLN
10000.0000 [IU] | INTRAMUSCULAR | Status: DC
  Administered 2019-02-20: 10000 [IU] via SUBCUTANEOUS

## 2019-02-20 MED ORDER — EPOETIN ALFA 10000 UNIT/ML IJ SOLN
INTRAMUSCULAR | Status: AC
  Filled 2019-02-20: qty 1

## 2019-02-20 MED ORDER — OXYCODONE-ACETAMINOPHEN 5-325 MG PO TABS: 1.0000 | ORAL_TABLET | Freq: Four times a day (QID) | ORAL | 0 refills | Status: DC | PRN

## 2019-02-20 NOTE — Progress Notes (Signed)
Office Visit Note   Patient: Kathy Frank           Date of Birth: 07/22/1976           MRN: [id number] Visit Date: 02/20/2019              Requested by: Lars Mage, MD [address] [place],  [place] [address] PCP: Lars Mage, MD  Chief Complaint  Patient presents with  . Left Leg - Routine Post Op    12/16/2018 Left AKA      HPI: The patient is a 43 year old woman who is seen for postoperative follow-up following a left above-the-knee amputation.  She reports that she is much improved over the past week. No drainage from the above the knee amputation site. Still having pain, but getting better. Shrinker is too large now. She reports she is going to [employer] later this week for prosthetic fitting.   Assessment & Plan: Visit Diagnoses:  1. Type 2 diabetes mellitus with diabetic polyneuropathy, with long-term current use of insulin ([place])   2. 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 CKD (chronic kidney disease), stage V ([place])   4. Moderate protein malnutrition ([place])   5. Anemia due to stage 5 chronic kidney disease, not on chronic dialysis [facility])     Plan: Given prescription for [employer] for smaller shrinker stocking.  Monitor area closely for any drainage or signs of infection.  We also discussed that she needs to monitor her wound right leg and foot closely and on a regular basis for any signs of breakdown or new pain that might indicate some ischemia of the right lower extremity. We will have the patient follow-up in 4 weeks or sooner should she develop any difficulties in the interim.   Follow-Up Instructions: Return in about 4 weeks (around 03/20/2019).   Ortho Exam  Patient is alert, oriented, no adenopathy, well-dressed, normal affect, normal respiratory effort. Left above the knee amputation incision healing well, without drainage, no signs of infection or cellulitis of the flaps. Soft and less edema over the area.   Imaging: No results  found. No images are attached to the encounter.  Labs: Lab Results  Component Value Date   HGBA1C 6.2 (H) 11/24/2018   HGBA1C 6.5 (H) 08/06/2018   HGBA1C 7.9 (A) 02/04/2018   ESRSEDRATE 21 08/06/2018   ESRSEDRATE 85 (H) 02/02/2017   CRP <0.8 10/07/2018   CRP <0.8 08/06/2018   CRP 42.4 (H) 02/02/2017   REPTSTATUS 01/02/2019 FINAL 12/31/2018   GRAMSTAIN  10/07/2018    NO WBC SEEN RARE GRAM POSITIVE COCCI IN PAIRS FEW GRAM POSITIVE RODS    CULT MULTIPLE SPECIES PRESENT, SUGGEST RECOLLECTION (A) 12/31/2018   LABORGA STAPHYLOCOCCUS AUREUS 07/08/2018     Lab Results  Component Value Date   ALBUMIN 2.8 (L) 02/09/2019   ALBUMIN 3.0 (L) 02/08/2019   ALBUMIN 3.3 (L) 02/07/2019    Lab Results  Component Value Date   MG 2.1 07/10/2018   MG 2.0 07/08/2018   MG 1.6 (L) 07/06/2018   Lab Results  Component Value Date   VD25OH 4.6 (L) 11/25/2018    No results found for: PREALBUMIN CBC EXTENDED Latest Ref Rng & Units 02/13/2019 02/09/2019 02/08/2019  WBC 4.0 - 10.5 K/uL - 5.8 6.9  RBC 3.87 - 5.11 MIL/uL - 2.51(L) 2.60(L)  HGB 12.0 - 15.0 g/dL 7.6(L) 7.3(L) 7.7(L)  HCT 36.0 - 46.0 % - 22.6(L) 23.1(L)  PLT 150 - 400 K/uL -  232 231  NEUTROABS 1.7 - 7.7 K/uL - - -  LYMPHSABS 0.7 - 4.0 K/uL - - -     Body mass index is 26.89 kg/m.  Orders:  No orders of the defined types were placed in this encounter.  Meds ordered this encounter  Medications  . oxyCODONE-acetaminophen (PERCOCET/ROXICET) 5-325 MG tablet    Sig: Take 1 tablet by mouth every 6 (six) hours as needed.    Dispense:  20 tablet    Refill:  0     Procedures: No procedures performed  Clinical Data: No additional findings.  ROS:  All other systems negative, except as noted in the HPI. Review of Systems  Objective: Vital Signs: Ht 5\' 5"  (1.651 m)   Wt 161 lb 9.6 oz (73.3 kg)   BMI 26.89 kg/m   Specialty Comments:  No specialty comments available.  PMFS History: Patient Active Problem List    Diagnosis Date Noted  . Abdominal pain 02/06/2019  . 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 Anemia due to chronic kidney disease   . Renal failure (ARF), acute on chronic ([place]) 11/24/2018  . Fall due to stumbling   . Osteomyelitis ([place]) 10/07/2018  . Dehiscence of amputation stump ([place])   . Moderate protein-calorie malnutrition ([place])   . Adjustment disorder with mixed anxiety and depressed mood 08/15/2018  . CKD (chronic kidney disease), stage V ([place])   . Acute on chronic anemia 07/03/2018  . Fecal incontinence 02/04/2018  . Healthcare maintenance 09/17/2017  . Right leg swelling 02/26/2017  . Superficial ulcerative lesion ([place]) 02/02/2017  . Aortic atherosclerosis ([place]) 09/30/2016  . Diabetic foot ulcer associated with diabetes mellitus due to underlying condition ([place]) 07/08/2016  . Human immunodeficiency virus (HIV) disease ([place]) 04/23/2016  . Gastroparesis   . Moderate nonproliferative diabetic retinopathy of both eyes ([place]) 11/21/2014  . Esophageal reflux   . Microscopic hematuria 10/31/2014  . Hypertension 11/16/2013  . TOBACCO USER 06/28/2006  . Depression 06/28/2006  . Diabetes mellitus type 2 in obese [facility]) 06/28/1994   Past Medical History:  Diagnosis Date  . Below-knee amputation of left lower extremity ([place])   . CKD (chronic kidney disease) stage 5, GFR less than 15 ml/min (HCC) 06/19/2016   06/30/16: Seen by Dr. Posey Pronto [Nephrology]. Suspect etiologies to include diabetes and hypertension though cannot exclude HIV-associated nephropathy. Advised her against from using excessive Goody powder.  . Contact dermatitis 04/21/2016   07/29/16: Skin biopsy performed by [facility] notable for acute spongiotic dermatitis consistent with contact dermatitis.  . Cyclical vomiting    THC induced???  . Depression 06/28/2006   Qualifier: Diagnosis of  By: Riccardo Dubin MD, [place]    . Diabetes mellitus type 2 in obese ([place]) 06/28/1994  . DKA  (diabetic ketoacidoses) ([place]) 12/2014  . Erosive esophagitis   . Esophageal reflux   . Essential hypertension 11/16/2013  . Gastroparesis    ? diabetic  . GERD (gastroesophageal reflux disease)   . History of blood transfusion   . Human immunodeficiency virus (HIV) disease ([place]) 04/23/2016  . Hypertension   . Moderate nonproliferative diabetic retinopathy of both eyes ([place]) 11/21/2014   11/14/14: Noted on retinal imaging; needs follow-up imaging in 6 months  05/22/16: Noted on retinal imaging again; needs follow-up imaging in 6 months  . Normocytic anemia   . TOBACCO USER 06/28/2006   Qualifier: Diagnosis of  By: Riccardo Dubin MD, Sherren Mocha    . Type II diabetes mellitus ([place]) 1994  diagnosed around 35  . Wound dehiscence   . Wound infection s/p L transmetatarsal amputation     Family History  Problem Relation Age of Onset  . Diabetes Mother   . Diabetes Brother   . Diabetes Daughter   . Diabetes Daughter   . Mental retardation Brother        died from PNA  . Diabetes Maternal Grandmother     Past Surgical History:  Procedure Laterality Date  . AMPUTATION Left 07/08/2018   Procedure: AMPUTATION FORTH RAY LEFT FOOT;  Surgeon: [name], MD;  Location: [place];  Service: Orthopedics;  Laterality: Left;  . AMPUTATION Left 08/09/2018   Procedure: Left Transmetatarsal Amputation;  Surgeon: [name], MD;  Location: [place];  Service: Orthopedics;  Laterality: Left;  . AMPUTATION Left 10/08/2018   Procedure: LEFT BELOW KNEE AMPUTATION;  Surgeon: [name], MD;  Location: [facility];  Service: Orthopedics;  Laterality: Left;  . AMPUTATION Left 10/28/2018   Procedure: REVISION BELOW KNEE AMPUTATION;  Surgeon: [name], MD;  Location: [place];  Service: Orthopedics;  Laterality: Left;  . AMPUTATION Left 12/16/2018   Procedure: LEFT ABOVE KNEE AMPUTATION;  Surgeon: [name], MD;  Location: [place];  Service: Orthopedics;  Laterality: Left;  . AV FISTULA PLACEMENT Left 10/14/2018    Procedure: Arteriovenous (Av) Fistula Creation Left Arm;  Surgeon: [name], MD;  Location: [place];  Service: Vascular;  Laterality: Left;  . LOWER EXTREMITY ANGIOGRAPHY N/A 07/05/2018   Procedure: LOWER EXTREMITY ANGIOGRAPHY;  Surgeon: Serafina Mitchell, MD;  Location: [place] [facility];  Service: Cardiovascular;  Laterality: N/A;  . PERIPHERAL VASCULAR BALLOON ANGIOPLASTY Left 07/05/2018   Procedure: PERIPHERAL VASCULAR BALLOON ANGIOPLASTY;  Surgeon: Serafina Mitchell, MD;  Location: [place] [facility];  Service: Cardiovascular;  Laterality: Left;  SFA  . STUMP REVISION Left 10/19/2018   Procedure: REVISION LEFT BELOW KNEE AMPUTATION;  Surgeon: [name], MD;  Location: [place];  Service: Orthopedics;  Laterality: Left;  . TUBAL LIGATION  2002   Social History   Occupational History    Employer: UNEMPLOYED  Tobacco Use  . Smoking status: Former Smoker    Packs/day: 0.10    Years: 10.00    Pack years: 1.00    Types: Cigarettes    Start date: 11/17/2013    Quit date: 09/09/2014    Years since quitting: 4.4  . Smokeless tobacco: Never Used  Substance and Sexual Activity  . Alcohol use: Yes    Comment: Occasionally.  . Drug use: Yes    Frequency: 4.0 times per week    Types: Marijuana  . Sexual activity: Not on file

## 2019-02-22 ENCOUNTER — Telehealth: Payer: Self-pay | Admitting: Internal Medicine

## 2019-02-22 NOTE — Telephone Encounter (Signed)
COVID-19 Pre-Screening Questions:02/22/19   Do you currently have a fever (>100 F), chills or unexplained body aches? NO   Are you currently experiencing new cough, shortness of breath, sore throat, runny nose? NO   Have you recently travelled outside the state of [place] in the last 14 days?NO   Have you been in contact with someone that is currently pending confirmation of Covid19 testing or has been confirmed to have the [place] virus?  NO  **If the patient answers NO to ALL questions -  advise the patient to please call the clinic before coming to the office should any symptoms develop.

## 2019-02-23 ENCOUNTER — Other Ambulatory Visit: Payer: Self-pay

## 2019-02-23 ENCOUNTER — Ambulatory Visit (INDEPENDENT_AMBULATORY_CARE_PROVIDER_SITE_OTHER): Payer: Medicaid Other | Admitting: Internal Medicine

## 2019-02-23 ENCOUNTER — Encounter: Payer: Self-pay | Admitting: Internal Medicine

## 2019-02-23 DIAGNOSIS — B20 Human immunodeficiency virus [HIV] disease: Secondary | ICD-10-CM

## 2019-02-23 MED ORDER — BICTEGRAVIR-EMTRICITAB-TENOFOV 50-200-25 MG PO TABS: 1.0000 | ORAL_TABLET | Freq: Every day | ORAL | 11 refills | Status: DC

## 2019-02-23 NOTE — Assessment & Plan Note (Signed)
Her adherence is perfect and her infection has been under excellent control with good CD4 reconstitution since her diagnosis 3 years ago.  However, she is correct that taking Juluca without a full meal could lead to poor absorption that could promote resistance.  I reviewed treatment options with our ID pharmacist Cassie Kuppelweiser and we decided to change her to [facility].  There is a growing body of data supporting the safety of Biktarvy in people with chronic kidney disease and even end-stage renal disease.  She will get blood work today and follow-up here in 3 months

## 2019-02-23 NOTE — Progress Notes (Signed)
Patient Active Problem List   Diagnosis Date Noted  . Human immunodeficiency virus (HIV) disease ([place]) 04/23/2016    Priority: Medium  . Abdominal pain 02/06/2019  . Hx of BKA, left ([place]) 12/16/2018  . Anemia due to chronic kidney disease   . Renal failure (ARF), acute on chronic ([place]) 11/24/2018  . Fall due to stumbling   . Osteomyelitis ([place]) 10/07/2018  . Dehiscence of amputation stump ([place])   . Moderate protein-calorie malnutrition ([place])   . Adjustment disorder with mixed anxiety and depressed mood 08/15/2018  . CKD (chronic kidney disease), stage V ([place])   . Acute on chronic anemia 07/03/2018  . Fecal incontinence 02/04/2018  . Healthcare maintenance 09/17/2017  . Right leg swelling 02/26/2017  . Superficial ulcerative lesion ([place]) 02/02/2017  . Aortic atherosclerosis ([place]) 09/30/2016  . Diabetic foot ulcer associated with diabetes mellitus due to underlying condition ([place]) 07/08/2016  . Gastroparesis   . Moderate nonproliferative diabetic retinopathy of both eyes ([place]) 11/21/2014  . Esophageal reflux   . Microscopic hematuria 10/31/2014  . Hypertension 11/16/2013  . TOBACCO USER 06/28/2006  . Depression 06/28/2006  . Diabetes mellitus type 2 in obese ([place]) 06/28/1994    Patient's Medications  New Prescriptions   BICTEGRAVIR-EMTRICITABINE-TENOFOVIR AF (BIKTARVY) 50-200-25 MG TABS TABLET    Take 1 tablet by mouth daily.  Previous Medications   AMLODIPINE (NORVASC) 10 MG TABLET    TAKE 1 TABLET BY MOUTH DAILY.   AMOXICILLIN-CLAVULANATE (AUGMENTIN) 875-125 MG TABLET    Take 1 tablet by mouth 2 (two) times daily.   CALCITRIOL (ROCALTROL) 0.25 MCG CAPSULE    Take 1 capsule (0.25 mcg total) by mouth daily.   CALCIUM ACETATE (PHOSLO) 667 MG CAPSULE    Take 2 capsules (1,334 mg total) by mouth 3 (three) times daily with meals.   CITALOPRAM (CELEXA) 20 MG TABLET    Take 1 tablet (20 mg total) by mouth daily.   FUROSEMIDE (LASIX) 40 MG TABLET    TAKE 1 TABLET (40 MG  TOTAL) BY MOUTH 2 (TWO) TIMES DAILY.   GABAPENTIN (NEURONTIN) 300 MG CAPSULE    Take 1 capsule (300 mg total) by mouth 2 (two) times daily.   GLUCOSE BLOOD (ACCU-CHEK GUIDE) TEST STRIP    USE TO TEST BLOOD SUGAR 3 TIMES DAILY . Diagnosis code  E11.69, E66.9   INSULIN GLARGINE (LANTUS) 100 UNIT/ML SOLOSTAR PEN    Inject 5 Units into the skin at bedtime.   LABETALOL (NORMODYNE) 300 MG TABLET    TAKE 1 TABLET (300 MG TOTAL) BY MOUTH 2 (TWO) TIMES DAILY.   LIRAGLUTIDE (VICTOZA) 18 MG/3ML SOPN    Inject 0.2 mLs (1.2 mg total) into the skin daily.   MEGESTROL (MEGACE) 40 MG TABLET    Take 1 tablet (40 mg total) by mouth daily.   METHOCARBAMOL (ROBAXIN) 500 MG TABLET    Take 1 tablet (500 mg total) by mouth every 6 (six) hours as needed for muscle spasms.   METOCLOPRAMIDE (REGLAN) 5 MG TABLET    Take 1 tablet (5 mg total) by mouth 2 (two) times daily with breakfast and lunch.   OXYCODONE-ACETAMINOPHEN (PERCOCET/ROXICET) 5-325 MG TABLET    Take 1 tablet by mouth every 6 (six) hours as needed.   SODIUM BICARBONATE 650 MG TABLET    Take 2 tablets (1,300 mg total) by mouth 2 (two) times daily.  Modified Medications   No medications on file  Discontinued Medications   JULUCA 50-25  MG TABS    TAKE 1 TABLET BY MOUTH DAILY. PLEASE TAKE WITH LARGEST MEAL OF THE DAY.    Subjective: Kathy Frank is in for her routine follow-up visit.  She recently underwent revision of her left leg amputation and now has a healing AKA stump.  She is hopeful that she can get fitted for a prosthesis soon.  She has had no problems obtaining, taking or tolerating her Juluca and never misses a dose.  However, she says that she is worried that it will stop working because she has difficulty eating a full meal due to her diabetic gastroparesis.  Review of Systems: Review of Systems  Constitutional: Negative for chills and fever.  Gastrointestinal: Negative for abdominal pain, nausea and vomiting.       As noted in HPI.    Past Medical  History:  Diagnosis Date  . Below-knee amputation of left lower extremity ([place])   . CKD (chronic kidney disease) stage 5, GFR less than 15 ml/min (HCC) 06/19/2016   06/30/16: Seen by Dr. Posey Pronto [Nephrology]. Suspect etiologies to include diabetes and hypertension though cannot exclude HIV-associated nephropathy. Advised her against from using excessive Goody powder.  . Contact dermatitis 04/21/2016   07/29/16: Skin biopsy performed by [facility] notable for acute spongiotic dermatitis consistent with contact dermatitis.  . Cyclical vomiting    THC induced???  . Depression 06/28/2006   Qualifier: Diagnosis of  By: Riccardo Dubin MD, [place]    . Diabetes mellitus type 2 in obese ([place]) 06/28/1994  . DKA (diabetic ketoacidoses) ([place]) 12/2014  . Erosive esophagitis   . Esophageal reflux   . Essential hypertension 11/16/2013  . Gastroparesis    ? diabetic  . GERD (gastroesophageal reflux disease)   . History of blood transfusion   . Human immunodeficiency virus (HIV) disease ([place]) 04/23/2016  . Hypertension   . Moderate nonproliferative diabetic retinopathy of both eyes ([place]) 11/21/2014   11/14/14: Noted on retinal imaging; needs follow-up imaging in 6 months  05/22/16: Noted on retinal imaging again; needs follow-up imaging in 6 months  . Normocytic anemia   . TOBACCO USER 06/28/2006   Qualifier: Diagnosis of  By: Riccardo Dubin MD, Sherren Mocha    . Type II diabetes mellitus ([place]) 1994   diagnosed around 34  . Wound dehiscence   . Wound infection s/p L transmetatarsal amputation     Social History   Tobacco Use  . Smoking status: Former Smoker    Packs/day: 0.10    Years: 10.00    Pack years: 1.00    Types: Cigarettes    Start date: 11/17/2013    Quit date: 09/09/2014    Years since quitting: 4.4  . Smokeless tobacco: Never Used  Substance Use Topics  . Alcohol use: Yes    Comment: Occasionally.  . Drug use: Yes    Frequency: 4.0 times per week    Types: Marijuana    Family History   Problem Relation Age of Onset  . Diabetes Mother   . Diabetes Brother   . Diabetes Daughter   . Diabetes Daughter   . Mental retardation Brother        died from PNA  . Diabetes Maternal Grandmother     No Known Allergies  Health Maintenance  Topic Date Due  . URINE MICROALBUMIN  12/05/2013  . OPHTHALMOLOGY EXAM  05/22/2017  . LIPID PANEL  10/07/2017  . FOOT EXAM  02/05/2019  . HEMOGLOBIN A1C  02/23/2019  . INFLUENZA VACCINE  03/11/2019  .  PAP SMEAR-Modifier  02/04/2021  . TETANUS/TDAP  09/18/2027  . PNEUMOCOCCAL POLYSACCHARIDE VACCINE AGE 47-64 HIGH RISK  Completed  . HIV Screening  Completed    Objective:  Vitals:   02/23/19 1559  BP: 123/77  Pulse: 77  Temp: 98 F (36.7 C)  Weight: 165 lb (74.8 kg)  Height: 5\' 5"  (1.651 m)   Body mass index is 27.46 kg/m.  Physical Exam Constitutional:      Comments: She is seated in a wheelchair.  She is in good spirits and talkative.  Cardiovascular:     Rate and Rhythm: Normal rate and regular rhythm.     Heart sounds: No murmur.  Pulmonary:     Effort: Pulmonary effort is normal.     Breath sounds: Normal breath sounds.  Abdominal:     Palpations: Abdomen is soft.     Tenderness: There is no abdominal tenderness.  Psychiatric:        Mood and Affect: Mood normal.     Lab Results Lab Results  Component Value Date   WBC 5.8 02/09/2019   HGB 7.7 (L) 02/20/2019   HCT 22.6 (L) 02/09/2019   MCV 90.0 02/09/2019   PLT 232 02/09/2019    Lab Results  Component Value Date   CREATININE 6.88 (H) 02/09/2019   BUN 52 (H) 02/09/2019   NA 138 02/09/2019   K 3.7 02/09/2019   CL 111 02/09/2019   CO2 14 (L) 02/09/2019    Lab Results  Component Value Date   ALT 9 02/07/2019   AST 10 (L) 02/07/2019   ALKPHOS 117 02/07/2019   BILITOT 0.6 02/07/2019    Lab Results  Component Value Date   CHOL 182 10/07/2016   HDL 54 10/07/2016   LDLCALC 113 (H) 10/07/2016   TRIG 76 10/07/2016   CHOLHDL 3.4 10/07/2016   Lab  Results  Component Value Date   LABRPR Non Reactive 02/02/2017   HIV 1 RNA Quant (copies/mL)  Date Value  10/18/2018 <20  09/13/2017 <20  10/07/2016 <20 NOT DETECTED   HIV-1 RNA Viral Load (copies/mL)  Date Value  12/23/2017 80  02/02/2017 80   CD4 T Cell Abs (/uL)  Date Value  10/29/2018 700  12/23/2017 450  09/13/2017 440     Problem List Items Addressed This Visit      Medium   Human immunodeficiency virus (HIV) disease (HCC) (Chronic)    Her adherence is perfect and her infection has been under excellent control with good CD4 reconstitution since her diagnosis 3 years ago.  However, she is correct that taking Juluca without a full meal could lead to poor absorption that could promote resistance.  I reviewed treatment options with our ID pharmacist Cassie Kuppelweiser and we decided to change her to [facility].  There is a growing body of data supporting the safety of Biktarvy in people with chronic kidney disease and even end-stage renal disease.  She will get blood work today and follow-up here in 3 months      Relevant Medications   bictegravir-emtricitabine-tenofovir AF (BIKTARVY) 50-200-25 MG TABS tablet   Other Relevant Orders   T-helper cell (CD4)- ([facility] clinic only)   HIV-1 RNA quant-no reflex-bld        Michel Bickers, MD [facility] for [place] [phone] pager   [phone] cell 02/23/2019, 4:42 PM

## 2019-02-24 LAB — T-HELPER CELL (CD4) - (RCID CLINIC ONLY)
CD4 % Helper T Cell: 34 % (ref 33–65)
CD4 T Cell Abs: 526 /uL (ref 400–1790)

## 2019-02-24 NOTE — Progress Notes (Signed)
HPI: Kathy Frank is a 43 y.o. female who presents to the [facility] to follow-up with Dr. Megan Salon for her HIV infection.  Patient Active Problem List   Diagnosis Date Noted  . Abdominal pain 02/06/2019  . Hx of BKA, left ([place]) 12/16/2018  . Anemia due to chronic kidney disease   . Renal failure (ARF), acute on chronic ([place]) 11/24/2018  . Fall due to stumbling   . Osteomyelitis ([place]) 10/07/2018  . Dehiscence of amputation stump ([place])   . Moderate protein-calorie malnutrition ([place])   . Adjustment disorder with mixed anxiety and depressed mood 08/15/2018  . CKD (chronic kidney disease), stage V ([place])   . Acute on chronic anemia 07/03/2018  . Fecal incontinence 02/04/2018  . Healthcare maintenance 09/17/2017  . Right leg swelling 02/26/2017  . Superficial ulcerative lesion ([place]) 02/02/2017  . Aortic atherosclerosis ([place]) 09/30/2016  . Diabetic foot ulcer associated with diabetes mellitus due to underlying condition ([place]) 07/08/2016  . Human immunodeficiency virus (HIV) disease ([place]) 04/23/2016  . Gastroparesis   . Moderate nonproliferative diabetic retinopathy of both eyes ([place]) 11/21/2014  . Esophageal reflux   . Microscopic hematuria 10/31/2014  . Hypertension 11/16/2013  . TOBACCO USER 06/28/2006  . Depression 06/28/2006  . Diabetes mellitus type 2 in obese ([place]) 06/28/1994    Patient's Medications  New Prescriptions   BICTEGRAVIR-EMTRICITABINE-TENOFOVIR AF (BIKTARVY) 50-200-25 MG TABS TABLET    Take 1 tablet by mouth daily.  Previous Medications   AMLODIPINE (NORVASC) 10 MG TABLET    TAKE 1 TABLET BY MOUTH DAILY.   AMOXICILLIN-CLAVULANATE (AUGMENTIN) 875-125 MG TABLET    Take 1 tablet by mouth 2 (two) times daily.   CALCITRIOL (ROCALTROL) 0.25 MCG CAPSULE    Take 1 capsule (0.25 mcg total) by mouth daily.   CALCIUM ACETATE (PHOSLO) 667 MG CAPSULE    Take 2 capsules (1,334 mg total) by mouth 3 (three) times daily with meals.   CITALOPRAM (CELEXA) 20 MG TABLET    Take  1 tablet (20 mg total) by mouth daily.   FUROSEMIDE (LASIX) 40 MG TABLET    TAKE 1 TABLET (40 MG TOTAL) BY MOUTH 2 (TWO) TIMES DAILY.   GABAPENTIN (NEURONTIN) 300 MG CAPSULE    Take 1 capsule (300 mg total) by mouth 2 (two) times daily.   GLUCOSE BLOOD (ACCU-CHEK GUIDE) TEST STRIP    USE TO TEST BLOOD SUGAR 3 TIMES DAILY . Diagnosis code  E11.69, E66.9   INSULIN GLARGINE (LANTUS) 100 UNIT/ML SOLOSTAR PEN    Inject 5 Units into the skin at bedtime.   LABETALOL (NORMODYNE) 300 MG TABLET    TAKE 1 TABLET (300 MG TOTAL) BY MOUTH 2 (TWO) TIMES DAILY.   LIRAGLUTIDE (VICTOZA) 18 MG/3ML SOPN    Inject 0.2 mLs (1.2 mg total) into the skin daily.   MEGESTROL (MEGACE) 40 MG TABLET    Take 1 tablet (40 mg total) by mouth daily.   METHOCARBAMOL (ROBAXIN) 500 MG TABLET    Take 1 tablet (500 mg total) by mouth every 6 (six) hours as needed for muscle spasms.   METOCLOPRAMIDE (REGLAN) 5 MG TABLET    Take 1 tablet (5 mg total) by mouth 2 (two) times daily with breakfast and lunch.   OXYCODONE-ACETAMINOPHEN (PERCOCET/ROXICET) 5-325 MG TABLET    Take 1 tablet by mouth every 6 (six) hours as needed.   SODIUM BICARBONATE 650 MG TABLET    Take 2 tablets (1,300 mg total) by mouth 2 (two) times daily.  Modified Medications  No medications on file  Discontinued Medications   JULUCA 50-25 MG TABS    TAKE 1 TABLET BY MOUTH DAILY. PLEASE TAKE WITH LARGEST MEAL OF THE DAY.    Allergies: No Known Allergies  Past Medical History: Past Medical History:  Diagnosis Date  . Below-knee amputation of left lower extremity ([place])   . CKD (chronic kidney disease) stage 5, GFR less than 15 ml/min ([facility]) 06/19/2016   06/30/16: Seen by Dr. Posey Pronto [Nephrology]. Suspect etiologies to include diabetes and hypertension though cannot exclude HIV-associated nephropathy. Advised her against from using excessive Goody powder.  . Contact dermatitis 04/21/2016   07/29/16: Skin biopsy performed by [facility] notable for acute  spongiotic dermatitis consistent with contact dermatitis.  . Cyclical vomiting    THC induced???  . Depression 06/28/2006   Qualifier: Diagnosis of  By: Riccardo Dubin MD, Todd    . Diabetes mellitus type 2 in obese ([place]) 06/28/1994  . DKA (diabetic ketoacidoses) ([place]) 12/2014  . Erosive esophagitis   . Esophageal reflux   . Essential hypertension 11/16/2013  . Gastroparesis    ? diabetic  . GERD (gastroesophageal reflux disease)   . History of blood transfusion   . Human immunodeficiency virus (HIV) disease ([place]) 04/23/2016  . Hypertension   . Moderate nonproliferative diabetic retinopathy of both eyes ([place]) 11/21/2014   11/14/14: Noted on retinal imaging; needs follow-up imaging in 6 months  05/22/16: Noted on retinal imaging again; needs follow-up imaging in 6 months  . Normocytic anemia   . TOBACCO USER 06/28/2006   Qualifier: Diagnosis of  By: Riccardo Dubin MD, Sherren Mocha    . Type II diabetes mellitus ([place]) 1994   diagnosed around 74  . Wound dehiscence   . Wound infection s/p L transmetatarsal amputation     Social History: Social History   Socioeconomic History  . Marital status: Single    Spouse name: Not on file  . Number of children: Not on file  . Years of education: 19  . Highest education level: Not on file  Occupational History    Employer: UNEMPLOYED  Social Needs  . Financial resource strain: Not on file  . Food insecurity    Worry: Not on file    Inability: Not on file  . Transportation needs    Medical: Not on file    Non-medical: Not on file  Tobacco Use  . Smoking status: Former Smoker    Packs/day: 0.10    Years: 10.00    Pack years: 1.00    Types: Cigarettes    Start date: 11/17/2013    Quit date: 09/09/2014    Years since quitting: 4.4  . Smokeless tobacco: Never Used  Substance and Sexual Activity  . Alcohol use: Yes    Comment: Occasionally.  . Drug use: Yes    Frequency: 4.0 times per week    Types: Marijuana  . Sexual activity: Not on file   Lifestyle  . Physical activity    Days per week: Not on file    Minutes per session: Not on file  . Stress: Not on file  Relationships  . Social Herbalist on phone: Not on file    Gets together: Not on file    Attends religious service: Not on file    Active member of club or organization: Not on file    Attends meetings of clubs or organizations: Not on file    Relationship status: Not on file  Other Topics Concern  .  Not on file  Social History Narrative  . Not on file    Labs: Lab Results  Component Value Date   HIV1RNAQUANT <20 10/18/2018   HIV1RNAQUANT <20 09/13/2017   HIV1RNAQUANT <20 NOT DETECTED 10/07/2016   HIV1RNAVL 80 12/23/2017   HIV1RNAVL 80 02/02/2017   CD4TABS 526 02/23/2019   CD4TABS 700 10/29/2018   CD4TABS 450 12/23/2017    RPR and STI Lab Results  Component Value Date   LABRPR Non Reactive 02/02/2017   LABRPR NON REAC 10/07/2016   LABRPR Non Reactive 04/23/2016    STI Results GC CT  10/07/2016 Negative Negative  04/23/2016 Negative Negative    Hepatitis B Lab Results  Component Value Date   HEPBSAG Negative 10/09/2018   Hepatitis C No results found for: HEPCAB, HCVRNAPCRQN Hepatitis A No results found for: HAV Lipids: Lab Results  Component Value Date   CHOL 182 10/07/2016   TRIG 76 10/07/2016   HDL 54 10/07/2016   CHOLHDL 3.4 10/07/2016   VLDL 15 10/07/2016   LDLCALC 113 (H) 10/07/2016    Current HIV Regimen: Juluca  Assessment: [name] is here today to follow-up for her HIV infection.  She is currently taking Juluca with no issues or tolerability problems. She often does not take it with a full meal as she has trouble eating sometimes. She is worried that the medication is not being absorbed fully.  She was switched to [place] because of her declining kidney function and the need to stay away from any regimen containing TDF or TAF.  There is a growing amount of evidence on the use of TAF in kidney disease and even ESRD, so  we will switch her to that as I do worry that she could develop resistance or not get complete viral suppression if she is taking Juluca without a full meal.  Explained that Phillips Odor is a one pill once daily medication with or without food and the importance of not missing any doses. Explained resistance and how it develops and why it is so important to take Biktarvy daily and not skip days or doses. Counseled patient to take it around the same time each day. Counseled on what to do if dose is missed, if closer to missed dose take immediately, if closer to next dose then skip and resume normal schedule.   Cautioned on possible side effects the first week or so including nausea, diarrhea, dizziness, and headaches but that they should resolve after the first couple of weeks. I reviewed patient medications and found no drug interactions. Counseled patient to separate Biktarvy from divalent cations including multivitamins. Discussed with patient to call clinic if she starts a new medication or herbal supplement. I gave the patient my card and told her to call me with any issues/questions/concerns.  Plan: - Stop Juluca - Start Biktarvy PO once daily - [facility] will mail it to her house  Roberts Bon L. Linsey Hirota, PharmD, BCIDP, AAHIVP, [facility] 02/24/2019, 12:10 PM

## 2019-02-27 ENCOUNTER — Ambulatory Visit (HOSPITAL_COMMUNITY)
Admission: RE | Admit: 2019-02-27 | Discharge: 2019-02-27 | Disposition: A | Discharge status: Home / Self Care | Payer: Medicaid Other | Source: Ambulatory Visit | Attending: Nephrology | Admitting: Nephrology

## 2019-02-27 ENCOUNTER — Other Ambulatory Visit: Payer: Self-pay

## 2019-02-27 VITALS — BP 144/78 | HR 75 | Resp 18

## 2019-02-27 DIAGNOSIS — N185 Chronic kidney disease, stage 5: Secondary | ICD-10-CM | POA: Insufficient documentation

## 2019-02-27 LAB — POCT HEMOGLOBIN-HEMACUE: Hemoglobin: 8.9 g/dL — ABNORMAL LOW (ref 12.0–15.0)

## 2019-02-27 MED ORDER — EPOETIN ALFA 10000 UNIT/ML IJ SOLN
INTRAMUSCULAR | Status: AC
  Administered 2019-02-27: 10000 [IU] via SUBCUTANEOUS
  Filled 2019-02-27: qty 1

## 2019-02-27 MED ORDER — EPOETIN ALFA 10000 UNIT/ML IJ SOLN
10000.0000 [IU] | INTRAMUSCULAR | Status: DC
  Administered 2019-02-27: 11:00:00 10000 [IU] via SUBCUTANEOUS

## 2019-02-27 MED FILL — BIKTARVY 50-200-25 MG TABS: 50-200-25 | 30 days supply | Qty: 30 | Fill #0

## 2019-03-01 LAB — HIV-1 RNA QUANT-NO REFLEX-BLD
HIV 1 RNA Quant: <20 copies/mL
HIV-1 RNA Quant, Log: <1.3 Log copies/mL

## 2019-03-06 ENCOUNTER — Encounter (HOSPITAL_COMMUNITY): Payer: Medicaid Other

## 2019-03-13 ENCOUNTER — Encounter (HOSPITAL_COMMUNITY): Payer: Medicaid Other

## 2019-03-20 ENCOUNTER — Ambulatory Visit (INDEPENDENT_AMBULATORY_CARE_PROVIDER_SITE_OTHER): Payer: Medicaid Other | Admitting: Internal Medicine

## 2019-03-20 ENCOUNTER — Encounter (HOSPITAL_COMMUNITY)
Admission: RE | Admit: 2019-03-20 | Discharge: 2019-03-20 | Disposition: A | Discharge status: Home / Self Care | Payer: Medicaid Other | Source: Ambulatory Visit | Attending: Nephrology | Admitting: Nephrology

## 2019-03-20 ENCOUNTER — Other Ambulatory Visit: Payer: Self-pay

## 2019-03-20 VITALS — BP 136/69 | HR 97 | Temp 98.1°F | Ht 65.0 in | Wt 150.0 lb

## 2019-03-20 VITALS — BP 158/81 | HR 78 | Temp 97.8°F | Resp 18

## 2019-03-20 DIAGNOSIS — F32A Depression, unspecified: Secondary | ICD-10-CM

## 2019-03-20 DIAGNOSIS — Z79899 Other long term (current) drug therapy: Secondary | ICD-10-CM

## 2019-03-20 DIAGNOSIS — F332 Major depressive disorder, recurrent severe without psychotic features: Secondary | ICD-10-CM

## 2019-03-20 DIAGNOSIS — D638 Anemia in other chronic diseases classified elsewhere: Secondary | ICD-10-CM

## 2019-03-20 DIAGNOSIS — E1169 Type 2 diabetes mellitus with other specified complication: Secondary | ICD-10-CM

## 2019-03-20 DIAGNOSIS — Z915 Personal history of self-harm: Secondary | ICD-10-CM

## 2019-03-20 DIAGNOSIS — N185 Chronic kidney disease, stage 5: Secondary | ICD-10-CM | POA: Insufficient documentation

## 2019-03-20 DIAGNOSIS — F329 Major depressive disorder, single episode, unspecified: Secondary | ICD-10-CM

## 2019-03-20 DIAGNOSIS — Z89512 Acquired absence of left leg below knee: Secondary | ICD-10-CM

## 2019-03-20 DIAGNOSIS — Z6824 Body mass index (BMI) 24.0-24.9, adult: Secondary | ICD-10-CM

## 2019-03-20 DIAGNOSIS — E669 Obesity, unspecified: Secondary | ICD-10-CM | POA: Diagnosis not present

## 2019-03-20 DIAGNOSIS — I10 Essential (primary) hypertension: Secondary | ICD-10-CM

## 2019-03-20 DIAGNOSIS — T8789 Other complications of amputation stump: Secondary | ICD-10-CM

## 2019-03-20 LAB — CBC WITH DIFFERENTIAL/PLATELET
Abs Immature Granulocytes: 0.02 10*3/uL (ref 0.00–0.07)
Basophils Absolute: 0 10*3/uL (ref 0.0–0.1)
Basophils Relative: 1 %
Eosinophils Absolute: 0.4 10*3/uL (ref 0.0–0.5)
Eosinophils Relative: 7 %
HCT: 25.5 % — ABNORMAL LOW (ref 36.0–46.0)
Hemoglobin: 7.7 g/dL — ABNORMAL LOW (ref 12.0–15.0)
Immature Granulocytes: 0 %
Lymphocytes Relative: 26 %
Lymphs Abs: 1.5 10*3/uL (ref 0.7–4.0)
MCH: 29.8 pg (ref 26.0–34.0)
MCHC: 30.2 g/dL (ref 30.0–36.0)
MCV: 98.8 fL (ref 80.0–100.0)
Monocytes Absolute: 0.5 10*3/uL (ref 0.1–1.0)
Monocytes Relative: 9 %
Neutro Abs: 3.2 10*3/uL (ref 1.7–7.7)
Neutrophils Relative %: 57 %
Platelets: 336 10*3/uL (ref 150–400)
RBC: 2.58 MIL/uL — ABNORMAL LOW (ref 3.87–5.11)
RDW: 18 % — ABNORMAL HIGH (ref 11.5–15.5)
WBC: 5.6 10*3/uL (ref 4.0–10.5)
nRBC: 0 % (ref 0.0–0.2)

## 2019-03-20 LAB — RENAL FUNCTION PANEL
Albumin: 3.1 g/dL — ABNORMAL LOW (ref 3.5–5.0)
Anion gap: 12 (ref 5–15)
BUN: 45 mg/dL — ABNORMAL HIGH (ref 6–20)
CO2: 11 mmol/L — ABNORMAL LOW (ref 22–32)
Calcium: 7.6 mg/dL — ABNORMAL LOW (ref 8.9–10.3)
Chloride: 115 mmol/L — ABNORMAL HIGH (ref 98–111)
Creatinine, Ser: 8.36 mg/dL — ABNORMAL HIGH (ref 0.44–1.00)
GFR calc Af Amer: 6 mL/min — ABNORMAL LOW (ref >60)
GFR calc non Af Amer: 5 mL/min — ABNORMAL LOW (ref >60)
Glucose, Bld: 120 mg/dL — ABNORMAL HIGH (ref 70–99)
Phosphorus: 5.5 mg/dL — ABNORMAL HIGH (ref 2.5–4.6)
Potassium: 3.9 mmol/L (ref 3.5–5.1)
Sodium: 138 mmol/L (ref 135–145)

## 2019-03-20 LAB — MAGNESIUM: Magnesium: 1.9 mg/dL (ref 1.7–2.4)

## 2019-03-20 LAB — POCT HEMOGLOBIN-HEMACUE: Hemoglobin: 7.8 g/dL — ABNORMAL LOW (ref 12.0–15.0)

## 2019-03-20 LAB — IRON AND TIBC
Iron: 57 ug/dL (ref 28–170)
Saturation Ratios: 26 % (ref 10.4–31.8)
TIBC: 220 ug/dL — ABNORMAL LOW (ref 250–450)
UIBC: 163 ug/dL

## 2019-03-20 LAB — FERRITIN: Ferritin: 127 ng/mL (ref 11–307)

## 2019-03-20 MED ORDER — HYDRALAZINE HCL 25 MG PO TABS: 25.0000 mg | ORAL_TABLET | Freq: Three times a day (TID) | ORAL | 2 refills | Status: DC

## 2019-03-20 MED ORDER — CITALOPRAM HYDROBROMIDE 20 MG PO TABS: ORAL_TABLET | ORAL | 0 refills | Status: DC

## 2019-03-20 MED ORDER — EPOETIN ALFA 10000 UNIT/ML IJ SOLN
10000.0000 [IU] | INTRAMUSCULAR | Status: DC
  Administered 2019-03-20: 10000 [IU] via SUBCUTANEOUS

## 2019-03-20 MED ORDER — EPOETIN ALFA 10000 UNIT/ML IJ SOLN
INTRAMUSCULAR | Status: AC
  Filled 2019-03-20: qty 1

## 2019-03-20 NOTE — Patient Instructions (Addendum)
Today's Visit:   1. Depression - sent over referral to [facility] for counseling and setting up Psychiatry appointment. I increased your Citalopram to 40 mg a day. So take two 20 mg tablets each day (40 mg total).  2. Hypertension - prescribed hydralazine 25 mg every    Thank you,  Marianna Payment

## 2019-03-21 ENCOUNTER — Encounter: Payer: Medicaid Other | Admitting: Internal Medicine

## 2019-03-21 ENCOUNTER — Other Ambulatory Visit: Payer: Self-pay | Admitting: Internal Medicine

## 2019-03-21 DIAGNOSIS — F329 Major depressive disorder, single episode, unspecified: Secondary | ICD-10-CM

## 2019-03-21 DIAGNOSIS — F32A Depression, unspecified: Secondary | ICD-10-CM

## 2019-03-21 LAB — MICROALBUMIN / CREATININE URINE RATIO
Creatinine, Urine: 79 mg/dL
Microalb/Creat Ratio: 1809 mg/g creat — ABNORMAL HIGH (ref 0–29)
Microalbumin, Urine: 1429.1 ug/mL

## 2019-03-21 NOTE — Assessment & Plan Note (Signed)
Patient states that she has had depression for several years has tried many medications without success.  She admits to 2 prior suicide attempts and endorses thoughts of suicide but states that she does not want to commit suicide.  She has no plan and states that she has no means to commit suicide including no weapons at her house.  She is currently taking citalopram 20 mg.  Considering the patient has severe depression that is resistant to treatment, I believe it is prudent to refer her to outpatient psychiatry and counseling.  Plan: - I have sent a message to [facility] to set up a psychiatry referral and counseling. - I will increase her citalopram to 40 mg daily

## 2019-03-21 NOTE — Assessment & Plan Note (Signed)
Above the knee amputation with residual neuropathic pain around the stump.  The surgical surgical line is well-healed.  Patient recently ran out of opioid pain medication and is currently taking the max dose of gabapentin at 300 mg.  Based on her current kidney function it would be difficult to use NSAIDs or increase her dose of gabapentin.  Patient may benefit from switching to pregabalin or starting topical NSAID cream.

## 2019-03-21 NOTE — Assessment & Plan Note (Signed)
Patient's diabetes is poorly controlled with multiple blood glucose levels of 150 or greater in the past several months.  Post recent hemoglobin A1c was 6.2 but in the setting of anemia of chronic disease it is unlikely that this is a true representation of her treatment efficacy.  Patient will need to be counseled on intensive blood glucose monitoring.

## 2019-03-21 NOTE — Assessment & Plan Note (Signed)
Patient taking amlodipine 10 mg and labetalol 300 mg and continues to have blood pressures that are significantly elevated.  Patient is already maxed out on her current blood pressure medications.  For this reason I believe it would be pertinent to start a new medication today.   Plan: -Hydralazine 25 mg p.o. 3 times daily

## 2019-03-21 NOTE — Progress Notes (Signed)
   CC: Depression  HPI:  Kathy Frank is a 43 y.o. female with a past medical history stated below and presented today for evaluation for depression. Please see problem based assessment and plan for additional details.   Past Medical History:  Diagnosis Date  . Below-knee amputation of left lower extremity ([place])   . CKD (chronic kidney disease) stage 5, GFR less than 15 ml/min ([facility]) 06/19/2016   06/30/16: Seen by Dr. Posey Pronto [Nephrology]. Suspect etiologies to include diabetes and hypertension though cannot exclude HIV-associated nephropathy. Advised her against from using excessive Goody powder.  . Contact dermatitis 04/21/2016   07/29/16: Skin biopsy performed by [facility] for acute spongiotic dermatitis consistent with contact dermatitis.  . Cyclical vomiting    THC induced???  . Depression 06/28/2006   Qualifier: Diagnosis of  By: Riccardo Dubin MD, Todd    . Diabetes mellitus type 2 in obese ([place]) 06/28/1994  . DKA (diabetic ketoacidoses) ([place]) 12/2014  . Erosive esophagitis   . Esophageal reflux   . Essential hypertension 11/16/2013  . Gastroparesis    ? diabetic  . GERD (gastroesophageal reflux disease)   . History of blood transfusion   . Human immunodeficiency virus (HIV) disease ([place]) 04/23/2016  . Hypertension   . Moderate nonproliferative diabetic retinopathy of both eyes ([place]) 11/21/2014   11/14/14: Noted on retinal imaging; needs follow-up imaging in 6 months  05/22/16: Noted on retinal imaging again; needs follow-up imaging in 6 months  . Normocytic anemia   . TOBACCO USER 06/28/2006   Qualifier: Diagnosis of  By: Riccardo Dubin MD, Sherren Mocha    . Type II diabetes mellitus ([place]) 1994   diagnosed around 63  . Wound dehiscence   . Wound infection s/p L transmetatarsal amputation      Review of Systems: Review of Systems  Constitutional: Negative for chills and fever.  Eyes: Negative for blurred vision and double vision.  Respiratory: Negative for cough  and shortness of breath.   Cardiovascular: Negative for chest pain and palpitations.  Musculoskeletal:       Admits to pain at her stump on her left leg.  Neurological: Negative for dizziness and headaches.     Vitals:   03/20/19 1330  BP: 136/69  Pulse: 97  Temp: 98.1 F (36.7 C)  TempSrc: Oral  SpO2: 100%  Weight: 150 lb (68 kg)  Height: 5\' 5"  (1.651 m)     Physical Exam: Physical Exam  Constitutional: She is oriented to person, place, and time. No distress.  Cardiovascular: Normal rate, regular rhythm, normal heart sounds and intact distal pulses. Exam reveals no gallop and no friction rub.  No murmur heard. Pulmonary/Chest: Effort normal and breath sounds normal. No respiratory distress. She exhibits no tenderness.  Abdominal: Soft. There is no abdominal tenderness.  Neurological: She is alert and oriented to person, place, and time.  Psychiatric: Memory, affect and judgment normal. Her affect is not inappropriate. She exhibits a depressed mood. She expresses no homicidal and no suicidal ideation. She expresses no suicidal plans and no homicidal plans.     Assessment & Plan:   See Encounters Tab for problem based charting.  Patient seen with Dr. Daryll Drown

## 2019-03-23 ENCOUNTER — Ambulatory Visit: Payer: Medicaid Other | Admitting: Orthopedic Surgery

## 2019-03-24 ENCOUNTER — Telehealth: Payer: Self-pay | Admitting: Internal Medicine

## 2019-03-24 NOTE — Telephone Encounter (Signed)
Pls call pt back regarding medicine [phone]

## 2019-03-24 NOTE — Progress Notes (Signed)
Internal Medicine Clinic Attending  I saw and evaluated the patient.  I personally confirmed the key portions of the history and exam documented by Dr. Coe and I reviewed pertinent patient test results.  The assessment, diagnosis, and plan were formulated together and I agree with the documentation in the resident's note.    

## 2019-03-24 NOTE — Telephone Encounter (Signed)
Pt called to see where her meds went, she does not use [facility] anymore, uses [facility] because meds are free

## 2019-03-27 ENCOUNTER — Encounter (HOSPITAL_COMMUNITY): Payer: Medicaid Other

## 2019-03-28 MED FILL — BIKTARVY 50-200-25 MG TABS: 50-200-25 | 30 days supply | Qty: 30 | Fill #1

## 2019-03-29 ENCOUNTER — Telehealth: Payer: Self-pay | Admitting: Licensed Clinical Social Worker

## 2019-03-29 ENCOUNTER — Encounter: Payer: Self-pay | Admitting: Licensed Clinical Social Worker

## 2019-03-29 NOTE — Telephone Encounter (Signed)
Patient was contacted due to a referral from her doctor. Patient did not answer, and a vm was left for the pt to call our office back to set up her first appointment. A letter will also be mailed today.

## 2019-04-03 ENCOUNTER — Other Ambulatory Visit: Payer: Self-pay

## 2019-04-03 ENCOUNTER — Ambulatory Visit (HOSPITAL_COMMUNITY)
Admission: RE | Admit: 2019-04-03 | Discharge: 2019-04-03 | Disposition: A | Discharge status: Home / Self Care | Payer: Medicaid Other | Source: Ambulatory Visit | Attending: Nephrology | Admitting: Nephrology

## 2019-04-03 ENCOUNTER — Ambulatory Visit: Payer: Medicaid Other

## 2019-04-03 VITALS — BP 139/67 | HR 75 | Temp 97.6°F | Resp 18

## 2019-04-03 DIAGNOSIS — N185 Chronic kidney disease, stage 5: Secondary | ICD-10-CM | POA: Diagnosis present

## 2019-04-03 LAB — POCT HEMOGLOBIN-HEMACUE: Hemoglobin: 7.3 g/dL — ABNORMAL LOW (ref 12.0–15.0)

## 2019-04-03 MED ORDER — EPOETIN ALFA 20000 UNIT/ML IJ SOLN
INTRAMUSCULAR | Status: AC
  Administered 2019-04-03: 20000 [IU] via SUBCUTANEOUS
  Filled 2019-04-03: qty 1

## 2019-04-03 MED ORDER — EPOETIN ALFA 10000 UNIT/ML IJ SOLN
INTRAMUSCULAR | Status: AC
  Filled 2019-04-03: qty 1

## 2019-04-03 MED ORDER — EPOETIN ALFA 10000 UNIT/ML IJ SOLN: 10000.0000 [IU] | INTRAMUSCULAR | Status: DC

## 2019-04-03 NOTE — Progress Notes (Signed)
Pt here today for injection and she had her minor daughter with her, who looked like of teenage years. The daughter walked to the ice machine and got ice and water in her personal cup and a staff member asked her next time to ask for assistance if she would like something to drink and she nodded her head in agreement.  I asked the patient if her daughter was under 75 and she stated yes, then I noticed her daughter had her mask down below her mouth and I asked her to please raise it up to cover her nose.  Her daughter did not respond to me and again I asked her to raise her mask to cover her nose so she did and she left it there the remainder of her mother's appt.  I spoke with the patient and told her that as a reminder the hospital is still under a visitor restriction in our particular area. The patient said well that's my daughter, she is a minor, and she had an appointment after this somewhere else, and appeared agitated.  I said I understand that and would allow it this time, however for future reference she would need to make other arrangements, or have her wait in the lobby or car for her because next time we would not allow her to be in the department in order to protect other patients and staff from unnecessary exposure.  The patient did not say anything else.

## 2019-04-07 ENCOUNTER — Other Ambulatory Visit (HOSPITAL_COMMUNITY): Payer: Self-pay | Admitting: *Deleted

## 2019-04-10 ENCOUNTER — Other Ambulatory Visit: Payer: Self-pay

## 2019-04-10 ENCOUNTER — Ambulatory Visit (HOSPITAL_COMMUNITY)
Admission: RE | Admit: 2019-04-10 | Discharge: 2019-04-10 | Disposition: A | Discharge status: Home / Self Care | Payer: Medicaid Other | Source: Ambulatory Visit | Attending: Nephrology | Admitting: Nephrology

## 2019-04-10 VITALS — BP 174/87 | HR 77 | Temp 97.2°F | Resp 18

## 2019-04-10 DIAGNOSIS — N185 Chronic kidney disease, stage 5: Secondary | ICD-10-CM | POA: Diagnosis present

## 2019-04-10 LAB — POCT HEMOGLOBIN-HEMACUE: Hemoglobin: 8.3 g/dL — ABNORMAL LOW (ref 12.0–15.0)

## 2019-04-10 MED ORDER — EPOETIN ALFA 20000 UNIT/ML IJ SOLN
INTRAMUSCULAR | Status: AC
  Administered 2019-04-10: 20000 [IU] via SUBCUTANEOUS
  Filled 2019-04-10: qty 1

## 2019-04-10 MED ORDER — EPOETIN ALFA 20000 UNIT/ML IJ SOLN
20000.0000 [IU] | INTRAMUSCULAR | Status: DC
  Administered 2019-04-10: 10:00:00 20000 [IU] via SUBCUTANEOUS

## 2019-04-12 ENCOUNTER — Other Ambulatory Visit: Payer: Self-pay | Admitting: *Deleted

## 2019-04-13 ENCOUNTER — Other Ambulatory Visit: Payer: Self-pay

## 2019-04-13 ENCOUNTER — Encounter (HOSPITAL_COMMUNITY): Payer: Self-pay | Admitting: *Deleted

## 2019-04-13 NOTE — Progress Notes (Signed)
Pt denies SOB, chest pain, and being under the care of a cardiologist. Pt denies having a stress test and cardiac cath. Pt made aware to stop taking vitamins, fish oil and herbal medications. Do not take any NSAIDs ie: Ibuprofen, Advil, Naproxen (Aleve), Motrin, BC and Goody Powder.Pt made aware to take 2 units of Lantus insulin tonight. Pt made aware to check CBG every 2 hours prior to arrival to hospital on DOS. Pt made aware to treat a CBG < 70 with  4 ounces of apple or cranberry juice, wait 15 minutes after intervention to recheck BG, if BG remains < 70, call Short Stay unit to speak with a nurse. Pt made aware to not take Victoza on DOS. Pt verbalized understanding of all pre-op instructions.

## 2019-04-14 ENCOUNTER — Ambulatory Visit (HOSPITAL_COMMUNITY): Payer: Medicaid Other | Admitting: Anesthesiology

## 2019-04-14 ENCOUNTER — Ambulatory Visit (HOSPITAL_COMMUNITY)
Admission: RE | Admit: 2019-04-14 | Discharge: 2019-04-14 | Disposition: A | Discharge status: Home / Self Care | Payer: Medicaid Other | Attending: Vascular Surgery | Admitting: Vascular Surgery

## 2019-04-14 ENCOUNTER — Other Ambulatory Visit: Payer: Self-pay

## 2019-04-14 ENCOUNTER — Encounter (HOSPITAL_COMMUNITY): Payer: Self-pay

## 2019-04-14 ENCOUNTER — Ambulatory Visit (HOSPITAL_COMMUNITY): Payer: Medicaid Other

## 2019-04-14 ENCOUNTER — Encounter (HOSPITAL_COMMUNITY)
Admission: RE | Disposition: A | Discharge status: Home / Self Care | Payer: Self-pay | Source: Home / Self Care | Attending: Vascular Surgery

## 2019-04-14 DIAGNOSIS — Z794 Long term (current) use of insulin: Secondary | ICD-10-CM | POA: Diagnosis not present

## 2019-04-14 DIAGNOSIS — Z89612 Acquired absence of left leg above knee: Secondary | ICD-10-CM

## 2019-04-14 DIAGNOSIS — Z89512 Acquired absence of left leg below knee: Secondary | ICD-10-CM | POA: Insufficient documentation

## 2019-04-14 DIAGNOSIS — E113293 Type 2 diabetes mellitus with mild nonproliferative diabetic retinopathy without macular edema, bilateral: Secondary | ICD-10-CM | POA: Diagnosis not present

## 2019-04-14 DIAGNOSIS — K219 Gastro-esophageal reflux disease without esophagitis: Secondary | ICD-10-CM | POA: Diagnosis not present

## 2019-04-14 DIAGNOSIS — Z20828 Contact with and (suspected) exposure to other viral communicable diseases: Secondary | ICD-10-CM | POA: Diagnosis not present

## 2019-04-14 DIAGNOSIS — I120 Hypertensive chronic kidney disease with stage 5 chronic kidney disease or end stage renal disease: Secondary | ICD-10-CM | POA: Diagnosis present

## 2019-04-14 DIAGNOSIS — J449 Chronic obstructive pulmonary disease, unspecified: Secondary | ICD-10-CM | POA: Insufficient documentation

## 2019-04-14 DIAGNOSIS — Z87891 Personal history of nicotine dependence: Secondary | ICD-10-CM | POA: Diagnosis not present

## 2019-04-14 DIAGNOSIS — E1122 Type 2 diabetes mellitus with diabetic chronic kidney disease: Secondary | ICD-10-CM | POA: Insufficient documentation

## 2019-04-14 DIAGNOSIS — Z79899 Other long term (current) drug therapy: Secondary | ICD-10-CM | POA: Diagnosis not present

## 2019-04-14 DIAGNOSIS — Z833 Family history of diabetes mellitus: Secondary | ICD-10-CM | POA: Diagnosis not present

## 2019-04-14 DIAGNOSIS — B20 Human immunodeficiency virus [HIV] disease: Secondary | ICD-10-CM | POA: Insufficient documentation

## 2019-04-14 DIAGNOSIS — N185 Chronic kidney disease, stage 5: Secondary | ICD-10-CM

## 2019-04-14 DIAGNOSIS — N186 End stage renal disease: Secondary | ICD-10-CM | POA: Insufficient documentation

## 2019-04-14 DIAGNOSIS — Z992 Dependence on renal dialysis: Secondary | ICD-10-CM

## 2019-04-14 HISTORY — DX: Dyspnea, unspecified: R06.00

## 2019-04-14 HISTORY — PX: BASCILIC VEIN TRANSPOSITION: SHX5742

## 2019-04-14 HISTORY — PX: INSERTION OF DIALYSIS CATHETER: SHX1324

## 2019-04-14 LAB — POCT I-STAT 4, (NA,K, GLUC, HGB,HCT)
Glucose, Bld: 95 mg/dL (ref 70–99)
HCT: 24 % — ABNORMAL LOW (ref 36.0–46.0)
Hemoglobin: 8.2 g/dL — ABNORMAL LOW (ref 12.0–15.0)
Potassium: 4 mmol/L (ref 3.5–5.1)
Sodium: 142 mmol/L (ref 135–145)

## 2019-04-14 LAB — GLUCOSE, CAPILLARY
Glucose-Capillary: 99 mg/dL (ref 70–99)
Glucose-Capillary: 99 mg/dL (ref 70–99)
Glucose-Capillary: 133 mg/dL — ABNORMAL HIGH (ref 70–99)

## 2019-04-14 LAB — SARS CORONAVIRUS 2 BY RT PCR (HOSPITAL ORDER, PERFORMED IN ~~LOC~~ HOSPITAL LAB): SARS Coronavirus 2: NEGATIVE

## 2019-04-14 LAB — HCG, SERUM, QUALITATIVE: Preg, Serum: NEGATIVE

## 2019-04-14 SURGERY — TRANSPOSITION, VEIN, BASILIC
Anesthesia: Monitor Anesthesia Care | Site: Neck | Laterality: Right

## 2019-04-14 MED ORDER — LIDOCAINE-EPINEPHRINE 0.5 %-1:200000 IJ SOLN
INTRAMUSCULAR | Status: DC | PRN
  Administered 2019-04-14: 30 mL

## 2019-04-14 MED ORDER — SODIUM CHLORIDE 0.9 % IV SOLN
INTRAVENOUS | Status: DC
  Administered 2019-04-14: 12:00:00 via INTRAVENOUS

## 2019-04-14 MED ORDER — PROPOFOL 500 MG/50ML IV EMUL
INTRAVENOUS | Status: DC | PRN
  Administered 2019-04-14: 50 ug/kg/min via INTRAVENOUS

## 2019-04-14 MED ORDER — SODIUM CHLORIDE 0.9 % IV SOLN
INTRAVENOUS | Status: AC
  Filled 2019-04-14: qty 1.2

## 2019-04-14 MED ORDER — LIDOCAINE 2% (20 MG/ML) 5 ML SYRINGE
INTRAMUSCULAR | Status: AC
  Filled 2019-04-14: qty 5

## 2019-04-14 MED ORDER — OXYCODONE-ACETAMINOPHEN 5-325 MG PO TABS
1.0000 | ORAL_TABLET | Freq: Once | ORAL | Status: AC
  Administered 2019-04-14: 18:00:00 1 via ORAL

## 2019-04-14 MED ORDER — DEXAMETHASONE SODIUM PHOSPHATE 10 MG/ML IJ SOLN
INTRAMUSCULAR | Status: DC | PRN
  Administered 2019-04-14: 10 mg via INTRAVENOUS

## 2019-04-14 MED ORDER — FENTANYL CITRATE (PF) 100 MCG/2ML IJ SOLN
25.0000 ug | INTRAMUSCULAR | Status: DC | PRN
  Administered 2019-04-14: 18:00:00 25 ug via INTRAVENOUS

## 2019-04-14 MED ORDER — OXYCODONE-ACETAMINOPHEN 5-325 MG PO TABS: 1.0000 | ORAL_TABLET | Freq: Four times a day (QID) | ORAL | 0 refills | Status: DC | PRN

## 2019-04-14 MED ORDER — MEPERIDINE HCL 25 MG/ML IJ SOLN: 6.2500 mg | INTRAMUSCULAR | Status: DC | PRN

## 2019-04-14 MED ORDER — CEFAZOLIN SODIUM-DEXTROSE 2-4 GM/100ML-% IV SOLN
2.0000 g | INTRAVENOUS | Status: AC
  Administered 2019-04-14: 2 g via INTRAVENOUS

## 2019-04-14 MED ORDER — CEFAZOLIN SODIUM-DEXTROSE 2-4 GM/100ML-% IV SOLN
INTRAVENOUS | Status: AC
  Filled 2019-04-14: qty 100

## 2019-04-14 MED ORDER — PROMETHAZINE HCL 25 MG/ML IJ SOLN: 6.2500 mg | INTRAMUSCULAR | Status: DC | PRN

## 2019-04-14 MED ORDER — FENTANYL CITRATE (PF) 250 MCG/5ML IJ SOLN
INTRAMUSCULAR | Status: AC
  Filled 2019-04-14: qty 5

## 2019-04-14 MED ORDER — FENTANYL CITRATE (PF) 100 MCG/2ML IJ SOLN
INTRAMUSCULAR | Status: DC | PRN
  Administered 2019-04-14: 25 ug via INTRAVENOUS
  Administered 2019-04-14: 50 ug via INTRAVENOUS
  Administered 2019-04-14: 25 ug via INTRAVENOUS

## 2019-04-14 MED ORDER — FENTANYL CITRATE (PF) 100 MCG/2ML IJ SOLN
INTRAMUSCULAR | Status: AC
  Administered 2019-04-14: 25 ug via INTRAVENOUS
  Filled 2019-04-14: qty 2

## 2019-04-14 MED ORDER — MIDAZOLAM HCL 2 MG/2ML IJ SOLN: 0.5000 mg | Freq: Once | INTRAMUSCULAR | Status: DC | PRN

## 2019-04-14 MED ORDER — LIDOCAINE-EPINEPHRINE 0.5 %-1:200000 IJ SOLN
INTRAMUSCULAR | Status: AC
  Filled 2019-04-14: qty 1

## 2019-04-14 MED ORDER — HEPARIN SODIUM (PORCINE) 1000 UNIT/ML IJ SOLN
INTRAMUSCULAR | Status: AC
  Filled 2019-04-14: qty 1

## 2019-04-14 MED ORDER — ONDANSETRON HCL 4 MG/2ML IJ SOLN
INTRAMUSCULAR | Status: DC | PRN
  Administered 2019-04-14: 4 mg via INTRAVENOUS

## 2019-04-14 MED ORDER — OXYCODONE-ACETAMINOPHEN 5-325 MG PO TABS
ORAL_TABLET | ORAL | Status: AC
  Administered 2019-04-14: 1 via ORAL
  Filled 2019-04-14: qty 1

## 2019-04-14 MED ORDER — SODIUM CHLORIDE 0.9 % IV SOLN
INTRAVENOUS | Status: DC | PRN
  Administered 2019-04-14: 500 mL

## 2019-04-14 MED ORDER — HEPARIN SODIUM (PORCINE) 1000 UNIT/ML IJ SOLN
INTRAMUSCULAR | Status: DC | PRN
  Administered 2019-04-14: 3.4 [IU]

## 2019-04-14 SURGICAL SUPPLY — 66 items
ADH SKN CLS APL DERMABOND .7 (GAUZE/BANDAGES/DRESSINGS) ×4
ARMBAND PINK RESTRICT EXTREMIT (MISCELLANEOUS) ×3 IMPLANT
BAG DECANTER FOR FLEXI CONT (MISCELLANEOUS) ×2 IMPLANT
BIOPATCH RED 1 DISK 7.0 (GAUZE/BANDAGES/DRESSINGS) ×3 IMPLANT
CANISTER SUCT 3000ML PPV (MISCELLANEOUS) ×3 IMPLANT
CANNULA VESSEL 3MM 2 BLNT TIP (CANNULA) ×3 IMPLANT
CATH PALINDROME RT-P 15FX19CM (CATHETERS) IMPLANT
CATH PALINDROME RT-P 15FX23CM (CATHETERS) ×1 IMPLANT
CATH PALINDROME RT-P 15FX28CM (CATHETERS) IMPLANT
CATH PALINDROME RT-P 15FX55CM (CATHETERS) IMPLANT
CLIP LIGATING EXTRA MED SLVR (CLIP) ×3 IMPLANT
CLIP LIGATING EXTRA SM BLUE (MISCELLANEOUS) ×3 IMPLANT
COVER DOME SNAP 22 D (MISCELLANEOUS) ×1 IMPLANT
COVER PROBE W GEL 5X96 (DRAPES) ×2 IMPLANT
COVER SURGICAL LIGHT HANDLE (MISCELLANEOUS) ×3 IMPLANT
COVER WAND RF STERILE (DRAPES) ×2 IMPLANT
DECANTER SPIKE VIAL GLASS SM (MISCELLANEOUS) ×2 IMPLANT
DERMABOND ADVANCED (GAUZE/BANDAGES/DRESSINGS) ×2
DERMABOND ADVANCED .7 DNX12 (GAUZE/BANDAGES/DRESSINGS) ×2 IMPLANT
DRAPE C-ARM 42X72 X-RAY (DRAPES) ×2 IMPLANT
DRAPE CHEST BREAST 15X10 FENES (DRAPES) ×2 IMPLANT
DRAPE ORTHO SPLIT 87X125 STRL (DRAPES) ×1 IMPLANT
ELECT REM PT RETURN 9FT ADLT (ELECTROSURGICAL) ×3
ELECTRODE REM PT RTRN 9FT ADLT (ELECTROSURGICAL) ×2 IMPLANT
GAUZE 4X4 16PLY RFD (DISPOSABLE) ×3 IMPLANT
GLOVE BIO SURGEON STRL SZ7.5 (GLOVE) ×1 IMPLANT
GLOVE BIOGEL PI IND STRL 6.5 (GLOVE) IMPLANT
GLOVE BIOGEL PI IND STRL 7.0 (GLOVE) IMPLANT
GLOVE BIOGEL PI INDICATOR 6.5 (GLOVE) ×2
GLOVE BIOGEL PI INDICATOR 7.0 (GLOVE) ×1
GLOVE ECLIPSE 6.5 STRL STRAW (GLOVE) ×1 IMPLANT
GLOVE SS BIOGEL STRL SZ 7.5 (GLOVE) ×2 IMPLANT
GLOVE SUPERSENSE BIOGEL SZ 7.5 (GLOVE) ×1
GLOVE SURG SS PI 6.5 STRL IVOR (GLOVE) ×1 IMPLANT
GOWN STRL REUS W/ TWL LRG LVL3 (GOWN DISPOSABLE) ×6 IMPLANT
GOWN STRL REUS W/ TWL XL LVL3 (GOWN DISPOSABLE) IMPLANT
GOWN STRL REUS W/TWL LRG LVL3 (GOWN DISPOSABLE) ×9
GOWN STRL REUS W/TWL XL LVL3 (GOWN DISPOSABLE) ×6
KIT BASIN OR (CUSTOM PROCEDURE TRAY) ×3 IMPLANT
KIT TURNOVER KIT B (KITS) ×3 IMPLANT
NDL 18GX1X1/2 (RX/OR ONLY) (NEEDLE) ×2 IMPLANT
NDL HYPO 25GX1X1/2 BEV (NEEDLE) ×2 IMPLANT
NEEDLE 18GX1X1/2 (RX/OR ONLY) (NEEDLE) IMPLANT
NEEDLE 22X1 1/2 (OR ONLY) (NEEDLE) IMPLANT
NEEDLE HYPO 25GX1X1/2 BEV (NEEDLE) ×3 IMPLANT
NS IRRIG 1000ML POUR BTL (IV SOLUTION) ×3 IMPLANT
PACK CV ACCESS (CUSTOM PROCEDURE TRAY) ×3 IMPLANT
PACK SURGICAL SETUP 50X90 (CUSTOM PROCEDURE TRAY) ×3 IMPLANT
PAD ARMBOARD 7.5X6 YLW CONV (MISCELLANEOUS) ×6 IMPLANT
SOAP 2 % CHG 4 OZ (WOUND CARE) ×3 IMPLANT
SUT ETHILON 3 0 PS 1 (SUTURE) ×3 IMPLANT
SUT PROLENE 6 0 CC (SUTURE) ×3 IMPLANT
SUT SILK 2 0 SH (SUTURE) ×1 IMPLANT
SUT SILK 3 0 (SUTURE) ×3
SUT SILK 3-0 18XBRD TIE 12 (SUTURE) IMPLANT
SUT VIC AB 3-0 SH 27 (SUTURE) ×6
SUT VIC AB 3-0 SH 27X BRD (SUTURE) ×2 IMPLANT
SUT VICRYL 4-0 PS2 18IN ABS (SUTURE) ×4 IMPLANT
SYR 10ML LL (SYRINGE) ×3 IMPLANT
SYR 20ML LL LF (SYRINGE) ×3 IMPLANT
SYR 5ML LL (SYRINGE) ×6 IMPLANT
SYR CONTROL 10ML LL (SYRINGE) ×2 IMPLANT
TOWEL GREEN STERILE (TOWEL DISPOSABLE) ×6 IMPLANT
TOWEL GREEN STERILE FF (TOWEL DISPOSABLE) ×3 IMPLANT
UNDERPAD 30X30 (UNDERPADS AND DIAPERS) ×3 IMPLANT
WATER STERILE IRR 1000ML POUR (IV SOLUTION) ×3 IMPLANT

## 2019-04-14 NOTE — H&P (Signed)
Kathy Heck, MD    Kathy Heck, MD  Physician  Vascular Surgery     Consult Note  Addendum     Date of Service:  11/24/2018  3:31 PM               Expand All Collapse All            Expand widget buttonCollapse widget button    Show:Clear all   ManualTemplateCopied  Added by:     Kathy Heck, MD   Hover for detailscustomization button                                                                               [facility] Consult           Reason for Consult: Evaluate for left second stage brachiobasilic fistula  Referring Physician: Internal Medicine  MRN #:  [id number]     History of Present Illness: This is a 43 y.o. female with multiple medical problems including stage V chronic kidney disease, diabetes, HIV, hypertension, chronic anemia that vascular surgery has been asked to evaluate for left second stage brachiobasilic fistula.  Patient presented to the ED this evening with progression of her stage V chronic kidney disease and is being admitted for aggressive diuresis.  In discussing with medicine team there are no immediate plans for dialysis and she does not need a catheter.  They do anticipate she will likely need dialysis within the next month or so.  Her presenting complaint in the ED was worsening dyspnea with pulmonary edema.  Evaluation in the ED patient states her left first stage brachiobasilic fistula is working well.  This was placed on 10/14/18.  She can still feel a thrill in her fistula.  She says she has some numbness and tingling in the hand that is minimal and very manageable.  No weakness.          Past Medical History:    Diagnosis   Date    .   CKD (chronic kidney disease) stage 5, GFR less than 15 ml/min (HCC)   06/19/2016        06/30/16: Seen  by Dr. Posey Pronto [Nephrology]. Suspect etiologies to include diabetes and hypertension though cannot exclude HIV-associated nephropathy. Advised her against from using excessive Goody powder.    .   Contact dermatitis   04/21/2016        07/29/16: Skin biopsy performed by [facility] notable for acute spongiotic dermatitis consistent with contact dermatitis.    .   Cyclical vomiting            THC induced???    .   Depression   06/28/2006        Qualifier: Diagnosis of  By: Riccardo Dubin MD, Phelix Fudala      .   Diabetes mellitus type 2 in obese ([place])   06/28/1994    .   DKA (diabetic ketoacidoses) ([place])   12/2014    .   Erosive esophagitis        .  Esophageal reflux        .   Essential hypertension   11/16/2013    .   Gastroparesis            ? diabetic    .   GERD (gastroesophageal reflux disease)        .   Human immunodeficiency virus (HIV) disease ([place])   04/23/2016    .   Hypertension        .   Moderate nonproliferative diabetic retinopathy of both eyes ([place])   11/21/2014        11/14/14: Noted on retinal imaging; needs follow-up imaging in 6 months  05/22/16: Noted on retinal imaging again; needs follow-up imaging in 6 months    .   Normocytic anemia        .   TOBACCO USER   06/28/2006        Qualifier: Diagnosis of  By: Riccardo Dubin MD, Sherren Mocha      .   Type II diabetes mellitus ([place])   1994        diagnosed around 1994               Past Surgical History:    Procedure   Laterality   Date    .   AMPUTATION   Left   07/08/2018        Procedure: AMPUTATION FORTH RAY LEFT FOOT;  Surgeon: [name], MD;  Location: [place];  Service: Orthopedics;  Laterality: Left;    .   AMPUTATION   Left   08/09/2018        Procedure: Left Transmetatarsal Amputation;  Surgeon: [name], MD;  Location: [place];  Service: Orthopedics;  Laterality:  Left;    .   AMPUTATION   Left   10/08/2018        Procedure: LEFT BELOW KNEE AMPUTATION;  Surgeon: [name], MD;  Location: [place];  Service: Orthopedics;  Laterality: Left;    .   AMPUTATION   Left   10/28/2018        Procedure: REVISION BELOW KNEE AMPUTATION;  Surgeon: [name], MD;  Location: [place];  Service: Orthopedics;  Laterality: Left;    .   AV FISTULA PLACEMENT   Left   10/14/2018        Procedure: Arteriovenous (Av) Fistula Creation Left Arm;  Surgeon: Kathy Heck, MD;  Location: [place];  Service: Vascular;  Laterality: Left;    .   LOWER EXTREMITY ANGIOGRAPHY   N/A   07/05/2018        Procedure: LOWER EXTREMITY ANGIOGRAPHY;  Surgeon: Serafina Mitchell, MD;  Location: [place] [facility];  Service: Cardiovascular;  Laterality: N/A;    .   PERIPHERAL VASCULAR BALLOON ANGIOPLASTY   Left   07/05/2018        Procedure: PERIPHERAL VASCULAR BALLOON ANGIOPLASTY;  Surgeon: Serafina Mitchell, MD;  Location: [place] [facility];  Service: Cardiovascular;  Laterality: Left;  SFA    .   STUMP REVISION   Left   10/19/2018        Procedure: REVISION LEFT BELOW KNEE AMPUTATION;  Surgeon: [name], MD;  Location: [place];  Service: Orthopedics;  Laterality: Left;    .   TUBAL LIGATION       2002         No Known Allergies             Prior  to Admission medications     Medication   Sig   Start Date   End Date   Taking?   Authorizing Provider    amLODipine (NORVASC) 10 MG tablet   Take 1 tablet (10 mg total) by mouth daily.   12/24/17   10/27/27       Rice, Resa Miner, MD    calcitRIOL (ROCALTROL) 0.25 MCG capsule   Take 1 capsule (0.25 mcg total) by mouth daily.   10/17/18           [name], MD    calcium acetate (PHOSLO) 667 MG capsule   Take 2 capsules (1,334 mg total) by mouth 3 (three) times daily with meals.   10/16/18            [name], MD    citalopram (CELEXA) 20 MG tablet   Take 1 tablet (20 mg total) by mouth daily.   08/12/18   10/27/27       Carroll Sage, MD    Darbepoetin Alfa (ARANESP) 60 MCG/0.3ML SOSY injection   Inject 0.3 mLs (60 mcg total) into the skin every Tuesday at 6 PM.   10/18/18           [name], MD    gabapentin (NEURONTIN) 300 MG capsule   Take 1 capsule (300 mg total) by mouth 2 (two) times daily.   11/17/18           Rayburn, Neta Mends, PA-C    glucose blood (ACCU-CHEK GUIDE) test strip   USE TO TEST BLOOD SUGAR 3 TIMES DAILY . Diagnosis code  E11.69, E66.9   07/09/17           Lars Mage, MD    Insulin Glargine (LANTUS) 100 UNIT/ML Solostar Pen   Inject 5 Units into the skin at bedtime.   10/16/18           [name], MD    JULUCA 50-25 MG TABS   TAKE 1 TABLET BY MOUTH DAILY. PLEASE TAKE WITH LARGEST MEAL OF THE DAY.   11/11/18   02/09/19       Lars Mage, MD    labetalol (NORMODYNE) 200 MG tablet   Take 1 tablet (200 mg total) by mouth 2 (two) times daily.   10/21/18           [name], MD    liraglutide (VICTOZA) 18 MG/3ML SOPN   Inject 0.2 mLs (1.2 mg total) into the skin daily.   02/04/18           Chundi, Verne Spurr, MD    megestrol (MEGACE) 40 MG tablet   Take 1 tablet (40 mg total) by mouth daily.   10/21/18           [name], MD    methocarbamol (ROBAXIN) 500 MG tablet   Take 1 tablet (500 mg total) by mouth every 6 (six) hours as needed for muscle spasms.   10/21/18           [name], MD    nutrition supplement, JUVEN, (JUVEN) PACK   Take 1 packet by mouth 2 (two) times daily between meals.   10/16/18           [name], MD    ondansetron (ZOFRAN) 4 MG tablet   Take 1 tablet (4 mg total) by mouth daily as needed for nausea or vomiting.   08/19/18   08/19/19       Alphonzo Grieve, MD  oxyCODONE-acetaminophen (PERCOCET/ROXICET) 5-325 MG tablet   Take 1 tablet by mouth every 6 (six) hours as needed for moderate pain or severe pain.   11/17/18           Rayburn, Neta Mends, PA-C    polyethylene glycol (MIRALAX / GLYCOLAX) packet   Take 17 g by mouth daily.  Patient taking differently: Take 17 g by mouth daily as needed for mild constipation.    08/12/18           Carroll Sage, MD    senna (SENOKOT) 8.6 MG TABS tablet   Take 2 tablets (17.2 mg total) by mouth 2 (two) times daily.  Patient taking differently: Take 2 tablets by mouth daily as needed for mild constipation.    08/12/18           Carroll Sage, MD    silver sulfADIAZINE (SILVADENE) 1 % cream   Apply 1 application topically daily.   11/17/18           Rayburn, Neta Mends, PA-C    sodium bicarbonate 650 MG tablet   Take 2 tablets (1,300 mg total) by mouth 2 (two) times daily.   10/16/18           [name], MD    SPS 15 GM/60ML suspension   TAKE 60 MILLILITERS (15 G TOTAL) BY MOUTH DAILY.   11/11/18           Lars Mage, MD           Social History             Socioeconomic History    .   Marital status:   Single            Spouse name:   Not on file    .   Number of children:   Not on file    .   Years of education:   20    .   Highest education level:   Not on file    Occupational History            Employer:   UNEMPLOYED    Social Needs    .   Financial resource strain:   Not on file    .   Food insecurity:            Worry:   Not on file            Inability:   Not on file    .   Transportation needs:            Medical:   Not on file            Non-medical:   Not on file    Tobacco Use    .   Smoking status:   Former Smoker            Packs/day:   0.10            Years:   10.00             Pack years:   1.00            Types:   Cigarettes            Start date:   11/17/2013            Last attempt to quit:   09/09/2014            Years since quitting:   4.2    .  Smokeless tobacco:   Never Used    Substance and Sexual Activity    .   Alcohol use:   Yes            Comment: Occasionally.    .   Drug use:   Yes            Frequency:   4.0 times per week            Types:   Marijuana    .   Sexual activity:   Not on file    Lifestyle    .   Physical activity:            Days per week:   Not on file            Minutes per session:   Not on file    .   Stress:   Not on file    Relationships    .   Social connections:            Talks on phone:   Not on file            Gets together:   Not on file            Attends religious service:   Not on file            Active member of club or organization:   Not on file            Attends meetings of clubs or organizations:   Not on file            Relationship status:   Not on file    .   Intimate partner violence:            Fear of current or ex partner:   Not on file            Emotionally abused:   Not on file            Physically abused:   Not on file            Forced sexual activity:   Not on file    Other Topics   Concern    .   Not on file    Social History Narrative    .   Not on file                  Family History    Problem   Relation   Age of Onset    .   Diabetes   Mother        .   Diabetes   Brother        .   Diabetes   Daughter        .   Diabetes   Daughter        .   Mental retardation   Brother                died from PNA    .   Diabetes   Maternal Grandmother             ROS: [x]  Positive   [ ]   Negative   [ ]  All sytems reviewed and are negative     Cardiovascular:  []  chest pain/pressure  []  palpitations  []  SOB lying flat  [x]  DOE  []  pain in legs while walking  []  pain in legs at rest  []  pain in  legs at night  []  non-healing ulcers  []  hx of DVT  []  swelling in legs     Pulmonary:  []  productive cough  []  asthma/wheezing  []  home O2     Neurologic:  []  weakness in []  arms []  legs  []  numbness in []  arms []  legs  []  hx of CVA []  mini stroke  [] difficulty speaking or slurred speech  []  temporary loss of vision in one eye  []  dizziness     Hematologic:  []  hx of cancer  []  bleeding problems  []  problems with blood clotting easily     Endocrine:    []  diabetes []  thyroid disease     GI  []  vomiting blood  []  blood in stool     GU:  []  CKD/renal failure []  HD--[]  M/W/F or []  T/T/S  []  burning with urination  []  blood in urine     Psychiatric:  []  anxiety  []  depression     Musculoskeletal:  []  arthritis  []  joint pain     Integumentary:  []  rashes []  ulcers     Constitutional:  []  fever []  chills        Physical Examination          Vitals:        11/24/18 1430   11/24/18 1445    BP:   (!) 144/75   (!) 155/84    Pulse:            Resp:            Temp:            SpO2:              Body mass index is 27.46 kg/m.     General:  Laying in Biomedical scientist in ED  Gait: Not observed  HENT: WNL, normocephalic  Pulmonary: normal non-labored breathing  Cardiac: regular, without  Murmurs, rubs or gallops  Abdomen: soft, NT/ND, no masses  Vascular Exam/Pulses:  Left radial pulse palpable  Good thrill in left brachiobasilic fistula and incisions well healed  Extremities: Left BKA wrapped  Musculoskeletal: no muscle wasting or atrophy        Neurologic: A&O X 3; Appropriate Affect ; SENSATION: normal; MOTOR FUNCTION:  moving all  extremities equally. Speech is fluent/normal        CBC  Labs (Brief)  BMET  Labs (Brief)                                                                                                                                                                                                                                  COAGS:  Recent Labs                                                            Non-Invasive Vascular Imaging:       pending        ASSESSMENT/PLAN: This is a 43 y.o. female with progression of her stage V chronic kidney disease that presents with volume overload, pulmonary edema and dyspnea.  Vascular surgery has been asked to evaluate for possible left second stage brachiobasilic fistula during this admission.  I have asked the medicine team to order a fistula duplex and we will evaluate for appropriate maturation of the fistula after the first stage and could tentatively look into next week for the second stage.  If she is discharged before next week we can arrange on an outpatient basis for next week.  No immediate needs for dialysis at this time and does not need a catheter right now.     Kathy Heck, MD  [facility]  Office:  [phone]  Pager: [phone]  Addendum:  The patient has been re-examined and re-evaluated.  The patient's history and physical has been reviewed and is unchanged.    Kathy Frank is a 43 y.o. female is being admitted with chronic kidney disease stage 6. All the risks, benefits and other treatment options have been discussed with the patient. The patient has consented to proceed with Procedure(s): BASILIC VEIN TRANSPOSITION SECOND STAGE LEFT ARM INSERTION OF DIALYSIS CATHETER as a surgical intervention.  Kathy Frank 04/14/2019 11:37 AM Vascular and Vein Surgery

## 2019-04-14 NOTE — Progress Notes (Signed)
Dr. Glennon Mac notified that patient's hemoglobin 8.2 on iStat.  It is noted that patient has a history of anemia and get iron infusions, last one was on 04/10/19.  No new orders received.  Will continue to monitor patient.

## 2019-04-14 NOTE — Anesthesia Procedure Notes (Signed)
Procedure Name: MAC Date/Time: 04/14/2019 2:05 PM Performed by: Eligha Bridegroom, CRNA Pre-anesthesia Checklist: Patient identified, Emergency Drugs available, Suction available, Patient being monitored and Timeout performed Patient Re-evaluated:Patient Re-evaluated prior to induction Oxygen Delivery Method: Nasal cannula Preoxygenation: Pre-oxygenation with 100% oxygen

## 2019-04-14 NOTE — Discharge Instructions (Addendum)
° °  [facility] ° °Discharge Instructions ° °AV Fistula or Graft Surgery for Dialysis Access ° °Please refer to the following instructions for your post-procedure care. Your surgeon or physician assistant will discuss any changes with you. ° °Activity ° °You may drive the day following your surgery, if you are comfortable and no longer taking prescription pain medication. Resume full activity as the soreness in your incision resolves. ° °Bathing/Showering ° °You may shower after you go home. Keep your incision dry for 48 hours. Do not soak in a bathtub, hot tub, or swim until the incision heals completely. You may not shower if you have a hemodialysis catheter. ° °Incision Care ° °Clean your incision with mild soap and water after 48 hours. Pat the area dry with a clean towel. You do not need a bandage unless otherwise instructed. Do not apply any ointments or creams to your incision. You may have skin glue on your incision. Do not peel it off. It will come off on its own in about one week. Your arm may swell a bit after surgery. To reduce swelling use pillows to elevate your arm so it is above your heart. Your doctor will tell you if you need to lightly wrap your arm with an ACE bandage. ° °Diet ° °Resume your normal diet. There are not special food restrictions following this procedure. In order to heal from your surgery, it is CRITICAL to get adequate nutrition. Your body requires vitamins, minerals, and protein. Vegetables are the best source of vitamins and minerals. Vegetables also provide the perfect balance of protein. Processed food has little nutritional value, so try to avoid this. ° °Medications ° °Resume taking all of your medications. If your incision is causing pain, you may take over-the counter pain relievers such as acetaminophen (Tylenol). If you were prescribed a stronger pain medication, please be aware these medications can cause nausea and constipation. Prevent  nausea by taking the medication with a snack or meal. Avoid constipation by drinking plenty of fluids and eating foods with high amount of fiber, such as fruits, vegetables, and grains. Do not take Tylenol if you are taking prescription pain medications. ° ° ° ° °Follow up °Your surgeon may want to see you in the office following your access surgery. If so, this will be arranged at the time of your surgery. ° °Please call us immediately for any of the following conditions: ° °Increased pain, redness, drainage (pus) from your incision site °Fever of 101 degrees or higher °Severe or worsening pain at your incision site °Hand pain or numbness. ° °Reduce your risk of vascular disease: ° °Stop smoking. If you would like help, call [facility] at 1-800-QUIT-NOW (1-800-784-8669) or [place] at [phone] ° °Manage your cholesterol °Maintain a desired weight °Control your diabetes °Keep your blood pressure down ° °Dialysis ° °It will take several weeks to several months for your new dialysis access to be ready for use. Your surgeon will determine when it is OK to use it. Your nephrologist will continue to direct your dialysis. You can continue to use your Permcath until your new access is ready for use. ° °If you have any questions, please call the office at [phone]. ° °

## 2019-04-14 NOTE — Anesthesia Preprocedure Evaluation (Addendum)
Anesthesia Evaluation  Patient identified by MRN, date of birth, ID band Patient awake    Reviewed: Allergy & Precautions, NPO status , Patient's Chart, lab work & pertinent test results  History of Anesthesia Complications Negative for: history of anesthetic complications  Airway Mallampati: II  TM Distance: >3 FB Neck ROM: Full    Dental  (+) Poor Dentition, Missing, Chipped, Dental Advisory Given   Pulmonary COPD,  COPD inhaler, Current Smoker and Patient abstained from smoking., former smoker,  04/14/2019 coronavirus neg   breath sounds clear to auscultation       Cardiovascular hypertension, Pt. on medications (-) angina Rhythm:Regular Rate:Normal  11/2018 ECHO: EF 60-65%, valves OK   Neuro/Psych Depression negative neurological ROS     GI/Hepatic GERD  Controlled,(+)     substance abuse  marijuana use,   Endo/Other  diabetes (glu 99), Insulin Dependent  Renal/GU ESRFRenal disease (K+ 4.0, not on dialysis yet)     Musculoskeletal   Abdominal   Peds  Hematology  (+) Blood dyscrasia (Hb 8.2), anemia , HIV,   Anesthesia Other Findings   Reproductive/Obstetrics                           Anesthesia Physical Anesthesia Plan  ASA: III  Anesthesia Plan: MAC   Post-op Pain Management:    Induction:   PONV Risk Score and Plan: 2 and Treatment may vary due to age or medical condition  Airway Management Planned: Natural Airway and Simple Face Mask  Additional Equipment:   Intra-op Plan:   Post-operative Plan:   Informed Consent: I have reviewed the patients History and Physical, chart, labs and discussed the procedure including the risks, benefits and alternatives for the proposed anesthesia with the patient or authorized representative who has indicated his/her understanding and acceptance.     Dental advisory given  Plan Discussed with: CRNA and Surgeon  Anesthesia Plan  Comments:        Anesthesia Quick Evaluation

## 2019-04-14 NOTE — Transfer of Care (Signed)
Immediate Anesthesia Transfer of Care Note  Patient: Kathy Frank  Procedure(s) Performed: BASILIC VEIN TRANSPOSITION SECOND STAGE LEFT ARM (Left ) INSERTION OF DIALYSIS CATHETER (Right Neck)  Patient Location: PACU  Anesthesia Type:MAC  Level of Consciousness: awake and alert   Airway & Oxygen Therapy: Patient Spontanous Breathing  Post-op Assessment: Report given to RN and Post -op Vital signs reviewed and stable  Post vital signs: Reviewed and stable  Last Vitals:  Vitals Value Taken Time  BP    Temp    Pulse    Resp    SpO2      Last Pain:  Vitals:   04/14/19 1138  TempSrc:   PainSc: 0-No pain         Complications: No apparent anesthesia complications

## 2019-04-14 NOTE — Op Note (Signed)
    OPERATIVE REPORT  DATE OF SURGERY: 04/14/2019  PATIENT: Kathy Frank, 43 y.o. female MRN: [id number]  DOB: [birth year]  PRE-OPERATIVE DIAGNOSIS: End-stage renal disease  POST-OPERATIVE DIAGNOSIS:  Same  PROCEDURE: #1 right IJ tunneled hemodialysis catheter, #2 left second stage basilic vein transposition  SURGEON:  Curt Jews, M.D.  PHYSICIAN ASSISTANT: Matt Eveland, PA-C  ANESTHESIA: Local with sedation  EBL: per anesthesia record  Total I/O In: 350 [I.V.:350] Out: 10 [Blood:10]  BLOOD ADMINISTERED: none  DRAINS: none  SPECIMEN: none  COUNTS CORRECT:  YES  PATIENT DISPOSITION:  PACU - hemodynamically stable  PROCEDURE DETAILS: The patient was taken the operating placed supine position where the area of the left arm and right and left neck and chest prepped draped in usual sterile fashion.  Using SonoSite visualization the right internal jugular vein was accessed and a guidewire was passed down to the right atrium and this was confirmed with fluoroscopy.  A dilator and peel-away sheath was passed over the guidewire and the dilator and guidewire were removed.  A 23 cm catheter was positioned to the level of the distal right atrium.  The catheter was brought through a subcutaneous tunnel through a separate stab incision.  2 lm ports were attached and both lumens flushed and aspirated easily and were locked with 1000 unit/cc heparin.  The catheter was secured to skin with a 3-0 nylon stitch and the entry site was closed with a 4-0 subcuticular Vicryl stitch.  Attention was then turned to the left arm.  The patient had a prior for stage brachial artery to basilic vein fistula with excellent maturation.  The incision was made over the antecubital space over the prior anastomosis and carried down to isolate the brachial artery proximal distal to the old anastomosis.  The basilic vein was exposed through several additional incisions up to the axilla.  The tributary branches were  ligated with 3-0 and 4-0 silk ties and divided.  The vein was mobilized circumferentially to the axillary vein down to the antecubital space.  The vein was occluded near the prior anastomosis and the vein was transected.  The vein was brought out of the prior created tunnel and was marked to reduce risk of twisting.  A new subcutaneous tunnel was created from the level of the antecubital space to the axilla and the vein was brought back through this tunnel.  The brachial artery was occluded proximal distal to the old anastomosis and the old anastomosis was taken down.  The vein was cut to the appropriate length and was spatulated and sewn end-to-side to the artery with a running 6-0 Prolene suture.  Clamps removed and excellent thrill was noted.  The wounds irrigated with saline.  Hemostasis to electrocautery.  The wounds were closed with 3-0 Vicryl in the subcutaneous and subcuticular tissue.  Sterile dressing was applied.  The patient maintained a 2+ radial pulse.  Patient was transferred to the recovery room where chest x-rays pending   Rosetta Posner, M.D., [facility] 04/14/2019 5:39 PM

## 2019-04-14 NOTE — Anesthesia Postprocedure Evaluation (Signed)
Anesthesia Post Note  Patient: Kathy Frank  Procedure(s) Performed: BASILIC VEIN TRANSPOSITION SECOND STAGE LEFT ARM (Left ) INSERTION OF DIALYSIS CATHETER (Right Neck)     Patient location during evaluation: PACU Anesthesia Type: MAC Level of consciousness: awake and alert, patient cooperative and oriented Pain management: pain level controlled Vital Signs Assessment: post-procedure vital signs reviewed and stable Respiratory status: nonlabored ventilation, spontaneous breathing and respiratory function stable Cardiovascular status: blood pressure returned to baseline and stable Postop Assessment: no apparent nausea or vomiting Anesthetic complications: no    Last Vitals:  Vitals:   04/14/19 1107 04/14/19 1710  BP: (!) 184/80 (!) 160/78  Pulse: 77 76  Resp: 18 19  Temp: 36.6 C 36.6 C  SpO2: 100% 97%    Last Pain:  Vitals:   04/14/19 1710  TempSrc:   PainSc: 0-No pain                 Rakeisha Nyce,E. Tore Carreker

## 2019-04-15 ENCOUNTER — Encounter (HOSPITAL_COMMUNITY): Payer: Self-pay | Admitting: Vascular Surgery

## 2019-04-15 MED FILL — OXYCODONE-ACETAMINOPHEN 5-3: 5-325 | 5 days supply | Qty: 20 | Fill #0

## 2019-04-18 ENCOUNTER — Telehealth: Payer: Self-pay | Admitting: *Deleted

## 2019-04-18 ENCOUNTER — Telehealth: Payer: Self-pay | Admitting: Licensed Clinical Social Worker

## 2019-04-18 ENCOUNTER — Encounter (HOSPITAL_COMMUNITY): Payer: Medicaid Other

## 2019-04-18 NOTE — Telephone Encounter (Signed)
Patient was contacted to discuss the referral from her doctor (2nd attempt). Patient is declining services at this time, but understands that she can request services at any time.

## 2019-04-18 NOTE — Telephone Encounter (Signed)
Call from patient c/o post-op pain no relief with Percocet. Denies and redness, heat at site. No fever or chills. Positive CMS to hand according to patient and states no increase pain with grip, scant drainage.  She claims she hurts from the neck down to hand. She states she has been elevating extremity. I instructed her to keep arm elevated above level of heart move fingers and scheduled to see PA on 04/19/2019. Instructed to go to [facility] ER for any acute or worsening condition.

## 2019-04-19 ENCOUNTER — Other Ambulatory Visit: Payer: Self-pay

## 2019-04-19 ENCOUNTER — Ambulatory Visit (INDEPENDENT_AMBULATORY_CARE_PROVIDER_SITE_OTHER): Payer: Self-pay | Admitting: Physician Assistant

## 2019-04-19 VITALS — BP 160/74 | HR 80 | Temp 98.0°F | Resp 12 | Ht 65.0 in | Wt 163.0 lb

## 2019-04-19 DIAGNOSIS — N185 Chronic kidney disease, stage 5: Secondary | ICD-10-CM

## 2019-04-19 NOTE — Progress Notes (Signed)
    Postoperative Access Visit   History of Present Illness   Kathy Frank is a 43 y.o. year old female who presents for postoperative follow-up for: left second stage basilic vein transposition by Dr. Donnetta Hutching (Date: 04/14/19).  The patient's incisions are healed.  The patient denies steal symptoms.  She does complain of edema of left upper extremity from hand to upper arm with local ecchymosis in area of incisions.  She denies any bleeding or drainage.  She is dialyzing via R IJ TDC.   Physical Examination   Vitals:   04/19/19 1353  BP: (!) 160/74  Pulse: 80  Resp: 12  Temp: 98 F (36.7 C)  TempSrc: Temporal  SpO2: 97%  Weight: 163 lb (73.9 kg)  Height: 5\' 5"  (1.651 m)   Body mass index is 27.12 kg/m.  left arm Incisions are healed, hand grip is /5, sensation in digits is intact, palpable thrill, bruit can be auscultated, palpable L radial pulse     Medical Decision Making   Kathy Frank is a 43 y.o. year old female who presents s/p left second stage basilic vein transposition   Patent fistula without signs or symptoms of steal syndrome  Left arm wrapped with Ace from hand to shoulder  Encouraged elevation of left arm and exercising left hand  Follow-up in 2 to 3 weeks to recheck left arm  Dagoberto Ligas PA-C [facility] Office: [phone]  Clinic MD: Dr. Oneida Alar

## 2019-04-20 MED FILL — AMLODIPINE BESYLATE 10 MG T: 10 | 90 days supply | Qty: 90 | Fill #0

## 2019-04-20 MED FILL — BIKTARVY 50-200-25 MG TABS: 50-200-25 | 30 days supply | Qty: 30 | Fill #2

## 2019-04-20 MED FILL — LABETALOL HCL 300 MG TABS: 300 | 90 days supply | Qty: 270 | Fill #0

## 2019-04-24 DIAGNOSIS — D689 Coagulation defect, unspecified: Secondary | ICD-10-CM

## 2019-04-24 DIAGNOSIS — N2581 Secondary hyperparathyroidism of renal origin: Secondary | ICD-10-CM | POA: Insufficient documentation

## 2019-04-24 DIAGNOSIS — F329 Major depressive disorder, single episode, unspecified: Secondary | ICD-10-CM | POA: Insufficient documentation

## 2019-04-24 DIAGNOSIS — T829XXA Unspecified complication of cardiac and vascular prosthetic device, implant and graft, initial encounter: Secondary | ICD-10-CM | POA: Insufficient documentation

## 2019-04-24 HISTORY — DX: Coagulation defect, unspecified: D68.9

## 2019-05-01 DIAGNOSIS — E1151 Type 2 diabetes mellitus with diabetic peripheral angiopathy without gangrene: Secondary | ICD-10-CM | POA: Insufficient documentation

## 2019-05-01 HISTORY — DX: Type 2 diabetes mellitus with diabetic peripheral angiopathy without gangrene: E11.51

## 2019-05-02 ENCOUNTER — Encounter (HOSPITAL_COMMUNITY): Payer: Self-pay | Admitting: Emergency Medicine

## 2019-05-02 ENCOUNTER — Emergency Department (HOSPITAL_COMMUNITY): Payer: Medicaid Other

## 2019-05-02 ENCOUNTER — Encounter (HOSPITAL_COMMUNITY): Payer: Medicaid Other

## 2019-05-02 ENCOUNTER — Other Ambulatory Visit: Payer: Self-pay

## 2019-05-02 ENCOUNTER — Observation Stay (HOSPITAL_COMMUNITY)
Admission: EM | Admit: 2019-05-02 | Discharge: 2019-05-03 | Disposition: A | Discharge status: Home / Self Care | Payer: Medicaid Other | Attending: Internal Medicine | Admitting: Internal Medicine

## 2019-05-02 DIAGNOSIS — K3184 Gastroparesis: Secondary | ICD-10-CM | POA: Diagnosis not present

## 2019-05-02 DIAGNOSIS — Z79899 Other long term (current) drug therapy: Secondary | ICD-10-CM | POA: Diagnosis not present

## 2019-05-02 DIAGNOSIS — Z794 Long term (current) use of insulin: Secondary | ICD-10-CM | POA: Diagnosis not present

## 2019-05-02 DIAGNOSIS — E113293 Type 2 diabetes mellitus with mild nonproliferative diabetic retinopathy without macular edema, bilateral: Secondary | ICD-10-CM | POA: Diagnosis not present

## 2019-05-02 DIAGNOSIS — Z9889 Other specified postprocedural states: Secondary | ICD-10-CM

## 2019-05-02 DIAGNOSIS — Z87891 Personal history of nicotine dependence: Secondary | ICD-10-CM | POA: Diagnosis not present

## 2019-05-02 DIAGNOSIS — N186 End stage renal disease: Secondary | ICD-10-CM

## 2019-05-02 DIAGNOSIS — E1169 Type 2 diabetes mellitus with other specified complication: Secondary | ICD-10-CM | POA: Diagnosis present

## 2019-05-02 DIAGNOSIS — E119 Type 2 diabetes mellitus without complications: Secondary | ICD-10-CM | POA: Diagnosis present

## 2019-05-02 DIAGNOSIS — D631 Anemia in chronic kidney disease: Secondary | ICD-10-CM

## 2019-05-02 DIAGNOSIS — I120 Hypertensive chronic kidney disease with stage 5 chronic kidney disease or end stage renal disease: Secondary | ICD-10-CM | POA: Diagnosis not present

## 2019-05-02 DIAGNOSIS — Z992 Dependence on renal dialysis: Secondary | ICD-10-CM | POA: Insufficient documentation

## 2019-05-02 DIAGNOSIS — M898X9 Other specified disorders of bone, unspecified site: Secondary | ICD-10-CM

## 2019-05-02 DIAGNOSIS — E1122 Type 2 diabetes mellitus with diabetic chronic kidney disease: Secondary | ICD-10-CM | POA: Diagnosis not present

## 2019-05-02 DIAGNOSIS — Z833 Family history of diabetes mellitus: Secondary | ICD-10-CM | POA: Diagnosis not present

## 2019-05-02 DIAGNOSIS — D649 Anemia, unspecified: Secondary | ICD-10-CM | POA: Diagnosis present

## 2019-05-02 DIAGNOSIS — I10 Essential (primary) hypertension: Secondary | ICD-10-CM | POA: Diagnosis present

## 2019-05-02 DIAGNOSIS — Z20828 Contact with and (suspected) exposure to other viral communicable diseases: Secondary | ICD-10-CM | POA: Diagnosis not present

## 2019-05-02 DIAGNOSIS — E889 Metabolic disorder, unspecified: Secondary | ICD-10-CM

## 2019-05-02 DIAGNOSIS — E1143 Type 2 diabetes mellitus with diabetic autonomic (poly)neuropathy: Secondary | ICD-10-CM | POA: Insufficient documentation

## 2019-05-02 DIAGNOSIS — E8889 Other specified metabolic disorders: Secondary | ICD-10-CM

## 2019-05-02 DIAGNOSIS — Z0184 Encounter for antibody response examination: Secondary | ICD-10-CM | POA: Diagnosis not present

## 2019-05-02 DIAGNOSIS — F329 Major depressive disorder, single episode, unspecified: Secondary | ICD-10-CM | POA: Diagnosis not present

## 2019-05-02 DIAGNOSIS — Z89612 Acquired absence of left leg above knee: Secondary | ICD-10-CM | POA: Insufficient documentation

## 2019-05-02 DIAGNOSIS — E669 Obesity, unspecified: Secondary | ICD-10-CM | POA: Diagnosis present

## 2019-05-02 DIAGNOSIS — N189 Chronic kidney disease, unspecified: Secondary | ICD-10-CM | POA: Diagnosis present

## 2019-05-02 DIAGNOSIS — B20 Human immunodeficiency virus [HIV] disease: Secondary | ICD-10-CM | POA: Diagnosis not present

## 2019-05-02 DIAGNOSIS — Z21 Asymptomatic human immunodeficiency virus [HIV] infection status: Secondary | ICD-10-CM

## 2019-05-02 DIAGNOSIS — K219 Gastro-esophageal reflux disease without esophagitis: Secondary | ICD-10-CM | POA: Insufficient documentation

## 2019-05-02 DIAGNOSIS — E8770 Fluid overload, unspecified: Secondary | ICD-10-CM | POA: Diagnosis not present

## 2019-05-02 DIAGNOSIS — R0602 Shortness of breath: Secondary | ICD-10-CM | POA: Diagnosis present

## 2019-05-02 DIAGNOSIS — Z89512 Acquired absence of left leg below knee: Secondary | ICD-10-CM

## 2019-05-02 DIAGNOSIS — I1311 Hypertensive heart and chronic kidney disease without heart failure, with stage 5 chronic kidney disease, or end stage renal disease: Secondary | ICD-10-CM | POA: Insufficient documentation

## 2019-05-02 HISTORY — DX: End stage renal disease: N18.6

## 2019-05-02 HISTORY — DX: Other specified disorders of bone, unspecified site: M89.8X9

## 2019-05-02 HISTORY — DX: Metabolic disorder, unspecified: E88.9

## 2019-05-02 HISTORY — DX: End stage renal disease: Z99.2

## 2019-05-02 LAB — CBC WITH DIFFERENTIAL/PLATELET
Abs Immature Granulocytes: 0.02 10*3/uL (ref 0.00–0.07)
Basophils Absolute: 0.1 10*3/uL (ref 0.0–0.1)
Basophils Relative: 1 %
Eosinophils Absolute: 0.5 10*3/uL (ref 0.0–0.5)
Eosinophils Relative: 9 %
HCT: 22.2 % — ABNORMAL LOW (ref 36.0–46.0)
Hemoglobin: 7 g/dL — ABNORMAL LOW (ref 12.0–15.0)
Immature Granulocytes: 0 %
Lymphocytes Relative: 19 %
Lymphs Abs: 1.1 10*3/uL (ref 0.7–4.0)
MCH: 31.7 pg (ref 26.0–34.0)
MCHC: 31.5 g/dL (ref 30.0–36.0)
MCV: 100.5 fL — ABNORMAL HIGH (ref 80.0–100.0)
Monocytes Absolute: 0.3 10*3/uL (ref 0.1–1.0)
Monocytes Relative: 6 %
Neutro Abs: 3.9 10*3/uL (ref 1.7–7.7)
Neutrophils Relative %: 65 %
Platelets: 314 10*3/uL (ref 150–400)
RBC: 2.21 MIL/uL — ABNORMAL LOW (ref 3.87–5.11)
RDW: 17.1 % — ABNORMAL HIGH (ref 11.5–15.5)
WBC: 5.9 10*3/uL (ref 4.0–10.5)
nRBC: 0.3 % — ABNORMAL HIGH (ref 0.0–0.2)

## 2019-05-02 LAB — URINALYSIS, ROUTINE W REFLEX MICROSCOPIC
Bilirubin Urine: NEGATIVE
Glucose, UA: NEGATIVE mg/dL
Ketones, ur: NEGATIVE mg/dL
Leukocytes,Ua: NEGATIVE
Nitrite: NEGATIVE
Protein, ur: >=300 mg/dL — AB
Specific Gravity, Urine: 1.016 (ref 1.005–1.030)
pH: 5 (ref 5.0–8.0)

## 2019-05-02 LAB — COMPREHENSIVE METABOLIC PANEL
ALT: 11 U/L (ref 0–44)
AST: 15 U/L (ref 15–41)
Albumin: 3.1 g/dL — ABNORMAL LOW (ref 3.5–5.0)
Alkaline Phosphatase: 111 U/L (ref 38–126)
Anion gap: 15 (ref 5–15)
BUN: 35 mg/dL — ABNORMAL HIGH (ref 6–20)
CO2: 19 mmol/L — ABNORMAL LOW (ref 22–32)
Calcium: 7.2 mg/dL — ABNORMAL LOW (ref 8.9–10.3)
Chloride: 104 mmol/L (ref 98–111)
Creatinine, Ser: 6.29 mg/dL — ABNORMAL HIGH (ref 0.44–1.00)
GFR calc Af Amer: 9 mL/min — ABNORMAL LOW (ref >60)
GFR calc non Af Amer: 8 mL/min — ABNORMAL LOW (ref >60)
Glucose, Bld: 160 mg/dL — ABNORMAL HIGH (ref 70–99)
Potassium: 4 mmol/L (ref 3.5–5.1)
Sodium: 138 mmol/L (ref 135–145)
Total Bilirubin: 0.5 mg/dL (ref 0.3–1.2)
Total Protein: 7.3 g/dL (ref 6.5–8.1)

## 2019-05-02 LAB — SARS CORONAVIRUS 2 BY RT PCR (HOSPITAL ORDER, PERFORMED IN ~~LOC~~ HOSPITAL LAB): SARS Coronavirus 2: NEGATIVE

## 2019-05-02 LAB — I-STAT BETA HCG BLOOD, ED (MC, WL, AP ONLY): I-stat hCG, quantitative: <5 m[IU]/mL (ref <5)

## 2019-05-02 MED ORDER — INSULIN GLARGINE 100 UNIT/ML ~~LOC~~ SOLN
5.0000 [IU] | Freq: Every day | SUBCUTANEOUS | Status: DC
  Administered 2019-05-03: 5 [IU] via SUBCUTANEOUS
  Filled 2019-05-02 (×2): qty 0.05

## 2019-05-02 MED ORDER — CALCITRIOL 0.25 MCG PO CAPS
0.2500 ug | ORAL_CAPSULE | Freq: Every day | ORAL | Status: DC
  Administered 2019-05-03: 0.25 ug via ORAL
  Filled 2019-05-02: qty 1

## 2019-05-02 MED ORDER — ACETAMINOPHEN 650 MG RE SUPP: 650.0000 mg | Freq: Four times a day (QID) | RECTAL | Status: DC | PRN

## 2019-05-02 MED ORDER — OXYCODONE-ACETAMINOPHEN 5-325 MG PO TABS
1.0000 | ORAL_TABLET | Freq: Four times a day (QID) | ORAL | Status: DC | PRN
  Administered 2019-05-02 – 2019-05-03 (×2): 1 via ORAL
  Filled 2019-05-02 (×2): qty 1

## 2019-05-02 MED ORDER — CHLORHEXIDINE GLUCONATE CLOTH 2 % EX PADS
6.0000 | MEDICATED_PAD | Freq: Every day | CUTANEOUS | Status: DC
  Administered 2019-05-03: 6 via TOPICAL

## 2019-05-02 MED ORDER — AMLODIPINE BESYLATE 10 MG PO TABS
10.0000 mg | ORAL_TABLET | Freq: Every day | ORAL | Status: DC
  Administered 2019-05-02 – 2019-05-03 (×2): 10 mg via ORAL
  Filled 2019-05-02: qty 1
  Filled 2019-05-02: qty 2

## 2019-05-02 MED ORDER — HYDRALAZINE HCL 25 MG PO TABS
25.0000 mg | ORAL_TABLET | Freq: Three times a day (TID) | ORAL | Status: DC
  Administered 2019-05-02 – 2019-05-03 (×3): 25 mg via ORAL
  Filled 2019-05-02 (×3): qty 1

## 2019-05-02 MED ORDER — CITALOPRAM HYDROBROMIDE 20 MG PO TABS
20.0000 mg | ORAL_TABLET | Freq: Every day | ORAL | Status: DC
  Administered 2019-05-02 – 2019-05-03 (×2): 20 mg via ORAL
  Filled 2019-05-02: qty 1
  Filled 2019-05-02: qty 2

## 2019-05-02 MED ORDER — ACETAMINOPHEN 325 MG PO TABS: 650.0000 mg | ORAL_TABLET | Freq: Four times a day (QID) | ORAL | Status: DC | PRN

## 2019-05-02 MED ORDER — INSULIN ASPART 100 UNIT/ML ~~LOC~~ SOLN
0.0000 [IU] | Freq: Three times a day (TID) | SUBCUTANEOUS | Status: DC
  Administered 2019-05-03: 2 [IU] via SUBCUTANEOUS

## 2019-05-02 MED ORDER — FENTANYL CITRATE (PF) 100 MCG/2ML IJ SOLN
50.0000 ug | Freq: Once | INTRAMUSCULAR | Status: AC
  Administered 2019-05-02: 50 ug via INTRAVENOUS
  Filled 2019-05-02: qty 2

## 2019-05-02 MED ORDER — BICTEGRAVIR-EMTRICITAB-TENOFOV 50-200-25 MG PO TABS
1.0000 | ORAL_TABLET | Freq: Every day | ORAL | Status: DC
  Administered 2019-05-03: 1 via ORAL
  Filled 2019-05-02: qty 1

## 2019-05-02 MED ORDER — CALCIUM ACETATE (PHOS BINDER) 667 MG PO CAPS
1334.0000 mg | ORAL_CAPSULE | Freq: Three times a day (TID) | ORAL | Status: DC
  Administered 2019-05-03 (×2): 1334 mg via ORAL
  Filled 2019-05-02 (×3): qty 2

## 2019-05-02 MED ORDER — GABAPENTIN 300 MG PO CAPS
300.0000 mg | ORAL_CAPSULE | Freq: Two times a day (BID) | ORAL | Status: DC
  Administered 2019-05-03 (×2): 300 mg via ORAL
  Filled 2019-05-02 (×2): qty 1

## 2019-05-02 NOTE — Consult Note (Signed)
[facility]: Reason for Consult: To manage dialysis and dialysis related needs Referring Physician: Dr. Gerlene Fee  HPI:  Kathy Frank is an 43 y.o. female hypertension, HIV, tobacco abuse, ESRD on HD TTS at [facility] recently started on dialysis today with shortness of breath and generalized weakness.  Patient had a hemoglobin around 6 at outpatient center and she is supposed to get a blood transfusion today and was too long.  Apparently patient felt weak and shortness of breath therefore came to the ER.  Her last treatment was on Saturday.  In the ER hemoglobin 7, potassium 4.  Urine megaly and atelectasis.  We are consulted to manage ESRD.  Patient was seen and examined in ER.  She denied chest pain, nausea, vomiting, headache or dizziness.  She was already seen by vascular surgeon.  Patient has right IJ tunnel catheter placement and left second stage basilic vein transposition done on 9/4/120.  There is mild swelling with no sign of infection.  Plan to follow-up with vascular surgery as outpatient.  Dialyzes at [facility], TTS EDW 72.5 kg. HD Bath 2K, 2.25 ca, 400/800, 180NR, 4 hours 15 minutes, RIJ TDC, Aranesp 200 mcg on 9/17, PTH 916, Heparin 3000 units.  Past Medical History:  Diagnosis Date  . Below-knee amputation of left lower extremity ([place])   . CKD (chronic kidney disease) stage 5, GFR less than 15 ml/min (HCC) 06/19/2016   06/30/16: Seen by Dr. Posey Pronto [Nephrology]. Suspect etiologies to include diabetes and hypertension though cannot exclude HIV-associated nephropathy. Advised her against from using excessive Goody powder.  . Contact dermatitis 04/21/2016   07/29/16: Skin biopsy performed by [facility] for acute spongiotic dermatitis consistent with contact dermatitis.  . Cyclical vomiting    THC induced???  . Depression 06/28/2006   Qualifier: Diagnosis of  By: Riccardo Dubin MD, [place]    . Diabetes mellitus type 2 in obese ([place]) 06/28/1994   . DKA (diabetic ketoacidoses) ([place]) 12/2014  . Dyspnea   . Erosive esophagitis   . Esophageal reflux   . Essential hypertension 11/16/2013  . Gastroparesis    ? diabetic  . GERD (gastroesophageal reflux disease)   . History of blood transfusion   . Human immunodeficiency virus (HIV) disease ([place]) 04/23/2016  . Hypertension   . Moderate nonproliferative diabetic retinopathy of both eyes ([place]) 11/21/2014   11/14/14: Noted on retinal imaging; needs follow-up imaging in 6 months  05/22/16: Noted on retinal imaging again; needs follow-up imaging in 6 months  . Normocytic anemia   . TOBACCO USER 06/28/2006   Qualifier: Diagnosis of  By: Riccardo Dubin MD, Sherren Mocha    . Type II diabetes mellitus ([place]) 1994   diagnosed around 36  . Wound dehiscence   . Wound infection s/p L transmetatarsal amputation     Past Surgical History:  Procedure Laterality Date  . AMPUTATION Left 07/08/2018   Procedure: AMPUTATION FORTH RAY LEFT FOOT;  Surgeon: [name], MD;  Location: [place];  Service: Orthopedics;  Laterality: Left;  . AMPUTATION Left 08/09/2018   Procedure: Left Transmetatarsal Amputation;  Surgeon: [name], MD;  Location: [place];  Service: Orthopedics;  Laterality: Left;  . AMPUTATION Left 10/08/2018   Procedure: LEFT BELOW KNEE AMPUTATION;  Surgeon: [name], MD;  Location: [place];  Service: Orthopedics;  Laterality: Left;  . AMPUTATION Left 10/28/2018   Procedure: REVISION BELOW KNEE AMPUTATION;  Surgeon: [name], MD;  Location: [place];  Service: Orthopedics;  Laterality: Left;  . AMPUTATION Left 12/16/2018   Procedure: LEFT ABOVE KNEE AMPUTATION;  Surgeon: [name], MD;  Location: [place];  Service: Orthopedics;  Laterality: Left;  . AV FISTULA PLACEMENT Left 10/14/2018   Procedure: Arteriovenous (Av) Fistula Creation Left Arm;  Surgeon: [name], MD;  Location: [place];  Service: Vascular;  Laterality: Left;  . BASCILIC VEIN TRANSPOSITION Left 04/14/2019   Procedure:  BASILIC VEIN TRANSPOSITION SECOND STAGE LEFT ARM;  Surgeon: Rosetta Posner, MD;  Location: [place];  Service: Vascular;  Laterality: Left;  . INSERTION OF DIALYSIS CATHETER Right 04/14/2019   Procedure: INSERTION OF DIALYSIS CATHETER;  Surgeon: Rosetta Posner, MD;  Location: [place];  Service: Vascular;  Laterality: Right;  . LOWER EXTREMITY ANGIOGRAPHY N/A 07/05/2018   Procedure: LOWER EXTREMITY ANGIOGRAPHY;  Surgeon: Serafina Mitchell, MD;  Location: [place] [facility];  Service: Cardiovascular;  Laterality: N/A;  . PERIPHERAL VASCULAR BALLOON ANGIOPLASTY Left 07/05/2018   Procedure: PERIPHERAL VASCULAR BALLOON ANGIOPLASTY;  Surgeon: Serafina Mitchell, MD;  Location: [place] [facility];  Service: Cardiovascular;  Laterality: Left;  SFA  . STUMP REVISION Left 10/19/2018   Procedure: REVISION LEFT BELOW KNEE AMPUTATION;  Surgeon: [name], MD;  Location: [place];  Service: Orthopedics;  Laterality: Left;  . TUBAL LIGATION  2002    Family History  Problem Relation Age of Onset  . Diabetes Mother   . Diabetes Brother   . Diabetes Daughter   . Diabetes Daughter   . Mental retardation Brother        died from PNA  . Diabetes Maternal Grandmother     Social History:  reports that she quit smoking about 4 years ago. Her smoking use included cigarettes. She started smoking about 5 years ago. She has a 1.00 pack-year smoking history. She has never used smokeless tobacco. She reports current alcohol use. She reports current drug use. Frequency: 4.00 times per week. Drug: Marijuana.  Allergies: No Known Allergies  Medications: I have reviewed the patient's current medications.   Results for orders placed or performed during the hospital encounter of 05/02/19 (from the past 48 hour(s))  Comprehensive metabolic panel     Status: Abnormal   Collection Time: 05/02/19 10:36 AM  Result Value Ref Range   Sodium 138 135 - 145 mmol/L   Potassium 4.0 3.5 - 5.1 mmol/L   Chloride 104 98 - 111 mmol/L   CO2 19  (L) 22 - 32 mmol/L   Glucose, Bld 160 (H) 70 - 99 mg/dL   BUN 35 (H) 6 - 20 mg/dL   Creatinine, Ser 6.29 (H) 0.44 - 1.00 mg/dL   Calcium 7.2 (L) 8.9 - 10.3 mg/dL   Total Protein 7.3 6.5 - 8.1 g/dL   Albumin 3.1 (L) 3.5 - 5.0 g/dL   AST 15 15 - 41 U/L   ALT 11 0 - 44 U/L   Alkaline Phosphatase 111 38 - 126 U/L   Total Bilirubin 0.5 0.3 - 1.2 mg/dL   GFR calc non Af Amer 8 (L) >60 mL/min   GFR calc Af Amer 9 (L) >60 mL/min   Anion gap 15 5 - 15    Comment: Performed at [facility], [address], [place], [place] [address]  CBC with Differential     Status: Abnormal   Collection Time: 05/02/19 10:36 AM  Result Value Ref Range   WBC 5.9 4.0 - 10.5 K/uL   RBC 2.21 (L) 3.87 -  5.11 MIL/uL   Hemoglobin 7.0 (L) 12.0 - 15.0 g/dL    Comment: REPEATED TO VERIFY   HCT 22.2 (L) 36.0 - 46.0 %   MCV 100.5 (H) 80.0 - 100.0 fL   MCH 31.7 26.0 - 34.0 pg   MCHC 31.5 30.0 - 36.0 g/dL   RDW 17.1 (H) 11.5 - 15.5 %   Platelets 314 150 - 400 K/uL   nRBC 0.3 (H) 0.0 - 0.2 %   Neutrophils Relative % 65 %   Neutro Abs 3.9 1.7 - 7.7 K/uL   Lymphocytes Relative 19 %   Lymphs Abs 1.1 0.7 - 4.0 K/uL   Monocytes Relative 6 %   Monocytes Absolute 0.3 0.1 - 1.0 K/uL   Eosinophils Relative 9 %   Eosinophils Absolute 0.5 0.0 - 0.5 K/uL   Basophils Relative 1 %   Basophils Absolute 0.1 0.0 - 0.1 K/uL   Immature Granulocytes 0 %   Abs Immature Granulocytes 0.02 0.00 - 0.07 K/uL    Comment: Performed at [facility], [address], [place], [place] [address]  I-Stat beta hCG blood, ED     Status: None   Collection Time: 05/02/19 10:53 AM  Result Value Ref Range   I-stat hCG, quantitative <5.0 <5 mIU/mL   Comment 3            Comment:   GEST. AGE      CONC.  (mIU/mL)   <=1 WEEK        5 - 50     2 WEEKS       50 - 500     3 WEEKS       100 - 10,000     4 WEEKS     1,000 - 30,000        FEMALE AND NON-PREGNANT FEMALE:     LESS THAN 5 mIU/mL   Urinalysis, Routine w reflex microscopic      Status: Abnormal   Collection Time: 05/02/19  1:00 PM  Result Value Ref Range   Color, Urine YELLOW YELLOW   APPearance HAZY (A) CLEAR   Specific Gravity, Urine 1.016 1.005 - 1.030   pH 5.0 5.0 - 8.0   Glucose, UA NEGATIVE NEGATIVE mg/dL   Hgb urine dipstick SMALL (A) NEGATIVE   Bilirubin Urine NEGATIVE NEGATIVE   Ketones, ur NEGATIVE NEGATIVE mg/dL   Protein, ur >=300 (A) NEGATIVE mg/dL   Nitrite NEGATIVE NEGATIVE   Leukocytes,Ua NEGATIVE NEGATIVE   RBC / HPF 0-5 0 - 5 RBC/hpf   WBC, UA 0-5 0 - 5 WBC/hpf   Bacteria, UA RARE (A) NONE SEEN   Squamous Epithelial / LPF 0-5 0 - 5   Mucus PRESENT    Amorphous Crystal PRESENT     Comment: Performed at [facility], [place] [address] [place], [place] [address]    Dg Chest 2 View  Result Date: 05/02/2019 CLINICAL DATA:  Shortness of breath EXAM: CHEST - 2 VIEW COMPARISON:  04/14/2019 FINDINGS: Right IJ dialysis catheter tip mid right atrial level as before. Stable position. Stable cardiomegaly with trace pleural effusions and basilar streaky opacities compatible with atelectasis. Negative for significant CHF. Left midlung peripheral atelectasis also noted. No pneumothorax. Trachea midline. Slight improvement in basilar aeration compared to 04/14/2019. IMPRESSION: Cardiomegaly with left midlung and bibasilar atelectasis and trace pleural effusions. Electronically Signed   By: Jerilynn Mages.  Shick M.D.   On: 05/02/2019 11:03    ROS: As per H&P, all systems reviewed and  negative. Blood pressure (!) 173/88, pulse 85, temperature 98.6 F (37 C), temperature source Oral, resp. rate 12, weight 73.9 kg, last menstrual period 04/16/2019, SpO2 97 %. Physical examination: General: Not in distress, comfortable Neck: JVP mildly elevated Respiratory: Bibasal some crackles, no wheezing appreciated, Cardiovascular: Regular rate rhythm S1-S2 normal GI: Abdomen soft, nontender Extremities trace edema Vascular: Right IJ TDC site clean.  Left upper extremity has  a dressing applied. Skin: No rash or ulcer Neurology: Alert awake and following commands.  Assessment/Plan: 1 dyspnea likely due to anemia and probably some fluid overload.  Plan for dialysis today with ultrafiltration and blood transfusion.  Patient is being admitted to internal medicine team.  2 ESRD: TTS at [facility]: Regular dialysis per patient's schedule.  She has IJ tunnel catheter for the access.  Hold off on heparin because of severe anemia.  3 Hypertension: BP elevated.  Resume home medication.  Ultrafiltration during dialysis.  4. Anemia of ESRD: A unit of blood transfusion today.  Continue Aranesp, last dose was on 0000000.  5. Metabolic Bone Disease: Monitor phosphorus level.  Recently started on dialysis.  Tonee Silverstein Tanna Furry 05/02/2019, 2:51 PM

## 2019-05-02 NOTE — ED Provider Notes (Signed)
[facility] EMERGENCY DEPARTMENT Provider Note   CSN: [id number] Arrival date & time: 05/02/19  1023     History   Chief Complaint Chief Complaint  Patient presents with  . Shortness of Breath  . Arm Pain    HPI Kathy Frank is a 43 y.o. female with a hx of HIV last RNA quant non detectable CD4 526, T2DM, HTN, GERD, gastroparesis, anemia, L AKA, prior osteomyelitis, & ESRD T/R/Sat (last dialyzed Saturday- started dialysis last week (3 sessions total thus far)) who presents to the ED w/ complaints of LUE pain/swelling & bleeding from fistula site x 2 weeks and worsened dyspnea x 4 days. Patient states that she had her second surgery for planned fistula to the LUE about 2 weeks ago. Since this procedure she has had progressively worsening pain/swelling to the LUE which is primarily at the surgical site but is diffusely to the entire LUE. Pain worse with movement, no alleviating factors. She states she has had frequent bleeding from the surgical site, this has progressively worsened & is occurring daily, notes that it frequently bleeds through dressings & clothing. She was called by dialysis center today and told to go to [facility] for blood transfusion prior to dialysis as she had a low hgb, she states she has had blood transfusions before.  She has that she is having some lightheadedness/dizziness with position changes but no syncopal events. She also reports she has had intermittent shortness of breath for a few months now, seems worse over past 4 days, this is primarily at night when trying to sleep laying flat, she often wakes up short of breath. She is also short of breath w/ exertion. Denies fever, chills, redness, purulent drainage from surgical site, N/V, chest pain, diaphoresis, or syncope. Denies leg pain/swelling, hemoptysis, recent trauma, recent long travel, hormone use, personal hx of cancer, or hx of DVT/PE.   Per chart review patient  Patient had basilic vein  transposition second stage left arm & insertion of dialysis catheter to R neck. 04/14/19 by Dr. Donnetta Hutching      HPI  Past Medical History:  Diagnosis Date  . Below-knee amputation of left lower extremity ([place])   . CKD (chronic kidney disease) stage 5, GFR less than 15 ml/min (HCC) 06/19/2016   06/30/16: Seen by Dr. Posey Pronto [Nephrology]. Suspect etiologies to include diabetes and hypertension though cannot exclude HIV-associated nephropathy. Advised her against from using excessive Goody powder.  . Contact dermatitis 04/21/2016   07/29/16: Skin biopsy performed by [facility] notable for acute spongiotic dermatitis consistent with contact dermatitis.  . Cyclical vomiting    THC induced???  . Depression 06/28/2006   Qualifier: Diagnosis of  By: Riccardo Dubin MD, [place]    . Diabetes mellitus type 2 in obese ([place]) 06/28/1994  . DKA (diabetic ketoacidoses) ([place]) 12/2014  . Dyspnea   . Erosive esophagitis   . Esophageal reflux   . Essential hypertension 11/16/2013  . Gastroparesis    ? diabetic  . GERD (gastroesophageal reflux disease)   . History of blood transfusion   . Human immunodeficiency virus (HIV) disease ([place]) 04/23/2016  . Hypertension   . Moderate nonproliferative diabetic retinopathy of both eyes ([place]) 11/21/2014   11/14/14: Noted on retinal imaging; needs follow-up imaging in 6 months  05/22/16: Noted on retinal imaging again; needs follow-up imaging in 6 months  . Normocytic anemia   . TOBACCO USER 06/28/2006   Qualifier: Diagnosis of  By: Riccardo Dubin MD, Sherren Mocha    .  Type II diabetes mellitus ([place]) 1994   diagnosed around 47  . Wound dehiscence   . Wound infection s/p L transmetatarsal amputation     Patient Active Problem List   Diagnosis Date Noted  . Abdominal pain 02/06/2019  . Hx of BKA, left ([place]) 12/16/2018  . Anemia due to chronic kidney disease   . Renal failure (ARF), acute on chronic ([place]) 11/24/2018  . Fall due to stumbling   . Osteomyelitis ([place]) 10/07/2018   . Dehiscence of amputation stump ([place])   . Moderate protein-calorie malnutrition ([place])   . Adjustment disorder with mixed anxiety and depressed mood 08/15/2018  . CKD (chronic kidney disease), stage V ([place])   . Acute on chronic anemia 07/03/2018  . Fecal incontinence 02/04/2018  . Healthcare maintenance 09/17/2017  . Right leg swelling 02/26/2017  . Superficial ulcerative lesion ([place]) 02/02/2017  . Aortic atherosclerosis ([place]) 09/30/2016  . Diabetic foot ulcer associated with diabetes mellitus due to underlying condition ([place]) 07/08/2016  . Human immunodeficiency virus (HIV) disease ([place]) 04/23/2016  . Gastroparesis   . Moderate nonproliferative diabetic retinopathy of both eyes ([place]) 11/21/2014  . Esophageal reflux   . Microscopic hematuria 10/31/2014  . Hypertension 11/16/2013  . TOBACCO USER 06/28/2006  . Depression 06/28/2006  . Diabetes mellitus type 2 in obese [facility]) 06/28/1994    Past Surgical History:  Procedure Laterality Date  . AMPUTATION Left 07/08/2018   Procedure: AMPUTATION FORTH RAY LEFT FOOT;  Surgeon: [name], MD;  Location: [facility];  Service: Orthopedics;  Laterality: Left;  . AMPUTATION Left 08/09/2018   Procedure: Left Transmetatarsal Amputation;  Surgeon: [name], MD;  Location: [facility];  Service: Orthopedics;  Laterality: Left;  . AMPUTATION Left 10/08/2018   Procedure: LEFT BELOW KNEE AMPUTATION;  Surgeon: [name], MD;  Location: [facility];  Service: Orthopedics;  Laterality: Left;  . AMPUTATION Left 10/28/2018   Procedure: REVISION BELOW KNEE AMPUTATION;  Surgeon: [name], MD;  Location: [facility];  Service: Orthopedics;  Laterality: Left;  . AMPUTATION Left 12/16/2018   Procedure: LEFT ABOVE KNEE AMPUTATION;  Surgeon: [name], MD;  Location: [facility];  Service: Orthopedics;  Laterality: Left;  . AV FISTULA PLACEMENT Left 10/14/2018   Procedure: Arteriovenous (Av) Fistula Creation Left Arm;  Surgeon: [name], MD;  Location: [facility];   Service: Vascular;  Laterality: Left;  . BASCILIC VEIN TRANSPOSITION Left 04/14/2019   Procedure: BASILIC VEIN TRANSPOSITION SECOND STAGE LEFT ARM;  Surgeon: Rosetta Posner, MD;  Location: [facility];  Service: Vascular;  Laterality: Left;  . INSERTION OF DIALYSIS CATHETER Right 04/14/2019   Procedure: INSERTION OF DIALYSIS CATHETER;  Surgeon: Rosetta Posner, MD;  Location: [facility];  Service: Vascular;  Laterality: Right;  . LOWER EXTREMITY ANGIOGRAPHY N/A 07/05/2018   Procedure: LOWER EXTREMITY ANGIOGRAPHY;  Surgeon: Serafina Mitchell, MD;  Location: [facility] CV [facility];  Service: Cardiovascular;  Laterality: N/A;  . PERIPHERAL VASCULAR BALLOON ANGIOPLASTY Left 07/05/2018   Procedure: PERIPHERAL VASCULAR BALLOON ANGIOPLASTY;  Surgeon: Serafina Mitchell, MD;  Location: [facility] CV [facility];  Service: Cardiovascular;  Laterality: Left;  SFA  . STUMP REVISION Left 10/19/2018   Procedure: REVISION LEFT BELOW KNEE AMPUTATION;  Surgeon: [name], MD;  Location: [facility];  Service: Orthopedics;  Laterality: Left;  . TUBAL LIGATION  2002     OB History   No obstetric history on file.      Home Medications    Prior to  Admission medications   Medication Sig Start Date End Date Taking? Authorizing Provider  albuterol (VENTOLIN HFA) 108 (90 Base) MCG/ACT inhaler Inhale 2 puffs into the lungs every 4 (four) hours as needed for wheezing or shortness of breath.    [provider]  amLODipine (NORVASC) 10 MG tablet TAKE 1 TABLET BY MOUTH DAILY. Patient taking differently: Take 10 mg by mouth 2 (two) times a day.  12/26/18   Chundi, Verne Spurr, MD  bictegravir-emtricitabine-tenofovir AF (BIKTARVY) 50-200-25 MG TABS tablet Take 1 tablet by mouth daily. 02/23/19   Michel Bickers, MD  calcitRIOL (ROCALTROL) 0.25 MCG capsule Take 1 capsule (0.25 mcg total) by mouth daily. 10/17/18   [name], MD  calcium acetate (PHOSLO) 667 MG capsule Take 2 capsules (1,334 mg total) by mouth 3 (three) times daily with meals.  10/16/18   [name], MD  citalopram (CELEXA) 20 MG tablet Take two 20 mg tablets (40 mg total) each day for depression. 03/20/19   Marianna Payment, MD  furosemide (LASIX) 40 MG tablet TAKE 1 TABLET (40 MG TOTAL) BY MOUTH 2 (TWO) TIMES DAILY. 12/20/18   Chundi, Verne Spurr, MD  gabapentin (NEURONTIN) 300 MG capsule Take 1 capsule (300 mg total) by mouth 2 (two) times daily. Patient taking differently: Take 300 mg by mouth 2 (two) times daily as needed. amputation 11/17/18   Rayburn, Neta Mends, PA-C  glucose blood (ACCU-CHEK GUIDE) test strip USE TO TEST BLOOD SUGAR 3 TIMES DAILY . Diagnosis code  E11.69, E66.9 07/09/17   Lars Mage, MD  hydrALAZINE (APRESOLINE) 25 MG tablet Take 1 tablet (25 mg total) by mouth 3 (three) times daily. 03/20/19 04/19/19  Marianna Payment, MD  Insulin Glargine (LANTUS) 100 UNIT/ML Solostar Pen Inject 5 Units into the skin at bedtime. 10/16/18   [name], MD  labetalol (NORMODYNE) 300 MG tablet TAKE 1 TABLET (300 MG TOTAL) BY MOUTH 2 (TWO) TIMES DAILY. Patient taking differently: Take 300 mg by mouth 3 (three) times daily.  01/28/19   Chundi, Verne Spurr, MD  liraglutide (VICTOZA) 18 MG/3ML SOPN Inject 0.2 mLs (1.2 mg total) into the skin daily. 02/04/18   Chundi, Verne Spurr, MD  methocarbamol (ROBAXIN) 500 MG tablet Take 1 tablet (500 mg total) by mouth every 6 (six) hours as needed for muscle spasms. 10/21/18   [name], MD  metoCLOPramide (REGLAN) 5 MG tablet Take 1 tablet (5 mg total) by mouth 2 (two) times daily with breakfast and lunch. 12/31/18   Darlin Drop P, PA-C  oxyCODONE-acetaminophen (PERCOCET/ROXICET) 5-325 MG tablet Take 1 tablet by mouth every 6 (six) hours as needed (pain). 04/14/19   Dagoberto Ligas, PA-C  sodium bicarbonate 650 MG tablet Take 2 tablets (1,300 mg total) by mouth 2 (two) times daily. Patient taking differently: Take 650 mg by mouth 2 (two) times daily.  10/16/18   [name], MD    Family History Family History   Problem Relation Age of Onset  . Diabetes Mother   . Diabetes Brother   . Diabetes Daughter   . Diabetes Daughter   . Mental retardation Brother        died from PNA  . Diabetes Maternal Grandmother     Social History Social History   Tobacco Use  . Smoking status: Former Smoker    Packs/day: 0.10    Years: 10.00    Pack years: 1.00    Types: Cigarettes    Start date: 11/17/2013    Quit date: 09/09/2014    Years since quitting: 4.6  .  Smokeless tobacco: Never Used  Substance Use Topics  . Alcohol use: Yes    Comment: rare  . Drug use: Yes    Frequency: 4.0 times per week    Types: Marijuana     Allergies   Patient has no known allergies.   Review of Systems Review of Systems  Constitutional: Negative for chills and fever.  HENT: Negative for congestion, ear pain and sinus pressure.   Respiratory: Positive for shortness of breath. Negative for cough.   Cardiovascular: Negative for chest pain, palpitations and leg swelling.  Gastrointestinal: Negative for abdominal pain, nausea and vomiting.  Musculoskeletal: Positive for myalgias.       +_ for LUE swelling.   Skin: Negative for color change.  Neurological: Positive for dizziness and light-headedness. Negative for syncope, weakness and numbness.  All other systems reviewed and are negative.    Physical Exam Updated Vital Signs BP (!) 148/84   Pulse 86   Temp 98.6 F (37 C) (Oral)   Resp 18   Wt 73.9 kg   LMP 04/16/2019   SpO2 96%   BMI 27.12 kg/m   Physical Exam Vitals signs and nursing note reviewed.  Constitutional:      General: She is not in acute distress.    Appearance: Normal appearance. She is not ill-appearing or toxic-appearing.  HENT:     Head: Normocephalic and atraumatic.  Neck:     Musculoskeletal: Normal range of motion and neck supple.     Comments: No midline tenderness.  Cardiovascular:     Rate and Rhythm: Normal rate and regular rhythm.     Pulses:          Radial pulses  are 2+ on the right side and 2+ on the left side.  Pulmonary:     Effort: Pulmonary effort is normal. No tachypnea or respiratory distress.     Breath sounds: Decreased breath sounds (bases) present. No wheezing.  Musculoskeletal:     Comments: Upper extremities: Patient has 3 surgical incision sites to the L upper inner arm- distal wounds appear to be healing w/skin tissue adhesive. Most proximal wound with some opening. No active bleeding on exam. No significant purulent drainage/warmth/erythema.Palpable thrill. Left upper arm appear somewhat swollen, no edema.  Patient has intact AROM throughout.Tender to palpation from left shoulder distally w/ most prominent pain being to the medial upper arm. NVI distally. Hand/digits are not particularly tender. Compartments are soft.   LLE: S/p AKA. LLE 1+ pitting edema to the RLE.   Skin:    General: Skin is warm and dry.     Capillary Refill: Capillary refill takes less than 2 seconds.  Neurological:     Mental Status: She is alert.     Comments: Alert. Clear speech. Sensation grossly intact to bilateral upper extremities. 5/5 symmetric grip strength. Ambulatory. Able to perform Ok sign, thumbs up, & cross 2nd/3rd digits.   Psychiatric:        Mood and Affect: Mood normal.        Behavior: Behavior normal.        ED Treatments / Results  Labs (all labs ordered are listed, but only abnormal results are displayed) Labs Reviewed  COMPREHENSIVE METABOLIC PANEL - Abnormal; Notable for the following components:      Result Value   CO2 19 (*)    Glucose, Bld 160 (*)    BUN 35 (*)    Creatinine, Ser 6.29 (*)    Calcium 7.2 (*)  Albumin 3.1 (*)    GFR calc non Af Amer 8 (*)    GFR calc Af Amer 9 (*)    All other components within normal limits  CBC WITH DIFFERENTIAL/PLATELET - Abnormal; Notable for the following components:   RBC 2.21 (*)    Hemoglobin 7.0 (*)    HCT 22.2 (*)    MCV 100.5 (*)    RDW 17.1 (*)    nRBC 0.3 (*)    All  other components within normal limits  URINALYSIS, ROUTINE W REFLEX MICROSCOPIC  I-STAT BETA HCG BLOOD, ED ([facility], [facility], [facility] ONLY)    EKG EKG Interpretation  Date/Time:  Tuesday May 02 2019 10:30:21 EDT Ventricular Rate:  88 PR Interval:  152 QRS Duration: 84 QT Interval:  416 QTC Calculation: 503 R Axis:   98 Text Interpretation:  Normal sinus rhythm Rightward axis Prolonged QT Abnormal ECG Confirmed by Gerlene Fee [phone]) on 05/02/2019 3:19:33 PM   Radiology Dg Chest 2 View  Result Date: 05/02/2019 CLINICAL DATA:  Shortness of breath EXAM: CHEST - 2 VIEW COMPARISON:  04/14/2019 FINDINGS: Right IJ dialysis catheter tip mid right atrial level as before. Stable position. Stable cardiomegaly with trace pleural effusions and basilar streaky opacities compatible with atelectasis. Negative for significant CHF. Left midlung peripheral atelectasis also noted. No pneumothorax. Trachea midline. Slight improvement in basilar aeration compared to 04/14/2019. IMPRESSION: Cardiomegaly with left midlung and bibasilar atelectasis and trace pleural effusions. Electronically Signed   By: Jerilynn Mages.  Shick M.D.   On: 05/02/2019 11:03    Procedures Procedures (including critical care time)  Medications Ordered in ED Medications  fentaNYL (SUBLIMAZE) injection 50 mcg (has no administration in time range)  Chlorhexidine Gluconate Cloth 2 % PADS 6 each (has no administration in time range)     Initial Impression / Assessment and Plan / ED Course  I have reviewed the triage vital signs and the nursing notes.  Pertinent labs & imaging results that were available during my care of the patient were reviewed by me and considered in my medical decision making (see chart for details).   Patient presents to the emergency department with complaints of left upper extremity swelling/pain and bleeding from surgical site status post basilic vein transposition second stage left arm & insertion of dialysis catheter to R  neck 04/14/19 by Dr. Donnetta Hutching, she also complains of acute on chronic shortness of breath which is been occurring for the past few months, worse over the past few days.  Patient nontoxic-appearing, no apparent distress, vitals stable with exception of somewhat elevated blood pressure.   Left upper extremity: On exam 2 of her incision sites appear to be healing well, one appears somewhat open which is where the bleeding has been coming from per patient report, no active bleeding currently, no erythema/warmth, however there is some swelling present as well as tenderness to palpation most focally to the incision area but also to the diffuse left upper extremity.  No obvious infection. She is neurovascularly intact distally.  Will discuss with vascular surgery regarding recommendations in terms of assessment and imaging.   15:15: CONSULT: Discussed with OR nurse working with vascular surgeon Dr. Scot Dock- will come to the ED to see the patient.   Dr. Scot Dock has seen patient-has redressed left upper extremity site, did not feel that evacuation of hematoma was necessary currently, continue monitoring, has follow-up appointment in the office for later this week.  Shortness of breath: Appears to be acute on chronic, she was not dialyzed  today as scheduled, also has not had a blood transfusion which she was instructed to have this morning.  She does not appear to be in respiratory distress, ---> chest x-ray shows cardiomegaly with some trace pleural effusions, anemic with a hemoglobin 7.0 hematocrit 22.2, this is decreased some from most recent labs on record but she is chronically anemic.   Given patient is new to dialysis, she has some mild signs of fluid overload, she is fairly symptomatic with orthopnea/DOE/PND & she does have a degree of symptomatic anemia which needs transfusion feel that patient would be further served admitted to the hospital for observation, transfusion, and dialysis.  14:21: CONSULT:  Discussed with nephrologist Dr. Carolin Sicks- states if patient has any signs of fluid overload would recommend transfusion during dialysis to monitor fluid balance, nephrology will see patient in consultation.  15:10: CONSULT: Discussed w/ internal medicine service- accepts admission.   Findings and plan of care discussed with supervising physician Dr. Sedonia Small who has evaluated patient & is in agreement.   Kathy Frank was evaluated in Emergency Department on 05/02/2019 for the symptoms described in the history of present illness. He/she was evaluated in the context of the global COVID-19 pandemic, which necessitated consideration that the patient might be at risk for infection with the SARS-CoV-2 virus that causes COVID-19. Institutional protocols and algorithms that pertain to the evaluation of patients at risk for COVID-19 are in a state of rapid change based on information released by regulatory bodies including the CDC and federal and state organizations. These policies and algorithms were followed during the patient's care in the ED.   Final Clinical Impressions(s) / ED Diagnoses   Final diagnoses:  Shortness of breath  Anemia, unspecified type    ED Discharge Orders    None       Amaryllis Dyke, PA-C 05/02/19 1527    Maudie Flakes, MD 05/05/19 [phone]

## 2019-05-02 NOTE — ED Triage Notes (Signed)
Pt in with c/o sob and L arm pain after getting new L upper arm fistula 2 wks ago. Denies any fever or cough, states the fistula site has been bleeding ever since placement. Was supposed to go to [facility] and get blood transfusion today but felt "too bad" to go. L arm swollen and red. T-Th-Sat, last dialyzed Sat.

## 2019-05-02 NOTE — ED Notes (Addendum)
Paged rounding physician for pain medication; pt's pain level unchanged at 8. Will wait for further orders

## 2019-05-02 NOTE — ED Notes (Signed)
Got patient into a gown on the monitor patient is resting with call bell in reach 

## 2019-05-02 NOTE — Consult Note (Signed)
Patient name: Kathy Frank MRN: [id number] DOB: [birth year] Sex: female  REASON FOR CONSULT:   Pain and swelling left arm  HPI:   Kathy Frank is a pleasant 43 y.o. female who had placement of a right IJ tunneled dialysis catheter and a left second stage basilic vein transposition by Dr. Sherren Mocha Early on 04/14/2019.  She presented to the emergency department today because of shortness of breath.  She also was complaining of some left arm swelling and occasional drainage from her upper arm incision since surgery.  She denies fever or chills.  She dialyzes on Tuesdays Thursdays and Saturdays.  She has a functioning tunneled dialysis catheter.  No current facility-administered medications for this encounter.    Current Outpatient Medications  Medication Sig Dispense Refill  . albuterol (VENTOLIN HFA) 108 (90 Base) MCG/ACT inhaler Inhale 2 puffs into the lungs every 4 (four) hours as needed for wheezing or shortness of breath.    Marland Kitchen amLODipine (NORVASC) 10 MG tablet TAKE 1 TABLET BY MOUTH DAILY. (Patient taking differently: Take 10 mg by mouth 2 (two) times a day. ) 90 tablet 0  . bictegravir-emtricitabine-tenofovir AF (BIKTARVY) 50-200-25 MG TABS tablet Take 1 tablet by mouth daily. 30 tablet 11  . calcitRIOL (ROCALTROL) 0.25 MCG capsule Take 1 capsule (0.25 mcg total) by mouth daily. 30 capsule 1  . calcium acetate (PHOSLO) 667 MG capsule Take 2 capsules (1,334 mg total) by mouth 3 (three) times daily with meals. 180 capsule 2  . citalopram (CELEXA) 20 MG tablet Take two 20 mg tablets (40 mg total) each day for depression. 60 tablet 0  . furosemide (LASIX) 40 MG tablet TAKE 1 TABLET (40 MG TOTAL) BY MOUTH 2 (TWO) TIMES DAILY. 180 tablet 0  . gabapentin (NEURONTIN) 300 MG capsule Take 1 capsule (300 mg total) by mouth 2 (two) times daily. (Patient taking differently: Take 300 mg by mouth 2 (two) times daily as needed. amputation) 90 capsule 3  . glucose blood (ACCU-CHEK GUIDE) test strip USE TO  TEST BLOOD SUGAR 3 TIMES DAILY . Diagnosis code  E11.69, E66.9 100 each 11  . hydrALAZINE (APRESOLINE) 25 MG tablet Take 1 tablet (25 mg total) by mouth 3 (three) times daily. 90 tablet 2  . Insulin Glargine (LANTUS) 100 UNIT/ML Solostar Pen Inject 5 Units into the skin at bedtime. 3 mL 0  . labetalol (NORMODYNE) 300 MG tablet TAKE 1 TABLET (300 MG TOTAL) BY MOUTH 2 (TWO) TIMES DAILY. (Patient taking differently: Take 300 mg by mouth 3 (three) times daily. ) 60 tablet 0  . liraglutide (VICTOZA) 18 MG/3ML SOPN Inject 0.2 mLs (1.2 mg total) into the skin daily. 3 mL 1  . methocarbamol (ROBAXIN) 500 MG tablet Take 1 tablet (500 mg total) by mouth every 6 (six) hours as needed for muscle spasms. 20 tablet 0  . metoCLOPramide (REGLAN) 5 MG tablet Take 1 tablet (5 mg total) by mouth 2 (two) times daily with breakfast and lunch. 20 tablet 0  . oxyCODONE-acetaminophen (PERCOCET/ROXICET) 5-325 MG tablet Take 1 tablet by mouth every 6 (six) hours as needed (pain). 20 tablet 0  . sodium bicarbonate 650 MG tablet Take 2 tablets (1,300 mg total) by mouth 2 (two) times daily. (Patient taking differently: Take 650 mg by mouth 2 (two) times daily. ) 120 tablet 2    REVIEW OF SYSTEMS:  [X]  denotes positive finding, [ ]  denotes negative finding Vascular    Leg swelling    Cardiac    Chest  pain or chest pressure:    Shortness of breath upon exertion:    Short of breath when lying flat:    Irregular heart rhythm:    Constitutional    Fever or chills:     PHYSICAL EXAM:   Vitals:   05/02/19 1032 05/02/19 1034 05/02/19 1211  BP:  (!) 148/84 (!) 173/88  Pulse:  86 85  Resp:  18 12  Temp:  98.6 F (37 C)   TempSrc:  Oral   SpO2:  96% 97%  Weight: 73.9 kg      GENERAL: The patient is a well-nourished female, in no acute distress. The vital signs are documented above. CARDIOVASCULAR: There is a regular rate and rhythm. PULMONARY: There is good air exchange bilaterally without wheezing or rales. Her  fistula in the left upper arm has an excellent thrill. She has a palpable left radial pulse.   She has moderate swelling in the left upper arm as documented below.  Left upper arm:     DATA:   Her potassium is 4.0.  MEDICAL ISSUES:   S/P LEFT SECOND STAGE BASILIC VEIN TRANSPOSITION: Her basilic vein transposition has an excellent thrill.  She has moderate swelling in the left upper arm likely related to tunneling is there would be no anastomosis here.  I have instructed her to keep a dressing on the wound as long as it is draining.  This point I did not think evacuation of the hematoma was necessary.  However if the swelling increases or she develops increased drainage she may require a washout of her upper arm incision.  She already has an appointment in the office and will keep that appointment.  Deitra Mayo [facility] [phone]

## 2019-05-02 NOTE — ED Notes (Signed)
Pt to bathroom via wheelchair.

## 2019-05-02 NOTE — H&P (Signed)
Date: 05/02/2019               Patient Name:  Kathy Frank MRN: [id number]  DOB: 03/11/1976 Age / Sex: 43 y.o., female   PCP: Lars Mage, MD              Medical Service: Internal Medicine Teaching Service              Attending Physician: Dr. Aldine Contes, MD    First Contact: Flonnie Hailstone, MS4 Pager: [phone]  Second Contact: Dr. Georgia Lopes Pager: [phone]            After Hours (After 5p/  First Contact Pager: [phone]  weekends / holidays): Second Contact Pager: [phone]   Chief Complaint: Shortness of Breath  History of Present Illness: Ms. Burgener is a 43yo female h/o hypertension, HIV, tobacco abuse, ESRD on HD TTS at [facility] who recently started on dialysis 04/25/19 who presented with dyspnea and generalized weakness. Patient had a hemoglobin close to 6 at outpatient center; she was due a blood transfusion today. Patient presented to ED due to a week of shortness of breath making it difficult to sleep at night. She also reports being worried about her fistula as it has been "leaking blood." She also describes being weak for the past weak. Her last dialysis treatment was on Saturday. In the ER, patient hemoglobin 7 and potassium 4.  No chest pain, nausea, vomiting, headache, fevers or dizziness. Patient states in the past week she stopped taking labetalol and furosemide and started taking hydralazine. Patient evaluated by vascular surgery and nephrology in ED. Vascular surgery recommends no inpatient interventions related to fistula; outpatient follow-up schueduled. Nephrology recommended continuing home medications in addition to dialysis w/ultrafiltration and blood transfusion as planned at outpatient dialysis center.  Meds:  Current Meds  Medication Sig  . albuterol (VENTOLIN HFA) 108 (90 Base) MCG/ACT inhaler Inhale 2 puffs into the lungs every 4 (four) hours as needed for wheezing or shortness of breath.  Marland Kitchen amLODipine (NORVASC) 10 MG tablet TAKE 1  TABLET BY MOUTH DAILY. (Patient taking differently: Take 10 mg by mouth every evening. )  . bictegravir-emtricitabine-tenofovir AF (BIKTARVY) 50-200-25 MG TABS tablet Take 1 tablet by mouth daily.  . calcitRIOL (ROCALTROL) 0.25 MCG capsule Take 1 capsule (0.25 mcg total) by mouth daily.  . calcium acetate (PHOSLO) 667 MG capsule Take 2 capsules (1,334 mg total) by mouth 3 (three) times daily with meals.  . citalopram (CELEXA) 20 MG tablet Take two 20 mg tablets (40 mg total) each day for depression. (Patient taking differently: Take 20 mg by mouth daily. )  . glucose blood (ACCU-CHEK GUIDE) test strip USE TO TEST BLOOD SUGAR 3 TIMES DAILY . Diagnosis code  E11.69, E66.9  . Insulin Glargine (LANTUS) 100 UNIT/ML Solostar Pen Inject 5 Units into the skin at bedtime.  . liraglutide (VICTOZA) 18 MG/3ML SOPN Inject 0.2 mLs (1.2 mg total) into the skin daily.  Marland Kitchen oxyCODONE-acetaminophen (PERCOCET/ROXICET) 5-325 MG tablet Take 1 tablet by mouth every 6 (six) hours as needed (pain).  . [DISCONTINUED] gabapentin (NEURONTIN) 300 MG capsule Take 1 capsule (300 mg total) by mouth 2 (two) times daily. (Patient taking differently: Take 300 mg by mouth 2 (two) times daily as needed. amputation)  . [DISCONTINUED] methocarbamol (ROBAXIN) 500 MG tablet Take 1 tablet (500 mg total) by mouth every 6 (six) hours as needed for muscle spasms.   Allergies: Allergies as of 05/02/2019  . (No Known Allergies)  Past Medical History:  Diagnosis Date  . Below-knee amputation of left lower extremity ([place])   . CKD (chronic kidney disease) stage 5, GFR less than 15 ml/min (HCC) 06/19/2016   06/30/16: Seen by Dr. Posey Pronto [Nephrology]. Suspect etiologies to include diabetes and hypertension though cannot exclude HIV-associated nephropathy. Advised her against from using excessive Goody powder.  . Contact dermatitis 04/21/2016   07/29/16: Skin biopsy performed by [facility] notable for acute spongiotic dermatitis  consistent with contact dermatitis.  . Cyclical vomiting    THC induced???  . Depression 06/28/2006   Qualifier: Diagnosis of  By: Riccardo Dubin MD, [place]    . Diabetes mellitus type 2 in obese ([place]) 06/28/1994  . DKA (diabetic ketoacidoses) ([place]) 12/2014  . Dyspnea   . Erosive esophagitis   . Esophageal reflux   . Essential hypertension 11/16/2013  . Gastroparesis    ? diabetic  . GERD (gastroesophageal reflux disease)   . History of blood transfusion   . Human immunodeficiency virus (HIV) disease ([place]) 04/23/2016  . Hypertension   . Moderate nonproliferative diabetic retinopathy of both eyes ([place]) 11/21/2014   11/14/14: Noted on retinal imaging; needs follow-up imaging in 6 months  05/22/16: Noted on retinal imaging again; needs follow-up imaging in 6 months  . Normocytic anemia   . TOBACCO USER 06/28/2006   Qualifier: Diagnosis of  By: Riccardo Dubin MD, Sherren Mocha    . Type II diabetes mellitus ([place]) 1994   diagnosed around 23  . Wound dehiscence   . Wound infection s/p L transmetatarsal amputation    Family History:  Problem Relation Age of Onset  . Diabetes Mother   . Diabetes Brother   . Diabetes Daughter   . Diabetes Daughter   . Mental retardation Brother        died from PNA  . Diabetes Maternal Grandmother    Social History:  Patient reports that she quit smoking cigarettes in 2016. She has an 8 pack-year smoking history as she smoked 1 pack per day from 2008-2016. She has never used smokeless tobacco. She reports minimal, current alcohol use. She drinks one drink or less per week. She reports current use of marijuana 4 times per week that she states helps her appetite. She lives in [place] and is unemployed due to her disability related to her amputations.    Review of Systems: A complete ROS was negative except as per HPI.   Physical Exam: Blood pressure (!) 170/80, pulse 87, temperature 98.6 F (37 C), temperature source Oral, resp. rate 18, weight 73.9 kg, last  menstrual period 04/16/2019, SpO2 97 %. General: NAD Eyes: PERRL, EOM Neck: JVD not observed Respiratory: Minimal crackles in bilateral lung bases, no wheezing Cardiovascular: RRR, S1, S2, No m/r/g GI: Abdomen soft, nontender Extremities: trace edema Vascular: Right IJ TDC site clean. Dressing applied to left upper extremity. Skin: No rashes or ulcers Neurology: Alert and follows commands  Assessment & Plan by Problem: Active Problems:   ESRD on hemodialysis [facility]): Shortness of breath reported likely due to volume overload from ESRD. IJ tunnel catheter for dialysis; dialysis per patient schedule (TTS). Patient was evaluated by vascular surgery who will follow-up with her fistula as an outpatient. -Nephrology to dialyze today and consider repeat tomorrow, appreciate their assistance -Consult to renal, appreciate recs  -CMP in am  Acute on chronic anemia: Hgb 7.0. Patient's anemia is most likely 2/2 to ESRD. She does have a history of heavy menstrual bleeding that has complicated this course from time  to time. Patient had a blood transfusion planned for today at her dialysis center. Anemia likely contributing to patient's reported weakness.  -Transfuse 1 unit, pRBCs during HD per nephrology   -Agree with continuing Aranesp per nephrology  -CBC  -INR  -APTT  Hypertension: BP elevated, likely contributed to by volume overload related to ESRD. No headaches or other symptoms related to hypertension. -Continue amlodipine 10mg  daily -Continue hydralazine 25mg  TID -Ultrafiltration during dialysis -Consult to renal, appreciate recs  Metabolic bone disease: Phosphorus 5.5 on last recorded value 03/20/19.  -Continue calcium acetate daily  -Serum phosphorus, 09/23 AM  -CMP  DMII: Stable, A1c of little utility in setting of ESRD. Will monitor CBG's daily.  -Continue Lantus 5U QHS -SSI sen w/ CBG WC  HIV: Stable. Continue BIktarvy.  Diet: Renal Code: Full DVT PPX: SCDs due to bleed?  Fluids: N/A Dispo: Admit patient to Observation with expected length of stay less than 2 midnights.  Signed: Flonnie Hailstone MSIV   Attestation for Student Documentation:  I personally was present and performed or re-performed the history, physical exam and medical decision-making activities of this service and have verified that the service and findings are accurately documented in the student's note. I have made minor adjustments to the record above as indicated to preserve the accuracy and appropriateness of the chart record and evaluation.  [name], MD 05/02/2019, 5:22 PM

## 2019-05-03 DIAGNOSIS — Z720 Tobacco use: Secondary | ICD-10-CM

## 2019-05-03 DIAGNOSIS — B20 Human immunodeficiency virus [HIV] disease: Secondary | ICD-10-CM

## 2019-05-03 DIAGNOSIS — N186 End stage renal disease: Secondary | ICD-10-CM | POA: Diagnosis not present

## 2019-05-03 DIAGNOSIS — R21 Rash and other nonspecific skin eruption: Secondary | ICD-10-CM

## 2019-05-03 DIAGNOSIS — F329 Major depressive disorder, single episode, unspecified: Secondary | ICD-10-CM

## 2019-05-03 DIAGNOSIS — I120 Hypertensive chronic kidney disease with stage 5 chronic kidney disease or end stage renal disease: Secondary | ICD-10-CM | POA: Diagnosis not present

## 2019-05-03 DIAGNOSIS — E8770 Fluid overload, unspecified: Secondary | ICD-10-CM | POA: Diagnosis not present

## 2019-05-03 DIAGNOSIS — E1122 Type 2 diabetes mellitus with diabetic chronic kidney disease: Secondary | ICD-10-CM | POA: Diagnosis not present

## 2019-05-03 DIAGNOSIS — E889 Metabolic disorder, unspecified: Secondary | ICD-10-CM

## 2019-05-03 LAB — HEMOGLOBIN AND HEMATOCRIT, BLOOD
HCT: 25.9 % — ABNORMAL LOW (ref 36.0–46.0)
Hemoglobin: 8.6 g/dL — ABNORMAL LOW (ref 12.0–15.0)

## 2019-05-03 LAB — COMPREHENSIVE METABOLIC PANEL
ALT: 9 U/L (ref 0–44)
AST: 13 U/L — ABNORMAL LOW (ref 15–41)
Albumin: 2.8 g/dL — ABNORMAL LOW (ref 3.5–5.0)
Alkaline Phosphatase: 107 U/L (ref 38–126)
Anion gap: 13 (ref 5–15)
BUN: 38 mg/dL — ABNORMAL HIGH (ref 6–20)
CO2: 20 mmol/L — ABNORMAL LOW (ref 22–32)
Calcium: 6.9 mg/dL — ABNORMAL LOW (ref 8.9–10.3)
Chloride: 105 mmol/L (ref 98–111)
Creatinine, Ser: 6.7 mg/dL — ABNORMAL HIGH (ref 0.44–1.00)
GFR calc Af Amer: 8 mL/min — ABNORMAL LOW (ref >60)
GFR calc non Af Amer: 7 mL/min — ABNORMAL LOW (ref >60)
Glucose, Bld: 108 mg/dL — ABNORMAL HIGH (ref 70–99)
Potassium: 4 mmol/L (ref 3.5–5.1)
Sodium: 138 mmol/L (ref 135–145)
Total Bilirubin: 0.4 mg/dL (ref 0.3–1.2)
Total Protein: 6.5 g/dL (ref 6.5–8.1)

## 2019-05-03 LAB — APTT: aPTT: 36 seconds (ref 24–36)

## 2019-05-03 LAB — CBC
HCT: 20.2 % — ABNORMAL LOW (ref 36.0–46.0)
Hemoglobin: 6.5 g/dL — CL (ref 12.0–15.0)
MCH: 32.2 pg (ref 26.0–34.0)
MCHC: 32.2 g/dL (ref 30.0–36.0)
MCV: 100 fL (ref 80.0–100.0)
Platelets: 280 10*3/uL (ref 150–400)
RBC: 2.02 MIL/uL — ABNORMAL LOW (ref 3.87–5.11)
RDW: 17.2 % — ABNORMAL HIGH (ref 11.5–15.5)
WBC: 6.4 10*3/uL (ref 4.0–10.5)
nRBC: 0.3 % — ABNORMAL HIGH (ref 0.0–0.2)

## 2019-05-03 LAB — PREPARE RBC (CROSSMATCH)

## 2019-05-03 LAB — PROTIME-INR
INR: 1.1 (ref 0.8–1.2)
Prothrombin Time: 13.8 seconds (ref 11.4–15.2)

## 2019-05-03 LAB — GLUCOSE, CAPILLARY
Glucose-Capillary: 119 mg/dL — ABNORMAL HIGH (ref 70–99)
Glucose-Capillary: 171 mg/dL — ABNORMAL HIGH (ref 70–99)

## 2019-05-03 LAB — PHOSPHORUS: Phosphorus: 4.3 mg/dL (ref 2.5–4.6)

## 2019-05-03 MED ORDER — INFLUENZA VAC SPLIT QUAD 0.5 ML IM SUSY: 0.5000 mL | PREFILLED_SYRINGE | INTRAMUSCULAR | Status: DC

## 2019-05-03 MED ORDER — CHLORHEXIDINE GLUCONATE CLOTH 2 % EX PADS
6.0000 | MEDICATED_PAD | Freq: Every day | CUTANEOUS | Status: DC
  Administered 2019-05-03: 6 via TOPICAL

## 2019-05-03 MED ORDER — HYDRALAZINE HCL 25 MG PO TABS: 25.0000 mg | ORAL_TABLET | Freq: Three times a day (TID) | ORAL | 2 refills | Status: DC

## 2019-05-03 MED ORDER — PENTAFLUOROPROP-TETRAFLUOROETH EX AERO: 1.0000 "application " | INHALATION_SPRAY | CUTANEOUS | Status: DC | PRN

## 2019-05-03 MED ORDER — GABAPENTIN 300 MG PO CAPS: 300.0000 mg | ORAL_CAPSULE | Freq: Every day | ORAL | 0 refills | Status: DC | PRN

## 2019-05-03 MED ORDER — ALTEPLASE 2 MG IJ SOLR: 2.0000 mg | Freq: Once | INTRAMUSCULAR | Status: DC | PRN

## 2019-05-03 MED ORDER — DIPHENHYDRAMINE HCL 50 MG PO TABS: 25.0000 mg | ORAL_TABLET | Freq: Four times a day (QID) | ORAL | 0 refills | Status: DC | PRN

## 2019-05-03 MED ORDER — FLUOCINONIDE-E 0.05 % EX CREA: 1.0000 "application " | TOPICAL_CREAM | Freq: Two times a day (BID) | CUTANEOUS | 0 refills | Status: DC

## 2019-05-03 MED ORDER — HEPARIN SODIUM (PORCINE) 1000 UNIT/ML IJ SOLN
INTRAMUSCULAR | Status: AC
  Filled 2019-05-03: qty 4

## 2019-05-03 MED ORDER — DARBEPOETIN ALFA 200 MCG/0.4ML IJ SOSY: 200.0000 ug | PREFILLED_SYRINGE | INTRAMUSCULAR | Status: DC

## 2019-05-03 MED ORDER — LIDOCAINE-PRILOCAINE 2.5-2.5 % EX CREA: 1.0000 "application " | TOPICAL_CREAM | CUTANEOUS | Status: DC | PRN

## 2019-05-03 MED ORDER — HEPARIN SODIUM (PORCINE) 1000 UNIT/ML DIALYSIS
1000.0000 [IU] | INTRAMUSCULAR | Status: DC | PRN
  Administered 2019-05-03: 1000 [IU] via INTRAVENOUS_CENTRAL
  Filled 2019-05-03 (×2): qty 1

## 2019-05-03 MED ORDER — LIDOCAINE HCL (PF) 1 % IJ SOLN: 5.0000 mL | INTRAMUSCULAR | Status: DC | PRN

## 2019-05-03 MED ORDER — SODIUM CHLORIDE 0.9 % IV SOLN: 100.0000 mL | INTRAVENOUS | Status: DC | PRN

## 2019-05-03 MED ORDER — HEPARIN SODIUM (PORCINE) 1000 UNIT/ML IJ SOLN
INTRAMUSCULAR | Status: AC
  Filled 2019-05-03: qty 1

## 2019-05-03 MED ORDER — SODIUM CHLORIDE 0.9% IV SOLUTION: Freq: Once | INTRAVENOUS | Status: DC

## 2019-05-03 MED FILL — hydrALAZINE HCL 25 MG TABS: 25 | 30 days supply | Qty: 90 | Fill #0

## 2019-05-03 NOTE — Progress Notes (Signed)
  Date: 05/03/2019  Patient name: Kathy Frank  Medical record number: [id number]  Date of birth: 17-Apr-1976   I have seen and evaluated Kathy Frank and discussed their care with the Residency Team.  In brief, patient is a 43 year old female with a past medical history of hypertension, HIV, tobacco abuse, ESRD on hemodialysis (started on April 25, 2019) who presented to ED with worsening shortness of breath and weakness.  Patient presented to the ED with progressively worsening shortness of breath over the last week associated with orthopnea and dyspnea on exertion.  She is also concerned that her fistula had been bleeding and was found to have decreased hemoglobin of 6 on outpatient labs.  Patient's last hemodialysis treatment was on Saturday.  In the ED patient hemoglobin was 7.  No chest pain, no palpitations, no diaphoresis, no lightheadedness, no syncope, no focal weakness, no tingling or numbness, no nausea or vomiting, no abdominal pain, no diarrhea.  Patient does state that she has stopped taking her labetalol and furosemide over the last week and started taking hydralazine.  Today patient states that her shortness of breath is slowly improving but she still has some shortness of breath and weakness.  PMHx, Fam Hx, and/or Soc Hx : As per resident admit note  Vitals:   05/03/19 1130 05/03/19 1150  BP: (!) 170/76 (!) 180/94  Pulse: 92 91  Resp:  17  Temp:  98.3 F (36.8 C)  SpO2:     General: Awake, alert oriented x3, NAD CVS: Regular rate and rhythm, normal heart sounds Lungs: CTA bilaterally Abdomen: Soft, nontender, nondistended, normoactive bowel sounds Extremities: Trace bilateral lower extremity edema noted, no tenderness to palpation Neuro: Oriented x3, no focal deficit noted HEENT: Normocephalic, atraumatic Psych: Normal mood and affect  Assessment and Plan: I have seen and evaluated the patient as outlined above. I agree with the formulated Assessment and Plan  as detailed in the residents' note, with the following changes:   1.  Fluid overload in setting of ESRD: -Patient presented to ED with progressive shortness of breath likely secondary to fluid overload in the setting of recent initiation of hemodialysis. -Nephrology follow-up recommendations appreciated -Patient receiving hemodialysis this morning with an ultrafiltration goal of 3 to 4 L per nephrology -I suspect that the patient should be able to receive hemodialysis today and be discharged home if feeling better.  She will resume her normal hemodialysis schedule as an outpatient tomorrow at her hemodialysis center -No further work-up at this time  2.  Worsening anemia secondary to ESRD: -Patient noted to have a hemoglobin of 6.5 today.  Patient to receive 1 unit PRBC at hemodialysis today -Patient will need to have follow-up as an outpatient for repeat CBC -Continue with Aranesp per nephrology -I suspect that patient worsening anemia is also contributed to her shortness of breath and weakness and she should feel better after her transfusion today. -No further work-up at this time -Patient stable for DC home today  Aldine Contes, MD 9/23/202012:03 PM

## 2019-05-03 NOTE — Procedures (Signed)
Patient was seen on dialysis and the procedure was supervised.  BFR 400  Via TDC BP is  159/72. UF goal 3-4 kg.   Patient appears to be tolerating treatment well.  Kathy Frank Kathy Frank 05/03/2019

## 2019-05-03 NOTE — Discharge Summary (Signed)
Name: Kathy Frank DOB: April 11, 1976 43 y.o. PCP: Lars Mage, MD  Date of Admission: 05/02/2019 10:26 AM Date of Discharge: 05/03/2019 Attending Physician: Dr. Dareen Piano  Discharge Diagnosis: 1. ESRD 2. Anemia 3. Hypertension 4. Metabolic Bone Disease  Discharge Medications: Allergies as of 05/03/2019   No Known Allergies     Medication List    TAKE these medications   albuterol 108 (90 Base) MCG/ACT inhaler Commonly known as: VENTOLIN HFA Inhale 2 puffs into the lungs every 4 (four) hours as needed for wheezing or shortness of breath.   amLODipine 10 MG tablet Commonly known as: NORVASC TAKE 1 TABLET BY MOUTH DAILY. What changed: when to take this   bictegravir-emtricitabine-tenofovir AF 50-200-25 MG Tabs tablet Commonly known as: BIKTARVY Take 1 tablet by mouth daily.   calcitRIOL 0.25 MCG capsule Commonly known as: ROCALTROL Take 1 capsule (0.25 mcg total) by mouth daily.   calcium acetate 667 MG capsule Commonly known as: PHOSLO Take 2 capsules (1,334 mg total) by mouth 3 (three) times daily with meals.   citalopram 20 MG tablet Commonly known as: CeleXA Take two 20 mg tablets (40 mg total) each day for depression. What changed:   how much to take  how to take this  when to take this  additional instructions   diphenhydrAMINE 50 MG tablet Commonly known as: BENADRYL Take 0.5 tablets (25 mg total) by mouth every 6 (six) hours as needed for itching (As needed for itching).   fluocinonide-emollient 0.05 % cream Commonly known as: LIDEX-E Apply 1 application topically 2 (two) times daily. DO NOT APPLY IN FACE.   gabapentin 300 MG capsule Commonly known as: NEURONTIN Take 1 capsule (300 mg total) by mouth daily as needed. What changed:   when to take this  reasons to take this   glucose blood test strip Commonly known as: Accu-Chek Guide USE TO TEST BLOOD SUGAR 3 TIMES DAILY . Diagnosis code  E11.69, E66.9   hydrALAZINE 25  MG tablet Commonly known as: APRESOLINE Take 1 tablet (25 mg total) by mouth 3 (three) times daily.   Insulin Glargine 100 UNIT/ML Solostar Pen Commonly known as: LANTUS Inject 5 Units into the skin at bedtime.   liraglutide 18 MG/3ML Sopn Commonly known as: Victoza Inject 0.2 mLs (1.2 mg total) into the skin daily.   oxyCODONE-acetaminophen 5-325 MG tablet Commonly known as: PERCOCET/ROXICET Take 1 tablet by mouth every 6 (six) hours as needed (pain).       Disposition and follow-up:   [name] was discharged from [facility] in Stable condition.  At the hospital follow up visit please address:  1. ESRD: Evaluate patient for weakness and shortness of breath.  LLE Rash: Please consider a skin biopsy due to right lower leg skin condition. Anemia: 2/2 ESRD, baseline ~7.5, please consider a CBC to confirm stability  2.  Labs / imaging needed at time of follow-up: CBC to verify hemoglobin remains above 7.0 after transfusion.   Follow-up Appointments: [place]- 10:15am on 05/10/19 Dialysis TTS at outpatient dialysis center  Hospital Course by problem list: ESRD on hemodialysis  Anemia: Patient presented with shortness of breath and weakness likely due to volume overload and anemia secondary to ESRD. Patient received hemodialysis and 1 unit pRBCs while admitted. Prior to discharge, patient stated that she felt less weak and short of breath. Patient will continue on her previous dialysis schedule of Tuesday, Thursday and Saturday per nephrology recommendations. Repeat  CBC will be obtained at follow-up appointment week of 09/28 at PCP office. Patient will receive Aranesp at her dialysis appointment tomorrow.  Hypertension: BP elevated on admission likely secondary to volume overload from ESRD. Patient received hemodialysis with ultrafiltration of 4.5 liters while admitted. Patient continued on home blood pressure medications at  discharge.   Metabolic Bone Disease: Phosphorus within target range at 4.3. Hemodialysis will be continued with phosphorus monitoring as per renal recommendations.  RLE Rash: Cause of rash is unclear. Likely due to ESRD and may be contributed to by HIV. Patient described rash as pruritic and recurrent. She was discharged with fluocinonide cream and Benadryl to control pruritus. Patient was advised of need for skin biopsy to identify cause of rash.  Stable Medical Conditions: Diabetes Mellitus, type II: Stable. Continued on Lantus 5U QHS while admitted with SSI as needed. Discharged on previous insulin regimen.  HIV: Stable. Continued on Biktarvy.  Depression: Stable. Continued on Celexa.  Discharge Vitals:   BP (!) 161/88 (BP Location: Right Arm)   Pulse 90   Temp 98 F (36.7 C) (Oral)   Resp 17   Wt 70.3 kg   LMP 04/16/2019   SpO2 97%   BMI 25.79 kg/m   Pertinent Labs, Studies, and Procedures:  Lab Results  Component Value Date   CREATININE 6.70 (H) 05/03/2019   BUN 38 (H) 05/03/2019   NA 138 05/03/2019   K 4.0 05/03/2019   CL 105 05/03/2019   CO2 20 (L) 05/03/2019   Lab Results  Component Value Date   WBC 6.4 05/03/2019   HGB 8.6 (L) 05/03/2019   HCT 25.9 (L) 05/03/2019   MCV 100.0 05/03/2019   PLT 280 05/03/2019   Lab Results  Component Value Date   CALCIUM 6.9 (L) 05/03/2019   PHOS 4.3 05/03/2019   Lab Results  Component Value Date   ALT 9 05/03/2019   AST 13 (L) 05/03/2019   ALKPHOS 107 05/03/2019   BILITOT 0.4 05/03/2019   Discharge Instructions: Discharge Instructions    Call MD for:  difficulty breathing, headache or visual disturbances   Complete by: As directed    Call MD for:  extreme fatigue   Complete by: As directed    Call MD for:  temperature >100.4   Complete by: As directed    Diet - low sodium heart healthy   Complete by: As directed    Discharge instructions   Complete by: As directed    It was a pleasure to take care of you  during your hospitalization!   You were admitted due to the shortness of breath and weakness you have had for the previous week. It was determined that your shortness of breath and weakness were likely due to being fluid overloaded and anemic from your end stage renal disease. Dialysis is the treatment for your end stage renal disease. You received dialysis in the hospital. You will resume dialysis in your outpatient dialysis center tomorrow (05/04/19). Continuing dialysis is very important for you as your kidneys are not able to adequately remove fluid or other wastes from your blood. Fluid can be retained throughout your body including your lungs when your kidneys are unable to remove enough fluid. Fluid in your lungs can make you very short of breath. If you continue to have shortness of breath at night that impacts your sleep, try to elevate yourself with pillows to see if that helps. If your shortness of breath continues, discuss this with your primary care provider  and your kidney specialist.  You were also found to be anemic on admission with a hemoglobin of 7; the amount of blood you have is not providing as much oxygen to your body as your body wants. We gave you 1 unit of blood due to your anemia. The anemia you have can make you feel weak. If you start to feel weaker, contact your primary care provider or kidney specialist.  Earlier today, you mentioned some itching that you have been having on your legs. We have not determined what skin condition you have. The itching is likely contributed to by your end stage renal disease. We are prescribing a steroid cream you can use up to twice daily to help with the itching. We also are prescribing Benadryl to help with you itching. You can take 25mg  up to every 6 hours to help with the itching. Benadryl may lead to increased sleepiness. A skin biopsy will be required to diagnose your skin condition. This will be completed by your primary care provider.   You will have a follow up appointment with your primary care provider at 10:15 am on 09/30.  Your previous home medications were continued as previously prescribed.  New Medications: Benadryl 25mg  (take a half tablet every 6 hours as needed for itching) Fluocinonide-emollient 0.05% cream (steroid cream-use up to 2x daily as needed for itching)  Home Care Instructions: 1. Continue going to dialysis every Tuesday, Thursday and Saturday. 2. Use steroid cream and Benadryl as prescribed to control leg itching.   Call your doctor or return to the emergency department if worse or: 1. You develop new confusion or increasing shortness of breath or weakness. 2. You develop fever >100.5 degrees Fahrenheit.   Increase activity slowly   Complete by: As directed       Signed: Flonnie Hailstone, MS IV  Attestation for Student Documentation:  I personally was present and performed or re-performed the history, physical exam and medical decision-making activities of this service and have verified that the service and findings are accurately documented in the student's note.  [name], MD 05/04/2019, 11:08 AM

## 2019-05-03 NOTE — Progress Notes (Signed)
[facility] NEPHROLOGY PROGRESS NOTE  Assessment/ Plan: Pt is a 43 y.o. yo female hypertension, HIV, tobacco abuse, ESRD on HD TTS at [facility], recently started on dialysis presented with shortness of breath and anemia.  Dialyzes at [facility], TTS EDW 72.5 kg. HD Bath 2K, 2.25 ca, 400/800, 180NR, 4 hours 15 minutes, RIJ TDC, Aranesp 200 mcg on 9/17, PTH 916, Heparin 3000 units.  1 Dyspnea likely due to anemia and probably some fluid overload.  Plan for blood transfusion today and hemodialysis.  In room air.  Denies shortness of breath this morning.   2 ESRD: TTS at [facility]: Receiving dialysis today off schedule.  UF goal 3 to 4 L as tolerated. She has IJ tunnel catheter for the access.  Hold off on heparin because of severe anemia.  3 Hypertension: BP elevated.  Resume home medication.  Ultrafiltration during dialysis.  4. Anemia of ESRD: A unit of blood transfusion today during HD.  Continue Aranesp, last dose was on 0000000.  5. Metabolic Bone Disease:  Phosphorus level acceptable.  Recently started on dialysis.  Okay to discharge today after blood transfusion and dialysis treatment.  Next treatment tomorrow, patient can go to her outpatient center.  Subjective: Seen and examined at dialysis unit.  Denies chest pain, shortness of breath, nausea or vomiting.  Hemoglobin low, plan for blood transfusion. Objective  Vital signs in last 24 hours: Vitals:   05/03/19 0818 05/03/19 0830 05/03/19 0900 05/03/19 0930  BP: (!) 172/92 (!) 159/86 (!) 159/72 (!) 160/70  Pulse: 80 88 87 88  Resp:      Temp:      TempSrc:      SpO2:      Weight:       Weight change:  No intake or output data in the 24 hours ending 05/03/19 0950     Labs: Basic Metabolic Panel: Recent Labs  Lab 05/02/19 1036 05/03/19 0500 05/03/19 0833  NA 138 138  --   K 4.0 4.0  --   CL 104 105  --   CO2 19* 20*  --   GLUCOSE 160* 108*  --   BUN 35* 38*  --   CREATININE 6.29* 6.70*  --   CALCIUM 7.2* 6.9*   --   PHOS  --   --  4.3   Liver Function Tests: Recent Labs  Lab 05/02/19 1036 05/03/19 0500  AST 15 13*  ALT 11 9  ALKPHOS 111 107  BILITOT 0.5 0.4  PROT 7.3 6.5  ALBUMIN 3.1* 2.8*   No results for input(s): LIPASE, AMYLASE in the last 168 hours. No results for input(s): AMMONIA in the last 168 hours. CBC: Recent Labs  Lab 05/02/19 1036 05/03/19 0500  WBC 5.9 6.4  NEUTROABS 3.9  --   HGB 7.0* 6.5*  HCT 22.2* 20.2*  MCV 100.5* 100.0  PLT 314 280   Cardiac Enzymes: No results for input(s): CKTOTAL, CKMB, CKMBINDEX, TROPONINI in the last 168 hours. CBG: Recent Labs  Lab 05/03/19 0721  GLUCAP 119*    Iron Studies: No results for input(s): IRON, TIBC, TRANSFERRIN, FERRITIN in the last 72 hours. Studies/Results: Dg Chest 2 View  Result Date: 05/02/2019 CLINICAL DATA:  Shortness of breath EXAM: CHEST - 2 VIEW COMPARISON:  04/14/2019 FINDINGS: Right IJ dialysis catheter tip mid right atrial level as before. Stable position. Stable cardiomegaly with trace pleural effusions and basilar streaky opacities compatible with atelectasis. Negative for significant CHF. Left midlung peripheral atelectasis also noted. No  pneumothorax. Trachea midline. Slight improvement in basilar aeration compared to 04/14/2019. IMPRESSION: Cardiomegaly with left midlung and bibasilar atelectasis and trace pleural effusions. Electronically Signed   By: Jerilynn Mages.  Shick M.D.   On: 05/02/2019 11:03    Medications: Infusions: . sodium chloride    . sodium chloride      Scheduled Medications: . sodium chloride   Intravenous Once  . amLODipine  10 mg Oral Daily  . bictegravir-emtricitabine-tenofovir AF  1 tablet Oral Daily  . calcitRIOL  0.25 mcg Oral Daily  . calcium acetate  1,334 mg Oral TID WC  . Chlorhexidine Gluconate Cloth  6 each Topical Q0600  . citalopram  20 mg Oral Daily  . gabapentin  300 mg Oral BID  . heparin      . hydrALAZINE  25 mg Oral TID  . [START ON 05/04/2019] influenza vac split  quadrivalent PF  0.5 mL Intramuscular Tomorrow-1000  . insulin aspart  0-9 Units Subcutaneous TID WC  . insulin glargine  5 Units Subcutaneous Q2200    have reviewed scheduled and prn medications.  Physical Exam: General:NAD, comfortable Heart:RRR, s1s2 nl Lungs:clear b/l, no crackle Abdomen:soft, Non-tender, non-distended Extremities:No edema Dialysis Access: Right IJ TDC  Kathy Frank 05/03/2019,9:50 AM  LOS: 0 days  Pager: ID:[phone]

## 2019-05-04 ENCOUNTER — Telehealth: Payer: Self-pay | Admitting: Orthopedic Surgery

## 2019-05-04 LAB — HEPATITIS PANEL, ACUTE
HCV Ab: <0.1 s/co ratio (ref 0.0–0.9)
Hep A IgM: NEGATIVE
Hep B C IgM: NEGATIVE
Hepatitis B Surface Ag: NEGATIVE

## 2019-05-04 LAB — TYPE AND SCREEN
ABO/RH(D): B POS
Antibody Screen: NEGATIVE
Unit division: 0

## 2019-05-04 LAB — HEPATITIS B E ANTIGEN: Hep B E Ag: NEGATIVE

## 2019-05-04 LAB — BPAM RBC
Blood Product Expiration Date: 202010082359
ISSUE DATE / TIME: 202009231144
Unit Type and Rh: 1700

## 2019-05-04 LAB — HEPATITIS B SURFACE ANTIBODY, QUANTITATIVE: Hep B S AB Quant (Post): <3.1 m[IU]/mL — ABNORMAL LOW (ref >9.9)

## 2019-05-04 LAB — HEPATITIS B CORE ANTIBODY, TOTAL: Hep B Core Total Ab: NEGATIVE

## 2019-05-04 MED FILL — GABAPENTIN 300 MG CAPSULE: 300 | 90 days supply | Qty: 90 | Fill #0

## 2019-05-04 NOTE — Telephone Encounter (Signed)
02/20/19 ov note faxed to [place]

## 2019-05-09 ENCOUNTER — Encounter: Payer: Medicaid Other | Admitting: Vascular Surgery

## 2019-05-09 ENCOUNTER — Encounter: Payer: Medicaid Other | Admitting: Internal Medicine

## 2019-05-12 ENCOUNTER — Ambulatory Visit (INDEPENDENT_AMBULATORY_CARE_PROVIDER_SITE_OTHER): Payer: Medicaid Other | Admitting: Radiation Oncology

## 2019-05-12 ENCOUNTER — Other Ambulatory Visit: Payer: Self-pay

## 2019-05-12 ENCOUNTER — Encounter: Payer: Self-pay | Admitting: Radiation Oncology

## 2019-05-12 VITALS — BP 187/109 | HR 88 | Temp 99.3°F | Wt 154.1 lb

## 2019-05-12 DIAGNOSIS — N186 End stage renal disease: Secondary | ICD-10-CM | POA: Diagnosis not present

## 2019-05-12 DIAGNOSIS — Z6825 Body mass index (BMI) 25.0-25.9, adult: Secondary | ICD-10-CM | POA: Diagnosis not present

## 2019-05-12 DIAGNOSIS — D649 Anemia, unspecified: Secondary | ICD-10-CM

## 2019-05-12 DIAGNOSIS — Z89612 Acquired absence of left leg above knee: Secondary | ICD-10-CM | POA: Diagnosis not present

## 2019-05-12 DIAGNOSIS — Z992 Dependence on renal dialysis: Secondary | ICD-10-CM | POA: Diagnosis not present

## 2019-05-12 DIAGNOSIS — B20 Human immunodeficiency virus [HIV] disease: Secondary | ICD-10-CM

## 2019-05-12 DIAGNOSIS — E1169 Type 2 diabetes mellitus with other specified complication: Secondary | ICD-10-CM | POA: Diagnosis not present

## 2019-05-12 DIAGNOSIS — T8131XA Disruption of external operation (surgical) wound, not elsewhere classified, initial encounter: Secondary | ICD-10-CM | POA: Diagnosis not present

## 2019-05-12 DIAGNOSIS — Z79899 Other long term (current) drug therapy: Secondary | ICD-10-CM

## 2019-05-12 DIAGNOSIS — R21 Rash and other nonspecific skin eruption: Secondary | ICD-10-CM

## 2019-05-12 DIAGNOSIS — I120 Hypertensive chronic kidney disease with stage 5 chronic kidney disease or end stage renal disease: Secondary | ICD-10-CM

## 2019-05-12 DIAGNOSIS — E1122 Type 2 diabetes mellitus with diabetic chronic kidney disease: Secondary | ICD-10-CM | POA: Diagnosis present

## 2019-05-12 DIAGNOSIS — I10 Essential (primary) hypertension: Secondary | ICD-10-CM

## 2019-05-12 DIAGNOSIS — E669 Obesity, unspecified: Secondary | ICD-10-CM | POA: Diagnosis not present

## 2019-05-12 DIAGNOSIS — Z794 Long term (current) use of insulin: Secondary | ICD-10-CM

## 2019-05-12 MED ORDER — VICTOZA 18 MG/3ML ~~LOC~~ SOPN: 1.2000 mg | PEN_INJECTOR | Freq: Every day | SUBCUTANEOUS | 1 refills | Status: DC

## 2019-05-12 MED ORDER — AMLODIPINE BESYLATE 10 MG PO TABS: 10.0000 mg | ORAL_TABLET | Freq: Every day | ORAL | 0 refills | Status: DC

## 2019-05-12 MED ORDER — HYDRALAZINE HCL 25 MG PO TABS: 25.0000 mg | ORAL_TABLET | Freq: Three times a day (TID) | ORAL | 2 refills | Status: DC

## 2019-05-12 MED ORDER — INSULIN GLARGINE 100 UNIT/ML SOLOSTAR PEN: 5.0000 [IU] | PEN_INJECTOR | Freq: Every day | SUBCUTANEOUS | 0 refills | Status: DC

## 2019-05-12 NOTE — Assessment & Plan Note (Addendum)
-  Pt presents with dehiscence of incision at LUE fistula site; reports bleeding and drainage which was significant a few weeks ago but has decreased since -no mention of this dehiscence in vascular surgery notes -wound without pain, fluctuance or surrounding erythema; there is surrounding induration but it's in the site of her known fistula; pt afebrile  -nursing called Vascular surgery who states she should let dialysis know and if they are concerned they will contact them otherwise to follow up on October 27th -follow up in 2 weeks

## 2019-05-12 NOTE — Patient Instructions (Addendum)
Thank you for coming to your appointment. It was so nice to see you. Today we discussed  Kathy Frank was seen today for hospitalization follow-up.  Diagnoses and all orders for this visit:  Acute on chronic anemia -     You do not have any fatigue or shortness of breath and you report a higher hemoglobin at dialysis yesterday so we will not obtain blood today -     Follow up in 2 weeks  Essential hypertension -     Your blood pressure was elevated today but as you hadn't taken your home medications we will not make any changes at this time with plan to recheck in 2 weeks -     We reordered the below medications for you  -     amLODipine (NORVASC) 10 MG tablet; Take 1 tablet (10 mg total) by mouth daily. -     hydrALAZINE (APRESOLINE) 25 MG tablet; Take 1 tablet (25 mg total) by mouth 3 (three) times daily.  Diabetes mellitus type 2 in obese ([place]) -     We reordered the below medications for you -     Insulin Glargine (LANTUS) 100 UNIT/ML Solostar Pen; Inject 5 Units into the skin at bedtime. -     liraglutide (VICTOZA) 18 MG/3ML SOPN; Inject 0.2 mLs (1.2 mg total) into the skin daily.  Dehiscence of operative wound, initial encounter       -     New area of dehiscence at incision site over known fistula which is new and was not present at last visit with vascular surgery      -      Spoke with vascular surgery who says you should let dialysis know at your next visit and if they are concerned they will call vascular surgery at that time      -      Otherwise, please follow up with vascular surgery on October 27th  Rash and other nonspecific skin eruption      -     Start using the steroid cream that should be ordered to the [facility]     -      Follow up in 2 weeks     If labs were drawn today, I will call you with any abnormal results. Otherwise, please keep up the good work. I look forward to seeing you again soon at your follow up in 3  months.  Sincerely,  Al Decant, MD

## 2019-05-12 NOTE — Progress Notes (Signed)
   CC: hospital follow up  HPI:  [name] is a 43 y.o. with a PMHx of DM, HTN, ESRD on HD (since April 25, 2019), anemia and HIV who is here for a hospital follow up.   Past Medical History:  Diagnosis Date  . Below-knee amputation of left lower extremity ([place])   . CKD (chronic kidney disease) stage 5, GFR less than 15 ml/min (HCC) 06/19/2016   06/30/16: Seen by Dr. Posey Pronto [Nephrology]. Suspect etiologies to include diabetes and hypertension though cannot exclude HIV-associated nephropathy. Advised her against from using excessive Goody powder.  . Contact dermatitis 04/21/2016   07/29/16: Skin biopsy performed by [facility] notable for acute spongiotic dermatitis consistent with contact dermatitis.  . Cyclical vomiting    THC induced???  . Depression 06/28/2006   Qualifier: Diagnosis of  By: Riccardo Dubin MD, [place]    . Diabetes mellitus type 2 in obese ([place]) 06/28/1994  . DKA (diabetic ketoacidoses) ([place]) 12/2014  . Dyspnea   . Erosive esophagitis   . Esophageal reflux   . Essential hypertension 11/16/2013  . Gastroparesis    ? diabetic  . GERD (gastroesophageal reflux disease)   . History of blood transfusion   . Human immunodeficiency virus (HIV) disease ([place]) 04/23/2016  . Hypertension   . Moderate nonproliferative diabetic retinopathy of both eyes ([place]) 11/21/2014   11/14/14: Noted on retinal imaging; needs follow-up imaging in 6 months  05/22/16: Noted on retinal imaging again; needs follow-up imaging in 6 months  . Normocytic anemia   . TOBACCO USER 06/28/2006   Qualifier: Diagnosis of  By: Riccardo Dubin MD, Sherren Mocha    . Type II diabetes mellitus ([place]) 1994   diagnosed around 60  . Wound dehiscence   . Wound infection s/p L transmetatarsal amputation    Review of Systems:    Review of Systems  Constitutional: Negative for malaise/fatigue.  Respiratory: Negative for shortness of breath.   Skin: Positive for rash.  Neurological: Negative for dizziness and  headaches.  All other systems reviewed and are negative.  Physical Exam:  There were no vitals filed for this visit. Physical Exam  Constitutional: She is oriented to person, place, and time. No distress.  Cardiovascular: Normal rate, regular rhythm and normal heart sounds.  No murmur heard. Pulmonary/Chest: Effort normal and breath sounds normal. No respiratory distress.  Musculoskeletal:     Comments: Left AKA  Neurological: She is alert and oriented to person, place, and time.  Skin: Rash (generalized molluscum appering rash; purple polygonal plaque on lateral right ankle and foot ) noted. She is not diaphoretic.  Nursing note and vitals reviewed.   Assessment & Plan:   See Encounters Tab for problem based charting.  Patient seen with Dr. Rebeca Alert

## 2019-05-13 NOTE — Assessment & Plan Note (Signed)
Today's Vitals   05/12/19 1055  BP: (!) 187/109  Pulse: 88  Temp: 99.3 F (37.4 C)  TempSrc: Oral  SpO2: 100%  Weight: 154 lb 1.6 oz (69.9 kg)   Body mass index is 25.64 kg/m.   -pt w/ known essential hypertension on amlodipine 10 daily, hydralazine 25 mg TID after recent discharge from hospital for symptomatic anemia and hypertensive urgency with elevated BP today in setting of running out of one of her BP medications and not taking her other medication yet  -no fatigue, SOB, dizziness or headaches today -pt reports an even more elevated BP at dialysis the day before -refilled amlodipine and hydralazine -plan to recheck BP at close follow up visit in 2 weeks

## 2019-05-13 NOTE — Assessment & Plan Note (Addendum)
-  pt here for hospital follow up after admission on 09/22 for symptomatic anemia with a Hgb of 6.5  -Hgb of 8.6 at discharge -pt without fatigue or SOB today -reports that her Hgb was "higher" at dialysis yesterday so we did not obtain a follow up CBC  -follow up in 2 weeks

## 2019-05-13 NOTE — Assessment & Plan Note (Signed)
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

## 2019-05-13 NOTE — Assessment & Plan Note (Signed)
-  no sx of hypoglycemia; no fatigue, dizziness or headaches -refilled lantus and victoza today

## 2019-05-15 ENCOUNTER — Telehealth: Payer: Self-pay

## 2019-05-15 ENCOUNTER — Telehealth: Payer: Self-pay | Admitting: *Deleted

## 2019-05-15 MED ORDER — TRIAMCINOLONE ACETONIDE 0.025 % EX OINT: 1.0000 "application " | TOPICAL_OINTMENT | Freq: Two times a day (BID) | CUTANEOUS | 0 refills | Status: DC

## 2019-05-15 MED FILL — TRIAMCINOLONE 0.025% OINT: 0.025 | 15 days supply | Qty: 30 | Fill #0

## 2019-05-15 NOTE — Telephone Encounter (Signed)
PA for Lidex E.  Lidex E not covered by patient's insurance.  Patient will need to try and fail Betamethasome Valerate Creme/Ointment or Triamcinolone Acetonide cream /lotion or ointment.  Message to be sent to Dr. Isac Sarna to consider a change.  Sander Nephew, RN 05/15/2019 11:41 AM.

## 2019-05-15 NOTE — Telephone Encounter (Signed)
Will defer to Dr. Frederico Hamman as to what she wants to switch her to

## 2019-05-15 NOTE — Addendum Note (Signed)
Addended by: Adela Ports on: 05/15/2019 03:17 PM   Modules accepted: Orders

## 2019-05-15 NOTE — Telephone Encounter (Signed)
Thank you :)

## 2019-05-15 NOTE — Telephone Encounter (Signed)
Pt states she needs a PA on the lidex-e

## 2019-05-15 NOTE — Telephone Encounter (Signed)
Will also send to Dr.Lanier since she saw the patient on Friday and discussed the rash with the patient for possible initiation of triamcinolone or betamethasone

## 2019-05-15 NOTE — Telephone Encounter (Signed)
Requesting to speak with a nurse about meds. Please call pt back.  

## 2019-05-15 NOTE — Telephone Encounter (Signed)
I ordered her triamcinolone.

## 2019-05-16 MED FILL — BIKTARVY 50-200-25 MG TABS: 50-200-25 | 30 days supply | Qty: 30 | Fill #3

## 2019-05-17 ENCOUNTER — Encounter: Payer: Self-pay | Admitting: Physician Assistant

## 2019-05-17 ENCOUNTER — Telehealth: Payer: Self-pay | Admitting: *Deleted

## 2019-05-17 ENCOUNTER — Other Ambulatory Visit: Payer: Self-pay

## 2019-05-17 ENCOUNTER — Ambulatory Visit (INDEPENDENT_AMBULATORY_CARE_PROVIDER_SITE_OTHER): Payer: Self-pay | Admitting: Physician Assistant

## 2019-05-17 VITALS — BP 190/98 | HR 80 | Temp 97.9°F | Resp 20 | Ht 65.0 in | Wt 154.0 lb

## 2019-05-17 DIAGNOSIS — N186 End stage renal disease: Secondary | ICD-10-CM

## 2019-05-17 DIAGNOSIS — Z992 Dependence on renal dialysis: Secondary | ICD-10-CM

## 2019-05-17 DIAGNOSIS — N185 Chronic kidney disease, stage 5: Secondary | ICD-10-CM

## 2019-05-17 MED ORDER — CEPHALEXIN 500 MG PO CAPS: 500.0000 mg | ORAL_CAPSULE | Freq: Three times a day (TID) | ORAL | 0 refills | Status: DC

## 2019-05-17 MED FILL — CEPHALEXIN 500 MG CAPSULE: 500 | 7 days supply | Qty: 21 | Fill #0

## 2019-05-17 NOTE — Progress Notes (Signed)
POST OPERATIVE OFFICE NOTE    CC:  F/u for surgery  HPI:  This is a 43 y.o. female who is s/p Second stage basilic AV fistula transposition on 04/14/2019 by DR. Early.  She reports drainage that is yellow discolored from the vein harvest site on her left arm.  No erythema or edema.  She denise fever and chills.  She has some incisional pain that is getting better with time. She is currently on HD using a right TDC.     No Known Allergies  Current Outpatient Medications  Medication Sig Dispense Refill  . albuterol (VENTOLIN HFA) 108 (90 Base) MCG/ACT inhaler Inhale 2 puffs into the lungs every 4 (four) hours as needed for wheezing or shortness of breath.    Marland Kitchen amLODipine (NORVASC) 10 MG tablet Take 1 tablet (10 mg total) by mouth daily. 90 tablet 0  . bictegravir-emtricitabine-tenofovir AF (BIKTARVY) 50-200-25 MG TABS tablet Take 1 tablet by mouth daily. 30 tablet 11  . calcitRIOL (ROCALTROL) 0.25 MCG capsule Take 1 capsule (0.25 mcg total) by mouth daily. 30 capsule 1  . calcium acetate (PHOSLO) 667 MG capsule Take 2 capsules (1,334 mg total) by mouth 3 (three) times daily with meals. 180 capsule 2  . citalopram (CELEXA) 20 MG tablet Take two 20 mg tablets (40 mg total) each day for depression. (Patient taking differently: Take 20 mg by mouth daily. ) 60 tablet 0  . diphenhydrAMINE (BENADRYL) 50 MG tablet Take 0.5 tablets (25 mg total) by mouth every 6 (six) hours as needed for itching (As needed for itching). 30 tablet 0  . gabapentin (NEURONTIN) 300 MG capsule Take 1 capsule (300 mg total) by mouth daily as needed. 90 capsule 0  . glucose blood (ACCU-CHEK GUIDE) test strip USE TO TEST BLOOD SUGAR 3 TIMES DAILY . Diagnosis code  E11.69, E66.9 100 each 11  . hydrALAZINE (APRESOLINE) 25 MG tablet Take 1 tablet (25 mg total) by mouth 3 (three) times daily. 90 tablet 2  . Insulin Glargine (LANTUS) 100 UNIT/ML Solostar Pen Inject 5 Units into the skin at bedtime. 3 mL 0  . liraglutide (VICTOZA) 18  MG/3ML SOPN Inject 0.2 mLs (1.2 mg total) into the skin daily. 3 mL 1  . oxyCODONE-acetaminophen (PERCOCET/ROXICET) 5-325 MG tablet Take 1 tablet by mouth every 6 (six) hours as needed (pain). 20 tablet 0  . triamcinolone (KENALOG) 0.025 % ointment Apply 1 application topically 2 (two) times daily. 30 g 0  . cephALEXin (KEFLEX) 500 MG capsule Take 1 capsule (500 mg total) by mouth 3 (three) times daily. Patient is on Dialysis and will discuss how to take the medication for skin abscess between HD sessions. 21 capsule 0   No current facility-administered medications for this visit.      ROS:  See HPI  Physical Exam:  Left UE proximal vein harvest site with superficial separation and yellow/clear drainage.  No stitch is visible,  Slight tenderness to palpation.  Tissue is soft without erythema.  The other incisions are fully healed and the fistula has an excellent thrill.  Sensation of the left UE is intact and grip is 5/5 and equal B.     Assessment/Plan:  This is a 42 y.o. female who is s/p:Basilic transposition second stage fistula with like stitch abscess.     Recommended dry dressing and Kefle 500 mg TID.  She will consult her Nephrologist on scheduled times to take the antibiotics in regards to HD.  She will f/u later this month  for a wound check.    The fistula may be accessed once the incision has healed.  It was originally created on 10/14/2018.       Roxy Horseman PA-C [facility] [phone]  Clinic MD:  Oneida Alar

## 2019-05-17 NOTE — Telephone Encounter (Signed)
Call from Dr. Donnetta Hutching to schedule patient with NP/PA to have Hd access checked due to patient's c/o drainage and bleeding at access. Patient called and appt.  given.

## 2019-05-20 DIAGNOSIS — L299 Pruritus, unspecified: Secondary | ICD-10-CM | POA: Insufficient documentation

## 2019-05-20 HISTORY — DX: Pruritus, unspecified: L29.9

## 2019-05-22 ENCOUNTER — Telehealth: Payer: Self-pay | Admitting: *Deleted

## 2019-05-22 ENCOUNTER — Ambulatory Visit: Payer: Medicaid Other | Admitting: Orthopedic Surgery

## 2019-05-22 NOTE — Progress Notes (Signed)
Internal Medicine Clinic Attending  I saw and evaluated the patient.  I personally confirmed the key portions of the history and exam documented by Dr. Lanier and I reviewed pertinent patient test results.  The assessment, diagnosis, and plan were formulated together and I agree with the documentation in the resident's note.  Goerge Mohr, M.D., Ph.D.  

## 2019-05-22 NOTE — Telephone Encounter (Addendum)
Information was sent to [employer] for PA for Victoza.  Conf # [id number] W.  Awaiting determination.  Sander Nephew, RN 05/22/2019 10:50 AM.   Tracks Website review approval for Victoza 05/22/2019 through 05/21/2020.  Sander Nephew, RN 05/23/2019 1:52 PM.

## 2019-05-25 DIAGNOSIS — E162 Hypoglycemia, unspecified: Secondary | ICD-10-CM | POA: Insufficient documentation

## 2019-05-26 ENCOUNTER — Encounter: Payer: Self-pay | Admitting: Internal Medicine

## 2019-05-26 ENCOUNTER — Ambulatory Visit: Payer: Medicaid Other

## 2019-05-29 ENCOUNTER — Ambulatory Visit: Payer: Medicaid Other | Admitting: Internal Medicine

## 2019-05-30 ENCOUNTER — Telehealth: Payer: Self-pay | Admitting: *Deleted

## 2019-05-30 DIAGNOSIS — Z89512 Acquired absence of left leg below knee: Secondary | ICD-10-CM

## 2019-05-30 DIAGNOSIS — D509 Iron deficiency anemia, unspecified: Secondary | ICD-10-CM | POA: Insufficient documentation

## 2019-05-30 NOTE — Telephone Encounter (Signed)
Pt would like a referral for [facility] with [facility] to learn how to walk with prosthetic limb, she stated she called [facility] and they said they could help, she refused to give ph# or person she spoke with.

## 2019-05-30 NOTE — Telephone Encounter (Signed)
Done

## 2019-05-30 NOTE — Telephone Encounter (Signed)
Returned phone call to Kathy Frank requesting Physical Therapy to help her walk with her Prosthesis, I will send a message to her PCP for further instructions Kathy Frank C10/20/20202:52 PM

## 2019-05-31 NOTE — Telephone Encounter (Signed)
[facility] is requiring virtual or in-person F2F for [facility] orders. They will not accept telehealth. PCP has no upcoming appts. Will need to be scheduled in [facility]. Hubbard Hartshorn, BSN, RN-BC

## 2019-06-01 ENCOUNTER — Telehealth: Payer: Self-pay

## 2019-06-01 NOTE — Telephone Encounter (Signed)
I returned phone call to patient, about coming into the [facility] clinic for assement for PT with home health. The patient stated the [place] doctor  Has set her up to go to PT. The patient will start on Monday Viki Carrera, [place] C10/22/20204:23 PM

## 2019-06-01 NOTE — Telephone Encounter (Signed)
Please call pt back.

## 2019-06-06 ENCOUNTER — Encounter: Payer: Medicaid Other | Admitting: Vascular Surgery

## 2019-06-06 ENCOUNTER — Inpatient Hospital Stay (HOSPITAL_COMMUNITY)
Admission: EM | Admit: 2019-06-06 | Discharge: 2019-06-07 | DRG: 124 | Disposition: A | Discharge status: Home / Self Care | Payer: Medicaid Other | Attending: Internal Medicine | Admitting: Internal Medicine

## 2019-06-06 ENCOUNTER — Emergency Department (HOSPITAL_COMMUNITY): Payer: Medicaid Other

## 2019-06-06 ENCOUNTER — Other Ambulatory Visit: Payer: Self-pay

## 2019-06-06 ENCOUNTER — Encounter (HOSPITAL_COMMUNITY): Payer: Self-pay | Admitting: Emergency Medicine

## 2019-06-06 DIAGNOSIS — X58XXXA Exposure to other specified factors, initial encounter: Secondary | ICD-10-CM

## 2019-06-06 DIAGNOSIS — E11319 Type 2 diabetes mellitus with unspecified diabetic retinopathy without macular edema: Secondary | ICD-10-CM

## 2019-06-06 DIAGNOSIS — E11649 Type 2 diabetes mellitus with hypoglycemia without coma: Secondary | ICD-10-CM

## 2019-06-06 DIAGNOSIS — T8130XA Disruption of wound, unspecified, initial encounter: Secondary | ICD-10-CM | POA: Diagnosis present

## 2019-06-06 DIAGNOSIS — E113393 Type 2 diabetes mellitus with moderate nonproliferative diabetic retinopathy without macular edema, bilateral: Secondary | ICD-10-CM | POA: Diagnosis not present

## 2019-06-06 DIAGNOSIS — G9340 Encephalopathy, unspecified: Secondary | ICD-10-CM

## 2019-06-06 DIAGNOSIS — E1122 Type 2 diabetes mellitus with diabetic chronic kidney disease: Secondary | ICD-10-CM | POA: Diagnosis present

## 2019-06-06 DIAGNOSIS — Z833 Family history of diabetes mellitus: Secondary | ICD-10-CM | POA: Diagnosis not present

## 2019-06-06 DIAGNOSIS — H5789 Other specified disorders of eye and adnexa: Secondary | ICD-10-CM

## 2019-06-06 DIAGNOSIS — I120 Hypertensive chronic kidney disease with stage 5 chronic kidney disease or end stage renal disease: Secondary | ICD-10-CM

## 2019-06-06 DIAGNOSIS — B20 Human immunodeficiency virus [HIV] disease: Secondary | ICD-10-CM | POA: Diagnosis present

## 2019-06-06 DIAGNOSIS — Z79899 Other long term (current) drug therapy: Secondary | ICD-10-CM

## 2019-06-06 DIAGNOSIS — N2581 Secondary hyperparathyroidism of renal origin: Secondary | ICD-10-CM | POA: Diagnosis present

## 2019-06-06 DIAGNOSIS — Z87891 Personal history of nicotine dependence: Secondary | ICD-10-CM

## 2019-06-06 DIAGNOSIS — F329 Major depressive disorder, single episode, unspecified: Secondary | ICD-10-CM | POA: Diagnosis present

## 2019-06-06 DIAGNOSIS — M79652 Pain in left thigh: Secondary | ICD-10-CM | POA: Diagnosis present

## 2019-06-06 DIAGNOSIS — Z20828 Contact with and (suspected) exposure to other viral communicable diseases: Secondary | ICD-10-CM | POA: Diagnosis present

## 2019-06-06 DIAGNOSIS — Z89422 Acquired absence of other left toe(s): Secondary | ICD-10-CM

## 2019-06-06 DIAGNOSIS — Z810 Family history of intellectual disabilities: Secondary | ICD-10-CM

## 2019-06-06 DIAGNOSIS — K3184 Gastroparesis: Secondary | ICD-10-CM | POA: Diagnosis present

## 2019-06-06 DIAGNOSIS — H547 Unspecified visual loss: Secondary | ICD-10-CM | POA: Diagnosis present

## 2019-06-06 DIAGNOSIS — F1490 Cocaine use, unspecified, uncomplicated: Secondary | ICD-10-CM | POA: Diagnosis present

## 2019-06-06 DIAGNOSIS — S0502XA Injury of conjunctiva and corneal abrasion without foreign body, left eye, initial encounter: Secondary | ICD-10-CM | POA: Diagnosis present

## 2019-06-06 DIAGNOSIS — E669 Obesity, unspecified: Secondary | ICD-10-CM | POA: Diagnosis present

## 2019-06-06 DIAGNOSIS — R4182 Altered mental status, unspecified: Secondary | ICD-10-CM | POA: Diagnosis present

## 2019-06-06 DIAGNOSIS — Z8719 Personal history of other diseases of the digestive system: Secondary | ICD-10-CM | POA: Diagnosis not present

## 2019-06-06 DIAGNOSIS — E1151 Type 2 diabetes mellitus with diabetic peripheral angiopathy without gangrene: Secondary | ICD-10-CM | POA: Diagnosis present

## 2019-06-06 DIAGNOSIS — E1143 Type 2 diabetes mellitus with diabetic autonomic (poly)neuropathy: Secondary | ICD-10-CM | POA: Diagnosis present

## 2019-06-06 DIAGNOSIS — K219 Gastro-esophageal reflux disease without esophagitis: Secondary | ICD-10-CM | POA: Diagnosis present

## 2019-06-06 DIAGNOSIS — L259 Unspecified contact dermatitis, unspecified cause: Secondary | ICD-10-CM | POA: Diagnosis present

## 2019-06-06 DIAGNOSIS — N186 End stage renal disease: Secondary | ICD-10-CM | POA: Diagnosis present

## 2019-06-06 DIAGNOSIS — Z9115 Patient's noncompliance with renal dialysis: Secondary | ICD-10-CM

## 2019-06-06 DIAGNOSIS — R011 Cardiac murmur, unspecified: Secondary | ICD-10-CM

## 2019-06-06 DIAGNOSIS — Z89512 Acquired absence of left leg below knee: Secondary | ICD-10-CM | POA: Diagnosis not present

## 2019-06-06 DIAGNOSIS — Z992 Dependence on renal dialysis: Secondary | ICD-10-CM | POA: Diagnosis not present

## 2019-06-06 DIAGNOSIS — Z794 Long term (current) use of insulin: Secondary | ICD-10-CM

## 2019-06-06 DIAGNOSIS — D631 Anemia in chronic kidney disease: Secondary | ICD-10-CM | POA: Diagnosis present

## 2019-06-06 HISTORY — DX: Altered mental status, unspecified: R41.82

## 2019-06-06 HISTORY — DX: Other specified disorders of eye and adnexa: H57.89

## 2019-06-06 LAB — COMPREHENSIVE METABOLIC PANEL
ALT: 20 U/L (ref 0–44)
AST: 30 U/L (ref 15–41)
Albumin: 3.6 g/dL (ref 3.5–5.0)
Alkaline Phosphatase: 140 U/L — ABNORMAL HIGH (ref 38–126)
Anion gap: 20 — ABNORMAL HIGH (ref 5–15)
BUN: 36 mg/dL — ABNORMAL HIGH (ref 6–20)
CO2: 21 mmol/L — ABNORMAL LOW (ref 22–32)
Calcium: 8.4 mg/dL — ABNORMAL LOW (ref 8.9–10.3)
Chloride: 95 mmol/L — ABNORMAL LOW (ref 98–111)
Creatinine, Ser: 5.73 mg/dL — ABNORMAL HIGH (ref 0.44–1.00)
GFR calc Af Amer: 10 mL/min — ABNORMAL LOW (ref >60)
GFR calc non Af Amer: 8 mL/min — ABNORMAL LOW (ref >60)
Glucose, Bld: 69 mg/dL — ABNORMAL LOW (ref 70–99)
Potassium: 3.9 mmol/L (ref 3.5–5.1)
Sodium: 136 mmol/L (ref 135–145)
Total Bilirubin: 0.8 mg/dL (ref 0.3–1.2)
Total Protein: 9.2 g/dL — ABNORMAL HIGH (ref 6.5–8.1)

## 2019-06-06 LAB — CBC WITH DIFFERENTIAL/PLATELET
Abs Immature Granulocytes: 0.02 10*3/uL (ref 0.00–0.07)
Basophils Absolute: 0.1 10*3/uL (ref 0.0–0.1)
Basophils Relative: 1 %
Eosinophils Absolute: 0.2 10*3/uL (ref 0.0–0.5)
Eosinophils Relative: 3 %
HCT: 37.3 % (ref 36.0–46.0)
Hemoglobin: 11.8 g/dL — ABNORMAL LOW (ref 12.0–15.0)
Immature Granulocytes: 0 %
Lymphocytes Relative: 16 %
Lymphs Abs: 1.2 10*3/uL (ref 0.7–4.0)
MCH: 29.3 pg (ref 26.0–34.0)
MCHC: 31.6 g/dL (ref 30.0–36.0)
MCV: 92.6 fL (ref 80.0–100.0)
Monocytes Absolute: 0.7 10*3/uL (ref 0.1–1.0)
Monocytes Relative: 9 %
Neutro Abs: 5.2 10*3/uL (ref 1.7–7.7)
Neutrophils Relative %: 71 %
Platelets: 292 10*3/uL (ref 150–400)
RBC: 4.03 MIL/uL (ref 3.87–5.11)
RDW: 17.1 % — ABNORMAL HIGH (ref 11.5–15.5)
WBC: 7.3 10*3/uL (ref 4.0–10.5)
nRBC: 1.6 % — ABNORMAL HIGH (ref 0.0–0.2)

## 2019-06-06 LAB — SALICYLATE LEVEL: Salicylate Lvl: <7 mg/dL (ref 2.8–30.0)

## 2019-06-06 LAB — ETHANOL: Alcohol, Ethyl (B): <10 mg/dL (ref <10)

## 2019-06-06 LAB — CBG MONITORING, ED
Glucose-Capillary: 65 mg/dL — ABNORMAL LOW (ref 70–99)
Glucose-Capillary: 152 mg/dL — ABNORMAL HIGH (ref 70–99)

## 2019-06-06 LAB — SARS CORONAVIRUS 2 (TAT 6-24 HRS): SARS Coronavirus 2: NEGATIVE

## 2019-06-06 MED ORDER — TETRACAINE HCL 0.5 % OP SOLN
2.0000 [drp] | Freq: Once | OPHTHALMIC | Status: AC
  Administered 2019-06-06: 2 [drp] via OPHTHALMIC
  Filled 2019-06-06: qty 4

## 2019-06-06 MED ORDER — LORAZEPAM 2 MG/ML IJ SOLN
1.0000 mg | Freq: Once | INTRAMUSCULAR | Status: AC
  Administered 2019-06-06: 1 mg via INTRAVENOUS
  Filled 2019-06-06: qty 1

## 2019-06-06 MED ORDER — RENA-VITE PO TABS
1.0000 | ORAL_TABLET | Freq: Every day | ORAL | Status: DC
  Administered 2019-06-07: 1 via ORAL
  Filled 2019-06-06: qty 1

## 2019-06-06 MED ORDER — CALCITRIOL 0.25 MCG PO CAPS
0.2500 ug | ORAL_CAPSULE | Freq: Every day | ORAL | Status: DC
  Filled 2019-06-06: qty 1

## 2019-06-06 MED ORDER — CALCIUM ACETATE (PHOS BINDER) 667 MG PO CAPS
1334.0000 mg | ORAL_CAPSULE | Freq: Three times a day (TID) | ORAL | Status: DC
  Filled 2019-06-06 (×3): qty 2

## 2019-06-06 MED ORDER — ACETAMINOPHEN 325 MG PO TABS: 650.0000 mg | ORAL_TABLET | Freq: Four times a day (QID) | ORAL | Status: DC | PRN

## 2019-06-06 MED ORDER — BICTEGRAVIR-EMTRICITAB-TENOFOV 50-200-25 MG PO TABS
1.0000 | ORAL_TABLET | Freq: Every day | ORAL | Status: DC
  Filled 2019-06-06: qty 1

## 2019-06-06 MED ORDER — HEPARIN SODIUM (PORCINE) 5000 UNIT/ML IJ SOLN: 5000.0000 [IU] | Freq: Three times a day (TID) | INTRAMUSCULAR | Status: DC

## 2019-06-06 MED ORDER — FLUORESCEIN SODIUM 1 MG OP STRP
1.0000 | ORAL_STRIP | Freq: Once | OPHTHALMIC | Status: AC
  Administered 2019-06-06: 1 via OPHTHALMIC
  Filled 2019-06-06: qty 1

## 2019-06-06 MED ORDER — NAPHAZOLINE-GLYCERIN 0.012-0.2 % OP SOLN: 1.0000 [drp] | Freq: Four times a day (QID) | OPHTHALMIC | Status: DC | PRN

## 2019-06-06 MED ORDER — INSULIN ASPART 100 UNIT/ML ~~LOC~~ SOLN: 0.0000 [IU] | Freq: Three times a day (TID) | SUBCUTANEOUS | Status: DC

## 2019-06-06 MED ORDER — DEXTROSE 50 % IV SOLN
25.0000 mL | Freq: Once | INTRAVENOUS | Status: AC
  Administered 2019-06-06: 25 mL via INTRAVENOUS
  Filled 2019-06-06: qty 50

## 2019-06-06 MED ORDER — SODIUM CHLORIDE 0.9% FLUSH: 3.0000 mL | Freq: Two times a day (BID) | INTRAVENOUS | Status: DC

## 2019-06-06 MED ORDER — ACETAMINOPHEN 650 MG RE SUPP: 650.0000 mg | Freq: Four times a day (QID) | RECTAL | Status: DC | PRN

## 2019-06-06 MED ORDER — TRIAMCINOLONE ACETONIDE 0.1 % EX OINT
1.0000 "application " | TOPICAL_OINTMENT | Freq: Two times a day (BID) | CUTANEOUS | Status: DC
  Filled 2019-06-06: qty 15

## 2019-06-06 MED ORDER — LORAZEPAM 2 MG/ML IJ SOLN: 1.0000 mg | Freq: Once | INTRAMUSCULAR | Status: DC

## 2019-06-06 NOTE — ED Provider Notes (Addendum)
[facility] EMERGENCY DEPARTMENT Provider Note   CSN: [id number] Arrival date & time: 06/06/19  1607     History   Chief Complaint No chief complaint on file.   HPI Kathy Frank is a 43 y.o. female.     HPI Level 5 caveat due to altered mental status. Patient presents with mother.  Reportedly is been confused.  Dialysis patient Tuesday Thursday and Saturday.  Had some dialysis done today but was too confused.  Also reported to not go to dialysis on Thursday.  Also was recently used cocaine. Patient states she feels bad Past Medical History:  Diagnosis Date  . Below-knee amputation of left lower extremity ([place])   . CKD (chronic kidney disease) stage 5, GFR less than 15 ml/min (HCC) 06/19/2016   06/30/16: Seen by Dr. Posey Pronto [Nephrology]. Suspect etiologies to include diabetes and hypertension though cannot exclude HIV-associated nephropathy. Advised her against from using excessive Goody powder.  . Contact dermatitis 04/21/2016   07/29/16: Skin biopsy performed by [facility] for acute spongiotic dermatitis consistent with contact dermatitis.  . Cyclical vomiting    THC induced???  . Depression 06/28/2006   Qualifier: Diagnosis of  By: Riccardo Dubin MD, [place]    . Diabetes mellitus type 2 in obese ([place]) 06/28/1994  . DKA (diabetic ketoacidoses) ([place]) 12/2014  . Dyspnea   . Erosive esophagitis   . Esophageal reflux   . Essential hypertension 11/16/2013  . Gastroparesis    ? diabetic  . GERD (gastroesophageal reflux disease)   . History of blood transfusion   . Human immunodeficiency virus (HIV) disease ([place]) 04/23/2016  . Hypertension   . Moderate nonproliferative diabetic retinopathy of both eyes ([place]) 11/21/2014   11/14/14: Noted on retinal imaging; needs follow-up imaging in 6 months  05/22/16: Noted on retinal imaging again; needs follow-up imaging in 6 months  . Normocytic anemia   . TOBACCO USER 06/28/2006   Qualifier: Diagnosis of  By:  Riccardo Dubin MD, Sherren Mocha    . Type II diabetes mellitus ([place]) 1994   diagnosed around 48  . Wound dehiscence   . Wound infection s/p L transmetatarsal amputation     Patient Active Problem List   Diagnosis Date Noted  . Altered mental status 06/06/2019  . Eye redness 06/06/2019  . Dehiscence of incision 05/12/2019  . Rash and other nonspecific skin eruption 05/12/2019  . ESRD on hemodialysis ([place]) 05/02/2019  . Metabolic bone disease [id number]  . Abdominal pain 02/06/2019  . Hx of BKA, left ([place]) 12/16/2018  . Anemia due to chronic kidney disease   . Renal failure (ARF), acute on chronic ([place]) 11/24/2018  . Fall due to stumbling   . Osteomyelitis ([place]) 10/07/2018  . Dehiscence of amputation stump ([place])   . Moderate protein-calorie malnutrition ([place])   . Adjustment disorder with mixed anxiety and depressed mood 08/15/2018  . CKD (chronic kidney disease), stage V ([place])   . Acute on chronic anemia 07/03/2018  . Fecal incontinence 02/04/2018  . Healthcare maintenance 09/17/2017  . Right leg swelling 02/26/2017  . Superficial ulcerative lesion ([place]) 02/02/2017  . Aortic atherosclerosis ([place]) 09/30/2016  . Diabetic foot ulcer associated with diabetes mellitus due to underlying condition ([place]) 07/08/2016  . Human immunodeficiency virus (HIV) disease ([place]) 04/23/2016  . Gastroparesis   . Moderate nonproliferative diabetic retinopathy of both eyes ([place]) 11/21/2014  . Esophageal reflux   . Microscopic hematuria 10/31/2014  . Hypertension 11/16/2013  . TOBACCO USER 06/28/2006  . Depression 06/28/2006  .  Diabetes mellitus type 2 in obese [facility]) 06/28/1994    Past Surgical History:  Procedure Laterality Date  . AMPUTATION Left 07/08/2018   Procedure: AMPUTATION FORTH RAY LEFT FOOT;  Surgeon: [name], MD;  Location: [place];  Service: Orthopedics;  Laterality: Left;  . AMPUTATION Left 08/09/2018   Procedure: Left Transmetatarsal Amputation;  Surgeon: [name], MD;  Location:  [place];  Service: Orthopedics;  Laterality: Left;  . AMPUTATION Left 10/08/2018   Procedure: LEFT BELOW KNEE AMPUTATION;  Surgeon: [name], MD;  Location: [place];  Service: Orthopedics;  Laterality: Left;  . AMPUTATION Left 10/28/2018   Procedure: REVISION BELOW KNEE AMPUTATION;  Surgeon: [name], MD;  Location: [place];  Service: Orthopedics;  Laterality: Left;  . AMPUTATION Left 12/16/2018   Procedure: LEFT ABOVE KNEE AMPUTATION;  Surgeon: [name], MD;  Location: [place];  Service: Orthopedics;  Laterality: Left;  . AV FISTULA PLACEMENT Left 10/14/2018   Procedure: Arteriovenous (Av) Fistula Creation Left Arm;  Surgeon: [name], MD;  Location: [place];  Service: Vascular;  Laterality: Left;  . BASCILIC VEIN TRANSPOSITION Left 04/14/2019   Procedure: BASILIC VEIN TRANSPOSITION SECOND STAGE LEFT ARM;  Surgeon: Rosetta Posner, MD;  Location: [place];  Service: Vascular;  Laterality: Left;  . INSERTION OF DIALYSIS CATHETER Right 04/14/2019   Procedure: INSERTION OF DIALYSIS CATHETER;  Surgeon: Rosetta Posner, MD;  Location: [place];  Service: Vascular;  Laterality: Right;  . LOWER EXTREMITY ANGIOGRAPHY N/A 07/05/2018   Procedure: LOWER EXTREMITY ANGIOGRAPHY;  Surgeon: Serafina Mitchell, MD;  Location: [facility] [place];  Service: Cardiovascular;  Laterality: N/A;  . PERIPHERAL VASCULAR BALLOON ANGIOPLASTY Left 07/05/2018   Procedure: PERIPHERAL VASCULAR BALLOON ANGIOPLASTY;  Surgeon: Serafina Mitchell, MD;  Location: [place] [facility];  Service: Cardiovascular;  Laterality: Left;  SFA  . STUMP REVISION Left 10/19/2018   Procedure: REVISION LEFT BELOW KNEE AMPUTATION;  Surgeon: [name], MD;  Location: [place];  Service: Orthopedics;  Laterality: Left;  . TUBAL LIGATION  2002     OB History   No obstetric history on file.      Home Medications    Prior to Admission medications   Medication Sig Start Date End Date Taking? Authorizing Provider  albuterol (VENTOLIN HFA) 108 (90  Base) MCG/ACT inhaler Inhale 2 puffs into the lungs every 4 (four) hours as needed for wheezing or shortness of breath.    [provider]  amLODipine (NORVASC) 10 MG tablet Take 1 tablet (10 mg total) by mouth daily. 05/12/19   Al Decant, MD  bictegravir-emtricitabine-tenofovir AF (BIKTARVY) 50-200-25 MG TABS tablet Take 1 tablet by mouth daily. 02/23/19   Michel Bickers, MD  calcitRIOL (ROCALTROL) 0.25 MCG capsule Take 1 capsule (0.25 mcg total) by mouth daily. 10/17/18   [name], MD  calcium acetate (PHOSLO) 667 MG capsule Take 2 capsules (1,334 mg total) by mouth 3 (three) times daily with meals. 10/16/18   [name], MD  cephALEXin (KEFLEX) 500 MG capsule Take 1 capsule (500 mg total) by mouth 3 (three) times daily. Patient is on Dialysis and will discuss how to take the medication for skin abscess between HD sessions. 05/17/19   Ulyses Amor, PA-C  citalopram (CELEXA) 20 MG tablet Take two 20 mg tablets (40 mg total) each day for depression. Patient taking differently: Take 20 mg by mouth daily.  03/20/19   Marianna Payment, MD  diphenhydrAMINE (BENADRYL) 50 MG  tablet Take 0.5 tablets (25 mg total) by mouth every 6 (six) hours as needed for itching (As needed for itching). 05/03/19   Santos-Sanchez, Merlene Morse, MD  gabapentin (NEURONTIN) 300 MG capsule Take 1 capsule (300 mg total) by mouth daily as needed. 05/03/19   [name], MD  glucose blood (ACCU-CHEK GUIDE) test strip USE TO TEST BLOOD SUGAR 3 TIMES DAILY . Diagnosis code  E11.69, E66.9 07/09/17   Lars Mage, MD  hydrALAZINE (APRESOLINE) 25 MG tablet Take 1 tablet (25 mg total) by mouth 3 (three) times daily. 05/12/19 06/11/19  Al Decant, MD  Insulin Glargine (LANTUS) 100 UNIT/ML Solostar Pen Inject 5 Units into the skin at bedtime. 05/12/19   Al Decant, MD  liraglutide (VICTOZA) 18 MG/3ML SOPN Inject 0.2 mLs (1.2 mg total) into the skin daily. 05/12/19   Al Decant, MD  multivitamin (RENA-VIT)  TABS tablet Take 1 tablet by mouth daily. 04/27/19   [provider]  oxyCODONE-acetaminophen (PERCOCET/ROXICET) 5-325 MG tablet Take 1 tablet by mouth every 6 (six) hours as needed (pain). 04/14/19   Dagoberto Ligas, PA-C  triamcinolone (KENALOG) 0.025 % ointment Apply 1 application topically 2 (two) times daily. 05/15/19   Al Decant, MD    Family History Family History  Problem Relation Age of Onset  . Diabetes Mother   . Diabetes Brother   . Diabetes Daughter   . Diabetes Daughter   . Mental retardation Brother        died from PNA  . Diabetes Maternal Grandmother     Social History Social History   Tobacco Use  . Smoking status: Former Smoker    Packs/day: 0.10    Years: 10.00    Pack years: 1.00    Types: Cigarettes    Start date: 11/17/2013    Quit date: 09/09/2014    Years since quitting: 4.7  . Smokeless tobacco: Never Used  Substance Use Topics  . Alcohol use: Yes    Comment: rare  . Drug use: Yes    Frequency: 4.0 times per week    Types: Marijuana     Allergies   Patient has no known allergies.   Review of Systems Review of Systems  Unable to perform ROS: Mental status change     Physical Exam Updated Vital Signs BP (!) 152/90   Pulse 92   Temp 97.9 F (36.6 C)   Resp 12   Wt 69.9 kg   SpO2 100%   BMI 25.64 kg/m   Physical Exam Vitals signs and nursing note reviewed.  Constitutional:      Comments: Patient sitting in bed with eyes closed.  HENT:     Head: Normocephalic.  Eyes:     Comments: Patient initially held her eyes closed.  Neck:     Musculoskeletal: Neck supple.  Cardiovascular:     Rate and Rhythm: Regular rhythm.  Pulmonary:     Breath sounds: No wheezing or rhonchi.     Comments: Right-sided dialysis catheter. Abdominal:     Tenderness: There is no abdominal tenderness.  Musculoskeletal:        General: No tenderness.  Skin:    General: Skin is warm.  Neurological:     Comments: Answer some questions but  really cannot provide history.      ED Treatments / Results  Labs (all labs ordered are listed, but only abnormal results are displayed) Labs Reviewed  COMPREHENSIVE METABOLIC PANEL - Abnormal; Notable for the following components:      Result  Value   Chloride 95 (*)    CO2 21 (*)    Glucose, Bld 69 (*)    BUN 36 (*)    Creatinine, Ser 5.73 (*)    Calcium 8.4 (*)    Total Protein 9.2 (*)    Alkaline Phosphatase 140 (*)    GFR calc non Af Amer 8 (*)    GFR calc Af Amer 10 (*)    Anion gap 20 (*)    All other components within normal limits  CBC WITH DIFFERENTIAL/PLATELET - Abnormal; Notable for the following components:   Hemoglobin 11.8 (*)    RDW 17.1 (*)    nRBC 1.6 (*)    All other components within normal limits  CBG MONITORING, ED - Abnormal; Notable for the following components:   Glucose-Capillary 65 (*)    All other components within normal limits  CBG MONITORING, ED - Abnormal; Notable for the following components:   Glucose-Capillary 152 (*)    All other components within normal limits  SARS CORONAVIRUS 2 (TAT 6-24 HRS)  CULTURE, BLOOD (ROUTINE X 2)  CULTURE, BLOOD (ROUTINE X 2)  ETHANOL  SALICYLATE LEVEL  URINALYSIS, ROUTINE W REFLEX MICROSCOPIC  CBC  RENAL FUNCTION PANEL  HEMOGLOBIN A1C    EKG EKG Interpretation  Date/Time:  Tuesday June 06 2019 16:26:55 EDT Ventricular Rate:  93 PR Interval:    QRS Duration: 94 QT Interval:  402 QTC Calculation: 500 R Axis:   90 Text Interpretation: Sinus tachycardia Ventricular premature complex Borderline right axis deviation Probable left ventricular hypertrophy Abnormal T, consider ischemia, lateral leads Baseline wander in lead(s) V5 V6 Confirmed by Davonna Belling [phone]) on 06/06/2019 7:33:34 PM   Radiology Ct Head Wo Contrast  Result Date: 06/06/2019 CLINICAL DATA:  Altered level of consciousness and disorientation. Chronic renal failure. EXAM: CT HEAD WITHOUT CONTRAST TECHNIQUE: Contiguous axial  images were obtained from the base of the skull through the vertex without intravenous contrast. COMPARISON:  None. FINDINGS: Brain: The ventricles are normal in size and configuration. There is a cavum septum pellucidum, an anatomic variant. There is no intracranial mass, hemorrhage, extra-axial fluid collection, or midline shift. There is a focal area of decreased attenuation in the anterior right centrum semiovale at the mid ventricular level which may represent a recent and possibly acute white matter infarct. This focus is located just superior to the right basal ganglia anteriorly. Elsewhere brain parenchyma appears unremarkable. Vascular: There is no hyperdense vessel. There is calcification in each carotid siphon region. Skull: The bony calvarium appears intact. Sinuses/Orbits: There is opacification in several ethmoid air cells bilaterally and in the left sphenoid sinus. Orbits appear symmetric bilaterally. Other: Mastoid air cells are clear. IMPRESSION: Suspect recent and potentially acute infarct in the right anterior centrum semiovale, just superior to the right basal ganglia. Elsewhere brain parenchyma appears unremarkable. No mass or hemorrhage. There are foci of paranasal sinus disease at several sites. There are foci of arterial vascular calcification. Electronically Signed   By: Lowella Grip III M.D.   On: 06/06/2019 21:10   Dg Chest Portable 1 View  Result Date: 06/06/2019 CLINICAL DATA:  Chest pain and shortness of breath EXAM: PORTABLE CHEST 1 VIEW COMPARISON:  May 02, 2019 FINDINGS: The mediastinal contour is normal. The heart size is mildly enlarged. Right central venous line is identified unchanged. There is no focal infiltrate, pulmonary edema, or pleural effusion. The bony structures are normal. IMPRESSION: No acute cardiopulmonary disease identified.  Mild cardiomegaly. Electronically Signed  By: Abelardo Diesel M.D.   On: 06/06/2019 17:25    Procedures Procedures  (including critical care time)  Medications Ordered in ED Medications  bictegravir-emtricitabine-tenofovir AF (BIKTARVY) 50-200-25 MG per tablet 1 tablet (has no administration in time range)  calcium acetate (PHOSLO) capsule 1,334 mg (has no administration in time range)  calcitRIOL (ROCALTROL) capsule 0.25 mcg (has no administration in time range)  multivitamin (RENA-VIT) tablet 1 tablet (has no administration in time range)  triamcinolone ointment (KENALOG) 0.1 % 1 application (has no administration in time range)  heparin injection 5,000 Units (has no administration in time range)  sodium chloride flush (NS) 0.9 % injection 3 mL (3 mLs Intravenous Not Given 06/06/19 2304)  acetaminophen (TYLENOL) tablet 650 mg (has no administration in time range)    Or  acetaminophen (TYLENOL) suppository 650 mg (has no administration in time range)  insulin aspart (novoLOG) injection 0-15 Units (has no administration in time range)  naphazoline-glycerin (CLEAR EYES REDNESS) ophth solution 1-2 drop (has no administration in time range)  LORazepam (ATIVAN) injection 1 mg (1 mg Intravenous Given 06/06/19 1728)  dextrose 50 % solution 25 mL (25 mLs Intravenous Given 06/06/19 1940)  tetracaine (PONTOCAINE) 0.5 % ophthalmic solution 2 drop (2 drops Both Eyes Given 06/06/19 2306)  fluorescein ophthalmic strip 1 strip (1 strip Both Eyes Given 06/06/19 2306)     Initial Impression / Assessment and Plan / ED Course  I have reviewed the triage vital signs and the nursing notes.  Pertinent labs & imaging results that were available during my care of the patient were reviewed by me and considered in my medical decision making (see chart for details).        Patient brought in for mental status changes.  Confusion.  Missed most of dialysis today and was not dialyzed on Saturday.  Dialyzed through the chest wall catheter.  Head CT showed potential stroke but otherwise exam appears to be nonfocal.  Potassium  reassuring.  X-ray reassuring.  However still confused.  Will admit to internal medicine residents.  Patient also reportedly recently used cocaine. Patient had hypoglycemia while in the ER.  Improved with eating and D50.  Discussed with admitting physicians.  Patient more cooperative and complaining of eye pain.  Complaining of pain both eyes.  Intraocular pressure on the right is 18 and on left is 25.  There is some photophobia on the left side.  Some mild corneal uptake particularly at around the 9 o'clock position.  Eye movements appear intact.   Final Clinical Impressions(s) / ED Diagnoses   Final diagnoses:  Encephalopathy  End stage renal disease on dialysis [facility])    ED Discharge Orders    None       Davonna Belling, MD 06/06/19 2129    Davonna Belling, MD 06/06/19 2320    Davonna Belling, MD 07/03/19 0800

## 2019-06-06 NOTE — ED Notes (Signed)
IV Team at bedside 

## 2019-06-06 NOTE — ED Notes (Signed)
Patient transported to CT 

## 2019-06-06 NOTE — H&P (Addendum)
Date: 06/06/2019               Patient Name:  Kathy Frank MRN: [id number]  DOB: 1976/06/11 Age / Sex: 43 y.o., female   PCP: Lars Mage, MD         Medical Service: Internal Medicine Teaching Service         Attending Physician: Dr. Velna Ochs, MD    First Contact: Darrick Meigs, MD, Rylee Pager: [place] [phone])  Second Contact: Hedwig Morton, Jaimie PagerJari Pigg [phone])       After Hours (After 5p/  First Contact Pager: [phone]  weekends / holidays): Second Contact Pager: [phone]   Chief Complaint: fatigue and eye pain   History of Present Illness: 43 y.o.  female w/ PMH significant for HTN, HIV, DMII, and ESRD on HD TTS presenting with altered mental status. History obtained by patient, patient's mother at bedside and chart review. Patient is poor historian. She reports that she has had a headache for several days for which she has been taking increasing amounts of BC powder. She missed her HD session on Saturday due to transportation issues and subsequently was feeling "unwell". Per mother, the patient has not been acting like herself for past few days. Patient was at her HD session today when she was noted to be confused and was sent to the ED for further evaluation. She denies any headache, dizziness/lightheadedness, fevers/chills, chest pain, shortness of breath, nausea/vomiting, abdominal pain, diarrhea, urinary symptoms, or recent sick contacts.   Of note, patient reports that her eyes have been burning for several days. However, after her dialysis session today, she developed acute onset of blurry vision with a foreign body sensation. She notes increased eye pressure every time she tries to open her eyes. She also has watery discharge from her left eye. She denies any direct trauma.   ED course:  Patient brought in by her mother from her dialysis center for concerns of mental status changes. CT head concerning for potential stroke; however no focal deficits on  examination. CXR without any acute cardiopulmonary processes. No significant electrolyte abnormalities. Patient admitted to internal medicine for further evaluation and management.    Meds:  No outpatient medications have been marked as taking for the 06/06/19 encounter [facility] Encounter).     Allergies: Allergies as of 06/06/2019   (No Known Allergies)   Past Medical History:  Diagnosis Date   Below-knee amputation of left lower extremity (HCC)    CKD (chronic kidney disease) stage 5, GFR less than 15 ml/min ([place]) 06/19/2016   06/30/16: Seen by Dr. Posey Pronto [Nephrology]. Suspect etiologies to include diabetes and hypertension though cannot exclude HIV-associated nephropathy. Advised her against from using excessive Goody powder.   Contact dermatitis 04/21/2016   07/29/16: Skin biopsy performed by [facility] for acute spongiotic dermatitis consistent with contact dermatitis.   Cyclical vomiting    THC induced???   Depression 06/28/2006   Qualifier: Diagnosis of  By: Riccardo Dubin MD, Todd     Diabetes mellitus type 2 in obese ([place]) 06/28/1994   DKA (diabetic ketoacidoses) ([place]) 12/2014   Dyspnea    Erosive esophagitis    Esophageal reflux    Essential hypertension 11/16/2013   Gastroparesis    ? diabetic   GERD (gastroesophageal reflux disease)    History of blood transfusion    Human immunodeficiency virus (HIV) disease ([place]) 04/23/2016   Hypertension    Moderate nonproliferative diabetic retinopathy of both eyes ([place]) 11/21/2014  11/14/14: Noted on retinal imaging; needs follow-up imaging in 6 months  05/22/16: Noted on retinal imaging again; needs follow-up imaging in 6 months   Normocytic anemia    TOBACCO USER 06/28/2006   Qualifier: Diagnosis of  By: Riccardo Dubin MD, Todd     Type II diabetes mellitus ([place]) 1994   diagnosed around 1994   Wound dehiscence    Wound infection s/p L transmetatarsal amputation     Family History:  Family  History  Problem Relation Age of Onset   Diabetes Mother    Diabetes Brother    Diabetes Daughter    Diabetes Daughter    Mental retardation Brother        died from PNA   Diabetes Maternal Grandmother      Social History:  Social History   Tobacco Use   Smoking status: Former Smoker    Packs/day: 0.10    Years: 10.00    Pack years: 1.00    Types: Cigarettes    Start date: 11/17/2013    Quit date: 09/09/2014    Years since quitting: 4.7   Smokeless tobacco: Never Used  Substance Use Topics   Alcohol use: Yes    Comment: rare   Drug use: Yes    Frequency: 4.0 times per week    Types: Marijuana     Review of Systems: A complete ROS was negative except as per HPI.   Physical Exam: Blood pressure (!) 152/90, pulse 92, temperature 97.9 F (36.6 C), resp. rate 12, weight 69.9 kg, SpO2 100 %. Physical Exam Vitals signs and nursing note reviewed.  Constitutional:      General: She is not in acute distress.    Appearance: Normal appearance. She is ill-appearing. She is not diaphoretic.  HENT:     Head: Normocephalic and atraumatic.     Mouth/Throat:     Mouth: Mucous membranes are moist.     Pharynx: Oropharynx is clear.  Eyes:     General:        Left eye: Discharge present.    Pupils: Pupils are equal, round, and reactive to light.     Comments: Patient unable to keep eyes open on examination due to "pressure" Bilateral conjunctival injection noted; +ve photophobia PERRL and EOM grossly intact  Decreased peripheral vision on right lower eye field; unable to assess vision via Snellen chart Intraocular pressure: 18 on right, 25 on left Slit lamp exam: mild corneal uptake at 9 o'clock position in left eye Fundoscopic exam without acute changes    Neck:     Musculoskeletal: Normal range of motion and neck supple.  Cardiovascular:     Rate and Rhythm: Normal rate and regular rhythm.     Pulses: Normal pulses.     Heart sounds: Murmur present. No friction  rub. No gallop.   Pulmonary:     Effort: Pulmonary effort is normal. No respiratory distress.     Breath sounds: Normal breath sounds. No wheezing, rhonchi or rales.  Chest:     Chest wall: No tenderness.  Abdominal:     General: Bowel sounds are normal. There is no distension.     Palpations: Abdomen is soft.     Tenderness: There is no abdominal tenderness. There is no guarding or rebound.  Musculoskeletal: Normal range of motion.        General: No swelling or tenderness.  Skin:    General: Skin is warm and dry.     Capillary Refill: Capillary refill takes less  than 2 seconds.     Findings: Lesion present.     Comments: Multiple chronic nodular lesions noted on RLE, no s/s of infection  Neurological:     General: No focal deficit present.     Mental Status: She is alert and oriented to person, place, and time. Mental status is at baseline.     Sensory: No sensory deficit.     Motor: No weakness.     Coordination: Coordination normal.     EKG: personally reviewed my interpretation is sinus tachycardia with PVC's, T wave inversion in aVR, aVL, V1, and V2   CXR: personally reviewed my interpretation is no acute cardiopulmonary disease.   CT HEAD W/O CONTRAST:  IMPRESSION: Suspect recent and potentially acute infarct in the right anterior centrum semiovale, just superior to the right basal ganglia. Elsewhere brain parenchyma appears unremarkable. No mass or hemorrhage. There are foci of paranasal sinus disease at several sites. There are foci of arterial vascular calcification.  MRI BRAIN WO CONTRAST: IMPRESSION: 1. Truncated examination due to patient discomfort. 2. No acute intracranial process. 3. 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  Assessment & Plan by Problem:  Patient is a 43yo female with hx of HTN, HIV, DMII, ESRD on HD TTS and diabetic retinopathy presenting with altered mental status from her dialysis center today after missing a session on Saturday. She is also  complaining of bilateral eye pressure and foreign body sensation; eye exam concerning for corneal abrasion.   Altered mental status: Unclear etiology. Patient presented from her dialysis center after she was found to have altered mental status during her session. She reports missing a session on Saturday as well due to transportation issues. Today, she was at her dialysis center when she was noted to be confused and her session was cut short.  Mother notes that patient has not been "acting herself" for past few days. Patient reports taking excess BC powder over the past several days for headaches and believes that could be contributing to her confusion. Of note, she also reports recent cocaine use. On exam, patient is ill-appearing and lethargic but alert and oriented x4 and answering questions appropriately. She is afebrile and hemodynamically stable. No urinary symptoms. No signs of infection noted around her chest wall catheter. No other focal deficits noted on exam. CXR without any acute cardiopulmonary disease. CT Head concerning for possible acute infarct in right anterior centrum semiovale. However, MRI brain without acute intracranial process and centrum semiovale infarct on right deemed to be nonacute.  - f/u blood culture and UA - HD per nephrology - Holding centrally acting meds and BP meds  Eye pressure: Patient has a history of bilateral diabetic retinopathy reports a "burning sensation" of eyes for several days. However, reports acute onset of blurry vision with foreign body sensation after her dialysis session. On examination, patient is unable to keep her eyes open due to pressure on her eyes. She has bilateral conjunctival injection with clear watery discharge from the left eye. Pupils are reactive to light and EOM are grossly intact. However, patient with decreased peripheral vision in the right lower visual field. In the ED, patient has increased intraocular pressure (18 in right, 25 in  left) with mild corneal uptake on slit lamp eye exam at 9 o'clock position in left eye. Patient discussed with Dr. Alanda Slim, ophthalmologist. Recommended ophthalmic erythromycin ointment for corneal abrasion.  - Erythromycin ointment bilateral q8h - Continue to monitor   ESRD: Patient with history of ESRD on HD  TTS. She reportedly missed her HD session on Saturday due to transportation issues. She went to HD earlier today but was unable to complete her session due to development of AMS. BUN/Cr 36/5.73, GFR 10, Na 136, K 3.9, CO2 21. Patient does not appear uremic on exam. No urgent indication for HD at this time. - Nephrology consult for HD - f/u renal function panel  - Continue Rena vitamin 1 tablet - Continue calcium acetate 1334mg  tid  - Continue cacitriol 0.41mcg qd  Diabetes mellitus:  Patient with Hx of DM on lantus 5U and victoza 0.63ml qd. On presentation, patient was hypoglycemic (65) and received 57ml D50.  - CBG monitoring - SSI  HIV:  Patient with history of HIV on Biktarvy. Patient follows with ID and was last seen in July at which point her viral load was undetectable and CD4 526.  - Continue Biktarvy qd  Hypertension:  Patient has hx of HTN and is on amlodipine 10mg  and hydralazine 25mg  tid at home. SBP 170s on presentation. However, given concern for possible stroke, will hold home BP meds for now.  - Holding BP meds   Diet: Renal  Code: Full  VTE Prophylaxis: Heparin 5000U   Dispo: Admit patient to Inpatient with expected length of stay greater than 2 midnights.  Signed: Harvie Heck, MD  Internal Medicine, PGY-1 06/06/2019, 11:58 PM  Pager: [phone]

## 2019-06-06 NOTE — ED Triage Notes (Signed)
Pt here for disorientation and AMS since 11 am this morning. Pt is a dialysis patient on Tuesday Thursday and Saturday. Pt is lethargic. Pt took 5-6 BC powders yesterday. Pt did not go to dialysis on Saturday.

## 2019-06-06 NOTE — ED Notes (Signed)
Possible Infiltration of the IV previously placed. IV Team consult placed to assess IV and determine need for another.

## 2019-06-06 NOTE — ED Notes (Signed)
Pt stable and resting in bed comfortably with no complaints.

## 2019-06-07 ENCOUNTER — Inpatient Hospital Stay (HOSPITAL_COMMUNITY): Payer: Medicaid Other

## 2019-06-07 DIAGNOSIS — Z992 Dependence on renal dialysis: Secondary | ICD-10-CM | POA: Insufficient documentation

## 2019-06-07 DIAGNOSIS — N186 End stage renal disease: Secondary | ICD-10-CM | POA: Insufficient documentation

## 2019-06-07 DIAGNOSIS — Z791 Long term (current) use of non-steroidal anti-inflammatories (NSAID): Secondary | ICD-10-CM

## 2019-06-07 DIAGNOSIS — S0502XA Injury of conjunctiva and corneal abrasion without foreign body, left eye, initial encounter: Secondary | ICD-10-CM | POA: Diagnosis present

## 2019-06-07 LAB — HEMOGLOBIN A1C
Hgb A1c MFr Bld: 6.4 % — ABNORMAL HIGH (ref 4.8–5.6)
Mean Plasma Glucose: 136.98 mg/dL

## 2019-06-07 LAB — RENAL FUNCTION PANEL
Albumin: 3.4 g/dL — ABNORMAL LOW (ref 3.5–5.0)
Anion gap: 21 — ABNORMAL HIGH (ref 5–15)
BUN: 41 mg/dL — ABNORMAL HIGH (ref 6–20)
CO2: 20 mmol/L — ABNORMAL LOW (ref 22–32)
Calcium: 7.9 mg/dL — ABNORMAL LOW (ref 8.9–10.3)
Chloride: 93 mmol/L — ABNORMAL LOW (ref 98–111)
Creatinine, Ser: 6.72 mg/dL — ABNORMAL HIGH (ref 0.44–1.00)
GFR calc Af Amer: 8 mL/min — ABNORMAL LOW (ref >60)
GFR calc non Af Amer: 7 mL/min — ABNORMAL LOW (ref >60)
Glucose, Bld: 101 mg/dL — ABNORMAL HIGH (ref 70–99)
Phosphorus: 6.8 mg/dL — ABNORMAL HIGH (ref 2.5–4.6)
Potassium: 4.7 mmol/L (ref 3.5–5.1)
Sodium: 134 mmol/L — ABNORMAL LOW (ref 135–145)

## 2019-06-07 LAB — CBC
HCT: 39.4 % (ref 36.0–46.0)
Hemoglobin: 12.6 g/dL (ref 12.0–15.0)
MCH: 29.9 pg (ref 26.0–34.0)
MCHC: 32 g/dL (ref 30.0–36.0)
MCV: 93.6 fL (ref 80.0–100.0)
Platelets: 263 10*3/uL (ref 150–400)
RBC: 4.21 MIL/uL (ref 3.87–5.11)
RDW: 17.3 % — ABNORMAL HIGH (ref 11.5–15.5)
WBC: 5.4 10*3/uL (ref 4.0–10.5)
nRBC: 1.1 % — ABNORMAL HIGH (ref 0.0–0.2)

## 2019-06-07 LAB — CBG MONITORING, ED: Glucose-Capillary: 200 mg/dL — ABNORMAL HIGH (ref 70–99)

## 2019-06-07 MED ORDER — ATORVASTATIN CALCIUM 40 MG PO TABS: 40.0000 mg | ORAL_TABLET | Freq: Every day | ORAL | Status: DC

## 2019-06-07 MED ORDER — ASPIRIN 325 MG PO TABS
325.0000 mg | ORAL_TABLET | Freq: Once | ORAL | Status: AC
  Administered 2019-06-07: 325 mg via ORAL
  Filled 2019-06-07: qty 1

## 2019-06-07 MED ORDER — ERYTHROMYCIN 5 MG/GM OP OINT
TOPICAL_OINTMENT | Freq: Three times a day (TID) | OPHTHALMIC | Status: DC
  Filled 2019-06-07: qty 3.5

## 2019-06-07 MED ORDER — POLYVINYL ALCOHOL 1.4 % OP SOLN
2.0000 [drp] | Freq: Two times a day (BID) | OPHTHALMIC | Status: DC
  Filled 2019-06-07: qty 15

## 2019-06-07 MED ORDER — POLYVINYL ALCOHOL 1.4 % OP SOLN
2.0000 [drp] | OPHTHALMIC | Status: DC | PRN
  Filled 2019-06-07: qty 15

## 2019-06-07 MED ORDER — POLYVINYL ALCOHOL 1.4 % OP SOLN: 2.0000 [drp] | OPHTHALMIC | 0 refills | Status: DC | PRN

## 2019-06-07 MED ORDER — ERYTHROMYCIN 5 MG/GM OP OINT: TOPICAL_OINTMENT | Freq: Three times a day (TID) | OPHTHALMIC | 0 refills | Status: AC

## 2019-06-07 MED FILL — ERYTHROMYCIN EYE OINTMENT: 5 | 4 days supply | Qty: 4 | Fill #0

## 2019-06-07 NOTE — Progress Notes (Signed)
Subjective: Patient was seen this morning on rounds.  Patient was alert and oriented to person place time and event.  Patient states that prior to being admitted to the hospital the patient had taken several packs of Goody powders.  She began to feel confused and was admitted at [facility].  Patient is alert and oriented this time.  Patient denies chest pain, shortness of breath, abdominal pain, problems with bowel or bladder.  Patient does admit to left thigh pain.  Pain has improved since starting the erythromycin ointment.  Otherwise patient states that she the pain is worse when she touches or moves her left eye.  But denies sensitivity to light. She states that she has chronic worsening of visions as was using eye glasses previously. She denies decrease in vision as a result of her eye pain. She denies trauma to her eye.   Objective:  Vital signs in last 24 hours: Vitals:   06/07/19 0445 06/07/19 0530 06/07/19 0600 06/07/19 0630  BP:      Pulse: 89     Resp: 11 19 16 18   Temp:      SpO2: 96%     Weight:        Physical Exam: Physical Exam  Constitutional: She is oriented to person, place, and time. No distress.  HENT:  Head: Atraumatic.  Eyes: EOM are normal. Left conjunctiva is injected. Left eye exhibits normal extraocular motion and no nystagmus.  Patient had slit light exam and fluorescin exam in the ED. Pain with EOM of the left eye.  Neck: Normal range of motion.  Cardiovascular: Normal rate, regular rhythm, normal heart sounds and intact distal pulses. Exam reveals no gallop and no friction rub.  No murmur heard. Pulmonary/Chest: Effort normal and breath sounds normal. No respiratory distress. She exhibits no tenderness.  Abdominal: Soft. Bowel sounds are normal. She exhibits no distension. There is no abdominal tenderness.  Musculoskeletal: Normal range of motion.        General: No tenderness or edema.     Comments: BKA of left leg  Neurological: She is alert and  oriented to person, place, and time.  Skin: Skin is warm and dry. She is not diaphoretic.    Pertinent labs/Imaging: CBC Latest Ref Rng & Units 06/07/2019 06/06/2019 05/03/2019  WBC 4.0 - 10.5 K/uL 5.4 7.3 -  Hemoglobin 12.0 - 15.0 g/dL 12.6 11.8(L) 8.6(L)  Hematocrit 36.0 - 46.0 % 39.4 37.3 25.9(L)  Platelets 150 - 400 K/uL 263 292 -   CMP Latest Ref Rng & Units 06/07/2019 06/06/2019 05/03/2019  Glucose 70 - 99 mg/dL 101(H) 69(L) 108(H)  BUN 6 - 20 mg/dL 41(H) 36(H) 38(H)  Creatinine 0.44 - 1.00 mg/dL 6.72(H) 5.73(H) 6.70(H)  Sodium 135 - 145 mmol/L 134(L) 136 138  Potassium 3.5 - 5.1 mmol/L 4.7 3.9 4.0  Chloride 98 - 111 mmol/L 93(L) 95(L) 105  CO2 22 - 32 mmol/L 20(L) 21(L) 20(L)  Calcium 8.9 - 10.3 mg/dL 7.9(L) 8.4(L) 6.9(L)  Total Protein 6.5 - 8.1 g/dL - 9.2(H) 6.5  Total Bilirubin 0.3 - 1.2 mg/dL - 0.8 0.4  Alkaline Phos 38 - 126 U/L - 140(H) 107  AST 15 - 41 U/L - 30 13(L)  ALT 0 - 44 U/L - 20 9   CXR: Suspect recent and potentially acute infarct in the right anterior centrum semiovale, just superior to the right basal ganglia. Elsewhere brain parenchyma appears unremarkable. No mass or hemorrhage.  There are foci of paranasal sinus disease  at several sites. There are foci of arterial vascular calcification.  CT head wo contrast: Suspect recent and potentially acute infarct in the right anterior centrum semiovale, just superior to the right basal ganglia. Elsewhere brain parenchyma appears unremarkable. No mass or hemorrhage.  There are foci of paranasal sinus disease at several sites. There are foci of arterial vascular calcification.  MR brain wo contrast:  1. Truncated examination due to patient discomfort. 2. No acute intracranial process. 3. 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  Assessment/Plan:  Principal Problem:   Altered mental status Active Problems:   Eye redness   Left corneal abrasion   Patient Summary: Kathy Frank is a 43 year old  female with a pertinent past medical history of hypertension, HIV, diabetes mellitus, ESRD (TTS), and diabetic retinopathy who presented to [facility] from her dialysis center with altered mental status with concern for acute encephalopathy secondary to ingestion so of multiple "Goody's powder".  She also complained of eye pressure with a foreign body sensation due to a corneal abrasion of her left eye.  On evaluation today, the patient is alert and oriented to person place time and event.   Salicylate and EtOH levels are WNL. Nephrology states that she does not need emergent dialysis. Patient had a fluorescein and slit lap exam in the ED which showed a left corneal abrasion. The patient was started on erythromycin every 6 hours and her pain has slightly improved.  Patient denies any additional symptoms and is stable for discharge with follow-up at [facility] for her corneal abrasion and at her outpatient dialysis clinic.  #Altered Mental Status - Resolved - Salicylate and EtOH levels are WNL.   #ESRD: -We will changes in electrolytes that would warrant emergent dialysis. -Patient is not currently uremic.  #Corneal Abrasion: -Continue erythromycin ointment every 6 hours for 4 additional days. -Use artificial tears as needed for eye irritation.   Diet: Renal IVF: None VTE: Heparin Code: Full  Dispo: Anticipated discharge today.   Marianna Payment, MD 06/07/2019, 12:39 PM Pager: [phone]

## 2019-06-07 NOTE — ED Notes (Signed)
Renal/ Carb Modified Diet ordered for Lunch.

## 2019-06-07 NOTE — ED Notes (Signed)
Physicians at bedside. Pt eating breakfast.

## 2019-06-07 NOTE — Consult Note (Addendum)
[facility] Renal Consultation Note    Indication for Consultation:  Management of ESRD/hemodialysis; anemia, hypertension/volume and secondary hyperparathyroidism  [id number], Verne Spurr, MD  HPI: Kathy Frank is a 43 y.o. female. ESRD on HD TTS at [facility], new start from office, first starting on 04/25/2019.  Past medical history significant for DM, HTN. S/P L BKA, DKA, gastroparesis, diabetic retinopathy,  erosive esophagitis, AOCD, SHPT.  Patient has been mostly compliant with dialysis, but typically gains 3-6L between treatments.   Patient presented to the ED due to AMS.  Reports she took "way too many" Goody's powders due to cramping in dialysis. Patient is unsure how much fluid she gains between treatments, but typically eats a large "cook out cup" worth of ice a day.  Mother brought her to the ED because of AMS during dialysis.  Denies HA, SOB, CP, n/v/d, fever, chills, and dizziness.    Pertinent findings include CT concerning for possible stroke, MRI showed no acute findings and an old infarct, salicylate <7.    Past Medical History:  Diagnosis Date  . Below-knee amputation of left lower extremity ([place])   . CKD (chronic kidney disease) stage 5, GFR less than 15 ml/min (HCC) 06/19/2016   06/30/16: Seen by Dr. Posey Pronto [Nephrology]. Suspect etiologies to include diabetes and hypertension though cannot exclude HIV-associated nephropathy. Advised her against from using excessive Goody powder.  . Contact dermatitis 04/21/2016   07/29/16: Skin biopsy performed by [facility] notable for acute spongiotic dermatitis consistent with contact dermatitis.  . Cyclical vomiting    THC induced???  . Depression 06/28/2006   Qualifier: Diagnosis of  By: Riccardo Dubin MD, [place]    . Diabetes mellitus type 2 in obese ([place]) 06/28/1994  . DKA (diabetic ketoacidoses) ([place]) 12/2014  . Dyspnea   . Erosive esophagitis   . Esophageal reflux   . Essential hypertension 11/16/2013  .  Gastroparesis    ? diabetic  . GERD (gastroesophageal reflux disease)   . History of blood transfusion   . Human immunodeficiency virus (HIV) disease ([place]) 04/23/2016  . Hypertension   . Moderate nonproliferative diabetic retinopathy of both eyes ([place]) 11/21/2014   11/14/14: Noted on retinal imaging; needs follow-up imaging in 6 months  05/22/16: Noted on retinal imaging again; needs follow-up imaging in 6 months  . Normocytic anemia   . TOBACCO USER 06/28/2006   Qualifier: Diagnosis of  By: Riccardo Dubin MD, Sherren Mocha    . Type II diabetes mellitus ([place]) 1994   diagnosed around 22  . Wound dehiscence   . Wound infection s/p L transmetatarsal amputation    Past Surgical History:  Procedure Laterality Date  . AMPUTATION Left 07/08/2018   Procedure: AMPUTATION FORTH RAY LEFT FOOT;  Surgeon: [name], MD;  Location: [place];  Service: Orthopedics;  Laterality: Left;  . AMPUTATION Left 08/09/2018   Procedure: Left Transmetatarsal Amputation;  Surgeon: [name], MD;  Location: [place];  Service: Orthopedics;  Laterality: Left;  . AMPUTATION Left 10/08/2018   Procedure: LEFT BELOW KNEE AMPUTATION;  Surgeon: [name], MD;  Location: [place];  Service: Orthopedics;  Laterality: Left;  . AMPUTATION Left 10/28/2018   Procedure: REVISION BELOW KNEE AMPUTATION;  Surgeon: [name], MD;  Location: [place];  Service: Orthopedics;  Laterality: Left;  . AMPUTATION Left 12/16/2018   Procedure: LEFT ABOVE KNEE AMPUTATION;  Surgeon: [name], MD;  Location: [place];  Service: Orthopedics;  Laterality: Left;  . AV FISTULA PLACEMENT Left 10/14/2018  Procedure: Arteriovenous (Av) Fistula Creation Left Arm;  Surgeon: [name], MD;  Location: [place];  Service: Vascular;  Laterality: Left;  . BASCILIC VEIN TRANSPOSITION Left 04/14/2019   Procedure: BASILIC VEIN TRANSPOSITION SECOND STAGE LEFT ARM;  Surgeon: Rosetta Posner, MD;  Location: [place];  Service: Vascular;  Laterality: Left;  . INSERTION OF  DIALYSIS CATHETER Right 04/14/2019   Procedure: INSERTION OF DIALYSIS CATHETER;  Surgeon: Rosetta Posner, MD;  Location: [place];  Service: Vascular;  Laterality: Right;  . LOWER EXTREMITY ANGIOGRAPHY N/A 07/05/2018   Procedure: LOWER EXTREMITY ANGIOGRAPHY;  Surgeon: Serafina Mitchell, MD;  Location: [place] [facility];  Service: Cardiovascular;  Laterality: N/A;  . PERIPHERAL VASCULAR BALLOON ANGIOPLASTY Left 07/05/2018   Procedure: PERIPHERAL VASCULAR BALLOON ANGIOPLASTY;  Surgeon: Serafina Mitchell, MD;  Location: [place] [facility];  Service: Cardiovascular;  Laterality: Left;  SFA  . STUMP REVISION Left 10/19/2018   Procedure: REVISION LEFT BELOW KNEE AMPUTATION;  Surgeon: [name], MD;  Location: [place];  Service: Orthopedics;  Laterality: Left;  . TUBAL LIGATION  2002   Family History  Problem Relation Age of Onset  . Diabetes Mother   . Diabetes Brother   . Diabetes Daughter   . Diabetes Daughter   . Mental retardation Brother        died from PNA  . Diabetes Maternal Grandmother    Social History:  reports that she quit smoking about 4 years ago. Her smoking use included cigarettes. She started smoking about 5 years ago. She has a 1.00 pack-year smoking history. She has never used smokeless tobacco. She reports current alcohol use. She reports current drug use. Frequency: 4.00 times per week. Drug: Marijuana. No Known Allergies Prior to Admission medications   Medication Sig Start Date End Date Taking? Authorizing Provider  albuterol (VENTOLIN HFA) 108 (90 Base) MCG/ACT inhaler Inhale 2 puffs into the lungs every 4 (four) hours as needed for wheezing or shortness of breath.    [provider]  amLODipine (NORVASC) 10 MG tablet Take 1 tablet (10 mg total) by mouth daily. 05/12/19   Al Decant, MD  bictegravir-emtricitabine-tenofovir AF (BIKTARVY) 50-200-25 MG TABS tablet Take 1 tablet by mouth daily. 02/23/19   Michel Bickers, MD  calcitRIOL (ROCALTROL) 0.25 MCG capsule  Take 1 capsule (0.25 mcg total) by mouth daily. 10/17/18   [name], MD  calcium acetate (PHOSLO) 667 MG capsule Take 2 capsules (1,334 mg total) by mouth 3 (three) times daily with meals. 10/16/18   [name], MD  cephALEXin (KEFLEX) 500 MG capsule Take 1 capsule (500 mg total) by mouth 3 (three) times daily. Patient is on Dialysis and will discuss how to take the medication for skin abscess between HD sessions. 05/17/19   Ulyses Amor, PA-C  citalopram (CELEXA) 20 MG tablet Take two 20 mg tablets (40 mg total) each day for depression. Patient taking differently: Take 20 mg by mouth daily.  03/20/19   Marianna Payment, MD  diphenhydrAMINE (BENADRYL) 50 MG tablet Take 0.5 tablets (25 mg total) by mouth every 6 (six) hours as needed for itching (As needed for itching). 05/03/19   Santos-Sanchez, Merlene Morse, MD  gabapentin (NEURONTIN) 300 MG capsule Take 1 capsule (300 mg total) by mouth daily as needed. 05/03/19   [name], MD  glucose blood (ACCU-CHEK GUIDE) test strip USE TO TEST BLOOD SUGAR 3 TIMES DAILY . Diagnosis code  E11.69, E66.9 07/09/17   Lars Mage, MD  hydrALAZINE (APRESOLINE)  25 MG tablet Take 1 tablet (25 mg total) by mouth 3 (three) times daily. 05/12/19 06/11/19  Al Decant, MD  Insulin Glargine (LANTUS) 100 UNIT/ML Solostar Pen Inject 5 Units into the skin at bedtime. 05/12/19   Al Decant, MD  liraglutide (VICTOZA) 18 MG/3ML SOPN Inject 0.2 mLs (1.2 mg total) into the skin daily. 05/12/19   Al Decant, MD  multivitamin (RENA-VIT) TABS tablet Take 1 tablet by mouth daily. 04/27/19   [provider]  oxyCODONE-acetaminophen (PERCOCET/ROXICET) 5-325 MG tablet Take 1 tablet by mouth every 6 (six) hours as needed (pain). 04/14/19   Dagoberto Ligas, PA-C  triamcinolone (KENALOG) 0.025 % ointment Apply 1 application topically 2 (two) times daily. 05/15/19   Al Decant, MD   Current Facility-Administered Medications  Medication Dose Route Frequency  Provider Last Rate Last Dose  . acetaminophen (TYLENOL) tablet 650 mg  650 mg Oral Q6H PRN Welford Roche, MD       Or  . acetaminophen (TYLENOL) suppository 650 mg  650 mg Rectal Q6H PRN Welford Roche, MD      . bictegravir-emtricitabine-tenofovir AF (BIKTARVY) 50-200-25 MG per tablet 1 tablet  1 tablet Oral Daily Santos-Sanchez, Idalys, MD      . calcitRIOL (ROCALTROL) capsule 0.25 mcg  0.25 mcg Oral Daily Santos-Sanchez, Idalys, MD      . calcium acetate (PHOSLO) capsule 1,334 mg  1,334 mg Oral TID WC Santos-Sanchez, Idalys, MD      . erythromycin ophthalmic ointment   Both Eyes Q8H Santos-Sanchez, Idalys, MD      . heparin injection 5,000 Units  5,000 Units Subcutaneous Q8H Santos-Sanchez, Idalys, MD      . insulin aspart (novoLOG) injection 0-15 Units  0-15 Units Subcutaneous TID WC Santos-Sanchez, Idalys, MD      . multivitamin (RENA-VIT) tablet 1 tablet  1 tablet Oral QHS Welford Roche, MD   1 tablet at 06/07/19 0125  . polyvinyl alcohol (LIQUIFILM TEARS) 1.4 % ophthalmic solution 2 drop  2 drop Left Eye PRN Marianna Payment, MD      . sodium chloride flush (NS) 0.9 % injection 3 mL  3 mL Intravenous Q12H Santos-Sanchez, Idalys, MD      . triamcinolone ointment (KENALOG) 0.1 % 1 application  1 application Topical BID Welford Roche, MD       Current Outpatient Medications  Medication Sig Dispense Refill  . albuterol (VENTOLIN HFA) 108 (90 Base) MCG/ACT inhaler Inhale 2 puffs into the lungs every 4 (four) hours as needed for wheezing or shortness of breath.    Marland Kitchen amLODipine (NORVASC) 10 MG tablet Take 1 tablet (10 mg total) by mouth daily. 90 tablet 0  . bictegravir-emtricitabine-tenofovir AF (BIKTARVY) 50-200-25 MG TABS tablet Take 1 tablet by mouth daily. 30 tablet 11  . calcitRIOL (ROCALTROL) 0.25 MCG capsule Take 1 capsule (0.25 mcg total) by mouth daily. 30 capsule 1  . calcium acetate (PHOSLO) 667 MG capsule Take 2 capsules (1,334 mg total) by mouth 3  (three) times daily with meals. 180 capsule 2  . cephALEXin (KEFLEX) 500 MG capsule Take 1 capsule (500 mg total) by mouth 3 (three) times daily. Patient is on Dialysis and will discuss how to take the medication for skin abscess between HD sessions. 21 capsule 0  . citalopram (CELEXA) 20 MG tablet Take two 20 mg tablets (40 mg total) each day for depression. (Patient taking differently: Take 20 mg by mouth daily. ) 60 tablet 0  . diphenhydrAMINE (BENADRYL) 50 MG tablet Take 0.5 tablets (25  mg total) by mouth every 6 (six) hours as needed for itching (As needed for itching). 30 tablet 0  . gabapentin (NEURONTIN) 300 MG capsule Take 1 capsule (300 mg total) by mouth daily as needed. 90 capsule 0  . glucose blood (ACCU-CHEK GUIDE) test strip USE TO TEST BLOOD SUGAR 3 TIMES DAILY . Diagnosis code  E11.69, E66.9 100 each 11  . hydrALAZINE (APRESOLINE) 25 MG tablet Take 1 tablet (25 mg total) by mouth 3 (three) times daily. 90 tablet 2  . Insulin Glargine (LANTUS) 100 UNIT/ML Solostar Pen Inject 5 Units into the skin at bedtime. 3 mL 0  . liraglutide (VICTOZA) 18 MG/3ML SOPN Inject 0.2 mLs (1.2 mg total) into the skin daily. 3 mL 1  . multivitamin (RENA-VIT) TABS tablet Take 1 tablet by mouth daily.    Marland Kitchen oxyCODONE-acetaminophen (PERCOCET/ROXICET) 5-325 MG tablet Take 1 tablet by mouth every 6 (six) hours as needed (pain). 20 tablet 0  . triamcinolone (KENALOG) 0.025 % ointment Apply 1 application topically 2 (two) times daily. 30 g 0   Labs: Basic Metabolic Panel: Recent Labs  Lab 06/06/19 1649 06/07/19 0253  NA 136 134*  K 3.9 4.7  CL 95* 93*  CO2 21* 20*  GLUCOSE 69* 101*  BUN 36* 41*  CREATININE 5.73* 6.72*  CALCIUM 8.4* 7.9*  PHOS  --  6.8*   Liver Function Tests: Recent Labs  Lab 06/06/19 1649 06/07/19 0253  AST 30  --   ALT 20  --   ALKPHOS 140*  --   BILITOT 0.8  --   PROT 9.2*  --   ALBUMIN 3.6 3.4*   CBC: Recent Labs  Lab 06/06/19 1649 06/07/19 0253  WBC 7.3 5.4   NEUTROABS 5.2  --   HGB 11.8* 12.6  HCT 37.3 39.4  MCV 92.6 93.6  PLT 292 263   CBG: Recent Labs  Lab 06/06/19 1931 06/06/19 2043  GLUCAP 65* 152*   Studies/Results: Ct Head Wo Contrast  Result Date: 06/06/2019 CLINICAL DATA:  Altered level of consciousness and disorientation. Chronic renal failure. EXAM: CT HEAD WITHOUT CONTRAST TECHNIQUE: Contiguous axial images were obtained from the base of the skull through the vertex without intravenous contrast. COMPARISON:  None. FINDINGS: Brain: The ventricles are normal in size and configuration. There is a cavum septum pellucidum, an anatomic variant. There is no intracranial mass, hemorrhage, extra-axial fluid collection, or midline shift. There is a focal area of decreased attenuation in the anterior right centrum semiovale at the mid ventricular level which may represent a recent and possibly acute white matter infarct. This focus is located just superior to the right basal ganglia anteriorly. Elsewhere brain parenchyma appears unremarkable. Vascular: There is no hyperdense vessel. There is calcification in each carotid siphon region. Skull: The bony calvarium appears intact. Sinuses/Orbits: There is opacification in several ethmoid air cells bilaterally and in the left sphenoid sinus. Orbits appear symmetric bilaterally. Other: Mastoid air cells are clear. IMPRESSION: Suspect recent and potentially acute infarct in the right anterior centrum semiovale, just superior to the right basal ganglia. Elsewhere brain parenchyma appears unremarkable. No mass or hemorrhage. There are foci of paranasal sinus disease at several sites. There are foci of arterial vascular calcification. Electronically Signed   By: Lowella Grip III M.D.   On: 06/06/2019 21:10   Mr Brain Wo Contrast  Result Date: 06/07/2019 CLINICAL DATA:  Encephalopathy EXAM: MRI HEAD WITHOUT CONTRAST TECHNIQUE: Multiplanar, multiecho pulse sequences of the brain and surrounding  structures were obtained  without intravenous contrast. COMPARISON:  Head CT 06/06/2019 FINDINGS: Examination was discontinued prior to completion due to patient discomfort. 11 series are provided for interpretation. Axial T1-weighted imaging and coronal T2-weighted imaging were not obtained. BRAIN: There is no acute infarct, acute hemorrhage or extra-axial collection. 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 The cerebral and cerebellar volume are age-appropriate. There is no hydrocephalus. Incidentally noted cavum septum pellucidum et vergae. VASCULAR: The major intracranial arterial and venous sinus flow voids are normal. Susceptibility-sensitive sequences show no chronic microhemorrhage or superficial siderosis. SKULL AND UPPER CERVICAL SPINE: Calvarial bone marrow signal is normal. There is no skull base mass. The visualized upper cervical spine and soft tissues are normal. SINUSES/ORBITS: There are no fluid levels or advanced mucosal thickening. The mastoid air cells and middle ear cavities are free of fluid. The orbits are normal. IMPRESSION: 1. Truncated examination due to patient discomfort. 2. No acute intracranial process. 3. 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 Electronically Signed   By: Ulyses Jarred M.D.   On: 06/07/2019 00:45   Dg Chest Portable 1 View  Result Date: 06/06/2019 CLINICAL DATA:  Chest pain and shortness of breath EXAM: PORTABLE CHEST 1 VIEW COMPARISON:  May 02, 2019 FINDINGS: The mediastinal contour is normal. The heart size is mildly enlarged. Right central venous line is identified unchanged. There is no focal infiltrate, pulmonary edema, or pleural effusion. The bony structures are normal. IMPRESSION: No acute cardiopulmonary disease identified.  Mild cardiomegaly. Electronically Signed   By: Abelardo Diesel M.D.   On: 06/06/2019 17:25    ROS: All others negative except those listed in HPI.  Physical Exam: Vitals:   06/07/19 0445 06/07/19 0530 06/07/19 0600 06/07/19  0630  BP:      Pulse: 89     Resp: 11 19 16 18   Temp:      SpO2: 96%     Weight:         General: WDWN, NAD, well appearing female, laying in bed Head: NCAT sclera not icteric MMM Neck: Supple. No lymphadenopathy Lungs: CTA bilaterally. No wheeze, rales or rhonchi. Breathing is unlabored. Heart: RRR. No murmur, rubs or gallops.  Abdomen: soft, nontender, +BS, no guarding, no rebound tenderness Lower extremities: L AKA, no edema b/l, perforating dermatosis on RLE Neuro: AAOx3. Moves all extremities spontaneously. Psych:  Responds to questions appropriately with a normal affect. Dialysis Access: R TDC, LU AVF maturing +b  Dialysis Orders:  TTS - [facility]  4hrs 53min, BFR 400, DFR 800,  EDW 67kg, 2K/ 2.5Ca  Access: LU AVF maturing, TDC  Heparin 3000 Calcitriol 0.5 mcg PO qHD  Assessment/Plan: 1.  AMS - resolved.  MRI showed no acute process, old infarct.  BC ordered. Per primary 2.  ESRD -  On HD TTS. K 4.7. Next HD tomorrow. Discussed importance of fluid restrictions and optimal gains between treatments to help avoid cramping.  3.  Hypertension/volume  - BP variable, improved this AM. 4.  Anemia of CKD - Hgb 12.6. No indication for ESA at this time. 5.  Secondary Hyperparathyroidism -  CA low, use ^Ca bath, continue VDRA. Phos elevated, continue phoslo 2AC. 6.  Nutrition - Renal diet w/fluid restrictions.  7. Dispo - ok to d/c from renal standpoint  Jen Mow, PA-C [facility] Pager: [phone] 06/07/2019, 10:01 AM   Pt seen, examined and agree w A/P as above.  Kelly Splinter  MD 06/07/2019, 2:40 PM

## 2019-06-07 NOTE — ED Notes (Signed)
Pt given turkey sandwich and sprite zero 

## 2019-06-07 NOTE — Discharge Summary (Signed)
Name: Kathy Frank MRN: [id number] DOB: July 14, 1976 43 y.o. PCP: Lars Mage, MD  Date of Admission: 06/06/2019  4:12 PM Date of Discharge:  Attending Physician: Velna Ochs, MD  Discharge Diagnosis: 1. Corneal Abrasion  Discharge Medications: Allergies as of 06/07/2019   No Known Allergies     Medication List    TAKE these medications   albuterol 108 (90 Base) MCG/ACT inhaler Commonly known as: VENTOLIN HFA Inhale 2 puffs into the lungs every 4 (four) hours as needed for wheezing or shortness of breath.   amLODipine 10 MG tablet Commonly known as: NORVASC Take 1 tablet (10 mg total) by mouth daily.   bictegravir-emtricitabine-tenofovir AF 50-200-25 MG Tabs tablet Commonly known as: BIKTARVY Take 1 tablet by mouth daily.   calcitRIOL 0.25 MCG capsule Commonly known as: ROCALTROL Take 1 capsule (0.25 mcg total) by mouth daily.   calcium acetate 667 MG capsule Commonly known as: PHOSLO Take 2 capsules (1,334 mg total) by mouth 3 (three) times daily with meals.   cephALEXin 500 MG capsule Commonly known as: KEFLEX Take 1 capsule (500 mg total) by mouth 3 (three) times daily. Patient is on Dialysis and will discuss how to take the medication for skin abscess between HD sessions.   citalopram 20 MG tablet Commonly known as: CeleXA Take two 20 mg tablets (40 mg total) each day for depression. What changed:   how much to take  how to take this  when to take this  additional instructions   diphenhydrAMINE 50 MG tablet Commonly known as: BENADRYL Take 0.5 tablets (25 mg total) by mouth every 6 (six) hours as needed for itching (As needed for itching).   erythromycin ophthalmic ointment Place into both eyes every 8 (eight) hours for 4 days.   gabapentin 300 MG capsule Commonly known as: NEURONTIN Take 1 capsule (300 mg total) by mouth daily as needed.   glucose blood test strip Commonly known as: Accu-Chek Guide USE TO TEST BLOOD SUGAR 3 TIMES  DAILY . Diagnosis code  E11.69, E66.9   hydrALAZINE 25 MG tablet Commonly known as: APRESOLINE Take 1 tablet (25 mg total) by mouth 3 (three) times daily.   Insulin Glargine 100 UNIT/ML Solostar Pen Commonly known as: LANTUS Inject 5 Units into the skin at bedtime.   multivitamin Tabs tablet Take 1 tablet by mouth daily.   oxyCODONE-acetaminophen 5-325 MG tablet Commonly known as: PERCOCET/ROXICET Take 1 tablet by mouth every 6 (six) hours as needed (pain).   polyvinyl alcohol 1.4 % ophthalmic solution Commonly known as: LIQUIFILM TEARS Place 2 drops into the left eye as needed for dry eyes.   triamcinolone 0.025 % ointment Commonly known as: KENALOG Apply 1 application topically 2 (two) times daily.   Victoza 18 MG/3ML Sopn Generic drug: liraglutide Inject 0.2 mLs (1.2 mg total) into the skin daily.       Disposition and follow-up:   Ms.Kenzey C Kievit was discharged from [facility] in Good condition.  At the hospital follow up visit please address:  1.  Follow up:  (1) Corneal Abrasion - make sure patient completes her erythromycin treatment. Make sure the patient follows up with the eye doctor on Monday 11/02 at 9:30am  (2) ESRD - make sure patient follows up with her outpatient dialysis clinic   2.  Labs / imaging needed at time of follow-up: BMP  3.  Pending labs/ test needing follow-up: none  Follow-up Appointments:   (1) [facility] on November 2  at 930 (2) [facility] [facility] -clinical call to set up an appointment.  Hospital Course by problem list: 1. Altered Mental Status -patient presented to [facility] on 10[facility] with signs and symptoms of altered mental status concerning for acute encephalopathy secondary to uremia.  Patient presented to her dialysis clinic with increased confusion.  They cut short her dialysis and brought her to the ED.  Patient states that she too wask multiple packets of [employer] powders  before presenting at the clinic. 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  Overnight the patient's altered mental status resolved.  Patient salicylate and EtOH levels were normal.  Her altered mental status is likely secondary to ingestion of multiple packets of [employer] powder.   2. Corneal Abrasion -presented with pain in her left eye that is worse with movement.  She endorsed decreased vision in both eyes but this was determined to be chronic due to diabetic retinopathy.  Patient's left eye was evaluated in the ED via slit lamp and fluorescein examination which showed corneal abrasion.  Patient was started on erythromycin ointment every 6 hours for 5 days with slight improvement of her pain after application.  Patient was set up for a follow-up appointment with [facility] for November 2 at 5.  3. ESRD -nephrology stated that the patient did not qualify for emergent dialysis.  She is stable for discharge to follow-up at her outpatient dialysis clinic.   Discharge Vitals:   BP 106/87 (BP Location: Right Arm)   Pulse 89   Temp 97.9 F (36.6 C)   Resp 18   Wt 69.9 kg   SpO2 96%   BMI 25.64 kg/m   Pertinent Labs, Studies, and Procedures:  CBC Latest Ref Rng & Units 06/07/2019 06/06/2019 05/03/2019  WBC 4.0 - 10.5 K/uL 5.4 7.3 -  Hemoglobin 12.0 - 15.0 g/dL 12.6 11.8(L) 8.6(L)  Hematocrit 36.0 - 46.0 % 39.4 37.3 25.9(L)  Platelets 150 - 400 K/uL 263 292 -   CMP Latest Ref Rng & Units 06/07/2019 06/06/2019 05/03/2019  Glucose 70 - 99 mg/dL 101(H) 69(L) 108(H)  BUN 6 - 20 mg/dL 41(H) 36(H) 38(H)  Creatinine 0.44 - 1.00 mg/dL 6.72(H) 5.73(H) 6.70(H)  Sodium 135 - 145 mmol/L 134(L) 136 138  Potassium 3.5 - 5.1 mmol/L 4.7 3.9 4.0  Chloride 98 - 111 mmol/L 93(L) 95(L) 105  CO2 22 - 32 mmol/L 20(L) 21(L) 20(L)  Calcium 8.9 - 10.3 mg/dL 7.9(L) 8.4(L) 6.9(L)  Total Protein 6.5 - 8.1 g/dL - 9.2(H) 6.5  Total Bilirubin 0.3 - 1.2 mg/dL - 0.8 0.4  Alkaline Phos 38 - 126 U/L -  140(H) 107  AST 15 - 41 U/L - 30 13(L)  ALT 0 - 44 U/L - 20 9   CXR: Suspect recent and potentially acute infarct in the right anterior centrum semiovale, just superior to the right basal ganglia. Elsewhere brain parenchyma appears unremarkable. No mass or hemorrhage.  There are foci of paranasal sinus disease at several sites. There are foci of arterial vascular calcification.  CT head wo contrast: Suspect recent and potentially acute infarct in the right anterior centrum semiovale, just superior to the right basal ganglia. Elsewhere brain parenchyma appears unremarkable. No mass or hemorrhage.  There are foci of paranasal sinus disease at several sites. There are foci of arterial vascular calcification.  MR brain wo contrast:  1. Truncated examination due to patient discomfort. 2. No acute intracranial process. 3. 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  Discharge Instructions:  Discharge Instructions    Diet - low sodium heart healthy   Complete by: As directed    Discharge instructions   Complete by: As directed    You were hospitalized for Corneal Abrasion. Thank you for allowing [place] to be part of your care.   We arranged for you to follow up at:  (1) [facility] with Dr. Gari Crown on Monday (11/02) at 9:30am  (2) [facility] - the clinic will call you to make an appointment.  Please note these changes made to your medications:   Please START taking:  (1) Erythromycin eye ointment apply every 6 hours for 4 days (2) Liquifilm Tears (polyvinl alcohol) apply twice daily as needed (3) Resume home medications  Please STOP taking:    Please make sure to follow up with the Eye doctor and  internal medicine clinic.  Please call our clinic if you have any questions or concerns, we may be able to help and keep you from a long and expensive emergency room wait. Our clinic and after hours phone number is [phone], the best  time to call is Monday through Friday 9 am to 4 pm but there is always someone available 24/7 if you have an emergency. If you need medication refills please notify your pharmacy one week in advance and they will send [place] a request.   Increase activity slowly   Complete by: As directed       Signed: Marianna Payment, MD 06/07/2019, 12:52 PM   Pager: [phone]

## 2019-06-08 ENCOUNTER — Ambulatory Visit: Payer: Medicaid Other

## 2019-06-11 LAB — CULTURE, BLOOD (ROUTINE X 2)
Culture: NO GROWTH
Culture: NO GROWTH
Special Requests: ADEQUATE
Special Requests: ADEQUATE

## 2019-06-12 ENCOUNTER — Encounter: Payer: Self-pay | Admitting: Physical Therapy

## 2019-06-12 ENCOUNTER — Other Ambulatory Visit: Payer: Self-pay

## 2019-06-12 ENCOUNTER — Ambulatory Visit: Payer: Medicaid Other | Attending: Internal Medicine | Admitting: Physical Therapy

## 2019-06-12 DIAGNOSIS — R2689 Other abnormalities of gait and mobility: Secondary | ICD-10-CM | POA: Diagnosis present

## 2019-06-12 DIAGNOSIS — M6281 Muscle weakness (generalized): Secondary | ICD-10-CM | POA: Insufficient documentation

## 2019-06-12 DIAGNOSIS — M25652 Stiffness of left hip, not elsewhere classified: Secondary | ICD-10-CM | POA: Diagnosis present

## 2019-06-12 DIAGNOSIS — R293 Abnormal posture: Secondary | ICD-10-CM | POA: Diagnosis present

## 2019-06-12 DIAGNOSIS — R2681 Unsteadiness on feet: Secondary | ICD-10-CM | POA: Insufficient documentation

## 2019-06-12 NOTE — Therapy (Signed)
[facility] [address] [place], [place], [address] Phone: [phone]   Fax:  [phone]  Physical Therapy Evaluation  Patient Details  Name: Kathy Frank MRN: [id number] Date of Birth: 09-28-1975 Referring Provider (PT): Joni Reining, DO   Encounter Date: 06/12/2019  PT End of Session - 06/12/19 2121    Visit Number  1    Number of Visits  28    Authorization Type  Medicaid    PT Start Time  1024    PT Stop Time  1105    PT Time Calculation (min)  41 min    Equipment Utilized During Treatment  Gait belt    Activity Tolerance  Patient tolerated treatment well    Behavior During Therapy  [facility] for tasks assessed/performed       Past Medical History:  Diagnosis Date  . Below-knee amputation of left lower extremity ([place])   . CKD (chronic kidney disease) stage 5, GFR less than 15 ml/min (HCC) 06/19/2016   06/30/16: Seen by Dr. Posey Pronto [Nephrology]. Suspect etiologies to include diabetes and hypertension though cannot exclude HIV-associated nephropathy. Advised her against from using excessive Goody powder.  . Contact dermatitis 04/21/2016   07/29/16: Skin biopsy performed by [facility] notable for acute spongiotic dermatitis consistent with contact dermatitis.  . Cyclical vomiting    THC induced???  . Depression 06/28/2006   Qualifier: Diagnosis of  By: Riccardo Dubin MD, [place]    . Diabetes mellitus type 2 in obese ([place]) 06/28/1994  . DKA (diabetic ketoacidoses) ([place]) 12/2014  . Dyspnea   . Erosive esophagitis   . Esophageal reflux   . Essential hypertension 11/16/2013  . Gastroparesis    ? diabetic  . GERD (gastroesophageal reflux disease)   . History of blood transfusion   . Human immunodeficiency virus (HIV) disease ([place]) 04/23/2016  . Hypertension   . Moderate nonproliferative diabetic retinopathy of both eyes ([place]) 11/21/2014   11/14/14: Noted on retinal imaging; needs follow-up imaging in 6 months   05/22/16: Noted on retinal imaging again; needs follow-up imaging in 6 months  . Normocytic anemia   . TOBACCO USER 06/28/2006   Qualifier: Diagnosis of  By: Riccardo Dubin MD, Sherren Mocha    . Type II diabetes mellitus ([place]) 1994   diagnosed around 33  . Wound dehiscence   . Wound infection s/p L transmetatarsal amputation     Past Surgical History:  Procedure Laterality Date  . AMPUTATION Left 07/08/2018   Procedure: AMPUTATION FORTH RAY LEFT FOOT;  Surgeon: [name], MD;  Location: [place];  Service: Orthopedics;  Laterality: Left;  . AMPUTATION Left 08/09/2018   Procedure: Left Transmetatarsal Amputation;  Surgeon: [name], MD;  Location: [facility];  Service: Orthopedics;  Laterality: Left;  . AMPUTATION Left 10/08/2018   Procedure: LEFT BELOW KNEE AMPUTATION;  Surgeon: [name], MD;  Location: [facility];  Service: Orthopedics;  Laterality: Left;  . AMPUTATION Left 10/28/2018   Procedure: REVISION BELOW KNEE AMPUTATION;  Surgeon: [name], MD;  Location: [place] [facility];  Service: Orthopedics;  Laterality: Left;  . AMPUTATION Left 12/16/2018   Procedure: LEFT ABOVE KNEE AMPUTATION;  Surgeon: [name], MD;  Location: [facility];  Service: Orthopedics;  Laterality: Left;  . AV FISTULA PLACEMENT Left 10/14/2018   Procedure: Arteriovenous (Av) Fistula Creation Left Arm;  Surgeon: [name], MD;  Location: [facility];  Service: Vascular;  Laterality: Left;  . BASCILIC VEIN TRANSPOSITION Left 04/14/2019  Procedure: BASILIC VEIN TRANSPOSITION SECOND STAGE LEFT ARM;  Surgeon: Rosetta Posner, MD;  Location: [facility];  Service: Vascular;  Laterality: Left;  . INSERTION OF DIALYSIS CATHETER Right 04/14/2019   Procedure: INSERTION OF DIALYSIS CATHETER;  Surgeon: Rosetta Posner, MD;  Location: [facility];  Service: Vascular;  Laterality: Right;  . LOWER EXTREMITY ANGIOGRAPHY N/A 07/05/2018   Procedure: LOWER EXTREMITY ANGIOGRAPHY;  Surgeon: Serafina Mitchell, MD;  Location: [facility];  Service:  Cardiovascular;  Laterality: N/A;  . PERIPHERAL VASCULAR BALLOON ANGIOPLASTY Left 07/05/2018   Procedure: PERIPHERAL VASCULAR BALLOON ANGIOPLASTY;  Surgeon: Serafina Mitchell, MD;  Location: [facility];  Service: Cardiovascular;  Laterality: Left;  SFA  . STUMP REVISION Left 10/19/2018   Procedure: REVISION LEFT BELOW KNEE AMPUTATION;  Surgeon: [name], MD;  Location: [facility];  Service: Orthopedics;  Laterality: Left;  . TUBAL LIGATION  2002    There were no vitals filed for this visit.   Subjective Assessment - 06/12/19 1027    Subjective  This 43yo female was referred to PT by Joni Reining, DO on 05/30/2019. She underwent a left Transfemoral Amputation on 12/16/2018 due to non-healing Transtibial Amputaion on 10/08/2018 with revision 10/19/2018 & 10/28/2018.  She recieved prostheis on 04/18/2019.    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  No/denies         [facility] PT Assessment - 06/12/19 1025      Assessment   Medical Diagnosis  Left Transfemoral Amputation    Referring Provider (PT)  Joni Reining, DO    Onset Date/Surgical Date  04/18/19   prosthesis delivery   Hand Dominance  Right    Prior Therapy  HHPT for 3-4 visits      Precautions   Precautions  Fall    Precaution Comments  NO BP in LUE      Balance Screen   Has the patient fallen in the past 6 months  No    Has the patient had a decrease in activity level because of a fear of falling?   Yes    Is the patient reluctant to leave their home because of a fear of falling?   Yes      [place] residence    [place]   43yo & 20yo daughters   Type of [place] to enter    Entrance Stairs-Number of Steps  3     Entrance Stairs-Rails  Right;Left;Cannot reach both    [place]  One level    Home Equipment  Wheelchair - [employer] - 2 wheels;Bedside commode;Tub bench      Prior Function   Level of Independence  Independent;Independent with household mobility without device;Independent with community mobility without device   No limitations prior to Nov with toe amputation   Vocation  Unemployed   trying to get disability   Leisure  dance,       Posture/Postural Control   Posture/Postural Control  Postural limitations    Postural Limitations  Rounded Shoulders;Forward head;Increased lumbar lordosis;Flexed trunk;Weight shift right      ROM / Strength   AROM /  PROM / Strength  AROM;Strength      AROM   Overall AROM   Deficits    Overall AROM Comments  left hip extension standing -20*       Strength   Overall Strength  Deficits    Overall Strength Comments  left hip grossly 4/5 tested in sitting      Transfers   Transfers  Sit to Stand;Stand to Sit    Sit to Stand  5: Supervision;With upper extremity assist;With armrests;From chair/3-in-1   to RW for support, TFA prosthetic knee locked   Sit to Stand Details  Visual cues for safe use of DME/AE;Verbal cues for safe use of DME/AE;Verbal cues for technique    Stand to Sit  5: Supervision;With upper extremity assist;With armrests;To chair/3-in-1   from RW with TFA prosthetic knee locked   Stand to Sit Details (indicate cue type and reason)  Visual cues for safe use of DME/AE;Verbal cues for safe use of DME/AE;Verbal cues for sequencing      Ambulation/Gait   Ambulation/Gait  Yes    Ambulation/Gait Assistance  4: Min assist    Ambulation/Gait Assistance Details  excessive UE weight bearing on RW, locked prosthetic knee    Ambulation Distance (Feet)  50 Feet    Assistive device  Rolling walker;Prosthesis    Gait Pattern  Step-to pattern;Decreased step length - right;Decreased stance time - left;Decreased hip/knee flexion - left;Decreased  weight shift to left;Left circumduction;Left hip hike;Antalgic;Lateral hip instability;Trunk flexed   adducted prosthetic step with hip instability   Ambulation Surface  Indoor;Level    Gait velocity  0.41 ft/sec      Standardized Balance Assessment   Standardized Balance Assessment  Berg Balance Test      Berg Balance Test   Sit to Stand  Able to stand using hands after several tries    Standing Unsupported  Needs several tries to stand 30 seconds unsupported    Sitting with Back Unsupported but Feet Supported on Floor or Stool  Able to sit safely and securely 2 minutes    Stand to Sit  Controls descent by using hands    Transfers  Able to transfer safely, definite need of hands    Standing Unsupported with Eyes Closed  Needs help to keep from falling    Standing Unsupported with Feet Together  Needs help to attain position but able to stand for 30 seconds with feet together    From Standing, Reach Forward with Outstretched Arm  Loses balance while trying/requires external support   MinA reaching 2"   From Standing Position, Pick up Object from Floor  Unable to try/needs assist to keep balance   reaches to ankle level with RW support with min guard   From Standing Position, Turn to Look Behind Over each Shoulder  Needs assist to keep from losing balance and falling   minA to look to side without UE support   Turn 360 Degrees  Needs assistance while turning    Standing Unsupported, Alternately Place Feet on Step/Stool  Needs assistance to keep from falling or unable to try    Standing Unsupported, One Foot in [employer] balance while stepping or standing    Standing on One Leg  Unable to try or needs assist to prevent fall    Total Score  14      Prosthetics Assessment - 06/12/19 [place] with  Residual limb care;Skin check;Care of non-amputated  limb;Prosthetic cleaning;Ply sock cleaning;Correct ply sock adjustment;Proper wear  schedule/adjustment;Proper weight-bearing schedule/adjustment    Donning prosthesis   Min assist   100% verbal cues   Doffing prosthesis   Supervision   verbal cues   Current prosthetic wear tolerance (days/week)   ~50% of days since delivery 56 days prior to PT eval    Current prosthetic wear tolerance (#hours/day)   20-30 minutes    Current prosthetic weight-bearing tolerance (hours/day)   Patient tolerated 5 minutes of standing without limb pain.     Edema  pitting edema.     Residual limb condition   no open areas, dry skin, normal color & temperature,     K code/activity level with prosthetic use   K2 limited community.  Multiaxial knee with manual lock option, single axis foot, silicon liner with velcro lanyard suspension.                Objective measurements completed on examination: See above findings.      [facility] Adult PT Treatment/Exercise - 06/12/19 1025      Prosthetics   Prosthetic Care Comments   initiate wear 2hrs 2x/day on non-dialysis days & 1x on dialysis days.     Education Provided  Skin check;Residual limb care;Prosthetic cleaning;Correct ply sock adjustment;Proper Donning;Proper Doffing;Proper wear schedule/adjustment    Person(s) Educated  Patient;Child(ren)    Education Method  Explanation;Demonstration;Tactile cues;Verbal cues    Education Method  Verbalized understanding;Returned demonstration;Tactile cues required;Verbal cues required;Needs further instruction               PT Short Term Goals - 06/12/19 2146      PT SHORT TERM GOAL #1   Title  Patient demonstrates proper prosthesis donning including tightening suspension strap in standing with RW support.  (All STGs Target Date is 3rd visit after eval)    Baseline  Patient requires minimal assist and 100% verbal cues for proper donning of prosthesis.    Time  3    Period  Weeks    Status  New      PT SHORT TERM GOAL #2   Title  Patient reports prosthesis wear >/= 5 hours total on  non-dialysis days and >/= 2hrs on dialysis days. (All STGs Target Date is 3rd visit after eval)    Baseline  Patient reports wear ~50% of days for 20-30 minutes    Time  3    Period  Weeks    Status  New      PT SHORT TERM GOAL #3   Title  In standing, patient able to manage clothes, reach 10" & pick up object from floor with RW support with supervision. (All STGs Target Date is 3rd visit after eval)    Baseline  Standing balance with RW support with locked prosthetic knee, minimal assist to manage pants, reaches 5" and to ankle level with minA with RW support.    Time  3    Period  Weeks    Status  New      PT SHORT TERM GOAL #4   Title  Patient ambulates 100' with RW & locked prosthetic knee with supervision. (All STGs Target Date is 3rd visit after eval)    Baseline  Patient ambulates 39' with RW & locked prosthetic knee with minimal assist. Significant gait deviations & gait velocity of 0.41 ft/sec indicating high fall risk.    Time  3    Period  Weeks    Status  New  PT SHORT TERM GOAL #5   Title  Patient negotiates stairs with 2 rails, ramps & curbs with RW & prosthesis locked knee with moderate assist.  (All STGs Target Date: 3rd visit after evaluation)    Baseline  Patient is non-ambulatory on stairs, ramps & curbs with prosthesis.    Time  3    Period  Weeks    Status  New        PT Long Term Goals - 06/12/19 2136      PT LONG TERM GOAL #1   Title  Patient verbalizes & demonstrates proper prosthetic care to enable safe utilization of prosthesis (All LTGs Target Date: 27th visit after evaluation)    Baseline  Patient is dependent in all aspects of prosthetic care increasing risk of skin issues & falls.    Time  15    Period  Weeks    Status  New      PT LONG TERM GOAL #2   Title  Patient tolerates prosthesis wear >90% of awake hours to enable function throughout her day.  (All LTGs Target Date: 27th visit after evaluation)    Baseline  Patient reports wear ~50% of  days for 20-30 minutes only since prosthesis delivery    Time  15    Period  Weeks    Status  New      PT LONG TERM GOAL #3   Title  Berg Balance >36/56 to indicate lower fall risk.   (All LTGs Target Date: 27th visit after evaluation)    Baseline  Berg Balance 14/56    Time  15    Period  Weeks    Status  New      PT LONG TERM GOAL #4   Title  Patient ambulates 400' with LRAD & prosthesis modified independent to enable community mobility.   (All LTGs Target Date: 27th visit after evaluation)    Baseline  Patient ambulates 47' with RW & locked prosthetic knee with minimal assist. Significant gait deviations & gait velocity of 0.41 ft/sec indicating high fall risk.    Time  15    Period  Weeks    Status  New      PT LONG TERM GOAL #5   Title  Patient negotiates stairs with 1 rail, ramps & curbs with LRAD & prosthesis modified independent to enable community access.  (All LTGs Target Date: 27th visit after evaluation)    Baseline  Patient is non-ambulatory on stairs, ramps & curbs with prosthesis.    Time  15    Period  Weeks    Status  New             Plan - 06/12/19 2124    Clinical Impression Statement  This 43yo female underwent a toe amputation in Nov 2019, Transtibial Amputation 09/18/2018 and revised to a transfemoral Amputation on 12/16/2018. She also has recently started dialysis. She has deconditioning with decreased range of motion, strength & endurance limiting function.  She is dependent in prosthetic care limiting safe use of prosthesis.  She reports wear ~50% of days but only 20-30 minute which limits function & increases fall risk.  Berg Balance 14/56 indicates high fall risk and dependency in standing ADLs.  Her gait is dependent with gait deviations including excessive UE weight bearing with RW and prosthetic knee is locked. She ambulated 22' with RW & locked prosthetic knee with minimal assist.  Patient would benefit from skilled PT to improve function & safety.  Personal Factors and Comorbidities  Comorbidity 3+;Fitness;Time since onset of injury/illness/exacerbation    Comorbidities  Left TFA, CKD w/ ESRD, DM2, retinopathy, obesity, HTN, HIV    Examination-Activity Limitations  Locomotion Level;Reach Overhead;Squat;Stairs;Stand;Transfers    Examination-Participation Restrictions  Community Activity;Driving    Stability/Clinical Decision Making  Evolving/Moderate complexity    Clinical Decision Making  Moderate    Rehab Potential  Good    PT Frequency  2x / week   1x/wk for 3 weeks, 2x/wk for 12 weeks   PT Duration  Other (comment)   15 weeks after evaluation   PT Treatment/Interventions  ADLs/Self Care Home Management;DME Instruction;Gait training;Stair training;Functional mobility training;Therapeutic activities;Therapeutic exercise;Balance training;Neuromuscular re-education;Patient/family education;Prosthetic Training;Manual techniques;Vestibular    PT Next Visit Plan  review prosthetic care including donning,  HEP at sink, prosthetic gait with locked knee with RW    Consulted and Agree with Plan of Care  Patient       Patient will benefit from skilled therapeutic intervention in order to improve the following deficits and impairments:  Abnormal gait, Cardiopulmonary status limiting activity, Decreased activity tolerance, Decreased balance, Decreased endurance, Decreased knowledge of use of DME, Decreased mobility, Decreased range of motion, Decreased strength, Impaired flexibility, Postural dysfunction, Prosthetic Dependency  Visit Diagnosis: Other abnormalities of gait and mobility  Unsteadiness on feet  Muscle weakness (generalized)  Stiffness of left hip, not elsewhere classified  Abnormal posture     Problem List Patient Active Problem List   Diagnosis Date Noted  . End stage renal disease on dialysis ([place])   . Left corneal abrasion   . Altered mental status 06/06/2019  . Eye redness 06/06/2019  . Dehiscence of incision  05/12/2019  . Rash and other nonspecific skin eruption 05/12/2019  . ESRD on hemodialysis ([place]) 05/02/2019  . Metabolic bone disease [id number]  . Abdominal pain 02/06/2019  . Hx of BKA, left ([place]) 12/16/2018  . Anemia due to chronic kidney disease   . Renal failure (ARF), acute on chronic ([place]) 11/24/2018  . Fall due to stumbling   . Osteomyelitis ([place]) 10/07/2018  . Dehiscence of amputation stump ([place])   . Moderate protein-calorie malnutrition ([place])   . Adjustment disorder with mixed anxiety and depressed mood 08/15/2018  . CKD (chronic kidney disease), stage V ([place])   . Acute on chronic anemia 07/03/2018  . Fecal incontinence 02/04/2018  . Healthcare maintenance 09/17/2017  . Right leg swelling 02/26/2017  . Superficial ulcerative lesion ([place]) 02/02/2017  . Aortic atherosclerosis ([place]) 09/30/2016  . Diabetic foot ulcer associated with diabetes mellitus due to underlying condition ([place]) 07/08/2016  . Human immunodeficiency virus (HIV) disease ([place]) 04/23/2016  . Gastroparesis   . Moderate nonproliferative diabetic retinopathy of both eyes ([place]) 11/21/2014  . Esophageal reflux   . Microscopic hematuria 10/31/2014  . Hypertension 11/16/2013  . TOBACCO USER 06/28/2006  . Depression 06/28/2006  . Diabetes mellitus type 2 in obese [facility]) 06/28/1994    Jamey Reas PT, DPT 06/12/2019, 9:56 PM  [facility] [address] [place], [place], [address] Phone: [phone]   Fax:  [phone]  Name: BRYAN MOLDENHAUER MRN: [id number] Date of Birth: [birth year]

## 2019-06-16 ENCOUNTER — Ambulatory Visit: Payer: Medicaid Other

## 2019-06-16 MED FILL — MOXIFLOXACIN HCL 0.5 % SOLN: 0.5 | 15 days supply | Qty: 3 | Fill #0

## 2019-06-19 ENCOUNTER — Ambulatory Visit: Payer: Medicaid Other | Admitting: Physical Therapy

## 2019-06-21 ENCOUNTER — Encounter (INDEPENDENT_AMBULATORY_CARE_PROVIDER_SITE_OTHER): Payer: Medicaid Other | Admitting: Ophthalmology

## 2019-06-22 MED FILL — BIKTARVY 50-200-25 MG TABS: 50-200-25 | 30 days supply | Qty: 30 | Fill #4

## 2019-06-26 ENCOUNTER — Ambulatory Visit: Payer: Medicaid Other | Admitting: Physical Therapy

## 2019-06-28 ENCOUNTER — Other Ambulatory Visit: Payer: Self-pay

## 2019-06-28 ENCOUNTER — Ambulatory Visit (INDEPENDENT_AMBULATORY_CARE_PROVIDER_SITE_OTHER): Payer: Self-pay | Admitting: Physician Assistant

## 2019-06-28 VITALS — BP 104/70 | HR 97 | Temp 97.9°F | Resp 14 | Ht 64.0 in | Wt 163.0 lb

## 2019-06-28 DIAGNOSIS — Z992 Dependence on renal dialysis: Secondary | ICD-10-CM

## 2019-06-28 DIAGNOSIS — N186 End stage renal disease: Secondary | ICD-10-CM

## 2019-06-28 NOTE — Progress Notes (Signed)
  POST OPERATIVE OFFICE NOTE    CC:  F/u for surgery  HPI:  This is a 43 y.o. female who is s/p right IJ tunneled hemodialysis catheter and left second stage basilic vein transposition.  On her last visit she had edema and local ecchymosis.  She is here for left UE check up.    She denise numbness, loss of motor and loss of sensation in the left UE.   No Known Allergies  Current Outpatient Medications  Medication Sig Dispense Refill  . albuterol (VENTOLIN HFA) 108 (90 Base) MCG/ACT inhaler Inhale 2 puffs into the lungs every 4 (four) hours as needed for wheezing or shortness of breath.    Marland Kitchen amLODipine (NORVASC) 10 MG tablet Take 1 tablet (10 mg total) by mouth daily. 90 tablet 0  . bictegravir-emtricitabine-tenofovir AF (BIKTARVY) 50-200-25 MG TABS tablet Take 1 tablet by mouth daily. 30 tablet 11  . calcitRIOL (ROCALTROL) 0.25 MCG capsule Take 1 capsule (0.25 mcg total) by mouth daily. 30 capsule 1  . calcium acetate (PHOSLO) 667 MG capsule Take 2 capsules (1,334 mg total) by mouth 3 (three) times daily with meals. 180 capsule 2  . cephALEXin (KEFLEX) 500 MG capsule Take 1 capsule (500 mg total) by mouth 3 (three) times daily. Patient is on Dialysis and will discuss how to take the medication for skin abscess between HD sessions. 21 capsule 0  . citalopram (CELEXA) 20 MG tablet Take two 20 mg tablets (40 mg total) each day for depression. (Patient taking differently: Take 20 mg by mouth daily. ) 60 tablet 0  . diphenhydrAMINE (BENADRYL) 50 MG tablet Take 0.5 tablets (25 mg total) by mouth every 6 (six) hours as needed for itching (As needed for itching). 30 tablet 0  . gabapentin (NEURONTIN) 300 MG capsule Take 1 capsule (300 mg total) by mouth daily as needed. 90 capsule 0  . glucose blood (ACCU-CHEK GUIDE) test strip USE TO TEST BLOOD SUGAR 3 TIMES DAILY . Diagnosis code  E11.69, E66.9 100 each 11  . hydrALAZINE (APRESOLINE) 25 MG tablet Take 1 tablet (25 mg total) by mouth 3 (three)  times daily. 90 tablet 2  . Insulin Glargine (LANTUS) 100 UNIT/ML Solostar Pen Inject 5 Units into the skin at bedtime. 3 mL 0  . liraglutide (VICTOZA) 18 MG/3ML SOPN Inject 0.2 mLs (1.2 mg total) into the skin daily. 3 mL 1  . multivitamin (RENA-VIT) TABS tablet Take 1 tablet by mouth daily.    Marland Kitchen oxyCODONE-acetaminophen (PERCOCET/ROXICET) 5-325 MG tablet Take 1 tablet by mouth every 6 (six) hours as needed (pain). 20 tablet 0  . polyvinyl alcohol (LIQUIFILM TEARS) 1.4 % ophthalmic solution Place 2 drops into the left eye as needed for dry eyes. 15 mL 0  . triamcinolone (KENALOG) 0.025 % ointment Apply 1 application topically 2 (two) times daily. 30 g 0   No current facility-administered medications for this visit.      ROS:  See HPI  Physical Exam:    Incision:  Well healed left UE incision Extremities:  Palpable thrill throughout the fistula.  Good visibility of the fistula throughout it's course.   Assessment/Plan:  This is a 43 y.o. female who is s/p: First stage Basilic created [id number] and second stage 04/14/2019.  The fistula can be accessed for HD once the fistula is working well the [facility] may be removed.     Roxy Horseman, PA-C [facility] [phone]  Clinic MD:  Oneida Alar

## 2019-06-30 ENCOUNTER — Ambulatory Visit: Payer: Medicaid Other

## 2019-06-30 ENCOUNTER — Encounter (INDEPENDENT_AMBULATORY_CARE_PROVIDER_SITE_OTHER): Payer: Medicaid Other | Admitting: Ophthalmology

## 2019-07-01 DIAGNOSIS — R0602 Shortness of breath: Secondary | ICD-10-CM

## 2019-07-01 DIAGNOSIS — R0600 Dyspnea, unspecified: Secondary | ICD-10-CM | POA: Insufficient documentation

## 2019-07-01 HISTORY — DX: Shortness of breath: R06.02

## 2019-07-03 ENCOUNTER — Ambulatory Visit: Payer: Medicaid Other | Admitting: Physical Therapy

## 2019-07-04 MED FILL — CINACALCET HCL 30 MG TABS: 30 | 90 days supply | Qty: 90 | Fill #0

## 2019-07-05 ENCOUNTER — Encounter (INDEPENDENT_AMBULATORY_CARE_PROVIDER_SITE_OTHER): Payer: Medicaid Other | Admitting: Ophthalmology

## 2019-07-10 ENCOUNTER — Ambulatory Visit: Payer: Medicaid Other | Admitting: Physical Therapy

## 2019-07-12 ENCOUNTER — Ambulatory Visit: Payer: Medicare Other | Admitting: Physical Therapy

## 2019-07-13 NOTE — Progress Notes (Signed)
[facility] Note  07/14/2019     CHIEF COMPLAINT Patient presents for Diabetic Eye Exam   HISTORY OF PRESENT ILLNESS: Kathy Frank is a 43 y.o. female who presents to the clinic today for:   HPI    Diabetic Eye Exam    Vision is blurred for near and is blurred for distance.  Associated Symptoms Pain and Glare.  Negative for Flashes, Distortion, Photophobia, Trauma, Jaw Claudication, Fever, Fatigue, Floaters, Blind Spot, Redness, Scalp Tenderness, Shoulder/Hip pain and Weight Loss.  Diabetes characteristics include Type 1 and on insulin.  This started 23 years ago.  Blood sugar level is controlled.  Last Blood Glucose 89 (This am).  Last A1C 6.9 (1 week ago).  Associated Diagnosis Kidney Disease, Dialysis and Neuropathy.          Comments    Patient states vision blurred OU for the past 6 weeks. Patient had eye infection OU diagnosed about the same time and vision has been blurry every since. Bright lights make vision worse. Patient still has symptoms of watering and itching OU. Taking erythromycin ointment at bedtime in OD only. Patient has been diabetic for about 23 years. Left leg amputated. On dialysis on Tuesday, Thursday, and Saturday. Patient is HIV positive.       Last edited by Jobe Marker, COT on 07/14/2019  9:36 AM. (History)    pt states she saw Dr. Clent Jacks at the beginning of November, she states about a week before Halloween she went to the ED bc she could barely open her eyes and when she did she couldn't see out of them, she states the ED drs told her she scratched the inside of her eye, pt states family members told her she needed to see an eye dr so that is how she ended up with [facility], pt states she is diabetic, but it is under control now, pt states the highest A1c she can remember is a 9, but is on dialysis (T, TH, S), pt had a BTK amputation on her left leg, pt is HIV+  Referring physician: Clent Jacks, MD [address] [place],  [place] [address]  HISTORICAL INFORMATION:   Selected notes from the MEDICAL RECORD NUMBER Referred by Dr. Truman Hayward:  Ocular Hx- PMH-   CURRENT MEDICATIONS: Current Outpatient Medications (Ophthalmic Drugs)  Medication Sig  . erythromycin ophthalmic ointment Place into the right eye at bedtime.  . polyvinyl alcohol (LIQUIFILM TEARS) 1.4 % ophthalmic solution Place 2 drops into the left eye as needed for dry eyes. (Patient taking differently: Place 2 drops into both eyes as needed for dry eyes. )   No current facility-administered medications for this visit.  (Ophthalmic Drugs)   Current Outpatient Medications (Other)  Medication Sig  . albuterol (VENTOLIN HFA) 108 (90 Base) MCG/ACT inhaler Inhale 2 puffs into the lungs every 4 (four) hours as needed for wheezing or shortness of breath.  Marland Kitchen amLODipine (NORVASC) 10 MG tablet Take 1 tablet (10 mg total) by mouth daily.  . B Complex-C-Folic Acid (RENA-VITE RX) 1 MG TABS Take 1 tablet by mouth daily.  . bictegravir-emtricitabine-tenofovir AF (BIKTARVY) 50-200-25 MG TABS tablet Take 1 tablet by mouth daily.  . calcitRIOL (ROCALTROL) 0.25 MCG capsule Take 1 capsule (0.25 mcg total) by mouth daily.  . calcium acetate (PHOSLO) 667 MG capsule Take 2 capsules (1,334 mg total) by mouth 3 (three) times daily with meals.  . calcium acetate (PHOSLO) 667 MG  tablet Take 667 mg by mouth 3 (three) times daily.  . citalopram (CELEXA) 20 MG tablet Take two 20 mg tablets (40 mg total) each day for depression. (Patient taking differently: Take 20 mg by mouth daily. )  . diphenhydrAMINE (BENADRYL) 50 MG tablet Take 0.5 tablets (25 mg total) by mouth every 6 (six) hours as needed for itching (As needed for itching).  . gabapentin (NEURONTIN) 300 MG capsule Take 1 capsule (300 mg total) by mouth daily as needed.  Marland Kitchen glucose blood (ACCU-CHEK GUIDE) test strip USE TO TEST BLOOD SUGAR 3 TIMES DAILY . Diagnosis code  E11.69, E66.9  . Insulin Glargine (LANTUS)  100 UNIT/ML Solostar Pen Inject 5 Units into the skin at bedtime.  . liraglutide (VICTOZA) 18 MG/3ML SOPN Inject 0.2 mLs (1.2 mg total) into the skin daily.  . multivitamin (RENA-VIT) TABS tablet Take 1 tablet by mouth daily.  . SENSIPAR 30 MG tablet Take 30 mg by mouth at bedtime.  . triamcinolone (KENALOG) 0.025 % ointment Apply 1 application topically 2 (two) times daily.  . hydrALAZINE (APRESOLINE) 25 MG tablet Take 1 tablet (25 mg total) by mouth 3 (three) times daily.   No current facility-administered medications for this visit.  (Other)      REVIEW OF SYSTEMS: ROS    Positive for: Genitourinary, Endocrine, Eyes   Negative for: Constitutional, Gastrointestinal, Neurological, Skin, Musculoskeletal, HENT, Cardiovascular, Respiratory, Psychiatric, Allergic/Imm, Heme/Lymph   Last edited by Roselee Nova D, COT on 07/14/2019  9:31 AM. (History)       ALLERGIES No Known Allergies  PAST MEDICAL HISTORY Past Medical History:  Diagnosis Date  . Below-knee amputation of left lower extremity ([facility])   . CKD (chronic kidney disease) stage 5, GFR less than 15 ml/min (HCC) 06/19/2016   06/30/16: Seen by Dr. Posey Pronto [Nephrology]. Suspect etiologies to include diabetes and hypertension though cannot exclude HIV-associated nephropathy. Advised her against from using excessive Goody powder.  . Contact dermatitis 04/21/2016   07/29/16: Skin biopsy performed by [facility] notable for acute spongiotic dermatitis consistent with contact dermatitis.  . Cyclical vomiting    THC induced???  . Depression 06/28/2006   Qualifier: Diagnosis of  By: Riccardo Dubin MD, [place]    . Diabetes mellitus type 2 in obese ([facility]) 06/28/1994  . DKA (diabetic ketoacidoses) ([facility]) 12/2014  . Dyspnea   . Erosive esophagitis   . Esophageal reflux   . Essential hypertension 11/16/2013  . Gastroparesis    ? diabetic  . GERD (gastroesophageal reflux disease)   . History of blood transfusion   . Human  immunodeficiency virus (HIV) disease ([facility]) 04/23/2016  . Hypertension   . Moderate nonproliferative diabetic retinopathy of both eyes ([facility]) 11/21/2014   11/14/14: Noted on retinal imaging; needs follow-up imaging in 6 months  05/22/16: Noted on retinal imaging again; needs follow-up imaging in 6 months  . Normocytic anemia   . TOBACCO USER 06/28/2006   Qualifier: Diagnosis of  By: Riccardo Dubin MD, Sherren Mocha    . Type II diabetes mellitus ([facility]) 1994   diagnosed around 23  . Wound dehiscence   . Wound infection s/p L transmetatarsal amputation    Past Surgical History:  Procedure Laterality Date  . AMPUTATION Left 07/08/2018   Procedure: AMPUTATION FORTH RAY LEFT FOOT;  Surgeon: [name], MD;  Location: [facility];  Service: Orthopedics;  Laterality: Left;  . AMPUTATION Left 08/09/2018   Procedure: Left Transmetatarsal Amputation;  Surgeon: [name], MD;  Location: [facility];  Service:  Orthopedics;  Laterality: Left;  . AMPUTATION Left 10/08/2018   Procedure: LEFT BELOW KNEE AMPUTATION;  Surgeon: [name], MD;  Location: [facility];  Service: Orthopedics;  Laterality: Left;  . AMPUTATION Left 10/28/2018   Procedure: REVISION BELOW KNEE AMPUTATION;  Surgeon: [name], MD;  Location: [facility];  Service: Orthopedics;  Laterality: Left;  . AMPUTATION Left 12/16/2018   Procedure: LEFT ABOVE KNEE AMPUTATION;  Surgeon: [name], MD;  Location: [facility];  Service: Orthopedics;  Laterality: Left;  . AV FISTULA PLACEMENT Left 10/14/2018   Procedure: Arteriovenous (Av) Fistula Creation Left Arm;  Surgeon: [name], MD;  Location: [facility];  Service: Vascular;  Laterality: Left;  . BASCILIC VEIN TRANSPOSITION Left 04/14/2019   Procedure: BASILIC VEIN TRANSPOSITION SECOND STAGE LEFT ARM;  Surgeon: Rosetta Posner, MD;  Location: [facility];  Service: Vascular;  Laterality: Left;  . INSERTION OF DIALYSIS CATHETER Right 04/14/2019   Procedure: INSERTION OF DIALYSIS CATHETER;  Surgeon: Rosetta Posner, MD;   Location: [facility];  Service: Vascular;  Laterality: Right;  . LOWER EXTREMITY ANGIOGRAPHY N/A 07/05/2018   Procedure: LOWER EXTREMITY ANGIOGRAPHY;  Surgeon: Serafina Mitchell, MD;  Location: [facility];  Service: Cardiovascular;  Laterality: N/A;  . PERIPHERAL VASCULAR BALLOON ANGIOPLASTY Left 07/05/2018   Procedure: PERIPHERAL VASCULAR BALLOON ANGIOPLASTY;  Surgeon: Serafina Mitchell, MD;  Location: [facility];  Service: Cardiovascular;  Laterality: Left;  SFA  . STUMP REVISION Left 10/19/2018   Procedure: REVISION LEFT BELOW KNEE AMPUTATION;  Surgeon: [name], MD;  Location: [facility];  Service: Orthopedics;  Laterality: Left;  . TUBAL LIGATION  2002    FAMILY HISTORY Family History  Problem Relation Age of Onset  . Diabetes Mother   . Diabetes Brother   . Diabetes Daughter   . Diabetes Daughter   . Mental retardation Brother        died from PNA  . Diabetes Maternal Grandmother     SOCIAL HISTORY Social History   Tobacco Use  . Smoking status: Former Smoker    Packs/day: 0.10    Years: 10.00    Pack years: 1.00    Types: Cigarettes    Start date: 11/17/2013    Quit date: 09/09/2014    Years since quitting: 4.8  . Smokeless tobacco: Never Used  Substance Use Topics  . Alcohol use: Yes    Comment: rare  . Drug use: Yes    Frequency: 4.0 times per week    Types: Marijuana         OPHTHALMIC EXAM:  Base Eye Exam    Visual Acuity (Snellen - Linear)      Right Left   Dist [place] CF at 2' 20/50 -1   Dist ph [place] NI 20/40       Tonometry (Tonopen, 9:51 AM)      Right Left   Pressure 10 11       Pupils      Dark Light Shape React APD   Right 3 2 Round Slow None   Left 3 2 Round Slow None       Visual Fields (Counting fingers)      Left Right    Full    Restrictions  Total superior temporal, superior nasal deficiencies       Extraocular Movement      Right Left    Full, Ortho Full, Ortho       Neuro/Psych  Oriented x3: Yes   Mood/Affect:  Normal       Dilation    Both eyes: 1.0% Mydriacyl, 2.5% Phenylephrine @ 9:51 AM        Slit Lamp and Fundus Exam    Slit Lamp Exam      Right Left   Lids/Lashes Normal Normal   Conjunctiva/Sclera Melanosis Melanosis   Cornea focal temporal haze focal nasal haze   Anterior Chamber Deep and quiet Deep and quiet   Iris Round and dilated, No NVI Round and dilated, No NVI   Lens 2-3+ Nuclear sclerosis, 1-2+ Cortical cataract 2-3+ Nuclear sclerosis, 1-2+ Cortical cataract   Vitreous Vitreous syneresis Vitreous syneresis       Fundus Exam      Right Left   Disc fine NVD, mild fibrosis, 360 peripapillary sub-hyaloud heme Pink and Sharp   C/D Ratio  0.2   Macula subhyaloid heme obscuring inferior macula Flat, Good foveal reflex, Retinal pigment epithelial mottling, scattered Microaneurysms   Vessels Vascular attenuation, Tortuous, fibrotic NVE along IT arcades Vascular attenuation, Tortuous, +NV midzone 360   Periphery Scattered pre-retinal and IRH, large inferior boat heme Scattered MA's 360, scattered IRH        Refraction    Manifest Refraction (Auto)      Sphere Cylinder Axis Dist VA   Right +3.75 +1.50 165 CF   Left -1.25 +0.50 022 20/30+2          IMAGING AND PROCEDURES  Imaging and Procedures for @TODAY @  OCT, Retina - OU - Both Eyes       Right Eye Quality was poor. Progression has no prior data. Findings include abnormal foveal contour, intraretinal fluid, vitreous traction (Large subhyaloid heme obscuring macula).   Left Eye Quality was good. Central Foveal Thickness: 252. Progression has no prior data. Findings include normal foveal contour, no IRF, no SRF, vitreomacular adhesion .   Notes *Images captured and stored on drive  Diagnosis / Impression:  OD: +subhyaloid heme; +vitreous traction w/ edema OS: NFP; no IRF/SRF; +VMA   Clinical management:  See below  Abbreviations: NFP - Normal foveal profile. CME - cystoid macular edema. PED - pigment  epithelial detachment. IRF - intraretinal fluid. SRF - subretinal fluid. EZ - ellipsoid zone. ERM - epiretinal membrane. ORA - outer retinal atrophy. ORT - outer retinal tubulation. SRHM - subretinal hyper-reflective material        Fluorescein Angiography Optos (Transit OD)       Right Eye   Progression has no prior data. Early phase findings include microaneurysm, blockage, vascular perfusion defect, neovascularization disc, retinal neovascularization. Mid/Late phase findings include microaneurysm, neovascularization disc, leakage, retinal neovascularization, vascular perfusion defect.   Left Eye   Progression has no prior data. Early phase findings include microaneurysm, retinal neovascularization, vascular perfusion defect. Mid/Late phase findings include leakage, microaneurysm, retinal neovascularization, vascular perfusion defect ([place] NV leakage 360 midzone).   Notes **Images stored on drive**  Impression: PDR OU (OD>OS) Severe hyperfluorescent leakage from 360 midzonal NVE OU OD with significant blockage from subhyaloid and vitreous heme        Intravitreal Injection, Pharmacologic Agent - OD - Right Eye       Time Out 07/14/2019. 11:10 AM. Confirmed correct patient, procedure, site, and patient consented.   Anesthesia Topical anesthesia was used. Anesthetic medications included Lidocaine 2%, Proparacaine 0.5%.   Procedure Preparation included 5% betadine to ocular surface, eyelid speculum. A 30 gauge needle was used.   Injection:  1.25 mg Bevacizumab (AVASTIN)  SOLN   NDC: H061816, Lot: [id number] , Expiration date: 08/16/2019   Route: Intravitreal, Site: Right Eye, Waste: 0 mL  Post-op Post injection exam found visual acuity of at least counting fingers. The patient tolerated the procedure well. There were no complications. The patient received written and verbal post procedure care education.        Intravitreal Injection, Pharmacologic Agent - OS -  Left Eye       Time Out 07/14/2019. 11:11 AM. Confirmed correct patient, procedure, site, and patient consented.   Anesthesia Topical anesthesia was used. Anesthetic medications included Lidocaine 2%, Proparacaine 0.5%.   Procedure Preparation included 5% betadine to ocular surface, eyelid speculum. A 30 gauge needle was used.   Injection:  1.25 mg Bevacizumab (AVASTIN) SOLN   NDC: 25/11/2018, Lot: [id number] , Expiration date: 09/27/2019   Route: Intravitreal, Site: Left Eye, Waste: 0 mL  Post-op Post injection exam found visual acuity of at least counting fingers. The patient tolerated the procedure well. There were no complications. The patient received written and verbal post procedure care education.                 ASSESSMENT/PLAN:    ICD-10-CM   1. Proliferative diabetic retinopathy of both eyes with macular edema associated with type 2 diabetes mellitus ([facility])  10/10/2019   2. Vitreous hemorrhage, right eye (HCC)  H43.11   3. Retinal edema  H35.81 OCT, Retina - OU - Both Eyes    Intravitreal Injection, Pharmacologic Agent - OD - Right Eye    Intravitreal Injection, Pharmacologic Agent - OS - Left Eye  4. Essential hypertension  I10   5. Hypertensive retinopathy of both eyes  H35.033 Fluorescein Angiography Optos (Transit OD)  6. Combined forms of age-related cataract of both eyes  H25.813   7. Asymptomatic HIV infection ([facility])  Z21     1-3. Proliferative diabetic retinopathy OU w/ vitreous hemorrhage OD  - The incidence, risk factors for progression, natural history and treatment options for diabetic retinopathy were discussed with patient.    - The need for close monitoring of blood glucose, blood pressure, and serum lipids, avoiding cigarette or any type of tobacco, and the need for long term follow up was also discussed with patient.  - exam shows large preretinal/subhyaloid heme and florid fibrotic NVE OD, OS with early fibrotic NV  - FA today (12.04.20) shows  severe NVE OU  - OCT shows mild tractional edema OD; no CSME OS  - discussed findings and prognosis and likelihood of extensive treatment required to stabilize eyes and improve vision OD  - recommend IVA OU today, 12.04.20  - pt wishes to proceed with IVA today  - RBA of procedure discussed, questions answered  - informed consent obtained and signed  - see procedure note  - f/u in 1 wk for PRP  4,5. Hypertensive retinopathy OU  - discussed importance of tight BP control  - monitor  6. Mixed form age related cataract OU  - The symptoms of cataract, surgical options, and treatments and risks were discussed with patient.  - discussed diagnosis and progression  - not yet visually significant  - monitor for now  7. HIV+  - on Biktarvy  - CD4: 526 07.16.20   Ophthalmic Meds Ordered this visit:  No orders of the defined types were placed in this encounter.      Return in about 1 week (around 07/21/2019) for f/u PDR OU, DFE, OCT.  There are no Patient Instructions on file for  this visit.   Explained the diagnoses, plan, and follow up with the patient and they expressed understanding.  Patient expressed understanding of the importance of proper follow up care.   This document serves as a record of services personally performed by Gardiner Sleeper, MD, PhD. It was created on their behalf by Ernest Mallick, OA, an ophthalmic assistant. The creation of this record is the provider's dictation and/or activities during the visit.    Electronically signed by: Ernest Mallick, OA 12.03.2020 12:03 AM   Gardiner Sleeper, M.D., Ph.D. Diseases & Surgery of the Retina and Vitreous [facility]  I have reviewed the above documentation for accuracy and completeness, and I agree with the above. Gardiner Sleeper, M.D., Ph.D. 07/15/19 12:03 AM   Abbreviations: M myopia (nearsighted); A astigmatism; H hyperopia (farsighted); P presbyopia; Mrx spectacle prescription;  CTL contact  lenses; OD right eye; OS left eye; OU both eyes  XT exotropia; ET esotropia; PEK punctate epithelial keratitis; PEE punctate epithelial erosions; DES dry eye syndrome; MGD meibomian gland dysfunction; ATs artificial tears; PFAT's preservative free artificial tears; [place] nuclear sclerotic cataract; PSC posterior subcapsular cataract; ERM epi-retinal membrane; PVD posterior vitreous detachment; RD retinal detachment; DM diabetes mellitus; DR diabetic retinopathy; NPDR non-proliferative diabetic retinopathy; PDR proliferative diabetic retinopathy; CSME clinically significant macular edema; DME diabetic macular edema; dbh dot blot hemorrhages; CWS cotton wool spot; POAG primary open angle glaucoma; C/D cup-to-disc ratio; HVF humphrey visual field; GVF goldmann visual field; OCT optical coherence tomography; IOP intraocular pressure; BRVO Branch retinal vein occlusion; CRVO central retinal vein occlusion; CRAO central retinal artery occlusion; BRAO branch retinal artery occlusion; RT retinal tear; SB scleral buckle; PPV pars plana vitrectomy; VH Vitreous hemorrhage; PRP panretinal laser photocoagulation; IVK intravitreal kenalog; VMT vitreomacular traction; MH Macular hole;  NVD neovascularization of the disc; NVE neovascularization elsewhere; AREDS age related eye disease study; ARMD age related macular degeneration; POAG primary open angle glaucoma; EBMD epithelial/anterior basement membrane dystrophy; ACIOL anterior chamber intraocular lens; IOL intraocular lens; PCIOL posterior chamber intraocular lens; Phaco/IOL phacoemulsification with intraocular lens placement; [place] photorefractive keratectomy; LASIK laser assisted in situ keratomileusis; HTN hypertension; DM diabetes mellitus; COPD chronic obstructive pulmonary disease

## 2019-07-14 ENCOUNTER — Other Ambulatory Visit: Payer: Self-pay

## 2019-07-14 ENCOUNTER — Encounter (INDEPENDENT_AMBULATORY_CARE_PROVIDER_SITE_OTHER): Payer: Self-pay | Admitting: Ophthalmology

## 2019-07-14 ENCOUNTER — Ambulatory Visit (INDEPENDENT_AMBULATORY_CARE_PROVIDER_SITE_OTHER): Payer: Medicare Other | Admitting: Ophthalmology

## 2019-07-14 DIAGNOSIS — H3581 Retinal edema: Secondary | ICD-10-CM

## 2019-07-14 DIAGNOSIS — E113513 Type 2 diabetes mellitus with proliferative diabetic retinopathy with macular edema, bilateral: Secondary | ICD-10-CM

## 2019-07-14 DIAGNOSIS — I10 Essential (primary) hypertension: Secondary | ICD-10-CM

## 2019-07-14 DIAGNOSIS — H35033 Hypertensive retinopathy, bilateral: Secondary | ICD-10-CM

## 2019-07-14 DIAGNOSIS — H25813 Combined forms of age-related cataract, bilateral: Secondary | ICD-10-CM

## 2019-07-14 DIAGNOSIS — Z21 Asymptomatic human immunodeficiency virus [HIV] infection status: Secondary | ICD-10-CM

## 2019-07-14 DIAGNOSIS — H4311 Vitreous hemorrhage, right eye: Secondary | ICD-10-CM

## 2019-07-15 ENCOUNTER — Encounter (INDEPENDENT_AMBULATORY_CARE_PROVIDER_SITE_OTHER): Payer: Self-pay | Admitting: Ophthalmology

## 2019-07-15 MED ORDER — BEVACIZUMAB CHEMO INJECTION 1.25MG/0.05ML SYRINGE FOR KALEIDOSCOPE
1.2500 mg | INTRAVITREAL | Status: AC | PRN
  Administered 2019-07-15: 1.25 mg via INTRAVITREAL

## 2019-07-17 ENCOUNTER — Ambulatory Visit: Payer: Medicare Other | Admitting: Physical Therapy

## 2019-07-19 ENCOUNTER — Ambulatory Visit: Payer: Medicare Other | Admitting: Physical Therapy

## 2019-07-20 MED FILL — BIKTARVY 50-200-25 MG TABS: 50-200-25 | 30 days supply | Qty: 30 | Fill #5

## 2019-07-20 MED FILL — AMLODIPINE BESYLATE 10 MG T: 10 | 90 days supply | Qty: 90 | Fill #0

## 2019-07-21 ENCOUNTER — Ambulatory Visit (INDEPENDENT_AMBULATORY_CARE_PROVIDER_SITE_OTHER): Payer: Medicare Other | Admitting: Ophthalmology

## 2019-07-21 ENCOUNTER — Encounter (INDEPENDENT_AMBULATORY_CARE_PROVIDER_SITE_OTHER): Payer: Self-pay | Admitting: Ophthalmology

## 2019-07-21 ENCOUNTER — Other Ambulatory Visit: Payer: Self-pay

## 2019-07-21 DIAGNOSIS — H25813 Combined forms of age-related cataract, bilateral: Secondary | ICD-10-CM

## 2019-07-21 DIAGNOSIS — H4311 Vitreous hemorrhage, right eye: Secondary | ICD-10-CM

## 2019-07-21 DIAGNOSIS — E113513 Type 2 diabetes mellitus with proliferative diabetic retinopathy with macular edema, bilateral: Secondary | ICD-10-CM | POA: Diagnosis not present

## 2019-07-21 DIAGNOSIS — H3581 Retinal edema: Secondary | ICD-10-CM | POA: Diagnosis not present

## 2019-07-21 DIAGNOSIS — I10 Essential (primary) hypertension: Secondary | ICD-10-CM

## 2019-07-21 DIAGNOSIS — Z21 Asymptomatic human immunodeficiency virus [HIV] infection status: Secondary | ICD-10-CM

## 2019-07-21 DIAGNOSIS — H35033 Hypertensive retinopathy, bilateral: Secondary | ICD-10-CM

## 2019-07-21 MED ORDER — PREDNISOLONE ACETATE 1 % OP SUSP: 1.0000 [drp] | Freq: Four times a day (QID) | OPHTHALMIC | 0 refills | Status: AC

## 2019-07-21 NOTE — Progress Notes (Addendum)
[facility] Note  07/21/2019     CHIEF COMPLAINT Patient presents for Retina Follow Up   HISTORY OF PRESENT ILLNESS: Kathy Frank is a 43 y.o. female who presents to the clinic today for:   HPI    Retina Follow Up    Patient presents with  Diabetic Retinopathy.  In both eyes.  This started weeks ago.  Severity is moderate.  Duration of weeks.  Since onset it is stable.  I, the attending physician,  performed the HPI with the patient and updated documentation appropriately.          Comments    Pt states her vision is unchanged OD.  Paitent denies eye pain or discomfort and denies any new or worsening floaters or fol OU.       Last edited by Bernarda Caffey, MD on 07/21/2019  4:22 PM. (History)    pt here for PRP OD today, pt states her vision has improved since receiving injections at the last vist  Referring physician: Clent Jacks, MD [address] [place],  [place] [address]  HISTORICAL INFORMATION:   Selected notes from the MEDICAL RECORD NUMBER Referred by Dr. Elliot Dally LEE:  Ocular Hx- PMH-   CURRENT MEDICATIONS: Current Outpatient Medications (Ophthalmic Drugs)  Medication Sig  . erythromycin ophthalmic ointment Place into the right eye at bedtime.  . polyvinyl alcohol (LIQUIFILM TEARS) 1.4 % ophthalmic solution Place 2 drops into the left eye as needed for dry eyes. (Patient taking differently: Place 2 drops into both eyes as needed for dry eyes. )  . prednisoLONE acetate (PRED FORTE) 1 % ophthalmic suspension Place 1 drop into the right eye 4 (four) times daily for 7 days.   No current facility-administered medications for this visit. (Ophthalmic Drugs)   Current Outpatient Medications (Other)  Medication Sig  . albuterol (VENTOLIN HFA) 108 (90 Base) MCG/ACT inhaler Inhale 2 puffs into the lungs every 4 (four) hours as needed for wheezing or shortness of breath.  Marland Kitchen amLODipine (NORVASC) 10 MG tablet Take 1 tablet (10 mg total)  by mouth daily.  . B Complex-C-Folic Acid (RENA-VITE RX) 1 MG TABS Take 1 tablet by mouth daily.  . bictegravir-emtricitabine-tenofovir AF (BIKTARVY) 50-200-25 MG TABS tablet Take 1 tablet by mouth daily.  . calcitRIOL (ROCALTROL) 0.25 MCG capsule Take 1 capsule (0.25 mcg total) by mouth daily.  . calcium acetate (PHOSLO) 667 MG capsule Take 2 capsules (1,334 mg total) by mouth 3 (three) times daily with meals.  . calcium acetate (PHOSLO) 667 MG tablet Take 667 mg by mouth 3 (three) times daily.  . citalopram (CELEXA) 20 MG tablet Take two 20 mg tablets (40 mg total) each day for depression. (Patient taking differently: Take 20 mg by mouth daily. )  . diphenhydrAMINE (BENADRYL) 50 MG tablet Take 0.5 tablets (25 mg total) by mouth every 6 (six) hours as needed for itching (As needed for itching).  . gabapentin (NEURONTIN) 300 MG capsule Take 1 capsule (300 mg total) by mouth daily as needed.  Marland Kitchen glucose blood (ACCU-CHEK GUIDE) test strip USE TO TEST BLOOD SUGAR 3 TIMES DAILY . Diagnosis code  E11.69, E66.9  . hydrALAZINE (APRESOLINE) 25 MG tablet Take 1 tablet (25 mg total) by mouth 3 (three) times daily.  . Insulin Glargine (LANTUS) 100 UNIT/ML Solostar Pen Inject 5 Units into the skin at bedtime.  . liraglutide (VICTOZA) 18 MG/3ML SOPN Inject 0.2 mLs (1.2 mg total) into the skin  daily.  . multivitamin (RENA-VIT) TABS tablet Take 1 tablet by mouth daily.  . SENSIPAR 30 MG tablet Take 30 mg by mouth at bedtime.  . triamcinolone (KENALOG) 0.025 % ointment Apply 1 application topically 2 (two) times daily.   No current facility-administered medications for this visit. (Other)      REVIEW OF SYSTEMS: ROS    Positive for: Genitourinary, Endocrine, Eyes   Negative for: Constitutional, Gastrointestinal, Neurological, Skin, Musculoskeletal, HENT, Cardiovascular, Respiratory, Psychiatric, Allergic/Imm, Heme/Lymph   Last edited by Doneen Poisson on 07/21/2019  2:15 PM. (History)        ALLERGIES No Known Allergies  PAST MEDICAL HISTORY Past Medical History:  Diagnosis Date  . Below-knee amputation of left lower extremity ([place])   . CKD (chronic kidney disease) stage 5, GFR less than 15 ml/min (HCC) 06/19/2016   06/30/16: Seen by Dr. Posey Pronto [Nephrology]. Suspect etiologies to include diabetes and hypertension though cannot exclude HIV-associated nephropathy. Advised her against from using excessive Goody powder.  . Contact dermatitis 04/21/2016   07/29/16: Skin biopsy performed by [facility] notable for acute spongiotic dermatitis consistent with contact dermatitis.  . Cyclical vomiting    THC induced???  . Depression 06/28/2006   Qualifier: Diagnosis of  By: Riccardo Dubin MD, Todd    . Diabetes mellitus type 2 in obese ([place]) 06/28/1994  . DKA (diabetic ketoacidoses) ([place]) 12/2014  . Dyspnea   . Erosive esophagitis   . Esophageal reflux   . Essential hypertension 11/16/2013  . Gastroparesis    ? diabetic  . GERD (gastroesophageal reflux disease)   . History of blood transfusion   . Human immunodeficiency virus (HIV) disease ([place]) 04/23/2016  . Hypertension   . Moderate nonproliferative diabetic retinopathy of both eyes ([place]) 11/21/2014   11/14/14: Noted on retinal imaging; needs follow-up imaging in 6 months  05/22/16: Noted on retinal imaging again; needs follow-up imaging in 6 months  . Normocytic anemia   . TOBACCO USER 06/28/2006   Qualifier: Diagnosis of  By: Riccardo Dubin MD, Sherren Mocha    . Type II diabetes mellitus ([place]) 1994   diagnosed around 32  . Wound dehiscence   . Wound infection s/p L transmetatarsal amputation    Past Surgical History:  Procedure Laterality Date  . AMPUTATION Left 07/08/2018   Procedure: AMPUTATION FORTH RAY LEFT FOOT;  Surgeon: [name], MD;  Location: [place];  Service: Orthopedics;  Laterality: Left;  . AMPUTATION Left 08/09/2018   Procedure: Left Transmetatarsal Amputation;  Surgeon: [name], MD;  Location: [place];  Service: Orthopedics;  Laterality: Left;  . AMPUTATION Left 10/08/2018   Procedure: LEFT BELOW KNEE AMPUTATION;  Surgeon: [name], MD;  Location: [facility];  Service: Orthopedics;  Laterality: Left;  . AMPUTATION Left 10/28/2018   Procedure: REVISION BELOW KNEE AMPUTATION;  Surgeon: [name], MD;  Location: [place];  Service: Orthopedics;  Laterality: Left;  . AMPUTATION Left 12/16/2018   Procedure: LEFT ABOVE KNEE AMPUTATION;  Surgeon: [name], MD;  Location: [place];  Service: Orthopedics;  Laterality: Left;  . AV FISTULA PLACEMENT Left 10/14/2018   Procedure: Arteriovenous (Av) Fistula Creation Left Arm;  Surgeon: [name], MD;  Location: [place];  Service: Vascular;  Laterality: Left;  . BASCILIC VEIN TRANSPOSITION Left 04/14/2019   Procedure: BASILIC VEIN TRANSPOSITION SECOND STAGE LEFT ARM;  Surgeon: Rosetta Posner, MD;  Location: [place];  Service: Vascular;  Laterality: Left;  . INSERTION OF DIALYSIS CATHETER Right  04/14/2019   Procedure: INSERTION OF DIALYSIS CATHETER;  Surgeon: Rosetta Posner, MD;  Location: [facility];  Service: Vascular;  Laterality: Right;  . LOWER EXTREMITY ANGIOGRAPHY N/A 07/05/2018   Procedure: LOWER EXTREMITY ANGIOGRAPHY;  Surgeon: Serafina Mitchell, MD;  Location: [facility];  Service: Cardiovascular;  Laterality: N/A;  . PERIPHERAL VASCULAR BALLOON ANGIOPLASTY Left 07/05/2018   Procedure: PERIPHERAL VASCULAR BALLOON ANGIOPLASTY;  Surgeon: Serafina Mitchell, MD;  Location: [facility];  Service: Cardiovascular;  Laterality: Left;  SFA  . STUMP REVISION Left 10/19/2018   Procedure: REVISION LEFT BELOW KNEE AMPUTATION;  Surgeon: [name], MD;  Location: [facility];  Service: Orthopedics;  Laterality: Left;  . TUBAL LIGATION  2002    FAMILY HISTORY Family History  Problem Relation Age of Onset  . Diabetes Mother   . Diabetes Brother   . Diabetes Daughter   . Diabetes Daughter   . Mental retardation Brother        died from PNA  .  Diabetes Maternal Grandmother     SOCIAL HISTORY Social History   Tobacco Use  . Smoking status: Former Smoker    Packs/day: 0.10    Years: 10.00    Pack years: 1.00    Types: Cigarettes    Start date: 11/17/2013    Quit date: 09/09/2014    Years since quitting: 4.8  . Smokeless tobacco: Never Used  Substance Use Topics  . Alcohol use: Yes    Comment: rare  . Drug use: Yes    Frequency: 4.0 times per week    Types: Marijuana         OPHTHALMIC EXAM:  Base Eye Exam    Visual Acuity (Snellen - Linear)      Right Left   Dist [place] 20/200 -1 20/50 -1   Dist ph [place] NI 20/30       Tonometry (Tonopen, 2:17 PM)      Right Left   Pressure 18 17       Pupils      Dark Light Shape React APD   Right 3 2 Round Minimal 0   Left 3 2 Round Minimal 0       Visual Fields      Left Right    Full    Restrictions  Total superior nasal deficiency       Extraocular Movement      Right Left    Full Full       Neuro/Psych    Oriented x3: Yes   Mood/Affect: Normal       Dilation    Both eyes: 1.0% Mydriacyl, 2.5% Phenylephrine @ 2:17 PM        Slit Lamp and Fundus Exam    Slit Lamp Exam      Right Left   Lids/Lashes Normal Normal   Conjunctiva/Sclera Melanosis Melanosis   Cornea focal temporal haze focal nasal haze   Anterior Chamber Deep and quiet Deep and quiet   Iris Round and dilated, No NVI Round and dilated, No NVI   Lens 2-3+ Nuclear sclerosis, 1-2+ Cortical cataract 2-3+ Nuclear sclerosis, 1-2+ Cortical cataract   Vitreous Vitreous syneresis Vitreous syneresis       Fundus Exam      Right Left   Disc fine NVD, mild fibrosis, 360 peripapillary sub-hyaloid heme improved Pink and Sharp   C/D Ratio  0.2   Macula Blunted foveal reflex; scattered IRH; fibrotic NVE along IT arcades Flat, Good foveal  reflex, Retinal pigment epithelial mottling, scattered Microaneurysms   Vessels Vascular attenuation, Tortuous, fibrotic NVE along IT arcades Vascular attenuation,  Tortuous, +NV midzone 360   Periphery Scattered pre-retinal and IRH, large inferior subhyaloid heme Scattered MA's 360, scattered IRH          IMAGING AND PROCEDURES  Imaging and Procedures for @TODAY @  OCT, Retina - OU - Both Eyes       Right Eye Quality was poor. Progression has improved. Findings include abnormal foveal contour, intraretinal fluid, vitreous traction, no SRF, preretinal fibrosis (subhyaloid heme  -- improved; tractional fibrosis along arcades).   Left Eye Quality was good. Central Foveal Thickness: 252. Progression has been stable. Findings include normal foveal contour, no IRF, no SRF, vitreomacular adhesion  (Blunted foveal profiloe).   Notes *Images captured and stored on drive  Diagnosis / Impression:  OD: subhyaloid heme  -- improved; tractional fibrosis along arcades OS: NFP; no IRF/SRF; +VMA   Clinical management:  See below  Abbreviations: NFP - Normal foveal profile. CME - cystoid macular edema. PED - pigment epithelial detachment. IRF - intraretinal fluid. SRF - subretinal fluid. EZ - ellipsoid zone. ERM - epiretinal membrane. ORA - outer retinal atrophy. ORT - outer retinal tubulation. SRHM - subretinal hyper-reflective material        Panretinal Photocoagulation - OD - Right Eye       LASER PROCEDURE NOTE  Diagnosis:   Proliferative Diabetic Retinopathy, RIGHT EYE  Procedure:  Pan-retinal photocoagulation using slit lamp laser, RIGHT EYE, fill-in  Anesthesia:  Topical  Surgeon: Bernarda Caffey, MD, PhD   Informed consent obtained, operative eye marked, and time out performed prior to initiation of laser.   [place] [id number] slit lamp laser Pattern: 3x3 square Power: 280 mW Duration: 30 msec  Spot size: 200 microns  # spots: [id number] spots  Complications: None.  Notes: vitreous and preretinal heme obscuring view and preventing laser up take inferiorly, temporally and scattered focal areas  RTC: 2 wks for PRP, contralateral eye  (OS)  Patient tolerated the procedure well and received written and verbal post-procedure care information/education.                  ASSESSMENT/PLAN:    ICD-10-CM   1. Proliferative diabetic retinopathy of both eyes with macular edema associated with type 2 diabetes mellitus ([place])  [id number] Panretinal Photocoagulation - OD - Right Eye  2. Vitreous hemorrhage, right eye (HCC)  H43.11   3. Retinal edema  H35.81 OCT, Retina - OU - Both Eyes  4. Essential hypertension  I10   5. Hypertensive retinopathy of both eyes  H35.033   6. Combined forms of age-related cataract of both eyes  H25.813   7. Asymptomatic HIV infection ([place])  Z21     1-3. Proliferative diabetic retinopathy OU w/ vitreous hemorrhage OD  - s/p IVA OU #1 (12.04.20)  - exam shows interval improvement in large preretinal/subhyaloid heme and florid fibrotic NVE OD, OS with early fibrotic NV  - FA (12.04.20) shows severe NVE OU  - OCT shows mild tractional edema OD; no CSME OS  - discussed findings and prognosis and likelihood of extensive treatment required to stabilize eyes and improve vision OD  - recommend PRP OD today, 12.11.20  - RBA of procedure discussed, questions answered  - informed consent obtained and signed  - see procedure note  - start PF QID OD x7d  - f/u week of December 21 for PRP OS  4,5. Hypertensive retinopathy OU  -  discussed importance of tight BP control  - monitor  6. Mixed form age related cataract OU  - The symptoms of cataract, surgical options, and treatments and risks were discussed with patient.  - discussed diagnosis and progression  - not yet visually significant  - monitor for now  7. HIV+  - on Biktarvy  - CD4: [id number] 07.16.20   Ophthalmic Meds Ordered this visit:  Meds ordered this encounter  Medications  . prednisoLONE acetate (PRED FORTE) 1 % ophthalmic suspension    Sig: Place 1 drop into the right eye 4 (four) times daily for 7 days.    Dispense:  10 mL     Refill:  0       Return for wk of Dec 21, Laser PRP OS.  There are no Patient Instructions on file for this visit.   Explained the diagnoses, plan, and follow up with the patient and they expressed understanding.  Patient expressed understanding of the importance of proper follow up care.   This document serves as a record of services personally performed by Gardiner Sleeper, MD, PhD. It was created on their behalf by Ernest Mallick, OA, an ophthalmic assistant. The creation of this record is the provider's dictation and/or activities during the visit.    Electronically signed by: Ernest Mallick, OA 12.11.2020 12:43 AM   Gardiner Sleeper, M.D., Ph.D. Diseases & Surgery of the Retina and Vitreous [facility]  I have reviewed the above documentation for accuracy and completeness, and I agree with the above. Gardiner Sleeper, M.D., Ph.D. 07/23/19 12:46 AM    Abbreviations: M myopia (nearsighted); A astigmatism; H hyperopia (farsighted); P presbyopia; Mrx spectacle prescription;  CTL contact lenses; OD right eye; OS left eye; OU both eyes  XT exotropia; ET esotropia; PEK punctate epithelial keratitis; PEE punctate epithelial erosions; DES dry eye syndrome; MGD meibomian gland dysfunction; ATs artificial tears; PFAT's preservative free artificial tears; [place] nuclear sclerotic cataract; PSC posterior subcapsular cataract; ERM epi-retinal membrane; PVD posterior vitreous detachment; RD retinal detachment; DM diabetes mellitus; DR diabetic retinopathy; NPDR non-proliferative diabetic retinopathy; PDR proliferative diabetic retinopathy; CSME clinically significant macular edema; DME diabetic macular edema; dbh dot blot hemorrhages; CWS cotton wool spot; POAG primary open angle glaucoma; C/D cup-to-disc ratio; HVF humphrey visual field; GVF goldmann visual field; OCT optical coherence tomography; IOP intraocular pressure; BRVO Branch retinal vein occlusion; CRVO central retinal vein  occlusion; CRAO central retinal artery occlusion; BRAO branch retinal artery occlusion; RT retinal tear; SB scleral buckle; PPV pars plana vitrectomy; VH Vitreous hemorrhage; PRP panretinal laser photocoagulation; IVK intravitreal kenalog; VMT vitreomacular traction; MH Macular hole;  NVD neovascularization of the disc; NVE neovascularization elsewhere; AREDS age related eye disease study; ARMD age related macular degeneration; POAG primary open angle glaucoma; EBMD epithelial/anterior basement membrane dystrophy; ACIOL anterior chamber intraocular lens; IOL intraocular lens; PCIOL posterior chamber intraocular lens; Phaco/IOL phacoemulsification with intraocular lens placement; [place] photorefractive keratectomy; LASIK laser assisted in situ keratomileusis; HTN hypertension; DM diabetes mellitus; COPD chronic obstructive pulmonary disease

## 2019-07-24 ENCOUNTER — Encounter: Payer: Medicaid Other | Admitting: Physical Therapy

## 2019-07-26 ENCOUNTER — Encounter: Payer: Medicaid Other | Admitting: Physical Therapy

## 2019-07-31 ENCOUNTER — Encounter: Payer: Medicaid Other | Admitting: Physical Therapy

## 2019-08-01 ENCOUNTER — Telehealth: Payer: Self-pay

## 2019-08-01 NOTE — Telephone Encounter (Signed)
Will hold to address medication with pt at visit tomorrow.

## 2019-08-01 NOTE — Telephone Encounter (Signed)
Patient called with pain on her foot and blister on her toe Made her an appt for tomorrow, but she requests a Rx for pain medication before tomorrows appt

## 2019-08-02 ENCOUNTER — Encounter: Payer: Medicaid Other | Admitting: Physical Therapy

## 2019-08-02 ENCOUNTER — Ambulatory Visit: Payer: Medicaid Other | Admitting: Physician Assistant

## 2019-08-02 NOTE — Telephone Encounter (Signed)
Pt appt cx for today because she over slept and will come in the office on Tuesday will address pain medication need at that time.

## 2019-08-08 ENCOUNTER — Ambulatory Visit (INDEPENDENT_AMBULATORY_CARE_PROVIDER_SITE_OTHER): Payer: Medicare Other | Admitting: Physician Assistant

## 2019-08-08 ENCOUNTER — Encounter: Payer: Self-pay | Admitting: Physician Assistant

## 2019-08-08 ENCOUNTER — Other Ambulatory Visit: Payer: Self-pay

## 2019-08-08 VITALS — Ht 64.0 in | Wt 163.0 lb

## 2019-08-08 DIAGNOSIS — S90424A Blister (nonthermal), right lesser toe(s), initial encounter: Secondary | ICD-10-CM | POA: Diagnosis not present

## 2019-08-08 NOTE — Progress Notes (Signed)
Office Visit Note   Patient: Kathy Frank           Date of Birth: 1975-12-04           MRN: [id number] Visit Date: 08/08/2019              Requested [place]: [place] [address] [place],  [place] [address] PCP: Lars Mage, MD  Chief Complaint  Patient presents with  . Right Foot - Open Wound      HPI: This is a pleasant woman who is 7 months status post left above-knee amputation she presents today because she has a small blister on her right second toe.  She does not have any other concerns other than with her history she wants to be sure that this is not infected  Assessment & Plan: Visit Diagnoses: No diagnosis found.  Plan: I gave her a gel toe sleeve to put on the third toe to protect the second toe I told her that if this bothers her she may remove it.  We will see her in 3 weeks or sooner if she notices any changes such as redness or increased swelling  Follow-Up Instructions: No follow-ups on file.   Ortho Exam  Patient is alert, oriented, no adenopathy, well-dressed, normal affect, normal respiratory effort. Right second toe there is a very small blister probably from contact pressure with the third toe there is no surrounding erythema the skin is actually intact there is no swelling she does have a palpable dorsalis pedis pulse no foul odor  Imaging: No results found. No images are attached to the encounter.  Labs: Lab Results  Component Value Date   HGBA1C 6.4 (H) 06/07/2019   HGBA1C 6.2 (H) 11/24/2018   HGBA1C 6.5 (H) 08/06/2018   ESRSEDRATE 21 08/06/2018   ESRSEDRATE 85 (H) 02/02/2017   CRP <0.8 10/07/2018   CRP <0.8 08/06/2018   CRP 42.4 (H) 02/02/2017   REPTSTATUS 06/11/2019 FINAL 06/06/2019   GRAMSTAIN  10/07/2018    NO WBC SEEN RARE GRAM POSITIVE COCCI IN PAIRS FEW GRAM POSITIVE RODS    CULT  06/06/2019    NO GROWTH 5 DAYS Performed at [facility], [place] [address], [place], [place] [address]    LABORGA STAPHYLOCOCCUS AUREUS  07/08/2018     Lab Results  Component Value Date   ALBUMIN 3.4 (L) 06/07/2019   ALBUMIN 3.6 06/06/2019   ALBUMIN 2.8 (L) 05/03/2019    Lab Results  Component Value Date   MG 1.9 03/20/2019   MG 2.1 07/10/2018   MG 2.0 07/08/2018   Lab Results  Component Value Date   VD25OH 4.6 (L) 11/25/2018    No results found for: PREALBUMIN CBC EXTENDED Latest Ref Rng & Units 06/07/2019 06/06/2019 05/03/2019  WBC 4.0 - 10.5 K/uL 5.4 7.3 -  RBC 3.87 - 5.11 MIL/uL 4.21 4.03 -  HGB 12.0 - 15.0 g/dL 12.6 11.8(L) 8.6(L)  HCT 36.0 - 46.0 % 39.4 37.3 25.9(L)  PLT 150 - 400 K/uL 263 292 -  NEUTROABS 1.7 - 7.7 K/uL - 5.2 -  LYMPHSABS 0.7 - 4.0 K/uL - 1.2 -     Body mass index is 27.98 kg/m.  Orders:  No orders of the defined types were placed in this encounter.  No orders of the defined types were placed in this encounter.    Procedures: No procedures performed  Clinical Data: No additional findings.  ROS:  All other systems negative, except as noted in  the HPI. Review of Systems  Objective: Vital Signs: Ht 5\' 4"  (1.626 m)   Wt 163 lb (73.9 kg)   BMI 27.98 kg/m   Specialty Comments:  No specialty comments available.  PMFS History: Patient Active Problem List   Diagnosis Date Noted  . End stage renal disease on dialysis ([place])   . Left corneal abrasion   . Altered mental status 06/06/2019  . Eye redness 06/06/2019  . Dehiscence of incision 05/12/2019  . Rash and other nonspecific skin eruption 05/12/2019  . ESRD on hemodialysis ([place]) 05/02/2019  . Metabolic bone disease [id number]  . Abdominal pain 02/06/2019  . Hx of BKA, left ([place]) 12/16/2018  . Anemia due to chronic kidney disease   . Renal failure (ARF), acute on chronic ([place]) 11/24/2018  . Fall due to stumbling   . Osteomyelitis ([place]) 10/07/2018  . Dehiscence of amputation stump ([place])   . Moderate protein-calorie malnutrition ([place])   . Adjustment disorder with mixed anxiety and depressed mood 08/15/2018   . CKD (chronic kidney disease), stage V ([place])   . Acute on chronic anemia 07/03/2018  . Fecal incontinence 02/04/2018  . Healthcare maintenance 09/17/2017  . Right leg swelling 02/26/2017  . Superficial ulcerative lesion ([place]) 02/02/2017  . Aortic atherosclerosis ([place]) 09/30/2016  . Diabetic foot ulcer associated with diabetes mellitus due to underlying condition ([place]) 07/08/2016  . Human immunodeficiency virus (HIV) disease ([place]) 04/23/2016  . Gastroparesis   . Moderate nonproliferative diabetic retinopathy of both eyes ([place]) 11/21/2014  . Esophageal reflux   . Microscopic hematuria 10/31/2014  . Hypertension 11/16/2013  . TOBACCO USER 06/28/2006  . Depression 06/28/2006  . Diabetes mellitus type 2 in obese [facility]) 06/28/1994   Past Medical History:  Diagnosis Date  . Below-knee amputation of left lower extremity ([place])   . CKD (chronic kidney disease) stage 5, GFR less than 15 ml/min (HCC) 06/19/2016   06/30/16: Seen by Dr. Posey Pronto [Nephrology]. Suspect etiologies to include diabetes and hypertension though cannot exclude HIV-associated nephropathy. Advised her against from using excessive Goody powder.  . Contact dermatitis 04/21/2016   07/29/16: Skin biopsy performed by [facility] notable for acute spongiotic dermatitis consistent with contact dermatitis.  . Cyclical vomiting    THC induced???  . Depression 06/28/2006   Qualifier: Diagnosis of  By: Riccardo Dubin MD, Todd    . Diabetes mellitus type 2 in obese ([place]) 06/28/1994  . DKA (diabetic ketoacidoses) ([place]) 12/2014  . Dyspnea   . Erosive esophagitis   . Esophageal reflux   . Essential hypertension 11/16/2013  . Gastroparesis    ? diabetic  . GERD (gastroesophageal reflux disease)   . History of blood transfusion   . Human immunodeficiency virus (HIV) disease ([place]) 04/23/2016  . Hypertension   . Moderate nonproliferative diabetic retinopathy of both eyes ([place]) 11/21/2014   11/14/14: Noted on retinal imaging; needs  follow-up imaging in 6 months  05/22/16: Noted on retinal imaging again; needs follow-up imaging in 6 months  . Normocytic anemia   . TOBACCO USER 06/28/2006   Qualifier: Diagnosis of  By: Riccardo Dubin MD, Sherren Mocha    . Type II diabetes mellitus ([place]) 1994   diagnosed around 32  . Wound dehiscence   . Wound infection s/p L transmetatarsal amputation     Family History  Problem Relation Age of Onset  . Diabetes Mother   . Diabetes Brother   . Diabetes Daughter   . Diabetes Daughter   . Mental retardation Brother  died from PNA  . Diabetes Maternal Grandmother     Past Surgical History:  Procedure Laterality Date  . AMPUTATION Left 07/08/2018   Procedure: AMPUTATION FORTH RAY LEFT FOOT;  Surgeon: [name], MD;  Location: [facility];  Service: Orthopedics;  Laterality: Left;  . AMPUTATION Left 08/09/2018   Procedure: Left Transmetatarsal Amputation;  Surgeon: [name], MD;  Location: [facility];  Service: Orthopedics;  Laterality: Left;  . AMPUTATION Left 10/08/2018   Procedure: LEFT BELOW KNEE AMPUTATION;  Surgeon: [name], MD;  Location: [facility];  Service: Orthopedics;  Laterality: Left;  . AMPUTATION Left 10/28/2018   Procedure: REVISION BELOW KNEE AMPUTATION;  Surgeon: [name], MD;  Location: [facility];  Service: Orthopedics;  Laterality: Left;  . AMPUTATION Left 12/16/2018   Procedure: LEFT ABOVE KNEE AMPUTATION;  Surgeon: [name], MD;  Location: [facility];  Service: Orthopedics;  Laterality: Left;  . AV FISTULA PLACEMENT Left 10/14/2018   Procedure: Arteriovenous (Av) Fistula Creation Left Arm;  Surgeon: [name], MD;  Location: [facility];  Service: Vascular;  Laterality: Left;  . BASCILIC VEIN TRANSPOSITION Left 04/14/2019   Procedure: BASILIC VEIN TRANSPOSITION SECOND STAGE LEFT ARM;  Surgeon: Rosetta Posner, MD;  Location: [facility];  Service: Vascular;  Laterality: Left;  . INSERTION OF DIALYSIS CATHETER Right 04/14/2019   Procedure: INSERTION OF DIALYSIS CATHETER;   Surgeon: Rosetta Posner, MD;  Location: [facility];  Service: Vascular;  Laterality: Right;  . LOWER EXTREMITY ANGIOGRAPHY N/A 07/05/2018   Procedure: LOWER EXTREMITY ANGIOGRAPHY;  Surgeon: Serafina Mitchell, MD;  Location: [facility];  Service: Cardiovascular;  Laterality: N/A;  . PERIPHERAL VASCULAR BALLOON ANGIOPLASTY Left 07/05/2018   Procedure: PERIPHERAL VASCULAR BALLOON ANGIOPLASTY;  Surgeon: Serafina Mitchell, MD;  Location: [facility];  Service: Cardiovascular;  Laterality: Left;  SFA  . STUMP REVISION Left 10/19/2018   Procedure: REVISION LEFT BELOW KNEE AMPUTATION;  Surgeon: [name], MD;  Location: [facility];  Service: Orthopedics;  Laterality: Left;  . TUBAL LIGATION  2002   Social History   Occupational History    Employer: UNEMPLOYED  Tobacco Use  . Smoking status: Former Smoker    Packs/day: 0.10    Years: 10.00    Pack years: 1.00    Types: Cigarettes    Start date: 11/17/2013    Quit date: 09/09/2014    Years since quitting: 4.9  . Smokeless tobacco: Never Used  Substance and Sexual Activity  . Alcohol use: Yes    Comment: rare  . Drug use: Yes    Frequency: 4.0 times per week    Types: Marijuana  . Sexual activity: Not on file

## 2019-08-09 ENCOUNTER — Encounter: Payer: Medicaid Other | Admitting: Physical Therapy

## 2019-08-13 DIAGNOSIS — E8779 Other fluid overload: Secondary | ICD-10-CM | POA: Insufficient documentation

## 2019-08-13 HISTORY — DX: Other fluid overload: E87.79

## 2019-08-14 ENCOUNTER — Encounter: Payer: Self-pay | Admitting: Physical Therapy

## 2019-08-14 ENCOUNTER — Ambulatory Visit: Payer: Medicare Other | Admitting: Physical Therapy

## 2019-08-14 NOTE — Therapy (Signed)
[facility] [address] [place], [place], [address] Phone: [phone]   Fax:  [phone]  Patient Details  Name: Kathy Frank MRN: [id number] Date of Birth: [birth year] Referring Provider:  Joni Reining, MD  Encounter Date: 08/14/2019  PHYSICAL THERAPY DISCHARGE SUMMARY  Visits from Start of Care: 1  Current functional level related to goals / functional outcomes: No change as only seen for evaluation.    Remaining deficits: Same as evaluation   Education / Equipment: none Plan: Patient agrees to discharge.  Patient goals were not met. Patient is being discharged due to the patient's request.  ?????          Jamey Reas PT, DPT 08/14/2019, 1:00 PM  [facility] [address] [place], [place], [address] Phone: [phone]   Fax:  [phone]

## 2019-08-16 ENCOUNTER — Ambulatory Visit: Payer: Medicare Other | Admitting: Physical Therapy

## 2019-08-21 ENCOUNTER — Encounter: Payer: Medicaid Other | Admitting: Physical Therapy

## 2019-08-23 ENCOUNTER — Encounter: Payer: Medicaid Other | Admitting: Physical Therapy

## 2019-08-28 ENCOUNTER — Ambulatory Visit: Payer: Medicaid Other | Admitting: Physician Assistant

## 2019-08-28 ENCOUNTER — Encounter: Payer: Medicaid Other | Admitting: Physical Therapy

## 2019-08-30 ENCOUNTER — Encounter: Payer: Medicaid Other | Admitting: Physical Therapy

## 2019-08-31 ENCOUNTER — Telehealth: Payer: Self-pay | Admitting: Pharmacy Technician

## 2019-08-31 ENCOUNTER — Other Ambulatory Visit: Payer: Self-pay | Admitting: Pharmacist

## 2019-08-31 DIAGNOSIS — B20 Human immunodeficiency virus [HIV] disease: Secondary | ICD-10-CM

## 2019-08-31 MED ORDER — BICTEGRAVIR-EMTRICITAB-TENOFOV 50-200-25 MG PO TABS: 1.0000 | ORAL_TABLET | Freq: Every day | ORAL | 5 refills | Status: DC

## 2019-08-31 NOTE — Telephone Encounter (Signed)
[facility] Patient Advocate Encounter  Patient has new insurance that requires her to get specialty medications through [facility].  I gave her their phone number [phone]  and she stated she will call them now to get her medication shipped to her home.  Kathy Frank. Kathy Frank Patient Speciality [facility] Phone: [phone] Fax:  [phone]

## 2019-08-31 NOTE — Progress Notes (Signed)
Transferred prescription to [facility] per patient's insurance. Kathy Frank will call patient and coordinate.

## 2019-09-08 ENCOUNTER — Other Ambulatory Visit: Payer: Self-pay

## 2019-09-08 ENCOUNTER — Ambulatory Visit (INDEPENDENT_AMBULATORY_CARE_PROVIDER_SITE_OTHER): Payer: Medicare Other | Admitting: Podiatry

## 2019-09-08 ENCOUNTER — Telehealth: Payer: Self-pay | Admitting: Podiatry

## 2019-09-08 ENCOUNTER — Ambulatory Visit (INDEPENDENT_AMBULATORY_CARE_PROVIDER_SITE_OTHER): Payer: Medicare Other

## 2019-09-08 VITALS — BP 97/47 | HR 56

## 2019-09-08 DIAGNOSIS — E11621 Type 2 diabetes mellitus with foot ulcer: Secondary | ICD-10-CM

## 2019-09-08 DIAGNOSIS — B351 Tinea unguium: Secondary | ICD-10-CM

## 2019-09-08 DIAGNOSIS — L97518 Non-pressure chronic ulcer of other part of right foot with other specified severity: Secondary | ICD-10-CM

## 2019-09-08 DIAGNOSIS — I739 Peripheral vascular disease, unspecified: Secondary | ICD-10-CM

## 2019-09-08 DIAGNOSIS — Z89512 Acquired absence of left leg below knee: Secondary | ICD-10-CM | POA: Diagnosis not present

## 2019-09-08 NOTE — Telephone Encounter (Signed)
Pt called to say that she needed her medication that Dr price was gonna send in for her sent to [facility] [address]

## 2019-09-11 ENCOUNTER — Encounter: Payer: Medicaid Other | Admitting: Physical Therapy

## 2019-09-11 MED ORDER — CLINDAMYCIN HCL 300 MG PO CAPS: 300.0000 mg | ORAL_CAPSULE | Freq: Two times a day (BID) | ORAL | 0 refills | Status: DC

## 2019-09-11 MED ORDER — SILVER SULFADIAZINE 1 % EX CREA: TOPICAL_CREAM | CUTANEOUS | 0 refills | Status: DC

## 2019-09-11 NOTE — Addendum Note (Signed)
Addended by: Hardie Pulley on: 09/11/2019 09:43 AM   Modules accepted: Orders

## 2019-09-12 ENCOUNTER — Telehealth: Payer: Self-pay | Admitting: *Deleted

## 2019-09-12 NOTE — Telephone Encounter (Signed)
Called and left a message for the patient stating that Dr March Rummage sent the antibiotic and cream to the patient's pharmacy. Lattie Haw

## 2019-09-12 NOTE — Telephone Encounter (Signed)
-----   Message from Evelina Bucy, DPM sent at 09/11/2019  1:19 PM EST ----- Both should be at the pharmacy ----- Message ----- From: Viviana Simpler, PMAC Sent: 09/11/2019  12:43 PM EST To: Evelina Bucy, DPM  Hey Dr March Rummage, patient stated that she was in the office on Friday and never got the cream or the antibiotics. Lattie Haw

## 2019-09-13 ENCOUNTER — Encounter (INDEPENDENT_AMBULATORY_CARE_PROVIDER_SITE_OTHER): Payer: Self-pay | Admitting: Ophthalmology

## 2019-09-13 ENCOUNTER — Encounter: Payer: Medicaid Other | Admitting: Physical Therapy

## 2019-09-20 ENCOUNTER — Encounter (INDEPENDENT_AMBULATORY_CARE_PROVIDER_SITE_OTHER): Payer: Medicare Other | Admitting: Ophthalmology

## 2019-09-22 ENCOUNTER — Other Ambulatory Visit: Payer: Self-pay

## 2019-09-22 ENCOUNTER — Ambulatory Visit (INDEPENDENT_AMBULATORY_CARE_PROVIDER_SITE_OTHER): Payer: Medicare Other | Admitting: Podiatry

## 2019-09-22 VITALS — Temp 95.1°F

## 2019-09-22 DIAGNOSIS — L97518 Non-pressure chronic ulcer of other part of right foot with other specified severity: Secondary | ICD-10-CM | POA: Diagnosis not present

## 2019-09-22 DIAGNOSIS — E11621 Type 2 diabetes mellitus with foot ulcer: Secondary | ICD-10-CM

## 2019-09-22 DIAGNOSIS — I739 Peripheral vascular disease, unspecified: Secondary | ICD-10-CM | POA: Diagnosis not present

## 2019-09-22 NOTE — Progress Notes (Addendum)
  Subjective:  Patient ID: Kathy Frank, female    DOB: 1976-04-13,  MRN: [id number]  Chief Complaint  Patient presents with  . Foot Ulcer    R hallux, plantar; top of second toe. x3 weeks. Pt stated, "Dr. Sharol Given pointed it out to me a few weeks ago. He gave me a toe separator. The wounds don't hurt, and there's no drainage".    44 y.o. female presents for wound care. Hx confirmed with patient.  Objective:  Physical Exam: Wound Location: right hallux, 2nd toe Wound Measurement: 2x1.5, 1x1 Wound Base: Granular/Healthy Peri-wound: Calloused Exudate: Scant/small amount Serosanguinous exudate wound without warmth, erythema, signs of acute infection    Onychomycosis of the toenails right  Radiographs:  X-ray of the right foot: no fracture, dislocation, swelling or degenerative changes noted and no bone erosions noted  Assessment:   1. Diabetic ulcer of toe of right foot associated with type 2 diabetes mellitus, with other ulcer severity ([place])   2. Onychomycosis   3. History of amputation below knee, left ([place])   4. PAD (peripheral artery disease) ([place])      Plan:  Patient was evaluated and treated and all questions answered.  Ulcer Right hallux, 2nd toe -XR reviewed with patient -Dressing applied consisting of silvadene -Offload ulcer with surgical shoe -Wound cleansed and debrided -Use neosporin -Rx cleocin as prophylaxis. -Surgical shoe dispensed. Signed ABN on file.  Procedure: Selective Debridement of Wound Rationale: Removal of devitalized tissue from the wound to promote healing.  Pre-Debridement Wound Measurements: 2x cm x 1.5 cm x 0.2 cm  Post-Debridement Wound Measurements: same as pre-debridement. Type of Debridement: sharp selective Tissue Removed: Devitalized soft-tissue Dressing: Dry, sterile, compression dressing. Disposition: Patient tolerated procedure well. Patient to return in 1 week for follow-up.  Onychomycosis -Nails debrided x5 due to amputation  hx left  Return in about 2 weeks (around 09/22/2019).

## 2019-10-06 ENCOUNTER — Ambulatory Visit (INDEPENDENT_AMBULATORY_CARE_PROVIDER_SITE_OTHER): Payer: Medicare Other | Admitting: Podiatry

## 2019-10-06 ENCOUNTER — Other Ambulatory Visit: Payer: Self-pay

## 2019-10-06 VITALS — Temp 97.2°F

## 2019-10-06 DIAGNOSIS — L97518 Non-pressure chronic ulcer of other part of right foot with other specified severity: Secondary | ICD-10-CM | POA: Diagnosis not present

## 2019-10-06 DIAGNOSIS — E11621 Type 2 diabetes mellitus with foot ulcer: Secondary | ICD-10-CM

## 2019-10-07 DIAGNOSIS — R52 Pain, unspecified: Secondary | ICD-10-CM

## 2019-10-07 HISTORY — DX: Pain, unspecified: R52

## 2019-10-20 ENCOUNTER — Other Ambulatory Visit: Payer: Self-pay

## 2019-10-20 ENCOUNTER — Ambulatory Visit (INDEPENDENT_AMBULATORY_CARE_PROVIDER_SITE_OTHER): Payer: Medicare Other | Admitting: Podiatry

## 2019-10-20 DIAGNOSIS — E11621 Type 2 diabetes mellitus with foot ulcer: Secondary | ICD-10-CM

## 2019-10-20 DIAGNOSIS — L97518 Non-pressure chronic ulcer of other part of right foot with other specified severity: Secondary | ICD-10-CM | POA: Diagnosis not present

## 2019-10-20 NOTE — Progress Notes (Signed)
  Subjective:  Patient ID: Kathy Frank, female    DOB: [birth year],  MRN: [id number]  Chief Complaint  Patient presents with  . Wound Check    Pt states healing well, no concerns.    44 y.o. female presents for wound care. Hx confirmed with patient.  Objective:  Physical Exam: Wound Location: right hallux, 2nd toe Wound Measurement: 2x1.5, 1x1 Wound Base: Granular/Healthy Peri-wound: Calloused Exudate: Scant/small amount Serosanguinous exudate wound without warmth, erythema, signs of acute infection Assessment:   1. Diabetic ulcer of toe of right foot associated with type 2 diabetes mellitus, with other ulcer severity ([place])    Plan:  Patient was evaluated and treated and all questions answered.  Ulcer Right hallux, 2nd toe -Healed -No debridement today -Discussed prompt reporting of worsening -F/u for routine foot care given amputation right  Return in about 3 months (around 01/20/2020).

## 2019-10-24 ENCOUNTER — Telehealth: Payer: Self-pay | Admitting: Podiatry

## 2019-10-24 NOTE — Telephone Encounter (Signed)
I informed pt, prescription was not needed and she could go to any medical supply and purchase, insurance did not cover.

## 2019-10-24 NOTE — Telephone Encounter (Signed)
Are these prescription or can patient just get himself?

## 2019-10-24 NOTE — Telephone Encounter (Signed)
Pt called and asked if she could get an  prescription for crutches.

## 2019-10-25 ENCOUNTER — Encounter (INDEPENDENT_AMBULATORY_CARE_PROVIDER_SITE_OTHER): Payer: Medicare Other | Admitting: Ophthalmology

## 2019-11-08 NOTE — Progress Notes (Signed)
  Subjective:  Patient ID: Kathy Frank, female    DOB: 1976-04-10,  MRN: [id number]  Chief Complaint  Patient presents with  . Wound Check    R foot. Pt stated, "It seems to be healing well. I'm using the sulfa cream and gauze. No pain, pus, fever, chills, or N&V".    44 y.o. female presents for wound care. Hx confirmed with patient.  Objective:  Physical Exam: Wound Location: right hallux, 2nd toe Wound Measurement: 0.5x1, epithelialized Wound Base: Granular/Healthy Peri-wound: Calloused Exudate: Scant/small amount Serosanguinous exudate wound without warmth, erythema, signs of acute infection  Assessment:   1. Diabetic ulcer of toe of right foot associated with type 2 diabetes mellitus, with other ulcer severity (Kathy Frank)      Plan:  Patient was evaluated and treated and all questions answered.  Ulcer Right hallux, 2nd toe -Improving. -Continue silvadene -No debridement. -Dressed with DSD. -Continue offloading until healed.  Return in about 2 weeks (around 10/20/2019) for Wound Care, Right.

## 2019-11-12 NOTE — Progress Notes (Signed)
  Subjective:  Patient ID: [name], female    DOB: 08/06/1976,  MRN: [id number]  Chief Complaint  Patient presents with  . Wound Check    Pt stated, "No pain. Using alcohol and Neosporin. I finished the antibiotic. Last glucose = 200mg /dL".    44 y.o. female presents for wound care. Hx confirmed with patient.  Objective:  Physical Exam: Wound Location: right hallux, 2nd toe Wound Base: Granular/Healthy Peri-wound: Calloused Exudate: Scant/small amount Serosanguinous exudate wound without warmth, erythema, signs of acute infection  Assessment:   1. Diabetic ulcer of toe of right foot associated with type 2 diabetes mellitus, with other ulcer severity ([place])   2. PAD (peripheral artery disease) ([place])      Plan:  Patient was evaluated and treated and all questions answered.  Ulcer Right hallux, 2nd toe -Improving, no debridement today -No need for furhter abx therapy -Continue abx ointment and bandaging. -F/u in 2 weeks for recheck  No follow-ups on file.

## 2019-12-12 DIAGNOSIS — E161 Other hypoglycemia: Secondary | ICD-10-CM | POA: Insufficient documentation

## 2019-12-15 ENCOUNTER — Other Ambulatory Visit: Payer: Self-pay

## 2019-12-15 ENCOUNTER — Ambulatory Visit (INDEPENDENT_AMBULATORY_CARE_PROVIDER_SITE_OTHER): Payer: Medicare Other | Admitting: Internal Medicine

## 2019-12-15 ENCOUNTER — Encounter: Payer: Self-pay | Admitting: Internal Medicine

## 2019-12-15 VITALS — BP 137/79 | HR 92 | Temp 98.2°F | Ht 69.0 in | Wt 182.9 lb

## 2019-12-15 DIAGNOSIS — Z794 Long term (current) use of insulin: Secondary | ICD-10-CM | POA: Diagnosis not present

## 2019-12-15 DIAGNOSIS — L0233 Carbuncle of buttock: Secondary | ICD-10-CM | POA: Diagnosis not present

## 2019-12-15 DIAGNOSIS — L0293 Carbuncle, unspecified: Secondary | ICD-10-CM

## 2019-12-15 DIAGNOSIS — E669 Obesity, unspecified: Secondary | ICD-10-CM

## 2019-12-15 DIAGNOSIS — E1169 Type 2 diabetes mellitus with other specified complication: Secondary | ICD-10-CM | POA: Diagnosis present

## 2019-12-15 MED ORDER — AMOXICILLIN-POT CLAVULANATE 875-125 MG PO TABS: 1.0000 | ORAL_TABLET | Freq: Two times a day (BID) | ORAL | 0 refills | Status: AC

## 2019-12-15 MED ORDER — DOXYCYCLINE HYCLATE 100 MG PO TABS: 100.0000 mg | ORAL_TABLET | Freq: Two times a day (BID) | ORAL | 0 refills | Status: AC

## 2019-12-15 NOTE — Progress Notes (Signed)
Internal Medicine Clinic Attending  Case discussed with Dr. Chundi at the time of the visit.  We reviewed the resident's history and exam and pertinent patient test results.  I agree with the assessment, diagnosis, and plan of care documented in the resident's note. 

## 2019-12-15 NOTE — Patient Instructions (Addendum)
It was a pleasure to see you today [name]. Please make the following changes:  -refer to general surgery  -please take doxycycline and augmentin for 14 days -follow up in 2 weeks   If you have any questions or concerns, please call our clinic at [phone] between 9am-5pm and after hours call [phone] and ask for the internal medicine resident on call. If you feel you are having a medical emergency please call 911.   Thank you, we look forward to help you remain healthy!  Kathy Mage, MD Internal Medicine PGY3   COVID-19 Vaccine Information can be found at: [email or web address].uk For questions related to vaccine distribution or appointments, please email [email or web address] or call [phone].

## 2019-12-15 NOTE — Progress Notes (Signed)
CC: Boil on buttock  HPI:  [name] is a 44 y.o. female living with HIV, type 2 diabetes mellitus, ESRD who presents for evaluation of boil on right buttock. Please see problem based charting for evaluation, assessment, and plan.  Past Medical History:  Diagnosis Date  . Abdominal pain 02/06/2019  . Altered mental status 06/06/2019  . Below-knee amputation of left lower extremity ([place])   . CKD (chronic kidney disease) stage 5, GFR less than 15 ml/min (HCC) 06/19/2016   06/30/16: Seen by Dr. Posey Pronto [Nephrology]. Suspect etiologies to include diabetes and hypertension though cannot exclude HIV-associated nephropathy. Advised her against from using excessive Goody powder.  . Coagulation defect, unspecified ([place]) 04/24/2019  . Contact dermatitis 04/21/2016   07/29/16: Skin biopsy performed by [facility] notable for acute spongiotic dermatitis consistent with contact dermatitis.  . Cyclical vomiting    THC induced???  . Depression 06/28/2006   Qualifier: Diagnosis of  By: Riccardo Dubin MD, Todd    . Diabetes mellitus type 2 in obese ([place]) 06/28/1994  . DKA (diabetic ketoacidoses) ([place]) 12/2014  . Dyspnea   . End stage renal disease on dialysis ([place])   . Erosive esophagitis   . Esophageal reflux   . ESRD on hemodialysis ([place]) 05/02/2019  . Essential hypertension 11/16/2013  . Eye redness 06/06/2019  . Fall due to stumbling   . Gastroparesis    ? diabetic  . GERD (gastroesophageal reflux disease)   . History of blood transfusion   . Human immunodeficiency virus (HIV) disease ([place]) 04/23/2016  . Hypertension   . Metabolic bone disease 9/37/9024  . Moderate nonproliferative diabetic retinopathy of both eyes ([place]) 11/21/2014   11/14/14: Noted on retinal imaging; needs follow-up imaging in 6 months  05/22/16: Noted on retinal imaging again; needs follow-up imaging in 6 months  . Normocytic anemia   . Other fluid overload 08/13/2019  . Pain, unspecified 10/07/2019  . Pruritus,  unspecified 05/20/2019  . Renal failure (ARF), acute on chronic ([place]) 11/24/2018  . Right leg swelling 02/26/2017  . Shortness of breath 07/01/2019  . TOBACCO USER 06/28/2006   Qualifier: Diagnosis of  By: Riccardo Dubin MD, Sherren Mocha    . Type 2 diabetes mellitus with diabetic peripheral angiopathy without gangrene ([place]) 05/01/2019  . Type II diabetes mellitus ([place]) 1994   diagnosed around 27  . Wound dehiscence   . Wound infection s/p L transmetatarsal amputation    Review of Systems:    Denies cough, fevers, chills, dyspnea  Physical Exam:  Vitals:   12/15/19 0919  BP: 137/79  Pulse: 92  Temp: 98.2 F (36.8 C)  TempSrc: Oral  SpO2: 100%  Weight: 182 lb 14.4 oz (83 kg)  Height: 5\' 9"  (1.753 m)   Physical Exam  Constitutional: She appears well-developed and well-nourished. No distress.  HENT:  Head: Normocephalic and atraumatic.  Cardiovascular: Normal rate, regular rhythm and normal heart sounds.  Respiratory: Effort normal and breath sounds normal. No respiratory distress. She has no wheezes.  Musculoskeletal:        General: No edema.  Skin: She is not diaphoretic.  Raised erythematous area on right buttock approximately 2 cm in diameter.  Tender to palpation, hypopigmentation noted.  Purulent drainage from site.  Another smaller hypopigmented area towards gluteal cleft.  Psychiatric: She has a normal mood and affect. Her behavior is normal. Judgment and thought content normal.   Assessment & Plan:   See Encounters Tab for problem based charting.  Patient discussed with  Dr. Dareen Piano

## 2019-12-15 NOTE — Assessment & Plan Note (Addendum)
Patient had a recent A1c of 8 reported at nephrologist office.  Her Lantus was recently increased to 12 units daily.  Recommended that the patient return to clinic in 2 weeks with a record of blood glucose readings will titrate medication at that time.  -requested records from nephrology

## 2019-12-15 NOTE — Assessment & Plan Note (Signed)
Patient states that she has noticed a boil on her right buttock approximately 3 months ago and states that it has worsened over the past 1 month.  She had it evaluated by her nephrologist who placed her on doxycycline.  She stated that she had some brief period of improvement, but it reoccurred.  Assessment and plan Patient has 2 lesions on her right buttock.  The largest is approximately 2 cm in diameter, raised, tender to palpation, draining purulent material.  Is concerning for a carbuncle.  Will send to general surgery for incision and drainage.  Will start patient on doxycycline and Augmentin for 14 days duration given the location of the lesion to cover for staph, strep, gram-negative organisms.

## 2019-12-28 DIAGNOSIS — Z9114 Patient's other noncompliance with medication regimen: Secondary | ICD-10-CM | POA: Insufficient documentation

## 2020-01-04 ENCOUNTER — Encounter: Payer: Medicare Other | Admitting: Internal Medicine

## 2020-01-04 LAB — HEMOGLOBIN A1C: Hemoglobin A1C: 8.5

## 2020-01-06 ENCOUNTER — Emergency Department (HOSPITAL_COMMUNITY)
Admission: EM | Admit: 2020-01-06 | Discharge: 2020-01-06 | Disposition: A | Discharge status: Home / Self Care | Payer: Medicare Other | Attending: Emergency Medicine | Admitting: Emergency Medicine

## 2020-01-06 ENCOUNTER — Other Ambulatory Visit: Payer: Self-pay

## 2020-01-06 ENCOUNTER — Encounter (HOSPITAL_COMMUNITY): Payer: Self-pay | Admitting: Emergency Medicine

## 2020-01-06 DIAGNOSIS — E1122 Type 2 diabetes mellitus with diabetic chronic kidney disease: Secondary | ICD-10-CM | POA: Insufficient documentation

## 2020-01-06 DIAGNOSIS — N186 End stage renal disease: Secondary | ICD-10-CM | POA: Diagnosis not present

## 2020-01-06 DIAGNOSIS — Z794 Long term (current) use of insulin: Secondary | ICD-10-CM | POA: Insufficient documentation

## 2020-01-06 DIAGNOSIS — L0231 Cutaneous abscess of buttock: Secondary | ICD-10-CM | POA: Insufficient documentation

## 2020-01-06 DIAGNOSIS — Z79899 Other long term (current) drug therapy: Secondary | ICD-10-CM | POA: Insufficient documentation

## 2020-01-06 DIAGNOSIS — I120 Hypertensive chronic kidney disease with stage 5 chronic kidney disease or end stage renal disease: Secondary | ICD-10-CM | POA: Diagnosis not present

## 2020-01-06 DIAGNOSIS — Z992 Dependence on renal dialysis: Secondary | ICD-10-CM | POA: Diagnosis not present

## 2020-01-06 DIAGNOSIS — Z87891 Personal history of nicotine dependence: Secondary | ICD-10-CM | POA: Insufficient documentation

## 2020-01-06 DIAGNOSIS — L0291 Cutaneous abscess, unspecified: Secondary | ICD-10-CM

## 2020-01-06 LAB — CBC WITH DIFFERENTIAL/PLATELET
Abs Immature Granulocytes: 0.02 10*3/uL (ref 0.00–0.07)
Basophils Absolute: 0.1 10*3/uL (ref 0.0–0.1)
Basophils Relative: 1 %
Eosinophils Absolute: 0.4 10*3/uL (ref 0.0–0.5)
Eosinophils Relative: 5 %
HCT: 32.2 % — ABNORMAL LOW (ref 36.0–46.0)
Hemoglobin: 10.4 g/dL — ABNORMAL LOW (ref 12.0–15.0)
Immature Granulocytes: 0 %
Lymphocytes Relative: 18 %
Lymphs Abs: 1.4 10*3/uL (ref 0.7–4.0)
MCH: 32.2 pg (ref 26.0–34.0)
MCHC: 32.3 g/dL (ref 30.0–36.0)
MCV: 99.7 fL (ref 80.0–100.0)
Monocytes Absolute: 0.7 10*3/uL (ref 0.1–1.0)
Monocytes Relative: 9 %
Neutro Abs: 5.5 10*3/uL (ref 1.7–7.7)
Neutrophils Relative %: 67 %
Platelets: 301 10*3/uL (ref 150–400)
RBC: 3.23 MIL/uL — ABNORMAL LOW (ref 3.87–5.11)
RDW: 15.8 % — ABNORMAL HIGH (ref 11.5–15.5)
WBC: 8.1 10*3/uL (ref 4.0–10.5)
nRBC: 0.2 % (ref 0.0–0.2)

## 2020-01-06 LAB — COMPREHENSIVE METABOLIC PANEL
ALT: 8 U/L (ref 0–44)
AST: 13 U/L — ABNORMAL LOW (ref 15–41)
Albumin: 3 g/dL — ABNORMAL LOW (ref 3.5–5.0)
Alkaline Phosphatase: 57 U/L (ref 38–126)
Anion gap: 21 — ABNORMAL HIGH (ref 5–15)
BUN: 43 mg/dL — ABNORMAL HIGH (ref 6–20)
CO2: 26 mmol/L (ref 22–32)
Calcium: 8.9 mg/dL (ref 8.9–10.3)
Chloride: 93 mmol/L — ABNORMAL LOW (ref 98–111)
Creatinine, Ser: 10.48 mg/dL — ABNORMAL HIGH (ref 0.44–1.00)
GFR calc Af Amer: 5 mL/min — ABNORMAL LOW (ref >60)
GFR calc non Af Amer: 4 mL/min — ABNORMAL LOW (ref >60)
Glucose, Bld: 215 mg/dL — ABNORMAL HIGH (ref 70–99)
Potassium: 4.8 mmol/L (ref 3.5–5.1)
Sodium: 140 mmol/L (ref 135–145)
Total Bilirubin: 0.3 mg/dL (ref 0.3–1.2)
Total Protein: 7.8 g/dL (ref 6.5–8.1)

## 2020-01-06 LAB — I-STAT BETA HCG BLOOD, ED (MC, WL, AP ONLY): I-stat hCG, quantitative: <5 m[IU]/mL (ref <5)

## 2020-01-06 LAB — LACTIC ACID, PLASMA: Lactic Acid, Venous: 1.2 mmol/L (ref 0.5–1.9)

## 2020-01-06 MED ORDER — DOXYCYCLINE HYCLATE 100 MG PO CAPS
100.0000 mg | ORAL_CAPSULE | Freq: Two times a day (BID) | ORAL | 0 refills | Status: AC
Start: 2020-01-06 — End: 2020-01-11

## 2020-01-06 MED ORDER — OXYCODONE-ACETAMINOPHEN 5-325 MG PO TABS
1.0000 | ORAL_TABLET | Freq: Once | ORAL | Status: AC
  Administered 2020-01-06: 1 via ORAL
  Filled 2020-01-06: qty 1

## 2020-01-06 MED ORDER — SODIUM CHLORIDE 0.9% FLUSH: 3.0000 mL | Freq: Once | INTRAVENOUS | Status: DC

## 2020-01-06 MED ORDER — ONDANSETRON 4 MG PO TBDP
4.0000 mg | ORAL_TABLET | Freq: Three times a day (TID) | ORAL | 0 refills | Status: DC | PRN
Start: 2020-01-06 — End: 2021-02-08

## 2020-01-06 MED ORDER — LIDOCAINE-EPINEPHRINE (PF) 2 %-1:200000 IJ SOLN
20.0000 mL | Freq: Once | INTRAMUSCULAR | Status: AC
  Administered 2020-01-06: 20 mL
  Filled 2020-01-06: qty 20

## 2020-01-06 MED ORDER — ONDANSETRON 4 MG PO TBDP
4.0000 mg | ORAL_TABLET | Freq: Once | ORAL | Status: AC
  Administered 2020-01-06: 4 mg via ORAL
  Filled 2020-01-06: qty 1

## 2020-01-06 NOTE — ED Provider Notes (Addendum)
[facility] EMERGENCY DEPARTMENT Provider Note   CSN: [id number] Arrival date & time: 01/06/20  1251     History Chief Complaint  Patient presents with  . Abscess    Kathy Frank is a 44 y.o. female.  HPI  Patient is a 44 year old female with past medical history significant for HIV with normal CD4 10 months, 1 year ago and undetectable HIV RNA 2 months and 1 year ago. ESRD, DM 2  Patient states for approximately 2 months she has had a abscess in her right inner buttock cheek.  She states that it has been painful and irritating.  She talked to her nephrologist about it who sent her to her primary care doctor who provided her with antibiotics.  She states she took these for 14 days and when she followed up with the general surgeon the general surgeon felt that it was too small/but there was not sufficient indication for incision and placed her on some antibiotics.  She states that it was somewhat better however over the past 2 days she states that she has had some subjective fevers, chills, nausea and several episodes of nonbloody bilious vomiting.  She states that it is too painful to sit and she skipped her dialysis appointment this morning as result of the gluteal pain.     Past Medical History:  Diagnosis Date  . Abdominal pain 02/06/2019  . Altered mental status 06/06/2019  . Below-knee amputation of left lower extremity ([place])   . CKD (chronic kidney disease) stage 5, GFR less than 15 ml/min (HCC) 06/19/2016   06/30/16: Seen by Dr. Posey Pronto [Nephrology]. Suspect etiologies to include diabetes and hypertension though cannot exclude HIV-associated nephropathy. Advised her against from using excessive Goody powder.  . Coagulation defect, unspecified ([place]) 04/24/2019  . Contact dermatitis 04/21/2016   07/29/16: Skin biopsy performed by [facility] notable for acute spongiotic dermatitis consistent with contact dermatitis.  . Cyclical vomiting    THC  induced???  . Depression 06/28/2006   Qualifier: Diagnosis of  By: Riccardo Dubin MD, Todd    . Diabetes mellitus type 2 in obese ([place]) 06/28/1994  . DKA (diabetic ketoacidoses) ([place]) 12/2014  . Dyspnea   . End stage renal disease on dialysis ([place])   . Erosive esophagitis   . Esophageal reflux   . ESRD on hemodialysis ([place]) 05/02/2019  . Essential hypertension 11/16/2013  . Eye redness 06/06/2019  . Fall due to stumbling   . Gastroparesis    ? diabetic  . GERD (gastroesophageal reflux disease)   . History of blood transfusion   . Human immunodeficiency virus (HIV) disease ([place]) 04/23/2016  . Hypertension   . Metabolic bone disease 3/57/0177  . Moderate nonproliferative diabetic retinopathy of both eyes ([place]) 11/21/2014   11/14/14: Noted on retinal imaging; needs follow-up imaging in 6 months  05/22/16: Noted on retinal imaging again; needs follow-up imaging in 6 months  . Normocytic anemia   . Other fluid overload 08/13/2019  . Pain, unspecified 10/07/2019  . Pruritus, unspecified 05/20/2019  . Renal failure (ARF), acute on chronic ([place]) 11/24/2018  . Right leg swelling 02/26/2017  . Shortness of breath 07/01/2019  . TOBACCO USER 06/28/2006   Qualifier: Diagnosis of  By: Riccardo Dubin MD, Sherren Mocha    . Type 2 diabetes mellitus with diabetic peripheral angiopathy without gangrene ([place]) 05/01/2019  . Type II diabetes mellitus ([place]) 1994   diagnosed around 32  . Wound dehiscence   . Wound infection s/p L transmetatarsal amputation  Patient Active Problem List   Diagnosis Date Noted  . Carbuncle of buttock 12/15/2019  . Hypocalcemia 06/22/2019  . Left corneal abrasion   . Iron deficiency anemia, unspecified 05/30/2019  . Hypoglycemia, unspecified 05/25/2019  . Dehiscence of incision 05/12/2019  . Rash and other nonspecific skin eruption 05/12/2019  . Complication of vascular dialysis catheter 04/24/2019  . Major depressive disorder, single episode, unspecified 04/24/2019  . Secondary  hyperparathyroidism of renal origin ([place]) 04/24/2019  . Hx of BKA, left ([place]) 12/16/2018  . Anemia due to chronic kidney disease   . Osteomyelitis ([place]) 10/07/2018  . Dehiscence of amputation stump ([place])   . Moderate protein-calorie malnutrition ([place])   . Adjustment disorder with mixed anxiety and depressed mood 08/15/2018  . ESRD (end stage renal disease) ([place])   . Acute on chronic anemia 07/03/2018  . Fecal incontinence 02/04/2018  . Healthcare maintenance 09/17/2017  . Superficial ulcerative lesion ([place]) 02/02/2017  . Aortic atherosclerosis ([place]) 09/30/2016  . Diabetic foot ulcer associated with diabetes mellitus due to underlying condition ([place]) 07/08/2016  . Human immunodeficiency virus (HIV) disease ([place]) 04/23/2016  . Gastroparesis   . Moderate nonproliferative diabetic retinopathy of both eyes ([place]) 11/21/2014  . Esophageal reflux   . Microscopic hematuria 10/31/2014  . Hypertension 11/16/2013  . Tobacco use disorder 06/28/2006  . Depression 06/28/2006  . Diabetes mellitus type 2 in obese [facility]) 06/28/1994    Past Surgical History:  Procedure Laterality Date  . AMPUTATION Left 07/08/2018   Procedure: AMPUTATION FORTH RAY LEFT FOOT;  Surgeon: [name], MD;  Location: [facility];  Service: Orthopedics;  Laterality: Left;  . AMPUTATION Left 08/09/2018   Procedure: Left Transmetatarsal Amputation;  Surgeon: [name], MD;  Location: [facility];  Service: Orthopedics;  Laterality: Left;  . AMPUTATION Left 10/08/2018   Procedure: LEFT BELOW KNEE AMPUTATION;  Surgeon: [name], MD;  Location: [facility];  Service: Orthopedics;  Laterality: Left;  . AMPUTATION Left 10/28/2018   Procedure: REVISION BELOW KNEE AMPUTATION;  Surgeon: [name], MD;  Location: [facility];  Service: Orthopedics;  Laterality: Left;  . AMPUTATION Left 12/16/2018   Procedure: LEFT ABOVE KNEE AMPUTATION;  Surgeon: [name], MD;  Location: [facility] [place];  Service: Orthopedics;  Laterality: Left;  . AV FISTULA  PLACEMENT Left 10/14/2018   Procedure: Arteriovenous (Av) Fistula Creation Left Arm;  Surgeon: [name], MD;  Location: [facility];  Service: Vascular;  Laterality: Left;  . BASCILIC VEIN TRANSPOSITION Left 04/14/2019   Procedure: BASILIC VEIN TRANSPOSITION SECOND STAGE LEFT ARM;  Surgeon: Rosetta Posner, MD;  Location: [facility];  Service: Vascular;  Laterality: Left;  . INSERTION OF DIALYSIS CATHETER Right 04/14/2019   Procedure: INSERTION OF DIALYSIS CATHETER;  Surgeon: Rosetta Posner, MD;  Location: [facility];  Service: Vascular;  Laterality: Right;  . LOWER EXTREMITY ANGIOGRAPHY N/A 07/05/2018   Procedure: LOWER EXTREMITY ANGIOGRAPHY;  Surgeon: Serafina Mitchell, MD;  Location: [facility];  Service: Cardiovascular;  Laterality: N/A;  . PERIPHERAL VASCULAR BALLOON ANGIOPLASTY Left 07/05/2018   Procedure: PERIPHERAL VASCULAR BALLOON ANGIOPLASTY;  Surgeon: Serafina Mitchell, MD;  Location: [facility];  Service: Cardiovascular;  Laterality: Left;  SFA  . STUMP REVISION Left 10/19/2018   Procedure: REVISION LEFT BELOW KNEE AMPUTATION;  Surgeon: [name], MD;  Location: [facility];  Service: Orthopedics;  Laterality: Left;  . TUBAL LIGATION  2002     OB History   No obstetric history on  file.     Family History  Problem Relation Age of Onset  . Diabetes Mother   . Diabetes Brother   . Diabetes Daughter   . Diabetes Daughter   . Mental retardation Brother        died from PNA  . Diabetes Maternal Grandmother     Social History   Tobacco Use  . Smoking status: Former Smoker    Packs/day: 0.10    Years: 10.00    Pack years: 1.00    Types: Cigarettes    Start date: 11/17/2013    Quit date: 09/09/2014    Years since quitting: 5.3  . Smokeless tobacco: Never Used  Substance Use Topics  . Alcohol use: Yes    Comment: rare  . Drug use: Yes    Frequency: 4.0 times per week    Types: Marijuana    Home Medications Prior to Admission medications   Medication Sig Start Date  End Date Taking? Authorizing Provider  albuterol (VENTOLIN HFA) 108 (90 Base) MCG/ACT inhaler Inhale 2 puffs into the lungs every 4 (four) hours as needed for wheezing or shortness of breath.    [provider]  amLODipine (NORVASC) 5 MG tablet Take 5 mg by mouth at bedtime. 04/24/19   [provider]  B Complex-C-Folic Acid (RENA-VITE RX) 1 MG TABS Take 1 tablet by mouth daily. 06/16/19   [provider]  bictegravir-emtricitabine-tenofovir AF (BIKTARVY) 50-200-25 MG TABS tablet Take 1 tablet by mouth daily. 08/31/19   Kuppelweiser, Cassie L, RPH-CPP  calcium acetate (PHOSLO) 667 MG capsule Take 2 capsules (1,334 mg total) by mouth 3 (three) times daily with meals. 10/16/18   [name], MD  diphenhydrAMINE (BENADRYL) 50 MG tablet Take 0.5 tablets (25 mg total) by mouth every 6 (six) hours as needed for itching (As needed for itching). 05/03/19   Santos-Sanchez, Merlene Morse, MD  doxycycline (VIBRAMYCIN) 100 MG capsule Take 1 capsule (100 mg total) by mouth 2 (two) times daily for 5 days. 01/06/20 01/11/20  Tedd Sias, PA  ethyl chloride spray Apply topically. 08/24/19   [provider]  gabapentin (NEURONTIN) 300 MG capsule Take 1 capsule (300 mg total) by mouth daily as needed. 05/03/19   [name], MD  glucose blood (ACCU-CHEK GUIDE) test strip USE TO TEST BLOOD SUGAR 3 TIMES DAILY . Diagnosis code  E11.69, E66.9 07/09/17   Lars Mage, MD  Insulin Glargine (LANTUS) 100 UNIT/ML Solostar Pen Inject 5 Units into the skin at bedtime. 05/12/19   Al Decant, MD  lidocaine-prilocaine (EMLA) cream Apply topically. 06/29/19   [provider]  Methoxy PEG-Epoetin Beta (MIRCERA IJ) Mircera 09/21/19 09/19/20  [provider]  moxifloxacin (VIGAMOX) 0.5 % ophthalmic solution  06/16/19   [provider]  ondansetron (ZOFRAN ODT) 4 MG disintegrating tablet Take 1 tablet (4 mg total) by mouth every 8 (eight) hours as needed for nausea or  vomiting. 01/06/20   Tedd Sias, PA  prednisoLONE acetate (PRED FORTE) 1 % ophthalmic suspension INSTILL 1 DROP INTO RIGHT EYE 4 TIMES DAILY FOR 7 DAYS 08/02/19   [provider]  promethazine (PHENERGAN) 25 MG tablet Take 25 mg by mouth 3 (three) times daily as needed. 08/24/19   [provider]  silver sulfADIAZINE (SILVADENE) 1 % cream Apply pea-sized amount to wound daily. 09/11/19   Evelina Bucy, DPM  TUMS 500 MG chewable tablet Chew 2 tablets by mouth at bedtime. 08/25/19   [provider]    Allergies  Patient has no known allergies.  Review of Systems   Review of Systems  Constitutional: Positive for chills, fatigue and fever.  HENT: Negative for congestion.   Eyes: Negative for pain.  Respiratory: Negative for cough and shortness of breath.   Cardiovascular: Negative for chest pain and leg swelling.  Gastrointestinal: Positive for nausea and vomiting. Negative for abdominal pain and diarrhea.  Genitourinary: Negative for dysuria.  Musculoskeletal: Negative for myalgias.  Skin: Negative for rash.  Neurological: Negative for dizziness and headaches.    Physical Exam Updated Vital Signs BP (!) 163/77 (BP Location: Right Arm)   Pulse 93   Temp 99.5 F (37.5 C) (Oral)   Resp 20   Ht 5\' 5"  (1.651 m)   Wt 83.9 kg   LMP 12/13/2019   SpO2 95%   BMI 30.79 kg/m   Physical Exam Vitals and nursing note reviewed. Exam conducted with a chaperone present.  Constitutional:      General: She is not in acute distress. HENT:     Head: Normocephalic and atraumatic.     Nose: Nose normal.     Mouth/Throat:     Mouth: Mucous membranes are moist.  Eyes:     General: No scleral icterus. Cardiovascular:     Rate and Rhythm: Normal rate and regular rhythm.     Pulses: Normal pulses.     Heart sounds: Normal heart sounds.  Pulmonary:     Effort: Pulmonary effort is normal. No respiratory distress.     Breath sounds: No wheezing.  Abdominal:      Palpations: Abdomen is soft.     Tenderness: There is no abdominal tenderness. There is no guarding or rebound.  Genitourinary:    Comments: At the base of the right inner midline butt cheek there is a 4 x 5 cm abscess.  It is fluctuant and tense.  There is no surrounding erythema.  It is tender to palpation. Musculoskeletal:     Cervical back: Normal range of motion.     Right lower leg: No edema.     Left lower leg: No edema.  Skin:    General: Skin is warm and dry.     Capillary Refill: Capillary refill takes less than 2 seconds.  Neurological:     Mental Status: She is alert. Mental status is at baseline.  Psychiatric:        Mood and Affect: Mood normal.        Behavior: Behavior normal.     ED Results / Procedures / Treatments   Labs (all labs ordered are listed, but only abnormal results are displayed) Labs Reviewed  COMPREHENSIVE METABOLIC PANEL - Abnormal; Notable for the following components:      Result Value   Chloride 93 (*)    Glucose, Bld 215 (*)    BUN 43 (*)    Creatinine, Ser 10.48 (*)    Albumin 3.0 (*)    AST 13 (*)    GFR calc non Af Amer 4 (*)    GFR calc Af Amer 5 (*)    Anion gap 21 (*)    All other components within normal limits  CBC WITH DIFFERENTIAL/PLATELET - Abnormal; Notable for the following components:   RBC 3.23 (*)    Hemoglobin 10.4 (*)    HCT 32.2 (*)    RDW 15.8 (*)    All other components within normal limits  LACTIC ACID, PLASMA  LACTIC ACID, PLASMA  I-STAT BETA HCG BLOOD, ED ([facility], [facility],  [facility] ONLY)    EKG None  Radiology No results found.  Procedures .Marland KitchenIncision and Drainage  Date/Time: 01/06/2020 3:57 PM Performed by: Tedd Sias, PA Authorized by: Tedd Sias, PA   Consent:    Consent obtained:  Verbal   Consent given by:  Patient   Risks discussed:  Bleeding, incomplete drainage, pain and damage to other organs   Alternatives discussed:  No treatment Universal protocol:    Procedure explained and questions  answered to patient or proxy's satisfaction: yes     Relevant documents present and verified: yes     Test results available and properly labeled: yes     Imaging studies available: yes     Required blood products, implants, devices, and special equipment available: yes     Site/side marked: yes     Immediately prior to procedure a time out was called: yes     Patient identity confirmed:  Verbally with patient Location:    Type:  Abscess   Size:  4 cm by 5 cm   Location:  Anogenital   Anogenital location: right butt cheek. Pre-procedure details:    Skin preparation:  Betadine Anesthesia (see MAR for exact dosages):    Anesthesia method:  Local infiltration   Local anesthetic:  Lidocaine 2% WITH epi Procedure type:    Complexity:  Complex Procedure details:    Incision type: To straight parallel incisions approximately 8 mm.   Incision depth:  Subcutaneous   Scalpel blade:  11   Wound management:  Probed and deloculated, irrigated with saline and extensive cleaning   Drainage:  Purulent   Drainage amount:  Moderate Post-procedure details:    Patient tolerance of procedure:  Tolerated well, no immediate complications Comments:     No packing placed.  Loop drainage technique was used with nitrile glove   (including critical care time)  Medications Ordered in ED Medications  sodium chloride flush (NS) 0.9 % injection 3 mL (has no administration in time range)  lidocaine-EPINEPHrine (XYLOCAINE W/EPI) 2 %-1:200000 (PF) injection 20 mL (20 mLs Infiltration Given by Other 01/06/20 1541)  oxyCODONE-acetaminophen (PERCOCET/ROXICET) 5-325 MG per tablet 1 tablet (1 tablet Oral Given 01/06/20 1542)  ondansetron (ZOFRAN-ODT) disintegrating tablet 4 mg (4 mg Oral Given 01/06/20 1542)    ED Course  I have reviewed the triage vital signs and the nursing notes.  Pertinent labs & imaging results that were available during my care of the patient were reviewed by me and considered in my medical  decision making (see chart for details).    MDM Rules/Calculators/A&P                      Patient is 44 year old female with past medical history detailed above with HPI detailing her symptoms.  Ultimately found to have a significant large abscess of the right butt cheek.  This was drained.  She has had some systemic symptoms however is afebrile this time and well-appearing.  She is not vomited during ED stay and nausea dissipates with 1 dose of Zofran ODT.  She is not tachycardic or hypotensive.  Doubt significant systemic infection.  Will provide patient with doxycycline as she does have some immunosuppressive diseases and does have a history of some systemic symptoms.  We will also provide patient with Zofran for nausea.  Will recommend follow-up in 3 days with her PCP who will recheck the wound.  Wound care instructions given, warm compresses will be used.  Patient was given  1 Percocet for pain during ED visit.  I discussed this case with my attending physician who cosigned this note including patient's presenting symptoms, physical exam, and planned diagnostics and interventions. Attending physician stated agreement with plan or made changes to plan which were implemented.   Patient tolerating p.o. at time of discharge.  States that her pain is much improved.  Final Clinical Impression(s) / ED Diagnoses Final diagnoses:  Abscess    Rx / DC Orders   Tedd Sias, PA 01/06/20 [place], Riel Hirschman [place], [place] 01/06/20 1602    Virgel Manifold, MD 01/06/20 1731

## 2020-01-06 NOTE — Discharge Instructions (Addendum)
Please follow-up with your primary care doctor on Monday to have the abscess rechecked.  Please call your nephrologist/dialysis center to see if you can have a dialysis session on Monday if not please keep your seat time on Tuesday.  Please use Zofran as needed for nausea.  Please use Tylenol 1000 mg every 6 hours as needed for pain.  Please do warm compresses as we discussed.  Warm "sits baths "where you sit in a shall amount of warm water can be helpful.

## 2020-01-06 NOTE — ED Triage Notes (Signed)
C/o abscess to R buttocks x 3 months.  States she has had 2 rounds of antibiotics and has seen a Psychologist, sport and exercise.  States it is worse with fever, chills, nausea, and vomiting since last night.

## 2020-01-09 ENCOUNTER — Ambulatory Visit: Payer: Medicare Other

## 2020-01-10 ENCOUNTER — Ambulatory Visit (INDEPENDENT_AMBULATORY_CARE_PROVIDER_SITE_OTHER): Payer: Medicare Other | Admitting: Internal Medicine

## 2020-01-10 ENCOUNTER — Other Ambulatory Visit: Payer: Self-pay

## 2020-01-10 VITALS — BP 181/96 | HR 90 | Temp 98.3°F | Ht 69.0 in | Wt 187.7 lb

## 2020-01-10 DIAGNOSIS — L0233 Carbuncle of buttock: Secondary | ICD-10-CM

## 2020-01-10 DIAGNOSIS — E1169 Type 2 diabetes mellitus with other specified complication: Secondary | ICD-10-CM

## 2020-01-10 DIAGNOSIS — I10 Essential (primary) hypertension: Secondary | ICD-10-CM

## 2020-01-10 DIAGNOSIS — E669 Obesity, unspecified: Secondary | ICD-10-CM

## 2020-01-10 DIAGNOSIS — R21 Rash and other nonspecific skin eruption: Secondary | ICD-10-CM

## 2020-01-10 MED ORDER — LIDOCAINE-PRILOCAINE 2.5-2.5 % EX CREA: TOPICAL_CREAM | Freq: Two times a day (BID) | CUTANEOUS | 0 refills | Status: DC | PRN

## 2020-01-10 MED ORDER — INSULIN GLARGINE 100 UNIT/ML SOLOSTAR PEN: 5.0000 [IU] | PEN_INJECTOR | Freq: Every day | SUBCUTANEOUS | 0 refills | Status: DC

## 2020-01-10 NOTE — Assessment & Plan Note (Signed)
Patient requests refill of EMLA cream -Refilled EMLA cream

## 2020-01-10 NOTE — Progress Notes (Signed)
CC: Abscess   HPI:  [name] is a 44 y.o. with a history as noted below who presents today for a wound check after having an abscess drained in the ED on 5/29.  Please refer to the problem based charting for details.  Past Medical History:  Diagnosis Date  . Abdominal pain 02/06/2019  . Altered mental status 06/06/2019  . Below-knee amputation of left lower extremity ([place])   . CKD (chronic kidney disease) stage 5, GFR less than 15 ml/min (HCC) 06/19/2016   06/30/16: Seen by Dr. Posey Pronto [Nephrology]. Suspect etiologies to include diabetes and hypertension though cannot exclude HIV-associated nephropathy. Advised her against from using excessive Goody powder.  . Coagulation defect, unspecified ([place]) 04/24/2019  . Contact dermatitis 04/21/2016   07/29/16: Skin biopsy performed by [facility] for acute spongiotic dermatitis consistent with contact dermatitis.  . Cyclical vomiting    THC induced???  . Depression 06/28/2006   Qualifier: Diagnosis of  By: Riccardo Dubin MD, [place]    . Diabetes mellitus type 2 in obese ([place]) 06/28/1994  . DKA (diabetic ketoacidoses) ([place]) 12/2014  . Dyspnea   . End stage renal disease on dialysis ([place])   . Erosive esophagitis   . Esophageal reflux   . ESRD on hemodialysis ([place]) 05/02/2019  . Essential hypertension 11/16/2013  . Eye redness 06/06/2019  . Fall due to stumbling   . Gastroparesis    ? diabetic  . GERD (gastroesophageal reflux disease)   . History of blood transfusion   . Human immunodeficiency virus (HIV) disease ([place]) 04/23/2016  . Hypertension   . Metabolic bone disease 1/61/0960  . Moderate nonproliferative diabetic retinopathy of both eyes ([place]) 11/21/2014   11/14/14: Noted on retinal imaging; needs follow-up imaging in 6 months  05/22/16: Noted on retinal imaging again; needs follow-up imaging in 6 months  . Normocytic anemia   . Other fluid overload 08/13/2019  . Pain, unspecified 10/07/2019  . Pruritus, unspecified  05/20/2019  . Renal failure (ARF), acute on chronic ([place]) 11/24/2018  . Right leg swelling 02/26/2017  . Shortness of breath 07/01/2019  . TOBACCO USER 06/28/2006   Qualifier: Diagnosis of  By: Riccardo Dubin MD, Sherren Mocha    . Type 2 diabetes mellitus with diabetic peripheral angiopathy without gangrene ([place]) 05/01/2019  . Type II diabetes mellitus ([place]) 1994   diagnosed around 71  . Wound dehiscence   . Wound infection s/p L transmetatarsal amputation    Review of Systems: Systems were reviewed and are otherwise negative unless mentioned in the HPI  Physical Exam:  Vitals:   01/10/20 1024  BP: (!) 170/98  Pulse: 91  Temp: 98.3 F (36.8 C)  TempSrc: Oral  SpO2: 100%  Weight: 187 lb 11.2 oz (85.1 kg)  Height: 5\' 9"  (1.753 m)   Physical Exam Vitals reviewed.  Constitutional:      General: She is not in acute distress.    Appearance: Normal appearance. She is normal weight. She is not ill-appearing or toxic-appearing.  HENT:     Head: Normocephalic.  Pulmonary:     Effort: Pulmonary effort is normal.  Genitourinary:   Neurological:     General: No focal deficit present.     Mental Status: She is alert and oriented to person, place, and time. Mental status is at baseline.  Psychiatric:        Mood and Affect: Mood normal.    Assessment & Plan:   See Encounters Tab for problem based charting.  Patient  discussed with Dr. Heber Nilwood

## 2020-01-10 NOTE — Assessment & Plan Note (Signed)
Patient has a history of hypertension is on amlodipine 5 mg daily.  Her blood pressures were elevated to the 165B systolic today.  Patient is in a reasonable amount of pain today, which could be causing her pressures to be of elevated. Patient does get HD 3 times a week.  I suspect her pressures will improve after her next hemodialysis session.   -Continue amlodipine 5 mg daily

## 2020-01-10 NOTE — Assessment & Plan Note (Signed)
The patient was in the ED on 5/29 due to increased pain, purulent drainage and swelling of an abscess on her right buttocks.  At that time, the patient was having subjective fevers and chills.  It was I&D in the ED, and she was discharged home with a 5-day course of doxycycline. Patient has taken 3/5 days so far.  She denies having any fevers or chills since her ED visit.  She although she has had some tenderness, and the pain is significantly better than what it was when she first went to the ED.  Overall, she feels like things are improving.  On exam, there is a 3 x 3 cm firm, nontender, nondraining induration which appears improved to the photograph in the ED.  Photos may be obtained through the media tab.  Plan: -Continue doxycycline for 2 more days -I do not anticipate she will need further antibiotics

## 2020-01-10 NOTE — Patient Instructions (Addendum)
Thank you for visiting [place] in clinic today.  Below is a summary of what we discussed:  1.  Abscess -Continue taking doxycycline for the next 2 days.  He is finished the entire antibiotic course that was prescribed. -Your abscess looks like it is healing well.  If you start to have increased swelling, pus drainage, fevers and chills, please call us immediately so we can address it.  2.  Follow-up -Please follow-up in 2 months to have your hemoglobin A1c checked  If you have questions or concerns, please feel free to reach out to [place].

## 2020-01-10 NOTE — Assessment & Plan Note (Signed)
Patient states that she needs refill of her Lantus -Refilled Lantus

## 2020-01-22 ENCOUNTER — Encounter: Payer: Self-pay | Admitting: Podiatry

## 2020-01-22 ENCOUNTER — Ambulatory Visit (INDEPENDENT_AMBULATORY_CARE_PROVIDER_SITE_OTHER): Payer: Medicare Other | Admitting: Podiatry

## 2020-01-22 ENCOUNTER — Other Ambulatory Visit: Payer: Self-pay

## 2020-01-22 VITALS — Temp 97.3°F

## 2020-01-22 DIAGNOSIS — Z5321 Procedure and treatment not carried out due to patient leaving prior to being seen by health care provider: Secondary | ICD-10-CM

## 2020-01-22 NOTE — Progress Notes (Signed)
Patient left without being seen.

## 2020-01-23 NOTE — Progress Notes (Signed)
Internal Medicine Clinic Attending  Case discussed with Dr. Alexander at the time of the visit.  We reviewed the resident's history and exam and pertinent patient test results.  I agree with the assessment, diagnosis, and plan of care documented in the resident's note.  

## 2020-01-26 ENCOUNTER — Encounter: Payer: Self-pay | Admitting: *Deleted

## 2020-02-02 ENCOUNTER — Ambulatory Visit: Payer: Medicare Other | Admitting: Podiatry

## 2020-02-23 ENCOUNTER — Other Ambulatory Visit: Payer: Self-pay | Admitting: Pharmacist

## 2020-02-23 DIAGNOSIS — B20 Human immunodeficiency virus [HIV] disease: Secondary | ICD-10-CM

## 2020-04-10 ENCOUNTER — Other Ambulatory Visit: Payer: Self-pay

## 2020-04-10 DIAGNOSIS — B20 Human immunodeficiency virus [HIV] disease: Secondary | ICD-10-CM

## 2020-04-10 MED ORDER — BIKTARVY 50-200-25 MG PO TABS: 1.0000 | ORAL_TABLET | Freq: Every day | ORAL | 0 refills | Status: DC

## 2020-04-16 ENCOUNTER — Telehealth: Payer: Self-pay

## 2020-04-16 NOTE — Telephone Encounter (Signed)
COVID-19 Pre-Screening Questions:04/16/20  Do you currently have a fever (>100 F), chills or unexplained body aches? NO  Are you currently experiencing new cough, shortness of breath, sore throat, runny nose? NO .  Have you been in contact with someone that is currently pending confirmation of Covid19 testing or has been confirmed to have the Covid19 virus? NO  **If the patient answers NO to ALL questions -  advise the patient to please call the clinic before coming to the office should any symptoms develop.    

## 2020-04-17 ENCOUNTER — Ambulatory Visit: Payer: Medicare Other | Admitting: Internal Medicine

## 2020-05-02 DIAGNOSIS — R519 Headache, unspecified: Secondary | ICD-10-CM | POA: Insufficient documentation

## 2020-05-03 ENCOUNTER — Other Ambulatory Visit: Payer: Self-pay

## 2020-05-10 ENCOUNTER — Other Ambulatory Visit: Payer: Self-pay

## 2020-05-10 ENCOUNTER — Ambulatory Visit (INDEPENDENT_AMBULATORY_CARE_PROVIDER_SITE_OTHER): Payer: Medicare Other | Admitting: Student

## 2020-05-10 ENCOUNTER — Encounter: Payer: Self-pay | Admitting: Student

## 2020-05-10 VITALS — BP 175/92 | HR 87 | Wt 184.9 lb

## 2020-05-10 DIAGNOSIS — E1169 Type 2 diabetes mellitus with other specified complication: Secondary | ICD-10-CM | POA: Diagnosis present

## 2020-05-10 DIAGNOSIS — K611 Rectal abscess: Secondary | ICD-10-CM

## 2020-05-10 DIAGNOSIS — E669 Obesity, unspecified: Secondary | ICD-10-CM

## 2020-05-10 DIAGNOSIS — F4323 Adjustment disorder with mixed anxiety and depressed mood: Secondary | ICD-10-CM | POA: Diagnosis not present

## 2020-05-10 DIAGNOSIS — L0233 Carbuncle of buttock: Secondary | ICD-10-CM

## 2020-05-10 LAB — POCT GLYCOSYLATED HEMOGLOBIN (HGB A1C): Hemoglobin A1C: 7.4 % — AB (ref 4.0–5.6)

## 2020-05-10 LAB — GLUCOSE, CAPILLARY: Glucose-Capillary: 122 mg/dL — ABNORMAL HIGH (ref 70–99)

## 2020-05-10 MED ORDER — SERTRALINE HCL 50 MG PO TABS: 50.0000 mg | ORAL_TABLET | Freq: Every day | ORAL | 3 refills | Status: DC

## 2020-05-10 MED ORDER — DOXYCYCLINE HYCLATE 50 MG PO CAPS
100.0000 mg | ORAL_CAPSULE | Freq: Two times a day (BID) | ORAL | 0 refills | Status: DC
Start: 2020-05-10 — End: 2020-05-10

## 2020-05-10 MED ORDER — DOXYCYCLINE HYCLATE 50 MG PO CAPS
100.0000 mg | ORAL_CAPSULE | Freq: Two times a day (BID) | ORAL | 0 refills | Status: AC
Start: 2020-05-10 — End: 2020-05-24

## 2020-05-10 MED ORDER — SERTRALINE HCL 50 MG PO TABS
50.0000 mg | ORAL_TABLET | Freq: Every day | ORAL | 3 refills | Status: DC
Start: 2020-05-10 — End: 2020-05-10

## 2020-05-10 NOTE — Patient Instructions (Addendum)
It was a pleasure seeing you in clinic. Today we discussed:   Abscess - I have given you a 2 week course of antibiotics, and referral to general surgery  Anxiety- Start taking sertraline 50 mg daily. Follow up with you counselor through your ID doctor and follow up in 2 months  If you have any questions or concerns, please call our clinic at [phone] between 9am-5pm and after hours call [phone] and ask for the internal medicine resident on call. If you feel you are having a medical emergency please call 911.   Thank you, we look forward to helping you remain healthy!

## 2020-05-10 NOTE — Progress Notes (Signed)
CC: Recurrent carbuncle  HPI:  Kathy Frank is a 44 y.o. female with a history of HIV on biktarvy, type 2 DM, ESRD, recurrent carbuncle, left above the knee amputation, and depression presents to clinic for recurrence of pain and swelling with possible drainage in the perirectal area for the past week. Please refer to problem based charting for further details and assessment and plan of current problem and chronic medical conditions.    Past Medical History:  Diagnosis Date  . Abdominal pain 02/06/2019  . Altered mental status 06/06/2019  . Below-knee amputation of left lower extremity ([place])   . CKD (chronic kidney disease) stage 5, GFR less than 15 ml/min (HCC) 06/19/2016   06/30/16: Seen by Dr. Posey Pronto [Nephrology]. Suspect etiologies to include diabetes and hypertension though cannot exclude HIV-associated nephropathy. Advised her against from using excessive Goody powder.  . Coagulation defect, unspecified ([place]) 04/24/2019  . Contact dermatitis 04/21/2016   07/29/16: Skin biopsy performed by [facility] notable for acute spongiotic dermatitis consistent with contact dermatitis.  . Cyclical vomiting    THC induced???  . Depression 06/28/2006   Qualifier: Diagnosis of  By: Riccardo Dubin MD, Todd    . Diabetes mellitus type 2 in obese ([place]) 06/28/1994  . DKA (diabetic ketoacidoses) 12/2014  . Dyspnea   . End stage renal disease on dialysis ([place])   . Erosive esophagitis   . Esophageal reflux   . ESRD on hemodialysis ([place]) 05/02/2019  . Essential hypertension 11/16/2013  . Eye redness 06/06/2019  . Fall due to stumbling   . Gastroparesis    ? diabetic  . GERD (gastroesophageal reflux disease)   . History of blood transfusion   . Human immunodeficiency virus (HIV) disease ([place]) 04/23/2016  . Hypertension   . Metabolic bone disease 8/41/6606  . Moderate nonproliferative diabetic retinopathy of both eyes ([place]) 11/21/2014   11/14/14: Noted on retinal imaging; needs follow-up  imaging in 6 months  05/22/16: Noted on retinal imaging again; needs follow-up imaging in 6 months  . Normocytic anemia   . Other fluid overload 08/13/2019  . Pain, unspecified 10/07/2019  . Pruritus, unspecified 05/20/2019  . Renal failure (ARF), acute on chronic ([place]) 11/24/2018  . Right leg swelling 02/26/2017  . Shortness of breath 07/01/2019  . TOBACCO USER 06/28/2006   Qualifier: Diagnosis of  By: Riccardo Dubin MD, Sherren Mocha    . Type 2 diabetes mellitus with diabetic peripheral angiopathy without gangrene ([place]) 05/01/2019  . Type II diabetes mellitus ([place]) 1994   diagnosed around 65  . Wound dehiscence   . Wound infection s/p L transmetatarsal amputation    Review of Systems:  Review of Systems  Constitutional: Negative for chills and fever.  HENT: Negative for congestion and ear pain.   Eyes: Negative for blurred vision and redness.  Respiratory: Negative for cough and shortness of breath.   Cardiovascular: Negative for chest pain and palpitations.  Gastrointestinal: Negative for abdominal pain, blood in stool, constipation, diarrhea and melena.  Genitourinary: Negative for dysuria and hematuria.       Several areas of pain and swelling in perirectal area  Musculoskeletal: Negative for joint pain and myalgias.  Neurological: Negative for dizziness and weakness.  Psychiatric/Behavioral: Negative for hallucinations and suicidal ideas. The patient is nervous/anxious and has insomnia.     Physical Exam:  Vitals:   05/10/20 1553  BP: (!) 175/92  Pulse: 87  SpO2: 98%  Weight: 184 lb 14.4 oz (83.9 kg)   Physical Exam Constitutional:  Appearance: Normal appearance.  HENT:     Head: Normocephalic and atraumatic.  Cardiovascular:     Rate and Rhythm: Normal rate and regular rhythm.     Pulses: Normal pulses.     Heart sounds: Normal heart sounds. No murmur heard.   Genitourinary:    Rectum: Normal.    Musculoskeletal:        General: No swelling.     Cervical back: Normal  range of motion and neck supple.     Comments: left aka  Neurological:     Mental Status: She is alert.      Assessment & Plan:   See Encounters Tab for problem based charting.  Patient seen with Dr. Jimmye Norman

## 2020-05-11 NOTE — Assessment & Plan Note (Signed)
A1c improved to 7.4 compared to 8.5 at last visit. In on Lantus 18 units daily. States blood sugar most morning is around 120s. Diabetes appears better controlled and will continue on current dose of lantus.

## 2020-05-11 NOTE — Assessment & Plan Note (Signed)
Patient has been having difficulty with anxiety since starting dialysis in addition to left above the knee amputation and anticipated birth of grandchild. States she feels worried most day, has difficulty sleeping, increased fatigue, and restless when sitting in her room or during dialysis. States anxiety has worsened in the last 3 -4 moths and now has trouble completing dialysis as she feels significant anxiety and claustrophobia during sessions. She denis SI, HI, hallucinations, and mania. GAD of 18 during visit and PHQ9 of 6. States she previously saw a mental health provider through her infectious disease clinic and would like to follow up again at her next appointment on 05/29/2020. Encouraged her to follow up. Will start her on sertraline 50 mg with follow up in 2 months.

## 2020-05-11 NOTE — Assessment & Plan Note (Signed)
Patient with worsening pain, swelling, and drainage from several areas of abscess in the perirectal area for the past week. Patient treated for this with doxycycline and incision and drainage for similar problem 4 months ago with improvment. States the same issue has reoccurred in the same area and has also developed several other areas in the perirectal area. She denies fever, chills, pain with defecation, or similar lesions in other areas. On exam she has multiple lesions in the prerectal area. Largest area on the right is about 2 cm raised area with mild tenderness to palpation, indurated with purulent drainage. Two other areas of purulent drainage with multiple confluent areas of induration concerning for carbuncles. Referred to general surgery for incision and drainage and started on doxycyline 100 mg twice daily for 14 days.

## 2020-05-13 ENCOUNTER — Encounter: Payer: Self-pay | Admitting: Podiatry

## 2020-05-13 ENCOUNTER — Other Ambulatory Visit: Payer: Self-pay

## 2020-05-13 ENCOUNTER — Ambulatory Visit (INDEPENDENT_AMBULATORY_CARE_PROVIDER_SITE_OTHER): Payer: Medicare Other | Admitting: Podiatry

## 2020-05-13 DIAGNOSIS — M2042 Other hammer toe(s) (acquired), left foot: Secondary | ICD-10-CM

## 2020-05-13 DIAGNOSIS — E119 Type 2 diabetes mellitus without complications: Secondary | ICD-10-CM | POA: Diagnosis not present

## 2020-05-13 DIAGNOSIS — E1151 Type 2 diabetes mellitus with diabetic peripheral angiopathy without gangrene: Secondary | ICD-10-CM

## 2020-05-13 DIAGNOSIS — M2041 Other hammer toe(s) (acquired), right foot: Secondary | ICD-10-CM | POA: Diagnosis not present

## 2020-05-13 DIAGNOSIS — B351 Tinea unguium: Secondary | ICD-10-CM | POA: Diagnosis not present

## 2020-05-13 DIAGNOSIS — Z89612 Acquired absence of left leg above knee: Secondary | ICD-10-CM | POA: Diagnosis not present

## 2020-05-13 NOTE — Progress Notes (Signed)
ANNUAL DIABETIC FOOT EXAM  Subjective: Kathy Frank presents today for for annual diabetic foot examination, at risk foot care. Patient has h/o amputation of above knee amputation left lower extremity and painful thick toenails that are difficult to trim. Pain interferes with ambulation. Aggravating factors include wearing enclosed shoe gear. Pain is relieved with periodic professional debridement.   Patient relates h/o foot wounds right 2nd digit which has healed.  Kathy Beard, MD is patient's PCP. Last visit was 05/10/2020.  Past Medical History:  Diagnosis Date  . Abdominal pain 02/06/2019  . Altered mental status 06/06/2019  . Below-knee amputation of left lower extremity ([place])   . CKD (chronic kidney disease) stage 5, GFR less than 15 ml/min (HCC) 06/19/2016   06/30/16: Seen by Dr. Posey Pronto [Nephrology]. Suspect etiologies to include diabetes and hypertension though cannot exclude HIV-associated nephropathy. Advised her against from using excessive Goody powder.  . Coagulation defect, unspecified ([place]) 04/24/2019  . Contact dermatitis 04/21/2016   07/29/16: Skin biopsy performed by [facility] notable for acute spongiotic dermatitis consistent with contact dermatitis.  . Cyclical vomiting    THC induced???  . Depression 06/28/2006   Qualifier: Diagnosis of  By: Riccardo Dubin MD, Kathy Frank    . Diabetes mellitus type 2 in obese ([place]) 06/28/1994  . DKA (diabetic ketoacidoses) 12/2014  . Dyspnea   . End stage renal disease on dialysis ([place])   . Erosive esophagitis   . Esophageal reflux   . ESRD on hemodialysis ([place]) 05/02/2019  . Essential hypertension 11/16/2013  . Eye redness 06/06/2019  . Fall due to stumbling   . Gastroparesis    ? diabetic  . GERD (gastroesophageal reflux disease)   . History of blood transfusion   . Human immunodeficiency virus (HIV) disease ([place]) 04/23/2016  . Hypertension   . Metabolic bone disease 3/41/9622  . Moderate nonproliferative diabetic  retinopathy of both eyes ([place]) 11/21/2014   11/14/14: Noted on retinal imaging; needs follow-up imaging in 6 months  05/22/16: Noted on retinal imaging again; needs follow-up imaging in 6 months  . Normocytic anemia   . Other fluid overload 08/13/2019  . Pain, unspecified 10/07/2019  . Pruritus, unspecified 05/20/2019  . Renal failure (ARF), acute on chronic ([place]) 11/24/2018  . Right leg swelling 02/26/2017  . Shortness of breath 07/01/2019  . TOBACCO USER 06/28/2006   Qualifier: Diagnosis of  By: Riccardo Dubin MD, Sherren Mocha    . Type 2 diabetes mellitus with diabetic peripheral angiopathy without gangrene ([place]) 05/01/2019  . Type II diabetes mellitus ([place]) 1994   diagnosed around 61  . Wound dehiscence   . Wound infection s/p L transmetatarsal amputation     Patient Active Problem List   Diagnosis Date Noted  . Headache, unspecified 05/02/2020  . Patient's other noncompliance with medication regimen 12/28/2019  . Carbuncle and furuncle of buttock 12/15/2019  . Iron deficiency anemia, unspecified 05/30/2019  . Complication of vascular dialysis catheter 04/24/2019  . Major depressive disorder, single episode, unspecified 04/24/2019  . Secondary hyperparathyroidism of renal origin ([place]) 04/24/2019  . S/P AKA (above knee amputation), left ([place]) 12/16/2018  . Anemia due to chronic kidney disease   . Osteomyelitis ([place]) 10/07/2018  . Moderate protein-calorie malnutrition ([place])   . Adjustment disorder with mixed anxiety and depressed mood 08/15/2018  . ESRD (end stage renal disease) ([place])   . Fecal incontinence 02/04/2018  . Healthcare maintenance 09/17/2017  . Superficial ulcerative lesion ([place]) 02/02/2017  . Aortic atherosclerosis ([place]) 09/30/2016  . Diabetic foot  ulcer associated with diabetes mellitus due to underlying condition ([place]) 07/08/2016  . Human immunodeficiency virus (HIV) disease ([place]) 04/23/2016  . Gastroparesis   . Moderate nonproliferative diabetic retinopathy of both eyes ([place])  11/21/2014  . Esophageal reflux   . Microscopic hematuria 10/31/2014  . Hypertension 11/16/2013  . Tobacco use disorder 06/28/2006  . Depression 06/28/2006  . Diabetes mellitus type 2 in obese [facility]) 06/28/1994    Past Surgical History:  Procedure Laterality Date  . AMPUTATION Left 07/08/2018   Procedure: AMPUTATION FORTH RAY LEFT FOOT;  Surgeon: [name], MD;  Location: [facility];  Service: Orthopedics;  Laterality: Left;  . AMPUTATION Left 08/09/2018   Procedure: Left Transmetatarsal Amputation;  Surgeon: [name], MD;  Location: [facility];  Service: Orthopedics;  Laterality: Left;  . AMPUTATION Left 10/08/2018   Procedure: LEFT BELOW KNEE AMPUTATION;  Surgeon: [name], MD;  Location: [facility];  Service: Orthopedics;  Laterality: Left;  . AMPUTATION Left 10/28/2018   Procedure: REVISION BELOW KNEE AMPUTATION;  Surgeon: [name], MD;  Location: [facility];  Service: Orthopedics;  Laterality: Left;  . AMPUTATION Left 12/16/2018   Procedure: LEFT ABOVE KNEE AMPUTATION;  Surgeon: [name], MD;  Location: [facility];  Service: Orthopedics;  Laterality: Left;  . AV FISTULA PLACEMENT Left 10/14/2018   Procedure: Arteriovenous (Av) Fistula Creation Left Arm;  Surgeon: [name], MD;  Location: [facility];  Service: Vascular;  Laterality: Left;  . BASCILIC VEIN TRANSPOSITION Left 04/14/2019   Procedure: BASILIC VEIN TRANSPOSITION SECOND STAGE LEFT ARM;  Surgeon: Rosetta Posner, MD;  Location: [facility];  Service: Vascular;  Laterality: Left;  . INSERTION OF DIALYSIS CATHETER Right 04/14/2019   Procedure: INSERTION OF DIALYSIS CATHETER;  Surgeon: Rosetta Posner, MD;  Location: [facility];  Service: Vascular;  Laterality: Right;  . LOWER EXTREMITY ANGIOGRAPHY N/A 07/05/2018   Procedure: LOWER EXTREMITY ANGIOGRAPHY;  Surgeon: Serafina Mitchell, MD;  Location: [name];  Service: Cardiovascular;  Laterality: N/A;  . PERIPHERAL VASCULAR BALLOON ANGIOPLASTY Left 07/05/2018   Procedure: PERIPHERAL  VASCULAR BALLOON ANGIOPLASTY;  Surgeon: Serafina Mitchell, MD;  Location: [facility];  Service: Cardiovascular;  Laterality: Left;  SFA  . STUMP REVISION Left 10/19/2018   Procedure: REVISION LEFT BELOW KNEE AMPUTATION;  Surgeon: [name], MD;  Location: [facility];  Service: Orthopedics;  Laterality: Left;  . TUBAL LIGATION  2002    Current Outpatient Medications on File Prior to Visit  Medication Sig Dispense Refill  . albuterol (VENTOLIN HFA) 108 (90 Base) MCG/ACT inhaler Inhale 2 puffs into the lungs every 4 (four) hours as needed for wheezing or shortness of breath.    Marland Kitchen amLODipine (NORVASC) 5 MG tablet Take 5 mg by mouth at bedtime.    . B Complex-C-Folic Acid (RENA-VITE RX) 1 MG TABS Take 1 tablet by mouth daily.    . calcium acetate (PHOSLO) 667 MG capsule Take 2 capsules (1,334 mg total) by mouth 3 (three) times daily with meals. 180 capsule 2  . diphenhydrAMINE (BENADRYL) 50 MG tablet Take 0.5 tablets (25 mg total) by mouth every 6 (six) hours as needed for itching (As needed for itching). 30 tablet 0  . doxycycline (VIBRAMYCIN) 50 MG capsule Take 2 capsules (100 mg total) by mouth 2 (two) times daily for 14 days. 56 capsule 0  . ethyl chloride spray Apply topically.    . gabapentin (NEURONTIN) 300 MG capsule Take 1 capsule (300 mg  total) by mouth daily as needed. 90 capsule 0  . glucose blood (ACCU-CHEK GUIDE) test strip USE TO TEST BLOOD SUGAR 3 TIMES DAILY . Diagnosis code  E11.69, E66.9 100 each 11  . insulin glargine (LANTUS) 100 UNIT/ML Solostar Pen Inject 5 Units into the skin at bedtime. 3 mL 0  . lidocaine-prilocaine (EMLA) cream Apply topically 2 (two) times daily as needed. 30 g 0  . Methoxy PEG-Epoetin Beta (MIRCERA IJ) Mircera    . moxifloxacin (VIGAMOX) 0.5 % ophthalmic solution     . ondansetron (ZOFRAN ODT) 4 MG disintegrating tablet Take 1 tablet (4 mg total) by mouth every 8 (eight) hours as needed for nausea or vomiting. 20 tablet 0  . prednisoLONE acetate  (PRED FORTE) 1 % ophthalmic suspension INSTILL 1 DROP INTO RIGHT EYE 4 TIMES DAILY FOR 7 DAYS    . promethazine (PHENERGAN) 25 MG tablet Take 25 mg by mouth 3 (three) times daily as needed.    . sertraline (ZOLOFT) 50 MG tablet Take 1 tablet (50 mg total) by mouth daily. 90 tablet 3  . silver sulfADIAZINE (SILVADENE) 1 % cream Apply pea-sized amount to wound daily. 50 g 0  . TUMS 500 MG chewable tablet Chew 2 tablets by mouth at bedtime.     No current facility-administered medications on file prior to visit.     No Known Allergies  Social History   Occupational History    Employer: UNEMPLOYED  Tobacco Use  . Smoking status: Former Smoker    Packs/day: 0.10    Years: 10.00    Pack years: 1.00    Types: Cigarettes    Start date: 11/17/2013    Quit date: 09/09/2014    Years since quitting: 5.6  . Smokeless tobacco: Never Used  Vaping Use  . Vaping Use: Never used  Substance and Sexual Activity  . Alcohol use: Yes    Comment: rare  . Drug use: Yes    Frequency: 4.0 times per week    Types: Marijuana  . Sexual activity: Not on file    Family History  Problem Relation Age of Onset  . Diabetes Mother   . Diabetes Brother   . Diabetes Daughter   . Diabetes Daughter   . Mental retardation Brother        died from PNA  . Diabetes Maternal Grandmother     Immunization History  Administered Date(s) Administered  . Hepatitis B, adult 05/27/2016, 07/08/2016, 05/25/2019, 06/22/2019, 07/20/2019  . Influenza,inj,Quad PF,6+ Mos 04/21/2016, 05/17/2017, 10/09/2018  . Meningococcal Mcv4o 07/08/2016, 10/07/2016  . Pneumococcal Conjugate-13 01/05/2018  . Pneumococcal Polysaccharide-23 11/16/2013  . Tdap 09/17/2017     Objective: There were no vitals filed for this visit.  Kathy Frank is a pleasant 44 y.o. African American  female in NAD. AAO X 3.  Vascular Examination: Capillary refill time to digits <4 seconds right lower extremity. Nonpalpable DP pulse(s) right lower  extremity. Nonpalpable PT pulse(s) right lower extremity. Pedal hair absent. Lower extremity skin temperature gradient within normal limits. No pain with calf compression b/l. No edema noted right lower extremity. No ischemia or gangrene noted right lower extremity.  Dermatological Examination: Toenails 1-5 right elongated, discolored, dystrophic, thickened, and crumbly with subungual debris and tenderness to dorsal palpation. No open wounds RLE. No interdigital macerations RLE.  Musculoskeletal Examination: Muscle strength 5/5 to all LE muscle groups of right lower extremity. Hammertoes noted to the R 2nd toe and R 3rd toe. Lower extremity amputation(s): above knee amputation left  lower extremity. Utilizes wheelchair for mobility assistance.  Footwear Assessment: Does the patient wear appropriate shoes? Yes Does the patient need inserts/orthotics? Yes.  Neurological Examination: Protective sensation decreased with 10 gram monofilament b/l. Vibratory sensation decreased b/l.  Hemoglobin A1C Latest Ref Rng & Units 05/10/2020 11/23/2019 06/07/2019  HGBA1C 4.0 - 5.6 % 7.4(A) 8.5 6.4(H)  Some recent data might be hidden   Assessment: 1. Onychomycosis   2. S/P AKA (above knee amputation), left ([place])   3. Acquired hammertoes of both feet   4. Type II diabetes mellitus with peripheral circulatory disorder (HCC)   5. Encounter for diabetic foot exam ([place])     ADA Risk Categorization:  High Risk  Patient has one or more of the following: Loss of protective sensation Absent pedal pulses Severe Foot deformity History of foot ulcer  Plan: -Examined patient. -Continue diabetic foot care principles. -Toenails 1-5 right debrided in length and girth without iatrogenic bleeding with sterile nail nipper and dremel.  -Patient to report any pedal injuries to medical professional immediately. -Patient to continue soft, supportive shoe gear daily. Start procedure for diabetic shoes. Patient qualifies  based on diagnoses. -Patient/POA to call should there be question/concern in the interim.  Return in about 9 weeks (around 07/15/2020).  Marzetta Board, DPM

## 2020-05-13 NOTE — Progress Notes (Signed)
Internal Medicine Clinic Attending  I saw and evaluated the patient.  I personally confirmed the key portions of the history and exam documented by Dr. Lisabeth Devoid and I reviewed pertinent patient test results.  The assessment, diagnosis, and plan were formulated together and I agree with the documentation in the resident's note.  Patient's abscess lesions are tunneling and intercommunicating below skin surface and evidence of old sinus tracts are visible.  She is not nearly as tender as would be expected.  The chronicity and complexity of the lesions would best be treated by a surgeon; we are unable to procedurally manage this in our clinic setting.

## 2020-05-14 ENCOUNTER — Other Ambulatory Visit: Payer: Self-pay

## 2020-05-14 DIAGNOSIS — B20 Human immunodeficiency virus [HIV] disease: Secondary | ICD-10-CM

## 2020-05-14 MED ORDER — BIKTARVY 50-200-25 MG PO TABS: 1.0000 | ORAL_TABLET | Freq: Every day | ORAL | 0 refills | Status: DC

## 2020-05-16 ENCOUNTER — Telehealth: Payer: Self-pay | Admitting: Internal Medicine

## 2020-05-16 NOTE — Telephone Encounter (Signed)
-----   Message from [name], MD sent at 05/11/2020  2:05 PM EDT ----- Hello Dr. Megan Salon,  I saw this patient in clinic yesterday and she was endorsing worsening anxiety. She mentioned she has seen a mental health provider through your clinic in the past and wished to follow up with them again. She is scheduled to see you in clinic on 05/29/2020, it would be helpful for her to have follow up for anxiety at that time. If this is no long an option for I can also make arrangements through our clinic as well.  Thank you, [name]

## 2020-05-20 ENCOUNTER — Telehealth: Payer: Self-pay | Admitting: *Deleted

## 2020-05-20 NOTE — Telephone Encounter (Signed)
Beverly with [facility] called asking if it was ok to fill the Rx for doxycycline through mail order. Rx was written 05/10/2020 and sent to [facility] at [facility]. Call placed to [facility] at [facility]. States when he tries to run Rx now, it tells him it was filled and mailed to patient on 05/13/2020 by a mail order pharmacy. He is not able to see which mail order or why it was not filled by [facility] on 05/10/2020. Call placed to patient. No answer. Left message on VM requesting return call. Hubbard Hartshorn, RN, BSN

## 2020-05-28 DIAGNOSIS — F419 Anxiety disorder, unspecified: Secondary | ICD-10-CM | POA: Insufficient documentation

## 2020-05-29 ENCOUNTER — Encounter: Payer: Self-pay | Admitting: Internal Medicine

## 2020-05-29 ENCOUNTER — Other Ambulatory Visit: Payer: Self-pay

## 2020-05-29 ENCOUNTER — Ambulatory Visit (INDEPENDENT_AMBULATORY_CARE_PROVIDER_SITE_OTHER): Payer: Medicare Other | Admitting: Internal Medicine

## 2020-05-29 VITALS — BP 185/103 | HR 85 | Temp 98.3°F

## 2020-05-29 DIAGNOSIS — B20 Human immunodeficiency virus [HIV] disease: Secondary | ICD-10-CM

## 2020-05-29 DIAGNOSIS — F419 Anxiety disorder, unspecified: Secondary | ICD-10-CM | POA: Diagnosis present

## 2020-05-29 DIAGNOSIS — Z23 Encounter for immunization: Secondary | ICD-10-CM | POA: Diagnosis not present

## 2020-05-29 NOTE — Assessment & Plan Note (Signed)
I will arrange a virtual visit with our behavioral health counselor.

## 2020-05-29 NOTE — Assessment & Plan Note (Signed)
Her adherence to [place] Odor is very good and her function has been under good control.  She will get repeat blood work today, continue Airline pilot and follow-up in 6 weeks.  She received her influenza vaccine here today.  She has recently received her initial 2 Pfizer Covid vaccines.

## 2020-05-29 NOTE — Progress Notes (Signed)
Patient Active Problem List   Diagnosis Date Noted  . Anxiety 05/28/2020    Priority: High  . Human immunodeficiency virus (HIV) disease ([place]) 04/23/2016    Priority: Medium  . Headache, unspecified 05/02/2020  . Patient's other noncompliance with medication regimen 12/28/2019  . Carbuncle and furuncle of buttock 12/15/2019  . Iron deficiency anemia, unspecified 05/30/2019  . Complication of vascular dialysis catheter 04/24/2019  . Major depressive disorder, single episode, unspecified 04/24/2019  . Secondary hyperparathyroidism of renal origin ([place]) 04/24/2019  . S/P AKA (above knee amputation), left ([place]) 12/16/2018  . Anemia due to chronic kidney disease   . Osteomyelitis ([place]) 10/07/2018  . Moderate protein-calorie malnutrition ([place])   . Adjustment disorder with mixed anxiety and depressed mood 08/15/2018  . ESRD (end stage renal disease) ([place])   . Fecal incontinence 02/04/2018  . Healthcare maintenance 09/17/2017  . Superficial ulcerative lesion ([place]) 02/02/2017  . Aortic atherosclerosis ([place]) 09/30/2016  . Diabetic foot ulcer associated with diabetes mellitus due to underlying condition ([place]) 07/08/2016  . Gastroparesis   . Moderate nonproliferative diabetic retinopathy of both eyes ([place]) 11/21/2014  . Esophageal reflux   . Microscopic hematuria 10/31/2014  . Hypertension 11/16/2013  . Tobacco use disorder 06/28/2006  . Depression 06/28/2006  . Diabetes mellitus type 2 in obese [facility]) 06/28/1994    Patient's Medications  New Prescriptions   No medications on file  Previous Medications   ALBUTEROL (VENTOLIN HFA) 108 (90 BASE) MCG/ACT INHALER    Inhale 2 puffs into the lungs every 4 (four) hours as needed for wheezing or shortness of breath.   AMLODIPINE (NORVASC) 5 MG TABLET    Take 5 mg by mouth at bedtime.   B COMPLEX-C-FOLIC ACID (RENA-VITE RX) 1 MG TABS    Take 1 tablet by mouth daily.   BICTEGRAVIR-EMTRICITABINE-TENOFOVIR AF (BIKTARVY) 50-200-25 MG  TABS TABLET    Take 1 tablet by mouth daily. ADDITIONAL REFILLS WILL BE GIVEN AT APPOINTMENT   CALCIUM ACETATE (PHOSLO) 667 MG CAPSULE    Take 2 capsules (1,334 mg total) by mouth 3 (three) times daily with meals.   DIPHENHYDRAMINE (BENADRYL) 50 MG TABLET    Take 0.5 tablets (25 mg total) by mouth every 6 (six) hours as needed for itching (As needed for itching).   ETHYL CHLORIDE SPRAY    Apply topically.   GABAPENTIN (NEURONTIN) 300 MG CAPSULE    Take 1 capsule (300 mg total) by mouth daily as needed.   GLUCOSE BLOOD (ACCU-CHEK GUIDE) TEST STRIP    USE TO TEST BLOOD SUGAR 3 TIMES DAILY . Diagnosis code  E11.69, E66.9   INSULIN GLARGINE (LANTUS) 100 UNIT/ML SOLOSTAR PEN    Inject 5 Units into the skin at bedtime.   LIDOCAINE-PRILOCAINE (EMLA) CREAM    Apply topically 2 (two) times daily as needed.   METHOXY PEG-EPOETIN BETA (MIRCERA IJ)    Mircera   MOXIFLOXACIN (VIGAMOX) 0.5 % OPHTHALMIC SOLUTION       ONDANSETRON (ZOFRAN ODT) 4 MG DISINTEGRATING TABLET    Take 1 tablet (4 mg total) by mouth every 8 (eight) hours as needed for nausea or vomiting.   PREDNISOLONE ACETATE (PRED FORTE) 1 % OPHTHALMIC SUSPENSION    INSTILL 1 DROP INTO RIGHT EYE 4 TIMES DAILY FOR 7 DAYS   PROMETHAZINE (PHENERGAN) 25 MG TABLET    Take 25 mg by mouth 3 (three) times daily as needed.   SERTRALINE (ZOLOFT) 50 MG TABLET    Take  1 tablet (50 mg total) by mouth daily.   SILVER SULFADIAZINE (SILVADENE) 1 % CREAM    Apply pea-sized amount to wound daily.   TUMS 500 MG CHEWABLE TABLET    Chew 2 tablets by mouth at bedtime.  Modified Medications   No medications on file  Discontinued Medications   No medications on file    Subjective: Kathy Frank is in for her routine HIV follow-up visit.  She has not had any problems tolerating her taking her Biktarvy.  She thinks she is only missed about 3 doses in the past year when her pharmacy was late giving her a refill.  She has been struggling with increased anxiety and panic attacks  recently.  She says she thinks about her HIV status every day.  She has not disclosed her status to anyone since she was first diagnosed.  Review of Systems: Review of Systems  Constitutional: Negative for fever.  Psychiatric/Behavioral: Positive for depression. The patient is nervous/anxious.     Past Medical History:  Diagnosis Date  . Abdominal pain 02/06/2019  . Altered mental status 06/06/2019  . Below-knee amputation of left lower extremity ([place])   . CKD (chronic kidney disease) stage 5, GFR less than 15 ml/min (HCC) 06/19/2016   06/30/16: Seen by Dr. Posey Pronto [Nephrology]. Suspect etiologies to include diabetes and hypertension though cannot exclude HIV-associated nephropathy. Advised her against from using excessive Goody powder.  . Coagulation defect, unspecified ([place]) 04/24/2019  . Contact dermatitis 04/21/2016   07/29/16: Skin biopsy performed by [facility] notable for acute spongiotic dermatitis consistent with contact dermatitis.  . Cyclical vomiting    THC induced???  . Depression 06/28/2006   Qualifier: Diagnosis of  By: Riccardo Dubin MD, [place]    . Diabetes mellitus type 2 in obese ([place]) 06/28/1994  . DKA (diabetic ketoacidoses) 12/2014  . Dyspnea   . End stage renal disease on dialysis ([place])   . Erosive esophagitis   . Esophageal reflux   . ESRD on hemodialysis ([place]) 05/02/2019  . Essential hypertension 11/16/2013  . Eye redness 06/06/2019  . Fall due to stumbling   . Gastroparesis    ? diabetic  . GERD (gastroesophageal reflux disease)   . History of blood transfusion   . Human immunodeficiency virus (HIV) disease ([place]) 04/23/2016  . Hypertension   . Metabolic bone disease 01/24/736  . Moderate nonproliferative diabetic retinopathy of both eyes ([place]) 11/21/2014   11/14/14: Noted on retinal imaging; needs follow-up imaging in 6 months  05/22/16: Noted on retinal imaging again; needs follow-up imaging in 6 months  . Normocytic anemia   . Other fluid overload  08/13/2019  . Pain, unspecified 10/07/2019  . Pruritus, unspecified 05/20/2019  . Renal failure (ARF), acute on chronic ([place]) 11/24/2018  . Right leg swelling 02/26/2017  . Shortness of breath 07/01/2019  . TOBACCO USER 06/28/2006   Qualifier: Diagnosis of  By: Riccardo Dubin MD, Sherren Mocha    . Type 2 diabetes mellitus with diabetic peripheral angiopathy without gangrene ([place]) 05/01/2019  . Type II diabetes mellitus ([place]) 1994   diagnosed around 30  . Wound dehiscence   . Wound infection s/p L transmetatarsal amputation     Social History   Tobacco Use  . Smoking status: Former Smoker    Packs/day: 0.10    Years: 10.00    Pack years: 1.00    Types: Cigarettes    Start date: 11/17/2013    Quit date: 09/09/2014    Years since quitting: 5.7  . Smokeless  tobacco: Never Used  Vaping Use  . Vaping Use: Never used  Substance Use Topics  . Alcohol use: Yes    Comment: rare  . Drug use: Yes    Frequency: 4.0 times per week    Types: Marijuana    Family History  Problem Relation Age of Onset  . Diabetes Mother   . Diabetes Brother   . Diabetes Daughter   . Diabetes Daughter   . Mental retardation Brother        died from PNA  . Diabetes Maternal Grandmother     No Known Allergies  Health Maintenance  Topic Date Due  . LIPID PANEL  10/07/2017  . COVID-19 Vaccine (2 - Moderna 2-dose series) 11/28/2019  . INFLUENZA VACCINE  03/10/2020  . URINE MICROALBUMIN  03/19/2020  . OPHTHALMOLOGY EXAM  07/20/2020  . HEMOGLOBIN A1C  08/10/2020  . PAP SMEAR-Modifier  02/04/2021  . FOOT EXAM  05/13/2021  . TETANUS/TDAP  09/18/2027  . PNEUMOCOCCAL POLYSACCHARIDE VACCINE AGE 32-64 HIGH RISK  Completed  . Hepatitis C Screening  Completed  . HIV Screening  Completed    Objective:  Vitals:   05/29/20 1130  BP: (!) 185/103  Pulse: 85  Temp: 98.3 F (36.8 C)  TempSrc: Oral   There is no height or weight on file to calculate BMI.  Physical Exam Constitutional:      Comments: She is  talkative and in good spirits.  She is seated in her wheelchair.  Cardiovascular:     Rate and Rhythm: Normal rate and regular rhythm.     Heart sounds: No murmur heard.   Pulmonary:     Effort: Pulmonary effort is normal.     Breath sounds: Normal breath sounds.  Psychiatric:        Mood and Affect: Mood normal.     Lab Results Lab Results  Component Value Date   WBC 8.1 01/06/2020   HGB 10.4 (L) 01/06/2020   HCT 32.2 (L) 01/06/2020   MCV 99.7 01/06/2020   PLT 301 01/06/2020    Lab Results  Component Value Date   CREATININE 10.48 (H) 01/06/2020   BUN 43 (H) 01/06/2020   NA 140 01/06/2020   K 4.8 01/06/2020   CL 93 (L) 01/06/2020   CO2 26 01/06/2020    Lab Results  Component Value Date   ALT 8 01/06/2020   AST 13 (L) 01/06/2020   ALKPHOS 57 01/06/2020   BILITOT 0.3 01/06/2020    Lab Results  Component Value Date   CHOL 182 10/07/2016   HDL 54 10/07/2016   LDLCALC 113 (H) 10/07/2016   TRIG 76 10/07/2016   CHOLHDL 3.4 10/07/2016   Lab Results  Component Value Date   LABRPR Non Reactive 02/02/2017   HIV 1 RNA Quant (copies/mL)  Date Value  02/23/2019 <20 NOT DETECTED  10/18/2018 <20  09/13/2017 <20   HIV-1 RNA Viral Load (copies/mL)  Date Value  12/23/2017 80  02/02/2017 80   CD4 T Cell Abs (/uL)  Date Value  02/23/2019 526  10/29/2018 700  12/23/2017 450     Problem List Items Addressed This Visit      High   Anxiety    I will arrange a virtual visit with our behavioral health counselor.        Medium   Human immunodeficiency virus (HIV) disease ([place]) (Chronic)    Her adherence to [place] is very good and her function has been under good control.  She will get  repeat blood work today, continue Airline pilot and follow-up in 6 weeks.  She received her influenza vaccine here today.  She has recently received her initial 2 Pfizer Covid vaccines.      Relevant Orders   T-helper cell (CD4)- ([facility] clinic only)   HIV-1 RNA quant-no reflex-bld    CBC   Comprehensive metabolic panel   RPR        Michel Bickers, MD [facility] for [place] [phone] pager   [phone] cell 05/29/2020, 11:52 AM

## 2020-05-30 ENCOUNTER — Telehealth: Payer: Self-pay | Admitting: Infectious Disease

## 2020-05-30 LAB — T-HELPER CELL (CD4) - (RCID CLINIC ONLY)
CD4 % Helper T Cell: 33 % (ref 33–65)
CD4 T Cell Abs: 563 /uL (ref 400–1790)

## 2020-05-30 NOTE — Telephone Encounter (Signed)
Was called about the patient's critical creatinine value by [facility].  Clearly is not a critical value since she is on dialysis.  Not sure why I was called in some also not on-call but just documenting in case [facility] calls again

## 2020-06-01 LAB — CBC
HCT: 36.8 % (ref 35.0–45.0)
Hemoglobin: 12.2 g/dL (ref 11.7–15.5)
MCH: 31.4 pg (ref 27.0–33.0)
MCHC: 33.2 g/dL (ref 32.0–36.0)
MCV: 94.8 fL (ref 80.0–100.0)
MPV: 10.4 fL (ref 7.5–12.5)
Platelets: 307 10*3/uL (ref 140–400)
RBC: 3.88 10*6/uL (ref 3.80–5.10)
RDW: 14.1 % (ref 11.0–15.0)
WBC: 5.3 10*3/uL (ref 3.8–10.8)

## 2020-06-01 LAB — HIV-1 RNA QUANT-NO REFLEX-BLD
HIV 1 RNA Quant: <20 Copies/mL
HIV-1 RNA Quant, Log: <1.3 Log cps/mL

## 2020-06-01 LAB — COMPREHENSIVE METABOLIC PANEL
AG Ratio: 1 (calc) (ref 1.0–2.5)
ALT: 5 U/L — ABNORMAL LOW (ref 6–29)
AST: 10 U/L (ref 10–30)
Albumin: 4.3 g/dL (ref 3.6–5.1)
Alkaline phosphatase (APISO): 51 U/L (ref 31–125)
BUN/Creatinine Ratio: 4 (calc) — ABNORMAL LOW (ref 6–22)
BUN: 51 mg/dL — ABNORMAL HIGH (ref 7–25)
CO2: 19 mmol/L — ABNORMAL LOW (ref 20–32)
Calcium: 9.3 mg/dL (ref 8.6–10.2)
Chloride: 97 mmol/L — ABNORMAL LOW (ref 98–110)
Creat: 12.07 mg/dL — ABNORMAL HIGH (ref 0.50–1.10)
Globulin: 4.1 g/dL (calc) — ABNORMAL HIGH (ref 1.9–3.7)
Glucose, Bld: 135 mg/dL — ABNORMAL HIGH (ref 65–99)
Potassium: 5 mmol/L (ref 3.5–5.3)
Sodium: 139 mmol/L (ref 135–146)
Total Bilirubin: 0.3 mg/dL (ref 0.2–1.2)
Total Protein: 8.4 g/dL — ABNORMAL HIGH (ref 6.1–8.1)

## 2020-06-01 LAB — RPR: RPR Ser Ql: NONREACTIVE

## 2020-06-04 ENCOUNTER — Other Ambulatory Visit: Payer: Self-pay

## 2020-06-04 DIAGNOSIS — B20 Human immunodeficiency virus [HIV] disease: Secondary | ICD-10-CM

## 2020-06-04 MED ORDER — BIKTARVY 50-200-25 MG PO TABS: 1.0000 | ORAL_TABLET | Freq: Every day | ORAL | 4 refills | Status: DC

## 2020-06-06 ENCOUNTER — Other Ambulatory Visit: Payer: Self-pay

## 2020-06-06 ENCOUNTER — Ambulatory Visit: Payer: Medicare Other

## 2020-06-17 ENCOUNTER — Emergency Department (HOSPITAL_COMMUNITY)
Admission: EM | Admit: 2020-06-17 | Discharge: 2020-06-17 | Disposition: A | Discharge status: Home / Self Care | Payer: Medicare Other | Attending: Emergency Medicine | Admitting: Emergency Medicine

## 2020-06-17 ENCOUNTER — Emergency Department (HOSPITAL_COMMUNITY): Payer: Medicare Other

## 2020-06-17 ENCOUNTER — Encounter (HOSPITAL_COMMUNITY): Payer: Self-pay | Admitting: Pediatrics

## 2020-06-17 ENCOUNTER — Other Ambulatory Visit: Payer: Self-pay

## 2020-06-17 DIAGNOSIS — Z5321 Procedure and treatment not carried out due to patient leaving prior to being seen by health care provider: Secondary | ICD-10-CM | POA: Insufficient documentation

## 2020-06-17 DIAGNOSIS — R0602 Shortness of breath: Secondary | ICD-10-CM | POA: Insufficient documentation

## 2020-06-17 LAB — CBC WITH DIFFERENTIAL/PLATELET
Abs Immature Granulocytes: 0.02 10*3/uL (ref 0.00–0.07)
Basophils Absolute: 0.1 10*3/uL (ref 0.0–0.1)
Basophils Relative: 1 %
Eosinophils Absolute: 0.4 10*3/uL (ref 0.0–0.5)
Eosinophils Relative: 5 %
HCT: 35.1 % — ABNORMAL LOW (ref 36.0–46.0)
Hemoglobin: 10.9 g/dL — ABNORMAL LOW (ref 12.0–15.0)
Immature Granulocytes: 0 %
Lymphocytes Relative: 22 %
Lymphs Abs: 1.7 10*3/uL (ref 0.7–4.0)
MCH: 31.6 pg (ref 26.0–34.0)
MCHC: 31.1 g/dL (ref 30.0–36.0)
MCV: 101.7 fL — ABNORMAL HIGH (ref 80.0–100.0)
Monocytes Absolute: 0.6 10*3/uL (ref 0.1–1.0)
Monocytes Relative: 7 %
Neutro Abs: 5 10*3/uL (ref 1.7–7.7)
Neutrophils Relative %: 65 %
Platelets: 284 10*3/uL (ref 150–400)
RBC: 3.45 MIL/uL — ABNORMAL LOW (ref 3.87–5.11)
RDW: 15.6 % — ABNORMAL HIGH (ref 11.5–15.5)
WBC: 7.8 10*3/uL (ref 4.0–10.5)
nRBC: 0.3 % — ABNORMAL HIGH (ref 0.0–0.2)

## 2020-06-17 LAB — COMPREHENSIVE METABOLIC PANEL
ALT: 8 U/L (ref 0–44)
AST: 11 U/L — ABNORMAL LOW (ref 15–41)
Albumin: 3.4 g/dL — ABNORMAL LOW (ref 3.5–5.0)
Alkaline Phosphatase: 44 U/L (ref 38–126)
Anion gap: 21 — ABNORMAL HIGH (ref 5–15)
BUN: 53 mg/dL — ABNORMAL HIGH (ref 6–20)
CO2: 20 mmol/L — ABNORMAL LOW (ref 22–32)
Calcium: 9.7 mg/dL (ref 8.9–10.3)
Chloride: 101 mmol/L (ref 98–111)
Creatinine, Ser: 12.41 mg/dL — ABNORMAL HIGH (ref 0.44–1.00)
GFR, Estimated: 3 mL/min — ABNORMAL LOW (ref >60)
Glucose, Bld: 149 mg/dL — ABNORMAL HIGH (ref 70–99)
Potassium: 5 mmol/L (ref 3.5–5.1)
Sodium: 142 mmol/L (ref 135–145)
Total Bilirubin: 0.8 mg/dL (ref 0.3–1.2)
Total Protein: 8.3 g/dL — ABNORMAL HIGH (ref 6.5–8.1)

## 2020-06-17 NOTE — ED Notes (Addendum)
Called pt for vitals multiple times no answer

## 2020-06-17 NOTE — ED Triage Notes (Signed)
C/O shortness of breath, unrelieved by home nebs. Stated she does HD Tu/TH/Sa and missed last Saturday.

## 2020-06-17 NOTE — ED Notes (Signed)
Pt called 3x no response  

## 2020-06-18 ENCOUNTER — Ambulatory Visit: Payer: Medicare Other

## 2020-06-18 ENCOUNTER — Telehealth: Payer: Self-pay | Admitting: *Deleted

## 2020-06-18 NOTE — Telephone Encounter (Signed)
Attempted to call pt to schedule appt 11/10 lm for rtc

## 2020-06-18 NOTE — Telephone Encounter (Signed)
Pt calls while on dialysis machine. States she is short of breath on and off. She is able to speak for over 1 minute without being noted short of breath. Went to ED but left because she didn't want to wait. She has not gained weight, does not feel bloated, no extra girth in trunk of body, denies swelling of hands, legs or feet. States happens more when lays down to rest. States she has nausea, denies vomiting, h/a's. Denies all other s/s of COVID. She also states the dialysis nurse told her to call her pcp.  Please advise  [phone]

## 2020-06-18 NOTE — Telephone Encounter (Signed)
Acute concerns I expect would be evaluated by staff at the dialysis center.  If shortness of breath is related to her volume status she would benefit from finishing dialysis and I will leave this to the nephrologist.  On review of chart I see mentioned she missed one dialysis session last Saturday. If patient continues to have shortness of breath she can be scheduled for an appointment tomorrow.  She should be sent to the ED if this represents acute hypoxia, but given description this seems less likely.

## 2020-06-20 ENCOUNTER — Ambulatory Visit: Payer: Medicare Other

## 2020-06-20 ENCOUNTER — Other Ambulatory Visit: Payer: Self-pay

## 2020-06-21 ENCOUNTER — Other Ambulatory Visit: Payer: Self-pay | Admitting: *Deleted

## 2020-06-21 ENCOUNTER — Other Ambulatory Visit: Payer: Self-pay

## 2020-06-21 MED ORDER — ACCU-CHEK GUIDE VI STRP: ORAL_STRIP | 3 refills | Status: DC

## 2020-06-21 NOTE — Telephone Encounter (Signed)
Requesting to speak with a nurse about meds, please call pt back.  

## 2020-06-21 NOTE — Telephone Encounter (Signed)
Started new encounter

## 2020-06-28 ENCOUNTER — Telehealth: Payer: Self-pay

## 2020-06-28 NOTE — Telephone Encounter (Signed)
Checks blood sugar 4 times a day, ran out of strips too soon. They told her she could fill it today.  She agreed to call her pharmacy to get it filled this weekend and then we can increase her testing frequency next week. It may require extra paperwork that could slow down her ability to refill.

## 2020-06-28 NOTE — Telephone Encounter (Signed)
Please call pt back about test strips.

## 2020-06-28 NOTE — Telephone Encounter (Signed)
Kathy Frank, Per chart review, her test strips were sent on 11/12.  Do you mind calling her to see what kind of problem she is having? Thank you, Mariany Mackintosh

## 2020-07-01 ENCOUNTER — Other Ambulatory Visit: Payer: Self-pay | Admitting: *Deleted

## 2020-07-01 ENCOUNTER — Other Ambulatory Visit: Payer: Self-pay | Admitting: Dietician

## 2020-07-01 DIAGNOSIS — E1169 Type 2 diabetes mellitus with other specified complication: Secondary | ICD-10-CM

## 2020-07-01 DIAGNOSIS — E669 Obesity, unspecified: Secondary | ICD-10-CM

## 2020-07-01 MED ORDER — ACCU-CHEK GUIDE VI STRP: ORAL_STRIP | 3 refills | Status: DC

## 2020-07-01 MED ORDER — ACCU-CHEK FASTCLIX LANCETS MISC: 3 refills | Status: DC

## 2020-07-01 NOTE — Telephone Encounter (Signed)
Butch Penny please f/u on pt's testing problems w/ [facility]

## 2020-07-01 NOTE — Telephone Encounter (Signed)
Kathy Frank says she checks blood sugar 4 times a day and prescription was for 3 times a day, so she ran out of strips too soon and could not check her blood sugar.  Suggest updating her prescription to 4 times a day. She also needs a prescription for Accu chek Fastclix lancets

## 2020-07-10 ENCOUNTER — Ambulatory Visit: Payer: Medicare Other | Admitting: Internal Medicine

## 2020-07-10 MED ORDER — ACCU-CHEK GUIDE VI STRP: ORAL_STRIP | 3 refills | Status: DC

## 2020-07-10 MED ORDER — ACCU-CHEK FASTCLIX LANCETS MISC: 3 refills | Status: DC

## 2020-07-10 NOTE — Telephone Encounter (Addendum)
Called pharmacy who insists that medicare B limits testing frequency. (This is not accurate, increased testing frequency requires documented medical necessity) They request new prescription for 3 times a day testing. She is a personal Continuous glucose monitoring candidate. I am happy to help her with getting one if desired.

## 2020-07-17 ENCOUNTER — Ambulatory Visit: Payer: Medicare Other | Admitting: Internal Medicine

## 2020-07-22 ENCOUNTER — Ambulatory Visit (INDEPENDENT_AMBULATORY_CARE_PROVIDER_SITE_OTHER): Payer: Medicare Other | Admitting: Podiatry

## 2020-07-22 ENCOUNTER — Other Ambulatory Visit: Payer: Self-pay

## 2020-07-22 DIAGNOSIS — E0822 Diabetes mellitus due to underlying condition with diabetic chronic kidney disease: Secondary | ICD-10-CM | POA: Diagnosis not present

## 2020-07-22 DIAGNOSIS — Z89612 Acquired absence of left leg above knee: Secondary | ICD-10-CM

## 2020-07-22 DIAGNOSIS — B351 Tinea unguium: Secondary | ICD-10-CM | POA: Diagnosis not present

## 2020-07-22 DIAGNOSIS — N186 End stage renal disease: Secondary | ICD-10-CM | POA: Diagnosis not present

## 2020-07-22 DIAGNOSIS — Z794 Long term (current) use of insulin: Secondary | ICD-10-CM | POA: Diagnosis not present

## 2020-07-22 DIAGNOSIS — M2042 Other hammer toe(s) (acquired), left foot: Secondary | ICD-10-CM

## 2020-07-22 DIAGNOSIS — L942 Calcinosis cutis: Secondary | ICD-10-CM | POA: Diagnosis not present

## 2020-07-22 DIAGNOSIS — Z992 Dependence on renal dialysis: Secondary | ICD-10-CM

## 2020-07-22 DIAGNOSIS — M2041 Other hammer toe(s) (acquired), right foot: Secondary | ICD-10-CM

## 2020-07-24 ENCOUNTER — Telehealth: Payer: Self-pay

## 2020-07-24 NOTE — Telephone Encounter (Signed)
Pls contact pt regarding walker [phone]

## 2020-07-27 ENCOUNTER — Encounter: Payer: Self-pay | Admitting: Podiatry

## 2020-07-27 NOTE — Progress Notes (Signed)
ANNUAL DIABETIC FOOT EXAM  Subjective: Kathy Frank presents today for at risk foot care. Patient has h/o amputation of above knee amputation left lower extremity and painful thick toenails that are difficult to trim. Pain interferes with ambulation. Aggravating factors include wearing enclosed shoe gear. Pain is relieved with periodic professional debridement.   Patient is questioning lesion on outside of right heel. She is unsure how long it has been there. Denies any drainage, swelling or injury to foot.  Kathy Beard, MD is patient's PCP. Last visit was 05/10/2020.  Past Medical History:  Diagnosis Date  . Abdominal pain 02/06/2019  . Altered mental status 06/06/2019  . Below-knee amputation of left lower extremity ([place])   . CKD (chronic kidney disease) stage 5, GFR less than 15 ml/min (HCC) 06/19/2016   06/30/16: Seen by Dr. Posey Pronto [Nephrology]. Suspect etiologies to include diabetes and hypertension though cannot exclude HIV-associated nephropathy. Advised her against from using excessive Goody powder.  . Coagulation defect, unspecified ([place]) 04/24/2019  . Contact dermatitis 04/21/2016   07/29/16: Skin biopsy performed by [facility] notable for acute spongiotic dermatitis consistent with contact dermatitis.  . Cyclical vomiting    THC induced???  . Depression 06/28/2006   Qualifier: Diagnosis of  By: Riccardo Dubin MD, Todd    . Diabetes mellitus type 2 in obese ([place]) 06/28/1994  . DKA (diabetic ketoacidoses) 12/2014  . Dyspnea   . End stage renal disease on dialysis ([place])   . Erosive esophagitis   . Esophageal reflux   . ESRD on hemodialysis ([place]) 05/02/2019  . Essential hypertension 11/16/2013  . Eye redness 06/06/2019  . Fall due to stumbling   . Gastroparesis    ? diabetic  . GERD (gastroesophageal reflux disease)   . History of blood transfusion   . Human immunodeficiency virus (HIV) disease ([place]) 04/23/2016  . Hypertension   . Metabolic bone disease 1/74/0814   . Moderate nonproliferative diabetic retinopathy of both eyes ([place]) 11/21/2014   11/14/14: Noted on retinal imaging; needs follow-up imaging in 6 months  05/22/16: Noted on retinal imaging again; needs follow-up imaging in 6 months  . Normocytic anemia   . Other fluid overload 08/13/2019  . Pain, unspecified 10/07/2019  . Pruritus, unspecified 05/20/2019  . Renal failure (ARF), acute on chronic ([place]) 11/24/2018  . Right leg swelling 02/26/2017  . Shortness of breath 07/01/2019  . TOBACCO USER 06/28/2006   Qualifier: Diagnosis of  By: Riccardo Dubin MD, Sherren Mocha    . Type 2 diabetes mellitus with diabetic peripheral angiopathy without gangrene ([place]) 05/01/2019  . Type II diabetes mellitus ([place]) 1994   diagnosed around 73  . Wound dehiscence   . Wound infection s/p L transmetatarsal amputation     Patient Active Problem List   Diagnosis Date Noted  . Anxiety 05/28/2020  . Headache, unspecified 05/02/2020  . Patient's other noncompliance with medication regimen 12/28/2019  . Carbuncle and furuncle of buttock 12/15/2019  . Iron deficiency anemia, unspecified 05/30/2019  . Complication of vascular dialysis catheter 04/24/2019  . Major depressive disorder, single episode, unspecified 04/24/2019  . Secondary hyperparathyroidism of renal origin ([place]) 04/24/2019  . S/P AKA (above knee amputation), left ([place]) 12/16/2018  . Anemia due to chronic kidney disease   . Osteomyelitis ([place]) 10/07/2018  . Moderate protein-calorie malnutrition ([place])   . Adjustment disorder with mixed anxiety and depressed mood 08/15/2018  . ESRD (end stage renal disease) ([place])   . Fecal incontinence 02/04/2018  . Healthcare maintenance 09/17/2017  . Superficial  ulcerative lesion ([place]) 02/02/2017  . Aortic atherosclerosis ([employer]) 09/30/2016  . Diabetic foot ulcer associated with diabetes mellitus due to underlying condition ([place]) 07/08/2016  . Human immunodeficiency virus (HIV) disease ([place]) 04/23/2016  . Gastroparesis   .  Moderate nonproliferative diabetic retinopathy of both eyes ([place]) 11/21/2014  . Esophageal reflux   . Microscopic hematuria 10/31/2014  . Hypertension 11/16/2013  . Tobacco use disorder 06/28/2006  . Depression 06/28/2006  . Diabetes mellitus type 2 in obese [facility]) 06/28/1994    Past Surgical History:  Procedure Laterality Date  . AMPUTATION Left 07/08/2018   Procedure: AMPUTATION FORTH RAY LEFT FOOT;  Surgeon: [name], MD;  Location: [facility];  Service: Orthopedics;  Laterality: Left;  . AMPUTATION Left 08/09/2018   Procedure: Left Transmetatarsal Amputation;  Surgeon: [name], MD;  Location: [facility];  Service: Orthopedics;  Laterality: Left;  . AMPUTATION Left 10/08/2018   Procedure: LEFT BELOW KNEE AMPUTATION;  Surgeon: [name], MD;  Location: [facility];  Service: Orthopedics;  Laterality: Left;  . AMPUTATION Left 10/28/2018   Procedure: REVISION BELOW KNEE AMPUTATION;  Surgeon: [name], MD;  Location: [facility];  Service: Orthopedics;  Laterality: Left;  . AMPUTATION Left 12/16/2018   Procedure: LEFT ABOVE KNEE AMPUTATION;  Surgeon: [name], MD;  Location: [place];  Service: Orthopedics;  Laterality: Left;  . AV FISTULA PLACEMENT Left 10/14/2018   Procedure: Arteriovenous (Av) Fistula Creation Left Arm;  Surgeon: [name], MD;  Location: [facility];  Service: Vascular;  Laterality: Left;  . BASCILIC VEIN TRANSPOSITION Left 04/14/2019   Procedure: BASILIC VEIN TRANSPOSITION SECOND STAGE LEFT ARM;  Surgeon: Rosetta Posner, MD;  Location: [facility];  Service: Vascular;  Laterality: Left;  . INSERTION OF DIALYSIS CATHETER Right 04/14/2019   Procedure: INSERTION OF DIALYSIS CATHETER;  Surgeon: Rosetta Posner, MD;  Location: [facility];  Service: Vascular;  Laterality: Right;  . LOWER EXTREMITY ANGIOGRAPHY N/A 07/05/2018   Procedure: LOWER EXTREMITY ANGIOGRAPHY;  Surgeon: Serafina Mitchell, MD;  Location: [facility] CV [facility];  Service: Cardiovascular;  Laterality: N/A;  . PERIPHERAL  VASCULAR BALLOON ANGIOPLASTY Left 07/05/2018   Procedure: PERIPHERAL VASCULAR BALLOON ANGIOPLASTY;  Surgeon: Serafina Mitchell, MD;  Location: [place] [facility];  Service: Cardiovascular;  Laterality: Left;  SFA  . STUMP REVISION Left 10/19/2018   Procedure: REVISION LEFT BELOW KNEE AMPUTATION;  Surgeon: [name], MD;  Location: [facility];  Service: Orthopedics;  Laterality: Left;  . TUBAL LIGATION  2002    Current Outpatient Medications on File Prior to Visit  Medication Sig Dispense Refill  . Acetaminophen 160 MG PACK Take by mouth.    . Cholecalciferol (VITAMIN D HIGH POTENCY PO) Take by mouth.    Marland Kitchen DIPHENHYDRAMINE-ACETAMINOPHEN PO Take by mouth.    Marland Kitchen glucose blood test strip Accu check smart  strip    . HEPARIN SODIUM, PORCINE, IV Heparin Sodium (Porcine) 1,000 Units/mL Systemic    . iron sucrose in sodium chloride 0.9 % 100 mL Iron Sucrose (Venofer)    . Accu-Chek FastClix Lancets MISC Check blood sugar up to 3 times a day 306 each 3  . albuterol (VENTOLIN HFA) 108 (90 Base) MCG/ACT inhaler Inhale 2 puffs into the lungs every 4 (four) hours as needed for wheezing or shortness of breath.    Marland Kitchen amLODipine (NORVASC) 5 MG tablet Take 5 mg by mouth at bedtime.    . B Complex-C-Folic Acid (RENA-VITE RX) 1 MG TABS Take  1 tablet by mouth daily.    . bictegravir-emtricitabine-tenofovir AF (BIKTARVY) 50-200-25 MG TABS tablet Take 1 tablet by mouth daily. Take one tab by mouth daily. 30 tablet 4  . calcium acetate (PHOSLO) 667 MG capsule Take 2 capsules (1,334 mg total) by mouth 3 (three) times daily with meals. 180 capsule 2  . diphenhydrAMINE (BENADRYL) 50 MG tablet Take 0.5 tablets (25 mg total) by mouth every 6 (six) hours as needed for itching (As needed for itching). 30 tablet 0  . ethyl chloride spray Apply topically.    . gabapentin (NEURONTIN) 300 MG capsule Take 1 capsule (300 mg total) by mouth daily as needed. 90 capsule 0  . glucose blood (ACCU-CHEK GUIDE) test strip USE TO TEST BLOOD  SUGAR 3 TIMES DAILY . 300 each 3  . insulin glargine (LANTUS) 100 UNIT/ML Solostar Pen Inject 5 Units into the skin at bedtime. 3 mL 0  . lidocaine-prilocaine (EMLA) cream Apply topically 2 (two) times daily as needed. 30 g 0  . Methoxy PEG-Epoetin Beta (MIRCERA IJ) Mircera    . moxifloxacin (VIGAMOX) 0.5 % ophthalmic solution     . ondansetron (ZOFRAN ODT) 4 MG disintegrating tablet Take 1 tablet (4 mg total) by mouth every 8 (eight) hours as needed for nausea or vomiting. 20 tablet 0  . prednisoLONE acetate (PRED FORTE) 1 % ophthalmic suspension INSTILL 1 DROP INTO RIGHT EYE 4 TIMES DAILY FOR 7 DAYS    . promethazine (PHENERGAN) 25 MG tablet Take 25 mg by mouth 3 (three) times daily as needed.    . sertraline (ZOLOFT) 50 MG tablet Take 1 tablet (50 mg total) by mouth daily. 90 tablet 3  . silver sulfADIAZINE (SILVADENE) 1 % cream Apply pea-sized amount to wound daily. 50 g 0  . TUMS 500 MG chewable tablet Chew 2 tablets by mouth at bedtime.     No current facility-administered medications on file prior to visit.     No Known Allergies  Social History   Occupational History    Employer: UNEMPLOYED  Tobacco Use  . Smoking status: Former Smoker    Packs/day: 0.10    Years: 10.00    Pack years: 1.00    Types: Cigarettes    Start date: 11/17/2013    Quit date: 09/09/2014    Years since quitting: 5.8  . Smokeless tobacco: Never Used  Vaping Use  . Vaping Use: Never used  Substance and Sexual Activity  . Alcohol use: Yes    Comment: rare  . Drug use: Yes    Frequency: 4.0 times per week    Types: Marijuana  . Sexual activity: Not on file    Family History  Problem Relation Age of Onset  . Diabetes Mother   . Diabetes Brother   . Diabetes Daughter   . Diabetes Daughter   . Mental retardation Brother        died from PNA  . Diabetes Maternal Grandmother     Immunization History  Administered Date(s) Administered  . Hepatitis B, adult 05/27/2016, 07/08/2016, 05/25/2019,  06/22/2019, 07/20/2019  . Influenza,inj,Quad PF,6+ Mos 04/21/2016, 05/17/2017, 10/09/2018, 05/29/2020  . Meningococcal Mcv4o 07/08/2016, 10/07/2016  . Pneumococcal Conjugate-13 01/05/2018  . Pneumococcal Polysaccharide-23 11/16/2013  . Tdap 09/17/2017     Objective: There were no vitals filed for this visit.  Kathy Frank is a pleasant 44 y.o. African American  female in NAD. AAO X 3.  Vascular Examination: Capillary refill time to digits <4 seconds right lower extremity. Nonpalpable  DP pulse(s) right lower extremity. Nonpalpable PT pulse(s) right lower extremity. Pedal hair absent. Lower extremity skin temperature gradient within normal limits. No pain with calf compression b/l. No edema noted right lower extremity. No ischemia or gangrene noted right lower extremity.  Dermatological Examination: Toenails 1-5 right elongated, discolored, dystrophic, thickened, and crumbly with subungual debris and tenderness to dorsal palpation. No open wounds RLE. No interdigital macerations RLE.   Firm, raised annular nodule noted on lateral aspect of right heel. No drainage, no erythema, no edema, no pain on palpation. No abnormal warmth/coolness. Surrounding skin normal.  Musculoskeletal Examination: Muscle strength 5/5 to all LE muscle groups of right lower extremity. Hammertoes noted to the R 2nd toe and R 3rd toe. Lower extremity amputation(s): above knee amputation left lower extremity. Utilizes wheelchair for mobility assistance.  Neurological Examination: Protective sensation decreased with 10 gram monofilament b/l. Vibratory sensation decreased b/l.  Hemoglobin A1C Latest Ref Rng & Units 05/10/2020 11/23/2019  HGBA1C 4.0 - 5.6 % 7.4(A) 8.5  Some recent data might be hidden   Assessment: 1. Onychomycosis   2. Calcinosis cutis   3. Acquired hammertoes of both feet   4. S/P AKA (above knee amputation), left ([place])   5. Diabetes mellitus due to underlying condition with chronic kidney  disease on chronic dialysis, with long-term current use of insulin ([place])    Plan: -Examined patient. -Continue diabetic foot care principles. -Toenails 1-5 right debrided in length and girth without iatrogenic bleeding with sterile nail nipper and dremel.  -Explained calcinosis cutis as a cutaneous finding in dialysis patients. Patient related understanding. Monitor. -Patient to report any pedal injuries to medical professional immediately. -Patient to continue soft, supportive shoe gear daily. Start procedure for diabetic shoes. Patient qualifies based on diagnoses. -Patient/POA to call should there be question/concern in the interim.  Return in about 3 months (around 10/20/2020) for diabetic foot care.  Marzetta Board, DPM

## 2020-07-30 NOTE — Telephone Encounter (Signed)
Pls contact regarding walker; pt been calling since 12/15 and no call back [phone]

## 2020-07-31 NOTE — Telephone Encounter (Signed)
I returned phone call today about her getting a walker,patient said that her walker is old and hard to use. The patient is going to come in and see her PCP about this and some other things she would like to discuss,Appointment is January 3,2022@9 :15.

## 2020-08-12 ENCOUNTER — Telehealth: Payer: Self-pay

## 2020-08-12 ENCOUNTER — Encounter: Payer: Medicare Other | Admitting: Student

## 2020-08-12 NOTE — Telephone Encounter (Signed)
Patient calls to request Accu chek Smartview test strips for checking her blood sugar 4 times a day. She wants this prescription to be sent to [facility] on [address].

## 2020-08-12 NOTE — Telephone Encounter (Signed)
Pt states she is sick, please call pt back.

## 2020-08-12 NOTE — Telephone Encounter (Signed)
Called pt, she has cold like symptoms, low grade fever x 3 days, advised visit to ur[facility] care at [facility]

## 2020-08-12 NOTE — Addendum Note (Signed)
Addended by: Resa Miner on: 08/12/2020 09:38 AM   Modules accepted: Orders

## 2020-08-14 ENCOUNTER — Other Ambulatory Visit: Payer: Self-pay | Admitting: Dietician

## 2020-08-14 ENCOUNTER — Other Ambulatory Visit: Payer: Self-pay

## 2020-08-14 ENCOUNTER — Ambulatory Visit (INDEPENDENT_AMBULATORY_CARE_PROVIDER_SITE_OTHER): Payer: 59 | Admitting: Student

## 2020-08-14 DIAGNOSIS — E1169 Type 2 diabetes mellitus with other specified complication: Secondary | ICD-10-CM

## 2020-08-14 DIAGNOSIS — Z89612 Acquired absence of left leg above knee: Secondary | ICD-10-CM | POA: Diagnosis not present

## 2020-08-14 DIAGNOSIS — R0600 Dyspnea, unspecified: Secondary | ICD-10-CM

## 2020-08-14 DIAGNOSIS — R0602 Shortness of breath: Secondary | ICD-10-CM

## 2020-08-14 MED ORDER — ACCU-CHEK SMARTVIEW VI STRP: ORAL_STRIP | 12 refills | Status: DC

## 2020-08-14 NOTE — Progress Notes (Signed)
Requests Smartview strips for her Accu chek Nano meter

## 2020-08-16 ENCOUNTER — Telehealth: Payer: Self-pay

## 2020-08-16 NOTE — Telephone Encounter (Signed)
CM sent to [facility] at [facility] for Rolling walker. F2F was 08/14/2020.   Order, demographics, MCD card, and OV notes from 08/14/2020 addressing need for scooter faxed to [facility] and Mob[employer] at [phone]. Fax confirmation receipt received. Hubbard Hartshorn, BSN, RN-BC

## 2020-08-16 NOTE — Telephone Encounter (Signed)
Samples, Matthias Hughs, Orvis Brill, RN; Samples, [place]; Bobby Rumpf, [place]; [place], [place]; [place], [place]; 1 other  got it, reviewing now. thanks.

## 2020-08-16 NOTE — Progress Notes (Signed)
   CC: Need for walker and electric scooter  This is a telephone encounter between Kathy Frank and Kathy Frank on 08/16/2020 for DME needs. The visit was conducted with the patient located at home and Kathy Frank at [facility]. The patient's identity was confirmed using their DOB and current address. The patient has consented to being evaluated through a telephone encounter and understands the associated risks (an examination cannot be done and the patient may need to come in for an appointment) / benefits (allows the patient to remain at home, decreasing exposure to coronavirus). I personally spent 12 minutes on medical discussion.   HPI:  [name] is a 45 y.o. with PMH as below.   Please see A&P for assessment of the patient's acute and chronic medical conditions.   Past Medical History:  Diagnosis Date  . Abdominal pain 02/06/2019  . Altered mental status 06/06/2019  . Below-knee amputation of left lower extremity ([place])   . CKD (chronic kidney disease) stage 5, GFR less than 15 ml/min (HCC) 06/19/2016   06/30/16: Seen by Dr. Posey Pronto [Nephrology]. Suspect etiologies to include diabetes and hypertension though cannot exclude HIV-associated nephropathy. Advised her against from using excessive Goody powder.  . Coagulation defect, unspecified ([place]) 04/24/2019  . Contact dermatitis 04/21/2016   07/29/16: Skin biopsy performed by [facility] for acute spongiotic dermatitis consistent with contact dermatitis.  . Cyclical vomiting    THC induced???  . Depression 06/28/2006   Qualifier: Diagnosis of  By: Riccardo Dubin MD, [place]    . Diabetes mellitus type 2 in obese ([place]) 06/28/1994  . DKA (diabetic ketoacidoses) 12/2014  . Dyspnea   . End stage renal disease on dialysis ([place])   . Erosive esophagitis   . Esophageal reflux   . ESRD on hemodialysis ([place]) 05/02/2019  . Essential hypertension 11/16/2013  . Eye redness 06/06/2019  . Fall due to stumbling   . Gastroparesis    ?  diabetic  . GERD (gastroesophageal reflux disease)   . History of blood transfusion   . Human immunodeficiency virus (HIV) disease ([place]) 04/23/2016  . Hypertension   . Metabolic bone disease 2/69/4854  . Moderate nonproliferative diabetic retinopathy of both eyes ([place]) 11/21/2014   11/14/14: Noted on retinal imaging; needs follow-up imaging in 6 months  05/22/16: Noted on retinal imaging again; needs follow-up imaging in 6 months  . Normocytic anemia   . Other fluid overload 08/13/2019  . Pain, unspecified 10/07/2019  . Pruritus, unspecified 05/20/2019  . Renal failure (ARF), acute on chronic ([place]) 11/24/2018  . Right leg swelling 02/26/2017  . Shortness of breath 07/01/2019  . TOBACCO USER 06/28/2006   Qualifier: Diagnosis of  By: Riccardo Dubin MD, Sherren Mocha    . Type 2 diabetes mellitus with diabetic peripheral angiopathy without gangrene ([place]) 05/01/2019  . Type II diabetes mellitus ([place]) 1994   diagnosed around 78  . Wound dehiscence   . Wound infection s/p L transmetatarsal amputation    Review of Systems:  Negative as per HPI   Assessment & Plan:   See Encounters Tab for problem based charting.  Patient discussed with Dr. Dareen Piano

## 2020-08-16 NOTE — Telephone Encounter (Signed)
Pls contact leann regarding pt

## 2020-08-16 NOTE — Assessment & Plan Note (Signed)
Patient has had a walker since she had an L AKA in 2020 and instructed on how to use the walker at the hospital after her surgery. She uses her walker to help get around her house to go to the bath room, cook, do laundry. Her current walker is old and bent making it unstable, she is concerned she will fall due to this. Would like a new rolling walker due to this. Patient would benefit from a new walker as she it greatly increases her mobility and current walker is damaged and not functioning properly.   Plan -DME rolling walker ordered today

## 2020-08-16 NOTE — Progress Notes (Signed)
Internal Medicine Clinic Attending ? ?Case discussed with Dr. Liang  At the time of the visit.  We reviewed the resident?s history and exam and pertinent patient test results.  I agree with the assessment, diagnosis, and plan of care documented in the resident?s note. ? ?

## 2020-08-16 NOTE — Telephone Encounter (Signed)
Samples, [employer], [place]; [place], Orvis Brill, RN; Skeet Latch; Sheran Lawless, [place]; [place], [place]; 1 other  order created and sent to processor team for follow up. thanks.

## 2020-08-16 NOTE — Assessment & Plan Note (Addendum)
Patient reports for the past 3-4 months has been having increased dyspnea during dialysis and requiring O2. Also endorses some SOB outside of dialysis especially when she need to stand for long periods such has when getting groceries. States nephrologist has put in referral for her to see a pulmonologist about this. States due to this dyspnea she has needed to use an Transport planner while shopping, however at her local store the scooters are broken or there is a long wait to get one. States she generally has to wait 30 minutes to and hour to use a scooter when getting groceries and would like to see if she could get a electric scooter to help with her errands. Dyspnea is limited her functional ability to complete daily errands and patient would benefit from have a electric scooter to facilitate this while having work up for pulmonary pathology.  Plan DME electric scooter ordered Follow up with pulmonology

## 2020-08-19 ENCOUNTER — Ambulatory Visit: Payer: Self-pay | Admitting: General Surgery

## 2020-08-19 NOTE — H&P (Signed)
  The patient is a 45 year old female who presents with a subcutaneous abscess. Past medical history of HIV, type 2 diabetes, ESRD, and LLE amputation, who underwent I&D of right sided perianal abscess in the ER in May 2021. Apparently, the area released a large amount of purulence. She saw Dr. Kae Heller in October 2021 in urgent office. At that time, Dr. Kae Heller believed she had hidradenitis of this area and not necessarily abscesses. She presented 2 months ago to urgent office complaining of continued pain, stating that it never fully resolved. She was started on a trial of antibiotics but that did not change her symptoms. Denies drainage. She reports intermittent swelling and pain in the area.   Problem List/Past Medical Leighton Ruff, MD; 6/62/9476 11:01 AM) PERIANAL LESION (K62.9) PERIRECTAL ABSCESS (K61.1)  Diagnostic Studies History Leighton Ruff, MD; 5/46/5035 11:01 AM) Pap Smear 1-5 years ago  Allergies Leighton Ruff, MD; 4/65/6812 11:01 AM) No Known Drug Allergies [12/22/2019]:  Medication History Leighton Ruff, MD; 7/51/7001 11:01 AM) Albuterol (90MCG/ACT Aerosol Soln, Inhalation) Active. Doxycycline Hyclate (100MG  Tablet, Oral) Active. Insulin Lispro (100UNIT/ML Solution, 5 units Subcutaneous) Active. Promethazine HCl (25MG  Tablet, Oral) Active. Sertraline HCl (50MG  Tablet, Oral) Active. Bictegravir-Emtricitab-Tenofov (50-200-25MG  Tablet, Oral) Active. Ondansetron (4MG  Tablet, Oral) Active.  Pregnancy / Birth History Leighton Ruff, MD; 7/49/4496 11:01 AM) Age at menarche 22 years. Gravida 4 Maternal age 43-20 Para 3 Regular periods  Other Problems Leighton Ruff, MD; 7/59/1638 11:01 AM) Chronic Renal Failure Syndrome Diabetes Mellitus     Review of Systems Leighton Ruff MD; 4/66/5993 11:01 AM) All other systems negative   Physical Exam Leighton Ruff MD; 5/70/1779 11:01 AM)  General Mental Status-Alert. General  Appearance-Cooperative.  Rectal Note: Right lateral perianal area of inflammation draining purulence. It is approximately 2 cm in diameter. There are no other lesions noted. There is no palpable cord.    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 This is either hidradenitis or a small perianal fistula. I have recommended an exam under anesthesia with debridement or fistulotomy or seton placement. Depending on what we find. We discussed that if a seton is placed. She will need to return to the operating room for a second procedure. An approximately 3-4 months. We discussed that if the debridement is performed, she will have a wound, which will most likely heal slowly due to her chronic medical conditions. We also discussed risk of incontinence with fistulotomy and will hold this for only the most superficial fistulas.

## 2020-09-03 ENCOUNTER — Telehealth: Payer: Self-pay | Admitting: Internal Medicine

## 2020-09-03 NOTE — Telephone Encounter (Signed)
   Reason for call:   I received a call from Kathy Frank at 8:00 PM indicating that she has been experiencing nose bleeds all day.  Kathy Frank states that after returning from dialysis this morning, she began having nosebleeds.  She has been packing her nose and keeping her head tilted back but she continues to have output with clots at times.  She has been blowing her nose quite frequently to get the clots out.  She notes that she did get heparin at dialysis today.  She notes feeling lightheaded, however states that sometimes this happens after dialysis but it may be from the bleeding to.  She denies any falls or loss of consciousness.  She denies any current blood thinner use other than the heparin that was used at dialysis today   Pertinent Data:   No past medical history of nosebleeds.  Last CBC November 2021 showed macrocytic anemia with hemoglobin of 10.9.   Assessment / Plan / Recommendations:   Nosebleed: I recommended patient tilt her head back, and applied pressure to her nose by pinching for 10 minutes.  If the bleeding should recur, I recommended she try direct pressure again but for a total of 20 minutes.  Instructed to not blow her nose as this may dislodge any scab that has formed.  I recommended if after these measures, her bleeding does not stop, I recommended she go to the ER for further evaluation.  [name] expressed understanding and is in agreement with this plan.  As always, pt is advised that if symptoms worsen or new symptoms arise, they should go to an urgent care facility or to to ER for further evaluation.   Jose Persia, MD   09/03/2020, 8:10 PM

## 2020-09-20 ENCOUNTER — Other Ambulatory Visit: Payer: Self-pay

## 2020-09-20 DIAGNOSIS — B20 Human immunodeficiency virus [HIV] disease: Secondary | ICD-10-CM

## 2020-09-20 MED ORDER — BIKTARVY 50-200-25 MG PO TABS: 1.0000 | ORAL_TABLET | Freq: Every day | ORAL | 0 refills | Status: DC

## 2020-09-23 ENCOUNTER — Institutional Professional Consult (permissible substitution): Payer: Medicare Other | Admitting: Pulmonary Disease

## 2020-09-23 NOTE — Progress Notes (Deleted)
Subjective:   PATIENT ID: Kathy Frank GENDER: female DOB: Aug 11, 1975, MRN: [id number]   HPI  No chief complaint on file.   Reason for Visit: New consult for ***  [name] is a  *** Social History:  Environmental exposures: ***  I have personally reviewed patient's past medical/family/social history, allergies, current medications.***  Past Medical History:  Diagnosis Date  . Abdominal pain 02/06/2019  . Altered mental status 06/06/2019  . Below-knee amputation of left lower extremity ([place])   . CKD (chronic kidney disease) stage 5, GFR less than 15 ml/min (HCC) 06/19/2016   06/30/16: Seen by Dr. Posey Pronto [Nephrology]. Suspect etiologies to include diabetes and hypertension though cannot exclude HIV-associated nephropathy. Advised her against from using excessive Goody powder.  . Coagulation defect, unspecified ([place]) 04/24/2019  . Contact dermatitis 04/21/2016   07/29/16: Skin biopsy performed by [facility] notable for acute spongiotic dermatitis consistent with contact dermatitis.  . Cyclical vomiting    THC induced???  . Depression 06/28/2006   Qualifier: Diagnosis of  By: Riccardo Dubin MD, [place]    . Diabetes mellitus type 2 in obese ([place]) 06/28/1994  . DKA (diabetic ketoacidoses) 12/2014  . Dyspnea   . End stage renal disease on dialysis ([place])   . Erosive esophagitis   . Esophageal reflux   . ESRD on hemodialysis ([place]) 05/02/2019  . Essential hypertension 11/16/2013  . Eye redness 06/06/2019  . Fall due to stumbling   . Gastroparesis    ? diabetic  . GERD (gastroesophageal reflux disease)   . History of blood transfusion   . Human immunodeficiency virus (HIV) disease ([place]) 04/23/2016  . Hypertension   . Metabolic bone disease [id number]  . Moderate nonproliferative diabetic retinopathy of both eyes ([place]) 11/21/2014   11/14/14: Noted on retinal imaging; needs follow-up imaging in 6 months  05/22/16: Noted on retinal imaging again; needs follow-up imaging  in 6 months  . Normocytic anemia   . Other fluid overload 08/13/2019  . Pain, unspecified 10/07/2019  . Pruritus, unspecified 05/20/2019  . Renal failure (ARF), acute on chronic ([place]) 11/24/2018  . Right leg swelling 02/26/2017  . Shortness of breath 07/01/2019  . TOBACCO USER 06/28/2006   Qualifier: Diagnosis of  By: Riccardo Dubin MD, Sherren Mocha    . Type 2 diabetes mellitus with diabetic peripheral angiopathy without gangrene ([place]) 05/01/2019  . Type II diabetes mellitus ([place]) 1994   diagnosed around 77  . Wound dehiscence   . Wound infection s/p L transmetatarsal amputation      Family History  Problem Relation Age of Onset  . Diabetes Mother   . Diabetes Brother   . Diabetes Daughter   . Diabetes Daughter   . Mental retardation Brother        died from PNA  . Diabetes Maternal Grandmother      Social History   Occupational History    Employer: UNEMPLOYED  Tobacco Use  . Smoking status: Former Smoker    Packs/day: 0.10    Years: 10.00    Pack years: 1.00    Types: Cigarettes    Start date: 11/17/2013    Quit date: 09/09/2014    Years since quitting: 6.0  . Smokeless tobacco: Never Used  Vaping Use  . Vaping Use: Never used  Substance and Sexual Activity  . Alcohol use: Yes    Comment: rare  . Drug use: Yes    Frequency: 4.0 times per week    Types: Marijuana  . Sexual  activity: Not on file    No Known Allergies   Outpatient Medications Prior to Visit  Medication Sig Dispense Refill  . Accu-Chek FastClix Lancets MISC Check blood sugar up to 3 times a day 306 each 3  . Acetaminophen 160 MG PACK Take by mouth.    Marland Kitchen albuterol (VENTOLIN HFA) 108 (90 Base) MCG/ACT inhaler Inhale 2 puffs into the lungs every 4 (four) hours as needed for wheezing or shortness of breath.    Marland Kitchen amLODipine (NORVASC) 5 MG tablet Take 5 mg by mouth at bedtime.    . B Complex-C-Folic Acid (RENA-VITE RX) 1 MG TABS Take 1 tablet by mouth daily.    . bictegravir-emtricitabine-tenofovir AF (BIKTARVY)  50-200-25 MG TABS tablet Take 1 tablet by mouth daily. Take one tab by mouth daily. 30 tablet 0  . calcium acetate (PHOSLO) 667 MG capsule Take 2 capsules (1,334 mg total) by mouth 3 (three) times daily with meals. 180 capsule 2  . Cholecalciferol (VITAMIN D HIGH POTENCY PO) Take by mouth.    . diphenhydrAMINE (BENADRYL) 50 MG tablet Take 0.5 tablets (25 mg total) by mouth every 6 (six) hours as needed for itching (As needed for itching). 30 tablet 0  . DIPHENHYDRAMINE-ACETAMINOPHEN PO Take by mouth.    . ethyl chloride spray Apply topically.    . gabapentin (NEURONTIN) 300 MG capsule Take 1 capsule (300 mg total) by mouth daily as needed. 90 capsule 0  . glucose blood (ACCU-CHEK SMARTVIEW) test strip Check blood sugar 4 times a day as instructed 125 each 12  . HEPARIN SODIUM, PORCINE, IV Heparin Sodium (Porcine) 1,000 Units/mL Systemic    . insulin glargine (LANTUS) 100 UNIT/ML Solostar Pen Inject 5 Units into the skin at bedtime. 3 mL 0  . iron sucrose in sodium chloride 0.9 % 100 mL Iron Sucrose (Venofer)    . lidocaine-prilocaine (EMLA) cream Apply topically 2 (two) times daily as needed. 30 g 0  . moxifloxacin (VIGAMOX) 0.5 % ophthalmic solution     . ondansetron (ZOFRAN ODT) 4 MG disintegrating tablet Take 1 tablet (4 mg total) by mouth every 8 (eight) hours as needed for nausea or vomiting. 20 tablet 0  . prednisoLONE acetate (PRED FORTE) 1 % ophthalmic suspension INSTILL 1 DROP INTO RIGHT EYE 4 TIMES DAILY FOR 7 DAYS    . promethazine (PHENERGAN) 25 MG tablet Take 25 mg by mouth 3 (three) times daily as needed.    . sertraline (ZOLOFT) 50 MG tablet Take 1 tablet (50 mg total) by mouth daily. 90 tablet 3  . silver sulfADIAZINE (SILVADENE) 1 % cream Apply pea-sized amount to wound daily. 50 g 0  . TUMS 500 MG chewable tablet Chew 2 tablets by mouth at bedtime.     No facility-administered medications prior to visit.    ROS   Objective:  There were no vitals filed for this visit.     Physical Exam: General: Well-appearing, no acute distress HENT: [place], AT, OP clear, MMM Eyes: EOMI, no scleral icterus Respiratory: Clear to auscultation bilaterally.  No crackles, wheezing or rales Cardiovascular: RRR, -M/R/G, no JVD GI: BS+, soft, nontender Extremities:-Edema,-tenderness Neuro: AAO x4, CNII-XII grossly intact Skin: Intact, no rashes or bruising Psych: Normal mood, normal affect  Data Reviewed:  Imaging:  PFT:  Labs:      Assessment & Plan:   Discussion: ***  Health Maintenance Immunization History  Administered Date(s) Administered  . Hepatitis B, adult 05/27/2016, 07/08/2016, 05/25/2019, 06/22/2019, 07/20/2019  . Influenza,inj,Quad PF,6+ Mos 04/21/2016,  05/17/2017, 10/09/2018, 05/29/2020  . Meningococcal Mcv4o 07/08/2016, 10/07/2016  . Pneumococcal Conjugate-13 01/05/2018  . Pneumococcal Polysaccharide-23 11/16/2013  . Tdap 09/17/2017   CT Lung Screen***  No orders of the defined types were placed in this encounter. No orders of the defined types were placed in this encounter.   No follow-ups on file.  I have spent a total time of***-minutes on the day of the appointment reviewing prior documentation, coordinating care and discussing medical diagnosis and plan with the patient/family. Imaging, labs and tests included in this note have been reviewed and interpreted independently by me.  Marion, MD [facility] 09/23/2020 10:13 AM  Office Number [phone]

## 2020-10-04 ENCOUNTER — Telehealth: Payer: Self-pay

## 2020-10-04 NOTE — Telephone Encounter (Signed)
Requesting to speak with a nurse about wheelchair. Please call pt back.

## 2020-10-04 NOTE — Telephone Encounter (Signed)
Return pt's call - stated she has not heard anything from about her w/c; stated she has mentioned this about a month ago.

## 2020-10-07 NOTE — Telephone Encounter (Signed)
Theophilus Bones, RN; Skeet Latch; [place], [place]; [place], [place]; [place], [place]; 1 other   take it to one of our retail locations and have them look/repair it   Relayed this info to patient and gave her the number to the [employer] [phone]) and to [place], [place] [facility], at [phone]. Patient is very Patent attorney.

## 2020-10-07 NOTE — Telephone Encounter (Signed)
Spoke with patient. 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 Following CM received from [employer] on 08/16/2020:  From: [place], [place] Sent: 08/16/2020  10:45 AM EST To: Darlina Guys, Jaquita Rector, * Subject: RE: Rolling walker                             order created and sent to processor team for follow up.  Thanks.  CM sent today to [facility] [place] for status update.  Also, on 08/16/2020 order, demographics, [place] card, and OV notes from 08/14/2020 addressing need for scooter faxed to [facility] and [employer] at [phone]. Fax confirmation receipt received  Call placed to [facility] [place] [phone]). States scooters have been on manufacturer's back order for months. They are not notifying patients of this. They have no estimated time of delivery from [facility] and they are not keeping order requests on file. States orders that were placed Summer 2021 have still not come in. Attempted to relay this information to patient. No answer and no VM set up.

## 2020-10-07 NOTE — Telephone Encounter (Signed)
Patient returned call. Explained below regarding walker and scooter. States her walker has a wheel that doesn't touch the ground which makes it wobbly and she feels unsafe using it. CM sent back to Leah at [employer] to see if there is anything she can do.  Patient was given number to [facility] and [employer] so she can check in with them from time to time to see if scooters come back in stock.

## 2020-10-07 NOTE — Telephone Encounter (Signed)
Dagmar Hait, Orvis Brill, RN; [place], [place] Savin; [place], [place]; [place], [place]; 1 other   This was processed for the patient on 08/16/20. However, the patient received the same equipment from [employer] on 10/16/2018 and her insurance will not pay for another one. Patient did not want to private pay for equipment.

## 2020-10-08 DIAGNOSIS — Z992 Dependence on renal dialysis: Secondary | ICD-10-CM | POA: Diagnosis not present

## 2020-10-08 DIAGNOSIS — D509 Iron deficiency anemia, unspecified: Secondary | ICD-10-CM | POA: Diagnosis not present

## 2020-10-08 DIAGNOSIS — D689 Coagulation defect, unspecified: Secondary | ICD-10-CM | POA: Diagnosis not present

## 2020-10-08 DIAGNOSIS — N2581 Secondary hyperparathyroidism of renal origin: Secondary | ICD-10-CM | POA: Diagnosis not present

## 2020-10-08 DIAGNOSIS — N186 End stage renal disease: Secondary | ICD-10-CM | POA: Diagnosis not present

## 2020-10-08 DIAGNOSIS — L299 Pruritus, unspecified: Secondary | ICD-10-CM | POA: Diagnosis not present

## 2020-10-10 ENCOUNTER — Telehealth (INDEPENDENT_AMBULATORY_CARE_PROVIDER_SITE_OTHER): Payer: Medicare Other | Admitting: Internal Medicine

## 2020-10-10 ENCOUNTER — Other Ambulatory Visit: Payer: Self-pay

## 2020-10-10 DIAGNOSIS — D689 Coagulation defect, unspecified: Secondary | ICD-10-CM | POA: Diagnosis not present

## 2020-10-10 DIAGNOSIS — L299 Pruritus, unspecified: Secondary | ICD-10-CM | POA: Diagnosis not present

## 2020-10-10 DIAGNOSIS — B20 Human immunodeficiency virus [HIV] disease: Secondary | ICD-10-CM | POA: Diagnosis not present

## 2020-10-10 DIAGNOSIS — Z992 Dependence on renal dialysis: Secondary | ICD-10-CM | POA: Diagnosis not present

## 2020-10-10 DIAGNOSIS — N2581 Secondary hyperparathyroidism of renal origin: Secondary | ICD-10-CM | POA: Diagnosis not present

## 2020-10-10 DIAGNOSIS — N186 End stage renal disease: Secondary | ICD-10-CM | POA: Diagnosis not present

## 2020-10-10 DIAGNOSIS — D509 Iron deficiency anemia, unspecified: Secondary | ICD-10-CM | POA: Diagnosis not present

## 2020-10-10 MED ORDER — BIKTARVY 50-200-25 MG PO TABS: 1.0000 | ORAL_TABLET | Freq: Every day | ORAL | 11 refills | Status: DC

## 2020-10-10 NOTE — Progress Notes (Signed)
Virtual Visit via Telephone Note  I connected with Kathy Frank on 10/10/20 at  9:00 AM EST by telephone and verified that I am speaking with the correct person using two identifiers.  Location: Patient: Currently at hemodialysis Provider: [facility]   I discussed the limitations, risks, security and privacy concerns of performing an evaluation and management service by telephone and the availability of in person appointments. I also discussed with the patient that there may be a patient responsible charge related to this service. The patient expressed understanding and agreed to proceed.   History of Present Illness: I called and spoke with Kathy Frank today.  She says that she missed 2 days of Biktarvy when her pharmacy was late filling her prescription.  Otherwise she has not had any problems taking it.  She is still struggling with anxiety and panic attacks.  She tells me that after her last visit here there was an attempt to arrange a video visit with our counselor but that never occurred.  She says that no one called her back.   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  She will continue Biktarvy and follow-up in 3 months for repeat blood work.  We will make arrangements for her to have a video visit with our behavioral health counselor.  Follow Up Instructions: Continue Biktarvy Video visit for behavioral health counseling Follow-up with me in 3 months   I discussed the assessment and treatment plan with the patient. The patient was provided an opportunity to ask questions and all were answered. The patient agreed with the plan and  demonstrated an understanding of the instructions.   The patient was advised to call back or seek an in-person evaluation if the symptoms worsen or if the condition fails to improve as anticipated.  I provided 15 minutes of non-face-to-face time during this encounter.   Michel Bickers, MD

## 2020-10-12 DIAGNOSIS — N2581 Secondary hyperparathyroidism of renal origin: Secondary | ICD-10-CM | POA: Diagnosis not present

## 2020-10-12 DIAGNOSIS — N186 End stage renal disease: Secondary | ICD-10-CM | POA: Diagnosis not present

## 2020-10-12 DIAGNOSIS — D509 Iron deficiency anemia, unspecified: Secondary | ICD-10-CM | POA: Diagnosis not present

## 2020-10-12 DIAGNOSIS — L299 Pruritus, unspecified: Secondary | ICD-10-CM | POA: Diagnosis not present

## 2020-10-12 DIAGNOSIS — D689 Coagulation defect, unspecified: Secondary | ICD-10-CM | POA: Diagnosis not present

## 2020-10-12 DIAGNOSIS — Z992 Dependence on renal dialysis: Secondary | ICD-10-CM | POA: Diagnosis not present

## 2020-10-15 DIAGNOSIS — Z992 Dependence on renal dialysis: Secondary | ICD-10-CM | POA: Diagnosis not present

## 2020-10-15 DIAGNOSIS — D689 Coagulation defect, unspecified: Secondary | ICD-10-CM | POA: Diagnosis not present

## 2020-10-15 DIAGNOSIS — D509 Iron deficiency anemia, unspecified: Secondary | ICD-10-CM | POA: Diagnosis not present

## 2020-10-15 DIAGNOSIS — N2581 Secondary hyperparathyroidism of renal origin: Secondary | ICD-10-CM | POA: Diagnosis not present

## 2020-10-15 DIAGNOSIS — N186 End stage renal disease: Secondary | ICD-10-CM | POA: Diagnosis not present

## 2020-10-15 DIAGNOSIS — R519 Headache, unspecified: Secondary | ICD-10-CM | POA: Diagnosis not present

## 2020-10-17 ENCOUNTER — Other Ambulatory Visit: Payer: Self-pay | Admitting: Internal Medicine

## 2020-10-17 ENCOUNTER — Telehealth: Payer: Self-pay

## 2020-10-17 DIAGNOSIS — D509 Iron deficiency anemia, unspecified: Secondary | ICD-10-CM | POA: Diagnosis not present

## 2020-10-17 DIAGNOSIS — D689 Coagulation defect, unspecified: Secondary | ICD-10-CM | POA: Diagnosis not present

## 2020-10-17 DIAGNOSIS — Z992 Dependence on renal dialysis: Secondary | ICD-10-CM | POA: Diagnosis not present

## 2020-10-17 DIAGNOSIS — N2581 Secondary hyperparathyroidism of renal origin: Secondary | ICD-10-CM | POA: Diagnosis not present

## 2020-10-17 DIAGNOSIS — R519 Headache, unspecified: Secondary | ICD-10-CM | POA: Diagnosis not present

## 2020-10-17 DIAGNOSIS — N186 End stage renal disease: Secondary | ICD-10-CM | POA: Diagnosis not present

## 2020-10-17 DIAGNOSIS — B20 Human immunodeficiency virus [HIV] disease: Secondary | ICD-10-CM

## 2020-10-17 MED ORDER — BIKTARVY 50-200-25 MG PO TABS: 1.0000 | ORAL_TABLET | Freq: Every day | ORAL | 3 refills | Status: DC

## 2020-10-17 NOTE — Telephone Encounter (Signed)
Received notification from [facility] that they will no longer be able to fill the patient's Biktarvy. Per [facility] pharmacy staff, Biktarvy should be sent to [facility]. RN spoke to patient to make her aware of change and provided her with [facility] phone number so that she can set up delivery. Patient verbalized understanding and has no further questions.   Beryle Flock, RN

## 2020-10-19 DIAGNOSIS — Z992 Dependence on renal dialysis: Secondary | ICD-10-CM | POA: Diagnosis not present

## 2020-10-19 DIAGNOSIS — D689 Coagulation defect, unspecified: Secondary | ICD-10-CM | POA: Diagnosis not present

## 2020-10-19 DIAGNOSIS — N186 End stage renal disease: Secondary | ICD-10-CM | POA: Diagnosis not present

## 2020-10-19 DIAGNOSIS — N2581 Secondary hyperparathyroidism of renal origin: Secondary | ICD-10-CM | POA: Diagnosis not present

## 2020-10-19 DIAGNOSIS — D509 Iron deficiency anemia, unspecified: Secondary | ICD-10-CM | POA: Diagnosis not present

## 2020-10-19 DIAGNOSIS — R519 Headache, unspecified: Secondary | ICD-10-CM | POA: Diagnosis not present

## 2020-10-21 ENCOUNTER — Other Ambulatory Visit: Payer: Self-pay

## 2020-10-21 ENCOUNTER — Encounter (HOSPITAL_BASED_OUTPATIENT_CLINIC_OR_DEPARTMENT_OTHER): Payer: Self-pay | Admitting: General Surgery

## 2020-10-21 ENCOUNTER — Other Ambulatory Visit (HOSPITAL_COMMUNITY)
Admission: RE | Admit: 2020-10-21 | Discharge: 2020-10-21 | Disposition: A | Discharge status: Home / Self Care | Payer: Medicare Other | Source: Ambulatory Visit | Attending: General Surgery | Admitting: General Surgery

## 2020-10-21 DIAGNOSIS — Z01812 Encounter for preprocedural laboratory examination: Secondary | ICD-10-CM | POA: Insufficient documentation

## 2020-10-21 DIAGNOSIS — Z20822 Contact with and (suspected) exposure to covid-19: Secondary | ICD-10-CM | POA: Diagnosis not present

## 2020-10-21 LAB — SARS CORONAVIRUS 2 (TAT 6-24 HRS): SARS Coronavirus 2: NEGATIVE

## 2020-10-21 NOTE — Progress Notes (Addendum)
Spoke w/ via phone for pre-op interview---pt Lab needs dos---- I stat               Lab results------see below COVID test ------10-21-2020 900 Arrive at -------630 am 10-24-2020 NPO after MN NO Solid Food.  Clear liquids from MN until---530 am then npo Med rec completed Medications to take morning of surgery -----biktarvy, ventolin inhaler prn/bring inhaler,  Diabetic medication -----take 1/2 dose of hs lantus on 10-23-2020 Patient instructed to bring photo id and insurance card day of surgery Patient aware to have Driver (ride ) / caregiver  Daughter Marylyn Ishihara will stay   for 24 hours after surgery  Patient Special Instructions -----none Pre-Op special Istructions -----none Patient verbalized understanding of instructions that were given at this phone interview. Patient denies shortness of breath, chest pain, fever, cough at this phone interview.   Patient with sob using 2 liter per minute at Tuesday Thursday, Saturday dialysis treatment, pt states having sob at home when lying down must sleep on 2 regular and 2 small pillows at night . Has pulmonary consult 11-11-2020 for osb and oxygen use. Pt states no cardiac S & S or chest pain at pre op call.pt does not do house work/chores luves with mother and  uses walker in house and wheelchair for long distances.   Chart reviewed with dr Candida Peeling anesthesia  Stated ok for [facility] barring any acute status changes day of surgery.  Infectious disease lov dr Megan Salon 10-10-2020 epic ekg 06-17-2020 epic Echo 11-26-2018 epic Chest xray 06-17-2020 epic pcp lov dr [name] liang 08-14-2020 epic  Pt uses wheelchair for distance, is left aka transfers independently per pt

## 2020-10-22 DIAGNOSIS — N2581 Secondary hyperparathyroidism of renal origin: Secondary | ICD-10-CM | POA: Diagnosis not present

## 2020-10-22 DIAGNOSIS — Z992 Dependence on renal dialysis: Secondary | ICD-10-CM | POA: Diagnosis not present

## 2020-10-22 DIAGNOSIS — D689 Coagulation defect, unspecified: Secondary | ICD-10-CM | POA: Diagnosis not present

## 2020-10-22 DIAGNOSIS — L299 Pruritus, unspecified: Secondary | ICD-10-CM | POA: Diagnosis not present

## 2020-10-22 DIAGNOSIS — D509 Iron deficiency anemia, unspecified: Secondary | ICD-10-CM | POA: Diagnosis not present

## 2020-10-22 DIAGNOSIS — N186 End stage renal disease: Secondary | ICD-10-CM | POA: Diagnosis not present

## 2020-10-24 ENCOUNTER — Ambulatory Visit (HOSPITAL_BASED_OUTPATIENT_CLINIC_OR_DEPARTMENT_OTHER): Payer: Medicare Other | Admitting: Anesthesiology

## 2020-10-24 ENCOUNTER — Encounter (HOSPITAL_BASED_OUTPATIENT_CLINIC_OR_DEPARTMENT_OTHER)
Admission: RE | Disposition: A | Discharge status: Home / Self Care | Payer: Self-pay | Source: Home / Self Care | Attending: General Surgery

## 2020-10-24 ENCOUNTER — Ambulatory Visit (HOSPITAL_BASED_OUTPATIENT_CLINIC_OR_DEPARTMENT_OTHER)
Admission: RE | Admit: 2020-10-24 | Discharge: 2020-10-24 | Disposition: A | Discharge status: Home / Self Care | Payer: Medicare Other | Attending: General Surgery | Admitting: General Surgery

## 2020-10-24 ENCOUNTER — Encounter (HOSPITAL_BASED_OUTPATIENT_CLINIC_OR_DEPARTMENT_OTHER): Payer: Self-pay | Admitting: General Surgery

## 2020-10-24 DIAGNOSIS — Z21 Asymptomatic human immunodeficiency virus [HIV] infection status: Secondary | ICD-10-CM | POA: Diagnosis not present

## 2020-10-24 DIAGNOSIS — I700 Atherosclerosis of aorta: Secondary | ICD-10-CM | POA: Diagnosis not present

## 2020-10-24 DIAGNOSIS — L732 Hidradenitis suppurativa: Secondary | ICD-10-CM | POA: Diagnosis not present

## 2020-10-24 DIAGNOSIS — Z899 Acquired absence of limb, unspecified: Secondary | ICD-10-CM | POA: Diagnosis not present

## 2020-10-24 DIAGNOSIS — E1122 Type 2 diabetes mellitus with diabetic chronic kidney disease: Secondary | ICD-10-CM | POA: Insufficient documentation

## 2020-10-24 DIAGNOSIS — K610 Anal abscess: Secondary | ICD-10-CM | POA: Insufficient documentation

## 2020-10-24 DIAGNOSIS — N186 End stage renal disease: Secondary | ICD-10-CM | POA: Insufficient documentation

## 2020-10-24 DIAGNOSIS — K219 Gastro-esophageal reflux disease without esophagitis: Secondary | ICD-10-CM | POA: Diagnosis not present

## 2020-10-24 HISTORY — DX: Constipation, unspecified: K59.00

## 2020-10-24 HISTORY — DX: Anxiety disorder, unspecified: F41.9

## 2020-10-24 HISTORY — PX: INCISION AND DRAINAGE ABSCESS: SHX5864

## 2020-10-24 HISTORY — DX: Personal history of other diseases of the circulatory system: Z86.79

## 2020-10-24 HISTORY — DX: Anal abscess: K61.0

## 2020-10-24 HISTORY — DX: Presence of dental prosthetic device (complete) (partial): Z97.2

## 2020-10-24 HISTORY — PX: RECTAL EXAM UNDER ANESTHESIA: SHX6399

## 2020-10-24 LAB — POCT I-STAT, CHEM 8
BUN: 54 mg/dL — ABNORMAL HIGH (ref 6–20)
Calcium, Ion: 1.07 mmol/L — ABNORMAL LOW (ref 1.15–1.40)
Chloride: 102 mmol/L (ref 98–111)
Creatinine, Ser: 10 mg/dL — ABNORMAL HIGH (ref 0.44–1.00)
Glucose, Bld: 120 mg/dL — ABNORMAL HIGH (ref 70–99)
HCT: 33 % — ABNORMAL LOW (ref 36.0–46.0)
Hemoglobin: 11.2 g/dL — ABNORMAL LOW (ref 12.0–15.0)
Potassium: 5.3 mmol/L — ABNORMAL HIGH (ref 3.5–5.1)
Sodium: 134 mmol/L — ABNORMAL LOW (ref 135–145)
TCO2: 25 mmol/L (ref 22–32)

## 2020-10-24 LAB — GLUCOSE, CAPILLARY: Glucose-Capillary: 119 mg/dL — ABNORMAL HIGH (ref 70–99)

## 2020-10-24 LAB — HCG, SERUM, QUALITATIVE: Preg, Serum: NEGATIVE

## 2020-10-24 SURGERY — INCISION AND DRAINAGE, ABSCESS
Anesthesia: Monitor Anesthesia Care | Site: Anus

## 2020-10-24 MED ORDER — PROPOFOL 500 MG/50ML IV EMUL
INTRAVENOUS | Status: DC | PRN
  Administered 2020-10-24: 200 ug/kg/min via INTRAVENOUS

## 2020-10-24 MED ORDER — MIDAZOLAM HCL 5 MG/5ML IJ SOLN
INTRAMUSCULAR | Status: DC | PRN
  Administered 2020-10-24: 2 mg via INTRAVENOUS

## 2020-10-24 MED ORDER — FENTANYL CITRATE (PF) 100 MCG/2ML IJ SOLN
INTRAMUSCULAR | Status: DC | PRN
  Administered 2020-10-24: 50 ug via INTRAVENOUS

## 2020-10-24 MED ORDER — SODIUM CHLORIDE 0.9% FLUSH: 3.0000 mL | Freq: Two times a day (BID) | INTRAVENOUS | Status: DC

## 2020-10-24 MED ORDER — ONDANSETRON HCL 4 MG/2ML IJ SOLN
INTRAMUSCULAR | Status: DC | PRN
  Administered 2020-10-24: 4 mg via INTRAVENOUS

## 2020-10-24 MED ORDER — HYDRALAZINE HCL 20 MG/ML IJ SOLN: 10.0000 mg | INTRAMUSCULAR | Status: DC | PRN

## 2020-10-24 MED ORDER — SODIUM CHLORIDE 0.9 % IV SOLN: INTRAVENOUS | Status: DC

## 2020-10-24 MED ORDER — ACETAMINOPHEN 500 MG PO TABS
ORAL_TABLET | ORAL | Status: AC
  Filled 2020-10-24: qty 2

## 2020-10-24 MED ORDER — OXYCODONE HCL 5 MG PO TABS: 5.0000 mg | ORAL_TABLET | Freq: Four times a day (QID) | ORAL | 0 refills | Status: DC | PRN

## 2020-10-24 MED ORDER — KETAMINE HCL 10 MG/ML IJ SOLN
INTRAMUSCULAR | Status: DC | PRN
  Administered 2020-10-24 (×2): 10 mg via INTRAVENOUS

## 2020-10-24 MED ORDER — ONDANSETRON HCL 4 MG/2ML IJ SOLN
INTRAMUSCULAR | Status: AC
  Filled 2020-10-24: qty 2

## 2020-10-24 MED ORDER — MIDAZOLAM HCL 2 MG/2ML IJ SOLN
INTRAMUSCULAR | Status: AC
  Filled 2020-10-24: qty 2

## 2020-10-24 MED ORDER — PROPOFOL 500 MG/50ML IV EMUL
INTRAVENOUS | Status: AC
  Filled 2020-10-24: qty 50

## 2020-10-24 MED ORDER — FENTANYL CITRATE (PF) 100 MCG/2ML IJ SOLN
INTRAMUSCULAR | Status: AC
  Filled 2020-10-24: qty 2

## 2020-10-24 MED ORDER — ACETAMINOPHEN 500 MG PO TABS
1000.0000 mg | ORAL_TABLET | ORAL | Status: AC
  Administered 2020-10-24: 1000 mg via ORAL

## 2020-10-24 MED ORDER — BUPIVACAINE-EPINEPHRINE 0.5% -1:200000 IJ SOLN
INTRAMUSCULAR | Status: DC | PRN
  Administered 2020-10-24: 25 mL

## 2020-10-24 MED ORDER — PROPOFOL 10 MG/ML IV BOLUS
INTRAVENOUS | Status: AC
  Filled 2020-10-24: qty 20

## 2020-10-24 MED ORDER — PROPOFOL 10 MG/ML IV BOLUS
INTRAVENOUS | Status: DC | PRN
  Administered 2020-10-24: 30 mg via INTRAVENOUS

## 2020-10-24 MED ORDER — KETAMINE HCL 50 MG/5ML IJ SOSY
PREFILLED_SYRINGE | INTRAMUSCULAR | Status: AC
  Filled 2020-10-24: qty 5

## 2020-10-24 SURGICAL SUPPLY — 53 items
APL SKNCLS STERI-STRIP NONHPOA (GAUZE/BANDAGES/DRESSINGS) ×2
BENZOIN TINCTURE PRP APPL 2/3 (GAUZE/BANDAGES/DRESSINGS) ×3 IMPLANT
BLADE EXTENDED COATED 6.5IN (ELECTRODE) IMPLANT
BLADE HEX COATED 2.75 (ELECTRODE) ×3 IMPLANT
BLADE SURG 10 STRL SS (BLADE) IMPLANT
BLADE SURG 15 STRL LF DISP TIS (BLADE) IMPLANT
BLADE SURG 15 STRL SS (BLADE) ×3
BRIEF STRETCH FOR OB PAD LRG (UNDERPADS AND DIAPERS) ×3 IMPLANT
CANISTER SUCT 3000ML PPV (MISCELLANEOUS) ×3 IMPLANT
COVER BACK TABLE 60X90IN (DRAPES) ×3 IMPLANT
COVER MAYO STAND STRL (DRAPES) ×3 IMPLANT
COVER WAND RF STERILE (DRAPES) ×3 IMPLANT
DRAPE HYSTEROSCOPY (MISCELLANEOUS) IMPLANT
DRAPE LAPAROTOMY 100X72 PEDS (DRAPES) ×3 IMPLANT
DRAPE SHEET LG 3/4 BI-LAMINATE (DRAPES) IMPLANT
DRAPE UTILITY XL STRL (DRAPES) ×3 IMPLANT
DRSG PAD ABDOMINAL 8X10 ST (GAUZE/BANDAGES/DRESSINGS) ×3 IMPLANT
ELECT REM PT RETURN 9FT ADLT (ELECTROSURGICAL) ×3
ELECTRODE REM PT RTRN 9FT ADLT (ELECTROSURGICAL) ×2 IMPLANT
GAUZE SPONGE 4X4 12PLY STRL (GAUZE/BANDAGES/DRESSINGS) ×1 IMPLANT
GLOVE SURG ENC MOIS LTX SZ6.5 (GLOVE) ×3 IMPLANT
GLOVE SURG UNDER POLY LF SZ7 (GLOVE) ×3 IMPLANT
GOWN STRL REUS W/TWL XL LVL3 (GOWN DISPOSABLE) ×3 IMPLANT
HYDROGEN PEROXIDE 16OZ (MISCELLANEOUS) IMPLANT
IV CATH 14GX2 1/4 (CATHETERS) IMPLANT
IV CATH 18G SAFETY (IV SOLUTION) IMPLANT
KIT SIGMOIDOSCOPE (SET/KITS/TRAYS/PACK) IMPLANT
KIT TURNOVER CYSTO (KITS) ×3 IMPLANT
LEGGING LITHOTOMY PAIR STRL (DRAPES) IMPLANT
LOOP VESSEL MAXI BLUE (MISCELLANEOUS) ×3 IMPLANT
NEEDLE HYPO 22GX1.5 SAFETY (NEEDLE) ×3 IMPLANT
NS IRRIG 500ML POUR BTL (IV SOLUTION) ×3 IMPLANT
PACK BASIN DAY SURGERY FS (CUSTOM PROCEDURE TRAY) ×3 IMPLANT
PAD ARMBOARD 7.5X6 YLW CONV (MISCELLANEOUS) ×3 IMPLANT
PAD PREP 24X48 CUFFED NSTRL (MISCELLANEOUS) IMPLANT
PENCIL SMOKE EVACUATOR (MISCELLANEOUS) ×3 IMPLANT
SPONGE HEMORRHOID 8X3CM (HEMOSTASIS) IMPLANT
SPONGE SURGIFOAM ABS GEL 100 (HEMOSTASIS) IMPLANT
SPONGE SURGIFOAM ABS GEL 12-7 (HEMOSTASIS) IMPLANT
SUT CHROMIC 2 0 SH (SUTURE) IMPLANT
SUT CHROMIC 3 0 SH 27 (SUTURE) IMPLANT
SUT ETHIBOND 0 (SUTURE) ×3 IMPLANT
SUT VIC AB 2-0 SH 27 (SUTURE)
SUT VIC AB 2-0 SH 27XBRD (SUTURE) IMPLANT
SUT VIC AB 3-0 SH 27 (SUTURE)
SUT VIC AB 3-0 SH 27XBRD (SUTURE) IMPLANT
SYR 10ML LL (SYRINGE) IMPLANT
SYR CONTROL 10ML LL (SYRINGE) ×3 IMPLANT
TOWEL OR 17X26 10 PK STRL BLUE (TOWEL DISPOSABLE) ×3 IMPLANT
TRAY DSU PREP LF (CUSTOM PROCEDURE TRAY) ×3 IMPLANT
TUBE CONNECTING 12X1/4 (SUCTIONS) ×3 IMPLANT
UNDERPAD 30X36 HEAVY ABSORB (UNDERPADS AND DIAPERS) ×3 IMPLANT
YANKAUER SUCT BULB TIP NO VENT (SUCTIONS) ×3 IMPLANT

## 2020-10-24 NOTE — Anesthesia Preprocedure Evaluation (Addendum)
Anesthesia Evaluation  Patient identified by MRN, date of birth, ID band Patient awake    Reviewed: Allergy & Precautions, NPO status , Patient's Chart, lab work & pertinent test results  History of Anesthesia Complications Negative for: history of anesthetic complications  Airway Mallampati: II  TM Distance: >3 FB Neck ROM: Full    Dental  (+) Poor Dentition, Missing, Chipped, Dental Advisory Given   Pulmonary COPD,  COPD inhaler, Current Smoker and Patient abstained from smoking., former smoker,    breath sounds clear to auscultation       Cardiovascular hypertension, Pt. on medications (-) angina+ Peripheral Vascular Disease   Rhythm:Regular Rate:Normal  11/2018 ECHO: EF 60-65%, valves OK   Neuro/Psych  Headaches, PSYCHIATRIC DISORDERS Anxiety Depression    GI/Hepatic GERD  Controlled,(+)     substance abuse  marijuana use,   Endo/Other  diabetes, Insulin Dependent  Renal/GU ESRF and DialysisRenal disease (T/th/Sa; Last dialyzed 3/15; plan for dialysis on 3/18 AM)Baseline K+ 5.0     Musculoskeletal negative musculoskeletal ROS (+)   Abdominal Normal abdominal exam  (+)   Peds  Hematology  (+) Blood dyscrasia, anemia , HIV,   Anesthesia Other Findings   Reproductive/Obstetrics negative OB ROS                           Anesthesia Physical  Anesthesia Plan  ASA: IV  Anesthesia Plan: MAC   Post-op Pain Management:    Induction: Intravenous  PONV Risk Score and Plan: 2 and Treatment may vary due to age or medical condition, Propofol infusion and TIVA  Airway Management Planned: Natural Airway and Simple Face Mask  Additional Equipment: None  Intra-op Plan:   Post-operative Plan:   Informed Consent: I have reviewed the patients History and Physical, chart, labs and discussed the procedure including the risks, benefits and alternatives for the proposed anesthesia with the  patient or authorized representative who has indicated his/her understanding and acceptance.     Dental advisory given  Plan Discussed with: CRNA, Surgeon and Anesthesiologist  Anesthesia Plan Comments:        Anesthesia Quick Evaluation

## 2020-10-24 NOTE — Transfer of Care (Signed)
Immediate Anesthesia Transfer of Care Note  Patient: Kathy Frank  Procedure(s) Performed: exicision of hydradenitis (N/A Anus) EXAM UNDER ANESTHESIA (N/A )  Patient Location: PACU  Anesthesia Type:MAC  Level of Consciousness: awake, alert , oriented and patient cooperative  Airway & Oxygen Therapy: Patient Spontanous Breathing and Patient connected to face mask oxygen  Post-op Assessment: Report given to RN and Post -op Vital signs reviewed and stable  Post vital signs: Reviewed and stable  Last Vitals:  Vitals Value Taken Time  BP    Temp    Pulse 98 10/24/20 0856  Resp 21 10/24/20 0856  SpO2 100 % 10/24/20 0856  Vitals shown include unvalidated device data.  Last Pain:  Vitals:   10/24/20 0730  TempSrc: Oral  PainSc: 0-No pain      Patients Stated Pain Goal: 7 (74/71/59 5396)  Complications: No complications documented.

## 2020-10-24 NOTE — Op Note (Signed)
10/24/2020  8:41 AM  PATIENT:  Kathy Frank  45 y.o. female  Patient Care Team: [name], MD as PCP - General Clent Jacks, MD as Consulting Physician (Ophthalmology) Center[facility]  PRE-OPERATIVE DIAGNOSIS:  anal abscess chronic  POST-OPERATIVE DIAGNOSIS:  Hydradenitis   PROCEDURE:  EXAM UNDER ANESTHESIA EXCISION OF ACTIVE HYDRADENITIS     Surgeon(s): Leighton Ruff, MD  ASSISTANT: none   ANESTHESIA:   local and MAC  SPECIMEN:  Source of Specimen:  R lateral perineum skin  DISPOSITION OF SPECIMEN:  PATHOLOGY  COUNTS:  YES  PLAN OF CARE: Discharge to home after PACU  PATIENT DISPOSITION:  PACU - hemodynamically stable.  INDICATION: 45 y.o. F with chronically draining abscess   OR FINDINGS: chronic hydradenitis, small area of active disease  DESCRIPTION: the patient was identified in the preoperative holding area and taken to the OR where they were laid on the operating room table.  MAC anesthesia was induced without difficulty. The patient was then positioned in prone jackknife position with buttocks gently taped apart.  The patient was then prepped and draped in usual sterile fashion.  SCDs were noted to be in place prior to the initiation of anesthesia. A surgical timeout was performed indicating the correct patient, procedure, positioning and need for preoperative antibiotics.  A rectal block was performed using Marcaine with epinephrine.    I began with a digital rectal exam.  This was normal  I then placed a Hill-Ferguson anoscope into the anal canal and evaluated this completely.  There was no other pathology noted.  Identified the area of inflammation in the right lateral perineum.  I evaluated this with a fistula probe.  There was no sign of perirectal fistula.  The area of inflammation appeared to be hidradenitis.  This was resected and left open.  A dressing was applied.  The patient was awakened from anesthesia and sent to the postanesthesia  care unit in stable condition.

## 2020-10-24 NOTE — Anesthesia Postprocedure Evaluation (Signed)
Anesthesia Post Note  Patient: Kathy Frank  Procedure(s) Performed: exicision of hydradenitis (N/A Anus) EXAM UNDER ANESTHESIA (N/A )     Patient location during evaluation: PACU Anesthesia Type: MAC Level of consciousness: awake and alert Pain management: pain level controlled Vital Signs Assessment: post-procedure vital signs reviewed and stable Respiratory status: spontaneous breathing and respiratory function stable Cardiovascular status: stable Postop Assessment: no apparent nausea or vomiting Anesthetic complications: no   No complications documented.  Last Vitals:  Vitals:   10/24/20 0915 10/24/20 0945  BP: (!) 141/69   Pulse: 95   Resp: 12   Temp:  36.9 C  SpO2: 94%     Last Pain:  Vitals:   10/24/20 0945  TempSrc:   PainSc: 0-No pain                 Merlinda Frederick

## 2020-10-24 NOTE — H&P (Signed)
  The patient is a 45 year old female who presents with a subcutaneous abscess. Past medical history of HIV, type 2 diabetes, ESRD, and LLE amputation, who underwent I&D of right sided perianal abscess in the ER in May 2021. Apparently, the area released a large amount of purulence. She saw Dr. Kae Heller in October 2021 in urgent office. At that time, Dr. Kae Heller believed she had hidradenitis of this area and not necessarily abscesses. She presented 2 months ago to urgent office complaining of continued pain, stating that it never fully resolved. She was started on a trial of antibiotics but that did not change her symptoms. Denies drainage. She reports intermittent swelling and pain in the area.   Problem List/Past Medical Leighton Ruff, MD; 0000000 11:01 AM) PERIANAL LESION (K62.9) PERIRECTAL ABSCESS (K61.1)  Diagnostic Studies History Leighton Ruff, MD; 0000000 11:01 AM) Pap Smear 1-5 years ago  Allergies Leighton Ruff, MD; 0000000 11:01 AM) No Known Drug Allergies [12/22/2019]:  Medication History Leighton Ruff, MD; 0000000 11:01 AM) Albuterol (90MCG/ACT Aerosol Soln, Inhalation) Active. Doxycycline Hyclate ('100MG'$  Tablet, Oral) Active. Insulin Lispro (100UNIT/ML Solution, 5 units Subcutaneous) Active. Promethazine HCl ('25MG'$  Tablet, Oral) Active. Sertraline HCl ('50MG'$  Tablet, Oral) Active. Bictegravir-Emtricitab-Tenofov (50-200-'25MG'$  Tablet, Oral) Active. Ondansetron ('4MG'$  Tablet, Oral) Active.  Pregnancy / Birth History Leighton Ruff, MD; 0000000 11:01 AM) Age at menarche 23 years. Gravida 4 Maternal age 31-20 Para 3 Regular periods  Other Problems Leighton Ruff, MD; 0000000 11:01 AM) Chronic Renal Failure Syndrome Diabetes Mellitus     Review of Systems  All other systems negative   Physical Exam Leighton Ruff MD; 0000000 11:01 AM)  General Mental Status-Alert. General Appearance-Cooperative.  Rectal Note:  Right lateral perianal area of inflammation draining purulence. It is approximately 2 cm in diameter. There are no other lesions noted. There is no palpable cord.    Assessment & Plan   ABSCESS, PERIANAL (K61.0) Impression: Patient appears to have a chronic abscess in her right lateral perianal area. This is either hidradenitis or a small perianal fistula. I have recommended an exam under anesthesia with debridement or fistulotomy or seton placement. Depending on what we find. We discussed that if a seton is placed. She will need to return to the operating room for a second procedure. An approximately 3-4 months. We discussed that if the debridement is performed, she will have a wound, which will most likely heal slowly due to her chronic medical conditions. We also discussed risk of incontinence with fistulotomy and will hold this for only the most superficial fistulas.

## 2020-10-24 NOTE — Discharge Instructions (Addendum)
° °  Beginning the day after surgery: ° °You may sit in a tub of warm water 2-3 times a day to relieve discomfort. ° °Eat a regular diet high in fiber.  Avoid foods that give you constipation or diarrhea.  Avoid foods that are difficult to digest, such as seeds, nuts, corn or popcorn. ° °Do not go any longer than 2 days without a bowel movement.  You may take a dose of Milk of Magnesia if you become constipated.   ° °Drink 6-8 glasses of water daily. ° °Walking is encouraged.  Avoid strenuous activity and heavy lifting for one month after surgery.   ° °Call the office if you have any questions or concerns.  Call immediately if you develop: ° °· Excessive rectal bleeding (more than a cup or passing large clots) °· Increased discomfort °· Fever greater than 100 F °· Difficulty urinating ° ° °Post Anesthesia Home Care Instructions ° °Activity: °Get plenty of rest for the remainder of the day. A responsible individual must stay with you for 24 hours following the procedure.  °For the next 24 hours, DO NOT: °-Drive a car °-Operate machinery °-Drink alcoholic beverages °-Take any medication unless instructed by your physician °-Make any legal decisions or sign important papers. ° °Meals: °Start with liquid foods such as gelatin or soup. Progress to regular foods as tolerated. Avoid greasy, spicy, heavy foods. If nausea and/or vomiting occur, drink only clear liquids until the nausea and/or vomiting subsides. Call your physician if vomiting continues. ° °Special Instructions/Symptoms: °Your throat may feel dry or sore from the anesthesia or the breathing tube placed in your throat during surgery. If this causes discomfort, gargle with warm salt water. The discomfort should disappear within 24 hours. ° °If you had a scopolamine patch placed behind your ear for the management of post- operative nausea and/or vomiting: ° °1. The medication in the patch is effective for 72 hours, after which it should be removed.  Wrap patch in  a tissue and discard in the trash. Wash hands thoroughly with soap and water. °2. You may remove the patch earlier than 72 hours if you experience unpleasant side effects which may include dry mouth, dizziness or visual disturbances. °3. Avoid touching the patch. Wash your hands with soap and water after contact with the patch. °  ° ° °

## 2020-10-25 ENCOUNTER — Encounter (HOSPITAL_BASED_OUTPATIENT_CLINIC_OR_DEPARTMENT_OTHER): Payer: Self-pay | Admitting: General Surgery

## 2020-10-25 DIAGNOSIS — Z992 Dependence on renal dialysis: Secondary | ICD-10-CM | POA: Diagnosis not present

## 2020-10-25 DIAGNOSIS — N186 End stage renal disease: Secondary | ICD-10-CM | POA: Diagnosis not present

## 2020-10-25 DIAGNOSIS — N2581 Secondary hyperparathyroidism of renal origin: Secondary | ICD-10-CM | POA: Diagnosis not present

## 2020-10-25 DIAGNOSIS — D509 Iron deficiency anemia, unspecified: Secondary | ICD-10-CM | POA: Diagnosis not present

## 2020-10-25 DIAGNOSIS — D689 Coagulation defect, unspecified: Secondary | ICD-10-CM | POA: Diagnosis not present

## 2020-10-25 DIAGNOSIS — L299 Pruritus, unspecified: Secondary | ICD-10-CM | POA: Diagnosis not present

## 2020-10-26 DIAGNOSIS — Z992 Dependence on renal dialysis: Secondary | ICD-10-CM | POA: Diagnosis not present

## 2020-10-26 DIAGNOSIS — D509 Iron deficiency anemia, unspecified: Secondary | ICD-10-CM | POA: Diagnosis not present

## 2020-10-26 DIAGNOSIS — L299 Pruritus, unspecified: Secondary | ICD-10-CM | POA: Diagnosis not present

## 2020-10-26 DIAGNOSIS — N2581 Secondary hyperparathyroidism of renal origin: Secondary | ICD-10-CM | POA: Diagnosis not present

## 2020-10-26 DIAGNOSIS — N186 End stage renal disease: Secondary | ICD-10-CM | POA: Diagnosis not present

## 2020-10-26 DIAGNOSIS — D689 Coagulation defect, unspecified: Secondary | ICD-10-CM | POA: Diagnosis not present

## 2020-10-28 ENCOUNTER — Ambulatory Visit: Payer: Medicare Other | Admitting: Podiatry

## 2020-10-29 ENCOUNTER — Other Ambulatory Visit: Payer: Self-pay

## 2020-10-29 ENCOUNTER — Ambulatory Visit: Payer: Medicare Other

## 2020-10-29 DIAGNOSIS — R197 Diarrhea, unspecified: Secondary | ICD-10-CM | POA: Diagnosis not present

## 2020-10-29 DIAGNOSIS — D689 Coagulation defect, unspecified: Secondary | ICD-10-CM | POA: Diagnosis not present

## 2020-10-29 DIAGNOSIS — N186 End stage renal disease: Secondary | ICD-10-CM | POA: Diagnosis not present

## 2020-10-29 DIAGNOSIS — Z992 Dependence on renal dialysis: Secondary | ICD-10-CM | POA: Diagnosis not present

## 2020-10-29 DIAGNOSIS — N2581 Secondary hyperparathyroidism of renal origin: Secondary | ICD-10-CM | POA: Diagnosis not present

## 2020-10-29 DIAGNOSIS — D509 Iron deficiency anemia, unspecified: Secondary | ICD-10-CM | POA: Diagnosis not present

## 2020-10-31 DIAGNOSIS — R197 Diarrhea, unspecified: Secondary | ICD-10-CM | POA: Diagnosis not present

## 2020-10-31 DIAGNOSIS — Z992 Dependence on renal dialysis: Secondary | ICD-10-CM | POA: Diagnosis not present

## 2020-10-31 DIAGNOSIS — N2581 Secondary hyperparathyroidism of renal origin: Secondary | ICD-10-CM | POA: Diagnosis not present

## 2020-10-31 DIAGNOSIS — D689 Coagulation defect, unspecified: Secondary | ICD-10-CM | POA: Diagnosis not present

## 2020-10-31 DIAGNOSIS — D509 Iron deficiency anemia, unspecified: Secondary | ICD-10-CM | POA: Diagnosis not present

## 2020-10-31 DIAGNOSIS — N186 End stage renal disease: Secondary | ICD-10-CM | POA: Diagnosis not present

## 2020-11-01 ENCOUNTER — Other Ambulatory Visit: Payer: Self-pay | Admitting: Internal Medicine

## 2020-11-01 DIAGNOSIS — B20 Human immunodeficiency virus [HIV] disease: Secondary | ICD-10-CM

## 2020-11-01 MED FILL — BIKTARVY 50-200-25 MG TABS: 50-200-25 | 30 days supply | Qty: 30 | Fill #0

## 2020-11-02 DIAGNOSIS — N186 End stage renal disease: Secondary | ICD-10-CM | POA: Diagnosis not present

## 2020-11-02 DIAGNOSIS — Z992 Dependence on renal dialysis: Secondary | ICD-10-CM | POA: Diagnosis not present

## 2020-11-02 DIAGNOSIS — D689 Coagulation defect, unspecified: Secondary | ICD-10-CM | POA: Diagnosis not present

## 2020-11-02 DIAGNOSIS — D509 Iron deficiency anemia, unspecified: Secondary | ICD-10-CM | POA: Diagnosis not present

## 2020-11-02 DIAGNOSIS — R197 Diarrhea, unspecified: Secondary | ICD-10-CM | POA: Diagnosis not present

## 2020-11-02 DIAGNOSIS — N2581 Secondary hyperparathyroidism of renal origin: Secondary | ICD-10-CM | POA: Diagnosis not present

## 2020-11-05 DIAGNOSIS — D509 Iron deficiency anemia, unspecified: Secondary | ICD-10-CM | POA: Diagnosis not present

## 2020-11-05 DIAGNOSIS — N2581 Secondary hyperparathyroidism of renal origin: Secondary | ICD-10-CM | POA: Diagnosis not present

## 2020-11-05 DIAGNOSIS — N186 End stage renal disease: Secondary | ICD-10-CM | POA: Diagnosis not present

## 2020-11-05 DIAGNOSIS — D689 Coagulation defect, unspecified: Secondary | ICD-10-CM | POA: Diagnosis not present

## 2020-11-05 DIAGNOSIS — L299 Pruritus, unspecified: Secondary | ICD-10-CM | POA: Diagnosis not present

## 2020-11-05 DIAGNOSIS — Z992 Dependence on renal dialysis: Secondary | ICD-10-CM | POA: Diagnosis not present

## 2020-11-07 DIAGNOSIS — D509 Iron deficiency anemia, unspecified: Secondary | ICD-10-CM | POA: Diagnosis not present

## 2020-11-07 DIAGNOSIS — D689 Coagulation defect, unspecified: Secondary | ICD-10-CM | POA: Diagnosis not present

## 2020-11-07 DIAGNOSIS — N2581 Secondary hyperparathyroidism of renal origin: Secondary | ICD-10-CM | POA: Diagnosis not present

## 2020-11-07 DIAGNOSIS — N186 End stage renal disease: Secondary | ICD-10-CM | POA: Diagnosis not present

## 2020-11-07 DIAGNOSIS — E1129 Type 2 diabetes mellitus with other diabetic kidney complication: Secondary | ICD-10-CM | POA: Diagnosis not present

## 2020-11-07 DIAGNOSIS — Z992 Dependence on renal dialysis: Secondary | ICD-10-CM | POA: Diagnosis not present

## 2020-11-07 DIAGNOSIS — L299 Pruritus, unspecified: Secondary | ICD-10-CM | POA: Diagnosis not present

## 2020-11-09 DIAGNOSIS — D689 Coagulation defect, unspecified: Secondary | ICD-10-CM | POA: Diagnosis not present

## 2020-11-09 DIAGNOSIS — L299 Pruritus, unspecified: Secondary | ICD-10-CM | POA: Diagnosis not present

## 2020-11-09 DIAGNOSIS — N186 End stage renal disease: Secondary | ICD-10-CM | POA: Diagnosis not present

## 2020-11-09 DIAGNOSIS — N2581 Secondary hyperparathyroidism of renal origin: Secondary | ICD-10-CM | POA: Diagnosis not present

## 2020-11-09 DIAGNOSIS — Z992 Dependence on renal dialysis: Secondary | ICD-10-CM | POA: Diagnosis not present

## 2020-11-11 ENCOUNTER — Institutional Professional Consult (permissible substitution): Payer: Medicaid Other | Admitting: Pulmonary Disease

## 2020-11-12 DIAGNOSIS — N186 End stage renal disease: Secondary | ICD-10-CM | POA: Diagnosis not present

## 2020-11-12 DIAGNOSIS — N2581 Secondary hyperparathyroidism of renal origin: Secondary | ICD-10-CM | POA: Diagnosis not present

## 2020-11-12 DIAGNOSIS — D509 Iron deficiency anemia, unspecified: Secondary | ICD-10-CM | POA: Diagnosis not present

## 2020-11-12 DIAGNOSIS — L299 Pruritus, unspecified: Secondary | ICD-10-CM | POA: Diagnosis not present

## 2020-11-12 DIAGNOSIS — D689 Coagulation defect, unspecified: Secondary | ICD-10-CM | POA: Diagnosis not present

## 2020-11-12 DIAGNOSIS — Z992 Dependence on renal dialysis: Secondary | ICD-10-CM | POA: Diagnosis not present

## 2020-11-12 DIAGNOSIS — D631 Anemia in chronic kidney disease: Secondary | ICD-10-CM | POA: Diagnosis not present

## 2020-11-14 DIAGNOSIS — D509 Iron deficiency anemia, unspecified: Secondary | ICD-10-CM | POA: Diagnosis not present

## 2020-11-14 DIAGNOSIS — N186 End stage renal disease: Secondary | ICD-10-CM | POA: Diagnosis not present

## 2020-11-14 DIAGNOSIS — N2581 Secondary hyperparathyroidism of renal origin: Secondary | ICD-10-CM | POA: Diagnosis not present

## 2020-11-14 DIAGNOSIS — D631 Anemia in chronic kidney disease: Secondary | ICD-10-CM | POA: Diagnosis not present

## 2020-11-14 DIAGNOSIS — D689 Coagulation defect, unspecified: Secondary | ICD-10-CM | POA: Diagnosis not present

## 2020-11-14 DIAGNOSIS — Z992 Dependence on renal dialysis: Secondary | ICD-10-CM | POA: Diagnosis not present

## 2020-11-14 DIAGNOSIS — L299 Pruritus, unspecified: Secondary | ICD-10-CM | POA: Diagnosis not present

## 2020-11-16 DIAGNOSIS — D509 Iron deficiency anemia, unspecified: Secondary | ICD-10-CM | POA: Diagnosis not present

## 2020-11-16 DIAGNOSIS — D689 Coagulation defect, unspecified: Secondary | ICD-10-CM | POA: Diagnosis not present

## 2020-11-16 DIAGNOSIS — L299 Pruritus, unspecified: Secondary | ICD-10-CM | POA: Diagnosis not present

## 2020-11-16 DIAGNOSIS — Z992 Dependence on renal dialysis: Secondary | ICD-10-CM | POA: Diagnosis not present

## 2020-11-16 DIAGNOSIS — N186 End stage renal disease: Secondary | ICD-10-CM | POA: Diagnosis not present

## 2020-11-16 DIAGNOSIS — D631 Anemia in chronic kidney disease: Secondary | ICD-10-CM | POA: Diagnosis not present

## 2020-11-16 DIAGNOSIS — N2581 Secondary hyperparathyroidism of renal origin: Secondary | ICD-10-CM | POA: Diagnosis not present

## 2020-11-18 ENCOUNTER — Telehealth: Payer: Self-pay

## 2020-11-18 NOTE — Telephone Encounter (Signed)
Added patient on per request. HD has been telling her she needs her fistula looked at "for the outlet." Asked if they wanted a procedure she says they just want someone to look at it. She says she is able to get treatments. Denies swelling or pain. Added on for dialysis duplex and PA visit.

## 2020-11-19 ENCOUNTER — Telehealth: Payer: Self-pay

## 2020-11-19 DIAGNOSIS — N2581 Secondary hyperparathyroidism of renal origin: Secondary | ICD-10-CM | POA: Diagnosis not present

## 2020-11-19 DIAGNOSIS — N186 End stage renal disease: Secondary | ICD-10-CM | POA: Diagnosis not present

## 2020-11-19 DIAGNOSIS — Z992 Dependence on renal dialysis: Secondary | ICD-10-CM | POA: Diagnosis not present

## 2020-11-19 DIAGNOSIS — R519 Headache, unspecified: Secondary | ICD-10-CM | POA: Diagnosis not present

## 2020-11-19 DIAGNOSIS — E1110 Type 2 diabetes mellitus with ketoacidosis without coma: Secondary | ICD-10-CM | POA: Diagnosis not present

## 2020-11-19 DIAGNOSIS — D509 Iron deficiency anemia, unspecified: Secondary | ICD-10-CM | POA: Diagnosis not present

## 2020-11-19 DIAGNOSIS — L299 Pruritus, unspecified: Secondary | ICD-10-CM | POA: Diagnosis not present

## 2020-11-19 DIAGNOSIS — D689 Coagulation defect, unspecified: Secondary | ICD-10-CM | POA: Diagnosis not present

## 2020-11-19 DIAGNOSIS — E1129 Type 2 diabetes mellitus with other diabetic kidney complication: Secondary | ICD-10-CM | POA: Diagnosis not present

## 2020-11-19 NOTE — Telephone Encounter (Signed)
RX was already sent via surescrips with return receipt confirmation. SChaplin, RN,BSN

## 2020-11-19 NOTE — Telephone Encounter (Signed)
  glucose blood (ACCU-CHEK SMARTVIEW) test strip, REFILL REQUEST @  [facility] ([place]), [place] - [address] Phone:  [phone]  Fax:  [phone]

## 2020-11-21 ENCOUNTER — Other Ambulatory Visit (HOSPITAL_COMMUNITY): Payer: Self-pay

## 2020-11-21 DIAGNOSIS — L299 Pruritus, unspecified: Secondary | ICD-10-CM | POA: Diagnosis not present

## 2020-11-21 DIAGNOSIS — E1129 Type 2 diabetes mellitus with other diabetic kidney complication: Secondary | ICD-10-CM | POA: Diagnosis not present

## 2020-11-21 DIAGNOSIS — R519 Headache, unspecified: Secondary | ICD-10-CM | POA: Diagnosis not present

## 2020-11-21 DIAGNOSIS — E1110 Type 2 diabetes mellitus with ketoacidosis without coma: Secondary | ICD-10-CM | POA: Diagnosis not present

## 2020-11-21 DIAGNOSIS — D689 Coagulation defect, unspecified: Secondary | ICD-10-CM | POA: Diagnosis not present

## 2020-11-21 DIAGNOSIS — N2581 Secondary hyperparathyroidism of renal origin: Secondary | ICD-10-CM | POA: Diagnosis not present

## 2020-11-21 DIAGNOSIS — D509 Iron deficiency anemia, unspecified: Secondary | ICD-10-CM | POA: Diagnosis not present

## 2020-11-21 DIAGNOSIS — N186 End stage renal disease: Secondary | ICD-10-CM | POA: Diagnosis not present

## 2020-11-21 DIAGNOSIS — Z992 Dependence on renal dialysis: Secondary | ICD-10-CM | POA: Diagnosis not present

## 2020-11-21 MED FILL — Bictegravir-Emtricitabine-Tenofovir AF Tab 50-200-25 MG: ORAL | 30 days supply | Qty: 30 | Fill #0 | Status: AC

## 2020-11-23 DIAGNOSIS — N186 End stage renal disease: Secondary | ICD-10-CM | POA: Diagnosis not present

## 2020-11-23 DIAGNOSIS — Z992 Dependence on renal dialysis: Secondary | ICD-10-CM | POA: Diagnosis not present

## 2020-11-23 DIAGNOSIS — D689 Coagulation defect, unspecified: Secondary | ICD-10-CM | POA: Diagnosis not present

## 2020-11-23 DIAGNOSIS — E1129 Type 2 diabetes mellitus with other diabetic kidney complication: Secondary | ICD-10-CM | POA: Diagnosis not present

## 2020-11-23 DIAGNOSIS — N2581 Secondary hyperparathyroidism of renal origin: Secondary | ICD-10-CM | POA: Diagnosis not present

## 2020-11-23 DIAGNOSIS — D509 Iron deficiency anemia, unspecified: Secondary | ICD-10-CM | POA: Diagnosis not present

## 2020-11-23 DIAGNOSIS — E1110 Type 2 diabetes mellitus with ketoacidosis without coma: Secondary | ICD-10-CM | POA: Diagnosis not present

## 2020-11-23 DIAGNOSIS — L299 Pruritus, unspecified: Secondary | ICD-10-CM | POA: Diagnosis not present

## 2020-11-23 DIAGNOSIS — R519 Headache, unspecified: Secondary | ICD-10-CM | POA: Diagnosis not present

## 2020-11-26 DIAGNOSIS — N2581 Secondary hyperparathyroidism of renal origin: Secondary | ICD-10-CM | POA: Diagnosis not present

## 2020-11-26 DIAGNOSIS — D689 Coagulation defect, unspecified: Secondary | ICD-10-CM | POA: Diagnosis not present

## 2020-11-26 DIAGNOSIS — Z992 Dependence on renal dialysis: Secondary | ICD-10-CM | POA: Diagnosis not present

## 2020-11-26 DIAGNOSIS — R197 Diarrhea, unspecified: Secondary | ICD-10-CM | POA: Diagnosis not present

## 2020-11-26 DIAGNOSIS — D631 Anemia in chronic kidney disease: Secondary | ICD-10-CM | POA: Diagnosis not present

## 2020-11-26 DIAGNOSIS — D509 Iron deficiency anemia, unspecified: Secondary | ICD-10-CM | POA: Diagnosis not present

## 2020-11-26 DIAGNOSIS — N186 End stage renal disease: Secondary | ICD-10-CM | POA: Diagnosis not present

## 2020-11-28 ENCOUNTER — Other Ambulatory Visit (HOSPITAL_COMMUNITY): Payer: Self-pay

## 2020-11-28 DIAGNOSIS — Z992 Dependence on renal dialysis: Secondary | ICD-10-CM | POA: Diagnosis not present

## 2020-11-28 DIAGNOSIS — D689 Coagulation defect, unspecified: Secondary | ICD-10-CM | POA: Diagnosis not present

## 2020-11-28 DIAGNOSIS — N186 End stage renal disease: Secondary | ICD-10-CM | POA: Diagnosis not present

## 2020-11-28 DIAGNOSIS — D509 Iron deficiency anemia, unspecified: Secondary | ICD-10-CM | POA: Diagnosis not present

## 2020-11-28 DIAGNOSIS — R197 Diarrhea, unspecified: Secondary | ICD-10-CM | POA: Diagnosis not present

## 2020-11-28 DIAGNOSIS — N2581 Secondary hyperparathyroidism of renal origin: Secondary | ICD-10-CM | POA: Diagnosis not present

## 2020-11-28 DIAGNOSIS — D631 Anemia in chronic kidney disease: Secondary | ICD-10-CM | POA: Diagnosis not present

## 2020-11-30 DIAGNOSIS — Z992 Dependence on renal dialysis: Secondary | ICD-10-CM | POA: Diagnosis not present

## 2020-11-30 DIAGNOSIS — D689 Coagulation defect, unspecified: Secondary | ICD-10-CM | POA: Diagnosis not present

## 2020-11-30 DIAGNOSIS — R197 Diarrhea, unspecified: Secondary | ICD-10-CM | POA: Diagnosis not present

## 2020-11-30 DIAGNOSIS — N186 End stage renal disease: Secondary | ICD-10-CM | POA: Diagnosis not present

## 2020-11-30 DIAGNOSIS — D509 Iron deficiency anemia, unspecified: Secondary | ICD-10-CM | POA: Diagnosis not present

## 2020-11-30 DIAGNOSIS — N2581 Secondary hyperparathyroidism of renal origin: Secondary | ICD-10-CM | POA: Diagnosis not present

## 2020-11-30 DIAGNOSIS — D631 Anemia in chronic kidney disease: Secondary | ICD-10-CM | POA: Diagnosis not present

## 2020-12-02 ENCOUNTER — Other Ambulatory Visit: Payer: Self-pay

## 2020-12-02 ENCOUNTER — Ambulatory Visit (INDEPENDENT_AMBULATORY_CARE_PROVIDER_SITE_OTHER): Payer: Medicare Other | Admitting: Podiatry

## 2020-12-02 DIAGNOSIS — Z992 Dependence on renal dialysis: Secondary | ICD-10-CM

## 2020-12-02 DIAGNOSIS — N186 End stage renal disease: Secondary | ICD-10-CM | POA: Diagnosis not present

## 2020-12-02 DIAGNOSIS — I739 Peripheral vascular disease, unspecified: Secondary | ICD-10-CM

## 2020-12-02 DIAGNOSIS — L97518 Non-pressure chronic ulcer of other part of right foot with other specified severity: Secondary | ICD-10-CM | POA: Diagnosis not present

## 2020-12-02 DIAGNOSIS — E11621 Type 2 diabetes mellitus with foot ulcer: Secondary | ICD-10-CM

## 2020-12-02 DIAGNOSIS — E0822 Diabetes mellitus due to underlying condition with diabetic chronic kidney disease: Secondary | ICD-10-CM

## 2020-12-02 DIAGNOSIS — Z794 Long term (current) use of insulin: Secondary | ICD-10-CM

## 2020-12-02 DIAGNOSIS — Z89612 Acquired absence of left leg above knee: Secondary | ICD-10-CM | POA: Diagnosis not present

## 2020-12-02 MED ORDER — MUPIROCIN 2 % EX OINT: 1.0000 "application " | TOPICAL_OINTMENT | Freq: Two times a day (BID) | CUTANEOUS | 1 refills | Status: DC

## 2020-12-02 NOTE — Progress Notes (Signed)
HPI: 45 y.o. female presenting today for evaluation of superficial ulcers and skin peeling to the right foot.  Patient h/o AKA LLE and PAD as well as multiple other comorbidities.  She states that she has an appointment with vascular on 12/06/2020.  She denies history of injury to her right foot.  She has been applying Vaseline for moisturization.  Past Medical History:  Diagnosis Date  . Anal abscess    chronic  . Anxiety   . CKD (chronic kidney disease) stage 5, GFR less than 15 ml/min (HCC) 06/19/2016   . Suspect etiologies to include diabetes and hypertension though cannot exclude HIV-associated nephropathy. Advised her against from using excessive Goody powder.  . Constipation    trouble stoll coming out  . Depression 06/28/2006   Qualifier: Diagnosis of  By: Riccardo Dubin MD, Sherren Mocha    . Diabetes mellitus type 2 in obese ([place]) 06/28/1994  . DKA (diabetic ketoacidoses) 12/2014  . Dyspnea    uses oxygeb 2 liters per minute at dialysis  . End stage renal disease on dialysis ([place])   . Erosive esophagitis   . Esophageal reflux   . ESRD on hemodialysis ([place]) 05/02/2019  . Eye redness   . Gastroparesis    ? diabetic  . GERD (gastroesophageal reflux disease)   . History of blood transfusion 2019  . History of hypertension    off bp meds 2020 due to low bp with dialysis  . Human immunodeficiency virus (HIV) disease ([place]) 04/23/2016  . Metabolic bone disease [id number]  . Moderate nonproliferative diabetic retinopathy of both eyes ([place]) 11/21/2014   11/14/14: Noted on retinal imaging; needs follow-up imaging in 6 months  05/22/16: Noted on retinal imaging again; needs follow-up imaging in 6 months  . Normocytic anemia   . Pruritus, unspecified 05/20/2019  . Renal failure (ARF), acute on chronic ([place]) 11/24/2018   tues, thurs sat [address] hs since sept 2020  . Shortness of breath 07/01/2019   when lays doan at night, musy prop head up on 2 regular and 2 small pillows  . Type 2 diabetes  mellitus with diabetic peripheral angiopathy without gangrene ([place]) 05/01/2019  . Type II diabetes mellitus ([place]) 1994   diagnosed around 66  . Wears dentures    lower  . Wound dehiscence   . Wound infection s/p L transmetatarsal amputation      Physical Exam: General: The patient is alert and oriented x3 in no acute distress.  Dermatology: Skin is cool, dry right lower extremity.   Superficial ulcers with a light overlying eschar noted to the dorsal aspect of the fourth and fifth digits of the right foot.  No purulence.  No erythema or edema noted.  Vascular: Diminished pedal pulses RLE. No edema or erythema noted.  H/0 PAD  Neurological: Epicritic and protective threshold diminished bilaterally.   Musculoskeletal Exam: H/0 AKA LLE   Assessment: 1.  Diabetes mellitus with peripheral polyneuropathy on chronic dialysis 2.  CKD stage V 3.  PAD 4.  H/0 AKA LLE 5.  Superficial ulcers fourth and fifth toes right   Plan of Care:  1. Patient evaluated. 2.  Patient has appointment with vascular on 12/06/2020.  I believe the patient's symptoms may be vascular etiology 3.  Prescription for mupirocin 2% ointment to apply to the superficial wound 4.  Return to clinic in 3 months      Edrick Kins, DPM [facility]  Dr. Edrick Kins, DPM  [address], [place] [address]                Office [phone]  Fax [phone]

## 2020-12-02 NOTE — Patient Instructions (Signed)
"  Urea 40% lotion for feet" on [employer]

## 2020-12-03 DIAGNOSIS — N2581 Secondary hyperparathyroidism of renal origin: Secondary | ICD-10-CM | POA: Diagnosis not present

## 2020-12-03 DIAGNOSIS — D689 Coagulation defect, unspecified: Secondary | ICD-10-CM | POA: Diagnosis not present

## 2020-12-03 DIAGNOSIS — Z992 Dependence on renal dialysis: Secondary | ICD-10-CM | POA: Diagnosis not present

## 2020-12-03 DIAGNOSIS — D509 Iron deficiency anemia, unspecified: Secondary | ICD-10-CM | POA: Diagnosis not present

## 2020-12-03 DIAGNOSIS — N186 End stage renal disease: Secondary | ICD-10-CM | POA: Diagnosis not present

## 2020-12-04 ENCOUNTER — Ambulatory Visit: Payer: Medicare Other | Admitting: Podiatry

## 2020-12-04 ENCOUNTER — Other Ambulatory Visit (HOSPITAL_COMMUNITY): Payer: Self-pay

## 2020-12-05 DIAGNOSIS — D689 Coagulation defect, unspecified: Secondary | ICD-10-CM | POA: Diagnosis not present

## 2020-12-05 DIAGNOSIS — Z992 Dependence on renal dialysis: Secondary | ICD-10-CM | POA: Diagnosis not present

## 2020-12-05 DIAGNOSIS — D509 Iron deficiency anemia, unspecified: Secondary | ICD-10-CM | POA: Diagnosis not present

## 2020-12-05 DIAGNOSIS — N186 End stage renal disease: Secondary | ICD-10-CM | POA: Diagnosis not present

## 2020-12-05 DIAGNOSIS — N2581 Secondary hyperparathyroidism of renal origin: Secondary | ICD-10-CM | POA: Diagnosis not present

## 2020-12-06 ENCOUNTER — Other Ambulatory Visit: Payer: Self-pay

## 2020-12-06 ENCOUNTER — Telehealth: Payer: Self-pay

## 2020-12-06 ENCOUNTER — Ambulatory Visit: Payer: Medicare Other

## 2020-12-06 ENCOUNTER — Ambulatory Visit (HOSPITAL_COMMUNITY): Payer: Medicare Other

## 2020-12-06 ENCOUNTER — Ambulatory Visit (HOSPITAL_COMMUNITY)
Admission: RE | Admit: 2020-12-06 | Discharge: 2020-12-06 | Disposition: A | Discharge status: Home / Self Care | Payer: Medicare Other | Source: Ambulatory Visit | Attending: Vascular Surgery | Admitting: Vascular Surgery

## 2020-12-06 ENCOUNTER — Ambulatory Visit (INDEPENDENT_AMBULATORY_CARE_PROVIDER_SITE_OTHER): Payer: Medicare Other | Admitting: Physician Assistant

## 2020-12-06 VITALS — BP 194/87 | HR 86 | Temp 98.0°F | Resp 20 | Ht 65.0 in | Wt 167.5 lb

## 2020-12-06 DIAGNOSIS — I70221 Atherosclerosis of native arteries of extremities with rest pain, right leg: Secondary | ICD-10-CM | POA: Diagnosis not present

## 2020-12-06 DIAGNOSIS — I739 Peripheral vascular disease, unspecified: Secondary | ICD-10-CM

## 2020-12-06 DIAGNOSIS — Z992 Dependence on renal dialysis: Secondary | ICD-10-CM

## 2020-12-06 DIAGNOSIS — E1169 Type 2 diabetes mellitus with other specified complication: Secondary | ICD-10-CM

## 2020-12-06 DIAGNOSIS — N186 End stage renal disease: Secondary | ICD-10-CM

## 2020-12-06 NOTE — Telephone Encounter (Signed)
Call to pharmacy-they have refills on file and will get ready for patient.  Pt aware .Kathy Hidden Cassady4/29/20229:55 AM

## 2020-12-06 NOTE — H&P (View-Only) (Signed)
POST OPERATIVE DIALYSIS ACCESS OFFICE NOTE    CC:  F/u for dialysis access surgery  HPI:  This is a 45 y.o. female who is s/p left basilic AVF by Dr. Donnetta Hutching in 2020.  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  She states she has had episodes of prolonged bleeding from the proximal area of dilatation.  This does not happen with each dialysis treatment.  She denies extremity pain or hand pain.  Her main complaint today is right foot pain.  She has developed ulcerations of the dorsum aspect of her right fourth and fifth toes over the last several weeks.  Status post left above-the-knee amputation secondary to nonhealing left foot wounds.  She has a left prosthesis but she does not use it.  On July 05, 2018 she underwent abdominal aortogram with left lower extremity runoff by Dr. Trula Slade.  Presented at that time with purulent drainage from her left toe.  Her ultrasound studies revealed diminished toe pressures.  She underwent left superficial femoral popliteal artery angioplasty.  She is diabetic.  Dialysis days:    Tuesdays, Thursdays and Saturdays Dialysis center: [facility], [place]  No Known Allergies  Current Outpatient Medications  Medication Sig Dispense Refill  . Accu-Chek FastClix Lancets MISC Check blood sugar up to 3 times a day 306 each 3  . albuterol (VENTOLIN HFA) 108 (90 Base) MCG/ACT inhaler Inhale 2 puffs into the lungs every 4 (four) hours as needed for wheezing or shortness of breath.    . B Complex-C-Folic Acid (RENA-VITE RX) 1 MG TABS Take 1 tablet by mouth daily.    . bictegravir-emtricitabine-tenofovir AF (BIKTARVY) 50-200-25 MG TABS tablet TAKE 1 TABLET BY MOUTH DAILY. (Patient taking differently: Take 1 tablet by mouth every evening. Take one tab by mouth daily.) 30 tablet 3  . calcium acetate (PHOSLO) 667 MG capsule Take 2 capsules (1,334 mg total) by mouth 3 (three) times daily with meals. 180 capsule 2  .  diphenhydrAMINE (BENADRYL) 50 MG tablet Take 0.5 tablets (25 mg total) by mouth every 6 (six) hours as needed for itching (As needed for itching). 30 tablet 0  . DIPHENHYDRAMINE-ACETAMINOPHEN PO Take by mouth.    Marland Kitchen glucose blood (ACCU-CHEK SMARTVIEW) test strip Check blood sugar 4 times a day as instructed 125 each 12  . HEPARIN SODIUM, PORCINE, IV With hemodialysis    . insulin glargine (LANTUS) 100 UNIT/ML Solostar Pen Inject 5 Units into the skin at bedtime. (Patient taking differently: Inject 17 Units into the skin at bedtime.) 3 mL 0  . iron sucrose in sodium chloride 0.9 % 100 mL Q week with dialysis    . loperamide (IMODIUM A-D) 2 MG tablet Take 2 mg by mouth 4 (four) times daily as needed for diarrhea or loose stools.    . Methoxy PEG-Epoetin Beta (MIRCERA IJ) Mircera    . mupirocin ointment (BACTROBAN) 2 % Apply 1 application topically 2 (two) times daily. 30 g 1  . ondansetron (ZOFRAN ODT) 4 MG disintegrating tablet Take 1 tablet (4 mg total) by mouth every 8 (eight) hours as needed for nausea or vomiting. 20 tablet 0  . oxyCODONE (OXY IR/ROXICODONE) 5 MG immediate release tablet Take 1 tablet (5 mg total) by mouth every 6 (six) hours as needed for severe pain. 10 tablet 0  . promethazine (PHENERGAN) 25 MG tablet Take 25 mg by mouth 3 (three) times daily as needed.    . sertraline (ZOLOFT) 50 MG tablet Take 1 tablet (  50 mg total) by mouth daily. (Patient taking differently: Take 50 mg by mouth at bedtime.) 90 tablet 3  . TUMS 500 MG chewable tablet Chew 2 tablets by mouth at bedtime.    . Vitamin D, Ergocalciferol, 50000 units CAPS Take by mouth.     No current facility-administered medications for this visit.     ROS:  See HPI  BP (!) 194/87 (BP Location: Right Arm, Patient Position: Sitting, Cuff Size: Normal)   Pulse 86   Temp 98 F (36.7 C) (Temporal)   Resp 20   Ht '5\' 5"'$  (1.651 m)   Wt 167 lb 8.8 oz (76 kg)   SpO2 98%   BMI 27.88 kg/m    Physical Exam:  General  appearance: Somewhat chronically ill-appearing female who is tearful. Cardiac: Regular rhythm, mildly increased rate Respiratory: Nonlabored Extremities: Examination of her left upper extremity reveals mature arteriovenous fistula with aneurysmal dilatation.  There are 2 areas of hypopigmentation due to repeated cannulation sites.  There is no eschar or bleeding.  Her left hand was warm with intact sensation and motor function.  2+ radial pulse.     Dialysis duplex on 12/06/2020 Patent arteriovenous fistula-Aneurysmal dilatations noted.   Right lower extremity: Patient has ulceration of the dorsum of the right toe with partial tissue loss and eschar.  The right fourth toe also has an area of ulceration without exposed dermis.  There is also breakdown and mild ulceration of the cuticle of her right great toe she has monophasic dorsalis pedis, peroneal and posterior tibial Doppler signals.        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  Skin is intact.  There is no eschar.  I explained to her the best approach would be to rotate cannulation sites and avoid these repeated stick areas.  She will need to monitor skin overlying these areas.  #2: History of peripheral arterial disease and diabetes mellitus.  She is status post left above-knee amputation secondary to nonhealing left foot wound.  Today, she presents with right fourth and fifth toe ulceration and rest pain.  Her last ABIs were performed in 2019 and she had a right ABI of 0.88, with a biphasic posterior tibial waveform, monophasic dorsalis pedis waveform and a toe pressure of 73.  Due to tissue loss and rest pain, we will make arrangements for aortogram with right lower extremity runoff and possible intervention.  The patient is in agreement with this plan.  We will schedule this on a nondialysis day.  Barbie Banner, PA-C 12/06/2020 3:27 PM [facility] [phone]  Clinic MD: Dr. Trula Slade on call

## 2020-12-06 NOTE — Progress Notes (Signed)
POST OPERATIVE DIALYSIS ACCESS OFFICE NOTE    CC:  F/u for dialysis access surgery  HPI:  This is a 45 y.o. female who is s/p left basilic AVF by Dr. Donnetta Hutching in 2020.  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  She states she has had episodes of prolonged bleeding from the proximal area of dilatation.  This does not happen with each dialysis treatment.  She denies extremity pain or hand pain.  Her main complaint today is right foot pain.  She has developed ulcerations of the dorsum aspect of her right fourth and fifth toes over the last several weeks.  Status post left above-the-knee amputation secondary to nonhealing left foot wounds.  She has a left prosthesis but she does not use it.  On July 05, 2018 she underwent abdominal aortogram with left lower extremity runoff by Dr. Trula Slade.  Presented at that time with purulent drainage from her left toe.  Her ultrasound studies revealed diminished toe pressures.  She underwent left superficial femoral popliteal artery angioplasty.  She is diabetic.  Dialysis days:    Tuesdays, Thursdays and Saturdays Dialysis center: [facility], [place]  No Known Allergies  Current Outpatient Medications  Medication Sig Dispense Refill  . Accu-Chek FastClix Lancets MISC Check blood sugar up to 3 times a day 306 each 3  . albuterol (VENTOLIN HFA) 108 (90 Base) MCG/ACT inhaler Inhale 2 puffs into the lungs every 4 (four) hours as needed for wheezing or shortness of breath.    . B Complex-C-Folic Acid (RENA-VITE RX) 1 MG TABS Take 1 tablet by mouth daily.    . bictegravir-emtricitabine-tenofovir AF (BIKTARVY) 50-200-25 MG TABS tablet TAKE 1 TABLET BY MOUTH DAILY. (Patient taking differently: Take 1 tablet by mouth every evening. Take one tab by mouth daily.) 30 tablet 3  . calcium acetate (PHOSLO) 667 MG capsule Take 2 capsules (1,334 mg total) by mouth 3 (three) times daily with meals. 180 capsule 2  .  diphenhydrAMINE (BENADRYL) 50 MG tablet Take 0.5 tablets (25 mg total) by mouth every 6 (six) hours as needed for itching (As needed for itching). 30 tablet 0  . DIPHENHYDRAMINE-ACETAMINOPHEN PO Take by mouth.    Marland Kitchen glucose blood (ACCU-CHEK SMARTVIEW) test strip Check blood sugar 4 times a day as instructed 125 each 12  . HEPARIN SODIUM, PORCINE, IV With hemodialysis    . insulin glargine (LANTUS) 100 UNIT/ML Solostar Pen Inject 5 Units into the skin at bedtime. (Patient taking differently: Inject 17 Units into the skin at bedtime.) 3 mL 0  . iron sucrose in sodium chloride 0.9 % 100 mL Q week with dialysis    . loperamide (IMODIUM A-D) 2 MG tablet Take 2 mg by mouth 4 (four) times daily as needed for diarrhea or loose stools.    . Methoxy PEG-Epoetin Beta (MIRCERA IJ) Mircera    . mupirocin ointment (BACTROBAN) 2 % Apply 1 application topically 2 (two) times daily. 30 g 1  . ondansetron (ZOFRAN ODT) 4 MG disintegrating tablet Take 1 tablet (4 mg total) by mouth every 8 (eight) hours as needed for nausea or vomiting. 20 tablet 0  . oxyCODONE (OXY IR/ROXICODONE) 5 MG immediate release tablet Take 1 tablet (5 mg total) by mouth every 6 (six) hours as needed for severe pain. 10 tablet 0  . promethazine (PHENERGAN) 25 MG tablet Take 25 mg by mouth 3 (three) times daily as needed.    . sertraline (ZOLOFT) 50 MG tablet Take 1 tablet (  50 mg total) by mouth daily. (Patient taking differently: Take 50 mg by mouth at bedtime.) 90 tablet 3  . TUMS 500 MG chewable tablet Chew 2 tablets by mouth at bedtime.    . Vitamin D, Ergocalciferol, 50000 units CAPS Take by mouth.     No current facility-administered medications for this visit.     ROS:  See HPI  BP (!) 194/87 (BP Location: Right Arm, Patient Position: Sitting, Cuff Size: Normal)   Pulse 86   Temp 98 F (36.7 C) (Temporal)   Resp 20   Ht '5\' 5"'$  (1.651 m)   Wt 167 lb 8.8 oz (76 kg)   SpO2 98%   BMI 27.88 kg/m    Physical Exam:  General  appearance: Somewhat chronically ill-appearing female who is tearful. Cardiac: Regular rhythm, mildly increased rate Respiratory: Nonlabored Extremities: Examination of her left upper extremity reveals mature arteriovenous fistula with aneurysmal dilatation.  There are 2 areas of hypopigmentation due to repeated cannulation sites.  There is no eschar or bleeding.  Her left hand was warm with intact sensation and motor function.  2+ radial pulse.     Dialysis duplex on 12/06/2020 Patent arteriovenous fistula-Aneurysmal dilatations noted.   Right lower extremity: Patient has ulceration of the dorsum of the right toe with partial tissue loss and eschar.  The right fourth toe also has an area of ulceration without exposed dermis.  There is also breakdown and mild ulceration of the cuticle of her right great toe she has monophasic dorsalis pedis, peroneal and posterior tibial Doppler signals.        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  Skin is intact.  There is no eschar.  I explained to her the best approach would be to rotate cannulation sites and avoid these repeated stick areas.  She will need to monitor skin overlying these areas.  #2: History of peripheral arterial disease and diabetes mellitus.  She is status post left above-knee amputation secondary to nonhealing left foot wound.  Today, she presents with right fourth and fifth toe ulceration and rest pain.  Her last ABIs were performed in 2019 and she had a right ABI of 0.88, with a biphasic posterior tibial waveform, monophasic dorsalis pedis waveform and a toe pressure of 73.  Due to tissue loss and rest pain, we will make arrangements for aortogram with right lower extremity runoff and possible intervention.  The patient is in agreement with this plan.  We will schedule this on a nondialysis day.  Kathy Banner, PA-C 12/06/2020 3:27 PM [facility] [phone]  Clinic MD: Dr. Trula Slade on call

## 2020-12-06 NOTE — Telephone Encounter (Signed)
Need refill on  glucose blood (ACCU-CHEK SMARTVIEW) test strip ;pt contact [facility] ([place]), [place] - [address]

## 2020-12-07 DIAGNOSIS — N186 End stage renal disease: Secondary | ICD-10-CM | POA: Diagnosis not present

## 2020-12-07 DIAGNOSIS — N2581 Secondary hyperparathyroidism of renal origin: Secondary | ICD-10-CM | POA: Diagnosis not present

## 2020-12-07 DIAGNOSIS — Z992 Dependence on renal dialysis: Secondary | ICD-10-CM | POA: Diagnosis not present

## 2020-12-07 DIAGNOSIS — D509 Iron deficiency anemia, unspecified: Secondary | ICD-10-CM | POA: Diagnosis not present

## 2020-12-07 DIAGNOSIS — E1129 Type 2 diabetes mellitus with other diabetic kidney complication: Secondary | ICD-10-CM | POA: Diagnosis not present

## 2020-12-07 DIAGNOSIS — D689 Coagulation defect, unspecified: Secondary | ICD-10-CM | POA: Diagnosis not present

## 2020-12-10 ENCOUNTER — Telehealth: Payer: Self-pay

## 2020-12-10 ENCOUNTER — Other Ambulatory Visit: Payer: Self-pay | Admitting: Physician Assistant

## 2020-12-10 DIAGNOSIS — N2581 Secondary hyperparathyroidism of renal origin: Secondary | ICD-10-CM | POA: Diagnosis not present

## 2020-12-10 DIAGNOSIS — D689 Coagulation defect, unspecified: Secondary | ICD-10-CM | POA: Diagnosis not present

## 2020-12-10 DIAGNOSIS — Z992 Dependence on renal dialysis: Secondary | ICD-10-CM | POA: Diagnosis not present

## 2020-12-10 DIAGNOSIS — D631 Anemia in chronic kidney disease: Secondary | ICD-10-CM | POA: Diagnosis not present

## 2020-12-10 DIAGNOSIS — N186 End stage renal disease: Secondary | ICD-10-CM | POA: Diagnosis not present

## 2020-12-10 DIAGNOSIS — D509 Iron deficiency anemia, unspecified: Secondary | ICD-10-CM | POA: Diagnosis not present

## 2020-12-10 MED ORDER — OXYCODONE-ACETAMINOPHEN 10-325 MG PO TABS: 1.0000 | ORAL_TABLET | Freq: Four times a day (QID) | ORAL | 0 refills | Status: DC | PRN

## 2020-12-10 NOTE — Telephone Encounter (Signed)
Required to change rx to percocet 10/325 3x a day - per Pharmacist q6h was above the 50 morphine meq law.

## 2020-12-11 ENCOUNTER — Other Ambulatory Visit (HOSPITAL_COMMUNITY): Payer: Medicare Other

## 2020-12-12 ENCOUNTER — Other Ambulatory Visit (HOSPITAL_COMMUNITY)
Admission: RE | Admit: 2020-12-12 | Discharge: 2020-12-12 | Disposition: A | Discharge status: Home / Self Care | Payer: Medicare Other | Source: Ambulatory Visit | Attending: Vascular Surgery | Admitting: Vascular Surgery

## 2020-12-12 DIAGNOSIS — Z01812 Encounter for preprocedural laboratory examination: Secondary | ICD-10-CM | POA: Diagnosis not present

## 2020-12-12 DIAGNOSIS — D509 Iron deficiency anemia, unspecified: Secondary | ICD-10-CM | POA: Diagnosis not present

## 2020-12-12 DIAGNOSIS — Z20822 Contact with and (suspected) exposure to covid-19: Secondary | ICD-10-CM | POA: Insufficient documentation

## 2020-12-12 DIAGNOSIS — D631 Anemia in chronic kidney disease: Secondary | ICD-10-CM | POA: Diagnosis not present

## 2020-12-12 DIAGNOSIS — D689 Coagulation defect, unspecified: Secondary | ICD-10-CM | POA: Diagnosis not present

## 2020-12-12 DIAGNOSIS — N186 End stage renal disease: Secondary | ICD-10-CM | POA: Diagnosis not present

## 2020-12-12 DIAGNOSIS — Z992 Dependence on renal dialysis: Secondary | ICD-10-CM | POA: Diagnosis not present

## 2020-12-12 DIAGNOSIS — N2581 Secondary hyperparathyroidism of renal origin: Secondary | ICD-10-CM | POA: Diagnosis not present

## 2020-12-13 ENCOUNTER — Encounter (HOSPITAL_COMMUNITY)
Admission: RE | Disposition: A | Discharge status: Home / Self Care | Payer: Self-pay | Source: Home / Self Care | Attending: Vascular Surgery

## 2020-12-13 ENCOUNTER — Other Ambulatory Visit: Payer: Self-pay

## 2020-12-13 ENCOUNTER — Ambulatory Visit (HOSPITAL_COMMUNITY)
Admission: RE | Admit: 2020-12-13 | Discharge: 2020-12-13 | Disposition: A | Discharge status: Home / Self Care | Payer: Medicare Other | Attending: Vascular Surgery | Admitting: Vascular Surgery

## 2020-12-13 DIAGNOSIS — I70235 Atherosclerosis of native arteries of right leg with ulceration of other part of foot: Secondary | ICD-10-CM | POA: Diagnosis not present

## 2020-12-13 DIAGNOSIS — Z794 Long term (current) use of insulin: Secondary | ICD-10-CM | POA: Insufficient documentation

## 2020-12-13 DIAGNOSIS — L97519 Non-pressure chronic ulcer of other part of right foot with unspecified severity: Secondary | ICD-10-CM | POA: Diagnosis not present

## 2020-12-13 DIAGNOSIS — E1122 Type 2 diabetes mellitus with diabetic chronic kidney disease: Secondary | ICD-10-CM | POA: Diagnosis not present

## 2020-12-13 DIAGNOSIS — E11621 Type 2 diabetes mellitus with foot ulcer: Secondary | ICD-10-CM | POA: Diagnosis not present

## 2020-12-13 DIAGNOSIS — N186 End stage renal disease: Secondary | ICD-10-CM | POA: Diagnosis not present

## 2020-12-13 DIAGNOSIS — Z992 Dependence on renal dialysis: Secondary | ICD-10-CM | POA: Insufficient documentation

## 2020-12-13 DIAGNOSIS — E1151 Type 2 diabetes mellitus with diabetic peripheral angiopathy without gangrene: Secondary | ICD-10-CM | POA: Insufficient documentation

## 2020-12-13 DIAGNOSIS — Z79899 Other long term (current) drug therapy: Secondary | ICD-10-CM | POA: Insufficient documentation

## 2020-12-13 DIAGNOSIS — I998 Other disorder of circulatory system: Secondary | ICD-10-CM

## 2020-12-13 HISTORY — PX: ABDOMINAL AORTOGRAM W/LOWER EXTREMITY: CATH118223

## 2020-12-13 HISTORY — PX: PERIPHERAL VASCULAR BALLOON ANGIOPLASTY: CATH118281

## 2020-12-13 HISTORY — PX: PERIPHERAL VASCULAR ATHERECTOMY: CATH118256

## 2020-12-13 LAB — POCT I-STAT, CHEM 8
BUN: 35 mg/dL — ABNORMAL HIGH (ref 6–20)
Calcium, Ion: 1.1 mmol/L — ABNORMAL LOW (ref 1.15–1.40)
Chloride: 91 mmol/L — ABNORMAL LOW (ref 98–111)
Creatinine, Ser: 6.5 mg/dL — ABNORMAL HIGH (ref 0.44–1.00)
Glucose, Bld: 163 mg/dL — ABNORMAL HIGH (ref 70–99)
HCT: 32 % — ABNORMAL LOW (ref 36.0–46.0)
Hemoglobin: 10.9 g/dL — ABNORMAL LOW (ref 12.0–15.0)
Potassium: 4.4 mmol/L (ref 3.5–5.1)
Sodium: 131 mmol/L — ABNORMAL LOW (ref 135–145)
TCO2: 31 mmol/L (ref 22–32)

## 2020-12-13 LAB — GLUCOSE, CAPILLARY: Glucose-Capillary: 147 mg/dL — ABNORMAL HIGH (ref 70–99)

## 2020-12-13 LAB — POCT ACTIVATED CLOTTING TIME
Activated Clotting Time: 255 seconds
Activated Clotting Time: 249 seconds
Activated Clotting Time: 244 seconds
Activated Clotting Time: 172 seconds

## 2020-12-13 LAB — HCG, SERUM, QUALITATIVE: Preg, Serum: NEGATIVE

## 2020-12-13 LAB — SARS CORONAVIRUS 2 (TAT 6-24 HRS): SARS Coronavirus 2: NEGATIVE

## 2020-12-13 SURGERY — ABDOMINAL AORTOGRAM W/LOWER EXTREMITY
Anesthesia: LOCAL | Laterality: Right

## 2020-12-13 MED ORDER — HYDRALAZINE HCL 20 MG/ML IJ SOLN
5.0000 mg | INTRAMUSCULAR | Status: DC | PRN
Start: 2020-12-13 — End: 2020-12-14
  Administered 2020-12-13: 5 mg via INTRAVENOUS

## 2020-12-13 MED ORDER — MIDAZOLAM HCL 2 MG/2ML IJ SOLN
INTRAMUSCULAR | Status: AC
  Filled 2020-12-13: qty 2

## 2020-12-13 MED ORDER — MIDAZOLAM HCL 2 MG/2ML IJ SOLN
INTRAMUSCULAR | Status: DC | PRN
  Administered 2020-12-13: 1 mg via INTRAVENOUS

## 2020-12-13 MED ORDER — HYDRALAZINE HCL 20 MG/ML IJ SOLN
INTRAMUSCULAR | Status: AC
  Filled 2020-12-13: qty 1

## 2020-12-13 MED ORDER — SODIUM CHLORIDE 0.9% FLUSH: 3.0000 mL | Freq: Two times a day (BID) | INTRAVENOUS | Status: DC

## 2020-12-13 MED ORDER — HEPARIN SODIUM (PORCINE) 1000 UNIT/ML IJ SOLN
INTRAMUSCULAR | Status: AC
  Filled 2020-12-13: qty 1

## 2020-12-13 MED ORDER — SODIUM CHLORIDE 0.9% FLUSH: 3.0000 mL | INTRAVENOUS | Status: DC | PRN

## 2020-12-13 MED ORDER — HEPARIN (PORCINE) IN NACL 1000-0.9 UT/500ML-% IV SOLN
INTRAVENOUS | Status: AC
  Filled 2020-12-13: qty 1000

## 2020-12-13 MED ORDER — LABETALOL HCL 5 MG/ML IV SOLN
INTRAVENOUS | Status: AC
  Filled 2020-12-13: qty 4

## 2020-12-13 MED ORDER — IODIXANOL 320 MG/ML IV SOLN
INTRAVENOUS | Status: DC | PRN
  Administered 2020-12-13: 165 mL via INTRA_ARTERIAL

## 2020-12-13 MED ORDER — MORPHINE SULFATE (PF) 4 MG/ML IV SOLN
3.0000 mg | Freq: Once | INTRAVENOUS | Status: AC
  Administered 2020-12-13: 3 mg via INTRAVENOUS

## 2020-12-13 MED ORDER — LABETALOL HCL 5 MG/ML IV SOLN
10.0000 mg | INTRAVENOUS | Status: DC | PRN
Start: 2020-12-13 — End: 2020-12-14

## 2020-12-13 MED ORDER — ATORVASTATIN CALCIUM 10 MG PO TABS
10.0000 mg | ORAL_TABLET | Freq: Every day | ORAL | Status: DC
  Administered 2020-12-13: 10 mg via ORAL
  Filled 2020-12-13 (×2): qty 1

## 2020-12-13 MED ORDER — CLOPIDOGREL BISULFATE 75 MG PO TABS: 75.0000 mg | ORAL_TABLET | Freq: Every day | ORAL | Status: DC

## 2020-12-13 MED ORDER — LIDOCAINE HCL (PF) 1 % IJ SOLN
INTRAMUSCULAR | Status: DC | PRN
  Administered 2020-12-13: 15 mL via INTRADERMAL

## 2020-12-13 MED ORDER — ONDANSETRON HCL 4 MG/2ML IJ SOLN: 4.0000 mg | Freq: Four times a day (QID) | INTRAMUSCULAR | Status: DC | PRN

## 2020-12-13 MED ORDER — HEPARIN SODIUM (PORCINE) 1000 UNIT/ML IJ SOLN
INTRAMUSCULAR | Status: DC | PRN
  Administered 2020-12-13: 2000 [IU] via INTRAVENOUS
  Administered 2020-12-13: 1000 [IU] via INTRAVENOUS
  Administered 2020-12-13: 8000 [IU] via INTRAVENOUS

## 2020-12-13 MED ORDER — SODIUM CHLORIDE 0.9 % IV SOLN: 250.0000 mL | INTRAVENOUS | Status: DC | PRN

## 2020-12-13 MED ORDER — ASPIRIN 81 MG PO CHEW
CHEWABLE_TABLET | ORAL | Status: AC
  Administered 2020-12-13: 81 mg
  Filled 2020-12-13: qty 1

## 2020-12-13 MED ORDER — MORPHINE SULFATE (PF) 4 MG/ML IV SOLN
INTRAVENOUS | Status: AC
  Filled 2020-12-13: qty 1

## 2020-12-13 MED ORDER — LIDOCAINE HCL (PF) 1 % IJ SOLN
INTRAMUSCULAR | Status: AC
  Filled 2020-12-13: qty 30

## 2020-12-13 MED ORDER — LABETALOL HCL 5 MG/ML IV SOLN
INTRAVENOUS | Status: DC | PRN
  Administered 2020-12-13 (×2): 10 mg via INTRAVENOUS

## 2020-12-13 MED ORDER — ASPIRIN EC 81 MG PO TBEC: 81.0000 mg | DELAYED_RELEASE_TABLET | Freq: Every day | ORAL | Status: DC

## 2020-12-13 MED ORDER — ACETAMINOPHEN 325 MG PO TABS: 650.0000 mg | ORAL_TABLET | ORAL | Status: DC | PRN

## 2020-12-13 MED ORDER — FENTANYL CITRATE (PF) 100 MCG/2ML IJ SOLN
INTRAMUSCULAR | Status: DC | PRN
  Administered 2020-12-13: 50 ug via INTRAVENOUS
  Administered 2020-12-13 (×2): 25 ug via INTRAVENOUS

## 2020-12-13 MED ORDER — HEPARIN (PORCINE) IN NACL 1000-0.9 UT/500ML-% IV SOLN
INTRAVENOUS | Status: DC | PRN
  Administered 2020-12-13 (×2): 500 mL

## 2020-12-13 MED ORDER — FENTANYL CITRATE (PF) 100 MCG/2ML IJ SOLN
INTRAMUSCULAR | Status: AC
  Filled 2020-12-13: qty 2

## 2020-12-13 SURGICAL SUPPLY — 24 items
BALLN JADE .018 2.0 X 150 (BALLOONS) ×3
BALLN JADE .018 2.5 X 150 (BALLOONS) ×3
BALLN JADE .018 5.0 X 150 (BALLOONS) ×6
BALLOON JADE .018 2.0 X 150 (BALLOONS) IMPLANT
BALLOON JADE .018 2.5 X 150 (BALLOONS) IMPLANT
BALLOON JADE .018 5.0 X 150 (BALLOONS) IMPLANT
CATH ANGIO 5F PIGTAIL 65CM (CATHETERS) ×1 IMPLANT
CATH AURYON 5FR ATHEREC 1.5 (CATHETERS) ×1 IMPLANT
CATH CROSS OVER TEMPO 5F (CATHETERS) ×1 IMPLANT
CATH QUICKCROSS ANG SELECT (CATHETERS) ×1 IMPLANT
CATH STRAIGHT 5FR 65CM (CATHETERS) ×1 IMPLANT
DEVICE CONTINUOUS FLUSH (MISCELLANEOUS) ×1 IMPLANT
KIT MICROPUNCTURE NIT STIFF (SHEATH) ×1 IMPLANT
KIT PV (KITS) ×3 IMPLANT
SHEATH PINNACLE 5F 10CM (SHEATH) ×1 IMPLANT
SHEATH PINNACLE ST 6F 45CM (SHEATH) ×1 IMPLANT
SYR MEDRAD MARK 7 150ML (SYRINGE) ×3 IMPLANT
TRANSDUCER W/STOPCOCK (MISCELLANEOUS) ×3 IMPLANT
TRAY PV CATH (CUSTOM PROCEDURE TRAY) ×3 IMPLANT
WIRE APROACH 25G .014X300CM (WIRE) ×1 IMPLANT
WIRE G V18X300CM (WIRE) ×1 IMPLANT
WIRE HITORQ VERSACORE ST 145CM (WIRE) ×1 IMPLANT
WIRE ROSEN-J .035X180CM (WIRE) ×1 IMPLANT
WIRE SPARTACORE .014X300CM (WIRE) ×1 IMPLANT

## 2020-12-13 NOTE — Op Note (Signed)
PATIENT: Kathy Frank      MRN: [id number] DOB: [birth year]    DATE OF PROCEDURE: 12/13/2020  INDICATIONS:    Kathy Frank is a 45 y.o. female who presents with critical limb ischemia of the right lower extremity.  Of note she is undergone a previous left above-the-knee amputation.  She has end-stage renal disease.  She was seen by the physicians assistant in the office with wounds on the right foot and set up for an arteriogram.  PROCEDURE:    1.  Conscious sedation 2.  Ultrasound-guided access to the left common femoral artery 3.  Aortogram with bilateral iliac arteriogram 5.  Selective catheterization of the right external iliac artery with right lower extremity runoff (second order catheterization) 6.  Attempted angioplasty of the right peroneal artery 7.  Angioplasty of the right anterior tibial artery (2 mm and 2.5 mm X 150) jade balloon 8.  Laser atherectomy of the right superficial femoral artery and popliteal artery 9.  Angioplasty of the right superficial femoral artery and popliteal artery with 5 mm x 150 mm balloon   SURGEON: Judeth Cornfield. Scot Dock, MD, FACS  ANESTHESIA: Local with sedation  EBL: Minimal  TECHNIQUE: The patient was brought to the peripheral vascular lab and was sedated. The period of conscious sedation was 149 minutes.  During that time period, I was present face-to-face 100% of the time.  The patient was administered 1 mg of Versed and 50 mcg of fentanyl. The patient's heart rate, blood pressure, and oxygen saturation were monitored by the nurse continuously during the procedure.  Both groins were prepped and draped in the usual sterile fashion.  Under ultrasound guidance, after the skin was anesthetized, I cannulated the left common femoral artery with a micropuncture needle and a micropuncture sheath was introduced over a wire.  This was exchanged for a 5 [place] sheath over a Bentson wire.  By ultrasound the femoral artery was patent. A real-time image  was obtained and sent to the server.  A pigtail catheter was positioned at the L1 vertebral body and flush aortogram obtained.  I then positioned the catheter above the bifurcation and exchanged this for a crossover catheter which was positioned into the right common iliac artery.  I then advanced the versa core wire down into the external iliac artery just proximal to the CFA.   Right external iliac arteriogram was obtained.  This demonstrated multifocal disease superficial femoral artery and popliteal artery in addition to severe tibial artery occlusive disease.  Patient had critical limb ischemia options for bypass given her severe tibial disease with distal targets.  This reason I elected to proceed with atherectomy of the superficial femoral vein popliteal and tibial angioplasty.  A Rosen wire was advanced through the straight catheter and a 5 [place] sheath was exchanged for a 6 [place] destination sheath which was positioned into the distal right common femoral artery.  I then advanced a [place] core wire 0.014 using a quick cross selected for support and was able to cannulate the peroneal artery proximally.  However there was a short segment occlusion with a large collateral proximal to this and after attempting for about 30 minutes I was unable to get through the occlusion of the peroneal artery.  I then used the catheter and wire to cannulate the anterior tibial artery and was able to get this down into the proximal anterior tibial artery.  I then exchanged for a V 18 wire and was able to get this down  to the foot through the anterior tibial artery.  I then performed balloon angioplasty of the anterior tibial artery with a 2.5 x 150 jade balloon x2 and then distally a 2.0 x 150 J balloon.  It was difficult to pass the balloon and I did not think would be able to tolerate a larger balloon given the size of the vessel.  Next I addressed the SFA and popliteal disease.  The V 18 wire was exchanged for a  0.014 wire through the balloon.  Next laser arthrectomy was performed starting in the distal common femoral artery through the superficial femoral artery all the way down to the popliteal artery at the level of the knee joint initially using 50 micro joules per millimeter squared and then making a second pass with 60 micro joules per millimeter squared.  Follow-up film showed improvement with some residual stenosis in the superficial femoral artery and popliteal artery.  I then went back with a 5 mm x 150 jade balloon and addressed the popliteal disease starting at the level of the knee joint and extending all the way up to the proximal SFA.  Completion film showed significant improvement throughout.  FINDINGS:   1.  Single renal arteries bilaterally with no significant renal artery stenosis identified.  The infrarenal aorta, bilateral common iliac arteries, bilateral hypogastric arteries, and bilateral external iliac arteries are patent. 2.  The patient has a left above-the-knee amputation. 3.  On the right side, which is the side of concern, the patient had a patent common femoral and deep femoral artery.  There was plaque starting in the distal common femoral artery extending into the proximal superficial femoral artery.  There were then multiple stenoses throughout the superficial femoral artery and popliteal artery down to the knee.  The below-knee popliteal segment was patent however there was occlusion of the tibials at this level with reconstitution of a diseased anterior tibial, I diseased peroneal, and a diseased posterior tibial artery. 4.  Completion film after laser arthrectomy shows good result throughout the superficial femoral artery and popliteal artery with no significant stenosis.  The patient has severe tibial disease with disease tibial runoff with no good options for bypass.  CLINICAL NOTE: I will arrange follow-up in the office to follow the patient's foot wounds.  I do not see any  further options for revascularization.  I have started aspirin, Plavix, and a statin.  Deitra Mayo, MD, FACS [facility]  DATE OF DICTATION:   12/13/2020

## 2020-12-13 NOTE — Progress Notes (Signed)
Attempting to call Dr. Scot Dock regarding Lipitor and Aspirin order. One dose of Aspirin and Lipitor was ordered on the [facility] but a script wasn't called in to pharmacy. Text page was sent as well. Administering first dose of Aspirin and Lipitor per MD order.

## 2020-12-13 NOTE — Progress Notes (Signed)
Site area: Left groin a 6 french long sheath was removed  Site Prior to Removal:  Level 0  Pressure Applied For 20 MINUTES    Bedrest 1500pm X 4 hours Beginning at   Manual:   Yes.    Patient Status During Pull:  stable  Post Pull Groin Site:  Level 0  Post Pull Instructions Given:  Yes.    Post Pull Pulses Present:  Yes.    Dressing Applied:  Yes.    Comments:

## 2020-12-13 NOTE — Interval H&P Note (Signed)
History and Physical Interval Note:  12/13/2020 9:16 AM  Kathy Frank  has presented today for surgery, with the diagnosis of toe ulcers.  The various methods of treatment have been discussed with the patient and family. After consideration of risks, benefits and other options for treatment, the patient has consented to  Procedure(s): ABDOMINAL AORTOGRAM W/LOWER EXTREMITY (N/A) as a surgical intervention.  The patient's history has been reviewed, patient examined, no change in status, stable for surgery.  I have reviewed the patient's chart and labs.  Questions were answered to the patient's satisfaction.     Deitra Mayo

## 2020-12-13 NOTE — Discharge Instructions (Signed)
Femoral Site Care  This sheet gives you information about how to care for yourself after your procedure. Your health care provider may also give you more specific instructions. If you have problems or questions, contact your health care provider. What can I expect after the procedure? After the procedure, it is common to have:  Bruising that usually fades within 1-2 weeks.  Tenderness at the site. Follow these instructions at home: Wound care  Follow instructions from your health care provider about how to take care of your insertion site. Make sure you: ? Wash your hands with soap and water before you change your bandage (dressing). If soap and water are not available, use hand sanitizer. ? Change your dressing as told by your health care provider. ? Leave stitches (sutures), skin glue, or adhesive strips in place. These skin closures may need to stay in place for 2 weeks or longer. If adhesive strip edges start to loosen and curl up, you may trim the loose edges. Do not remove adhesive strips completely unless your health care provider tells you to do that.  Do not take baths, swim, or use a hot tub until your health care provider approves.  You may shower 24-48 hours after the procedure or as told by your health care provider. ? Gently wash the site with plain soap and water. ? Pat the area dry with a clean towel. ? Do not rub the site. This may cause bleeding.  Do not apply powder or lotion to the site. Keep the site clean and dry.  Check your femoral site every day for signs of infection. Check for: ? Redness, swelling, or pain. ? Fluid or blood. ? Warmth. ? Pus or a bad smell. Activity  For the first 2-3 days after your procedure, or as long as directed: ? Avoid climbing stairs as much as possible. ? Do not squat.  Do not lift anything that is heavier than 10 lb (4.5 kg), or the limit that you are told, until your health care provider says that it is safe.  Rest as  directed. ? Avoid sitting for a long time without moving. Get up to take short walks every 1-2 hours.  Do not drive for 24 hours if you were given a medicine to help you relax (sedative). General instructions  Take over-the-counter and prescription medicines only as told by your health care provider.  Keep all follow-up visits as told by your health care provider. This is important. Contact a health care provider if you have:  A fever or chills.  You have redness, swelling, or pain around your insertion site. Get help right away if:  The catheter insertion area swells very fast.  You pass out.  You suddenly start to sweat or your skin gets clammy.  The catheter insertion area is bleeding, and the bleeding does not stop when you hold steady pressure on the area.  The area near or just beyond the catheter insertion site becomes pale, cool, tingly, or numb. These symptoms may represent a serious problem that is an emergency. Do not wait to see if the symptoms will go away. Get medical help right away. Call your local emergency services (911 in the [place] Do not drive yourself to the hospital. Summary  After the procedure, it is common to have bruising that usually fades within 1-2 weeks.  Check your femoral site every day for signs of infection.  Do not lift anything that is heavier than 10 lb (4.5 kg), or   the limit that you are told, until your health care provider says that it is safe. This information is not intended to replace advice given to you by your health care provider. Make sure you discuss any questions you have with your health care provider. Document Revised: 03/29/2020 Document Reviewed: 03/29/2020 Elsevier Patient Education  2021 [employer]vier Inc.  

## 2020-12-16 ENCOUNTER — Encounter (HOSPITAL_COMMUNITY): Payer: Self-pay | Admitting: Vascular Surgery

## 2020-12-16 ENCOUNTER — Telehealth: Payer: Self-pay

## 2020-12-16 NOTE — Telephone Encounter (Signed)
Patient called to report pain in her foot wounds. Discussed with PA, advised she did not have revascularization options and we could not give her pain medicine. Patient verbalizes understanding. She will follow up with our office in a few weeks. Sent message to [facility] to ask for plavix/asa/statin RX.

## 2020-12-17 ENCOUNTER — Other Ambulatory Visit: Payer: Self-pay

## 2020-12-17 DIAGNOSIS — D509 Iron deficiency anemia, unspecified: Secondary | ICD-10-CM | POA: Diagnosis not present

## 2020-12-17 DIAGNOSIS — D689 Coagulation defect, unspecified: Secondary | ICD-10-CM | POA: Diagnosis not present

## 2020-12-17 DIAGNOSIS — N2581 Secondary hyperparathyroidism of renal origin: Secondary | ICD-10-CM | POA: Diagnosis not present

## 2020-12-17 DIAGNOSIS — L299 Pruritus, unspecified: Secondary | ICD-10-CM | POA: Diagnosis not present

## 2020-12-17 DIAGNOSIS — I739 Peripheral vascular disease, unspecified: Secondary | ICD-10-CM

## 2020-12-17 DIAGNOSIS — N186 End stage renal disease: Secondary | ICD-10-CM | POA: Diagnosis not present

## 2020-12-17 DIAGNOSIS — E1129 Type 2 diabetes mellitus with other diabetic kidney complication: Secondary | ICD-10-CM | POA: Diagnosis not present

## 2020-12-17 DIAGNOSIS — Z992 Dependence on renal dialysis: Secondary | ICD-10-CM | POA: Diagnosis not present

## 2020-12-17 MED ORDER — CLOPIDOGREL BISULFATE 75 MG PO TABS: 75.0000 mg | ORAL_TABLET | Freq: Every day | ORAL | 3 refills | Status: DC

## 2020-12-17 MED ORDER — ASPIRIN EC 81 MG PO TBEC: 81.0000 mg | DELAYED_RELEASE_TABLET | Freq: Every day | ORAL | 3 refills | Status: DC

## 2020-12-17 MED ORDER — ATORVASTATIN CALCIUM 40 MG PO TABS: 40.0000 mg | ORAL_TABLET | Freq: Every day | ORAL | 3 refills | Status: DC

## 2020-12-19 DIAGNOSIS — N186 End stage renal disease: Secondary | ICD-10-CM | POA: Diagnosis not present

## 2020-12-19 DIAGNOSIS — D689 Coagulation defect, unspecified: Secondary | ICD-10-CM | POA: Diagnosis not present

## 2020-12-19 DIAGNOSIS — Z992 Dependence on renal dialysis: Secondary | ICD-10-CM | POA: Diagnosis not present

## 2020-12-19 DIAGNOSIS — E1129 Type 2 diabetes mellitus with other diabetic kidney complication: Secondary | ICD-10-CM | POA: Diagnosis not present

## 2020-12-19 DIAGNOSIS — L299 Pruritus, unspecified: Secondary | ICD-10-CM | POA: Diagnosis not present

## 2020-12-19 DIAGNOSIS — D509 Iron deficiency anemia, unspecified: Secondary | ICD-10-CM | POA: Diagnosis not present

## 2020-12-19 DIAGNOSIS — N2581 Secondary hyperparathyroidism of renal origin: Secondary | ICD-10-CM | POA: Diagnosis not present

## 2020-12-21 DIAGNOSIS — Z992 Dependence on renal dialysis: Secondary | ICD-10-CM | POA: Diagnosis not present

## 2020-12-21 DIAGNOSIS — D509 Iron deficiency anemia, unspecified: Secondary | ICD-10-CM | POA: Diagnosis not present

## 2020-12-21 DIAGNOSIS — L299 Pruritus, unspecified: Secondary | ICD-10-CM | POA: Diagnosis not present

## 2020-12-21 DIAGNOSIS — D689 Coagulation defect, unspecified: Secondary | ICD-10-CM | POA: Diagnosis not present

## 2020-12-21 DIAGNOSIS — N186 End stage renal disease: Secondary | ICD-10-CM | POA: Diagnosis not present

## 2020-12-21 DIAGNOSIS — N2581 Secondary hyperparathyroidism of renal origin: Secondary | ICD-10-CM | POA: Diagnosis not present

## 2020-12-21 DIAGNOSIS — E1129 Type 2 diabetes mellitus with other diabetic kidney complication: Secondary | ICD-10-CM | POA: Diagnosis not present

## 2020-12-23 ENCOUNTER — Other Ambulatory Visit: Payer: Self-pay

## 2020-12-23 ENCOUNTER — Ambulatory Visit (INDEPENDENT_AMBULATORY_CARE_PROVIDER_SITE_OTHER): Payer: Medicare Other | Admitting: Podiatry

## 2020-12-23 DIAGNOSIS — Z89612 Acquired absence of left leg above knee: Secondary | ICD-10-CM

## 2020-12-23 DIAGNOSIS — Z794 Long term (current) use of insulin: Secondary | ICD-10-CM

## 2020-12-23 DIAGNOSIS — L97512 Non-pressure chronic ulcer of other part of right foot with fat layer exposed: Secondary | ICD-10-CM

## 2020-12-23 DIAGNOSIS — E0822 Diabetes mellitus due to underlying condition with diabetic chronic kidney disease: Secondary | ICD-10-CM

## 2020-12-23 DIAGNOSIS — I739 Peripheral vascular disease, unspecified: Secondary | ICD-10-CM

## 2020-12-23 DIAGNOSIS — Z992 Dependence on renal dialysis: Secondary | ICD-10-CM

## 2020-12-23 DIAGNOSIS — N186 End stage renal disease: Secondary | ICD-10-CM

## 2020-12-23 NOTE — Progress Notes (Signed)
Subjective:  45 y.o. female with PMHx of diabetes mellitus, PAD, AKA LLE presenting today for follow-up evaluation of ulcers that have developed to the right forefoot and toes.  She recently underwent revascularization procedure by [facility].  She presents for further treatment and evaluation   Past Medical History:  Diagnosis Date  . Anal abscess    chronic  . Anxiety   . CKD (chronic kidney disease) stage 5, GFR less than 15 ml/min (HCC) 06/19/2016   . Suspect etiologies to include diabetes and hypertension though cannot exclude HIV-associated nephropathy. Advised her against from using excessive Goody powder.  . Constipation    trouble stoll coming out  . Depression 06/28/2006   Qualifier: Diagnosis of  By: Riccardo Dubin MD, Sherren Mocha    . Diabetes mellitus type 2 in obese ([place]) 06/28/1994  . DKA (diabetic ketoacidoses) 12/2014  . Dyspnea    uses oxygeb 2 liters per minute at dialysis  . End stage renal disease on dialysis ([place])   . Erosive esophagitis   . Esophageal reflux   . ESRD on hemodialysis ([place]) 05/02/2019  . Eye redness   . Gastroparesis    ? diabetic  . GERD (gastroesophageal reflux disease)   . History of blood transfusion 2019  . History of hypertension    off bp meds 2020 due to low bp with dialysis  . Human immunodeficiency virus (HIV) disease ([place]) 04/23/2016  . Metabolic bone disease [id number]  . Moderate nonproliferative diabetic retinopathy of both eyes ([place]) 11/21/2014   11/14/14: Noted on retinal imaging; needs follow-up imaging in 6 months  05/22/16: Noted on retinal imaging again; needs follow-up imaging in 6 months  . Normocytic anemia   . Pruritus, unspecified 05/20/2019  . Renal failure (ARF), acute on chronic ([place]) 11/24/2018   tues, thurs sat [address] hs since sept 2020  . Shortness of breath 07/01/2019   when lays doan at night, musy prop head up on 2 regular and 2 small pillows  . Type 2 diabetes mellitus with diabetic peripheral angiopathy  without gangrene ([place]) 05/01/2019  . Type II diabetes mellitus ([place]) 1994   diagnosed around 69  . Wears dentures    lower  . Wound dehiscence   . Wound infection s/p L transmetatarsal amputation       Objective/Physical Exam General: The patient is alert and oriented x3 in no acute distress.  Dermatology:  Wound #1 noted to the fourth digit right foot measuring approximately 0.8 x 0.8 x 0.1 cm (LxWxD).  Wound #2 noted to the fifth digit right foot measuring approximately 1.5 x 1.0 x 0.1 cm.  There is a shallow superficial eschar overlying the wounds. There is a moderate amount of slough, fibrin, and necrotic tissue noted. There is no exposed bone muscle-tendon ligament or joint. There is no malodor. Periwound integrity is intact. Skin is cool, dry and supple bilateral lower extremities.  Vascular: S/P angioplasty RLE. Dr. Deitra Mayo, [facility].  DOS: 12/13/2020  Neurological: Epicritic and protective threshold diminished bilaterally.   Musculoskeletal Exam: History of AKA LLE Assessment: 1.  Ulcer right forefoot secondary to diabetes mellitus and PAD 2. diabetes mellitus w/ peripheral neuropathy 3. H/o AKA LLE 4.  CKD stage V   Plan of Care:  1. Patient was evaluated. 2. medically necessary excisional debridement including subcutaneous tissue was performed using a tissue nipper and a chisel blade. Excisional debridement of all the necrotic nonviable tissue down to healthier bleeding viable tissue was performed with  post-debridement measurements same as pre-. 3. the wound was cleansed and dry sterile dressing applied. 4.  Recommend Betadine and dry dressings daily  5.  Patient is to return to clinic in 3 weeks   Edrick Kins, DPM [facility]  Dr. Edrick Kins, DPM    [address], [place] [address]                Office [phone]  Fax [phone]

## 2020-12-24 ENCOUNTER — Telehealth: Payer: Self-pay | Admitting: *Deleted

## 2020-12-24 DIAGNOSIS — N186 End stage renal disease: Secondary | ICD-10-CM | POA: Diagnosis not present

## 2020-12-24 DIAGNOSIS — N2581 Secondary hyperparathyroidism of renal origin: Secondary | ICD-10-CM | POA: Diagnosis not present

## 2020-12-24 DIAGNOSIS — D631 Anemia in chronic kidney disease: Secondary | ICD-10-CM | POA: Diagnosis not present

## 2020-12-24 DIAGNOSIS — D689 Coagulation defect, unspecified: Secondary | ICD-10-CM | POA: Diagnosis not present

## 2020-12-24 DIAGNOSIS — Z992 Dependence on renal dialysis: Secondary | ICD-10-CM | POA: Diagnosis not present

## 2020-12-24 DIAGNOSIS — D509 Iron deficiency anemia, unspecified: Secondary | ICD-10-CM | POA: Diagnosis not present

## 2020-12-24 DIAGNOSIS — L299 Pruritus, unspecified: Secondary | ICD-10-CM | POA: Diagnosis not present

## 2020-12-24 NOTE — Telephone Encounter (Signed)
Patient is calling because of foot pain,pressure so bad she can barely walk. She would like something for pain, and a refill of an ointment (mupirocin)used. Please advise.

## 2020-12-26 ENCOUNTER — Other Ambulatory Visit (HOSPITAL_BASED_OUTPATIENT_CLINIC_OR_DEPARTMENT_OTHER): Payer: Self-pay

## 2020-12-26 DIAGNOSIS — L299 Pruritus, unspecified: Secondary | ICD-10-CM | POA: Diagnosis not present

## 2020-12-26 DIAGNOSIS — N2581 Secondary hyperparathyroidism of renal origin: Secondary | ICD-10-CM | POA: Diagnosis not present

## 2020-12-26 DIAGNOSIS — D509 Iron deficiency anemia, unspecified: Secondary | ICD-10-CM | POA: Diagnosis not present

## 2020-12-26 DIAGNOSIS — Z992 Dependence on renal dialysis: Secondary | ICD-10-CM | POA: Diagnosis not present

## 2020-12-26 DIAGNOSIS — N186 End stage renal disease: Secondary | ICD-10-CM | POA: Diagnosis not present

## 2020-12-26 DIAGNOSIS — D631 Anemia in chronic kidney disease: Secondary | ICD-10-CM | POA: Diagnosis not present

## 2020-12-26 DIAGNOSIS — D689 Coagulation defect, unspecified: Secondary | ICD-10-CM | POA: Diagnosis not present

## 2020-12-27 ENCOUNTER — Other Ambulatory Visit (HOSPITAL_COMMUNITY): Payer: Self-pay

## 2020-12-28 DIAGNOSIS — L299 Pruritus, unspecified: Secondary | ICD-10-CM | POA: Diagnosis not present

## 2020-12-28 DIAGNOSIS — N186 End stage renal disease: Secondary | ICD-10-CM | POA: Diagnosis not present

## 2020-12-28 DIAGNOSIS — N2581 Secondary hyperparathyroidism of renal origin: Secondary | ICD-10-CM | POA: Diagnosis not present

## 2020-12-28 DIAGNOSIS — D631 Anemia in chronic kidney disease: Secondary | ICD-10-CM | POA: Diagnosis not present

## 2020-12-28 DIAGNOSIS — Z992 Dependence on renal dialysis: Secondary | ICD-10-CM | POA: Diagnosis not present

## 2020-12-28 DIAGNOSIS — D689 Coagulation defect, unspecified: Secondary | ICD-10-CM | POA: Diagnosis not present

## 2020-12-28 DIAGNOSIS — D509 Iron deficiency anemia, unspecified: Secondary | ICD-10-CM | POA: Diagnosis not present

## 2020-12-30 ENCOUNTER — Other Ambulatory Visit (HOSPITAL_COMMUNITY): Payer: Self-pay

## 2020-12-31 DIAGNOSIS — N186 End stage renal disease: Secondary | ICD-10-CM | POA: Diagnosis not present

## 2020-12-31 DIAGNOSIS — R197 Diarrhea, unspecified: Secondary | ICD-10-CM | POA: Diagnosis not present

## 2020-12-31 DIAGNOSIS — N2581 Secondary hyperparathyroidism of renal origin: Secondary | ICD-10-CM | POA: Diagnosis not present

## 2020-12-31 DIAGNOSIS — L299 Pruritus, unspecified: Secondary | ICD-10-CM | POA: Diagnosis not present

## 2020-12-31 DIAGNOSIS — Z992 Dependence on renal dialysis: Secondary | ICD-10-CM | POA: Diagnosis not present

## 2020-12-31 DIAGNOSIS — D689 Coagulation defect, unspecified: Secondary | ICD-10-CM | POA: Diagnosis not present

## 2021-01-01 ENCOUNTER — Other Ambulatory Visit (HOSPITAL_COMMUNITY): Payer: Self-pay

## 2021-01-01 MED FILL — Bictegravir-Emtricitabine-Tenofovir AF Tab 50-200-25 MG: ORAL | 30 days supply | Qty: 30 | Fill #1 | Status: AC

## 2021-01-04 DIAGNOSIS — N186 End stage renal disease: Secondary | ICD-10-CM | POA: Diagnosis not present

## 2021-01-04 DIAGNOSIS — L299 Pruritus, unspecified: Secondary | ICD-10-CM | POA: Diagnosis not present

## 2021-01-04 DIAGNOSIS — R197 Diarrhea, unspecified: Secondary | ICD-10-CM | POA: Diagnosis not present

## 2021-01-04 DIAGNOSIS — N2581 Secondary hyperparathyroidism of renal origin: Secondary | ICD-10-CM | POA: Diagnosis not present

## 2021-01-04 DIAGNOSIS — D689 Coagulation defect, unspecified: Secondary | ICD-10-CM | POA: Diagnosis not present

## 2021-01-04 DIAGNOSIS — Z992 Dependence on renal dialysis: Secondary | ICD-10-CM | POA: Diagnosis not present

## 2021-01-07 DIAGNOSIS — N2581 Secondary hyperparathyroidism of renal origin: Secondary | ICD-10-CM | POA: Diagnosis not present

## 2021-01-07 DIAGNOSIS — Z992 Dependence on renal dialysis: Secondary | ICD-10-CM | POA: Diagnosis not present

## 2021-01-07 DIAGNOSIS — L299 Pruritus, unspecified: Secondary | ICD-10-CM | POA: Diagnosis not present

## 2021-01-07 DIAGNOSIS — N186 End stage renal disease: Secondary | ICD-10-CM | POA: Diagnosis not present

## 2021-01-07 DIAGNOSIS — D689 Coagulation defect, unspecified: Secondary | ICD-10-CM | POA: Diagnosis not present

## 2021-01-07 DIAGNOSIS — E1129 Type 2 diabetes mellitus with other diabetic kidney complication: Secondary | ICD-10-CM | POA: Diagnosis not present

## 2021-01-07 NOTE — Telephone Encounter (Signed)
Appt. Scheduled 01/13/21

## 2021-01-08 ENCOUNTER — Ambulatory Visit (INDEPENDENT_AMBULATORY_CARE_PROVIDER_SITE_OTHER): Payer: Medicare Other | Admitting: Physician Assistant

## 2021-01-08 ENCOUNTER — Ambulatory Visit (HOSPITAL_COMMUNITY)
Admission: RE | Admit: 2021-01-08 | Discharge: 2021-01-08 | Disposition: A | Discharge status: Home / Self Care | Payer: Medicare Other | Source: Ambulatory Visit | Attending: Vascular Surgery | Admitting: Vascular Surgery

## 2021-01-08 ENCOUNTER — Other Ambulatory Visit: Payer: Self-pay

## 2021-01-08 VITALS — BP 167/78 | HR 82 | Temp 98.1°F | Resp 20 | Ht 65.0 in

## 2021-01-08 DIAGNOSIS — I739 Peripheral vascular disease, unspecified: Secondary | ICD-10-CM

## 2021-01-08 NOTE — Progress Notes (Signed)
Office Note     CC:  follow up Requesting Provider:  [name], MD  HPI: Kathy Frank is a 45 y.o. (July 30, 1976) female who presents for follow-up after recent aortogram with right lower extremity arteriogram and intervention on 12/13/2020 by Dr. Scot Dock.  She states she has continued right foot pain particularly of the plantar surface.  She is now seeing podiatry on a regular basis.  She states she is having dysesthesia due to cool air on her foot at nighttime.  She is taking her aspirin, statin and Plavix as prescribed.  No fever or chills.  She reports no swelling or bleeding from left groin.  12/13/2000:  Attempted angioplasty of the right peroneal artery  Angioplasty of the right anterior tibial artery (2 mm and 2.5 mm X 150) jade balloon  Laser atherectomy of the right superficial femoral artery and popliteal artery  Angioplasty of the right superficial femoral artery and popliteal artery with 5 mm x 150 mm balloon  She is diabetic and has a history of hypertension. Does not use tobacco products. End-stage renal disease on chronic intermittent hemodialysis.   Past Medical History:  Diagnosis Date  . Anal abscess    chronic  . Anxiety   . CKD (chronic kidney disease) stage 5, GFR less than 15 ml/min (HCC) 06/19/2016   . Suspect etiologies to include diabetes and hypertension though cannot exclude HIV-associated nephropathy. Advised her against from using excessive Goody powder.  . Constipation    trouble stoll coming out  . Depression 06/28/2006   Qualifier: Diagnosis of  By: Riccardo Dubin MD, Sherren Mocha    . Diabetes mellitus type 2 in obese ([place]) 06/28/1994  . DKA (diabetic ketoacidoses) 12/2014  . Dyspnea    uses oxygeb 2 liters per minute at dialysis  . End stage renal disease on dialysis ([place])   . Erosive esophagitis   . Esophageal reflux   . ESRD on hemodialysis ([place]) 05/02/2019  . Eye redness   . Gastroparesis    ? diabetic  . GERD (gastroesophageal reflux disease)   .  History of blood transfusion 2019  . History of hypertension    off bp meds 2020 due to low bp with dialysis  . Human immunodeficiency virus (HIV) disease ([place]) 04/23/2016  . Metabolic bone disease 123XX123  . Moderate nonproliferative diabetic retinopathy of both eyes ([place]) 11/21/2014   11/14/14: Noted on retinal imaging; needs follow-up imaging in 6 months  05/22/16: Noted on retinal imaging again; needs follow-up imaging in 6 months  . Normocytic anemia   . Pruritus, unspecified 05/20/2019  . Renal failure (ARF), acute on chronic ([place]) 11/24/2018   tues, thurs sat [address] hs since sept 2020  . Shortness of breath 07/01/2019   when lays doan at night, musy prop head up on 2 regular and 2 small pillows  . Type 2 diabetes mellitus with diabetic peripheral angiopathy without gangrene ([place]) 05/01/2019  . Type II diabetes mellitus ([place]) 1994   diagnosed around 27  . Wears dentures    lower  . Wound dehiscence   . Wound infection s/p L transmetatarsal amputation     Past Surgical History:  Procedure Laterality Date  . ABDOMINAL AORTOGRAM W/LOWER EXTREMITY N/A 12/13/2020   Procedure: ABDOMINAL AORTOGRAM W/LOWER EXTREMITY;  Surgeon: Angelia Mould, MD;  Location: [facility];  Service: Cardiovascular;  Laterality: N/A;  . AMPUTATION Left 07/08/2018   Procedure: AMPUTATION FORTH RAY LEFT FOOT;  Surgeon: [name], MD;  Location: [facility];  Service: Orthopedics;  Laterality: Left;  . AMPUTATION Left 08/09/2018   Procedure: Left Transmetatarsal Amputation;  Surgeon: [name], MD;  Location: [facility];  Service: Orthopedics;  Laterality: Left;  . AMPUTATION Left 10/08/2018   Procedure: LEFT BELOW KNEE AMPUTATION;  Surgeon: [name], MD;  Location: [facility];  Service: Orthopedics;  Laterality: Left;  . AMPUTATION Left 10/28/2018   Procedure: REVISION BELOW KNEE AMPUTATION;  Surgeon: [name], MD;  Location: [facility];  Service: Orthopedics;  Laterality: Left;  .  AMPUTATION Left 12/16/2018   Procedure: LEFT ABOVE KNEE AMPUTATION;  Surgeon: [name], MD;  Location: [facility];  Service: Orthopedics;  Laterality: Left;  . AV FISTULA PLACEMENT Left 10/14/2018   Procedure: Arteriovenous (Av) Fistula Creation Left Arm;  Surgeon: [name], MD;  Location: [facility];  Service: Vascular;  Laterality: Left;  . BASCILIC VEIN TRANSPOSITION Left 04/14/2019   Procedure: BASILIC VEIN TRANSPOSITION SECOND STAGE LEFT ARM;  Surgeon: Rosetta Posner, MD;  Location: [facility];  Service: Vascular;  Laterality: Left;  . INCISION AND DRAINAGE ABSCESS N/A 10/24/2020   Procedure: exicision of hydradenitis;  Surgeon: Leighton Ruff, MD;  Location: [facility];  Service: General;  Laterality: N/A;  45 min  . INSERTION OF DIALYSIS CATHETER Right 04/14/2019   Procedure: INSERTION OF DIALYSIS CATHETER;  Surgeon: Rosetta Posner, MD;  Location: [facility];  Service: Vascular;  Laterality: Right;  . LOWER EXTREMITY ANGIOGRAPHY N/A 07/05/2018   Procedure: LOWER EXTREMITY ANGIOGRAPHY;  Surgeon: Serafina Mitchell, MD;  Location: [facility];  Service: Cardiovascular;  Laterality: N/A;  . PERIPHERAL VASCULAR ATHERECTOMY Right 12/13/2020   Procedure: PERIPHERAL VASCULAR ATHERECTOMY;  Surgeon: Angelia Mould, MD;  Location: [facility];  Service: Cardiovascular;  Laterality: Right;  Superficial femoral  . PERIPHERAL VASCULAR BALLOON ANGIOPLASTY Left 07/05/2018   Procedure: PERIPHERAL VASCULAR BALLOON ANGIOPLASTY;  Surgeon: Serafina Mitchell, MD;  Location: [facility];  Service: Cardiovascular;  Laterality: Left;  SFA  . PERIPHERAL VASCULAR BALLOON ANGIOPLASTY Right 12/13/2020   Procedure: PERIPHERAL VASCULAR BALLOON ANGIOPLASTY;  Surgeon: Angelia Mould, MD;  Location: [facility];  Service: Cardiovascular;  Laterality: Right;  Peroneal artery, anterior tibial artery  . RECTAL EXAM UNDER ANESTHESIA N/A 10/24/2020   Procedure: EXAM UNDER ANESTHESIA;   Surgeon: Leighton Ruff, MD;  Location: [facility];  Service: General;  Laterality: N/A;  . STUMP REVISION Left 10/19/2018   Procedure: REVISION LEFT BELOW KNEE AMPUTATION;  Surgeon: [name], MD;  Location: [facility];  Service: Orthopedics;  Laterality: Left;  . TUBAL LIGATION  2002    Social History   Socioeconomic History  . Marital status: Single    Spouse name: Not on file  . Number of children: Not on file  . Years of education: 77  . Highest education level: Not on file  Occupational History    Employer: UNEMPLOYED  Tobacco Use  . Smoking status: Former Smoker    Packs/day: 0.10    Years: 10.00    Pack years: 1.00    Types: Cigarettes    Start date: 11/17/2013    Quit date: 09/09/2014    Years since quitting: 6.3  . Smokeless tobacco: Never Used  Vaping Use  . Vaping Use: Never used  Substance and Sexual Activity  . Alcohol use: Not Currently  . Drug use: Not Currently    Types: Marijuana    Comment: marijuana last used 1  week 10-21-2020  . Sexual activity: Not on file  Other Topics Concern  . Not on file  Social History Narrative  . Not on file   Social Determinants of Health   Financial Resource Strain: Not on file  Food Insecurity: Not on file  Transportation Needs: Not on file  Physical Activity: Not on file  Stress: Not on file  Social Connections: Not on file  Intimate Partner Violence: Not on file   Family History  Problem Relation Age of Onset  . Diabetes Mother   . Diabetes Brother   . Diabetes Daughter   . Diabetes Daughter   . Mental retardation Brother        died from PNA  . Diabetes Maternal Grandmother     Current Outpatient Medications  Medication Sig Dispense Refill  . Accu-Chek FastClix Lancets MISC Check blood sugar up to 3 times a day 306 each 3  . albuterol (VENTOLIN HFA) 108 (90 Base) MCG/ACT inhaler Inhale 2 puffs into the lungs every 4 (four) hours as needed for wheezing or shortness of breath.    Marland Kitchen aspirin  EC 81 MG tablet Take 1 tablet (81 mg total) by mouth daily. Swallow whole. 90 tablet 3  . atorvastatin (LIPITOR) 40 MG tablet Take 1 tablet (40 mg total) by mouth daily. 90 tablet 3  . B Complex-C-Folic Acid (RENA-VITE RX) 1 MG TABS Take 1 tablet by mouth daily.    . bictegravir-emtricitabine-tenofovir AF (BIKTARVY) 50-200-25 MG TABS tablet TAKE 1 TABLET BY MOUTH DAILY. (Patient taking differently: Take 1 tablet by mouth every evening. Take one tab by mouth daily.) 30 tablet 3  . calcium acetate (PHOSLO) 667 MG capsule Take 2 capsules (1,334 mg total) by mouth 3 (three) times daily with meals. 180 capsule 2  . clopidogrel (PLAVIX) 75 MG tablet Take 1 tablet (75 mg total) by mouth daily. 90 tablet 3  . diphenhydrAMINE (BENADRYL) 50 MG tablet Take 0.5 tablets (25 mg total) by mouth every 6 (six) hours as needed for itching (As needed for itching). 30 tablet 0  . DIPHENHYDRAMINE-ACETAMINOPHEN PO Take by mouth.    Marland Kitchen glucose blood (ACCU-CHEK SMARTVIEW) test strip Check blood sugar 4 times a day as instructed 125 each 12  . HEPARIN SODIUM, PORCINE, IV With hemodialysis    . insulin glargine (LANTUS) 100 UNIT/ML Solostar Pen Inject 5 Units into the skin at bedtime. (Patient taking differently: Inject 17 Units into the skin at bedtime.) 3 mL 0  . iron sucrose in sodium chloride 0.9 % 100 mL Q week with dialysis    . loperamide (IMODIUM A-D) 2 MG tablet Take 2 mg by mouth 4 (four) times daily as needed for diarrhea or loose stools.    . Methoxy PEG-Epoetin Beta (MIRCERA IJ) Mircera    . mupirocin ointment (BACTROBAN) 2 % Apply 1 application topically 2 (two) times daily. 30 g 1  . ondansetron (ZOFRAN ODT) 4 MG disintegrating tablet Take 1 tablet (4 mg total) by mouth every 8 (eight) hours as needed for nausea or vomiting. 20 tablet 0  . oxyCODONE-acetaminophen (PERCOCET) 10-325 MG tablet Take 1 tablet by mouth every 6 (six) hours as needed for pain. 15 tablet 0  . promethazine (PHENERGAN) 25 MG tablet Take  25 mg by mouth 3 (three) times daily as needed.    . sertraline (ZOLOFT) 50 MG tablet Take 1 tablet (50 mg total) by mouth daily. (Patient taking differently: Take 50 mg by mouth at bedtime.) 90 tablet 3  .  TUMS 500 MG chewable tablet Chew 2 tablets by mouth at bedtime.    . Vitamin D, Ergocalciferol, 50000 units CAPS Take by mouth.     No current facility-administered medications for this visit.    No Known Allergies   REVIEW OF SYSTEMS:   '[X]'$  denotes positive finding, '[ ]'$  denotes negative finding Cardiac  Comments:  Chest pain or chest pressure:    Shortness of breath upon exertion:    Short of breath when lying flat:    Irregular heart rhythm:        Vascular    Pain in calf, thigh, or hip brought on by ambulation:    Pain in feet at night that wakes you up from your sleep:  x   Blood clot in your veins:    Leg swelling:         Pulmonary    Oxygen at home:    Productive cough:     Wheezing:         Neurologic    Sudden weakness in arms or legs:     Sudden numbness in arms or legs:     Sudden onset of difficulty speaking or slurred speech:    Temporary loss of vision in one eye:     Problems with dizziness:         Gastrointestinal    Blood in stool:     Vomited blood:         Genitourinary    Burning when urinating:     Blood in urine:        Psychiatric    Major depression:         Hematologic    Bleeding problems:    Problems with blood clotting too easily:        Skin    Rashes or ulcers:        Constitutional    Fever or chills:      PHYSICAL EXAMINATION:  Vitals:   01/08/21 1123  BP: (!) 167/78  Pulse: 82  Resp: 20  Temp: 98.1 F (36.7 C)  TempSrc: Temporal  SpO2: 96%  Height: '5\' 5"'$  (1.651 m)    General:  WDWN in NAD; vital signs documented above Gait: In wheelchair and uses rolling walker HENT: WNL, normocephalic Pulmonary: normal non-labored breathing , without Rales, rhonchi,  wheezing Cardiac: regular HR  Skin: without  rashes Vascular Exam/Pulses: She has a trace palpable DP pulse. She has dampened monophasic PT and brisk peroneal signals. Extremities: with ischemic changes, without Gangrene , without cellulitis; with open wounds;  Musculoskeletal: no muscle wasting or atrophy  Neurologic: A&O X 3;  No focal weakness or paresthesias are detected Psychiatric:  The pt has Normal affect.  Right foot    Non-Invasive Vascular Imaging:   01/08/2021 ABI Findings:  +---------+------------------+-----+----------+--------+  Right  Rt Pressure (mmHg)IndexWaveform Comment   +---------+------------------+-----+----------+--------+  Brachial 212                      +---------+------------------+-----+----------+--------+  ATA   146        0.69 monophasic      +---------+------------------+-----+----------+--------+  PTA   109        0.51 monophasic      +---------+------------------+-----+----------+--------+  Great Toe0         0.00 Absent        +---------+------------------+-----+----------+--------+   +--------+------------------+-----+--------+---------+  Left  Lt Pressure (mmHg)IndexWaveformComment   +--------+------------------+-----+--------+---------+  Brachial  HD access  +--------+------------------+-----+--------+---------+     ASSESSMENT/PLAN:: 45 y.o. female here for follow up for right SFA and popliteal artery angioplasty/atherectomy and right AT angioplasty.  She continues to complain of right foot pain of the plantar surface. She is now seeing podiatry every two weeks for toe wounds. She has improvement in Doppler signals; however even, with maximal medical therapy she may not be able to heal her toe wounds.  Defer management of her toe wounds to podiatry.  We went over her aortogram results and explained this did not show further options for revascularization.   Follow-up in 6 months with repeat ABIs and right lower extremity arterial duplex.  Barbie Banner, PA-C [facility] [phone]  Clinic MD:   Dr. Scot Dock

## 2021-01-09 DIAGNOSIS — Z992 Dependence on renal dialysis: Secondary | ICD-10-CM | POA: Diagnosis not present

## 2021-01-09 DIAGNOSIS — D509 Iron deficiency anemia, unspecified: Secondary | ICD-10-CM | POA: Diagnosis not present

## 2021-01-09 DIAGNOSIS — L299 Pruritus, unspecified: Secondary | ICD-10-CM | POA: Diagnosis not present

## 2021-01-09 DIAGNOSIS — D689 Coagulation defect, unspecified: Secondary | ICD-10-CM | POA: Diagnosis not present

## 2021-01-09 DIAGNOSIS — D631 Anemia in chronic kidney disease: Secondary | ICD-10-CM | POA: Diagnosis not present

## 2021-01-09 DIAGNOSIS — N186 End stage renal disease: Secondary | ICD-10-CM | POA: Diagnosis not present

## 2021-01-09 DIAGNOSIS — N2581 Secondary hyperparathyroidism of renal origin: Secondary | ICD-10-CM | POA: Diagnosis not present

## 2021-01-11 DIAGNOSIS — N2581 Secondary hyperparathyroidism of renal origin: Secondary | ICD-10-CM | POA: Diagnosis not present

## 2021-01-11 DIAGNOSIS — D509 Iron deficiency anemia, unspecified: Secondary | ICD-10-CM | POA: Diagnosis not present

## 2021-01-11 DIAGNOSIS — D689 Coagulation defect, unspecified: Secondary | ICD-10-CM | POA: Diagnosis not present

## 2021-01-11 DIAGNOSIS — N186 End stage renal disease: Secondary | ICD-10-CM | POA: Diagnosis not present

## 2021-01-11 DIAGNOSIS — D631 Anemia in chronic kidney disease: Secondary | ICD-10-CM | POA: Diagnosis not present

## 2021-01-11 DIAGNOSIS — Z992 Dependence on renal dialysis: Secondary | ICD-10-CM | POA: Diagnosis not present

## 2021-01-11 DIAGNOSIS — L299 Pruritus, unspecified: Secondary | ICD-10-CM | POA: Diagnosis not present

## 2021-01-13 ENCOUNTER — Ambulatory Visit (INDEPENDENT_AMBULATORY_CARE_PROVIDER_SITE_OTHER): Payer: Medicare Other | Admitting: Podiatry

## 2021-01-13 ENCOUNTER — Other Ambulatory Visit: Payer: Self-pay

## 2021-01-13 DIAGNOSIS — L97512 Non-pressure chronic ulcer of other part of right foot with fat layer exposed: Secondary | ICD-10-CM

## 2021-01-13 DIAGNOSIS — I739 Peripheral vascular disease, unspecified: Secondary | ICD-10-CM | POA: Diagnosis not present

## 2021-01-13 DIAGNOSIS — Z89612 Acquired absence of left leg above knee: Secondary | ICD-10-CM | POA: Diagnosis not present

## 2021-01-13 MED ORDER — DOXYCYCLINE HYCLATE 100 MG PO TABS: 100.0000 mg | ORAL_TABLET | Freq: Two times a day (BID) | ORAL | 1 refills | Status: DC

## 2021-01-13 NOTE — Progress Notes (Signed)
Subjective:  44 y.o. female with PMHx of diabetes mellitus, PAD, AKA LLE presenting today for follow-up evaluation of ulcers that have developed to the right forefoot and toes.  She recently underwent revascularization procedure by [facility].  Patient has been applying the Betadine as instructed.  No new complaints at this time  Past Medical History:  Diagnosis Date  . Anal abscess    chronic  . Anxiety   . CKD (chronic kidney disease) stage 5, GFR less than 15 ml/min (HCC) 06/19/2016   . Suspect etiologies to include diabetes and hypertension though cannot exclude HIV-associated nephropathy. Advised her against from using excessive Goody powder.  . Constipation    trouble stoll coming out  . Depression 06/28/2006   Qualifier: Diagnosis of  By: Riccardo Dubin MD, Sherren Mocha    . Diabetes mellitus type 2 in obese ([place]) 06/28/1994  . DKA (diabetic ketoacidoses) 12/2014  . Dyspnea    uses oxygeb 2 liters per minute at dialysis  . End stage renal disease on dialysis ([place])   . Erosive esophagitis   . Esophageal reflux   . ESRD on hemodialysis ([place]) 05/02/2019  . Eye redness   . Gastroparesis    ? diabetic  . GERD (gastroesophageal reflux disease)   . History of blood transfusion 2019  . History of hypertension    off bp meds 2020 due to low bp with dialysis  . Human immunodeficiency virus (HIV) disease ([place]) 04/23/2016  . Metabolic bone disease 123XX123  . Moderate nonproliferative diabetic retinopathy of both eyes ([place]) 11/21/2014   11/14/14: Noted on retinal imaging; needs follow-up imaging in 6 months  05/22/16: Noted on retinal imaging again; needs follow-up imaging in 6 months  . Normocytic anemia   . Pruritus, unspecified 05/20/2019  . Renal failure (ARF), acute on chronic ([place]) 11/24/2018   tues, thurs sat [address] hs since sept 2020  . Shortness of breath 07/01/2019   when lays doan at night, musy prop head up on 2 regular and 2 small pillows  . Type 2 diabetes mellitus with  diabetic peripheral angiopathy without gangrene ([place]) 05/01/2019  . Type II diabetes mellitus ([place]) 1994   diagnosed around 40  . Wears dentures    lower  . Wound dehiscence   . Wound infection s/p L transmetatarsal amputation       Objective/Physical Exam General: The patient is alert and oriented x3 in no acute distress.  Dermatology:  Multiple wounds noted to the right fifth MTPJ, first and fifth digits of the right foot.  There is a very stable, dry eschar overlying the wounds.  Minimal slough, fibrin, and necrotic tissue noted. There is no exposed bone muscle-tendon ligament or joint. There is no malodor. Periwound integrity is intact. Skin is cool, dry and supple bilateral lower extremities.  Vascular: S/P angioplasty RLE. Dr. Deitra Mayo, [facility].  DOS: 12/13/2020.  Diminished flow RLE.  Non-Invasive Vascular Imaging:   01/08/2021 ABI Findings:  +---------+------------------+-----+----------+--------+  Right  Rt Pressure (mmHg)IndexWaveform Comment   +---------+------------------+-----+----------+--------+  Brachial 212                      +---------+------------------+-----+----------+--------+  ATA   146        0.69 monophasic      +---------+------------------+-----+----------+--------+  PTA   109        0.51 monophasic      +---------+------------------+-----+----------+--------+  Great Toe0         0.00 Absent        +---------+------------------+-----+----------+--------+   +--------+------------------+-----+--------+---------+  Left  Lt Pressure (mmHg)IndexWaveformComment   +--------+------------------+-----+--------+---------+  Brachial                HD access  +--------+------------------+-----+--------+---------+   Neurological: Epicritic and protective threshold diminished bilaterally.   Musculoskeletal  Exam: History of AKA LLE  Assessment: 1.  Ulcer right forefoot secondary to diabetes mellitus and PAD 2. diabetes mellitus w/ peripheral neuropathy 3. H/o AKA LLE 4.  CKD stage V 5.  Peripheral vascular disease [place]   Plan of Care:  1. Patient was evaluated. 2.  Currently the wounds are dry and stable with a well adhered eschar.  No need to rush immediately to surgery.  I would like to wait for additional weeks to see if her toe wounds have the potential for healing or continued gangrenous changes progress.  Patient understands she is at high risk of limb loss 3.  In the meantime, prescription for doxycycline 100 mg 2 times daily #20 1 refill 4.  Recommend Betadine with light dressing daily to the forefoot and interdigital areas 5.  Postoperative shoe dispensed today.   6.  Return to clinic in 4 weeks   Edrick Kins, DPM [facility]  Dr. Edrick Kins, DPM    [address], [place] [address]                Office [phone]  Fax [phone]

## 2021-01-14 ENCOUNTER — Other Ambulatory Visit: Payer: Self-pay

## 2021-01-14 DIAGNOSIS — Z992 Dependence on renal dialysis: Secondary | ICD-10-CM | POA: Diagnosis not present

## 2021-01-14 DIAGNOSIS — D509 Iron deficiency anemia, unspecified: Secondary | ICD-10-CM | POA: Diagnosis not present

## 2021-01-14 DIAGNOSIS — I739 Peripheral vascular disease, unspecified: Secondary | ICD-10-CM

## 2021-01-14 DIAGNOSIS — D689 Coagulation defect, unspecified: Secondary | ICD-10-CM | POA: Diagnosis not present

## 2021-01-14 DIAGNOSIS — N2581 Secondary hyperparathyroidism of renal origin: Secondary | ICD-10-CM | POA: Diagnosis not present

## 2021-01-14 DIAGNOSIS — N186 End stage renal disease: Secondary | ICD-10-CM | POA: Diagnosis not present

## 2021-01-14 DIAGNOSIS — E1129 Type 2 diabetes mellitus with other diabetic kidney complication: Secondary | ICD-10-CM | POA: Diagnosis not present

## 2021-01-15 ENCOUNTER — Encounter: Payer: Self-pay | Admitting: Physical Medicine and Rehabilitation

## 2021-01-18 DIAGNOSIS — Z992 Dependence on renal dialysis: Secondary | ICD-10-CM | POA: Diagnosis not present

## 2021-01-18 DIAGNOSIS — E1129 Type 2 diabetes mellitus with other diabetic kidney complication: Secondary | ICD-10-CM | POA: Diagnosis not present

## 2021-01-18 DIAGNOSIS — N186 End stage renal disease: Secondary | ICD-10-CM | POA: Diagnosis not present

## 2021-01-18 DIAGNOSIS — D509 Iron deficiency anemia, unspecified: Secondary | ICD-10-CM | POA: Diagnosis not present

## 2021-01-18 DIAGNOSIS — D689 Coagulation defect, unspecified: Secondary | ICD-10-CM | POA: Diagnosis not present

## 2021-01-18 DIAGNOSIS — N2581 Secondary hyperparathyroidism of renal origin: Secondary | ICD-10-CM | POA: Diagnosis not present

## 2021-01-21 ENCOUNTER — Other Ambulatory Visit (HOSPITAL_COMMUNITY): Payer: Self-pay

## 2021-01-21 DIAGNOSIS — N2581 Secondary hyperparathyroidism of renal origin: Secondary | ICD-10-CM | POA: Diagnosis not present

## 2021-01-21 DIAGNOSIS — L299 Pruritus, unspecified: Secondary | ICD-10-CM | POA: Diagnosis not present

## 2021-01-21 DIAGNOSIS — D631 Anemia in chronic kidney disease: Secondary | ICD-10-CM | POA: Diagnosis not present

## 2021-01-21 DIAGNOSIS — D689 Coagulation defect, unspecified: Secondary | ICD-10-CM | POA: Diagnosis not present

## 2021-01-21 DIAGNOSIS — N186 End stage renal disease: Secondary | ICD-10-CM | POA: Diagnosis not present

## 2021-01-21 DIAGNOSIS — D509 Iron deficiency anemia, unspecified: Secondary | ICD-10-CM | POA: Diagnosis not present

## 2021-01-21 DIAGNOSIS — Z992 Dependence on renal dialysis: Secondary | ICD-10-CM | POA: Diagnosis not present

## 2021-01-22 ENCOUNTER — Ambulatory Visit: Payer: Medicare Other | Admitting: Internal Medicine

## 2021-01-23 ENCOUNTER — Other Ambulatory Visit (HOSPITAL_COMMUNITY): Payer: Self-pay

## 2021-01-23 DIAGNOSIS — N186 End stage renal disease: Secondary | ICD-10-CM | POA: Diagnosis not present

## 2021-01-23 DIAGNOSIS — D509 Iron deficiency anemia, unspecified: Secondary | ICD-10-CM | POA: Diagnosis not present

## 2021-01-23 DIAGNOSIS — D631 Anemia in chronic kidney disease: Secondary | ICD-10-CM | POA: Diagnosis not present

## 2021-01-23 DIAGNOSIS — L299 Pruritus, unspecified: Secondary | ICD-10-CM | POA: Diagnosis not present

## 2021-01-23 DIAGNOSIS — N2581 Secondary hyperparathyroidism of renal origin: Secondary | ICD-10-CM | POA: Diagnosis not present

## 2021-01-23 DIAGNOSIS — D689 Coagulation defect, unspecified: Secondary | ICD-10-CM | POA: Diagnosis not present

## 2021-01-23 DIAGNOSIS — Z992 Dependence on renal dialysis: Secondary | ICD-10-CM | POA: Diagnosis not present

## 2021-01-23 MED FILL — Bictegravir-Emtricitabine-Tenofovir AF Tab 50-200-25 MG: ORAL | 30 days supply | Qty: 30 | Fill #2 | Status: AC

## 2021-01-28 ENCOUNTER — Telehealth: Payer: Self-pay

## 2021-01-28 ENCOUNTER — Other Ambulatory Visit: Payer: Self-pay

## 2021-01-28 ENCOUNTER — Encounter: Payer: Medicare Other | Admitting: Internal Medicine

## 2021-01-28 ENCOUNTER — Inpatient Hospital Stay (HOSPITAL_COMMUNITY)
Admission: EM | Admit: 2021-01-28 | Discharge: 2021-02-08 | DRG: 617 | Disposition: A | Discharge status: Rehabilitation Facility | Payer: Medicare Other | Attending: Internal Medicine | Admitting: Internal Medicine

## 2021-01-28 ENCOUNTER — Emergency Department (HOSPITAL_COMMUNITY): Payer: Medicare Other

## 2021-01-28 DIAGNOSIS — L97519 Non-pressure chronic ulcer of other part of right foot with unspecified severity: Secondary | ICD-10-CM | POA: Diagnosis present

## 2021-01-28 DIAGNOSIS — E119 Type 2 diabetes mellitus without complications: Secondary | ICD-10-CM | POA: Diagnosis present

## 2021-01-28 DIAGNOSIS — E1152 Type 2 diabetes mellitus with diabetic peripheral angiopathy with gangrene: Secondary | ICD-10-CM | POA: Diagnosis present

## 2021-01-28 DIAGNOSIS — E1143 Type 2 diabetes mellitus with diabetic autonomic (poly)neuropathy: Secondary | ICD-10-CM | POA: Diagnosis present

## 2021-01-28 DIAGNOSIS — Z810 Family history of intellectual disabilities: Secondary | ICD-10-CM

## 2021-01-28 DIAGNOSIS — Z20822 Contact with and (suspected) exposure to covid-19: Secondary | ICD-10-CM | POA: Diagnosis not present

## 2021-01-28 DIAGNOSIS — F419 Anxiety disorder, unspecified: Secondary | ICD-10-CM | POA: Diagnosis present

## 2021-01-28 DIAGNOSIS — E1169 Type 2 diabetes mellitus with other specified complication: Secondary | ICD-10-CM | POA: Diagnosis not present

## 2021-01-28 DIAGNOSIS — I96 Gangrene, not elsewhere classified: Secondary | ICD-10-CM | POA: Diagnosis present

## 2021-01-28 DIAGNOSIS — Z833 Family history of diabetes mellitus: Secondary | ICD-10-CM

## 2021-01-28 DIAGNOSIS — E11621 Type 2 diabetes mellitus with foot ulcer: Secondary | ICD-10-CM | POA: Diagnosis present

## 2021-01-28 DIAGNOSIS — N189 Chronic kidney disease, unspecified: Secondary | ICD-10-CM | POA: Diagnosis present

## 2021-01-28 DIAGNOSIS — Z79899 Other long term (current) drug therapy: Secondary | ICD-10-CM

## 2021-01-28 DIAGNOSIS — D631 Anemia in chronic kidney disease: Secondary | ICD-10-CM | POA: Diagnosis not present

## 2021-01-28 DIAGNOSIS — Z8719 Personal history of other diseases of the digestive system: Secondary | ICD-10-CM

## 2021-01-28 DIAGNOSIS — E1122 Type 2 diabetes mellitus with diabetic chronic kidney disease: Secondary | ICD-10-CM | POA: Diagnosis not present

## 2021-01-28 DIAGNOSIS — E669 Obesity, unspecified: Secondary | ICD-10-CM | POA: Diagnosis present

## 2021-01-28 DIAGNOSIS — K219 Gastro-esophageal reflux disease without esophagitis: Secondary | ICD-10-CM | POA: Diagnosis present

## 2021-01-28 DIAGNOSIS — Z87891 Personal history of nicotine dependence: Secondary | ICD-10-CM

## 2021-01-28 DIAGNOSIS — Z992 Dependence on renal dialysis: Secondary | ICD-10-CM | POA: Diagnosis not present

## 2021-01-28 DIAGNOSIS — D689 Coagulation defect, unspecified: Secondary | ICD-10-CM | POA: Diagnosis not present

## 2021-01-28 DIAGNOSIS — A419 Sepsis, unspecified organism: Secondary | ICD-10-CM

## 2021-01-28 DIAGNOSIS — L03115 Cellulitis of right lower limb: Secondary | ICD-10-CM | POA: Diagnosis present

## 2021-01-28 DIAGNOSIS — N186 End stage renal disease: Secondary | ICD-10-CM | POA: Diagnosis present

## 2021-01-28 DIAGNOSIS — Z89612 Acquired absence of left leg above knee: Secondary | ICD-10-CM | POA: Diagnosis not present

## 2021-01-28 DIAGNOSIS — K5900 Constipation, unspecified: Secondary | ICD-10-CM | POA: Diagnosis present

## 2021-01-28 DIAGNOSIS — N2581 Secondary hyperparathyroidism of renal origin: Secondary | ICD-10-CM | POA: Diagnosis not present

## 2021-01-28 DIAGNOSIS — Z7982 Long term (current) use of aspirin: Secondary | ICD-10-CM

## 2021-01-28 DIAGNOSIS — B20 Human immunodeficiency virus [HIV] disease: Secondary | ICD-10-CM | POA: Diagnosis present

## 2021-01-28 DIAGNOSIS — E785 Hyperlipidemia, unspecified: Secondary | ICD-10-CM | POA: Diagnosis present

## 2021-01-28 DIAGNOSIS — Z7902 Long term (current) use of antithrombotics/antiplatelets: Secondary | ICD-10-CM

## 2021-01-28 DIAGNOSIS — F329 Major depressive disorder, single episode, unspecified: Secondary | ICD-10-CM | POA: Diagnosis present

## 2021-01-28 DIAGNOSIS — Z9862 Peripheral vascular angioplasty status: Secondary | ICD-10-CM

## 2021-01-28 DIAGNOSIS — M609 Myositis, unspecified: Secondary | ICD-10-CM | POA: Diagnosis present

## 2021-01-28 DIAGNOSIS — Z89512 Acquired absence of left leg below knee: Secondary | ICD-10-CM | POA: Diagnosis not present

## 2021-01-28 DIAGNOSIS — E113393 Type 2 diabetes mellitus with moderate nonproliferative diabetic retinopathy without macular edema, bilateral: Secondary | ICD-10-CM | POA: Diagnosis present

## 2021-01-28 DIAGNOSIS — I517 Cardiomegaly: Secondary | ICD-10-CM | POA: Diagnosis not present

## 2021-01-28 DIAGNOSIS — Z794 Long term (current) use of insulin: Secondary | ICD-10-CM

## 2021-01-28 DIAGNOSIS — R600 Localized edema: Secondary | ICD-10-CM | POA: Diagnosis not present

## 2021-01-28 DIAGNOSIS — I739 Peripheral vascular disease, unspecified: Secondary | ICD-10-CM

## 2021-01-28 DIAGNOSIS — R9431 Abnormal electrocardiogram [ECG] [EKG]: Secondary | ICD-10-CM | POA: Diagnosis not present

## 2021-01-28 LAB — APTT: aPTT: 39 seconds — ABNORMAL HIGH (ref 24–36)

## 2021-01-28 LAB — CBC WITH DIFFERENTIAL/PLATELET
Abs Immature Granulocytes: 0.03 10*3/uL (ref 0.00–0.07)
Basophils Absolute: 0.1 10*3/uL (ref 0.0–0.1)
Basophils Relative: 1 %
Eosinophils Absolute: 0.1 10*3/uL (ref 0.0–0.5)
Eosinophils Relative: 2 %
HCT: 27.8 % — ABNORMAL LOW (ref 36.0–46.0)
Hemoglobin: 9 g/dL — ABNORMAL LOW (ref 12.0–15.0)
Immature Granulocytes: 1 %
Lymphocytes Relative: 15 %
Lymphs Abs: 1 10*3/uL (ref 0.7–4.0)
MCH: 30.3 pg (ref 26.0–34.0)
MCHC: 32.4 g/dL (ref 30.0–36.0)
MCV: 93.6 fL (ref 80.0–100.0)
Monocytes Absolute: 0.4 10*3/uL (ref 0.1–1.0)
Monocytes Relative: 7 %
Neutro Abs: 4.8 10*3/uL (ref 1.7–7.7)
Neutrophils Relative %: 74 %
Platelets: 375 10*3/uL (ref 150–400)
RBC: 2.97 MIL/uL — ABNORMAL LOW (ref 3.87–5.11)
RDW: 16.3 % — ABNORMAL HIGH (ref 11.5–15.5)
WBC: 6.4 10*3/uL (ref 4.0–10.5)
nRBC: 0 % (ref 0.0–0.2)

## 2021-01-28 LAB — COMPREHENSIVE METABOLIC PANEL
ALT: 9 U/L (ref 0–44)
AST: 14 U/L — ABNORMAL LOW (ref 15–41)
Albumin: 2.9 g/dL — ABNORMAL LOW (ref 3.5–5.0)
Alkaline Phosphatase: 54 U/L (ref 38–126)
Anion gap: 17 — ABNORMAL HIGH (ref 5–15)
BUN: 32 mg/dL — ABNORMAL HIGH (ref 6–20)
CO2: 32 mmol/L (ref 22–32)
Calcium: 8.8 mg/dL — ABNORMAL LOW (ref 8.9–10.3)
Chloride: 89 mmol/L — ABNORMAL LOW (ref 98–111)
Creatinine, Ser: 5.94 mg/dL — ABNORMAL HIGH (ref 0.44–1.00)
GFR, Estimated: 8 mL/min — ABNORMAL LOW (ref >60)
Glucose, Bld: 172 mg/dL — ABNORMAL HIGH (ref 70–99)
Potassium: 3.7 mmol/L (ref 3.5–5.1)
Sodium: 138 mmol/L (ref 135–145)
Total Bilirubin: 0.7 mg/dL (ref 0.3–1.2)
Total Protein: 8.4 g/dL — ABNORMAL HIGH (ref 6.5–8.1)

## 2021-01-28 LAB — LACTIC ACID, PLASMA: Lactic Acid, Venous: 1.4 mmol/L (ref 0.5–1.9)

## 2021-01-28 LAB — PROTIME-INR
INR: 1.3 — ABNORMAL HIGH (ref 0.8–1.2)
Prothrombin Time: 16.3 seconds — ABNORMAL HIGH (ref 11.4–15.2)

## 2021-01-28 LAB — I-STAT BETA HCG BLOOD, ED (MC, WL, AP ONLY): I-stat hCG, quantitative: <5 m[IU]/mL (ref <5)

## 2021-01-28 NOTE — Telephone Encounter (Signed)
Following message from [place] Staff:  [place] .Marland Kitchenpt called and canceled her appt that you scheduled for today with Dr Darrick Meigs '@2'$ :41 .. she stated that she could not find a ride so shewill going to ED

## 2021-01-28 NOTE — ED Provider Notes (Signed)
Emergency Medicine Provider Triage Evaluation Note  Kathy Frank , a 45 y.o. female  was evaluated in triage.  Pt complains of pain and infection in toes of the right foot.  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  States has been following with podiatry but infection is not improving in the right foot and podiatrist recommends she will likely require multiple amputations of the toes on the right foot.  Endorses new chills, nausea, and worsening pain in the foot as well as foul odor.  She is dialysis dependent 3 times per week, completed her session this morning without difficulty.  Review of Systems  Positive: Left foot pain, foul odor, infection.  Nausea, chills, constipation Negative: Chest pain, shortness of breath, palpitations, vomiting, diarrhea  Physical Exam  BP (!) 155/67 (BP Location: Right Arm)   Pulse 83   Temp 99.1 F (37.3 C) (Oral)   Resp 18   SpO2 100%  Gen:   Awake, no distress   Resp:  Normal effort  MSK:   Moves extremities without difficulty  Other:  Gangrenous appearing toes of the right foot with 1+ pedal pulse on the right.  Left AKA.  RRR, no M/R/G.  Palpable thrill in AV fistula in left upper arm.  Medical Decision Making  Medically screening exam initiated at 6:03 PM.  Appropriate orders placed.  [name] was informed that the remainder of the evaluation will be completed by another provider, this initial triage assessment does not replace that evaluation, and the importance of remaining in the ED until their evaluation is complete.  This chart was dictated using voice recognition software, Dragon. Despite the best efforts of this provider to proofread and correct errors, errors may still occur which can change documentation meaning.  Analgesia offered, patient declined.   Aura Dials 01/28/21 1805    Truddie Hidden, MD 01/28/21 [phone]

## 2021-01-28 NOTE — ED Triage Notes (Signed)
Pt with hx of L AKA for gangrene presents for further eval of 10 weeks of blistering and pain in R foot. Followed by podiatry for same. Has used cream, iodine, and has finished a course of abx for same since having a procedure to improve circulation in that foot.

## 2021-01-28 NOTE — Telephone Encounter (Signed)
Patient called in c/o pain in right foot. States it started out as blisters and she has been following with Dr. Amalia Hailey at [facility] x 6 weeks. States it is not getting better and she is worried it will develop gangrene and she will need to have foot amputated. This is what happened to left foot. Also, c/o chills when she receives dialysis. Appt given today with [facility] at 2:45. She is advised to head directly to ED if she cannot find transportation in time to today's appt. She will call back to cancel if she cannot find transportation.

## 2021-01-28 NOTE — Telephone Encounter (Signed)
ERROR

## 2021-01-29 ENCOUNTER — Inpatient Hospital Stay (HOSPITAL_COMMUNITY): Payer: Medicare Other

## 2021-01-29 ENCOUNTER — Other Ambulatory Visit (HOSPITAL_COMMUNITY): Payer: Self-pay

## 2021-01-29 DIAGNOSIS — Z794 Long term (current) use of insulin: Secondary | ICD-10-CM | POA: Diagnosis not present

## 2021-01-29 DIAGNOSIS — Z992 Dependence on renal dialysis: Secondary | ICD-10-CM | POA: Diagnosis not present

## 2021-01-29 DIAGNOSIS — Z9862 Peripheral vascular angioplasty status: Secondary | ICD-10-CM | POA: Diagnosis not present

## 2021-01-29 DIAGNOSIS — K219 Gastro-esophageal reflux disease without esophagitis: Secondary | ICD-10-CM | POA: Diagnosis not present

## 2021-01-29 DIAGNOSIS — I70261 Atherosclerosis of native arteries of extremities with gangrene, right leg: Secondary | ICD-10-CM | POA: Diagnosis not present

## 2021-01-29 DIAGNOSIS — Z87891 Personal history of nicotine dependence: Secondary | ICD-10-CM | POA: Diagnosis not present

## 2021-01-29 DIAGNOSIS — D62 Acute posthemorrhagic anemia: Secondary | ICD-10-CM | POA: Diagnosis not present

## 2021-01-29 DIAGNOSIS — L03115 Cellulitis of right lower limb: Secondary | ICD-10-CM | POA: Diagnosis not present

## 2021-01-29 DIAGNOSIS — Z89612 Acquired absence of left leg above knee: Secondary | ICD-10-CM | POA: Diagnosis not present

## 2021-01-29 DIAGNOSIS — E1169 Type 2 diabetes mellitus with other specified complication: Secondary | ICD-10-CM | POA: Diagnosis not present

## 2021-01-29 DIAGNOSIS — E1129 Type 2 diabetes mellitus with other diabetic kidney complication: Secondary | ICD-10-CM | POA: Diagnosis not present

## 2021-01-29 DIAGNOSIS — I120 Hypertensive chronic kidney disease with stage 5 chronic kidney disease or end stage renal disease: Secondary | ICD-10-CM | POA: Diagnosis not present

## 2021-01-29 DIAGNOSIS — Z89512 Acquired absence of left leg below knee: Secondary | ICD-10-CM | POA: Diagnosis not present

## 2021-01-29 DIAGNOSIS — E785 Hyperlipidemia, unspecified: Secondary | ICD-10-CM | POA: Diagnosis not present

## 2021-01-29 DIAGNOSIS — B20 Human immunodeficiency virus [HIV] disease: Secondary | ICD-10-CM | POA: Diagnosis present

## 2021-01-29 DIAGNOSIS — D631 Anemia in chronic kidney disease: Secondary | ICD-10-CM | POA: Diagnosis not present

## 2021-01-29 DIAGNOSIS — K5901 Slow transit constipation: Secondary | ICD-10-CM | POA: Diagnosis not present

## 2021-01-29 DIAGNOSIS — Z4781 Encounter for orthopedic aftercare following surgical amputation: Secondary | ICD-10-CM | POA: Diagnosis not present

## 2021-01-29 DIAGNOSIS — Z8719 Personal history of other diseases of the digestive system: Secondary | ICD-10-CM | POA: Diagnosis not present

## 2021-01-29 DIAGNOSIS — K5900 Constipation, unspecified: Secondary | ICD-10-CM | POA: Diagnosis not present

## 2021-01-29 DIAGNOSIS — Z7982 Long term (current) use of aspirin: Secondary | ICD-10-CM | POA: Diagnosis not present

## 2021-01-29 DIAGNOSIS — N2581 Secondary hyperparathyroidism of renal origin: Secondary | ICD-10-CM | POA: Diagnosis not present

## 2021-01-29 DIAGNOSIS — M7989 Other specified soft tissue disorders: Secondary | ICD-10-CM | POA: Diagnosis not present

## 2021-01-29 DIAGNOSIS — G8918 Other acute postprocedural pain: Secondary | ICD-10-CM | POA: Diagnosis not present

## 2021-01-29 DIAGNOSIS — I96 Gangrene, not elsewhere classified: Secondary | ICD-10-CM | POA: Diagnosis not present

## 2021-01-29 DIAGNOSIS — F329 Major depressive disorder, single episode, unspecified: Secondary | ICD-10-CM | POA: Diagnosis present

## 2021-01-29 DIAGNOSIS — E1143 Type 2 diabetes mellitus with diabetic autonomic (poly)neuropathy: Secondary | ICD-10-CM | POA: Diagnosis not present

## 2021-01-29 DIAGNOSIS — M898X9 Other specified disorders of bone, unspecified site: Secondary | ICD-10-CM | POA: Diagnosis not present

## 2021-01-29 DIAGNOSIS — Z810 Family history of intellectual disabilities: Secondary | ICD-10-CM | POA: Diagnosis not present

## 2021-01-29 DIAGNOSIS — F32A Depression, unspecified: Secondary | ICD-10-CM | POA: Diagnosis not present

## 2021-01-29 DIAGNOSIS — E669 Obesity, unspecified: Secondary | ICD-10-CM | POA: Diagnosis present

## 2021-01-29 DIAGNOSIS — Z7902 Long term (current) use of antithrombotics/antiplatelets: Secondary | ICD-10-CM | POA: Diagnosis not present

## 2021-01-29 DIAGNOSIS — E1151 Type 2 diabetes mellitus with diabetic peripheral angiopathy without gangrene: Secondary | ICD-10-CM | POA: Diagnosis not present

## 2021-01-29 DIAGNOSIS — I739 Peripheral vascular disease, unspecified: Secondary | ICD-10-CM | POA: Diagnosis not present

## 2021-01-29 DIAGNOSIS — Z20822 Contact with and (suspected) exposure to covid-19: Secondary | ICD-10-CM | POA: Diagnosis not present

## 2021-01-29 DIAGNOSIS — N186 End stage renal disease: Secondary | ICD-10-CM | POA: Diagnosis not present

## 2021-01-29 DIAGNOSIS — L97919 Non-pressure chronic ulcer of unspecified part of right lower leg with unspecified severity: Secondary | ICD-10-CM | POA: Diagnosis not present

## 2021-01-29 DIAGNOSIS — E1152 Type 2 diabetes mellitus with diabetic peripheral angiopathy with gangrene: Secondary | ICD-10-CM | POA: Diagnosis not present

## 2021-01-29 DIAGNOSIS — E11649 Type 2 diabetes mellitus with hypoglycemia without coma: Secondary | ICD-10-CM | POA: Diagnosis not present

## 2021-01-29 DIAGNOSIS — F419 Anxiety disorder, unspecified: Secondary | ICD-10-CM | POA: Diagnosis present

## 2021-01-29 DIAGNOSIS — E1122 Type 2 diabetes mellitus with diabetic chronic kidney disease: Secondary | ICD-10-CM | POA: Diagnosis not present

## 2021-01-29 DIAGNOSIS — E11621 Type 2 diabetes mellitus with foot ulcer: Secondary | ICD-10-CM | POA: Diagnosis not present

## 2021-01-29 DIAGNOSIS — E113393 Type 2 diabetes mellitus with moderate nonproliferative diabetic retinopathy without macular edema, bilateral: Secondary | ICD-10-CM | POA: Diagnosis not present

## 2021-01-29 DIAGNOSIS — Z89511 Acquired absence of right leg below knee: Secondary | ICD-10-CM | POA: Diagnosis not present

## 2021-01-29 DIAGNOSIS — L97519 Non-pressure chronic ulcer of other part of right foot with unspecified severity: Secondary | ICD-10-CM | POA: Diagnosis not present

## 2021-01-29 DIAGNOSIS — E119 Type 2 diabetes mellitus without complications: Secondary | ICD-10-CM | POA: Diagnosis not present

## 2021-01-29 DIAGNOSIS — Z833 Family history of diabetes mellitus: Secondary | ICD-10-CM | POA: Diagnosis not present

## 2021-01-29 LAB — LIPID PANEL
Cholesterol: 155 mg/dL (ref 0–200)
HDL: 37 mg/dL — ABNORMAL LOW (ref >40)
LDL Cholesterol: 98 mg/dL (ref 0–99)
Total CHOL/HDL Ratio: 4.2 RATIO
Triglycerides: 98 mg/dL (ref <150)
VLDL: 20 mg/dL (ref 0–40)

## 2021-01-29 LAB — CBC
HCT: 29.8 % — ABNORMAL LOW (ref 36.0–46.0)
Hemoglobin: 9.5 g/dL — ABNORMAL LOW (ref 12.0–15.0)
MCH: 30.7 pg (ref 26.0–34.0)
MCHC: 31.9 g/dL (ref 30.0–36.0)
MCV: 96.4 fL (ref 80.0–100.0)
Platelets: 349 10*3/uL (ref 150–400)
RBC: 3.09 MIL/uL — ABNORMAL LOW (ref 3.87–5.11)
RDW: 16.7 % — ABNORMAL HIGH (ref 11.5–15.5)
WBC: 6.4 10*3/uL (ref 4.0–10.5)
nRBC: 0 % (ref 0.0–0.2)

## 2021-01-29 LAB — COMPREHENSIVE METABOLIC PANEL
ALT: 9 U/L (ref 0–44)
AST: 11 U/L — ABNORMAL LOW (ref 15–41)
Albumin: 2.8 g/dL — ABNORMAL LOW (ref 3.5–5.0)
Alkaline Phosphatase: 54 U/L (ref 38–126)
Anion gap: 20 — ABNORMAL HIGH (ref 5–15)
BUN: 46 mg/dL — ABNORMAL HIGH (ref 6–20)
CO2: 27 mmol/L (ref 22–32)
Calcium: 8.8 mg/dL — ABNORMAL LOW (ref 8.9–10.3)
Chloride: 90 mmol/L — ABNORMAL LOW (ref 98–111)
Creatinine, Ser: 8.06 mg/dL — ABNORMAL HIGH (ref 0.44–1.00)
GFR, Estimated: 6 mL/min — ABNORMAL LOW (ref >60)
Glucose, Bld: 139 mg/dL — ABNORMAL HIGH (ref 70–99)
Potassium: 4.1 mmol/L (ref 3.5–5.1)
Sodium: 137 mmol/L (ref 135–145)
Total Bilirubin: 0.8 mg/dL (ref 0.3–1.2)
Total Protein: 7.7 g/dL (ref 6.5–8.1)

## 2021-01-29 LAB — RESP PANEL BY RT-PCR (FLU A&B, COVID) ARPGX2
Influenza A by PCR: NEGATIVE
Influenza B by PCR: NEGATIVE
SARS Coronavirus 2 by RT PCR: NEGATIVE

## 2021-01-29 LAB — HEMOGLOBIN A1C
Hgb A1c MFr Bld: 6.7 % — ABNORMAL HIGH (ref 4.8–5.6)
Mean Plasma Glucose: 145.59 mg/dL

## 2021-01-29 LAB — CBG MONITORING, ED
Glucose-Capillary: 135 mg/dL — ABNORMAL HIGH (ref 70–99)
Glucose-Capillary: 110 mg/dL — ABNORMAL HIGH (ref 70–99)
Glucose-Capillary: 143 mg/dL — ABNORMAL HIGH (ref 70–99)

## 2021-01-29 LAB — FERRITIN: Ferritin: 421 ng/mL — ABNORMAL HIGH (ref 11–307)

## 2021-01-29 MED ORDER — ACETAMINOPHEN 325 MG PO TABS
650.0000 mg | ORAL_TABLET | Freq: Four times a day (QID) | ORAL | Status: DC | PRN
  Administered 2021-01-31: 650 mg via ORAL
  Filled 2021-01-29: qty 2

## 2021-01-29 MED ORDER — ACETAMINOPHEN 650 MG RE SUPP: 650.0000 mg | Freq: Four times a day (QID) | RECTAL | Status: DC | PRN

## 2021-01-29 MED ORDER — HYDROMORPHONE HCL 1 MG/ML IJ SOLN
0.5000 mg | INTRAMUSCULAR | Status: DC | PRN
  Administered 2021-01-29 – 2021-02-01 (×9): 0.5 mg via INTRAVENOUS
  Filled 2021-01-29 (×9): qty 1

## 2021-01-29 MED ORDER — CALCIUM ACETATE (PHOS BINDER) 667 MG PO CAPS
1334.0000 mg | ORAL_CAPSULE | Freq: Three times a day (TID) | ORAL | Status: DC
  Administered 2021-01-29 – 2021-02-06 (×20): 1334 mg via ORAL
  Filled 2021-01-29 (×20): qty 2

## 2021-01-29 MED ORDER — HYDROMORPHONE HCL 1 MG/ML IJ SOLN
0.5000 mg | Freq: Once | INTRAMUSCULAR | Status: AC
  Administered 2021-01-29: 0.5 mg via INTRAVENOUS
  Filled 2021-01-29: qty 1

## 2021-01-29 MED ORDER — ONDANSETRON HCL 4 MG/2ML IJ SOLN
4.0000 mg | Freq: Four times a day (QID) | INTRAMUSCULAR | Status: DC | PRN
  Administered 2021-01-29 – 2021-02-05 (×10): 4 mg via INTRAVENOUS
  Filled 2021-01-29 (×11): qty 2

## 2021-01-29 MED ORDER — ATORVASTATIN CALCIUM 40 MG PO TABS
40.0000 mg | ORAL_TABLET | Freq: Every day | ORAL | Status: DC
  Administered 2021-01-29 – 2021-02-08 (×10): 40 mg via ORAL
  Filled 2021-01-29 (×10): qty 1

## 2021-01-29 MED ORDER — HEPARIN SODIUM (PORCINE) 5000 UNIT/ML IJ SOLN
5000.0000 [IU] | Freq: Three times a day (TID) | INTRAMUSCULAR | Status: DC
  Administered 2021-01-29 – 2021-01-30 (×3): 5000 [IU] via SUBCUTANEOUS
  Filled 2021-01-29 (×3): qty 1

## 2021-01-29 MED ORDER — INSULIN ASPART 100 UNIT/ML IJ SOLN
0.0000 [IU] | Freq: Three times a day (TID) | INTRAMUSCULAR | Status: DC
  Administered 2021-01-29: 2 [IU] via SUBCUTANEOUS
  Administered 2021-01-30: 5 [IU] via SUBCUTANEOUS
  Administered 2021-01-31: 11 [IU] via SUBCUTANEOUS
  Administered 2021-02-01: 3 [IU] via SUBCUTANEOUS
  Administered 2021-02-01: 5 [IU] via SUBCUTANEOUS
  Administered 2021-02-01: 3 [IU] via SUBCUTANEOUS
  Administered 2021-02-02 (×2): 2 [IU] via SUBCUTANEOUS
  Administered 2021-02-02 – 2021-02-03 (×3): 3 [IU] via SUBCUTANEOUS
  Administered 2021-02-03: 2 [IU] via SUBCUTANEOUS
  Administered 2021-02-04 (×2): 5 [IU] via SUBCUTANEOUS
  Administered 2021-02-05: 2 [IU] via SUBCUTANEOUS
  Administered 2021-02-05: 3 [IU] via SUBCUTANEOUS
  Administered 2021-02-06: 2 [IU] via SUBCUTANEOUS
  Administered 2021-02-06: 3 [IU] via SUBCUTANEOUS
  Administered 2021-02-07: 2 [IU] via SUBCUTANEOUS
  Administered 2021-02-07: 3 [IU] via SUBCUTANEOUS
  Administered 2021-02-07 – 2021-02-08 (×2): 2 [IU] via SUBCUTANEOUS

## 2021-01-29 MED ORDER — SERTRALINE HCL 50 MG PO TABS
50.0000 mg | ORAL_TABLET | Freq: Every day | ORAL | Status: DC
  Administered 2021-01-29 – 2021-02-07 (×9): 50 mg via ORAL
  Filled 2021-01-29 (×10): qty 1

## 2021-01-29 MED ORDER — SODIUM CHLORIDE 0.9 % IV SOLN: 2.0000 g | INTRAVENOUS | Status: DC

## 2021-01-29 MED ORDER — OXYCODONE-ACETAMINOPHEN 5-325 MG PO TABS
1.0000 | ORAL_TABLET | Freq: Once | ORAL | Status: AC
  Administered 2021-01-29: 1 via ORAL
  Filled 2021-01-29: qty 1

## 2021-01-29 MED ORDER — SODIUM CHLORIDE 0.9 % IV SOLN
2.0000 g | Freq: Once | INTRAVENOUS | Status: AC
  Administered 2021-01-29: 2 g via INTRAVENOUS
  Filled 2021-01-29: qty 2

## 2021-01-29 MED ORDER — INSULIN GLARGINE 100 UNIT/ML ~~LOC~~ SOLN
5.0000 [IU] | Freq: Every day | SUBCUTANEOUS | Status: DC
  Administered 2021-01-29 – 2021-02-07 (×9): 5 [IU] via SUBCUTANEOUS
  Filled 2021-01-29 (×12): qty 0.05

## 2021-01-29 MED ORDER — CHLORHEXIDINE GLUCONATE CLOTH 2 % EX PADS
6.0000 | MEDICATED_PAD | Freq: Every day | CUTANEOUS | Status: DC
  Administered 2021-02-01 – 2021-02-08 (×7): 6 via TOPICAL

## 2021-01-29 MED ORDER — VANCOMYCIN HCL IN DEXTROSE 750-5 MG/150ML-% IV SOLN
750.0000 mg | INTRAVENOUS | Status: DC
  Filled 2021-01-29: qty 150

## 2021-01-29 MED ORDER — ASPIRIN EC 81 MG PO TBEC
81.0000 mg | DELAYED_RELEASE_TABLET | Freq: Every day | ORAL | Status: DC
  Administered 2021-01-29 – 2021-02-08 (×10): 81 mg via ORAL
  Filled 2021-01-29 (×10): qty 1

## 2021-01-29 MED ORDER — OXYCODONE HCL 5 MG PO TABS: 5.0000 mg | ORAL_TABLET | Freq: Four times a day (QID) | ORAL | Status: DC | PRN

## 2021-01-29 MED ORDER — POLYETHYLENE GLYCOL 3350 17 G PO PACK
17.0000 g | PACK | Freq: Every day | ORAL | Status: DC | PRN
  Administered 2021-02-03: 17 g via ORAL
  Filled 2021-01-29: qty 1

## 2021-01-29 MED ORDER — VANCOMYCIN HCL 1500 MG/300ML IV SOLN
1500.0000 mg | Freq: Once | INTRAVENOUS | Status: AC
  Administered 2021-01-29: 1500 mg via INTRAVENOUS
  Filled 2021-01-29: qty 300

## 2021-01-29 MED ORDER — SENNA 8.6 MG PO TABS
1.0000 | ORAL_TABLET | Freq: Two times a day (BID) | ORAL | Status: DC
  Administered 2021-01-29 – 2021-02-04 (×11): 8.6 mg via ORAL
  Filled 2021-01-29 (×13): qty 1

## 2021-01-29 MED ORDER — BICTEGRAVIR-EMTRICITAB-TENOFOV 50-200-25 MG PO TABS
1.0000 | ORAL_TABLET | Freq: Every day | ORAL | Status: DC
  Administered 2021-01-30 – 2021-02-07 (×8): 1 via ORAL
  Filled 2021-01-29 (×10): qty 1

## 2021-01-29 MED ORDER — LORAZEPAM 2 MG/ML IJ SOLN
1.0000 mg | Freq: Once | INTRAMUSCULAR | Status: AC | PRN
  Administered 2021-01-29: 1 mg via INTRAVENOUS
  Filled 2021-01-29: qty 1

## 2021-01-29 MED ORDER — OXYCODONE HCL 5 MG PO TABS
5.0000 mg | ORAL_TABLET | Freq: Four times a day (QID) | ORAL | Status: DC | PRN
  Administered 2021-01-30 – 2021-01-31 (×3): 5 mg via ORAL
  Filled 2021-01-29 (×3): qty 1

## 2021-01-29 MED ORDER — BICTEGRAVIR-EMTRICITAB-TENOFOV 50-200-25 MG PO TABS
1.0000 | ORAL_TABLET | Freq: Every day | ORAL | Status: DC
  Filled 2021-01-29: qty 1

## 2021-01-29 NOTE — ED Notes (Signed)
Unable to collect second set of blood cultures d/t difficulty obtaining IV

## 2021-01-29 NOTE — ED Notes (Signed)
Pt removed monitoring devices again

## 2021-01-29 NOTE — ED Notes (Signed)
Pt has been placed back onto VS monitoring several time by this RN and NTs. Requested pt please keep monitoring cord on. Pt is disconnect. VS unavailable

## 2021-01-29 NOTE — ED Notes (Signed)
Pt eating lunch tray  

## 2021-01-29 NOTE — ED Notes (Signed)
Second IV consult placed. IV not working. Pump continues to say occluded.

## 2021-01-29 NOTE — ED Notes (Signed)
Advised pt meal tray was here and pt went back to sleep at this time

## 2021-01-29 NOTE — Progress Notes (Signed)
Pharmacy Antibiotic Note  Kathy Frank is a 45 y.o. female admitted on 01/28/2021 with  foot wound infection .  Pharmacy has been consulted for vancomycin and cefepime dosing.  Plan: Vancomycin '1500mg'$  x1 then 750 IV every HD.  Goal pre-HD level 15-25 mcg/mL. Cefepime 2g IV now and after each HD.  Height: '5\' 5"'$  (165.1 cm) Weight: 77.1 kg (170 lb) IBW/kg (Calculated) : 57  Temp (24hrs), Avg:98.4 F (36.9 C), Min:97.7 F (36.5 C), Max:99.1 F (37.3 C)  Recent Labs  Lab 01/28/21 1803  WBC 6.4  CREATININE 5.94*  LATICACIDVEN 1.4    Estimated Creatinine Clearance: 12.4 mL/min (A) (by C-G formula based on SCr of 5.94 mg/dL (H)).    No Known Allergies   Thank you for allowing pharmacy to be a part of this patient's care.  Wynona Neat, PharmD, BCPS  01/29/2021 3:10 AM

## 2021-01-29 NOTE — ED Notes (Signed)
Received verbal report from Dora at this time

## 2021-01-29 NOTE — ED Notes (Signed)
Patient transported to MRI 

## 2021-01-29 NOTE — Progress Notes (Signed)
Received consult to check new PIV. Pump beeping occlusion. Pt denies pain, brisk blood return noted. Flushes without resistance. Site unremarkable. Suspect tubing/ extension getting twisted when pt repositions.

## 2021-01-29 NOTE — ED Notes (Signed)
Provided pt with crackers  

## 2021-01-29 NOTE — ED Provider Notes (Signed)
[facility] EMERGENCY DEPARTMENT Provider Note   CSN: [id number] Arrival date & time: 01/28/21  1707     History No chief complaint on file.   Kathy Frank is a 45 y.o. female with a history of ESRD on HD, HIV, diabetes mellitus type 2, PAD, AKA of LLE who presents to the emergency department with a chief complaint of right foot pain.  The patient endorses constant pain to the right foot with swelling that began over the last 3 weeks.  She reports that symptoms have been gradually worsening since onset.  She has now having difficulty bearing weight on her right foot due to her symptoms.  She has been taking Tylenol for pain, but the pain is unbearable, which prompted her visit to the emergency department today.  She has been followed by podiatry and was last seen on 6/6.  She has been taking doxycycline until 2 days ago when she finished her most recent course.  She was advised to use Betadine daily, which she has been compliant with.  She is concerned as she developed gangrene on her left foot and ultimately underwent multiple amputations due to worsening changes, which she is hoping to avoid with her right foot.  She reports that she noticed a scant amount of blood on her sock today, but previously has not had any drainage or bleeding from the right foot or toes.  She denies swelling, redness, or warmth to the right lower leg, fever, chills, right ankle or knee pain.  No other treatment prior to arrival.  The history is provided by the patient and medical records. No language interpreter was used.      Past Medical History:  Diagnosis Date   Anal abscess    chronic   Anxiety    CKD (chronic kidney disease) stage 5, GFR less than 15 ml/min (HCC) 06/19/2016   . Suspect etiologies to include diabetes and hypertension though cannot exclude HIV-associated nephropathy. Advised her against from using excessive Goody powder.   Constipation    trouble stoll coming out    Depression 06/28/2006   Qualifier: Diagnosis of  By: Riccardo Dubin MD, Todd     Diabetes mellitus type 2 in obese ([place]) 06/28/1994   DKA (diabetic ketoacidoses) 12/2014   Dyspnea    uses oxygeb 2 liters per minute at dialysis   End stage renal disease on dialysis [facility])    Erosive esophagitis    Esophageal reflux    ESRD on hemodialysis ([place]) 05/02/2019   Eye redness    Gastroparesis    ? diabetic   GERD (gastroesophageal reflux disease)    History of blood transfusion 2019   History of hypertension    off bp meds 2020 due to low bp with dialysis   Human immunodeficiency virus (HIV) disease ([place]) [id number]   Metabolic bone disease [id number]   Moderate nonproliferative diabetic retinopathy of both eyes ([place]) 11/21/2014   11/14/14: Noted on retinal imaging; needs follow-up imaging in 6 months  05/22/16: Noted on retinal imaging again; needs follow-up imaging in 6 months   Normocytic anemia    Pruritus, unspecified 05/20/2019   Renal failure (ARF), acute on chronic ([place]) 11/24/2018   tues, thurs sat [address] hs since sept 2020   Shortness of breath 07/01/2019   when lays doan at night, musy prop head up on 2 regular and 2 small pillows   Type 2 diabetes mellitus with diabetic peripheral angiopathy without gangrene ([place]) 05/01/2019   Type II diabetes  mellitus ([place]) 1994   diagnosed around 1994   Wears dentures    lower   Wound dehiscence    Wound infection s/p L transmetatarsal amputation     Patient Active Problem List   Diagnosis Date Noted   Gangrene of right foot ([place]) 01/29/2021   Peripheral arterial disease ([place]) 01/29/2021   Anxiety 05/28/2020   Headache, unspecified 05/02/2020   Patient's other noncompliance with medication regimen 12/28/2019   Carbuncle and furuncle of buttock 12/15/2019   Other hypoglycemia 12/12/2019   Dyspnea 07/01/2019   Iron deficiency anemia, unspecified [id number]   Complication of vascular dialysis catheter 04/24/2019   Major depressive disorder,  single episode, unspecified 04/24/2019   Secondary hyperparathyroidism of renal origin ([place]) 04/24/2019   S/P AKA (above knee amputation), left ([place]) 12/16/2018   Anemia due to chronic kidney disease    Moderate protein-calorie malnutrition (HCC)    Adjustment disorder with mixed anxiety and depressed mood 08/15/2018   ESRD (end stage renal disease) ([place])    Fecal incontinence 02/04/2018   Healthcare maintenance 09/17/2017   Aortic atherosclerosis ([place]) 09/30/2016   Diabetic foot ulcer associated with diabetes mellitus due to underlying condition ([place]) 07/08/2016   Human immunodeficiency virus (HIV) disease ([place]) 04/23/2016   Gastroparesis    Moderate nonproliferative diabetic retinopathy of both eyes ([place]) 11/21/2014   Esophageal reflux    Microscopic hematuria 10/31/2014   Hypertension 11/16/2013   Tobacco use disorder 06/28/2006   Depression 06/28/2006   Diabetes mellitus type 2 in obese ([place]) 06/28/1994    Past Surgical History:  Procedure Laterality Date   ABDOMINAL AORTOGRAM W/LOWER EXTREMITY N/A 12/13/2020   Procedure: ABDOMINAL AORTOGRAM W/LOWER EXTREMITY;  Surgeon: Angelia Mould, MD;  Location: [facility];  Service: Cardiovascular;  Laterality: N/A;   AMPUTATION Left 07/08/2018   Procedure: AMPUTATION FORTH RAY LEFT FOOT;  Surgeon: [name], MD;  Location: [facility];  Service: Orthopedics;  Laterality: Left;   AMPUTATION Left 08/09/2018   Procedure: Left Transmetatarsal Amputation;  Surgeon: [name], MD;  Location: [facility];  Service: Orthopedics;  Laterality: Left;   AMPUTATION Left 10/08/2018   Procedure: LEFT BELOW KNEE AMPUTATION;  Surgeon: [name], MD;  Location: [facility];  Service: Orthopedics;  Laterality: Left;   AMPUTATION Left 10/28/2018   Procedure: REVISION BELOW KNEE AMPUTATION;  Surgeon: [name], MD;  Location: [facility];  Service: Orthopedics;  Laterality: Left;   AMPUTATION Left 12/16/2018   Procedure: LEFT ABOVE KNEE AMPUTATION;   Surgeon: [name], MD;  Location: [facility];  Service: Orthopedics;  Laterality: Left;   AV FISTULA PLACEMENT Left 10/14/2018   Procedure: Arteriovenous (Av) Fistula Creation Left Arm;  Surgeon: [name], MD;  Location: [facility];  Service: Vascular;  Laterality: Left;   [place] Left 04/14/2019   Procedure: BASILIC VEIN TRANSPOSITION SECOND STAGE LEFT ARM;  Surgeon: Rosetta Posner, MD;  Location: [facility];  Service: Vascular;  Laterality: Left;   INCISION AND DRAINAGE ABSCESS N/A 10/24/2020   Procedure: exicision of hydradenitis;  Surgeon: Leighton Ruff, MD;  Location: [facility];  Service: General;  Laterality: N/A;  45 min   INSERTION OF DIALYSIS CATHETER Right 04/14/2019   Procedure: INSERTION OF DIALYSIS CATHETER;  Surgeon: Rosetta Posner, MD;  Location: [facility];  Service: Vascular;  Laterality: Right;   LOWER EXTREMITY ANGIOGRAPHY N/A 07/05/2018   Procedure: LOWER EXTREMITY ANGIOGRAPHY;  Surgeon: Serafina Mitchell, MD;  Location: [facility];  Service: Cardiovascular;  Laterality: N/A;   PERIPHERAL VASCULAR ATHERECTOMY Right 12/13/2020   Procedure: PERIPHERAL VASCULAR ATHERECTOMY;  Surgeon: Angelia Mould, MD;  Location: [facility];  Service: Cardiovascular;  Laterality: Right;  Superficial femoral   PERIPHERAL VASCULAR BALLOON ANGIOPLASTY Left 07/05/2018   Procedure: PERIPHERAL VASCULAR BALLOON ANGIOPLASTY;  Surgeon: Serafina Mitchell, MD;  Location: [facility];  Service: Cardiovascular;  Laterality: Left;  SFA   PERIPHERAL VASCULAR BALLOON ANGIOPLASTY Right 12/13/2020   Procedure: PERIPHERAL VASCULAR BALLOON ANGIOPLASTY;  Surgeon: Angelia Mould, MD;  Location: [facility];  Service: Cardiovascular;  Laterality: Right;  Peroneal artery, anterior tibial artery   RECTAL EXAM UNDER ANESTHESIA N/A 10/24/2020   Procedure: EXAM UNDER ANESTHESIA;  Surgeon: Leighton Ruff, MD;  Location[facility];  Service:  General;  Laterality: N/A;   STUMP REVISION Left 10/19/2018   Procedure: REVISION LEFT BELOW KNEE AMPUTATION;  Surgeon: [name], MD;  Location: [facility];  Service: Orthopedics;  Laterality: Left;   TUBAL LIGATION  2002     OB History   No obstetric history on file.     Family History  Problem Relation Age of Onset   Diabetes Mother    Diabetes Brother    Diabetes Daughter    Diabetes Daughter    Mental retardation Brother        died from PNA   Diabetes Maternal Grandmother     Social History   Tobacco Use   Smoking status: Former    Packs/day: 0.10    Years: 10.00    Pack years: 1.00    Types: Cigarettes    Start date: 11/17/2013    Quit date: 09/09/2014    Years since quitting: 6.3   Smokeless tobacco: Never  Vaping Use   Vaping Use: Never used  Substance Use Topics   Alcohol use: Not Currently   Drug use: Not Currently    Types: Marijuana    Comment: marijuana last used 1 week 10-21-2020    Home Medications Prior to Admission medications   Medication Sig Start Date End Date Taking? Authorizing Provider  Accu-Chek FastClix Lancets MISC Check blood sugar up to 3 times a day 07/10/20   Aldine Contes, MD  albuterol (VENTOLIN HFA) 108 (90 Base) MCG/ACT inhaler Inhale 2 puffs into the lungs every 4 (four) hours as needed for wheezing or shortness of breath.    [provider]  aspirin EC 81 MG tablet Take 1 tablet (81 mg total) by mouth daily. Swallow whole. 12/17/20   Angelia Mould, MD  atorvastatin (LIPITOR) 40 MG tablet Take 1 tablet (40 mg total) by mouth daily. 12/17/20   Angelia Mould, MD  B Complex-C-Folic Acid (RENA-VITE RX) 1 MG TABS Take 1 tablet by mouth daily. 06/16/19   [provider]  bictegravir-emtricitabine-tenofovir AF (BIKTARVY) 50-200-25 MG TABS tablet TAKE 1 TABLET BY MOUTH DAILY. Patient taking differently: Take 1 tablet by mouth every evening. Take one tab by mouth daily. 10/17/20 10/17/21  Michel Bickers, MD   calcium acetate (PHOSLO) 667 MG capsule Take 2 capsules (1,334 mg total) by mouth 3 (three) times daily with meals. 10/16/18   [name], MD  clopidogrel (PLAVIX) 75 MG tablet Take 1 tablet (75 mg total) by mouth daily. 12/17/20   Angelia Mould, MD  diphenhydrAMINE (BENADRYL) 50 MG tablet Take 0.5 tablets (25 mg total) by mouth every 6 (six) hours as needed for itching (As needed for itching). 05/03/19  Welford Roche, MD  DIPHENHYDRAMINE-ACETAMINOPHEN PO Take by mouth. 06/13/20 06/12/21  [provider]  doxycycline (VIBRA-TABS) 100 MG tablet Take 1 tablet (100 mg total) by mouth 2 (two) times daily. 01/13/21   Edrick Kins, DPM  glucose blood (ACCU-CHEK SMARTVIEW) test strip Check blood sugar 4 times a day as instructed 08/14/20   Angelica Pou, MD  HEPARIN SODIUM, PORCINE, IV With hemodialysis 04/25/20 06/10/21  [provider]  insulin glargine (LANTUS) 100 UNIT/ML Solostar Pen Inject 5 Units into the skin at bedtime. Patient taking differently: Inject 17 Units into the skin at bedtime. 01/10/20   Earlene Plater, MD  iron sucrose in sodium chloride 0.9 % 100 mL Q week with dialysis 11/09/19 06/12/21  [provider]  loperamide (IMODIUM A-D) 2 MG tablet Take 2 mg by mouth 4 (four) times daily as needed for diarrhea or loose stools.    [provider]  Methoxy PEG-Epoetin Beta (MIRCERA IJ) Mircera 03/21/20 03/20/21  [provider]  mupirocin ointment (BACTROBAN) 2 % Apply 1 application topically 2 (two) times daily. 12/02/20   Edrick Kins, DPM  ondansetron (ZOFRAN ODT) 4 MG disintegrating tablet Take 1 tablet (4 mg total) by mouth every 8 (eight) hours as needed for nausea or vomiting. 01/06/20   Tedd Sias, PA  oxyCODONE-acetaminophen (PERCOCET) 10-325 MG tablet Take 1 tablet by mouth every 6 (six) hours as needed for pain. 12/10/20 12/10/21  Setzer, Edman Circle, PA-C  promethazine (PHENERGAN) 25 MG tablet Take 25 mg by mouth 3  (three) times daily as needed. 08/24/19   [provider]  sertraline (ZOLOFT) 50 MG tablet Take 1 tablet (50 mg total) by mouth daily. Patient taking differently: Take 50 mg by mouth at bedtime. 05/10/20 05/05/21  [name], MD  TUMS 500 MG chewable tablet Chew 2 tablets by mouth at bedtime. 08/25/19   [provider]  Vitamin D, Ergocalciferol, 50000 units CAPS Take by mouth. 08/29/20 08/28/21  [provider]    Allergies    Patient has no known allergies.  Review of Systems   Review of Systems  Constitutional:  Negative for activity change, chills and fever.  Respiratory:  Negative for shortness of breath.   Cardiovascular:  Negative for chest pain.  Gastrointestinal:  Negative for abdominal pain, diarrhea, nausea and vomiting.  Genitourinary:  Negative for dysuria.  Musculoskeletal:  Positive for arthralgias, gait problem and myalgias. Negative for back pain, neck pain and neck stiffness.  Skin:  Positive for color change and wound. Negative for rash.  Allergic/Immunologic: Negative for immunocompromised state.  Neurological:  Negative for dizziness, seizures, syncope, weakness, light-headedness, numbness and headaches.  Psychiatric/Behavioral:  Negative for confusion.    Physical Exam Updated Vital Signs BP (!) 146/75   Pulse 79   Temp 97.7 F (36.5 C) (Oral)   Resp 20   Ht '5\' 5"'$  (1.651 m)   Wt 77.1 kg   SpO2 99%   BMI 28.29 kg/m   Physical Exam Vitals and nursing note reviewed.  Constitutional:      General: She is not in acute distress. HENT:     Head: Normocephalic.  Eyes:     Conjunctiva/sclera: Conjunctivae normal.  Cardiovascular:     Rate and Rhythm: Normal rate and regular rhythm.     Heart sounds: No murmur heard.   No friction rub. No gallop.  Pulmonary:     Effort: Pulmonary effort is normal. No respiratory distress.  Abdominal:     General: There  is no distension.     Palpations: Abdomen is soft.  Musculoskeletal:      Cervical back: Neck supple.       Feet:     Comments: TTP in the right forefoot extending along the lateral aspect of the right foot  Skin:    General: Skin is warm.     Capillary Refill: Capillary refill takes less than 2 seconds.     Findings: No rash.     Comments: Orange discoloration noted to the skin.  Macerated skin with small amount of purulent and sanguinous discharge noted in the interdigital space between digits 4 and 5.  Discharges malodorous.  Mild swelling noted to the forefoot.  No erythema or warmth.  Skin changes concerning for dry gangrene.  And independently moves all digits of the bilateral feet.  Good strength against resistance.  Good capillary refill.  Skin is warm and well-perfused.  Peripheral pulses are intact.  Neurological:     Mental Status: She is alert.  Psychiatric:        Behavior: Behavior normal.         ED Results / Procedures / Treatments   Labs (all labs ordered are listed, but only abnormal results are displayed) Labs Reviewed  COMPREHENSIVE METABOLIC PANEL - Abnormal; Notable for the following components:      Result Value   Chloride 89 (*)    Glucose, Bld 172 (*)    BUN 32 (*)    Creatinine, Ser 5.94 (*)    Calcium 8.8 (*)    Total Protein 8.4 (*)    Albumin 2.9 (*)    AST 14 (*)    GFR, Estimated 8 (*)    Anion gap 17 (*)    All other components within normal limits  CBC WITH DIFFERENTIAL/PLATELET - Abnormal; Notable for the following components:   RBC 2.97 (*)    Hemoglobin 9.0 (*)    HCT 27.8 (*)    RDW 16.3 (*)    All other components within normal limits  PROTIME-INR - Abnormal; Notable for the following components:   Prothrombin Time 16.3 (*)    INR 1.3 (*)    All other components within normal limits  APTT - Abnormal; Notable for the following components:   aPTT 39 (*)    All other components within normal limits  RESP PANEL BY RT-PCR (FLU A&B, COVID) ARPGX2  URINE CULTURE  CULTURE, BLOOD (ROUTINE X 2)  CULTURE,  BLOOD (ROUTINE X 2)  LACTIC ACID, PLASMA  HEMOGLOBIN A1C  CBC  COMPREHENSIVE METABOLIC PANEL  LIPID PANEL  FERRITIN  I-STAT BETA HCG BLOOD, ED ([facility], [facility], [facility] ONLY)    EKG None  Radiology DG Chest 1 View  Result Date: 01/28/2021 CLINICAL DATA:  Questionable sepsis - evaluate for abnormality Patient reports right foot pain. EXAM: CHEST  1 VIEW COMPARISON:  Radiograph 06/17/2020 FINDINGS: Stable cardiomegaly. Unchanged mediastinal contours. Aortic atherosclerosis. No focal airspace disease, pulmonary edema, pleural effusion or pneumothorax. No acute osseous abnormalities are seen. IMPRESSION: Stable cardiomegaly. No acute chest findings. Electronically Signed   By: Keith Rake M.D.   On: 01/28/2021 19:57   DG Foot Complete Right  Result Date: 01/28/2021 CLINICAL DATA:  Questionable sepsis - evaluate for abnormality Right foot blistering, being followed by podiatry. EXAM: RIGHT FOOT COMPLETE - 3+ VIEW COMPARISON:  Most recent comparison radiograph 09/08/2019 FINDINGS: There is no evidence of fracture or dislocation. No erosion or bone destruction. Similar well-marginated defect about the medial aspect of the  first toe proximal phalanx. Age advanced vascular calcifications. Dorsal soft tissue edema. There is air in the soft tissues medial to the great toe distal phalanx. No radiopaque foreign body. IMPRESSION: 1. Air in the soft tissues medial to the great toe distal phalanx, may represent wound or soft tissue infection. No radiopaque foreign body or radiographic findings of osteomyelitis. 2. Dorsal soft tissue edema. Age advanced vascular calcifications. Electronically Signed   By: Keith Rake M.D.   On: 01/28/2021 19:59    Procedures Procedures   Medications Ordered in ED Medications  LORazepam (ATIVAN) injection 1 mg (has no administration in time range)  vancomycin (VANCOREADY) IVPB 1500 mg/300 mL (has no administration in time range)  aspirin EC tablet 81 mg (has no administration  in time range)  bictegravir-emtricitabine-tenofovir AF (BIKTARVY) 50-200-25 MG per tablet 1 tablet (has no administration in time range)  atorvastatin (LIPITOR) tablet 40 mg (has no administration in time range)  heparin injection 5,000 Units (has no administration in time range)  acetaminophen (TYLENOL) tablet 650 mg (has no administration in time range)    Or  acetaminophen (TYLENOL) suppository 650 mg (has no administration in time range)  senna (SENOKOT) tablet 8.6 mg (has no administration in time range)  polyethylene glycol (MIRALAX / GLYCOLAX) packet 17 g (has no administration in time range)  oxyCODONE (Oxy IR/ROXICODONE) immediate release tablet 5 mg (has no administration in time range)  calcium acetate (PHOSLO) capsule 1,334 mg (has no administration in time range)  insulin glargine (LANTUS) injection 5 Units (has no administration in time range)  insulin aspart (novoLOG) injection 0-15 Units (has no administration in time range)  oxyCODONE-acetaminophen (PERCOCET/ROXICET) 5-325 MG per tablet 1 tablet (1 tablet Oral Given 01/29/21 0152)  ceFEPIme (MAXIPIME) 2 g in sodium chloride 0.9 % 100 mL IVPB (2 g Intravenous New Bag/Given 01/29/21 0347)    ED Course  I have reviewed the triage vital signs and the nursing notes.  Pertinent labs & imaging results that were available during my care of the patient were reviewed by me and considered in my medical decision making (see chart for details).    MDM Rules/Calculators/A&P                          45 year old female with a history of ESRD on HD, HIV, diabetes mellitus type 2, PAD, AKA of LLE who presents to the emergency departments with worsening right foot pain and swelling over the last 3 weeks.  She has now having difficulty weightbearing on her right foot due to her symptoms.  No constitutional symptoms.  She has been on doxycycline for the last few weeks and has been using Betadine on the foot as recommended by podiatry with no  improvement in her symptoms.  She presents with uncontrolled pain in the right foot.  Vital signs are stable.  The patient was discussed with Dr. Betsey Holiday, attending physician.  Labs and imaging of been reviewed and independently interpreted by me.  No leukocytosis.  Labs otherwise appear at baseline with her known history of ESRD.  Lactate is normal.  X-ray with air in the soft tissues medial to on the great toe distal phalanx concerning for wound or soft tissue infection.  No findings of osteomyelitis.  However, given gradually worsening symptoms, patient will require admission.  We will cover her with 1 dose of cefepime and vancomycin.  MRI of the right foot is pending.  Consult to the internal medicine residency team  and Dr. Alfonse Spruce will accept the patient for admission.  The patient appears reasonably stabilized for admission considering the current resources, flow, and capabilities available in the ED at this time, and I doubt any other [facility] requiring further screening and/or treatment in the ED prior to admission.   Final Clinical Impression(s) / ED Diagnoses Final diagnoses:  Gangrene of right foot [facility])    Rx / DC Orders ED Discharge Orders     None        Joanne Gavel, PA-C 01/29/21 0417    Orpah Greek, MD 01/29/21 [phone]

## 2021-01-29 NOTE — ED Notes (Signed)
Attempted to call report at this time advised someone would call back 

## 2021-01-29 NOTE — ED Notes (Signed)
Pt vomiting.

## 2021-01-29 NOTE — ED Notes (Signed)
Pt unable to complete MRI d/t not being able to lay still for scan. Pt brought back to room.  Took pt to restroom. Pt able to stand and pivot to wheelchair and commode.  Pt states she is in to much pain to sit still. This RN offered her PO medicine. Pt states she cant take PO meds at this time.  IM team notified.

## 2021-01-29 NOTE — Progress Notes (Signed)
   Subjective: Patient was seen this morning at rounds.  Patient states blisters has developed over the last few weeks. She has had worsening pain in her foot. Pain meds are helping. Patient states she saw vascular in the past.  Discussed that we will talk to vascular surgeon to see if amputation is needed.  Patient states that she would like to preserve her foot. Objective: Vital signs in last 24 hours: Vitals:   01/29/21 0337 01/29/21 0400 01/29/21 0606 01/29/21 0610  BP:  (!) 146/75    Pulse: 83 79 81 81  Resp:  20    Temp:      TempSrc:      SpO2: 96% 99%    Weight:      Height:       Physical Exam Constitutional: Patient lying in hospital bed, ill-appearing.  Not in acute distress HEENT: atraumatic, No icterus, mucous membranes moist. Cardiovascular: regular rate and rhythm, normal heart sounds Pulmonary: effort normal, lungs clear to ascultation bilaterally Abdominal: flat, non distended Musculoskeletal: Right leg -gangrenous wound of fourth and fifth metatarsal .  No discharge on exam. Left AKA. Skin: warm and dry Neurological: alert, no focal deficit Psychiatric: normal mood and affect  Assessment/Plan: Principal Problem:   Gangrene of right foot (HCC) Active Problems:   Diabetes mellitus type 2 in obese (HCC)   Human immunodeficiency virus (HIV) disease ([place])   ESRD (end stage renal disease) ([place])   Anemia due to chronic kidney disease   Peripheral arterial disease (HCC)  Kathy Frank is a 45 year old female with past medical history of type 2 diabetes, ESRD on dialysis TTS, left AKA, severe PAD status post angioplasty, HIV, MDD, anemia of chronic disease, who presented to the ED for worsening right foot pain in the setting of gangrene secondary to severe PAD. Patient admitted at night with right foot pain.  Antibiotic stopped as it did not look infected.  MRI of right foot shows diffuse cellulitis and myositis but no obvious discrete subcutaneous abscess or  pyomyositis and no findings of septic arthritis or osteomyelitis.  Vascular surgery consulted.  Left message.  -Follow-up vascular surgery consult. -Continue orders per H&P -Monitor vitals -Continue n.p.o. until vascular surgery consult. -Follow-up labs -Nephrology consult for dialysis if patient stays tonight. -Continue CBGs and SSI for diabetes.  Dispo: Anticipated discharge in approximately 1-2 day(s).   Armando Reichert, MD 01/29/2021, 7:10 AM Pager: [phone] After 5pm on weekdays and 1pm on weekends: Please call On Call pager [phone]

## 2021-01-29 NOTE — Consult Note (Addendum)
Hospital Consult    Reason for Consult:  ischemic ulceration RLE Requesting Physician:  Dr. Rosita Kea, internal medicine MRN #:  [id number]  History of Present Illness: This is a 45 y.o. female who presented the ED complaining of worsening right toe pain. 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  I saw in her in follow-up on 01/08/2021 in the office after aortogram on 12/13/2020. She is s/p left AKA by Dr. Sharol Given on 12/16/2018.  She underwent aortogram with right lower extremity arteriogram and intervention on 12/13/2020 by Dr. Scot Dock.  She is being followed by podiatry on a regular basis. She saw Dr. Amalia Hailey on  01/13/2021.  12/13/2020:  Attempted angioplasty of the right peroneal artery  Angioplasty of the right anterior tibial artery (2 mm and 2.5 mm X 150) jade balloon  Laser atherectomy of the right superficial femoral artery and popliteal artery  Angioplasty of the right superficial femoral artery and popliteal artery with 5 mm x 150 mm balloon   She is taking her aspirin, statin and Plavix as prescribed.  No fever or chills.    She is diabetic and has a history of hypertension. Does not use tobacco products. End-stage renal disease on chronic intermittent hemodialysis.    Past Medical History:  Diagnosis Date   Anal abscess    chronic   Anxiety    CKD (chronic kidney disease) stage 5, GFR less than 15 ml/min (HCC) 06/19/2016   . Suspect etiologies to include diabetes and hypertension though cannot exclude HIV-associated nephropathy. Advised her against from using excessive Goody powder.   Constipation    trouble stoll coming out   Depression 06/28/2006   Qualifier: Diagnosis of  By: Riccardo Dubin MD, [place]     Diabetes mellitus type 2 in obese ([place]) 06/28/1994   DKA (diabetic ketoacidoses) 12/2014   Dyspnea    uses oxygeb 2 liters per minute at dialysis   End stage renal disease on dialysis [facility])    Erosive esophagitis    Esophageal reflux    ESRD on hemodialysis  ([place]) 05/02/2019   Eye redness    Gastroparesis    ? diabetic   GERD (gastroesophageal reflux disease)    History of blood transfusion 2019   History of hypertension    off bp meds 2020 due to low bp with dialysis   Human immunodeficiency virus (HIV) disease ([place]) [id number]   Metabolic bone disease [id number]   Moderate nonproliferative diabetic retinopathy of both eyes ([place]) 11/21/2014   11/14/14: Noted on retinal imaging; needs follow-up imaging in 6 months  05/22/16: Noted on retinal imaging again; needs follow-up imaging in 6 months   Normocytic anemia    Pruritus, unspecified 05/20/2019   Renal failure (ARF), acute on chronic ([place]) 11/24/2018   tues, thurs sat [address] hs since sept 2020   Shortness of breath 07/01/2019   when lays doan at night, musy prop head up on 2 regular and 2 small pillows   Type 2 diabetes mellitus with diabetic peripheral angiopathy without gangrene ([place]) 05/01/2019   Type II diabetes mellitus ([place]) 1994   diagnosed around 1994   Wears dentures    lower   Wound dehiscence    Wound infection s/p L transmetatarsal amputation     Past Surgical History:  Procedure Laterality Date   ABDOMINAL AORTOGRAM W/LOWER EXTREMITY N/A 12/13/2020   Procedure: ABDOMINAL AORTOGRAM W/LOWER EXTREMITY;  Surgeon: Angelia Mould, MD;  Location: [place] [facility];  Service: Cardiovascular;  Laterality:  N/A;   AMPUTATION Left 07/08/2018   Procedure: AMPUTATION FORTH RAY LEFT FOOT;  Surgeon: [name], MD;  Location: [place];  Service: Orthopedics;  Laterality: Left;   AMPUTATION Left 08/09/2018   Procedure: Left Transmetatarsal Amputation;  Surgeon: [name], MD;  Location: [place];  Service: Orthopedics;  Laterality: Left;   AMPUTATION Left 10/08/2018   Procedure: LEFT BELOW KNEE AMPUTATION;  Surgeon: [name], MD;  Location: [place];  Service: Orthopedics;  Laterality: Left;   AMPUTATION Left 10/28/2018   Procedure: REVISION BELOW KNEE AMPUTATION;  Surgeon:  [name], MD;  Location: [place];  Service: Orthopedics;  Laterality: Left;   AMPUTATION Left 12/16/2018   Procedure: LEFT ABOVE KNEE AMPUTATION;  Surgeon: [name], MD;  Location: [place];  Service: Orthopedics;  Laterality: Left;   AV FISTULA PLACEMENT Left 10/14/2018   Procedure: Arteriovenous (Av) Fistula Creation Left Arm;  Surgeon: [name], MD;  Location: [place];  Service: Vascular;  Laterality: Left;   [place] Left 04/14/2019   Procedure: BASILIC VEIN TRANSPOSITION SECOND STAGE LEFT ARM;  Surgeon: Rosetta Posner, MD;  Location: [place];  Service: Vascular;  Laterality: Left;   INCISION AND DRAINAGE ABSCESS N/A 10/24/2020   Procedure: exicision of hydradenitis;  Surgeon: Leighton Ruff, MD;  Location: [place];  Service: General;  Laterality: N/A;  45 min   INSERTION OF DIALYSIS CATHETER Right 04/14/2019   Procedure: INSERTION OF DIALYSIS CATHETER;  Surgeon: Rosetta Posner, MD;  Location: [place];  Service: Vascular;  Laterality: Right;   LOWER EXTREMITY ANGIOGRAPHY N/A 07/05/2018   Procedure: LOWER EXTREMITY ANGIOGRAPHY;  Surgeon: Serafina Mitchell, MD;  Location: [place] [facility];  Service: Cardiovascular;  Laterality: N/A;   PERIPHERAL VASCULAR ATHERECTOMY Right 12/13/2020   Procedure: PERIPHERAL VASCULAR ATHERECTOMY;  Surgeon: Angelia Mould, MD;  Location: [place] [facility];  Service: Cardiovascular;  Laterality: Right;  Superficial femoral   PERIPHERAL VASCULAR BALLOON ANGIOPLASTY Left 07/05/2018   Procedure: PERIPHERAL VASCULAR BALLOON ANGIOPLASTY;  Surgeon: Serafina Mitchell, MD;  Location: [place] [facility];  Service: Cardiovascular;  Laterality: Left;  SFA   PERIPHERAL VASCULAR BALLOON ANGIOPLASTY Right 12/13/2020   Procedure: PERIPHERAL VASCULAR BALLOON ANGIOPLASTY;  Surgeon: Angelia Mould, MD;  Location: [place] [facility];  Service: Cardiovascular;  Laterality: Right;  Peroneal artery, anterior tibial artery   RECTAL EXAM  UNDER ANESTHESIA N/A 10/24/2020   Procedure: EXAM UNDER ANESTHESIA;  Surgeon: Leighton Ruff, MD;  Location: [facility];  Service: General;  Laterality: N/A;   STUMP REVISION Left 10/19/2018   Procedure: REVISION LEFT BELOW KNEE AMPUTATION;  Surgeon: [name], MD;  Location: [place];  Service: Orthopedics;  Laterality: Left;   TUBAL LIGATION  2002    No Known Allergies  Prior to Admission medications   Medication Sig Start Date End Date Taking? Authorizing Provider  Accu-Chek FastClix Lancets MISC Check blood sugar up to 3 times a day 07/10/20   Aldine Contes, MD  albuterol (VENTOLIN HFA) 108 (90 Base) MCG/ACT inhaler Inhale 2 puffs into the lungs every 4 (four) hours as needed for wheezing or shortness of breath.    [provider]  aspirin EC 81 MG tablet Take 1 tablet (81 mg total) by mouth daily. Swallow whole. 12/17/20   Angelia Mould, MD  atorvastatin (LIPITOR) 40 MG tablet Take 1 tablet (40 mg total) by mouth daily. 12/17/20   Angelia Mould, MD  B Complex-C-Folic Acid (RENA-VITE RX) 1 MG TABS Take 1 tablet by mouth daily. 06/16/19   [provider]  bictegravir-emtricitabine-tenofovir AF (BIKTARVY) 50-200-25 MG TABS tablet TAKE 1 TABLET BY MOUTH DAILY. Patient taking differently: Take 1 tablet by mouth every evening. Take one tab by mouth daily. 10/17/20 10/17/21  Michel Bickers, MD  calcium acetate (PHOSLO) 667 MG capsule Take 2 capsules (1,334 mg total) by mouth 3 (three) times daily with meals. 10/16/18   [name], MD  clopidogrel (PLAVIX) 75 MG tablet Take 1 tablet (75 mg total) by mouth daily. 12/17/20   Angelia Mould, MD  diphenhydrAMINE (BENADRYL) 50 MG tablet Take 0.5 tablets (25 mg total) by mouth every 6 (six) hours as needed for itching (As needed for itching). 05/03/19   Welford Roche, MD  DIPHENHYDRAMINE-ACETAMINOPHEN PO Take by mouth. 06/13/20 06/12/21  [provider]  doxycycline  (VIBRA-TABS) 100 MG tablet Take 1 tablet (100 mg total) by mouth 2 (two) times daily. 01/13/21   Edrick Kins, DPM  glucose blood (ACCU-CHEK SMARTVIEW) test strip Check blood sugar 4 times a day as instructed 08/14/20   Angelica Pou, MD  HEPARIN SODIUM, PORCINE, IV With hemodialysis 04/25/20 06/10/21  [provider]  insulin glargine (LANTUS) 100 UNIT/ML Solostar Pen Inject 5 Units into the skin at bedtime. Patient taking differently: Inject 17 Units into the skin at bedtime. 01/10/20   Earlene Plater, MD  iron sucrose in sodium chloride 0.9 % 100 mL Q week with dialysis 11/09/19 06/12/21  [provider]  loperamide (IMODIUM A-D) 2 MG tablet Take 2 mg by mouth 4 (four) times daily as needed for diarrhea or loose stools.    [provider]  Methoxy PEG-Epoetin Beta (MIRCERA IJ) Mircera 03/21/20 03/20/21  [provider]  mupirocin ointment (BACTROBAN) 2 % Apply 1 application topically 2 (two) times daily. 12/02/20   Edrick Kins, DPM  ondansetron (ZOFRAN ODT) 4 MG disintegrating tablet Take 1 tablet (4 mg total) by mouth every 8 (eight) hours as needed for nausea or vomiting. 01/06/20   Tedd Sias, PA  oxyCODONE-acetaminophen (PERCOCET) 10-325 MG tablet Take 1 tablet by mouth every 6 (six) hours as needed for pain. 12/10/20 12/10/21  Setzer, Edman Circle, PA-C  promethazine (PHENERGAN) 25 MG tablet Take 25 mg by mouth 3 (three) times daily as needed. 08/24/19   [provider]  sertraline (ZOLOFT) 50 MG tablet Take 1 tablet (50 mg total) by mouth daily. Patient taking differently: Take 50 mg by mouth at bedtime. 05/10/20 05/05/21  [name], MD  TUMS 500 MG chewable tablet Chew 2 tablets by mouth at bedtime. 08/25/19   [provider]  Vitamin D, Ergocalciferol, 50000 units CAPS Take by mouth. 08/29/20 08/28/21  [provider]    Social History   Socioeconomic History   Marital status: Single    Spouse name: Not on file   Number of  children: Not on file   Years of education: 10   Highest education level: Not on file  Occupational History    Employer: UNEMPLOYED  Tobacco Use   Smoking status: Former    Packs/day: 0.10    Years: 10.00    Pack years: 1.00    Types: Cigarettes    Start date: 11/17/2013    Quit date: 09/09/2014    Years since quitting: 6.3   Smokeless tobacco: Never  Vaping Use   Vaping Use: Never used  Substance and Sexual Activity   Alcohol use: Not  Currently   Drug use: Not Currently    Types: Marijuana    Comment: marijuana last used 1 week 10-21-2020   Sexual activity: Not on file  Other Topics Concern   Not on file  Social History Narrative   Not on file   Social Determinants of Health   Financial Resource Strain: Not on file  Food Insecurity: Not on file  Transportation Needs: Not on file  Physical Activity: Not on file  Stress: Not on file  Social Connections: Not on file  Intimate Partner Violence: Not on file     Family History  Problem Relation Age of Onset   Diabetes Mother    Diabetes Brother    Diabetes Daughter    Diabetes Daughter    Mental retardation Brother        died from PNA   Diabetes Maternal Grandmother     ROS: Otherwise negative unless mentioned in HPI  Physical Examination  Vitals:   01/29/21 1043 01/29/21 1132  BP: 121/75 (!) 194/79  Pulse: 75 76  Resp: 16 20  Temp:    SpO2: 98% 100%   Body mass index is 28.29 kg/m.  General:  WDWN in NAD; vital signs documented above Gait:  not observed HENT: WNL, normocephalic Pulmonary: normal non-labored breathing  Cardiac: regular HR Skin: with multiple crusted areas of left lower legs Vascular Exam/Pulses: She has monophasic AT, PT and peroneal signals. Extremities: with ischemic changes, with skin breakdown of 4-5 webspace , without cellulitis; with open wounds; Musculoskeletal: no muscle wasting or atrophy       Neurologic: A&O X 3;  No focal weakness or paresthesias are  detected Psychiatric:  The pt has Normal affect. Groggy from Ativan           CBC    Component Value Date/Time   WBC 6.4 01/28/2021 1803   RBC 2.97 (L) 01/28/2021 1803   HGB 9.0 (L) 01/28/2021 1803   HGB 9.8 (L) 07/18/2018 1133   HCT 27.8 (L) 01/28/2021 1803   HCT 19.3 (L) 10/09/2018 1405   PLT 375 01/28/2021 1803   PLT 417 07/18/2018 1133   MCV 93.6 01/28/2021 1803   MCV 89 07/18/2018 1133   MCH 30.3 01/28/2021 1803   MCHC 32.4 01/28/2021 1803   RDW 16.3 (H) 01/28/2021 1803   RDW 12.9 07/18/2018 1133   LYMPHSABS 1.0 01/28/2021 1803   LYMPHSABS 1.6 12/23/2017 1558   MONOABS 0.4 01/28/2021 1803   EOSABS 0.1 01/28/2021 1803   EOSABS 0.2 12/23/2017 1558   BASOSABS 0.1 01/28/2021 1803   BASOSABS 0.1 12/23/2017 1558    BMET    Component Value Date/Time   NA 138 01/28/2021 1803   NA 138 12/23/2017 1558   K 3.7 01/28/2021 1803   CL 89 (L) 01/28/2021 1803   CO2 32 01/28/2021 1803   GLUCOSE 172 (H) 01/28/2021 1803   BUN 32 (H) 01/28/2021 1803   BUN 28 (H) 12/23/2017 1558   CREATININE 5.94 (H) 01/28/2021 1803   CREATININE 12.07 (H) 05/29/2020 1155   CALCIUM 8.8 (L) 01/28/2021 1803   CALCIUM 8.5 (L) 11/25/2018 0309   GFRNONAA 8 (L) 01/28/2021 1803   GFRNONAA 79 11/14/2014 1154   GFRAA 5 (L) 01/06/2020 1329   GFRAA >89 11/14/2014 1154    COAGS: Lab Results  Component Value Date   INR 1.3 (H) 01/28/2021   INR 1.1 05/03/2019   INR 1.2 02/06/2019    Imaging:   01/29/2021 MRI right foot IMPRESSION: 1. Very limited  examination due to patient motion. 2. Diffuse cellulitis and myositis but no obvious discrete subcutaneous abscess or pyomyositis. 3. No obvious findings for septic arthritis or osteomyelitis  01/08/2021 ABIs ABI Findings:  +---------+------------------+-----+----------+--------+  Right    Rt Pressure (mmHg)IndexWaveform  Comment   +---------+------------------+-----+----------+--------+  Brachial 212                                         +---------+------------------+-----+----------+--------+  ATA      146               0.69 monophasic          +---------+------------------+-----+----------+--------+  PTA      109               0.51 monophasic          +---------+------------------+-----+----------+--------+  Great Toe0                 0.00 Absent              +---------+------------------+-----+----------+--------+   +--------+------------------+-----+--------+---------+  Left    Lt Pressure (mmHg)IndexWaveformComment    +--------+------------------+-----+--------+---------+  Brachial                               HD access  +--------+------------------+-----+--------+---------+    Summary:  Right: Resting right ankle-brachial index indicates moderate right lower  extremity arterial disease. RT great toe pressure = 0 mmHg.    ASSESSMENT/PLAN: This is a 45 y.o. female diabetic female with history of peripheral arterial disease presents emergency department with worsening pain and tissue loss to her right foot.  She underwent aortogram with right lower extremity intervention including attempted angioplasty of the right peroneal artery, angioplasty of the right anterior tibial artery, laser atherectomy of the right SFA and popliteal arteries and angioplasty of the right SFA and popliteal artery with balloon on 12/13/2020.  Follow-up was arranged and aspirin, Plavix and statin were started.  Dr. Scot Dock saw no further options for revascularization.  At her follow-up, this was explained as well as concern for further tissue loss and inability to heal these wounds.  Today, she has worsening appearance of ischemic skin overlying head of the first metatarsal.  She will likely need surgical intervention as she has chronic pain and worsening appearance of wounds>>level to be determined.  She does not use her left lower extremity prosthesis.  -Dr. Stanford Breed, vascular surgeon on-call will review her recent  arteriogram, assess the patient and provide further recommendations.   Barbie Banner PA-C [facility] [phone] 01/29/2021  12:17 PM   VASCULAR STAFF ADDENDUM: I have independently interviewed and examined the patient. I agree with the above.  45YF with ESRD on HD via LUE BC AVF; HIV; DM2 (A1c 7.4); left above knee amputation. She is non-ambulatory, and does not use a left lower extremity prosthetic.  She underwent RLE angiography for limb salvage with Dr. Scot Dock in early May 2022. Severe tibial disease with no further options for revascularization at that time.  She has had expected progression of disease with ischemic tissue loss across the first, fourth and fifth toes.   I think her best option is a right below knee amputation for palliation of pain. She is not willing to undergo an amputation at this time. Please call if she changes her mind, or for any questions.  [name]. Stanford Breed, MD [facility] Phone Number: [phone] 01/29/2021 2:25 PM

## 2021-01-29 NOTE — Hospital Course (Addendum)
  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 Chaya Jan (01/31/21). Gangrenous wound of fourth and fifth metatarsal secondary to severe PAD.No evidence of infectious cause at this time. No purulent discharge of wound.  Patient was afebrile on admission with normal white count and lactic acid. Right foot x-ray did not show any bony involvement.  MRI of right foot shows diffuse cellulitis and myositis but no obvious discrete subcutaneous abscess or pyomyositis and no findings of septic arthritis or osteomyelitis. Aspirin and Plavix stopped before surgery. Vascular surgery consulted,  Recommended BKA. Pt had Rt BKA for gangrene by Dr. Chaya Jan (01/31/21). Aspirin restarted. PT recommended CIR placement. Pt's disposition got delayed because of denial from insurance, followed by Appeal *** Plavix ***  ESRD on HD TTS Pt received dialysis per schedule.  Pt's medications continued for her chronic conditions

## 2021-01-29 NOTE — Telephone Encounter (Signed)
That's unfortunate. Thanks for letting me know

## 2021-01-29 NOTE — ED Notes (Signed)
Lunch tray ordered 

## 2021-01-29 NOTE — Progress Notes (Signed)
MRI attempted on pt.  Pt continuously attempted to roll over while be positioned.  Once scan start pt would not hold still for more than 30 sec.  Please call MRI when pt is completely cooperative for a MRI scan.

## 2021-01-29 NOTE — ED Notes (Signed)
Pt removing monitoring devices. This RN explained how important it is to remain on monitors. Pt verbalized understanding.

## 2021-01-29 NOTE — ED Notes (Signed)
Pt had episode of emesis.  

## 2021-01-29 NOTE — H&P (View-Only) (Signed)
Hospital Consult    Reason for Consult:  ischemic ulceration RLE Requesting Physician:  Dr. Rosita Kea, internal medicine MRN #:  [id number]  History of Present Illness: This is a 45 y.o. female who presented the ED complaining of worsening right toe pain. 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  I saw in her in follow-up on 01/08/2021 in the office after aortogram on 12/13/2020. She is s/p left AKA by Dr. Sharol Given on 12/16/2018.  She underwent aortogram with right lower extremity arteriogram and intervention on 12/13/2020 by Dr. Scot Dock.  She is being followed by podiatry on a regular basis. She saw Dr. Amalia Hailey on  01/13/2021.  12/13/2020:  Attempted angioplasty of the right peroneal artery  Angioplasty of the right anterior tibial artery (2 mm and 2.5 mm X 150) jade balloon  Laser atherectomy of the right superficial femoral artery and popliteal artery  Angioplasty of the right superficial femoral artery and popliteal artery with 5 mm x 150 mm balloon   She is taking her aspirin, statin and Plavix as prescribed.  No fever or chills.    She is diabetic and has a history of hypertension. Does not use tobacco products. End-stage renal disease on chronic intermittent hemodialysis.    Past Medical History:  Diagnosis Date   Anal abscess    chronic   Anxiety    CKD (chronic kidney disease) stage 5, GFR less than 15 ml/min (HCC) 06/19/2016   . Suspect etiologies to include diabetes and hypertension though cannot exclude HIV-associated nephropathy. Advised her against from using excessive Goody powder.   Constipation    trouble stoll coming out   Depression 06/28/2006   Qualifier: Diagnosis of  By: Riccardo Dubin MD, [place]     Diabetes mellitus type 2 in obese ([place]) 06/28/1994   DKA (diabetic ketoacidoses) 12/2014   Dyspnea    uses oxygeb 2 liters per minute at dialysis   End stage renal disease on dialysis [facility])    Erosive esophagitis    Esophageal reflux    ESRD on hemodialysis  ([place]) 05/02/2019   Eye redness    Gastroparesis    ? diabetic   GERD (gastroesophageal reflux disease)    History of blood transfusion 2019   History of hypertension    off bp meds 2020 due to low bp with dialysis   Human immunodeficiency virus (HIV) disease ([place]) [id number]   Metabolic bone disease [id number]   Moderate nonproliferative diabetic retinopathy of both eyes ([place]) 11/21/2014   11/14/14: Noted on retinal imaging; needs follow-up imaging in 6 months  05/22/16: Noted on retinal imaging again; needs follow-up imaging in 6 months   Normocytic anemia    Pruritus, unspecified 05/20/2019   Renal failure (ARF), acute on chronic ([place]) 11/24/2018   tues, thurs sat [address] [facility] since sept 2020   Shortness of breath 07/01/2019   when lays doan at night, musy prop head up on 2 regular and 2 small pillows   Type 2 diabetes mellitus with diabetic peripheral angiopathy without gangrene ([place]) 05/01/2019   Type II diabetes mellitus ([place]) 1994   diagnosed around 1994   Wears dentures    lower   Wound dehiscence    Wound infection s/p L transmetatarsal amputation     Past Surgical History:  Procedure Laterality Date   ABDOMINAL AORTOGRAM W/LOWER EXTREMITY N/A 12/13/2020   Procedure: ABDOMINAL AORTOGRAM W/LOWER EXTREMITY;  Surgeon: Angelia Mould, MD;  Location: [place] [facility];  Service: Cardiovascular;  Laterality:  N/A;   AMPUTATION Left 07/08/2018   Procedure: AMPUTATION FORTH RAY LEFT FOOT;  Surgeon: [name], MD;  Location: [place];  Service: Orthopedics;  Laterality: Left;   AMPUTATION Left 08/09/2018   Procedure: Left Transmetatarsal Amputation;  Surgeon: [name], MD;  Location: [place];  Service: Orthopedics;  Laterality: Left;   AMPUTATION Left 10/08/2018   Procedure: LEFT BELOW KNEE AMPUTATION;  Surgeon: [name], MD;  Location: [place];  Service: Orthopedics;  Laterality: Left;   AMPUTATION Left 10/28/2018   Procedure: REVISION BELOW KNEE AMPUTATION;  Surgeon:  [name], MD;  Location: [place];  Service: Orthopedics;  Laterality: Left;   AMPUTATION Left 12/16/2018   Procedure: LEFT ABOVE KNEE AMPUTATION;  Surgeon: [name], MD;  Location: [place];  Service: Orthopedics;  Laterality: Left;   AV FISTULA PLACEMENT Left 10/14/2018   Procedure: Arteriovenous (Av) Fistula Creation Left Arm;  Surgeon: [name], MD;  Location: [place];  Service: Vascular;  Laterality: Left;   [place] Left 04/14/2019   Procedure: BASILIC VEIN TRANSPOSITION SECOND STAGE LEFT ARM;  Surgeon: Rosetta Posner, MD;  Location: [place];  Service: Vascular;  Laterality: Left;   INCISION AND DRAINAGE ABSCESS N/A 10/24/2020   Procedure: exicision of hydradenitis;  Surgeon: Leighton Ruff, MD;  Location: [place];  Service: General;  Laterality: N/A;  45 min   INSERTION OF DIALYSIS CATHETER Right 04/14/2019   Procedure: INSERTION OF DIALYSIS CATHETER;  Surgeon: Rosetta Posner, MD;  Location: [place];  Service: Vascular;  Laterality: Right;   LOWER EXTREMITY ANGIOGRAPHY N/A 07/05/2018   Procedure: LOWER EXTREMITY ANGIOGRAPHY;  Surgeon: Serafina Mitchell, MD;  Location: [place] [facility];  Service: Cardiovascular;  Laterality: N/A;   PERIPHERAL VASCULAR ATHERECTOMY Right 12/13/2020   Procedure: PERIPHERAL VASCULAR ATHERECTOMY;  Surgeon: Angelia Mould, MD;  Location: [place] [facility];  Service: Cardiovascular;  Laterality: Right;  Superficial femoral   PERIPHERAL VASCULAR BALLOON ANGIOPLASTY Left 07/05/2018   Procedure: PERIPHERAL VASCULAR BALLOON ANGIOPLASTY;  Surgeon: Serafina Mitchell, MD;  Location: [place] [facility];  Service: Cardiovascular;  Laterality: Left;  SFA   PERIPHERAL VASCULAR BALLOON ANGIOPLASTY Right 12/13/2020   Procedure: PERIPHERAL VASCULAR BALLOON ANGIOPLASTY;  Surgeon: Angelia Mould, MD;  Location: [place] [facility];  Service: Cardiovascular;  Laterality: Right;  Peroneal artery, anterior tibial artery   RECTAL EXAM  UNDER ANESTHESIA N/A 10/24/2020   Procedure: EXAM UNDER ANESTHESIA;  Surgeon: Leighton Ruff, MD;  Location: [facility];  Service: General;  Laterality: N/A;   STUMP REVISION Left 10/19/2018   Procedure: REVISION LEFT BELOW KNEE AMPUTATION;  Surgeon: [name], MD;  Location: [place];  Service: Orthopedics;  Laterality: Left;   TUBAL LIGATION  2002    No Known Allergies  Prior to Admission medications   Medication Sig Start Date End Date Taking? Authorizing Provider  Accu-Chek FastClix Lancets MISC Check blood sugar up to 3 times a day 07/10/20   Aldine Contes, MD  albuterol (VENTOLIN HFA) 108 (90 Base) MCG/ACT inhaler Inhale 2 puffs into the lungs every 4 (four) hours as needed for wheezing or shortness of breath.    [provider]  aspirin EC 81 MG tablet Take 1 tablet (81 mg total) by mouth daily. Swallow whole. 12/17/20   Angelia Mould, MD  atorvastatin (LIPITOR) 40 MG tablet Take 1 tablet (40 mg total) by mouth daily. 12/17/20   Angelia Mould, MD  B Complex-C-Folic Acid (RENA-VITE RX) 1 MG TABS Take 1 tablet by mouth daily. 06/16/19   [provider]  bictegravir-emtricitabine-tenofovir AF (BIKTARVY) 50-200-25 MG TABS tablet TAKE 1 TABLET BY MOUTH DAILY. Patient taking differently: Take 1 tablet by mouth every evening. Take one tab by mouth daily. 10/17/20 10/17/21  Michel Bickers, MD  calcium acetate (PHOSLO) 667 MG capsule Take 2 capsules (1,334 mg total) by mouth 3 (three) times daily with meals. 10/16/18   [name], MD  clopidogrel (PLAVIX) 75 MG tablet Take 1 tablet (75 mg total) by mouth daily. 12/17/20   Angelia Mould, MD  diphenhydrAMINE (BENADRYL) 50 MG tablet Take 0.5 tablets (25 mg total) by mouth every 6 (six) hours as needed for itching (As needed for itching). 05/03/19   Welford Roche, MD  DIPHENHYDRAMINE-ACETAMINOPHEN PO Take by mouth. 06/13/20 06/12/21  [provider]  doxycycline  (VIBRA-TABS) 100 MG tablet Take 1 tablet (100 mg total) by mouth 2 (two) times daily. 01/13/21   Edrick Kins, DPM  glucose blood (ACCU-CHEK SMARTVIEW) test strip Check blood sugar 4 times a day as instructed 08/14/20   Angelica Pou, MD  HEPARIN SODIUM, PORCINE, IV With hemodialysis 04/25/20 06/10/21  [provider]  insulin glargine (LANTUS) 100 UNIT/ML Solostar Pen Inject 5 Units into the skin at bedtime. Patient taking differently: Inject 17 Units into the skin at bedtime. 01/10/20   Earlene Plater, MD  iron sucrose in sodium chloride 0.9 % 100 mL Q week with dialysis 11/09/19 06/12/21  [provider]  loperamide (IMODIUM A-D) 2 MG tablet Take 2 mg by mouth 4 (four) times daily as needed for diarrhea or loose stools.    [provider]  Methoxy PEG-Epoetin Beta (MIRCERA IJ) Mircera 03/21/20 03/20/21  [provider]  mupirocin ointment (BACTROBAN) 2 % Apply 1 application topically 2 (two) times daily. 12/02/20   Edrick Kins, DPM  ondansetron (ZOFRAN ODT) 4 MG disintegrating tablet Take 1 tablet (4 mg total) by mouth every 8 (eight) hours as needed for nausea or vomiting. 01/06/20   Tedd Sias, PA  oxyCODONE-acetaminophen (PERCOCET) 10-325 MG tablet Take 1 tablet by mouth every 6 (six) hours as needed for pain. 12/10/20 12/10/21  Setzer, Ed[address] Circle, PA-C  promethazine (PHENERGAN) 25 MG tablet Take 25 mg by mouth 3 (three) times daily as needed. 08/24/19   [provider]  sertraline (ZOLOFT) 50 MG tablet Take 1 tablet (50 mg total) by mouth daily. Patient taking differently: Take 50 mg by mouth at bedtime. 05/10/20 05/05/21  [name], MD  TUMS 500 MG chewable tablet Chew 2 tablets by mouth at bedtime. 08/25/19   [provider]  Vitamin D, Ergocalciferol, 50000 units CAPS Take by mouth. 08/29/20 08/28/21  [provider]    Social History   Socioeconomic History   Marital status: Single    Spouse name: Not on file   Number of  children: Not on file   Years of education: 10   Highest education level: Not on file  Occupational History    Employer: UNEMPLOYED  Tobacco Use   Smoking status: Former    Packs/day: 0.10    Years: 10.00    Pack years: 1.00    Types: Cigarettes    Start date: 11/17/2013    Quit date: 09/09/2014    Years since quitting: 6.3   Smokeless tobacco: Never  Vaping Use   Vaping Use: Never used  Substance and Sexual Activity   Alcohol use: Not  Currently   Drug use: Not Currently    Types: Marijuana    Comment: marijuana last used 1 week 10-21-2020   Sexual activity: Not on file  Other Topics Concern   Not on file  Social History Narrative   Not on file   Social Determinants of Health   Financial Resource Strain: Not on file  Food Insecurity: Not on file  Transportation Needs: Not on file  Physical Activity: Not on file  Stress: Not on file  Social Connections: Not on file  Intimate Partner Violence: Not on file     Family History  Problem Relation Age of Onset   Diabetes Mother    Diabetes Brother    Diabetes Daughter    Diabetes Daughter    Mental retardation Brother        died from PNA   Diabetes Maternal Grandmother     ROS: Otherwise negative unless mentioned in HPI  Physical Examination  Vitals:   01/29/21 1043 01/29/21 1132  BP: 121/75 (!) 194/79  Pulse: 75 76  Resp: 16 20  Temp:    SpO2: 98% 100%   Body mass index is 28.29 kg/m.  General:  WDWN in NAD; vital signs documented above Gait:  not observed HENT: WNL, normocephalic Pulmonary: normal non-labored breathing  Cardiac: regular HR Skin: with multiple crusted areas of left lower legs Vascular Exam/Pulses: She has monophasic AT, PT and peroneal signals. Extremities: with ischemic changes, with skin breakdown of 4-5 webspace , without cellulitis; with open wounds; Musculoskeletal: no muscle wasting or atrophy       Neurologic: A&O X 3;  No focal weakness or paresthesias are  detected Psychiatric:  The pt has Normal affect. Groggy from Ativan           CBC    Component Value Date/Time   WBC 6.4 01/28/2021 1803   RBC 2.97 (L) 01/28/2021 1803   HGB 9.0 (L) 01/28/2021 1803   HGB 9.8 (L) 07/18/2018 1133   HCT 27.8 (L) 01/28/2021 1803   HCT 19.3 (L) 10/09/2018 1405   PLT 375 01/28/2021 1803   PLT 417 07/18/2018 1133   MCV 93.6 01/28/2021 1803   MCV 89 07/18/2018 1133   MCH 30.3 01/28/2021 1803   MCHC 32.4 01/28/2021 1803   RDW 16.3 (H) 01/28/2021 1803   RDW 12.9 07/18/2018 1133   LYMPHSABS 1.0 01/28/2021 1803   LYMPHSABS 1.6 12/23/2017 1558   MONOABS 0.4 01/28/2021 1803   EOSABS 0.1 01/28/2021 1803   EOSABS 0.2 12/23/2017 1558   BASOSABS 0.1 01/28/2021 1803   BASOSABS 0.1 12/23/2017 1558    BMET    Component Value Date/Time   NA 138 01/28/2021 1803   NA 138 12/23/2017 1558   K 3.7 01/28/2021 1803   CL 89 (L) 01/28/2021 1803   CO2 32 01/28/2021 1803   GLUCOSE 172 (H) 01/28/2021 1803   BUN 32 (H) 01/28/2021 1803   BUN 28 (H) 12/23/2017 1558   CREATININE 5.94 (H) 01/28/2021 1803   CREATININE 12.07 (H) 05/29/2020 1155   CALCIUM 8.8 (L) 01/28/2021 1803   CALCIUM 8.5 (L) 11/25/2018 0309   GFRNONAA 8 (L) 01/28/2021 1803   GFRNONAA 79 11/14/2014 1154   GFRAA 5 (L) 01/06/2020 1329   GFRAA >89 11/14/2014 1154    COAGS: Lab Results  Component Value Date   INR 1.3 (H) 01/28/2021   INR 1.1 05/03/2019   INR 1.2 02/06/2019    Imaging:   01/29/2021 MRI right foot IMPRESSION: 1. Very limited  examination due to patient motion. 2. Diffuse cellulitis and myositis but no obvious discrete subcutaneous abscess or pyomyositis. 3. No obvious findings for septic arthritis or osteomyelitis  01/08/2021 ABIs ABI Findings:  +---------+------------------+-----+----------+--------+  Right    Rt Pressure (mmHg)IndexWaveform  Comment   +---------+------------------+-----+----------+--------+  Brachial 212                                         +---------+------------------+-----+----------+--------+  ATA      146               0.69 monophasic          +---------+------------------+-----+----------+--------+  PTA      109               0.51 monophasic          +---------+------------------+-----+----------+--------+  Great Toe0                 0.00 Absent              +---------+------------------+-----+----------+--------+   +--------+------------------+-----+--------+---------+  Left    Lt Pressure (mmHg)IndexWaveformComment    +--------+------------------+-----+--------+---------+  Brachial                               HD access  +--------+------------------+-----+--------+---------+    Summary:  Right: Resting right ankle-brachial index indicates moderate right lower  extremity arterial disease. RT great toe pressure = 0 mmHg.    ASSESSMENT/PLAN: This is a 45 y.o. female diabetic female with history of peripheral arterial disease presents emergency department with worsening pain and tissue loss to her right foot.  She underwent aortogram with right lower extremity intervention including attempted angioplasty of the right peroneal artery, angioplasty of the right anterior tibial artery, laser atherectomy of the right SFA and popliteal arteries and angioplasty of the right SFA and popliteal artery with balloon on 12/13/2020.  Follow-up was arranged and aspirin, Plavix and statin were started.  Dr. Scot Dock saw no further options for revascularization.  At her follow-up, this was explained as well as concern for further tissue loss and inability to heal these wounds.  Today, she has worsening appearance of ischemic skin overlying head of the first metatarsal.  She will likely need surgical intervention as she has chronic pain and worsening appearance of wounds>>level to be determined.  She does not use her left lower extremity prosthesis.  -Dr. Stanford Breed, vascular surgeon on-call will review her recent  arteriogram, assess the patient and provide further recommendations.   Barbie Banner PA-C [facility] [phone] 01/29/2021  12:17 PM   VASCULAR STAFF ADDENDUM: I have independently interviewed and examined the patient. I agree with the above.  45YF with ESRD on HD via LUE BC AVF; HIV; DM2 (A1c 7.4); left above knee amputation. She is non-ambulatory, and does not use a left lower extremity prosthetic.  She underwent RLE angiography for limb salvage with Dr. Scot Dock in early May 2022. Severe tibial disease with no further options for revascularization at that time.  She has had expected progression of disease with ischemic tissue loss across the first, fourth and fifth toes.   I think her best option is a right below knee amputation for palliation of pain. She is not willing to undergo an amputation at this time. Please call if she changes her mind, or for any questions.  [name]. Stanford Breed, MD [facility] Phone Number: [phone] 01/29/2021 2:25 PM

## 2021-01-29 NOTE — H&P (Signed)
Date: 01/29/2021               Patient Name:  Kathy Frank MRN: [id number]  DOB: Feb 04, 1976 Age / Sex: 45 y.o., female   PCP: [name], MD         Medical Service: Internal Medicine Teaching Service         Attending Physician: Dr. Lucious Groves, DO    First Contact: Dr. Rosita Kea Pager: [phone]  Second Contact: Dr. Court Joy Pager: [phone]       After Hours (After 5p/  First Contact Pager: [phone]  weekends / holidays): Second Contact Pager: [phone]   Chief Complaint: Right foot gangrene   History of Present Illness:  Kathy Frank is a 45 year old female with past medical history of type 2 diabetes, ESRD on dialysis TTS, left AKA, severe PAD status post angioplasty, HIV, MDD, anemia of chronic disease, who presented to the ED for worsening right foot pain.  Patient stated that she developed a blister on her fifth metatarsal 6 weeks ago and another blister on the fourth metatarsal shortly after.  States that the wound did not seem to heal. She states that her pain has progressively worsened, especially this morning that was brought her to the ED.  Patient denies fever.  States she has chronic chills.  Denies any drainage from the wound.  Denies abdominal pain, nausea, vomiting, diarrhea, dysuria.  Said that she had trouble bear weight on the right foot.  Her mobility is limited at baseline.  She has been seeing podiatry for her wound and was started on 10 days of doxycycline which she finished 2 days ago.  She is also seeing vascular surgery for severe PAD of her right leg.  On 12/13/2020, she underwent selective catheterization of the right external iliac artery and angioplasty of the right peroneal artery/anterior tibial artery/superficial femoral artery and laser arthrectomy of right superficial femoral artery/popliteal artery.  Patient gets HD on Tuesday, Thursdays, Saturday.  Kathy Frank that she went to HD yesterday but missed dialysis on Saturday.  Reports taking all her medication as  instructed and never misses a dose of her antiplatelets and statin.  In the ED, patient was afebrile.  CBC showed normal white count with hemoglobin of 9.  CMP showed creatinine of 5.94.  Lactic acid of 1.4.  X-ray of right foot showed soft tissue infection without any bony involvement.  Meds:  No outpatient medications have been marked as taking for the 01/28/21 encounter [facility]).     Allergies: Allergies as of 01/28/2021   (No Known Allergies)   Past Medical History:  Diagnosis Date   Anal abscess    chronic   Anxiety    CKD (chronic kidney disease) stage 5, GFR less than 15 ml/min (HCC) 06/19/2016   . Suspect etiologies to include diabetes and hypertension though cannot exclude HIV-associated nephropathy. Advised her against from using excessive Goody powder.   Constipation    trouble stoll coming out   Depression 06/28/2006   Qualifier: Diagnosis of  By: Riccardo Dubin MD, [place]     Diabetes mellitus type 2 in obese ([place]) 06/28/1994   DKA (diabetic ketoacidoses) 12/2014   Dyspnea    uses oxygeb 2 liters per minute at dialysis   End stage renal disease on dialysis [facility])    Erosive esophagitis    Esophageal reflux    ESRD on hemodialysis ([place]) 05/02/2019   Eye redness    Gastroparesis    ? diabetic   GERD (  gastroesophageal reflux disease)    History of blood transfusion 2019   History of hypertension    off bp meds 2020 due to low bp with dialysis   Human immunodeficiency virus (HIV) disease ([place]) [id number]   Metabolic bone disease [id number]   Moderate nonproliferative diabetic retinopathy of both eyes ([place]) 11/21/2014   11/14/14: Noted on retinal imaging; needs follow-up imaging in 6 months  05/22/16: Noted on retinal imaging again; needs follow-up imaging in 6 months   Normocytic anemia    Pruritus, unspecified 05/20/2019   Renal failure (ARF), acute on chronic ([place]) 11/24/2018   tues, thurs sat [address] hs since sept 2020   Shortness of breath 07/01/2019    when lays doan at night, musy prop head up on 2 regular and 2 small pillows   Type 2 diabetes mellitus with diabetic peripheral angiopathy without gangrene ([place]) 05/01/2019   Type II diabetes mellitus ([place]) 1994   diagnosed around 1994   Wears dentures    lower   Wound dehiscence    Wound infection s/p L transmetatarsal amputation     Family History:  Family History  Problem Relation Age of Onset   Diabetes Mother    Diabetes Brother    Diabetes Daughter    Diabetes Daughter    Mental retardation Brother        died from PNA   Diabetes Maternal Grandmother      Social History:  Lives at home with daughter.  Requires a lot of assistance for ADLs. Denies alcohol or smoking Smoke marijuana 2-5 times a week.  Denies other IV drug use.  Review of Systems: A complete ROS was negative except as per HPI.   Physical Exam: Blood pressure (!) 155/75, pulse 81, temperature 97.7 F (36.5 C), temperature source Oral, resp. rate 20, height '5\' 5"'$  (1.651 m), weight 77.1 kg, SpO2 99 %. Physical Exam Constitutional:      General: She is not in acute distress.    Appearance: She is obese. She is ill-appearing.  HENT:     Head: Normocephalic.     Mouth/Throat:     Mouth: Mucous membranes are moist.     Comments: Missing front teeth Eyes:     General: No scleral icterus.       Right eye: No discharge.        Left eye: No discharge.     Conjunctiva/sclera: Conjunctivae normal.  Cardiovascular:     Rate and Rhythm: Normal rate and regular rhythm.     Heart sounds: Normal heart sounds. No murmur heard.    Comments: No LE edema Pulmonary:     Effort: Pulmonary effort is normal. No respiratory distress.     Breath sounds: Normal breath sounds. No wheezing.  Abdominal:     General: Bowel sounds are normal. There is no distension.     Palpations: Abdomen is soft.     Tenderness: There is no abdominal tenderness.  Musculoskeletal:        General: Normal range of motion.     Cervical  back: Normal range of motion.     Comments: Left AKA.  Stump appear well-healed. See picture for right foot. Gangrenous wound of fourth and fifth metatarsal.  No purulent discharge.  Dry flaking skin of right foot.  Very diminished pedis pulse.  Still have sensation in the dorsal surface.  Very diminished sensation in the plantar surface.  Skin:    General: Skin is warm.     Coloration: Skin is  not jaundiced.     Comments: Scattered, healed, hyperpigmented wounds noted on right lower leg.  Neurological:     Mental Status: She is alert and oriented to person, place, and time. Mental status is at baseline.  Psychiatric:        Mood and Affect: Mood normal.        Thought Content: Thought content normal.        Judgment: Judgment normal.         EKG: personally reviewed my interpretation is sinus rhythm, left atrial enlargement  CXR: personally reviewed my interpretation is unremarkable  Right foot x-ray 1. Air in the soft tissues medial to the great toe distal phalanx, may represent wound or soft tissue infection. No radiopaque foreign body or radiographic findings of osteomyelitis. 2. Dorsal soft tissue edema. Age advanced vascular calcifications.  Assessment & Plan by Problem: Principal Problem:   Gangrene of right foot (HCC) Active Problems:   Diabetes mellitus type 2 in obese (HCC)   Human immunodeficiency virus (HIV) disease ([place])   ESRD (end stage renal disease) ([place])   Anemia due to chronic kidney disease   Peripheral arterial disease (HCC)  Kathy Frank is a 45 year old female with past medical history of type 2 diabetes, ESRD on dialysis TTS, left AKA, severe PAD status post angioplasty, HIV, MDD, anemia of chronic disease, who presented to the ED for worsening right foot pain in the setting of gangrene secondary to severe PAD.  Gangrene of right foot Severe PAD Gangrenous wound of fourth and fifth metatarsal secondary to severe PAD.  This is a chronic issue.  No  evidence of infectious cause at this time.  Patient was afebrile on admission with normal white count and lactic acid.  Right foot x-ray did not show any bony involvement.  No purulent discharge of wound.   Patient is following with vascular surgeon and underwent extensive revascularization on 5/9.  Last ABI on 6/1 still showed moderate PAD.  There is no further option for revascularization per vascular surgery.  If MRI is negative for osteomyelitis, will consult vascular surgery.  Patient likely will need an amputation.  -Pending MRI.  If negative for osteomyelitis, will discontinue antibiotic. -Consult vascular surgery in the morning -Continue aspirin and Lipitor.  Hold Plavix in case patient requires surgery -N.p.o. -Recheck lipid panel  ESRD on HD TTS Patient with HD yesterday.  CMP do not show any severe electrolyte abnormality. -Will consult nephrology in a.m. to set up dialysis if patient remains inpatient -Continue calcium acetate  Anemia of chronic disease Hemoglobin on admission of 9, baseline around 10.  Will check ferritin to evaluate for iron storage.  Likely need EPO with HD. -CBC in a.m.  HIV Reports compliant with Biktarvy.  Last viral load in 05/2020 was undetectable.  CD4 count was also appropriate. -Continue Biktarvy  Type 2 diabetes Last A1c 8.51-year ago.  Patient reports taking 17 units of Lantus daily. -Repeat A1c -Start 5 units of Lantus daily and sliding scale  Major depressive disorder -Continue sertraline  Full code DVT: Heparin Diet: N.p.o. IV fluid: none  Dispo: Admit patient to Inpatient with expected length of stay greater than 2 midnights.  Signed: Gaylan Gerold, DO 01/29/2021, 4:06 AM  Pager: [phone] After 5pm on weekdays and 1pm on weekends: On Call pager: [phone]

## 2021-01-29 NOTE — ED Notes (Signed)
This RN explained the importance of remaining still to complete the MRI. Pt verbalized understanding and stated she will try. Pain med given.

## 2021-01-29 NOTE — ED Notes (Signed)
Gave pt dose of PO meds with meal tray. Pt unable to tolerate and is vomiting

## 2021-01-30 ENCOUNTER — Encounter (HOSPITAL_COMMUNITY): Payer: Self-pay | Admitting: Internal Medicine

## 2021-01-30 ENCOUNTER — Ambulatory Visit: Payer: Medicare Other

## 2021-01-30 ENCOUNTER — Other Ambulatory Visit: Payer: Self-pay

## 2021-01-30 DIAGNOSIS — I96 Gangrene, not elsewhere classified: Secondary | ICD-10-CM

## 2021-01-30 DIAGNOSIS — F4322 Adjustment disorder with anxiety: Secondary | ICD-10-CM

## 2021-01-30 LAB — CBC
HCT: 28 % — ABNORMAL LOW (ref 36.0–46.0)
Hemoglobin: 9.1 g/dL — ABNORMAL LOW (ref 12.0–15.0)
MCH: 30.5 pg (ref 26.0–34.0)
MCHC: 32.5 g/dL (ref 30.0–36.0)
MCV: 94 fL (ref 80.0–100.0)
Platelets: 349 10*3/uL (ref 150–400)
RBC: 2.98 MIL/uL — ABNORMAL LOW (ref 3.87–5.11)
RDW: 16.6 % — ABNORMAL HIGH (ref 11.5–15.5)
WBC: 7.2 10*3/uL (ref 4.0–10.5)
nRBC: 0 % (ref 0.0–0.2)

## 2021-01-30 LAB — BASIC METABOLIC PANEL
Anion gap: 20 — ABNORMAL HIGH (ref 5–15)
BUN: 49 mg/dL — ABNORMAL HIGH (ref 6–20)
CO2: 27 mmol/L (ref 22–32)
Calcium: 8.8 mg/dL — ABNORMAL LOW (ref 8.9–10.3)
Chloride: 89 mmol/L — ABNORMAL LOW (ref 98–111)
Creatinine, Ser: 8.7 mg/dL — ABNORMAL HIGH (ref 0.44–1.00)
GFR, Estimated: 5 mL/min — ABNORMAL LOW (ref >60)
Glucose, Bld: 166 mg/dL — ABNORMAL HIGH (ref 70–99)
Potassium: 4.4 mmol/L (ref 3.5–5.1)
Sodium: 136 mmol/L (ref 135–145)

## 2021-01-30 LAB — GLUCOSE, CAPILLARY
Glucose-Capillary: 107 mg/dL — ABNORMAL HIGH (ref 70–99)
Glucose-Capillary: 224 mg/dL — ABNORMAL HIGH (ref 70–99)
Glucose-Capillary: 103 mg/dL — ABNORMAL HIGH (ref 70–99)
Glucose-Capillary: 141 mg/dL — ABNORMAL HIGH (ref 70–99)

## 2021-01-30 MED ORDER — SODIUM CHLORIDE 0.9 % IV SOLN: 100.0000 mL | INTRAVENOUS | Status: DC | PRN

## 2021-01-30 MED ORDER — LIDOCAINE-PRILOCAINE 2.5-2.5 % EX CREA
1.0000 "application " | TOPICAL_CREAM | CUTANEOUS | Status: DC | PRN
  Filled 2021-01-30: qty 5

## 2021-01-30 MED ORDER — CALCITRIOL 0.25 MCG PO CAPS: 1.2500 ug | ORAL_CAPSULE | Freq: Every day | ORAL | 0 refills | Status: DC

## 2021-01-30 MED ORDER — RENA-VITE PO TABS
1.0000 | ORAL_TABLET | Freq: Every day | ORAL | Status: DC
  Administered 2021-01-30 – 2021-02-07 (×8): 1 via ORAL
  Filled 2021-01-30 (×9): qty 1

## 2021-01-30 MED ORDER — HEPARIN SODIUM (PORCINE) 1000 UNIT/ML DIALYSIS
1000.0000 [IU] | INTRAMUSCULAR | Status: DC | PRN
  Filled 2021-01-30: qty 1

## 2021-01-30 MED ORDER — DARBEPOETIN ALFA 100 MCG/0.5ML IJ SOSY
100.0000 ug | PREFILLED_SYRINGE | INTRAMUSCULAR | Status: DC
  Filled 2021-01-30: qty 0.5

## 2021-01-30 MED ORDER — LIDOCAINE HCL (PF) 1 % IJ SOLN: 5.0000 mL | INTRAMUSCULAR | Status: DC | PRN

## 2021-01-30 MED ORDER — ALTEPLASE 2 MG IJ SOLR: 2.0000 mg | Freq: Once | INTRAMUSCULAR | Status: DC | PRN

## 2021-01-30 MED ORDER — NEPRO/CARBSTEADY PO LIQD
237.0000 mL | Freq: Two times a day (BID) | ORAL | Status: DC
  Administered 2021-01-31 – 2021-02-08 (×14): 237 mL via ORAL

## 2021-01-30 MED ORDER — SERTRALINE HCL 50 MG PO TABS: 50.0000 mg | ORAL_TABLET | Freq: Every day | ORAL | 0 refills | Status: DC

## 2021-01-30 MED ORDER — CALCITRIOL 0.5 MCG PO CAPS
1.2500 ug | ORAL_CAPSULE | Freq: Every day | ORAL | Status: DC
  Administered 2021-01-30 – 2021-02-01 (×2): 1.25 ug via ORAL
  Filled 2021-01-30 (×4): qty 1

## 2021-01-30 MED ORDER — PENTAFLUOROPROP-TETRAFLUOROETH EX AERO: 1.0000 "application " | INHALATION_SPRAY | CUTANEOUS | Status: DC | PRN

## 2021-01-30 NOTE — Progress Notes (Addendum)
Plan for R. BKA tomorrow with Dr.Brabham. NPO at midnight.

## 2021-01-30 NOTE — Consult Note (Addendum)
[facility] Renal Consultation Note  Indication for Consultation:  Management of ESRD/hemodialysis; anemia, hypertension/volume and secondary hyperparathyroidism  HPI: Kathy Frank is a 45 y.o. female with  ESRD, hd since  04/25/19 with ho . DM, HTN. S/P L BKA, DKA, HIV gastroparesis, diabetic retinopathy,  erosive esophagitis, AOCD, SHPT. Former smoker.PAD sp R foot revascularization on 12/13/20.  Noted followed by [facility] and podiatry.  She is compliant with dialysis last dialysis was on schedule 01/28/21 she has been close to her dry weight recently.  Now admitted with increasing pain from right distal forefoot with dry gangrene.  Noted over the past few months has worsening wounds of the first and fifth toes and increasing pain in area .Marland Kitchen  She is followed by Dr. Amalia Hailey with podiatry and recently reportedly completed 10-day course of Doxy.  MRI admission showed evidence of cellulitis mild cytosis no evidence of osteomyelitis.  Currently seen on hemodialysis stating initially she was hesitant about amputation and now agrees to have amputation done. . And she does not want to be discharged until surgery done.  Noted infectious disease saw patient today and said no need for antibiotic.  I did not confirm this with the patient-  she told me earlier that she refused amputation.  Is getting HD today on schedule          Past Medical History:  Diagnosis Date   Anal abscess    chronic   Anxiety    CKD (chronic kidney disease) stage 5, GFR less than 15 ml/min (HCC) 06/19/2016   . Suspect etiologies to include diabetes and hypertension though cannot exclude HIV-associated nephropathy. Advised her against from using excessive Goody powder.   Constipation    trouble stoll coming out   Depression 06/28/2006   Qualifier: Diagnosis of  By: Riccardo Dubin MD, Todd     Diabetes mellitus type 2 in obese ([place]) 06/28/1994   DKA (diabetic ketoacidoses) 12/2014   Dyspnea    uses oxygeb 2 liters per  minute at dialysis   End stage renal disease on dialysis [facility])    Erosive esophagitis    Esophageal reflux    ESRD on hemodialysis ([place]) 05/02/2019   Eye redness    Gastroparesis    ? diabetic   GERD (gastroesophageal reflux disease)    History of blood transfusion 2019   History of hypertension    off bp meds 2020 due to low bp with dialysis   Human immunodeficiency virus (HIV) disease ([place]) [id number]   Metabolic bone disease [id number]   Moderate nonproliferative diabetic retinopathy of both eyes ([place]) 11/21/2014   11/14/14: Noted on retinal imaging; needs follow-up imaging in 6 months  05/22/16: Noted on retinal imaging again; needs follow-up imaging in 6 months   Normocytic anemia    Pruritus, unspecified 05/20/2019   Renal failure (ARF), acute on chronic ([place]) 11/24/2018   tues, thurs sat [address] hs since sept 2020   Shortness of breath 07/01/2019   when lays doan at night, musy prop head up on 2 regular and 2 small pillows   Type 2 diabetes mellitus with diabetic peripheral angiopathy without gangrene ([place]) 05/01/2019   Type II diabetes mellitus ([place]) 1994   diagnosed around 1994   Wears dentures    lower   Wound dehiscence    Wound infection s/p L transmetatarsal amputation     Past Surgical History:  Procedure Laterality Date   ABDOMINAL AORTOGRAM W/LOWER EXTREMITY N/A 12/13/2020   Procedure: ABDOMINAL AORTOGRAM W/LOWER EXTREMITY;  Surgeon: Angelia Mould, MD;  Location: [place] [facility];  Service: Cardiovascular;  Laterality: N/A;   AMPUTATION Left 07/08/2018   Procedure: AMPUTATION FORTH RAY LEFT FOOT;  Surgeon: [name], MD;  Location: [place];  Service: Orthopedics;  Laterality: Left;   AMPUTATION Left 08/09/2018   Procedure: Left Transmetatarsal Amputation;  Surgeon: [name], MD;  Location: [place];  Service: Orthopedics;  Laterality: Left;   AMPUTATION Left 10/08/2018   Procedure: LEFT BELOW KNEE AMPUTATION;  Surgeon: [name], MD;  Location:  [place];  Service: Orthopedics;  Laterality: Left;   AMPUTATION Left 10/28/2018   Procedure: REVISION BELOW KNEE AMPUTATION;  Surgeon: [name], MD;  Location: [place];  Service: Orthopedics;  Laterality: Left;   AMPUTATION Left 12/16/2018   Procedure: LEFT ABOVE KNEE AMPUTATION;  Surgeon: [name], MD;  Location: [place];  Service: Orthopedics;  Laterality: Left;   AV FISTULA PLACEMENT Left 10/14/2018   Procedure: Arteriovenous (Av) Fistula Creation Left Arm;  Surgeon: [name], MD;  Location: [place];  Service: Vascular;  Laterality: Left;   [place] Left 04/14/2019   Procedure: BASILIC VEIN TRANSPOSITION SECOND STAGE LEFT ARM;  Surgeon: [place]etta Posner, MD;  Location: [place];  Service: Vascular;  Laterality: Left;   INCISION AND DRAINAGE ABSCESS N/A 10/24/2020   Procedure: exicision of hydradenitis;  Surgeon: Leighton Ruff, MD;  Location: [place];  Service: General;  Laterality: N/A;  45 min   INSERTION OF DIALYSIS CATHETER Right 04/14/2019   Procedure: INSERTION OF DIALYSIS CATHETER;  Surgeon: [place]etta Posner, MD;  Location: [place];  Service: Vascular;  Laterality: Right;   LOWER EXTREMITY ANGIOGRAPHY N/A 07/05/2018   Procedure: LOWER EXTREMITY ANGIOGRAPHY;  Surgeon: Serafina Mitchell, MD;  Location: [place] [facility];  Service: Cardiovascular;  Laterality: N/A;   PERIPHERAL VASCULAR ATHERECTOMY Right 12/13/2020   Procedure: PERIPHERAL VASCULAR ATHERECTOMY;  Surgeon: Angelia Mould, MD;  Location: [place] [facility];  Service: Cardiovascular;  Laterality: Right;  Superficial femoral   PERIPHERAL VASCULAR BALLOON ANGIOPLASTY Left 07/05/2018   Procedure: PERIPHERAL VASCULAR BALLOON ANGIOPLASTY;  Surgeon: Serafina Mitchell, MD;  Location: [place] [facility];  Service: Cardiovascular;  Laterality: Left;  SFA   PERIPHERAL VASCULAR BALLOON ANGIOPLASTY Right 12/13/2020   Procedure: PERIPHERAL VASCULAR BALLOON ANGIOPLASTY;  Surgeon: Angelia Mould,  MD;  Location: [place] [facility];  Service: Cardiovascular;  Laterality: Right;  Peroneal artery, anterior tibial artery   RECTAL EXAM UNDER ANESTHESIA N/A 10/24/2020   Procedure: EXAM UNDER ANESTHESIA;  Surgeon: Leighton Ruff, MD;  Location: [facility];  Service: General;  Laterality: N/A;   STUMP REVISION Left 10/19/2018   Procedure: REVISION LEFT BELOW KNEE AMPUTATION;  Surgeon: [name], MD;  Location: [place];  Service: Orthopedics;  Laterality: Left;   TUBAL LIGATION  2002      Family History  Problem Relation Age of Onset   Diabetes Mother    Diabetes Brother    Diabetes Daughter    Diabetes Daughter    Mental retardation Brother        died from PNA   Diabetes Maternal Grandmother       reports that she quit smoking about 6 years ago. Her smoking use included cigarettes. She started smoking about 7 years ago. She has a 1.00 pack-year smoking history. She has never used smokeless tobacco. She reports previous alcohol use. She reports previous drug use. Drug: Marijuana.  No Known Allergies  Prior to Admission medications   Medication Sig Start Date End Date Taking? Authorizing Provider  albuterol (VENTOLIN HFA) 108 (90 Base) MCG/ACT inhaler Inhale 2 puffs into the lungs every 4 (four) hours as needed for wheezing or shortness of breath.   Yes [provider]  aspirin EC 81 MG tablet Take 1 tablet (81 mg total) by mouth daily. Swallow whole. 12/17/20  Yes Angelia Mould, MD  B Complex-C-Folic Acid (RENA-VITE RX) 1 MG TABS Take 1 tablet by mouth daily. 06/16/19  Yes [provider]  bictegravir-emtricitabine-tenofovir AF (BIKTARVY) 50-200-25 MG TABS tablet TAKE 1 TABLET BY MOUTH DAILY. Patient taking differently: Take 1 tablet by mouth every evening. Take one tab by mouth daily. 10/17/20 10/17/21 Yes Michel Bickers, MD  calcium acetate (PHOSLO) 667 MG capsule Take 2 capsules (1,334 mg total) by mouth 3 (three) times daily with meals. 10/16/18   Yes [name], MD  clopidogrel (PLAVIX) 75 MG tablet Take 1 tablet (75 mg total) by mouth daily. 12/17/20  Yes Angelia Mould, MD  diphenhydrAMINE (BENADRYL) 50 MG tablet Take 0.5 tablets (25 mg total) by mouth every 6 (six) hours as needed for itching (As needed for itching). 05/03/19  Yes Santos-Sanchez, Merlene Morse, MD  insulin glargine (LANTUS) 100 UNIT/ML Solostar Pen Inject 5 Units into the skin at bedtime. Patient taking differently: Inject 17 Units into the skin at bedtime. 01/10/20  Yes Earlene Plater, MD  loperamide (IMODIUM A-D) 2 MG tablet Take 2 mg by mouth 4 (four) times daily as needed for diarrhea or loose stools.   Yes [provider]  oxyCODONE-acetaminophen (PERCOCET) 10-325 MG tablet Take 1 tablet by mouth every 6 (six) hours as needed for pain. 12/10/20 12/10/21 Yes Setzer, Edman Circle, PA-C  TUMS 500 MG chewable tablet Chew 2 tablets by mouth at bedtime. 08/25/19  Yes [provider]  Accu-Chek FastClix Lancets MISC Check blood sugar up to 3 times a day 07/10/20   Aldine Contes, MD  atorvastatin (LIPITOR) 40 MG tablet Take 1 tablet (40 mg total) by mouth daily. Patient not taking: Reported on 01/29/2021 12/17/20   Angelia Mould, MD  doxycycline (VIBRA-TABS) 100 MG tablet Take 1 tablet (100 mg total) by mouth 2 (two) times daily. Patient not taking: Reported on 01/29/2021 01/13/21   Edrick Kins, DPM  glucose blood (ACCU-CHEK SMARTVIEW) test strip Check blood sugar 4 times a day as instructed 08/14/20   Angelica Pou, MD  HEPARIN SODIUM, PORCINE, IV With hemodialysis 04/25/20 06/10/21  [provider]  iron sucrose in sodium chloride 0.9 % 100 mL Q week with dialysis 11/09/19 06/12/21  [provider]  Methoxy PEG-Epoetin Beta (MIRCERA IJ) Mircera 03/21/20 03/20/21  [provider]  mupirocin ointment (BACTROBAN) 2 % Apply 1 application topically 2 (two) times daily. Patient not taking: Reported on 01/29/2021 12/02/20    Edrick Kins, DPM  ondansetron (ZOFRAN ODT) 4 MG disintegrating tablet Take 1 tablet (4 mg total) by mouth every 8 (eight) hours as needed for nausea or vomiting. Patient not taking: Reported on 01/29/2021 01/06/20   Tedd Sias, PA  promethazine (PHENERGAN) 25 MG tablet Take 25 mg by mouth 3 (three) times daily as needed. Patient not taking: Reported on 01/29/2021 08/24/19   [provider]  sertraline (ZOLOFT) 50 MG tablet Take 1 tablet (50 mg total) by mouth daily. Patient not taking: Reported on 01/29/2021 05/10/20 05/05/21  [name], MD  Vitamin D, Ergocalciferol, 50000 units CAPS  Take by mouth. Patient not taking: Reported on 01/29/2021 08/29/20 08/28/21  [provider]    I have reviewed the patient's current medications. Prior to Admission:  Medications Prior to Admission  Medication Sig Dispense Refill Last Dose   albuterol (VENTOLIN HFA) 108 (90 Base) MCG/ACT inhaler Inhale 2 puffs into the lungs every 4 (four) hours as needed for wheezing or shortness of breath.   01/28/2021   aspirin EC 81 MG tablet Take 1 tablet (81 mg total) by mouth daily. Swallow whole. 90 tablet 3 Past Week   B Complex-C-Folic Acid (RENA-VITE RX) 1 MG TABS Take 1 tablet by mouth daily.   01/28/2021   bictegravir-emtricitabine-tenofovir AF (BIKTARVY) 50-200-25 MG TABS tablet TAKE 1 TABLET BY MOUTH DAILY. (Patient taking differently: Take 1 tablet by mouth every evening. Take one tab by mouth daily.) 30 tablet 3 01/28/2021   calcium acetate (PHOSLO) 667 MG capsule Take 2 capsules (1,334 mg total) by mouth 3 (three) times daily with meals. 180 capsule 2 Past Week   clopidogrel (PLAVIX) 75 MG tablet Take 1 tablet (75 mg total) by mouth daily. 90 tablet 3 Past Week   diphenhydrAMINE (BENADRYL) 50 MG tablet Take 0.5 tablets (25 mg total) by mouth every 6 (six) hours as needed for itching (As needed for itching). 30 tablet 0 Past Month   insulin glargine (LANTUS) 100 UNIT/ML Solostar Pen Inject 5  Units into the skin at bedtime. (Patient taking differently: Inject 17 Units into the skin at bedtime.) 3 mL 0 Past Week   loperamide (IMODIUM A-D) 2 MG tablet Take 2 mg by mouth 4 (four) times daily as needed for diarrhea or loose stools.   01/28/2021   oxyCODONE-acetaminophen (PERCOCET) 10-325 MG tablet Take 1 tablet by mouth every 6 (six) hours as needed for pain. 15 tablet 0 01/29/2021   TUMS 500 MG chewable tablet Chew 2 tablets by mouth at bedtime.   Past Week   Accu-Chek FastClix Lancets MISC Check blood sugar up to 3 times a day 306 each 3    atorvastatin (LIPITOR) 40 MG tablet Take 1 tablet (40 mg total) by mouth daily. (Patient not taking: Reported on 01/29/2021) 90 tablet 3 Not Taking   doxycycline (VIBRA-TABS) 100 MG tablet Take 1 tablet (100 mg total) by mouth 2 (two) times daily. (Patient not taking: Reported on 01/29/2021) 20 tablet 1 Completed Course   glucose blood (ACCU-CHEK SMARTVIEW) test strip Check blood sugar 4 times a day as instructed 125 each 12    HEPARIN SODIUM, PORCINE, IV With hemodialysis      iron sucrose in sodium chloride 0.9 % 100 mL Q week with dialysis      Methoxy PEG-Epoetin Beta (MIRCERA IJ) Mircera      mupirocin ointment (BACTROBAN) 2 % Apply 1 application topically 2 (two) times daily. (Patient not taking: Reported on 01/29/2021) 30 g 1 Not Taking   ondansetron (ZOFRAN ODT) 4 MG disintegrating tablet Take 1 tablet (4 mg total) by mouth every 8 (eight) hours as needed for nausea or vomiting. (Patient not taking: Reported on 01/29/2021) 20 tablet 0 Not Taking   promethazine (PHENERGAN) 25 MG tablet Take 25 mg by mouth 3 (three) times daily as needed. (Patient not taking: Reported on 01/29/2021)   Not Taking   sertraline (ZOLOFT) 50 MG tablet Take 1 tablet (50 mg total) by mouth daily. (Patient not taking: Reported on 01/29/2021) 90 tablet 3 Not Taking   Vitamin D, Ergocalciferol, 50000 units CAPS Take by mouth. (Patient not  taking: Reported on 01/29/2021)   Not Taking    Scheduled:  aspirin EC  81 mg Oral Daily   atorvastatin  40 mg Oral Daily   bictegravir-emtricitabine-tenofovir AF  1 tablet Oral Daily   calcium acetate  1,334 mg Oral TID WC   Chlorhexidine Gluconate Cloth  6 each Topical Daily   heparin  5,000 Units Subcutaneous Q8H   insulin aspart  0-15 Units Subcutaneous TID WC   insulin glargine  5 Units Subcutaneous QHS   senna  1 tablet Oral BID   sertraline  50 mg Oral QHS   Continuous:  sodium chloride     sodium chloride     [id number] chloride, sodium chloride, acetaminophen **OR** acetaminophen, alteplase, heparin, HYDROmorphone (DILAUDID) injection, lidocaine (PF), lidocaine-prilocaine, ondansetron (ZOFRAN) IV, oxyCODONE, pentafluoroprop-tetrafluoroeth, polyethylene glycol Anti-infectives (From admission, onward)    Start     Dose/Rate Route Frequency Ordered Stop   01/30/21 1815  bictegravir-emtricitabine-tenofovir AF (BIKTARVY) 50-200-25 MG per tablet 1 tablet        1 tablet Oral Daily 01/29/21 1753     01/30/21 1800  ceFEPIme (MAXIPIME) 2 g in sodium chloride 0.9 % 100 mL IVPB  Status:  Discontinued        2 g 200 mL/hr over 30 Minutes Intravenous Every T-Th-Sa (1800) 01/29/21 0314 01/29/21 0346   01/30/21 1200  vancomycin (VANCOCIN) IVPB 750 mg/150 ml premix  Status:  Discontinued        750 mg 150 mL/hr over 60 Minutes Intravenous Every T-Th-Sa (Hemodialysis) 01/29/21 0314 01/29/21 0346   01/29/21 1000  bictegravir-emtricitabine-tenofovir AF (BIKTARVY) 50-200-25 MG per tablet 1 tablet  Status:  Discontinued        1 tablet Oral Daily 01/29/21 0346 01/29/21 1753   01/29/21 0300  vancomycin (VANCOREADY) IVPB 1500 mg/300 mL        1,500 mg 150 mL/hr over 120 Minutes Intravenous  Once 01/29/21 0246 01/29/21 0748   01/29/21 0300  ceFEPIme (MAXIPIME) 2 g in sodium chloride 0.9 % 100 mL IVPB        2 g 200 mL/hr over 30 Minutes Intravenous  Once 01/29/21 0246 01/29/21 0439       Results for orders placed or performed during  the hospital encounter of 01/28/21 (from the past 48 hour(s))  Lactic acid, plasma     Status: None   Collection Time: 01/28/21  6:03 PM  Result Value Ref Range   Lactic Acid, Venous 1.4 0.5 - 1.9 mmol/L    Comment: Performed at [facility], [address], [place], [place] [address]  Comprehensive metabolic panel     Status: Abnormal   Collection Time: 01/28/21  6:03 PM  Result Value Ref Range   Sodium 138 135 - 145 mmol/L   Potassium 3.7 3.5 - 5.1 mmol/L   Chloride 89 (L) 98 - 111 mmol/L   CO2 32 22 - 32 mmol/L   Glucose, Bld 172 (H) 70 - 99 mg/dL    Comment: Glucose reference range applies only to samples taken after fasting for at least 8 hours.   BUN 32 (H) 6 - 20 mg/dL   Creatinine, Ser 5.94 (H) 0.44 - 1.00 mg/dL   Calcium 8.8 (L) 8.9 - 10.3 mg/dL   Total Protein 8.4 (H) 6.5 - 8.1 g/dL   Albumin 2.9 (L) 3.5 - 5.0 g/dL   AST 14 (L) 15 - 41 U/L   ALT 9 0 - 44 U/L   Alkaline Phosphatase 54 38 - 126 U/L  Total Bilirubin 0.7 0.3 - 1.2 mg/dL   GFR, Estimated 8 (L) >60 mL/min    Comment: (NOTE) Calculated using the CKD-EPI Creatinine Equation (2021)    Anion gap 17 (H) 5 - 15    Comment: Performed at [facility] [address] [place], [place] [address]  CBC WITH DIFFERENTIAL     Status: Abnormal   Collection Time: 01/28/21  6:03 PM  Result Value Ref Range   WBC 6.4 4.0 - 10.5 K/uL   RBC 2.97 (L) 3.87 - 5.11 MIL/uL   Hemoglobin 9.0 (L) 12.0 - 15.0 g/dL   HCT 27.8 (L) 36.0 - 46.0 %   MCV 93.6 80.0 - 100.0 fL   MCH 30.3 26.0 - 34.0 pg   MCHC 32.4 30.0 - 36.0 g/dL   RDW 16.3 (H) 11.5 - 15.5 %   Platelets 375 150 - 400 K/uL   nRBC 0.0 0.0 - 0.2 %   Neutrophils Relative % 74 %   Neutro Abs 4.8 1.7 - 7.7 K/uL   Lymphocytes Relative 15 %   Lymphs Abs 1.0 0.7 - 4.0 K/uL   Monocytes Relative 7 %   Monocytes Absolute 0.4 0.1 - 1.0 K/uL   Eosinophils Relative 2 %   Eosinophils Absolute 0.1 0.0 - 0.5 K/uL   Basophils Relative 1 %   Basophils Absolute 0.1 0.0 -  0.1 K/uL   Immature Granulocytes 1 %   Abs Immature Granulocytes 0.03 0.00 - 0.07 K/uL    Comment: Performed at [facility] [address], [place], [place] [address]  Protime-INR     Status: Abnormal   Collection Time: 01/28/21  6:03 PM  Result Value Ref Range   Prothrombin Time 16.3 (H) 11.4 - 15.2 seconds   INR 1.3 (H) 0.8 - 1.2    Comment: (NOTE) INR goal varies based on device and disease states. Performed at [facility], [place] [address], [place], [place] [address]   APTT     Status: Abnormal   Collection Time: 01/28/21  6:03 PM  Result Value Ref Range   aPTT 39 (H) 24 - 36 seconds    Comment:        IF BASELINE aPTT IS ELEVATED, SUGGEST PATIENT RISK ASSESSMENT BE USED TO DETERMINE APPROPRIATE ANTICOAGULANT THERAPY. Performed at [facility], [place] [address], [place], [place] [address]   Blood Culture (routine x 2)     Status: None (Preliminary result)   Collection Time: 01/28/21  6:12 PM   Specimen: BLOOD  Result Value Ref Range   Specimen Description BLOOD RIGHT ANTECUBITAL    Special Requests      BOTTLES DRAWN AEROBIC AND ANAEROBIC Blood Culture adequate volume   Culture      NO GROWTH 2 DAYS Performed at [facility], [place] [address], [place], [place] [address]    Report Status PENDING   I-Stat beta hCG blood, ED     Status: None   Collection Time: 01/28/21  6:20 PM  Result Value Ref Range   I-stat hCG, quantitative <5.0 <5 mIU/mL   Comment 3            Comment:   GEST. AGE      CONC.  (mIU/mL)   <=1 WEEK        5 - 50     2 WEEKS       50 - 500     3 WEEKS  100 - 10,000     4 WEEKS     1,000 - 30,000        FEMALE AND NON-PREGNANT FEMALE:     LESS THAN 5 mIU/mL   Resp Panel by RT-PCR (Flu A&B, Covid) Nasopharyngeal Swab     Status: None   Collection Time: 01/29/21  2:42 AM   Specimen: Nasopharyngeal Swab; Nasopharyngeal(NP) swabs in vial transport medium  Result Value Ref Range   SARS Coronavirus 2 by RT PCR NEGATIVE  NEGATIVE    Comment: (NOTE) SARS-CoV-2 target nucleic acids are NOT DETECTED.  The SARS-CoV-2 RNA is generally detectable in upper respiratory specimens during the acute phase of infection. The lowest concentration of SARS-CoV-2 viral copies this assay can detect is 138 copies/mL. A negative result does not preclude SARS-Cov-2 infection and should not be used as the sole basis for treatment or other patient management decisions. A negative result may occur with  improper specimen collection/handling, submission of specimen other than nasopharyngeal swab, presence of viral mutation(s) within the areas targeted by this assay, and inadequate number of viral copies(<138 copies/mL). A negative result must be combined with clinical observations, patient history, and epidemiological information. The expected result is Negative.  Fact Sheet for Patients:  EntrepreneurPulse.com.au  Fact Sheet for Healthcare Providers:  IncredibleEmployment.be  This test is no t yet approved or cleared by the [place] FDA and  has been authorized for detection and/or diagnosis of SARS-CoV-2 by FDA under an Emergency Use Authorization (EUA). This EUA will remain  in effect (meaning this test can be used) for the duration of the COVID-19 declaration under Section 564(b)(1) of the Act, 21 U.S.C.section 360bbb-3(b)(1), unless the authorization is terminated  or revoked sooner.       Influenza A by PCR NEGATIVE NEGATIVE   Influenza B by PCR NEGATIVE NEGATIVE    Comment: (NOTE) The Xpert Xpress SARS-CoV-2/FLU/RSV plus assay is intended as an aid in the diagnosis of influenza from Nasopharyngeal swab specimens and should not be used as a sole basis for treatment. Nasal washings and aspirates are unacceptable for Xpert Xpress SARS-CoV-2/FLU/RSV testing.  Fact Sheet for Patients: EntrepreneurPulse.com.au  Fact Sheet for Healthcare  Providers: IncredibleEmployment.be  This test is not yet approved or cleared by the [place] FDA and has been authorized for detection and/or diagnosis of SARS-CoV-2 by FDA under an Emergency Use Authorization (EUA). This EUA will remain in effect (meaning this test can be used) for the duration of the COVID-19 declaration under Section 564(b)(1) of the Act, 21 U.S.C. section 360bbb-3(b)(1), unless the authorization is terminated or revoked.  Performed at [facility], [place] [address], [place], [place] [address]   CBG monitoring, ED     Status: Abnormal   Collection Time: 01/29/21  9:46 AM  Result Value Ref Range   Glucose-Capillary 135 (H) 70 - 99 mg/dL    Comment: Glucose reference range applies only to samples taken after fasting for at least 8 hours.  CBG monitoring, ED     Status: Abnormal   Collection Time: 01/29/21  3:22 PM  Result Value Ref Range   Glucose-Capillary 110 (H) 70 - 99 mg/dL    Comment: Glucose reference range applies only to samples taken after fasting for at least 8 hours.  Hemoglobin A1c     Status: Abnormal   Collection Time: 01/29/21  4:07 PM  Result Value Ref Range   Hgb A1c MFr Bld 6.7 (H) 4.8 - 5.6 %    Comment: (NOTE)  Pre diabetes:          5.7%-6.4%  Diabetes:              >6.4%  Glycemic control for   <7.0% adults with diabetes    Mean Plasma Glucose 145.59 mg/dL    Comment: Performed at [facility] [address] [place], [place] [address]  CBC     Status: Abnormal   Collection Time: 01/29/21  4:07 PM  Result Value Ref Range   WBC 6.4 4.0 - 10.5 K/uL   RBC 3.09 (L) 3.87 - 5.11 MIL/uL   Hemoglobin 9.5 (L) 12.0 - 15.0 g/dL   HCT 29.8 (L) 36.0 - 46.0 %   MCV 96.4 80.0 - 100.0 fL   MCH 30.7 26.0 - 34.0 pg   MCHC 31.9 30.0 - 36.0 g/dL   RDW 16.7 (H) 11.5 - 15.5 %   Platelets 349 150 - 400 K/uL   nRBC 0.0 0.0 - 0.2 %    Comment: Performed at [facility] [address], [place], [place] [address]   Comprehensive metabolic panel     Status: Abnormal   Collection Time: 01/29/21  4:07 PM  Result Value Ref Range   Sodium 137 135 - 145 mmol/L   Potassium 4.1 3.5 - 5.1 mmol/L   Chloride 90 (L) 98 - 111 mmol/L   CO2 27 22 - 32 mmol/L   Glucose, Bld 139 (H) 70 - 99 mg/dL    Comment: Glucose reference range applies only to samples taken after fasting for at least 8 hours.   BUN 46 (H) 6 - 20 mg/dL   Creatinine, Ser 8.06 (H) 0.44 - 1.00 mg/dL   Calcium 8.8 (L) 8.9 - 10.3 mg/dL   Total Protein 7.7 6.5 - 8.1 g/dL   Albumin 2.8 (L) 3.5 - 5.0 g/dL   AST 11 (L) 15 - 41 U/L   ALT 9 0 - 44 U/L   Alkaline Phosphatase 54 38 - 126 U/L   Total Bilirubin 0.8 0.3 - 1.2 mg/dL   GFR, Estimated 6 (L) >60 mL/min    Comment: (NOTE) Calculated using the CKD-EPI Creatinine Equation (2021)    Anion gap 20 (H) 5 - 15    Comment: Performed at [facility], [place] [address] [place], [place] [address]  Lipid panel     Status: Abnormal   Collection Time: 01/29/21  4:07 PM  Result Value Ref Range   Cholesterol 155 0 - 200 mg/dL   Triglycerides 98 <150 mg/dL   HDL 37 (L) >40 mg/dL   Total CHOL/HDL Ratio 4.2 RATIO   VLDL 20 0 - 40 mg/dL   LDL Cholesterol 98 0 - 99 mg/dL    Comment:        Total Cholesterol/HDL:CHD Risk Coronary Heart Disease Risk Table                     Men   Women  1/2 Average Risk   3.4   3.3  Average Risk       5.0   4.4  2 X Average Risk   9.6   7.1  3 X Average Risk  23.4   11.0        Use the calculated Patient Ratio above and the CHD Risk Table to determine the patient's CHD Risk.        ATP III CLASSIFICATION (LDL):  <100     mg/dL   Optimal  100-129  mg/dL   Near or Above                    Optimal  130-159  mg/dL   Borderline  160-189  mg/dL   High  >190     mg/dL   Very High Performed at [facility] [address], [place], [place] [address]   Ferritin     Status: Abnormal   Collection Time: 01/29/21  4:07 PM  Result Value Ref Range   Ferritin 421  (H) 11 - 307 ng/mL    Comment: Performed at [facility], [place] [address] [place], [place] [address]  CBG monitoring, ED     Status: Abnormal   Collection Time: 01/29/21  5:42 PM  Result Value Ref Range   Glucose-Capillary 143 (H) 70 - 99 mg/dL    Comment: Glucose reference range applies only to samples taken after fasting for at least 8 hours.  Basic metabolic panel     Status: Abnormal   Collection Time: 01/30/21  1:05 AM  Result Value Ref Range   Sodium 136 135 - 145 mmol/L   Potassium 4.4 3.5 - 5.1 mmol/L   Chloride 89 (L) 98 - 111 mmol/L   CO2 27 22 - 32 mmol/L   Glucose, Bld 166 (H) 70 - 99 mg/dL    Comment: Glucose reference range applies only to samples taken after fasting for at least 8 hours.   BUN 49 (H) 6 - 20 mg/dL   Creatinine, Ser 8.70 (H) 0.44 - 1.00 mg/dL   Calcium 8.8 (L) 8.9 - 10.3 mg/dL   GFR, Estimated 5 (L) >60 mL/min    Comment: (NOTE) Calculated using the CKD-EPI Creatinine Equation (2021)    Anion gap 20 (H) 5 - 15    Comment: Performed at [facility] [address], [place], [place] [address]  CBC     Status: Abnormal   Collection Time: 01/30/21  1:05 AM  Result Value Ref Range   WBC 7.2 4.0 - 10.5 K/uL   RBC 2.98 (L) 3.87 - 5.11 MIL/uL   Hemoglobin 9.1 (L) 12.0 - 15.0 g/dL   HCT 28.0 (L) 36.0 - 46.0 %   MCV 94.0 80.0 - 100.0 fL   MCH 30.5 26.0 - 34.0 pg   MCHC 32.5 30.0 - 36.0 g/dL   RDW 16.6 (H) 11.5 - 15.5 %   Platelets 349 150 - 400 K/uL   nRBC 0.0 0.0 - 0.2 %    Comment: Performed at [facility], [place] [address] [place], [place] [address]  Glucose, capillary     Status: Abnormal   Collection Time: 01/30/21  7:54 AM  Result Value Ref Range   Glucose-Capillary 107 (H) 70 - 99 mg/dL    Comment: Glucose reference range applies only to samples taken after fasting for at least 8 hours.  Glucose, capillary     Status: Abnormal   Collection Time: 01/30/21 12:08 PM  Result Value Ref Range   Glucose-Capillary 224 (H) 70 - 99  mg/dL    Comment: Glucose reference range applies only to samples taken after fasting for at least 8 hours.    ROS: See HPI   Physical Exam: Vitals:   01/30/21 1400 01/30/21 1415  BP: (!) 179/90   Pulse: 76   Resp: 20 20  Temp:    SpO2:       General: Alert chronically ill female on dialysis NAD HEENT [place], EOMI, nonicteric,  PERRLA Neck: No JVD Heart: RRR, no MRG Lungs CTA nonlabored breathing Abdomen: Bowel sounds normoactive, soft nontender nondistended no ascites Extremities: Left AKA stump healed, right foot with dry gangrene wound fourth and fifth metatarsal no purulent discharge dry flaking skin right forefoot (noted picture of right foot) Skin multiple tattoos and right foot as above Neuro: Alert O x3 moves all extremities independently no acute focal deficits deficits appreciated Dialysis Access: Patent on dialysis left upper arm AV fistula  Dialysis Orders: Center: [facility] on TTS. EDW 75 kg HD Bath 2K 2 CA time 4-hour Heparin 3000 units. Access  lua avf   Calcitriol 1.25 mcg p.o. q. dialysis  Mircera 100 MCG every 2 weeks last given 01/23/2021 HD  Venofer 100 mg x 4 more doses for loading dose Other OP transferrin sat low 23% was to be given 5 doses of Venofer  Assessment/Plan Right foot gangrene= patient agrees to have amputation, plan per admitting staff, VVS and ID ESRD -HD TTS, on HD via AVF now tolerating Hypertension/volume = mild hypertension currently attempting 3.4 L UF on dialysis tolerating Anemia of ESRD-Hgb 9.1 outpatient Mircera given 6/21, will hold Venofer with current gangrene. Metabolic bone disease -continue p.o. vitamin D on dialysis calcium acetate as binder, follow-up calcium phosphorus trend Nutrition -renal carb modified diet albumin today protein supplement with Nepro HIV-meds per admit Diabetes mellitus type 2-per admit team Depressive disorder on sertraline per admission team  Ernest Haber, PA-C [place]  [phone] 01/30/2021, 2:48 PM   Patient seen and examined, agree with above note with above modifications. ESRD patient with also many other medical issues including PAD-  s/p left AKA and s/p revasc attempts to RLE but admitted with more pain and wound to right foot-  initially was refusing amputation but now we hear she is agreeable-  HD today on schedule and appropriate titrations of dialysis related medications  Corliss Parish, MD 01/30/2021

## 2021-01-30 NOTE — Progress Notes (Addendum)
   Subjective: Patient was seen this morning.  Patient would like to forego surgery at this time and plan to discharge after hemodialysis today.  Addendum: Paged by RN and patient has decided she would like to stay for surgery.  We will reach out to Dr. Roselie Awkward.     Objective: Vital signs in last 24 hours: Vitals:   01/30/21 1359 01/30/21 1400 01/30/21 1415 01/30/21 1500  BP: (!) 179/90 (!) 171/81 (!) 168/80 (!) 151/72  Pulse: 76 76 78 80  Resp:  20 20   Temp:      TempSrc:      SpO2:      Weight:      Height:       Physical Exam Constitutional: Patient lying in hospital bed, Not in acute distress HEENT: atraumatic, No icterus, mucous membranes moist. Cardiovascular: Regular rate and rhythm, normal heart sounds. Pulmonary: effort normal, lungs clear to ascultation bilaterally Abdominal: flat, non distended Musculoskeletal: Right leg-gangrene this wound, fourth and fifth metatarsal.  No discharge on exam.  Left AKA. Skin: Warm and dry. Neurological: Alert and oriented, no focal deficits. Psychiatric: Anxious mood and affect  Assessment/Plan: Principal Problem:   Gangrene of right foot (HCC) Active Problems:   Diabetes mellitus type 2 in obese (HCC)   Human immunodeficiency virus (HIV) disease ([place])   ESRD (end stage renal disease) ([place])   Anemia due to chronic kidney disease   Peripheral arterial disease (HCC)  Kathy Frank is a 45 year old female with past medical history of type 2 diabetes, ESRD on dialysis TTS, left AKA, severe PAD status post angioplasty, HIV, MDD, anemia of chronic disease, who presented to the ED for worsening right foot pain in the setting of gangrene secondary to severe PAD.  #Gangrene of right foot - Patient has decided to stay for right below-knee amputation for palliation of pain.  Will reconsult Dr. Stanford Breed. -Appreciate ID consulting, no need for antibiotics.  #ESRD, Tuesdays, Thursdays, Saturday -HD today, per nephrology  #Type 2  diabetes -Continue CBGs, Lantus 5 units ,SSI for diabetes.  Dispo: Discharge pending surgery.   Kathy Rob, MD 01/30/2021, 4:16 PM Pager: [phone] After 5pm on weekdays and 1pm on weekends: Please call On Call pager [phone]

## 2021-01-30 NOTE — Discharge Instructions (Addendum)
Dear Kathy Frank,   Thank you for letting us participate in your care! In this section, you will find a brief hospital admission summary of why you were admitted to the hospital, what happened during your admission, your diagnosis/diagnoses, and recommended follow up.  You were admitted because you were experiencing pain in Rt foot  Your testing revealed dry gangrene You were diagnosed with dry gangrene. You were treated with pain medications.   You were also seen by vascular surgery. They recommended below knee amputation. You were also seen by Infectious Disease. They don't recommended antibiotics and following up with vascular surgeon. Your condition remain stable and you were discharged from the hospital for meeting this goal.     POST-HOSPITAL & CARE INSTRUCTIONS Follow-up with vascular surgery. Follow up with Dr Megan Salon at 11 am on 02/05/21. Follow up with Podiatry for wound care.  Follow up with Nephrology for Dialysis needs.  Please let PCP/Specialists know of any changes in medications that were made.  Please see medications section of this packet for any medication changes.   DOCTOR'S APPOINTMENTS & FOLLOW UP Future Appointments  Date Time Provider [place]  02/05/2021 11:00 AM Michel Bickers, MD [facility]  02/12/2021 11:15 AM Edrick Kins, DPM [facility]  05/09/2021  2:40 PM Raulkar, Clide Deutscher, MD [facility]     Thank you for choosing [facility]! Take care and be well!  Internal [facility]  [address] [place], [place] [address] [phone]

## 2021-01-30 NOTE — Discharge Summary (Deleted)
Name: Kathy Frank MRN: [id number] DOB: [birth year] 45 y.o. PCP: [name], MD  Date of Admission: 01/28/2021  5:16 PM Date of Discharge:  01/30/21 Attending Physician: Lucious Groves, DO   Discharge Diagnosis: Gangrene of right foot ([place]) Type 2 DM HIV ESRD on HD Anemia of Chronic Disease PAD  Discharge Medications: Allergies as of 01/30/2021   No Known Allergies      Medication List     STOP taking these medications    doxycycline 100 MG tablet Commonly known as: VIBRA-TABS   mupirocin ointment 2 % Commonly known as: BACTROBAN   ondansetron 4 MG disintegrating tablet Commonly known as: Zofran ODT   promethazine 25 MG tablet Commonly known as: PHENERGAN   Vitamin D (Ergocalciferol) 50000 units Caps       TAKE these medications    Accu-Chek FastClix Lancets Misc Check blood sugar up to 3 times a day   Accu-Chek SmartView test strip Generic drug: glucose blood Check blood sugar 4 times a day as instructed   albuterol 108 (90 Base) MCG/ACT inhaler Commonly known as: VENTOLIN HFA Inhale 2 puffs into the lungs every 4 (four) hours as needed for wheezing or shortness of breath.   aspirin EC 81 MG tablet Take 1 tablet (81 mg total) by mouth daily. Swallow whole.   atorvastatin 40 MG tablet Commonly known as: Lipitor Take 1 tablet (40 mg total) by mouth daily.   Biktarvy 50-200-25 MG Tabs tablet Generic drug: bictegravir-emtricitabine-tenofovir AF TAKE 1 TABLET BY MOUTH DAILY. What changed:  when to take this additional instructions   calcitRIOL 0.25 MCG capsule Commonly known as: ROCALTROL Take 5 capsules (1.25 mcg total) by mouth daily.   calcium acetate 667 MG capsule Commonly known as: PHOSLO Take 2 capsules (1,334 mg total) by mouth 3 (three) times daily with meals.   clopidogrel 75 MG tablet Commonly known as: PLAVIX Take 1 tablet (75 mg total) by mouth daily.   diphenhydrAMINE 50 MG tablet Commonly known as: BENADRYL Take 0.5  tablets (25 mg total) by mouth every 6 (six) hours as needed for itching (As needed for itching).   HEPARIN SODIUM (PORCINE) IV With hemodialysis   insulin glargine 100 UNIT/ML Solostar Pen Commonly known as: LANTUS Inject 5 Units into the skin at bedtime. What changed: how much to take   iron sucrose in sodium chloride 0.9 % 100 mL Q week with dialysis   loperamide 2 MG tablet Commonly known as: IMODIUM A-D Take 2 mg by mouth 4 (four) times daily as needed for diarrhea or loose stools.   MIRCERA IJ Mircera   oxyCODONE-acetaminophen 10-325 MG tablet Commonly known as: Percocet Take 1 tablet by mouth every 6 (six) hours as needed for pain.   Rena-Vite Rx 1 MG Tabs Take 1 tablet by mouth daily.   sertraline 50 MG tablet Commonly known as: Zoloft Take 1 tablet (50 mg total) by mouth at bedtime. What changed: when to take this   Tums 500 MG chewable tablet Generic drug: calcium carbonate Chew 2 tablets by mouth at bedtime.               Discharge Care Instructions  (From admission, onward)           Start     Ordered   01/30/21 0000  No dressing needed        01/30/21 1542            Disposition and follow-up:   [name] was  discharged from [facility] in Stable condition.  At the hospital follow up visit please address:  1.  Follow up: Follow-up with vascular surgery for BKA. Pt stated she is not mentally ready for BKA at this time. Will call when ready.  Follow up with Dr Megan Salon [facility] at11 am on 02/05/21. Follow up with Podiatry for wound care. Follow up with Nephrology for Dialysis needs.   2.  Labs / imaging needed at time of follow-up: None  3.  Pending labs/ test needing follow-up: none  Follow-up Appointments:  Follow-up Information     [facility]. Schedule an appointment as soon as possible for a visit in 1 week(s).   Why: Make appointment in 1 week. Contact information: [address] [place] [id number]                     Future Appointments  Date Time Provider [place]  02/05/2021 11:00 AM Michel Bickers, MD [facility]  02/12/2021 11:15 AM Edrick Kins, DPM [facility]  05/09/2021  2:40 PM Raulkar, Clide Deutscher, MD [facility] CPR    Hospital Course by problem list: Gangrene of right foot Severe PAD Gangrenous wound of fourth and fifth metatarsal secondary to severe PAD.No evidence of infectious cause at this time. No purulent discharge of wound.  Patient was afebrile on admission with normal white count and lactic acid.  Right foot x-ray did not show any bony involvement.  Patient following with vascular surgeon and underwent extensive revascularization on 5/9.  Last ABI on 6/1 still showed moderate PAD. MRI of right foot shows diffuse cellulitis and myositis but no obvious discrete subcutaneous abscess or pyomyositis and no findings of septic arthritis or osteomyelitis.  Vascular surgery consulted, recommended BKA but Pt refused at this time. Pt not mentally prepared. Will call when ready. ID consulted and didn't recommend antibiotics at this time, recommended to Follow up with  her podiatry and potentially vascular surgery and F/u with Dr Megan Salon in [place] (she has upcoming apptment next week).   ESRD on HD TTS Pt received dialysis per schedule.  Pt's medications continued for her chronic conditions. Subjective on day of discharge: Pt states she feels ok. Denies any new symptoms. States she knows that she needs amputation but she is not mentally ready for amputation as she already had amputation in other leg.  Discharge Exam:   BP (!) 151/72 (BP Location: Right Arm)   Pulse 80   Temp 99 F (37.2 C) (Oral)   Resp 20   Ht '5\' 5"'$  (1.651 m)   Wt 77.9 kg   SpO2 100%   BMI 28.58 kg/m  Discharge exam: Constitutional: Patient lying in hospital bed, Not in acute distress HEENT: atraumatic, No icterus, mucous membranes  moist. Cardiovascular: Regular rate and rhythm, normal heart sounds. Pulmonary: effort normal, lungs clear to ascultation bilaterally Abdominal: flat, non distended Musculoskeletal: Right leg-gangrene this wound, fourth and fifth metatarsal.  No discharge on exam.  Left AKA. Skin: Warm and dry. Neurological: Alert and oriented, no focal deficits. Psychiatric: Anxious mood and affect  Pertinent Labs, Studies, and Procedures:  DG Chest 1 View  Result Date: 01/28/2021 CLINICAL DATA:  Questionable sepsis - evaluate for abnormality Patient reports right foot pain. EXAM: CHEST  1 VIEW COMPARISON:  Radiograph 06/17/2020 FINDINGS: Stable cardiomegaly. Unchanged mediastinal contours. Aortic atherosclerosis. No focal airspace disease, pulmonary edema, pleural effusion or pneumothorax. No acute osseous abnormalities are seen. IMPRESSION: Stable cardiomegaly.  No acute chest findings. Electronically Signed   By: Keith Rake M.D.   On: 01/28/2021 19:57   MR FOOT RIGHT WO CONTRAST  Result Date: 01/29/2021 CLINICAL DATA:  Diabetic on dialysis with foot pain and swelling. EXAM: MRI OF THE RIGHT FOREFOOT WITHOUT CONTRAST TECHNIQUE: Multiplanar, multisequence MR imaging of the right foot was performed. No intravenous contrast was administered. COMPARISON:  Radiographs 01/28/2021 FINDINGS: Examination is quite limited due to patient motion. There is subcutaneous soft tissue swelling/edema suggesting cellulitis. There is also diffuse myositis involving the forefoot musculature. No obvious discrete fluid collections to suggest subcutaneous abscess or pyomyositis. Grossly the bony structures are intact. I do not see any obvious findings for septic arthritis or osteomyelitis but the exam is quite limited. IMPRESSION: 1. Very limited examination due to patient motion. 2. Diffuse cellulitis and myositis but no obvious discrete subcutaneous abscess or pyomyositis. 3. No obvious findings for septic arthritis or  osteomyelitis. Electronically Signed   By: Marijo Sanes M.D.   On: 01/29/2021 08:41   DG Foot Complete Right  Result Date: 01/28/2021 CLINICAL DATA:  Questionable sepsis - evaluate for abnormality Right foot blistering, being followed by podiatry. EXAM: RIGHT FOOT COMPLETE - 3+ VIEW COMPARISON:  Most recent comparison radiograph 09/08/2019 FINDINGS: There is no evidence of fracture or dislocation. No erosion or bone destruction. Similar well-marginated defect about the medial aspect of the first toe proximal phalanx. Age advanced vascular calcifications. Dorsal soft tissue edema. There is air in the soft tissues medial to the great toe distal phalanx. No radiopaque foreign body. IMPRESSION: 1. Air in the soft tissues medial to the great toe distal phalanx, may represent wound or soft tissue infection. No radiopaque foreign body or radiographic findings of osteomyelitis. 2. Dorsal soft tissue edema. Age advanced vascular calcifications. Electronically Signed   By: Keith Rake M.D.   On: 01/28/2021 19:59    Discharge Instructions: Discharge Instructions     Call MD for:  extreme fatigue   Complete by: As directed    Call MD for:  severe uncontrolled pain   Complete by: As directed    Call MD for:  temperature >100.4   Complete by: As directed    Diet - low sodium heart healthy   Complete by: As directed    Increase activity slowly   Complete by: As directed    No dressing needed   Complete by: As directed      Dear [name],   Thank you for letting us participate in your care! In this section, you will find a brief hospital admission summary of why you were admitted to the hospital, what happened during your admission, your diagnosis/diagnoses, and recommended follow up. You were admitted because you were experiencing pain in Rt foot  Your testing revealed dry gangrene You were diagnosed with dry gangrene. You were treated with pain medications.   You were also seen by  vascular surgery. They recommended below knee amputation. You were also seen by Infectious Disease. They don't recommended antibiotics and following up with vascular surgeon. Your condition remain stable and you were discharged from the hospital for meeting this goal.     POST-HOSPITAL & CARE INSTRUCTIONS Follow-up with vascular surgery.  Follow up with Dr Megan Salon at 11 am on 02/05/21. Follow up with Podiatry for wound care. Follow up with Nephrology for Dialysis needs.  Please let PCP/Specialists know of any changes in medications that were made. Please see medications section of this packet for any medication  changes.   DOCTOR'S APPOINTMENTS & FOLLOW UP       Future Appointments  Date Time Provider [place]  02/05/2021 11:00 AM Michel Bickers, MD [facility]  02/12/2021 11:15 AM Edrick Kins, DPM [facility]  05/09/2021  2:40 PM Raulkar, Clide Deutscher, MD [facility] CPR        Thank you for choosing [facility]! Take care and be well!   Internal Medicine Teaching Service Inpatient Team [facility] [place], [place] [address] [phone]  Signed: Armando Reichert, MD 01/30/2021, 3:48 PM   Pager: [phone]

## 2021-01-30 NOTE — Progress Notes (Unsigned)
Called patient- no voice mail set up   Hughes Better, LCSW

## 2021-01-30 NOTE — Procedures (Signed)
Patient was seen on dialysis and the procedure was supervised.  BFR 350  Via AVF BP is  167/85.   Patient appears to be tolerating treatment well  Louis Meckel 01/30/2021

## 2021-01-30 NOTE — Plan of Care (Signed)
Patient arrived from ED; alert, oriented and without distress. Settled into room, admission assessment completed. Will continue to monitor according to orders/plan of care.

## 2021-01-30 NOTE — Progress Notes (Signed)
Notified Internal med resident to read Neph note that pt had decided to have surgery done, they will confirm with pt.

## 2021-01-30 NOTE — Consult Note (Signed)
[facility]    Date of Admission:  01/28/2021     Reason for Consult: gangrene diabetic foot    Referring Provider: Heber Monroe   Lines:  Peripheral iv  Abx: none        Assessment: 45 yo dm female, esrd on iHD via left UE fistula, s/p left bka, admitted for pain due to dry gangrene of the right distal forefoot  At this time, there is no evidence active pyogenic complication of the dry gangrene. She is at risk for it however, and would serve well to follow with vascular surgery/podiatry outpatient  She is well controlled on hiv with excellent immune reconstitution. I do not worry about presence of an OI process either at this time   Plan: No need for abx Please have patient f/u with her podiatry and potentially vascular surgery.  Continue wound care F/u dr Megan Salon in [place] (she has upcoming apptment next week)       ------------------------------------------------ Principal Problem:   Gangrene of right foot ([place]) Active Problems:   Diabetes mellitus type 2 in obese ([place])   Human immunodeficiency virus (HIV) disease ([place])   ESRD (end stage renal disease) ([place])   Anemia due to chronic kidney disease   Peripheral arterial disease (H[facility])    HPI: Kathy Frank is a 45 y.o. female well controlled hiv on biktarvy, dm2, esrd on iHD the past year with left avf, pvd s/p left bka, admitted 6/21 for 1-2 weeks progressive pain and dry gangrene right foot. Id asked to evaluate for possible diabetic foot infection  Patient denies sx f/c or purulence/discharge/swellign off the right foot.  Here there is no evidence of sepsis  She came in mainly due to pain issue   An mri was obtained which mentioned no osseous abnormality except soft tissue edema  She is compliant with biktarvy and well controlled  She has a rash that is assymptomatic on rle even before dialysis. She saw dermatology previously and was biopsied but no tx/result offered  No  b sx, cough, chest pain, back pain, headache  Pain is better controlled  She does have a podiatrist (dr Ellard Artis) outpatient who had evaluated her right foot is not concerned for infection. He has her place betadine for wound care    Family History  Problem Relation Age of Onset   Diabetes Mother    Diabetes Brother    Diabetes Daughter    Diabetes Daughter    Mental retardation Brother        died from PNA   Diabetes Maternal Grandmother     Social History   Tobacco Use   Smoking status: Former    Packs/day: 0.10    Years: 10.00    Pack years: 1.00    Types: Cigarettes    Start date: 11/17/2013    Quit date: 09/09/2014    Years since quitting: 6.3   Smokeless tobacco: Never  Vaping Use   Vaping Use: Never used  Substance Use Topics   Alcohol use: Not Currently   Drug use: Not Currently    Types: Marijuana    Comment: marijuana last used 1 week 10-21-2020    No Known Allergies  Review of Systems: ROS All Other ROS was negative, except mentioned above   Past Medical History:  Diagnosis Date   Anal abscess    chronic   Anxiety    CKD (chronic kidney disease) stage 5, GFR less than 15  ml/min ([place]) 06/19/2016   . Suspect etiologies to include diabetes and hypertension though cannot exclude HIV-associated nephropathy. Advised her against from using excessive Goody powder.   Constipation    trouble stoll coming out   Depression 06/28/2006   Qualifier: Diagnosis of  By: Riccardo Dubin MD, [place]     Diabetes mellitus type 2 in obese ([place]) 06/28/1994   DKA (diabetic ketoacidoses) 12/2014   Dyspnea    uses oxygeb 2 liters per minute at dialysis   End stage renal disease on dialysis [facility])    Erosive esophagitis    Esophageal reflux    ESRD on hemodialysis ([place]) 05/02/2019   Eye redness    Gastroparesis    ? diabetic   GERD (gastroesophageal reflux disease)    History of blood transfusion 2019   History of hypertension    off bp meds 2020 due to low bp with dialysis    Human immunodeficiency virus (HIV) disease ([place]) [id number]   Metabolic bone disease [id number]   Moderate nonproliferative diabetic retinopathy of both eyes ([place]) 11/21/2014   11/14/14: Noted on retinal imaging; needs follow-up imaging in 6 months  05/22/16: Noted on retinal imaging again; needs follow-up imaging in 6 months   Normocytic anemia    Pruritus, unspecified 05/20/2019   Renal failure (ARF), acute on chronic ([place]) 11/24/2018   tues, thurs sat [address] [facility] since sept 2020   Shortness of breath 07/01/2019   when lays doan at night, musy prop head up on 2 regular and 2 small pillows   Type 2 diabetes mellitus with diabetic peripheral angiopathy without gangrene ([place]) 05/01/2019   Type II diabetes mellitus ([employer]) 1994   diagnosed around 1994   Wears dentures    lower   Wound dehiscence    Wound infection s/p L transmetatarsal amputation        Scheduled Meds:  aspirin EC  81 mg Oral Daily   atorvastatin  40 mg Oral Daily   bictegravir-emtricitabine-tenofovir AF  1 tablet Oral Daily   calcium acetate  1,334 mg Oral TID WC   Chlorhexidine Gluconate Cloth  6 each Topical Daily   heparin  5,000 Units Subcutaneous Q8H   insulin aspart  0-15 Units Subcutaneous TID WC   insulin glargine  5 Units Subcutaneous QHS   senna  1 tablet Oral BID   sertraline  50 mg Oral QHS   Continuous Infusions:  sodium chloride     sodium chloride     PRN Meds:.sodium chloride, sodium chloride, acetaminophen **OR** acetaminophen, alteplase, heparin, HYDROmorphone (DILAUDID) injection, lidocaine (PF), lidocaine-prilocaine, ondansetron (ZOFRAN) IV, oxyCODONE, pentafluoroprop-tetrafluoroeth, polyethylene glycol   OBJECTIVE: Blood pressure (!) 172/82, pulse 74, temperature 98.1 F (36.7 C), temperature source Oral, resp. rate 17, height '5\' 5"'$  (1.651 m), weight 75.3 kg, SpO2 100 %.  Physical Exam General/constitutional: no distress, pleasant HEENT: Normocephalic, PER, Conj Clear, EOMI, Oropharynx  clear; poor dentition Neck supple CV: rrr no mrg Lungs: clear to auscultation, normal respiratory effort Abd: Soft, Nontender Ext: no edema Skin: dry gangrene without purulence/discharge/swelling in the right distal forefoot; several papulonodular hyperpigmented scabs and atrophic scarring on distal RLE stable per patient x2 years Neuro: nonfocal MSK: s/p left bka Psych alert/oriented  Lab Results Lab Results  Component Value Date   WBC 7.2 01/30/2021   HGB 9.1 (L) 01/30/2021   HCT 28.0 (L) 01/30/2021   MCV 94.0 01/30/2021   PLT 349 01/30/2021    Lab Results  Component Value Date   CREATININE 8.70 (H) 01/30/2021  BUN 49 (H) 01/30/2021   NA 136 01/30/2021   K 4.4 01/30/2021   CL 89 (L) 01/30/2021   CO2 27 01/30/2021    Lab Results  Component Value Date   ALT 9 01/29/2021   AST 11 (L) 01/29/2021   ALKPHOS 54 01/29/2021   BILITOT 0.8 01/29/2021      Microbiology: Recent Results (from the past 240 hour(s))  Blood Culture (routine x 2)     Status: None (Preliminary result)   Collection Time: 01/28/21  6:12 PM   Specimen: BLOOD  Result Value Ref Range Status   Specimen Description BLOOD RIGHT ANTECUBITAL  Final   Special Requests   Final    BOTTLES DRAWN AEROBIC AND ANAEROBIC Blood Culture adequate volume   Culture   Final    NO GROWTH 2 DAYS Performed at [facility], [address] [place], [place] [address]    Report Status PENDING  Incomplete  Resp Panel by RT-PCR (Flu A&B, Covid) Nasopharyngeal Swab     Status: None   Collection Time: 01/29/21  2:42 AM   Specimen: Nasopharyngeal Swab; Nasopharyngeal(NP) swabs in vial transport medium  Result Value Ref Range Status   SARS Coronavirus 2 by RT PCR NEGATIVE NEGATIVE Final    Comment: (NOTE) SARS-CoV-2 target nucleic acids are NOT DETECTED.  The SARS-CoV-2 RNA is generally detectable in upper respiratory specimens during the acute phase of infection. The lowest concentration of SARS-CoV-2 viral copies  this assay can detect is 138 copies/mL. A negative result does not preclude SARS-Cov-2 infection and should not be used as the sole basis for treatment or other patient management decisions. A negative result may occur with  improper specimen collection/handling, submission of specimen other than nasopharyngeal swab, presence of viral mutation(s) within the areas targeted by this assay, and inadequate number of viral copies(<138 copies/mL). A negative result must be combined with clinical observations, patient history, and epidemiological information. The expected result is Negative.  Fact Sheet for Patients:  EntrepreneurPulse.com.au  Fact Sheet for Healthcare Providers:  IncredibleEmployment.be  This test is no t yet approved or cleared by the [place] FDA and  has been authorized for detection and/or diagnosis of SARS-CoV-2 by FDA under an Emergency Use Authorization (EUA). This EUA will remain  in effect (meaning this test can be used) for the duration of the COVID-19 declaration under Section 564(b)(1) of the Act, 21 U.S.C.section 360bbb-3(b)(1), unless the authorization is terminated  or revoked sooner.       Influenza A by PCR NEGATIVE NEGATIVE Final   Influenza B by PCR NEGATIVE NEGATIVE Final    Comment: (NOTE) The Xpert Xpress SARS-CoV-2/FLU/RSV plus assay is intended as an aid in the diagnosis of influenza from Nasopharyngeal swab specimens and should not be used as a sole basis for treatment. Nasal washings and aspirates are unacceptable for Xpert Xpress SARS-CoV-2/FLU/RSV testing.  Fact Sheet for Patients: EntrepreneurPulse.com.au  Fact Sheet for Healthcare Providers: IncredibleEmployment.be  This test is not yet approved or cleared by the [place] FDA and has been authorized for detection and/or diagnosis of SARS-CoV-2 by FDA under an Emergency Use Authorization (EUA). This EUA  will remain in effect (meaning this test can be used) for the duration of the COVID-19 declaration under Section 564(b)(1) of the Act, 21 U.S.C. section 360bbb-3(b)(1), unless the authorization is terminated or revoked.  Performed at [facility], [place] [address] [place], [place] [address]      Serology: Lab Results  Component Value Date  HIV1RNAQUANT <20 05/29/2020   Lab Results  Component Value Date   CD4TCELL 33 05/29/2020   CD4TABS 563 05/29/2020     Imaging: If present, new imagings (plain films, ct scans, and mri) have been personally visualized and interpreted; radiology reports have been reviewed. Decision making incorporated into the Impression / Recommendations.  6/22 mri right foot 1. Very limited examination due to patient motion. 2. Diffuse cellulitis and myositis but no obvious discrete subcutaneous abscess or pyomyositis. 3. No obvious findings for septic arthritis or osteomyelitis.     Jabier Mutton, [facility] [place] [phone] pager    01/30/2021, 11:59 AM

## 2021-01-31 ENCOUNTER — Encounter (HOSPITAL_COMMUNITY)
Admission: EM | Disposition: A | Discharge status: Rehabilitation Facility | Payer: Self-pay | Source: Home / Self Care | Attending: Internal Medicine

## 2021-01-31 ENCOUNTER — Inpatient Hospital Stay (HOSPITAL_COMMUNITY): Payer: Medicare Other | Admitting: Anesthesiology

## 2021-01-31 ENCOUNTER — Encounter (HOSPITAL_COMMUNITY): Payer: Self-pay | Admitting: Internal Medicine

## 2021-01-31 DIAGNOSIS — L97519 Non-pressure chronic ulcer of other part of right foot with unspecified severity: Secondary | ICD-10-CM

## 2021-01-31 HISTORY — PX: AMPUTATION: SHX166

## 2021-01-31 LAB — CBC
HCT: 29.1 % — ABNORMAL LOW (ref 36.0–46.0)
Hemoglobin: 9.4 g/dL — ABNORMAL LOW (ref 12.0–15.0)
MCH: 30.5 pg (ref 26.0–34.0)
MCHC: 32.3 g/dL (ref 30.0–36.0)
MCV: 94.5 fL (ref 80.0–100.0)
Platelets: 339 10*3/uL (ref 150–400)
RBC: 3.08 MIL/uL — ABNORMAL LOW (ref 3.87–5.11)
RDW: 16.5 % — ABNORMAL HIGH (ref 11.5–15.5)
WBC: 6 10*3/uL (ref 4.0–10.5)
nRBC: 0 % (ref 0.0–0.2)

## 2021-01-31 LAB — RENAL FUNCTION PANEL
Albumin: 2.7 g/dL — ABNORMAL LOW (ref 3.5–5.0)
Anion gap: 13 (ref 5–15)
BUN: 20 mg/dL (ref 6–20)
CO2: 26 mmol/L (ref 22–32)
Calcium: 8.5 mg/dL — ABNORMAL LOW (ref 8.9–10.3)
Chloride: 92 mmol/L — ABNORMAL LOW (ref 98–111)
Creatinine, Ser: 5.52 mg/dL — ABNORMAL HIGH (ref 0.44–1.00)
GFR, Estimated: 9 mL/min — ABNORMAL LOW (ref >60)
Glucose, Bld: 109 mg/dL — ABNORMAL HIGH (ref 70–99)
Phosphorus: 4.4 mg/dL (ref 2.5–4.6)
Potassium: 3.8 mmol/L (ref 3.5–5.1)
Sodium: 131 mmol/L — ABNORMAL LOW (ref 135–145)

## 2021-01-31 LAB — GLUCOSE, CAPILLARY
Glucose-Capillary: 97 mg/dL (ref 70–99)
Glucose-Capillary: 87 mg/dL (ref 70–99)
Glucose-Capillary: 309 mg/dL — ABNORMAL HIGH (ref 70–99)
Glucose-Capillary: 211 mg/dL — ABNORMAL HIGH (ref 70–99)

## 2021-01-31 SURGERY — AMPUTATION BELOW KNEE
Anesthesia: Regional | Site: Knee | Laterality: Right

## 2021-01-31 MED ORDER — FENTANYL CITRATE (PF) 100 MCG/2ML IJ SOLN
INTRAMUSCULAR | Status: AC
  Filled 2021-01-31: qty 2

## 2021-01-31 MED ORDER — MIDAZOLAM HCL 2 MG/2ML IJ SOLN
INTRAMUSCULAR | Status: AC
  Administered 2021-01-31: 2 mg via INTRAVENOUS
  Filled 2021-01-31: qty 2

## 2021-01-31 MED ORDER — FENTANYL CITRATE (PF) 100 MCG/2ML IJ SOLN
INTRAMUSCULAR | Status: AC
  Administered 2021-01-31: 100 ug via INTRAVENOUS
  Filled 2021-01-31: qty 2

## 2021-01-31 MED ORDER — HEPARIN SODIUM (PORCINE) 5000 UNIT/ML IJ SOLN
5000.0000 [IU] | Freq: Three times a day (TID) | INTRAMUSCULAR | Status: DC
  Administered 2021-01-31 – 2021-02-08 (×22): 5000 [IU] via SUBCUTANEOUS
  Filled 2021-01-31 (×23): qty 1

## 2021-01-31 MED ORDER — HYDRALAZINE HCL 20 MG/ML IJ SOLN
5.0000 mg | INTRAMUSCULAR | Status: AC | PRN
  Administered 2021-02-01 – 2021-02-02 (×2): 5 mg via INTRAVENOUS
  Filled 2021-01-31 (×2): qty 1

## 2021-01-31 MED ORDER — METOPROLOL TARTRATE 5 MG/5ML IV SOLN
2.0000 mg | INTRAVENOUS | Status: DC | PRN
  Filled 2021-01-31: qty 5

## 2021-01-31 MED ORDER — FENTANYL CITRATE (PF) 100 MCG/2ML IJ SOLN: 100.0000 ug | Freq: Once | INTRAMUSCULAR | Status: AC

## 2021-01-31 MED ORDER — DEXAMETHASONE SODIUM PHOSPHATE 10 MG/ML IJ SOLN
INTRAMUSCULAR | Status: DC | PRN
  Administered 2021-01-31: 5 mg via INTRAVENOUS

## 2021-01-31 MED ORDER — ONDANSETRON HCL 4 MG/2ML IJ SOLN: 4.0000 mg | Freq: Four times a day (QID) | INTRAMUSCULAR | Status: DC | PRN

## 2021-01-31 MED ORDER — DOCUSATE SODIUM 100 MG PO CAPS
100.0000 mg | ORAL_CAPSULE | Freq: Every day | ORAL | Status: DC
  Administered 2021-02-01 – 2021-02-04 (×4): 100 mg via ORAL
  Filled 2021-01-31 (×5): qty 1

## 2021-01-31 MED ORDER — CHLORHEXIDINE GLUCONATE 0.12 % MT SOLN
OROMUCOSAL | Status: AC
  Administered 2021-01-31: 15 mL via OROMUCOSAL
  Filled 2021-01-31: qty 15

## 2021-01-31 MED ORDER — SODIUM CHLORIDE 0.9 % IV SOLN: INTRAVENOUS | Status: DC

## 2021-01-31 MED ORDER — PHENYLEPHRINE HCL-NACL 10-0.9 MG/250ML-% IV SOLN
INTRAVENOUS | Status: DC | PRN
  Administered 2021-01-31: 40 ug/min via INTRAVENOUS

## 2021-01-31 MED ORDER — PANTOPRAZOLE SODIUM 40 MG PO TBEC
40.0000 mg | DELAYED_RELEASE_TABLET | Freq: Every day | ORAL | Status: DC
  Administered 2021-01-31 – 2021-02-08 (×9): 40 mg via ORAL
  Filled 2021-01-31 (×9): qty 1

## 2021-01-31 MED ORDER — PHENOL 1.4 % MT LIQD: 1.0000 | OROMUCOSAL | Status: DC | PRN

## 2021-01-31 MED ORDER — LIDOCAINE 2% (20 MG/ML) 5 ML SYRINGE
INTRAMUSCULAR | Status: AC
  Filled 2021-01-31: qty 5

## 2021-01-31 MED ORDER — SODIUM CHLORIDE 0.9% FLUSH: 3.0000 mL | INTRAVENOUS | Status: DC | PRN

## 2021-01-31 MED ORDER — SODIUM CHLORIDE 0.9% FLUSH
3.0000 mL | Freq: Two times a day (BID) | INTRAVENOUS | Status: DC
  Administered 2021-01-31 – 2021-02-08 (×14): 3 mL via INTRAVENOUS

## 2021-01-31 MED ORDER — GUAIFENESIN-DM 100-10 MG/5ML PO SYRP: 15.0000 mL | ORAL_SOLUTION | ORAL | Status: DC | PRN

## 2021-01-31 MED ORDER — SODIUM CHLORIDE 0.9 % IV SOLN: 250.0000 mL | INTRAVENOUS | Status: DC | PRN

## 2021-01-31 MED ORDER — CEFAZOLIN SODIUM-DEXTROSE 2-3 GM-%(50ML) IV SOLR
INTRAVENOUS | Status: DC | PRN
  Administered 2021-01-31: 2 g via INTRAVENOUS

## 2021-01-31 MED ORDER — ONDANSETRON HCL 4 MG/2ML IJ SOLN
INTRAMUSCULAR | Status: DC | PRN
  Administered 2021-01-31: 4 mg via INTRAVENOUS

## 2021-01-31 MED ORDER — MIDAZOLAM HCL 2 MG/2ML IJ SOLN
INTRAMUSCULAR | Status: AC
  Filled 2021-01-31: qty 2

## 2021-01-31 MED ORDER — FENTANYL CITRATE (PF) 100 MCG/2ML IJ SOLN
25.0000 ug | INTRAMUSCULAR | Status: DC | PRN
  Administered 2021-01-31: 50 ug via INTRAVENOUS

## 2021-01-31 MED ORDER — ORAL CARE MOUTH RINSE: 15.0000 mL | Freq: Once | OROMUCOSAL | Status: AC

## 2021-01-31 MED ORDER — SODIUM CHLORIDE 0.9 % IV SOLN
100.0000 mg | INTRAVENOUS | Status: DC
  Administered 2021-02-01 – 2021-02-04 (×2): 100 mg via INTRAVENOUS
  Filled 2021-01-31 (×8): qty 5

## 2021-01-31 MED ORDER — OXYCODONE HCL 5 MG PO TABS
5.0000 mg | ORAL_TABLET | ORAL | Status: DC | PRN
  Administered 2021-02-01 (×2): 10 mg via ORAL
  Administered 2021-02-01: 5 mg via ORAL
  Filled 2021-01-31: qty 2
  Filled 2021-01-31: qty 1

## 2021-01-31 MED ORDER — CHLORHEXIDINE GLUCONATE 0.12 % MT SOLN: 15.0000 mL | Freq: Once | OROMUCOSAL | Status: DC

## 2021-01-31 MED ORDER — MIDAZOLAM HCL 2 MG/2ML IJ SOLN: 2.0000 mg | Freq: Once | INTRAMUSCULAR | Status: AC

## 2021-01-31 MED ORDER — ORAL CARE MOUTH RINSE: 15.0000 mL | Freq: Once | OROMUCOSAL | Status: DC

## 2021-01-31 MED ORDER — LIDOCAINE HCL (CARDIAC) PF 100 MG/5ML IV SOSY: PREFILLED_SYRINGE | INTRAVENOUS | Status: DC | PRN

## 2021-01-31 MED ORDER — MAGNESIUM SULFATE 2 GM/50ML IV SOLN
2.0000 g | Freq: Every day | INTRAVENOUS | Status: DC | PRN
  Filled 2021-01-31: qty 50

## 2021-01-31 MED ORDER — LIDOCAINE 2% (20 MG/ML) 5 ML SYRINGE
INTRAMUSCULAR | Status: DC | PRN
  Administered 2021-01-31: 40 mg via INTRAVENOUS

## 2021-01-31 MED ORDER — CHLORHEXIDINE GLUCONATE 0.12 % MT SOLN: 15.0000 mL | Freq: Once | OROMUCOSAL | Status: AC

## 2021-01-31 MED ORDER — ROPIVACAINE HCL 5 MG/ML IJ SOLN
INTRAMUSCULAR | Status: DC | PRN
  Administered 2021-01-31: 30 mL via PERINEURAL

## 2021-01-31 MED ORDER — POTASSIUM CHLORIDE CRYS ER 20 MEQ PO TBCR: 20.0000 meq | EXTENDED_RELEASE_TABLET | Freq: Every day | ORAL | Status: DC | PRN

## 2021-01-31 MED ORDER — 0.9 % SODIUM CHLORIDE (POUR BTL) OPTIME
TOPICAL | Status: DC | PRN
  Administered 2021-01-31: 1000 mL

## 2021-01-31 MED ORDER — ALUM & MAG HYDROXIDE-SIMETH 200-200-20 MG/5ML PO SUSP: 15.0000 mL | ORAL | Status: DC | PRN

## 2021-01-31 MED ORDER — PROPOFOL 10 MG/ML IV BOLUS
INTRAVENOUS | Status: DC | PRN
  Administered 2021-01-31: 150 mg via INTRAVENOUS

## 2021-01-31 MED ORDER — ACETAMINOPHEN 325 MG PO TABS: 325.0000 mg | ORAL_TABLET | Freq: Four times a day (QID) | ORAL | Status: DC | PRN

## 2021-01-31 MED ORDER — LABETALOL HCL 5 MG/ML IV SOLN
10.0000 mg | INTRAVENOUS | Status: DC | PRN
  Administered 2021-02-03: 10 mg via INTRAVENOUS
  Filled 2021-01-31 (×2): qty 4

## 2021-01-31 MED ORDER — CEFAZOLIN SODIUM 1 G IJ SOLR
INTRAMUSCULAR | Status: AC
  Filled 2021-01-31: qty 20

## 2021-01-31 MED ORDER — ONDANSETRON HCL 4 MG/2ML IJ SOLN
INTRAMUSCULAR | Status: AC
  Filled 2021-01-31: qty 2

## 2021-01-31 MED ORDER — PROMETHAZINE HCL 25 MG/ML IJ SOLN: 6.2500 mg | INTRAMUSCULAR | Status: DC | PRN

## 2021-01-31 SURGICAL SUPPLY — 60 items
BANDAGE ESMARK 6X9 LF (GAUZE/BANDAGES/DRESSINGS) IMPLANT
BLADE SAW GIGLI 510 (BLADE) ×2 IMPLANT
BNDG CMPR 9X6 STRL LF SNTH (GAUZE/BANDAGES/DRESSINGS)
BNDG COHESIVE 6X5 TAN STRL LF (GAUZE/BANDAGES/DRESSINGS) ×2 IMPLANT
BNDG ELASTIC 4X5.8 VLCR STR LF (GAUZE/BANDAGES/DRESSINGS) ×2 IMPLANT
BNDG ELASTIC 6X5.8 VLCR STR LF (GAUZE/BANDAGES/DRESSINGS) ×2 IMPLANT
BNDG ESMARK 6X9 LF (GAUZE/BANDAGES/DRESSINGS)
BNDG GAUZE ELAST 4 BULKY (GAUZE/BANDAGES/DRESSINGS) IMPLANT
BRUSH SCRUB EZ PLAIN DRY (MISCELLANEOUS) ×2 IMPLANT
CANISTER SUCT 3000ML PPV (MISCELLANEOUS) ×2 IMPLANT
CLIP VESOCCLUDE MED 6/CT (CLIP) ×1 IMPLANT
CLIP VESOCCLUDE SM WIDE 6/CT (CLIP) ×1 IMPLANT
COVER SURGICAL LIGHT HANDLE (MISCELLANEOUS) ×2 IMPLANT
COVER WAND RF STERILE (DRAPES) ×1 IMPLANT
CUFF TOURN SGL QUICK 34 (TOURNIQUET CUFF)
CUFF TOURN SGL QUICK 42 (TOURNIQUET CUFF) IMPLANT
CUFF TRNQT CYL 34X4.125X (TOURNIQUET CUFF) IMPLANT
DRAIN CHANNEL 19F RND (DRAIN) IMPLANT
DRAPE EXTREMITY T 121X128X90 (DISPOSABLE) ×1 IMPLANT
DRAPE HALF SHEET 40X57 (DRAPES) ×2 IMPLANT
DRAPE INCISE IOBAN 66X45 STRL (DRAPES) IMPLANT
DRAPE ORTHO SPLIT 77X108 STRL (DRAPES)
DRAPE SURG ORHT 6 SPLT 77X108 (DRAPES) ×2 IMPLANT
DRESSING PREVENA PLUS CUSTOM (GAUZE/BANDAGES/DRESSINGS) IMPLANT
DRSG ADAPTIC 3X8 NADH LF (GAUZE/BANDAGES/DRESSINGS) ×2 IMPLANT
DRSG PREVENA PLUS CUSTOM (GAUZE/BANDAGES/DRESSINGS)
ELECT CAUTERY BLADE 6.4 (BLADE) ×1 IMPLANT
ELECT REM PT RETURN 9FT ADLT (ELECTROSURGICAL) ×2
ELECTRODE REM PT RTRN 9FT ADLT (ELECTROSURGICAL) ×1 IMPLANT
EVACUATOR SILICONE 100CC (DRAIN) IMPLANT
GAUZE 4X4 16PLY RFD (DISPOSABLE) ×2 IMPLANT
GAUZE SPONGE 4X4 12PLY STRL (GAUZE/BANDAGES/DRESSINGS) ×2 IMPLANT
GLOVE SURG MICRO LTX SZ7 (GLOVE) ×1 IMPLANT
GLOVE SURG POLYISO LF SZ7.5 (GLOVE) ×3 IMPLANT
GLOVE SURG UNDER POLY LF SZ7.5 (GLOVE) ×2 IMPLANT
GOWN STRL REUS W/ TWL LRG LVL3 (GOWN DISPOSABLE) ×2 IMPLANT
GOWN STRL REUS W/ TWL XL LVL3 (GOWN DISPOSABLE) ×1 IMPLANT
GOWN STRL REUS W/TWL LRG LVL3 (GOWN DISPOSABLE) ×4
GOWN STRL REUS W/TWL XL LVL3 (GOWN DISPOSABLE) ×2
KIT BASIN OR (CUSTOM PROCEDURE TRAY) ×2 IMPLANT
KIT TURNOVER KIT B (KITS) ×2 IMPLANT
NS IRRIG 1000ML POUR BTL (IV SOLUTION) ×2 IMPLANT
PACK GENERAL/GYN (CUSTOM PROCEDURE TRAY) ×2 IMPLANT
PAD ARMBOARD 7.5X6 YLW CONV (MISCELLANEOUS) ×4 IMPLANT
PREVENA RESTOR ARTHOFORM 46X30 (CANNISTER) IMPLANT
STAPLER VISISTAT 35W (STAPLE) ×2 IMPLANT
STOCKINETTE IMPERVIOUS LG (DRAPES) ×2 IMPLANT
SUT BONE WAX W31G (SUTURE) IMPLANT
SUT ETHILON 3 0 PS 1 (SUTURE) IMPLANT
SUT SILK 0 TIES 10X30 (SUTURE) ×2 IMPLANT
SUT SILK 2 0 (SUTURE) ×2
SUT SILK 2 0 SH CR/8 (SUTURE) ×2 IMPLANT
SUT SILK 2-0 18XBRD TIE 12 (SUTURE) IMPLANT
SUT SILK 3 0 (SUTURE) ×2
SUT SILK 3-0 18XBRD TIE 12 (SUTURE) IMPLANT
SUT VIC AB 2-0 CT1 18 (SUTURE) ×6 IMPLANT
TAPE UMBILICAL COTTON 1/8X30 (MISCELLANEOUS) ×1 IMPLANT
TOWEL GREEN STERILE (TOWEL DISPOSABLE) ×4 IMPLANT
UNDERPAD 30X36 HEAVY ABSORB (UNDERPADS AND DIAPERS) ×2 IMPLANT
WATER STERILE IRR 1000ML POUR (IV SOLUTION) ×2 IMPLANT

## 2021-01-31 NOTE — Op Note (Signed)
    Patient name: Kathy Frank MRN: [id number] DOB: January 20, 1976 Sex: female  01/31/2021 Pre-operative Diagnosis: Right foot ulcer Post-operative diagnosis:  Same Surgeon:  Annamarie Major Assistants: Laurence Slate Procedure:   Right below-knee amputation Anesthesia: Regional block and general Blood Loss: 100 cc Specimens: Right leg  Findings: Healthy muscle and excellent capillary bleeding at the amputation site  Indications: This is a 45 year old female who has previously gone left above-knee amputation who has nonhealing wounds to her right foot.  She has no options for revascularization.  She is agreed to proceed with below-knee amputation for pain and infection control.  Procedure:  The patient was identified in the holding area and taken to [place] [facility]  The patient was then placed supine on the table. general anesthesia was administered.  The patient was prepped and draped in the usual sterile fashion.  A time out was called and antibiotics were administered.  A regional block was placed by anesthesia in the holding area.  A PA was necessary to expedite procedure and assist with technical details.  A circumferential Measurement was made 12 cm below the tibial tuberosity.  This was used to create a two thirds, one third posterior flap.  The incision was made with a 10 blade.  Cautery was used about subcutaneous tissue and fascia.  Using cautery, circumferential control of the tibia was performed.  The periosteal elevator was used to elevate the periosteum.  Next, I isolated the tibia and removed the periosteum with an elevator.  A Gigli saw was then used to transect the tibia, beveling the anterior surface.  Double-action bone cutters were then used to transect the fibula proximal to the cut edge of the tibia.  Cautery was used to divide the remaining muscle.  I identified the neurovascular bundles and individually isolated the vessels with clamps.  The neurovascular bundles were then divided.   Cautery was then used to divide the remaining muscle and the leg was removed as a specimen to pathology.  I then further dissected out the neurovascular bundle and ligated the artery and vein with 0 silk ties proximal to the cut edge of the tibia.  The nerve was then ligated proximal to the cut edge of the tibia with a 0 silk tie.  Next, the wound was irrigated.  Hemostasis was achieved.  The fascia was reapproximated with interrupted 3-0 Vicryl and the skin was closed with staples.  Vaseline gauze followed by a stump shrinker and limb protector were placed.  The patient was successfully extubated taken recovery in stable condition.  There were no immediate complications.   Disposition: To PACU stable.   Theotis Burrow, M.D., [facility] [place] Office: [phone] Pager:  [phone]

## 2021-01-31 NOTE — Plan of Care (Signed)

## 2021-01-31 NOTE — Progress Notes (Signed)
This nurse presented Informed Consent form to patient to sign but she declined to signed. Patient is requesting for Surgeon to go over the procedure again before she makes a final decision. Will endorse to next shift.

## 2021-01-31 NOTE — Progress Notes (Signed)
Patient called me back into room and asked me to bring Informed consent form to sign. Placed in chart.

## 2021-01-31 NOTE — Progress Notes (Addendum)
[facility] Progress Note   Subjective: Seen in room. Calm post R BKA. Says she thought about it and was afraid she get an infection and die. HD tomorrow on schedule.   Objective Vitals:   01/31/21 1210 01/31/21 1220 01/31/21 1225 01/31/21 1252  BP: (!) 156/83  (!) 160/73 (!) 162/85  Pulse: 72  72 72  Resp: '18  16 16  '$ Temp:   97.6 F (36.4 C) 98.4 F (36.9 C)  TempSrc:    Oral  SpO2: 96% 92% 94% 98%  Weight:      Height:       Physical Exam General: Pleasant chronically ill appearing female in NAD Heart: S1,S2 RRR No M/R/G Lungs: CTAB. No WOB Abdomen:S, NT Extremities:L AKA no stump edema. R BKA with stump protector in place.  Dialysis Access: L AVF +T/B   Additional Objective Labs: Basic Metabolic Panel: Recent Labs  Lab 01/29/21 1607 01/30/21 0105 01/31/21 0239  NA 137 136 131*  K 4.1 4.4 3.8  CL 90* 89* 92*  CO2 '27 27 26  '$ GLUCOSE 139* 166* 109*  BUN 46* 49* 20  CREATININE 8.06* 8.70* 5.52*  CALCIUM 8.8* 8.8* 8.5*  PHOS  --   --  4.4   Liver Function Tests: Recent Labs  Lab 01/28/21 1803 01/29/21 1607 01/31/21 0239  AST 14* 11*  --   ALT 9 9  --   ALKPHOS 54 54  --   BILITOT 0.7 0.8  --   PROT 8.4* 7.7  --   ALBUMIN 2.9* 2.8* 2.7*   No results for input(s): LIPASE, AMYLASE in the last 168 hours. CBC: Recent Labs  Lab 01/28/21 1803 01/29/21 1607 01/30/21 0105 01/31/21 0239  WBC 6.4 6.4 7.2 6.0  NEUTROABS 4.8  --   --   --   HGB 9.0* 9.5* 9.1* 9.4*  HCT 27.8* 29.8* 28.0* 29.1*  MCV 93.6 96.4 94.0 94.5  PLT 375 349 349 339   Blood Culture    Component Value Date/Time   SDES BLOOD RIGHT ANTECUBITAL 01/28/2021 1812   SPECREQUEST  01/28/2021 1812    BOTTLES DRAWN AEROBIC AND ANAEROBIC Blood Culture adequate volume   CULT  01/28/2021 1812    NO GROWTH 2 DAYS Performed at [facility], [place] [address], [place], [place] [address]    REPTSTATUS PENDING 01/28/2021 1812    Cardiac Enzymes: No results for input(s):  CKTOTAL, CKMB, CKMBINDEX, TROPONINI in the last 168 hours. CBG: Recent Labs  Lab 01/30/21 1208 01/30/21 1907 01/30/21 2212 01/31/21 0747 01/31/21 1158  GLUCAP 224* 103* 141* 97 87   Iron Studies:  Recent Labs    01/29/21 1607  FERRITIN 421*   '@lablastinr3'$ @ Studies/Results: No results found. Medications:  sodium chloride     magnesium sulfate bolus IVPB      aspirin EC  81 mg Oral Daily   atorvastatin  40 mg Oral Daily   bictegravir-emtricitabine-tenofovir AF  1 tablet Oral Daily   calcitRIOL  1.25 mcg Oral Daily   calcium acetate  1,334 mg Oral TID WC   Chlorhexidine Gluconate Cloth  6 each Topical Daily   [START ON 02/06/2021] darbepoetin (ARANESP) injection - DIALYSIS  100 mcg Intravenous Q Thu-HD   [START ON 02/01/2021] docusate sodium  100 mg Oral Daily   feeding supplement (NEPRO CARB STEADY)  237 mL Oral BID BM   fentaNYL       heparin  5,000 Units Subcutaneous Q8H   insulin aspart  0-15 Units Subcutaneous  TID WC   insulin glargine  5 Units Subcutaneous QHS   multivitamin  1 tablet Oral QHS   pantoprazole  40 mg Oral Daily   senna  1 tablet Oral BID   sertraline  50 mg Oral QHS   sodium chloride flush  3 mL Intravenous Q12H     Dialysis Orders: Center: [facility] on TTS. 4 hrs 180NRe 400/Autoflow 1.5 2.0 k/ 2.0 Ca AVF -Heparin 3000 units IV TIW -Calcitriol 1.25 mcg p.o. q. 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 Per VVS. ESRD: T,Th,S Next HD 02/01/2021. Use 3.0 K bath, no heparin  Hypertension/volume: BP elevated. No antihypertensive meds on home med list-apparently HTN is volume controlled. Na has fallen since admission. No L stump edema/lungs are clear but concerned she has excess volume with HTN. Attempt to lower volume as tolerated in HD tomorrow. Lower EDW on discharge post BKA Anemia of ESRD: Hgb 9.1 Last ESA given  01/23/21. Expect fall in HGB post op. Follow HGB. Transfuse if needed. Resume Fe load post BKA.  Metabolic bone disease: Continue binders- phoslo , VDRA- calcitriol  Nutrition: Albumin low. renal carb modified diet albumin today protein supplement with Nepro HIV-meds per primary Diabetes mellitus type 2-per primary Depressive disorder on sertraline per primary  [name] 01/31/2021, 3:32 PM  [facility] [phone]  Patient seen and examined, agree with above note with above modifications.  Doing well after surgery-  pain under control- HD tomorrow on schedule with UF as tolerated given high BP and low sodium.  Follow renal relevant labs and change medications accordingly  Corliss Parish, MD 01/31/2021

## 2021-01-31 NOTE — Transfer of Care (Signed)
Immediate Anesthesia Transfer of Care Note  Patient: Kathy Frank  Procedure(s) Performed: AMPUTATION BELOW KNEE (Right: Knee)  Patient Location: PACU  Anesthesia Type:GA combined with regional for post-op pain  Level of Consciousness: drowsy  Airway & Oxygen Therapy: Patient Spontanous Breathing and Patient connected to face mask oxygen  Post-op Assessment: Report given to RN and Post -op Vital signs reviewed and stable  Post vital signs: Reviewed and stable  Last Vitals:  Vitals Value Taken Time  BP 170/78 01/31/21 1156  Temp    Pulse 71 01/31/21 1157  Resp 13 01/31/21 1157  SpO2 100 % 01/31/21 1157  Vitals shown include unvalidated device data.  Last Pain:  Vitals:   01/31/21 0910  TempSrc: Oral  PainSc: 7          Complications: No notable events documented.

## 2021-01-31 NOTE — Anesthesia Preprocedure Evaluation (Addendum)
Anesthesia Evaluation  Patient identified by MRN, date of birth, ID band Patient awake    Reviewed: Allergy & Precautions, NPO status , Patient's Chart, lab work & pertinent test results  Airway Mallampati: II  TM Distance: >3 FB Neck ROM: Full    Dental  (+) Missing, Poor Dentition   Pulmonary neg pulmonary ROS, former smoker,    Pulmonary exam normal        Cardiovascular hypertension, + Peripheral Vascular Disease   Rhythm:Regular Rate:Normal     Neuro/Psych  Headaches, Anxiety Depression    GI/Hepatic Neg liver ROS, PUD, GERD  ,  Endo/Other  diabetes, Poorly Controlled, Type 2, Insulin Dependent  Renal/GU Dialysis and ESRFRenal disease  negative genitourinary   Musculoskeletal negative musculoskeletal ROS (+)   Abdominal (+)  Abdomen: soft. Bowel sounds: normal.  Peds  Hematology  (+) anemia , HIV,   Anesthesia Other Findings   Reproductive/Obstetrics                          Anesthesia Physical Anesthesia Plan  ASA: 3  Anesthesia Plan: General and Regional   Post-op Pain Management:  Regional for Post-op pain   Induction: Intravenous  PONV Risk Score and Plan: 3 and Ondansetron, Dexamethasone and Midazolam  Airway Management Planned: Mask and LMA  Additional Equipment: None  Intra-op Plan:   Post-operative Plan: Extubation in OR  Informed Consent: I have reviewed the patients History and Physical, chart, labs and discussed the procedure including the risks, benefits and alternatives for the proposed anesthesia with the patient or authorized representative who has indicated his/her understanding and acceptance.     Dental advisory given  Plan Discussed with: CRNA  Anesthesia Plan Comments: (Lab Results      Component                Value               Date                      WBC                      6.0                 01/31/2021                HGB                       9.4 (L)             01/31/2021                HCT                      29.1 (L)            01/31/2021                MCV                      94.5                01/31/2021                PLT                      339  01/31/2021           Lab Results      Component                Value               Date                      NA                       131 (L)             01/31/2021                K                        3.8                 01/31/2021                CO2                      26                  01/31/2021                GLUCOSE                  109 (H)             01/31/2021                BUN                      20                  01/31/2021                CREATININE               5.52 (H)            01/31/2021                CALCIUM                  8.5 (L)             01/31/2021                GFRNONAA                 9 (L)               01/31/2021                GFRAA                    5 (L)               01/06/2020          )       Anesthesia Quick Evaluation

## 2021-01-31 NOTE — Anesthesia Procedure Notes (Signed)
Anesthesia Regional Block: Adductor canal block   Pre-Anesthetic Checklist: , timeout performed,  Correct Patient, Correct Site, Correct Laterality,  Correct Procedure, Correct Position, site marked,  Risks and benefits discussed,  Surgical consent,  Pre-op evaluation,  At surgeon's request and post-op pain management  Laterality: Right  Prep: Dura Prep       Needles:  Injection technique: Single-shot  Needle Type: Echogenic Stimulator Needle     Needle Length: 10cm  Needle Gauge: 20     Additional Needles:   Procedures:,,,, ultrasound used (permanent image in chart),,    Narrative:  Start time: 01/31/2021 10:13 AM End time: 01/31/2021 10:16 AM Injection made incrementally with aspirations every 5 mL.  Performed by: Personally  Anesthesiologist: Darral Dash, DO  Additional Notes: Patient identified. Risks/Benefits/Options discussed with patient including but not limited to bleeding, infection, nerve damage, failed block, incomplete pain control. Patient expressed understanding and wished to proceed. All questions were answered. Sterile technique was used throughout the entire procedure. Please see nursing notes for vital signs. Aspirated in 5cc intervals with injection for negative confirmation. Patient was given instructions on fall risk and not to get out of bed. All questions and concerns addressed with instructions to call with any issues or inadequate analgesia.

## 2021-01-31 NOTE — Interval H&P Note (Signed)
History and Physical Interval Note:  01/31/2021 9:21 AM  Kathy Frank  has presented today for surgery, with the diagnosis of ESRD.  The various methods of treatment have been discussed with the patient and family. After consideration of risks, benefits and other options for treatment, the patient has consented to  Procedure(s): AMPUTATION BELOW KNEE (Right) as a surgical intervention.  The patient's history has been reviewed, patient examined, no change in status, stable for surgery.  I have reviewed the patient's chart and labs.  Questions were answered to the patient's satisfaction.     [name]  Patient is now agreeable to right Below knee amputation.  Procedure details discussed.  All questions answered  WB

## 2021-01-31 NOTE — Anesthesia Procedure Notes (Signed)
Anesthesia Regional Block: Popliteal block   Pre-Anesthetic Checklist: , timeout performed,  Correct Patient, Correct Site, Correct Laterality,  Correct Procedure, Correct Position, site marked,  Risks and benefits discussed,  Surgical consent,  Pre-op evaluation,  At surgeon's request and post-op pain management  Laterality: Right  Prep: Dura Prep       Needles:  Injection technique: Single-shot  Needle Type: Echogenic Stimulator Needle     Needle Length: 10cm  Needle Gauge: 20     Additional Needles:   Procedures:,,,, ultrasound used (permanent image in chart),,    Narrative:  Start time: 01/31/2021 10:17 AM End time: 01/31/2021 10:20 AM Injection made incrementally with aspirations every 5 mL.  Performed by: Personally  Anesthesiologist: Darral Dash, DO  Additional Notes: Patient identified. Risks/Benefits/Options discussed with patient including but not limited to bleeding, infection, nerve damage, failed block, incomplete pain control. Patient expressed understanding and wished to proceed. All questions were answered. Sterile technique was used throughout the entire procedure. Please see nursing notes for vital signs. Aspirated in 5cc intervals with injection for negative confirmation. Patient was given instructions on fall risk and not to get out of bed. All questions and concerns addressed with instructions to call with any issues or inadequate analgesia.

## 2021-01-31 NOTE — Progress Notes (Signed)
   Subjective: Patient was seen this morning.  Pt states her temperature was high this morning but she denies increased pain in Rt leg. Objective: Vital signs in last 24 hours: Vitals:   01/30/21 1800 01/30/21 1911 01/30/21 2049 01/31/21 0611  BP: (!) 159/71 (!) 157/78 (!) 156/69 (!) 171/83  Pulse: 79 84 80 79  Resp: '16 15 16 18  '$ Temp:  99.6 F (37.6 C) 99.7 F (37.6 C) 99.7 F (37.6 C)  TempSrc:  Oral Oral Oral  SpO2: 98% 100% 98% 94%  Weight:      Height:        Physical Exam Constitutional: Patient lying in hospital bed, transferring to surgery. Not in acute distress HEENT: Atraumatic, no icterus, mucous membranes moist. Cardiovascular: Regular rate and rhythm, normal heart sounds. No Murmurs.   Pulmonary: Effort normal, Lungs clear to auscultation.  Abdominal: Flat, nondistended Musculoskeletal: Right leg gangrene fourth and fifth metatarsals.  No discharge on exam.  Left AKA  Skin warm and dry Neurological: Alert and oriented, no focal deficits. Psychiatric: Anxious mood and affect  Assessment/Plan: Principal Problem:   Gangrene of right foot (HCC) Active Problems:   Diabetes mellitus type 2 in obese (HCC)   Human immunodeficiency virus (HIV) disease ([place])   ESRD (end stage renal disease) ([place])   Anemia due to chronic kidney disease   Peripheral arterial disease ([place])  Kathy Frank is a 45 year old female admitted for worsening right foot pain in the setting of gangrene secondary to severe PAD.  #Gangrene of right foot - BKA planned for today by Dr. Chaya Jan today.   #ESRD, Tuesdays, Thursdays, Saturday -HD per nephrology  #Type 2 diabetes  -Continue CBGs, Lantus 5 units ,SSI for diabetes.  Dispo: Discharge pending surgery.   Armando Reichert, MD 01/31/2021, 6:38 AM Pager: [phone] After 5pm on weekdays and 1pm on weekends: Please call On Call pager [phone]

## 2021-01-31 NOTE — Anesthesia Procedure Notes (Signed)
Procedure Name: LMA Insertion Date/Time: 01/31/2021 10:38 AM Performed by: Reece Agar, CRNA Pre-anesthesia Checklist: Patient identified, Emergency Drugs available, Suction available and Patient being monitored Patient Re-evaluated:Patient Re-evaluated prior to induction Oxygen Delivery Method: Circle System Utilized Preoxygenation: Pre-oxygenation with 100% oxygen Induction Type: IV induction Ventilation: Mask ventilation without difficulty LMA: LMA inserted LMA Size: 4.0 Number of attempts: 1 Airway Equipment and Method: Bite block Placement Confirmation: positive ETCO2 Tube secured with: Tape Dental Injury: Teeth and Oropharynx as per pre-operative assessment

## 2021-02-01 LAB — GLUCOSE, CAPILLARY
Glucose-Capillary: 247 mg/dL — ABNORMAL HIGH (ref 70–99)
Glucose-Capillary: 157 mg/dL — ABNORMAL HIGH (ref 70–99)
Glucose-Capillary: 161 mg/dL — ABNORMAL HIGH (ref 70–99)
Glucose-Capillary: 118 mg/dL — ABNORMAL HIGH (ref 70–99)
Glucose-Capillary: 118 mg/dL — ABNORMAL HIGH (ref 70–99)

## 2021-02-01 LAB — BASIC METABOLIC PANEL
Anion gap: 17 — ABNORMAL HIGH (ref 5–15)
BUN: 40 mg/dL — ABNORMAL HIGH (ref 6–20)
CO2: 26 mmol/L (ref 22–32)
Calcium: 9.1 mg/dL (ref 8.9–10.3)
Chloride: 90 mmol/L — ABNORMAL LOW (ref 98–111)
Creatinine, Ser: 8.31 mg/dL — ABNORMAL HIGH (ref 0.44–1.00)
GFR, Estimated: 6 mL/min — ABNORMAL LOW (ref >60)
Glucose, Bld: 283 mg/dL — ABNORMAL HIGH (ref 70–99)
Potassium: 4.2 mmol/L (ref 3.5–5.1)
Sodium: 133 mmol/L — ABNORMAL LOW (ref 135–145)

## 2021-02-01 LAB — CBC
HCT: 32.3 % — ABNORMAL LOW (ref 36.0–46.0)
Hemoglobin: 10.1 g/dL — ABNORMAL LOW (ref 12.0–15.0)
MCH: 29.8 pg (ref 26.0–34.0)
MCHC: 31.3 g/dL (ref 30.0–36.0)
MCV: 95.3 fL (ref 80.0–100.0)
Platelets: 352 10*3/uL (ref 150–400)
RBC: 3.39 MIL/uL — ABNORMAL LOW (ref 3.87–5.11)
RDW: 16.3 % — ABNORMAL HIGH (ref 11.5–15.5)
WBC: 6.3 10*3/uL (ref 4.0–10.5)
nRBC: 0 % (ref 0.0–0.2)

## 2021-02-01 MED ORDER — GABAPENTIN 100 MG PO CAPS: 100.0000 mg | ORAL_CAPSULE | ORAL | Status: DC

## 2021-02-01 MED ORDER — OXYCODONE HCL 5 MG PO TABS
ORAL_TABLET | ORAL | Status: AC
  Filled 2021-02-01: qty 2

## 2021-02-01 MED ORDER — HYDROMORPHONE HCL 1 MG/ML IJ SOLN
0.5000 mg | INTRAMUSCULAR | Status: DC | PRN
  Administered 2021-02-01 – 2021-02-04 (×5): 0.5 mg via INTRAVENOUS
  Filled 2021-02-01 (×5): qty 0.5

## 2021-02-01 MED ORDER — GABAPENTIN 100 MG PO CAPS: 100.0000 mg | ORAL_CAPSULE | Freq: Three times a day (TID) | ORAL | Status: DC

## 2021-02-01 MED ORDER — SODIUM CHLORIDE 0.9 % IV SOLN: 100.0000 mL | INTRAVENOUS | Status: DC | PRN

## 2021-02-01 MED ORDER — OXYCODONE HCL 5 MG PO TABS
5.0000 mg | ORAL_TABLET | ORAL | Status: DC | PRN
  Administered 2021-02-01 – 2021-02-02 (×2): 10 mg via ORAL
  Filled 2021-02-01 (×2): qty 2

## 2021-02-01 MED ORDER — GABAPENTIN 100 MG PO CAPS
100.0000 mg | ORAL_CAPSULE | ORAL | Status: DC
  Administered 2021-02-01 – 2021-02-04 (×2): 100 mg via ORAL
  Filled 2021-02-01 (×3): qty 1

## 2021-02-01 MED ORDER — HYDROMORPHONE HCL 1 MG/ML IJ SOLN
INTRAMUSCULAR | Status: AC
  Administered 2021-02-01: 0.5 mg via INTRAVENOUS
  Filled 2021-02-01: qty 0.5

## 2021-02-01 NOTE — Progress Notes (Signed)
[facility] Progress Note   Subjective: Seen on dialysis -  says did not sleep well due to pain-  possible adjustment in the works to pain meds   Objective Vitals:   02/01/21 0551 02/01/21 0833 02/01/21 0834 02/01/21 0838  BP: (!) 185/78 (!) 156/82 (!) 153/87 127/78  Pulse: 73 63 74 72  Resp: '17 16 18   '$ Temp: 98.1 F (36.7 C) 98.3 F (36.8 C)    TempSrc: Oral Oral    SpO2: 99% 97%    Weight: 80.1 kg 75.7 kg    Height:       Physical Exam General: Pleasant chronically ill appearing female in NAD Heart: S1,S2 RRR No M/R/G Lungs: CTAB. No WOB Abdomen:S, NT Extremities:L AKA no stump edema. R BKA with stump protector in place.  Dialysis Access: L AVF +T/B   Additional Objective Labs: Basic Metabolic Panel: Recent Labs  Lab 01/30/21 0105 01/31/21 0239 02/01/21 0042  NA 136 131* 133*  K 4.4 3.8 4.2  CL 89* 92* 90*  CO2 '27 26 26  '$ GLUCOSE 166* 109* 283*  BUN 49* 20 40*  CREATININE 8.70* 5.52* 8.31*  CALCIUM 8.8* 8.5* 9.1  PHOS  --  4.4  --    Liver Function Tests: Recent Labs  Lab 01/28/21 1803 01/29/21 1607 01/31/21 0239  AST 14* 11*  --   ALT 9 9  --   ALKPHOS 54 54  --   BILITOT 0.7 0.8  --   PROT 8.4* 7.7  --   ALBUMIN 2.9* 2.8* 2.7*   No results for input(s): LIPASE, AMYLASE in the last 168 hours. CBC: Recent Labs  Lab 01/28/21 1803 01/29/21 1607 01/30/21 0105 01/31/21 0239 02/01/21 0042  WBC 6.4 6.4 7.2 6.0 6.3  NEUTROABS 4.8  --   --   --   --   HGB 9.0* 9.5* 9.1* 9.4* 10.1*  HCT 27.8* 29.8* 28.0* 29.1* 32.3*  MCV 93.6 96.4 94.0 94.5 95.3  PLT 375 349 349 339 352   Blood Culture    Component Value Date/Time   SDES BLOOD RIGHT ANTECUBITAL 01/28/2021 1812   SPECREQUEST  01/28/2021 1812    BOTTLES DRAWN AEROBIC AND ANAEROBIC Blood Culture adequate volume   CULT  01/28/2021 1812    NO GROWTH 3 DAYS Performed at [facility] [address], [place], [place] [address]    REPTSTATUS PENDING 01/28/2021 1812     Cardiac Enzymes: No results for input(s): CKTOTAL, CKMB, CKMBINDEX, TROPONINI in the last 168 hours. CBG: Recent Labs  Lab 01/31/21 0747 01/31/21 1158 01/31/21 1709 01/31/21 2210 02/01/21 0754  GLUCAP 97 87 309* 211* 247*   Iron Studies:  Recent Labs    01/29/21 1607  FERRITIN 421*   '@lablastinr3'$ @ Studies/Results: No results found. Medications:  sodium chloride     sodium chloride     sodium chloride     iron sucrose     magnesium sulfate bolus IVPB      aspirin EC  81 mg Oral Daily   atorvastatin  40 mg Oral Daily   bictegravir-emtricitabine-tenofovir AF  1 tablet Oral Daily   calcitRIOL  1.25 mcg Oral Daily   calcium acetate  1,334 mg Oral TID WC   Chlorhexidine Gluconate Cloth  6 each Topical Daily   [START ON 02/06/2021] darbepoetin (ARANESP) injection - DIALYSIS  100 mcg Intravenous Q Thu-HD   docusate sodium  100 mg Oral Daily   feeding supplement (NEPRO CARB STEADY)  237 mL Oral BID  BM   heparin  5,000 Units Subcutaneous Q8H   insulin aspart  0-15 Units Subcutaneous TID WC   insulin glargine  5 Units Subcutaneous QHS   multivitamin  1 tablet Oral QHS   pantoprazole  40 mg Oral Daily   senna  1 tablet Oral BID   sertraline  50 mg Oral QHS   sodium chloride flush  3 mL Intravenous Q12H     Dialysis Orders: Center: [facility] on TTS. 4 hrs 180NRe 400/Autoflow 1.5 2.0 k/ 2.0 Ca AVF -Heparin 3000 units IV TIW -Calcitriol 1.25 mcg p.o. q. 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 Per [facility]. ESRD: T,Th,S Next HD today-  Use 3.0 K bath, no heparin  Hypertension/volume: BP elevated. No antihypertensive meds on home med list-apparently HTN is volume controlled. Na has fallen since admission. No L stump edema/lungs are clear but concerned she has excess volume with HTN. Attempt to lower volume as tolerated in HD tomorrow. Lower  EDW on discharge post BKA Anemia of ESRD: Hgb 9.1 Last ESA given 01/23/21. Expect fall in HGB post op. Follow HGB. Transfuse if needed. Resume Fe load post BKA. Surprisingly over 10 this AM Metabolic bone disease: Continue binders- phoslo , VDRA- calcitriol  Nutrition: Albumin low. renal carb modified diet albumin today protein supplement with Nepro HIV-meds per primary Diabetes mellitus type 2-per primary Depressive disorder on sertraline per primary  Corliss Parish, MD 02/01/2021

## 2021-02-01 NOTE — Evaluation (Signed)
Physical Therapy Evaluation Patient Details Name: Kathy Frank MRN: [id number] DOB: 1975/09/21 Today's Date: 02/01/2021   History of Present Illness  45 y/o female presented to ED on 6/22 for R foot pain and difficulty weight bearing with concerns of gangrene. 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 PMH: anxiety, ESRD on HD, depression, HTN, HIV, type 2 DM, L AKA  Clinical Impression  PTA, patient lives with daughter and reports independence with use of RW and w/c. Patient has L prosthetic but does not use due to being "too heavy". Patient currently presents with generalized weakness, impaired sitting balance, decreased activity tolerance, and impaired functional mobility. Patient able to lateral scoot on EOB with supervision in preparation for lateral scoot or slideboard transfer to recliner/wheelchair. Patient will benefit from skilled PT services during acute stay to address listed deficits. Recommend CIR following discharge t optimize functional independence and safety prior to returning home.     Follow Up Recommendations CIR    Equipment Recommendations  Wheelchair (measurements PT);Wheelchair cushion (measurements PT);Other (comment) (slideboard, drop arm BSC)    Recommendations for Other Services       Precautions / Restrictions Precautions Precautions: Fall Precaution Comments: L AKA - has prosthetic but does not use ("too heavy") Required Braces or Orthoses: Other Brace Other Brace: limb protector Restrictions Weight Bearing Restrictions: Yes RLE Weight Bearing: Non weight bearing      Mobility  Bed Mobility Overal bed mobility: Modified Independent                  Transfers Overall transfer level: Needs assistance Equipment used: None Transfers: Lateral/Scoot Transfers          Lateral/Scoot Transfers: Supervision General transfer comment: supervision for lateral scoot on EOB. Patient deferred OOB transfer to chair this session due to fatigue from  HD  Ambulation/Gait                Stairs            Wheelchair Mobility    Modified Rankin (Stroke Patients Only)       Balance Overall balance assessment: Needs assistance Sitting-balance support: Bilateral upper extremity supported Sitting balance-Leahy Scale: Fair Sitting balance - Comments: close supervision for safety                                     Pertinent Vitals/Pain Pain Assessment: Faces Faces Pain Scale: Hurts even more Pain Location: R LE Pain Descriptors / Indicators: Discomfort;Grimacing;Guarding Pain Intervention(s): Monitored during session;Repositioned    Home Living Family/patient expects to be discharged to:: Private residence Living Arrangements: Children Available Help at Discharge: Family;Available PRN/intermittently Type of Home: House Home Access: Stairs to enter Entrance Stairs-Rails: Right;Left;Can reach both Entrance Stairs-Number of Steps: 4 Home Layout: One level Home Equipment: Shower seat;Wheelchair - [employer] - 2 wheels      Prior Function Level of Independence: Independent with assistive device(s)         Comments: uses RW for mobilizatoin and w/c     Hand Dominance        Extremity/Trunk Assessment   Upper Extremity Assessment Upper Extremity Assessment: Defer to OT evaluation    Lower Extremity Assessment Lower Extremity Assessment: Overall WFL for tasks assessed;RLE deficits/detail RLE: Unable to fully assess due to pain    Cervical / Trunk Assessment Cervical / Trunk Assessment: Normal  Communication   Communication: No difficulties  Cognition Arousal/Alertness: Awake/alert Behavior During  Therapy: WFL for tasks assessed/performed Overall Cognitive Status: Within Functional Limits for tasks assessed                                        General Comments      Exercises     Assessment/Plan    PT Assessment Patient needs continued PT services  PT  Problem List Decreased strength;Decreased range of motion;Decreased activity tolerance;Decreased balance;Decreased mobility;Decreased knowledge of use of DME       PT Treatment Interventions DME instruction;Functional mobility training;Therapeutic activities;Therapeutic exercise;Balance training;Patient/family education;Wheelchair mobility training    PT Goals (Current goals can be found in the Care Plan section)  Acute Rehab PT Goals Patient Stated Goal: to be independent PT Goal Formulation: With patient Time For Goal Achievement: 02/15/21 Potential to Achieve Goals: Good    Frequency Min 3X/week   Barriers to discharge        Co-evaluation               AM-PAC PT "6 Clicks" Mobility  Outcome Measure Help needed turning from your back to your side while in a flat bed without using bedrails?: None Help needed moving from lying on your back to sitting on the side of a flat bed without using bedrails?: None Help needed moving to and from a bed to a chair (including a wheelchair)?: A Little Help needed standing up from a chair using your arms (e.g., wheelchair or bedside chair)?: A Little Help needed to walk in hospital room?: A Little Help needed climbing 3-5 steps with a railing? : A Little 6 Click Score: 20    End of Session   Activity Tolerance: Patient tolerated treatment well Patient left: in bed;with call bell/phone within reach;with bed alarm set Nurse Communication: Mobility status PT Visit Diagnosis: Other abnormalities of gait and mobility (R26.89);Muscle weakness (generalized) (M62.81)    Time: [id number] PT Time Calculation (min) (ACUTE ONLY): 22 min   Charges:   PT Evaluation $PT Eval Moderate Complexity: 1 Mod          Ourania Hamler A. Gilford Rile PT, DPT Acute Rehabilitation Services Pager [phone] Office [phone]   Linna Hoff 02/01/2021, 2:55 PM

## 2021-02-01 NOTE — Progress Notes (Addendum)
   Subjective: Patient was seen this morning.  Patient states that she has been having a lot of pain since surgery.  Pain medication not helping much.  Pain medication helps for few hours and then pain comes back.  Patient has dialysis today.  Explained that we will adjust her pain medications. Objective: Vital signs in last 24 hours: Vitals:   01/31/21 1252 01/31/21 2327 02/01/21 0532 02/01/21 0551  BP: (!) 162/85 126/78 122/64 (!) 185/78  Pulse: 72 84 (!) 57 73  Resp: '16 18 18   '$ Temp: 98.4 F (36.9 C) 98.2 F (36.8 C)  98.1 F (36.7 C)  TempSrc: Oral Oral  Oral  SpO2: 98% 96% 94% 99%  Weight:      Height:         Physical Exam Constitutional: Patient lying in hospital bed, transferring to dialysis.  Not in acute distress.   Cardiovascular: Regular rate and rhythm, normal heart sounds. No Murmurs.   Pulmonary: Effort normal, breathing comfortably on room air. Abdominal: Flat, nondistended Musculoskeletal: Right leg amputated below knee in dressing.  Left AKA  Skin warm and dry  neurological: Alert and oriented, no focal deficits. Psychiatric: Anxious mood and affect.  Assessment/Plan: Principal Problem:   Gangrene of right foot (HCC) Active Problems:   Diabetes mellitus type 2 in obese ([place])   Human immunodeficiency virus (HIV) disease ([place])   ESRD (end stage renal disease) ([place])   Anemia due to chronic kidney disease   Peripheral arterial disease ([place])  Kathy Frank is a 45 year old female admitted for worsening right foot pain in the setting of gangrene secondary to severe PAD. S/p right BKA (01/31/21) S/p Rt BKA for gangrene by Dr. Chaya Jan (01/31/21) -Pain control with Oxycodone 5-10 mg PRN Q3h -Dilaudid 0.5 mg Q3H PRN for breakthrough pain. -Start Gabapentin 100 mg 3 times per week after HD on HD days only.  #ESRD, Tuesdays, Thursdays, Saturday -HD per nephrology  #Type 2 diabetes  -Continue CBGs, Lantus 5 units ,SSI for diabetes.  Dispo: Discharge in 1-2 days.    Armando Reichert, MD 02/01/2021, 6:01 AM Pager: [phone] After 5pm on weekdays and 1pm on weekends: Please call On Call pager [phone]

## 2021-02-01 NOTE — Procedures (Signed)
Patient was seen on dialysis and the procedure was supervised.  BFR 375  Via AVF BP is  133/72.   Patient appears to be tolerating treatment well  Louis Meckel 02/01/2021

## 2021-02-01 NOTE — Progress Notes (Signed)
Orthopedic Tech Progress Note Patient Details:  Kathy Frank [birth year] [id number] Ordered brace Patient ID: Kathy Frank, female   DOB: Jun 16, 1976, 45 y.o.   MRN: [id number]  [name] 02/01/2021, 7:04 AM

## 2021-02-01 NOTE — Evaluation (Signed)
Occupational Therapy Evaluation Patient Details Name: Kathy Frank MRN: [id number] DOB: [birth year] Today's Date: 02/01/2021    History of Present Illness 45 y/o female presented to ED on 6/22 for R foot pain and difficulty weight bearing with concerns of gangrene. 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 PMH: anxiety, ESRD on HD, depression, HTN, HIV, type 2 DM, L AKA   Clinical Impression   Pt admitted to [facility] for concerns and procedure listed above. PTA pt reported that she was independent with all ADL's and IADL's, using a RW and her wc. At the time of the evaluation, pt was somewhat lethargic due to dialysis, however, pt was able to complete lateral scoot transfers with supervision. Pt requiring set up for all UB ADL's and Min guard to min A for LB ADL's. Pt will benefit from Intensive therapies once medically stable to assist her in maximizing her independence and strength prior to receiving her prosthetic limb. While in hospital acute OT will continue to follow up to address all areas of concerns below.    Follow Up Recommendations  CIR    Equipment Recommendations  3 in 1 bedside commode (Drop arm)    Recommendations for Other Services Rehab consult     Precautions / Restrictions Precautions Precautions: Fall Precaution Comments: L AKA - has prosthetic but does not use ("too heavy") Required Braces or Orthoses: Other Brace Other Brace: limb protector Restrictions Weight Bearing Restrictions: Yes RLE Weight Bearing: Non weight bearing      Mobility Bed Mobility Overal bed mobility: Modified Independent                  Transfers Overall transfer level: Needs assistance Equipment used: None Transfers: Lateral/Scoot Transfers          Lateral/Scoot Transfers: Supervision General transfer comment: supervision for lateral scoot on EOB. Patient deferred OOB transfer to chair this session due to fatigue from HD    Balance Overall balance assessment: Needs  assistance Sitting-balance support: Bilateral upper extremity supported Sitting balance-Leahy Scale: Fair Sitting balance - Comments: close supervision for safety       Standing balance comment: Unable                           ADL either performed or assessed with clinical judgement   ADL Overall ADL's : Needs assistance/impaired Eating/Feeding: Set up;Sitting   Grooming: Set up;Sitting   Upper Body Bathing: Set up;Sitting   Lower Body Bathing: Min guard;Minimal assistance;Sitting/lateral leans   Upper Body Dressing : Set up;Sitting   Lower Body Dressing: Min guard;Minimal assistance;Sitting/lateral leans   Toilet Transfer: Minimal assistance;Moderate assistance;Transfer board;Requires drop arm   Toileting- Clothing Manipulation and Hygiene: Min guard;Minimal assistance   Tub/ Shower Transfer: Minimal assistance;Moderate assistance;Transfer board   Functional mobility during ADLs: Min guard;Minimal assistance (scooting) General ADL Comments: Pt is limited to seated ADL's at this time, all UB ADL's with set up, LB ADL's min guard - min A depending on balance and set up .     Vision Baseline Vision/History: No visual deficits Patient Visual Report: No change from baseline Vision Assessment?: No apparent visual deficits     Perception Perception Perception Tested?: No   Praxis Praxis Praxis tested?: Not tested    Pertinent Vitals/Pain Pain Assessment: Faces Faces Pain Scale: Hurts even more Pain Location: R LE Pain Descriptors / Indicators: Discomfort;Grimacing;Guarding Pain Intervention(s): Monitored during session;Repositioned     Hand Dominance Right   Extremity/Trunk Assessment Upper  Extremity Assessment Upper Extremity Assessment: Overall WFL for tasks assessed   Lower Extremity Assessment Lower Extremity Assessment: Defer to PT evaluation RLE: Unable to fully assess due to pain   Cervical / Trunk Assessment Cervical / Trunk Assessment:  Normal   Communication Communication Communication: No difficulties   Cognition Arousal/Alertness: Awake/alert Behavior During Therapy: WFL for tasks assessed/performed Overall Cognitive Status: Within Functional Limits for tasks assessed                                     General Comments  VSS on RA, pt very tired this session due to HD    Exercises     Shoulder Instructions      Home Living Family/patient expects to be discharged to:: Private residence Living Arrangements: Children Available Help at Discharge: Family;Available PRN/intermittently Type of Home: House Home Access: Stairs to enter [employer] of Steps: 4 Entrance Stairs-Rails: Right;Left;Can reach both Home Layout: One level     Bathroom Shower/Tub: Teacher, early years/pre: Standard Bathroom Accessibility: Yes How Accessible: Accessible via walker Home Equipment: Shower seat;Wheelchair - [employer] - 2 wheels          Prior Functioning/Environment Level of Independence: Independent with assistive device(s)        Comments: uses RW for mobilizatoin and w/c        OT Problem List: Decreased strength;Decreased range of motion;Decreased activity tolerance;Impaired balance (sitting and/or standing);Decreased safety awareness;Decreased knowledge of use of DME or AE;Decreased knowledge of precautions;Cardiopulmonary status limiting activity;Pain      OT Treatment/Interventions: Self-care/ADL training;Therapeutic exercise;Energy conservation;DME and/or AE instruction;Therapeutic activities;Patient/family education;Balance training    OT Goals(Current goals can be found in the care plan section) Acute Rehab OT Goals Patient Stated Goal: to be independent OT Goal Formulation: With patient Time For Goal Achievement: 02/15/21 Potential to Achieve Goals: Good ADL Goals Pt Will Transfer to Toilet: with modified independence;with transfer board Pt Will Perform  Tub/Shower Transfer: with modified independence;with transfer board Pt/caregiver will Perform Home Exercise Program: Increased strength;Both right and left upper extremity;With theraband;With theraputty;Independently;With written HEP provided  OT Frequency: Min 2X/week   Barriers to D/C:            Co-evaluation              AM-PAC OT "6 Clicks" Daily Activity     Outcome Measure Help from another person eating meals?: None Help from another person taking care of personal grooming?: A Little Help from another person toileting, which includes using toliet, bedpan, or urinal?: A Little Help from another person bathing (including washing, rinsing, drying)?: A Little Help from another person to put on and taking off regular upper body clothing?: A Little Help from another person to put on and taking off regular lower body clothing?: A Little 6 Click Score: 19   End of Session Nurse Communication: Mobility status  Activity Tolerance: Patient tolerated treatment well Patient left: in bed;with call bell/phone within reach;with bed alarm set  OT Visit Diagnosis: Unsteadiness on feet (R26.81);Other abnormalities of gait and mobility (R26.89);Muscle weakness (generalized) (M62.81)                Time: [id number] OT Time Calculation (min): 23 min Charges:  OT General Charges $OT Visit: 1 Visit OT Evaluation $OT Eval Moderate Complexity: 1 Mod OT Treatments $Self Care/Home Management : 8-22 mins  Philander Ake H., OTR/L Acute Rehabilitation  Doryce Mcgregory Elane Yolanda Bonine  02/01/2021, 5:19 PM

## 2021-02-01 NOTE — Progress Notes (Addendum)
    Patient evaluated on hd. Limb protector in place and will leave. [facility] to eval as inpatient. Will follow.   Servando Snare, MD

## 2021-02-02 ENCOUNTER — Encounter (HOSPITAL_COMMUNITY): Payer: Self-pay | Admitting: Surgery

## 2021-02-02 LAB — CBC
HCT: 29.7 % — ABNORMAL LOW (ref 36.0–46.0)
Hemoglobin: 9.3 g/dL — ABNORMAL LOW (ref 12.0–15.0)
MCH: 30.1 pg (ref 26.0–34.0)
MCHC: 31.3 g/dL (ref 30.0–36.0)
MCV: 96.1 fL (ref 80.0–100.0)
Platelets: 321 10*3/uL (ref 150–400)
RBC: 3.09 MIL/uL — ABNORMAL LOW (ref 3.87–5.11)
RDW: 16.2 % — ABNORMAL HIGH (ref 11.5–15.5)
WBC: 8.9 10*3/uL (ref 4.0–10.5)
nRBC: 0 % (ref 0.0–0.2)

## 2021-02-02 LAB — CULTURE, BLOOD (ROUTINE X 2)
Culture: NO GROWTH
Special Requests: ADEQUATE

## 2021-02-02 LAB — GLUCOSE, CAPILLARY
Glucose-Capillary: 159 mg/dL — ABNORMAL HIGH (ref 70–99)
Glucose-Capillary: 130 mg/dL — ABNORMAL HIGH (ref 70–99)
Glucose-Capillary: 130 mg/dL — ABNORMAL HIGH (ref 70–99)
Glucose-Capillary: 125 mg/dL — ABNORMAL HIGH (ref 70–99)

## 2021-02-02 LAB — RENAL FUNCTION PANEL
Albumin: 2.7 g/dL — ABNORMAL LOW (ref 3.5–5.0)
Anion gap: 13 (ref 5–15)
BUN: 16 mg/dL (ref 6–20)
CO2: 27 mmol/L (ref 22–32)
Calcium: 9.4 mg/dL (ref 8.9–10.3)
Chloride: 92 mmol/L — ABNORMAL LOW (ref 98–111)
Creatinine, Ser: 5.37 mg/dL — ABNORMAL HIGH (ref 0.44–1.00)
GFR, Estimated: 9 mL/min — ABNORMAL LOW (ref >60)
Glucose, Bld: 135 mg/dL — ABNORMAL HIGH (ref 70–99)
Phosphorus: 4 mg/dL (ref 2.5–4.6)
Potassium: 4.4 mmol/L (ref 3.5–5.1)
Sodium: 132 mmol/L — ABNORMAL LOW (ref 135–145)

## 2021-02-02 MED ORDER — PROSOURCE PLUS PO LIQD
30.0000 mL | Freq: Two times a day (BID) | ORAL | Status: DC
  Administered 2021-02-02 – 2021-02-08 (×13): 30 mL via ORAL
  Filled 2021-02-02 (×13): qty 30

## 2021-02-02 MED ORDER — OXYCODONE HCL 5 MG PO TABS
10.0000 mg | ORAL_TABLET | ORAL | Status: DC
  Administered 2021-02-02 – 2021-02-06 (×24): 10 mg via ORAL
  Filled 2021-02-02 (×24): qty 2

## 2021-02-02 MED ORDER — CALCITRIOL 0.5 MCG PO CAPS
1.0000 ug | ORAL_CAPSULE | Freq: Every day | ORAL | Status: DC
  Administered 2021-02-02 – 2021-02-08 (×7): 1 ug via ORAL
  Filled 2021-02-02 (×7): qty 2

## 2021-02-02 NOTE — Progress Notes (Signed)
[facility] Progress Note   Subjective:     Objective Vitals:   02/01/21 1358 02/01/21 2226 02/02/21 0520 02/02/21 0619  BP: 135/71 (!) 183/103 (!) 165/94 135/62  Pulse: 75 83 82 82  Resp: '18 16 16   '$ Temp: 98.5 F (36.9 C) 99.1 F (37.3 C) 98.7 F (37.1 C)   TempSrc: Oral Oral Oral   SpO2: 94% 100% 100%   Weight:      Height:       Physical Exam General: Pleasant chronically ill appearing female in NAD Heart: S1,S2 RRR No M/R/G Lungs: CTAB. No WOB Abdomen:S, NT Extremities:L AKA no stump edema. R BKA with stump protector in place. Dialysis Access: L AVF +T/B   Additional Objective Labs: Basic Metabolic Panel: Recent Labs  Lab 01/31/21 0239 02/01/21 0042 02/02/21 0107  NA 131* 133* 132*  K 3.8 4.2 4.4  CL 92* 90* 92*  CO2 '26 26 27  '$ GLUCOSE 109* 283* 135*  BUN 20 40* 16  CREATININE 5.52* 8.31* 5.37*  CALCIUM 8.5* 9.1 9.4  PHOS 4.4  --  4.0   Liver Function Tests: Recent Labs  Lab 01/28/21 1803 01/29/21 1607 01/31/21 0239 02/02/21 0107  AST 14* 11*  --   --   ALT 9 9  --   --   ALKPHOS 54 54  --   --   BILITOT 0.7 0.8  --   --   PROT 8.4* 7.7  --   --   ALBUMIN 2.9* 2.8* 2.7* 2.7*   No results for input(s): LIPASE, AMYLASE in the last 168 hours. CBC: Recent Labs  Lab 01/28/21 1803 01/29/21 1607 01/30/21 0105 01/31/21 0239 02/01/21 0042 02/02/21 0107  WBC 6.4 6.4 7.2 6.0 6.3 8.9  NEUTROABS 4.8  --   --   --   --   --   HGB 9.0* 9.5* 9.1* 9.4* 10.1* 9.3*  HCT 27.8* 29.8* 28.0* 29.1* 32.3* 29.7*  MCV 93.6 96.4 94.0 94.5 95.3 96.1  PLT 375 349 349 339 352 321   Blood Culture    Component Value Date/Time   SDES BLOOD RIGHT ANTECUBITAL 01/28/2021 1812   SPECREQUEST  01/28/2021 1812    BOTTLES DRAWN AEROBIC AND ANAEROBIC Blood Culture adequate volume   CULT  01/28/2021 1812    NO GROWTH 4 DAYS Performed at [facility], [place] [address] [place], [place] [address]    REPTSTATUS PENDING 01/28/2021 1812    Cardiac  Enzymes: No results for input(s): CKTOTAL, CKMB, CKMBINDEX, TROPONINI in the last 168 hours. CBG: Recent Labs  Lab 02/01/21 1354 02/01/21 1658 02/01/21 2201 02/01/21 2219 02/02/21 0736  GLUCAP 157* 161* 118* 118* 159*   Iron Studies: No results for input(s): IRON, TIBC, TRANSFERRIN, FERRITIN in the last 72 hours. '@[email or web address]'$ @ Studies/Results: No results found. Medications:  sodium chloride     iron sucrose Stopped (02/01/21 1149)   magnesium sulfate bolus IVPB      aspirin EC  81 mg Oral Daily   atorvastatin  40 mg Oral Daily   bictegravir-emtricitabine-tenofovir AF  1 tablet Oral Daily   calcitRIOL  1.25 mcg Oral Daily   calcium acetate  1,334 mg Oral TID WC   Chlorhexidine Gluconate Cloth  6 each Topical Daily   [START ON 02/06/2021] darbepoetin (ARANESP) injection - DIALYSIS  100 mcg Intravenous Q Thu-HD   docusate sodium  100 mg Oral Daily   feeding supplement (NEPRO CARB STEADY)  237 mL Oral BID BM   gabapentin  100  mg Oral Once per day on Tue Thu Sat   heparin  5,000 Units Subcutaneous Q8H   insulin aspart  0-15 Units Subcutaneous TID WC   insulin glargine  5 Units Subcutaneous QHS   multivitamin  1 tablet Oral QHS   pantoprazole  40 mg Oral Daily   senna  1 tablet Oral BID   sertraline  50 mg Oral QHS   sodium chloride flush  3 mL Intravenous Q12H     Dialysis Orders: Center: [facility] on TTS. 4 hrs 75 kg 180NRe 400/Autoflow 1.5 2.0 k/ 2.0 Ca AVF -Heparin 3000 units IV TIW -Calcitriol 1.25 mcg p.o. q. dialysis -Mircera 100 MCG IV every 2 weeks last given 01/23/2021 HD -Venofer 100 mg x 5 doses (1/5 doses given 01/28/21)     Assessment/Plan Right foot gangrene:  S/P R BKA 01/31/21. Per [facility]. ESRD: T,Th,S Next HD 02/04/21. Use 3.0 K bath, no heparin Hypertension/volume: BP better control but she is quite emotional and tends to go up when she's upset. No antihypertensive meds on home med list-apparently HTN is volume controlled. Na has fallen since admission,  attempted to lower volume with HD 06/25/202 net UF slightly above 2 liters. Continue to lower EDW as needed.  Anemia of ESRD: Hgb 9.3 Last ESA given 01/23/21. Expected fall in HGB post op but so far stable. Follow HGB. Transfuse if needed. Resume Fe load post BKA.  Metabolic bone disease: PO4 at goal, C Ca >10. Continue binders- phoslo , decrease VDRA- calcitriol. Nutrition: Albumin low. renal carb modified diet albumin today protein supplement with prosource. HIV-meds per primary Diabetes mellitus type 2-per primary Depressive disorder on sertraline per primary  Caragh Gasper H. Jaivyn Gulla NP-C 02/02/2021, 8:18 AM  [employer] [phone]

## 2021-02-02 NOTE — Progress Notes (Signed)
   Subjective: Patient was seen this morning.  She states that she still has a lot of pain. She states the oral pain medication is not 100% effective but the IV pain medication relieves the pain better.  States the IV pain medication relieves pain only for a shorter duration.  Discussed that we will adjust her pain medication for better control of pain.  Explained that the PT recommended inpatient rehab.  Patient agrees with the plan.  Objective: Vital signs in last 24 hours: Vitals:   02/01/21 1358 02/01/21 2226 02/02/21 0520 02/02/21 0619  BP: 135/71 (!) 183/103 (!) 165/94 135/62  Pulse: 75 83 82 82  Resp: '18 16 16   '$ Temp: 98.5 F (36.9 C) 99.1 F (37.3 C) 98.7 F (37.1 C)   TempSrc: Oral Oral Oral   SpO2: 94% 100% 100%   Weight:      Height:          Physical Exam Constitutional: Patient lying in bed.  In acute distress due to pain. Cardiovascular: Regular rate and rhythm, normal heart sounds.  No murmurs Pulmonary: Well, breathing comfortably at room air.  Abdominal: Flat and nondistended Musculoskeletal: Right leg amputated below knee in dressing.  Left AKA Skin warm and dry. neurological: Alert and oriented, no focal deficits.   Psychiatric: Anxious mood and affect. Assessment/Plan: Principal Problem:   Gangrene of right foot (HCC) Active Problems:   Diabetes mellitus type 2 in obese ([place])   Human immunodeficiency virus (HIV) disease ([place])   ESRD (end stage renal disease) ([place])   Anemia due to chronic kidney disease   Peripheral arterial disease ([place])  Kathy Frank is a 45 year old female admitted for worsening right foot pain in the setting of gangrene secondary to severe PAD. S/p right BKA (01/31/21) S/p Rt BKA for gangrene by Dr. Chaya Jan (01/31/21) Patient still complains of a lot of pain. -Increase pain control with Oxycodone to 10 PRN Q4h -Continue Dilaudid 0.5 mg Q3H PRN for breakthrough pain. -Continue gabapentin 100 mg 3 times per week after hemodialysis on HD  days only.  #ESRD, Tuesdays, Thursdays, Saturday -HD per nephrology  #Type 2 diabetes  -Continue CBGs, Lantus 5 units ,SSI for diabetes. PT/OT-recommends CIR Dispo: Discharge in 1-2 days.   Armando Reichert, MD 02/02/2021, 6:33 AM Pager: [phone] After 5pm on weekdays and 1pm on weekends: Please call On Call pager [phone]

## 2021-02-02 NOTE — Progress Notes (Addendum)
   Left BKA limb protector and black shinker sock in place Pain issues working on pain control Lungs non labored breathing tolerating Po intake  S/P right BKA Maintain dressing [facility] consulted for stump care Stable disposition  Roxy Horseman PA-C  I have independently interviewed and examined patient and agree with PA assessment and plan above. [facility] to see prior to dc and will need tighter shrinker.   Endre Coutts C. Donzetta Matters, MD [facility] Office: [phone] Pager: [phone]

## 2021-02-02 NOTE — Progress Notes (Signed)
Inpatient Rehab Admissions Coordinator:   CIR consult received. I await another PT note, as Pt. Was mod I-supervision with all bed mobility and transfers attempted, but Pt. Fatigued quickly and did not attempt many transfers. If Pt. Demonstrates necessity for CIR, I will consider her for admission. Of note. [facility] Medicare typically will not approve CIR for amputations, but may give approval on admission on appeal. The appeal process will likely extend to the end of this upcoming week.   Clemens Catholic, [place], [place] Admissions Coordinator  [phone] ([place]) [phone] (office)

## 2021-02-03 LAB — RENAL FUNCTION PANEL
Albumin: 2.4 g/dL — ABNORMAL LOW (ref 3.5–5.0)
Anion gap: 13 (ref 5–15)
BUN: 32 mg/dL — ABNORMAL HIGH (ref 6–20)
CO2: 28 mmol/L (ref 22–32)
Calcium: 9.9 mg/dL (ref 8.9–10.3)
Chloride: 90 mmol/L — ABNORMAL LOW (ref 98–111)
Creatinine, Ser: 8.3 mg/dL — ABNORMAL HIGH (ref 0.44–1.00)
GFR, Estimated: 6 mL/min — ABNORMAL LOW (ref >60)
Glucose, Bld: 159 mg/dL — ABNORMAL HIGH (ref 70–99)
Phosphorus: 5.9 mg/dL — ABNORMAL HIGH (ref 2.5–4.6)
Potassium: 4.8 mmol/L (ref 3.5–5.1)
Sodium: 131 mmol/L — ABNORMAL LOW (ref 135–145)

## 2021-02-03 LAB — CBC
HCT: 28 % — ABNORMAL LOW (ref 36.0–46.0)
Hemoglobin: 9 g/dL — ABNORMAL LOW (ref 12.0–15.0)
MCH: 30.6 pg (ref 26.0–34.0)
MCHC: 32.1 g/dL (ref 30.0–36.0)
MCV: 95.2 fL (ref 80.0–100.0)
Platelets: 324 10*3/uL (ref 150–400)
RBC: 2.94 MIL/uL — ABNORMAL LOW (ref 3.87–5.11)
RDW: 16 % — ABNORMAL HIGH (ref 11.5–15.5)
WBC: 9.5 10*3/uL (ref 4.0–10.5)
nRBC: 0 % (ref 0.0–0.2)

## 2021-02-03 LAB — GLUCOSE, CAPILLARY
Glucose-Capillary: 160 mg/dL — ABNORMAL HIGH (ref 70–99)
Glucose-Capillary: 143 mg/dL — ABNORMAL HIGH (ref 70–99)
Glucose-Capillary: 175 mg/dL — ABNORMAL HIGH (ref 70–99)
Glucose-Capillary: 219 mg/dL — ABNORMAL HIGH (ref 70–99)

## 2021-02-03 LAB — SURGICAL PATHOLOGY

## 2021-02-03 MED ORDER — POLYETHYLENE GLYCOL 3350 17 G PO PACK
17.0000 g | PACK | Freq: Every day | ORAL | Status: DC
  Administered 2021-02-04: 17 g via ORAL
  Filled 2021-02-03 (×3): qty 1

## 2021-02-03 MED ORDER — SODIUM CHLORIDE 0.9 % IV SOLN: 100.0000 mL | INTRAVENOUS | Status: DC | PRN

## 2021-02-03 NOTE — Progress Notes (Addendum)
[facility] Progress Note   Subjective:     Patient seen and examined at bedside. Reports feeling better. Denies pain, SOB, ABD pain, and N/V/D. She reports being denied for inpatient rehab-plan for SNF to receive rehab there. Also, she is inquiring about ordering home oxygen and scooter. Says she uses O2 while receiving HD-TTS. Tolerated HD 6/25 with net UF 2L. Plan for HD 02/04/21.  Objective Vitals:   02/02/21 0619 02/02/21 1327 02/02/21 2127 02/03/21 0437  BP: 135/62 (!) 166/70 (!) 145/72 (!) 160/63  Pulse: 82 85 79 84  Resp:  '17 18 18  '$ Temp:  99.6 F (37.6 C) 98.3 F (36.8 C) 98.4 F (36.9 C)  TempSrc:  Oral Oral Oral  SpO2:  100% 98% 98%  Weight:      Height:       Physical Exam General: Chronically ill-appearing; NAD Heart: Normal S1 and S2; No murmurs, gallops, or rubs Lungs: Clear throughout; No wheezing, rhonchi, or rales Abdomen: Soft and non-tender Extremities: No edema BLLE Dialysis Access: L AVF (+) Bruit/Thrill   Filed Weights   02/01/21 0551 02/01/21 0833 02/01/21 1219  Weight: 80.1 kg 75.7 kg 73.5 kg    Intake/Output Summary (Last 24 hours) at 02/03/2021 0941 Last data filed at 02/03/2021 0457 Gross per 24 hour  Intake 797 ml  Output 100 ml  Net 697 ml    Additional Objective Labs: Basic Metabolic Panel: Recent Labs  Lab 01/31/21 0239 02/01/21 0042 02/02/21 0107 02/03/21 0049  NA 131* 133* 132* 131*  K 3.8 4.2 4.4 4.8  CL 92* 90* 92* 90*  CO2 '26 26 27 28  '$ GLUCOSE 109* 283* 135* 159*  BUN 20 40* 16 32*  CREATININE 5.52* 8.31* 5.37* 8.30*  CALCIUM 8.5* 9.1 9.4 9.9  PHOS 4.4  --  4.0 5.9*   Liver Function Tests: Recent Labs  Lab 01/28/21 1803 01/29/21 1607 01/31/21 0239 02/02/21 0107 02/03/21 0049  AST 14* 11*  --   --   --   ALT 9 9  --   --   --   ALKPHOS 54 54  --   --   --   BILITOT 0.7 0.8  --   --   --   PROT 8.4* 7.7  --   --   --   ALBUMIN 2.9* 2.8* 2.7* 2.7* 2.4*   No results for input(s): LIPASE,  AMYLASE in the last 168 hours. CBC: Recent Labs  Lab 01/28/21 1803 01/29/21 1607 01/30/21 0105 01/31/21 0239 02/01/21 0042 02/02/21 0107 02/03/21 0049  WBC 6.4   < > 7.2 6.0 6.3 8.9 9.5  NEUTROABS 4.8  --   --   --   --   --   --   HGB 9.0*   < > 9.1* 9.4* 10.1* 9.3* 9.0*  HCT 27.8*   < > 28.0* 29.1* 32.3* 29.7* 28.0*  MCV 93.6   < > 94.0 94.5 95.3 96.1 95.2  PLT 375   < > 349 339 352 321 324   < > = values in this interval not displayed.   Blood Culture    Component Value Date/Time   SDES BLOOD RIGHT ANTECUBITAL 01/28/2021 1812   SPECREQUEST  01/28/2021 1812    BOTTLES DRAWN AEROBIC AND ANAEROBIC Blood Culture adequate volume   CULT  01/28/2021 1812    NO GROWTH 5 DAYS Performed at [facility], [place] [address], [place], [place] [address]    REPTSTATUS 02/02/2021 FINAL 01/28/2021 1812  Cardiac Enzymes: No results for input(s): CKTOTAL, CKMB, CKMBINDEX, TROPONINI in the last 168 hours. CBG: Recent Labs  Lab 02/02/21 0736 02/02/21 1214 02/02/21 1642 02/02/21 2002 02/03/21 0720  GLUCAP 159* 130* 130* 125* 160*   Iron Studies: No results for input(s): IRON, TIBC, TRANSFERRIN, FERRITIN in the last 72 hours. Lab Results  Component Value Date   INR 1.3 (H) 01/28/2021   INR 1.1 05/03/2019   INR 1.2 02/06/2019   Studies/Results: No results found.  Medications:  sodium chloride     iron sucrose Stopped (02/01/21 1149)   magnesium sulfate bolus IVPB      (feeding supplement) PROSource Plus  30 mL Oral BID BM   aspirin EC  81 mg Oral Daily   atorvastatin  40 mg Oral Daily   bictegravir-emtricitabine-tenofovir AF  1 tablet Oral Daily   calcitRIOL  1 mcg Oral Daily   calcium acetate  1,334 mg Oral TID WC   Chlorhexidine Gluconate Cloth  6 each Topical Daily   [START ON 02/06/2021] darbepoetin (ARANESP) injection - DIALYSIS  100 mcg Intravenous Q Thu-HD   docusate sodium  100 mg Oral Daily   feeding supplement (NEPRO CARB STEADY)  237 mL Oral BID BM    gabapentin  100 mg Oral Once per day on Tue Thu Sat   heparin  5,000 Units Subcutaneous Q8H   insulin aspart  0-15 Units Subcutaneous TID WC   insulin glargine  5 Units Subcutaneous QHS   multivitamin  1 tablet Oral QHS   oxyCODONE  10 mg Oral Q4H   pantoprazole  40 mg Oral Daily   [START ON 02/04/2021] polyethylene glycol  17 g Oral Daily   senna  1 tablet Oral BID   sertraline  50 mg Oral QHS   sodium chloride flush  3 mL Intravenous Q12H    Dialysis Orders: [facility]-TTS 4 hrs 75 kg 180NRe 400/Autoflow 1.5 2.0 k/ 2.0 Ca AVF -Heparin 3000 units IV TIW -Calcitriol 1.25 mcg p.o. q. 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  ESRD: T,Th,S Next HD 02/04/21. Use 3.0 K bath, no heparin Hypertension/volume: BP better control but she is quite emotional and tends to go up when she's upset. No antihypertensive meds on home med list-apparently HTN is volume controlled. Na has fallen since admission, attempted to lower volume with HD 06/25/202 net UF slightly above 2 liters. Continue to lower EDW as needed.  Anemia of ESRD: Hgb 9.0 Last ESA given 01/23/21. Expected fall in HGB post op but so far stable. Follow HGB. Transfuse if needed. Resume Fe load post BKA.  Metabolic bone disease: PO4 at goal, C Ca >10. Continue binders- phoslo , decrease VDRA- calcitriol. Nutrition: Albumin low. renal carb modified diet albumin today protein supplement with prosource. HIV-meds per primary Diabetes mellitus type 2-per primary Depressive disorder on sertraline per primary  Tobie Poet, NP [facility] 02/03/2021,9:41 AM  LOS: 5 days

## 2021-02-03 NOTE — Progress Notes (Addendum)
Physical Therapy Treatment Patient Details Name: Kathy Frank MRN: [id number] DOB: January 17, 1976 Today's Date: 02/03/2021    History of Present Illness 45 y/o female presented to ED on 6/22 for R foot pain and difficulty weight bearing with concerns of gangrene. 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 PMH: anxiety, ESRD on HD, depression, HTN, HIV, type 2 DM, L AKA    PT Comments    Pt and PT discussed her options with tx and to go home, as she has stairs to get into her own home.  Pt is assisted to chair with minor help and repositioned, and is requiring only min guard to get there.  The transfer is at an ideal level being lined up for the bed and chair, and pt will need to be more independent with a wheelchair.  Will continue to work on her sitting balance, work on her ability to reach off her base of support in a chair and strengthen to get fitted later for BK prosthesis.  Pt is working on the steps to get fitted for a BK prosthesis eventually.  CIR requested to get pt able to roll a chair up a ramp and self transfer with a wheelchair.  Follow Up Recommendations  CIR     Equipment Recommendations  Wheelchair (measurements PT);Wheelchair cushion (measurements PT);Other (comment)    Recommendations for Other Services       Precautions / Restrictions Precautions Precautions: Fall Precaution Comments: L AKA - has prosthetic but does not use ("too heavy") Required Braces or Orthoses: Other Brace Other Brace: R limb protector Restrictions Weight Bearing Restrictions: Yes RLE Weight Bearing: Non weight bearing    Mobility  Bed Mobility Overal bed mobility: Modified Independent                  Transfers Overall transfer level: Needs assistance Equipment used:  (drop arm recliner) Transfers: Lateral/Scoot Transfers          Lateral/Scoot Transfers: Min guard General transfer comment: min guard for slding to chair  Ambulation/Gait             General Gait Details:  unable   Stairs             Wheelchair Mobility    Modified Rankin (Stroke Patients Only)       Balance Overall balance assessment: Needs assistance Sitting-balance support: No upper extremity supported Sitting balance-Leahy Scale: Fair Sitting balance - Comments: fair once up to side                                    Cognition Arousal/Alertness: Awake/alert Behavior During Therapy: WFL for tasks assessed/performed Overall Cognitive Status: Within Functional Limits for tasks assessed                                        Exercises      General Comments General comments (skin integrity, edema, etc.): talked with pt about her use of UE's and the safe options for home.  Has mother next door with ramped then level home, could go home wiht her since her home is having black mold reconstruction soon      Pertinent Vitals/Pain Pain Assessment: Faces Faces Pain Scale: No hurt Pain Intervention(s): Monitored during session;Repositioned    Home Living  Prior Function            PT Goals (current goals can now be found in the care plan section) Acute Rehab PT Goals Patient Stated Goal: get home maybe to her mother's Progress towards PT goals: Progressing toward goals    Frequency    Min 3X/week      PT Plan Current plan remains appropriate    Co-evaluation              AM-PAC PT "6 Clicks" Mobility   Outcome Measure  Help needed turning from your back to your side while in a flat bed without using bedrails?: None Help needed moving from lying on your back to sitting on the side of a flat bed without using bedrails?: A Little Help needed moving to and from a bed to a chair (including a wheelchair)?: A Little Help needed standing up from a chair using your arms (e.g., wheelchair or bedside chair)?: Total Help needed to walk in hospital room?: Total Help needed climbing 3-5 steps with a  railing? : Total 6 Click Score: 13    End of Session Equipment Utilized During Treatment: Gait belt;Other (comment) (R leg protective splint) Activity Tolerance: Treatment limited secondary to medical complications (Comment) Patient left: in chair;with call bell/phone within reach;with chair alarm set Nurse Communication: Mobility status PT Visit Diagnosis: Other abnormalities of gait and mobility (R26.89);Muscle weakness (generalized) (M62.81)     Time: [id number] PT Time Calculation (min) (ACUTE ONLY): 27 min  Charges:  $Therapeutic Activity: 23-37 mins                   Ramond Dial 02/03/2021, 5:11 PM  Mee Hives, PT MS Acute Rehab Dept. Number: [place] and [employer]

## 2021-02-03 NOTE — Progress Notes (Signed)
   02/03/21 0500  Provider Notification  Provider Name/Title Alfonse Spruce, MD  Date Provider Notified 02/03/21  Time Provider Notified 0500  Notification Type Page  Notification Reason Other (Comment) (no bowel movement since 01/28/21)  Provider response Other (Comment) (give PRN Miralax)  Date of Provider Response 02/03/21  Time of Provider Response 0515   No BM since 01/28/21. Bowel sounds present, abdomen round and soft. Denies pain or discomfort. Miralax given. Pending results.

## 2021-02-03 NOTE — Progress Notes (Signed)
Occupational Therapy Treatment Patient Details Name: Kathy Frank MRN: [id number] DOB: February 16, 1976 Today's Date: 02/03/2021    History of present illness 45 y/o female presented to ED on 6/22 for R foot pain and difficulty weight bearing with concerns of gangrene. 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 PMH: anxiety, ESRD on HD, depression, HTN, HIV, type 2 DM, L AKA   OT comments  [name] is progressing well with hopes to go to CIR at d/c to gain indep and function in all aspects of her care. Pt reported that her wc does not fit into the bathroom at her mothers house. Discussed compensatory techniques for pt to complete hygiene at the sink safety. Pt completed lateral scoot transfer from chair>bed with supervision, no LOB. She continues to benefit from continued OT acutely to progress modified indep. Recommend d/c CIR.   Follow Up Recommendations  CIR    Equipment Recommendations  3 in 1 bedside commode;Wheelchair (measurements OT) (drop arm WC)       Precautions / Restrictions Precautions Precautions: Fall Precaution Comments: L AKA - has prosthetic but does not use ("too heavy") Required Braces or Orthoses: Other Brace Other Brace: R limb protector Restrictions Weight Bearing Restrictions: Yes RLE Weight Bearing: Non weight bearing       Mobility Bed Mobility Overal bed mobility: Modified Independent                  Transfers Overall transfer level: Needs assistance Equipment used: None (drop arm recliner) Transfers: Lateral/Scoot Transfers          Lateral/Scoot Transfers: Supervision General transfer comment: supervision for safety, lateral scoot transfer from drop arm chair to bed - pt had to go to a higher surface this transfer and completed it well wtih no LOB    Balance Overall balance assessment: Needs assistance Sitting-balance support: No upper extremity supported Sitting balance-Leahy Scale: Fair Sitting balance - Comments: pt with fiar balance while sitting in  the chair, without back support during functional task wtih BUE               ADL either performed or assessed with clinical judgement   ADL Overall ADL's : Needs assistance/impaired       Toilet Transfer: Min guard;BSC;Requires drop arm Toilet Transfer Details (indicate cue type and reason): simulated - pt with great ability to lateral scoot between surfaces          Cognition Arousal/Alertness: Awake/alert Behavior During Therapy: WFL for tasks assessed/performed Overall Cognitive Status: Within Functional Limits for tasks assessed                      General Comments no new concerns - pt reported that she really wants to go to CIR at d/c    Pertinent Vitals/ Pain       Pain Assessment: Faces Faces Pain Scale: No hurt Pain Intervention(s): Monitored during session   Frequency  Min 2X/week        Progress Toward Goals  OT Goals(current goals can now be found in the care plan section)  Progress towards OT goals: Progressing toward goals  Acute Rehab OT Goals Patient Stated Goal: to be independent OT Goal Formulation: With patient Time For Goal Achievement: 02/15/21 Potential to Achieve Goals: Good ADL Goals Pt Will Transfer to Toilet: with modified independence;with transfer board Pt Will Perform Tub/Shower Transfer: with modified independence;with transfer board Pt/caregiver will Perform Home Exercise Program: Increased strength;Both right and left upper extremity;With theraband;With theraputty;Independently;With written HEP  provided  Plan Discharge plan remains appropriate       AM-PAC OT "6 Clicks" Daily Activity     Outcome Measure   Help from another person eating meals?: None Help from another person taking care of personal grooming?: A Little Help from another person toileting, which includes using toliet, bedpan, or urinal?: A Little Help from another person bathing (including washing, rinsing, drying)?: A Little Help from another  person to put on and taking off regular upper body clothing?: A Little Help from another person to put on and taking off regular lower body clothing?: A Little 6 Click Score: 19    End of Session Equipment Utilized During Treatment: Other (comment) (drop arm recliner)  OT Visit Diagnosis: Unsteadiness on feet (R26.81);Other abnormalities of gait and mobility (R26.89);Muscle weakness (generalized) (M62.81)   Activity Tolerance Patient tolerated treatment well   Patient Left in bed;with call bell/phone within reach;with bed alarm set   Nurse Communication Mobility status        Time: 1322-1340 OT Time Calculation (min): 18 min  Charges: OT General Charges $OT Visit: 1 Visit OT Treatments $Therapeutic Activity: 8-22 mins    Jamire Shabazz A Georgette Helmer 02/03/2021, 2:14 PM

## 2021-02-03 NOTE — Care Management Important Message (Signed)
Important Message  Patient Details  Name: Kathy Frank MRN: [id number] Date of Birth: 1975-10-09   Medicare Important Message Given:  Yes     Ko Bardon Montine Circle 02/03/2021, 4:13 PM

## 2021-02-03 NOTE — Progress Notes (Signed)
   02/03/21 1525  Vitals  Temp 99.1 F (37.3 C)  Temp Source Oral  BP (!) 170/77  MAP (mmHg) 105  BP Location Right Arm  BP Method Automatic  Patient Position (if appropriate) Lying  Pulse Rate 81  Pulse Rate Source Monitor  Resp 18  MEWS COLOR  MEWS Score Color Green  Oxygen Therapy  SpO2 97 %  O2 Device Room Air  MEWS Score  MEWS Temp 0  MEWS Systolic 0  MEWS Pulse 0  MEWS RR 0  MEWS LOC 0  MEWS Score 0  Provider Notification  Provider Name/Title Doda, MD  Date Provider Notified 02/03/21  Time Provider Notified 1537  Notification Type Page  Notification Reason Critical result  Test performed and critical result Blood pressure 170/77  Date Critical Result Received 02/03/21  Time Critical Result Received 1525  Provider response No new orders  Date of Provider Response 02/03/21  Time of Provider Response 1538  Note  Observations Patient resting comfortably in bed. BP 174/80 at 1425. '10mg'$  of Labatolol given at 1445 per standing order. MD notified. Will recheck BP at 1640 per MD Doda.

## 2021-02-03 NOTE — Progress Notes (Signed)
    Subjective  - POD #3, s/p right BKA  Pain relatively well controlled   Physical Exam:  Limb guard in place       Assessment/Plan:  POD #3  Plan for [facility] to change dressing and place tighter stump shrinker OK to goto rehab when bed available  Wells Natale Barba 02/03/2021 7:43 AM --  Vitals:   02/02/21 2127 02/03/21 0437  BP: (!) 145/72 (!) 160/63  Pulse: 79 84  Resp: 18 18  Temp: 98.3 F (36.8 C) 98.4 F (36.9 C)  SpO2: 98% 98%    Intake/Output Summary (Last 24 hours) at 02/03/2021 0743 Last data filed at 02/03/2021 0457 Gross per 24 hour  Intake 977 ml  Output 100 ml  Net 877 ml     Laboratory CBC    Component Value Date/Time   WBC 9.5 02/03/2021 0049   HGB 9.0 (L) 02/03/2021 0049   HGB 9.8 (L) 07/18/2018 1133   HCT 28.0 (L) 02/03/2021 0049   HCT 19.3 (L) 10/09/2018 1405   PLT 324 02/03/2021 0049   PLT 417 07/18/2018 1133    BMET    Component Value Date/Time   NA 131 (L) 02/03/2021 0049   NA 138 12/23/2017 1558   K 4.8 02/03/2021 0049   CL 90 (L) 02/03/2021 0049   CO2 28 02/03/2021 0049   GLUCOSE 159 (H) 02/03/2021 0049   BUN 32 (H) 02/03/2021 0049   BUN 28 (H) 12/23/2017 1558   CREATININE 8.30 (H) 02/03/2021 0049   CREATININE 12.07 (H) 05/29/2020 1155   CALCIUM 9.9 02/03/2021 0049   CALCIUM 8.5 (L) 11/25/2018 0309   GFRNONAA 6 (L) 02/03/2021 0049   GFRNONAA 79 11/14/2014 1154   GFRAA 5 (L) 01/06/2020 1329   GFRAA >89 11/14/2014 1154    COAG Lab Results  Component Value Date   INR 1.3 (H) 01/28/2021   INR 1.1 05/03/2019   INR 1.2 02/06/2019   No results found for: PTT  Antibiotics Anti-infectives (From admission, onward)    Start     Dose/Rate Route Frequency Ordered Stop   01/30/21 1815  bictegravir-emtricitabine-tenofovir AF (BIKTARVY) 50-200-25 MG per tablet 1 tablet        1 tablet Oral Daily 01/29/21 1753     01/30/21 1800  ceFEPIme (MAXIPIME) 2 g in sodium chloride 0.9 % 100 mL IVPB  Status:  Discontinued        2  g 200 mL/hr over 30 Minutes Intravenous Every T-Th-Sa (1800) 01/29/21 0314 01/29/21 0346   01/30/21 1200  vancomycin (VANCOCIN) IVPB 750 mg/150 ml premix  Status:  Discontinued        750 mg 150 mL/hr over 60 Minutes Intravenous Every T-Th-Sa (Hemodialysis) 01/29/21 0314 01/29/21 0346   01/29/21 1000  bictegravir-emtricitabine-tenofovir AF (BIKTARVY) 50-200-25 MG per tablet 1 tablet  Status:  Discontinued        1 tablet Oral Daily 01/29/21 0346 01/29/21 1753   01/29/21 0300  vancomycin (VANCOREADY) IVPB 1500 mg/300 mL        1,500 mg 150 mL/hr over 120 Minutes Intravenous  Once 01/29/21 0246 01/29/21 0748   01/29/21 0300  ceFEPIme (MAXIPIME) 2 g in sodium chloride 0.9 % 100 mL IVPB        2 g 200 mL/hr over 30 Minutes Intravenous  Once 01/29/21 0246 01/29/21 0439        V. Leia Alf, M.D., [facility] [place] Office: [phone] Pager:  [phone]

## 2021-02-03 NOTE — Progress Notes (Signed)
   Subjective: Patient was seen this morning.  Denies any new complaints.  States her pain is controlled now.  Discussed that we are waiting for CIR placement and insurance approval.  Patient agrees with the plan Objective: Vital signs in last 24 hours: Vitals:   02/02/21 0619 02/02/21 1327 02/02/21 2127 02/03/21 0437  BP: 135/62 (!) 166/70 (!) 145/72 (!) 160/63  Pulse: 82 85 79 84  Resp:  '17 18 18  '$ Temp:  99.6 F (37.6 C) 98.3 F (36.8 C) 98.4 F (36.9 C)  TempSrc:  Oral Oral Oral  SpO2:  100% 98% 98%  Weight:      Height:          Physical Exam Constitutional: Patient sitting in bed..  Not in acute distress.   Cardiovascular: Regular rate and rhythm, normal heart sounds.  No murmurs Pulmonary: Effort normal, breathing comfortably at room air.  Abdominal: Flat and nondistended Musculoskeletal: Right leg amputated below knee in dressing.  Left AKA Skin warm and dry. neurological: Alert and oriented, no focal deficits.   Psychiatric: Normal mood and affect. Assessment/Plan: Principal Problem:   Gangrene of right foot (HCC) Active Problems:   Diabetes mellitus type 2 in obese ([place])   Human immunodeficiency virus (HIV) disease ([place])   ESRD (end stage renal disease) ([place])   Anemia due to chronic kidney disease   Peripheral arterial disease ([place])  Julysa Frank is a 45 year old female admitted for worsening right foot pain in the setting of gangrene secondary to severe PAD. 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  Patient medically stable waiting for CIR placement. S/p Rt BKA for gangrene by Dr. Chaya Jan (01/31/21) Patient states her pain is controlled now.  Denies any new complaints. -Continue pain control with Oxycodone to 10 PRN Q4h -Continue Dilaudid 0.5 mg Q3H PRN for breakthrough pain. -Continue gabapentin 100 mg 3 times per week after hemodialysis on HD days only. -TOC working on [employer] placement.  [place] clinic representative to see her today.  #ESRD, Tuesdays, Thursdays, Saturday -HD per  nephrology  #Type 2 diabetes  -Continue CBGs, Lantus 5 units ,SSI for diabetes.  PT/OT-recommends CIR Dispo: Patient medically stable waiting for CIR placement.  Armando Reichert, MD 02/03/2021, 6:27 AM Pager: [phone] After 5pm on weekdays and 1pm on weekends: Please call On Call pager [phone]

## 2021-02-04 ENCOUNTER — Other Ambulatory Visit: Payer: Self-pay

## 2021-02-04 ENCOUNTER — Ambulatory Visit: Payer: Medicare Other

## 2021-02-04 LAB — CBC
HCT: 27 % — ABNORMAL LOW (ref 36.0–46.0)
Hemoglobin: 8.6 g/dL — ABNORMAL LOW (ref 12.0–15.0)
MCH: 30.1 pg (ref 26.0–34.0)
MCHC: 31.9 g/dL (ref 30.0–36.0)
MCV: 94.4 fL (ref 80.0–100.0)
Platelets: 350 10*3/uL (ref 150–400)
RBC: 2.86 MIL/uL — ABNORMAL LOW (ref 3.87–5.11)
RDW: 15.4 % (ref 11.5–15.5)
WBC: 9.6 10*3/uL (ref 4.0–10.5)
nRBC: 0 % (ref 0.0–0.2)

## 2021-02-04 LAB — RENAL FUNCTION PANEL
Albumin: 2.4 g/dL — ABNORMAL LOW (ref 3.5–5.0)
Anion gap: 13 (ref 5–15)
BUN: 52 mg/dL — ABNORMAL HIGH (ref 6–20)
CO2: 29 mmol/L (ref 22–32)
Calcium: 10.2 mg/dL (ref 8.9–10.3)
Chloride: 85 mmol/L — ABNORMAL LOW (ref 98–111)
Creatinine, Ser: 10.16 mg/dL — ABNORMAL HIGH (ref 0.44–1.00)
GFR, Estimated: 4 mL/min — ABNORMAL LOW (ref >60)
Glucose, Bld: 201 mg/dL — ABNORMAL HIGH (ref 70–99)
Phosphorus: 7.9 mg/dL — ABNORMAL HIGH (ref 2.5–4.6)
Potassium: 5.4 mmol/L — ABNORMAL HIGH (ref 3.5–5.1)
Sodium: 127 mmol/L — ABNORMAL LOW (ref 135–145)

## 2021-02-04 LAB — GLUCOSE, CAPILLARY
Glucose-Capillary: 211 mg/dL — ABNORMAL HIGH (ref 70–99)
Glucose-Capillary: 123 mg/dL — ABNORMAL HIGH (ref 70–99)
Glucose-Capillary: 208 mg/dL — ABNORMAL HIGH (ref 70–99)
Glucose-Capillary: 121 mg/dL — ABNORMAL HIGH (ref 70–99)
Glucose-Capillary: 99 mg/dL (ref 70–99)

## 2021-02-04 MED ORDER — DIPHENHYDRAMINE HCL 25 MG PO CAPS
ORAL_CAPSULE | ORAL | Status: AC
  Filled 2021-02-04: qty 1

## 2021-02-04 MED ORDER — OXYCODONE HCL 5 MG PO TABS
ORAL_TABLET | ORAL | Status: AC
  Administered 2021-02-04: 10 mg via ORAL
  Filled 2021-02-04: qty 2

## 2021-02-04 MED ORDER — WHITE PETROLATUM EX OINT
TOPICAL_OINTMENT | CUTANEOUS | Status: AC
  Filled 2021-02-04: qty 28.35

## 2021-02-04 MED ORDER — DIPHENHYDRAMINE HCL 25 MG PO CAPS
25.0000 mg | ORAL_CAPSULE | Freq: Once | ORAL | Status: AC
  Administered 2021-02-04: 25 mg via ORAL

## 2021-02-04 MED ORDER — AMLODIPINE BESYLATE 5 MG PO TABS: 5.0000 mg | ORAL_TABLET | Freq: Every day | ORAL | Status: DC

## 2021-02-04 NOTE — Progress Notes (Signed)
Orthopedic Tech Progress Note Patient Details:  Kathy Frank 1976/07/17 [id number] Called in order to [facility] for a GRADUATED COMPRESSION STOCKING/RETENTION SOCK/VIVE PROTOCOL BK Patient ID: Kathy Frank, female   DOB: 01/08/1976, 45 y.o.   MRN: [id number]  [name] 02/04/2021, 3:39 PM

## 2021-02-04 NOTE — Progress Notes (Signed)
[facility] Progress Note   Subjective:   no c/o, seen on HD.  Objective Vitals:   02/04/21 1100 02/04/21 1130 02/04/21 1157 02/04/21 1237  BP: 100/70 98/63 132/64 (!) 141/69  Pulse: 76 73 77 82  Resp:   16 19  Temp:   99.3 F (37.4 C) 98.8 F (37.1 C)  TempSrc:   Oral Oral  SpO2:   96% 97%  Weight:   71 kg   Height:       Physical Exam General: Chronically ill-appearing; NAD Heart: Normal S1 and S2; No murmurs, gallops, or rubs Lungs: Clear throughout; No wheezing, rhonchi, or rales Abdomen: Soft and non-tender Extremities: No edema BLLE Dialysis Access: L AVF (+) Bruit/Thrill   Filed Weights   02/04/21 0500 02/04/21 0808 02/04/21 1157  Weight: 74 kg 74 kg 71 kg    Intake/Output Summary (Last 24 hours) at 02/04/2021 1455 Last data filed at 02/04/2021 1157 Gross per 24 hour  Intake 360 ml  Output 3100 ml  Net -2740 ml     Additional Objective Labs: Basic Metabolic Panel: Recent Labs  Lab 02/02/21 0107 02/03/21 0049 02/04/21 0112  NA 132* 131* 127*  K 4.4 4.8 5.4*  CL 92* 90* 85*  CO2 '27 28 29  '$ GLUCOSE 135* 159* 201*  BUN 16 32* 52*  CREATININE 5.37* 8.30* 10.16*  CALCIUM 9.4 9.9 10.2  PHOS 4.0 5.9* 7.9*    Liver Function Tests: Recent Labs  Lab 01/28/21 1803 01/29/21 1607 01/31/21 0239 02/02/21 0107 02/03/21 0049 02/04/21 0112  AST 14* 11*  --   --   --   --   ALT 9 9  --   --   --   --   ALKPHOS 54 54  --   --   --   --   BILITOT 0.7 0.8  --   --   --   --   PROT 8.4* 7.7  --   --   --   --   ALBUMIN 2.9* 2.8*   < > 2.7* 2.4* 2.4*   < > = values in this interval not displayed.    No results for input(s): LIPASE, AMYLASE in the last 168 hours. CBC: Recent Labs  Lab 01/28/21 1803 01/29/21 1607 01/31/21 0239 02/01/21 0042 02/02/21 0107 02/03/21 0049 02/04/21 0112  WBC 6.4   < > 6.0 6.3 8.9 9.5 9.6  NEUTROABS 4.8  --   --   --   --   --   --   HGB 9.0*   < > 9.4* 10.1* 9.3* 9.0* 8.6*  HCT 27.8*   < > 29.1* 32.3*  29.7* 28.0* 27.0*  MCV 93.6   < > 94.5 95.3 96.1 95.2 94.4  PLT 375   < > 339 352 321 324 350   < > = values in this interval not displayed.    Blood Culture    Component Value Date/Time   SDES BLOOD RIGHT ANTECUBITAL 01/28/2021 1812   SPECREQUEST  01/28/2021 1812    BOTTLES DRAWN AEROBIC AND ANAEROBIC Blood Culture adequate volume   CULT  01/28/2021 1812    NO GROWTH 5 DAYS Performed at [facility], [place] [address], [place], [place] [address]    REPTSTATUS 02/02/2021 FINAL 01/28/2021 1812    Cardiac Enzymes: No results for input(s): CKTOTAL, CKMB, CKMBINDEX, TROPONINI in the last 168 hours. CBG: Recent Labs  Lab 02/03/21 1151 02/03/21 1648 02/03/21 2118 02/04/21 0713 02/04/21 1233  GLUCAP 143* 175*  219* 211* 123*    Iron Studies: No results for input(s): IRON, TIBC, TRANSFERRIN, FERRITIN in the last 72 hours. Lab Results  Component Value Date   INR 1.3 (H) 01/28/2021   INR 1.1 05/03/2019   INR 1.2 02/06/2019   Studies/Results: No results found.  Medications:  sodium chloride     iron sucrose 100 mg (02/04/21 1149)   magnesium sulfate bolus IVPB      (feeding supplement) PROSource Plus  30 mL Oral BID BM   aspirin EC  81 mg Oral Daily   atorvastatin  40 mg Oral Daily   bictegravir-emtricitabine-tenofovir AF  1 tablet Oral Daily   calcitRIOL  1 mcg Oral Daily   calcium acetate  1,334 mg Oral TID WC   Chlorhexidine Gluconate Cloth  6 each Topical Daily   [START ON 02/06/2021] darbepoetin (ARANESP) injection - DIALYSIS  100 mcg Intravenous Q Thu-HD   docusate sodium  100 mg Oral Daily   feeding supplement (NEPRO CARB STEADY)  237 mL Oral BID BM   gabapentin  100 mg Oral Once per day on Tue Thu Sat   heparin  5,000 Units Subcutaneous Q8H   insulin aspart  0-15 Units Subcutaneous TID WC   insulin glargine  5 Units Subcutaneous QHS   multivitamin  1 tablet Oral QHS   oxyCODONE  10 mg Oral Q4H   pantoprazole  40 mg Oral Daily   polyethylene glycol  17 g  Oral Daily   senna  1 tablet Oral BID   sertraline  50 mg Oral QHS   sodium chloride flush  3 mL Intravenous Q12H   white petrolatum        Dialysis: [facility]-TTS 4h   75 kg    400/1.5    2/ 2.0 bath  AVF Hep 3000  -Calcitriol 1.25 mcg p.o. q. 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  ESRD: HD TTS.  HD today.  No heparin post op for now.  Hypertension/volume: BP good now, down 3kg under prior edw which is typical for a BKA.  No vol ^ on exam.  Anemia of ESRD: Hgb 9.0 Last ESA given 01/23/21. Follow HGB. Transfuse if needed. Resume Fe load post BKA.  Metabolic bone disease: PO4 at goal, C Ca >10. Continue binders- phoslo , decrease VDRA- calcitriol. Nutrition: Albumin low. renal carb modified diet albumin today protein supplement with prosource. HIV-meds per primary Diabetes mellitus type 2-per primary Depressive disorder on sertraline per primary  Kelly Splinter, MD 02/04/2021, 2:58 PM

## 2021-02-04 NOTE — Progress Notes (Signed)
Repeat order placed to [facility] for Amputation care including replacement of shrinker, education, etc.  OK to got o rehab from vascular perspective   Kathy Frank

## 2021-02-04 NOTE — Progress Notes (Signed)
   Subjective: Patient was seen this morning initially.  Denies any new complaints.  Patient states she wants to go to CIR only and not to SNF.  Patient states the rehab coordinator already talked to her about starting appeal process and she wants to do that.  Objective: Vital signs in last 24 hours: Vitals:   02/03/21 1525 02/03/21 2121 02/04/21 0500 02/04/21 0622  BP: (!) 170/77 (!) 177/82  (!) 173/82  Pulse: 81 86  84  Resp: '18 18  18  '$ Temp: 99.1 F (37.3 C) 99.4 F (37.4 C)  98.8 F (37.1 C)  TempSrc: Oral Oral  Oral  SpO2: 97% 94%  96%  Weight:   74 kg   Height:         Physical Exam Constitutional: Pt lying in HD bed. Not in acute distress.   Cardiovascular: Regular rate and rhythm, Normal heart sounds. No murmurs. Pulmonary: Effort normal, Breathing comfortably at room air.  Abdominal: Flat, non distended.  Musculoskeletal: Right leg amputated below knee in dressing. Left AKA. Skin Warm and Dry. neurological: Alert and oriented, No focal deficits.  Psychiatric: Normal mood and affect.  Assessment/Plan: Principal Problem:   Gangrene of right foot (HCC) Active Problems:   Diabetes mellitus type 2 in obese ([place])   Human immunodeficiency virus (HIV) disease ([place])   ESRD (end stage renal disease) ([place])   Anemia due to chronic kidney disease   Peripheral arterial disease ([place])  Kathy Frank is a 45 year old female admitted for worsening right foot pain in the setting of gangrene secondary to severe PAD. 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  Patient medically stable waiting for CIR placement.  (Waiting for insurance approval)  S/p Rt BKA for gangrene by Dr. Chaya Jan (01/31/21) Patient states her pain is controlled.  Denies any new complaints. -Continue pain control with Oxycodone to 10 PRN Q4h -Continue Dilaudid 0.5 mg Q3H PRN for breakthrough pain. -Continue gabapentin 100 mg 3 times per week after hemodialysis on HD days only. -TOC working on [facility] [employer] placement.  Waiting for insurance  approval now.  Most likely need to be appealed which could take 1 week.  #ESRD, Tuesdays, Thursdays, Saturday -HD per nephrology  #Type 2 diabetes  -Continue CBGs, Lantus 5 units ,SSI for diabetes.  PT/OT-recommends CIR Dispo: Patient medically stable waiting for CIR placement.  Armando Reichert, MD 02/04/2021, 6:26 AM Pager: [phone] After 5pm on weekdays and 1pm on weekends: Please call On Call pager [phone]

## 2021-02-04 NOTE — Progress Notes (Signed)
Inpatient Rehab Admissions Coordinator:   I do not have insurance auth or a bed available for this pt. Today on CIR. I will follow for potential admit pending insurance auth and bed availability.   Clemens Catholic, [place], [place] Admissions Coordinator  [phone] ([place]) [phone] (office)

## 2021-02-04 NOTE — Anesthesia Postprocedure Evaluation (Signed)
Anesthesia Post Note  Patient: Kathy Frank  Procedure(s) Performed: AMPUTATION BELOW KNEE (Right: Knee)     Patient location during evaluation: PACU Anesthesia Type: Regional and General Level of consciousness: awake and alert Pain management: pain level controlled Vital Signs Assessment: post-procedure vital signs reviewed and stable Respiratory status: spontaneous breathing, nonlabored ventilation, respiratory function stable and patient connected to nasal cannula oxygen Cardiovascular status: blood pressure returned to baseline and stable Postop Assessment: no apparent nausea or vomiting Anesthetic complications: no   No notable events documented.  Last Vitals:  Vitals:   02/04/21 0854 02/04/21 0900  BP: 116/63 127/66  Pulse: 78 79  Resp:    Temp:    SpO2:      Last Pain:  Vitals:   02/04/21 0808  TempSrc: Oral  PainSc: 3                  Belenda Cruise P Alixandria Friedt

## 2021-02-04 NOTE — Progress Notes (Signed)
Pt off unit to dialysis

## 2021-02-05 ENCOUNTER — Ambulatory Visit: Payer: Medicare Other | Admitting: Internal Medicine

## 2021-02-05 LAB — CBC
HCT: 26.1 % — ABNORMAL LOW (ref 36.0–46.0)
Hemoglobin: 8.1 g/dL — ABNORMAL LOW (ref 12.0–15.0)
MCH: 29.6 pg (ref 26.0–34.0)
MCHC: 31 g/dL (ref 30.0–36.0)
MCV: 95.3 fL (ref 80.0–100.0)
Platelets: 359 10*3/uL (ref 150–400)
RBC: 2.74 MIL/uL — ABNORMAL LOW (ref 3.87–5.11)
RDW: 15.4 % (ref 11.5–15.5)
WBC: 8.3 10*3/uL (ref 4.0–10.5)
nRBC: 0 % (ref 0.0–0.2)

## 2021-02-05 LAB — RENAL FUNCTION PANEL
Albumin: 2.4 g/dL — ABNORMAL LOW (ref 3.5–5.0)
Anion gap: 13 (ref 5–15)
BUN: 27 mg/dL — ABNORMAL HIGH (ref 6–20)
CO2: 27 mmol/L (ref 22–32)
Calcium: 9.4 mg/dL (ref 8.9–10.3)
Chloride: 90 mmol/L — ABNORMAL LOW (ref 98–111)
Creatinine, Ser: 6.1 mg/dL — ABNORMAL HIGH (ref 0.44–1.00)
GFR, Estimated: 8 mL/min — ABNORMAL LOW (ref >60)
Glucose, Bld: 132 mg/dL — ABNORMAL HIGH (ref 70–99)
Phosphorus: 4.7 mg/dL — ABNORMAL HIGH (ref 2.5–4.6)
Potassium: 5 mmol/L (ref 3.5–5.1)
Sodium: 130 mmol/L — ABNORMAL LOW (ref 135–145)

## 2021-02-05 LAB — GLUCOSE, CAPILLARY
Glucose-Capillary: 125 mg/dL — ABNORMAL HIGH (ref 70–99)
Glucose-Capillary: 151 mg/dL — ABNORMAL HIGH (ref 70–99)
Glucose-Capillary: 108 mg/dL — ABNORMAL HIGH (ref 70–99)
Glucose-Capillary: 153 mg/dL — ABNORMAL HIGH (ref 70–99)

## 2021-02-05 MED ORDER — HYDROMORPHONE HCL 1 MG/ML IJ SOLN: 0.5000 mg | Freq: Four times a day (QID) | INTRAMUSCULAR | Status: DC | PRN

## 2021-02-05 MED ORDER — SODIUM CHLORIDE 0.9 % IV SOLN: 100.0000 mL | INTRAVENOUS | Status: DC | PRN

## 2021-02-05 MED ORDER — POLYETHYLENE GLYCOL 3350 17 G PO PACK
17.0000 g | PACK | Freq: Two times a day (BID) | ORAL | Status: DC
  Administered 2021-02-05 – 2021-02-08 (×5): 17 g via ORAL
  Filled 2021-02-05 (×6): qty 1

## 2021-02-05 MED ORDER — SENNA 8.6 MG PO TABS
2.0000 | ORAL_TABLET | Freq: Two times a day (BID) | ORAL | Status: DC
  Administered 2021-02-05 – 2021-02-08 (×6): 17.2 mg via ORAL
  Filled 2021-02-05 (×7): qty 2

## 2021-02-05 NOTE — Progress Notes (Signed)
   Subjective: Patient was seen this morning. Reports her pain in controlled. She continues to be constipated. States she is backed up.  Denies any other complaints.  Objective: Vital signs in last 24 hours: Vitals:   02/04/21 1157 02/04/21 1237 02/04/21 2038 02/05/21 0505  BP: 132/64 (!) 141/69 (!) 156/67 (!) 155/73  Pulse: 77 82 83 82  Resp: '16 19 16 16  '$ Temp: 99.3 F (37.4 C) 98.8 F (37.1 C) 99.7 F (37.6 C) 98.5 F (36.9 C)  TempSrc: Oral Oral Oral Oral  SpO2: 96% 97% 96% 100%  Weight: 71 kg     Height:        Physical Exam Constitutional: Patient sitting in bed comfortably.  Not in acute distress.   Cardiovascular: Regular rate and rhythm, normal heart sounds.  Normal murmurs  pulmonary: Effort normal, breathing comfortably at room air. Abdominal: Flat, nondistended.   Musculoskeletal: Right leg amputated below knee in dressing.  Left AKA.   Skin warm and dry.   Neurological: Alert and oriented, no focal deficits. Psychiatric: Normal mood and affect.    Principal Problem:   Gangrene of right foot ([place]) Active Problems:   Diabetes mellitus type 2 in obese ([place])   Human immunodeficiency virus (HIV) disease ([place])   ESRD (end stage renal disease) ([place])   Anemia due to chronic kidney disease   Peripheral arterial disease ([place])  Sanjuanita Kathy Frank is a 45 year old female admitted for worsening right foot pain in the setting of gangrene secondary to severe PAD. 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  Patient medically stable waiting for CIR placement (insurance denied, patient want to appeal it.)  S/p Rt BKA for gangrene by Dr. Chaya Jan (01/31/21) Patient states her pain is controlled now.  Denies any new complaints. -Continue pain control with Oxycodone to 10 PRN Q4h -Reduce Dilaudid 0.5 mg to Q6H PRN for breakthrough pain. -Continue gabapentin 100 mg 3 times per week after hemodialysis on HD days only. -TOC working on [employer] placement.  Denied by insurance. Most likely need to be appealed which  could take 1 week.  #ESRD, Tuesdays, Thursdays, Saturday -HD per nephrology  #Type 2 diabetes  -Continue CBGs, Lantus 5 units ,SSI for diabetes.  #Constipation, iatrogenic  Patient complains of constipation.  Likely secondary to pain medication. No n/v.  - increase Senna to 2 tablets BID - Miralax BID -Soap enema once today  PT/OT-  CIR Dispo: Patient medically stable waiting for CIR placement.Denied by insurance. Will be appealed.  Will need to be.  Armando Reichert, MD 02/05/2021, 6:21 AM Pager: [phone] After 5pm on weekdays and 1pm on weekends: Please call On Call pager [phone]

## 2021-02-05 NOTE — Progress Notes (Signed)
[facility] Progress Note   Subjective:     Patient seen and examined at bedside. She is sitting up in chair eating lunch and daughter also at bedside. No complaints at this time. Denies SOB, CP, ABD pain, and N/V/D. Tolerated yesterday's HD with net UF 3L. She is inquiring about getting an electric scooter once she is discharged. I spoke to RN via phone and advised to relay this request to the unit's case manager.  Objective Vitals:   02/04/21 1157 02/04/21 1237 02/04/21 2038 02/05/21 0505  BP: 132/64 (!) 141/69 (!) 156/67 (!) 155/73  Pulse: 77 82 83 82  Resp: '16 19 16 16  '$ Temp: 99.3 F (37.4 C) 98.8 F (37.1 C) 99.7 F (37.6 C) 98.5 F (36.9 C)  TempSrc: Oral Oral Oral Oral  SpO2: 96% 97% 96% 100%  Weight: 71 kg     Height:       Physical Exam General: Chronically ill-appearing; NAD Heart: Normal S1 and S2; No murmurs, gallops, or rubs Lungs: Clear throughout; No wheezing, rhonchi, or rales Abdomen: Soft and non-tender Extremities: No edema L AKA; brace in place for R BKA. Dialysis Access: L AVF (+) Bruit/Thrill   Filed Weights   02/04/21 0500 02/04/21 0808 02/04/21 1157  Weight: 74 kg 74 kg 71 kg   No intake or output data in the 24 hours ending 02/05/21 1304  Additional Objective Labs: Basic Metabolic Panel: Recent Labs  Lab 02/03/21 0049 02/04/21 0112 02/05/21 0322  NA 131* 127* 130*  K 4.8 5.4* 5.0  CL 90* 85* 90*  CO2 '28 29 27  '$ GLUCOSE 159* 201* 132*  BUN 32* 52* 27*  CREATININE 8.30* 10.16* 6.10*  CALCIUM 9.9 10.2 9.4  PHOS 5.9* 7.9* 4.7*   Liver Function Tests: Recent Labs  Lab 01/29/21 1607 01/31/21 0239 02/03/21 0049 02/04/21 0112 02/05/21 0322  AST 11*  --   --   --   --   ALT 9  --   --   --   --   ALKPHOS 54  --   --   --   --   BILITOT 0.8  --   --   --   --   PROT 7.7  --   --   --   --   ALBUMIN 2.8*   < > 2.4* 2.4* 2.4*   < > = values in this interval not displayed.   No results for input(s): LIPASE, AMYLASE in  the last 168 hours. CBC: Recent Labs  Lab 02/01/21 0042 02/02/21 0107 02/03/21 0049 02/04/21 0112 02/05/21 0322  WBC 6.3 8.9 9.5 9.6 8.3  HGB 10.1* 9.3* 9.0* 8.6* 8.1*  HCT 32.3* 29.7* 28.0* 27.0* 26.1*  MCV 95.3 96.1 95.2 94.4 95.3  PLT 352 321 324 350 359   Blood Culture    Component Value Date/Time   SDES BLOOD RIGHT ANTECUBITAL 01/28/2021 1812   SPECREQUEST  01/28/2021 1812    BOTTLES DRAWN AEROBIC AND ANAEROBIC Blood Culture adequate volume   CULT  01/28/2021 1812    NO GROWTH 5 DAYS Performed at [facility], [place] [address], [place], [place] [address]    REPTSTATUS 02/02/2021 FINAL 01/28/2021 1812    Cardiac Enzymes: No results for input(s): CKTOTAL, CKMB, CKMBINDEX, TROPONINI in the last 168 hours. CBG: Recent Labs  Lab 02/04/21 1713 02/04/21 2043 02/04/21 2104 02/05/21 0815 02/05/21 1151  GLUCAP 208* 121* 99 125* 151*   Iron Studies: No results for input(s): IRON, TIBC, TRANSFERRIN, FERRITIN  in the last 72 hours. Lab Results  Component Value Date   INR 1.3 (H) 01/28/2021   INR 1.1 05/03/2019   INR 1.2 02/06/2019   Studies/Results: No results found.  Medications:  sodium chloride     iron sucrose 100 mg (02/04/21 1149)   magnesium sulfate bolus IVPB      (feeding supplement) PROSource Plus  30 mL Oral BID BM   aspirin EC  81 mg Oral Daily   atorvastatin  40 mg Oral Daily   bictegravir-emtricitabine-tenofovir AF  1 tablet Oral Daily   calcitRIOL  1 mcg Oral Daily   calcium acetate  1,334 mg Oral TID WC   Chlorhexidine Gluconate Cloth  6 each Topical Daily   [START ON 02/06/2021] darbepoetin (ARANESP) injection - DIALYSIS  100 mcg Intravenous Q Thu-HD   feeding supplement (NEPRO CARB STEADY)  237 mL Oral BID BM   gabapentin  100 mg Oral Once per day on Tue Thu Sat   heparin  5,000 Units Subcutaneous Q8H   insulin aspart  0-15 Units Subcutaneous TID WC   insulin glargine  5 Units Subcutaneous QHS   multivitamin  1 tablet Oral QHS    oxyCODONE  10 mg Oral Q4H   pantoprazole  40 mg Oral Daily   polyethylene glycol  17 g Oral BID   senna  2 tablet Oral BID   sertraline  50 mg Oral QHS   sodium chloride flush  3 mL Intravenous Q12H    Dialysis Orders: [facility]-TTS 4h   75 kg    400/1.5    2/ 2.0 bath  AVF Hep 3000 -Calcitriol 1.25 mcg p.o. q. 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 ESRD: HD TTS-Tolerated HD yesterday with net UF 3L. No heparin post op. Hypertension/volume: BP good now, under prior edw which is typical for a BKA.  No vol ^ on exam-will lower EDW at discharge. Anemia of ESRD: Hgb now 8.1.-ESA dose scheduled to be given with HD 6/30.Transfuse if needed. Resume Fe load post BKA.  Metabolic bone disease: PO4 at goal, C Ca >10. Continue binders- phoslo , decrease VDRA- calcitriol. Nutrition: Albumin low. renal carb modified diet albumin today protein supplement with prosource. HIV-meds per primary Diabetes mellitus type 2-per primary Depressive disorder on sertraline per primary  Kathy Poet, NP [facility] 02/05/2021,1:04 PM  LOS: 7 days

## 2021-02-05 NOTE — Progress Notes (Signed)
Inpatient Rehab Admissions Coordinator:   I have received a denial for CIR from pt.'s insurance. Pt. Wishes to pursue appeal. I will begin appeal today. This process may take up to 3 days.   Clemens Catholic, [place], [place] Admissions Coordinator  [phone] ([place]) [phone] (office)

## 2021-02-05 NOTE — PMR Pre-admission (Signed)
PMR Admission Coordinator Pre-Admission Assessment  Patient: Kathy Frank is an 45 y.o., female MRN: [id number] DOB: March 11, 1976 Height: '5\' 5"'$  (165.1 cm) Weight: 71.8 kg  Insurance Information HMO:  yes   PPO:      PCP:      IPA:      80/20: yes     OTHER:  PRIMARY: UHC Medicare      Policy#: [id number]       Subscriber: Pt CM Name: n/a      Phone#:      Fax#: [phone] Pre-Cert#: [id number]   appealed.  Approval received from [place] on 02/07/21 for 7 days with review on 02/12/21,  Authorization ID: [id number].   Employer: n/a  Online - [email or web address] Eff Date: 10/08/2020 - still active Deductible: $0 OOP Max: $7,550 ($0 met) CIR: $1480 admission copay SNF: $0.00 Copayment per day for days 1-20; $194.50 Copayment per day for days 21-100; Maximum of 100 days/benefit period Outpatient: 100% coverage Home Health:  100% coverage, 0% co-insurance; limited by medical necessity DME: 80% coverage; 20% co-insurance Providers: In network   SECONDARY: Medicaid       Policy#: [id number]     Phone#:   Financial Counselor:       Phone#:   The "Data Collection Information Summary" for patients in Inpatient Rehabilitation Facilities with attached "Privacy Act [place] Records" was provided and verbally reviewed with: Patient  Emergency Contact Information Contact Information     Name Relation Home Work [place]   [phone]   Claudine Mouton Mother   [phone]       Current Medical History  Patient Admitting Diagnosis: BKA History of Present Illness: 45 y/o female presented to ED on 6/22 for R foot pain and difficulty weight bearing with concerns of gangrene. 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 PMH: anxiety, ESRD on HD, depression, HTN, HIV, type 2 DM, L AKA. Postoperative pain management and constipation.  Patient's medical record from [facility] has been reviewed by the rehabilitation admission coordinator and physician.  Past Medical  History  Past Medical History:  Diagnosis Date   Anal abscess    chronic   Anxiety    CKD (chronic kidney disease) stage 5, GFR less than 15 ml/min (HCC) 06/19/2016   . Suspect etiologies to include diabetes and hypertension though cannot exclude HIV-associated nephropathy. Advised her against from using excessive Goody powder.   Constipation    trouble stoll coming out   Depression 06/28/2006   Qualifier: Diagnosis of  By: Riccardo Dubin MD, Todd     Diabetes mellitus type 2 in obese ([place]) 06/28/1994   DKA (diabetic ketoacidoses) 12/2014   Dyspnea    uses oxygeb 2 liters per minute at dialysis   End stage renal disease on dialysis [facility])    Erosive esophagitis    Esophageal reflux    ESRD on hemodialysis ([place]) 05/02/2019   Eye redness    Gastroparesis    ? diabetic   GERD (gastroesophageal reflux disease)    History of blood transfusion 2019   History of hypertension    off bp meds 2020 due to low bp with dialysis   Human immunodeficiency virus (HIV) disease ([place]) [id number]   Metabolic bone disease [id number]   Moderate nonproliferative diabetic retinopathy of both eyes ([place]) 11/21/2014   11/14/14: Noted on retinal imaging; needs follow-up imaging in 6 months  05/22/16: Noted on retinal imaging again; needs follow-up imaging in 6 months   Normocytic anemia  Pruritus, unspecified 05/20/2019   Renal failure (ARF), acute on chronic ([place]) 11/24/2018   tues, thurs sat [address] hs since sept 2020   Shortness of breath 07/01/2019   when lays doan at night, musy prop head up on 2 regular and 2 small pillows   Type 2 diabetes mellitus with diabetic peripheral angiopathy without gangrene ([place]) 05/01/2019   Type II diabetes mellitus ([place]) 1994   diagnosed around 1994   Wears dentures    lower   Wound dehiscence    Wound infection s/p L transmetatarsal amputation     Family History   family history includes Diabetes in her brother, daughter, daughter, maternal grandmother, and mother;  Mental retardation in her brother.  Prior Rehab/Hospitalizations Has the patient had prior rehab or hospitalizations prior to admission? Yes  Has the patient had major surgery during 100 days prior to admission? Yes   Current Medications  Current Facility-Administered Medications:    (feeding supplement) PROSource Plus liquid 30 mL, 30 mL, Oral, BID BM, Valentina Gu, NP, 30 mL at 02/07/21 1500   0.9 %  sodium chloride infusion, 250 mL, Intravenous, PRN, Theda Sers, Emma M, PA-C   acetaminophen (TYLENOL) tablet 650 mg, 650 mg, Oral, Q6H PRN, 650 mg at 01/31/21 5465 **OR** acetaminophen (TYLENOL) suppository 650 mg, 650 mg, Rectal, Q6H PRN, Laurence Slate M, PA-C   alteplase (CATHFLO ACTIVASE) injection 2 mg, 2 mg, Intracatheter, Once PRN, Theda Sers, Emma M, PA-C   amLODipine (NORVASC) tablet 5 mg, 5 mg, Oral, Daily, [name], MD, 5 mg at 02/07/21 1849   aspirin EC tablet 81 mg, 81 mg, Oral, Daily, Laurence Slate M, PA-C, 81 mg at 02/07/21 1139   atorvastatin (LIPITOR) tablet 40 mg, 40 mg, Oral, Daily, Laurence Slate M, PA-C, 40 mg at 02/07/21 1140   bictegravir-emtricitabine-tenofovir AF (BIKTARVY) 50-200-25 MG per tablet 1 tablet, 1 tablet, Oral, Daily, Collins, Emma M, PA-C, 1 tablet at 02/07/21 1140   bisacodyl (DULCOLAX) EC tablet 5 mg, 5 mg, Oral, Daily, [name], MD   calcitRIOL (ROCALTROL) capsule 1 mcg, 1 mcg, Oral, Daily, Valentina Gu, NP, 1 mcg at 02/07/21 1141   calcium acetate (PHOSLO) capsule 2,001 mg, 2,001 mg, Oral, TID WC, Adelfa Koh, NP, 2,001 mg at 02/07/21 1759   camphor-menthol (SARNA) lotion 1 application, 1 application, Topical, PRN, Penninger, Ria Comment, PA   Chlorhexidine Gluconate Cloth 2 % PADS 6 each, 6 each, Topical, Daily, Ulyses Amor, PA-C, 6 each at 02/07/21 1142   Chlorhexidine Gluconate Cloth 2 % PADS 6 each, 6 each, Topical, Q0600, Penninger, Ria Comment, PA, 6 each at 02/08/21 0423   clopidogrel (PLAVIX) tablet 75 mg, 75 mg, Oral,  Daily, Velna Ochs, MD, 75 mg at 02/07/21 1801   Darbepoetin Alfa (ARANESP) injection 100 mcg, 100 mcg, Intravenous, Q Sat-HD, Velna Ochs, MD   feeding supplement (NEPRO CARB STEADY) liquid 237 mL, 237 mL, Oral, BID BM, Collins, Emma M, PA-C, 237 mL at 02/07/21 1600   gabapentin (NEURONTIN) capsule 100 mg, 100 mg, Oral, Once per day on Tue Thu Sat, Doda, Vandana, MD, 100 mg at 02/04/21 1810   guaiFENesin-dextromethorphan (ROBITUSSIN DM) 100-10 MG/5ML syrup 15 mL, 15 mL, Oral, Q4H PRN, Laurence Slate M, PA-C   heparin injection 1,000 Units, 1,000 Units, Dialysis, PRN, Laurence Slate M, PA-C   heparin injection 5,000 Units, 5,000 Units, Subcutaneous, Q8H, Laurence Slate M, [place], 5,000 Units at 02/08/21 0423   HYDROmorphone (DILAUDID) injection 0.5 mg, 0.5 mg, Intravenous, Q8H PRN, [name], MD  insulin aspart (novoLOG) injection 0-15 Units, 0-15 Units, Subcutaneous, TID WC, Collins, Emma M, PA-C, 2 Units at 02/07/21 1900   insulin glargine (LANTUS) injection 5 Units, 5 Units, Subcutaneous, QHS, Collins, Emma M, [place], 5 Units at 02/07/21 2051   iron sucrose (VENOFER) 100 mg in sodium chloride 0.9 % 100 mL IVPB, 100 mg, Intravenous, Q T,Th,Sa-HD, Valentina Gu, NP, Last Rate: 420 mL/hr at 02/04/21 1149, 100 mg at 02/04/21 1149   lidocaine (PF) (XYLOCAINE) 1 % injection 5 mL, 5 mL, Intradermal, PRN, Theda Sers, Emma M, PA-C   lidocaine-prilocaine (EMLA) cream 1 application, 1 application, Topical, PRN, Theda Sers, Emma M, PA-C   magnesium sulfate IVPB 2 g 50 mL, 2 g, Intravenous, Daily PRN, Theda Sers, Emma M, PA-C   metoprolol tartrate (LOPRESSOR) injection 2-5 mg, 2-5 mg, Intravenous, Q2H PRN, Theda Sers, Emma M, PA-C   multivitamin (RENA-VIT) tablet 1 tablet, 1 tablet, Oral, QHS, Collins, Emma M, PA-C, 1 tablet at 02/07/21 2051   ondansetron [facility]) injection 4 mg, 4 mg, Intravenous, Q6H PRN, Laurence Slate M, PA-C, 4 mg at 02/05/21 1559   oxyCODONE (Oxy IR/ROXICODONE) immediate release  tablet 5 mg, 5 mg, Oral, Q6H PRN, [name], MD, 5 mg at 02/08/21 0421   pantoprazole (PROTONIX) EC tablet 40 mg, 40 mg, Oral, Daily, Laurence Slate M, PA-C, 40 mg at 02/07/21 1139   pentafluoroprop-tetrafluoroeth (GEBAUERS) aerosol 1 application, 1 application, Topical, PRN, Theda Sers, Emma M, PA-C   phenol (CHLORASEPTIC) mouth spray 1 spray, 1 spray, Mouth/Throat, PRN, Theda Sers, Emma M, PA-C   polyethylene glycol (MIRALAX / GLYCOLAX) packet 17 g, 17 g, Oral, BID, [name], MD, 17 g at 02/07/21 2053   senna (SENOKOT) tablet 17.2 mg, 2 tablet, Oral, BID, [name], MD, 17.2 mg at 02/07/21 2051   sertraline (ZOLOFT) tablet 50 mg, 50 mg, Oral, QHS, Collins, Emma M, PA-C, 50 mg at 02/07/21 2051   sodium chloride flush (NS) 0.9 % injection 3 mL, 3 mL, Intravenous, Q12H, Collins, Emma M, PA-C, 3 mL at 02/06/21 0841   sodium chloride flush (NS) 0.9 % injection 3 mL, 3 mL, Intravenous, PRN, Laurence Slate M, PA-C  Patients Current Diet:  Diet Order             Diet Carb Modified Fluid consistency: Thin; Room service appropriate? Yes  Diet effective now           Diet - low sodium heart healthy                  Precautions / Restrictions Precautions Precautions: Fall Precaution Comments: L AKA - has prosthetic but does not use ("too heavy") Other Brace: R limb protector Restrictions Weight Bearing Restrictions: Yes RLE Weight Bearing: Non weight bearing   Has the patient had 2 or more falls or a fall with injury in the past year? No  Prior Activity Level Limited Community (1-2x/wk): Pt. was active in the community PTA  Prior Functional Level Self Care: Did the patient need help bathing, dressing, using the toilet or eating? Independent  Indoor Mobility: Did the patient need assistance with walking from room to room (with or without device)? Independent  Stairs: Did the patient need assistance with internal or external stairs (with or without device)? Independent  Functional  Cognition: Did the patient need help planning regular tasks such as shopping or remembering to take medications? Independent  Home Equities trader / Equipment Home Assistive Devices/Equipment: None Home Equipment: Shower seat, Wheelchair - manual, Environmental consultant - 2 wheels  Prior  Device Use: Indicate devices/aids used by the patient prior to current illness, exacerbation or injury? None of the above  Current Functional Level Cognition  Overall Cognitive Status: Within Functional Limits for tasks assessed Orientation Level: Oriented X4    Extremity Assessment (includes Sensation/Coordination)  Upper Extremity Assessment: Overall WFL for tasks assessed  Lower Extremity Assessment: Defer to PT evaluation (L AKA, new R BKA) RLE: Unable to fully assess due to pain    ADLs  Overall ADL's : Needs assistance/impaired Eating/Feeding: Set up, Sitting Grooming: Wash/dry hands, Wash/dry face, Sitting Upper Body Bathing: Set up, Sitting Lower Body Bathing: Min guard, Minimal assistance, Sitting/lateral leans Upper Body Dressing : Set up, Sitting Lower Body Dressing: Min guard, Minimal assistance, Sitting/lateral leans Toilet Transfer: Min guard, Transfer board, Requires drop arm Toilet Transfer Details (indicate cue type and reason): simulated - pt with great ability to lateral scoot between surfaces Toileting- Clothing Manipulation and Hygiene: Min guard, Minimal assistance Tub/ Banker: Minimal assistance, Moderate assistance, Transfer board Functional mobility during ADLs: Min guard General ADL Comments: Pt requested to remain in the bed due to leaving for dialysis soon. performed grooming EOB, and lateral scoots along EOB.    Mobility  Overal bed mobility: Modified Independent    Transfers  Overall transfer level: Needs assistance Equipment used: None Transfers: Lateral/Scoot Transfers Anterior-Posterior transfers: Min assist  Lateral/Scoot Transfers: Min guard General transfer  comment: did not perform today    Ambulation / Gait / Stairs / Emergency planning/management officer  Ambulation/Gait General Gait Details: unable    Posture / Balance Dynamic Sitting Balance Sitting balance - Comments: fair once up to side Balance Overall balance assessment: Needs assistance Sitting-balance support:  (long sits on the bed) Sitting balance-Leahy Scale: Fair Sitting balance - Comments: fair once up to side Standing balance comment: Unable    Special needs/care consideration Dialysis: Hemodialysis Tuesday, Thursday, and Saturday   Previous Home Environment  Living Arrangements: Children Available Help at Discharge: Family, Available PRN/intermittently Type of Home: House Home Layout: One level Home Access: Stairs to enter Entrance Stairs-Rails: Right, Left, Can reach both Entrance Stairs-Number of Steps: 4 Bathroom Shower/Tub: Chiropodist: Standard Bathroom Accessibility: Yes How Accessible: Accessible via walker [place]: No  Discharge Living Setting Plans for Discharge Living Setting: Patient's home Type of Home at Discharge: House Discharge Home Layout: One level Discharge Home Access: Stairs to enter Entrance Stairs-Rails: Right, Left, Can reach both Entrance Stairs-Number of Steps: 4 Discharge Bathroom Shower/Tub: Tub/shower unit Discharge Bathroom Toilet: Standard Discharge Bathroom Accessibility: Yes How Accessible: Accessible via walker, Accessible via wheelchair Does the patient have any problems obtaining your medications?: No  Social/Family/Support Systems Patient Roles: Parent Contact Information: [phone] Anticipated Caregiver: Demetris Gareth [place] Anticipated Caregiver's Contact Information: [phone] Ability/Limitations of Caregiver: Can do supervision A Caregiver Availability: 24/7 Discharge Plan Discussed with Primary Caregiver: Yes Is Caregiver In Agreement with Plan?: Yes Does Caregiver/Family have Issues with  Lodging/Transportation while Pt is in Rehab?: Yes  Goals Patient/Family Goal for Rehab: PT/TOT Mod I, wheelchair level Expected length of stay: 5-7 days Pt/Family Agrees to Admission and willing to participate: Yes Program Orientation Provided & Reviewed with Pt/Caregiver Including Roles  & Responsibilities: Yes  Decrease burden of Care through IP rehab admission: Specialzed equipment needs, Decrease number of caregivers, and Patient/family education  Possible need for SNF placement upon discharge: not anticipated   Patient Condition: I have reviewed medical records from [facility] , spoken with CM, and patient and family member.  I met with patient at the bedside and discussed via phone for inpatient rehabilitation assessment.  Patient will benefit from ongoing PT, OT, and SLP, can actively participate in 3 hours of therapy a day 5 days of the week, and can make measurable gains during the admission.  Patient will also benefit from the coordinated team approach during an Inpatient Acute Rehabilitation admission.  The patient will receive intensive therapy as well as Rehabilitation physician, nursing, social worker, and care management interventions.  Due to safety, skin/wound care, disease management, medication administration, pain management, and patient education the patient requires 24 hour a day rehabilitation nursing.  The patient is currently Min G-Mod I with mobility and Min G with basic ADLs.  Discharge setting and therapy post discharge at home with home health is anticipated.  Patient has agreed to participate in the Acute Inpatient Rehabilitation Program and will admit today.  Preadmission Screen Completed BV:APOLI Staley with updates by Danne Baxter, RN and Gayland Curry, 02/08/2021 9:40 AM ______________________________________________________________________   Discussed status with Dr. Naaman Plummer on 02/08/21  at 9:40 AM and received approval for admission  today.  Admission Coordinator:  Clemens Catholic with updates by Danne Baxter, RN and Gayland Curry, MS, CCC-SLP, time 9:40 AM/Date 02/08/21    Assessment/Plan: Diagnosis: Right BKA Does the need for close, 24 hr/day Medical supervision in concert with the patient's rehab needs make it unreasonable for this patient to be served in a less intensive setting? Yes Co-Morbidities requiring supervision/potential complications: ESRD on HD, HTN, HIV, DM, L AKA Due to bladder management, bowel management, safety, skin/wound care, disease management, medication administration, pain management, and patient education, does the patient require 24 hr/day rehab nursing? Yes Does the patient require coordinated care of a physician, rehab nurse, PT, OT to address physical and functional deficits in the context of the above medical diagnosis(es)? Yes Addressing deficits in the following areas: balance, endurance, locomotion, strength, transferring, bowel/bladder control, bathing, dressing, feeding, grooming, toileting, and psychosocial support Can the patient actively participate in an intensive therapy program of at least 3 hrs of therapy 5 days a week? Yes The potential for patient to make measurable gains while on inpatient rehab is excellent Anticipated functional outcomes upon discharge from inpatient rehab: modified independent PT, modified independent OT, n/a SLP Estimated rehab length of stay to reach the above functional goals is: 5-7days Anticipated discharge destination: Home 10. Overall Rehab/Functional Prognosis: excellent   MD Signature: Meredith Staggers, MD, [facility] 02/08/2021

## 2021-02-05 NOTE — Progress Notes (Signed)
Physical Therapy Treatment Patient Details Name: Kathy Frank MRN: [id number] DOB: Nov 08, 1975 Today's Date: 02/05/2021    History of Present Illness 45 y/o female presented to ED on 6/22 for R foot pain and difficulty weight bearing with concerns of gangrene. 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 PMH: anxiety, ESRD on HD, depression, HTN, HIV, type 2 DM, L AKA    PT Comments    -Pt was seen for mobility off her bed to wheelchair with min assist and cues for sequence, then tried AP back to bed with cues.  Returned AP to wheelchair, tried rolling around in the room with issues of coming close to obstacles.  Returned to [employer] chair finally, and worked on ROM to [employer] out of Firefighter.  Pt is interested still in going to CIR, and would be appropriate given her challenges of transfers to wheelchair, the issues of navigating in the chair and her fair sit balance with further core control needed.   Follow Up Recommendations  CIR     Equipment Recommendations  Wheelchair (measurements PT);Wheelchair cushion (measurements PT);Other (comment) (anti-tippers and R leg support)    Recommendations for Other Services       Precautions / Restrictions Precautions Precautions: Fall Precaution Comments: L AKA - has prosthetic but does not use ("too heavy") Required Braces or Orthoses: Other Brace Other Brace: R limb protector Restrictions Weight Bearing Restrictions: Yes RLE Weight Bearing: Non weight bearing    Mobility  Bed Mobility Overal bed mobility: Modified Independent                  Transfers Overall transfer level: Needs assistance Equipment used:  (wheelchair) Transfers: Lateral/Scoot Transfers;Anterior-Posterior Wellsite geologist transfers: Min assist  Lateral/Scoot Transfers: Min guard;Min assist General transfer comment: pt required min assist at most to slide laterally into wheelchair, then removed the limb protector to practice sitting with some control.   Min assist to min guard to do AP back to bed then back to chair, and to recliner with limb protector laterally  Ambulation/Gait             General Gait Details: unable   Stairs             Wheelchair Mobility    Modified Rankin (Stroke Patients Only)       Balance Overall balance assessment: Needs assistance Sitting-balance support: No upper extremity supported Sitting balance-Leahy Scale: Fair                                      Cognition Arousal/Alertness: Awake/alert Behavior During Therapy: WFL for tasks assessed/performed Overall Cognitive Status: Within Functional Limits for tasks assessed                                        Exercises      General Comments General comments (skin integrity, edema, etc.): pt is less aware of how the surgery on RLE has changed her balance control.  Talked with her about controlling the sudden forward leans and lateral leans, as well as being mindful of chair being posteriorly unstable with this surgery      Pertinent Vitals/Pain Pain Assessment: 0-10 Faces Pain Scale: Hurts little more Pain Location: R LE Pain Descriptors / Indicators: Guarding Pain Intervention(s): Monitored during session;Premedicated before  session;Repositioned    Home Living                      Prior Function            PT Goals (current goals can now be found in the care plan section) Acute Rehab PT Goals Patient Stated Goal: get home maybe to her mother's Progress towards PT goals: Progressing toward goals    Frequency    Min 3X/week      PT Plan Current plan remains appropriate    Co-evaluation              AM-PAC PT "6 Clicks" Mobility   Outcome Measure  Help needed turning from your back to your side while in a flat bed without using bedrails?: None Help needed moving from lying on your back to sitting on the side of a flat bed without using bedrails?: None Help needed  moving to and from a bed to a chair (including a wheelchair)?: A Little Help needed standing up from a chair using your arms (e.g., wheelchair or bedside chair)?: Total Help needed to walk in hospital room?: Total Help needed climbing 3-5 steps with a railing? : Total 6 Click Score: 14    End of Session Equipment Utilized During Treatment: Gait belt;Other (comment) (R limb protector) Activity Tolerance: Patient tolerated treatment well Patient left: with call bell/phone within reach;in chair;with chair alarm set Nurse Communication: Mobility status PT Visit Diagnosis: Other abnormalities of gait and mobility (R26.89);Muscle weakness (generalized) (M62.81)     Time: [id number] PT Time Calculation (min) (ACUTE ONLY): 42 min  Charges:  $Therapeutic Activity: 23-37 mins $Neuromuscular Re-education: 8-22 mins                   Ramond Dial 02/05/2021, 12:25 PM  Mee Hives, PT MS Acute Rehab Dept. Number: [place] [facility] [place]

## 2021-02-06 DIAGNOSIS — Z992 Dependence on renal dialysis: Secondary | ICD-10-CM | POA: Diagnosis not present

## 2021-02-06 DIAGNOSIS — N186 End stage renal disease: Secondary | ICD-10-CM | POA: Diagnosis not present

## 2021-02-06 DIAGNOSIS — E1129 Type 2 diabetes mellitus with other diabetic kidney complication: Secondary | ICD-10-CM | POA: Diagnosis not present

## 2021-02-06 LAB — CBC
HCT: 25.6 % — ABNORMAL LOW (ref 36.0–46.0)
Hemoglobin: 8.2 g/dL — ABNORMAL LOW (ref 12.0–15.0)
MCH: 29.8 pg (ref 26.0–34.0)
MCHC: 32 g/dL (ref 30.0–36.0)
MCV: 93.1 fL (ref 80.0–100.0)
Platelets: 421 10*3/uL — ABNORMAL HIGH (ref 150–400)
RBC: 2.75 MIL/uL — ABNORMAL LOW (ref 3.87–5.11)
RDW: 15 % (ref 11.5–15.5)
WBC: 7.7 10*3/uL (ref 4.0–10.5)
nRBC: 0 % (ref 0.0–0.2)

## 2021-02-06 LAB — RENAL FUNCTION PANEL
Albumin: 2.5 g/dL — ABNORMAL LOW (ref 3.5–5.0)
Anion gap: 16 — ABNORMAL HIGH (ref 5–15)
BUN: 47 mg/dL — ABNORMAL HIGH (ref 6–20)
CO2: 28 mmol/L (ref 22–32)
Calcium: 10.4 mg/dL — ABNORMAL HIGH (ref 8.9–10.3)
Chloride: 84 mmol/L — ABNORMAL LOW (ref 98–111)
Creatinine, Ser: 8.18 mg/dL — ABNORMAL HIGH (ref 0.44–1.00)
GFR, Estimated: 6 mL/min — ABNORMAL LOW (ref >60)
Glucose, Bld: 219 mg/dL — ABNORMAL HIGH (ref 70–99)
Phosphorus: 6 mg/dL — ABNORMAL HIGH (ref 2.5–4.6)
Potassium: 5.3 mmol/L — ABNORMAL HIGH (ref 3.5–5.1)
Sodium: 128 mmol/L — ABNORMAL LOW (ref 135–145)

## 2021-02-06 LAB — GLUCOSE, CAPILLARY
Glucose-Capillary: 156 mg/dL — ABNORMAL HIGH (ref 70–99)
Glucose-Capillary: 113 mg/dL — ABNORMAL HIGH (ref 70–99)
Glucose-Capillary: 141 mg/dL — ABNORMAL HIGH (ref 70–99)
Glucose-Capillary: 157 mg/dL — ABNORMAL HIGH (ref 70–99)

## 2021-02-06 MED ORDER — CALCIUM ACETATE (PHOS BINDER) 667 MG PO CAPS
2001.0000 mg | ORAL_CAPSULE | Freq: Three times a day (TID) | ORAL | Status: DC
  Administered 2021-02-06 – 2021-02-08 (×6): 2001 mg via ORAL
  Filled 2021-02-06 (×6): qty 3

## 2021-02-06 MED ORDER — OXYCODONE HCL 5 MG PO TABS: 5.0000 mg | ORAL_TABLET | Freq: Four times a day (QID) | ORAL | Status: DC | PRN

## 2021-02-06 NOTE — Progress Notes (Addendum)
[facility] Progress Note   Subjective:     Patient seen and examined at bedside. No complaints at this time. Denies SOB, CP, ABD pain, and N/V/D. Plan for HD today.   Objective Vitals:   02/05/21 0505 02/05/21 1400 02/05/21 2057 02/06/21 0434  BP: (!) 155/73 (!) 157/80 (!) 146/74 (!) 174/80  Pulse: 82 81 75 79  Resp: '16 19 14 18  '$ Temp: 98.5 F (36.9 C) 98.9 F (37.2 C) 97.9 F (36.6 C) 98.2 F (36.8 C)  TempSrc: Oral Oral Oral Oral  SpO2: 100% 100% 96% 97%  Weight:      Height:       Physical Exam General: Chronically ill-appearing; NAD Heart: Normal S1 and S2; No murmurs, gallops, or rubs Lungs: Clear throughout; No wheezing, rhonchi, or rales Abdomen: Soft and non-tender Extremities: No edema L AKA; brace in place for R BKA. Dialysis Access: L AVF (+) Bruit/Thrill   Filed Weights   02/04/21 0500 02/04/21 0808 02/04/21 1157  Weight: 74 kg 74 kg 71 kg    Intake/Output Summary (Last 24 hours) at 02/06/2021 1205 Last data filed at 02/06/2021 0953 Gross per 24 hour  Intake 600 ml  Output --  Net 600 ml    Additional Objective Labs: Basic Metabolic Panel: Recent Labs  Lab 02/04/21 0112 02/05/21 0322 02/06/21 0145  NA 127* 130* 128*  K 5.4* 5.0 5.3*  CL 85* 90* 84*  CO2 '29 27 28  '$ GLUCOSE 201* 132* 219*  BUN 52* 27* 47*  CREATININE 10.16* 6.10* 8.18*  CALCIUM 10.2 9.4 10.4*  PHOS 7.9* 4.7* 6.0*   Liver Function Tests: Recent Labs  Lab 02/04/21 0112 02/05/21 0322 02/06/21 0145  ALBUMIN 2.4* 2.4* 2.5*   No results for input(s): LIPASE, AMYLASE in the last 168 hours. CBC: Recent Labs  Lab 02/02/21 0107 02/03/21 0049 02/04/21 0112 02/05/21 0322 02/06/21 0145  WBC 8.9 9.5 9.6 8.3 7.7  HGB 9.3* 9.0* 8.6* 8.1* 8.2*  HCT 29.7* 28.0* 27.0* 26.1* 25.6*  MCV 96.1 95.2 94.4 95.3 93.1  PLT 321 324 350 359 421*   Blood Culture    Component Value Date/Time   SDES BLOOD RIGHT ANTECUBITAL 01/28/2021 1812   SPECREQUEST  01/28/2021 1812     BOTTLES DRAWN AEROBIC AND ANAEROBIC Blood Culture adequate volume   CULT  01/28/2021 1812    NO GROWTH 5 DAYS Performed at [facility], [address] [place], [place] [address]    REPTSTATUS 02/02/2021 FINAL 01/28/2021 1812    Cardiac Enzymes: No results for input(s): CKTOTAL, CKMB, CKMBINDEX, TROPONINI in the last 168 hours. CBG: Recent Labs  Lab 02/05/21 1151 02/05/21 1713 02/05/21 2052 02/06/21 0809 02/06/21 1143  GLUCAP 151* 108* 153* 156* 113*   Iron Studies: No results for input(s): IRON, TIBC, TRANSFERRIN, FERRITIN in the last 72 hours. Lab Results  Component Value Date   INR 1.3 (H) 01/28/2021   INR 1.1 05/03/2019   INR 1.2 02/06/2019   Studies/Results: No results found.  Medications:  sodium chloride     sodium chloride     sodium chloride     iron sucrose 100 mg (02/04/21 1149)   magnesium sulfate bolus IVPB      (feeding supplement) PROSource Plus  30 mL Oral BID BM   aspirin EC  81 mg Oral Daily   atorvastatin  40 mg Oral Daily   bictegravir-emtricitabine-tenofovir AF  1 tablet Oral Daily   calcitRIOL  1 mcg Oral Daily   calcium acetate  1,334 mg Oral TID WC   Chlorhexidine Gluconate Cloth  6 each Topical Daily   darbepoetin (ARANESP) injection - DIALYSIS  100 mcg Intravenous Q Thu-HD   feeding supplement (NEPRO CARB STEADY)  237 mL Oral BID BM   gabapentin  100 mg Oral Once per day on Tue Thu Sat   heparin  5,000 Units Subcutaneous Q8H   insulin aspart  0-15 Units Subcutaneous TID WC   insulin glargine  5 Units Subcutaneous QHS   multivitamin  1 tablet Oral QHS   pantoprazole  40 mg Oral Daily   polyethylene glycol  17 g Oral BID   senna  2 tablet Oral BID   sertraline  50 mg Oral QHS   sodium chloride flush  3 mL Intravenous Q12H    Dialysis Orders: [facility]-TTS 4h   75 kg    400/1.5    2/ 2.0 bath  AVF Hep 3000 -Calcitriol 1.25 mcg p.o. q. 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 ESRD: HD TTS-plan for HD today Hypertension/volume: BP good now, under prior edw which is typical for a BKA.  No vol ^ on exam-will lower EDW at discharge. Anemia of ESRD: Hgb now 8.2.-ESA dose scheduled to be given with HD 6/30.Transfuse if needed. Resume Fe load post BKA.  Metabolic bone disease: PO4 slightly high-raised to 3 tabs-phoslo. Corr Ca greater than 10-dose lowered 6/26-will monitor trend-may need to lower calcitriol more. Nutrition: Albumin low. renal carb modified diet albumin today protein supplement with prosource. HIV-meds per primary Diabetes mellitus type 2-per primary Depressive disorder on sertraline per primary Disposition: Informed by rehab admission coordinator of insurance appeal for CIR admit began 02/05/21-awaiting their final decision-in meantime, rehab venues need to also be pursued in preparation for discharge.  Tobie Poet, NP [facility] 02/06/2021,12:05 PM  LOS: 8 days

## 2021-02-06 NOTE — Progress Notes (Signed)
Inpatient Rehabilitation Admissions Coordinator   Insurance appeal for CIR admit begun yesterday. We await their final determination. Other rehab venues need to also be pursued. Acute team and TOC are aware.  Danne Baxter, RN, MSN Rehab Admissions Coordinator [phone] 02/06/2021 11:03 AM

## 2021-02-06 NOTE — Progress Notes (Signed)
   Subjective: Patient was seen this morning.  Patient states she is better and her pain is controlled.  Still feels constipated and have not had bowel movement after enema yesterday.  Objective: Vital signs in last 24 hours: Vitals:   02/05/21 0505 02/05/21 1400 02/05/21 2057 02/06/21 0434  BP: (!) 155/73 (!) 157/80 (!) 146/74 (!) 174/80  Pulse: 82 81 75 79  Resp: '16 19 14 18  '$ Temp: 98.5 F (36.9 C) 98.9 F (37.2 C) 97.9 F (36.6 C) 98.2 F (36.8 C)  TempSrc: Oral Oral Oral Oral  SpO2: 100% 100% 96% 97%  Weight:      Height:         Physical Exam Constitutional: Patient lying in bed comfortably.  Not in acute distress. cardiovascular: Regular rate and rhythm, normal heart sounds.  No murmurs. pulmonary: Effort normal, breathing comfortably at room air. Abdominal: Flat and nondistended. Musculoskeletal: Right leg amputated below knee in dressing.  Left AKA. Skin warm and dry.   Neurological: Alert and oriented, no focal deficits.   Psychiatric: Normal mood and affect.  Principal Problem:   Gangrene of right foot ([place]) Active Problems:   Diabetes mellitus type 2 in obese ([place])   Human immunodeficiency virus (HIV) disease ([place])   ESRD (end stage renal disease) ([place])   Anemia due to chronic kidney disease   Peripheral arterial disease ([place])  Kathy Frank is a 45 year old female admitted for worsening right foot pain in the setting of gangrene secondary to severe PAD. 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  Patient medically stable waiting for CIR placement (insurance denied, appealed, may take 3 to 4 days.)  S/p Rt BKA for gangrene by Dr. Chaya Jan (01/31/21) Patient states her pain is controlled now.  Denies any new complaints. -Reduce pain control with oxycodone to 5-10 mg  PRN Q6h -Continue Dilaudid 0.5 mg to Q6H PRN for breakthrough pain. -Continue gabapentin 100 mg 3 times per week after hemodialysis on HD days only. -TOC working on [facility] placement.  Denied by insurance, appeal in process  which may take 3 to 4 days. SNF as backup. CSW aware.   #ESRD, Tuesdays, Thursdays, Saturday -HD per nephrology  #Type 2 diabetes  -Continue CBGs, Lantus 5 units ,SSI for diabetes.  #Constipation, iatrogenic  Have not had bowel movement after enema yesterday.  Likely secondary to pain medications.  No nausea, vomiting.  Received 1 soapsuds enema yesterday, also on senna and MiraLAX.  -Repeat soap suds enema again today. -Continue senna 2 tablets BID - Miralax BID  PT/OT-  CIR  Dispo: Patient medically stable waiting for CIR placement. Appeal in process, may need 3 to 4 days. SNF as backup. CSW aware.   Armando Reichert, MD 02/06/2021, 8:18 AM Pager: [phone] After 5pm on weekdays and 1pm on weekends: Please call On Call pager [phone]

## 2021-02-06 NOTE — Progress Notes (Signed)
Occupational Therapy Treatment Patient Details Name: Kathy Frank MRN: [id number] DOB: 10-25-1975 Today's Date: 02/06/2021    History of present illness 45 y/o female presented to ED on 6/22 for R foot pain and difficulty weight bearing with concerns of gangrene. 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 PMH: anxiety, ESRD on HD, depression, HTN, HIV, type 2 DM, L AKA   OT comments  Pt progressing well with OT goals. This session, pt was very motivated to move, she completed lateral scoots up and down the bed x3 with no assist and maintained good balance the entire time. Additionally, pt demonstrated improved activity tolerance, allowing her to complete exercises and grooming sitting EOB for 20 mins. Pt very motivated to continue therapies to improve her mobility and allow her to return to independence as soon as possible.  Acute OT will continue to follow.    Follow Up Recommendations  CIR    Equipment Recommendations  3 in 1 bedside commode;Wheelchair (measurements OT)    Recommendations for Other Services Rehab consult    Precautions / Restrictions Precautions Precautions: Fall Precaution Comments: L AKA - has prosthetic but does not use ("too heavy") Required Braces or Orthoses: Other Brace Other Brace: R limb protector Restrictions Weight Bearing Restrictions: Yes RLE Weight Bearing: Non weight bearing      Mobility Bed Mobility Overal bed mobility: Modified Independent                  Transfers Overall transfer level: Needs assistance Equipment used: None Transfers: Lateral/Scoot Transfers          Lateral/Scoot Transfers: Min guard General transfer comment: Pt scooted up and down the length of the bed x3    Balance Overall balance assessment: No apparent balance deficits (not formally assessed)                                         ADL either performed or assessed with clinical judgement   ADL Overall ADL's : Needs assistance/impaired      Grooming: Wash/dry hands;Wash/dry face;Sitting                   Toilet Transfer: Physicist, medical;Requires drop arm           Functional mobility during ADLs: Min guard General ADL Comments: Pt requested to remain in the bed due to leaving for dialysis soon. performed grooming EOB, and lateral scoots along EOB.     Vision   Vision Assessment?: No apparent visual deficits   Perception     Praxis      Cognition Arousal/Alertness: Awake/alert Behavior During Therapy: WFL for tasks assessed/performed Overall Cognitive Status: Within Functional Limits for tasks assessed                                          Exercises     Shoulder Instructions       General Comments VSS on RA    Pertinent Vitals/ Pain       Pain Assessment: No/denies pain  Home Living                                          Prior Functioning/Environment  Frequency  Min 2X/week        Progress Toward Goals  OT Goals(current goals can now be found in the care plan section)  Progress towards OT goals: Progressing toward goals  Acute Rehab OT Goals Patient Stated Goal: Rehab OT Goal Formulation: With patient Time For Goal Achievement: 02/15/21 Potential to Achieve Goals: Good ADL Goals Pt Will Transfer to Toilet: with modified independence;with transfer board Pt Will Perform Tub/Shower Transfer: with modified independence;with transfer board Pt/caregiver will Perform Home Exercise Program: Increased strength;Both right and left upper extremity;With theraband;With theraputty;Independently;With written HEP provided  Plan Discharge plan remains appropriate    Co-evaluation                 AM-PAC OT "6 Clicks" Daily Activity     Outcome Measure   Help from another person eating meals?: None Help from another person taking care of personal grooming?: A Little Help from another person toileting, which includes  using toliet, bedpan, or urinal?: A Little Help from another person bathing (including washing, rinsing, drying)?: A Little Help from another person to put on and taking off regular upper body clothing?: A Little Help from another person to put on and taking off regular lower body clothing?: A Little 6 Click Score: 19    End of Session    OT Visit Diagnosis: Unsteadiness on feet (R26.81);Other abnormalities of gait and mobility (R26.89);Muscle weakness (generalized) (M62.81)   Activity Tolerance Patient tolerated treatment well   Patient Left in bed;with call bell/phone within reach;with bed alarm set   Nurse Communication Mobility status        Time: 1130-1153 OT Time Calculation (min): 23 min  Charges: OT General Charges $OT Visit: 1 Visit OT Treatments $Self Care/Home Management : 8-22 mins $Therapeutic Activity: 8-22 mins  Shacora Zynda H., OTR/L Acute Rehabilitation   Oliwia Berzins Elane Tania Steinhauser 02/06/2021, 2:18 PM

## 2021-02-06 NOTE — Progress Notes (Signed)
Physical Therapy Treatment Patient Details Name: Kathy Frank MRN: [id number] DOB: 1976-04-11 Today's Date: 02/06/2021    History of Present Illness 45 y/o female presented to ED on 6/22 for R foot pain and difficulty weight bearing with concerns of gangrene. 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 PMH: anxiety, ESRD on HD, depression, HTN, HIV, type 2 DM, L AKA    PT Comments    Pt was seen for bed level balance skills, catching a light roll to challenge her core and response to balance shifts.  Pt then reviewed some basic exercises within her surgical tolerance for BLE's, and was able to follow instructions well.  Follow along with her to work on wheelchair again next session, to practice transfers and control of balance with the limited base of support.  Talked with pt about rehab plans that are ongoing and still recommend her to CIR for challenge of balancing with her wheelchair, to increase safety and independence on a lighter chair for transfers.   Follow Up Recommendations  CIR     Equipment Recommendations  Wheelchair (measurements PT);Wheelchair cushion (measurements PT);Other (comment)    Recommendations for Other Services       Precautions / Restrictions Precautions Precautions: Fall Precaution Comments: L AKA - has prosthetic but does not use ("too heavy") Required Braces or Orthoses: Other Brace Other Brace: R limb protector Restrictions Weight Bearing Restrictions: Yes RLE Weight Bearing: Non weight bearing    Mobility  Bed Mobility Overal bed mobility: Modified Independent                  Transfers Overall transfer level: Needs assistance Equipment used: None Transfers: Lateral/Scoot Transfers          Lateral/Scoot Transfers: Min guard General transfer comment: did not perform today  Ambulation/Gait             General Gait Details: unable   Stairs             Wheelchair Mobility    Modified Rankin (Stroke Patients Only)        Balance Overall balance assessment: Needs assistance Sitting-balance support:  (long sits on the bed) Sitting balance-Leahy Scale: Fair                                      Cognition Arousal/Alertness: Awake/alert Behavior During Therapy: WFL for tasks assessed/performed Overall Cognitive Status: Within Functional Limits for tasks assessed                                        Exercises General Exercises - Lower Extremity Quad Sets: 10 reps;Right Gluteal Sets: Both;10 reps Hip ABduction/ADduction: AROM;10 reps    General Comments General comments (skin integrity, edema, etc.): VSS on RA      Pertinent Vitals/Pain Pain Assessment: No/denies pain    Home Living                      Prior Function            PT Goals (current goals can now be found in the care plan section) Acute Rehab PT Goals Patient Stated Goal: Rehab    Frequency    Min 3X/week      PT Plan Current plan remains appropriate    Co-evaluation  AM-PAC PT "6 Clicks" Mobility   Outcome Measure  Help needed turning from your back to your side while in a flat bed without using bedrails?: None Help needed moving from lying on your back to sitting on the side of a flat bed without using bedrails?: None Help needed moving to and from a bed to a chair (including a wheelchair)?: A Little Help needed standing up from a chair using your arms (e.g., wheelchair or bedside chair)?: Total Help needed to walk in hospital room?: Total Help needed climbing 3-5 steps with a railing? : Total 6 Click Score: 14    End of Session Equipment Utilized During Treatment: Other (comment) (R limb protector) Activity Tolerance: Patient tolerated treatment well Patient left: with call bell/phone within reach;in chair;with chair alarm set Nurse Communication: Mobility status PT Visit Diagnosis: Other abnormalities of gait and mobility (R26.89);Muscle weakness  (generalized) (M62.81)     Time: [id number] PT Time Calculation (min) (ACUTE ONLY): 25 min  Charges:  $Therapeutic Exercise: 8-22 mins $Neuromuscular Re-education: 8-22 mins                  Ramond Dial 02/06/2021, 4:10 PM  Mee Hives, PT MS Acute Rehab Dept. Number: [place]

## 2021-02-07 DIAGNOSIS — Z992 Dependence on renal dialysis: Secondary | ICD-10-CM | POA: Diagnosis not present

## 2021-02-07 DIAGNOSIS — N186 End stage renal disease: Secondary | ICD-10-CM

## 2021-02-07 DIAGNOSIS — I120 Hypertensive chronic kidney disease with stage 5 chronic kidney disease or end stage renal disease: Secondary | ICD-10-CM | POA: Diagnosis not present

## 2021-02-07 DIAGNOSIS — D631 Anemia in chronic kidney disease: Secondary | ICD-10-CM | POA: Diagnosis not present

## 2021-02-07 DIAGNOSIS — N2581 Secondary hyperparathyroidism of renal origin: Secondary | ICD-10-CM | POA: Diagnosis not present

## 2021-02-07 DIAGNOSIS — I96 Gangrene, not elsewhere classified: Secondary | ICD-10-CM | POA: Diagnosis not present

## 2021-02-07 LAB — RENAL FUNCTION PANEL
Albumin: 2.6 g/dL — ABNORMAL LOW (ref 3.5–5.0)
Anion gap: 12 (ref 5–15)
BUN: 25 mg/dL — ABNORMAL HIGH (ref 6–20)
CO2: 27 mmol/L (ref 22–32)
Calcium: 9.1 mg/dL (ref 8.9–10.3)
Chloride: 93 mmol/L — ABNORMAL LOW (ref 98–111)
Creatinine, Ser: 5.36 mg/dL — ABNORMAL HIGH (ref 0.44–1.00)
GFR, Estimated: 10 mL/min — ABNORMAL LOW (ref >60)
Glucose, Bld: 146 mg/dL — ABNORMAL HIGH (ref 70–99)
Phosphorus: 3.4 mg/dL (ref 2.5–4.6)
Potassium: 4.3 mmol/L (ref 3.5–5.1)
Sodium: 132 mmol/L — ABNORMAL LOW (ref 135–145)

## 2021-02-07 LAB — GLUCOSE, CAPILLARY
Glucose-Capillary: 172 mg/dL — ABNORMAL HIGH (ref 70–99)
Glucose-Capillary: 135 mg/dL — ABNORMAL HIGH (ref 70–99)
Glucose-Capillary: 142 mg/dL — ABNORMAL HIGH (ref 70–99)
Glucose-Capillary: 117 mg/dL — ABNORMAL HIGH (ref 70–99)

## 2021-02-07 MED ORDER — HYDROMORPHONE HCL 1 MG/ML IJ SOLN: 0.5000 mg | Freq: Three times a day (TID) | INTRAMUSCULAR | Status: DC | PRN

## 2021-02-07 MED ORDER — CAMPHOR-MENTHOL 0.5-0.5 % EX LOTN
1.0000 "application " | TOPICAL_LOTION | CUTANEOUS | Status: DC | PRN
  Filled 2021-02-07 (×2): qty 222

## 2021-02-07 MED ORDER — CLOPIDOGREL BISULFATE 75 MG PO TABS
75.0000 mg | ORAL_TABLET | Freq: Every day | ORAL | Status: DC
  Administered 2021-02-07 – 2021-02-08 (×2): 75 mg via ORAL
  Filled 2021-02-07: qty 1

## 2021-02-07 MED ORDER — BISACODYL 5 MG PO TBEC
5.0000 mg | DELAYED_RELEASE_TABLET | Freq: Every day | ORAL | Status: DC
  Administered 2021-02-08: 5 mg via ORAL
  Filled 2021-02-07 (×2): qty 1

## 2021-02-07 MED ORDER — OXYCODONE HCL 5 MG PO TABS: 5.0000 mg | ORAL_TABLET | Freq: Four times a day (QID) | ORAL | Status: DC | PRN

## 2021-02-07 MED ORDER — CHLORHEXIDINE GLUCONATE CLOTH 2 % EX PADS
6.0000 | MEDICATED_PAD | Freq: Every day | CUTANEOUS | Status: DC
  Administered 2021-02-08: 6 via TOPICAL

## 2021-02-07 MED ORDER — OXYCODONE HCL 5 MG PO TABS
5.0000 mg | ORAL_TABLET | Freq: Four times a day (QID) | ORAL | Status: DC | PRN
  Administered 2021-02-07 – 2021-02-08 (×2): 5 mg via ORAL
  Filled 2021-02-07 (×2): qty 1

## 2021-02-07 MED ORDER — DARBEPOETIN ALFA 100 MCG/0.5ML IJ SOSY
100.0000 ug | PREFILLED_SYRINGE | INTRAMUSCULAR | Status: DC
  Filled 2021-02-07: qty 0.5

## 2021-02-07 MED ORDER — AMLODIPINE BESYLATE 5 MG PO TABS
5.0000 mg | ORAL_TABLET | Freq: Every day | ORAL | Status: DC
  Administered 2021-02-07 – 2021-02-08 (×2): 5 mg via ORAL
  Filled 2021-02-07 (×2): qty 1

## 2021-02-07 NOTE — Progress Notes (Signed)
Patient returned from hemodialysis alert and oriented, no complaints voiced. Settled into room, VS taken. Will continue to monitor.

## 2021-02-07 NOTE — Progress Notes (Signed)
   Subjective: Patient was seen this morning during rounds. Patient states that her pain has improved overall since her admission. Pt still has not had bowel movement after enema on 06/29. She reports that she has been passing gas and denies abdominal pain today.  Objective: Vital signs in last 24 hours: Vitals:   02/07/21 0030 02/07/21 0052 02/07/21 0141 02/07/21 0423  BP: 130/63 (!) 168/94 138/65 (!) 173/81  Pulse: 87 91 88 83  Resp: '16 16 16 18  '$ Temp:  98 F (36.7 C)  99.3 F (37.4 C)  TempSrc:  Oral  Oral  SpO2:  97%  100%  Weight:  71.8 kg 71.8 kg   Height:         Physical Exam Constitutional: Patient seated comfortably in chair. NAD Cardiovascular: Regular rate and rhythm, normal heart sounds. No murmurs. No edema. Pulmonary: Effort normal, breathing comfortably at room air. Abdominal: Soft, nondistended, non tender Musculoskeletal: Black sock over stump. Right BKA incision is clean, dry, and intact. Left AKA. Skin Warm and dry.   Neurological: Alert and oriented, no focal deficits.   Psychiatric: Normal mood and affect.  Principal Problem:   Gangrene of right foot ([name]) Active Problems:   Diabetes mellitus type 2 in obese ([name])   Human immunodeficiency virus (HIV) disease ([place])   ESRD (end stage renal disease) ([name])   Anemia due to chronic kidney disease   Peripheral arterial disease ([name])  Kathy Frank is a 45 y.o. female with past medical history significant for type 2 diabetes, ESRD on dialysis, history of left AK amputation and history of severe PAD, HIV. She was admitted on 06/22 for worsening right foot pain in the setting of gangrene secondary to severe PAD. 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  Patient medically stable for CIR placement (insurance denied, appealed, may take 2-3 more days).   S/p Rt BKA for gangrene by Dr. Chaya Jan (01/31/21) Patient states her pain is controlled now. She denies any new complaints. -Continue pain control with oxycodone to 5-10 mg PRN  Q6h - Dilaudid 0.5 mg to Q8H PRN for breakthrough pain. -Continue gabapentin 100 mg 3 times per week after hemodialysis on HD days only. - Vascular following; cleared to discharge from their standpoint - Restarted home Plavix 75 mg PO daily  #ESRD, Tuesdays, Thursdays, Saturday -HD per nephrology  #Type 2 diabetes  -Continue CBGs, Lantus 5 units, SSI for diabetes.  #Constipation, iatrogenic  Pt still has not had bowel movement after enema on 06/29. She reports that she has been passing gas. No nausea, vomiting abdominal pain. Also on senna and MiraLAX.  - Dulcolax 5 mg PO daily today -Continue senna 2 tablets BID - Miralax BID  PT/OT-  CIR (pending) Dispo: Patient medically stable waiting for CIR placement. Appeal in process, may need 2 to 3 more days. SNF as backup. CSW aware.   Mitzie [name], MD 02/07/2021, 1:03 PM Pager: [phone] After 5pm on weekdays and 1pm on weekends: Please call On Call pager [phone]

## 2021-02-07 NOTE — Discharge Summary (Signed)
Name: Kathy Frank MRN: [id number] DOB: 1976-07-22 45 y.o. PCP: Kathy Beard, MD  Date of Admission: 01/28/2021  5:16 PM Date of Discharge: 02/08/2021 Attending Physician: Kathy Ochs, MD  Discharge Diagnosis: Right foot gangrene, s/p right Below the Knee Amputation  Discharge Medications: Allergies as of 02/08/2021   No Known Allergies      Medication List     STOP taking these medications    doxycycline 100 MG tablet Commonly known as: VIBRA-TABS   mupirocin ointment 2 % Commonly known as: BACTROBAN   ondansetron 4 MG disintegrating tablet Commonly known as: Zofran ODT   promethazine 25 MG tablet Commonly known as: PHENERGAN   Tums 500 MG chewable tablet Generic drug: calcium carbonate   Vitamin D (Ergocalciferol) 50000 units Caps       TAKE these medications    (feeding supplement) PROSource Plus liquid Take 30 mLs by mouth 2 (two) times daily between meals.   Accu-Chek FastClix Lancets Misc Check blood sugar up to 3 times a day   Accu-Chek SmartView test strip Generic drug: glucose blood Check blood sugar 4 times a day as instructed   albuterol 108 (90 Base) MCG/ACT inhaler Commonly known as: VENTOLIN HFA Inhale 2 puffs into the lungs every 4 (four) hours as needed for wheezing or shortness of breath.   amLODipine 5 MG tablet Commonly known as: NORVASC Take 1 tablet (5 mg total) by mouth daily. Start taking on: February 09, 2021   aspirin EC 81 MG tablet Take 1 tablet (81 mg total) by mouth daily. Swallow whole.   atorvastatin 40 MG tablet Commonly known as: Lipitor Take 1 tablet (40 mg total) by mouth daily.   Biktarvy 50-200-25 MG Tabs tablet Generic drug: bictegravir-emtricitabine-tenofovir AF TAKE 1 TABLET BY MOUTH DAILY. What changed:  when to take this additional instructions   bisacodyl 5 MG EC tablet Commonly known as: DULCOLAX Take 1 tablet (5 mg total) by mouth daily. Start taking on: February 09, 2021   calcitRIOL 0.25 MCG  capsule Commonly known as: ROCALTROL Take 5 capsules (1.25 mcg total) by mouth daily.   calcium acetate 667 MG capsule Commonly known as: PHOSLO Take 3 capsules (2,001 mg total) by mouth 3 (three) times daily with meals. What changed: how much to take   clopidogrel 75 MG tablet Commonly known as: PLAVIX Take 1 tablet (75 mg total) by mouth daily.   diphenhydrAMINE 50 MG tablet Commonly known as: BENADRYL Take 0.5 tablets (25 mg total) by mouth every 6 (six) hours as needed for itching (As needed for itching).   gabapentin 100 MG capsule Commonly known as: NEURONTIN Take 1 capsule (100 mg total) by mouth 3 (three) times a week.   HEPARIN SODIUM (PORCINE) IV With hemodialysis   insulin glargine 100 UNIT/ML Solostar Pen Commonly known as: LANTUS Inject 5 Units into the skin at bedtime. What changed: how much to take   iron sucrose in sodium chloride 0.9 % 100 mL Q week with dialysis   loperamide 2 MG tablet Commonly known as: IMODIUM A-D Take 2 mg by mouth 4 (four) times daily as needed for diarrhea or loose stools.   MIRCERA IJ Mircera   oxyCODONE-acetaminophen 10-325 MG tablet Commonly known as: Percocet Take 1 tablet by mouth every 6 (six) hours as needed for pain.   pantoprazole 40 MG tablet Commonly known as: PROTONIX Take 1 tablet (40 mg total) by mouth daily. Start taking on: February 09, 2021   Rena-Vite Rx 1 MG Tabs Take  1 tablet by mouth daily.   sertraline 50 MG tablet Commonly known as: Zoloft Take 1 tablet (50 mg total) by mouth at bedtime. What changed: when to take this               Discharge Care Instructions  (From admission, onward)           Start     Ordered   01/30/21 0000  No dressing needed        01/30/21 1542            Disposition and follow-up:   Kathy Frank was discharged from [facility] in Stable condition.  At the hospital follow up visit please address:  1.  Right BKA: Small area of  wound dehiscence, monitor daily , but expect this to continue to heal.   2.  Labs / imaging needed at time of follow-up: none  3.  Pending labs/ test needing follow-up: none  Follow-up Appointments:  Follow-up Information     [facility] Follow up in 4 week(s).   Specialty: Vascular Surgery Why: sent Contact information: [address] [place] [facility] Course by problem list: 1. 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 Chaya Frank (01/31/21). Gangrenous wound of fourth and fifth metatarsal secondary to severe PAD.No evidence of infectious cause at this time. No purulent discharge of wound.  Patient was afebrile on admission with normal white count and lactic acid. Right foot x-ray did not show any bony involvement.  MRI of right foot shows diffuse cellulitis and myositis but no obvious discrete subcutaneous abscess or pyomyositis and no findings of septic arthritis or osteomyelitis. Aspirin and Plavix stopped before surgery. Vascular surgery consulted,  Recommended BKA. Pt had Rt BKA for gangrene by Dr. Chaya Frank (01/31/21). Aspirin and Plavix restarted. PT recommended CIR placement. Pt's disposition got delayed because of denial from insurance, followed by Appeal and patient accepted at [facility]. On day of discharge she had some mild wound dehiscence picture as below, not intervention needed at this time unless it worsens.    ESRD on HD TTS Pt received dialysis per schedule.   Subjective: Patient reports she is feeling a little down this morning waiting on answer on rehab.She also reports her stump dressing was changes and noticed some drainage. No trauma to stump.   Addendum: After rounding on patient we found out she was being discharged today.   Discharge Exam:   BP (!) 158/70 (BP Location: Left Arm)   Pulse 91   Temp 98.4 F (36.9 C) (Oral)   Resp 18   Ht '5\' 5"'$  (1.651  m)   Wt 71.8 kg   SpO2 99%   BMI 26.34 kg/m  Discharge exam:  Constitutional: Patient sitting in bed, NAD Cardiovascular: Regular rate and rhythm, normal heart sounds. No murmurs. No edema. Pulmonary: Effort normal, breathing comfortably at room air. Abdominal: Soft, nondistended, non tender Musculoskeletal:  Serosanguinous drainage, mild wound dehiscence  on left side of staples line, as seen in picture above.   Psychiatric: depressed mood and affect. (This was before knowing she was approved for CIR)  Pertinent Labs, Studies, and Procedures:  CBC Latest Ref Rng & Units 02/06/2021 02/05/2021 02/04/2021  WBC 4.0 - 10.5 K/uL 7.7 8.3 9.6  Hemoglobin 12.0 - 15.0 g/dL 8.2(L) 8.1(L) 8.6(L)  Hematocrit 36.0 - 46.0 %  25.6(L) 26.1(L) 27.0(L)  Platelets 150 - 400 K/uL 421(H) 359 350   CMP Latest Ref Rng & Units 02/08/2021 02/07/2021 02/06/2021  Glucose 70 - 99 mg/dL 115(H) 146(H) 219(H)  BUN 6 - 20 mg/dL 40(H) 25(H) 47(H)  Creatinine 0.44 - 1.00 mg/dL 7.36(H) 5.36(H) 8.18(H)  Sodium 135 - 145 mmol/L 130(L) 132(L) 128(L)  Potassium 3.5 - 5.1 mmol/L 4.7 4.3 5.3(H)  Chloride 98 - 111 mmol/L 91(L) 93(L) 84(L)  CO2 22 - 32 mmol/L '24 27 28  '$ Calcium 8.9 - 10.3 mg/dL 9.9 9.1 10.4(H)  Total Protein 6.5 - 8.1 g/dL - - -  Total Bilirubin 0.3 - 1.2 mg/dL - - -  Alkaline Phos 38 - 126 U/L - - -  AST 15 - 41 U/L - - -  ALT 0 - 44 U/L - - -   DG Chest 1 View  Result Date: 01/28/2021 CLINICAL DATA:  Questionable sepsis - evaluate for abnormality Patient reports right foot pain. EXAM: CHEST  1 VIEW COMPARISON:  Radiograph 06/17/2020 FINDINGS: Stable cardiomegaly. Unchanged mediastinal contours. Aortic atherosclerosis. No focal airspace disease, pulmonary edema, pleural effusion or pneumothorax. No acute osseous abnormalities are seen. IMPRESSION: Stable cardiomegaly. No acute chest findings. Electronically Signed   By: Keith Rake M.D.   On: 01/28/2021 19:57   MR FOOT RIGHT WO CONTRAST  Result Date:  01/29/2021 CLINICAL DATA:  Diabetic on dialysis with foot pain and swelling. EXAM: MRI OF THE RIGHT FOREFOOT WITHOUT CONTRAST TECHNIQUE: Multiplanar, multisequence MR imaging of the right foot was performed. No intravenous contrast was administered. COMPARISON:  Radiographs 01/28/2021 FINDINGS: Examination is quite limited due to patient motion. There is subcutaneous soft tissue swelling/edema suggesting cellulitis. There is also diffuse myositis involving the forefoot musculature. No obvious discrete fluid collections to suggest subcutaneous abscess or pyomyositis. Grossly the bony structures are intact. I do not see any obvious findings for septic arthritis or osteomyelitis but the exam is quite limited. IMPRESSION: 1. Very limited examination due to patient motion. 2. Diffuse cellulitis and myositis but no obvious discrete subcutaneous abscess or pyomyositis. 3. No obvious findings for septic arthritis or osteomyelitis. Electronically Signed   By: Marijo Sanes M.D.   On: 01/29/2021 08:41   DG Foot Complete Right  Result Date: 01/28/2021 CLINICAL DATA:  Questionable sepsis - evaluate for abnormality Right foot blistering, being followed by podiatry. EXAM: RIGHT FOOT COMPLETE - 3+ VIEW COMPARISON:  Most recent comparison radiograph 09/08/2019 FINDINGS: There is no evidence of fracture or dislocation. No erosion or bone destruction. Similar well-marginated defect about the medial aspect of the first toe proximal phalanx. Age advanced vascular calcifications. Dorsal soft tissue edema. There is air in the soft tissues medial to the great toe distal phalanx. No radiopaque foreign body. IMPRESSION: 1. Air in the soft tissues medial to the great toe distal phalanx, may represent wound or soft tissue infection. No radiopaque foreign body or radiographic findings of osteomyelitis. 2. Dorsal soft tissue edema. Age advanced vascular calcifications. Electronically Signed   By: Keith Rake M.D.   On: 01/28/2021 19:59      Discharge Instructions: Discharge Instructions     Call MD for:  extreme fatigue   Complete by: As directed    Call MD for:  severe uncontrolled pain   Complete by: As directed    Call MD for:  temperature >100.4   Complete by: As directed    Diet - low sodium heart healthy   Complete by: As directed    Increase activity  slowly   Complete by: As directed    Increase activity slowly   Complete by: As directed    No dressing needed   Complete by: As directed    No wound care   Complete by: As directed        Signed: [name], MD 02/08/2021, 11:52 AM   Pager: [phone]

## 2021-02-07 NOTE — H&P (Signed)
Physical Medicine and Rehabilitation Admission H&P     HPI: Kathy Frank is a 45 year old right-handed female with history of type 2 diabetes mellitus, end-stage renal disease with hemodialysis, anxiety, HIV, quit smoking 6 years ago, left AKA 2020, PVD with multiple revascularization procedures maintained on aspirin and Plavix..  Per chart review patient lives with her children.  1 level home 4 steps to entry.  Use a rolling walker and mobilization as well as wheelchair.  Presented 01/28/2021 with progressive right foot rest pain and nonhealing wounds.  Patient has undergone revascularization procedures to right lower extremity in the past.  Noted progressive ischemic changes limb was not felt to be salvageable underwent right BKA 01/31/2021 per Dr. Trula Slade.  [facility] course pain management.  Hemodialysis ongoing as per renal services.  Subcutaneous heparin for DVT prophylaxis.  Acute on chronic anemia latest hemoglobin 8.2.  Therapy evaluations completed due to patient decreased functional mobility was admitted for a comprehensive rehab program.  Review of Systems  Constitutional:  Negative for chills and fever.  HENT:  Negative for hearing loss.   Eyes:  Negative for blurred vision and double vision.  Respiratory:  Negative for cough and shortness of breath.   Cardiovascular:  Positive for leg swelling. Negative for chest pain and palpitations.  Gastrointestinal:  Positive for constipation. Negative for heartburn, nausea and vomiting.       GERD  Genitourinary:  Negative for flank pain and hematuria.  Musculoskeletal:  Positive for joint pain and myalgias.  Skin:  Negative for rash.  Psychiatric/Behavioral:  Positive for depression.        Anxiety  All other systems reviewed and are negative. Past Medical History:  Diagnosis Date   Anal abscess    chronic   Anxiety    CKD (chronic kidney disease) stage 5, GFR less than 15 ml/min (HCC) 06/19/2016   . Suspect etiologies to include  diabetes and hypertension though cannot exclude HIV-associated nephropathy. Advised her against from using excessive Goody powder.   Constipation    trouble stoll coming out   Depression 06/28/2006   Qualifier: Diagnosis of  By: Riccardo Dubin MD, Todd     Diabetes mellitus type 2 in obese ([place]) 06/28/1994   DKA (diabetic ketoacidoses) 12/2014   Dyspnea    uses oxygeb 2 liters per minute at dialysis   End stage renal disease on dialysis [facility])    Erosive esophagitis    Esophageal reflux    ESRD on hemodialysis ([place]) 05/02/2019   Eye redness    Gastroparesis    ? diabetic   GERD (gastroesophageal reflux disease)    History of blood transfusion 2019   History of hypertension    off bp meds 2020 due to low bp with dialysis   Human immunodeficiency virus (HIV) disease ([place]) [id number]   Metabolic bone disease [id number]   Moderate nonproliferative diabetic retinopathy of both eyes ([place]) 11/21/2014   11/14/14: Noted on retinal imaging; needs follow-up imaging in 6 months  05/22/16: Noted on retinal imaging again; needs follow-up imaging in 6 months   Normocytic anemia    Pruritus, unspecified 05/20/2019   Renal failure (ARF), acute on chronic ([place]) 11/24/2018   tues, thurs sat [address] hs since sept 2020   Shortness of breath 07/01/2019   when lays doan at night, musy prop head up on 2 regular and 2 small pillows   Type 2 diabetes mellitus with diabetic peripheral angiopathy without gangrene ([place]) 05/01/2019   Type II diabetes mellitus ([place])  1994   diagnosed around 1994   Wears dentures    lower   Wound dehiscence    Wound infection s/p L transmetatarsal amputation    Past Surgical History:  Procedure Laterality Date   ABDOMINAL AORTOGRAM W/LOWER EXTREMITY N/A 12/13/2020   Procedure: ABDOMINAL AORTOGRAM W/LOWER EXTREMITY;  Surgeon: Angelia Mould, MD;  Location: [place] [facility];  Service: Cardiovascular;  Laterality: N/A;   AMPUTATION Left 07/08/2018   Procedure: AMPUTATION FORTH  RAY LEFT FOOT;  Surgeon: [name], MD;  Location: [place];  Service: Orthopedics;  Laterality: Left;   AMPUTATION Left 08/09/2018   Procedure: Left Transmetatarsal Amputation;  Surgeon: [name], MD;  Location: [place];  Service: Orthopedics;  Laterality: Left;   AMPUTATION Left 10/08/2018   Procedure: LEFT BELOW KNEE AMPUTATION;  Surgeon: [name], MD;  Location: [place];  Service: Orthopedics;  Laterality: Left;   AMPUTATION Left 10/28/2018   Procedure: REVISION BELOW KNEE AMPUTATION;  Surgeon: [name], MD;  Location: [place];  Service: Orthopedics;  Laterality: Left;   AMPUTATION Left 12/16/2018   Procedure: LEFT ABOVE KNEE AMPUTATION;  Surgeon: [name], MD;  Location: [place];  Service: Orthopedics;  Laterality: Left;   AMPUTATION Right 01/31/2021   Procedure: AMPUTATION BELOW KNEE;  Surgeon: Serafina Mitchell, MD;  Location: [place];  Service: Vascular;  Laterality: Right;   AV FISTULA PLACEMENT Left 10/14/2018   Procedure: Arteriovenous (Av) Fistula Creation Left Arm;  Surgeon: [name], MD;  Location: [place];  Service: Vascular;  Laterality: Left;   [place] Left 04/14/2019   Procedure: BASILIC VEIN TRANSPOSITION SECOND STAGE LEFT ARM;  Surgeon: Rosetta Posner, MD;  Location: [place];  Service: Vascular;  Laterality: Left;   INCISION AND DRAINAGE ABSCESS N/A 10/24/2020   Procedure: exicision of hydradenitis;  Surgeon: Leighton Ruff, MD;  Location: [place];  Service: General;  Laterality: N/A;  45 min   INSERTION OF DIALYSIS CATHETER Right 04/14/2019   Procedure: INSERTION OF DIALYSIS CATHETER;  Surgeon: Rosetta Posner, MD;  Location: [place];  Service: Vascular;  Laterality: Right;   LOWER EXTREMITY ANGIOGRAPHY N/A 07/05/2018   Procedure: LOWER EXTREMITY ANGIOGRAPHY;  Surgeon: Serafina Mitchell, MD;  Location: [place] [facility];  Service: Cardiovascular;  Laterality: N/A;   PERIPHERAL VASCULAR ATHERECTOMY Right 12/13/2020   Procedure:  PERIPHERAL VASCULAR ATHERECTOMY;  Surgeon: Angelia Mould, MD;  Location: [place] [facility];  Service: Cardiovascular;  Laterality: Right;  Superficial femoral   PERIPHERAL VASCULAR BALLOON ANGIOPLASTY Left 07/05/2018   Procedure: PERIPHERAL VASCULAR BALLOON ANGIOPLASTY;  Surgeon: Serafina Mitchell, MD;  Location: [place] [facility];  Service: Cardiovascular;  Laterality: Left;  SFA   PERIPHERAL VASCULAR BALLOON ANGIOPLASTY Right 12/13/2020   Procedure: PERIPHERAL VASCULAR BALLOON ANGIOPLASTY;  Surgeon: Angelia Mould, MD;  Location: [name] [facility];  Service: Cardiovascular;  Laterality: Right;  Peroneal artery, anterior tibial artery   RECTAL EXAM UNDER ANESTHESIA N/A 10/24/2020   Procedure: EXAM UNDER ANESTHESIA;  Surgeon: Leighton Ruff, MD;  Location: [facility];  Service: General;  Laterality: N/A;   STUMP REVISION Left 10/19/2018   Procedure: REVISION LEFT BELOW KNEE AMPUTATION;  Surgeon: [name], MD;  Location: [place];  Service: Orthopedics;  Laterality: Left;   TUBAL LIGATION  2002   Family History  Problem Relation Age of Onset   Diabetes Mother    Diabetes Brother    Diabetes Daughter  Diabetes Daughter    Mental retardation Brother        died from PNA   Diabetes Maternal Grandmother    Social History:  reports that she quit smoking about 6 years ago. Her smoking use included cigarettes. She started smoking about 7 years ago. She has a 1.00 pack-year smoking history. She has never used smokeless tobacco. She reports previous alcohol use. She reports previous drug use. Drug: Marijuana. Allergies: No Known Allergies Medications Prior to Admission  Medication Sig Dispense Refill   albuterol (VENTOLIN HFA) 108 (90 Base) MCG/ACT inhaler Inhale 2 puffs into the lungs every 4 (four) hours as needed for wheezing or shortness of breath.     aspirin EC 81 MG tablet Take 1 tablet (81 mg total) by mouth daily. Swallow whole. 90 tablet 3   B  Complex-C-Folic Acid (RENA-VITE RX) 1 MG TABS Take 1 tablet by mouth daily.     bictegravir-emtricitabine-tenofovir AF (BIKTARVY) 50-200-25 MG TABS tablet TAKE 1 TABLET BY MOUTH DAILY. 30 tablet 3   calcium acetate (PHOSLO) 667 MG capsule Take 2 capsules (1,334 mg total) by mouth 3 (three) times daily with meals. 180 capsule 2   clopidogrel (PLAVIX) 75 MG tablet Take 1 tablet (75 mg total) by mouth daily. 90 tablet 3   diphenhydrAMINE (BENADRYL) 50 MG tablet Take 0.5 tablets (25 mg total) by mouth every 6 (six) hours as needed for itching (As needed for itching). 30 tablet 0   insulin glargine (LANTUS) 100 UNIT/ML Solostar Pen Inject 5 Units into the skin at bedtime. 3 mL 0   loperamide (IMODIUM A-D) 2 MG tablet Take 2 mg by mouth 4 (four) times daily as needed for diarrhea or loose stools.     oxyCODONE-acetaminophen (PERCOCET) 10-325 MG tablet Take 1 tablet by mouth every 6 (six) hours as needed for pain. 15 tablet 0   TUMS 500 MG chewable tablet Chew 2 tablets by mouth at bedtime.     Accu-Chek FastClix Lancets MISC Check blood sugar up to 3 times a day 306 each 3   atorvastatin (LIPITOR) 40 MG tablet Take 1 tablet (40 mg total) by mouth daily. 90 tablet 3   doxycycline (VIBRA-TABS) 100 MG tablet Take 1 tablet (100 mg total) by mouth 2 (two) times daily. (Patient not taking: Reported on 01/29/2021) 20 tablet 1   glucose blood (ACCU-CHEK SMARTVIEW) test strip Check blood sugar 4 times a day as instructed 125 each 12   HEPARIN SODIUM, PORCINE, IV With hemodialysis     iron sucrose in sodium chloride 0.9 % 100 mL Q week with dialysis     Methoxy PEG-Epoetin Beta (MIRCERA IJ) Mircera     mupirocin ointment (BACTROBAN) 2 % Apply 1 application topically 2 (two) times daily. (Patient not taking: Reported on 01/29/2021) 30 g 1   ondansetron (ZOFRAN ODT) 4 MG disintegrating tablet Take 1 tablet (4 mg total) by mouth every 8 (eight) hours as needed for nausea or vomiting. (Patient not taking: Reported on  01/29/2021) 20 tablet 0   promethazine (PHENERGAN) 25 MG tablet Take 25 mg by mouth 3 (three) times daily as needed. (Patient not taking: Reported on 01/29/2021)     sertraline (ZOLOFT) 50 MG tablet Take 1 tablet (50 mg total) by mouth daily. (Patient not taking: Reported on 01/29/2021) 90 tablet 3   Vitamin D, Ergocalciferol, 50000 units CAPS Take by mouth. (Patient not taking: Reported on 01/29/2021)      Drug Regimen Review Drug regimen was reviewed and remains  appropriate with no significant issues identified  Home: Home Living Family/patient expects to be discharged to:: Private residence Living Arrangements: Children Available Help at Discharge: Family, Available PRN/intermittently Type of Home: House Home Access: Stairs to enter [employer] of Steps: 4 Entrance Stairs-Rails: Right, Left, Can reach both Home Layout: One level Bathroom Shower/Tub: Chiropodist: Standard Bathroom Accessibility: Yes Home Equipment: Civil engineer, contracting, Wheelchair - manual, Environmental consultant - 2 wheels   Functional History: Prior Function Level of Independence: Independent with assistive device(s) Comments: uses RW for mobilizatoin and w/c  Functional Status:  Mobility: Bed Mobility Overal bed mobility: Modified Independent Transfers Overall transfer level: Needs assistance Equipment used: None Transfers: Development worker, community transfers: Min assist  Lateral/Scoot Transfers: Min guard General transfer comment: did not perform today Ambulation/Gait General Gait Details: unable    ADL: ADL Overall ADL's : Needs assistance/impaired Eating/Feeding: Set up, Sitting Grooming: Wash/dry hands, Wash/dry face, Sitting Upper Body Bathing: Set up, Sitting Lower Body Bathing: Min guard, Minimal assistance, Sitting/lateral leans Upper Body Dressing : Set up, Sitting Lower Body Dressing: Min guard, Minimal assistance, Sitting/lateral leans Toilet Transfer: Min guard,  Transfer board, Requires drop arm Toilet Transfer Details (indicate cue type and reason): simulated - pt with great ability to lateral scoot between surfaces Toileting- Clothing Manipulation and Hygiene: Min guard, Minimal assistance Tub/ Banker: Minimal assistance, Moderate assistance, Transfer board Functional mobility during ADLs: Min guard General ADL Comments: Pt requested to remain in the bed due to leaving for dialysis soon. performed grooming EOB, and lateral scoots along EOB.  Cognition: Cognition Overall Cognitive Status: Within Functional Limits for tasks assessed Orientation Level: Oriented X4 Cognition Arousal/Alertness: Awake/alert Behavior During Therapy: WFL for tasks assessed/performed Overall Cognitive Status: Within Functional Limits for tasks assessed  Physical Exam: Blood pressure (!) 173/81, pulse 83, temperature 99.3 F (37.4 C), temperature source Oral, resp. rate 18, height '5\' 5"'$  (1.651 m), weight 71.8 kg, SpO2 100 %. Physical Exam Constitutional:      Appearance: She is obese.  HENT:     Head: Normocephalic and atraumatic.     Nose: Nose normal.  Eyes:     Extraocular Movements: Extraocular movements intact.     Pupils: Pupils are equal, round, and reactive to light.  Cardiovascular:     Rate and Rhythm: Normal rate and regular rhythm.     Heart sounds: No murmur heard.   No gallop.     Comments: Left AVF with thrill Pulmonary:     Effort: Pulmonary effort is normal. No respiratory distress.     Breath sounds: No wheezing.  Abdominal:     General: There is no distension.     Palpations: Abdomen is soft.     Tenderness: There is no abdominal tenderness.  Musculoskeletal:        General: Swelling and tenderness present. Normal range of motion.  Skin:    General: Skin is warm.     Comments: Right BKA site is dressed appropriately tender with limb protector in place. Incision with sl serosanguinous drainage. Left AKA site well-healed   Neurological:     Mental Status: She is alert.     Comments: Alert and oriented x 3. Normal insight and awareness. Intact Memory. Normal language and speech. Cranial nerve exam unremarkable. Motor 5/5 UE, LLE 5/5 HF and RLE: pt can lift leg and flex/extend agst gravity. No focal sensory findings. DTR's 1+  Psychiatric:        Mood and Affect: Mood normal.  Behavior: Behavior normal.        Thought Content: Thought content normal.        Judgment: Judgment normal.    Results for orders placed or performed during the hospital encounter of 01/28/21 (from the past 48 hour(s))  Glucose, capillary     Status: Abnormal   Collection Time: 02/05/21  5:13 PM  Result Value Ref Range   Glucose-Capillary 108 (H) 70 - 99 mg/dL    Comment: Glucose reference range applies only to samples taken after fasting for at least 8 hours.  Glucose, capillary     Status: Abnormal   Collection Time: 02/05/21  8:52 PM  Result Value Ref Range   Glucose-Capillary 153 (H) 70 - 99 mg/dL    Comment: Glucose reference range applies only to samples taken after fasting for at least 8 hours.  CBC     Status: Abnormal   Collection Time: 02/06/21  1:45 AM  Result Value Ref Range   WBC 7.7 4.0 - 10.5 K/uL   RBC 2.75 (L) 3.87 - 5.11 MIL/uL   Hemoglobin 8.2 (L) 12.0 - 15.0 g/dL   HCT 25.6 (L) 36.0 - 46.0 %   MCV 93.1 80.0 - 100.0 fL   MCH 29.8 26.0 - 34.0 pg   MCHC 32.0 30.0 - 36.0 g/dL   RDW 15.0 11.5 - 15.5 %   Platelets 421 (H) 150 - 400 K/uL   nRBC 0.0 0.0 - 0.2 %    Comment: Performed at [facility] [address], [place], [place] [address]  Renal function panel     Status: Abnormal   Collection Time: 02/06/21  1:45 AM  Result Value Ref Range   Sodium 128 (L) 135 - 145 mmol/L   Potassium 5.3 (H) 3.5 - 5.1 mmol/L   Chloride 84 (L) 98 - 111 mmol/L   CO2 28 22 - 32 mmol/L   Glucose, Bld 219 (H) 70 - 99 mg/dL    Comment: Glucose reference range applies only to samples taken after fasting for at  least 8 hours.   BUN 47 (H) 6 - 20 mg/dL   Creatinine, Ser 8.18 (H) 0.44 - 1.00 mg/dL   Calcium 10.4 (H) 8.9 - 10.3 mg/dL   Phosphorus 6.0 (H) 2.5 - 4.6 mg/dL   Albumin 2.5 (L) 3.5 - 5.0 g/dL   GFR, Estimated 6 (L) >60 mL/min    Comment: (NOTE) Calculated using the CKD-EPI Creatinine Equation (2021)    Anion gap 16 (H) 5 - 15    Comment: Performed at [facility] [address], [place], [place] [address]  Glucose, capillary     Status: Abnormal   Collection Time: 02/06/21  8:09 AM  Result Value Ref Range   Glucose-Capillary 156 (H) 70 - 99 mg/dL    Comment: Glucose reference range applies only to samples taken after fasting for at least 8 hours.  Glucose, capillary     Status: Abnormal   Collection Time: 02/06/21 11:43 AM  Result Value Ref Range   Glucose-Capillary 113 (H) 70 - 99 mg/dL    Comment: Glucose reference range applies only to samples taken after fasting for at least 8 hours.  Glucose, capillary     Status: Abnormal   Collection Time: 02/06/21  4:47 PM  Result Value Ref Range   Glucose-Capillary 141 (H) 70 - 99 mg/dL    Comment: Glucose reference range applies only to samples taken after fasting for at least 8 hours.  Glucose, capillary  Status: Abnormal   Collection Time: 02/06/21  8:49 PM  Result Value Ref Range   Glucose-Capillary 157 (H) 70 - 99 mg/dL    Comment: Glucose reference range applies only to samples taken after fasting for at least 8 hours.  Renal function panel     Status: Abnormal   Collection Time: 02/07/21  6:19 AM  Result Value Ref Range   Sodium 132 (L) 135 - 145 mmol/L   Potassium 4.3 3.5 - 5.1 mmol/L    Comment: DIALYSIS   Chloride 93 (L) 98 - 111 mmol/L   CO2 27 22 - 32 mmol/L   Glucose, Bld 146 (H) 70 - 99 mg/dL    Comment: Glucose reference range applies only to samples taken after fasting for at least 8 hours.   BUN 25 (H) 6 - 20 mg/dL   Creatinine, Ser 5.36 (H) 0.44 - 1.00 mg/dL    Comment: DIALYSIS   Calcium 9.1 8.9 -  10.3 mg/dL   Phosphorus 3.4 2.5 - 4.6 mg/dL   Albumin 2.6 (L) 3.5 - 5.0 g/dL   GFR, Estimated 10 (L) >60 mL/min    Comment: (NOTE) Calculated using the CKD-EPI Creatinine Equation (2021)    Anion gap 12 5 - 15    Comment: Performed at [facility] [address] [place], [place] [address]  Glucose, capillary     Status: Abnormal   Collection Time: 02/07/21  7:55 AM  Result Value Ref Range   Glucose-Capillary 172 (H) 70 - 99 mg/dL    Comment: Glucose reference range applies only to samples taken after fasting for at least 8 hours.   Comment 1 QC Due   Glucose, capillary     Status: Abnormal   Collection Time: 02/07/21 11:45 AM  Result Value Ref Range   Glucose-Capillary 135 (H) 70 - 99 mg/dL    Comment: Glucose reference range applies only to samples taken after fasting for at least 8 hours.   No results found.     Medical Problem List and Plan: 1.  Debility secondary to right BKA 01/31/2021 with history of left AKA  -patient may  shower if right BKA incision is covered  -ELOS/Goals: 7 days, mod I at w/c level for PT and OT 2.  Antithrombotics: -DVT/anticoagulation: Subcutaneous heparin  -antiplatelet therapy: Aspirin 81 mg daily and Plavix 75 mg daily 3. Pain Management: Neurontin 100 mg 3 times weekly, oxycodone as needed  -massage, edema control right residual limb 4. Mood: Zoloft 50 mg nightly  -antipsychotic agents: N/A 5. Neuropsych: This patient is capable of making decisions on her own behalf. 6. Skin/Wound Care: dry dressing to RLE  -move to shrinker soon 7. Fluids/Electrolytes/Nutrition: Routine in and outs with follow-up chemistries 8. ESRD.  Continue hemodialysis as directed. HD scheduled after therapies to allow max participation during the day. 9.  Acute on chronic anemia.  Follow-up CBC.  Continue Aranesp 10.  HIV: BIKTARVY 11.  Hyperlipidemia.  Lipitor 12.  Diabetes mellitus.  5 units nightly.  Check blood sugars before meals and at bedtime  -adjust  regimen as needed 13.  GERD.  Protonix 14.  Constipation.  No BM since admit -sorbitol today -schedule senna-s at bedtime  Cathlyn Parsons, PA-C 02/07/2021

## 2021-02-07 NOTE — Progress Notes (Signed)
Inpatient Rehabilitation Admissions Coordinator    We have not heard a determination for a possible [facility] admit from our appeal. I spoke with patient by phone and she is are. Tomorrow will be the 72 hrs since appeal. Patient states she will await that decision until tomorrow.  Danne Baxter, RN, MSN Rehab Admissions Coordinator [phone] 02/07/2021 1:23 PM

## 2021-02-07 NOTE — Progress Notes (Signed)
[facility] Progress Note   Subjective:   Patient seen and examined in room.  Frustrated and tearful this AM.  Feels like people are having secret meetings about her and not telling her what is going on, "Like I have done something wrong."  Nurse and myself reassured her she has not done anything wrong to our knowledge.  Refuses part of her exam.  Stating "I just can't right now."  Admits to stump pain. Denies CP, n/v/d, abdominal pain and fatigue.   Objective Vitals:   02/07/21 0030 02/07/21 0052 02/07/21 0141 02/07/21 0423  BP: 130/63 (!) 168/94 138/65 (!) 173/81  Pulse: 87 91 88 83  Resp: '16 16 16 18  '$ Temp:  98 F (36.7 C)  99.3 F (37.4 C)  TempSrc:  Oral  Oral  SpO2:  97%  100%  Weight:  71.8 kg 71.8 kg   Height:       Physical Exam General:chronically ill appearing female in NAD, sitting in bedside chair Heart:RRR Lungs:CTAB, nml WOB on RA Abdomen:refused exam Extremities:L AKA, R BKA - unable to assess edema Dialysis Access: LU AVF   Filed Weights   02/06/21 2141 02/07/21 0052 02/07/21 0141  Weight: 73.8 kg 71.8 kg 71.8 kg    Intake/Output Summary (Last 24 hours) at 02/07/2021 1307 Last data filed at 02/07/2021 0900 Gross per 24 hour  Intake 700 ml  Output 2000 ml  Net -1300 ml    Additional Objective Labs: Basic Metabolic Panel: Recent Labs  Lab 02/05/21 0322 02/06/21 0145 02/07/21 0619  NA 130* 128* 132*  K 5.0 5.3* 4.3  CL 90* 84* 93*  CO2 '27 28 27  '$ GLUCOSE 132* 219* 146*  BUN 27* 47* 25*  CREATININE 6.10* 8.18* 5.36*  CALCIUM 9.4 10.4* 9.1  PHOS 4.7* 6.0* 3.4   Liver Function Tests: Recent Labs  Lab 02/05/21 0322 02/06/21 0145 02/07/21 0619  ALBUMIN 2.4* 2.5* 2.6*   CBC: Recent Labs  Lab 02/02/21 0107 02/03/21 0049 02/04/21 0112 02/05/21 0322 02/06/21 0145  WBC 8.9 9.5 9.6 8.3 7.7  HGB 9.3* 9.0* 8.6* 8.1* 8.2*  HCT 29.7* 28.0* 27.0* 26.1* 25.6*  MCV 96.1 95.2 94.4 95.3 93.1  PLT 321 324 350 359 421*   Blood  Culture    Component Value Date/Time   SDES BLOOD RIGHT ANTECUBITAL 01/28/2021 1812   SPECREQUEST  01/28/2021 1812    BOTTLES DRAWN AEROBIC AND ANAEROBIC Blood Culture adequate volume   CULT  01/28/2021 1812    NO GROWTH 5 DAYS Performed at [facility], [address], [place], [place] [address]    REPTSTATUS 02/02/2021 FINAL 01/28/2021 1812    CBG: Recent Labs  Lab 02/06/21 1143 02/06/21 1647 02/06/21 2049 02/07/21 0755 02/07/21 1145  GLUCAP 113* 141* 157* 172* 135*    Medications:  sodium chloride     iron sucrose 100 mg (02/04/21 1149)   magnesium sulfate bolus IVPB      (feeding supplement) PROSource Plus  30 mL Oral BID BM   aspirin EC  81 mg Oral Daily   atorvastatin  40 mg Oral Daily   bictegravir-emtricitabine-tenofovir AF  1 tablet Oral Daily   bisacodyl  5 mg Oral Daily   calcitRIOL  1 mcg Oral Daily   calcium acetate  2,001 mg Oral TID WC   Chlorhexidine Gluconate Cloth  6 each Topical Daily   clopidogrel  75 mg Oral Daily   [START ON 02/08/2021] darbepoetin (ARANESP) injection - DIALYSIS  100 mcg Intravenous  Q Sat-HD   feeding supplement (NEPRO CARB STEADY)  237 mL Oral BID BM   gabapentin  100 mg Oral Once per day on Tue Thu Sat   heparin  5,000 Units Subcutaneous Q8H   insulin aspart  0-15 Units Subcutaneous TID WC   insulin glargine  5 Units Subcutaneous QHS   multivitamin  1 tablet Oral QHS   pantoprazole  40 mg Oral Daily   polyethylene glycol  17 g Oral BID   senna  2 tablet Oral BID   sertraline  50 mg Oral QHS   sodium chloride flush  3 mL Intravenous Q12H    Dialysis Orders: [facility]-TTS 4h   75 kg    400/1.5    2/ 2.0 bath  AVF Hep 3000 -Calcitriol 1.25 mcg p.o. q. 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 ESRD: HD TTS - HD tomorrow per regular schedule.  Hypertension/volume: BP  elevated this AM. Continue home meds.  Under EDW, expected post BKA, lower dry on d/c ~71kg.  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  Venofer '100mg'$  qHD x 4 ordered.  Transfuse as needed.   Metabolic bone disease: Phoslo initially increased 3AC due to high phos, Calcium elevated so reduced back to 2AC.  Phos and CCa in goal today. May need to change to non calcium based binder if continues to have [employer]. Nutrition: Albumin low. renal carb modified diet albumin today protein supplement with prosource. HIV-meds per primary Diabetes mellitus type 2-per primary Depressive disorder on sertraline per primary Disposition: Informed by rehab admission coordinator of insurance appeal for CIR admit began 02/05/21-awaiting their final decision. Otherwise plan to d/c to SNF.   Jen Mow, PA-C [facility] 02/07/2021,1:07 PM  LOS: 9 days

## 2021-02-07 NOTE — Progress Notes (Signed)
  Progress Note    02/07/2021 7:45 AM 7 Days Post-Op  Subjective:  No complaints   Vitals:   02/07/21 0141 02/07/21 0423  BP: 138/65 (!) 173/81  Pulse: 88 83  Resp: 16 18  Temp:  99.3 F (37.4 C)  SpO2:  100%   Physical Exam: Lungs:  non labored Incisions:  R BKA incision c/d/I, flap and skin edges are viable Abdomen:  soft Neurologic: A&O  CBC    Component Value Date/Time   WBC 7.7 02/06/2021 0145   RBC 2.75 (L) 02/06/2021 0145   HGB 8.2 (L) 02/06/2021 0145   HGB 9.8 (L) 07/18/2018 1133   HCT 25.6 (L) 02/06/2021 0145   HCT 19.3 (L) 10/09/2018 1405   PLT 421 (H) 02/06/2021 0145   PLT 417 07/18/2018 1133   MCV 93.1 02/06/2021 0145   MCV 89 07/18/2018 1133   MCH 29.8 02/06/2021 0145   MCHC 32.0 02/06/2021 0145   RDW 15.0 02/06/2021 0145   RDW 12.9 07/18/2018 1133   LYMPHSABS 1.0 01/28/2021 1803   LYMPHSABS 1.6 12/23/2017 1558   MONOABS 0.4 01/28/2021 1803   EOSABS 0.1 01/28/2021 1803   EOSABS 0.2 12/23/2017 1558   BASOSABS 0.1 01/28/2021 1803   BASOSABS 0.1 12/23/2017 1558    BMET    Component Value Date/Time   NA 132 (L) 02/07/2021 0619   NA 138 12/23/2017 1558   K 4.3 02/07/2021 0619   CL 93 (L) 02/07/2021 0619   CO2 27 02/07/2021 0619   GLUCOSE 146 (H) 02/07/2021 0619   BUN 25 (H) 02/07/2021 0619   BUN 28 (H) 12/23/2017 1558   CREATININE 5.36 (H) 02/07/2021 0619   CREATININE 12.07 (H) 05/29/2020 1155   CALCIUM 9.1 02/07/2021 0619   CALCIUM 8.5 (L) 11/25/2018 0309   GFRNONAA 10 (L) 02/07/2021 0619   GFRNONAA 79 11/14/2014 1154   GFRAA 5 (L) 01/06/2020 1329   GFRAA >89 11/14/2014 1154    INR    Component Value Date/Time   INR 1.3 (H) 01/28/2021 1803     Intake/Output Summary (Last 24 hours) at 02/07/2021 0745 Last data filed at 02/07/2021 0052 Gross per 24 hour  Intake 720 ml  Output 2000 ml  Net -1280 ml     Assessment/Plan:  45 y.o. female is s/p R BKA 7 Days Post-Op   Dressing removed; incision appears to be healing well; black  sock and stump protector re-applied Ok for discharge from vascular standpoint Office will arrange staple removal in 4-6 weeks   Dagoberto Ligas, PA-C [facility] [phone] 02/07/2021 7:45 AM

## 2021-02-08 ENCOUNTER — Inpatient Hospital Stay (HOSPITAL_COMMUNITY)
Admission: RE | Admit: 2021-02-08 | Discharge: 2021-02-15 | DRG: 559 | Disposition: A | Discharge status: Home Health | Payer: Medicare Other | Source: Intra-hospital | Attending: Physical Medicine & Rehabilitation | Admitting: Physical Medicine & Rehabilitation

## 2021-02-08 ENCOUNTER — Encounter (HOSPITAL_COMMUNITY): Payer: Self-pay | Admitting: Physical Medicine & Rehabilitation

## 2021-02-08 ENCOUNTER — Other Ambulatory Visit: Payer: Self-pay

## 2021-02-08 DIAGNOSIS — Z4781 Encounter for orthopedic aftercare following surgical amputation: Principal | ICD-10-CM

## 2021-02-08 DIAGNOSIS — K219 Gastro-esophageal reflux disease without esophagitis: Secondary | ICD-10-CM | POA: Diagnosis not present

## 2021-02-08 DIAGNOSIS — E1151 Type 2 diabetes mellitus with diabetic peripheral angiopathy without gangrene: Secondary | ICD-10-CM | POA: Diagnosis not present

## 2021-02-08 DIAGNOSIS — Z89612 Acquired absence of left leg above knee: Secondary | ICD-10-CM

## 2021-02-08 DIAGNOSIS — I739 Peripheral vascular disease, unspecified: Secondary | ICD-10-CM | POA: Diagnosis not present

## 2021-02-08 DIAGNOSIS — I96 Gangrene, not elsewhere classified: Secondary | ICD-10-CM | POA: Diagnosis not present

## 2021-02-08 DIAGNOSIS — K5900 Constipation, unspecified: Secondary | ICD-10-CM | POA: Diagnosis not present

## 2021-02-08 DIAGNOSIS — E669 Obesity, unspecified: Secondary | ICD-10-CM | POA: Diagnosis present

## 2021-02-08 DIAGNOSIS — M898X9 Other specified disorders of bone, unspecified site: Secondary | ICD-10-CM | POA: Diagnosis present

## 2021-02-08 DIAGNOSIS — Z992 Dependence on renal dialysis: Secondary | ICD-10-CM

## 2021-02-08 DIAGNOSIS — Z7982 Long term (current) use of aspirin: Secondary | ICD-10-CM | POA: Diagnosis not present

## 2021-02-08 DIAGNOSIS — E11649 Type 2 diabetes mellitus with hypoglycemia without coma: Secondary | ICD-10-CM | POA: Diagnosis not present

## 2021-02-08 DIAGNOSIS — Z7902 Long term (current) use of antithrombotics/antiplatelets: Secondary | ICD-10-CM | POA: Diagnosis not present

## 2021-02-08 DIAGNOSIS — K5901 Slow transit constipation: Secondary | ICD-10-CM | POA: Diagnosis not present

## 2021-02-08 DIAGNOSIS — N2581 Secondary hyperparathyroidism of renal origin: Secondary | ICD-10-CM | POA: Diagnosis present

## 2021-02-08 DIAGNOSIS — I120 Hypertensive chronic kidney disease with stage 5 chronic kidney disease or end stage renal disease: Secondary | ICD-10-CM | POA: Diagnosis not present

## 2021-02-08 DIAGNOSIS — F32A Depression, unspecified: Secondary | ICD-10-CM | POA: Diagnosis present

## 2021-02-08 DIAGNOSIS — E1169 Type 2 diabetes mellitus with other specified complication: Secondary | ICD-10-CM

## 2021-02-08 DIAGNOSIS — D631 Anemia in chronic kidney disease: Secondary | ICD-10-CM | POA: Diagnosis not present

## 2021-02-08 DIAGNOSIS — Z89511 Acquired absence of right leg below knee: Secondary | ICD-10-CM | POA: Diagnosis not present

## 2021-02-08 DIAGNOSIS — E113393 Type 2 diabetes mellitus with moderate nonproliferative diabetic retinopathy without macular edema, bilateral: Secondary | ICD-10-CM | POA: Diagnosis not present

## 2021-02-08 DIAGNOSIS — N186 End stage renal disease: Secondary | ICD-10-CM | POA: Diagnosis present

## 2021-02-08 DIAGNOSIS — F419 Anxiety disorder, unspecified: Secondary | ICD-10-CM | POA: Diagnosis present

## 2021-02-08 DIAGNOSIS — E785 Hyperlipidemia, unspecified: Secondary | ICD-10-CM | POA: Diagnosis not present

## 2021-02-08 DIAGNOSIS — Z6824 Body mass index (BMI) 24.0-24.9, adult: Secondary | ICD-10-CM

## 2021-02-08 DIAGNOSIS — Z833 Family history of diabetes mellitus: Secondary | ICD-10-CM | POA: Diagnosis not present

## 2021-02-08 DIAGNOSIS — Z794 Long term (current) use of insulin: Secondary | ICD-10-CM

## 2021-02-08 DIAGNOSIS — Z87891 Personal history of nicotine dependence: Secondary | ICD-10-CM

## 2021-02-08 DIAGNOSIS — B20 Human immunodeficiency virus [HIV] disease: Secondary | ICD-10-CM | POA: Diagnosis present

## 2021-02-08 DIAGNOSIS — E1122 Type 2 diabetes mellitus with diabetic chronic kidney disease: Secondary | ICD-10-CM | POA: Diagnosis present

## 2021-02-08 DIAGNOSIS — D62 Acute posthemorrhagic anemia: Secondary | ICD-10-CM | POA: Diagnosis not present

## 2021-02-08 DIAGNOSIS — Z79899 Other long term (current) drug therapy: Secondary | ICD-10-CM

## 2021-02-08 LAB — CBC
HCT: 24.2 % — ABNORMAL LOW (ref 36.0–46.0)
Hemoglobin: 7.6 g/dL — ABNORMAL LOW (ref 12.0–15.0)
MCH: 29.6 pg (ref 26.0–34.0)
MCHC: 31.4 g/dL (ref 30.0–36.0)
MCV: 94.2 fL (ref 80.0–100.0)
Platelets: 458 10*3/uL — ABNORMAL HIGH (ref 150–400)
RBC: 2.57 MIL/uL — ABNORMAL LOW (ref 3.87–5.11)
RDW: 14.8 % (ref 11.5–15.5)
WBC: 9.4 10*3/uL (ref 4.0–10.5)
nRBC: 0 % (ref 0.0–0.2)

## 2021-02-08 LAB — RENAL FUNCTION PANEL
Albumin: 2.5 g/dL — ABNORMAL LOW (ref 3.5–5.0)
Anion gap: 15 (ref 5–15)
BUN: 40 mg/dL — ABNORMAL HIGH (ref 6–20)
CO2: 24 mmol/L (ref 22–32)
Calcium: 9.9 mg/dL (ref 8.9–10.3)
Chloride: 91 mmol/L — ABNORMAL LOW (ref 98–111)
Creatinine, Ser: 7.36 mg/dL — ABNORMAL HIGH (ref 0.44–1.00)
GFR, Estimated: 6 mL/min — ABNORMAL LOW (ref >60)
Glucose, Bld: 115 mg/dL — ABNORMAL HIGH (ref 70–99)
Phosphorus: 4.5 mg/dL (ref 2.5–4.6)
Potassium: 4.7 mmol/L (ref 3.5–5.1)
Sodium: 130 mmol/L — ABNORMAL LOW (ref 135–145)

## 2021-02-08 LAB — GLUCOSE, CAPILLARY
Glucose-Capillary: 100 mg/dL — ABNORMAL HIGH (ref 70–99)
Glucose-Capillary: 136 mg/dL — ABNORMAL HIGH (ref 70–99)
Glucose-Capillary: 136 mg/dL — ABNORMAL HIGH (ref 70–99)

## 2021-02-08 MED ORDER — HYDROMORPHONE HCL 1 MG/ML IJ SOLN
INTRAMUSCULAR | Status: AC
  Administered 2021-02-08: 0.5 mg via INTRAVENOUS
  Filled 2021-02-08: qty 0.5

## 2021-02-08 MED ORDER — DARBEPOETIN ALFA 100 MCG/0.5ML IJ SOSY
PREFILLED_SYRINGE | INTRAMUSCULAR | Status: AC
  Administered 2021-02-08: 100 ug via INTRAVENOUS
  Filled 2021-02-08: qty 0.5

## 2021-02-08 MED ORDER — GUAIFENESIN-DM 100-10 MG/5ML PO SYRP: 15.0000 mL | ORAL_SOLUTION | ORAL | Status: DC | PRN

## 2021-02-08 MED ORDER — BICTEGRAVIR-EMTRICITAB-TENOFOV 50-200-25 MG PO TABS
1.0000 | ORAL_TABLET | Freq: Every day | ORAL | Status: DC
  Administered 2021-02-08 – 2021-02-14 (×7): 1 via ORAL
  Filled 2021-02-08 (×8): qty 1

## 2021-02-08 MED ORDER — LIDOCAINE-PRILOCAINE 2.5-2.5 % EX CREA: 1.0000 "application " | TOPICAL_CREAM | CUTANEOUS | Status: DC | PRN

## 2021-02-08 MED ORDER — INSULIN ASPART 100 UNIT/ML IJ SOLN
0.0000 [IU] | Freq: Three times a day (TID) | INTRAMUSCULAR | Status: DC
  Administered 2021-02-09: 5 [IU] via SUBCUTANEOUS
  Administered 2021-02-09 – 2021-02-10 (×3): 3 [IU] via SUBCUTANEOUS
  Administered 2021-02-10 – 2021-02-11 (×2): 2 [IU] via SUBCUTANEOUS
  Administered 2021-02-11: 3 [IU] via SUBCUTANEOUS
  Administered 2021-02-11: 2 [IU] via SUBCUTANEOUS
  Administered 2021-02-12 (×2): 3 [IU] via SUBCUTANEOUS
  Administered 2021-02-12: 5 [IU] via SUBCUTANEOUS
  Administered 2021-02-13: 3 [IU] via SUBCUTANEOUS
  Administered 2021-02-13: 2 [IU] via SUBCUTANEOUS
  Administered 2021-02-14 – 2021-02-15 (×3): 3 [IU] via SUBCUTANEOUS

## 2021-02-08 MED ORDER — CALCIUM ACETATE (PHOS BINDER) 667 MG PO CAPS
2001.0000 mg | ORAL_CAPSULE | Freq: Three times a day (TID) | ORAL | Status: DC
  Administered 2021-02-09 – 2021-02-12 (×9): 2001 mg via ORAL
  Filled 2021-02-08 (×11): qty 3

## 2021-02-08 MED ORDER — SENNA 8.6 MG PO TABS
2.0000 | ORAL_TABLET | Freq: Two times a day (BID) | ORAL | Status: DC
  Administered 2021-02-08 – 2021-02-14 (×13): 17.2 mg via ORAL
  Filled 2021-02-08 (×13): qty 2

## 2021-02-08 MED ORDER — GABAPENTIN 100 MG PO CAPS
100.0000 mg | ORAL_CAPSULE | ORAL | Status: DC
  Administered 2021-02-08 – 2021-02-11 (×2): 100 mg via ORAL
  Filled 2021-02-08 (×2): qty 1

## 2021-02-08 MED ORDER — HEPARIN SODIUM (PORCINE) 5000 UNIT/ML IJ SOLN: 5000.0000 [IU] | Freq: Three times a day (TID) | INTRAMUSCULAR | Status: DC

## 2021-02-08 MED ORDER — ALTEPLASE 2 MG IJ SOLR: 2.0000 mg | Freq: Once | INTRAMUSCULAR | Status: DC | PRN

## 2021-02-08 MED ORDER — SODIUM CHLORIDE 0.9 % IV SOLN
100.0000 mL | INTRAVENOUS | Status: DC | PRN
  Administered 2021-02-08: 100 mL via INTRAVENOUS

## 2021-02-08 MED ORDER — ATORVASTATIN CALCIUM 40 MG PO TABS
40.0000 mg | ORAL_TABLET | Freq: Every day | ORAL | Status: DC
  Administered 2021-02-09 – 2021-02-14 (×6): 40 mg via ORAL
  Filled 2021-02-08 (×7): qty 1

## 2021-02-08 MED ORDER — RENA-VITE PO TABS
1.0000 | ORAL_TABLET | Freq: Every day | ORAL | Status: DC
  Administered 2021-02-08 – 2021-02-14 (×7): 1 via ORAL
  Filled 2021-02-08 (×7): qty 1

## 2021-02-08 MED ORDER — CALCIUM ACETATE (PHOS BINDER) 667 MG PO CAPS: 2001.0000 mg | ORAL_CAPSULE | Freq: Three times a day (TID) | ORAL | 0 refills | Status: DC

## 2021-02-08 MED ORDER — HEPARIN SODIUM (PORCINE) 1000 UNIT/ML DIALYSIS: 1000.0000 [IU] | INTRAMUSCULAR | Status: DC | PRN

## 2021-02-08 MED ORDER — AMLODIPINE BESYLATE 5 MG PO TABS: 5.0000 mg | ORAL_TABLET | Freq: Every day | ORAL | 2 refills | Status: DC

## 2021-02-08 MED ORDER — BICTEGRAVIR-EMTRICITAB-TENOFOV 50-200-25 MG PO TABS
1.0000 | ORAL_TABLET | Freq: Every day | ORAL | Status: DC
  Filled 2021-02-08: qty 1

## 2021-02-08 MED ORDER — BISACODYL 5 MG PO TBEC
5.0000 mg | DELAYED_RELEASE_TABLET | Freq: Every day | ORAL | Status: DC
  Administered 2021-02-09 – 2021-02-14 (×6): 5 mg via ORAL
  Filled 2021-02-08 (×5): qty 1

## 2021-02-08 MED ORDER — ASPIRIN EC 81 MG PO TBEC
81.0000 mg | DELAYED_RELEASE_TABLET | Freq: Every day | ORAL | Status: DC
  Administered 2021-02-09 – 2021-02-14 (×6): 81 mg via ORAL
  Filled 2021-02-08 (×6): qty 1

## 2021-02-08 MED ORDER — BISACODYL 5 MG PO TBEC: 5.0000 mg | DELAYED_RELEASE_TABLET | Freq: Every day | ORAL | 0 refills | Status: DC

## 2021-02-08 MED ORDER — ACETAMINOPHEN 325 MG PO TABS
650.0000 mg | ORAL_TABLET | Freq: Four times a day (QID) | ORAL | Status: DC | PRN
  Administered 2021-02-12 – 2021-02-14 (×5): 650 mg via ORAL
  Filled 2021-02-08 (×5): qty 2

## 2021-02-08 MED ORDER — DARBEPOETIN ALFA 100 MCG/0.5ML IJ SOSY: 100.0000 ug | PREFILLED_SYRINGE | INTRAMUSCULAR | Status: DC

## 2021-02-08 MED ORDER — OXYCODONE HCL 5 MG PO TABS
5.0000 mg | ORAL_TABLET | Freq: Four times a day (QID) | ORAL | Status: DC | PRN
  Administered 2021-02-08 – 2021-02-10 (×6): 5 mg via ORAL
  Filled 2021-02-08 (×6): qty 1

## 2021-02-08 MED ORDER — INSULIN GLARGINE 100 UNIT/ML ~~LOC~~ SOLN
5.0000 [IU] | Freq: Every day | SUBCUTANEOUS | Status: DC
  Administered 2021-02-08 – 2021-02-12 (×5): 5 [IU] via SUBCUTANEOUS
  Filled 2021-02-08 (×7): qty 0.05

## 2021-02-08 MED ORDER — PROSOURCE PLUS PO LIQD: 30.0000 mL | Freq: Two times a day (BID) | ORAL | Status: DC

## 2021-02-08 MED ORDER — SERTRALINE HCL 50 MG PO TABS
50.0000 mg | ORAL_TABLET | Freq: Every day | ORAL | Status: DC
  Administered 2021-02-08 – 2021-02-14 (×7): 50 mg via ORAL
  Filled 2021-02-08 (×8): qty 1

## 2021-02-08 MED ORDER — ACETAMINOPHEN 650 MG RE SUPP: 650.0000 mg | Freq: Four times a day (QID) | RECTAL | Status: DC | PRN

## 2021-02-08 MED ORDER — HEPARIN SODIUM (PORCINE) 5000 UNIT/ML IJ SOLN
5000.0000 [IU] | Freq: Three times a day (TID) | INTRAMUSCULAR | Status: DC
  Administered 2021-02-08 – 2021-02-14 (×17): 5000 [IU] via SUBCUTANEOUS
  Filled 2021-02-08 (×18): qty 1

## 2021-02-08 MED ORDER — GABAPENTIN 100 MG PO CAPS: 100.0000 mg | ORAL_CAPSULE | ORAL | 0 refills | Status: DC

## 2021-02-08 MED ORDER — CALCITRIOL 0.5 MCG PO CAPS
1.0000 ug | ORAL_CAPSULE | Freq: Every day | ORAL | Status: DC
  Administered 2021-02-09 – 2021-02-12 (×4): 1 ug via ORAL
  Filled 2021-02-08 (×5): qty 2

## 2021-02-08 MED ORDER — NEPRO/CARBSTEADY PO LIQD
237.0000 mL | Freq: Two times a day (BID) | ORAL | Status: DC
  Administered 2021-02-09 – 2021-02-14 (×7): 237 mL via ORAL

## 2021-02-08 MED ORDER — PENTAFLUOROPROP-TETRAFLUOROETH EX AERO: 1.0000 "application " | INHALATION_SPRAY | CUTANEOUS | Status: DC | PRN

## 2021-02-08 MED ORDER — PANTOPRAZOLE SODIUM 40 MG PO TBEC: 40.0000 mg | DELAYED_RELEASE_TABLET | Freq: Every day | ORAL | Status: DC

## 2021-02-08 MED ORDER — LIDOCAINE HCL (PF) 1 % IJ SOLN: 5.0000 mL | INTRAMUSCULAR | Status: DC | PRN

## 2021-02-08 MED ORDER — PANTOPRAZOLE SODIUM 40 MG PO TBEC
40.0000 mg | DELAYED_RELEASE_TABLET | Freq: Every day | ORAL | Status: DC
  Administered 2021-02-09 – 2021-02-14 (×6): 40 mg via ORAL
  Filled 2021-02-08 (×6): qty 1

## 2021-02-08 MED ORDER — POLYETHYLENE GLYCOL 3350 17 G PO PACK
17.0000 g | PACK | Freq: Two times a day (BID) | ORAL | Status: DC
  Administered 2021-02-08 – 2021-02-14 (×13): 17 g via ORAL
  Filled 2021-02-08 (×13): qty 1

## 2021-02-08 MED ORDER — SODIUM CHLORIDE 0.9 % IV SOLN: 100.0000 mL | INTRAVENOUS | Status: DC | PRN

## 2021-02-08 MED ORDER — CLOPIDOGREL BISULFATE 75 MG PO TABS
75.0000 mg | ORAL_TABLET | Freq: Every day | ORAL | Status: DC
  Administered 2021-02-09 – 2021-02-14 (×6): 75 mg via ORAL
  Filled 2021-02-08 (×6): qty 1

## 2021-02-08 NOTE — Progress Notes (Signed)
[facility] Progress Note   Subjective:   Patient seen and examined at bedside.  Feeling better today.  Continues to have stump pain.  Per nurse applied dressing due to small amount of blood oozing from incision. Patient denies CP, SOB, n/v/d and abdominal pain. Finally had BM yesterday but continues to feel constipated.   Objective Vitals:   02/07/21 0423 02/07/21 1622 02/07/21 1934 02/08/21 0425  BP: (!) 173/81 (!) 186/87 (!) 158/70   Pulse: 83 83 80 91  Resp: '18 18 17 18  '$ Temp: 99.3 F (37.4 C) 98.4 F (36.9 C) 98.6 F (37 C) 98.4 F (36.9 C)  TempSrc: Oral Oral Oral Oral  SpO2: 100% 100% 97% 99%  Weight:      Height:       Physical Exam General:chronically ill appearing female in NAD Heart:RRR, no mrg Lungs:CTAB, nml OB Abdomen:NTND Extremities:L AKA, R BKA w/sock in place Dialysis Access: LU AVF +b/t   Filed Weights   02/06/21 2141 02/07/21 0052 02/07/21 0141  Weight: 73.8 kg 71.8 kg 71.8 kg    Intake/Output Summary (Last 24 hours) at 02/08/2021 1301 Last data filed at 02/08/2021 0904 Gross per 24 hour  Intake 810 ml  Output --  Net 810 ml    Additional Objective Labs: Basic Metabolic Panel: Recent Labs  Lab 02/06/21 0145 02/07/21 0619 02/08/21 0608  NA 128* 132* 130*  K 5.3* 4.3 4.7  CL 84* 93* 91*  CO2 '28 27 24  '$ GLUCOSE 219* 146* 115*  BUN 47* 25* 40*  CREATININE 8.18* 5.36* 7.36*  CALCIUM 10.4* 9.1 9.9  PHOS 6.0* 3.4 4.5   Liver Function Tests: Recent Labs  Lab 02/06/21 0145 02/07/21 0619 02/08/21 0608  ALBUMIN 2.5* 2.6* 2.5*    CBC: Recent Labs  Lab 02/02/21 0107 02/03/21 0049 02/04/21 0112 02/05/21 0322 02/06/21 0145  WBC 8.9 9.5 9.6 8.3 7.7  HGB 9.3* 9.0* 8.6* 8.1* 8.2*  HCT 29.7* 28.0* 27.0* 26.1* 25.6*  MCV 96.1 95.2 94.4 95.3 93.1  PLT 321 324 350 359 421*   CBG: Recent Labs  Lab 02/07/21 1145 02/07/21 1652 02/07/21 2018 02/08/21 0802 02/08/21 1202  GLUCAP 135* 142* 117* 100* 136*     Medications:  sodium chloride     iron sucrose 100 mg (02/04/21 1149)   magnesium sulfate bolus IVPB      (feeding supplement) PROSource Plus  30 mL Oral BID BM   amLODipine  5 mg Oral Daily   aspirin EC  81 mg Oral Daily   atorvastatin  40 mg Oral Daily   bictegravir-emtricitabine-tenofovir AF  1 tablet Oral Daily   bisacodyl  5 mg Oral Daily   calcitRIOL  1 mcg Oral Daily   calcium acetate  2,001 mg Oral TID WC   Chlorhexidine Gluconate Cloth  6 each Topical Daily   Chlorhexidine Gluconate Cloth  6 each Topical Q0600   clopidogrel  75 mg Oral Daily   darbepoetin (ARANESP) injection - DIALYSIS  100 mcg Intravenous Q Sat-HD   feeding supplement (NEPRO CARB STEADY)  237 mL Oral BID BM   gabapentin  100 mg Oral Once per day on Tue Thu Sat   heparin  5,000 Units Subcutaneous Q8H   insulin aspart  0-15 Units Subcutaneous TID WC   insulin glargine  5 Units Subcutaneous QHS   multivitamin  1 tablet Oral QHS   pantoprazole  40 mg Oral Daily   polyethylene glycol  17 g Oral BID   senna  2  tablet Oral BID   sertraline  50 mg Oral QHS   sodium chloride flush  3 mL Intravenous Q12H    Dialysis Orders: [facility]-TTS 4h   75 kg    400/1.5    2/ 2.0 bath  AVF Hep 3000 -Calcitriol 1.25 mcg p.o. q. 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 ESRD: HD TTS - HD today per regular schedule. Tight Heparin.  Hypertension/volume: BP elevated. Continue home meds.  Under EDW, expected post BKA, lower dry on d/c ~71kg. Anemia of ESRD: last Hgb 8.2.-ESA dose scheduled to be given with HD today.  Venofer '100mg'$  qHD x 4 ordered.  Transfuse as needed.   Metabolic bone disease: Phoslo initially increased 3AC due to high phos, Calcium elevated so reduced back to 2AC.  Phos and CCa in goal today. May need to change to non calcium based binder if continues to  have [employer]. Nutrition: Albumin low. renal carb modified diet albumin today protein supplement with prosource. 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  Bed available today.    Jen Mow, PA-C [facility] 02/08/2021,1:01 PM  LOS: 10 days

## 2021-02-08 NOTE — H&P (Signed)
Physical Medicine and Rehabilitation Admission H&P       HPI: Kathy Frank is a 45 year old right-handed female with history of type 2 diabetes mellitus, end-stage renal disease with hemodialysis, anxiety, HIV, quit smoking 6 years ago, left AKA 2020, PVD with multiple revascularization procedures maintained on aspirin and Plavix..  Per chart review patient lives with her children.  1 level home 4 steps to entry.  Use a rolling walker and mobilization as well as wheelchair.  Presented 01/28/2021 with progressive right foot rest pain and nonhealing wounds.  Patient has undergone revascularization procedures to right lower extremity in the past.  Noted progressive ischemic changes limb was not felt to be salvageable underwent right BKA 01/31/2021 per Dr. Trula Slade.  [facility] course pain management.  Hemodialysis ongoing as per renal services.  Subcutaneous heparin for DVT prophylaxis.  Acute on chronic anemia latest hemoglobin 8.2.  Therapy evaluations completed due to patient decreased functional mobility was admitted for a comprehensive rehab program.   Review of Systems Constitutional:  Negative for chills and fever. HENT:  Negative for hearing loss.   Eyes:  Negative for blurred vision and double vision. Respiratory:  Negative for cough and shortness of breath.   Cardiovascular:  Positive for leg swelling. Negative for chest pain and palpitations. Gastrointestinal:  Positive for constipation. Negative for heartburn, nausea and vomiting.       GERD  Genitourinary:  Negative for flank pain and hematuria. Musculoskeletal:  Positive for joint pain and myalgias. Skin:  Negative for rash. Psychiatric/Behavioral:  Positive for depression.        Anxiety  All other systems reviewed and are negative.     Past Medical History:  Diagnosis Date   Anal abscess      chronic   Anxiety     CKD (chronic kidney disease) stage 5, GFR less than 15 ml/min (HCC) 06/19/2016    . Suspect etiologies to  include diabetes and hypertension though cannot exclude HIV-associated nephropathy. Advised her against from using excessive Goody powder.   Constipation      trouble stoll coming out   Depression 06/28/2006    Qualifier: Diagnosis of  By: Riccardo Dubin MD, [place]     Diabetes mellitus type 2 in obese ([place]) 06/28/1994   DKA (diabetic ketoacidoses) 12/2014   Dyspnea      uses oxygeb 2 liters per minute at dialysis   End stage renal disease on dialysis [facility])     Erosive esophagitis     Esophageal reflux     ESRD on hemodialysis ([place]) 05/02/2019   Eye redness     Gastroparesis      ? diabetic   GERD (gastroesophageal reflux disease)     History of blood transfusion 2019   History of hypertension      off bp meds 2020 due to low bp with dialysis   Human immunodeficiency virus (HIV) disease ([place]) [id number]   Metabolic bone disease [id number]   Moderate nonproliferative diabetic retinopathy of both eyes ([place]) 11/21/2014    11/14/14: Noted on retinal imaging; needs follow-up imaging in 6 months  05/22/16: Noted on retinal imaging again; needs follow-up imaging in 6 months   Normocytic anemia     Pruritus, unspecified 05/20/2019   Renal failure (ARF), acute on chronic ([place]) 11/24/2018    tues, thurs sat [address] [facility] since sept 2020   Shortness of breath 07/01/2019    when lays doan at night, musy prop head up on 2 regular and  2 small pillows   Type 2 diabetes mellitus with diabetic peripheral angiopathy without gangrene ([place]) 05/01/2019   Type II diabetes mellitus ([place]) 1994    diagnosed around 1994   Wears dentures      lower   Wound dehiscence     Wound infection s/p L transmetatarsal amputation           Past Surgical History:  Procedure Laterality Date   ABDOMINAL AORTOGRAM W/LOWER EXTREMITY N/A 12/13/2020    Procedure: ABDOMINAL AORTOGRAM W/LOWER EXTREMITY;  Surgeon: Angelia Mould, MD;  Location: [place] [facility];  Service: Cardiovascular;  Laterality: N/A;   AMPUTATION Left  07/08/2018    Procedure: AMPUTATION FORTH RAY LEFT FOOT;  Surgeon: [name], MD;  Location: [place];  Service: Orthopedics;  Laterality: Left;   AMPUTATION Left 08/09/2018    Procedure: Left Transmetatarsal Amputation;  Surgeon: [name], MD;  Location: [place];  Service: Orthopedics;  Laterality: Left;   AMPUTATION Left 10/08/2018    Procedure: LEFT BELOW KNEE AMPUTATION;  Surgeon: [name], MD;  Location: [place];  Service: Orthopedics;  Laterality: Left;   AMPUTATION Left 10/28/2018    Procedure: REVISION BELOW KNEE AMPUTATION;  Surgeon: [name], MD;  Location: [place];  Service: Orthopedics;  Laterality: Left;   AMPUTATION Left 12/16/2018    Procedure: LEFT ABOVE KNEE AMPUTATION;  Surgeon: [name], MD;  Location: [place];  Service: Orthopedics;  Laterality: Left;   AMPUTATION Right 01/31/2021    Procedure: AMPUTATION BELOW KNEE;  Surgeon: Serafina Mitchell, MD;  Location: [place];  Service: Vascular;  Laterality: Right;   AV FISTULA PLACEMENT Left 10/14/2018    Procedure: Arteriovenous (Av) Fistula Creation Left Arm;  Surgeon: [name], MD;  Location: [place];  Service: Vascular;  Laterality: Left;   [place] Left 04/14/2019    Procedure: BASILIC VEIN TRANSPOSITION SECOND STAGE LEFT ARM;  Surgeon: Rosetta Posner, MD;  Location: [place];  Service: Vascular;  Laterality: Left;   INCISION AND DRAINAGE ABSCESS N/A 10/24/2020    Procedure: exicision of hydradenitis;  Surgeon: Leighton Ruff, MD;  Location: [place];  Service: General;  Laterality: N/A;  45 min   INSERTION OF DIALYSIS CATHETER Right 04/14/2019    Procedure: INSERTION OF DIALYSIS CATHETER;  Surgeon: Rosetta Posner, MD;  Location: [place];  Service: Vascular;  Laterality: Right;   LOWER EXTREMITY ANGIOGRAPHY N/A 07/05/2018    Procedure: LOWER EXTREMITY ANGIOGRAPHY;  Surgeon: Serafina Mitchell, MD;  Location: [place] [facility];  Service: Cardiovascular;  Laterality: N/A;   PERIPHERAL  VASCULAR ATHERECTOMY Right 12/13/2020    Procedure: PERIPHERAL VASCULAR ATHERECTOMY;  Surgeon: Angelia Mould, MD;  Location: [place] [facility];  Service: Cardiovascular;  Laterality: Right;  Superficial femoral   PERIPHERAL VASCULAR BALLOON ANGIOPLASTY Left 07/05/2018    Procedure: PERIPHERAL VASCULAR BALLOON ANGIOPLASTY;  Surgeon: Serafina Mitchell, MD;  Location: [place] [facility];  Service: Cardiovascular;  Laterality: Left;  SFA   PERIPHERAL VASCULAR BALLOON ANGIOPLASTY Right 12/13/2020    Procedure: PERIPHERAL VASCULAR BALLOON ANGIOPLASTY;  Surgeon: Angelia Mould, MD;  Location: [place] [facility];  Service: Cardiovascular;  Laterality: Right;  Peroneal artery, anterior tibial artery   RECTAL EXAM UNDER ANESTHESIA N/A 10/24/2020    Procedure: EXAM UNDER ANESTHESIA;  Surgeon: Leighton Ruff, MD;  [facility];  Service: General;  Laterality: N/A;   STUMP REVISION Left 10/19/2018    Procedure:  REVISION LEFT BELOW KNEE AMPUTATION;  Surgeon: [name], MD;  Location: [place];  Service: Orthopedics;  Laterality: Left;   TUBAL LIGATION   2002         Family History  Problem Relation Age of Onset   Diabetes Mother     Diabetes Brother     Diabetes Daughter     Diabetes Daughter     Mental retardation Brother          died from PNA   Diabetes Maternal Grandmother      Social History:  reports that she quit smoking about 6 years ago. Her smoking use included cigarettes. She started smoking about 7 years ago. She has a 1.00 pack-year smoking history. She has never used smokeless tobacco. She reports previous alcohol use. She reports previous drug use. Drug: Marijuana. Allergies: No Known Allergies       Medications Prior to Admission  Medication Sig Dispense Refill   albuterol (VENTOLIN HFA) 108 (90 Base) MCG/ACT inhaler Inhale 2 puffs into the lungs every 4 (four) hours as needed for wheezing or shortness of breath.       aspirin EC 81 MG tablet Take 1  tablet (81 mg total) by mouth daily. Swallow whole. 90 tablet 3   B Complex-C-Folic Acid (RENA-VITE RX) 1 MG TABS Take 1 tablet by mouth daily.       bictegravir-emtricitabine-tenofovir AF (BIKTARVY) 50-200-25 MG TABS tablet TAKE 1 TABLET BY MOUTH DAILY. 30 tablet 3   calcium acetate (PHOSLO) 667 MG capsule Take 2 capsules (1,334 mg total) by mouth 3 (three) times daily with meals. 180 capsule 2   clopidogrel (PLAVIX) 75 MG tablet Take 1 tablet (75 mg total) by mouth daily. 90 tablet 3   diphenhydrAMINE (BENADRYL) 50 MG tablet Take 0.5 tablets (25 mg total) by mouth every 6 (six) hours as needed for itching (As needed for itching). 30 tablet 0   insulin glargine (LANTUS) 100 UNIT/ML Solostar Pen Inject 5 Units into the skin at bedtime. 3 mL 0   loperamide (IMODIUM A-D) 2 MG tablet Take 2 mg by mouth 4 (four) times daily as needed for diarrhea or loose stools.       oxyCODONE-acetaminophen (PERCOCET) 10-325 MG tablet Take 1 tablet by mouth every 6 (six) hours as needed for pain. 15 tablet 0   TUMS 500 MG chewable tablet Chew 2 tablets by mouth at bedtime.       Accu-Chek FastClix Lancets MISC Check blood sugar up to 3 times a day 306 each 3   atorvastatin (LIPITOR) 40 MG tablet Take 1 tablet (40 mg total) by mouth daily. 90 tablet 3   doxycycline (VIBRA-TABS) 100 MG tablet Take 1 tablet (100 mg total) by mouth 2 (two) times daily. (Patient not taking: Reported on 01/29/2021) 20 tablet 1   glucose blood (ACCU-CHEK SMARTVIEW) test strip Check blood sugar 4 times a day as instructed 125 each 12   HEPARIN SODIUM, PORCINE, IV With hemodialysis       iron sucrose in sodium chloride 0.9 % 100 mL Q week with dialysis       Methoxy PEG-Epoetin Beta (MIRCERA IJ) Mircera       mupirocin ointment (BACTROBAN) 2 % Apply 1 application topically 2 (two) times daily. (Patient not taking: Reported on 01/29/2021) 30 g 1   ondansetron (ZOFRAN ODT) 4 MG disintegrating tablet Take 1 tablet (4 mg total) by mouth every 8  (eight) hours as needed for nausea or vomiting. (Patient not  taking: Reported on 01/29/2021) 20 tablet 0   promethazine (PHENERGAN) 25 MG tablet Take 25 mg by mouth 3 (three) times daily as needed. (Patient not taking: Reported on 01/29/2021)       sertraline (ZOLOFT) 50 MG tablet Take 1 tablet (50 mg total) by mouth daily. (Patient not taking: Reported on 01/29/2021) 90 tablet 3   Vitamin D, Ergocalciferol, 50000 units CAPS Take by mouth. (Patient not taking: Reported on 01/29/2021)          Drug Regimen Review Drug regimen was reviewed and remains appropriate with no significant issues identified   Home: Home Living Family/patient expects to be discharged to:: Private residence Living Arrangements: Children Available Help at Discharge: Family, Available PRN/intermittently Type of Home: House Home Access: Stairs to enter [employer] of Steps: 4 Entrance Stairs-Rails: Right, Left, Can reach both Home Layout: One level Bathroom Shower/Tub: Chiropodist: Standard Bathroom Accessibility: Yes Home Equipment: Civil engineer, contracting, Wheelchair - manual, Environmental consultant - 2 wheels   Functional History: Prior Function Level of Independence: Independent with assistive device(s) Comments: uses RW for mobilizatoin and w/c   Functional Status:  Mobility: Bed Mobility Overal bed mobility: Modified Independent Transfers Overall transfer level: Needs assistance Equipment used: None Transfers: Development worker, community transfers: Min assist  Lateral/Scoot Transfers: Min guard General transfer comment: did not perform today Ambulation/Gait General Gait Details: unable   ADL: ADL Overall ADL's : Needs assistance/impaired Eating/Feeding: Set up, Sitting Grooming: Wash/dry hands, Wash/dry face, Sitting Upper Body Bathing: Set up, Sitting Lower Body Bathing: Min guard, Minimal assistance, Sitting/lateral leans Upper Body Dressing : Set up, Sitting Lower Body  Dressing: Min guard, Minimal assistance, Sitting/lateral leans Toilet Transfer: Min guard, Transfer board, Requires drop arm Toilet Transfer Details (indicate cue type and reason): simulated - pt with great ability to lateral scoot between surfaces Toileting- Clothing Manipulation and Hygiene: Min guard, Minimal assistance Tub/ Banker: Minimal assistance, Moderate assistance, Transfer board Functional mobility during ADLs: Min guard General ADL Comments: Pt requested to remain in the bed due to leaving for dialysis soon. performed grooming EOB, and lateral scoots along EOB.   Cognition: Cognition Overall Cognitive Status: Within Functional Limits for tasks assessed Orientation Level: Oriented X4 Cognition Arousal/Alertness: Awake/alert Behavior During Therapy: WFL for tasks assessed/performed Overall Cognitive Status: Within Functional Limits for tasks assessed   Physical Exam: Blood pressure (!) 173/81, pulse 83, temperature 99.3 F (37.4 C), temperature source Oral, resp. rate 18, height '5\' 5"'$  (1.651 m), weight 71.8 kg, SpO2 100 %. Physical Exam Constitutional:      Appearance: She is obese. HENT:    Head: Normocephalic and atraumatic.    Nose: Nose normal. Eyes:    Extraocular Movements: Extraocular movements intact.    Pupils: Pupils are equal, round, and reactive to light. Cardiovascular:    Rate and Rhythm: Normal rate and regular rhythm.    Heart sounds: No murmur heard.   No gallop.    Comments: Left AVF with thrill Pulmonary:    Effort: Pulmonary effort is normal. No respiratory distress.    Breath sounds: No wheezing. Abdominal:    General: There is no distension.    Palpations: Abdomen is soft.    Tenderness: There is no abdominal tenderness. Musculoskeletal:        General: Swelling and tenderness present. Normal range of motion. Skin:    General: Skin is warm.    Comments: Right BKA site is dressed appropriately tender with limb protector in place.  Incision with sl serosanguinous  drainage. Left AKA site well-healed  Neurological:    Mental Status: She is alert.    Comments: Alert and oriented x 3. Normal insight and awareness. Intact Memory. Normal language and speech. Cranial nerve exam unremarkable. Motor 5/5 UE, LLE 5/5 HF and RLE: pt can lift leg and flex/extend agst gravity. No focal sensory findings. DTR's 1+  Psychiatric:        Mood and Affect: Mood normal.        Behavior: Behavior normal.        Thought Content: Thought content normal.        Judgment: Judgment normal.     Lab Results Last 48 Hours        Results for orders placed or performed during the hospital encounter of 01/28/21 (from the past 48 hour(s))  Glucose, capillary     Status: Abnormal    Collection Time: 02/05/21  5:13 PM  Result Value Ref Range    Glucose-Capillary 108 (H) 70 - 99 mg/dL      Comment: Glucose reference range applies only to samples taken after fasting for at least 8 hours.  Glucose, capillary     Status: Abnormal    Collection Time: 02/05/21  8:52 PM  Result Value Ref Range    Glucose-Capillary 153 (H) 70 - 99 mg/dL      Comment: Glucose reference range applies only to samples taken after fasting for at least 8 hours.  CBC     Status: Abnormal    Collection Time: 02/06/21  1:45 AM  Result Value Ref Range    WBC 7.7 4.0 - 10.5 K/uL    RBC 2.75 (L) 3.87 - 5.11 MIL/uL    Hemoglobin 8.2 (L) 12.0 - 15.0 g/dL    HCT 25.6 (L) 36.0 - 46.0 %    MCV 93.1 80.0 - 100.0 fL    MCH 29.8 26.0 - 34.0 pg    MCHC 32.0 30.0 - 36.0 g/dL    RDW 15.0 11.5 - 15.5 %    Platelets 421 (H) 150 - 400 K/uL    nRBC 0.0 0.0 - 0.2 %      Comment: Performed at [facility] [address], [place], [place] [address]  Renal function panel     Status: Abnormal    Collection Time: 02/06/21  1:45 AM  Result Value Ref Range    Sodium 128 (L) 135 - 145 mmol/L    Potassium 5.3 (H) 3.5 - 5.1 mmol/L    Chloride 84 (L) 98 - 111 mmol/L    CO2 28 22 - 32 mmol/L     Glucose, Bld 219 (H) 70 - 99 mg/dL      Comment: Glucose reference range applies only to samples taken after fasting for at least 8 hours.    BUN 47 (H) 6 - 20 mg/dL    Creatinine, Ser 8.18 (H) 0.44 - 1.00 mg/dL    Calcium 10.4 (H) 8.9 - 10.3 mg/dL    Phosphorus 6.0 (H) 2.5 - 4.6 mg/dL    Albumin 2.5 (L) 3.5 - 5.0 g/dL    GFR, Estimated 6 (L) >60 mL/min      Comment: (NOTE) Calculated using the CKD-EPI Creatinine Equation (2021)      Anion gap 16 (H) 5 - 15      Comment: Performed at [facility] [address] [place], [place] [address]  Glucose, capillary     Status: Abnormal    Collection Time: 02/06/21  8:09  AM  Result Value Ref Range    Glucose-Capillary 156 (H) 70 - 99 mg/dL      Comment: Glucose reference range applies only to samples taken after fasting for at least 8 hours.  Glucose, capillary     Status: Abnormal    Collection Time: 02/06/21 11:43 AM  Result Value Ref Range    Glucose-Capillary 113 (H) 70 - 99 mg/dL      Comment: Glucose reference range applies only to samples taken after fasting for at least 8 hours.  Glucose, capillary     Status: Abnormal    Collection Time: 02/06/21  4:47 PM  Result Value Ref Range    Glucose-Capillary 141 (H) 70 - 99 mg/dL      Comment: Glucose reference range applies only to samples taken after fasting for at least 8 hours.  Glucose, capillary     Status: Abnormal    Collection Time: 02/06/21  8:49 PM  Result Value Ref Range    Glucose-Capillary 157 (H) 70 - 99 mg/dL      Comment: Glucose reference range applies only to samples taken after fasting for at least 8 hours.  Renal function panel     Status: Abnormal    Collection Time: 02/07/21  6:19 AM  Result Value Ref Range    Sodium 132 (L) 135 - 145 mmol/L    Potassium 4.3 3.5 - 5.1 mmol/L      Comment: DIALYSIS    Chloride 93 (L) 98 - 111 mmol/L    CO2 27 22 - 32 mmol/L    Glucose, Bld 146 (H) 70 - 99 mg/dL      Comment: Glucose reference range applies only to  samples taken after fasting for at least 8 hours.    BUN 25 (H) 6 - 20 mg/dL    Creatinine, Ser 5.36 (H) 0.44 - 1.00 mg/dL      Comment: DIALYSIS    Calcium 9.1 8.9 - 10.3 mg/dL    Phosphorus 3.4 2.5 - 4.6 mg/dL    Albumin 2.6 (L) 3.5 - 5.0 g/dL    GFR, Estimated 10 (L) >60 mL/min      Comment: (NOTE) Calculated using the CKD-EPI Creatinine Equation (2021)      Anion gap 12 5 - 15      Comment: Performed at [facility] [address] [place], [place] [address]  Glucose, capillary     Status: Abnormal    Collection Time: 02/07/21  7:55 AM  Result Value Ref Range    Glucose-Capillary 172 (H) 70 - 99 mg/dL      Comment: Glucose reference range applies only to samples taken after fasting for at least 8 hours.    Comment 1 QC Due    Glucose, capillary     Status: Abnormal    Collection Time: 02/07/21 11:45 AM  Result Value Ref Range    Glucose-Capillary 135 (H) 70 - 99 mg/dL      Comment: Glucose reference range applies only to samples taken after fasting for at least 8 hours.      Imaging Results (Last 48 hours)  No results found.           Medical Problem List and Plan: 1.  Debility secondary to right BKA 01/31/2021 with history of left AKA             -patient may  shower if right BKA incision is covered             -  ELOS/Goals: 7 days, mod I at w/c level for PT and OT 2.  Antithrombotics: -DVT/anticoagulation: Subcutaneous heparin             -antiplatelet therapy: Aspirin 81 mg daily and Plavix 75 mg daily 3. Pain Management: Neurontin 100 mg 3 times weekly, oxycodone as needed             -massage, edema control right residual limb 4. Mood: Zoloft 50 mg nightly             -antipsychotic agents: N/A 5. Neuropsych: This patient is capable of making decisions on her own behalf. 6. Skin/Wound Care: dry dressing to RLE             -move to shrinker soon 7. Fluids/Electrolytes/Nutrition: Routine in and outs with follow-up chemistries 8. ESRD.  Continue hemodialysis  as directed. HD scheduled after therapies to allow max participation during the day. 9.  Acute on chronic anemia.  Follow-up CBC.  Continue Aranesp 10.  HIV: BIKTARVY 11.  Hyperlipidemia.  Lipitor 12.  Diabetes mellitus.  5 units nightly.  Check blood sugars before meals and at bedtime             -adjust regimen as needed 13.  GERD.  Protonix 14.  Constipation. Small bm's yesterday and today -sorbitol in am -schedule senna-s at bedtime   Cathlyn Parsons, PA-C 02/07/2021   I have personally performed a face to face diagnostic evaluation of this patient and formulated the key components of the plan.  Additionally, I have personally reviewed laboratory data, imaging studies, as well as relevant notes and concur with the physician assistant's documentation above.  The patient's status has not changed from the original H&P.  Any changes in documentation from the acute care chart have been noted above.  Meredith Staggers, MD, [facility]

## 2021-02-08 NOTE — Progress Notes (Signed)
Inpatient Rehab Admissions Coordinator:  There is a bed available for pt to admit to CIR today. Dr. Philipp Ovens is aware and in agreement. NSG, pt, and TOC made aware.   Gayland Curry, [place], [place] Admissions Coordinator [phone]

## 2021-02-08 NOTE — Progress Notes (Signed)
Patient arrived on the unit at 25. Vitals checked. Patient's safety discussed.

## 2021-02-09 DIAGNOSIS — D62 Acute posthemorrhagic anemia: Secondary | ICD-10-CM

## 2021-02-09 DIAGNOSIS — Z89511 Acquired absence of right leg below knee: Secondary | ICD-10-CM | POA: Diagnosis not present

## 2021-02-09 DIAGNOSIS — N186 End stage renal disease: Secondary | ICD-10-CM | POA: Diagnosis not present

## 2021-02-09 DIAGNOSIS — N2581 Secondary hyperparathyroidism of renal origin: Secondary | ICD-10-CM | POA: Diagnosis not present

## 2021-02-09 DIAGNOSIS — Z992 Dependence on renal dialysis: Secondary | ICD-10-CM | POA: Diagnosis not present

## 2021-02-09 DIAGNOSIS — I739 Peripheral vascular disease, unspecified: Secondary | ICD-10-CM | POA: Diagnosis not present

## 2021-02-09 DIAGNOSIS — I120 Hypertensive chronic kidney disease with stage 5 chronic kidney disease or end stage renal disease: Secondary | ICD-10-CM | POA: Diagnosis not present

## 2021-02-09 DIAGNOSIS — I96 Gangrene, not elsewhere classified: Secondary | ICD-10-CM | POA: Diagnosis not present

## 2021-02-09 LAB — GLUCOSE, CAPILLARY
Glucose-Capillary: 166 mg/dL — ABNORMAL HIGH (ref 70–99)
Glucose-Capillary: 250 mg/dL — ABNORMAL HIGH (ref 70–99)
Glucose-Capillary: 73 mg/dL (ref 70–99)
Glucose-Capillary: 136 mg/dL — ABNORMAL HIGH (ref 70–99)

## 2021-02-09 MED ORDER — SORBITOL 70 % SOLN
60.0000 mL | Status: AC
  Administered 2021-02-09: 60 mL via ORAL
  Filled 2021-02-09: qty 60

## 2021-02-09 MED ORDER — DARBEPOETIN ALFA 100 MCG/0.5ML IJ SOSY: 100.0000 ug | PREFILLED_SYRINGE | INTRAMUSCULAR | Status: DC

## 2021-02-09 MED ORDER — SODIUM CHLORIDE 0.9 % IV SOLN
100.0000 mg | INTRAVENOUS | Status: DC
  Administered 2021-02-13 – 2021-02-15 (×2): 100 mg via INTRAVENOUS
  Filled 2021-02-09 (×4): qty 5

## 2021-02-09 NOTE — Discharge Instructions (Addendum)
Inpatient Rehab Discharge Instructions  Kathy Frank Discharge date and time: No discharge date for patient encounter.   Activities/Precautions/ Functional Status: Activity: As tolerated Diet: Renal diet Wound Care: Routine skin checks Functional status:  ___ No restrictions     ___ Walk up steps independently ___ 24/7 supervision/assistance   ___ Walk up steps with assistance ___ Intermittent supervision/assistance  ___ Bathe/dress independently ___ Walk with walker     _x__ Bathe/dress with assistance ___ Walk Independently    ___ Shower independently ___ Walk with assistance    ___ Shower with assistance ___ No alcohol     ___ Return to work/school ________  Special Instructions: No driving smoking or alcohol  Continue hemodialysis as directed    COMMUNITY REFERRALS UPON DISCHARGE:    Home Health:   PT,  RN & OT                Agency: [facility] Phone: [phone]  Medical Equipment/Items Ordered:HOSPITAL BED, 30 TRANSFER BOARD, DROP-ARM BEDSIDE COMMODE RIGHT AMPUTEE PAD AND TUB BENCH                                                 Agency/Supplier:[facility]  [phone]  My questions have been answered and I understand these instructions. I will adhere to these goals and the provided educational materials after my discharge from the hospital.  Patient/Caregiver Signature _______________________________ Date __________  Clinician Signature _______________________________________ Date __________  Please bring this form and your medication list with you to all your follow-up doctor's appointments.

## 2021-02-09 NOTE — Progress Notes (Signed)
Inpatient Rehabilitation Medication Review by a Pharmacist  A complete drug regimen review was completed for this patient to identify any potential clinically significant medication issues.  Clinically significant medication issues were identified:  no  Check AMION for pharmacist assigned to patient if future medication questions/issues arise during this admission.  Pharmacist comments:   Time spent performing this drug regimen review (minutes):  [place] 02/09/2021 7:08 AM

## 2021-02-09 NOTE — Evaluation (Signed)
Occupational Therapy Assessment and Plan  Patient Details  Name: Kathy Frank MRN: [id number] Date of Birth: 1976/06/02  OT Diagnosis: abnormal posture, acute pain, muscle weakness (generalized), and swelling of limb Rehab Potential: Rehab Potential (ACUTE ONLY): Excellent ELOS: 5-7 days   Today's Date: 02/09/2021 OT Individual Time: 2706-2376 and [id number] OT Individual Time Calculation (min): 55 min and 26 min     Hospital Problem: Principal Problem:   Right below-knee amputee [facility])   Past Medical History:  Past Medical History:  Diagnosis Date   Anal abscess    chronic   Anxiety    CKD (chronic kidney disease) stage 5, GFR less than 15 ml/min ([place]) 06/19/2016   . Suspect etiologies to include diabetes and hypertension though cannot exclude HIV-associated nephropathy. Advised her against from using excessive Goody powder.   Constipation    trouble stoll coming out   Depression 06/28/2006   Qualifier: Diagnosis of  By: Riccardo Dubin MD, Todd     Diabetes mellitus type 2 in obese ([place]) 06/28/1994   DKA (diabetic ketoacidoses) 12/2014   Dyspnea    uses oxygeb 2 liters per minute at dialysis   End stage renal disease on dialysis [facility])    Erosive esophagitis    Esophageal reflux    ESRD on hemodialysis ([place]) 05/02/2019   Eye redness    Gastroparesis    ? diabetic   GERD (gastroesophageal reflux disease)    History of blood transfusion 2019   History of hypertension    off bp meds 2020 due to low bp with dialysis   Human immunodeficiency virus (HIV) disease ([place]) 1/60/7371   Metabolic bone disease 0/62/6948   Moderate nonproliferative diabetic retinopathy of both eyes ([place]) 11/21/2014   11/14/14: Noted on retinal imaging; needs follow-up imaging in 6 months  05/22/16: Noted on retinal imaging again; needs follow-up imaging in 6 months   Normocytic anemia    Pruritus, unspecified 05/20/2019   Renal failure (ARF), acute on chronic ([place]) 11/24/2018   tues, thurs sat [address] [facility]  since sept 2020   Shortness of breath 07/01/2019   when lays doan at night, musy prop head up on 2 regular and 2 small pillows   Type 2 diabetes mellitus with diabetic peripheral angiopathy without gangrene ([place]) 05/01/2019   Type II diabetes mellitus ([place]) 1994   diagnosed around 1994   Wears dentures    lower   Wound dehiscence    Wound infection s/p L transmetatarsal amputation    Past Surgical History:  Past Surgical History:  Procedure Laterality Date   ABDOMINAL AORTOGRAM W/LOWER EXTREMITY N/A 12/13/2020   Procedure: ABDOMINAL AORTOGRAM W/LOWER EXTREMITY;  Surgeon: Angelia Mould, MD;  Location: [place] [facility];  Service: Cardiovascular;  Laterality: N/A;   AMPUTATION Left 07/08/2018   Procedure: AMPUTATION FORTH RAY LEFT FOOT;  Surgeon: [name], MD;  Location: [place];  Service: Orthopedics;  Laterality: Left;   AMPUTATION Left 08/09/2018   Procedure: Left Transmetatarsal Amputation;  Surgeon: [name], MD;  Location: [place];  Service: Orthopedics;  Laterality: Left;   AMPUTATION Left 10/08/2018   Procedure: LEFT BELOW KNEE AMPUTATION;  Surgeon: [name], MD;  Location: [place];  Service: Orthopedics;  Laterality: Left;   AMPUTATION Left 10/28/2018   Procedure: REVISION BELOW KNEE AMPUTATION;  Surgeon: [name], MD;  Location: [place];  Service: Orthopedics;  Laterality: Left;   AMPUTATION Left 12/16/2018   Procedure: LEFT ABOVE KNEE AMPUTATION;  Surgeon: [name],  MD;  Location: [place];  Service: Orthopedics;  Laterality: Left;   AMPUTATION Right 01/31/2021   Procedure: AMPUTATION BELOW KNEE;  Surgeon: Serafina Mitchell, MD;  Location: [place];  Service: Vascular;  Laterality: Right;   AV FISTULA PLACEMENT Left 10/14/2018   Procedure: Arteriovenous (Av) Fistula Creation Left Arm;  Surgeon: [name], MD;  Location: [place];  Service: Vascular;  Laterality: Left;   [place] Left 04/14/2019   Procedure: BASILIC VEIN TRANSPOSITION  SECOND STAGE LEFT ARM;  Surgeon: Rosetta Posner, MD;  Location: [place];  Service: Vascular;  Laterality: Left;   INCISION AND DRAINAGE ABSCESS N/A 10/24/2020   Procedure: exicision of hydradenitis;  Surgeon: Leighton Ruff, MD;  Location: [place];  Service: General;  Laterality: N/A;  45 min   INSERTION OF DIALYSIS CATHETER Right 04/14/2019   Procedure: INSERTION OF DIALYSIS CATHETER;  Surgeon: Rosetta Posner, MD;  Location: [place];  Service: Vascular;  Laterality: Right;   LOWER EXTREMITY ANGIOGRAPHY N/A 07/05/2018   Procedure: LOWER EXTREMITY ANGIOGRAPHY;  Surgeon: Serafina Mitchell, MD;  Location: [place] [facility];  Service: Cardiovascular;  Laterality: N/A;   PERIPHERAL VASCULAR ATHERECTOMY Right 12/13/2020   Procedure: PERIPHERAL VASCULAR ATHERECTOMY;  Surgeon: Angelia Mould, MD;  Location: [place] [facility];  Service: Cardiovascular;  Laterality: Right;  Superficial femoral   PERIPHERAL VASCULAR BALLOON ANGIOPLASTY Left 07/05/2018   Procedure: PERIPHERAL VASCULAR BALLOON ANGIOPLASTY;  Surgeon: Serafina Mitchell, MD;  Location: [place] [facility];  Service: Cardiovascular;  Laterality: Left;  SFA   PERIPHERAL VASCULAR BALLOON ANGIOPLASTY Right 12/13/2020   Procedure: PERIPHERAL VASCULAR BALLOON ANGIOPLASTY;  Surgeon: Angelia Mould, MD;  Location: [place] [facility];  Service: Cardiovascular;  Laterality: Right;  Peroneal artery, anterior tibial artery   RECTAL EXAM UNDER ANESTHESIA N/A 10/24/2020   Procedure: EXAM UNDER ANESTHESIA;  Surgeon: Leighton Ruff, MD;  [facility];  Service: General;  Laterality: N/A;   STUMP REVISION Left 10/19/2018   Procedure: REVISION LEFT BELOW KNEE AMPUTATION;  Surgeon: [name], MD;  Location: [place];  Service: Orthopedics;  Laterality: Left;   TUBAL LIGATION  2002    Assessment & Plan Clinical Impression: [name] is a 45 year old right-handed female with history of type 2 diabetes mellitus,  end-stage renal disease with hemodialysis, anxiety, HIV, quit smoking 6 years ago, left AKA 2020, PVD with multiple revascularization procedures maintained on aspirin and Plavix..  Per chart review patient lives with her children.  1 level home 4 steps to entry.  Use a rolling walker and mobilization as well as wheelchair.  Presented 01/28/2021 with progressive right foot rest pain and nonhealing wounds.  Patient has undergone revascularization procedures to right lower extremity in the past.  Noted progressive ischemic changes limb was not felt to be salvageable underwent right BKA 01/31/2021 per Dr. Trula Slade.  [facility] course pain management.  Hemodialysis ongoing as per renal services.  Subcutaneous heparin for DVT prophylaxis.  Acute on chronic anemia latest hemoglobin 8.2. Patient transferred to CIR on 02/08/2021 .    Patient currently requires min-mod with basic self-care skills and IADL secondary to muscle weakness, decreased cardiorespiratoy endurance, and decreased sitting balance, decreased postural control, and decreased balance strategies.  Prior to hospitalization, patient could complete ADLs at modified independent level.  Patient will benefit from skilled intervention to decrease level of assist with basic self-care skills, increase independence with basic self-care skills, and increase level of independence with  iADL prior to discharge home with care partner.  Anticipate patient will require intermittent supervision and follow up home health.  OT - End of Session Activity Tolerance: Tolerates 30+ min activity with multiple rests Endurance Deficit: Yes OT Assessment Rehab Potential (ACUTE ONLY): Excellent OT Barriers to Discharge: Inaccessible home environment;Home environment access/layout;Decreased caregiver support;Weight bearing restrictions;Hemodialysis OT Barriers to Discharge Comments: w/c does not fit in current home's bathroom; family intermittently available for physical  assistance OT Patient demonstrates impairments in the following area(s): Balance;Edema;Endurance;Motor;Pain;Safety;Skin Integrity OT Basic ADL's Functional Problem(s): Bathing;Dressing;Toileting OT Advanced ADL's Functional Problem(s): Simple Meal Preparation;Light Housekeeping OT Transfers Functional Problem(s): Toilet;Tub/Shower OT Plan OT Intensity: Minimum of 1-2 x/day, 45 to 90 minutes OT Frequency: 5 out of 7 days OT Duration/Estimated Length of Stay: 5-7 days OT Treatment/Interventions: Balance/vestibular training;Discharge planning;Pain management;Self Care/advanced ADL retraining;Therapeutic Activities;UE/LE Coordination activities;Disease mangement/prevention;Functional mobility training;Patient/family education;Skin care/wound managment;Therapeutic Exercise;Community reintegration;DME/adaptive equipment instruction;Psychosocial support;UE/LE Strength taining/ROM;Wheelchair propulsion/positioning OT Self Feeding Anticipated Outcome(s): NA OT Basic Self-Care Anticipated Outcome(s): mod I OT Toileting Anticipated Outcome(s): mod I-spvsn OT Bathroom Transfers Anticipated Outcome(s): mod I-spsvsn OT Recommendation Recommendations for Other Services: Neuropsych consult;Therapeutic Recreation consult Therapeutic Recreation Interventions: Clinical cytogeneticist;Outing/community reintergration Patient destination: Home Follow Up Recommendations: Home health OT Equipment Recommended: To be determined;Tub/shower bench;Sliding board Equipment Details: Bariatric drop arm BSC   OT Evaluation Precautions/Restrictions  Precautions Precautions: Fall Precaution Comments: L AKA - has prosthetic but does not use ("too heavy") Required Braces or Orthoses: Other Brace Other Brace: R limb protector, but pt states doesn't want to use 2/2 edema. Restrictions Weight Bearing Restrictions: Yes RLE Weight Bearing: Non weight bearing General Chart Reviewed: Yes Response to Previous  Treatment: Patient reporting fatigue but able to participate Family/Caregiver Present: No Vital Signs Therapy Vitals Temp: 99 F (37.2 C) Pulse Rate: 80 Resp: 17 BP: (!) 161/71 Patient Position (if appropriate): Sitting Oxygen Therapy SpO2: 99 % O2 Device: Room Air Pain Pain Assessment Pain Scale: 0-10 Pain Score: 6  Pain Type: Surgical pain Pain Location: Leg Pain Orientation: Right Pain Descriptors / Indicators: Aching;Sore Pain Frequency: Intermittent Pain Onset: On-going Patients Stated Pain Goal: 0 Pain Intervention(s): Repositioned;Emotional support;Rest;Distraction Multiple Pain Sites: No Home Living/Prior Functioning Home Living Family/patient expects to be discharged to:: Private residence Living Arrangements: Children Available Help at Discharge: Family, Available PRN/intermittently Type of Home: House Home Access: Ramped entrance Entrance Stairs-Number of Steps: 4 Entrance Stairs-Rails: Right, Left, Can reach both Home Layout: One level Bathroom Shower/Tub: Chiropodist: Standard Bathroom Accessibility: Yes Additional Comments: Pt is staying at [employer] house with dtr intermittently; is planning to move with dtr to new home in near future  Lives With: Family IADL History Homemaking Responsibilities: Yes Current License: Yes Mode of Transportation: Car Occupation: Unemployed, On disability Prior Function Level of Independence: Independent with transfers, Requires assistive device for independence, Needs assistance with homemaking  Able to Take Stairs?: No Comments: uses RW for mobilization in house and w/c Vision Baseline Vision/History: Retinopathy Patient Visual Report: No change from baseline Vision Assessment?: No apparent visual deficits Perception  Perception: Within Functional Limits Praxis Praxis: Intact Cognition Overall Cognitive Status: Within Functional Limits for tasks assessed Arousal/Alertness:  Awake/alert Orientation Level: Person;Place;Situation Person: Oriented Place: Oriented Situation: Oriented Year: 2022 Month: July Day of Week: Correct Memory: Appears intact Immediate Memory Recall: Sock;Blue;Bed Memory Recall Sock: Without Cue Memory Recall Blue: Without Cue Memory Recall Bed: Without Cue Attention: Sustained;Focused Focused Attention: Appears intact Sustained Attention: Appears intact Awareness: Appears intact Problem Solving: Appears intact Executive  Function: Sequencing;Decision Making Sequencing: Appears intact Decision Making: Appears intact Safety/Judgment: Appears intact Sensation Sensation Light Touch: Appears Intact Hot/Cold: Appears Intact Proprioception: Appears Intact Stereognosis: Appears Intact Coordination Gross Motor Movements are Fluid and Coordinated: No Fine Motor Movements are Fluid and Coordinated: Yes Coordination and Movement Description: limited by bilateral LE amputations and generalized weakness Motor  Motor Motor: Within Functional Limits Motor - Skilled Clinical Observations: strong UEs, fair balance during transfer practice, some core and BLE weakness  Trunk/Postural Assessment  Cervical Assessment Cervical Assessment: Exceptions to [facility] (forward head) Thoracic Assessment Thoracic Assessment: Exceptions to [facility] (kyphotic posture) Lumbar Assessment Lumbar Assessment: Within Functional Limits Postural Control Postural Control: Deficits on evaluation Trunk Control: decreased 2/2 recent R BKA, COG changes  Balance Balance Balance Assessed: Yes Static Sitting Balance Static Sitting - Balance Support: No upper extremity supported;Bilateral upper extremity supported Static Sitting - Level of Assistance: 5: Stand by assistance;4: Min assist Static Sitting - Comment/# of Minutes: seated EOB and in w/c Dynamic Sitting Balance Dynamic Sitting - Balance Support: Bilateral upper extremity supported;During functional  activity Dynamic Sitting - Level of Assistance: 4: Min assist Dynamic Sitting Balance - Compensations: during functional transfers Dynamic Sitting - Balance Activities: Lateral lean/weight shifting;Forward lean/weight shifting;Trunk control activities Extremity/Trunk Assessment RUE Assessment RUE Assessment: Within Functional Limits LUE Assessment LUE Assessment: Within Functional Limits  Care Tool Care Tool Self Care Eating   Eating Assist Level: Set up assist    Oral Care    Oral Care Assist Level: Supervision/Verbal cueing    Bathing   Body parts bathed by patient: Right arm;Left arm;Chest;Abdomen;Front perineal area;Right upper leg;Left upper leg;Face   Body parts n/a: Right lower leg;Left lower leg Assist Level: Supervision/Verbal cueing    Upper Body Dressing(including orthotics)   What is the patient wearing?: Pull over shirt   Assist Level: Set up assist    Lower Body Dressing (excluding footwear)   What is the patient wearing?: Pants Assist for lower body dressing: Set up assist (bed level)    Putting on/Taking off footwear   What is the patient wearing?:  (ace wrap) Assist for footwear: Dependent - Patient 0%       Care Tool Toileting Toileting activity         Care Tool Bed Mobility Roll left and right activity   Roll left and right assist level: Supervision/Verbal cueing    Sit to lying activity   Sit to lying assist level: Supervision/Verbal cueing    Lying to sitting edge of bed activity   Lying to sitting edge of bed assist level: Supervision/Verbal cueing     Care Tool Transfers Sit to stand transfer Sit to stand activity did not occur: Safety/medical concerns      Chair/bed transfer   Chair/bed transfer assist level: Minimal Assistance - Patient > 75%     Toilet transfer   Assist Level: Minimal Assistance - Patient > 75%     Care Tool Cognition Expression of Ideas and Wants Expression of Ideas and Wants: (P) Without difficulty  (complex and basic) - expresses complex messages without difficulty and with speech that is clear and easy to understand   Understanding Verbal and Non-Verbal Content Understanding Verbal and Non-Verbal Content: (P) Usually understands - understands most conversations, but misses some part/intent of message. Requires cues at times to understand   Memory/Recall Ability *first 3 days only Memory/Recall Ability *first 3 days only: (P) Current season;Location of own room;That he or she is in a hospital/hospital unit  Refer to Care Plan for Long Term Goals  SHORT TERM GOAL WEEK 1 OT Short Term Goal 1 (Week 1): STGs=LTGs due to ELOS.  Recommendations for other services: Neuropsych and Surveyor, mining group, Stress management, and Outing/community reintegration   Skilled Therapeutic Intervention Session 1: Pt received supine in bed, agreeable to OT eval, reporting slight pain in R residual limb around 6 or 7/10. Education on role of OT, rehab process, functional transfers, ADL retraining, DME/AE recommendations, home set up and PLOF. Pt completed bed mobility at spvsn level with use of bed rails. Anterior/posterior transfers bed<>w/c with CGA. Completed ADLs as listed above/below; per pt report had just completed bathing at sink set up A and toileting at [facility]. Re-wrapped ace bandage on R residual limb as it began to come off; pt reported residual limb was swollen and she could not fit the limb protector on at this time. Education on residual limb care and keeping R knee in EXT as much as possible, especially when in w/c using limb support; pt demoed understanding and asked questions. Lateral transfer w/c<>TTB with CGA and use of grab bars. Slide board transfer w/c>BSC min A for balance/safety; requested to complete BSC>w/c transfer as lateral/squatting transfer. Discussion at length about LB clothing management and perihygiene options for [facility] at home; needs reinforcement and some  compensatory strategies in future sessions. Pt moved well throughout session in all transfers, demoing slight fatigue at end of session.   Session 2: Pt received supine in bed, agreeable to OT but requesting bed level activities due to just taking laxative meds and very constipated/uncomfortable. Pt education and participation in [place] with level 3 green theraband to increase endurance and independence in functional mobility/ADLs: shoulder abduction, triceps pulls, diagonal pulls up, forward punches, and scapular retraction (rows) - 2 sets each with 15x then 20x reps. Pt demoed slight fatigue but did not require many rest breaks or vc's for technique. Provided pt with level 4 blue theraband for increased resistance. Discussion of appropriate BLE exercises with theraband with pt demoing understanding. Pt remained supine, alarm set, all needs met.  ADL ADL Eating: Set up Where Assessed-Eating: Bed level Grooming: Modified independent Where Assessed-Grooming: Wheelchair;Sitting at sink Upper Body Bathing: Setup Where Assessed-Upper Body Bathing: Sitting at sink Lower Body Bathing: Setup;Supervision/safety Where Assessed-Lower Body Bathing: Sitting at sink;Wheelchair;Bed level Upper Body Dressing: Setup Where Assessed-Upper Body Dressing: Bed level Lower Body Dressing: Setup (would require min-mod A from seated level) Where Assessed-Lower Body Dressing: Bed level Toileting: Moderate assistance Where Assessed-Toileting: Bedside Commode Toilet Transfer: Therapist, music Method: Theatre manager: Drop arm bedside commode Tub/Shower Transfer: Not assessed Social research officer, government: Curator Method: Other (comment) (lateral transfer) Youth worker: Transfer tub bench;Grab bars Mobility  Bed Mobility Bed Mobility: Sit to Supine;Supine to Sit;Sitting - Scoot to [name] of Bed;Scooting to HOB Supine to Sit: Supervision/Verbal  cueing Sitting - Scoot to Edge of Bed: Contact Guard/Touching assist Sit to Supine: Supervision/Verbal cueing Scooting to [facility]: Supervision/Verbal Cueing   Discharge Criteria: Patient will be discharged from OT if patient refuses treatment 3 consecutive times without medical reason, if treatment goals not met, if there is a change in medical status, if patient makes no progress towards goals or if patient is discharged from hospital.  The above assessment, treatment plan, treatment alternatives and goals were discussed and mutually agreed upon: by patient  Mellissa Kohut 02/09/2021, 4:33 PM

## 2021-02-09 NOTE — Plan of Care (Signed)
  Problem: RH Balance Goal: LTG: Patient will maintain dynamic sitting balance (OT) Description: LTG:  Patient will maintain dynamic sitting balance with assistance during activities of daily living (OT) Flowsheets (Taken 02/09/2021 1645) LTG: Pt will maintain dynamic sitting balance during ADLs with: Independent   Problem: RH Bathing Goal: LTG Patient will bathe all body parts with assist levels (OT) Description: LTG: Patient will bathe all body parts with assist levels (OT) Flowsheets (Taken 02/09/2021 1645) LTG: Pt will perform bathing with assistance level/cueing: Independent with assistive device    Problem: RH Dressing Goal: LTG Patient will perform upper body dressing (OT) Description: LTG Patient will perform upper body dressing with assist, with/without cues (OT). Flowsheets (Taken 02/09/2021 1645) LTG: Pt will perform upper body dressing with assistance level of: Independent with assistive device Goal: LTG Patient will perform lower body dressing w/assist (OT) Description: LTG: Patient will perform lower body dressing with assist, with/without cues in positioning using equipment (OT) Flowsheets (Taken 02/09/2021 1645) LTG: Pt will perform lower body dressing with assistance level of: Independent with assistive device   Problem: RH Toileting Goal: LTG Patient will perform toileting task (3/3 steps) with assistance level (OT) Description: LTG: Patient will perform toileting task (3/3 steps) with assistance level (OT)  Flowsheets (Taken 02/09/2021 1645) LTG: Pt will perform toileting task (3/3 steps) with assistance level: Independent with assistive device   Problem: RH Simple Meal Prep Goal: LTG Patient will perform simple meal prep w/assist (OT) Description: LTG: Patient will perform simple meal prep with assistance, with/without cues (OT). Flowsheets (Taken 02/09/2021 1645) LTG: Pt will perform simple meal prep with assistance level of: Independent with assistive device LTG: Pt will  perform simple meal prep w/level of: Wheelchair level   Problem: RH Light Housekeeping Goal: LTG Patient will perform light housekeeping w/assist (OT) Description: LTG: Patient will perform light housekeeping with assistance, with/without cues (OT). Flowsheets (Taken 02/09/2021 1645) LTG: Pt will perform light housekeeping with assistance level of: Independent with assistive device LTG: Pt will perform light housekeeping w/level of: Wheelchair level   Problem: RH Toilet Transfers Goal: LTG Patient will perform toilet transfers w/assist (OT) Description: LTG: Patient will perform toilet transfers with assist, with/without cues using equipment (OT) Flowsheets (Taken 02/09/2021 1645) LTG: Pt will perform toilet transfers with assistance level of: Independent with assistive device   Problem: RH Tub/Shower Transfers Goal: LTG Patient will perform tub/shower transfers w/assist (OT) Description: LTG: Patient will perform tub/shower transfers with assist, with/without cues using equipment (OT) Flowsheets (Taken 02/09/2021 1645) LTG: Pt will perform tub/shower stall transfers with assistance level of: Independent with assistive device LTG: Pt will perform tub/shower transfers from: Tub/shower combination

## 2021-02-09 NOTE — Evaluation (Signed)
Physical Therapy Assessment and Plan  Patient Details  Name: Kathy Frank MRN: [id number] Date of Birth: March 29, 1976  PT Diagnosis: Abnormal posture, Edema, and Muscle weakness Rehab Potential: Excellent ELOS: 5-7 days   Today's Date: 02/09/2021 PT Individual Time: 0900-1005 PT Individual Time Calculation (min): 65 min  and refused in PM, missed minutes 45.  Hospital Problem: Principal Problem:   Right below-knee amputee [facility])   Past Medical History:  Past Medical History:  Diagnosis Date   Anal abscess    chronic   Anxiety    CKD (chronic kidney disease) stage 5, GFR less than 15 ml/min (HCC) 06/19/2016   . Suspect etiologies to include diabetes and hypertension though cannot exclude HIV-associated nephropathy. Advised her against from using excessive Goody powder.   Constipation    trouble stoll coming out   Depression 06/28/2006   Qualifier: Diagnosis of  By: Riccardo Dubin MD, [place]     Diabetes mellitus type 2 in obese ([place]) 06/28/1994   DKA (diabetic ketoacidoses) 12/2014   Dyspnea    uses oxygeb 2 liters per minute at dialysis   End stage renal disease on dialysis [facility])    Erosive esophagitis    Esophageal reflux    ESRD on hemodialysis ([place]) 05/02/2019   Eye redness    Gastroparesis    ? diabetic   GERD (gastroesophageal reflux disease)    History of blood transfusion 2019   History of hypertension    off bp meds 2020 due to low bp with dialysis   Human immunodeficiency virus (HIV) disease ([place]) 0/93/8182   Metabolic bone disease 9/93/7169   Moderate nonproliferative diabetic retinopathy of both eyes ([place]) 11/21/2014   11/14/14: Noted on retinal imaging; needs follow-up imaging in 6 months  05/22/16: Noted on retinal imaging again; needs follow-up imaging in 6 months   Normocytic anemia    Pruritus, unspecified 05/20/2019   Renal failure (ARF), acute on chronic ([place]) 11/24/2018   tues, thurs sat [address] [facility] since sept 2020   Shortness of breath 07/01/2019   when  lays doan at night, musy prop head up on 2 regular and 2 small pillows   Type 2 diabetes mellitus with diabetic peripheral angiopathy without gangrene ([place]) 05/01/2019   Type II diabetes mellitus ([place]) 1994   diagnosed around 1994   Wears dentures    lower   Wound dehiscence    Wound infection s/p L transmetatarsal amputation    Past Surgical History:  Past Surgical History:  Procedure Laterality Date   ABDOMINAL AORTOGRAM W/LOWER EXTREMITY N/A 12/13/2020   Procedure: ABDOMINAL AORTOGRAM W/LOWER EXTREMITY;  Surgeon: Angelia Mould, MD;  Location: [place] [facility];  Service: Cardiovascular;  Laterality: N/A;   AMPUTATION Left 07/08/2018   Procedure: AMPUTATION FORTH RAY LEFT FOOT;  Surgeon: [name], MD;  Location: [place];  Service: Orthopedics;  Laterality: Left;   AMPUTATION Left 08/09/2018   Procedure: Left Transmetatarsal Amputation;  Surgeon: [name], MD;  Location: [place];  Service: Orthopedics;  Laterality: Left;   AMPUTATION Left 10/08/2018   Procedure: LEFT BELOW KNEE AMPUTATION;  Surgeon: [name], MD;  Location: [place];  Service: Orthopedics;  Laterality: Left;   AMPUTATION Left 10/28/2018   Procedure: REVISION BELOW KNEE AMPUTATION;  Surgeon: [name], MD;  Location: [place];  Service: Orthopedics;  Laterality: Left;   AMPUTATION Left 12/16/2018   Procedure: LEFT ABOVE KNEE AMPUTATION;  Surgeon: [name], MD;  Location: [place];  Service: Orthopedics;  Laterality: Left;   AMPUTATION Right 01/31/2021   Procedure: AMPUTATION BELOW KNEE;  Surgeon: Serafina Mitchell, MD;  Location: [place];  Service: Vascular;  Laterality: Right;   AV FISTULA PLACEMENT Left 10/14/2018   Procedure: Arteriovenous (Av) Fistula Creation Left Arm;  Surgeon: [name], MD;  Location: [place];  Service: Vascular;  Laterality: Left;   [place] Left 04/14/2019   Procedure: BASILIC VEIN TRANSPOSITION SECOND STAGE LEFT ARM;  Surgeon: Rosetta Posner, MD;   Location: [place];  Service: Vascular;  Laterality: Left;   INCISION AND DRAINAGE ABSCESS N/A 10/24/2020   Procedure: exicision of hydradenitis;  Surgeon: Leighton Ruff, MD;  Location: [place];  Service: General;  Laterality: N/A;  45 min   INSERTION OF DIALYSIS CATHETER Right 04/14/2019   Procedure: INSERTION OF DIALYSIS CATHETER;  Surgeon: Rosetta Posner, MD;  Location: [place];  Service: Vascular;  Laterality: Right;   LOWER EXTREMITY ANGIOGRAPHY N/A 07/05/2018   Procedure: LOWER EXTREMITY ANGIOGRAPHY;  Surgeon: Serafina Mitchell, MD;  Location: [place] [facility];  Service: Cardiovascular;  Laterality: N/A;   PERIPHERAL VASCULAR ATHERECTOMY Right 12/13/2020   Procedure: PERIPHERAL VASCULAR ATHERECTOMY;  Surgeon: Angelia Mould, MD;  Location: [place] [facility];  Service: Cardiovascular;  Laterality: Right;  Superficial femoral   PERIPHERAL VASCULAR BALLOON ANGIOPLASTY Left 07/05/2018   Procedure: PERIPHERAL VASCULAR BALLOON ANGIOPLASTY;  Surgeon: Serafina Mitchell, MD;  Location: [place] [facility];  Service: Cardiovascular;  Laterality: Left;  SFA   PERIPHERAL VASCULAR BALLOON ANGIOPLASTY Right 12/13/2020   Procedure: PERIPHERAL VASCULAR BALLOON ANGIOPLASTY;  Surgeon: Angelia Mould, MD;  Location: [place] [facility];  Service: Cardiovascular;  Laterality: Right;  Peroneal artery, anterior tibial artery   RECTAL EXAM UNDER ANESTHESIA N/A 10/24/2020   Procedure: EXAM UNDER ANESTHESIA;  Surgeon: Leighton Ruff, MD;  Location: [facility];  Service: General;  Laterality: N/A;   STUMP REVISION Left 10/19/2018   Procedure: REVISION LEFT BELOW KNEE AMPUTATION;  Surgeon: [name], MD;  Location: [place];  Service: Orthopedics;  Laterality: Left;   TUBAL LIGATION  2002    Assessment & Plan Clinical Impression: Kathy Frank is a 45 year old right-handed female with history of type 2 diabetes mellitus, end-stage renal disease with hemodialysis, anxiety,  HIV, quit smoking 6 years ago, left AKA 2020, PVD with multiple revascularization procedures maintained on aspirin and Plavix..  Per chart review patient lives with her children.  1 level home 4 steps to entry.  Use a rolling walker and mobilization as well as wheelchair.  Presented 01/28/2021 with progressive right foot rest pain and nonhealing wounds.  Patient has undergone revascularization procedures to right lower extremity in the past.  Noted progressive ischemic changes limb was not felt to be salvageable underwent right BKA 01/31/2021 per Dr. Trula Slade.  [facility] course pain management.  Hemodialysis ongoing as per renal services.  Subcutaneous heparin for DVT prophylaxis.  Acute on chronic anemia latest hemoglobin 8.2.  Therapy evaluations completed due to patient decreased functional mobility was admitted for a comprehensive rehab program. Patient currently requires mod with mobility secondary to muscle weakness and decreased sitting balance and decreased postural control.  Prior to hospitalization, patient was modified independent  with mobility and lived with Family in a House home.  Home access is  Ramped entrance (pt states will be D/Cing to mother's house w/ ramped entrance.).  Patient will benefit from skilled PT intervention to maximize safe functional mobility, minimize fall  risk, and decrease caregiver burden for planned discharge home with intermittent assist.  Anticipate patient will benefit from follow up [place] at discharge.  PT - End of Session Activity Tolerance: Tolerates 30+ min activity with multiple rests Endurance Deficit: Yes PT Assessment Rehab Potential (ACUTE/IP ONLY): Excellent PT Barriers to Discharge: Inaccessible home environment PT Barriers to Discharge Comments: ramped entrance PT Patient demonstrates impairments in the following area(s): Pain;Safety;Endurance;Motor;Balance PT Transfers Functional Problem(s): Bed Mobility;Bed to Chair;Car;Furniture PT Locomotion  Functional Problem(s): Wheelchair Mobility PT Plan PT Intensity: Minimum of 1-2 x/day ,45 to 90 minutes PT Frequency: 5 out of 7 days PT Duration Estimated Length of Stay: 5-7 days PT Treatment/Interventions: Discharge planning;DME/adaptive equipment instruction;Functional mobility training;Therapeutic Activities;UE/LE Strength taining/ROM;Balance/vestibular training;Community reintegration;Patient/family education;Therapeutic Exercise;UE/LE Coordination activities;Wheelchair propulsion/positioning PT Transfers Anticipated Outcome(s): mod I PT Locomotion Anticipated Outcome(s): mod I w/c mobility. PT Recommendation Follow Up Recommendations: Home health PT Patient destination: Home Equipment Recommended: To be determined Equipment Details: Pt has RW and w/c at home, tub chair but feels that it is too big for tub.   PT Evaluation Precautions/Restrictions Precautions Precautions: Fall Precaution Comments: L AKA - has prosthetic but does not use ("too heavy") Other Brace: R limb protector, but pt states doesn't want to use 2/2 edema. Restrictions Weight Bearing Restrictions: Yes RLE Weight Bearing: Non weight bearing General Chart Reviewed: Yes Family/Caregiver Present: No Vital Signs Pain Pain Assessment Pain Scale: 0-10 Pain Score: 7  Pain Type: Surgical pain Pain Location: Leg Pain Orientation: Right Pain Descriptors / Indicators: Sharp Pain Onset: On-going Home Living/Prior Functioning Home Living Type of Home: House Home Access: Ramped entrance (pt states will be D/Cing to mother's house w/ ramped entrance.) Bathroom Shower/Tub: Tub/shower unit (Pt states has just been sponge bathing at sink standing w/ RW PTA.)  Lives With: Family Prior Function Level of Independence: Independent with transfers;Requires assistive device for independence  Able to Take Stairs?: No Comments: uses RW for mobilizatoin and w/c Vision/Perception     Cognition Overall Cognitive Status:  Within Functional Limits for tasks assessed Arousal/Alertness: Awake/alert Orientation Level: Oriented X4 Attention: Focused Focused Attention: Appears intact Memory: Appears intact Sensation   Motor  Motor Motor: Within Functional Limits Motor - Skilled Clinical Observations: strong UEs,   Trunk/Postural Assessment  Cervical Assessment Cervical Assessment: Exceptions to [facility] (forward head.) Thoracic Assessment Thoracic Assessment: Exceptions to [facility] (kyphotic posture.) Postural Control Postural Control: Deficits on evaluation Trunk Control: decreased 2/2 recent R BKA, COG changes.  Balance Balance Balance Assessed: Yes Static Sitting Balance Static Sitting - Balance Support: No upper extremity supported;Bilateral upper extremity supported Static Sitting - Level of Assistance: 4: Min assist Dynamic Sitting Balance Dynamic Sitting - Balance Support: Bilateral upper extremity supported Dynamic Sitting - Level of Assistance: 4: Min assist Extremity Assessment      RLE Assessment Active Range of Motion (AROM) Comments: grossly WFL both hip and knee LLE Assessment LLE Assessment: Within Functional Limits (L hip 2/2 pre-morbid L AKA)  Care Tool Care Tool Bed Mobility Roll left and right activity   Roll left and right assist level: Supervision/Verbal cueing    Sit to lying activity   Sit to lying assist level: Supervision/Verbal cueing    Lying to sitting edge of bed activity   Lying to sitting edge of bed assist level: Supervision/Verbal cueing     Care Tool Transfers Sit to stand transfer Sit to stand activity did not occur: Safety/medical concerns      Chair/bed transfer   Chair/bed transfer assist level: Moderate  Assistance - Patient 50 - 74%     Physiological scientist transfer assist level: Moderate Assistance - Patient 50 - 74%      Care Tool Locomotion Ambulation Ambulation activity did not occur: N/A        Walk 10 feet activity  Walk 10 feet activity did not occur: N/A       Walk 50 feet with 2 turns activity Walk 50 feet with 2 turns activity did not occur: N/A      Walk 150 feet activity Walk 150 feet activity did not occur: N/A      Walk 10 feet on uneven surfaces activity Walk 10 feet on uneven surfaces activity did not occur: N/A      Stairs Stair activity did not occur: N/A        Walk up/down 1 step activity Walk up/down 1 step or curb (drop down) activity did not occur: N/A     Walk up/down 4 steps activity did not occuR: N/A  Walk up/down 4 steps activity      Walk up/down 12 steps activity Walk up/down 12 steps activity did not occur: N/A      Pick up small objects from floor Pick up small object from the floor (from standing position) activity did not occur: Safety/medical concerns      Wheelchair Will patient use wheelchair at discharge?: Yes Type of Wheelchair: Manual   Wheelchair assist level: Supervision/Verbal cueing Max wheelchair distance: 70  Wheel 50 feet with 2 turns activity   Assist Level: Supervision/Verbal cueing  Wheel 150 feet activity Wheelchair 150 feet activity did not occur: Refused      Refer to Care Plan for Long Term Goals  SHORT TERM GOAL WEEK 1 PT Short Term Goal 1 (Week 1): LTG=STGs due to ELOS.  Recommendations for other services: None   Skilled Therapeutic Intervention Evaluation completed (see details above and below) with education on PT POC and goals and individual treatment initiated with focus on transfers, w/c mobility trunk strengthening/balance.    First session:  Pt presents sitting in bed and agreeable to therapy.  Pt transfers sup <> sit w/ supervision and able to don scrubs w/ supervision in supine and lifting hips unilaterally to pull up over hips.  Pt states interest in performing transfers using SB.  Pt educated on placement and use for improved balance 2/2 change in COG s/p recent R BKA.  Pt able to lean away and min to mod A for correct  placement under hips.  Pt required min A for SB transfer bed > w/c, then w/c<> mat table.  Pt transferred mat > w/c w/ min to mod for upward slant.  Pt able to negotiate w/c x 70 w/ supervision into hallways before fatiguing.  PT wheeled remaining distance to small gym for car transfer.  Pt required mod A in/out of simulated car w/ door completely open.  Pt wheeled 36' w/ supervision before requiring PT assist.  Pt performed SB transfer w/c > recliner w/ mod A (no drop arm recliner).  Pt left w/ NT for bath.  Second session:   PT enters room and pt just transferred [facility] > bed and states "not feeling well".  Pt refuses at this time and PT will return as able.  Commode emptied by family member and cleaned by PT.  Pt remained long sitting and all needs in reach, family in room. Mobility Bed Mobility Bed Mobility:  Sit to Supine;Supine to Sit;Sitting - Scoot to Edge of Bed Supine to Sit: Supervision/Verbal cueing Sitting - Scoot to Edge of Bed: Contact Guard/Touching assist (for change in COG 2/2 recent R BKA.) Sit to Supine: Supervision/Verbal cueing Transfers Transfers: Transfer (w/ slide board.) Transfer (Assistive device): Other (Comment) (slide board) Locomotion  Gait Ambulation: No Gait Gait: No Stairs / Additional Locomotion Ramp: Moderate Assistance - Patient 50 - 74% Wheelchair Mobility Wheelchair Mobility: Yes Wheelchair Assistance: Chartered loss adjuster: Both upper extremities Wheelchair Parts Management: Needs assistance Distance: 70   Discharge Criteria: Patient will be discharged from PT if patient refuses treatment 3 consecutive times without medical reason, if treatment goals not met, if there is a change in medical status, if patient makes no progress towards goals or if patient is discharged from hospital.  The above assessment, treatment plan, treatment alternatives and goals were discussed and mutually agreed upon: by patient  Ladoris Gene 02/09/2021, 12:23 PM

## 2021-02-09 NOTE — Progress Notes (Signed)
Signed                                                                                                                                                                                                                                                                                                                                                                                                                                                                                                                                   PMR Admission Coordinator Pre-Admission Assessment   Patient: Kathy Frank is an 45 y.o., female MRN: [id number] DOB: [birth year] Height: _0  (165.1 cm) Weight: 71.8 kg   Insurance Information HMO:  yes   PPO:      PCP:      IPA:      80/20: yes     OTHER: PRIMARY: UHC Medicare      Policy#: [id number]         Subscriber: Pt CM Name: n/a  Phone#:      Fax#: [phone] Pre-Cert#: [id number]   appealed.  Approval received from [address] on 02/07/21 for 7 days with review on 02/12/21,  Authorization ID: [id number].   Employer: n/a  Online - [email or web address] Eff Date: 10/08/2020 - still active Deductible: $0 OOP Max: $7,550 ($0 met) CIR: $1480 admission copay SNF: $0.00 Copayment per day for days 1-20; $194.50 Copayment per day for days 21-100; Maximum of 100 days/benefit period Outpatient: 100% coverage Home Health:  100% coverage, 0% co-insurance; limited by medical necessity DME: 80% coverage; 20% co-insurance Providers: In network    SECONDARY: Medicaid       Policy#: [id number]     Phone#:   Financial Counselor:       Phone#:   The "Data Collection Information Summary" for patients in Inpatient Rehabilitation Facilities with attached "Privacy Act [place] Records" was provided and verbally  reviewed with: Patient   Emergency Contact Information Contact Information       Name Relation Home Work Smock     [phone]    Claudine Mouton Mother     [phone]           Current Medical History  Patient Admitting Diagnosis: BKA History of Present Illness: 45 y/o female presented to ED on 6/22 for R foot pain and difficulty weight bearing with concerns of gangrene. 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 PMH: anxiety, ESRD on HD, depression, HTN, HIV, type 2 DM, L AKA. Postoperative pain management and constipation.   Patient's medical record from [facility] has been reviewed by the rehabilitation admission coordinator and physician.   Past Medical History      Past Medical History:  Diagnosis Date   Anal abscess      chronic   Anxiety     CKD (chronic kidney disease) stage 5, GFR less than 15 ml/min (HCC) 06/19/2016    . Suspect etiologies to include diabetes and hypertension though cannot exclude HIV-associated nephropathy. Advised her against from using excessive Goody powder.   Constipation      trouble stoll coming out   Depression 06/28/2006    Qualifier: Diagnosis of  By: Riccardo Dubin MD, Todd     Diabetes mellitus type 2 in obese ([place]) 06/28/1994   DKA (diabetic ketoacidoses) 12/2014   Dyspnea      uses oxygeb 2 liters per minute at dialysis   End stage renal disease on dialysis [facility])     Erosive esophagitis     Esophageal reflux     ESRD on hemodialysis ([place]) 05/02/2019   Eye redness     Gastroparesis      ? diabetic   GERD (gastroesophageal reflux disease)     History of blood transfusion 2019   History of hypertension      off bp meds 2020 due to low bp with dialysis   Human immunodeficiency virus (HIV) disease ([place]) 8/45/3646   Metabolic bone disease 03/12/2121   Moderate nonproliferative diabetic retinopathy of both eyes ([place]) 11/21/2014    11/14/14: Noted on retinal imaging; needs follow-up imaging in 6 months  05/22/16:  Noted on retinal imaging again; needs follow-up imaging in 6 months   Normocytic anemia     Pruritus, unspecified 05/20/2019   Renal failure (ARF), acute on chronic ([place]) 11/24/2018    tues, thurs sat [address] hs since sept 2020   Shortness of breath 07/01/2019    when lays doan at night, musy prop head up on 2 regular  and 2 small pillows   Type 2 diabetes mellitus with diabetic peripheral angiopathy without gangrene ([place]) 05/01/2019   Type II diabetes mellitus ([place]) 1994    diagnosed around 1994   Wears dentures      lower   Wound dehiscence     Wound infection s/p L transmetatarsal amputation        Family History   family history includes Diabetes in her brother, daughter, daughter, maternal grandmother, and mother; Mental retardation in her brother.   Prior Rehab/Hospitalizations Has the patient had prior rehab or hospitalizations prior to admission? Yes   Has the patient had major surgery during 100 days prior to admission? Yes              Current Medications   Current Facility-Administered Medications:   (feeding supplement) PROSource Plus liquid 30 mL, 30 mL, Oral, BID BM, Valentina Gu, NP, 30 mL at 02/07/21 1500   0.9 %  sodium chloride infusion, 250 mL, Intravenous, PRN, Theda Sers, Emma M, PA-C   acetaminophen (TYLENOL) tablet 650 mg, 650 mg, Oral, Q6H PRN, 650 mg at 01/31/21 6606 **OR** acetaminophen (TYLENOL) suppository 650 mg, 650 mg, Rectal, Q6H PRN, Laurence Slate M, PA-C   alteplase (CATHFLO ACTIVASE) injection 2 mg, 2 mg, Intracatheter, Once PRN, Theda Sers, Emma M, PA-C   amLODipine (NORVASC) tablet 5 mg, 5 mg, Oral, Daily, [name], MD, 5 mg at 02/07/21 1849   aspirin EC tablet 81 mg, 81 mg, Oral, Daily, Laurence Slate M, PA-C, 81 mg at 02/07/21 1139   atorvastatin (LIPITOR) tablet 40 mg, 40 mg, Oral, Daily, Laurence Slate M, PA-C, 40 mg at 02/07/21 1140   bictegravir-emtricitabine-tenofovir AF (BIKTARVY) 50-200-25 MG per tablet 1 tablet, 1 tablet, Oral,  Daily, Theda Sers, Emma M, PA-C, 1 tablet at 02/07/21 1140   bisacodyl (DULCOLAX) EC tablet 5 mg, 5 mg, Oral, Daily, [name], MD   calcitRIOL (ROCALTROL) capsule 1 mcg, 1 mcg, Oral, Daily, Valentina Gu, NP, 1 mcg at 02/07/21 1141   calcium acetate (PHOSLO) capsule 2,001 mg, 2,001 mg, Oral, TID WC, Adelfa Koh, NP, 2,001 mg at 02/07/21 1759   camphor-menthol (SARNA) lotion 1 application, 1 application, Topical, PRN, Penninger, Ria Comment, PA   Chlorhexidine Gluconate Cloth 2 % PADS 6 each, 6 each, Topical, Daily, Ulyses Amor, PA-C, 6 each at 02/07/21 1142   Chlorhexidine Gluconate Cloth 2 % PADS 6 each, 6 each, Topical, Q0600, Penninger, Ria Comment, PA, 6 each at 02/08/21 0423   clopidogrel (PLAVIX) tablet 75 mg, 75 mg, Oral, Daily, Velna Ochs, MD, 75 mg at 02/07/21 1801   Darbepoetin Alfa (ARANESP) injection 100 mcg, 100 mcg, Intravenous, Q Sat-HD, Velna Ochs, MD   feeding supplement (NEPRO CARB STEADY) liquid 237 mL, 237 mL, Oral, BID BM, Collins, Emma M, PA-C, 237 mL at 02/07/21 1600   gabapentin (NEURONTIN) capsule 100 mg, 100 mg, Oral, Once per day on Tue Thu Sat, Doda, Vandana, MD, 100 mg at 02/04/21 1810   guaiFENesin-dextromethorphan (ROBITUSSIN DM) 100-10 MG/5ML syrup 15 mL, 15 mL, Oral, Q4H PRN, Laurence Slate M, PA-C   heparin injection 1,000 Units, 1,000 Units, Dialysis, PRN, Laurence Slate M, PA-C   heparin injection 5,000 Units, 5,000 Units, Subcutaneous, Q8H, Collins, Emma M, PA-C, 5,000 Units at 02/08/21 0423   HYDROmorphone (DILAUDID) injection 0.5 mg, 0.5 mg, Intravenous, Q8H PRN, [name], MD   insulin aspart (novoLOG) injection 0-15 Units, 0-15 Units, Subcutaneous, TID WC, Collins, Emma M, PA-C, 2 Units at 02/07/21 1900   insulin  glargine (LANTUS) injection 5 Units, 5 Units, Subcutaneous, QHS, Collins, Emma M, [place], 5 Units at 02/07/21 2051   iron sucrose (VENOFER) 100 mg in sodium chloride 0.9 % 100 mL IVPB, 100 mg, Intravenous, Q T,Th,Sa-HD,  Valentina Gu, NP, Last Rate: 420 mL/hr at 02/04/21 1149, 100 mg at 02/04/21 1149   lidocaine (PF) (XYLOCAINE) 1 % injection 5 mL, 5 mL, Intradermal, PRN, Theda Sers, Emma M, PA-C   lidocaine-prilocaine (EMLA) cream 1 application, 1 application, Topical, PRN, Theda Sers, Emma M, PA-C   magnesium sulfate IVPB 2 g 50 mL, 2 g, Intravenous, Daily PRN, Theda Sers, Emma M, PA-C   metoprolol tartrate (LOPRESSOR) injection 2-5 mg, 2-5 mg, Intravenous, Q2H PRN, Theda Sers, Emma M, PA-C   multivitamin (RENA-VIT) tablet 1 tablet, 1 tablet, Oral, QHS, Collins, Emma M, PA-C, 1 tablet at 02/07/21 2051   ondansetron [facility]) injection 4 mg, 4 mg, Intravenous, Q6H PRN, Laurence Slate M, PA-C, 4 mg at 02/05/21 1559   oxyCODONE (Oxy IR/ROXICODONE) immediate release tablet 5 mg, 5 mg, Oral, Q6H PRN, [name], MD, 5 mg at 02/08/21 0421   pantoprazole (PROTONIX) EC tablet 40 mg, 40 mg, Oral, Daily, Laurence Slate M, PA-C, 40 mg at 02/07/21 1139   pentafluoroprop-tetrafluoroeth (GEBAUERS) aerosol 1 application, 1 application, Topical, PRN, Theda Sers, Emma M, PA-C   phenol (CHLORASEPTIC) mouth spray 1 spray, 1 spray, Mouth/Throat, PRN, Theda Sers, Emma M, PA-C   polyethylene glycol (MIRALAX / GLYCOLAX) packet 17 g, 17 g, Oral, BID, [name], MD, 17 g at 02/07/21 2053   senna (SENOKOT) tablet 17.2 mg, 2 tablet, Oral, BID, [name], MD, 17.2 mg at 02/07/21 2051   sertraline (ZOLOFT) tablet 50 mg, 50 mg, Oral, QHS, Collins, Emma M, PA-C, 50 mg at 02/07/21 2051   sodium chloride flush (NS) 0.9 % injection 3 mL, 3 mL, Intravenous, Q12H, Collins, Emma M, PA-C, 3 mL at 02/06/21 0841   sodium chloride flush (NS) 0.9 % injection 3 mL, 3 mL, Intravenous, PRN, Ulyses Amor, PA-C   Patients Current Diet:  Diet Order                  Diet Carb Modified Fluid consistency: Thin; Room service appropriate? Yes  Diet effective now             Diet - low sodium heart healthy                       Precautions /  Restrictions Precautions Precautions: Fall Precaution Comments: L AKA - has prosthetic but does not use ("too heavy") Other Brace: R limb protector Restrictions Weight Bearing Restrictions: Yes RLE Weight Bearing: Non weight bearing    Has the patient had 2 or more falls or a fall with injury in the past year? No   Prior Activity Level Limited Community (1-2x/wk): Pt. was active in the community PTA   Prior Functional Level Self Care: Did the patient need help bathing, dressing, using the toilet or eating? Independent   Indoor Mobility: Did the patient need assistance with walking from room to room (with or without device)? Independent   Stairs: Did the patient need assistance with internal or external stairs (with or without device)? Independent   Functional Cognition: Did the patient need help planning regular tasks such as shopping or remembering to take medications? Independent   Home Equities trader / Equipment Home Assistive Devices/Equipment: None Home Equipment: Shower seat, Wheelchair - manual, Environmental consultant - 2 wheels   Prior Device Use:  Indicate devices/aids used by the patient prior to current illness, exacerbation or injury? None of the above   Current Functional Level Cognition   Overall Cognitive Status: Within Functional Limits for tasks assessed Orientation Level: Oriented X4    Extremity Assessment (includes Sensation/Coordination)   Upper Extremity Assessment: Overall WFL for tasks assessed  Lower Extremity Assessment: Defer to PT evaluation (L AKA, new R BKA) RLE: Unable to fully assess due to pain     ADLs   Overall ADL's : Needs assistance/impaired Eating/Feeding: Set up, Sitting Grooming: Wash/dry hands, Wash/dry face, Sitting Upper Body Bathing: Set up, Sitting Lower Body Bathing: Min guard, Minimal assistance, Sitting/lateral leans Upper Body Dressing : Set up, Sitting Lower Body Dressing: Min guard, Minimal assistance, Sitting/lateral leans Toilet  Transfer: Min guard, Transfer board, Requires drop arm Toilet Transfer Details (indicate cue type and reason): simulated - pt with great ability to lateral scoot between surfaces Toileting- Clothing Manipulation and Hygiene: Min guard, Minimal assistance Tub/ Banker: Minimal assistance, Moderate assistance, Transfer board Functional mobility during ADLs: Min guard General ADL Comments: Pt requested to remain in the bed due to leaving for dialysis soon. performed grooming EOB, and lateral scoots along EOB.     Mobility   Overal bed mobility: Modified Independent     Transfers   Overall transfer level: Needs assistance Equipment used: None Transfers: Lateral/Scoot Transfers Anterior-Posterior transfers: Min assist  Lateral/Scoot Transfers: Min guard General transfer comment: did not perform today     Ambulation / Gait / Stairs / Emergency planning/management officer   Ambulation/Gait General Gait Details: unable     Posture / Balance Dynamic Sitting Balance Sitting balance - Comments: fair once up to side Balance Overall balance assessment: Needs assistance Sitting-balance support:  (long sits on the bed) Sitting balance-Leahy Scale: Fair Sitting balance - Comments: fair once up to side Standing balance comment: Unable     Special needs/care consideration Dialysis: Hemodialysis Tuesday, Thursday, and Saturday    Previous Home Environment  Living Arrangements: Children Available Help at Discharge: Family, Available PRN/intermittently Type of Home: House Home Layout: One level Home Access: Stairs to enter Entrance Stairs-Rails: Right, Left, Can reach both Entrance Stairs-Number of Steps: 4 Bathroom Shower/Tub: Chiropodist: Standard Bathroom Accessibility: Yes How Accessible: Accessible via walker [place]: No   Discharge Living Setting Plans for Discharge Living Setting: Patient's home Type of Home at Discharge: House Discharge Home Layout: One  level Discharge Home Access: Stairs to enter Entrance Stairs-Rails: Right, Left, Can reach both Entrance Stairs-Number of Steps: 4 Discharge Bathroom Shower/Tub: Tub/shower unit Discharge Bathroom Toilet: Standard Discharge Bathroom Accessibility: Yes How Accessible: Accessible via walker, Accessible via wheelchair Does the patient have any problems obtaining your medications?: No   Social/Family/Support Systems Patient Roles: Parent Contact Information: [phone] Anticipated Caregiver: Demetris Gareth Eagle Anticipated Caregiver's Contact Information: [phone] Ability/Limitations of Caregiver: Can do supervision A Caregiver Availability: 24/7 Discharge Plan Discussed with Primary Caregiver: Yes Is Caregiver In Agreement with Plan?: Yes Does Caregiver/Family have Issues with Lodging/Transportation while Pt is in Rehab?: Yes   Goals Patient/Family Goal for Rehab: PT/TOT Mod I, wheelchair level Expected length of stay: 5-7 days Pt/Family Agrees to Admission and willing to participate: Yes Program Orientation Provided & Reviewed with Pt/Caregiver Including Roles  & Responsibilities: Yes   Decrease burden of Care through IP rehab admission: Specialzed equipment needs, Decrease number of caregivers, and Patient/family education   Possible need for SNF placement upon discharge: not anticipated    Patient Condition: I  have reviewed medical records from [facility] , spoken with CM, and patient and family member. I met with patient at the bedside and discussed via phone for inpatient rehabilitation assessment.  Patient will benefit from ongoing PT, OT, and SLP, can actively participate in 3 hours of therapy a day 5 days of the week, and can make measurable gains during the admission.  Patient will also benefit from the coordinated team approach during an Inpatient Acute Rehabilitation admission.  The patient will receive intensive therapy as well as Rehabilitation physician,  nursing, social worker, and care management interventions.  Due to safety, skin/wound care, disease management, medication administration, pain management, and patient education the patient requires 24 hour a day rehabilitation nursing.  The patient is currently Min G-Mod I with mobility and Min G with basic ADLs.  Discharge setting and therapy post discharge at home with home health is anticipated.  Patient has agreed to participate in the Acute Inpatient Rehabilitation Program and will admit today.   Preadmission Screen Completed DG:UYQIH Staley with updates by Danne Baxter, RN and Gayland Curry, 02/08/2021 9:40 AM ______________________________________________________________________   Discussed status with Dr. Naaman Plummer on 02/08/21  at 9:40 AM and received approval for admission today.   Admission Coordinator:  Clemens Catholic with updates by Danne Baxter, RN and Gayland Curry, MS, CCC-SLP, time 9:40 AM/Date 02/08/21     Assessment/Plan: Diagnosis: Right BKA Does the need for close, 24 hr/day Medical supervision in concert with the patient's rehab needs make it unreasonable for this patient to be served in a less intensive setting? Yes Co-Morbidities requiring supervision/potential complications: ESRD on HD, HTN, HIV, DM, L AKA Due to bladder management, bowel management, safety, skin/wound care, disease management, medication administration, pain management, and patient education, does the patient require 24 hr/day rehab nursing? Yes Does the patient require coordinated care of a physician, rehab nurse, PT, OT to address physical and functional deficits in the context of the above medical diagnosis(es)? Yes Addressing deficits in the following areas: balance, endurance, locomotion, strength, transferring, bowel/bladder control, bathing, dressing, feeding, grooming, toileting, and psychosocial support Can the patient actively participate in an intensive therapy program of at least 3  hrs of therapy 5 days a week? Yes The potential for patient to make measurable gains while on inpatient rehab is excellent Anticipated functional outcomes upon discharge from inpatient rehab: modified independent PT, modified independent OT, n/a SLP Estimated rehab length of stay to reach the above functional goals is: 5-7days Anticipated discharge destination: Home 10. Overall Rehab/Functional Prognosis: excellent     MD Signature: Meredith Staggers, MD, [facility] 02/08/2021

## 2021-02-09 NOTE — Progress Notes (Addendum)
[facility] Progress Note   Subjective:   Patient seen and examined at bedside.  Wounds on R BKA draining, dressing changed this AM.  Continues to have stump pain and swelling.  No other specific complaints.  Denies CP, SOB, n/v/d.  Tolerated dialysis well last night.   Objective Vitals:   02/08/21 1745 02/08/21 2122 02/09/21 0441 02/09/21 0500  BP: (!) 170/86  (!) 147/66   Pulse: 87  80   Resp: 16  18   Temp: 98 F (36.7 C)  98.3 F (36.8 C)   TempSrc:   Oral   SpO2: 100%  99%   Weight:  69.5 kg  69 kg   Physical Exam General:chronically ill appearing female in NAD Heart:RRR, no mrg Lungs:CTAB, nml WOB on RA Abdomen:soft, NTND Extremities:L AKA - no edema, R BKA dressed  Dialysis Access: LU AVF +b/t   Filed Weights   02/08/21 2122 02/09/21 0500  Weight: 69.5 kg 69 kg    Intake/Output Summary (Last 24 hours) at 02/09/2021 1029 Last data filed at 02/08/2021 1745 Gross per 24 hour  Intake --  Output 3000 ml  Net -3000 ml    Additional Objective Labs: Basic Metabolic Panel: Recent Labs  Lab 02/06/21 0145 02/07/21 0619 02/08/21 0608  NA 128* 132* 130*  K 5.3* 4.3 4.7  CL 84* 93* 91*  CO2 '28 27 24  '$ GLUCOSE 219* 146* 115*  BUN 47* 25* 40*  CREATININE 8.18* 5.36* 7.36*  CALCIUM 10.4* 9.1 9.9  PHOS 6.0* 3.4 4.5   Liver Function Tests: Recent Labs  Lab 02/06/21 0145 02/07/21 0619 02/08/21 0608  ALBUMIN 2.5* 2.6* 2.5*    CBC: Recent Labs  Lab 02/03/21 0049 02/04/21 0112 02/05/21 0322 02/06/21 0145 02/08/21 1439  WBC 9.5 9.6 8.3 7.7 9.4  HGB 9.0* 8.6* 8.1* 8.2* 7.6*  HCT 28.0* 27.0* 26.1* 25.6* 24.2*  MCV 95.2 94.4 95.3 93.1 94.2  PLT 324 350 359 421* 458*   Blood Culture    Component Value Date/Time   SDES BLOOD RIGHT ANTECUBITAL 01/28/2021 1812   SPECREQUEST  01/28/2021 1812    BOTTLES DRAWN AEROBIC AND ANAEROBIC Blood Culture adequate volume   CULT  01/28/2021 1812    NO GROWTH 5 DAYS Performed at [facility],  [address] [place], [place] [address]    REPTSTATUS 02/02/2021 FINAL 01/28/2021 1812    CBG: Recent Labs  Lab 02/07/21 2018 02/08/21 0802 02/08/21 1202 02/08/21 2057 02/09/21 0610  GLUCAP 117* 100* 136* 136* 166*    Medications:   aspirin EC  81 mg Oral Daily   atorvastatin  40 mg Oral Daily   bictegravir-emtricitabine-tenofovir AF  1 tablet Oral Daily   bisacodyl  5 mg Oral Daily   calcitRIOL  1 mcg Oral Daily   calcium acetate  2,001 mg Oral TID WC   clopidogrel  75 mg Oral Daily   [START ON 02/15/2021] darbepoetin (ARANESP) injection - DIALYSIS  100 mcg Intravenous Q Sat-HD   feeding supplement (NEPRO CARB STEADY)  237 mL Oral BID BM   gabapentin  100 mg Oral Once per day on Tue Thu Sat   heparin  5,000 Units Subcutaneous Q8H   insulin aspart  0-15 Units Subcutaneous TID WC   insulin glargine  5 Units Subcutaneous QHS   multivitamin  1 tablet Oral QHS   pantoprazole  40 mg Oral Daily   polyethylene glycol  17 g Oral BID   senna  2 tablet Oral BID  sertraline  50 mg Oral QHS   sorbitol  60 mL Oral NOW    Dialysis Orders: [facility]-TTS 4h   75 kg    400/1.5    2/ 2.0 bath  AVF Hep 3000 -Calcitriol 1.25 mcg p.o. q. 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 ESRD: HD TTS - Next HD 02/11/21.  Hold heparin due to oozing from BKA and drop in Hgb. Hypertension/volume: BP improved this AM.  Continue home meds.  Under EDW, expected post BKA, lower dry on d/c ~69-70kg.   Anemia of ESRD: last drop 8.2>7.6 Hold heparin. Continue Aranesp 13mg qSat.  Venofer '100mg'$  qHD x 4 ordered.  Transfuse as needed.   Metabolic bone disease: Phoslo initially increased 3AC due to high phos, Calcium elevated so reduced back to 2AC.  Phos and CCa in goal today. May need to change to non calcium based binder if continues to have ^[employer] Nutrition: Albumin low. renal carb modified diet albumin today protein supplement with prosource. HIV-meds per primary Diabetes mellitus type 2-per primary Depressive disorder on sertraline per primary Disposition: Now in CIR.  LJen Mow PA-C [facility] 02/09/2021,10:29 AM  LOS: 1 day

## 2021-02-09 NOTE — Progress Notes (Signed)
PROGRESS NOTE   Subjective/Complaints: Pt experienced more throbbing pain in right leg last night. Wound drained as well. She did manage to sleep  ROS: Patient denies fever, rash, sore throat, blurred vision, nausea, vomiting, diarrhea, cough, shortness of breath or chest pain,   headache, or mood change.    Objective:   No results found. Recent Labs    02/08/21 1439  WBC 9.4  HGB 7.6*  HCT 24.2*  PLT 458*   Recent Labs    02/07/21 0619 02/08/21 0608  NA 132* 130*  K 4.3 4.7  CL 93* 91*  CO2 27 24  GLUCOSE 146* 115*  BUN 25* 40*  CREATININE 5.36* 7.36*  CALCIUM 9.1 9.9    Intake/Output Summary (Last 24 hours) at 02/09/2021 1009 Last data filed at 02/08/2021 1745 Gross per 24 hour  Intake --  Output 3000 ml  Net -3000 ml        Physical Exam: Vital Signs Blood pressure (!) 147/66, pulse 80, temperature 98.3 F (36.8 C), temperature source Oral, resp. rate 18, weight 69 kg, SpO2 99 %.  General: Alert and oriented x 3, No apparent distress. obese HEENT: Head is normocephalic, atraumatic, PERRLA, EOMI, sclera anicteric, oral mucosa pink and moist, dentition intact, ext ear canals clear,  Neck: Supple without JVD or lymphadenopathy Heart: Reg rate and rhythm. No murmurs rubs or gallops Chest: CTA bilaterally without wheezes, rales, or rhonchi; no distress Abdomen: Soft, non-tender, non-distended, bowel sounds positive. Extremities: No clubbing, cyanosis, or edema. Pulses are 2+ Psych: Pt's affect is appropriate. Pt is cooperative Skin: left AKA site well healed. Right BKA incision well approximated. An area or two where there is mild s/s discharge. A few small areas along the incision where there are discrete ischemic changes Neuro: Pt is cognitively appropriate with normal insight, memory, and awareness. Cranial nerves 2-12 are intact. Sensory exam is normal. Reflexes are 2+ in all 4's. Fine motor coordination  is intact. No tremors. Motor function is grossly 5/5.  Musculoskeletal: right BK limb still swollen, tender.    Assessment/Plan: 1. Functional deficits which require 3+ hours per day of interdisciplinary therapy in a comprehensive inpatient rehab setting. Physiatrist is providing close team supervision and 24 hour management of active medical problems listed below. Physiatrist and rehab team continue to assess barriers to discharge/monitor patient progress toward functional and medical goals  Care Tool:  Bathing              Bathing assist       Upper Body Dressing/Undressing Upper body dressing        Upper body assist      Lower Body Dressing/Undressing Lower body dressing            Lower body assist       Toileting Toileting    Toileting assist       Transfers Chair/bed transfer  Transfers assist           Locomotion Ambulation   Ambulation assist              Walk 10 feet activity   Assist           Walk 50  feet activity   Assist           Walk 150 feet activity   Assist           Walk 10 feet on uneven surface  activity   Assist           Wheelchair     Assist               Wheelchair 50 feet with 2 turns activity    Assist            Wheelchair 150 feet activity     Assist          Blood pressure (!) 147/66, pulse 80, temperature 98.3 F (36.8 C), temperature source Oral, resp. rate 18, weight 69 kg, SpO2 99 %.  Medical Problem List and Plan: 1.  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  Antithrombotics: -DVT/anticoagulation: Subcutaneous heparin             -antiplatelet therapy: Aspirin 81 mg daily and Plavix 75 mg daily 3. Pain Management:  Neurontin 100 mg 3 times weekly, oxycodone as needed             -massage, edema control right residual limb 4. Mood: Zoloft 50 mg nightly             -antipsychotic agents: N/A 5. Neuropsych: This patient is capable of making decisions on her own behalf. 6. Skin/Wound Care: non-adherent dressing, 4x4, kerlix, ACE to stump  -I redressed wound today 7. Fluids/Electrolytes/Nutrition: Routine in and outs with follow-up chemistries 8. ESRD.  Continue hemodialysis as directed. HD scheduled after therapies to allow max participation during the day. 9.  Acute on chronic anemia.  Follow-up CBC.  Most recent hgb 7.6 -Continue Aranesp -transfuse in HD if needed 10.  HIV: BIKTARVY 11.  Hyperlipidemia.  Lipitor 12.  Diabetes mellitus.  5 units nightly.  Check blood sugars before meals and at bedtime             -adjust regimen as needed  -7/3 well controlled 13.  GERD.  Protonix 14.  Constipation. Small bm's only -sorbitol today, SSE if needed -schedule senna-s at bedtime    LOS: 1 days A FACE TO FACE EVALUATION WAS PERFORMED  Meredith Staggers 02/09/2021, 10:09 AM

## 2021-02-10 DIAGNOSIS — N186 End stage renal disease: Secondary | ICD-10-CM | POA: Diagnosis not present

## 2021-02-10 DIAGNOSIS — N2581 Secondary hyperparathyroidism of renal origin: Secondary | ICD-10-CM | POA: Diagnosis not present

## 2021-02-10 DIAGNOSIS — D631 Anemia in chronic kidney disease: Secondary | ICD-10-CM | POA: Diagnosis not present

## 2021-02-10 DIAGNOSIS — Z992 Dependence on renal dialysis: Secondary | ICD-10-CM | POA: Diagnosis not present

## 2021-02-10 DIAGNOSIS — I120 Hypertensive chronic kidney disease with stage 5 chronic kidney disease or end stage renal disease: Secondary | ICD-10-CM | POA: Diagnosis not present

## 2021-02-10 DIAGNOSIS — I96 Gangrene, not elsewhere classified: Secondary | ICD-10-CM | POA: Diagnosis not present

## 2021-02-10 DIAGNOSIS — Z89511 Acquired absence of right leg below knee: Secondary | ICD-10-CM | POA: Diagnosis not present

## 2021-02-10 LAB — GLUCOSE, CAPILLARY
Glucose-Capillary: 186 mg/dL — ABNORMAL HIGH (ref 70–99)
Glucose-Capillary: 141 mg/dL — ABNORMAL HIGH (ref 70–99)
Glucose-Capillary: 172 mg/dL — ABNORMAL HIGH (ref 70–99)
Glucose-Capillary: 142 mg/dL — ABNORMAL HIGH (ref 70–99)

## 2021-02-10 MED ORDER — DARBEPOETIN ALFA 100 MCG/0.5ML IJ SOSY
100.0000 ug | PREFILLED_SYRINGE | INTRAMUSCULAR | Status: DC
  Administered 2021-02-11: 100 ug via INTRAVENOUS
  Filled 2021-02-10: qty 0.5

## 2021-02-10 MED ORDER — OXYCODONE HCL 5 MG PO TABS
5.0000 mg | ORAL_TABLET | ORAL | Status: DC | PRN
  Administered 2021-02-10 – 2021-02-15 (×12): 5 mg via ORAL
  Filled 2021-02-10 (×12): qty 1

## 2021-02-10 MED ORDER — OXYMETAZOLINE HCL 0.05 % NA SOLN
1.0000 | Freq: Two times a day (BID) | NASAL | Status: AC
  Administered 2021-02-10 – 2021-02-12 (×5): 1 via NASAL
  Filled 2021-02-10: qty 30

## 2021-02-10 MED ORDER — AMLODIPINE BESYLATE 5 MG PO TABS
5.0000 mg | ORAL_TABLET | Freq: Every day | ORAL | Status: DC
  Administered 2021-02-10 – 2021-02-14 (×5): 5 mg via ORAL
  Filled 2021-02-10 (×6): qty 1

## 2021-02-10 MED ORDER — LIVING WELL WITH DIABETES BOOK
Freq: Once | Status: AC
  Filled 2021-02-10: qty 1

## 2021-02-10 NOTE — Progress Notes (Signed)
Occupational Therapy Session Note  Patient Details  Name: Kathy Frank MRN: [id number] Date of Birth: 1975-12-15  Today's Date: 02/10/2021 OT Individual Time: 0930-1030 OT Individual Time Calculation (min): 60 min    Short Term Goals: Week 1:  OT Short Term Goal 1 (Week 1): STGs=LTGs due to ELOS.  Skilled Therapeutic Interventions/Progress Updates:    Pt received sitting up in bed dealing with a nosebleed. Pt had to change tissues several times until bleeding stopped. During this time pt explained that she already washed up and changed clothing from the bed this morning with set up.   Discussed with her toileting skills and pt stated she did very well last night using an AP transfer with only slight A from nurse techs to get onto [facility].  Pt reports she was able to use lateral leans to manage clothing and cleansing. She knows that for now until she is able to ambulate on a R leg prosthetic in the future, both her bathroom and the one at her moms house are not wc accessible.  She stated she plans to sponge bathe and use the [facility]. Discussed ideas for easy clean up with BSC.  Pt then was able to scoot to EOB and place the slide board under her thighs with S. Scooted on board to wc with CGA.  Self propelled wc to day room to engage in 4th of July activities of horseshoes and painting.  Self propelled back to room to finish oral care.  Pt in room with all needs met.    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 Restrictions Weight Bearing Restrictions: Yes RLE Weight Bearing: Non weight bearing     Pain: Pt reports pain is "tolerable" to participate in therapy     Therapy/Group: Individual Therapy  [place] 02/10/2021, 12:20 PM

## 2021-02-10 NOTE — Progress Notes (Signed)
Patient ID: Kathy Frank, female   DOB: 05-28-1976, 45 y.o.   MRN: [id number] Met with the patient to review role of the nurse CM and initiate education. Reviewed risk factors including PVD, DM, HTN, HLD and low protein. Discussed dietary modifications and medications along with methods to increase protein levels while maintaining renal restrictions. Patient reports doing well with exception of small nose bleed. PA made aware and new order received for afrin nasal spray. Patient given handouts on care after discharge. Continue to follow along to discharge to address questions and educational needs. Collaborate with the SW to facilitate preparation for discharge. Margarito Liner, RN

## 2021-02-10 NOTE — Progress Notes (Signed)
Inpatient Rehabilitation Care Coordinator Assessment and Plan Patient Details  Name: Kathy Frank MRN: [id number] Date of Birth: [birth year]  Today's Date: 02/10/2021  Hospital Problems: Principal Problem:   Right below-knee amputee [facility])  Past Medical History:  Past Medical History:  Diagnosis Date   Anal abscess    chronic   Anxiety    CKD (chronic kidney disease) stage 5, GFR less than 15 ml/min ([place]) 06/19/2016   . Suspect etiologies to include diabetes and hypertension though cannot exclude HIV-associated nephropathy. Advised her against from using excessive Goody powder.   Constipation    trouble stoll coming out   Depression 06/28/2006   Qualifier: Diagnosis of  By: Riccardo Dubin MD, [place]     Diabetes mellitus type 2 in obese ([place]) 06/28/1994   DKA (diabetic ketoacidoses) 12/2014   Dyspnea    uses oxygeb 2 liters per minute at dialysis   End stage renal disease on dialysis [facility])    Erosive esophagitis    Esophageal reflux    ESRD on hemodialysis ([place]) 05/02/2019   Eye redness    Gastroparesis    ? diabetic   GERD (gastroesophageal reflux disease)    History of blood transfusion 2019   History of hypertension    off bp meds 2020 due to low bp with dialysis   Human immunodeficiency virus (HIV) disease ([place]) [id number]   Metabolic bone disease [id number]   Moderate nonproliferative diabetic retinopathy of both eyes ([place]) 11/21/2014   11/14/14: Noted on retinal imaging; needs follow-up imaging in 6 months  05/22/16: Noted on retinal imaging again; needs follow-up imaging in 6 months   Normocytic anemia    Pruritus, unspecified 05/20/2019   Renal failure (ARF), acute on chronic ([place]) 11/24/2018   tues, thurs sat [address] [facility] since sept 2020   Shortness of breath 07/01/2019   when lays doan at night, musy prop head up on 2 regular and 2 small pillows   Type 2 diabetes mellitus with diabetic peripheral angiopathy without gangrene ([place]) 05/01/2019   Type II diabetes mellitus  ([place]) 1994   diagnosed around 1994   Wears dentures    lower   Wound dehiscence    Wound infection s/p L transmetatarsal amputation    Past Surgical History:  Past Surgical History:  Procedure Laterality Date   ABDOMINAL AORTOGRAM W/LOWER EXTREMITY N/A 12/13/2020   Procedure: ABDOMINAL AORTOGRAM W/LOWER EXTREMITY;  Surgeon: Angelia Mould, MD;  Location: [place] CV LAB;  Service: Cardiovascular;  Laterality: N/A;   AMPUTATION Left 07/08/2018   Procedure: AMPUTATION FORTH RAY LEFT FOOT;  Surgeon: [name], MD;  Location: [place];  Service: Orthopedics;  Laterality: Left;   AMPUTATION Left 08/09/2018   Procedure: Left Transmetatarsal Amputation;  Surgeon: [name], MD;  Location: [place];  Service: Orthopedics;  Laterality: Left;   AMPUTATION Left 10/08/2018   Procedure: LEFT BELOW KNEE AMPUTATION;  Surgeon: [name], MD;  Location: [place];  Service: Orthopedics;  Laterality: Left;   AMPUTATION Left 10/28/2018   Procedure: REVISION BELOW KNEE AMPUTATION;  Surgeon: [name], MD;  Location: [place];  Service: Orthopedics;  Laterality: Left;   AMPUTATION Left 12/16/2018   Procedure: LEFT ABOVE KNEE AMPUTATION;  Surgeon: [name], MD;  Location: [place];  Service: Orthopedics;  Laterality: Left;   AMPUTATION Right 01/31/2021   Procedure: AMPUTATION BELOW KNEE;  Surgeon: Serafina Mitchell, MD;  Location: [facility];  Service: Vascular;  Laterality: Right;   AV FISTULA PLACEMENT Left  10/14/2018   Procedure: Arteriovenous (Av) Fistula Creation Left Arm;  Surgeon: [name], MD;  Location: [place];  Service: Vascular;  Laterality: Left;   [place] Left 04/14/2019   Procedure: BASILIC VEIN TRANSPOSITION SECOND STAGE LEFT ARM;  Surgeon: Rosetta Posner, MD;  Location: [place];  Service: Vascular;  Laterality: Left;   INCISION AND DRAINAGE ABSCESS N/A 10/24/2020   Procedure: exicision of hydradenitis;  Surgeon: Leighton Ruff, MD;  Location: [place];  Service: General;  Laterality: N/A;  45 min   INSERTION OF DIALYSIS CATHETER Right 04/14/2019   Procedure: INSERTION OF DIALYSIS CATHETER;  Surgeon: Rosetta Posner, MD;  Location: [place];  Service: Vascular;  Laterality: Right;   LOWER EXTREMITY ANGIOGRAPHY N/A 07/05/2018   Procedure: LOWER EXTREMITY ANGIOGRAPHY;  Surgeon: Serafina Mitchell, MD;  Location: [place] CV LAB;  Service: Cardiovascular;  Laterality: N/A;   PERIPHERAL VASCULAR ATHERECTOMY Right 12/13/2020   Procedure: PERIPHERAL VASCULAR ATHERECTOMY;  Surgeon: Angelia Mould, MD;  Location: [place] CV LAB;  Service: Cardiovascular;  Laterality: Right;  Superficial femoral   PERIPHERAL VASCULAR BALLOON ANGIOPLASTY Left 07/05/2018   Procedure: PERIPHERAL VASCULAR BALLOON ANGIOPLASTY;  Surgeon: Serafina Mitchell, MD;  Location: [place] CV LAB;  Service: Cardiovascular;  Laterality: Left;  SFA   PERIPHERAL VASCULAR BALLOON ANGIOPLASTY Right 12/13/2020   Procedure: PERIPHERAL VASCULAR BALLOON ANGIOPLASTY;  Surgeon: Angelia Mould, MD;  Location: [place] [facility] LAB;  Service: Cardiovascular;  Laterality: Right;  Peroneal artery, anterior tibial artery   RECTAL EXAM UNDER ANESTHESIA N/A 10/24/2020   Procedure: EXAM UNDER ANESTHESIA;  Surgeon: Leighton Ruff, MD;  [facility];  Service: General;  Laterality: N/A;   STUMP REVISION Left 10/19/2018   Procedure: REVISION LEFT BELOW KNEE AMPUTATION;  Surgeon: [name], MD;  Location: [place];  Service: Orthopedics;  Laterality: Left;   TUBAL LIGATION  2002   Social History:  reports that she quit smoking about 6 years ago. Her smoking use included cigarettes. She started smoking about 7 years ago. She has a 1.00 pack-year smoking history. She has never used smokeless tobacco. She reports previous alcohol use. She reports previous drug use. Drug: Marijuana.  Family / Support Systems Marital Status: Single Patient Roles: Parent, Other (Comment)  (daughter) Children: daughter Other Supports: Demetris-mom [phone]  Stacy-aunt [phone] Anticipated Caregiver: Mom Ability/Limitations of Caregiver: Can provide supervision level and be there Caregiver Availability: 24/7 Family Dynamics: Close knit with daughter whom she lives with and Mom who lives next door. They all help one another and pull together when times of need  Social History Preferred language: English Religion: Baptist Cultural Background: No issues Education: HS Read: Yes Write: Yes Employment Status: Disabled Public relations account executive Issues: No issues Guardian/Conservator: None-according to MD pt is capable of making her own decisions while here   Abuse/Neglect Abuse/Neglect Assessment Can Be Completed: Yes Physical Abuse: Denies Verbal Abuse: Denies Sexual Abuse: Denies Exploitation of patient/patient's resources: Denies Self-Neglect: Denies  Emotional Status Pt's affect, behavior and adjustment status: Pt is motivated to become independent again from a wheelchiar level. She sturggled with her leg and trying to keep it, but to no avail. She has accepted her loss and is ready to move forward. Recent Psychosocial Issues: other health issues manages them Psychiatric History: History of anxiety takes medications for which she finds it helpful Substance Abuse History: No issues  Patient / Family Perceptions, Expectations & Goals Pt/Family understanding of illness &  functional limitations: Pt is able to explain her amputation and talks with the MD daily and feels she is aware of her treatment plan going forward. She is motivated and feels is doing well already will no be here long Premorbid pt/family roles/activities: mom, daughter. niece, freind, etc Anticipated changes in roles/activities/participation: resume Pt/family expectations/goals: Pt states: " I plan on being independent when I leave here, almost am now."  [employer]:  Other (Comment) (ESRD pt) Premorbid Home Care/DME Agencies: Other (Comment) (wheelchair) Transportation available at discharge: Daughter provides  Discharge Planning Living Arrangements: Children Support Systems: Children, Parent, Other relatives, Friends/neighbors Type of Residence: Private residence Insurance Resources: [employer] (specify county), Multimedia programmer (specify) Primary school teacher) Financial Resources: SSD Financial Screen Referred: No Living Expenses: Own Money Management: Patient Does the patient have any problems obtaining your medications?: No Home Management: Daughter pt does some Patient/Family Preliminary Plans: Plans to go to Mom's home when discharged which is right next door to her home. Mom is there and can provide supervision if needed. Aware will be here short time-high level Care Coordinator Anticipated Follow Up Needs: HH/OP  Clinical Impression Pleasant very independent female who is already transferring herself to the bed. Aware has not been checked off to do on her own. She is going to Mom's home at discharge since she is there and it is next door to her home which she shares with her daughter. Work on equipment needs.  Elease Hashimoto 02/10/2021, 10:57 AM

## 2021-02-10 NOTE — IPOC Note (Signed)
Overall Plan of Care [facility]) Patient Details Name: Kathy Frank MRN: [id number] DOB: [birth year]  Admitting Diagnosis: Right below-knee amputee [facility])  Hospital Problems: Principal Problem:   Right below-knee amputee [facility])     Functional Problem List: Nursing Edema, Endurance, Medication Management, Motor, Nutrition, Pain, Perception, Safety, Sensory, Skin Integrity  PT Pain, Safety, Endurance, Motor, Balance  OT Balance, Edema, Endurance, Motor, Pain, Safety, Skin Integrity  SLP    TR         Basic ADL's: OT Bathing, Dressing, Toileting     Advanced  ADL's: OT Simple Meal Preparation, Light Housekeeping     Transfers: PT Bed Mobility, Bed to Chair, Car, Manufacturing systems engineer, Metallurgist: PT Wheelchair Mobility     Additional Impairments: OT    SLP        TR      Anticipated Outcomes Item Anticipated Outcome  Self Feeding NA  Swallowing      Basic self-care  mod I  Toileting  mod I-spvsn   Bathroom Transfers mod I-spsvsn  Bowel/Bladder  Remain continent of bowel and bladder.  Transfers  mod I  Locomotion  mod I w/c mobility.  Communication     Cognition     Pain  Pain at 2 or less.  Safety/Judgment  Miminal assist for transfer.   Therapy Plan: PT Intensity: Minimum of 1-2 x/day ,45 to 90 minutes PT Frequency: 5 out of 7 days PT Duration Estimated Length of Stay: 5-7 days OT Intensity: Minimum of 1-2 x/day, 45 to 90 minutes OT Frequency: 5 out of 7 days OT Duration/Estimated Length of Stay: 5-7 days     Due to the current state of emergency, patients may not be receiving their 44-hours of Medicare-mandated therapy.   Team Interventions: Nursing Interventions Patient/Family Education, Pain Management, Medication Management, Discharge Planning, Skin Care/Wound Management, Disease Management/Prevention  PT interventions Discharge planning, DME/adaptive equipment instruction, Functional mobility training, Therapeutic Activities,  UE/LE Strength taining/ROM, Training and development officer, Community reintegration, Barrister's clerk education, Therapeutic Exercise, UE/LE Coordination activities, Wheelchair propulsion/positioning  OT Interventions Training and development officer, Discharge planning, Pain management, Self Care/advanced ADL retraining, Therapeutic Activities, UE/LE Coordination activities, Disease mangement/prevention, Functional mobility training, Patient/family education, Skin care/wound managment, Therapeutic Exercise, Community reintegration, Engineer, drilling, Psychosocial support, UE/LE Strength taining/ROM, Wheelchair propulsion/positioning  SLP Interventions    TR Interventions    SW/CM Interventions Psychosocial Support, Patient/Family Education, Discharge Planning   Barriers to Discharge MD  Medical stability  Nursing Wound Care, Hemodialysis    PT Inaccessible home environment ramped entrance  OT Inaccessible home environment, Home environment access/layout, Decreased caregiver support, Weight bearing restrictions, Hemodialysis w/c does not fit in current home's bathroom; family intermittently available for physical assistance  SLP      SW       Team Discharge Planning: Destination: PT-Home ,OT- Home , SLP-  Projected Follow-up: PT-Home health PT, OT-  Home health OT, SLP-  Projected Equipment Needs: PT-To be determined, OT- To be determined, Tub/shower bench, Sliding board, SLP-  Equipment Details: PT-Pt has RW and w/c at home, tub chair but feels that it is too big for tub., OT-Bariatric drop arm BSC Patient/family involved in discharge planning: PT- Patient,  OT-Patient, SLP-   MD ELOS: 7d Medical Rehab Prognosis:  Good Assessment: 45 year old right-handed female with history of type 2 diabetes mellitus, end-stage renal disease with hemodialysis, anxiety, HIV, quit smoking 6 years ago, left AKA 2020, PVD with multiple revascularization procedures maintained on aspirin and Plavix.Marland Kitchen  Per chart review patient lives with her children.  1 level home 4 steps to entry.  Use a rolling walker and mobilization as well as wheelchair.  Presented 01/28/2021 with progressive right foot rest pain and nonhealing wounds.  Patient has undergone revascularization procedures to right lower extremity in the past.  Noted progressive ischemic changes limb was not felt to be salvageable underwent right BKA 01/31/2021 per Dr. Trula Slade.  [facility] course pain management.  Hemodialysis ongoing as per renal services.  Subcutaneous heparin for DVT prophylaxis.  Acute on chronic anemia latest hemoglobin 8.2.  Therapy evaluations completed due to patient decreased functional mobility was admitted for a comprehensive rehab program    See Team Conference Notes for weekly updates to the plan of care

## 2021-02-10 NOTE — Progress Notes (Signed)
Physical Therapy Session Note  Patient Details  Name: Kathy Frank MRN: [id number] Date of Birth: [birth year]  Today's Date: 02/10/2021 PT Individual Time:  -      Short Term Goals: Week 1:  PT Short Term Goal 1 (Week 1): LTG=STGs due to ELOS. Week 2:    Week 3:     Skilled Therapeutic Interventions/Progress Updates:   Pt initially seated on BSC w/nosebleed.  States she is lightheaded and asks therapist to return later.    Therapist returned 45 min later.  Pt lying in bed w/washcloth across forehead and states she cannot participate, nose continuing to bleed.   60 min missed time due to complaints/active nosebleed.  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 Restrictions Weight Bearing Restrictions: Yes RLE Weight Bearing: Non weight bearing    Therapy/Group: Individual Therapy Callie Fielding, [place] 02/10/2021, 8:54 AM

## 2021-02-10 NOTE — Progress Notes (Signed)
Occupational Therapy Session Note  Patient Details  Name: Kathy Frank MRN: [id number] Date of Birth: Jan 19, 1976  Today's Date: 02/10/2021 OT Individual Time: [id number] OT Individual Time Calculation (min): 10 min  and Today's Date: 02/10/2021 OT Missed Time: 32 Minutes Missed Time Reason: Patient ill (comment);Pain (5/10 headache, nose bleed, light headed; vitals assessed and nurse made aware)   Short Term Goals: Week 1:  OT Short Term Goal 1 (Week 1): STGs=LTGs due to ELOS.   Skilled Therapeutic Interventions/Progress Updates:    Pt supine in bed c/o 5/10 pain/ headache and light headedness; presenting with a mild active nose bleed from right nostril with tissue applied. Vitals assessed : BP 176/78 pulse 82. Nurse made aware and entered room for assessment.  Pt requesting OT to return at a later time due to not currently feeling good enough to participate.  OT provided pt with cold wash cloth application over forehead and eyes to reduce discomfort.    Therapist returned 2 hours later, with pt reporting pain still 5/10 and light headedness still present.  Pt requesting to defer OT session at this time.  Nurse made aware.  Call bell in reach, bed alarm on.    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 Restrictions Weight Bearing Restrictions: Yes RLE Weight Bearing: Non weight bearing     Therapy/Group: Individual Therapy  Ezekiel [place] 02/10/2021, 3:18 PM

## 2021-02-10 NOTE — Progress Notes (Signed)
Physical Therapy Session Note  Patient Details  Name: Kathy Frank MRN: [id number] Date of Birth: 06/14/1976  Today's Date: 02/10/2021 PT Individual Time: 1100-1155 PT Individual Time Calculation (min): 55 min   Short Term Goals: Week 1:  PT Short Term Goal 1 (Week 1): LTG=STGs due to ELOS.  Skilled Therapeutic Interventions/Progress Updates:    Pt received long sitting in bed to start session. She's agreeable to PT session but reports that she's been having active nose bleeds in both nares. Notified RN who arrived to provide nasal spray and this was administered. Educated pt on positioning for her R BKA while in bed - specifically having no pillows under her R knee and she voiced understanding. She was able to scoot along the EOB with supervision and she was able to place her sliding board transfer with only min cues with good ability to laterally weight shift and lift up her L hip to place board. She completed sliding board transfer towards her L side with CGA and good understanding of safety and technique. She wheeled herself in w/c with BUE's and supervision on level surfaces from her room to ortho rehab gym, >293f. Provided her with w/c gloves to provide skin protection and improve grip to allow increased propulsion and efficiency. She practiced going up/down a 117finclined ramp x2 bouts, requiring minA for managing threshold (pt with anti-tippers on w/c). She propelled herself to ADL apartment room in w/c with supervision and worked on furniture transfers from w/c<>sofa couch - she required minA for completing this due to soft/compliant surface from couch. Education provided on understanding her COG/BOS due to prior L AKA and new R BKA. Wheeled herself to main rehab gym with supervision in w/c. Educated her on w/c parts/management such as operating leg rests and arm rests - completed blocked practice of removing/applying these to improve carryover and learning - progressed from required mod cues  to min cues. Slide board transfer with CGA from w/c to mat table. Worked on BUE exercises to improve strength for functional transfers and w/c mobility. Completed: -1x15 shldr press 4# dowel rod -1x15 overhead tricep extension with 4# dowel rod -1x20 chest press with 4# dowel rod while hitting ball back to therapist to challenge balance/COG -2x5 seated arm push ups from yoga blocks with 3 second holds (able to fully lift and clear buttocks)  Completed sliding board transfer with CGA back to her w/c and she propelled herself back to her room with supervision, ~[id number] additional sliding board transfer, this time requiring minA due to fatigue, from w/c to EOB. She was able to complete bed mobility mod I and remained supine with all needs in reach.   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 Restrictions Weight Bearing Restrictions: Yes RLE Weight Bearing: Non weight bearing General:    Therapy/Group: Individual Therapy  Kathy Frank 02/10/2021, 11:19 AM

## 2021-02-10 NOTE — Progress Notes (Addendum)
[facility] Progress Note   Subjective:   Patient states she feels relatively well today.  Having some pain in her leg after therapy but otherwise feels well  Objective Vitals:   02/09/21 1330 02/09/21 1959 02/09/21 2200 02/10/21 0444  BP: (!) 161/71 (!) 190/81 (!) 170/80 (!) 180/84  Pulse: 80 81 80 77  Resp: '17 16  18  '$ Temp: 99 F (37.2 C) 98.4 F (36.9 C)  98.4 F (36.9 C)  TempSrc:  Oral  Oral  SpO2: 99% 100%  99%  Weight:    69.1 kg   Physical Exam General:chronically ill appearing female in NAD Heart: Normal rate, no audible murmur Lungs: Bilateral chest rise with no increased work of breathing Abdomen:soft, NTND Extremities:L AKA - no edema, R BKA dressed  Dialysis Access: LU AVF +b/t   Filed Weights   02/08/21 2122 02/09/21 0500 02/10/21 0444  Weight: 69.5 kg 69 kg 69.1 kg   No intake or output data in the 24 hours ending 02/10/21 0932   Additional Objective Labs: Basic Metabolic Panel: Recent Labs  Lab 02/06/21 0145 02/07/21 0619 02/08/21 0608  NA 128* 132* 130*  K 5.3* 4.3 4.7  CL 84* 93* 91*  CO2 '28 27 24  '$ GLUCOSE 219* 146* 115*  BUN 47* 25* 40*  CREATININE 8.18* 5.36* 7.36*  CALCIUM 10.4* 9.1 9.9  PHOS 6.0* 3.4 4.5   Liver Function Tests: Recent Labs  Lab 02/06/21 0145 02/07/21 0619 02/08/21 0608  ALBUMIN 2.5* 2.6* 2.5*    CBC: Recent Labs  Lab 02/04/21 0112 02/05/21 0322 02/06/21 0145 02/08/21 1439  WBC 9.6 8.3 7.7 9.4  HGB 8.6* 8.1* 8.2* 7.6*  HCT 27.0* 26.1* 25.6* 24.2*  MCV 94.4 95.3 93.1 94.2  PLT 350 359 421* 458*   Blood Culture    Component Value Date/Time   SDES BLOOD RIGHT ANTECUBITAL 01/28/2021 1812   SPECREQUEST  01/28/2021 1812    BOTTLES DRAWN AEROBIC AND ANAEROBIC Blood Culture adequate volume   CULT  01/28/2021 1812    NO GROWTH 5 DAYS Performed at [facility], [address] [place],  [address]    REPTSTATUS 02/02/2021 FINAL 01/28/2021 1812    CBG: Recent Labs  Lab  02/09/21 0610 02/09/21 1211 02/09/21 1628 02/09/21 2054 02/10/21 0559  GLUCAP 166* 250* 73 136* 186*    Medications:  [START ON 02/11/2021] iron sucrose      amLODipine  5 mg Oral Daily   aspirin EC  81 mg Oral Daily   atorvastatin  40 mg Oral Daily   bictegravir-emtricitabine-tenofovir AF  1 tablet Oral Daily   bisacodyl  5 mg Oral Daily   calcitRIOL  1 mcg Oral Daily   calcium acetate  2,001 mg Oral TID WC   clopidogrel  75 mg Oral Daily   [START ON 02/15/2021] darbepoetin (ARANESP) injection - DIALYSIS  100 mcg Intravenous Q Sat-HD   feeding supplement (NEPRO CARB STEADY)  237 mL Oral BID BM   gabapentin  100 mg Oral Once per day on Tue Thu Sat   heparin  5,000 Units Subcutaneous Q8H   insulin aspart  0-15 Units Subcutaneous TID WC   insulin glargine  5 Units Subcutaneous QHS   living well with diabetes book   Does not apply Once   multivitamin  1 tablet Oral QHS   oxymetazoline  1 spray Each Nare BID   pantoprazole  40 mg Oral Daily   polyethylene glycol  17 g Oral BID  senna  2 tablet Oral BID   sertraline  50 mg Oral QHS    Dialysis Orders: [facility]-TTS 4h   75 kg    400/1.5    2/ 2.0 bath  AVF Hep 3000 -Calcitriol 1.25 mcg p.o. q. 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 ESRD: HD TTS - Next HD 02/11/21.  Hold heparin due to oozing from BKA and drop in Hgb. Hypertension/volume: BP improved this AM.  Continue home meds.  Under EDW, expected post BKA, lower dry on d/c ~69-70kg.   Anemia of ESRD: last drop 8.2>7.6 Hold heparin. Continue Aranesp 134mg qSat.  Venofer '100mg'$  qHD x 4 ordered.  Transfuse as needed.   Metabolic bone disease: Phoslo initially increased 3AC due to high phos, Calcium elevated so reduced back to 2AC.  Phos and CCa in goal recently may need to change to non calcium based binder if continues to have  ^[employer] Nutrition: Albumin low related to acute chronic illness.  Encourage protein HIV-meds per primary Diabetes mellitus type 2-per primary Depressive disorder on sertraline per primary Disposition: Now in CIR.  [facility] 02/10/2021,9:32 AM  LOS: 2 days

## 2021-02-10 NOTE — Progress Notes (Signed)
Pt c/o 5/10 HA, light headedness and continued nose bleed to r nostril. Afrin given this am. Will continue to monitor

## 2021-02-10 NOTE — Progress Notes (Signed)
Inpatient [facility] Individual Statement of Services  Patient Name:  Kathy Frank  Date:  02/10/2021  Welcome to the [facility].  Our goal is to provide you with an individualized program based on your diagnosis and situation, designed to meet your specific needs.  With this comprehensive rehabilitation program, you will be expected to participate in at least 3 hours of rehabilitation therapies Monday-Friday, with modified therapy programming on the weekends.  Your rehabilitation program will include the following services:  Physical Therapy (PT), Occupational Therapy (OT), 24 hour per day rehabilitation nursing, Care Coordinator, Rehabilitation Medicine, Nutrition Services, and Pharmacy Services  Weekly team conferences will be held on Tuesday to discuss your progress.  Your Inpatient Rehabilitation Care Coordinator will talk with you frequently to get your input and to update you on team discussions.  Team conferences with you and your family in attendance may also be held.  Expected length of stay: 5-7 days  Overall anticipated outcome: independent with device  Depending on your progress and recovery, your program may change. Your Inpatient Rehabilitation Care Coordinator will coordinate services and will keep you informed of any changes. Your Inpatient Rehabilitation Care Coordinator's name and contact numbers are listed  below.  The following services may also be recommended but are not provided by the [facility]:   [facility] [facility] will be made to provide these services after discharge if needed.  Arrangements include referral to agencies that provide these services.  Your insurance has been verified to be:  [employer]-Medicare & medicaid Your primary doctor is:  [name]  Pertinent information will be shared with your doctor and your insurance  company.  Inpatient Rehabilitation Care Coordinator:  Ovidio Kin, [place] or Emilia Beck  Information discussed with and copy given to patient by: Elease Hashimoto, 02/10/2021, 10:46 AM

## 2021-02-10 NOTE — Progress Notes (Signed)
PROGRESS NOTE   Subjective/Complaints:  Pt feels pain med do not work long enough currently q6h , Has Right stump pain  ROS: Patient denies  CP, SOB, N/V/D   Objective:   No results found. Recent Labs    02/08/21 1439  WBC 9.4  HGB 7.6*  HCT 24.2*  PLT 458*    Recent Labs    02/08/21 0608  NA 130*  K 4.7  CL 91*  CO2 24  GLUCOSE 115*  BUN 40*  CREATININE 7.36*  CALCIUM 9.9    No intake or output data in the 24 hours ending 02/10/21 1226       Physical Exam: Vital Signs Blood pressure (!) 180/84, pulse 77, temperature 98.4 F (36.9 C), temperature source Oral, resp. rate 18, weight 69.1 kg, SpO2 99 %.  General: No acute distress Mood and affect are appropriate Heart: Regular rate and rhythm no rubs murmurs or extra sounds Lungs: Clear to auscultation, breathing unlabored, no rales or wheezes Abdomen: Positive bowel sounds, soft nontender to palpation, nondistended Extremities: No clubbing, cyanosis, or edema Skin: No evidence of breakdown, no evidence of rash   Neuro: Pt is cognitively appropriate with normal insight, memory, and awareness. Cranial nerves 2-12 are intact. Sensory exam is normal. Reflexes are 2+ in all 4's. Fine motor coordination is intact. No tremors. Motor function is grossly 5/5.  Musculoskeletal: right BK limb still swollen, tender.    Assessment/Plan: 1. Functional deficits which require 3+ hours per day of interdisciplinary therapy in a comprehensive inpatient rehab setting. Physiatrist is providing close team supervision and 24 hour management of active medical problems listed below. Physiatrist and rehab team continue to assess barriers to discharge/monitor patient progress toward functional and medical goals  Care Tool:  Bathing    Body parts bathed by patient: Right arm, Left arm, Chest, Abdomen, Front perineal area, Right upper leg, Left upper leg, Face     Body  parts n/a: Right lower leg, Left lower leg   Bathing assist Assist Level: Supervision/Verbal cueing     Upper Body Dressing/Undressing Upper body dressing   What is the patient wearing?: Pull over shirt    Upper body assist Assist Level: Set up assist    Lower Body Dressing/Undressing Lower body dressing      What is the patient wearing?: Pants     Lower body assist Assist for lower body dressing: Set up assist (bed level)     Toileting Toileting    Toileting assist Assist for toileting: Contact Guard/Touching assist     Transfers Chair/bed transfer  Transfers assist     Chair/bed transfer assist level: Contact Guard/Touching assist     Locomotion Ambulation   Ambulation assist   Ambulation activity did not occur: N/A          Walk 10 feet activity   Assist  Walk 10 feet activity did not occur: N/A        Walk 50 feet activity   Assist Walk 50 feet with 2 turns activity did not occur: N/A         Walk 150 feet activity   Assist Walk 150 feet activity did not occur: N/A  Walk 10 feet on uneven surface  activity   Assist Walk 10 feet on uneven surfaces activity did not occur: N/A         Wheelchair     Assist Will patient use wheelchair at discharge?: Yes Type of Wheelchair: Manual    Wheelchair assist level: Supervision/Verbal cueing Max wheelchair distance: 70    Wheelchair 50 feet with 2 turns activity    Assist        Assist Level: Supervision/Verbal cueing   Wheelchair 150 feet activity     Assist  Wheelchair 150 feet activity did not occur: Refused       Blood pressure (!) 180/84, pulse 77, temperature 98.4 F (36.9 C), temperature source Oral, resp. rate 18, weight 69.1 kg, SpO2 99 %.  Medical Problem List and Plan: 1.  Debility secondary to right BKA 01/31/2021 with history of left AKA             -patient may  shower if right BKA incision is covered             -ELOS/Goals: 7  days, mod I at w/c level for PT and OT  -Patient is beginning CIR therapies today including PT and OT  2.  Antithrombotics: -DVT/anticoagulation: Subcutaneous heparin             -antiplatelet therapy: Aspirin 81 mg daily and Plavix 75 mg daily 3. Pain Management: Neurontin 100 mg 3 times weekly, oxycodone as needed             -massage, edema control right residual limb 4. Mood: Zoloft 50 mg nightly             -antipsychotic agents: N/A 5. Neuropsych: This patient is capable of making decisions on her own behalf. 6. Skin/Wound Care: non-adherent dressing, 4x4, kerlix, ACE to stump  -I redressed wound today 7. Fluids/Electrolytes/Nutrition: Routine in and outs with follow-up chemistries 8. ESRD.  Continue hemodialysis as directed. HD scheduled after therapies to allow max participation during the day. 9.  Acute on chronic anemia.  Follow-up CBC.  Most recent hgb 7.6 -Continue Aranesp -transfuse in HD if needed 10.  HIV: BIKTARVY 11.  Hyperlipidemia.  Lipitor 12.  Diabetes mellitus.  5 units nightly.  Check blood sugars before meals and at bedtime             -adjust regimen as needed  CBG (last 3)  Recent Labs    02/09/21 2054 02/10/21 0559 02/10/21 1203  GLUCAP 136* 186* 141*  Controlled   13.  GERD.  Protonix 14.  Constipation. Small bm's only -sorbitol today, SSE if needed -schedule senna-s at bedtime   15.  HTN- As per nephrology Vitals:   02/09/21 2200 02/10/21 0444  BP: (!) 170/80 (!) 180/84  Pulse: 80 77  Resp:  18  Temp:  98.4 F (36.9 C)  SpO2:  99%    LOS: 2 days A FACE TO FACE EVALUATION WAS PERFORMED  Charlett Blake 02/10/2021, 12:26 PM

## 2021-02-11 DIAGNOSIS — D62 Acute posthemorrhagic anemia: Secondary | ICD-10-CM | POA: Diagnosis not present

## 2021-02-11 DIAGNOSIS — I96 Gangrene, not elsewhere classified: Secondary | ICD-10-CM | POA: Diagnosis not present

## 2021-02-11 DIAGNOSIS — N2581 Secondary hyperparathyroidism of renal origin: Secondary | ICD-10-CM | POA: Diagnosis not present

## 2021-02-11 DIAGNOSIS — Z89511 Acquired absence of right leg below knee: Secondary | ICD-10-CM | POA: Diagnosis not present

## 2021-02-11 DIAGNOSIS — I120 Hypertensive chronic kidney disease with stage 5 chronic kidney disease or end stage renal disease: Secondary | ICD-10-CM | POA: Diagnosis not present

## 2021-02-11 DIAGNOSIS — I739 Peripheral vascular disease, unspecified: Secondary | ICD-10-CM | POA: Diagnosis not present

## 2021-02-11 DIAGNOSIS — N186 End stage renal disease: Secondary | ICD-10-CM | POA: Diagnosis not present

## 2021-02-11 DIAGNOSIS — Z992 Dependence on renal dialysis: Secondary | ICD-10-CM | POA: Diagnosis not present

## 2021-02-11 DIAGNOSIS — D631 Anemia in chronic kidney disease: Secondary | ICD-10-CM | POA: Diagnosis not present

## 2021-02-11 LAB — CBC WITH DIFFERENTIAL/PLATELET
Abs Immature Granulocytes: 0.05 10*3/uL (ref 0.00–0.07)
Basophils Absolute: 0.1 10*3/uL (ref 0.0–0.1)
Basophils Relative: 1 %
Eosinophils Absolute: 0.5 10*3/uL (ref 0.0–0.5)
Eosinophils Relative: 5 %
HCT: 26.6 % — ABNORMAL LOW (ref 36.0–46.0)
Hemoglobin: 8.7 g/dL — ABNORMAL LOW (ref 12.0–15.0)
Immature Granulocytes: 1 %
Lymphocytes Relative: 13 %
Lymphs Abs: 1.3 10*3/uL (ref 0.7–4.0)
MCH: 30 pg (ref 26.0–34.0)
MCHC: 32.7 g/dL (ref 30.0–36.0)
MCV: 91.7 fL (ref 80.0–100.0)
Monocytes Absolute: 0.8 10*3/uL (ref 0.1–1.0)
Monocytes Relative: 8 %
Neutro Abs: 7.7 10*3/uL (ref 1.7–7.7)
Neutrophils Relative %: 72 %
Platelets: 574 10*3/uL — ABNORMAL HIGH (ref 150–400)
RBC: 2.9 MIL/uL — ABNORMAL LOW (ref 3.87–5.11)
RDW: 14.6 % (ref 11.5–15.5)
WBC: 10.4 10*3/uL (ref 4.0–10.5)
nRBC: 0 % (ref 0.0–0.2)

## 2021-02-11 LAB — RENAL FUNCTION PANEL
Albumin: 2.4 g/dL — ABNORMAL LOW (ref 3.5–5.0)
Anion gap: 13 (ref 5–15)
BUN: 33 mg/dL — ABNORMAL HIGH (ref 6–20)
CO2: 27 mmol/L (ref 22–32)
Calcium: 10.8 mg/dL — ABNORMAL HIGH (ref 8.9–10.3)
Chloride: 88 mmol/L — ABNORMAL LOW (ref 98–111)
Creatinine, Ser: 9.47 mg/dL — ABNORMAL HIGH (ref 0.44–1.00)
GFR, Estimated: 5 mL/min — ABNORMAL LOW (ref >60)
Glucose, Bld: 112 mg/dL — ABNORMAL HIGH (ref 70–99)
Phosphorus: 3.5 mg/dL (ref 2.5–4.6)
Potassium: 4.2 mmol/L (ref 3.5–5.1)
Sodium: 128 mmol/L — ABNORMAL LOW (ref 135–145)

## 2021-02-11 LAB — GLUCOSE, CAPILLARY
Glucose-Capillary: 148 mg/dL — ABNORMAL HIGH (ref 70–99)
Glucose-Capillary: 161 mg/dL — ABNORMAL HIGH (ref 70–99)
Glucose-Capillary: 130 mg/dL — ABNORMAL HIGH (ref 70–99)
Glucose-Capillary: 205 mg/dL — ABNORMAL HIGH (ref 70–99)

## 2021-02-11 MED ORDER — DARBEPOETIN ALFA 100 MCG/0.5ML IJ SOSY
PREFILLED_SYRINGE | INTRAMUSCULAR | Status: AC
  Filled 2021-02-11: qty 0.5

## 2021-02-11 MED ORDER — OXYCODONE HCL 5 MG PO TABS
ORAL_TABLET | ORAL | Status: AC
  Administered 2021-02-11: 5 mg via ORAL
  Filled 2021-02-11: qty 1

## 2021-02-11 MED ORDER — ONDANSETRON HCL 4 MG/2ML IJ SOLN
4.0000 mg | Freq: Three times a day (TID) | INTRAMUSCULAR | Status: DC | PRN
  Filled 2021-02-11: qty 2

## 2021-02-11 MED ORDER — ONDANSETRON HCL 4 MG/2ML IJ SOLN
4.0000 mg | Freq: Four times a day (QID) | INTRAMUSCULAR | Status: DC | PRN
  Administered 2021-02-11: 4 mg via INTRAMUSCULAR

## 2021-02-11 MED ORDER — SORBITOL 70 % SOLN
60.0000 mL | Status: AC
  Administered 2021-02-11: 60 mL via ORAL
  Filled 2021-02-11: qty 60

## 2021-02-11 MED ORDER — ONDANSETRON HCL 4 MG PO TABS
4.0000 mg | ORAL_TABLET | Freq: Three times a day (TID) | ORAL | Status: DC | PRN
  Filled 2021-02-11: qty 1

## 2021-02-11 NOTE — Progress Notes (Signed)
Physical Therapy Session Note  Patient Details  Name: Kathy Frank MRN: [id number] Date of Birth: 1976-03-15  Today's Date: 02/11/2021 PT Individual Time: [id number] PT Individual Time Calculation (min): 45 min   Short Term Goals: Week 1:  PT Short Term Goal 1 (Week 1): LTG=STGs due to ELOS.  Skilled Therapeutic Interventions/Progress Updates:     Patient in bed upon PT arrival. Patient alert reporting nausea since last night, patient agreeable to PT session focused on amputee education, wound care, desensitization, positioning and bed mobility, bed level due to nausea. Patient reported 6/10 R residual limb and phantom pain during session, RN made aware. PT provided repositioning, rest breaks, and distraction as pain interventions throughout session.   Removed ACE wrap from R residual limb, noted substantial drainage on dressing on medial side. Removed dressing, no active drainage noted, incision healing well with 3-4 spots of darker coloring around incision. Patient reports that she has not yet looked at her residual limb, but reports she would be willing to today. Provided hand held mirror and patient inspected her limb, provided cues technique for thorough inspection and signs/symptoms of infection to look out for. Also, educate on performing daily inspections to note changes during healing or new pressure sores, patient stated understanding.   Patient reports "itching" on her R foot. Patient tolerated PT touching and tapping on her residual limb for desensitization, progressed to patient performing touching, did not want to touch the bottom of her limb today, but agreeable to progress over the next few days for improved sensory input and desensitization.   PT applied a non-adherant dressing and Curlex to her residual limb. Demonstrated and educated on wrapping technique for residual limb using 1-4" ACE wrap focused on figure 8 wrapping, increased compression distal>proximal, smoothing out  wrinkles for skin integrity, and full coverage to prevent fluid trapping. Educated on benefits of wrapping above the knee to prevent fluid trapping at the joint to maintain joint integrity. Patient attentive and appreciative of education.   LPN provided bowl meds during session and provided patient with crackers and ginger ale to help with nausea with medication. Patient ate 1 cracker and took several sips of ginger ale with some worsening of nausea. LPN viewed wound and aware of dressing change during med admin.   Attempted bed level exercises, performed quad set x10 and SLR x10 with 5 sec hold each. Patient reporting increased nausea and requesting to rest at this time. Patient missed 15 min of skilled PT due to nause, RN made aware. Will attempt to make-up missed time as able.  Educated on positioning of residual limb to promote elevation for edema control and R knee extension to prevent contractures with use of 2 foam pieces. Patient with appropriate questions and recall of previous education for positioning.   Patient in bed at end of session with breaks locked, bed alarm set, and all needs within reach.   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 Restrictions Weight Bearing Restrictions: Yes RLE Weight Bearing: Non weight bearing General: PT Amount of Missed Time (min): 15 Minutes PT Missed Treatment Reason: Patient ill (Comment) (nausea)    Therapy/Group: Individual Therapy  Taralee Marcus L Rolan Wrightsman PT, DPT  02/11/2021, 11:22 AM

## 2021-02-11 NOTE — Progress Notes (Signed)
Patient ID: Kathy Frank, female   DOB: [birth year], 45 y.o.   MRN: [id number]  Met with pt to discuss team conference goals of mod/I transfers wheelchair level and discharge date 7/9. Informed her drop-arm bedside commode would not be covered due to received a bedside commode in 2020 and can only get every 5 years. Hospital bed and transfer board to be delivered to mom's home where she is going upon discharge. Will continue to follow to assist with discharge needs.

## 2021-02-11 NOTE — Progress Notes (Signed)
PROGRESS NOTE   Subjective/Complaints: Developed nausea and vomiting starting last night. Seems worse this morning.   ROS: Patient denies fever, rash, sore throat, blurred vision, diarrhea, cough, shortness of breath or chest pain, joint or back pain, headache, or mood change.    Objective:   No results found. Recent Labs    02/08/21 1439  WBC 9.4  HGB 7.6*  HCT 24.2*  PLT 458*   No results for input(s): NA, K, CL, CO2, GLUCOSE, BUN, CREATININE, CALCIUM in the last 72 hours.  Intake/Output Summary (Last 24 hours) at 02/11/2021 1327 Last data filed at 02/11/2021 0708 Gross per 24 hour  Intake 358 ml  Output --  Net 358 ml        Physical Exam: Vital Signs Blood pressure (!) 178/93, pulse 77, temperature 98.3 F (36.8 C), temperature source Oral, resp. rate 18, weight 71.3 kg, SpO2 100 %.  Constitutional: eyes closed, uncomfortable . Vital signs reviewed. HEENT: EOMI, oral membranes moist Neck: supple Cardiovascular: RRR without murmur. No JVD    Respiratory/Chest: CTA Bilaterally without wheezes or rales. Normal effort    GI/Abdomen: BS +, non-tender, non-distended Ext: no clubbing, cyanosis, or edema Psych: flat Skin: right stump dressed Neuro: Pt is cognitively appropriate with normal insight, memory, and awareness. Cranial nerves 2-12 are intact. Sensory exam is normal. Reflexes are 2+ in all 4's. Fine motor coordination is intact. No tremors. Motor function is grossly 5/5.  Musculoskeletal: right BK limb remains swollen, tender.    Assessment/Plan: 1. Functional deficits which require 3+ hours per day of interdisciplinary therapy in a comprehensive inpatient rehab setting. Physiatrist is providing close team supervision and 24 hour management of active medical problems listed below. Physiatrist and rehab team continue to assess barriers to discharge/monitor patient progress toward functional and medical  goals  Care Tool:  Bathing    Body parts bathed by patient: Right arm, Left arm, Chest, Abdomen, Front perineal area, Right upper leg, Left upper leg, Face, Buttocks     Body parts n/a: Right lower leg, Left lower leg   Bathing assist Assist Level: Set up assist     Upper Body Dressing/Undressing Upper body dressing   What is the patient wearing?: Pull over shirt    Upper body assist Assist Level: Set up assist    Lower Body Dressing/Undressing Lower body dressing      What is the patient wearing?: Pants, Underwear/pull up     Lower body assist Assist for lower body dressing: Set up assist     Toileting Toileting    Toileting assist Assist for toileting: Contact Guard/Touching assist     Transfers Chair/bed transfer  Transfers assist     Chair/bed transfer assist level: Contact Guard/Touching assist     Locomotion Ambulation   Ambulation assist   Ambulation activity did not occur: N/A          Walk 10 feet activity   Assist  Walk 10 feet activity did not occur: N/A        Walk 50 feet activity   Assist Walk 50 feet with 2 turns activity did not occur: N/A         Walk  150 feet activity   Assist Walk 150 feet activity did not occur: N/A         Walk 10 feet on uneven surface  activity   Assist Walk 10 feet on uneven surfaces activity did not occur: N/A         Wheelchair     Assist Will patient use wheelchair at discharge?: Yes Type of Wheelchair: Manual    Wheelchair assist level: Supervision/Verbal cueing Max wheelchair distance: 70    Wheelchair 50 feet with 2 turns activity    Assist        Assist Level: Supervision/Verbal cueing   Wheelchair 150 feet activity     Assist  Wheelchair 150 feet activity did not occur: Refused       Blood pressure (!) 178/93, pulse 77, temperature 98.3 F (36.8 C), temperature source Oral, resp. rate 18, weight 71.3 kg, SpO2 100 %.  Medical Problem List  and Plan: 1.  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  Antithrombotics: -DVT/anticoagulation: Subcutaneous heparin             -antiplatelet therapy: Aspirin 81 mg daily and Plavix 75 mg daily 3. Pain Management: Neurontin 100 mg 3 times weekly, oxycodone as needed             -massage, edema control right residual limb 4. Mood: Zoloft 50 mg nightly             -antipsychotic agents: N/A 5. Neuropsych: This patient is capable of making decisions on her own behalf. 6. Skin/Wound Care: non-adherent dressing, 4x4, kerlix, ACE to stump  -continue local dressing 7. Fluids/Electrolytes/Nutrition: Routine in and outs with follow-up chemistries 8. ESRD.  Continue hemodialysis as directed. HD scheduled after therapies to allow max participation during the day. 9.  Acute on chronic anemia.  Follow-up CBC.  Most recent hgb 7.6 -Continue Aranesp -transfuse in HD if needed 10.  HIV: BIKTARVY 11.  Hyperlipidemia.  Lipitor 12.  Diabetes mellitus.  5 units nightly.  Check blood sugars before meals and at bedtime             -adjust regimen as needed  CBG (last 3)  Recent Labs    02/10/21 2115 02/11/21 0616 02/11/21 1138  GLUCAP 142* 148* 161*  Controlled 7/5  13.  GERD.  Protonix 14.  Constipation/nausea. had two more bm's today -scheduled senna bid -prn zofran for nausea   15.  HTN- As per nephrology Vitals:   02/11/21 0510 02/11/21 0511  BP: (!) 178/93   Pulse: 76 77  Resp: 18   Temp: 98.3 F (36.8 C)   SpO2: 100% 100%     LOS: 3 days A FACE TO FACE EVALUATION WAS PERFORMED  Meredith Staggers 02/11/2021, 1:27 PM

## 2021-02-11 NOTE — Progress Notes (Addendum)
Occupational Therapy Session Note  Patient Details  Name: Kathy Frank MRN: [id number] Date of Birth: September 30, 1975  Today's Date: 02/11/2021 OT Individual Time: 9381-0175 OT Individual Time Calculation (min): 28 min    Short Term Goals: Week 1:  OT Short Term Goal 1 (Week 1): STGs=LTGs due to ELOS.  Skilled Therapeutic Interventions/Progress Updates:    Pt received supine, reporting pain in abdomen, n/v throughout night and AM, but agreeable to bed level ADLs. UB/LB bathing & dressing performed bed level set up A, which is what pt plans to do at home. Pt requested to use CHG wipes after washing up with soap/water. Pt rolled R & L mod I using bed rails to don pants over hips. Oral hygiene completed bed level set up A. Following brushing teeth, pt with emesis, RN informed. Pt provided comfort measures and left seated in bed with all immediate needs met, bed alarm active, waiting on meds from RN. Pt missed 32 minutes of skilled OT due to n/v, will follow up as appropriate to make up missed time.   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 Restrictions Weight Bearing Restrictions: Yes RLE Weight Bearing: Non weight bearing General: General OT Amount of Missed Time: 32 Minutes  Pain: Pain Assessment Pain Scale: 0-10 Pain Score:  (unrated) Pain Type: Acute pain Pain Location: Abdomen Pain Orientation: Medial Pain Descriptors / Indicators: Discomfort Pain Onset: On-going Pain Intervention(s): RN made aware;Distraction;Rest;Emotional support    Therapy/Group: Individual Therapy  Mellissa Kohut 02/11/2021, 7:31 AM

## 2021-02-11 NOTE — Patient Care Conference (Signed)
Inpatient RehabilitationTeam Conference and Plan of Care Update Date: 02/11/2021   Time: 12:01 PM    Patient Name: Kathy Frank      Medical Record Number: [id number]  Date of Birth: [birth year] Sex: Female         Room/Bed: 4W18C/4W18C-01 Payor Info: Payor: [employer] MEDICARE / Plan: [facility] MEDICARE / Product Type: *No Product type* /    Admit Date/Time:  02/08/2021  7:30 PM  Primary Diagnosis:  Right below-knee amputee [facility])  Hospital Problems: Principal Problem:   Right below-knee amputee [facility])    Expected Discharge Date: Expected Discharge Date: 02/15/21  Team Members Present: Physician leading conference: Dr. Alger Simons Care Coodinator Present: Dorien Chihuahua, RN, BSN, CRRN;Becky Dupree, LCSW Nurse Present: Dorien Chihuahua, RN PT Present: Magda Kiel, PT OT Present: Other (comment) Hulda Marin, OT) PPS Coordinator present : Gunnar Fusi, SLP     Current Status/Progress Goal Weekly Team Focus  Bowel/Bladder   Patient is chronic ESRD dialysis T/TH/Saturday, continent of bladder/bowel, LBM 02/10/21,continue schedule Myalax, stool softener and prn's  Maintain regular toileting needs, continue Dialysis schedule x 3 day weekly  Continue to address constipation and irregularies of B/B   Swallow/Nutrition/ Hydration             ADL's   set up UB adls, spvsn-min A depending on position LB adls, S bed mobility, CGA/min A transfers  mod I  ADLs, functional mobility, residual limb care, DME/AE, safety for home   Mobility   CGA/min A transfers, S w/c mobility level surfaces >200' min A on inclined surface, S bed mobility  I w/c level except S car transfers  transfers, activity tolerance, UE/LE strength, pre-prosthetic education   Communication             Safety/Cognition/ Behavioral Observations            Pain   surgical pain Right BKA and headache.rate pain 7-8/10, medicated with prn  maintain and demonstrate comfort tolerate of pain goal < =3  Continue to assess pain  anf medicate per orders and protocol, re-assess   Skin   Right BKA intact , no drainage, prn dress changes  Remain free of infection  Assess surgical wound site QS/PRN notify medical team of new onset of infection or changes asap     Discharge Planning:  Going to Mom's home at discharge due to she can provide supervision level. Daughter transports to HD. Very motivated high level-short LOS   Team Discussion: Ischemia with right BKA; old Left AKA, ESRD/HD was doing well until today except for constipation and slight nose bleed Friday. Today with N/V.  Patient on target to meet rehab goals: yes, currently set up for upper body ADLs and supervision for lower body. CGA for transfers and supervision wheelchair up to 200'. Independent with wheelchair and supervision for car transfers, MOD I ADL goals for discharge.  *See Care Plan and progress notes for long and short-term goals.   Revisions to Treatment Plan:  Work on periprosthetic education   Teaching Needs: Incision care  Current Barriers to Discharge: Decreased caregiver support and Hemodialysis  Possible Resolutions to Barriers: Hospital bed DME recommendations for out of pocket purchase Family education Discharge after dialysis    Medical Summary Current Status: right BKA, hx of left AKA. wound healing so far. pain controlled. developed nausea overnight. may be d/t constipation  Barriers to Discharge: Medical stability   Possible Resolutions to Barriers/Weekly Focus: augment bowel program. pain control. stump care, prosthetic education  Continued Need for Acute Rehabilitation Level of Care: The patient requires daily medical management by a physician with specialized training in physical medicine and rehabilitation for the following reasons: Direction of a multidisciplinary physical rehabilitation program to maximize functional independence : Yes Medical management of patient stability for increased activity during  participation in an intensive rehabilitation regime.: Yes Analysis of laboratory values and/or radiology reports with any subsequent need for medication adjustment and/or medical intervention. : Yes   I attest that I was present, lead the team conference, and concur with the assessment and plan of the team.   Dorien Chihuahua B 02/11/2021, 2:17 PM

## 2021-02-11 NOTE — Progress Notes (Signed)
[facility] Progress Note   Subjective:   Patient having nausea and abdominal pain today.  Feels like she is constipated.  No other complaints  Objective Vitals:   02/10/21 1811 02/10/21 1933 02/11/21 0510 02/11/21 0511  BP:  (!) 189/89 (!) 178/93   Pulse:  71 76 77  Resp: '18 16 18   '$ Temp:  98.2 F (36.8 C) 98.3 F (36.8 C)   TempSrc:  Oral Oral   SpO2:  100% 100% 100%  Weight:    71.3 kg   Physical Exam General:chronically ill appearing female in NAD Heart: Normal rate, no audible murmur Lungs: Bilateral chest rise with no increased work of breathing Abdomen:soft, NTND Extremities:L AKA - no edema, R BKA dressed  Dialysis Access: LU AVF +b/t   Filed Weights   02/09/21 0500 02/10/21 0444 02/11/21 0511  Weight: 69 kg 69.1 kg 71.3 kg    Intake/Output Summary (Last 24 hours) at 02/11/2021 0930 Last data filed at 02/11/2021 0708 Gross per 24 hour  Intake 358 ml  Output --  Net 358 ml     Additional Objective Labs: Basic Metabolic Panel: Recent Labs  Lab 02/06/21 0145 02/07/21 0619 02/08/21 0608  NA 128* 132* 130*  K 5.3* 4.3 4.7  CL 84* 93* 91*  CO2 '28 27 24  '$ GLUCOSE 219* 146* 115*  BUN 47* 25* 40*  CREATININE 8.18* 5.36* 7.36*  CALCIUM 10.4* 9.1 9.9  PHOS 6.0* 3.4 4.5   Liver Function Tests: Recent Labs  Lab 02/06/21 0145 02/07/21 0619 02/08/21 0608  ALBUMIN 2.5* 2.6* 2.5*    CBC: Recent Labs  Lab 02/05/21 0322 02/06/21 0145 02/08/21 1439  WBC 8.3 7.7 9.4  HGB 8.1* 8.2* 7.6*  HCT 26.1* 25.6* 24.2*  MCV 95.3 93.1 94.2  PLT 359 421* 458*   Blood Culture    Component Value Date/Time   SDES BLOOD RIGHT ANTECUBITAL 01/28/2021 1812   SPECREQUEST  01/28/2021 1812    BOTTLES DRAWN AEROBIC AND ANAEROBIC Blood Culture adequate volume   CULT  01/28/2021 1812    NO GROWTH 5 DAYS Performed at [facility] [address], [place], [place] [address]    REPTSTATUS 02/02/2021 FINAL 01/28/2021 1812    CBG: Recent Labs  Lab  02/10/21 0559 02/10/21 1203 02/10/21 1708 02/10/21 2115 02/11/21 0616  GLUCAP 186* 141* 172* 142* 148*    Medications:  iron sucrose      amLODipine  5 mg Oral Daily   aspirin EC  81 mg Oral Daily   atorvastatin  40 mg Oral Daily   bictegravir-emtricitabine-tenofovir AF  1 tablet Oral Daily   bisacodyl  5 mg Oral Daily   calcitRIOL  1 mcg Oral Daily   calcium acetate  2,001 mg Oral TID WC   clopidogrel  75 mg Oral Daily   darbepoetin (ARANESP) injection - DIALYSIS  100 mcg Intravenous Q Tue-HD   feeding supplement (NEPRO CARB STEADY)  237 mL Oral BID BM   gabapentin  100 mg Oral Once per day on Tue Thu Sat   heparin  5,000 Units Subcutaneous Q8H   insulin aspart  0-15 Units Subcutaneous TID WC   insulin glargine  5 Units Subcutaneous QHS   multivitamin  1 tablet Oral QHS   oxymetazoline  1 spray Each Nare BID   pantoprazole  40 mg Oral Daily   polyethylene glycol  17 g Oral BID   senna  2 tablet Oral BID   sertraline  50 mg Oral QHS  sorbitol  60 mL Oral NOW    Dialysis Orders: [facility]-TTS 4h   75 kg    400/1.5    2/ 2.0 bath  AVF Hep 3000 -Calcitriol 1.25 mcg p.o. q. 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 ESRD: HD TTS - Next HD 02/11/21.  Hold heparin due to oozing from BKA and drop in Hgb. Hypertension/volume: Under EDW, expected post BKA, lower dry on d/c ~69-70kg.  Continue to challenge dry weight as able.  Added back amlodipine 5 mg daily on 7/4 Anemia of ESRD: last drop 8.2>7.6 Hold heparin. Continue Aranesp 170mg qSat.  Venofer '100mg'$  qHD x 4 given.  Transfuse as needed.   Metabolic bone disease: Phoslo initially increased 3AC due to high phos, Calcium elevated so reduced back to 2AC.  Phos and CCa in goal recently may need to change to non calcium based binder if continues to have ^[employer] Nutrition:  Albumin low related to acute chronic illness.  Encourage protein HIV-meds per primary Diabetes mellitus type 2-per primary Depressive disorder on sertraline per primary Disposition: [facility] CIR. [facility] 02/11/2021,9:30 AM  LOS: 3 days

## 2021-02-11 NOTE — Progress Notes (Signed)
Hemodialysis- Patient had to use bedpan while on HD. While turning to clean patient bent arm to grab siderail. Venous needle pierced through access. Bleeding was stopped quickly and all blood was returned via arterial needle. Attempted to recannulate patient but she refused. Completed 2.5 hours of treatment. UF 1L. Dr. Joylene Grapes notified of issue. All vitals remained stable throughout treatment. Unable to give IV iron due to incident, will get rescheduled.

## 2021-02-11 NOTE — Progress Notes (Signed)
Pt declined 34m of Sorbitol

## 2021-02-11 NOTE — Progress Notes (Signed)
Physical Therapy Session Note  Patient Details  Name: Kathy Frank MRN: [id number] Date of Birth: 03-06-1976  Today's Date: 02/11/2021 PT Individual Time: 0920-0950 PT Individual Time Calculation (min): 30 min   Short Term Goals: Week 1:  PT Short Term Goal 1 (Week 1): LTG=STGs due to ELOS.  Skilled Therapeutic Interventions/Progress Updates:    Patient in supine in new room and reports had N&V this morning.  Sitting up in long sitting/ring sitting in bed.  States did get MD to get her a shot since she has no IV.  Reports just moved to new room.  Time spent assisting her to organize some of her things in her new room and to make her comfortable in bed.  Agreed to UE exercises.  Used green t-band for seated rows, Diagonal horizontal abduction bilateral, tricep extensions, bicep curls (with blue t-band) and bilateral shoulder ER all x 15 reps.  Patient fatigued and returned to supine; still feeling ill so missed 30 min of skilled PT.   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 Restrictions Weight Bearing Restrictions: Yes RLE Weight Bearing: Non weight bearing General: PT Amount of Missed Time (min): 30 Minutes PT Missed Treatment Reason: Patient fatigue;Patient ill (Comment)  Pain: Pain Assessment Pain Scale: Faces Pain Score:  (unrated) Faces Pain Scale: Hurts little more Pain Type: Acute pain Pain Location: Abdomen Pain Orientation: Medial Pain Descriptors / Indicators: Grimacing;Discomfort Pain Onset: On-going Pain Intervention(s): Distraction;Rest    Therapy/Group: Individual Therapy  Reginia Naas Magda Kiel, PT 02/11/2021, 9:56 AM

## 2021-02-12 ENCOUNTER — Ambulatory Visit: Payer: Medicare Other | Admitting: Podiatry

## 2021-02-12 DIAGNOSIS — Z992 Dependence on renal dialysis: Secondary | ICD-10-CM | POA: Diagnosis not present

## 2021-02-12 DIAGNOSIS — I96 Gangrene, not elsewhere classified: Secondary | ICD-10-CM | POA: Diagnosis not present

## 2021-02-12 DIAGNOSIS — N2581 Secondary hyperparathyroidism of renal origin: Secondary | ICD-10-CM | POA: Diagnosis not present

## 2021-02-12 DIAGNOSIS — Z89511 Acquired absence of right leg below knee: Secondary | ICD-10-CM | POA: Diagnosis not present

## 2021-02-12 DIAGNOSIS — I120 Hypertensive chronic kidney disease with stage 5 chronic kidney disease or end stage renal disease: Secondary | ICD-10-CM | POA: Diagnosis not present

## 2021-02-12 DIAGNOSIS — I739 Peripheral vascular disease, unspecified: Secondary | ICD-10-CM | POA: Diagnosis not present

## 2021-02-12 DIAGNOSIS — D631 Anemia in chronic kidney disease: Secondary | ICD-10-CM | POA: Diagnosis not present

## 2021-02-12 DIAGNOSIS — N186 End stage renal disease: Secondary | ICD-10-CM | POA: Diagnosis not present

## 2021-02-12 DIAGNOSIS — D62 Acute posthemorrhagic anemia: Secondary | ICD-10-CM | POA: Diagnosis not present

## 2021-02-12 LAB — GLUCOSE, CAPILLARY
Glucose-Capillary: 169 mg/dL — ABNORMAL HIGH (ref 70–99)
Glucose-Capillary: 165 mg/dL — ABNORMAL HIGH (ref 70–99)
Glucose-Capillary: 219 mg/dL — ABNORMAL HIGH (ref 70–99)
Glucose-Capillary: 67 mg/dL — ABNORMAL LOW (ref 70–99)
Glucose-Capillary: 96 mg/dL (ref 70–99)

## 2021-02-12 NOTE — Evaluation (Signed)
Recreational Therapy Assessment and Plan  Patient Details  Name: Kathy Frank MRN: [id number] Date of Birth: 03/05/1976 Today's Date: 02/12/2021 LATE ENTRY for 02/11/2021 Rehab Potential:  Good ELOS:   d/c 7/9  Assessment  Hospital Problem: Principal Problem:   Right below-knee amputee [facility])     Past Medical History:      Past Medical History:  Diagnosis Date   Anal abscess      chronic   Anxiety     CKD (chronic kidney disease) stage 5, GFR less than 15 ml/min ([place]) 06/19/2016    . Suspect etiologies to include diabetes and hypertension though cannot exclude HIV-associated nephropathy. Advised her against from using excessive Goody powder.   Constipation      trouble stoll coming out   Depression 06/28/2006    Qualifier: Diagnosis of  By: Riccardo Dubin MD, Todd     Diabetes mellitus type 2 in obese ([place]) 06/28/1994   DKA (diabetic ketoacidoses) 12/2014   Dyspnea      uses oxygeb 2 liters per minute at dialysis   End stage renal disease on dialysis [facility])     Erosive esophagitis     Esophageal reflux     ESRD on hemodialysis ([place]) 05/02/2019   Eye redness     Gastroparesis      ? diabetic   GERD (gastroesophageal reflux disease)     History of blood transfusion 2019   History of hypertension      off bp meds 2020 due to low bp with dialysis   Human immunodeficiency virus (HIV) disease ([place]) 04/26/9149   Metabolic bone disease 5/69/7948   Moderate nonproliferative diabetic retinopathy of both eyes ([place]) 11/21/2014    11/14/14: Noted on retinal imaging; needs follow-up imaging in 6 months  05/22/16: Noted on retinal imaging again; needs follow-up imaging in 6 months   Normocytic anemia     Pruritus, unspecified 05/20/2019   Renal failure (ARF), acute on chronic ([place]) 11/24/2018    tues, thurs sat [address] hs since sept 2020   Shortness of breath 07/01/2019    when lays doan at night, musy prop head up on 2 regular and 2 small pillows   Type 2 diabetes mellitus with diabetic  peripheral angiopathy without gangrene ([place]) 05/01/2019   Type II diabetes mellitus ([place]) 1994    diagnosed around 1994   Wears dentures      lower   Wound dehiscence     Wound infection s/p L transmetatarsal amputation      Past Surgical History:       Past Surgical History:  Procedure Laterality Date   ABDOMINAL AORTOGRAM W/LOWER EXTREMITY N/A 12/13/2020    Procedure: ABDOMINAL AORTOGRAM W/LOWER EXTREMITY;  Surgeon: Angelia Mould, MD;  Location: [place] [facility];  Service: Cardiovascular;  Laterality: N/A;   AMPUTATION Left 07/08/2018    Procedure: AMPUTATION FORTH RAY LEFT FOOT;  Surgeon: [name], MD;  Location: [place];  Service: Orthopedics;  Laterality: Left;   AMPUTATION Left 08/09/2018    Procedure: Left Transmetatarsal Amputation;  Surgeon: [name], MD;  Location: [place];  Service: Orthopedics;  Laterality: Left;   AMPUTATION Left 10/08/2018    Procedure: LEFT BELOW KNEE AMPUTATION;  Surgeon: [name], MD;  Location: [place];  Service: Orthopedics;  Laterality: Left;   AMPUTATION Left 10/28/2018    Procedure: REVISION BELOW KNEE AMPUTATION;  Surgeon: [name], MD;  Location: [place];  Service: Orthopedics;  Laterality: Left;   AMPUTATION  Left 12/16/2018    Procedure: LEFT ABOVE KNEE AMPUTATION;  Surgeon: [name], MD;  Location: [place];  Service: Orthopedics;  Laterality: Left;   AMPUTATION Right 01/31/2021    Procedure: AMPUTATION BELOW KNEE;  Surgeon: Serafina Mitchell, MD;  Location: [place];  Service: Vascular;  Laterality: Right;   AV FISTULA PLACEMENT Left 10/14/2018    Procedure: Arteriovenous (Av) Fistula Creation Left Arm;  Surgeon: [name], MD;  Location: [place];  Service: Vascular;  Laterality: Left;   [place] Left 04/14/2019    Procedure: BASILIC VEIN TRANSPOSITION SECOND STAGE LEFT ARM;  Surgeon: Rosetta Posner, MD;  Location: [place];  Service: Vascular;  Laterality: Left;   INCISION AND DRAINAGE ABSCESS N/A  10/24/2020    Procedure: exicision of hydradenitis;  Surgeon: Leighton Ruff, MD;  Location: [place];  Service: General;  Laterality: N/A;  45 min   INSERTION OF DIALYSIS CATHETER Right 04/14/2019    Procedure: INSERTION OF DIALYSIS CATHETER;  Surgeon: Rosetta Posner, MD;  Location: [place];  Service: Vascular;  Laterality: Right;   LOWER EXTREMITY ANGIOGRAPHY N/A 07/05/2018    Procedure: LOWER EXTREMITY ANGIOGRAPHY;  Surgeon: Serafina Mitchell, MD;  Location: [place] [facility];  Service: Cardiovascular;  Laterality: N/A;   PERIPHERAL VASCULAR ATHERECTOMY Right 12/13/2020    Procedure: PERIPHERAL VASCULAR ATHERECTOMY;  Surgeon: Angelia Mould, MD;  Location: [place] [facility];  Service: Cardiovascular;  Laterality: Right;  Superficial femoral   PERIPHERAL VASCULAR BALLOON ANGIOPLASTY Left 07/05/2018    Procedure: PERIPHERAL VASCULAR BALLOON ANGIOPLASTY;  Surgeon: Serafina Mitchell, MD;  Location: [place] [facility];  Service: Cardiovascular;  Laterality: Left;  SFA   PERIPHERAL VASCULAR BALLOON ANGIOPLASTY Right 12/13/2020    Procedure: PERIPHERAL VASCULAR BALLOON ANGIOPLASTY;  Surgeon: Angelia Mould, MD;  Location: [place] [facility];  Service: Cardiovascular;  Laterality: Right;  Peroneal artery, anterior tibial artery   RECTAL EXAM UNDER ANESTHESIA N/A 10/24/2020    Procedure: EXAM UNDER ANESTHESIA;  Surgeon: Leighton Ruff, MD;  Location: [facility];  Service: General;  Laterality: N/A;   STUMP REVISION Left 10/19/2018    Procedure: REVISION LEFT BELOW KNEE AMPUTATION;  Surgeon: [name], MD;  Location: [place];  Service: Orthopedics;  Laterality: Left;   TUBAL LIGATION   2002      Assessment & Plan Clinical Impression: Kathy Frank is a 45 year old right-handed female with history of type 2 diabetes mellitus, end-stage renal disease with hemodialysis, anxiety, HIV, quit smoking 6 years ago, left AKA 2020, PVD with multiple revascularization  procedures maintained on aspirin and Plavix..  Per chart review patient lives with her children.  1 level home 4 steps to entry.  Use a rolling walker and mobilization as well as wheelchair.  Presented 01/28/2021 with progressive right foot rest pain and nonhealing wounds.  Patient has undergone revascularization procedures to right lower extremity in the past.  Noted progressive ischemic changes limb was not felt to be salvageable underwent right BKA 01/31/2021 per Dr. Trula Slade.  [facility] course pain management.  Hemodialysis ongoing as per renal services.  Subcutaneous heparin for DVT prophylaxis.  Acute on chronic anemia latest hemoglobin 8.2. Patient transferred to CIR on 02/08/2021 .     Met with pt to discuss TR services, provide education on activity analysis/modifications, community reintegration, coping.  Pt presents with decreased activity tolerance, decreased functional mobility, decreased balance, feelings of anxiety Limiting pt's independence with leisure/community pursuits.  Recommendations for other services: None   Discharge Criteria: Patient will be discharged from TR if patient refuses treatment 3 consecutive times without medical reason.  If treatment goals not met, if there is a change in medical status, if patient makes no progress towards goals or if patient is discharged from hospital.  The above assessment, treatment plan, treatment alternatives and goals were discussed and mutually agreed upon: by patient  [place] 02/12/2021, 3:17 PM

## 2021-02-12 NOTE — Progress Notes (Signed)
Occupational Therapy Session Note  Patient Details  Name: Kathy Frank MRN: [id number] Date of Birth: [birth year]  Today's Date: 02/12/2021 OT Individual Time: 1595-3967 OT Individual Time Calculation (min): 53 min    Short Term Goals: Week 1:  OT Short Term Goal 1 (Week 1): STGs=LTGs due to ELOS.  Skilled Therapeutic Interventions/Progress Updates:    Pt received supine, agreeable to OT, reporting slight pain in L arm at HD site due to needle incident during HD yesterday; did not interfere with session. Pt requested session focus on strengthening/conditioning activities in gym. Bed mobility mod I with bed rails. Education and trials throughout session on pt positioning w/c independently for transfers, managing R residual limb rest, and ensuring brakes locked before transfers. Donned limb protector min A on R residual limb. Posterior transfer bed>w/c close spvsn with no vc's. Pt self-propelled w/c ~400' with no vc's or A to increase independence for w/c management at home and in community. Slide board transfer w/c>therapy mat CGA due to balance and higher height of mat than w/c; min A to ensure safe placement of slide board and w/c positioning for transfer. Pt engaged in variety of strengthening/conditioning exercises to improve endurance and independence in functional mobility, ADLs, and IADLs. Engaged in dynamic sitting balance task with volley ball for core strengthening and balance reactions - catching/throwing in all directions outside BOS as well as faster tapping with hands overhead. Engaged in 4LB dowel rod chest press, overhead press, bilateral rows, and overhead lift with elbows extended - 2 sets of 10x. Modified Russian twists with slight trunk/hip EXT with 2kg weighted ball 2 sets 10x. Modified sit ups with min A to keep RLE from coming off mat - 2 sets 10x with some overhead ball tosses for momentum. Yoga block seated push ups 2 sets of 10x holding for 3-5 counts with good clearance of  buttocks off mat. Lateral scooting with anterior weight shifts across therapy mat 2x with min vc's for technique. Min vc's and intermittent rest breaks due to fatigue required throughout activities. Lateral transfer mat>w/c without slide board close spvsn. Anterior transfer w/c>bed close spvsn. Pt remained seated in bed, alarm set, all immediate needs met.  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 Restrictions Weight Bearing Restrictions: Yes RLE Weight Bearing: Non weight bearing Pain: Pain Assessment Pain Scale: 0-10 Pain Score:  (unrated) Faces Pain Scale: No hurt Pain Type: Acute pain Pain Location: Arm Pain Orientation: Left Pain Descriptors / Indicators: Sore Pain Onset: With Activity Patients Stated Pain Goal: 1 Pain Intervention(s): Repositioned;Distraction;Rest    Therapy/Group: Individual Therapy  Mellissa Kohut 02/12/2021, 7:53 AM

## 2021-02-12 NOTE — Progress Notes (Signed)
Nurse present with PT performing dressing change. Moderate drainage on gauze observed. Staples intact. No s/s of infection observed

## 2021-02-12 NOTE — Progress Notes (Signed)
Physical Therapy Session Note  Patient Details  Name: Kathy Frank MRN: [id number] Date of Birth: 1976/04/09  Today's Date: 02/12/2021 PT Individual Time: Session1: [id number]; Session2: 1600-1630 PT Individual Time Calculation (min): 45 min & 30 min  Short Term Goals: Week 1:  PT Short Term Goal 1 (Week 1): LTG=STGs due to ELOS.  Skilled Therapeutic Interventions/Progress Updates:    Session1:  Patient asleep and breakfast untouched.  Easily aroused and reports rough day with vomiting, laxative and had liquid stool in dialysis and infiltrated her IV while laying on her side for cleaning.  Allowed to eat seated up in bed in long sitting. Pt missed 15 min skilled PT to allow her to eat.  Noted R LE Ace wrap not covering part of dressing and pt reports drainage through the bandage.  Obtained new dressing and replaced wrap on R residual limb placed non-adherent gauze, Kerlix and 2 ace wraps.  Patient refused to place limb guard reporting too swollen to fit.  RN in to deliver pain medication and obtained scrubs for pt.  Cut to make shorts and pt donned with S in bed.  Patient washed face seated EOB and performed slide board transfer to w/c with S after w/c set up.  Reports leg rest did not come with her after transfer from other room yesterday.  Patient propelled w/c x 250' with S.  Patient performed ramp negotiation in w/c with CGA for initial threshold, then close S.  Patient performed car transfer with slide board and close S to [place] for safety.  Patient able to manage to remove legrest on R with cues for car transfer.  Patient assisted in w/c to room for time management.  Transfer back to bed with slide board and close S, pt leaning back due to felt like falling forward.  Discussed issue with sliding off board if leans posterior so despite fear of falling forward easier to catch balance that way.  Left in bed with call bell in reach, R LE elevated on pillows and bed alarm active.  [place]:  Patient in  supine with nursing in the room.  Reports enjoyed group session earlier.  Patient supine for therex including hip adductor squeezes w/ 5 sec hold, quad sets on R 5 sec hold, sidelying hip abduction on each side assist for positioning, B bridging over bolster w/ 5 sec hold, prone hip extension bilateral and R knee flexion all x 10.  Supine to sit with S and seated EOB for reaching/balance activities reaching forward to retrieve then place cones on laundry basket.  Seated reaching for and tossing bean bags, then performing lateral scoots on EOB with S.  Patient left in supine with call bell in reach, R LE on pillows and bed alarm active.  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 Restrictions Weight Bearing Restrictions: Yes RLE Weight Bearing: Non weight bearing General: PT Amount of Missed Time (min): 15 Minutes PT Missed Treatment Reason: Other (Comment)   Therapy/Group: Individual Therapy  Reginia Naas Magda Kiel, PT 02/12/2021, 9:46 AM

## 2021-02-12 NOTE — Significant Event (Signed)
Hypoglycemic Event  CBG: 67  Treatment: 4 oz juice/soda  Symptoms: None  Follow-up CBG: Time:2127 CBG Result:96  Possible Reasons for Event: Other: Pt previously covered with 5 units of insulin and pt did not eat dinner.    Comments/MD notified:    Celesta Aver

## 2021-02-12 NOTE — Progress Notes (Signed)
PROGRESS NOTE   Subjective/Complaints: Sleepy this am. Tells me that nausea is better. Had several bm's yesterday. PM HD yesterday  ROS: Limited due to cognitive/behavioral    Objective:   No results found. Recent Labs    02/11/21 1151  WBC 10.4  HGB 8.7*  HCT 26.6*  PLT 574*   Recent Labs    02/11/21 1151  NA 128*  K 4.2  CL 88*  CO2 27  GLUCOSE 112*  BUN 33*  CREATININE 9.47*  CALCIUM 10.8*    Intake/Output Summary (Last 24 hours) at 02/12/2021 0834 Last data filed at 02/11/2021 1622 Gross per 24 hour  Intake --  Output 969 ml  Net -969 ml        Physical Exam: Vital Signs Blood pressure (!) 184/84, pulse 82, temperature 98.4 F (36.9 C), temperature source Oral, resp. rate 16, weight 70.3 kg, SpO2 100 %.  Constitutional: No distress . Vital signs reviewed. HEENT: EOMI, oral membranes moist Neck: supple Cardiovascular: RRR without murmur. No JVD    Respiratory/Chest: CTA Bilaterally without wheezes or rales. Normal effort    GI/Abdomen: BS +, non-tender, non-distended Ext: no clubbing, cyanosis, or edema Psych: sleepy but cooperative.  Skin: right stump with mild s/s drainage. Neuro: Pt is cognitively appropriate with normal insight, memory, and awareness. Cranial nerves 2-12 are intact. Sensory exam is normal. Reflexes are 2+ in all 4's. Fine motor coordination is intact. No tremors. Motor function is grossly 5/5.  Musculoskeletal: right BK limb remains swollen and tender.    Assessment/Plan: 1. Functional deficits which require 3+ hours per day of interdisciplinary therapy in a comprehensive inpatient rehab setting. Physiatrist is providing close team supervision and 24 hour management of active medical problems listed below. Physiatrist and rehab team continue to assess barriers to discharge/monitor patient progress toward functional and medical goals  Care Tool:  Bathing    Body parts  bathed by patient: Right arm, Left arm, Chest, Abdomen, Front perineal area, Right upper leg, Left upper leg, Face, Buttocks     Body parts n/a: Right lower leg, Left lower leg   Bathing assist Assist Level: Set up assist     Upper Body Dressing/Undressing Upper body dressing   What is the patient wearing?: Pull over shirt    Upper body assist Assist Level: Set up assist    Lower Body Dressing/Undressing Lower body dressing      What is the patient wearing?: Pants, Underwear/pull up     Lower body assist Assist for lower body dressing: Set up assist     Toileting Toileting    Toileting assist Assist for toileting: Contact Guard/Touching assist     Transfers Chair/bed transfer  Transfers assist     Chair/bed transfer assist level: Contact Guard/Touching assist     Locomotion Ambulation   Ambulation assist   Ambulation activity did not occur: N/A          Walk 10 feet activity   Assist  Walk 10 feet activity did not occur: N/A        Walk 50 feet activity   Assist Walk 50 feet with 2 turns activity did not occur: N/A  Walk 150 feet activity   Assist Walk 150 feet activity did not occur: N/A         Walk 10 feet on uneven surface  activity   Assist Walk 10 feet on uneven surfaces activity did not occur: N/A         Wheelchair     Assist Will patient use wheelchair at discharge?: Yes Type of Wheelchair: Manual    Wheelchair assist level: Supervision/Verbal cueing Max wheelchair distance: 70    Wheelchair 50 feet with 2 turns activity    Assist        Assist Level: Supervision/Verbal cueing   Wheelchair 150 feet activity     Assist  Wheelchair 150 feet activity did not occur: Refused       Blood pressure (!) 184/84, pulse 82, temperature 98.4 F (36.9 C), temperature source Oral, resp. rate 16, weight 70.3 kg, SpO2 100 %.  Medical Problem List and Plan: 1.  Debility secondary to right BKA  01/31/2021 with history of left AKA             -patient may  shower if right BKA incision is covered             -ELOS/Goals: 02/15/21,  mod I at w/c level for PT and OT  -Continue CIR therapies including PT, OT . Hopefully an easier day today  2.  Antithrombotics: -DVT/anticoagulation: Subcutaneous heparin             -antiplatelet therapy: Aspirin 81 mg daily and Plavix 75 mg daily 3. Pain Management: Neurontin 100 mg 3 times weekly, oxycodone as needed             -massage, edema control right residual limb has been discussed 4. Mood: Zoloft 50 mg nightly             -antipsychotic agents: N/A 5. Neuropsych: This patient is capable of making decisions on her own behalf. 6. Skin/Wound Care: non-adherent dressing, 4x4, kerlix, ACE to stump  -continue local dressing 7. Fluids/Electrolytes/Nutrition: Routine in and outs with follow-up chemistries 8. ESRD.  Continue hemodialysis as directed. HD scheduled after therapies to allow max participation during the day. 9.  Acute on chronic anemia.  Follow-up CBC.  Most recent hgb 8.7 -Continue Aranesp -transfuse in HD if needed 10.  HIV: BIKTARVY 11.  Hyperlipidemia.  Lipitor 12.  Diabetes mellitus.  5 units nightly.  Check blood sugars before meals and at bedtime             -adjust regimen as needed  CBG (last 3)  Recent Labs    02/11/21 1724 02/11/21 2106 02/12/21 0615  GLUCAP 130* 205* 169*  Controlled until yesterday--cover with SSI  13.  GERD.  Protonix 14.  Constipation/nausea. had two more bm's today -scheduled senna bid -prn zofran for nausea   15.  HTN- As per nephrology Vitals:   02/11/21 2027 02/12/21 0506  BP: (!) 147/63 (!) 184/84  Pulse: 88 82  Resp: 16 16  Temp: 98.6 F (37 C) 98.4 F (36.9 C)  SpO2: 100% 100%     LOS: 4 days A FACE TO FACE EVALUATION WAS PERFORMED  Meredith Staggers 02/12/2021, 8:34 AM

## 2021-02-12 NOTE — Progress Notes (Signed)
Occupational Therapy Session Note  Patient Details  Name: SELINDA BENTLE MRN: [id number] Date of Birth: 1975-11-21  Today's Date: 02/12/2021 OT Group Time: 1330-1430 OT Group Time Calculation (min): 60 min   Short Term Goals: Week 1:  OT Short Term Goal 1 (Week 1): STGs=LTGs due to ELOS.  Skilled Therapeutic Interventions/Progress Updates:  Pt was seen for skilled group session with focus of group session on social engagement, dynamic reaching from w/c level, BUE strength, w/c mobility and education/ practicing with managing w/c parts i.e legs rests, arm rests . Pt able to adjust legs rests and arm rests with overall supervision and intermittent verbal cues for sequencing and managing w/c parts. Pt reports pain 6/10 in R residual limb, utilized rest breaks and repositioning as pain mgmt strategy. Pt transported back to room via RT.    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 Restrictions Weight Bearing Restrictions: Yes RLE Weight Bearing: Non weight bearing    Therapy/Group: Group Therapy  Precious Haws 02/12/2021, 3:42 PM

## 2021-02-12 NOTE — Progress Notes (Addendum)
[facility] Progress Note   Subjective:   Patient tired but no more nausea or constipation.  Abbreviated HD session yesterday due to infiltration.  Objective Vitals:   02/11/21 1622 02/11/21 2027 02/12/21 0500 02/12/21 0506  BP: 136/74 (!) 147/63  (!) 184/84  Pulse:  88  82  Resp: '14 16  16  '$ Temp: 98.2 F (36.8 C) 98.6 F (37 C)  98.4 F (36.9 C)  TempSrc: Oral Oral  Oral  SpO2: 100% 100%  100%  Weight: 70.4 kg  70.3 kg    Physical Exam General:chronically ill appearing female in NAD Heart: Normal rate, no audible murmur Lungs: Bilateral chest rise with no increased work of breathing Abdomen:soft, NTND Extremities:L AKA - no edema, R BKA dressed  Dialysis Access: LU AVF +b/t   Filed Weights   02/11/21 0511 02/11/21 1622 02/12/21 0500  Weight: 71.3 kg 70.4 kg 70.3 kg    Intake/Output Summary (Last 24 hours) at 02/12/2021 0943 Last data filed at 02/12/2021 0900 Gross per 24 hour  Intake 240 ml  Output 969 ml  Net -729 ml     Additional Objective Labs: Basic Metabolic Panel: Recent Labs  Lab 02/07/21 0619 02/08/21 0608 02/11/21 1151  NA 132* 130* 128*  K 4.3 4.7 4.2  CL 93* 91* 88*  CO2 '27 24 27  '$ GLUCOSE 146* 115* 112*  BUN 25* 40* 33*  CREATININE 5.36* 7.36* 9.47*  CALCIUM 9.1 9.9 10.8*  PHOS 3.4 4.5 3.5   Liver Function Tests: Recent Labs  Lab 02/07/21 0619 02/08/21 0608 02/11/21 1151  ALBUMIN 2.6* 2.5* 2.4*    CBC: Recent Labs  Lab 02/06/21 0145 02/08/21 1439 02/11/21 1151  WBC 7.7 9.4 10.4  NEUTROABS  --   --  7.7  HGB 8.2* 7.6* 8.7*  HCT 25.6* 24.2* 26.6*  MCV 93.1 94.2 91.7  PLT 421* 458* 574*   Blood Culture    Component Value Date/Time   SDES BLOOD RIGHT ANTECUBITAL 01/28/2021 1812   SPECREQUEST  01/28/2021 1812    BOTTLES DRAWN AEROBIC AND ANAEROBIC Blood Culture adequate volume   CULT  01/28/2021 1812    NO GROWTH 5 DAYS Performed at [facility], [address], [place], [place] [address]     REPTSTATUS 02/02/2021 FINAL 01/28/2021 1812    CBG: Recent Labs  Lab 02/11/21 0616 02/11/21 1138 02/11/21 1724 02/11/21 2106 02/12/21 0615  GLUCAP 148* 161* 130* 205* 169*    Medications:  iron sucrose      amLODipine  5 mg Oral Daily   aspirin EC  81 mg Oral Daily   atorvastatin  40 mg Oral Daily   bictegravir-emtricitabine-tenofovir AF  1 tablet Oral Daily   bisacodyl  5 mg Oral Daily   calcitRIOL  1 mcg Oral Daily   calcium acetate  2,001 mg Oral TID WC   clopidogrel  75 mg Oral Daily   darbepoetin (ARANESP) injection - DIALYSIS  100 mcg Intravenous Q Tue-HD   feeding supplement (NEPRO CARB STEADY)  237 mL Oral BID BM   gabapentin  100 mg Oral Once per day on Tue Thu Sat   heparin  5,000 Units Subcutaneous Q8H   insulin aspart  0-15 Units Subcutaneous TID WC   insulin glargine  5 Units Subcutaneous QHS   multivitamin  1 tablet Oral QHS   oxymetazoline  1 spray Each Nare BID   pantoprazole  40 mg Oral Daily   polyethylene glycol  17 g Oral BID   senna  2 tablet Oral BID   sertraline  50 mg Oral QHS    Dialysis Orders: [facility]-TTS 4h   75 kg    400/1.5    2/ 2.0 bath  AVF Hep 3000 -Calcitriol 1.25 mcg p.o. q. 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 ESRD: HD TTS -continue per schedule; holding heparin due to oozing from BKA, may be able to restart soon Hypertension/volume: Under EDW, expected post BKA, lower dry on d/c ~69-70kg.  Continue to challenge dry weight as able.  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  Continue Aranesp 184mg qSat.  Venofer '100mg'$  qHD x 4 given.  Transfuse as needed.   Secondary Hyperparathyroidism: holding phoslo given Phos now 3.5 and Calcium high at 10.8 w/o correcting for albumin. On 2 Ca bath. Hold calcitriol given hypercalcemia and check PTH.   Nutrition: Albumin low related to acute chronic illness.  Encourage protein HIV-meds per primary Diabetes mellitus type 2-per primary Depressive disorder on sertraline per primary Disposition: [facility] in CIR. [facility] 02/12/2021,9:43 AM  LOS: 4 days

## 2021-02-12 NOTE — Progress Notes (Signed)
Recreational Therapy Discharge Summary Patient Details  Name: Kathy Frank MRN: [id number] Date of Birth: 03-25-1976 Today's Date: 02/12/2021  Long term goals set: 1  Long term goals met: 1  Comments on progress toward goals: TR sessions focused on safe mobility during leisure pursuits, pt education on energy conservation, self monitoring, coping strategies, activity analysis & modifications.  Pt is discharging at supervision level for TR tasks.  Pt with planned discharge 7/9.  Reasons goals not met: n/a  Equipment acquired: n/a  Reasons for discharge: treatment goals met  Patient/family agrees with progress made and goals achieved: Yes  [name] 02/12/2021, 3:20 PM

## 2021-02-12 NOTE — Progress Notes (Signed)
Recreational Therapy Session Note  Patient Details  Name: Kathy Frank MRN: [id number] Date of Birth: 12/23/1975 Today's Date: 02/12/2021  Pain: c/o 6/10 RLE, premedicated.  Requested tylenol at end of session Skilled Therapeutic Interventions/Progress Updates: Pt participated in Therapeutic Activities Group with an emphasis on activity tolerance, w/c mobility/management, dynamic sitting balance, problem solving through obstacles, education on energy conservation/safety awareness, self monitoring and relaxation.  Pt participated in group bowling activity propelling/navigating w/c in tight spaces and managing w/c parts with supervision, min cues for managing leg rest.  Pt safely retrieving items from the floor at supervision w/c level.  Session ended with pt completing deep breathing exercise for 1-2 minutes with instructional cues.  Pt stated she loved the group; was interactive, smiling, laughing and encouraging other pts.  Therapy/Group: Group Therapy Wirt Hemmerich 02/12/2021, 3:06 PM

## 2021-02-13 DIAGNOSIS — Z89511 Acquired absence of right leg below knee: Secondary | ICD-10-CM | POA: Diagnosis not present

## 2021-02-13 DIAGNOSIS — I120 Hypertensive chronic kidney disease with stage 5 chronic kidney disease or end stage renal disease: Secondary | ICD-10-CM | POA: Diagnosis not present

## 2021-02-13 DIAGNOSIS — D62 Acute posthemorrhagic anemia: Secondary | ICD-10-CM | POA: Diagnosis not present

## 2021-02-13 DIAGNOSIS — N2581 Secondary hyperparathyroidism of renal origin: Secondary | ICD-10-CM | POA: Diagnosis not present

## 2021-02-13 DIAGNOSIS — D631 Anemia in chronic kidney disease: Secondary | ICD-10-CM | POA: Diagnosis not present

## 2021-02-13 DIAGNOSIS — I96 Gangrene, not elsewhere classified: Secondary | ICD-10-CM | POA: Diagnosis not present

## 2021-02-13 DIAGNOSIS — N186 End stage renal disease: Secondary | ICD-10-CM | POA: Diagnosis not present

## 2021-02-13 DIAGNOSIS — I739 Peripheral vascular disease, unspecified: Secondary | ICD-10-CM | POA: Diagnosis not present

## 2021-02-13 DIAGNOSIS — K5901 Slow transit constipation: Secondary | ICD-10-CM | POA: Diagnosis not present

## 2021-02-13 DIAGNOSIS — Z89612 Acquired absence of left leg above knee: Secondary | ICD-10-CM | POA: Diagnosis not present

## 2021-02-13 DIAGNOSIS — Z992 Dependence on renal dialysis: Secondary | ICD-10-CM | POA: Diagnosis not present

## 2021-02-13 DIAGNOSIS — E1169 Type 2 diabetes mellitus with other specified complication: Secondary | ICD-10-CM | POA: Diagnosis not present

## 2021-02-13 LAB — GLUCOSE, CAPILLARY
Glucose-Capillary: 200 mg/dL — ABNORMAL HIGH (ref 70–99)
Glucose-Capillary: 129 mg/dL — ABNORMAL HIGH (ref 70–99)
Glucose-Capillary: 264 mg/dL — ABNORMAL HIGH (ref 70–99)

## 2021-02-13 LAB — PARATHYROID HORMONE, INTACT (NO CA): PTH: 24 pg/mL (ref 15–65)

## 2021-02-13 MED ORDER — INSULIN GLARGINE 100 UNIT/ML ~~LOC~~ SOLN
8.0000 [IU] | Freq: Every day | SUBCUTANEOUS | Status: DC
  Administered 2021-02-13: 8 [IU] via SUBCUTANEOUS
  Filled 2021-02-13 (×2): qty 0.08

## 2021-02-13 NOTE — Progress Notes (Addendum)
Occupational Therapy Session Note  Patient Details  Name: CHLOEANNE POTEET MRN: [id number] Date of Birth: 1976-02-20  Today's Date: 02/13/2021 OT Individual Time: 1045-1130 OT Individual Time Calculation (min): 45 min    Short Term Goals: Week 1:  OT Short Term Goal 1 (Week 1): STGs=LTGs due to ELOS.  Skilled Therapeutic Interventions/Progress Updates:    Pt seen this session to teach her and have her practice several types of home exercises she can do at home.  Had pt turn on music she likes to listen to. 1 set with 1# bar for warm up and 2nd set with 4 lb bar of 12 reps each of chest press, sh press, bicep curls and "kayaking" to engage her core (at home pt can use a broom stick) 1 set with level 2 theraband and 1 set with level 3 band of 12 reps each of alternating lat pull downs, tricep extensions and alternating bow and arrow rows W/c dances with arm circles overhead, forward, and to the sides followed by torso twists and fun dance moves.  Encouraged pt to use these exercises at home to keep up her endurance and arm strength.  Pt received her new slide board. Pt practiced setting up wc and using board to bed with cues for strategy with locking breaks near a hard surface of bed rail and to keep shoulders over hips as she leaned back slightly as she was at the last portion of the transfers.  Pt in bed with all needs met.     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 Restrictions Weight Bearing Restrictions: Yes RLE Weight Bearing: Non weight bearing    Vital Signs: Therapy Vitals Temp: 98.2 F (36.8 C) Temp Source: Oral Pulse Rate: 74 Resp: 18 BP: (!) 170/80 Patient Position (if appropriate): Lying Oxygen Therapy SpO2: 100 % O2 Device: Room Air Pain: Pain  Assessment Pain Scale: 0-10 Pain Score: 8  Pain Intervention(s): Medication (See eMAR)        Therapy/Group: Individual Therapy  [place] 02/13/2021, 8:28 AM

## 2021-02-13 NOTE — Discharge Summary (Signed)
Physician Discharge Summary  Patient ID: Kathy Frank MRN: [id number] DOB/AGE: 1975/10/25 45 y.o.  Admit date: 02/08/2021 Discharge date: 02/15/2021  Discharge Diagnoses:  Principal Problem:   Right below-knee amputee [facility]) DVT prophylaxis Pain management End-stage renal disease Acute on chronic anemia HIV Hyperlipidemia Diabetes mellitus GERD Hypertension Constipation History of left AKA  Discharged Condition: Stable  Significant Diagnostic Studies: DG Chest 1 View  Result Date: 01/28/2021 CLINICAL DATA:  Questionable sepsis - evaluate for abnormality Patient reports right foot pain. EXAM: CHEST  1 VIEW COMPARISON:  Radiograph 06/17/2020 FINDINGS: Stable cardiomegaly. Unchanged mediastinal contours. Aortic atherosclerosis. No focal airspace disease, pulmonary edema, pleural effusion or pneumothorax. No acute osseous abnormalities are seen. IMPRESSION: Stable cardiomegaly. No acute chest findings. Electronically Signed   By: Keith Rake M.D.   On: 01/28/2021 19:57   MR FOOT RIGHT WO CONTRAST  Result Date: 01/29/2021 CLINICAL DATA:  Diabetic on dialysis with foot pain and swelling. EXAM: MRI OF THE RIGHT FOREFOOT WITHOUT CONTRAST TECHNIQUE: Multiplanar, multisequence MR imaging of the right foot was performed. No intravenous contrast was administered. COMPARISON:  Radiographs 01/28/2021 FINDINGS: Examination is quite limited due to patient motion. There is subcutaneous soft tissue swelling/edema suggesting cellulitis. There is also diffuse myositis involving the forefoot musculature. No obvious discrete fluid collections to suggest subcutaneous abscess or pyomyositis. Grossly the bony structures are intact. I do not see any obvious findings for septic arthritis or osteomyelitis but the exam is quite limited. IMPRESSION: 1. Very limited examination due to patient motion. 2. Diffuse cellulitis and myositis but no obvious discrete subcutaneous abscess or pyomyositis. 3. No obvious  findings for septic arthritis or osteomyelitis. Electronically Signed   By: Marijo Sanes M.D.   On: 01/29/2021 08:41   DG Foot Complete Right  Result Date: 01/28/2021 CLINICAL DATA:  Questionable sepsis - evaluate for abnormality Right foot blistering, being followed by podiatry. EXAM: RIGHT FOOT COMPLETE - 3+ VIEW COMPARISON:  Most recent comparison radiograph 09/08/2019 FINDINGS: There is no evidence of fracture or dislocation. No erosion or bone destruction. Similar well-marginated defect about the medial aspect of the first toe proximal phalanx. Age advanced vascular calcifications. Dorsal soft tissue edema. There is air in the soft tissues medial to the great toe distal phalanx. No radiopaque foreign body. IMPRESSION: 1. Air in the soft tissues medial to the great toe distal phalanx, may represent wound or soft tissue infection. No radiopaque foreign body or radiographic findings of osteomyelitis. 2. Dorsal soft tissue edema. Age advanced vascular calcifications. Electronically Signed   By: Keith Rake M.D.   On: 01/28/2021 19:59    Labs:  Basic Metabolic Panel: Recent Labs  Lab 02/07/21 0619 02/08/21 0608 02/11/21 1151  NA 132* 130* 128*  K 4.3 4.7 4.2  CL 93* 91* 88*  CO2 '27 24 27  '$ GLUCOSE 146* 115* 112*  BUN 25* 40* 33*  CREATININE 5.36* 7.36* 9.47*  CALCIUM 9.1 9.9 10.8*  PHOS 3.4 4.5 3.5    CBC: Recent Labs  Lab 02/08/21 1439 02/11/21 1151  WBC 9.4 10.4  NEUTROABS  --  7.7  HGB 7.6* 8.7*  HCT 24.2* 26.6*  MCV 94.2 91.7  PLT 458* 574*    CBG: Recent Labs  Lab 02/12/21 2102 02/12/21 2127 02/13/21 0559 02/13/21 1138 02/13/21 2104  GLUCAP 67* 96 200* 129* 264*   Family history.  Mother and brother as well as daughter with diabetes.  Denies any colon cancer esophageal cancer or rectal cancer  Brief HPI:   Kathy Frank is a 45 y.o. right-handed female with history of type 2 diabetes mellitus end-stage renal disease with hemodialysis HIV quit smoking 6  years ago left AKA 2020 peripheral vascular disease with multiple revascularization procedures.  Per chart review patient lives with her children.  1 level home 4 steps to entry.  Use a rolling walker for mobilization as well as a wheelchair.  Presented 01/28/2021 with progressive right foot rest pain and nonhealing wounds.  Patient has undergone revascularization procedures to right lower extremity in the past.  Noted progressive ischemic changes to the limb and not felt to be salvageable.  Patient underwent right BKA 01/31/2021 per Dr. Trula Slade.  [facility] course pain management.  Hemodialysis ongoing as per renal services.  Subcutaneous heparin for DVT prophylaxis.  Acute on chronic anemia latest hemoglobin 8.2.  Therapy evaluations completed due to patient decreased functional mobility was admitted for a comprehensive rehab program.   Hospital Course: Kathy Frank was admitted to rehab 02/08/2021 for inpatient therapies to consist of PT, ST and OT at least three hours five days a week. Past admission physiatrist, therapy team and rehab RN have worked together to provide customized collaborative inpatient rehab.  Pertaining to patient's right BKA 01/31/2021 as well as history of left AKA.  Patient would follow-up with vascular surgery Dr. Trula Slade.  Subcutaneous heparin for DVT prophylaxis.  No bleeding episodes.  Patient remained on chronic aspirin and Plavix as prior to admission.  Pain managed use of Neurontin scheduled as directed as well as oxycodone for breakthrough pain.  Mood stabilization with Zoloft emotional support provided.  End-stage renal disease with hemodialysis as per renal services.  Acute on chronic anemia latest hemoglobin 8.7 Aranesp as advised.  Patient did have history of HIV remained on biktarvy.  Lipitor ongoing for hyperlipidemia.  History of diabetes mellitus blood sugars as directed remained on insulin therapy with diabetic teaching.  Bouts of constipation resolved with laxative  assistance.   Blood pressures were monitored on TID basis and soft and monitored  Diabetes has been monitored with ac/hs CBG checks and SSI was use prn for tighter BS control.    Rehab course: During patient's stay in rehab weekly team conferences were held to monitor patient's progress, set goals and discuss barriers to discharge. At admission, patient required minimal guard lateral scoot transfers set up upper body bathing minimal guard lower body bathing set up upper body dressing minimal guard lower body dressing  Physical exam. Blood pressure 173/81 pulse 83 temperature 99.3 respirations 18 oxygen saturations 100% room air Constitutional.  No acute distress HEENT Head.  Normocephalic and atraumatic Eyes.  Pupils round and reactive to light no discharge.nystagmus Neck.  Supple nontender no JVD without thyromegaly Cardiac regular rate rhythm any extra sounds or murmur heard Abdomen.  Soft nontender positive bowel sounds without rebound Respiratory effort normal no respiratory distress without wheeze Musculoskeletal. General.  Swelling and tenderness present.  Normal range of motion Skin.  Warm and dry right BKA site dressed appropriately tender limb protector in place.  Incision with slight serosanguineous drainage.  Left AKA well-healed. Neurologic.  Alert oriented normal insight and awareness.  Cranial nerve exam unremarkable.  Motor 5/5 upper extremity, left lower extremity 5/5 hip flexor and right lower extremity.  Patient can lift leg and flex/extend against gravity  He/She  has had improvement in activity tolerance, balance, postural control as well as ability to compensate for deficits. He/She has had improvement in functional use RUE/LUE  and RLE/LLE as well as  improvement in awareness.  Patient performed ramp negotiation wheelchair contact-guard assist for threshold then close supervision.  Patient performed car transfers with sliding board close supervision to contact-guard for  safety.  Patient able to manage to remove leg rest on right with cues for car transfer.  Transfers back to bed with slide board close supervision.  She can gather belongings for activities day living and homemaking.  Lateral transfers mat to wheelchair with slide board close supervision.  Full family teaching completed plan discharged to home       Disposition: Discharged to home    Diet: Renal diabetic diet  Special Instructions: No driving smoking or alcohol  Medications at discharge 1.  Tylenol as needed 2.  Norvasc 5 mg p.o. daily 3.  Aspirin 81 mg p.o. daily 4.  Lipitor 40 mg p.o. daily 5.Biktarvy 50-200-25 mg daily 6.  Plavix 75 mg p.o. daily 7.  Neurontin 100 mg p.o. 3 times weekly Tuesday Thursday Saturday 8.  Lantus insulin 5 units bedtime 9.  Rena-Vite 1 tablet nightly 10.  Oxycodone 5 mg every 4 hours as needed moderate pain 11.  Protonix 40 mg p.o. daily 12.  MiraLAX twice daily hold for loose stools 13.  Senokot 2 tablets twice daily 14.  Zoloft 50 mg p.o. nightly  30-35 minutes were spent completing discharge summary and discharge planning   Discharge Instructions     Ambulatory referral to Physical Medicine Rehab   Complete by: As directed    Moderate complexity follow-up 1 to 2 weeks right BKA with history of left AKA        Follow-up Information     Meredith Staggers, MD Follow up.   Specialty: Physical Medicine and Rehabilitation Why: Office to call for appointment Contact information: [address] [place] [address] [phone]         Serafina Mitchell, MD Follow up.   Specialties: Vascular Surgery, Cardiology Why: Call for appointment Contact information: [place] [address] [phone]         Corliss Parish, MD Follow up.   Specialty: Nephrology Why: Call for appointment Contact information: [place] [address] [phone]                 Signed: Cathlyn Parsons 02/14/2021, 5:23 AM

## 2021-02-13 NOTE — Progress Notes (Signed)
PROGRESS NOTE   Subjective/Complaints: Up early. No new issues  ROS: Patient denies fever, rash, sore throat, blurred vision, nausea, vomiting, diarrhea, cough, shortness of breath or chest pain, joint or back pain, headache, or mood change.    Objective:   No results found. Recent Labs    02/11/21 1151  WBC 10.4  HGB 8.7*  HCT 26.6*  PLT 574*   Recent Labs    02/11/21 1151  NA 128*  K 4.2  CL 88*  CO2 27  GLUCOSE 112*  BUN 33*  CREATININE 9.47*  CALCIUM 10.8*    Intake/Output Summary (Last 24 hours) at 02/13/2021 1158 Last data filed at 02/13/2021 0700 Gross per 24 hour  Intake 720 ml  Output --  Net 720 ml        Physical Exam: Vital Signs Blood pressure (!) 170/80, pulse 74, temperature 98.2 F (36.8 C), temperature source Oral, resp. rate 18, weight 70.1 kg, SpO2 100 %.  Constitutional: No distress . Vital signs reviewed. obese HEENT: EOMI, oral membranes moist Neck: supple Cardiovascular: RRR without murmur. No JVD    Respiratory/Chest: CTA Bilaterally without wheezes or rales. Normal effort    GI/Abdomen: BS +, non-tender, non-distended Ext: no clubbing, cyanosis, or edema Psych: pleasant and cooperative Skin: right stump with only minimal s/s drainage. Some ischemic areas on incision Neuro: Pt is cognitively appropriate with normal insight, memory, and awareness. Cranial nerves 2-12 are intact. Sensory exam is normal. Reflexes are 2+ in all 4's. Fine motor coordination is intact. No tremors. Motor function is grossly 5/5.  Musculoskeletal: right BK limb remains swollen and tender.    Assessment/Plan: 1. Functional deficits which require 3+ hours per day of interdisciplinary therapy in a comprehensive inpatient rehab setting. Physiatrist is providing close team supervision and 24 hour management of active medical problems listed below. Physiatrist and rehab team continue to assess barriers to  discharge/monitor patient progress toward functional and medical goals  Care Tool:  Bathing    Body parts bathed by patient: Right arm, Left arm, Chest, Abdomen, Front perineal area, Right upper leg, Left upper leg, Face, Buttocks     Body parts n/a: Right lower leg, Left lower leg   Bathing assist Assist Level: Set up assist     Upper Body Dressing/Undressing Upper body dressing   What is the patient wearing?: Pull over shirt    Upper body assist Assist Level: Set up assist    Lower Body Dressing/Undressing Lower body dressing      What is the patient wearing?: Pants, Underwear/pull up     Lower body assist Assist for lower body dressing: Set up assist     Toileting Toileting    Toileting assist Assist for toileting: Contact Guard/Touching assist     Transfers Chair/bed transfer  Transfers assist     Chair/bed transfer assist level: Supervision/Verbal cueing (anterior/posterior transfers bed<>w/c)     Locomotion Ambulation   Ambulation assist   Ambulation activity did not occur: N/A          Walk 10 feet activity   Assist  Walk 10 feet activity did not occur: N/A        Walk 50  feet activity   Assist Walk 50 feet with 2 turns activity did not occur: N/A         Walk 150 feet activity   Assist Walk 150 feet activity did not occur: N/A         Walk 10 feet on uneven surface  activity   Assist Walk 10 feet on uneven surfaces activity did not occur: N/A         Wheelchair     Assist Will patient use wheelchair at discharge?: Yes Type of Wheelchair: Manual    Wheelchair assist level: Supervision/Verbal cueing Max wheelchair distance: 70    Wheelchair 50 feet with 2 turns activity    Assist        Assist Level: Supervision/Verbal cueing   Wheelchair 150 feet activity     Assist  Wheelchair 150 feet activity did not occur: Refused       Blood pressure (!) 170/80, pulse 74, temperature 98.2 F  (36.8 C), temperature source Oral, resp. rate 18, weight 70.1 kg, SpO2 100 %.  Medical Problem List and Plan: 1.  Debility secondary to right BKA 01/31/2021 with history of left AKA             -patient may  shower if right BKA incision is covered             -ELOS/Goals: 02/15/21,  mod I at w/c level for PT and OT  -Continue CIR therapies including PT, OT  2.  Antithrombotics: -DVT/anticoagulation: Subcutaneous heparin             -antiplatelet therapy: Aspirin 81 mg daily and Plavix 75 mg daily 3. Pain Management: Neurontin 100 mg 3 times weekly, oxycodone as needed             -massage, edema control right residual limb has been discussed 4. Mood: Zoloft 50 mg nightly             -antipsychotic agents: N/A 5. Neuropsych: This patient is capable of making decisions on her own behalf. 6. Skin/Wound Care: non-adherent dressing, 4x4, kerlix, ACE to stump  -continue local dressing 7. Fluids/Electrolytes/Nutrition: Routine in and outs with follow-up chemistries 8. ESRD.  Continue hemodialysis as directed. HD scheduled after therapies to allow max participation during the day. 9.  Acute on chronic anemia.  Follow-up CBC.  Most recent hgb 8.7 -Continue Aranesp -transfuse in HD if needed 10.  HIV: BIKTARVY 11.  Hyperlipidemia.  Lipitor 12.  Diabetes mellitus.  5 units nightly.  Check blood sugars before meals and at bedtime             -adjust regimen as needed  CBG (last 3)  Recent Labs    02/12/21 2127 02/13/21 0559 02/13/21 1138  GLUCAP 96 200* 129*  Increase lantus to 8u 7/7  13.  GERD.  Protonix 14.  Constipation/nausea. improved -scheduled senna bid -prn zofran for nausea   15.  HTN- As per nephrology Vitals:   02/12/21 1944 02/13/21 0441  BP: (!) 141/59 (!) 170/80  Pulse: 72 74  Resp: 16 18  Temp: 98.6 F (37 C) 98.2 F (36.8 C)  SpO2: 99% 100%     LOS: 5 days A FACE TO FACE EVALUATION WAS PERFORMED  Meredith Staggers 02/13/2021, 11:58 AM

## 2021-02-13 NOTE — Progress Notes (Signed)
Physical Therapy Session Note  Patient Details  Name: Kathy Frank MRN: [id number] Date of Birth: 11-Jul-1976  Today's Date: 02/13/2021 PT Individual Time: [id number] PT Individual Time Calculation (min): 44 min   Short Term Goals: Week 1:  PT Short Term Goal 1 (Week 1): LTG=STGs due to ELOS.  Skilled Therapeutic Interventions/Progress Updates:    Patient in supine and reports did not sleep well.  States limb protector hurting her residual limb today and did not want to don.  Performed slide board transfer to w/c with close S and cues for weight shift and avoiding wheel.  Patient propelled w/c x 150' to therapy gym then back to dayroom.  Patient performed 5 minutes on UBE 1/2 forward and 1/2 reverse.  Played Wii bowling for UE coordination, sitting balance and core strength on edge of chair with armrests back x 15 minutes.  Propelled to room with S.  Left in chair with alarm belt active and needs in reach.   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 Restrictions Weight Bearing Restrictions: Yes RLE Weight Bearing: Non weight bearing  Pain: Pain Assessment Pain Scale: 0-10 Pain Score: 4  Pain Type: Acute pain Pain Location: Leg Pain Orientation: Right Pain Descriptors / Indicators: Sore Pain Onset: On-going Pain Intervention(s): Repositioned;Emotional support;Rest;Distraction Multiple Pain Sites: No   Therapy/Group: Individual Therapy  Reginia Naas Magda Kiel, PT 02/13/2021, 12:37 PM

## 2021-02-13 NOTE — Progress Notes (Signed)
[facility] Progress Note   Subjective:   Patient feels well today with no concerns  Objective Vitals:   02/12/21 0500 02/12/21 0506 02/12/21 1944 02/13/21 0441  BP:  (!) 184/84 (!) 141/59 (!) 170/80  Pulse:  82 72 74  Resp:  '16 16 18  '$ Temp:  98.4 F (36.9 C) 98.6 F (37 C) 98.2 F (36.8 C)  TempSrc:  Oral Oral Oral  SpO2:  100% 99% 100%  Weight: 70.3 kg   70.1 kg   Physical Exam General:chronically ill appearing female in NAD Heart: Normal rate, no audible murmur Lungs: Bilateral chest rise with no increased work of breathing Abdomen:soft, NTND Extremities:L AKA - no edema, R BKA dressed  Dialysis Access: LU AVF +b/t   Filed Weights   02/11/21 1622 02/12/21 0500 02/13/21 0441  Weight: 70.4 kg 70.3 kg 70.1 kg    Intake/Output Summary (Last 24 hours) at 02/13/2021 0950 Last data filed at 02/13/2021 0700 Gross per 24 hour  Intake 720 ml  Output --  Net 720 ml     Additional Objective Labs: Basic Metabolic Panel: Recent Labs  Lab 02/07/21 0619 02/08/21 0608 02/11/21 1151  NA 132* 130* 128*  K 4.3 4.7 4.2  CL 93* 91* 88*  CO2 '27 24 27  '$ GLUCOSE 146* 115* 112*  BUN 25* 40* 33*  CREATININE 5.36* 7.36* 9.47*  CALCIUM 9.1 9.9 10.8*  PHOS 3.4 4.5 3.5   Liver Function Tests: Recent Labs  Lab 02/07/21 0619 02/08/21 0608 02/11/21 1151  ALBUMIN 2.6* 2.5* 2.4*    CBC: Recent Labs  Lab 02/08/21 1439 02/11/21 1151  WBC 9.4 10.4  NEUTROABS  --  7.7  HGB 7.6* 8.7*  HCT 24.2* 26.6*  MCV 94.2 91.7  PLT 458* 574*   Blood Culture    Component Value Date/Time   SDES BLOOD RIGHT ANTECUBITAL 01/28/2021 1812   SPECREQUEST  01/28/2021 1812    BOTTLES DRAWN AEROBIC AND ANAEROBIC Blood Culture adequate volume   CULT  01/28/2021 1812    NO GROWTH 5 DAYS Performed at [facility], [address] [place], [place] [address]    REPTSTATUS 02/02/2021 FINAL 01/28/2021 1812    CBG: Recent Labs  Lab 02/12/21 1159 02/12/21 1646  02/12/21 2102 02/12/21 2127 02/13/21 0559  GLUCAP 165* 219* 67* 96 200*    Medications:  iron sucrose      amLODipine  5 mg Oral Daily   aspirin EC  81 mg Oral Daily   atorvastatin  40 mg Oral Daily   bictegravir-emtricitabine-tenofovir AF  1 tablet Oral Daily   bisacodyl  5 mg Oral Daily   clopidogrel  75 mg Oral Daily   darbepoetin (ARANESP) injection - DIALYSIS  100 mcg Intravenous Q Tue-HD   feeding supplement (NEPRO CARB STEADY)  237 mL Oral BID BM   gabapentin  100 mg Oral Once per day on Tue Thu Sat   heparin  5,000 Units Subcutaneous Q8H   insulin aspart  0-15 Units Subcutaneous TID WC   insulin glargine  5 Units Subcutaneous QHS   multivitamin  1 tablet Oral QHS   pantoprazole  40 mg Oral Daily   polyethylene glycol  17 g Oral BID   senna  2 tablet Oral BID   sertraline  50 mg Oral QHS    Dialysis Orders: [facility]-TTS 4h   75 kg    400/1.5    2/ 2.0 bath  AVF Hep 3000 -Calcitriol 1.25 mcg p.o. q. 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 ESRD: HD TTS -continue per schedule; holding heparin due to oozing from BKA, may be able to restart soon if needed Hypertension/volume: Under EDW, expected post BKA, lower dry on d/c ~69-70kg.  Continue to challenge dry weight as able.  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  Continue Aranesp 161mg qSat.  Venofer '100mg'$  qHD x 4 given.  Transfuse as needed.   Secondary Hyperparathyroidism: holding phoslo given Phos now 3.5 and Calcium high at 10.8 w/o correcting for albumin. On 2 Ca bath. Hold calcitriol given hypercalcemia and f/u PTH.  Nutrition: Albumin low related to acute chronic illness.  Encourage protein HIV-meds per primary Diabetes mellitus type 2-per primary Depressive disorder on sertraline per primary Disposition: Now in CIR. [facility] 02/13/2021,9:50 AM  LOS: 5 days

## 2021-02-13 NOTE — Progress Notes (Signed)
Occupational Therapy Session Note  Patient Details  Name: Kathy Frank MRN: [id number] Date of Birth: 1976/06/21  Today's Date: 02/13/2021 OT Individual Time: 6606-3016 OT Individual Time Calculation (min): 62 min    Short Term Goals: Week 1:  OT Short Term Goal 1 (Week 1): STGs=LTGs due to ELOS.  Skilled Therapeutic Interventions/Progress Updates:    Pt received seated in bed, changing clothes and requesting water for sponge bath bed level, agreeable to OT, reporting 4/10 pain in R residual limb. Set up A sponge bath in bed. Set up A UB/LB dressing bed level, rolling to don shorts. Pt expressed feeling nervous about d/c on Saturday, stating "I am not ready to go out in the real world." Emotional support provided with slight improved affect; later on pt discussed ongoing issues with anxiety/depression - discussion of recommendation for counseling following d/c with pt in agreement. Education and practice provided on figure 8 ace bandage wrapping technique with pt requiring min A and min vc's to complete; when ace wrap donned, pt noted with slight drainage on bandage beneath; RN informed. Education and practice of desensitization rubbing/tapping techniques on R residual limb; noted decreased sensation on distal/medial aspect. Donned limb protector min A. Pt completed grooming tasks bed level, took two calls from dtr. Engaged in bed level exercises per pt request targeting general conditioning and pain relief. Supine hip EXT bilaterally 15x, modified cobra in supine 5x for hold of 10. Stretching seated in long sit: trunk FLX and lateral oblique stretches 5x for hold of 10. Engaged in [place] using green level 3 theraband: shoulder abduction, forward punches, and diagonal pulls up 1 set of 10x. Pt demoing slight SOB throughout session; education provided on diaphragmatic breathing techniques with good carryover during session. Pt remained seated in bed, alarm set, call bell in reach, all immediate needs  met.  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 Restrictions Weight Bearing Restrictions: Yes RLE Weight Bearing: Non weight bearing  Pain: Pain Assessment Pain Scale: 0-10 Pain Score: 4  Pain Type: Acute pain Pain Location: Leg Pain Orientation: Right Pain Descriptors / Indicators: Sore Pain Onset: On-going Pain Intervention(s): Repositioned;Emotional support;Rest;Distraction Multiple Pain Sites: No    Therapy/Group: Individual Therapy  Mellissa Kohut 02/13/2021, 8:23 AM

## 2021-02-13 NOTE — Progress Notes (Signed)
Ok to repeat her HIV viral load while she is here per Dr. Naaman Plummer since she missed an appt with Dr. Megan Salon by being inpt.  Onnie Boer, PharmD, BCIDP, AAHIVP, CPP Infectious Disease Pharmacist 02/13/2021 1:34 PM

## 2021-02-14 ENCOUNTER — Encounter (HOSPITAL_COMMUNITY): Payer: Self-pay

## 2021-02-14 ENCOUNTER — Other Ambulatory Visit (HOSPITAL_COMMUNITY): Payer: Self-pay

## 2021-02-14 DIAGNOSIS — I120 Hypertensive chronic kidney disease with stage 5 chronic kidney disease or end stage renal disease: Secondary | ICD-10-CM | POA: Diagnosis not present

## 2021-02-14 DIAGNOSIS — Z992 Dependence on renal dialysis: Secondary | ICD-10-CM | POA: Diagnosis not present

## 2021-02-14 DIAGNOSIS — I96 Gangrene, not elsewhere classified: Secondary | ICD-10-CM | POA: Diagnosis not present

## 2021-02-14 DIAGNOSIS — D631 Anemia in chronic kidney disease: Secondary | ICD-10-CM | POA: Diagnosis not present

## 2021-02-14 DIAGNOSIS — Z89612 Acquired absence of left leg above knee: Secondary | ICD-10-CM | POA: Diagnosis not present

## 2021-02-14 DIAGNOSIS — N186 End stage renal disease: Secondary | ICD-10-CM | POA: Diagnosis not present

## 2021-02-14 DIAGNOSIS — K5901 Slow transit constipation: Secondary | ICD-10-CM | POA: Diagnosis not present

## 2021-02-14 DIAGNOSIS — N2581 Secondary hyperparathyroidism of renal origin: Secondary | ICD-10-CM | POA: Diagnosis not present

## 2021-02-14 DIAGNOSIS — Z89511 Acquired absence of right leg below knee: Secondary | ICD-10-CM | POA: Diagnosis not present

## 2021-02-14 DIAGNOSIS — E1169 Type 2 diabetes mellitus with other specified complication: Secondary | ICD-10-CM | POA: Diagnosis not present

## 2021-02-14 DIAGNOSIS — I739 Peripheral vascular disease, unspecified: Secondary | ICD-10-CM | POA: Diagnosis not present

## 2021-02-14 DIAGNOSIS — D62 Acute posthemorrhagic anemia: Secondary | ICD-10-CM | POA: Diagnosis not present

## 2021-02-14 LAB — RENAL FUNCTION PANEL
Albumin: 2.8 g/dL — ABNORMAL LOW (ref 3.5–5.0)
Anion gap: 12 (ref 5–15)
BUN: 14 mg/dL (ref 6–20)
CO2: 27 mmol/L (ref 22–32)
Calcium: 9.4 mg/dL (ref 8.9–10.3)
Chloride: 94 mmol/L — ABNORMAL LOW (ref 98–111)
Creatinine, Ser: 5.6 mg/dL — ABNORMAL HIGH (ref 0.44–1.00)
GFR, Estimated: 9 mL/min — ABNORMAL LOW (ref >60)
Glucose, Bld: 59 mg/dL — ABNORMAL LOW (ref 70–99)
Phosphorus: 2.6 mg/dL (ref 2.5–4.6)
Potassium: 3.9 mmol/L (ref 3.5–5.1)
Sodium: 133 mmol/L — ABNORMAL LOW (ref 135–145)

## 2021-02-14 LAB — GLUCOSE, CAPILLARY
Glucose-Capillary: 165 mg/dL — ABNORMAL HIGH (ref 70–99)
Glucose-Capillary: 56 mg/dL — ABNORMAL LOW (ref 70–99)
Glucose-Capillary: 102 mg/dL — ABNORMAL HIGH (ref 70–99)
Glucose-Capillary: 158 mg/dL — ABNORMAL HIGH (ref 70–99)
Glucose-Capillary: 129 mg/dL — ABNORMAL HIGH (ref 70–99)

## 2021-02-14 LAB — HIV-1 RNA QUANT-NO REFLEX-BLD
HIV 1 RNA Quant: <20 copies/mL
LOG10 HIV-1 RNA: UNDETERMINED log10copy/mL

## 2021-02-14 MED ORDER — GABAPENTIN 100 MG PO CAPS
100.0000 mg | ORAL_CAPSULE | ORAL | 0 refills | Status: DC
  Filled 2021-02-14: qty 90, 30d supply, fill #0

## 2021-02-14 MED ORDER — ACETAMINOPHEN 325 MG PO TABS: 650.0000 mg | ORAL_TABLET | Freq: Four times a day (QID) | ORAL | Status: DC | PRN

## 2021-02-14 MED ORDER — AMLODIPINE BESYLATE 5 MG PO TABS
5.0000 mg | ORAL_TABLET | Freq: Every day | ORAL | 2 refills | Status: DC
  Filled 2021-02-14: qty 30, 30d supply, fill #0

## 2021-02-14 MED ORDER — INSULIN GLARGINE 100 UNIT/ML ~~LOC~~ SOLN
5.0000 [IU] | Freq: Every day | SUBCUTANEOUS | Status: DC
  Administered 2021-02-14: 5 [IU] via SUBCUTANEOUS
  Filled 2021-02-14 (×2): qty 0.05

## 2021-02-14 MED ORDER — ALBUTEROL SULFATE HFA 108 (90 BASE) MCG/ACT IN AERS
2.0000 | INHALATION_SPRAY | RESPIRATORY_TRACT | 0 refills | Status: DC | PRN
  Filled 2021-02-14: qty 8.5, 30d supply, fill #0

## 2021-02-14 MED ORDER — RENA-VITE PO TABS
1.0000 | ORAL_TABLET | Freq: Every day | ORAL | 0 refills | Status: AC
  Filled 2021-02-14: qty 30, 30d supply, fill #0

## 2021-02-14 MED ORDER — LANTUS SOLOSTAR 100 UNIT/ML ~~LOC~~ SOPN
8.0000 [IU] | PEN_INJECTOR | Freq: Every day | SUBCUTANEOUS | 11 refills | Status: DC
  Filled 2021-02-14: qty 3, 28d supply, fill #0

## 2021-02-14 MED ORDER — CHLORHEXIDINE GLUCONATE CLOTH 2 % EX PADS: 6.0000 | MEDICATED_PAD | Freq: Every day | CUTANEOUS | Status: DC

## 2021-02-14 MED ORDER — SERTRALINE HCL 50 MG PO TABS
50.0000 mg | ORAL_TABLET | Freq: Every day | ORAL | 0 refills | Status: DC
  Filled 2021-02-14: qty 30, 30d supply, fill #0

## 2021-02-14 MED ORDER — CLOPIDOGREL BISULFATE 75 MG PO TABS
75.0000 mg | ORAL_TABLET | Freq: Every day | ORAL | 3 refills | Status: DC
  Filled 2021-02-14: qty 90, 90d supply, fill #0

## 2021-02-14 MED ORDER — CALCIUM ACETATE (PHOS BINDER) 667 MG PO CAPS
2001.0000 mg | ORAL_CAPSULE | Freq: Three times a day (TID) | ORAL | 0 refills | Status: DC
  Filled 2021-02-14: qty 180, 20d supply, fill #0

## 2021-02-14 MED ORDER — POLYETHYLENE GLYCOL 3350 17 G PO PACK: 17.0000 g | PACK | Freq: Two times a day (BID) | ORAL | 0 refills | Status: DC

## 2021-02-14 MED ORDER — ATORVASTATIN CALCIUM 40 MG PO TABS
40.0000 mg | ORAL_TABLET | Freq: Every day | ORAL | 3 refills | Status: DC
  Filled 2021-02-14: qty 90, 90d supply, fill #0

## 2021-02-14 MED ORDER — OXYCODONE HCL 5 MG PO TABS
5.0000 mg | ORAL_TABLET | ORAL | 0 refills | Status: DC | PRN
  Filled 2021-02-14: qty 30, 5d supply, fill #0

## 2021-02-14 MED ORDER — CALCITRIOL 0.25 MCG PO CAPS
1.2500 ug | ORAL_CAPSULE | Freq: Every day | ORAL | 0 refills | Status: DC
  Filled 2021-02-14: qty 30, 6d supply, fill #0

## 2021-02-14 MED ORDER — PENTIPS 32G X 4 MM MISC
3 refills | Status: DC
  Filled 2021-02-14: qty 100, 30d supply, fill #0

## 2021-02-14 MED ORDER — PANTOPRAZOLE SODIUM 40 MG PO TBEC
40.0000 mg | DELAYED_RELEASE_TABLET | Freq: Every day | ORAL | 0 refills | Status: DC
  Filled 2021-02-14: qty 30, 30d supply, fill #0

## 2021-02-14 NOTE — Progress Notes (Signed)
[facility] Progress Note   Subjective:    Last HD on 7/7 with 3 kg UF.  She has been working PT using stretch bands today.  Here after right BKA  Review of systems: Reports some shortness of breath with activity Some nausea no vomiting  Denies chest pain  No dizziness or cramping  Objective Vitals:   02/13/21 1830 02/13/21 1929 02/14/21 0415 02/14/21 0500  BP: (!) 120/56 (!) 143/71 (!) 155/119   Pulse: 86 87 80   Resp: '20 18 18   '$ Temp: 98.4 F (36.9 C) 98.6 F (37 C) 98.5 F (36.9 C)   TempSrc: Oral Oral Oral   SpO2: 100% 100% 100%   Weight:    71.9 kg   Physical Exam  General adult female in bed in no acute distress HEENT normocephalic atraumatic extraocular movements intact sclera anicteric. Some periorbital edema Neck supple trachea midline Lungs clear to auscultation bilaterally normal work of breathing at rest room air Heart S1S2 no rub Abdomen soft nontender nondistended Extremities no edema residual limbs; right BKA, left AKA Psych normal mood and affect Neuro - alert and oriented x 3 provides hx and follows commands HD access: AVF b/t   Filed Weights   02/13/21 0441 02/13/21 1400 02/14/21 0500  Weight: 70.1 kg 71.9 kg 71.9 kg    Intake/Output Summary (Last 24 hours) at 02/14/2021 0919 Last data filed at 02/14/2021 0900 Gross per 24 hour  Intake 480 ml  Output 3000 ml  Net -2520 ml     Additional Objective Labs: Basic Metabolic Panel: Recent Labs  Lab 02/08/21 0608 02/11/21 1151  NA 130* 128*  K 4.7 4.2  CL 91* 88*  CO2 24 27  GLUCOSE 115* 112*  BUN 40* 33*  CREATININE 7.36* 9.47*  CALCIUM 9.9 10.8*  PHOS 4.5 3.5   Liver Function Tests: Recent Labs  Lab 02/08/21 0608 02/11/21 1151  ALBUMIN 2.5* 2.4*    CBC: Recent Labs  Lab 02/08/21 1439 02/11/21 1151  WBC 9.4 10.4  NEUTROABS  --  7.7  HGB 7.6* 8.7*  HCT 24.2* 26.6*  MCV 94.2 91.7  PLT 458* 574*   Blood Culture    Component Value Date/Time   SDES BLOOD  RIGHT ANTECUBITAL 01/28/2021 1812   SPECREQUEST  01/28/2021 1812    BOTTLES DRAWN AEROBIC AND ANAEROBIC Blood Culture adequate volume   CULT  01/28/2021 1812    NO GROWTH 5 DAYS Performed at [facility], [place] [address] [place], [place] [address]    REPTSTATUS 02/02/2021 FINAL 01/28/2021 1812    CBG: Recent Labs  Lab 02/12/21 2127 02/13/21 0559 02/13/21 1138 02/13/21 2104 02/14/21 0605  GLUCAP 96 200* 129* 264* 165*    Medications:  iron sucrose Stopped (02/13/21 1641)    amLODipine  5 mg Oral Daily   aspirin EC  81 mg Oral Daily   atorvastatin  40 mg Oral Daily   bictegravir-emtricitabine-tenofovir AF  1 tablet Oral Daily   bisacodyl  5 mg Oral Daily   clopidogrel  75 mg Oral Daily   darbepoetin (ARANESP) injection - DIALYSIS  100 mcg Intravenous Q Tue-HD   feeding supplement (NEPRO CARB STEADY)  237 mL Oral BID BM   gabapentin  100 mg Oral Once per day on Tue Thu Sat   heparin  5,000 Units Subcutaneous Q8H   insulin aspart  0-15 Units Subcutaneous TID WC   insulin glargine  8 Units Subcutaneous QHS   multivitamin  1 tablet Oral QHS  pantoprazole  40 mg Oral Daily   polyethylene glycol  17 g Oral BID   senna  2 tablet Oral BID   sertraline  50 mg Oral QHS    Dialysis Orders: [facility]-TTS 4h   75 kg    400/1.5    2/ 2.0 bath  AVF Hep 3000 -Calcitriol 1.25 mcg p.o. q. 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 ESRD: HD TTS -continue per schedule; holding heparin due to oozing from BKA, may be able to restart soon if needed. Obtain renal panel Hypertension/volume: Under EDW, expected post BKA, lower dry on d/c ~69-70kg.  Continue to challenge dry weight as able.  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  Continue Aranesp 193mg qSat.  Venofer '100mg'$  qHD x 4 given.  Transfuse as  needed.   Secondary Hyperparathyroidism: note phoslo held. Follow renal panel.  Calcium high at 10.8 w/o correcting for albumin. On 2 Ca bath. Hold calcitriol given hypercalcemia and f/u PTH.  Nutrition: Albumin low related to acute chronic illness.  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  LClaudia Desanctis MD [facility] 02/14/2021,9:29 AM  LOS: 6 days

## 2021-02-14 NOTE — Plan of Care (Signed)
  Problem: RH Balance Goal: LTG Patient will maintain dynamic sitting balance (PT) Description: LTG:  Patient will maintain dynamic sitting balance with assistance during mobility activities (PT) Outcome: Completed/Met Goal: LTG Patient will maintain dynamic standing balance (PT) Description: LTG:  Patient will maintain dynamic standing balance with assistance during mobility activities (PT) Outcome: Not Applicable Note: Patient unable to stand due to bilateral amputee, no prosthetic available.   Problem: RH Bed Mobility Goal: LTG Patient will perform bed mobility with assist (PT) Description: LTG: Patient will perform bed mobility with assistance, with/without cues (PT). Outcome: Completed/Met   Problem: RH Bed to Chair Transfers Goal: LTG Patient will perform bed/chair transfers w/assist (PT) Description: LTG: Patient will perform bed to chair transfers with assistance (PT). Outcome: Completed/Met   Problem: RH Car Transfers Goal: LTG Patient will perform car transfers with assist (PT) Description: LTG: Patient will perform car transfers with assistance (PT). Outcome: Completed/Met   Problem: RH Furniture Transfers Goal: LTG Patient will perform furniture transfers w/assist (OT/PT) Description: LTG: Patient will perform furniture transfers  with assistance (OT/PT). Outcome: Completed/Met   Problem: RH Wheelchair Mobility Goal: LTG Patient will propel w/c in controlled environment (PT) Description: LTG: Patient will propel wheelchair in controlled environment, # of feet with assist (PT) Outcome: Completed/Met Goal: LTG Patient will propel w/c in home environment (PT) Description: LTG: Patient will propel wheelchair in home environment, # of feet with assistance (PT). Outcome: Completed/Met  Magda Kiel, PT

## 2021-02-14 NOTE — Progress Notes (Signed)
Occupational Therapy Session Note  Patient Details  Name: Kathy Frank MRN: [id number] Date of Birth: [birth year]  Today's Date: 02/14/2021 OT Individual Time: 1000-1054 OT Individual Time Calculation (min): 54 min    Short Term Goals: Week 1:  OT Short Term Goal 1 (Week 1): STGs=LTGs due to ELOS.  Skilled Therapeutic Interventions/Progress Updates:    Pt received in room in bed and consented to OT tx. Pt requested in room exercises this session as she was c/o increased pain in her R residual limb. Pt issued educational handouts regarding BKA/residual limb care and contracture prevention as well as pain management techniques. Session focused on education and instruction in [place] with handouts provided. Pt instructed in lime green theraband exercises including tricep extension, chest press, bicep flexion, chest pull, shoulder flexion, upright row, and shoulder press  for 3x15 with min cuing for proper technique with good carryover. Pt required frequent rest breaks due to fatigue. After tx, pt left semifowler in bed with alarm on and all needs met.   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 Restrictions Weight Bearing Restrictions: Yes RLE Weight Bearing: Non weight bearing   Pain: 7/10, pt report she just had pain medication      Therapy/Group: Individual Therapy  Yanin Muhlestein 02/14/2021, 10:20 AM

## 2021-02-14 NOTE — Progress Notes (Signed)
Physical Therapy Discharge Summary  Patient Details  Name: ANEYAH LORTZ MRN: [id number] Date of Birth: [birth year]  Today's Date: 02/14/2021 PT Individual Time: 0830-0930 PT Individual Time Calculation (min): 60 min   Skilled Therapeutic Interventions/Progress Updates: Patient in supine and requesting new scrub set.  Obtained and pt donned in supine independent.  Patient supine to sit independent.  Reports will have hospital bed at home and discussed possibility of having one long rail instead of split rails.  Also educated on energy conservation for home including planning ahead for early morning dialysis since will need to get out so plan to wear clothes to bed and have simple snack prepared to get out quickly without stress, etc.  Patient transferred to w/c placing board mod independent.  Propelled w/c independent x 220' to ortho gym.  Patient performed transfer to car at simulated sedan height with S for safety and pt stated felt safer after placing board under both legs (had longer board that she will take home).  Patient reported too much pain in leg and had not had her meds this morning.  Returned to room and RN made aware needing medication.  Patient seated in w/c to perform therex including armchair push ups. Horizontal abduction with green t-band, tricep extensions and bicep curls all x 10.  Patient transferred to bed and performed SLR, hip abduction, SAQ, bridging over bolster, hip adduction squeezes, sidelying hip abduction and reviewed prone hip extension and knee flexion.  Issued all to HEP.  Patient left supine with call bell and needs in reach and bed alarm active.   Patient has met 7 of 8 long term goals due to improved activity tolerance, improved balance, increased strength, and ability to compensate for deficits.  Patient to discharge at a wheelchair level Modified Independent.   Patient's care partner unavailable to provide the necessary physical assistance at discharge.  Reasons  goals not met: Patient achieved all goals appropriate for home, she could not practice dynamic standing due to having bilateral amputations.  Patient able to achieve mod I for all except car transfers,  Daughter will assist for car transfers (supervision level) and patient independent to direct her care.  Recommendation:  Patient will benefit from ongoing skilled PT services in home health setting to continue to advance safe functional mobility, address ongoing impairments in LE strength, high level balance and home safety, and minimize fall risk.  Equipment: Transfer board and R amputee support for her wheelchair.   Reasons for discharge: treatment goals met and discharge from hospital  Patient/family agrees with progress made and goals achieved: Yes  PT Discharge Precautions/Restrictions Precautions Precautions: Fall Precaution Comments: L AKA - has prosthetic but does not use ("too heavy") Required Braces or Orthoses: Other Brace Other Brace: R limb protector, but pt states doesn't want to use 2/2 edema. Restrictions Weight Bearing Restrictions: Yes RLE Weight Bearing: Non weight bearing  Pain Pain Assessment Pain Scale: 0-10 Pain Score: 6  Pain Type: Acute pain Pain Location: Leg Pain Orientation: Right Pain Descriptors / Indicators: Aching Pain Onset: On-going Patients Stated Pain Goal: 1 Pain Intervention(s): RN made aware;Distraction Multiple Pain Sites: No Vision/Perception  Perception Perception: Within Functional Limits Praxis Praxis: Intact  Cognition Overall Cognitive Status: Within Functional Limits for tasks assessed Arousal/Alertness: Awake/alert Orientation Level: Oriented X4 Attention: Selective Memory: Appears intact Problem Solving: Appears intact Safety/Judgment: Appears intact Sensation Sensation Light Touch: Appears Intact Coordination Gross Motor Movements are Fluid and Coordinated: No Coordination and Movement Description: limited by  bilateral  LE amputations and generalized weakness Motor  Motor Motor: Within Functional Limits Motor - Discharge Observations: improved from eval, but still with some core and LE weakness  Mobility Bed Mobility Bed Mobility: Sit to Supine;Supine to Sit;Sitting - Scoot to [name] of Bed;Scooting to [facility] Supine to Sit: Independent with assistive device Sitting - Scoot to Edge of Bed: Independent with assistive device Sit to Supine: Independent with assistive device Scooting to [facility]: Independent with assistive device Transfers Transfers: Lateral/Scoot Transfers Anterior-Posterior Transfer: Independent with assistive device Lateral/Scoot Transfers: Independent with assistive device Transfer (Assistive device): Other (Comment) (slide board) Locomotion  Gait Ambulation: No Gait Gait: No Stairs / Additional Locomotion Stairs: No Ramp: Contact Guard/touching assist Wheelchair Mobility Wheelchair Mobility: Yes Wheelchair Assistance: Independent with Camera operator: Both upper extremities Wheelchair Parts Management: Independent  Trunk/Postural Assessment  Cervical Assessment Cervical Assessment: Within Functional Limits Thoracic Assessment Thoracic Assessment: Exceptions to [facility] (rounded shoulders) Lumbar Assessment Lumbar Assessment: Within Functional Limits Postural Control Postural Control: Within Functional Limits Trunk Control: compensating for changes in COG following amputation  Balance Balance Balance Assessed: Yes Static Sitting Balance Static Sitting - Balance Support: No upper extremity supported;Right upper extremity supported Static Sitting - Level of Assistance: 6: Modified independent (Device/Increase time) Dynamic Sitting Balance Dynamic Sitting - Balance Support: Bilateral upper extremity supported;During functional activity Dynamic Sitting - Level of Assistance: 6: Modified independent (Device/Increase time) Dynamic Sitting - Balance  Activities: Lateral lean/weight shifting;Forward lean/weight shifting;Reaching for objects Extremity Assessment      RLE Assessment Active Range of Motion (AROM) Comments: grossly WFL both hip and knee General Strength Comments: 4/5 hip flexion, 3+/5 at least knee extension (limited testing still painful) LLE Assessment LLE Assessment: Within Functional Limits Active Range of Motion (AROM) Comments: hip WFL prior AKA    Jamison Oka, PT 02/14/2021, 1:01 PM

## 2021-02-14 NOTE — Progress Notes (Signed)
PROGRESS NOTE   Subjective/Complaints: Feeling well, prepared for dc tomorrow. Asked about her incision and dressings  ROS: Patient denies fever, rash, sore throat, blurred vision, nausea, vomiting, diarrhea, cough, shortness of breath or chest pain,  headache, or mood change.    Objective:   No results found. No results for input(s): WBC, HGB, HCT, PLT in the last 72 hours.  Recent Labs    02/14/21 1151  NA 133*  K 3.9  CL 94*  CO2 27  GLUCOSE 59*  BUN 14  CREATININE 5.60*  CALCIUM 9.4    Intake/Output Summary (Last 24 hours) at 02/14/2021 1356 Last data filed at 02/14/2021 1300 Gross per 24 hour  Intake 720 ml  Output 3000 ml  Net -2280 ml        Physical Exam: Vital Signs Blood pressure (!) 155/119, pulse 80, temperature 98.5 F (36.9 C), temperature source Oral, resp. rate 18, weight 71.9 kg, SpO2 100 %.  Constitutional: No distress . Vital signs reviewed. HEENT: EOMI, oral membranes moist Neck: supple Cardiovascular: RRR without murmur. No JVD    Respiratory/Chest: CTA Bilaterally without wheezes or rales. Normal effort    GI/Abdomen: BS +, non-tender, non-distended Ext: no clubbing, cyanosis, or edema Psych: pleasant and cooperative  Skin: right stump incision CDI   Neuro: Pt is cognitively appropriate with normal insight, memory, and awareness. Cranial nerves 2-12 are intact. Sensory exam is normal. Reflexes are 2+ in all 4's. Fine motor coordination is intact. No tremors. Motor function is grossly 5/5.  Musculoskeletal: right residual limb better shaped.    Assessment/Plan: 1. Functional deficits which require 3+ hours per day of interdisciplinary therapy in a comprehensive inpatient rehab setting. Physiatrist is providing close team supervision and 24 hour management of active medical problems listed below. Physiatrist and rehab team continue to assess barriers to discharge/monitor patient  progress toward functional and medical goals  Care Tool:  Bathing    Body parts bathed by patient: Right arm, Left arm, Chest, Abdomen, Front perineal area, Right upper leg, Left upper leg, Face, Buttocks     Body parts n/a: Right lower leg, Left lower leg   Bathing assist Assist Level: Set up assist     Upper Body Dressing/Undressing Upper body dressing   What is the patient wearing?: Pull over shirt    Upper body assist Assist Level: Set up assist    Lower Body Dressing/Undressing Lower body dressing      What is the patient wearing?: Pants, Underwear/pull up     Lower body assist Assist for lower body dressing: Set up assist     Toileting Toileting    Toileting assist Assist for toileting: Contact Guard/Touching assist     Transfers Chair/bed transfer  Transfers assist     Chair/bed transfer assist level: Supervision/Verbal cueing (slide board)     Locomotion Ambulation   Ambulation assist   Ambulation activity did not occur: N/A          Walk 10 feet activity   Assist  Walk 10 feet activity did not occur: N/A        Walk 50 feet activity   Assist Walk 50 feet with 2 turns  activity did not occur: N/A         Walk 150 feet activity   Assist Walk 150 feet activity did not occur: N/A         Walk 10 feet on uneven surface  activity   Assist Walk 10 feet on uneven surfaces activity did not occur: N/A         Wheelchair     Assist Will patient use wheelchair at discharge?: Yes Type of Wheelchair: Manual    Wheelchair assist level: Supervision/Verbal cueing Max wheelchair distance: 70    Wheelchair 50 feet with 2 turns activity    Assist        Assist Level: Supervision/Verbal cueing   Wheelchair 150 feet activity     Assist  Wheelchair 150 feet activity did not occur: Refused       Blood pressure (!) 155/119, pulse 80, temperature 98.5 F (36.9 C), temperature source Oral, resp. rate 18,  weight 71.9 kg, SpO2 100 %.  Medical Problem List and Plan: 1.  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  Antithrombotics: -DVT/anticoagulation: Subcutaneous heparin             -antiplatelet therapy: Aspirin 81 mg daily and Plavix 75 mg daily 3. Pain Management: Neurontin 100 mg 3 times weekly, oxycodone as needed             -massage, edema control right residual limb     4. Mood: Zoloft 50 mg nightly             -antipsychotic agents: N/A 5. Neuropsych: This patient is capable of making decisions on her own behalf. 6. Skin/Wound Care: dry dressing, ACE to right residual limb---->shrinker as outpt 7. Fluids/Electrolytes/Nutrition: Routine in and outs with follow-up chemistries 8. ESRD.  Continue hemodialysis as directed. HD scheduled after therapies to allow max participation during the day. 9.  Acute on chronic anemia.  Follow-up CBC.  Most recent hgb 8.7 -Continue Aranesp -transfuse in HD if needed 10.  HIV: BIKTARVY 11.  Hyperlipidemia.  Lipitor 12.  Diabetes mellitus.  5 units nightly.  Check blood sugars before meals and at bedtime             -adjust regimen as needed  CBG (last 3)  Recent Labs    02/14/21 0605 02/14/21 1143 02/14/21 1233  GLUCAP 165* 56* 102*  Lantus now at 8u, cbg's improved except for low reading today before lunch--- needs to make sure eats consistently at home. Will back down to home dose of 5u to avoid excessive hypoglycemia at home.   13.  GERD.  Protonix 14.  Constipation/nausea. improved -scheduled senna bid -prn zofran for nausea   15.  HTN- As per nephrology, HD today Vitals:   02/13/21 1929 02/14/21 0415  BP: (!) 143/71 (!) 155/119  Pulse: 87 80  Resp: 18 18  Temp: 98.6 F (37 C) 98.5 F (36.9 C)  SpO2: 100%  100%     LOS: 6 days A FACE TO FACE EVALUATION WAS PERFORMED  Meredith Staggers 02/14/2021, 1:56 PM

## 2021-02-14 NOTE — Progress Notes (Signed)
Hypoglycemic Event  CBG: 56  Treatment: 4 oz juice/soda  Symptoms: None  Follow-up CBG: Time:1230 CBG Result:102  Possible Reasons for Event: Inadequate meal intake  Comments/MD notified:Notified charge nurse    Sanda Linger

## 2021-02-14 NOTE — Progress Notes (Signed)
Occupational Therapy Discharge Summary  Patient Details  Name: Kathy Frank MRN: [id number] Date of Birth: 10-Nov-1975  Today's Date: 02/14/2021 OT Individual Time: 1300-1359 OT Individual Time Calculation (min): 59 min    Patient has met 5 of 7 long term goals due to improved activity tolerance, improved balance, ability to compensate for deficits, and improved coordination.  Patient to discharge at overall Modified Independent level.  Patient's care partner unavailable to provide the necessary physical assistance at discharge.    Reasons goals not met: Patient recommended to use CGA for toilet and shower transfers for safety at d/c as this equipment can be somewhat unsteady for lateral transfers due to pt's limited balance and changes in COG due to bilateral amputations. Patient has made progress in functional mobility and ADL performance while in CIR, but could benefit from continued conditioning, strengthening, and improved endurance to increase independence in these areas.   Recommendation:  Patient will benefit from ongoing skilled OT services in home health setting to continue to advance functional skills in the area of BADL, iADL, and Reduce care partner burden.  Equipment: Transfer board, R residual limb rest for w/c  Reasons for discharge: discharge from hospital  Patient/family agrees with progress made and goals achieved: Yes  OT Discharge Precautions/Restrictions  Precautions Precautions: Fall Precaution Comments: L AKA - has prosthetic but does not use ("too heavy") Required Braces or Orthoses: Other Brace Other Brace: R limb protector, but pt states doesn't want to use 2/2 edema. Restrictions Weight Bearing Restrictions: Yes RLE Weight Bearing: Non weight bearing  Pain Pain Assessment Pain Scale: 0-10 Pain Score: 4  Pain Type: Acute pain Pain Location: Leg Pain Orientation: Right Pain Descriptors / Indicators: Aching;Burning;Sore Pain Onset: On-going Pain  Intervention(s): Repositioned;RN made aware;Rest;Emotional support Multiple Pain Sites: No ADL ADL Equipment Provided:  (long handled mirror) Eating: Independent Where Assessed-Eating: Wheelchair Grooming: Modified independent Where Assessed-Grooming: Wheelchair, Sitting at sink Upper Body Bathing: Setup (pt reports she will be taking sponge baths in bed at home) Where Assessed-Upper Body Bathing: Edge of bed Lower Body Bathing: Setup Where Assessed-Lower Body Bathing: Bed level Upper Body Dressing: Modified independent (Device) Where Assessed-Upper Body Dressing: Wheelchair Lower Body Dressing: Modified independent Where Assessed-Lower Body Dressing: Wheelchair, Bed level Toileting: Contact guard Where Assessed-Toileting: Bedside Commode Toilet Transfer: Therapist, music Method: Teacher, adult education, Other (comment) (lateral transfer) Toilet Transfer Equipment: Bedside commode (to simulate home BSC) Tub/Shower Transfer: Metallurgist Method: Administrator, arts: Radio broadcast assistant (lateral transfer) Social research officer, government: Curator Method: Teacher, adult education (lateral transfer) Youth worker: Radio broadcast assistant ADL Comments: Tub/shower and BSC transfers requiring CGA at d/c recommended for safety. Vision Baseline Vision/History: Retinopathy Patient Visual Report: No change from baseline Vision Assessment?: No apparent visual deficits Perception  Perception: Within Functional Limits Praxis Praxis: Intact Cognition Overall Cognitive Status: Within Functional Limits for tasks assessed Arousal/Alertness: Awake/alert Orientation Level: Oriented X4 Attention: Selective Memory: Appears intact Problem Solving: Appears intact Safety/Judgment: Appears intact Sensation Sensation Light Touch: Appears Intact Hot/Cold: Appears Intact Proprioception: Appears Intact Stereognosis: Appears  Intact Additional Comments: slight decrease in light touch on medial distal aspect of R residual limb Coordination Gross Motor Movements are Fluid and Coordinated: No Fine Motor Movements are Fluid and Coordinated: Yes Coordination and Movement Description: limited by bilateral LE amputations and generalized weakness Motor  Motor Motor: Within Functional Limits Motor - Discharge Observations: improved from eval, but still with some core and LE weakness Mobility  Bed Mobility Bed Mobility: Sit to Supine;Supine to Sit;Sitting - Scoot to [name] of Bed;Scooting to [facility] Supine to Sit: Independent with assistive device Sitting - Scoot to Edge of Bed: Independent with assistive device Scooting to [facility]: Independent with assistive device  Trunk/Postural Assessment  Cervical Assessment Cervical Assessment: Within Functional Limits Thoracic Assessment Thoracic Assessment: Exceptions to [facility] (rounded shoulders) Lumbar Assessment Lumbar Assessment: Within Functional Limits Postural Control Postural Control: Within Functional Limits Trunk Control: compensating for changes in COG following amputation  Balance Balance Balance Assessed: Yes Static Sitting Balance Static Sitting - Balance Support: No upper extremity supported;Right upper extremity supported Static Sitting - Level of Assistance: 6: Modified independent (Device/Increase time) Dynamic Sitting Balance Dynamic Sitting - Balance Support: Bilateral upper extremity supported;During functional activity;Right upper extremity supported;Left upper extremity supported;No upper extremity supported Dynamic Sitting - Level of Assistance: 6: Modified independent (Device/Increase time) Dynamic Sitting - Balance Activities: Lateral lean/weight shifting;Forward lean/weight shifting;Reaching for objects;Ball toss;Trunk control activities Extremity/Trunk Assessment RUE Assessment RUE Assessment: Within Functional Limits LUE Assessment LUE Assessment:  Within Functional Limits  Skilled Therapeutic Intervention: Pt received supine in bed, RN present with pt requesting pain meds due to pain in L residual limb, agreeable to OT. Session focused on residual limb care, home safety/set up, DME recommendations and functional transfer practice, and dynamic sitting balance tasks to improve independence and safety in BADLs. Pt practiced ace wrapping with min vc's, refusing to take off bottom ace wrap as MD had wrapped this morning and pt felt like he did it best. Per RN and PT, MD informed pt to ace wrap for now and not use shrinker sock on L residual limb; discussed with pt to discuss with MD if any questions/concerns. Pt asking questions regarding gauze type for incision; pt stated she will ask RN. Pt donned limb protector spvsn seated in long sitting in bed. Posterior transfer bed>w/c mod I, pt retrieving and managing limb rest for w/c mod I. Pt self-propelled ~200' to kitchen; engaged in simple meal prep activity retriving items from multiple levels outside BOS from w/c; education on energy conservation and home set up strategies. Lateral transfer w/c<>TTB CGA for safety as pt was limited by pain in R residual limb. Lateral transfer w/c<>BSC (not drop arm) to simulate home [facility] with pt reporting she liked this transfer better than using slide board for improved safety to non-drop arm BSC. Pt engaged in dynamic seated reaching/balance task using BITS, no LOB when reaching far outside BOS in all directions. Pt returned to room, anterior transfer w/c>bed mod I. Pt remained supine in bed, call bell in hand, all immediate needs met; provided with BUE HEP handout for use of therabands for conditioning/strengthening.   Kathy Frank Lon Klippel 02/14/2021, 2:01 PM

## 2021-02-14 NOTE — Plan of Care (Signed)
  Problem: RH BOWEL ELIMINATION Goal: RH STG MANAGE BOWEL WITH ASSISTANCE Description: STG Manage Bowel with Supervision. Outcome: Progressing Goal: RH STG MANAGE BOWEL W/MEDICATION W/ASSISTANCE Description: STG Manage Bowel with Medication with mod I assist Outcome: Progressing   Problem: RH SKIN INTEGRITY Goal: RH STG SKIN FREE OF INFECTION/BREAKDOWN Description: With min assist Outcome: Progressing Goal: RH STG MAINTAIN SKIN INTEGRITY WITH ASSISTANCE Description: STG Maintain Skin Integrity With min Assistance. Outcome: Progressing Goal: RH STG ABLE TO PERFORM INCISION/WOUND CARE W/ASSISTANCE Description: STG Able To Perform Incision/Wound Care With [employer]. Outcome: Progressing   Problem: RH SAFETY Goal: RH STG ADHERE TO SAFETY PRECAUTIONS W/ASSISTANCE/DEVICE Description: STG Adhere to Safety Precautions With cues/reminders Assistance/Device. Outcome: Progressing   Problem: RH PAIN MANAGEMENT Goal: RH STG PAIN MANAGED AT OR BELOW PT'S PAIN GOAL Description: At or below level 4 Outcome: Progressing   Problem: RH KNOWLEDGE DEFICIT LIMB LOSS Goal: RH STG INCREASE KNOWLEDGE OF SELF CARE AFTER LIMB LOSS Description: Patient will be able to manage care at discharge using handouts and educational tools independently Outcome: Progressing

## 2021-02-14 NOTE — Plan of Care (Signed)
  Problem: RH Bathing Goal: LTG Patient will bathe all body parts with assist levels (OT) Description: LTG: Patient will bathe all body parts with assist levels (OT) Outcome: Adequate for Discharge Note: Pt reports she plans to have assistance from mom/dtr to have set up A for sponge baths bed level at d/c.   Problem: RH Toileting Goal: LTG Patient will perform toileting task (3/3 steps) with assistance level (OT) Description: LTG: Patient will perform toileting task (3/3 steps) with assistance level (OT)  Outcome: Not Met (add Reason) Note: OT recommending pt have CGA for toileting at [facility] for safety due to decreased balance/maintaining COG with bilateral amputations.    Problem: RH Toilet Transfers Goal: LTG Patient will perform toilet transfers w/assist (OT) Description: LTG: Patient will perform toilet transfers with assist, with/without cues using equipment (OT) Outcome: Not Met (add Reason) Note: Recommended pt have CGA/close spvsn for toilet transfers at d/c for safety due to decreased balance/endurance.   Problem: RH Tub/Shower Transfers Goal: LTG Patient will perform tub/shower transfers w/assist (OT) Description: LTG: Patient will perform tub/shower transfers with assist, with/without cues using equipment (OT) Outcome: Not Applicable Note: Recommended pt have CGA/close spvsn for shower transfers to TTB at d/c for safety due to decreased balance/endurance. However, goal not applicable at d/c as pt plans to sponge bath until incision healed and improved strengthening.

## 2021-02-14 NOTE — Progress Notes (Signed)
Inpatient Rehabilitation Care Coordinator Discharge Note  The overall goal for the admission was met for: DC SAT 7/9  Discharge location: Yes-MOMS' HOME SINCE CAN BE THERE WITH HER  Length of Stay: Yes- 7 DAYS  Discharge activity level: Yes-MOD/I TRANSFERS-WHEELCHAIR LEVEL  Home/community participation: Yes  Services provided included: MD, RD, PT, OT, RN, CM, Pharmacy, and SW  Financial Services: Medicaid and Private Insurance: [employer]-MEDICARE  Choices offered to/list presented to:PT  Follow-up services arranged: Home Health: [facility] & O, DME: [facility], R-AMPUTEE PAD, TRANSFER BOARD. HAS GOTTEN BSC IN 2020 SO WOULD BE PRIVATE PAY FOR DROP-ARM BSC AND HAS TUB SEAT, and Patient/Family has no preference for HH/DME agencies PT AWARE IF WANTS DROP-ARM BSC PRIVATE PAY. ADDED HHRN FOR WOUND  Comments (or additional information):PT DID WELL AND GOT MOD/I WITH TRANSFERS GOING TO MOM'S HOME AT DISCHARGE DUE TO SHE WILL BE THERE WITH HER. DAUGHTER TRANSPORTS TO HD  Patient/Family verbalized understanding of follow-up arrangements: Yes  Individual responsible for coordination of the follow-up plan: DEMETRIS-MOM [phone]  Confirmed correct DME delivered: ,  G 02/14/2021    ,  G 

## 2021-02-15 DIAGNOSIS — N186 End stage renal disease: Secondary | ICD-10-CM | POA: Diagnosis not present

## 2021-02-15 DIAGNOSIS — I96 Gangrene, not elsewhere classified: Secondary | ICD-10-CM | POA: Diagnosis not present

## 2021-02-15 DIAGNOSIS — Z992 Dependence on renal dialysis: Secondary | ICD-10-CM | POA: Diagnosis not present

## 2021-02-15 DIAGNOSIS — I120 Hypertensive chronic kidney disease with stage 5 chronic kidney disease or end stage renal disease: Secondary | ICD-10-CM | POA: Diagnosis not present

## 2021-02-15 DIAGNOSIS — N2581 Secondary hyperparathyroidism of renal origin: Secondary | ICD-10-CM | POA: Diagnosis not present

## 2021-02-15 LAB — RENAL FUNCTION PANEL
Albumin: 2.4 g/dL — ABNORMAL LOW (ref 3.5–5.0)
Anion gap: 11 (ref 5–15)
BUN: 21 mg/dL — ABNORMAL HIGH (ref 6–20)
CO2: 27 mmol/L (ref 22–32)
Calcium: 9.5 mg/dL (ref 8.9–10.3)
Chloride: 94 mmol/L — ABNORMAL LOW (ref 98–111)
Creatinine, Ser: 7.17 mg/dL — ABNORMAL HIGH (ref 0.44–1.00)
GFR, Estimated: 7 mL/min — ABNORMAL LOW (ref >60)
Glucose, Bld: 153 mg/dL — ABNORMAL HIGH (ref 70–99)
Phosphorus: 3.6 mg/dL (ref 2.5–4.6)
Potassium: 4 mmol/L (ref 3.5–5.1)
Sodium: 132 mmol/L — ABNORMAL LOW (ref 135–145)

## 2021-02-15 LAB — CBC
HCT: 24.9 % — ABNORMAL LOW (ref 36.0–46.0)
Hemoglobin: 7.7 g/dL — ABNORMAL LOW (ref 12.0–15.0)
MCH: 29.8 pg (ref 26.0–34.0)
MCHC: 30.9 g/dL (ref 30.0–36.0)
MCV: 96.5 fL (ref 80.0–100.0)
Platelets: 429 10*3/uL — ABNORMAL HIGH (ref 150–400)
RBC: 2.58 MIL/uL — ABNORMAL LOW (ref 3.87–5.11)
RDW: 15.9 % — ABNORMAL HIGH (ref 11.5–15.5)
WBC: 7.1 10*3/uL (ref 4.0–10.5)
nRBC: 0 % (ref 0.0–0.2)

## 2021-02-15 LAB — GLUCOSE, CAPILLARY
Glucose-Capillary: 158 mg/dL — ABNORMAL HIGH (ref 70–99)
Glucose-Capillary: 118 mg/dL — ABNORMAL HIGH (ref 70–99)

## 2021-02-15 NOTE — Plan of Care (Signed)
  Problem: RH BOWEL ELIMINATION Goal: RH STG MANAGE BOWEL WITH ASSISTANCE Description: STG Manage Bowel with Supervision. 02/15/2021 1557 by Renda Rolls L, LPN Outcome: Completed/Met 02/15/2021 1556 by Renda Rolls L, LPN Outcome: Progressing Goal: RH STG MANAGE BOWEL W/MEDICATION W/ASSISTANCE Description: STG Manage Bowel with Medication with mod I assist 02/15/2021 1557 by Renda Rolls L, LPN Outcome: Completed/Met 02/15/2021 1556 by Renda Rolls L, LPN Outcome: Progressing   Problem: RH SKIN INTEGRITY Goal: RH STG SKIN FREE OF INFECTION/BREAKDOWN Description: With min assist 02/15/2021 1557 by Renda Rolls L, LPN Outcome: Completed/Met 02/15/2021 1556 by Dayten Juba L, LPN Outcome: Progressing Goal: RH STG MAINTAIN SKIN INTEGRITY WITH ASSISTANCE Description: STG Maintain Skin Integrity With min Assistance. 02/15/2021 1557 by Renda Rolls L, LPN Outcome: Completed/Met 02/15/2021 1556 by Renda Rolls L, LPN Outcome: Progressing Goal: RH STG ABLE TO PERFORM INCISION/WOUND CARE W/ASSISTANCE Description: STG Able To Perform Incision/Wound Care With [facility]. 02/15/2021 1557 by Renda Rolls L, LPN Outcome: Completed/Met 02/15/2021 1556 by Renda Rolls L, LPN Outcome: Progressing   Problem: RH SAFETY Goal: RH STG ADHERE TO SAFETY PRECAUTIONS W/ASSISTANCE/DEVICE Description: STG Adhere to Safety Precautions With cues/reminders Assistance/Device. 02/15/2021 1557 by Renda Rolls L, LPN Outcome: Completed/Met 02/15/2021 1556 by Renda Rolls L, LPN Outcome: Progressing   Problem: RH PAIN MANAGEMENT Goal: RH STG PAIN MANAGED AT OR BELOW PT'S PAIN GOAL Description: At or below level 4 02/15/2021 1557 by Sheldon Sem L, LPN Outcome: Completed/Met 02/15/2021 1556 by Danylle Ouk L, LPN Outcome: Progressing   Problem: RH KNOWLEDGE DEFICIT LIMB LOSS Goal: RH STG INCREASE KNOWLEDGE OF SELF CARE AFTER LIMB LOSS Description: Patient will be able to manage care at discharge using handouts and  educational tools independently 02/15/2021 1557 by Renda Rolls L, LPN Outcome: Completed/Met 02/15/2021 1556 by Renda Rolls L, LPN Outcome: Progressing

## 2021-02-15 NOTE — Plan of Care (Signed)
  Problem: RH BOWEL ELIMINATION Goal: RH STG MANAGE BOWEL WITH ASSISTANCE Description: STG Manage Bowel with Supervision. Outcome: Progressing Goal: RH STG MANAGE BOWEL W/MEDICATION W/ASSISTANCE Description: STG Manage Bowel with Medication with mod I assist Outcome: Progressing   Problem: RH SKIN INTEGRITY Goal: RH STG SKIN FREE OF INFECTION/BREAKDOWN Description: With min assist Outcome: Progressing Goal: RH STG MAINTAIN SKIN INTEGRITY WITH ASSISTANCE Description: STG Maintain Skin Integrity With min Assistance. Outcome: Progressing Goal: RH STG ABLE TO PERFORM INCISION/WOUND CARE W/ASSISTANCE Description: STG Able To Perform Incision/Wound Care With [employer]. Outcome: Progressing   Problem: RH SAFETY Goal: RH STG ADHERE TO SAFETY PRECAUTIONS W/ASSISTANCE/DEVICE Description: STG Adhere to Safety Precautions With cues/reminders Assistance/Device. Outcome: Progressing   Problem: RH PAIN MANAGEMENT Goal: RH STG PAIN MANAGED AT OR BELOW PT'S PAIN GOAL Description: At or below level 4 Outcome: Progressing   Problem: RH KNOWLEDGE DEFICIT LIMB LOSS Goal: RH STG INCREASE KNOWLEDGE OF SELF CARE AFTER LIMB LOSS Description: Patient will be able to manage care at discharge using handouts and educational tools independently Outcome: Progressing

## 2021-02-15 NOTE — Progress Notes (Signed)
[facility] Progress Note   Subjective:    Seen and examined on dialysis.  Blood pressure 116/57 and HR 71 on HD. Procedure supervised.  Tolerating goal.  Left AVF in use.  States going home today she thinks   Review of systems:  Denies shortness of breath Denies n/v Denies chest pain   Objective Vitals:   02/15/21 0747 02/15/21 0800 02/15/21 0830 02/15/21 0900  BP: (!) 159/73 133/63 118/60 (!) 109/55  Pulse: 73 73 72 72  Resp: 18     Temp:      TempSrc:      SpO2:      Weight:       Physical Exam  General adult female in bed in no acute distress HEENT normocephalic atraumatic extraocular movements intact sclera anicteric. Some periorbital edema Neck supple trachea midline Lungs clear to auscultation bilaterally normal work of breathing at rest room air Heart S1S2 no rub Abdomen soft nontender nondistended Extremities no edema residual limbs; right BKA, left AKA Psych normal mood and affect Neuro - alert and oriented x 3 provides hx and follows commands HD access: AVF left arm in use   Filed Weights   02/13/21 1400 02/14/21 0500 02/15/21 0730  Weight: 71.9 kg 71.9 kg 69.5 kg    Intake/Output Summary (Last 24 hours) at 02/15/2021 0915 Last data filed at 02/15/2021 0730 Gross per 24 hour  Intake 820 ml  Output --  Net 820 ml     Additional Objective Labs: Basic Metabolic Panel: Recent Labs  Lab 02/11/21 1151 02/14/21 1151 02/15/21 0728  NA 128* 133* 132*  K 4.2 3.9 4.0  CL 88* 94* 94*  CO2 '27 27 27  '$ GLUCOSE 112* 59* 153*  BUN 33* 14 21*  CREATININE 9.47* 5.60* 7.17*  CALCIUM 10.8* 9.4 9.5  PHOS 3.5 2.6 3.6   Liver Function Tests: Recent Labs  Lab 02/11/21 1151 02/14/21 1151 02/15/21 0728  ALBUMIN 2.4* 2.8* 2.4*    CBC: Recent Labs  Lab 02/08/21 1439 02/11/21 1151 02/15/21 0728  WBC 9.4 10.4 7.1  NEUTROABS  --  7.7  --   HGB 7.6* 8.7* 7.7*  HCT 24.2* 26.6* 24.9*  MCV 94.2 91.7 96.5  PLT 458* 574* 429*   Blood  Culture    Component Value Date/Time   SDES BLOOD RIGHT ANTECUBITAL 01/28/2021 1812   SPECREQUEST  01/28/2021 1812    BOTTLES DRAWN AEROBIC AND ANAEROBIC Blood Culture adequate volume   CULT  01/28/2021 1812    NO GROWTH 5 DAYS Performed at [facility], [place] [address], [place], [place] [address]    REPTSTATUS 02/02/2021 FINAL 01/28/2021 1812    CBG: Recent Labs  Lab 02/14/21 1143 02/14/21 1233 02/14/21 1700 02/14/21 2101 02/15/21 0553  GLUCAP 56* 102* 158* 129* 158*    Medications:  iron sucrose Stopped (02/13/21 1641)    amLODipine  5 mg Oral Daily   aspirin EC  81 mg Oral Daily   atorvastatin  40 mg Oral Daily   bictegravir-emtricitabine-tenofovir AF  1 tablet Oral Daily   bisacodyl  5 mg Oral Daily   Chlorhexidine Gluconate Cloth  6 each Topical Q0600   clopidogrel  75 mg Oral Daily   darbepoetin (ARANESP) injection - DIALYSIS  100 mcg Intravenous Q Tue-HD   feeding supplement (NEPRO CARB STEADY)  237 mL Oral BID BM   gabapentin  100 mg Oral Once per day on Tue Thu Sat   heparin  5,000 Units Subcutaneous Q8H  insulin aspart  0-15 Units Subcutaneous TID WC   insulin glargine  5 Units Subcutaneous QHS   multivitamin  1 tablet Oral QHS   pantoprazole  40 mg Oral Daily   polyethylene glycol  17 g Oral BID   senna  2 tablet Oral BID   sertraline  50 mg Oral QHS    Dialysis Orders: [facility]-TTS 4h   75 kg    400/1.5    2/ 2.0 bath  AVF Hep 3000 -Calcitriol 1.25 mcg p.o. q. 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 ESRD: HD TTS schedule holding heparin due to oozing from BKA.  Hypertension/volume: Under EDW, expected post BKA, lower dry on d/c ~69-70kg.  Continue to challenge dry weight as able.  Added back amlodipine 5 mg daily on 7/4 Anemia of ESRD: has been on Aranesp 176mg every Tuesday - assess dose  increase for ESA.  S/p Venofer Secondary Hyperparathyroidism: note phoslo held. Follow renal panel.  Calcium high at 10.8 w/o correcting for albumin - held calcitriol for now. On 2 ca bath Nutrition: Albumin low related to acute chronic illness.  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 To be discharged today per charting  LClaudia Desanctis MD [place]7/04/2021,9:25 AM  LOS: 7 days

## 2021-02-15 NOTE — Progress Notes (Signed)
Called to patient's room to assess abdominal bleeding from RLQ. Pt was cleaned and dressing applied. MD notified by secure chat and heparin held per pt request until seen by MD

## 2021-02-16 NOTE — TOC Transition Note (Signed)
Transition of care contact from inpatient facility  Date of discharge: 02/15/21 Date of contact: 02/16/21 Method: Phone Spoke to: Patient  Patient contacted to discuss transition of care from recent inpatient hospitalization. 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  Medication changes were reviewed. Patient reports needing a new Accu Check Meter-Rx sent to her local pharmacy. Otherwise, she reports having all her medications.  Patient will follow up with her outpatient HD unit on: Tuesday 02/18/21 at [facility].  Tobie Poet, NP

## 2021-02-17 ENCOUNTER — Other Ambulatory Visit: Payer: Self-pay | Admitting: Internal Medicine

## 2021-02-17 ENCOUNTER — Other Ambulatory Visit (HOSPITAL_COMMUNITY): Payer: Self-pay

## 2021-02-17 DIAGNOSIS — B20 Human immunodeficiency virus [HIV] disease: Secondary | ICD-10-CM

## 2021-02-17 MED ORDER — BIKTARVY 50-200-25 MG PO TABS
1.0000 | ORAL_TABLET | Freq: Every day | ORAL | 0 refills | Status: DC
  Filled 2021-02-17 – 2021-04-01 (×2): qty 30, 30d supply, fill #0

## 2021-02-18 DIAGNOSIS — R519 Headache, unspecified: Secondary | ICD-10-CM | POA: Diagnosis not present

## 2021-02-18 DIAGNOSIS — L299 Pruritus, unspecified: Secondary | ICD-10-CM | POA: Diagnosis not present

## 2021-02-18 DIAGNOSIS — N2581 Secondary hyperparathyroidism of renal origin: Secondary | ICD-10-CM | POA: Diagnosis not present

## 2021-02-18 DIAGNOSIS — D631 Anemia in chronic kidney disease: Secondary | ICD-10-CM | POA: Diagnosis not present

## 2021-02-18 DIAGNOSIS — D509 Iron deficiency anemia, unspecified: Secondary | ICD-10-CM | POA: Diagnosis not present

## 2021-02-18 DIAGNOSIS — L97509 Non-pressure chronic ulcer of other part of unspecified foot with unspecified severity: Secondary | ICD-10-CM | POA: Diagnosis not present

## 2021-02-18 DIAGNOSIS — Z992 Dependence on renal dialysis: Secondary | ICD-10-CM | POA: Diagnosis not present

## 2021-02-18 DIAGNOSIS — N186 End stage renal disease: Secondary | ICD-10-CM | POA: Diagnosis not present

## 2021-02-18 DIAGNOSIS — I96 Gangrene, not elsewhere classified: Secondary | ICD-10-CM | POA: Diagnosis not present

## 2021-02-18 DIAGNOSIS — D689 Coagulation defect, unspecified: Secondary | ICD-10-CM | POA: Diagnosis not present

## 2021-02-18 DIAGNOSIS — E1110 Type 2 diabetes mellitus with ketoacidosis without coma: Secondary | ICD-10-CM | POA: Diagnosis not present

## 2021-02-20 ENCOUNTER — Other Ambulatory Visit (HOSPITAL_COMMUNITY): Payer: Self-pay

## 2021-02-20 DIAGNOSIS — R519 Headache, unspecified: Secondary | ICD-10-CM | POA: Diagnosis not present

## 2021-02-20 DIAGNOSIS — N186 End stage renal disease: Secondary | ICD-10-CM | POA: Diagnosis not present

## 2021-02-20 DIAGNOSIS — E1110 Type 2 diabetes mellitus with ketoacidosis without coma: Secondary | ICD-10-CM | POA: Diagnosis not present

## 2021-02-20 DIAGNOSIS — L299 Pruritus, unspecified: Secondary | ICD-10-CM | POA: Diagnosis not present

## 2021-02-20 DIAGNOSIS — D631 Anemia in chronic kidney disease: Secondary | ICD-10-CM | POA: Diagnosis not present

## 2021-02-20 DIAGNOSIS — D689 Coagulation defect, unspecified: Secondary | ICD-10-CM | POA: Diagnosis not present

## 2021-02-20 DIAGNOSIS — D509 Iron deficiency anemia, unspecified: Secondary | ICD-10-CM | POA: Diagnosis not present

## 2021-02-20 DIAGNOSIS — Z992 Dependence on renal dialysis: Secondary | ICD-10-CM | POA: Diagnosis not present

## 2021-02-20 DIAGNOSIS — N2581 Secondary hyperparathyroidism of renal origin: Secondary | ICD-10-CM | POA: Diagnosis not present

## 2021-02-25 ENCOUNTER — Encounter: Payer: Medicare Other | Attending: Registered Nurse | Admitting: Registered Nurse

## 2021-02-25 DIAGNOSIS — Z992 Dependence on renal dialysis: Secondary | ICD-10-CM | POA: Diagnosis not present

## 2021-02-25 DIAGNOSIS — L299 Pruritus, unspecified: Secondary | ICD-10-CM | POA: Diagnosis not present

## 2021-02-25 DIAGNOSIS — N2581 Secondary hyperparathyroidism of renal origin: Secondary | ICD-10-CM | POA: Diagnosis not present

## 2021-02-25 DIAGNOSIS — R519 Headache, unspecified: Secondary | ICD-10-CM | POA: Diagnosis not present

## 2021-02-25 DIAGNOSIS — E1110 Type 2 diabetes mellitus with ketoacidosis without coma: Secondary | ICD-10-CM | POA: Diagnosis not present

## 2021-02-25 DIAGNOSIS — D631 Anemia in chronic kidney disease: Secondary | ICD-10-CM | POA: Diagnosis not present

## 2021-02-25 DIAGNOSIS — N186 End stage renal disease: Secondary | ICD-10-CM | POA: Diagnosis not present

## 2021-02-25 DIAGNOSIS — D689 Coagulation defect, unspecified: Secondary | ICD-10-CM | POA: Diagnosis not present

## 2021-02-25 DIAGNOSIS — D509 Iron deficiency anemia, unspecified: Secondary | ICD-10-CM | POA: Diagnosis not present

## 2021-02-26 ENCOUNTER — Other Ambulatory Visit (HOSPITAL_COMMUNITY): Payer: Self-pay

## 2021-02-27 DIAGNOSIS — Z992 Dependence on renal dialysis: Secondary | ICD-10-CM | POA: Diagnosis not present

## 2021-02-27 DIAGNOSIS — D509 Iron deficiency anemia, unspecified: Secondary | ICD-10-CM | POA: Diagnosis not present

## 2021-02-27 DIAGNOSIS — E1110 Type 2 diabetes mellitus with ketoacidosis without coma: Secondary | ICD-10-CM | POA: Diagnosis not present

## 2021-02-27 DIAGNOSIS — R519 Headache, unspecified: Secondary | ICD-10-CM | POA: Diagnosis not present

## 2021-02-27 DIAGNOSIS — N186 End stage renal disease: Secondary | ICD-10-CM | POA: Diagnosis not present

## 2021-02-27 DIAGNOSIS — L299 Pruritus, unspecified: Secondary | ICD-10-CM | POA: Diagnosis not present

## 2021-02-27 DIAGNOSIS — D631 Anemia in chronic kidney disease: Secondary | ICD-10-CM | POA: Diagnosis not present

## 2021-02-27 DIAGNOSIS — N2581 Secondary hyperparathyroidism of renal origin: Secondary | ICD-10-CM | POA: Diagnosis not present

## 2021-02-27 DIAGNOSIS — D689 Coagulation defect, unspecified: Secondary | ICD-10-CM | POA: Diagnosis not present

## 2021-03-01 DIAGNOSIS — Z992 Dependence on renal dialysis: Secondary | ICD-10-CM | POA: Diagnosis not present

## 2021-03-01 DIAGNOSIS — N186 End stage renal disease: Secondary | ICD-10-CM | POA: Diagnosis not present

## 2021-03-01 DIAGNOSIS — L299 Pruritus, unspecified: Secondary | ICD-10-CM | POA: Diagnosis not present

## 2021-03-01 DIAGNOSIS — E1110 Type 2 diabetes mellitus with ketoacidosis without coma: Secondary | ICD-10-CM | POA: Diagnosis not present

## 2021-03-01 DIAGNOSIS — D509 Iron deficiency anemia, unspecified: Secondary | ICD-10-CM | POA: Diagnosis not present

## 2021-03-01 DIAGNOSIS — D689 Coagulation defect, unspecified: Secondary | ICD-10-CM | POA: Diagnosis not present

## 2021-03-01 DIAGNOSIS — N2581 Secondary hyperparathyroidism of renal origin: Secondary | ICD-10-CM | POA: Diagnosis not present

## 2021-03-01 DIAGNOSIS — D631 Anemia in chronic kidney disease: Secondary | ICD-10-CM | POA: Diagnosis not present

## 2021-03-01 DIAGNOSIS — R519 Headache, unspecified: Secondary | ICD-10-CM | POA: Diagnosis not present

## 2021-03-04 DIAGNOSIS — D631 Anemia in chronic kidney disease: Secondary | ICD-10-CM | POA: Diagnosis not present

## 2021-03-04 DIAGNOSIS — D509 Iron deficiency anemia, unspecified: Secondary | ICD-10-CM | POA: Diagnosis not present

## 2021-03-04 DIAGNOSIS — L299 Pruritus, unspecified: Secondary | ICD-10-CM | POA: Diagnosis not present

## 2021-03-04 DIAGNOSIS — Z992 Dependence on renal dialysis: Secondary | ICD-10-CM | POA: Diagnosis not present

## 2021-03-04 DIAGNOSIS — E1110 Type 2 diabetes mellitus with ketoacidosis without coma: Secondary | ICD-10-CM | POA: Diagnosis not present

## 2021-03-04 DIAGNOSIS — D689 Coagulation defect, unspecified: Secondary | ICD-10-CM | POA: Diagnosis not present

## 2021-03-04 DIAGNOSIS — N2581 Secondary hyperparathyroidism of renal origin: Secondary | ICD-10-CM | POA: Diagnosis not present

## 2021-03-04 DIAGNOSIS — N186 End stage renal disease: Secondary | ICD-10-CM | POA: Diagnosis not present

## 2021-03-04 DIAGNOSIS — R519 Headache, unspecified: Secondary | ICD-10-CM | POA: Diagnosis not present

## 2021-03-08 DIAGNOSIS — D509 Iron deficiency anemia, unspecified: Secondary | ICD-10-CM | POA: Diagnosis not present

## 2021-03-08 DIAGNOSIS — D631 Anemia in chronic kidney disease: Secondary | ICD-10-CM | POA: Diagnosis not present

## 2021-03-08 DIAGNOSIS — D689 Coagulation defect, unspecified: Secondary | ICD-10-CM | POA: Diagnosis not present

## 2021-03-08 DIAGNOSIS — Z992 Dependence on renal dialysis: Secondary | ICD-10-CM | POA: Diagnosis not present

## 2021-03-08 DIAGNOSIS — L299 Pruritus, unspecified: Secondary | ICD-10-CM | POA: Diagnosis not present

## 2021-03-08 DIAGNOSIS — E1110 Type 2 diabetes mellitus with ketoacidosis without coma: Secondary | ICD-10-CM | POA: Diagnosis not present

## 2021-03-08 DIAGNOSIS — R519 Headache, unspecified: Secondary | ICD-10-CM | POA: Diagnosis not present

## 2021-03-08 DIAGNOSIS — N2581 Secondary hyperparathyroidism of renal origin: Secondary | ICD-10-CM | POA: Diagnosis not present

## 2021-03-08 DIAGNOSIS — N186 End stage renal disease: Secondary | ICD-10-CM | POA: Diagnosis not present

## 2021-03-09 DIAGNOSIS — E1129 Type 2 diabetes mellitus with other diabetic kidney complication: Secondary | ICD-10-CM | POA: Diagnosis not present

## 2021-03-09 DIAGNOSIS — Z992 Dependence on renal dialysis: Secondary | ICD-10-CM | POA: Diagnosis not present

## 2021-03-09 DIAGNOSIS — N186 End stage renal disease: Secondary | ICD-10-CM | POA: Diagnosis not present

## 2021-03-10 ENCOUNTER — Other Ambulatory Visit: Payer: Self-pay

## 2021-03-10 ENCOUNTER — Ambulatory Visit (INDEPENDENT_AMBULATORY_CARE_PROVIDER_SITE_OTHER): Payer: Medicare Other | Admitting: Physician Assistant

## 2021-03-10 VITALS — BP 186/89 | HR 88 | Temp 97.2°F

## 2021-03-10 DIAGNOSIS — N186 End stage renal disease: Secondary | ICD-10-CM | POA: Diagnosis not present

## 2021-03-10 DIAGNOSIS — E1129 Type 2 diabetes mellitus with other diabetic kidney complication: Secondary | ICD-10-CM | POA: Diagnosis not present

## 2021-03-10 DIAGNOSIS — I739 Peripheral vascular disease, unspecified: Secondary | ICD-10-CM

## 2021-03-10 DIAGNOSIS — Z992 Dependence on renal dialysis: Secondary | ICD-10-CM | POA: Diagnosis not present

## 2021-03-10 NOTE — Progress Notes (Signed)
POST OPERATIVE OFFICE NOTE    CC:  F/u for surgery  HPI:  This is a 45 y.o. female who is s/p right BKA on 01/31/2021. Requesting additional pain medication. She denies fever or chill. She ambulates via WC.   No Known Allergies  Current Outpatient Medications  Medication Sig Dispense Refill   acetaminophen (TYLENOL) 325 MG tablet Take 2 tablets (650 mg total) by mouth every 6 (six) hours as needed for mild pain (or Fever >/= 101).     albuterol (VENTOLIN HFA) 108 (90 Base) MCG/ACT inhaler Inhale 2 puffs into the lungs every 4 (four) hours as needed for wheezing or shortness of breath. 8.5 g 0   amLODipine (NORVASC) 5 MG tablet Take 1 tablet (5 mg total) by mouth daily. 30 tablet 2   aspirin EC 81 MG tablet Take 1 tablet (81 mg total) by mouth daily. Swallow whole. 90 tablet 3   atorvastatin (LIPITOR) 40 MG tablet Take 1 tablet (40 mg total) by mouth daily. 90 tablet 3   bictegravir-emtricitabine-tenofovir AF (BIKTARVY) 50-200-25 MG TABS tablet TAKE 1 TABLET BY MOUTH DAILY. 30 tablet 0   calcitRIOL (ROCALTROL) 0.25 MCG capsule Take 5 capsules (1.25 mcg total) by mouth daily. 30 capsule 0   calcium acetate (PHOSLO) 667 MG capsule Take 3 capsules (2,001 mg total) by mouth 3 (three) times daily with meals. 180 capsule 0   clopidogrel (PLAVIX) 75 MG tablet Take 1 tablet (75 mg total) by mouth daily. 90 tablet 3   gabapentin (NEURONTIN) 100 MG capsule Take 1 capsule (100 mg total) by mouth 3 (three) times a week. 90 capsule 0   insulin glargine (LANTUS SOLOSTAR) 100 UNIT/ML Solostar Pen Inject 8 Units into the skin at bedtime. 15 mL 11   Insulin Pen Needle (PENTIPS) 32G X 4 MM MISC Use as directed 100 each 3   multivitamin (RENA-VIT) TABS tablet Take 1 tablet by mouth daily. 30 tablet 0   pantoprazole (PROTONIX) 40 MG tablet Take 1 tablet (40 mg total) by mouth daily. 30 tablet 0   polyethylene glycol (MIRALAX / GLYCOLAX) 17 g packet Take 17 g by mouth 2 (two) times daily. 14 each 0    sertraline (ZOLOFT) 50 MG tablet Take 1 tablet (50 mg total) by mouth at bedtime. 30 tablet 0   oxyCODONE (OXY IR/ROXICODONE) 5 MG immediate release tablet Take 1 tablet (5 mg total) by mouth every 4 (four) hours as needed for moderate pain. (Patient not taking: Reported on 03/10/2021) 30 tablet 0   No current facility-administered medications for this visit.     ROS:  See HPI  BP (!) 186/89 (BP Location: Right Arm, Patient Position: Sitting, Cuff Size: Normal)   Pulse 88   Temp (!) 97.2 F (36.2 C) (Temporal)   LMP  (LMP Unknown) Comment: Pt. Cannot remember  SpO2 100%   Physical Exam:  General appearance: Awake, alert in no apparent distress Cardiac: Heart rate and rhythm are regular Respirations: Nonlabored Extremities: RLE: Amputation site incision with eschar a skin edges medially and fibrinous exudate at the center portion of her incision. Mild edema. No drainage. Anterior and posterior flaps are warm and well-perfused     Assessment/Plan:  This is a 45 y.o. female who is s/p:right BKA.  I explained I could not refill narcotic medication but she could discuss increasing her gabapentin with her primary care physician.  Will not remove any staples today.  Recommend Betadine paint to incision daily.  Recheck incision in 2 weeks.  I advised her to call us sooner should she develop skin edge separation, drainage, fever or chills.  Risa Grill, PA-C [facility] [phone]  Clinic MD: Trula Slade

## 2021-03-11 ENCOUNTER — Telehealth: Payer: Self-pay

## 2021-03-11 ENCOUNTER — Other Ambulatory Visit: Payer: Self-pay

## 2021-03-11 DIAGNOSIS — D509 Iron deficiency anemia, unspecified: Secondary | ICD-10-CM | POA: Diagnosis not present

## 2021-03-11 DIAGNOSIS — Z992 Dependence on renal dialysis: Secondary | ICD-10-CM | POA: Diagnosis not present

## 2021-03-11 DIAGNOSIS — L299 Pruritus, unspecified: Secondary | ICD-10-CM | POA: Diagnosis not present

## 2021-03-11 DIAGNOSIS — R519 Headache, unspecified: Secondary | ICD-10-CM | POA: Diagnosis not present

## 2021-03-11 DIAGNOSIS — D631 Anemia in chronic kidney disease: Secondary | ICD-10-CM | POA: Diagnosis not present

## 2021-03-11 DIAGNOSIS — D689 Coagulation defect, unspecified: Secondary | ICD-10-CM | POA: Diagnosis not present

## 2021-03-11 DIAGNOSIS — N186 End stage renal disease: Secondary | ICD-10-CM | POA: Diagnosis not present

## 2021-03-11 DIAGNOSIS — E1129 Type 2 diabetes mellitus with other diabetic kidney complication: Secondary | ICD-10-CM | POA: Diagnosis not present

## 2021-03-11 DIAGNOSIS — N2581 Secondary hyperparathyroidism of renal origin: Secondary | ICD-10-CM | POA: Diagnosis not present

## 2021-03-11 MED ORDER — CLINDAMYCIN HCL 300 MG PO CAPS: 300.0000 mg | ORAL_CAPSULE | Freq: Three times a day (TID) | ORAL | 0 refills | Status: AC

## 2021-03-11 NOTE — Telephone Encounter (Signed)
Patient calls today to report pain in her R BKA site. She was seen yesterday and had pain then and was advised to speak to her PCP. Says she has had malodorous drainage for about a week. Discussed with APP, per provider, not present yesterday. Attempted 2x to return call to patient - VM not set up. Per PA recommendations, sent it RX of clindamycin and will move up patient appt to see surgeon if she wants.

## 2021-03-13 ENCOUNTER — Ambulatory Visit (INDEPENDENT_AMBULATORY_CARE_PROVIDER_SITE_OTHER): Payer: Medicare Other | Admitting: Internal Medicine

## 2021-03-13 DIAGNOSIS — L299 Pruritus, unspecified: Secondary | ICD-10-CM | POA: Diagnosis not present

## 2021-03-13 DIAGNOSIS — Z992 Dependence on renal dialysis: Secondary | ICD-10-CM | POA: Diagnosis not present

## 2021-03-13 DIAGNOSIS — R519 Headache, unspecified: Secondary | ICD-10-CM | POA: Diagnosis not present

## 2021-03-13 DIAGNOSIS — E1129 Type 2 diabetes mellitus with other diabetic kidney complication: Secondary | ICD-10-CM | POA: Diagnosis not present

## 2021-03-13 DIAGNOSIS — N2581 Secondary hyperparathyroidism of renal origin: Secondary | ICD-10-CM | POA: Diagnosis not present

## 2021-03-13 DIAGNOSIS — D509 Iron deficiency anemia, unspecified: Secondary | ICD-10-CM | POA: Diagnosis not present

## 2021-03-13 DIAGNOSIS — Z89511 Acquired absence of right leg below knee: Secondary | ICD-10-CM | POA: Diagnosis not present

## 2021-03-13 DIAGNOSIS — N186 End stage renal disease: Secondary | ICD-10-CM | POA: Diagnosis not present

## 2021-03-13 DIAGNOSIS — D631 Anemia in chronic kidney disease: Secondary | ICD-10-CM | POA: Diagnosis not present

## 2021-03-13 DIAGNOSIS — Z9889 Other specified postprocedural states: Secondary | ICD-10-CM

## 2021-03-13 DIAGNOSIS — D689 Coagulation defect, unspecified: Secondary | ICD-10-CM | POA: Diagnosis not present

## 2021-03-13 MED ORDER — OXYCODONE HCL 5 MG PO TABS: 2.5000 mg | ORAL_TABLET | ORAL | 0 refills | Status: AC | PRN

## 2021-03-13 NOTE — Assessment & Plan Note (Addendum)
Patient recently had a right below-knee amputation in June 2022.  She has been following with vascular surgery who just saw the patient last week.  The wound is incompletely healed.  Patient states that she is continue to have light pink drainage, chills and pain.  She is following up with [place] today for pain management.  States that vascular has not removed the surgical staples as of yet.  She was taking oxycodone and since running out of this medication has increased her gabapentin to 200 mg 3 times daily.  Discussed that due to her end-stage renal disease and dialysis that dose is unfortunately too high.  Recommending continuing gabapentin 100 mg 3 times weekly after dialysis.  Told her the max dose I would recommend is 300 mg 3 times per week after dialysis.  Patient expressed understanding.  Discussed that we can send in a short course of the oxycodone to help control her pain in the meantime.  Discussed taking 2.5 mg of oxycodone with Tylenol.  Patient expresses understanding.  Patient is to follow-up with vascular on 8/15.  Plan: - Refill oxycodone 2.5 mg every 4 hours for moderate to severe pain for 5 days - Patient to continue taking Tylenol and use oxycodone for more severe pain - Patient is to follow-up with vascular on 8/15

## 2021-03-13 NOTE — Progress Notes (Signed)
[facility] Internal Medicine Residency Telephone Encounter Continuity Care Appointment  HPI:  This telephone encounter was created for [name] on 03/13/2021 for the following purpose/cc pain.   Past Medical History:  Past Medical History:  Diagnosis Date   Anal abscess    chronic   Anxiety    CKD (chronic kidney disease) stage 5, GFR less than 15 ml/min (HCC) 06/19/2016   . Suspect etiologies to include diabetes and hypertension though cannot exclude HIV-associated nephropathy. Advised her against from using excessive Goody powder.   Constipation    trouble stoll coming out   Depression 06/28/2006   Qualifier: Diagnosis of  By: Riccardo Dubin MD, [place]     Diabetes mellitus type 2 in obese ([place]) 06/28/1994   DKA (diabetic ketoacidoses) 12/2014   Dyspnea    uses oxygeb 2 liters per minute at dialysis   End stage renal disease on dialysis [facility])    Erosive esophagitis    Esophageal reflux    ESRD on hemodialysis ([place]) 05/02/2019   Eye redness    Gastroparesis    ? diabetic   GERD (gastroesophageal reflux disease)    History of blood transfusion 2019   History of hypertension    off bp meds 2020 due to low bp with dialysis   Human immunodeficiency virus (HIV) disease ([place]) [id number]   Metabolic bone disease [id number]   Moderate nonproliferative diabetic retinopathy of both eyes ([place]) 11/21/2014   11/14/14: Noted on retinal imaging; needs follow-up imaging in 6 months  05/22/16: Noted on retinal imaging again; needs follow-up imaging in 6 months   Normocytic anemia    Pruritus, unspecified 05/20/2019   Renal failure (ARF), acute on chronic ([place]) 11/24/2018   tues, thurs sat [address] hs since sept 2020   Shortness of breath 07/01/2019   when lays doan at night, musy prop head up on 2 regular and 2 small pillows   Type 2 diabetes mellitus with diabetic peripheral angiopathy without gangrene ([place]) 05/01/2019   Type II diabetes mellitus ([place]) 1994   diagnosed around 1994    Wears dentures    lower   Wound dehiscence    Wound infection s/p L transmetatarsal amputation      ROS:  Review of Systems  Constitutional:  Positive for chills. Negative for fever.  Respiratory:  Negative for shortness of breath.   Cardiovascular:  Positive for leg swelling.  Gastrointestinal:  Negative for nausea and vomiting.  Neurological:  Negative for dizziness, weakness and headaches.     Assessment / Plan / Recommendations:  Please see A&P under problem oriented charting for assessment of the patient's acute and chronic medical conditions.  As always, pt is advised that if symptoms worsen or new symptoms arise, they should go to an urgent care facility or to to ER for further evaluation.   Consent and Medical Decision Making:  Patient discussed with Dr. Daryll Drown This is a telephone encounter between Kathy Frank and Governor [place] N Symphonie Schneiderman on 03/13/2021 for pain of right BKA. The visit was conducted with the patient located at home and Janicia Monterrosa N Almas Rake at [facility]. The patient's identity was confirmed using their DOB and current address. The patient has consented to being evaluated through a telephone encounter and understands the associated risks (an examination cannot be done and the patient may need to come in for an appointment) / benefits (allows the patient to remain at home, decreasing exposure to coronavirus). I personally spent 12 minutes on medical discussion.

## 2021-03-18 DIAGNOSIS — N2581 Secondary hyperparathyroidism of renal origin: Secondary | ICD-10-CM | POA: Diagnosis not present

## 2021-03-18 DIAGNOSIS — D689 Coagulation defect, unspecified: Secondary | ICD-10-CM | POA: Diagnosis not present

## 2021-03-18 DIAGNOSIS — R519 Headache, unspecified: Secondary | ICD-10-CM | POA: Diagnosis not present

## 2021-03-18 DIAGNOSIS — D631 Anemia in chronic kidney disease: Secondary | ICD-10-CM | POA: Diagnosis not present

## 2021-03-18 DIAGNOSIS — E1129 Type 2 diabetes mellitus with other diabetic kidney complication: Secondary | ICD-10-CM | POA: Diagnosis not present

## 2021-03-18 DIAGNOSIS — D509 Iron deficiency anemia, unspecified: Secondary | ICD-10-CM | POA: Diagnosis not present

## 2021-03-18 DIAGNOSIS — L299 Pruritus, unspecified: Secondary | ICD-10-CM | POA: Diagnosis not present

## 2021-03-18 DIAGNOSIS — N186 End stage renal disease: Secondary | ICD-10-CM | POA: Diagnosis not present

## 2021-03-18 DIAGNOSIS — Z992 Dependence on renal dialysis: Secondary | ICD-10-CM | POA: Diagnosis not present

## 2021-03-20 DIAGNOSIS — E1129 Type 2 diabetes mellitus with other diabetic kidney complication: Secondary | ICD-10-CM | POA: Diagnosis not present

## 2021-03-20 DIAGNOSIS — Z992 Dependence on renal dialysis: Secondary | ICD-10-CM | POA: Diagnosis not present

## 2021-03-20 DIAGNOSIS — D509 Iron deficiency anemia, unspecified: Secondary | ICD-10-CM | POA: Diagnosis not present

## 2021-03-20 DIAGNOSIS — D631 Anemia in chronic kidney disease: Secondary | ICD-10-CM | POA: Diagnosis not present

## 2021-03-20 DIAGNOSIS — R519 Headache, unspecified: Secondary | ICD-10-CM | POA: Diagnosis not present

## 2021-03-20 DIAGNOSIS — D689 Coagulation defect, unspecified: Secondary | ICD-10-CM | POA: Diagnosis not present

## 2021-03-20 DIAGNOSIS — L299 Pruritus, unspecified: Secondary | ICD-10-CM | POA: Diagnosis not present

## 2021-03-20 DIAGNOSIS — N2581 Secondary hyperparathyroidism of renal origin: Secondary | ICD-10-CM | POA: Diagnosis not present

## 2021-03-20 DIAGNOSIS — N186 End stage renal disease: Secondary | ICD-10-CM | POA: Diagnosis not present

## 2021-03-22 DIAGNOSIS — N186 End stage renal disease: Secondary | ICD-10-CM | POA: Diagnosis not present

## 2021-03-22 DIAGNOSIS — E1129 Type 2 diabetes mellitus with other diabetic kidney complication: Secondary | ICD-10-CM | POA: Diagnosis not present

## 2021-03-22 DIAGNOSIS — N2581 Secondary hyperparathyroidism of renal origin: Secondary | ICD-10-CM | POA: Diagnosis not present

## 2021-03-22 DIAGNOSIS — D631 Anemia in chronic kidney disease: Secondary | ICD-10-CM | POA: Diagnosis not present

## 2021-03-22 DIAGNOSIS — D689 Coagulation defect, unspecified: Secondary | ICD-10-CM | POA: Diagnosis not present

## 2021-03-22 DIAGNOSIS — D509 Iron deficiency anemia, unspecified: Secondary | ICD-10-CM | POA: Diagnosis not present

## 2021-03-22 DIAGNOSIS — L299 Pruritus, unspecified: Secondary | ICD-10-CM | POA: Diagnosis not present

## 2021-03-22 DIAGNOSIS — R519 Headache, unspecified: Secondary | ICD-10-CM | POA: Diagnosis not present

## 2021-03-22 DIAGNOSIS — Z992 Dependence on renal dialysis: Secondary | ICD-10-CM | POA: Diagnosis not present

## 2021-03-24 ENCOUNTER — Ambulatory Visit (INDEPENDENT_AMBULATORY_CARE_PROVIDER_SITE_OTHER): Payer: Medicare Other | Admitting: Physician Assistant

## 2021-03-24 ENCOUNTER — Other Ambulatory Visit: Payer: Self-pay

## 2021-03-24 VITALS — BP 177/89 | HR 80 | Temp 98.0°F | Resp 16 | Wt 175.0 lb

## 2021-03-24 DIAGNOSIS — I739 Peripheral vascular disease, unspecified: Secondary | ICD-10-CM

## 2021-03-24 NOTE — Progress Notes (Signed)
  POST OPERATIVE OFFICE NOTE    CC:  F/u for surgery  HPI:  This is a 45 y.o. 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 Trula Slade.    Pt returns today for follow up.  Pt states it is smelling bad and draining a lot.  She denise fever and chills.  No Known Allergies  Current Outpatient Medications  Medication Sig Dispense Refill   acetaminophen (TYLENOL) 325 MG tablet Take 2 tablets (650 mg total) by mouth every 6 (six) hours as needed for mild pain (or Fever >/= 101).     albuterol (VENTOLIN HFA) 108 (90 Base) MCG/ACT inhaler Inhale 2 puffs into the lungs every 4 (four) hours as needed for wheezing or shortness of breath. 8.5 g 0   amLODipine (NORVASC) 5 MG tablet Take 1 tablet (5 mg total) by mouth daily. 30 tablet 2   aspirin EC 81 MG tablet Take 1 tablet (81 mg total) by mouth daily. Swallow whole. 90 tablet 3   atorvastatin (LIPITOR) 40 MG tablet Take 1 tablet (40 mg total) by mouth daily. 90 tablet 3   bictegravir-emtricitabine-tenofovir AF (BIKTARVY) 50-200-25 MG TABS tablet TAKE 1 TABLET BY MOUTH DAILY. 30 tablet 0   calcitRIOL (ROCALTROL) 0.25 MCG capsule Take 5 capsules (1.25 mcg total) by mouth daily. 30 capsule 0   calcium acetate (PHOSLO) 667 MG capsule Take 3 capsules (2,001 mg total) by mouth 3 (three) times daily with meals. 180 capsule 0   clopidogrel (PLAVIX) 75 MG tablet Take 1 tablet (75 mg total) by mouth daily. 90 tablet 3   gabapentin (NEURONTIN) 100 MG capsule Take 1 capsule (100 mg total) by mouth 3 (three) times a week. 90 capsule 0   insulin glargine (LANTUS SOLOSTAR) 100 UNIT/ML Solostar Pen Inject 8 Units into the skin at bedtime. 15 mL 11   Insulin Pen Needle (PENTIPS) 32G X 4 MM MISC Use as directed 100 each 3   multivitamin (RENA-VIT) TABS tablet Take 1 tablet by mouth daily. 30 tablet 0   pantoprazole (PROTONIX) 40 MG tablet Take 1 tablet (40 mg total) by mouth daily. 30 tablet 0   polyethylene glycol (MIRALAX / GLYCOLAX) 17 g packet Take 17 g by mouth 2  (two) times daily. 14 each 0   sertraline (ZOLOFT) 50 MG tablet Take 1 tablet (50 mg total) by mouth at bedtime. 30 tablet 0   No current facility-administered medications for this visit.     ROS:  See HPI  Physical Exam:    Incision:  Non healing malodor right BKA with retained staples Extremities:  motor intact right knee and hip Heart RRR Lungs non labored breathing   Assessment/Plan:  This is a 45 y.o. female who has a non healing right BKA incision with ischemic changes.  Malodor and clear drainage.  I left the staples in place to prevent her undue pain.  I have scheduled her for revision of the right BKA.  She does not want it converted to an AKA if at all possible.  I assured her that DR. Brabham will discuss surgical plans with her before she is taken to the OR.       Roxy Horseman PA-C  [facility] [phone]   Clinic MD:  Trula Slade

## 2021-03-24 NOTE — H&P (View-Only) (Signed)
  POST OPERATIVE OFFICE NOTE    CC:  F/u for surgery  HPI:  This is a 45 y.o. 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 Trula Slade.    Pt returns today for follow up.  Pt states it is smelling bad and draining a lot.  She denise fever and chills.  No Known Allergies  Current Outpatient Medications  Medication Sig Dispense Refill   acetaminophen (TYLENOL) 325 MG tablet Take 2 tablets (650 mg total) by mouth every 6 (six) hours as needed for mild pain (or Fever >/= 101).     albuterol (VENTOLIN HFA) 108 (90 Base) MCG/ACT inhaler Inhale 2 puffs into the lungs every 4 (four) hours as needed for wheezing or shortness of breath. 8.5 g 0   amLODipine (NORVASC) 5 MG tablet Take 1 tablet (5 mg total) by mouth daily. 30 tablet 2   aspirin EC 81 MG tablet Take 1 tablet (81 mg total) by mouth daily. Swallow whole. 90 tablet 3   atorvastatin (LIPITOR) 40 MG tablet Take 1 tablet (40 mg total) by mouth daily. 90 tablet 3   bictegravir-emtricitabine-tenofovir AF (BIKTARVY) 50-200-25 MG TABS tablet TAKE 1 TABLET BY MOUTH DAILY. 30 tablet 0   calcitRIOL (ROCALTROL) 0.25 MCG capsule Take 5 capsules (1.25 mcg total) by mouth daily. 30 capsule 0   calcium acetate (PHOSLO) 667 MG capsule Take 3 capsules (2,001 mg total) by mouth 3 (three) times daily with meals. 180 capsule 0   clopidogrel (PLAVIX) 75 MG tablet Take 1 tablet (75 mg total) by mouth daily. 90 tablet 3   gabapentin (NEURONTIN) 100 MG capsule Take 1 capsule (100 mg total) by mouth 3 (three) times a week. 90 capsule 0   insulin glargine (LANTUS SOLOSTAR) 100 UNIT/ML Solostar Pen Inject 8 Units into the skin at bedtime. 15 mL 11   Insulin Pen Needle (PENTIPS) 32G X 4 MM MISC Use as directed 100 each 3   multivitamin (RENA-VIT) TABS tablet Take 1 tablet by mouth daily. 30 tablet 0   pantoprazole (PROTONIX) 40 MG tablet Take 1 tablet (40 mg total) by mouth daily. 30 tablet 0   polyethylene glycol (MIRALAX / GLYCOLAX) 17 g packet Take 17 g by mouth 2  (two) times daily. 14 each 0   sertraline (ZOLOFT) 50 MG tablet Take 1 tablet (50 mg total) by mouth at bedtime. 30 tablet 0   No current facility-administered medications for this visit.     ROS:  See HPI  Physical Exam:    Incision:  Non healing malodor right BKA with retained staples Extremities:  motor intact right knee and hip Heart RRR Lungs non labored breathing   Assessment/Plan:  This is a 45 y.o. female who has a non healing right BKA incision with ischemic changes.  Malodor and clear drainage.  I left the staples in place to prevent her undue pain.  I have scheduled her for revision of the right BKA.  She does not want it converted to an AKA if at all possible.  I assured her that DR. Brabham will discuss surgical plans with her before she is taken to the OR.       Roxy Horseman PA-C  [facility] [phone]   Clinic MD:  Trula Slade

## 2021-03-25 DIAGNOSIS — E1129 Type 2 diabetes mellitus with other diabetic kidney complication: Secondary | ICD-10-CM | POA: Diagnosis not present

## 2021-03-25 DIAGNOSIS — R519 Headache, unspecified: Secondary | ICD-10-CM | POA: Diagnosis not present

## 2021-03-25 DIAGNOSIS — L299 Pruritus, unspecified: Secondary | ICD-10-CM | POA: Diagnosis not present

## 2021-03-25 DIAGNOSIS — N186 End stage renal disease: Secondary | ICD-10-CM | POA: Diagnosis not present

## 2021-03-25 DIAGNOSIS — Z992 Dependence on renal dialysis: Secondary | ICD-10-CM | POA: Diagnosis not present

## 2021-03-25 DIAGNOSIS — D509 Iron deficiency anemia, unspecified: Secondary | ICD-10-CM | POA: Diagnosis not present

## 2021-03-25 DIAGNOSIS — N2581 Secondary hyperparathyroidism of renal origin: Secondary | ICD-10-CM | POA: Diagnosis not present

## 2021-03-25 DIAGNOSIS — D631 Anemia in chronic kidney disease: Secondary | ICD-10-CM | POA: Diagnosis not present

## 2021-03-25 DIAGNOSIS — D689 Coagulation defect, unspecified: Secondary | ICD-10-CM | POA: Diagnosis not present

## 2021-03-27 ENCOUNTER — Encounter (HOSPITAL_COMMUNITY): Payer: Self-pay | Admitting: Surgery

## 2021-03-27 ENCOUNTER — Other Ambulatory Visit: Payer: Self-pay

## 2021-03-27 ENCOUNTER — Other Ambulatory Visit (HOSPITAL_COMMUNITY): Payer: Self-pay

## 2021-03-27 NOTE — Progress Notes (Signed)
Kathy Frank denies chest pain or shortness of breath. Patient denies having any s/s of Covid in her household.  Patient denies any known exposure to Covid.   Kathy Frank has type II diabetes,. Patient reports that CBG run under 130. I instructed Kathy Frank to take 1/2 of Lantus dose tonight- 5 units. I instructed patient to check CBG after awaking and every 2 hours until arrival  to the hospital.  I Instructed patient if CBG is less than 70 to take 4 Glucose Tablets or 1 tube of Glucose Gel or 1/2 cup of a clear juice. Recheck CBG in 15 minutes if CBG is not over 70 call, pre- op desk at [phone] for further instructions.    I instructed Kathy Frank  to wash up  with antibiotic soap, if it is available.  Dry off with a clean towel. Do not put lotion, powder, cologne or deodorant or makeup.No jewelry or piercings. Men may shave their face and neck. Woman should not shave. No nail polish, artificial or acrylic nails. Wear clean clothes, brush your teeth. Glasses, contact lens,dentures or partials may not be worn in the OR. If you need to wear them, please bring a case for glasses, do not wear contacts or bring a case, the hospital does not have contact cases, dentures or partials will have to be removed , make sure they are clean, we will provide a denture cup to put them in. You will need some one to drive you home and a responsible person over the age of 30 to stay with you for the first 24 hours after surgery.

## 2021-03-28 ENCOUNTER — Inpatient Hospital Stay (HOSPITAL_COMMUNITY)
Admission: RE | Admit: 2021-03-28 | Discharge: 2021-04-02 | DRG: 474 | Disposition: A | Discharge status: Home / Self Care | Payer: Medicare Other | Attending: Surgery | Admitting: Surgery

## 2021-03-28 ENCOUNTER — Inpatient Hospital Stay (HOSPITAL_COMMUNITY): Payer: Medicare Other | Admitting: Anesthesiology

## 2021-03-28 ENCOUNTER — Encounter (HOSPITAL_COMMUNITY)
Admission: RE | Disposition: A | Discharge status: Home / Self Care | Payer: Self-pay | Source: Home / Self Care | Attending: Surgery

## 2021-03-28 ENCOUNTER — Other Ambulatory Visit: Payer: Self-pay

## 2021-03-28 ENCOUNTER — Encounter (HOSPITAL_COMMUNITY): Payer: Self-pay | Admitting: Surgery

## 2021-03-28 DIAGNOSIS — Z89512 Acquired absence of left leg below knee: Secondary | ICD-10-CM

## 2021-03-28 DIAGNOSIS — Z20822 Contact with and (suspected) exposure to covid-19: Secondary | ICD-10-CM | POA: Diagnosis present

## 2021-03-28 DIAGNOSIS — Z833 Family history of diabetes mellitus: Secondary | ICD-10-CM | POA: Diagnosis not present

## 2021-03-28 DIAGNOSIS — N186 End stage renal disease: Secondary | ICD-10-CM | POA: Diagnosis present

## 2021-03-28 DIAGNOSIS — B20 Human immunodeficiency virus [HIV] disease: Secondary | ICD-10-CM | POA: Diagnosis present

## 2021-03-28 DIAGNOSIS — I70203 Unspecified atherosclerosis of native arteries of extremities, bilateral legs: Secondary | ICD-10-CM | POA: Diagnosis not present

## 2021-03-28 DIAGNOSIS — D638 Anemia in other chronic diseases classified elsewhere: Secondary | ICD-10-CM | POA: Diagnosis present

## 2021-03-28 DIAGNOSIS — Y835 Amputation of limb(s) as the cause of abnormal reaction of the patient, or of later complication, without mention of misadventure at the time of the procedure: Secondary | ICD-10-CM | POA: Diagnosis present

## 2021-03-28 DIAGNOSIS — Z87891 Personal history of nicotine dependence: Secondary | ICD-10-CM

## 2021-03-28 DIAGNOSIS — Z8719 Personal history of other diseases of the digestive system: Secondary | ICD-10-CM | POA: Diagnosis not present

## 2021-03-28 DIAGNOSIS — T8789 Other complications of amputation stump: Secondary | ICD-10-CM | POA: Diagnosis not present

## 2021-03-28 DIAGNOSIS — M898X9 Other specified disorders of bone, unspecified site: Secondary | ICD-10-CM | POA: Diagnosis not present

## 2021-03-28 DIAGNOSIS — E1129 Type 2 diabetes mellitus with other diabetic kidney complication: Secondary | ICD-10-CM | POA: Diagnosis not present

## 2021-03-28 DIAGNOSIS — E1151 Type 2 diabetes mellitus with diabetic peripheral angiopathy without gangrene: Secondary | ICD-10-CM | POA: Diagnosis not present

## 2021-03-28 DIAGNOSIS — E113393 Type 2 diabetes mellitus with moderate nonproliferative diabetic retinopathy without macular edema, bilateral: Secondary | ICD-10-CM | POA: Diagnosis not present

## 2021-03-28 DIAGNOSIS — E1143 Type 2 diabetes mellitus with diabetic autonomic (poly)neuropathy: Secondary | ICD-10-CM | POA: Diagnosis present

## 2021-03-28 DIAGNOSIS — K3184 Gastroparesis: Secondary | ICD-10-CM | POA: Diagnosis present

## 2021-03-28 DIAGNOSIS — T8743 Infection of amputation stump, right lower extremity: Secondary | ICD-10-CM | POA: Diagnosis present

## 2021-03-28 DIAGNOSIS — N250 Renal osteodystrophy: Secondary | ICD-10-CM | POA: Diagnosis not present

## 2021-03-28 DIAGNOSIS — Z79899 Other long term (current) drug therapy: Secondary | ICD-10-CM

## 2021-03-28 DIAGNOSIS — Z7902 Long term (current) use of antithrombotics/antiplatelets: Secondary | ICD-10-CM

## 2021-03-28 DIAGNOSIS — Z794 Long term (current) use of insulin: Secondary | ICD-10-CM

## 2021-03-28 DIAGNOSIS — I120 Hypertensive chronic kidney disease with stage 5 chronic kidney disease or end stage renal disease: Secondary | ICD-10-CM | POA: Diagnosis not present

## 2021-03-28 DIAGNOSIS — Z89511 Acquired absence of right leg below knee: Secondary | ICD-10-CM

## 2021-03-28 DIAGNOSIS — F32A Depression, unspecified: Secondary | ICD-10-CM | POA: Diagnosis present

## 2021-03-28 DIAGNOSIS — F419 Anxiety disorder, unspecified: Secondary | ICD-10-CM | POA: Diagnosis present

## 2021-03-28 DIAGNOSIS — K219 Gastro-esophageal reflux disease without esophagitis: Secondary | ICD-10-CM | POA: Diagnosis present

## 2021-03-28 DIAGNOSIS — Z992 Dependence on renal dialysis: Secondary | ICD-10-CM

## 2021-03-28 DIAGNOSIS — Z7982 Long term (current) use of aspirin: Secondary | ICD-10-CM

## 2021-03-28 DIAGNOSIS — E1122 Type 2 diabetes mellitus with diabetic chronic kidney disease: Secondary | ICD-10-CM | POA: Diagnosis not present

## 2021-03-28 DIAGNOSIS — D631 Anemia in chronic kidney disease: Secondary | ICD-10-CM | POA: Diagnosis not present

## 2021-03-28 DIAGNOSIS — I739 Peripheral vascular disease, unspecified: Secondary | ICD-10-CM | POA: Diagnosis present

## 2021-03-28 DIAGNOSIS — Z89612 Acquired absence of left leg above knee: Secondary | ICD-10-CM

## 2021-03-28 DIAGNOSIS — N2581 Secondary hyperparathyroidism of renal origin: Secondary | ICD-10-CM | POA: Diagnosis not present

## 2021-03-28 HISTORY — PX: AMPUTATION: SHX166

## 2021-03-28 LAB — RENAL FUNCTION PANEL
Albumin: 2.9 g/dL — ABNORMAL LOW (ref 3.5–5.0)
Anion gap: 19 — ABNORMAL HIGH (ref 5–15)
BUN: 54 mg/dL — ABNORMAL HIGH (ref 6–20)
CO2: 21 mmol/L — ABNORMAL LOW (ref 22–32)
Calcium: 8.4 mg/dL — ABNORMAL LOW (ref 8.9–10.3)
Chloride: 97 mmol/L — ABNORMAL LOW (ref 98–111)
Creatinine, Ser: 8.62 mg/dL — ABNORMAL HIGH (ref 0.44–1.00)
GFR, Estimated: 5 mL/min — ABNORMAL LOW (ref >60)
Glucose, Bld: 146 mg/dL — ABNORMAL HIGH (ref 70–99)
Phosphorus: 9.1 mg/dL — ABNORMAL HIGH (ref 2.5–4.6)
Potassium: 4.5 mmol/L (ref 3.5–5.1)
Sodium: 137 mmol/L (ref 135–145)

## 2021-03-28 LAB — COMPREHENSIVE METABOLIC PANEL
ALT: 11 U/L (ref 0–44)
AST: 13 U/L — ABNORMAL LOW (ref 15–41)
Albumin: 2.9 g/dL — ABNORMAL LOW (ref 3.5–5.0)
Alkaline Phosphatase: 59 U/L (ref 38–126)
Anion gap: 17 — ABNORMAL HIGH (ref 5–15)
BUN: 54 mg/dL — ABNORMAL HIGH (ref 6–20)
CO2: 23 mmol/L (ref 22–32)
Calcium: 8.6 mg/dL — ABNORMAL LOW (ref 8.9–10.3)
Chloride: 96 mmol/L — ABNORMAL LOW (ref 98–111)
Creatinine, Ser: 8.72 mg/dL — ABNORMAL HIGH (ref 0.44–1.00)
GFR, Estimated: 5 mL/min — ABNORMAL LOW (ref >60)
Glucose, Bld: 150 mg/dL — ABNORMAL HIGH (ref 70–99)
Potassium: 4.5 mmol/L (ref 3.5–5.1)
Sodium: 136 mmol/L (ref 135–145)
Total Bilirubin: 0.6 mg/dL (ref 0.3–1.2)
Total Protein: 8.1 g/dL (ref 6.5–8.1)

## 2021-03-28 LAB — CBC
HCT: 30.6 % — ABNORMAL LOW (ref 36.0–46.0)
Hemoglobin: 9.8 g/dL — ABNORMAL LOW (ref 12.0–15.0)
MCH: 30.8 pg (ref 26.0–34.0)
MCHC: 32 g/dL (ref 30.0–36.0)
MCV: 96.2 fL (ref 80.0–100.0)
Platelets: 416 10*3/uL — ABNORMAL HIGH (ref 150–400)
RBC: 3.18 MIL/uL — ABNORMAL LOW (ref 3.87–5.11)
RDW: 17.6 % — ABNORMAL HIGH (ref 11.5–15.5)
WBC: 9.4 10*3/uL (ref 4.0–10.5)
nRBC: 0.2 % (ref 0.0–0.2)

## 2021-03-28 LAB — APTT: aPTT: 35 seconds (ref 24–36)

## 2021-03-28 LAB — GLUCOSE, CAPILLARY
Glucose-Capillary: 156 mg/dL — ABNORMAL HIGH (ref 70–99)
Glucose-Capillary: 236 mg/dL — ABNORMAL HIGH (ref 70–99)
Glucose-Capillary: 199 mg/dL — ABNORMAL HIGH (ref 70–99)
Glucose-Capillary: 312 mg/dL — ABNORMAL HIGH (ref 70–99)
Glucose-Capillary: 408 mg/dL — ABNORMAL HIGH (ref 70–99)

## 2021-03-28 LAB — TYPE AND SCREEN
ABO/RH(D): B POS
Antibody Screen: NEGATIVE

## 2021-03-28 LAB — HCG, SERUM, QUALITATIVE: Preg, Serum: NEGATIVE

## 2021-03-28 LAB — PROTIME-INR
INR: 1 (ref 0.8–1.2)
Prothrombin Time: 13.4 seconds (ref 11.4–15.2)

## 2021-03-28 LAB — SARS CORONAVIRUS 2 BY RT PCR (HOSPITAL ORDER, PERFORMED IN ~~LOC~~ HOSPITAL LAB): SARS Coronavirus 2: NEGATIVE

## 2021-03-28 SURGERY — AMPUTATION BELOW KNEE
Anesthesia: General | Site: Knee | Laterality: Right

## 2021-03-28 MED ORDER — INSULIN ASPART 100 UNIT/ML IJ SOLN
6.0000 [IU] | Freq: Once | INTRAMUSCULAR | Status: AC
  Administered 2021-03-28: 6 [IU] via SUBCUTANEOUS

## 2021-03-28 MED ORDER — ALBUTEROL SULFATE HFA 108 (90 BASE) MCG/ACT IN AERS: 2.0000 | INHALATION_SPRAY | RESPIRATORY_TRACT | Status: DC | PRN

## 2021-03-28 MED ORDER — INSULIN ASPART 100 UNIT/ML IJ SOLN
0.0000 [IU] | Freq: Three times a day (TID) | INTRAMUSCULAR | Status: DC
  Administered 2021-03-28: 4 [IU] via SUBCUTANEOUS
  Administered 2021-03-29: 2 [IU] via SUBCUTANEOUS
  Administered 2021-03-29: 1 [IU] via SUBCUTANEOUS
  Administered 2021-03-29: 3 [IU] via SUBCUTANEOUS
  Administered 2021-03-30 – 2021-04-01 (×3): 1 [IU] via SUBCUTANEOUS

## 2021-03-28 MED ORDER — DEXAMETHASONE SODIUM PHOSPHATE 10 MG/ML IJ SOLN
INTRAMUSCULAR | Status: DC | PRN
  Administered 2021-03-28: 10 mg via INTRAVENOUS

## 2021-03-28 MED ORDER — CEFAZOLIN SODIUM-DEXTROSE 2-4 GM/100ML-% IV SOLN
INTRAVENOUS | Status: AC
  Filled 2021-03-28: qty 100

## 2021-03-28 MED ORDER — DEXAMETHASONE SODIUM PHOSPHATE 10 MG/ML IJ SOLN
INTRAMUSCULAR | Status: AC
  Filled 2021-03-28: qty 1

## 2021-03-28 MED ORDER — HYDROCODONE-ACETAMINOPHEN 5-325 MG PO TABS
1.0000 | ORAL_TABLET | ORAL | Status: DC | PRN
  Administered 2021-03-28: 2 via ORAL
  Filled 2021-03-28: qty 2

## 2021-03-28 MED ORDER — MIDAZOLAM HCL 2 MG/2ML IJ SOLN
INTRAMUSCULAR | Status: AC
  Filled 2021-03-28: qty 2

## 2021-03-28 MED ORDER — GUAIFENESIN-DM 100-10 MG/5ML PO SYRP: 15.0000 mL | ORAL_SOLUTION | ORAL | Status: DC | PRN

## 2021-03-28 MED ORDER — ORAL CARE MOUTH RINSE: 15.0000 mL | Freq: Once | OROMUCOSAL | Status: AC

## 2021-03-28 MED ORDER — PANTOPRAZOLE SODIUM 40 MG PO TBEC
40.0000 mg | DELAYED_RELEASE_TABLET | Freq: Every day | ORAL | Status: DC
  Administered 2021-03-29 – 2021-04-02 (×5): 40 mg via ORAL
  Filled 2021-03-28 (×5): qty 1

## 2021-03-28 MED ORDER — CHLORHEXIDINE GLUCONATE CLOTH 2 % EX PADS
6.0000 | MEDICATED_PAD | Freq: Every day | CUTANEOUS | Status: DC
  Administered 2021-03-29 – 2021-04-02 (×5): 6 via TOPICAL

## 2021-03-28 MED ORDER — ONDANSETRON HCL 4 MG/2ML IJ SOLN
INTRAMUSCULAR | Status: DC | PRN
  Administered 2021-03-28: 4 mg via INTRAVENOUS

## 2021-03-28 MED ORDER — ALUM & MAG HYDROXIDE-SIMETH 200-200-20 MG/5ML PO SUSP: 15.0000 mL | ORAL | Status: DC | PRN

## 2021-03-28 MED ORDER — MORPHINE SULFATE (PF) 2 MG/ML IV SOLN
0.5000 mg | INTRAVENOUS | Status: DC | PRN
  Administered 2021-03-28 – 2021-04-02 (×18): 1 mg via INTRAVENOUS
  Filled 2021-03-28 (×18): qty 1

## 2021-03-28 MED ORDER — FENTANYL CITRATE (PF) 250 MCG/5ML IJ SOLN
INTRAMUSCULAR | Status: AC
  Filled 2021-03-28: qty 5

## 2021-03-28 MED ORDER — HYDRALAZINE HCL 20 MG/ML IJ SOLN: 10.0000 mg | Freq: Once | INTRAMUSCULAR | Status: AC

## 2021-03-28 MED ORDER — POTASSIUM CHLORIDE CRYS ER 20 MEQ PO TBCR: 20.0000 meq | EXTENDED_RELEASE_TABLET | Freq: Every day | ORAL | Status: DC | PRN

## 2021-03-28 MED ORDER — ACETAMINOPHEN 325 MG PO TABS: 650.0000 mg | ORAL_TABLET | Freq: Four times a day (QID) | ORAL | Status: DC | PRN

## 2021-03-28 MED ORDER — HYDRALAZINE HCL 20 MG/ML IJ SOLN
INTRAMUSCULAR | Status: AC
  Administered 2021-03-28: 10 mg via INTRAVENOUS
  Filled 2021-03-28: qty 1

## 2021-03-28 MED ORDER — ATORVASTATIN CALCIUM 40 MG PO TABS
40.0000 mg | ORAL_TABLET | Freq: Every day | ORAL | Status: DC
  Administered 2021-03-29 – 2021-04-02 (×5): 40 mg via ORAL
  Filled 2021-03-28 (×5): qty 1

## 2021-03-28 MED ORDER — SODIUM CHLORIDE 0.9 % IV SOLN: 100.0000 mL | INTRAVENOUS | Status: DC | PRN

## 2021-03-28 MED ORDER — LIDOCAINE 2% (20 MG/ML) 5 ML SYRINGE
INTRAMUSCULAR | Status: AC
  Filled 2021-03-28: qty 5

## 2021-03-28 MED ORDER — MAGNESIUM SULFATE 2 GM/50ML IV SOLN
2.0000 g | Freq: Every day | INTRAVENOUS | Status: DC | PRN
Start: 2021-03-28 — End: 2021-04-02

## 2021-03-28 MED ORDER — HEPARIN SODIUM (PORCINE) 5000 UNIT/ML IJ SOLN
5000.0000 [IU] | Freq: Three times a day (TID) | INTRAMUSCULAR | Status: DC
  Administered 2021-03-28 – 2021-04-02 (×13): 5000 [IU] via SUBCUTANEOUS
  Filled 2021-03-28 (×14): qty 1

## 2021-03-28 MED ORDER — ONDANSETRON HCL 4 MG/2ML IJ SOLN
INTRAMUSCULAR | Status: AC
  Filled 2021-03-28: qty 2

## 2021-03-28 MED ORDER — CEFAZOLIN SODIUM-DEXTROSE 2-4 GM/100ML-% IV SOLN
2.0000 g | INTRAVENOUS | Status: AC
  Administered 2021-03-28: 2 g via INTRAVENOUS

## 2021-03-28 MED ORDER — BISACODYL 5 MG PO TBEC: 5.0000 mg | DELAYED_RELEASE_TABLET | Freq: Every day | ORAL | Status: DC | PRN

## 2021-03-28 MED ORDER — HYDROCODONE-ACETAMINOPHEN 7.5-325 MG PO TABS
1.0000 | ORAL_TABLET | ORAL | Status: DC | PRN
  Administered 2021-03-29 – 2021-03-30 (×2): 2 via ORAL
  Filled 2021-03-28 (×2): qty 2

## 2021-03-28 MED ORDER — CHLORHEXIDINE GLUCONATE 0.12 % MT SOLN
15.0000 mL | Freq: Once | OROMUCOSAL | Status: AC
Start: 2021-03-28 — End: 2021-03-28
  Administered 2021-03-28: 15 mL via OROMUCOSAL
  Filled 2021-03-28: qty 15

## 2021-03-28 MED ORDER — FENTANYL CITRATE (PF) 250 MCG/5ML IJ SOLN
INTRAMUSCULAR | Status: DC | PRN
  Administered 2021-03-28: 50 ug via INTRAVENOUS

## 2021-03-28 MED ORDER — SODIUM CHLORIDE FLUSH 0.9 % IV SOLN
INTRAVENOUS | Status: DC | PRN
  Administered 2021-03-28: 100 mL

## 2021-03-28 MED ORDER — LABETALOL HCL 5 MG/ML IV SOLN
10.0000 mg | INTRAVENOUS | Status: DC | PRN
Start: 2021-03-28 — End: 2021-04-02
  Administered 2021-03-31: 10 mg via INTRAVENOUS
  Filled 2021-03-28: qty 4

## 2021-03-28 MED ORDER — AMLODIPINE BESYLATE 5 MG PO TABS
5.0000 mg | ORAL_TABLET | Freq: Every day | ORAL | Status: DC
  Administered 2021-03-30 – 2021-04-02 (×4): 5 mg via ORAL
  Filled 2021-03-28 (×5): qty 1

## 2021-03-28 MED ORDER — CHLORHEXIDINE GLUCONATE CLOTH 2 % EX PADS: 6.0000 | MEDICATED_PAD | Freq: Once | CUTANEOUS | Status: DC

## 2021-03-28 MED ORDER — BICTEGRAVIR-EMTRICITAB-TENOFOV 50-200-25 MG PO TABS
1.0000 | ORAL_TABLET | Freq: Every day | ORAL | Status: DC
  Administered 2021-03-29 – 2021-04-02 (×5): 1 via ORAL
  Filled 2021-03-28 (×5): qty 1

## 2021-03-28 MED ORDER — LIDOCAINE 2% (20 MG/ML) 5 ML SYRINGE
INTRAMUSCULAR | Status: DC | PRN
  Administered 2021-03-28: 20 mg via INTRAVENOUS

## 2021-03-28 MED ORDER — SODIUM CHLORIDE 0.9% FLUSH: 3.0000 mL | INTRAVENOUS | Status: DC | PRN

## 2021-03-28 MED ORDER — HYDRALAZINE HCL 20 MG/ML IJ SOLN
5.0000 mg | INTRAMUSCULAR | Status: DC | PRN
Start: 2021-03-28 — End: 2021-04-02
  Administered 2021-04-01: 5 mg via INTRAVENOUS
  Filled 2021-03-28: qty 1

## 2021-03-28 MED ORDER — CLOPIDOGREL BISULFATE 75 MG PO TABS
75.0000 mg | ORAL_TABLET | Freq: Every day | ORAL | Status: DC
  Administered 2021-03-29 – 2021-04-02 (×5): 75 mg via ORAL
  Filled 2021-03-28 (×5): qty 1

## 2021-03-28 MED ORDER — LIDOCAINE HCL (PF) 1 % IJ SOLN: 5.0000 mL | INTRAMUSCULAR | Status: DC | PRN

## 2021-03-28 MED ORDER — EPHEDRINE SULFATE-NACL 50-0.9 MG/10ML-% IV SOSY
PREFILLED_SYRINGE | INTRAVENOUS | Status: DC | PRN
  Administered 2021-03-28: 10 mg via INTRAVENOUS

## 2021-03-28 MED ORDER — METOPROLOL TARTRATE 5 MG/5ML IV SOLN: 2.0000 mg | INTRAVENOUS | Status: DC | PRN

## 2021-03-28 MED ORDER — FENTANYL CITRATE (PF) 100 MCG/2ML IJ SOLN
INTRAMUSCULAR | Status: AC
  Filled 2021-03-28: qty 2

## 2021-03-28 MED ORDER — SENNOSIDES-DOCUSATE SODIUM 8.6-50 MG PO TABS: 1.0000 | ORAL_TABLET | Freq: Every evening | ORAL | Status: DC | PRN

## 2021-03-28 MED ORDER — POLYETHYLENE GLYCOL 3350 17 G PO PACK
17.0000 g | PACK | Freq: Two times a day (BID) | ORAL | Status: DC
  Administered 2021-03-28 – 2021-04-02 (×8): 17 g via ORAL
  Filled 2021-03-28 (×9): qty 1

## 2021-03-28 MED ORDER — RENA-VITE PO TABS
1.0000 | ORAL_TABLET | Freq: Every day | ORAL | Status: DC
  Administered 2021-03-29 – 2021-04-01 (×5): 1 via ORAL
  Filled 2021-03-28 (×5): qty 1

## 2021-03-28 MED ORDER — GABAPENTIN 100 MG PO CAPS
100.0000 mg | ORAL_CAPSULE | ORAL | Status: DC
  Administered 2021-03-31: 100 mg via ORAL
  Filled 2021-03-28: qty 1

## 2021-03-28 MED ORDER — SODIUM CHLORIDE 0.9% FLUSH
3.0000 mL | Freq: Two times a day (BID) | INTRAVENOUS | Status: DC
  Administered 2021-03-28 – 2021-04-01 (×9): 3 mL via INTRAVENOUS

## 2021-03-28 MED ORDER — EPHEDRINE 5 MG/ML INJ
INTRAVENOUS | Status: AC
  Filled 2021-03-28: qty 5

## 2021-03-28 MED ORDER — INSULIN ASPART 100 UNIT/ML IJ SOLN
INTRAMUSCULAR | Status: AC
  Filled 2021-03-28: qty 1

## 2021-03-28 MED ORDER — ASPIRIN EC 81 MG PO TBEC
81.0000 mg | DELAYED_RELEASE_TABLET | Freq: Every day | ORAL | Status: DC
  Administered 2021-03-28 – 2021-04-02 (×6): 81 mg via ORAL
  Filled 2021-03-28 (×6): qty 1

## 2021-03-28 MED ORDER — CALCITRIOL 0.25 MCG PO CAPS
1.2500 ug | ORAL_CAPSULE | Freq: Every day | ORAL | Status: DC
  Administered 2021-03-29 – 2021-03-31 (×3): 1.25 ug via ORAL
  Filled 2021-03-28 (×3): qty 5

## 2021-03-28 MED ORDER — SODIUM CHLORIDE 0.9 % IV SOLN: INTRAVENOUS | Status: DC

## 2021-03-28 MED ORDER — SODIUM CHLORIDE 0.9 % IV SOLN: 250.0000 mL | INTRAVENOUS | Status: DC | PRN

## 2021-03-28 MED ORDER — PHENYLEPHRINE HCL-NACL 20-0.9 MG/250ML-% IV SOLN
INTRAVENOUS | Status: DC | PRN
  Administered 2021-03-28: 50 ug/min via INTRAVENOUS

## 2021-03-28 MED ORDER — DOCUSATE SODIUM 100 MG PO CAPS
100.0000 mg | ORAL_CAPSULE | Freq: Every day | ORAL | Status: DC
  Administered 2021-03-29 – 2021-04-02 (×5): 100 mg via ORAL
  Filled 2021-03-28 (×5): qty 1

## 2021-03-28 MED ORDER — PENTAFLUOROPROP-TETRAFLUOROETH EX AERO: 1.0000 "application " | INHALATION_SPRAY | CUTANEOUS | Status: DC | PRN

## 2021-03-28 MED ORDER — ONDANSETRON HCL 4 MG/2ML IJ SOLN
4.0000 mg | Freq: Four times a day (QID) | INTRAMUSCULAR | Status: DC | PRN
  Administered 2021-03-28 – 2021-03-31 (×5): 4 mg via INTRAVENOUS
  Filled 2021-03-28 (×5): qty 2

## 2021-03-28 MED ORDER — ACETAMINOPHEN 500 MG PO TABS: 1000.0000 mg | ORAL_TABLET | Freq: Once | ORAL | Status: DC

## 2021-03-28 MED ORDER — PHENOL 1.4 % MT LIQD: 1.0000 | OROMUCOSAL | Status: DC | PRN

## 2021-03-28 MED ORDER — MIDAZOLAM HCL 5 MG/5ML IJ SOLN
INTRAMUSCULAR | Status: DC | PRN
  Administered 2021-03-28: 2 mg via INTRAVENOUS

## 2021-03-28 MED ORDER — INSULIN GLARGINE 100 UNIT/ML SOLOSTAR PEN: 8.0000 [IU] | PEN_INJECTOR | Freq: Every day | SUBCUTANEOUS | Status: DC

## 2021-03-28 MED ORDER — 0.9 % SODIUM CHLORIDE (POUR BTL) OPTIME
TOPICAL | Status: DC | PRN
  Administered 2021-03-28 (×2): 1000 mL

## 2021-03-28 MED ORDER — BUPIVACAINE HCL (PF) 0.5 % IJ SOLN
INTRAMUSCULAR | Status: AC
  Filled 2021-03-28: qty 30

## 2021-03-28 MED ORDER — LABETALOL HCL 5 MG/ML IV SOLN
10.0000 mg | Freq: Once | INTRAVENOUS | Status: AC
  Administered 2021-03-28: 10 mg via INTRAVENOUS
  Filled 2021-03-28: qty 4

## 2021-03-28 MED ORDER — PROPOFOL 10 MG/ML IV BOLUS
INTRAVENOUS | Status: DC | PRN
  Administered 2021-03-28: 130 mg via INTRAVENOUS

## 2021-03-28 MED ORDER — PANTOPRAZOLE SODIUM 40 MG PO TBEC: 40.0000 mg | DELAYED_RELEASE_TABLET | Freq: Every day | ORAL | Status: DC

## 2021-03-28 MED ORDER — CALCIUM ACETATE (PHOS BINDER) 667 MG PO CAPS
2001.0000 mg | ORAL_CAPSULE | Freq: Three times a day (TID) | ORAL | Status: DC
  Administered 2021-03-28 – 2021-04-02 (×13): 2001 mg via ORAL
  Filled 2021-03-28 (×13): qty 3

## 2021-03-28 MED ORDER — BUPIVACAINE LIPOSOME 1.3 % IJ SUSP
INTRAMUSCULAR | Status: AC
  Filled 2021-03-28: qty 20

## 2021-03-28 MED ORDER — PHENYLEPHRINE 40 MCG/ML (10ML) SYRINGE FOR IV PUSH (FOR BLOOD PRESSURE SUPPORT)
PREFILLED_SYRINGE | INTRAVENOUS | Status: DC | PRN
  Administered 2021-03-28 (×3): 40 ug via INTRAVENOUS
  Administered 2021-03-28 (×2): 120 ug via INTRAVENOUS

## 2021-03-28 MED ORDER — PROPOFOL 10 MG/ML IV BOLUS
INTRAVENOUS | Status: AC
  Filled 2021-03-28: qty 20

## 2021-03-28 MED ORDER — PROMETHAZINE HCL 25 MG/ML IJ SOLN: 6.2500 mg | INTRAMUSCULAR | Status: DC | PRN

## 2021-03-28 MED ORDER — FENTANYL CITRATE (PF) 100 MCG/2ML IJ SOLN
25.0000 ug | INTRAMUSCULAR | Status: DC | PRN
  Administered 2021-03-28 (×4): 25 ug via INTRAVENOUS

## 2021-03-28 MED ORDER — LIDOCAINE-PRILOCAINE 2.5-2.5 % EX CREA
1.0000 "application " | TOPICAL_CREAM | CUTANEOUS | Status: DC | PRN
  Filled 2021-03-28: qty 5

## 2021-03-28 MED ORDER — ALBUTEROL SULFATE (2.5 MG/3ML) 0.083% IN NEBU: 2.5000 mg | INHALATION_SOLUTION | Freq: Four times a day (QID) | RESPIRATORY_TRACT | Status: DC | PRN

## 2021-03-28 MED ORDER — SERTRALINE HCL 50 MG PO TABS
50.0000 mg | ORAL_TABLET | Freq: Every day | ORAL | Status: DC
  Administered 2021-03-28 – 2021-04-01 (×5): 50 mg via ORAL
  Filled 2021-03-28 (×5): qty 1

## 2021-03-28 MED ORDER — ACETAMINOPHEN 325 MG PO TABS
325.0000 mg | ORAL_TABLET | Freq: Four times a day (QID) | ORAL | Status: DC | PRN
  Administered 2021-04-01: 650 mg via ORAL
  Filled 2021-03-28: qty 2

## 2021-03-28 MED ORDER — ACETAMINOPHEN 500 MG PO TABS
500.0000 mg | ORAL_TABLET | Freq: Four times a day (QID) | ORAL | Status: AC
  Administered 2021-03-28 – 2021-03-29 (×4): 500 mg via ORAL
  Filled 2021-03-28 (×4): qty 1

## 2021-03-28 MED ORDER — INSULIN GLARGINE-YFGN 100 UNIT/ML ~~LOC~~ SOLN
8.0000 [IU] | Freq: Every day | SUBCUTANEOUS | Status: DC
  Administered 2021-03-28 – 2021-04-01 (×5): 8 [IU] via SUBCUTANEOUS
  Filled 2021-03-28 (×7): qty 0.08

## 2021-03-28 SURGICAL SUPPLY — 60 items
BAG COUNTER SPONGE SURGICOUNT (BAG) ×3 IMPLANT
BAG SPNG CNTER NS LX DISP (BAG) ×2
BANDAGE ESMARK 6X9 LF (GAUZE/BANDAGES/DRESSINGS) IMPLANT
BLADE SAW GIGLI 510 (BLADE) ×3 IMPLANT
BNDG CMPR 9X6 STRL LF SNTH (GAUZE/BANDAGES/DRESSINGS)
BNDG COHESIVE 6X5 TAN STRL LF (GAUZE/BANDAGES/DRESSINGS) ×3 IMPLANT
BNDG ELASTIC 4X5.8 VLCR STR LF (GAUZE/BANDAGES/DRESSINGS) ×3 IMPLANT
BNDG ELASTIC 6X5.8 VLCR STR LF (GAUZE/BANDAGES/DRESSINGS) ×3 IMPLANT
BNDG ESMARK 6X9 LF (GAUZE/BANDAGES/DRESSINGS)
BNDG GAUZE ELAST 4 BULKY (GAUZE/BANDAGES/DRESSINGS) ×2 IMPLANT
CANISTER SUCT 3000ML PPV (MISCELLANEOUS) ×3 IMPLANT
CLIP VESOCCLUDE MED 6/CT (CLIP) IMPLANT
COVER SURGICAL LIGHT HANDLE (MISCELLANEOUS) ×3 IMPLANT
CUFF TOURN SGL QUICK 34 (TOURNIQUET CUFF)
CUFF TOURN SGL QUICK 42 (TOURNIQUET CUFF) IMPLANT
CUFF TRNQT CYL 34X4.125X (TOURNIQUET CUFF) IMPLANT
DRAIN CHANNEL 19F RND (DRAIN) IMPLANT
DRAPE HALF SHEET 40X57 (DRAPES) ×5 IMPLANT
DRAPE INCISE IOBAN 66X45 STRL (DRAPES) IMPLANT
DRAPE ORTHO SPLIT 77X108 STRL (DRAPES) ×6
DRAPE SURG ORHT 6 SPLT 77X108 (DRAPES) ×4 IMPLANT
DRESSING PREVENA PLUS CUSTOM (GAUZE/BANDAGES/DRESSINGS) IMPLANT
DRSG ADAPTIC 3X8 NADH LF (GAUZE/BANDAGES/DRESSINGS) ×3 IMPLANT
DRSG PREVENA PLUS CUSTOM (GAUZE/BANDAGES/DRESSINGS)
ELECT REM PT RETURN 9FT ADLT (ELECTROSURGICAL) ×3
ELECTRODE REM PT RTRN 9FT ADLT (ELECTROSURGICAL) ×2 IMPLANT
EVACUATOR SILICONE 100CC (DRAIN) IMPLANT
GAUZE 4X4 16PLY ~~LOC~~+RFID DBL (SPONGE) ×1 IMPLANT
GAUZE SPONGE 4X4 12PLY STRL (GAUZE/BANDAGES/DRESSINGS) ×3 IMPLANT
GLOVE SURG POLYISO LF SZ7.5 (GLOVE) ×3 IMPLANT
GLOVE SURG UNDER POLY LF SZ7.5 (GLOVE) ×3 IMPLANT
GOWN STRL REUS W/ TWL LRG LVL3 (GOWN DISPOSABLE) ×4 IMPLANT
GOWN STRL REUS W/ TWL XL LVL3 (GOWN DISPOSABLE) ×2 IMPLANT
GOWN STRL REUS W/TWL LRG LVL3 (GOWN DISPOSABLE) ×6
GOWN STRL REUS W/TWL XL LVL3 (GOWN DISPOSABLE) ×3
KIT BASIN OR (CUSTOM PROCEDURE TRAY) ×3 IMPLANT
KIT TURNOVER KIT B (KITS) ×3 IMPLANT
NDL 18GX1X1/2 (RX/OR ONLY) (NEEDLE) IMPLANT
NEEDLE 18GX1X1/2 (RX/OR ONLY) (NEEDLE) ×3 IMPLANT
NS IRRIG 1000ML POUR BTL (IV SOLUTION) ×5 IMPLANT
PACK GENERAL/GYN (CUSTOM PROCEDURE TRAY) ×3 IMPLANT
PAD ARMBOARD 7.5X6 YLW CONV (MISCELLANEOUS) ×6 IMPLANT
PREVENA RESTOR ARTHOFORM 46X30 (CANNISTER) IMPLANT
SPONGE T-LAP 18X18 ~~LOC~~+RFID (SPONGE) ×4 IMPLANT
STAPLER VISISTAT 35W (STAPLE) ×3 IMPLANT
STOCKINETTE IMPERVIOUS LG (DRAPES) ×3 IMPLANT
SUT BONE WAX W31G (SUTURE) IMPLANT
SUT ETHILON 3 0 PS 1 (SUTURE) IMPLANT
SUT SILK 0 TIES 10X30 (SUTURE) ×5 IMPLANT
SUT SILK 2 0 (SUTURE)
SUT SILK 2 0 SH CR/8 (SUTURE) ×3 IMPLANT
SUT SILK 2-0 18XBRD TIE 12 (SUTURE) IMPLANT
SUT SILK 3 0 (SUTURE)
SUT SILK 3-0 18XBRD TIE 12 (SUTURE) IMPLANT
SUT VIC AB 2-0 CT1 18 (SUTURE) ×8 IMPLANT
SYR 50ML LL SCALE MARK (SYRINGE) ×2 IMPLANT
TAPE UMBILICAL COTTON 1/8X30 (MISCELLANEOUS) IMPLANT
TOWEL GREEN STERILE (TOWEL DISPOSABLE) ×6 IMPLANT
UNDERPAD 30X36 HEAVY ABSORB (UNDERPADS AND DIAPERS) ×3 IMPLANT
WATER STERILE IRR 1000ML POUR (IV SOLUTION) ×3 IMPLANT

## 2021-03-28 NOTE — Interval H&P Note (Signed)
History and Physical Interval Note:  03/28/2021 7:28 AM  Kathy Frank  has presented today for surgery, with the diagnosis of NON-HEALING AMPUTATION SITE.  The various methods of treatment have been discussed with the patient and family. After consideration of risks, benefits and other options for treatment, the patient has consented to  Procedure(s): REVISION OF RIGHT BELOW KNEE AMPUTATION (Right) INCISION AND DRAINAGE OF RIGHT BELOW KNEE AMPUTATION (Right) as a surgical intervention.  The patient's history has been reviewed, patient examined, no change in status, stable for surgery.  I have reviewed the patient's chart and labs.  Questions were answered to the patient's satisfaction.     Annamarie Major  I discussed that I would not proceed with AKA even if the tissue is not viable. She understands that she is at risk for needing a AKA  Wells Isack Lavalley

## 2021-03-28 NOTE — Anesthesia Procedure Notes (Signed)
Procedure Name: LMA Insertion Date/Time: 03/28/2021 7:52 AM Performed by: Lance Coon, CRNA Pre-anesthesia Checklist: Patient identified, Emergency Drugs available, Patient being monitored, Suction available and Timeout performed Patient Re-evaluated:Patient Re-evaluated prior to induction Oxygen Delivery Method: Circle system utilized Preoxygenation: Pre-oxygenation with 100% oxygen Induction Type: IV induction LMA: LMA inserted LMA Size: 3.0 Number of attempts: 1 Placement Confirmation: positive ETCO2 and breath sounds checked- equal and bilateral Tube secured with: Tape Dental Injury: Teeth and Oropharynx as per pre-operative assessment

## 2021-03-28 NOTE — Anesthesia Preprocedure Evaluation (Signed)
Anesthesia Evaluation  Patient identified by MRN, date of birth, ID band Patient awake    Reviewed: Allergy & Precautions, NPO status , Patient's Chart, lab work & pertinent test results  Airway Mallampati: II  TM Distance: >3 FB Neck ROM: Full    Dental  (+) Missing, Poor Dentition   Pulmonary neg pulmonary ROS, former smoker,    Pulmonary exam normal        Cardiovascular hypertension, Pt. on medications + Peripheral Vascular Disease   Rhythm:Regular Rate:Normal     Neuro/Psych  Headaches, Anxiety Depression    GI/Hepatic Neg liver ROS, PUD, GERD  ,  Endo/Other  diabetes, Poorly Controlled, Type 2, Insulin Dependent  Renal/GU Dialysis and ESRFRenal disease  negative genitourinary   Musculoskeletal negative musculoskeletal ROS (+)   Abdominal (+)  Abdomen: soft. Bowel sounds: normal.  Peds  Hematology  (+) anemia , HIV,   Anesthesia Other Findings   Reproductive/Obstetrics                             Anesthesia Physical  Anesthesia Plan  ASA: 3  Anesthesia Plan: General   Post-op Pain Management:    Induction: Intravenous  PONV Risk Score and Plan: 3 and Ondansetron, Dexamethasone and Midazolam  Airway Management Planned: LMA  Additional Equipment: None  Intra-op Plan:   Post-operative Plan: Extubation in OR  Informed Consent: I have reviewed the patients History and Physical, chart, labs and discussed the procedure including the risks, benefits and alternatives for the proposed anesthesia with the patient or authorized representative who has indicated his/her understanding and acceptance.     Dental advisory given  Plan Discussed with: CRNA  Anesthesia Plan Comments:         Anesthesia Quick Evaluation

## 2021-03-28 NOTE — Transfer of Care (Signed)
Immediate Anesthesia Transfer of Care Note  Patient: Kathy Frank  Procedure(s) Performed: REVISION OF RIGHT BELOW KNEE AMPUTATION (Right: Knee)  Patient Location: PACU  Anesthesia Type:General  Level of Consciousness: drowsy  Airway & Oxygen Therapy: Patient Spontanous Breathing  Post-op Assessment: Report given to RN and Post -op Vital signs reviewed and stable  Post vital signs: Reviewed and stable  Last Vitals:  Vitals Value Taken Time  BP 123/67 03/28/21 0938  Temp    Pulse 83 03/28/21 0941  Resp 21 03/28/21 0941  SpO2 95 % 03/28/21 0941  Vitals shown include unvalidated device data.  Last Pain:  Vitals:   03/28/21 0617  TempSrc:   PainSc: 0-No pain         Complications: No notable events documented.

## 2021-03-28 NOTE — H&P (Deleted)
[facility] Renal Consultation Note    Indication for Consultation:  Management of ESRD/hemodialysis; anemia, hypertension/volume and secondary hyperparathyroidism PCP:  HPI: Kathy Frank is a 45 y.o. female with ESRD on hemodialysis T,Th,S at [facility]. Last HD 03/25/2021, left HD 1.4 kg above OP EDW. Some truncated treatments.  PMH:  DM, HTN. HIV, S/P L BKA, R BKA, DKA, gastroparesis, diabetic retinopathy,  erosive esophagitis, AOCD, SHPT. Former smoker.  She was admitted today per Dr. Trula Slade for non-healing amputation site. She is S/P revision of R BKA. Seen in PACU, appears comfortable, denies pain/SOB. BP controlled. Says she'd rather wait and have dialysis tomorrow on schedule.     Past Medical History:  Diagnosis Date   Anal abscess    chronic   Anxiety    CKD (chronic kidney disease) stage 5, GFR less than 15 ml/min (HCC) 06/19/2016   . Suspect etiologies to include diabetes and hypertension though cannot exclude HIV-associated nephropathy. Advised her against from using excessive Goody powder.   Constipation    trouble stoll coming out   Depression 06/28/2006   Qualifier: Diagnosis of  By: Riccardo Dubin MD, Todd     Diabetes mellitus type 2 in obese ([employer]) 06/28/1994   DKA (diabetic ketoacidoses) 12/2014   Dyspnea    uses oxygeb 2 liters per minute at dialysis   End stage renal disease on dialysis [facility])    Erosive esophagitis    Esophageal reflux    ESRD on hemodialysis ([place]) 05/02/2019   Eye redness    Gastroparesis    ? diabetic   GERD (gastroesophageal reflux disease)    History of blood transfusion 2019   History of hypertension    off bp meds 2020 due to low bp with dialysis   Human immunodeficiency virus (HIV) disease ([place]) 04/23/2016   Hypertension    Metabolic bone disease [id number]   Moderate nonproliferative diabetic retinopathy of both eyes ([place]) 11/21/2014   11/14/14: Noted on retinal imaging; needs follow-up imaging in 6  months  05/22/16: Noted on retinal imaging again; needs follow-up imaging in 6 months   Normocytic anemia    Pruritus, unspecified 05/20/2019   Renal failure (ARF), acute on chronic ([place]) 11/24/2018   tues, thurs sat [address] hs since sept 2020   Shortness of breath 07/01/2019   when lays doan at night, musy prop head up on 2 regular and 2 small pillows   Type 2 diabetes mellitus with diabetic peripheral angiopathy without gangrene ([place]) 05/01/2019   Type II diabetes mellitus ([place]) 1994   diagnosed around 1994   Wears dentures    lower   Wound dehiscence    Wound infection s/p L transmetatarsal amputation    Past Surgical History:  Procedure Laterality Date   ABDOMINAL AORTOGRAM W/LOWER EXTREMITY N/A 12/13/2020   Procedure: ABDOMINAL AORTOGRAM W/LOWER EXTREMITY;  Surgeon: Angelia Mould, MD;  Location: [place] [facility];  Service: Cardiovascular;  Laterality: N/A;   AMPUTATION Left 07/08/2018   Procedure: AMPUTATION FORTH RAY LEFT FOOT;  Surgeon: [name], MD;  Location: [place];  Service: Orthopedics;  Laterality: Left;   AMPUTATION Left 08/09/2018   Procedure: Left Transmetatarsal Amputation;  Surgeon: [name], MD;  Location: [place];  Service: Orthopedics;  Laterality: Left;   AMPUTATION Left 10/08/2018   Procedure: LEFT BELOW KNEE AMPUTATION;  Surgeon: [name], MD;  Location: [place];  Service: Orthopedics;  Laterality: Left;   AMPUTATION Left 10/28/2018   Procedure: REVISION BELOW KNEE  AMPUTATION;  Surgeon: [name], MD;  Location: [place];  Service: Orthopedics;  Laterality: Left;   AMPUTATION Left 12/16/2018   Procedure: LEFT ABOVE KNEE AMPUTATION;  Surgeon: [name], MD;  Location: [place];  Service: Orthopedics;  Laterality: Left;   AMPUTATION Right 01/31/2021   Procedure: AMPUTATION BELOW KNEE;  Surgeon: Serafina Mitchell, MD;  Location: [place];  Service: Vascular;  Laterality: Right;   AV FISTULA PLACEMENT Left 10/14/2018   Procedure: Arteriovenous  (Av) Fistula Creation Left Arm;  Surgeon: [name], MD;  Location: [place];  Service: Vascular;  Laterality: Left;   [place] Left 04/14/2019   Procedure: BASILIC VEIN TRANSPOSITION SECOND STAGE LEFT ARM;  Surgeon: Rosetta Posner, MD;  Location: [place];  Service: Vascular;  Laterality: Left;   INCISION AND DRAINAGE ABSCESS N/A 10/24/2020   Procedure: exicision of hydradenitis;  Surgeon: Leighton Ruff, MD;  Location: [place];  Service: General;  Laterality: N/A;  45 min   INSERTION OF DIALYSIS CATHETER Right 04/14/2019   Procedure: INSERTION OF DIALYSIS CATHETER;  Surgeon: Rosetta Posner, MD;  Location: [place];  Service: Vascular;  Laterality: Right;   LOWER EXTREMITY ANGIOGRAPHY N/A 07/05/2018   Procedure: LOWER EXTREMITY ANGIOGRAPHY;  Surgeon: Serafina Mitchell, MD;  Location: [place] [facility];  Service: Cardiovascular;  Laterality: N/A;   PERIPHERAL VASCULAR ATHERECTOMY Right 12/13/2020   Procedure: PERIPHERAL VASCULAR ATHERECTOMY;  Surgeon: Angelia Mould, MD;  Location: [place] [facility];  Service: Cardiovascular;  Laterality: Right;  Superficial femoral   PERIPHERAL VASCULAR BALLOON ANGIOPLASTY Left 07/05/2018   Procedure: PERIPHERAL VASCULAR BALLOON ANGIOPLASTY;  Surgeon: Serafina Mitchell, MD;  Location: [place] [facility];  Service: Cardiovascular;  Laterality: Left;  SFA   PERIPHERAL VASCULAR BALLOON ANGIOPLASTY Right 12/13/2020   Procedure: PERIPHERAL VASCULAR BALLOON ANGIOPLASTY;  Surgeon: Angelia Mould, MD;  Location: [place] [facility];  Service: Cardiovascular;  Laterality: Right;  Peroneal artery, anterior tibial artery   RECTAL EXAM UNDER ANESTHESIA N/A 10/24/2020   Procedure: EXAM UNDER ANESTHESIA;  Surgeon: Leighton Ruff, MD;  Location: [facility];  Service: General;  Laterality: N/A;   STUMP REVISION Left 10/19/2018   Procedure: REVISION LEFT BELOW KNEE AMPUTATION;  Surgeon: [name], MD;  Location: [place];   Service: Orthopedics;  Laterality: Left;   TUBAL LIGATION  2002   Family History  Problem Relation Age of Onset   Diabetes Mother    Diabetes Brother    Diabetes Daughter    Diabetes Daughter    Mental retardation Brother        died from PNA   Diabetes Maternal Grandmother    Social History:  reports that she quit smoking about 6 years ago. Her smoking use included cigarettes. She started smoking about 7 years ago. She has a 1.00 pack-year smoking history. She has never used smokeless tobacco. She reports that she does not currently use alcohol. She reports that she does not currently use drugs after having used the following drugs: Marijuana. No Known Allergies Prior to Admission medications   Medication Sig Start Date End Date Taking? Authorizing Provider  albuterol (VENTOLIN HFA) 108 (90 Base) MCG/ACT inhaler Inhale 2 puffs into the lungs every 4 (four) hours as needed for wheezing or shortness of breath. 02/14/21  Yes Angiulli, Lavon Paganini, PA-C  amLODipine (NORVASC) 5 MG tablet Take 1 tablet (5 mg total) by mouth daily. 02/14/21  Yes Angiulli, Lavon Paganini, PA-C  aspirin EC 81 MG tablet Take 1 tablet (81 mg total) by mouth daily. Swallow whole. 12/17/20  Yes Angelia Mould, MD  atorvastatin (LIPITOR) 40 MG tablet Take 1 tablet (40 mg total) by mouth daily. 02/14/21  Yes Angiulli, Lavon Paganini, PA-C  bictegravir-emtricitabine-tenofovir AF (BIKTARVY) 50-200-25 MG TABS tablet TAKE 1 TABLET BY MOUTH DAILY. 02/17/21 02/17/22 Yes Michel Bickers, MD  calcitRIOL (ROCALTROL) 0.25 MCG capsule Take 5 capsules (1.25 mcg total) by mouth daily. 02/14/21  Yes Angiulli, Lavon Paganini, PA-C  calcium acetate (PHOSLO) 667 MG capsule Take 3 capsules (2,001 mg total) by mouth 3 (three) times daily with meals. 02/14/21  Yes Angiulli, Lavon Paganini, PA-C  clopidogrel (PLAVIX) 75 MG tablet Take 1 tablet (75 mg total) by mouth daily. 02/14/21  Yes Angiulli, Lavon Paganini, PA-C  gabapentin (NEURONTIN) 100 MG capsule Take 1 capsule (100 mg  total) by mouth 3 (three) times a week. Patient taking differently: Take 100-300 mg by mouth See admin instructions. Take 100 mg on Monday, Wednesday, Friday and Sunday 02/14/21  Yes Angiulli, Lavon Paganini, PA-C  insulin glargine (LANTUS SOLOSTAR) 100 UNIT/ML Solostar Pen Inject 8 Units into the skin at bedtime. Patient taking differently: Inject 10 Units into the skin at bedtime. 02/14/21  Yes Angiulli, Lavon Paganini, PA-C  multivitamin (RENA-VIT) TABS tablet Take 1 tablet by mouth daily. 02/14/21  Yes Angiulli, Lavon Paganini, PA-C  pantoprazole (PROTONIX) 40 MG tablet Take 1 tablet (40 mg total) by mouth daily. 02/14/21  Yes Angiulli, Lavon Paganini, PA-C  acetaminophen (TYLENOL) 325 MG tablet Take 2 tablets (650 mg total) by mouth every 6 (six) hours as needed for mild pain (or Fever >/= 101). Patient not taking: Reported on 03/24/2021 02/14/21   Angiulli, Lavon Paganini, PA-C  Insulin Pen Needle (PENTIPS) 32G X 4 MM MISC Use as directed 02/14/21   Meredith Staggers, MD  polyethylene glycol (MIRALAX / GLYCOLAX) 17 g packet Take 17 g by mouth 2 (two) times daily. Patient taking differently: Take 17 g by mouth daily as needed for mild constipation or moderate constipation. 02/14/21   Angiulli, Lavon Paganini, PA-C  sertraline (ZOLOFT) 50 MG tablet Take 1 tablet (50 mg total) by mouth at bedtime. 02/14/21   Angiulli, Lavon Paganini, PA-C   Current Facility-Administered Medications  Medication Dose Route Frequency Provider Last Rate Last Admin   0.9 %  sodium chloride infusion   Intravenous Continuous Effie Berkshire, MD   New Bag at 03/28/21 0700   0.9 %  sodium chloride infusion   Intravenous Continuous Serafina Mitchell, MD       acetaminophen (TYLENOL) tablet 1,000 mg  1,000 mg Oral Once Suzette Battiest, MD       Chlorhexidine Gluconate Cloth 2 % PADS 6 each  6 each Topical Once Serafina Mitchell, MD       And   Chlorhexidine Gluconate Cloth 2 % PADS 6 each  6 each Topical Once Serafina Mitchell, MD       fentaNYL (SUBLIMAZE) 100 MCG/2ML  injection            fentaNYL (SUBLIMAZE) injection 25-50 mcg  25-50 mcg Intravenous Q5 min PRN Suzette Battiest, MD   25 mcg at 03/28/21 1036   insulin aspart (novoLOG) 100 UNIT/ML injection            promethazine (PHENERGAN) injection 6.25-12.5 mg  6.25-12.5 mg Intravenous Q15 min PRN Suzette Battiest, MD       Labs: Basic Metabolic Panel: Recent Labs  Lab 03/28/21  0546  NA 136  K 4.5  CL 96*  CO2 23  GLUCOSE 150*  BUN 54*  CREATININE 8.72*  CALCIUM 8.6*   Liver Function Tests: Recent Labs  Lab 03/28/21 0546  AST 13*  ALT 11  ALKPHOS 59  BILITOT 0.6  PROT 8.1  ALBUMIN 2.9*   No results for input(s): LIPASE, AMYLASE in the last 168 hours. No results for input(s): AMMONIA in the last 168 hours. CBC: Recent Labs  Lab 03/28/21 0546  WBC 9.4  HGB 9.8*  HCT 30.6*  MCV 96.2  PLT 416*   Cardiac Enzymes: No results for input(s): CKTOTAL, CKMB, CKMBINDEX, TROPONINI in the last 168 hours. CBG: Recent Labs  Lab 03/28/21 0608 03/28/21 0953  GLUCAP 156* 236*   Iron Studies: No results for input(s): IRON, TIBC, TRANSFERRIN, FERRITIN in the last 72 hours. Studies/Results: No results found.  ROS: As per HPI otherwise negative.   Physical Exam: Vitals:   03/28/21 0939 03/28/21 0954 03/28/21 1009 03/28/21 1039  BP: 123/67 122/66 116/62 122/71  Pulse: 83 83 84 85  Resp: (!) '22 13 15 16  '$ Temp: 97.8 F (36.6 C)     TempSrc:      SpO2: 93% 99% 98% 99%     General: Chronically ill appearing female in NAD in no acute distress. Head: Normocephalic, atraumatic, sclera non-icteric, mucus membranes are moist Neck: Supple. JVD not elevated. Lungs: Clear bilaterally to auscultation without wheezes, rales, or rhonchi. Breathing is unlabored. Heart: RRR with S1 S2. No murmurs, rubs, or gallops appreciated. Abdomen: Soft, non-tender, non-distended with normoactive bowel sounds. No rebound/guarding. No obvious abdominal masses.. Lower extremities: L BKA no edema. R BKA  with stump protector in place.  Neuro: Alert and oriented X 3. Moves all extremities spontaneously. Psych:  Responds to questions appropriately with a normal affect. Dialysis Access: L AVF aneurysmal, + T/B. R EJ IV drsg intact  Dialysis Orders: [facility] T,Th,S 4 hrs 180NRe 400/Autoflow 1.5 69 kg 2.0 K/ 2.0 Ca L AVF -Heparin 3000 units IV TIW -Mircera 150 mcg IV q 2 weeks (last dose 03/20/21 Last HGB 9.7 03/20/21) -Venofer 50 mg IV weekly -Calcitriol 0.25 mcg PO TIW  Assessment/Plan:  R Nonhealing BKA Wound-Revision of R BKA-per [facility]. Stable in PACU.   ESRD -  T,Th,S missed HD 03/27/21. No acute HD needs today. HD tomorrow on schedule. No heparin.   Hypertension/volume  - BP well controlled. R UF as tolerated. Resume amlodipine 5 mg PO q HS when able.   Anemia  - HGB 9.8. Monitor post OP downward trend. ESA not due yet.   Metabolic bone disease -  Last OP Ca/PTH at goal. PO4 slightly elevated. Continue VDRA/binders.   Nutrition - NPO at present. Renal/Carb diet when able to eat. Albumin 2.9.  DMT2-per priimary HIV-meds per primary  Nguyet Mercer H. Owens Shark, NP-C 03/28/2021, 11:01 AM  [employer] [phone]

## 2021-03-28 NOTE — Progress Notes (Signed)
Patient came in short stay this AM for surgery and her BP was elevated. 230/98 and rechecked was 236/107. Patient verbalized that she had her Amlodipine this AM. Pulse in normal range. Per Dr. Deatra Canter, patient received at 07:07 o'clock 10 mg Hydralazine IV and 10 mg Labetolol IV. BP came down to 184/84. MD notified.

## 2021-03-28 NOTE — Progress Notes (Signed)
Orthopedic Tech Progress Note Patient Details:  Kathy Frank 04/05/1976 [id number] Called order into [facility] Patient ID: Kathy Frank, female   DOB: 1975/10/31, 45 y.o.   MRN: [id number]  [name] 03/28/2021, 2:53 PM

## 2021-03-28 NOTE — Op Note (Signed)
    Patient name: Kathy Frank MRN: [id number] DOB: 1975/08/29 Sex: female  03/28/2021 Pre-operative Diagnosis: Infected right below-knee amputation Post-operative diagnosis:  Same Surgeon:  Annamarie Major Assistants: Laurence Slate Procedure:   Revision of right below-knee amputation via redo below-knee amputation Anesthesia: General Blood Loss: 200 Specimens:  none needed  Findings: Healthy appearing most likely amputation site with no evidence of infection  Indications: This is a 45 year old female who recently underwent a right below-knee amputation which has not healed and has a foul odor to it.  I discussed revision AKA.  She is not interested in amputation above-knee at this time.  She understands that that is a possibility however wants to try to salvage the knee.  Procedure:  The patient was identified in the holding area and taken to [place] [address]  The patient was then placed supine on the table. general anesthesia was administered.  The patient was prepped and draped in the usual sterile fashion.  A time out was called and antibiotics were administered.  A fishmouth type incision was made incorporating the necrotic previous incision.  Cautery was used to circumferentially expose the tibia and fibula.  A reciprocating saw was used to transect the tibia and then the fibula was cut proximal to the cut edge of the tibia.  Muscle was divided with cautery.  I dissected out the neurovascular bundle which was heavily scarred and and ligated as high up as I could go hoping to get proximal to the cut edge of the tibia.  The remaining portion of the stump was removed.  The wound was irrigated.  Hemostasis was achieved.  There was too much muscle for appropriate skin closure and so I divided some of the posterior flap muscle.  I again irrigated and hemostasis.  The fascia was reapproximated 2-0 Vicryl and skin was closed staples.  Sterile dressings were applied.  There were no immediate  complications.   Disposition: PACU stable.   Theotis Burrow, M.D., [facility] [place] Office: [phone] Pager:  [phone]

## 2021-03-29 LAB — BASIC METABOLIC PANEL
Anion gap: 17 — ABNORMAL HIGH (ref 5–15)
BUN: 71 mg/dL — ABNORMAL HIGH (ref 6–20)
CO2: 21 mmol/L — ABNORMAL LOW (ref 22–32)
Calcium: 8.1 mg/dL — ABNORMAL LOW (ref 8.9–10.3)
Chloride: 90 mmol/L — ABNORMAL LOW (ref 98–111)
Creatinine, Ser: 9.64 mg/dL — ABNORMAL HIGH (ref 0.44–1.00)
GFR, Estimated: 5 mL/min — ABNORMAL LOW (ref >60)
Glucose, Bld: 398 mg/dL — ABNORMAL HIGH (ref 70–99)
Potassium: 4.9 mmol/L (ref 3.5–5.1)
Sodium: 128 mmol/L — ABNORMAL LOW (ref 135–145)

## 2021-03-29 LAB — GLUCOSE, CAPILLARY
Glucose-Capillary: 274 mg/dL — ABNORMAL HIGH (ref 70–99)
Glucose-Capillary: 185 mg/dL — ABNORMAL HIGH (ref 70–99)
Glucose-Capillary: 202 mg/dL — ABNORMAL HIGH (ref 70–99)
Glucose-Capillary: 213 mg/dL — ABNORMAL HIGH (ref 70–99)

## 2021-03-29 LAB — CBC
HCT: 24.1 % — ABNORMAL LOW (ref 36.0–46.0)
Hemoglobin: 7.6 g/dL — ABNORMAL LOW (ref 12.0–15.0)
MCH: 29.3 pg (ref 26.0–34.0)
MCHC: 31.5 g/dL (ref 30.0–36.0)
MCV: 93.1 fL (ref 80.0–100.0)
Platelets: 391 10*3/uL (ref 150–400)
RBC: 2.59 MIL/uL — ABNORMAL LOW (ref 3.87–5.11)
RDW: 17.8 % — ABNORMAL HIGH (ref 11.5–15.5)
WBC: 11.5 10*3/uL — ABNORMAL HIGH (ref 4.0–10.5)
nRBC: 0 % (ref 0.0–0.2)

## 2021-03-29 LAB — HEPATITIS B SURFACE ANTIGEN: Hepatitis B Surface Ag: NONREACTIVE

## 2021-03-29 MED ORDER — HYDROCODONE-ACETAMINOPHEN 5-325 MG PO TABS
ORAL_TABLET | ORAL | Status: AC
  Filled 2021-03-29: qty 2

## 2021-03-29 MED ORDER — MORPHINE SULFATE (PF) 2 MG/ML IV SOLN
INTRAVENOUS | Status: AC
  Administered 2021-03-29: 1 mg via INTRAVENOUS
  Filled 2021-03-29: qty 1

## 2021-03-29 MED ORDER — CALCITRIOL 0.25 MCG PO CAPS
ORAL_CAPSULE | ORAL | Status: AC
  Filled 2021-03-29: qty 5

## 2021-03-29 NOTE — Consult Note (Deleted)
[facility] Renal Consultation Note    I Attestation signed by Madelon Lips, MD at 03/28/2021  3:54 PM   I have personally seen and examined this patient and agree with the assessment/plan as outlined below.   S/p R BKA revision earlier this AM.     Sleepy but arousable and appropriate.  No reports of pain.  HD tomorrow on her regular schedule.   [name] 03/28/2021 3:52 PM         Expand All Collapse All                                                                                                                                                                                                                                                                            [facility] Renal Consultation Note    Indication for Consultation:  Management of ESRD/hemodialysis; anemia, hypertension/volume and secondary hyperparathyroidism PCP:   HPI: Kathy Frank is a 45 y.o. female with ESRD on hemodialysis T,Th,S at [facility]. Last HD 03/25/2021, left HD 1.4 kg above OP EDW. Some truncated treatments.  PMH:  DM, HTN. HIV, S/P L BKA, R BKA, DKA, gastroparesis, diabetic retinopathy,  erosive esophagitis, AOCD, SHPT. Former smoker.   She was admitted today per Dr. Trula Slade for non-healing amputation site. She is S/P revision of R BKA. Seen in PACU, appears comfortable, denies pain/SOB. BP controlled. Says she'd rather wait and have dialysis tomorrow on schedule.          Past Medical History:  Diagnosis Date   Anal abscess      chronic   Anxiety     CKD (chronic kidney disease) stage 5, GFR less than 15 ml/min (HCC) 06/19/2016    . Suspect etiologies to include diabetes and hypertension though cannot exclude HIV-associated nephropathy. Advised her against from using excessive Goody powder.   Constipation      trouble stoll coming out    Depression 06/28/2006    Qualifier: Diagnosis of  By: Riccardo Dubin MD, Todd     Diabetes mellitus type 2 in obese ([place]) 06/28/1994   DKA (diabetic ketoacidoses) 12/2014   Dyspnea      uses oxygeb 2 liters per minute  at dialysis   End stage renal disease on dialysis [facility])     Erosive esophagitis     Esophageal reflux     ESRD on hemodialysis ([place]) 05/02/2019   Eye redness     Gastroparesis      ? diabetic   GERD (gastroesophageal reflux disease)     History of blood transfusion 2019   History of hypertension      off bp meds 2020 due to low bp with dialysis   Human immunodeficiency virus (HIV) disease ([place]) 04/23/2016   Hypertension     Metabolic bone disease [id number]   Moderate nonproliferative diabetic retinopathy of both eyes ([place]) 11/21/2014    11/14/14: Noted on retinal imaging; needs follow-up imaging in 6 months  05/22/16: Noted on retinal imaging again; needs follow-up imaging in 6 months   Normocytic anemia     Pruritus, unspecified 05/20/2019   Renal failure (ARF), acute on chronic ([place]) 11/24/2018    tues, thurs sat [address] hs since sept 2020   Shortness of breath 07/01/2019    when lays doan at night, musy prop head up on 2 regular and 2 small pillows   Type 2 diabetes mellitus with diabetic peripheral angiopathy without gangrene ([place]) 05/01/2019   Type II diabetes mellitus ([place]) 1994    diagnosed around 1994   Wears dentures      lower   Wound dehiscence     Wound infection s/p L transmetatarsal amputation           Past Surgical History:  Procedure Laterality Date   ABDOMINAL AORTOGRAM W/LOWER EXTREMITY N/A 12/13/2020    Procedure: ABDOMINAL AORTOGRAM W/LOWER EXTREMITY;  Surgeon: Angelia Mould, MD;  Location: [place] [facility];  Service: Cardiovascular;  Laterality: N/A;   AMPUTATION Left 07/08/2018    Procedure: AMPUTATION FORTH RAY LEFT FOOT;  Surgeon: [name], MD;  Location: [place];  Service: Orthopedics;  Laterality: Left;   AMPUTATION Left  08/09/2018    Procedure: Left Transmetatarsal Amputation;  Surgeon: [name], MD;  Location: [place];  Service: Orthopedics;  Laterality: Left;   AMPUTATION Left 10/08/2018    Procedure: LEFT BELOW KNEE AMPUTATION;  Surgeon: [name], MD;  Location: [place];  Service: Orthopedics;  Laterality: Left;   AMPUTATION Left 10/28/2018    Procedure: REVISION BELOW KNEE AMPUTATION;  Surgeon: [name], MD;  Location: [place];  Service: Orthopedics;  Laterality: Left;   AMPUTATION Left 12/16/2018    Procedure: LEFT ABOVE KNEE AMPUTATION;  Surgeon: [name], MD;  Location: [place];  Service: Orthopedics;  Laterality: Left;   AMPUTATION Right 01/31/2021    Procedure: AMPUTATION BELOW KNEE;  Surgeon: Serafina Mitchell, MD;  Location: [place];  Service: Vascular;  Laterality: Right;   AV FISTULA PLACEMENT Left 10/14/2018    Procedure: Arteriovenous (Av) Fistula Creation Left Arm;  Surgeon: [name], MD;  Location: [place];  Service: Vascular;  Laterality: Left;   [place] Left 04/14/2019    Procedure: BASILIC VEIN TRANSPOSITION SECOND STAGE LEFT ARM;  Surgeon: Rosetta Posner, MD;  Location: [place];  Service: Vascular;  Laterality: Left;   INCISION AND DRAINAGE ABSCESS N/A 10/24/2020    Procedure: exicision of hydradenitis;  Surgeon: Leighton Ruff, MD;  Location: [place];  Service: General;  Laterality: N/A;  45 min   INSERTION OF DIALYSIS CATHETER Right 04/14/2019    Procedure: INSERTION OF DIALYSIS CATHETER;  Surgeon: Rosetta Posner, MD;  [facility];  Service: Vascular;  Laterality: Right;   LOWER EXTREMITY ANGIOGRAPHY N/A 07/05/2018    Procedure: LOWER EXTREMITY ANGIOGRAPHY;  Surgeon: Serafina Mitchell, MD;  Location: [place] [facility];  Service: Cardiovascular;  Laterality: N/A;   PERIPHERAL VASCULAR ATHERECTOMY Right 12/13/2020    Procedure: PERIPHERAL VASCULAR ATHERECTOMY;  Surgeon: Angelia Mould, MD;  Location: [place] [facility];  Service:  Cardiovascular;  Laterality: Right;  Superficial femoral   PERIPHERAL VASCULAR BALLOON ANGIOPLASTY Left 07/05/2018    Procedure: PERIPHERAL VASCULAR BALLOON ANGIOPLASTY;  Surgeon: Serafina Mitchell, MD;  Location: [place] [facility];  Service: Cardiovascular;  Laterality: Left;  SFA   PERIPHERAL VASCULAR BALLOON ANGIOPLASTY Right 12/13/2020    Procedure: PERIPHERAL VASCULAR BALLOON ANGIOPLASTY;  Surgeon: Angelia Mould, MD;  Location: [place] [facility];  Service: Cardiovascular;  Laterality: Right;  Peroneal artery, anterior tibial artery   RECTAL EXAM UNDER ANESTHESIA N/A 10/24/2020    Procedure: EXAM UNDER ANESTHESIA;  Surgeon: Leighton Ruff, MD;  Location: [facility];  Service: General;  Laterality: N/A;   STUMP REVISION Left 10/19/2018    Procedure: REVISION LEFT BELOW KNEE AMPUTATION;  Surgeon: [name], MD;  Location: [place];  Service: Orthopedics;  Laterality: Left;   TUBAL LIGATION   2002         Family History  Problem Relation Age of Onset   Diabetes Mother     Diabetes Brother     Diabetes Daughter     Diabetes Daughter     Mental retardation Brother          died from PNA   Diabetes Maternal Grandmother      Social History:  reports that she quit smoking about 6 years ago. Her smoking use included cigarettes. She started smoking about 7 years ago. She has a 1.00 pack-year smoking history. She has never used smokeless tobacco. She reports that she does not currently use alcohol. She reports that she does not currently use drugs after having used the following drugs: Marijuana. No Known Allergies        Prior to Admission medications   Medication Sig Start Date End Date Taking? Authorizing Provider  albuterol (VENTOLIN HFA) 108 (90 Base) MCG/ACT inhaler Inhale 2 puffs into the lungs every 4 (four) hours as needed for wheezing or shortness of breath. 02/14/21   Yes Angiulli, Lavon Paganini, PA-C  amLODipine (NORVASC) 5 MG tablet Take 1 tablet (5 mg total) by  mouth daily. 02/14/21   Yes Angiulli, Lavon Paganini, PA-C  aspirin EC 81 MG tablet Take 1 tablet (81 mg total) by mouth daily. Swallow whole. 12/17/20   Yes Angelia Mould, MD  atorvastatin (LIPITOR) 40 MG tablet Take 1 tablet (40 mg total) by mouth daily. 02/14/21   Yes Angiulli, Lavon Paganini, PA-C  bictegravir-emtricitabine-tenofovir AF (BIKTARVY) 50-200-25 MG TABS tablet TAKE 1 TABLET BY MOUTH DAILY. 02/17/21 02/17/22 Yes Michel Bickers, MD  calcitRIOL (ROCALTROL) 0.25 MCG capsule Take 5 capsules (1.25 mcg total) by mouth daily. 02/14/21   Yes Angiulli, Lavon Paganini, PA-C  calcium acetate (PHOSLO) 667 MG capsule Take 3 capsules (2,001 mg total) by mouth 3 (three) times daily with meals. 02/14/21   Yes Angiulli, Lavon Paganini, PA-C  clopidogrel (PLAVIX) 75 MG tablet Take 1 tablet (75 mg total) by mouth daily. 02/14/21   Yes Angiulli, Lavon Paganini, PA-C  gabapentin (NEURONTIN) 100 MG capsule Take 1 capsule (100 mg total) by mouth 3 (three) times a week. Patient taking  differently: Take 100-300 mg by mouth See admin instructions. Take 100 mg on Monday, Wednesday, Friday and Sunday 02/14/21   Yes Angiulli, Lavon Paganini, PA-C  insulin glargine (LANTUS SOLOSTAR) 100 UNIT/ML Solostar Pen Inject 8 Units into the skin at bedtime. Patient taking differently: Inject 10 Units into the skin at bedtime. 02/14/21   Yes Angiulli, Lavon Paganini, PA-C  multivitamin (RENA-VIT) TABS tablet Take 1 tablet by mouth daily. 02/14/21   Yes Angiulli, Lavon Paganini, PA-C  pantoprazole (PROTONIX) 40 MG tablet Take 1 tablet (40 mg total) by mouth daily. 02/14/21   Yes Angiulli, Lavon Paganini, PA-C  acetaminophen (TYLENOL) 325 MG tablet Take 2 tablets (650 mg total) by mouth every 6 (six) hours as needed for mild pain (or Fever >/= 101). Patient not taking: Reported on 03/24/2021 02/14/21     Angiulli, Lavon Paganini, PA-C  Insulin Pen Needle (PENTIPS) 32G X 4 MM MISC Use as directed 02/14/21     Meredith Staggers, MD  polyethylene glycol (MIRALAX / GLYCOLAX) 17 g packet Take 17 g by mouth 2  (two) times daily. Patient taking differently: Take 17 g by mouth daily as needed for mild constipation or moderate constipation. 02/14/21     Angiulli, Lavon Paganini, PA-C  sertraline (ZOLOFT) 50 MG tablet Take 1 tablet (50 mg total) by mouth at bedtime. 02/14/21     Angiulli, Lavon Paganini, PA-C             Current Facility-Administered Medications  Medication Dose Route Frequency Provider Last Rate Last Admin   0.9 %  sodium chloride infusion   Intravenous Continuous Effie Berkshire, MD   New Bag at 03/28/21 0700   0.9 %  sodium chloride infusion   Intravenous Continuous Serafina Mitchell, MD       acetaminophen (TYLENOL) tablet 1,000 mg  1,000 mg Oral Once Suzette Battiest, MD       Chlorhexidine Gluconate Cloth 2 % PADS 6 each  6 each Topical Once Serafina Mitchell, MD        And   Chlorhexidine Gluconate Cloth 2 % PADS 6 each  6 each Topical Once Serafina Mitchell, MD       fentaNYL (SUBLIMAZE) 100 MCG/2ML injection               fentaNYL (SUBLIMAZE) injection 25-50 mcg  25-50 mcg Intravenous Q5 min PRN Suzette Battiest, MD   25 mcg at 03/28/21 1036   insulin aspart (novoLOG) 100 UNIT/ML injection               promethazine (PHENERGAN) injection 6.25-12.5 mg  6.25-12.5 mg Intravenous Q15 min PRN Suzette Battiest, MD        Labs: Basic Metabolic Panel: Last Labs      Recent Labs  Lab 03/28/21 0546  NA 136  K 4.5  CL 96*  CO2 23  GLUCOSE 150*  BUN 54*  CREATININE 8.72*  CALCIUM 8.6*      Liver Function Tests: Last Labs      Recent Labs  Lab 03/28/21 0546  AST 13*  ALT 11  ALKPHOS 59  BILITOT 0.6  PROT 8.1  ALBUMIN 2.9*      Last Labs   No results for input(s): LIPASE, AMYLASE in the last 168 hours.   Last Labs   No results for input(s): AMMONIA in the last 168 hours.   CBC: Last Labs      Recent Labs  Lab 03/28/21 0546  WBC 9.4  HGB 9.8*  HCT 30.6*  MCV 96.2  PLT 416*      Cardiac Enzymes: Last Labs   No results for input(s): CKTOTAL, CKMB, CKMBINDEX,  TROPONINI in the last 168 hours.   CBG: Last Labs       Recent Labs  Lab 03/28/21 0608 03/28/21 0953  GLUCAP 156* 236*      Iron Studies:  Recent Labs (last 2 labs)   No results for input(s): IRON, TIBC, TRANSFERRIN, FERRITIN in the last 72 hours.   Studies/Results: Imaging Results (Last 48 hours)  No results found.     ROS: As per HPI otherwise negative.     Physical Exam:       Vitals:    03/28/21 0939 03/28/21 0954 03/28/21 1009 03/28/21 1039  BP: 123/67 122/66 116/62 122/71  Pulse: 83 83 84 85  Resp: (!) '22 13 15 16  '$ Temp: 0000000 F (36.6 C)        TempSrc:          SpO2: 93% 99% 98% 99%     General: Chronically ill appearing female in NAD in no acute distress. Head: Normocephalic, atraumatic, sclera non-icteric, mucus membranes are moist Neck: Supple. JVD not elevated. Lungs: Clear bilaterally to auscultation without wheezes, rales, or rhonchi. Breathing is unlabored. Heart: RRR with S1 S2. No murmurs, rubs, or gallops appreciated. Abdomen: Soft, non-tender, non-distended with normoactive bowel sounds. No rebound/guarding. No obvious abdominal masses.. Lower extremities: L BKA no edema. R BKA with stump protector in place.  Neuro: Alert and oriented X 3. Moves all extremities spontaneously. Psych:  Responds to questions appropriately with a normal affect. Dialysis Access: L AVF aneurysmal, + T/B. R EJ IV drsg intact   Dialysis Orders: [facility] T,Th,S 4 hrs 180NRe 400/Autoflow 1.5 69 kg 2.0 K/ 2.0 Ca L AVF -Heparin 3000 units IV TIW -Mircera 150 mcg IV q 2 weeks (last dose 03/20/21 Last HGB 9.7 03/20/21) -Venofer 50 mg IV weekly -Calcitriol 0.25 mcg PO TIW   Assessment/Plan:  R Nonhealing BKA Wound-Revision of R BKA-per VVS. Stable in PACU.   ESRD -  T,Th,S missed HD 03/27/21. No acute HD needs today. HD tomorrow on schedule. No heparin.   Hypertension/volume  - BP well controlled. R UF as tolerated. Resume amlodipine 5 mg PO q HS when able.   Anemia  - HGB 9.8.  Monitor post OP downward trend. ESA not due yet.   Metabolic bone disease -  Last OP Ca/PTH at goal. PO4 slightly elevated. Continue VDRA/binders.   Nutrition - NPO at present. Renal/Carb diet when able to eat. Albumin 2.9.  DMT2-per priimary HIV-meds per primary   Jayren Cease H. Owens Shark, NP-C 03/28/2021, 11:01 AM  [employer] [phone]            Cosigned by: Madelon Lips, MD at 03/28/2021  3:54 PM   Revision History                               Routing History                 Note Details  Author Valentina Gu, NP File Time 03/28/2021  3:54 PM  Author Type Nurse Practitioner Status Addendum  Last Editor Valentina Gu, NP Service Nephrology  Hospital Acct # [id number] Admit Date 03/28/2021

## 2021-03-29 NOTE — Progress Notes (Signed)
OT Cancellation Note  Patient Details Name: Kathy Frank MRN: [id number] DOB: [birth year]   Cancelled Treatment:    Reason Eval/Treat Not Completed: Patient at procedure or test/ unavailable Pt is currently off the floor at HD. OT will return as time allows and pt is appropriate.   [facility] OTR/L Acute Rehabilitation Services Office: [place] 03/29/2021, 10:26 AM

## 2021-03-29 NOTE — Evaluation (Signed)
Physical Therapy Evaluation Patient Details Name: Kathy Frank MRN: [id number] DOB: 07-01-1976 Today's Date: 03/29/2021   History of Present Illness  Kinda Oen is a 45 y.o. 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 Trula Slade, which has not healed. Pt is s/p redo right BKA 03/28/21.  PMH includes anxiety, ESRD on HD, depression, HTN, HIV, type 2 DM, L AKA.   Clinical Impression  Pt in bed upon arrival of PT, agreeable to evaluation at this time. Prior to admission the pt was completing transfers between bed, WC, and BSC independently, and was able to complete ADLs and some IADLs from [facility] level. The pt was limited during today's session due to fatigue from HD this AM, but was able to demo multiple supine-sit transfers and complete scooting in all directions in the bed to allow for changing of sheets and repositioning. The pt declined OOB transfers at this time due to fatigue, but will continue to benefit from skilled PT to practice transfers and facilitate return to pt being able to safely complete transfers on independent level. Anticipate no follow up therapies needed given the pt's prior level of independence and current mobility.      Follow Up Recommendations No PT follow up;Supervision for mobility/OOB    Equipment Recommendations  None recommended by PT    Recommendations for Other Services       Precautions / Restrictions Precautions Precautions: Fall Precaution Comments: L AKA, R BKA Required Braces or Orthoses: Other Brace Other Brace: R limb protector Restrictions Weight Bearing Restrictions: Yes RLE Weight Bearing: Non weight bearing      Mobility  Bed Mobility Overal bed mobility: Independent             General bed mobility comments: pt able to complete supine to long sit and scoot in all directions in bed without assist.    Transfers                 General transfer comment: deferred due to pt reports of fatigue following HD  Ambulation/Gait              General Gait Details: NT, pt not ambulatory at baseline          Balance Overall balance assessment: Modified Independent                                           Pertinent Vitals/Pain Pain Assessment: 0-10 Pain Score: 7  Pain Location: LLE Pain Descriptors / Indicators: Aching;Throbbing Pain Intervention(s): Monitored during session;Repositioned;Premedicated before session;Limited activity within patient's tolerance    Home Living Family/patient expects to be discharged to:: Other (Comment) (motel room) Living Arrangements: Children               Additional Comments: pt reports manual WC wont fit in bathroom at motel. has BSC and slide board she uses to get into car. transfers to [facility] with lateral scoot.    Prior Function Level of Independence: Independent with assistive device(s)         Comments: uses WC for mobility, slide board to get into car, but trasnfers to [facility] independently. uses BSC, reports daughter does all IADLs.     Hand Dominance   Dominant Hand: Right    Extremity/Trunk Assessment   Upper Extremity Assessment Upper Extremity Assessment: Defer to OT evaluation    Lower Extremity Assessment Lower Extremity Assessment:  RLE deficits/detail;LLE deficits/detail RLE Deficits / Details: BKA, pt able to perform SLR against gravity but fatigues after 4-5 reps RLE: Unable to fully assess due to immobilization LLE Deficits / Details: chronic AKA, pt able to use functionally for scooting and complete full AROM    Cervical / Trunk Assessment Cervical / Trunk Assessment: Normal  Communication   Communication: No difficulties  Cognition Arousal/Alertness: Awake/alert Behavior During Therapy: WFL for tasks assessed/performed Overall Cognitive Status: Within Functional Limits for tasks assessed                                        General Comments General comments (skin integrity, edema, etc.): VSS,  pt limb protector wet upon arrival of PT/OT, dried during session with no evidence of leaking from BKA site. Pt unsure if she spilled near her limb protector     PT Assessment Patient needs continued PT services  PT Problem List Decreased strength;Decreased range of motion;Decreased activity tolerance;Decreased balance;Decreased mobility       PT Treatment Interventions DME instruction;Functional mobility training;Therapeutic activities;Therapeutic exercise;Balance training;Patient/family education    PT Goals (Current goals can be found in the Care Plan section)  Acute Rehab PT Goals Patient Stated Goal: return home PT Goal Formulation: With patient Time For Goal Achievement: 04/12/21 Potential to Achieve Goals: Good    Frequency Min 3X/week   Barriers to discharge        Co-evaluation PT/OT/SLP Co-Evaluation/Treatment: Yes Reason for Co-Treatment: Other (comment) (concern for limited tolerance following HD) PT goals addressed during session: Mobility/safety with mobility;Balance         AM-PAC PT "6 Clicks" Mobility  Outcome Measure Help needed turning from your back to your side while in a flat bed without using bedrails?: None Help needed moving from lying on your back to sitting on the side of a flat bed without using bedrails?: None Help needed moving to and from a bed to a chair (including a wheelchair)?: A Little Help needed standing up from a chair using your arms (e.g., wheelchair or bedside chair)?: A Little Help needed to walk in hospital room?: Total Help needed climbing 3-5 steps with a railing? : Total 6 Click Score: 16    End of Session Equipment Utilized During Treatment: Other (comment) (R limb protector) Activity Tolerance: Patient tolerated treatment well;Patient limited by fatigue Patient left: in bed;with call bell/phone within reach;with bed alarm set Nurse Communication: Mobility status PT Visit Diagnosis: Other abnormalities of gait and mobility  (R26.89)    Time: [id number] PT Time Calculation (min) (ACUTE ONLY): 23 min   Charges:   PT Evaluation $PT Eval Low Complexity: 1 Low          Judit Awad Allen Kell, PT, DPT   Acute Rehabilitation Department Pager #: [phone]  Sandra Cockayne 03/29/2021, 1:52 PM

## 2021-03-29 NOTE — Progress Notes (Signed)
Inpatient Rehab Admissions Coordinator:  CIR consult received. PT and OT have seen pt. And are recommending no follow up. Pt. Does not appear to require intensity of CIR at this time. I will not pursue an admission and CIR will sign off at this time.   Clemens Catholic, [place], [place] Admissions Coordinator  [phone] ([place]) [phone] (office)

## 2021-03-29 NOTE — Progress Notes (Signed)
   VASCULAR SURGERY ASSESSMENT & PLAN:   POD 1 S/P REDO RIGHT BELOW THE KNEE AMPUTATION: The dressing is dry.  We will change her dressing tomorrow.  She has a knee immobilizer on.  END-STAGE RENAL DISEASE: I believe they plan to dialyze her today.  SUBJECTIVE:   Pain is well controlled.  PHYSICAL EXAM:   Vitals:   03/28/21 2026 03/29/21 0012 03/29/21 0358 03/29/21 0821  BP: 132/71 (!) 160/88 (!) 155/86 135/75  Pulse: 87 86 89 88  Resp: '16 16 16 16  '$ Temp: 97.7 F (36.5 C) 98.2 F (36.8 C) 98.1 F (36.7 C) 98.2 F (36.8 C)  TempSrc: Oral Oral Oral Oral  SpO2: 97% 97% 100% 100%  Weight:   78.6 kg    She has a knee immobilizer on.  Her dressing is dry.  LABS:   Lab Results  Component Value Date   WBC 11.5 (H) 03/29/2021   HGB 7.6 (L) 03/29/2021   HCT 24.1 (L) 03/29/2021   MCV 93.1 03/29/2021   PLT 391 03/29/2021   Lab Results  Component Value Date   CREATININE 9.64 (H) 03/29/2021   Lab Results  Component Value Date   INR 1.0 03/28/2021   CBG (last 3)  Recent Labs    03/28/21 1640 03/28/21 2112 03/29/21 0605  GLUCAP 312* 408* 274*    PROBLEM LIST:    Active Problems:   PAD (peripheral artery disease) (HCC)   CURRENT MEDS:    amLODipine  5 mg Oral Daily   aspirin EC  81 mg Oral Daily   atorvastatin  40 mg Oral Daily   bictegravir-emtricitabine-tenofovir AF  1 tablet Oral Daily   calcitRIOL  1.25 mcg Oral Daily   calcium acetate  2,001 mg Oral TID WC   Chlorhexidine Gluconate Cloth  6 each Topical Q0600   clopidogrel  75 mg Oral Daily   docusate sodium  100 mg Oral Daily   gabapentin  100 mg Oral Once per day on Mon Wed Fri   heparin  5,000 Units Subcutaneous Q8H   insulin aspart  0-6 Units Subcutaneous TID WC   insulin glargine-yfgn  8 Units Subcutaneous QHS   multivitamin  1 tablet Oral Daily   pantoprazole  40 mg Oral Daily   polyethylene glycol  17 g Oral BID   sertraline  50 mg Oral QHS   sodium chloride flush  3 mL Intravenous Q12H     Deitra Mayo Office: [phone] 03/29/2021

## 2021-03-29 NOTE — Procedures (Signed)
Patient seen and examined on Hemodialysis. BP 140/67 (BP Location: Right Arm)   Pulse 89   Temp 98.3 F (36.8 C) (Oral)   Resp 15   Wt 77.5 kg   LMP 10/23/2020 Comment: Last period in March 2022  SpO2 100%   BMI 28.43 kg/m   QB 400 mL/ min via AVG, UF goal 3L  Tolerating treatment without complaints at this time.  She does report some stump pain.   Madelon Lips MD [facility] 11:02 AM

## 2021-03-29 NOTE — Evaluation (Signed)
Occupational Therapy Evaluation Patient Details Name: Kathy Frank MRN: [id number] DOB: [birth year] Today's Date: 03/29/2021    History of Present Illness Kathy Frank is a 45 y.o. 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 Trula Slade, which has not healed. Pt is s/p redo right BKA 03/28/21.  PMH includes anxiety, ESRD on HD, depression, HTN, HIV, type 2 DM, L AKA.   Clinical Impression   PTA, pt was living at a motel with her 20y.o. daughter, pt reports she was independent with ADL and her daughter assisted with some IADL. Pt reports independence with lateral scoot transfers to/from w/c. Pt currently demonstrates modified independence with lateral scoot transfer on flat bed. Pt decline OOB transfers at this time due to fatigue.  Due to decline in current level of function, pt would benefit from acute OT to address toilet transfers and donning/doffing limb guard to facilitate safe D/C to venue listed below. At this time, do not anticipate pt to require follow-up OT. Will continue to follow acutely.     Follow Up Recommendations  No OT follow up    Equipment Recommendations  Tub/shower bench    Recommendations for Other Services       Precautions / Restrictions Precautions Precautions: Fall Precaution Comments: Limb guard RLE Required Braces or Orthoses: Other Brace Other Brace: R limb protector Restrictions Weight Bearing Restrictions: Yes RLE Weight Bearing: Non weight bearing Other Position/Activity Restrictions: limb guard on during mobility      Mobility Bed Mobility Overal bed mobility: Independent             General bed mobility comments: pt able to complete supine to long sit and scoot in all directions in bed without assist.    Transfers                 General transfer comment: deferred due to pt reports of fatigue following HD    Balance Overall balance assessment: Modified Independent                                          ADL either performed or assessed with clinical judgement   ADL Overall ADL's : Needs assistance/impaired Eating/Feeding: Independent   Grooming: Independent;Sitting   Upper Body Bathing: Independent;Sitting   Lower Body Bathing: Modified independent;Sitting/lateral leans   Upper Body Dressing : Independent   Lower Body Dressing: Minimal assistance Lower Body Dressing Details (indicate cue type and reason): for proper placement of limb guard   Toilet Transfer Details (indicate cue type and reason): not assessed           General ADL Comments: pt with lateral scooting on flat bed with increased time and effort     Vision         Perception     Praxis      Pertinent Vitals/Pain Pain Assessment: 0-10 Pain Score: 7  Pain Location: LLE Pain Descriptors / Indicators: Aching;Throbbing Pain Intervention(s): Monitored during session     Hand Dominance Right   Extremity/Trunk Assessment Upper Extremity Assessment Upper Extremity Assessment: Overall WFL for tasks assessed   Lower Extremity Assessment Lower Extremity Assessment: Defer to PT evaluation RLE Deficits / Details: BKA, pt able to perform SLR against gravity but fatigues after 4-5 reps RLE: Unable to fully assess due to immobilization LLE Deficits / Details: chronic AKA, pt able to use functionally for scooting and complete full AROM  Cervical / Trunk Assessment Cervical / Trunk Assessment: Normal   Communication Communication Communication: No difficulties   Cognition Arousal/Alertness: Awake/alert Behavior During Therapy: WFL for tasks assessed/performed Overall Cognitive Status: Within Functional Limits for tasks assessed                                     General Comments  VSS, pt limb protector wet upon arrival of PT/OT, dried during session with no evidence of leaking from BKA site. Pt unsure if she spilled near her limb protector    Exercises     Shoulder Instructions       Home Living Family/patient expects to be discharged to:: Other (Comment) (motel room) Living Arrangements: Children                               Additional Comments: pt reports manual WC won't fit in bathroom at motel. has BSC and slide board she uses to get into car. transfers to [facility] with lateral scoot.      Prior Functioning/Environment Level of Independence: Independent with assistive device(s)        Comments: uses WC for mobility, slide board to get into car, but trasnfers to [facility] independently. uses BSC, reports daughter does all grocery shopping/driving, pt reports independence with medication managment        OT Problem List: Decreased knowledge of use of DME or AE;Pain      OT Treatment/Interventions: Self-care/ADL training;Therapeutic exercise;DME and/or AE instruction;Therapeutic activities;Patient/family education;Balance training    OT Goals(Current goals can be found in the care plan section) Acute Rehab OT Goals Patient Stated Goal: return home OT Goal Formulation: With patient Time For Goal Achievement: 04/12/21 Potential to Achieve Goals: Good ADL Goals Pt Will Transfer to Toilet: with modified independence;with transfer board Pt Will Perform Toileting - Clothing Manipulation and hygiene: with modified independence;sitting/lateral leans  OT Frequency: Min 2X/week   Barriers to D/C:            Co-evaluation PT/OT/SLP Co-Evaluation/Treatment: Yes Reason for Co-Treatment: Other (comment) (concerns for limited tolerance following HD)   OT goals addressed during session: ADL's and self-care      AM-PAC OT "6 Clicks" Daily Activity     Outcome Measure Help from another person eating meals?: None Help from another person taking care of personal grooming?: None Help from another person toileting, which includes using toliet, bedpan, or urinal?: A Lot Help from another person bathing (including washing, rinsing, drying)?: A Little Help from  another person to put on and taking off regular upper body clothing?: None Help from another person to put on and taking off regular lower body clothing?: None 6 Click Score: 21   End of Session Equipment Utilized During Treatment: Other (comment) (limb guard) Nurse Communication: Mobility status  Activity Tolerance: Patient tolerated treatment well Patient left: in bed;with call bell/phone within reach;with bed alarm set  OT Visit Diagnosis: Other abnormalities of gait and mobility (R26.89);Pain Pain - Right/Left: Right Pain - part of body: Leg                Time: [id number] OT Time Calculation (min): 21 min Charges:  OT General Charges $OT Visit: 1 Visit OT Evaluation $OT Eval Moderate Complexity: [place] OTR/L Acute Rehabilitation Services Office: [place] 03/29/2021, 2:34 PM

## 2021-03-29 NOTE — Progress Notes (Addendum)
I have personally seen and examined this patient and agree with the assessment/plan as outlined below.  HD today on schedule.    [name] 03/29/2021 11:34 AM     [facility] Progress Note   Subjective: Seen on HD attempting Net UFG 2 liters. C/O pain-just given pain meds.     Objective Vitals:   03/29/21 0821 03/29/21 0838 03/29/21 0845 03/29/21 0900  BP: 135/75 131/71 (!) 143/73 119/68  Pulse: 88 90 85 86  Resp: '16 15 16 18  '$ Temp: 98.2 F (36.8 C) 98.3 F (36.8 C)    TempSrc: Oral Oral    SpO2: 100% 100% 100% 100%  Weight:  77.5 kg     Physical Exam General: Chronically ill appearing female in NAD Heart: S1,S2 RRR Lungs: CTAB Abdomen: Obese active BS Extremities: L  AKA no stump edema. R BKA with stump protector in place.  Dialysis Access: L AVF blood lines connected.     Additional Objective Labs: Basic Metabolic Panel: Recent Labs  Lab 03/28/21 0546 03/29/21 0033  NA 137  136 128*  K 4.5  4.5 4.9  CL 97*  96* 90*  CO2 21*  23 21*  GLUCOSE 146*  150* 398*  BUN 54*  54* 71*  CREATININE 8.62*  8.72* 9.64*  CALCIUM 8.4*  8.6* 8.1*  PHOS 9.1*  --    Liver Function Tests: Recent Labs  Lab 03/28/21 0546  AST 13*  ALT 11  ALKPHOS 59  BILITOT 0.6  PROT 8.1  ALBUMIN 2.9*  2.9*   No results for input(s): LIPASE, AMYLASE in the last 168 hours. CBC: Recent Labs  Lab 03/28/21 0546 03/29/21 0033  WBC 9.4 11.5*  HGB 9.8* 7.6*  HCT 30.6* 24.1*  MCV 96.2 93.1  PLT 416* 391   Blood Culture    Component Value Date/Time   SDES BLOOD RIGHT ANTECUBITAL 01/28/2021 1812   SPECREQUEST  01/28/2021 1812    BOTTLES DRAWN AEROBIC AND ANAEROBIC Blood Culture adequate volume   CULT  01/28/2021 1812    NO GROWTH 5 DAYS Performed at [facility], [place] [address], [place],  [address]    REPTSTATUS 02/02/2021 FINAL 01/28/2021 1812    Cardiac Enzymes: No results for input(s): CKTOTAL, CKMB, CKMBINDEX, TROPONINI in  the last 168 hours. CBG: Recent Labs  Lab 03/28/21 0953 03/28/21 1145 03/28/21 1640 03/28/21 2112 03/29/21 0605  GLUCAP 236* 199* 312* 408* 274*   Iron Studies: No results for input(s): IRON, TIBC, TRANSFERRIN, FERRITIN in the last 72 hours. '@[email or web address]'$ @ Studies/Results: No results found. Medications:  sodium chloride     sodium chloride     sodium chloride     magnesium sulfate bolus IVPB      amLODipine  5 mg Oral Daily   aspirin EC  81 mg Oral Daily   atorvastatin  40 mg Oral Daily   bictegravir-emtricitabine-tenofovir AF  1 tablet Oral Daily   calcitRIOL       calcitRIOL  1.25 mcg Oral Daily   calcium acetate  2,001 mg Oral TID WC   Chlorhexidine Gluconate Cloth  6 each Topical Q0600   clopidogrel  75 mg Oral Daily   docusate sodium  100 mg Oral Daily   gabapentin  100 mg Oral Once per day on Mon Wed Fri   heparin  5,000 Units Subcutaneous Q8H   insulin aspart  0-6 Units Subcutaneous TID WC   insulin glargine-yfgn  8 Units Subcutaneous QHS   multivitamin  1 tablet  Oral Daily   pantoprazole  40 mg Oral Daily   polyethylene glycol  17 g Oral BID   sertraline  50 mg Oral QHS   sodium chloride flush  3 mL Intravenous Q12H     Dialysis Orders: [facility] T,Th,S 4 hrs 180NRe 400/Autoflow 1.5 69 kg 2.0 K/ 2.0 Ca L AVF -Heparin 3000 units IV TIW -Mircera 150 mcg IV q 2 weeks (last dose 03/20/21 Last HGB 9.7 03/20/21) -Venofer 50 mg IV weekly -Calcitriol 0.25 mcg PO TIW   Assessment/Plan:  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 Per VVS.   ESRD -  T,Th,S missed HD 03/27/21. No acute HD needs today. HD tomorrow on schedule. No heparin.   Hypertension/volume  - BP well controlled. R UF as tolerated. Resumed amlodipine 5 mg PO q HS.    Anemia  - HGB 9.8. Monitor post OP downward trend. ESA not due yet.   Metabolic bone disease -  Last OP Ca/PTH at goal. PO4 slightly elevated. Continue VDRA/binders.   Nutrition - NPO at present. Renal/Carb diet when able to eat.  Albumin 2.9.  DMT2-per priimary HIV-meds per primary  [name] 03/29/2021, 9:57 AM  [employer] [phone]

## 2021-03-29 NOTE — Progress Notes (Signed)
Internal Medicine Clinic Attending ° °Case discussed with Dr. Rehman  At the time of the visit.  We reviewed the resident’s history and exam and pertinent patient test results.  I agree with the assessment, diagnosis, and plan of care documented in the resident’s note.  ° °

## 2021-03-29 NOTE — Consult Note (Signed)
Indication for Consultation:  Management of ESRD/hemodialysis; anemia, hypertension/volume and secondary hyperparathyroidism PCP:   HPI: Kathy Frank is a 45 y.o. female with ESRD on hemodialysis T,Th,S at [facility]. Last HD 03/25/2021, left HD 1.4 kg above OP EDW. Some truncated treatments.  PMH:  DM, HTN. HIV, S/P L BKA, R BKA, DKA, gastroparesis, diabetic retinopathy,  erosive esophagitis, AOCD, SHPT. Former smoker.   She was admitted today per Dr. Trula Slade for non-healing amputation site. She is S/P revision of R BKA. Seen in PACU, appears comfortable, denies pain/SOB. BP controlled. Says she'd rather wait and have dialysis tomorrow on schedule.          Past Medical History:  Diagnosis Date   Anal abscess      chronic   Anxiety     CKD (chronic kidney disease) stage 5, GFR less than 15 ml/min (HCC) 06/19/2016    . Suspect etiologies to include diabetes and hypertension though cannot exclude HIV-associated nephropathy. Advised her against from using excessive Goody powder.   Constipation      trouble stoll coming out   Depression 06/28/2006    Qualifier: Diagnosis of  By: Riccardo Dubin MD, Todd     Diabetes mellitus type 2 in obese ([place]) 06/28/1994   DKA (diabetic ketoacidoses) 12/2014   Dyspnea      uses oxygeb 2 liters per minute at dialysis   End stage renal disease on dialysis [facility])     Erosive esophagitis     Esophageal reflux     ESRD on hemodialysis ([place]) 05/02/2019   Eye redness     Gastroparesis      ? diabetic   GERD (gastroesophageal reflux disease)     History of blood transfusion 2019   History of hypertension      off bp meds 2020 due to low bp with dialysis   Human immunodeficiency virus (HIV) disease ([place]) 04/23/2016   Hypertension     Metabolic bone disease [id number]   Moderate nonproliferative diabetic retinopathy of both eyes ([place]) 11/21/2014    11/14/14: Noted on retinal imaging; needs follow-up imaging in 6 months  05/22/16: Noted on  retinal imaging again; needs follow-up imaging in 6 months   Normocytic anemia     Pruritus, unspecified 05/20/2019   Renal failure (ARF), acute on chronic ([place]) 11/24/2018    tues, thurs sat [address] hs since sept 2020   Shortness of breath 07/01/2019    when lays doan at night, musy prop head up on 2 regular and 2 small pillows   Type 2 diabetes mellitus with diabetic peripheral angiopathy without gangrene ([place]) 05/01/2019   Type II diabetes mellitus ([place]) 1994    diagnosed around 1994   Wears dentures      lower   Wound dehiscence     Wound infection s/p L transmetatarsal amputation           Past Surgical History:  Procedure Laterality Date   ABDOMINAL AORTOGRAM W/LOWER EXTREMITY N/A 12/13/2020    Procedure: ABDOMINAL AORTOGRAM W/LOWER EXTREMITY;  Surgeon: Angelia Mould, MD;  Location: [place] [facility];  Service: Cardiovascular;  Laterality: N/A;   AMPUTATION Left 07/08/2018    Procedure: AMPUTATION FORTH RAY LEFT FOOT;  Surgeon: [name], MD;  Location: [place];  Service: Orthopedics;  Laterality: Left;   AMPUTATION Left 08/09/2018    Procedure: Left Transmetatarsal Amputation;  Surgeon: [name], MD;  Location: [place];  Service: Orthopedics;  Laterality: Left;   AMPUTATION Left  10/08/2018    Procedure: LEFT BELOW KNEE AMPUTATION;  Surgeon: [name], MD;  Location: [place];  Service: Orthopedics;  Laterality: Left;   AMPUTATION Left 10/28/2018    Procedure: REVISION BELOW KNEE AMPUTATION;  Surgeon: [name], MD;  Location: [place];  Service: Orthopedics;  Laterality: Left;   AMPUTATION Left 12/16/2018    Procedure: LEFT ABOVE KNEE AMPUTATION;  Surgeon: [name], MD;  Location: [place];  Service: Orthopedics;  Laterality: Left;   AMPUTATION Right 01/31/2021    Procedure: AMPUTATION BELOW KNEE;  Surgeon: Serafina Mitchell, MD;  Location: [employer];  Service: Vascular;  Laterality: Right;   AV FISTULA PLACEMENT Left 10/14/2018    Procedure: Arteriovenous (Av)  Fistula Creation Left Arm;  Surgeon: [name], MD;  Location: [place];  Service: Vascular;  Laterality: Left;   [place] Left 04/14/2019    Procedure: BASILIC VEIN TRANSPOSITION SECOND STAGE LEFT ARM;  Surgeon: Rosetta Posner, MD;  Location: [place];  Service: Vascular;  Laterality: Left;   INCISION AND DRAINAGE ABSCESS N/A 10/24/2020    Procedure: exicision of hydradenitis;  Surgeon: Leighton Ruff, MD;  Location: [place];  Service: General;  Laterality: N/A;  45 min   INSERTION OF DIALYSIS CATHETER Right 04/14/2019    Procedure: INSERTION OF DIALYSIS CATHETER;  Surgeon: Rosetta Posner, MD;  Location: [place];  Service: Vascular;  Laterality: Right;   LOWER EXTREMITY ANGIOGRAPHY N/A 07/05/2018    Procedure: LOWER EXTREMITY ANGIOGRAPHY;  Surgeon: Serafina Mitchell, MD;  Location: [place] [facility];  Service: Cardiovascular;  Laterality: N/A;   PERIPHERAL VASCULAR ATHERECTOMY Right 12/13/2020    Procedure: PERIPHERAL VASCULAR ATHERECTOMY;  Surgeon: Angelia Mould, MD;  Location: [place] [facility];  Service: Cardiovascular;  Laterality: Right;  Superficial femoral   PERIPHERAL VASCULAR BALLOON ANGIOPLASTY Left 07/05/2018    Procedure: PERIPHERAL VASCULAR BALLOON ANGIOPLASTY;  Surgeon: Serafina Mitchell, MD;  Location: [place] [facility];  Service: Cardiovascular;  Laterality: Left;  SFA   PERIPHERAL VASCULAR BALLOON ANGIOPLASTY Right 12/13/2020    Procedure: PERIPHERAL VASCULAR BALLOON ANGIOPLASTY;  Surgeon: Angelia Mould, MD;  Location: [place] [facility];  Service: Cardiovascular;  Laterality: Right;  Peroneal artery, anterior tibial artery   RECTAL EXAM UNDER ANESTHESIA N/A 10/24/2020    Procedure: EXAM UNDER ANESTHESIA;  Surgeon: Leighton Ruff, MD;  Location: [facility];  Service: General;  Laterality: N/A;   STUMP REVISION Left 10/19/2018    Procedure: REVISION LEFT BELOW KNEE AMPUTATION;  Surgeon: [name], MD;  Location: [place];  Service: Orthopedics;  Laterality: Left;   TUBAL LIGATION   2002         Family History  Problem Relation Age of Onset   Diabetes Mother     Diabetes Brother     Diabetes Daughter     Diabetes Daughter     Mental retardation Brother          died from PNA   Diabetes Maternal Grandmother      Social History:  reports that she quit smoking about 6 years ago. Her smoking use included cigarettes. She started smoking about 7 years ago. She has a 1.00 pack-year smoking history. She has never used smokeless tobacco. She reports that she does not currently use alcohol. She reports that she does not currently use drugs after having used the following drugs: Marijuana. No Known Allergies        Prior  to Admission medications   Medication Sig Start Date End Date Taking? Authorizing Provider  albuterol (VENTOLIN HFA) 108 (90 Base) MCG/ACT inhaler Inhale 2 puffs into the lungs every 4 (four) hours as needed for wheezing or shortness of breath. 02/14/21   Yes Angiulli, Lavon Paganini, PA-C  amLODipine (NORVASC) 5 MG tablet Take 1 tablet (5 mg total) by mouth daily. 02/14/21   Yes Angiulli, Lavon Paganini, PA-C  aspirin EC 81 MG tablet Take 1 tablet (81 mg total) by mouth daily. Swallow whole. 12/17/20   Yes Angelia Mould, MD  atorvastatin (LIPITOR) 40 MG tablet Take 1 tablet (40 mg total) by mouth daily. 02/14/21   Yes Angiulli, Lavon Paganini, PA-C  bictegravir-emtricitabine-tenofovir AF (BIKTARVY) 50-200-25 MG TABS tablet TAKE 1 TABLET BY MOUTH DAILY. 02/17/21 02/17/22 Yes Michel Bickers, MD  calcitRIOL (ROCALTROL) 0.25 MCG capsule Take 5 capsules (1.25 mcg total) by mouth daily. 02/14/21   Yes Angiulli, Lavon Paganini, PA-C  calcium acetate (PHOSLO) 667 MG capsule Take 3 capsules (2,001 mg total) by mouth 3 (three) times daily with meals. 02/14/21   Yes Angiulli, Lavon Paganini, PA-C  clopidogrel (PLAVIX) 75 MG tablet Take 1 tablet (75 mg total) by mouth daily. 02/14/21   Yes Angiulli, Lavon Paganini, PA-C  gabapentin (NEURONTIN) 100  MG capsule Take 1 capsule (100 mg total) by mouth 3 (three) times a week. Patient taking differently: Take 100-300 mg by mouth See admin instructions. Take 100 mg on Monday, Wednesday, Friday and Sunday 02/14/21   Yes Angiulli, Lavon Paganini, PA-C  insulin glargine (LANTUS SOLOSTAR) 100 UNIT/ML Solostar Pen Inject 8 Units into the skin at bedtime. Patient taking differently: Inject 10 Units into the skin at bedtime. 02/14/21   Yes Angiulli, Lavon Paganini, PA-C  multivitamin (RENA-VIT) TABS tablet Take 1 tablet by mouth daily. 02/14/21   Yes Angiulli, Lavon Paganini, PA-C  pantoprazole (PROTONIX) 40 MG tablet Take 1 tablet (40 mg total) by mouth daily. 02/14/21   Yes Angiulli, Lavon Paganini, PA-C  acetaminophen (TYLENOL) 325 MG tablet Take 2 tablets (650 mg total) by mouth every 6 (six) hours as needed for mild pain (or Fever >/= 101). Patient not taking: Reported on 03/24/2021 02/14/21     Angiulli, Lavon Paganini, PA-C  Insulin Pen Needle (PENTIPS) 32G X 4 MM MISC Use as directed 02/14/21     Meredith Staggers, MD  polyethylene glycol (MIRALAX / GLYCOLAX) 17 g packet Take 17 g by mouth 2 (two) times daily. Patient taking differently: Take 17 g by mouth daily as needed for mild constipation or moderate constipation. 02/14/21     Angiulli, Lavon Paganini, PA-C  sertraline (ZOLOFT) 50 MG tablet Take 1 tablet (50 mg total) by mouth at bedtime. 02/14/21     Angiulli, Lavon Paganini, PA-C             Current Facility-Administered Medications  Medication Dose Route Frequency Provider Last Rate Last Admin   0.9 %  sodium chloride infusion   Intravenous Continuous Effie Berkshire, MD   New Bag at 03/28/21 0700   0.9 %  sodium chloride infusion   Intravenous Continuous Serafina Mitchell, MD       acetaminophen (TYLENOL) tablet 1,000 mg  1,000 mg Oral Once Suzette Battiest, MD       Chlorhexidine Gluconate Cloth 2 % PADS 6 each  6 each Topical Once Serafina Mitchell, MD        And   Chlorhexidine Gluconate Cloth 2 % PADS 6 each  6  each Topical Once Serafina Mitchell, MD       fentaNYL (SUBLIMAZE) 100 MCG/2ML injection               fentaNYL (SUBLIMAZE) injection 25-50 mcg  25-50 mcg Intravenous Q5 min PRN Suzette Battiest, MD   25 mcg at 03/28/21 1036   insulin aspart (novoLOG) 100 UNIT/ML injection               promethazine (PHENERGAN) injection 6.25-12.5 mg  6.25-12.5 mg Intravenous Q15 min PRN Suzette Battiest, MD        Labs: Basic Metabolic Panel: Last Labs      Recent Labs  Lab 03/28/21 0546  NA 136  K 4.5  CL 96*  CO2 23  GLUCOSE 150*  BUN 54*  CREATININE 8.72*  CALCIUM 8.6*      Liver Function Tests: Last Labs      Recent Labs  Lab 03/28/21 0546  AST 13*  ALT 11  ALKPHOS 59  BILITOT 0.6  PROT 8.1  ALBUMIN 2.9*      Last Labs   No results for input(s): LIPASE, AMYLASE in the last 168 hours.   Last Labs   No results for input(s): AMMONIA in the last 168 hours.   CBC: Last Labs      Recent Labs  Lab 03/28/21 0546  WBC 9.4  HGB 9.8*  HCT 30.6*  MCV 96.2  PLT 416*      Cardiac Enzymes: Last Labs   No results for input(s): CKTOTAL, CKMB, CKMBINDEX, TROPONINI in the last 168 hours.   CBG: Last Labs       Recent Labs  Lab 03/28/21 0608 03/28/21 0953  GLUCAP 156* 236*      Iron Studies:  Recent Labs (last 2 labs)   No results for input(s): IRON, TIBC, TRANSFERRIN, FERRITIN in the last 72 hours.   Studies/Results: Imaging Results (Last 48 hours)  No results found.     ROS: As per HPI otherwise negative.     Physical Exam:       Vitals:    03/28/21 0939 03/28/21 0954 03/28/21 1009 03/28/21 1039  BP: 123/67 122/66 116/62 122/71  Pulse: 83 83 84 85  Resp: (!) '22 13 15 16  '$ Temp: 97.8 F (36.6 C)        TempSrc:          SpO2: 93% 99% 98% 99%     General: Chronically ill appearing female in NAD in no acute distress. Head: Normocephalic, atraumatic, sclera non-icteric, mucus membranes are moist Neck: Supple. JVD not elevated. Lungs: Clear bilaterally to auscultation without  wheezes, rales, or rhonchi. Breathing is unlabored. Heart: RRR with S1 S2. No murmurs, rubs, or gallops appreciated. Abdomen: Soft, non-tender, non-distended with normoactive bowel sounds. No rebound/guarding. No obvious abdominal masses.. Lower extremities: L BKA no edema. R BKA with stump protector in place.  Neuro: Alert and oriented X 3. Moves all extremities spontaneously. Psych:  Responds to questions appropriately with a normal affect. Dialysis Access: L AVF aneurysmal, + T/B. R EJ IV drsg intact   Dialysis Orders: [facility] T,Th,S 4 hrs 180NRe 400/Autoflow 1.5 69 kg 2.0 K/ 2.0 Ca L AVF -Heparin 3000 units IV TIW -Mircera 150 mcg IV q 2 weeks (last dose 03/20/21 Last HGB 9.7 03/20/21) -Venofer 50 mg IV weekly -Calcitriol 0.25 mcg PO TIW   Assessment/Plan:  R Nonhealing BKA Wound-Revision of R BKA-per VVS. Stable in PACU.   ESRD -  T,Th,S missed HD 03/27/21.  No acute HD needs today. HD tomorrow on schedule. No heparin.   Hypertension/volume  - BP well controlled. R UF as tolerated. Resume amlodipine 5 mg PO q HS when able.   Anemia  - HGB 9.8. Monitor post OP downward trend. ESA not due yet.   Metabolic bone disease -  Last OP Ca/PTH at goal. PO4 slightly elevated. Continue VDRA/binders.   Nutrition - NPO at present. Renal/Carb diet when able to eat. Albumin 2.9.  DMT2-per priimary HIV-meds per primary   Minard Millirons H. Owens Shark, NP-C 03/28/2021, 11:01 AM  [employer] [phone]

## 2021-03-30 LAB — GLUCOSE, CAPILLARY
Glucose-Capillary: 171 mg/dL — ABNORMAL HIGH (ref 70–99)
Glucose-Capillary: 140 mg/dL — ABNORMAL HIGH (ref 70–99)
Glucose-Capillary: 124 mg/dL — ABNORMAL HIGH (ref 70–99)
Glucose-Capillary: 91 mg/dL (ref 70–99)

## 2021-03-30 LAB — BASIC METABOLIC PANEL
Anion gap: 12 (ref 5–15)
BUN: 35 mg/dL — ABNORMAL HIGH (ref 6–20)
CO2: 24 mmol/L (ref 22–32)
Calcium: 9 mg/dL (ref 8.9–10.3)
Chloride: 95 mmol/L — ABNORMAL LOW (ref 98–111)
Creatinine, Ser: 5.8 mg/dL — ABNORMAL HIGH (ref 0.44–1.00)
GFR, Estimated: 9 mL/min — ABNORMAL LOW (ref >60)
Glucose, Bld: 172 mg/dL — ABNORMAL HIGH (ref 70–99)
Potassium: 4.8 mmol/L (ref 3.5–5.1)
Sodium: 131 mmol/L — ABNORMAL LOW (ref 135–145)

## 2021-03-30 LAB — CBC
HCT: 25.3 % — ABNORMAL LOW (ref 36.0–46.0)
Hemoglobin: 8 g/dL — ABNORMAL LOW (ref 12.0–15.0)
MCH: 30.1 pg (ref 26.0–34.0)
MCHC: 31.6 g/dL (ref 30.0–36.0)
MCV: 95.1 fL (ref 80.0–100.0)
Platelets: 348 10*3/uL (ref 150–400)
RBC: 2.66 MIL/uL — ABNORMAL LOW (ref 3.87–5.11)
RDW: 18.7 % — ABNORMAL HIGH (ref 11.5–15.5)
WBC: 10.5 10*3/uL (ref 4.0–10.5)
nRBC: 0 % (ref 0.0–0.2)

## 2021-03-30 LAB — HEPATITIS B SURFACE ANTIBODY, QUANTITATIVE: Hep B S AB Quant (Post): 116.4 m[IU]/mL (ref >9.9)

## 2021-03-30 MED ORDER — PROSOURCE PLUS PO LIQD
30.0000 mL | Freq: Two times a day (BID) | ORAL | Status: DC
  Administered 2021-03-30 – 2021-04-02 (×5): 30 mL via ORAL
  Filled 2021-03-30 (×5): qty 30

## 2021-03-30 MED ORDER — OXYCODONE HCL 5 MG PO TABS
5.0000 mg | ORAL_TABLET | ORAL | Status: DC | PRN
  Administered 2021-03-30 – 2021-03-31 (×5): 5 mg via ORAL
  Filled 2021-03-30 (×5): qty 1

## 2021-03-30 MED ORDER — DARBEPOETIN ALFA 150 MCG/0.3ML IJ SOSY
150.0000 ug | PREFILLED_SYRINGE | INTRAMUSCULAR | Status: DC
  Administered 2021-04-01: 150 ug via INTRAVENOUS
  Filled 2021-03-30: qty 0.3

## 2021-03-30 NOTE — Progress Notes (Addendum)
  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 says Vicodin is making her throw up. No other complaints   Vitals:   03/30/21 0130 03/30/21 0445  BP: (!) 168/77 (!) 173/83  Pulse: 91 93  Resp: 20 (!) 22  Temp: 98.9 F (37.2 C) 98.9 F (37.2 C)  SpO2: 100% 97%   Physical Exam: Cardiac:  regular Lungs:  non labored Incisions:  right BKA revision well appearing, staples intact. Flaps appear viable. No hematoma or fluid collections  Extremities:  well perfused and warm Neurologic: alert and oriented  CBC    Component Value Date/Time   WBC 10.5 03/30/2021 0129   RBC 2.66 (L) 03/30/2021 0129   HGB 8.0 (L) 03/30/2021 0129   HGB 9.8 (L) 07/18/2018 1133   HCT 25.3 (L) 03/30/2021 0129   HCT 19.3 (L) 10/09/2018 1405   PLT 348 03/30/2021 0129   PLT 417 07/18/2018 1133   MCV 95.1 03/30/2021 0129   MCV 89 07/18/2018 1133   MCH 30.1 03/30/2021 0129   MCHC 31.6 03/30/2021 0129   RDW 18.7 (H) 03/30/2021 0129   RDW 12.9 07/18/2018 1133   LYMPHSABS 1.3 02/11/2021 1151   LYMPHSABS 1.6 12/23/2017 1558   MONOABS 0.8 02/11/2021 1151   EOSABS 0.5 02/11/2021 1151   EOSABS 0.2 12/23/2017 1558   BASOSABS 0.1 02/11/2021 1151   BASOSABS 0.1 12/23/2017 1558    BMET    Component Value Date/Time   NA 131 (L) 03/30/2021 0129   NA 138 12/23/2017 1558   K 4.8 03/30/2021 0129   CL 95 (L) 03/30/2021 0129   CO2 24 03/30/2021 0129   GLUCOSE 172 (H) 03/30/2021 0129   BUN 35 (H) 03/30/2021 0129   BUN 28 (H) 12/23/2017 1558   CREATININE 5.80 (H) 03/30/2021 0129   CREATININE 12.07 (H) 05/29/2020 1155   CALCIUM 9.0 03/30/2021 0129   CALCIUM 8.5 (L) 11/25/2018 0309   GFRNONAA 9 (L) 03/30/2021 0129   GFRNONAA 79 11/14/2014 1154   GFRAA 5 (L) 01/06/2020 1329   GFRAA >89 11/14/2014 1154    INR    Component Value Date/Time   INR 1.0 03/28/2021 0546     Intake/Output Summary (Last 24 hours) at 03/30/2021 0914 Last data filed at 03/30/2021  0155 Gross per 24 hour  Intake 600 ml  Output 2000 ml  Net -1400 ml     Assessment/Plan:  45 y.o. female is s/p Revision of right BKA 2 Days Post-Op   Right BKA well appearing, staples intact. Viable flaps Dressings were changed. Continue with [place] HD per nephrology PT/ OT do not recommend any follow up  Anticipate d/c when pain better controlled Will have follow up arranged in 4 weeks for staple removal  DVT prophylaxis:  sq heparin   Karoline Caldwell, PA-C [facility] [phone] 03/30/2021 9:14 AM  I have interviewed the patient and examined the patient. I agree with the findings by the PA.  Her amputation site looks good Begin daily dressing changes.  Gae Gallop, MD

## 2021-03-30 NOTE — Progress Notes (Addendum)
I have personally seen and examined this patient and agree with the assessment/plan as outlined below.  ? If weights are accurate--> her hard leg splint make it challenging.  HD on schedule Tuesday Kathy Frank 03/30/2021 1:42 PM    [facility] Progress Note   Subjective: Seen in room, C/O that she is not getting enough pain medication. Apparently has been asking for IV pain meds. Minimal UF in HD yesterday. If weights are correct, still very much above OP EDW. Denies SOB, says she doesn't feel like she has excess fluid on board.   Objective Vitals:   03/29/21 2000 03/30/21 0130 03/30/21 0445 03/30/21 1114  BP: (!) 171/72 (!) 168/77 (!) 173/83 (!) 168/81  Pulse: 98 91 93 97  Resp: (!) 22 20 (!) 22 18  Temp: 99.9 F (37.7 C) 98.9 F (37.2 C) 98.9 F (37.2 C) 98.9 F (37.2 C)  TempSrc: Oral Oral Oral Oral  SpO2: 100% 100% 97% 100%  Weight:       Physical Exam General: Chronically ill appearing female in NAD Heart: S1,S2 RRR Lungs: CTAB Abdomen: Obese active BS Extremities: L  AKA no stump edema. R BKA with stump protector in place.  Dialysis Access: L AVF +T/B   Additional Objective Labs: Basic Metabolic Panel: Recent Labs  Lab 03/28/21 0546 03/29/21 0033 03/30/21 0129  NA 137  136 128* 131*  K 4.5  4.5 4.9 4.8  CL 97*  96* 90* 95*  CO2 21*  23 21* 24  GLUCOSE 146*  150* 398* 172*  BUN 54*  54* 71* 35*  CREATININE 8.62*  8.72* 9.64* 5.80*  CALCIUM 8.4*  8.6* 8.1* 9.0  PHOS 9.1*  --   --    Liver Function Tests: Recent Labs  Lab 03/28/21 0546  AST 13*  ALT 11  ALKPHOS 59  BILITOT 0.6  PROT 8.1  ALBUMIN 2.9*  2.9*   No results for input(s): LIPASE, AMYLASE in the last 168 hours. CBC: Recent Labs  Lab 03/28/21 0546 03/29/21 0033 03/30/21 0129  WBC 9.4 11.5* 10.5  HGB 9.8* 7.6* 8.0*  HCT 30.6* 24.1* 25.3*  MCV 96.2 93.1 95.1  PLT 416* 391 348   Blood Culture    Component Value Date/Time   SDES BLOOD RIGHT  ANTECUBITAL 01/28/2021 1812   SPECREQUEST  01/28/2021 1812    BOTTLES DRAWN AEROBIC AND ANAEROBIC Blood Culture adequate volume   CULT  01/28/2021 1812    NO GROWTH 5 DAYS Performed at [facility], [place] [address] [place], [place] [address]    REPTSTATUS 02/02/2021 FINAL 01/28/2021 1812    Cardiac Enzymes: No results for input(s): CKTOTAL, CKMB, CKMBINDEX, TROPONINI in the last 168 hours. CBG: Recent Labs  Lab 03/29/21 1146 03/29/21 1611 03/29/21 2039 03/30/21 0558 03/30/21 1112  GLUCAP 185* 202* 213* 171* 140*   Iron Studies: No results for input(s): IRON, TIBC, TRANSFERRIN, FERRITIN in the last 72 hours. '@lablastinr3'$ @ Studies/Results: No results found. Medications:  sodium chloride     sodium chloride     sodium chloride     magnesium sulfate bolus IVPB      amLODipine  5 mg Oral Daily   aspirin EC  81 mg Oral Daily   atorvastatin  40 mg Oral Daily   bictegravir-emtricitabine-tenofovir AF  1 tablet Oral Daily   calcitRIOL  1.25 mcg Oral Daily   calcium acetate  2,001 mg Oral TID WC   Chlorhexidine Gluconate Cloth  6 each Topical Q0600   clopidogrel  75 mg Oral Daily   docusate sodium  100 mg Oral Daily   gabapentin  100 mg Oral Once per day on Mon Wed Fri   heparin  5,000 Units Subcutaneous Q8H   insulin aspart  0-6 Units Subcutaneous TID WC   insulin glargine-yfgn  8 Units Subcutaneous QHS   multivitamin  1 tablet Oral Daily   pantoprazole  40 mg Oral Daily   polyethylene glycol  17 g Oral BID   sertraline  50 mg Oral QHS   sodium chloride flush  3 mL Intravenous Q12H     Dialysis Orders: [facility] T,Th,S 4 hrs 180NRe 400/Autoflow 1.5 69 kg 2.0 K/ 2.0 Ca L AVF -Heparin 3000 units IV TIW -Mircera 150 mcg IV q 2 weeks (last dose 03/20/21 Last HGB 9.7 03/20/21) -Venofer 50 mg IV weekly -Calcitriol 0.25 mcg PO TIW   Assessment/Plan:  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 Per [facility].   ESRD -  T,Th,S Next HD 04/01/2021 but asking for hoyer wt  today. If weights are correct, may need extra tx tomorrow.   Hypertension/volume  - BP elevated this AM but upset about pain medication and in pain. Resumed amlodipine 5 mg PO q HS. Net UF 1 liter with HD yesterday. Does not appear volume overloaded by exam. Needs hoyer wt for accuracy. Will order.   Anemia  - HGB down to 7.6 post OP from 9.8 on admission. HGB 8.0 today. Will order ESA with next HD.   Metabolic bone disease -  Last OP Ca/PTH at goal. PO4 slightly elevated. Continue VDRA/binders.   Nutrition - Renal/Carb diet when able to eat. Albumin 2.9. Add protein supps, renal vit.  DMT2-per priimary HIV-meds per primary  Kathy Frank 03/30/2021, 11:41 AM  [employer] [phone]

## 2021-03-30 NOTE — Progress Notes (Signed)
Mobility Specialist: Progress Note   03/30/21 1540  Mobility  Activity Turned to right side;Turned to left side;Turned to back (supine) (x4)  Level of Assistance Modified independent, requires aide device or extra time  Assistive Device  (Bedrail)  Mobility Sit up in bed/chair position for meals  Mobility Response Tolerated well  Mobility performed by Mobility specialist  $Mobility charge 1 Mobility   Acquired pt's weight per RN request. Pt able to roll to each side using assistance from bed rail. Pt back in bed with bed alarm on. Pt has call bell and phone at her side.   [facility] Wilbern Pennypacker Mobility Specialist Mobility Specialist Phone: [phone]

## 2021-03-31 ENCOUNTER — Encounter (HOSPITAL_COMMUNITY): Payer: Self-pay | Admitting: Surgery

## 2021-03-31 LAB — GLUCOSE, CAPILLARY
Glucose-Capillary: 104 mg/dL — ABNORMAL HIGH (ref 70–99)
Glucose-Capillary: 123 mg/dL — ABNORMAL HIGH (ref 70–99)
Glucose-Capillary: 169 mg/dL — ABNORMAL HIGH (ref 70–99)
Glucose-Capillary: 168 mg/dL — ABNORMAL HIGH (ref 70–99)

## 2021-03-31 MED ORDER — OXYCODONE HCL 5 MG PO TABS
5.0000 mg | ORAL_TABLET | ORAL | Status: DC | PRN
  Administered 2021-03-31 – 2021-04-01 (×4): 10 mg via ORAL
  Administered 2021-04-01: 5 mg via ORAL
  Administered 2021-04-01: 10 mg via ORAL
  Administered 2021-04-02: 5 mg via ORAL
  Administered 2021-04-02: 10 mg via ORAL
  Administered 2021-04-02: 5 mg via ORAL
  Filled 2021-03-31: qty 1
  Filled 2021-03-31: qty 2
  Filled 2021-03-31: qty 1
  Filled 2021-03-31 (×5): qty 2

## 2021-03-31 MED ORDER — AMOXICILLIN 250 MG PO CAPS
250.0000 mg | ORAL_CAPSULE | Freq: Two times a day (BID) | ORAL | Status: DC
  Administered 2021-03-31 – 2021-04-02 (×5): 250 mg via ORAL
  Filled 2021-03-31 (×5): qty 1

## 2021-03-31 MED ORDER — CALCITRIOL 0.25 MCG PO CAPS
0.2500 ug | ORAL_CAPSULE | ORAL | Status: DC
  Administered 2021-04-01: 0.25 ug via ORAL

## 2021-03-31 MED ORDER — AMOXICILLIN 250 MG PO CHEW
250.0000 mg | CHEWABLE_TABLET | Freq: Two times a day (BID) | ORAL | Status: DC
  Filled 2021-03-31: qty 1

## 2021-03-31 NOTE — Progress Notes (Signed)
Occupational Therapy Treatment Patient Details Name: Kathy Frank MRN: [id number] DOB: [birth year] Today's Date: 03/31/2021    History of present illness Kathy Frank is a 45 y.o. 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 Trula Slade, which has not healed. Pt is s/p redo right BKA 03/28/21.  PMH includes anxiety, ESRD on HD, depression, HTN, HIV, type 2 DM, L AKA.   OT comments  Pt has met established OT goals. Pt currently demonstrates ability to complete toilet transfer and LB dressing at modified independent level. Pt demosntrated and verbalized understanding of desensitization strategies and importance of limb guard on during mobility. Pt reports functioning at  prior level of functioning before hospital admission. No further acute OT needs identified. All education has been completed and the patient has no further questions. See below for any follow-up Occupational Therapy or equipment needs. OT to sign off. Thank you for referral.      Follow Up Recommendations  No OT follow up    Equipment Recommendations  Tub/shower bench    Recommendations for Other Services      Precautions / Restrictions Precautions Precautions: Fall Precaution Comments: Limb guard RLE Required Braces or Orthoses: Other Brace Other Brace: R limb protector Restrictions Weight Bearing Restrictions: Yes RLE Weight Bearing: Non weight bearing Other Position/Activity Restrictions: limb guard on during mobility       Mobility Bed Mobility Overal bed mobility: Independent             General bed mobility comments: pt able to complete supine to long sit and scoot in all directions in bed without assist.    Transfers Overall transfer level: Modified independent               General transfer comment: anterior posterior transfer at modified independent level    Balance Overall balance assessment: Modified Independent                                         ADL either  performed or assessed with clinical judgement   ADL Overall ADL's : Needs assistance/impaired Eating/Feeding: Independent   Grooming: Independent;Sitting   Upper Body Bathing: Independent;Sitting   Lower Body Bathing: Modified independent;Sitting/lateral leans   Upper Body Dressing : Independent   Lower Body Dressing: Modified independent;Sitting/lateral leans Lower Body Dressing Details (indicate cue type and reason): donned prosthetic long sitting in bed Toilet Transfer: Min guard Toilet Transfer Details (indicate cue type and reason): minguard to stabilize BSC due to hospital bedrail leaving gap between EOB and commode, when simulated with recliner pt able to complete at modified independent level, pt reports at home, no gap between her bed and BSC           General ADL Comments: pt with lateral scooting on flat bed with increased time and effort     Vision       Perception     Praxis      Cognition Arousal/Alertness: Awake/alert Behavior During Therapy: WFL for tasks assessed/performed Overall Cognitive Status: Within Functional Limits for tasks assessed                                          Exercises     Shoulder Instructions       General Comments VSS  Pertinent Vitals/ Pain       Pain Assessment: 0-10 Pain Score: 4  Pain Location: LLE phantom limb pain and sensation Pain Descriptors / Indicators: Aching;Throbbing Pain Intervention(s): Limited activity within patient's tolerance;Monitored during session  Home Living                                          Prior Functioning/Environment              Frequency  Min 2X/week        Progress Toward Goals  OT Goals(current goals can now be found in the care plan section)  Progress towards OT goals: Goals met/education completed, patient discharged from OT  Acute Rehab OT Goals Patient Stated Goal: return home OT Goal Formulation: With patient Time  For Goal Achievement: 04/12/21 Potential to Achieve Goals: Good ADL Goals Pt Will Transfer to Toilet: with modified independence;with transfer board Pt Will Perform Toileting - Clothing Manipulation and hygiene: with modified independence;sitting/lateral leans Additional ADL Goal #1: Pt will demonstrate independence with donning/doffing limb guard.  Plan Discharge plan remains appropriate;All goals met and education completed, patient discharged from OT services    Co-evaluation                 AM-PAC OT "6 Clicks" Daily Activity     Outcome Measure   Help from another person eating meals?: None Help from another person taking care of personal grooming?: None Help from another person toileting, which includes using toliet, bedpan, or urinal?: None Help from another person bathing (including washing, rinsing, drying)?: None Help from another person to put on and taking off regular upper body clothing?: None Help from another person to put on and taking off regular lower body clothing?: None 6 Click Score: 24    End of Session Equipment Utilized During Treatment: Other (comment) (limb guard)  OT Visit Diagnosis: Other abnormalities of gait and mobility (R26.89);Pain Pain - Right/Left: Right Pain - part of body: Leg   Activity Tolerance Patient tolerated treatment well   Patient Left in bed;with call bell/phone within reach;with bed alarm set   Nurse Communication Mobility status        Time: 1220-1240 OT Time Calculation (min): 20 min  Charges: OT General Charges $OT Visit: 1 Visit OT Treatments $Self Care/Home Management : 8-22 mins  Helene Kelp OTR/L Acute Rehabilitation Services Office: [phone]    Wyn Forster 03/31/2021, 1:40 PM

## 2021-03-31 NOTE — Progress Notes (Signed)
Mobility Specialist: Progress Note   03/31/21 1718  Mobility  Activity Transferred:  Bed to chair  Level of Assistance Standby assist, set-up cues, supervision of patient - no hands on  Assistive Device None  Mobility Out of bed to chair with meals  Mobility Response Tolerated well  Mobility performed by Mobility specialist  Bed Position Chair  $Mobility charge 1 Mobility   Pt required chair set-up and standby assist to transfer. Pt performed posterior scoot into the recliner. Pt has call bell at her side and chair alarm is on.   [facility] Zaylee Cornia Mobility Specialist Mobility Specialist Phone: [phone]

## 2021-03-31 NOTE — Progress Notes (Addendum)
[facility] [place]  Subjective  - Pain issues   Objective (!) 163/77 85 98.6 F (37 C) (Oral) 16 100%  Intake/Output Summary (Last 24 hours) at 03/31/2021 0750 Last data filed at 03/30/2021 2219 Gross per 24 hour  Intake 3 ml  Output --  Net 3 ml    Dressing changed, right BKA appears viable  Lungs non labored breathing Heart RRR    Assessment/Planning: 45 y.o. female is s/p Revision of right BKA 3 Days Post-Op   Pending pain control possible discharge tomorrow after HD Will D/C hydrocodone and keep percocet. Patient states Hydrocodone makes her N/V  Roxy Horseman 03/31/2021 7:50 AM --  Laboratory Lab Results: Recent Labs    03/29/21 0033 03/30/21 0129  WBC 11.5* 10.5  HGB 7.6* 8.0*  HCT 24.1* 25.3*  PLT 391 348   BMET Recent Labs    03/29/21 0033 03/30/21 0129  NA 128* 131*  K 4.9 4.8  CL 90* 95*  CO2 21* 24  GLUCOSE 398* 172*  BUN 71* 35*  CREATININE 9.64* 5.80*  CALCIUM 8.1* 9.0    COAG Lab Results  Component Value Date   INR 1.0 03/28/2021   INR 1.3 (H) 01/28/2021   INR 1.1 05/03/2019   No results found for: PTT  I agree with the above.  The patient stump is healing.  We will continue with pain control and hopefully discharge in the near future  Wells Malaney Mcbean

## 2021-03-31 NOTE — Progress Notes (Signed)
PT Cancellation Note  Patient Details Name: Kathy Frank MRN: [id number] DOB: 1976/04/11   Cancelled Treatment:    Reason Eval/Treat Not Completed: Fatigue/lethargy limiting ability to participate. Patient declined this morning reporting she had just received morphine. When returned later this morning she is sleeping soundly. Will re-attempt later as time allows.    [name] 03/31/2021, 12:25 PM

## 2021-03-31 NOTE — Progress Notes (Signed)
Patient had several bouts of emesis after receiving morning medications. Zofran given. Patient denies having nausea from medication previously. Will continue to monitor patient closely.

## 2021-03-31 NOTE — Progress Notes (Signed)
[facility] Progress Note  Subjective: pt seen in room, in good spirits  Vitals:   03/31/21 0400 03/31/21 0453 03/31/21 0809 03/31/21 1243  BP: (!) 191/86 (!) 163/77 (!) 148/72 (!) 157/73  Pulse: 85  88 84  Resp: '16  16 16  '$ Temp: 98.6 F (37 C)  98.6 F (37 C) 97.9 F (36.6 C)  TempSrc: Oral  Oral Oral  SpO2: 100%  100% 100%  Weight:        Exam: General: AAF in NAD, pleasant Heart: S1,S2 RRR Lungs: CTAB Abdomen: Obese active BS Extremities: L  AKA no stump edema. R BKA with stump protector in place.  Dialysis Access: L AVF +T/B    OP HD: [facility] TTS  4h  400/1.5  69kg   2/2 bath  LUE AVF  Heparin 3000  Mircera 150 q 2, last 8/11  Venofer 50 q wk  Calcitriol 0.25 tiw po  Assessment/ Plan: 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  HD tomorrow.  BP/volume - BP's remain up a bit, no gross edema on exam. UF 2-3 L goal w/ next HD HIV - per pmd DM2 - per pmd  Anemia ckd - Hb stabilizing around 8 post op. Darbe 150 ug ordered for tomorrow 8/23.  MBD ckd - cont phoslo, vdra (calcitriol 0.25 tiw po w/ hd)     Rob Caroly Purewal 03/31/2021, 2:16 PM   Recent Labs  Lab 03/28/21 0546 03/29/21 0033 03/30/21 0129  K 4.5  4.5 4.9 4.8  BUN 54*  54* 71* 35*  CREATININE 8.62*  8.72* 9.64* 5.80*  CALCIUM 8.4*  8.6* 8.1* 9.0  PHOS 9.1*  --   --   HGB 9.8* 7.6* 8.0*   Inpatient medications:  (feeding supplement) PROSource Plus  30 mL Oral BID BM   amLODipine  5 mg Oral Daily   amoxicillin  250 mg Oral BID   aspirin EC  81 mg Oral Daily   atorvastatin  40 mg Oral Daily   bictegravir-emtricitabine-tenofovir AF  1 tablet Oral Daily   calcitRIOL  1.25 mcg Oral Daily   calcium acetate  2,001 mg Oral TID WC   Chlorhexidine Gluconate Cloth  6 each Topical Q0600   clopidogrel  75 mg Oral Daily   [START ON 04/01/2021] darbepoetin (ARANESP) injection - DIALYSIS  150 mcg Intravenous Q Tue-HD   docusate sodium  100 mg Oral Daily   gabapentin  100 mg Oral  Once per day on Mon Wed Fri   heparin  5,000 Units Subcutaneous Q8H   insulin aspart  0-6 Units Subcutaneous TID WC   insulin glargine-yfgn  8 Units Subcutaneous QHS   multivitamin  1 tablet Oral Daily   pantoprazole  40 mg Oral Daily   polyethylene glycol  17 g Oral BID   sertraline  50 mg Oral QHS   sodium chloride flush  3 mL Intravenous Q12H    sodium chloride     sodium chloride     sodium chloride     magnesium sulfate bolus IVPB     sodium chloride, sodium chloride, sodium chloride, acetaminophen, albuterol, alum & mag hydroxide-simeth, bisacodyl, guaiFENesin-dextromethorphan, hydrALAZINE, labetalol, lidocaine (PF), lidocaine-prilocaine, magnesium sulfate bolus IVPB, metoprolol tartrate, morphine injection, ondansetron, oxyCODONE, pentafluoroprop-tetrafluoroeth, phenol, potassium chloride, senna-docusate, sodium chloride flush

## 2021-03-31 NOTE — Anesthesia Postprocedure Evaluation (Signed)
Anesthesia Post Note  Patient: Kathy Frank  Procedure(s) Performed: REVISION OF RIGHT BELOW KNEE AMPUTATION (Right: Knee)     Patient location during evaluation: PACU Anesthesia Type: General Level of consciousness: awake and alert Pain management: pain level controlled Vital Signs Assessment: post-procedure vital signs reviewed and stable Respiratory status: spontaneous breathing, nonlabored ventilation, respiratory function stable and patient connected to nasal cannula oxygen Cardiovascular status: blood pressure returned to baseline and stable Postop Assessment: no apparent nausea or vomiting Anesthetic complications: no   No notable events documented.  Last Vitals:  Vitals:   03/31/21 1243 03/31/21 1455  BP: (!) 157/73 (!) 157/73  Pulse: 84 87  Resp: 16 16  Temp: 36.6 C 37.4 C  SpO2: 100% 97%    Last Pain:  Vitals:   03/31/21 1827  TempSrc:   PainSc: 6                  Tiajuana Amass

## 2021-03-31 NOTE — Progress Notes (Signed)
Mobility Specialist: Progress Note   03/31/21 1810  Mobility  Activity  (Wheelchair mobility)  Level of Assistance Standby assist, set-up cues, supervision of patient - no hands on  Assistive Device Wheelchair  Distance Ambulated (ft) 470 ft  Mobility Response Tolerated well  Mobility performed by Mobility specialist  $Mobility charge 1 Mobility   Pt standby assist to transfer to/from the chair and wheelchair. Pt asx during session. Pt back to bed after wheelchair mobility with call bell and phone at her side.   [facility] Kathy Frank Mobility Specialist Mobility Specialist Phone: [phone]

## 2021-04-01 ENCOUNTER — Other Ambulatory Visit (HOSPITAL_COMMUNITY): Payer: Self-pay

## 2021-04-01 LAB — RENAL FUNCTION PANEL
Albumin: 2.5 g/dL — ABNORMAL LOW (ref 3.5–5.0)
Anion gap: 13 (ref 5–15)
BUN: 68 mg/dL — ABNORMAL HIGH (ref 6–20)
CO2: 23 mmol/L (ref 22–32)
Calcium: 9.5 mg/dL (ref 8.9–10.3)
Chloride: 91 mmol/L — ABNORMAL LOW (ref 98–111)
Creatinine, Ser: 10.01 mg/dL — ABNORMAL HIGH (ref 0.44–1.00)
GFR, Estimated: 4 mL/min — ABNORMAL LOW (ref >60)
Glucose, Bld: 124 mg/dL — ABNORMAL HIGH (ref 70–99)
Phosphorus: 7.5 mg/dL — ABNORMAL HIGH (ref 2.5–4.6)
Potassium: 6.4 mmol/L (ref 3.5–5.1)
Sodium: 127 mmol/L — ABNORMAL LOW (ref 135–145)

## 2021-04-01 LAB — CBC
HCT: 18.3 % — ABNORMAL LOW (ref 36.0–46.0)
HCT: 18.4 % — ABNORMAL LOW (ref 36.0–46.0)
Hemoglobin: 5.6 g/dL — CL (ref 12.0–15.0)
Hemoglobin: 5.7 g/dL — CL (ref 12.0–15.0)
MCH: 29.2 pg (ref 26.0–34.0)
MCH: 29.4 pg (ref 26.0–34.0)
MCHC: 30.6 g/dL (ref 30.0–36.0)
MCHC: 31 g/dL (ref 30.0–36.0)
MCV: 95.3 fL (ref 80.0–100.0)
MCV: 94.8 fL (ref 80.0–100.0)
Platelets: 278 10*3/uL (ref 150–400)
Platelets: 295 10*3/uL (ref 150–400)
RBC: 1.92 MIL/uL — ABNORMAL LOW (ref 3.87–5.11)
RBC: 1.94 MIL/uL — ABNORMAL LOW (ref 3.87–5.11)
RDW: 17.5 % — ABNORMAL HIGH (ref 11.5–15.5)
RDW: 17.5 % — ABNORMAL HIGH (ref 11.5–15.5)
WBC: 10.7 10*3/uL — ABNORMAL HIGH (ref 4.0–10.5)
WBC: 9.6 10*3/uL (ref 4.0–10.5)
nRBC: 0 % (ref 0.0–0.2)
nRBC: 0 % (ref 0.0–0.2)

## 2021-04-01 LAB — GLUCOSE, CAPILLARY
Glucose-Capillary: 140 mg/dL — ABNORMAL HIGH (ref 70–99)
Glucose-Capillary: 127 mg/dL — ABNORMAL HIGH (ref 70–99)
Glucose-Capillary: 184 mg/dL — ABNORMAL HIGH (ref 70–99)
Glucose-Capillary: 164 mg/dL — ABNORMAL HIGH (ref 70–99)

## 2021-04-01 LAB — PREPARE RBC (CROSSMATCH)

## 2021-04-01 MED ORDER — OXYCODONE HCL 5 MG PO TABS
ORAL_TABLET | ORAL | Status: AC
  Filled 2021-04-01: qty 2

## 2021-04-01 MED ORDER — SODIUM CHLORIDE 0.9% IV SOLUTION: Freq: Once | INTRAVENOUS | Status: AC

## 2021-04-01 MED ORDER — CALCITRIOL 0.25 MCG PO CAPS: 0.2500 ug | ORAL_CAPSULE | ORAL | 0 refills | Status: DC

## 2021-04-01 MED ORDER — AMOXICILLIN 250 MG PO CAPS: 250.0000 mg | ORAL_CAPSULE | Freq: Two times a day (BID) | ORAL | 0 refills | Status: DC

## 2021-04-01 MED ORDER — DARBEPOETIN ALFA 150 MCG/0.3ML IJ SOSY
PREFILLED_SYRINGE | INTRAMUSCULAR | Status: AC
  Filled 2021-04-01: qty 0.3

## 2021-04-01 MED ORDER — CALCITRIOL 0.25 MCG PO CAPS
ORAL_CAPSULE | ORAL | Status: AC
  Filled 2021-04-01: qty 1

## 2021-04-01 MED ORDER — OXYCODONE HCL 5 MG PO TABS: 5.0000 mg | ORAL_TABLET | Freq: Four times a day (QID) | ORAL | 0 refills | Status: DC | PRN

## 2021-04-01 NOTE — Care Management Important Message (Signed)
Important Message  Patient Details  Name: OLUWASEUN TANDE MRN: [id number] Date of Birth: [birth year]   Medicare Important Message Given:  Yes     Jeannett Dekoning 04/01/2021, 3:52 PM

## 2021-04-01 NOTE — Progress Notes (Addendum)
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  HD tomorrow.  BP/volume - BP's remain up a bit, no gross edema on exam. UF 2-3 L goal w/ next HD HIV - per pmd DM2 - per pmd  Anemia ckd - ???  Rechecked hemoglobin 5.7 she appears asymptomatic rechecking  agrees for blood transfusion with Hgb below 7  Darbe 150 ug ordered for today 8/23.  MBD ckd - cont phoslo, vdra (calcitriol 0.25 tiw po w/ hd) corrected calcium over 10 we will hold calcitriol   Ernest Haber, PA-C [facility] Beeper [phone] 04/01/2021,2:21 PM  LOS: 4 days   Labs: Basic Metabolic Panel: Recent Labs  Lab 03/28/21 0546 03/29/21 0033 03/30/21 0129 04/01/21 1125  NA 137  136 128* 131* 127*  K 4.5  4.5 4.9 4.8 6.4*  CL 97*  96* 90* 95* 91*  CO2 21*  23 21* 24 23  GLUCOSE 146*  150* 398* 172* 124*  BUN 54*  54* 71* 35* 68*  CREATININE 8.62*  8.72* 9.64* 5.80* 10.01*  CALCIUM 8.4*  8.6* 8.1* 9.0 9.5  PHOS 9.1*  --   --  7.5*   Liver Function Tests: Recent Labs  Lab 03/28/21 0546 04/01/21 1125  AST 13*  --   ALT 11  --   ALKPHOS 59  --   BILITOT 0.6  --   PROT 8.1  --   ALBUMIN 2.9*  2.9* 2.5*   No results  for input(s): LIPASE, AMYLASE in the last 168 hours. No results for input(s): AMMONIA in the last 168 hours. CBC: Recent Labs  Lab 03/28/21 0546 03/29/21 0033 03/30/21 0129 04/01/21 1125 04/01/21 1300  WBC 9.4 11.5* 10.5 10.7* 9.6  HGB 9.8* 7.6* 8.0* 5.6* 5.7*  HCT 30.6* 24.1* 25.3* 18.3* 18.4*  MCV 96.2 93.1 95.1 95.3 94.8  PLT 416* 391 348 278 295   Cardiac Enzymes: No results for input(s): CKTOTAL, CKMB, CKMBINDEX, TROPONINI in the last 168 hours. CBG: Recent Labs  Lab 03/31/21 1242 03/31/21 1711 03/31/21 2121 04/01/21 0620 04/01/21 1127  GLUCAP 123* 169* 168* 140* 127*    Studies/Results: No results found. Medications:  sodium chloride     sodium chloride     sodium chloride     magnesium sulfate bolus IVPB      (feeding supplement) PROSource Plus  30 mL Oral BID BM   sodium chloride   Intravenous Once   amLODipine  5 mg Oral Daily   amoxicillin  250 mg Oral BID  aspirin EC  81 mg Oral Daily   atorvastatin  40 mg Oral Daily   bictegravir-emtricitabine-tenofovir AF  1 tablet Oral Daily   calcitRIOL  0.25 mcg Oral Q T,Th,Sa-HD   calcium acetate  2,001 mg Oral TID WC   Chlorhexidine Gluconate Cloth  6 each Topical Q0600   clopidogrel  75 mg Oral Daily   darbepoetin (ARANESP) injection - DIALYSIS  150 mcg Intravenous Q Tue-HD   docusate sodium  100 mg Oral Daily   gabapentin  100 mg Oral Once per day on Mon Wed Fri   heparin  5,000 Units Subcutaneous Q8H   insulin aspart  0-6 Units Subcutaneous TID WC   insulin glargine-yfgn  8 Units Subcutaneous QHS   multivitamin  1 tablet Oral Daily   oxyCODONE       pantoprazole  40 mg Oral Daily   polyethylene glycol  17 g Oral BID   sertraline  50 mg Oral QHS   sodium chloride flush  3 mL Intravenous Q12H

## 2021-04-01 NOTE — Progress Notes (Signed)
Hemodialysis- Critical hgb result called by lab, 5.8. Called to Dr. Jonnie Finner. Order to redraw. Done

## 2021-04-01 NOTE — Progress Notes (Signed)
Hemodialysis- Completed without issue. 3L removed. Blood bank in process of preparing prbcs. Updated primary RN Katie.

## 2021-04-01 NOTE — Progress Notes (Addendum)
[facility] [place]  Subjective  - Pain better controlled   Objective (!) 154/71 88 99.2 F (37.3 C) (Oral) 20 100%  Intake/Output Summary (Last 24 hours) at 04/01/2021 [id number] Last data filed at 03/31/2021 1943 Gross per 24 hour  Intake 373 ml  Output --  Net 373 ml    Right BKA incision intact without ischemic skin changes, SS bloody drainage on old dressing Clean dry dressing applied Lungs non labored breathing  Assessment/Planning: 45 y.o. female is s/p Revision of right BKA 4 Days Post-Op   Pain controlled with percocet Plan for discharge today after HD F/U in [facility] clinic in 2 weeks for incisional check  Roxy Horseman 04/01/2021 7:22 AM --  Laboratory Lab Results: Recent Labs    03/30/21 0129  WBC 10.5  HGB 8.0*  HCT 25.3*  PLT 348   BMET Recent Labs    03/30/21 0129  NA 131*  K 4.8  CL 95*  CO2 24  GLUCOSE 172*  BUN 35*  CREATININE 5.80*  CALCIUM 9.0    COAG Lab Results  Component Value Date   INR 1.0 03/28/2021   INR 1.3 (H) 01/28/2021   INR 1.1 05/03/2019   No results found for: PTT  Stable for d/c  WElls Carys Malina

## 2021-04-01 NOTE — Progress Notes (Signed)
Hemodialysis- Hgb repeated and resulted 5.7. Called to Dr. Jonnie Finner. Patient has 1 hour remaining on HD.

## 2021-04-01 NOTE — Progress Notes (Signed)
Physical Therapy Treatment Patient Details Name: Kathy Frank MRN: [id number] DOB: [birth year] Today's Date: 04/01/2021    History of Present Illness Kathy Frank is a 45 y.o. 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 Trula Slade, which has not healed. Pt is s/p redo right BKA 03/28/21.  PMH includes anxiety, ESRD on HD, depression, HTN, HIV, type 2 DM, L AKA.    PT Comments    Pt longsitting in bed on entry, request assist to get to recliner to wash up. Then requests to get to [facility], requiring mod I for anterior posterior transfer. Pt noted to be uncomfortable due to constipation. Sat on BSC but was unproductive. Transferred back to bed with mod I where she was able to perform pericare on her back. Pt then transferred to recliner to with mod I for anterior posterior. Pt left with set up to continue bathing and change into her clothing as she is discharging from HD. D/c plans remain appropriate.     Follow Up Recommendations  No PT follow up;Supervision for mobility/OOB     Equipment Recommendations  None recommended by PT       Precautions / Restrictions Precautions Precautions: Fall Precaution Comments: Limb guard RLE Required Braces or Orthoses: Other Brace Other Brace: R limb protector Restrictions Weight Bearing Restrictions: Yes RLE Weight Bearing: Non weight bearing LLE Weight Bearing: Non weight bearing Other Position/Activity Restrictions: limb guard on during mobility    Mobility  Bed Mobility Overal bed mobility: Independent             General bed mobility comments: pt able to complete supine to long sit and scoot in all directions in bed without assist.    Transfers Overall transfer level: Modified independent               General transfer comment: anterior posterior transfer at modified independent level, needs external stabilization of BSC due to gap between bed and BSC, pt states her BSC is flush with her bed at home  Ambulation/Gait              General Gait Details: non-ambulatory at baseline          Balance Overall balance assessment: Modified Independent                                          Cognition Arousal/Alertness: Awake/alert Behavior During Therapy: WFL for tasks assessed/performed Overall Cognitive Status: Within Functional Limits for tasks assessed                                        Exercises General Exercises - Upper Extremity Shoulder Horizontal ABduction: AROM;Both;5 reps Other Exercises Other Exercises: PNF D1 x5 Other Exercises: PNF D2 x5    General Comments General comments (skin integrity, edema, etc.): VSS      Pertinent Vitals/Pain Pain Assessment: Faces Faces Pain Scale: Hurts a little bit Pain Location: constipation Pain Descriptors / Indicators: Aching;Throbbing;Discomfort;Moaning;Pressure;Tightness Pain Intervention(s): Limited activity within patient's tolerance;Monitored during session;Repositioned     PT Goals (current goals can now be found in the care plan section) Acute Rehab PT Goals Patient Stated Goal: return home PT Goal Formulation: With patient Time For Goal Achievement: 04/12/21 Potential to Achieve Goals: Good Progress towards PT goals: Progressing toward goals  Frequency    Min 3X/week      PT Plan Current plan remains appropriate       AM-PAC PT "6 Clicks" Mobility   Outcome Measure  Help needed turning from your back to your side while in a flat bed without using bedrails?: None Help needed moving from lying on your back to sitting on the side of a flat bed without using bedrails?: None Help needed moving to and from a bed to a chair (including a wheelchair)?: None Help needed standing up from a chair using your arms (e.g., wheelchair or bedside chair)?: Total Help needed to walk in hospital room?: Total Help needed climbing 3-5 steps with a railing? : Total 6 Click Score: 15    End of Session  Equipment Utilized During Treatment: Other (comment) (lim b protector) Activity Tolerance: Patient tolerated treatment well;Patient limited by fatigue Patient left: in chair;with call bell/phone within reach;with chair alarm set Nurse Communication: Mobility status PT Visit Diagnosis: Other abnormalities of gait and mobility (R26.89)     Time: [id number] PT Time Calculation (min) (ACUTE ONLY): 46 min  Charges:  $Therapeutic Activity: 8-22 mins $Self Care/Home Management: 8-22                     Jeno Calleros B. Migdalia Dk PT, DPT Acute Rehabilitation Services Pager [phone] Office [phone]    [place] 04/01/2021, 10:24 AM

## 2021-04-01 NOTE — Progress Notes (Addendum)
Hemodialysis- New type and screen drawn per blood bank. Dr. Trula Slade notified as well.

## 2021-04-02 ENCOUNTER — Other Ambulatory Visit: Payer: Self-pay | Admitting: Physician Assistant

## 2021-04-02 DIAGNOSIS — N186 End stage renal disease: Secondary | ICD-10-CM | POA: Diagnosis not present

## 2021-04-02 DIAGNOSIS — I739 Peripheral vascular disease, unspecified: Secondary | ICD-10-CM

## 2021-04-02 DIAGNOSIS — Z992 Dependence on renal dialysis: Secondary | ICD-10-CM | POA: Diagnosis not present

## 2021-04-02 DIAGNOSIS — E1129 Type 2 diabetes mellitus with other diabetic kidney complication: Secondary | ICD-10-CM | POA: Diagnosis not present

## 2021-04-02 LAB — TYPE AND SCREEN
ABO/RH(D): B POS
Antibody Screen: NEGATIVE
Unit division: 0
Unit division: 0

## 2021-04-02 LAB — BPAM RBC
Blood Product Expiration Date: 202209142359
Blood Product Expiration Date: 202209152359
ISSUE DATE / TIME: 202208231900
ISSUE DATE / TIME: 202208232241
Unit Type and Rh: 7300
Unit Type and Rh: 7300

## 2021-04-02 LAB — CBC
HCT: 23.9 % — ABNORMAL LOW (ref 36.0–46.0)
Hemoglobin: 8 g/dL — ABNORMAL LOW (ref 12.0–15.0)
MCH: 31.4 pg (ref 26.0–34.0)
MCHC: 33.5 g/dL (ref 30.0–36.0)
MCV: 93.7 fL (ref 80.0–100.0)
Platelets: 253 10*3/uL (ref 150–400)
RBC: 2.55 MIL/uL — ABNORMAL LOW (ref 3.87–5.11)
RDW: 16.2 % — ABNORMAL HIGH (ref 11.5–15.5)
WBC: 8.5 10*3/uL (ref 4.0–10.5)
nRBC: 0 % (ref 0.0–0.2)

## 2021-04-02 LAB — GLUCOSE, CAPILLARY: Glucose-Capillary: 90 mg/dL (ref 70–99)

## 2021-04-02 MED ORDER — OXYCODONE-ACETAMINOPHEN 5-325 MG PO TABS
1.0000 | ORAL_TABLET | Freq: Four times a day (QID) | ORAL | 0 refills | Status: DC | PRN
Start: 2021-04-02 — End: 2021-04-22

## 2021-04-02 NOTE — Plan of Care (Signed)
  Problem: Education: Goal: Knowledge of General Education information will improve Description: Including pain rating scale, medication(s)/side effects and non-pharmacologic comfort measures Outcome: Adequate for Discharge   

## 2021-04-02 NOTE — Progress Notes (Signed)
Discharge instructions (including medications) discussed with and copy provided to patient/caregiver 

## 2021-04-02 NOTE — Progress Notes (Signed)
[facility] [place]  Subjective  - Feels better since transfusion   Objective (!) 156/77 65 99.6 F (37.6 C) (Oral) 20 98%  Intake/Output Summary (Last 24 hours) at 04/02/2021 0725 Last data filed at 04/02/2021 0500 Gross per 24 hour  Intake 1662.67 ml  Output 3000 ml  Net -1337.33 ml    Right BKA appears viable Dressing in place with stump shrinker on Lungs non labored breathing  Assessment/Planning: 45 y.o. female is s/p Revision of right BKA 5 Days Post-Op   Discharge in stable condition Chronic anemia post transfusion HGB 8.0 up from 5.7 F/U in [facility] clinic in 2 weeks for incisional check  Roxy Horseman 04/02/2021 7:25 AM --  Laboratory Lab Results: Recent Labs    04/01/21 1300 04/02/21 0356  WBC 9.6 8.5  HGB 5.7* 8.0*  HCT 18.4* 23.9*  PLT 295 253   BMET Recent Labs    04/01/21 1125  NA 127*  K 6.4*  CL 91*  CO2 23  GLUCOSE 124*  BUN 68*  CREATININE 10.01*  CALCIUM 9.5    COAG Lab Results  Component Value Date   INR 1.0 03/28/2021   INR 1.3 (H) 01/28/2021   INR 1.1 05/03/2019   No results found for: PTT

## 2021-04-04 DIAGNOSIS — D689 Coagulation defect, unspecified: Secondary | ICD-10-CM | POA: Diagnosis not present

## 2021-04-04 DIAGNOSIS — R519 Headache, unspecified: Secondary | ICD-10-CM | POA: Diagnosis not present

## 2021-04-04 DIAGNOSIS — D509 Iron deficiency anemia, unspecified: Secondary | ICD-10-CM | POA: Diagnosis not present

## 2021-04-04 DIAGNOSIS — N2581 Secondary hyperparathyroidism of renal origin: Secondary | ICD-10-CM | POA: Diagnosis not present

## 2021-04-04 DIAGNOSIS — L299 Pruritus, unspecified: Secondary | ICD-10-CM | POA: Diagnosis not present

## 2021-04-04 DIAGNOSIS — E1129 Type 2 diabetes mellitus with other diabetic kidney complication: Secondary | ICD-10-CM | POA: Diagnosis not present

## 2021-04-04 DIAGNOSIS — D631 Anemia in chronic kidney disease: Secondary | ICD-10-CM | POA: Diagnosis not present

## 2021-04-04 DIAGNOSIS — N186 End stage renal disease: Secondary | ICD-10-CM | POA: Diagnosis not present

## 2021-04-04 DIAGNOSIS — Z992 Dependence on renal dialysis: Secondary | ICD-10-CM | POA: Diagnosis not present

## 2021-04-04 NOTE — TOC Transition Note (Signed)
Transition of care contact from inpatient facility  Date of discharge: 04/02/21 Date of contact: 04/04/21 Method: Attempted Phone Call Spoke to: No Answer  Attempted to call Ms. Kaveney to discuss recent hospitalization but she did not pick up the phone. Unable to leave VM. Message stated: "voice mailbox has not been set up yet".  Plan for renal team to follow-up with patient at [facility] on Tuesday 04/08/21. Her next scheduled HD is this Saturday 04/05/21.   Tobie Poet, NP

## 2021-04-05 DIAGNOSIS — N186 End stage renal disease: Secondary | ICD-10-CM | POA: Diagnosis not present

## 2021-04-05 DIAGNOSIS — L299 Pruritus, unspecified: Secondary | ICD-10-CM | POA: Diagnosis not present

## 2021-04-05 DIAGNOSIS — R519 Headache, unspecified: Secondary | ICD-10-CM | POA: Diagnosis not present

## 2021-04-05 DIAGNOSIS — D689 Coagulation defect, unspecified: Secondary | ICD-10-CM | POA: Diagnosis not present

## 2021-04-05 DIAGNOSIS — E1129 Type 2 diabetes mellitus with other diabetic kidney complication: Secondary | ICD-10-CM | POA: Diagnosis not present

## 2021-04-05 DIAGNOSIS — Z992 Dependence on renal dialysis: Secondary | ICD-10-CM | POA: Diagnosis not present

## 2021-04-05 DIAGNOSIS — D509 Iron deficiency anemia, unspecified: Secondary | ICD-10-CM | POA: Diagnosis not present

## 2021-04-05 DIAGNOSIS — N2581 Secondary hyperparathyroidism of renal origin: Secondary | ICD-10-CM | POA: Diagnosis not present

## 2021-04-05 DIAGNOSIS — D631 Anemia in chronic kidney disease: Secondary | ICD-10-CM | POA: Diagnosis not present

## 2021-04-09 NOTE — Discharge Summary (Signed)
[facility] Discharge Summary   Patient ID:  Kathy Frank MRN: [id number] DOB/AGE: March 17, 1976 45 y.o.  Admit date: 03/28/2021 Discharge date: 04/02/21 Date of Surgery: 03/28/2021 Surgeon: Surgeon(s): Serafina Mitchell, MD  Admission Diagnosis: PAD (peripheral artery disease) ([place]) [I73.9]  Discharge Diagnoses:  PAD (peripheral artery disease) ([place]) [I73.9]  Secondary Diagnoses: Past Medical History:  Diagnosis Date   Anal abscess    chronic   Anxiety    CKD (chronic kidney disease) stage 5, GFR less than 15 ml/min ([place]) 06/19/2016   . Suspect etiologies to include diabetes and hypertension though cannot exclude HIV-associated nephropathy. Advised her against from using excessive Goody powder.   Constipation    trouble stoll coming out   Depression 06/28/2006   Qualifier: Diagnosis of  By: Riccardo Dubin MD, [place]     Diabetes mellitus type 2 in obese ([place]) 06/28/1994   DKA (diabetic ketoacidoses) 12/2014   Dyspnea    uses oxygeb 2 liters per minute at dialysis   End stage renal disease on dialysis [facility])    Erosive esophagitis    Esophageal reflux    ESRD on hemodialysis ([place]) 05/02/2019   Eye redness    Gastroparesis    ? diabetic   GERD (gastroesophageal reflux disease)    History of blood transfusion 2019   History of hypertension    off bp meds 2020 due to low bp with dialysis   Human immunodeficiency virus (HIV) disease ([place]) 04/23/2016   Hypertension    Metabolic bone disease [id number]   Moderate nonproliferative diabetic retinopathy of both eyes ([place]) 11/21/2014   11/14/14: Noted on retinal imaging; needs follow-up imaging in 6 months  05/22/16: Noted on retinal imaging again; needs follow-up imaging in 6 months   Normocytic anemia    Pruritus, unspecified 05/20/2019   Renal failure (ARF), acute on chronic ([place]) 11/24/2018   tues, thurs sat [address] hs since sept 2020   Shortness of breath 07/01/2019   when lays doan at night, musy prop head  up on 2 regular and 2 small pillows   Type 2 diabetes mellitus with diabetic peripheral angiopathy without gangrene ([place]) 05/01/2019   Type II diabetes mellitus ([place]) 1994   diagnosed around 1994   Wears dentures    lower   Wound dehiscence    Wound infection s/p L transmetatarsal amputation     Procedure(s): REVISION OF RIGHT BELOW KNEE AMPUTATION  Discharged Condition: good  HPI: This is a 45 y.o. female who has a non healing right BKA incision with ischemic changes.  Malodor and clear drainage.    Hospital Course:  Kathy Frank is a 45 y.o. female is S/P Right Procedure(s): REVISION OF RIGHT BELOW KNEE AMPUTATION Pain control issues were controlled prior to discharge.  The revision stump was healing well and appears viable.  Once her pain was controlled she was mobility and stable for discharge home. Will have follow up arranged in 4 weeks for staple removal.   Consults:  Treatment Team:  Madelon Lips, MD Roney Jaffe, MD  Significant Diagnostic Studies: CBC Lab Results  Component Value Date   WBC 8.5 04/02/2021   HGB 8.0 (L) 04/02/2021   HCT 23.9 (L) 04/02/2021   MCV 93.7 04/02/2021   PLT 253 04/02/2021    BMET    Component Value Date/Time   NA 127 (L) 04/01/2021 1125   NA 138 12/23/2017 1558   K 6.4 (HH) 04/01/2021 1125   CL 91 (L) 04/01/2021 1125   CO2  23 04/01/2021 1125   GLUCOSE 124 (H) 04/01/2021 1125   BUN 68 (H) 04/01/2021 1125   BUN 28 (H) 12/23/2017 1558   CREATININE 10.01 (H) 04/01/2021 1125   CREATININE 12.07 (H) 05/29/2020 1155   CALCIUM 9.5 04/01/2021 1125   CALCIUM 8.5 (L) 11/25/2018 0309   GFRNONAA 4 (L) 04/01/2021 1125   GFRNONAA 79 11/14/2014 1154   GFRAA 5 (L) 01/06/2020 1329   GFRAA >89 11/14/2014 1154   COAG Lab Results  Component Value Date   INR 1.0 03/28/2021   INR 1.3 (H) 01/28/2021   INR 1.1 05/03/2019     Disposition:  Discharge to :Home Discharge Instructions     Call MD for:  redness, tenderness, or  signs of infection (pain, swelling, bleeding, redness, odor or green/yellow discharge around incision site)   Complete by: As directed    Call MD for:  redness, tenderness, or signs of infection (pain, swelling, bleeding, redness, odor or green/yellow discharge around incision site)   Complete by: As directed    Call MD for:  redness, tenderness, or signs of infection (pain, swelling, redness, odor or green/yellow discharge around incision site)   Complete by: As directed    Call MD for:  severe or increased pain, loss or decreased feeling  in affected limb(s)   Complete by: As directed    Call MD for:  severe or increased pain, loss or decreased feeling  in affected limb(s)   Complete by: As directed    Call MD for:  severe uncontrolled pain   Complete by: As directed    Call MD for:  temperature >100.4   Complete by: As directed    Call MD for:  temperature >100.5   Complete by: As directed    Call MD for:  temperature >100.5   Complete by: As directed    Diet - low sodium heart healthy   Complete by: As directed    Discharge patient   Complete by: As directed    Discharge disposition: 01-Home or Self Care   Discharge patient date: 03/30/2021   Discharge wound care:   Complete by: As directed    Clean right leg staples with mild soap and water, pat dry. Apply gauze wrap as needed. Apply stump stocking and Ampushield to keep leg protected   Increase activity slowly   Complete by: As directed    Resume previous diet   Complete by: As directed    Resume previous diet   Complete by: As directed       Allergies as of 04/02/2021   No Known Allergies      Medication List     STOP taking these medications    acetaminophen 325 MG tablet Commonly known as: TYLENOL       TAKE these medications    amLODipine 5 MG tablet Commonly known as: NORVASC Take 1 tablet (5 mg total) by mouth daily.   amoxicillin 250 MG capsule Commonly known as: AMOXIL Take 1 capsule (250 mg  total) by mouth 2 (two) times daily.   aspirin EC 81 MG tablet Take 1 tablet (81 mg total) by mouth daily. Swallow whole.   atorvastatin 40 MG tablet Commonly known as: Lipitor Take 1 tablet (40 mg total) by mouth daily.   Biktarvy 50-200-25 MG Tabs tablet Generic drug: bictegravir-emtricitabine-tenofovir AF TAKE 1 TABLET BY MOUTH DAILY.   calcitRIOL 0.25 MCG capsule Commonly known as: ROCALTROL Take 1 capsule (0.25 mcg total) by mouth Every Tuesday,Thursday,and Saturday with dialysis.  What changed:  how much to take when to take this   calcium acetate 667 MG capsule Commonly known as: PHOSLO Take 3 capsules (2,001 mg total) by mouth 3 (three) times daily with meals.   clopidogrel 75 MG tablet Commonly known as: PLAVIX Take 1 tablet (75 mg total) by mouth daily.   gabapentin 100 MG capsule Commonly known as: NEURONTIN Take 1 capsule (100 mg total) by mouth 3 (three) times a week. What changed:  how much to take when to take this additional instructions   Lantus SoloStar 100 UNIT/ML Solostar Pen Generic drug: insulin glargine Inject 8 Units into the skin at bedtime. What changed: how much to take   multivitamin Tabs tablet Take 1 tablet by mouth daily.   oxyCODONE 5 MG immediate release tablet Commonly known as: Oxy IR/ROXICODONE Take 1-2 tablets (5-10 mg total) by mouth every 6 (six) hours as needed for severe pain.   pantoprazole 40 MG tablet Commonly known as: PROTONIX Take 1 tablet (40 mg total) by mouth daily.   Pentips 32G X 4 MM Misc Generic drug: Insulin Pen Needle Use as directed   polyethylene glycol 17 g packet Commonly known as: MIRALAX / GLYCOLAX Take 17 g by mouth 2 (two) times daily. What changed:  when to take this reasons to take this   ProAir HFA 108 (90 Base) MCG/ACT inhaler Generic drug: albuterol Inhale 2 puffs into the lungs every 4 (four) hours as needed for wheezing or shortness of breath.   sertraline 50 MG tablet Commonly  known as: Zoloft Take 1 tablet (50 mg total) by mouth at bedtime.               Discharge Care Instructions  (From admission, onward)           Start     Ordered   04/01/21 0000  Discharge wound care:       Comments: Clean right leg staples with mild soap and water, pat dry. Apply gauze wrap as needed. Apply stump stocking and Ampushield to keep leg protected   04/01/21 0730           Verbal and written Discharge instructions given to the patient. Wound care per Discharge AVS  Follow-up Information     [facility] Follow up in 1 month(s).   Why: The office will call the patient with an appointment (sent) Contact information: [place] [id number]                Signed: Roxy Horseman 04/09/2021, 7:49 AM

## 2021-04-10 DIAGNOSIS — Z992 Dependence on renal dialysis: Secondary | ICD-10-CM | POA: Diagnosis not present

## 2021-04-10 DIAGNOSIS — D689 Coagulation defect, unspecified: Secondary | ICD-10-CM | POA: Diagnosis not present

## 2021-04-10 DIAGNOSIS — L299 Pruritus, unspecified: Secondary | ICD-10-CM | POA: Diagnosis not present

## 2021-04-10 DIAGNOSIS — N2581 Secondary hyperparathyroidism of renal origin: Secondary | ICD-10-CM | POA: Diagnosis not present

## 2021-04-10 DIAGNOSIS — Z89612 Acquired absence of left leg above knee: Secondary | ICD-10-CM | POA: Diagnosis not present

## 2021-04-10 DIAGNOSIS — D509 Iron deficiency anemia, unspecified: Secondary | ICD-10-CM | POA: Diagnosis not present

## 2021-04-10 DIAGNOSIS — D631 Anemia in chronic kidney disease: Secondary | ICD-10-CM | POA: Diagnosis not present

## 2021-04-10 DIAGNOSIS — Z89511 Acquired absence of right leg below knee: Secondary | ICD-10-CM | POA: Diagnosis not present

## 2021-04-10 DIAGNOSIS — N186 End stage renal disease: Secondary | ICD-10-CM | POA: Diagnosis not present

## 2021-04-10 DIAGNOSIS — E1169 Type 2 diabetes mellitus with other specified complication: Secondary | ICD-10-CM | POA: Diagnosis not present

## 2021-04-12 DIAGNOSIS — D509 Iron deficiency anemia, unspecified: Secondary | ICD-10-CM | POA: Diagnosis not present

## 2021-04-12 DIAGNOSIS — N2581 Secondary hyperparathyroidism of renal origin: Secondary | ICD-10-CM | POA: Diagnosis not present

## 2021-04-12 DIAGNOSIS — D689 Coagulation defect, unspecified: Secondary | ICD-10-CM | POA: Diagnosis not present

## 2021-04-12 DIAGNOSIS — D631 Anemia in chronic kidney disease: Secondary | ICD-10-CM | POA: Diagnosis not present

## 2021-04-12 DIAGNOSIS — Z992 Dependence on renal dialysis: Secondary | ICD-10-CM | POA: Diagnosis not present

## 2021-04-12 DIAGNOSIS — N186 End stage renal disease: Secondary | ICD-10-CM | POA: Diagnosis not present

## 2021-04-12 DIAGNOSIS — L299 Pruritus, unspecified: Secondary | ICD-10-CM | POA: Diagnosis not present

## 2021-04-15 DIAGNOSIS — D631 Anemia in chronic kidney disease: Secondary | ICD-10-CM | POA: Diagnosis not present

## 2021-04-15 DIAGNOSIS — D689 Coagulation defect, unspecified: Secondary | ICD-10-CM | POA: Diagnosis not present

## 2021-04-15 DIAGNOSIS — N2581 Secondary hyperparathyroidism of renal origin: Secondary | ICD-10-CM | POA: Diagnosis not present

## 2021-04-15 DIAGNOSIS — N186 End stage renal disease: Secondary | ICD-10-CM | POA: Diagnosis not present

## 2021-04-15 DIAGNOSIS — Z992 Dependence on renal dialysis: Secondary | ICD-10-CM | POA: Diagnosis not present

## 2021-04-15 DIAGNOSIS — L299 Pruritus, unspecified: Secondary | ICD-10-CM | POA: Diagnosis not present

## 2021-04-15 DIAGNOSIS — D509 Iron deficiency anemia, unspecified: Secondary | ICD-10-CM | POA: Diagnosis not present

## 2021-04-17 ENCOUNTER — Emergency Department (HOSPITAL_COMMUNITY): Payer: Medicare Other

## 2021-04-17 ENCOUNTER — Other Ambulatory Visit: Payer: Self-pay

## 2021-04-17 ENCOUNTER — Inpatient Hospital Stay (HOSPITAL_COMMUNITY)
Admission: EM | Admit: 2021-04-17 | Discharge: 2021-04-22 | DRG: 564 | Disposition: A | Discharge status: Home Health | Payer: Medicare Other | Attending: Internal Medicine | Admitting: Internal Medicine

## 2021-04-17 DIAGNOSIS — E785 Hyperlipidemia, unspecified: Secondary | ICD-10-CM | POA: Diagnosis present

## 2021-04-17 DIAGNOSIS — Z7902 Long term (current) use of antithrombotics/antiplatelets: Secondary | ICD-10-CM | POA: Diagnosis not present

## 2021-04-17 DIAGNOSIS — Z992 Dependence on renal dialysis: Secondary | ICD-10-CM

## 2021-04-17 DIAGNOSIS — Y838 Other surgical procedures as the cause of abnormal reaction of the patient, or of later complication, without mention of misadventure at the time of the procedure: Secondary | ICD-10-CM | POA: Diagnosis present

## 2021-04-17 DIAGNOSIS — Y835 Amputation of limb(s) as the cause of abnormal reaction of the patient, or of later complication, without mention of misadventure at the time of the procedure: Secondary | ICD-10-CM | POA: Diagnosis present

## 2021-04-17 DIAGNOSIS — B20 Human immunodeficiency virus [HIV] disease: Secondary | ICD-10-CM | POA: Diagnosis present

## 2021-04-17 DIAGNOSIS — Z89511 Acquired absence of right leg below knee: Secondary | ICD-10-CM | POA: Diagnosis not present

## 2021-04-17 DIAGNOSIS — F32A Depression, unspecified: Secondary | ICD-10-CM | POA: Diagnosis present

## 2021-04-17 DIAGNOSIS — N186 End stage renal disease: Secondary | ICD-10-CM | POA: Diagnosis not present

## 2021-04-17 DIAGNOSIS — Z810 Family history of intellectual disabilities: Secondary | ICD-10-CM

## 2021-04-17 DIAGNOSIS — Z833 Family history of diabetes mellitus: Secondary | ICD-10-CM

## 2021-04-17 DIAGNOSIS — E1122 Type 2 diabetes mellitus with diabetic chronic kidney disease: Secondary | ICD-10-CM | POA: Diagnosis present

## 2021-04-17 DIAGNOSIS — T8781 Dehiscence of amputation stump: Secondary | ICD-10-CM | POA: Diagnosis not present

## 2021-04-17 DIAGNOSIS — T8743 Infection of amputation stump, right lower extremity: Secondary | ICD-10-CM | POA: Diagnosis not present

## 2021-04-17 DIAGNOSIS — Z7982 Long term (current) use of aspirin: Secondary | ICD-10-CM

## 2021-04-17 DIAGNOSIS — E1143 Type 2 diabetes mellitus with diabetic autonomic (poly)neuropathy: Secondary | ICD-10-CM | POA: Diagnosis present

## 2021-04-17 DIAGNOSIS — G8918 Other acute postprocedural pain: Secondary | ICD-10-CM | POA: Diagnosis not present

## 2021-04-17 DIAGNOSIS — T8149XA Infection following a procedure, other surgical site, initial encounter: Secondary | ICD-10-CM | POA: Diagnosis present

## 2021-04-17 DIAGNOSIS — Z23 Encounter for immunization: Secondary | ICD-10-CM

## 2021-04-17 DIAGNOSIS — I120 Hypertensive chronic kidney disease with stage 5 chronic kidney disease or end stage renal disease: Secondary | ICD-10-CM | POA: Diagnosis present

## 2021-04-17 DIAGNOSIS — Z20822 Contact with and (suspected) exposure to covid-19: Secondary | ICD-10-CM | POA: Diagnosis present

## 2021-04-17 DIAGNOSIS — T879 Unspecified complications of amputation stump: Secondary | ICD-10-CM | POA: Diagnosis present

## 2021-04-17 DIAGNOSIS — D631 Anemia in chronic kidney disease: Secondary | ICD-10-CM | POA: Diagnosis present

## 2021-04-17 DIAGNOSIS — Z87891 Personal history of nicotine dependence: Secondary | ICD-10-CM | POA: Diagnosis not present

## 2021-04-17 DIAGNOSIS — Z794 Long term (current) use of insulin: Secondary | ICD-10-CM

## 2021-04-17 DIAGNOSIS — Z79899 Other long term (current) drug therapy: Secondary | ICD-10-CM

## 2021-04-17 DIAGNOSIS — N2581 Secondary hyperparathyroidism of renal origin: Secondary | ICD-10-CM | POA: Diagnosis not present

## 2021-04-17 DIAGNOSIS — W19XXXA Unspecified fall, initial encounter: Secondary | ICD-10-CM | POA: Diagnosis present

## 2021-04-17 DIAGNOSIS — E1151 Type 2 diabetes mellitus with diabetic peripheral angiopathy without gangrene: Secondary | ICD-10-CM | POA: Diagnosis present

## 2021-04-17 DIAGNOSIS — Z89512 Acquired absence of left leg below knee: Secondary | ICD-10-CM

## 2021-04-17 DIAGNOSIS — T8131XA Disruption of external operation (surgical) wound, not elsewhere classified, initial encounter: Secondary | ICD-10-CM | POA: Diagnosis not present

## 2021-04-17 DIAGNOSIS — K3184 Gastroparesis: Secondary | ICD-10-CM | POA: Diagnosis not present

## 2021-04-17 DIAGNOSIS — T8130XA Disruption of wound, unspecified, initial encounter: Secondary | ICD-10-CM

## 2021-04-17 DIAGNOSIS — D509 Iron deficiency anemia, unspecified: Secondary | ICD-10-CM | POA: Diagnosis not present

## 2021-04-17 DIAGNOSIS — E119 Type 2 diabetes mellitus without complications: Secondary | ICD-10-CM | POA: Diagnosis not present

## 2021-04-17 DIAGNOSIS — K219 Gastro-esophageal reflux disease without esophagitis: Secondary | ICD-10-CM | POA: Diagnosis present

## 2021-04-17 DIAGNOSIS — Z043 Encounter for examination and observation following other accident: Secondary | ICD-10-CM | POA: Diagnosis not present

## 2021-04-17 DIAGNOSIS — F419 Anxiety disorder, unspecified: Secondary | ICD-10-CM | POA: Diagnosis present

## 2021-04-17 DIAGNOSIS — T8189XA Other complications of procedures, not elsewhere classified, initial encounter: Secondary | ICD-10-CM | POA: Diagnosis not present

## 2021-04-17 DIAGNOSIS — E1152 Type 2 diabetes mellitus with diabetic peripheral angiopathy with gangrene: Secondary | ICD-10-CM | POA: Diagnosis not present

## 2021-04-17 LAB — CBC WITH DIFFERENTIAL/PLATELET
Abs Immature Granulocytes: 0.01 10*3/uL (ref 0.00–0.07)
Basophils Absolute: 0.1 10*3/uL (ref 0.0–0.1)
Basophils Relative: 1 %
Eosinophils Absolute: 0.4 10*3/uL (ref 0.0–0.5)
Eosinophils Relative: 7 %
HCT: 32 % — ABNORMAL LOW (ref 36.0–46.0)
Hemoglobin: 10 g/dL — ABNORMAL LOW (ref 12.0–15.0)
Immature Granulocytes: 0 %
Lymphocytes Relative: 18 %
Lymphs Abs: 1 10*3/uL (ref 0.7–4.0)
MCH: 30.2 pg (ref 26.0–34.0)
MCHC: 31.3 g/dL (ref 30.0–36.0)
MCV: 96.7 fL (ref 80.0–100.0)
Monocytes Absolute: 0.3 10*3/uL (ref 0.1–1.0)
Monocytes Relative: 6 %
Neutro Abs: 3.9 10*3/uL (ref 1.7–7.7)
Neutrophils Relative %: 68 %
Platelets: 439 10*3/uL — ABNORMAL HIGH (ref 150–400)
RBC: 3.31 MIL/uL — ABNORMAL LOW (ref 3.87–5.11)
RDW: 16.8 % — ABNORMAL HIGH (ref 11.5–15.5)
WBC: 5.7 10*3/uL (ref 4.0–10.5)
nRBC: 0 % (ref 0.0–0.2)

## 2021-04-17 LAB — BASIC METABOLIC PANEL
Anion gap: 16 — ABNORMAL HIGH (ref 5–15)
BUN: 28 mg/dL — ABNORMAL HIGH (ref 6–20)
CO2: 25 mmol/L (ref 22–32)
Calcium: 8.5 mg/dL — ABNORMAL LOW (ref 8.9–10.3)
Chloride: 93 mmol/L — ABNORMAL LOW (ref 98–111)
Creatinine, Ser: 8.05 mg/dL — ABNORMAL HIGH (ref 0.44–1.00)
GFR, Estimated: 6 mL/min — ABNORMAL LOW (ref >60)
Glucose, Bld: 213 mg/dL — ABNORMAL HIGH (ref 70–99)
Potassium: 4.1 mmol/L (ref 3.5–5.1)
Sodium: 134 mmol/L — ABNORMAL LOW (ref 135–145)

## 2021-04-17 LAB — RESP PANEL BY RT-PCR (FLU A&B, COVID) ARPGX2
Influenza A by PCR: NEGATIVE
Influenza B by PCR: NEGATIVE
SARS Coronavirus 2 by RT PCR: NEGATIVE

## 2021-04-17 LAB — I-STAT BETA HCG BLOOD, ED (MC, WL, AP ONLY): I-stat hCG, quantitative: <5 m[IU]/mL (ref <5)

## 2021-04-17 MED ORDER — GUAIFENESIN-DM 100-10 MG/5ML PO SYRP: 15.0000 mL | ORAL_SOLUTION | ORAL | Status: DC | PRN

## 2021-04-17 MED ORDER — SERTRALINE HCL 50 MG PO TABS
50.0000 mg | ORAL_TABLET | Freq: Every day | ORAL | Status: DC
  Administered 2021-04-18 – 2021-04-21 (×5): 50 mg via ORAL
  Filled 2021-04-17 (×5): qty 1

## 2021-04-17 MED ORDER — HYDROMORPHONE HCL 1 MG/ML IJ SOLN
0.5000 mg | INTRAMUSCULAR | Status: DC | PRN
Start: 2021-04-17 — End: 2021-04-18
  Administered 2021-04-18 (×3): 1 mg via INTRAVENOUS
  Filled 2021-04-17 (×3): qty 1

## 2021-04-17 MED ORDER — ATORVASTATIN CALCIUM 40 MG PO TABS
40.0000 mg | ORAL_TABLET | Freq: Every day | ORAL | Status: DC
  Administered 2021-04-18 – 2021-04-22 (×5): 40 mg via ORAL
  Filled 2021-04-17 (×6): qty 1

## 2021-04-17 MED ORDER — AMLODIPINE BESYLATE 5 MG PO TABS
5.0000 mg | ORAL_TABLET | Freq: Every day | ORAL | Status: DC
  Administered 2021-04-18 – 2021-04-20 (×3): 5 mg via ORAL
  Filled 2021-04-17 (×5): qty 1

## 2021-04-17 MED ORDER — HYDRALAZINE HCL 20 MG/ML IJ SOLN
5.0000 mg | INTRAMUSCULAR | Status: DC | PRN
Start: 2021-04-17 — End: 2021-04-23

## 2021-04-17 MED ORDER — INSULIN ASPART 100 UNIT/ML IJ SOLN
0.0000 [IU] | Freq: Three times a day (TID) | INTRAMUSCULAR | Status: DC
  Administered 2021-04-18 – 2021-04-19 (×3): 2 [IU] via SUBCUTANEOUS
  Administered 2021-04-20: 3 [IU] via SUBCUTANEOUS
  Administered 2021-04-20: 1 [IU] via SUBCUTANEOUS
  Administered 2021-04-21 (×3): 2 [IU] via SUBCUTANEOUS
  Administered 2021-04-22: 3 [IU] via SUBCUTANEOUS
  Administered 2021-04-22: 2 [IU] via SUBCUTANEOUS

## 2021-04-17 MED ORDER — PHENOL 1.4 % MT LIQD: 1.0000 | OROMUCOSAL | Status: DC | PRN

## 2021-04-17 MED ORDER — CALCITRIOL 0.25 MCG PO CAPS
0.2500 ug | ORAL_CAPSULE | ORAL | Status: DC
  Administered 2021-04-19 – 2021-04-22 (×2): 0.25 ug via ORAL
  Filled 2021-04-17 (×2): qty 1

## 2021-04-17 MED ORDER — ASPIRIN EC 81 MG PO TBEC
81.0000 mg | DELAYED_RELEASE_TABLET | Freq: Every day | ORAL | Status: DC
  Administered 2021-04-18 – 2021-04-22 (×5): 81 mg via ORAL
  Filled 2021-04-17 (×6): qty 1

## 2021-04-17 MED ORDER — POLYETHYLENE GLYCOL 3350 17 G PO PACK: 17.0000 g | PACK | Freq: Every day | ORAL | Status: DC | PRN

## 2021-04-17 MED ORDER — CALCIUM ACETATE (PHOS BINDER) 667 MG PO CAPS
2001.0000 mg | ORAL_CAPSULE | Freq: Three times a day (TID) | ORAL | Status: DC
  Administered 2021-04-19 – 2021-04-22 (×10): 2001 mg via ORAL
  Filled 2021-04-17 (×11): qty 3

## 2021-04-17 MED ORDER — INSULIN GLARGINE-YFGN 100 UNIT/ML ~~LOC~~ SOLN
10.0000 [IU] | Freq: Every day | SUBCUTANEOUS | Status: DC
  Administered 2021-04-18 – 2021-04-19 (×2): 10 [IU] via SUBCUTANEOUS
  Filled 2021-04-17 (×4): qty 0.1

## 2021-04-17 MED ORDER — OXYCODONE-ACETAMINOPHEN 5-325 MG PO TABS
2.0000 | ORAL_TABLET | Freq: Once | ORAL | Status: AC
  Administered 2021-04-17: 2 via ORAL
  Filled 2021-04-17: qty 2

## 2021-04-17 MED ORDER — GABAPENTIN 100 MG PO CAPS
100.0000 mg | ORAL_CAPSULE | ORAL | Status: DC
  Filled 2021-04-17: qty 1

## 2021-04-17 MED ORDER — LABETALOL HCL 5 MG/ML IV SOLN
10.0000 mg | INTRAVENOUS | Status: DC | PRN
Start: 2021-04-17 — End: 2021-04-23
  Administered 2021-04-20 – 2021-04-21 (×2): 10 mg via INTRAVENOUS
  Filled 2021-04-17 (×2): qty 4

## 2021-04-17 MED ORDER — ALBUTEROL SULFATE (2.5 MG/3ML) 0.083% IN NEBU: 3.0000 mL | INHALATION_SOLUTION | RESPIRATORY_TRACT | Status: DC | PRN

## 2021-04-17 MED ORDER — INSULIN GLARGINE 100 UNIT/ML SOLOSTAR PEN: 10.0000 [IU] | PEN_INJECTOR | Freq: Every day | SUBCUTANEOUS | Status: DC

## 2021-04-17 MED ORDER — RENA-VITE PO TABS
1.0000 | ORAL_TABLET | Freq: Every day | ORAL | Status: DC
  Administered 2021-04-18 – 2021-04-22 (×5): 1 via ORAL
  Filled 2021-04-17 (×6): qty 1

## 2021-04-17 MED ORDER — PANTOPRAZOLE SODIUM 40 MG PO TBEC
40.0000 mg | DELAYED_RELEASE_TABLET | Freq: Every day | ORAL | Status: DC
  Administered 2021-04-19 – 2021-04-22 (×4): 40 mg via ORAL
  Filled 2021-04-17 (×5): qty 1

## 2021-04-17 MED ORDER — OXYCODONE-ACETAMINOPHEN 5-325 MG PO TABS: 1.0000 | ORAL_TABLET | ORAL | Status: DC | PRN

## 2021-04-17 MED ORDER — BICTEGRAVIR-EMTRICITAB-TENOFOV 50-200-25 MG PO TABS
1.0000 | ORAL_TABLET | Freq: Every day | ORAL | Status: DC
  Administered 2021-04-18 – 2021-04-22 (×5): 1 via ORAL
  Filled 2021-04-17 (×6): qty 1

## 2021-04-17 MED ORDER — METOPROLOL TARTRATE 5 MG/5ML IV SOLN: 2.0000 mg | INTRAVENOUS | Status: DC | PRN

## 2021-04-17 MED ORDER — ACETAMINOPHEN 325 MG RE SUPP
325.0000 mg | RECTAL | Status: DC | PRN
  Filled 2021-04-17: qty 2

## 2021-04-17 MED ORDER — ONDANSETRON HCL 4 MG/2ML IJ SOLN
4.0000 mg | Freq: Four times a day (QID) | INTRAMUSCULAR | Status: DC | PRN
  Administered 2021-04-19 – 2021-04-21 (×4): 4 mg via INTRAVENOUS
  Filled 2021-04-17 (×4): qty 2

## 2021-04-17 MED ORDER — AMLODIPINE BESYLATE 5 MG PO TABS
5.0000 mg | ORAL_TABLET | Freq: Once | ORAL | Status: AC
  Administered 2021-04-17: 5 mg via ORAL
  Filled 2021-04-17: qty 1

## 2021-04-17 MED ORDER — CLOPIDOGREL BISULFATE 75 MG PO TABS
75.0000 mg | ORAL_TABLET | Freq: Every day | ORAL | Status: DC
  Administered 2021-04-18 – 2021-04-22 (×5): 75 mg via ORAL
  Filled 2021-04-17 (×6): qty 1

## 2021-04-17 MED ORDER — ACETAMINOPHEN 325 MG PO TABS
325.0000 mg | ORAL_TABLET | ORAL | Status: DC | PRN
  Administered 2021-04-21: 650 mg via ORAL
  Filled 2021-04-17: qty 2

## 2021-04-17 NOTE — ED Provider Notes (Signed)
[facility] EMERGENCY DEPARTMENT Provider Note   CSN: [id number] Arrival date & time: 04/17/21  1132     History Chief Complaint  Patient presents with   Wound Check    Kathy Frank is a 45 y.o. female.  Patient with history of end-stage renal disease, HIV, bilateral lower extremity amputations, recent right below the knee amputation revision due to infection --presents the emergency department with wound dehiscence after a fall this morning.  Patient states that she fell directly onto the stump after getting out of bed.  Prior, she had been cleaning the surgical wound daily and has not noted any drainage or recurrent odor as she did with previous infection.  After the fall today, she had some bleeding and the wound was bandaged.  She denies other injury.  She did not call her surgeon's office.  She took an oxycodone several hours ago. The onset of this condition was acute. The course is constant. Aggravating factors: none. Alleviating factors: none.        Past Medical History:  Diagnosis Date   Anal abscess    chronic   Anxiety    CKD (chronic kidney disease) stage 5, GFR less than 15 ml/min (HCC) 06/19/2016   . Suspect etiologies to include diabetes and hypertension though cannot exclude HIV-associated nephropathy. Advised her against from using excessive Goody powder.   Constipation    trouble stoll coming out   Depression 06/28/2006   Qualifier: Diagnosis of  By: Riccardo Dubin MD, Todd     Diabetes mellitus type 2 in obese ([place]) 06/28/1994   DKA (diabetic ketoacidoses) 12/2014   Dyspnea    uses oxygeb 2 liters per minute at dialysis   End stage renal disease on dialysis [facility])    Erosive esophagitis    Esophageal reflux    ESRD on hemodialysis ([place]) 05/02/2019   Eye redness    Gastroparesis    ? diabetic   GERD (gastroesophageal reflux disease)    History of blood transfusion 2019   History of hypertension    off bp meds 2020 due to low bp with  dialysis   Human immunodeficiency virus (HIV) disease ([place]) 04/23/2016   Hypertension    Metabolic bone disease [id number]   Moderate nonproliferative diabetic retinopathy of both eyes ([place]) 11/21/2014   11/14/14: Noted on retinal imaging; needs follow-up imaging in 6 months  05/22/16: Noted on retinal imaging again; needs follow-up imaging in 6 months   Normocytic anemia    Pruritus, unspecified 05/20/2019   Renal failure (ARF), acute on chronic ([place]) 11/24/2018   tues, thurs sat [address] hs since sept 2020   Shortness of breath 07/01/2019   when lays doan at night, musy prop head up on 2 regular and 2 small pillows   Type 2 diabetes mellitus with diabetic peripheral angiopathy without gangrene ([place]) 05/01/2019   Type II diabetes mellitus ([place]) 1994   diagnosed around 1994   Wears dentures    lower   Wound dehiscence    Wound infection s/p L transmetatarsal amputation     Patient Active Problem List   Diagnosis Date Noted   PAD (peripheral artery disease) ([name]) 03/28/2021   Right below-knee amputee ([place]) 02/08/2021   Gangrene of right foot ([place]) 01/29/2021   Peripheral arterial disease ([place]) 01/29/2021   Anxiety 05/28/2020   Headache, unspecified 05/02/2020   Patient's other noncompliance with medication regimen 12/28/2019   Carbuncle and furuncle of buttock 12/15/2019   Other hypoglycemia 12/12/2019   Dyspnea  07/01/2019   Iron deficiency anemia, unspecified [id number]   Complication of vascular dialysis catheter 04/24/2019   Major depressive disorder, single episode, unspecified 04/24/2019   Secondary hyperparathyroidism of renal origin ([place]) 04/24/2019   S/P AKA (above knee amputation), left ([place]) 12/16/2018   Anemia due to chronic kidney disease    Moderate protein-calorie malnutrition (HCC)    Adjustment disorder with mixed anxiety and depressed mood 08/15/2018   ESRD (end stage renal disease) ([place])    Fecal incontinence 02/04/2018   Healthcare maintenance 09/17/2017    Aortic atherosclerosis ([place]) 09/30/2016   Diabetic foot ulcer associated with diabetes mellitus due to underlying condition ([place]) 07/08/2016   Human immunodeficiency virus (HIV) disease ([place]) 04/23/2016   Gastroparesis    Moderate nonproliferative diabetic retinopathy of both eyes ([place]) 11/21/2014   Esophageal reflux    Microscopic hematuria 10/31/2014   Hypertension 11/16/2013   Tobacco use disorder 06/28/2006   Depression 06/28/2006   Diabetes mellitus type 2 in obese ([place]) 06/28/1994    Past Surgical History:  Procedure Laterality Date   ABDOMINAL AORTOGRAM W/LOWER EXTREMITY N/A 12/13/2020   Procedure: ABDOMINAL AORTOGRAM W/LOWER EXTREMITY;  Surgeon: Angelia Mould, MD;  Location: [place] [facility];  Service: Cardiovascular;  Laterality: N/A;   AMPUTATION Left 07/08/2018   Procedure: AMPUTATION FORTH RAY LEFT FOOT;  Surgeon: [name], MD;  Location: [place];  Service: Orthopedics;  Laterality: Left;   AMPUTATION Left 08/09/2018   Procedure: Left Transmetatarsal Amputation;  Surgeon: [name], MD;  Location: [place];  Service: Orthopedics;  Laterality: Left;   AMPUTATION Left 10/08/2018   Procedure: LEFT BELOW KNEE AMPUTATION;  Surgeon: [name], MD;  Location: [place];  Service: Orthopedics;  Laterality: Left;   AMPUTATION Left 10/28/2018   Procedure: REVISION BELOW KNEE AMPUTATION;  Surgeon: [name], MD;  Location: [place];  Service: Orthopedics;  Laterality: Left;   AMPUTATION Left 12/16/2018   Procedure: LEFT ABOVE KNEE AMPUTATION;  Surgeon: [name], MD;  Location: [place];  Service: Orthopedics;  Laterality: Left;   AMPUTATION Right 01/31/2021   Procedure: AMPUTATION BELOW KNEE;  Surgeon: Serafina Mitchell, MD;  Location[facility];  Service: Vascular;  Laterality: Right;   AMPUTATION Right 03/28/2021   Procedure: REVISION OF RIGHT BELOW KNEE AMPUTATION;  Surgeon: Serafina Mitchell, MD;  Location: [place];  Service: Vascular;  Laterality: Right;   AV FISTULA  PLACEMENT Left 10/14/2018   Procedure: Arteriovenous (Av) Fistula Creation Left Arm;  Surgeon: [name], MD;  Location: [place];  Service: Vascular;  Laterality: Left;   [place] Left 04/14/2019   Procedure: BASILIC VEIN TRANSPOSITION SECOND STAGE LEFT ARM;  Surgeon: Rosetta Posner, MD;  Location: [place];  Service: Vascular;  Laterality: Left;   INCISION AND DRAINAGE ABSCESS N/A 10/24/2020   Procedure: exicision of hydradenitis;  Surgeon: Leighton Ruff, MD;  Location: [place];  Service: General;  Laterality: N/A;  45 min   INSERTION OF DIALYSIS CATHETER Right 04/14/2019   Procedure: INSERTION OF DIALYSIS CATHETER;  Surgeon: Rosetta Posner, MD;  Location: [place];  Service: Vascular;  Laterality: Right;   LOWER EXTREMITY ANGIOGRAPHY N/A 07/05/2018   Procedure: LOWER EXTREMITY ANGIOGRAPHY;  Surgeon: Serafina Mitchell, MD;  Location: [place] [facility];  Service: Cardiovascular;  Laterality: N/A;   PERIPHERAL VASCULAR ATHERECTOMY Right 12/13/2020   Procedure: PERIPHERAL VASCULAR ATHERECTOMY;  Surgeon: Angelia Mould, MD;  Location: [place] [facility];  Service: Cardiovascular;  Laterality: Right;  Superficial femoral   PERIPHERAL VASCULAR BALLOON ANGIOPLASTY Left 07/05/2018   Procedure: PERIPHERAL VASCULAR BALLOON ANGIOPLASTY;  Surgeon: Serafina Mitchell, MD;  Location: [place] [facility];  Service: Cardiovascular;  Laterality: Left;  SFA   PERIPHERAL VASCULAR BALLOON ANGIOPLASTY Right 12/13/2020   Procedure: PERIPHERAL VASCULAR BALLOON ANGIOPLASTY;  Surgeon: Angelia Mould, MD;  Location: [place] [facility];  Service: Cardiovascular;  Laterality: Right;  Peroneal artery, anterior tibial artery   RECTAL EXAM UNDER ANESTHESIA N/A 10/24/2020   Procedure: EXAM UNDER ANESTHESIA;  Surgeon: Leighton Ruff, MD;  Location[facility];  Service: General;  Laterality: N/A;   STUMP REVISION Left 10/19/2018   Procedure: REVISION LEFT BELOW KNEE  AMPUTATION;  Surgeon: [name], MD;  Location: [place];  Service: Orthopedics;  Laterality: Left;   TUBAL LIGATION  2002     OB History   No obstetric history on file.     Family History  Problem Relation Age of Onset   Diabetes Mother    Diabetes Brother    Diabetes Daughter    Diabetes Daughter    Mental retardation Brother        died from PNA   Diabetes Maternal Grandmother     Social History   Tobacco Use   Smoking status: Former    Packs/day: 0.10    Years: 10.00    Pack years: 1.00    Types: Cigarettes    Start date: 11/17/2013    Quit date: 09/09/2014    Years since quitting: 6.6   Smokeless tobacco: Never  Vaping Use   Vaping Use: Never used  Substance Use Topics   Alcohol use: Not Currently   Drug use: Not Currently    Types: Marijuana    Comment: 2 times a monthy -  `last time [place] 2022    Home Medications Prior to Admission medications   Medication Sig Start Date End Date Taking? Authorizing Provider  albuterol (VENTOLIN HFA) 108 (90 Base) MCG/ACT inhaler Inhale 2 puffs into the lungs every 4 (four) hours as needed for wheezing or shortness of breath. 02/14/21   Angiulli, Lavon Paganini, PA-C  amLODipine (NORVASC) 5 MG tablet Take 1 tablet (5 mg total) by mouth daily. 02/14/21   Angiulli, Lavon Paganini, PA-C  amoxicillin (AMOXIL) 250 MG capsule Take 1 capsule (250 mg total) by mouth 2 (two) times daily. 04/01/21   Ulyses Amor, PA-C  aspirin EC 81 MG tablet Take 1 tablet (81 mg total) by mouth daily. Swallow whole. 12/17/20   Angelia Mould, MD  atorvastatin (LIPITOR) 40 MG tablet Take 1 tablet (40 mg total) by mouth daily. 02/14/21   Angiulli, Lavon Paganini, PA-C  bictegravir-emtricitabine-tenofovir AF (BIKTARVY) 50-200-25 MG TABS tablet TAKE 1 TABLET BY MOUTH DAILY. 02/17/21 02/17/22  Michel Bickers, MD  calcitRIOL (ROCALTROL) 0.25 MCG capsule Take 1 capsule (0.25 mcg total) by mouth Every Tuesday,Thursday,and Saturday with dialysis. 04/01/21   Ulyses Amor, PA-C   calcium acetate (PHOSLO) 667 MG capsule Take 3 capsules (2,001 mg total) by mouth 3 (three) times daily with meals. 02/14/21   Angiulli, Lavon Paganini, PA-C  clopidogrel (PLAVIX) 75 MG tablet Take 1 tablet (75 mg total) by mouth daily. 02/14/21   Angiulli, Lavon Paganini, PA-C  gabapentin (NEURONTIN) 100 MG capsule Take 1 capsule (100 mg total) by mouth 3 (three) times a week. Patient taking differently: Take 100-300 mg by mouth See admin instructions. Take 100 mg on Monday, Wednesday, Friday and Sunday 02/14/21  Angiulli, Lavon Paganini, PA-C  insulin glargine (LANTUS SOLOSTAR) 100 UNIT/ML Solostar Pen Inject 8 Units into the skin at bedtime. Patient taking differently: Inject 10 Units into the skin at bedtime. 02/14/21   Angiulli, Lavon Paganini, PA-C  Insulin Pen Needle (PENTIPS) 32G X 4 MM MISC Use as directed 02/14/21   Meredith Staggers, MD  multivitamin (RENA-VIT) TABS tablet Take 1 tablet by mouth daily. 02/14/21   Angiulli, Lavon Paganini, PA-C  oxyCODONE (OXY IR/ROXICODONE) 5 MG immediate release tablet Take 1-2 tablets (5-10 mg total) by mouth every 6 (six) hours as needed for severe pain. 04/01/21   Ulyses Amor, PA-C  oxyCODONE-acetaminophen (PERCOCET/ROXICET) 5-325 MG tablet Take 1 tablet by mouth every 6 (six) hours as needed. 04/02/21   Ulyses Amor, PA-C  pantoprazole (PROTONIX) 40 MG tablet Take 1 tablet (40 mg total) by mouth daily. 02/14/21   Angiulli, Lavon Paganini, PA-C  polyethylene glycol (MIRALAX / GLYCOLAX) 17 g packet Take 17 g by mouth 2 (two) times daily. Patient taking differently: Take 17 g by mouth daily as needed for mild constipation or moderate constipation. 02/14/21   Angiulli, Lavon Paganini, PA-C  sertraline (ZOLOFT) 50 MG tablet Take 1 tablet (50 mg total) by mouth at bedtime. 02/14/21   Angiulli, Lavon Paganini, PA-C    Allergies    Patient has no known allergies.  Review of Systems   Review of Systems  Constitutional:  Negative for fever.  HENT:  Negative for rhinorrhea and sore throat.   Eyes:  Negative for  redness.  Respiratory:  Negative for cough.   Cardiovascular:  Negative for chest pain.  Gastrointestinal:  Negative for abdominal pain, diarrhea, nausea and vomiting.  Genitourinary:  Negative for dysuria, frequency, hematuria and urgency.  Musculoskeletal:  Negative for myalgias.  Skin:  Positive for wound. Negative for rash.  Neurological:  Negative for headaches.   Physical Exam Updated Vital Signs BP (!) 200/91 (BP Location: Right Arm)   Pulse 90   Temp 99.1 F (37.3 C) (Oral)   Resp 18   SpO2 100%   Physical Exam Vitals and nursing note reviewed.  Constitutional:      Appearance: She is well-developed.  HENT:     Head: Normocephalic and atraumatic.  Eyes:     Conjunctiva/sclera: Conjunctivae normal.  Pulmonary:     Effort: No respiratory distress.  Musculoskeletal:     Cervical back: Normal range of motion and neck supple.  Skin:    General: Skin is warm and dry.     Comments: Surgical wound with central approximately 3 cm of wound dehiscence, dried blood on dressing.  Bleeding likely from where the inferior staple line tore away from the skin.  Neurological:     Mental Status: She is alert.          ED Results / Procedures / Treatments   Labs (all labs ordered are listed, but only abnormal results are displayed) Labs Reviewed  CBC WITH DIFFERENTIAL/PLATELET - Abnormal; Notable for the following components:      Result Value   RBC 3.31 (*)    Hemoglobin 10.0 (*)    HCT 32.0 (*)    RDW 16.8 (*)    Platelets 439 (*)    All other components within normal limits  BASIC METABOLIC PANEL - Abnormal; Notable for the following components:   Sodium 134 (*)    Chloride 93 (*)    Glucose, Bld 213 (*)    BUN 28 (*)    Creatinine,  Ser 8.05 (*)    Calcium 8.5 (*)    GFR, Estimated 6 (*)    Anion gap 16 (*)    All other components within normal limits  I-STAT BETA HCG BLOOD, ED ([facility], [facility], [facility] ONLY)    EKG None  Radiology DG Knee 2 Views Right  Result Date:  04/17/2021 CLINICAL DATA:  Fall on RIGHT knee status below the knee amputation. EXAM: RIGHT KNEE - 1-2 VIEW COMPARISON:  Comparison with MR of the foot and foot x-rays from June of 2022. FINDINGS: Small amount of gas along the distal aspect of the remainder of the tibia. Skin staples in place over the wound from recent revision of infected below-the-knee amputation. Calcifications present in the soft tissues with some periosteal reaction. No sign of acute fracture. Vascular calcifications in the soft tissues. IMPRESSION: Small amount of gas in the stump adjacent to remaining distal tibia, raising the question of soft tissue infection. The patient's latest revision available in the chart is from 03/28/2021. Correlate with any signs of local infection. Calcification forming in the soft tissues in the area may relate to heterotopic ossification. Mild periosteal reaction along the distal tibia medial aspect could reflect osteomyelitis. Electronically Signed   By: Zetta Bills M.D.   On: 04/17/2021 13:19    Procedures Procedures   Medications Ordered in ED Medications - No data to display  ED Course  I have reviewed the triage vital signs and the nursing notes.  Pertinent labs & imaging results that were available during my care of the patient were reviewed by me and considered in my medical decision making (see chart for details).  Patient seen and examined. Work-up and x-ray reviewed. Medications ordered.  Discussed with Dr. Vanita Panda.  Will discuss with vascular surgery for further recommendations.  Vital signs reviewed and are as follows: BP (!) 200/91 (BP Location: Right Arm)   Pulse 90   Temp 99.1 F (37.3 C) (Oral)   Resp 18   SpO2 100%   8:41 PM Spoke with Dr. Virl Cagey of vascular surgery.  He is going to come and see patient.  Plan for admission for revision.  COVID ordered.  N.p.o. after midnight.     MDM Rules/Calculators/A&P                           Admit.   Final Clinical  Impression(s) / ED Diagnoses Final diagnoses:  Wound dehiscence    Rx / DC Orders ED Discharge Orders     None        Suann Larry 04/17/21 2205    Carmin Muskrat, MD 04/18/21 0005

## 2021-04-17 NOTE — ED Provider Notes (Signed)
Emergency Medicine Provider Triage Evaluation Note  Kathy Frank , a 45 y.o. female  was evaluated in triage.  Pt complains of right leg injury. S/p right BKA 3 months ago, revision 3 weeks ago. [place] this morning out of bed onto right stump.   Review of Systems  Positive: Fall, wound Negative: Fevers, chills, rash  Physical Exam  BP (!) 183/81 (BP Location: Right Arm)   Pulse 88   Temp 99.1 F (37.3 C) (Oral)   Resp 18   SpO2 100%  Gen:   Awake, no distress   Resp:  Normal effort  MSK:   Moves extremities without difficulty  Other:  Increased erythema of wound with missing staples and signs of pus drainage  Medical Decision Making  Medically screening exam initiated at 12:31 PM.  Appropriate orders placed.  [name] was informed that the remainder of the evaluation will be completed by another provider, this initial triage assessment does not replace that evaluation, and the importance of remaining in the ED until their evaluation is complete.     Estill Cotta 04/17/21 1234    Tegeler, Gwenyth Allegra, MD 04/17/21 [phone]

## 2021-04-17 NOTE — ED Notes (Signed)
Pt not responding for vital recheck

## 2021-04-17 NOTE — ED Notes (Signed)
Pt not responding to vital recheck

## 2021-04-17 NOTE — H&P (Signed)
Hospital Consult    Reason for Consult: Postoperative wound Requesting Physician: Dr. Carmin Muskrat MRN #:  [id number]  History of Present Illness: This is a 45 y.o. 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  She presents today status post fall with dehisced incision.  Per pt, she fell directly on the stump after getting out of bed this morning.  When she looked down, she noticed that the wound had opened.  Prior to the fall, she denied redness erythema, induration.  She denied fevers, chills.   Past Medical History:  Diagnosis Date   Anal abscess    chronic   Anxiety    CKD (chronic kidney disease) stage 5, GFR less than 15 ml/min (HCC) 06/19/2016   . Suspect etiologies to include diabetes and hypertension though cannot exclude HIV-associated nephropathy. Advised her against from using excessive Goody powder.   Constipation    trouble stoll coming out   Depression 06/28/2006   Qualifier: Diagnosis of  By: Riccardo Dubin MD, [place]     Diabetes mellitus type 2 in obese ([place]) 06/28/1994   DKA (diabetic ketoacidoses) 12/2014   Dyspnea    uses oxygeb 2 liters per minute at dialysis   End stage renal disease on dialysis [facility])    Erosive esophagitis    Esophageal reflux    ESRD on hemodialysis ([place]) 05/02/2019   Eye redness    Gastroparesis    ? diabetic   GERD (gastroesophageal reflux disease)    History of blood transfusion 2019   History of hypertension    off bp meds 2020 due to low bp with dialysis   Human immunodeficiency virus (HIV) disease ([place]) 04/23/2016   Hypertension    Metabolic bone disease [id number]   Moderate nonproliferative diabetic retinopathy of both eyes ([place]) 11/21/2014   11/14/14: Noted on retinal imaging; needs follow-up imaging in 6 months  05/22/16: Noted on retinal imaging again; needs follow-up  imaging in 6 months   Normocytic anemia    Pruritus, unspecified 05/20/2019   Renal failure (ARF), acute on chronic ([place]) 11/24/2018   tues, thurs sat [address] hs since sept 2020   Shortness of breath 07/01/2019   when lays doan at night, musy prop head up on 2 regular and 2 small pillows   Type 2 diabetes mellitus with diabetic peripheral angiopathy without gangrene ([place]) 05/01/2019   Type II diabetes mellitus ([place]) 1994   diagnosed around 1994   Wears dentures    lower   Wound dehiscence    Wound infection s/p L transmetatarsal amputation     Past Surgical History:  Procedure Laterality Date   ABDOMINAL AORTOGRAM W/LOWER EXTREMITY N/A 12/13/2020   Procedure: ABDOMINAL AORTOGRAM W/LOWER EXTREMITY;  Surgeon: Angelia Mould, MD;  Location: [place] [facility];  Service: Cardiovascular;  Laterality: N/A;   AMPUTATION Left 07/08/2018   Procedure: AMPUTATION FORTH RAY LEFT FOOT;  Surgeon: [name], MD;  Location: [place];  Service: Orthopedics;  Laterality: Left;   AMPUTATION Left 08/09/2018   Procedure: Left Transmetatarsal Amputation;  Surgeon: [name], MD;  Location: [place];  Service: Orthopedics;  Laterality: Left;   AMPUTATION Left 10/08/2018   Procedure: LEFT BELOW KNEE AMPUTATION;  Surgeon: [name], MD;  Location: [place];  Service: Orthopedics;  Laterality: Left;   AMPUTATION Left 10/28/2018   Procedure: REVISION BELOW KNEE AMPUTATION;  Surgeon: Meridee Score  V, MD;  Location: [place];  Service: Orthopedics;  Laterality: Left;   AMPUTATION Left 12/16/2018   Procedure: LEFT ABOVE KNEE AMPUTATION;  Surgeon: [name], MD;  Location: [place];  Service: Orthopedics;  Laterality: Left;   AMPUTATION Right 01/31/2021   Procedure: AMPUTATION BELOW KNEE;  Surgeon: Serafina Mitchell, MD;  Location: [facility];  Service: Vascular;  Laterality: Right;   AMPUTATION Right 03/28/2021   Procedure: REVISION OF RIGHT BELOW KNEE AMPUTATION;  Surgeon: Serafina Mitchell, MD;  Location: [place];  Service: Vascular;  Laterality: Right;   AV FISTULA PLACEMENT Left 10/14/2018   Procedure: Arteriovenous (Av) Fistula Creation Left Arm;  Surgeon: [name], MD;  Location: [place];  Service: Vascular;  Laterality: Left;   [place] Left 04/14/2019   Procedure: BASILIC VEIN TRANSPOSITION SECOND STAGE LEFT ARM;  Surgeon: Rosetta Posner, MD;  Location: [place];  Service: Vascular;  Laterality: Left;   INCISION AND DRAINAGE ABSCESS N/A 10/24/2020   Procedure: exicision of hydradenitis;  Surgeon: Leighton Ruff, MD;  Location: [place];  Service: General;  Laterality: N/A;  45 min   INSERTION OF DIALYSIS CATHETER Right 04/14/2019   Procedure: INSERTION OF DIALYSIS CATHETER;  Surgeon: Rosetta Posner, MD;  Location: [place];  Service: Vascular;  Laterality: Right;   LOWER EXTREMITY ANGIOGRAPHY N/A 07/05/2018   Procedure: LOWER EXTREMITY ANGIOGRAPHY;  Surgeon: Serafina Mitchell, MD;  Location: [place] [facility];  Service: Cardiovascular;  Laterality: N/A;   PERIPHERAL VASCULAR ATHERECTOMY Right 12/13/2020   Procedure: PERIPHERAL VASCULAR ATHERECTOMY;  Surgeon: Angelia Mould, MD;  Location: [place] [facility];  Service: Cardiovascular;  Laterality: Right;  Superficial femoral   PERIPHERAL VASCULAR BALLOON ANGIOPLASTY Left 07/05/2018   Procedure: PERIPHERAL VASCULAR BALLOON ANGIOPLASTY;  Surgeon: Serafina Mitchell, MD;  Location: [place] [facility];  Service: Cardiovascular;  Laterality: Left;  SFA   PERIPHERAL VASCULAR BALLOON ANGIOPLASTY Right 12/13/2020   Procedure: PERIPHERAL VASCULAR BALLOON ANGIOPLASTY;  Surgeon: Angelia Mould, MD;  Location: [place] [facility];  Service: Cardiovascular;  Laterality: Right;  Peroneal artery, anterior tibial artery   RECTAL EXAM UNDER ANESTHESIA N/A 10/24/2020   Procedure: EXAM UNDER ANESTHESIA;  Surgeon: Leighton Ruff, MD;  Location: [facility];  Service: General;  Laterality: N/A;   STUMP REVISION  Left 10/19/2018   Procedure: REVISION LEFT BELOW KNEE AMPUTATION;  Surgeon: [name], MD;  Location: [place];  Service: Orthopedics;  Laterality: Left;   TUBAL LIGATION  2002    No Known Allergies  Prior to Admission medications   Medication Sig Start Date End Date Taking? Authorizing Provider  albuterol (VENTOLIN HFA) 108 (90 Base) MCG/ACT inhaler Inhale 2 puffs into the lungs every 4 (four) hours as needed for wheezing or shortness of breath. 02/14/21   Angiulli, Lavon Paganini, PA-C  amLODipine (NORVASC) 5 MG tablet Take 1 tablet (5 mg total) by mouth daily. 02/14/21   Angiulli, Lavon Paganini, PA-C  amoxicillin (AMOXIL) 250 MG capsule Take 1 capsule (250 mg total) by mouth 2 (two) times daily. 04/01/21   Ulyses Amor, PA-C  aspirin EC 81 MG tablet Take 1 tablet (81 mg total) by mouth daily. Swallow whole. 12/17/20   Angelia Mould, MD  atorvastatin (LIPITOR) 40 MG tablet Take 1 tablet (40 mg total) by mouth daily. 02/14/21   Angiulli, Lavon Paganini, PA-C  bictegravir-emtricitabine-tenofovir AF (BIKTARVY) 50-200-25 MG TABS tablet TAKE 1 TABLET BY MOUTH DAILY. 02/17/21 02/17/22  Michel Bickers, MD  calcitRIOL (ROCALTROL) 0.25 MCG capsule Take 1 capsule (0.25 mcg total) by mouth Every Tuesday,Thursday,and Saturday with dialysis. 04/01/21   Ulyses Amor, PA-C  calcium acetate (PHOSLO) 667 MG capsule Take 3 capsules (2,001 mg total) by mouth 3 (three) times daily with meals. 02/14/21   Angiulli, Lavon Paganini, PA-C  clopidogrel (PLAVIX) 75 MG tablet Take 1 tablet (75 mg total) by mouth daily. 02/14/21   Angiulli, Lavon Paganini, PA-C  gabapentin (NEURONTIN) 100 MG capsule Take 1 capsule (100 mg total) by mouth 3 (three) times a week. Patient taking differently: Take 100-300 mg by mouth See admin instructions. Take 100 mg on Monday, Wednesday, Friday and Sunday 02/14/21   Angiulli, Lavon Paganini, PA-C  insulin glargine (LANTUS SOLOSTAR) 100 UNIT/ML Solostar Pen Inject 8 Units into the skin at bedtime. Patient taking differently:  Inject 10 Units into the skin at bedtime. 02/14/21   Angiulli, Lavon Paganini, PA-C  Insulin Pen Needle (PENTIPS) 32G X 4 MM MISC Use as directed 02/14/21   Meredith Staggers, MD  multivitamin (RENA-VIT) TABS tablet Take 1 tablet by mouth daily. 02/14/21   Angiulli, Lavon Paganini, PA-C  oxyCODONE (OXY IR/ROXICODONE) 5 MG immediate release tablet Take 1-2 tablets (5-10 mg total) by mouth every 6 (six) hours as needed for severe pain. 04/01/21   Ulyses Amor, PA-C  oxyCODONE-acetaminophen (PERCOCET/ROXICET) 5-325 MG tablet Take 1 tablet by mouth every 6 (six) hours as needed. 04/02/21   Ulyses Amor, PA-C  pantoprazole (PROTONIX) 40 MG tablet Take 1 tablet (40 mg total) by mouth daily. 02/14/21   Angiulli, Lavon Paganini, PA-C  polyethylene glycol (MIRALAX / GLYCOLAX) 17 g packet Take 17 g by mouth 2 (two) times daily. Patient taking differently: Take 17 g by mouth daily as needed for mild constipation or moderate constipation. 02/14/21   Angiulli, Lavon Paganini, PA-C  sertraline (ZOLOFT) 50 MG tablet Take 1 tablet (50 mg total) by mouth at bedtime. 02/14/21   Angiulli, Lavon Paganini, PA-C    Social History   Socioeconomic History   Marital status: Single    Spouse name: Not on file   Number of children: Not on file   Years of education: 10   Highest education level: Not on file  Occupational History    Employer: UNEMPLOYED  Tobacco Use   Smoking status: Former    Packs/day: 0.10    Years: 10.00    Pack years: 1.00    Types: Cigarettes    Start date: 11/17/2013    Quit date: 09/09/2014    Years since quitting: 6.6   Smokeless tobacco: Never  Vaping Use   Vaping Use: Never used  Substance and Sexual Activity   Alcohol use: Not Currently   Drug use: Not Currently    Types: Marijuana    Comment: 2 times a monthy -  `last time Juily 2022   Sexual activity: Not Currently    Partners: Female, Female  Other Topics Concern   Not on file  Social History Narrative   Not on file   Social Determinants of Health    Financial Resource Strain: Not on file  Food Insecurity: Not on file  Transportation Needs: Not on file  Physical Activity: Not on file  Stress: Not on file  Social Connections: Not on file  Intimate Partner Violence: Not on file   Family History  Problem Relation Age of Onset   Diabetes Mother    Diabetes Brother    Diabetes Daughter  Diabetes Daughter    Mental retardation Brother        died from PNA   Diabetes Maternal Grandmother     ROS: Otherwise negative unless mentioned in HPI  Physical Examination  Vitals:   04/17/21 1850 04/17/21 2115  BP: (!) 200/91 (!) 185/82  Pulse: 90   Resp: 18   Temp:    SpO2: 100%    There is no height or weight on file to calculate BMI.  General:  WDWN in NAD Gait: Not observed HENT: WNL, normocephalic Pulmonary: normal non-labored breathing, without Rales, rhonchi,  wheezing Cardiac: regular, without  Murmurs, rubs or gallops; without carotid bruits Abdomen soft, NT/ND, no masses Skin: without rashes Vascular Exam - Bilateral amputations Extremities:  Right BKA wound dehiscence, purulence appreciated at the incision site. Musculoskeletal: no muscle wasting or atrophy  Neurologic: A&O X 3;  No focal weakness or paresthesias are detected; speech is fluent/normal Psychiatric:  The pt has Normal affect. Lymph:  Unremarkable  CBC    Component Value Date/Time   WBC 5.7 04/17/2021 1240   RBC 3.31 (L) 04/17/2021 1240   HGB 10.0 (L) 04/17/2021 1240   HGB 9.8 (L) 07/18/2018 1133   HCT 32.0 (L) 04/17/2021 1240   HCT 19.3 (L) 10/09/2018 1405   PLT 439 (H) 04/17/2021 1240   PLT 417 07/18/2018 1133   MCV 96.7 04/17/2021 1240   MCV 89 07/18/2018 1133   MCH 30.2 04/17/2021 1240   MCHC 31.3 04/17/2021 1240   RDW 16.8 (H) 04/17/2021 1240   RDW 12.9 07/18/2018 1133   LYMPHSABS 1.0 04/17/2021 1240   LYMPHSABS 1.6 12/23/2017 1558   MONOABS 0.3 04/17/2021 1240   EOSABS 0.4 04/17/2021 1240   EOSABS 0.2 12/23/2017 1558    BASOSABS 0.1 04/17/2021 1240   BASOSABS 0.1 12/23/2017 1558    BMET    Component Value Date/Time   NA 134 (L) 04/17/2021 1240   NA 138 12/23/2017 1558   K 4.1 04/17/2021 1240   CL 93 (L) 04/17/2021 1240   CO2 25 04/17/2021 1240   GLUCOSE 213 (H) 04/17/2021 1240   BUN 28 (H) 04/17/2021 1240   BUN 28 (H) 12/23/2017 1558   CREATININE 8.05 (H) 04/17/2021 1240   CREATININE 12.07 (H) 05/29/2020 1155   CALCIUM 8.5 (L) 04/17/2021 1240   CALCIUM 8.5 (L) 11/25/2018 0309   GFRNONAA 6 (L) 04/17/2021 1240   GFRNONAA 79 11/14/2014 1154   GFRAA 5 (L) 01/06/2020 1329   GFRAA >89 11/14/2014 1154    COAGS: Lab Results  Component Value Date   INR 1.0 03/28/2021   INR 1.3 (H) 01/28/2021   INR 1.1 05/03/2019     Non-Invasive Vascular Imaging:   -  Statin:  Yes.   Beta Blocker:  Yes.   Aspirin:  Yes.   ACEI:  No. ARB:  No. CCB use:  Yes Other antiplatelets/anticoagulants:  Yes.   Plavix   ASSESSMENT/PLAN: This is a 45 y.o. female with recent BKA revision on 03/28/2021 with Dr. Trula Slade, who presents after fall with BKA wound dehiscence.   Patient would benefit from admission for BKA washout / debridement Will start broad-spectrum antibiotics Bedrest N.p.o. midnight     Cassandria Santee MD MS [facility] [phone] 04/17/2021  10:04 PM

## 2021-04-17 NOTE — ED Triage Notes (Signed)
C/O right BKA incision site bleeding after a fall today. Reported site was revised 3 weeks ago.

## 2021-04-18 ENCOUNTER — Encounter (HOSPITAL_COMMUNITY)
Admission: EM | Disposition: A | Discharge status: Home Health | Payer: Self-pay | Source: Home / Self Care | Attending: Internal Medicine

## 2021-04-18 ENCOUNTER — Inpatient Hospital Stay (HOSPITAL_COMMUNITY): Payer: Medicare Other | Admitting: Certified Registered"

## 2021-04-18 ENCOUNTER — Encounter (HOSPITAL_COMMUNITY): Payer: Self-pay | Admitting: Vascular Surgery

## 2021-04-18 DIAGNOSIS — T8781 Dehiscence of amputation stump: Secondary | ICD-10-CM

## 2021-04-18 DIAGNOSIS — T879 Unspecified complications of amputation stump: Secondary | ICD-10-CM | POA: Diagnosis present

## 2021-04-18 HISTORY — PX: AMPUTATION: SHX166

## 2021-04-18 LAB — HEMOGLOBIN A1C
Hgb A1c MFr Bld: 7.1 % — ABNORMAL HIGH (ref 4.8–5.6)
Mean Plasma Glucose: 157.07 mg/dL

## 2021-04-18 LAB — GLUCOSE, CAPILLARY
Glucose-Capillary: 120 mg/dL — ABNORMAL HIGH (ref 70–99)
Glucose-Capillary: 294 mg/dL — ABNORMAL HIGH (ref 70–99)

## 2021-04-18 LAB — CBG MONITORING, ED
Glucose-Capillary: 194 mg/dL — ABNORMAL HIGH (ref 70–99)
Glucose-Capillary: 98 mg/dL (ref 70–99)

## 2021-04-18 LAB — BASIC METABOLIC PANEL
Anion gap: 13 (ref 5–15)
Anion gap: 18 — ABNORMAL HIGH (ref 5–15)
BUN: 39 mg/dL — ABNORMAL HIGH (ref 6–20)
BUN: 46 mg/dL — ABNORMAL HIGH (ref 6–20)
CO2: 27 mmol/L (ref 22–32)
CO2: 21 mmol/L — ABNORMAL LOW (ref 22–32)
Calcium: 8.1 mg/dL — ABNORMAL LOW (ref 8.9–10.3)
Calcium: 8.2 mg/dL — ABNORMAL LOW (ref 8.9–10.3)
Chloride: 95 mmol/L — ABNORMAL LOW (ref 98–111)
Chloride: 96 mmol/L — ABNORMAL LOW (ref 98–111)
Creatinine, Ser: 9.07 mg/dL — ABNORMAL HIGH (ref 0.44–1.00)
Creatinine, Ser: 9.57 mg/dL — ABNORMAL HIGH (ref 0.44–1.00)
GFR, Estimated: 5 mL/min — ABNORMAL LOW (ref >60)
GFR, Estimated: 5 mL/min — ABNORMAL LOW (ref >60)
Glucose, Bld: 190 mg/dL — ABNORMAL HIGH (ref 70–99)
Glucose, Bld: 185 mg/dL — ABNORMAL HIGH (ref 70–99)
Potassium: 4.4 mmol/L (ref 3.5–5.1)
Potassium: 5 mmol/L (ref 3.5–5.1)
Sodium: 135 mmol/L (ref 135–145)
Sodium: 135 mmol/L (ref 135–145)

## 2021-04-18 LAB — CBC
HCT: 31.2 % — ABNORMAL LOW (ref 36.0–46.0)
Hemoglobin: 10 g/dL — ABNORMAL LOW (ref 12.0–15.0)
MCH: 30.5 pg (ref 26.0–34.0)
MCHC: 32.1 g/dL (ref 30.0–36.0)
MCV: 95.1 fL (ref 80.0–100.0)
Platelets: 425 10*3/uL — ABNORMAL HIGH (ref 150–400)
RBC: 3.28 MIL/uL — ABNORMAL LOW (ref 3.87–5.11)
RDW: 17.1 % — ABNORMAL HIGH (ref 11.5–15.5)
WBC: 12.3 10*3/uL — ABNORMAL HIGH (ref 4.0–10.5)
nRBC: 0 % (ref 0.0–0.2)

## 2021-04-18 SURGERY — AMPUTATION BELOW KNEE
Anesthesia: General | Site: Knee | Laterality: Right

## 2021-04-18 MED ORDER — SODIUM CHLORIDE 0.9 % IV SOLN: INTRAVENOUS | Status: DC

## 2021-04-18 MED ORDER — SODIUM CHLORIDE 0.9 % IV SOLN: 100.0000 mL | INTRAVENOUS | Status: DC | PRN

## 2021-04-18 MED ORDER — MIDAZOLAM HCL 2 MG/2ML IJ SOLN
INTRAMUSCULAR | Status: AC
  Filled 2021-04-18: qty 2

## 2021-04-18 MED ORDER — MIDAZOLAM HCL 2 MG/2ML IJ SOLN
INTRAMUSCULAR | Status: DC | PRN
  Administered 2021-04-18: 1 mg via INTRAVENOUS

## 2021-04-18 MED ORDER — PHENYLEPHRINE 40 MCG/ML (10ML) SYRINGE FOR IV PUSH (FOR BLOOD PRESSURE SUPPORT)
PREFILLED_SYRINGE | INTRAVENOUS | Status: DC | PRN
  Administered 2021-04-18: 120 ug via INTRAVENOUS
  Administered 2021-04-18: 80 ug via INTRAVENOUS

## 2021-04-18 MED ORDER — HYDROMORPHONE HCL 1 MG/ML IJ SOLN
0.5000 mg | INTRAMUSCULAR | Status: DC | PRN
  Administered 2021-04-18 – 2021-04-20 (×5): 0.5 mg via INTRAVENOUS
  Filled 2021-04-18 (×6): qty 1

## 2021-04-18 MED ORDER — CHLORHEXIDINE GLUCONATE 0.12 % MT SOLN: 15.0000 mL | Freq: Once | OROMUCOSAL | Status: AC

## 2021-04-18 MED ORDER — FENTANYL CITRATE (PF) 250 MCG/5ML IJ SOLN
INTRAMUSCULAR | Status: DC | PRN
  Administered 2021-04-18: 50 ug via INTRAVENOUS

## 2021-04-18 MED ORDER — BISACODYL 10 MG RE SUPP: 10.0000 mg | Freq: Every day | RECTAL | Status: DC | PRN

## 2021-04-18 MED ORDER — HYDROMORPHONE HCL 1 MG/ML IJ SOLN
INTRAMUSCULAR | Status: AC
  Filled 2021-04-18: qty 1

## 2021-04-18 MED ORDER — LIDOCAINE 2% (20 MG/ML) 5 ML SYRINGE
INTRAMUSCULAR | Status: DC | PRN
  Administered 2021-04-18: 80 mg via INTRAVENOUS

## 2021-04-18 MED ORDER — ONDANSETRON HCL 4 MG/2ML IJ SOLN
INTRAMUSCULAR | Status: DC | PRN
  Administered 2021-04-18: 4 mg via INTRAVENOUS

## 2021-04-18 MED ORDER — HEPARIN SODIUM (PORCINE) 5000 UNIT/ML IJ SOLN
5000.0000 [IU] | Freq: Three times a day (TID) | INTRAMUSCULAR | Status: DC
  Administered 2021-04-19 – 2021-04-22 (×11): 5000 [IU] via SUBCUTANEOUS
  Filled 2021-04-18 (×11): qty 1

## 2021-04-18 MED ORDER — CHLORHEXIDINE GLUCONATE CLOTH 2 % EX PADS
6.0000 | MEDICATED_PAD | Freq: Every day | CUTANEOUS | Status: DC
  Administered 2021-04-19 – 2021-04-22 (×4): 6 via TOPICAL

## 2021-04-18 MED ORDER — DEXAMETHASONE SODIUM PHOSPHATE 10 MG/ML IJ SOLN
INTRAMUSCULAR | Status: DC | PRN
  Administered 2021-04-18: 4 mg via INTRAVENOUS

## 2021-04-18 MED ORDER — HEPARIN SODIUM (PORCINE) 1000 UNIT/ML DIALYSIS
1000.0000 [IU] | INTRAMUSCULAR | Status: DC | PRN
  Filled 2021-04-18: qty 1

## 2021-04-18 MED ORDER — CLONIDINE HCL (ANALGESIA) 100 MCG/ML EP SOLN
EPIDURAL | Status: DC | PRN
  Administered 2021-04-18 (×2): 100 ug

## 2021-04-18 MED ORDER — DEXAMETHASONE SODIUM PHOSPHATE 10 MG/ML IJ SOLN
INTRAMUSCULAR | Status: AC
  Filled 2021-04-18: qty 1

## 2021-04-18 MED ORDER — LIDOCAINE 2% (20 MG/ML) 5 ML SYRINGE
INTRAMUSCULAR | Status: AC
  Filled 2021-04-18: qty 5

## 2021-04-18 MED ORDER — HYDROMORPHONE HCL 2 MG PO TABS
2.0000 mg | ORAL_TABLET | ORAL | Status: DC | PRN
  Administered 2021-04-18 – 2021-04-19 (×3): 2 mg via ORAL
  Filled 2021-04-18 (×3): qty 1

## 2021-04-18 MED ORDER — HYDROMORPHONE HCL 1 MG/ML IJ SOLN
0.5000 mg | INTRAMUSCULAR | Status: DC | PRN
  Administered 2021-04-18: 0.5 mg via INTRAVENOUS

## 2021-04-18 MED ORDER — PENTAFLUOROPROP-TETRAFLUOROETH EX AERO
1.0000 "application " | INHALATION_SPRAY | CUTANEOUS | Status: DC | PRN
  Filled 2021-04-18: qty 116

## 2021-04-18 MED ORDER — FENTANYL CITRATE (PF) 250 MCG/5ML IJ SOLN
INTRAMUSCULAR | Status: AC
  Filled 2021-04-18: qty 5

## 2021-04-18 MED ORDER — LIDOCAINE HCL (PF) 1 % IJ SOLN
5.0000 mL | INTRAMUSCULAR | Status: DC | PRN
  Filled 2021-04-18: qty 5

## 2021-04-18 MED ORDER — ORAL CARE MOUTH RINSE: 15.0000 mL | Freq: Once | OROMUCOSAL | Status: AC

## 2021-04-18 MED ORDER — ROPIVACAINE HCL 5 MG/ML IJ SOLN
INTRAMUSCULAR | Status: DC | PRN
  Administered 2021-04-18: 10 mL via PERINEURAL
  Administered 2021-04-18: 30 mL via PERINEURAL

## 2021-04-18 MED ORDER — PROPOFOL 10 MG/ML IV BOLUS
INTRAVENOUS | Status: AC
  Filled 2021-04-18: qty 20

## 2021-04-18 MED ORDER — SODIUM CHLORIDE 0.9 % IV SOLN
1.0000 g | INTRAVENOUS | Status: DC
  Administered 2021-04-18: 1 g via INTRAVENOUS
  Filled 2021-04-18 (×2): qty 1

## 2021-04-18 MED ORDER — POLYETHYLENE GLYCOL 3350 17 G PO PACK
17.0000 g | PACK | Freq: Every day | ORAL | Status: DC
  Administered 2021-04-20 – 2021-04-21 (×2): 17 g via ORAL
  Filled 2021-04-18 (×3): qty 1

## 2021-04-18 MED ORDER — VANCOMYCIN HCL 1750 MG/350ML IV SOLN
1750.0000 mg | Freq: Once | INTRAVENOUS | Status: AC
  Administered 2021-04-18: 1750 mg via INTRAVENOUS
  Filled 2021-04-18: qty 350

## 2021-04-18 MED ORDER — LIDOCAINE-PRILOCAINE 2.5-2.5 % EX CREA
1.0000 "application " | TOPICAL_CREAM | CUTANEOUS | Status: DC | PRN
  Filled 2021-04-18: qty 5

## 2021-04-18 MED ORDER — CHLORHEXIDINE GLUCONATE 0.12 % MT SOLN
OROMUCOSAL | Status: AC
  Administered 2021-04-18: 15 mL via OROMUCOSAL
  Filled 2021-04-18: qty 15

## 2021-04-18 MED ORDER — PHENYLEPHRINE 40 MCG/ML (10ML) SYRINGE FOR IV PUSH (FOR BLOOD PRESSURE SUPPORT)
PREFILLED_SYRINGE | INTRAVENOUS | Status: AC
  Filled 2021-04-18: qty 10

## 2021-04-18 MED ORDER — HYDROMORPHONE HCL 1 MG/ML IJ SOLN: 0.5000 mg | INTRAMUSCULAR | Status: DC | PRN

## 2021-04-18 MED ORDER — CEFAZOLIN SODIUM-DEXTROSE 2-3 GM-%(50ML) IV SOLR
INTRAVENOUS | Status: DC | PRN
  Administered 2021-04-18: 2 g via INTRAVENOUS

## 2021-04-18 MED ORDER — ONDANSETRON HCL 4 MG/2ML IJ SOLN
INTRAMUSCULAR | Status: AC
  Filled 2021-04-18: qty 2

## 2021-04-18 MED ORDER — VANCOMYCIN VARIABLE DOSE PER UNSTABLE RENAL FUNCTION (PHARMACIST DOSING): Status: DC

## 2021-04-18 MED ORDER — ALTEPLASE 2 MG IJ SOLR
2.0000 mg | Freq: Once | INTRAMUSCULAR | Status: DC | PRN
  Filled 2021-04-18: qty 2

## 2021-04-18 MED ORDER — PROPOFOL 10 MG/ML IV BOLUS
INTRAVENOUS | Status: DC | PRN
  Administered 2021-04-18: 100 mg via INTRAVENOUS

## 2021-04-18 SURGICAL SUPPLY — 62 items
APL PRP STRL LF DISP 70% ISPRP (MISCELLANEOUS) ×1
BAG COUNTER SPONGE SURGICOUNT (BAG) ×2 IMPLANT
BAG SPNG CNTER NS LX DISP (BAG) ×1
BLADE LONG MED 31X9 (MISCELLANEOUS) IMPLANT
BLADE SAGITTAL (BLADE)
BLADE SAGITTAL 25.0X1.19X90 (BLADE) IMPLANT
BLADE SAW GIGLI 510 (BLADE) ×1 IMPLANT
BLADE SAW THK.89X75X18XSGTL (BLADE) IMPLANT
BLADE SURG 21 STRL SS (BLADE) ×2 IMPLANT
BNDG COHESIVE 6X5 TAN STRL LF (GAUZE/BANDAGES/DRESSINGS) ×1 IMPLANT
BNDG ELASTIC 4X5.8 VLCR STR LF (GAUZE/BANDAGES/DRESSINGS) IMPLANT
BNDG ELASTIC 6X5.8 VLCR STR LF (GAUZE/BANDAGES/DRESSINGS) IMPLANT
BNDG GAUZE ELAST 4 BULKY (GAUZE/BANDAGES/DRESSINGS) IMPLANT
CANISTER PREVENA 45 (CANNISTER) IMPLANT
CANISTER SUCT 3000ML PPV (MISCELLANEOUS) ×2 IMPLANT
CANISTER WOUNDNEG PRESSURE 500 (CANNISTER) ×1 IMPLANT
CHLORAPREP W/TINT 26 (MISCELLANEOUS) ×2 IMPLANT
CLIP VESOCCLUDE MED 6/CT (CLIP) IMPLANT
COVER SURGICAL LIGHT HANDLE (MISCELLANEOUS) ×1 IMPLANT
DRAIN CHANNEL 19F RND (DRAIN) IMPLANT
DRAPE DERMATAC (DRAPES) IMPLANT
DRAPE HALF SHEET 40X57 (DRAPES) ×2 IMPLANT
DRAPE INCISE IOBAN 66X45 STRL (DRAPES) ×1 IMPLANT
DRAPE ORTHO SPLIT 77X108 STRL (DRAPES) ×4
DRAPE SURG ORHT 6 SPLT 77X108 (DRAPES) ×2 IMPLANT
DRESSING PREVENA PLUS CUSTOM (GAUZE/BANDAGES/DRESSINGS) IMPLANT
DRSG PREVENA PLUS CUSTOM (GAUZE/BANDAGES/DRESSINGS)
DRSG VAC ATS SM SENSATRAC (GAUZE/BANDAGES/DRESSINGS) ×1 IMPLANT
ELECT REM PT RETURN 9FT ADLT (ELECTROSURGICAL) ×2
ELECTRODE REM PT RTRN 9FT ADLT (ELECTROSURGICAL) ×1 IMPLANT
EVACUATOR SILICONE 100CC (DRAIN) IMPLANT
GAUZE SPONGE 4X4 12PLY STRL (GAUZE/BANDAGES/DRESSINGS) ×2 IMPLANT
GAUZE XEROFORM 5X9 LF (GAUZE/BANDAGES/DRESSINGS) ×2 IMPLANT
GLOVE SURG POLYISO LF SZ8 (GLOVE) ×2 IMPLANT
GOWN STRL REUS W/ TWL LRG LVL3 (GOWN DISPOSABLE) ×2 IMPLANT
GOWN STRL REUS W/ TWL XL LVL3 (GOWN DISPOSABLE) ×1 IMPLANT
GOWN STRL REUS W/TWL LRG LVL3 (GOWN DISPOSABLE) ×2
GOWN STRL REUS W/TWL XL LVL3 (GOWN DISPOSABLE) ×2
KIT BASIN OR (CUSTOM PROCEDURE TRAY) ×2 IMPLANT
KIT TURNOVER KIT B (KITS) ×2 IMPLANT
NS IRRIG 1000ML POUR BTL (IV SOLUTION) ×2 IMPLANT
PACK GENERAL/GYN (CUSTOM PROCEDURE TRAY) ×2 IMPLANT
PAD ARMBOARD 7.5X6 YLW CONV (MISCELLANEOUS) ×4 IMPLANT
PENCIL SMOKE EVACUATOR (MISCELLANEOUS) ×2 IMPLANT
PREVENA RESTOR ARTHOFORM 46X30 (CANNISTER) IMPLANT
STAPLER SKIN 35 REG (STAPLE) ×2 IMPLANT
STAPLER VISISTAT 35W (STAPLE) ×2 IMPLANT
STOCKINETTE IMPERVIOUS LG (DRAPES) ×2 IMPLANT
SUT BONE WAX W31G (SUTURE) IMPLANT
SUT ETHILON 2 0 PSLX (SUTURE) ×4 IMPLANT
SUT ETHILON 3 0 PS 1 (SUTURE) IMPLANT
SUT SILK 0 TIES 10X30 (SUTURE) ×2 IMPLANT
SUT SILK 2 0 (SUTURE) ×2
SUT SILK 2 0 SH CR/8 (SUTURE) ×2 IMPLANT
SUT SILK 2-0 18XBRD TIE 12 (SUTURE) ×1 IMPLANT
SUT SILK 3 0 (SUTURE)
SUT SILK 3-0 18XBRD TIE 12 (SUTURE) IMPLANT
SUT VIC AB 0 CT1 18XCR BRD 8 (SUTURE) ×2 IMPLANT
SUT VIC AB 0 CT1 8-18 (SUTURE)
TOWEL GREEN STERILE (TOWEL DISPOSABLE) ×4 IMPLANT
UNDERPAD 30X36 HEAVY ABSORB (UNDERPADS AND DIAPERS) ×2 IMPLANT
WATER STERILE IRR 1000ML POUR (IV SOLUTION) ×2 IMPLANT

## 2021-04-18 NOTE — Progress Notes (Signed)
Pharmacy Antibiotic Note  Kathy Frank is a 45 y.o. female admitted on 04/17/2021 with  Wound infection with possible osteomyelitis .  Pharmacy has been consulted for Vancomycin and Cefepime dosing. Wound with purulent drainage. Patient is diabetic and has ESRD - last HD on Tuesday. Patient missed HD on Thursday. Plan is for dialysis tomorrow. Patient went to OR today and received Ancef pre-operatively. Per op note - minimal infection  Plan: Vancomycin '1750mg'$  IV x1 then await HD plans for further dosing.  Cefepime 1g IV every 24 hours for now - dose in PM to be after HD.  When HD schedule confirmed, will adjust dosing.  Monitor any culture results and clinical status.   Height: '5\' 5"'$  (165.1 cm) Weight: 74.8 kg (165 lb) IBW/kg (Calculated) : 57  Temp (24hrs), Avg:98.1 F (36.7 C), Min:97.8 F (36.6 C), Max:98.4 F (36.9 C)  Recent Labs  Lab 04/17/21 1240 04/18/21 0216  WBC 5.7  --   CREATININE 8.05* 9.07*    Estimated Creatinine Clearance: 8 mL/min (A) (by C-G formula based on SCr of 9.07 mg/dL (H)).    No Known Allergies  Antimicrobials this admission: Cefazolin x1 9/9.  Vanc 9/9 >> Cefepime 9/9 >>  Dose adjustments this admission:   Microbiology results: pending  Thank you for allowing pharmacy to be a part of this patient's care.  Sloan Leiter, PharmD, BCPS, BCCCP Clinical Pharmacist Please refer to [facility] for [place] numbers 04/18/2021 6:22 PM

## 2021-04-18 NOTE — Op Note (Addendum)
DATE OF SERVICE: 04/18/2021  PATIENT:  Kathy Frank  45 y.o. female  PRE-OPERATIVE DIAGNOSIS: Right below-knee amputation dehiscence  POST-OPERATIVE DIAGNOSIS:  Same  PROCEDURE:   Incision and drainage of right below-knee amputation stump Application of negative pressure wound therapy system  SURGEON:  Surgeon(s) and Role:    * Cherre Robins, MD - Primary  ASSISTANT: None  ANESTHESIA:   general  EBL: min  BLOOD ADMINISTERED:none  DRAINS: none   LOCAL MEDICATIONS USED:  NONE  SPECIMEN:  none  COUNTS: confirmed correct.  TOURNIQUET:  none  PATIENT DISPOSITION:  PACU - hemodynamically stable.   Delay start of Pharmacological VTE agent (>24hrs) due to surgical blood loss or risk of bleeding: no  INDICATION FOR PROCEDURE: [name] is a 45 y.o. female with dehiscence of right below-knee amputation stump after a fall.  The wound has purulent drainage.  I counseled the patient extensively that an above-knee amputation would be best to treat this problem.  She strongly desires revision of her below-knee amputation. After careful discussion of risks, benefits, and alternatives the patient was offered incision and drainage of the stump. We specifically discussed risk of failure. The patient understood and wished to proceed.  OPERATIVE FINDINGS: Minimal infection.  Wound opened the length of the stump.  No large pocket identified.  Wound VAC applied.  DESCRIPTION OF PROCEDURE: After identification of the patient in the pre-operative holding area, the patient was transferred to the operating room. The patient was positioned supine on the operating room table. Anesthesia was induced. The right leg was prepped and draped in standard fashion. A surgical pause was performed confirming correct patient, procedure, and operative location.  The staples were removed from the right below-knee amputation stump.  The wound was explored and a subcutaneous pocket opened throughout the  length of the incision.  No deep, subfascial pockets were identified.  The wound was copiously irrigated.  A negative pressure wound VAC system was brought on the field, cut to size to the wound, and negative pressure therapy initiated.  Upon completion of the case instrument and sharps counts were confirmed correct. The patient was transferred to the PACU in good condition. I was present for all portions of the procedure.  [name]. Stanford Breed, MD [facility] Phone Number: [phone] 04/18/2021 3:10 PM

## 2021-04-18 NOTE — Transfer of Care (Signed)
Immediate Anesthesia Transfer of Care Note  Patient: Kathy Frank  Procedure(s) Performed: right below knee amputation/washout placement wound vac (Right: Knee)  Patient Location: PACU  Anesthesia Type:General  Level of Consciousness: patient cooperative and lethargic  Airway & Oxygen Therapy: Patient Spontanous Breathing and Patient connected to nasal cannula oxygen  Post-op Assessment: Report given to RN  Post vital signs: Reviewed and stable  Last Vitals:  Vitals Value Taken Time  BP 127/65 04/18/21 1458  Temp    Pulse 74 04/18/21 1458  Resp 10 04/18/21 1458  SpO2 100 % 04/18/21 1458  Vitals shown include unvalidated device data.  Last Pain:  Vitals:   04/18/21 1219  TempSrc:   PainSc: 8       Patients Stated Pain Goal: 3 ([id number])  Complications: No notable events documented.

## 2021-04-18 NOTE — Progress Notes (Signed)
[facility] [place] PROGRESS NOTE  ASSESSMENT / PLAN: Kathy OLANO is a 45 y.o. female with right below-knee amputation wound dehiscence.  There is concern for infection.  I offer the patient above-knee amputation, which I think would be the best option for her.  She strongly desires revision of her below-knee amputation.  Plan to proceed with right below-knee amputation washout and VAC placement in the operating room today.  SUBJECTIVE: No complaints.  Reviewed options with patient.  OBJECTIVE: BP (!) 169/62   Pulse 85   Temp 98.2 F (36.8 C) (Oral)   Resp 18   Ht '5\' 5"'$  (1.651 m)   Wt 74.8 kg   LMP 10/23/2020 (Approximate)   SpO2 100%   BMI 27.46 kg/m  No acute distress Regular rate and rhythm Unlabored Right below-knee amputation dehiscence  CBC Latest Ref Rng & Units 04/17/2021 04/02/2021 04/01/2021  WBC 4.0 - 10.5 K/uL 5.7 8.5 9.6  Hemoglobin 12.0 - 15.0 g/dL 10.0(L) 8.0(L) 5.7(LL)  Hematocrit 36.0 - 46.0 % 32.0(L) 23.9(L) 18.4(L)  Platelets 150 - 400 K/uL 439(H) 253 295     CMP Latest Ref Rng & Units 04/18/2021 04/17/2021 04/01/2021  Glucose 70 - 99 mg/dL 190(H) 213(H) 124(H)  BUN 6 - 20 mg/dL 39(H) 28(H) 68(H)  Creatinine 0.44 - 1.00 mg/dL 9.07(H) 8.05(H) 10.01(H)  Sodium 135 - 145 mmol/L 135 134(L) 127(L)  Potassium 3.5 - 5.1 mmol/L 4.4 4.1 6.4(HH)  Chloride 98 - 111 mmol/L 95(L) 93(L) 91(L)  CO2 22 - 32 mmol/L '27 25 23  '$ Calcium 8.9 - 10.3 mg/dL 8.1(L) 8.5(L) 9.5  Total Protein 6.5 - 8.1 g/dL - - -  Total Bilirubin 0.3 - 1.2 mg/dL - - -  Alkaline Phos 38 - 126 U/L - - -  AST 15 - 41 U/L - - -  ALT 0 - 44 U/L - - -    Estimated Creatinine Clearance: 8 mL/min (A) (by C-G formula based on SCr of 9.07 mg/dL (H)).  Kathy Aline. Stanford Breed, MD [facility] Phone Number: [phone] 04/18/2021 12:50 PM

## 2021-04-18 NOTE — Hospital Course (Signed)
Fell and hi  No chest pain, cough, burning with urination.  HTN, HLD, T2DM,   Doesn't recall home medicaiotns.   Lantus, amlodipine,   No MI  ESRD TTHSat, no HD since Tuesday.    SH: Lives in [place] with daughter.  On disability No drinking  No smoking Former alcohol use and cigarette use. Currently vapes intermittently. Prior cocaine use.

## 2021-04-18 NOTE — Anesthesia Preprocedure Evaluation (Signed)
Anesthesia Evaluation  Patient identified by MRN, date of birth, ID band Patient awake    Reviewed: Allergy & Precautions, NPO status , Patient's Chart, lab work & pertinent test results  Airway Mallampati: II  TM Distance: >3 FB Neck ROM: Full    Dental no notable dental hx. (+) Missing, Dental Advisory Given,    Pulmonary former smoker,    Pulmonary exam normal breath sounds clear to auscultation       Cardiovascular hypertension, Normal cardiovascular exam Rhythm:Regular Rate:Normal     Neuro/Psych    GI/Hepatic   Endo/Other  diabetes  Renal/GU      Musculoskeletal   Abdominal   Peds  Hematology   Anesthesia Other Findings   Reproductive/Obstetrics                             Anesthesia Physical Anesthesia Plan  ASA: 4  Anesthesia Plan: General   Post-op Pain Management:  Regional for Post-op pain   Induction:   PONV Risk Score and Plan: 4 or greater and Treatment may vary due to age or medical condition and Ondansetron  Airway Management Planned: LMA  Additional Equipment: None  Intra-op Plan:   Post-operative Plan:   Informed Consent:     Dental advisory given  Plan Discussed with:   Anesthesia Plan Comments:         Anesthesia Quick Evaluation

## 2021-04-18 NOTE — Anesthesia Postprocedure Evaluation (Signed)
Anesthesia Post Note  Patient: Kathy Frank  Procedure(s) Performed: right below knee amputation/washout placement wound vac (Right: Knee)     Patient location during evaluation: PACU Anesthesia Type: General Level of consciousness: awake and alert Pain management: pain level controlled Vital Signs Assessment: post-procedure vital signs reviewed and stable Respiratory status: spontaneous breathing, nonlabored ventilation, respiratory function stable and patient connected to nasal cannula oxygen Cardiovascular status: blood pressure returned to baseline and stable Postop Assessment: no apparent nausea or vomiting Anesthetic complications: no   No notable events documented.  Last Vitals:  Vitals:   04/18/21 1328 04/18/21 1500  BP:  127/65  Pulse: 82 75  Resp:  11  Temp:  36.6 C  SpO2: 100% 100%    Last Pain:  Vitals:   04/18/21 1500  TempSrc:   PainSc: [name]

## 2021-04-18 NOTE — H&P (Signed)
Date: 04/18/2021               Patient Name:  Kathy Frank MRN: [id number]  DOB: [birth year] Age / Sex: 45 y.o., female   PCP: [name], MD         Medical Service: Internal Medicine Teaching Service         Attending Physician: Dr. Lucious Groves, DO    First Contact: Dr. Vinetta Bergamo Pager: [phone]  Second Contact: Dr. 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 Trula Slade) due to infection, who presented to [facility] ED on 9/8 for bleeding and wound dehiscence at the R LE amputation site after a fall at home.   Patient seen in PACU recovering after R BKA revision/I&D with irrigation. Patient reports she was transferring out of bed at home on 9/8 at 8am when she fell on R LE amputation site. Patient examined incision site and noticed the wound had opened and there was bleeding. She denied purulent drainage, swelling and erythema, fevers, nausea, vomiting, abdominal pain, chest pain, cough, SOB, dysuria. Prior to fall, she had been cleaning the surgical wound daily and had not noted any drainage or recurrent odor as she did with previous infection. She took home dose percocet and came to the ED for evaluation. Photos of incision site 9/8 in media tab.  Last HD session was Tuesday. She fell prior to session on Thursday. She gets HD on TThSa.  In the ED, patient was afebrile and hypertensive to 200/91 otherwise HDS without signs of systemic infection. DG R knee showed small amount of gas in the affected limb adjacent to remaining distal tibia, concerning for soft tissue  infection and mild periosteal reaction along tibia concerning for possible OM. Vascular surgery was consulted and patient was admitted for revision of BKA. Vascular recommended AKA though patient opted for BKA revision.   On 9/9 patient had R BKA revision/washout (Dr. Stanford Breed) with wound vac placed. Patient recovering in the PACU. She denies pain, nausea vomiting, chest pain, is Aox3. She is on 2L [place] and denies SOB, is not on oxygen at home but states she requests oxygen at HD sessions because it is calming for her.   Patient not started on antibiotics in the ED. She was given one dose of ancef intra op. Vascular notes they would like patient to be started on broad spectrum antibiotics though no antibiotics have been ordered at this time.    Meds:  Current Meds  Medication Sig   albuterol (VENTOLIN HFA) 108 (90 Base) MCG/ACT inhaler Inhale 2 puffs into the lungs every 4 (four) hours as needed for wheezing or shortness of breath.   amLODipine (NORVASC) 5 MG tablet Take 1 tablet (5 mg total) by mouth daily.   aspirin EC 81 MG tablet Take 1 tablet (81 mg total) by mouth daily. Swallow whole.   atorvastatin (LIPITOR) 40 MG tablet Take 1 tablet (40 mg total) by mouth daily.   bictegravir-emtricitabine-tenofovir AF (BIKTARVY) 50-200-25 MG TABS tablet TAKE 1 TABLET BY MOUTH DAILY.   calcitRIOL (ROCALTROL) 0.25  MCG capsule Take 1 capsule (0.25 mcg total) by mouth Every Tuesday,Thursday,and Saturday with dialysis.   calcium acetate (PHOSLO) 667 MG capsule Take 3 capsules (2,001 mg total) by mouth 3 (three) times daily with meals.   clopidogrel (PLAVIX) 75 MG tablet Take 1 tablet (75 mg total) by mouth daily.   gabapentin (NEURONTIN) 100 MG capsule Take 1 capsule (100 mg total) by mouth 3 (three) times a week. (Patient taking differently: Take 100-300 mg by mouth See admin instructions. Take 100 mg on Monday, Wednesday, Friday and Sunday)   insulin glargine (LANTUS SOLOSTAR) 100 UNIT/ML Solostar Pen Inject 8  Units into the skin at bedtime. (Patient taking differently: Inject 10 Units into the skin at bedtime.)   multivitamin (RENA-VIT) TABS tablet Take 1 tablet by mouth daily.   oxyCODONE-acetaminophen (PERCOCET/ROXICET) 5-325 MG tablet Take 1 tablet by mouth every 6 (six) hours as needed.   pantoprazole (PROTONIX) 40 MG tablet Take 1 tablet (40 mg total) by mouth daily.   polyethylene glycol (MIRALAX / GLYCOLAX) 17 g packet Take 17 g by mouth 2 (two) times daily. (Patient taking differently: Take 17 g by mouth daily as needed for mild constipation or moderate constipation.)   sertraline (ZOLOFT) 50 MG tablet Take 1 tablet (50 mg total) by mouth at bedtime.     Allergies: Allergies as of 04/17/2021   (No Known Allergies)   Past Medical History:  Diagnosis Date   Anal abscess    chronic   Anxiety    Depression 06/28/2006   Qualifier: Diagnosis of  By: Riccardo Dubin MD, Todd     Diabetes mellitus type 2 in obese ([place]) 06/28/1994   Dyspnea    uses oxygeb 2 liters per minute at dialysis   End stage renal disease on dialysis (HCC)    Erosive esophagitis    Esophageal reflux    Eye redness    Gastroparesis    ? diabetic   GERD (gastroesophageal reflux disease)    History of blood transfusion 2019   Human immunodeficiency virus (HIV) disease ([place]) 04/23/2016   Hypertension    Metabolic bone disease [id number]   Moderate nonproliferative diabetic retinopathy of both eyes ([place]) 11/21/2014   11/14/14: Noted on retinal imaging; needs follow-up imaging in 6 months  05/22/16: Noted on retinal imaging again; needs follow-up imaging in 6 months   Type 2 diabetes mellitus with diabetic peripheral angiopathy without gangrene ([place]) 05/01/2019   Wears dentures    lower   Wound infection s/p L transmetatarsal amputation     Family History:  Family History  Problem Relation Age of Onset   Diabetes Mother    Diabetes Brother    Diabetes Daughter    Diabetes Daughter    Mental retardation Brother         died from PNA   Diabetes Maternal Grandmother     Social History:  Lives in [place] with daughter.  On disability, previously a TEFL teacher ADLs, has help with iADLs Daughter picks up medications, has no difficulties getting meds Does not regularly see PCP though has seen [place] in [facility] Not drinking alcohol currently, previous intermittent alcohol use Currently vapes intermittently.  Remote cocaine use though never used regularly   Review of Systems: A complete ROS was negative except as per HPI.   Physical Exam: Blood pressure (!) 157/84, pulse 80, temperature 98 F (36.7 C), resp. rate 15, height '5\' 5"'$  (1.651 m), weight 74.8 kg, last menstrual period 10/23/2020, SpO2 97 %. Physical Exam: General: Well  appearing african [place] female, NAD HENT: normocephalic, atraumatic EYES: conjunctiva non-erythematous, no scleral icterus CV: regular rate, normal rhythm, no murmurs, rubs, gallops. Pulmonary: sating well on RA, lung clear to auscultation, no rales, wheezes, rhonchi Abdominal: non-distended, soft, non-tender to palpation, normal BS Skin: Warm and dry, no rashes or lesions Neurological: MS: awake, alert and oriented x3, normal speech and fund of knowledge Motor: moves all extremities antigravity Psych: normal affect  CBC Latest Ref Rng & Units 04/17/2021 04/02/2021 04/01/2021  WBC 4.0 - 10.5 K/uL 5.7 8.5 9.6  Hemoglobin 12.0 - 15.0 g/dL 10.0(L) 8.0(L) 5.7(LL)  Hematocrit 36.0 - 46.0 % 32.0(L) 23.9(L) 18.4(L)  Platelets 150 - 400 K/uL 439(H) 253 295   BMP Latest Ref Rng & Units 04/18/2021 04/17/2021 04/01/2021  Glucose 70 - 99 mg/dL 190(H) 213(H) 124(H)  BUN 6 - 20 mg/dL 39(H) 28(H) 68(H)  Creatinine 0.44 - 1.00 mg/dL 9.07(H) 8.05(H) 10.01(H)  BUN/Creat Ratio 6 - 22 (calc) - - -  Sodium 135 - 145 mmol/L 135 134(L) 127(L)  Potassium 3.5 - 5.1 mmol/L 4.4 4.1 6.4(HH)  Chloride 98 - 111 mmol/L 95(L) 93(L) 91(L)  CO2 22 - 32 mmol/L '27 25 23  '$ Calcium 8.9 - 10.3 mg/dL 8.1(L)  8.5(L) 9.5   Hemoglobin A1c [id number] (Abnormal) Collected: 04/18/21 0216  Specimen: Blood Updated: 04/18/21 0310   Hgb A1c MFr Bld 7.1 High  %    Mean Plasma Glucose 157.07 mg/dL     EKG: personally reviewed my interpretation is NSR, early repolarizations  DG Knee R:  Small amount of gas in the stump adjacent to remaining distal tibia, raising the question of soft tissue infection. Calcification forming in the soft tissues in the area may relate to heterotopic ossification. Mild periosteal reaction along the distal tibia medial aspect could reflect osteomyelitis. Electronically Signed   By: Zetta Bills M.D.   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 Trula Slade) due to infection, who presented to [facility] ED on 9/8 for bleeding and wound dehiscence at the R LE amputation site after a fall at home and was admitted for R BKA revision and washout for surgical wound infection.   R BKA Wound Dehiscence s/p revision and washout Surgical Wound Infection Concern for osteomyelitis PVD Patient POD 0 from R BKA revision/washout (Dr. Stanford Breed). Wound vac in place. Patient denying pain at this time. No antibiotics have been started.  -consult for vancomycin and cefepime per pharmacy -consider MRI R Knee to evaluate for OM -Dilaudid '2mg'$  PO q4hr PRN for mod/sev pain -Dilaudid 0.'5mg'$  IV q3hr PRN for breakthrough pain  -Zofran PRN -bowel regimen: miralax scheduled, dulcolax supp Prn - negative pressure wound therapy protocol - wound care consult -continue home aspirin '81mg'$ , plavix '75mg'$  daily  ESRD on HD TThSa Patient has ESRD secondary to HTN and DM, cannot exclude HIV  associated nephropathy. Missed HD on 9/8 due to ED visit. Nephrology consulted for inpatient admission. -IP HD per nephrology -heparin per neprhology -phoslo, rocaltrol, renal vitamin restarted  Insulin Dependent T2DM  Diabetic peripheral angiopathy Diabetic retinopathy  Gastroparesis Patient on 10u Lantus qHS at home. Patient to continue on 10u LA, gabapentin '100mg'$  TID, miralax scheduled. On SSI.  HTN  HLD Patient hypertensive too A999333 systolic on arrival in the setting of acute wound infection and pain. Trending towards normotensive. -Continued on home amlodipine '5mg'$  daily, and lipitor '40mg'$  daily  HIV Patient continued on Biktarvy 50-200 daily  Depression Patient continued on Zoloft '50mg'$  qhs  GERD Patient continued on Protonix '40mg'$  daily  Diet: renal carb modified VTE: heparin per nephrology IVF: none Code: Full   Dispo: Admit patient to Inpatient with expected length of stay greater than 2 midnights.  Signed: Wayland Denis, MD 04/18/2021, 5:42 PM  PagerMB:[phone] After 5pm on weekdays and 1pm on weekends: On Call pager: [phone]

## 2021-04-18 NOTE — Anesthesia Procedure Notes (Signed)
Anesthesia Regional Block: Adductor canal block   Pre-Anesthetic Checklist: , timeout performed,  Correct Patient, Correct Site, Correct Laterality,  Correct Procedure, Correct Position, site marked,  Risks and benefits discussed,  Surgical consent,  Pre-op evaluation,  At surgeon's request and post-op pain management  Laterality: Lower and Right  Prep: chloraprep       Needles:  Injection technique: Single-shot  Needle Type: Echogenic Needle     Needle Length: 9cm  Needle Gauge: 22     Additional Needles:   Procedures:,,,, ultrasound used (permanent image in chart),,    Narrative:  Start time: 04/18/2021 1:16 PM End time: 04/18/2021 1:20 PM Injection made incrementally with aspirations every 5 mL.  Performed by: Personally  Anesthesiologist: Barnet Glasgow, MD  Additional Notes: Block assessed prior to surgery. Pt tolerated procedure well.

## 2021-04-18 NOTE — Progress Notes (Signed)
Patient to room 4E10 from PACU. Vital signs obtained. On monitor CCMD notified. CHG bath completed. Alert and oriented to room and call light. Call bell within reach.  Era Bumpers, RN

## 2021-04-18 NOTE — Progress Notes (Signed)
Kathy Frank is a 45yo female admitted s/p fall with dehisced incision. Patient currently undergoing BKA washout/debridement via VVS. Plan for HD 9/10 and will formally consult during this time.  HD Orders: [facility]-TTS 4hrs BFR 400 DFR Auto1.5 EDW 68.5kg 2K, 2Ca L AVF  Tobie Poet, NP [employer]

## 2021-04-18 NOTE — Anesthesia Procedure Notes (Signed)
Procedure Name: LMA Insertion Date/Time: 04/18/2021 1:41 PM Performed by: Barrington Ellison, CRNA Pre-anesthesia Checklist: Patient identified, Emergency Drugs available, Suction available and Patient being monitored Patient Re-evaluated:Patient Re-evaluated prior to induction Oxygen Delivery Method: Circle System Utilized Preoxygenation: Pre-oxygenation with 100% oxygen Induction Type: IV induction Ventilation: Mask ventilation without difficulty LMA: LMA inserted LMA Size: 4.0 Number of attempts: 1 Placement Confirmation: positive ETCO2 Tube secured with: Tape Dental Injury: Teeth and Oropharynx as per pre-operative assessment

## 2021-04-18 NOTE — Anesthesia Procedure Notes (Addendum)
Anesthesia Regional Block: Popliteal block   Pre-Anesthetic Checklist: , timeout performed,  Correct Patient, Correct Site, Correct Laterality,  Correct Procedure, Correct Position, site marked,  Risks and benefits discussed,  Pre-op evaluation,  At surgeon's request and post-op pain management  Laterality: Lower and Right  Prep: Maximum Sterile Barrier Precautions used, chloraprep       Needles:  Injection technique: Single-shot  Needle Type: Echogenic Needle     Needle Length: 9cm  Needle Gauge: 21     Additional Needles:   Procedures:,,,, ultrasound used (permanent image in chart),,    Narrative:  Start time: 04/18/2021 1:10 PM End time: 04/18/2021 1:15 PM Injection made incrementally with aspirations every 5 mL.  Performed by: Personally  Anesthesiologist: Barnet Glasgow, MD  Additional Notes: Block assessed. Patient tolerated procedure well.

## 2021-04-18 NOTE — ED Notes (Signed)
2200 CBG was collected not by this RN

## 2021-04-19 DIAGNOSIS — Z992 Dependence on renal dialysis: Secondary | ICD-10-CM | POA: Diagnosis not present

## 2021-04-19 DIAGNOSIS — E119 Type 2 diabetes mellitus without complications: Secondary | ICD-10-CM | POA: Diagnosis not present

## 2021-04-19 DIAGNOSIS — N186 End stage renal disease: Secondary | ICD-10-CM | POA: Diagnosis not present

## 2021-04-19 LAB — CBC
HCT: 28.4 % — ABNORMAL LOW (ref 36.0–46.0)
Hemoglobin: 9.1 g/dL — ABNORMAL LOW (ref 12.0–15.0)
MCH: 30.1 pg (ref 26.0–34.0)
MCHC: 32 g/dL (ref 30.0–36.0)
MCV: 94 fL (ref 80.0–100.0)
Platelets: 371 10*3/uL (ref 150–400)
RBC: 3.02 MIL/uL — ABNORMAL LOW (ref 3.87–5.11)
RDW: 17 % — ABNORMAL HIGH (ref 11.5–15.5)
WBC: 9.2 10*3/uL (ref 4.0–10.5)
nRBC: 0 % (ref 0.0–0.2)

## 2021-04-19 LAB — BASIC METABOLIC PANEL
Anion gap: 19 — ABNORMAL HIGH (ref 5–15)
Anion gap: 20 — ABNORMAL HIGH (ref 5–15)
BUN: 52 mg/dL — ABNORMAL HIGH (ref 6–20)
BUN: 56 mg/dL — ABNORMAL HIGH (ref 6–20)
CO2: 21 mmol/L — ABNORMAL LOW (ref 22–32)
CO2: 18 mmol/L — ABNORMAL LOW (ref 22–32)
Calcium: 8 mg/dL — ABNORMAL LOW (ref 8.9–10.3)
Calcium: 8 mg/dL — ABNORMAL LOW (ref 8.9–10.3)
Chloride: 94 mmol/L — ABNORMAL LOW (ref 98–111)
Chloride: 97 mmol/L — ABNORMAL LOW (ref 98–111)
Creatinine, Ser: 10.38 mg/dL — ABNORMAL HIGH (ref 0.44–1.00)
Creatinine, Ser: 10.68 mg/dL — ABNORMAL HIGH (ref 0.44–1.00)
GFR, Estimated: 4 mL/min — ABNORMAL LOW (ref >60)
GFR, Estimated: 4 mL/min — ABNORMAL LOW (ref >60)
Glucose, Bld: 257 mg/dL — ABNORMAL HIGH (ref 70–99)
Glucose, Bld: 166 mg/dL — ABNORMAL HIGH (ref 70–99)
Potassium: 5.8 mmol/L — ABNORMAL HIGH (ref 3.5–5.1)
Potassium: 5.7 mmol/L — ABNORMAL HIGH (ref 3.5–5.1)
Sodium: 134 mmol/L — ABNORMAL LOW (ref 135–145)
Sodium: 135 mmol/L (ref 135–145)

## 2021-04-19 LAB — PHOSPHORUS: Phosphorus: 8.5 mg/dL — ABNORMAL HIGH (ref 2.5–4.6)

## 2021-04-19 LAB — GLUCOSE, CAPILLARY
Glucose-Capillary: 163 mg/dL — ABNORMAL HIGH (ref 70–99)
Glucose-Capillary: 118 mg/dL — ABNORMAL HIGH (ref 70–99)
Glucose-Capillary: 173 mg/dL — ABNORMAL HIGH (ref 70–99)
Glucose-Capillary: 142 mg/dL — ABNORMAL HIGH (ref 70–99)

## 2021-04-19 MED ORDER — DARBEPOETIN ALFA 60 MCG/0.3ML IJ SOSY
60.0000 ug | PREFILLED_SYRINGE | INTRAMUSCULAR | Status: DC
  Administered 2021-04-22: 60 ug via INTRAVENOUS
  Filled 2021-04-19 (×2): qty 0.3

## 2021-04-19 MED ORDER — INFLUENZA VAC SPLIT QUAD 0.5 ML IM SUSY
0.5000 mL | PREFILLED_SYRINGE | Freq: Once | INTRAMUSCULAR | Status: AC
  Administered 2021-04-22: 0.5 mL via INTRAMUSCULAR
  Filled 2021-04-19 (×2): qty 0.5

## 2021-04-19 MED ORDER — VANCOMYCIN HCL 750 MG/150ML IV SOLN: 750.0000 mg | INTRAVENOUS | Status: DC

## 2021-04-19 MED ORDER — HYDROMORPHONE HCL 1 MG/ML IJ SOLN
0.5000 mg | Freq: Once | INTRAMUSCULAR | Status: AC
Start: 2021-04-19 — End: 2021-04-19
  Administered 2021-04-19: 0.5 mg via INTRAVENOUS
  Filled 2021-04-19: qty 1

## 2021-04-19 MED ORDER — GABAPENTIN 100 MG PO CAPS
100.0000 mg | ORAL_CAPSULE | ORAL | Status: DC
  Administered 2021-04-20 – 2021-04-21 (×2): 100 mg via ORAL
  Filled 2021-04-19 (×2): qty 1

## 2021-04-19 NOTE — Progress Notes (Signed)
Subjective: No acute events or concerns overnight. Patient feels well this morning. Feels her pain is inadequately controlled on PO med, was informed that she has additionally IV med available for breakthrough pain. Has not had a BM yet post surgery. Is eating without difficulties. Denies abdominal pain, fevers, chest pain.   Objective:  Vital signs in last 24 hours: Vitals:   04/19/21 1100 04/19/21 1130 04/19/21 1157 04/19/21 1205  BP: 135/71 109/82 126/67 136/67  Pulse: 80 81 82 81  Resp: '13 11 14   '$ Temp:   98.8 F (37.1 C)   TempSrc:   Oral   SpO2: 98% 97% 100%   Weight:   70 kg   Height:       Physical Exam: General: Well appearing african [place] female, NAD HENT: normocephalic, atraumatic EYES: conjunctiva non-erythematous, no scleral icterus CV: regular rate, normal rhythm, no murmurs, rubs, gallops. Pulmonary: sating well on RA, lung clear to auscultation, no rales, wheezes, rhonchi Abdominal: non-distended, soft, non-tender to palpation, normal BS Skin: Warm and dry, no rashes or lesions MSK: bilateral leg amputations, R BKA wound vac in place with minimal serosanguinous fluid draining Neurological: MS: awake, alert and oriented x3, normal speech and fund of knowledge Motor: moves all extremities antigravity Psych: normal affect   Assessment/Plan:  Active Problems:   Infected surgical wound   BKA stump complication ([place])  Kathy Frank is a 45 yo african [place] female with PMHx of HIV, HTN, HLD, ESRD on HD, Insulin dependent T2DM, peripheral vascular disease, bilateral lower extremity amputations, most recently 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 Trula Slade) due to infection, who presented to [facility] ED on 9/8 for bleeding and wound dehiscence at the R LE amputation site after a fall at home and was admitted for R BKA revision and washout for wound dehiscence and surgical wound infection.  R BKA Wound Dehiscence s/p revision and washout Possible  Surgical Wound Infection Concern for osteomyelitis PVD Patient POD 1 from R BKA revision/washout (Dr. Stanford Breed 04/18/21). Per op note, there was minimal infection, no large pocket. Discussed case with Dr. Stanford Breed this afternoon and he feels that it is reasonable to discontinue antibiotics at this time. Periosteal reaction on xray could be secondary to recent revision on 8/19 rather than OM. Pain adequately managed. Wound vac draining serosanguinous fluid.  -D/c  vanc and cefepime 9/10 -Dilaudid '2mg'$  PO q4hr PRN for mod/sev pain -Dilaudid 0.'5mg'$  IV q3hr PRN for breakthrough pain  -Zofran PRN -bowel regimen: miralax scheduled, dulcolax supp PRN - negative pressure wound therapy nursing order -Wound care consult, will need wound vac supplies -TOC consult, may need HHRN for help with wound vac -continue home aspirin '81mg'$ , plavix '75mg'$  daily  ESRD on HD TThSa Patient has ESRD secondary to HTN and DM, cannot exclude HIV associated nephropathy. Missed HD on 9/8 due to ED visit. Nephrology consulted for inpatient admission. -IP HD per nephrology, last HD 9/10 -heparin per neprhology -phoslo, rocaltrol, renal vitamin restarted  Insulin Dependent T2DM  Diabetic peripheral angiopathy Diabetic retinopathy  Gastroparesis Patient on 10u Lantus qHS at home. Patient to continue on 10u LA, gabapentin '100mg'$  TID.  -SSI, monitor CBGs  HTN HLD Patient hypertensive too A999333 systolic on arrival in the setting of acute wound infection and pain. Trending towards normotensive. -Continued on home amlodipine '5mg'$  daily, and lipitor '40mg'$  daily  HIV Patient continued on Biktarvy 50-200 daily  Depression Patient continued on Zoloft '50mg'$  qhs   GERD Patient continued on Protonix '40mg'$  daily  Diet: renal carb modified VTE: heparin per nephrology IVF: none Code: Full  Prior to Admission Living Arrangement: Home Anticipated Discharge Location: Home Barriers to Discharge: pending clinical improvement Dispo:  Anticipated discharge in approximately 1-2 day(s).   Wayland Denis, MD 04/19/2021, 1:14 PM Pager: [phone] After 5pm on weekdays and 1pm on weekends: On Call pager [phone]

## 2021-04-19 NOTE — Plan of Care (Signed)
?  Problem: Education: ?Goal: Knowledge of General Education information will improve ?Description: Including pain rating scale, medication(s)/side effects and non-pharmacologic comfort measures ?Outcome: Progressing ?  ?Problem: Clinical Measurements: ?Goal: Respiratory complications will improve ?Outcome: Progressing ?  ?Problem: Activity: ?Goal: Risk for activity intolerance will decrease ?Outcome: Progressing ?  ?Problem: Nutrition: ?Goal: Adequate nutrition will be maintained ?Outcome: Progressing ?  ?Problem: Coping: ?Goal: Level of anxiety will decrease ?Outcome: Progressing ?  ?Problem: Pain Managment: ?Goal: General experience of comfort will improve ?Outcome: Progressing ?  ?

## 2021-04-19 NOTE — Consult Note (Signed)
[facility] Nurse Consult Note: Reason for Consult: Consult received for NPWT dressing changes on M/W/F.  V.A.C, placed by Vascular surgery (Dr. Stanford Breed) on 04/18/21. Wound type: Surgical complication Pressure Injury POA: N/A  WOC nurse to change VAC dressings on M/W/F. Next dressing change is scheduled for Monday, 04/21/21  [place] nursing team will not follow, but will remain available to this patient, the nursing and medical teams.  Please re-consult if needed. Thanks, Maudie Flakes, MSN, RN, [place], Arther Abbott  Pager# [phone]

## 2021-04-19 NOTE — Consult Note (Signed)
[facility] Renal Consultation Note    Indication for Consultation:  Management of ESRD/hemodialysis; anemia, hypertension/volume and secondary hyperparathyroidism  [id number], [name], MD  HPI: Kathy Frank is a 45 y.o. female with ESRD on HD TTS at [facility].  Past medical history significant for  DM, HTN, PAD s/p L AKA and R BKA, gastroparesis, diabetic retinopathy and erosive esophagitis.   Recent admission 8/19-8/24 for revision of R BKA due to non healing incision with ischemic changes.   Patient seen and examined at bedside in dialysis.  Reports when at home 2 days ago, she was laying in bed and felt like she needed to walk so she attempted to get out of bed and fell on the floor directly on her stump.  States she is unsure what made her have that feeling, it has never happened before.  Denies confusion, sleeping or just waking from sleep.  States she changed her dressing on the ground and her daughter noticed the incision had "split open" so she called EMS.  In the ED noted to have wound dehiscence with purulence.  Additional pertinent findings in work up include K 5.8, SCr 10.68, BUN 56, and x ray of left knee with small amount of gas in the stump adjacent to remaining distal tibia concerning for possible soft tissue infection. Went to the OR yesterday with Dr. Stanford Breed for revision of R BKA and wound vac placed.    Currently in HD tolerating well.  Admits to pain in R stump and nausea.  Denies abdominal pain, vomiting, weakness, dizziness, fatigue, CP, SOB, and diarrhea.   Past Medical History:  Diagnosis Date   Anal abscess    chronic   Anxiety    Depression 06/28/2006   Qualifier: Diagnosis of  By: Riccardo Dubin MD, Todd     Diabetes mellitus type 2 in obese ([place]) 06/28/1994   Dyspnea    uses oxygeb 2 liters per minute at dialysis   End stage renal disease on dialysis [facility])    Erosive esophagitis    Esophageal reflux    Eye redness    Gastroparesis    ?  diabetic   GERD (gastroesophageal reflux disease)    History of blood transfusion 2019   Human immunodeficiency virus (HIV) disease ([place]) 04/23/2016   Hypertension    Metabolic bone disease [id number]   Moderate nonproliferative diabetic retinopathy of both eyes ([facility]) 11/21/2014   11/14/14: Noted on retinal imaging; needs follow-up imaging in 6 months  05/22/16: Noted on retinal imaging again; needs follow-up imaging in 6 months   Type 2 diabetes mellitus with diabetic peripheral angiopathy without gangrene ([place]) 05/01/2019   Wears dentures    lower   Wound infection s/p L transmetatarsal amputation    Past Surgical History:  Procedure Laterality Date   ABDOMINAL AORTOGRAM W/LOWER EXTREMITY N/A 12/13/2020   Procedure: ABDOMINAL AORTOGRAM W/LOWER EXTREMITY;  Surgeon: Angelia Mould, MD;  Location: [place] [facility];  Service: Cardiovascular;  Laterality: N/A;   AMPUTATION Left 07/08/2018   Procedure: AMPUTATION FORTH RAY LEFT FOOT;  Surgeon: [name], MD;  Location: [place];  Service: Orthopedics;  Laterality: Left;   AMPUTATION Left 08/09/2018   Procedure: Left Transmetatarsal Amputation;  Surgeon: [name], MD;  Location: [place];  Service: Orthopedics;  Laterality: Left;   AMPUTATION Left 10/08/2018   Procedure: LEFT BELOW KNEE AMPUTATION;  Surgeon: [name], MD;  Location: [place];  Service: Orthopedics;  Laterality: Left;   AMPUTATION Left 10/28/2018  Procedure: REVISION BELOW KNEE AMPUTATION;  Surgeon: [name], MD;  Location: [place];  Service: Orthopedics;  Laterality: Left;   AMPUTATION Left 12/16/2018   Procedure: LEFT ABOVE KNEE AMPUTATION;  Surgeon: [name], MD;  Location: [place];  Service: Orthopedics;  Laterality: Left;   AMPUTATION Right 01/31/2021   Procedure: AMPUTATION BELOW KNEE;  Surgeon: Serafina Mitchell, MD;  Location: [facility] OR;  Service: Vascular;  Laterality: Right;   AMPUTATION Right 03/28/2021   Procedure: REVISION OF RIGHT BELOW KNEE  AMPUTATION;  Surgeon: Serafina Mitchell, MD;  Location: [place];  Service: Vascular;  Laterality: Right;   AV FISTULA PLACEMENT Left 10/14/2018   Procedure: Arteriovenous (Av) Fistula Creation Left Arm;  Surgeon: [name], MD;  Location: [place];  Service: Vascular;  Laterality: Left;   Ty Ty Left 04/14/2019   Procedure: BASILIC VEIN TRANSPOSITION SECOND STAGE LEFT ARM;  Surgeon: Rosetta Posner, MD;  Location: [place];  Service: Vascular;  Laterality: Left;   INCISION AND DRAINAGE ABSCESS N/A 10/24/2020   Procedure: exicision of hydradenitis;  Surgeon: Leighton Ruff, MD;  Location: [place];  Service: General;  Laterality: N/A;  45 min   INSERTION OF DIALYSIS CATHETER Right 04/14/2019   Procedure: INSERTION OF DIALYSIS CATHETER;  Surgeon: Rosetta Posner, MD;  Location: [place];  Service: Vascular;  Laterality: Right;   LOWER EXTREMITY ANGIOGRAPHY N/A 07/05/2018   Procedure: LOWER EXTREMITY ANGIOGRAPHY;  Surgeon: Serafina Mitchell, MD;  Location: [place] [facility];  Service: Cardiovascular;  Laterality: N/A;   PERIPHERAL VASCULAR ATHERECTOMY Right 12/13/2020   Procedure: PERIPHERAL VASCULAR ATHERECTOMY;  Surgeon: Angelia Mould, MD;  Location: [place] [facility];  Service: Cardiovascular;  Laterality: Right;  Superficial femoral   PERIPHERAL VASCULAR BALLOON ANGIOPLASTY Left 07/05/2018   Procedure: PERIPHERAL VASCULAR BALLOON ANGIOPLASTY;  Surgeon: Serafina Mitchell, MD;  Location: [place] [facility];  Service: Cardiovascular;  Laterality: Left;  SFA   PERIPHERAL VASCULAR BALLOON ANGIOPLASTY Right 12/13/2020   Procedure: PERIPHERAL VASCULAR BALLOON ANGIOPLASTY;  Surgeon: Angelia Mould, MD;  Location: [place] [facility];  Service: Cardiovascular;  Laterality: Right;  Peroneal artery, anterior tibial artery   RECTAL EXAM UNDER ANESTHESIA N/A 10/24/2020   Procedure: EXAM UNDER ANESTHESIA;  Surgeon: Leighton Ruff, MD;  Location: [facility];   Service: General;  Laterality: N/A;   STUMP REVISION Left 10/19/2018   Procedure: REVISION LEFT BELOW KNEE AMPUTATION;  Surgeon: [name], MD;  Location: [place];  Service: Orthopedics;  Laterality: Left;   TUBAL LIGATION  2002   Family History  Problem Relation Age of Onset   Diabetes Mother    Diabetes Brother    Diabetes Daughter    Diabetes Daughter    Mental retardation Brother        died from PNA   Diabetes Maternal Grandmother    Social History:  reports that she quit smoking about 6 years ago. Her smoking use included cigarettes. She started smoking about 7 years ago. She has a 1.00 pack-year smoking history. She has never used smokeless tobacco. She reports that she does not currently use alcohol. She reports that she does not currently use drugs after having used the following drugs: Marijuana. No Known Allergies Prior to Admission medications   Medication Sig Start Date End Date Taking? Authorizing Provider  albuterol (VENTOLIN HFA) 108 (90 Base) MCG/ACT inhaler Inhale 2 puffs into the lungs every 4 (four) hours as needed for wheezing  or shortness of breath. 02/14/21  Yes Angiulli, Lavon Paganini, PA-C  amLODipine (NORVASC) 5 MG tablet Take 1 tablet (5 mg total) by mouth daily. 02/14/21  Yes Angiulli, Lavon Paganini, PA-C  aspirin EC 81 MG tablet Take 1 tablet (81 mg total) by mouth daily. Swallow whole. 12/17/20  Yes Angelia Mould, MD  atorvastatin (LIPITOR) 40 MG tablet Take 1 tablet (40 mg total) by mouth daily. 02/14/21  Yes Angiulli, Lavon Paganini, PA-C  bictegravir-emtricitabine-tenofovir AF (BIKTARVY) 50-200-25 MG TABS tablet TAKE 1 TABLET BY MOUTH DAILY. 02/17/21 02/17/22 Yes Michel Bickers, MD  calcitRIOL (ROCALTROL) 0.25 MCG capsule Take 1 capsule (0.25 mcg total) by mouth Every Tuesday,Thursday,and Saturday with dialysis. 04/01/21  Yes Ulyses Amor, PA-C  calcium acetate (PHOSLO) 667 MG capsule Take 3 capsules (2,001 mg total) by mouth 3 (three) times daily with meals. 02/14/21   Yes Angiulli, Lavon Paganini, PA-C  clopidogrel (PLAVIX) 75 MG tablet Take 1 tablet (75 mg total) by mouth daily. 02/14/21  Yes Angiulli, Lavon Paganini, PA-C  gabapentin (NEURONTIN) 100 MG capsule Take 1 capsule (100 mg total) by mouth 3 (three) times a week. Patient taking differently: Take 100-300 mg by mouth See admin instructions. Take 100 mg on Monday, Wednesday, Friday and Sunday 02/14/21  Yes Angiulli, Lavon Paganini, PA-C  insulin glargine (LANTUS SOLOSTAR) 100 UNIT/ML Solostar Pen Inject 8 Units into the skin at bedtime. Patient taking differently: Inject 10 Units into the skin at bedtime. 02/14/21  Yes Angiulli, Lavon Paganini, PA-C  multivitamin (RENA-VIT) TABS tablet Take 1 tablet by mouth daily. 02/14/21  Yes Angiulli, Lavon Paganini, PA-C  oxyCODONE-acetaminophen (PERCOCET/ROXICET) 5-325 MG tablet Take 1 tablet by mouth every 6 (six) hours as needed. 04/02/21  Yes Ulyses Amor, PA-C  pantoprazole (PROTONIX) 40 MG tablet Take 1 tablet (40 mg total) by mouth daily. 02/14/21  Yes Angiulli, Lavon Paganini, PA-C  polyethylene glycol (MIRALAX / GLYCOLAX) 17 g packet Take 17 g by mouth 2 (two) times daily. Patient taking differently: Take 17 g by mouth daily as needed for mild constipation or moderate constipation. 02/14/21  Yes Angiulli, Lavon Paganini, PA-C  sertraline (ZOLOFT) 50 MG tablet Take 1 tablet (50 mg total) by mouth at bedtime. 02/14/21  Yes Angiulli, Lavon Paganini, PA-C  amoxicillin (AMOXIL) 250 MG capsule Take 1 capsule (250 mg total) by mouth 2 (two) times daily. Patient not taking: Reported on 04/18/2021 04/01/21   Ulyses Amor, PA-C  Insulin Pen Needle (PENTIPS) 32G X 4 MM MISC Use as directed 02/14/21   Meredith Staggers, MD  oxyCODONE (OXY IR/ROXICODONE) 5 MG immediate release tablet Take 1-2 tablets (5-10 mg total) by mouth every 6 (six) hours as needed for severe pain. Patient not taking: Reported on 04/18/2021 04/01/21   Ulyses Amor, PA-C   Current Facility-Administered Medications  Medication Dose Route Frequency Provider  Last Rate Last Admin   acetaminophen (TYLENOL) tablet 325-650 mg  325-650 mg Oral Q4H PRN Barbie Banner, PA-C       Or   acetaminophen (TYLENOL) suppository 325-650 mg  325-650 mg Rectal Q4H PRN Barbie Banner, PA-C       albuterol (PROVENTIL) (2.5 MG/3ML) 0.083% nebulizer solution 3 mL  3 mL Inhalation Q4H PRN Setzer, Edman Circle, PA-C       amLODipine (NORVASC) tablet 5 mg  5 mg Oral Daily Barbie Banner, PA-C   5 mg at 04/19/21 1537   aspirin EC tablet 81 mg  81 mg Oral Daily Barbie Banner,  PA-C   81 mg at 04/19/21 1537   atorvastatin (LIPITOR) tablet 40 mg  40 mg Oral Daily Barbie Banner, PA-C   40 mg at 04/19/21 1536   bictegravir-emtricitabine-tenofovir AF (BIKTARVY) 50-200-25 MG per tablet 1 tablet  1 tablet Oral Daily Barbie Banner, PA-C   1 tablet at 04/19/21 1535   bisacodyl (DULCOLAX) suppository 10 mg  10 mg Rectal Daily PRN Wayland Denis, MD       calcitRIOL (ROCALTROL) capsule 0.25 mcg  0.25 mcg Oral Q T,Th,Sa-HD Barbie Banner, PA-C   0.25 mcg at 04/19/21 1536   calcium acetate (PHOSLO) capsule 2,001 mg  2,001 mg Oral TID WC Barbie Banner, PA-C   2,001 mg at 04/19/21 0804   Chlorhexidine Gluconate Cloth 2 % PADS 6 each  6 each Topical Q0600 Adelfa Koh, NP   6 each at 04/19/21 0518   clopidogrel (PLAVIX) tablet 75 mg  75 mg Oral Daily Barbie Banner, PA-C   75 mg at 04/19/21 1537   [START ON 04/20/2021] gabapentin (NEURONTIN) capsule 100 mg  100 mg Oral Once per day on Sun Mon Wed Fri Lucious Groves, DO       guaiFENesin-dextromethorphan (ROBITUSSIN DM) 100-10 MG/5ML syrup 15 mL  15 mL Oral Q4H PRN Barbie Banner, PA-C       heparin injection 5,000 Units  5,000 Units Subcutaneous Q8H Rick Duff, MD   5,000 Units at 04/19/21 1319   hydrALAZINE (APRESOLINE) injection 5 mg  5 mg Intravenous Q20 Min PRN Barbie Banner, PA-C       HYDROmorphone (DILAUDID) injection 0.5 mg  0.5 mg Intravenous Q3H PRN Wayland Denis, MD   0.5 mg at 04/19/21 1314    HYDROmorphone (DILAUDID) injection 0.5 mg  0.5 mg Intravenous Once Masters, Katie, DO       HYDROmorphone (DILAUDID) tablet 2 mg  2 mg Oral Q4H PRN Wayland Denis, MD   2 mg at 04/19/21 0648   insulin aspart (novoLOG) injection 0-9 Units  0-9 Units Subcutaneous TID WC Barbie Banner, PA-C   2 Units at 04/19/21 [id number]   insulin glargine-yfgn (SEMGLEE) injection 10 Units  10 Units Subcutaneous QHS Heloise Purpura, RPH   10 Units at 04/18/21 2122   labetalol (NORMODYNE) injection 10 mg  10 mg Intravenous Q10 min PRN Barbie Banner, PA-C       metoprolol tartrate (LOPRESSOR) injection 2-5 mg  2-5 mg Intravenous Q2H PRN Setzer, Edman Circle, PA-C       multivitamin (RENA-VIT) tablet 1 tablet  1 tablet Oral Daily Barbie Banner, PA-C   1 tablet at 04/19/21 1536   ondansetron (ZOFRAN) injection 4 mg  4 mg Intravenous Q6H PRN Barbie Banner, PA-C   4 mg at 04/19/21 1315   pantoprazole (PROTONIX) EC tablet 40 mg  40 mg Oral Daily Barbie Banner, PA-C   40 mg at 04/19/21 1536   phenol (CHLORASEPTIC) mouth spray 1 spray  1 spray Mouth/Throat PRN Setzer, Edman Circle, PA-C       polyethylene glycol (MIRALAX / GLYCOLAX) packet 17 g  17 g Oral Daily Wayland Denis, MD       sertraline (ZOLOFT) tablet 50 mg  50 mg Oral QHS Barbie Banner, PA-C   50 mg at 04/18/21 2121   Labs: Basic Metabolic Panel: Recent Labs  Lab 04/18/21 1840 04/19/21 0102 04/19/21 0605  NA 135 134* 135  K 5.0 5.8* 5.7*  CL 96* 94* 97*  CO2 21* 21* 18*  GLUCOSE 185* 257* 166*  BUN 46* 52* 56*  CREATININE 9.57* 10.38* 10.68*  CALCIUM 8.2* 8.0* 8.0*   Liver Function Tests: No results for input(s): AST, ALT, ALKPHOS, BILITOT, PROT, ALBUMIN in the last 168 hours. No results for input(s): LIPASE, AMYLASE in the last 168 hours. No results for input(s): AMMONIA in the last 168 hours. CBC: Recent Labs  Lab 04/17/21 1240 04/18/21 1840 04/19/21 0102  WBC 5.7 12.3* 9.2  NEUTROABS 3.9  --   --   HGB 10.0* 10.0* 9.1*  HCT 32.0*  31.2* 28.4*  MCV 96.7 95.1 94.0  PLT 439* 425* 371    CBG: Recent Labs  Lab 04/18/21 1148 04/18/21 1500 04/18/21 2108 04/19/21 0605 04/19/21 1326  GLUCAP 98 120* 294* 163* 118*    ROS: All others negative except those listed in HPI.  Physical Exam: Vitals:   04/19/21 1157 04/19/21 1205 04/19/21 1323 04/19/21 1537  BP: 126/67 136/67 (!) 160/70 (!) 163/62  Pulse: 82 81 87   Resp: 14  12   Temp: 98.8 F (37.1 C)  97.8 F (36.6 C)   TempSrc: Oral  Oral   SpO2: 100%  100%   Weight: 70 kg     Height:         General: chronically ill appearing female in NAD Head: NCAT sclera not icteric MMM Neck: Supple. No lymphadenopathy Lungs: mostly CTA bilaterally. No wheeze, rales or rhonchi. Breathing is unlabored. Heart: RRR. No murmur, rubs or gallops.  Abdomen: soft, nontender, +BS, no guarding, no rebound tenderness Lower extremities:L AKA, R BKA with wound vac in place, no stump edema Neuro: AAOx3. Moves all extremities spontaneously. Psych:  Responds to questions appropriately with a normal affect. Dialysis Access: LU AVF in use  Dialysis Orders:  TTS - [facility]  4hrs, BFR 400, DFR AF 1.5,  EDW 68.5kg, 2K/ 2Ca  Access: LU AVF  Heparin 3000 unit bolus Mircera 150 mcg q2wks - last 8/11 Venofer '50mg'$  qwk Calcitriol 0.42mg PO qHD   Assessment/Plan:  R BKA dehiscence - s/p revision and wound vac placement by Dr. HStanford Breedon 04/18/21  ESRD -  on HD TTS.  Plan for HD today per regular schedule.   Hypertension/volume  - Blood pressure improving with HD.  Continue home meds. Net UF goal 4.5L on HD today.  Remains 1.5L over dry weight post HD.  Continue fluid restrictions.   Anemia of CKD - Hgb 9.1, ESA due, will order with HD Tuesday.  Can be completed as outpatient if does not remain inpatient.   Secondary Hyperparathyroidism -  Ca ok. Will check phos. Continue binders and VDRA.  Nutrition - Renal diet w/fluid restrictions.  DMT2 - per PMD  LJen Mow PA-C [facility] 04/19/2021, 3:55 PM

## 2021-04-20 DIAGNOSIS — N186 End stage renal disease: Secondary | ICD-10-CM | POA: Diagnosis not present

## 2021-04-20 DIAGNOSIS — E119 Type 2 diabetes mellitus without complications: Secondary | ICD-10-CM | POA: Diagnosis not present

## 2021-04-20 DIAGNOSIS — Z992 Dependence on renal dialysis: Secondary | ICD-10-CM | POA: Diagnosis not present

## 2021-04-20 LAB — CBC WITH DIFFERENTIAL/PLATELET
Abs Immature Granulocytes: 0.02 10*3/uL (ref 0.00–0.07)
Basophils Absolute: 0.1 10*3/uL (ref 0.0–0.1)
Basophils Relative: 1 %
Eosinophils Absolute: 0.5 10*3/uL (ref 0.0–0.5)
Eosinophils Relative: 5 %
HCT: 27.6 % — ABNORMAL LOW (ref 36.0–46.0)
Hemoglobin: 8.8 g/dL — ABNORMAL LOW (ref 12.0–15.0)
Immature Granulocytes: 0 %
Lymphocytes Relative: 31 %
Lymphs Abs: 2.7 10*3/uL (ref 0.7–4.0)
MCH: 30 pg (ref 26.0–34.0)
MCHC: 31.9 g/dL (ref 30.0–36.0)
MCV: 94.2 fL (ref 80.0–100.0)
Monocytes Absolute: 0.6 10*3/uL (ref 0.1–1.0)
Monocytes Relative: 7 %
Neutro Abs: 4.7 10*3/uL (ref 1.7–7.7)
Neutrophils Relative %: 56 %
Platelets: 394 10*3/uL (ref 150–400)
RBC: 2.93 MIL/uL — ABNORMAL LOW (ref 3.87–5.11)
RDW: 17.1 % — ABNORMAL HIGH (ref 11.5–15.5)
WBC: 8.5 10*3/uL (ref 4.0–10.5)
nRBC: 0 % (ref 0.0–0.2)

## 2021-04-20 LAB — GLUCOSE, CAPILLARY
Glucose-Capillary: 80 mg/dL (ref 70–99)
Glucose-Capillary: 130 mg/dL — ABNORMAL HIGH (ref 70–99)
Glucose-Capillary: 229 mg/dL — ABNORMAL HIGH (ref 70–99)
Glucose-Capillary: 201 mg/dL — ABNORMAL HIGH (ref 70–99)

## 2021-04-20 LAB — BASIC METABOLIC PANEL
Anion gap: 13 (ref 5–15)
BUN: 23 mg/dL — ABNORMAL HIGH (ref 6–20)
CO2: 27 mmol/L (ref 22–32)
Calcium: 8.6 mg/dL — ABNORMAL LOW (ref 8.9–10.3)
Chloride: 96 mmol/L — ABNORMAL LOW (ref 98–111)
Creatinine, Ser: 5.87 mg/dL — ABNORMAL HIGH (ref 0.44–1.00)
GFR, Estimated: 9 mL/min — ABNORMAL LOW (ref >60)
Glucose, Bld: 53 mg/dL — ABNORMAL LOW (ref 70–99)
Potassium: 3.8 mmol/L (ref 3.5–5.1)
Sodium: 136 mmol/L (ref 135–145)

## 2021-04-20 MED ORDER — INSULIN GLARGINE-YFGN 100 UNIT/ML ~~LOC~~ SOLN
8.0000 [IU] | Freq: Every day | SUBCUTANEOUS | Status: DC
  Administered 2021-04-20 – 2021-04-21 (×2): 8 [IU] via SUBCUTANEOUS
  Filled 2021-04-20 (×2): qty 0.08

## 2021-04-20 MED ORDER — SENNA 8.6 MG PO TABS
1.0000 | ORAL_TABLET | Freq: Every day | ORAL | Status: DC
  Administered 2021-04-20 – 2021-04-22 (×3): 8.6 mg via ORAL
  Filled 2021-04-20 (×3): qty 1

## 2021-04-20 MED ORDER — HYDROMORPHONE HCL 2 MG PO TABS
3.0000 mg | ORAL_TABLET | ORAL | Status: DC | PRN
Start: 2021-04-20 — End: 2021-04-23
  Administered 2021-04-20 – 2021-04-22 (×8): 3 mg via ORAL
  Filled 2021-04-20 (×8): qty 2

## 2021-04-20 MED ORDER — OXYCODONE-ACETAMINOPHEN 5-325 MG PO TABS: 1.0000 | ORAL_TABLET | ORAL | Status: DC | PRN

## 2021-04-20 MED ORDER — HYDROMORPHONE HCL 1 MG/ML IJ SOLN
0.5000 mg | INTRAMUSCULAR | Status: DC | PRN
  Administered 2021-04-20 – 2021-04-22 (×7): 0.5 mg via INTRAVENOUS
  Filled 2021-04-20 (×6): qty 1

## 2021-04-20 NOTE — Progress Notes (Signed)
Subjective: Patient had low blood sugar 53 on her AM BMP. She was given graham crackers and her blood sugar increased to 80 on POC check. She received Lantus 10u ON and 4u of short acting insulin over the last 24 hours. Patient also notes she has persistent pain at her R bka stump. It is a sharp pain.   Objective:  Vital signs in last 24 hours: Vitals:   04/20/21 0435 04/20/21 0521 04/20/21 0732 04/20/21 0851  BP: 140/67  (!) 144/88 (!) 172/64  Pulse: 87 75 79 79  Resp: '16 18 14 15  '$ Temp: 98.5 F (36.9 C)  98.2 F (36.8 C)   TempSrc: Oral  Oral   SpO2: 98% 97% 100% 100%  Weight: 70.2 kg     Height:       Physical Exam: General: NAD HENT: normocephalic, atraumatic EYES: conjunctiva non-erythematous, no scleral icterus CV: regular rate, normal rhythm, no murmurs, rubs, gallops. Pulmonary: sating well on RA, lung clear to auscultation, no rales, wheezes, rhonchi Abdominal: non-distended, soft, non-tender to palpation, normal BS Skin: Warm and dry, no rashes or lesions MSK: s/p L AKA, R BKA wound vac in place with minimal sanguinous fluid, non erythematous, mild TTP Neurological: MS: awake, alert and oriented x3, normal speech and fund of knowledge Motor: moves all extremities antigravity Psych: normal affect   Assessment/Plan:  Active Problems:   Infected surgical wound   BKA stump complication ([place])  Kathy Frank is a 45 yo african [place] female with PMHx of HIV, HTN, HLD, ESRD on HD, Insulin dependent T2DM, peripheral vascular disease, bilateral lower extremity amputations, most recently 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 Trula Slade) due to infection, who presented to [facility] ED on 9/8 for bleeding and wound dehiscence at the R LE amputation site after a fall at home and was admitted for R BKA revision and washout for wound dehiscence and surgical wound infection.  R BKA Wound Dehiscence s/p revision and washout Possible Surgical Wound Infection Concern for  osteomyelitis PVD Patient POD 2 from R BKA revision/washout (Dr. Stanford Breed 04/18/21). Per op note, there was minimal infection, no large pocket of pus. Antibiotics were discontinued 9/10. Periosteal reaction on xray could be secondary to recent revision on 8/19 rather than OM we will continue to watch for systemic signs of infection. Pain not well controlled on current regimen. Patient used IV HM 0.'5mg'$  x4 and Hm'2mg'$  x2 over the last 24 hours. Wound vac draining serosanguinous fluid.  -Increased PO Dilaudid '3mg'$  PO q4hr PRN for mod/sev pain, encouraged patient to use oral medications  -Continue Dilaudid 0.'5mg'$  IV q3hr PRN for breakthrough pain  -Zofran PRN -bowel regimen: miralax scheduled, senna, dulcolax supp PRN - negative pressure wound therapy nursing order -Wound care consult, will need wound vac supplies -TOC consult, may need HHRN for help with wound vac -continue home aspirin '81mg'$ , plavix '75mg'$  daily  ESRD on HD TThSa Patient has ESRD likely secondary to HTN and DM, cannot exclude HIV associated nephropathy. Missed HD on 9/8 due to ED visit. Nephrology consulted for inpatient admission. She underwent HD 9/10 with 4.5L UF removed.  -IP HD per nephrology  -heparin per neprhology -phoslo, rocaltrol, renal vitamin restarted  Insulin Dependent T2DM  Diabetic peripheral angiopathy Diabetic retinopathy  Gastroparesis Patient on 10u Lantus qHS at home. On 10u LA and received 4u of short acting over the last 24 hours. She had low blood sugar this AM that improved with crackers on POC. Previous AM blood glucose elevated  to 160s to 200s. Will hold on changing patient's insulin regimen as she requires strict blood glucose control to promote healing at her recent surgical incision. Will encourage small nighttime snack to prevent low blood glucoses and will continue to monitor for further episodes of low blood sugar. Continue gabapentin '100mg'$  TID.  -SSI + lantus 10u monitor CBGs  HTN HLD Patient  hypertensive too [id number] systolic on arrival in the setting of acute wound infection and pain. Patient's blood pressures normalizing mostly [id number] systolic.  -Continue on home amlodipine '5mg'$  daily, and lipitor '40mg'$  daily  HIV Continue Biktarvy 50-200 daily  Depression Continue on Zoloft '50mg'$  qhs   GERD Continue on Protonix '40mg'$  daily  Diet: renal carb modified VTE: heparin per nephrology IVF: none Code: Full  Prior to Admission Living Arrangement: Home Anticipated Discharge Location: Home Barriers to Discharge: pending clinical improvement Dispo: Anticipated discharge in approximately 1-2 day(s).   Rick Duff, MD 04/20/2021, 9:40 AM Pager: [phone] After 5pm on weekdays and 1pm on weekends: On Call pager [phone]

## 2021-04-20 NOTE — Progress Notes (Addendum)
[facility] Progress Note   Subjective:   Patient seen and examined at bedside.  Reports increased pain overnight and nausea/vomiting after HD yesterday.  Continues to have nausea this AM.  Denies CP, SOB, abdominal pain and diarrhea.    Objective Vitals:   04/20/21 0435 04/20/21 0521 04/20/21 0732 04/20/21 0851  BP: 140/67  (!) 144/88 (!) 172/64  Pulse: 87 75 79 79  Resp: '16 18 14 15  '$ Temp: 98.5 F (36.9 C)  98.2 F (36.8 C)   TempSrc: Oral  Oral   SpO2: 98% 97% 100% 100%  Weight: 70.2 kg     Height:       Physical Exam General:chronically ill appearing female in NAD Heart:RRR, no mrg Lungs: CTAB, nml WOB on RA Abdomen:soft, NTND Extremities:L AKA, R BKA with wound vac in place Dialysis Access: LU AVF +b/t   Filed Weights   04/19/21 0815 04/19/21 1157 04/20/21 0435  Weight: 74.8 kg 70 kg 70.2 kg    Intake/Output Summary (Last 24 hours) at 04/20/2021 1209 Last data filed at 04/20/2021 0848 Gross per 24 hour  Intake 340 ml  Output 400 ml  Net -60 ml    Additional Objective Labs: Basic Metabolic Panel: Recent Labs  Lab 04/19/21 0102 04/19/21 0605 04/19/21 1653 04/20/21 0052  NA 134* 135  --  136  K 5.8* 5.7*  --  3.8  CL 94* 97*  --  96*  CO2 21* 18*  --  27  GLUCOSE 257* 166*  --  53*  BUN 52* 56*  --  23*  CREATININE 10.38* 10.68*  --  5.87*  CALCIUM 8.0* 8.0*  --  8.6*  PHOS  --   --  8.5*  --     CBC: Recent Labs  Lab 04/17/21 1240 04/18/21 1840 04/19/21 0102 04/20/21 0052  WBC 5.7 12.3* 9.2 8.5  NEUTROABS 3.9  --   --  4.7  HGB 10.0* 10.0* 9.1* 8.8*  HCT 32.0* 31.2* 28.4* 27.6*  MCV 96.7 95.1 94.0 94.2  PLT 439* 425* 371 394    CBG: Recent Labs  Lab 04/19/21 0605 04/19/21 1326 04/19/21 1632 04/19/21 2114 04/20/21 0550  GLUCAP 163* 118* 173* 142* 80    Medications:   amLODipine  5 mg Oral Daily   aspirin EC  81 mg Oral Daily   atorvastatin  40 mg Oral Daily   bictegravir-emtricitabine-tenofovir AF  1 tablet  Oral Daily   calcitRIOL  0.25 mcg Oral Q T,Th,Sa-HD   calcium acetate  2,001 mg Oral TID WC   Chlorhexidine Gluconate Cloth  6 each Topical Q0600   clopidogrel  75 mg Oral Daily   [START ON 04/22/2021] darbepoetin (ARANESP) injection - DIALYSIS  60 mcg Intravenous Q Tue-HD   gabapentin  100 mg Oral Once per day on Sun Mon Wed Fri   heparin  5,000 Units Subcutaneous Q8H   [START ON 04/21/2021] influenza vac split quadrivalent PF  0.5 mL Intramuscular Once   insulin aspart  0-9 Units Subcutaneous TID WC   insulin glargine-yfgn  8 Units Subcutaneous QHS   multivitamin  1 tablet Oral Daily   pantoprazole  40 mg Oral Daily   polyethylene glycol  17 g Oral Daily   senna  1 tablet Oral Daily   sertraline  50 mg Oral QHS    Dialysis Orders: TTS - [facility]  4hrs, BFR 400, DFR AF 1.5,  EDW 68.5kg, 2K/ 2Ca   Access: LU AVF  Heparin 3000 unit bolus Mircera  150 mcg q2wks - last 8/11 Venofer '50mg'$  qwk Calcitriol 0.47mg PO qHD     Assessment/Plan:  R BKA dehiscence - s/p revision and wound vac placement by Dr. HStanford Breedon 04/18/21.  More pain overnight, vascular added PO pain meds.   ESRD -  on HD TTS. HD yesterday tolerated well.  Next HD on 9/13.  Hypertension/volume  - BP variable.  Continue home meds.  Not quite to EDW.  Reports n/v post HD, net UF 4.5L removed.  Was meeting dry weight as outpatient.  Continue fluid restrictions and UF to dry as tolerated.   Anemia of CKD - Hgb drop 8.8 post surgery. ESA due, will order with HD Tuesday.  Can be completed as outpatient if does not remain inpatient.   Secondary Hyperparathyroidism -  Ca ok. Phos elevated, continue binders and VDRA.  Nutrition - Renal diet w/fluid restrictions.  DMT2 - per PMD  LJen Mow PA-C [facility] 04/20/2021,12:09 PM  LOS: 3 days   Pt seen, examined and agree w A/P as above.  RKelly Splinter MD 04/20/2021, 3:50 PM

## 2021-04-20 NOTE — Progress Notes (Addendum)
  Progress Note    04/20/2021 8:12 AM 2 Days Post-Op  Subjective:  c/o pain in her right stump  Afebrile  Vitals:   04/20/21 0521 04/20/21 0732  BP:  (!) 144/88  Pulse: 75 79  Resp: 18 14  Temp:  98.2 F (36.8 C)  SpO2: 97% 100%    Physical Exam: Incisions:  wound vac in place with good seal   CBC    Component Value Date/Time   WBC 8.5 04/20/2021 0052   RBC 2.93 (L) 04/20/2021 0052   HGB 8.8 (L) 04/20/2021 0052   HGB 9.8 (L) 07/18/2018 1133   HCT 27.6 (L) 04/20/2021 0052   HCT 19.3 (L) 10/09/2018 1405   PLT 394 04/20/2021 0052   PLT 417 07/18/2018 1133   MCV 94.2 04/20/2021 0052   MCV 89 07/18/2018 1133   MCH 30.0 04/20/2021 0052   MCHC 31.9 04/20/2021 0052   RDW 17.1 (H) 04/20/2021 0052   RDW 12.9 07/18/2018 1133   LYMPHSABS 2.7 04/20/2021 0052   LYMPHSABS 1.6 12/23/2017 1558   MONOABS 0.6 04/20/2021 0052   EOSABS 0.5 04/20/2021 0052   EOSABS 0.2 12/23/2017 1558   BASOSABS 0.1 04/20/2021 0052   BASOSABS 0.1 12/23/2017 1558    BMET    Component Value Date/Time   NA 136 04/20/2021 0052   NA 138 12/23/2017 1558   K 3.8 04/20/2021 0052   CL 96 (L) 04/20/2021 0052   CO2 27 04/20/2021 0052   GLUCOSE 53 (L) 04/20/2021 0052   BUN 23 (H) 04/20/2021 0052   BUN 28 (H) 12/23/2017 1558   CREATININE 5.87 (H) 04/20/2021 0052   CREATININE 12.07 (H) 05/29/2020 1155   CALCIUM 8.6 (L) 04/20/2021 0052   CALCIUM 8.5 (L) 11/25/2018 0309   GFRNONAA 9 (L) 04/20/2021 0052   GFRNONAA 79 11/14/2014 1154   GFRAA 5 (L) 01/06/2020 1329   GFRAA >89 11/14/2014 1154    INR    Component Value Date/Time   INR 1.0 03/28/2021 0546     Intake/Output Summary (Last 24 hours) at 04/20/2021 0812 Last data filed at 04/19/2021 2100 Gross per 24 hour  Intake 220 ml  Output 4900 ml  Net -4680 ml     Assessment/Plan:  45 y.o. female is s/p  Incision and drainage of right below-knee amputation stump Application of negative pressure wound therapy system   2 Days  Post-Op  -pt with wound vac in place right stump-this will be changed tomorrow by [facility] nurse.   -pt continues to have pain-she does not have any po pain meds ordered.  Will order percocet 5/325 1-2 q4h prn pain in addition to IV pain meds.    Leontine Locket, PA-C [facility] [phone] 04/20/2021 8:12 AM   VASCULAR STAFF ADDENDUM: I have independently interviewed and examined the patient. I agree with the above.  Continue vac to BKA stump. Will add PO pain medicine. Will ask wound nurse to start dressing changes tomorrow.  [name]. Stanford Breed, MD [facility] Phone Number: [phone] 04/20/2021 9:18 AM

## 2021-04-21 ENCOUNTER — Encounter (HOSPITAL_COMMUNITY): Payer: Self-pay | Admitting: Vascular Surgery

## 2021-04-21 DIAGNOSIS — T879 Unspecified complications of amputation stump: Secondary | ICD-10-CM

## 2021-04-21 DIAGNOSIS — T8130XA Disruption of wound, unspecified, initial encounter: Secondary | ICD-10-CM | POA: Diagnosis not present

## 2021-04-21 LAB — HEPATITIS B SURFACE ANTIGEN: Hepatitis B Surface Ag: NONREACTIVE

## 2021-04-21 LAB — BASIC METABOLIC PANEL
Anion gap: 14 (ref 5–15)
BUN: 35 mg/dL — ABNORMAL HIGH (ref 6–20)
CO2: 25 mmol/L (ref 22–32)
Calcium: 8.7 mg/dL — ABNORMAL LOW (ref 8.9–10.3)
Chloride: 94 mmol/L — ABNORMAL LOW (ref 98–111)
Creatinine, Ser: 8.12 mg/dL — ABNORMAL HIGH (ref 0.44–1.00)
GFR, Estimated: 6 mL/min — ABNORMAL LOW (ref >60)
Glucose, Bld: 232 mg/dL — ABNORMAL HIGH (ref 70–99)
Potassium: 4.4 mmol/L (ref 3.5–5.1)
Sodium: 133 mmol/L — ABNORMAL LOW (ref 135–145)

## 2021-04-21 LAB — CBC
HCT: 26 % — ABNORMAL LOW (ref 36.0–46.0)
Hemoglobin: 8.2 g/dL — ABNORMAL LOW (ref 12.0–15.0)
MCH: 29.6 pg (ref 26.0–34.0)
MCHC: 31.5 g/dL (ref 30.0–36.0)
MCV: 93.9 fL (ref 80.0–100.0)
Platelets: 343 10*3/uL (ref 150–400)
RBC: 2.77 MIL/uL — ABNORMAL LOW (ref 3.87–5.11)
RDW: 16.9 % — ABNORMAL HIGH (ref 11.5–15.5)
WBC: 5.9 10*3/uL (ref 4.0–10.5)
nRBC: 0 % (ref 0.0–0.2)

## 2021-04-21 LAB — GLUCOSE, CAPILLARY
Glucose-Capillary: 199 mg/dL — ABNORMAL HIGH (ref 70–99)
Glucose-Capillary: 188 mg/dL — ABNORMAL HIGH (ref 70–99)
Glucose-Capillary: 179 mg/dL — ABNORMAL HIGH (ref 70–99)
Glucose-Capillary: 269 mg/dL — ABNORMAL HIGH (ref 70–99)

## 2021-04-21 LAB — HEPATITIS B SURFACE ANTIBODY,QUALITATIVE: Hep B S Ab: REACTIVE — AB

## 2021-04-21 MED ORDER — HYDRALAZINE HCL 25 MG PO TABS
25.0000 mg | ORAL_TABLET | Freq: Three times a day (TID) | ORAL | Status: DC
  Administered 2021-04-21 – 2021-04-22 (×4): 25 mg via ORAL
  Filled 2021-04-21 (×4): qty 1

## 2021-04-21 MED ORDER — AMLODIPINE BESYLATE 10 MG PO TABS
10.0000 mg | ORAL_TABLET | Freq: Every day | ORAL | Status: DC
  Administered 2021-04-21 – 2021-04-22 (×2): 10 mg via ORAL
  Filled 2021-04-21 (×2): qty 1

## 2021-04-21 MED ORDER — SODIUM CHLORIDE 0.9 % IV SOLN: 100.0000 mL | INTRAVENOUS | Status: DC | PRN

## 2021-04-21 MED ORDER — LIDOCAINE-PRILOCAINE 2.5-2.5 % EX CREA: 1.0000 "application " | TOPICAL_CREAM | CUTANEOUS | Status: DC | PRN

## 2021-04-21 MED ORDER — PENTAFLUOROPROP-TETRAFLUOROETH EX AERO
1.0000 | INHALATION_SPRAY | CUTANEOUS | Status: DC | PRN
Start: 2021-04-21 — End: 2021-04-22

## 2021-04-21 MED ORDER — HEPARIN SODIUM (PORCINE) 1000 UNIT/ML DIALYSIS: 1000.0000 [IU] | INTRAMUSCULAR | Status: DC | PRN

## 2021-04-21 MED ORDER — SODIUM CHLORIDE 0.9 % IV SOLN
100.0000 mL | INTRAVENOUS | Status: DC | PRN
  Administered 2021-04-21: 100 mL via INTRAVENOUS

## 2021-04-21 MED ORDER — ALTEPLASE 2 MG IJ SOLR: 2.0000 mg | Freq: Once | INTRAMUSCULAR | Status: DC | PRN

## 2021-04-21 MED ORDER — CYCLOBENZAPRINE HCL 10 MG PO TABS
5.0000 mg | ORAL_TABLET | Freq: Two times a day (BID) | ORAL | Status: DC
  Administered 2021-04-21 (×2): 5 mg via ORAL
  Filled 2021-04-21 (×2): qty 1

## 2021-04-21 MED ORDER — LIDOCAINE HCL (PF) 1 % IJ SOLN: 5.0000 mL | INTRAMUSCULAR | Status: DC | PRN

## 2021-04-21 NOTE — TOC Initial Note (Addendum)
Transition of Care (TOC) - Initial/Assessment Note  Marvetta Gibbons RN, BSN Transitions of Care Unit 4E- RN Case Manager See Treatment Team for direct phone #    Patient Details  Name: Kathy Frank MRN: [id number] Date of Birth: [birth year]  Transition of Care [facility]) CM/SW Contact:    Dawayne Patricia, RN Phone Number: 04/21/2021, 12:42 PM  Clinical Narrative:                 Pt s/p wound VAC placement- HHRN order has been placed for transition home. Pt will need home wound VAC- order form for [employer] VAC has been placed on shadow chart for MD to sign- once signed will fax to [facility] for insurance approval.   1400- update- [facility] wound VAC form signed- faxed to [facility] and spoke with Olivia Mackie regarding home wound VAC need.   CM in to speak to pt at bedside regarding transition needs. Per pt she has all needed DME at home, personal wheelchair at the bedside. Pt states that she will have transportation home.  Discussed home wound VAC and approval process, pt voiced understanding, also provided pt with [facility] choice list Per CMS guidelines from medicare.gov website with star ratings (copy placed in shadow chart) - pt states she has used [facility] in the past and would like to use them again. Discussed if [facility] unable to accept referral would pt have another preference- pt stated she does not and would defer to CM to secure and agency on her behalf.   Address, phone # and PCP all confirmed in epic  Call made to [facility] with [facility] regarding [place] needs- referral pending review.    Expected Discharge Plan: [place] Services Barriers to Discharge: Other (must enter comment) (pending VAC approval)   Patient Goals and CMS Choice Patient states their goals for this hospitalization and ongoing recovery are:: return home CMS Medicare.gov Compare Post Acute Care list provided to:: Patient Choice offered to / list presented to : Patient  Expected Discharge Plan and Services Expected Discharge Plan: [place]   Discharge Planning Services: CM Consult Post Acute Care Choice: Durable Medical Equipment, Home Health Living arrangements for the past 2 months: Single Family Home                 DME Arranged: Vac DME Agency: [facility] Date DME Agency Contacted: 04/21/21   Representative spoke with at DME Agency: Olivia Mackie HH Arranged: RN [place] Agency: [place] ([place]) Date [place]: 04/21/21 Time HH Agency Contacted: 58 Representative spoke with at [place]: Ramond Marrow  Prior Living Arrangements/Services Living arrangements for the past 2 months: [place] Lives with:: Adult Children Patient language and need for interpreter reviewed:: Yes Do you feel safe going back to the place where you live?: Yes      Need for Family Participation in Patient Care: Yes (Comment) Care giver support system in place?: Yes (comment) Current home services: DME (wheelchair) Criminal Activity/Legal Involvement Pertinent to Current Situation/Hospitalization: No - Comment as needed  Activities of Daily Living      Permission Sought/Granted Permission sought to share information with : Facility Art therapist granted to share information with : Yes, Verbal Permission Granted     Permission granted to share info w AGENCY: HH/DME        Emotional Assessment Appearance:: Appears stated age Attitude/Demeanor/Rapport: Engaged Affect (typically observed): Appropriate, Accepting, Pleasant Orientation: : Oriented to Self, Oriented to Place, Oriented to  Time, Oriented to Situation Alcohol / Substance Use: Not Applicable Psych Involvement: No (comment)  Admission diagnosis:  Infected surgical wound [T81.49XA] Wound dehiscence [id number] BKA stump complication (HCC) [id number] Patient Active Problem List   Diagnosis Date Noted   BKA stump complication ([place]) [id number]   Infected surgical wound 04/17/2021   PAD (peripheral artery disease) ([place]) 03/28/2021    Right below-knee amputee ([place]) 02/08/2021   Gangrene of right foot ([place]) 01/29/2021   Peripheral arterial disease ([place]) 01/29/2021   Anxiety 05/28/2020   Headache, unspecified 05/02/2020   Patient's other noncompliance with medication regimen 12/28/2019   Carbuncle and furuncle of buttock 12/15/2019   Other hypoglycemia 12/12/2019   Dyspnea 07/01/2019   Iron deficiency anemia, unspecified [id number]   Complication of vascular dialysis catheter 04/24/2019   Major depressive disorder, single episode, unspecified 04/24/2019   Secondary hyperparathyroidism of renal origin ([place]) 04/24/2019   S/P AKA (above knee amputation), left ([place]) 12/16/2018   Anemia due to chronic kidney disease    Moderate protein-calorie malnutrition (HCC)    Adjustment disorder with mixed anxiety and depressed mood 08/15/2018   ESRD (end stage renal disease) ([place])    Fecal incontinence 02/04/2018   Healthcare maintenance 09/17/2017   Aortic atherosclerosis ([place]) 09/30/2016   Diabetic foot ulcer associated with diabetes mellitus due to underlying condition ([place]) 07/08/2016   Human immunodeficiency virus (HIV) disease ([place]) 04/23/2016   Gastroparesis    Moderate nonproliferative diabetic retinopathy of both eyes ([place]) 11/21/2014   Esophageal reflux    Microscopic hematuria 10/31/2014   Hypertension 11/16/2013   Tobacco use disorder 06/28/2006   Depression 06/28/2006   Diabetes mellitus type 2 in obese ([place]) 06/28/1994   PCP:  [name], MD Pharmacy:   [place] ([place]), [place] - [address] [place] ([place]) [place] [address] Phone: [phone] Fax: [place] [address] [place] [address] Phone: [phone] Fax: [phone]     Social Determinants of Health (SDOH) Interventions    Readmission Risk Interventions Readmission Risk Prevention Plan 12/19/2018 10/29/2018  Transportation Screening  Complete Complete  Medication Review ([place]) Referral to Pharmacy Referral to Pharmacy  PCP or Specialist appointment within 3-5 days of discharge Complete Not Complete  PCP/Specialist Appt Not Complete comments - (No Data)  [place] or Home Care Consult Complete Complete  SW Recovery Care/Counseling Consult Complete -  Palliative Care Screening Not Applicable Not [place] Not Applicable Not Applicable  Some recent data might be hidden

## 2021-04-21 NOTE — Progress Notes (Addendum)
  Progress Note    04/21/2021 7:33 AM 3 Days Post-Op  Subjective:  says the pain is a little better with addition of by mouth pain med.  Afebrile  Vitals:   04/20/21 2324 04/21/21 0328  BP: (!) 154/55 (!) 181/63  Pulse: 87 83  Resp: 16 15  Temp: 98.5 F (36.9 C) 98.7 F (37.1 C)  SpO2: 100% 100%    Physical Exam: Incisions:       CBC    Component Value Date/Time   WBC 5.9 04/21/2021 0205   RBC 2.77 (L) 04/21/2021 0205   HGB 8.2 (L) 04/21/2021 0205   HGB 9.8 (L) 07/18/2018 1133   HCT 26.0 (L) 04/21/2021 0205   HCT 19.3 (L) 10/09/2018 1405   PLT 343 04/21/2021 0205   PLT 417 07/18/2018 1133   MCV 93.9 04/21/2021 0205   MCV 89 07/18/2018 1133   MCH 29.6 04/21/2021 0205   MCHC 31.5 04/21/2021 0205   RDW 16.9 (H) 04/21/2021 0205   RDW 12.9 07/18/2018 1133   LYMPHSABS 2.7 04/20/2021 0052   LYMPHSABS 1.6 12/23/2017 1558   MONOABS 0.6 04/20/2021 0052   EOSABS 0.5 04/20/2021 0052   EOSABS 0.2 12/23/2017 1558   BASOSABS 0.1 04/20/2021 0052   BASOSABS 0.1 12/23/2017 1558    BMET    Component Value Date/Time   NA 133 (L) 04/21/2021 0205   NA 138 12/23/2017 1558   K 4.4 04/21/2021 0205   CL 94 (L) 04/21/2021 0205   CO2 25 04/21/2021 0205   GLUCOSE 232 (H) 04/21/2021 0205   BUN 35 (H) 04/21/2021 0205   BUN 28 (H) 12/23/2017 1558   CREATININE 8.12 (H) 04/21/2021 0205   CREATININE 12.07 (H) 05/29/2020 1155   CALCIUM 8.7 (L) 04/21/2021 0205   CALCIUM 8.5 (L) 11/25/2018 0309   GFRNONAA 6 (L) 04/21/2021 0205   GFRNONAA 79 11/14/2014 1154   GFRAA 5 (L) 01/06/2020 1329   GFRAA >89 11/14/2014 1154    INR    Component Value Date/Time   INR 1.0 03/28/2021 0546     Intake/Output Summary (Last 24 hours) at 04/21/2021 0733 Last data filed at 04/21/2021 0601 Gross per 24 hour  Intake 360 ml  Output 100 ml  Net 260 ml     Assessment/Plan:  45 y.o. female is s/p Incision and drainage of right below-knee amputation stump Application of negative pressure  wound therapy system 3 Days Post-Op  -wound vac removed - see pic above.  Wet to dry dressing placed.  WOC to reapply wound vac this am. -pain better with addition of po pain meds  -pt says she will be going to be going home and not to facility.  Will put in for [facility] and face to face for [facility] (done).   Leontine Locket, PA-C [facility] [phone] 04/21/2021 7:33 AM   VASCULAR STAFF ADDENDUM: I have independently interviewed and examined the patient. I agree with the above.   [name]. Stanford Breed, MD [facility] Phone Number: [phone] 04/21/2021 8:43 AM

## 2021-04-21 NOTE — Progress Notes (Addendum)
[facility] Progress Note   Subjective:    Seen and examined patient at bedside. Still reports mild pain R stump. Denies SOB and CP. Plan for HD 9/13 if patient is still here.  Objective Vitals:   04/20/21 2324 04/21/21 0328 04/21/21 0853 04/21/21 1217  BP: (!) 154/55 (!) 181/63 (!) 189/78 (!) 163/73  Pulse: 87 83 75 78  Resp: '16 15 18 20  '$ Temp: 98.5 F (36.9 C) 98.7 F (37.1 C) 98.2 F (36.8 C) 98.5 F (36.9 C)  TempSrc: Oral Oral Oral Oral  SpO2: 100% 100% 93% 100%  Weight:  73.3 kg    Height:       Physical Exam General: Chronically-ill appearing; NAD Heart: S1 and S2; Breathing unlabored; on RA Lungs: Clear throughout; No wheezing, rales, and rhonchi Abdomen: Soft and non-tender; active bowel sounds Extremities: L AKA; R BKA-ace wrap in place Dialysis Access: L AVF (+) Bruit/Thrill   Filed Weights   04/19/21 1157 04/20/21 0435 04/21/21 0328  Weight: 70 kg 70.2 kg 73.3 kg    Intake/Output Summary (Last 24 hours) at 04/21/2021 1228 Last data filed at 04/21/2021 0601 Gross per 24 hour  Intake 240 ml  Output 100 ml  Net 140 ml    Additional Objective Labs: Basic Metabolic Panel: Recent Labs  Lab 04/19/21 0605 04/19/21 1653 04/20/21 0052 04/21/21 0205  NA 135  --  136 133*  K 5.7*  --  3.8 4.4  CL 97*  --  96* 94*  CO2 18*  --  27 25  GLUCOSE 166*  --  53* 232*  BUN 56*  --  23* 35*  CREATININE 10.68*  --  5.87* 8.12*  CALCIUM 8.0*  --  8.6* 8.7*  PHOS  --  8.5*  --   --    Liver Function Tests: No results for input(s): AST, ALT, ALKPHOS, BILITOT, PROT, ALBUMIN in the last 168 hours. No results for input(s): LIPASE, AMYLASE in the last 168 hours. CBC: Recent Labs  Lab 04/17/21 1240 04/18/21 1840 04/19/21 0102 04/20/21 0052 04/21/21 0205  WBC 5.7 12.3* 9.2 8.5 5.9  NEUTROABS 3.9  --   --  4.7  --   HGB 10.0* 10.0* 9.1* 8.8* 8.2*  HCT 32.0* 31.2* 28.4* 27.6* 26.0*  MCV 96.7 95.1 94.0 94.2 93.9  PLT 439* 425* 371 394 343    Blood Culture    Component Value Date/Time   SDES BLOOD RIGHT ANTECUBITAL 01/28/2021 1812   SPECREQUEST  01/28/2021 1812    BOTTLES DRAWN AEROBIC AND ANAEROBIC Blood Culture adequate volume   CULT  01/28/2021 1812    NO GROWTH 5 DAYS Performed at [facility] [address], [place], [place] [address]    REPTSTATUS 02/02/2021 FINAL 01/28/2021 1812    Cardiac Enzymes: No results for input(s): CKTOTAL, CKMB, CKMBINDEX, TROPONINI in the last 168 hours. CBG: Recent Labs  Lab 04/20/21 1229 04/20/21 1740 04/20/21 2112 04/21/21 0619 04/21/21 1214  GLUCAP 130* 229* 201* 199* 188*   Iron Studies: No results for input(s): IRON, TIBC, TRANSFERRIN, FERRITIN in the last 72 hours. Lab Results  Component Value Date   INR 1.0 03/28/2021   INR 1.3 (H) 01/28/2021   INR 1.1 05/03/2019   Studies/Results: No results found.  Medications:   amLODipine  10 mg Oral Daily   aspirin EC  81 mg Oral Daily   atorvastatin  40 mg Oral Daily   bictegravir-emtricitabine-tenofovir AF  1 tablet Oral Daily   calcitRIOL  0.25  mcg Oral Q T,Th,Sa-HD   calcium acetate  2,001 mg Oral TID WC   Chlorhexidine Gluconate Cloth  6 each Topical Q0600   clopidogrel  75 mg Oral Daily   cyclobenzaprine  5 mg Oral BID   [START ON 04/22/2021] darbepoetin (ARANESP) injection - DIALYSIS  60 mcg Intravenous Q Tue-HD   gabapentin  100 mg Oral Once per day on Sun Mon Wed Fri   heparin  5,000 Units Subcutaneous Q8H   hydrALAZINE  25 mg Oral Q8H   influenza vac split quadrivalent PF  0.5 mL Intramuscular Once   insulin aspart  0-9 Units Subcutaneous TID WC   insulin glargine-yfgn  8 Units Subcutaneous QHS   multivitamin  1 tablet Oral Daily   pantoprazole  40 mg Oral Daily   polyethylene glycol  17 g Oral Daily   senna  1 tablet Oral Daily   sertraline  50 mg Oral QHS    Dialysis Orders: TTS - [facility] 4hrs, BFR 400, DFR AF 1.5,  EDW 68.5kg, 2K/ 2Ca   Access: LU AVF  Heparin 3000 unit bolus Mircera 150 mcg  q2wks - last 8/11 Venofer '50mg'$  qwk Calcitriol 0.87mg PO qHD   Assessment/Plan: R BKA dehiscence - s/p revision and wound vac placement by Dr. HStanford Breedon 04/18/21.  Still reports pain-PO pain meds added by vascular.  ESRD -  on HD TTS. HD yesterday tolerated well.  Next HD on 9/13. Hypertension/volume  - BP variable.  Continue home meds.  Not quite to EDW.  Reports n/v post HD, net UF 4.5L removed 9/10.  Was meeting dry weight as outpatient.  Continue fluid restrictions and UF to dry as tolerated.  Anemia of CKD - Hgb drop-now 8.2 post surgery. ESA ordered HD Tuesday 9/12.  Can be completed as outpatient if does not remain inpatient.  Secondary Hyperparathyroidism -  Ca ok. Phos elevated, continue binders and VDRA. Nutrition - Renal diet w/fluid restrictions.  DMT2 - per PMD  CTobie Poet NP [facility] 04/21/2021,12:28 PM  LOS: 4 days   Seen and examined independently.  Agree with note and exam as documented above by physician extender and as noted here.  General adult female in bed in no acute distress HEENT normocephalic atraumatic extraocular movements intact sclera anicteric Neck supple trachea midline Lungs clear to auscultation bilaterally normal work of breathing at rest; room air  Heart S1S2 no rub Abdomen soft nontender nondistended Extremities bilateral lower extremity amputations; wound vac being changed now on my exam this am Psych normal mood and affect  ESRD on HD TTS Right BKA with wound dehiscence; vac being changed; per vascular HTN - optimize volume as tolerated with HD Anemia CKD - for ESA 9/12  LClaudia Desanctis MD 2:26 PM 04/21/2021

## 2021-04-21 NOTE — Progress Notes (Addendum)
Mobility Specialist Progress Note    04/21/21 1647  Mobility  Activity  (wheelchair mobility)  Level of Assistance Modified independent, requires aide device or extra time  Assistive Device Wheelchair  Distance Ambulated (ft) 330 ft  Mobility Ambulated with assistance in hallway  Mobility Response Tolerated well  Mobility performed by Mobility specialist  $Mobility charge 1 Mobility   Pre-Mobility: 81 HR, 161/72 BP, 100% SpO2 During Mobility: 81 HR Post-Mobility: 77 HR, 178/71 BP  Pt awoken in bed and agreeable to ambulation. Performed posterior/anterior scoot in/out of wheelchair/bed. Pt groggy during ambulation and c/o some stump pain and light nausea. Pt returned to bed with call bell. Pt requesting nausea medication and RN notified.   Hildred Alamin Mobility Specialist  Mobility Specialist Phone: [phone]

## 2021-04-21 NOTE — Progress Notes (Signed)
Subjective: No acute events or concerns overnight. Patient feels well this morning. Feels her pain is fairly adequately controlled on PO and IV dilaudid though having spasms in R leg. Had a BM in last 24hrs. Denies fevers, abdominal pain, dysuria.    Objective:  Vital signs in last 24 hours: Vitals:   04/20/21 2324 04/21/21 0328 04/21/21 0853 04/21/21 1217  BP: (!) 154/55 (!) 181/63 (!) 189/78 (!) 163/73  Pulse: 87 83 75 78  Resp: '16 15 18 20  '$ Temp: 98.5 F (36.9 C) 98.7 F (37.1 C) 98.2 F (36.8 C) 98.5 F (36.9 C)  TempSrc: Oral Oral Oral Oral  SpO2: 100% 100% 93% 100%  Weight:  73.3 kg    Height:       Physical Exam: General: Well appearing african [place] female, NAD HENT: normocephalic, atraumatic EYES: conjunctiva non-erythematous, no scleral icterus CV: regular rate, normal rhythm, no murmurs, rubs, gallops. Pulmonary: sating well on RA, lung clear to auscultation, no rales, wheezes, rhonchi Abdominal: non-distended, soft, non-tender to palpation, normal BS Skin: Warm and dry, no rashes or lesions MSK: bilateral leg amputations, R BKA wound vac in place with minimal serosanguinous fluid draining Neurological: MS: awake, alert and oriented x3, normal speech and fund of knowledge Motor: moves all extremities antigravity Psych: normal affect   Assessment/Plan:  Active Problems:   Infected surgical wound   BKA stump complication ([place])  Kathy Frank is a 45 yo african [place] female with PMHx of HIV, HTN, HLD, ESRD on HD, Insulin dependent T2DM, peripheral vascular disease, bilateral lower extremity amputations, most recently 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 Trula Slade) due to infection, who presented to [facility] ED on 9/8 for bleeding and wound dehiscence at the R LE amputation site after a fall at home and was admitted for R BKA revision and washout for wound dehiscence and possible surgical wound infection.  R BKA Wound Dehiscence s/p revision and  washout Possible Surgical Wound Infection PVD Patient POD 3 from R BKA revision/washout (Dr. Stanford Breed 04/18/21). Per op note, there was minimal infection, no large pocket. Discussed case with Dr. Stanford Breed on 9/10 and he felt that was reasonable to discontinue antibiotics, low concern for infection. Periosteal reaction on xray could be secondary to recent revision on 8/19 rather than OM. Pain adequately managed. Will add on low dose flexeril for spasms. Wound vac draining serosanguinous fluid. Patient will need HHRN and home wound vac for x3 weekly wound vac changes.  -Dilaudid '3mg'$  PO q4hr PRN for mod/sev pain, no need for additional PO oxycodone -Dilaudid 0.'5mg'$  IV q3hr PRN for breakthrough pain  -Flexeril '5mg'$  BID -Zofran PRN -bowel regimen: miralax scheduled, senna scheduled, dulcolax supp PRN -Wound care consult -Discharge pending set up: HHRN for wound vac changes -continue home aspirin '81mg'$ , plavix '75mg'$  daily -f/u with vascular surgery after discharge  ESRD on HD TThSa Patient has ESRD secondary to HTN and DM, cannot exclude HIV associated nephropathy. Missed HD on 9/8 due to ED visit. Nephrology consulted for inpatient admission. -IP HD per nephrology, last HD 9/10 -heparin per neprhology -phoslo, rocaltrol, renal vitamin restarted  Insulin Dependent T2DM  Diabetic peripheral angiopathy Diabetic retinopathy  Gastroparesis Patient on 10u Lantus qHS at home. Patient to continue on 8u LA (given recent low sugars), gabapentin '100mg'$  TID. Plan to discharge on home dose LA 10u. -SSI, monitor CBGs  HTN HLD Patient hypertensive too [id number] systolic on arrival in the setting of acute wound infection and pain. Amlodipine increased to '10mg'$  daily  due to persistently elevated Bps. -Continue on amlodipine '10mg'$  daily, and lipitor '40mg'$  daily -Labetalol '10mg'$  IV and Hydralazine '5mg'$  IV PRN   HIV Follows with Dr. Megan Salon, last visit was video visit 10/10/2020. HIV infection has been under excellent long term  control. Patient continued on Biktarvy 50-200 daily. Will need to follow up with Dr. Megan Salon and ID, was supposed to have follow up appointment 02/05/21 for lab work but no showed the appt. Was also set up for behavior health counseling though they called in June and were unable to reach her since no voicemail box was set up.   Depression Patient has history of Depression and Anxiety. Patient was continued on the following home medications: Zoloft '50mg'$  qhs. No further medical management required.   GERD Patient continued on Protonix '40mg'$  daily  Diet: renal carb modified VTE: heparin per nephrology IVF: none Code: Full  Prior to Admission Living Arrangement: Home Anticipated Discharge Location: Home Barriers to Discharge: pending clinical improvement Dispo: Anticipated discharge in approximately 1 day(s).   Wayland Denis, MD 04/21/2021, 1:12 PM Pager: [phone] After 5pm on weekdays and 1pm on weekends: On Call pager [phone]

## 2021-04-21 NOTE — Consult Note (Signed)
[place] Nurse Consult Note: Reason for Consult: change NPWT dressing Dressing removed by VVS PA this am, see image and notes Wound type: surgical  Pressure Injury POA: NA Measurement: Right lateral 7cm x 3.5cm x 0.3cm; 20% fibrinous/80% pink granulation Left lateral 5cm x 2cm x 0.3cm x 100% pink granulation Wound bed: see above  Drainage (amount, consistency, odor) bloody on gauze placed by VVS this am;strike through on gauze and bed linen Periwound: macerated  Dressing procedure/placement/frequency: VAC drape used to place between the two open wounds to protect periwound skin 1pc of black foam used to fill both wound beds, seal obtained at 146mHG. Patient received PO pain meds prior to my arrival Patient tolerated well M/W/F VAC dressing changes per [place]

## 2021-04-22 ENCOUNTER — Other Ambulatory Visit (HOSPITAL_COMMUNITY): Payer: Self-pay

## 2021-04-22 LAB — CBC
HCT: 25.1 % — ABNORMAL LOW (ref 36.0–46.0)
Hemoglobin: 8.1 g/dL — ABNORMAL LOW (ref 12.0–15.0)
MCH: 30.1 pg (ref 26.0–34.0)
MCHC: 32.3 g/dL (ref 30.0–36.0)
MCV: 93.3 fL (ref 80.0–100.0)
Platelets: 333 10*3/uL (ref 150–400)
RBC: 2.69 MIL/uL — ABNORMAL LOW (ref 3.87–5.11)
RDW: 16.7 % — ABNORMAL HIGH (ref 11.5–15.5)
WBC: 6.3 10*3/uL (ref 4.0–10.5)
nRBC: 0 % (ref 0.0–0.2)

## 2021-04-22 LAB — RENAL FUNCTION PANEL
Albumin: 2.3 g/dL — ABNORMAL LOW (ref 3.5–5.0)
Anion gap: 13 (ref 5–15)
BUN: 42 mg/dL — ABNORMAL HIGH (ref 6–20)
CO2: 27 mmol/L (ref 22–32)
Calcium: 8.8 mg/dL — ABNORMAL LOW (ref 8.9–10.3)
Chloride: 91 mmol/L — ABNORMAL LOW (ref 98–111)
Creatinine, Ser: 9.27 mg/dL — ABNORMAL HIGH (ref 0.44–1.00)
GFR, Estimated: 5 mL/min — ABNORMAL LOW (ref >60)
Glucose, Bld: 197 mg/dL — ABNORMAL HIGH (ref 70–99)
Phosphorus: 5.3 mg/dL — ABNORMAL HIGH (ref 2.5–4.6)
Potassium: 4.6 mmol/L (ref 3.5–5.1)
Sodium: 131 mmol/L — ABNORMAL LOW (ref 135–145)

## 2021-04-22 LAB — GLUCOSE, CAPILLARY
Glucose-Capillary: 208 mg/dL — ABNORMAL HIGH (ref 70–99)
Glucose-Capillary: 199 mg/dL — ABNORMAL HIGH (ref 70–99)

## 2021-04-22 LAB — HEPATITIS B SURFACE ANTIBODY, QUANTITATIVE: Hep B S AB Quant (Post): 115.1 m[IU]/mL (ref >9.9)

## 2021-04-22 LAB — FERRITIN: Ferritin: 463 ng/mL — ABNORMAL HIGH (ref 11–307)

## 2021-04-22 LAB — IRON AND TIBC
Iron: 46 ug/dL (ref 28–170)
Saturation Ratios: 21 % (ref 10.4–31.8)
TIBC: 223 ug/dL — ABNORMAL LOW (ref 250–450)
UIBC: 177 ug/dL

## 2021-04-22 LAB — FOLATE: Folate: 13.2 ng/mL (ref >5.9)

## 2021-04-22 LAB — VITAMIN B12: Vitamin B-12: 644 pg/mL (ref 180–914)

## 2021-04-22 MED ORDER — INSULIN GLARGINE-YFGN 100 UNIT/ML ~~LOC~~ SOLN
10.0000 [IU] | Freq: Every day | SUBCUTANEOUS | Status: DC
  Filled 2021-04-22: qty 0.1

## 2021-04-22 MED ORDER — AMLODIPINE BESYLATE 10 MG PO TABS
10.0000 mg | ORAL_TABLET | Freq: Every day | ORAL | 0 refills | Status: DC
  Filled 2021-04-22: qty 30, 30d supply, fill #0

## 2021-04-22 MED ORDER — CYCLOBENZAPRINE HCL 5 MG PO TABS
5.0000 mg | ORAL_TABLET | Freq: Three times a day (TID) | ORAL | 0 refills | Status: AC
  Filled 2021-04-22: qty 15, 5d supply, fill #0

## 2021-04-22 MED ORDER — HYDRALAZINE HCL 25 MG PO TABS
25.0000 mg | ORAL_TABLET | Freq: Three times a day (TID) | ORAL | 0 refills | Status: DC
Start: 2021-04-22 — End: 2021-05-02
  Filled 2021-04-22: qty 50, 30d supply, fill #0

## 2021-04-22 MED ORDER — CYCLOBENZAPRINE HCL 5 MG PO TABS
5.0000 mg | ORAL_TABLET | Freq: Three times a day (TID) | ORAL | Status: DC
  Administered 2021-04-22: 5 mg via ORAL
  Filled 2021-04-22 (×2): qty 1

## 2021-04-22 MED ORDER — HYDROMORPHONE HCL 2 MG PO TABS
2.0000 mg | ORAL_TABLET | Freq: Four times a day (QID) | ORAL | 0 refills | Status: AC | PRN
  Filled 2021-04-22: qty 28, 7d supply, fill #0

## 2021-04-22 NOTE — Procedures (Signed)
Seen and examined on dialysis.  Blood pressure 164/80 and HR 79.  Tolerating goal.  LUE AVF in use.   Claudia Desanctis, MD 04/22/2021  9:34 AM

## 2021-04-22 NOTE — Discharge Instructions (Signed)
You were hospitalized for wound dehiscence and right below the knee amputation revision and washout. Thank you for allowing [place] to be part of your care.   We arranged for you to follow up with:  -Vascular surgery on September 26 -Please also follow-up with [place] in our internal medicine clinic, phone number: [phone]  Please note these changes to your medications:   Please call our clinic if you have any questions or concerns, we may be able to help and keep you from a long and expensive emergency room wait. Our clinic and after hours phone number is [phone], the best time to call is Monday through Friday 9 am to 4 pm but there is always someone available 24/7 if you have an emergency. If you need medication refills please notify your pharmacy one week in advance and they will send [place] a request.

## 2021-04-22 NOTE — TOC Transition Note (Signed)
Transition of Care [facility]) - CM/SW Discharge Note   Patient Details  Name: Kathy Frank MRN: [id number] Date of Birth: 1975/08/14  Transition of [facility]) CM/SW Contact:  Cyndi Bender, RN Phone Number: 04/22/2021, 3:06 PM   Clinical Narrative:    Notified this am by advanced home care unable to except referral for [facility] needs. Per discussion with patient yesterday called made to [facility] to see if they can assist with home vac needs. Spoke to [place].  Update 1150 per lisa with [facility] they can do a start of care with home vac needs Monday 9/19. They have spoken to [facility] and they can do vac dressing in office on Friday.  Patient currently in dialysis. Have been notified by [facility] home wound vac approved and pending delivery.   Update 1345 patient returned from dialysis spoke to patient and son takes her to dialysis but is working Friday unable to transport. Check other options.   [facility] made to [facility] they can provide transportation for apt Friday needs to be schedule 24 to 48 hrs in advance. Patient to agreeable to this and has signed wavier. Wavier faxed back to [facility]. Patient prefers am apt time. Per lisa with [facility] requests patient be a apt prior to 11am. Call made back to [facility] spoke to [place] and scheduled ride to arrive before 11am.   Home wound vac has been delivered to room.  Final next level of care: [place] Barriers to Discharge: Barriers Resolved   Patient Goals and CMS Choice Patient states their goals for this hospitalization and ongoing recovery are:: return home CMS Medicare.gov Compare Post Acute Care list provided to:: Patient Choice offered to / list presented to : Patient  Discharge Placement             Home with [facility] RN          Discharge Plan and Services   Discharge Planning Services: CM Consult Post Acute Care Choice: Home Health          DME Arranged: Vac DME Agency: [facility] Date DME Agency  Contacted: 04/21/21 Time DME Agency Contacted: [phone] Representative spoke with at DME Agency: [place]: RN [facility] Agency: [place] Date [place]: 04/22/21 Time [place]: 1430 Representative spoke with at [place]: [place]) Interventions     Readmission Risk Interventions Readmission Risk Prevention Plan 04/22/2021 12/19/2018 10/29/2018  Transportation Screening Complete Complete Complete  Medication Review Press photographer) Complete Referral to Pharmacy Referral to Pharmacy  PCP or Specialist appointment within 3-5 days of discharge Complete Complete Not Complete  PCP/Specialist Appt Not Complete comments - - (No Data)  [place] or Home Care Consult Complete Complete Complete  SW Recovery Care/Counseling Consult Complete Complete -  Palliative Care Screening Not Applicable Not Applicable Not [place] Not Applicable Not Applicable Not Applicable  Some recent data might be hidden

## 2021-04-22 NOTE — Progress Notes (Addendum)
  Progress Note    04/22/2021 7:41 AM 4 Days Post-Op  Subjective:  R BKA pain   Vitals:   04/22/21 0444 04/22/21 0727  BP:  (!) 181/79  Pulse:  81  Resp: 11 15  Temp:  98.3 F (36.8 C)  SpO2:  96%    Physical Exam: Incisions:  wound vac in place R BKA with good seal   CBC    Component Value Date/Time   WBC 6.3 04/22/2021 0136   RBC 2.69 (L) 04/22/2021 0136   HGB 8.1 (L) 04/22/2021 0136   HGB 9.8 (L) 07/18/2018 1133   HCT 25.1 (L) 04/22/2021 0136   HCT 19.3 (L) 10/09/2018 1405   PLT 333 04/22/2021 0136   PLT 417 07/18/2018 1133   MCV 93.3 04/22/2021 0136   MCV 89 07/18/2018 1133   MCH 30.1 04/22/2021 0136   MCHC 32.3 04/22/2021 0136   RDW 16.7 (H) 04/22/2021 0136   RDW 12.9 07/18/2018 1133   LYMPHSABS 2.7 04/20/2021 0052   LYMPHSABS 1.6 12/23/2017 1558   MONOABS 0.6 04/20/2021 0052   EOSABS 0.5 04/20/2021 0052   EOSABS 0.2 12/23/2017 1558   BASOSABS 0.1 04/20/2021 0052   BASOSABS 0.1 12/23/2017 1558    BMET    Component Value Date/Time   NA 131 (L) 04/22/2021 0136   NA 138 12/23/2017 1558   K 4.6 04/22/2021 0136   CL 91 (L) 04/22/2021 0136   CO2 27 04/22/2021 0136   GLUCOSE 197 (H) 04/22/2021 0136   BUN 42 (H) 04/22/2021 0136   BUN 28 (H) 12/23/2017 1558   CREATININE 9.27 (H) 04/22/2021 0136   CREATININE 12.07 (H) 05/29/2020 1155   CALCIUM 8.8 (L) 04/22/2021 0136   CALCIUM 8.5 (L) 11/25/2018 0309   GFRNONAA 5 (L) 04/22/2021 0136   GFRNONAA 79 11/14/2014 1154   GFRAA 5 (L) 01/06/2020 1329   GFRAA >89 11/14/2014 1154    INR    Component Value Date/Time   INR 1.0 03/28/2021 0546     Intake/Output Summary (Last 24 hours) at 04/22/2021 0741 Last data filed at 04/22/2021 0407 Gross per 24 hour  Intake 550 ml  Output 0 ml  Net 550 ml     Assessment/Plan:  45 y.o. female is s/p right below knee amputation debridement  4 Days Post-Op  - Continue wound vac R BKA with MWF changes - TOC working on home vac - [place] for d/c when home vac approved  and when patient no longer requiring IV pain medication   Dagoberto Ligas, PA-C [facility] [phone] 04/22/2021 7:41 AM  VASCULAR STAFF ADDENDUM: I agree with the above.   [name]. Stanford Breed, MD [facility] Phone Number: [phone] 04/22/2021 8:07 AM

## 2021-04-22 NOTE — Progress Notes (Addendum)
[facility] Progress Note   Subjective:    Seen and examined patient during HD. Tolerating UFG 2.5-3L. No complaints. Denies SOB and CP.  Objective Vitals:   04/22/21 1030 04/22/21 1100 04/22/21 1130 04/22/21 1200  BP: (!) 145/59 (!) 142/62 (!) 160/87 (!) 165/61  Pulse:      Resp: 12 (!) '22 11 11  '$ Temp:      TempSrc:      SpO2:      Weight:      Height:       Physical Exam General: Chronically-ill appearing; NAD Heart: S1 and S2; Breathing unlabored; on RA Lungs: Clear throughout; No wheezing, rales, and rhonchi Abdomen: Soft and non-tender; active bowel sounds Extremities: L AKA; R BKA-ace wrap in place Dialysis Access: L AVF (+) Bruit/Thrill   Filed Weights   04/21/21 0328 04/22/21 0444 04/22/21 0905  Weight: 73.3 kg 74.8 kg 73.2 kg    Intake/Output Summary (Last 24 hours) at 04/22/2021 1252 Last data filed at 04/22/2021 0407 Gross per 24 hour  Intake 550 ml  Output 0 ml  Net 550 ml    Additional Objective Labs: Basic Metabolic Panel: Recent Labs  Lab 04/19/21 1653 04/20/21 0052 04/21/21 0205 04/22/21 0136  NA  --  136 133* 131*  K  --  3.8 4.4 4.6  CL  --  96* 94* 91*  CO2  --  '27 25 27  '$ GLUCOSE  --  53* 232* 197*  BUN  --  23* 35* 42*  CREATININE  --  5.87* 8.12* 9.27*  CALCIUM  --  8.6* 8.7* 8.8*  PHOS 8.5*  --   --  5.3*   Liver Function Tests: Recent Labs  Lab 04/22/21 0136  ALBUMIN 2.3*   No results for input(s): LIPASE, AMYLASE in the last 168 hours. CBC: Recent Labs  Lab 04/17/21 1240 04/18/21 1840 04/19/21 0102 04/20/21 0052 04/21/21 0205 04/22/21 0136  WBC 5.7 12.3* 9.2 8.5 5.9 6.3  NEUTROABS 3.9  --   --  4.7  --   --   HGB 10.0* 10.0* 9.1* 8.8* 8.2* 8.1*  HCT 32.0* 31.2* 28.4* 27.6* 26.0* 25.1*  MCV 96.7 95.1 94.0 94.2 93.9 93.3  PLT 439* 425* 371 394 343 333   Blood Culture    Component Value Date/Time   SDES BLOOD RIGHT ANTECUBITAL 01/28/2021 1812   SPECREQUEST  01/28/2021 1812    BOTTLES DRAWN  AEROBIC AND ANAEROBIC Blood Culture adequate volume   CULT  01/28/2021 1812    NO GROWTH 5 DAYS Performed at [facility], [place] [address], [place],  [address]    REPTSTATUS 02/02/2021 FINAL 01/28/2021 1812    Cardiac Enzymes: No results for input(s): CKTOTAL, CKMB, CKMBINDEX, TROPONINI in the last 168 hours. CBG: Recent Labs  Lab 04/21/21 0619 04/21/21 1214 04/21/21 1717 04/21/21 2128 04/22/21 0612  GLUCAP 199* 188* 179* 269* 208*   Iron Studies:  Recent Labs    04/22/21 0706  IRON 46  TIBC 223*  FERRITIN 463*   Lab Results  Component Value Date   INR 1.0 03/28/2021   INR 1.3 (H) 01/28/2021   INR 1.1 05/03/2019   Studies/Results: No results found.  Medications:  sodium chloride     sodium chloride 20 mL/hr at 04/21/21 2131    amLODipine  10 mg Oral Daily   aspirin EC  81 mg Oral Daily   atorvastatin  40 mg Oral Daily   bictegravir-emtricitabine-tenofovir AF  1 tablet Oral Daily  calcitRIOL  0.25 mcg Oral Q T,Th,Sa-HD   calcium acetate  2,001 mg Oral TID WC   Chlorhexidine Gluconate Cloth  6 each Topical Q0600   clopidogrel  75 mg Oral Daily   cyclobenzaprine  5 mg Oral TID   darbepoetin (ARANESP) injection - DIALYSIS  60 mcg Intravenous Q Tue-HD   gabapentin  100 mg Oral Once per day on Sun Mon Wed Fri   heparin  5,000 Units Subcutaneous Q8H   hydrALAZINE  25 mg Oral Q8H   influenza vac split quadrivalent PF  0.5 mL Intramuscular Once   insulin aspart  0-9 Units Subcutaneous TID WC   insulin glargine-yfgn  10 Units Subcutaneous QHS   multivitamin  1 tablet Oral Daily   pantoprazole  40 mg Oral Daily   polyethylene glycol  17 g Oral Daily   senna  1 tablet Oral Daily   sertraline  50 mg Oral QHS    Dialysis Orders: TTS - [facility] 4hrs, BFR 400, DFR AF 1.5,  EDW 68.5kg, 2K/ 2Ca   Access: LU AVF  Heparin 3000 unit bolus Mircera 150 mcg q2wks - last 8/11 Venofer '50mg'$  qwk Calcitriol 0.25mg PO qHD  Assessment/Plan: R BKA dehiscence - s/p  revision and wound vac placement by Dr. HStanford Breedon 04/18/21.  PO pain meds added by vascular.  ESRD -  on HD TTS. HD yesterday tolerated well. HD today. Tolerated UFG 2.5-3L. Hypertension/volume  - BP variable.  Continue home meds.  Not quite to EDW here but was reaching at outpatient. Continue fluid restrictions and UF to dry as tolerated.  Anemia of CKD - Hgb drop-now 8.1 post surgery. Aranesp scheduled to be given with HD today.   Secondary Hyperparathyroidism -  Ca ok. Phos now at goal, continue binders and VDRA. Nutrition - Renal diet w/fluid restrictions.  DMT2 - per PMD  CTobie Poet NP [facility] 04/22/2021,12:52 PM  LOS: 5 days     Seen and examined independently.  Agree with note and exam as documented above by physician extender and as noted here.  See HD note from today.   LClaudia Desanctis MD 04/22/2021  4:05 PM

## 2021-04-22 NOTE — Discharge Summary (Signed)
Name: Kathy Frank MRN: [id number] DOB: 01-Jul-1976 45 y.o. PCP: [name], MD  Date of Admission: 04/17/2021 12:03 PM Date of Discharge: 04/22/2021 Attending Physician: Velna Ochs, MD  Discharge Diagnosis: 1. Wound Dehiscence 2.  R BKA revision and washout  Discharge Medications: Allergies as of 04/22/2021   No Known Allergies      Medication List     STOP taking these medications    amoxicillin 250 MG capsule Commonly known as: AMOXIL   oxyCODONE 5 MG immediate release tablet Commonly known as: Oxy IR/ROXICODONE   oxyCODONE-acetaminophen 5-325 MG tablet Commonly known as: PERCOCET/ROXICET       TAKE these medications    amLODipine 10 MG tablet Commonly known as: NORVASC Take 1 tablet (10 mg total) by mouth daily. Start taking on: April 23, 2021 What changed:  medication strength how much to take   aspirin EC 81 MG tablet Take 1 tablet (81 mg total) by mouth daily. Swallow whole.   atorvastatin 40 MG tablet Commonly known as: Lipitor Take 1 tablet (40 mg total) by mouth daily.   Biktarvy 50-200-25 MG Tabs tablet Generic drug: bictegravir-emtricitabine-tenofovir AF TAKE 1 TABLET BY MOUTH DAILY.   calcitRIOL 0.25 MCG capsule Commonly known as: ROCALTROL Take 1 capsule (0.25 mcg total) by mouth Every Tuesday,Thursday,and Saturday with dialysis.   calcium acetate 667 MG capsule Commonly known as: PHOSLO Take 3 capsules (2,001 mg total) by mouth 3 (three) times daily with meals.   clopidogrel 75 MG tablet Commonly known as: PLAVIX Take 1 tablet (75 mg total) by mouth daily.   cyclobenzaprine 5 MG tablet Commonly known as: FLEXERIL Take 1 tablet (5 mg total) by mouth 3 (three) times daily for 5 days.   gabapentin 100 MG capsule Commonly known as: NEURONTIN Take 1 capsule (100 mg total) by mouth 3 (three) times a week. What changed:  how much to take when to take this additional instructions   hydrALAZINE 25 MG tablet Commonly  known as: APRESOLINE Take 1 tablet (25 mg total) by mouth 3 (three) times daily. Non Hemodialysis days: MWFSun   HYDROmorphone 2 MG tablet Commonly known as: DILAUDID Take 1 tablet (2 mg total) by mouth every 6 (six) hours as needed for up to 7 days for severe pain or moderate pain.   Lantus SoloStar 100 UNIT/ML Solostar Pen Generic drug: insulin glargine Inject 8 Units into the skin at bedtime. What changed: how much to take   multivitamin Tabs tablet Take 1 tablet by mouth daily.   pantoprazole 40 MG tablet Commonly known as: PROTONIX Take 1 tablet (40 mg total) by mouth daily.   Pentips 32G X 4 MM Misc Generic drug: Insulin Pen Needle Use as directed   polyethylene glycol 17 g packet Commonly known as: MIRALAX / GLYCOLAX Take 17 g by mouth 2 (two) times daily. What changed:  when to take this reasons to take this   ProAir HFA 108 (90 Base) MCG/ACT inhaler Generic drug: albuterol Inhale 2 puffs into the lungs every 4 (four) hours as needed for wheezing or shortness of breath.   sertraline 50 MG tablet Commonly known as: Zoloft Take 1 tablet (50 mg total) by mouth at bedtime.               Discharge Care Instructions  (From admission, onward)           Start     Ordered   04/22/21 0000  Discharge wound care:       Comments:  Wound care per wound care consultant recs   04/22/21 1535            Disposition and follow-up:   Kathy Frank was discharged from [facility] in Stable condition.  At the hospital follow up visit please address:  1.  Wound Dehiscence 2/2 R BKA revision and washout: Patient will follow up with vascular surgery 9/26 for staple removal.  Poorly controlled type 2 diabetes mellitus: We will need follow-up with PCP for chronic blood glucose management HTN: Will need follow-up with PCP for chronic blood pressure management.  Patient was started on hydralazine 25 mg TID HIV: Needs follow-up with Dr. Megan Salon  infectious disease.  Ordered HIV RNA quant and CD4 count which are pending.  2.  Labs / imaging needed at time of follow-up: renal function panel, blood glucose, CBC  3.  Pending labs/ test needing follow-up: HIV RNA quant, CD4 count  Follow-up Appointments:  Follow-up Information     [facility] Follow up.   Why: Appointment on 9/26 at 3:30pm for staple removal Contact information: [place]        [name], MD Follow up.   Specialty: Internal Medicine Why: You will receive a call to set up an appointment.  If you do not hear from the [facility] health internal medicine clinic please call: [phone] Contact information: [place] [address] [phone]         [facility] Follow up.   Specialty: Home Health Services Why: [facility] RN arranged for wound vac dressing needs. Start of care 9/19 (home wound vac arranged with [facility]) Contact information: [place] [phone]         [facility] Follow up.   Why: They will contact you on Thursday regarding ride to apt on Friday. you will need to confirm ride. ( Contact information: [phone] You may call for future needs to [facility] facilities/offices              Vascular surgery 9/26 Primary care physician Infectious disease  Hospital Course by problem list:  Kathy Frank is a 45 yo african [place] female with PMHx of HIV, HTN, HLD, ESRD on HD, Insulin dependent T2DM, peripheral vascular disease, bilateral lower extremity amputations, most recently 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 Trula Slade) due to infection, who presented to [facility] ED on 9/8 for bleeding and wound dehiscence at the R LE amputation site after a fall at home and was admitted for R BKA revision and washout for wound dehiscence and possible surgical wound infection.  R BKA Wound Dehiscence s/p revision and  washout Possible Surgical Wound Infection PVD Patient had fall at home on her right leg amputation site causing wound dehiscence.  Originally there was concern for possible wound infection. Patient POD 4 from R BKA revision/washout (Dr. Stanford Breed 04/18/21). Per op note, there was minimal infection, no large pocket. Discussed case with Dr. Stanford Breed on 9/10 and he felt that was reasonable to discontinue antibiotics, low concern for infection.  Patient has remained afebrile without leukocytosis since discontinuing antibiotics. Periosteal reaction on xray could be secondary to recent revision on 8/19 rather than OM. Pain adequately managed on regimen of IV and p.o. Dilaudid. Added on low dose flexeril for spasms 9/12. Wound vac draining serosanguinous fluid.  Patient was continued on home aspirin 81 mg, Plavix 75 mg daily. Patient is tolerating PO intake, having  BMs, and requiring less pain medicine. HHRN was arranged and patient was authorized for home wound vac and x3 weekly wound vac changes. Patient has appointment scheduled with vascular surgery for follow-up on 9/26 for staple removal.   ESRD on HD TThSa Patient has ESRD secondary to HTN and DM, cannot exclude HIV associated nephropathy. Missed HD on 9/8 due to ED visit. Nephrology was consulted for inpatient admission.  She was restarted on phoslo, rocaltrol, renal vitamin. Last hemodialysis 9/13. Patient will continue outpatient hemodialysis on her regular schedule.   Insulin Dependent T2DM  Diabetic peripheral angiopathy Diabetic retinopathy  Gastroparesis Patient has history of poorly controlled type 2 diabetes . Patient on 10u Lantus qHS at home.  Patient was started on SSI and long-acting dose 10 units. Patient had intermittently low blood glucose and LA was decreased to 8 units during admission. Gabapentin '100mg'$  TID was continued. Plan to discharge on home dose LA 10u.   HTN HLD Patient was hypertensive to [id number] systolic on arrival in the setting of  possible infection and pain. Amlodipine increased to '10mg'$  daily due to persistently elevated Bps. She was also started on hydralazine 25 mg q8h With improvement in blood pressures. She was continued on Lipitor 40 mg daily.  No further medical management was required.  Will need to follow-up with her primary care physician for long-term blood pressure control.    HIV Follows with Dr. Megan Salon, last visit was video visit 10/10/2020. HIV infection has been under excellent long term control. Patient continued on Biktarvy 50-200 daily. Will need to follow up with Dr. Megan Salon and ID, was supposed to have follow up appointment 02/05/21 for lab work but no showed the appt. Was also set up for behavior health counseling though they called in June and were unable to reach her since no voicemail box was set up.  HIV RNA quant and CD4 count ordered, will need follow-up   Depression Patient has history of Depression and Anxiety. Patient was continued on the following home medications: Zoloft '50mg'$  qhs. No further medical management required.   GERD Patient has a history of GERD and takes Protonix 40 mg daily. Patient continued on Protonix this admission.  No further medical management required.  Subjective on day of discharge: No acute events or concerns overnight.  Patient seen on rounds this morning.  Patient feels well.  Patient is still having spasms of right lower extremity. Otherwise her pain is well managed last bowel movement this morning. Is eating well without nausea/vomiting.  No additional complaints.  Discharge Exam:   BP (!) 174/82   Pulse 81   Temp 98.4 F (36.9 C) (Oral)   Resp 17   Ht '5\' 5"'$  (1.651 m)   Wt 69.3 kg   LMP 10/23/2020 (Approximate)   SpO2 100%   BMI 25.42 kg/m  Physical Exam: General: Well appearing african [place] female, NAD HENT: normocephalic, atraumatic EYES: conjunctiva non-erythematous, no scleral icterus CV: regular rate, normal rhythm, no murmurs, rubs,  gallops. Pulmonary: sating well on RA, lung clear to auscultation, no rales, wheezes, rhonchi Abdominal: non-distended, soft, non-tender to palpation, normal BS Skin: Warm and dry, no rashes or lesions MSK: bilateral leg amputations, R BKA wound vac in place with minimal serosanguinous fluid draining Neurological: MS: awake, alert and oriented x3, normal speech and fund of knowledge Motor: moves all extremities antigravity Psych: normal affect  Pertinent Labs, Studies, and Procedures:  CBC Latest Ref Rng & Units 04/22/2021 04/21/2021 04/20/2021  WBC 4.0 - 10.5 K/uL 6.3  5.9 8.5  Hemoglobin 12.0 - 15.0 g/dL 8.1(L) 8.2(L) 8.8(L)  Hematocrit 36.0 - 46.0 % 25.1(L) 26.0(L) 27.6(L)  Platelets 150 - 400 K/uL 333 343 394   BMP Latest Ref Rng & Units 04/22/2021 04/21/2021 04/20/2021  Glucose 70 - 99 mg/dL 197(H) 232(H) 53(L)  BUN 6 - 20 mg/dL 42(H) 35(H) 23(H)  Creatinine 0.44 - 1.00 mg/dL 9.27(H) 8.12(H) 5.87(H)  BUN/Creat Ratio 6 - 22 (calc) - - -  Sodium 135 - 145 mmol/L 131(L) 133(L) 136  Potassium 3.5 - 5.1 mmol/L 4.6 4.4 3.8  Chloride 98 - 111 mmol/L 91(L) 94(L) 96(L)  CO2 22 - 32 mmol/L '27 25 27  '$ Calcium 8.9 - 10.3 mg/dL 8.8(L) 8.7(L) 8.6(L)   Iron and TIBC [id number] (Abnormal) Collected: 04/22/21 0706  Specimen: Blood Updated: 04/22/21 0817   Iron 46 ug/dL    TIBC 223 Low  ug/dL    Saturation Ratios 21 %    UIBC 177 ug/dL   Ferritin [id number] (Abnormal) Collected: 04/22/21 0706  Specimen: Blood Updated: 04/22/21 0817   Ferritin 463 High  ng/mL    Hepatitis B surface antibody [id number] (Abnormal) Collected: 04/21/21 1442  Specimen: Blood Updated: 04/21/21 2049   Hep B S Ab Reactive Abnormal    Hepatitis B surface antigen [id number] Collected: 04/21/21 1442  Specimen: Blood Updated: 04/21/21 1635   Hepatitis B Surface Ag NON REACTIVE   Discharge Instructions: Discharge Instructions     Call MD for:  persistant nausea and vomiting   Complete by: As directed    Call MD  for:  redness, tenderness, or signs of infection (pain, swelling, redness, odor or green/yellow discharge around incision site)   Complete by: As directed    Call MD for:  severe uncontrolled pain   Complete by: As directed    Call MD for:  temperature >100.4   Complete by: As directed    Discharge wound care:   Complete by: As directed    Wound care per wound care consultant recs       Signed: Wayland Denis, MD 04/22/2021, 3:37 PM   Pager: [phone]

## 2021-04-22 NOTE — Plan of Care (Signed)
Plan of care are reviewed. Pt has been progressing.   Problem: Clinical Measurements: remains afebrile, right BKA on wound vac, no active drainage. Goal: Will remain free from infection Outcome: Progressing   Problem: Clinical Measurements: on room air, SPO2 100%, lungs clear auscultated, no respiratory distress. Goal: Respiratory complications will improve Outcome: Progressing   Problem: Clinical Measurements: NSR, BP elevated, Labetalol PRN given. Goal: Cardiovascular complication will be avoided Outcome: Progressing   Problem: Activity: amputation bilateral legs. Requires assistance with ambulation. Goal: Risk for activity intolerance will decrease Outcome: Progressing   Problem: Coping: Goal: Level of anxiety will decrease Outcome: Progressing   Problem: Elimination: Goal: Will not experience complications related to bowel motility Outcome: Progressing   Problem: Elimination: ESRD, hemodialysis on Tue, Thu, Sat Goal: Will not experience complications related to urinary retention Outcome: Progressing   Problem: Pain Managment: pain tolerated well. Goal: General experience of comfort will improve Outcome: Progressing   Kennyth Lose, RN

## 2021-04-23 LAB — HIV-1 RNA QUANT-NO REFLEX-BLD
HIV 1 RNA Quant: <20 copies/mL
LOG10 HIV-1 RNA: UNDETERMINED log10copy/mL

## 2021-04-23 LAB — RPR: RPR Ser Ql: NONREACTIVE

## 2021-04-24 ENCOUNTER — Telehealth: Payer: Self-pay | Admitting: Student

## 2021-04-24 NOTE — Telephone Encounter (Signed)
TOC HFU appointment 05/02/2021 at 10:15 am with Dr. Raymondo Band.  Unable to contact patient via telephone. Voicemail not setup.  Appointment letter mailed.

## 2021-04-24 NOTE — Telephone Encounter (Signed)
-----   Message from Rick Duff, MD sent at 04/22/2021  4:06 PM EDT ----- Hello! Is it possible to schedule this patient for a hospital follow up in 1-2 weeks.   Thank yoU!

## 2021-04-24 NOTE — TOC Transition Note (Signed)
Transition of care contact from inpatient facility  Date of discharge: 04/22/21 Date of contact: 04/24/21 Method: Phone Spoke to: Patient  Patient contacted to discuss transition of care from recent inpatient hospitalization. Patient was admitted to [facility] from 04/17/21-04/22/21 with discharge diagnosis of wound dehiscence and R BKA revision and washout (patient fell at home).  Medication changes were reviewed.  Patient will follow up with her outpatient HD unit on: 04/26/21 at [facility].  Tobie Poet, NP

## 2021-04-25 ENCOUNTER — Telehealth: Payer: Self-pay

## 2021-04-25 NOTE — Telephone Encounter (Signed)
Patient calls today to ask about her appt today. She does not have an appointment scheduled. Her d/c papers show an appt on 9/26, but that [facility] transport would pick her on Friday. She has not had a wound vac change since Monday. Talked to [facility], they are going out to see patient on Monday. Patient does not have a ride to get here this afternoon. She says the wound area looks good, the seal is intact and the cannister only has a little drainage in it. Advised patient that if she had difficulty with wound vac this weekend, she could either take her supplies to an urgent care or [facility] to have them change it, or remove vac and keep area clean and protected until [facility] comes to see her Monday. Patient verbalizes understanding.

## 2021-04-26 DIAGNOSIS — N186 End stage renal disease: Secondary | ICD-10-CM | POA: Diagnosis not present

## 2021-04-26 DIAGNOSIS — D631 Anemia in chronic kidney disease: Secondary | ICD-10-CM | POA: Diagnosis not present

## 2021-04-26 DIAGNOSIS — D689 Coagulation defect, unspecified: Secondary | ICD-10-CM | POA: Diagnosis not present

## 2021-04-26 DIAGNOSIS — D509 Iron deficiency anemia, unspecified: Secondary | ICD-10-CM | POA: Diagnosis not present

## 2021-04-26 DIAGNOSIS — L299 Pruritus, unspecified: Secondary | ICD-10-CM | POA: Diagnosis not present

## 2021-04-26 DIAGNOSIS — Z992 Dependence on renal dialysis: Secondary | ICD-10-CM | POA: Diagnosis not present

## 2021-04-26 DIAGNOSIS — N2581 Secondary hyperparathyroidism of renal origin: Secondary | ICD-10-CM | POA: Diagnosis not present

## 2021-04-28 ENCOUNTER — Other Ambulatory Visit (HOSPITAL_COMMUNITY): Payer: Self-pay

## 2021-04-28 ENCOUNTER — Other Ambulatory Visit: Payer: Self-pay | Admitting: Internal Medicine

## 2021-04-28 DIAGNOSIS — Z89511 Acquired absence of right leg below knee: Secondary | ICD-10-CM | POA: Diagnosis not present

## 2021-04-28 DIAGNOSIS — K219 Gastro-esophageal reflux disease without esophagitis: Secondary | ICD-10-CM | POA: Diagnosis not present

## 2021-04-28 DIAGNOSIS — E1165 Type 2 diabetes mellitus with hyperglycemia: Secondary | ICD-10-CM | POA: Diagnosis not present

## 2021-04-28 DIAGNOSIS — B20 Human immunodeficiency virus [HIV] disease: Secondary | ICD-10-CM

## 2021-04-28 DIAGNOSIS — I10 Essential (primary) hypertension: Secondary | ICD-10-CM | POA: Diagnosis not present

## 2021-04-28 DIAGNOSIS — I739 Peripheral vascular disease, unspecified: Secondary | ICD-10-CM | POA: Diagnosis not present

## 2021-04-28 DIAGNOSIS — N185 Chronic kidney disease, stage 5: Secondary | ICD-10-CM | POA: Diagnosis not present

## 2021-04-28 DIAGNOSIS — E785 Hyperlipidemia, unspecified: Secondary | ICD-10-CM | POA: Diagnosis not present

## 2021-04-28 DIAGNOSIS — Z794 Long term (current) use of insulin: Secondary | ICD-10-CM | POA: Diagnosis not present

## 2021-04-28 DIAGNOSIS — T8781 Dehiscence of amputation stump: Secondary | ICD-10-CM | POA: Diagnosis not present

## 2021-04-28 MED ORDER — BIKTARVY 50-200-25 MG PO TABS
1.0000 | ORAL_TABLET | Freq: Every day | ORAL | 0 refills | Status: DC
  Filled 2021-04-28: qty 30, 30d supply, fill #0

## 2021-04-29 ENCOUNTER — Other Ambulatory Visit (HOSPITAL_COMMUNITY): Payer: Self-pay

## 2021-04-29 ENCOUNTER — Telehealth: Payer: Self-pay

## 2021-04-29 DIAGNOSIS — Z992 Dependence on renal dialysis: Secondary | ICD-10-CM | POA: Diagnosis not present

## 2021-04-29 DIAGNOSIS — D631 Anemia in chronic kidney disease: Secondary | ICD-10-CM | POA: Diagnosis not present

## 2021-04-29 DIAGNOSIS — N186 End stage renal disease: Secondary | ICD-10-CM | POA: Diagnosis not present

## 2021-04-29 DIAGNOSIS — L299 Pruritus, unspecified: Secondary | ICD-10-CM | POA: Diagnosis not present

## 2021-04-29 DIAGNOSIS — D689 Coagulation defect, unspecified: Secondary | ICD-10-CM | POA: Diagnosis not present

## 2021-04-29 DIAGNOSIS — N2581 Secondary hyperparathyroidism of renal origin: Secondary | ICD-10-CM | POA: Diagnosis not present

## 2021-04-29 DIAGNOSIS — D509 Iron deficiency anemia, unspecified: Secondary | ICD-10-CM | POA: Diagnosis not present

## 2021-04-30 ENCOUNTER — Encounter: Payer: Medicare Other | Admitting: Internal Medicine

## 2021-04-30 DIAGNOSIS — N185 Chronic kidney disease, stage 5: Secondary | ICD-10-CM | POA: Diagnosis not present

## 2021-04-30 DIAGNOSIS — K219 Gastro-esophageal reflux disease without esophagitis: Secondary | ICD-10-CM | POA: Diagnosis not present

## 2021-04-30 DIAGNOSIS — I739 Peripheral vascular disease, unspecified: Secondary | ICD-10-CM | POA: Diagnosis not present

## 2021-04-30 DIAGNOSIS — T8189XA Other complications of procedures, not elsewhere classified, initial encounter: Secondary | ICD-10-CM | POA: Diagnosis not present

## 2021-04-30 DIAGNOSIS — E785 Hyperlipidemia, unspecified: Secondary | ICD-10-CM | POA: Diagnosis not present

## 2021-04-30 DIAGNOSIS — T8781 Dehiscence of amputation stump: Secondary | ICD-10-CM | POA: Diagnosis not present

## 2021-04-30 DIAGNOSIS — Z89511 Acquired absence of right leg below knee: Secondary | ICD-10-CM | POA: Diagnosis not present

## 2021-04-30 DIAGNOSIS — I10 Essential (primary) hypertension: Secondary | ICD-10-CM | POA: Diagnosis not present

## 2021-04-30 DIAGNOSIS — E1165 Type 2 diabetes mellitus with hyperglycemia: Secondary | ICD-10-CM | POA: Diagnosis not present

## 2021-04-30 DIAGNOSIS — Z794 Long term (current) use of insulin: Secondary | ICD-10-CM | POA: Diagnosis not present

## 2021-04-30 NOTE — Telephone Encounter (Signed)
Patient called in requesting refill on flexeril. States muscle spasms in right leg are "keeping me up all night." She has f/u appt on 9/23 but is requesting refill today at [facility].

## 2021-05-01 ENCOUNTER — Other Ambulatory Visit (HOSPITAL_COMMUNITY): Payer: Self-pay

## 2021-05-01 ENCOUNTER — Other Ambulatory Visit: Payer: Self-pay | Admitting: Internal Medicine

## 2021-05-01 DIAGNOSIS — N2581 Secondary hyperparathyroidism of renal origin: Secondary | ICD-10-CM | POA: Diagnosis not present

## 2021-05-01 DIAGNOSIS — D689 Coagulation defect, unspecified: Secondary | ICD-10-CM | POA: Diagnosis not present

## 2021-05-01 DIAGNOSIS — Z992 Dependence on renal dialysis: Secondary | ICD-10-CM | POA: Diagnosis not present

## 2021-05-01 DIAGNOSIS — L299 Pruritus, unspecified: Secondary | ICD-10-CM | POA: Diagnosis not present

## 2021-05-01 DIAGNOSIS — N186 End stage renal disease: Secondary | ICD-10-CM | POA: Diagnosis not present

## 2021-05-01 DIAGNOSIS — D509 Iron deficiency anemia, unspecified: Secondary | ICD-10-CM | POA: Diagnosis not present

## 2021-05-01 DIAGNOSIS — D631 Anemia in chronic kidney disease: Secondary | ICD-10-CM | POA: Diagnosis not present

## 2021-05-02 ENCOUNTER — Encounter: Payer: Self-pay | Admitting: Internal Medicine

## 2021-05-02 ENCOUNTER — Telehealth: Payer: Self-pay

## 2021-05-02 ENCOUNTER — Ambulatory Visit (INDEPENDENT_AMBULATORY_CARE_PROVIDER_SITE_OTHER): Payer: Medicare Other | Admitting: Internal Medicine

## 2021-05-02 ENCOUNTER — Other Ambulatory Visit: Payer: Self-pay

## 2021-05-02 ENCOUNTER — Other Ambulatory Visit (HOSPITAL_COMMUNITY): Payer: Self-pay

## 2021-05-02 DIAGNOSIS — I10 Essential (primary) hypertension: Secondary | ICD-10-CM | POA: Diagnosis not present

## 2021-05-02 DIAGNOSIS — E1169 Type 2 diabetes mellitus with other specified complication: Secondary | ICD-10-CM | POA: Diagnosis not present

## 2021-05-02 DIAGNOSIS — E669 Obesity, unspecified: Secondary | ICD-10-CM | POA: Diagnosis not present

## 2021-05-02 DIAGNOSIS — Z89511 Acquired absence of right leg below knee: Secondary | ICD-10-CM

## 2021-05-02 MED ORDER — HYDRALAZINE HCL 50 MG PO TABS: 50.0000 mg | ORAL_TABLET | Freq: Three times a day (TID) | ORAL | 0 refills | Status: DC

## 2021-05-02 MED ORDER — CYCLOBENZAPRINE HCL 5 MG PO TABS: 5.0000 mg | ORAL_TABLET | Freq: Three times a day (TID) | ORAL | 0 refills | Status: AC | PRN

## 2021-05-02 MED ORDER — GLUCOSE BLOOD VI STRP: ORAL_STRIP | 12 refills | Status: AC

## 2021-05-02 MED ORDER — LIRAGLUTIDE 18 MG/3ML ~~LOC~~ SOPN: 0.6000 mg | PEN_INJECTOR | Freq: Every day | SUBCUTANEOUS | 0 refills | Status: DC

## 2021-05-02 MED ORDER — GABAPENTIN 100 MG PO CAPS: 100.0000 mg | ORAL_CAPSULE | ORAL | 2 refills | Status: DC

## 2021-05-02 MED ORDER — ACCU-CHEK SOFTCLIX LANCETS MISC: 12 refills | Status: AC

## 2021-05-02 NOTE — Patient Instructions (Signed)
Thank you, [name] for allowing [place] to provide your care today. Today we discussed: Right below the knee amputation revision: Follow up with the vascular surgeons on Monday, as scheduled. Sent in another short course of flexeril for you to take for the muscle spasms that you have been getting Blood pressure: Increase hydralazine to 50 mg three times a day on NON DIALYSIS days ( Monday, Wednesday, Friday, Sunday)- I have sent in a new prescription for this. Continue to take amlodipine 10 mg everyday Diabetes: Continue lantus 10 units each night and start taking Victoza 0.'6mg'$  injection each day. Please check your sugars with your new monitor and bring this in at your next visit in 1 month  I have ordered the following labs for you:  Lab Orders  No laboratory test(s) ordered today      Referrals ordered today:   Referral Orders  No referral(s) requested today     I have ordered the following medication/changed the following medications:   Stop the following medications: Medications Discontinued During This Encounter  Medication Reason   hydrALAZINE (APRESOLINE) 25 MG tablet    gabapentin (NEURONTIN) 100 MG capsule Reorder     Start the following medications: Meds ordered this encounter  Medications   gabapentin (NEURONTIN) 100 MG capsule    Sig: Take 1-3 capsules (100-300 mg total) by mouth See admin instructions. Take 100 mg on Monday, Wednesday, Friday and Sunday    Dispense:  30 capsule    Refill:  2   glucose blood test strip    Sig: Use as instructed    Dispense:  100 each    Refill:  12   liraglutide (VICTOZA) 18 MG/3ML SOPN    Sig: Inject 0.6 mg into the skin daily.    Dispense:  3 mL    Refill:  0   Accu-Chek Softclix Lancets lancets    Sig: Use as instructed    Dispense:  100 each    Refill:  12   cyclobenzaprine (FLEXERIL) 5 MG tablet    Sig: Take 1 tablet (5 mg total) by mouth 3 (three) times daily as needed for up to 5 days for muscle spasms.     Dispense:  15 tablet    Refill:  0   hydrALAZINE (APRESOLINE) 50 MG tablet    Sig: Take 1 tablet (50 mg total) by mouth 3 (three) times daily. Take 1 tablet ('50mg'$ ) 3 times daily on NON DIALYSIS DAYS (MWFSun)    Dispense:  90 tablet    Refill:  0     Follow up:  1 month     Remember: If you develop a headache, chest pain, palpitations, or trouble breathing, please call 911 and go to the nearest emergency department  Should you have any questions or concerns please call the internal medicine clinic at [phone].     Buddy Duty, D.O. [place]

## 2021-05-02 NOTE — Progress Notes (Signed)
   CC: hospital follow up  HPI:  [name] is a 45 y.o. female with HTN, PAD, T2DM, ESRD on HD, and HIV who presents to the [facility] as a hospital follow up after a R BKA revision and washout.   Past Medical History:  Diagnosis Date   Anal abscess    chronic   Anxiety    Depression 06/28/2006   Qualifier: Diagnosis of  By: Riccardo Dubin MD, [place]     Diabetes mellitus type 2 in obese ([place]) 06/28/1994   Dyspnea    uses oxygeb 2 liters per minute at dialysis   End stage renal disease on dialysis [facility])    Erosive esophagitis    Esophageal reflux    Eye redness    Gastroparesis    ? diabetic   GERD (gastroesophageal reflux disease)    History of blood transfusion 2019   Human immunodeficiency virus (HIV) disease ([place]) 04/23/2016   Hypertension    Metabolic bone disease [id number]   Moderate nonproliferative diabetic retinopathy of both eyes ([place]) 11/21/2014   11/14/14: Noted on retinal imaging; needs follow-up imaging in 6 months  05/22/16: Noted on retinal imaging again; needs follow-up imaging in 6 months   Type 2 diabetes mellitus with diabetic peripheral angiopathy without gangrene ([place]) 05/01/2019   Wears dentures    lower   Wound infection s/p L transmetatarsal amputation    Review of Systems:  Review of Systems  Constitutional:  Negative for chills and fever.  HENT:  Negative for congestion and sore throat.   Eyes:  Negative for blurred vision and double vision.  Respiratory:  Negative for cough and shortness of breath.   Cardiovascular:  Negative for chest pain and palpitations.  Gastrointestinal:  Negative for diarrhea, nausea and vomiting.  Genitourinary: Negative.   Musculoskeletal:  Positive for myalgias.  Neurological:  Negative for dizziness and headaches.    Physical Exam:  Vitals:   05/02/21 1026  BP: (!) 177/97  Pulse: 88  Resp: (!) 24  Temp: 99.2 F (37.3 C)  TempSrc: Oral  SpO2: 100%  Weight: 149 lb 14.6 oz (68 kg)  Height: '5\' 5"'$  (1.651 m)    General: No acute distress. CV: RRR. No murmurs, rubs, or gallops. Appears euvolemic. Pulmonary: Lungs CTAB. Normal effort. No wheezing or rales. Abdominal: Soft, nontender, nondistended. Normal bowel sounds. Extremities: s/p L AKA and R BKA. R BKA has wound vac in place with very minimal serosanguinous fluid output x 1 week and is wrapped in dressing Skin: Warm and dry. No obvious rash or lesions. Neuro: A&Ox3. Moves all extremities.  Psych: Normal mood and affect   Assessment & Plan:   See Encounters Tab for problem based charting.  Patient seen with Dr. Heber [place]

## 2021-05-02 NOTE — Progress Notes (Signed)
Internal Medicine Clinic Attending ° °I saw and evaluated the patient.  I personally confirmed the key portions of the history and exam documented by Dr. Atway and I reviewed pertinent patient test results.  The assessment, diagnosis, and plan were formulated together and I agree with the documentation in the resident’s note.  °

## 2021-05-02 NOTE — Assessment & Plan Note (Signed)
BP Readings from Last 3 Encounters:  05/02/21 (!) 177/97  04/22/21 (!) 174/82  04/02/21 (!) 149/79   BP is consistently elevated and the patient had her amlodipine 5 mg increased to 10 mg on discharge. She was also started on hydralazine 25 mg TID on non dialysis days (MWFSun) and reports that she has been adherent with this. The patient notes that her BP was also very elevated at dialysis yesterday. Today, the patient states that she feels well despite her elevated BP and denies any headache, dizziness, blurry vision, chest pain, palpitations, or any difficulty breathing.   Plan: - Continue amlodipine 10 mg daily - Increase hydralazine to 50 mg TID on nondialysis days - Recheck BP 1 month - Advised patient to call 911 and go to nearest emergency room if she develops a headache, blurry vision, chest pain, palpitations, or trouble breathing.

## 2021-05-02 NOTE — Assessment & Plan Note (Signed)
Patient recently discharged from the hospital following a R BKA revision and washout. She states that since her discharge, she has had minimal output into the wound vac. She notes that she has an appt to follow up with the vascular surgeons on 9/26 for staple removal and wound vac removal. Her wound remains dressed and she has a [place] who helps her change her dressing 3x a week.  Overall, the patient notes that she is still having significant pain and muscle spasms in her right leg. She was discharged with dilaudid for post-op pain management and flexeril for muscle spasms, however, she has been out of these meds. Her muscle spasms are so severe and limit her ability to sleep at night. Discussed bringing this up with the surgeons, and the patient will do so on Monday. Until then, will send in a short course of flexeril to help the patient get relief from her spasms.   Plan: - Follow up with vascular on 9/26 - Flexeril 5 mg TID x 5 days

## 2021-05-02 NOTE — Telephone Encounter (Signed)
Kathy Frank from [facility] calls today to report that Kathy Frank cancelled her appt today due to her daughter having an accident. She will keep her [facility] appt on 9/26.

## 2021-05-02 NOTE — Assessment & Plan Note (Signed)
A1c 7.1 in the hospital, although could be falsely low due to the patient being on hemodialysis. She was discharged on 10 units of lantus qhs and has been adherent with this, although she has not been able to check her sugars, as she lost her meter in the hospital. She denies any episodes of dizziness, lightheadedness, diaphoresis, fatigue, or any other sxs of hypoglycemia. Will provide the patient with a new meter today and advised her to at least check her sugars every morning and to bring in her meter at her next visit. Patient also expressed interest in starting SSI in addition to her lantus, however, we discussed that without her checking her blood sugars, this would not be advisable. The patient was previously on Victoza 1.'2mg'$  daily for her diabetes and did well with this medicine, however, it was discontinued in 2021, for an unclear reason. Will restart this today to achieve better glycemic control.   Plan: - Continue lantus 10u qhs - Start Victoza 0.'6mg'$  daily  - Recheck in 1 month, advised patient to bring in meter at this time

## 2021-05-05 ENCOUNTER — Ambulatory Visit (INDEPENDENT_AMBULATORY_CARE_PROVIDER_SITE_OTHER): Payer: Medicare Other | Admitting: Physician Assistant

## 2021-05-05 ENCOUNTER — Encounter: Payer: Medicare Other | Admitting: Surgery

## 2021-05-05 ENCOUNTER — Other Ambulatory Visit: Payer: Self-pay

## 2021-05-05 VITALS — BP 185/92 | HR 101 | Temp 98.6°F | Resp 20

## 2021-05-05 DIAGNOSIS — I739 Peripheral vascular disease, unspecified: Secondary | ICD-10-CM

## 2021-05-05 MED ORDER — METHOCARBAMOL 500 MG PO TABS: 500.0000 mg | ORAL_TABLET | Freq: Four times a day (QID) | ORAL | 0 refills | Status: DC

## 2021-05-05 NOTE — Progress Notes (Signed)
POST OPERATIVE OFFICE NOTE    CC:  F/u for surgery  HPI:  This is a 45 y.o. female who is s/p revision I & D of the right BKA on 04/18/21 by Dr. Stanford Breed.    Pt returns today for follow up.  Pt states home health changes the wound vac 3 times a week.  She states the pain is subsiding a little more each dressing change.  She has muscle spasms that bother her more than the pain.    No Known Allergies  Current Outpatient Medications  Medication Sig Dispense Refill   Accu-Chek Softclix Lancets lancets Use as instructed 100 each 12   albuterol (VENTOLIN HFA) 108 (90 Base) MCG/ACT inhaler Inhale 2 puffs into the lungs every 4 (four) hours as needed for wheezing or shortness of breath. 8.5 g 0   amLODipine (NORVASC) 10 MG tablet Take 1 tablet (10 mg total) by mouth daily. 30 tablet 0   aspirin EC 81 MG tablet Take 1 tablet (81 mg total) by mouth daily. Swallow whole. 90 tablet 3   atorvastatin (LIPITOR) 40 MG tablet Take 1 tablet (40 mg total) by mouth daily. 90 tablet 3   bictegravir-emtricitabine-tenofovir AF (BIKTARVY) 50-200-25 MG TABS tablet TAKE 1 TABLET BY MOUTH DAILY. 30 tablet 0   calcitRIOL (ROCALTROL) 0.25 MCG capsule Take 1 capsule (0.25 mcg total) by mouth Every Tuesday,Thursday,and Saturday with dialysis. 30 capsule 0   calcium acetate (PHOSLO) 667 MG capsule Take 3 capsules (2,001 mg total) by mouth 3 (three) times daily with meals. 180 capsule 0   clopidogrel (PLAVIX) 75 MG tablet Take 1 tablet (75 mg total) by mouth daily. 90 tablet 3   cyclobenzaprine (FLEXERIL) 5 MG tablet Take 1 tablet (5 mg total) by mouth 3 (three) times daily as needed for up to 5 days for muscle spasms. 15 tablet 0   gabapentin (NEURONTIN) 100 MG capsule Take 1-3 capsules (100-300 mg total) by mouth See admin instructions. Take 100 mg on Monday, Wednesday, Friday and Sunday 30 capsule 2   glucose blood test strip Use as instructed 100 each 12   hydrALAZINE (APRESOLINE) 50 MG tablet Take 1 tablet (50 mg total)  by mouth 3 (three) times daily. Take 1 tablet ('50mg'$ ) 3 times daily on NON DIALYSIS DAYS (MWFSun) 90 tablet 0   insulin glargine (LANTUS SOLOSTAR) 100 UNIT/ML Solostar Pen Inject 8 Units into the skin at bedtime. (Patient taking differently: Inject 10 Units into the skin at bedtime.) 15 mL 11   Insulin Pen Needle (PENTIPS) 32G X 4 MM MISC Use as directed 100 each 3   liraglutide (VICTOZA) 18 MG/3ML SOPN Inject 0.6 mg into the skin daily. 3 mL 0   methocarbamol (ROBAXIN) 500 MG tablet Take 1 tablet (500 mg total) by mouth 4 (four) times daily. 30 tablet 0   multivitamin (RENA-VIT) TABS tablet Take 1 tablet by mouth daily. 30 tablet 0   pantoprazole (PROTONIX) 40 MG tablet Take 1 tablet (40 mg total) by mouth daily. 30 tablet 0   polyethylene glycol (MIRALAX / GLYCOLAX) 17 g packet Take 17 g by mouth 2 (two) times daily. (Patient taking differently: Take 17 g by mouth daily as needed for mild constipation or moderate constipation.) 14 each 0   sertraline (ZOLOFT) 50 MG tablet Take 1 tablet (50 mg total) by mouth at bedtime. 30 tablet 0   No current facility-administered medications for this visit.     ROS:  See HPI  Physical Exam:  Incision:  no active drainage, min to mod edema medially Extremities:  stump is warm and appears viable    Assessment/Plan:  This is a 45 y.o. female who is s/p: revision I & D of the right BKA.  We placed wet to dry dressing over the wounds.  The wound is decreasing in size and appears viableHer home health RN is coming today and will replace the wound vac.   She will f/u in 2 weeks for wound check.  If she has questions or concerns she will call sooner.      Roxy Horseman PA-C [facility] [phone]   Clinic MD:  Trula Slade

## 2021-05-06 DIAGNOSIS — D509 Iron deficiency anemia, unspecified: Secondary | ICD-10-CM | POA: Diagnosis not present

## 2021-05-06 DIAGNOSIS — E1165 Type 2 diabetes mellitus with hyperglycemia: Secondary | ICD-10-CM | POA: Diagnosis not present

## 2021-05-06 DIAGNOSIS — Z992 Dependence on renal dialysis: Secondary | ICD-10-CM | POA: Diagnosis not present

## 2021-05-06 DIAGNOSIS — D689 Coagulation defect, unspecified: Secondary | ICD-10-CM | POA: Diagnosis not present

## 2021-05-06 DIAGNOSIS — D631 Anemia in chronic kidney disease: Secondary | ICD-10-CM | POA: Diagnosis not present

## 2021-05-06 DIAGNOSIS — N185 Chronic kidney disease, stage 5: Secondary | ICD-10-CM | POA: Diagnosis not present

## 2021-05-06 DIAGNOSIS — K219 Gastro-esophageal reflux disease without esophagitis: Secondary | ICD-10-CM | POA: Diagnosis not present

## 2021-05-06 DIAGNOSIS — N186 End stage renal disease: Secondary | ICD-10-CM | POA: Diagnosis not present

## 2021-05-06 DIAGNOSIS — I10 Essential (primary) hypertension: Secondary | ICD-10-CM | POA: Diagnosis not present

## 2021-05-06 DIAGNOSIS — E785 Hyperlipidemia, unspecified: Secondary | ICD-10-CM | POA: Diagnosis not present

## 2021-05-06 DIAGNOSIS — L299 Pruritus, unspecified: Secondary | ICD-10-CM | POA: Diagnosis not present

## 2021-05-06 DIAGNOSIS — T8781 Dehiscence of amputation stump: Secondary | ICD-10-CM | POA: Diagnosis not present

## 2021-05-06 DIAGNOSIS — N2581 Secondary hyperparathyroidism of renal origin: Secondary | ICD-10-CM | POA: Diagnosis not present

## 2021-05-06 DIAGNOSIS — I739 Peripheral vascular disease, unspecified: Secondary | ICD-10-CM | POA: Diagnosis not present

## 2021-05-06 DIAGNOSIS — Z89511 Acquired absence of right leg below knee: Secondary | ICD-10-CM | POA: Diagnosis not present

## 2021-05-06 DIAGNOSIS — Z794 Long term (current) use of insulin: Secondary | ICD-10-CM | POA: Diagnosis not present

## 2021-05-08 DIAGNOSIS — N2581 Secondary hyperparathyroidism of renal origin: Secondary | ICD-10-CM | POA: Diagnosis not present

## 2021-05-08 DIAGNOSIS — D689 Coagulation defect, unspecified: Secondary | ICD-10-CM | POA: Diagnosis not present

## 2021-05-08 DIAGNOSIS — D631 Anemia in chronic kidney disease: Secondary | ICD-10-CM | POA: Diagnosis not present

## 2021-05-08 DIAGNOSIS — L299 Pruritus, unspecified: Secondary | ICD-10-CM | POA: Diagnosis not present

## 2021-05-08 DIAGNOSIS — Z992 Dependence on renal dialysis: Secondary | ICD-10-CM | POA: Diagnosis not present

## 2021-05-08 DIAGNOSIS — N186 End stage renal disease: Secondary | ICD-10-CM | POA: Diagnosis not present

## 2021-05-08 DIAGNOSIS — D509 Iron deficiency anemia, unspecified: Secondary | ICD-10-CM | POA: Diagnosis not present

## 2021-05-09 ENCOUNTER — Ambulatory Visit: Payer: Medicare Other | Admitting: Physical Medicine and Rehabilitation

## 2021-05-09 DIAGNOSIS — Z89511 Acquired absence of right leg below knee: Secondary | ICD-10-CM | POA: Diagnosis not present

## 2021-05-09 DIAGNOSIS — T8781 Dehiscence of amputation stump: Secondary | ICD-10-CM | POA: Diagnosis not present

## 2021-05-09 DIAGNOSIS — I739 Peripheral vascular disease, unspecified: Secondary | ICD-10-CM | POA: Diagnosis not present

## 2021-05-09 DIAGNOSIS — E1129 Type 2 diabetes mellitus with other diabetic kidney complication: Secondary | ICD-10-CM | POA: Diagnosis not present

## 2021-05-09 DIAGNOSIS — E1165 Type 2 diabetes mellitus with hyperglycemia: Secondary | ICD-10-CM | POA: Diagnosis not present

## 2021-05-09 DIAGNOSIS — Z992 Dependence on renal dialysis: Secondary | ICD-10-CM | POA: Diagnosis not present

## 2021-05-09 DIAGNOSIS — E785 Hyperlipidemia, unspecified: Secondary | ICD-10-CM | POA: Diagnosis not present

## 2021-05-09 DIAGNOSIS — N185 Chronic kidney disease, stage 5: Secondary | ICD-10-CM | POA: Diagnosis not present

## 2021-05-09 DIAGNOSIS — K219 Gastro-esophageal reflux disease without esophagitis: Secondary | ICD-10-CM | POA: Diagnosis not present

## 2021-05-09 DIAGNOSIS — N186 End stage renal disease: Secondary | ICD-10-CM | POA: Diagnosis not present

## 2021-05-09 DIAGNOSIS — I10 Essential (primary) hypertension: Secondary | ICD-10-CM | POA: Diagnosis not present

## 2021-05-09 DIAGNOSIS — Z794 Long term (current) use of insulin: Secondary | ICD-10-CM | POA: Diagnosis not present

## 2021-05-10 DIAGNOSIS — T8189XA Other complications of procedures, not elsewhere classified, initial encounter: Secondary | ICD-10-CM | POA: Diagnosis not present

## 2021-05-12 DIAGNOSIS — Z794 Long term (current) use of insulin: Secondary | ICD-10-CM | POA: Diagnosis not present

## 2021-05-12 DIAGNOSIS — E785 Hyperlipidemia, unspecified: Secondary | ICD-10-CM | POA: Diagnosis not present

## 2021-05-12 DIAGNOSIS — E1165 Type 2 diabetes mellitus with hyperglycemia: Secondary | ICD-10-CM | POA: Diagnosis not present

## 2021-05-12 DIAGNOSIS — I739 Peripheral vascular disease, unspecified: Secondary | ICD-10-CM | POA: Diagnosis not present

## 2021-05-12 DIAGNOSIS — I10 Essential (primary) hypertension: Secondary | ICD-10-CM | POA: Diagnosis not present

## 2021-05-12 DIAGNOSIS — Z89511 Acquired absence of right leg below knee: Secondary | ICD-10-CM | POA: Diagnosis not present

## 2021-05-12 DIAGNOSIS — N185 Chronic kidney disease, stage 5: Secondary | ICD-10-CM | POA: Diagnosis not present

## 2021-05-12 DIAGNOSIS — T8781 Dehiscence of amputation stump: Secondary | ICD-10-CM | POA: Diagnosis not present

## 2021-05-12 DIAGNOSIS — K219 Gastro-esophageal reflux disease without esophagitis: Secondary | ICD-10-CM | POA: Diagnosis not present

## 2021-05-13 DIAGNOSIS — D509 Iron deficiency anemia, unspecified: Secondary | ICD-10-CM | POA: Diagnosis not present

## 2021-05-13 DIAGNOSIS — D631 Anemia in chronic kidney disease: Secondary | ICD-10-CM | POA: Diagnosis not present

## 2021-05-13 DIAGNOSIS — Z992 Dependence on renal dialysis: Secondary | ICD-10-CM | POA: Diagnosis not present

## 2021-05-13 DIAGNOSIS — R197 Diarrhea, unspecified: Secondary | ICD-10-CM | POA: Diagnosis not present

## 2021-05-13 DIAGNOSIS — E1110 Type 2 diabetes mellitus with ketoacidosis without coma: Secondary | ICD-10-CM | POA: Diagnosis not present

## 2021-05-13 DIAGNOSIS — D689 Coagulation defect, unspecified: Secondary | ICD-10-CM | POA: Diagnosis not present

## 2021-05-13 DIAGNOSIS — N186 End stage renal disease: Secondary | ICD-10-CM | POA: Diagnosis not present

## 2021-05-13 DIAGNOSIS — N2581 Secondary hyperparathyroidism of renal origin: Secondary | ICD-10-CM | POA: Diagnosis not present

## 2021-05-14 DIAGNOSIS — Z794 Long term (current) use of insulin: Secondary | ICD-10-CM | POA: Diagnosis not present

## 2021-05-14 DIAGNOSIS — T8781 Dehiscence of amputation stump: Secondary | ICD-10-CM | POA: Diagnosis not present

## 2021-05-14 DIAGNOSIS — K219 Gastro-esophageal reflux disease without esophagitis: Secondary | ICD-10-CM | POA: Diagnosis not present

## 2021-05-14 DIAGNOSIS — I10 Essential (primary) hypertension: Secondary | ICD-10-CM | POA: Diagnosis not present

## 2021-05-14 DIAGNOSIS — I739 Peripheral vascular disease, unspecified: Secondary | ICD-10-CM | POA: Diagnosis not present

## 2021-05-14 DIAGNOSIS — N185 Chronic kidney disease, stage 5: Secondary | ICD-10-CM | POA: Diagnosis not present

## 2021-05-14 DIAGNOSIS — Z89511 Acquired absence of right leg below knee: Secondary | ICD-10-CM | POA: Diagnosis not present

## 2021-05-14 DIAGNOSIS — E1165 Type 2 diabetes mellitus with hyperglycemia: Secondary | ICD-10-CM | POA: Diagnosis not present

## 2021-05-14 DIAGNOSIS — E785 Hyperlipidemia, unspecified: Secondary | ICD-10-CM | POA: Diagnosis not present

## 2021-05-15 ENCOUNTER — Other Ambulatory Visit: Payer: Self-pay

## 2021-05-15 DIAGNOSIS — Z992 Dependence on renal dialysis: Secondary | ICD-10-CM | POA: Diagnosis not present

## 2021-05-15 DIAGNOSIS — D631 Anemia in chronic kidney disease: Secondary | ICD-10-CM | POA: Diagnosis not present

## 2021-05-15 DIAGNOSIS — R197 Diarrhea, unspecified: Secondary | ICD-10-CM | POA: Diagnosis not present

## 2021-05-15 DIAGNOSIS — N2581 Secondary hyperparathyroidism of renal origin: Secondary | ICD-10-CM | POA: Diagnosis not present

## 2021-05-15 DIAGNOSIS — N186 End stage renal disease: Secondary | ICD-10-CM | POA: Diagnosis not present

## 2021-05-15 DIAGNOSIS — D509 Iron deficiency anemia, unspecified: Secondary | ICD-10-CM | POA: Diagnosis not present

## 2021-05-15 DIAGNOSIS — D689 Coagulation defect, unspecified: Secondary | ICD-10-CM | POA: Diagnosis not present

## 2021-05-15 DIAGNOSIS — E1110 Type 2 diabetes mellitus with ketoacidosis without coma: Secondary | ICD-10-CM | POA: Diagnosis not present

## 2021-05-15 MED ORDER — AMLODIPINE BESYLATE 10 MG PO TABS: 10.0000 mg | ORAL_TABLET | Freq: Every day | ORAL | 3 refills | Status: DC

## 2021-05-17 DIAGNOSIS — E1169 Type 2 diabetes mellitus with other specified complication: Secondary | ICD-10-CM | POA: Diagnosis not present

## 2021-05-17 DIAGNOSIS — Z89612 Acquired absence of left leg above knee: Secondary | ICD-10-CM | POA: Diagnosis not present

## 2021-05-17 DIAGNOSIS — D689 Coagulation defect, unspecified: Secondary | ICD-10-CM | POA: Diagnosis not present

## 2021-05-17 DIAGNOSIS — N186 End stage renal disease: Secondary | ICD-10-CM | POA: Diagnosis not present

## 2021-05-17 DIAGNOSIS — E1110 Type 2 diabetes mellitus with ketoacidosis without coma: Secondary | ICD-10-CM | POA: Diagnosis not present

## 2021-05-17 DIAGNOSIS — Z992 Dependence on renal dialysis: Secondary | ICD-10-CM | POA: Diagnosis not present

## 2021-05-17 DIAGNOSIS — R197 Diarrhea, unspecified: Secondary | ICD-10-CM | POA: Diagnosis not present

## 2021-05-17 DIAGNOSIS — D509 Iron deficiency anemia, unspecified: Secondary | ICD-10-CM | POA: Diagnosis not present

## 2021-05-17 DIAGNOSIS — D631 Anemia in chronic kidney disease: Secondary | ICD-10-CM | POA: Diagnosis not present

## 2021-05-17 DIAGNOSIS — N2581 Secondary hyperparathyroidism of renal origin: Secondary | ICD-10-CM | POA: Diagnosis not present

## 2021-05-17 DIAGNOSIS — Z89511 Acquired absence of right leg below knee: Secondary | ICD-10-CM | POA: Diagnosis not present

## 2021-05-19 ENCOUNTER — Ambulatory Visit: Payer: Medicare Other

## 2021-05-20 ENCOUNTER — Emergency Department (HOSPITAL_COMMUNITY): Payer: Medicare Other

## 2021-05-20 ENCOUNTER — Encounter (HOSPITAL_COMMUNITY): Payer: Self-pay | Admitting: Emergency Medicine

## 2021-05-20 ENCOUNTER — Inpatient Hospital Stay (HOSPITAL_COMMUNITY)
Admission: EM | Admit: 2021-05-20 | Discharge: 2021-05-23 | DRG: 246 | Disposition: A | Discharge status: Home Health | Payer: Medicare Other | Attending: Internal Medicine | Admitting: Internal Medicine

## 2021-05-20 DIAGNOSIS — I120 Hypertensive chronic kidney disease with stage 5 chronic kidney disease or end stage renal disease: Secondary | ICD-10-CM | POA: Diagnosis not present

## 2021-05-20 DIAGNOSIS — N2581 Secondary hyperparathyroidism of renal origin: Secondary | ICD-10-CM | POA: Diagnosis not present

## 2021-05-20 DIAGNOSIS — Z89612 Acquired absence of left leg above knee: Secondary | ICD-10-CM

## 2021-05-20 DIAGNOSIS — R0602 Shortness of breath: Secondary | ICD-10-CM

## 2021-05-20 DIAGNOSIS — I160 Hypertensive urgency: Secondary | ICD-10-CM | POA: Diagnosis present

## 2021-05-20 DIAGNOSIS — I214 Non-ST elevation (NSTEMI) myocardial infarction: Principal | ICD-10-CM | POA: Diagnosis present

## 2021-05-20 DIAGNOSIS — Z7982 Long term (current) use of aspirin: Secondary | ICD-10-CM

## 2021-05-20 DIAGNOSIS — I161 Hypertensive emergency: Secondary | ICD-10-CM | POA: Diagnosis present

## 2021-05-20 DIAGNOSIS — Z7902 Long term (current) use of antithrombotics/antiplatelets: Secondary | ICD-10-CM

## 2021-05-20 DIAGNOSIS — E875 Hyperkalemia: Secondary | ICD-10-CM | POA: Diagnosis present

## 2021-05-20 DIAGNOSIS — I25110 Atherosclerotic heart disease of native coronary artery with unstable angina pectoris: Secondary | ICD-10-CM | POA: Diagnosis not present

## 2021-05-20 DIAGNOSIS — E1165 Type 2 diabetes mellitus with hyperglycemia: Secondary | ICD-10-CM | POA: Diagnosis not present

## 2021-05-20 DIAGNOSIS — K219 Gastro-esophageal reflux disease without esophagitis: Secondary | ICD-10-CM | POA: Diagnosis not present

## 2021-05-20 DIAGNOSIS — E1143 Type 2 diabetes mellitus with diabetic autonomic (poly)neuropathy: Secondary | ICD-10-CM | POA: Diagnosis present

## 2021-05-20 DIAGNOSIS — E872 Acidosis, unspecified: Secondary | ICD-10-CM | POA: Diagnosis present

## 2021-05-20 DIAGNOSIS — I700 Atherosclerosis of aorta: Secondary | ICD-10-CM | POA: Diagnosis present

## 2021-05-20 DIAGNOSIS — E1151 Type 2 diabetes mellitus with diabetic peripheral angiopathy without gangrene: Secondary | ICD-10-CM | POA: Diagnosis not present

## 2021-05-20 DIAGNOSIS — Z87891 Personal history of nicotine dependence: Secondary | ICD-10-CM

## 2021-05-20 DIAGNOSIS — G43909 Migraine, unspecified, not intractable, without status migrainosus: Secondary | ICD-10-CM | POA: Diagnosis not present

## 2021-05-20 DIAGNOSIS — Z7985 Long-term (current) use of injectable non-insulin antidiabetic drugs: Secondary | ICD-10-CM

## 2021-05-20 DIAGNOSIS — N186 End stage renal disease: Secondary | ICD-10-CM | POA: Diagnosis not present

## 2021-05-20 DIAGNOSIS — Z89512 Acquired absence of left leg below knee: Secondary | ICD-10-CM

## 2021-05-20 DIAGNOSIS — Z20822 Contact with and (suspected) exposure to covid-19: Secondary | ICD-10-CM | POA: Diagnosis present

## 2021-05-20 DIAGNOSIS — Z794 Long term (current) use of insulin: Secondary | ICD-10-CM

## 2021-05-20 DIAGNOSIS — Z955 Presence of coronary angioplasty implant and graft: Secondary | ICD-10-CM | POA: Diagnosis not present

## 2021-05-20 DIAGNOSIS — Z560 Unemployment, unspecified: Secondary | ICD-10-CM

## 2021-05-20 DIAGNOSIS — D631 Anemia in chronic kidney disease: Secondary | ICD-10-CM | POA: Diagnosis not present

## 2021-05-20 DIAGNOSIS — E1122 Type 2 diabetes mellitus with diabetic chronic kidney disease: Secondary | ICD-10-CM | POA: Diagnosis not present

## 2021-05-20 DIAGNOSIS — Z89611 Acquired absence of right leg above knee: Secondary | ICD-10-CM

## 2021-05-20 DIAGNOSIS — Z8673 Personal history of transient ischemic attack (TIA), and cerebral infarction without residual deficits: Secondary | ICD-10-CM

## 2021-05-20 DIAGNOSIS — R519 Headache, unspecified: Secondary | ICD-10-CM | POA: Diagnosis not present

## 2021-05-20 DIAGNOSIS — E119 Type 2 diabetes mellitus without complications: Secondary | ICD-10-CM | POA: Diagnosis present

## 2021-05-20 DIAGNOSIS — E871 Hypo-osmolality and hyponatremia: Secondary | ICD-10-CM | POA: Diagnosis not present

## 2021-05-20 DIAGNOSIS — K3184 Gastroparesis: Secondary | ICD-10-CM | POA: Diagnosis present

## 2021-05-20 DIAGNOSIS — E113393 Type 2 diabetes mellitus with moderate nonproliferative diabetic retinopathy without macular edema, bilateral: Secondary | ICD-10-CM | POA: Diagnosis not present

## 2021-05-20 DIAGNOSIS — F32A Depression, unspecified: Secondary | ICD-10-CM | POA: Diagnosis not present

## 2021-05-20 DIAGNOSIS — E669 Obesity, unspecified: Secondary | ICD-10-CM | POA: Diagnosis present

## 2021-05-20 DIAGNOSIS — Z993 Dependence on wheelchair: Secondary | ICD-10-CM

## 2021-05-20 DIAGNOSIS — Z833 Family history of diabetes mellitus: Secondary | ICD-10-CM

## 2021-05-20 DIAGNOSIS — I739 Peripheral vascular disease, unspecified: Secondary | ICD-10-CM | POA: Diagnosis present

## 2021-05-20 DIAGNOSIS — Z992 Dependence on renal dialysis: Secondary | ICD-10-CM | POA: Diagnosis not present

## 2021-05-20 DIAGNOSIS — T879 Unspecified complications of amputation stump: Secondary | ICD-10-CM

## 2021-05-20 DIAGNOSIS — I252 Old myocardial infarction: Secondary | ICD-10-CM | POA: Diagnosis present

## 2021-05-20 DIAGNOSIS — Z6824 Body mass index (BMI) 24.0-24.9, adult: Secondary | ICD-10-CM

## 2021-05-20 DIAGNOSIS — B20 Human immunodeficiency virus [HIV] disease: Secondary | ICD-10-CM | POA: Diagnosis present

## 2021-05-20 DIAGNOSIS — R079 Chest pain, unspecified: Secondary | ICD-10-CM | POA: Diagnosis not present

## 2021-05-20 DIAGNOSIS — Z79899 Other long term (current) drug therapy: Secondary | ICD-10-CM

## 2021-05-20 DIAGNOSIS — F419 Anxiety disorder, unspecified: Secondary | ICD-10-CM | POA: Diagnosis present

## 2021-05-20 DIAGNOSIS — I10 Essential (primary) hypertension: Secondary | ICD-10-CM | POA: Diagnosis not present

## 2021-05-20 DIAGNOSIS — E1169 Type 2 diabetes mellitus with other specified complication: Secondary | ICD-10-CM | POA: Diagnosis present

## 2021-05-20 HISTORY — DX: Hypertensive urgency: I16.0

## 2021-05-20 LAB — CBC WITH DIFFERENTIAL/PLATELET
Abs Immature Granulocytes: 0.04 10*3/uL (ref 0.00–0.07)
Basophils Absolute: 0.1 10*3/uL (ref 0.0–0.1)
Basophils Relative: 1 %
Eosinophils Absolute: 0.6 10*3/uL — ABNORMAL HIGH (ref 0.0–0.5)
Eosinophils Relative: 7 %
HCT: 33.1 % — ABNORMAL LOW (ref 36.0–46.0)
Hemoglobin: 9.9 g/dL — ABNORMAL LOW (ref 12.0–15.0)
Immature Granulocytes: 1 %
Lymphocytes Relative: 24 %
Lymphs Abs: 1.9 10*3/uL (ref 0.7–4.0)
MCH: 29.5 pg (ref 26.0–34.0)
MCHC: 29.9 g/dL — ABNORMAL LOW (ref 30.0–36.0)
MCV: 98.5 fL (ref 80.0–100.0)
Monocytes Absolute: 0.6 10*3/uL (ref 0.1–1.0)
Monocytes Relative: 8 %
Neutro Abs: 4.6 10*3/uL (ref 1.7–7.7)
Neutrophils Relative %: 59 %
Platelets: 385 10*3/uL (ref 150–400)
RBC: 3.36 MIL/uL — ABNORMAL LOW (ref 3.87–5.11)
RDW: 19.8 % — ABNORMAL HIGH (ref 11.5–15.5)
WBC: 7.7 10*3/uL (ref 4.0–10.5)
nRBC: 0.3 % — ABNORMAL HIGH (ref 0.0–0.2)

## 2021-05-20 LAB — HEPATITIS B SURFACE ANTIGEN: Hepatitis B Surface Ag: NONREACTIVE

## 2021-05-20 LAB — LIPASE, BLOOD: Lipase: 38 U/L (ref 11–51)

## 2021-05-20 LAB — BETA-HYDROXYBUTYRIC ACID: Beta-Hydroxybutyric Acid: 0.37 mmol/L — ABNORMAL HIGH (ref 0.05–0.27)

## 2021-05-20 LAB — HEPARIN LEVEL (UNFRACTIONATED): Heparin Unfractionated: <0.1 IU/mL — ABNORMAL LOW (ref 0.30–0.70)

## 2021-05-20 LAB — I-STAT VENOUS BLOOD GAS, ED
Acid-base deficit: 3 mmol/L — ABNORMAL HIGH (ref 0.0–2.0)
Bicarbonate: 22.3 mmol/L (ref 20.0–28.0)
Calcium, Ion: 0.84 mmol/L — CL (ref 1.15–1.40)
HCT: 34 % — ABNORMAL LOW (ref 36.0–46.0)
Hemoglobin: 11.6 g/dL — ABNORMAL LOW (ref 12.0–15.0)
O2 Saturation: 80 %
Potassium: 5.2 mmol/L — ABNORMAL HIGH (ref 3.5–5.1)
Sodium: 137 mmol/L (ref 135–145)
TCO2: 24 mmol/L (ref 22–32)
pCO2, Ven: 41.3 mmHg — ABNORMAL LOW (ref 44.0–60.0)
pH, Ven: 7.341 (ref 7.250–7.430)
pO2, Ven: 47 mmHg — ABNORMAL HIGH (ref 32.0–45.0)

## 2021-05-20 LAB — CREATININE, SERUM
Creatinine, Ser: 9.25 mg/dL — ABNORMAL HIGH (ref 0.44–1.00)
GFR, Estimated: 5 mL/min — ABNORMAL LOW (ref >60)

## 2021-05-20 LAB — GLUCOSE, CAPILLARY: Glucose-Capillary: 123 mg/dL — ABNORMAL HIGH (ref 70–99)

## 2021-05-20 LAB — TROPONIN I (HIGH SENSITIVITY)
Troponin I (High Sensitivity): 650 ng/L (ref <18)
Troponin I (High Sensitivity): 849 ng/L (ref <18)
Troponin I (High Sensitivity): 967 ng/L (ref <18)
Troponin I (High Sensitivity): 778 ng/L (ref <18)
Troponin I (High Sensitivity): 805 ng/L (ref <18)

## 2021-05-20 LAB — BASIC METABOLIC PANEL
Anion gap: 25 — ABNORMAL HIGH (ref 5–15)
BUN: 68 mg/dL — ABNORMAL HIGH (ref 6–20)
CO2: 19 mmol/L — ABNORMAL LOW (ref 22–32)
Calcium: 8.2 mg/dL — ABNORMAL LOW (ref 8.9–10.3)
Chloride: 96 mmol/L — ABNORMAL LOW (ref 98–111)
Creatinine, Ser: 8.85 mg/dL — ABNORMAL HIGH (ref 0.44–1.00)
GFR, Estimated: 5 mL/min — ABNORMAL LOW (ref >60)
Glucose, Bld: 113 mg/dL — ABNORMAL HIGH (ref 70–99)
Potassium: 5.8 mmol/L — ABNORMAL HIGH (ref 3.5–5.1)
Sodium: 140 mmol/L (ref 135–145)

## 2021-05-20 LAB — RESP PANEL BY RT-PCR (FLU A&B, COVID) ARPGX2
Influenza A by PCR: NEGATIVE
Influenza B by PCR: NEGATIVE
SARS Coronavirus 2 by RT PCR: NEGATIVE

## 2021-05-20 LAB — HEPATIC FUNCTION PANEL
ALT: 9 U/L (ref 0–44)
AST: 15 U/L (ref 15–41)
Albumin: 3.3 g/dL — ABNORMAL LOW (ref 3.5–5.0)
Alkaline Phosphatase: 76 U/L (ref 38–126)
Bilirubin, Direct: 0.1 mg/dL (ref 0.0–0.2)
Indirect Bilirubin: 0.9 mg/dL (ref 0.3–0.9)
Total Bilirubin: 1 mg/dL (ref 0.3–1.2)
Total Protein: 9.2 g/dL — ABNORMAL HIGH (ref 6.5–8.1)

## 2021-05-20 LAB — PROTIME-INR
INR: 1.1 (ref 0.8–1.2)
Prothrombin Time: 14.2 seconds (ref 11.4–15.2)

## 2021-05-20 LAB — APTT: aPTT: 38 seconds — ABNORMAL HIGH (ref 24–36)

## 2021-05-20 LAB — HEPATITIS B SURFACE ANTIBODY,QUALITATIVE: Hep B S Ab: REACTIVE — AB

## 2021-05-20 MED ORDER — HEPARIN SODIUM (PORCINE) 1000 UNIT/ML DIALYSIS
1000.0000 [IU] | INTRAMUSCULAR | Status: DC | PRN
  Filled 2021-05-20: qty 1

## 2021-05-20 MED ORDER — ATORVASTATIN CALCIUM 40 MG PO TABS
40.0000 mg | ORAL_TABLET | Freq: Every day | ORAL | Status: DC
  Administered 2021-05-20 – 2021-05-21 (×2): 40 mg via ORAL
  Filled 2021-05-20 (×3): qty 1

## 2021-05-20 MED ORDER — SODIUM CHLORIDE 0.9 % IV SOLN
250.0000 mL | INTRAVENOUS | Status: DC | PRN
Start: 2021-05-20 — End: 2021-05-21

## 2021-05-20 MED ORDER — INSULIN ASPART 100 UNIT/ML IV SOLN
5.0000 [IU] | Freq: Once | INTRAVENOUS | Status: AC
  Administered 2021-05-20: 5 [IU] via INTRAVENOUS

## 2021-05-20 MED ORDER — SODIUM CHLORIDE 0.9 % IV SOLN: INTRAVENOUS | Status: DC

## 2021-05-20 MED ORDER — METOPROLOL TARTRATE 25 MG PO TABS
25.0000 mg | ORAL_TABLET | Freq: Two times a day (BID) | ORAL | Status: DC
  Administered 2021-05-20 – 2021-05-23 (×6): 25 mg via ORAL
  Filled 2021-05-20 (×6): qty 1

## 2021-05-20 MED ORDER — SODIUM CHLORIDE 0.9 % IV SOLN: 100.0000 mL | INTRAVENOUS | Status: DC | PRN

## 2021-05-20 MED ORDER — PANTOPRAZOLE SODIUM 40 MG PO TBEC
40.0000 mg | DELAYED_RELEASE_TABLET | Freq: Every day | ORAL | Status: DC
  Administered 2021-05-20 – 2021-05-23 (×4): 40 mg via ORAL
  Filled 2021-05-20 (×5): qty 1

## 2021-05-20 MED ORDER — ACETAMINOPHEN 325 MG PO TABS
650.0000 mg | ORAL_TABLET | Freq: Four times a day (QID) | ORAL | Status: DC | PRN
  Administered 2021-05-20 – 2021-05-21 (×3): 650 mg via ORAL
  Filled 2021-05-20 (×3): qty 2

## 2021-05-20 MED ORDER — SODIUM CHLORIDE 0.9% FLUSH
3.0000 mL | INTRAVENOUS | Status: DC | PRN
Start: 2021-05-20 — End: 2021-05-21

## 2021-05-20 MED ORDER — ACETAMINOPHEN 650 MG RE SUPP: 650.0000 mg | Freq: Four times a day (QID) | RECTAL | Status: DC | PRN

## 2021-05-20 MED ORDER — ALTEPLASE 2 MG IJ SOLR: 2.0000 mg | Freq: Once | INTRAMUSCULAR | Status: DC | PRN

## 2021-05-20 MED ORDER — HEPARIN SODIUM (PORCINE) 1000 UNIT/ML DIALYSIS
3000.0000 [IU] | INTRAMUSCULAR | Status: DC | PRN
  Filled 2021-05-20: qty 3

## 2021-05-20 MED ORDER — AMLODIPINE BESYLATE 10 MG PO TABS
10.0000 mg | ORAL_TABLET | Freq: Every day | ORAL | Status: DC
  Administered 2021-05-20 – 2021-05-23 (×4): 10 mg via ORAL
  Filled 2021-05-20 (×4): qty 1
  Filled 2021-05-20: qty 2

## 2021-05-20 MED ORDER — CALCITRIOL 0.25 MCG PO CAPS
0.2500 ug | ORAL_CAPSULE | ORAL | Status: DC
  Administered 2021-05-20 – 2021-05-22 (×2): 0.25 ug via ORAL
  Filled 2021-05-20 (×2): qty 1

## 2021-05-20 MED ORDER — CLOPIDOGREL BISULFATE 75 MG PO TABS
75.0000 mg | ORAL_TABLET | Freq: Every day | ORAL | Status: DC
  Administered 2021-05-20 – 2021-05-23 (×4): 75 mg via ORAL
  Filled 2021-05-20 (×5): qty 1

## 2021-05-20 MED ORDER — HEPARIN SODIUM (PORCINE) 5000 UNIT/ML IJ SOLN
5000.0000 [IU] | Freq: Three times a day (TID) | INTRAMUSCULAR | Status: DC
Start: 2021-05-21 — End: 2021-05-20

## 2021-05-20 MED ORDER — HEPARIN (PORCINE) 25000 UT/250ML-% IV SOLN
800.0000 [IU]/h | INTRAVENOUS | Status: DC
  Administered 2021-05-20 – 2021-05-21 (×2): 800 [IU]/h via INTRAVENOUS
  Filled 2021-05-20 (×2): qty 250

## 2021-05-20 MED ORDER — CHLORHEXIDINE GLUCONATE CLOTH 2 % EX PADS
6.0000 | MEDICATED_PAD | Freq: Every day | CUTANEOUS | Status: DC
  Administered 2021-05-21 – 2021-05-23 (×3): 6 via TOPICAL

## 2021-05-20 MED ORDER — LABETALOL HCL 5 MG/ML IV SOLN
10.0000 mg | INTRAVENOUS | Status: DC | PRN
  Filled 2021-05-20 (×2): qty 4

## 2021-05-20 MED ORDER — BICTEGRAVIR-EMTRICITAB-TENOFOV 50-200-25 MG PO TABS
1.0000 | ORAL_TABLET | Freq: Every day | ORAL | Status: DC
  Administered 2021-05-21 – 2021-05-23 (×3): 1 via ORAL
  Filled 2021-05-20 (×5): qty 1

## 2021-05-20 MED ORDER — LIDOCAINE HCL (PF) 1 % IJ SOLN: 5.0000 mL | INTRAMUSCULAR | Status: DC | PRN

## 2021-05-20 MED ORDER — ASPIRIN EC 81 MG PO TBEC
81.0000 mg | DELAYED_RELEASE_TABLET | Freq: Every day | ORAL | Status: DC
  Administered 2021-05-20 – 2021-05-23 (×4): 81 mg via ORAL
  Filled 2021-05-20 (×5): qty 1

## 2021-05-20 MED ORDER — HYDRALAZINE HCL 50 MG PO TABS
50.0000 mg | ORAL_TABLET | Freq: Three times a day (TID) | ORAL | Status: DC
  Administered 2021-05-20 – 2021-05-21 (×4): 50 mg via ORAL
  Filled 2021-05-20 (×2): qty 1
  Filled 2021-05-20 (×2): qty 2
  Filled 2021-05-20: qty 1

## 2021-05-20 MED ORDER — INSULIN GLARGINE-YFGN 100 UNIT/ML ~~LOC~~ SOLN
8.0000 [IU] | Freq: Every day | SUBCUTANEOUS | Status: DC
  Administered 2021-05-20 – 2021-05-22 (×3): 8 [IU] via SUBCUTANEOUS
  Filled 2021-05-20 (×5): qty 0.08

## 2021-05-20 MED ORDER — HEPARIN BOLUS VIA INFUSION
4000.0000 [IU] | Freq: Once | INTRAVENOUS | Status: AC
  Administered 2021-05-20: 4000 [IU] via INTRAVENOUS
  Filled 2021-05-20: qty 4000

## 2021-05-20 MED ORDER — SERTRALINE HCL 50 MG PO TABS
50.0000 mg | ORAL_TABLET | Freq: Every day | ORAL | Status: DC
  Administered 2021-05-20 – 2021-05-22 (×3): 50 mg via ORAL
  Filled 2021-05-20 (×3): qty 1

## 2021-05-20 MED ORDER — CALCIUM ACETATE (PHOS BINDER) 667 MG PO CAPS
2001.0000 mg | ORAL_CAPSULE | Freq: Three times a day (TID) | ORAL | Status: DC
  Administered 2021-05-20 – 2021-05-23 (×9): 2001 mg via ORAL
  Filled 2021-05-20 (×9): qty 3

## 2021-05-20 MED ORDER — DEXTROSE 50 % IV SOLN
1.0000 | Freq: Once | INTRAVENOUS | Status: AC
  Administered 2021-05-20: 50 mL via INTRAVENOUS
  Filled 2021-05-20: qty 50

## 2021-05-20 MED ORDER — POLYETHYLENE GLYCOL 3350 17 G PO PACK
17.0000 g | PACK | Freq: Every day | ORAL | Status: DC | PRN
  Administered 2021-05-23: 17 g via ORAL
  Filled 2021-05-20 (×2): qty 1

## 2021-05-20 MED ORDER — INSULIN ASPART 100 UNIT/ML IJ SOLN
0.0000 [IU] | Freq: Three times a day (TID) | INTRAMUSCULAR | Status: DC
  Administered 2021-05-22: 3 [IU] via SUBCUTANEOUS
  Administered 2021-05-23: 2 [IU] via SUBCUTANEOUS
  Administered 2021-05-23: 4 [IU] via SUBCUTANEOUS
  Administered 2021-05-23: 6 [IU] via SUBCUTANEOUS

## 2021-05-20 MED ORDER — LIDOCAINE-PRILOCAINE 2.5-2.5 % EX CREA
1.0000 "application " | TOPICAL_CREAM | CUTANEOUS | Status: DC | PRN
  Filled 2021-05-20: qty 5

## 2021-05-20 MED ORDER — PENTAFLUOROPROP-TETRAFLUOROETH EX AERO
1.0000 "application " | INHALATION_SPRAY | CUTANEOUS | Status: DC | PRN
  Filled 2021-05-20: qty 116

## 2021-05-20 MED ORDER — SODIUM ZIRCONIUM CYCLOSILICATE 10 G PO PACK
10.0000 g | PACK | Freq: Every day | ORAL | Status: DC
  Administered 2021-05-20 – 2021-05-22 (×3): 10 g via ORAL
  Filled 2021-05-20 (×5): qty 1

## 2021-05-20 MED ORDER — SODIUM CHLORIDE 0.9% FLUSH
3.0000 mL | Freq: Two times a day (BID) | INTRAVENOUS | Status: DC
  Administered 2021-05-20 – 2021-05-22 (×5): 3 mL via INTRAVENOUS

## 2021-05-20 NOTE — ED Notes (Signed)
MD Trifan notified of trop 650.

## 2021-05-20 NOTE — ED Provider Notes (Signed)
I provided a substantive portion of the care of this patient.  I personally performed the entirety of the medical decision making for this encounter.  EKG Interpretation  Date/Time:  Tuesday May 20 2021 10:46:22 EDT Ventricular Rate:  90 PR Interval:  153 QRS Duration: 94 QT Interval:  425 QTC Calculation: 521 R Axis:   105 Text Interpretation: Sinus rhythm Probable left atrial enlargement Anterior infarct, old Prolonged QT interval Confirmed by Lacretia Leigh ([id number]) on 05/20/2021 11:81:84 AM   45 year old female presents with chest pain and shortness of breath.  Has positive troponin here.  EKG without ischemic changes.  Will be admitted to the family practice teaching service   Lacretia Leigh, MD 05/20/21 1146

## 2021-05-20 NOTE — ED Triage Notes (Signed)
Patient arrived with EMS from [facility] reports SOB onset last night , CBG=122 by EMS , hypertensive BP= 220/102 , hemodialysis q Tues/Thurs/Sat.

## 2021-05-20 NOTE — Progress Notes (Addendum)
Hemodialysis- Treatment completed without issue. UF 2.1L. Patient had symptomatic bp drop x2 with HD resulting in decreased UFR/GOAL. BP remains high. Given scheduled hydralazine post treatment. Reported off to [place] on 5W.

## 2021-05-20 NOTE — Consult Note (Signed)
Cardiology Consultation:   Patient ID: Kathy Frank MRN: [id number]; DOB: 09-14-1975  Admit date: 05/20/2021 Date of Consult: 05/20/2021  PCP:  [name], MD   [facility] Providers Cardiologist:  None   {  Patient Profile:   ARRYN DEWAR is a 45 y.o. 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 Jimmye Norman.  History of Present Illness:   Ms. Heelan is a 45 year old female with past medical history noted above.  She has never been evaluated by cardiology prior to this admission.  History of ESRD on HD, follows a Tuesday Thursday Saturday schedule. Has been on HD for the past two years. Denies any family hx of CAD.  She is followed by [facility] recently admitted for revision I&D of right BKA with wound VAC.  Presented to the ED on 10/11. Says she woke up this morning with severe headache, along with sharp chest pain and shortness of breath.  States her headache was quite severe.  Symptoms did not wake her from sleep.  Reports the pain was centralized and sharp in her chest without radiation into her arms back or jaw.  Also had some shortness of breath.  Ultimately presented to the ED for further evaluation.  In the ED her labs showed sodium 140, potassium 5.8>>5.2, creatinine 8.8, high-sensitivity troponin 650>>849>>967, WBC 7.7, hemoglobin 9.9.  Chest x-ray negative.  EKG showed sinus rhythm, 90 bpm peaked T waves.  CT head with no acute abnormality.  He was admitted by internal medicine, cardiology asked to evaluate in the context of elevated troponin.   Past Medical History:  Diagnosis Date   Anal abscess    chronic   Anxiety    Depression 06/28/2006   Qualifier: Diagnosis of  By: Riccardo Dubin MD, Todd     Diabetes mellitus type 2 in obese ([place]) 06/28/1994   Dyspnea    uses oxygeb 2 liters per minute at dialysis    End stage renal disease on dialysis [facility])    Erosive esophagitis    Esophageal reflux    Eye redness    Gastroparesis    ? diabetic   GERD (gastroesophageal reflux disease)    History of blood transfusion 2019   Human immunodeficiency virus (HIV) disease ([place]) 04/23/2016   Hypertension    Metabolic bone disease [id number]   Moderate nonproliferative diabetic retinopathy of both eyes ([place]) 11/21/2014   11/14/14: Noted on retinal imaging; needs follow-up imaging in 6 months  05/22/16: Noted on retinal imaging again; needs follow-up imaging in 6 months   Type 2 diabetes mellitus with diabetic peripheral angiopathy without gangrene ([place]) 05/01/2019   Wears dentures    lower   Wound infection s/p L transmetatarsal amputation     Past Surgical History:  Procedure Laterality Date   ABDOMINAL AORTOGRAM W/LOWER EXTREMITY N/A 12/13/2020   Procedure: ABDOMINAL AORTOGRAM W/LOWER EXTREMITY;  Surgeon: Angelia Mould, MD;  Location: [place] [facility];  Service: Cardiovascular;  Laterality: N/A;   AMPUTATION Left 07/08/2018   Procedure: AMPUTATION FORTH RAY LEFT FOOT;  Surgeon: [name], MD;  Location: [place];  Service: Orthopedics;  Laterality: Left;   AMPUTATION Left 08/09/2018   Procedure: Left Transmetatarsal Amputation;  Surgeon: [name], MD;  Location: [place];  Service: Orthopedics;  Laterality: Left;   AMPUTATION Left 10/08/2018  Procedure: LEFT BELOW KNEE AMPUTATION;  Surgeon: [name], MD;  Location: [place];  Service: Orthopedics;  Laterality: Left;   AMPUTATION Left 10/28/2018   Procedure: REVISION BELOW KNEE AMPUTATION;  Surgeon: [name], MD;  Location: [place];  Service: Orthopedics;  Laterality: Left;   AMPUTATION Left 12/16/2018   Procedure: LEFT ABOVE KNEE AMPUTATION;  Surgeon: [name], MD;  Location: [place];  Service: Orthopedics;  Laterality: Left;   AMPUTATION Right 01/31/2021   Procedure: AMPUTATION BELOW KNEE;  Surgeon: Serafina Mitchell, MD;   Location: [place];  Service: Vascular;  Laterality: Right;   AMPUTATION Right 03/28/2021   Procedure: REVISION OF RIGHT BELOW KNEE AMPUTATION;  Surgeon: Serafina Mitchell, MD;  Location: [place];  Service: Vascular;  Laterality: Right;   AMPUTATION Right 04/18/2021   Procedure: right below knee amputation/washout placement wound vac;  Surgeon: Cherre Robins, MD;  Location: [place];  Service: Vascular;  Laterality: Right;   AV FISTULA PLACEMENT Left 10/14/2018   Procedure: Arteriovenous (Av) Fistula Creation Left Arm;  Surgeon: [name], MD;  Location: [place];  Service: Vascular;  Laterality: Left;   [place] Left 04/14/2019   Procedure: BASILIC VEIN TRANSPOSITION SECOND STAGE LEFT ARM;  Surgeon: Rosetta Posner, MD;  Location: [place];  Service: Vascular;  Laterality: Left;   INCISION AND DRAINAGE ABSCESS N/A 10/24/2020   Procedure: exicision of hydradenitis;  Surgeon: Leighton Ruff, MD;  Location: [place];  Service: General;  Laterality: N/A;  45 min   INSERTION OF DIALYSIS CATHETER Right 04/14/2019   Procedure: INSERTION OF DIALYSIS CATHETER;  Surgeon: Rosetta Posner, MD;  Location: [place];  Service: Vascular;  Laterality: Right;   LOWER EXTREMITY ANGIOGRAPHY N/A 07/05/2018   Procedure: LOWER EXTREMITY ANGIOGRAPHY;  Surgeon: Serafina Mitchell, MD;  Location: [place] [facility];  Service: Cardiovascular;  Laterality: N/A;   PERIPHERAL VASCULAR ATHERECTOMY Right 12/13/2020   Procedure: PERIPHERAL VASCULAR ATHERECTOMY;  Surgeon: Angelia Mould, MD;  Location: [place] [facility];  Service: Cardiovascular;  Laterality: Right;  Superficial femoral   PERIPHERAL VASCULAR BALLOON ANGIOPLASTY Left 07/05/2018   Procedure: PERIPHERAL VASCULAR BALLOON ANGIOPLASTY;  Surgeon: Serafina Mitchell, MD;  Location: [place] [facility];  Service: Cardiovascular;  Laterality: Left;  SFA   PERIPHERAL VASCULAR BALLOON ANGIOPLASTY Right 12/13/2020   Procedure: PERIPHERAL VASCULAR BALLOON  ANGIOPLASTY;  Surgeon: Angelia Mould, MD;  Location: [place] [facility];  Service: Cardiovascular;  Laterality: Right;  Peroneal artery, anterior tibial artery   RECTAL EXAM UNDER ANESTHESIA N/A 10/24/2020   Procedure: EXAM UNDER ANESTHESIA;  Surgeon: Leighton Ruff, MD;  Location: [facility];  Service: General;  Laterality: N/A;   STUMP REVISION Left 10/19/2018   Procedure: REVISION LEFT BELOW KNEE AMPUTATION;  Surgeon: [name], MD;  Location: [place];  Service: Orthopedics;  Laterality: Left;   TUBAL LIGATION  2002     Home Medications:  Prior to Admission medications   Medication Sig Start Date End Date Taking? Authorizing Provider  albuterol (VENTOLIN HFA) 108 (90 Base) MCG/ACT inhaler Inhale 2 puffs into the lungs every 4 (four) hours as needed for wheezing or shortness of breath. 02/14/21  Yes Angiulli, Lavon Paganini, PA-C  amLODipine (NORVASC) 10 MG tablet Take 1 tablet (10 mg total) by mouth daily. 05/15/21  Yes [name], MD  aspirin EC 81 MG tablet Take 1 tablet (81 mg total) by mouth daily. Swallow whole. 12/17/20  Yes  Angelia Mould, MD  atorvastatin (LIPITOR) 40 MG tablet Take 1 tablet (40 mg total) by mouth daily. 02/14/21  Yes Angiulli, Lavon Paganini, PA-C  bictegravir-emtricitabine-tenofovir AF (BIKTARVY) 50-200-25 MG TABS tablet TAKE 1 TABLET BY MOUTH DAILY. 04/28/21 05/29/21 Yes Michel Bickers, MD  calcitRIOL (ROCALTROL) 0.25 MCG capsule Take 1 capsule (0.25 mcg total) by mouth Every Tuesday,Thursday,and Saturday with dialysis. 04/01/21  Yes Ulyses Amor, PA-C  calcium acetate (PHOSLO) 667 MG capsule Take 3 capsules (2,001 mg total) by mouth 3 (three) times daily with meals. 02/14/21  Yes Angiulli, Lavon Paganini, PA-C  clopidogrel (PLAVIX) 75 MG tablet Take 1 tablet (75 mg total) by mouth daily. 02/14/21  Yes Angiulli, Lavon Paganini, PA-C  cyclobenzaprine (FLEXERIL) 5 MG tablet Take 5 mg by mouth 2 (two) times daily as needed for muscle spasms. 05/15/21  Yes [provider]  gabapentin (NEURONTIN) 100 MG capsule Take 1-3 capsules (100-300 mg total) by mouth See admin instructions. Take 100 mg on Monday, Wednesday, Friday and Sunday Patient taking differently: Take 100 mg by mouth See admin instructions. Take '100mg'$  on Monday, Wednesday, Friday and Sunday 05/02/21 07/31/21 Yes Atway, Rayann N, DO  hydrALAZINE (APRESOLINE) 50 MG tablet Take 1 tablet (50 mg total) by mouth 3 (three) times daily. Take 1 tablet ('50mg'$ ) 3 times daily on NON DIALYSIS DAYS (MWFSun) 05/02/21 06/01/21 Yes Atway, Rayann N, DO  insulin glargine (LANTUS SOLOSTAR) 100 UNIT/ML Solostar Pen Inject 8 Units into the skin at bedtime. Patient taking differently: Inject 10 Units into the skin at bedtime. 02/14/21  Yes Angiulli, Lavon Paganini, PA-C  liraglutide (VICTOZA) 18 MG/3ML SOPN Inject 0.6 mg into the skin daily. 05/02/21 06/01/21 Yes Atway, Rayann N, DO  multivitamin (RENA-VIT) TABS tablet Take 1 tablet by mouth daily. 02/14/21  Yes Angiulli, Lavon Paganini, PA-C  pantoprazole (PROTONIX) 40 MG tablet Take 1 tablet (40 mg total) by mouth daily. 02/14/21  Yes Angiulli, Lavon Paganini, PA-C  polyethylene glycol (MIRALAX / GLYCOLAX) 17 g packet Take 17 g by mouth 2 (two) times daily. Patient taking differently: Take 17 g by mouth daily as needed for mild constipation or moderate constipation. 02/14/21  Yes Angiulli, Lavon Paganini, PA-C  sertraline (ZOLOFT) 50 MG tablet Take 1 tablet (50 mg total) by mouth at bedtime. 02/14/21  Yes Angiulli, Lavon Paganini, PA-C  Accu-Chek Softclix Lancets lancets Use as instructed 05/02/21   Atway, Rayann N, DO  glucose blood test strip Use as instructed 05/02/21   Atway, Rayann N, DO  Insulin Pen Needle (PENTIPS) 32G X 4 MM MISC Use as directed 02/14/21   Meredith Staggers, MD  methocarbamol (ROBAXIN) 500 MG tablet Take 1 tablet (500 mg total) by mouth 4 (four) times daily. Patient not taking: Reported on 05/20/2021 05/05/21   Ulyses Amor, PA-C    Inpatient Medications: Scheduled Meds:   amLODipine  10 mg Oral Daily   aspirin EC  81 mg Oral Daily   atorvastatin  40 mg Oral Daily   bictegravir-emtricitabine-tenofovir AF  1 tablet Oral Daily   calcitRIOL  0.25 mcg Oral Q T,Th,Sa-HD   calcium acetate  2,001 mg Oral TID WC   [START ON 05/21/2021] Chlorhexidine Gluconate Cloth  6 each Topical Q0600   clopidogrel  75 mg Oral Daily   heparin  4,000 Units Intravenous Once   hydrALAZINE  50 mg Oral TID   insulin aspart  0-6 Units Subcutaneous TID WC   insulin glargine  8 Units Subcutaneous QHS   pantoprazole  40  mg Oral Daily   sertraline  50 mg Oral QHS   sodium zirconium cyclosilicate  10 g Oral Daily   Continuous Infusions:  sodium chloride     sodium chloride     heparin     PRN Meds: sodium chloride, sodium chloride, acetaminophen **OR** acetaminophen, alteplase, heparin, heparin, labetalol, lidocaine (PF), lidocaine-prilocaine, pentafluoroprop-tetrafluoroeth, polyethylene glycol  Allergies:   No Known Allergies  Social History:   Social History   Socioeconomic History   Marital status: Single    Spouse name: Not on file   Number of children: Not on file   Years of education: 10   Highest education level: Not on file  Occupational History    Employer: UNEMPLOYED  Tobacco Use   Smoking status: Former    Packs/day: 0.10    Years: 10.00    Pack years: 1.00    Types: Cigarettes    Start date: 11/17/2013    Quit date: 09/09/2014    Years since quitting: 6.6   Smokeless tobacco: Never  Vaping Use   Vaping Use: Never used  Substance and Sexual Activity   Alcohol use: Not Currently   Drug use: Not Currently    Types: Marijuana    Comment: 2 times a monthy -  `last time [place] 2022   Sexual activity: Not Currently    Partners: Female, Female  Other Topics Concern   Not on file  Social History Narrative   Not on file   Social Determinants of Health   Financial Resource Strain: Not on file  Food Insecurity: Not on file  Transportation Needs: Not on file   Physical Activity: Not on file  Stress: Not on file  Social Connections: Not on file  Intimate Partner Violence: Not on file    Family History:    Family History  Problem Relation Age of Onset   Diabetes Mother    Diabetes Brother    Diabetes Daughter    Diabetes Daughter    Mental retardation Brother        died from PNA   Diabetes Maternal Grandmother      ROS:  Please see the history of present illness.   All other ROS reviewed and negative.     Physical Exam/Data:   Vitals:   05/20/21 1430 05/20/21 1506 05/20/21 1511 05/20/21 1517  BP: (!) 196/104 116/73 (!) 164/80 (!) 180/97  Pulse:      Resp: '14 16 16 11  '$ Temp:      TempSrc:      SpO2:      Weight:      Height:       No intake or output data in the 24 hours ending 05/20/21 1541 Last 3 Weights 05/20/2021 05/20/2021 05/02/2021  Weight (lbs) (No Data) 149 lb 14.6 oz 149 lb 14.6 oz  Weight (kg) (No Data) 68 kg 68 kg     Body mass index is 24.95 kg/m.  General:  Well nourished, well developed, in no acute distress HEENT: normal Neck: + JVD Vascular: No carotid bruits; Cardiac:  normal S1, S2; RRR; no murmur  Lungs:  clear to auscultation bilaterally, no wheezing, rhonchi or rales  Abd: soft, nontender, no hepatomegaly  Ext: no edema Musculoskeletal:  Left AKA, Right BKA with wound vac Skin: warm and dry  Neuro:  CNs 2-12 intact, no focal abnormalities noted Psych:  Normal affect   EKG:  The EKG was personally reviewed and demonstrates: Sinus rhythm, 90 bpm, peaked T waves. Telemetry:  Telemetry  was personally reviewed and demonstrates:  SR  Relevant CV Studies:  Echo: 11/2018  IMPRESSIONS     1. The left ventricle has normal systolic function with an ejection  fraction of 60-65%. The cavity size was normal. There is mild concentric  left ventricular hypertrophy. Left ventricular diastolic Doppler  parameters are consistent with  pseudonormalization. Elevated left ventricular end-diastolic  pressure No  evidence of left ventricular regional wall motion abnormalities.   2. The right ventricle has normal systolic function. The cavity was  normal. There is no increase in right ventricular wall thickness.   3. Left atrial size was mildly dilated.   4. The pericardial effusion is localized near the right atrium.   5. Trivial pericardial effusion is present.   6. The aortic valve is tricuspid. Mild sclerosis of the aortic valve.  Aortic valve regurgitation is trivial by color flow Doppler.   FINDINGS   Left Ventricle: The left ventricle has normal systolic function, with an  ejection fraction of 60-65%. The cavity size was normal. There is mild  concentric left ventricular hypertrophy. Left ventricular diastolic  Doppler parameters are consistent with  pseudonormalization. Elevated left ventricular end-diastolic pressure No  evidence of left ventricular regional wall motion abnormalities..  Right Ventricle: The right ventricle has normal systolic function. The  cavity was normal. There is no increase in right ventricular wall  thickness.  Left Atrium: Left atrial size was mildly dilated.  Right Atrium: Right atrial size was normal in size. Right atrial pressure  is estimated at 3 mmHg.  Interatrial Septum: No atrial level shunt detected by color flow Doppler.  Pericardium: Trivial pericardial effusion is present. The pericardial  effusion is localized near the right atrium.  Mitral Valve: The mitral valve is normal in structure. Mitral valve  regurgitation is trivial by color flow Doppler.  Tricuspid Valve: The tricuspid valve is normal in structure. Tricuspid  valve regurgitation is trivial by color flow Doppler.  Aortic Valve: The aortic valve is tricuspid Mild sclerosis of the aortic  valve. Aortic valve regurgitation is trivial by color flow Doppler.  Pulmonic Valve: The pulmonic valve was normal in structure. Pulmonic valve  regurgitation is not visualized by color flow  Doppler.  Venous: The inferior vena cava is normal in size with greater than 50%  respiratory variability.      Laboratory Data:  High Sensitivity Troponin:   Recent Labs  Lab 05/20/21 0700 05/20/21 1048 05/20/21 1214  TROPONINIHS 650* 849* 967*     Chemistry Recent Labs  Lab 05/20/21 0700 05/20/21 1214 05/20/21 1243  NA 140  --  137  K 5.8*  --  5.2*  CL 96*  --   --   CO2 19*  --   --   GLUCOSE 113*  --   --   BUN 68*  --   --   CREATININE 8.85* 9.25*  --   CALCIUM 8.2*  --   --   GFRNONAA 5* 5*  --   ANIONGAP 25*  --   --     Recent Labs  Lab 05/20/21 0700  PROT 9.2*  ALBUMIN 3.3*  AST 15  ALT 9  ALKPHOS 76  BILITOT 1.0   Lipids No results for input(s): CHOL, TRIG, HDL, LABVLDL, LDLCALC, CHOLHDL in the last 168 hours.  Hematology Recent Labs  Lab 05/20/21 0700 05/20/21 1243  WBC 7.7  --   RBC 3.36*  --   HGB 9.9* 11.6*  HCT 33.1* 34.0*  MCV 98.5  --  MCH 29.5  --   MCHC 29.9*  --   RDW 19.8*  --   PLT 385  --    Thyroid No results for input(s): TSH, FREET4 in the last 168 hours.  BNPNo results for input(s): BNP, PROBNP in the last 168 hours.  DDimer No results for input(s): DDIMER in the last 168 hours.   Radiology/Studies:  DG Chest 2 View  Result Date: 05/20/2021 CLINICAL DATA:  Shortness of breath. EXAM: CHEST - 2 VIEW COMPARISON:  01/28/2021 FINDINGS: The heart is within normal limits in size. There is mild tortuosity and age advanced calcification involving the thoracic aorta. The pulmonary hila appear normal. The lungs are clear. No pleural effusions or pulmonary lesions. The bony thorax is intact. IMPRESSION: No acute cardiopulmonary findings. Electronically Signed   By: Marijo Sanes M.D.   On: 05/20/2021 07:29   CT Head Wo Contrast  Result Date: 05/20/2021 CLINICAL DATA:  Headache EXAM: CT HEAD WITHOUT CONTRAST TECHNIQUE: Contiguous axial images were obtained from the base of the skull through the vertex without intravenous  contrast. COMPARISON:  06/06/2019 FINDINGS: Brain: Again noted is area of low attenuation within the right anterior centrum semiovale compatible with old infarct, unchanged since prior study. No acute intracranial abnormality. Specifically, no hemorrhage, hydrocephalus, mass lesion, acute infarction, or significant intracranial injury. Vascular: No hyperdense vessel or unexpected calcification. Skull: No acute calvarial abnormality. Sinuses/Orbits: No acute findings Other: None IMPRESSION: No acute intracranial abnormality. Electronically Signed   By: Rolm Baptise M.D.   On: 05/20/2021 12:12     Assessment and Plan:   NOUF SIPE is a 45 y.o. female with a hx of ESRD on HD, HIV, HTN, DM, CVA, PAD, bilateral leg amps, left AKA, Right BKA with wound vac to the right who is being seen 05/20/2021 for the evaluation of elevated troponin at the request of Dr. Jimmye Norman.  NSTEMI/chest pain: reports sharp centralized chest pain without radiation. Reports no prior episodes of chest pain. HsTn 650>>849>>967. EKG showed SR without acute ST/T wave changes. She does have RFs for CAD and will need further ischemic evaluation. Will plan for cardiac cath tomorrow to define anatomy.  -- continue ASA, plavix, statin and IV heparin  -- add low dose BB post HD -- check echo  Shared Decision Making/Informed Consent The risks [stroke (1 in 1000), death (1 in 1000), kidney failure [usually temporary] (1 in 500), bleeding (1 in 200), allergic reaction [possibly serious] (1 in 200)], benefits (diagnostic support and management of coronary artery disease) and alternatives of a cardiac catheterization were discussed in detail with Ms. Pigg and she is willing to proceed.  Hyperkalemia: K+ 5.8>>5.2 -- undergoing HD  ESRD on HD: has been on HD for the past 2 years -- reports compliance with sessions -- per Nephrology  Headache: severe on admission. CT head negative. Given tylenol without much relief. Suspect related to  poorly controlled HTN  HTN: long standing poorly controlled per notes. Has been on norvasc '10mg'$  daily, and hydralazine '50mg'$  TID -- will add low dose BB and adjust as tolerated  DM: Hgb A1c 7.1 -- SSI while inpatient -- PTA victoza, insulin  PAD s/p bilateral BKAs: recently admitted with Right BKA I&D revision and DC'ed with wound vac -- of note PV angiogram 5/22 noted plaque in the common femoral artery but patent  -- has been on ASA/plavix  HIV: on biktarvy   Risk Assessment/Risk Scores:   TIMI Risk Score for Unstable Angina or Non-ST Elevation MI:  The patient's TIMI risk score is 2, which indicates a 8% risk of all cause mortality, new or recurrent myocardial infarction or need for urgent revascularization in the next 14 days.{     For questions or updates, please contact [place] Please consult [email or web address] for contact info under    Signed, Reino Bellis, NP  05/20/2021 3:41 PM

## 2021-05-20 NOTE — ED Provider Notes (Addendum)
[facility] EMERGENCY DEPARTMENT Provider Note   CSN: [id number] Arrival date & time: 05/20/21  [id number]     History Chief Complaint  Patient presents with   Shortness of Breath    Kathy Frank is a 45 y.o. female who presents this morning with concern for shortness of breath that is progressively worsened since last night.  Additionally woke from her sleep this morning with severe frontal headache subsequently with nausea and vomiting x4 with NBNB emesis.  Headache is mildly improved though she does continue to have headache.  Denies any chest pain, fevers, or chills.  Does still make small amount of urine daily.   I personally read this patient's medical records.  She has history of HIV, diabetes, CKD on dialysis Tuesday/Thursday/Saturday with most recent session Saturday (3 days ago) scheduled for today but not able to make it as she is in the emergency department.  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  She is anticoagulated with aspirin and Plavix.  HPI     Past Medical History:  Diagnosis Date   Anal abscess    chronic   Anxiety    Depression 06/28/2006   Qualifier: Diagnosis of  By: Riccardo Dubin MD, Todd     Diabetes mellitus type 2 in obese ([place]) 06/28/1994   Dyspnea    uses oxygeb 2 liters per minute at dialysis   End stage renal disease on dialysis [facility])    Erosive esophagitis    Esophageal reflux    Eye redness    Gastroparesis    ? diabetic   GERD (gastroesophageal reflux disease)    History of blood transfusion 2019   Human immunodeficiency virus (HIV) disease ([place]) 04/23/2016   Hypertension    Metabolic bone disease [id number]   Moderate nonproliferative diabetic retinopathy of both eyes ([place]) 11/21/2014   11/14/14: Noted on retinal imaging; needs follow-up imaging in 6 months  05/22/16: Noted on retinal imaging again; needs follow-up imaging in 6 months   Type 2 diabetes mellitus with diabetic peripheral angiopathy without  gangrene ([place]) 05/01/2019   Wears dentures    lower   Wound infection s/p L transmetatarsal amputation     Patient Active Problem List   Diagnosis Date Noted   Hypertensive urgency 05/20/2021   BKA stump complication ([place]) [id number]   Infected surgical wound 04/17/2021   PAD (peripheral artery disease) ([place]) 03/28/2021   Right below-knee amputee ([place]) 02/08/2021   Gangrene of right foot ([place]) 01/29/2021   Peripheral arterial disease ([place]) 01/29/2021   Anxiety 05/28/2020   Headache, unspecified 05/02/2020   Patient's other noncompliance with medication regimen 12/28/2019   Carbuncle and furuncle of buttock 12/15/2019   Other hypoglycemia 12/12/2019   Dyspnea 07/01/2019   Iron deficiency anemia, unspecified [id number]   Complication of vascular dialysis catheter 04/24/2019   Major depressive disorder, single episode, unspecified 04/24/2019   Secondary hyperparathyroidism of renal origin ([place]) 04/24/2019   S/P AKA (above knee amputation), left ([place]) 12/16/2018   Anemia due to chronic kidney disease    Moderate protein-calorie malnutrition (HCC)    Adjustment disorder with mixed anxiety and depressed mood 08/15/2018   Wound dehiscence    ESRD (end stage renal disease) ([place])    Fecal incontinence 02/04/2018   Healthcare maintenance 09/17/2017   Aortic atherosclerosis ([place]) 09/30/2016   Diabetic foot ulcer associated with diabetes mellitus due to underlying condition ([place]) 07/08/2016   Human immunodeficiency virus (HIV) disease ([place]) 04/23/2016   Gastroparesis  Moderate nonproliferative diabetic retinopathy of both eyes ([place]) 11/21/2014   Esophageal reflux    Microscopic hematuria 10/31/2014   Hypertension 11/16/2013   Tobacco use disorder 06/28/2006   Depression 06/28/2006   Diabetes mellitus type 2 in obese [facility]) 06/28/1994    Past Surgical History:  Procedure Laterality Date   ABDOMINAL AORTOGRAM W/LOWER EXTREMITY N/A 12/13/2020   Procedure: ABDOMINAL AORTOGRAM W/LOWER  EXTREMITY;  Surgeon: Angelia Mould, MD;  Location: [place] [facility];  Service: Cardiovascular;  Laterality: N/A;   AMPUTATION Left 07/08/2018   Procedure: AMPUTATION FORTH RAY LEFT FOOT;  Surgeon: [name], MD;  Location: [place];  Service: Orthopedics;  Laterality: Left;   AMPUTATION Left 08/09/2018   Procedure: Left Transmetatarsal Amputation;  Surgeon: [name], MD;  Location: [place];  Service: Orthopedics;  Laterality: Left;   AMPUTATION Left 10/08/2018   Procedure: LEFT BELOW KNEE AMPUTATION;  Surgeon: [name], MD;  Location: [place];  Service: Orthopedics;  Laterality: Left;   AMPUTATION Left 10/28/2018   Procedure: REVISION BELOW KNEE AMPUTATION;  Surgeon: [name], MD;  Location: [place];  Service: Orthopedics;  Laterality: Left;   AMPUTATION Left 12/16/2018   Procedure: LEFT ABOVE KNEE AMPUTATION;  Surgeon: [name], MD;  Location: [place];  Service: Orthopedics;  Laterality: Left;   AMPUTATION Right 01/31/2021   Procedure: AMPUTATION BELOW KNEE;  Surgeon: Serafina Mitchell, MD;  Location: [place];  Service: Vascular;  Laterality: Right;   AMPUTATION Right 03/28/2021   Procedure: REVISION OF RIGHT BELOW KNEE AMPUTATION;  Surgeon: Serafina Mitchell, MD;  Location: [place];  Service: Vascular;  Laterality: Right;   AMPUTATION Right 04/18/2021   Procedure: right below knee amputation/washout placement wound vac;  Surgeon: Cherre Robins, MD;  Location: [place];  Service: Vascular;  Laterality: Right;   AV FISTULA PLACEMENT Left 10/14/2018   Procedure: Arteriovenous (Av) Fistula Creation Left Arm;  Surgeon: [name], MD;  Location: [place];  Service: Vascular;  Laterality: Left;   [place] Left 04/14/2019   Procedure: BASILIC VEIN TRANSPOSITION SECOND STAGE LEFT ARM;  Surgeon: Rosetta Posner, MD;  Location: [place];  Service: Vascular;  Laterality: Left;   INCISION AND DRAINAGE ABSCESS N/A 10/24/2020   Procedure: exicision of hydradenitis;  Surgeon:  Leighton Ruff, MD;  Location: [place];  Service: General;  Laterality: N/A;  45 min   INSERTION OF DIALYSIS CATHETER Right 04/14/2019   Procedure: INSERTION OF DIALYSIS CATHETER;  Surgeon: Rosetta Posner, MD;  Location: [place];  Service: Vascular;  Laterality: Right;   LOWER EXTREMITY ANGIOGRAPHY N/A 07/05/2018   Procedure: LOWER EXTREMITY ANGIOGRAPHY;  Surgeon: Serafina Mitchell, MD;  Location: [place] [facility];  Service: Cardiovascular;  Laterality: N/A;   PERIPHERAL VASCULAR ATHERECTOMY Right 12/13/2020   Procedure: PERIPHERAL VASCULAR ATHERECTOMY;  Surgeon: Angelia Mould, MD;  Location: [place] [facility];  Service: Cardiovascular;  Laterality: Right;  Superficial femoral   PERIPHERAL VASCULAR BALLOON ANGIOPLASTY Left 07/05/2018   Procedure: PERIPHERAL VASCULAR BALLOON ANGIOPLASTY;  Surgeon: Serafina Mitchell, MD;  Location: [place] [facility];  Service: Cardiovascular;  Laterality: Left;  SFA   PERIPHERAL VASCULAR BALLOON ANGIOPLASTY Right 12/13/2020   Procedure: PERIPHERAL VASCULAR BALLOON ANGIOPLASTY;  Surgeon: Angelia Mould, MD;  Location: [place] [facility];  Service: Cardiovascular;  Laterality: Right;  Peroneal artery, anterior tibial artery   RECTAL EXAM UNDER ANESTHESIA N/A 10/24/2020   Procedure: EXAM UNDER ANESTHESIA;  Surgeon: Leighton Ruff, MD;  Location: [facility];  Service: General;  Laterality: N/A;   STUMP REVISION Left 10/19/2018   Procedure: REVISION LEFT BELOW KNEE AMPUTATION;  Surgeon: [name], MD;  Location: [place];  Service: Orthopedics;  Laterality: Left;   TUBAL LIGATION  2002     OB History   No obstetric history on file.     Family History  Problem Relation Age of Onset   Diabetes Mother    Diabetes Brother    Diabetes Daughter    Diabetes Daughter    Mental retardation Brother        died from PNA   Diabetes Maternal Grandmother     Social History   Tobacco Use   Smoking status: Former     Packs/day: 0.10    Years: 10.00    Pack years: 1.00    Types: Cigarettes    Start date: 11/17/2013    Quit date: 09/09/2014    Years since quitting: 6.6   Smokeless tobacco: Never  Vaping Use   Vaping Use: Never used  Substance Use Topics   Alcohol use: Not Currently   Drug use: Not Currently    Types: Marijuana    Comment: 2 times a monthy -  `last time [place] 2022    Home Medications Prior to Admission medications   Medication Sig Start Date End Date Taking? Authorizing Provider  albuterol (VENTOLIN HFA) 108 (90 Base) MCG/ACT inhaler Inhale 2 puffs into the lungs every 4 (four) hours as needed for wheezing or shortness of breath. 02/14/21  Yes Angiulli, Lavon Paganini, PA-C  amLODipine (NORVASC) 10 MG tablet Take 1 tablet (10 mg total) by mouth daily. 05/15/21  Yes [name], MD  aspirin EC 81 MG tablet Take 1 tablet (81 mg total) by mouth daily. Swallow whole. 12/17/20  Yes Angelia Mould, MD  atorvastatin (LIPITOR) 40 MG tablet Take 1 tablet (40 mg total) by mouth daily. 02/14/21  Yes Angiulli, Lavon Paganini, PA-C  bictegravir-emtricitabine-tenofovir AF (BIKTARVY) 50-200-25 MG TABS tablet TAKE 1 TABLET BY MOUTH DAILY. 04/28/21 05/29/21 Yes Michel Bickers, MD  calcitRIOL (ROCALTROL) 0.25 MCG capsule Take 1 capsule (0.25 mcg total) by mouth Every Tuesday,Thursday,and Saturday with dialysis. 04/01/21  Yes Ulyses Amor, PA-C  calcium acetate (PHOSLO) 667 MG capsule Take 3 capsules (2,001 mg total) by mouth 3 (three) times daily with meals. 02/14/21  Yes Angiulli, Lavon Paganini, PA-C  clopidogrel (PLAVIX) 75 MG tablet Take 1 tablet (75 mg total) by mouth daily. 02/14/21  Yes Angiulli, Lavon Paganini, PA-C  cyclobenzaprine (FLEXERIL) 5 MG tablet Take 5 mg by mouth 2 (two) times daily as needed for muscle spasms. 05/15/21  Yes [provider]  gabapentin (NEURONTIN) 100 MG capsule Take 1-3 capsules (100-300 mg total) by mouth See admin instructions. Take 100 mg on Monday, Wednesday, Friday and  Sunday Patient taking differently: Take 100 mg by mouth See admin instructions. Take '100mg'$  on Monday, Wednesday, Friday and Sunday 05/02/21 07/31/21 Yes Atway, Rayann N, DO  hydrALAZINE (APRESOLINE) 50 MG tablet Take 1 tablet (50 mg total) by mouth 3 (three) times daily. Take 1 tablet ('50mg'$ ) 3 times daily on NON DIALYSIS DAYS (MWFSun) 05/02/21 06/01/21 Yes Atway, Rayann N, DO  insulin glargine (LANTUS SOLOSTAR) 100 UNIT/ML Solostar Pen Inject 8 Units into the skin at bedtime. Patient taking differently: Inject 10 Units into the skin at bedtime. 02/14/21  Yes Angiulli, Lavon Paganini, PA-C  liraglutide (VICTOZA) 18 MG/3ML SOPN Inject 0.6 mg into  the skin daily. 05/02/21 06/01/21 Yes Atway, Rayann N, DO  multivitamin (RENA-VIT) TABS tablet Take 1 tablet by mouth daily. 02/14/21  Yes Angiulli, Lavon Paganini, PA-C  pantoprazole (PROTONIX) 40 MG tablet Take 1 tablet (40 mg total) by mouth daily. 02/14/21  Yes Angiulli, Lavon Paganini, PA-C  polyethylene glycol (MIRALAX / GLYCOLAX) 17 g packet Take 17 g by mouth 2 (two) times daily. Patient taking differently: Take 17 g by mouth daily as needed for mild constipation or moderate constipation. 02/14/21  Yes Angiulli, Lavon Paganini, PA-C  sertraline (ZOLOFT) 50 MG tablet Take 1 tablet (50 mg total) by mouth at bedtime. 02/14/21  Yes Angiulli, Lavon Paganini, PA-C  Accu-Chek Softclix Lancets lancets Use as instructed 05/02/21   Atway, Rayann N, DO  glucose blood test strip Use as instructed 05/02/21   Atway, Rayann N, DO  Insulin Pen Needle (PENTIPS) 32G X 4 MM MISC Use as directed 02/14/21   Meredith Staggers, MD  methocarbamol (ROBAXIN) 500 MG tablet Take 1 tablet (500 mg total) by mouth 4 (four) times daily. Patient not taking: Reported on 05/20/2021 05/05/21   Ulyses Amor, PA-C    Allergies    Patient has no known allergies.  Review of Systems   Review of Systems  Constitutional: Negative.   HENT: Negative.    Eyes:  Negative for photophobia and visual disturbance.  Respiratory:   Positive for shortness of breath. Negative for cough.   Cardiovascular: Negative.   Gastrointestinal: Negative.   Genitourinary: Negative.   Musculoskeletal: Negative.   Skin:  Positive for wound.  Neurological:  Positive for weakness and headaches. Negative for syncope.   Physical Exam Updated Vital Signs BP (!) 198/95   Pulse (!) 112   Temp 98.4 F (36.9 C) (Oral)   Resp 14   Ht '5\' 5"'$  (1.651 m)   Wt 68 kg   SpO2 96%   BMI 24.95 kg/m   Physical Exam Vitals and nursing note reviewed.  Constitutional:      Appearance: She is not toxic-appearing.  HENT:     Head: Normocephalic and atraumatic.     Nose: Nose normal.     Mouth/Throat:     Mouth: Mucous membranes are moist.     Pharynx: Oropharynx is clear. Uvula midline. No oropharyngeal exudate or posterior oropharyngeal erythema.     Tonsils: No tonsillar exudate.  Eyes:     General: Lids are normal. Vision grossly intact. No visual field deficit.       Right eye: No discharge.        Left eye: No discharge.     Extraocular Movements: Extraocular movements intact.     Conjunctiva/sclera: Conjunctivae normal.     Pupils: Pupils are equal, round, and reactive to light.  Neck:     Trachea: Trachea and phonation normal.  Cardiovascular:     Rate and Rhythm: Normal rate and regular rhythm.     Pulses: Normal pulses.           Right dorsalis pedis pulse not accessible and left dorsalis pedis pulse not accessible.        Right posterior tibial pulse not accessible and left posterior tibial pulse not accessible.     Heart sounds: Normal heart sounds. No murmur heard.    Comments: Palpable thrill in the left upper arm with active AV fistula. Pulmonary:     Effort: Pulmonary effort is normal. No tachypnea, bradypnea, accessory muscle usage, prolonged expiration or respiratory distress.  Breath sounds: Normal breath sounds. No wheezing or rales.  Chest:     Chest wall: No mass, lacerations, deformity, swelling, tenderness,  crepitus or edema.  Abdominal:     General: Bowel sounds are normal. There is no distension.     Palpations: Abdomen is soft.     Tenderness: There is no abdominal tenderness. There is no right CVA tenderness, left CVA tenderness, guarding or rebound.  Musculoskeletal:        General: No deformity.     Cervical back: Normal range of motion and neck supple. No edema, rigidity or crepitus. No pain with movement, spinous process tenderness or muscular tenderness.     Right lower leg: No edema.     Left lower leg: No edema.     Comments: Left BKA and right AKA with wound VAC in place without purulent drainage, erythema, or induration  Skin:    General: Skin is warm and dry.     Capillary Refill: Capillary refill takes less than 2 seconds.  Neurological:     General: No focal deficit present.     Mental Status: She is alert and oriented to person, place, and time. Mental status is at baseline.     GCS: GCS eye subscore is 4. GCS verbal subscore is 5. GCS motor subscore is 6.     Cranial Nerves: Cranial nerve deficit and facial asymmetry present. No dysarthria.     Sensory: Sensation is intact.     Motor: Motor function is intact.     Coordination: Coordination is intact.     Comments: Right-sided mouth droop and flattening of the nasolabial fold with sustained smile.  Patient states the right side of her face feels weak.  Neurologic exam otherwise nonfocal.  Psychiatric:        Mood and Affect: Mood normal.    ED Results / Procedures / Treatments   Labs (all labs ordered are listed, but only abnormal results are displayed) Labs Reviewed  CBC WITH DIFFERENTIAL/PLATELET - Abnormal; Notable for the following components:      Result Value   RBC 3.36 (*)    Hemoglobin 9.9 (*)    HCT 33.1 (*)    MCHC 29.9 (*)    RDW 19.8 (*)    nRBC 0.3 (*)    Eosinophils Absolute 0.6 (*)    All other components within normal limits  BASIC METABOLIC PANEL - Abnormal; Notable for the following  components:   Potassium 5.8 (*)    Chloride 96 (*)    CO2 19 (*)    Glucose, Bld 113 (*)    BUN 68 (*)    Creatinine, Ser 8.85 (*)    Calcium 8.2 (*)    GFR, Estimated 5 (*)    Anion gap 25 (*)    All other components within normal limits  HEPATIC FUNCTION PANEL - Abnormal; Notable for the following components:   Total Protein 9.2 (*)    Albumin 3.3 (*)    All other components within normal limits  I-STAT VENOUS BLOOD GAS, ED - Abnormal; Notable for the following components:   pCO2, Ven 41.3 (*)    pO2, Ven 47.0 (*)    Acid-base deficit 3.0 (*)    Potassium 5.2 (*)    Calcium, Ion 0.84 (*)    HCT 34.0 (*)    Hemoglobin 11.6 (*)    All other components within normal limits  TROPONIN I (HIGH SENSITIVITY) - Abnormal; Notable for the following components:   Troponin  I (High Sensitivity) 650 (*)    All other components within normal limits  TROPONIN I (HIGH SENSITIVITY) - Abnormal; Notable for the following components:   Troponin I (High Sensitivity) 849 (*)    All other components within normal limits  RESP PANEL BY RT-PCR (FLU A&B, COVID) ARPGX2  LIPASE, BLOOD  BLOOD GAS, VENOUS  CREATININE, SERUM  PROTIME-INR  APTT  HEPARIN LEVEL (UNFRACTIONATED)  TROPONIN I (HIGH SENSITIVITY)    EKG EKG Interpretation  Date/Time:  Tuesday May 20 2021 10:46:22 EDT Ventricular Rate:  90 PR Interval:  153 QRS Duration: 94 QT Interval:  425 QTC Calculation: 521 R Axis:   105 Text Interpretation: Sinus rhythm Probable left atrial enlargement Anterior infarct, old Prolonged QT interval Confirmed by Lacretia Leigh ([id number]) on 05/20/2021 11:08:06 AM  Radiology DG Chest 2 View  Result Date: 05/20/2021 CLINICAL DATA:  Shortness of breath. EXAM: CHEST - 2 VIEW COMPARISON:  01/28/2021 FINDINGS: The heart is within normal limits in size. There is mild tortuosity and age advanced calcification involving the thoracic aorta. The pulmonary hila appear normal. The lungs are clear. No pleural  effusions or pulmonary lesions. The bony thorax is intact. IMPRESSION: No acute cardiopulmonary findings. Electronically Signed   By: Marijo Sanes M.D.   On: 05/20/2021 07:29   CT Head Wo Contrast  Result Date: 05/20/2021 CLINICAL DATA:  Headache EXAM: CT HEAD WITHOUT CONTRAST TECHNIQUE: Contiguous axial images were obtained from the base of the skull through the vertex without intravenous contrast. COMPARISON:  06/06/2019 FINDINGS: Brain: Again noted is area of low attenuation within the right anterior centrum semiovale compatible with old infarct, unchanged since prior study. No acute intracranial abnormality. Specifically, no hemorrhage, hydrocephalus, mass lesion, acute infarction, or significant intracranial injury. Vascular: No hyperdense vessel or unexpected calcification. Skull: No acute calvarial abnormality. Sinuses/Orbits: No acute findings Other: None IMPRESSION: No acute intracranial abnormality. Electronically Signed   By: Rolm Baptise M.D.   On: 05/20/2021 12:12    Procedures .Critical Care Performed by: Emeline Darling, PA-C Authorized by: Emeline Darling, PA-C   Critical care provider statement:    Critical care time (minutes):  45   Critical care time was exclusive of:  Teaching time   Critical care was necessary to treat or prevent imminent or life-threatening deterioration of the following conditions: NSTEMI.   Critical care was time spent personally by me on the following activities:  Ordering and performing treatments and interventions, ordering and review of laboratory studies, ordering and review of radiographic studies, discussions with consultants, development of treatment plan with patient or surrogate, evaluation of patient's response to treatment, examination of patient and obtaining history from patient or surrogate   Medications Ordered in ED Medications  sodium zirconium cyclosilicate (LOKELMA) packet 10 g (10 g Oral Given 05/20/21 1125)   bictegravir-emtricitabine-tenofovir AF (BIKTARVY) 50-200-25 MG per tablet 1 tablet (has no administration in time range)  amLODipine (NORVASC) tablet 10 mg (has no administration in time range)  atorvastatin (LIPITOR) tablet 40 mg (has no administration in time range)  hydrALAZINE (APRESOLINE) tablet 50 mg (has no administration in time range)  sertraline (ZOLOFT) tablet 50 mg (has no administration in time range)  calcitRIOL (ROCALTROL) capsule 0.25 mcg (has no administration in time range)  insulin glargine (LANTUS) Solostar Pen 8 Units (has no administration in time range)  calcium acetate (PHOSLO) capsule 2,001 mg (has no administration in time range)  pantoprazole (PROTONIX) EC tablet 40 mg (has no administration in time range)  polyethylene  glycol (MIRALAX / GLYCOLAX) packet 17 g (has no administration in time range)  acetaminophen (TYLENOL) tablet 650 mg (has no administration in time range)    Or  acetaminophen (TYLENOL) suppository 650 mg (has no administration in time range)  clopidogrel (PLAVIX) tablet 75 mg (has no administration in time range)  Chlorhexidine Gluconate Cloth 2 % PADS 6 each (has no administration in time range)  labetalol (NORMODYNE) injection 10 mg (has no administration in time range)  insulin aspart (novoLOG) injection 0-6 Units (has no administration in time range)  heparin ADULT infusion 100 units/mL (25000 units/252m) (has no administration in time range)  heparin bolus via infusion 4,000 Units (has no administration in time range)  insulin aspart (novoLOG) injection 5 Units (5 Units Intravenous Given 05/20/21 1124)    And  dextrose 50 % solution 50 mL (50 mLs Intravenous Given 05/20/21 1120)    ED Course  I have reviewed the triage vital signs and the nursing notes.  Pertinent labs & imaging results that were available during my care of the patient were reviewed by me and considered in my medical decision making (see chart for details).  Clinical  Course as of 05/27/21 1339  Tue May 20, 2021  1044 Consult call received from internal medicine service, Dr. CDarrick Meigs who states that this patient is a patient of their clinic and they would like to see her in the emergency department; discussed that with her presentation of apparent hypertensive emergency, I do feel she would require admission to the hospital.  They will present to the emergency department to evaluate her.  Cardiology and nephrology consults pending at this time.  We will hold off on heparinization at this time given concern for possible hypertensive bleed with thunderclap headache this morning, subsequent nausea and vomiting, and abnormal cranial nerve exam. [RS]  1050 Consult to cardiology coordinator Trish, who will add this patient to their list to be evaluated for apparent NSTEMI.  She was made aware that there is no heparin being ministered at this time as concern for possible intracranial bleed. [RS]  [id number] to nephrology, Dr. SCandiss Norse [facility] with [facility] on [facility].  He will have a PA evaluate the patient in the emergency department and will had her to the list for dialysis today pending completion of her ED work-up.  I appreciate his collaboration of care of this patient. [RS]    Clinical Course User Index [RS] Amey Hossain, RSharlene Dory  MDM Rules/Calculators/A&P                         45year old female with history of ESRD on dialysis, HIV, and diabetes who presents with shortness of breath and thunderclap headache this morning that woke her from her sleep with associated nausea and vomiting.  Concern for possible intracranial bleed whether that be from hypertension or spontaneous subarachnoid hemorrhage to explain headache, nausea, and vomiting.  Regarding shortness of breath differential diagnosis includes limited to ACS, PE, pleural effusion, pneumothorax, pneumonia, sepsis, metabolic derangement.  Vital signs on intake  revealed significant hypertension.  Otherwise unremarkable.  Cardiopulmonary exam is normal, abdominal exam is benign.  Patient is neurovascularly intact in upper extremities with left BKA with healthy-appearing stump and right AKA with wound VAC on stump without signs of acute cellulitis.  At time of my evaluation of the patient, much of her laboratory studies have returned as ordered from triage.CBC with anemia at  baseline with hemoglobin of 9.9.  BMP with hyperkalemia 5.8, some peaked T waves on her EKG without ischemic changes..  (Will administer Lokelma).  Creatinine at patient's baseline 8.8 today.  Lipase is normal.  Rest for pathogen panel is negative.  Troponin was significantly elevated to 650.  Given concern for possible intracranial hemorrhage with thunderclap headache with neurologic deficit on physical exam, will hold off on heparinization for NSTEMI at this time.  Cardiology and nephrology have been consulted; internal medicine has admitted the patient to their service that she follows with them in the outpatient setting.  We will continue to trend troponin but will hold off on heparin until normal head CT.  At this point patient has been admitted to the hospital.  CT head negative for acute intracranial abnormality.  Heparin has been ordered by admitting team.  No further ED management required at this time.  We remain available should she acutely decompensate in her department.  Amiylah voiced understanding of her medical evaluation and treatment plan.  Each of her questions was answered to her expressed satisfaction.  She is amenable plan for admission at this time.  This chart was dictated using voice recognition software, Dragon. Despite the best efforts of this provider to proofread and correct errors, errors may still occur which can change documentation meaning.  Final Clinical Impression(s) / ED Diagnoses Final diagnoses:  Shortness of breath    Rx / DC Orders ED Discharge  Orders     None        Aura Dials 05/20/21 1259    Lacretia Leigh, MD 05/23/21 0736    Tanuj Mullens, Gypsy Balsam, PA-C 05/27/21 1340    Lacretia Leigh, MD 05/29/21 2230

## 2021-05-20 NOTE — Consult Note (Addendum)
[facility] Renal Consultation Note    Indication for Consultation:  Management of ESRD/hemodialysis; anemia, hypertension/volume and secondary hyperparathyroidism  HPI: Kathy Frank is a 45 y.o. female with ESRD on HD, DM, HTN, HIV, s/p L AKA and R BKA. She is admitted under observation status for evaluation of headache and dyspnea. Reports severe headache that woke her from sleep this am. Also reported sob and chest pain on arrival but has improved at present. Hypertensive in the ED. Per primary team --plans for head CT.   ED findings notable for K 5.3, HS Troponin 650>849. CXR clear.   Dialysis TTS at [facility]. Dialysis via LUE AVF.  Due for routine dialysis today. Has been compliant with treatments. We have been gradually lowering her outpatient dry weight.   Past Medical History:  Diagnosis Date   Anal abscess    chronic   Anxiety    Depression 06/28/2006   Qualifier: Diagnosis of  By: Riccardo Dubin MD, Todd     Diabetes mellitus type 2 in obese ([place]) 06/28/1994   Dyspnea    uses oxygeb 2 liters per minute at dialysis   End stage renal disease on dialysis [facility])    Erosive esophagitis    Esophageal reflux    Eye redness    Gastroparesis    ? diabetic   GERD (gastroesophageal reflux disease)    History of blood transfusion 2019   Human immunodeficiency virus (HIV) disease ([place]) 04/23/2016   Hypertension    Metabolic bone disease [id number]   Moderate nonproliferative diabetic retinopathy of both eyes ([place]) 11/21/2014   11/14/14: Noted on retinal imaging; needs follow-up imaging in 6 months  05/22/16: Noted on retinal imaging again; needs follow-up imaging in 6 months   Type 2 diabetes mellitus with diabetic peripheral angiopathy without gangrene ([place]) 05/01/2019   Wears dentures    lower   Wound infection s/p L transmetatarsal amputation    Past Surgical History:  Procedure Laterality Date   ABDOMINAL AORTOGRAM W/LOWER EXTREMITY N/A 12/13/2020    Procedure: ABDOMINAL AORTOGRAM W/LOWER EXTREMITY;  Surgeon: Angelia Mould, MD;  Location: [place] CV LAB;  Service: Cardiovascular;  Laterality: N/A;   AMPUTATION Left 07/08/2018   Procedure: AMPUTATION FORTH RAY LEFT FOOT;  Surgeon: [name], MD;  Location: [place];  Service: Orthopedics;  Laterality: Left;   AMPUTATION Left 08/09/2018   Procedure: Left Transmetatarsal Amputation;  Surgeon: [name], MD;  Location: [place];  Service: Orthopedics;  Laterality: Left;   AMPUTATION Left 10/08/2018   Procedure: LEFT BELOW KNEE AMPUTATION;  Surgeon: [name], MD;  Location: [place];  Service: Orthopedics;  Laterality: Left;   AMPUTATION Left 10/28/2018   Procedure: REVISION BELOW KNEE AMPUTATION;  Surgeon: [name], MD;  Location: [place];  Service: Orthopedics;  Laterality: Left;   AMPUTATION Left 12/16/2018   Procedure: LEFT ABOVE KNEE AMPUTATION;  Surgeon: [name], MD;  Location: [place];  Service: Orthopedics;  Laterality: Left;   AMPUTATION Right 01/31/2021   Procedure: AMPUTATION BELOW KNEE;  Surgeon: Serafina Mitchell, MD;  Location: [facility] OR;  Service: Vascular;  Laterality: Right;   AMPUTATION Right 03/28/2021   Procedure: REVISION OF RIGHT BELOW KNEE AMPUTATION;  Surgeon: Serafina Mitchell, MD;  Location: [place];  Service: Vascular;  Laterality: Right;   AMPUTATION Right 04/18/2021   Procedure: right below knee amputation/washout placement wound vac;  Surgeon: Cherre Robins, MD;  Location: [place];  Service: Vascular;  Laterality: Right;  AV FISTULA PLACEMENT Left 10/14/2018   Procedure: Arteriovenous (Av) Fistula Creation Left Arm;  Surgeon: [name], MD;  Location: [place];  Service: Vascular;  Laterality: Left;   [place] Left 04/14/2019   Procedure: BASILIC VEIN TRANSPOSITION SECOND STAGE LEFT ARM;  Surgeon: Rosetta Posner, MD;  Location: [place];  Service: Vascular;  Laterality: Left;   INCISION AND DRAINAGE ABSCESS N/A 10/24/2020    Procedure: exicision of hydradenitis;  Surgeon: Leighton Ruff, MD;  Location: [place];  Service: General;  Laterality: N/A;  45 min   INSERTION OF DIALYSIS CATHETER Right 04/14/2019   Procedure: INSERTION OF DIALYSIS CATHETER;  Surgeon: Rosetta Posner, MD;  Location: [place];  Service: Vascular;  Laterality: Right;   LOWER EXTREMITY ANGIOGRAPHY N/A 07/05/2018   Procedure: LOWER EXTREMITY ANGIOGRAPHY;  Surgeon: Serafina Mitchell, MD;  Location: [place] CV LAB;  Service: Cardiovascular;  Laterality: N/A;   PERIPHERAL VASCULAR ATHERECTOMY Right 12/13/2020   Procedure: PERIPHERAL VASCULAR ATHERECTOMY;  Surgeon: Angelia Mould, MD;  Location: [place] CV LAB;  Service: Cardiovascular;  Laterality: Right;  Superficial femoral   PERIPHERAL VASCULAR BALLOON ANGIOPLASTY Left 07/05/2018   Procedure: PERIPHERAL VASCULAR BALLOON ANGIOPLASTY;  Surgeon: Serafina Mitchell, MD;  Location: [place] CV LAB;  Service: Cardiovascular;  Laterality: Left;  SFA   PERIPHERAL VASCULAR BALLOON ANGIOPLASTY Right 12/13/2020   Procedure: PERIPHERAL VASCULAR BALLOON ANGIOPLASTY;  Surgeon: Angelia Mould, MD;  Location: [place] CV LAB;  Service: Cardiovascular;  Laterality: Right;  Peroneal artery, anterior tibial artery   RECTAL EXAM UNDER ANESTHESIA N/A 10/24/2020   Procedure: EXAM UNDER ANESTHESIA;  Surgeon: Leighton Ruff, MD;  Location: [facility];  Service: General;  Laterality: N/A;   STUMP REVISION Left 10/19/2018   Procedure: REVISION LEFT BELOW KNEE AMPUTATION;  Surgeon: [name], MD;  Location: [place];  Service: Orthopedics;  Laterality: Left;   TUBAL LIGATION  2002   Family History  Problem Relation Age of Onset   Diabetes Mother    Diabetes Brother    Diabetes Daughter    Diabetes Daughter    Mental retardation Brother        died from PNA   Diabetes Maternal Grandmother    Social History:  reports that she quit smoking about 6 years ago. Her  smoking use included cigarettes. She started smoking about 7 years ago. She has a 1.00 pack-year smoking history. She has never used smokeless tobacco. She reports that she does not currently use alcohol. She reports that she does not currently use drugs after having used the following drugs: Marijuana. No Known Allergies Prior to Admission medications   Medication Sig Start Date End Date Taking? Authorizing Provider  Accu-Chek Softclix Lancets lancets Use as instructed 05/02/21   Atway, Rayann N, DO  albuterol (VENTOLIN HFA) 108 (90 Base) MCG/ACT inhaler Inhale 2 puffs into the lungs every 4 (four) hours as needed for wheezing or shortness of breath. 02/14/21   Angiulli, Lavon Paganini, PA-C  amLODipine (NORVASC) 10 MG tablet Take 1 tablet (10 mg total) by mouth daily. 05/15/21   [name], MD  aspirin EC 81 MG tablet Take 1 tablet (81 mg total) by mouth daily. Swallow whole. 12/17/20   Angelia Mould, MD  atorvastatin (LIPITOR) 40 MG tablet Take 1 tablet (40 mg total) by mouth daily. 02/14/21   Angiulli, Lavon Paganini, PA-C  bictegravir-emtricitabine-tenofovir AF (BIKTARVY) 50-200-25 MG TABS tablet TAKE 1 TABLET BY MOUTH DAILY. 04/28/21  05/29/21  Michel Bickers, MD  calcitRIOL (ROCALTROL) 0.25 MCG capsule Take 1 capsule (0.25 mcg total) by mouth Every Tuesday,Thursday,and Saturday with dialysis. 04/01/21   Ulyses Amor, PA-C  calcium acetate (PHOSLO) 667 MG capsule Take 3 capsules (2,001 mg total) by mouth 3 (three) times daily with meals. 02/14/21   Angiulli, Lavon Paganini, PA-C  clopidogrel (PLAVIX) 75 MG tablet Take 1 tablet (75 mg total) by mouth daily. 02/14/21   Angiulli, Lavon Paganini, PA-C  gabapentin (NEURONTIN) 100 MG capsule Take 1-3 capsules (100-300 mg total) by mouth See admin instructions. Take 100 mg on Monday, Wednesday, Friday and Sunday 05/02/21 07/31/21  Atway, Rayann N, DO  glucose blood test strip Use as instructed 05/02/21   Atway, Rayann N, DO  hydrALAZINE (APRESOLINE) 50 MG tablet Take 1 tablet  (50 mg total) by mouth 3 (three) times daily. Take 1 tablet ('50mg'$ ) 3 times daily on NON DIALYSIS DAYS (MWFSun) 05/02/21 06/01/21  Atway, Rayann N, DO  insulin glargine (LANTUS SOLOSTAR) 100 UNIT/ML Solostar Pen Inject 8 Units into the skin at bedtime. Patient taking differently: Inject 10 Units into the skin at bedtime. 02/14/21   Angiulli, Lavon Paganini, PA-C  Insulin Pen Needle (PENTIPS) 32G X 4 MM MISC Use as directed 02/14/21   Meredith Staggers, MD  liraglutide (VICTOZA) 18 MG/3ML SOPN Inject 0.6 mg into the skin daily. 05/02/21 06/01/21  Atway, Jeananne Rama, DO  methocarbamol (ROBAXIN) 500 MG tablet Take 1 tablet (500 mg total) by mouth 4 (four) times daily. 05/05/21   Ulyses Amor, PA-C  multivitamin (RENA-VIT) TABS tablet Take 1 tablet by mouth daily. 02/14/21   Angiulli, Lavon Paganini, PA-C  pantoprazole (PROTONIX) 40 MG tablet Take 1 tablet (40 mg total) by mouth daily. 02/14/21   Angiulli, Lavon Paganini, PA-C  polyethylene glycol (MIRALAX / GLYCOLAX) 17 g packet Take 17 g by mouth 2 (two) times daily. Patient taking differently: Take 17 g by mouth daily as needed for mild constipation or moderate constipation. 02/14/21   Angiulli, Lavon Paganini, PA-C  sertraline (ZOLOFT) 50 MG tablet Take 1 tablet (50 mg total) by mouth at bedtime. 02/14/21   Angiulli, Lavon Paganini, PA-C   Current Facility-Administered Medications  Medication Dose Route Frequency Provider Last Rate Last Admin   sodium zirconium cyclosilicate (LOKELMA) packet 10 g  10 g Oral Daily Sponseller, Rebekah R, PA-C   10 g at 05/20/21 1125   Current Outpatient Medications  Medication Sig Dispense Refill   Accu-Chek Softclix Lancets lancets Use as instructed 100 each 12   albuterol (VENTOLIN HFA) 108 (90 Base) MCG/ACT inhaler Inhale 2 puffs into the lungs every 4 (four) hours as needed for wheezing or shortness of breath. 8.5 g 0   amLODipine (NORVASC) 10 MG tablet Take 1 tablet (10 mg total) by mouth daily. 90 tablet 3   aspirin EC 81 MG tablet Take 1 tablet (81 mg  total) by mouth daily. Swallow whole. 90 tablet 3   atorvastatin (LIPITOR) 40 MG tablet Take 1 tablet (40 mg total) by mouth daily. 90 tablet 3   bictegravir-emtricitabine-tenofovir AF (BIKTARVY) 50-200-25 MG TABS tablet TAKE 1 TABLET BY MOUTH DAILY. 30 tablet 0   calcitRIOL (ROCALTROL) 0.25 MCG capsule Take 1 capsule (0.25 mcg total) by mouth Every Tuesday,Thursday,and Saturday with dialysis. 30 capsule 0   calcium acetate (PHOSLO) 667 MG capsule Take 3 capsules (2,001 mg total) by mouth 3 (three) times daily with meals. 180 capsule 0   clopidogrel (PLAVIX) 75 MG tablet Take 1  tablet (75 mg total) by mouth daily. 90 tablet 3   gabapentin (NEURONTIN) 100 MG capsule Take 1-3 capsules (100-300 mg total) by mouth See admin instructions. Take 100 mg on Monday, Wednesday, Friday and Sunday 30 capsule 2   glucose blood test strip Use as instructed 100 each 12   hydrALAZINE (APRESOLINE) 50 MG tablet Take 1 tablet (50 mg total) by mouth 3 (three) times daily. Take 1 tablet ('50mg'$ ) 3 times daily on NON DIALYSIS DAYS (MWFSun) 90 tablet 0   insulin glargine (LANTUS SOLOSTAR) 100 UNIT/ML Solostar Pen Inject 8 Units into the skin at bedtime. (Patient taking differently: Inject 10 Units into the skin at bedtime.) 15 mL 11   Insulin Pen Needle (PENTIPS) 32G X 4 MM MISC Use as directed 100 each 3   liraglutide (VICTOZA) 18 MG/3ML SOPN Inject 0.6 mg into the skin daily. 3 mL 0   methocarbamol (ROBAXIN) 500 MG tablet Take 1 tablet (500 mg total) by mouth 4 (four) times daily. 30 tablet 0   multivitamin (RENA-VIT) TABS tablet Take 1 tablet by mouth daily. 30 tablet 0   pantoprazole (PROTONIX) 40 MG tablet Take 1 tablet (40 mg total) by mouth daily. 30 tablet 0   polyethylene glycol (MIRALAX / GLYCOLAX) 17 g packet Take 17 g by mouth 2 (two) times daily. (Patient taking differently: Take 17 g by mouth daily as needed for mild constipation or moderate constipation.) 14 each 0   sertraline (ZOLOFT) 50 MG tablet Take 1  tablet (50 mg total) by mouth at bedtime. 30 tablet 0     ROS: As per HPI otherwise negative.  Physical Exam: Vitals:   05/20/21 0652 05/20/21 0949  BP: (!) 195/97 (!) 207/98  Pulse: 90 94  Resp: 18 16  Temp: 98.4 F (36.9 C)   TempSrc: Oral   SpO2: 96% 97%     General: Chronically ill appearing, nad  Head: NCAT sclera not icteric MMM Neck: Supple. No JVD appreciated  Lungs: CTA bilaterally without wheezes, rales, or rhonchi. Breathing is unlabored. Heart: RRR with S1 S2 Abdomen: soft non-tender  Lower extremities: bilateral BKAs No sig edema  Neuro: A & O  X 3. Moves all extremities spontaneously. Psych:  Responds to questions appropriately with a normal affect. Dialysis Access: LUE AVF+bruit   Labs: Basic Metabolic Panel: Recent Labs  Lab 05/20/21 0700  NA 140  K 5.8*  CL 96*  CO2 19*  GLUCOSE 113*  BUN 68*  CREATININE 8.85*  CALCIUM 8.2*   Liver Function Tests: Recent Labs  Lab 05/20/21 0700  AST 15  ALT 9  ALKPHOS 76  BILITOT 1.0  PROT 9.2*  ALBUMIN 3.3*   Recent Labs  Lab 05/20/21 0700  LIPASE 38   No results for input(s): AMMONIA in the last 168 hours. CBC: Recent Labs  Lab 05/20/21 0700  WBC 7.7  NEUTROABS 4.6  HGB 9.9*  HCT 33.1*  MCV 98.5  PLT 385   Cardiac Enzymes: No results for input(s): CKTOTAL, CKMB, CKMBINDEX, TROPONINI in the last 168 hours. CBG: No results for input(s): GLUCAP in the last 168 hours. Iron Studies: No results for input(s): IRON, TIBC, TRANSFERRIN, FERRITIN in the last 72 hours. Studies/Results: DG Chest 2 View  Result Date: 05/20/2021 CLINICAL DATA:  Shortness of breath. EXAM: CHEST - 2 VIEW COMPARISON:  01/28/2021 FINDINGS: The heart is within normal limits in size. There is mild tortuosity and age advanced calcification involving the thoracic aorta. The pulmonary hila appear normal. The lungs are  clear. No pleural effusions or pulmonary lesions. The bony thorax is intact. IMPRESSION: No acute  cardiopulmonary findings. Electronically Signed   By: Marijo Sanes M.D.   On: 05/20/2021 07:29    Dialysis Orders:  [facility] TTS 4h  400/A1.5x EDW  67.5kg 2K/2Ca  AVF Heparin 3000 U  -Venofer 100 mg  x 10 (until 10/15)  -Mircera 150 q 2 weeks  (last 10/6)  -Calcitriol 0.25 TIW   Assessment/Plan: ESRD -  HD TTS. K+5.8. Will plan for dialysis on schedule today -likely later this evening. Lokelma ordered today.  Headache - Per primary.  Hypertension/volume  - Continue home HTN meds.  HD today -continue to challenge EDW as tolerated.  Anemia  - Hgb 9.9 . On ESA/Fe load as outpatient.  Metabolic bone disease -  Continue home binders.  Nutrition - Renal diet with fluid restriction.   Lynnda Child PA-C [facility] 05/20/2021, 11:38 AM

## 2021-05-20 NOTE — ED Provider Notes (Addendum)
Emergency Medicine Provider Triage Evaluation Note  Kathy Frank , a 45 y.o. female  was evaluated in triage.   Patient reports that she woke up this morning with headache, chest pain and shortness of breath.  Symptoms were initially severe but since waiting in the emergency department she reports both her shortness of breath and chest pain has improved but she has a lingering right-sided headache.  Patient reports that her last dialysis session was on Saturday and she is due for dialysis again today.  She reports intermittent nausea without vomiting as well as intermittent nonbloody diarrhea.  Review of Systems  Positive: Headache.  Chest pain/shortness of breath improved.  Nausea, diarrhea. Negative: Fever, chills, fall, injury, vision changes, extremity swelling/color change, abdominal pain active chest pain or any additional concerns.  Physical Exam  BP (!) 195/97   Pulse 90   Temp 98.4 F (36.9 C) (Oral)   Resp 18   SpO2 96%  Gen:   Awake, no distress   Resp:  Normal effort, lungs clear to auscultation bilaterally MSK:   Moves extremities without difficulty  Other:  Heart regular rate and rhythm.  Medical Decision Making  Medically screening exam initiated at 8:37 AM.  Appropriate orders placed.  [name] was informed that the remainder of the evaluation will be completed by another provider, this initial triage assessment does not replace that evaluation, and the importance of remaining in the ED until their evaluation is complete.  I have asked charge nurse to move patient into room as soon as available.  Note: Portions of this report may have been transcribed using voice recognition software. Every effort was made to ensure accuracy; however, inadvertent computerized transcription errors may still be present.    Deliah Boston, PA-C 05/20/21 0839    Deliah Boston, PA-C 05/20/21 [id number]    Teressa Lower, MD 05/20/21 1558

## 2021-05-20 NOTE — Progress Notes (Signed)
ANTICOAGULATION CONSULT NOTE - Initial Consult  Pharmacy Consult for heparin Indication: chest pain/ACS  No Known Allergies  Patient Measurements: Height: '5\' 5"'$  (165.1 cm) Weight: 68 kg (149 lb 14.6 oz) IBW/kg (Calculated) : 57 Heparin Dosing Weight: 68 kg  Vital Signs: Temp: 98.4 F (36.9 C) (10/11 0652) Temp Source: Oral (10/11 0652) BP: 209/96 (10/11 1130) Pulse Rate: 91 (10/11 1130)  Labs: Recent Labs    05/20/21 0700 05/20/21 1048  HGB 9.9*  --   HCT 33.1*  --   PLT 385  --   CREATININE 8.85*  --   TROPONINIHS 650* 849*    Estimated Creatinine Clearance: 7.3 mL/min (A) (by C-G formula based on SCr of 8.85 mg/dL (H)).   Medical History: Past Medical History:  Diagnosis Date   Anal abscess    chronic   Anxiety    Depression 06/28/2006   Qualifier: Diagnosis of  By: Riccardo Dubin MD, [place]     Diabetes mellitus type 2 in obese ([place]) 06/28/1994   Dyspnea    uses oxygeb 2 liters per minute at dialysis   End stage renal disease on dialysis ([facility])    Erosive esophagitis    Esophageal reflux    Eye redness    Gastroparesis    ? diabetic   GERD (gastroesophageal reflux disease)    History of blood transfusion 2019   Human immunodeficiency virus (HIV) disease ([place]) 04/23/2016   Hypertension    Metabolic bone disease [id number]   Moderate nonproliferative diabetic retinopathy of both eyes ([place]) 11/21/2014   11/14/14: Noted on retinal imaging; needs follow-up imaging in 6 months  05/22/16: Noted on retinal imaging again; needs follow-up imaging in 6 months   Type 2 diabetes mellitus with diabetic peripheral angiopathy without gangrene ([place]) 05/01/2019   Wears dentures    lower   Wound infection s/p L transmetatarsal amputation     Medications: see MAR  Assessment: 45 yo F with troponin elevation 610 > 849, no significant EKG changes. Cardiology consulted. Heparin consult for anticoagulation. No AC PTA. CBC baseline wnl - Hgb 9.9 PLTc 385  Goal of Therapy:   Heparin level 0.3-0.7 units/ml Monitor platelets by anticoagulation protocol: Yes   Plan: Give 4000 units bolus x 1 Start heparin infusion at 800 units/hr Check anti-Xa level in 8 hours and daily while on heparin Continue to monitor H&H and platelets  Joetta Manners, PharmD, [facility] Emergency Medicine Clinical Pharmacist ED RPh Phone: [place]: [phone]

## 2021-05-20 NOTE — H&P (Signed)
NAME:  Kathy Frank, MRN:  [id number], DOB:  June 25, 1976, LOS: 0 ADMISSION DATE:  05/20/2021, Primary: Kathy Beard, MD  CHIEF COMPLAINT:  chest pain, shortness of breath, headache   Medical Service: Internal Medicine Teaching Service         Attending Physician: Dr. Jimmye Norman, Elaina Pattee, MD    First Contact: Dr. Humphrey Rolls Pager: [phone]  Second Contact: Dr. Charleen Kirks Pager: [phone]       After Hours (After 5p/  First Contact Pager: [phone]  weekends / holidays): Second Contact Pager: [phone]    History of present illness   Kathy Frank is a 45 year old female with pertinent history of hypertension, diabetes, prior CVA, HIV, and ESRD (THS) who presented to [facility] ED on 05/20/21 for acute onset headache, chest pain and shortness of breath that awoke her out of sleep this morning.   Chest pain is substernal, constant, and associated with shortness of breath. She says it feels like a squeezing pressure, not sharp or pleuritic. She reports one incident of similar chest pain following her HD session last Tuesday when it radiated to her left arm, but otherwise has not experienced pain. Duration is unclear. She is wheelchair bound and denies experiencing chest pain with the exertion associated with wheeling herself around.  She denies recent respiratory illness, fever, chills, or cough. Denies orthopnea. She reports compliance with her hypertensive agents including amlodipine and hydralazine and denies any missed doses.   Headache is predominantly on the right. Reports mild neck discomfort. Light makes the pain worse. Denies vision changes. Denies acute weakness or paraesthesias.   Past Medical History  She,  has a past medical history of Anal abscess, Anxiety, Depression (06/28/2006), Diabetes mellitus type 2 in obese ([place]) (06/28/1994), Dyspnea, End stage renal disease on dialysis [name]), Erosive esophagitis, Esophageal reflux, Eye redness, Gastroparesis, GERD (gastroesophageal reflux  disease), History of blood transfusion (2019), Human immunodeficiency virus (HIV) disease ([place]) (04/23/2016), Hypertension, Metabolic bone disease ([id number]), Moderate nonproliferative diabetic retinopathy of both eyes ([place]) (11/21/2014), Type 2 diabetes mellitus with diabetic peripheral angiopathy without gangrene ([place]) (05/01/2019), Wears dentures, and Wound infection s/p L transmetatarsal amputation.   Home Medications     Prior to Admission medications   Medication Sig Start Date End Date Taking? Authorizing Provider  Accu-Chek Softclix Lancets lancets Use as instructed 05/02/21   Atway, Rayann N, DO  albuterol (VENTOLIN HFA) 108 (90 Base) MCG/ACT inhaler Inhale 2 puffs into the lungs every 4 (four) hours as needed for wheezing or shortness of breath. 02/14/21   Angiulli, Lavon Paganini, PA-C  amLODipine (NORVASC) 10 MG tablet Take 1 tablet (10 mg total) by mouth daily. 05/15/21   Kathy Beard, MD  aspirin EC 81 MG tablet Take 1 tablet (81 mg total) by mouth daily. Swallow whole. 12/17/20   Angelia Mould, MD  atorvastatin (LIPITOR) 40 MG tablet Take 1 tablet (40 mg total) by mouth daily. 02/14/21   Angiulli, Lavon Paganini, PA-C  bictegravir-emtricitabine-tenofovir AF (BIKTARVY) 50-200-25 MG TABS tablet TAKE 1 TABLET BY MOUTH DAILY. 04/28/21 05/29/21  Michel Bickers, MD  calcitRIOL (ROCALTROL) 0.25 MCG capsule Take 1 capsule (0.25 mcg total) by mouth Every Tuesday,Thursday,and Saturday with dialysis. 04/01/21   Ulyses Amor, PA-C  calcium acetate (PHOSLO) 667 MG capsule Take 3 capsules (2,001 mg total) by mouth 3 (three) times daily with meals. 02/14/21   Angiulli, Lavon Paganini, PA-C  clopidogrel (PLAVIX) 75 MG tablet Take 1 tablet (75 mg total) by mouth daily. 02/14/21  Angiulli, Lavon Paganini, PA-C  gabapentin (NEURONTIN) 100 MG capsule Take 1-3 capsules (100-300 mg total) by mouth See admin instructions. Take 100 mg on Monday, Wednesday, Friday and Sunday 05/02/21 07/31/21  Atway, Rayann N, DO  glucose blood  test strip Use as instructed 05/02/21   Atway, Rayann N, DO  hydrALAZINE (APRESOLINE) 50 MG tablet Take 1 tablet (50 mg total) by mouth 3 (three) times daily. Take 1 tablet ('50mg'$ ) 3 times daily on NON DIALYSIS DAYS (MWFSun) 05/02/21 06/01/21  Atway, Rayann N, DO  insulin glargine (LANTUS SOLOSTAR) 100 UNIT/ML Solostar Pen Inject 8 Units into the skin at bedtime. Patient taking differently: Inject 10 Units into the skin at bedtime. 02/14/21   Angiulli, Lavon Paganini, PA-C  Insulin Pen Needle (PENTIPS) 32G X 4 MM MISC Use as directed 02/14/21   Meredith Staggers, MD  liraglutide (VICTOZA) 18 MG/3ML SOPN Inject 0.6 mg into the skin daily. 05/02/21 06/01/21  Atway, Jeananne Rama, DO  methocarbamol (ROBAXIN) 500 MG tablet Take 1 tablet (500 mg total) by mouth 4 (four) times daily. 05/05/21   Ulyses Amor, PA-C  multivitamin (RENA-VIT) TABS tablet Take 1 tablet by mouth daily. 02/14/21   Angiulli, Lavon Paganini, PA-C  pantoprazole (PROTONIX) 40 MG tablet Take 1 tablet (40 mg total) by mouth daily. 02/14/21   Angiulli, Lavon Paganini, PA-C  polyethylene glycol (MIRALAX / GLYCOLAX) 17 g packet Take 17 g by mouth 2 (two) times daily. Patient taking differently: Take 17 g by mouth daily as needed for mild constipation or moderate constipation. 02/14/21   Angiulli, Lavon Paganini, PA-C  sertraline (ZOLOFT) 50 MG tablet Take 1 tablet (50 mg total) by mouth at bedtime. 02/14/21   Angiulli, Lavon Paganini, PA-C    Allergies    Allergies as of 05/20/2021   (No Known Allergies)    Social History   reports that she quit smoking about 6 years ago. Her smoking use included cigarettes. She started smoking about 7 years ago. She has a 1.00 pack-year smoking history. She has never used smokeless tobacco. She reports that she does not currently use alcohol. She reports that she does not currently use drugs after having used the following drugs: Marijuana.   Family History   Her family history includes Diabetes in her brother, daughter, daughter, maternal  grandmother, and mother; Mental retardation in her brother.   ROS  Negative unless stated otherwise in the HPI.  Objective   Blood pressure (!) 209/96, pulse 91, temperature 98.4 F (36.9 C), temperature source Oral, resp. rate (!) 22, SpO2 100 %.    Examination: GENERAL: acutely ill appearing femaile in in no acute distress HENT: head atraumatic. MMM EYES: no conjunctival injection or scleral icterus.  CARDIAC: heart RRR. JVD present. Palpable thrill overlying left upper arm fistula PULMONARY: breathing comfortably on room air. Lungs are clear throughout.  ABDOMEN: soft. Nontender to palpation.  Nondistended.  SKIN: wound vac placed to the left lower extremity. No rash on limited exam MSK: left AKA, right BKA NEURO:  Mental Status: a/o x4.  Speech: normal speech pattern and articulation. No dysarthria Cranial nerves: PERRL. EOM intact. Face symmetric. Hearing intact to voice. Tongue and uvula midline.  Motor: 5/5 strength in the bilateral upper and lower extremities. No tremor.  Sensory: equal and intact in bilateral upper and lower extremities.  Gait: n/a  Significant Diagnostic Tests:  EKG: normal rate, sinus rhythm. No ischemic changes. Qtc 521.  CXR: no acute cardiopulmonary abnormalities  CT head w/o: prior infarct in  the centrum semiovale present on prior exams. No hemorrhage or acute infarct.   Summary  45 year old female with hypertension, diabetes, prior CVA, HIV, and ESRD who was admitted to the teaching service on 05/20/21 for hypertensive urgency.  Assessment & Plan:  Active Problems:   Hypertensive urgency  Hypertensive urgency with associated headache and shortness of breath.  Blood pressure on arrival 207/98.  Reports compliance with home hydralazine and amlodipine. Baseline blood pressure are largely labile however seem to run in the 170s-200s fairly consistently.  Head CT negative for acute bleed so I suspect her headache is attributable to secondary to  hypertension. EDP initially noted right facial droop however I do not appreciate this on my exam.  Breathing comfortably on room air and no pulmonary edema on CXR NSTEMI Supported by elevated troponins to 610>849, clinical symptoms. No significant EKG ischemic changes.  QT prolongation Plan Heparin gtt Trend troponins Cardiology consulted Nephrology is requesting we hold antihypertensive agents in anticipation of HD. After HD, may resume the following Continue home amlodipine and hydralazine.  Prn labetalol IV. 24 hour MAP goal 90-169mHg. Systolic blood pressure goal should be around 155. Will need a slow blood pressure taper given her long standing uncontrolled hypertension.  Tele monitoring Avoid QT prolonging agents Stat head CT for acute neurologic changes Prn tylenol for headaches. 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  Follows with Dr. BTrula Sladein vascular surgery  Continue wound vac  Wound care nurse consulted  Continue plavix and statin   Type 2 DM--controlled Continue home lantus 8U QHS Hold victoza SSI  Depression Continue zoloft  GERD Continue protonix  HIV Continue biktarvy  Anemia of ESRD. Chronic and stable.   Best practice:  CODE STATUS: Full Diet: renal Pain management: prn tylenol DVT for prophylaxis: heparin gtt Social considerations/Family communication: per pt Dispo: Admit patient to Observation with expected length of stay less than 2 midnights.   RMitzi Hansen MD Internal Medicine Resident PGY-3 [facility] Pager: #[phone]/06/2021 12:11 PM

## 2021-05-20 NOTE — Consult Note (Signed)
WOC consulted for amputation site with NPWT dressing from home. Orders updated to have nursing staff attach current dressing to hospital device and WOC will change on M/W/F schedule beginning tomorrow 05/21/21 Have patient take home unit home to avoid loss while inpatient.  [place], [place], [place]

## 2021-05-20 NOTE — ED Notes (Signed)
Ordered tray for PT

## 2021-05-21 ENCOUNTER — Encounter (HOSPITAL_COMMUNITY): Payer: Self-pay | Admitting: Internal Medicine

## 2021-05-21 ENCOUNTER — Other Ambulatory Visit: Payer: Self-pay

## 2021-05-21 ENCOUNTER — Observation Stay (HOSPITAL_COMMUNITY): Payer: Medicare Other

## 2021-05-21 ENCOUNTER — Inpatient Hospital Stay (HOSPITAL_COMMUNITY)
Admission: EM | Disposition: A | Discharge status: Home Health | Payer: Self-pay | Source: Home / Self Care | Attending: Internal Medicine

## 2021-05-21 DIAGNOSIS — R079 Chest pain, unspecified: Secondary | ICD-10-CM

## 2021-05-21 DIAGNOSIS — I700 Atherosclerosis of aorta: Secondary | ICD-10-CM | POA: Diagnosis present

## 2021-05-21 DIAGNOSIS — B20 Human immunodeficiency virus [HIV] disease: Secondary | ICD-10-CM | POA: Diagnosis present

## 2021-05-21 DIAGNOSIS — R0602 Shortness of breath: Secondary | ICD-10-CM | POA: Diagnosis not present

## 2021-05-21 DIAGNOSIS — N2581 Secondary hyperparathyroidism of renal origin: Secondary | ICD-10-CM | POA: Diagnosis present

## 2021-05-21 DIAGNOSIS — Z20822 Contact with and (suspected) exposure to covid-19: Secondary | ICD-10-CM | POA: Diagnosis not present

## 2021-05-21 DIAGNOSIS — I25110 Atherosclerotic heart disease of native coronary artery with unstable angina pectoris: Secondary | ICD-10-CM | POA: Diagnosis not present

## 2021-05-21 DIAGNOSIS — E1151 Type 2 diabetes mellitus with diabetic peripheral angiopathy without gangrene: Secondary | ICD-10-CM | POA: Diagnosis present

## 2021-05-21 DIAGNOSIS — E669 Obesity, unspecified: Secondary | ICD-10-CM | POA: Diagnosis present

## 2021-05-21 DIAGNOSIS — I160 Hypertensive urgency: Secondary | ICD-10-CM | POA: Diagnosis not present

## 2021-05-21 DIAGNOSIS — E1143 Type 2 diabetes mellitus with diabetic autonomic (poly)neuropathy: Secondary | ICD-10-CM | POA: Diagnosis present

## 2021-05-21 DIAGNOSIS — I214 Non-ST elevation (NSTEMI) myocardial infarction: Secondary | ICD-10-CM | POA: Diagnosis not present

## 2021-05-21 DIAGNOSIS — E871 Hypo-osmolality and hyponatremia: Secondary | ICD-10-CM | POA: Diagnosis not present

## 2021-05-21 DIAGNOSIS — F32A Depression, unspecified: Secondary | ICD-10-CM | POA: Diagnosis present

## 2021-05-21 DIAGNOSIS — I161 Hypertensive emergency: Secondary | ICD-10-CM | POA: Diagnosis not present

## 2021-05-21 DIAGNOSIS — E875 Hyperkalemia: Secondary | ICD-10-CM | POA: Diagnosis present

## 2021-05-21 DIAGNOSIS — E1122 Type 2 diabetes mellitus with diabetic chronic kidney disease: Secondary | ICD-10-CM | POA: Diagnosis not present

## 2021-05-21 DIAGNOSIS — K219 Gastro-esophageal reflux disease without esophagitis: Secondary | ICD-10-CM | POA: Diagnosis present

## 2021-05-21 DIAGNOSIS — I120 Hypertensive chronic kidney disease with stage 5 chronic kidney disease or end stage renal disease: Secondary | ICD-10-CM | POA: Diagnosis not present

## 2021-05-21 DIAGNOSIS — D631 Anemia in chronic kidney disease: Secondary | ICD-10-CM | POA: Diagnosis not present

## 2021-05-21 DIAGNOSIS — T8189XA Other complications of procedures, not elsewhere classified, initial encounter: Secondary | ICD-10-CM | POA: Diagnosis not present

## 2021-05-21 DIAGNOSIS — F419 Anxiety disorder, unspecified: Secondary | ICD-10-CM | POA: Diagnosis present

## 2021-05-21 DIAGNOSIS — Z955 Presence of coronary angioplasty implant and graft: Secondary | ICD-10-CM | POA: Diagnosis not present

## 2021-05-21 DIAGNOSIS — E113393 Type 2 diabetes mellitus with moderate nonproliferative diabetic retinopathy without macular edema, bilateral: Secondary | ICD-10-CM | POA: Diagnosis not present

## 2021-05-21 DIAGNOSIS — E1165 Type 2 diabetes mellitus with hyperglycemia: Secondary | ICD-10-CM | POA: Diagnosis present

## 2021-05-21 DIAGNOSIS — N186 End stage renal disease: Secondary | ICD-10-CM | POA: Diagnosis not present

## 2021-05-21 DIAGNOSIS — E1129 Type 2 diabetes mellitus with other diabetic kidney complication: Secondary | ICD-10-CM | POA: Diagnosis not present

## 2021-05-21 DIAGNOSIS — N250 Renal osteodystrophy: Secondary | ICD-10-CM | POA: Diagnosis not present

## 2021-05-21 DIAGNOSIS — G43909 Migraine, unspecified, not intractable, without status migrainosus: Secondary | ICD-10-CM | POA: Diagnosis present

## 2021-05-21 DIAGNOSIS — Z992 Dependence on renal dialysis: Secondary | ICD-10-CM | POA: Diagnosis not present

## 2021-05-21 DIAGNOSIS — E872 Acidosis, unspecified: Secondary | ICD-10-CM | POA: Diagnosis not present

## 2021-05-21 HISTORY — PX: LEFT HEART CATH AND CORONARY ANGIOGRAPHY: CATH118249

## 2021-05-21 HISTORY — PX: CORONARY STENT INTERVENTION: CATH118234

## 2021-05-21 LAB — BASIC METABOLIC PANEL
Anion gap: 15 (ref 5–15)
BUN: 32 mg/dL — ABNORMAL HIGH (ref 6–20)
CO2: 24 mmol/L (ref 22–32)
Calcium: 8.7 mg/dL — ABNORMAL LOW (ref 8.9–10.3)
Chloride: 94 mmol/L — ABNORMAL LOW (ref 98–111)
Creatinine, Ser: 5.55 mg/dL — ABNORMAL HIGH (ref 0.44–1.00)
GFR, Estimated: 9 mL/min — ABNORMAL LOW (ref >60)
Glucose, Bld: 113 mg/dL — ABNORMAL HIGH (ref 70–99)
Potassium: 4.7 mmol/L (ref 3.5–5.1)
Sodium: 133 mmol/L — ABNORMAL LOW (ref 135–145)

## 2021-05-21 LAB — ECHOCARDIOGRAM COMPLETE
Area-P 1/2: 3.53 cm2
Height: 65 in
S' Lateral: 3.2 cm
Weight: 2444.46 oz

## 2021-05-21 LAB — POCT ACTIVATED CLOTTING TIME
Activated Clotting Time: 266 seconds
Activated Clotting Time: 289 seconds
Activated Clotting Time: 190 seconds

## 2021-05-21 LAB — GLUCOSE, CAPILLARY
Glucose-Capillary: 115 mg/dL — ABNORMAL HIGH (ref 70–99)
Glucose-Capillary: 102 mg/dL — ABNORMAL HIGH (ref 70–99)
Glucose-Capillary: 98 mg/dL (ref 70–99)
Glucose-Capillary: 152 mg/dL — ABNORMAL HIGH (ref 70–99)

## 2021-05-21 LAB — SURGICAL PCR SCREEN
MRSA, PCR: NEGATIVE
Staphylococcus aureus: POSITIVE — AB

## 2021-05-21 LAB — HEPATITIS B SURFACE ANTIBODY, QUANTITATIVE: Hep B S AB Quant (Post): 103.2 m[IU]/mL (ref >9.9)

## 2021-05-21 SURGERY — LEFT HEART CATH AND CORONARY ANGIOGRAPHY
Anesthesia: LOCAL

## 2021-05-21 MED ORDER — PENTAFLUOROPROP-TETRAFLUOROETH EX AERO: 1.0000 "application " | INHALATION_SPRAY | CUTANEOUS | Status: DC | PRN

## 2021-05-21 MED ORDER — ATORVASTATIN CALCIUM 80 MG PO TABS
80.0000 mg | ORAL_TABLET | Freq: Every day | ORAL | Status: DC
  Administered 2021-05-22 – 2021-05-23 (×2): 80 mg via ORAL
  Filled 2021-05-21 (×2): qty 1

## 2021-05-21 MED ORDER — NITROGLYCERIN 1 MG/10 ML FOR IR/CATH LAB
INTRA_ARTERIAL | Status: AC
  Filled 2021-05-21: qty 10

## 2021-05-21 MED ORDER — SODIUM CHLORIDE 0.9 % IV SOLN: 250.0000 mL | INTRAVENOUS | Status: DC | PRN

## 2021-05-21 MED ORDER — HEPARIN SODIUM (PORCINE) 1000 UNIT/ML IJ SOLN
INTRAMUSCULAR | Status: AC
  Filled 2021-05-21: qty 1

## 2021-05-21 MED ORDER — HEPARIN SODIUM (PORCINE) 5000 UNIT/ML IJ SOLN
5000.0000 [IU] | Freq: Three times a day (TID) | INTRAMUSCULAR | Status: DC
  Administered 2021-05-21 – 2021-05-23 (×5): 5000 [IU] via SUBCUTANEOUS
  Filled 2021-05-21 (×6): qty 1

## 2021-05-21 MED ORDER — HEPARIN SODIUM (PORCINE) 1000 UNIT/ML DIALYSIS
1000.0000 [IU] | INTRAMUSCULAR | Status: DC | PRN
  Filled 2021-05-21: qty 1

## 2021-05-21 MED ORDER — MORPHINE SULFATE (PF) 2 MG/ML IV SOLN
INTRAVENOUS | Status: AC
  Filled 2021-05-21: qty 1

## 2021-05-21 MED ORDER — LIDOCAINE HCL (PF) 1 % IJ SOLN: 5.0000 mL | INTRAMUSCULAR | Status: DC | PRN

## 2021-05-21 MED ORDER — HEPARIN (PORCINE) IN NACL 1000-0.9 UT/500ML-% IV SOLN
INTRAVENOUS | Status: AC
  Filled 2021-05-21: qty 1000

## 2021-05-21 MED ORDER — SODIUM CHLORIDE 0.9 % IV SOLN: 100.0000 mL | INTRAVENOUS | Status: DC | PRN

## 2021-05-21 MED ORDER — HEPARIN SODIUM (PORCINE) 1000 UNIT/ML DIALYSIS
3000.0000 [IU] | INTRAMUSCULAR | Status: DC | PRN
Start: 2021-05-22 — End: 2021-05-22
  Filled 2021-05-21: qty 3

## 2021-05-21 MED ORDER — IOHEXOL 350 MG/ML SOLN
INTRAVENOUS | Status: DC | PRN
  Administered 2021-05-21: 80 mL

## 2021-05-21 MED ORDER — CLOPIDOGREL BISULFATE 300 MG PO TABS
ORAL_TABLET | ORAL | Status: AC
  Filled 2021-05-21: qty 1

## 2021-05-21 MED ORDER — MIDAZOLAM HCL 2 MG/2ML IJ SOLN
INTRAMUSCULAR | Status: AC
  Filled 2021-05-21: qty 2

## 2021-05-21 MED ORDER — ONDANSETRON HCL 4 MG/2ML IJ SOLN: 4.0000 mg | Freq: Four times a day (QID) | INTRAMUSCULAR | Status: DC | PRN

## 2021-05-21 MED ORDER — ALTEPLASE 2 MG IJ SOLR
2.0000 mg | Freq: Once | INTRAMUSCULAR | Status: DC | PRN
  Filled 2021-05-21: qty 2

## 2021-05-21 MED ORDER — CLOPIDOGREL BISULFATE 300 MG PO TABS
ORAL_TABLET | ORAL | Status: DC | PRN
  Administered 2021-05-21: 300 mg via ORAL

## 2021-05-21 MED ORDER — LIDOCAINE HCL (PF) 1 % IJ SOLN
INTRAMUSCULAR | Status: AC
  Filled 2021-05-21: qty 30

## 2021-05-21 MED ORDER — FENTANYL CITRATE (PF) 100 MCG/2ML IJ SOLN
INTRAMUSCULAR | Status: AC
  Filled 2021-05-21: qty 2

## 2021-05-21 MED ORDER — SODIUM CHLORIDE 0.9% FLUSH
3.0000 mL | Freq: Two times a day (BID) | INTRAVENOUS | Status: DC
  Administered 2021-05-21 – 2021-05-23 (×3): 3 mL via INTRAVENOUS

## 2021-05-21 MED ORDER — SODIUM CHLORIDE 0.9% FLUSH: 3.0000 mL | INTRAVENOUS | Status: DC | PRN

## 2021-05-21 MED ORDER — MIDAZOLAM HCL 2 MG/2ML IJ SOLN
INTRAMUSCULAR | Status: DC | PRN
  Administered 2021-05-21: 1 mg via INTRAVENOUS

## 2021-05-21 MED ORDER — LIDOCAINE-PRILOCAINE 2.5-2.5 % EX CREA
1.0000 "application " | TOPICAL_CREAM | CUTANEOUS | Status: DC | PRN
  Filled 2021-05-21: qty 5

## 2021-05-21 MED ORDER — HYDRALAZINE HCL 50 MG PO TABS
75.0000 mg | ORAL_TABLET | Freq: Three times a day (TID) | ORAL | Status: DC
  Administered 2021-05-21 – 2021-05-23 (×5): 75 mg via ORAL
  Filled 2021-05-21 (×5): qty 1

## 2021-05-21 MED ORDER — FENTANYL CITRATE (PF) 100 MCG/2ML IJ SOLN
INTRAMUSCULAR | Status: DC | PRN
  Administered 2021-05-21: 25 ug via INTRAVENOUS

## 2021-05-21 MED ORDER — HYDRALAZINE HCL 20 MG/ML IJ SOLN: 10.0000 mg | INTRAMUSCULAR | Status: AC | PRN

## 2021-05-21 MED ORDER — MUPIROCIN 2 % EX OINT
1.0000 "application " | TOPICAL_OINTMENT | Freq: Two times a day (BID) | CUTANEOUS | Status: DC
  Administered 2021-05-21 – 2021-05-23 (×5): 1 via NASAL
  Filled 2021-05-21 (×4): qty 22

## 2021-05-21 MED ORDER — HEPARIN SODIUM (PORCINE) 1000 UNIT/ML IJ SOLN
INTRAMUSCULAR | Status: DC | PRN
  Administered 2021-05-21: 2000 [IU] via INTRAVENOUS
  Administered 2021-05-21: 6500 [IU] via INTRAVENOUS

## 2021-05-21 MED ORDER — MORPHINE SULFATE (PF) 2 MG/ML IV SOLN
2.0000 mg | INTRAVENOUS | Status: DC | PRN
  Administered 2021-05-21: 2 mg via INTRAVENOUS

## 2021-05-21 MED ORDER — LIDOCAINE HCL (PF) 1 % IJ SOLN
INTRAMUSCULAR | Status: DC | PRN
  Administered 2021-05-21: 10 mL

## 2021-05-21 MED ORDER — FENTANYL CITRATE PF 50 MCG/ML IJ SOSY
12.5000 ug | PREFILLED_SYRINGE | INTRAMUSCULAR | Status: DC | PRN
Start: 2021-05-21 — End: 2021-05-24
  Administered 2021-05-21 – 2021-05-23 (×4): 12.5 ug via INTRAVENOUS
  Filled 2021-05-21 (×4): qty 1

## 2021-05-21 MED ORDER — HEPARIN (PORCINE) IN NACL 1000-0.9 UT/500ML-% IV SOLN
INTRAVENOUS | Status: DC | PRN
  Administered 2021-05-21 (×2): 500 mL

## 2021-05-21 SURGICAL SUPPLY — 14 items
BALLN SAPPHIRE 2.5X12 (BALLOONS) ×2
BALLOON SAPPHIRE 2.5X12 (BALLOONS) IMPLANT
CATH INFINITI 5FR MULTPACK ANG (CATHETERS) ×1 IMPLANT
CATH VISTA GUIDE 6FR XBLAD3.5 (CATHETERS) ×1 IMPLANT
KIT ENCORE 26 ADVANTAGE (KITS) ×1 IMPLANT
KIT HEART LEFT (KITS) ×2 IMPLANT
PACK CARDIAC CATHETERIZATION (CUSTOM PROCEDURE TRAY) ×2 IMPLANT
SHEATH PINNACLE 5F 10CM (SHEATH) ×1 IMPLANT
SHEATH PINNACLE 6F 10CM (SHEATH) ×1 IMPLANT
STENT ONYX FRONTIER 2.75X15 (Permanent Stent) ×1 IMPLANT
TRANSDUCER W/STOPCOCK (MISCELLANEOUS) ×2 IMPLANT
TUBING CIL FLEX 10 FLL-RA (TUBING) ×2 IMPLANT
WIRE ASAHI PROWATER 180CM (WIRE) ×1 IMPLANT
WIRE EMERALD 3MM-J .035X150CM (WIRE) ×1 IMPLANT

## 2021-05-21 NOTE — Progress Notes (Signed)
Site area: Right groin a 6 french arterial sheath was removed  Site Prior to Removal:  Level 0  Pressure Applied For 20 MINUTES    Bedrest  Beginning at 1600p X 4 hours  Manual:   Yes.    Patient Status During Pull:  stable  Post Pull Groin Site:  Level 0  Post Pull Instructions Given:  Yes.    Post Pull Pulses Present:  Yes.    Dressing Applied:  Yes.    Comments:

## 2021-05-21 NOTE — Consult Note (Signed)
[facility] Nurse Consult Note: Reason for Consult:patient admitted with home VAC therapy to the right stump wound. She was last seen in [facility] office 05/05/21. Reports she missed her appointment last week. Reviewed images; discussed with [facility] PA Wound type: surgical  Pressure Injury POA: NA Measurement: 3 small openings 0.2cm x 0.2cm x 0.1cm/ 0.4cm x 2.5cm x 0.1cm/ 0.1cm x 0.1cm x 0.1cm  Wound [id number]; small amounts of loose slough less than 10%; trimmed away as much as possible  Drainage (amount, consistency, odor) minimal in VAC canister Periwound: intact  Dressing procedure/placement/frequency: Removed NPWT dressing, wound is almost closed, with no depth. Verified with [facility] ok to discontinue based on my assessment today.  Cleansed wound with saline, apply hydrogel to the open wounds, topped with dry dressings, secured with medifix tape. Change daily.  Apply silicone foam to an area that is blistered from the [facility] drape medial knee area. Change every 3 days.  Discussed POC with patient and bedside nurse.  Re consult if needed, will not follow at this time. Thanks  Joseeduardo Brix [employer], RN,CWOCN, CNS, [place] [phone])

## 2021-05-21 NOTE — Progress Notes (Signed)
Patient is in Cath lab

## 2021-05-21 NOTE — Progress Notes (Signed)
[facility] Progress Note   Subjective:   Completed dialysis yesterday net UF 2.1L  Ongoing headache. No cp, sob.  Per cards for LHC today   Objective Vitals:   05/20/21 2352 05/21/21 0422 05/21/21 0500 05/21/21 0817  BP: (!) 149/78 (!) 159/89  139/68  Pulse: 76 76  81  Resp: '15 15  18  '$ Temp: 98.9 F (37.2 C) 98 F (36.7 C)  98.3 F (36.8 C)  TempSrc: Oral Oral  Oral  SpO2: 99% 99%  100%  Weight:   69.3 kg   Height:         Additional Objective Labs: Basic Metabolic Panel: Recent Labs  Lab 05/20/21 0700 05/20/21 1214 05/20/21 1243  NA 140  --  137  K 5.8*  --  5.2*  CL 96*  --   --   CO2 19*  --   --   GLUCOSE 113*  --   --   BUN 68*  --   --   CREATININE 8.85* 9.25*  --   CALCIUM 8.2*  --   --    CBC: Recent Labs  Lab 05/20/21 0700 05/20/21 1243  WBC 7.7  --   NEUTROABS 4.6  --   HGB 9.9* 11.6*  HCT 33.1* 34.0*  MCV 98.5  --   PLT 385  --    Blood Culture    Component Value Date/Time   SDES BLOOD RIGHT ANTECUBITAL 01/28/2021 1812   SPECREQUEST  01/28/2021 1812    BOTTLES DRAWN AEROBIC AND ANAEROBIC Blood Culture adequate volume   CULT  01/28/2021 1812    NO GROWTH 5 DAYS Performed at [facility], [place] [address], [place],  [address]    REPTSTATUS 02/02/2021 FINAL 01/28/2021 1812     Physical Exam General: chronically ill appearing, nad  Heart: RRR No rub  Lungs: Clear bilaterally  Abdomen: soft non-tender  Extremities: bilateral BKAs No edema  Dialysis Access: LUE AVF +bruit   Medications:  sodium chloride     sodium chloride 10 mL/hr at 05/21/21 0505   heparin 800 Units/hr (05/21/21 0509)    amLODipine  10 mg Oral Daily   aspirin EC  81 mg Oral Daily   atorvastatin  40 mg Oral Daily   bictegravir-emtricitabine-tenofovir AF  1 tablet Oral Daily   calcitRIOL  0.25 mcg Oral Q T,Th,Sa-HD   calcium acetate  2,001 mg Oral TID WC   Chlorhexidine Gluconate Cloth  6 each Topical Q0600   clopidogrel  75 mg  Oral Daily   hydrALAZINE  50 mg Oral TID   insulin aspart  0-6 Units Subcutaneous TID WC   insulin glargine-yfgn  8 Units Subcutaneous QHS   metoprolol tartrate  25 mg Oral BID   mupirocin ointment  1 application Nasal BID   pantoprazole  40 mg Oral Daily   sertraline  50 mg Oral QHS   sodium chloride flush  3 mL Intravenous Q12H   sodium zirconium cyclosilicate  10 g Oral Daily    Dialysis Orders:  [facility] TTS 4h  400/A1.5x EDW  67.5kg 2K/2Ca  AVF Heparin 3000 U  -Venofer 100 mg  x 10 (until 10/15)  -Mircera 150 q 2 weeks  (last 10/6)  -Calcitriol 0.25 TIW    Assessment/Plan: ESRD -  HD TTS. Continue on schedule. Next HD 10/13.  Elevated troponin -  per cardiology. For LHC today  Headache - Per primary. No acute findings on head CT.  Hypertension/volume  - BP improved. Continue  home HTN meds. Post HD wt 69.3kg. Continue UF to dry weight.  Anemia  - Hgb 9.9 . On ESA/Fe load as outpatient.  Metabolic bone disease -  Continue home binders.  Nutrition - Renal diet with fluid restriction.   Lynnda Child PA-C [facility] 05/21/2021,9:41 AM

## 2021-05-21 NOTE — Progress Notes (Signed)
Progress Note  Patient Name: Kathy Frank Date of Encounter: 05/21/2021  Primary Cardiologist: None   Subjective   Patient seen and examined at her bedside. She was awake in bed when I arrived. She denies and chest pain. She is aware of her LHC today. No other questions at this time.  Inpatient Medications    Scheduled Meds:  amLODipine  10 mg Oral Daily   aspirin EC  81 mg Oral Daily   atorvastatin  40 mg Oral Daily   bictegravir-emtricitabine-tenofovir AF  1 tablet Oral Daily   calcitRIOL  0.25 mcg Oral Q T,Th,Sa-HD   calcium acetate  2,001 mg Oral TID WC   Chlorhexidine Gluconate Cloth  6 each Topical Q0600   clopidogrel  75 mg Oral Daily   hydrALAZINE  50 mg Oral TID   insulin aspart  0-6 Units Subcutaneous TID WC   insulin glargine-yfgn  8 Units Subcutaneous QHS   metoprolol tartrate  25 mg Oral BID   mupirocin ointment  1 application Nasal BID   pantoprazole  40 mg Oral Daily   sertraline  50 mg Oral QHS   sodium chloride flush  3 mL Intravenous Q12H   sodium zirconium cyclosilicate  10 g Oral Daily   Continuous Infusions:  sodium chloride     sodium chloride 10 mL/hr at 05/21/21 0505   heparin 800 Units/hr (05/21/21 0509)   PRN Meds: sodium chloride, acetaminophen **OR** acetaminophen, labetalol, polyethylene glycol, sodium chloride flush   Vital Signs    Vitals:   05/20/21 2352 05/21/21 0422 05/21/21 0500 05/21/21 0817  BP: (!) 149/78 (!) 159/89  139/68  Pulse: 76 76  81  Resp: '15 15  18  '$ Temp: 98.9 F (37.2 C) 98 F (36.7 C)  98.3 F (36.8 C)  TempSrc: Oral Oral  Oral  SpO2: 99% 99%  100%  Weight:   69.3 kg   Height:        Intake/Output Summary (Last 24 hours) at 05/21/2021 0848 Last data filed at 05/20/2021 2130 Gross per 24 hour  Intake 0 ml  Output 2136 ml  Net -2136 ml   Filed Weights   05/20/21 1200 05/21/21 0500  Weight: 68 kg 69.3 kg    Telemetry    Sinus rhythm - Personally Reviewed  ECG    Ecg showed sinus rhythm  with HR 90 bpm with evidence of old anterior wall  - Personally Reviewed  Physical Exam   GEN: No acute distress.   Neck: No JVD Cardiac: RRR, no murmurs, rubs, or gallops.  Respiratory: Clear to auscultation bilaterally. GI: Soft, nontender, non-distended  MS: Left AKA, Right BKA with wound vac Neuro:  Nonfocal  Psych: Normal affect  Skin with tattoos,  Labs    Chemistry Recent Labs  Lab 05/20/21 0700 05/20/21 1214 05/20/21 1243  NA 140  --  137  K 5.8*  --  5.2*  CL 96*  --   --   CO2 19*  --   --   GLUCOSE 113*  --   --   BUN 68*  --   --   CREATININE 8.85* 9.25*  --   CALCIUM 8.2*  --   --   PROT 9.2*  --   --   ALBUMIN 3.3*  --   --   AST 15  --   --   ALT 9  --   --   ALKPHOS 76  --   --   BILITOT 1.0  --   --  GFRNONAA 5* 5*  --   ANIONGAP 25*  --   --      Hematology Recent Labs  Lab 05/20/21 0700 05/20/21 1243  WBC 7.7  --   RBC 3.36*  --   HGB 9.9* 11.6*  HCT 33.1* 34.0*  MCV 98.5  --   MCH 29.5  --   MCHC 29.9*  --   RDW 19.8*  --   PLT 385  --     Cardiac EnzymesNo results for input(s): TROPONINI in the last 168 hours. No results for input(s): TROPIPOC in the last 168 hours.   BNPNo results for input(s): BNP, PROBNP in the last 168 hours.   DDimer No results for input(s): DDIMER in the last 168 hours.   Radiology    DG Chest 2 View  Result Date: 05/20/2021 CLINICAL DATA:  Shortness of breath. EXAM: CHEST - 2 VIEW COMPARISON:  01/28/2021 FINDINGS: The heart is within normal limits in size. There is mild tortuosity and age advanced calcification involving the thoracic aorta. The pulmonary hila appear normal. The lungs are clear. No pleural effusions or pulmonary lesions. The bony thorax is intact. IMPRESSION: No acute cardiopulmonary findings. Electronically Signed   By: Marijo Sanes M.D.   On: 05/20/2021 07:29   CT Head Wo Contrast  Result Date: 05/20/2021 CLINICAL DATA:  Headache EXAM: CT HEAD WITHOUT CONTRAST TECHNIQUE: Contiguous  axial images were obtained from the base of the skull through the vertex without intravenous contrast. COMPARISON:  06/06/2019 FINDINGS: Brain: Again noted is area of low attenuation within the right anterior centrum semiovale compatible with old infarct, unchanged since prior study. No acute intracranial abnormality. Specifically, no hemorrhage, hydrocephalus, mass lesion, acute infarction, or significant intracranial injury. Vascular: No hyperdense vessel or unexpected calcification. Skull: No acute calvarial abnormality. Sinuses/Orbits: No acute findings Other: None IMPRESSION: No acute intracranial abnormality. Electronically Signed   By: Rolm Baptise M.D.   On: 05/20/2021 12:12    Cardiac Studies   Echo: 11/2018   IMPRESSIONS     1. The left ventricle has normal systolic function with an ejection  fraction of 60-65%. The cavity size was normal. There is mild concentric  left ventricular hypertrophy. Left ventricular diastolic Doppler  parameters are consistent with  pseudonormalization. Elevated left ventricular end-diastolic pressure No  evidence of left ventricular regional wall motion abnormalities.   2. The right ventricle has normal systolic function. The cavity was  normal. There is no increase in right ventricular wall thickness.   3. Left atrial size was mildly dilated.   4. The pericardial effusion is localized near the right atrium.   5. Trivial pericardial effusion is present.   6. The aortic valve is tricuspid. Mild sclerosis of the aortic valve.  Aortic valve regurgitation is trivial by color flow Doppler.   FINDINGS   Left Ventricle: The left ventricle has normal systolic function, with an  ejection fraction of 60-65%. The cavity size was normal. There is mild  concentric left ventricular hypertrophy. Left ventricular diastolic  Doppler parameters are consistent with  pseudonormalization. Elevated left ventricular end-diastolic pressure No  evidence of left ventricular  regional wall motion abnormalities..  Right Ventricle: The right ventricle has normal systolic function. The  cavity was normal. There is no increase in right ventricular wall  thickness.  Left Atrium: Left atrial size was mildly dilated.  Right Atrium: Right atrial size was normal in size. Right atrial pressure  is estimated at 3 mmHg.  Interatrial Septum: No atrial level  shunt detected by color flow Doppler.  Pericardium: Trivial pericardial effusion is present. The pericardial  effusion is localized near the right atrium.  Mitral Valve: The mitral valve is normal in structure. Mitral valve  regurgitation is trivial by color flow Doppler.  Tricuspid Valve: The tricuspid valve is normal in structure. Tricuspid  valve regurgitation is trivial by color flow Doppler.  Aortic Valve: The aortic valve is tricuspid Mild sclerosis of the aortic  valve. Aortic valve regurgitation is trivial by color flow Doppler.  Pulmonic Valve: The pulmonic valve was normal in structure. Pulmonic valve  regurgitation is not visualized by color flow Doppler.  Venous: The inferior vena cava is normal in size with greater than 50%  respiratory variability.      Patient Profile     45 y.o. female hx of ESRD on HD, HIV, HTN, DM, CVA, PAD, bilateral leg amps, left AKA, Right BKA with wound vac to the right, who is being follow by cardiology for NSTEMI  Assessment & Plan    NSTEMI - elevated high sensitivity troponin and chest pain. She is a high risk patient, plan for LHC today. Continue on hep gtt. She is on ASA 81 mg and Plavix 75 mg daily, continue statin and beta blocker. She is agreeable to the [facility].   Shared Decision Making/Informed Consent The risks [stroke (1 in 1000), death (1 in 1000), kidney failure [usually temporary] (1 in 500), bleeding (1 in 200), allergic reaction [possibly serious] (1 in 200)], benefits (diagnostic support and management of coronary artery disease) and alternatives of a cardiac  catheterization were discussed in detail with Ms. Gongwer and she is willing to proceed.  Hypertension - her blood pressure is not at target. Lopressor was started yesterday.She is also hydralazine, can increase hydralazine to 75 mg TID.  ESRD on HD   DM - continue with sliding scale insulin per primary team.  PAD - s/p Left AKA, Right BKA with wound vac. Continue on antiplatelets with Aspirin/Plavix  HIV- antiretroviral therapy per primary team.     For questions or updates, please contact [place] Please consult [email or web address] for contact info under Cardiology/STEMI.      Signed, Berniece Salines, DO  05/21/2021, 8:48 AM

## 2021-05-21 NOTE — Progress Notes (Signed)
HD#0 SUBJECTIVE:  Patient Summary: Kathy Frank is a 45 y.o. with a pertinent PMH of  hypertension, diabetes, prior CVA, HIV, and ESRD (THS) who presented to [facility] ED on 05/20/21 for acute onset headache, chest pain and shortness of breath on hospital day 1.  Overnight Events: Patient states that during her hemodialysis last night she continued to have headache.  This worsened with increased blood pressure.  Interim History:   This AM, Kathy Frank states she is still having head pressure with right worse than left but no additional pain. She had SOB but it has resolved. She notes that her head pressure during dialysis was worse and that's why it was stopped.   We discussed the plan for the day including heart cath for NSTEMI.   OBJECTIVE:  Vital Signs: Vitals:   05/20/21 1759 05/20/21 2015 05/20/21 2352 05/21/21 0422  BP:  (!) 171/81 (!) 149/78 (!) 159/89  Pulse:  94 76 76  Resp:  '15 15 15  '$ Temp: 98.3 F (36.8 C) 98.6 F (37 C) 98.9 F (37.2 C) 98 F (36.7 C)  TempSrc: Oral Oral Oral Oral  SpO2:  96% 99% 99%  Weight:      Height:       Supplemental O2: Room Air SpO2: 99 %  Filed Weights   05/20/21 1200  Weight: 68 kg     Intake/Output Summary (Last 24 hours) at 05/21/2021 0645 Last data filed at 05/20/2021 2130 Gross per 24 hour  Intake 0 ml  Output 2136 ml  Net -2136 ml   Net IO Since Admission: -2,136 mL [05/21/21 0645]  Physical Exam: General: Well-developed, well-nourished HENT: NCAT CV: normal rate, no murmurs, rubs, or gallops, palpable thrill overlying left upper arm fistula Pulm: CTAB, normal pulmonary effort GI: soft, nondistended, no tenderness MSK: left AKA, right AKA with wound vac  Neuro: alert and oriented x4, normal speech pattern and articulation Psych: normal mood and affect  Patient Lines/Drains/Airways Status     Active Line/Drains/Airways     Name Placement date Placement time Site Days   Peripheral IV 05/21/21 20 G 1"  Anterior;Right Forearm 05/21/21  0452  Forearm  less than 1   Peripheral IV 05/21/21 20 G 2.5" Anterior;Proximal;Right;Upper Arm 05/21/21  0453  Arm  less than 1   Fistula / Graft Left Forearm Arteriovenous fistula 04/14/19  1642  Forearm  768   Fistula / Graft Left Upper arm --  --  Upper arm  --   Negative Pressure Wound Therapy 04/18/21  1444  --  33   Negative Pressure Wound Therapy Leg Anterior;Right 05/20/21  0000  --  1   External Urinary Catheter 05/20/21  1810  --  1   Incision (Closed) 04/14/19 Arm Left 04/14/19  1304  -- 768   Incision (Closed) 01/31/21 Leg Right 01/31/21  1143  -- 110   Incision (Closed) 03/28/21 Thigh Right 03/28/21  0835  -- 54   Incision (Closed) 04/18/21 Leg Right 04/18/21  1440  -- 33            Pertinent Labs: CBC Latest Ref Rng & Units 05/20/2021 05/20/2021 04/22/2021  WBC 4.0 - 10.5 K/uL - 7.7 6.3  Hemoglobin 12.0 - 15.0 g/dL 11.6(L) 9.9(L) 8.1(L)  Hematocrit 36.0 - 46.0 % 34.0(L) 33.1(L) 25.1(L)  Platelets 150 - 400 K/uL - 385 333    CMP Latest Ref Rng & Units 05/20/2021 05/20/2021 05/20/2021  Glucose 70 - 99 mg/dL - - 113(H)  BUN  6 - 20 mg/dL - - 68(H)  Creatinine 0.44 - 1.00 mg/dL - 9.25(H) 8.85(H)  Sodium 135 - 145 mmol/L 137 - 140  Potassium 3.5 - 5.1 mmol/L 5.2(H) - 5.8(H)  Chloride 98 - 111 mmol/L - - 96(L)  CO2 22 - 32 mmol/L - - 19(L)  Calcium 8.9 - 10.3 mg/dL - - 8.2(L)  Total Protein 6.5 - 8.1 g/dL - - 9.2(H)  Total Bilirubin 0.3 - 1.2 mg/dL - - 1.0  Alkaline Phos 38 - 126 U/L - - 76  AST 15 - 41 U/L - - 15  ALT 0 - 44 U/L - - 9    Recent Labs    05/20/21 2043  GLUCAP 123*     Pertinent Imaging: DG Chest 2 View  Result Date: 05/20/2021 CLINICAL DATA:  Shortness of breath. EXAM: CHEST - 2 VIEW COMPARISON:  01/28/2021 FINDINGS: The heart is within normal limits in size. There is mild tortuosity and age advanced calcification involving the thoracic aorta. The pulmonary hila appear normal. The lungs are clear. No pleural  effusions or pulmonary lesions. The bony thorax is intact. IMPRESSION: No acute cardiopulmonary findings. Electronically Signed   By: Marijo Sanes M.D.   On: 05/20/2021 07:29   CT Head Wo Contrast  Result Date: 05/20/2021 CLINICAL DATA:  Headache EXAM: CT HEAD WITHOUT CONTRAST TECHNIQUE: Contiguous axial images were obtained from the base of the skull through the vertex without intravenous contrast. COMPARISON:  06/06/2019 FINDINGS: Brain: Again noted is area of low attenuation within the right anterior centrum semiovale compatible with old infarct, unchanged since prior study. No acute intracranial abnormality. Specifically, no hemorrhage, hydrocephalus, mass lesion, acute infarction, or significant intracranial injury. Vascular: No hyperdense vessel or unexpected calcification. Skull: No acute calvarial abnormality. Sinuses/Orbits: No acute findings Other: None IMPRESSION: No acute intracranial abnormality. Electronically Signed   By: Rolm Baptise M.D.   On: 05/20/2021 12:12    ASSESSMENT/PLAN:  Assessment: Active Problems:   Diabetes mellitus type 2 in obese (HCC)   Hypertension   ESRD (end stage renal disease) ([place])   Peripheral arterial disease (HCC)   BKA stump complication (HCC)   Hypertensive urgency   NSTEMI (non-ST elevated myocardial infarction) ([place])  Kathy Frank is a 45 y.o. with a pertinent PMH of  hypertension, diabetes, prior CVA, HIV, and ESRD (THS) who presented to [facility] ED on 05/20/21 for acute onset headache, chest pain and shortness of breath on hospital day 1.  Plan: #NSTEMI #Hypertensive urgency with associated headache and shortness of breath Patient presented due to headache, shortness of breath, and chest pain.  EKG with no significant ischemic changes.  Troponin elevated, maximum at 967 and downtrended to 805 on recheck. Cardiology consulted and evaluated patient.  Cardiac cath today showed 75% blockage of LAD and stent was placed.    -Home blood pressure  restarted following dialysis last night- amlodipine 5 mg, hydralazine 50 mg TID.  Increased hydralazine to 75 mg due to continued elevated BP. -Tele monitoring -Avoid QT prolonging agents   #ESRD (THS) #AGMA-resolved  Patient was able to tolerate dialysis yesterday evening.  Home blood pressure medications were restarted following dialysis.  Plans for next hemodialysis session on Thursday.  -Nephology following  #Diabetes Mellitus Continued home Lantus 8 units nightly, hold Victoza. Blood glucose today well controlled in the low 100s.  -Continue on sliding scale insulin  Best Practice: Diet: Cardiac diet IVF: Fluids: none VTE: on heparin Code: Full AB: none Therapy Recs: Pending DISPO: Anticipated  discharge in 3-4 days to Home pending Medical stability.  Signature: Christiana Fuchs, D.O. Internal Medicine Resident, PGY-1 [facility]  Pager: [phone] 6:45 AM, 05/21/2021   Please contact the on call pager after 5 pm and on weekends at [phone].

## 2021-05-21 NOTE — Care Management Obs Status (Signed)
[facility] NOTIFICATION   Patient Details  Name: ADAIRA WESTERVELT MRN: [id number] Date of Birth: 01/20/1976   Medicare Observation Status Notification Given:  Yes    Cyndi Bender, RN 05/21/2021, 10:59 AM

## 2021-05-21 NOTE — Progress Notes (Signed)
  Echocardiogram 2D Echocardiogram has been performed.  Kathy Frank 05/21/2021, 9:12 AM

## 2021-05-21 NOTE — Plan of Care (Signed)
  Problem: Education: Goal: Knowledge of General Education information will improve Description: Including pain rating scale, medication(s)/side effects and non-pharmacologic comfort measures Outcome: Progressing   Problem: Health Behavior/Discharge Planning: Goal: Ability to manage health-related needs will improve Outcome: Progressing   Problem: Clinical Measurements: Goal: Ability to maintain clinical measurements within normal limits will improve Outcome: Progressing   Problem: Clinical Measurements: Goal: Diagnostic test results will improve Outcome: Progressing   Problem: Clinical Measurements: Goal: Cardiovascular complication will be avoided Outcome: Progressing   Problem: Clinical Measurements: Goal: Respiratory complications will improve Outcome: Progressing   Problem: Activity: Goal: Risk for activity intolerance will decrease Outcome: Progressing   Problem: Pain Managment: Goal: General experience of comfort will improve Outcome: Progressing   Problem: Safety: Goal: Ability to remain free from injury will improve Outcome: Progressing   Problem: Skin Integrity: Goal: Risk for impaired skin integrity will decrease Outcome: Progressing   Problem: Cardiac: Goal: Ability to achieve and maintain adequate cardiopulmonary perfusion will improve Outcome: Progressing

## 2021-05-21 NOTE — H&P (View-Only) (Signed)
Progress Note  Patient Name: Kathy Frank Date of Encounter: 05/21/2021  Primary Cardiologist: None   Subjective   Patient seen and examined at her bedside. She was awake in bed when I arrived. She denies and chest pain. She is aware of her LHC today. No other questions at this time.  Inpatient Medications    Scheduled Meds:  amLODipine  10 mg Oral Daily   aspirin EC  81 mg Oral Daily   atorvastatin  40 mg Oral Daily   bictegravir-emtricitabine-tenofovir AF  1 tablet Oral Daily   calcitRIOL  0.25 mcg Oral Q T,Th,Sa-HD   calcium acetate  2,001 mg Oral TID WC   Chlorhexidine Gluconate Cloth  6 each Topical Q0600   clopidogrel  75 mg Oral Daily   hydrALAZINE  50 mg Oral TID   insulin aspart  0-6 Units Subcutaneous TID WC   insulin glargine-yfgn  8 Units Subcutaneous QHS   metoprolol tartrate  25 mg Oral BID   mupirocin ointment  1 application Nasal BID   pantoprazole  40 mg Oral Daily   sertraline  50 mg Oral QHS   sodium chloride flush  3 mL Intravenous Q12H   sodium zirconium cyclosilicate  10 g Oral Daily   Continuous Infusions:  sodium chloride     sodium chloride 10 mL/hr at 05/21/21 0505   heparin 800 Units/hr (05/21/21 0509)   PRN Meds: sodium chloride, acetaminophen **OR** acetaminophen, labetalol, polyethylene glycol, sodium chloride flush   Vital Signs    Vitals:   05/20/21 2352 05/21/21 0422 05/21/21 0500 05/21/21 0817  BP: (!) 149/78 (!) 159/89  139/68  Pulse: 76 76  81  Resp: '15 15  18  '$ Temp: 98.9 F (37.2 C) 98 F (36.7 C)  98.3 F (36.8 C)  TempSrc: Oral Oral  Oral  SpO2: 99% 99%  100%  Weight:   69.3 kg   Height:        Intake/Output Summary (Last 24 hours) at 05/21/2021 0848 Last data filed at 05/20/2021 2130 Gross per 24 hour  Intake 0 ml  Output 2136 ml  Net -2136 ml   Filed Weights   05/20/21 1200 05/21/21 0500  Weight: 68 kg 69.3 kg    Telemetry    Sinus rhythm - Personally Reviewed  ECG    Ecg showed sinus rhythm  with HR 90 bpm with evidence of old anterior wall  - Personally Reviewed  Physical Exam   GEN: No acute distress.   Neck: No JVD Cardiac: RRR, no murmurs, rubs, or gallops.  Respiratory: Clear to auscultation bilaterally. GI: Soft, nontender, non-distended  MS: Left AKA, Right BKA with wound vac Neuro:  Nonfocal  Psych: Normal affect  Skin with tattoos,  Labs    Chemistry Recent Labs  Lab 05/20/21 0700 05/20/21 1214 05/20/21 1243  NA 140  --  137  K 5.8*  --  5.2*  CL 96*  --   --   CO2 19*  --   --   GLUCOSE 113*  --   --   BUN 68*  --   --   CREATININE 8.85* 9.25*  --   CALCIUM 8.2*  --   --   PROT 9.2*  --   --   ALBUMIN 3.3*  --   --   AST 15  --   --   ALT 9  --   --   ALKPHOS 76  --   --   BILITOT 1.0  --   --  GFRNONAA 5* 5*  --   ANIONGAP 25*  --   --      Hematology Recent Labs  Lab 05/20/21 0700 05/20/21 1243  WBC 7.7  --   RBC 3.36*  --   HGB 9.9* 11.6*  HCT 33.1* 34.0*  MCV 98.5  --   MCH 29.5  --   MCHC 29.9*  --   RDW 19.8*  --   PLT 385  --     Cardiac EnzymesNo results for input(s): TROPONINI in the last 168 hours. No results for input(s): TROPIPOC in the last 168 hours.   BNPNo results for input(s): BNP, PROBNP in the last 168 hours.   DDimer No results for input(s): DDIMER in the last 168 hours.   Radiology    DG Chest 2 View  Result Date: 05/20/2021 CLINICAL DATA:  Shortness of breath. EXAM: CHEST - 2 VIEW COMPARISON:  01/28/2021 FINDINGS: The heart is within normal limits in size. There is mild tortuosity and age advanced calcification involving the thoracic aorta. The pulmonary hila appear normal. The lungs are clear. No pleural effusions or pulmonary lesions. The bony thorax is intact. IMPRESSION: No acute cardiopulmonary findings. Electronically Signed   By: Marijo Sanes M.D.   On: 05/20/2021 07:29   CT Head Wo Contrast  Result Date: 05/20/2021 CLINICAL DATA:  Headache EXAM: CT HEAD WITHOUT CONTRAST TECHNIQUE: Contiguous  axial images were obtained from the base of the skull through the vertex without intravenous contrast. COMPARISON:  06/06/2019 FINDINGS: Brain: Again noted is area of low attenuation within the right anterior centrum semiovale compatible with old infarct, unchanged since prior study. No acute intracranial abnormality. Specifically, no hemorrhage, hydrocephalus, mass lesion, acute infarction, or significant intracranial injury. Vascular: No hyperdense vessel or unexpected calcification. Skull: No acute calvarial abnormality. Sinuses/Orbits: No acute findings Other: None IMPRESSION: No acute intracranial abnormality. Electronically Signed   By: Rolm Baptise M.D.   On: 05/20/2021 12:12    Cardiac Studies   Echo: 11/2018   IMPRESSIONS     1. The left ventricle has normal systolic function with an ejection  fraction of 60-65%. The cavity size was normal. There is mild concentric  left ventricular hypertrophy. Left ventricular diastolic Doppler  parameters are consistent with  pseudonormalization. Elevated left ventricular end-diastolic pressure No  evidence of left ventricular regional wall motion abnormalities.   2. The right ventricle has normal systolic function. The cavity was  normal. There is no increase in right ventricular wall thickness.   3. Left atrial size was mildly dilated.   4. The pericardial effusion is localized near the right atrium.   5. Trivial pericardial effusion is present.   6. The aortic valve is tricuspid. Mild sclerosis of the aortic valve.  Aortic valve regurgitation is trivial by color flow Doppler.   FINDINGS   Left Ventricle: The left ventricle has normal systolic function, with an  ejection fraction of 60-65%. The cavity size was normal. There is mild  concentric left ventricular hypertrophy. Left ventricular diastolic  Doppler parameters are consistent with  pseudonormalization. Elevated left ventricular end-diastolic pressure No  evidence of left ventricular  regional wall motion abnormalities..  Right Ventricle: The right ventricle has normal systolic function. The  cavity was normal. There is no increase in right ventricular wall  thickness.  Left Atrium: Left atrial size was mildly dilated.  Right Atrium: Right atrial size was normal in size. Right atrial pressure  is estimated at 3 mmHg.  Interatrial Septum: No atrial level  shunt detected by color flow Doppler.  Pericardium: Trivial pericardial effusion is present. The pericardial  effusion is localized near the right atrium.  Mitral Valve: The mitral valve is normal in structure. Mitral valve  regurgitation is trivial by color flow Doppler.  Tricuspid Valve: The tricuspid valve is normal in structure. Tricuspid  valve regurgitation is trivial by color flow Doppler.  Aortic Valve: The aortic valve is tricuspid Mild sclerosis of the aortic  valve. Aortic valve regurgitation is trivial by color flow Doppler.  Pulmonic Valve: The pulmonic valve was normal in structure. Pulmonic valve  regurgitation is not visualized by color flow Doppler.  Venous: The inferior vena cava is normal in size with greater than 50%  respiratory variability.      Patient Profile     45 y.o. female hx of ESRD on HD, HIV, HTN, DM, CVA, PAD, bilateral leg amps, left AKA, Right BKA with wound vac to the right, who is being follow by cardiology for NSTEMI  Assessment & Plan    NSTEMI - elevated high sensitivity troponin and chest pain. She is a high risk patient, plan for LHC today. Continue on hep gtt. She is on ASA 81 mg and Plavix 75 mg daily, continue statin and beta blocker. She is agreeable to the [facility].   Shared Decision Making/Informed Consent The risks [stroke (1 in 1000), death (1 in 1000), kidney failure [usually temporary] (1 in 500), bleeding (1 in 200), allergic reaction [possibly serious] (1 in 200)], benefits (diagnostic support and management of coronary artery disease) and alternatives of a cardiac  catheterization were discussed in detail with Ms. Bezold and she is willing to proceed.  Hypertension - her blood pressure is not at target. Lopressor was started yesterday.She is also hydralazine, can increase hydralazine to 75 mg TID.  ESRD on HD   DM - continue with sliding scale insulin per primary team.  PAD - s/p Left AKA, Right BKA with wound vac. Continue on antiplatelets with Aspirin/Plavix  HIV- antiretroviral therapy per primary team.     For questions or updates, please contact [place] Please consult [email or web address] for contact info under Cardiology/STEMI.      Signed, Berniece Salines, DO  05/21/2021, 8:48 AM

## 2021-05-21 NOTE — Interval H&P Note (Signed)
History and Physical Interval Note:  05/21/2021 12:28 PM  Kathy Frank  has presented today for surgery, with the diagnosis of nstemi.  The various methods of treatment have been discussed with the patient and family. After consideration of risks, benefits and other options for treatment, the patient has consented to  Procedure(s): LEFT HEART CATH AND CORONARY ANGIOGRAPHY (N/A)  PERCUTANEOUS CORONARY INTERVENTION  as a surgical intervention.  The patient's history has been reviewed, patient examined, no change in status, stable for surgery.  I have reviewed the patient's chart and labs.  Questions were answered to the patient's satisfaction.    Cath Lab Visit (complete for each Cath Lab visit)  Clinical Evaluation Leading to the Procedure:   ACS: Yes.    Non-ACS:    Anginal Classification: CCS IV  Anti-ischemic medical therapy: Maximal Therapy (2 or more classes of medications)  Non-Invasive Test Results: No non-invasive testing performed  Prior CABG: No previous CABG   Glenetta Hew

## 2021-05-22 ENCOUNTER — Telehealth: Payer: Self-pay

## 2021-05-22 ENCOUNTER — Other Ambulatory Visit: Payer: Self-pay | Admitting: Internal Medicine

## 2021-05-22 ENCOUNTER — Other Ambulatory Visit (HOSPITAL_COMMUNITY): Payer: Self-pay

## 2021-05-22 ENCOUNTER — Encounter (HOSPITAL_COMMUNITY): Payer: Self-pay | Admitting: Cardiology

## 2021-05-22 DIAGNOSIS — B20 Human immunodeficiency virus [HIV] disease: Secondary | ICD-10-CM

## 2021-05-22 DIAGNOSIS — I214 Non-ST elevation (NSTEMI) myocardial infarction: Secondary | ICD-10-CM | POA: Diagnosis not present

## 2021-05-22 DIAGNOSIS — I25110 Atherosclerotic heart disease of native coronary artery with unstable angina pectoris: Secondary | ICD-10-CM | POA: Diagnosis not present

## 2021-05-22 LAB — COMPREHENSIVE METABOLIC PANEL
ALT: 8 U/L (ref 0–44)
AST: 10 U/L — ABNORMAL LOW (ref 15–41)
Albumin: 3 g/dL — ABNORMAL LOW (ref 3.5–5.0)
Alkaline Phosphatase: 71 U/L (ref 38–126)
Anion gap: 17 — ABNORMAL HIGH (ref 5–15)
BUN: 41 mg/dL — ABNORMAL HIGH (ref 6–20)
CO2: 21 mmol/L — ABNORMAL LOW (ref 22–32)
Calcium: 9 mg/dL (ref 8.9–10.3)
Chloride: 93 mmol/L — ABNORMAL LOW (ref 98–111)
Creatinine, Ser: 7.22 mg/dL — ABNORMAL HIGH (ref 0.44–1.00)
GFR, Estimated: 7 mL/min — ABNORMAL LOW (ref >60)
Glucose, Bld: 115 mg/dL — ABNORMAL HIGH (ref 70–99)
Potassium: 4.5 mmol/L (ref 3.5–5.1)
Sodium: 131 mmol/L — ABNORMAL LOW (ref 135–145)
Total Bilirubin: 0.7 mg/dL (ref 0.3–1.2)
Total Protein: 8 g/dL (ref 6.5–8.1)

## 2021-05-22 LAB — LIPID PANEL
Cholesterol: 177 mg/dL (ref 0–200)
HDL: 38 mg/dL — ABNORMAL LOW (ref >40)
LDL Cholesterol: 96 mg/dL (ref 0–99)
Total CHOL/HDL Ratio: 4.7 RATIO
Triglycerides: 214 mg/dL — ABNORMAL HIGH (ref <150)
VLDL: 43 mg/dL — ABNORMAL HIGH (ref 0–40)

## 2021-05-22 LAB — CBC
HCT: 34.3 % — ABNORMAL LOW (ref 36.0–46.0)
Hemoglobin: 10.7 g/dL — ABNORMAL LOW (ref 12.0–15.0)
MCH: 29.6 pg (ref 26.0–34.0)
MCHC: 31.2 g/dL (ref 30.0–36.0)
MCV: 95 fL (ref 80.0–100.0)
Platelets: 420 10*3/uL — ABNORMAL HIGH (ref 150–400)
RBC: 3.61 MIL/uL — ABNORMAL LOW (ref 3.87–5.11)
RDW: 19.3 % — ABNORMAL HIGH (ref 11.5–15.5)
WBC: 5.6 10*3/uL (ref 4.0–10.5)
nRBC: 0.4 % — ABNORMAL HIGH (ref 0.0–0.2)

## 2021-05-22 LAB — HEPARIN LEVEL (UNFRACTIONATED): Heparin Unfractionated: <0.1 IU/mL — ABNORMAL LOW (ref 0.30–0.70)

## 2021-05-22 LAB — GLUCOSE, CAPILLARY
Glucose-Capillary: 135 mg/dL — ABNORMAL HIGH (ref 70–99)
Glucose-Capillary: 136 mg/dL — ABNORMAL HIGH (ref 70–99)
Glucose-Capillary: 272 mg/dL — ABNORMAL HIGH (ref 70–99)
Glucose-Capillary: 349 mg/dL — ABNORMAL HIGH (ref 70–99)

## 2021-05-22 MED ORDER — METOCLOPRAMIDE HCL 5 MG/ML IJ SOLN
0.3300 mg | Freq: Once | INTRAMUSCULAR | Status: AC
  Administered 2021-05-22: 0.35 mg via INTRAVENOUS
  Filled 2021-05-22: qty 2

## 2021-05-22 MED ORDER — DIPHENHYDRAMINE HCL 50 MG/ML IJ SOLN
50.0000 mg | Freq: Once | INTRAMUSCULAR | Status: AC
  Administered 2021-05-22: 50 mg via INTRAVENOUS
  Filled 2021-05-22: qty 1

## 2021-05-22 MED ORDER — METHOCARBAMOL 500 MG PO TABS
500.0000 mg | ORAL_TABLET | Freq: Once | ORAL | Status: AC
  Administered 2021-05-22: 500 mg via ORAL
  Filled 2021-05-22: qty 1

## 2021-05-22 MED ORDER — GABAPENTIN 100 MG PO CAPS
100.0000 mg | ORAL_CAPSULE | Freq: Once | ORAL | Status: AC
  Administered 2021-05-22: 100 mg via ORAL
  Filled 2021-05-22: qty 1

## 2021-05-22 MED ORDER — METOCLOPRAMIDE HCL 5 MG/ML IJ SOLN
3.3000 mg | Freq: Once | INTRAMUSCULAR | Status: AC
  Administered 2021-05-22: 3.3 mg via INTRAVENOUS
  Filled 2021-05-22: qty 2

## 2021-05-22 MED ORDER — DEXAMETHASONE 6 MG PO TABS
6.0000 mg | ORAL_TABLET | Freq: Once | ORAL | Status: AC
  Administered 2021-05-22: 6 mg via ORAL
  Filled 2021-05-22 (×2): qty 1

## 2021-05-22 MED ORDER — DEXAMETHASONE 6 MG PO TABS
6.0000 mg | ORAL_TABLET | Freq: Once | ORAL | Status: AC
  Administered 2021-05-22: 6 mg via ORAL
  Filled 2021-05-22: qty 1

## 2021-05-22 MED ORDER — DIPHENHYDRAMINE HCL 25 MG PO CAPS
50.0000 mg | ORAL_CAPSULE | Freq: Once | ORAL | Status: AC
  Administered 2021-05-22: 50 mg via ORAL
  Filled 2021-05-22: qty 2

## 2021-05-22 MED FILL — Nitroglycerin IV Soln 100 MCG/ML in D5W: INTRA_ARTERIAL | Qty: 10 | Status: AC

## 2021-05-22 NOTE — Progress Notes (Signed)
CARDIAC REHAB PHASE I   Stent/MI education completed with pt. Pt educated on importance of ASA and Plavix. Pt given MI book. Deferred diet to dialysis center. Reviewed site care and restrictions. Referred to [facility] [place] to meet the requirements with knowledge pt will be unable to attend.  [id number] Rufina Falco, RN BSN 05/22/2021 1:42 PM

## 2021-05-22 NOTE — Telephone Encounter (Signed)
DR Masters requested a Hospital f/u for pt being discharged today was given 10/18 @ 3:15 with Dr Jeanice Lim

## 2021-05-22 NOTE — Progress Notes (Signed)
HD#1 SUBJECTIVE:  Patient Summary: Kathy Frank is a 45 y.o. with a pertinent PMH of  hypertension, diabetes, prior CVA, HIV, and ESRD (THS) who presented to [facility] ED on 05/20/21 for acute onset headache, chest pain and shortness of breath on hospital day 1.  She was admitted due to NSTEMI and hypertensive urgency.  Received PCI to LAD on October 12.  Was stable for discharge following dialysis from a cardiac standpoint, however she had continued headache at was worse than on admission.   Overnight Events: None  Interim History:  Patient was initially seen in hemodialysis unit, she was visibly pain due to her headache.  Her eyes were shot with grimacing.  Ordered migraine cocktail, with Reglan, Benadryl, and Decadron.  Reevaluated patient following medication administration and she stated that her headache had persisted.  OBJECTIVE:  Vital Signs: Vitals:   05/22/21 1200 05/22/21 1211 05/22/21 1220 05/22/21 1320  BP: 126/63 (!) 145/76 110/68 (!) 153/69  Pulse: 81 77 82 86  Resp: 12 (!) '22 14 11  '$ Temp:  98.1 F (36.7 C) 98.1 F (36.7 C) 97.8 F (36.6 C)  TempSrc:    Oral  SpO2:      Weight:   66.6 kg   Height:       Supplemental O2: Room Air SpO2: 100 % O2 Flow Rate (L/min): 2 L/min  Filed Weights   05/22/21 0500 05/22/21 0827 05/22/21 1220  Weight: 70 kg 67.8 kg 66.6 kg     Intake/Output Summary (Last 24 hours) at 05/22/2021 1756 Last data filed at 05/22/2021 1220 Gross per 24 hour  Intake 474.66 ml  Output 1365 ml  Net -890.34 ml   Net IO Since Admission: -3,026.34 mL [05/22/21 1756]  Physical Exam: Physical Exam Constitutional:      General: She is in acute distress.     Comments: Grimacing due to migraine  HENT:     Head: Normocephalic and atraumatic.     Mouth/Throat:     Mouth: Mucous membranes are moist.     Pharynx: Oropharynx is clear.  Cardiovascular:     Rate and Rhythm: Normal rate and regular rhythm.  Pulmonary:     Effort: Pulmonary  effort is normal.     Breath sounds: Normal breath sounds.  Abdominal:     Palpations: Abdomen is soft.     Comments: Bowel sounds decreased in left lower quadrant  Musculoskeletal:     Comments: Left AKA, right BKA wound vac in place  Skin:    General: Skin is warm and dry.  Neurological:     General: No focal deficit present.     Mental Status: She is alert.    Patient Lines/Drains/Airways Status     Active Line/Drains/Airways     Name Placement date Placement time Site Days   Peripheral IV 05/21/21 20 G 1" Anterior;Right Forearm 05/21/21  0452  Forearm  1   Peripheral IV 05/21/21 20 G 2.5" Anterior;Proximal;Right;Upper Arm 05/21/21  0453  Arm  1   Fistula / Graft Left Forearm Arteriovenous fistula 04/14/19  1642  Forearm  769   Fistula / Graft Left Upper arm --  --  Upper arm  --   Negative Pressure Wound Therapy 04/18/21  1444  --  34   Negative Pressure Wound Therapy Leg Anterior;Right 05/20/21  0000  --  2   External Urinary Catheter 05/20/21  1810  --  2   Incision (Closed) 04/14/19 Arm Left 04/14/19  1304  --  769   Incision (Closed) 01/31/21 Leg Right 01/31/21  1143  -- 111   Incision (Closed) 03/28/21 Thigh Right 03/28/21  0835  -- 55   Incision (Closed) 04/18/21 Leg Right 04/18/21  1440  -- 34            Pertinent Labs: CBC Latest Ref Rng & Units 05/22/2021 05/20/2021 05/20/2021  WBC 4.0 - 10.5 K/uL 5.6 - 7.7  Hemoglobin 12.0 - 15.0 g/dL 10.7(L) 11.6(L) 9.9(L)  Hematocrit 36.0 - 46.0 % 34.3(L) 34.0(L) 33.1(L)  Platelets 150 - 400 K/uL 420(H) - 385    CMP Latest Ref Rng & Units 05/22/2021 05/21/2021 05/20/2021  Glucose 70 - 99 mg/dL 115(H) 113(H) -  BUN 6 - 20 mg/dL 41(H) 32(H) -  Creatinine 0.44 - 1.00 mg/dL 7.22(H) 5.55(H) -  Sodium 135 - 145 mmol/L 131(L) 133(L) 137  Potassium 3.5 - 5.1 mmol/L 4.5 4.7 5.2(H)  Chloride 98 - 111 mmol/L 93(L) 94(L) -  CO2 22 - 32 mmol/L 21(L) 24 -  Calcium 8.9 - 10.3 mg/dL 9.0 8.7(L) -  Total Protein 6.5 - 8.1 g/dL 8.0 -  -  Total Bilirubin 0.3 - 1.2 mg/dL 0.7 - -  Alkaline Phos 38 - 126 U/L 71 - -  AST 15 - 41 U/L 10(L) - -  ALT 0 - 44 U/L 8 - -    Recent Labs    05/22/21 0745 05/22/21 1316 05/22/21 1642  GLUCAP 135* 136* 272*     Pertinent Imaging: No results found.  ASSESSMENT/PLAN:  Assessment: Active Problems:   Diabetes mellitus type 2 in obese (HCC)   Hypertension   ESRD (end stage renal disease) ([place])   Peripheral arterial disease (HCC)   BKA stump complication (HCC)   Hypertensive urgency   NSTEMI (non-ST elevated myocardial infarction) ([place])   Coronary artery disease involving native coronary artery of native heart with unstable angina pectoris ([place])   Plan: 1.  NSTEMI A cardiac cath 05/21/2021 showed 75% blockage of LAD and stent was placed. She will need to continue 12 months(05/22/2021) of dual antiplatelet therapy including aspirin and Plavix. We will continue Atorvastatin 80 mg.  Follow-up on her lipid profile-if LDL> 50 add Zetia 10 mg daily.  Follow-up with cardiology on June 06, 2021 with Sande Rives, PA and follow-up with the internal medicine clinic on October 18 at 3:15 PM.  -Follow-up on lipid panel, if LDL greater than 50 add Zetia 10 mg daily -Continue dual antiplatelet therapy including aspirin and Plavix -Continue atorvastatin 80 mg   2.  Hypertensive urgency with associated headache and shortness of breath #continued headache concerning for possible migraines Blood pressure on arrival at 207/98.  On evaluation today patient still had significant migraine while receiving hemodialysis.  She stated that it was pounding in nature and worsened with light.  Her blood pressure was well controlled at that time while on hemodialysis.  Characteristics of her headache are more likely related to migraine.  She states that she has never had migraines before.  Her CT head on admission was negative for acute bleeds.  Patient was given migraine cocktail including Decadron 6 mg  p.o., Reglan 10 mg IV, Benadryl 50 mg IV.  Toradol was not given due to her end-stage renal disease.  Evaluated patient 2 hours following medication administration and she stated that the medications had helped but her headache persisted.  Patient was given additional round of migraine cocktail.  We will consider adding Dilaudid if pain continues.  -Patient given 2 doses  of migraine cocktail, last dose at 6:03 PM -Consider adding Dilaudid if pain continues    3.  ESRD Patient receives dialysis Tuesday, Thursday, and Saturday at [facility].  Dialysis via left upper extremity aVF.  Her potassium was elevated at 5.8 on presentation on October 11 and patient received hemodialysis that evening with [facility].  She receives dialysis on Tuesday and Thursday while in the hospital.  Notified renal navigator that patient discharging and patient will continue outpatient hemodialysis at [facility] on Saturday.   -Patient scheduled for outpatient HD on Saturday   4.  Anion gap metabolic acidosis Anion gap elevated at 25.  Resolved   5.  Hyperkalemia without EKG changes Patient was taken for hemodialysis the evening of admission.    6.  Type 2 diabetes All medications include Lantus 10 units nightly and Victoza 0.6 mg.  Victoza was recently added at an internal medicine clinic office visit in September.  She will follow-up next week and at that time they may consider increasing GLP-1 dosage.   7.  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  She still has a wound VAC to her right extremity.  Patient discharged on Plavix and aspirin.  She will continue the aspirin for 1 year for her PCI and then continue Plavix due to PAD.   8.  Depression Zoloft continued while inpatient and continued at discharge   9.  GERD Protonix was continued at  discharge   10.  Anemia of ESRD Hemoglobin of 9.9.  On erythropoietin and iron load as outpatient   11.  HIV Continue Biktarvy at discharge.   12. Migraine Patient presented due to waking up with headache.  She was found to have hypertensive emergency and NSTEMI.  Headache was originally thought to be due to to elevated blood pressure, however headache continued despite improved blood pressure control.  She was given migraine cocktail with Decadron, Reglan, and Benadryl.  Will benefit from continued work-up for possible migraine.  Best Practice: Diet: Renal diet IVF: Fluids: none VTE: heparin injection 5,000 Units Start: 05/21/21 2200 Code: Full AB: none Therapy Recs: None Family Contact: Daughter was updated at bedside DISPO: Anticipated discharge tomorrow to Home pending  improvement of migraine .  Signature: Christiana Fuchs, D.O. Internal Medicine Resident, PGY-1 [facility]  Pager: [phone] 5:57 PM, 05/22/2021   Please contact the on call pager after 5 pm and on weekends at [phone].

## 2021-05-22 NOTE — Telephone Encounter (Signed)
Patient current admitted at the hospital

## 2021-05-22 NOTE — Progress Notes (Signed)
CARDIAC REHAB PHASE I   Went to completed stent education with pt. Pt out of room for procedure. Materials left at bedside. Will f/u when pt returns.  Rufina Falco, RN BSN 05/22/2021 8:11 AM

## 2021-05-22 NOTE — Progress Notes (Signed)
Progress Note  Patient Name: Kathy Frank Date of Encounter: 05/22/2021  Primary Cardiologist: None   Subjective   Patient seen and examined at her beside. Sitting up in bed eating breakfast. No complaints.  Inpatient Medications    Scheduled Meds:  amLODipine  10 mg Oral Daily   aspirin EC  81 mg Oral Daily   atorvastatin  80 mg Oral Daily   bictegravir-emtricitabine-tenofovir AF  1 tablet Oral Daily   calcitRIOL  0.25 mcg Oral Q T,Th,Sa-HD   calcium acetate  2,001 mg Oral TID WC   Chlorhexidine Gluconate Cloth  6 each Topical Q0600   clopidogrel  75 mg Oral Daily   heparin injection (subcutaneous)  5,000 Units Subcutaneous Q8H   hydrALAZINE  75 mg Oral TID   insulin aspart  0-6 Units Subcutaneous TID WC   insulin glargine-yfgn  8 Units Subcutaneous QHS   metoprolol tartrate  25 mg Oral BID   mupirocin ointment  1 application Nasal BID   pantoprazole  40 mg Oral Daily   sertraline  50 mg Oral QHS   sodium chloride flush  3 mL Intravenous Q12H   sodium chloride flush  3 mL Intravenous Q12H   sodium zirconium cyclosilicate  10 g Oral Daily   Continuous Infusions:  sodium chloride     sodium chloride     sodium chloride     PRN Meds: sodium chloride, sodium chloride, sodium chloride, acetaminophen **OR** acetaminophen, alteplase, fentaNYL (SUBLIMAZE) injection, heparin, heparin, labetalol, lidocaine (PF), lidocaine-prilocaine, ondansetron (ZOFRAN) IV, pentafluoroprop-tetrafluoroeth, polyethylene glycol, sodium chloride flush   Vital Signs    Vitals:   05/21/21 2100 05/22/21 0011 05/22/21 0500 05/22/21 0556  BP: (!) 161/78 (!) 156/80  (!) 143/87  Pulse:  70  70  Resp:  14  16  Temp:  98 F (36.7 C)  98.9 F (37.2 C)  TempSrc:  Oral  Oral  SpO2:  98%  100%  Weight:   70 kg   Height:        Intake/Output Summary (Last 24 hours) at 05/22/2021 0806 Last data filed at 05/22/2021 [id number] Gross per 24 hour  Intake 474.66 ml  Output 150 ml  Net 324.66 ml    Filed Weights   05/20/21 1200 05/21/21 0500 05/22/21 0500  Weight: 68 kg 69.3 kg 70 kg    Telemetry    Sinus rhythm HR 60-90 bpm - Personally Reviewed  ECG    None today - Personally Reviewed  Physical Exam   GEN: No acute distress.   Neck: No JVD Cardiac: RRR, no murmurs, rubs, or gallops.  Respiratory: Clear to auscultation bilaterally. GI: Soft, nontender, non-distended  MS: No edema; No deformity. Neuro:  Nonfocal  Psych: Normal affect   Labs    Chemistry Recent Labs  Lab 05/20/21 0700 05/20/21 1214 05/20/21 1243 05/21/21 0849 05/22/21 0317  NA 140  --  137 133* 131*  K 5.8*  --  5.2* 4.7 4.5  CL 96*  --   --  94* 93*  CO2 19*  --   --  24 21*  GLUCOSE 113*  --   --  113* 115*  BUN 68*  --   --  32* 41*  CREATININE 8.85* 9.25*  --  5.55* 7.22*  CALCIUM 8.2*  --   --  8.7* 9.0  PROT 9.2*  --   --   --  8.0  ALBUMIN 3.3*  --   --   --  3.0*  AST 15  --   --   --  10*  ALT 9  --   --   --  8  ALKPHOS 76  --   --   --  71  BILITOT 1.0  --   --   --  0.7  GFRNONAA 5* 5*  --  9* 7*  ANIONGAP 25*  --   --  15 17*     Hematology Recent Labs  Lab 05/20/21 0700 05/20/21 1243 05/22/21 0317  WBC 7.7  --  5.6  RBC 3.36*  --  3.61*  HGB 9.9* 11.6* 10.7*  HCT 33.1* 34.0* 34.3*  MCV 98.5  --  95.0  MCH 29.5  --  29.6  MCHC 29.9*  --  31.2  RDW 19.8*  --  19.3*  PLT 385  --  420*    Cardiac EnzymesNo results for input(s): TROPONINI in the last 168 hours. No results for input(s): TROPIPOC in the last 168 hours.   BNPNo results for input(s): BNP, PROBNP in the last 168 hours.   DDimer No results for input(s): DDIMER in the last 168 hours.   Radiology    CT Head Wo Contrast  Result Date: 05/20/2021 CLINICAL DATA:  Headache EXAM: CT HEAD WITHOUT CONTRAST TECHNIQUE: Contiguous axial images were obtained from the base of the skull through the vertex without intravenous contrast. COMPARISON:  06/06/2019 FINDINGS: Brain: Again noted is area of low  attenuation within the right anterior centrum semiovale compatible with old infarct, unchanged since prior study. No acute intracranial abnormality. Specifically, no hemorrhage, hydrocephalus, mass lesion, acute infarction, or significant intracranial injury. Vascular: No hyperdense vessel or unexpected calcification. Skull: No acute calvarial abnormality. Sinuses/Orbits: No acute findings Other: None IMPRESSION: No acute intracranial abnormality. Electronically Signed   By: Rolm Baptise M.D.   On: 05/20/2021 12:12   CARDIAC CATHETERIZATION  Result Date: 05/21/2021   Mid LAD lesion is 75% stenosed.   A drug-eluting stent was successfully placed using a Lincoln [id number].  Deployed to 3.0 mm   Post intervention, there is a 0% residual stenosis.   ---------------------------------   Prox RCA lesion is 35% stenosed.  Dist RCA lesion is 40% stenosed.   ---------------------------------   LV end diastolic pressure is normal.   There is no aortic valve stenosis. SUMMARY Severe single-vessel CAD with 70% mid LAD stenosis and moderate 30 to 40% proximal and distal RCA stenosis. Successful DES PCI of mid LAD using Onyx Frontier DES 2.7 by milligray 15 mm deployed to 3.0 mm.  Reduced lesion to 0% with TIMI-3 flow pre and post Normal LVEDP with normal EF by Echocardiogram. Recommendations: Transfer to 6 E. postprocedural unit for ongoing post cath/PCI care with sheath removal per protocol She is already on aspirin and Plavix, continue long-term therapy (uninterrupted for 1 year) Continue to titrate guideline directed medical management for CAD-have increased atorvastatin to 80 mg, Glenetta Hew, MD  ECHOCARDIOGRAM COMPLETE  Result Date: 05/21/2021    ECHOCARDIOGRAM REPORT   Patient Name:   Kathy Frank Date of Exam: 05/21/2021 Medical Rec #:  [id number]       Height:       65.0 in Accession #:    [id number]      Weight:       152.8 lb Date of Birth:  [birth year]        BSA:          1.764 m Patient Age:     45 years        BP:  139/68 mmHg Patient Gender: F               HR:           79 bpm. Exam Location:  Inpatient Procedure: 2D Echo Indications:    chest pain  History:        Patient has prior history of Echocardiogram examinations, most                 recent 11/26/2018. End stage renal disease. HIV.,                 Signs/Symptoms:Shortness of Breath and Chest Pain; Risk                 Factors:Hypertension and Diabetes.  Sonographer:    Johny Chess RDCS Referring Phys: [place]  1. Left ventricular ejection fraction, by estimation, is 60 to 65%. The left ventricle has normal function. The left ventricle has no regional wall motion abnormalities. There is moderate left ventricular hypertrophy. Left ventricular diastolic parameters are indeterminate.  2. Right ventricular systolic function is normal. The right ventricular size is normal.  3. Left atrial size was mildly dilated.  4. The pericardial effusion is lateral to the left ventricle.  5. The mitral valve is degenerative. Trivial mitral valve regurgitation. No evidence of mitral stenosis. Severe mitral annular calcification.  6. The aortic valve is calcified. Aortic valve regurgitation is trivial. Moderate sclerosis with no stenosis.  7. The inferior vena cava is normal in size with greater than 50% respiratory variability, suggesting right atrial pressure of 3 mmHg. FINDINGS  Left Ventricle: Left ventricular ejection fraction, by estimation, is 60 to 65%. The left ventricle has normal function. The left ventricle has no regional wall motion abnormalities. The left ventricular internal cavity size was normal in size. There is  moderate left ventricular hypertrophy. Left ventricular diastolic parameters are indeterminate. Right Ventricle: The right ventricular size is normal. No increase in right ventricular wall thickness. Right ventricular systolic function is normal. Left Atrium: Left atrial size was mildly dilated.  Right Atrium: Right atrial size was normal in size. Pericardium: Trivial pericardial effusion is present. The pericardial effusion is lateral to the left ventricle. Mitral Valve: The mitral valve is degenerative in appearance. There is moderate thickening of the mitral valve leaflet(s). There is mild calcification of the mitral valve leaflet(s). Severe mitral annular calcification. Trivial mitral valve regurgitation. No evidence of mitral valve stenosis. Tricuspid Valve: The tricuspid valve is normal in structure. Tricuspid valve regurgitation is trivial. No evidence of tricuspid stenosis. Aortic Valve: The aortic valve is calcified. Aortic valve regurgitation is trivial. Moderate sclerosis with no stenosis. Pulmonic Valve: The pulmonic valve was normal in structure. Pulmonic valve regurgitation is trivial. No evidence of pulmonic stenosis. Aorta: The aortic root is normal in size and structure. Venous: The inferior vena cava is normal in size with greater than 50% respiratory variability, suggesting right atrial pressure of 3 mmHg. IAS/Shunts: No atrial level shunt detected by color flow Doppler.  LEFT VENTRICLE PLAX 2D LVIDd:         4.60 cm   Diastology LVIDs:         3.20 cm   LV e' medial:    4.35 cm/s LV PW:         1.20 cm   LV E/e' medial:  22.6 LV IVS:        1.00 cm   LV e' lateral:   4.90 cm/s LVOT diam:  1.90 cm   LV E/e' lateral: 20.0 LV SV:         64 LV SV Index:   36 LVOT Area:     2.84 cm  RIGHT VENTRICLE             IVC RV S prime:     16.20 cm/s  IVC diam: 1.70 cm TAPSE (M-mode): 2.2 cm LEFT ATRIUM             Index        RIGHT ATRIUM          Index LA diam:        3.70 cm 2.10 cm/m   RA Area:     9.62 cm LA Vol (A2C):   79.7 ml 45.18 ml/m  RA Volume:   17.70 ml 10.03 ml/m LA Vol (A4C):   52.4 ml 29.70 ml/m LA Biplane Vol: 65.3 ml 37.01 ml/m  AORTIC VALVE LVOT Vmax:   122.00 cm/s LVOT Vmean:  81.900 cm/s LVOT VTI:    0.227 m  AORTA Ao Root diam: 2.60 cm Ao Asc diam:  2.60 cm MITRAL  VALVE MV Area (PHT): 3.53 cm     SHUNTS MV Decel Time: 215 msec     Systemic VTI:  0.23 m MV E velocity: 98.10 cm/s   Systemic Diam: 1.90 cm MV A velocity: 128.00 cm/s MV E/A ratio:  0.77 Jenkins Rouge MD Electronically signed by Jenkins Rouge MD Signature Date/Time: 05/21/2021/9:59:54 AM    Final     Cardiac Studies   Echo: 11/2018 IMPRESSIONS   1. The left ventricle has normal systolic function with an ejection  fraction of 60-65%. The cavity size was normal. There is mild concentric  left ventricular hypertrophy. Left ventricular diastolic Doppler  parameters are consistent with  pseudonormalization. Elevated left ventricular end-diastolic pressure No  evidence of left ventricular regional wall motion abnormalities.   2. The right ventricle has normal systolic function. The cavity was  normal. There is no increase in right ventricular wall thickness.   3. Left atrial size was mildly dilated.   4. The pericardial effusion is localized near the right atrium.   5. Trivial pericardial effusion is present.   6. The aortic valve is tricuspid. Mild sclerosis of the aortic valve.  Aortic valve regurgitation is trivial by color flow Doppler.   FINDINGS   Left Ventricle: The left ventricle has normal systolic function, with an  ejection fraction of 60-65%. The cavity size was normal. There is mild  concentric left ventricular hypertrophy. Left ventricular diastolic  Doppler parameters are consistent with  pseudonormalization. Elevated left ventricular end-diastolic pressure No  evidence of left ventricular regional wall motion abnormalities..  Right Ventricle: The right ventricle has normal systolic function. The  cavity was normal. There is no increase in right ventricular wall  thickness.  Left Atrium: Left atrial size was mildly dilated.  Right Atrium: Right atrial size was normal in size. Right atrial pressure  is estimated at 3 mmHg.  Interatrial Septum: No atrial level shunt detected by  color flow Doppler.  Pericardium: Trivial pericardial effusion is present. The pericardial  effusion is localized near the right atrium.  Mitral Valve: The mitral valve is normal in structure. Mitral valve  regurgitation is trivial by color flow Doppler.  Tricuspid Valve: The tricuspid valve is normal in structure. Tricuspid  valve regurgitation is trivial by color flow Doppler.  Aortic Valve: The aortic valve is tricuspid Mild sclerosis of the aortic  valve. Aortic  valve regurgitation is trivial by color flow Doppler.  Pulmonic Valve: The pulmonic valve was normal in structure. Pulmonic valve  regurgitation is not visualized by color flow Doppler.  Venous: The inferior vena cava is normal in size with greater than 50%  respiratory variability.   Patient Profile     45 y.o. female presented with elevated troponin and chest pain, status post PCI to the LAD.   Assessment & Plan    CAD - s/p PCI with DES to the LAD. Plan for 12 months DAPT, continue Aspirin and Plavix. Continue statin and beta blocker. Please lipid profile - if LDL > 50 add zetia 10 mg daily.  Hypertension  - blood pressure not well controlled.on lopressor 25 mg BID and Hydralazine 75 mg TID. If bp is still elevated post HD it may be beneficial to transition for lopressor to Coreg.   ESRD on HD    DM - continue with sliding scale insulin per primary team.   PAD - s/p Left AKA, Right BKA with wound vac. Continue on antiplatelets with Aspirin/Plavix   HIV- antiretroviral therapy per primary team.  [facility] will sign off.   Medication Recommendations:  Plavix 75 mg daily, Aspirin 81 mg daily ( DAPT for 12 months), lipitor 40 mg daily, Hydralazine 75 mg TID. If blood pressure remain elevated post HD switch to coreg 12.5 mg BID. Other recommendations (labs, testing, etc):  Get lipid profile if LDL > 50 add Zetia  Follow up as an outpatient:  with Dr. Berniece Salines cardiology  For questions or updates, please contact  [place] Please consult [email or web address] for contact info under Cardiology/STEMI.      Signed, Alexianna Nachreiner, DO  05/22/2021, 8:06 AM

## 2021-05-22 NOTE — Discharge Summary (Addendum)
Name: Kathy Frank MRN: [id number] DOB: 11-03-1975 45 y.o. PCP: [name], MD  Date of Admission: 05/20/2021  6:36 AM Date of Discharge: 05/23/2021 Attending Physician: Angelica Pou, MD  Discharge Diagnosis: 1.  NSTEMI 2.  Hypertensive emergency  3.  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  Anion gap metabolic acidosis 5.  Hyperkalemia without EKG changes 6.  Type 2 diabetes 7.  Peripheral arterial disease status post left AKA in 2020 and right BKA in [id number] complicated by postop healing difficulties, s/p recent revision 8.  Depression 9.  GERD 10.  Anemia of ESRD 11. Migraine  Discharge Medications: Allergies as of 05/23/2021   No Known Allergies      Medication List     STOP taking these medications    methocarbamol 500 MG tablet Commonly known as: Robaxin       TAKE these medications    Accu-Chek Softclix Lancets lancets Use as instructed   amLODipine 10 MG tablet Commonly known as: NORVASC Take 1 tablet (10 mg total) by mouth daily.   aspirin EC 81 MG tablet Take 1 tablet (81 mg total) by mouth daily. Swallow whole.   atorvastatin 80 MG tablet Commonly known as: LIPITOR Take 1 tablet (80 mg total) by mouth daily. Start taking on: May 24, 2021 What changed:  medication strength how much to take   Biktarvy 50-200-25 MG Tabs tablet Generic drug: bictegravir-emtricitabine-tenofovir AF TAKE 1 TABLET BY MOUTH DAILY.   calcitRIOL 0.25 MCG capsule Commonly known as: ROCALTROL Take 1 capsule (0.25 mcg total) by mouth Every Tuesday,Thursday,and Saturday with dialysis.   calcium acetate 667 MG capsule Commonly known as: PHOSLO Take 3 capsules (2,001 mg total) by mouth 3 (three) times daily with meals.   clopidogrel 75 MG tablet Commonly known as: PLAVIX Take 1 tablet (75 mg total) by mouth daily.   cyclobenzaprine 5 MG tablet Commonly known as: FLEXERIL Take 1 tablet (5 mg total) by mouth 2 (two) times daily as needed for muscle  spasms.   gabapentin 100 MG capsule Commonly known as: NEURONTIN Take 1-3 capsules (100-300 mg total) by mouth See admin instructions. Take 100 mg on Monday, Wednesday, Friday and Sunday What changed:  how much to take additional instructions   glucose blood test strip Use as instructed   hydrALAZINE 25 MG tablet Commonly known as: APRESOLINE Take 3 tablets (75 mg total) by mouth 3 (three) times daily. What changed:  medication strength how much to take additional instructions   Lantus SoloStar 100 UNIT/ML Solostar Pen Generic drug: insulin glargine Inject 8 Units into the skin at bedtime. What changed: how much to take   liraglutide 18 MG/3ML Sopn Commonly known as: VICTOZA Inject 0.6 mg into the skin daily.   metoprolol tartrate 25 MG tablet Commonly known as: LOPRESSOR Take 1 tablet (25 mg total) by mouth 2 (two) times daily.   multivitamin Tabs tablet Take 1 tablet by mouth daily.   pantoprazole 40 MG tablet Commonly known as: PROTONIX Take 1 tablet (40 mg total) by mouth daily.   Pentips 32G X 4 MM Misc Generic drug: Insulin Pen Needle Use as directed   polyethylene glycol 17 g packet Commonly known as: MIRALAX / GLYCOLAX Take 17 g by mouth 2 (two) times daily. What changed:  when to take this reasons to take this   ProAir HFA 108 (90 Base) MCG/ACT inhaler Generic drug: albuterol Inhale 2 puffs into the lungs every 4 (four) hours as needed for wheezing or shortness of breath.  sertraline 50 MG tablet Commonly known as: Zoloft Take 1 tablet (50 mg total) by mouth at bedtime.               Discharge Care Instructions  (From admission, onward)           Start     Ordered   05/23/21 0000  Discharge wound care:       Comments: Clean right stump wound with saline, pat dry.  Apply hydrogel to the open wounds along the incision.  Cover with dry dressing, cover with abdominal pad secured with tape.  Change daily.   05/23/21 1522             Disposition and follow-up:   [name] was discharged from [facility] in Stable condition.  At the hospital follow up visit please address:  1.  NSTEMI She will need to continue 12 months(05/22/2021) of dual antiplatelet therapy including aspirin and Plavix. Follow-up with cardiology on June 06, 2021 with Sande Rives, [place].  LDL greater than 50, patient was started on Zetia and increased atorvastatin to 80 mg at discharge per cardiologist instructions.   2. Hypertension Discharge medications include hydralazine 75 mg 3 times daily on non-dialysis days (increased from 50 mg tid), amlodipine 10 mg, and metoprolol tartrate 25 mg twice daily. May consider switching her Lopressor to carvedilol 12.5 mg twice daily if she continues to have elevated blood pressure at follow-up.  3.  Type 2 diabetes mellitus A1c at recent hospitalization in September at 7.1, this could be falsely low due to patient being on HD.  At recent office visit in September, patient was restarted on Victoza at 0.6 mg and Lantus 10 units nightly continued. If patient is tolerating victoza, may consider increasing dose.  4. Migraine versus tension headache-patient presented due to R sided throbbing headache, shortness of breath and chest pain.  At first her headache was thought to be due to hypertensive urgency.  However patient continued to have headache despite improved blood pressure control.  She was given migraine cocktail including Decadron 10 mg, Reglan 0.33 mg, and Benadryl 50 mg.  Migraine cocktail medications helped briefly, but patient states that headache returned.  Her paracervical spine and trapezius muscles are very tense and she states that her headache improved with gabapentin and flexeril.  Unclear if headache due to tension headache vs migraine vs mixed.  Tried flexeril and gabapentin again and this did not help with her headache.  She has a history of CVA and is not a candidate for  triptans.  She would benefit from additional work-up for headaches, she states that she has them two times weekly.  She frequently takes [facility] powders for these, which are contraindicated in the setting of ESRD.  5.  Labs / imaging needed at time of follow-up: BMP, CBC  6.  Pending labs/ test needing follow-up:none  Follow-up Appointments:  Follow-up Information     Darreld Mclean, PA-C Follow up.   Specialties: Physician Assistant, Cardiology Why: Follow-up with Cardiology on 06/06/2021 at 3:15pm. Please arrive 15 minutes early for check-in. If this date/time does not work, please call our office to reschedule. Contact information: [address] [place] [address] [phone]                 Hospital Course by problem list: 1.  NSTEMI Patient presented due to chest pain, shortness of breath, and headache.  Troponins were found to be elevated initially at 610  with troponin maximum of 967.  Troponins decreased to 805 on recheck.  EKG did not show signs of ischemic changes, but QT prolonged at 510 initially.  Cardiology consulted and evaluated patient.  A cardiac cath 05/21/2021 showed 75% blockage of LAD and stent was placed. She will need to continue 12 months(05/22/2021) of dual antiplatelet therapy including aspirin and Plavix.  We will continue Atorvastatin 80 mg.   Lipid profile with LDL> 50 added Zetia 10 mg daily.  Follow-up with cardiology  June 06, 2021 with Sande Rives, [place] and follow-up with the internal medicine clinic on October 18 at 3:15 PM.   2.  Hypertensive emergency (NSTEMI) with associated headache  Blood pressure on arrival at 207/98.  Home medications included hydralazine 50 mg 3 times daily and amlodipine 10 mg.  Her presenting symptoms included headache that woke her up from her sleep, chest pain and shortness of breath.  Head CT negative for acute bleed.  Emergency room physician initially noted right facial droop but it was not  appreciated on admission exam or repeat examinations during hospitalization. Discharge blood pressure medications include hydralazine 75 mg 3 times daily, amlodipine 10 mg, and metoprolol tartrate 25 mg twice daily.  Patient is chronically hypertensive though has a history of lower pressures during/after dialysis.  May consider switching her Lopressor to carvedilol 12.5 mg twice daily if she continues to have elevated blood pressure at follow-up per cardiology recommendations.  3.  ESRD Patient receives dialysis Tuesday, Thursday, and Saturday at [facility] and was continued on same schedule in the hospital.  Dialysis via left upper extremity aVF.   Notified renal navigator that patient discharging and patient will continue outpatient hemodialysis at [facility] on Saturday.    4.  Anion gap metabolic acidosis Anion gap elevated at 25.  Resolved  5.  Hyperkalemia without EKG changes Patient was taken for hemodialysis the evening of admission.   6.  Type 2 diabetes All medications include Lantus 10 units nightly and Victoza 0.6 mg.  Victoza was recently added at an internal medicine clinic office visit in September.  She will follow-up next week and at that time they may consider increasing GLP-1 dosage.  7.  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  She still has a wound VAC to her right extremity.  Patient discharged on Plavix and aspirin.  She will continue the aspirin for 1 year for her PCI and then continue Plavix due to PAD.  8.  Depression Zoloft continued while inpatient and continued at discharge  9.  GERD Protonix was continued at discharge  10.  Anemia of ESRD Hemoglobin of 9.9.  On erythropoietin and iron load as outpatient  11.  HIV Continue Biktarvy at discharge.  12. Migraine Patient presented to hospital  due to waking up with headache.  She was found to have hypertensive emergency with NSTEMI.  Headache was originally thought to be due to to elevated blood pressure, however headache continued despite improved blood pressure control.  She was given migraine cocktail with Decadron, Reglan, and Benadryl.  Will benefit from continued work-up for possible migraine. patient presented due to headache, shortness of breath and chest pain.  At first her headache was thought to be due to hypertensive urgency.  However patient continued to have headache despite improved blood pressure control.  She was given migraine  cocktail including Decadron 10 mg, Reglan 0.33 mg, and Benadryl 50 mg.  Migraine cocktail medications helped briefly, but patient states that headache returned.  Her muscles are very tense and she states that her headache improved with gabapentin and flexeril.  Unclear if migraine due to tension headache vs migraine.  She has a history of CVA and is not a candidate for triptans.  She would benefit from additional work-up for headaches, she states that she has them two times weekly.  She frequently takes [facility] powders for these, which are contraindicated in the setting of ESRD.  Discharge Exam:   BP 107/71 (BP Location: Right Wrist)   Pulse 77   Temp 98.1 F (36.7 C) (Oral)   Resp 17   Ht '5\' 5"'$  (1.651 m)   Wt 67.2 kg   SpO2 98%   BMI 24.65 kg/m  Discharge exam:  General: well-developed, well-nourished HENT: NCAT Eyes: no scleral icterus, conjunctiva clear CV: no murmurs, regular rate and rhythm Pulm: CTAB, normal pulmonary effort GI: no tenderness, bowel sounds present MSK: BKA of right lower extremity with gauze dressing in place, well- healed AKA of left lower extremity Skin: warm and dry Psych: normal mood and affect  Pertinent Labs, Studies, and Procedures:  CBC Latest Ref Rng & Units 05/22/2021 05/20/2021 05/20/2021  WBC 4.0 - 10.5 K/uL 5.6 - 7.7  Hemoglobin 12.0 - 15.0 g/dL 10.7(L)  11.6(L) 9.9(L)  Hematocrit 36.0 - 46.0 % 34.3(L) 34.0(L) 33.1(L)  Platelets 150 - 400 K/uL 420(H) - 385    BMP Latest Ref Rng & Units 05/22/2021 05/21/2021 05/20/2021  Glucose 70 - 99 mg/dL 115(H) 113(H) -  BUN 6 - 20 mg/dL 41(H) 32(H) -  Creatinine 0.44 - 1.00 mg/dL 7.22(H) 5.55(H) -  BUN/Creat Ratio 6 - 22 (calc) - - -  Sodium 135 - 145 mmol/L 131(L) 133(L) 137  Potassium 3.5 - 5.1 mmol/L 4.5 4.7 5.2(H)  Chloride 98 - 111 mmol/L 93(L) 94(L) -  CO2 22 - 32 mmol/L 21(L) 24 -  Calcium 8.9 - 10.3 mg/dL 9.0 8.7(L) -       DG Chest 2 View  Result Date: 05/20/2021 CLINICAL DATA:  Shortness of breath. EXAM: CHEST - 2 VIEW COMPARISON:  01/28/2021 FINDINGS: The heart is within normal limits in size. There is mild tortuosity and age advanced calcification involving the thoracic aorta. The pulmonary hila appear normal. The lungs are clear. No pleural effusions or pulmonary lesions. The bony thorax is intact. IMPRESSION: No acute cardiopulmonary findings. Electronically Signed   By: Marijo Sanes M.D.   On: 05/20/2021 07:29   CT Head Wo Contrast  Result Date: 05/20/2021 CLINICAL DATA:  Headache EXAM: CT HEAD WITHOUT CONTRAST TECHNIQUE: Contiguous axial images were obtained from the base of the skull through the vertex without intravenous contrast. COMPARISON:  06/06/2019 FINDINGS: Brain: Again noted is area of low attenuation within the right anterior centrum semiovale compatible with old infarct, unchanged since prior study. No acute intracranial abnormality. Specifically, no hemorrhage, hydrocephalus, mass lesion, acute infarction, or significant intracranial injury. Vascular: No hyperdense vessel or unexpected calcification. Skull: No acute calvarial abnormality. Sinuses/Orbits: No acute findings Other: None IMPRESSION: No acute intracranial abnormality. Electronically Signed   By: Rolm Baptise M.D.   On: 05/20/2021 12:12   CARDIAC CATHETERIZATION  Result Date: 05/21/2021   Mid LAD lesion is 75%  stenosed.   A drug-eluting stent was successfully placed using a [place] [id number].  Deployed to 3.0 mm   Post intervention, there is  a 0% residual stenosis.   ---------------------------------   Prox RCA lesion is 35% stenosed.  Dist RCA lesion is 40% stenosed.   ---------------------------------   LV end diastolic pressure is normal.   There is no aortic valve stenosis. SUMMARY Severe single-vessel CAD with 70% mid LAD stenosis and moderate 30 to 40% proximal and distal RCA stenosis. Successful DES PCI of mid LAD using Onyx Frontier DES 2.7 by milligray 15 mm deployed to 3.0 mm.  Reduced lesion to 0% with TIMI-3 flow pre and post Normal LVEDP with normal EF by Echocardiogram. Recommendations: Transfer to 6 E. postprocedural unit for ongoing post cath/PCI care with sheath removal per protocol She is already on aspirin and Plavix, continue long-term therapy (uninterrupted for 1 year) Continue to titrate guideline directed medical management for CAD-have increased atorvastatin to 80 mg, Glenetta Hew, MD  ECHOCARDIOGRAM COMPLETE  Result Date: 05/21/2021    ECHOCARDIOGRAM REPORT   Patient Name:   AMORIAH STARNS Date of Exam: 05/21/2021 Medical Rec #:  [id number]       Height:       65.0 in Accession #:    [id number]      Weight:       152.8 lb Date of Birth:  1976-04-24        BSA:          1.764 m Patient Age:    79 years        BP:           139/68 mmHg Patient Gender: F               HR:           79 bpm. Exam Location:  Inpatient Procedure: 2D Echo Indications:    chest pain  History:        Patient has prior history of Echocardiogram examinations, most                 recent 11/26/2018. End stage renal disease. HIV.,                 Signs/Symptoms:Shortness of Breath and Chest Pain; Risk                 Factors:Hypertension and Diabetes.  Sonographer:    Johny Chess RDCS Referring Phys: [place]  1. Left ventricular ejection fraction, by estimation, is 60 to 65%. The  left ventricle has normal function. The left ventricle has no regional wall motion abnormalities. There is moderate left ventricular hypertrophy. Left ventricular diastolic parameters are indeterminate.  2. Right ventricular systolic function is normal. The right ventricular size is normal.  3. Left atrial size was mildly dilated.  4. The pericardial effusion is lateral to the left ventricle.  5. The mitral valve is degenerative. Trivial mitral valve regurgitation. No evidence of mitral stenosis. Severe mitral annular calcification.  6. The aortic valve is calcified. Aortic valve regurgitation is trivial. Moderate sclerosis with no stenosis.  7. The inferior vena cava is normal in size with greater than 50% respiratory variability, suggesting right atrial pressure of 3 mmHg. FINDINGS  Left Ventricle: Left ventricular ejection fraction, by estimation, is 60 to 65%. The left ventricle has normal function. The left ventricle has no regional wall motion abnormalities. The left ventricular internal cavity size was normal in size. There is  moderate left ventricular hypertrophy. Left ventricular diastolic parameters are indeterminate. Right Ventricle: The right ventricular size is normal. No increase in right ventricular wall  thickness. Right ventricular systolic function is normal. Left Atrium: Left atrial size was mildly dilated. Right Atrium: Right atrial size was normal in size. Pericardium: Trivial pericardial effusion is present. The pericardial effusion is lateral to the left ventricle. Mitral Valve: The mitral valve is degenerative in appearance. There is moderate thickening of the mitral valve leaflet(s). There is mild calcification of the mitral valve leaflet(s). Severe mitral annular calcification. Trivial mitral valve regurgitation. No evidence of mitral valve stenosis. Tricuspid Valve: The tricuspid valve is normal in structure. Tricuspid valve regurgitation is trivial. No evidence of tricuspid stenosis.  Aortic Valve: The aortic valve is calcified. Aortic valve regurgitation is trivial. Moderate sclerosis with no stenosis. Pulmonic Valve: The pulmonic valve was normal in structure. Pulmonic valve regurgitation is trivial. No evidence of pulmonic stenosis. Aorta: The aortic root is normal in size and structure. Venous: The inferior vena cava is normal in size with greater than 50% respiratory variability, suggesting right atrial pressure of 3 mmHg. IAS/Shunts: No atrial level shunt detected by color flow Doppler.  LEFT VENTRICLE PLAX 2D LVIDd:         4.60 cm   Diastology LVIDs:         3.20 cm   LV e' medial:    4.35 cm/s LV PW:         1.20 cm   LV E/e' medial:  22.6 LV IVS:        1.00 cm   LV e' lateral:   4.90 cm/s LVOT diam:     1.90 cm   LV E/e' lateral: 20.0 LV SV:         64 LV SV Index:   36 LVOT Area:     2.84 cm  RIGHT VENTRICLE             IVC RV S prime:     16.20 cm/s  IVC diam: 1.70 cm TAPSE (M-mode): 2.2 cm LEFT ATRIUM             Index        RIGHT ATRIUM          Index LA diam:        3.70 cm 2.10 cm/m   RA Area:     9.62 cm LA Vol (A2C):   79.7 ml 45.18 ml/m  RA Volume:   17.70 ml 10.03 ml/m LA Vol (A4C):   52.4 ml 29.70 ml/m LA Biplane Vol: 65.3 ml 37.01 ml/m  AORTIC VALVE LVOT Vmax:   122.00 cm/s LVOT Vmean:  81.900 cm/s LVOT VTI:    0.227 m  AORTA Ao Root diam: 2.60 cm Ao Asc diam:  2.60 cm MITRAL VALVE MV Area (PHT): 3.53 cm     SHUNTS MV Decel Time: 215 msec     Systemic VTI:  0.23 m MV E velocity: 98.10 cm/s   Systemic Diam: 1.90 cm MV A velocity: 128.00 cm/s MV E/A ratio:  0.77 Jenkins Rouge MD Electronically signed by Jenkins Rouge MD Signature Date/Time: 05/21/2021/9:59:54 AM    Final      Discharge Instructions: Discharge Instructions     Amb Referral to Cardiac Rehabilitation   Complete by: As directed    Diagnosis:  Coronary Stents NSTEMI     After initial evaluation and assessments completed: Virtual Based Care may be provided alone or in conjunction with Phase 2  Cardiac Rehab based on patient barriers.: Yes   Diet - low sodium heart healthy   Complete by: As directed    Discharge  instructions   Complete by: As directed    Ms. [place], You presented to the hospital due to chest pain, headache, and shortness of breath.  We found that you had a heart attack.  Your heart doctors did a procedure to look at the blood vessels in your heart and found that one of them had a plaque and significant narrowing of the vessel.  They put a tube called a stent in this blockage to improve blood flow to your heart.  You will follow-up with cardiology with Sande Rives, [place] on October 28th at 3:10 PM.  It is important that you take Plavix and aspirin for the next year to help prevent the tube in your heart getting plaque on it.  After a year you can stop the aspirin and continue Plavix.  Is important that we address the risk factors that increase your chances of having another heart attack.  This includes cholesterol.  Please take atorvastatin 80 mg and Zetia 10 mg to help with your cholesterol.  Another risk factor is having high blood sugars.  Follow-up at the internal medicine clinic on October 18 at 3:15 PM to continue working on the management of your diabetes.  The last risk factor to work on is your blood pressure.  On your nondialysis days please continue hydralazine 75 mg 3 times daily metoprolol 25 mg daily.  You have also had continued headaches while in the hospital.  We thought this can be due to your blood pressure, but these were not improved with controlled blood pressure.  Headache can also be due to tight muscles.  I sent in a short course of Flexeril to help with this.  The other causes you could have underlying migraines.  Please bring up continued headaches if they have occurred at your office witness it at the internal medicine clinic on October 18.   Thank you for letting us take part of your care, Christiana Fuchs, DO   Discharge wound care:   Complete by: As  directed    Clean right stump wound with saline, pat dry.  Apply hydrogel to the open wounds along the incision.  Cover with dry dressing, cover with abdominal pad secured with tape.  Change daily.   Increase activity slowly   Complete by: As directed        Signed: Christiana Fuchs, DO 05/23/2021, 3:23 PM   Pager: [phone]

## 2021-05-22 NOTE — Progress Notes (Addendum)
[facility] Progress Note   Subjective:   Seen in HD unit --UF goal 3L. BP 112/61 on HD. Continued HA not better/worse on HD.  LHC yesterday with PCI. No chest pain/sob.   Objective Vitals:   05/22/21 0827 05/22/21 0838 05/22/21 0900 05/22/21 0930  BP:  (!) 182/79 (!) 134/105 103/82  Pulse: 74 72 74 71  Resp: 20 10    Temp:      TempSrc:      SpO2:      Weight: 67.8 kg     Height:         Additional Objective Labs: Basic Metabolic Panel: Recent Labs  Lab 05/20/21 0700 05/20/21 1214 05/20/21 1243 05/21/21 0849 05/22/21 0317  NA 140  --  137 133* 131*  K 5.8*  --  5.2* 4.7 4.5  CL 96*  --   --  94* 93*  CO2 19*  --   --  24 21*  GLUCOSE 113*  --   --  113* 115*  BUN 68*  --   --  32* 41*  CREATININE 8.85* 9.25*  --  5.55* 7.22*  CALCIUM 8.2*  --   --  8.7* 9.0    CBC: Recent Labs  Lab 05/20/21 0700 05/20/21 1243 05/22/21 0317  WBC 7.7  --  5.6  NEUTROABS 4.6  --   --   HGB 9.9* 11.6* 10.7*  HCT 33.1* 34.0* 34.3*  MCV 98.5  --  95.0  PLT 385  --  420*    Blood Culture    Component Value Date/Time   SDES BLOOD RIGHT ANTECUBITAL 01/28/2021 1812   SPECREQUEST  01/28/2021 1812    BOTTLES DRAWN AEROBIC AND ANAEROBIC Blood Culture adequate volume   CULT  01/28/2021 1812    NO GROWTH 5 DAYS Performed at [facility], [place] [address], [place], [place] [address]    REPTSTATUS 02/02/2021 FINAL 01/28/2021 1812     Physical Exam General: chronically ill appearing, nad  Heart: RRR No rub  Lungs: Clear bilaterally  Abdomen: soft non-tender  Extremities: bilateral BKAs No edema  Dialysis Access: LUE AVF +bruit   Medications:  sodium chloride     sodium chloride     sodium chloride      amLODipine  10 mg Oral Daily   aspirin EC  81 mg Oral Daily   atorvastatin  80 mg Oral Daily   bictegravir-emtricitabine-tenofovir AF  1 tablet Oral Daily   calcitRIOL  0.25 mcg Oral Q T,Th,Sa-HD   calcium acetate  2,001 mg Oral TID WC    Chlorhexidine Gluconate Cloth  6 each Topical Q0600   clopidogrel  75 mg Oral Daily   heparin injection (subcutaneous)  5,000 Units Subcutaneous Q8H   hydrALAZINE  75 mg Oral TID   insulin aspart  0-6 Units Subcutaneous TID WC   insulin glargine-yfgn  8 Units Subcutaneous QHS   metoprolol tartrate  25 mg Oral BID   mupirocin ointment  1 application Nasal BID   pantoprazole  40 mg Oral Daily   sertraline  50 mg Oral QHS   sodium chloride flush  3 mL Intravenous Q12H   sodium chloride flush  3 mL Intravenous Q12H   sodium zirconium cyclosilicate  10 g Oral Daily    Dialysis Orders:  [facility] TTS 4h  400/A1.5x EDW  67.5kg 2K/2Ca  AVF Heparin 3000 U  -Venofer 100 mg  x 10 (until 10/15)  -Mircera 150 q 2 weeks  (last 10/6)  -  Calcitriol 0.25 TIW    Assessment/Plan: ESRD -  HD TTS. Continue on schedule. HD today 10/13  Elevated troponin -  per cardiology. S/p LHC 10/12 with  PCI/DES to LAD. Aspirin/Plavix x 12 months  Headache - Per primary. No acute findings on head CT.  Hypertension/volume  - BP improved. Continue home HTN meds. Post HD wt 69.3kg. Continue UF to dry weight.  Anemia  - Hgb 9.9 . On ESA/Fe load as outpatient.  Metabolic bone disease -  Continue home binders.  Nutrition - Renal diet with fluid restriction.   Lynnda Child PA-C [facility] 05/22/2021,9:53 AM   I have personally seen and examined this patient and agree with the assessment/plan as outlined below by Larina Earthly PA-C. I was present at this dialysis session. I have reviewed the session itself and made appropriate changes. UFG 3500. BP stable, attempting to get her down to her EDW. HA's unchanged.  Mycheal Veldhuizen,MD 05/22/2021 11:18 AM

## 2021-05-22 NOTE — Progress Notes (Signed)
Pt receives out-pt HD at [facility] on TTS. Pt arrives at 6:40 for 7:00 chair time. Will assist as needed   Melven Sartorius Renal Navigator [phone]

## 2021-05-22 NOTE — Plan of Care (Signed)
  Problem: Education: Goal: Knowledge of General Education information will improve Description: Including pain rating scale, medication(s)/side effects and non-pharmacologic comfort measures Outcome: Progressing   Problem: Clinical Measurements: Goal: Ability to maintain clinical measurements within normal limits will improve Outcome: Progressing   Problem: Clinical Measurements: Goal: Diagnostic test results will improve Outcome: Progressing   Problem: Clinical Measurements: Goal: Cardiovascular complication will be avoided Outcome: Progressing   Problem: Pain Managment: Goal: General experience of comfort will improve Outcome: Progressing   Problem: Safety: Goal: Ability to remain free from injury will improve Outcome: Progressing   Problem: Cardiac: Goal: Ability to achieve and maintain adequate cardiopulmonary perfusion will improve Outcome: Progressing   Problem: Cardiac: Goal: Vascular access site(s) Level 0-1 will be maintained Outcome: Progressing

## 2021-05-23 DIAGNOSIS — I214 Non-ST elevation (NSTEMI) myocardial infarction: Secondary | ICD-10-CM | POA: Diagnosis not present

## 2021-05-23 DIAGNOSIS — I160 Hypertensive urgency: Secondary | ICD-10-CM

## 2021-05-23 DIAGNOSIS — Z955 Presence of coronary angioplasty implant and graft: Secondary | ICD-10-CM

## 2021-05-23 LAB — GLUCOSE, CAPILLARY
Glucose-Capillary: 349 mg/dL — ABNORMAL HIGH (ref 70–99)
Glucose-Capillary: 409 mg/dL — ABNORMAL HIGH (ref 70–99)
Glucose-Capillary: 287 mg/dL — ABNORMAL HIGH (ref 70–99)
Glucose-Capillary: 217 mg/dL — ABNORMAL HIGH (ref 70–99)

## 2021-05-23 MED ORDER — CYCLOBENZAPRINE HCL 5 MG PO TABS: 5.0000 mg | ORAL_TABLET | Freq: Two times a day (BID) | ORAL | 0 refills | Status: DC | PRN

## 2021-05-23 MED ORDER — HYDRALAZINE HCL 25 MG PO TABS: 75.0000 mg | ORAL_TABLET | Freq: Three times a day (TID) | ORAL | 11 refills | Status: DC

## 2021-05-23 MED ORDER — ATORVASTATIN CALCIUM 80 MG PO TABS
80.0000 mg | ORAL_TABLET | Freq: Every day | ORAL | 11 refills | Status: DC
Start: 2021-05-24 — End: 2022-10-16

## 2021-05-23 MED ORDER — FUROSEMIDE 10 MG/ML IJ SOLN
INTRAMUSCULAR | Status: AC
  Filled 2021-05-23: qty 2

## 2021-05-23 MED ORDER — SODIUM ZIRCONIUM CYCLOSILICATE 10 G PO PACK
10.0000 g | PACK | Freq: Every day | ORAL | Status: DC
  Administered 2021-05-23: 10 g via ORAL
  Filled 2021-05-23: qty 1

## 2021-05-23 MED ORDER — GABAPENTIN 100 MG PO CAPS
100.0000 mg | ORAL_CAPSULE | Freq: Once | ORAL | Status: AC
  Administered 2021-05-23: 100 mg via ORAL
  Filled 2021-05-23: qty 1

## 2021-05-23 MED ORDER — GABAPENTIN 100 MG PO CAPS: 100.0000 mg | ORAL_CAPSULE | ORAL | 2 refills | Status: DC

## 2021-05-23 MED ORDER — CYCLOBENZAPRINE HCL 10 MG PO TABS
5.0000 mg | ORAL_TABLET | Freq: Once | ORAL | Status: AC
  Administered 2021-05-23: 5 mg via ORAL
  Filled 2021-05-23: qty 1

## 2021-05-23 MED ORDER — METOPROLOL TARTRATE 25 MG PO TABS: 25.0000 mg | ORAL_TABLET | Freq: Two times a day (BID) | ORAL | 11 refills | Status: DC

## 2021-05-23 MED ORDER — KETOROLAC TROMETHAMINE 30 MG/ML IJ SOLN
30.0000 mg | Freq: Once | INTRAMUSCULAR | Status: AC
  Administered 2021-05-23: 30 mg via INTRAVENOUS
  Filled 2021-05-23: qty 1

## 2021-05-23 NOTE — Progress Notes (Signed)
Inpatient Diabetes Program Recommendations  AACE/ADA: New Consensus Statement on Inpatient Glycemic Control (2015)  Target Ranges:  Prepandial:   less than 140 mg/dL      Peak postprandial:   less than 180 mg/dL (1-2 hours)      Critically ill patients:  140 - 180 mg/dL   Lab Results  Component Value Date   GLUCAP 349 (H) 05/23/2021   HGBA1C 7.1 (H) 04/18/2021    Review of Glycemic Control  Diabetes history: DM 2 Outpatient Diabetes medications: Lantus 8 units, Victoza 0.6 mg Daily Current orders for Inpatient glycemic control:  Semglee 8 units Novolog 0-6 units tid  A1c 7.1% on 9/9  Inpatient Diabetes Program Recommendations:    Note decadron 6 mg x2 doses given yesterday glucose trends in mid 300 range  -  Increase Semglee to 12 units -  Increase Novolog 0-9 units tid + hs  Will need to titrate back down in 24-48 hours  Thanks,  Tama Headings RN, MSN, BC-ADM Inpatient Diabetes Coordinator Team Pager [phone] (8a-5p)

## 2021-05-23 NOTE — Progress Notes (Addendum)
Per provider, pt will be d/c later this afternoon.   Melven Sartorius Renal Navigator [phone]   Addendum at 4:34 pm: Spoke to Melissa at [facility] to advise clinic that pt will resume care tomorrow am.

## 2021-05-23 NOTE — Progress Notes (Signed)
Internal medicine paged. Blood sugar 409.

## 2021-05-23 NOTE — Progress Notes (Signed)
  Progress Note   Date: 05/23/2021  Patient Name: Kathy Frank        MRN#: [id number]   Hyperglycemia

## 2021-05-23 NOTE — TOC Initial Note (Signed)
Transition of Care [facility]) - Initial/Assessment Note    Patient Details  Name: Kathy Frank MRN: [id number] Date of Birth: 11-Nov-1975  [facility] of [facility]) CM/SW Contact:    Bethena Roys, RN Phone Number: 05/23/2021, 2:50 PM  Clinical Narrative:  Risk for readmission assessment completed. Patient states she is from home and has durable medical equipment wheelchair. Patient states she has transportation the Internal Medicine appointments. Patient states she gets medications appropriately. Case Manager will follow for home health needs as the patient progresses.             Expected Discharge Plan: [place] Barriers to Discharge: Continued Medical Work up   Patient Goals and CMS Choice Patient states their goals for this hospitalization and ongoing recovery are:: to return home once stable.      Expected Discharge Plan and Services Expected Discharge Plan: [place] In-house Referral: NA Discharge Planning Services: CM Consult   Living arrangements for the past 2 months: Single Family Home                   DME Agency: NA     Prior Living Arrangements/Services Living arrangements for the past 2 months: Single Family Home Lives with:: Self Patient language and need for interpreter reviewed:: Yes Do you feel safe going back to the place where you live?: Yes      Need for Family Participation in Patient Care: Yes (Comment) Care giver support system in place?: Yes (comment)   Criminal Activity/Legal Involvement Pertinent to Current Situation/Hospitalization: No - Comment as needed  Activities of Daily Living Home Assistive Devices/Equipment: Wheelchair ADL Screening (condition at time of admission) Patient's cognitive ability adequate to safely complete daily activities?: Yes Is the patient deaf or have difficulty hearing?: No Does the patient have difficulty seeing, even when wearing glasses/contacts?: No Does the  patient have difficulty concentrating, remembering, or making decisions?: No Patient able to express need for assistance with ADLs?: Yes Does the patient have difficulty dressing or bathing?: Yes Independently performs ADLs?: Yes (appropriate for developmental age) Does the patient have difficulty walking or climbing stairs?: Yes Weakness of Legs: None (Left AKA, Right BKA) Weakness of Arms/Hands: None  Permission Sought/Granted Permission sought to share information with : Facility Sport and exercise psychologist, Case Manager   Emotional Assessment Appearance:: Appears stated age Attitude/Demeanor/Rapport: Engaged Affect (typically observed): Appropriate Orientation: : Oriented to Situation, Oriented to Self, Oriented to Place, Oriented to  Time Alcohol / Substance Use: Not Applicable Psych Involvement: No (comment)  Admission diagnosis:  Shortness of breath [R06.02] Hypertensive urgency [I16.0] Patient Active Problem List   Diagnosis Date Noted   Coronary artery disease involving native coronary artery of native heart with unstable angina pectoris ([place])    Hypertensive urgency 05/20/2021   NSTEMI (non-ST elevated myocardial infarction) ([place]) 05/20/2021   BKA stump complication ([place]) [id number]   Right below-knee amputee ([place]) 02/08/2021   Peripheral arterial disease ([place]) 01/29/2021   Anxiety 05/28/2020   Headache, unspecified 05/02/2020   Patient's other noncompliance with medication regimen 12/28/2019   Major depressive disorder, single episode, unspecified 04/24/2019   Secondary hyperparathyroidism of renal origin ([place]) 04/24/2019   S/P AKA (above knee amputation), left ([place]) 12/16/2018   Anemia due to chronic kidney disease    Moderate protein-calorie malnutrition (HCC)    Adjustment disorder with mixed anxiety and depressed mood 08/15/2018   Wound dehiscence    ESRD (end stage renal disease) ([place])    Fecal  incontinence 02/04/2018   Healthcare maintenance 09/17/2017   Aortic  atherosclerosis ([place]) 09/30/2016   Human immunodeficiency virus (HIV) disease ([place]) 04/23/2016   Gastroparesis    Moderate nonproliferative diabetic retinopathy of both eyes ([place]) 11/21/2014   Esophageal reflux    Hypertension 11/16/2013   Depression 06/28/2006   Diabetes mellitus type 2 in obese ([place]) 06/28/1994   PCP:  [name], MD Pharmacy:   [place] ([place]), [place] - [address] [place] [address] Phone: [phone] Fax: [phone]   Readmission Risk Interventions Readmission Risk Prevention Plan 05/23/2021 04/22/2021 12/19/2018  Transportation Screening Complete Complete Complete  Medication Review (RN Care Manager) Complete Complete Referral to Pharmacy  PCP or Specialist appointment within 3-5 days of discharge Complete Complete Complete  PCP/Specialist Appt Not Complete comments - - -  [place] or [place] Complete Complete Complete  SW Recovery Care/Counseling Consult Complete Complete Complete  Palliative Care Screening Not Applicable Not Applicable Not [place] Not Applicable Not Applicable Not Applicable  Some recent data might be hidden

## 2021-05-23 NOTE — Progress Notes (Signed)
  Progress Note   Date: 05/23/2021  Patient Name: Kathy Frank        MRN#: [id number]  Clarification of diagnosis: hypertensive emergency (NSTEMI)

## 2021-05-23 NOTE — Progress Notes (Signed)
[facility] Progress Note   Subjective:  Seen in room. C/o chest heaviness but no specific CP or dyspnea. HD went fine yesterday, able to get her down a little below prior EDW (67.2kg). Still with headache.  Objective Vitals:   05/22/21 2000 05/23/21 0000 05/23/21 0500 05/23/21 0810  BP: (!) 145/54 (!) 156/76 (!) 156/73 (!) 186/83  Pulse: 80 81 78 84  Resp: 15  15   Temp: 98.2 F (36.8 C) 97.9 F (36.6 C) 98 F (36.7 C)   TempSrc: Oral Oral Oral   SpO2: 95% 96% 100%   Weight:   67.2 kg   Height:       Physical Exam General: Chronically ill appearing woman, NAD Heart: RRR; no murmur Lungs: CTAB; no rales Abdomen: aoft Extremities: B BKA, no stump edema Dialysis Access: LUE aneurysmal AVF + thrill  Additional Objective Labs: Basic Metabolic Panel: Recent Labs  Lab 05/20/21 0700 05/20/21 1214 05/20/21 1243 05/21/21 0849 05/22/21 0317  NA 140  --  137 133* 131*  K 5.8*  --  5.2* 4.7 4.5  CL 96*  --   --  94* 93*  CO2 19*  --   --  24 21*  GLUCOSE 113*  --   --  113* 115*  BUN 68*  --   --  32* 41*  CREATININE 8.85* 9.25*  --  5.55* 7.22*  CALCIUM 8.2*  --   --  8.7* 9.0   Liver Function Tests: Recent Labs  Lab 05/20/21 0700 05/22/21 0317  AST 15 10*  ALT 9 8  ALKPHOS 76 71  BILITOT 1.0 0.7  PROT 9.2* 8.0  ALBUMIN 3.3* 3.0*   CBC: Recent Labs  Lab 05/20/21 0700 05/20/21 1243 05/22/21 0317  WBC 7.7  --  5.6  NEUTROABS 4.6  --   --   HGB 9.9* 11.6* 10.7*  HCT 33.1* 34.0* 34.3*  MCV 98.5  --  95.0  PLT 385  --  420*   Studies/Results: CARDIAC CATHETERIZATION  Result Date: 05/21/2021   Mid LAD lesion is 75% stenosed.   A drug-eluting stent was successfully placed using a [place] [id number].  Deployed to 3.0 mm   Post intervention, there is a 0% residual stenosis.   ---------------------------------   Prox RCA lesion is 35% stenosed.  Dist RCA lesion is 40% stenosed.   ---------------------------------   LV end diastolic  pressure is normal.   There is no aortic valve stenosis. SUMMARY Severe single-vessel CAD with 70% mid LAD stenosis and moderate 30 to 40% proximal and distal RCA stenosis. Successful DES PCI of mid LAD using Onyx Frontier DES 2.7 by milligray 15 mm deployed to 3.0 mm.  Reduced lesion to 0% with TIMI-3 flow pre and post Normal LVEDP with normal EF by Echocardiogram. Recommendations: Transfer to 6 E. postprocedural unit for ongoing post cath/PCI care with sheath removal per protocol She is already on aspirin and Plavix, continue long-term therapy (uninterrupted for 1 year) Continue to titrate guideline directed medical management for CAD-have increased atorvastatin to 80 mg, Glenetta Hew, MD  ECHOCARDIOGRAM COMPLETE  Result Date: 05/21/2021    ECHOCARDIOGRAM REPORT   Patient Name:   HADESSAH HELBING Date of Exam: 05/21/2021 Medical Rec #:  [id number]       Height:       65.0 in Accession #:    [id number]      Weight:       152.8 lb Date of Birth:  [birth year]  BSA:          1.764 m Patient Age:    45 years        BP:           139/68 mmHg Patient Gender: F               HR:           79 bpm. Exam Location:  Inpatient Procedure: 2D Echo Indications:    chest pain  History:        Patient has prior history of Echocardiogram examinations, most                 recent 11/26/2018. End stage renal disease. HIV.,                 Signs/Symptoms:Shortness of Breath and Chest Pain; Risk                 Factors:Hypertension and Diabetes.  Sonographer:    Johny Chess RDCS Referring Phys: [place]  1. Left ventricular ejection fraction, by estimation, is 60 to 65%. The left ventricle has normal function. The left ventricle has no regional wall motion abnormalities. There is moderate left ventricular hypertrophy. Left ventricular diastolic parameters are indeterminate.  2. Right ventricular systolic function is normal. The right ventricular size is normal.  3. Left atrial size was mildly  dilated.  4. The pericardial effusion is lateral to the left ventricle.  5. The mitral valve is degenerative. Trivial mitral valve regurgitation. No evidence of mitral stenosis. Severe mitral annular calcification.  6. The aortic valve is calcified. Aortic valve regurgitation is trivial. Moderate sclerosis with no stenosis.  7. The inferior vena cava is normal in size with greater than 50% respiratory variability, suggesting right atrial pressure of 3 mmHg. FINDINGS  Left Ventricle: Left ventricular ejection fraction, by estimation, is 60 to 65%. The left ventricle has normal function. The left ventricle has no regional wall motion abnormalities. The left ventricular internal cavity size was normal in size. There is  moderate left ventricular hypertrophy. Left ventricular diastolic parameters are indeterminate. Right Ventricle: The right ventricular size is normal. No increase in right ventricular wall thickness. Right ventricular systolic function is normal. Left Atrium: Left atrial size was mildly dilated. Right Atrium: Right atrial size was normal in size. Pericardium: Trivial pericardial effusion is present. The pericardial effusion is lateral to the left ventricle. Mitral Valve: The mitral valve is degenerative in appearance. There is moderate thickening of the mitral valve leaflet(s). There is mild calcification of the mitral valve leaflet(s). Severe mitral annular calcification. Trivial mitral valve regurgitation. No evidence of mitral valve stenosis. Tricuspid Valve: The tricuspid valve is normal in structure. Tricuspid valve regurgitation is trivial. No evidence of tricuspid stenosis. Aortic Valve: The aortic valve is calcified. Aortic valve regurgitation is trivial. Moderate sclerosis with no stenosis. Pulmonic Valve: The pulmonic valve was normal in structure. Pulmonic valve regurgitation is trivial. No evidence of pulmonic stenosis. Aorta: The aortic root is normal in size and structure. Venous: The  inferior vena cava is normal in size with greater than 50% respiratory variability, suggesting right atrial pressure of 3 mmHg. IAS/Shunts: No atrial level shunt detected by color flow Doppler.  LEFT VENTRICLE PLAX 2D LVIDd:         4.60 cm   Diastology LVIDs:         3.20 cm   LV e' medial:    4.35 cm/s LV PW:  1.20 cm   LV E/e' medial:  22.6 LV IVS:        1.00 cm   LV e' lateral:   4.90 cm/s LVOT diam:     1.90 cm   LV E/e' lateral: 20.0 LV SV:         64 LV SV Index:   36 LVOT Area:     2.84 cm  RIGHT VENTRICLE             IVC RV S prime:     16.20 cm/s  IVC diam: 1.70 cm TAPSE (M-mode): 2.2 cm LEFT ATRIUM             Index        RIGHT ATRIUM          Index LA diam:        3.70 cm 2.10 cm/m   RA Area:     9.62 cm LA Vol (A2C):   79.7 ml 45.18 ml/m  RA Volume:   17.70 ml 10.03 ml/m LA Vol (A4C):   52.4 ml 29.70 ml/m LA Biplane Vol: 65.3 ml 37.01 ml/m  AORTIC VALVE LVOT Vmax:   122.00 cm/s LVOT Vmean:  81.900 cm/s LVOT VTI:    0.227 m  AORTA Ao Root diam: 2.60 cm Ao Asc diam:  2.60 cm MITRAL VALVE MV Area (PHT): 3.53 cm     SHUNTS MV Decel Time: 215 msec     Systemic VTI:  0.23 m MV E velocity: 98.10 cm/s   Systemic Diam: 1.90 cm MV A velocity: 128.00 cm/s MV E/A ratio:  0.77 Jenkins Rouge MD Electronically signed by Jenkins Rouge MD Signature Date/Time: 05/21/2021/9:59:54 AM    Final    Medications:  sodium chloride      amLODipine  10 mg Oral Daily   aspirin EC  81 mg Oral Daily   atorvastatin  80 mg Oral Daily   bictegravir-emtricitabine-tenofovir AF  1 tablet Oral Daily   calcitRIOL  0.25 mcg Oral Q T,Th,Sa-HD   calcium acetate  2,001 mg Oral TID WC   Chlorhexidine Gluconate Cloth  6 each Topical Q0600   clopidogrel  75 mg Oral Daily   heparin injection (subcutaneous)  5,000 Units Subcutaneous Q8H   hydrALAZINE  75 mg Oral TID   insulin aspart  0-6 Units Subcutaneous TID WC   insulin glargine-yfgn  8 Units Subcutaneous QHS   ketorolac  30 mg Intravenous Once   metoprolol tartrate   25 mg Oral BID   mupirocin ointment  1 application Nasal BID   pantoprazole  40 mg Oral Daily   sertraline  50 mg Oral QHS   sodium chloride flush  3 mL Intravenous Q12H   sodium chloride flush  3 mL Intravenous Q12H   sodium zirconium cyclosilicate  10 g Oral [id number]    Dialysis Orders: [facility] TTS 4h  400/A1.5x EDW  67.5kg 2K/2Ca  AVF Heparin 3000 U  -Venofer 100 mg  x 10 (until 10/15)  -Mircera 150 q 2 weeks  (last 10/6)  -Calcitriol 0.25 TIW    Assessment/Plan: ESRD: Continue HD per usual TTS schedule - next tomorrow, likely as OP. Hyperkalemia: Better today, has been getting Lokelma QD here. NSTEMI -  per cardiology. S/p LHC 10/12 with  PCI/DES to LAD. Aspirin/Plavix x 12 months. Remaions on statin and BB. Headache/?migraine - Per primary. No acute findings on head CT.  Hypertension/volume: BP back up today, but was good post-HD. Continue current meds and will try to lower EDW  further. Anemia  - Hgb 10.7, not due for ESA yet. Metabolic bone disease -  Ca good, Phos pending. Continue home binders.  Nutrition - Renal diet with fluid restriction.   Veneta Penton, PA-C 05/23/2021, 8:15 AM  [employer]

## 2021-05-23 NOTE — Progress Notes (Signed)
Spoke with internal medicine team and advised that pt expressed the need for wound care. Per verbal from Dr. Charleen Kirks for pt just to do wet to dry dressings on wounds and to discuss further care with out patient provider.

## 2021-05-23 NOTE — Consult Note (Signed)
   [facility] Inpatient Consult   05/23/2021  DARCEY DEER August 09, 1976 [id number]  [place] [employer] [ACO] Patient [place] Medicare  Primary Care Provider: [name], MD, [facility] Internal Medicine, an Embedded provider with a chronic care management team and program.  Patient screened for less than 30 days readmission hospitalization with noted extreme high risk score for unplanned readmission risk and to assess for potential [place] Management service needs for post hospital transition.    Plan:  Continue to follow progress and disposition to assess for post hospital care management needs.  Patient could benefit from chronic care management, patient sound asleep on round, spoke with inpatient [facility] RNCM and confirmed CCM would be a good referral for post hospital transition.  For questions contact:   Natividad Brood, RN BSN [facility] Liaison  [phone] business mobile phone Toll free office [phone]  Fax number: [phone] [email or web address]

## 2021-05-23 NOTE — Progress Notes (Signed)
  Progress Note   Date: 05/23/2021  Patient Name: Kathy Frank        MRN#: [id number]  Review the patient's clinical findings supports the diagnosis of:   Hyponatremia , mild, not clinically significant

## 2021-05-26 ENCOUNTER — Telehealth: Payer: Self-pay | Admitting: *Deleted

## 2021-05-26 NOTE — Chronic Care Management (AMB) (Signed)
  Care Management   Outreach Note  05/26/2021 Name: Kathy Frank MRN: [id number] DOB: 06/20/1976  Referred by: [name], MD Reason for referral : Care Coordination (Initial outreach to schedule referral with [facility] )   An unsuccessful telephone outreach was attempted today. The patient was referred to the case management team for assistance with care management and care coordination.   Follow Up Plan:  The care management team will reach out to the patient again over the next 7 days. If patient returns call to provider office, please advise to call [place] at [phone].  [place] Management  Direct Dial: [phone]

## 2021-05-27 ENCOUNTER — Ambulatory Visit (INDEPENDENT_AMBULATORY_CARE_PROVIDER_SITE_OTHER): Payer: Medicare Other | Admitting: Internal Medicine

## 2021-05-27 DIAGNOSIS — I214 Non-ST elevation (NSTEMI) myocardial infarction: Secondary | ICD-10-CM

## 2021-05-27 DIAGNOSIS — R197 Diarrhea, unspecified: Secondary | ICD-10-CM | POA: Diagnosis not present

## 2021-05-27 DIAGNOSIS — R040 Epistaxis: Secondary | ICD-10-CM

## 2021-05-27 DIAGNOSIS — D631 Anemia in chronic kidney disease: Secondary | ICD-10-CM | POA: Diagnosis not present

## 2021-05-27 DIAGNOSIS — Z992 Dependence on renal dialysis: Secondary | ICD-10-CM | POA: Diagnosis not present

## 2021-05-27 DIAGNOSIS — N2581 Secondary hyperparathyroidism of renal origin: Secondary | ICD-10-CM | POA: Diagnosis not present

## 2021-05-27 DIAGNOSIS — N186 End stage renal disease: Secondary | ICD-10-CM | POA: Diagnosis not present

## 2021-05-27 DIAGNOSIS — D689 Coagulation defect, unspecified: Secondary | ICD-10-CM | POA: Diagnosis not present

## 2021-05-27 DIAGNOSIS — D509 Iron deficiency anemia, unspecified: Secondary | ICD-10-CM | POA: Diagnosis not present

## 2021-05-27 DIAGNOSIS — E1110 Type 2 diabetes mellitus with ketoacidosis without coma: Secondary | ICD-10-CM | POA: Diagnosis not present

## 2021-05-27 HISTORY — DX: Epistaxis: R04.0

## 2021-05-27 NOTE — Progress Notes (Signed)
   CC: Hospital follow up  This is a telephone encounter between Kathy Frank and Kathy Frank on 05/27/2021 for cc listed above. The visit was conducted with the patient located at home and Kathy Frank at [facility]. The patient's identity was confirmed using their DOB and current address. The patient has consented to being evaluated through a telephone encounter and understands the associated risks (an examination cannot be done and the patient may need to come in for an appointment) / benefits (allows the patient to remain at home, decreasing exposure to coronavirus). I personally spent 20 minutes on medical discussion.   HPI:  [name] is a 45 y.o. with PMH as below.   Please see A&P for assessment of the patient's acute and chronic medical conditions.   Past Medical History:  Diagnosis Date   Anal abscess    chronic   Anxiety    Depression 06/28/2006   Qualifier: Diagnosis of  By: Riccardo Dubin MD, Todd     Diabetes mellitus type 2 in obese ([place]) 06/28/1994   Dyspnea    uses oxygeb 2 liters per minute at dialysis   End stage renal disease on dialysis [facility])    Erosive esophagitis    Esophageal reflux    Eye redness    Gastroparesis    ? diabetic   GERD (gastroesophageal reflux disease)    History of blood transfusion 2019   Human immunodeficiency virus (HIV) disease ([place]) 04/23/2016   Hypertension    Metabolic bone disease [id number]   Moderate nonproliferative diabetic retinopathy of both eyes ([place]) 11/21/2014   11/14/14: Noted on retinal imaging; needs follow-up imaging in 6 months  05/22/16: Noted on retinal imaging again; needs follow-up imaging in 6 months   Type 2 diabetes mellitus with diabetic peripheral angiopathy without gangrene ([place]) 05/01/2019   Wears dentures    lower   Wound infection s/p L transmetatarsal amputation    Review of Systems  Constitutional:  Negative for chills and fever.  HENT:  Positive for nosebleeds.   Respiratory:  Negative for  shortness of breath.   Cardiovascular:  Negative for chest pain.  Gastrointestinal:  Negative for constipation, diarrhea, nausea and vomiting.  Neurological:  Negative for dizziness, weakness and headaches.      Assessment & Plan:   See Encounters Tab for problem based charting.  Patient seen with Dr. Deretha [facility] Internal Medicine Resident

## 2021-05-27 NOTE — Assessment & Plan Note (Signed)
Patient was hospitalized for NSTEMI on 05/20/2021.  Patient presented to the ED with chest pain and shortness of breath and hypertensive emergency.  Cardiac cath was indicated and showed LAD 70% stenosed and stent was placed.  Patient was discharged on DAPT.  Currently patient denies chest pain, shortness of breath, headache, changes in vision, lightheadedness, syncope or any other concerns. Patient reports compliance to medications.  PLAN: Continue DAPT therapy as prescribed Patient has follow-up appointment with cardiology

## 2021-05-27 NOTE — Telephone Encounter (Signed)
Patient discharged, front desk to reach out to schedule follow up appointment.

## 2021-05-27 NOTE — Assessment & Plan Note (Addendum)
Patient states that she has been experiencing epistaxis since this morning.  She has tried packing her nose with tissue and using ice pack without cessation of the bleeding.  Patient denies any associated lightheadedness, weakness, dizziness or syncope.  Patient states that when she removes the tissue from her nose she notices small blood clots and drops of blood coming out of her nose.  Patient states that this has happened in the past, however, the bleeding stops after several minutes.  Patient states this is the first time the nosebleed has lasted all day.  Patient recently started on antiplatelet therapy as of 05/20/2021. This could contribute to her prolonged bleeding time.   PLAN: Recommended Afrin OTC to help stop the nose bleed Advised to seek medical attention if bleeding persists nor worsens

## 2021-05-28 ENCOUNTER — Telehealth: Payer: Self-pay

## 2021-05-28 NOTE — Progress Notes (Signed)
Internal Medicine Clinic Attending  I spoke with the patient over the phone.  I personally confirmed the key portions of the history and exam documented by Dr. Jeanice Lim and I reviewed pertinent patient test results.  The assessment, diagnosis, and plan were formulated together and I agree with the documentation in the resident's note.

## 2021-05-28 NOTE — Telephone Encounter (Signed)
-----   Message from [place] sent at 05/28/2021  4:26 PM EDT ----- Called patient to get an appointment scheduled, she didn't want to make an appointment due to just getting out of the hospital stating she had a heart attack.Marland Kitchen ----- Message ----- From: Beryle Flock, RN Sent: 05/27/2021  12:12 PM EDT To: [facility] Support Pool  Patient needs follow up appointment, thank you!

## 2021-05-29 ENCOUNTER — Other Ambulatory Visit (HOSPITAL_COMMUNITY): Payer: Self-pay

## 2021-05-29 ENCOUNTER — Other Ambulatory Visit: Payer: Self-pay | Admitting: Internal Medicine

## 2021-05-29 DIAGNOSIS — N186 End stage renal disease: Secondary | ICD-10-CM | POA: Diagnosis not present

## 2021-05-29 DIAGNOSIS — Z992 Dependence on renal dialysis: Secondary | ICD-10-CM | POA: Diagnosis not present

## 2021-05-29 DIAGNOSIS — E1110 Type 2 diabetes mellitus with ketoacidosis without coma: Secondary | ICD-10-CM | POA: Diagnosis not present

## 2021-05-29 DIAGNOSIS — D509 Iron deficiency anemia, unspecified: Secondary | ICD-10-CM | POA: Diagnosis not present

## 2021-05-29 DIAGNOSIS — D689 Coagulation defect, unspecified: Secondary | ICD-10-CM | POA: Diagnosis not present

## 2021-05-29 DIAGNOSIS — N2581 Secondary hyperparathyroidism of renal origin: Secondary | ICD-10-CM | POA: Diagnosis not present

## 2021-05-29 DIAGNOSIS — D631 Anemia in chronic kidney disease: Secondary | ICD-10-CM | POA: Diagnosis not present

## 2021-05-29 DIAGNOSIS — B20 Human immunodeficiency virus [HIV] disease: Secondary | ICD-10-CM

## 2021-05-29 DIAGNOSIS — R197 Diarrhea, unspecified: Secondary | ICD-10-CM | POA: Diagnosis not present

## 2021-05-29 MED ORDER — BIKTARVY 50-200-25 MG PO TABS
1.0000 | ORAL_TABLET | Freq: Every day | ORAL | 0 refills | Status: DC
  Filled 2021-05-29: qty 30, 30d supply, fill #0

## 2021-05-30 ENCOUNTER — Encounter: Payer: Medicare Other | Admitting: Internal Medicine

## 2021-05-30 ENCOUNTER — Ambulatory Visit (INDEPENDENT_AMBULATORY_CARE_PROVIDER_SITE_OTHER): Payer: Medicare Other | Admitting: Internal Medicine

## 2021-05-30 DIAGNOSIS — I10 Essential (primary) hypertension: Secondary | ICD-10-CM | POA: Diagnosis not present

## 2021-05-30 DIAGNOSIS — K219 Gastro-esophageal reflux disease without esophagitis: Secondary | ICD-10-CM | POA: Diagnosis not present

## 2021-05-30 DIAGNOSIS — R32 Unspecified urinary incontinence: Secondary | ICD-10-CM

## 2021-05-30 DIAGNOSIS — E118 Type 2 diabetes mellitus with unspecified complications: Secondary | ICD-10-CM | POA: Diagnosis not present

## 2021-05-30 DIAGNOSIS — T8781 Dehiscence of amputation stump: Secondary | ICD-10-CM | POA: Diagnosis not present

## 2021-05-30 DIAGNOSIS — N185 Chronic kidney disease, stage 5: Secondary | ICD-10-CM | POA: Diagnosis not present

## 2021-05-30 DIAGNOSIS — Z89511 Acquired absence of right leg below knee: Secondary | ICD-10-CM | POA: Diagnosis not present

## 2021-05-30 DIAGNOSIS — I739 Peripheral vascular disease, unspecified: Secondary | ICD-10-CM | POA: Diagnosis not present

## 2021-05-30 DIAGNOSIS — R159 Full incontinence of feces: Secondary | ICD-10-CM | POA: Diagnosis not present

## 2021-05-30 DIAGNOSIS — Z794 Long term (current) use of insulin: Secondary | ICD-10-CM | POA: Diagnosis not present

## 2021-05-30 DIAGNOSIS — E1165 Type 2 diabetes mellitus with hyperglycemia: Secondary | ICD-10-CM | POA: Diagnosis not present

## 2021-05-30 DIAGNOSIS — E785 Hyperlipidemia, unspecified: Secondary | ICD-10-CM | POA: Diagnosis not present

## 2021-05-30 NOTE — Progress Notes (Signed)
   CC: Incontinence order supply   This is a telephone encounter between Kathy Frank and Kathy Frank on 05/30/2021 for cc listed above. The visit was conducted with the patient located at home and Kathy Frank at [facility]. The patient's identity was confirmed using their DOB and current address. The patient has consented to being evaluated through a telephone encounter and understands the associated risks (an examination cannot be done and the patient may need to come in for an appointment) / benefits (allows the patient to remain at home, decreasing exposure to coronavirus). I personally spent 15 minutes on medical discussion.   HPI:  [name] is a 45 y.o. with PMH as below.   Please see A&P for assessment of the patient's acute and chronic medical conditions.   Past Medical History:  Diagnosis Date   Anal abscess    chronic   Anxiety    Depression 06/28/2006   Qualifier: Diagnosis of  By: Riccardo Dubin MD, Todd     Diabetes mellitus type 2 in obese ([place]) 06/28/1994   Dyspnea    uses oxygeb 2 liters per minute at dialysis   End stage renal disease on dialysis [facility])    Erosive esophagitis    Esophageal reflux    Eye redness    Gastroparesis    ? diabetic   GERD (gastroesophageal reflux disease)    History of blood transfusion 2019   Human immunodeficiency virus (HIV) disease ([place]) 04/23/2016   Hypertension    Metabolic bone disease 87/56/4332   Moderate nonproliferative diabetic retinopathy of both eyes ([place]) 11/21/2014   11/14/14: Noted on retinal imaging; needs follow-up imaging in 6 months  05/22/16: Noted on retinal imaging again; needs follow-up imaging in 6 months   Type 2 diabetes mellitus with diabetic peripheral angiopathy without gangrene ([place]) 05/01/2019   Wears dentures    lower   Wound infection s/p L transmetatarsal amputation    Review of Systems  Constitutional:  Negative for chills and fever.  Respiratory:  Negative for shortness of breath.    Cardiovascular:  Negative for chest pain.  Genitourinary:        Urinary and fecal Incontinence       Assessment & Plan:   See Encounters Tab for problem based charting.  Patient seen with Dr. Jadene Pierini Internal Medicine Resident

## 2021-05-30 NOTE — Assessment & Plan Note (Addendum)
Patient has been expreincing bowel and bladder incontinuence for the past several months. Patient has uncontrolled W1UX with complications leading to incontinence.  And she is a bilat BKA patients with difficulties getting to the bathroom unassisted.  Patient states is difficult to get up at night to go to the bathroom.  She requires assistance.  Patient states she has no function of her bowel or bladder especially at night.  Patient uses up to 4-5 disposable underwear's daily.  Patient also utilizes chux for her bed as she has incontinence throughout the night.   PLAN: Completion of incontinence order form; patient will need adult large pull-ups, Chux, gloves, and wipes.

## 2021-05-31 DIAGNOSIS — D509 Iron deficiency anemia, unspecified: Secondary | ICD-10-CM | POA: Diagnosis not present

## 2021-05-31 DIAGNOSIS — D689 Coagulation defect, unspecified: Secondary | ICD-10-CM | POA: Diagnosis not present

## 2021-05-31 DIAGNOSIS — N2581 Secondary hyperparathyroidism of renal origin: Secondary | ICD-10-CM | POA: Diagnosis not present

## 2021-05-31 DIAGNOSIS — E1110 Type 2 diabetes mellitus with ketoacidosis without coma: Secondary | ICD-10-CM | POA: Diagnosis not present

## 2021-05-31 DIAGNOSIS — N186 End stage renal disease: Secondary | ICD-10-CM | POA: Diagnosis not present

## 2021-05-31 DIAGNOSIS — Z992 Dependence on renal dialysis: Secondary | ICD-10-CM | POA: Diagnosis not present

## 2021-05-31 DIAGNOSIS — D631 Anemia in chronic kidney disease: Secondary | ICD-10-CM | POA: Diagnosis not present

## 2021-05-31 DIAGNOSIS — R197 Diarrhea, unspecified: Secondary | ICD-10-CM | POA: Diagnosis not present

## 2021-06-02 NOTE — Chronic Care Management (AMB) (Signed)
  Care Management   Outreach Note  06/02/2021 Name: Kathy Frank MRN: [id number] DOB: [birth year]  Referred by: [name], MD Reason for referral : Care Coordination (Initial outreach to schedule referral with [facility] )   A second unsuccessful telephone outreach was attempted today. The patient was referred to the case management team for assistance with care management and care coordination.   Follow Up Plan:  A HIPAA compliant phone message was left for the patient providing contact information and requesting a return call. The care management team will reach out to the patient again over the next 7 days. If patient returns call to provider office, please advise to call [place] at [phone].  [place] Management  Direct Dial: [phone]

## 2021-06-02 NOTE — Progress Notes (Deleted)
Cardiology Office Note:    Date:  06/02/2021   ID:  Kathy Frank, DOB [birth year], MRN [id number]  PCP:  [name], MD  Cardiologist:  None  Electrophysiologist:  None   Referring MD: [name], MD   Chief Complaint: hospital follow-up after NSTEMI  History of Present Illness:    Kathy Frank is a 45 y.o. female with a history of CAD with recent NSTEMI s/p DES to LAD on 05/21/2021, PAD s/p left above knee amputation and right below knee amputation, CVA, hypertension, hyperlipidemia, type 2 diabetes mellitus, ESRD on hemodialysis on T/Th/Sat, and HIV who is followed by Dr. Harriet Masson and presents today for hospital follow-up after NSTEMI.  Patient has a long history of PAD with multiple amputations of left lower extremity ultimately leading to a left above knee amputation in 12/2020. She then had a right below knee amputation in 01/2021 and then redo right below knee amputation in 03/2021 due to infection.   Patient was recently admitted from 05/20/2021 to 05/23/2021 for NSTEMI and hypertensive emergency after presenting with chest pain, shortness of breath, and acute onset headache.  Head CT showed no acute findings. High-sensitivity troponin peaked at 967. Echo showed LVEF of 60-65% with normal wall motion and moderate LVH. Left cardiac catheterization showed 75% stenosis of mid LAD with non-obstructive CAD elsewhere. She underwent successful PCI with DES to LAD lesion. She was started on DAPT with Aspirin and Plavix. Lipitor was increased to 80mg  daily and it was also recommended that Zetia be added.   Patient presents today for follow-up. ***  CAD with Recent NSTEMI - Recently admitted with NSTEMI and found to have severe single vessel CAD involving the LAD s/p DES. - No angina.  - Continue DAPT with Aspirin and Plavix.  - Continue beta-blocker and high-intensity statin.  PAD - S/p multiple amputations of left lower extremity ultimately leading to a left above knee amputation  in 12/2020. She then had a right below knee amputation in 01/2021 and then redo right below knee amputation in 03/2021 due to infection.  - Continue antiplatelet therapy and statin therapy. - Followed by Vascular Surgery.  Hypertension - *** - Current medications: Amlodipine 10mg  daily, Lopressor 25mg  twice daily, Hydralazine 75mg  three times daily.  - *** Switch to Coreg ***  Hyperlipidemia - Lipid panel during recent admission: Total Cholesterol 177, Triglycerides 214, HDL 38, LDL 96.  - LDL goal <70 given CAD. - Lipitor increased to 80mg  daily during recent admission. Continue.  - Zetia *** - Will repeat lipid panel and LFTs in 6-8 weeks.   Type 2 Diabetes Mellitus - Hemoglobin A2c 7.1 in 04/2021. - On Victozo and Insulin at home.  - Management per PCP.  ESRD  - On hemodialysis T/Th/Sat. - Management per Nephrology.  Past Medical History:  Diagnosis Date   Anal abscess    chronic   Anxiety    Depression 06/28/2006   Qualifier: Diagnosis of  By: Riccardo Dubin MD, Todd     Diabetes mellitus type 2 in obese ([place]) 06/28/1994   Dyspnea    uses oxygeb 2 liters per minute at dialysis   End stage renal disease on dialysis [place] [facility])    Erosive esophagitis    Esophageal reflux    Eye redness    Gastroparesis    ? diabetic   GERD (gastroesophageal reflux disease)    History of blood transfusion 2019   Human immunodeficiency virus (HIV) disease ([place]) 04/23/2016   Hypertension    Metabolic bone disease  05/02/2019   Moderate nonproliferative diabetic retinopathy of both eyes ([place]) 11/21/2014   11/14/14: Noted on retinal imaging; needs follow-up imaging in 6 months  05/22/16: Noted on retinal imaging again; needs follow-up imaging in 6 months   Type 2 diabetes mellitus with diabetic peripheral angiopathy without gangrene ([place]) 05/01/2019   Wears dentures    lower   Wound infection s/p L transmetatarsal amputation     Past Surgical History:  Procedure Laterality Date   ABDOMINAL  AORTOGRAM W/LOWER EXTREMITY N/A 12/13/2020   Procedure: ABDOMINAL AORTOGRAM W/LOWER EXTREMITY;  Surgeon: Angelia Mould, MD;  Location: [place] [facility];  Service: Cardiovascular;  Laterality: N/A;   AMPUTATION Left 07/08/2018   Procedure: AMPUTATION FORTH RAY LEFT FOOT;  Surgeon: [name], MD;  Location: [place];  Service: Orthopedics;  Laterality: Left;   AMPUTATION Left 08/09/2018   Procedure: Left Transmetatarsal Amputation;  Surgeon: [name], MD;  Location: [place];  Service: Orthopedics;  Laterality: Left;   AMPUTATION Left 10/08/2018   Procedure: LEFT BELOW KNEE AMPUTATION;  Surgeon: [name], MD;  Location: [place];  Service: Orthopedics;  Laterality: Left;   AMPUTATION Left 10/28/2018   Procedure: REVISION BELOW KNEE AMPUTATION;  Surgeon: [name], MD;  Location: [place];  Service: Orthopedics;  Laterality: Left;   AMPUTATION Left 12/16/2018   Procedure: LEFT ABOVE KNEE AMPUTATION;  Surgeon: [name], MD;  Location: [place];  Service: Orthopedics;  Laterality: Left;   AMPUTATION Right 01/31/2021   Procedure: AMPUTATION BELOW KNEE;  Surgeon: Serafina Mitchell, MD;  Location: [facility] OR;  Service: Vascular;  Laterality: Right;   AMPUTATION Right 03/28/2021   Procedure: REVISION OF RIGHT BELOW KNEE AMPUTATION;  Surgeon: Serafina Mitchell, MD;  Location: [place];  Service: Vascular;  Laterality: Right;   AMPUTATION Right 04/18/2021   Procedure: right below knee amputation/washout placement wound vac;  Surgeon: Cherre Robins, MD;  Location: [place];  Service: Vascular;  Laterality: Right;   AV FISTULA PLACEMENT Left 10/14/2018   Procedure: Arteriovenous (Av) Fistula Creation Left Arm;  Surgeon: [name], MD;  Location: [place];  Service: Vascular;  Laterality: Left;   [place] Left 04/14/2019   Procedure: BASILIC VEIN TRANSPOSITION SECOND STAGE LEFT ARM;  Surgeon: Rosetta Posner, MD;  Location: [place];  Service: Vascular;  Laterality: Left;   CORONARY  STENT INTERVENTION N/A 05/21/2021   Procedure: CORONARY STENT INTERVENTION;  Surgeon: Leonie Man, MD;  Location: [place] [facility];  Service: Cardiovascular;  Laterality: N/A;   INCISION AND DRAINAGE ABSCESS N/A 10/24/2020   Procedure: exicision of hydradenitis;  Surgeon: Leighton Ruff, MD;  Location: [place];  Service: General;  Laterality: N/A;  45 min   INSERTION OF DIALYSIS CATHETER Right 04/14/2019   Procedure: INSERTION OF DIALYSIS CATHETER;  Surgeon: Rosetta Posner, MD;  Location: [facility] OR;  Service: Vascular;  Laterality: Right;   LEFT HEART CATH AND CORONARY ANGIOGRAPHY N/A 05/21/2021   Procedure: LEFT HEART CATH AND CORONARY ANGIOGRAPHY;  Surgeon: Leonie Man, MD;  Location: [place] [facility];  Service: Cardiovascular;  Laterality: N/A;   LOWER EXTREMITY ANGIOGRAPHY N/A 07/05/2018   Procedure: LOWER EXTREMITY ANGIOGRAPHY;  Surgeon: Serafina Mitchell, MD;  Location: [place] [facility];  Service: Cardiovascular;  Laterality: N/A;   PERIPHERAL VASCULAR ATHERECTOMY Right 12/13/2020   Procedure: PERIPHERAL VASCULAR ATHERECTOMY;  Surgeon: Angelia Mould, MD;  Location: [place] [facility];  Service: Cardiovascular;  Laterality: Right;  Superficial femoral   PERIPHERAL VASCULAR BALLOON ANGIOPLASTY Left 07/05/2018   Procedure: PERIPHERAL VASCULAR BALLOON ANGIOPLASTY;  Surgeon: Serafina Mitchell, MD;  Location: [place] [facility];  Service: Cardiovascular;  Laterality: Left;  SFA   PERIPHERAL VASCULAR BALLOON ANGIOPLASTY Right 12/13/2020   Procedure: PERIPHERAL VASCULAR BALLOON ANGIOPLASTY;  Surgeon: Angelia Mould, MD;  Location: [place] [facility];  Service: Cardiovascular;  Laterality: Right;  Peroneal artery, anterior tibial artery   RECTAL EXAM UNDER ANESTHESIA N/A 10/24/2020   Procedure: EXAM UNDER ANESTHESIA;  Surgeon: Leighton Ruff, MD;  Location: [facility];  Service: General;  Laterality: N/A;   STUMP REVISION Left 10/19/2018    Procedure: REVISION LEFT BELOW KNEE AMPUTATION;  Surgeon: [name], MD;  Location: [place];  Service: Orthopedics;  Laterality: Left;   TUBAL LIGATION  2002    Current Medications: No outpatient medications have been marked as taking for the 06/06/21 encounter (Appointment) with Darreld Mclean, PA-C.     Allergies:   Patient has no known allergies.   Social History   Socioeconomic History   Marital status: Single    Spouse name: Not on file   Number of children: Not on file   Years of education: 10   Highest education level: Not on file  Occupational History    Employer: UNEMPLOYED  Tobacco Use   Smoking status: Former    Packs/day: 0.10    Years: 10.00    Pack years: 1.00    Types: Cigarettes    Start date: 11/17/2013    Quit date: 09/09/2014    Years since quitting: 6.7   Smokeless tobacco: Never  Vaping Use   Vaping Use: Never used  Substance and Sexual Activity   Alcohol use: Not Currently   Drug use: Not Currently    Types: Marijuana    Comment: 2 times a monthy -  `last time Juily 2022   Sexual activity: Not Currently    Partners: Female, Female  Other Topics Concern   Not on file  Social History Narrative   Not on file   Social Determinants of Health   Financial Resource Strain: Not on file  Food Insecurity: Not on file  Transportation Needs: Not on file  Physical Activity: Not on file  Stress: Not on file  Social Connections: Not on file     Family History: The patient's family history includes Diabetes in her brother, daughter, daughter, maternal grandmother, and mother; Mental retardation in her brother.  ROS:   Please see the history of present illness.     EKGs/Labs/Other Studies Reviewed:    The following studies were reviewed today:  Echocardiogram 05/21/2021: Impressions:  1. Left ventricular ejection fraction, by estimation, is 60 to 65%. The  left ventricle has normal function. The left ventricle has no regional  wall motion  abnormalities. There is moderate left ventricular hypertrophy.  Left ventricular diastolic  parameters are indeterminate.   2. Right ventricular systolic function is normal. The right ventricular  size is normal.   3. Left atrial size was mildly dilated.   4. The pericardial effusion is lateral to the left ventricle.   5. The mitral valve is degenerative. Trivial mitral valve regurgitation.  No evidence of mitral stenosis. Severe mitral annular calcification.   6. The aortic valve is calcified. Aortic valve regurgitation is trivial.  Moderate sclerosis with no stenosis.   7. The inferior vena cava is normal in size with greater than 50%  respiratory  variability, suggesting right atrial pressure of 3 mmHg. _______________  Left Cardiac Catheterization 05/21/2021:   Mid LAD lesion is 75% stenosed.   A drug-eluting stent was successfully placed using a [place] [id number].  Deployed to 3.0 mm   Post intervention, there is a 0% residual stenosis.   ---------------------------------   Prox RCA lesion is 35% stenosed.  Dist RCA lesion is 40% stenosed.   ---------------------------------   LV end diastolic pressure is normal.   There is no aortic valve stenosis.   Summary: Severe single-vessel CAD with 70% mid LAD stenosis and moderate 30 to 40% proximal and distal RCA stenosis. Successful DES PCI of mid LAD using Onyx Frontier DES 2.7 by milligray 15 mm deployed to 3.0 mm.  Reduced lesion to 0% with TIMI-3 flow pre and post Normal LVEDP with normal EF by Echocardiogram.    Recommendations: Transfer to 6 E. postprocedural unit for ongoing post cath/PCI care with sheath removal per protocol She is already on aspirin and Plavix, continue long-term therapy (uninterrupted for 1 year) Continue to titrate guideline directed medical management for CAD-have increased atorvastatin to 80 mg.  Diagnostic Dominance: Right Intervention    EKG:  EKG ordered today. EKG personally reviewed  and demonstrates ***.  Recent Labs: 05/22/2021: ALT 8; BUN 41; Creatinine, Ser 7.22; Hemoglobin 10.7; Platelets 420; Potassium 4.5; Sodium 131  Recent Lipid Panel    Component Value Date/Time   CHOL 177 05/22/2021 0317   CHOL 201 (H) 04/23/2016 1535   TRIG 214 (H) 05/22/2021 0317   HDL 38 (L) 05/22/2021 0317   HDL 54 04/23/2016 1535   CHOLHDL 4.7 05/22/2021 0317   VLDL 43 (H) 05/22/2021 0317   LDLCALC 96 05/22/2021 0317   LDLCALC 121 (H) 04/23/2016 1535    Physical Exam:    Vital Signs: There were no vitals taken for this visit.    Wt Readings from Last 3 Encounters:  05/23/21 148 lb 2.4 oz (67.2 kg)  05/02/21 149 lb 14.6 oz (68 kg)  04/22/21 152 lb 12.5 oz (69.3 kg)     General: 45 y.o. female in no acute distress. HEENT: Normocephalic and atraumatic. Sclera clear. EOMs intact. Neck: Supple. No carotid bruits. No JVD. Heart: *** RRR. Distinct S1 and S2. No murmurs, gallops, or rubs. Radial and distal pedal pulses 2+ and equal bilaterally. Lungs: No increased work of breathing. Clear to ausculation bilaterally. No wheezes, rhonchi, or rales.  Abdomen: Soft, non-distended, and non-tender to palpation. Bowel sounds present in all 4 quadrants.  MSK: Normal strength and tone for age. *** Extremities: No lower extremity edema.    Skin: Warm and dry. Neuro: Alert and oriented x3. No focal deficits. Psych: Normal affect. Responds appropriately.   Assessment:    No diagnosis found.  Plan:     Disposition: Follow up in ***   Medication Adjustments/Labs and Tests Ordered: Current medicines are reviewed at length with the patient today.  Concerns regarding medicines are outlined above.  No orders of the defined types were placed in this encounter.  No orders of the defined types were placed in this encounter.   There are no Patient Instructions on file for this visit.   Signed, Darreld Mclean, PA-C  06/02/2021 3:39 PM    [facility]

## 2021-06-03 DIAGNOSIS — N2581 Secondary hyperparathyroidism of renal origin: Secondary | ICD-10-CM | POA: Diagnosis not present

## 2021-06-03 DIAGNOSIS — D689 Coagulation defect, unspecified: Secondary | ICD-10-CM | POA: Diagnosis not present

## 2021-06-03 DIAGNOSIS — D631 Anemia in chronic kidney disease: Secondary | ICD-10-CM | POA: Diagnosis not present

## 2021-06-03 DIAGNOSIS — N186 End stage renal disease: Secondary | ICD-10-CM | POA: Diagnosis not present

## 2021-06-03 DIAGNOSIS — Z992 Dependence on renal dialysis: Secondary | ICD-10-CM | POA: Diagnosis not present

## 2021-06-03 DIAGNOSIS — D509 Iron deficiency anemia, unspecified: Secondary | ICD-10-CM | POA: Diagnosis not present

## 2021-06-03 DIAGNOSIS — E1110 Type 2 diabetes mellitus with ketoacidosis without coma: Secondary | ICD-10-CM | POA: Diagnosis not present

## 2021-06-03 DIAGNOSIS — R197 Diarrhea, unspecified: Secondary | ICD-10-CM | POA: Diagnosis not present

## 2021-06-03 NOTE — Progress Notes (Signed)
Internal Medicine Clinic Attending ? ?Case discussed with Dr. Ariwodo  At the time of the visit.  We reviewed the resident?s history and exam and pertinent patient test results.  I agree with the assessment, diagnosis, and plan of care documented in the resident?s note.  ?

## 2021-06-05 ENCOUNTER — Encounter: Payer: Self-pay | Admitting: Student

## 2021-06-05 NOTE — Chronic Care Management (AMB) (Signed)
  Care Management   Outreach Note  06/05/2021 Name: Kathy Frank MRN: [id number] DOB: 01-12-1976  Referred by: [name], MD Reason for referral : Care Coordination (Initial outreach to schedule referral with [facility] )   Third unsuccessful telephone outreach was attempted today. The patient was referred to the case management team for assistance with care management and care coordination. The patient's primary care provider has been notified of our unsuccessful attempts to make or maintain contact with the patient. The care management team is pleased to engage with this patient at any time in the future should he/she be interested in assistance from the care management team.   Follow Up Plan:  We have been unable to make contact with the patient. The care management team is available to follow up with the patient after provider conversation with the patient regarding recommendation for care management engagement and subsequent re-referral to the care management team.   [place] Management  Direct Dial: [phone]

## 2021-06-06 ENCOUNTER — Ambulatory Visit: Payer: Medicare Other | Admitting: Student

## 2021-06-07 DIAGNOSIS — R197 Diarrhea, unspecified: Secondary | ICD-10-CM | POA: Diagnosis not present

## 2021-06-07 DIAGNOSIS — E1110 Type 2 diabetes mellitus with ketoacidosis without coma: Secondary | ICD-10-CM | POA: Diagnosis not present

## 2021-06-07 DIAGNOSIS — N2581 Secondary hyperparathyroidism of renal origin: Secondary | ICD-10-CM | POA: Diagnosis not present

## 2021-06-07 DIAGNOSIS — D689 Coagulation defect, unspecified: Secondary | ICD-10-CM | POA: Diagnosis not present

## 2021-06-07 DIAGNOSIS — D509 Iron deficiency anemia, unspecified: Secondary | ICD-10-CM | POA: Diagnosis not present

## 2021-06-07 DIAGNOSIS — D631 Anemia in chronic kidney disease: Secondary | ICD-10-CM | POA: Diagnosis not present

## 2021-06-07 DIAGNOSIS — N186 End stage renal disease: Secondary | ICD-10-CM | POA: Diagnosis not present

## 2021-06-07 DIAGNOSIS — Z992 Dependence on renal dialysis: Secondary | ICD-10-CM | POA: Diagnosis not present

## 2021-06-09 DIAGNOSIS — N186 End stage renal disease: Secondary | ICD-10-CM | POA: Diagnosis not present

## 2021-06-09 DIAGNOSIS — E1129 Type 2 diabetes mellitus with other diabetic kidney complication: Secondary | ICD-10-CM | POA: Diagnosis not present

## 2021-06-09 DIAGNOSIS — Z992 Dependence on renal dialysis: Secondary | ICD-10-CM | POA: Diagnosis not present

## 2021-06-10 DIAGNOSIS — N186 End stage renal disease: Secondary | ICD-10-CM | POA: Diagnosis not present

## 2021-06-10 DIAGNOSIS — Z992 Dependence on renal dialysis: Secondary | ICD-10-CM | POA: Diagnosis not present

## 2021-06-10 DIAGNOSIS — N2581 Secondary hyperparathyroidism of renal origin: Secondary | ICD-10-CM | POA: Diagnosis not present

## 2021-06-10 DIAGNOSIS — D689 Coagulation defect, unspecified: Secondary | ICD-10-CM | POA: Diagnosis not present

## 2021-06-10 DIAGNOSIS — D631 Anemia in chronic kidney disease: Secondary | ICD-10-CM | POA: Diagnosis not present

## 2021-06-10 DIAGNOSIS — D509 Iron deficiency anemia, unspecified: Secondary | ICD-10-CM | POA: Diagnosis not present

## 2021-06-10 DIAGNOSIS — L299 Pruritus, unspecified: Secondary | ICD-10-CM | POA: Diagnosis not present

## 2021-06-13 DIAGNOSIS — T8189XA Other complications of procedures, not elsewhere classified, initial encounter: Secondary | ICD-10-CM | POA: Diagnosis not present

## 2021-06-14 DIAGNOSIS — Z992 Dependence on renal dialysis: Secondary | ICD-10-CM | POA: Diagnosis not present

## 2021-06-14 DIAGNOSIS — L299 Pruritus, unspecified: Secondary | ICD-10-CM | POA: Diagnosis not present

## 2021-06-14 DIAGNOSIS — D509 Iron deficiency anemia, unspecified: Secondary | ICD-10-CM | POA: Diagnosis not present

## 2021-06-14 DIAGNOSIS — D631 Anemia in chronic kidney disease: Secondary | ICD-10-CM | POA: Diagnosis not present

## 2021-06-14 DIAGNOSIS — N186 End stage renal disease: Secondary | ICD-10-CM | POA: Diagnosis not present

## 2021-06-14 DIAGNOSIS — D689 Coagulation defect, unspecified: Secondary | ICD-10-CM | POA: Diagnosis not present

## 2021-06-14 DIAGNOSIS — N2581 Secondary hyperparathyroidism of renal origin: Secondary | ICD-10-CM | POA: Diagnosis not present

## 2021-06-16 ENCOUNTER — Other Ambulatory Visit: Payer: Self-pay | Admitting: Internal Medicine

## 2021-06-16 ENCOUNTER — Other Ambulatory Visit (HOSPITAL_COMMUNITY): Payer: Self-pay

## 2021-06-16 DIAGNOSIS — B20 Human immunodeficiency virus [HIV] disease: Secondary | ICD-10-CM

## 2021-06-16 MED ORDER — BIKTARVY 50-200-25 MG PO TABS
1.0000 | ORAL_TABLET | Freq: Every day | ORAL | 0 refills | Status: DC
  Filled 2021-06-16 – 2021-06-24 (×2): qty 30, 30d supply, fill #0

## 2021-06-17 DIAGNOSIS — Z89612 Acquired absence of left leg above knee: Secondary | ICD-10-CM | POA: Diagnosis not present

## 2021-06-17 DIAGNOSIS — N2581 Secondary hyperparathyroidism of renal origin: Secondary | ICD-10-CM | POA: Diagnosis not present

## 2021-06-17 DIAGNOSIS — Z89511 Acquired absence of right leg below knee: Secondary | ICD-10-CM | POA: Diagnosis not present

## 2021-06-17 DIAGNOSIS — D631 Anemia in chronic kidney disease: Secondary | ICD-10-CM | POA: Diagnosis not present

## 2021-06-17 DIAGNOSIS — L299 Pruritus, unspecified: Secondary | ICD-10-CM | POA: Diagnosis not present

## 2021-06-17 DIAGNOSIS — D689 Coagulation defect, unspecified: Secondary | ICD-10-CM | POA: Diagnosis not present

## 2021-06-17 DIAGNOSIS — Z992 Dependence on renal dialysis: Secondary | ICD-10-CM | POA: Diagnosis not present

## 2021-06-17 DIAGNOSIS — N186 End stage renal disease: Secondary | ICD-10-CM | POA: Diagnosis not present

## 2021-06-17 DIAGNOSIS — D509 Iron deficiency anemia, unspecified: Secondary | ICD-10-CM | POA: Diagnosis not present

## 2021-06-17 DIAGNOSIS — E1169 Type 2 diabetes mellitus with other specified complication: Secondary | ICD-10-CM | POA: Diagnosis not present

## 2021-06-18 ENCOUNTER — Telehealth: Payer: Self-pay

## 2021-06-18 NOTE — Telephone Encounter (Signed)
Kathy Frank from [place] calls today to report that they have been unsuccessful in attempts to see patient. Last completed visit was 10/21. Patient unable to be contacted/does not open door. Will likely discharge from services next week.

## 2021-06-19 ENCOUNTER — Other Ambulatory Visit (HOSPITAL_COMMUNITY): Payer: Self-pay

## 2021-06-19 DIAGNOSIS — L299 Pruritus, unspecified: Secondary | ICD-10-CM | POA: Diagnosis not present

## 2021-06-19 DIAGNOSIS — Z992 Dependence on renal dialysis: Secondary | ICD-10-CM | POA: Diagnosis not present

## 2021-06-19 DIAGNOSIS — D689 Coagulation defect, unspecified: Secondary | ICD-10-CM | POA: Diagnosis not present

## 2021-06-19 DIAGNOSIS — N186 End stage renal disease: Secondary | ICD-10-CM | POA: Diagnosis not present

## 2021-06-19 DIAGNOSIS — N2581 Secondary hyperparathyroidism of renal origin: Secondary | ICD-10-CM | POA: Diagnosis not present

## 2021-06-19 DIAGNOSIS — D631 Anemia in chronic kidney disease: Secondary | ICD-10-CM | POA: Diagnosis not present

## 2021-06-19 DIAGNOSIS — D509 Iron deficiency anemia, unspecified: Secondary | ICD-10-CM | POA: Diagnosis not present

## 2021-06-22 ENCOUNTER — Telehealth: Payer: Self-pay | Admitting: Internal Medicine

## 2021-06-22 NOTE — Telephone Encounter (Signed)
Paged on-call provider for concerns of nausea, vomiting and significant burping.  States that her burping smells like "rotten eggs" and she was concerned that she might need antibiotics for this.  She endorses multiple episodes of nonbloody emesis, 3 episodes of diarrhea, generalized abdominal pain and lightheadedness that all started today.  States that she has not eaten anything out of the ordinary.  Ate chicken last night but her daughters ate the same things and are feeling fine.  Denies any fevers or blood in her stool.  Discussed that this may be a viral GI illness and that she likely does not need antibiotics.  Discussed symptoms to look out for.  Recommended that she stay hydrated.  Patient expressed understanding and all questions were answered.  Will have the clinic follow-up with her tomorrow to make sure her symptoms are improving.   Harlow Ohms, DO PGY-3 IMTS

## 2021-06-23 ENCOUNTER — Other Ambulatory Visit (HOSPITAL_COMMUNITY): Payer: Self-pay

## 2021-06-24 ENCOUNTER — Other Ambulatory Visit (HOSPITAL_COMMUNITY): Payer: Self-pay

## 2021-06-24 ENCOUNTER — Telehealth: Payer: Self-pay | Admitting: Student

## 2021-06-24 DIAGNOSIS — D689 Coagulation defect, unspecified: Secondary | ICD-10-CM | POA: Diagnosis not present

## 2021-06-24 DIAGNOSIS — N186 End stage renal disease: Secondary | ICD-10-CM | POA: Diagnosis not present

## 2021-06-24 DIAGNOSIS — L299 Pruritus, unspecified: Secondary | ICD-10-CM | POA: Diagnosis not present

## 2021-06-24 DIAGNOSIS — D631 Anemia in chronic kidney disease: Secondary | ICD-10-CM | POA: Diagnosis not present

## 2021-06-24 DIAGNOSIS — D509 Iron deficiency anemia, unspecified: Secondary | ICD-10-CM | POA: Diagnosis not present

## 2021-06-24 DIAGNOSIS — Z992 Dependence on renal dialysis: Secondary | ICD-10-CM | POA: Diagnosis not present

## 2021-06-24 DIAGNOSIS — N2581 Secondary hyperparathyroidism of renal origin: Secondary | ICD-10-CM | POA: Diagnosis not present

## 2021-06-24 NOTE — Telephone Encounter (Signed)
Pt is at dialysis today and is unable to come in. Pt states she has the worse headache and she can't get rid of it.  Pt also reporting her eyes are very sensitive to light and wants to know what to do.  Please call the patient back.

## 2021-06-24 NOTE — Telephone Encounter (Signed)
RTC, VM box has not been set up and RN unable to leave message. SChaplin, RN,BSN

## 2021-06-25 ENCOUNTER — Other Ambulatory Visit: Payer: Self-pay

## 2021-06-25 ENCOUNTER — Ambulatory Visit (INDEPENDENT_AMBULATORY_CARE_PROVIDER_SITE_OTHER): Payer: Medicare Other | Admitting: Student

## 2021-06-25 ENCOUNTER — Other Ambulatory Visit (HOSPITAL_COMMUNITY): Payer: Self-pay

## 2021-06-25 DIAGNOSIS — G4440 Drug-induced headache, not elsewhere classified, not intractable: Secondary | ICD-10-CM | POA: Diagnosis not present

## 2021-06-25 DIAGNOSIS — T3995XA Adverse effect of unspecified nonopioid analgesic, antipyretic and antirheumatic, initial encounter: Secondary | ICD-10-CM

## 2021-06-25 NOTE — Telephone Encounter (Signed)
Call from patient regarding Migraine HA.  Willing to do a telehealth appointment on today with Dr. Collene Gobble at 3:45 PM.

## 2021-06-25 NOTE — Progress Notes (Signed)
  [facility] Internal Medicine Residency Telephone Encounter Continuity Care Appointment  HPI:  This telephone encounter was created for [name] on 06/25/2021 for the following purpose/cc headaches.   Past Medical History:  Past Medical History:  Diagnosis Date   Anal abscess    chronic   Anxiety    CAD (coronary artery disease)    CVA (cerebral vascular accident) ([place])    Depression 06/28/2006   Qualifier: Diagnosis of  By: Riccardo Dubin MD, Todd     Diabetes mellitus type 2 in obese ([facility]) 06/28/1994   Dyspnea    uses oxygeb 2 liters per minute at dialysis   End stage renal disease on dialysis [facility])    on hemodialysis T/Th/Sat   Erosive esophagitis    Esophageal reflux    Eye redness    Gastroparesis    ? diabetic   GERD (gastroesophageal reflux disease)    History of blood transfusion 2019   Human immunodeficiency virus (HIV) disease ([place]) 04/23/2016   Hyperlipidemia    Hypertension    Metabolic bone disease 70/62/3762   Moderate nonproliferative diabetic retinopathy of both eyes ([place]) 11/21/2014   11/14/14: Noted on retinal imaging; needs follow-up imaging in 6 months  05/22/16: Noted on retinal imaging again; needs follow-up imaging in 6 months   PAD (peripheral artery disease) ([place])    Type 2 diabetes mellitus with diabetic peripheral angiopathy without gangrene ([place]) 05/01/2019   Wears dentures    lower   Wound infection s/p L transmetatarsal amputation      ROS:  As per HPI   Assessment / Plan / Recommendations:  Please see A&P under problem oriented charting for assessment of the patient's acute and chronic medical conditions.  As always, pt is advised that if symptoms worsen or new symptoms arise, they should go to an urgent care facility or to to ER for further evaluation.   Consent and Medical Decision Making:  Patient discussed with Dr. Angelia Mould This is a telephone encounter between Kathy Frank and Sanjuan Dame on 06/25/2021 for  headaches. The visit was conducted with the patient located at home and Sanjuan Dame at [facility]. The patient's identity was confirmed using their DOB and current address. The patient has consented to being evaluated through a telephone encounter and understands the associated risks (an examination cannot be done and the patient may need to come in for an appointment) / benefits (allows the patient to remain at home, decreasing exposure to coronavirus). I personally spent 9 minutes on medical discussion.

## 2021-06-26 DIAGNOSIS — D689 Coagulation defect, unspecified: Secondary | ICD-10-CM | POA: Diagnosis not present

## 2021-06-26 DIAGNOSIS — L299 Pruritus, unspecified: Secondary | ICD-10-CM | POA: Diagnosis not present

## 2021-06-26 DIAGNOSIS — N2581 Secondary hyperparathyroidism of renal origin: Secondary | ICD-10-CM | POA: Diagnosis not present

## 2021-06-26 DIAGNOSIS — D509 Iron deficiency anemia, unspecified: Secondary | ICD-10-CM | POA: Diagnosis not present

## 2021-06-26 DIAGNOSIS — N186 End stage renal disease: Secondary | ICD-10-CM | POA: Diagnosis not present

## 2021-06-26 DIAGNOSIS — Z992 Dependence on renal dialysis: Secondary | ICD-10-CM | POA: Diagnosis not present

## 2021-06-26 DIAGNOSIS — D631 Anemia in chronic kidney disease: Secondary | ICD-10-CM | POA: Diagnosis not present

## 2021-06-28 DIAGNOSIS — G4440 Drug-induced headache, not elsewhere classified, not intractable: Secondary | ICD-10-CM

## 2021-06-28 DIAGNOSIS — N186 End stage renal disease: Secondary | ICD-10-CM | POA: Diagnosis not present

## 2021-06-28 DIAGNOSIS — D689 Coagulation defect, unspecified: Secondary | ICD-10-CM | POA: Diagnosis not present

## 2021-06-28 DIAGNOSIS — N2581 Secondary hyperparathyroidism of renal origin: Secondary | ICD-10-CM | POA: Diagnosis not present

## 2021-06-28 DIAGNOSIS — D631 Anemia in chronic kidney disease: Secondary | ICD-10-CM | POA: Diagnosis not present

## 2021-06-28 DIAGNOSIS — D509 Iron deficiency anemia, unspecified: Secondary | ICD-10-CM | POA: Diagnosis not present

## 2021-06-28 DIAGNOSIS — Z992 Dependence on renal dialysis: Secondary | ICD-10-CM | POA: Diagnosis not present

## 2021-06-28 DIAGNOSIS — L299 Pruritus, unspecified: Secondary | ICD-10-CM | POA: Diagnosis not present

## 2021-06-28 HISTORY — DX: Drug-induced headache, not elsewhere classified, not intractable: G44.40

## 2021-06-28 NOTE — Assessment & Plan Note (Signed)
Patient is presenting via telephone today to discuss recent headaches. Notes she has been having these headaches for the past two days. Describes them as sharp and located only on the right side of her head. Notes these occur for roughly 71min-1 hour and occur multiple times daily. She did go to dialysis yesterday, when she was told her blood pressure was extremely high. Her headache was not relieved after dialysis session. Denies aura, blurry vision, radiation of pain, fevers, chills, focal weakness/parasthesias, history of cancer. She mentions taking ibuprofen 400mg  consistently two-three times daily over the last few days.   Discussed with Ms. Lisenby that her headaches could be caused by overuse of this medication. Told Ms. Wrenn that she should hold off taking any NSAID medication over the next few days and come to clinic in-person if symptoms do not resolve.  - Hold all NSAID medications - Return to clinic if symptoms do not improve

## 2021-06-30 ENCOUNTER — Ambulatory Visit: Payer: Medicare Other | Admitting: Cardiology

## 2021-07-01 DIAGNOSIS — D689 Coagulation defect, unspecified: Secondary | ICD-10-CM | POA: Diagnosis not present

## 2021-07-01 DIAGNOSIS — D631 Anemia in chronic kidney disease: Secondary | ICD-10-CM | POA: Diagnosis not present

## 2021-07-01 DIAGNOSIS — N2581 Secondary hyperparathyroidism of renal origin: Secondary | ICD-10-CM | POA: Diagnosis not present

## 2021-07-01 DIAGNOSIS — Z992 Dependence on renal dialysis: Secondary | ICD-10-CM | POA: Diagnosis not present

## 2021-07-01 DIAGNOSIS — D509 Iron deficiency anemia, unspecified: Secondary | ICD-10-CM | POA: Diagnosis not present

## 2021-07-01 DIAGNOSIS — N186 End stage renal disease: Secondary | ICD-10-CM | POA: Diagnosis not present

## 2021-07-01 DIAGNOSIS — L299 Pruritus, unspecified: Secondary | ICD-10-CM | POA: Diagnosis not present

## 2021-07-01 NOTE — Progress Notes (Signed)
Internal Medicine Clinic Attending ? ?Case discussed with Dr. Braswell  At the time of the visit.  We reviewed the resident?s history and exam and pertinent patient test results.  I agree with the assessment, diagnosis, and plan of care documented in the resident?s note.  ?

## 2021-07-02 IMAGING — CR DG CHEST 1V
1 series · 1 of 1 positions shown · non-contrast
Comparison: Radiograph 06/17/2020

CLINICAL DATA: Questionable sepsis - evaluate for abnormality

Patient reports right foot pain.
EXAM:
CHEST  1 VIEW

[chest ap]
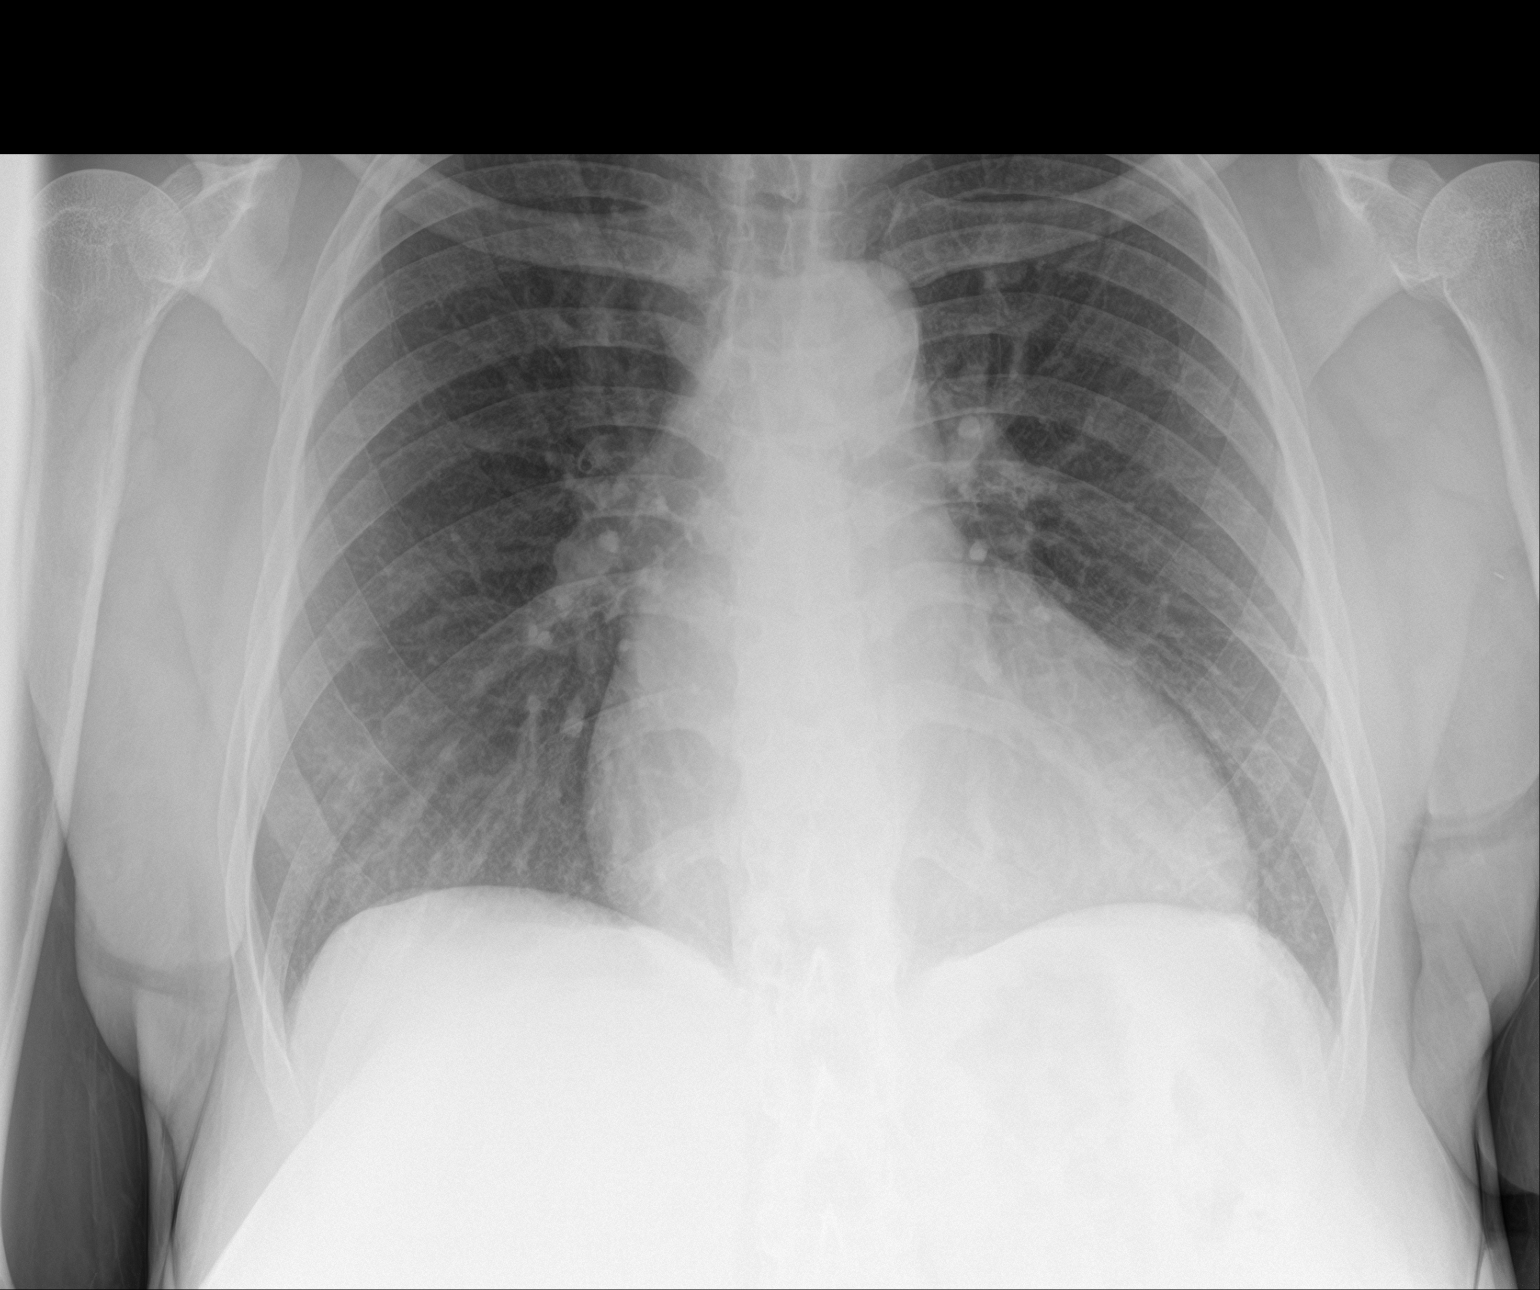

[1 of 1 positions shown; findings below may reference images not displayed]

FINDINGS: Stable cardiomegaly. Unchanged mediastinal contours. Aortic
atherosclerosis. No focal airspace disease, pulmonary edema, pleural
effusion or pneumothorax. No acute osseous abnormalities are seen.
IMPRESSION: Stable cardiomegaly. No acute chest findings.

## 2021-07-06 DIAGNOSIS — D689 Coagulation defect, unspecified: Secondary | ICD-10-CM | POA: Diagnosis not present

## 2021-07-06 DIAGNOSIS — Z992 Dependence on renal dialysis: Secondary | ICD-10-CM | POA: Diagnosis not present

## 2021-07-06 DIAGNOSIS — N186 End stage renal disease: Secondary | ICD-10-CM | POA: Diagnosis not present

## 2021-07-06 DIAGNOSIS — L299 Pruritus, unspecified: Secondary | ICD-10-CM | POA: Diagnosis not present

## 2021-07-06 DIAGNOSIS — D509 Iron deficiency anemia, unspecified: Secondary | ICD-10-CM | POA: Diagnosis not present

## 2021-07-06 DIAGNOSIS — N2581 Secondary hyperparathyroidism of renal origin: Secondary | ICD-10-CM | POA: Diagnosis not present

## 2021-07-06 DIAGNOSIS — D631 Anemia in chronic kidney disease: Secondary | ICD-10-CM | POA: Diagnosis not present

## 2021-07-09 ENCOUNTER — Other Ambulatory Visit: Payer: Self-pay

## 2021-07-09 ENCOUNTER — Inpatient Hospital Stay (HOSPITAL_COMMUNITY): Admission: RE | Admit: 2021-07-09 | Payer: Medicare Other | Source: Ambulatory Visit

## 2021-07-09 ENCOUNTER — Encounter (HOSPITAL_COMMUNITY): Payer: Self-pay | Admitting: Emergency Medicine

## 2021-07-09 ENCOUNTER — Ambulatory Visit: Payer: Medicare Other

## 2021-07-09 ENCOUNTER — Emergency Department (HOSPITAL_COMMUNITY)
Admission: EM | Admit: 2021-07-09 | Discharge: 2021-07-10 | Disposition: A | Discharge status: Home / Self Care | Payer: Medicare Other | Attending: Emergency Medicine | Admitting: Emergency Medicine

## 2021-07-09 ENCOUNTER — Telehealth: Payer: Self-pay | Admitting: *Deleted

## 2021-07-09 ENCOUNTER — Encounter (HOSPITAL_COMMUNITY): Payer: Medicare Other

## 2021-07-09 DIAGNOSIS — Z20822 Contact with and (suspected) exposure to covid-19: Secondary | ICD-10-CM | POA: Diagnosis not present

## 2021-07-09 DIAGNOSIS — R9431 Abnormal electrocardiogram [ECG] [EKG]: Secondary | ICD-10-CM | POA: Insufficient documentation

## 2021-07-09 DIAGNOSIS — Z87891 Personal history of nicotine dependence: Secondary | ICD-10-CM | POA: Diagnosis not present

## 2021-07-09 DIAGNOSIS — I517 Cardiomegaly: Secondary | ICD-10-CM | POA: Diagnosis not present

## 2021-07-09 DIAGNOSIS — Z7982 Long term (current) use of aspirin: Secondary | ICD-10-CM | POA: Insufficient documentation

## 2021-07-09 DIAGNOSIS — I120 Hypertensive chronic kidney disease with stage 5 chronic kidney disease or end stage renal disease: Secondary | ICD-10-CM | POA: Insufficient documentation

## 2021-07-09 DIAGNOSIS — Z794 Long term (current) use of insulin: Secondary | ICD-10-CM | POA: Insufficient documentation

## 2021-07-09 DIAGNOSIS — E875 Hyperkalemia: Secondary | ICD-10-CM | POA: Diagnosis not present

## 2021-07-09 DIAGNOSIS — Z79899 Other long term (current) drug therapy: Secondary | ICD-10-CM | POA: Insufficient documentation

## 2021-07-09 DIAGNOSIS — R079 Chest pain, unspecified: Secondary | ICD-10-CM | POA: Diagnosis not present

## 2021-07-09 DIAGNOSIS — Z7902 Long term (current) use of antithrombotics/antiplatelets: Secondary | ICD-10-CM | POA: Diagnosis not present

## 2021-07-09 DIAGNOSIS — R519 Headache, unspecified: Secondary | ICD-10-CM | POA: Diagnosis not present

## 2021-07-09 DIAGNOSIS — Z992 Dependence on renal dialysis: Secondary | ICD-10-CM | POA: Diagnosis not present

## 2021-07-09 DIAGNOSIS — N9489 Other specified conditions associated with female genital organs and menstrual cycle: Secondary | ICD-10-CM | POA: Diagnosis not present

## 2021-07-09 DIAGNOSIS — I2510 Atherosclerotic heart disease of native coronary artery without angina pectoris: Secondary | ICD-10-CM | POA: Insufficient documentation

## 2021-07-09 DIAGNOSIS — Z21 Asymptomatic human immunodeficiency virus [HIV] infection status: Secondary | ICD-10-CM | POA: Diagnosis not present

## 2021-07-09 DIAGNOSIS — Z9115 Patient's noncompliance with renal dialysis: Secondary | ICD-10-CM | POA: Diagnosis not present

## 2021-07-09 DIAGNOSIS — R0602 Shortness of breath: Secondary | ICD-10-CM | POA: Diagnosis present

## 2021-07-09 DIAGNOSIS — E1122 Type 2 diabetes mellitus with diabetic chronic kidney disease: Secondary | ICD-10-CM | POA: Insufficient documentation

## 2021-07-09 DIAGNOSIS — E1129 Type 2 diabetes mellitus with other diabetic kidney complication: Secondary | ICD-10-CM | POA: Diagnosis not present

## 2021-07-09 DIAGNOSIS — I700 Atherosclerosis of aorta: Secondary | ICD-10-CM | POA: Diagnosis not present

## 2021-07-09 DIAGNOSIS — N186 End stage renal disease: Secondary | ICD-10-CM | POA: Diagnosis not present

## 2021-07-09 NOTE — Telephone Encounter (Signed)
Call from patient stating that she is having problems breathing. Missed Dialysis appointment on yesterday.  Patient was advised to go to the ER as soon as possible. And to call an Ambulance.  Patient agreed and is going to go to the ER for follow up.  Sander Nephew, RN 07/09/2021  2:26 PM

## 2021-07-09 NOTE — ED Triage Notes (Signed)
Pt here w/ HA, shob, cp and cough that all started today.

## 2021-07-09 NOTE — ED Provider Notes (Signed)
Emergency Medicine Provider Triage Evaluation Note  Kathy Frank , a 45 y.o. female  was evaluated in triage.  Pt complains of dyspnea and headache. Dyspnea is constant with associated chest pressure/tightness also having right sided headache. No alleviating/aggravating factors to sxs. States she overall feels very poorly   Review of Systems  Positive: Dyspnea, chest tightness, headache Negative: Vomiting, numbness, weakness, fever  Physical Exam  BP (!) 189/150   Pulse 97   Temp 98.3 F (36.8 C) (Oral)   Resp (!) 26   SpO2 100%  Gen:   Awake, no distress   Resp:  Mild tachypnea, not in respiratory distress.  MSK:   Moves extremities without difficulty, LLE AKA.  Other:  PERRL, EOMI, no facial droop.   Medical Decision Making  Medically screening exam initiated at 11:44 PM.  Appropriate orders placed.  [name] was informed that the remainder of the evaluation will be completed by another provider, this initial triage assessment does not replace that evaluation, and the importance of remaining in the ED until their evaluation is complete.  Hypertensive, hx of similar on chart review, repeat BP 180s/90s.  Headache, dyspnea.    Leafy Kindle 07/09/21 [id number]    Quintella Reichert, MD 07/10/21 [phone]

## 2021-07-10 ENCOUNTER — Emergency Department (HOSPITAL_COMMUNITY): Payer: Medicare Other

## 2021-07-10 DIAGNOSIS — R079 Chest pain, unspecified: Secondary | ICD-10-CM | POA: Diagnosis not present

## 2021-07-10 DIAGNOSIS — E1122 Type 2 diabetes mellitus with diabetic chronic kidney disease: Secondary | ICD-10-CM | POA: Diagnosis not present

## 2021-07-10 DIAGNOSIS — R519 Headache, unspecified: Secondary | ICD-10-CM | POA: Diagnosis not present

## 2021-07-10 DIAGNOSIS — I517 Cardiomegaly: Secondary | ICD-10-CM | POA: Diagnosis not present

## 2021-07-10 DIAGNOSIS — I700 Atherosclerosis of aorta: Secondary | ICD-10-CM | POA: Diagnosis not present

## 2021-07-10 LAB — CBC WITH DIFFERENTIAL/PLATELET
Abs Immature Granulocytes: 0.01 10*3/uL (ref 0.00–0.07)
Abs Immature Granulocytes: 0.02 10*3/uL (ref 0.00–0.07)
Basophils Absolute: 0.1 10*3/uL (ref 0.0–0.1)
Basophils Absolute: 0.1 10*3/uL (ref 0.0–0.1)
Basophils Relative: 2 %
Basophils Relative: 2 %
Eosinophils Absolute: 0.3 10*3/uL (ref 0.0–0.5)
Eosinophils Absolute: 0.3 10*3/uL (ref 0.0–0.5)
Eosinophils Relative: 5 %
Eosinophils Relative: 5 %
HCT: 24.6 % — ABNORMAL LOW (ref 36.0–46.0)
HCT: 28.2 % — ABNORMAL LOW (ref 36.0–46.0)
Hemoglobin: 7.9 g/dL — ABNORMAL LOW (ref 12.0–15.0)
Hemoglobin: 9 g/dL — ABNORMAL LOW (ref 12.0–15.0)
Immature Granulocytes: 0 %
Immature Granulocytes: 0 %
Lymphocytes Relative: 24 %
Lymphocytes Relative: 35 %
Lymphs Abs: 1.4 10*3/uL (ref 0.7–4.0)
Lymphs Abs: 1.9 10*3/uL (ref 0.7–4.0)
MCH: 30 pg (ref 26.0–34.0)
MCH: 30.3 pg (ref 26.0–34.0)
MCHC: 31.9 g/dL (ref 30.0–36.0)
MCHC: 32.1 g/dL (ref 30.0–36.0)
MCV: 93.5 fL (ref 80.0–100.0)
MCV: 94.9 fL (ref 80.0–100.0)
Monocytes Absolute: 0.3 10*3/uL (ref 0.1–1.0)
Monocytes Absolute: 0.4 10*3/uL (ref 0.1–1.0)
Monocytes Relative: 5 %
Monocytes Relative: 7 %
Neutro Abs: 2.9 10*3/uL (ref 1.7–7.7)
Neutro Abs: 3.8 10*3/uL (ref 1.7–7.7)
Neutrophils Relative %: 51 %
Neutrophils Relative %: 64 %
Platelets: 184 10*3/uL (ref 150–400)
Platelets: 188 10*3/uL (ref 150–400)
RBC: 2.63 MIL/uL — ABNORMAL LOW (ref 3.87–5.11)
RBC: 2.97 MIL/uL — ABNORMAL LOW (ref 3.87–5.11)
RDW: 18.2 % — ABNORMAL HIGH (ref 11.5–15.5)
RDW: 18.2 % — ABNORMAL HIGH (ref 11.5–15.5)
WBC: 5.5 10*3/uL (ref 4.0–10.5)
WBC: 5.9 10*3/uL (ref 4.0–10.5)
nRBC: 0 % (ref 0.0–0.2)
nRBC: 0 % (ref 0.0–0.2)

## 2021-07-10 LAB — RENAL FUNCTION PANEL
Albumin: 3.1 g/dL — ABNORMAL LOW (ref 3.5–5.0)
Anion gap: 24 — ABNORMAL HIGH (ref 5–15)
BUN: 99 mg/dL — ABNORMAL HIGH (ref 6–20)
CO2: 15 mmol/L — ABNORMAL LOW (ref 22–32)
Calcium: 6.5 mg/dL — ABNORMAL LOW (ref 8.9–10.3)
Chloride: 103 mmol/L (ref 98–111)
Creatinine, Ser: 12.12 mg/dL — ABNORMAL HIGH (ref 0.44–1.00)
GFR, Estimated: 4 mL/min — ABNORMAL LOW (ref >60)
Glucose, Bld: 111 mg/dL — ABNORMAL HIGH (ref 70–99)
Phosphorus: 10.4 mg/dL — ABNORMAL HIGH (ref 2.5–4.6)
Potassium: 5.3 mmol/L — ABNORMAL HIGH (ref 3.5–5.1)
Sodium: 142 mmol/L (ref 135–145)

## 2021-07-10 LAB — COMPREHENSIVE METABOLIC PANEL
ALT: 8 U/L (ref 0–44)
AST: 10 U/L — ABNORMAL LOW (ref 15–41)
Albumin: 3.4 g/dL — ABNORMAL LOW (ref 3.5–5.0)
Alkaline Phosphatase: 65 U/L (ref 38–126)
Anion gap: 22 — ABNORMAL HIGH (ref 5–15)
BUN: 94 mg/dL — ABNORMAL HIGH (ref 6–20)
CO2: 14 mmol/L — ABNORMAL LOW (ref 22–32)
Calcium: 6.4 mg/dL — CL (ref 8.9–10.3)
Chloride: 105 mmol/L (ref 98–111)
Creatinine, Ser: 11.86 mg/dL — ABNORMAL HIGH (ref 0.44–1.00)
GFR, Estimated: 4 mL/min — ABNORMAL LOW (ref >60)
Glucose, Bld: 103 mg/dL — ABNORMAL HIGH (ref 70–99)
Potassium: 7.2 mmol/L (ref 3.5–5.1)
Sodium: 141 mmol/L (ref 135–145)
Total Bilirubin: 0.8 mg/dL (ref 0.3–1.2)
Total Protein: 8.2 g/dL — ABNORMAL HIGH (ref 6.5–8.1)

## 2021-07-10 LAB — CBG MONITORING, ED
Glucose-Capillary: 104 mg/dL — ABNORMAL HIGH (ref 70–99)
Glucose-Capillary: 154 mg/dL — ABNORMAL HIGH (ref 70–99)
Glucose-Capillary: 208 mg/dL — ABNORMAL HIGH (ref 70–99)

## 2021-07-10 LAB — HEPATITIS B SURFACE ANTIBODY,QUALITATIVE: Hep B S Ab: REACTIVE — AB

## 2021-07-10 LAB — RESP PANEL BY RT-PCR (FLU A&B, COVID) ARPGX2
Influenza A by PCR: NEGATIVE
Influenza B by PCR: NEGATIVE
SARS Coronavirus 2 by RT PCR: NEGATIVE

## 2021-07-10 LAB — I-STAT BETA HCG BLOOD, ED (MC, WL, AP ONLY): I-stat hCG, quantitative: <5 m[IU]/mL (ref <5)

## 2021-07-10 LAB — POTASSIUM: Potassium: 3.3 mmol/L — ABNORMAL LOW (ref 3.5–5.1)

## 2021-07-10 LAB — HEPATITIS B SURFACE ANTIGEN: Hepatitis B Surface Ag: NONREACTIVE

## 2021-07-10 LAB — TROPONIN I (HIGH SENSITIVITY)
Troponin I (High Sensitivity): 57 ng/L — ABNORMAL HIGH (ref <18)
Troponin I (High Sensitivity): 57 ng/L — ABNORMAL HIGH (ref <18)

## 2021-07-10 MED ORDER — CALCIUM GLUCONATE 10 % IV SOLN
1.0000 g | Freq: Once | INTRAVENOUS | Status: AC
  Administered 2021-07-10: 1 g via INTRAVENOUS
  Filled 2021-07-10: qty 10

## 2021-07-10 MED ORDER — DEXTROSE 50 % IV SOLN
1.0000 | Freq: Once | INTRAVENOUS | Status: AC
  Administered 2021-07-10: 50 mL via INTRAVENOUS
  Filled 2021-07-10: qty 50

## 2021-07-10 MED ORDER — SODIUM CHLORIDE 0.9 % IV SOLN: 100.0000 mL | INTRAVENOUS | Status: DC | PRN

## 2021-07-10 MED ORDER — SODIUM BICARBONATE 8.4 % IV SOLN
50.0000 meq | Freq: Once | INTRAVENOUS | Status: AC
  Administered 2021-07-10: 50 meq via INTRAVENOUS
  Filled 2021-07-10: qty 50

## 2021-07-10 MED ORDER — INSULIN ASPART 100 UNIT/ML IV SOLN
5.0000 [IU] | Freq: Once | INTRAVENOUS | Status: AC
  Administered 2021-07-10: 5 [IU] via INTRAVENOUS

## 2021-07-10 MED ORDER — LIDOCAINE HCL (PF) 1 % IJ SOLN: 5.0000 mL | INTRAMUSCULAR | Status: DC | PRN

## 2021-07-10 MED ORDER — CHLORHEXIDINE GLUCONATE CLOTH 2 % EX PADS: 6.0000 | MEDICATED_PAD | Freq: Every day | CUTANEOUS | Status: DC

## 2021-07-10 MED ORDER — ALBUTEROL SULFATE (2.5 MG/3ML) 0.083% IN NEBU
10.0000 mg | INHALATION_SOLUTION | Freq: Once | RESPIRATORY_TRACT | Status: AC
  Administered 2021-07-10: 10 mg via RESPIRATORY_TRACT
  Filled 2021-07-10 (×2): qty 12

## 2021-07-10 MED ORDER — PENTAFLUOROPROP-TETRAFLUOROETH EX AERO: 1.0000 "application " | INHALATION_SPRAY | CUTANEOUS | Status: DC | PRN

## 2021-07-10 MED ORDER — HYDRALAZINE HCL 25 MG PO TABS
50.0000 mg | ORAL_TABLET | Freq: Once | ORAL | Status: AC
  Administered 2021-07-10: 50 mg via ORAL
  Filled 2021-07-10: qty 2

## 2021-07-10 MED ORDER — ALTEPLASE 2 MG IJ SOLR: 2.0000 mg | Freq: Once | INTRAMUSCULAR | Status: DC | PRN

## 2021-07-10 MED ORDER — HEPARIN SODIUM (PORCINE) 1000 UNIT/ML DIALYSIS: 1000.0000 [IU] | INTRAMUSCULAR | Status: DC | PRN

## 2021-07-10 MED ORDER — HYDROMORPHONE HCL 1 MG/ML IJ SOLN
0.5000 mg | Freq: Once | INTRAMUSCULAR | Status: AC
  Administered 2021-07-10: 0.5 mg via INTRAVENOUS
  Filled 2021-07-10: qty 0.5

## 2021-07-10 MED ORDER — LIDOCAINE-PRILOCAINE 2.5-2.5 % EX CREA: 1.0000 "application " | TOPICAL_CREAM | CUTANEOUS | Status: DC | PRN

## 2021-07-10 MED ORDER — SODIUM ZIRCONIUM CYCLOSILICATE 10 G PO PACK: 10.0000 g | PACK | Freq: Once | ORAL | Status: DC

## 2021-07-10 MED ORDER — DEXTROSE 10 % IV SOLN: Freq: Once | INTRAVENOUS | Status: AC

## 2021-07-10 NOTE — Discharge Instructions (Signed)
You have been seen and discharged from the emergency department.  You received dialysis, your potassium level decreased.  Follow-up with your primary provider for reevaluation and further care. Take home medications as prescribed. If you have any worsening symptoms or further concerns for your health please return to an emergency department for further evaluation.

## 2021-07-10 NOTE — ED Notes (Signed)
Unable to obtain signature at time of discharge as computer in room is not working and there is no signature pad on [facility] computer station. Pt verbalized understanding of discharge instructions and is calling her daughter to pick her up.

## 2021-07-10 NOTE — Progress Notes (Signed)
50F with ESRD on HD and other medical conditions rev'd in chart who presented to the ED today with malaise and dyspnea in the setting of missed dialysis.   Labs have revealed K 7.2, Bicarb 14, Corr ca 6.8 and EKG is showing peaked T waves and prolonged QTc.  She has a patent AVF for dialysis access.  She is on RA and hypertensive.    Her potassium will be medically managed in the ED with plans for hemodialysis overnight as soon as her COVID test is complete.  Hemodialysis RN aware and will perform her dialysis treatment ASAP.  Plan is for her to return to the ED after dialysis for reassessment.    Call with concerns.   Jannifer Hick MD [facility] Pager [phone]

## 2021-07-10 NOTE — ED Provider Notes (Signed)
[facility] EMERGENCY DEPARTMENT Provider Note   CSN: [id number] Arrival date & time: 07/09/21  2322     History Chief Complaint  Patient presents with   Headache   Shortness of Breath    JAMIA HOBAN is a 45 y.o. female.  The history is provided by the patient and medical records.  Headache Shortness of Breath Associated symptoms: headaches   YAILEN ZEMAITIS is a 45 y.o. female who presents to the Emergency Department complaining of shortness of breath. She presents to the emergency department for evaluation of difficulty breathing and feeling unwell that started today. She has a history of ESR D and dialysis Tuesday, Thursday, Saturday. Her last dialysis session was on Sunday - adjusted due to the holiday. She missed her dialysis session on Tuesday due to over sleeping in the clinic being unable to give her new time slot. Today she developed malaise and feeling unwell with shortness of breath and central chest discomfort. She was concerned that she was having another heart attack. No fevers, nausea, vomiting, diarrhea. She does still make some urine. She dialysis at [facility]. She is compliant with her home medications.    Past Medical History:  Diagnosis Date   Anal abscess    chronic   Anxiety    CAD (coronary artery disease)    CVA (cerebral vascular accident) ([place])    Depression 06/28/2006   Qualifier: Diagnosis of  By: Riccardo Dubin MD, Todd     Diabetes mellitus type 2 in obese ([place]) 06/28/1994   Dyspnea    uses oxygeb 2 liters per minute at dialysis   End stage renal disease on dialysis [facility])    on hemodialysis T/Th/Sat   Erosive esophagitis    Esophageal reflux    Eye redness    Gastroparesis    ? diabetic   GERD (gastroesophageal reflux disease)    History of blood transfusion 2019   Human immunodeficiency virus (HIV) disease ([place]) 04/23/2016   Hyperlipidemia    Hypertension    Metabolic bone disease 08/18/3233   Moderate  nonproliferative diabetic retinopathy of both eyes ([place]) 11/21/2014   11/14/14: Noted on retinal imaging; needs follow-up imaging in 6 months  05/22/16: Noted on retinal imaging again; needs follow-up imaging in 6 months   PAD (peripheral artery disease) ([place])    Type 2 diabetes mellitus with diabetic peripheral angiopathy without gangrene ([place]) 05/01/2019   Wears dentures    lower   Wound infection s/p L transmetatarsal amputation     Patient Active Problem List   Diagnosis Date Noted   Analgesic overuse headache 06/28/2021   Urinary and fecal incontinence 05/30/2021   Epistaxis 05/27/2021   Status post coronary artery stent placement    Coronary artery disease involving native coronary artery of native heart with unstable angina pectoris ([place])    Hypertensive urgency 05/20/2021   History of non-ST elevation myocardial infarction (NSTEMI) 05/20/2021   BKA stump complication ([place]) 57/32/2025   Right below-knee amputee ([place]) 02/08/2021   Peripheral arterial disease ([place]) 01/29/2021   Anxiety 05/28/2020   Patient's other noncompliance with medication regimen 12/28/2019   Major depressive disorder, single episode, unspecified 04/24/2019   Secondary hyperparathyroidism of renal origin ([place]) 04/24/2019   S/P AKA (above knee amputation), left ([place]) 12/16/2018   Anemia due to chronic kidney disease    Moderate protein-calorie malnutrition (HCC)    Adjustment disorder with mixed anxiety and depressed mood 08/15/2018   Wound dehiscence    ESRD (end stage renal  disease) ([place])    Fecal incontinence 02/04/2018   Aortic atherosclerosis ([place]) 09/30/2016   Human immunodeficiency virus (HIV) disease ([place]) 04/23/2016   Gastroparesis    Moderate nonproliferative diabetic retinopathy of both eyes ([place]) 11/21/2014   Esophageal reflux    Hypertension 11/16/2013   Depression 06/28/2006   Diabetes mellitus type 2 in obese ([place]) 06/28/1994    Past Surgical History:  Procedure Laterality Date    ABDOMINAL AORTOGRAM W/LOWER EXTREMITY N/A 12/13/2020   Procedure: ABDOMINAL AORTOGRAM W/LOWER EXTREMITY;  Surgeon: Angelia Mould, MD;  Location: [place];  Service: Cardiovascular;  Laterality: N/A;   AMPUTATION Left 07/08/2018   Procedure: AMPUTATION FORTH RAY LEFT FOOT;  Surgeon: [name], MD;  Location: [place];  Service: Orthopedics;  Laterality: Left;   AMPUTATION Left 08/09/2018   Procedure: Left Transmetatarsal Amputation;  Surgeon: [name], MD;  Location: [place];  Service: Orthopedics;  Laterality: Left;   AMPUTATION Left 10/08/2018   Procedure: LEFT BELOW KNEE AMPUTATION;  Surgeon: [name], MD;  Location: [place];  Service: Orthopedics;  Laterality: Left;   AMPUTATION Left 10/28/2018   Procedure: REVISION BELOW KNEE AMPUTATION;  Surgeon: [name], MD;  Location: [place];  Service: Orthopedics;  Laterality: Left;   AMPUTATION Left 12/16/2018   Procedure: LEFT ABOVE KNEE AMPUTATION;  Surgeon: [name], MD;  Location: [place];  Service: Orthopedics;  Laterality: Left;   AMPUTATION Right 01/31/2021   Procedure: AMPUTATION BELOW KNEE;  Surgeon: Serafina Mitchell, MD;  Location: [facility] OR;  Service: Vascular;  Laterality: Right;   AMPUTATION Right 03/28/2021   Procedure: REVISION OF RIGHT BELOW KNEE AMPUTATION;  Surgeon: Serafina Mitchell, MD;  Location: [place];  Service: Vascular;  Laterality: Right;   AMPUTATION Right 04/18/2021   Procedure: right below knee amputation/washout placement wound vac;  Surgeon: Cherre Robins, MD;  Location: [place];  Service: Vascular;  Laterality: Right;   AV FISTULA PLACEMENT Left 10/14/2018   Procedure: Arteriovenous (Av) Fistula Creation Left Arm;  Surgeon: [name], MD;  Location: [place];  Service: Vascular;  Laterality: Left;   [place] Left 04/14/2019   Procedure: BASILIC VEIN TRANSPOSITION SECOND STAGE LEFT ARM;  Surgeon: Rosetta Posner, MD;  Location: [place];  Service: Vascular;  Laterality: Left;    CORONARY STENT INTERVENTION N/A 05/21/2021   Procedure: CORONARY STENT INTERVENTION;  Surgeon: Leonie Man, MD;  Location: [place] [facility];  Service: Cardiovascular;  Laterality: N/A;   INCISION AND DRAINAGE ABSCESS N/A 10/24/2020   Procedure: exicision of hydradenitis;  Surgeon: Leighton Ruff, MD;  Location: [place];  Service: General;  Laterality: N/A;  45 min   INSERTION OF DIALYSIS CATHETER Right 04/14/2019   Procedure: INSERTION OF DIALYSIS CATHETER;  Surgeon: Rosetta Posner, MD;  Location: [facility];  Service: Vascular;  Laterality: Right;   LEFT HEART CATH AND CORONARY ANGIOGRAPHY N/A 05/21/2021   Procedure: LEFT HEART CATH AND CORONARY ANGIOGRAPHY;  Surgeon: Leonie Man, MD;  Location: [place] [facility];  Service: Cardiovascular;  Laterality: N/A;   LOWER EXTREMITY ANGIOGRAPHY N/A 07/05/2018   Procedure: LOWER EXTREMITY ANGIOGRAPHY;  Surgeon: Serafina Mitchell, MD;  Location: [place] [facility];  Service: Cardiovascular;  Laterality: N/A;   PERIPHERAL VASCULAR ATHERECTOMY Right 12/13/2020   Procedure: PERIPHERAL VASCULAR ATHERECTOMY;  Surgeon: Angelia Mould, MD;  Location: [place] [facility];  Service: Cardiovascular;  Laterality: Right;  Superficial femoral   PERIPHERAL  VASCULAR BALLOON ANGIOPLASTY Left 07/05/2018   Procedure: PERIPHERAL VASCULAR BALLOON ANGIOPLASTY;  Surgeon: Serafina Mitchell, MD;  Location: [place] [facility];  Service: Cardiovascular;  Laterality: Left;  SFA   PERIPHERAL VASCULAR BALLOON ANGIOPLASTY Right 12/13/2020   Procedure: PERIPHERAL VASCULAR BALLOON ANGIOPLASTY;  Surgeon: Angelia Mould, MD;  Location: [place] [facility];  Service: Cardiovascular;  Laterality: Right;  Peroneal artery, anterior tibial artery   RECTAL EXAM UNDER ANESTHESIA N/A 10/24/2020   Procedure: EXAM UNDER ANESTHESIA;  Surgeon: Leighton Ruff, MD;  Location: [facility];  Service: General;  Laterality: N/A;   STUMP REVISION Left 10/19/2018    Procedure: REVISION LEFT BELOW KNEE AMPUTATION;  Surgeon: [name], MD;  Location: [place];  Service: Orthopedics;  Laterality: Left;   TUBAL LIGATION  2002     OB History   No obstetric history on file.     Family History  Problem Relation Age of Onset   Diabetes Mother    Diabetes Brother    Diabetes Daughter    Diabetes Daughter    Mental retardation Brother        died from PNA   Diabetes Maternal Grandmother     Social History   Tobacco Use   Smoking status: Former    Packs/day: 0.10    Years: 10.00    Pack years: 1.00    Types: Cigarettes    Start date: 11/17/2013    Quit date: 09/09/2014    Years since quitting: 6.8   Smokeless tobacco: Never  Vaping Use   Vaping Use: Never used  Substance Use Topics   Alcohol use: Not Currently   Drug use: Not Currently    Types: Marijuana    Comment: 2 times a monthy -  `last time [place] 2022    Home Medications Prior to Admission medications   Medication Sig Start Date End Date Taking? Authorizing Provider  Accu-Chek Softclix Lancets lancets Use as instructed 05/02/21  Yes Atway, Rayann N, DO  albuterol (VENTOLIN HFA) 108 (90 Base) MCG/ACT inhaler Inhale 2 puffs into the lungs every 4 (four) hours as needed for wheezing or shortness of breath. 02/14/21  Yes Angiulli, Lavon Paganini, PA-C  amLODipine (NORVASC) 10 MG tablet Take 1 tablet (10 mg total) by mouth daily. 05/15/21  Yes [name], MD  aspirin EC 81 MG tablet Take 1 tablet (81 mg total) by mouth daily. Swallow whole. 12/17/20  Yes Angelia Mould, MD  atorvastatin (LIPITOR) 80 MG tablet Take 1 tablet (80 mg total) by mouth daily. 05/24/21  Yes Masters, Katie, DO  bictegravir-emtricitabine-tenofovir AF (BIKTARVY) 50-200-25 MG TABS tablet TAKE 1 TABLET BY MOUTH DAILY. 06/16/21 07/24/21 Yes Michel Bickers, MD  calcitRIOL (ROCALTROL) 0.25 MCG capsule Take 1 capsule (0.25 mcg total) by mouth Every Tuesday,Thursday,and Saturday with dialysis. 04/01/21  Yes Ulyses Amor, PA-C  calcium acetate (PHOSLO) 667 MG capsule Take 3 capsules (2,001 mg total) by mouth 3 (three) times daily with meals. 02/14/21  Yes Angiulli, Lavon Paganini, PA-C  clopidogrel (PLAVIX) 75 MG tablet Take 1 tablet (75 mg total) by mouth daily. 02/14/21  Yes Angiulli, Lavon Paganini, PA-C  cyclobenzaprine (FLEXERIL) 5 MG tablet Take 1 tablet (5 mg total) by mouth 2 (two) times daily as needed for muscle spasms. 05/23/21  Yes Masters, Katie, DO  gabapentin (NEURONTIN) 100 MG capsule Take 1-3 capsules (100-300 mg total) by mouth See admin instructions. Take 100 mg on Monday, Wednesday, Friday and Sunday Patient taking differently: Take 100  mg by mouth See admin instructions. Take 100 mg on Monday, Wednesday, Friday and Sunday 05/23/21 08/21/21 Yes Masters, Katie, DO  glucose blood test strip Use as instructed 05/02/21  Yes Atway, Rayann N, DO  hydrALAZINE (APRESOLINE) 25 MG tablet Take 3 tablets (75 mg total) by mouth 3 (three) times daily. 05/23/21  Yes Masters, Katie, DO  insulin glargine (LANTUS SOLOSTAR) 100 UNIT/ML Solostar Pen Inject 8 Units into the skin at bedtime. Patient taking differently: Inject 10 Units into the skin at bedtime. 02/14/21  Yes Angiulli, Lavon Paganini, PA-C  Insulin Pen Needle (PENTIPS) 32G X 4 MM MISC Use as directed 02/14/21  Yes Meredith Staggers, MD  liraglutide (VICTOZA) 18 MG/3ML SOPN Inject 0.6 mg into the skin daily. 05/02/21 07/10/21 Yes Atway, Rayann N, DO  metoprolol tartrate (LOPRESSOR) 25 MG tablet Take 1 tablet (25 mg total) by mouth 2 (two) times daily. 05/23/21  Yes Masters, Katie, DO  multivitamin (RENA-VIT) TABS tablet Take 1 tablet by mouth daily. 02/14/21  Yes Angiulli, Lavon Paganini, PA-C  pantoprazole (PROTONIX) 40 MG tablet Take 1 tablet (40 mg total) by mouth daily. 02/14/21  Yes Angiulli, Lavon Paganini, PA-C  polyethylene glycol (MIRALAX / GLYCOLAX) 17 g packet Take 17 g by mouth 2 (two) times daily. Patient taking differently: Take 17 g by mouth daily as needed for mild constipation or  moderate constipation. 02/14/21  Yes Angiulli, Lavon Paganini, PA-C  sertraline (ZOLOFT) 50 MG tablet Take 1 tablet (50 mg total) by mouth at bedtime. 02/14/21  Yes Angiulli, Lavon Paganini, PA-C    Allergies    Patient has no known allergies.  Review of Systems   Review of Systems  Respiratory:  Positive for shortness of breath.   Neurological:  Positive for headaches.  All other systems reviewed and are negative.  Physical Exam Updated Vital Signs BP (!) 169/91   Pulse 97   Temp 98.1 F (36.7 C)   Resp 17   SpO2 99%   Physical Exam Vitals and nursing note reviewed.  Constitutional:      Appearance: She is well-developed.  HENT:     Head: Normocephalic and atraumatic.  Cardiovascular:     Rate and Rhythm: Normal rate and regular rhythm.     Heart sounds: No murmur heard. Pulmonary:     Effort: Pulmonary effort is normal. No respiratory distress.     Breath sounds: Normal breath sounds.  Abdominal:     Palpations: Abdomen is soft.     Tenderness: There is no abdominal tenderness. There is no guarding or rebound.  Musculoskeletal:        General: No tenderness.     Comments: RLE BKA, LLE AKA.  Fistula in LUE with palpable thrill.    Skin:    General: Skin is warm and dry.  Neurological:     Mental Status: She is alert and oriented to person, place, and time.  Psychiatric:        Behavior: Behavior normal.    ED Results / Procedures / Treatments   Labs (all labs ordered are listed, but only abnormal results are displayed) Labs Reviewed  COMPREHENSIVE METABOLIC PANEL - Abnormal; Notable for the following components:      Result Value   Potassium 7.2 (*)    CO2 14 (*)    Glucose, Bld 103 (*)    BUN 94 (*)    Creatinine, Ser 11.86 (*)    Calcium 6.4 (*)    Total Protein 8.2 (*)  Albumin 3.4 (*)    AST 10 (*)    GFR, Estimated 4 (*)    Anion gap 22 (*)    All other components within normal limits  CBC WITH DIFFERENTIAL/PLATELET - Abnormal; Notable for the following  components:   RBC 2.97 (*)    Hemoglobin 9.0 (*)    HCT 28.2 (*)    RDW 18.2 (*)    All other components within normal limits  CBG MONITORING, ED - Abnormal; Notable for the following components:   Glucose-Capillary 104 (*)    All other components within normal limits  CBG MONITORING, ED - Abnormal; Notable for the following components:   Glucose-Capillary 208 (*)    All other components within normal limits  CBG MONITORING, ED - Abnormal; Notable for the following components:   Glucose-Capillary 154 (*)    All other components within normal limits  TROPONIN I (HIGH SENSITIVITY) - Abnormal; Notable for the following components:   Troponin I (High Sensitivity) 57 (*)    All other components within normal limits  TROPONIN I (HIGH SENSITIVITY) - Abnormal; Notable for the following components:   Troponin I (High Sensitivity) 57 (*)    All other components within normal limits  RESP PANEL BY RT-PCR (FLU A&B, COVID) ARPGX2  HEPATITIS B SURFACE ANTIGEN  HEPATITIS B SURFACE ANTIBODY,QUALITATIVE  HEPATITIS B SURFACE ANTIBODY, QUANTITATIVE  I-STAT BETA HCG BLOOD, ED ([facility], [facility], [facility] ONLY)    EKG EKG Interpretation  Date/Time:  Wednesday July 09 2021 23:55:25 EST Ventricular Rate:  98 PR Interval:  168 QRS Duration: 98 QT Interval:  426 QTC Calculation: 543 R Axis:   160 Text Interpretation: Normal sinus rhythm Possible Left atrial enlargement Right axis deviation Prolonged QT Abnormal ECG peaked T waves Confirmed by Addison Lank [phone]) on 07/10/2021 2:04:42 AM  Radiology DG Chest 2 View  Result Date: 07/10/2021 CLINICAL DATA:  Chest pain. EXAM: CHEST - 2 VIEW COMPARISON:  Chest radiograph dated 05/20/2021. FINDINGS: Mild cardiomegaly. There is blunting of the posterior costophrenic angle on the lateral view which may represent trace pleural effusion and associated atelectasis. No pneumothorax. Atherosclerotic calcification of the aortic arch. No acute osseous pathology. IMPRESSION:  Probable trace pleural effusion and associated atelectasis. Electronically Signed   By: Anner Crete M.D.   On: 07/10/2021 00:23   CT Head Wo Contrast  Result Date: 07/10/2021 CLINICAL DATA:  Headache. EXAM: CT HEAD WITHOUT CONTRAST TECHNIQUE: Contiguous axial images were obtained from the base of the skull through the vertex without intravenous contrast. COMPARISON:  Head CT dated 05/20/2021. FINDINGS: Brain: The ventricles and sulci appropriate size for patient's age. Focal area of hypodensity in the right periventricular white matter similar to prior CT. There is no acute intracranial hemorrhage. No mass effect or midline shift no extra-axial fluid collection. There is a cavum septum pellucidum and cavum vergae. Vascular: No hyperdense vessel or unexpected calcification. Skull: Normal. Negative for fracture or focal lesion. Sinuses/Orbits: Mild diffuse mucoperiosteal thickening of paranasal sinuses. No air-fluid level. The mastoid air cells are clear. Other: None IMPRESSION: 1. No acute intracranial pathology. 2. Area of old infarct in the right periventricular white matter. Electronically Signed   By: Anner Crete M.D.   On: 07/10/2021 01:11    Procedures Procedures  CRITICAL CARE Performed by: Quintella Reichert   Total critical care time: 35 minutes  Critical care time was exclusive of separately billable procedures and treating other patients.  Critical care was necessary to treat or prevent imminent or life-threatening deterioration.  Critical care was time spent personally by me on the following activities: development of treatment plan with patient and/or surrogate as well as nursing, discussions with consultants, evaluation of patient's response to treatment, examination of patient, obtaining history from patient or surrogate, ordering and performing treatments and interventions, ordering and review of laboratory studies, ordering and review of radiographic studies, pulse oximetry  and re-evaluation of patient's condition.  Medications Ordered in ED Medications  Chlorhexidine Gluconate Cloth 2 % PADS 6 each (has no administration in time range)  pentafluoroprop-tetrafluoroeth (GEBAUERS) aerosol 1 application (has no administration in time range)  lidocaine (PF) (XYLOCAINE) 1 % injection 5 mL (has no administration in time range)  lidocaine-prilocaine (EMLA) cream 1 application (has no administration in time range)  0.9 %  sodium chloride infusion (has no administration in time range)  0.9 %  sodium chloride infusion (has no administration in time range)  heparin injection 1,000 Units (has no administration in time range)  alteplase (CATHFLO ACTIVASE) injection 2 mg (has no administration in time range)  calcium gluconate inj 10% (1 g) URGENT USE ONLY! (1 g Intravenous Given 07/10/21 0336)  albuterol (PROVENTIL) (2.5 MG/3ML) 0.083% nebulizer solution 10 mg (10 mg Nebulization Given 07/10/21 0307)  insulin aspart (novoLOG) injection 5 Units (5 Units Intravenous Given 07/10/21 0339)    And  dextrose 50 % solution 50 mL (50 mLs Intravenous Given 07/10/21 0339)  dextrose 10 % infusion (0 mLs Intravenous Stopped 07/10/21 0430)  sodium bicarbonate injection 50 mEq (50 mEq Intravenous Given 07/10/21 0344)    ED Course  I have reviewed the triage vital signs and the nursing notes.  Pertinent labs & imaging results that were available during my care of the patient were reviewed by me and considered in my medical decision making (see chart for details).    MDM Rules/Calculators/A&P                          patient with ESR D on hemodialysis here for evaluation of malaise, shortness of breath after missing dialysis. BMP significant for hypokalemia. EKG is changed when compared to priors with QT prolongation as well as peaked T waves. She was treated with temporizing measures in the emergency department. Discussed with Dr. Johnney Ou with nephrology. Plan for emergent dialysis.  Discussed with patient treatment plan and she is in agreement with plan. After dialysis plan to return to the emergency department and reassess for symptom management. If her symptoms are improved she may be a candidate for discharge home.   Presentation is not consistent with ACS, pneumonia, PE.    Final Clinical Impression(s) / ED Diagnoses Final diagnoses:  Hyperkalemia  ESRD (end stage renal disease) on dialysis [facility])    Rx / [place] Orders ED Discharge Orders     None        Quintella Reichert, MD 07/10/21 0700

## 2021-07-10 NOTE — Procedures (Signed)
Pt seen on HD.  Feeling better. 3 L off w/ HD.  Repeat K+ pre HD was 5.3, she rec'd 1K bath for 40 min then 2K bath.  Plan is for return to ED post HD for reassessment prior to dc home.  Have ordered repeat K+ for 2 hrs post HD which will be about noon.     TTS [facility]   4h   400/1.5  67kg  2/2.5 bath   Hep 1500    LUA AVF   - mircera 150 q2 last 11/17  - calc 0.25 po tiw    I was present at this dialysis session, have reviewed the session itself and made  appropriate changes Kelly Splinter MD [place] pager [phone]   07/10/2021, 11:09 AM

## 2021-07-10 NOTE — ED Notes (Signed)
Notified MD of pt's elevated BP greater than 947 systolic and requested orders for PRN medication. Awaiting orders.

## 2021-07-10 NOTE — ED Provider Notes (Signed)
45 year old ESRD patient on HD originally presented for shortness of breath.  Noted to be hyperkalemic and in need of emergent dialysis.  She completed the session.  Upon return to the emergency department her symptoms have improved, potassium has reduced.  She is slightly hypertensive but did miss her midday dose of hydralazine.  This will be given here in the department.  She otherwise feels well and have no other complaints.  Patient at this time appears safe and stable for discharge and will be treated as an outpatient.  Discharge plan and strict return to ED precautions discussed, patient verbalizes understanding and agreement.   Lorelle Gibbs, DO 07/10/21 1521

## 2021-07-11 ENCOUNTER — Ambulatory Visit: Payer: Medicare Other | Admitting: Cardiology

## 2021-07-11 ENCOUNTER — Ambulatory Visit (INDEPENDENT_AMBULATORY_CARE_PROVIDER_SITE_OTHER): Payer: Medicare Other | Admitting: Internal Medicine

## 2021-07-11 ENCOUNTER — Telehealth: Payer: Self-pay | Admitting: Student

## 2021-07-11 DIAGNOSIS — G4440 Drug-induced headache, not elsewhere classified, not intractable: Secondary | ICD-10-CM | POA: Diagnosis not present

## 2021-07-11 DIAGNOSIS — T3995XA Adverse effect of unspecified nonopioid analgesic, antipyretic and antirheumatic, initial encounter: Secondary | ICD-10-CM

## 2021-07-11 LAB — HEPATITIS B SURFACE ANTIBODY, QUANTITATIVE: Hep B S AB Quant (Post): 63.1 m[IU]/mL (ref >9.9)

## 2021-07-11 NOTE — Telephone Encounter (Signed)
Patient is requesting a call back to speak with nurse in reference to her migraines.  Please use telephone number [phone].

## 2021-07-11 NOTE — Telephone Encounter (Signed)
Patient placed on Dr. Sammie Bench schedule this PM for tele appt.

## 2021-07-13 ENCOUNTER — Encounter: Payer: Self-pay | Admitting: Internal Medicine

## 2021-07-13 NOTE — Progress Notes (Signed)
   I connected with  Kathy Frank on 07/13/21 by telephone and verified that I am speaking with the correct person using two identifiers.   I discussed the limitations of evaluation and management by telemedicine. The patient expressed understanding and agreed to proceed.  CC: Headaches  This is a telephone encounter between Kathy Frank and Kathy Frank on 07/13/2021 for headaches. The visit was conducted with the patient located at home and Kathy Frank at [facility]. The patient's identity was confirmed using their DOB and current address. The patient has consented to being evaluated through a telephone encounter and understands the associated risks (an examination cannot be done and the patient may need to come in for an appointment) / benefits (allows the patient to remain at home, decreasing exposure to coronavirus). I personally spent 15 minutes on medical discussion.   HPI:  Kathy Frank is a 45 y.o. with PMH as below.   Please see A&P for assessment of the patient's acute and chronic medical conditions.   Past Medical History:  Diagnosis Date   Anal abscess    chronic   Anxiety    CAD (coronary artery disease)    CVA (cerebral vascular accident) ([place])    Depression 06/28/2006   Qualifier: Diagnosis of  By: Riccardo Dubin MD, Todd     Diabetes mellitus type 2 in obese ([place]) 06/28/1994   Dyspnea    uses oxygeb 2 liters per minute at dialysis   End stage renal disease on dialysis [facility])    on hemodialysis T/Th/Sat   Erosive esophagitis    Esophageal reflux    Eye redness    Gastroparesis    ? diabetic   GERD (gastroesophageal reflux disease)    History of blood transfusion 2019   Human immunodeficiency virus (HIV) disease ([place]) 04/23/2016   Hyperlipidemia    Hypertension    Metabolic bone disease 52/77/8242   Moderate nonproliferative diabetic retinopathy of both eyes ([place]) 11/21/2014   11/14/14: Noted on retinal imaging; needs follow-up imaging in 6 months  05/22/16:  Noted on retinal imaging again; needs follow-up imaging in 6 months   PAD (peripheral artery disease) ([place])    Type 2 diabetes mellitus with diabetic peripheral angiopathy without gangrene ([place]) 05/01/2019   Wears dentures    lower   Wound infection s/p L transmetatarsal amputation    Review of Systems:  see problem based assessment and plan  Assessment & Plan:   See Encounters Tab for problem based charting.  Patient discussed with Dr. Dareen Piano

## 2021-07-13 NOTE — Assessment & Plan Note (Signed)
Patient presents for telehealth appointment for reevaluation of her headaches.  She has a chronic history of headaches over the past 2 months.  Currently she has a headache on her right temple that does not radiate.  She has photophobia but otherwise no associated symptoms or aura.  She was previously seen at the [facility] believes her headaches were secondary to analgesic overuse with rebound.  Patient states that she has been off NSAID medications for the last week without improvement of her headaches.  She continues to get headaches upwards of 2 times a week and can last for 1 to 3 days.  She denies any focal neurologic deficits, new medications, or obvious triggers.  She states that her headaches are worse in the middle of the day and denies any signs or symptoms of increased intracranial pressure.  Assessment/Plan: Symptoms are consistent with tension headache. - Prescribed Fioricet (called into [facility]) - Will need a follow up appointment in the clinic to evaluate her symptoms.  - She may need a referral to the headache clinic.

## 2021-07-14 NOTE — Progress Notes (Signed)
Internal Medicine Clinic Attending  Case discussed with Dr. Coe  At the time of the visit.  We reviewed the resident's history and exam and pertinent patient test results.  I agree with the assessment, diagnosis, and plan of care documented in the resident's note.  

## 2021-07-28 ENCOUNTER — Other Ambulatory Visit: Payer: Self-pay | Admitting: Internal Medicine

## 2021-07-28 ENCOUNTER — Other Ambulatory Visit (HOSPITAL_COMMUNITY): Payer: Self-pay

## 2021-07-28 DIAGNOSIS — B20 Human immunodeficiency virus [HIV] disease: Secondary | ICD-10-CM

## 2021-07-28 MED ORDER — BIKTARVY 50-200-25 MG PO TABS
1.0000 | ORAL_TABLET | Freq: Every day | ORAL | 0 refills | Status: DC
  Filled 2021-07-28: qty 30, 30d supply, fill #0

## 2021-07-30 ENCOUNTER — Other Ambulatory Visit (HOSPITAL_COMMUNITY): Payer: Self-pay

## 2021-07-31 ENCOUNTER — Other Ambulatory Visit: Payer: Self-pay | Admitting: Internal Medicine

## 2021-08-10 ENCOUNTER — Encounter (HOSPITAL_COMMUNITY): Payer: Self-pay

## 2021-08-12 DIAGNOSIS — R519 Headache, unspecified: Secondary | ICD-10-CM | POA: Diagnosis not present

## 2021-08-12 DIAGNOSIS — D689 Coagulation defect, unspecified: Secondary | ICD-10-CM | POA: Diagnosis not present

## 2021-08-12 DIAGNOSIS — E1110 Type 2 diabetes mellitus with ketoacidosis without coma: Secondary | ICD-10-CM | POA: Diagnosis not present

## 2021-08-12 DIAGNOSIS — D631 Anemia in chronic kidney disease: Secondary | ICD-10-CM | POA: Diagnosis not present

## 2021-08-12 DIAGNOSIS — Z992 Dependence on renal dialysis: Secondary | ICD-10-CM | POA: Diagnosis not present

## 2021-08-12 DIAGNOSIS — N2581 Secondary hyperparathyroidism of renal origin: Secondary | ICD-10-CM | POA: Diagnosis not present

## 2021-08-12 DIAGNOSIS — L299 Pruritus, unspecified: Secondary | ICD-10-CM | POA: Diagnosis not present

## 2021-08-12 DIAGNOSIS — N186 End stage renal disease: Secondary | ICD-10-CM | POA: Diagnosis not present

## 2021-08-13 ENCOUNTER — Other Ambulatory Visit: Payer: Self-pay

## 2021-08-13 ENCOUNTER — Ambulatory Visit (INDEPENDENT_AMBULATORY_CARE_PROVIDER_SITE_OTHER): Payer: Medicare Other | Admitting: Internal Medicine

## 2021-08-13 DIAGNOSIS — B20 Human immunodeficiency virus [HIV] disease: Secondary | ICD-10-CM | POA: Diagnosis not present

## 2021-08-13 DIAGNOSIS — T879 Unspecified complications of amputation stump: Secondary | ICD-10-CM | POA: Diagnosis not present

## 2021-08-13 DIAGNOSIS — F4323 Adjustment disorder with mixed anxiety and depressed mood: Secondary | ICD-10-CM

## 2021-08-13 MED ORDER — BIKTARVY 50-200-25 MG PO TABS: 1.0000 | ORAL_TABLET | Freq: Every day | ORAL | 11 refills | Status: DC

## 2021-08-13 NOTE — Progress Notes (Signed)
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   Urinary and fecal incontinence 05/30/2021   Epistaxis 05/27/2021   Status post coronary artery stent placement    Coronary artery disease involving native coronary artery of native heart with unstable angina pectoris ([place])    Hypertensive urgency 05/20/2021   History of non-ST elevation myocardial infarction (NSTEMI) 05/20/2021   BKA stump complication ([place]) 36/64/4034   Right below-knee amputee ([place]) 02/08/2021   Peripheral arterial disease ([place]) 01/29/2021   Patient's other noncompliance with medication regimen 12/28/2019   Major depressive disorder, single episode, unspecified 04/24/2019   Secondary hyperparathyroidism of renal origin ([place]) 04/24/2019   S/P AKA (above knee amputation), left ([place]) 12/16/2018   Anemia due to chronic kidney disease    Moderate protein-calorie malnutrition (HCC)    Adjustment disorder with mixed anxiety and depressed mood 08/15/2018   ESRD (end stage renal disease) ([place])    Fecal incontinence 02/04/2018   Aortic atherosclerosis ([place]) 09/30/2016   Gastroparesis    Moderate nonproliferative diabetic retinopathy of both eyes ([place]) 11/21/2014   Esophageal reflux    Hypertension 11/16/2013   Depression 06/28/2006   Diabetes mellitus type 2 in obese ([place]) 06/28/1994    Patient's Medications  New Prescriptions   No medications on file  Previous Medications   ACCU-CHEK SOFTCLIX LANCETS LANCETS    Use as instructed   ALBUTEROL (VENTOLIN HFA) 108 (90 BASE) MCG/ACT INHALER    Inhale 2 puffs into the lungs every 4 (four) hours as needed for wheezing or shortness of breath.   AMLODIPINE (NORVASC) 10 MG TABLET    Take 1 tablet (10 mg total) by mouth daily.   ASPIRIN EC 81 MG TABLET    Take 1  tablet (81 mg total) by mouth daily. Swallow whole.   ATORVASTATIN (LIPITOR) 80 MG TABLET    Take 1 tablet (80 mg total) by mouth daily.   CALCITRIOL (ROCALTROL) 0.25 MCG CAPSULE    Take 1 capsule (0.25 mcg total) by mouth Every Tuesday,Thursday,and Saturday with dialysis.   CALCIUM ACETATE (PHOSLO) 667 MG CAPSULE    Take 3 capsules (2,001 mg total) by mouth 3 (three) times daily with meals.   CLOPIDOGREL (PLAVIX) 75 MG TABLET    Take 1 tablet (75 mg total) by mouth daily.   CYCLOBENZAPRINE (FLEXERIL) 5 MG TABLET    Take 1 tablet (5 mg total) by mouth 2 (two) times daily as needed for muscle spasms.   GABAPENTIN (NEURONTIN) 100 MG CAPSULE    Take 1-3 capsules (100-300 mg total) by mouth See admin instructions. Take 100 mg on Monday, Wednesday, Friday and Sunday   GLUCOSE BLOOD TEST STRIP    Use as instructed   HYDRALAZINE (APRESOLINE) 25 MG TABLET    Take 3 tablets (75 mg total) by mouth 3 (three) times daily.   INSULIN GLARGINE (LANTUS SOLOSTAR) 100 UNIT/ML SOLOSTAR PEN    Inject 8 Units into the skin at bedtime.   INSULIN PEN NEEDLE (PENTIPS) 32G X 4 MM MISC    Use as directed   LIRAGLUTIDE (VICTOZA) 18 MG/3ML SOPN    Inject 0.6 mg into the skin daily.   METOPROLOL TARTRATE (LOPRESSOR) 25 MG TABLET    Take 1 tablet (  25 mg total) by mouth 2 (two) times daily.   MULTIVITAMIN (RENA-VIT) TABS TABLET    Take 1 tablet by mouth daily.   PANTOPRAZOLE (PROTONIX) 40 MG TABLET    Take 1 tablet (40 mg total) by mouth daily.   POLYETHYLENE GLYCOL (MIRALAX / GLYCOLAX) 17 G PACKET    Take 17 g by mouth 2 (two) times daily.   SERTRALINE (ZOLOFT) 50 MG TABLET    Take 1 tablet (50 mg total) by mouth at bedtime.  Modified Medications   Modified Medication Previous Medication   BICTEGRAVIR-EMTRICITABINE-TENOFOVIR AF (BIKTARVY) 50-200-25 MG TABS TABLET bictegravir-emtricitabine-tenofovir AF (BIKTARVY) 50-200-25 MG TABS tablet      TAKE 1 TABLET BY MOUTH DAILY.    TAKE 1 TABLET BY MOUTH DAILY.  Discontinued  Medications   No medications on file    Subjective: Kathy Frank is in for her routine HIV follow-up visit.  She denies any problems obtaining, taking or tolerating her Biktarvy and does not recall missing doses.  She takes it each day around noon.  She underwent a right BKA after her last visit here because of dry gangrene of her right foot.  She required revision about her stump has now healed.  She continues to be bothered by frequent "migraine" headaches.  She still has disabling anxiety and panic attacks 3-4 times each week.  She says that she has never received a call back from our behavioral health counselor.  She is having a great deal of difficulty sleeping.  Review of Systems: Review of Systems  Constitutional:  Negative for fever and weight loss.  HENT:  Positive for nosebleeds.   Respiratory:  Negative for cough.   Cardiovascular:  Negative for chest pain.  Gastrointestinal:  Negative for abdominal pain, diarrhea, nausea and vomiting.  Neurological:  Positive for headaches.  Psychiatric/Behavioral:  Negative for depression. The patient is nervous/anxious and has insomnia.    Past Medical History:  Diagnosis Date   Anal abscess    chronic   Anxiety    CAD (coronary artery disease)    CVA (cerebral vascular accident) ([place])    Depression 06/28/2006   Qualifier: Diagnosis of  By: Riccardo Dubin MD, Todd     Diabetes mellitus type 2 in obese ([place]) 06/28/1994   Dyspnea    uses oxygeb 2 liters per minute at dialysis   End stage renal disease on dialysis [facility])    on hemodialysis T/Th/Sat   Erosive esophagitis    Esophageal reflux    Eye redness    Gastroparesis    ? diabetic   GERD (gastroesophageal reflux disease)    History of blood transfusion 2019   Human immunodeficiency virus (HIV) disease ([place]) 04/23/2016   Hyperlipidemia    Hypertension    Metabolic bone disease 68/07/7516   Moderate nonproliferative diabetic retinopathy of both eyes ([place]) 11/21/2014   11/14/14: Noted on  retinal imaging; needs follow-up imaging in 6 months  05/22/16: Noted on retinal imaging again; needs follow-up imaging in 6 months   PAD (peripheral artery disease) ([place])    Type 2 diabetes mellitus with diabetic peripheral angiopathy without gangrene ([place]) 05/01/2019   Wears dentures    lower   Wound infection s/p L transmetatarsal amputation     Social History   Tobacco Use   Smoking status: Former    Packs/day: 0.10    Years: 10.00    Pack years: 1.00    Types: Cigarettes    Start date: 11/17/2013    Quit date: 09/09/2014  Years since quitting: 6.9   Smokeless tobacco: Never  Vaping Use   Vaping Use: Never used  Substance Use Topics   Alcohol use: Not Currently   Drug use: Not Currently    Types: Marijuana    Comment: 2 times a monthy -  `last time Juily 2022    Family History  Problem Relation Age of Onset   Diabetes Mother    Diabetes Brother    Diabetes Daughter    Diabetes Daughter    Mental retardation Brother        died from PNA   Diabetes Maternal Grandmother     No Known Allergies  Health Maintenance  Topic Date Due   OPHTHALMOLOGY EXAM  05/22/2017   Pneumococcal Vaccine 51-26 Years old (3 - PPSV23 if available, else PCV20) 01/06/2019   COVID-19 Vaccine (2 - Moderna risk series) 11/28/2019   PAP SMEAR-Modifier  02/04/2021   FOOT EXAM  05/13/2021   HEMOGLOBIN A1C  07/18/2021   LIPID PANEL  05/22/2022   TETANUS/TDAP  09/18/2027   INFLUENZA VACCINE  Completed   Hepatitis C Screening  Completed   HIV Screening  Completed   HPV VACCINES  Aged Out    Objective:  Vitals:   08/13/21 1004  BP: 133/83  Pulse: 77  Temp: (!) 97.5 F (36.4 C)  TempSrc: Oral  SpO2: 100%   There is no height or weight on file to calculate BMI.  Physical Exam Constitutional:      Comments: She is very pleasant and talkative today.  She is seated in a wheelchair.  HENT:     Mouth/Throat:     Comments: Poor dentition. Cardiovascular:     Rate and Rhythm: Normal  rate and regular rhythm.     Heart sounds: No murmur heard. Pulmonary:     Effort: Pulmonary effort is normal.     Breath sounds: Normal breath sounds.  Musculoskeletal:     Comments: Her right BKA stump incision is healing nicely without evidence of infection.  Psychiatric:        Mood and Affect: Mood normal.    Lab Results Lab Results  Component Value Date   WBC 5.5 07/10/2021   HGB 7.9 (L) 07/10/2021   HCT 24.6 (L) 07/10/2021   MCV 93.5 07/10/2021   PLT 184 07/10/2021    Lab Results  Component Value Date   CREATININE 12.12 (H) 07/10/2021   BUN 99 (H) 07/10/2021   NA 142 07/10/2021   K 3.3 (L) 07/10/2021   CL 103 07/10/2021   CO2 15 (L) 07/10/2021    Lab Results  Component Value Date   ALT 8 07/09/2021   AST 10 (L) 07/09/2021   ALKPHOS 65 07/09/2021   BILITOT 0.8 07/09/2021    Lab Results  Component Value Date   CHOL 177 05/22/2021   HDL 38 (L) 05/22/2021   LDLCALC 96 05/22/2021   TRIG 214 (H) 05/22/2021   CHOLHDL 4.7 05/22/2021   Lab Results  Component Value Date   LABRPR NON REACTIVE 04/22/2021   HIV 1 RNA Quant  Date Value  04/22/2021 <20 copies/mL  02/13/2021 <20 copies/mL  05/29/2020 <20 Copies/mL   HIV-1 RNA Viral Load (copies/mL)  Date Value  12/23/2017 80  02/02/2017 80   CD4 T Cell Abs (/uL)  Date Value  05/29/2020 563  02/23/2019 526  10/29/2018 700     Problem List Items Addressed This Visit       Medium    Human immunodeficiency virus (HIV)  disease ([place]) (Chronic)    Her infection has been under excellent, long-term control.  She will continue Biktarvy, get lab work today and follow-up in 6 months.  She is up-to-date on her influenza and COVID vaccines.      Relevant Medications   bictegravir-emtricitabine-tenofovir AF (BIKTARVY) 50-200-25 MG TABS tablet   Other Relevant Orders   T-helper cell (CD4)- ([facility] clinic only)   HIV-1 RNA quant-no reflex-bld     Unprioritized   Adjustment disorder with mixed anxiety and depressed  mood (Chronic)    We will make her an appointment with our behavioral health counselor before she leaves here today.      BKA stump complication ([place])    Her incision is healed nicely following recent revision surgery.         Michel Bickers, MD [facility] [place] [facility] [phone] pager   [phone] cell 08/13/2021, 10:20 AM

## 2021-08-13 NOTE — Assessment & Plan Note (Signed)
We will make her an appointment with our behavioral health counselor before she leaves here today.

## 2021-08-13 NOTE — Assessment & Plan Note (Signed)
Her infection has been under excellent, long-term control.  She will continue Biktarvy, get lab work today and follow-up in 6 months.  She is up-to-date on her influenza and COVID vaccines.

## 2021-08-13 NOTE — Assessment & Plan Note (Signed)
Her incision is healed nicely following recent revision surgery.

## 2021-08-14 DIAGNOSIS — L299 Pruritus, unspecified: Secondary | ICD-10-CM | POA: Diagnosis not present

## 2021-08-14 DIAGNOSIS — R519 Headache, unspecified: Secondary | ICD-10-CM | POA: Diagnosis not present

## 2021-08-14 DIAGNOSIS — N186 End stage renal disease: Secondary | ICD-10-CM | POA: Diagnosis not present

## 2021-08-14 DIAGNOSIS — D631 Anemia in chronic kidney disease: Secondary | ICD-10-CM | POA: Diagnosis not present

## 2021-08-14 DIAGNOSIS — Z992 Dependence on renal dialysis: Secondary | ICD-10-CM | POA: Diagnosis not present

## 2021-08-14 DIAGNOSIS — D689 Coagulation defect, unspecified: Secondary | ICD-10-CM | POA: Diagnosis not present

## 2021-08-14 DIAGNOSIS — N2581 Secondary hyperparathyroidism of renal origin: Secondary | ICD-10-CM | POA: Diagnosis not present

## 2021-08-14 DIAGNOSIS — E1110 Type 2 diabetes mellitus with ketoacidosis without coma: Secondary | ICD-10-CM | POA: Diagnosis not present

## 2021-08-14 LAB — T-HELPER CELL (CD4) - (RCID CLINIC ONLY)
CD4 % Helper T Cell: 34 % (ref 33–65)
CD4 T Cell Abs: 623 /uL (ref 400–1790)

## 2021-08-15 ENCOUNTER — Encounter: Payer: Medicare Other | Admitting: Student

## 2021-08-15 LAB — HIV-1 RNA QUANT-NO REFLEX-BLD
HIV 1 RNA Quant: NOT DETECTED Copies/mL
HIV-1 RNA Quant, Log: NOT DETECTED Log cps/mL

## 2021-08-16 DIAGNOSIS — N2581 Secondary hyperparathyroidism of renal origin: Secondary | ICD-10-CM | POA: Diagnosis not present

## 2021-08-16 DIAGNOSIS — E1110 Type 2 diabetes mellitus with ketoacidosis without coma: Secondary | ICD-10-CM | POA: Diagnosis not present

## 2021-08-16 DIAGNOSIS — D631 Anemia in chronic kidney disease: Secondary | ICD-10-CM | POA: Diagnosis not present

## 2021-08-16 DIAGNOSIS — D689 Coagulation defect, unspecified: Secondary | ICD-10-CM | POA: Diagnosis not present

## 2021-08-16 DIAGNOSIS — L299 Pruritus, unspecified: Secondary | ICD-10-CM | POA: Diagnosis not present

## 2021-08-16 DIAGNOSIS — N186 End stage renal disease: Secondary | ICD-10-CM | POA: Diagnosis not present

## 2021-08-16 DIAGNOSIS — R519 Headache, unspecified: Secondary | ICD-10-CM | POA: Diagnosis not present

## 2021-08-16 DIAGNOSIS — Z992 Dependence on renal dialysis: Secondary | ICD-10-CM | POA: Diagnosis not present

## 2021-08-17 DIAGNOSIS — E1169 Type 2 diabetes mellitus with other specified complication: Secondary | ICD-10-CM | POA: Diagnosis not present

## 2021-08-17 DIAGNOSIS — N186 End stage renal disease: Secondary | ICD-10-CM | POA: Diagnosis not present

## 2021-08-17 DIAGNOSIS — Z89511 Acquired absence of right leg below knee: Secondary | ICD-10-CM | POA: Diagnosis not present

## 2021-08-17 DIAGNOSIS — Z89612 Acquired absence of left leg above knee: Secondary | ICD-10-CM | POA: Diagnosis not present

## 2021-08-19 ENCOUNTER — Other Ambulatory Visit: Payer: Self-pay

## 2021-08-19 DIAGNOSIS — D631 Anemia in chronic kidney disease: Secondary | ICD-10-CM | POA: Diagnosis not present

## 2021-08-19 DIAGNOSIS — R519 Headache, unspecified: Secondary | ICD-10-CM | POA: Diagnosis not present

## 2021-08-19 DIAGNOSIS — Z992 Dependence on renal dialysis: Secondary | ICD-10-CM | POA: Diagnosis not present

## 2021-08-19 DIAGNOSIS — E1110 Type 2 diabetes mellitus with ketoacidosis without coma: Secondary | ICD-10-CM | POA: Diagnosis not present

## 2021-08-19 DIAGNOSIS — N186 End stage renal disease: Secondary | ICD-10-CM | POA: Diagnosis not present

## 2021-08-19 DIAGNOSIS — N2581 Secondary hyperparathyroidism of renal origin: Secondary | ICD-10-CM | POA: Diagnosis not present

## 2021-08-19 DIAGNOSIS — L299 Pruritus, unspecified: Secondary | ICD-10-CM | POA: Diagnosis not present

## 2021-08-19 DIAGNOSIS — I739 Peripheral vascular disease, unspecified: Secondary | ICD-10-CM

## 2021-08-19 DIAGNOSIS — D689 Coagulation defect, unspecified: Secondary | ICD-10-CM | POA: Diagnosis not present

## 2021-08-20 ENCOUNTER — Other Ambulatory Visit (HOSPITAL_COMMUNITY): Payer: Self-pay

## 2021-08-20 ENCOUNTER — Other Ambulatory Visit: Payer: Self-pay | Admitting: Internal Medicine

## 2021-08-20 ENCOUNTER — Other Ambulatory Visit: Payer: Self-pay

## 2021-08-20 DIAGNOSIS — B20 Human immunodeficiency virus [HIV] disease: Secondary | ICD-10-CM

## 2021-08-20 MED ORDER — BIKTARVY 50-200-25 MG PO TABS
1.0000 | ORAL_TABLET | Freq: Every day | ORAL | 5 refills | Status: DC
  Filled 2021-08-20: qty 30, 30d supply, fill #0
  Filled 2021-09-15: qty 30, 30d supply, fill #1
  Filled 2021-10-24: qty 30, 30d supply, fill #2
  Filled 2021-12-01: qty 30, 30d supply, fill #3
  Filled 2021-12-23: qty 30, 30d supply, fill #4
  Filled 2022-01-28: qty 30, 30d supply, fill #5

## 2021-08-20 NOTE — Telephone Encounter (Signed)
Canceled Rx with [facility] and resent to [facility].   Beryle Flock, RN

## 2021-08-21 ENCOUNTER — Other Ambulatory Visit: Payer: Self-pay

## 2021-08-21 ENCOUNTER — Ambulatory Visit: Payer: Commercial Managed Care - HMO

## 2021-08-21 ENCOUNTER — Other Ambulatory Visit (HOSPITAL_COMMUNITY): Payer: Self-pay

## 2021-08-21 DIAGNOSIS — R519 Headache, unspecified: Secondary | ICD-10-CM | POA: Diagnosis not present

## 2021-08-21 DIAGNOSIS — D631 Anemia in chronic kidney disease: Secondary | ICD-10-CM | POA: Diagnosis not present

## 2021-08-21 DIAGNOSIS — N2581 Secondary hyperparathyroidism of renal origin: Secondary | ICD-10-CM | POA: Diagnosis not present

## 2021-08-21 DIAGNOSIS — Z992 Dependence on renal dialysis: Secondary | ICD-10-CM | POA: Diagnosis not present

## 2021-08-21 DIAGNOSIS — N186 End stage renal disease: Secondary | ICD-10-CM | POA: Diagnosis not present

## 2021-08-21 DIAGNOSIS — E1110 Type 2 diabetes mellitus with ketoacidosis without coma: Secondary | ICD-10-CM | POA: Diagnosis not present

## 2021-08-21 DIAGNOSIS — L299 Pruritus, unspecified: Secondary | ICD-10-CM | POA: Diagnosis not present

## 2021-08-21 DIAGNOSIS — D689 Coagulation defect, unspecified: Secondary | ICD-10-CM | POA: Diagnosis not present

## 2021-08-21 NOTE — Progress Notes (Unsigned)
No Show for therapy. Client may have had prior/other appts w.in clinic

## 2021-08-23 DIAGNOSIS — D631 Anemia in chronic kidney disease: Secondary | ICD-10-CM | POA: Diagnosis not present

## 2021-08-23 DIAGNOSIS — N186 End stage renal disease: Secondary | ICD-10-CM | POA: Diagnosis not present

## 2021-08-23 DIAGNOSIS — D689 Coagulation defect, unspecified: Secondary | ICD-10-CM | POA: Diagnosis not present

## 2021-08-23 DIAGNOSIS — L299 Pruritus, unspecified: Secondary | ICD-10-CM | POA: Diagnosis not present

## 2021-08-23 DIAGNOSIS — Z992 Dependence on renal dialysis: Secondary | ICD-10-CM | POA: Diagnosis not present

## 2021-08-23 DIAGNOSIS — N2581 Secondary hyperparathyroidism of renal origin: Secondary | ICD-10-CM | POA: Diagnosis not present

## 2021-08-23 DIAGNOSIS — R519 Headache, unspecified: Secondary | ICD-10-CM | POA: Diagnosis not present

## 2021-08-23 DIAGNOSIS — E1110 Type 2 diabetes mellitus with ketoacidosis without coma: Secondary | ICD-10-CM | POA: Diagnosis not present

## 2021-08-26 DIAGNOSIS — D689 Coagulation defect, unspecified: Secondary | ICD-10-CM | POA: Diagnosis not present

## 2021-08-26 DIAGNOSIS — D631 Anemia in chronic kidney disease: Secondary | ICD-10-CM | POA: Diagnosis not present

## 2021-08-26 DIAGNOSIS — N186 End stage renal disease: Secondary | ICD-10-CM | POA: Diagnosis not present

## 2021-08-26 DIAGNOSIS — Z992 Dependence on renal dialysis: Secondary | ICD-10-CM | POA: Diagnosis not present

## 2021-08-26 DIAGNOSIS — R519 Headache, unspecified: Secondary | ICD-10-CM | POA: Diagnosis not present

## 2021-08-26 DIAGNOSIS — L299 Pruritus, unspecified: Secondary | ICD-10-CM | POA: Diagnosis not present

## 2021-08-26 DIAGNOSIS — N2581 Secondary hyperparathyroidism of renal origin: Secondary | ICD-10-CM | POA: Diagnosis not present

## 2021-08-26 DIAGNOSIS — E1110 Type 2 diabetes mellitus with ketoacidosis without coma: Secondary | ICD-10-CM | POA: Diagnosis not present

## 2021-08-27 ENCOUNTER — Encounter (HOSPITAL_COMMUNITY): Payer: Self-pay

## 2021-08-28 DIAGNOSIS — D689 Coagulation defect, unspecified: Secondary | ICD-10-CM | POA: Diagnosis not present

## 2021-08-28 DIAGNOSIS — L299 Pruritus, unspecified: Secondary | ICD-10-CM | POA: Diagnosis not present

## 2021-08-28 DIAGNOSIS — R519 Headache, unspecified: Secondary | ICD-10-CM | POA: Diagnosis not present

## 2021-08-28 DIAGNOSIS — N2581 Secondary hyperparathyroidism of renal origin: Secondary | ICD-10-CM | POA: Diagnosis not present

## 2021-08-28 DIAGNOSIS — E1110 Type 2 diabetes mellitus with ketoacidosis without coma: Secondary | ICD-10-CM | POA: Diagnosis not present

## 2021-08-28 DIAGNOSIS — N186 End stage renal disease: Secondary | ICD-10-CM | POA: Diagnosis not present

## 2021-08-28 DIAGNOSIS — Z992 Dependence on renal dialysis: Secondary | ICD-10-CM | POA: Diagnosis not present

## 2021-08-28 DIAGNOSIS — D631 Anemia in chronic kidney disease: Secondary | ICD-10-CM | POA: Diagnosis not present

## 2021-08-29 ENCOUNTER — Encounter (HOSPITAL_COMMUNITY): Payer: Self-pay

## 2021-09-02 DIAGNOSIS — L299 Pruritus, unspecified: Secondary | ICD-10-CM | POA: Diagnosis not present

## 2021-09-02 DIAGNOSIS — N2581 Secondary hyperparathyroidism of renal origin: Secondary | ICD-10-CM | POA: Diagnosis not present

## 2021-09-02 DIAGNOSIS — N186 End stage renal disease: Secondary | ICD-10-CM | POA: Diagnosis not present

## 2021-09-02 DIAGNOSIS — E1110 Type 2 diabetes mellitus with ketoacidosis without coma: Secondary | ICD-10-CM | POA: Diagnosis not present

## 2021-09-02 DIAGNOSIS — Z992 Dependence on renal dialysis: Secondary | ICD-10-CM | POA: Diagnosis not present

## 2021-09-02 DIAGNOSIS — R519 Headache, unspecified: Secondary | ICD-10-CM | POA: Diagnosis not present

## 2021-09-02 DIAGNOSIS — D689 Coagulation defect, unspecified: Secondary | ICD-10-CM | POA: Diagnosis not present

## 2021-09-02 DIAGNOSIS — D631 Anemia in chronic kidney disease: Secondary | ICD-10-CM | POA: Diagnosis not present

## 2021-09-03 ENCOUNTER — Other Ambulatory Visit: Payer: Self-pay

## 2021-09-03 ENCOUNTER — Telehealth: Payer: Self-pay

## 2021-09-03 ENCOUNTER — Ambulatory Visit: Payer: Medicaid Other | Admitting: Cardiology

## 2021-09-03 ENCOUNTER — Ambulatory Visit (INDEPENDENT_AMBULATORY_CARE_PROVIDER_SITE_OTHER): Payer: Medicare Other | Admitting: Cardiology

## 2021-09-03 ENCOUNTER — Ambulatory Visit: Payer: Medicare Other

## 2021-09-03 ENCOUNTER — Encounter: Payer: Self-pay | Admitting: Cardiology

## 2021-09-03 VITALS — BP 142/70 | HR 94 | Wt 149.0 lb

## 2021-09-03 DIAGNOSIS — R040 Epistaxis: Secondary | ICD-10-CM | POA: Diagnosis not present

## 2021-09-03 DIAGNOSIS — R002 Palpitations: Secondary | ICD-10-CM

## 2021-09-03 DIAGNOSIS — I2510 Atherosclerotic heart disease of native coronary artery without angina pectoris: Secondary | ICD-10-CM

## 2021-09-03 DIAGNOSIS — I10 Essential (primary) hypertension: Secondary | ICD-10-CM

## 2021-09-03 MED ORDER — PROPRANOLOL HCL 20 MG PO TABS: 20.0000 mg | ORAL_TABLET | Freq: Three times a day (TID) | ORAL | 3 refills | Status: DC

## 2021-09-03 NOTE — Progress Notes (Unsigned)
Enrolled for [employer] to mail a ZIO XT long term holter monitor to the patients address on file.  

## 2021-09-03 NOTE — Telephone Encounter (Signed)
Called pt to make sure she knows she has an appt this morning. She states "I do." Pt states she will be able to make the appt.

## 2021-09-03 NOTE — Telephone Encounter (Signed)
Called pt to see what happed with her appt today. She states when she called to get the address they gave her [address] address. She went to the [address] [place] [address] office, they reschedule pt's appt. Dr. Harriet Masson wants to see this pt. Pt made aware, she will call her daughter to  see if she can bring her to the [place] office to be seen today. Pt given number for direct land-line to this nurse.

## 2021-09-03 NOTE — Patient Instructions (Addendum)
Medication Instructions:  Your physician has recommended you make the following change in your medication:  STOP: Lopressor START: Propranolol 20 mg every 8 hours *If you need a refill on your cardiac medications before your next appointment, please call your pharmacy*   Lab Work: None If you have labs (blood work) drawn today and your tests are completely normal, you will receive your results only by: [place] (if you have MyChart) OR A paper copy in the mail If you have any lab test that is abnormal or we need to change your treatment, we will call you to review the results.   Testing/Procedures: [place]- Long Term Monitor Instructions  Your physician has requested you wear a ZIO patch monitor for 14 days.  This is a single patch monitor. [employer] supplies one patch monitor per enrollment. Additional stickers are not available. Please do not apply patch if you will be having a Nuclear Stress Test,  Echocardiogram, Cardiac CT, MRI, or Chest Xray during the period you would be wearing the  monitor. The patch cannot be worn during these tests. You cannot remove and re-apply the  ZIO XT patch monitor.  Your ZIO patch monitor will be mailed 3 day USPS to your address on file. It may take 3-5 days  to receive your monitor after you have been enrolled.  Once you have received your monitor, please review the enclosed instructions. Your monitor  has already been registered assigning a specific monitor serial # to you.  Billing and Patient Assistance Program Information  We have supplied [employer] with any of your insurance information on file for billing purposes. [employer] offers a sliding scale Patient Assistance Program for patients that do not have  insurance, or whose insurance does not completely cover the cost of the ZIO monitor.  You must apply for the Patient Assistance Program to qualify for this discounted rate.  To apply, please call [employer] at [phone], select option 4,  select option 2, ask to apply for  Patient Assistance Program. Theodore Demark will ask your household income, and how many people  are in your household. They will quote your out-of-pocket cost based on that information.  [employer] will also be able to set up a 22-month, interest-free payment plan if needed.  Applying the monitor   Shave hair from upper left chest.  Hold abrader disc by orange tab. Rub abrader in 40 strokes over the upper left chest as  indicated in your monitor instructions.  Clean area with 4 enclosed alcohol pads. Let dry.  Apply patch as indicated in monitor instructions. Patch will be placed under collarbone on left  side of chest with arrow pointing upward.  Rub patch adhesive wings for 2 minutes. Remove white label marked "1". Remove the white  label marked "2". Rub patch adhesive wings for 2 additional minutes.  While looking in a mirror, press and release button in center of patch. A small green light will  flash 3-4 times. This will be your only indicator that the monitor has been turned on.  Do not shower for the first 24 hours. You may shower after the first 24 hours.  Press the button if you feel a symptom. You will hear a small click. Record Date, Time and  Symptom in the Patient Logbook.  When you are ready to remove the patch, follow instructions on the last 2 pages of Patient  Logbook. Stick patch monitor onto the last page of Patient Logbook.  Place Patient Logbook in the blue  and white box. Use locking tab on box and tape box closed  securely. The blue and white box has prepaid postage on it. Please place it in the mailbox as  soon as possible. Your physician should have your test results approximately 7 days after the  monitor has been mailed back to [facility].  Call [place] at [phone] if you have questions regarding  your ZIO XT patch monitor. Call them immediately if you see an orange light blinking on your  monitor.  If your  monitor falls off in less than 4 days, contact our Monitor department at [phone].  If your monitor becomes loose or falls off after 4 days call [employer] at [phone] for  suggestions on securing your monitor    Follow-Up: At [facility], you and your health needs are our priority.  As part of our continuing mission to provide you with exceptional heart care, we have created designated Provider Care Teams.  These Care Teams include your primary Cardiologist (physician) and Advanced Practice Providers (APPs -  Physician Assistants and Nurse Practitioners) who all work together to provide you with the care you need, when you need it.  We recommend signing up for the patient portal called "MyChart".  Sign up information is provided on this After Visit Summary.  MyChart is used to connect with patients for Virtual Visits (Telemedicine).  Patients are able to view lab/test results, encounter notes, upcoming appointments, etc.  Non-urgent messages can be sent to your provider as well.   To learn more about what you can do with MyChart, go to [email or web address].    Your next appointment:   12 week(s)  The format for your next appointment:   In Person  Provider:   Berniece Salines, DO     Other Instructions

## 2021-09-03 NOTE — Progress Notes (Signed)
Cardiology Office Note:    Date:  09/03/2021   ID:  TERIANNE THAKER, DOB March 04, 1976, MRN [id number]  PCP:  [name], MD  Cardiologist:  Berniece Salines, DO  Electrophysiologist:  None   Referring MD: [name], MD    History of Present Illness:    Kathy Frank is a 46 y.o. female with a hx of coronary artery disease status post stent to the LAD as a result of NSTEMI, CVA, peripheral artery disease, end-stage renal disease on hemodialysis, diabetes mellitus, hypertension, is here today for follow-up visit.  I first met the patient after she was hospitalized for NSTEMI.  During that time she underwent a left heart catheterization with PCI to the LAD.  Since her hospitalization this is her first follow-up.  She denies any chest pain.  She tells me she is feeling little better since her stent placement.  She has been following with dialysis and overall she feels better from a cardiovascular standpoint.  She notes that she has had some numbness and tingling in her legs. No other complaints at this time.  Past Medical History:  Diagnosis Date   Anal abscess    chronic   Anxiety    CAD (coronary artery disease)    CVA (cerebral vascular accident) ([place])    Depression 06/28/2006   Qualifier: Diagnosis of  By: Riccardo Dubin MD, [place]     Diabetes mellitus type 2 in obese ([place]) 06/28/1994   Dyspnea    uses oxygeb 2 liters per minute at dialysis   End stage renal disease on dialysis [facility])    on hemodialysis T/Th/Sat   Erosive esophagitis    Esophageal reflux    Eye redness    Gastroparesis    ? diabetic   GERD (gastroesophageal reflux disease)    History of blood transfusion 2019   Human immunodeficiency virus (HIV) disease ([place]) 04/23/2016   Hyperlipidemia    Hypertension    Metabolic bone disease 36/46/8032   Moderate nonproliferative diabetic retinopathy of both eyes ([place]) 11/21/2014   11/14/14: Noted on retinal imaging; needs follow-up imaging in 6 months  05/22/16: Noted on  retinal imaging again; needs follow-up imaging in 6 months   PAD (peripheral artery disease) ([place])    Type 2 diabetes mellitus with diabetic peripheral angiopathy without gangrene ([place]) 05/01/2019   Wears dentures    lower   Wound infection s/p L transmetatarsal amputation     Past Surgical History:  Procedure Laterality Date   ABDOMINAL AORTOGRAM W/LOWER EXTREMITY N/A 12/13/2020   Procedure: ABDOMINAL AORTOGRAM W/LOWER EXTREMITY;  Surgeon: Angelia Mould, MD;  Location: [place] [facility];  Service: Cardiovascular;  Laterality: N/A;   AMPUTATION Left 07/08/2018   Procedure: AMPUTATION FORTH RAY LEFT FOOT;  Surgeon: [name], MD;  Location: [place];  Service: Orthopedics;  Laterality: Left;   AMPUTATION Left 08/09/2018   Procedure: Left Transmetatarsal Amputation;  Surgeon: [name], MD;  Location: [place];  Service: Orthopedics;  Laterality: Left;   AMPUTATION Left 10/08/2018   Procedure: LEFT BELOW KNEE AMPUTATION;  Surgeon: [name], MD;  Location: [place];  Service: Orthopedics;  Laterality: Left;   AMPUTATION Left 10/28/2018   Procedure: REVISION BELOW KNEE AMPUTATION;  Surgeon: [name], MD;  Location: [place];  Service: Orthopedics;  Laterality: Left;   AMPUTATION Left 12/16/2018   Procedure: LEFT ABOVE KNEE AMPUTATION;  Surgeon: [name], MD;  Location: [place];  Service: Orthopedics;  Laterality: Left;   AMPUTATION  Right 01/31/2021   Procedure: AMPUTATION BELOW KNEE;  Surgeon: Serafina Mitchell, MD;  [facility];  Service: Vascular;  Laterality: Right;   AMPUTATION Right 03/28/2021   Procedure: REVISION OF RIGHT BELOW KNEE AMPUTATION;  Surgeon: Serafina Mitchell, MD;  Location: [place];  Service: Vascular;  Laterality: Right;   AMPUTATION Right 04/18/2021   Procedure: right below knee amputation/washout placement wound vac;  Surgeon: Cherre Robins, MD;  Location: [place];  Service: Vascular;  Laterality: Right;   AV FISTULA PLACEMENT Left 10/14/2018   Procedure:  Arteriovenous (Av) Fistula Creation Left Arm;  Surgeon: [name], MD;  Location: [place];  Service: Vascular;  Laterality: Left;   [place] Left 04/14/2019   Procedure: BASILIC VEIN TRANSPOSITION SECOND STAGE LEFT ARM;  Surgeon: Rosetta Posner, MD;  Location: [place];  Service: Vascular;  Laterality: Left;   CORONARY STENT INTERVENTION N/A 05/21/2021   Procedure: CORONARY STENT INTERVENTION;  Surgeon: Leonie Man, MD;  Location: [place] [facility];  Service: Cardiovascular;  Laterality: N/A;   INCISION AND DRAINAGE ABSCESS N/A 10/24/2020   Procedure: exicision of hydradenitis;  Surgeon: Leighton Ruff, MD;  Location: [place];  Service: General;  Laterality: N/A;  45 min   INSERTION OF DIALYSIS CATHETER Right 04/14/2019   Procedure: INSERTION OF DIALYSIS CATHETER;  Surgeon: Rosetta Posner, MD;  Location: [facility];  Service: Vascular;  Laterality: Right;   LEFT HEART CATH AND CORONARY ANGIOGRAPHY N/A 05/21/2021   Procedure: LEFT HEART CATH AND CORONARY ANGIOGRAPHY;  Surgeon: Leonie Man, MD;  Location: [place] [facility];  Service: Cardiovascular;  Laterality: N/A;   LOWER EXTREMITY ANGIOGRAPHY N/A 07/05/2018   Procedure: LOWER EXTREMITY ANGIOGRAPHY;  Surgeon: Serafina Mitchell, MD;  Location: [place] [facility];  Service: Cardiovascular;  Laterality: N/A;   PERIPHERAL VASCULAR ATHERECTOMY Right 12/13/2020   Procedure: PERIPHERAL VASCULAR ATHERECTOMY;  Surgeon: Angelia Mould, MD;  Location: [place] [facility];  Service: Cardiovascular;  Laterality: Right;  Superficial femoral   PERIPHERAL VASCULAR BALLOON ANGIOPLASTY Left 07/05/2018   Procedure: PERIPHERAL VASCULAR BALLOON ANGIOPLASTY;  Surgeon: Serafina Mitchell, MD;  Location: [place] [facility];  Service: Cardiovascular;  Laterality: Left;  SFA   PERIPHERAL VASCULAR BALLOON ANGIOPLASTY Right 12/13/2020   Procedure: PERIPHERAL VASCULAR BALLOON ANGIOPLASTY;  Surgeon: Angelia Mould, MD;   Location: [place] [facility];  Service: Cardiovascular;  Laterality: Right;  Peroneal artery, anterior tibial artery   RECTAL EXAM UNDER ANESTHESIA N/A 10/24/2020   Procedure: EXAM UNDER ANESTHESIA;  Surgeon: Leighton Ruff, MD;  Location: [facility];  Service: General;  Laterality: N/A;   STUMP REVISION Left 10/19/2018   Procedure: REVISION LEFT BELOW KNEE AMPUTATION;  Surgeon: [name], MD;  Location: [place];  Service: Orthopedics;  Laterality: Left;   TUBAL LIGATION  2002    Current Medications: Current Meds  Medication Sig   Accu-Chek Softclix Lancets lancets Use as instructed   albuterol (VENTOLIN HFA) 108 (90 Base) MCG/ACT inhaler Inhale 2 puffs into the lungs every 4 (four) hours as needed for wheezing or shortness of breath.   amLODipine (NORVASC) 10 MG tablet Take 1 tablet (10 mg total) by mouth daily.   aspirin EC 81 MG tablet Take 1 tablet (81 mg total) by mouth daily. Swallow whole.   atorvastatin (LIPITOR) 80 MG tablet Take 1 tablet (80 mg total) by mouth daily.   bictegravir-emtricitabine-tenofovir AF (BIKTARVY) 50-200-25 MG TABS tablet TAKE 1 TABLET  BY MOUTH DAILY.   calcitRIOL (ROCALTROL) 0.25 MCG capsule Take 1 capsule (0.25 mcg total) by mouth Every Tuesday,Thursday,and Saturday with dialysis.   calcium acetate (PHOSLO) 667 MG capsule Take 3 capsules (2,001 mg total) by mouth 3 (three) times daily with meals.   clopidogrel (PLAVIX) 75 MG tablet Take 1 tablet (75 mg total) by mouth daily.   cyclobenzaprine (FLEXERIL) 5 MG tablet Take 1 tablet (5 mg total) by mouth 2 (two) times daily as needed for muscle spasms.   ethyl chloride spray Apply topically.   glucose blood test strip Use as instructed   hydrALAZINE (APRESOLINE) 25 MG tablet Take 3 tablets (75 mg total) by mouth 3 (three) times daily.   insulin glargine (LANTUS SOLOSTAR) 100 UNIT/ML Solostar Pen Inject 8 Units into the skin at bedtime. (Patient taking differently: Inject 10 Units into the skin at  bedtime.)   Insulin Pen Needle (PENTIPS) 32G X 4 MM MISC Use as directed   multivitamin (RENA-VIT) TABS tablet Take 1 tablet by mouth daily.   pantoprazole (PROTONIX) 40 MG tablet Take 1 tablet (40 mg total) by mouth daily.   polyethylene glycol (MIRALAX / GLYCOLAX) 17 g packet Take 17 g by mouth 2 (two) times daily. (Patient taking differently: Take 17 g by mouth daily as needed for mild constipation or moderate constipation.)   propranolol (INDERAL) 20 MG tablet Take 1 tablet (20 mg total) by mouth every 8 (eight) hours.   sertraline (ZOLOFT) 50 MG tablet Take 1 tablet (50 mg total) by mouth at bedtime.   [DISCONTINUED] metoprolol tartrate (LOPRESSOR) 25 MG tablet Take 1 tablet (25 mg total) by mouth 2 (two) times daily.     Allergies:   Patient has no known allergies.   Social History   Socioeconomic History   Marital status: Single    Spouse name: Not on file   Number of children: Not on file   Years of education: 10   Highest education level: Not on file  Occupational History    Employer: UNEMPLOYED  Tobacco Use   Smoking status: Former    Packs/day: 0.10    Years: 10.00    Pack years: 1.00    Types: Cigarettes    Start date: 11/17/2013    Quit date: 09/09/2014    Years since quitting: 6.9   Smokeless tobacco: Never  Vaping Use   Vaping Use: Never used  Substance and Sexual Activity   Alcohol use: Not Currently   Drug use: Not Currently    Types: Marijuana    Comment: 2 times a monthy -  `last time Juily 2022   Sexual activity: Not Currently    Partners: Female, Female  Other Topics Concern   Not on file  Social History Narrative   Not on file   Social Determinants of Health   Financial Resource Strain: Not on file  Food Insecurity: Not on file  Transportation Needs: Not on file  Physical Activity: Not on file  Stress: Not on file  Social Connections: Not on file     Family History: The patient's family history includes Diabetes in her brother, daughter,  daughter, maternal grandmother, and mother; Mental retardation in her brother.  ROS:   Review of Systems  Constitution: Negative for decreased appetite, fever and weight gain.  HENT: Negative for congestion, ear discharge, hoarse voice and sore throat.   Eyes: Negative for discharge, redness, vision loss in right eye and visual halos.  Cardiovascular: Negative for chest pain, dyspnea on exertion, leg  swelling, orthopnea and palpitations.  Respiratory: Negative for cough, hemoptysis, shortness of breath and snoring.   Endocrine: Negative for heat intolerance and polyphagia.  Hematologic/Lymphatic: Negative for bleeding problem. Does not bruise/bleed easily.  Skin: Negative for flushing, nail changes, rash and suspicious lesions.  Musculoskeletal: Negative for arthritis, joint pain, muscle cramps, myalgias, neck pain and stiffness.  Gastrointestinal: Negative for abdominal pain, bowel incontinence, diarrhea and excessive appetite.  Genitourinary: Negative for decreased libido, genital sores and incomplete emptying.  Neurological: Negative for brief paralysis, focal weakness, headaches and loss of balance.  Psychiatric/Behavioral: Negative for altered mental status, depression and suicidal ideas.  Allergic/Immunologic: Negative for HIV exposure and persistent infections.    EKGs/Labs/Other Studies Reviewed:    The following studies were reviewed today:   EKG:  The ekg ordered today demonstrates sinus rhythm, heart rate 94 bpm with premature atrial complex Left bundle branch block old anterior wall infarction.  Recent Labs: 07/09/2021: ALT 8 07/10/2021: BUN 99; Creatinine, Ser 12.12; Hemoglobin 7.9; Platelets 184; Potassium 3.3; Sodium 142  Recent Lipid Panel    Component Value Date/Time   CHOL 177 05/22/2021 0317   CHOL 201 (H) 04/23/2016 1535   TRIG 214 (H) 05/22/2021 0317   HDL 38 (L) 05/22/2021 0317   HDL 54 04/23/2016 1535   CHOLHDL 4.7 05/22/2021 0317   VLDL 43 (H) 05/22/2021  0317   LDLCALC 96 05/22/2021 0317   LDLCALC 121 (H) 04/23/2016 1535    Physical Exam:    VS:  BP (!) 142/70 (BP Location: Right Arm, Patient Position: Sitting)    Pulse 94    Wt 149 lb (67.6 kg) Comment: Weight from 04/2021   SpO2 98%    BMI 24.79 kg/m     Wt Readings from Last 3 Encounters:  09/03/21 149 lb (67.6 kg)  05/23/21 148 lb 2.4 oz (67.2 kg)  05/02/21 149 lb 14.6 oz (68 kg)     GEN: Well nourished, well developed in no acute distress HEENT: Normal NECK: No JVD; No carotid bruits LYMPHATICS: No lymphadenopathy CARDIAC: S1S2 noted,RRR, no murmurs, rubs, gallops RESPIRATORY:  Clear to auscultation without rales, wheezing or rhonchi  ABDOMEN: Soft, non-tender, non-distended, +bowel sounds, no guarding. EXTREMITIES: No edema, No cyanosis, no clubbing MUSCULOSKELETAL:  No deformity  SKIN: Warm and dry NEUROLOGIC:  Alert and oriented x 3, non-focal PSYCHIATRIC:  Normal affect, good insight  ASSESSMENT:    1. Coronary artery disease involving native coronary artery of native heart, unspecified whether angina present   2. Hypertension, unspecified type   3. Palpitations   4. Frequent nosebleeds    PLAN:    Coronary artery disease-she denies any anginal symptoms.  Continue patient on her dual antiplatelet therapy.  She does mention that she has had some nosebleed.  I will refer the patient to ENT.  For now I have educated her on how to use Afrin spray.   In terms of her palpitations we will switch her medication around we will start the patient on propanolol 20 mg every 8 hours as she also does have untreated underlying anxiety which does will help and hopefully help her palpitations as well.  We will stop the Lopressor. For completeness given she does have risk factors for atrial fibrillation I will place a monitor on the patient for 14 days to rule out A. fib.   Hypertension-she is not at target today.  Most days especially at dialysis her blood pressure drops so we will  keep an eye on her antihypertensive regimen  for now.  We have given the patient blood pressure cuff and also educated her on how to use this device she will take her blood pressure daily she is aware of the fact that her target should be less than 130/80 mmHg.  ESRD-she is on hemodialysis.  Diabetes mellitus-this is being managed by his primary care doctor.  No adjustments for antidiabetic medications were made today.  We spoke heavily about diet modification and reducing salt in her diet.  She expresses understanding.  The patient is in agreement with the above plan. The patient left the office in stable condition.  The patient will follow up in 4 weeks or sooner if needed.   Medication Adjustments/Labs and Tests Ordered: Current medicines are reviewed at length with the patient today.  Concerns regarding medicines are outlined above.  Orders Placed This Encounter  Procedures   Ambulatory referral to ENT   LONG TERM MONITOR (3-14 DAYS)   EKG 12-Lead   Meds ordered this encounter  Medications   propranolol (INDERAL) 20 MG tablet    Sig: Take 1 tablet (20 mg total) by mouth every 8 (eight) hours.    Dispense:  270 tablet    Refill:  3    Patient Instructions  Medication Instructions:  Your physician has recommended you make the following change in your medication:  STOP: Lopressor START: Propranolol 20 mg every 8 hours *If you need a refill on your cardiac medications before your next appointment, please call your pharmacy*   Lab Work: None If you have labs (blood work) drawn today and your tests are completely normal, you will receive your results only by: [place] (if you have MyChart) OR A paper copy in the mail If you have any lab test that is abnormal or we need to change your treatment, we will call you to review the results.   Testing/Procedures: [place]- Long Term Monitor Instructions  Your physician has requested you wear a ZIO patch monitor for 14 days.   This is a single patch monitor. [employer] supplies one patch monitor per enrollment. Additional stickers are not available. Please do not apply patch if you will be having a Nuclear Stress Test,  Echocardiogram, Cardiac CT, MRI, or Chest Xray during the period you would be wearing the  monitor. The patch cannot be worn during these tests. You cannot remove and re-apply the  ZIO XT patch monitor.  Your ZIO patch monitor will be mailed 3 day USPS to your address on file. It may take 3-5 days  to receive your monitor after you have been enrolled.  Once you have received your monitor, please review the enclosed instructions. Your monitor  has already been registered assigning a specific monitor serial # to you.  Billing and Patient Assistance Program Information  We have supplied [employer] with any of your insurance information on file for billing purposes. [employer] offers a sliding scale Patient Assistance Program for patients that do not have  insurance, or whose insurance does not completely cover the cost of the ZIO monitor.  You must apply for the Patient Assistance Program to qualify for this discounted rate.  To apply, please call [employer] at [phone], select option 4, select option 2, ask to apply for  Patient Assistance Program. Theodore Demark will ask your household income, and how many people  are in your household. They will quote your out-of-pocket cost based on that information.  [employer] will also be able to set up a 44-month, interest-free payment plan if  needed.  Applying the monitor   Shave hair from upper left chest.  Hold abrader disc by orange tab. Rub abrader in 40 strokes over the upper left chest as  indicated in your monitor instructions.  Clean area with 4 enclosed alcohol pads. Let dry.  Apply patch as indicated in monitor instructions. Patch will be placed under collarbone on left  side of chest with arrow pointing upward.  Rub patch adhesive wings for 2 minutes. Remove  white label marked "1". Remove the white  label marked "2". Rub patch adhesive wings for 2 additional minutes.  While looking in a mirror, press and release button in center of patch. A small green light will  flash 3-4 times. This will be your only indicator that the monitor has been turned on.  Do not shower for the first 24 hours. You may shower after the first 24 hours.  Press the button if you feel a symptom. You will hear a small click. Record Date, Time and  Symptom in the Patient Logbook.  When you are ready to remove the patch, follow instructions on the last 2 pages of Patient  Logbook. Stick patch monitor onto the last page of Patient Logbook.  Place Patient Logbook in the blue and white box. Use locking tab on box and tape box closed  securely. The blue and white box has prepaid postage on it. Please place it in the mailbox as  soon as possible. Your physician should have your test results approximately 7 days after the  monitor has been mailed back to [facility].  Call [place] at [phone] if you have questions regarding  your ZIO XT patch monitor. Call them immediately if you see an orange light blinking on your  monitor.  If your monitor falls off in less than 4 days, contact our Monitor department at [phone].  If your monitor becomes loose or falls off after 4 days call [employer] at [phone] for  suggestions on securing your monitor    Follow-Up: At [facility], you and your health needs are our priority.  As part of our continuing mission to provide you with exceptional heart care, we have created designated Provider Care Teams.  These Care Teams include your primary Cardiologist (physician) and Advanced Practice Providers (APPs -  Physician Assistants and Nurse Practitioners) who all work together to provide you with the care you need, when you need it.  We recommend signing up for the patient portal called "MyChart".  Sign up  information is provided on this After Visit Summary.  MyChart is used to connect with patients for Virtual Visits (Telemedicine).  Patients are able to view lab/test results, encounter notes, upcoming appointments, etc.  Non-urgent messages can be sent to your provider as well.   To learn more about what you can do with MyChart, go to [email or web address].    Your next appointment:   12 week(s)  The format for your next appointment:   In Person  Provider:   Berniece Salines, DO     Other Instructions     Adopting a Healthy Lifestyle.  Know what a healthy weight is for you (roughly BMI <25) and aim to maintain this   Aim for 7+ servings of fruits and vegetables daily   65-80+ fluid ounces of water or unsweet tea for healthy kidneys   Limit to max 1 drink of alcohol per day; avoid smoking/tobacco   Limit animal fats in diet for cholesterol and heart health - choose  grass fed whenever available   Avoid highly processed foods, and foods high in saturated/trans fats   Aim for low stress - take time to unwind and care for your mental health   Aim for 150 min of moderate intensity exercise weekly for heart health, and weights twice weekly for bone health   Aim for 7-9 hours of sleep daily   When it comes to diets, agreement about the perfect plan isnt easy to find, even among the experts. Experts at the [employer] developed an idea known as the Healthy Eating Plate. Just imagine a plate divided into logical, healthy portions.   The emphasis is on diet quality:   Load up on vegetables and fruits - one-half of your plate: Aim for color and variety, and remember that potatoes dont count.   Go for whole grains - one-quarter of your plate: Whole wheat, barley, wheat berries, quinoa, oats, brown rice, and foods made with them. If you want pasta, go with whole wheat pasta.   Protein power - one-quarter of your plate: Fish, chicken, beans, and nuts are all healthy,  versatile protein sources. Limit red meat.   The diet, however, does go beyond the plate, offering a few other suggestions.   Use healthy plant oils, such as olive, canola, soy, corn, sunflower and peanut. Check the labels, and avoid partially hydrogenated oil, which have unhealthy trans fats.   If youre thirsty, drink water. Coffee and tea are good in moderation, but skip sugary drinks and limit milk and dairy products to one or two daily servings.   The type of carbohydrate in the diet is more important than the amount. Some sources of carbohydrates, such as vegetables, fruits, whole grains, and beans-are healthier than others.   Finally, stay active  Signed, Berniece Salines, DO  09/03/2021 11:32 AM    [place]

## 2021-09-04 DIAGNOSIS — N2581 Secondary hyperparathyroidism of renal origin: Secondary | ICD-10-CM | POA: Diagnosis not present

## 2021-09-04 DIAGNOSIS — Z992 Dependence on renal dialysis: Secondary | ICD-10-CM | POA: Diagnosis not present

## 2021-09-04 DIAGNOSIS — D631 Anemia in chronic kidney disease: Secondary | ICD-10-CM | POA: Diagnosis not present

## 2021-09-04 DIAGNOSIS — N186 End stage renal disease: Secondary | ICD-10-CM | POA: Diagnosis not present

## 2021-09-04 DIAGNOSIS — R519 Headache, unspecified: Secondary | ICD-10-CM | POA: Diagnosis not present

## 2021-09-04 DIAGNOSIS — E1110 Type 2 diabetes mellitus with ketoacidosis without coma: Secondary | ICD-10-CM | POA: Diagnosis not present

## 2021-09-04 DIAGNOSIS — L299 Pruritus, unspecified: Secondary | ICD-10-CM | POA: Diagnosis not present

## 2021-09-04 DIAGNOSIS — D689 Coagulation defect, unspecified: Secondary | ICD-10-CM | POA: Diagnosis not present

## 2021-09-05 ENCOUNTER — Encounter (HOSPITAL_COMMUNITY): Payer: Medicare Other

## 2021-09-05 ENCOUNTER — Other Ambulatory Visit: Payer: Self-pay

## 2021-09-05 ENCOUNTER — Ambulatory Visit (INDEPENDENT_AMBULATORY_CARE_PROVIDER_SITE_OTHER): Payer: Medicare Other | Admitting: Physician Assistant

## 2021-09-05 ENCOUNTER — Inpatient Hospital Stay (HOSPITAL_COMMUNITY): Admission: RE | Admit: 2021-09-05 | Payer: Medicare Other | Source: Ambulatory Visit

## 2021-09-05 VITALS — BP 152/84 | HR 90 | Temp 98.7°F | Ht 65.0 in

## 2021-09-05 DIAGNOSIS — I739 Peripheral vascular disease, unspecified: Secondary | ICD-10-CM

## 2021-09-05 NOTE — Progress Notes (Signed)
[facility] [place] HISTORY AND PHYSICAL   History of Present Illness:  Patient is a 46 y.o. 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  She states she had an MI back in Oct and that's why she has not followed up for incisional checks.  On her last visit in Sept. 2022 her right BKA stump.  She states she has not had any recent CP since her cardiac cath with stent.  Her stump has now completely healed.   She is medically managed on ASA, Plavix and Statin.    Past Medical History:  Diagnosis Date   Anal abscess    chronic   Anxiety    CAD (coronary artery disease)    CVA (cerebral vascular accident) ([place])    Depression 06/28/2006   Qualifier: Diagnosis of  By: Riccardo Dubin MD, [place]     Diabetes mellitus type 2 in obese ([place]) 06/28/1994   Dyspnea    uses oxygeb 2 liters per minute at dialysis   End stage renal disease on dialysis [facility])    on hemodialysis T/Th/Sat   Erosive esophagitis    Esophageal reflux    Eye redness    Gastroparesis    ? diabetic   GERD (gastroesophageal reflux disease)    History of blood transfusion 2019   Human immunodeficiency virus (HIV) disease ([place]) 04/23/2016   Hyperlipidemia    Hypertension    Metabolic bone disease 21/30/8657   Moderate nonproliferative diabetic retinopathy of both eyes ([place]) 11/21/2014   11/14/14: Noted on retinal imaging; needs follow-up imaging in 6 months  05/22/16: Noted on retinal imaging again; needs follow-up imaging in 6 months   PAD (peripheral artery disease) ([place])    Type 2 diabetes mellitus with diabetic peripheral angiopathy without gangrene ([facility]) 05/01/2019   Wears dentures    lower   Wound infection s/p L transmetatarsal amputation     Past Surgical History:  Procedure Laterality Date   ABDOMINAL AORTOGRAM W/LOWER EXTREMITY N/A 12/13/2020   Procedure: ABDOMINAL AORTOGRAM W/LOWER EXTREMITY;  Surgeon: Angelia Mould, MD;  Location: [place] [facility];  Service: Cardiovascular;  Laterality: N/A;   AMPUTATION Left 07/08/2018   Procedure: AMPUTATION FORTH RAY LEFT FOOT;  Surgeon: [name], MD;  Location: [place];  Service: Orthopedics;  Laterality: Left;   AMPUTATION Left 08/09/2018   Procedure: Left Transmetatarsal Amputation;  Surgeon: [name], MD;  Location: [place];  Service: Orthopedics;  Laterality: Left;   AMPUTATION Left 10/08/2018   Procedure: LEFT BELOW KNEE AMPUTATION;  Surgeon: [name], MD;  Location: [place];  Service: Orthopedics;  Laterality: Left;   AMPUTATION Left 10/28/2018   Procedure: REVISION BELOW KNEE AMPUTATION;  Surgeon: [name], MD;  Location: [place];  Service: Orthopedics;  Laterality: Left;   AMPUTATION Left 12/16/2018   Procedure: LEFT ABOVE KNEE AMPUTATION;  Surgeon: [name], MD;  Location: [place];  Service: Orthopedics;  Laterality: Left;   AMPUTATION Right 01/31/2021   Procedure: AMPUTATION BELOW KNEE;  Surgeon: Serafina Mitchell, MD;  Location: [facility] OR;  Service: Vascular;  Laterality: Right;   AMPUTATION Right 03/28/2021   Procedure: REVISION OF RIGHT BELOW KNEE AMPUTATION;  Surgeon: Serafina Mitchell, MD;  Location: [place];  Service: Vascular;  Laterality: Right;   AMPUTATION Right 04/18/2021   Procedure: right below knee amputation/washout placement wound vac;  Surgeon: Cherre Robins, MD;  Location: [place];  Service: Vascular;  Laterality: Right;   AV FISTULA PLACEMENT Left 10/14/2018   Procedure: Arteriovenous (Av) Fistula Creation Left Arm;  Surgeon: [name], MD;  Location: [place];  Service: Vascular;  Laterality: Left;   [place] Left 04/14/2019   Procedure: BASILIC VEIN TRANSPOSITION SECOND STAGE LEFT ARM;  Surgeon: Rosetta Posner, MD;  Location: [place];  Service: Vascular;  Laterality: Left;   CORONARY STENT INTERVENTION N/A 05/21/2021   Procedure: CORONARY STENT INTERVENTION;  Surgeon: Leonie Man, MD;  Location: [place] [facility];  Service:  Cardiovascular;  Laterality: N/A;   INCISION AND DRAINAGE ABSCESS N/A 10/24/2020   Procedure: exicision of hydradenitis;  Surgeon: Leighton Ruff, MD;  Location: [place];  Service: General;  Laterality: N/A;  45 min   INSERTION OF DIALYSIS CATHETER Right 04/14/2019   Procedure: INSERTION OF DIALYSIS CATHETER;  Surgeon: Rosetta Posner, MD;  Location: [facility];  Service: Vascular;  Laterality: Right;   LEFT HEART CATH AND CORONARY ANGIOGRAPHY N/A 05/21/2021   Procedure: LEFT HEART CATH AND CORONARY ANGIOGRAPHY;  Surgeon: Leonie Man, MD;  Location: [place] [facility];  Service: Cardiovascular;  Laterality: N/A;   LOWER EXTREMITY ANGIOGRAPHY N/A 07/05/2018   Procedure: LOWER EXTREMITY ANGIOGRAPHY;  Surgeon: Serafina Mitchell, MD;  Location: [place] [facility];  Service: Cardiovascular;  Laterality: N/A;   PERIPHERAL VASCULAR ATHERECTOMY Right 12/13/2020   Procedure: PERIPHERAL VASCULAR ATHERECTOMY;  Surgeon: Angelia Mould, MD;  Location: [place] [facility];  Service: Cardiovascular;  Laterality: Right;  Superficial femoral   PERIPHERAL VASCULAR BALLOON ANGIOPLASTY Left 07/05/2018   Procedure: PERIPHERAL VASCULAR BALLOON ANGIOPLASTY;  Surgeon: Serafina Mitchell, MD;  Location: [place] [facility];  Service: Cardiovascular;  Laterality: Left;  SFA   PERIPHERAL VASCULAR BALLOON ANGIOPLASTY Right 12/13/2020   Procedure: PERIPHERAL VASCULAR BALLOON ANGIOPLASTY;  Surgeon: Angelia Mould, MD;  Location: [place] [facility];  Service: Cardiovascular;  Laterality: Right;  Peroneal artery, anterior tibial artery   RECTAL EXAM UNDER ANESTHESIA N/A 10/24/2020   Procedure: EXAM UNDER ANESTHESIA;  Surgeon: Leighton Ruff, MD;  Location[facility]  Service: General;  Laterality: N/A;   STUMP REVISION Left 10/19/2018   Procedure: REVISION LEFT BELOW KNEE AMPUTATION;  Surgeon: [name], MD;  Location: [place];  Service: Orthopedics;  Laterality: Left;   TUBAL LIGATION   2002    ROS:   General:  No weight loss, Fever, chills  HEENT: No recent headaches, no nasal bleeding, no visual changes, no sore throat  Neurologic: No dizziness, blackouts, seizures. No recent symptoms of stroke or mini- stroke. No recent episodes of slurred speech, or temporary blindness.  Cardiac: No recent episodes of chest pain/pressure, no shortness of breath at rest.  No shortness of breath with exertion.  Denies history of atrial fibrillation or irregular heartbeat  Vascular: No history of rest pain in feet.  No history of claudication.  No history of non-healing ulcer, No history of DVT   Pulmonary: No home oxygen, no productive cough, no hemoptysis,  No asthma or wheezing  Musculoskeletal:  [ ]  Arthritis, [ ]  Low back pain,  [ ]  Joint pain  Hematologic:No history of hypercoagulable state.  No history of easy bleeding.  No history of anemia  Gastrointestinal: No hematochezia or melena,  No gastroesophageal reflux, no trouble swallowing  Urinary: [ ]  chronic Kidney disease, [ ]  on HD - [ ]  MWF or [ ]  TTHS, [ ]  Burning with  urination, [ ]  Frequent urination, [ ]  Difficulty urinating;   Skin: No rashes  Psychological: No history of anxiety,  No history of depression  Social History Social History   Tobacco Use   Smoking status: Former    Packs/day: 0.10    Years: 10.00    Pack years: 1.00    Types: Cigarettes    Start date: 11/17/2013    Quit date: 09/09/2014    Years since quitting: 6.9   Smokeless tobacco: Never  Vaping Use   Vaping Use: Never used  Substance Use Topics   Alcohol use: Not Currently   Drug use: Not Currently    Types: Marijuana    Comment: 2 times a monthy -  `last time Juily 2022    Family History Family History  Problem Relation Age of Onset   Diabetes Mother    Diabetes Brother    Diabetes Daughter    Diabetes Daughter    Mental retardation Brother        died from PNA   Diabetes Maternal Grandmother     Allergies  No Known  Allergies   Current Outpatient Medications  Medication Sig Dispense Refill   Accu-Chek Softclix Lancets lancets Use as instructed 100 each 12   albuterol (VENTOLIN HFA) 108 (90 Base) MCG/ACT inhaler Inhale 2 puffs into the lungs every 4 (four) hours as needed for wheezing or shortness of breath. 8.5 g 0   amLODipine (NORVASC) 10 MG tablet Take 1 tablet (10 mg total) by mouth daily. 90 tablet 3   aspirin EC 81 MG tablet Take 1 tablet (81 mg total) by mouth daily. Swallow whole. 90 tablet 3   atorvastatin (LIPITOR) 80 MG tablet Take 1 tablet (80 mg total) by mouth daily. 30 tablet 11   bictegravir-emtricitabine-tenofovir AF (BIKTARVY) 50-200-25 MG TABS tablet TAKE 1 TABLET BY MOUTH DAILY. 30 tablet 5   calcitRIOL (ROCALTROL) 0.25 MCG capsule Take 1 capsule (0.25 mcg total) by mouth Every Tuesday,Thursday,and Saturday with dialysis. 30 capsule 0   calcium acetate (PHOSLO) 667 MG capsule Take 3 capsules (2,001 mg total) by mouth 3 (three) times daily with meals. 180 capsule 0   clopidogrel (PLAVIX) 75 MG tablet Take 1 tablet (75 mg total) by mouth daily. 90 tablet 3   cyclobenzaprine (FLEXERIL) 5 MG tablet Take 1 tablet (5 mg total) by mouth 2 (two) times daily as needed for muscle spasms. 15 tablet 0   ethyl chloride spray Apply topically.     glucose blood test strip Use as instructed 100 each 12   hydrALAZINE (APRESOLINE) 25 MG tablet Take 3 tablets (75 mg total) by mouth 3 (three) times daily. 90 tablet 11   insulin glargine (LANTUS SOLOSTAR) 100 UNIT/ML Solostar Pen Inject 8 Units into the skin at bedtime. (Patient taking differently: Inject 10 Units into the skin at bedtime.) 15 mL 11   Insulin Pen Needle (PENTIPS) 32G X 4 MM MISC Use as directed 100 each 3   multivitamin (RENA-VIT) TABS tablet Take 1 tablet by mouth daily. 30 tablet 0   polyethylene glycol (MIRALAX / GLYCOLAX) 17 g packet Take 17 g by mouth 2 (two) times daily. (Patient taking differently: Take 17 g by mouth daily as needed  for mild constipation or moderate constipation.) 14 each 0   propranolol (INDERAL) 20 MG tablet Take 1 tablet (20 mg total) by mouth every 8 (eight) hours. 270 tablet 3   gabapentin (NEURONTIN) 100 MG capsule Take 1-3 capsules (100-300 mg total) by mouth See  admin instructions. Take 100 mg on Monday, Wednesday, Friday and Sunday (Patient taking differently: Take 100 mg by mouth See admin instructions. Take 100 mg on Monday, Wednesday, Friday and Sunday) 30 capsule 2   liraglutide (VICTOZA) 18 MG/3ML SOPN Inject 0.6 mg into the skin daily. 3 mL 0   pantoprazole (PROTONIX) 40 MG tablet Take 1 tablet (40 mg total) by mouth daily. (Patient not taking: Reported on 09/05/2021) 30 tablet 0   sertraline (ZOLOFT) 50 MG tablet Take 1 tablet (50 mg total) by mouth at bedtime. (Patient not taking: Reported on 09/05/2021) 30 tablet 0   No current facility-administered medications for this visit.    Physical Examination  Vitals:   09/05/21 1540  BP: (!) 152/84  Pulse: 90  Temp: 98.7 F (37.1 C)  SpO2: 100%  Height: 5\' 5"  (7.048 m)    Body mass index is 24.79 kg/m.  General:  Alert and oriented, no acute distress HEENT: Normal Neck: No bruit or JVD Pulmonary: Clear to auscultation bilaterally Cardiac: Regular Rate and Rhythm without murmur Abdomen: Soft, non-tender, non-distended, no mass, no scars Skin: No rash Extremity Pulses:  Bilateral stumps warm well healed and viable   Musculoskeletal: No deformity or edema  Neurologic: Upper and lower extremity motor grossly intact and symmetric     ASSESSMENT:  PAD Bilateral LE resulting in left AKA and right BKA.  I will schedule her a f/u with [facility] for stump socks and preporation for right BKA prothesis for balance, transfers and independent living.    The patient has a bilateral Below Knee Amputation right LE, left AKA. The patient is well motivated to return to their prior functional status by utilizing a prosthesis to perform ADL's and  maintain a healthy lifestyle. The patient has the physical and cognitive capacity to function with a prosthesis.   Functional Level: K1 Household Ambulator: Has the ability or potential to use prosthesis for transfers/ambulation on level surfaces at a fixed cadence with assistive device.  Residual Limb History: The skin condition of the residual limb is well healed. The patient will continue to monitor the skin of the residual limb and follow hygiene instructions.  The patient is experiencing no pain related to amputation  Prosthetic Prescription Plan: Counseling and education regarding prosthetic management will be provided to the patient via a certified prosthetist. A multi-discipline team, including physical therapy, will manage the prosthetic fabrication, fitting and prosthetic gait training.     PLAN: She will f/u PRN   Roxy Horseman PA-C [facility] [place] Office: [phone]  [place] in clinic [place]

## 2021-09-06 DIAGNOSIS — D689 Coagulation defect, unspecified: Secondary | ICD-10-CM | POA: Diagnosis not present

## 2021-09-06 DIAGNOSIS — N2581 Secondary hyperparathyroidism of renal origin: Secondary | ICD-10-CM | POA: Diagnosis not present

## 2021-09-06 DIAGNOSIS — D631 Anemia in chronic kidney disease: Secondary | ICD-10-CM | POA: Diagnosis not present

## 2021-09-06 DIAGNOSIS — R519 Headache, unspecified: Secondary | ICD-10-CM | POA: Diagnosis not present

## 2021-09-06 DIAGNOSIS — Z992 Dependence on renal dialysis: Secondary | ICD-10-CM | POA: Diagnosis not present

## 2021-09-06 DIAGNOSIS — N186 End stage renal disease: Secondary | ICD-10-CM | POA: Diagnosis not present

## 2021-09-06 DIAGNOSIS — E1110 Type 2 diabetes mellitus with ketoacidosis without coma: Secondary | ICD-10-CM | POA: Diagnosis not present

## 2021-09-06 DIAGNOSIS — L299 Pruritus, unspecified: Secondary | ICD-10-CM | POA: Diagnosis not present

## 2021-09-09 DIAGNOSIS — L299 Pruritus, unspecified: Secondary | ICD-10-CM | POA: Diagnosis not present

## 2021-09-09 DIAGNOSIS — D689 Coagulation defect, unspecified: Secondary | ICD-10-CM | POA: Diagnosis not present

## 2021-09-09 DIAGNOSIS — Z992 Dependence on renal dialysis: Secondary | ICD-10-CM | POA: Diagnosis not present

## 2021-09-09 DIAGNOSIS — R519 Headache, unspecified: Secondary | ICD-10-CM | POA: Diagnosis not present

## 2021-09-09 DIAGNOSIS — E1110 Type 2 diabetes mellitus with ketoacidosis without coma: Secondary | ICD-10-CM | POA: Diagnosis not present

## 2021-09-09 DIAGNOSIS — E1129 Type 2 diabetes mellitus with other diabetic kidney complication: Secondary | ICD-10-CM | POA: Diagnosis not present

## 2021-09-09 DIAGNOSIS — D631 Anemia in chronic kidney disease: Secondary | ICD-10-CM | POA: Diagnosis not present

## 2021-09-09 DIAGNOSIS — N2581 Secondary hyperparathyroidism of renal origin: Secondary | ICD-10-CM | POA: Diagnosis not present

## 2021-09-09 DIAGNOSIS — N186 End stage renal disease: Secondary | ICD-10-CM | POA: Diagnosis not present

## 2021-09-11 DIAGNOSIS — I120 Hypertensive chronic kidney disease with stage 5 chronic kidney disease or end stage renal disease: Secondary | ICD-10-CM | POA: Diagnosis not present

## 2021-09-11 DIAGNOSIS — Z992 Dependence on renal dialysis: Secondary | ICD-10-CM | POA: Diagnosis not present

## 2021-09-11 DIAGNOSIS — N186 End stage renal disease: Secondary | ICD-10-CM | POA: Diagnosis not present

## 2021-09-11 DIAGNOSIS — D689 Coagulation defect, unspecified: Secondary | ICD-10-CM | POA: Diagnosis not present

## 2021-09-11 DIAGNOSIS — D631 Anemia in chronic kidney disease: Secondary | ICD-10-CM | POA: Diagnosis not present

## 2021-09-11 DIAGNOSIS — R197 Diarrhea, unspecified: Secondary | ICD-10-CM | POA: Diagnosis not present

## 2021-09-11 DIAGNOSIS — L299 Pruritus, unspecified: Secondary | ICD-10-CM | POA: Diagnosis not present

## 2021-09-11 DIAGNOSIS — N2581 Secondary hyperparathyroidism of renal origin: Secondary | ICD-10-CM | POA: Diagnosis not present

## 2021-09-13 DIAGNOSIS — L299 Pruritus, unspecified: Secondary | ICD-10-CM | POA: Diagnosis not present

## 2021-09-13 DIAGNOSIS — N2581 Secondary hyperparathyroidism of renal origin: Secondary | ICD-10-CM | POA: Diagnosis not present

## 2021-09-13 DIAGNOSIS — Z992 Dependence on renal dialysis: Secondary | ICD-10-CM | POA: Diagnosis not present

## 2021-09-13 DIAGNOSIS — D631 Anemia in chronic kidney disease: Secondary | ICD-10-CM | POA: Diagnosis not present

## 2021-09-13 DIAGNOSIS — N186 End stage renal disease: Secondary | ICD-10-CM | POA: Diagnosis not present

## 2021-09-13 DIAGNOSIS — I120 Hypertensive chronic kidney disease with stage 5 chronic kidney disease or end stage renal disease: Secondary | ICD-10-CM | POA: Diagnosis not present

## 2021-09-13 DIAGNOSIS — R197 Diarrhea, unspecified: Secondary | ICD-10-CM | POA: Diagnosis not present

## 2021-09-13 DIAGNOSIS — D689 Coagulation defect, unspecified: Secondary | ICD-10-CM | POA: Diagnosis not present

## 2021-09-15 ENCOUNTER — Other Ambulatory Visit (HOSPITAL_COMMUNITY): Payer: Self-pay

## 2021-09-16 DIAGNOSIS — Z992 Dependence on renal dialysis: Secondary | ICD-10-CM | POA: Diagnosis not present

## 2021-09-16 DIAGNOSIS — N2581 Secondary hyperparathyroidism of renal origin: Secondary | ICD-10-CM | POA: Diagnosis not present

## 2021-09-16 DIAGNOSIS — D689 Coagulation defect, unspecified: Secondary | ICD-10-CM | POA: Diagnosis not present

## 2021-09-16 DIAGNOSIS — I120 Hypertensive chronic kidney disease with stage 5 chronic kidney disease or end stage renal disease: Secondary | ICD-10-CM | POA: Diagnosis not present

## 2021-09-16 DIAGNOSIS — D631 Anemia in chronic kidney disease: Secondary | ICD-10-CM | POA: Diagnosis not present

## 2021-09-16 DIAGNOSIS — R197 Diarrhea, unspecified: Secondary | ICD-10-CM | POA: Diagnosis not present

## 2021-09-16 DIAGNOSIS — L299 Pruritus, unspecified: Secondary | ICD-10-CM | POA: Diagnosis not present

## 2021-09-16 DIAGNOSIS — N186 End stage renal disease: Secondary | ICD-10-CM | POA: Diagnosis not present

## 2021-09-17 ENCOUNTER — Other Ambulatory Visit (HOSPITAL_COMMUNITY): Payer: Self-pay

## 2021-09-17 ENCOUNTER — Ambulatory Visit: Payer: Medicaid Other | Admitting: Cardiology

## 2021-09-17 DIAGNOSIS — E1169 Type 2 diabetes mellitus with other specified complication: Secondary | ICD-10-CM | POA: Diagnosis not present

## 2021-09-17 DIAGNOSIS — Z89612 Acquired absence of left leg above knee: Secondary | ICD-10-CM | POA: Diagnosis not present

## 2021-09-17 DIAGNOSIS — Z89511 Acquired absence of right leg below knee: Secondary | ICD-10-CM | POA: Diagnosis not present

## 2021-09-17 DIAGNOSIS — N186 End stage renal disease: Secondary | ICD-10-CM | POA: Diagnosis not present

## 2021-09-18 DIAGNOSIS — D689 Coagulation defect, unspecified: Secondary | ICD-10-CM | POA: Diagnosis not present

## 2021-09-18 DIAGNOSIS — Z992 Dependence on renal dialysis: Secondary | ICD-10-CM | POA: Diagnosis not present

## 2021-09-18 DIAGNOSIS — D631 Anemia in chronic kidney disease: Secondary | ICD-10-CM | POA: Diagnosis not present

## 2021-09-18 DIAGNOSIS — R197 Diarrhea, unspecified: Secondary | ICD-10-CM | POA: Diagnosis not present

## 2021-09-18 DIAGNOSIS — N186 End stage renal disease: Secondary | ICD-10-CM | POA: Diagnosis not present

## 2021-09-18 DIAGNOSIS — N2581 Secondary hyperparathyroidism of renal origin: Secondary | ICD-10-CM | POA: Diagnosis not present

## 2021-09-18 DIAGNOSIS — I120 Hypertensive chronic kidney disease with stage 5 chronic kidney disease or end stage renal disease: Secondary | ICD-10-CM | POA: Diagnosis not present

## 2021-09-18 DIAGNOSIS — L299 Pruritus, unspecified: Secondary | ICD-10-CM | POA: Diagnosis not present

## 2021-09-20 DIAGNOSIS — L299 Pruritus, unspecified: Secondary | ICD-10-CM | POA: Diagnosis not present

## 2021-09-20 DIAGNOSIS — I120 Hypertensive chronic kidney disease with stage 5 chronic kidney disease or end stage renal disease: Secondary | ICD-10-CM | POA: Diagnosis not present

## 2021-09-20 DIAGNOSIS — N2581 Secondary hyperparathyroidism of renal origin: Secondary | ICD-10-CM | POA: Diagnosis not present

## 2021-09-20 DIAGNOSIS — N186 End stage renal disease: Secondary | ICD-10-CM | POA: Diagnosis not present

## 2021-09-20 DIAGNOSIS — Z992 Dependence on renal dialysis: Secondary | ICD-10-CM | POA: Diagnosis not present

## 2021-09-20 DIAGNOSIS — D631 Anemia in chronic kidney disease: Secondary | ICD-10-CM | POA: Diagnosis not present

## 2021-09-20 DIAGNOSIS — D689 Coagulation defect, unspecified: Secondary | ICD-10-CM | POA: Diagnosis not present

## 2021-09-20 DIAGNOSIS — R197 Diarrhea, unspecified: Secondary | ICD-10-CM | POA: Diagnosis not present

## 2021-09-23 DIAGNOSIS — R197 Diarrhea, unspecified: Secondary | ICD-10-CM | POA: Diagnosis not present

## 2021-09-23 DIAGNOSIS — L299 Pruritus, unspecified: Secondary | ICD-10-CM | POA: Diagnosis not present

## 2021-09-23 DIAGNOSIS — D631 Anemia in chronic kidney disease: Secondary | ICD-10-CM | POA: Diagnosis not present

## 2021-09-23 DIAGNOSIS — I120 Hypertensive chronic kidney disease with stage 5 chronic kidney disease or end stage renal disease: Secondary | ICD-10-CM | POA: Diagnosis not present

## 2021-09-23 DIAGNOSIS — Z992 Dependence on renal dialysis: Secondary | ICD-10-CM | POA: Diagnosis not present

## 2021-09-23 DIAGNOSIS — N2581 Secondary hyperparathyroidism of renal origin: Secondary | ICD-10-CM | POA: Diagnosis not present

## 2021-09-23 DIAGNOSIS — N186 End stage renal disease: Secondary | ICD-10-CM | POA: Diagnosis not present

## 2021-09-23 DIAGNOSIS — D689 Coagulation defect, unspecified: Secondary | ICD-10-CM | POA: Diagnosis not present

## 2021-09-25 DIAGNOSIS — I120 Hypertensive chronic kidney disease with stage 5 chronic kidney disease or end stage renal disease: Secondary | ICD-10-CM | POA: Diagnosis not present

## 2021-09-25 DIAGNOSIS — N2581 Secondary hyperparathyroidism of renal origin: Secondary | ICD-10-CM | POA: Diagnosis not present

## 2021-09-25 DIAGNOSIS — R197 Diarrhea, unspecified: Secondary | ICD-10-CM | POA: Diagnosis not present

## 2021-09-25 DIAGNOSIS — D631 Anemia in chronic kidney disease: Secondary | ICD-10-CM | POA: Diagnosis not present

## 2021-09-25 DIAGNOSIS — L299 Pruritus, unspecified: Secondary | ICD-10-CM | POA: Diagnosis not present

## 2021-09-25 DIAGNOSIS — Z992 Dependence on renal dialysis: Secondary | ICD-10-CM | POA: Diagnosis not present

## 2021-09-25 DIAGNOSIS — D689 Coagulation defect, unspecified: Secondary | ICD-10-CM | POA: Diagnosis not present

## 2021-09-25 DIAGNOSIS — N186 End stage renal disease: Secondary | ICD-10-CM | POA: Diagnosis not present

## 2021-09-27 DIAGNOSIS — Z992 Dependence on renal dialysis: Secondary | ICD-10-CM | POA: Diagnosis not present

## 2021-09-27 DIAGNOSIS — R197 Diarrhea, unspecified: Secondary | ICD-10-CM | POA: Diagnosis not present

## 2021-09-27 DIAGNOSIS — N186 End stage renal disease: Secondary | ICD-10-CM | POA: Diagnosis not present

## 2021-09-27 DIAGNOSIS — D631 Anemia in chronic kidney disease: Secondary | ICD-10-CM | POA: Diagnosis not present

## 2021-09-27 DIAGNOSIS — I120 Hypertensive chronic kidney disease with stage 5 chronic kidney disease or end stage renal disease: Secondary | ICD-10-CM | POA: Diagnosis not present

## 2021-09-27 DIAGNOSIS — D689 Coagulation defect, unspecified: Secondary | ICD-10-CM | POA: Diagnosis not present

## 2021-09-27 DIAGNOSIS — N2581 Secondary hyperparathyroidism of renal origin: Secondary | ICD-10-CM | POA: Diagnosis not present

## 2021-09-27 DIAGNOSIS — L299 Pruritus, unspecified: Secondary | ICD-10-CM | POA: Diagnosis not present

## 2021-09-29 ENCOUNTER — Telehealth: Payer: Self-pay | Admitting: *Deleted

## 2021-09-29 NOTE — Telephone Encounter (Signed)
This email is to notify you that the patient listed below had an early fall off issue where the patch fell off in the first 24 hours of wear.  We have placed an order for a replacement device to be overnighted to the patient. We have placed a hold on the billing, so the patient is not charged for the first device.  If you have any questions, please respond to this email, or call us at [phone].   Patient initials:  S.S.   Patient ID: [id number]  SN: [id number]  Ticket: [id number]   Account: [place]   Thank you,   [place]

## 2021-10-02 DIAGNOSIS — R197 Diarrhea, unspecified: Secondary | ICD-10-CM | POA: Diagnosis not present

## 2021-10-02 DIAGNOSIS — L299 Pruritus, unspecified: Secondary | ICD-10-CM | POA: Diagnosis not present

## 2021-10-02 DIAGNOSIS — D631 Anemia in chronic kidney disease: Secondary | ICD-10-CM | POA: Diagnosis not present

## 2021-10-02 DIAGNOSIS — I120 Hypertensive chronic kidney disease with stage 5 chronic kidney disease or end stage renal disease: Secondary | ICD-10-CM | POA: Diagnosis not present

## 2021-10-02 DIAGNOSIS — Z992 Dependence on renal dialysis: Secondary | ICD-10-CM | POA: Diagnosis not present

## 2021-10-02 DIAGNOSIS — D689 Coagulation defect, unspecified: Secondary | ICD-10-CM | POA: Diagnosis not present

## 2021-10-02 DIAGNOSIS — N2581 Secondary hyperparathyroidism of renal origin: Secondary | ICD-10-CM | POA: Diagnosis not present

## 2021-10-02 DIAGNOSIS — N186 End stage renal disease: Secondary | ICD-10-CM | POA: Diagnosis not present

## 2021-10-04 DIAGNOSIS — Z992 Dependence on renal dialysis: Secondary | ICD-10-CM | POA: Diagnosis not present

## 2021-10-04 DIAGNOSIS — D631 Anemia in chronic kidney disease: Secondary | ICD-10-CM | POA: Diagnosis not present

## 2021-10-04 DIAGNOSIS — R197 Diarrhea, unspecified: Secondary | ICD-10-CM | POA: Diagnosis not present

## 2021-10-04 DIAGNOSIS — L299 Pruritus, unspecified: Secondary | ICD-10-CM | POA: Diagnosis not present

## 2021-10-04 DIAGNOSIS — N2581 Secondary hyperparathyroidism of renal origin: Secondary | ICD-10-CM | POA: Diagnosis not present

## 2021-10-04 DIAGNOSIS — I120 Hypertensive chronic kidney disease with stage 5 chronic kidney disease or end stage renal disease: Secondary | ICD-10-CM | POA: Diagnosis not present

## 2021-10-04 DIAGNOSIS — D689 Coagulation defect, unspecified: Secondary | ICD-10-CM | POA: Diagnosis not present

## 2021-10-04 DIAGNOSIS — N186 End stage renal disease: Secondary | ICD-10-CM | POA: Diagnosis not present

## 2021-10-07 DIAGNOSIS — E1129 Type 2 diabetes mellitus with other diabetic kidney complication: Secondary | ICD-10-CM | POA: Diagnosis not present

## 2021-10-07 DIAGNOSIS — Z992 Dependence on renal dialysis: Secondary | ICD-10-CM | POA: Diagnosis not present

## 2021-10-07 DIAGNOSIS — D631 Anemia in chronic kidney disease: Secondary | ICD-10-CM | POA: Diagnosis not present

## 2021-10-07 DIAGNOSIS — N186 End stage renal disease: Secondary | ICD-10-CM | POA: Diagnosis not present

## 2021-10-07 DIAGNOSIS — N2581 Secondary hyperparathyroidism of renal origin: Secondary | ICD-10-CM | POA: Diagnosis not present

## 2021-10-07 DIAGNOSIS — R197 Diarrhea, unspecified: Secondary | ICD-10-CM | POA: Diagnosis not present

## 2021-10-07 DIAGNOSIS — I120 Hypertensive chronic kidney disease with stage 5 chronic kidney disease or end stage renal disease: Secondary | ICD-10-CM | POA: Diagnosis not present

## 2021-10-07 DIAGNOSIS — D689 Coagulation defect, unspecified: Secondary | ICD-10-CM | POA: Diagnosis not present

## 2021-10-07 DIAGNOSIS — L299 Pruritus, unspecified: Secondary | ICD-10-CM | POA: Diagnosis not present

## 2021-10-09 DIAGNOSIS — R197 Diarrhea, unspecified: Secondary | ICD-10-CM | POA: Diagnosis not present

## 2021-10-09 DIAGNOSIS — I120 Hypertensive chronic kidney disease with stage 5 chronic kidney disease or end stage renal disease: Secondary | ICD-10-CM | POA: Diagnosis not present

## 2021-10-09 DIAGNOSIS — N2581 Secondary hyperparathyroidism of renal origin: Secondary | ICD-10-CM | POA: Diagnosis not present

## 2021-10-09 DIAGNOSIS — N186 End stage renal disease: Secondary | ICD-10-CM | POA: Diagnosis not present

## 2021-10-09 DIAGNOSIS — D689 Coagulation defect, unspecified: Secondary | ICD-10-CM | POA: Diagnosis not present

## 2021-10-09 DIAGNOSIS — Z992 Dependence on renal dialysis: Secondary | ICD-10-CM | POA: Diagnosis not present

## 2021-10-09 DIAGNOSIS — L299 Pruritus, unspecified: Secondary | ICD-10-CM | POA: Diagnosis not present

## 2021-10-09 DIAGNOSIS — R519 Headache, unspecified: Secondary | ICD-10-CM | POA: Diagnosis not present

## 2021-10-10 ENCOUNTER — Other Ambulatory Visit (HOSPITAL_COMMUNITY): Payer: Self-pay

## 2021-10-11 DIAGNOSIS — D689 Coagulation defect, unspecified: Secondary | ICD-10-CM | POA: Diagnosis not present

## 2021-10-11 DIAGNOSIS — I120 Hypertensive chronic kidney disease with stage 5 chronic kidney disease or end stage renal disease: Secondary | ICD-10-CM | POA: Diagnosis not present

## 2021-10-11 DIAGNOSIS — N2581 Secondary hyperparathyroidism of renal origin: Secondary | ICD-10-CM | POA: Diagnosis not present

## 2021-10-11 DIAGNOSIS — N186 End stage renal disease: Secondary | ICD-10-CM | POA: Diagnosis not present

## 2021-10-11 DIAGNOSIS — R519 Headache, unspecified: Secondary | ICD-10-CM | POA: Diagnosis not present

## 2021-10-11 DIAGNOSIS — L299 Pruritus, unspecified: Secondary | ICD-10-CM | POA: Diagnosis not present

## 2021-10-11 DIAGNOSIS — R197 Diarrhea, unspecified: Secondary | ICD-10-CM | POA: Diagnosis not present

## 2021-10-11 DIAGNOSIS — Z992 Dependence on renal dialysis: Secondary | ICD-10-CM | POA: Diagnosis not present

## 2021-10-13 ENCOUNTER — Other Ambulatory Visit (HOSPITAL_COMMUNITY): Payer: Self-pay

## 2021-10-14 DIAGNOSIS — R197 Diarrhea, unspecified: Secondary | ICD-10-CM | POA: Diagnosis not present

## 2021-10-14 DIAGNOSIS — D689 Coagulation defect, unspecified: Secondary | ICD-10-CM | POA: Diagnosis not present

## 2021-10-14 DIAGNOSIS — N2581 Secondary hyperparathyroidism of renal origin: Secondary | ICD-10-CM | POA: Diagnosis not present

## 2021-10-14 DIAGNOSIS — I120 Hypertensive chronic kidney disease with stage 5 chronic kidney disease or end stage renal disease: Secondary | ICD-10-CM | POA: Diagnosis not present

## 2021-10-14 DIAGNOSIS — R519 Headache, unspecified: Secondary | ICD-10-CM | POA: Diagnosis not present

## 2021-10-14 DIAGNOSIS — N186 End stage renal disease: Secondary | ICD-10-CM | POA: Diagnosis not present

## 2021-10-14 DIAGNOSIS — Z992 Dependence on renal dialysis: Secondary | ICD-10-CM | POA: Diagnosis not present

## 2021-10-14 DIAGNOSIS — L299 Pruritus, unspecified: Secondary | ICD-10-CM | POA: Diagnosis not present

## 2021-10-18 DIAGNOSIS — N186 End stage renal disease: Secondary | ICD-10-CM | POA: Diagnosis not present

## 2021-10-18 DIAGNOSIS — I120 Hypertensive chronic kidney disease with stage 5 chronic kidney disease or end stage renal disease: Secondary | ICD-10-CM | POA: Diagnosis not present

## 2021-10-18 DIAGNOSIS — R197 Diarrhea, unspecified: Secondary | ICD-10-CM | POA: Diagnosis not present

## 2021-10-18 DIAGNOSIS — L299 Pruritus, unspecified: Secondary | ICD-10-CM | POA: Diagnosis not present

## 2021-10-18 DIAGNOSIS — R519 Headache, unspecified: Secondary | ICD-10-CM | POA: Diagnosis not present

## 2021-10-18 DIAGNOSIS — D689 Coagulation defect, unspecified: Secondary | ICD-10-CM | POA: Diagnosis not present

## 2021-10-18 DIAGNOSIS — Z992 Dependence on renal dialysis: Secondary | ICD-10-CM | POA: Diagnosis not present

## 2021-10-18 DIAGNOSIS — N2581 Secondary hyperparathyroidism of renal origin: Secondary | ICD-10-CM | POA: Diagnosis not present

## 2021-10-21 DIAGNOSIS — L299 Pruritus, unspecified: Secondary | ICD-10-CM | POA: Diagnosis not present

## 2021-10-21 DIAGNOSIS — N2581 Secondary hyperparathyroidism of renal origin: Secondary | ICD-10-CM | POA: Diagnosis not present

## 2021-10-21 DIAGNOSIS — Z992 Dependence on renal dialysis: Secondary | ICD-10-CM | POA: Diagnosis not present

## 2021-10-21 DIAGNOSIS — D689 Coagulation defect, unspecified: Secondary | ICD-10-CM | POA: Diagnosis not present

## 2021-10-21 DIAGNOSIS — R519 Headache, unspecified: Secondary | ICD-10-CM | POA: Diagnosis not present

## 2021-10-21 DIAGNOSIS — N186 End stage renal disease: Secondary | ICD-10-CM | POA: Diagnosis not present

## 2021-10-21 DIAGNOSIS — I120 Hypertensive chronic kidney disease with stage 5 chronic kidney disease or end stage renal disease: Secondary | ICD-10-CM | POA: Diagnosis not present

## 2021-10-21 DIAGNOSIS — R197 Diarrhea, unspecified: Secondary | ICD-10-CM | POA: Diagnosis not present

## 2021-10-23 DIAGNOSIS — N186 End stage renal disease: Secondary | ICD-10-CM | POA: Diagnosis not present

## 2021-10-23 DIAGNOSIS — N2581 Secondary hyperparathyroidism of renal origin: Secondary | ICD-10-CM | POA: Diagnosis not present

## 2021-10-23 DIAGNOSIS — Z992 Dependence on renal dialysis: Secondary | ICD-10-CM | POA: Diagnosis not present

## 2021-10-23 DIAGNOSIS — R519 Headache, unspecified: Secondary | ICD-10-CM | POA: Diagnosis not present

## 2021-10-23 DIAGNOSIS — I120 Hypertensive chronic kidney disease with stage 5 chronic kidney disease or end stage renal disease: Secondary | ICD-10-CM | POA: Diagnosis not present

## 2021-10-23 DIAGNOSIS — L299 Pruritus, unspecified: Secondary | ICD-10-CM | POA: Diagnosis not present

## 2021-10-23 DIAGNOSIS — R197 Diarrhea, unspecified: Secondary | ICD-10-CM | POA: Diagnosis not present

## 2021-10-23 DIAGNOSIS — D689 Coagulation defect, unspecified: Secondary | ICD-10-CM | POA: Diagnosis not present

## 2021-10-24 ENCOUNTER — Other Ambulatory Visit (HOSPITAL_COMMUNITY): Payer: Self-pay

## 2021-10-25 DIAGNOSIS — N2581 Secondary hyperparathyroidism of renal origin: Secondary | ICD-10-CM | POA: Diagnosis not present

## 2021-10-25 DIAGNOSIS — I120 Hypertensive chronic kidney disease with stage 5 chronic kidney disease or end stage renal disease: Secondary | ICD-10-CM | POA: Diagnosis not present

## 2021-10-25 DIAGNOSIS — Z992 Dependence on renal dialysis: Secondary | ICD-10-CM | POA: Diagnosis not present

## 2021-10-25 DIAGNOSIS — L299 Pruritus, unspecified: Secondary | ICD-10-CM | POA: Diagnosis not present

## 2021-10-25 DIAGNOSIS — R197 Diarrhea, unspecified: Secondary | ICD-10-CM | POA: Diagnosis not present

## 2021-10-25 DIAGNOSIS — D689 Coagulation defect, unspecified: Secondary | ICD-10-CM | POA: Diagnosis not present

## 2021-10-25 DIAGNOSIS — R519 Headache, unspecified: Secondary | ICD-10-CM | POA: Diagnosis not present

## 2021-10-25 DIAGNOSIS — N186 End stage renal disease: Secondary | ICD-10-CM | POA: Diagnosis not present

## 2021-10-27 ENCOUNTER — Ambulatory Visit (INDEPENDENT_AMBULATORY_CARE_PROVIDER_SITE_OTHER): Payer: Medicare Other | Admitting: Student

## 2021-10-27 ENCOUNTER — Other Ambulatory Visit: Payer: Self-pay

## 2021-10-27 DIAGNOSIS — Z741 Need for assistance with personal care: Secondary | ICD-10-CM

## 2021-10-27 NOTE — Progress Notes (Signed)
? ?  CC: Discuss need for PCS services ? ?This is a telephone encounter between Kathy Frank and Kathy Frank on 10/27/2021 to discuss her ongoing needs for assistance with ADL's 2/2 bilateral lower extremity amputations. The visit was conducted with the patient located at home and Kathy Frank at [facility]. The patient's identity was confirmed using their DOB and current address. The patient has consented to being evaluated through a telephone encounter and understands the associated risks (an examination cannot be done and the patient may need to come in for an appointment) / benefits (allows the patient to remain at home, decreasing exposure to coronavirus). I personally spent 8 minutes on medical discussion.  ? ?HPI: ? ?Kathy Frank is a 46 y.o. with PMH as below.  ? ?Please see A&P for assessment of the patient's acute and chronic medical conditions.  ? ?Past Medical History:  ?Diagnosis Date  ? Anal abscess   ? chronic  ? Anxiety   ? CAD (coronary artery disease)   ? CVA (cerebral vascular accident) [facility])   ? Depression 06/28/2006  ? Qualifier: Diagnosis of  By: Riccardo Dubin MD, Sherren Mocha    ? Diabetes mellitus type 2 in obese ([place]) 06/28/1994  ? Dyspnea   ? uses oxygeb 2 liters per minute at dialysis  ? End stage renal disease on dialysis [facility])   ? on hemodialysis T/Th/Sat  ? Erosive esophagitis   ? Esophageal reflux   ? Eye redness   ? Gastroparesis   ? ? diabetic  ? GERD (gastroesophageal reflux disease)   ? History of blood transfusion 2019  ? Human immunodeficiency virus (HIV) disease ([place]) 04/23/2016  ? Hyperlipidemia   ? Hypertension   ? Metabolic bone disease 29/51/8841  ? Moderate nonproliferative diabetic retinopathy of both eyes ([place]) 11/21/2014  ? 11/14/14: Noted on retinal imaging; needs follow-up imaging in 6 months  05/22/16: Noted on retinal imaging again; needs follow-up imaging in 6 months  ? PAD (peripheral artery disease) ([place])   ? Type 2 diabetes mellitus with diabetic peripheral  angiopathy without gangrene ([place]) 05/01/2019  ? Wears dentures   ? lower  ? Wound infection s/p L transmetatarsal amputation   ? ?Review of Systems:   ?Difficulty performing adl's 2/2 R. BKA and L. AKA ? ? ? ?Assessment & Plan:  ? ?Requires assistance with activities of daily living (ADL) ?Assessment: ?Kathy Frank endorses difficulty performing bathing,dressing,eating/cooking,and getting to the commode. She has a history of right BKA and left AKA that limit her mobility. She uses a tub-transfer to help her get in and out of the shower. Overall with her chronic medical conditions of ESRD, HIV, and bilateral amputations I believe she would benefit from [facility]. Will fill out paperwork today. ? ?Plan: ?-fill out paperwork for PCS  ? ?Patient discussed with Dr. Jimmye Norman ? ?Kathy Frank ?Internal Medicine Resident ? ?

## 2021-10-27 NOTE — Assessment & Plan Note (Addendum)
Assessment: ?Kathy Frank endorses difficulty performing bathing,dressing,eating/cooking,and getting to the commode. She has a history of right BKA and left AKA that limit her mobility. She uses a tub-transfer to help her get in and out of the shower. Overall with her chronic medical conditions of ESRD, HIV, and bilateral amputations I believe she would benefit from [facility]. Will fill out paperwork today. ? ?Plan: ?-fill out paperwork for PCS ?

## 2021-10-28 DIAGNOSIS — I120 Hypertensive chronic kidney disease with stage 5 chronic kidney disease or end stage renal disease: Secondary | ICD-10-CM | POA: Diagnosis not present

## 2021-10-28 DIAGNOSIS — R197 Diarrhea, unspecified: Secondary | ICD-10-CM | POA: Diagnosis not present

## 2021-10-28 DIAGNOSIS — D689 Coagulation defect, unspecified: Secondary | ICD-10-CM | POA: Diagnosis not present

## 2021-10-28 DIAGNOSIS — R519 Headache, unspecified: Secondary | ICD-10-CM | POA: Diagnosis not present

## 2021-10-28 DIAGNOSIS — N186 End stage renal disease: Secondary | ICD-10-CM | POA: Diagnosis not present

## 2021-10-28 DIAGNOSIS — L299 Pruritus, unspecified: Secondary | ICD-10-CM | POA: Diagnosis not present

## 2021-10-28 DIAGNOSIS — N2581 Secondary hyperparathyroidism of renal origin: Secondary | ICD-10-CM | POA: Diagnosis not present

## 2021-10-28 DIAGNOSIS — Z992 Dependence on renal dialysis: Secondary | ICD-10-CM | POA: Diagnosis not present

## 2021-10-29 ENCOUNTER — Telehealth: Payer: Self-pay | Admitting: *Deleted

## 2021-10-29 NOTE — Telephone Encounter (Signed)
PCS FORM FAXED TO Florala Mena FOR REVIEW [phone] / [phone]). Patient has been contacted and is aware that office is to contact her with nurse in home visit appointment. ?

## 2021-10-30 DIAGNOSIS — L299 Pruritus, unspecified: Secondary | ICD-10-CM | POA: Diagnosis not present

## 2021-10-30 DIAGNOSIS — I120 Hypertensive chronic kidney disease with stage 5 chronic kidney disease or end stage renal disease: Secondary | ICD-10-CM | POA: Diagnosis not present

## 2021-10-30 DIAGNOSIS — N186 End stage renal disease: Secondary | ICD-10-CM | POA: Diagnosis not present

## 2021-10-30 DIAGNOSIS — R519 Headache, unspecified: Secondary | ICD-10-CM | POA: Diagnosis not present

## 2021-10-30 DIAGNOSIS — R197 Diarrhea, unspecified: Secondary | ICD-10-CM | POA: Diagnosis not present

## 2021-10-30 DIAGNOSIS — N2581 Secondary hyperparathyroidism of renal origin: Secondary | ICD-10-CM | POA: Diagnosis not present

## 2021-10-30 DIAGNOSIS — Z992 Dependence on renal dialysis: Secondary | ICD-10-CM | POA: Diagnosis not present

## 2021-10-30 DIAGNOSIS — D689 Coagulation defect, unspecified: Secondary | ICD-10-CM | POA: Diagnosis not present

## 2021-11-01 DIAGNOSIS — R197 Diarrhea, unspecified: Secondary | ICD-10-CM | POA: Diagnosis not present

## 2021-11-01 DIAGNOSIS — I120 Hypertensive chronic kidney disease with stage 5 chronic kidney disease or end stage renal disease: Secondary | ICD-10-CM | POA: Diagnosis not present

## 2021-11-01 DIAGNOSIS — D689 Coagulation defect, unspecified: Secondary | ICD-10-CM | POA: Diagnosis not present

## 2021-11-01 DIAGNOSIS — R519 Headache, unspecified: Secondary | ICD-10-CM | POA: Diagnosis not present

## 2021-11-01 DIAGNOSIS — L299 Pruritus, unspecified: Secondary | ICD-10-CM | POA: Diagnosis not present

## 2021-11-01 DIAGNOSIS — Z992 Dependence on renal dialysis: Secondary | ICD-10-CM | POA: Diagnosis not present

## 2021-11-01 DIAGNOSIS — N2581 Secondary hyperparathyroidism of renal origin: Secondary | ICD-10-CM | POA: Diagnosis not present

## 2021-11-01 DIAGNOSIS — N186 End stage renal disease: Secondary | ICD-10-CM | POA: Diagnosis not present

## 2021-11-03 NOTE — Telephone Encounter (Signed)
Encounter opened in error

## 2021-11-04 DIAGNOSIS — N2581 Secondary hyperparathyroidism of renal origin: Secondary | ICD-10-CM | POA: Diagnosis not present

## 2021-11-04 DIAGNOSIS — L299 Pruritus, unspecified: Secondary | ICD-10-CM | POA: Diagnosis not present

## 2021-11-04 DIAGNOSIS — Z992 Dependence on renal dialysis: Secondary | ICD-10-CM | POA: Diagnosis not present

## 2021-11-04 DIAGNOSIS — R519 Headache, unspecified: Secondary | ICD-10-CM | POA: Diagnosis not present

## 2021-11-04 DIAGNOSIS — D689 Coagulation defect, unspecified: Secondary | ICD-10-CM | POA: Diagnosis not present

## 2021-11-04 DIAGNOSIS — N186 End stage renal disease: Secondary | ICD-10-CM | POA: Diagnosis not present

## 2021-11-04 DIAGNOSIS — R197 Diarrhea, unspecified: Secondary | ICD-10-CM | POA: Diagnosis not present

## 2021-11-04 DIAGNOSIS — I120 Hypertensive chronic kidney disease with stage 5 chronic kidney disease or end stage renal disease: Secondary | ICD-10-CM | POA: Diagnosis not present

## 2021-11-06 DIAGNOSIS — R519 Headache, unspecified: Secondary | ICD-10-CM | POA: Diagnosis not present

## 2021-11-06 DIAGNOSIS — N186 End stage renal disease: Secondary | ICD-10-CM | POA: Diagnosis not present

## 2021-11-06 DIAGNOSIS — N2581 Secondary hyperparathyroidism of renal origin: Secondary | ICD-10-CM | POA: Diagnosis not present

## 2021-11-06 DIAGNOSIS — D689 Coagulation defect, unspecified: Secondary | ICD-10-CM | POA: Diagnosis not present

## 2021-11-06 DIAGNOSIS — Z992 Dependence on renal dialysis: Secondary | ICD-10-CM | POA: Diagnosis not present

## 2021-11-07 DIAGNOSIS — Z992 Dependence on renal dialysis: Secondary | ICD-10-CM | POA: Diagnosis not present

## 2021-11-07 DIAGNOSIS — E1129 Type 2 diabetes mellitus with other diabetic kidney complication: Secondary | ICD-10-CM | POA: Diagnosis not present

## 2021-11-07 DIAGNOSIS — N186 End stage renal disease: Secondary | ICD-10-CM | POA: Diagnosis not present

## 2021-11-09 ENCOUNTER — Encounter (HOSPITAL_COMMUNITY): Payer: Self-pay | Admitting: Emergency Medicine

## 2021-11-09 ENCOUNTER — Emergency Department (HOSPITAL_COMMUNITY): Payer: Medicare Other

## 2021-11-09 ENCOUNTER — Other Ambulatory Visit: Payer: Self-pay

## 2021-11-09 ENCOUNTER — Observation Stay (HOSPITAL_COMMUNITY)
Admission: EM | Admit: 2021-11-09 | Discharge: 2021-11-11 | Disposition: A | Discharge status: Home / Self Care | Payer: Medicare Other | Attending: Internal Medicine | Admitting: Internal Medicine

## 2021-11-09 DIAGNOSIS — R112 Nausea with vomiting, unspecified: Secondary | ICD-10-CM | POA: Diagnosis not present

## 2021-11-09 DIAGNOSIS — D631 Anemia in chronic kidney disease: Secondary | ICD-10-CM | POA: Insufficient documentation

## 2021-11-09 DIAGNOSIS — R000 Tachycardia, unspecified: Secondary | ICD-10-CM | POA: Insufficient documentation

## 2021-11-09 DIAGNOSIS — Z7984 Long term (current) use of oral hypoglycemic drugs: Secondary | ICD-10-CM | POA: Diagnosis not present

## 2021-11-09 DIAGNOSIS — I700 Atherosclerosis of aorta: Secondary | ICD-10-CM | POA: Diagnosis not present

## 2021-11-09 DIAGNOSIS — B20 Human immunodeficiency virus [HIV] disease: Secondary | ICD-10-CM | POA: Diagnosis not present

## 2021-11-09 DIAGNOSIS — I169 Hypertensive crisis, unspecified: Principal | ICD-10-CM

## 2021-11-09 DIAGNOSIS — R079 Chest pain, unspecified: Secondary | ICD-10-CM | POA: Diagnosis not present

## 2021-11-09 DIAGNOSIS — Z89612 Acquired absence of left leg above knee: Secondary | ICD-10-CM | POA: Diagnosis not present

## 2021-11-09 DIAGNOSIS — Z833 Family history of diabetes mellitus: Secondary | ICD-10-CM | POA: Insufficient documentation

## 2021-11-09 DIAGNOSIS — Z89511 Acquired absence of right leg below knee: Secondary | ICD-10-CM | POA: Diagnosis not present

## 2021-11-09 DIAGNOSIS — N2581 Secondary hyperparathyroidism of renal origin: Secondary | ICD-10-CM | POA: Insufficient documentation

## 2021-11-09 DIAGNOSIS — Z6828 Body mass index (BMI) 28.0-28.9, adult: Secondary | ICD-10-CM | POA: Insufficient documentation

## 2021-11-09 DIAGNOSIS — Z7902 Long term (current) use of antithrombotics/antiplatelets: Secondary | ICD-10-CM | POA: Diagnosis not present

## 2021-11-09 DIAGNOSIS — E1122 Type 2 diabetes mellitus with diabetic chronic kidney disease: Secondary | ICD-10-CM | POA: Insufficient documentation

## 2021-11-09 DIAGNOSIS — I252 Old myocardial infarction: Secondary | ICD-10-CM | POA: Insufficient documentation

## 2021-11-09 DIAGNOSIS — K3184 Gastroparesis: Secondary | ICD-10-CM | POA: Insufficient documentation

## 2021-11-09 DIAGNOSIS — I2510 Atherosclerotic heart disease of native coronary artery without angina pectoris: Secondary | ICD-10-CM | POA: Insufficient documentation

## 2021-11-09 DIAGNOSIS — Z955 Presence of coronary angioplasty implant and graft: Secondary | ICD-10-CM | POA: Diagnosis not present

## 2021-11-09 DIAGNOSIS — E669 Obesity, unspecified: Secondary | ICD-10-CM | POA: Insufficient documentation

## 2021-11-09 DIAGNOSIS — R0789 Other chest pain: Secondary | ICD-10-CM | POA: Diagnosis not present

## 2021-11-09 DIAGNOSIS — Z7982 Long term (current) use of aspirin: Secondary | ICD-10-CM | POA: Insufficient documentation

## 2021-11-09 DIAGNOSIS — Z992 Dependence on renal dialysis: Secondary | ICD-10-CM | POA: Insufficient documentation

## 2021-11-09 DIAGNOSIS — F32A Depression, unspecified: Secondary | ICD-10-CM | POA: Insufficient documentation

## 2021-11-09 DIAGNOSIS — R0602 Shortness of breath: Secondary | ICD-10-CM | POA: Diagnosis not present

## 2021-11-09 DIAGNOSIS — E1143 Type 2 diabetes mellitus with diabetic autonomic (poly)neuropathy: Secondary | ICD-10-CM | POA: Insufficient documentation

## 2021-11-09 DIAGNOSIS — Z794 Long term (current) use of insulin: Secondary | ICD-10-CM | POA: Diagnosis not present

## 2021-11-09 DIAGNOSIS — R519 Headache, unspecified: Secondary | ICD-10-CM | POA: Diagnosis not present

## 2021-11-09 DIAGNOSIS — E1169 Type 2 diabetes mellitus with other specified complication: Secondary | ICD-10-CM | POA: Diagnosis present

## 2021-11-09 DIAGNOSIS — Z5982 Transportation insecurity: Secondary | ICD-10-CM | POA: Insufficient documentation

## 2021-11-09 DIAGNOSIS — Z79899 Other long term (current) drug therapy: Secondary | ICD-10-CM | POA: Diagnosis not present

## 2021-11-09 DIAGNOSIS — N186 End stage renal disease: Secondary | ICD-10-CM | POA: Insufficient documentation

## 2021-11-09 DIAGNOSIS — Z8673 Personal history of transient ischemic attack (TIA), and cerebral infarction without residual deficits: Secondary | ICD-10-CM | POA: Diagnosis not present

## 2021-11-09 DIAGNOSIS — G43909 Migraine, unspecified, not intractable, without status migrainosus: Secondary | ICD-10-CM | POA: Insufficient documentation

## 2021-11-09 DIAGNOSIS — R7989 Other specified abnormal findings of blood chemistry: Secondary | ICD-10-CM | POA: Insufficient documentation

## 2021-11-09 DIAGNOSIS — R778 Other specified abnormalities of plasma proteins: Secondary | ICD-10-CM | POA: Insufficient documentation

## 2021-11-09 DIAGNOSIS — I161 Hypertensive emergency: Secondary | ICD-10-CM | POA: Diagnosis present

## 2021-11-09 DIAGNOSIS — Z7985 Long-term (current) use of injectable non-insulin antidiabetic drugs: Secondary | ICD-10-CM | POA: Insufficient documentation

## 2021-11-09 DIAGNOSIS — R10817 Generalized abdominal tenderness: Secondary | ICD-10-CM | POA: Insufficient documentation

## 2021-11-09 DIAGNOSIS — Z87891 Personal history of nicotine dependence: Secondary | ICD-10-CM | POA: Insufficient documentation

## 2021-11-09 DIAGNOSIS — Z91158 Patient's noncompliance with renal dialysis for other reason: Secondary | ICD-10-CM | POA: Insufficient documentation

## 2021-11-09 DIAGNOSIS — I120 Hypertensive chronic kidney disease with stage 5 chronic kidney disease or end stage renal disease: Secondary | ICD-10-CM | POA: Diagnosis not present

## 2021-11-09 DIAGNOSIS — E872 Acidosis, unspecified: Secondary | ICD-10-CM | POA: Insufficient documentation

## 2021-11-09 DIAGNOSIS — E119 Type 2 diabetes mellitus without complications: Secondary | ICD-10-CM | POA: Diagnosis present

## 2021-11-09 DIAGNOSIS — E11319 Type 2 diabetes mellitus with unspecified diabetic retinopathy without macular edema: Secondary | ICD-10-CM | POA: Insufficient documentation

## 2021-11-09 DIAGNOSIS — E1151 Type 2 diabetes mellitus with diabetic peripheral angiopathy without gangrene: Secondary | ICD-10-CM | POA: Diagnosis not present

## 2021-11-09 DIAGNOSIS — R1114 Bilious vomiting: Secondary | ICD-10-CM | POA: Insufficient documentation

## 2021-11-09 DIAGNOSIS — R002 Palpitations: Secondary | ICD-10-CM | POA: Insufficient documentation

## 2021-11-09 DIAGNOSIS — K219 Gastro-esophageal reflux disease without esophagitis: Secondary | ICD-10-CM | POA: Insufficient documentation

## 2021-11-09 DIAGNOSIS — R9431 Abnormal electrocardiogram [ECG] [EKG]: Secondary | ICD-10-CM | POA: Insufficient documentation

## 2021-11-09 HISTORY — DX: Hypertensive emergency: I16.1

## 2021-11-09 LAB — CBC WITH DIFFERENTIAL/PLATELET
Abs Immature Granulocytes: 0.06 10*3/uL (ref 0.00–0.07)
Basophils Absolute: 0.1 10*3/uL (ref 0.0–0.1)
Basophils Relative: 1 %
Eosinophils Absolute: 0.2 10*3/uL (ref 0.0–0.5)
Eosinophils Relative: 2 %
HCT: 33.6 % — ABNORMAL LOW (ref 36.0–46.0)
Hemoglobin: 10.7 g/dL — ABNORMAL LOW (ref 12.0–15.0)
Immature Granulocytes: 1 %
Lymphocytes Relative: 22 %
Lymphs Abs: 1.4 10*3/uL (ref 0.7–4.0)
MCH: 29 pg (ref 26.0–34.0)
MCHC: 31.8 g/dL (ref 30.0–36.0)
MCV: 91.1 fL (ref 80.0–100.0)
Monocytes Absolute: 0.5 10*3/uL (ref 0.1–1.0)
Monocytes Relative: 8 %
Neutro Abs: 4.1 10*3/uL (ref 1.7–7.7)
Neutrophils Relative %: 66 %
Platelets: 261 10*3/uL (ref 150–400)
RBC: 3.69 MIL/uL — ABNORMAL LOW (ref 3.87–5.11)
RDW: 16.7 % — ABNORMAL HIGH (ref 11.5–15.5)
WBC: 6.3 10*3/uL (ref 4.0–10.5)
nRBC: 0 % (ref 0.0–0.2)

## 2021-11-09 LAB — GLUCOSE, CAPILLARY: Glucose-Capillary: 168 mg/dL — ABNORMAL HIGH (ref 70–99)

## 2021-11-09 LAB — COMPREHENSIVE METABOLIC PANEL
ALT: 7 U/L (ref 0–44)
AST: 15 U/L (ref 15–41)
Albumin: 3.5 g/dL (ref 3.5–5.0)
Alkaline Phosphatase: 66 U/L (ref 38–126)
Anion gap: 21 — ABNORMAL HIGH (ref 5–15)
BUN: 73 mg/dL — ABNORMAL HIGH (ref 6–20)
CO2: 19 mmol/L — ABNORMAL LOW (ref 22–32)
Calcium: 7 mg/dL — ABNORMAL LOW (ref 8.9–10.3)
Chloride: 99 mmol/L (ref 98–111)
Creatinine, Ser: 10.29 mg/dL — ABNORMAL HIGH (ref 0.44–1.00)
GFR, Estimated: 4 mL/min — ABNORMAL LOW (ref >60)
Glucose, Bld: 141 mg/dL — ABNORMAL HIGH (ref 70–99)
Potassium: 5 mmol/L (ref 3.5–5.1)
Sodium: 139 mmol/L (ref 135–145)
Total Bilirubin: 0.5 mg/dL (ref 0.3–1.2)
Total Protein: <3 g/dL — ABNORMAL LOW (ref 6.5–8.1)

## 2021-11-09 LAB — I-STAT BETA HCG BLOOD, ED (MC, WL, AP ONLY): I-stat hCG, quantitative: <5 m[IU]/mL (ref <5)

## 2021-11-09 LAB — TROPONIN I (HIGH SENSITIVITY)
Troponin I (High Sensitivity): 55 ng/L — ABNORMAL HIGH (ref <18)
Troponin I (High Sensitivity): 73 ng/L — ABNORMAL HIGH (ref <18)
Troponin I (High Sensitivity): 70 ng/L — ABNORMAL HIGH (ref <18)

## 2021-11-09 LAB — BRAIN NATRIURETIC PEPTIDE: B Natriuretic Peptide: 3684.7 pg/mL — ABNORMAL HIGH (ref 0.0–100.0)

## 2021-11-09 MED ORDER — METOCLOPRAMIDE HCL 5 MG/ML IJ SOLN
5.0000 mg | Freq: Once | INTRAMUSCULAR | Status: AC
  Administered 2021-11-09: 5 mg via INTRAVENOUS
  Filled 2021-11-09: qty 2

## 2021-11-09 MED ORDER — PROPRANOLOL HCL 10 MG PO TABS
10.0000 mg | ORAL_TABLET | Freq: Three times a day (TID) | ORAL | Status: DC
  Administered 2021-11-10 – 2021-11-11 (×4): 10 mg via ORAL
  Filled 2021-11-09 (×9): qty 1

## 2021-11-09 MED ORDER — CLOPIDOGREL BISULFATE 75 MG PO TABS
75.0000 mg | ORAL_TABLET | Freq: Every day | ORAL | Status: DC
  Administered 2021-11-09 – 2021-11-11 (×3): 75 mg via ORAL
  Filled 2021-11-09 (×3): qty 1

## 2021-11-09 MED ORDER — HEPARIN SODIUM (PORCINE) 5000 UNIT/ML IJ SOLN
5000.0000 [IU] | Freq: Three times a day (TID) | INTRAMUSCULAR | Status: DC
  Administered 2021-11-09 – 2021-11-11 (×5): 5000 [IU] via SUBCUTANEOUS
  Filled 2021-11-09 (×5): qty 1

## 2021-11-09 MED ORDER — ACETAMINOPHEN 650 MG RE SUPP: 650.0000 mg | Freq: Four times a day (QID) | RECTAL | Status: DC | PRN

## 2021-11-09 MED ORDER — HYDRALAZINE HCL 50 MG PO TABS
75.0000 mg | ORAL_TABLET | Freq: Three times a day (TID) | ORAL | Status: DC
  Administered 2021-11-09 (×2): 75 mg via ORAL
  Filled 2021-11-09: qty 3
  Filled 2021-11-09: qty 1

## 2021-11-09 MED ORDER — PROCHLORPERAZINE EDISYLATE 10 MG/2ML IJ SOLN
10.0000 mg | Freq: Once | INTRAMUSCULAR | Status: AC
  Administered 2021-11-09: 10 mg via INTRAVENOUS
  Filled 2021-11-09: qty 2

## 2021-11-09 MED ORDER — DIPHENHYDRAMINE HCL 50 MG/ML IJ SOLN
12.5000 mg | Freq: Once | INTRAMUSCULAR | Status: AC
  Administered 2021-11-09: 12.5 mg via INTRAVENOUS
  Filled 2021-11-09: qty 1

## 2021-11-09 MED ORDER — BICTEGRAVIR-EMTRICITAB-TENOFOV 50-200-25 MG PO TABS
1.0000 | ORAL_TABLET | Freq: Every day | ORAL | Status: DC
  Administered 2021-11-09 – 2021-11-11 (×3): 1 via ORAL
  Filled 2021-11-09 (×3): qty 1

## 2021-11-09 MED ORDER — NITROGLYCERIN IN D5W 200-5 MCG/ML-% IV SOLN
0.0000 ug/min | INTRAVENOUS | Status: DC
  Administered 2021-11-09: 50 ug/min via INTRAVENOUS
  Administered 2021-11-10: 150 ug/min via INTRAVENOUS
  Filled 2021-11-09 (×4): qty 250

## 2021-11-09 MED ORDER — PANTOPRAZOLE SODIUM 40 MG PO TBEC
40.0000 mg | DELAYED_RELEASE_TABLET | Freq: Every day | ORAL | Status: DC
  Administered 2021-11-10 – 2021-11-11 (×2): 40 mg via ORAL
  Filled 2021-11-09 (×2): qty 1

## 2021-11-09 MED ORDER — FENTANYL CITRATE PF 50 MCG/ML IJ SOSY
50.0000 ug | PREFILLED_SYRINGE | Freq: Once | INTRAMUSCULAR | Status: AC
  Administered 2021-11-09: 50 ug via INTRAVENOUS
  Filled 2021-11-09: qty 1

## 2021-11-09 MED ORDER — INSULIN ASPART 100 UNIT/ML IJ SOLN
0.0000 [IU] | Freq: Three times a day (TID) | INTRAMUSCULAR | Status: DC
  Administered 2021-11-10: 1 [IU] via SUBCUTANEOUS
  Administered 2021-11-10: 2 [IU] via SUBCUTANEOUS
  Administered 2021-11-11: 1 [IU] via SUBCUTANEOUS

## 2021-11-09 MED ORDER — ACETAMINOPHEN 325 MG PO TABS
650.0000 mg | ORAL_TABLET | Freq: Four times a day (QID) | ORAL | Status: DC | PRN
  Administered 2021-11-09: 650 mg via ORAL
  Filled 2021-11-09: qty 2

## 2021-11-09 MED ORDER — AMLODIPINE BESYLATE 10 MG PO TABS
10.0000 mg | ORAL_TABLET | Freq: Every day | ORAL | Status: DC
  Administered 2021-11-09 – 2021-11-11 (×3): 10 mg via ORAL
  Filled 2021-11-09: qty 2
  Filled 2021-11-09 (×2): qty 1

## 2021-11-09 MED ORDER — ASPIRIN 81 MG PO CHEW
324.0000 mg | CHEWABLE_TABLET | Freq: Once | ORAL | Status: AC
Start: 2021-11-09 — End: 2021-11-09
  Administered 2021-11-09: 324 mg via ORAL
  Filled 2021-11-09: qty 4

## 2021-11-09 MED ORDER — CHLORHEXIDINE GLUCONATE CLOTH 2 % EX PADS
6.0000 | MEDICATED_PAD | Freq: Every day | CUTANEOUS | Status: DC
  Administered 2021-11-10 – 2021-11-11 (×2): 6 via TOPICAL

## 2021-11-09 NOTE — ED Notes (Signed)
MD at bedside. 

## 2021-11-09 NOTE — Progress Notes (Incomplete)
? ? ?HD#0 ?SUBJECTIVE:  ?Patient Summary: Kathy Frank is a 46 y.o. with a pertinent PMH of ***, who presented with *** and admitted for ***.  ? ?Overnight Events: *** ? ?  ?Interm History: *** ? ?OBJECTIVE:  ?Vital Signs: ?Vitals:  ? 11/09/21 2130 11/09/21 2145 11/09/21 2300 11/09/21 2302  ?BP:  (!) 165/88  (!) 166/92  ?Pulse: 92     ?Resp: 17 19    ?Temp:   98.2 ?F (36.8 ?C)   ?TempSrc:   Oral   ?SpO2: 100%     ?Weight:   76.5 kg   ? ?Supplemental O2: {NAMES:[id number]::"Room Air","Nasal Cannula","Simple Face Mask","Partial Rebreather","HFNC","Non Rebreather","Venturi Mask","Bag Valve Mask"} ?SpO2: 100 % ? ?Filed Weights  ? 11/09/21 2300  ?Weight: 76.5 kg  ? ? ?No intake or output data in the 24 hours ending 11/09/21 2356 ?Net IO Since Admission: No IO data has been entered for this period [11/09/21 2356] ? ?Physical Exam: ?Physical Exam ? ?Patient Lines/Drains/Airways Status   ? ? Active Line/Drains/Airways   ? ? Name Placement date Placement time Site Days  ? Fistula / Graft Left Forearm Arteriovenous fistula 04/14/19  1642  Forearm  940  ? Fistula / Graft Left Upper arm --  --  Upper arm  --  ? Negative Pressure Wound Therapy 04/18/21  1444  --  205  ? Negative Pressure Wound Therapy Leg Anterior;Right 05/20/21  0000  --  173  ? Incision (Closed) 04/14/19 Arm Left 04/14/19  1304  -- 940  ? Incision (Closed) 01/31/21 Leg Right 01/31/21  1143  -- 282  ? Incision (Closed) 03/28/21 Thigh Right 03/28/21  0835  -- 226  ? Incision (Closed) 04/18/21 Leg Right 04/18/21  1440  -- 205  ? ?  ?  ? ?  ? ? ?Pertinent Labs: ? ?  Latest Ref Rng & Units 11/09/2021  ?  2:24 PM 07/10/2021  ?  7:08 AM 07/09/2021  ? 11:54 PM  ?CBC  ?WBC 4.0 - 10.5 K/uL 6.3   5.5   5.9    ?Hemoglobin 12.0 - 15.0 g/dL 10.7   7.9   9.0    ?Hematocrit 36.0 - 46.0 % 33.6   24.6   28.2    ?Platelets 150 - 400 K/uL 261   184   188    ? ? ? ?  Latest Ref Rng & Units 11/09/2021  ?  2:24 PM 07/10/2021  ?  1:00 PM 07/10/2021  ?  7:15 AM  ?CMP  ?Glucose 70 - 99 mg/dL  141    111    ?BUN 6 - 20 mg/dL 73    99    ?Creatinine 0.44 - 1.00 mg/dL 10.29    12.12    ?Sodium 135 - 145 mmol/L 139    142    ?Potassium 3.5 - 5.1 mmol/L 5.0   3.3   5.3    ?Chloride 98 - 111 mmol/L 99    103    ?CO2 22 - 32 mmol/L 19    15    ?Calcium 8.9 - 10.3 mg/dL 7.0    6.5    ?Total Protein 6.5 - 8.1 g/dL <3.0      ?Total Bilirubin 0.3 - 1.2 mg/dL 0.5      ?Alkaline Phos 38 - 126 U/L 66      ?AST 15 - 41 U/L 15      ?ALT 0 - 44 U/L 7      ? ? ?Recent Labs  ?  11/09/21 ?2259  ?GLUCAP 168*  ?  ? ?Pertinent Imaging: ?DG Chest 2 View ? ?Result Date: 11/09/2021 ?CLINICAL DATA:  Chest pain EXAM: CHEST - 2 VIEW COMPARISON:  07/10/2021 FINDINGS: Aortic atherosclerotic calcifications. The heart size and mediastinal contours are within normal limits. Both lungs are clear. The visualized skeletal structures are unremarkable. IMPRESSION: No active cardiopulmonary abnormalities. Electronically Signed   By: Kerby Moors M.D.   On: 11/09/2021 13:34  ? ?CT Head Wo Contrast ? ?Result Date: 11/09/2021 ?CLINICAL DATA:  Headache, chronic, new features or increased frequency EXAM: CT HEAD WITHOUT CONTRAST TECHNIQUE: Contiguous axial images were obtained from the base of the skull through the vertex without intravenous contrast. RADIATION DOSE REDUCTION: This exam was performed according to the departmental dose-optimization program which includes automated exposure control, adjustment of the mA and/or kV according to patient size and/or use of iterative reconstruction technique. COMPARISON:  CT head dated July 10, 2021 FINDINGS: Brain: No evidence of acute infarction, hemorrhage, hydrocephalus, extra-axial collection or mass lesion/mass effect. Hypodensity in the right periventricular white matter, unchanged. Cavum septum pellucidum and cavum vergae. Vascular: No hyperdense vessel or unexpected calcification. Skull: Normal. Negative for fracture or focal lesion. Sinuses/Orbits: No acute finding. Other: None. IMPRESSION: 1.  No acute intracranial abnormality. 2. Chronic right periventricular infarct, unchanged. Electronically Signed   By: Keane Police D.O.   On: 11/09/2021 16:10   ? ?ASSESSMENT/PLAN:  ?Assessment: ?Principal Problem: ?  Hypertensive emergency ?Active Problems: ?  Diabetes mellitus type 2 in obese [facility]) ?  ESRD (end stage renal disease) ([place]) ? ? ?#Severe Symptomatic Hypertension*** ?Patient presented complaining of nausea, vomiting, headache, chest tightness and shortness of breath. On presentation in the ED, noted to have elevated blood pressure, SBP greater than 200. Symptom onset following missed dialysis session due to transportation issue. Patient reported compliance with daily medications, however may have vomited the medication.  Home meds include amlodipine 10 mg, hydralazine 25 mg 3 times daily and propranolol 20 mg Q8H. CT head was obtained which ruled out structural and intracranial abnormalities that could cause HA, and N/V. Symptoms likely due to to severely elevated blood pressure.  Patient endorsed chest tightness and SOB; EKG reassuring, negative for any ischemic changes. Troponins flat. On exam, reproducible chest discomfort noted upon palpation of the lower sternum. Low suspicion for cardiac event. However, given significant cardiovascular history, patient was started on nitro drip in the ED. Furthermore, patient has had no further episodes of emesis. Patient endorsed resolution of chest discomfort upon administration of nitroglycerin drip.  Patient also received home dose of amlodipine and hydralazine with improvement in her blood pressure.  ?--Amlodipine 10 mg daily ?--Hydralazine 25 mg daily ?--Restart propranolol 20 mg every 8 hours ?--BNP pending ?--TSH pending ?--Cardiac monitoring ?--Avoid QT prolonging agents if possible ?--Resume home med Protonix 40mg  ?  ?#Palpitation ? Last echo October 2022 revealed normal EF 60 to 65%; normal function of the left ventricle, no wall motion abnormalities.   No valvulopathy noted.  Patient was last seen by cardiologist in January 2023, at that time patient was set to wear long-term monitoring for evaluation of palpitations.  However, patient did not complete.  Patient was tachycardic, however denied palpitations at this time.  Tachycardia cause unclear, though could be due to missed dose of propanolol resulting in rebound.   ?--We will need to restart home medication propranolol. ?  ?#ESRD on HD ?Patient receives HD Tuesdays,Thursdays and Saturdays.  However, patient missed Saturday session due to transportation issues.  CMP significant for elevated creatinine at 10.2.  No electrolyte imbalance noted except calcium of 7. No symptoms associated.  No significant EKG changes noted.  Nephrology consulted and would like for patient to receive HD as soon as possible.  Due to nitroglycerin drip, HD can be done at bedside. ?--Neurology following, recs appreciated ?  ?#HIV ?January 2023: Viral load undetectable; CD4 count 623. Follows with [facility], Dr. Megan Salon. ?--Resume Biktarvy ?  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  Home medication includes Victoza and Lantus 15 units at bedtime.  Patient states she did not take her Lantus overnight due to vomiting and lack of appetite. ?--SSI very sensitive scale ?--Resume renal diet as tolerated ?--Resume Lantus at bedtime tomorrow night ?--A1c pending ?  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 ?-- Resume home meds tomorrow ?  ?#Normocytic anemia of chronic disease ?On admission, hemoglobin 10.7 hematocrit 33.6.  Appears to be at baseline per chart review.  This is a chronic condition and no active signs of bleeding.  ?-- Continue to monitor ? ? ?Signature: ?Delene Ruffini, MD  ?Internal Medicine Resident, PGY-1 ?[facility]   ?Pager: [phone] ?11:56 PM, 11/09/2021  ? ?Please contact the on call pager after 5 pm and on weekends at [phone]. ? ?

## 2021-11-09 NOTE — ED Provider Notes (Signed)
?[facility] ?Provider Note ? ? ?CSN: [id number] ?Arrival date & time: 11/09/21  1244 ? ?  ? ?History ? ?Chief Complaint  ?Patient presents with  ? Chest Pain  ? Abdominal Pain  ? ? ?Kathy Frank is a 46 y.o. female. ? ? ?Chest Pain ?Associated symptoms: abdominal pain, headache, nausea, shortness of breath and vomiting   ?Abdominal Pain ?Associated symptoms: chest pain, nausea, shortness of breath and vomiting   ?46 year old female presenting for left lateral chest wall pain, vomiting, shortness of breath.  Medical history includes T2DM, depression, HTN, HIV, ESRD (HD on T, TH, SA; missed yesterday session due to feeling unwell), gastroparesis, anemia, left AKA, PAD, right BKA, CAD.  She is followed by [facility] (Dr. Godfrey Pick).  Last office visit was January.  Last heart cath was in October.  She was found to have multivessel disease.  She did undergo an LAD stent at that time.  Due to her nausea and vomiting, patient has been unable to take her home medications.  The last medication she took was 2 days ago.  Currently she endorses substernal pain and left lateral chest wall pain.  She does feel that she is mildly short of breath.  She also endorses a headache that is consistent with her prior migraine headaches. ?  ? ?Home Medications ?Prior to Admission medications   ?Medication Sig Start Date End Date Taking? Authorizing Provider  ?albuterol (VENTOLIN HFA) 108 (90 Base) MCG/ACT inhaler Inhale 2 puffs into the lungs every 4 (four) hours as needed for wheezing or shortness of breath. 02/14/21  Yes Angiulli, Lavon Paganini, PA-C  ?amLODipine (NORVASC) 10 MG tablet Take 1 tablet (10 mg total) by mouth daily. 05/15/21  Yes [name], MD  ?aspirin EC 81 MG tablet Take 1 tablet (81 mg total) by mouth daily. Swallow whole. 12/17/20  Yes Angelia Mould, MD  ?atorvastatin (LIPITOR) 80 MG tablet Take 1 tablet (80 mg total) by mouth daily. 05/24/21  Yes Masters, Scientist, research (physical sciences), DO   ?bictegravir-emtricitabine-tenofovir AF (BIKTARVY) 50-200-25 MG TABS tablet TAKE 1 TABLET BY MOUTH DAILY. 08/20/21 11/23/21 Yes Michel Bickers, MD  ?calcitRIOL (ROCALTROL) 0.25 MCG capsule Take 1 capsule (0.25 mcg total) by mouth Every Tuesday,Thursday,and Saturday with dialysis. 04/01/21  Yes Ulyses Amor, PA-C  ?calcium acetate (PHOSLO) 667 MG capsule Take 3 capsules (2,001 mg total) by mouth 3 (three) times daily with meals. 02/14/21  Yes Angiulli, Lavon Paganini, PA-C  ?clopidogrel (PLAVIX) 75 MG tablet Take 1 tablet (75 mg total) by mouth daily. 02/14/21  Yes Angiulli, Lavon Paganini, PA-C  ?cyclobenzaprine (FLEXERIL) 5 MG tablet Take 1 tablet (5 mg total) by mouth 2 (two) times daily as needed for muscle spasms. 05/23/21  Yes Masters, Katie, DO  ?hydrALAZINE (APRESOLINE) 25 MG tablet Take 3 tablets (75 mg total) by mouth 3 (three) times daily. ?Patient taking differently: Take 50-75 mg by mouth See admin instructions. Takes 75 mg 3 times on Monday Wednesday Friday and 50 mg 3 times on all other days. 05/23/21  Yes Masters, Katie, DO  ?insulin glargine (LANTUS SOLOSTAR) 100 UNIT/ML Solostar Pen Inject 8 Units into the skin at bedtime. ?Patient taking differently: Inject 15 Units into the skin at bedtime. 02/14/21  Yes Angiulli, Lavon Paganini, PA-C  ?liraglutide (VICTOZA) 18 MG/3ML SOPN Inject 0.6 mg into the skin daily. ?Patient taking differently: Inject 1.6 mg into the skin daily. 05/02/21 11/09/21 Yes Atway, Rayann N, DO  ?loperamide (IMODIUM A-D) 2 MG tablet Take 2 mg by  mouth 4 (four) times daily as needed for diarrhea or loose stools.   Yes [provider]  ?multivitamin (RENA-VIT) TABS tablet Take 1 tablet by mouth daily. ?Patient taking differently: Take 1 tablet by mouth See admin instructions. On Tuesday, Wednesday and Thursday 02/14/21  Yes Angiulli, Lavon Paganini, PA-C  ?polyethylene glycol (MIRALAX / GLYCOLAX) 17 g packet Take 17 g by mouth 2 (two) times daily. ?Patient taking differently: Take 17 g by mouth daily as  needed for mild constipation or moderate constipation. 02/14/21  Yes Angiulli, Lavon Paganini, PA-C  ?propranolol (INDERAL) 20 MG tablet Take 1 tablet (20 mg total) by mouth every 8 (eight) hours. 09/03/21  Yes Tobb, Kardie, DO  ?Accu-Chek Softclix Lancets lancets Use as instructed 05/02/21   Atway, Rayann N, DO  ?gabapentin (NEURONTIN) 100 MG capsule Take 1-3 capsules (100-300 mg total) by mouth See admin instructions. Take 100 mg on Monday, Wednesday, Friday and Sunday ?Patient not taking: Reported on 11/09/2021 05/23/21 08/21/21  Masters, Joellen Jersey, DO  ?glucose blood test strip Use as instructed 05/02/21   Dorethea Clan, DO  ?Insulin Pen Needle (PENTIPS) 32G X 4 MM MISC Use as directed 02/14/21   Meredith Staggers, MD  ?pantoprazole (PROTONIX) 40 MG tablet Take 1 tablet (40 mg total) by mouth daily. ?Patient not taking: Reported on 09/05/2021 02/14/21   Cathlyn Parsons, PA-C  ?sertraline (ZOLOFT) 50 MG tablet Take 1 tablet (50 mg total) by mouth at bedtime. ?Patient not taking: Reported on 09/05/2021 02/14/21   Cathlyn Parsons, PA-C  ?   ? ?Allergies    ?Patient has no known allergies.   ? ?Review of Systems   ?Review of Systems  ?Respiratory:  Positive for shortness of breath.   ?Cardiovascular:  Positive for chest pain.  ?Gastrointestinal:  Positive for abdominal pain, nausea and vomiting.  ?Neurological:  Positive for headaches.  ?All other systems reviewed and are negative. ? ?Physical Exam ?Updated Vital Signs ?BP (!) 166/92   Pulse 92   Temp 98.2 ?F (36.8 ?C) (Oral)   Resp 19   Wt 76.5 kg   SpO2 100%   BMI 28.07 kg/m?  ?Physical Exam ?Vitals and nursing note reviewed.  ?Constitutional:   ?   General: She is not in acute distress. ?   Appearance: She is well-developed. She is ill-appearing. She is not toxic-appearing or diaphoretic.  ?HENT:  ?   Head: Normocephalic and atraumatic.  ?Eyes:  ?   Conjunctiva/sclera: Conjunctivae normal.  ?Cardiovascular:  ?   Rate and Rhythm: Normal rate and regular rhythm.  ?   Heart  sounds: No murmur heard. ?Pulmonary:  ?   Effort: Pulmonary effort is normal. No respiratory distress.  ?   Breath sounds: Normal breath sounds. No decreased breath sounds, wheezing, rhonchi or rales.  ?Chest:  ?   Chest wall: No tenderness.  ?Abdominal:  ?   Palpations: Abdomen is soft.  ?   Tenderness: There is no abdominal tenderness.  ?Musculoskeletal:     ?   General: No swelling.  ?   Cervical back: Normal range of motion and neck supple.  ?   Right lower leg: No edema.  ?   Left lower leg: No edema.  ?   Comments: Left AKA and right BKA  ?Skin: ?   General: Skin is warm and dry.  ?Neurological:  ?   General: No focal deficit present.  ?   Mental Status: She is alert and oriented to person, place, and time.  ?  Cranial Nerves: No cranial nerve deficit.  ?   Motor: No weakness.  ?Psychiatric:     ?   Mood and Affect: Mood normal.     ?   Behavior: Behavior normal.  ? ? ?ED Results / Procedures / Treatments   ?Labs ?(all labs ordered are listed, but only abnormal results are displayed) ?Labs Reviewed  ?COMPREHENSIVE METABOLIC PANEL - Abnormal; Notable for the following components:  ?    Result Value  ? CO2 19 (*)   ? Glucose, Bld 141 (*)   ? BUN 73 (*)   ? Creatinine, Ser 10.29 (*)   ? Calcium 7.0 (*)   ? Total Protein <3.0 (*)   ? GFR, Estimated 4 (*)   ? Anion gap 21 (*)   ? All other components within normal limits  ?CBC WITH DIFFERENTIAL/PLATELET - Abnormal; Notable for the following components:  ? RBC 3.69 (*)   ? Hemoglobin 10.7 (*)   ? HCT 33.6 (*)   ? RDW 16.7 (*)   ? All other components within normal limits  ?BRAIN NATRIURETIC PEPTIDE - Abnormal; Notable for the following components:  ? B Natriuretic Peptide 3,684.7 (*)   ? All other components within normal limits  ?GLUCOSE, CAPILLARY - Abnormal; Notable for the following components:  ? Glucose-Capillary 168 (*)   ? All other components within normal limits  ?TROPONIN I (HIGH SENSITIVITY) - Abnormal; Notable for the following components:  ? Troponin  I (High Sensitivity) 73 (*)   ? All other components within normal limits  ?TROPONIN I (HIGH SENSITIVITY) - Abnormal; Notable for the following components:  ? Troponin I (High Sensitivity) 55 (*)   ? All other components within n

## 2021-11-09 NOTE — ED Notes (Signed)
Called report to [facility] RN ?

## 2021-11-09 NOTE — Hospital Course (Signed)
Didn't feel well.  ? ?Normal day.  ? ?Vomiited five times yesterday. Didn't eat much. Looked like "bile" no blood.  ?No diarrhea. Had normal stool.  ?Had side pain, like a gas pain. Started feeling chest tightness, thsi was mild.  ? ?Got a migraine.  ? ?Last time had migraine with chest tightness  ? ?Throwing up overnight. No stomach pain until vomiting.  ? ?Never had pain in side.  ? ?No fever or sick contacts.  ? ?Constant L sided pain. Aching, started after vomiting. No trauma to the area.  ? ?Vomited a couple of times since being here. CP resolved since being here.  ? ?PMH: ?ESRD on HD, oliguric  ?HTN ?CAD ?HIV on biktarvy  ?T2DM ? ?PSH:  ?Last year R sided BKA ?L AKA 2020 ? ? ? ? ?SH: ?Lives with two daugthers and grandson ?On disability  ?No smoking ?Smoked stopped in 2016 1ppd for about 10-15 years ?No current alcohol use.  ?Marijuana every day  ?No cocaine or other drug use  ? ? ? ?FH:  ? ?Full code ? ? ?

## 2021-11-09 NOTE — H&P (Addendum)
? ? ? ?Date: 11/09/2021     ?     ?     ?Patient Name:  Kathy Frank MRN: [id number]  ?DOB: 04-28-1976 Age / Sex: 46 y.o., female   ?PCP: [name], MD    ?     ?Medical Service: Internal Medicine Teaching Service    ?     ?Attending Physician: Dr. Charise Killian, MD    ?First Contact: Dr. Lisabeth Devoid Pager: [phone]  ?Second Contact: Dr. Vinetta Bergamo Pager: [phone]  ?     ?After Hours (After 5p/  First Contact Pager: [phone]  ?weekends / holidays): Second Contact Pager: [phone]  ? ?Chief Complaint: Nausea, vomiting and chest tightness ? ?History of Present Illness: Kathy Frank is a 46 year old female with a past medical history of CAD s/p stent, CVA, PAD, HTN, ESRD on dialysis, HIV, insulin-dependent type 2 diabetes presenting with complaint of nausea, vomiting and chest tightness.  She states that yesterday she did not feel well, endorsing anorexia and fatigue.  As the day progressed into the evening, she began vomiting.  She endorsed about 5 episodes of nonbloody bilious emesis.  She did not get much sleep through the night.  Stating that she developed left-sided abdominal pain that was constant and aching in nature.  Denies any recent falls or trauma to the area.  She also reports chest tightness, shortness of breath and headache.  She did not take any over-the-counter supplements or any remedies to relieve her symptoms.  She denies any sick contacts.  She endorsed compliance with all of her medications.  However she does feel that she may have vomited her medications for the day yesterday.  She was scheduled for dialysis and states she missed due to transportation issues, she missed the bus.  Due to developing chest tightness and left-sided abdominal pain, that prompted ED visit.  She was concerned about a heart attack, because in the past she has had similar symptoms resulting in heart attack. ? ? ?Meds:  ?Albuterol as needed ?Amlodipine 10 mg daily ?Aspirin 81 mg daily ?Atorvastatin 80 mg daily  ?Biktarvy  daily ?Calcitriol 0.25 mg Tuesday/Thursday/Saturday with dialysis ?PhosLo 3 times daily ?Clopidogrel 75 mg daily ?Cyclobenzaprine 5 mg twice daily ?Hydralazine 25 mg 3 times daily ?Lantus 15 units nightly ?Pantoprazole 40 mg daily ?Propranolol 20 mg Q8H ?Sertraline 50 mg at bedtime ?Victoza 1.6 mg ? ? ?Current Meds  ?Medication Sig  ? albuterol (VENTOLIN HFA) 108 (90 Base) MCG/ACT inhaler Inhale 2 puffs into the lungs every 4 (four) hours as needed for wheezing or shortness of breath.  ? amLODipine (NORVASC) 10 MG tablet Take 1 tablet (10 mg total) by mouth daily.  ? aspirin EC 81 MG tablet Take 1 tablet (81 mg total) by mouth daily. Swallow whole.  ? atorvastatin (LIPITOR) 80 MG tablet Take 1 tablet (80 mg total) by mouth daily.  ? bictegravir-emtricitabine-tenofovir AF (BIKTARVY) 50-200-25 MG TABS tablet TAKE 1 TABLET BY MOUTH DAILY.  ? calcitRIOL (ROCALTROL) 0.25 MCG capsule Take 1 capsule (0.25 mcg total) by mouth Every Tuesday,Thursday,and Saturday with dialysis.  ? calcium acetate (PHOSLO) 667 MG capsule Take 3 capsules (2,001 mg total) by mouth 3 (three) times daily with meals.  ? clopidogrel (PLAVIX) 75 MG tablet Take 1 tablet (75 mg total) by mouth daily.  ? cyclobenzaprine (FLEXERIL) 5 MG tablet Take 1 tablet (5 mg total) by mouth 2 (two) times daily as needed for muscle spasms.  ? hydrALAZINE (APRESOLINE) 25 MG tablet Take 3 tablets (75  mg total) by mouth 3 (three) times daily. (Patient taking differently: Take 50-75 mg by mouth See admin instructions. Takes 75 mg 3 times on Monday Wednesday Friday and 50 mg 3 times on all other days.)  ? insulin glargine (LANTUS SOLOSTAR) 100 UNIT/ML Solostar Pen Inject 8 Units into the skin at bedtime. (Patient taking differently: Inject 15 Units into the skin at bedtime.)  ? liraglutide (VICTOZA) 18 MG/3ML SOPN Inject 0.6 mg into the skin daily. (Patient taking differently: Inject 1.6 mg into the skin daily.)  ? loperamide (IMODIUM A-D) 2 MG tablet Take 2 mg by mouth 4  (four) times daily as needed for diarrhea or loose stools.  ? multivitamin (RENA-VIT) TABS tablet Take 1 tablet by mouth daily. (Patient taking differently: Take 1 tablet by mouth See admin instructions. On Tuesday, Wednesday and Thursday)  ? polyethylene glycol (MIRALAX / GLYCOLAX) 17 g packet Take 17 g by mouth 2 (two) times daily. (Patient taking differently: Take 17 g by mouth daily as needed for mild constipation or moderate constipation.)  ? propranolol (INDERAL) 20 MG tablet Take 1 tablet (20 mg total) by mouth every 8 (eight) hours.  ? ? ? ?Allergies: ?Allergies as of 11/09/2021  ? (No Known Allergies)  ? ?Past Medical History:  ?Diagnosis Date  ? Anal abscess   ? chronic  ? Anxiety   ? CAD (coronary artery disease)   ? CVA (cerebral vascular accident) [facility])   ? Depression 06/28/2006  ? Qualifier: Diagnosis of  By: Riccardo Dubin MD, Sherren Mocha    ? Diabetes mellitus type 2 in obese ([place]) 06/28/1994  ? Dyspnea   ? uses oxygeb 2 liters per minute at dialysis  ? End stage renal disease on dialysis [facility])   ? on hemodialysis T/Th/Sat  ? Erosive esophagitis   ? Esophageal reflux   ? Eye redness   ? Gastroparesis   ? ? diabetic  ? GERD (gastroesophageal reflux disease)   ? History of blood transfusion 2019  ? Human immunodeficiency virus (HIV) disease ([place]) 04/23/2016  ? Hyperlipidemia   ? Hypertension   ? Metabolic bone disease 51/09/5850  ? Moderate nonproliferative diabetic retinopathy of both eyes ([place]) 11/21/2014  ? 11/14/14: Noted on retinal imaging; needs follow-up imaging in 6 months  05/22/16: Noted on retinal imaging again; needs follow-up imaging in 6 months  ? PAD (peripheral artery disease) ([place])   ? Type 2 diabetes mellitus with diabetic peripheral angiopathy without gangrene ([place]) 05/01/2019  ? Wears dentures   ? lower  ? Wound infection s/p L transmetatarsal amputation   ? ? ?Family History: Mother: Diabetes; brother; diabetes ? ?Social History: Patient is disabled and lives with her 2 daughters.  Patient  smokes 1 pack/day for 10 years and quit in 2016.  Drinks alcohol occasionally.  Marijuana use daily.  Denies any other illicit drug use. ? ?Review of Systems: ?A complete ROS was negative except as per HPI.  ? ?Physical Exam: ?Blood pressure (!) 165/88, pulse 92, temperature 98.2 ?F (36.8 ?C), temperature source Oral, resp. rate 19, SpO2 100 %. ?Physical Exam ?Constitutional:   ?   General: She is not in acute distress. ?   Appearance: She is normal weight.  ?HENT:  ?   Head: Normocephalic and atraumatic.  ?Cardiovascular:  ?   Rate and Rhythm: Tachycardia present.  ?   Heart sounds: S1 normal and S2 normal.  ?   Comments: Audible bruit from dialysis port ?Pulmonary:  ?   Effort: Pulmonary effort is normal.  ?  Breath sounds: Examination of the right-middle field reveals decreased breath sounds. Examination of the right-lower field reveals decreased breath sounds. Decreased breath sounds present. No wheezing, rhonchi or rales.  ?Chest:  ?   Comments: Reproducible chest pain of the chest wall, lower sternal. ?Abdominal:  ?   General: Bowel sounds are decreased.  ?   Palpations: Abdomen is soft.  ?   Tenderness: There is generalized abdominal tenderness.  ?   Comments: Diffuse abdominal tenderness  ?Musculoskeletal:  ?   Right Lower Extremity: Right leg is amputated below knee.  ?   Left Lower Extremity: Left leg is amputated above knee.  ?Skin: ?   General: Skin is warm and dry.  ?Neurological:  ?   General: No focal deficit present.  ?   Mental Status: She is alert.  ?Psychiatric:     ?   Behavior: Behavior is cooperative.     ?   Cognition and Memory: Cognition normal.  ? ? ? ?EKG: personally reviewed my interpretation is normal sinus rhythm, prolonged QT ? ?DG Chest 2 View ? ?Result Date: 11/09/2021 ?CLINICAL DATA:  Chest pain EXAM: CHEST - 2 VIEW COMPARISON:  07/10/2021 FINDINGS: Aortic atherosclerotic calcifications. The heart size and mediastinal contours are within normal limits. Both lungs are clear. The  visualized skeletal structures are unremarkable. IMPRESSION: No active cardiopulmonary abnormalities. Electronically Signed   By: Kerby Moors M.D.   On: 11/09/2021 13:34  ? ?CT Head Wo Contrast ? ?Result Date: 4/

## 2021-11-09 NOTE — Consult Note (Signed)
Reason for Consult: Continuity of ESRD care, malignant hypertension ?Referring Physician: Godfrey Pick, MD (EDP) ? 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  She usually gets her hemodialysis on a TTS schedule however, was not feeling well yesterday and missed her scheduled treatment.  She presented to the emergency room today with assorted complaints including left-sided flank pain, chest pain, nausea/vomiting and some transient shortness of breath with exertion.  In the emergency room, she was found to have significantly elevated blood pressures (235/119) that have not improved with administration of her oral antihypertensive medications and she remains on nitroglycerin drip.  Imaging was negative for any pulmonary edema. ? ?She denies any fevers or chills and does not have any cough or sputum production.  She denies any focal wounds. ? ?Hemodialysis prescription: ?[facility], TTS, EDW 69.5 kg, 4 hours, BFR 400/DFR 600, 2K/3.0 calcium, left upper arm AV fistula, 15 G needles, no heparin, calcitriol 1 mcg 3 times weekly, clonidine 0.1 mg as needed ? ?Past Medical History:  ?Diagnosis Date  ? Anal abscess   ? chronic  ? Anxiety   ? CAD (coronary artery disease)   ? CVA (cerebral vascular accident) [facility])   ? Depression 06/28/2006  ? Qualifier: Diagnosis of  By: Riccardo Dubin MD, Sherren Mocha    ? Diabetes mellitus type 2 in obese ([place]) 06/28/1994  ? Dyspnea   ? uses oxygeb 2 liters per minute at dialysis  ? End stage renal disease on dialysis [facility])   ? on hemodialysis T/Th/Sat  ? Erosive esophagitis   ? Esophageal reflux   ? Eye redness   ? Gastroparesis   ? ? diabetic  ? GERD (gastroesophageal reflux disease)   ? History of blood  transfusion 2019  ? Human immunodeficiency virus (HIV) disease ([place]) 04/23/2016  ? Hyperlipidemia   ? Hypertension   ? Metabolic bone disease 99/83/3825  ? Moderate nonproliferative diabetic retinopathy of both eyes ([place]) 11/21/2014  ? 11/14/14: Noted on retinal imaging; needs follow-up imaging in 6 months  05/22/16: Noted on retinal imaging again; needs follow-up imaging in 6 months  ? PAD (peripheral artery disease) ([place])   ? Type 2 diabetes mellitus with diabetic peripheral angiopathy without gangrene ([place]) 05/01/2019  ? Wears dentures   ? lower  ? Wound infection s/p L transmetatarsal amputation   ? ? ?Past Surgical History:  ?Procedure Laterality Date  ? ABDOMINAL AORTOGRAM W/LOWER EXTREMITY N/A 12/13/2020  ? Procedure: ABDOMINAL AORTOGRAM W/LOWER EXTREMITY;  Surgeon: Angelia Mould, MD;  Location: [place] CV LAB;  Service: Cardiovascular;  Laterality: N/A;  ? AMPUTATION Left 07/08/2018  ? Procedure: AMPUTATION FORTH RAY LEFT FOOT;  Surgeon: [name], MD;  Location: [place];  Service: Orthopedics;  Laterality: Left;  ? AMPUTATION Left 08/09/2018  ? Procedure: Left Transmetatarsal Amputation;  Surgeon: [name], MD;  Location: [place];  Service: Orthopedics;  Laterality: Left;  ? AMPUTATION Left 10/08/2018  ? Procedure: LEFT BELOW KNEE AMPUTATION;  Surgeon: [name], MD;  Location: [place];  Service: Orthopedics;  Laterality: Left;  ? AMPUTATION Left 10/28/2018  ? Procedure: REVISION BELOW KNEE AMPUTATION;  Surgeon: [name], MD;  Location: [place];  Service: Orthopedics;  Laterality: Left;  ? AMPUTATION Left 12/16/2018  ? Procedure: LEFT ABOVE KNEE AMPUTATION;  Surgeon: [name], MD;  Location: [place];  Service: Orthopedics;  Laterality: Left;  ? AMPUTATION Right 01/31/2021  ? Procedure: AMPUTATION BELOW KNEE;  Surgeon: Serafina Mitchell, MD;  Location: [facility] OR;  Service: Vascular;  Laterality: Right;  ? AMPUTATION Right 03/28/2021  ? Procedure: REVISION OF RIGHT BELOW KNEE AMPUTATION;   Surgeon: Serafina Mitchell, MD;  Location: [place];  Service: Vascular;  Laterality: Right;  ? AMPUTATION Right 04/18/2021  ? Procedure: right below knee amputation/washout placement wound vac;  Surgeon: Cherre Robins, MD;  Location[facility] OR;  Service: Vascular;  Laterality: Right;  ? AV FISTULA PLACEMENT Left 10/14/2018  ? Procedure: Arteriovenous (Av) Fistula Creation Left Arm;  Surgeon: [name], MD;  Location: [place];  Service: Vascular;  Laterality: Left;  ? BASCILIC VEIN TRANSPOSITION Left 04/14/2019  ? Procedure: BASILIC VEIN TRANSPOSITION SECOND STAGE LEFT ARM;  Surgeon: Rosetta Posner, MD;  Location: [place];  Service: Vascular;  Laterality: Left;  ? CORONARY STENT INTERVENTION N/A 05/21/2021  ? Procedure: CORONARY STENT INTERVENTION;  Surgeon: Leonie Man, MD;  Location: [place] CV LAB;  Service: Cardiovascular;  Laterality: N/A;  ? INCISION AND DRAINAGE ABSCESS N/A 10/24/2020  ? Procedure: exicision of hydradenitis;  Surgeon: Leighton Ruff, MD;  Location[facility]  Service: General;  Laterality: N/A;  45 min  ? INSERTION OF DIALYSIS CATHETER Right 04/14/2019  ? Procedure: INSERTION OF DIALYSIS CATHETER;  Surgeon: Rosetta Posner, MD;  Location: [place];  Service: Vascular;  Laterality: Right;  ? LEFT HEART CATH AND CORONARY ANGIOGRAPHY N/A 05/21/2021  ? Procedure: LEFT HEART CATH AND CORONARY ANGIOGRAPHY;  Surgeon: Leonie Man, MD;  Location: [place] CV LAB;  Service: Cardiovascular;  Laterality: N/A;  ? LOWER EXTREMITY ANGIOGRAPHY N/A 07/05/2018  ? Procedure: LOWER EXTREMITY ANGIOGRAPHY;  Surgeon: Serafina Mitchell, MD;  Location: [place] CV LAB;  Service: Cardiovascular;  Laterality: N/A;  ? PERIPHERAL VASCULAR ATHERECTOMY Right 12/13/2020  ? Procedure: PERIPHERAL VASCULAR ATHERECTOMY;  Surgeon: Angelia Mould, MD;  Location: [place] CV LAB;  Service: Cardiovascular;  Laterality: Right;  Superficial femoral  ? PERIPHERAL VASCULAR BALLOON ANGIOPLASTY Left  07/05/2018  ? Procedure: PERIPHERAL VASCULAR BALLOON ANGIOPLASTY;  Surgeon: Serafina Mitchell, MD;  Location: [place] CV LAB;  Service: Cardiovascular;  Laterality: Left;  SFA  ? PERIPHERAL VASCULAR BALLOON ANGIOPLASTY Right 12/13/2020  ? Procedure: PERIPHERAL VASCULAR BALLOON ANGIOPLASTY;  Surgeon: Angelia Mould, MD;  Location: [place] CV LAB;  Service: Cardiovascular;  Laterality: Right;  Peroneal artery, anterior tibial artery  ? RECTAL EXAM UNDER ANESTHESIA N/A 10/24/2020  ? Procedure: EXAM UNDER ANESTHESIA;  Surgeon: Leighton Ruff, MD;  Location: [facility];  Service: General;  Laterality: N/A;  ? STUMP REVISION Left 10/19/2018  ? Procedure: REVISION LEFT BELOW KNEE AMPUTATION;  Surgeon: [name], MD;  Location: [place];  Service: Orthopedics;  Laterality: Left;  ? TUBAL LIGATION  2002  ? ? ?Family History  ?Problem Relation Age of Onset  ? Diabetes Mother   ? Diabetes Brother   ? Diabetes Daughter   ? Diabetes Daughter   ? Mental retardation Brother   ?     died from PNA  ? Diabetes Maternal Grandmother   ? ? ?Social History:  reports that she quit smoking about 7 years ago. Her smoking use included cigarettes. She started smoking about 7 years ago. She has a 1.00 pack-year smoking history. She has never used smokeless  tobacco. She reports that she does not currently use alcohol. She reports that she does not currently use drugs after having used the following drugs: Marijuana. ? ?Allergies: No Known Allergies ? ?Medications: I have reviewed the patient's current medications. ?Prior to Admission: (Not in a hospital admission)  ?Scheduled: ? amLODipine  10 mg Oral Daily  ? bictegravir-emtricitabine-tenofovir AF  1 tablet Oral Daily  ? [START ON 11/10/2021] Chlorhexidine Gluconate Cloth  6 each Topical Q0600  ? clopidogrel  75 mg Oral Daily  ? hydrALAZINE  75 mg Oral TID  ? ? ?  Latest Ref Rng & Units 11/09/2021  ?  2:24 PM 07/10/2021  ?  1:00 PM 07/10/2021  ?  7:15 AM  ?BMP  ?Glucose  70 - 99 mg/dL 141    111    ?BUN 6 - 20 mg/dL 73    99    ?Creatinine 0.44 - 1.00 mg/dL 10.29    12.12    ?Sodium 135 - 145 mmol/L 139    142    ?Potassium 3.5 - 5.1 mmol/L 5.0   3.3   5.3    ?Chloride 98 - 111 mmo

## 2021-11-09 NOTE — ED Notes (Signed)
Pt off unit to CT

## 2021-11-09 NOTE — ED Triage Notes (Addendum)
Pt reports L lateral side pain, chest pain, vomiting, and SOB since yesterday.  States she believes she is dehydrated.  Last dialysis Thursday.  Didn't go yesterday because she felt bad. ?

## 2021-11-10 DIAGNOSIS — I169 Hypertensive crisis, unspecified: Secondary | ICD-10-CM | POA: Diagnosis not present

## 2021-11-10 DIAGNOSIS — I161 Hypertensive emergency: Secondary | ICD-10-CM | POA: Diagnosis not present

## 2021-11-10 LAB — IRON AND TIBC
Iron: 68 ug/dL (ref 28–170)
Saturation Ratios: 25 % (ref 10.4–31.8)
TIBC: 276 ug/dL (ref 250–450)
UIBC: 208 ug/dL

## 2021-11-10 LAB — COMPREHENSIVE METABOLIC PANEL
ALT: 8 U/L (ref 0–44)
AST: 11 U/L — ABNORMAL LOW (ref 15–41)
Albumin: 3.2 g/dL — ABNORMAL LOW (ref 3.5–5.0)
Alkaline Phosphatase: 53 U/L (ref 38–126)
Anion gap: 20 — ABNORMAL HIGH (ref 5–15)
BUN: 72 mg/dL — ABNORMAL HIGH (ref 6–20)
CO2: 18 mmol/L — ABNORMAL LOW (ref 22–32)
Calcium: 6.6 mg/dL — ABNORMAL LOW (ref 8.9–10.3)
Chloride: 98 mmol/L (ref 98–111)
Creatinine, Ser: 10.25 mg/dL — ABNORMAL HIGH (ref 0.44–1.00)
GFR, Estimated: 4 mL/min — ABNORMAL LOW (ref >60)
Glucose, Bld: 184 mg/dL — ABNORMAL HIGH (ref 70–99)
Potassium: 4.1 mmol/L (ref 3.5–5.1)
Sodium: 136 mmol/L (ref 135–145)
Total Bilirubin: 0.6 mg/dL (ref 0.3–1.2)
Total Protein: 7.8 g/dL (ref 6.5–8.1)

## 2021-11-10 LAB — CBC
HCT: 29.9 % — ABNORMAL LOW (ref 36.0–46.0)
Hemoglobin: 9.5 g/dL — ABNORMAL LOW (ref 12.0–15.0)
MCH: 28.7 pg (ref 26.0–34.0)
MCHC: 31.8 g/dL (ref 30.0–36.0)
MCV: 90.3 fL (ref 80.0–100.0)
Platelets: 247 10*3/uL (ref 150–400)
RBC: 3.31 MIL/uL — ABNORMAL LOW (ref 3.87–5.11)
RDW: 16.5 % — ABNORMAL HIGH (ref 11.5–15.5)
WBC: 5.7 10*3/uL (ref 4.0–10.5)
nRBC: 0 % (ref 0.0–0.2)

## 2021-11-10 LAB — HEPATITIS B SURFACE ANTIBODY,QUALITATIVE: Hep B S Ab: REACTIVE — AB

## 2021-11-10 LAB — GLUCOSE, CAPILLARY
Glucose-Capillary: 154 mg/dL — ABNORMAL HIGH (ref 70–99)
Glucose-Capillary: 108 mg/dL — ABNORMAL HIGH (ref 70–99)
Glucose-Capillary: 135 mg/dL — ABNORMAL HIGH (ref 70–99)
Glucose-Capillary: 200 mg/dL — ABNORMAL HIGH (ref 70–99)

## 2021-11-10 LAB — TROPONIN I (HIGH SENSITIVITY): Troponin I (High Sensitivity): 74 ng/L — ABNORMAL HIGH (ref <18)

## 2021-11-10 LAB — HEPATITIS B SURFACE ANTIGEN: Hepatitis B Surface Ag: NONREACTIVE

## 2021-11-10 LAB — TSH: TSH: 1.65 u[IU]/mL (ref 0.350–4.500)

## 2021-11-10 LAB — FERRITIN: Ferritin: 370 ng/mL — ABNORMAL HIGH (ref 11–307)

## 2021-11-10 MED ORDER — SODIUM CHLORIDE 0.9 % IV SOLN: 100.0000 mL | INTRAVENOUS | Status: DC | PRN

## 2021-11-10 MED ORDER — PENTAFLUOROPROP-TETRAFLUOROETH EX AERO: 1.0000 "application " | INHALATION_SPRAY | CUTANEOUS | Status: DC | PRN

## 2021-11-10 MED ORDER — ASPIRIN EC 81 MG PO TBEC
81.0000 mg | DELAYED_RELEASE_TABLET | Freq: Every day | ORAL | Status: DC
  Administered 2021-11-10 – 2021-11-11 (×2): 81 mg via ORAL
  Filled 2021-11-10 (×2): qty 1

## 2021-11-10 MED ORDER — HYDRALAZINE HCL 50 MG PO TABS
50.0000 mg | ORAL_TABLET | Freq: Four times a day (QID) | ORAL | Status: DC
  Administered 2021-11-10 – 2021-11-11 (×5): 50 mg via ORAL
  Filled 2021-11-10 (×5): qty 1

## 2021-11-10 MED ORDER — ACETAMINOPHEN 500 MG PO TABS
1000.0000 mg | ORAL_TABLET | Freq: Four times a day (QID) | ORAL | Status: DC | PRN
  Administered 2021-11-10 (×2): 1000 mg via ORAL
  Filled 2021-11-10 (×2): qty 2

## 2021-11-10 MED ORDER — ATORVASTATIN CALCIUM 80 MG PO TABS
80.0000 mg | ORAL_TABLET | Freq: Every day | ORAL | Status: DC
  Administered 2021-11-10 – 2021-11-11 (×2): 80 mg via ORAL
  Filled 2021-11-10 (×2): qty 1

## 2021-11-10 MED ORDER — ONDANSETRON HCL 4 MG/2ML IJ SOLN
4.0000 mg | Freq: Once | INTRAMUSCULAR | Status: AC | PRN
  Administered 2021-11-10: 4 mg via INTRAVENOUS
  Filled 2021-11-10: qty 2

## 2021-11-10 MED ORDER — LORAZEPAM 2 MG/ML IJ SOLN
0.5000 mg | Freq: Once | INTRAMUSCULAR | Status: AC
  Administered 2021-11-10: 0.5 mg via INTRAVENOUS
  Filled 2021-11-10: qty 1

## 2021-11-10 MED ORDER — LABETALOL HCL 5 MG/ML IV SOLN: 5.0000 mg | INTRAVENOUS | Status: DC | PRN

## 2021-11-10 MED ORDER — NITROGLYCERIN IN D5W 200-5 MCG/ML-% IV SOLN: 0.0000 ug/min | INTRAVENOUS | Status: DC

## 2021-11-10 MED ORDER — HYDROMORPHONE HCL 1 MG/ML IJ SOLN
1.0000 mg | Freq: Once | INTRAMUSCULAR | Status: AC
  Administered 2021-11-10: 1 mg via INTRAVENOUS
  Filled 2021-11-10: qty 1

## 2021-11-10 MED ORDER — ACETAMINOPHEN 650 MG RE SUPP: 650.0000 mg | Freq: Four times a day (QID) | RECTAL | Status: DC | PRN

## 2021-11-10 MED ORDER — LORAZEPAM 2 MG/ML IJ SOLN: 0.2500 mg | Freq: Once | INTRAMUSCULAR | Status: DC

## 2021-11-10 MED ORDER — LIDOCAINE-PRILOCAINE 2.5-2.5 % EX CREA: 1.0000 "application " | TOPICAL_CREAM | CUTANEOUS | Status: DC | PRN

## 2021-11-10 MED ORDER — LIDOCAINE HCL (PF) 1 % IJ SOLN: 5.0000 mL | INTRAMUSCULAR | Status: DC | PRN

## 2021-11-10 NOTE — Progress Notes (Addendum)
[facility] ?Progress Note ? ?Subjective: seen in room, BP's better , 150/ 80 range ? ?Vitals:  ? 11/10/21 1115 11/10/21 1130 11/10/21 1200 11/10/21 1230  ?BP: (!) 155/88 (!) 169/90 (!) 161/87 124/78  ?Pulse: 88 89 89 92  ?Resp: 14 15 15 15   ?Temp:      ?TempSrc:      ?SpO2:      ?Weight:      ? ? ?Exam: ?Gen alert, no distress ?No rash, cyanosis or gangrene ?Sclera anicteric, throat clear  ?No jvd or bruits ?Chest clear bilat to bases, no rales/ wheezing ?RRR no MRG ?Abd soft ntnd no mass or ascites +bs ?GU defer ?MS no joint effusions or deformity ?Ext R + L BKA, no LE edema, no wounds or ulcers ?Neuro is alert, Ox 3 , nf ? LUA AVF+bruit ? ? ? ? ? OP HD: TTS [facility] ? 4h  69.5kg  400/600   2K/3.0 bath   LUA AVF  15 ga  Hep none ? - on 11/10/21 > hep B Ag was neg and hep B Ab's were high/ protective ? - calc 1 ug tiw po ? - clonidine 0.1 prn ? ? ?Assessment/ Plan: ?Severe HTN , uncontrolled- w/ N/V, HA, chest tightness and SOB. Today looks better w/ po meds and IV NTG drip. Will dc IV ntg as we cannot take pt up to HD on this medication. Get vol down, she is up 6-7kg by wts even though not much vol ^ by exam. Max UF w/ HD ?ESRD - on HD TTS. Missed HD x 1 d/t transportation problems. Extra HD today. Will need HD tomorrow as well, 1st shift here as we were only able to get off 2.5 L today.  ?Anemia esrd - Hb stable, follow ?MBD ckd - cont phos binders, vdra  ?HIV - per pmd ?DM2 - takes insulin ?CAD hx stenting ?PAD sp bilat BKA ? ? ? ? ?Rob Mychal Durio ?11/10/2021, 1:04 PM ? ? ?Recent Labs  ?Lab 11/09/21 ?1424 11/10/21 ?0024  ?HGB 10.7* 9.5*  ?ALBUMIN 3.5 3.2*  ?CALCIUM 7.0* 6.6*  ?CREATININE 10.29* 10.25*  ?K 5.0 4.1  ? ?Inpatient medications: ? amLODipine  10 mg Oral Daily  ? bictegravir-emtricitabine-tenofovir AF  1 tablet Oral Daily  ? Chlorhexidine Gluconate Cloth  6 each Topical Q0600  ? clopidogrel  75 mg Oral Daily  ? heparin  5,000 Units Subcutaneous Q8H  ? hydrALAZINE  50 mg Oral QID  ?  HYDROmorphone  (DILAUDID) injection  1 mg Intravenous Once  ? insulin aspart  0-9 Units Subcutaneous TID WC  ? pantoprazole  40 mg Oral Daily  ? propranolol  10 mg Oral TID  ? ? sodium chloride    ? sodium chloride    ? ?sodium chloride, sodium chloride, acetaminophen **OR** acetaminophen, lidocaine (PF), lidocaine-prilocaine, pentafluoroprop-tetrafluoroeth ? ? ? ? ? ? ?

## 2021-11-10 NOTE — Procedures (Signed)
? ?  I was present at this dialysis session, have reviewed the session itself and made  appropriate changes ?Kelly Splinter MD ?[employer] ?pager [phone]   ?11/10/2021, 1:13 PM ? ? ?

## 2021-11-10 NOTE — Plan of Care (Signed)

## 2021-11-10 NOTE — Care Management Obs Status (Signed)
MEDICARE OBSERVATION STATUS NOTIFICATION ? ? ?Patient Details  ?Name: Kathy Frank ?MRN: [id number] ?Date of Birth: 05-Jul-1976 ? ? ?Medicare Observation Status Notification Given:  Yes ? ? ? ?Zenon Mayo, RN ?11/10/2021, 5:27 PM ?

## 2021-11-10 NOTE — Progress Notes (Signed)
? ?Subjective:  ?Overnight Events: Overnight admit, started on nitroglycerin gtt, had improvement in blood pressures.  Drip was discontinued and then patient had subsequent increase in pressures again requiring restart of drip. ? ?Patient was seen and examined on rounds.  Patient has headache this morning.  Still hypertensive 140s-160s this morning.  No nausea or vomiting this morning.  Feels she can tolerate dialysis today.  At home she takes amlodipine and hydralazine for blood pressure and she endorses adherence to these.  Has also been taking propranolol daily.  Only missed HD session per patient was Saturday.  Discussed with patient she has severe elevations in her blood pressure and on nitroglycerin, both of which can cause headache.  Hopefully these will improve with HD. ? ?Objective: ? ?Vital signs in last 24 hours: ?Vitals:  ? 11/10/21 1300 11/10/21 1330 11/10/21 1400 11/10/21 1540  ?BP: (!) 180/92 (!) 175/93 (!) 173/87 (!) 197/106  ?Pulse: 98 95 99 87  ?Resp: 13 13 (!) 24 12  ?Temp:    97.7 ?F (36.5 ?C)  ?TempSrc:    Oral  ?SpO2:      ?Weight:      ? ? ? ?Intake/Output Summary (Last 24 hours) at 11/10/2021 1541 ?Last data filed at 11/10/2021 2706 ?Gross per 24 hour  ?Intake 832.73 ml  ?Output --  ?Net 832.73 ml  ? ? ?Physical Exam: ?General: Uncomfortable appearing African-American female, overweight, holding head in hands, NAD ?HENT: External nares and ears appear normal ?EYES: conjunctiva non-erythematous, no scleral icterus ?CV: Tachycardic, normal rhythm, no murmurs, rubs, gallops.  2+ pitting edema dependent upper thighs ?Pulmonary: normal work of breathing on RA, lungs clear to auscultation, no rales, wheezes, rhonchi ?Abdominal: non-distended, soft, non-tender to palpation, normal BS ?Skin: Warm and dry, no rashes or lesions on exposed surfaces ?Neurological: ?MS: awake, alert and oriented x3, normal speech and fund of knowledge ?Motor: moves all extremities antigravity ?Psych: normal  affect ? ? ?Assessment/Plan: ? ?Principal Problem: ?  Hypertensive emergency ?Active Problems: ?  Diabetes mellitus type 2 in obese [facility]) ?  ESRD (end stage renal disease) ([place]) ? ?Kathy Frank is a 46 year old female with a past medical history of CAD s/p stent, CVA, PAD, HTN, ESRD on dialysis, HIV, insulin-dependent type 2 diabetes presenting with complaint of nausea, vomiting, missed HD session who was found to have severely elevated blood pressures and admitted for severe symptomatic hypertension. ? ?#Severe Symptomatic Hypertension ?Patient's blood pressures remained mildly elevated this morning despite being on nitroglycerin drip 150.  Patient having headache, denies abdominal pain, nausea, chest pain. Patient was taken off the drip given improvement in BPs from last night in order to attend HD session in dialysis unit.  Patient weighs 6-7 kg above dry weight.  Has pitting edema dependent bilateral thighs. Suspect getting volume off during HD will help with blood pressures.  There was a delay in patient getting her p.o. antihypertensives this morning.  Patient is endorsing adherence to antihypertensives and HD schedule however with such severely elevated blood pressures suspect some degree of nonadherence.  Missed doses of hydralazine can cause rebound hypertension which may be contributing.  Update: Only able to get off 2.5 L and HD today, patient will need second HD session and patient tomorrow morning.  Has persistently high blood pressures as high as 237 systolic. ?Plan: ?-HD today and tomorrow morning ?-Restarted amlodipine 10 mg daily ?-Change hydralazine to 50 mg 4 times daily ?-Also on propranolol 10 mg 3 times daily ? ?#Palpitations ?Has been  worked up by cardiology at recent [place].  Unclear etiology of palpitations.  TSH on admission normal.  Cardiology suspects anxiety may be contributing.  Was started on propranolol outpatient.  We will continue propranolol this admission. ? ?#ESRD on HD  TTSa ?Missed HD on Saturday due to transportation issues.  Consulted nephrology, appreciate recommendations. Patient had extra HD session today, will also need HD inpatient tomorrow given only able to get off 2.5L today. ? ?#HIV ?January 2023: Viral load undetectable; CD4 count 623. Follows with [facility], Dr. Megan Salon.  Patient was continued on home [place]. ? 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  Home medications include Victoza and Lantus 15 units at bedtime.  CBGs have been appropriate so far this admission. ?Plan: ?--SSI very sensitive scale ?--Resume renal diet ?--A1c pending ? 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  These were continued this hospitalization. ? ?#GERD ?Continued on home Protonix 40 mg daily ? ?#Normocytic anemia of CKD ?On admission, hemoglobin 10.7.  At baseline per chart review.  This is a chronic condition and no active signs of bleeding.  ?-- Continue to monitor ? ?Diet: Regular  ?VTE: Heparin subq ?IVF: None ?Code: Full ? ?Prior to Admission Living Arrangement: Home ?Anticipated Discharge Location: Home ?Barriers to Discharge: Requiring inpatient HD for severe symptomatic hypertension and ESRD patient ?Dispo: Anticipated discharge in approximately 1 day(s).  ? ?Portions of this report may have been transcribed using voice recognition software. Every effort was made to ensure accuracy; however, inadvertent computerized transcription errors may be present.  ? ?Wayland Denis, MD ?11/10/21,  3:41 PM ?Pager: [phone] ?Internal Medicine Resident, PGY-1 ?[facility]  ?

## 2021-11-10 NOTE — TOC Progression Note (Signed)
Transition of Care (TOC) - Progression Note  ? ? ?Patient Details  ?Name: Kathy Frank ?MRN: [id number] ?Date of Birth: Mar 22, 1976 ? ?Transition of Care (TOC) CM/SW Contact  ?Zenon Mayo, RN ?Phone Number: ?11/10/2021, 10:21 AM ? ?Clinical Narrative:    ?She is from home with her daughter, presents with HTN urgency, HD patient, indep, bil BKA, uses a w/chair at home. She states she Internal medicine is her PCP. TOC will continue to follow for dc needs.  ? ? ?Expected Discharge Plan: Home/Self Care ?Barriers to Discharge: Continued Medical Work up ? ?Expected Discharge Plan and Services ?Expected Discharge Plan: Home/Self Care ?In-house Referral: NA ?Discharge Planning Services: CM Consult ?  ?  ?                ?  ?DME Agency: NA ?  ?  ?  ?HH Arranged: NA ?  ?  ?  ?  ? ? ?Social Determinants of Health (SDOH) Interventions ?  ? ?Readmission Risk Interventions ? ?  05/23/2021  ?  2:13 PM 04/22/2021  ?  3:05 PM  ?Readmission Risk Prevention Plan  ?Transportation Screening Complete Complete  ?Medication Review [employer]) Complete Complete  ?PCP or Specialist appointment within 3-5 days of discharge Complete Complete  ?[place] or Home Care Consult Complete Complete  ?SW Recovery Care/Counseling Consult Complete Complete  ?Palliative Care Screening Not Applicable Not Applicable  ?[place] Not Applicable Not Applicable  ? ? ?

## 2021-11-10 NOTE — Progress Notes (Signed)
Pt receives out-pt HD at [facility]) on TTS. Pt arrives at 6:40 for 7:00 chair time. Will assist as needed.  ? ?Melven Sartorius ?Renal Navigator ?[phone] ?

## 2021-11-10 NOTE — Discharge Summary (Deleted)
? ?Subjective:  ?Overnight Events: Overnight admit, started on nitroglycerin gtt, had improvement in blood pressures.  Drip was discontinued and then patient had subsequent increase in pressures again requiring restart of drip. ? ?Patient was seen and examined on rounds.  Patient has headache this morning.  Still hypertensive 140s-160s this morning.  No nausea or vomiting this morning.  Feels she can tolerate dialysis today.  At home she takes amlodipine and hydralazine for blood pressure and she endorses adherence to these.  Has also been taking propranolol daily.  Only missed HD session per patient was Saturday.  Discussed with patient she has severe elevations in her blood pressure and on nitroglycerin, both of which can cause headache.  Hopefully these will improve with HD. ? ?Objective: ? ?Vital signs in last 24 hours: ?Vitals:  ? 11/10/21 1300 11/10/21 1330 11/10/21 1400 11/10/21 1540  ?BP: (!) 180/92 (!) 175/93 (!) 173/87 (!) 197/106  ?Pulse: 98 95 99 87  ?Resp: 13 13 (!) 24 12  ?Temp:    97.7 ?F (36.5 ?C)  ?TempSrc:    Oral  ?SpO2:      ?Weight:      ? ? ? ?Intake/Output Summary (Last 24 hours) at 11/10/2021 1541 ?Last data filed at 11/10/2021 7824 ?Gross per 24 hour  ?Intake 832.73 ml  ?Output --  ?Net 832.73 ml  ? ? ?Physical Exam: ?General: Uncomfortable appearing African-American female, overweight, holding head in hands, NAD ?HENT: External nares and ears appear normal ?EYES: conjunctiva non-erythematous, no scleral icterus ?CV: Tachycardic, normal rhythm, no murmurs, rubs, gallops.  2+ pitting edema dependent upper thighs ?Pulmonary: normal work of breathing on RA, lungs clear to auscultation, no rales, wheezes, rhonchi ?Abdominal: non-distended, soft, non-tender to palpation, normal BS ?Skin: Warm and dry, no rashes or lesions on exposed surfaces ?Neurological: ?MS: awake, alert and oriented x3, normal speech and fund of knowledge ?Motor: moves all extremities antigravity ?Psych: normal  affect ? ? ?Assessment/Plan: ? ?Principal Problem: ?  Hypertensive emergency ?Active Problems: ?  Diabetes mellitus type 2 in obese [facility]) ?  ESRD (end stage renal disease) ([place]) ? ?Kathy Frank is a 46 year old female with a past medical history of CAD s/p stent, CVA, PAD, HTN, ESRD on dialysis, HIV, insulin-dependent type 2 diabetes presenting with complaint of nausea, vomiting, missed HD session who was found to have severely elevated blood pressures and admitted for severe symptomatic hypertension. ? ?#Severe Symptomatic Hypertension ?Patient's blood pressures remained mildly elevated this morning despite being on nitroglycerin drip 150.  Patient having headache, denies abdominal pain, nausea, chest pain. Patient was taken off the drip given improvement in BPs from last night in order to attend HD session in dialysis unit.  Patient weighs 6-7 kg above dry weight.  Has pitting edema dependent bilateral thighs. Suspect getting volume off during HD will help with blood pressures.  There was a delay in patient getting her p.o. antihypertensives this morning.  Patient is endorsing adherence to antihypertensives and HD schedule however with such severely elevated blood pressures suspect some degree of nonadherence.  Missed doses of hydralazine can cause rebound hypertension which may be contributing.  Update: Only able to get off 2.5 L and HD today, patient will need second HD session and patient tomorrow morning.  Has persistently high blood pressures as high as 235 systolic. ?Plan: ?-HD today and tomorrow morning ?-Restarted amlodipine 10 mg daily ?-Change hydralazine to 50 mg 4 times daily ?-Also on propranolol 10 mg 3 times daily ? ?#Palpitations ?Has been  worked up by cardiology at recent [place].  Unclear etiology of palpitations.  TSH on admission normal.  Cardiology suspects anxiety may be contributing.  Was started on propranolol outpatient.  We will continue propranolol this admission. ? ?#ESRD on HD  TTSa ?Missed HD on Saturday due to transportation issues.  Consulted nephrology, appreciate recommendations. Patient had extra HD session today, will also need HD inpatient tomorrow given only able to get off 2.5L today. ? ?#HIV ?January 2023: Viral load undetectable; CD4 count 623. Follows with [facility], Dr. Megan Salon.  Patient was continued on home [place]. ? ?#G2IR complicated by neuropathy  ?Hemoglobin A1c last obtained 04/18/2021; 7.1%.  Home medications include Victoza and Lantus 15 units at bedtime.  CBGs have been appropriate so far this admission. ?Plan: ?--SSI very sensitive scale ?--Resume renal diet ?--A1c pending ? 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  These were continued this hospitalization. ? ?#GERD ?Continued on home Protonix 40 mg daily ? ?#Normocytic anemia of CKD ?On admission, hemoglobin 10.7.  At baseline per chart review.  This is a chronic condition and no active signs of bleeding.  ?-- Continue to monitor ? ?Diet: Regular  ?VTE: Heparin subq ?IVF: None ?Code: Full ? ?Prior to Admission Living Arrangement: Home ?Anticipated Discharge Location: Home ?Barriers to Discharge: Requiring inpatient HD for severe symptomatic hypertension and ESRD patient ?Dispo: Anticipated discharge in approximately 1 day(s).  ? ?Portions of this report may have been transcribed using voice recognition software. Every effort was made to ensure accuracy; however, inadvertent computerized transcription errors may be present.  ? ?Wayland Denis, MD ?11/10/21,  3:41 PM ?Pager: [phone] ?Internal Medicine Resident, PGY-1 ?[facility]  ?  ? ?  ? ? ?

## 2021-11-11 ENCOUNTER — Telehealth: Payer: Self-pay

## 2021-11-11 ENCOUNTER — Other Ambulatory Visit (HOSPITAL_COMMUNITY): Payer: Self-pay

## 2021-11-11 DIAGNOSIS — I161 Hypertensive emergency: Secondary | ICD-10-CM | POA: Diagnosis not present

## 2021-11-11 DIAGNOSIS — N186 End stage renal disease: Secondary | ICD-10-CM

## 2021-11-11 DIAGNOSIS — I169 Hypertensive crisis, unspecified: Secondary | ICD-10-CM | POA: Diagnosis not present

## 2021-11-11 DIAGNOSIS — Z992 Dependence on renal dialysis: Secondary | ICD-10-CM

## 2021-11-11 LAB — GLUCOSE, CAPILLARY
Glucose-Capillary: 137 mg/dL — ABNORMAL HIGH (ref 70–99)
Glucose-Capillary: 148 mg/dL — ABNORMAL HIGH (ref 70–99)
Glucose-Capillary: 120 mg/dL — ABNORMAL HIGH (ref 70–99)
Glucose-Capillary: 132 mg/dL — ABNORMAL HIGH (ref 70–99)

## 2021-11-11 LAB — CBC
HCT: 35.1 % — ABNORMAL LOW (ref 36.0–46.0)
Hemoglobin: 11.3 g/dL — ABNORMAL LOW (ref 12.0–15.0)
MCH: 28.7 pg (ref 26.0–34.0)
MCHC: 32.2 g/dL (ref 30.0–36.0)
MCV: 89.1 fL (ref 80.0–100.0)
Platelets: 271 10*3/uL (ref 150–400)
RBC: 3.94 MIL/uL (ref 3.87–5.11)
RDW: 16.5 % — ABNORMAL HIGH (ref 11.5–15.5)
WBC: 3.9 10*3/uL — ABNORMAL LOW (ref 4.0–10.5)
nRBC: 0 % (ref 0.0–0.2)

## 2021-11-11 LAB — HEMOGLOBIN A1C
Hgb A1c MFr Bld: 8 % — ABNORMAL HIGH (ref 4.8–5.6)
Mean Plasma Glucose: 183 mg/dL

## 2021-11-11 LAB — RENAL FUNCTION PANEL
Albumin: 3.2 g/dL — ABNORMAL LOW (ref 3.5–5.0)
Anion gap: 16 — ABNORMAL HIGH (ref 5–15)
BUN: 28 mg/dL — ABNORMAL HIGH (ref 6–20)
CO2: 24 mmol/L (ref 22–32)
Calcium: 7.7 mg/dL — ABNORMAL LOW (ref 8.9–10.3)
Chloride: 95 mmol/L — ABNORMAL LOW (ref 98–111)
Creatinine, Ser: 6.61 mg/dL — ABNORMAL HIGH (ref 0.44–1.00)
GFR, Estimated: 7 mL/min — ABNORMAL LOW (ref >60)
Glucose, Bld: 115 mg/dL — ABNORMAL HIGH (ref 70–99)
Phosphorus: 6.4 mg/dL — ABNORMAL HIGH (ref 2.5–4.6)
Potassium: 4 mmol/L (ref 3.5–5.1)
Sodium: 135 mmol/L (ref 135–145)

## 2021-11-11 LAB — HEPATITIS B SURFACE ANTIBODY, QUANTITATIVE: Hep B S AB Quant (Post): 77.9 m[IU]/mL (ref >9.9)

## 2021-11-11 LAB — HEPATITIS B DNA, ULTRAQUANTITATIVE, PCR
HBV DNA SERPL PCR-ACNC: NOT DETECTED IU/mL
HBV DNA SERPL PCR-LOG IU: UNDETERMINED log10 IU/mL

## 2021-11-11 MED ORDER — HYDROMORPHONE HCL 1 MG/ML IJ SOLN
1.0000 mg | INTRAMUSCULAR | Status: AC | PRN
  Administered 2021-11-11 (×2): 1 mg via INTRAVENOUS
  Filled 2021-11-11 (×2): qty 1

## 2021-11-11 MED ORDER — HYDRALAZINE HCL 50 MG PO TABS: 50.0000 mg | ORAL_TABLET | Freq: Four times a day (QID) | ORAL | 0 refills | Status: DC

## 2021-11-11 MED ORDER — ONDANSETRON HCL 4 MG/2ML IJ SOLN
4.0000 mg | Freq: Once | INTRAMUSCULAR | Status: AC | PRN
  Administered 2021-11-11: 4 mg via INTRAVENOUS
  Filled 2021-11-11: qty 2

## 2021-11-11 NOTE — Discharge Summary (Signed)
? ?Name: Kathy Frank ?MRN: [id number] ?DOB: 08-Aug-1976 46 y.o. ?PCP: [name], MD ? ?Date of Admission: 11/09/2021 12:58 PM ?Date of Discharge: 11/11/21  ?Attending Physician: Aldine Contes, MD ? ?Discharge Diagnosis: ?1.  Severe symptomatic hypertension ?2.  Palpitations ?3.  ESRD on HD ? ? ?Discharge Medications: ?Allergies as of 11/11/2021   ?No Known Allergies ?  ? ?  ?Medication List  ?  ? ?STOP taking these medications   ? ?cyclobenzaprine 5 MG tablet ?Commonly known as: FLEXERIL ?  ?gabapentin 100 MG capsule ?Commonly known as: NEURONTIN ?  ?pantoprazole 40 MG tablet ?Commonly known as: PROTONIX ?  ? ?  ? ?TAKE these medications   ? ?Accu-Chek Softclix Lancets lancets ?Use as instructed ?  ?amLODipine 10 MG tablet ?Commonly known as: NORVASC ?Take 1 tablet (10 mg total) by mouth daily. ?  ?aspirin EC 81 MG tablet ?Take 1 tablet (81 mg total) by mouth daily. Swallow whole. ?  ?atorvastatin 80 MG tablet ?Commonly known as: LIPITOR ?Take 1 tablet (80 mg total) by mouth daily. ?  ?Biktarvy 50-200-25 MG Tabs tablet ?Generic drug: bictegravir-emtricitabine-tenofovir AF ?TAKE 1 TABLET BY MOUTH DAILY. ?  ?calcitRIOL 0.25 MCG capsule ?Commonly known as: ROCALTROL ?Take 1 capsule (0.25 mcg total) by mouth Every Tuesday,Thursday,and Saturday with dialysis. ?  ?calcium acetate 667 MG capsule ?Commonly known as: PHOSLO ?Take 3 capsules (2,001 mg total) by mouth 3 (three) times daily with meals. ?  ?clopidogrel 75 MG tablet ?Commonly known as: PLAVIX ?Take 1 tablet (75 mg total) by mouth daily. ?  ?glucose blood test strip ?Use as instructed ?  ?hydrALAZINE 50 MG tablet ?Commonly known as: APRESOLINE ?Take 1 tablet (50 mg total) by mouth 4 (four) times daily. ?What changed:  ?medication strength ?how much to take ?when to take this ?  ?Lantus SoloStar 100 UNIT/ML Solostar Pen ?Generic drug: insulin glargine ?Inject 8 Units into the skin at bedtime. ?What changed: how much to take ?  ?liraglutide 18 MG/3ML  Sopn ?Commonly known as: VICTOZA ?Inject 0.6 mg into the skin daily. ?What changed: how much to take ?  ?loperamide 2 MG tablet ?Commonly known as: IMODIUM A-D ?Take 2 mg by mouth 4 (four) times daily as needed for diarrhea or loose stools. ?  ?multivitamin Tabs tablet ?Take 1 tablet by mouth daily. ?What changed:  ?when to take this ?additional instructions ?  ?Pentips 32G X 4 MM Misc ?Generic drug: Insulin Pen Needle ?Use as directed ?  ?polyethylene glycol 17 g packet ?Commonly known as: MIRALAX / GLYCOLAX ?Take 17 g by mouth 2 (two) times daily. ?What changed:  ?when to take this ?reasons to take this ?  ?ProAir HFA 108 (90 Base) MCG/ACT inhaler ?Generic drug: albuterol ?Inhale 2 puffs into the lungs every 4 (four) hours as needed for wheezing or shortness of breath. ?  ?propranolol 20 MG tablet ?Commonly known as: INDERAL ?Take 1 tablet (20 mg total) by mouth every 8 (eight) hours. ?  ?sertraline 50 MG tablet ?Commonly known as: Zoloft ?Take 1 tablet (50 mg total) by mouth at bedtime. ?  ? ?  ? ? ?Disposition and follow-up:   ?[name] was discharged from [facility] in Stable condition.  At the hospital follow up visit please address: ? ?Hypertension-patient presented with severe symptomatic hypertension in the setting of missed HD, suspected some degree of nonadherence to medications.  Hydralazine was increased to 50 mg 4 times daily.  Ensure compliance to hydralazine, amlodipine and propanolol  as well as HD. ? ?Labs / imaging needed at time of follow-up: None ? ?Pending labs/ test needing follow-up: None ? ?Follow-up Appointments: ? Follow-up Information   ? ? [name], MD. Daphane Shepherd on 11/20/2021.   ?Specialty: Internal Medicine ?Why: @3 :15pm ?Contact information: ?[address] ?[place] [address] ?[phone] ? ? ?  ?  ? ? Tobb, Kardie, DO .   ?Specialty: Cardiology ?Contact information: ?[address] ?[address] ?[place] [address] ?[phone] ? ? ?  ?  ? ?  ?  ? ?   ? ? ?Hospital Course by Problem List: ? ?Kathy Frank is a 46 y.o. female with PMHx of CAD s/p stent, CVA, PAD, HTN, ESRD on dialysis, HIV, insulin-dependent type 2 diabetes presenting with complaint of nausea, vomiting, missed HD session who was found to have severely elevated blood pressures and admitted for severe symptomatic hypertension. ? ?#Severe Symptomatic Hypertension ?Patient presented with chest tightness with associated headache, nausea and vomiting with systolic blood pressures greater than 200s in the setting of missed HD.  She was admitted to our service for hypertensive emergency.  She was initially started on nitroglycerin drip and taken for hemodialysis.  Nitroglycerin drip was transitioned off and she was restarted on her home medications of amlodipine hydralazine and propanolol.  Her blood pressures remained uncontrolled so we increased her hydralazine to 4 times daily.  There was concern for nonadherence to medication regimen as well as missed doses of hydralazine can cause rebound hypertension.  She received 2 hemodialysis sessions discharged with plans to continue amlodipine 10 mg, hydralazine 50 mg 4 times daily and propanolol 10 mg 3 times daily.  Please ensure compliance to medications and hemodialysis. ? ?#Palpitations ?Has been worked up by cardiology at recent [place].  Unclear etiology of palpitations.  TSH on admission normal.  Cardiology suspects anxiety may be contributing.  Was started on propranolol outpatient which was continued during hospitalization. ? ?#ESRD on HD TTSa ?Missed HD on Saturday due to transportation issues.  She received an extra hemodialysis session and dialysis on day of discharge back on her Tuesday Thursday Saturday schedule. ?  ?#HIV ?January 2023: Viral load undetectable; CD4 count 623. Follows with [facility], Dr. Megan Salon.  Patient was continued on home [place]. ?  ?#Y0DX complicated by neuropathy  ?Hemoglobin A1c was 8, increased from 7.16 months ago.  Patient  was continued on sliding scale during hospitalization.  Instructed to continue Victoza and Lantus at discharge. ?  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  These were continued this hospitalization. ?  ?#GERD ?Continued on home Protonix 40 mg daily ?  ?#Normocytic anemia of CKD ?On admission, hemoglobin 10.7.  At baseline per chart review.  This is a chronic condition and no active signs of bleeding.  ? ?Subjective on day of discharge: ? ?Discharge Exam:   ?BP 109/81   Pulse (!) 103   Temp 97.8 ?F (36.6 ?C) (Oral)   Resp 17   Wt 71.8 kg   SpO2 97%   BMI 26.34 kg/m?  ?Discharge exam: ?General: Well appearing, NAD ?HENT: normocephalic, atraumatic ?EYES: conjunctiva non-erythematous, no scleral icterus ?CV: regular rate, normal rhythm, no murmurs, rubs, gallops. ?Pulmonary: normal work of breathing on RA, lungs clear to auscultation, no rales, wheezes, rhonchi ?Abdominal: non-distended, soft, non-tender to palpation, normal BS ?Skin: Warm and dry, no rashes or  lesions ?Neurological: ?MS: awake, alert and oriented x3, normal speech and fund of knowledge ?Motor: moves all extremities antigravity ?Psych: normal affect ?  ? ?Pertinent Labs, Studies, and Procedures:  ? ?  Latest Ref Rng & Units 11/11/2021  ?  3:55 AM 11/10/2021  ? 12:24 AM 11/09/2021  ?  2:24 PM  ?CBC  ?WBC 4.0 - 10.5 K/uL 3.9   5.7   6.3    ?Hemoglobin 12.0 - 15.0 g/dL 11.3   9.5   10.7    ?Hematocrit 36.0 - 46.0 % 35.1   29.9   33.6    ?Platelets 150 - 400 K/uL 271   247   261    ? ? ?  Latest Ref Rng & Units 11/11/2021  ?  3:55 AM 11/10/2021  ? 12:24 AM 11/09/2021  ?  2:24 PM  ?CMP  ?Glucose 70 - 99 mg/dL 115   184   141    ?BUN 6 - 20 mg/dL 28   72   73    ?Creatinine 0.44 - 1.00 mg/dL 6.61   10.25   10.29    ?Sodium 135 - 145 mmol/L 135   136   139    ?Potassium 3.5 - 5.1 mmol/L 4.0   4.1   5.0    ?Chloride 98 - 111  mmol/L 95   98   99    ?CO2 22 - 32 mmol/L 24   18   19     ?Calcium 8.9 - 10.3 mg/dL 7.7   6.6   7.0    ?Total Protein 6.5 - 8.1 g/dL  7.8   <3.0    ?Total Bilirubin 0.3 - 1.2 mg/dL  0.6   0.5    ?Alkaline Phos 38 - 126 U

## 2021-11-11 NOTE — Progress Notes (Signed)
Internal Medicine Clinic Attending  Case discussed with Dr. Katsadouros  At the time of the visit.  We reviewed the resident's history and exam and pertinent patient test results.  I agree with the assessment, diagnosis, and plan of care documented in the resident's note.  

## 2021-11-11 NOTE — Progress Notes (Signed)
Patient discharged to home. IV removed. Discharge instructions explained and given to patient. ?

## 2021-11-11 NOTE — Progress Notes (Signed)
[facility] ?Progress Note ? ?Subjective: seen in room, still having a headache. On HD w/ 3 L UF goal ? ?Vitals:  ? 11/11/21 0915 11/11/21 0945 11/11/21 1015 11/11/21 1045  ?BP: 110/69 (!) 144/78 110/71 115/61  ?Pulse: 100 79 99 98  ?Resp: 14 16 17 16   ?Temp:      ?TempSrc:      ?SpO2:      ?Weight:      ? ? ?Exam: ?Gen alert, no distress ?No jvd or bruits ?Chest clear bilat to bases ?RRR no MRG ?Abd soft ntnd no mass or ascites +bs ?Ext R + L BKA, no LE edema ?Neuro is alert, Ox 3 , nf ? LUA AVF+bruit ? ? ? ? ? OP HD: TTS [facility] ? 4h  69.5kg  400/600   2K/3.0 bath   LUA AVF  15 ga  Hep none ? - on 11/10/21 > hep B Ag was neg and hep B Ab's were high/ protective ? - calc 1 ug tiw po ? - clonidine 0.1 prn ? ? ?Assessment/ Plan: ?Severe HTN , uncontrolled- w/ N/V, HA, chest tightness and SOB. Today looks better w/ po meds and IV NTG drip. Was up 6-7kg, prob cause for these problems. Got 2.5 L off yest and plan is for 3 L off today.  ?ESRD - on HD TTS. Missed HD x 1 d/t transportation problems. Extra HD on Monday here. HD again today. Prob can be dc'd after HD, will d/w pmd.  ?Anemia esrd - Hb stable, follow ?MBD ckd - cont phos binders, vdra  ?HIV - per pmd ?DM2 - takes insulin ?CAD hx stenting ?PAD sp bilat BKA ? ? ? ? ?Rob Doctor, hospital ?11/11/2021, 11:34 AM ? ? ?Recent Labs  ?Lab 11/10/21 ?9381 11/11/21 ?0355  ?HGB 9.5* 11.3*  ?ALBUMIN 3.2* 3.2*  ?CALCIUM 6.6* 7.7*  ?PHOS  --  6.4*  ?CREATININE 10.25* 6.61*  ?K 4.1 4.0  ? ? ?Inpatient medications: ? amLODipine  10 mg Oral Daily  ? aspirin EC  81 mg Oral Daily  ? atorvastatin  80 mg Oral Daily  ? bictegravir-emtricitabine-tenofovir AF  1 tablet Oral Daily  ? Chlorhexidine Gluconate Cloth  6 each Topical Q0600  ? clopidogrel  75 mg Oral Daily  ? heparin  5,000 Units Subcutaneous Q8H  ? hydrALAZINE  50 mg Oral QID  ? insulin aspart  0-9 Units Subcutaneous TID WC  ? pantoprazole  40 mg Oral Daily  ? propranolol  10 mg Oral TID  ? ? ? ?acetaminophen **OR**  acetaminophen, HYDROmorphone (DILAUDID) injection ? ? ? ? ? ? ?

## 2021-11-11 NOTE — Plan of Care (Signed)

## 2021-11-11 NOTE — Progress Notes (Signed)
D/C order noted. Pt required inpt HD today per nephrologist note from 4/3. Contacted [facility] to make clinic aware of pt's d/c today and that pt should resume on Thursday.  ? ?Melven Sartorius ?Renal Navigator ?[phone] ?

## 2021-11-11 NOTE — TOC Transition Note (Signed)
Transition of Care (TOC) - CM/SW Discharge Note ? ? ?Patient Details  ?Name: Kathy Frank ?MRN: [id number] ?Date of Birth: 08-21-1975 ? ?Transition of Care (TOC) CM/SW Contact:  ?Zenon Mayo, RN ?Phone Number: ?11/11/2021, 1:49 PM ? ? ?Clinical Narrative:    ?Patient is for dc today, she states her daughter will transport her home at dc.  ? ? ?Final next level of care: Home/Self Care ?Barriers to Discharge: Continued Medical Work up ? ? ?Patient Goals and CMS Choice ?Patient states their goals for this hospitalization and ongoing recovery are:: return home ?  ?Choice offered to / list presented to : NA ? ?Discharge Placement ?  ?           ?  ?  ?  ?  ? ?Discharge Plan and Services ?In-house Referral: NA ?Discharge Planning Services: CM Consult ?           ?  ?DME Agency: NA ?  ?  ?  ?HH Arranged: NA ?  ?  ?  ?  ? ?Social Determinants of Health (SDOH) Interventions ?  ? ? ?Readmission Risk Interventions ? ?  05/23/2021  ?  2:13 PM 04/22/2021  ?  3:05 PM  ?Readmission Risk Prevention Plan  ?Transportation Screening Complete Complete  ?Medication Review Press photographer) Complete Complete  ?PCP or Specialist appointment within 3-5 days of discharge Complete Complete  ?[place] or Home Care Consult Complete Complete  ?SW Recovery Care/Counseling Consult Complete Complete  ?Palliative Care Screening Not Applicable Not Applicable  ?[place] Not Applicable Not Applicable  ? ? ? ? ? ?

## 2021-11-11 NOTE — Telephone Encounter (Signed)
RN from [facility] called requested a HFU for pt being discharged today was given 4/13 @ 3:15 with Dr Marva Panda  ?

## 2021-11-13 ENCOUNTER — Other Ambulatory Visit (HOSPITAL_COMMUNITY): Payer: Self-pay

## 2021-11-13 DIAGNOSIS — D631 Anemia in chronic kidney disease: Secondary | ICD-10-CM | POA: Diagnosis not present

## 2021-11-13 DIAGNOSIS — N2581 Secondary hyperparathyroidism of renal origin: Secondary | ICD-10-CM | POA: Diagnosis not present

## 2021-11-13 DIAGNOSIS — D689 Coagulation defect, unspecified: Secondary | ICD-10-CM | POA: Diagnosis not present

## 2021-11-13 DIAGNOSIS — N186 End stage renal disease: Secondary | ICD-10-CM | POA: Diagnosis not present

## 2021-11-13 DIAGNOSIS — L299 Pruritus, unspecified: Secondary | ICD-10-CM | POA: Diagnosis not present

## 2021-11-13 DIAGNOSIS — Z992 Dependence on renal dialysis: Secondary | ICD-10-CM | POA: Diagnosis not present

## 2021-11-13 DIAGNOSIS — E1110 Type 2 diabetes mellitus with ketoacidosis without coma: Secondary | ICD-10-CM | POA: Diagnosis not present

## 2021-11-13 DIAGNOSIS — R197 Diarrhea, unspecified: Secondary | ICD-10-CM | POA: Diagnosis not present

## 2021-11-13 DIAGNOSIS — R519 Headache, unspecified: Secondary | ICD-10-CM | POA: Diagnosis not present

## 2021-11-18 ENCOUNTER — Telehealth: Payer: Self-pay | Admitting: *Deleted

## 2021-11-18 DIAGNOSIS — N2581 Secondary hyperparathyroidism of renal origin: Secondary | ICD-10-CM | POA: Diagnosis not present

## 2021-11-18 DIAGNOSIS — D689 Coagulation defect, unspecified: Secondary | ICD-10-CM | POA: Diagnosis not present

## 2021-11-18 DIAGNOSIS — R519 Headache, unspecified: Secondary | ICD-10-CM | POA: Diagnosis not present

## 2021-11-18 DIAGNOSIS — D631 Anemia in chronic kidney disease: Secondary | ICD-10-CM | POA: Diagnosis not present

## 2021-11-18 DIAGNOSIS — R197 Diarrhea, unspecified: Secondary | ICD-10-CM | POA: Diagnosis not present

## 2021-11-18 DIAGNOSIS — N186 End stage renal disease: Secondary | ICD-10-CM | POA: Diagnosis not present

## 2021-11-18 DIAGNOSIS — L299 Pruritus, unspecified: Secondary | ICD-10-CM | POA: Diagnosis not present

## 2021-11-18 DIAGNOSIS — E1110 Type 2 diabetes mellitus with ketoacidosis without coma: Secondary | ICD-10-CM | POA: Diagnosis not present

## 2021-11-18 DIAGNOSIS — Z992 Dependence on renal dialysis: Secondary | ICD-10-CM | POA: Diagnosis not present

## 2021-11-18 NOTE — Telephone Encounter (Signed)
[facility]. contacted [phone]) regarding nurse assessment appointment. Patient  appointment was on April 6,2023.  Referral is under review. ?

## 2021-11-20 ENCOUNTER — Encounter: Payer: Medicare Other | Admitting: Internal Medicine

## 2021-11-20 DIAGNOSIS — L299 Pruritus, unspecified: Secondary | ICD-10-CM | POA: Diagnosis not present

## 2021-11-20 DIAGNOSIS — R197 Diarrhea, unspecified: Secondary | ICD-10-CM | POA: Diagnosis not present

## 2021-11-20 DIAGNOSIS — N2581 Secondary hyperparathyroidism of renal origin: Secondary | ICD-10-CM | POA: Diagnosis not present

## 2021-11-20 DIAGNOSIS — Z992 Dependence on renal dialysis: Secondary | ICD-10-CM | POA: Diagnosis not present

## 2021-11-20 DIAGNOSIS — N186 End stage renal disease: Secondary | ICD-10-CM | POA: Diagnosis not present

## 2021-11-20 DIAGNOSIS — D689 Coagulation defect, unspecified: Secondary | ICD-10-CM | POA: Diagnosis not present

## 2021-11-20 DIAGNOSIS — R519 Headache, unspecified: Secondary | ICD-10-CM | POA: Diagnosis not present

## 2021-11-20 DIAGNOSIS — D631 Anemia in chronic kidney disease: Secondary | ICD-10-CM | POA: Diagnosis not present

## 2021-11-20 DIAGNOSIS — E1110 Type 2 diabetes mellitus with ketoacidosis without coma: Secondary | ICD-10-CM | POA: Diagnosis not present

## 2021-11-21 ENCOUNTER — Encounter: Payer: Self-pay | Admitting: Student

## 2021-11-22 DIAGNOSIS — R519 Headache, unspecified: Secondary | ICD-10-CM | POA: Diagnosis not present

## 2021-11-22 DIAGNOSIS — Z992 Dependence on renal dialysis: Secondary | ICD-10-CM | POA: Diagnosis not present

## 2021-11-22 DIAGNOSIS — N186 End stage renal disease: Secondary | ICD-10-CM | POA: Diagnosis not present

## 2021-11-22 DIAGNOSIS — R197 Diarrhea, unspecified: Secondary | ICD-10-CM | POA: Diagnosis not present

## 2021-11-22 DIAGNOSIS — N2581 Secondary hyperparathyroidism of renal origin: Secondary | ICD-10-CM | POA: Diagnosis not present

## 2021-11-22 DIAGNOSIS — L299 Pruritus, unspecified: Secondary | ICD-10-CM | POA: Diagnosis not present

## 2021-11-22 DIAGNOSIS — D689 Coagulation defect, unspecified: Secondary | ICD-10-CM | POA: Diagnosis not present

## 2021-11-22 DIAGNOSIS — D631 Anemia in chronic kidney disease: Secondary | ICD-10-CM | POA: Diagnosis not present

## 2021-11-22 DIAGNOSIS — E1110 Type 2 diabetes mellitus with ketoacidosis without coma: Secondary | ICD-10-CM | POA: Diagnosis not present

## 2021-11-24 ENCOUNTER — Ambulatory Visit: Payer: Medicare Other | Admitting: Cardiology

## 2021-11-25 DIAGNOSIS — E1110 Type 2 diabetes mellitus with ketoacidosis without coma: Secondary | ICD-10-CM | POA: Diagnosis not present

## 2021-11-25 DIAGNOSIS — N186 End stage renal disease: Secondary | ICD-10-CM | POA: Diagnosis not present

## 2021-11-25 DIAGNOSIS — L299 Pruritus, unspecified: Secondary | ICD-10-CM | POA: Diagnosis not present

## 2021-11-25 DIAGNOSIS — R197 Diarrhea, unspecified: Secondary | ICD-10-CM | POA: Diagnosis not present

## 2021-11-25 DIAGNOSIS — D689 Coagulation defect, unspecified: Secondary | ICD-10-CM | POA: Diagnosis not present

## 2021-11-25 DIAGNOSIS — R519 Headache, unspecified: Secondary | ICD-10-CM | POA: Diagnosis not present

## 2021-11-25 DIAGNOSIS — D631 Anemia in chronic kidney disease: Secondary | ICD-10-CM | POA: Diagnosis not present

## 2021-11-25 DIAGNOSIS — N2581 Secondary hyperparathyroidism of renal origin: Secondary | ICD-10-CM | POA: Diagnosis not present

## 2021-11-25 DIAGNOSIS — Z992 Dependence on renal dialysis: Secondary | ICD-10-CM | POA: Diagnosis not present

## 2021-11-26 ENCOUNTER — Telehealth: Payer: Self-pay | Admitting: *Deleted

## 2021-11-26 NOTE — Telephone Encounter (Signed)
RETURNED CALL TO PATIENT REGARDING HER PCS REQUEST. PATIENT  STATES SHE WAS DENIED. SHE IS REQUESTING IF DOCTOR WOULD WRITE LETTER TO HELP HER APPEAL. ?

## 2021-11-27 DIAGNOSIS — R519 Headache, unspecified: Secondary | ICD-10-CM | POA: Diagnosis not present

## 2021-11-27 DIAGNOSIS — R197 Diarrhea, unspecified: Secondary | ICD-10-CM | POA: Diagnosis not present

## 2021-11-27 DIAGNOSIS — D689 Coagulation defect, unspecified: Secondary | ICD-10-CM | POA: Diagnosis not present

## 2021-11-27 DIAGNOSIS — Z992 Dependence on renal dialysis: Secondary | ICD-10-CM | POA: Diagnosis not present

## 2021-11-27 DIAGNOSIS — N2581 Secondary hyperparathyroidism of renal origin: Secondary | ICD-10-CM | POA: Diagnosis not present

## 2021-11-27 DIAGNOSIS — N186 End stage renal disease: Secondary | ICD-10-CM | POA: Diagnosis not present

## 2021-11-27 DIAGNOSIS — E1110 Type 2 diabetes mellitus with ketoacidosis without coma: Secondary | ICD-10-CM | POA: Diagnosis not present

## 2021-11-27 DIAGNOSIS — D631 Anemia in chronic kidney disease: Secondary | ICD-10-CM | POA: Diagnosis not present

## 2021-11-27 DIAGNOSIS — L299 Pruritus, unspecified: Secondary | ICD-10-CM | POA: Diagnosis not present

## 2021-11-28 ENCOUNTER — Encounter: Payer: Self-pay | Admitting: Cardiology

## 2021-11-29 DIAGNOSIS — L299 Pruritus, unspecified: Secondary | ICD-10-CM | POA: Diagnosis not present

## 2021-11-29 DIAGNOSIS — E1110 Type 2 diabetes mellitus with ketoacidosis without coma: Secondary | ICD-10-CM | POA: Diagnosis not present

## 2021-11-29 DIAGNOSIS — D689 Coagulation defect, unspecified: Secondary | ICD-10-CM | POA: Diagnosis not present

## 2021-11-29 DIAGNOSIS — D631 Anemia in chronic kidney disease: Secondary | ICD-10-CM | POA: Diagnosis not present

## 2021-11-29 DIAGNOSIS — R519 Headache, unspecified: Secondary | ICD-10-CM | POA: Diagnosis not present

## 2021-11-29 DIAGNOSIS — R197 Diarrhea, unspecified: Secondary | ICD-10-CM | POA: Diagnosis not present

## 2021-11-29 DIAGNOSIS — Z992 Dependence on renal dialysis: Secondary | ICD-10-CM | POA: Diagnosis not present

## 2021-11-29 DIAGNOSIS — N2581 Secondary hyperparathyroidism of renal origin: Secondary | ICD-10-CM | POA: Diagnosis not present

## 2021-11-29 DIAGNOSIS — N186 End stage renal disease: Secondary | ICD-10-CM | POA: Diagnosis not present

## 2021-12-01 ENCOUNTER — Other Ambulatory Visit (HOSPITAL_COMMUNITY): Payer: Self-pay

## 2021-12-02 DIAGNOSIS — R519 Headache, unspecified: Secondary | ICD-10-CM | POA: Diagnosis not present

## 2021-12-02 DIAGNOSIS — E1110 Type 2 diabetes mellitus with ketoacidosis without coma: Secondary | ICD-10-CM | POA: Diagnosis not present

## 2021-12-02 DIAGNOSIS — D631 Anemia in chronic kidney disease: Secondary | ICD-10-CM | POA: Diagnosis not present

## 2021-12-02 DIAGNOSIS — L299 Pruritus, unspecified: Secondary | ICD-10-CM | POA: Diagnosis not present

## 2021-12-02 DIAGNOSIS — D689 Coagulation defect, unspecified: Secondary | ICD-10-CM | POA: Diagnosis not present

## 2021-12-02 DIAGNOSIS — R197 Diarrhea, unspecified: Secondary | ICD-10-CM | POA: Diagnosis not present

## 2021-12-02 DIAGNOSIS — Z992 Dependence on renal dialysis: Secondary | ICD-10-CM | POA: Diagnosis not present

## 2021-12-02 DIAGNOSIS — N186 End stage renal disease: Secondary | ICD-10-CM | POA: Diagnosis not present

## 2021-12-02 DIAGNOSIS — N2581 Secondary hyperparathyroidism of renal origin: Secondary | ICD-10-CM | POA: Diagnosis not present

## 2021-12-04 DIAGNOSIS — L299 Pruritus, unspecified: Secondary | ICD-10-CM | POA: Diagnosis not present

## 2021-12-04 DIAGNOSIS — N186 End stage renal disease: Secondary | ICD-10-CM | POA: Diagnosis not present

## 2021-12-04 DIAGNOSIS — Z992 Dependence on renal dialysis: Secondary | ICD-10-CM | POA: Diagnosis not present

## 2021-12-04 DIAGNOSIS — N2581 Secondary hyperparathyroidism of renal origin: Secondary | ICD-10-CM | POA: Diagnosis not present

## 2021-12-04 DIAGNOSIS — E1110 Type 2 diabetes mellitus with ketoacidosis without coma: Secondary | ICD-10-CM | POA: Diagnosis not present

## 2021-12-04 DIAGNOSIS — D689 Coagulation defect, unspecified: Secondary | ICD-10-CM | POA: Diagnosis not present

## 2021-12-04 DIAGNOSIS — R197 Diarrhea, unspecified: Secondary | ICD-10-CM | POA: Diagnosis not present

## 2021-12-04 DIAGNOSIS — R519 Headache, unspecified: Secondary | ICD-10-CM | POA: Diagnosis not present

## 2021-12-04 DIAGNOSIS — D631 Anemia in chronic kidney disease: Secondary | ICD-10-CM | POA: Diagnosis not present

## 2021-12-06 DIAGNOSIS — L299 Pruritus, unspecified: Secondary | ICD-10-CM | POA: Diagnosis not present

## 2021-12-06 DIAGNOSIS — R197 Diarrhea, unspecified: Secondary | ICD-10-CM | POA: Diagnosis not present

## 2021-12-06 DIAGNOSIS — Z992 Dependence on renal dialysis: Secondary | ICD-10-CM | POA: Diagnosis not present

## 2021-12-06 DIAGNOSIS — D689 Coagulation defect, unspecified: Secondary | ICD-10-CM | POA: Diagnosis not present

## 2021-12-06 DIAGNOSIS — N2581 Secondary hyperparathyroidism of renal origin: Secondary | ICD-10-CM | POA: Diagnosis not present

## 2021-12-06 DIAGNOSIS — N186 End stage renal disease: Secondary | ICD-10-CM | POA: Diagnosis not present

## 2021-12-06 DIAGNOSIS — R519 Headache, unspecified: Secondary | ICD-10-CM | POA: Diagnosis not present

## 2021-12-06 DIAGNOSIS — D631 Anemia in chronic kidney disease: Secondary | ICD-10-CM | POA: Diagnosis not present

## 2021-12-06 DIAGNOSIS — E1110 Type 2 diabetes mellitus with ketoacidosis without coma: Secondary | ICD-10-CM | POA: Diagnosis not present

## 2021-12-07 DIAGNOSIS — N186 End stage renal disease: Secondary | ICD-10-CM | POA: Diagnosis not present

## 2021-12-07 DIAGNOSIS — Z992 Dependence on renal dialysis: Secondary | ICD-10-CM | POA: Diagnosis not present

## 2021-12-07 DIAGNOSIS — E1129 Type 2 diabetes mellitus with other diabetic kidney complication: Secondary | ICD-10-CM | POA: Diagnosis not present

## 2021-12-09 ENCOUNTER — Other Ambulatory Visit (HOSPITAL_COMMUNITY): Payer: Self-pay

## 2021-12-09 ENCOUNTER — Other Ambulatory Visit: Payer: Self-pay | Admitting: Dietician

## 2021-12-09 DIAGNOSIS — N186 End stage renal disease: Secondary | ICD-10-CM | POA: Diagnosis not present

## 2021-12-09 DIAGNOSIS — L299 Pruritus, unspecified: Secondary | ICD-10-CM | POA: Diagnosis not present

## 2021-12-09 DIAGNOSIS — Z992 Dependence on renal dialysis: Secondary | ICD-10-CM | POA: Diagnosis not present

## 2021-12-09 DIAGNOSIS — D689 Coagulation defect, unspecified: Secondary | ICD-10-CM | POA: Diagnosis not present

## 2021-12-09 DIAGNOSIS — N2581 Secondary hyperparathyroidism of renal origin: Secondary | ICD-10-CM | POA: Diagnosis not present

## 2021-12-09 DIAGNOSIS — E669 Obesity, unspecified: Secondary | ICD-10-CM

## 2021-12-09 DIAGNOSIS — R197 Diarrhea, unspecified: Secondary | ICD-10-CM | POA: Diagnosis not present

## 2021-12-09 DIAGNOSIS — R519 Headache, unspecified: Secondary | ICD-10-CM | POA: Diagnosis not present

## 2021-12-09 MED ORDER — DEXCOM G7 RECEIVER DEVI: 0 refills | Status: DC

## 2021-12-09 MED ORDER — DEXCOM G7 SENSOR MISC: 3 refills | Status: DC

## 2021-12-09 NOTE — Telephone Encounter (Signed)
[employer] calls asking for  a Medicare detailed written order needs to be signed and returned. Call to patient who would prefer to get them at [facility]. She agreed to have prescriptions for the [facility] G7 sensors and reader sent to [facility]. She'll call when she gets the to schedule an appointment to learn how to use it.  ?

## 2021-12-11 DIAGNOSIS — L299 Pruritus, unspecified: Secondary | ICD-10-CM | POA: Diagnosis not present

## 2021-12-11 DIAGNOSIS — N2581 Secondary hyperparathyroidism of renal origin: Secondary | ICD-10-CM | POA: Diagnosis not present

## 2021-12-11 DIAGNOSIS — D689 Coagulation defect, unspecified: Secondary | ICD-10-CM | POA: Diagnosis not present

## 2021-12-11 DIAGNOSIS — Z992 Dependence on renal dialysis: Secondary | ICD-10-CM | POA: Diagnosis not present

## 2021-12-11 DIAGNOSIS — R197 Diarrhea, unspecified: Secondary | ICD-10-CM | POA: Diagnosis not present

## 2021-12-11 DIAGNOSIS — R519 Headache, unspecified: Secondary | ICD-10-CM | POA: Diagnosis not present

## 2021-12-11 DIAGNOSIS — N186 End stage renal disease: Secondary | ICD-10-CM | POA: Diagnosis not present

## 2021-12-13 DIAGNOSIS — R519 Headache, unspecified: Secondary | ICD-10-CM | POA: Diagnosis not present

## 2021-12-13 DIAGNOSIS — Z992 Dependence on renal dialysis: Secondary | ICD-10-CM | POA: Diagnosis not present

## 2021-12-13 DIAGNOSIS — N186 End stage renal disease: Secondary | ICD-10-CM | POA: Diagnosis not present

## 2021-12-13 DIAGNOSIS — D689 Coagulation defect, unspecified: Secondary | ICD-10-CM | POA: Diagnosis not present

## 2021-12-13 DIAGNOSIS — L299 Pruritus, unspecified: Secondary | ICD-10-CM | POA: Diagnosis not present

## 2021-12-13 DIAGNOSIS — R197 Diarrhea, unspecified: Secondary | ICD-10-CM | POA: Diagnosis not present

## 2021-12-13 DIAGNOSIS — N2581 Secondary hyperparathyroidism of renal origin: Secondary | ICD-10-CM | POA: Diagnosis not present

## 2021-12-16 DIAGNOSIS — N2581 Secondary hyperparathyroidism of renal origin: Secondary | ICD-10-CM | POA: Diagnosis not present

## 2021-12-16 DIAGNOSIS — R519 Headache, unspecified: Secondary | ICD-10-CM | POA: Diagnosis not present

## 2021-12-16 DIAGNOSIS — D689 Coagulation defect, unspecified: Secondary | ICD-10-CM | POA: Diagnosis not present

## 2021-12-16 DIAGNOSIS — R197 Diarrhea, unspecified: Secondary | ICD-10-CM | POA: Diagnosis not present

## 2021-12-16 DIAGNOSIS — L299 Pruritus, unspecified: Secondary | ICD-10-CM | POA: Diagnosis not present

## 2021-12-16 DIAGNOSIS — Z992 Dependence on renal dialysis: Secondary | ICD-10-CM | POA: Diagnosis not present

## 2021-12-16 DIAGNOSIS — N186 End stage renal disease: Secondary | ICD-10-CM | POA: Diagnosis not present

## 2021-12-18 DIAGNOSIS — N186 End stage renal disease: Secondary | ICD-10-CM | POA: Diagnosis not present

## 2021-12-18 DIAGNOSIS — Z992 Dependence on renal dialysis: Secondary | ICD-10-CM | POA: Diagnosis not present

## 2021-12-18 DIAGNOSIS — R197 Diarrhea, unspecified: Secondary | ICD-10-CM | POA: Diagnosis not present

## 2021-12-18 DIAGNOSIS — L299 Pruritus, unspecified: Secondary | ICD-10-CM | POA: Diagnosis not present

## 2021-12-18 DIAGNOSIS — D689 Coagulation defect, unspecified: Secondary | ICD-10-CM | POA: Diagnosis not present

## 2021-12-18 DIAGNOSIS — N2581 Secondary hyperparathyroidism of renal origin: Secondary | ICD-10-CM | POA: Diagnosis not present

## 2021-12-18 DIAGNOSIS — R519 Headache, unspecified: Secondary | ICD-10-CM | POA: Diagnosis not present

## 2021-12-20 DIAGNOSIS — N2581 Secondary hyperparathyroidism of renal origin: Secondary | ICD-10-CM | POA: Diagnosis not present

## 2021-12-20 DIAGNOSIS — Z992 Dependence on renal dialysis: Secondary | ICD-10-CM | POA: Diagnosis not present

## 2021-12-20 DIAGNOSIS — N186 End stage renal disease: Secondary | ICD-10-CM | POA: Diagnosis not present

## 2021-12-20 DIAGNOSIS — R197 Diarrhea, unspecified: Secondary | ICD-10-CM | POA: Diagnosis not present

## 2021-12-20 DIAGNOSIS — R519 Headache, unspecified: Secondary | ICD-10-CM | POA: Diagnosis not present

## 2021-12-20 DIAGNOSIS — D689 Coagulation defect, unspecified: Secondary | ICD-10-CM | POA: Diagnosis not present

## 2021-12-20 DIAGNOSIS — L299 Pruritus, unspecified: Secondary | ICD-10-CM | POA: Diagnosis not present

## 2021-12-23 ENCOUNTER — Other Ambulatory Visit (HOSPITAL_COMMUNITY): Payer: Self-pay

## 2021-12-23 DIAGNOSIS — N186 End stage renal disease: Secondary | ICD-10-CM | POA: Diagnosis not present

## 2021-12-23 DIAGNOSIS — R197 Diarrhea, unspecified: Secondary | ICD-10-CM | POA: Diagnosis not present

## 2021-12-23 DIAGNOSIS — N2581 Secondary hyperparathyroidism of renal origin: Secondary | ICD-10-CM | POA: Diagnosis not present

## 2021-12-23 DIAGNOSIS — Z992 Dependence on renal dialysis: Secondary | ICD-10-CM | POA: Diagnosis not present

## 2021-12-23 DIAGNOSIS — D689 Coagulation defect, unspecified: Secondary | ICD-10-CM | POA: Diagnosis not present

## 2021-12-23 DIAGNOSIS — R519 Headache, unspecified: Secondary | ICD-10-CM | POA: Diagnosis not present

## 2021-12-23 DIAGNOSIS — L299 Pruritus, unspecified: Secondary | ICD-10-CM | POA: Diagnosis not present

## 2021-12-25 DIAGNOSIS — N2581 Secondary hyperparathyroidism of renal origin: Secondary | ICD-10-CM | POA: Diagnosis not present

## 2021-12-25 DIAGNOSIS — Z992 Dependence on renal dialysis: Secondary | ICD-10-CM | POA: Diagnosis not present

## 2021-12-25 DIAGNOSIS — D689 Coagulation defect, unspecified: Secondary | ICD-10-CM | POA: Diagnosis not present

## 2021-12-25 DIAGNOSIS — N186 End stage renal disease: Secondary | ICD-10-CM | POA: Diagnosis not present

## 2021-12-25 DIAGNOSIS — L299 Pruritus, unspecified: Secondary | ICD-10-CM | POA: Diagnosis not present

## 2021-12-25 DIAGNOSIS — R519 Headache, unspecified: Secondary | ICD-10-CM | POA: Diagnosis not present

## 2021-12-25 DIAGNOSIS — R197 Diarrhea, unspecified: Secondary | ICD-10-CM | POA: Diagnosis not present

## 2021-12-25 NOTE — Telephone Encounter (Signed)
Call to pharmacy, Dexcom G7 Continuous glucose monitoring rx from 5/2 is waiting on a PA. Confirmed correct fax number. They ar faxing it again ( also to cover my meds)

## 2021-12-26 ENCOUNTER — Other Ambulatory Visit (HOSPITAL_COMMUNITY): Payer: Self-pay

## 2021-12-27 DIAGNOSIS — N186 End stage renal disease: Secondary | ICD-10-CM | POA: Diagnosis not present

## 2021-12-27 DIAGNOSIS — R519 Headache, unspecified: Secondary | ICD-10-CM | POA: Diagnosis not present

## 2021-12-27 DIAGNOSIS — Z992 Dependence on renal dialysis: Secondary | ICD-10-CM | POA: Diagnosis not present

## 2021-12-27 DIAGNOSIS — R197 Diarrhea, unspecified: Secondary | ICD-10-CM | POA: Diagnosis not present

## 2021-12-27 DIAGNOSIS — D689 Coagulation defect, unspecified: Secondary | ICD-10-CM | POA: Diagnosis not present

## 2021-12-27 DIAGNOSIS — L299 Pruritus, unspecified: Secondary | ICD-10-CM | POA: Diagnosis not present

## 2021-12-27 DIAGNOSIS — N2581 Secondary hyperparathyroidism of renal origin: Secondary | ICD-10-CM | POA: Diagnosis not present

## 2021-12-30 DIAGNOSIS — R197 Diarrhea, unspecified: Secondary | ICD-10-CM | POA: Diagnosis not present

## 2021-12-30 DIAGNOSIS — L299 Pruritus, unspecified: Secondary | ICD-10-CM | POA: Diagnosis not present

## 2021-12-30 DIAGNOSIS — D689 Coagulation defect, unspecified: Secondary | ICD-10-CM | POA: Diagnosis not present

## 2021-12-30 DIAGNOSIS — R519 Headache, unspecified: Secondary | ICD-10-CM | POA: Diagnosis not present

## 2021-12-30 DIAGNOSIS — N2581 Secondary hyperparathyroidism of renal origin: Secondary | ICD-10-CM | POA: Diagnosis not present

## 2021-12-30 DIAGNOSIS — N186 End stage renal disease: Secondary | ICD-10-CM | POA: Diagnosis not present

## 2021-12-30 DIAGNOSIS — Z992 Dependence on renal dialysis: Secondary | ICD-10-CM | POA: Diagnosis not present

## 2021-12-31 ENCOUNTER — Telehealth: Payer: Self-pay

## 2021-12-31 NOTE — Telephone Encounter (Signed)
PA for pt ( [place] )  came through on cover my meds was submitted with office notes and last lab ... awaiting approval or denial..      UPDATE :   [employer] is reviewing your PA request. Typically an electronic response will be received within 24-72 hours.

## 2022-01-01 DIAGNOSIS — N2581 Secondary hyperparathyroidism of renal origin: Secondary | ICD-10-CM | POA: Diagnosis not present

## 2022-01-01 DIAGNOSIS — D689 Coagulation defect, unspecified: Secondary | ICD-10-CM | POA: Diagnosis not present

## 2022-01-01 DIAGNOSIS — R519 Headache, unspecified: Secondary | ICD-10-CM | POA: Diagnosis not present

## 2022-01-01 DIAGNOSIS — L299 Pruritus, unspecified: Secondary | ICD-10-CM | POA: Diagnosis not present

## 2022-01-01 DIAGNOSIS — Z992 Dependence on renal dialysis: Secondary | ICD-10-CM | POA: Diagnosis not present

## 2022-01-01 DIAGNOSIS — R197 Diarrhea, unspecified: Secondary | ICD-10-CM | POA: Diagnosis not present

## 2022-01-01 DIAGNOSIS — N186 End stage renal disease: Secondary | ICD-10-CM | POA: Diagnosis not present

## 2022-01-01 NOTE — Telephone Encounter (Signed)
DECISION :     Approved today  Request Reference Number: [id number].    [place] is approved  through 08/09/2022.    Your patient may now fill this prescription and it will be covered.   Drug Dexcom G7 Sensor   Form [employer] Medicare Part D Electronic Prior Authorization Form (2017 NCPDP)     ( COPY SENT TO PHARMACY ALSO )

## 2022-01-03 DIAGNOSIS — N186 End stage renal disease: Secondary | ICD-10-CM | POA: Diagnosis not present

## 2022-01-03 DIAGNOSIS — Z992 Dependence on renal dialysis: Secondary | ICD-10-CM | POA: Diagnosis not present

## 2022-01-03 DIAGNOSIS — N2581 Secondary hyperparathyroidism of renal origin: Secondary | ICD-10-CM | POA: Diagnosis not present

## 2022-01-03 DIAGNOSIS — D689 Coagulation defect, unspecified: Secondary | ICD-10-CM | POA: Diagnosis not present

## 2022-01-03 DIAGNOSIS — L299 Pruritus, unspecified: Secondary | ICD-10-CM | POA: Diagnosis not present

## 2022-01-03 DIAGNOSIS — R519 Headache, unspecified: Secondary | ICD-10-CM | POA: Diagnosis not present

## 2022-01-03 DIAGNOSIS — R197 Diarrhea, unspecified: Secondary | ICD-10-CM | POA: Diagnosis not present

## 2022-01-06 ENCOUNTER — Telehealth: Payer: Self-pay | Admitting: *Deleted

## 2022-01-06 DIAGNOSIS — N186 End stage renal disease: Secondary | ICD-10-CM | POA: Diagnosis not present

## 2022-01-06 DIAGNOSIS — R519 Headache, unspecified: Secondary | ICD-10-CM | POA: Diagnosis not present

## 2022-01-06 DIAGNOSIS — N2581 Secondary hyperparathyroidism of renal origin: Secondary | ICD-10-CM | POA: Diagnosis not present

## 2022-01-06 DIAGNOSIS — R197 Diarrhea, unspecified: Secondary | ICD-10-CM | POA: Diagnosis not present

## 2022-01-06 DIAGNOSIS — L299 Pruritus, unspecified: Secondary | ICD-10-CM | POA: Diagnosis not present

## 2022-01-06 DIAGNOSIS — D689 Coagulation defect, unspecified: Secondary | ICD-10-CM | POA: Diagnosis not present

## 2022-01-06 DIAGNOSIS — Z992 Dependence on renal dialysis: Secondary | ICD-10-CM | POA: Diagnosis not present

## 2022-01-06 NOTE — Telephone Encounter (Addendum)
Tried calling Kathy Frank to let her know her Dexcom, G7 CGM sensors were approved. No answer.unable to leave a message

## 2022-01-06 NOTE — Telephone Encounter (Signed)
Called patient unable to leave voice message  regarding her up coming appointment 01-07-2022 @  1:15pm

## 2022-01-07 ENCOUNTER — Encounter: Payer: Medicare Other | Admitting: Student

## 2022-01-07 DIAGNOSIS — E1129 Type 2 diabetes mellitus with other diabetic kidney complication: Secondary | ICD-10-CM | POA: Diagnosis not present

## 2022-01-07 DIAGNOSIS — N186 End stage renal disease: Secondary | ICD-10-CM | POA: Diagnosis not present

## 2022-01-07 DIAGNOSIS — Z992 Dependence on renal dialysis: Secondary | ICD-10-CM | POA: Diagnosis not present

## 2022-01-10 DIAGNOSIS — N186 End stage renal disease: Secondary | ICD-10-CM | POA: Diagnosis not present

## 2022-01-10 DIAGNOSIS — R519 Headache, unspecified: Secondary | ICD-10-CM | POA: Diagnosis not present

## 2022-01-10 DIAGNOSIS — Z992 Dependence on renal dialysis: Secondary | ICD-10-CM | POA: Diagnosis not present

## 2022-01-10 DIAGNOSIS — R197 Diarrhea, unspecified: Secondary | ICD-10-CM | POA: Diagnosis not present

## 2022-01-10 DIAGNOSIS — L299 Pruritus, unspecified: Secondary | ICD-10-CM | POA: Diagnosis not present

## 2022-01-10 DIAGNOSIS — D689 Coagulation defect, unspecified: Secondary | ICD-10-CM | POA: Diagnosis not present

## 2022-01-10 DIAGNOSIS — N2581 Secondary hyperparathyroidism of renal origin: Secondary | ICD-10-CM | POA: Diagnosis not present

## 2022-01-13 DIAGNOSIS — D689 Coagulation defect, unspecified: Secondary | ICD-10-CM | POA: Diagnosis not present

## 2022-01-13 DIAGNOSIS — L299 Pruritus, unspecified: Secondary | ICD-10-CM | POA: Diagnosis not present

## 2022-01-13 DIAGNOSIS — R519 Headache, unspecified: Secondary | ICD-10-CM | POA: Diagnosis not present

## 2022-01-13 DIAGNOSIS — N2581 Secondary hyperparathyroidism of renal origin: Secondary | ICD-10-CM | POA: Diagnosis not present

## 2022-01-13 DIAGNOSIS — R197 Diarrhea, unspecified: Secondary | ICD-10-CM | POA: Diagnosis not present

## 2022-01-13 DIAGNOSIS — N186 End stage renal disease: Secondary | ICD-10-CM | POA: Diagnosis not present

## 2022-01-13 DIAGNOSIS — Z992 Dependence on renal dialysis: Secondary | ICD-10-CM | POA: Diagnosis not present

## 2022-01-15 DIAGNOSIS — Z992 Dependence on renal dialysis: Secondary | ICD-10-CM | POA: Diagnosis not present

## 2022-01-15 DIAGNOSIS — R519 Headache, unspecified: Secondary | ICD-10-CM | POA: Diagnosis not present

## 2022-01-15 DIAGNOSIS — L299 Pruritus, unspecified: Secondary | ICD-10-CM | POA: Diagnosis not present

## 2022-01-15 DIAGNOSIS — N2581 Secondary hyperparathyroidism of renal origin: Secondary | ICD-10-CM | POA: Diagnosis not present

## 2022-01-15 DIAGNOSIS — R197 Diarrhea, unspecified: Secondary | ICD-10-CM | POA: Diagnosis not present

## 2022-01-15 DIAGNOSIS — N186 End stage renal disease: Secondary | ICD-10-CM | POA: Diagnosis not present

## 2022-01-15 DIAGNOSIS — D689 Coagulation defect, unspecified: Secondary | ICD-10-CM | POA: Diagnosis not present

## 2022-01-20 ENCOUNTER — Other Ambulatory Visit (HOSPITAL_COMMUNITY): Payer: Self-pay

## 2022-01-20 DIAGNOSIS — Z992 Dependence on renal dialysis: Secondary | ICD-10-CM | POA: Diagnosis not present

## 2022-01-20 DIAGNOSIS — D689 Coagulation defect, unspecified: Secondary | ICD-10-CM | POA: Diagnosis not present

## 2022-01-20 DIAGNOSIS — R519 Headache, unspecified: Secondary | ICD-10-CM | POA: Diagnosis not present

## 2022-01-20 DIAGNOSIS — L299 Pruritus, unspecified: Secondary | ICD-10-CM | POA: Diagnosis not present

## 2022-01-20 DIAGNOSIS — R197 Diarrhea, unspecified: Secondary | ICD-10-CM | POA: Diagnosis not present

## 2022-01-20 DIAGNOSIS — N2581 Secondary hyperparathyroidism of renal origin: Secondary | ICD-10-CM | POA: Diagnosis not present

## 2022-01-20 DIAGNOSIS — N186 End stage renal disease: Secondary | ICD-10-CM | POA: Diagnosis not present

## 2022-01-22 DIAGNOSIS — D689 Coagulation defect, unspecified: Secondary | ICD-10-CM | POA: Diagnosis not present

## 2022-01-22 DIAGNOSIS — L299 Pruritus, unspecified: Secondary | ICD-10-CM | POA: Diagnosis not present

## 2022-01-22 DIAGNOSIS — R519 Headache, unspecified: Secondary | ICD-10-CM | POA: Diagnosis not present

## 2022-01-22 DIAGNOSIS — N2581 Secondary hyperparathyroidism of renal origin: Secondary | ICD-10-CM | POA: Diagnosis not present

## 2022-01-22 DIAGNOSIS — Z992 Dependence on renal dialysis: Secondary | ICD-10-CM | POA: Diagnosis not present

## 2022-01-22 DIAGNOSIS — N186 End stage renal disease: Secondary | ICD-10-CM | POA: Diagnosis not present

## 2022-01-22 DIAGNOSIS — R197 Diarrhea, unspecified: Secondary | ICD-10-CM | POA: Diagnosis not present

## 2022-01-23 ENCOUNTER — Other Ambulatory Visit (HOSPITAL_COMMUNITY): Payer: Self-pay

## 2022-01-24 DIAGNOSIS — Z992 Dependence on renal dialysis: Secondary | ICD-10-CM | POA: Diagnosis not present

## 2022-01-24 DIAGNOSIS — L299 Pruritus, unspecified: Secondary | ICD-10-CM | POA: Diagnosis not present

## 2022-01-24 DIAGNOSIS — N2581 Secondary hyperparathyroidism of renal origin: Secondary | ICD-10-CM | POA: Diagnosis not present

## 2022-01-24 DIAGNOSIS — R519 Headache, unspecified: Secondary | ICD-10-CM | POA: Diagnosis not present

## 2022-01-24 DIAGNOSIS — R197 Diarrhea, unspecified: Secondary | ICD-10-CM | POA: Diagnosis not present

## 2022-01-24 DIAGNOSIS — D689 Coagulation defect, unspecified: Secondary | ICD-10-CM | POA: Diagnosis not present

## 2022-01-24 DIAGNOSIS — N186 End stage renal disease: Secondary | ICD-10-CM | POA: Diagnosis not present

## 2022-01-27 DIAGNOSIS — D689 Coagulation defect, unspecified: Secondary | ICD-10-CM | POA: Diagnosis not present

## 2022-01-27 DIAGNOSIS — N186 End stage renal disease: Secondary | ICD-10-CM | POA: Diagnosis not present

## 2022-01-27 DIAGNOSIS — R519 Headache, unspecified: Secondary | ICD-10-CM | POA: Diagnosis not present

## 2022-01-27 DIAGNOSIS — R197 Diarrhea, unspecified: Secondary | ICD-10-CM | POA: Diagnosis not present

## 2022-01-27 DIAGNOSIS — Z992 Dependence on renal dialysis: Secondary | ICD-10-CM | POA: Diagnosis not present

## 2022-01-27 DIAGNOSIS — L299 Pruritus, unspecified: Secondary | ICD-10-CM | POA: Diagnosis not present

## 2022-01-27 DIAGNOSIS — N2581 Secondary hyperparathyroidism of renal origin: Secondary | ICD-10-CM | POA: Diagnosis not present

## 2022-01-28 ENCOUNTER — Other Ambulatory Visit (HOSPITAL_COMMUNITY): Payer: Self-pay

## 2022-01-29 DIAGNOSIS — D689 Coagulation defect, unspecified: Secondary | ICD-10-CM | POA: Diagnosis not present

## 2022-01-29 DIAGNOSIS — N186 End stage renal disease: Secondary | ICD-10-CM | POA: Diagnosis not present

## 2022-01-29 DIAGNOSIS — N2581 Secondary hyperparathyroidism of renal origin: Secondary | ICD-10-CM | POA: Diagnosis not present

## 2022-01-29 DIAGNOSIS — Z992 Dependence on renal dialysis: Secondary | ICD-10-CM | POA: Diagnosis not present

## 2022-01-29 DIAGNOSIS — R197 Diarrhea, unspecified: Secondary | ICD-10-CM | POA: Diagnosis not present

## 2022-01-29 DIAGNOSIS — R519 Headache, unspecified: Secondary | ICD-10-CM | POA: Diagnosis not present

## 2022-01-29 DIAGNOSIS — L299 Pruritus, unspecified: Secondary | ICD-10-CM | POA: Diagnosis not present

## 2022-01-31 DIAGNOSIS — Z992 Dependence on renal dialysis: Secondary | ICD-10-CM | POA: Diagnosis not present

## 2022-01-31 DIAGNOSIS — N2581 Secondary hyperparathyroidism of renal origin: Secondary | ICD-10-CM | POA: Diagnosis not present

## 2022-01-31 DIAGNOSIS — L299 Pruritus, unspecified: Secondary | ICD-10-CM | POA: Diagnosis not present

## 2022-01-31 DIAGNOSIS — R197 Diarrhea, unspecified: Secondary | ICD-10-CM | POA: Diagnosis not present

## 2022-01-31 DIAGNOSIS — D689 Coagulation defect, unspecified: Secondary | ICD-10-CM | POA: Diagnosis not present

## 2022-01-31 DIAGNOSIS — R519 Headache, unspecified: Secondary | ICD-10-CM | POA: Diagnosis not present

## 2022-01-31 DIAGNOSIS — N186 End stage renal disease: Secondary | ICD-10-CM | POA: Diagnosis not present

## 2022-02-03 DIAGNOSIS — L299 Pruritus, unspecified: Secondary | ICD-10-CM | POA: Diagnosis not present

## 2022-02-03 DIAGNOSIS — R197 Diarrhea, unspecified: Secondary | ICD-10-CM | POA: Diagnosis not present

## 2022-02-03 DIAGNOSIS — R519 Headache, unspecified: Secondary | ICD-10-CM | POA: Diagnosis not present

## 2022-02-03 DIAGNOSIS — N2581 Secondary hyperparathyroidism of renal origin: Secondary | ICD-10-CM | POA: Diagnosis not present

## 2022-02-03 DIAGNOSIS — Z992 Dependence on renal dialysis: Secondary | ICD-10-CM | POA: Diagnosis not present

## 2022-02-03 DIAGNOSIS — N186 End stage renal disease: Secondary | ICD-10-CM | POA: Diagnosis not present

## 2022-02-03 DIAGNOSIS — D689 Coagulation defect, unspecified: Secondary | ICD-10-CM | POA: Diagnosis not present

## 2022-02-05 DIAGNOSIS — L299 Pruritus, unspecified: Secondary | ICD-10-CM | POA: Diagnosis not present

## 2022-02-05 DIAGNOSIS — R197 Diarrhea, unspecified: Secondary | ICD-10-CM | POA: Diagnosis not present

## 2022-02-05 DIAGNOSIS — R519 Headache, unspecified: Secondary | ICD-10-CM | POA: Diagnosis not present

## 2022-02-05 DIAGNOSIS — D689 Coagulation defect, unspecified: Secondary | ICD-10-CM | POA: Diagnosis not present

## 2022-02-05 DIAGNOSIS — N186 End stage renal disease: Secondary | ICD-10-CM | POA: Diagnosis not present

## 2022-02-05 DIAGNOSIS — N2581 Secondary hyperparathyroidism of renal origin: Secondary | ICD-10-CM | POA: Diagnosis not present

## 2022-02-05 DIAGNOSIS — Z992 Dependence on renal dialysis: Secondary | ICD-10-CM | POA: Diagnosis not present

## 2022-02-06 ENCOUNTER — Other Ambulatory Visit: Payer: Self-pay | Admitting: Vascular Surgery

## 2022-02-06 ENCOUNTER — Other Ambulatory Visit: Payer: Self-pay

## 2022-02-06 DIAGNOSIS — E1129 Type 2 diabetes mellitus with other diabetic kidney complication: Secondary | ICD-10-CM | POA: Diagnosis not present

## 2022-02-06 DIAGNOSIS — Z992 Dependence on renal dialysis: Secondary | ICD-10-CM | POA: Diagnosis not present

## 2022-02-06 DIAGNOSIS — N186 End stage renal disease: Secondary | ICD-10-CM | POA: Diagnosis not present

## 2022-02-06 MED ORDER — LIRAGLUTIDE 18 MG/3ML ~~LOC~~ SOPN: 0.6000 mg | PEN_INJECTOR | Freq: Every day | SUBCUTANEOUS | 3 refills | Status: DC

## 2022-02-06 MED ORDER — LANTUS SOLOSTAR 100 UNIT/ML ~~LOC~~ SOPN: 8.0000 [IU] | PEN_INJECTOR | Freq: Every day | SUBCUTANEOUS | 11 refills | Status: DC

## 2022-02-06 NOTE — Telephone Encounter (Signed)
Victoza last sent for 37mL with no refills on 05/02/21  Plavix and ASA were both sent today to requested pharmacy by outside provider.

## 2022-02-06 NOTE — Telephone Encounter (Signed)
liraglutide (VICTOZA) 18 MG/3ML SOPN (Expired)  insulin glargine (LANTUS SOLOSTAR) 100 UNIT/ML Solostar Pen  clopidogrel (PLAVIX) 75 MG tablet  aspirin EC 81 MG tablet, REFILL REQUEST @ [place] ([place]), [place] - [address]

## 2022-02-07 ENCOUNTER — Encounter (HOSPITAL_COMMUNITY): Payer: Self-pay

## 2022-02-07 DIAGNOSIS — N186 End stage renal disease: Secondary | ICD-10-CM | POA: Diagnosis not present

## 2022-02-07 DIAGNOSIS — N2581 Secondary hyperparathyroidism of renal origin: Secondary | ICD-10-CM | POA: Diagnosis not present

## 2022-02-07 DIAGNOSIS — L299 Pruritus, unspecified: Secondary | ICD-10-CM | POA: Diagnosis not present

## 2022-02-07 DIAGNOSIS — Z992 Dependence on renal dialysis: Secondary | ICD-10-CM | POA: Diagnosis not present

## 2022-02-07 DIAGNOSIS — E1110 Type 2 diabetes mellitus with ketoacidosis without coma: Secondary | ICD-10-CM | POA: Diagnosis not present

## 2022-02-07 DIAGNOSIS — R519 Headache, unspecified: Secondary | ICD-10-CM | POA: Diagnosis not present

## 2022-02-07 DIAGNOSIS — D689 Coagulation defect, unspecified: Secondary | ICD-10-CM | POA: Diagnosis not present

## 2022-02-07 DIAGNOSIS — R197 Diarrhea, unspecified: Secondary | ICD-10-CM | POA: Diagnosis not present

## 2022-02-07 DIAGNOSIS — D631 Anemia in chronic kidney disease: Secondary | ICD-10-CM | POA: Diagnosis not present

## 2022-02-10 DIAGNOSIS — Z992 Dependence on renal dialysis: Secondary | ICD-10-CM | POA: Diagnosis not present

## 2022-02-10 DIAGNOSIS — D631 Anemia in chronic kidney disease: Secondary | ICD-10-CM | POA: Diagnosis not present

## 2022-02-10 DIAGNOSIS — N186 End stage renal disease: Secondary | ICD-10-CM | POA: Diagnosis not present

## 2022-02-10 DIAGNOSIS — R197 Diarrhea, unspecified: Secondary | ICD-10-CM | POA: Diagnosis not present

## 2022-02-10 DIAGNOSIS — D689 Coagulation defect, unspecified: Secondary | ICD-10-CM | POA: Diagnosis not present

## 2022-02-10 DIAGNOSIS — N2581 Secondary hyperparathyroidism of renal origin: Secondary | ICD-10-CM | POA: Diagnosis not present

## 2022-02-10 DIAGNOSIS — E1110 Type 2 diabetes mellitus with ketoacidosis without coma: Secondary | ICD-10-CM | POA: Diagnosis not present

## 2022-02-10 DIAGNOSIS — R519 Headache, unspecified: Secondary | ICD-10-CM | POA: Diagnosis not present

## 2022-02-10 DIAGNOSIS — L299 Pruritus, unspecified: Secondary | ICD-10-CM | POA: Diagnosis not present

## 2022-02-11 ENCOUNTER — Ambulatory Visit: Payer: Medicare Other | Admitting: Internal Medicine

## 2022-02-12 ENCOUNTER — Encounter (HOSPITAL_COMMUNITY): Payer: Self-pay

## 2022-02-12 DIAGNOSIS — E1110 Type 2 diabetes mellitus with ketoacidosis without coma: Secondary | ICD-10-CM | POA: Diagnosis not present

## 2022-02-12 DIAGNOSIS — L299 Pruritus, unspecified: Secondary | ICD-10-CM | POA: Diagnosis not present

## 2022-02-12 DIAGNOSIS — N186 End stage renal disease: Secondary | ICD-10-CM | POA: Diagnosis not present

## 2022-02-12 DIAGNOSIS — D631 Anemia in chronic kidney disease: Secondary | ICD-10-CM | POA: Diagnosis not present

## 2022-02-12 DIAGNOSIS — R519 Headache, unspecified: Secondary | ICD-10-CM | POA: Diagnosis not present

## 2022-02-12 DIAGNOSIS — Z992 Dependence on renal dialysis: Secondary | ICD-10-CM | POA: Diagnosis not present

## 2022-02-12 DIAGNOSIS — R197 Diarrhea, unspecified: Secondary | ICD-10-CM | POA: Diagnosis not present

## 2022-02-12 DIAGNOSIS — D689 Coagulation defect, unspecified: Secondary | ICD-10-CM | POA: Diagnosis not present

## 2022-02-12 DIAGNOSIS — N2581 Secondary hyperparathyroidism of renal origin: Secondary | ICD-10-CM | POA: Diagnosis not present

## 2022-02-17 DIAGNOSIS — E1110 Type 2 diabetes mellitus with ketoacidosis without coma: Secondary | ICD-10-CM | POA: Diagnosis not present

## 2022-02-17 DIAGNOSIS — N186 End stage renal disease: Secondary | ICD-10-CM | POA: Diagnosis not present

## 2022-02-17 DIAGNOSIS — N2581 Secondary hyperparathyroidism of renal origin: Secondary | ICD-10-CM | POA: Diagnosis not present

## 2022-02-17 DIAGNOSIS — R519 Headache, unspecified: Secondary | ICD-10-CM | POA: Diagnosis not present

## 2022-02-17 DIAGNOSIS — D689 Coagulation defect, unspecified: Secondary | ICD-10-CM | POA: Diagnosis not present

## 2022-02-17 DIAGNOSIS — Z992 Dependence on renal dialysis: Secondary | ICD-10-CM | POA: Diagnosis not present

## 2022-02-17 DIAGNOSIS — R197 Diarrhea, unspecified: Secondary | ICD-10-CM | POA: Diagnosis not present

## 2022-02-17 DIAGNOSIS — D631 Anemia in chronic kidney disease: Secondary | ICD-10-CM | POA: Diagnosis not present

## 2022-02-17 DIAGNOSIS — L299 Pruritus, unspecified: Secondary | ICD-10-CM | POA: Diagnosis not present

## 2022-02-18 ENCOUNTER — Other Ambulatory Visit: Payer: Self-pay | Admitting: Internal Medicine

## 2022-02-18 ENCOUNTER — Other Ambulatory Visit (HOSPITAL_COMMUNITY): Payer: Self-pay

## 2022-02-18 DIAGNOSIS — B20 Human immunodeficiency virus [HIV] disease: Secondary | ICD-10-CM

## 2022-02-18 MED ORDER — BIKTARVY 50-200-25 MG PO TABS
1.0000 | ORAL_TABLET | Freq: Every day | ORAL | 1 refills | Status: DC
  Filled 2022-02-18: qty 30, 30d supply, fill #0
  Filled 2022-03-16: qty 30, 30d supply, fill #1

## 2022-02-19 DIAGNOSIS — N186 End stage renal disease: Secondary | ICD-10-CM | POA: Diagnosis not present

## 2022-02-19 DIAGNOSIS — R519 Headache, unspecified: Secondary | ICD-10-CM | POA: Diagnosis not present

## 2022-02-19 DIAGNOSIS — R197 Diarrhea, unspecified: Secondary | ICD-10-CM | POA: Diagnosis not present

## 2022-02-19 DIAGNOSIS — N2581 Secondary hyperparathyroidism of renal origin: Secondary | ICD-10-CM | POA: Diagnosis not present

## 2022-02-19 DIAGNOSIS — D689 Coagulation defect, unspecified: Secondary | ICD-10-CM | POA: Diagnosis not present

## 2022-02-19 DIAGNOSIS — L299 Pruritus, unspecified: Secondary | ICD-10-CM | POA: Diagnosis not present

## 2022-02-19 DIAGNOSIS — E1110 Type 2 diabetes mellitus with ketoacidosis without coma: Secondary | ICD-10-CM | POA: Diagnosis not present

## 2022-02-19 DIAGNOSIS — Z992 Dependence on renal dialysis: Secondary | ICD-10-CM | POA: Diagnosis not present

## 2022-02-19 DIAGNOSIS — D631 Anemia in chronic kidney disease: Secondary | ICD-10-CM | POA: Diagnosis not present

## 2022-02-20 ENCOUNTER — Other Ambulatory Visit (HOSPITAL_COMMUNITY): Payer: Self-pay

## 2022-02-21 DIAGNOSIS — R197 Diarrhea, unspecified: Secondary | ICD-10-CM | POA: Diagnosis not present

## 2022-02-21 DIAGNOSIS — N186 End stage renal disease: Secondary | ICD-10-CM | POA: Diagnosis not present

## 2022-02-21 DIAGNOSIS — D631 Anemia in chronic kidney disease: Secondary | ICD-10-CM | POA: Diagnosis not present

## 2022-02-21 DIAGNOSIS — R519 Headache, unspecified: Secondary | ICD-10-CM | POA: Diagnosis not present

## 2022-02-21 DIAGNOSIS — Z992 Dependence on renal dialysis: Secondary | ICD-10-CM | POA: Diagnosis not present

## 2022-02-21 DIAGNOSIS — L299 Pruritus, unspecified: Secondary | ICD-10-CM | POA: Diagnosis not present

## 2022-02-21 DIAGNOSIS — N2581 Secondary hyperparathyroidism of renal origin: Secondary | ICD-10-CM | POA: Diagnosis not present

## 2022-02-21 DIAGNOSIS — E1110 Type 2 diabetes mellitus with ketoacidosis without coma: Secondary | ICD-10-CM | POA: Diagnosis not present

## 2022-02-21 DIAGNOSIS — D689 Coagulation defect, unspecified: Secondary | ICD-10-CM | POA: Diagnosis not present

## 2022-02-24 DIAGNOSIS — R197 Diarrhea, unspecified: Secondary | ICD-10-CM | POA: Diagnosis not present

## 2022-02-24 DIAGNOSIS — E1110 Type 2 diabetes mellitus with ketoacidosis without coma: Secondary | ICD-10-CM | POA: Diagnosis not present

## 2022-02-24 DIAGNOSIS — N2581 Secondary hyperparathyroidism of renal origin: Secondary | ICD-10-CM | POA: Diagnosis not present

## 2022-02-24 DIAGNOSIS — N186 End stage renal disease: Secondary | ICD-10-CM | POA: Diagnosis not present

## 2022-02-24 DIAGNOSIS — L299 Pruritus, unspecified: Secondary | ICD-10-CM | POA: Diagnosis not present

## 2022-02-24 DIAGNOSIS — D689 Coagulation defect, unspecified: Secondary | ICD-10-CM | POA: Diagnosis not present

## 2022-02-24 DIAGNOSIS — Z992 Dependence on renal dialysis: Secondary | ICD-10-CM | POA: Diagnosis not present

## 2022-02-24 DIAGNOSIS — D631 Anemia in chronic kidney disease: Secondary | ICD-10-CM | POA: Diagnosis not present

## 2022-02-24 DIAGNOSIS — R519 Headache, unspecified: Secondary | ICD-10-CM | POA: Diagnosis not present

## 2022-02-25 ENCOUNTER — Other Ambulatory Visit: Payer: Self-pay | Admitting: *Deleted

## 2022-02-25 NOTE — Patient Outreach (Signed)
[name]) Care Management  02/25/2022  Kathy Frank 1975/11/22 [id number]  Successful telephone outreach call to patient for screening. HIPAA identifiers obtained. [facility] services reviewed and discussed with patient. Patient verbally agrees to participate in the [facility] program. Nurse explained that a Nurse CM will outreach to the patient within the next several weeks. Patient verbalized understanding.  Emelia Loron RN, BSN [place] [phone] [email or web address]

## 2022-02-26 DIAGNOSIS — Z992 Dependence on renal dialysis: Secondary | ICD-10-CM | POA: Diagnosis not present

## 2022-02-26 DIAGNOSIS — N2581 Secondary hyperparathyroidism of renal origin: Secondary | ICD-10-CM | POA: Diagnosis not present

## 2022-02-26 DIAGNOSIS — L299 Pruritus, unspecified: Secondary | ICD-10-CM | POA: Diagnosis not present

## 2022-02-26 DIAGNOSIS — E1110 Type 2 diabetes mellitus with ketoacidosis without coma: Secondary | ICD-10-CM | POA: Diagnosis not present

## 2022-02-26 DIAGNOSIS — D631 Anemia in chronic kidney disease: Secondary | ICD-10-CM | POA: Diagnosis not present

## 2022-02-26 DIAGNOSIS — N186 End stage renal disease: Secondary | ICD-10-CM | POA: Diagnosis not present

## 2022-02-26 DIAGNOSIS — R519 Headache, unspecified: Secondary | ICD-10-CM | POA: Diagnosis not present

## 2022-02-26 DIAGNOSIS — D689 Coagulation defect, unspecified: Secondary | ICD-10-CM | POA: Diagnosis not present

## 2022-02-26 DIAGNOSIS — R197 Diarrhea, unspecified: Secondary | ICD-10-CM | POA: Diagnosis not present

## 2022-02-28 DIAGNOSIS — R519 Headache, unspecified: Secondary | ICD-10-CM | POA: Diagnosis not present

## 2022-02-28 DIAGNOSIS — R197 Diarrhea, unspecified: Secondary | ICD-10-CM | POA: Diagnosis not present

## 2022-02-28 DIAGNOSIS — L299 Pruritus, unspecified: Secondary | ICD-10-CM | POA: Diagnosis not present

## 2022-02-28 DIAGNOSIS — E1110 Type 2 diabetes mellitus with ketoacidosis without coma: Secondary | ICD-10-CM | POA: Diagnosis not present

## 2022-02-28 DIAGNOSIS — N2581 Secondary hyperparathyroidism of renal origin: Secondary | ICD-10-CM | POA: Diagnosis not present

## 2022-02-28 DIAGNOSIS — D631 Anemia in chronic kidney disease: Secondary | ICD-10-CM | POA: Diagnosis not present

## 2022-02-28 DIAGNOSIS — Z992 Dependence on renal dialysis: Secondary | ICD-10-CM | POA: Diagnosis not present

## 2022-02-28 DIAGNOSIS — D689 Coagulation defect, unspecified: Secondary | ICD-10-CM | POA: Diagnosis not present

## 2022-02-28 DIAGNOSIS — N186 End stage renal disease: Secondary | ICD-10-CM | POA: Diagnosis not present

## 2022-03-01 ENCOUNTER — Other Ambulatory Visit: Payer: Self-pay | Admitting: Internal Medicine

## 2022-03-03 DIAGNOSIS — N2581 Secondary hyperparathyroidism of renal origin: Secondary | ICD-10-CM | POA: Diagnosis not present

## 2022-03-03 DIAGNOSIS — D689 Coagulation defect, unspecified: Secondary | ICD-10-CM | POA: Diagnosis not present

## 2022-03-03 DIAGNOSIS — R519 Headache, unspecified: Secondary | ICD-10-CM | POA: Diagnosis not present

## 2022-03-03 DIAGNOSIS — R197 Diarrhea, unspecified: Secondary | ICD-10-CM | POA: Diagnosis not present

## 2022-03-03 DIAGNOSIS — D631 Anemia in chronic kidney disease: Secondary | ICD-10-CM | POA: Diagnosis not present

## 2022-03-03 DIAGNOSIS — L299 Pruritus, unspecified: Secondary | ICD-10-CM | POA: Diagnosis not present

## 2022-03-03 DIAGNOSIS — E1110 Type 2 diabetes mellitus with ketoacidosis without coma: Secondary | ICD-10-CM | POA: Diagnosis not present

## 2022-03-03 DIAGNOSIS — Z992 Dependence on renal dialysis: Secondary | ICD-10-CM | POA: Diagnosis not present

## 2022-03-03 DIAGNOSIS — N186 End stage renal disease: Secondary | ICD-10-CM | POA: Diagnosis not present

## 2022-03-05 DIAGNOSIS — D689 Coagulation defect, unspecified: Secondary | ICD-10-CM | POA: Diagnosis not present

## 2022-03-05 DIAGNOSIS — R197 Diarrhea, unspecified: Secondary | ICD-10-CM | POA: Diagnosis not present

## 2022-03-05 DIAGNOSIS — L299 Pruritus, unspecified: Secondary | ICD-10-CM | POA: Diagnosis not present

## 2022-03-05 DIAGNOSIS — E1110 Type 2 diabetes mellitus with ketoacidosis without coma: Secondary | ICD-10-CM | POA: Diagnosis not present

## 2022-03-05 DIAGNOSIS — D631 Anemia in chronic kidney disease: Secondary | ICD-10-CM | POA: Diagnosis not present

## 2022-03-05 DIAGNOSIS — N2581 Secondary hyperparathyroidism of renal origin: Secondary | ICD-10-CM | POA: Diagnosis not present

## 2022-03-05 DIAGNOSIS — N186 End stage renal disease: Secondary | ICD-10-CM | POA: Diagnosis not present

## 2022-03-05 DIAGNOSIS — R519 Headache, unspecified: Secondary | ICD-10-CM | POA: Diagnosis not present

## 2022-03-05 DIAGNOSIS — Z992 Dependence on renal dialysis: Secondary | ICD-10-CM | POA: Diagnosis not present

## 2022-03-08 ENCOUNTER — Emergency Department (HOSPITAL_COMMUNITY)
Admission: EM | Admit: 2022-03-08 | Discharge: 2022-03-09 | Disposition: A | Discharge status: Home / Self Care | Payer: Medicare Other | Attending: Emergency Medicine | Admitting: Emergency Medicine

## 2022-03-08 DIAGNOSIS — Z794 Long term (current) use of insulin: Secondary | ICD-10-CM | POA: Diagnosis not present

## 2022-03-08 DIAGNOSIS — G44209 Tension-type headache, unspecified, not intractable: Secondary | ICD-10-CM

## 2022-03-08 DIAGNOSIS — Z79899 Other long term (current) drug therapy: Secondary | ICD-10-CM | POA: Insufficient documentation

## 2022-03-08 DIAGNOSIS — Z992 Dependence on renal dialysis: Secondary | ICD-10-CM | POA: Diagnosis not present

## 2022-03-08 DIAGNOSIS — R519 Headache, unspecified: Secondary | ICD-10-CM | POA: Diagnosis not present

## 2022-03-08 DIAGNOSIS — N9489 Other specified conditions associated with female genital organs and menstrual cycle: Secondary | ICD-10-CM | POA: Diagnosis not present

## 2022-03-08 DIAGNOSIS — I120 Hypertensive chronic kidney disease with stage 5 chronic kidney disease or end stage renal disease: Secondary | ICD-10-CM | POA: Diagnosis not present

## 2022-03-08 DIAGNOSIS — R0602 Shortness of breath: Secondary | ICD-10-CM | POA: Diagnosis not present

## 2022-03-08 DIAGNOSIS — N186 End stage renal disease: Secondary | ICD-10-CM | POA: Insufficient documentation

## 2022-03-08 DIAGNOSIS — I10 Essential (primary) hypertension: Secondary | ICD-10-CM

## 2022-03-08 DIAGNOSIS — Z7902 Long term (current) use of antithrombotics/antiplatelets: Secondary | ICD-10-CM | POA: Diagnosis not present

## 2022-03-09 ENCOUNTER — Emergency Department (HOSPITAL_COMMUNITY): Payer: Medicare Other

## 2022-03-09 DIAGNOSIS — Z992 Dependence on renal dialysis: Secondary | ICD-10-CM | POA: Diagnosis not present

## 2022-03-09 DIAGNOSIS — N186 End stage renal disease: Secondary | ICD-10-CM | POA: Diagnosis not present

## 2022-03-09 DIAGNOSIS — R519 Headache, unspecified: Secondary | ICD-10-CM | POA: Diagnosis not present

## 2022-03-09 DIAGNOSIS — E1129 Type 2 diabetes mellitus with other diabetic kidney complication: Secondary | ICD-10-CM | POA: Diagnosis not present

## 2022-03-09 DIAGNOSIS — R0602 Shortness of breath: Secondary | ICD-10-CM | POA: Diagnosis not present

## 2022-03-09 LAB — I-STAT BETA HCG BLOOD, ED (MC, WL, AP ONLY): I-stat hCG, quantitative: <5 m[IU]/mL (ref <5)

## 2022-03-09 LAB — CBC WITH DIFFERENTIAL/PLATELET
Abs Immature Granulocytes: 0.01 10*3/uL (ref 0.00–0.07)
Basophils Absolute: 0.1 10*3/uL (ref 0.0–0.1)
Basophils Relative: 2 %
Eosinophils Absolute: 0.2 10*3/uL (ref 0.0–0.5)
Eosinophils Relative: 5 %
HCT: 31.5 % — ABNORMAL LOW (ref 36.0–46.0)
Hemoglobin: 10.3 g/dL — ABNORMAL LOW (ref 12.0–15.0)
Immature Granulocytes: 0 %
Lymphocytes Relative: 36 %
Lymphs Abs: 1.7 10*3/uL (ref 0.7–4.0)
MCH: 31.1 pg (ref 26.0–34.0)
MCHC: 32.7 g/dL (ref 30.0–36.0)
MCV: 95.2 fL (ref 80.0–100.0)
Monocytes Absolute: 0.4 10*3/uL (ref 0.1–1.0)
Monocytes Relative: 9 %
Neutro Abs: 2.3 10*3/uL (ref 1.7–7.7)
Neutrophils Relative %: 48 %
Platelets: 269 10*3/uL (ref 150–400)
RBC: 3.31 MIL/uL — ABNORMAL LOW (ref 3.87–5.11)
RDW: 15.8 % — ABNORMAL HIGH (ref 11.5–15.5)
WBC: 4.7 10*3/uL (ref 4.0–10.5)
nRBC: 0 % (ref 0.0–0.2)

## 2022-03-09 LAB — COMPREHENSIVE METABOLIC PANEL
ALT: 8 U/L (ref 0–44)
AST: 10 U/L — ABNORMAL LOW (ref 15–41)
Albumin: 3.5 g/dL (ref 3.5–5.0)
Alkaline Phosphatase: 44 U/L (ref 38–126)
Anion gap: 17 — ABNORMAL HIGH (ref 5–15)
BUN: 66 mg/dL — ABNORMAL HIGH (ref 6–20)
CO2: 20 mmol/L — ABNORMAL LOW (ref 22–32)
Calcium: 9 mg/dL (ref 8.9–10.3)
Chloride: 104 mmol/L (ref 98–111)
Creatinine, Ser: 12.67 mg/dL — ABNORMAL HIGH (ref 0.44–1.00)
GFR, Estimated: 3 mL/min — ABNORMAL LOW (ref >60)
Glucose, Bld: 189 mg/dL — ABNORMAL HIGH (ref 70–99)
Potassium: 4.3 mmol/L (ref 3.5–5.1)
Sodium: 141 mmol/L (ref 135–145)
Total Bilirubin: 0.5 mg/dL (ref 0.3–1.2)
Total Protein: 7.6 g/dL (ref 6.5–8.1)

## 2022-03-09 LAB — TROPONIN I (HIGH SENSITIVITY)
Troponin I (High Sensitivity): 64 ng/L — ABNORMAL HIGH (ref <18)
Troponin I (High Sensitivity): 50 ng/L — ABNORMAL HIGH (ref <18)

## 2022-03-09 MED ORDER — PROCHLORPERAZINE EDISYLATE 10 MG/2ML IJ SOLN
10.0000 mg | Freq: Once | INTRAMUSCULAR | Status: AC
  Administered 2022-03-09: 10 mg via INTRAVENOUS
  Filled 2022-03-09: qty 2

## 2022-03-09 MED ORDER — ACETAMINOPHEN 500 MG PO TABS
1000.0000 mg | ORAL_TABLET | Freq: Once | ORAL | Status: AC
  Administered 2022-03-09: 1000 mg via ORAL
  Filled 2022-03-09: qty 2

## 2022-03-09 MED ORDER — LACTATED RINGERS IV BOLUS
250.0000 mL | Freq: Once | INTRAVENOUS | Status: AC
Start: 2022-03-09 — End: 2022-03-09
  Administered 2022-03-09: 250 mL via INTRAVENOUS

## 2022-03-09 MED ORDER — LABETALOL HCL 5 MG/ML IV SOLN
20.0000 mg | Freq: Once | INTRAVENOUS | Status: AC
Start: 2022-03-09 — End: 2022-03-09
  Administered 2022-03-09: 20 mg via INTRAVENOUS
  Filled 2022-03-09: qty 4

## 2022-03-09 NOTE — ED Triage Notes (Signed)
Pt c/o SHOB since today, worse when lying flat, HA since yesterday. Denies numbness, weakness, states it "just hurts."

## 2022-03-09 NOTE — Discharge Instructions (Addendum)
Please follow-up with your regular scheduled dialysis appointment tomorrow.

## 2022-03-09 NOTE — ED Provider Notes (Signed)
[facility] EMERGENCY DEPARTMENT Provider Note   CSN: [id number] Arrival date & time: 03/08/22  2328     History  Chief Complaint  Patient presents with   Shortness of Breath    Kathy Frank is a 46 y.o. female.   Shortness of Breath Associated symptoms: headaches     46 year old female with past medical history significant for PAD status post BKA on the right and AKA on the left, ESRD on HD T TH S who presents to the emergency department with a chief complaint of headache and hypertension.  The patient states that she missed her dialysis session on Saturday.  She has been getting headaches after dialysis.  She states that she had some shortness of breath and difficulty lying flat earlier today in the setting of missed dialysis.  She complains of some mild chest tightness.  She states that yesterday she had a gradual onset frontal headache described as located in a bandlike location across her forehead.  She denies any neck pain or stiffness.  She denies any fever or chills.  She denies any vision loss, double vision, numbness or weakness which is new.  Home Medications Prior to Admission medications   Medication Sig Start Date End Date Taking? Authorizing Provider  Accu-Chek Softclix Lancets lancets Use as instructed 05/02/21   Atway, Rayann N, DO  albuterol (VENTOLIN HFA) 108 (90 Base) MCG/ACT inhaler Inhale 2 puffs into the lungs every 4 (four) hours as needed for wheezing or shortness of breath. 02/14/21   Angiulli, Lavon Paganini, PA-C  amLODipine (NORVASC) 10 MG tablet Take 1 tablet (10 mg total) by mouth daily. 05/15/21   [name], MD  atorvastatin (LIPITOR) 80 MG tablet Take 1 tablet (80 mg total) by mouth daily. 05/24/21   Masters, Katie, DO  bictegravir-emtricitabine-tenofovir AF (BIKTARVY) 50-200-25 MG TABS tablet TAKE 1 TABLET BY MOUTH DAILY. 02/18/22 03/22/22  Michel Bickers, MD  calcitRIOL (ROCALTROL) 0.25 MCG capsule Take 1 capsule (0.25 mcg total) by  mouth Every Tuesday,Thursday,and Saturday with dialysis. 04/01/21   Ulyses Amor, PA-C  calcium acetate (PHOSLO) 667 MG capsule Take 3 capsules (2,001 mg total) by mouth 3 (three) times daily with meals. 02/14/21   Angiulli, Lavon Paganini, PA-C  clopidogrel (PLAVIX) 75 MG tablet Take 1 tablet by mouth once daily 02/06/22   Waynetta Sandy, MD  Continuous Blood Gluc Receiver (DEXCOM G7 RECEIVER) DEVI Use to check blood sugars cntinuously 12/09/21   [name], MD  Continuous Blood Gluc Sensor ([place]) MISC Use to check blood sugar continuously 12/09/21   [name], MD  EQ ASPIRIN ADULT LOW DOSE 81 MG tablet TAKE 1 TABLET BY MOUTH ONCE DAILY. SWALLOW WHOLE. 02/06/22   Waynetta Sandy, MD  glucose blood test strip Use as instructed 05/02/21   Atway, Rayann N, DO  hydrALAZINE (APRESOLINE) 50 MG tablet Take 1 tablet by mouth 4 times daily 03/02/22   [name], MD  insulin glargine (LANTUS SOLOSTAR) 100 UNIT/ML Solostar Pen Inject 8 Units into the skin at bedtime. 02/06/22 04/05/28  [name], MD  Insulin Pen Needle (PENTIPS) 32G X 4 MM MISC Use as directed 02/14/21   Meredith Staggers, MD  liraglutide (VICTOZA) 18 MG/3ML SOPN Inject 0.6 mg into the skin daily. 02/06/22 02/01/23  [name], MD  loperamide (IMODIUM A-D) 2 MG tablet Take 2 mg by mouth 4 (four) times daily as needed for diarrhea or loose stools.    [provider]  multivitamin (RENA-VIT)  TABS tablet Take 1 tablet by mouth daily. Patient taking differently: Take 1 tablet by mouth See admin instructions. On Tuesday, Wednesday and Thursday 02/14/21   Angiulli, Lavon Paganini, PA-C  polyethylene glycol (MIRALAX / GLYCOLAX) 17 g packet Take 17 g by mouth 2 (two) times daily. Patient taking differently: Take 17 g by mouth daily as needed for mild constipation or moderate constipation. 02/14/21   Angiulli, Lavon Paganini, PA-C  propranolol (INDERAL) 20 MG tablet Take 1 tablet (20 mg total) by mouth every 8 (eight) hours.  09/03/21   Tobb, Kardie, DO  sertraline (ZOLOFT) 50 MG tablet Take 1 tablet (50 mg total) by mouth at bedtime. Patient not taking: Reported on 09/05/2021 02/14/21   Cathlyn Parsons, PA-C      Allergies    Patient has no known allergies.    Review of Systems   Review of Systems  Respiratory:  Positive for chest tightness and shortness of breath.   Neurological:  Positive for headaches.  All other systems reviewed and are negative.   Physical Exam Updated Vital Signs BP (!) 202/93 (BP Location: Right Arm)   Pulse 79   Temp 97.6 F (36.4 C) (Oral)   Resp 14   SpO2 99%  Physical Exam Vitals and nursing note reviewed.  Constitutional:      General: She is not in acute distress.    Appearance: She is well-developed.  HENT:     Head: Normocephalic and atraumatic.  Eyes:     Conjunctiva/sclera: Conjunctivae normal.  Neck:     Vascular: No JVD.  Cardiovascular:     Rate and Rhythm: Normal rate and regular rhythm.     Heart sounds: No murmur heard. Pulmonary:     Effort: Pulmonary effort is normal. No respiratory distress.     Breath sounds: Normal breath sounds. No decreased breath sounds, wheezing, rhonchi or rales.  Abdominal:     Palpations: Abdomen is soft.     Tenderness: There is no abdominal tenderness.  Musculoskeletal:        General: No swelling.     Cervical back: Neck supple.  Skin:    General: Skin is warm and dry.     Capillary Refill: Capillary refill takes less than 2 seconds.  Neurological:     General: No focal deficit present.     Mental Status: She is alert and oriented to person, place, and time.     Cranial Nerves: No cranial nerve deficit.     Motor: No weakness.     Comments: MENTAL STATUS EXAM:    Orientation: Alert and oriented to person, place and time.  Memory: Cooperative, follows commands well.  Language: Speech is clear and language is normal.   CRANIAL NERVES:    CN 2 (Optic): Visual fields intact to confrontation.  CN 3,4,6 (EOM):  Pupils equal and reactive to light. Full extraocular eye movement without nystagmus.  CN 5 (Trigeminal): Facial sensation is normal, no weakness of masticatory muscles.  CN 7 (Facial): No facial weakness or asymmetry.  CN 8 (Auditory): Auditory acuity grossly normal.  CN 9,10 (Glossophar): The uvula is midline, the palate elevates symmetrically.  CN 11 (spinal access): Normal sternocleidomastoid and trapezius strength.  CN 12 (Hypoglossal): The tongue is midline. No atrophy or fasciculations.Marland Kitchen   MOTOR:  Muscle Strength: 5/5RUE, 5/5LUE, 5/5RLE, 5/5LLE.   COORDINATION:   Intact finger-to-nose, no tremor.   SENSATION:   Intact to light touch all four extremities.    Psychiatric:  Mood and Affect: Mood normal.     ED Results / Procedures / Treatments   Labs (all labs ordered are listed, but only abnormal results are displayed) Labs Reviewed  COMPREHENSIVE METABOLIC PANEL - Abnormal; Notable for the following components:      Result Value   CO2 20 (*)    Glucose, Bld 189 (*)    BUN 66 (*)    Creatinine, Ser 12.67 (*)    AST 10 (*)    GFR, Estimated 3 (*)    Anion gap 17 (*)    All other components within normal limits  CBC WITH DIFFERENTIAL/PLATELET - Abnormal; Notable for the following components:   RBC 3.31 (*)    Hemoglobin 10.3 (*)    HCT 31.5 (*)    RDW 15.8 (*)    All other components within normal limits  TROPONIN I (HIGH SENSITIVITY) - Abnormal; Notable for the following components:   Troponin I (High Sensitivity) 64 (*)    All other components within normal limits  TROPONIN I (HIGH SENSITIVITY) - Abnormal; Notable for the following components:   Troponin I (High Sensitivity) 50 (*)    All other components within normal limits  I-STAT BETA HCG BLOOD, ED ([facility], [facility], [facility] ONLY)  TROPONIN I (HIGH SENSITIVITY)    EKG EKG Interpretation  Date/Time:  Monday March 09 2022 00:39:21 EDT Ventricular Rate:  88 PR Interval:  172 QRS Duration: 86 QT Interval:  398 QTC  Calculation: 481 R Axis:   29 Text Interpretation: Normal sinus rhythm Possible Left atrial enlargement Low voltage QRS Cannot rule out Anterior infarct , age undetermined Abnormal ECG When compared with ECG of 09-Nov-2021 13:04, PREVIOUS ECG IS PRESENT No significant change since last tracing Confirmed by Regan Lemming ([id number]) on 03/09/2022 10:16:01 AM  Radiology CT Head Wo Contrast  Result Date: 03/09/2022 CLINICAL DATA:  New onset headaches EXAM: CT HEAD WITHOUT CONTRAST TECHNIQUE: Contiguous axial images were obtained from the base of the skull through the vertex without intravenous contrast. RADIATION DOSE REDUCTION: This exam was performed according to the departmental dose-optimization program which includes automated exposure control, adjustment of the mA and/or kV according to patient size and/or use of iterative reconstruction technique. COMPARISON:  11/09/2021 FINDINGS: Brain: No evidence of acute infarction, hemorrhage, hydrocephalus, extra-axial collection or mass lesion/mass effect. Stable hypodensity in the deep white matter in the right frontal lobe is noted. This extends inferiorly adjacent to the head of the caudate nucleus on the right also stable from the prior study. Vascular: No hyperdense vessel or unexpected calcification. Skull: Normal. Negative for fracture or focal lesion. Sinuses/Orbits: No acute finding. Other: None. IMPRESSION: Stable changes of prior ischemia on the right. No new focal abnormality is noted. Electronically Signed   By: Inez Catalina M.D.   On: 03/09/2022 01:39   DG Chest 2 View  Result Date: 03/09/2022 CLINICAL DATA:  Shortness of breath. EXAM: CHEST - 2 VIEW COMPARISON:  Chest radiograph dated 11/09/2021. FINDINGS: No focal consolidation, pleural effusion, pneumothorax. Mild cardiomegaly. Coronary vascular calcification. Atherosclerotic calcification of the aorta. No acute osseous pathology. IMPRESSION: 1. No acute cardiopulmonary process. 2. Mild cardiomegaly.  Electronically Signed   By: Anner Crete M.D.   On: 03/09/2022 01:22    Procedures Procedures    Medications Ordered in ED Medications  labetalol (NORMODYNE) injection 20 mg (20 mg Intravenous Given 03/09/22 1021)  acetaminophen (TYLENOL) tablet 1,000 mg (1,000 mg Oral Given 03/09/22 1022)  lactated ringers bolus 250 mL (0 mLs Intravenous Stopped  03/09/22 1126)  prochlorperazine (COMPAZINE) injection 10 mg (10 mg Intravenous Given 03/09/22 1022)    ED Course/ Medical Decision Making/ A&P                           Medical Decision Making Risk OTC drugs. Prescription drug management.    46 year old female with past medical history significant for PAD status post BKA on the right and AKA on the left, ESRD on HD T TH S who presents to the emergency department with a chief complaint of headache and hypertension.  The patient states that she missed her dialysis session on Saturday.  She has been getting headaches after dialysis.  She states that she had some shortness of breath and difficulty lying flat earlier today in the setting of missed dialysis.  She complains of some mild chest tightness.  She states that yesterday she had a gradual onset frontal headache described as located in a bandlike location across her forehead.  She denies any neck pain or stiffness.  She denies any fever or chills.  She denies any vision loss, double vision, numbness or weakness which is new.  On arrival, the patient was afebrile, not tachycardic or tachypneic, saturating well on room air.  The patient presents hypertensive BP 214/96.  She denies any vision complaints.  She has a normal neurologic exam.  Frontal diagnosis includes hypertensive emergency, tension type headache, migraine headache, less likely intracranial hemorrhage, less likely pulmonary edema, hypervolemia in the setting of missed dialysis, considered CHF exacerbation.  An EKG was performed which revealed no ischemic changes as per above.  Chest  x-ray revealed no evidence of pulmonary edema and the patient has no JVD and has lungs that are clear to auscultation in all 4 quadrants.  Low concern for acute pulmonary edema at this time.  Patient laboratory work-up significant for CBC without a leukocytosis, anemia  of ESRD present hemoglobin at baseline at 10.3.  The patient's troponins were elevated but downtrending at 64 and subsequently 50.  The patient CMP revealed elevated BUN and creatinine to 66 and 12.67 but a normal potassium at 4.3.  Patient has a mild anion gap acidosis with a bicarb of 20 and an anion gap of 17.  On review the patient's chest x-ray, no focal consolidation to suggest bacterial pneumonia.  Cardiomegaly was present but no evidence of pulmonary edema.  A CT head was performed revealed an old stroke but no acute intracranial abnormality.  Low concern for ACS with a patient without chest pain and downtrending troponin in a patient with ESRD.  IV access was obtained and the patient was administered a small IV fluid bolus of 250 cc, Tylenol 1 g, Compazine 10 mg and labetalol 20 mg for her elevated blood pressure.  On repeat assessment, the patient's blood pressure had improved to 180/51.  She on reassessment states that her symptoms had completely resolved.  She has no evidence of acute pulmonary edema, is not hypoxic, not tachypneic, has clear lungs on auscultation on exam.  Overall at this time feel that the patient is stable for outpatient follow-up with dialysis tomorrow.  Patient's headache appears to be a tension type headache.  Low concern for subarachnoid hemorrhage, intracranial hemorrhage with no other acute abnormalities identified on CT head.  Do not feel further imaging or testing is warranted at this time.  On reassessment, the patient was neuro intact, vitally stable, stable for outpatient management.  Return precautions provided.  Final Clinical Impression(s) / ED Diagnoses Final diagnoses:  Hypertension,  unspecified type  Acute non intractable tension-type headache  ESRD (end stage renal disease) ([place])    Rx / DC Orders ED Discharge Orders     None         Regan Lemming, MD 03/09/22 1146

## 2022-03-09 NOTE — ED Provider Triage Note (Signed)
Emergency Medicine Provider Triage Evaluation Note  Kathy Frank , a 46 y.o. female  was evaluated in triage.  Pt complains of dyspnea that started earlier today, constant, worse when trying to lay flat, no alleviating factors. Associated chest tightness. Also reports headache since yesterday, frontal, feels like prior migraines.T/Th/Sa dialysis- missed Saturday, last dialyzed Thursday, called to get on the schedule tomorrow. Denies loss of vision, diplopia, numbness, or weakness  Review of Systems  Per above  Physical Exam  BP (!) 214/96 (BP Location: Right Arm)   Pulse 87   Temp 98.5 F (36.9 C) (Oral)   Resp 18   SpO2 99%  Gen:   Awake, no distress   Resp:  Normal effort  MSK:   Right BKA, left AKA  Other:  No facial droop. Strength & sensation grossly intact to [place].   Medical Decision Making  Medically screening exam initiated at 12:46 AM.  Appropriate orders placed.  Kathy Frank was informed that the remainder of the evaluation will be completed by another provider, this initial triage assessment does not replace that evaluation, and the importance of remaining in the ED until their evaluation is complete.  Repeat BP 191/97- remains hypertensive.   Dyspnea, headache, HTN   Kathy Dyke, PA-C 03/09/22 0143

## 2022-03-10 DIAGNOSIS — E1129 Type 2 diabetes mellitus with other diabetic kidney complication: Secondary | ICD-10-CM | POA: Diagnosis not present

## 2022-03-10 DIAGNOSIS — R197 Diarrhea, unspecified: Secondary | ICD-10-CM | POA: Diagnosis not present

## 2022-03-10 DIAGNOSIS — N2581 Secondary hyperparathyroidism of renal origin: Secondary | ICD-10-CM | POA: Diagnosis not present

## 2022-03-10 DIAGNOSIS — R519 Headache, unspecified: Secondary | ICD-10-CM | POA: Diagnosis not present

## 2022-03-10 DIAGNOSIS — N186 End stage renal disease: Secondary | ICD-10-CM | POA: Diagnosis not present

## 2022-03-10 DIAGNOSIS — Z992 Dependence on renal dialysis: Secondary | ICD-10-CM | POA: Diagnosis not present

## 2022-03-10 DIAGNOSIS — D631 Anemia in chronic kidney disease: Secondary | ICD-10-CM | POA: Diagnosis not present

## 2022-03-10 DIAGNOSIS — L299 Pruritus, unspecified: Secondary | ICD-10-CM | POA: Diagnosis not present

## 2022-03-12 ENCOUNTER — Telehealth: Payer: Self-pay | Admitting: *Deleted

## 2022-03-12 DIAGNOSIS — E1129 Type 2 diabetes mellitus with other diabetic kidney complication: Secondary | ICD-10-CM | POA: Diagnosis not present

## 2022-03-12 DIAGNOSIS — L299 Pruritus, unspecified: Secondary | ICD-10-CM | POA: Diagnosis not present

## 2022-03-12 DIAGNOSIS — D631 Anemia in chronic kidney disease: Secondary | ICD-10-CM | POA: Diagnosis not present

## 2022-03-12 DIAGNOSIS — N186 End stage renal disease: Secondary | ICD-10-CM | POA: Diagnosis not present

## 2022-03-12 DIAGNOSIS — R519 Headache, unspecified: Secondary | ICD-10-CM | POA: Diagnosis not present

## 2022-03-12 DIAGNOSIS — R197 Diarrhea, unspecified: Secondary | ICD-10-CM | POA: Diagnosis not present

## 2022-03-12 DIAGNOSIS — N2581 Secondary hyperparathyroidism of renal origin: Secondary | ICD-10-CM | POA: Diagnosis not present

## 2022-03-12 DIAGNOSIS — Z992 Dependence on renal dialysis: Secondary | ICD-10-CM | POA: Diagnosis not present

## 2022-03-12 NOTE — Patient Outreach (Signed)
  Care Coordination   Initial Visit Note   03/12/2022 Name: Kathy Frank MRN: [id number] DOB: 1976/04/30  Kathy Frank is a 46 y.o. year old female who sees Kathy Beard, MD for primary care. I spoke with  Kathy Frank by phone today  What matters to the patients health and wellness today?  declines    Goals Addressed   None     SDOH assessments and interventions completed:  No     Care Coordination Interventions Activated:  No  Care Coordination Interventions:  No, not indicated   Follow up plan: No further intervention required.   Encounter Outcome:  Pt. Refused    Kathy Frank MSW, LCSW Licensed Clinical Social Worker      [phone]

## 2022-03-12 NOTE — Telephone Encounter (Signed)
     Patient  visit on 03/09/2022  at [facility] ed  was for headache and short of breathe   Have you been able to follow up with your primary care physician? Yes he changed my blood pressure med's   The patient was able to obtain any needed medicine or equipment.  Are there diet recommendations that you are having difficulty following? N/A  Patient expresses understanding of discharge instructions and education provided has no other needs at this time.   [place] [phone] [address] , [place] [address] Email : [email or web address]

## 2022-03-14 DIAGNOSIS — R519 Headache, unspecified: Secondary | ICD-10-CM | POA: Diagnosis not present

## 2022-03-14 DIAGNOSIS — D631 Anemia in chronic kidney disease: Secondary | ICD-10-CM | POA: Diagnosis not present

## 2022-03-14 DIAGNOSIS — N2581 Secondary hyperparathyroidism of renal origin: Secondary | ICD-10-CM | POA: Diagnosis not present

## 2022-03-14 DIAGNOSIS — N186 End stage renal disease: Secondary | ICD-10-CM | POA: Diagnosis not present

## 2022-03-14 DIAGNOSIS — Z992 Dependence on renal dialysis: Secondary | ICD-10-CM | POA: Diagnosis not present

## 2022-03-14 DIAGNOSIS — L299 Pruritus, unspecified: Secondary | ICD-10-CM | POA: Diagnosis not present

## 2022-03-14 DIAGNOSIS — E1129 Type 2 diabetes mellitus with other diabetic kidney complication: Secondary | ICD-10-CM | POA: Diagnosis not present

## 2022-03-14 DIAGNOSIS — R197 Diarrhea, unspecified: Secondary | ICD-10-CM | POA: Diagnosis not present

## 2022-03-16 ENCOUNTER — Other Ambulatory Visit (HOSPITAL_COMMUNITY): Payer: Self-pay

## 2022-03-17 DIAGNOSIS — R519 Headache, unspecified: Secondary | ICD-10-CM | POA: Diagnosis not present

## 2022-03-17 DIAGNOSIS — Z992 Dependence on renal dialysis: Secondary | ICD-10-CM | POA: Diagnosis not present

## 2022-03-17 DIAGNOSIS — R197 Diarrhea, unspecified: Secondary | ICD-10-CM | POA: Diagnosis not present

## 2022-03-17 DIAGNOSIS — D631 Anemia in chronic kidney disease: Secondary | ICD-10-CM | POA: Diagnosis not present

## 2022-03-17 DIAGNOSIS — N186 End stage renal disease: Secondary | ICD-10-CM | POA: Diagnosis not present

## 2022-03-17 DIAGNOSIS — N2581 Secondary hyperparathyroidism of renal origin: Secondary | ICD-10-CM | POA: Diagnosis not present

## 2022-03-17 DIAGNOSIS — L299 Pruritus, unspecified: Secondary | ICD-10-CM | POA: Diagnosis not present

## 2022-03-17 DIAGNOSIS — E1129 Type 2 diabetes mellitus with other diabetic kidney complication: Secondary | ICD-10-CM | POA: Diagnosis not present

## 2022-03-19 ENCOUNTER — Other Ambulatory Visit (HOSPITAL_COMMUNITY): Payer: Self-pay

## 2022-03-19 DIAGNOSIS — R197 Diarrhea, unspecified: Secondary | ICD-10-CM | POA: Diagnosis not present

## 2022-03-19 DIAGNOSIS — Z992 Dependence on renal dialysis: Secondary | ICD-10-CM | POA: Diagnosis not present

## 2022-03-19 DIAGNOSIS — E1129 Type 2 diabetes mellitus with other diabetic kidney complication: Secondary | ICD-10-CM | POA: Diagnosis not present

## 2022-03-19 DIAGNOSIS — D631 Anemia in chronic kidney disease: Secondary | ICD-10-CM | POA: Diagnosis not present

## 2022-03-19 DIAGNOSIS — N2581 Secondary hyperparathyroidism of renal origin: Secondary | ICD-10-CM | POA: Diagnosis not present

## 2022-03-19 DIAGNOSIS — R519 Headache, unspecified: Secondary | ICD-10-CM | POA: Diagnosis not present

## 2022-03-19 DIAGNOSIS — L299 Pruritus, unspecified: Secondary | ICD-10-CM | POA: Diagnosis not present

## 2022-03-19 DIAGNOSIS — N186 End stage renal disease: Secondary | ICD-10-CM | POA: Diagnosis not present

## 2022-03-21 DIAGNOSIS — Z992 Dependence on renal dialysis: Secondary | ICD-10-CM | POA: Diagnosis not present

## 2022-03-21 DIAGNOSIS — N2581 Secondary hyperparathyroidism of renal origin: Secondary | ICD-10-CM | POA: Diagnosis not present

## 2022-03-21 DIAGNOSIS — N186 End stage renal disease: Secondary | ICD-10-CM | POA: Diagnosis not present

## 2022-03-21 DIAGNOSIS — D631 Anemia in chronic kidney disease: Secondary | ICD-10-CM | POA: Diagnosis not present

## 2022-03-21 DIAGNOSIS — L299 Pruritus, unspecified: Secondary | ICD-10-CM | POA: Diagnosis not present

## 2022-03-21 DIAGNOSIS — E1129 Type 2 diabetes mellitus with other diabetic kidney complication: Secondary | ICD-10-CM | POA: Diagnosis not present

## 2022-03-21 DIAGNOSIS — R519 Headache, unspecified: Secondary | ICD-10-CM | POA: Diagnosis not present

## 2022-03-21 DIAGNOSIS — R197 Diarrhea, unspecified: Secondary | ICD-10-CM | POA: Diagnosis not present

## 2022-03-25 ENCOUNTER — Encounter: Payer: Medicare Other | Admitting: Student

## 2022-03-26 DIAGNOSIS — Z992 Dependence on renal dialysis: Secondary | ICD-10-CM | POA: Diagnosis not present

## 2022-03-26 DIAGNOSIS — R519 Headache, unspecified: Secondary | ICD-10-CM | POA: Diagnosis not present

## 2022-03-26 DIAGNOSIS — L299 Pruritus, unspecified: Secondary | ICD-10-CM | POA: Diagnosis not present

## 2022-03-26 DIAGNOSIS — N186 End stage renal disease: Secondary | ICD-10-CM | POA: Diagnosis not present

## 2022-03-26 DIAGNOSIS — N2581 Secondary hyperparathyroidism of renal origin: Secondary | ICD-10-CM | POA: Diagnosis not present

## 2022-03-26 DIAGNOSIS — R197 Diarrhea, unspecified: Secondary | ICD-10-CM | POA: Diagnosis not present

## 2022-03-26 DIAGNOSIS — E1129 Type 2 diabetes mellitus with other diabetic kidney complication: Secondary | ICD-10-CM | POA: Diagnosis not present

## 2022-03-26 DIAGNOSIS — D631 Anemia in chronic kidney disease: Secondary | ICD-10-CM | POA: Diagnosis not present

## 2022-03-27 DIAGNOSIS — Z89511 Acquired absence of right leg below knee: Secondary | ICD-10-CM | POA: Diagnosis not present

## 2022-03-28 DIAGNOSIS — R519 Headache, unspecified: Secondary | ICD-10-CM | POA: Diagnosis not present

## 2022-03-28 DIAGNOSIS — D631 Anemia in chronic kidney disease: Secondary | ICD-10-CM | POA: Diagnosis not present

## 2022-03-28 DIAGNOSIS — E1129 Type 2 diabetes mellitus with other diabetic kidney complication: Secondary | ICD-10-CM | POA: Diagnosis not present

## 2022-03-28 DIAGNOSIS — L299 Pruritus, unspecified: Secondary | ICD-10-CM | POA: Diagnosis not present

## 2022-03-28 DIAGNOSIS — R197 Diarrhea, unspecified: Secondary | ICD-10-CM | POA: Diagnosis not present

## 2022-03-28 DIAGNOSIS — Z992 Dependence on renal dialysis: Secondary | ICD-10-CM | POA: Diagnosis not present

## 2022-03-28 DIAGNOSIS — N2581 Secondary hyperparathyroidism of renal origin: Secondary | ICD-10-CM | POA: Diagnosis not present

## 2022-03-28 DIAGNOSIS — N186 End stage renal disease: Secondary | ICD-10-CM | POA: Diagnosis not present

## 2022-03-31 DIAGNOSIS — D631 Anemia in chronic kidney disease: Secondary | ICD-10-CM | POA: Diagnosis not present

## 2022-03-31 DIAGNOSIS — L299 Pruritus, unspecified: Secondary | ICD-10-CM | POA: Diagnosis not present

## 2022-03-31 DIAGNOSIS — N186 End stage renal disease: Secondary | ICD-10-CM | POA: Diagnosis not present

## 2022-03-31 DIAGNOSIS — Z992 Dependence on renal dialysis: Secondary | ICD-10-CM | POA: Diagnosis not present

## 2022-03-31 DIAGNOSIS — R197 Diarrhea, unspecified: Secondary | ICD-10-CM | POA: Diagnosis not present

## 2022-03-31 DIAGNOSIS — E1129 Type 2 diabetes mellitus with other diabetic kidney complication: Secondary | ICD-10-CM | POA: Diagnosis not present

## 2022-03-31 DIAGNOSIS — R519 Headache, unspecified: Secondary | ICD-10-CM | POA: Diagnosis not present

## 2022-03-31 DIAGNOSIS — N2581 Secondary hyperparathyroidism of renal origin: Secondary | ICD-10-CM | POA: Diagnosis not present

## 2022-04-02 DIAGNOSIS — E1129 Type 2 diabetes mellitus with other diabetic kidney complication: Secondary | ICD-10-CM | POA: Diagnosis not present

## 2022-04-02 DIAGNOSIS — N2581 Secondary hyperparathyroidism of renal origin: Secondary | ICD-10-CM | POA: Diagnosis not present

## 2022-04-02 DIAGNOSIS — Z992 Dependence on renal dialysis: Secondary | ICD-10-CM | POA: Diagnosis not present

## 2022-04-02 DIAGNOSIS — R519 Headache, unspecified: Secondary | ICD-10-CM | POA: Diagnosis not present

## 2022-04-02 DIAGNOSIS — L299 Pruritus, unspecified: Secondary | ICD-10-CM | POA: Diagnosis not present

## 2022-04-02 DIAGNOSIS — D631 Anemia in chronic kidney disease: Secondary | ICD-10-CM | POA: Diagnosis not present

## 2022-04-02 DIAGNOSIS — N186 End stage renal disease: Secondary | ICD-10-CM | POA: Diagnosis not present

## 2022-04-02 DIAGNOSIS — R197 Diarrhea, unspecified: Secondary | ICD-10-CM | POA: Diagnosis not present

## 2022-04-04 DIAGNOSIS — N186 End stage renal disease: Secondary | ICD-10-CM | POA: Diagnosis not present

## 2022-04-04 DIAGNOSIS — R197 Diarrhea, unspecified: Secondary | ICD-10-CM | POA: Diagnosis not present

## 2022-04-04 DIAGNOSIS — N2581 Secondary hyperparathyroidism of renal origin: Secondary | ICD-10-CM | POA: Diagnosis not present

## 2022-04-04 DIAGNOSIS — E1129 Type 2 diabetes mellitus with other diabetic kidney complication: Secondary | ICD-10-CM | POA: Diagnosis not present

## 2022-04-04 DIAGNOSIS — Z992 Dependence on renal dialysis: Secondary | ICD-10-CM | POA: Diagnosis not present

## 2022-04-04 DIAGNOSIS — D631 Anemia in chronic kidney disease: Secondary | ICD-10-CM | POA: Diagnosis not present

## 2022-04-04 DIAGNOSIS — L299 Pruritus, unspecified: Secondary | ICD-10-CM | POA: Diagnosis not present

## 2022-04-04 DIAGNOSIS — R519 Headache, unspecified: Secondary | ICD-10-CM | POA: Diagnosis not present

## 2022-04-07 ENCOUNTER — Telehealth: Payer: Self-pay

## 2022-04-07 DIAGNOSIS — D631 Anemia in chronic kidney disease: Secondary | ICD-10-CM | POA: Diagnosis not present

## 2022-04-07 DIAGNOSIS — R519 Headache, unspecified: Secondary | ICD-10-CM | POA: Diagnosis not present

## 2022-04-07 DIAGNOSIS — R197 Diarrhea, unspecified: Secondary | ICD-10-CM | POA: Diagnosis not present

## 2022-04-07 DIAGNOSIS — E1129 Type 2 diabetes mellitus with other diabetic kidney complication: Secondary | ICD-10-CM | POA: Diagnosis not present

## 2022-04-07 DIAGNOSIS — N186 End stage renal disease: Secondary | ICD-10-CM | POA: Diagnosis not present

## 2022-04-07 DIAGNOSIS — L299 Pruritus, unspecified: Secondary | ICD-10-CM | POA: Diagnosis not present

## 2022-04-07 DIAGNOSIS — N2581 Secondary hyperparathyroidism of renal origin: Secondary | ICD-10-CM | POA: Diagnosis not present

## 2022-04-07 DIAGNOSIS — Z992 Dependence on renal dialysis: Secondary | ICD-10-CM | POA: Diagnosis not present

## 2022-04-07 NOTE — Telephone Encounter (Signed)
Requesting a new wheelchair, please call pt back.

## 2022-04-08 NOTE — Telephone Encounter (Signed)
Patient states it will be 5 years that she has had current manual w/c at the end of 2023. She understands that [facility] will only pay for new equipment every 5 years. States she feels unsafe in current w/c. States there is a piece missing and it is not latching.   CM sent to [place] at [facility] to see when pt will be due for new w/c and if she can get repairs done in the meantime.

## 2022-04-09 DIAGNOSIS — E1129 Type 2 diabetes mellitus with other diabetic kidney complication: Secondary | ICD-10-CM | POA: Diagnosis not present

## 2022-04-09 DIAGNOSIS — D631 Anemia in chronic kidney disease: Secondary | ICD-10-CM | POA: Diagnosis not present

## 2022-04-09 DIAGNOSIS — N186 End stage renal disease: Secondary | ICD-10-CM | POA: Diagnosis not present

## 2022-04-09 DIAGNOSIS — R519 Headache, unspecified: Secondary | ICD-10-CM | POA: Diagnosis not present

## 2022-04-09 DIAGNOSIS — N2581 Secondary hyperparathyroidism of renal origin: Secondary | ICD-10-CM | POA: Diagnosis not present

## 2022-04-09 DIAGNOSIS — R197 Diarrhea, unspecified: Secondary | ICD-10-CM | POA: Diagnosis not present

## 2022-04-09 DIAGNOSIS — Z992 Dependence on renal dialysis: Secondary | ICD-10-CM | POA: Diagnosis not present

## 2022-04-09 DIAGNOSIS — L299 Pruritus, unspecified: Secondary | ICD-10-CM | POA: Diagnosis not present

## 2022-04-11 DIAGNOSIS — N186 End stage renal disease: Secondary | ICD-10-CM | POA: Diagnosis not present

## 2022-04-11 DIAGNOSIS — D631 Anemia in chronic kidney disease: Secondary | ICD-10-CM | POA: Diagnosis not present

## 2022-04-11 DIAGNOSIS — N2581 Secondary hyperparathyroidism of renal origin: Secondary | ICD-10-CM | POA: Diagnosis not present

## 2022-04-11 DIAGNOSIS — Z992 Dependence on renal dialysis: Secondary | ICD-10-CM | POA: Diagnosis not present

## 2022-04-11 DIAGNOSIS — R519 Headache, unspecified: Secondary | ICD-10-CM | POA: Diagnosis not present

## 2022-04-11 DIAGNOSIS — R197 Diarrhea, unspecified: Secondary | ICD-10-CM | POA: Diagnosis not present

## 2022-04-11 DIAGNOSIS — E8770 Fluid overload, unspecified: Secondary | ICD-10-CM | POA: Diagnosis not present

## 2022-04-13 IMAGING — CR DG CHEST 2V
2 series · 2 of 2 positions shown · non-contrast
Comparison: 07/10/2021

CLINICAL DATA: Chest pain

EXAM:
CHEST - 2 VIEW

[chest lat]
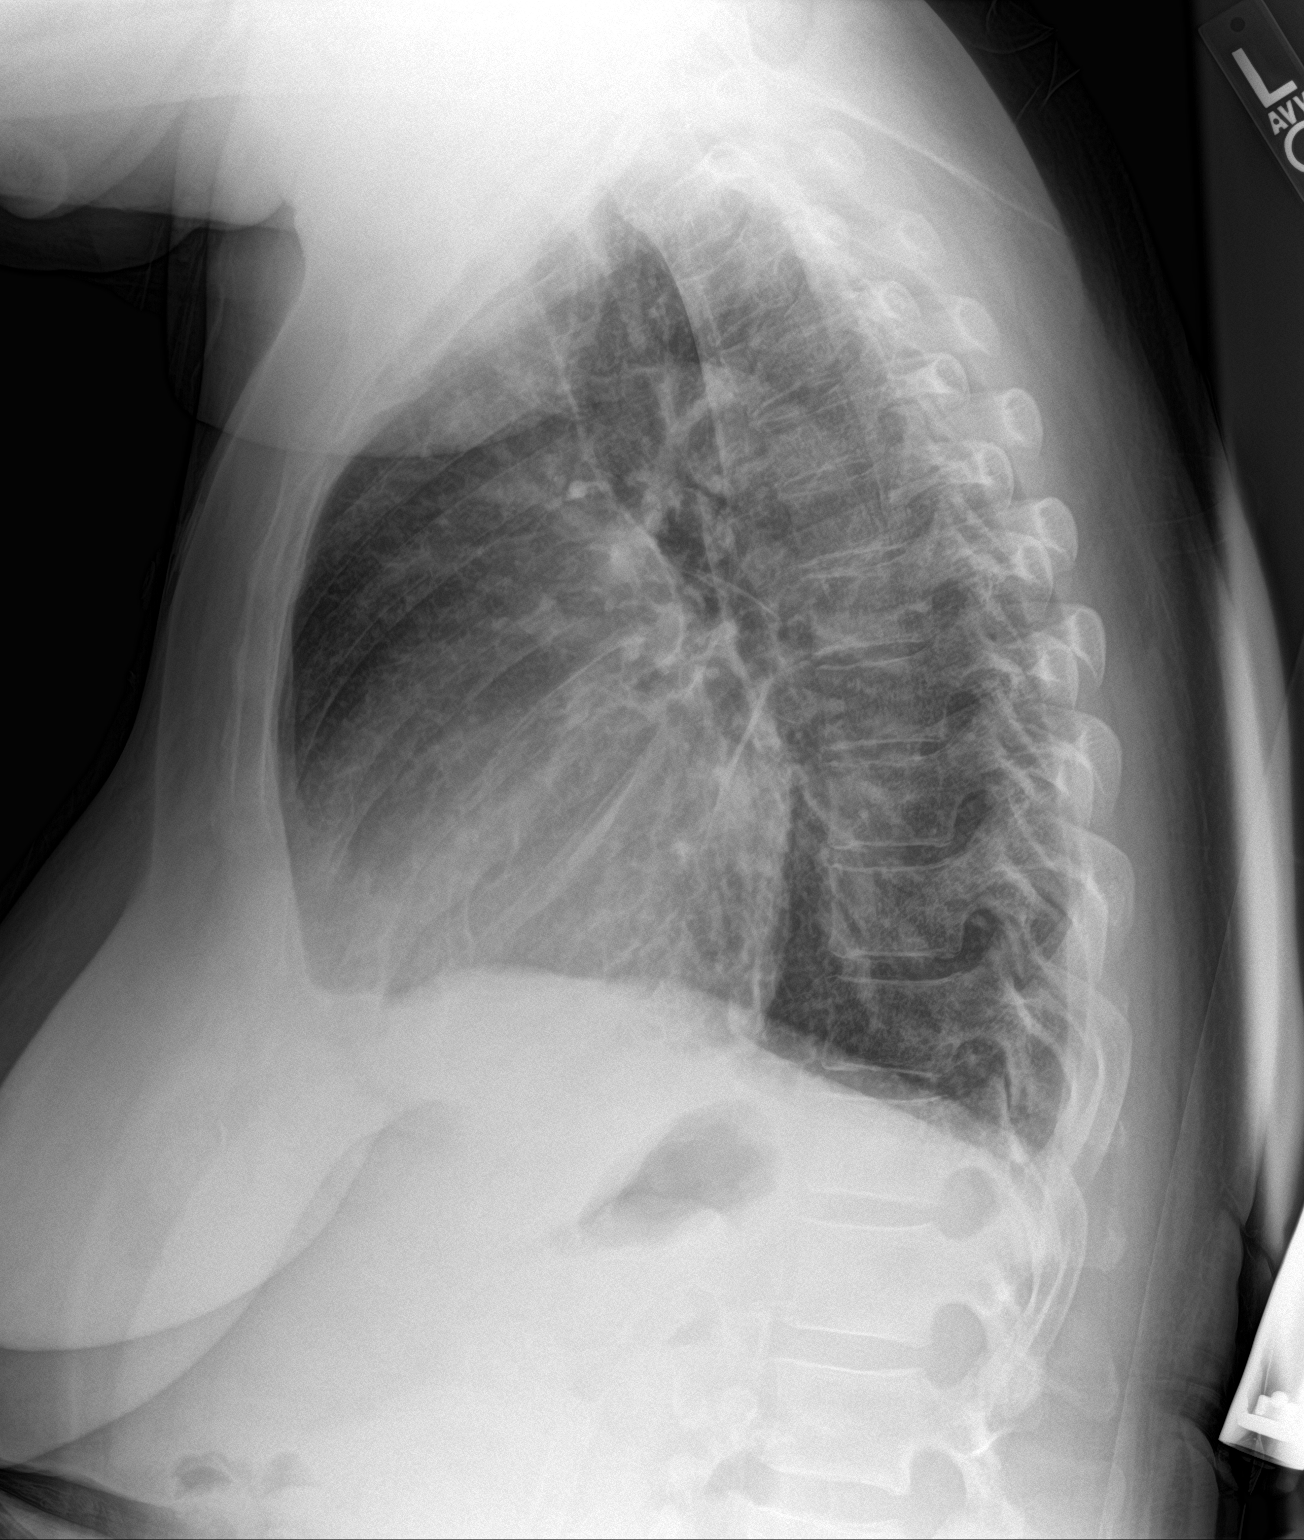

[chest ap]
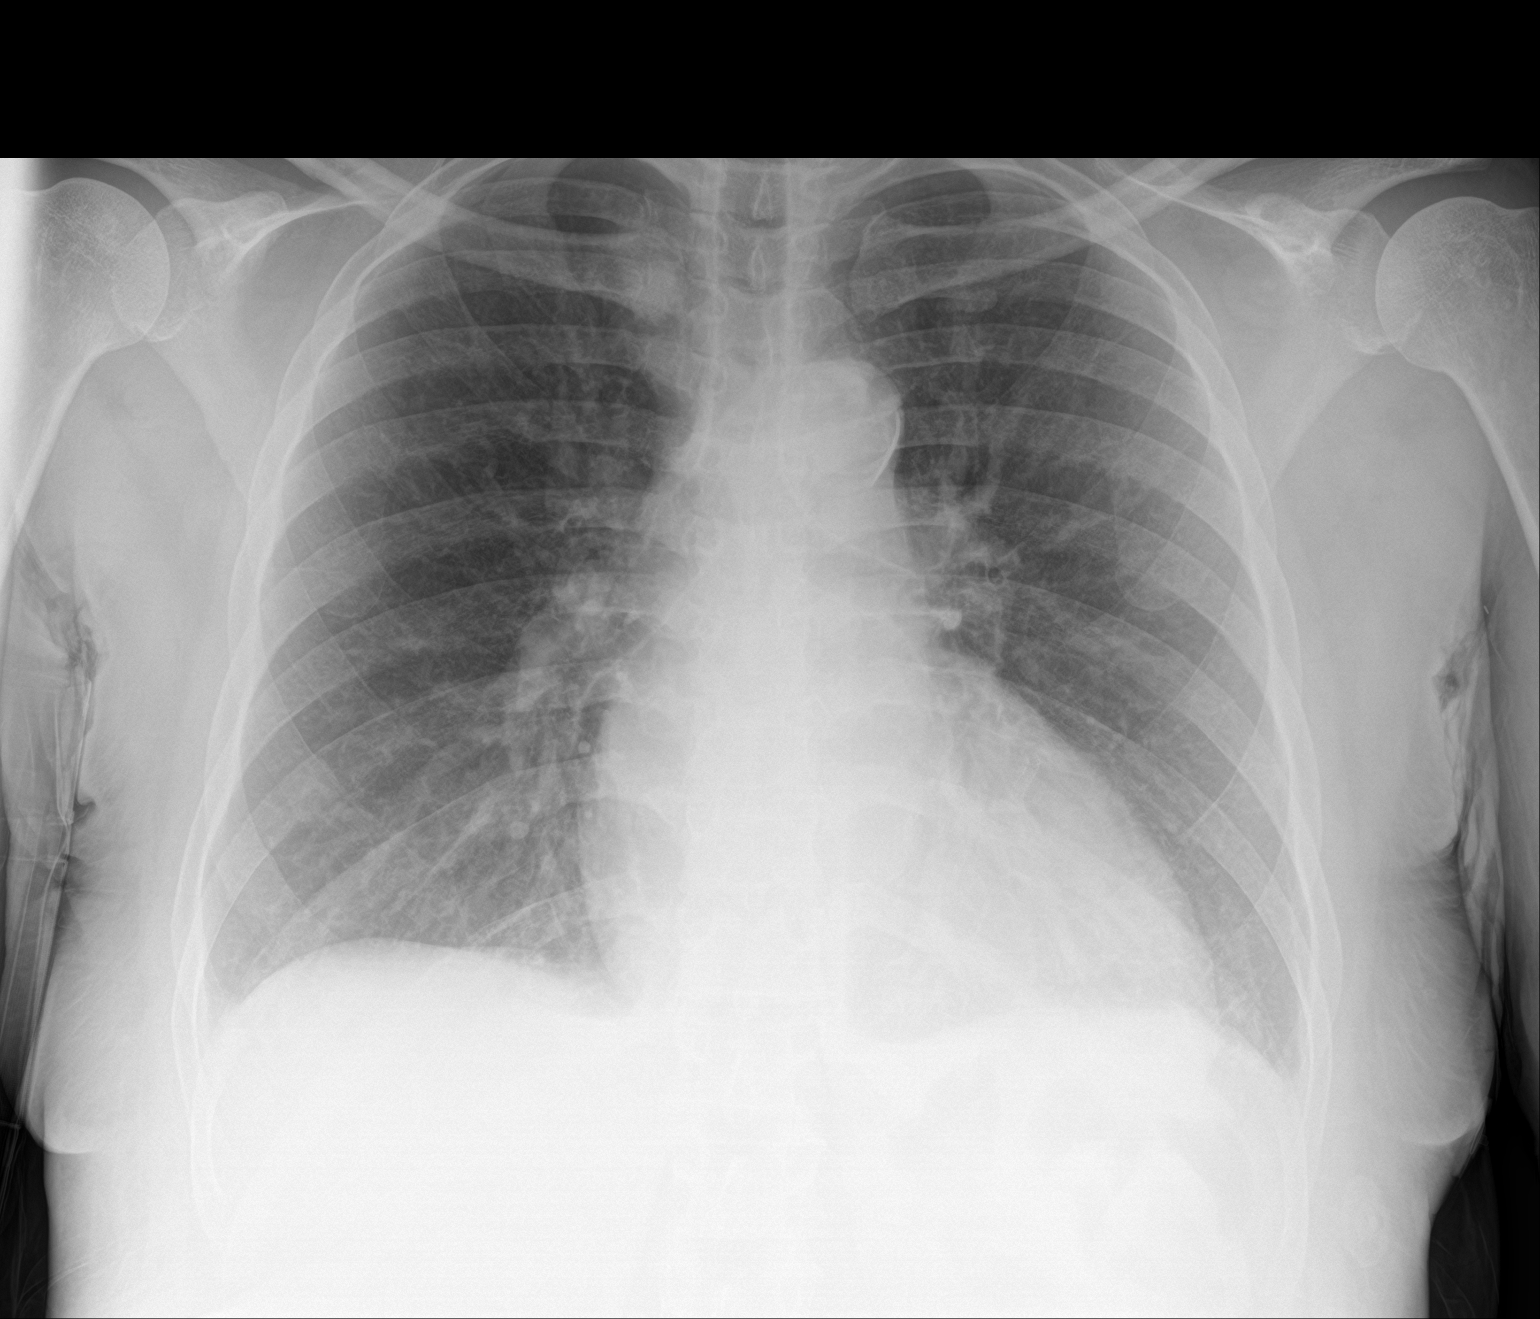

[2 of 2 positions shown; findings below may reference images not displayed]

FINDINGS: Aortic atherosclerotic calcifications. The heart size and
mediastinal contours are within normal limits. Both lungs are clear.
The visualized skeletal structures are unremarkable.
IMPRESSION: No active cardiopulmonary abnormalities.

## 2022-04-13 IMAGING — CT CT HEAD W/O CM
3 series · 16 of 47 positions shown, 19 images · non-contrast
Comparison: CT head dated July 10, 2021

CLINICAL DATA: Headache, chronic, new features or increased
frequency



[Series 4: head 5.0 h30s · axial · 0.45mm/px · z∈[-107,+43]mm · 10 of 36 slices shown, 13 images]
[im 3/36  brain]
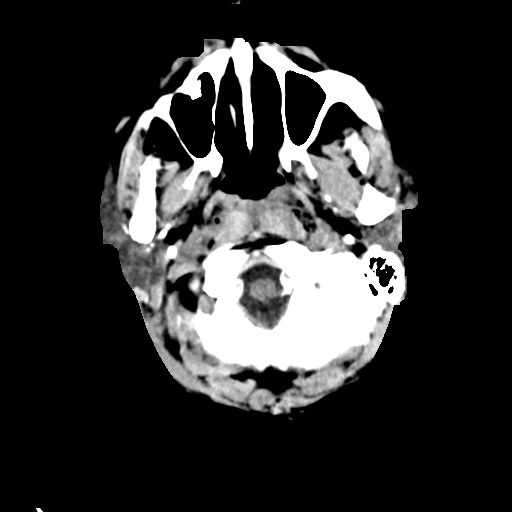
[im 3/36  bone]
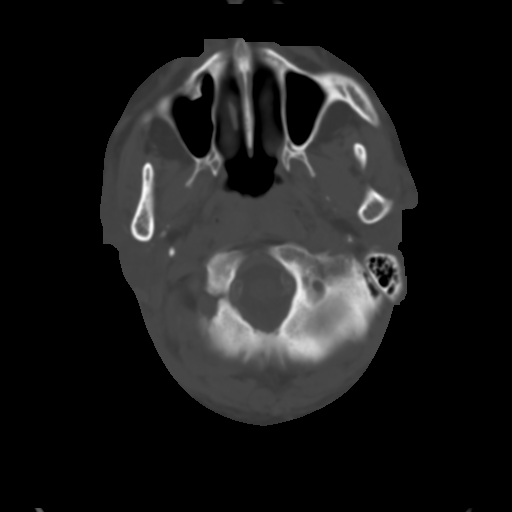
[im 7/36  brain]
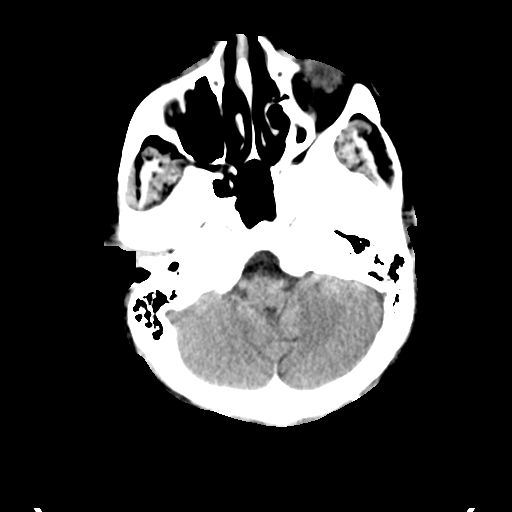
[im 10/36  brain]
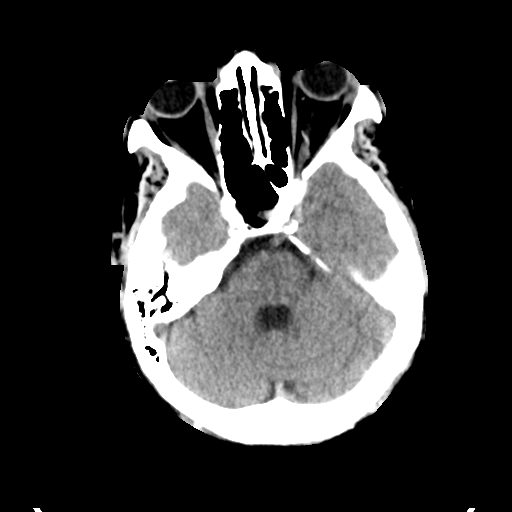
[im 13/36  brain]
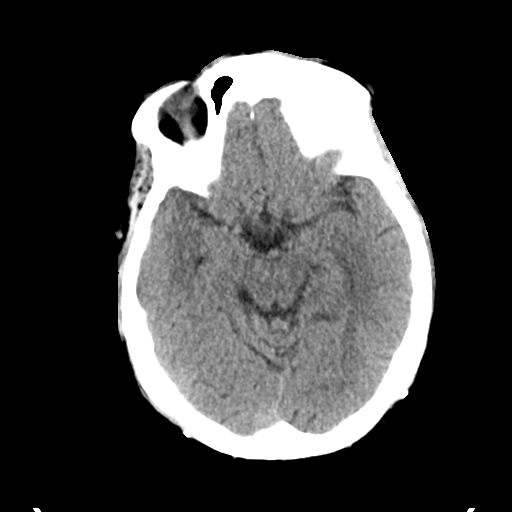
[im 16/36  brain]
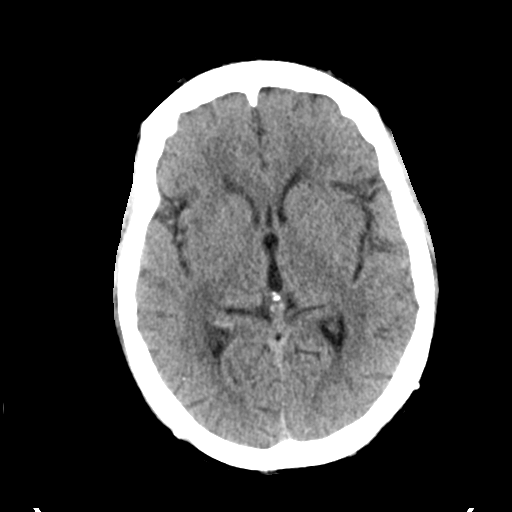
[im 16/36  bone]
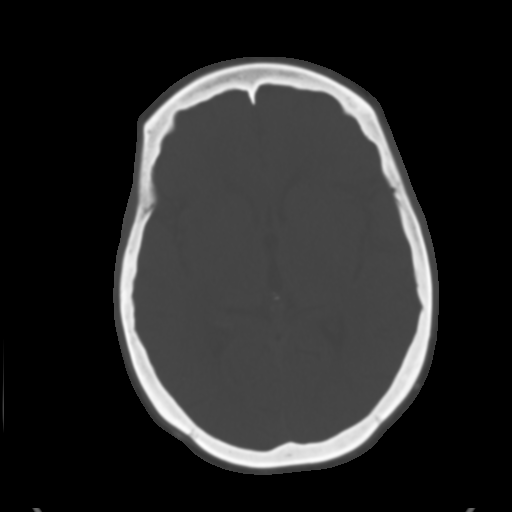
[im 20/36  brain]
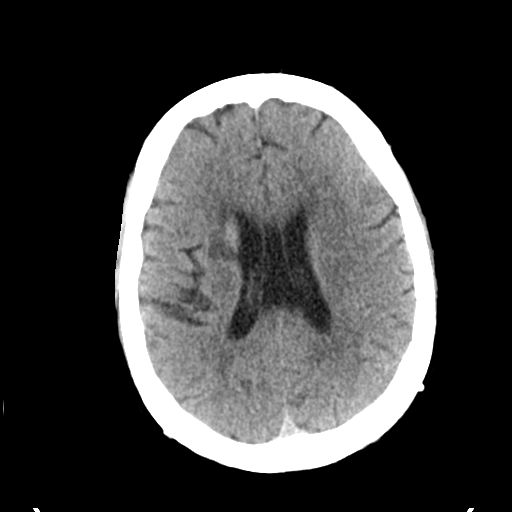
[im 23/36  brain]
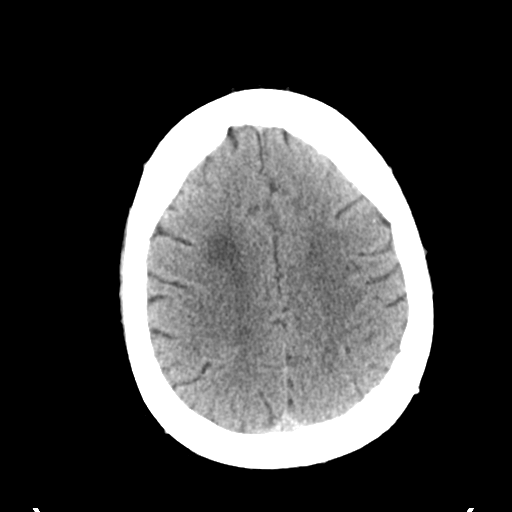
[im 27/36  brain]
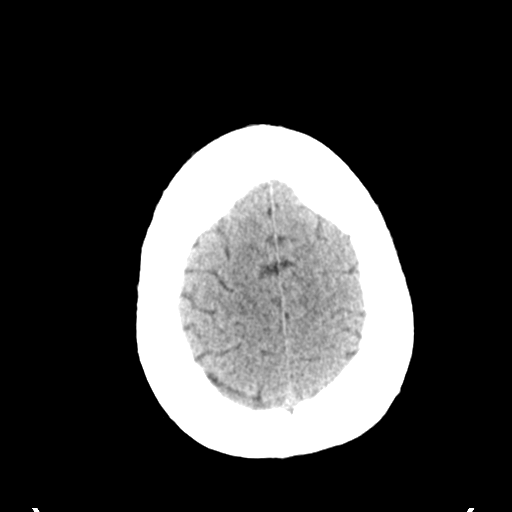
[im 29/36  brain]
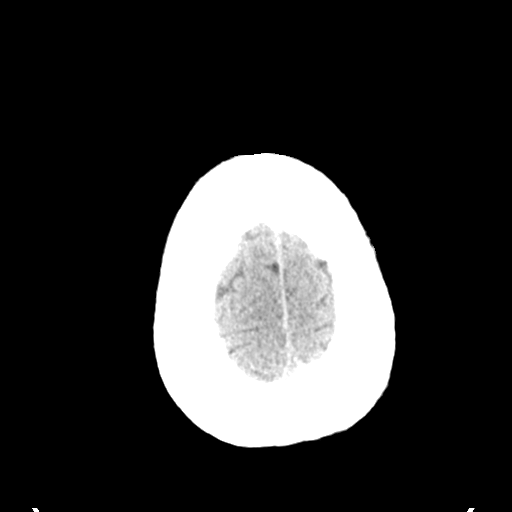
[im 29/36  bone]
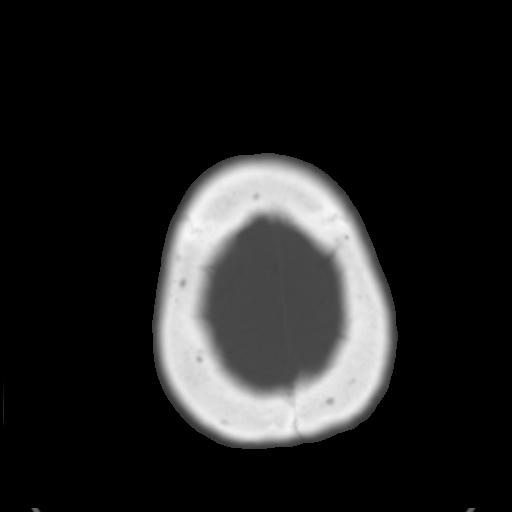
[im 33/36  brain]
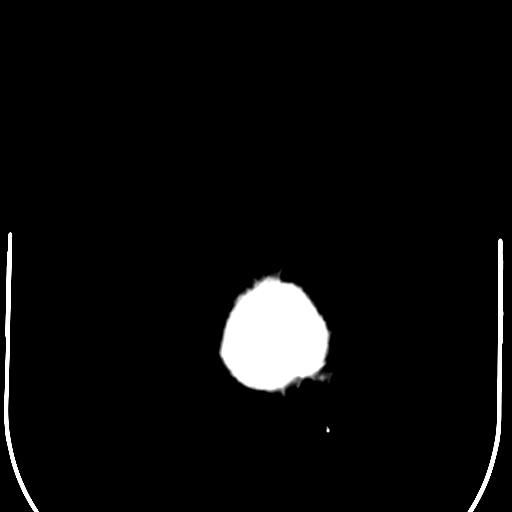

[Series 5: head 3.0 mpr cor · coronal · 0.34mm/px · 3 of 69 slices shown]
[im 23/69  brain]
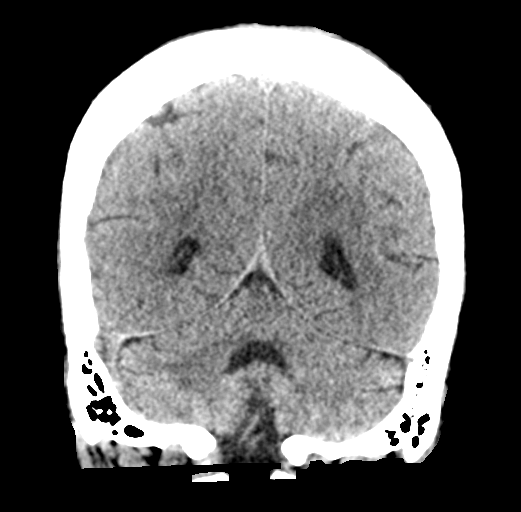
[im 31/69  brain]
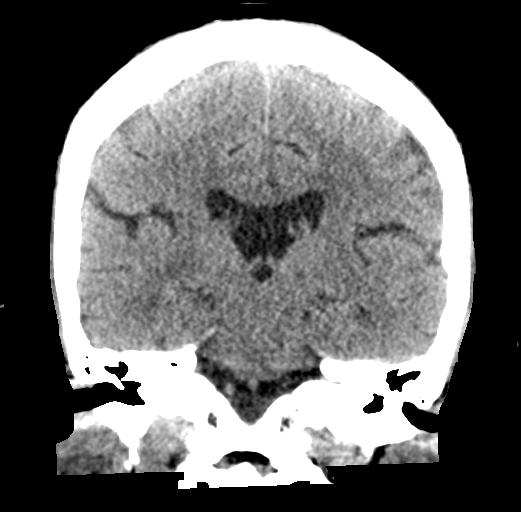
[im 38/69  brain]
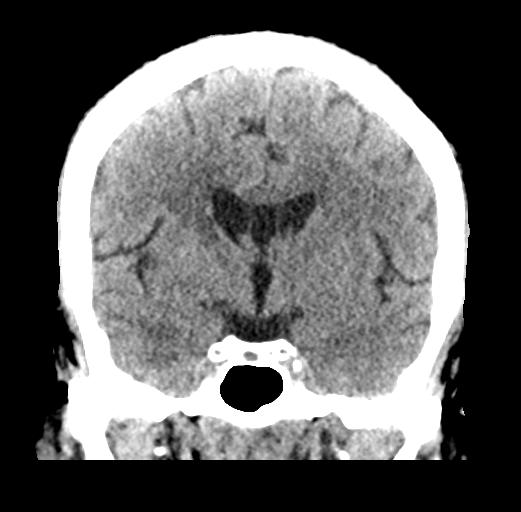

[Series 6: head 3.0 mpr sag · sagittal · 0.35mm/px · 3 of 57 slices shown]
[im 19/57  brain]
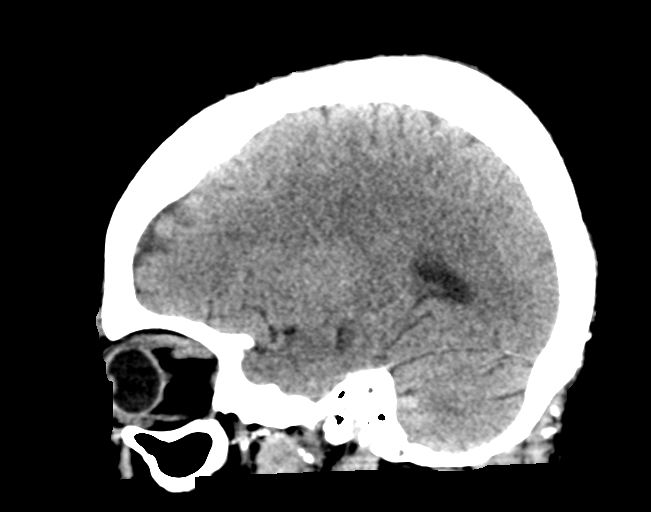
[im 29/57  brain]
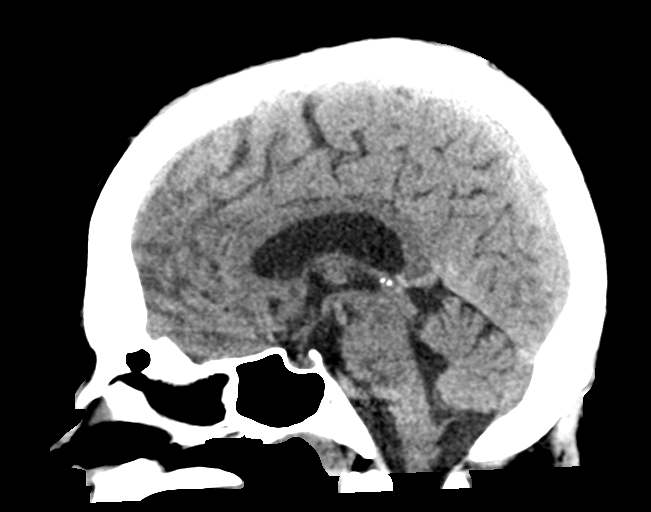
[im 38/57  brain]
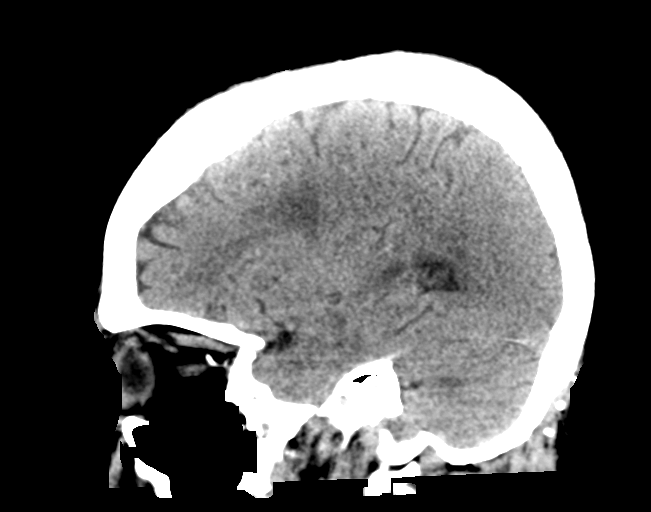

[16 of 47 positions shown; findings below may reference images not displayed]

FINDINGS: Brain: No evidence of acute infarction, hemorrhage, hydrocephalus,
extra-axial collection or mass lesion/mass effect. Hypodensity in
the right periventricular white matter, unchanged. Cavum septum
pellucidum and cavum vergae.

Vascular: No hyperdense vessel or unexpected calcification.

Skull: Normal. Negative for fracture or focal lesion.

Sinuses/Orbits: No acute finding.

Other: None.
IMPRESSION: 1. No acute intracranial abnormality.
2. Chronic right periventricular infarct, unchanged.

## 2022-04-14 ENCOUNTER — Other Ambulatory Visit (HOSPITAL_COMMUNITY): Payer: Self-pay

## 2022-04-14 ENCOUNTER — Other Ambulatory Visit: Payer: Self-pay | Admitting: Internal Medicine

## 2022-04-14 DIAGNOSIS — B20 Human immunodeficiency virus [HIV] disease: Secondary | ICD-10-CM

## 2022-04-14 MED ORDER — BIKTARVY 50-200-25 MG PO TABS
1.0000 | ORAL_TABLET | Freq: Every day | ORAL | 1 refills | Status: DC
  Filled 2022-04-14: qty 30, 30d supply, fill #0
  Filled 2022-05-06: qty 30, 30d supply, fill #1

## 2022-04-16 ENCOUNTER — Other Ambulatory Visit (HOSPITAL_COMMUNITY): Payer: Self-pay

## 2022-04-16 DIAGNOSIS — Z992 Dependence on renal dialysis: Secondary | ICD-10-CM | POA: Diagnosis not present

## 2022-04-16 DIAGNOSIS — R519 Headache, unspecified: Secondary | ICD-10-CM | POA: Diagnosis not present

## 2022-04-16 DIAGNOSIS — R197 Diarrhea, unspecified: Secondary | ICD-10-CM | POA: Diagnosis not present

## 2022-04-16 DIAGNOSIS — N2581 Secondary hyperparathyroidism of renal origin: Secondary | ICD-10-CM | POA: Diagnosis not present

## 2022-04-16 DIAGNOSIS — E8770 Fluid overload, unspecified: Secondary | ICD-10-CM | POA: Diagnosis not present

## 2022-04-16 DIAGNOSIS — D631 Anemia in chronic kidney disease: Secondary | ICD-10-CM | POA: Diagnosis not present

## 2022-04-16 DIAGNOSIS — N186 End stage renal disease: Secondary | ICD-10-CM | POA: Diagnosis not present

## 2022-04-18 DIAGNOSIS — R519 Headache, unspecified: Secondary | ICD-10-CM | POA: Diagnosis not present

## 2022-04-18 DIAGNOSIS — N2581 Secondary hyperparathyroidism of renal origin: Secondary | ICD-10-CM | POA: Diagnosis not present

## 2022-04-18 DIAGNOSIS — E8770 Fluid overload, unspecified: Secondary | ICD-10-CM | POA: Diagnosis not present

## 2022-04-18 DIAGNOSIS — N186 End stage renal disease: Secondary | ICD-10-CM | POA: Diagnosis not present

## 2022-04-18 DIAGNOSIS — R197 Diarrhea, unspecified: Secondary | ICD-10-CM | POA: Diagnosis not present

## 2022-04-18 DIAGNOSIS — Z992 Dependence on renal dialysis: Secondary | ICD-10-CM | POA: Diagnosis not present

## 2022-04-18 DIAGNOSIS — D631 Anemia in chronic kidney disease: Secondary | ICD-10-CM | POA: Diagnosis not present

## 2022-04-20 ENCOUNTER — Ambulatory Visit: Payer: Medicare Other

## 2022-04-20 ENCOUNTER — Encounter: Payer: Medicare Other | Admitting: Student

## 2022-04-21 DIAGNOSIS — R519 Headache, unspecified: Secondary | ICD-10-CM | POA: Diagnosis not present

## 2022-04-21 DIAGNOSIS — N2581 Secondary hyperparathyroidism of renal origin: Secondary | ICD-10-CM | POA: Diagnosis not present

## 2022-04-21 DIAGNOSIS — Z992 Dependence on renal dialysis: Secondary | ICD-10-CM | POA: Diagnosis not present

## 2022-04-21 DIAGNOSIS — D631 Anemia in chronic kidney disease: Secondary | ICD-10-CM | POA: Diagnosis not present

## 2022-04-21 DIAGNOSIS — N186 End stage renal disease: Secondary | ICD-10-CM | POA: Diagnosis not present

## 2022-04-21 DIAGNOSIS — E8770 Fluid overload, unspecified: Secondary | ICD-10-CM | POA: Diagnosis not present

## 2022-04-21 DIAGNOSIS — R197 Diarrhea, unspecified: Secondary | ICD-10-CM | POA: Diagnosis not present

## 2022-04-23 ENCOUNTER — Other Ambulatory Visit: Payer: Self-pay | Admitting: Student

## 2022-04-23 DIAGNOSIS — R519 Headache, unspecified: Secondary | ICD-10-CM | POA: Diagnosis not present

## 2022-04-23 DIAGNOSIS — D631 Anemia in chronic kidney disease: Secondary | ICD-10-CM | POA: Diagnosis not present

## 2022-04-23 DIAGNOSIS — N2581 Secondary hyperparathyroidism of renal origin: Secondary | ICD-10-CM | POA: Diagnosis not present

## 2022-04-23 DIAGNOSIS — E8770 Fluid overload, unspecified: Secondary | ICD-10-CM | POA: Diagnosis not present

## 2022-04-23 DIAGNOSIS — R197 Diarrhea, unspecified: Secondary | ICD-10-CM | POA: Diagnosis not present

## 2022-04-23 DIAGNOSIS — Z992 Dependence on renal dialysis: Secondary | ICD-10-CM | POA: Diagnosis not present

## 2022-04-23 DIAGNOSIS — N186 End stage renal disease: Secondary | ICD-10-CM | POA: Diagnosis not present

## 2022-04-23 NOTE — Progress Notes (Signed)
Received page around 10:30PM on 04/22/2022. Returned call and spoke with Kathy Frank. She states that she has been feeling jittery since earlier today. She reports that she took her BP and it was 220s/120s. Denies any headache, blurry vision, chest pain, palpitations, SHOB, fevers, chills. She reports that she is only taking losartan currently, is not taking amlodipine or hydralazine. Advised patient to immediately seek care at the nearest ED (which she states is [facility]). Concerned for hypertensive urgency/emergency, requiring close monitoring of BP and initiation of antihypertensive therapy (likely via IV). She confirms understanding and states she will go.

## 2022-04-25 DIAGNOSIS — E8770 Fluid overload, unspecified: Secondary | ICD-10-CM | POA: Diagnosis not present

## 2022-04-25 DIAGNOSIS — N2581 Secondary hyperparathyroidism of renal origin: Secondary | ICD-10-CM | POA: Diagnosis not present

## 2022-04-25 DIAGNOSIS — N186 End stage renal disease: Secondary | ICD-10-CM | POA: Diagnosis not present

## 2022-04-25 DIAGNOSIS — D631 Anemia in chronic kidney disease: Secondary | ICD-10-CM | POA: Diagnosis not present

## 2022-04-25 DIAGNOSIS — Z992 Dependence on renal dialysis: Secondary | ICD-10-CM | POA: Diagnosis not present

## 2022-04-25 DIAGNOSIS — R197 Diarrhea, unspecified: Secondary | ICD-10-CM | POA: Diagnosis not present

## 2022-04-25 DIAGNOSIS — R519 Headache, unspecified: Secondary | ICD-10-CM | POA: Diagnosis not present

## 2022-04-27 ENCOUNTER — Other Ambulatory Visit: Payer: Self-pay

## 2022-04-27 ENCOUNTER — Emergency Department (HOSPITAL_COMMUNITY)
Admission: EM | Admit: 2022-04-27 | Discharge: 2022-04-28 | Disposition: A | Discharge status: Home / Self Care | Payer: Medicare Other | Attending: Emergency Medicine | Admitting: Emergency Medicine

## 2022-04-27 ENCOUNTER — Encounter (HOSPITAL_COMMUNITY): Payer: Self-pay | Admitting: Emergency Medicine

## 2022-04-27 DIAGNOSIS — I120 Hypertensive chronic kidney disease with stage 5 chronic kidney disease or end stage renal disease: Secondary | ICD-10-CM | POA: Insufficient documentation

## 2022-04-27 DIAGNOSIS — Z794 Long term (current) use of insulin: Secondary | ICD-10-CM | POA: Diagnosis not present

## 2022-04-27 DIAGNOSIS — N186 End stage renal disease: Secondary | ICD-10-CM | POA: Insufficient documentation

## 2022-04-27 DIAGNOSIS — E1122 Type 2 diabetes mellitus with diabetic chronic kidney disease: Secondary | ICD-10-CM | POA: Insufficient documentation

## 2022-04-27 DIAGNOSIS — Z21 Asymptomatic human immunodeficiency virus [HIV] infection status: Secondary | ICD-10-CM | POA: Insufficient documentation

## 2022-04-27 DIAGNOSIS — Z7982 Long term (current) use of aspirin: Secondary | ICD-10-CM | POA: Diagnosis not present

## 2022-04-27 DIAGNOSIS — Z992 Dependence on renal dialysis: Secondary | ICD-10-CM | POA: Diagnosis not present

## 2022-04-27 DIAGNOSIS — Z79899 Other long term (current) drug therapy: Secondary | ICD-10-CM | POA: Insufficient documentation

## 2022-04-27 DIAGNOSIS — I10 Essential (primary) hypertension: Secondary | ICD-10-CM

## 2022-04-27 DIAGNOSIS — R519 Headache, unspecified: Secondary | ICD-10-CM | POA: Diagnosis not present

## 2022-04-27 DIAGNOSIS — Z7902 Long term (current) use of antithrombotics/antiplatelets: Secondary | ICD-10-CM | POA: Diagnosis not present

## 2022-04-27 LAB — COMPREHENSIVE METABOLIC PANEL
ALT: 8 U/L (ref 0–44)
AST: 11 U/L — ABNORMAL LOW (ref 15–41)
Albumin: 3.1 g/dL — ABNORMAL LOW (ref 3.5–5.0)
Alkaline Phosphatase: 38 U/L (ref 38–126)
Anion gap: 22 — ABNORMAL HIGH (ref 5–15)
BUN: 68 mg/dL — ABNORMAL HIGH (ref 6–20)
CO2: 22 mmol/L (ref 22–32)
Calcium: 9.7 mg/dL (ref 8.9–10.3)
Chloride: 91 mmol/L — ABNORMAL LOW (ref 98–111)
Creatinine, Ser: 10.66 mg/dL — ABNORMAL HIGH (ref 0.44–1.00)
GFR, Estimated: 4 mL/min — ABNORMAL LOW (ref >60)
Glucose, Bld: 176 mg/dL — ABNORMAL HIGH (ref 70–99)
Potassium: 4.6 mmol/L (ref 3.5–5.1)
Sodium: 135 mmol/L (ref 135–145)
Total Bilirubin: 0.3 mg/dL (ref 0.3–1.2)
Total Protein: 7 g/dL (ref 6.5–8.1)

## 2022-04-27 LAB — CBC WITH DIFFERENTIAL/PLATELET
Abs Immature Granulocytes: 0.01 10*3/uL (ref 0.00–0.07)
Basophils Absolute: 0.1 10*3/uL (ref 0.0–0.1)
Basophils Relative: 2 %
Eosinophils Absolute: 0.3 10*3/uL (ref 0.0–0.5)
Eosinophils Relative: 5 %
HCT: 29.2 % — ABNORMAL LOW (ref 36.0–46.0)
Hemoglobin: 9.5 g/dL — ABNORMAL LOW (ref 12.0–15.0)
Immature Granulocytes: 0 %
Lymphocytes Relative: 30 %
Lymphs Abs: 1.7 10*3/uL (ref 0.7–4.0)
MCH: 31.8 pg (ref 26.0–34.0)
MCHC: 32.5 g/dL (ref 30.0–36.0)
MCV: 97.7 fL (ref 80.0–100.0)
Monocytes Absolute: 0.5 10*3/uL (ref 0.1–1.0)
Monocytes Relative: 9 %
Neutro Abs: 2.9 10*3/uL (ref 1.7–7.7)
Neutrophils Relative %: 54 %
Platelets: 300 10*3/uL (ref 150–400)
RBC: 2.99 MIL/uL — ABNORMAL LOW (ref 3.87–5.11)
RDW: 15.9 % — ABNORMAL HIGH (ref 11.5–15.5)
WBC: 5.4 10*3/uL (ref 4.0–10.5)
nRBC: 0.6 % — ABNORMAL HIGH (ref 0.0–0.2)

## 2022-04-27 LAB — TROPONIN I (HIGH SENSITIVITY): Troponin I (High Sensitivity): 73 ng/L — ABNORMAL HIGH (ref <18)

## 2022-04-27 LAB — LIPASE, BLOOD: Lipase: 39 U/L (ref 11–51)

## 2022-04-27 NOTE — ED Provider Triage Note (Signed)
Emergency Medicine Provider Triage Evaluation Note  Kathy Frank , a 46 y.o. female  was evaluated in triage.  Pt complains of HA, elevated BP. Hx of similar. ESRD patient.  States has not been able to get her blood pressure under control.  No numbness or weakness.  Also been having some "reflux" type symptoms.  Feels like her bilateral legs lungs are heavy. Last dialysis on Sat. Did not complete full session   Review of Systems  Positive: HA, elevated BP, reflux Negative: Fever, cp, sob  Physical Exam  BP (!) 184/85 (BP Location: Right Arm)   Pulse 92   Temp 98.9 F (37.2 C) (Oral)   Resp 18   SpO2 98%  Gen:   Awake, no distress   Resp:  Normal effort  MSK:   Moves extremities without difficulty. LUE dialysis graft Other:    Medical Decision Making  Medically screening exam initiated at 7:48 PM.  Appropriate orders placed.  [name] was informed that the remainder of the evaluation will be completed by another provider, this initial triage assessment does not replace that evaluation, and the importance of remaining in the ED until their evaluation is complete.  HA, HTN, weakness   Garvin Ellena A, PA-C 04/27/22 1958

## 2022-04-27 NOTE — ED Triage Notes (Signed)
Pt reports her blood pressure is "super high and I now have a migraine."  States she last had dialysis on Saturday "only missed 1 hour b/c I missed the bus."

## 2022-04-28 ENCOUNTER — Encounter (HOSPITAL_COMMUNITY): Payer: Self-pay

## 2022-04-28 DIAGNOSIS — N186 End stage renal disease: Secondary | ICD-10-CM | POA: Diagnosis not present

## 2022-04-28 DIAGNOSIS — E8770 Fluid overload, unspecified: Secondary | ICD-10-CM | POA: Diagnosis not present

## 2022-04-28 DIAGNOSIS — N2581 Secondary hyperparathyroidism of renal origin: Secondary | ICD-10-CM | POA: Diagnosis not present

## 2022-04-28 DIAGNOSIS — R197 Diarrhea, unspecified: Secondary | ICD-10-CM | POA: Diagnosis not present

## 2022-04-28 DIAGNOSIS — Z992 Dependence on renal dialysis: Secondary | ICD-10-CM | POA: Diagnosis not present

## 2022-04-28 DIAGNOSIS — R519 Headache, unspecified: Secondary | ICD-10-CM | POA: Diagnosis not present

## 2022-04-28 DIAGNOSIS — D631 Anemia in chronic kidney disease: Secondary | ICD-10-CM | POA: Diagnosis not present

## 2022-04-28 MED ORDER — PROCHLORPERAZINE EDISYLATE 10 MG/2ML IJ SOLN
10.0000 mg | Freq: Once | INTRAMUSCULAR | Status: AC
  Administered 2022-04-28: 10 mg via INTRAMUSCULAR
  Filled 2022-04-28: qty 2

## 2022-04-28 MED ORDER — DIPHENHYDRAMINE HCL 50 MG/ML IJ SOLN
12.5000 mg | Freq: Once | INTRAMUSCULAR | Status: AC
  Administered 2022-04-28: 12.5 mg via INTRAMUSCULAR
  Filled 2022-04-28: qty 1

## 2022-04-28 NOTE — Discharge Instructions (Addendum)
You were treated today for a headache while in the emergency department.  Please go to your dialysis session as scheduled.  I spoke with the charge nurse at [facility] who states they will still be able to see you today.  It appears that your blood pressure may be related to missing dialysis a month ago.  It is extremely important that you make your dialysis sessions as often as possible to schedule.

## 2022-04-28 NOTE — ED Provider Notes (Signed)
[facility] EMERGENCY DEPARTMENT Provider Note   CSN: [id number] Arrival date & time: 04/27/22  1926     History  Chief Complaint  Patient presents with   Hypertension   Headache    Kathy Frank is a 46 y.o. female.  Patient presents to the hospital complaining of a headache with elevated blood pressure. The patient has ESRD and had her last dialysis session on Saturday. The patient states she did not complete the full dialysis session, missing one hour due to missing her bus. She denies numbness or weakness at this time. She describes a right sided headache with photophobia. She denies nausea. She endorses some "reflux" type symptoms.  She states that her blood pressures have been running high since missing a dialysis session approximately a month ago. Past medical history significant for type II DM, ESRD on dialysis, hypertension, migraines, HIV, GERD, gastroparesis, erosive esophagitis, depression, anxiety. The patient states she does still produce urine, BKA on the right, AKA on the left  HPI     Home Medications Prior to Admission medications   Medication Sig Start Date End Date Taking? Authorizing Provider  Accu-Chek Softclix Lancets lancets Use as instructed 05/02/21   Atway, Rayann N, DO  albuterol (VENTOLIN HFA) 108 (90 Base) MCG/ACT inhaler Inhale 2 puffs into the lungs every 4 (four) hours as needed for wheezing or shortness of breath. 02/14/21   Angiulli, Lavon Paganini, PA-C  amLODipine (NORVASC) 10 MG tablet Take 1 tablet (10 mg total) by mouth daily. 05/15/21   [name], MD  atorvastatin (LIPITOR) 80 MG tablet Take 1 tablet (80 mg total) by mouth daily. 05/24/21   Masters, Katie, DO  bictegravir-emtricitabine-tenofovir AF (BIKTARVY) 50-200-25 MG TABS tablet TAKE 1 TABLET BY MOUTH DAILY. 04/14/22 05/16/22  Michel Bickers, MD  calcitRIOL (ROCALTROL) 0.25 MCG capsule Take 1 capsule (0.25 mcg total) by mouth Every Tuesday,Thursday,and Saturday with dialysis.  04/01/21   Ulyses Amor, PA-C  calcium acetate (PHOSLO) 667 MG capsule Take 3 capsules (2,001 mg total) by mouth 3 (three) times daily with meals. 02/14/21   Angiulli, Lavon Paganini, PA-C  clopidogrel (PLAVIX) 75 MG tablet Take 1 tablet by mouth once daily 02/06/22   Waynetta Sandy, MD  Continuous Blood Gluc Receiver (DEXCOM G7 RECEIVER) DEVI Use to check blood sugars cntinuously 12/09/21   [name], MD  Continuous Blood Gluc Sensor ([place]) MISC Use to check blood sugar continuously 12/09/21   [name], MD  EQ ASPIRIN ADULT LOW DOSE 81 MG tablet TAKE 1 TABLET BY MOUTH ONCE DAILY. SWALLOW WHOLE. 02/06/22   Waynetta Sandy, MD  glucose blood test strip Use as instructed 05/02/21   Atway, Rayann N, DO  hydrALAZINE (APRESOLINE) 50 MG tablet Take 1 tablet by mouth 4 times daily 03/02/22   [name], MD  insulin glargine (LANTUS SOLOSTAR) 100 UNIT/ML Solostar Pen Inject 8 Units into the skin at bedtime. 02/06/22 04/05/28  [name], MD  Insulin Pen Needle (PENTIPS) 32G X 4 MM MISC Use as directed 02/14/21   Meredith Staggers, MD  liraglutide (VICTOZA) 18 MG/3ML SOPN Inject 0.6 mg into the skin daily. 02/06/22 02/01/23  [name], MD  loperamide (IMODIUM A-D) 2 MG tablet Take 2 mg by mouth 4 (four) times daily as needed for diarrhea or loose stools.    [provider]  multivitamin (RENA-VIT) TABS tablet Take 1 tablet by mouth daily. Patient taking differently: Take 1 tablet by mouth See admin instructions. On Tuesday,  Wednesday and Thursday 02/14/21   Angiulli, Lavon Paganini, PA-C  polyethylene glycol (MIRALAX / GLYCOLAX) 17 g packet Take 17 g by mouth 2 (two) times daily. Patient taking differently: Take 17 g by mouth daily as needed for mild constipation or moderate constipation. 02/14/21   Angiulli, Lavon Paganini, PA-C  propranolol (INDERAL) 20 MG tablet Take 1 tablet (20 mg total) by mouth every 8 (eight) hours. 09/03/21   Tobb, Kardie, DO  sertraline (ZOLOFT) 50 MG  tablet Take 1 tablet (50 mg total) by mouth at bedtime. Patient not taking: Reported on 09/05/2021 02/14/21   Cathlyn Parsons, PA-C      Allergies    Patient has no known allergies.    Review of Systems   Review of Systems  Constitutional:  Negative for fever.  Eyes:  Positive for photophobia.  Respiratory:  Negative for shortness of breath.   Cardiovascular:  Negative for chest pain and leg swelling.  Gastrointestinal:  Negative for abdominal pain, nausea and vomiting.  Genitourinary:  Negative for dysuria.  Neurological:  Positive for headaches. Negative for seizures, syncope, facial asymmetry, speech difficulty, weakness, light-headedness and numbness.    Physical Exam Updated Vital Signs BP (!) 211/95   Pulse 92   Temp (!) 97 F (36.1 C) (Tympanic)   Resp (!) 24   Ht 5\' 5"  (1.651 m)   Wt 71.8 kg   SpO2 100%   BMI 26.34 kg/m  Physical Exam Vitals and nursing note reviewed.  Constitutional:      General: She is not in acute distress.    Appearance: She is well-developed.  HENT:     Head: Normocephalic and atraumatic.  Eyes:     Extraocular Movements: Extraocular movements intact.     Right eye: No nystagmus.     Left eye: No nystagmus.     Conjunctiva/sclera: Conjunctivae normal.     Pupils: Pupils are equal, round, and reactive to light.  Cardiovascular:     Rate and Rhythm: Normal rate and regular rhythm.     Heart sounds: No murmur heard. Pulmonary:     Effort: Pulmonary effort is normal. No respiratory distress.     Breath sounds: Normal breath sounds.  Abdominal:     Palpations: Abdomen is soft.     Tenderness: There is no abdominal tenderness.  Musculoskeletal:        General: No swelling.     Cervical back: Neck supple.  Skin:    General: Skin is warm and dry.     Capillary Refill: Capillary refill takes less than 2 seconds.  Neurological:     Mental Status: She is alert.     Cranial Nerves: No facial asymmetry.     Sensory: No sensory deficit.      Motor: No weakness.     Comments: CN II-VII, XI, XII intact Patient with baseline gait.  Upper extremity strength equal bilaterally 5/5  Psychiatric:        Mood and Affect: Mood normal.     ED Results / Procedures / Treatments   Labs (all labs ordered are listed, but only abnormal results are displayed) Labs Reviewed  CBC WITH DIFFERENTIAL/PLATELET - Abnormal; Notable for the following components:      Result Value   RBC 2.99 (*)    Hemoglobin 9.5 (*)    HCT 29.2 (*)    RDW 15.9 (*)    nRBC 0.6 (*)    All other components within normal limits  COMPREHENSIVE METABOLIC PANEL - Abnormal;  Notable for the following components:   Chloride 91 (*)    Glucose, Bld 176 (*)    BUN 68 (*)    Creatinine, Ser 10.66 (*)    Albumin 3.1 (*)    AST 11 (*)    GFR, Estimated 4 (*)    Anion gap 22 (*)    All other components within normal limits  TROPONIN I (HIGH SENSITIVITY) - Abnormal; Notable for the following components:   Troponin I (High Sensitivity) 73 (*)    All other components within normal limits  LIPASE, BLOOD  I-STAT BETA HCG BLOOD, ED ([facility], [facility], [facility] ONLY)  TROPONIN I (HIGH SENSITIVITY)    EKG EKG Interpretation  Date/Time:  Tuesday April 28 2022 04:03:42 EDT Ventricular Rate:  88 PR Interval:  166 QRS Duration: 88 QT Interval:  380 QTC Calculation: 459 R Axis:   32 Text Interpretation: Normal sinus rhythm Possible Anterior infarct , age undetermined Abnormal ECG When compared with ECG of 27-Apr-2022 20:50, PREVIOUS ECG IS PRESENT Confirmed by Thayer Jew [phone]) on 04/28/2022 6:13:47 AM  Radiology No results found.  Procedures Procedures    Medications Ordered in ED Medications  prochlorperazine (COMPAZINE) injection 10 mg (10 mg Intramuscular Given 04/28/22 0738)  diphenhydrAMINE (BENADRYL) injection 12.5 mg (12.5 mg Intramuscular Given 04/28/22 0736)    ED Course/ Medical Decision Making/ A&P                           Medical Decision  Making  Patient presents to the hospital with a chief complaint of headache with hypertension. Differential includes but is not limited to migraine, cluster headache, hypertensive urgency, CVA, and others  I reviewed the patient's past medical history.  She was admitted to the hospital in April of this year for severe symptomatic hypertension which included nausea, vomiting, and shortness of breath.  Notes from that time states that they feel that the hypertension is likely due to both missed dialysis sessions and noncompliance with medications  I ordered and reviewed lab work.  Pertinent results include CBC with hemoglobin of 5.5 consistent with baseline, troponin 73 consistent with previous values, creatinine 10.66 consistent with patient's baseline, lipase 39  The patient's headache is not the worst of her life, was not sudden onset, and is consistent with previous migraines.  I see no indication for imaging at this time  I ordered the patient Compazine and Benadryl for her headache.  Upon reassessment the patient had improved.  The patient has no neurologic deficits at this time.  No clinical suspicion of CVA at this time.  The patient's headache is consistent with previous migraines and seems likely to be a migraine.  Her headache improved with medication ministration.  I feel the headaches may be due to the patient's consistently high blood pressure. This is likely at least partly due to missed dialysis sessions.  The patient's nephrologist and internal medicine doctor both work on her blood pressure control.  She denies missing medications.  She states that her blood pressure has been high since missing dialysis a month ago, but she also missed an hour of dialysis on Saturday.  Plan to discharge patient to allow her to get to dialysis.  I spoke with the charge nurse at [facility] who stated that they could still get the patient to a chair today.  Discharge at this time        Final Clinical  Impression(s) / ED Diagnoses Final diagnoses:  Hypertension,  unspecified type  ESRD on dialysis [facility])  Bad headache    Rx / DC Orders ED Discharge Orders     None         Ronny Bacon 04/28/22 8833    Merryl Hacker, MD 04/29/22 [phone]

## 2022-04-28 NOTE — ED Notes (Signed)
This RN attempted x2 to get IV - IV team order to be placed

## 2022-04-29 ENCOUNTER — Telehealth: Payer: Self-pay

## 2022-04-29 NOTE — Patient Outreach (Signed)
  Care Coordination [facility] Note Transition Care Management Unsuccessful Follow-up Telephone Call  Date of discharge and from where:  [facility] 04/28/22  Attempts:  1st Attempt  Reason for unsuccessful TCM follow-up call:  No answer/busy  Johnney Killian, RN, BSN, CCM Care Management Coordinator [facility] Phone: [phone]: [phone]

## 2022-04-30 ENCOUNTER — Telehealth: Payer: Self-pay

## 2022-04-30 DIAGNOSIS — N2581 Secondary hyperparathyroidism of renal origin: Secondary | ICD-10-CM | POA: Diagnosis not present

## 2022-04-30 DIAGNOSIS — R197 Diarrhea, unspecified: Secondary | ICD-10-CM | POA: Diagnosis not present

## 2022-04-30 DIAGNOSIS — D631 Anemia in chronic kidney disease: Secondary | ICD-10-CM | POA: Diagnosis not present

## 2022-04-30 DIAGNOSIS — N186 End stage renal disease: Secondary | ICD-10-CM | POA: Diagnosis not present

## 2022-04-30 DIAGNOSIS — R519 Headache, unspecified: Secondary | ICD-10-CM | POA: Diagnosis not present

## 2022-04-30 DIAGNOSIS — Z992 Dependence on renal dialysis: Secondary | ICD-10-CM | POA: Diagnosis not present

## 2022-04-30 DIAGNOSIS — E8770 Fluid overload, unspecified: Secondary | ICD-10-CM | POA: Diagnosis not present

## 2022-04-30 NOTE — Patient Outreach (Signed)
  Care Coordination TOC Note Transition Care Management Unsuccessful Follow-up Telephone Call  Date of discharge and from where:  [facility] 04/28/22  Attempts:  2nd Attempt  Reason for unsuccessful TCM follow-up call:  Unable to leave message

## 2022-05-02 DIAGNOSIS — R197 Diarrhea, unspecified: Secondary | ICD-10-CM | POA: Diagnosis not present

## 2022-05-02 DIAGNOSIS — E8770 Fluid overload, unspecified: Secondary | ICD-10-CM | POA: Diagnosis not present

## 2022-05-02 DIAGNOSIS — D631 Anemia in chronic kidney disease: Secondary | ICD-10-CM | POA: Diagnosis not present

## 2022-05-02 DIAGNOSIS — N186 End stage renal disease: Secondary | ICD-10-CM | POA: Diagnosis not present

## 2022-05-02 DIAGNOSIS — R519 Headache, unspecified: Secondary | ICD-10-CM | POA: Diagnosis not present

## 2022-05-02 DIAGNOSIS — Z992 Dependence on renal dialysis: Secondary | ICD-10-CM | POA: Diagnosis not present

## 2022-05-02 DIAGNOSIS — N2581 Secondary hyperparathyroidism of renal origin: Secondary | ICD-10-CM | POA: Diagnosis not present

## 2022-05-04 DIAGNOSIS — R519 Headache, unspecified: Secondary | ICD-10-CM | POA: Diagnosis not present

## 2022-05-04 DIAGNOSIS — D631 Anemia in chronic kidney disease: Secondary | ICD-10-CM | POA: Diagnosis not present

## 2022-05-04 DIAGNOSIS — N186 End stage renal disease: Secondary | ICD-10-CM | POA: Diagnosis not present

## 2022-05-04 DIAGNOSIS — E8770 Fluid overload, unspecified: Secondary | ICD-10-CM | POA: Diagnosis not present

## 2022-05-04 DIAGNOSIS — R197 Diarrhea, unspecified: Secondary | ICD-10-CM | POA: Diagnosis not present

## 2022-05-04 DIAGNOSIS — N2581 Secondary hyperparathyroidism of renal origin: Secondary | ICD-10-CM | POA: Diagnosis not present

## 2022-05-04 DIAGNOSIS — Z992 Dependence on renal dialysis: Secondary | ICD-10-CM | POA: Diagnosis not present

## 2022-05-05 DIAGNOSIS — D631 Anemia in chronic kidney disease: Secondary | ICD-10-CM | POA: Diagnosis not present

## 2022-05-05 DIAGNOSIS — N186 End stage renal disease: Secondary | ICD-10-CM | POA: Diagnosis not present

## 2022-05-05 DIAGNOSIS — Z992 Dependence on renal dialysis: Secondary | ICD-10-CM | POA: Diagnosis not present

## 2022-05-05 DIAGNOSIS — R197 Diarrhea, unspecified: Secondary | ICD-10-CM | POA: Diagnosis not present

## 2022-05-05 DIAGNOSIS — R519 Headache, unspecified: Secondary | ICD-10-CM | POA: Diagnosis not present

## 2022-05-05 DIAGNOSIS — N2581 Secondary hyperparathyroidism of renal origin: Secondary | ICD-10-CM | POA: Diagnosis not present

## 2022-05-05 DIAGNOSIS — E8770 Fluid overload, unspecified: Secondary | ICD-10-CM | POA: Diagnosis not present

## 2022-05-06 ENCOUNTER — Other Ambulatory Visit (HOSPITAL_COMMUNITY): Payer: Self-pay

## 2022-05-07 ENCOUNTER — Telehealth: Payer: Self-pay

## 2022-05-07 DIAGNOSIS — D631 Anemia in chronic kidney disease: Secondary | ICD-10-CM | POA: Diagnosis not present

## 2022-05-07 DIAGNOSIS — N2581 Secondary hyperparathyroidism of renal origin: Secondary | ICD-10-CM | POA: Diagnosis not present

## 2022-05-07 DIAGNOSIS — E8770 Fluid overload, unspecified: Secondary | ICD-10-CM | POA: Diagnosis not present

## 2022-05-07 DIAGNOSIS — N186 End stage renal disease: Secondary | ICD-10-CM | POA: Diagnosis not present

## 2022-05-07 DIAGNOSIS — Z992 Dependence on renal dialysis: Secondary | ICD-10-CM | POA: Diagnosis not present

## 2022-05-07 DIAGNOSIS — R519 Headache, unspecified: Secondary | ICD-10-CM | POA: Diagnosis not present

## 2022-05-07 DIAGNOSIS — R197 Diarrhea, unspecified: Secondary | ICD-10-CM | POA: Diagnosis not present

## 2022-05-07 NOTE — Telephone Encounter (Signed)
     Patient  visit on 04/28/2022  at [facility]   Have you been able to follow up with your primary care physician?YES  The patient was or was not able to obtain any needed medicine or equipment. YES  Are there diet recommendations that you are having difficulty following? NA  Patient expresses understanding of discharge instructions and education provided has no other needs at this time.  [address], [facility], Care Management  [phone] [address], [place], [place] [address] Phone: [phone] Email: [email or web address]

## 2022-05-09 DIAGNOSIS — D631 Anemia in chronic kidney disease: Secondary | ICD-10-CM | POA: Diagnosis not present

## 2022-05-09 DIAGNOSIS — R519 Headache, unspecified: Secondary | ICD-10-CM | POA: Diagnosis not present

## 2022-05-09 DIAGNOSIS — E1129 Type 2 diabetes mellitus with other diabetic kidney complication: Secondary | ICD-10-CM | POA: Diagnosis not present

## 2022-05-09 DIAGNOSIS — R197 Diarrhea, unspecified: Secondary | ICD-10-CM | POA: Diagnosis not present

## 2022-05-09 DIAGNOSIS — E8770 Fluid overload, unspecified: Secondary | ICD-10-CM | POA: Diagnosis not present

## 2022-05-09 DIAGNOSIS — N186 End stage renal disease: Secondary | ICD-10-CM | POA: Diagnosis not present

## 2022-05-09 DIAGNOSIS — N2581 Secondary hyperparathyroidism of renal origin: Secondary | ICD-10-CM | POA: Diagnosis not present

## 2022-05-09 DIAGNOSIS — Z992 Dependence on renal dialysis: Secondary | ICD-10-CM | POA: Diagnosis not present

## 2022-05-11 ENCOUNTER — Other Ambulatory Visit (HOSPITAL_COMMUNITY): Payer: Self-pay

## 2022-05-12 DIAGNOSIS — R519 Headache, unspecified: Secondary | ICD-10-CM | POA: Diagnosis not present

## 2022-05-12 DIAGNOSIS — E1110 Type 2 diabetes mellitus with ketoacidosis without coma: Secondary | ICD-10-CM | POA: Diagnosis not present

## 2022-05-12 DIAGNOSIS — L299 Pruritus, unspecified: Secondary | ICD-10-CM | POA: Diagnosis not present

## 2022-05-12 DIAGNOSIS — R197 Diarrhea, unspecified: Secondary | ICD-10-CM | POA: Diagnosis not present

## 2022-05-12 DIAGNOSIS — Z23 Encounter for immunization: Secondary | ICD-10-CM | POA: Diagnosis not present

## 2022-05-12 DIAGNOSIS — N186 End stage renal disease: Secondary | ICD-10-CM | POA: Diagnosis not present

## 2022-05-12 DIAGNOSIS — N2581 Secondary hyperparathyroidism of renal origin: Secondary | ICD-10-CM | POA: Diagnosis not present

## 2022-05-12 DIAGNOSIS — D631 Anemia in chronic kidney disease: Secondary | ICD-10-CM | POA: Diagnosis not present

## 2022-05-12 DIAGNOSIS — Z992 Dependence on renal dialysis: Secondary | ICD-10-CM | POA: Diagnosis not present

## 2022-05-14 DIAGNOSIS — N186 End stage renal disease: Secondary | ICD-10-CM | POA: Diagnosis not present

## 2022-05-14 DIAGNOSIS — N2581 Secondary hyperparathyroidism of renal origin: Secondary | ICD-10-CM | POA: Diagnosis not present

## 2022-05-14 DIAGNOSIS — Z23 Encounter for immunization: Secondary | ICD-10-CM | POA: Diagnosis not present

## 2022-05-14 DIAGNOSIS — D631 Anemia in chronic kidney disease: Secondary | ICD-10-CM | POA: Diagnosis not present

## 2022-05-14 DIAGNOSIS — R197 Diarrhea, unspecified: Secondary | ICD-10-CM | POA: Diagnosis not present

## 2022-05-14 DIAGNOSIS — R519 Headache, unspecified: Secondary | ICD-10-CM | POA: Diagnosis not present

## 2022-05-14 DIAGNOSIS — Z992 Dependence on renal dialysis: Secondary | ICD-10-CM | POA: Diagnosis not present

## 2022-05-14 DIAGNOSIS — L299 Pruritus, unspecified: Secondary | ICD-10-CM | POA: Diagnosis not present

## 2022-05-14 DIAGNOSIS — E1110 Type 2 diabetes mellitus with ketoacidosis without coma: Secondary | ICD-10-CM | POA: Diagnosis not present

## 2022-05-16 DIAGNOSIS — E1110 Type 2 diabetes mellitus with ketoacidosis without coma: Secondary | ICD-10-CM | POA: Diagnosis not present

## 2022-05-16 DIAGNOSIS — R519 Headache, unspecified: Secondary | ICD-10-CM | POA: Diagnosis not present

## 2022-05-16 DIAGNOSIS — N2581 Secondary hyperparathyroidism of renal origin: Secondary | ICD-10-CM | POA: Diagnosis not present

## 2022-05-16 DIAGNOSIS — N186 End stage renal disease: Secondary | ICD-10-CM | POA: Diagnosis not present

## 2022-05-16 DIAGNOSIS — R197 Diarrhea, unspecified: Secondary | ICD-10-CM | POA: Diagnosis not present

## 2022-05-16 DIAGNOSIS — L299 Pruritus, unspecified: Secondary | ICD-10-CM | POA: Diagnosis not present

## 2022-05-16 DIAGNOSIS — D631 Anemia in chronic kidney disease: Secondary | ICD-10-CM | POA: Diagnosis not present

## 2022-05-16 DIAGNOSIS — Z992 Dependence on renal dialysis: Secondary | ICD-10-CM | POA: Diagnosis not present

## 2022-05-16 DIAGNOSIS — Z23 Encounter for immunization: Secondary | ICD-10-CM | POA: Diagnosis not present

## 2022-05-19 DIAGNOSIS — N186 End stage renal disease: Secondary | ICD-10-CM | POA: Diagnosis not present

## 2022-05-19 DIAGNOSIS — E1110 Type 2 diabetes mellitus with ketoacidosis without coma: Secondary | ICD-10-CM | POA: Diagnosis not present

## 2022-05-19 DIAGNOSIS — L299 Pruritus, unspecified: Secondary | ICD-10-CM | POA: Diagnosis not present

## 2022-05-19 DIAGNOSIS — N2581 Secondary hyperparathyroidism of renal origin: Secondary | ICD-10-CM | POA: Diagnosis not present

## 2022-05-19 DIAGNOSIS — R197 Diarrhea, unspecified: Secondary | ICD-10-CM | POA: Diagnosis not present

## 2022-05-19 DIAGNOSIS — Z23 Encounter for immunization: Secondary | ICD-10-CM | POA: Diagnosis not present

## 2022-05-19 DIAGNOSIS — D631 Anemia in chronic kidney disease: Secondary | ICD-10-CM | POA: Diagnosis not present

## 2022-05-19 DIAGNOSIS — R519 Headache, unspecified: Secondary | ICD-10-CM | POA: Diagnosis not present

## 2022-05-19 DIAGNOSIS — Z992 Dependence on renal dialysis: Secondary | ICD-10-CM | POA: Diagnosis not present

## 2022-05-21 DIAGNOSIS — Z23 Encounter for immunization: Secondary | ICD-10-CM | POA: Diagnosis not present

## 2022-05-21 DIAGNOSIS — Z992 Dependence on renal dialysis: Secondary | ICD-10-CM | POA: Diagnosis not present

## 2022-05-21 DIAGNOSIS — D631 Anemia in chronic kidney disease: Secondary | ICD-10-CM | POA: Diagnosis not present

## 2022-05-21 DIAGNOSIS — N2581 Secondary hyperparathyroidism of renal origin: Secondary | ICD-10-CM | POA: Diagnosis not present

## 2022-05-21 DIAGNOSIS — R519 Headache, unspecified: Secondary | ICD-10-CM | POA: Diagnosis not present

## 2022-05-21 DIAGNOSIS — L299 Pruritus, unspecified: Secondary | ICD-10-CM | POA: Diagnosis not present

## 2022-05-21 DIAGNOSIS — R197 Diarrhea, unspecified: Secondary | ICD-10-CM | POA: Diagnosis not present

## 2022-05-21 DIAGNOSIS — E1110 Type 2 diabetes mellitus with ketoacidosis without coma: Secondary | ICD-10-CM | POA: Diagnosis not present

## 2022-05-21 DIAGNOSIS — N186 End stage renal disease: Secondary | ICD-10-CM | POA: Diagnosis not present

## 2022-05-23 DIAGNOSIS — D631 Anemia in chronic kidney disease: Secondary | ICD-10-CM | POA: Diagnosis not present

## 2022-05-23 DIAGNOSIS — R519 Headache, unspecified: Secondary | ICD-10-CM | POA: Diagnosis not present

## 2022-05-23 DIAGNOSIS — Z23 Encounter for immunization: Secondary | ICD-10-CM | POA: Diagnosis not present

## 2022-05-23 DIAGNOSIS — E1110 Type 2 diabetes mellitus with ketoacidosis without coma: Secondary | ICD-10-CM | POA: Diagnosis not present

## 2022-05-23 DIAGNOSIS — L299 Pruritus, unspecified: Secondary | ICD-10-CM | POA: Diagnosis not present

## 2022-05-23 DIAGNOSIS — R197 Diarrhea, unspecified: Secondary | ICD-10-CM | POA: Diagnosis not present

## 2022-05-23 DIAGNOSIS — N186 End stage renal disease: Secondary | ICD-10-CM | POA: Diagnosis not present

## 2022-05-23 DIAGNOSIS — N2581 Secondary hyperparathyroidism of renal origin: Secondary | ICD-10-CM | POA: Diagnosis not present

## 2022-05-23 DIAGNOSIS — Z992 Dependence on renal dialysis: Secondary | ICD-10-CM | POA: Diagnosis not present

## 2022-05-26 DIAGNOSIS — D631 Anemia in chronic kidney disease: Secondary | ICD-10-CM | POA: Diagnosis not present

## 2022-05-26 DIAGNOSIS — R519 Headache, unspecified: Secondary | ICD-10-CM | POA: Diagnosis not present

## 2022-05-26 DIAGNOSIS — R197 Diarrhea, unspecified: Secondary | ICD-10-CM | POA: Diagnosis not present

## 2022-05-26 DIAGNOSIS — N186 End stage renal disease: Secondary | ICD-10-CM | POA: Diagnosis not present

## 2022-05-26 DIAGNOSIS — N2581 Secondary hyperparathyroidism of renal origin: Secondary | ICD-10-CM | POA: Diagnosis not present

## 2022-05-26 DIAGNOSIS — E1110 Type 2 diabetes mellitus with ketoacidosis without coma: Secondary | ICD-10-CM | POA: Diagnosis not present

## 2022-05-26 DIAGNOSIS — Z23 Encounter for immunization: Secondary | ICD-10-CM | POA: Diagnosis not present

## 2022-05-26 DIAGNOSIS — Z992 Dependence on renal dialysis: Secondary | ICD-10-CM | POA: Diagnosis not present

## 2022-05-26 DIAGNOSIS — L299 Pruritus, unspecified: Secondary | ICD-10-CM | POA: Diagnosis not present

## 2022-05-28 ENCOUNTER — Other Ambulatory Visit (HOSPITAL_COMMUNITY): Payer: Self-pay

## 2022-05-28 DIAGNOSIS — Z23 Encounter for immunization: Secondary | ICD-10-CM | POA: Diagnosis not present

## 2022-05-28 DIAGNOSIS — E1110 Type 2 diabetes mellitus with ketoacidosis without coma: Secondary | ICD-10-CM | POA: Diagnosis not present

## 2022-05-28 DIAGNOSIS — N2581 Secondary hyperparathyroidism of renal origin: Secondary | ICD-10-CM | POA: Diagnosis not present

## 2022-05-28 DIAGNOSIS — R519 Headache, unspecified: Secondary | ICD-10-CM | POA: Diagnosis not present

## 2022-05-28 DIAGNOSIS — L299 Pruritus, unspecified: Secondary | ICD-10-CM | POA: Diagnosis not present

## 2022-05-28 DIAGNOSIS — N186 End stage renal disease: Secondary | ICD-10-CM | POA: Diagnosis not present

## 2022-05-28 DIAGNOSIS — R197 Diarrhea, unspecified: Secondary | ICD-10-CM | POA: Diagnosis not present

## 2022-05-28 DIAGNOSIS — Z992 Dependence on renal dialysis: Secondary | ICD-10-CM | POA: Diagnosis not present

## 2022-05-28 DIAGNOSIS — D631 Anemia in chronic kidney disease: Secondary | ICD-10-CM | POA: Diagnosis not present

## 2022-05-30 DIAGNOSIS — N186 End stage renal disease: Secondary | ICD-10-CM | POA: Diagnosis not present

## 2022-05-30 DIAGNOSIS — R519 Headache, unspecified: Secondary | ICD-10-CM | POA: Diagnosis not present

## 2022-05-30 DIAGNOSIS — L299 Pruritus, unspecified: Secondary | ICD-10-CM | POA: Diagnosis not present

## 2022-05-30 DIAGNOSIS — R197 Diarrhea, unspecified: Secondary | ICD-10-CM | POA: Diagnosis not present

## 2022-05-30 DIAGNOSIS — E1110 Type 2 diabetes mellitus with ketoacidosis without coma: Secondary | ICD-10-CM | POA: Diagnosis not present

## 2022-05-30 DIAGNOSIS — D631 Anemia in chronic kidney disease: Secondary | ICD-10-CM | POA: Diagnosis not present

## 2022-05-30 DIAGNOSIS — Z992 Dependence on renal dialysis: Secondary | ICD-10-CM | POA: Diagnosis not present

## 2022-05-30 DIAGNOSIS — Z23 Encounter for immunization: Secondary | ICD-10-CM | POA: Diagnosis not present

## 2022-05-30 DIAGNOSIS — N2581 Secondary hyperparathyroidism of renal origin: Secondary | ICD-10-CM | POA: Diagnosis not present

## 2022-06-02 ENCOUNTER — Other Ambulatory Visit (HOSPITAL_COMMUNITY): Payer: Self-pay

## 2022-06-02 DIAGNOSIS — N2581 Secondary hyperparathyroidism of renal origin: Secondary | ICD-10-CM | POA: Diagnosis not present

## 2022-06-02 DIAGNOSIS — N186 End stage renal disease: Secondary | ICD-10-CM | POA: Diagnosis not present

## 2022-06-02 DIAGNOSIS — D631 Anemia in chronic kidney disease: Secondary | ICD-10-CM | POA: Diagnosis not present

## 2022-06-02 DIAGNOSIS — R197 Diarrhea, unspecified: Secondary | ICD-10-CM | POA: Diagnosis not present

## 2022-06-02 DIAGNOSIS — Z23 Encounter for immunization: Secondary | ICD-10-CM | POA: Diagnosis not present

## 2022-06-02 DIAGNOSIS — E1110 Type 2 diabetes mellitus with ketoacidosis without coma: Secondary | ICD-10-CM | POA: Diagnosis not present

## 2022-06-02 DIAGNOSIS — L299 Pruritus, unspecified: Secondary | ICD-10-CM | POA: Diagnosis not present

## 2022-06-02 DIAGNOSIS — Z992 Dependence on renal dialysis: Secondary | ICD-10-CM | POA: Diagnosis not present

## 2022-06-02 DIAGNOSIS — R519 Headache, unspecified: Secondary | ICD-10-CM | POA: Diagnosis not present

## 2022-06-04 DIAGNOSIS — Z23 Encounter for immunization: Secondary | ICD-10-CM | POA: Diagnosis not present

## 2022-06-04 DIAGNOSIS — D631 Anemia in chronic kidney disease: Secondary | ICD-10-CM | POA: Diagnosis not present

## 2022-06-04 DIAGNOSIS — L299 Pruritus, unspecified: Secondary | ICD-10-CM | POA: Diagnosis not present

## 2022-06-04 DIAGNOSIS — R519 Headache, unspecified: Secondary | ICD-10-CM | POA: Diagnosis not present

## 2022-06-04 DIAGNOSIS — N186 End stage renal disease: Secondary | ICD-10-CM | POA: Diagnosis not present

## 2022-06-04 DIAGNOSIS — Z992 Dependence on renal dialysis: Secondary | ICD-10-CM | POA: Diagnosis not present

## 2022-06-04 DIAGNOSIS — E1110 Type 2 diabetes mellitus with ketoacidosis without coma: Secondary | ICD-10-CM | POA: Diagnosis not present

## 2022-06-04 DIAGNOSIS — R197 Diarrhea, unspecified: Secondary | ICD-10-CM | POA: Diagnosis not present

## 2022-06-04 DIAGNOSIS — N2581 Secondary hyperparathyroidism of renal origin: Secondary | ICD-10-CM | POA: Diagnosis not present

## 2022-06-06 DIAGNOSIS — R519 Headache, unspecified: Secondary | ICD-10-CM | POA: Diagnosis not present

## 2022-06-06 DIAGNOSIS — R197 Diarrhea, unspecified: Secondary | ICD-10-CM | POA: Diagnosis not present

## 2022-06-06 DIAGNOSIS — D631 Anemia in chronic kidney disease: Secondary | ICD-10-CM | POA: Diagnosis not present

## 2022-06-06 DIAGNOSIS — Z992 Dependence on renal dialysis: Secondary | ICD-10-CM | POA: Diagnosis not present

## 2022-06-06 DIAGNOSIS — E1110 Type 2 diabetes mellitus with ketoacidosis without coma: Secondary | ICD-10-CM | POA: Diagnosis not present

## 2022-06-06 DIAGNOSIS — Z23 Encounter for immunization: Secondary | ICD-10-CM | POA: Diagnosis not present

## 2022-06-06 DIAGNOSIS — N2581 Secondary hyperparathyroidism of renal origin: Secondary | ICD-10-CM | POA: Diagnosis not present

## 2022-06-06 DIAGNOSIS — N186 End stage renal disease: Secondary | ICD-10-CM | POA: Diagnosis not present

## 2022-06-06 DIAGNOSIS — L299 Pruritus, unspecified: Secondary | ICD-10-CM | POA: Diagnosis not present

## 2022-06-08 ENCOUNTER — Other Ambulatory Visit (HOSPITAL_COMMUNITY): Payer: Self-pay

## 2022-06-08 ENCOUNTER — Other Ambulatory Visit: Payer: Self-pay | Admitting: Internal Medicine

## 2022-06-08 DIAGNOSIS — B20 Human immunodeficiency virus [HIV] disease: Secondary | ICD-10-CM

## 2022-06-08 MED ORDER — BIKTARVY 50-200-25 MG PO TABS
1.0000 | ORAL_TABLET | Freq: Every day | ORAL | 0 refills | Status: DC
  Filled 2022-06-08: qty 30, 30d supply, fill #0

## 2022-06-09 DIAGNOSIS — Z992 Dependence on renal dialysis: Secondary | ICD-10-CM | POA: Diagnosis not present

## 2022-06-09 DIAGNOSIS — E1129 Type 2 diabetes mellitus with other diabetic kidney complication: Secondary | ICD-10-CM | POA: Diagnosis not present

## 2022-06-09 DIAGNOSIS — D631 Anemia in chronic kidney disease: Secondary | ICD-10-CM | POA: Diagnosis not present

## 2022-06-09 DIAGNOSIS — R197 Diarrhea, unspecified: Secondary | ICD-10-CM | POA: Diagnosis not present

## 2022-06-09 DIAGNOSIS — R519 Headache, unspecified: Secondary | ICD-10-CM | POA: Diagnosis not present

## 2022-06-09 DIAGNOSIS — Z23 Encounter for immunization: Secondary | ICD-10-CM | POA: Diagnosis not present

## 2022-06-09 DIAGNOSIS — N2581 Secondary hyperparathyroidism of renal origin: Secondary | ICD-10-CM | POA: Diagnosis not present

## 2022-06-09 DIAGNOSIS — N186 End stage renal disease: Secondary | ICD-10-CM | POA: Diagnosis not present

## 2022-06-09 DIAGNOSIS — E1110 Type 2 diabetes mellitus with ketoacidosis without coma: Secondary | ICD-10-CM | POA: Diagnosis not present

## 2022-06-09 DIAGNOSIS — L299 Pruritus, unspecified: Secondary | ICD-10-CM | POA: Diagnosis not present

## 2022-06-10 ENCOUNTER — Other Ambulatory Visit (HOSPITAL_COMMUNITY): Payer: Self-pay

## 2022-06-11 ENCOUNTER — Other Ambulatory Visit: Payer: Self-pay

## 2022-06-11 ENCOUNTER — Encounter (HOSPITAL_COMMUNITY): Payer: Self-pay

## 2022-06-11 ENCOUNTER — Emergency Department (HOSPITAL_COMMUNITY)
Admission: EM | Admit: 2022-06-11 | Discharge: 2022-06-12 | Disposition: A | Discharge status: Home / Self Care | Payer: Medicare Other | Attending: Emergency Medicine | Admitting: Emergency Medicine

## 2022-06-11 DIAGNOSIS — Z992 Dependence on renal dialysis: Secondary | ICD-10-CM | POA: Insufficient documentation

## 2022-06-11 DIAGNOSIS — D649 Anemia, unspecified: Secondary | ICD-10-CM | POA: Insufficient documentation

## 2022-06-11 DIAGNOSIS — Z87891 Personal history of nicotine dependence: Secondary | ICD-10-CM | POA: Diagnosis not present

## 2022-06-11 DIAGNOSIS — I2510 Atherosclerotic heart disease of native coronary artery without angina pectoris: Secondary | ICD-10-CM | POA: Insufficient documentation

## 2022-06-11 DIAGNOSIS — Z21 Asymptomatic human immunodeficiency virus [HIV] infection status: Secondary | ICD-10-CM | POA: Insufficient documentation

## 2022-06-11 DIAGNOSIS — R040 Epistaxis: Secondary | ICD-10-CM | POA: Diagnosis not present

## 2022-06-11 DIAGNOSIS — N186 End stage renal disease: Secondary | ICD-10-CM | POA: Insufficient documentation

## 2022-06-11 DIAGNOSIS — E1122 Type 2 diabetes mellitus with diabetic chronic kidney disease: Secondary | ICD-10-CM | POA: Insufficient documentation

## 2022-06-11 DIAGNOSIS — I10 Essential (primary) hypertension: Secondary | ICD-10-CM | POA: Diagnosis not present

## 2022-06-11 DIAGNOSIS — G43009 Migraine without aura, not intractable, without status migrainosus: Secondary | ICD-10-CM

## 2022-06-11 DIAGNOSIS — Z8673 Personal history of transient ischemic attack (TIA), and cerebral infarction without residual deficits: Secondary | ICD-10-CM | POA: Diagnosis not present

## 2022-06-11 DIAGNOSIS — I120 Hypertensive chronic kidney disease with stage 5 chronic kidney disease or end stage renal disease: Secondary | ICD-10-CM | POA: Diagnosis not present

## 2022-06-11 DIAGNOSIS — E875 Hyperkalemia: Secondary | ICD-10-CM | POA: Diagnosis not present

## 2022-06-11 LAB — BASIC METABOLIC PANEL
Anion gap: 22 — ABNORMAL HIGH (ref 5–15)
BUN: 64 mg/dL — ABNORMAL HIGH (ref 6–20)
CO2: 17 mmol/L — ABNORMAL LOW (ref 22–32)
Calcium: 9.4 mg/dL (ref 8.9–10.3)
Chloride: 100 mmol/L (ref 98–111)
Creatinine, Ser: 9.25 mg/dL — ABNORMAL HIGH (ref 0.44–1.00)
GFR, Estimated: 5 mL/min — ABNORMAL LOW (ref >60)
Glucose, Bld: 130 mg/dL — ABNORMAL HIGH (ref 70–99)
Potassium: 7 mmol/L (ref 3.5–5.1)
Sodium: 139 mmol/L (ref 135–145)

## 2022-06-11 LAB — CBC WITH DIFFERENTIAL/PLATELET
Abs Immature Granulocytes: 0.02 10*3/uL (ref 0.00–0.07)
Basophils Absolute: 0.1 10*3/uL (ref 0.0–0.1)
Basophils Relative: 2 %
Eosinophils Absolute: 0.2 10*3/uL (ref 0.0–0.5)
Eosinophils Relative: 4 %
HCT: 29.3 % — ABNORMAL LOW (ref 36.0–46.0)
Hemoglobin: 9.7 g/dL — ABNORMAL LOW (ref 12.0–15.0)
Immature Granulocytes: 0 %
Lymphocytes Relative: 28 %
Lymphs Abs: 1.7 10*3/uL (ref 0.7–4.0)
MCH: 31.7 pg (ref 26.0–34.0)
MCHC: 33.1 g/dL (ref 30.0–36.0)
MCV: 95.8 fL (ref 80.0–100.0)
Monocytes Absolute: 0.5 10*3/uL (ref 0.1–1.0)
Monocytes Relative: 8 %
Neutro Abs: 3.4 10*3/uL (ref 1.7–7.7)
Neutrophils Relative %: 58 %
Platelets: 315 10*3/uL (ref 150–400)
RBC: 3.06 MIL/uL — ABNORMAL LOW (ref 3.87–5.11)
RDW: 15.4 % (ref 11.5–15.5)
WBC: 5.9 10*3/uL (ref 4.0–10.5)
nRBC: 0 % (ref 0.0–0.2)

## 2022-06-11 LAB — MAGNESIUM: Magnesium: 2.5 mg/dL — ABNORMAL HIGH (ref 1.7–2.4)

## 2022-06-11 LAB — HEPATITIS B SURFACE ANTIGEN: Hepatitis B Surface Ag: NONREACTIVE

## 2022-06-11 MED ORDER — LIDOCAINE-EPINEPHRINE (PF) 2 %-1:200000 IJ SOLN
10.0000 mL | Freq: Once | INTRAMUSCULAR | Status: AC
  Administered 2022-06-11: 10 mL
  Filled 2022-06-11: qty 20

## 2022-06-11 MED ORDER — HYDROMORPHONE HCL 1 MG/ML IJ SOLN: 0.5000 mg | Freq: Once | INTRAMUSCULAR | Status: DC

## 2022-06-11 MED ORDER — ACETAMINOPHEN 500 MG PO TABS
1000.0000 mg | ORAL_TABLET | Freq: Once | ORAL | Status: AC
  Administered 2022-06-11: 1000 mg via ORAL
  Filled 2022-06-11: qty 2

## 2022-06-11 MED ORDER — FENTANYL CITRATE PF 50 MCG/ML IJ SOSY: 100.0000 ug | PREFILLED_SYRINGE | Freq: Once | INTRAMUSCULAR | Status: DC

## 2022-06-11 MED ORDER — SODIUM ZIRCONIUM CYCLOSILICATE 5 G PO PACK
5.0000 g | PACK | Freq: Once | ORAL | Status: AC
  Administered 2022-06-11: 5 g via ORAL
  Filled 2022-06-11: qty 1

## 2022-06-11 MED ORDER — PROCHLORPERAZINE MALEATE 5 MG PO TABS
5.0000 mg | ORAL_TABLET | Freq: Once | ORAL | Status: AC
  Administered 2022-06-11: 5 mg via ORAL
  Filled 2022-06-11: qty 1

## 2022-06-11 MED ORDER — OXYMETAZOLINE HCL 0.05 % NA SOLN
1.0000 | Freq: Once | NASAL | Status: AC
  Administered 2022-06-11: 1 via NASAL
  Filled 2022-06-11: qty 30

## 2022-06-11 MED ORDER — CALCIUM GLUCONATE-NACL 1-0.675 GM/50ML-% IV SOLN
1.0000 g | Freq: Once | INTRAVENOUS | Status: AC
  Administered 2022-06-11: 1000 mg via INTRAVENOUS
  Filled 2022-06-11: qty 50

## 2022-06-11 MED ORDER — FENTANYL CITRATE PF 50 MCG/ML IJ SOSY
25.0000 ug | PREFILLED_SYRINGE | Freq: Once | INTRAMUSCULAR | Status: AC
  Administered 2022-06-11: 25 ug via INTRAVENOUS
  Filled 2022-06-11: qty 1

## 2022-06-11 MED ORDER — FENTANYL CITRATE PF 50 MCG/ML IJ SOSY
100.0000 ug | PREFILLED_SYRINGE | Freq: Once | INTRAMUSCULAR | Status: AC
  Administered 2022-06-11: 100 ug via INTRAMUSCULAR
  Filled 2022-06-11: qty 2

## 2022-06-11 MED ORDER — FENTANYL CITRATE PF 50 MCG/ML IJ SOSY
50.0000 ug | PREFILLED_SYRINGE | Freq: Once | INTRAMUSCULAR | Status: AC
  Administered 2022-06-11: 50 ug via INTRAVENOUS
  Filled 2022-06-11: qty 1

## 2022-06-11 MED ORDER — DARBEPOETIN ALFA 60 MCG/0.3ML IJ SOSY: 60.0000 ug | PREFILLED_SYRINGE | Freq: Once | INTRAMUSCULAR | Status: DC

## 2022-06-11 MED ORDER — CALCITRIOL 0.5 MCG PO CAPS
1.2500 ug | ORAL_CAPSULE | Freq: Once | ORAL | Status: AC
  Administered 2022-06-11: 1.25 ug via ORAL
  Filled 2022-06-11: qty 1

## 2022-06-11 MED ORDER — CHLORHEXIDINE GLUCONATE CLOTH 2 % EX PADS: 6.0000 | MEDICATED_PAD | Freq: Every day | CUTANEOUS | Status: DC

## 2022-06-11 MED ORDER — HYDROMORPHONE HCL 1 MG/ML IJ SOLN
0.5000 mg | Freq: Once | INTRAMUSCULAR | Status: AC
  Administered 2022-06-11: 0.5 mg via INTRAVENOUS
  Filled 2022-06-11: qty 0.5

## 2022-06-11 MED ORDER — SODIUM CHLORIDE 0.9 % IV SOLN
62.5000 mg | Freq: Once | INTRAVENOUS | Status: AC
  Administered 2022-06-12: 62.5 mg via INTRAVENOUS
  Filled 2022-06-11: qty 5

## 2022-06-11 MED ORDER — TRANEXAMIC ACID FOR EPISTAXIS
500.0000 mg | Freq: Once | TOPICAL | Status: AC
  Administered 2022-06-11: 500 mg via TOPICAL
  Filled 2022-06-11: qty 10

## 2022-06-11 NOTE — ED Triage Notes (Signed)
Reports migraine and nosebleed that started around 0630 this am.  Reports she is on plavix.

## 2022-06-11 NOTE — ED Notes (Signed)
Rhino rocket removed on entry to room. No bleeding noted at this time. MD notified.

## 2022-06-11 NOTE — Progress Notes (Signed)
Kathy Frank is a 46 Y/O female with ESRD on hemodialysis T,Th,S. She has not missed HD. Says she developed nose bleed this AM. Called HD clinic this AM and was told she couldn't come to unit, to go to hospital. K+ noted to be 7.0 on arrival. Has rec'd lokelma 5 grams, calcium gluconate IV per ED MD. Seen in ED. Sitting on side of stretcher, nose still bleeding. She C/O HA.  Repeated Lokelma 5 grams PO now. Will order HD for tonight when staff is available. Repeat K+ at 1800. She will return to ED post HD and will probably be discharged home. Discussed plan with ED MD and he is in agreement.    HD orders: [facility] T,Th,S 4 hrs 180NRe 400/Autoflow 1.5 74.5 kg 2.0 K/3.0 Ca UFP 2 AVF - No heparin - Mircera 75 mcg IV q 2 weeks (last dose 50 mcg IV 05/21/2022) - Venofer 50 mg IV weekly (due today) - Calcitriol 1.25 mcg PO TIW   ESA dose is overdue. Give ESA and Venofer with HD today.  Juanell Fairly [facility] [phone]

## 2022-06-11 NOTE — ED Provider Notes (Signed)
Emergency Department Provider Note   I have reviewed the triage vital signs and the nursing notes.   HISTORY  Chief Complaint Epistaxis and Migraine   HPI Kathy Frank is a 46 y.o. female with PMH of CAD, DM, HIV, ESRD, HTN presents to the ED with epistaxis and migraine type headache.  Symptoms began this morning.  She was due to have dialysis today but could not go due to persistent oozing from the right nostril.  She last had dialysis on Tuesday and has been compliant with her dialysis sessions otherwise.  No chest pain or shortness of breath.  No abdominal discomfort. She takes ASA and Plavix.   Past Medical History:  Diagnosis Date   Anal abscess    chronic   Anxiety    CAD (coronary artery disease)    CVA (cerebral vascular accident) ([place])    Depression 06/28/2006   Qualifier: Diagnosis of  By: Riccardo Dubin MD, [place]     Diabetes mellitus type 2 in obese ([place]) 06/28/1994   Dyspnea    uses oxygeb 2 liters per minute at dialysis   End stage renal disease on dialysis [facility])    on hemodialysis T/Th/Sat   Erosive esophagitis    Esophageal reflux    Eye redness    Gastroparesis    ? diabetic   GERD (gastroesophageal reflux disease)    History of blood transfusion 2019   Human immunodeficiency virus (HIV) disease ([place]) 04/23/2016   Hyperlipidemia    Hypertension    Metabolic bone disease 59/93/5701   Moderate nonproliferative diabetic retinopathy of both eyes ([place]) 11/21/2014   11/14/14: Noted on retinal imaging; needs follow-up imaging in 6 months  05/22/16: Noted on retinal imaging again; needs follow-up imaging in 6 months   PAD (peripheral artery disease) ([place])    Type 2 diabetes mellitus with diabetic peripheral angiopathy without gangrene ([place]) 05/01/2019   Wears dentures    lower   Wound infection s/p L transmetatarsal amputation     Review of Systems  Constitutional: No fever/chills Eyes: No visual changes. ENT: No sore throat. Positive right epistaxis.   Cardiovascular: Denies chest pain. Respiratory: Denies shortness of breath. Gastrointestinal: No abdominal pain. Neuro: HA.    ____________________________________________   PHYSICAL EXAM:  VITAL SIGNS: ED Triage Vitals  Enc Vitals Group     BP 06/11/22 0911 (!) 179/83     Pulse Rate 06/11/22 0911 89     Resp 06/11/22 0911 18     Temp 06/11/22 0911 97.6 F (36.4 C)     Temp src --      SpO2 06/11/22 0911 99 %     Weight 06/11/22 0923 158 lb (71.7 kg)     Height 06/11/22 0923 5\' 5"  (1.651 m)   Constitutional: Alert and oriented. Well appearing and in no acute distress. Eyes: Conjunctivae are normal. Head: Atraumatic. Nose: No congestion/rhinnorhea. Slow venous oozing from the right nostril. Perforated nasal septum (chronic).  Mouth/Throat: Mucous membranes are moist.   Neck: No stridor.   Cardiovascular: Normal rate, regular rhythm. Good peripheral circulation. Grossly normal heart sounds.   Respiratory: Normal respiratory effort.  No retractions. Lungs CTAB. Gastrointestinal: Soft and nontender. No distention.  Neurologic:  Normal speech and language. No gross focal neurologic deficits are appreciated.  Skin:  Skin is warm, dry and intact. No rash noted.  ____________________________________________   LABS (all labs ordered are listed, but only abnormal results are displayed)  Labs Reviewed  BASIC METABOLIC PANEL - Abnormal; Notable for  the following components:      Result Value   Potassium 7.0 (*)    CO2 17 (*)    Glucose, Bld 130 (*)    BUN 64 (*)    Creatinine, Ser 9.25 (*)    GFR, Estimated 5 (*)    Anion gap 22 (*)    All other components within normal limits  CBC WITH DIFFERENTIAL/PLATELET - Abnormal; Notable for the following components:   RBC 3.06 (*)    Hemoglobin 9.7 (*)    HCT 29.3 (*)    All other components within normal limits  MAGNESIUM - Abnormal; Notable for the following components:   Magnesium 2.5 (*)    All other components within  normal limits  HEPATITIS B SURFACE ANTIBODY, QUANTITATIVE  HEPATITIS B SURFACE ANTIGEN   ____________________________________________  EKG   EKG Interpretation  Date/Time:  Thursday June 11 2022 09:15:17 EDT Ventricular Rate:  89 PR Interval:  161 QRS Duration: 107 QT Interval:  381 QTC Calculation: 464 R Axis:   59 Text Interpretation: Sinus rhythm Low voltage, extremity leads Confirmed by Nanda Quinton [phone]) on 06/11/2022 9:24:30 AM        ____________________________________________   PROCEDURES  Procedure(s) performed:   .Critical Care  Performed by: Margette Fast, MD Authorized by: Margette Fast, MD   Critical care provider statement:    Critical care time (minutes):  35   Critical care time was exclusive of:  Separately billable procedures and treating other patients and teaching time   Critical care was necessary to treat or prevent imminent or life-threatening deterioration of the following conditions:  Metabolic crisis (hyperkalemia)   Critical care was time spent personally by me on the following activities:  Development of treatment plan with patient or surrogate, discussions with consultants, evaluation of patient's response to treatment, examination of patient, ordering and review of laboratory studies, ordering and performing treatments and interventions, pulse oximetry, re-evaluation of patient's condition and review of old charts   I assumed direction of critical care for this patient from another provider in my specialty: no   .Epistaxis Management  Date/Time: 06/11/2022 2:18 PM  Performed by: Margette Fast, MD Authorized by: Margette Fast, MD   Consent:    Consent obtained:  Verbal   Consent given by:  Patient   Risks, benefits, and alternatives were discussed: yes     Risks discussed:  Bleeding   Alternatives discussed:  No treatment Universal protocol:    Patient identity confirmed:  Verbally with patient Anesthesia:    Anesthesia  method:  Topical application   Topical anesthetic:  Epinephrine and lidocaine gel Procedure details:    Treatment site:  R anterior   Treatment method:  Nasal balloon (Rhino rocket 4.5)   Treatment complexity:  Limited   Treatment episode: initial   Post-procedure details:    Assessment:  Bleeding stopped   Procedure completion:  Tolerated well, no immediate complications  ____________________________________________   INITIAL IMPRESSION / ASSESSMENT AND PLAN / ED COURSE  Pertinent labs & imaging results that were available during my care of the patient were reviewed by me and considered in my medical decision making (see chart for details).   This patient is Presenting for Evaluation of epistaxis and missed HD, which does require a range of treatment options, and is a complaint that involves a high risk of morbidity and mortality.  The Differential Diagnoses include hyperkalemia, anemia, anterior epistaxis, HTN, etc.  Critical Interventions-    Medications  oxymetazoline (  AFRIN) 0.05 % nasal spray 1 spray (1 spray Each Nare Given by Other 06/11/22 1049)  tranexamic acid (CYKLOKAPRON) 1000 MG/10ML topical solution 500 mg (500 mg Topical Given by Other 06/11/22 1049)  acetaminophen (TYLENOL) tablet 1,000 mg (1,000 mg Oral Given 06/11/22 1050)  prochlorperazine (COMPAZINE) tablet 5 mg (5 mg Oral Given 06/11/22 1049)  lidocaine-EPINEPHrine (XYLOCAINE W/EPI) 2 %-1:200000 (PF) injection 10 mL (10 mLs Infiltration Given 06/11/22 1150)  fentaNYL (SUBLIMAZE) injection 100 mcg (100 mcg Intramuscular Given 06/11/22 1150)  sodium zirconium cyclosilicate (LOKELMA) packet 5 g (5 g Oral Given 06/11/22 1401)  fentaNYL (SUBLIMAZE) injection 50 mcg (50 mcg Intravenous Given 06/11/22 1513)  calcium gluconate 1 g/ 50 mL sodium chloride IVPB (0 mg Intravenous Stopped 06/11/22 1537)  sodium zirconium cyclosilicate (LOKELMA) packet 5 g (5 g Oral Given 06/11/22 1732)  calcitRIOL (ROCALTROL) capsule 1.25 mcg (1.25  mcg Oral Given 06/11/22 1733)  ferric gluconate (FERRLECIT) 62.5 mg in sodium chloride 0.9 % 100 mL IVPB (0 mg Intravenous Stopped 06/12/22 0501)  calcium gluconate 1 g/ 50 mL sodium chloride IVPB (0 mg Intravenous Stopped 06/11/22 1834)  acetaminophen (TYLENOL) tablet 1,000 mg (1,000 mg Oral Given 06/11/22 1823)  fentaNYL (SUBLIMAZE) injection 25 mcg (25 mcg Intravenous Given 06/11/22 1823)  HYDROmorphone (DILAUDID) injection 0.5 mg (0.5 mg Intravenous Given by Other 06/11/22 2311)  prochlorperazine (COMPAZINE) tablet 5 mg (5 mg Oral Given 06/12/22 0506)  prochlorperazine (COMPAZINE) injection 5 mg (5 mg Intravenous Given 06/12/22 9892)    Reassessment after intervention: Bleeding stopped with rhino rocket.   I decided to review pertinent External Data, and in summary patient with multiple prior ED visits requiring emergent HD.   Clinical Laboratory Tests Ordered, included potassium of 7.0.  No hemolysis.  Magnesium 2.5.  Mild anemia at 9.7 similar to prior values.  Cardiac Monitor Tracing which shows NSR.    Social Determinants of Health Risk patient is a former smoker.   Consult complete with Nephrology. Plan for HD and consideration of d/c from the ED afterwards.   Medical Decision Making: Summary:  Patient presents to the emergency department for evaluation of epistaxis.  She missed her HD appointment this morning due to this.  Fairly low volume bleeding on my assessment that did not respond to TXA or Afrin along with holding pressure.  I do not see a specific area to cauterize.  Bleeding stopped with Rhino Rocket placement.  Patient did miss dialysis this morning and EKG performed in triage shows some peaked T waves.  Potassium of 7.0.  Gave patient oral Lokelma and discussed with nephrology.  No other clear indication for admission.  Plan for HD and likely discharge from the ED afterwards.   Disposition: pending   ____________________________________________  FINAL CLINICAL IMPRESSION(S)  / ED DIAGNOSES  Final diagnoses:  Epistaxis  Hyperkalemia  Migraine without aura and without status migrainosus, not intractable     Note:  This document was prepared using Dragon voice recognition software and may include unintentional dictation errors.  Nanda Quinton, MD, [facility] Emergency Medicine    Raiden Yearwood, Wonda Olds, MD 06/15/22 [phone]

## 2022-06-11 NOTE — ED Provider Notes (Signed)
4:10 PM Assumed care of patient from off-going team. For more details, please see note from same day.  In brief, this is a 46 y.o. female w/ ESRD on HD TuThSat who p/w a trickling nosebleed s/p rhinorocket, now stopped. Consequently did not make it to dialysis this AM. K 7.0 w/ peaked T waves, S/p Ca and lokelma.  Plan/Dispo at time of sign-out & ED Course since sign-out: [ ]  at HD, will likely be brought back down to ED for reevaluation and likely DC  BP 133/66   Pulse 88   Temp 99.5 F (37.5 C) (Oral)   Resp 13   Ht 5\' 5"  (1.651 m)   Wt 71.7 kg   SpO2 100%   BMI 26.29 kg/m    ED Course:   Clinical Course as of 06/22/22 0703  Thu Jun 11, 2022  1708 Patient pulled out her rhinorocket. Currently hemostatic.  [HN]  0938 Has not yet gone to dialysis. Redosed with calcium for cardiac membrane stabilization. [HN]    Clinical Course User Index [HN] Audley Hose, MD   Patient is signed out to the oncoming ED physician who is made aware of her history, presentation, exam, workup, and plan.    ------------------------------- Cindee Lame, MD Emergency Medicine  This note was created using dictation software, which may contain spelling or grammatical errors.   Audley Hose, MD 06/22/22 [phone]

## 2022-06-12 LAB — HEPATITIS B SURFACE ANTIBODY, QUANTITATIVE: Hep B S AB Quant (Post): 72.9 m[IU]/mL (ref >9.9)

## 2022-06-12 MED ORDER — PROCHLORPERAZINE EDISYLATE 10 MG/2ML IJ SOLN
5.0000 mg | Freq: Once | INTRAMUSCULAR | Status: AC
  Administered 2022-06-12: 5 mg via INTRAVENOUS
  Filled 2022-06-12: qty 2

## 2022-06-12 MED ORDER — PROCHLORPERAZINE MALEATE 5 MG PO TABS
5.0000 mg | ORAL_TABLET | Freq: Once | ORAL | Status: AC
  Administered 2022-06-12: 5 mg via ORAL
  Filled 2022-06-12: qty 1

## 2022-06-12 NOTE — Care Management (Addendum)
[facility] transportation called, they referred to DSS transportation. [facility] DSS for assistance with transport home . Time set is tentatively 1100. They will call  this RNCM if they cannot do it for some reason  0915 DSS called  back and stated the patient is not covered by [facility] for transport. Called [facility] and they accepted the patient.

## 2022-06-12 NOTE — ED Provider Notes (Signed)
Patient returned from HD. Patient feels better other than having a headache.  Nosebleed has stopped.  Gave additional compazine for headache.  Cleared for DC.  The patient appears reasonably screened and/or stabilized for discharge and I doubt any other medical condition or other [facility] requiring further screening, evaluation, or treatment in the ED at this time. I have discussed the findings, Dx and Tx plan with the patient/family who expressed understanding and agree(s) with the plan. Discharge instructions discussed at length. The patient/family was given strict return precautions who verbalized understanding of the instructions. No further questions at time of discharge.  Disposition: Discharge  Condition: Good  ED Discharge Orders     None        Follow Up: Primary care provider  Call  as needed       Indie Boehne, Grayce Sessions, MD 06/12/22 [phone]

## 2022-06-12 NOTE — ED Notes (Signed)
RN Case Mgr Colletta [place] arranged with DSS transport via [employer] for patient.  Pick up time is 11:00 a.m.

## 2022-06-12 NOTE — ED Notes (Signed)
DSS has refused transport; RN Case Mgr checking with [facility] for transport.

## 2022-06-12 NOTE — Progress Notes (Signed)
   06/12/22 0415  Vitals  Temp 98.6 F (37 C)  Temp Source Oral  BP (!) 163/63  MAP (mmHg) 86  BP Location Right Arm  BP Method Automatic  Patient Position (if appropriate) Lying  Pulse Rate 92  Pulse Rate Source Monitor  ECG Heart Rate 91  Resp 13  Post Treatment  Dialyzer Clearance Heavily streaked  Duration of HD Treatment -hour(s) 4 hour(s)  Liters Processed 86  Fluid Removed 2800 mL  Tolerated HD Treatment Yes  Post-Hemodialysis Comments Kept siting up unsave and would not lay back. off 25 min.s early.  AVG/AVF Arterial Site Held (minutes) 15 minutes  AVG/AVF Venous Site Held (minutes) 15 minutes   Stopped TX 2 hrs in due to clotting of ven needle do to sudden movement, replaced needle. Stopped TX 25 min.s early due to PT NOT laying back down. Total of 3 hrs and 35 min.s of TX.m

## 2022-06-13 DIAGNOSIS — N2581 Secondary hyperparathyroidism of renal origin: Secondary | ICD-10-CM | POA: Diagnosis not present

## 2022-06-13 DIAGNOSIS — N186 End stage renal disease: Secondary | ICD-10-CM | POA: Diagnosis not present

## 2022-06-13 DIAGNOSIS — L299 Pruritus, unspecified: Secondary | ICD-10-CM | POA: Diagnosis not present

## 2022-06-13 DIAGNOSIS — I120 Hypertensive chronic kidney disease with stage 5 chronic kidney disease or end stage renal disease: Secondary | ICD-10-CM | POA: Diagnosis not present

## 2022-06-13 DIAGNOSIS — D631 Anemia in chronic kidney disease: Secondary | ICD-10-CM | POA: Diagnosis not present

## 2022-06-13 DIAGNOSIS — R519 Headache, unspecified: Secondary | ICD-10-CM | POA: Diagnosis not present

## 2022-06-13 DIAGNOSIS — Z992 Dependence on renal dialysis: Secondary | ICD-10-CM | POA: Diagnosis not present

## 2022-06-15 ENCOUNTER — Telehealth: Payer: Self-pay

## 2022-06-15 NOTE — Patient Outreach (Signed)
  Care Coordination TOC Note Transition Care Management Unsuccessful Follow-up Telephone Call  Date of discharge and from where:  [facility] 06/11/22  Attempts:  1st Attempt  Reason for unsuccessful TCM follow-up call:  Unable to leave message-voicemail has not been set up.  Johnney Killian, RN, BSN, CCM Care Management Coordinator [facility] Phone: [phone]: [phone]

## 2022-06-15 NOTE — Patient Outreach (Signed)
  Care Coordination TOC Note Transition Care Management Unsuccessful Follow-up Telephone Call  Date of discharge and from where:  [facility] 06/11/22  Attempts:  2nd Attempt  Reason for unsuccessful TCM follow-up call:  Unable to leave message-voicemail not set up.  Johnney Killian, RN, BSN, CCM Care Management Coordinator [facility] Phone: [phone]: [phone]

## 2022-06-16 DIAGNOSIS — N2581 Secondary hyperparathyroidism of renal origin: Secondary | ICD-10-CM | POA: Diagnosis not present

## 2022-06-16 DIAGNOSIS — I120 Hypertensive chronic kidney disease with stage 5 chronic kidney disease or end stage renal disease: Secondary | ICD-10-CM | POA: Diagnosis not present

## 2022-06-16 DIAGNOSIS — L299 Pruritus, unspecified: Secondary | ICD-10-CM | POA: Diagnosis not present

## 2022-06-16 DIAGNOSIS — R519 Headache, unspecified: Secondary | ICD-10-CM | POA: Diagnosis not present

## 2022-06-16 DIAGNOSIS — N186 End stage renal disease: Secondary | ICD-10-CM | POA: Diagnosis not present

## 2022-06-16 DIAGNOSIS — Z992 Dependence on renal dialysis: Secondary | ICD-10-CM | POA: Diagnosis not present

## 2022-06-16 DIAGNOSIS — D631 Anemia in chronic kidney disease: Secondary | ICD-10-CM | POA: Diagnosis not present

## 2022-06-18 DIAGNOSIS — N186 End stage renal disease: Secondary | ICD-10-CM | POA: Diagnosis not present

## 2022-06-18 DIAGNOSIS — R519 Headache, unspecified: Secondary | ICD-10-CM | POA: Diagnosis not present

## 2022-06-18 DIAGNOSIS — Z992 Dependence on renal dialysis: Secondary | ICD-10-CM | POA: Diagnosis not present

## 2022-06-18 DIAGNOSIS — N2581 Secondary hyperparathyroidism of renal origin: Secondary | ICD-10-CM | POA: Diagnosis not present

## 2022-06-18 DIAGNOSIS — L299 Pruritus, unspecified: Secondary | ICD-10-CM | POA: Diagnosis not present

## 2022-06-18 DIAGNOSIS — D631 Anemia in chronic kidney disease: Secondary | ICD-10-CM | POA: Diagnosis not present

## 2022-06-18 DIAGNOSIS — I120 Hypertensive chronic kidney disease with stage 5 chronic kidney disease or end stage renal disease: Secondary | ICD-10-CM | POA: Diagnosis not present

## 2022-06-20 DIAGNOSIS — I120 Hypertensive chronic kidney disease with stage 5 chronic kidney disease or end stage renal disease: Secondary | ICD-10-CM | POA: Diagnosis not present

## 2022-06-20 DIAGNOSIS — L299 Pruritus, unspecified: Secondary | ICD-10-CM | POA: Diagnosis not present

## 2022-06-20 DIAGNOSIS — N2581 Secondary hyperparathyroidism of renal origin: Secondary | ICD-10-CM | POA: Diagnosis not present

## 2022-06-20 DIAGNOSIS — Z992 Dependence on renal dialysis: Secondary | ICD-10-CM | POA: Diagnosis not present

## 2022-06-20 DIAGNOSIS — R519 Headache, unspecified: Secondary | ICD-10-CM | POA: Diagnosis not present

## 2022-06-20 DIAGNOSIS — D631 Anemia in chronic kidney disease: Secondary | ICD-10-CM | POA: Diagnosis not present

## 2022-06-20 DIAGNOSIS — N186 End stage renal disease: Secondary | ICD-10-CM | POA: Diagnosis not present

## 2022-06-23 DIAGNOSIS — N2581 Secondary hyperparathyroidism of renal origin: Secondary | ICD-10-CM | POA: Diagnosis not present

## 2022-06-23 DIAGNOSIS — D631 Anemia in chronic kidney disease: Secondary | ICD-10-CM | POA: Diagnosis not present

## 2022-06-23 DIAGNOSIS — N186 End stage renal disease: Secondary | ICD-10-CM | POA: Diagnosis not present

## 2022-06-23 DIAGNOSIS — L299 Pruritus, unspecified: Secondary | ICD-10-CM | POA: Diagnosis not present

## 2022-06-23 DIAGNOSIS — R519 Headache, unspecified: Secondary | ICD-10-CM | POA: Diagnosis not present

## 2022-06-23 DIAGNOSIS — Z992 Dependence on renal dialysis: Secondary | ICD-10-CM | POA: Diagnosis not present

## 2022-06-23 DIAGNOSIS — I120 Hypertensive chronic kidney disease with stage 5 chronic kidney disease or end stage renal disease: Secondary | ICD-10-CM | POA: Diagnosis not present

## 2022-06-25 ENCOUNTER — Ambulatory Visit (HOSPITAL_COMMUNITY)
Admission: RE | Admit: 2022-06-25 | Discharge: 2022-06-25 | Disposition: A | Discharge status: Home / Self Care | Payer: Medicare Other | Source: Ambulatory Visit | Attending: Internal Medicine | Admitting: Internal Medicine

## 2022-06-25 ENCOUNTER — Telehealth: Payer: Self-pay | Admitting: *Deleted

## 2022-06-25 ENCOUNTER — Other Ambulatory Visit: Payer: Self-pay

## 2022-06-25 ENCOUNTER — Other Ambulatory Visit (HOSPITAL_COMMUNITY): Payer: Self-pay

## 2022-06-25 ENCOUNTER — Encounter: Payer: Self-pay | Admitting: Student

## 2022-06-25 ENCOUNTER — Ambulatory Visit (HOSPITAL_COMMUNITY): Payer: Medicare Other

## 2022-06-25 ENCOUNTER — Ambulatory Visit (INDEPENDENT_AMBULATORY_CARE_PROVIDER_SITE_OTHER): Payer: Medicare Other | Admitting: Student

## 2022-06-25 VITALS — BP 118/60 | HR 95 | Temp 101.3°F | Resp 32 | Ht 65.0 in | Wt 163.8 lb

## 2022-06-25 DIAGNOSIS — R350 Frequency of micturition: Secondary | ICD-10-CM | POA: Diagnosis not present

## 2022-06-25 DIAGNOSIS — L299 Pruritus, unspecified: Secondary | ICD-10-CM | POA: Diagnosis not present

## 2022-06-25 DIAGNOSIS — D631 Anemia in chronic kidney disease: Secondary | ICD-10-CM | POA: Diagnosis not present

## 2022-06-25 DIAGNOSIS — N186 End stage renal disease: Secondary | ICD-10-CM | POA: Diagnosis not present

## 2022-06-25 DIAGNOSIS — R519 Headache, unspecified: Secondary | ICD-10-CM | POA: Diagnosis not present

## 2022-06-25 DIAGNOSIS — J189 Pneumonia, unspecified organism: Secondary | ICD-10-CM | POA: Diagnosis not present

## 2022-06-25 DIAGNOSIS — R079 Chest pain, unspecified: Secondary | ICD-10-CM | POA: Diagnosis not present

## 2022-06-25 DIAGNOSIS — R0989 Other specified symptoms and signs involving the circulatory and respiratory systems: Secondary | ICD-10-CM | POA: Diagnosis not present

## 2022-06-25 DIAGNOSIS — R0781 Pleurodynia: Secondary | ICD-10-CM | POA: Diagnosis not present

## 2022-06-25 DIAGNOSIS — R0682 Tachypnea, not elsewhere classified: Secondary | ICD-10-CM | POA: Diagnosis not present

## 2022-06-25 DIAGNOSIS — R509 Fever, unspecified: Secondary | ICD-10-CM | POA: Insufficient documentation

## 2022-06-25 DIAGNOSIS — N2581 Secondary hyperparathyroidism of renal origin: Secondary | ICD-10-CM | POA: Diagnosis not present

## 2022-06-25 DIAGNOSIS — Z992 Dependence on renal dialysis: Secondary | ICD-10-CM | POA: Diagnosis not present

## 2022-06-25 DIAGNOSIS — I120 Hypertensive chronic kidney disease with stage 5 chronic kidney disease or end stage renal disease: Secondary | ICD-10-CM | POA: Diagnosis not present

## 2022-06-25 HISTORY — DX: Pneumonia, unspecified organism: J18.9

## 2022-06-25 LAB — CBC WITH DIFFERENTIAL/PLATELET
Abs Immature Granulocytes: 0.02 10*3/uL (ref 0.00–0.07)
Basophils Absolute: 0.1 10*3/uL (ref 0.0–0.1)
Basophils Relative: 1 %
Eosinophils Absolute: 0.1 10*3/uL (ref 0.0–0.5)
Eosinophils Relative: 1 %
HCT: 25.9 % — ABNORMAL LOW (ref 36.0–46.0)
Hemoglobin: 8.5 g/dL — ABNORMAL LOW (ref 12.0–15.0)
Immature Granulocytes: 0 %
Lymphocytes Relative: 11 %
Lymphs Abs: 0.8 10*3/uL (ref 0.7–4.0)
MCH: 31.7 pg (ref 26.0–34.0)
MCHC: 32.8 g/dL (ref 30.0–36.0)
MCV: 96.6 fL (ref 80.0–100.0)
Monocytes Absolute: 0.7 10*3/uL (ref 0.1–1.0)
Monocytes Relative: 9 %
Neutro Abs: 6.3 10*3/uL (ref 1.7–7.7)
Neutrophils Relative %: 78 %
Platelets: 271 10*3/uL (ref 150–400)
RBC: 2.68 MIL/uL — ABNORMAL LOW (ref 3.87–5.11)
RDW: 15.7 % — ABNORMAL HIGH (ref 11.5–15.5)
WBC: 8 10*3/uL (ref 4.0–10.5)
nRBC: 0 % (ref 0.0–0.2)

## 2022-06-25 NOTE — Telephone Encounter (Addendum)
Call from patient currently  at Dialysis.  C/O of chest pain when she inhales in the middle of her chest.  Has had for a couple of days.  Has had some issues with increased fluids before.  Has some shortness of breath at times.  Given an appointment with  Dr. Lisabeth Devoid for this afternoon .

## 2022-06-25 NOTE — Progress Notes (Signed)
CC: evaluation of pleuritic chest pain, fever, chills  HPI:  [name] is a 46 y.o. Female with history listed below presenting to the [facility] for evaluation of pleuritic chest pain, fevers, chills. Please see individualized problem based charting for full HPI.  Past Medical History:  Diagnosis Date   Anal abscess    chronic   Anxiety    CAD (coronary artery disease)    CVA (cerebral vascular accident) ([place])    Depression 06/28/2006   Qualifier: Diagnosis of  By: Riccardo Dubin MD, [place]     Diabetes mellitus type 2 in obese ([place]) 06/28/1994   Dyspnea    uses oxygeb 2 liters per minute at dialysis   End stage renal disease on dialysis [facility])    on hemodialysis T/Th/Sat   Erosive esophagitis    Esophageal reflux    Eye redness    Gastroparesis    ? diabetic   GERD (gastroesophageal reflux disease)    History of blood transfusion 2019   Human immunodeficiency virus (HIV) disease ([place]) 04/23/2016   Hyperlipidemia    Hypertension    Metabolic bone disease 57/84/6962   Moderate nonproliferative diabetic retinopathy of both eyes ([place]) 11/21/2014   11/14/14: Noted on retinal imaging; needs follow-up imaging in 6 months  05/22/16: Noted on retinal imaging again; needs follow-up imaging in 6 months   PAD (peripheral artery disease) ([place])    Type 2 diabetes mellitus with diabetic peripheral angiopathy without gangrene ([place]) 05/01/2019   Wears dentures    lower   Wound infection s/p L transmetatarsal amputation     Review of Systems:  Negative aside from that listed in individualized problem based charting.  Physical Exam:  Vitals:   06/25/22 1513  BP: 118/60  Pulse: 95  Resp: (!) 32  Temp: (!) 101.3 F (38.5 C)  TempSrc: Oral  SpO2: 98%  Weight: 163 lb 12.8 oz (74.3 kg)  Height: 5\' 5"  (1.651 m)   Physical Exam Constitutional:      Appearance: She is well-developed.     Comments: Chronically ill-appearing  HENT:     Mouth/Throat:     Mouth: Mucous membranes are  moist.     Pharynx: Oropharynx is clear.  Cardiovascular:     Rate and Rhythm: Normal rate and regular rhythm.     Heart sounds: Normal heart sounds.  Pulmonary:     Effort: Tachypnea present. No accessory muscle usage or respiratory distress.     Comments: RLL crackles on exam. No wheezing or rhonchi noted.  Abdominal:     General: Bowel sounds are normal.     Palpations: Abdomen is soft.     Tenderness: There is no abdominal tenderness. There is no guarding or rebound.  Musculoskeletal:     Cervical back: Normal range of motion.     Right lower leg: No edema.     Left lower leg: No edema.     Comments: R BKA and L AKA  Skin:    General: Skin is warm and dry.     Findings: No erythema or rash.     Comments: Left AVF without surrounding erythema, discharge, warmth.   Neurological:     Mental Status: She is alert and oriented to person, place, and time.  Psychiatric:        Mood and Affect: Mood normal.        Behavior: Behavior normal.      Assessment & Plan:   See Encounters Tab for problem based charting.  Patient discussed with Dr. Philipp Ovens

## 2022-06-25 NOTE — Assessment & Plan Note (Addendum)
Patient presenting with 3-day history of pleuritic chest pain. She reports that she has been having pain at the center of her chest with deep inspiration. She also reports subjective fevers and chills during this time. She denies any headaches, congestion, cough, N/V, abdominal pain, diarrhea, constipation, burning with urination. She does endorse increased urinary frequency, stating that she has been urinating about 3-4 times a day which is unusual for her. When she began developing pleuritic chest pain, she initially thought it was due to volume overload and thus she went to dialysis where they removed fluid until she returned to her dry weight. However, she continued to experience pleuritic chest pain which prompted her to schedule this visit. Of note, patient has HIV but it is very well controlled (undetectable viral load at last check) and thus low suspicion for opportunistic infection.  Her vitals are notable for a fever of 101.93F and tachypnea. She does have RLL crackles on exam. Her L AVF has a good palpable thrill and is without surrounding erythema, discharge, warmth, tenderness. Skin exam without any notable changes or open wounds.   Given pleuritic chest pain, fever, subjective chills, and RLL crackles, I am concerned for possible CAP. Her urinary frequency is concerning for possible UTI.   EKG was performed in clinic with no obvious ischemic changes noted. We will a CBC with differential, blood cultures x2, and CXR. Unable to collect urine for UA and urine culture as patient was unable to void. If no obvious infection noted on CXR, will need to consider empiric treatment of UTI. If pneumonia noted, consider cefdinir and azithromycin for CAP coverage. Provided return precautions to go to ED should she develop worsening pleuritic chest pain, SHOB, fevers.  Plan: -f/u CBC with diff, blood cultures x2 -f/u CXR -unable to obtain urine for UA and urine culture -appropriate antibiotics pending  workup  ADDENDUM: Patient's CBC without leukocytosis and has mild down-trend in Hgb (suspect possibly from chronic renal disease and not acute blood loss). Blood cultures without growth over past 24 hours. CXR showing new bilateral lower lung linear densities, concerning for atelectasis vs pneumonia, along with a trace R pleural effusion. Given that she is very high risk for infection, will empirically treat with a 5-day course of Augmentin (renally dosed). Will have her take it once daily at night so that it is consistently after dialysis on HD days. This should effectively treat CAP and possible UTI (given frequency but inability to collect urine for analysis).   -Augmentin 500-125mg  every night for 5 days -f/u in 1 week to ensure symptom improvement

## 2022-06-25 NOTE — Patient Instructions (Signed)
Kathy Frank,  It was a pleasure seeing you in the clinic today.   It appears that you have an infection. We got some labs from you along with an EKG and a chest x-ray to help [place] figure out what is causing your infection. Once I have all of this information (likely by tomorrow morning), I will give you a call to go over them. I do think that you may need antibiotics, but I would like to get more information to know what we are treating first. If your shortness of breath and chest pain become worse, please go to the ED for more urgent evaluation.  Please call our clinic at [phone] if you have any questions or concerns. The best time to call is Monday-Friday from 9am-4pm, but there is someone available 24/7 at the same number. If you need medication refills, please notify your pharmacy one week in advance and they will send [place] a request.   Thank you for letting us take part in your care. We look forward to seeing you next time!

## 2022-06-26 ENCOUNTER — Telehealth: Payer: Self-pay | Admitting: *Deleted

## 2022-06-26 MED ORDER — AMOXICILLIN-POT CLAVULANATE 500-125 MG PO TABS: 1.0000 | ORAL_TABLET | Freq: Every day | ORAL | 0 refills | Status: DC

## 2022-06-26 NOTE — Addendum Note (Signed)
Addended by: Virl Axe on: 06/26/2022 09:07 AM   Modules accepted: Orders

## 2022-06-26 NOTE — Telephone Encounter (Signed)
Patient called in stating she is using albuterol HFA q 4 hours but doesn't seem like it's working. Thinks it may be 2/2 pneumonia. She is unable to increase water intake 2/2 kidney failure. Patient states she is not on a controller inhaler. Please advise.

## 2022-06-27 ENCOUNTER — Other Ambulatory Visit: Payer: Self-pay

## 2022-06-27 ENCOUNTER — Encounter (HOSPITAL_COMMUNITY): Payer: Self-pay

## 2022-06-27 ENCOUNTER — Observation Stay (HOSPITAL_COMMUNITY)
Admission: EM | Admit: 2022-06-27 | Discharge: 2022-06-30 | Disposition: A | Discharge status: Home / Self Care | Payer: Medicare Other | Attending: Internal Medicine | Admitting: Internal Medicine

## 2022-06-27 DIAGNOSIS — D649 Anemia, unspecified: Secondary | ICD-10-CM | POA: Insufficient documentation

## 2022-06-27 DIAGNOSIS — Z794 Long term (current) use of insulin: Secondary | ICD-10-CM | POA: Insufficient documentation

## 2022-06-27 DIAGNOSIS — Z992 Dependence on renal dialysis: Secondary | ICD-10-CM | POA: Insufficient documentation

## 2022-06-27 DIAGNOSIS — E1122 Type 2 diabetes mellitus with diabetic chronic kidney disease: Secondary | ICD-10-CM | POA: Diagnosis not present

## 2022-06-27 DIAGNOSIS — Z955 Presence of coronary angioplasty implant and graft: Secondary | ICD-10-CM | POA: Insufficient documentation

## 2022-06-27 DIAGNOSIS — B20 Human immunodeficiency virus [HIV] disease: Secondary | ICD-10-CM | POA: Diagnosis not present

## 2022-06-27 DIAGNOSIS — Z89612 Acquired absence of left leg above knee: Secondary | ICD-10-CM | POA: Diagnosis not present

## 2022-06-27 DIAGNOSIS — D631 Anemia in chronic kidney disease: Secondary | ICD-10-CM | POA: Insufficient documentation

## 2022-06-27 DIAGNOSIS — G43109 Migraine with aura, not intractable, without status migrainosus: Secondary | ICD-10-CM | POA: Diagnosis not present

## 2022-06-27 DIAGNOSIS — E1169 Type 2 diabetes mellitus with other specified complication: Secondary | ICD-10-CM | POA: Insufficient documentation

## 2022-06-27 DIAGNOSIS — I2510 Atherosclerotic heart disease of native coronary artery without angina pectoris: Secondary | ICD-10-CM | POA: Insufficient documentation

## 2022-06-27 DIAGNOSIS — Z89611 Acquired absence of right leg above knee: Secondary | ICD-10-CM | POA: Insufficient documentation

## 2022-06-27 DIAGNOSIS — Z7902 Long term (current) use of antithrombotics/antiplatelets: Secondary | ICD-10-CM | POA: Diagnosis not present

## 2022-06-27 DIAGNOSIS — K5900 Constipation, unspecified: Secondary | ICD-10-CM | POA: Insufficient documentation

## 2022-06-27 DIAGNOSIS — E669 Obesity, unspecified: Secondary | ICD-10-CM | POA: Diagnosis not present

## 2022-06-27 DIAGNOSIS — Z79899 Other long term (current) drug therapy: Secondary | ICD-10-CM | POA: Insufficient documentation

## 2022-06-27 DIAGNOSIS — E211 Secondary hyperparathyroidism, not elsewhere classified: Secondary | ICD-10-CM | POA: Diagnosis not present

## 2022-06-27 DIAGNOSIS — I10 Essential (primary) hypertension: Secondary | ICD-10-CM | POA: Diagnosis present

## 2022-06-27 DIAGNOSIS — Z8673 Personal history of transient ischemic attack (TIA), and cerebral infarction without residual deficits: Secondary | ICD-10-CM | POA: Diagnosis not present

## 2022-06-27 DIAGNOSIS — I120 Hypertensive chronic kidney disease with stage 5 chronic kidney disease or end stage renal disease: Secondary | ICD-10-CM | POA: Diagnosis not present

## 2022-06-27 DIAGNOSIS — R040 Epistaxis: Secondary | ICD-10-CM | POA: Diagnosis not present

## 2022-06-27 DIAGNOSIS — Z6827 Body mass index (BMI) 27.0-27.9, adult: Secondary | ICD-10-CM | POA: Diagnosis not present

## 2022-06-27 DIAGNOSIS — J189 Pneumonia, unspecified organism: Secondary | ICD-10-CM | POA: Diagnosis not present

## 2022-06-27 DIAGNOSIS — E119 Type 2 diabetes mellitus without complications: Secondary | ICD-10-CM | POA: Diagnosis present

## 2022-06-27 DIAGNOSIS — N186 End stage renal disease: Secondary | ICD-10-CM | POA: Diagnosis not present

## 2022-06-27 LAB — CBC WITH DIFFERENTIAL/PLATELET
Abs Immature Granulocytes: 0.03 10*3/uL (ref 0.00–0.07)
Basophils Absolute: 0.1 10*3/uL (ref 0.0–0.1)
Basophils Relative: 1 %
Eosinophils Absolute: 0.2 10*3/uL (ref 0.0–0.5)
Eosinophils Relative: 2 %
HCT: 21 % — ABNORMAL LOW (ref 36.0–46.0)
Hemoglobin: 7 g/dL — ABNORMAL LOW (ref 12.0–15.0)
Immature Granulocytes: 0 %
Lymphocytes Relative: 14 %
Lymphs Abs: 1.1 10*3/uL (ref 0.7–4.0)
MCH: 32.7 pg (ref 26.0–34.0)
MCHC: 33.3 g/dL (ref 30.0–36.0)
MCV: 98.1 fL (ref 80.0–100.0)
Monocytes Absolute: 0.8 10*3/uL (ref 0.1–1.0)
Monocytes Relative: 10 %
Neutro Abs: 5.6 10*3/uL (ref 1.7–7.7)
Neutrophils Relative %: 73 %
Platelets: 323 10*3/uL (ref 150–400)
RBC: 2.14 MIL/uL — ABNORMAL LOW (ref 3.87–5.11)
RDW: 16 % — ABNORMAL HIGH (ref 11.5–15.5)
WBC: 7.8 10*3/uL (ref 4.0–10.5)
nRBC: 0 % (ref 0.0–0.2)

## 2022-06-27 LAB — BASIC METABOLIC PANEL
Anion gap: 27 — ABNORMAL HIGH (ref 5–15)
BUN: 71 mg/dL — ABNORMAL HIGH (ref 6–20)
CO2: 21 mmol/L — ABNORMAL LOW (ref 22–32)
Calcium: 7.7 mg/dL — ABNORMAL LOW (ref 8.9–10.3)
Chloride: 89 mmol/L — ABNORMAL LOW (ref 98–111)
Creatinine, Ser: 8.96 mg/dL — ABNORMAL HIGH (ref 0.44–1.00)
GFR, Estimated: 5 mL/min — ABNORMAL LOW (ref >60)
Glucose, Bld: 141 mg/dL — ABNORMAL HIGH (ref 70–99)
Potassium: 5 mmol/L (ref 3.5–5.1)
Sodium: 137 mmol/L (ref 135–145)

## 2022-06-27 LAB — CBC
HCT: 20.5 % — ABNORMAL LOW (ref 36.0–46.0)
Hemoglobin: 6.7 g/dL — CL (ref 12.0–15.0)
MCH: 31.2 pg (ref 26.0–34.0)
MCHC: 32.7 g/dL (ref 30.0–36.0)
MCV: 95.3 fL (ref 80.0–100.0)
Platelets: 353 10*3/uL (ref 150–400)
RBC: 2.15 MIL/uL — ABNORMAL LOW (ref 3.87–5.11)
RDW: 16 % — ABNORMAL HIGH (ref 11.5–15.5)
WBC: 8.5 10*3/uL (ref 4.0–10.5)
nRBC: 0 % (ref 0.0–0.2)

## 2022-06-27 LAB — PREPARE RBC (CROSSMATCH)

## 2022-06-27 LAB — PROTIME-INR
INR: 1.2 (ref 0.8–1.2)
Prothrombin Time: 14.7 seconds (ref 11.4–15.2)

## 2022-06-27 LAB — GLUCOSE, CAPILLARY: Glucose-Capillary: 350 mg/dL — ABNORMAL HIGH (ref 70–99)

## 2022-06-27 LAB — APTT: aPTT: 29 seconds (ref 24–36)

## 2022-06-27 LAB — ALBUMIN: Albumin: 2.7 g/dL — ABNORMAL LOW (ref 3.5–5.0)

## 2022-06-27 LAB — PHOSPHORUS: Phosphorus: 8 mg/dL — ABNORMAL HIGH (ref 2.5–4.6)

## 2022-06-27 MED ORDER — SODIUM CHLORIDE 0.9 % IV SOLN: 10.0000 mL/h | Freq: Once | INTRAVENOUS | Status: DC

## 2022-06-27 MED ORDER — FENTANYL CITRATE PF 50 MCG/ML IJ SOSY
50.0000 ug | PREFILLED_SYRINGE | Freq: Once | INTRAMUSCULAR | Status: AC
  Administered 2022-06-27: 50 ug via INTRAMUSCULAR
  Filled 2022-06-27 (×2): qty 1

## 2022-06-27 MED ORDER — CHLORHEXIDINE GLUCONATE CLOTH 2 % EX PADS
6.0000 | MEDICATED_PAD | Freq: Every day | CUTANEOUS | Status: DC
  Administered 2022-06-28 – 2022-06-30 (×3): 6 via TOPICAL

## 2022-06-27 MED ORDER — BICTEGRAVIR-EMTRICITAB-TENOFOV 50-200-25 MG PO TABS
1.0000 | ORAL_TABLET | Freq: Every day | ORAL | Status: DC
  Administered 2022-06-28 – 2022-06-30 (×3): 1 via ORAL
  Filled 2022-06-27 (×4): qty 1

## 2022-06-27 MED ORDER — MELATONIN 3 MG PO TABS
3.0000 mg | ORAL_TABLET | Freq: Every day | ORAL | Status: DC
  Administered 2022-06-28 – 2022-06-29 (×3): 3 mg via ORAL
  Filled 2022-06-27 (×3): qty 1

## 2022-06-27 MED ORDER — AMLODIPINE BESYLATE 10 MG PO TABS
10.0000 mg | ORAL_TABLET | Freq: Every day | ORAL | Status: DC
  Administered 2022-06-27 – 2022-06-30 (×4): 10 mg via ORAL
  Filled 2022-06-27 (×4): qty 1

## 2022-06-27 MED ORDER — SODIUM CHLORIDE 0.9 % IV BOLUS: 1000.0000 mL | Freq: Once | INTRAVENOUS | Status: DC

## 2022-06-27 MED ORDER — ACETAMINOPHEN 325 MG PO TABS: 650.0000 mg | ORAL_TABLET | Freq: Four times a day (QID) | ORAL | Status: DC | PRN

## 2022-06-27 MED ORDER — CALCITRIOL 0.5 MCG PO CAPS
1.2500 ug | ORAL_CAPSULE | ORAL | Status: DC
  Administered 2022-06-30: 1.25 ug via ORAL
  Filled 2022-06-27: qty 1

## 2022-06-27 MED ORDER — INSULIN ASPART 100 UNIT/ML IJ SOLN
0.0000 [IU] | Freq: Every day | INTRAMUSCULAR | Status: DC
  Administered 2022-06-27 – 2022-06-29 (×2): 4 [IU] via SUBCUTANEOUS

## 2022-06-27 MED ORDER — ACETAMINOPHEN 500 MG PO TABS
1000.0000 mg | ORAL_TABLET | Freq: Once | ORAL | Status: AC
  Administered 2022-06-27: 1000 mg via ORAL
  Filled 2022-06-27: qty 2

## 2022-06-27 MED ORDER — SODIUM CHLORIDE 0.9 % IV BOLUS: 500.0000 mL | Freq: Once | INTRAVENOUS | Status: DC

## 2022-06-27 MED ORDER — SODIUM CHLORIDE 0.9 % IV BOLUS
500.0000 mL | Freq: Once | INTRAVENOUS | Status: AC
  Administered 2022-06-27: 500 mL via INTRAVENOUS

## 2022-06-27 MED ORDER — OXYMETAZOLINE HCL 0.05 % NA SOLN
1.0000 | Freq: Once | NASAL | Status: AC
  Administered 2022-06-27: 1 via NASAL
  Filled 2022-06-27: qty 30

## 2022-06-27 MED ORDER — INSULIN ASPART 100 UNIT/ML IJ SOLN
0.0000 [IU] | Freq: Three times a day (TID) | INTRAMUSCULAR | Status: DC
  Administered 2022-06-28: 3 [IU] via SUBCUTANEOUS
  Administered 2022-06-28: 5 [IU] via SUBCUTANEOUS
  Administered 2022-06-29: 1 [IU] via SUBCUTANEOUS
  Administered 2022-06-29: 3 [IU] via SUBCUTANEOUS
  Administered 2022-06-29: 2 [IU] via SUBCUTANEOUS
  Administered 2022-06-30: 3 [IU] via SUBCUTANEOUS
  Administered 2022-06-30: 1 [IU] via SUBCUTANEOUS

## 2022-06-27 MED ORDER — AMOXICILLIN-POT CLAVULANATE 500-125 MG PO TABS
1.0000 | ORAL_TABLET | Freq: Every day | ORAL | Status: DC
  Administered 2022-06-27 – 2022-06-29 (×3): 1 via ORAL
  Filled 2022-06-27 (×6): qty 1

## 2022-06-27 NOTE — Progress Notes (Signed)
Patient arrived to room 6N08 at 8:32 pm from ED.

## 2022-06-27 NOTE — ED Provider Notes (Signed)
[facility] EMERGENCY DEPARTMENT Provider Note   CSN: [id number] Arrival date & time: 06/27/22  5284     History  Chief Complaint  Patient presents with   Epistaxis    Kathy Frank is a 46 y.o. female. Patient with past medical history of end-stage renal disease on dialysis, HIV, hypertension, diabetes type 2, hyperlipidemia, status post bilateral AKA, peripheral artery disease, coronary artery disease, on Plavix   Epistaxis  Presenting with bilateral nosebleed since 1 PM yesterday afternoon.  She says that she can get it to stop for just a little bit, but then she has blood clots and she ends up needing to blows her nose and it starts going again.  She says she was recently seen in the hospital and had anemia so she was really concerned about losing too much blood.  She has been started dialysis today, but they said that she had to come here instead to get her nosebleeds taken care of.  She was seen for a nosebleed about 2 weeks ago just the right side, but she feels that this 1 was worse.  So the only thing that helped her with the nasal tampons.    Home Medications Prior to Admission medications   Medication Sig Start Date End Date Taking? Authorizing Provider  Accu-Chek Softclix Lancets lancets Use as instructed 05/02/21   Atway, Rayann N, DO  acetaminophen (TYLENOL) 500 MG tablet Take 1,000 mg by mouth every 6 (six) hours as needed for moderate pain.    [provider]  albuterol (VENTOLIN HFA) 108 (90 Base) MCG/ACT inhaler Inhale 2 puffs into the lungs every 4 (four) hours as needed for wheezing or shortness of breath. 02/14/21   Angiulli, Mcarthur Rossetti, PA-C  amLODipine (NORVASC) 10 MG tablet Take 1 tablet (10 mg total) by mouth daily. 05/15/21   Quincy Simmonds, MD  amoxicillin-clavulanate (AUGMENTIN) 500-125 MG tablet Take 1 tablet by mouth at bedtime. 06/26/22   Merrilyn Puma, MD  atorvastatin (LIPITOR) 80 MG tablet Take 1 tablet (80 mg total) by mouth  daily. 05/24/21   Masters, Katie, DO  bictegravir-emtricitabine-tenofovir AF (BIKTARVY) 50-200-25 MG TABS tablet Take 1 tablet by mouth daily. OVERDUE FOR OFFICE FOLLOW UP - PLEASE CALL OUR OFFICE TO SCHEDULE [phone] Patient taking differently: Take 1 tablet by mouth daily. 06/08/22   Cliffton Asters, MD  calcitRIOL (ROCALTROL) 0.25 MCG capsule Take 1 capsule (0.25 mcg total) by mouth Every Tuesday,Thursday,and Saturday with dialysis. 04/01/21   Lars Mage, PA-C  calcium acetate (PHOSLO) 667 MG capsule Take 3 capsules (2,001 mg total) by mouth 3 (three) times daily with meals. 02/14/21   Angiulli, Mcarthur Rossetti, PA-C  clopidogrel (PLAVIX) 75 MG tablet Take 1 tablet by mouth once daily 02/06/22   Maeola Harman, MD  Continuous Blood Gluc Receiver (DEXCOM G7 RECEIVER) DEVI Use to check blood sugars cntinuously 12/09/21   Quincy Simmonds, MD  Continuous Blood Gluc Sensor (DEXCOM G7 SENSOR) MISC Use to check blood sugar continuously 12/09/21   Quincy Simmonds, MD  cyclobenzaprine (FLEXERIL) 5 MG tablet Take 5 mg by mouth 2 (two) times daily as needed for muscle spasms. 05/21/22   [provider]  EQ ASPIRIN ADULT LOW DOSE 81 MG tablet TAKE 1 TABLET BY MOUTH ONCE DAILY. SWALLOW WHOLE. Patient taking differently: Take 81 mg by mouth daily. 02/06/22   Maeola Harman, MD  glucose blood test strip Use as instructed 05/02/21   Atway, Rayann N, DO  hydrALAZINE (APRESOLINE) 50 MG tablet  Take 1 tablet by mouth 4 times daily 03/02/22   Quincy Simmonds, MD  insulin glargine (LANTUS SOLOSTAR) 100 UNIT/ML Solostar Pen Inject 8 Units into the skin at bedtime. Patient taking differently: Inject 15 Units into the skin at bedtime. 02/06/22 04/05/28  Quincy Simmonds, MD  Insulin Pen Needle (PENTIPS) 32G X 4 MM MISC Use as directed 02/14/21   Ranelle Oyster, MD  liraglutide (VICTOZA) 18 MG/3ML SOPN Inject 0.6 mg into the skin daily. 02/06/22 02/01/23  Quincy Simmonds, MD  loperamide (IMODIUM A-D) 2 MG  tablet Take 2 mg by mouth 4 (four) times daily as needed for diarrhea or loose stools.    [provider]  losartan (COZAAR) 50 MG tablet Take 50 mg by mouth daily. Patient not taking: Reported on 06/11/2022 03/12/22   [provider]  Methoxy PEG-Epoetin Beta (MIRCERA IJ) Inject 1 Syringe into the muscle every 14 (fourteen) days. 03/12/22 05/20/23  [provider]  multivitamin (RENA-VIT) TABS tablet Take 1 tablet by mouth daily. Patient taking differently: Take 1 tablet by mouth See admin instructions. On Tuesday, Wednesday and Thursday 02/14/21   Charlton Amor, PA-C      Allergies    Patient has no known allergies.    Review of Systems   Review of Systems  HENT:  Positive for nosebleeds.   All other systems reviewed and are negative.   Physical Exam Updated Vital Signs BP (!) 177/81 (BP Location: Right Arm)   Pulse 88   Temp 99.6 F (37.6 C)   Resp 18   LMP 03/24/2022   SpO2 100%  Physical Exam Vitals and nursing note reviewed.  Constitutional:      General: She is not in acute distress.    Appearance: Normal appearance. She is well-developed. She is not ill-appearing, toxic-appearing or diaphoretic.  HENT:     Head: Normocephalic and atraumatic.     Nose: No nasal deformity.     Comments: I cannot visualize any site of active bleeding to try to clot, she does have persistent right-sided epistaxis.  Dried blood noted on the left side    Mouth/Throat:     Lips: Pink. No lesions.     Mouth: Mucous membranes are moist.     Pharynx: Oropharynx is clear. No oropharyngeal exudate or posterior oropharyngeal erythema.     Comments: No evidence of any dried blood in the back of throat. Eyes:     General: Gaze aligned appropriately. No scleral icterus.       Right eye: No discharge.        Left eye: No discharge.     Conjunctiva/sclera: Conjunctivae normal.     Right eye: Right conjunctiva is not injected. No exudate or hemorrhage.    Left eye: Left  conjunctiva is not injected. No exudate or hemorrhage. Pulmonary:     Effort: Pulmonary effort is normal. No respiratory distress.  Skin:    General: Skin is warm and dry.  Neurological:     Mental Status: She is alert and oriented to person, place, and time.  Psychiatric:        Mood and Affect: Mood normal.        Speech: Speech normal.        Behavior: Behavior normal. Behavior is cooperative.     ED Results / Procedures / Treatments   Labs (all labs ordered are listed, but only abnormal results are displayed) Labs Reviewed  CBC WITH DIFFERENTIAL/PLATELET - Abnormal; Notable for the following components:  Result Value   RBC 2.14 (*)    Hemoglobin 7.0 (*)    HCT 21.0 (*)    RDW 16.0 (*)    All other components within normal limits  CBC  BASIC METABOLIC PANEL    EKG None  Radiology DG Chest 2 View  Result Date: 06/25/2022 CLINICAL DATA:  Pleuritic chest pain. Fever. Shortness of breath. Tachypnea. Right lower lobe crackles on exam. EXAM: CHEST - 2 VIEW COMPARISON:  Chest two views 03/09/2022 FINDINGS: Cardiac silhouette is again mildly enlarged. Moderate calcifications within the aortic arch. Mildly decreased lung volumes. New bilateral lower lung horizontal linear densities overlying the bilateral diaphragms. Possible trace right pleural effusion. No pneumothorax. No acute skeletal abnormality. IMPRESSION: 1. New bilateral lower lung horizontal linear densities, possibly atelectasis but early pneumonia is difficult to exclude. 2. Possible trace right pleural effusion. 3. Unchanged mild cardiomegaly. Electronically Signed   By: Neita Garnet M.D.   On: 06/25/2022 17:29    Procedures Procedures   Medications Ordered in ED Medications  sodium chloride 0.9 % bolus 1,000 mL (has no administration in time range)  oxymetazoline (AFRIN) 0.05 % nasal spray 1 spray (1 spray Each Nare Given 06/27/22 0917)  fentaNYL (SUBLIMAZE) injection 50 mcg (50 mcg Intramuscular Given  06/27/22 1224)  acetaminophen (TYLENOL) tablet 1,000 mg (1,000 mg Oral Given 06/27/22 1218)    ED Course/ Medical Decision Making/ A&P Clinical Course as of 06/27/22 1530  Sat Jun 27, 2022  1009 Patient continuing to have persistent nasal bleed. Hgb 7.0 now and was 8.5 two days ago. [GL]  1200 Inserted rapid rhino rocket.  [GL]  1415 Discussed case with Dr Ernestene Kiel, ENT. Since patient is not bleeding through rhino rocket, it is felt more likely that the dripping from the left nare Is back flow. Patient is likely coagulated adequately. He recommends holding Plavix for 3-5 days and admission if repeat hgb is less than 7. If stable, then patient can follow up in the office next week [GL]    Clinical Course User Index [GL] Ytzel Gubler, Finis Bud, PA-C                           Medical Decision Making Amount and/or Complexity of Data Reviewed Labs: ordered.  Risk OTC drugs. Prescription drug management.   Patient is here with right-sided epistaxis has been ongoing for over 24 hours.  She is on Plavix anticoagulation.  She last took this today.  She is hemodynamically stable.  Her initial CBC showed a hemoglobin of 7.0.  It was just 8.5 yesterday which was a pretty significant drop.  She will need to have this repeated.  She failed treatment with pressure and Afrin.  She ultimately required a rapid Rhino Rocket in right nare.  She was still dripping blood out of the left nare so I contacted ENT and spoke with Dr. Sharee Holster.  Since patient is not actively bleeding through the [facility] and the bleeding noted on the left side is just dripping, it is felt to be most likely backflow and not an active bleed.  He feels we have good coagulation at this time.  He does recommend holding Plavix for 3 to 5 days.  He recommends if hemoglobin is less than 7 to admit that time.  Also dialysis patient and missed her session today, but there are no emergent dialysis needs at this time.  If she does require admission,  she will need to have this  done inpatient.    3:30 PM Care of MARCENE SCHIEBEL transferred to [facility] and Dr. Renaye Rakers at the end of my shift as the patient will require reassessment once labs/imaging have resulted. Patient presentation, ED course, and plan of care discussed with review of all pertinent labs and imaging. Please see his/her note for further details regarding further ED course and disposition. Plan at time of handoff is f/u on repeat labs. Admit and blood transfusion if needed. If bleeding restarts, recontact ENT. This may be altered or completely changed at the discretion of the oncoming team pending results of further workup.    Final Clinical Impression(s) / ED Diagnoses Final diagnoses:  Epistaxis    Rx / DC Orders ED Discharge Orders     None         Claudie Leach, PA-C 06/27/22 1530    Vanetta Mulders, MD 06/30/22 1542

## 2022-06-27 NOTE — ED Provider Notes (Signed)
Care transferred from Paulita Cradle, PA-C at time of sign out. See their note for full assessment.   Briefly: Patient is 46 y.o. female with history of ESRD on dialysis (TThSa) who presents to the ED with concerns for epistaxis. Last dialysis was Thursday. Pt is currently being treated for pneumonia.    Plan: Plan per previous PA-C: repeat hgb, if below 7, admit with pRBC transfusion and ENT follow up while admitted.  Labs Reviewed  CBC WITH DIFFERENTIAL/PLATELET - Abnormal; Notable for the following components:      Result Value   RBC 2.14 (*)    Hemoglobin 7.0 (*)    HCT 21.0 (*)    RDW 16.0 (*)    All other components within normal limits  CBC  BASIC METABOLIC PANEL    Clinical Course as of 06/27/22 1905  Sat Jun 27, 2022  1009 Patient continuing to have persistent nasal bleed. Hgb 7.0 now and was 8.5 two days ago. [GL]  1200 Inserted rapid rhino rocket.  [GL]  1415 Discussed case with Dr Sabino Gasser, ENT. Since patient is not bleeding through rhino rocket, it is felt more likely that the dripping from the left nare Is back flow. Patient is likely coagulated adequately. He recommends holding Plavix for 3-5 days and admission if repeat hgb is less than 7. If stable, then patient can follow up in the office next week [GL]  1626 Pt re-evaluated and discussed with patient CBC as well as treatment plan.  Answered all verbal questions.  Patient agreeable at this time. [SB]  1659 Hemoglobin(!!): 6.7 Discussed with patient decreased hemoglobin as well as plans for admission.  Patient notes that she has had to receive blood transfusions before in the past for similar concerns. [SB]  4854 Consult with IMTS who agrees with admission. [SB]    Clinical Course User Index [GL] Loeffler, Adora Fridge, PA-C [SB] Carlyle Mcelrath A, PA-C     Due to continuing downtrending hemoglobin from 8.52 days ago to now 6.7 today, feel that patient should be admitted to the hospital for symptomatic anemia secondary to  epistaxis.  Discussed with patient plans for admission, patient agreeable at this time.  Patient appears safe for admission at this time.  Blood transfusion initiated in the emergency department.    This chart was dictated using voice recognition software, Dragon. Despite the best efforts of this provider to proofread and correct errors, errors may still occur which can change documentation meaning.   [address], Myking Sar A, PA-C 06/27/22 1906    Wyvonnia Dusky, MD 06/27/22 2216

## 2022-06-27 NOTE — H&P (Incomplete)
Date: 06/27/2022               Patient Name:  Kathy Frank MRN: [id number]  DOB: [birth year] Age / Sex: 46 y.o., female   PCP: [name], MD         Medical Service: Internal Medicine Teaching Service         Attending Physician: Dr. Charise Killian, MD    First Contact: Dr. Marland Kitchen Pager: [phone]  Second Contact: Dr. Marland Kitchen Pager: [phone]       After Hours (After 5p/  First Contact Pager: [phone]  weekends / holidays): Second Contact Pager: [phone]   Chief Complaint: ***  History of Present Illness: ***  Meds:  Biktarvy 1 tablet daily Lantus 15 units daily at bedtime Albuterol 2 puffs q4h PRN Amlodipine 10 mg daily Augmentin 500-125 mg daily at bedtime Victoza 0.6 injection daily Calcitriol 0.25 mcg daily on TTSa Phoslo  No outpatient medications have been marked as taking for the 06/27/22 encounter [facility] Encounter).     Allergies: Allergies as of 06/27/2022   (No Known Allergies)   Past Medical History:  Diagnosis Date   Anal abscess    chronic   Anxiety    CAD (coronary artery disease)    CVA (cerebral vascular accident) ([place])    Depression 06/28/2006   Qualifier: Diagnosis of  By: Riccardo Dubin MD, [place]     Diabetes mellitus type 2 in obese ([place]) 06/28/1994   Dyspnea    uses oxygeb 2 liters per minute at dialysis   End stage renal disease on dialysis [facility])    on hemodialysis T/Th/Sat   Erosive esophagitis    Esophageal reflux    Eye redness    Gastroparesis    ? diabetic   GERD (gastroesophageal reflux disease)    History of blood transfusion 2019   Human immunodeficiency virus (HIV) disease ([place]) 04/23/2016   Hyperlipidemia    Hypertension    Metabolic bone disease 62/95/2841   Moderate nonproliferative diabetic retinopathy of both eyes ([place]) 11/21/2014   11/14/14: Noted on retinal imaging; needs follow-up imaging in 6 months  05/22/16: Noted on retinal imaging again; needs follow-up imaging in 6 months   PAD (peripheral artery disease) ([place])     Type 2 diabetes mellitus with diabetic peripheral angiopathy without gangrene ([employer]) 05/01/2019   Wears dentures    lower   Wound infection s/p L transmetatarsal amputation     Family History: ***  Social History: ***  Review of Systems: A complete ROS was negative except as per HPI. ***  Physical Exam: Blood pressure (!) 177/81, pulse 88, temperature 99.6 F (37.6 C), resp. rate 18, last menstrual period 03/24/2022, SpO2 100 %. ***  EKG: personally reviewed my interpretation is***  CXR: personally reviewed my interpretation is***  Assessment & Plan by Problem: Principal Problem:   Epistaxis   Dispo: Admit patient to {STATUS:3044014::"Observation with expected length of stay less than 2 midnights.","Inpatient with expected length of stay greater than 2 midnights."}  Signed: Farrel Gordon, DO 06/27/2022, 6:34 PM  Pager: @MYPAGER @ After 5pm on weekdays and 1pm on weekends: On Call pager: [phone]  Nosebleed started 1pm yesterday and didn't stop. Had hd today but was told not to go in due to nose bleed. So missed hd today. Has chest discomfort due to cough and pneumonia, migraine. Nothing else going on.  No n/v, has cough, head hurts on temples. Has history of migraines. Doesn't take ppx for this because kidney doc took her off.  Worse with lights, which is why she has lights off. Has had nose bleeds before that were bad but they always stop. Hasn't seen ent doc yet.   Meds: Biktarvy Lantus 15 Not on plavix Albuterol Amlod Augmentin Lipitor no Aspirin no Hydral no Victoza 1.6 Losartan no  Calcitriol at dialysis Phoslo Rena-vit no  Lives in [place] Smokes marijuana 1x/wk Some vaping

## 2022-06-27 NOTE — Consult Note (Incomplete)
Renal Service Consult Note [facility]  Kathy Frank 06/28/2022 [name], MD Requesting Physician: Dr. Saverio Danker  Reason for Consult: ESRD pt admitted for epistaxis HPI: The patient is a 46 y.o. year-old w/ PMH as below who presented to ED 11/18 for epistaxis. Missed HD due to bleeding. Some cough and mild chest discomfort. Was here 11/02 for similar problem. We are asked to see for dialysis.   Pt seen in room. No sob, cough or orthopnea. Did miss HD yesterday. Having a migraine HA, no abd pain or CP.   ROS - denies CP, no joint pain, no blurry vision, no rash, no diarrhea, no dysuria, no difficulty voiding   Past Medical History  Past Medical History:  Diagnosis Date   Anal abscess    chronic   Anxiety    CAD (coronary artery disease)    CVA (cerebral vascular accident) ([place])    Depression 06/28/2006   Qualifier: Diagnosis of  By: Riccardo Dubin MD, Todd     Diabetes mellitus type 2 in obese ([place]) 06/28/1994   Dyspnea    uses oxygeb 2 liters per minute at dialysis   End stage renal disease on dialysis [facility])    on hemodialysis T/Th/Sat   Erosive esophagitis    Esophageal reflux    Eye redness    Gastroparesis    ? diabetic   GERD (gastroesophageal reflux disease)    History of blood transfusion 2019   Human immunodeficiency virus (HIV) disease ([place]) 04/23/2016   Hyperlipidemia    Hypertension    Metabolic bone disease 30/16/0109   Moderate nonproliferative diabetic retinopathy of both eyes ([place]) 11/21/2014   11/14/14: Noted on retinal imaging; needs follow-up imaging in 6 months  05/22/16: Noted on retinal imaging again; needs follow-up imaging in 6 months   PAD (peripheral artery disease) ([place])    Type 2 diabetes mellitus with diabetic peripheral angiopathy without gangrene ([place]) 05/01/2019   Wears dentures    lower   Wound infection s/p L transmetatarsal amputation    Past Surgical History  Past Surgical History:  Procedure Laterality Date    ABDOMINAL AORTOGRAM W/LOWER EXTREMITY N/A 12/13/2020   Procedure: ABDOMINAL AORTOGRAM W/LOWER EXTREMITY;  Surgeon: Angelia Mould, MD;  Location: [place] CV LAB;  Service: Cardiovascular;  Laterality: N/A;   AMPUTATION Left 07/08/2018   Procedure: AMPUTATION FORTH RAY LEFT FOOT;  Surgeon: [name], MD;  Location: [place];  Service: Orthopedics;  Laterality: Left;   AMPUTATION Left 08/09/2018   Procedure: Left Transmetatarsal Amputation;  Surgeon: [name], MD;  Location: [place];  Service: Orthopedics;  Laterality: Left;   AMPUTATION Left 10/08/2018   Procedure: LEFT BELOW KNEE AMPUTATION;  Surgeon: [name], MD;  Location: [place];  Service: Orthopedics;  Laterality: Left;   AMPUTATION Left 10/28/2018   Procedure: REVISION BELOW KNEE AMPUTATION;  Surgeon: [name], MD;  Location: [place];  Service: Orthopedics;  Laterality: Left;   AMPUTATION Left 12/16/2018   Procedure: LEFT ABOVE KNEE AMPUTATION;  Surgeon: [name], MD;  Location: [place];  Service: Orthopedics;  Laterality: Left;   AMPUTATION Right 01/31/2021   Procedure: AMPUTATION BELOW KNEE;  Surgeon: Serafina Mitchell, MD;  Location: [facility] OR;  Service: Vascular;  Laterality: Right;   AMPUTATION Right 03/28/2021   Procedure: REVISION OF RIGHT BELOW KNEE AMPUTATION;  Surgeon: Serafina Mitchell, MD;  Location: [place];  Service: Vascular;  Laterality: Right;   AMPUTATION Right 04/18/2021   Procedure: right  below knee amputation/washout placement wound vac;  Surgeon: Cherre Robins, MD;  Location: [place];  Service: Vascular;  Laterality: Right;   AV FISTULA PLACEMENT Left 10/14/2018   Procedure: Arteriovenous (Av) Fistula Creation Left Arm;  Surgeon: [name], MD;  Location: [place];  Service: Vascular;  Laterality: Left;   [place] Left 04/14/2019   Procedure: BASILIC VEIN TRANSPOSITION SECOND STAGE LEFT ARM;  Surgeon: Rosetta Posner, MD;  Location: [place];  Service: Vascular;  Laterality: Left;    CORONARY STENT INTERVENTION N/A 05/21/2021   Procedure: CORONARY STENT INTERVENTION;  Surgeon: Leonie Man, MD;  Location: [place] CV LAB;  Service: Cardiovascular;  Laterality: N/A;   INCISION AND DRAINAGE ABSCESS N/A 10/24/2020   Procedure: exicision of hydradenitis;  Surgeon: Leighton Ruff, MD;  Location: [place];  Service: General;  Laterality: N/A;  45 min   INSERTION OF DIALYSIS CATHETER Right 04/14/2019   Procedure: INSERTION OF DIALYSIS CATHETER;  Surgeon: Rosetta Posner, MD;  Location: [facility] OR;  Service: Vascular;  Laterality: Right;   LEFT HEART CATH AND CORONARY ANGIOGRAPHY N/A 05/21/2021   Procedure: LEFT HEART CATH AND CORONARY ANGIOGRAPHY;  Surgeon: Leonie Man, MD;  Location: [place] [facility] LAB;  Service: Cardiovascular;  Laterality: N/A;   LOWER EXTREMITY ANGIOGRAPHY N/A 07/05/2018   Procedure: LOWER EXTREMITY ANGIOGRAPHY;  Surgeon: Serafina Mitchell, MD;  Location: [place] [facility] LAB;  Service: Cardiovascular;  Laterality: N/A;   PERIPHERAL VASCULAR ATHERECTOMY Right 12/13/2020   Procedure: PERIPHERAL VASCULAR ATHERECTOMY;  Surgeon: Angelia Mould, MD;  Location: [place] CV LAB;  Service: Cardiovascular;  Laterality: Right;  Superficial femoral   PERIPHERAL VASCULAR BALLOON ANGIOPLASTY Left 07/05/2018   Procedure: PERIPHERAL VASCULAR BALLOON ANGIOPLASTY;  Surgeon: Serafina Mitchell, MD;  Location: [place] CV LAB;  Service: Cardiovascular;  Laterality: Left;  SFA   PERIPHERAL VASCULAR BALLOON ANGIOPLASTY Right 12/13/2020   Procedure: PERIPHERAL VASCULAR BALLOON ANGIOPLASTY;  Surgeon: Angelia Mould, MD;  Location: [place] CV LAB;  Service: Cardiovascular;  Laterality: Right;  Peroneal artery, anterior tibial artery   RECTAL EXAM UNDER ANESTHESIA N/A 10/24/2020   Procedure: EXAM UNDER ANESTHESIA;  Surgeon: Leighton Ruff, MD;  Location[facility];  Service: General;  Laterality: N/A;   STUMP REVISION Left 10/19/2018    Procedure: REVISION LEFT BELOW KNEE AMPUTATION;  Surgeon: [name], MD;  Location: [place];  Service: Orthopedics;  Laterality: Left;   TUBAL LIGATION  2002   Family History  Family History  Problem Relation Age of Onset   Diabetes Mother    Diabetes Brother    Diabetes Daughter    Diabetes Daughter    Mental retardation Brother        died from PNA   Diabetes Maternal Grandmother    Social History  reports that she quit smoking about 7 years ago. Her smoking use included cigarettes. She started smoking about 8 years ago. She has a 1.00 pack-year smoking history. She has never used smokeless tobacco. She reports that she does not currently use alcohol. She reports current drug use. Drug: Marijuana. Allergies No Known Allergies Home medications Prior to Admission medications   Medication Sig Start Date End Date Taking? Authorizing Provider  Accu-Chek Softclix Lancets lancets Use as instructed 05/02/21   Atway, Rayann N, DO  acetaminophen (TYLENOL) 500 MG tablet Take 1,000 mg by mouth every 6 (six) hours as needed for moderate pain.    [provider]  albuterol (VENTOLIN HFA) 108 (90 Base) MCG/ACT inhaler Inhale 2 puffs into the lungs every 4 (four) hours as needed for wheezing or shortness of breath. 02/14/21   Angiulli, Lavon Paganini, PA-C  amLODipine (NORVASC) 10 MG tablet Take 1 tablet (10 mg total) by mouth daily. 05/15/21   [name], MD  amoxicillin-clavulanate (AUGMENTIN) 500-125 MG tablet Take 1 tablet by mouth at bedtime. 06/26/22   Virl Axe, MD  atorvastatin (LIPITOR) 80 MG tablet Take 1 tablet (80 mg total) by mouth daily. 05/24/21   Masters, Katie, DO  bictegravir-emtricitabine-tenofovir AF (BIKTARVY) 50-200-25 MG TABS tablet Take 1 tablet by mouth daily. OVERDUE FOR OFFICE FOLLOW UP - PLEASE CALL OUR OFFICE TO SCHEDULE [phone] Patient taking differently: Take 1 tablet by mouth daily. 06/08/22   Michel Bickers, MD  calcitRIOL (ROCALTROL) 0.25 MCG capsule  Take 1 capsule (0.25 mcg total) by mouth Every Tuesday,Thursday,and Saturday with dialysis. 04/01/21   Ulyses Amor, PA-C  calcium acetate (PHOSLO) 667 MG capsule Take 3 capsules (2,001 mg total) by mouth 3 (three) times daily with meals. 02/14/21   Angiulli, Lavon Paganini, PA-C  clopidogrel (PLAVIX) 75 MG tablet Take 1 tablet by mouth once daily 02/06/22   Waynetta Sandy, MD  Continuous Blood Gluc Receiver (DEXCOM G7 RECEIVER) DEVI Use to check blood sugars cntinuously 12/09/21   [name], MD  Continuous Blood Gluc Sensor (DEXCOM G7 SENSOR) MISC Use to check blood sugar continuously 12/09/21   [name], MD  cyclobenzaprine (FLEXERIL) 5 MG tablet Take 5 mg by mouth 2 (two) times daily as needed for muscle spasms. 05/21/22   [provider]  EQ ASPIRIN ADULT LOW DOSE 81 MG tablet TAKE 1 TABLET BY MOUTH ONCE DAILY. SWALLOW WHOLE. Patient taking differently: Take 81 mg by mouth daily. 02/06/22   Waynetta Sandy, MD  glucose blood test strip Use as instructed 05/02/21   Atway, Rayann N, DO  hydrALAZINE (APRESOLINE) 50 MG tablet Take 1 tablet by mouth 4 times daily 03/02/22   [name], MD  insulin glargine (LANTUS SOLOSTAR) 100 UNIT/ML Solostar Pen Inject 8 Units into the skin at bedtime. Patient taking differently: Inject 15 Units into the skin at bedtime. 02/06/22 04/05/28  [name], MD  Insulin Pen Needle (PENTIPS) 32G X 4 MM MISC Use as directed 02/14/21   Meredith Staggers, MD  liraglutide (VICTOZA) 18 MG/3ML SOPN Inject 0.6 mg into the skin daily. 02/06/22 02/01/23  [name], MD  loperamide (IMODIUM A-D) 2 MG tablet Take 2 mg by mouth 4 (four) times daily as needed for diarrhea or loose stools.    [provider]  losartan (COZAAR) 50 MG tablet Take 50 mg by mouth daily. Patient not taking: Reported on 06/11/2022 03/12/22   [provider]  Methoxy PEG-Epoetin Beta (MIRCERA IJ) Inject 1 Syringe into the muscle every 14 (fourteen) days. 03/12/22  05/20/23  [provider]  multivitamin (RENA-VIT) TABS tablet Take 1 tablet by mouth daily. Patient taking differently: Take 1 tablet by mouth See admin instructions. On Tuesday, Wednesday and Thursday 02/14/21   Cathlyn Parsons, PA-C     Vitals:   06/28/22 1535 06/28/22 1600 06/28/22 1630 06/28/22 1700  BP:  (!) 183/74 94/74 (!) 144/83  Pulse:    91  Resp:  (!) 22  13  Temp:      TempSrc:      SpO2:  100%  100%  Weight: 78.3 kg      Exam Gen alert, no distress No  rash, cyanosis or gangrene Sclera anicteric, throat clear  No jvd or bruits Chest clear bilat to bases RRR no MRG Abd soft ntnd no mass or ascites +bs GU defer MS bilat LE amputation Ext no LE or UE edema Neuro is alert, Ox 3 , nf    LUA AVF+bruit   Home meds include - norvasc 10 qd, biktarvy 1 qd     OP HD: TTS [facility]  4h   400/1.5   74.5kg  2K/3ca bath  Hep none  LUA AVF - last HD 11/16, post wt 74.8kg - venofer 50 weekly - calcitriol 1.25 ug po tiw - mircera 75 ug IV q4, last 11/4   Assessment/ Plan: Epistaxis - recurrent issue, here in early November w/ same thing. SP nasal packing. Per primary team. ESRD - on HD TTS. Missed HD Sat, plan HD today for yesterday. Next HD will be tomorrow due to holiday schedule. No heparin w/ HD (her usual order).  HTN/ vol - no edema, euvolemic on exam.  Anemia esrd - Hb's are low in 6-8 range. Transfuse prn. Getting esa q 4 wks, last was 2 wks ago.  MBD ckd - Ca low, get alb/ phos. Cont po vdra.  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 HIV - on Biktarvy IDDM - per pmd      Rob Javon Snee  MD 06/28/2022, 5:49 PM Recent Labs  Lab 06/27/22 1300 06/27/22 1435 06/28/22 0224  HGB 6.7*  --  7.2*  ALBUMIN  --  2.7*  --   CALCIUM  --  7.7* 7.6*  PHOS  --  8.0*  --   CREATININE  --  8.96* 9.83*  K  --  5.0 4.4   Inpatient medications:  acetaminophen  1,000 mg Oral Once   amLODipine  10 mg Oral Daily   amoxicillin-clavulanate  1 tablet  Oral QHS   bictegravir-emtricitabine-tenofovir AF  1 tablet Oral Daily   [START ON 06/30/2022] calcitRIOL  1.25 mcg Oral Q T,Th,Sa-HD   Chlorhexidine Gluconate Cloth  6 each Topical Q0600   [START ON 06/29/2022] Chlorhexidine Gluconate Cloth  6 each Topical Q0600   hydrALAZINE       insulin aspart  0-5 Units Subcutaneous QHS   insulin aspart  0-9 Units Subcutaneous TID WC   melatonin  3 mg Oral QHS    sodium chloride     hydrALAZINE

## 2022-06-27 NOTE — ED Triage Notes (Signed)
Patient here with intermittent nosebleed x 1 day. States that she can feel them going down back of throat. Hx of same

## 2022-06-27 NOTE — H&P (Cosign Needed Addendum)
Date: 06/27/2022               Patient Name:  Kathy Frank MRN: [id number]  DOB: 24-Mar-1976 Age / Sex: 46 y.o., female   PCP: [name], MD         Medical Service: Internal Medicine Teaching Service         Attending Physician: Dr. Fredia Sorrow, MD    First Contact: Gaylyn Rong, MD      Pager: [place] [phone]      Second Contact: Farrel Gordon, DO      Pager: ED [phone]           After Hours (After 5p/  First Contact Pager: [phone]  weekends / holidays): Second Contact Pager: [phone]   SUBJECTIVE   Chief Complaint: epistaxis  History of Present Illness:  Kathy Frank is a 46 year old female with past medical history CAD s/p stent, CVA, PAD, HTN, ESRD on dialysis, HIV, insulin-dependent type 2 diabetes presenting for epistaxis.    She states that her nosebleed started at 1 PM yesterday and has not stopped since then.  She has bleeding from both nares, but worse from the right.  Bleeding started spontaneously without any inciting events.  She missed her dialysis session because they told her to come to the ED.  Additionally, she has had chest discomfort and cough for the past few days and was diagnosed with pneumonia and started on Augmentin yesterday.  She additionally endorses migraine with photophobia and phonophobia.  She denies any nausea or vomiting, GI bleeding, anginal chest pain, abdominal pain, or any other bleeding.  She has had nosebleeds before but they usually spontaneously resolved.   ED Course: 1 unit PRBCs transfusion ordered due to critically low hemoglobin.  ENT consulted, have not assessed patient yet.  Rhino Rocket placed in right nare.  Meds:  No outpatient medications have been marked as taking for the 06/27/22 encounter [facility] Encounter).  Biktarvy daily Lantus 15, Victoza 0.6 daily ASA 81, Plavix 75 (not taking states someone stopped this med) Lipitor 80 mg daily (not taking) Albuterol Amlodipine 10 mg daily, losartan 50  mg daily (not taking), hydralazine 50 mg 4 times daily (not taking) Augmentin 500-125 nightly for 7 days (first dose yesterday)  HD supplements and Mircera every 14 days Tylenol and Flexeril 5 twice daily as needed for symptom management   Past Medical History  Past Surgical History:  Procedure Laterality Date   ABDOMINAL AORTOGRAM W/LOWER EXTREMITY N/A 12/13/2020   Procedure: ABDOMINAL AORTOGRAM W/LOWER EXTREMITY;  Surgeon: Angelia Mould, MD;  Location: [place] CV LAB;  Service: Cardiovascular;  Laterality: N/A;   AMPUTATION Left 07/08/2018   Procedure: AMPUTATION FORTH RAY LEFT FOOT;  Surgeon: [name], MD;  Location: [place];  Service: Orthopedics;  Laterality: Left;   AMPUTATION Left 08/09/2018   Procedure: Left Transmetatarsal Amputation;  Surgeon: [name], MD;  Location: [place];  Service: Orthopedics;  Laterality: Left;   AMPUTATION Left 10/08/2018   Procedure: LEFT BELOW KNEE AMPUTATION;  Surgeon: [name], MD;  Location: [place];  Service: Orthopedics;  Laterality: Left;   AMPUTATION Left 10/28/2018   Procedure: REVISION BELOW KNEE AMPUTATION;  Surgeon: [name], MD;  Location: [place];  Service: Orthopedics;  Laterality: Left;   AMPUTATION Left 12/16/2018   Procedure: LEFT ABOVE KNEE AMPUTATION;  Surgeon: [name], MD;  Location: [place];  Service: Orthopedics;  Laterality: Left;   AMPUTATION Right 01/31/2021   Procedure: AMPUTATION  BELOW KNEE;  Surgeon: Serafina Mitchell, MD;  Location: [facility] OR;  Service: Vascular;  Laterality: Right;   AMPUTATION Right 03/28/2021   Procedure: REVISION OF RIGHT BELOW KNEE AMPUTATION;  Surgeon: Serafina Mitchell, MD;  Location: [place];  Service: Vascular;  Laterality: Right;   AMPUTATION Right 04/18/2021   Procedure: right below knee amputation/washout placement wound vac;  Surgeon: Cherre Robins, MD;  Location: [place];  Service: Vascular;  Laterality: Right;   AV FISTULA PLACEMENT Left 10/14/2018   Procedure: Arteriovenous  (Av) Fistula Creation Left Arm;  Surgeon: [name], MD;  Location: [place];  Service: Vascular;  Laterality: Left;   [place] Left 04/14/2019   Procedure: BASILIC VEIN TRANSPOSITION SECOND STAGE LEFT ARM;  Surgeon: Rosetta Posner, MD;  Location: [place];  Service: Vascular;  Laterality: Left;   CORONARY STENT INTERVENTION N/A 05/21/2021   Procedure: CORONARY STENT INTERVENTION;  Surgeon: Leonie Man, MD;  Location: [place] CV LAB;  Service: Cardiovascular;  Laterality: N/A;   INCISION AND DRAINAGE ABSCESS N/A 10/24/2020   Procedure: exicision of hydradenitis;  Surgeon: Leighton Ruff, MD;  Location: [place];  Service: General;  Laterality: N/A;  45 min   INSERTION OF DIALYSIS CATHETER Right 04/14/2019   Procedure: INSERTION OF DIALYSIS CATHETER;  Surgeon: Rosetta Posner, MD;  [facility];  Service: Vascular;  Laterality: Right;   LEFT HEART CATH AND CORONARY ANGIOGRAPHY N/A 05/21/2021   Procedure: LEFT HEART CATH AND CORONARY ANGIOGRAPHY;  Surgeon: Leonie Man, MD;  Location: [place] CV LAB;  Service: Cardiovascular;  Laterality: N/A;   LOWER EXTREMITY ANGIOGRAPHY N/A 07/05/2018   Procedure: LOWER EXTREMITY ANGIOGRAPHY;  Surgeon: Serafina Mitchell, MD;  Location: [place] CV LAB;  Service: Cardiovascular;  Laterality: N/A;   PERIPHERAL VASCULAR ATHERECTOMY Right 12/13/2020   Procedure: PERIPHERAL VASCULAR ATHERECTOMY;  Surgeon: Angelia Mould, MD;  Location: [place] [facility] LAB;  Service: Cardiovascular;  Laterality: Right;  Superficial femoral   PERIPHERAL VASCULAR BALLOON ANGIOPLASTY Left 07/05/2018   Procedure: PERIPHERAL VASCULAR BALLOON ANGIOPLASTY;  Surgeon: Serafina Mitchell, MD;  Location: [place] CV LAB;  Service: Cardiovascular;  Laterality: Left;  SFA   PERIPHERAL VASCULAR BALLOON ANGIOPLASTY Right 12/13/2020   Procedure: PERIPHERAL VASCULAR BALLOON ANGIOPLASTY;  Surgeon: Angelia Mould, MD;  Location: [place] [facility] LAB;  Service: Cardiovascular;  Laterality: Right;  Peroneal artery, anterior tibial artery   RECTAL EXAM UNDER ANESTHESIA N/A 10/24/2020   Procedure: EXAM UNDER ANESTHESIA;  Surgeon: Leighton Ruff, MD;  Location: [facility];  Service: General;  Laterality: N/A;   STUMP REVISION Left 10/19/2018   Procedure: REVISION LEFT BELOW KNEE AMPUTATION;  Surgeon: [name], MD;  Location: [place];  Service: Orthopedics;  Laterality: Left;   TUBAL LIGATION  2002    Social:  Lives With: Daughter Occupation: Support: Support as above Level of Function: Wheelchair bound bilateral lower extremity amputations [facility]CP: [facility] Substances: Smokes marijuana once a week, occasionally vapes, no other substance use  Family History:  Family History  Problem Relation Age of Onset   Diabetes Mother    Diabetes Brother    Diabetes Daughter    Diabetes Daughter    Mental retardation Brother        died from PNA   Diabetes Maternal Grandmother      Allergies: Allergies as of 06/27/2022   (No Known Allergies)    Review of Systems: A complete ROS was negative except  as per HPI.   OBJECTIVE:   Physical Exam: Blood pressure (!) 177/81, pulse 88, temperature 99.6 F (37.6 C), resp. rate 18, last menstrual period 03/24/2022, SpO2 100 %.  Constitutional: NAD HEENT: Rhino Rocket in right nare.  Blood crusting left nare, but no active bleeding at this time.  No oral mucosal bleeding. Cardiovascular: RRR, no murmurs, rubs or gallops Pulmonary/Chest: increased work of breathing on room air, occasional coughing fits, decreased breath sounds at bilateral lung bases Extremities: Bilateral lower extremity amputations  Labs:    Latest Ref Rng & Units 06/27/2022    1:00 PM 06/27/2022    9:14 AM 06/25/2022    4:32 PM 06/11/2022   12:02 PM 04/27/2022    8:19 PM 03/09/2022    5:12 AM 11/11/2021    3:55 AM  CBC EXTENDED  WBC 4.0 - 10.5 K/uL 8.5  7.8  8.0  5.9  5.4  4.7  3.9   RBC 3.87 -  5.11 MIL/uL 2.15  2.14  2.68  3.06  2.99  3.31  3.94   Hemoglobin 12.0 - 15.0 g/dL 6.7  7.0  8.5  9.7  9.5  10.3  11.3   HCT 36.0 - 46.0 % 20.5  21.0  25.9  29.3  29.2  31.5  35.1   Platelets 150 - 400 K/uL 353  323  271  315  300  269  271   NEUT# 1.7 - 7.7 K/uL  5.6  6.3  3.4  2.9  2.3    Lymph# 0.7 - 4.0 K/uL  1.1  0.8  1.7  1.7  1.7        Latest Ref Rng & Units 06/27/2022    2:35 PM 06/11/2022   12:02 PM 04/27/2022    8:19 PM 03/09/2022    5:12 AM 11/11/2021    3:55 AM 11/10/2021   12:24 AM 11/09/2021    2:24 PM  CMP  Glucose 70 - 99 mg/dL 141  130  176  189  115  184  141   BUN 6 - 20 mg/dL 71  64  68  66  28  72  73   Creatinine 0.44 - 1.00 mg/dL 8.96  9.25  10.66  12.67  6.61  10.25  10.29   Sodium 135 - 145 mmol/L 137  139  135  141  135  136  139   Potassium 3.5 - 5.1 mmol/L 5.0  7.0  4.6  4.3  4.0  4.1  5.0   Chloride 98 - 111 mmol/L 89  100  91  104  95  98  99   CO2 22 - 32 mmol/L 21  17  22  20  24  18  19    Calcium 8.9 - 10.3 mg/dL 7.7  9.4  9.7  9.0  7.7  6.6  7.0   Total Protein 6.5 - 8.1 g/dL   7.0  7.6   7.8  <3.0   Total Bilirubin 0.3 - 1.2 mg/dL   0.3  0.5   0.6  0.5   Alkaline Phos 38 - 126 U/L   38  44   53  66   AST 15 - 41 U/L   11  10   11  15    ALT 0 - 44 U/L   8  8   8  7    INR 1.2, PTT 29  Imaging:   EKG:   ASSESSMENT & PLAN:   Kathy Frank is a  46 y.o. female with PMH CAD s/p stent, CVA, PAD, HTN, ESRD on dialysis, HIV, insulin-dependent type 2 diabetes who presented with epistaxis and admitted for epistaxis on hospital day 0  #Persistent epistaxis Patient has had more than 24 hours of epistaxis at this point.  Bleeding seems to be controlled with Rhino Rocket in place in right nare.  Hemoglobin dropped below 7 due to persistent bleeding.  Coags unremarkable.  Pending 1 unit PRBCs in the ED and ENT evaluation. - Follow-up ENT recs, maintain Rhino Rocket in place for now. - Follow-up posttransfusion H&H  #Pneumonia Still symptomatic, satting well  on room air in ED.  We will continue course of Augmentin. - Continue Augmentin 500-1 25 nightly (day 2 of 7) - Continuous pulse ox, supplemental O2 if needed  #Migraines -Treat with Tylenol, can consider other meds if refractory  #ESRD on TTS HD Missed HD today due to epistaxis and symptoms from pneumonia.  Does not appear significantly volume overloaded on exam and BUN is elevated but clear not causing uremia at this point. - Will consult nephrology for dialysis either tonight or tomorrow nonurgently.  #HTN #CAD s/p P stent #CVA #PAD Patient is normotensive to hypertensive in the ED.  No anginal chest pain.  States that her DAPT was stopped but was multiple of her antihypertensives including losartan and hydralazine and that she is only taking amlodipine at this time.  Her stent placement was in 05/2021, but given her history of remote cerebrovascular infarcts, she likely should be on at least aspirin unless this was stopped for some reason. Lipitor also should be continued.  Additionally, do not see any documentation regarding stopping hydralazine or losartan. -Consider restarting aspirin 81, lipitor  - We will start only amlodipine 10 for now as patient is not taking hydralazine and irbesartan at home, but would readdress and reconsider in the outpatient setting  #HIV stable on [place]  #Insulin-dependent T2DM Glucoses adequately controlled in ED, will plan for just SSI for now -SSI  Diet: Carb/Renal VTE:    IVF: None, Code: Full  Prior to Admission Living Arrangement: Home, living with daughter Anticipated Discharge Location: Home Barriers to Discharge: clinical improvement  Dispo: Admit patient to Observation with expected length of stay less than 2 midnights.  Signed: Linus Galas, MD Internal Medicine Resident PGY-1  06/27/2022, 6:14 PM

## 2022-06-27 NOTE — Discharge Instructions (Signed)
You should stop taking your plavix for three days. Keep nasal packing in your nose until you are evaluated by ENT. Call ENT on Monday to schedule your appointment for removal. If you bleed through the nasal rhino rocket on the right, please return to the Emergency Department.

## 2022-06-27 NOTE — ED Notes (Signed)
ED TO INPATIENT HANDOFF REPORT  ED Nurse Name and Phone #: [phone]   S Name/Age/Gender Kathy Frank 46 y.o. female Room/Bed: 030C/030C  Code Status   Code Status: Full Code  Home/SNF/Other Home Patient oriented to: self, place, and time Is this baseline?   Triage Complete: Triage complete  Chief Complaint Epistaxis [R04.0]  Triage Note Patient here with intermittent nosebleed x 1 day. States that she can feel them going down back of throat. Hx of same   Allergies No Known Allergies  Level of Care/Admitting Diagnosis ED Disposition     ED Disposition  Admit   Condition  --   Comment  Hospital Area: [place] [[id number]]  Level of Care: Telemetry Medical [104]  May place patient in observation at [facility] or [facility] if equivalent level of care is available:: No  Covid Evaluation: Asymptomatic - no recent exposure (last 10 days) testing not required  Diagnosis: Epistaxis [784.7.ICD-9-CM]  Admitting Physician: Charise Killian [[id number]]  Attending Physician: Charise Killian [[id number]]          B Medical/Surgery History Past Medical History:  Diagnosis Date   Anal abscess    chronic   Anxiety    CAD (coronary artery disease)    CVA (cerebral vascular accident) ([place])    Depression 06/28/2006   Qualifier: Diagnosis of  By: Riccardo Dubin MD, [place]     Diabetes mellitus type 2 in obese ([place]) 06/28/1994   Dyspnea    uses oxygeb 2 liters per minute at dialysis   End stage renal disease on dialysis [name])    on hemodialysis T/Th/Sat   Erosive esophagitis    Esophageal reflux    Eye redness    Gastroparesis    ? diabetic   GERD (gastroesophageal reflux disease)    History of blood transfusion 2019   Human immunodeficiency virus (HIV) disease ([place]) 04/23/2016   Hyperlipidemia    Hypertension    Metabolic bone disease 01/77/9390   Moderate nonproliferative diabetic retinopathy of both eyes ([place]) 11/21/2014   11/14/14: Noted on retinal imaging; needs  follow-up imaging in 6 months  05/22/16: Noted on retinal imaging again; needs follow-up imaging in 6 months   PAD (peripheral artery disease) ([place])    Type 2 diabetes mellitus with diabetic peripheral angiopathy without gangrene ([place]) 05/01/2019   Wears dentures    lower   Wound infection s/p L transmetatarsal amputation    Past Surgical History:  Procedure Laterality Date   ABDOMINAL AORTOGRAM W/LOWER EXTREMITY N/A 12/13/2020   Procedure: ABDOMINAL AORTOGRAM W/LOWER EXTREMITY;  Surgeon: Angelia Mould, MD;  Location: [place] [facility];  Service: Cardiovascular;  Laterality: N/A;   AMPUTATION Left 07/08/2018   Procedure: AMPUTATION FORTH RAY LEFT FOOT;  Surgeon: [name], MD;  Location: [place];  Service: Orthopedics;  Laterality: Left;   AMPUTATION Left 08/09/2018   Procedure: Left Transmetatarsal Amputation;  Surgeon: [name], MD;  Location: [place];  Service: Orthopedics;  Laterality: Left;   AMPUTATION Left 10/08/2018   Procedure: LEFT BELOW KNEE AMPUTATION;  Surgeon: [name], MD;  Location: [place];  Service: Orthopedics;  Laterality: Left;   AMPUTATION Left 10/28/2018   Procedure: REVISION BELOW KNEE AMPUTATION;  Surgeon: [name], MD;  Location: [place];  Service: Orthopedics;  Laterality: Left;   AMPUTATION Left 12/16/2018   Procedure: LEFT ABOVE KNEE AMPUTATION;  Surgeon: [name], MD;  Location: [place];  Service: Orthopedics;  Laterality: Left;   AMPUTATION Right  01/31/2021   Procedure: AMPUTATION BELOW KNEE;  Surgeon: Serafina Mitchell, MD;  Location: [facility];  Service: Vascular;  Laterality: Right;   AMPUTATION Right 03/28/2021   Procedure: REVISION OF RIGHT BELOW KNEE AMPUTATION;  Surgeon: Serafina Mitchell, MD;  Location: [place];  Service: Vascular;  Laterality: Right;   AMPUTATION Right 04/18/2021   Procedure: right below knee amputation/washout placement wound vac;  Surgeon: Cherre Robins, MD;  Location: [place];  Service: Vascular;  Laterality: Right;    AV FISTULA PLACEMENT Left 10/14/2018   Procedure: Arteriovenous (Av) Fistula Creation Left Arm;  Surgeon: [name], MD;  Location: [place];  Service: Vascular;  Laterality: Left;   [place] Left 04/14/2019   Procedure: BASILIC VEIN TRANSPOSITION SECOND STAGE LEFT ARM;  Surgeon: Rosetta Posner, MD;  Location: [place];  Service: Vascular;  Laterality: Left;   CORONARY STENT INTERVENTION N/A 05/21/2021   Procedure: CORONARY STENT INTERVENTION;  Surgeon: Leonie Man, MD;  Location: [place] [facility];  Service: Cardiovascular;  Laterality: N/A;   INCISION AND DRAINAGE ABSCESS N/A 10/24/2020   Procedure: exicision of hydradenitis;  Surgeon: Leighton Ruff, MD;  Location: [place];  Service: General;  Laterality: N/A;  45 min   INSERTION OF DIALYSIS CATHETER Right 04/14/2019   Procedure: INSERTION OF DIALYSIS CATHETER;  Surgeon: Rosetta Posner, MD;  Location: [facility];  Service: Vascular;  Laterality: Right;   LEFT HEART CATH AND CORONARY ANGIOGRAPHY N/A 05/21/2021   Procedure: LEFT HEART CATH AND CORONARY ANGIOGRAPHY;  Surgeon: Leonie Man, MD;  Location: [place] [facility];  Service: Cardiovascular;  Laterality: N/A;   LOWER EXTREMITY ANGIOGRAPHY N/A 07/05/2018   Procedure: LOWER EXTREMITY ANGIOGRAPHY;  Surgeon: Serafina Mitchell, MD;  Location: [place] [facility];  Service: Cardiovascular;  Laterality: N/A;   PERIPHERAL VASCULAR ATHERECTOMY Right 12/13/2020   Procedure: PERIPHERAL VASCULAR ATHERECTOMY;  Surgeon: Angelia Mould, MD;  Location: [place] [facility];  Service: Cardiovascular;  Laterality: Right;  Superficial femoral   PERIPHERAL VASCULAR BALLOON ANGIOPLASTY Left 07/05/2018   Procedure: PERIPHERAL VASCULAR BALLOON ANGIOPLASTY;  Surgeon: Serafina Mitchell, MD;  Location: [place] [facility];  Service: Cardiovascular;  Laterality: Left;  SFA   PERIPHERAL VASCULAR BALLOON ANGIOPLASTY Right 12/13/2020   Procedure: PERIPHERAL VASCULAR BALLOON  ANGIOPLASTY;  Surgeon: Angelia Mould, MD;  Location: [place] [facility];  Service: Cardiovascular;  Laterality: Right;  Peroneal artery, anterior tibial artery   RECTAL EXAM UNDER ANESTHESIA N/A 10/24/2020   Procedure: EXAM UNDER ANESTHESIA;  Surgeon: Leighton Ruff, MD;  Location: [facility];  Service: General;  Laterality: N/A;   STUMP REVISION Left 10/19/2018   Procedure: REVISION LEFT BELOW KNEE AMPUTATION;  Surgeon: [name], MD;  Location: [place];  Service: Orthopedics;  Laterality: Left;   TUBAL LIGATION  2002     A IV Location/Drains/Wounds Patient Lines/Drains/Airways Status     Active Line/Drains/Airways     Name Placement date Placement time Site Days   Peripheral IV 06/27/22 22 G 2.5" Anterior;Right;Upper Arm 06/27/22  1600  Arm  less than 1   Fistula / Graft Left Forearm Arteriovenous fistula 04/14/19  1642  Forearm  1170   Fistula / Graft Left Upper arm --  --  Upper arm  --   Negative Pressure Wound Therapy 04/18/21  1444  --  435   Negative Pressure Wound Therapy Leg Anterior;Right 05/20/21  0000  --  403   Incision (Closed)  04/14/19 Arm Left 04/14/19  1304  -- 1170   Incision (Closed) 01/31/21 Leg Right 01/31/21  1143  -- 512   Incision (Closed) 03/28/21 Thigh Right 03/28/21  0835  -- 456   Incision (Closed) 04/18/21 Leg Right 04/18/21  1440  -- 435            Intake/Output Last 24 hours No intake or output data in the 24 hours ending 06/27/22 1916  Labs/Imaging Results for orders placed or performed during the hospital encounter of 06/27/22 (from the past 48 hour(s))  CBC with Differential     Status: Abnormal   Collection Time: 06/27/22  9:14 AM  Result Value Ref Range   WBC 7.8 4.0 - 10.5 K/uL   RBC 2.14 (L) 3.87 - 5.11 MIL/uL   Hemoglobin 7.0 (L) 12.0 - 15.0 g/dL    Comment: REPEATED TO VERIFY   HCT 21.0 (L) 36.0 - 46.0 %   MCV 98.1 80.0 - 100.0 fL   MCH 32.7 26.0 - 34.0 pg   MCHC 33.3 30.0 - 36.0 g/dL   RDW 16.0 (H) 11.5  - 15.5 %   Platelets 323 150 - 400 K/uL   nRBC 0.0 0.0 - 0.2 %   Neutrophils Relative % 73 %   Neutro Abs 5.6 1.7 - 7.7 K/uL   Lymphocytes Relative 14 %   Lymphs Abs 1.1 0.7 - 4.0 K/uL   Monocytes Relative 10 %   Monocytes Absolute 0.8 0.1 - 1.0 K/uL   Eosinophils Relative 2 %   Eosinophils Absolute 0.2 0.0 - 0.5 K/uL   Basophils Relative 1 %   Basophils Absolute 0.1 0.0 - 0.1 K/uL   Immature Granulocytes 0 %   Abs Immature Granulocytes 0.03 0.00 - 0.07 K/uL    Comment: Performed at [facility], [address], [place], [place] [address]  CBC     Status: Abnormal   Collection Time: 06/27/22  1:00 PM  Result Value Ref Range   WBC 8.5 4.0 - 10.5 K/uL   RBC 2.15 (L) 3.87 - 5.11 MIL/uL   Hemoglobin 6.7 (LL) 12.0 - 15.0 g/dL    Comment: REPEATED TO VERIFY THIS CRITICAL RESULT HAS VERIFIED AND BEEN CALLED TO A. Ryker Pherigo RN BY BONNIE DAVIS ON 11 18 2023 AT 1658, AND HAS BEEN READ BACK.     HCT 20.5 (L) 36.0 - 46.0 %   MCV 95.3 80.0 - 100.0 fL   MCH 31.2 26.0 - 34.0 pg   MCHC 32.7 30.0 - 36.0 g/dL   RDW 16.0 (H) 11.5 - 15.5 %   Platelets 353 150 - 400 K/uL   nRBC 0.0 0.0 - 0.2 %    Comment: Performed at [facility] [address], [place], [place] [address]  Basic metabolic panel     Status: Abnormal   Collection Time: 06/27/22  2:35 PM  Result Value Ref Range   Sodium 137 135 - 145 mmol/L   Potassium 5.0 3.5 - 5.1 mmol/L   Chloride 89 (L) 98 - 111 mmol/L   CO2 21 (L) 22 - 32 mmol/L   Glucose, Bld 141 (H) 70 - 99 mg/dL    Comment: Glucose reference range applies only to samples taken after fasting for at least 8 hours.   BUN 71 (H) 6 - 20 mg/dL   Creatinine, Ser 8.96 (H) 0.44 - 1.00 mg/dL   Calcium 7.7 (L) 8.9 - 10.3 mg/dL   GFR, Estimated 5 (L) >60 mL/min  Comment: (NOTE) Calculated using the CKD-EPI Creatinine Equation (2021)    Anion gap 27 (H) 5 - 15    Comment: Electrolytes repeated to confirm. Performed at [facility], [place] [address]  [place], [place] [address]   Protime-INR     Status: None   Collection Time: 06/27/22  5:56 PM  Result Value Ref Range   Prothrombin Time 14.7 11.4 - 15.2 seconds   INR 1.2 0.8 - 1.2    Comment: (NOTE) INR goal varies based on device and disease states. Performed at [facility], [place] [address] [place], [place] [address]   APTT     Status: None   Collection Time: 06/27/22  5:56 PM  Result Value Ref Range   aPTT 29 24 - 36 seconds    Comment: Performed at [facility] [address] [place], [place] [address]  Type and screen [place]     Status: None (Preliminary result)   Collection Time: 06/27/22  6:06 PM  Result Value Ref Range   ABO/RH(D) B POS    Antibody Screen NEG    Sample Expiration      06/30/2022,2359 Performed at [facility], [place] [address], [place], [place] [address]    Unit Number [id number]    Blood Component Type RBC LR PHER2    Unit division 00    Status of Unit ALLOCATED    Transfusion Status OK TO TRANSFUSE    Crossmatch Result Compatible    No results found.  Pending Labs Unresulted Labs (From admission, onward)     Start     Ordered   06/28/22 0500  CBC  Tomorrow morning,   R       Question:  Specimen collection method  Answer:  IV Team=IV Team collect   06/27/22 1831   06/28/22 3151  Basic metabolic panel  Tomorrow morning,   R       Question:  Specimen collection method  Answer:  IV Team=IV Team collect   06/27/22 1831   06/27/22 1710  Prepare RBC (crossmatch)  (Adult Blood Administration - PRBC)  Once,   R       Question Answer Comment  # of Units 1 unit   Transfusion Indications Hemoglobin < 7 gm/dL and symptomatic   Number of Units to Keep Ahead NO units ahead   If emergent release call blood bank Not emergent release      06/27/22 1710            Vitals/Pain Today's Vitals   06/27/22 1115 06/27/22 1215 06/27/22 1421 06/27/22 1752  BP: 127/73 (!) 151/135 (!) 177/81   Pulse: 92 93 88 89   Resp:   18   Temp:      SpO2: 97% 99% 100% 100%  PainSc:        Isolation Precautions No active isolations  Medications Medications  sodium chloride 0.9 % bolus 500 mL (has no administration in time range)  0.9 %  sodium chloride infusion (has no administration in time range)  amoxicillin-clavulanate (AUGMENTIN) 500-125 MG per tablet 1 tablet (has no administration in time range)  acetaminophen (TYLENOL) tablet 650 mg (has no administration in time range)  oxymetazoline (AFRIN) 0.05 % nasal spray 1 spray (1 spray Each Nare Given 06/27/22 0917)  fentaNYL (SUBLIMAZE) injection 50 mcg (50 mcg Intramuscular Given 06/27/22 1224)  acetaminophen (TYLENOL) tablet 1,000 mg (1,000 mg Oral Given 06/27/22 1218)    Mobility non-ambulatory Low  fall risk   Focused Assessments Renal Assessment Handoff:  Hemodialysis Schedule:  Last Hemodialysis date and time:    Restricted appendage: left arm   R Recommendations: See Admitting Provider Note  Report given to:   Additional Notes:

## 2022-06-28 LAB — CBC
HCT: 21.4 % — ABNORMAL LOW (ref 36.0–46.0)
Hemoglobin: 7.2 g/dL — ABNORMAL LOW (ref 12.0–15.0)
MCH: 31.3 pg (ref 26.0–34.0)
MCHC: 33.6 g/dL (ref 30.0–36.0)
MCV: 93 fL (ref 80.0–100.0)
Platelets: 327 10*3/uL (ref 150–400)
RBC: 2.3 MIL/uL — ABNORMAL LOW (ref 3.87–5.11)
RDW: 17.2 % — ABNORMAL HIGH (ref 11.5–15.5)
WBC: 7.8 10*3/uL (ref 4.0–10.5)
nRBC: 0 % (ref 0.0–0.2)

## 2022-06-28 LAB — GLUCOSE, CAPILLARY
Glucose-Capillary: 231 mg/dL — ABNORMAL HIGH (ref 70–99)
Glucose-Capillary: 275 mg/dL — ABNORMAL HIGH (ref 70–99)
Glucose-Capillary: 173 mg/dL — ABNORMAL HIGH (ref 70–99)

## 2022-06-28 LAB — BASIC METABOLIC PANEL
Anion gap: 24 — ABNORMAL HIGH (ref 5–15)
BUN: 81 mg/dL — ABNORMAL HIGH (ref 6–20)
CO2: 22 mmol/L (ref 22–32)
Calcium: 7.6 mg/dL — ABNORMAL LOW (ref 8.9–10.3)
Chloride: 92 mmol/L — ABNORMAL LOW (ref 98–111)
Creatinine, Ser: 9.83 mg/dL — ABNORMAL HIGH (ref 0.44–1.00)
GFR, Estimated: 5 mL/min — ABNORMAL LOW (ref >60)
Glucose, Bld: 94 mg/dL (ref 70–99)
Potassium: 4.4 mmol/L (ref 3.5–5.1)
Sodium: 138 mmol/L (ref 135–145)

## 2022-06-28 MED ORDER — HYDROMORPHONE HCL 1 MG/ML IJ SOLN
0.5000 mg | Freq: Once | INTRAMUSCULAR | Status: AC
  Administered 2022-06-28: 1 mg via INTRAVENOUS
  Filled 2022-06-28: qty 1

## 2022-06-28 MED ORDER — HYDRALAZINE HCL 20 MG/ML IJ SOLN
INTRAMUSCULAR | Status: AC
  Filled 2022-06-28: qty 1

## 2022-06-28 MED ORDER — ACETAMINOPHEN 500 MG PO TABS
1000.0000 mg | ORAL_TABLET | Freq: Once | ORAL | Status: AC
  Administered 2022-06-28: 1000 mg via ORAL
  Filled 2022-06-28: qty 2

## 2022-06-28 MED ORDER — DIPHENHYDRAMINE HCL 25 MG PO CAPS
25.0000 mg | ORAL_CAPSULE | Freq: Once | ORAL | Status: AC
  Administered 2022-06-28: 25 mg via ORAL
  Filled 2022-06-28: qty 1

## 2022-06-28 MED ORDER — CHLORHEXIDINE GLUCONATE CLOTH 2 % EX PADS
6.0000 | MEDICATED_PAD | Freq: Every day | CUTANEOUS | Status: DC
  Administered 2022-06-29 – 2022-06-30 (×2): 6 via TOPICAL

## 2022-06-28 MED ORDER — ACETAMINOPHEN 325 MG PO TABS
ORAL_TABLET | ORAL | Status: AC
  Filled 2022-06-28: qty 1

## 2022-06-28 MED ORDER — HYDRALAZINE HCL 20 MG/ML IJ SOLN
10.0000 mg | Freq: Once | INTRAMUSCULAR | Status: AC
  Administered 2022-06-28: 10 mg via INTRAVENOUS

## 2022-06-28 MED ORDER — BUTALBITAL-APAP-CAFFEINE 50-325-40 MG PO TABS
1.0000 | ORAL_TABLET | Freq: Once | ORAL | Status: AC
  Administered 2022-06-28: 1 via ORAL
  Filled 2022-06-28: qty 1

## 2022-06-28 NOTE — Procedures (Signed)
   I was present at this dialysis session, have reviewed the session itself and made  appropriate changes Kelly Splinter MD [place] pager [phone]   06/28/2022, 5:51 PM

## 2022-06-28 NOTE — Hospital Course (Addendum)
Kathy Frank is a 45 year old female with a history of ESRD on HD, CAD s/p stent in Oct. 2022, CVA, HIV, HTN, and T2DM who presented to the emergency department with a persistent nosebleed lasting over 24 hours that was caused by digital trauma. Due the nosebleed, she had missed her regularly scheduled dialysis that day. She was given afrin and a rhino rocket was placed with subsequent resolution of bleeding by the following day. Hemoglobin was noted to be low and required two units of PRBCs of the course of two days to remain above 7 and stable. Patient is chronically anemic due to ESRD. Nephrology was consulted and she received inpatient hemodialysis on 11/19 and 11/20. They also increased her dose of ESA to reduce anemia. During her stay, she also complained of migraine with aura and informed our team that she had been taking 2-3 BC powders every day since September to help her continual headache. This likely contributed to her significant nosebleed. Due to prior CVA, patient is ineligible for triptan use. Promethazine and equivalents are also contraindicated due to her prolonged QT interval. Medication overuse also likely contributes to her continual headache. She was given benadryl with some improvement. She was instructed to stop using BC powders and to follow up in the clinic to pursue prior authorization for Nurtec. The patient also felt as though her high blood pressures were contributing to her headaches. Blood pressure remained elevated in the inpatient setting. She was advised to continue to her home losartan and amlodipine as prescribed by nephrology and to keep a log of her blood pressures at home so she can discuss treatment options with them at her next appointment. Throughout her stay, she also received a continuation of her previously prescribed Augmentin for a pneumonia that was diagnosed in the outpatient setting prior to her arrival. She has not received a dose for 11/21 and will need to take  today's dose later this evening. She will also receive two more daily doses to finish her 7-day course of antibiotics. Patient's history and exam was concerning for constipation. She states she frequently takes imodium on dialysis days to prevent incontinence, but that she had been constipated prior to her admission. Moderate stool burden was visualized on xray. Miralax was provided and patient had one bowel movement this morning. She was instructed to continue taking miralax and to stop taking imodium during dialysis until constipation resolves.

## 2022-06-28 NOTE — Plan of Care (Signed)
  Problem: Education: Goal: Knowledge of General Education information will improve Description: Including pain rating scale, medication(s)/side effects and non-pharmacologic comfort measures Outcome: Progressing   Problem: Clinical Measurements: Goal: Ability to maintain clinical measurements within normal limits will improve Outcome: Progressing Goal: Will remain free from infection Outcome: Progressing Goal: Diagnostic test results will improve Outcome: Progressing   Problem: Activity: Goal: Risk for activity intolerance will decrease Outcome: Progressing   Problem: Nutrition: Goal: Adequate nutrition will be maintained Outcome: Progressing   Problem: Coping: Goal: Level of anxiety will decrease Outcome: Progressing   Problem: Elimination: Goal: Will not experience complications related to bowel motility Outcome: Progressing   Problem: Safety: Goal: Ability to remain free from injury will improve Outcome: Progressing   Problem: Skin Integrity: Goal: Risk for impaired skin integrity will decrease Outcome: Progressing   Problem: Pain Managment: Goal: General experience of comfort will improve Outcome: Progressing   Problem: Safety: Goal: Ability to remain free from injury will improve Outcome: Progressing   Problem: Skin Integrity: Goal: Risk for impaired skin integrity will decrease Outcome: Progressing

## 2022-06-28 NOTE — Progress Notes (Addendum)
Subjective:  Kathy Frank was awake and sitting upright in bed without support. She appeared fatigued and stated she did not rest well last night due to her headache that has continued into this morning. She states she has a history of migraines with aura, but that she only takes [facility] powders for them. She states that while taking the [facility] powders, she does not take her usual low dose aspirin. She states she has been off of her Plavix for a couple of months. She states she was taking 2-3 BC powders every day or every other day for in the weeks prior to presenting to the ED with a nose bleed that had lasted for >12 hours. Nose bleed likely due to digital trauma. She reports that she has been clearing clots from the back of her throat throughout the night and into this morning. The rhino rocket from yesterday was still in place without any apparent surrounding bleeding. Patient feels congested and believes pressure from her rhino rocket may be contributing to her headache.   Objective:  Vital signs in last 24 hours: Vitals:   06/27/22 2300 06/28/22 0014 06/28/22 0600 06/28/22 0800  BP: (!) 141/82 138/74 (!) 144/79 (!) 111/90  Pulse: 89 82 84 86  Resp: 18 18 18 17   Temp: 98.2 F (36.8 C) 98.1 F (36.7 C) 98.2 F (36.8 C) 97.9 F (36.6 C)  TempSrc: Oral Oral Oral Oral  SpO2: 100% 100% 100% 100%   Weight change:   Intake/Output Summary (Last 24 hours) at 06/28/2022 2993 Last data filed at 06/28/2022 0220 Gross per 24 hour  Intake 1043 ml  Output --  Net 1043 ml   Physical Exam Constitutional:      General: She is not in acute distress.    Appearance: She is ill-appearing.  HENT:     Nose: Congestion present. No nasal deformity or signs of injury.     Right Nostril: Foreign body (rhino rocket in place without obvious bleeding) present.  Cardiovascular:     Rate and Rhythm: Normal rate and regular rhythm.  Pulmonary:     Effort: Pulmonary effort is normal.     Breath sounds: No  wheezing, rhonchi or rales.  Musculoskeletal:     Comments: S/p left AKA and right BKA  Skin:    General: Skin is warm and dry.  Neurological:     Mental Status: She is alert and oriented to person, place, and time.     Comments: Appeared fatigued    Results for orders placed or performed during the hospital encounter of 06/27/22 (from the past 24 hour(s))  CBC     Status: Abnormal   Collection Time: 06/27/22  1:00 PM  Result Value Ref Range   WBC 8.5 4.0 - 10.5 K/uL   RBC 2.15 (L) 3.87 - 5.11 MIL/uL   Hemoglobin 6.7 (LL) 12.0 - 15.0 g/dL   HCT 20.5 (L) 36.0 - 46.0 %   MCV 95.3 80.0 - 100.0 fL   MCH 31.2 26.0 - 34.0 pg   MCHC 32.7 30.0 - 36.0 g/dL   RDW 16.0 (H) 11.5 - 15.5 %   Platelets 353 150 - 400 K/uL   nRBC 0.0 0.0 - 0.2 %  Basic metabolic panel     Status: Abnormal   Collection Time: 06/27/22  2:35 PM  Result Value Ref Range   Sodium 137 135 - 145 mmol/L   Potassium 5.0 3.5 - 5.1 mmol/L   Chloride 89 (L) 98 - 111 mmol/L  CO2 21 (L) 22 - 32 mmol/L   Glucose, Bld 141 (H) 70 - 99 mg/dL   BUN 71 (H) 6 - 20 mg/dL   Creatinine, Ser 8.96 (H) 0.44 - 1.00 mg/dL   Calcium 7.7 (L) 8.9 - 10.3 mg/dL   GFR, Estimated 5 (L) >60 mL/min   Anion gap 27 (H) 5 - 15  Albumin     Status: Abnormal   Collection Time: 06/27/22  2:35 PM  Result Value Ref Range   Albumin 2.7 (L) 3.5 - 5.0 g/dL  Phosphorus     Status: Abnormal   Collection Time: 06/27/22  2:35 PM  Result Value Ref Range   Phosphorus 8.0 (H) 2.5 - 4.6 mg/dL  Prepare RBC (crossmatch)     Status: None   Collection Time: 06/27/22  5:10 PM  Result Value Ref Range   Order Confirmation      ORDER PROCESSED BY BLOOD BANK Performed at [facility], [address] [place], [place] [address]   Protime-INR     Status: None   Collection Time: 06/27/22  5:56 PM  Result Value Ref Range   Prothrombin Time 14.7 11.4 - 15.2 seconds   INR 1.2 0.8 - 1.2  APTT     Status: None   Collection Time: 06/27/22  5:56 PM  Result  Value Ref Range   aPTT 29 24 - 36 seconds  Type and screen [place]     Status: None (Preliminary result)   Collection Time: 06/27/22  6:06 PM  Result Value Ref Range   ABO/RH(D) B POS    Antibody Screen NEG    Sample Expiration 06/30/2022,2359    Unit Number [id number]    Blood Component Type RBC LR PHER2    Unit division 00    Status of Unit ISSUED    Transfusion Status OK TO TRANSFUSE    Crossmatch Result      Compatible Performed at [facility], [place] [address] [place], [place] [address]   Glucose, capillary     Status: Abnormal   Collection Time: 06/27/22  9:38 PM  Result Value Ref Range   Glucose-Capillary 350 (H) 70 - 99 mg/dL  CBC     Status: Abnormal   Collection Time: 06/28/22  2:24 AM  Result Value Ref Range   WBC 7.8 4.0 - 10.5 K/uL   RBC 2.30 (L) 3.87 - 5.11 MIL/uL   Hemoglobin 7.2 (L) 12.0 - 15.0 g/dL   HCT 21.4 (L) 36.0 - 46.0 %   MCV 93.0 80.0 - 100.0 fL   MCH 31.3 26.0 - 34.0 pg   MCHC 33.6 30.0 - 36.0 g/dL   RDW 17.2 (H) 11.5 - 15.5 %   Platelets 327 150 - 400 K/uL   nRBC 0.0 0.0 - 0.2 %  Basic metabolic panel     Status: Abnormal   Collection Time: 06/28/22  2:24 AM  Result Value Ref Range   Sodium 138 135 - 145 mmol/L   Potassium 4.4 3.5 - 5.1 mmol/L   Chloride 92 (L) 98 - 111 mmol/L   CO2 22 22 - 32 mmol/L   Glucose, Bld 94 70 - 99 mg/dL   BUN 81 (H) 6 - 20 mg/dL   Creatinine, Ser 9.83 (H) 0.44 - 1.00 mg/dL   Calcium 7.6 (L) 8.9 - 10.3 mg/dL   GFR, Estimated 5 (L) >60 mL/min   Anion gap 24 (H) 5 - 15  Glucose, capillary  Status: Abnormal   Collection Time: 06/28/22  7:56 AM  Result Value Ref Range   Glucose-Capillary 231 (H) 70 - 99 mg/dL      Assessment/Plan:  Principal Problem:   Epistaxis  Persistent Epistaxis Patient reports continued clot clearance from the back of her throat. Possible she may still be bleeding behind the rhino rocket as she states she has continued to clear clots from her throat  throughout the night and this morning. Will remove rhino rocket and assess for continued bleeding. If bleeding continues, will notify ENT. Patient received 1 unit PRBCs. Hgb improved to 7.2. Hct 21.4. Patient appears to have chronic anemia, likely due to ESRD, with typical Hgb ranging from 8.5-11.3 within the past year. Patient frequently takes [facility] powders for her migraines, which has likely contributed to her persistent nosebleed.  - Remove rhino rocket and monitor for continued bleeding - Optimize migraine management to reduce patient use of BC powders - ENT consulted and we appreciate their time and input  Pneumonia Patient diagnosed with pneumonia recently. Continues to sat well on room air.  - Continue Augmentin 500-125mg  QD at bedtime (day 3 of 7) - Continue to monitor oxygen status, supplemental O2 if necessary  Migraine with Aura Patient reports use of 2-3 BC powders every day or every other day for migraines. She also describes having blurry vision with her migraines. States she has a headache almost every day. Current headache remains from last night. Feels as though pressure form rhino rocket may be contributing. Possible rebound contribution from frequent BC powder use. Elevated BP overnight may have also contributed. Prochlorperazine contraindicated due to prolonged QT. Triptans contraindicated due to history of CVA. NSAIDs contraindicated due to bleeding. - Remove rhino rocket, d/c BC powders, control BP, and provide dialysis. Assess for improvement of headache. - Benadryl 25mg  PO once to help sleep with her migraine - Recommend follow-up outpatient for prescription therapy for migraine control  End Stage Renal Disease on TTS Hemodialysis Patient scheduled for hemodialysis today. BUN 81, Cr. 9.83. Both elevated from yesterday. - Will work with nephrology to get patient back on regular HD schedule - Continue calcitriol for HD days  Hypertension Cerebrovascular Accident Coronary  Artery Disease s/p Stent Peripheral Artery Disease Patient's BP elevated to 177/81 overnight. Improved significantly with administration of amlodipine. 111/90 this morning. Stent placed in October 2022. History of remote cerebrovascular infarcts.  Home medications include amlodipine 10mg  QD. May have stopped taking hydralazine and losartan. - Continue amlodipine. May consider splitting dose between AM and PM or starting another medication if nightly elevations continue.  - Recommend follow-up outpatient for continued management.  HIV Stable on Biktarvy. - Continue Biktarvy  Insulin-Dependent Type 2 Diabetes Mellitus Fasting blood glucose >200 today. Home medications include liraglutide 06mg  QD and Lantus 15 units at bedtime. - Continue sliding scale insulin   Best Practices: Diet: Renal/Carb Modified with Fluid Restriction Fluids: None VTE Prophylaxis: None Antibiotics: Augmentin (day 3 of 7) Code Status: Full Code   LOS: 0 days   Dolores Frame, Medical Student 06/28/2022, 9:39 AM   --   Kathy Frank is a 46 year old female with past medical history CAD s/p stent, CVA, PAD, HTN, ESRD on dialysis, HIV, insulin-dependent type 2 diabetes presenting for epistaxis.   She received 1 unit PRBC overnight 11/18 with improvement of Hgb to 7.2. Today her main complaints are persistent headache despite conservative therapy overnight. On further questioning she endorses taking 2-3 goody powders daily for headaches. She is not  currently on migraine prophylaxis and says her nephrologist took her off if a prior medication due to concerns that it was not safe for her to take. Rhinorocket remains in place in the R nare and there is no bleeding around it. She does report having blood clots that she has been swallowing but no rapid bleeding.  I suspect that her current headache is due to medication overuse and unfortunately there is no great therapy especially in the setting of prior CVA and CAD  and prolonged QTc. Having the Rhinorocket in place has also caused increase facial pressure which surely is not helping with her headache. At this time she has benadryl ordered however treatment is going to be avoidance of OTC pain therapies.  We will monitor for signs of recurrent epistaxis after removing the Rhinorocket. Will plan to try afrin nasal spray if there is significant recurrence, and if that does not help, will replace Rhinorocket and reconsult ENT. I suspect the bleed was traumatic from her picking at her nose combined with uremia and existing anemia in setting of ESRD. We will trend CBC and transfuse if necessary.  She will undergo HD today off schedule after missing her session on 11/18.  Remainder of concerns as detailed by Student Doctor Trinidad Curet.  Attestation for Student Documentation: I personally was present and performed or re-performed the history, physical exam and medical decision-making activities of this service and have verified that the service and findings are accurately documented in the student's note.  Farrel Gordon, DO 06/28/2022, 11:30 AM

## 2022-06-29 ENCOUNTER — Observation Stay (HOSPITAL_COMMUNITY): Payer: Medicare Other

## 2022-06-29 ENCOUNTER — Other Ambulatory Visit (HOSPITAL_COMMUNITY): Payer: Self-pay

## 2022-06-29 DIAGNOSIS — G43109 Migraine with aura, not intractable, without status migrainosus: Secondary | ICD-10-CM | POA: Insufficient documentation

## 2022-06-29 DIAGNOSIS — D649 Anemia, unspecified: Secondary | ICD-10-CM | POA: Diagnosis not present

## 2022-06-29 DIAGNOSIS — K5900 Constipation, unspecified: Secondary | ICD-10-CM | POA: Diagnosis not present

## 2022-06-29 LAB — BASIC METABOLIC PANEL
Anion gap: 15 (ref 5–15)
BUN: 26 mg/dL — ABNORMAL HIGH (ref 6–20)
CO2: 27 mmol/L (ref 22–32)
Calcium: 8.2 mg/dL — ABNORMAL LOW (ref 8.9–10.3)
Chloride: 93 mmol/L — ABNORMAL LOW (ref 98–111)
Creatinine, Ser: 5 mg/dL — ABNORMAL HIGH (ref 0.44–1.00)
GFR, Estimated: 10 mL/min — ABNORMAL LOW (ref >60)
Glucose, Bld: 216 mg/dL — ABNORMAL HIGH (ref 70–99)
Potassium: 4.2 mmol/L (ref 3.5–5.1)
Sodium: 135 mmol/L (ref 135–145)

## 2022-06-29 LAB — CBC
HCT: 20.2 % — ABNORMAL LOW (ref 36.0–46.0)
Hemoglobin: 6.7 g/dL — CL (ref 12.0–15.0)
MCH: 31.2 pg (ref 26.0–34.0)
MCHC: 33.2 g/dL (ref 30.0–36.0)
MCV: 94 fL (ref 80.0–100.0)
Platelets: 358 10*3/uL (ref 150–400)
RBC: 2.15 MIL/uL — ABNORMAL LOW (ref 3.87–5.11)
RDW: 17.2 % — ABNORMAL HIGH (ref 11.5–15.5)
WBC: 5.3 10*3/uL (ref 4.0–10.5)
nRBC: 0 % (ref 0.0–0.2)

## 2022-06-29 LAB — CBC WITH DIFFERENTIAL/PLATELET
Abs Immature Granulocytes: 0.03 10*3/uL (ref 0.00–0.07)
Basophils Absolute: 0.1 10*3/uL (ref 0.0–0.1)
Basophils Relative: 1 %
Eosinophils Absolute: 0.2 10*3/uL (ref 0.0–0.5)
Eosinophils Relative: 3 %
HCT: 21.9 % — ABNORMAL LOW (ref 36.0–46.0)
Hemoglobin: 7.2 g/dL — ABNORMAL LOW (ref 12.0–15.0)
Immature Granulocytes: 1 %
Lymphocytes Relative: 17 %
Lymphs Abs: 1 10*3/uL (ref 0.7–4.0)
MCH: 30.4 pg (ref 26.0–34.0)
MCHC: 32.9 g/dL (ref 30.0–36.0)
MCV: 92.4 fL (ref 80.0–100.0)
Monocytes Absolute: 0.6 10*3/uL (ref 0.1–1.0)
Monocytes Relative: 9 %
Neutro Abs: 4.1 10*3/uL (ref 1.7–7.7)
Neutrophils Relative %: 69 %
Platelets: 381 10*3/uL (ref 150–400)
RBC: 2.37 MIL/uL — ABNORMAL LOW (ref 3.87–5.11)
RDW: 18.5 % — ABNORMAL HIGH (ref 11.5–15.5)
WBC: 6 10*3/uL (ref 4.0–10.5)
nRBC: 0 % (ref 0.0–0.2)

## 2022-06-29 LAB — GLUCOSE, CAPILLARY
Glucose-Capillary: 202 mg/dL — ABNORMAL HIGH (ref 70–99)
Glucose-Capillary: 141 mg/dL — ABNORMAL HIGH (ref 70–99)
Glucose-Capillary: 195 mg/dL — ABNORMAL HIGH (ref 70–99)
Glucose-Capillary: 339 mg/dL — ABNORMAL HIGH (ref 70–99)

## 2022-06-29 LAB — PREPARE RBC (CROSSMATCH)

## 2022-06-29 MED ORDER — SODIUM CHLORIDE 0.9% IV SOLUTION: Freq: Once | INTRAVENOUS | Status: AC

## 2022-06-29 MED ORDER — ACETAMINOPHEN 325 MG PO TABS
650.0000 mg | ORAL_TABLET | Freq: Once | ORAL | Status: AC
  Administered 2022-06-29: 650 mg via ORAL
  Filled 2022-06-29: qty 2

## 2022-06-29 MED ORDER — DIPHENHYDRAMINE HCL 25 MG PO CAPS
25.0000 mg | ORAL_CAPSULE | Freq: Once | ORAL | Status: AC
  Administered 2022-06-29: 25 mg via ORAL
  Filled 2022-06-29: qty 1

## 2022-06-29 MED ORDER — POLYETHYLENE GLYCOL 3350 17 G PO PACK
17.0000 g | PACK | Freq: Every day | ORAL | Status: DC
  Administered 2022-06-30: 17 g via ORAL
  Filled 2022-06-29 (×3): qty 1

## 2022-06-29 MED ORDER — DARBEPOETIN ALFA 200 MCG/0.4ML IJ SOSY
200.0000 ug | PREFILLED_SYRINGE | INTRAMUSCULAR | Status: DC
  Administered 2022-06-29: 200 ug via SUBCUTANEOUS
  Filled 2022-06-29: qty 0.4

## 2022-06-29 MED ORDER — CALCIUM ACETATE (PHOS BINDER) 667 MG PO CAPS
1334.0000 mg | ORAL_CAPSULE | Freq: Three times a day (TID) | ORAL | Status: DC
  Administered 2022-06-29 – 2022-06-30 (×3): 1334 mg via ORAL
  Filled 2022-06-29 (×3): qty 2

## 2022-06-29 MED ORDER — HYDRALAZINE HCL 50 MG PO TABS
50.0000 mg | ORAL_TABLET | Freq: Four times a day (QID) | ORAL | Status: DC
  Administered 2022-06-29 – 2022-06-30 (×4): 50 mg via ORAL
  Filled 2022-06-29 (×5): qty 1

## 2022-06-29 MED ORDER — INSULIN GLARGINE-YFGN 100 UNIT/ML ~~LOC~~ SOLN
8.0000 [IU] | Freq: Every day | SUBCUTANEOUS | Status: DC
  Administered 2022-06-29: 8 [IU] via SUBCUTANEOUS
  Filled 2022-06-29 (×2): qty 0.08

## 2022-06-29 NOTE — Plan of Care (Signed)
  Problem: Education: Goal: Ability to describe self-care measures that may prevent or decrease complications (Diabetes Survival Skills Education) will improve Outcome: Progressing Goal: Individualized Educational Video(s) Outcome: Progressing   Problem: Coping: Goal: Ability to adjust to condition or change in health will improve Outcome: Progressing   Problem: Fluid Volume: Goal: Ability to maintain a balanced intake and output will improve Outcome: Progressing   Problem: Skin Integrity: Goal: Risk for impaired skin integrity will decrease Outcome: Progressing   Problem: Education: Goal: Knowledge of General Education information will improve Description: Including pain rating scale, medication(s)/side effects and non-pharmacologic comfort measures Outcome: Progressing   Problem: Nutrition: Goal: Adequate nutrition will be maintained Outcome: Progressing   Problem: Coping: Goal: Level of anxiety will decrease Outcome: Progressing   Problem: Pain Managment: Goal: General experience of comfort will improve Outcome: Progressing   Problem: Safety: Goal: Ability to remain free from injury will improve Outcome: Progressing   Problem: Skin Integrity: Goal: Risk for impaired skin integrity will decrease Outcome: Progressing   Problem: Clinical Measurements: Goal: Will remain free from infection Outcome: Progressing   Problem: Pain Managment: Goal: General experience of comfort will improve Outcome: Progressing   Problem: Safety: Goal: Ability to remain free from injury will improve Outcome: Progressing   Problem: Skin Integrity: Goal: Risk for impaired skin integrity will decrease Outcome: Progressing

## 2022-06-29 NOTE — Inpatient Diabetes Management (Signed)
Inpatient Diabetes Program Recommendations  AACE/ADA: New Consensus Statement on Inpatient Glycemic Control (2015)  Target Ranges:  Prepandial:   less than 140 mg/dL      Peak postprandial:   less than 180 mg/dL (1-2 hours)      Critically ill patients:  140 - 180 mg/dL   Lab Results  Component Value Date   GLUCAP 202 (H) 06/29/2022   HGBA1C 8.0 (H) 11/10/2021    Review of Glycemic Control  Latest Reference Range & Units 06/28/22 07:56 06/28/22 12:06 06/28/22 22:27 06/29/22 07:41  Glucose-Capillary 70 - 99 mg/dL 231 (H) 275 (H) 173 (H) 202 (H)   Diabetes history: DM 2 Outpatient Diabetes medications: Lantus 15 units, Victoza 0.6 mg weekly, Dexcom CGM Current orders for Inpatient glycemic control:  Novolog 0-9 units tid + hs  Inpatient Diabetes Program Recommendations:    -  Start Semglee 8 units (1/2 home dose)  Thanks,  Tama Headings RN, MSN, BC-ADM Inpatient Diabetes Coordinator Team Pager [phone] (8a-5p)

## 2022-06-29 NOTE — Progress Notes (Signed)
[facility] Progress Note   Subjective:  Seen in room. No nose bleeding today. No CP/dyspnea. Dialyzed yesterday - only 1L off. Due for HD today again d/t Holiday schedule.  Objective Vitals:   06/29/22 0537 06/29/22 0647 06/29/22 0714 06/29/22 0937  BP: (!) 159/77 (!) 158/129 (!) 142/66 (!) 175/79  Pulse: 94 82 91 100  Resp:  18 20 18   Temp:  98.9 F (37.2 C) 99 F (37.2 C) (!) 97.4 F (36.3 C)  TempSrc:  Oral Oral Oral  SpO2: 100% 100% 100%   Weight:       Physical Exam General: Well appearing woman, NAD. Room air. Heart: RRR; no murmur Lungs: CTAB; no rales Abdomen: soft Extremities: B BKA without edema Dialysis Access: L AVF + bruit  Additional Objective Labs: Basic Metabolic Panel: Recent Labs  Lab 06/27/22 1435 06/28/22 0224 06/29/22 0325  NA 137 138 135  K 5.0 4.4 4.2  CL 89* 92* 93*  CO2 21* 22 27  GLUCOSE 141* 94 216*  BUN 71* 81* 26*  CREATININE 8.96* 9.83* 5.00*  CALCIUM 7.7* 7.6* 8.2*  PHOS 8.0*  --   --    Liver Function Tests: Recent Labs  Lab 06/27/22 1435  ALBUMIN 2.7*   CBC: Recent Labs  Lab 06/25/22 1632 06/27/22 0914 06/27/22 1300 06/28/22 0224 06/29/22 0325  WBC 8.0 7.8 8.5 7.8 5.3  NEUTROABS 6.3 5.6  --   --   --   HGB 8.5* 7.0* 6.7* 7.2* 6.7*  HCT 25.9* 21.0* 20.5* 21.4* 20.2*  MCV 96.6 98.1 95.3 93.0 94.0  PLT 271 323 353 327 358   Blood Culture    Component Value Date/Time   SDES  06/25/2022 1638    BLOOD RIGHT HAND BOTTLES DRAWN AEROBIC AND ANAEROBIC Blood Culture results may not be optimal due to an excessive volume of blood received in culture bottles   SPECREQUEST NONE 06/25/2022 1638   CULT  06/25/2022 1638    NO GROWTH 4 DAYS Performed at [facility], [place] [address], [place], [place] [address]    REPTSTATUS PENDING 06/25/2022 1638   Studies/Results: DG Abd 1 View  Result Date: 06/29/2022 CLINICAL DATA:  Constipation EXAM: ABDOMEN - 1 VIEW COMPARISON:  CT 12/31/2018 FINDINGS:  Nonobstructive bowel gas pattern. Moderate stool burden. Vascular calcifications. No acute osseous abnormality. IMPRESSION: No evidence of bowel obstruction.  Moderate stool burden. Electronically Signed   By: Maurine Simmering M.D.   On: 06/29/2022 11:46    Medications:  sodium chloride      amLODipine  10 mg Oral Daily   amoxicillin-clavulanate  1 tablet Oral QHS   bictegravir-emtricitabine-tenofovir AF  1 tablet Oral Daily   [START ON 06/30/2022] calcitRIOL  1.25 mcg Oral Q T,Th,Sa-HD   Chlorhexidine Gluconate Cloth  6 each Topical Q0600   Chlorhexidine Gluconate Cloth  6 each Topical Q0600   insulin aspart  0-5 Units Subcutaneous QHS   insulin aspart  0-9 Units Subcutaneous TID WC   melatonin  3 mg Oral QHS    Dialysis Orders: TTS [facility]  4h   400/1.5   74.5kg  2K/3ca bath  Hep none  LUA AVF - last HD 11/16, post wt 74.8kg - venofer 50 weekly - calcitriol 1.25 ug po tiw - mircera 75 ug IV q4, last 11/4  Assessment/Plan: Epistaxis - recurrent issue, here in early November w/ same thing. S/p nasal packing. Per primary team. ESRD: Usual TTS schedule. Dialyzed yesterday as make-up. Due for HD again today  following Thanksgiving Holiday Schedule (Mon/Wed/Sat) - for HD today. No heparin.  HTN/ volume: BP high, UF as tolerated. Anemia of ESRD + ABLA: Hgb 6.7, 2U PRBCs given this admit. Will ^ ESA dose, start today. Secondary HPTH: Ca ok, Phos ^. Continue VDRA, restart home binders (Phoslo) 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 HIV - on Biktarvy IDDM - per pmd  Veneta Penton, PA-C 06/29/2022, 12:19 PM  [facility]

## 2022-06-29 NOTE — Progress Notes (Addendum)
Pt back from HD stable at baseline. Scheduled BP meds and PRN tylenol given per MAR.

## 2022-06-29 NOTE — Plan of Care (Signed)
  Problem: Education: Goal: Ability to describe self-care measures that may prevent or decrease complications (Diabetes Survival Skills Education) will improve Outcome: Progressing   Problem: Coping: Goal: Ability to adjust to condition or change in health will improve Outcome: Progressing   Problem: Fluid Volume: Goal: Ability to maintain a balanced intake and output will improve Outcome: Progressing   Problem: Education: Goal: Knowledge of General Education information will improve Description: Including pain rating scale, medication(s)/side effects and non-pharmacologic comfort measures Outcome: Progressing   Problem: Coping: Goal: Level of anxiety will decrease Outcome: Progressing   Problem: Pain Managment: Goal: General experience of comfort will improve Outcome: Progressing   Problem: Safety: Goal: Ability to remain free from injury will improve Outcome: Progressing   Problem: Skin Integrity: Goal: Risk for impaired skin integrity will decrease Outcome: Progressing

## 2022-06-29 NOTE — Progress Notes (Signed)
Received patient in bed to unit.  Alert and oriented.  Informed consent signed and in chart.   Treatment initiated: 1342 Treatment completed: 1541  Patient tolerated well.  Transported back to the room  Alert, without acute distress.  Hand-off given to patient's nurse.   Access used: AVF Access issues: No  Total UF removed: 1.0 Medication(s) given: Benedryl 25mg  PO Post HD VS: 163/47, 69, 18, 98.9, 100%O2 Post HD weight: 75.6kgs  Patient signed off AMA with 1.5hrs remaining in treatment.  Dr. Joelyn Oms notified.   [facility]

## 2022-06-29 NOTE — Progress Notes (Signed)
Pt  receives out-pt HD at [facility] on TTS 6:40 am chair time. Pt's holiday schedule this week will be MWS at out-pt clinic. Pt uses transportation to/from HD per clinic staff. Will assist as needed.   Melven Sartorius Renal Navigator [phone]

## 2022-06-29 NOTE — Progress Notes (Signed)
Internal Medicine Clinic Attending  Case discussed with Dr. Jinwala  At the time of the visit.  We reviewed the resident's history and exam and pertinent patient test results.  I agree with the assessment, diagnosis, and plan of care documented in the resident's note.  

## 2022-06-29 NOTE — Progress Notes (Cosign Needed Addendum)
Subjective:  Kathy Frank is a 46 year old female who was resting comfortably in bed on our arrival. She stated that she had experienced some dyspnea overnight and required supplemental oxygen. Nasal cannula was present on exam, but no oxygen was flowing. She denied any difficulty breathing at that time, so nasal cannula was removed. She states that she has not noticed any continued bleeding from her right nostril or posterior drainage down her throat and has not been expectorating any further clots. She remains hesitant to blow her nose despite feeling congested. She was encouraged to be gentle to avoid dislodging the clot at this time. She states her headache has improved compared to yesterday and said that she appreciated the benadryl to help her sleep through her migraine. She continues to endorse photophobia with pain along the frontal aspect of her head. She denies fatigue, dizziness, and lightheadedness. She does endorse some persistent bowel incontinence. She states she has been experiencing some constipation recently that has evolved into diarrhea. She denies any dark or bloody stools at this time. States her last bowel movement was a small volume earlier today that was soft in consistency.  Objective:  Vital signs in last 24 hours: Vitals:   06/29/22 1441 06/29/22 1500 06/29/22 1511 06/29/22 1530  BP: (!) 162/79 (!) 198/74 (!) 196/54 (!) 193/61  Pulse: 89 90 93 90  Resp: 15 17 15 16   Temp:      TempSrc:      SpO2:  100%  100%  Weight:       Weight change:   Intake/Output Summary (Last 24 hours) at 06/29/2022 1551 Last data filed at 06/29/2022 1610 Gross per 24 hour  Intake 1210 ml  Output 1000 ml  Net 210 ml    Physical Exam: Constitutional: mild distress as patient did not want to open eyes due to light sensitivity; otherwise alert and conversive HENT: pallor of the oral mucosa and conjunctivae; anterior portion of right nostril appears patent with some evidence of dried  blood along the surrounding area Cardiovascular: regular rate and rhythm; grade 2/6 systolic murmur loudest at right 2nd-3rd intercostal space Pulmonary: lungs clear to auscultation bilaterally; no wheezes, rales, or crackles Abdominal: mildly distended but soft; bowel sounds normo-hyperactive in all quadrants; non-tender to palpation; colon palpable along left side Musculoskeletal: s/p left AKA and right BKA Skin: no evidence of breakdown along sacral region; patient noted discomfort at distal left stump with some surrounding puckering that worsens with edema - no evidence of skin breakdown or ulceration at this site  Results for orders placed or performed during the hospital encounter of 06/27/22 (from the past 24 hour(s))  Glucose, capillary     Status: Abnormal   Collection Time: 06/28/22 10:27 PM  Result Value Ref Range   Glucose-Capillary 173 (H) 70 - 99 mg/dL  CBC     Status: Abnormal   Collection Time: 06/29/22  3:25 AM  Result Value Ref Range   WBC 5.3 4.0 - 10.5 K/uL   RBC 2.15 (L) 3.87 - 5.11 MIL/uL   Hemoglobin 6.7 (LL) 12.0 - 15.0 g/dL   HCT 20.2 (L) 36.0 - 46.0 %   MCV 94.0 80.0 - 100.0 fL   MCH 31.2 26.0 - 34.0 pg   MCHC 33.2 30.0 - 36.0 g/dL   RDW 17.2 (H) 11.5 - 15.5 %   Platelets 358 150 - 400 K/uL   nRBC 0.0 0.0 - 0.2 %  Basic metabolic panel     Status: Abnormal  Collection Time: 06/29/22  3:25 AM  Result Value Ref Range   Sodium 135 135 - 145 mmol/L   Potassium 4.2 3.5 - 5.1 mmol/L   Chloride 93 (L) 98 - 111 mmol/L   CO2 27 22 - 32 mmol/L   Glucose, Bld 216 (H) 70 - 99 mg/dL   BUN 26 (H) 6 - 20 mg/dL   Creatinine, Ser 5.00 (H) 0.44 - 1.00 mg/dL   Calcium 8.2 (L) 8.9 - 10.3 mg/dL   GFR, Estimated 10 (L) >60 mL/min   Anion gap 15 5 - 15  Prepare RBC (crossmatch)     Status: None   Collection Time: 06/29/22  5:42 AM  Result Value Ref Range   Order Confirmation      ORDER PROCESSED BY BLOOD BANK Performed at [facility], [place] [address]  [place], [place] [address]   Glucose, capillary     Status: Abnormal   Collection Time: 06/29/22  7:41 AM  Result Value Ref Range   Glucose-Capillary 202 (H) 70 - 99 mg/dL  Glucose, capillary     Status: Abnormal   Collection Time: 06/29/22 12:01 PM  Result Value Ref Range   Glucose-Capillary 141 (H) 70 - 99 mg/dL  CBC with Differential/Platelet     Status: Abnormal   Collection Time: 06/29/22  1:34 PM  Result Value Ref Range   WBC 6.0 4.0 - 10.5 K/uL   RBC 2.37 (L) 3.87 - 5.11 MIL/uL   Hemoglobin 7.2 (L) 12.0 - 15.0 g/dL   HCT 21.9 (L) 36.0 - 46.0 %   MCV 92.4 80.0 - 100.0 fL   MCH 30.4 26.0 - 34.0 pg   MCHC 32.9 30.0 - 36.0 g/dL   RDW 18.5 (H) 11.5 - 15.5 %   Platelets 381 150 - 400 K/uL   nRBC 0.0 0.0 - 0.2 %   Neutrophils Relative % 69 %   Neutro Abs 4.1 1.7 - 7.7 K/uL   Lymphocytes Relative 17 %   Lymphs Abs 1.0 0.7 - 4.0 K/uL   Monocytes Relative 9 %   Monocytes Absolute 0.6 0.1 - 1.0 K/uL   Eosinophils Relative 3 %   Eosinophils Absolute 0.2 0.0 - 0.5 K/uL   Basophils Relative 1 %   Basophils Absolute 0.1 0.0 - 0.1 K/uL   Immature Granulocytes 1 %   Abs Immature Granulocytes 0.03 0.00 - 0.07 K/uL   Abdominal X-Ray 11/20: IMPRESSION: No evidence of bowel obstruction.  Moderate stool burden.  Assessment/Plan:  Principal Problem:   Epistaxis Active Problems:   Diabetes mellitus type 2 in obese [facility])   Essential hypertension   ESRD (end stage renal disease) (HCC)   Migraine with aura  Persistent Epistaxis, resolved Normocytic Anemia Epistaxis appears to have resolved on exam. Baseline Hgb varies from 9 to 11. Hgb on presentation was 7 and subsequently dropped to 6.7. She received one units of PRBCs which raised to 7.2. Hgb dropped again overnight to 6.7 so she once again received 1 unit PRBCs which again raised Hgb to 7.2. Patient is not experiencing the elevation in Hgb that would be expected with transfusion and is currently not back to her baseline. Originally  thought to be due to epistaxis, now that bleeding is controlled, expect to see a rise in Hgb on tomorrow's labs. Epistaxis was caused by digital trauma and likely exacerbated by frequent BC powder usage. Patient has been encouraged to refrain from continued [facility] powder use. Patient has chronic anemia in the setting of ESRD  and is on Mircera to promote erythropoiesis.  - Darbepoetin Alfa 214mcg per nephrology - Recheck CBC tomorrow morning - Transfuse if below 7 - Optimize migraine management to reduce patient use of BC powders   Migraine with Aura She endorses photophobia and blurry vision with her migraines. Patient states her migraines began after her heart attack in October 2022 and she has been experiencing them daily since that time. She frequently takes 2-3 BC powders every day or every other day to manage her pain. Current headache may have multiple contributing etiologies including rebound headache, elevated blood pressure, and sinus pressure/congestion. Prochlorperazine contraindicated due to prolonged QT. Triptans contraindicated due to history of CVA.  - Nurtec will require prior authorization per pharmacy. Recommend follow-up outpatient to pursue preventative treatment options.  - Repeat benadryl 25mg  PO to help her sleep with her migraine - Optimize BP control to improve symptoms  Pneumonia Patient recently diagnosed with pneumonia in outpatient setting. Despite requiring supplemental oxygen overnight, patient is once again maintaining oxygen saturation on room air.  - Continue Augmentin 500-125mg  QD at bedtime (day 4 of 7) - Continue to monitor oxygen status, supplemental O2 if necessary  End Stage Renal Disease on Hemodialysis Secondary Hyperparathyroidism Patient received dialysis yesterday and is scheduled to receive again today due to the holiday schedule. Usually receives dialysis on Tuesday, Thursday, and Saturday. Cr decreased from 9.83 to 5 today and BUN decreased from 81 to  26. Phosphorus elevated on 11/18. Known hyperparathyroidism due to ESRD being managed outpatient. - Continue calcitriol for HD days  - Phoslo restarted per nephrology for elevated phosphorus - Will need to ensure plan for continued hemodialysis as she is now off her usual cycle and Thanksgiving is coming up this Thursday. - Nephrology consulted and we greatly appreciate their time and input  Hypertension Hx Cerebrovascular Accident Coronary Artery Disease s/p Stent Peripheral Artery Disease Stent placed October 2022. History of remote cerebrovascular infarcts. Patient's BP spiked to 192/93 yesterday and she was given hydralazine with an improvement to 178/81. Patient states she was previously taken off of hydralazine. Most recent prescription refill was for amlodipine 10mg  QD and losartan 50mg  QD. However, all clinic notes show that she should still be on hydralazine. She does not track her BP at home.158/129 this morning. She states she has not been taking low dose aspirin because she knows BC powders have aspirin in them. Also states she has not been taking Plavix for a couple of months.  - Encourage home BP monitoring - Start hydralazine 50mg  PO q6H - Clarify home blood pressure medications with patient regarding who stopped hydralazine and for what reason if any. - Continue home amlodipine 10mg  PO QD  Constipation Patient history and exam concerning for constipation. States she frequently takes imodium on dialysis days. Moderate stool burden on xray. - Miralax 17g PO QD - Follow-up with patient tomorrow regarding bowel movements  Insulin-Dependent Type 2 Diabetes Mellitus Fasting blood glucose 202 today. Home medications include liraglutide 0.6mg  QD and Lantus 15 units at bedtime. Diabetes coordinator recommends Semglee 8 units, which would be half of her home dose. - Continue sliding scale insulin - Start Semglee 8 units  HIV Stable on Biktarvy. - Continue Biktarvy   Best  Practices: Diet: Renal/Carb Modified with Fluid Restriction Fluids: None VTE Prophylaxis: None Antibiotics: Augmentin (day 4 of 7) Code Status: Full Code    LOS: 0 days   Kathy Frank, Medical Student 06/29/2022, 3:51 PM  Attestation for Student Documentation:  I  personally was present and performed or re-performed the history, physical exam and medical decision-making activities of this service and have verified that the service and findings are accurately documented in the student's note.  Kathy Frank is a 46 y/o person who presented with persistent epistaxis and hgb on presentation under 7.0. She has received a total of 2 U of PRBC since admission. Her epistaxis has resolved and hopeful her hemoglobin will normalize, nephrology has also increased her EPO dose. She will need to be restarted on anti-platelet therapy once hemoglobin stabilizes with Hx of CAD s/p PCI and CVA. Today she endorsed constipation that then led to softer bowel movements, KUB with evidence of moderate stool burden. Will start miralax and hopeful for resolution of this. If difficulties controlling migraines believe Nurtec would work well for her.   Kathy Pope, MD 06/29/2022, 6:41 PM

## 2022-06-29 NOTE — TOC Benefit Eligibility Note (Signed)
Patient Teacher, English as a foreign language completed.    The patient is currently admitted and upon discharge could be taking Nurtec 75 mg tablets.  Requires Prior Authorization  The patient is insured through [employer] Patient Advocate Specialist [place] Patient Advocate Team Direct Number: [phone]  Fax: [phone]

## 2022-06-30 DIAGNOSIS — G43109 Migraine with aura, not intractable, without status migrainosus: Secondary | ICD-10-CM

## 2022-06-30 DIAGNOSIS — D649 Anemia, unspecified: Secondary | ICD-10-CM | POA: Diagnosis not present

## 2022-06-30 LAB — CBC WITH DIFFERENTIAL/PLATELET
Abs Immature Granulocytes: 0.03 10*3/uL (ref 0.00–0.07)
Basophils Absolute: 0 10*3/uL (ref 0.0–0.1)
Basophils Relative: 1 %
Eosinophils Absolute: 0.2 10*3/uL (ref 0.0–0.5)
Eosinophils Relative: 3 %
HCT: 23.3 % — ABNORMAL LOW (ref 36.0–46.0)
Hemoglobin: 7.7 g/dL — ABNORMAL LOW (ref 12.0–15.0)
Immature Granulocytes: 1 %
Lymphocytes Relative: 15 %
Lymphs Abs: 1 10*3/uL (ref 0.7–4.0)
MCH: 30.3 pg (ref 26.0–34.0)
MCHC: 33 g/dL (ref 30.0–36.0)
MCV: 91.7 fL (ref 80.0–100.0)
Monocytes Absolute: 0.7 10*3/uL (ref 0.1–1.0)
Monocytes Relative: 11 %
Neutro Abs: 4.7 10*3/uL (ref 1.7–7.7)
Neutrophils Relative %: 69 %
Platelets: 405 10*3/uL — ABNORMAL HIGH (ref 150–400)
RBC: 2.54 MIL/uL — ABNORMAL LOW (ref 3.87–5.11)
RDW: 18.4 % — ABNORMAL HIGH (ref 11.5–15.5)
WBC: 6.7 10*3/uL (ref 4.0–10.5)
nRBC: 0 % (ref 0.0–0.2)

## 2022-06-30 LAB — GLUCOSE, CAPILLARY
Glucose-Capillary: 146 mg/dL — ABNORMAL HIGH (ref 70–99)
Glucose-Capillary: 202 mg/dL — ABNORMAL HIGH (ref 70–99)
Glucose-Capillary: 198 mg/dL — ABNORMAL HIGH (ref 70–99)

## 2022-06-30 LAB — BPAM RBC
Blood Product Expiration Date: 202311252359
Blood Product Expiration Date: 202312042359
ISSUE DATE / TIME: 202311182001
ISSUE DATE / TIME: 202311200650
Unit Type and Rh: 1700
Unit Type and Rh: 7300

## 2022-06-30 LAB — CULTURE, BLOOD (SINGLE)
Culture: NO GROWTH
Culture: NO GROWTH
Special Requests: ADEQUATE

## 2022-06-30 LAB — TYPE AND SCREEN
ABO/RH(D): B POS
Antibody Screen: NEGATIVE
Unit division: 0
Unit division: 0

## 2022-06-30 LAB — RENAL FUNCTION PANEL
Albumin: 2.4 g/dL — ABNORMAL LOW (ref 3.5–5.0)
Anion gap: 18 — ABNORMAL HIGH (ref 5–15)
BUN: 29 mg/dL — ABNORMAL HIGH (ref 6–20)
CO2: 27 mmol/L (ref 22–32)
Calcium: 8.5 mg/dL — ABNORMAL LOW (ref 8.9–10.3)
Chloride: 90 mmol/L — ABNORMAL LOW (ref 98–111)
Creatinine, Ser: 5.49 mg/dL — ABNORMAL HIGH (ref 0.44–1.00)
GFR, Estimated: 9 mL/min — ABNORMAL LOW (ref >60)
Glucose, Bld: 139 mg/dL — ABNORMAL HIGH (ref 70–99)
Phosphorus: 5.7 mg/dL — ABNORMAL HIGH (ref 2.5–4.6)
Potassium: 4.4 mmol/L (ref 3.5–5.1)
Sodium: 135 mmol/L (ref 135–145)

## 2022-06-30 LAB — HEMOGLOBIN A1C
Hgb A1c MFr Bld: 6.3 % — ABNORMAL HIGH (ref 4.8–5.6)
Mean Plasma Glucose: 134 mg/dL

## 2022-06-30 MED ORDER — CALCIUM ACETATE (PHOS BINDER) 667 MG PO CAPS: 1334.0000 mg | ORAL_CAPSULE | Freq: Three times a day (TID) | ORAL | 0 refills | Status: DC

## 2022-06-30 MED ORDER — DARBEPOETIN ALFA 200 MCG/0.4ML IJ SOSY: 200.0000 ug | PREFILLED_SYRINGE | INTRAMUSCULAR | Status: DC

## 2022-06-30 MED ORDER — DEXCOM G7 SENSOR MISC: 6 refills | Status: DC

## 2022-06-30 MED ORDER — LIRAGLUTIDE 18 MG/3ML ~~LOC~~ SOPN: 0.6000 mg | PEN_INJECTOR | Freq: Every day | SUBCUTANEOUS | 4 refills | Status: DC

## 2022-06-30 MED ORDER — POLYETHYLENE GLYCOL 3350 17 G PO PACK: 17.0000 g | PACK | Freq: Every day | ORAL | 0 refills | Status: DC

## 2022-06-30 MED ORDER — LANTUS SOLOSTAR 100 UNIT/ML ~~LOC~~ SOPN: 15.0000 [IU] | PEN_INJECTOR | Freq: Every day | SUBCUTANEOUS | 4 refills | Status: DC

## 2022-06-30 MED ORDER — DEXCOM G7 RECEIVER DEVI: 5 refills | Status: DC

## 2022-06-30 MED ORDER — CALCITRIOL 0.25 MCG PO CAPS: 1.2500 ug | ORAL_CAPSULE | ORAL | 0 refills | Status: DC

## 2022-06-30 NOTE — Discharge Summary (Signed)
Name: Kathy Frank MRN: [id number] DOB: 1976-02-24 46 y.o. PCP: [name], MD  Date of Admission: 06/27/2022  7:45 AM Date of Discharge:  06/30/2022 Attending Physician: Dr. Jimmye [place]  DISCHARGE DIAGNOSIS:  Primary Problem: Epistaxis   Hospital Problems: Principal Problem:   Epistaxis Active Problems:   Diabetes mellitus type 2 in obese (HCC)   Normocytic anemia   Essential hypertension   ESRD (end stage renal disease) ([place])   Migraine with aura    DISCHARGE MEDICATIONS:   Allergies as of 06/30/2022   No Known Allergies      Medication List     STOP taking these medications    clopidogrel 75 MG tablet Commonly known as: PLAVIX   hydrALAZINE 50 MG tablet Commonly known as: APRESOLINE   loperamide 2 MG tablet Commonly known as: IMODIUM A-D   MIRCERA IJ       TAKE these medications    Accu-Chek Softclix Lancets lancets Use as instructed   amLODipine 10 MG tablet Commonly known as: NORVASC Take 1 tablet (10 mg total) by mouth daily.   amoxicillin-clavulanate 500-125 MG tablet Commonly known as: Augmentin Take 1 tablet by mouth at bedtime.   atorvastatin 80 MG tablet Commonly known as: LIPITOR Take 1 tablet (80 mg total) by mouth daily.   Biktarvy 50-200-25 MG Tabs tablet Generic drug: bictegravir-emtricitabine-tenofovir AF Take 1 tablet by mouth daily. OVERDUE FOR OFFICE FOLLOW UP - PLEASE CALL OUR OFFICE TO SCHEDULE [phone] What changed: additional instructions   calcitRIOL 0.25 MCG capsule Commonly known as: ROCALTROL Take 5 capsules (1.25 mcg total) by mouth Every Tuesday,Thursday,and Saturday with dialysis. Start taking on: July 02, 2022 What changed: how much to take   calcium acetate 667 MG capsule Commonly known as: PHOSLO Take 2 capsules (1,334 mg total) by mouth 3 (three) times daily with meals. What changed: how much to take   cyclobenzaprine 5 MG tablet Commonly known as: FLEXERIL Take 5 mg by mouth 2 (two)  times daily as needed for muscle spasms.   Darbepoetin Alfa 200 MCG/0.4ML Sosy injection Commonly known as: ARANESP Inject 0.4 mLs (200 mcg total) into the skin every Monday at 6 PM. Start taking on: July 06, 2022   Dexcom G7 Receiver Kerrin Mo Use to check blood sugars cntinuously   Dexcom G7 Sensor Misc Use to check blood sugar continuously   EQ Aspirin Adult Low Dose 81 MG tablet Generic drug: aspirin EC TAKE 1 TABLET BY MOUTH ONCE DAILY. SWALLOW WHOLE.   glucose blood test strip Use as instructed   Lantus SoloStar 100 UNIT/ML Solostar Pen Generic drug: insulin glargine Inject 15 Units into the skin at bedtime.   liraglutide 18 MG/3ML Sopn Commonly known as: VICTOZA Inject 0.6 mg into the skin daily.   multivitamin Tabs tablet Take 1 tablet by mouth daily. What changed:  when to take this additional instructions   Pentips 32G X 4 MM Misc Generic drug: Insulin Pen Needle Use as directed   polyethylene glycol 17 g packet Commonly known as: MIRALAX / GLYCOLAX Take 17 g by mouth daily. Start taking on: July 01, 2022   ProAir HFA 108 (90 Base) MCG/ACT inhaler Generic drug: albuterol Inhale 2 puffs into the lungs every 4 (four) hours as needed for wheezing or shortness of breath.        DISPOSITION AND FOLLOW-UP:  KathyTrixy C Frank was discharged from [facility] in Stable condition. At the hospital follow up visit please address:  Follow-up Recommendations: Consults: Neurology, ENT  Labs: CBC Studies: None Medications:  Low-dose aspirin 81mg  daily Miralax daily until bowel movements become regular Continue new doses of ESA (injection provided Mondays at dialysis), calcitriol (5 capsules of 0.25mg  on dialysis days), and phoslo (2 capsules of 667mg  three times daily with meals) as prescribed by nephrology in hospital Finish course of Augmentin (3 doses left). Take one dose tonight followed by one dose on both Wednesday and  Thursday. Discontinue BC powders Discontinue imodium Discontinue Plavix as patient was no longer taking Continue other home medications as prescribed Other: Home BP and glucose monitoring  Persistent Epistaxis, resolved Normocytic Anemia Discontinue BC powder use to prevent recurrent bleeding Continue taking ESA as prescribed by nephrology   Migraine with Aura Discontinue BC powder use to prevent headaches due to medication overuse Prochlorperazine contraindicated due to prolonged QT. Triptans contraindicated due to history of CVA.  Recommend follow-up outpatient to pursue preventative treatment options and to file prior authorization paperwork for Nurtec. Optimize BP control in an attempt to improve symptoms   Pneumonia Continue Augmentin 500-125mg  QD at bedtime. Today will be day 5 of 7. Needs to take for two more days after tonight's dose. Recommend using home albuterol inhaler if short of breath   End Stage Renal Disease on Hemodialysis Secondary Hyperparathyroidism Plan for next hemodialysis appointment is on Wednesday due to the holiday schedule.  Continue home ESA, calcitriol, and Phoslo as prescribed by nephrology   Hypertension Hx Cerebrovascular Accident Coronary Artery Disease s/p Stent Peripheral Artery Disease Encouraged home BP monitoring and patient states she would be able to purchase a cuff with her OTC card. Continue losartan and amlodipine as prescribed by nephrology As patient is past the one year mark for anticoagulation therapy following her stent in October 2022, would recommend continuing low dose aspirin 81mg   Constipation Recommend taking Miralax daily until having regular bowel movements. Recommend discontinuation of Imodium until after bowel movements have become regular. Can explore in outpatient setting if patient experiences further incontinence after resolution of current constipation.   Insulin-Dependent Type 2 Diabetes Mellitus Recommend blood  glucose monitoring at home and follow-up in outpatient setting to determine if current treatment should be adjusted. Continue home dose Lantus and liraglutide until follow-up.   Follow-up Appointments:  Follow-up Information     Schedule an appointment as soon as possible for a visit  with Jenetta Downer, MD.   Specialty: Otolaryngology Contact information: [address], [address] [place] [address] [phone]                 HOSPITAL COURSE:  Patient Summary: Kathy Frank is a 46 year old female with a history of ESRD on HD, CAD s/p stent in Oct. 2022, CVA, HIV, HTN, and T2DM who presented to the emergency department with a persistent nosebleed lasting over 24 hours that was caused by digital trauma. Due the nosebleed, she had missed her regularly scheduled dialysis that day. She was given afrin and a rhino rocket was placed with subsequent resolution of bleeding by the following day. Hemoglobin was noted to be low and required two units of PRBCs of the course of two days to remain above 7 and stable. Patient is chronically anemic due to ESRD. Nephrology was consulted and she received inpatient hemodialysis on 11/19 and 11/20. They also increased her dose of ESA to reduce anemia. During her stay, she also complained of migraine with aura and informed our team that she had been taking 2-3 BC powders every day since September to help her  continual headache. This likely contributed to her significant nosebleed. Due to prior CVA, patient is ineligible for triptan use. Promethazine and equivalents are also contraindicated due to her prolonged QT interval. Medication overuse also likely contributes to her continual headache. She was given benadryl with some improvement. She was instructed to stop using BC powders and to follow up in the clinic to pursue prior authorization for Nurtec. The patient also felt as though her high blood pressures were contributing to her headaches. Blood pressure  remained elevated in the inpatient setting. She was advised to continue to her home losartan and amlodipine as prescribed by nephrology and to keep a log of her blood pressures at home so she can discuss treatment options with them at her next appointment. Throughout her stay, she also received a continuation of her previously prescribed Augmentin for a pneumonia that was diagnosed in the outpatient setting prior to her arrival. She has not received a dose for 11/21 and will need to take today's dose later this evening. She will also receive two more daily doses to finish her 7-day course of antibiotics. Patient's history and exam was concerning for constipation. She states she frequently takes imodium on dialysis days to prevent incontinence, but that she had been constipated prior to her admission. Moderate stool burden was visualized on xray. Miralax was provided and patient had one bowel movement this morning. She was instructed to continue taking miralax and to stop taking imodium during dialysis until constipation resolves.      DISCHARGE INSTRUCTIONS:    SUBJECTIVE:  Ms. Purtle was resting comfortably in bed on our arrival. Her mood was improved compared to yesterday. She stated she had some difficulty breathing overnight and had to use her inhaler, but is otherwise doing well. Her headache has improved and she did not appear to be as sensitive to light as yesterday. She endorses a bowel movement this morning that was dark, which would be expected with her recent nosebleed. She denies any further bleeding and states she is eager to go home for Thanksgiving.   Discharge Vitals:   BP (!) 164/76 (BP Location: Right Arm, Patient Position: Sitting)   Pulse 91   Temp 98.3 F (36.8 C) (Oral)   Resp 17   Wt 75.6 kg   LMP 03/24/2022   SpO2 100%   BMI 27.73 kg/m   OBJECTIVE:  Physical Exam Constitutional:      General: She is not in acute distress.    Appearance: She is not ill-appearing.   HENT:     Nose: Congestion present.  Pulmonary:     Effort: Pulmonary effort is normal.     Breath sounds: Wheezing (mild anterior) present.  Abdominal:     General: There is distension (mild).     Palpations: Abdomen is soft. There is mass (palpable colon along left qudrants).     Tenderness: There is no abdominal tenderness. There is no guarding.  Musculoskeletal:     Comments: S/p left AKA and right BKA  Skin:    General: Skin is warm and dry.  Neurological:     Mental Status: She is alert and oriented to person, place, and time.  Psychiatric:        Mood and Affect: Mood normal.      Pertinent Labs, Studies, and Procedures:     Latest Ref Rng & Units 06/30/2022    2:31 AM 06/29/2022    1:34 PM 06/29/2022    3:25 AM  CBC  WBC 4.0 -  10.5 K/uL 6.7  6.0  5.3   Hemoglobin 12.0 - 15.0 g/dL 7.7  7.2  6.7   Hematocrit 36.0 - 46.0 % 23.3  21.9  20.2   Platelets 150 - 400 K/uL 405  381  358        Latest Ref Rng & Units 06/30/2022    2:31 AM 06/29/2022    3:25 AM 06/28/2022    2:24 AM  CMP  Glucose 70 - 99 mg/dL 139  216  94   BUN 6 - 20 mg/dL 29  26  81   Creatinine 0.44 - 1.00 mg/dL 5.49  5.00  9.83   Sodium 135 - 145 mmol/L 135  135  138   Potassium 3.5 - 5.1 mmol/L 4.4  4.2  4.4   Chloride 98 - 111 mmol/L 90  93  92   CO2 22 - 32 mmol/L 27  27  22    Calcium 8.9 - 10.3 mg/dL 8.5  8.2  7.6     No results found.   Signed: Jackelyn Poling Medical Student, MS-4 [facility]  Pager: [phone]

## 2022-06-30 NOTE — Progress Notes (Signed)
[facility] Progress Note   Subjective:   Seen in room - no more nosebleeds. Tells me she is going home today. No CP/dyspnea.  Objective Vitals:   06/29/22 2111 06/30/22 0639 06/30/22 0830 06/30/22 1216  BP: (!) 152/74 (!) 179/79 (!) 133/95 (!) 164/76  Pulse: 89 85 88 91  Resp: 18 16 17    Temp: 99 F (37.2 C) 98.3 F (36.8 C)    TempSrc: Oral Oral    SpO2: 100% 100% 100%   Weight:       Physical Exam General: Well appearing woman, NAD. Room air. Heart: RRR; no murmur Lungs: CTAB; no rales Abdomen: soft Extremities: B BKA without edema Dialysis Access: L AVF + bruit  Additional Objective Labs: Basic Metabolic Panel: Recent Labs  Lab 06/27/22 1435 06/28/22 0224 06/29/22 0325 06/30/22 0231  NA 137 138 135 135  K 5.0 4.4 4.2 4.4  CL 89* 92* 93* 90*  CO2 21* 22 27 27   GLUCOSE 141* 94 216* 139*  BUN 71* 81* 26* 29*  CREATININE 8.96* 9.83* 5.00* 5.49*  CALCIUM 7.7* 7.6* 8.2* 8.5*  PHOS 8.0*  --   --  5.7*   Liver Function Tests: Recent Labs  Lab 06/27/22 1435 06/30/22 0231  ALBUMIN 2.7* 2.4*   CBC: Recent Labs  Lab 06/27/22 0914 06/27/22 1300 06/28/22 0224 06/29/22 0325 06/29/22 1334 06/30/22 0231  WBC 7.8 8.5 7.8 5.3 6.0 6.7  NEUTROABS 5.6  --   --   --  4.1 4.7  HGB 7.0* 6.7* 7.2* 6.7* 7.2* 7.7*  HCT 21.0* 20.5* 21.4* 20.2* 21.9* 23.3*  MCV 98.1 95.3 93.0 94.0 92.4 91.7  PLT 323 353 327 358 381 405*   Blood Culture    Component Value Date/Time   SDES  06/25/2022 1638    BLOOD RIGHT HAND BOTTLES DRAWN AEROBIC AND ANAEROBIC Blood Culture results may not be optimal due to an excessive volume of blood received in culture bottles   SPECREQUEST NONE 06/25/2022 1638   CULT  06/25/2022 1638    NO GROWTH 5 DAYS Performed at [facility], [place] [address], [place], [place] [address]    REPTSTATUS 06/30/2022 FINAL 06/25/2022 1638   Medications:  sodium chloride      amLODipine  10 mg Oral Daily   amoxicillin-clavulanate  1  tablet Oral QHS   bictegravir-emtricitabine-tenofovir AF  1 tablet Oral Daily   calcitRIOL  1.25 mcg Oral Q T,Th,Sa-HD   calcium acetate  1,334 mg Oral TID WC   Chlorhexidine Gluconate Cloth  6 each Topical Q0600   Chlorhexidine Gluconate Cloth  6 each Topical Q0600   darbepoetin (ARANESP) injection - NON-DIALYSIS  200 mcg Subcutaneous Q Mon-1800   hydrALAZINE  50 mg Oral QID   insulin aspart  0-5 Units Subcutaneous QHS   insulin aspart  0-9 Units Subcutaneous TID WC   insulin glargine-yfgn  8 Units Subcutaneous QHS   melatonin  3 mg Oral QHS   polyethylene glycol  17 g Oral Daily    Dialysis Orders: TTS [facility]  4h   400/1.5   74.5kg  2K/3ca bath  Hep none  LUA AVF - last HD 11/16, post wt 74.8kg - venofer 50 weekly - calcitriol 1.25 ug po tiw - mircera 75 ug IV q4, last 11/4   Assessment/Plan: Epistaxis - recurrent issue, here in early November w/ same thing. S/p nasal packing. Per primary team. ESRD: Usual TTS schedule. Dialyzed 11/19 as make-up, then 11/20 per Thanksgiving Holiday Schedule (Mon/Wed/Sat) -  next HD tomorrow, likely as outpatient. HTN/ volume: BP better, closer to EDW. Anemia of ESRD + ABLA: Hgb nadir 6.7, 2U PRBCs given this admit. Aranesp 230mcg given 11/20. Secondary HPTH: Ca ok, Phos ^. Continue VDRA, restarted home binders (Phoslo) 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 HIV - on Biktarvy IDDM - per pmd  Veneta Penton, PA-C 06/30/2022, 12:27 PM  [facility]

## 2022-06-30 NOTE — Progress Notes (Signed)
Pt refused all vitals. RN notified will continue to monitor

## 2022-06-30 NOTE — Progress Notes (Signed)
Pt to d/c to home today. Contacted [facility] and spoke to [place], Therapist, sports. Clinic aware of pt's d/c and that pt will resume care tomorrow on holiday schedule.   Melven Sartorius Renal Navigator [phone]

## 2022-06-30 NOTE — Progress Notes (Signed)
Kathy Frank to be D/C'd Home per MD order.  Discussed with the patient and all questions fully answered.  VSS, Skin clean, dry and intact without evidence of skin break down, no evidence of skin tears noted. IV catheter discontinued intact. Site without signs and symptoms of complications. Dressing and pressure applied.  An After Visit Summary was printed and given to the patient. Patient prescriptions sent to pharmacy.  D/c education completed with patient/family including follow up instructions, medication list, d/c activities limitations if indicated, with other d/c instructions as indicated by MD - patient able to verbalize understanding, all questions fully answered.   Patient instructed to return to ED, call 911, or call MD for any changes in condition.   Patient escorted via [place], and D/C home via private auto.  Kathy Frank 06/30/2022 5:03 PM

## 2022-07-01 ENCOUNTER — Telehealth (HOSPITAL_COMMUNITY): Payer: Self-pay | Admitting: Nephrology

## 2022-07-01 ENCOUNTER — Other Ambulatory Visit: Payer: Self-pay | Admitting: Internal Medicine

## 2022-07-01 ENCOUNTER — Other Ambulatory Visit (HOSPITAL_COMMUNITY): Payer: Self-pay

## 2022-07-01 ENCOUNTER — Telehealth: Payer: Self-pay

## 2022-07-01 DIAGNOSIS — Z992 Dependence on renal dialysis: Secondary | ICD-10-CM | POA: Diagnosis not present

## 2022-07-01 DIAGNOSIS — N186 End stage renal disease: Secondary | ICD-10-CM | POA: Diagnosis not present

## 2022-07-01 DIAGNOSIS — R519 Headache, unspecified: Secondary | ICD-10-CM | POA: Diagnosis not present

## 2022-07-01 DIAGNOSIS — I120 Hypertensive chronic kidney disease with stage 5 chronic kidney disease or end stage renal disease: Secondary | ICD-10-CM | POA: Diagnosis not present

## 2022-07-01 DIAGNOSIS — D631 Anemia in chronic kidney disease: Secondary | ICD-10-CM | POA: Diagnosis not present

## 2022-07-01 DIAGNOSIS — N2581 Secondary hyperparathyroidism of renal origin: Secondary | ICD-10-CM | POA: Diagnosis not present

## 2022-07-01 DIAGNOSIS — B20 Human immunodeficiency virus [HIV] disease: Secondary | ICD-10-CM

## 2022-07-01 DIAGNOSIS — L299 Pruritus, unspecified: Secondary | ICD-10-CM | POA: Diagnosis not present

## 2022-07-01 NOTE — Telephone Encounter (Signed)
Prior Authorization for patient (Calcitriol) came through on cover my med was submitted with last office notes awaiting approval or denial

## 2022-07-01 NOTE — Telephone Encounter (Signed)
Transition of care contact from inpatient facility  Date of discharge: 06/30/22 Date of contact: 07/01/22 Method: Phone Spoke to: Patient  Patient contacted to discuss transition of care from recent inpatient hospitalization. Patient was admitted to [facility] from 11/18-11/21/23 with discharge diagnosis of epistaxis, ESRD, pneumonia.  Medication changes were reviewed. She has everything.  Went to dialysis today - did fine. No further nosebleeds.  No add'l needs today.  Veneta Penton, PA-C [employer] Pager [phone]

## 2022-07-03 ENCOUNTER — Other Ambulatory Visit (HOSPITAL_COMMUNITY): Payer: Self-pay

## 2022-07-04 DIAGNOSIS — N186 End stage renal disease: Secondary | ICD-10-CM | POA: Diagnosis not present

## 2022-07-04 DIAGNOSIS — R519 Headache, unspecified: Secondary | ICD-10-CM | POA: Diagnosis not present

## 2022-07-04 DIAGNOSIS — Z992 Dependence on renal dialysis: Secondary | ICD-10-CM | POA: Diagnosis not present

## 2022-07-04 DIAGNOSIS — N2581 Secondary hyperparathyroidism of renal origin: Secondary | ICD-10-CM | POA: Diagnosis not present

## 2022-07-04 DIAGNOSIS — I120 Hypertensive chronic kidney disease with stage 5 chronic kidney disease or end stage renal disease: Secondary | ICD-10-CM | POA: Diagnosis not present

## 2022-07-04 DIAGNOSIS — D631 Anemia in chronic kidney disease: Secondary | ICD-10-CM | POA: Diagnosis not present

## 2022-07-04 DIAGNOSIS — L299 Pruritus, unspecified: Secondary | ICD-10-CM | POA: Diagnosis not present

## 2022-07-06 ENCOUNTER — Other Ambulatory Visit (HOSPITAL_COMMUNITY): Payer: Self-pay

## 2022-07-06 ENCOUNTER — Telehealth: Payer: Self-pay | Admitting: *Deleted

## 2022-07-06 ENCOUNTER — Encounter: Payer: Medicare Other | Admitting: Student

## 2022-07-06 NOTE — Telephone Encounter (Signed)
Patient called in stating she is supposed to start Aranesp injections for anemia but she does not know where to go to receive them. Please advise.

## 2022-07-06 NOTE — Telephone Encounter (Signed)
Decision:Denied CALCITRIOL CAP 0.25MCG is a benefit exclusion under Medicare Part D coverage. Endstage renal disease (ESRD) medications will not be approved for separate payment under Part B or Part D when their use is related or unrelated to ESRD and dialysis (for example: antiinfectives used to treat access site infection). Renal dialysis services must be billed by the renal dialysis facility. Reviewed by: Alonna Buckler.Ph. If the treating physician would like to discuss this coverage decision with the physician or health care professional reviewer, please call [employer] Prior Authorization department at [phone].

## 2022-07-07 ENCOUNTER — Telehealth: Payer: Self-pay

## 2022-07-07 DIAGNOSIS — L299 Pruritus, unspecified: Secondary | ICD-10-CM | POA: Diagnosis not present

## 2022-07-07 DIAGNOSIS — N2581 Secondary hyperparathyroidism of renal origin: Secondary | ICD-10-CM | POA: Diagnosis not present

## 2022-07-07 DIAGNOSIS — Z992 Dependence on renal dialysis: Secondary | ICD-10-CM | POA: Diagnosis not present

## 2022-07-07 DIAGNOSIS — D631 Anemia in chronic kidney disease: Secondary | ICD-10-CM | POA: Diagnosis not present

## 2022-07-07 DIAGNOSIS — R519 Headache, unspecified: Secondary | ICD-10-CM | POA: Diagnosis not present

## 2022-07-07 DIAGNOSIS — I120 Hypertensive chronic kidney disease with stage 5 chronic kidney disease or end stage renal disease: Secondary | ICD-10-CM | POA: Diagnosis not present

## 2022-07-07 DIAGNOSIS — N186 End stage renal disease: Secondary | ICD-10-CM | POA: Diagnosis not present

## 2022-07-07 NOTE — Telephone Encounter (Signed)
Patient calling back. Please advise

## 2022-07-07 NOTE — Telephone Encounter (Signed)
Patient called regarding the conversation between you 2 yesterday patient wants to know "what she is supposed to be doing" please return patients call

## 2022-07-07 NOTE — Telephone Encounter (Signed)
Hello Dr.Carter patient called regarding a refill for Aranesp please attach pharmacy to rx.

## 2022-07-08 NOTE — Telephone Encounter (Signed)
This is being addressed in original phone note.

## 2022-07-08 NOTE — Telephone Encounter (Signed)
Attempted to relay info below to patient. Line goes immediately to recording stating VMB has not been set up.

## 2022-07-09 DIAGNOSIS — R040 Epistaxis: Secondary | ICD-10-CM | POA: Diagnosis not present

## 2022-07-09 DIAGNOSIS — Z992 Dependence on renal dialysis: Secondary | ICD-10-CM | POA: Diagnosis not present

## 2022-07-09 DIAGNOSIS — I120 Hypertensive chronic kidney disease with stage 5 chronic kidney disease or end stage renal disease: Secondary | ICD-10-CM | POA: Diagnosis not present

## 2022-07-09 DIAGNOSIS — N186 End stage renal disease: Secondary | ICD-10-CM | POA: Diagnosis not present

## 2022-07-09 DIAGNOSIS — D631 Anemia in chronic kidney disease: Secondary | ICD-10-CM | POA: Diagnosis not present

## 2022-07-09 DIAGNOSIS — R519 Headache, unspecified: Secondary | ICD-10-CM | POA: Diagnosis not present

## 2022-07-09 DIAGNOSIS — L299 Pruritus, unspecified: Secondary | ICD-10-CM | POA: Diagnosis not present

## 2022-07-09 DIAGNOSIS — E1129 Type 2 diabetes mellitus with other diabetic kidney complication: Secondary | ICD-10-CM | POA: Diagnosis not present

## 2022-07-09 DIAGNOSIS — N2581 Secondary hyperparathyroidism of renal origin: Secondary | ICD-10-CM | POA: Diagnosis not present

## 2022-07-09 NOTE — Telephone Encounter (Signed)
Please see phone note from 11/27. Again attempted to relay info from Dr. Lisabeth Devoid this AM. No answer, and no VM set up.

## 2022-07-11 DIAGNOSIS — R197 Diarrhea, unspecified: Secondary | ICD-10-CM | POA: Diagnosis not present

## 2022-07-11 DIAGNOSIS — R519 Headache, unspecified: Secondary | ICD-10-CM | POA: Diagnosis not present

## 2022-07-11 DIAGNOSIS — D631 Anemia in chronic kidney disease: Secondary | ICD-10-CM | POA: Diagnosis not present

## 2022-07-11 DIAGNOSIS — N2581 Secondary hyperparathyroidism of renal origin: Secondary | ICD-10-CM | POA: Diagnosis not present

## 2022-07-11 DIAGNOSIS — Z992 Dependence on renal dialysis: Secondary | ICD-10-CM | POA: Diagnosis not present

## 2022-07-11 DIAGNOSIS — L299 Pruritus, unspecified: Secondary | ICD-10-CM | POA: Diagnosis not present

## 2022-07-11 DIAGNOSIS — N186 End stage renal disease: Secondary | ICD-10-CM | POA: Diagnosis not present

## 2022-07-11 DIAGNOSIS — E875 Hyperkalemia: Secondary | ICD-10-CM | POA: Diagnosis not present

## 2022-07-13 ENCOUNTER — Encounter: Payer: Medicare Other | Admitting: Student

## 2022-07-13 ENCOUNTER — Telehealth: Payer: Self-pay | Admitting: Student

## 2022-07-13 NOTE — Patient Instructions (Incomplete)
     ESRD on HD, CAD s/p stent in Oct. 2022, CVA, HIV, HTN, and T2DM  Epistaxis ENT referral  Anemia of chronic disease Normocytic anemia C/b epistaxis requiring 2 units of pRBCs Patient had been using BC poweders chronically Continue home ESA as prescribed by nephrology; Inject 0.4 mLs (200 mcg total) into the skin every Monday at 6 PM.   Migraines She had been taking 2-3 BC powders every day since September to help her continual headache. This likely contributed to her significant nosebleed. Due to prior CVA, patient is ineligible for triptan use. Promethazine and equivalents are also contraindicated due to her prolonged QT interval. not enough information to schedule a neuro eval. Recommending eval and treatment through PCP first esp in light of recent hypertensive crisis. Calling referring office to advise of same  -consider prior authorization for Nurtec.  -Neurology follow up?     DISCHARGE INSTRUCTIONS:      SUBJECTIVE:  Kathy Frank was resting comfortably in bed on our arrival. Her mood was improved compared to yesterday. She stated she had some difficulty breathing overnight and had to use her inhaler, but is otherwise doing well. Her headache has improved and she did not appear to be as sensitive to light as yesterday. She endorses a bowel movement this morning that was dark, which would be expected with her recent nosebleed. She denies any further bleeding and states she is eager to go home for Thanksgiving.Follow-up Recommendations: Consults: Neurology, ENT Labs: CBC Studies: None Medications:  Low-dose aspirin 81mg  daily Miralax daily until bowel movements become regular Continue new doses of ESA (injection provided Mondays at dialysis), calcitriol (5 capsules of 0.25mg  on dialysis days), and phoslo (2 capsules of 667mg  three times daily with meals) as prescribed by nephrology in hospital Finish course of Augmentin (3 doses left). Take one dose tonight followed by one dose  on both Wednesday and Thursday. Discontinue BC powders Discontinue imodium       Migraine with Aura Discontinue BC powder use to prevent headaches due to medication overuse Prochlorperazine contraindicated due to prolonged QT. Triptans contraindicated due to history of CVA.  Recommend follow-up outpatient to pursue preventative treatment options and to file prior authorization paperwork for Nurtec. Optimize BP control in an attempt to improve symptoms   Pneumonia Pateint had been diagnosed with PNA prior to her recent hospitalization. Upon discharge, she was to continue Augmentin 500-125mg  QD at bedtime   End Stage Renal Disease on Hemodialysis Secondary Hyperparathyroidism Hemodialysis schedule: -Continue calcitriol, and Phoslo as prescribed by nephrology   Hypertension Losartan and amlodipine  PAD CAD s/p stent Past the one year mark for anticoagulation therapy following her stent in October 2022. Plavix discontinued during past hospita -Continue low dose aspirin 81mg   CVA     Insulin-Dependent Type 2 Diabetes Mellitus A1c 6.3 15 Continue home dose Lantus 15 and liraglutide 0.6mg   Constipation Miralax

## 2022-07-13 NOTE — Telephone Encounter (Signed)
Call to patient got the name of Dialysis Center she goes to.  Patient goes t [facility].  Call to [facility] spoke to person there who had Nurse Dorcas Carrow call return call to Clinics.  Aranesp is no longer used at the Center per [facility] Camera operator.. Patient's are now given Micera every other week. Patient received Micera 225 mcg on 07/07/2022 and is scheduled to receive  Micera 225mg  on 07/16/2022.

## 2022-07-13 NOTE — Telephone Encounter (Signed)
Pt requesting a call back about her the following shots she was to have per the conversation below. She states she called their office and they know nothing about getting these types of shots.  Please call the patient back as she has some concerns.   Kathy Beard, MD  to [name], RN  Lucious Groves, DO     07/07/22  1:03 PM  She is an ESRD patient. Usually they get aranesp through nephrology during dialysis. She should contact her nephrologist regarding this and hopefully she can get it during her HD sessions.

## 2022-07-14 DIAGNOSIS — Z992 Dependence on renal dialysis: Secondary | ICD-10-CM | POA: Diagnosis not present

## 2022-07-14 DIAGNOSIS — N186 End stage renal disease: Secondary | ICD-10-CM | POA: Diagnosis not present

## 2022-07-14 DIAGNOSIS — R519 Headache, unspecified: Secondary | ICD-10-CM | POA: Diagnosis not present

## 2022-07-14 DIAGNOSIS — R197 Diarrhea, unspecified: Secondary | ICD-10-CM | POA: Diagnosis not present

## 2022-07-14 DIAGNOSIS — D631 Anemia in chronic kidney disease: Secondary | ICD-10-CM | POA: Diagnosis not present

## 2022-07-14 DIAGNOSIS — E875 Hyperkalemia: Secondary | ICD-10-CM | POA: Diagnosis not present

## 2022-07-14 DIAGNOSIS — L299 Pruritus, unspecified: Secondary | ICD-10-CM | POA: Diagnosis not present

## 2022-07-14 DIAGNOSIS — N2581 Secondary hyperparathyroidism of renal origin: Secondary | ICD-10-CM | POA: Diagnosis not present

## 2022-07-16 DIAGNOSIS — E875 Hyperkalemia: Secondary | ICD-10-CM | POA: Diagnosis not present

## 2022-07-16 DIAGNOSIS — R197 Diarrhea, unspecified: Secondary | ICD-10-CM | POA: Diagnosis not present

## 2022-07-16 DIAGNOSIS — R519 Headache, unspecified: Secondary | ICD-10-CM | POA: Diagnosis not present

## 2022-07-16 DIAGNOSIS — N186 End stage renal disease: Secondary | ICD-10-CM | POA: Diagnosis not present

## 2022-07-16 DIAGNOSIS — L299 Pruritus, unspecified: Secondary | ICD-10-CM | POA: Diagnosis not present

## 2022-07-16 DIAGNOSIS — N2581 Secondary hyperparathyroidism of renal origin: Secondary | ICD-10-CM | POA: Diagnosis not present

## 2022-07-16 DIAGNOSIS — Z992 Dependence on renal dialysis: Secondary | ICD-10-CM | POA: Diagnosis not present

## 2022-07-16 DIAGNOSIS — D631 Anemia in chronic kidney disease: Secondary | ICD-10-CM | POA: Diagnosis not present

## 2022-07-18 DIAGNOSIS — R519 Headache, unspecified: Secondary | ICD-10-CM | POA: Diagnosis not present

## 2022-07-18 DIAGNOSIS — N186 End stage renal disease: Secondary | ICD-10-CM | POA: Diagnosis not present

## 2022-07-18 DIAGNOSIS — N2581 Secondary hyperparathyroidism of renal origin: Secondary | ICD-10-CM | POA: Diagnosis not present

## 2022-07-18 DIAGNOSIS — D631 Anemia in chronic kidney disease: Secondary | ICD-10-CM | POA: Diagnosis not present

## 2022-07-18 DIAGNOSIS — R197 Diarrhea, unspecified: Secondary | ICD-10-CM | POA: Diagnosis not present

## 2022-07-18 DIAGNOSIS — L299 Pruritus, unspecified: Secondary | ICD-10-CM | POA: Diagnosis not present

## 2022-07-18 DIAGNOSIS — Z992 Dependence on renal dialysis: Secondary | ICD-10-CM | POA: Diagnosis not present

## 2022-07-18 DIAGNOSIS — E875 Hyperkalemia: Secondary | ICD-10-CM | POA: Diagnosis not present

## 2022-07-21 DIAGNOSIS — R519 Headache, unspecified: Secondary | ICD-10-CM | POA: Diagnosis not present

## 2022-07-21 DIAGNOSIS — N186 End stage renal disease: Secondary | ICD-10-CM | POA: Diagnosis not present

## 2022-07-21 DIAGNOSIS — R197 Diarrhea, unspecified: Secondary | ICD-10-CM | POA: Diagnosis not present

## 2022-07-21 DIAGNOSIS — E875 Hyperkalemia: Secondary | ICD-10-CM | POA: Diagnosis not present

## 2022-07-21 DIAGNOSIS — N2581 Secondary hyperparathyroidism of renal origin: Secondary | ICD-10-CM | POA: Diagnosis not present

## 2022-07-21 DIAGNOSIS — L299 Pruritus, unspecified: Secondary | ICD-10-CM | POA: Diagnosis not present

## 2022-07-21 DIAGNOSIS — D631 Anemia in chronic kidney disease: Secondary | ICD-10-CM | POA: Diagnosis not present

## 2022-07-21 DIAGNOSIS — Z992 Dependence on renal dialysis: Secondary | ICD-10-CM | POA: Diagnosis not present

## 2022-07-23 DIAGNOSIS — R197 Diarrhea, unspecified: Secondary | ICD-10-CM | POA: Diagnosis not present

## 2022-07-23 DIAGNOSIS — N2581 Secondary hyperparathyroidism of renal origin: Secondary | ICD-10-CM | POA: Diagnosis not present

## 2022-07-23 DIAGNOSIS — N186 End stage renal disease: Secondary | ICD-10-CM | POA: Diagnosis not present

## 2022-07-23 DIAGNOSIS — R519 Headache, unspecified: Secondary | ICD-10-CM | POA: Diagnosis not present

## 2022-07-23 DIAGNOSIS — L299 Pruritus, unspecified: Secondary | ICD-10-CM | POA: Diagnosis not present

## 2022-07-23 DIAGNOSIS — D631 Anemia in chronic kidney disease: Secondary | ICD-10-CM | POA: Diagnosis not present

## 2022-07-23 DIAGNOSIS — Z992 Dependence on renal dialysis: Secondary | ICD-10-CM | POA: Diagnosis not present

## 2022-07-23 DIAGNOSIS — E875 Hyperkalemia: Secondary | ICD-10-CM | POA: Diagnosis not present

## 2022-07-24 ENCOUNTER — Encounter: Payer: Medicare Other | Admitting: Student

## 2022-07-25 DIAGNOSIS — E875 Hyperkalemia: Secondary | ICD-10-CM | POA: Diagnosis not present

## 2022-07-25 DIAGNOSIS — N186 End stage renal disease: Secondary | ICD-10-CM | POA: Diagnosis not present

## 2022-07-25 DIAGNOSIS — Z992 Dependence on renal dialysis: Secondary | ICD-10-CM | POA: Diagnosis not present

## 2022-07-25 DIAGNOSIS — D631 Anemia in chronic kidney disease: Secondary | ICD-10-CM | POA: Diagnosis not present

## 2022-07-25 DIAGNOSIS — R197 Diarrhea, unspecified: Secondary | ICD-10-CM | POA: Diagnosis not present

## 2022-07-25 DIAGNOSIS — N2581 Secondary hyperparathyroidism of renal origin: Secondary | ICD-10-CM | POA: Diagnosis not present

## 2022-07-25 DIAGNOSIS — L299 Pruritus, unspecified: Secondary | ICD-10-CM | POA: Diagnosis not present

## 2022-07-25 DIAGNOSIS — R519 Headache, unspecified: Secondary | ICD-10-CM | POA: Diagnosis not present

## 2022-07-28 DIAGNOSIS — N186 End stage renal disease: Secondary | ICD-10-CM | POA: Diagnosis not present

## 2022-07-28 DIAGNOSIS — Z992 Dependence on renal dialysis: Secondary | ICD-10-CM | POA: Diagnosis not present

## 2022-07-28 DIAGNOSIS — E875 Hyperkalemia: Secondary | ICD-10-CM | POA: Diagnosis not present

## 2022-07-28 DIAGNOSIS — R197 Diarrhea, unspecified: Secondary | ICD-10-CM | POA: Diagnosis not present

## 2022-07-28 DIAGNOSIS — L299 Pruritus, unspecified: Secondary | ICD-10-CM | POA: Diagnosis not present

## 2022-07-28 DIAGNOSIS — R519 Headache, unspecified: Secondary | ICD-10-CM | POA: Diagnosis not present

## 2022-07-28 DIAGNOSIS — D631 Anemia in chronic kidney disease: Secondary | ICD-10-CM | POA: Diagnosis not present

## 2022-07-28 DIAGNOSIS — N2581 Secondary hyperparathyroidism of renal origin: Secondary | ICD-10-CM | POA: Diagnosis not present

## 2022-07-30 DIAGNOSIS — D631 Anemia in chronic kidney disease: Secondary | ICD-10-CM | POA: Diagnosis not present

## 2022-07-30 DIAGNOSIS — N2581 Secondary hyperparathyroidism of renal origin: Secondary | ICD-10-CM | POA: Diagnosis not present

## 2022-07-30 DIAGNOSIS — R519 Headache, unspecified: Secondary | ICD-10-CM | POA: Diagnosis not present

## 2022-07-30 DIAGNOSIS — N186 End stage renal disease: Secondary | ICD-10-CM | POA: Diagnosis not present

## 2022-07-30 DIAGNOSIS — Z992 Dependence on renal dialysis: Secondary | ICD-10-CM | POA: Diagnosis not present

## 2022-07-30 DIAGNOSIS — E875 Hyperkalemia: Secondary | ICD-10-CM | POA: Diagnosis not present

## 2022-07-30 DIAGNOSIS — R197 Diarrhea, unspecified: Secondary | ICD-10-CM | POA: Diagnosis not present

## 2022-07-30 DIAGNOSIS — L299 Pruritus, unspecified: Secondary | ICD-10-CM | POA: Diagnosis not present

## 2022-08-01 DIAGNOSIS — R197 Diarrhea, unspecified: Secondary | ICD-10-CM | POA: Diagnosis not present

## 2022-08-01 DIAGNOSIS — Z992 Dependence on renal dialysis: Secondary | ICD-10-CM | POA: Diagnosis not present

## 2022-08-01 DIAGNOSIS — E875 Hyperkalemia: Secondary | ICD-10-CM | POA: Diagnosis not present

## 2022-08-01 DIAGNOSIS — N2581 Secondary hyperparathyroidism of renal origin: Secondary | ICD-10-CM | POA: Diagnosis not present

## 2022-08-01 DIAGNOSIS — R519 Headache, unspecified: Secondary | ICD-10-CM | POA: Diagnosis not present

## 2022-08-01 DIAGNOSIS — D631 Anemia in chronic kidney disease: Secondary | ICD-10-CM | POA: Diagnosis not present

## 2022-08-01 DIAGNOSIS — N186 End stage renal disease: Secondary | ICD-10-CM | POA: Diagnosis not present

## 2022-08-01 DIAGNOSIS — L299 Pruritus, unspecified: Secondary | ICD-10-CM | POA: Diagnosis not present

## 2022-08-04 DIAGNOSIS — Z992 Dependence on renal dialysis: Secondary | ICD-10-CM | POA: Diagnosis not present

## 2022-08-04 DIAGNOSIS — D631 Anemia in chronic kidney disease: Secondary | ICD-10-CM | POA: Diagnosis not present

## 2022-08-04 DIAGNOSIS — N2581 Secondary hyperparathyroidism of renal origin: Secondary | ICD-10-CM | POA: Diagnosis not present

## 2022-08-04 DIAGNOSIS — R519 Headache, unspecified: Secondary | ICD-10-CM | POA: Diagnosis not present

## 2022-08-04 DIAGNOSIS — E875 Hyperkalemia: Secondary | ICD-10-CM | POA: Diagnosis not present

## 2022-08-04 DIAGNOSIS — R197 Diarrhea, unspecified: Secondary | ICD-10-CM | POA: Diagnosis not present

## 2022-08-04 DIAGNOSIS — N186 End stage renal disease: Secondary | ICD-10-CM | POA: Diagnosis not present

## 2022-08-04 DIAGNOSIS — L299 Pruritus, unspecified: Secondary | ICD-10-CM | POA: Diagnosis not present

## 2022-08-05 ENCOUNTER — Other Ambulatory Visit (HOSPITAL_COMMUNITY): Payer: Self-pay

## 2022-08-06 DIAGNOSIS — N2581 Secondary hyperparathyroidism of renal origin: Secondary | ICD-10-CM | POA: Diagnosis not present

## 2022-08-06 DIAGNOSIS — D631 Anemia in chronic kidney disease: Secondary | ICD-10-CM | POA: Diagnosis not present

## 2022-08-06 DIAGNOSIS — R197 Diarrhea, unspecified: Secondary | ICD-10-CM | POA: Diagnosis not present

## 2022-08-06 DIAGNOSIS — Z992 Dependence on renal dialysis: Secondary | ICD-10-CM | POA: Diagnosis not present

## 2022-08-06 DIAGNOSIS — N186 End stage renal disease: Secondary | ICD-10-CM | POA: Diagnosis not present

## 2022-08-06 DIAGNOSIS — E875 Hyperkalemia: Secondary | ICD-10-CM | POA: Diagnosis not present

## 2022-08-06 DIAGNOSIS — L299 Pruritus, unspecified: Secondary | ICD-10-CM | POA: Diagnosis not present

## 2022-08-06 DIAGNOSIS — R519 Headache, unspecified: Secondary | ICD-10-CM | POA: Diagnosis not present

## 2022-08-07 ENCOUNTER — Other Ambulatory Visit (HOSPITAL_COMMUNITY): Payer: Self-pay

## 2022-08-08 DIAGNOSIS — E875 Hyperkalemia: Secondary | ICD-10-CM | POA: Diagnosis not present

## 2022-08-08 DIAGNOSIS — D631 Anemia in chronic kidney disease: Secondary | ICD-10-CM | POA: Diagnosis not present

## 2022-08-08 DIAGNOSIS — R197 Diarrhea, unspecified: Secondary | ICD-10-CM | POA: Diagnosis not present

## 2022-08-08 DIAGNOSIS — Z992 Dependence on renal dialysis: Secondary | ICD-10-CM | POA: Diagnosis not present

## 2022-08-08 DIAGNOSIS — L299 Pruritus, unspecified: Secondary | ICD-10-CM | POA: Diagnosis not present

## 2022-08-08 DIAGNOSIS — N2581 Secondary hyperparathyroidism of renal origin: Secondary | ICD-10-CM | POA: Diagnosis not present

## 2022-08-08 DIAGNOSIS — N186 End stage renal disease: Secondary | ICD-10-CM | POA: Diagnosis not present

## 2022-08-08 DIAGNOSIS — R519 Headache, unspecified: Secondary | ICD-10-CM | POA: Diagnosis not present

## 2022-08-09 DIAGNOSIS — Z992 Dependence on renal dialysis: Secondary | ICD-10-CM | POA: Diagnosis not present

## 2022-08-09 DIAGNOSIS — E1129 Type 2 diabetes mellitus with other diabetic kidney complication: Secondary | ICD-10-CM | POA: Diagnosis not present

## 2022-08-09 DIAGNOSIS — N186 End stage renal disease: Secondary | ICD-10-CM | POA: Diagnosis not present

## 2022-08-11 DIAGNOSIS — R519 Headache, unspecified: Secondary | ICD-10-CM | POA: Diagnosis not present

## 2022-08-11 DIAGNOSIS — I120 Hypertensive chronic kidney disease with stage 5 chronic kidney disease or end stage renal disease: Secondary | ICD-10-CM | POA: Diagnosis not present

## 2022-08-11 DIAGNOSIS — N2581 Secondary hyperparathyroidism of renal origin: Secondary | ICD-10-CM | POA: Diagnosis not present

## 2022-08-11 DIAGNOSIS — L299 Pruritus, unspecified: Secondary | ICD-10-CM | POA: Diagnosis not present

## 2022-08-11 DIAGNOSIS — E1110 Type 2 diabetes mellitus with ketoacidosis without coma: Secondary | ICD-10-CM | POA: Diagnosis not present

## 2022-08-11 DIAGNOSIS — Z992 Dependence on renal dialysis: Secondary | ICD-10-CM | POA: Diagnosis not present

## 2022-08-11 DIAGNOSIS — D631 Anemia in chronic kidney disease: Secondary | ICD-10-CM | POA: Diagnosis not present

## 2022-08-11 DIAGNOSIS — R197 Diarrhea, unspecified: Secondary | ICD-10-CM | POA: Diagnosis not present

## 2022-08-11 DIAGNOSIS — N186 End stage renal disease: Secondary | ICD-10-CM | POA: Diagnosis not present

## 2022-08-14 ENCOUNTER — Other Ambulatory Visit: Payer: Self-pay | Admitting: Vascular Surgery

## 2022-08-14 DIAGNOSIS — I739 Peripheral vascular disease, unspecified: Secondary | ICD-10-CM

## 2022-08-15 DIAGNOSIS — I120 Hypertensive chronic kidney disease with stage 5 chronic kidney disease or end stage renal disease: Secondary | ICD-10-CM | POA: Diagnosis not present

## 2022-08-15 DIAGNOSIS — N2581 Secondary hyperparathyroidism of renal origin: Secondary | ICD-10-CM | POA: Diagnosis not present

## 2022-08-15 DIAGNOSIS — R519 Headache, unspecified: Secondary | ICD-10-CM | POA: Diagnosis not present

## 2022-08-15 DIAGNOSIS — N186 End stage renal disease: Secondary | ICD-10-CM | POA: Diagnosis not present

## 2022-08-15 DIAGNOSIS — L299 Pruritus, unspecified: Secondary | ICD-10-CM | POA: Diagnosis not present

## 2022-08-15 DIAGNOSIS — Z992 Dependence on renal dialysis: Secondary | ICD-10-CM | POA: Diagnosis not present

## 2022-08-15 DIAGNOSIS — D631 Anemia in chronic kidney disease: Secondary | ICD-10-CM | POA: Diagnosis not present

## 2022-08-15 DIAGNOSIS — E1110 Type 2 diabetes mellitus with ketoacidosis without coma: Secondary | ICD-10-CM | POA: Diagnosis not present

## 2022-08-15 DIAGNOSIS — R197 Diarrhea, unspecified: Secondary | ICD-10-CM | POA: Diagnosis not present

## 2022-08-17 ENCOUNTER — Encounter (HOSPITAL_COMMUNITY): Payer: Self-pay

## 2022-08-17 ENCOUNTER — Other Ambulatory Visit: Payer: Self-pay

## 2022-08-17 DIAGNOSIS — B20 Human immunodeficiency virus [HIV] disease: Secondary | ICD-10-CM

## 2022-08-17 MED ORDER — BIKTARVY 50-200-25 MG PO TABS: 1.0000 | ORAL_TABLET | Freq: Every day | ORAL | 0 refills | Status: DC

## 2022-08-18 DIAGNOSIS — R519 Headache, unspecified: Secondary | ICD-10-CM | POA: Diagnosis not present

## 2022-08-18 DIAGNOSIS — I120 Hypertensive chronic kidney disease with stage 5 chronic kidney disease or end stage renal disease: Secondary | ICD-10-CM | POA: Diagnosis not present

## 2022-08-18 DIAGNOSIS — Z992 Dependence on renal dialysis: Secondary | ICD-10-CM | POA: Diagnosis not present

## 2022-08-18 DIAGNOSIS — N186 End stage renal disease: Secondary | ICD-10-CM | POA: Diagnosis not present

## 2022-08-18 DIAGNOSIS — E1110 Type 2 diabetes mellitus with ketoacidosis without coma: Secondary | ICD-10-CM | POA: Diagnosis not present

## 2022-08-18 DIAGNOSIS — D631 Anemia in chronic kidney disease: Secondary | ICD-10-CM | POA: Diagnosis not present

## 2022-08-18 DIAGNOSIS — L299 Pruritus, unspecified: Secondary | ICD-10-CM | POA: Diagnosis not present

## 2022-08-18 DIAGNOSIS — R197 Diarrhea, unspecified: Secondary | ICD-10-CM | POA: Diagnosis not present

## 2022-08-18 DIAGNOSIS — N2581 Secondary hyperparathyroidism of renal origin: Secondary | ICD-10-CM | POA: Diagnosis not present

## 2022-08-20 DIAGNOSIS — N2581 Secondary hyperparathyroidism of renal origin: Secondary | ICD-10-CM | POA: Diagnosis not present

## 2022-08-20 DIAGNOSIS — Z992 Dependence on renal dialysis: Secondary | ICD-10-CM | POA: Diagnosis not present

## 2022-08-20 DIAGNOSIS — N186 End stage renal disease: Secondary | ICD-10-CM | POA: Diagnosis not present

## 2022-08-20 DIAGNOSIS — R197 Diarrhea, unspecified: Secondary | ICD-10-CM | POA: Diagnosis not present

## 2022-08-20 DIAGNOSIS — D631 Anemia in chronic kidney disease: Secondary | ICD-10-CM | POA: Diagnosis not present

## 2022-08-20 DIAGNOSIS — E1110 Type 2 diabetes mellitus with ketoacidosis without coma: Secondary | ICD-10-CM | POA: Diagnosis not present

## 2022-08-20 DIAGNOSIS — I120 Hypertensive chronic kidney disease with stage 5 chronic kidney disease or end stage renal disease: Secondary | ICD-10-CM | POA: Diagnosis not present

## 2022-08-20 DIAGNOSIS — R519 Headache, unspecified: Secondary | ICD-10-CM | POA: Diagnosis not present

## 2022-08-20 DIAGNOSIS — L299 Pruritus, unspecified: Secondary | ICD-10-CM | POA: Diagnosis not present

## 2022-08-21 ENCOUNTER — Other Ambulatory Visit (HOSPITAL_COMMUNITY): Payer: Self-pay

## 2022-08-22 DIAGNOSIS — R197 Diarrhea, unspecified: Secondary | ICD-10-CM | POA: Diagnosis not present

## 2022-08-22 DIAGNOSIS — E1110 Type 2 diabetes mellitus with ketoacidosis without coma: Secondary | ICD-10-CM | POA: Diagnosis not present

## 2022-08-22 DIAGNOSIS — R519 Headache, unspecified: Secondary | ICD-10-CM | POA: Diagnosis not present

## 2022-08-22 DIAGNOSIS — N2581 Secondary hyperparathyroidism of renal origin: Secondary | ICD-10-CM | POA: Diagnosis not present

## 2022-08-22 DIAGNOSIS — N186 End stage renal disease: Secondary | ICD-10-CM | POA: Diagnosis not present

## 2022-08-22 DIAGNOSIS — D631 Anemia in chronic kidney disease: Secondary | ICD-10-CM | POA: Diagnosis not present

## 2022-08-22 DIAGNOSIS — Z992 Dependence on renal dialysis: Secondary | ICD-10-CM | POA: Diagnosis not present

## 2022-08-22 DIAGNOSIS — L299 Pruritus, unspecified: Secondary | ICD-10-CM | POA: Diagnosis not present

## 2022-08-22 DIAGNOSIS — I120 Hypertensive chronic kidney disease with stage 5 chronic kidney disease or end stage renal disease: Secondary | ICD-10-CM | POA: Diagnosis not present

## 2022-08-25 DIAGNOSIS — I120 Hypertensive chronic kidney disease with stage 5 chronic kidney disease or end stage renal disease: Secondary | ICD-10-CM | POA: Diagnosis not present

## 2022-08-25 DIAGNOSIS — R197 Diarrhea, unspecified: Secondary | ICD-10-CM | POA: Diagnosis not present

## 2022-08-25 DIAGNOSIS — D631 Anemia in chronic kidney disease: Secondary | ICD-10-CM | POA: Diagnosis not present

## 2022-08-25 DIAGNOSIS — Z992 Dependence on renal dialysis: Secondary | ICD-10-CM | POA: Diagnosis not present

## 2022-08-25 DIAGNOSIS — R519 Headache, unspecified: Secondary | ICD-10-CM | POA: Diagnosis not present

## 2022-08-25 DIAGNOSIS — N186 End stage renal disease: Secondary | ICD-10-CM | POA: Diagnosis not present

## 2022-08-25 DIAGNOSIS — L299 Pruritus, unspecified: Secondary | ICD-10-CM | POA: Diagnosis not present

## 2022-08-25 DIAGNOSIS — N2581 Secondary hyperparathyroidism of renal origin: Secondary | ICD-10-CM | POA: Diagnosis not present

## 2022-08-25 DIAGNOSIS — E1110 Type 2 diabetes mellitus with ketoacidosis without coma: Secondary | ICD-10-CM | POA: Diagnosis not present

## 2022-08-26 ENCOUNTER — Other Ambulatory Visit: Payer: Self-pay

## 2022-08-26 ENCOUNTER — Ambulatory Visit (INDEPENDENT_AMBULATORY_CARE_PROVIDER_SITE_OTHER): Payer: 59 | Admitting: Internal Medicine

## 2022-08-26 ENCOUNTER — Other Ambulatory Visit (HOSPITAL_COMMUNITY): Payer: Self-pay

## 2022-08-26 ENCOUNTER — Encounter: Payer: Self-pay | Admitting: Internal Medicine

## 2022-08-26 ENCOUNTER — Encounter (HOSPITAL_COMMUNITY): Payer: Self-pay

## 2022-08-26 VITALS — BP 153/80 | HR 88 | Temp 99.0°F

## 2022-08-26 DIAGNOSIS — B20 Human immunodeficiency virus [HIV] disease: Secondary | ICD-10-CM

## 2022-08-26 DIAGNOSIS — Z5900 Homelessness unspecified: Secondary | ICD-10-CM

## 2022-08-26 DIAGNOSIS — F331 Major depressive disorder, recurrent, moderate: Secondary | ICD-10-CM | POA: Diagnosis not present

## 2022-08-26 MED ORDER — BIKTARVY 50-200-25 MG PO TABS
1.0000 | ORAL_TABLET | Freq: Every day | ORAL | 11 refills | Status: DC
  Filled 2022-08-26 – 2022-08-31 (×2): qty 30, 30d supply, fill #0
  Filled 2022-10-08 (×2): qty 30, 30d supply, fill #1
  Filled 2022-11-02: qty 30, 30d supply, fill #2
  Filled 2022-12-01: qty 30, 30d supply, fill #3
  Filled 2022-12-29: qty 30, 30d supply, fill #4

## 2022-08-26 NOTE — Progress Notes (Signed)
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   Urinary and fecal incontinence 05/30/2021   Epistaxis 05/27/2021   Status post coronary artery stent placement    Coronary artery disease involving native coronary artery of native heart with unstable angina pectoris ([place])    Hypertensive urgency 05/20/2021   History of non-ST elevation myocardial infarction (NSTEMI) 05/20/2021   BKA stump complication ([place]) 56/43/3295   Right below-knee amputee ([place]) 02/08/2021   Peripheral arterial disease ([place]) 01/29/2021   Patient's other noncompliance with medication regimen 12/28/2019   Major depressive disorder, single episode, unspecified 04/24/2019   Secondary hyperparathyroidism of renal origin ([place]) 04/24/2019   S/P AKA (above knee amputation), left ([place]) 12/16/2018   Anemia due to chronic kidney disease    Moderate protein-calorie malnutrition (HCC)    Adjustment disorder with mixed anxiety and depressed mood 08/15/2018   ESRD (end stage renal disease) ([place])    Fecal incontinence 02/04/2018   Aortic atherosclerosis ([place]) 09/30/2016   Gastroparesis    Moderate nonproliferative diabetic retinopathy of both eyes ([place]) 11/21/2014   Esophageal reflux    Essential hypertension 11/16/2013   Normocytic anemia 11/15/2013   Diabetes mellitus type 2 in obese ([place]) 06/28/1994    Patient's Medications  New Prescriptions   No medications on file  Previous Medications   ACCU-CHEK SOFTCLIX LANCETS LANCETS    Use as instructed   ALBUTEROL (VENTOLIN HFA) 108 (90 BASE)  MCG/ACT INHALER    Inhale 2 puffs into the lungs every 4 (four) hours as needed for wheezing or shortness of breath.   AMLODIPINE (NORVASC) 10 MG TABLET    Take 1 tablet (10 mg total) by mouth daily.   AMLODIPINE-OLMESARTAN (AZOR) 10-40 MG TABLET    Take by mouth.   AMOXICILLIN-CLAVULANATE (AUGMENTIN) 500-125 MG TABLET    Take 1 tablet by mouth at bedtime.   ATORVASTATIN (LIPITOR) 80 MG TABLET    Take 1 tablet (80 mg total) by mouth daily.   CALCITRIOL (ROCALTROL) 0.25 MCG CAPSULE    Take 5 capsules (1.25 mcg total) by mouth Every Tuesday,Thursday,and Saturday with dialysis.   CALCIUM ACETATE (PHOSLO) 667 MG CAPSULE    Take 2 capsules (1,334 mg total) by mouth 3 (three) times daily with meals.   CONTINUOUS BLOOD GLUC RECEIVER (DEXCOM G7 RECEIVER) DEVI    Use to check blood sugars cntinuously   CONTINUOUS BLOOD GLUC SENSOR (DEXCOM G7 SENSOR) MISC    Use to check blood sugar continuously   CYCLOBENZAPRINE (FLEXERIL) 5 MG TABLET    Take 5 mg by mouth 2 (two) times daily as needed for muscle spasms.   DARBEPOETIN ALFA (ARANESP) 200 MCG/0.4ML SOSY INJECTION    Inject 0.4 mLs (200 mcg total) into the skin every Monday at 6 PM.   EQ ASPIRIN ADULT LOW DOSE 81 MG TABLET    TAKE 1 TABLET BY MOUTH ONCE DAILY. SWALLOW WHOLE.   GLUCOSE BLOOD TEST STRIP    Use as instructed   INSULIN GLARGINE (LANTUS SOLOSTAR) 100 UNIT/ML SOLOSTAR PEN    Inject 15  Units into the skin at bedtime.   INSULIN PEN NEEDLE (PENTIPS) 32G X 4 MM MISC    Use as directed   LIRAGLUTIDE (VICTOZA) 18 MG/3ML SOPN    Inject 0.6 mg into the skin daily.   LOKELMA 5 G PACKET    Take by mouth.   MULTIVITAMIN (RENA-VIT) TABS TABLET    Take 1 tablet by mouth daily.   POLYETHYLENE GLYCOL (MIRALAX / GLYCOLAX) 17 G PACKET    Take 17 g by mouth daily.  Modified Medications   Modified Medication Previous Medication   BICTEGRAVIR-EMTRICITABINE-TENOFOVIR AF (BIKTARVY) 50-200-25 MG TABS TABLET bictegravir-emtricitabine-tenofovir AF (BIKTARVY) 50-200-25 MG  TABS tablet      Take 1 tablet by mouth daily. OVERDUE FOR OFFICE FOLLOW UP - PLEASE CALL OUR OFFICE TO SCHEDULE [phone]    Take 1 tablet by mouth daily. OVERDUE FOR OFFICE FOLLOW UP - PLEASE CALL OUR OFFICE TO SCHEDULE [phone]  Discontinued Medications   No medications on file    Subjective: Kathy Frank is in for her routine HIV follow-up visit.  She denies any problems obtaining, taking or tolerating her Biktarvy and says that the only time she misses it is if she is in the emergency department and they do not give it to her.  She continues to struggle with depression, anxiety and panic attacks.  She says that this has gotten worse recently.  She had been living with her mother but when her mother moved to [place] recently she was suddenly homeless.  She has been living with her daughter and her daughter's boyfriend but says that this is not a good situation for her.  She has had some problems with Medicaid and transportation.  Review of Systems: Review of Systems  Constitutional:  Positive for malaise/fatigue. Negative for fever and weight loss.  Psychiatric/Behavioral:  Positive for depression. The patient is nervous/anxious and has insomnia.     Past Medical History:  Diagnosis Date   Anal abscess    chronic   Anxiety    CAD (coronary artery disease)    CVA (cerebral vascular accident) ([place])    Depression 06/28/2006   Qualifier: Diagnosis of  By: Riccardo Dubin MD, [place]     Diabetes mellitus type 2 in obese ([place]) 06/28/1994   Dyspnea    uses oxygeb 2 liters per minute at dialysis   End stage renal disease on dialysis [facility])    on hemodialysis T/Th/Sat   Erosive esophagitis    Esophageal reflux    Eye redness    Gastroparesis    ? diabetic   GERD (gastroesophageal reflux disease)    History of blood transfusion 2019   Human immunodeficiency virus (HIV) disease ([place]) 04/23/2016   Hyperlipidemia    Hypertension    Metabolic bone disease 85/27/7824   Moderate nonproliferative  diabetic retinopathy of both eyes ([place]) 11/21/2014   11/14/14: Noted on retinal imaging; needs follow-up imaging in 6 months  05/22/16: Noted on retinal imaging again; needs follow-up imaging in 6 months   PAD (peripheral artery disease) ([place])    Type 2 diabetes mellitus with diabetic peripheral angiopathy without gangrene ([place]) 05/01/2019   Wears dentures    lower   Wound infection s/p L transmetatarsal amputation     Social History   Tobacco Use   Smoking status: Former    Packs/day: 0.10    Years: 10.00    Total pack years: 1.00    Types: Cigarettes    Start date: 11/17/2013    Quit date: 09/09/2014  Years since quitting: 7.9   Smokeless tobacco: Never  Vaping Use   Vaping Use: Never used  Substance Use Topics   Alcohol use: Not Currently   Drug use: Yes    Types: Marijuana    Comment: 2 times a monthy -  `last time Juily 2022    Family History  Problem Relation Age of Onset   Diabetes Mother    Diabetes Brother    Diabetes Daughter    Diabetes Daughter    Mental retardation Brother        died from PNA   Diabetes Maternal Grandmother     No Known Allergies  Health Maintenance  Topic Date Due   Medicare Annual Wellness (AWV)  Never done   OPHTHALMOLOGY EXAM  05/22/2017   COVID-19 Vaccine (2 - Moderna risk series) 11/28/2019   PAP SMEAR-Modifier  02/04/2021   FOOT EXAM  05/13/2021   COLONOSCOPY (Pts 45-35yrs Insurance coverage will need to be confirmed)  Never done   LIPID PANEL  05/22/2022   HEMOGLOBIN A1C  09/27/2022   DTaP/Tdap/Td (2 - Td or Tdap) 09/18/2027   INFLUENZA VACCINE  Completed   Hepatitis C Screening  Completed   HIV Screening  Completed   HPV VACCINES  Aged Out    Objective:  Vitals:   08/26/22 1043  BP: (!) 153/80  Pulse: 88  Temp: 99 F (37.2 C)  TempSrc: Oral  SpO2: 98%   There is no height or weight on file to calculate BMI.  Physical Exam Constitutional:      Comments: She is quiet but pleasant.  She is seated in her  wheelchair.  She became tearful when she started talking about her recent problems.  Cardiovascular:     Rate and Rhythm: Normal rate and regular rhythm.  Pulmonary:     Effort: Pulmonary effort is normal.     Breath sounds: Normal breath sounds.     Lab Results Lab Results  Component Value Date   WBC 6.7 06/30/2022   HGB 7.7 (L) 06/30/2022   HCT 23.3 (L) 06/30/2022   MCV 91.7 06/30/2022   PLT 405 (H) 06/30/2022    Lab Results  Component Value Date   CREATININE 5.49 (H) 06/30/2022   BUN 29 (H) 06/30/2022   NA 135 06/30/2022   K 4.4 06/30/2022   CL 90 (L) 06/30/2022   CO2 27 06/30/2022    Lab Results  Component Value Date   ALT 8 04/27/2022   AST 11 (L) 04/27/2022   ALKPHOS 38 04/27/2022   BILITOT 0.3 04/27/2022    Lab Results  Component Value Date   CHOL 177 05/22/2021   HDL 38 (L) 05/22/2021   LDLCALC 96 05/22/2021   TRIG 214 (H) 05/22/2021   CHOLHDL 4.7 05/22/2021   Lab Results  Component Value Date   LABRPR NON REACTIVE 04/22/2021   HIV 1 RNA Quant  Date Value  08/13/2021 Not Detected Copies/mL  04/22/2021 <20 copies/mL  02/13/2021 <20 copies/mL   HIV-1 RNA Viral Load (copies/mL)  Date Value  12/23/2017 80  02/02/2017 80   CD4 T Cell Abs (/uL)  Date Value  08/13/2021 623  05/29/2020 563  02/23/2019 526     Problem List Items Addressed This Visit       High   Depression (Chronic)    She has acute on chronic depression and anxiety.  I will try out, again, to arrange a visit with our behavioral health counselor to help identify resources for her in  the community.        Medium    Human immunodeficiency virus (HIV) disease ([place]) - Primary (Chronic)    Her infection has been under excellent, long-term control.  She will get repeat blood work today, continue Airline pilot and follow-up in 1 year.      Relevant Medications   bictegravir-emtricitabine-tenofovir AF (BIKTARVY) 50-200-25 MG TABS tablet   Other Relevant Orders   T-helper cells (CD4)  count (not at [facility])   HIV-1 RNA quant-no reflex-bld   RPR   AMB REFERRAL TO COMMUNITY SERVICE AGENCY     Unprioritized   Homelessness    I will place a referral to our bridge counselor to see if we can assist her in finding safe and stable housing.         Michel Bickers, MD [facility] [place] [facility] [phone] pager   [phone] cell 08/26/2022, 11:10 AM

## 2022-08-26 NOTE — Assessment & Plan Note (Signed)
She has acute on chronic depression and anxiety.  I will try out, again, to arrange a visit with our behavioral health counselor to help identify resources for her in the community.

## 2022-08-26 NOTE — Assessment & Plan Note (Signed)
Her infection has been under excellent, long-term control.  She will get repeat blood work today, continue Airline pilot and follow-up in 1 year.

## 2022-08-26 NOTE — Assessment & Plan Note (Signed)
I will place a referral to our bridge counselor to see if we can assist her in finding safe and stable housing.

## 2022-08-27 DIAGNOSIS — D631 Anemia in chronic kidney disease: Secondary | ICD-10-CM | POA: Diagnosis not present

## 2022-08-27 DIAGNOSIS — Z992 Dependence on renal dialysis: Secondary | ICD-10-CM | POA: Diagnosis not present

## 2022-08-27 DIAGNOSIS — R197 Diarrhea, unspecified: Secondary | ICD-10-CM | POA: Diagnosis not present

## 2022-08-27 DIAGNOSIS — N2581 Secondary hyperparathyroidism of renal origin: Secondary | ICD-10-CM | POA: Diagnosis not present

## 2022-08-27 DIAGNOSIS — R519 Headache, unspecified: Secondary | ICD-10-CM | POA: Diagnosis not present

## 2022-08-27 DIAGNOSIS — N186 End stage renal disease: Secondary | ICD-10-CM | POA: Diagnosis not present

## 2022-08-27 DIAGNOSIS — E1110 Type 2 diabetes mellitus with ketoacidosis without coma: Secondary | ICD-10-CM | POA: Diagnosis not present

## 2022-08-27 DIAGNOSIS — I120 Hypertensive chronic kidney disease with stage 5 chronic kidney disease or end stage renal disease: Secondary | ICD-10-CM | POA: Diagnosis not present

## 2022-08-27 DIAGNOSIS — L299 Pruritus, unspecified: Secondary | ICD-10-CM | POA: Diagnosis not present

## 2022-08-27 LAB — T-HELPER CELLS (CD4) COUNT (NOT AT ARMC)
CD4 % Helper T Cell: 35 % (ref 33–65)
CD4 T Cell Abs: 397 /uL — ABNORMAL LOW (ref 400–1790)

## 2022-08-29 DIAGNOSIS — N186 End stage renal disease: Secondary | ICD-10-CM | POA: Diagnosis not present

## 2022-08-29 DIAGNOSIS — D631 Anemia in chronic kidney disease: Secondary | ICD-10-CM | POA: Diagnosis not present

## 2022-08-29 DIAGNOSIS — R519 Headache, unspecified: Secondary | ICD-10-CM | POA: Diagnosis not present

## 2022-08-29 DIAGNOSIS — N2581 Secondary hyperparathyroidism of renal origin: Secondary | ICD-10-CM | POA: Diagnosis not present

## 2022-08-29 DIAGNOSIS — I120 Hypertensive chronic kidney disease with stage 5 chronic kidney disease or end stage renal disease: Secondary | ICD-10-CM | POA: Diagnosis not present

## 2022-08-29 DIAGNOSIS — L299 Pruritus, unspecified: Secondary | ICD-10-CM | POA: Diagnosis not present

## 2022-08-29 DIAGNOSIS — Z992 Dependence on renal dialysis: Secondary | ICD-10-CM | POA: Diagnosis not present

## 2022-08-29 DIAGNOSIS — R197 Diarrhea, unspecified: Secondary | ICD-10-CM | POA: Diagnosis not present

## 2022-08-29 DIAGNOSIS — E1110 Type 2 diabetes mellitus with ketoacidosis without coma: Secondary | ICD-10-CM | POA: Diagnosis not present

## 2022-08-29 LAB — HIV-1 RNA QUANT-NO REFLEX-BLD
HIV 1 RNA Quant: NOT DETECTED Copies/mL
HIV-1 RNA Quant, Log: NOT DETECTED Log cps/mL

## 2022-08-29 LAB — RPR: RPR Ser Ql: NONREACTIVE

## 2022-08-31 ENCOUNTER — Other Ambulatory Visit (HOSPITAL_COMMUNITY): Payer: Self-pay

## 2022-08-31 ENCOUNTER — Other Ambulatory Visit: Payer: Self-pay

## 2022-09-01 DIAGNOSIS — N2581 Secondary hyperparathyroidism of renal origin: Secondary | ICD-10-CM | POA: Diagnosis not present

## 2022-09-01 DIAGNOSIS — L299 Pruritus, unspecified: Secondary | ICD-10-CM | POA: Diagnosis not present

## 2022-09-01 DIAGNOSIS — N186 End stage renal disease: Secondary | ICD-10-CM | POA: Diagnosis not present

## 2022-09-01 DIAGNOSIS — D631 Anemia in chronic kidney disease: Secondary | ICD-10-CM | POA: Diagnosis not present

## 2022-09-01 DIAGNOSIS — I120 Hypertensive chronic kidney disease with stage 5 chronic kidney disease or end stage renal disease: Secondary | ICD-10-CM | POA: Diagnosis not present

## 2022-09-01 DIAGNOSIS — Z992 Dependence on renal dialysis: Secondary | ICD-10-CM | POA: Diagnosis not present

## 2022-09-01 DIAGNOSIS — R519 Headache, unspecified: Secondary | ICD-10-CM | POA: Diagnosis not present

## 2022-09-01 DIAGNOSIS — E1110 Type 2 diabetes mellitus with ketoacidosis without coma: Secondary | ICD-10-CM | POA: Diagnosis not present

## 2022-09-01 DIAGNOSIS — R197 Diarrhea, unspecified: Secondary | ICD-10-CM | POA: Diagnosis not present

## 2022-09-03 DIAGNOSIS — R519 Headache, unspecified: Secondary | ICD-10-CM | POA: Diagnosis not present

## 2022-09-03 DIAGNOSIS — N2581 Secondary hyperparathyroidism of renal origin: Secondary | ICD-10-CM | POA: Diagnosis not present

## 2022-09-03 DIAGNOSIS — E1110 Type 2 diabetes mellitus with ketoacidosis without coma: Secondary | ICD-10-CM | POA: Diagnosis not present

## 2022-09-03 DIAGNOSIS — D631 Anemia in chronic kidney disease: Secondary | ICD-10-CM | POA: Diagnosis not present

## 2022-09-03 DIAGNOSIS — L299 Pruritus, unspecified: Secondary | ICD-10-CM | POA: Diagnosis not present

## 2022-09-03 DIAGNOSIS — R197 Diarrhea, unspecified: Secondary | ICD-10-CM | POA: Diagnosis not present

## 2022-09-03 DIAGNOSIS — N186 End stage renal disease: Secondary | ICD-10-CM | POA: Diagnosis not present

## 2022-09-03 DIAGNOSIS — Z992 Dependence on renal dialysis: Secondary | ICD-10-CM | POA: Diagnosis not present

## 2022-09-03 DIAGNOSIS — I120 Hypertensive chronic kidney disease with stage 5 chronic kidney disease or end stage renal disease: Secondary | ICD-10-CM | POA: Diagnosis not present

## 2022-09-05 DIAGNOSIS — L299 Pruritus, unspecified: Secondary | ICD-10-CM | POA: Diagnosis not present

## 2022-09-05 DIAGNOSIS — E1110 Type 2 diabetes mellitus with ketoacidosis without coma: Secondary | ICD-10-CM | POA: Diagnosis not present

## 2022-09-05 DIAGNOSIS — I120 Hypertensive chronic kidney disease with stage 5 chronic kidney disease or end stage renal disease: Secondary | ICD-10-CM | POA: Diagnosis not present

## 2022-09-05 DIAGNOSIS — R519 Headache, unspecified: Secondary | ICD-10-CM | POA: Diagnosis not present

## 2022-09-05 DIAGNOSIS — Z992 Dependence on renal dialysis: Secondary | ICD-10-CM | POA: Diagnosis not present

## 2022-09-05 DIAGNOSIS — N2581 Secondary hyperparathyroidism of renal origin: Secondary | ICD-10-CM | POA: Diagnosis not present

## 2022-09-05 DIAGNOSIS — N186 End stage renal disease: Secondary | ICD-10-CM | POA: Diagnosis not present

## 2022-09-05 DIAGNOSIS — R197 Diarrhea, unspecified: Secondary | ICD-10-CM | POA: Diagnosis not present

## 2022-09-05 DIAGNOSIS — D631 Anemia in chronic kidney disease: Secondary | ICD-10-CM | POA: Diagnosis not present

## 2022-09-08 ENCOUNTER — Ambulatory Visit: Payer: 59

## 2022-09-08 ENCOUNTER — Other Ambulatory Visit: Payer: Self-pay

## 2022-09-08 DIAGNOSIS — R519 Headache, unspecified: Secondary | ICD-10-CM | POA: Diagnosis not present

## 2022-09-08 DIAGNOSIS — E1110 Type 2 diabetes mellitus with ketoacidosis without coma: Secondary | ICD-10-CM | POA: Diagnosis not present

## 2022-09-08 DIAGNOSIS — Z992 Dependence on renal dialysis: Secondary | ICD-10-CM | POA: Diagnosis not present

## 2022-09-08 DIAGNOSIS — L299 Pruritus, unspecified: Secondary | ICD-10-CM | POA: Diagnosis not present

## 2022-09-08 DIAGNOSIS — R197 Diarrhea, unspecified: Secondary | ICD-10-CM | POA: Diagnosis not present

## 2022-09-08 DIAGNOSIS — N186 End stage renal disease: Secondary | ICD-10-CM | POA: Diagnosis not present

## 2022-09-08 DIAGNOSIS — I120 Hypertensive chronic kidney disease with stage 5 chronic kidney disease or end stage renal disease: Secondary | ICD-10-CM | POA: Diagnosis not present

## 2022-09-08 DIAGNOSIS — D631 Anemia in chronic kidney disease: Secondary | ICD-10-CM | POA: Diagnosis not present

## 2022-09-08 DIAGNOSIS — N2581 Secondary hyperparathyroidism of renal origin: Secondary | ICD-10-CM | POA: Diagnosis not present

## 2022-09-09 ENCOUNTER — Telehealth: Payer: Self-pay

## 2022-09-09 ENCOUNTER — Other Ambulatory Visit: Payer: Self-pay

## 2022-09-09 DIAGNOSIS — N186 End stage renal disease: Secondary | ICD-10-CM | POA: Diagnosis not present

## 2022-09-09 DIAGNOSIS — E1129 Type 2 diabetes mellitus with other diabetic kidney complication: Secondary | ICD-10-CM | POA: Diagnosis not present

## 2022-09-09 DIAGNOSIS — Z992 Dependence on renal dialysis: Secondary | ICD-10-CM | POA: Diagnosis not present

## 2022-09-09 NOTE — Telephone Encounter (Signed)
Patient called office to follow up on appointment she had yesterday. States that she discuss her anxiety with Arbie Cookey, Counselor wanted to confirm she updated her provider.  Informed patient that note is still pending, but would reach out to [place] to confirm she does this. Leatrice Jewels, RMA

## 2022-09-10 ENCOUNTER — Other Ambulatory Visit (HOSPITAL_COMMUNITY): Payer: Self-pay

## 2022-09-10 DIAGNOSIS — D631 Anemia in chronic kidney disease: Secondary | ICD-10-CM | POA: Diagnosis not present

## 2022-09-10 DIAGNOSIS — R519 Headache, unspecified: Secondary | ICD-10-CM | POA: Diagnosis not present

## 2022-09-10 DIAGNOSIS — Z992 Dependence on renal dialysis: Secondary | ICD-10-CM | POA: Diagnosis not present

## 2022-09-10 DIAGNOSIS — R197 Diarrhea, unspecified: Secondary | ICD-10-CM | POA: Diagnosis not present

## 2022-09-10 DIAGNOSIS — N2581 Secondary hyperparathyroidism of renal origin: Secondary | ICD-10-CM | POA: Diagnosis not present

## 2022-09-10 DIAGNOSIS — N186 End stage renal disease: Secondary | ICD-10-CM | POA: Diagnosis not present

## 2022-09-12 DIAGNOSIS — R197 Diarrhea, unspecified: Secondary | ICD-10-CM | POA: Diagnosis not present

## 2022-09-12 DIAGNOSIS — R519 Headache, unspecified: Secondary | ICD-10-CM | POA: Diagnosis not present

## 2022-09-12 DIAGNOSIS — N2581 Secondary hyperparathyroidism of renal origin: Secondary | ICD-10-CM | POA: Diagnosis not present

## 2022-09-12 DIAGNOSIS — N186 End stage renal disease: Secondary | ICD-10-CM | POA: Diagnosis not present

## 2022-09-12 DIAGNOSIS — Z992 Dependence on renal dialysis: Secondary | ICD-10-CM | POA: Diagnosis not present

## 2022-09-12 DIAGNOSIS — D631 Anemia in chronic kidney disease: Secondary | ICD-10-CM | POA: Diagnosis not present

## 2022-09-15 ENCOUNTER — Other Ambulatory Visit: Payer: Self-pay

## 2022-09-15 ENCOUNTER — Emergency Department (HOSPITAL_COMMUNITY)
Admission: EM | Admit: 2022-09-15 | Discharge: 2022-09-15 | Disposition: A | Discharge status: Home / Self Care | Payer: 59 | Attending: Emergency Medicine | Admitting: Emergency Medicine

## 2022-09-15 ENCOUNTER — Emergency Department (HOSPITAL_COMMUNITY): Payer: 59

## 2022-09-15 DIAGNOSIS — N2581 Secondary hyperparathyroidism of renal origin: Secondary | ICD-10-CM | POA: Diagnosis not present

## 2022-09-15 DIAGNOSIS — N186 End stage renal disease: Secondary | ICD-10-CM | POA: Insufficient documentation

## 2022-09-15 DIAGNOSIS — Z20822 Contact with and (suspected) exposure to covid-19: Secondary | ICD-10-CM | POA: Insufficient documentation

## 2022-09-15 DIAGNOSIS — Z992 Dependence on renal dialysis: Secondary | ICD-10-CM | POA: Insufficient documentation

## 2022-09-15 DIAGNOSIS — Z79899 Other long term (current) drug therapy: Secondary | ICD-10-CM | POA: Diagnosis not present

## 2022-09-15 DIAGNOSIS — G43909 Migraine, unspecified, not intractable, without status migrainosus: Secondary | ICD-10-CM | POA: Insufficient documentation

## 2022-09-15 DIAGNOSIS — D631 Anemia in chronic kidney disease: Secondary | ICD-10-CM | POA: Diagnosis not present

## 2022-09-15 DIAGNOSIS — I2510 Atherosclerotic heart disease of native coronary artery without angina pectoris: Secondary | ICD-10-CM | POA: Insufficient documentation

## 2022-09-15 DIAGNOSIS — R519 Headache, unspecified: Secondary | ICD-10-CM | POA: Diagnosis not present

## 2022-09-15 DIAGNOSIS — G4489 Other headache syndrome: Secondary | ICD-10-CM | POA: Diagnosis not present

## 2022-09-15 DIAGNOSIS — R531 Weakness: Secondary | ICD-10-CM

## 2022-09-15 DIAGNOSIS — R197 Diarrhea, unspecified: Secondary | ICD-10-CM | POA: Diagnosis not present

## 2022-09-15 DIAGNOSIS — I120 Hypertensive chronic kidney disease with stage 5 chronic kidney disease or end stage renal disease: Secondary | ICD-10-CM | POA: Diagnosis not present

## 2022-09-15 DIAGNOSIS — R000 Tachycardia, unspecified: Secondary | ICD-10-CM | POA: Insufficient documentation

## 2022-09-15 DIAGNOSIS — R5383 Other fatigue: Secondary | ICD-10-CM | POA: Diagnosis not present

## 2022-09-15 DIAGNOSIS — I959 Hypotension, unspecified: Secondary | ICD-10-CM | POA: Diagnosis not present

## 2022-09-15 DIAGNOSIS — Z794 Long term (current) use of insulin: Secondary | ICD-10-CM | POA: Insufficient documentation

## 2022-09-15 DIAGNOSIS — R059 Cough, unspecified: Secondary | ICD-10-CM | POA: Diagnosis not present

## 2022-09-15 DIAGNOSIS — Z21 Asymptomatic human immunodeficiency virus [HIV] infection status: Secondary | ICD-10-CM | POA: Insufficient documentation

## 2022-09-15 LAB — COMPREHENSIVE METABOLIC PANEL
ALT: 11 U/L (ref 0–44)
AST: 16 U/L (ref 15–41)
Albumin: 3.3 g/dL — ABNORMAL LOW (ref 3.5–5.0)
Alkaline Phosphatase: 42 U/L (ref 38–126)
Anion gap: 19 — ABNORMAL HIGH (ref 5–15)
BUN: 23 mg/dL — ABNORMAL HIGH (ref 6–20)
CO2: 24 mmol/L (ref 22–32)
Calcium: 10.1 mg/dL (ref 8.9–10.3)
Chloride: 94 mmol/L — ABNORMAL LOW (ref 98–111)
Creatinine, Ser: 4.21 mg/dL — ABNORMAL HIGH (ref 0.44–1.00)
GFR, Estimated: 13 mL/min — ABNORMAL LOW (ref >60)
Glucose, Bld: 202 mg/dL — ABNORMAL HIGH (ref 70–99)
Potassium: 3.7 mmol/L (ref 3.5–5.1)
Sodium: 137 mmol/L (ref 135–145)
Total Bilirubin: 0.3 mg/dL (ref 0.3–1.2)
Total Protein: 8.4 g/dL — ABNORMAL HIGH (ref 6.5–8.1)

## 2022-09-15 LAB — RESP PANEL BY RT-PCR (RSV, FLU A&B, COVID)  RVPGX2
Influenza A by PCR: NEGATIVE
Influenza B by PCR: NEGATIVE
Resp Syncytial Virus by PCR: NEGATIVE
SARS Coronavirus 2 by RT PCR: NEGATIVE

## 2022-09-15 LAB — CBC
HCT: 25.5 % — ABNORMAL LOW (ref 36.0–46.0)
Hemoglobin: 8 g/dL — ABNORMAL LOW (ref 12.0–15.0)
MCH: 30.9 pg (ref 26.0–34.0)
MCHC: 31.4 g/dL (ref 30.0–36.0)
MCV: 98.5 fL (ref 80.0–100.0)
Platelets: 362 10*3/uL (ref 150–400)
RBC: 2.59 MIL/uL — ABNORMAL LOW (ref 3.87–5.11)
RDW: 16.8 % — ABNORMAL HIGH (ref 11.5–15.5)
WBC: 6.6 10*3/uL (ref 4.0–10.5)
nRBC: 1.5 % — ABNORMAL HIGH (ref 0.0–0.2)

## 2022-09-15 LAB — POC OCCULT BLOOD, ED: Fecal Occult Bld: NEGATIVE

## 2022-09-15 MED ORDER — METOCLOPRAMIDE HCL 5 MG/ML IJ SOLN
10.0000 mg | Freq: Once | INTRAMUSCULAR | Status: AC
  Administered 2022-09-15: 10 mg via INTRAVENOUS
  Filled 2022-09-15: qty 2

## 2022-09-15 MED ORDER — DIPHENHYDRAMINE HCL 50 MG/ML IJ SOLN
25.0000 mg | Freq: Once | INTRAMUSCULAR | Status: AC
  Administered 2022-09-15: 25 mg via INTRAVENOUS
  Filled 2022-09-15: qty 1

## 2022-09-15 NOTE — ED Provider Notes (Signed)
[place] Provider Note   CSN: [id number] Arrival date & time: 09/15/22  1232     History  Chief Complaint  Patient presents with   Weakness    Kathy Frank is a 47 y.o. female with history of diabetic gastroparesis, erosive esophagitis, GERD, depression, HIV, s/p bilateral above-the-knee amputations, diabetic retinopathy, ESRD on dialysis T/R/S, CAD, PAD, hypertension, CVA, hyperlipidemia who presents to the emergency department complaining of generalized weakness.  Patient finished her scheduled dialysis appointment this morning.  She states that the dialysis staff was commenting that she was having difficulty keeping her eyes open and staying awake.  She says she was able to finish the full dialysis treatment today, and has not missed any other treatments.  No chest pain, blurry vision, lightheadedness. Patient herself is felt very tired since she woke up this morning.  Complaining of a headache, similar to her migraines.  Used to take [facility] powder for these, but was told to stop this after she got a nosebleed.  She says she cannot take Tylenol because it does not help.  Has not followed up with her doctor about this because she lost transportation.    Weakness Associated symptoms: headaches        Home Medications Prior to Admission medications   Medication Sig Start Date End Date Taking? Authorizing Provider  Accu-Chek Softclix Lancets lancets Use as instructed 05/02/21   Atway, Rayann N, DO  albuterol (VENTOLIN HFA) 108 (90 Base) MCG/ACT inhaler Inhale 2 puffs into the lungs every 4 (four) hours as needed for wheezing or shortness of breath. 02/14/21   Angiulli, Lavon Paganini, PA-C  amLODipine (NORVASC) 10 MG tablet Take 1 tablet (10 mg total) by mouth daily. Patient not taking: Reported on 08/26/2022 05/15/21   [name], MD  amLODipine-olmesartan (AZOR) 10-40 MG tablet Take by mouth. 07/16/22   [provider]   amoxicillin-clavulanate (AUGMENTIN) 500-125 MG tablet Take 1 tablet by mouth at bedtime. Patient not taking: Reported on 08/26/2022 06/26/22   Virl Axe, MD  atorvastatin (LIPITOR) 80 MG tablet Take 1 tablet (80 mg total) by mouth daily. Patient not taking: Reported on 06/28/2022 05/24/21   Masters, Joellen Jersey, DO  bictegravir-emtricitabine-tenofovir AF (BIKTARVY) 50-200-25 MG TABS tablet Take 1 tablet by mouth daily. OVERDUE FOR OFFICE FOLLOW UP - PLEASE CALL OUR OFFICE TO SCHEDULE [phone] 08/26/22   Michel Bickers, MD  calcitRIOL (ROCALTROL) 0.25 MCG capsule Take 5 capsules (1.25 mcg total) by mouth Every Tuesday,Thursday,and Saturday with dialysis. 07/02/22   Rick Duff, MD  calcium acetate (PHOSLO) 667 MG capsule Take 2 capsules (1,334 mg total) by mouth 3 (three) times daily with meals. 06/30/22   Rick Duff, MD  Continuous Blood Gluc Receiver (DEXCOM G7 RECEIVER) DEVI Use to check blood sugars cntinuously 06/30/22   Rick Duff, MD  Continuous Blood Gluc Sensor (DEXCOM G7 SENSOR) MISC Use to check blood sugar continuously 06/30/22   Rick Duff, MD  cyclobenzaprine (FLEXERIL) 5 MG tablet Take 5 mg by mouth 2 (two) times daily as needed for muscle spasms. 05/21/22   [provider]  Darbepoetin Alfa (ARANESP) 200 MCG/0.4ML SOSY injection Inject 0.4 mLs (200 mcg total) into the skin every Monday at 6 PM. 07/06/22   Rick Duff, MD  EQ ASPIRIN ADULT LOW DOSE 81 MG tablet TAKE 1 TABLET BY MOUTH ONCE DAILY. SWALLOW WHOLE. 02/06/22   Waynetta Sandy, MD  glucose blood test strip Use as instructed 05/02/21   Dorethea Clan,  DO  insulin glargine (LANTUS SOLOSTAR) 100 UNIT/ML Solostar Pen Inject 15 Units into the skin at bedtime. 06/30/22   Rick Duff, MD  Insulin Pen Needle (PENTIPS) 32G X 4 MM MISC Use as directed 02/14/21   Meredith Staggers, MD  liraglutide (VICTOZA) 18 MG/3ML SOPN Inject 0.6 mg into the skin daily. 06/30/22 06/25/23   Rick Duff, MD  [facility] 5 g packet Take by mouth. 07/30/22   [provider]  multivitamin (RENA-VIT) TABS tablet Take 1 tablet by mouth daily. Patient taking differently: Take 1 tablet by mouth See admin instructions. On Tuesday, Wednesday and Thursday 02/14/21   Angiulli, Lavon Paganini, PA-C  polyethylene glycol (MIRALAX / GLYCOLAX) 17 g packet Take 17 g by mouth daily. 07/01/22   Rick Duff, MD      Allergies    Patient has no known allergies.    Review of Systems   Review of Systems  Neurological:  Positive for weakness and headaches.  All other systems reviewed and are negative.   Physical Exam Updated Vital Signs BP (!) 154/67 (BP Location: Right Arm)   Pulse 91   Temp 98.2 F (36.8 C) (Oral)   Resp 16   SpO2 90%  Physical Exam Vitals and nursing note reviewed.  Constitutional:      Comments: Patient continues falling asleep during exam, easily arousable. Disheveled appearance  HENT:     Head: Normocephalic and atraumatic.  Eyes:     Conjunctiva/sclera: Conjunctivae normal.  Cardiovascular:     Rate and Rhythm: Normal rate and regular rhythm.  Pulmonary:     Effort: Pulmonary effort is normal. No respiratory distress.     Breath sounds: Normal breath sounds.  Abdominal:     General: There is no distension.     Palpations: Abdomen is soft.     Tenderness: There is no abdominal tenderness.  Skin:    General: Skin is warm and dry.  Neurological:     General: No focal deficit present.     Mental Status: She is easily aroused.     Comments: Neuro: Speech is clear, able to follow commands. CN III-XII intact grossly intact. PERRLA. EOMI. Sensation intact throughout. Str 5/5 all extremities.  Psychiatric:        Behavior: Behavior is cooperative.     ED Results / Procedures / Treatments   Labs (all labs ordered are listed, but only abnormal results are displayed) Labs Reviewed  CBC - Abnormal; Notable for the following components:      Result  Value   RBC 2.59 (*)    Hemoglobin 8.0 (*)    HCT 25.5 (*)    RDW 16.8 (*)    nRBC 1.5 (*)    All other components within normal limits  COMPREHENSIVE METABOLIC PANEL - Abnormal; Notable for the following components:   Chloride 94 (*)    Glucose, Bld 202 (*)    BUN 23 (*)    Creatinine, Ser 4.21 (*)    Total Protein 8.4 (*)    Albumin 3.3 (*)    GFR, Estimated 13 (*)    Anion gap 19 (*)    All other components within normal limits  RESP PANEL BY RT-PCR (RSV, FLU A&B, COVID)  RVPGX2  URINALYSIS, ROUTINE W REFLEX MICROSCOPIC  RAPID URINE DRUG SCREEN, HOSP PERFORMED    EKG EKG Interpretation  Date/Time:  Tuesday September 15 2022 13:12:14 EST Ventricular Rate:  91 PR Interval:  155 QRS Duration: 93 QT Interval:  376 QTC Calculation:  463 R Axis:   50 Text Interpretation: Sinus rhythm Probable left atrial enlargement ST elev, probable normal early repol pattern No significant change was found Confirmed by Ezequiel Essex [phone]) on 09/15/2022 1:31:40 PM  Radiology No results found.  Procedures Procedures    Medications Ordered in ED Medications - No data to display  ED Course/ Medical Decision Making/ A&P                             Medical Decision Making Amount and/or Complexity of Data Reviewed Labs: ordered.  This patient is a 47 y.o. female  who presents to the ED for concern of generalized weakness and headache since this morning. Coming from finished dialysis treatment.    Differential diagnoses prior to evaluation: The emergent differential diagnosis includes, but is not limited to,  CVA, spinal cord injury, ACS, arrhythmia, syncope, orthostatic hypotension, sepsis, hypoglycemia, hypoxia, electrolyte disturbance, endocrine disorder, anemia, environmental exposure, polypharmacy . This is not an exhaustive differential.   Past Medical History / Co-morbidities: diabetic gastroparesis, erosive esophagitis, GERD, depression, HIV, s/p bilateral above-the-knee  amputations, diabetic retinopathy, ESRD on dialysis T/R/S, CAD, PAD, hypertension, CVA, hyperlipidemia, homelessness  Additional history: Chart reviewed. Pertinent results include: Follows with ID for HIV, most recent visit 1/17 with non-detectable levels, CD4 count <400.  Physical Exam: Physical exam performed. The pertinent findings include: Hypertensive, but otherwise normal vital signs.  Very tired on exam, but easily arousable.  Normal neurologic exam as above. Lung sounds clear.   Lab Tests/Imaging studies: I personally interpreted labs/imaging and the pertinent results include: Hemoglobin of 8, appears to be at baseline but slightly improved compared to prior.  CMP grossly at baseline.  UDS, urinalysis, respiratory panel, chest x-ray, and CT head pending at time of shift change.  Cardiac monitoring: EKG obtained and interpreted by my attending physician which shows: sinus rhythm, nonspecific ST change, no significant change compared to prior   Disposition: Patient discussed and care transferred to [facility]  at shift change. Please see his/her note for further details regarding further ED course and disposition. Plan at time of handoff is follow up on further labs and imaging. Basic laboratory evaluation unremarkable but in the setting of patient's known HIV, headache, an feeling warm on exam, will obtain rectal temperature and broaden infectious workup.  Anticipate if infectious source is not found, patient may need a lumbar puncture.  I discussed this case with my attending physician Dr. Wyvonnia Dusky who cosigned this note including patient's presenting symptoms, physical exam, and planned diagnostics and interventions. Attending physician stated agreement with plan or made changes to plan which were implemented.   Final Clinical Impression(s) / ED Diagnoses Final diagnoses:  Generalized weakness    Rx / DC Orders ED Discharge Orders     None      Portions of this report may  have been transcribed using voice recognition software. Every effort was made to ensure accuracy; however, inadvertent computerized transcription errors may be present.    Estill Cotta 09/15/22 1548    Ezequiel Essex, MD 09/16/22 [phone]

## 2022-09-15 NOTE — ED Notes (Signed)
Got patient on the monitor did EKG shown to Dr Wyvonnia Dusky patient is resting with call bell, in reach got patient a blanket

## 2022-09-15 NOTE — Discharge Instructions (Addendum)
Please follow-up with your infectious disease team the next few days.  Please take your blood pressure medications as prescribed as your blood pressure is slightly elevated from being in the ER and being unable to take her medications here.  Your blood pressure here was not lowered to the risk of causing a stroke so please take your blood pressure medications as prescribed.  If symptoms worsen please return to ER.

## 2022-09-15 NOTE — ED Provider Notes (Signed)
[place] Provider Note   CSN: [id number] Arrival date & time: 09/15/22  1232     History  Chief Complaint  Patient presents with   Weakness    Kathy Frank is a 47 y.o. female history of diabetic gastroparesis, erosive esophagitis, GERD, depression, HIV, status post bilateral above-the-knee amputations, diabetic retinopathy, ESRD on dialysis Tuesday Thursday Saturday, CAD, PAD, hypertension, CVA, hyperlipidemia who presented today for weakness.  Patient states that she had her dialysis appointment today and was able to finish and has been compliant.  Patient states she woke up feeling weaker than normal.  Patient states she took 200 mg of her gabapentin last night which is double her normal dose.  Patient denied any other drug use.  Patient noted last week she had black stools but this is since resolved.  Patient states she has a headache similar to her previous migraines that is on the right side of her head.  Patient denied headache being sudden or maximal in onset.  Patient states she takes [facility] powder for headaches and that she cannot take Compazine due to prolonged QT and cannot have triptans due to prior history of CVA.  Patient denied chest pain, shortness of breath, abdominal pain, dysuria, hematuria, nausea/vomiting, dysphagia, neck stiffness/pain, recent illnesses  Home Medications Prior to Admission medications   Medication Sig Start Date End Date Taking? Authorizing Provider  Accu-Chek Softclix Lancets lancets Use as instructed 05/02/21   Atway, Rayann N, DO  albuterol (VENTOLIN HFA) 108 (90 Base) MCG/ACT inhaler Inhale 2 puffs into the lungs every 4 (four) hours as needed for wheezing or shortness of breath. 02/14/21   Angiulli, Lavon Paganini, PA-C  amLODipine (NORVASC) 10 MG tablet Take 1 tablet (10 mg total) by mouth daily. Patient not taking: Reported on 08/26/2022 05/15/21   [name], MD  amLODipine-olmesartan (AZOR) 10-40 MG  tablet Take by mouth. 07/16/22   [provider]  amoxicillin-clavulanate (AUGMENTIN) 500-125 MG tablet Take 1 tablet by mouth at bedtime. Patient not taking: Reported on 08/26/2022 06/26/22   Virl Axe, MD  atorvastatin (LIPITOR) 80 MG tablet Take 1 tablet (80 mg total) by mouth daily. Patient not taking: Reported on 06/28/2022 05/24/21   Masters, Joellen Jersey, DO  bictegravir-emtricitabine-tenofovir AF (BIKTARVY) 50-200-25 MG TABS tablet Take 1 tablet by mouth daily. OVERDUE FOR OFFICE FOLLOW UP - PLEASE CALL OUR OFFICE TO SCHEDULE [phone] 08/26/22   Michel Bickers, MD  calcitRIOL (ROCALTROL) 0.25 MCG capsule Take 5 capsules (1.25 mcg total) by mouth Every Tuesday,Thursday,and Saturday with dialysis. 07/02/22   Rick Duff, MD  calcium acetate (PHOSLO) 667 MG capsule Take 2 capsules (1,334 mg total) by mouth 3 (three) times daily with meals. 06/30/22   Rick Duff, MD  Continuous Blood Gluc Receiver (DEXCOM G7 RECEIVER) DEVI Use to check blood sugars cntinuously 06/30/22   Rick Duff, MD  Continuous Blood Gluc Sensor (DEXCOM G7 SENSOR) MISC Use to check blood sugar continuously 06/30/22   Rick Duff, MD  cyclobenzaprine (FLEXERIL) 5 MG tablet Take 5 mg by mouth 2 (two) times daily as needed for muscle spasms. 05/21/22   [provider]  Darbepoetin Alfa (ARANESP) 200 MCG/0.4ML SOSY injection Inject 0.4 mLs (200 mcg total) into the skin every Monday at 6 PM. 07/06/22   Rick Duff, MD  EQ ASPIRIN ADULT LOW DOSE 81 MG tablet TAKE 1 TABLET BY MOUTH ONCE DAILY. SWALLOW WHOLE. 02/06/22   Waynetta Sandy, MD  glucose blood test strip Use as  instructed 05/02/21   Atway, Rayann N, DO  insulin glargine (LANTUS SOLOSTAR) 100 UNIT/ML Solostar Pen Inject 15 Units into the skin at bedtime. 06/30/22   Rick Duff, MD  Insulin Pen Needle (PENTIPS) 32G X 4 MM MISC Use as directed 02/14/21   Meredith Staggers, MD  liraglutide (VICTOZA) 18 MG/3ML SOPN  Inject 0.6 mg into the skin daily. 06/30/22 06/25/23  Rick Duff, MD  [facility] 5 g packet Take by mouth. 07/30/22   [provider]  multivitamin (RENA-VIT) TABS tablet Take 1 tablet by mouth daily. Patient taking differently: Take 1 tablet by mouth See admin instructions. On Tuesday, Wednesday and Thursday 02/14/21   Angiulli, Lavon Paganini, PA-C  polyethylene glycol (MIRALAX / GLYCOLAX) 17 g packet Take 17 g by mouth daily. 07/01/22   Rick Duff, MD      Allergies    Patient has no known allergies.    Review of Systems   Review of Systems  Neurological:  Positive for weakness.  See HPI  Physical Exam Updated Vital Signs BP (!) 192/92   Pulse 91   Temp 98.7 F (37.1 C) (Oral)   Resp 17   SpO2 93%  Physical Exam Exam conducted with a chaperone present.  Constitutional:      Comments: Fatigue Not able to hold eye contact to keep eyes open  HENT:     Head: Normocephalic.  Eyes:     Extraocular Movements: Extraocular movements intact.     Pupils: Pupils are equal, round, and reactive to light.  Cardiovascular:     Rate and Rhythm: Regular rhythm. Tachycardia present.     Pulses: Normal pulses.  Pulmonary:     Effort: Pulmonary effort is normal.     Breath sounds: Normal breath sounds.  Abdominal:     Palpations: Abdomen is soft.     Tenderness: There is no abdominal tenderness. There is no guarding or rebound.  Genitourinary:    Comments: [name], RN as chaperone Patient gave verbal consent No blood noted on DRE however there appeared to be some blood on underwear which patient stated is normal Musculoskeletal:     Cervical back: Normal range of motion and neck supple. No rigidity.     Comments: Bilateral above-the-knee amputations Full active range of motion in upper limbs 4 out of 5 bilateral grip strength  Skin:    General: Skin is warm and dry.     Capillary Refill: Capillary refill takes less than 2 seconds.  Neurological:     Comments:  Slow speech Sensation intact in bilateral hands  Psychiatric:     Comments: Slow thought content     ED Results / Procedures / Treatments   Labs (all labs ordered are listed, but only abnormal results are displayed) Labs Reviewed  CBC - Abnormal; Notable for the following components:      Result Value   RBC 2.59 (*)    Hemoglobin 8.0 (*)    HCT 25.5 (*)    RDW 16.8 (*)    nRBC 1.5 (*)    All other components within normal limits  COMPREHENSIVE METABOLIC PANEL - Abnormal; Notable for the following components:   Chloride 94 (*)    Glucose, Bld 202 (*)    BUN 23 (*)    Creatinine, Ser 4.21 (*)    Total Protein 8.4 (*)    Albumin 3.3 (*)    GFR, Estimated 13 (*)    Anion gap 19 (*)    All other components  within normal limits  RESP PANEL BY RT-PCR (RSV, FLU A&B, COVID)  RVPGX2  URINALYSIS, ROUTINE W REFLEX MICROSCOPIC  RAPID URINE DRUG SCREEN, HOSP PERFORMED  POC OCCULT BLOOD, ED    EKG EKG Interpretation  Date/Time:  Tuesday September 15 2022 13:12:14 EST Ventricular Rate:  91 PR Interval:  155 QRS Duration: 93 QT Interval:  376 QTC Calculation: 463 R Axis:   50 Text Interpretation: Sinus rhythm Probable left atrial enlargement ST elev, probable normal early repol pattern No significant change was found Confirmed by Ezequiel Essex [phone]) on 09/15/2022 1:31:40 PM  Radiology CT Head Wo Contrast  Result Date: 09/15/2022 CLINICAL DATA:  Initial evaluation for headache. EXAM: CT HEAD WITHOUT CONTRAST TECHNIQUE: Contiguous axial images were obtained from the base of the skull through the vertex without intravenous contrast. RADIATION DOSE REDUCTION: This exam was performed according to the departmental dose-optimization program which includes automated exposure control, adjustment of the mA and/or kV according to patient size and/or use of iterative reconstruction technique. COMPARISON:  Comparison made with prior CT from 03/09/2022. FINDINGS: Brain: Cerebral volume within  normal limits. Patchy hypodensity involving the right basal ganglia and periatrial white matter, consistent with prior ischemic infarcts. There is an additional 1 cm hypodensity involving the posterior right frontal corona radiata, consistent with a prior lacunar infarct. This is new from prior, but favored to be chronic in nature. No acute intracranial hemorrhage. No visible acute large vessel territory infarct. No mass lesion or midline shift. No hydrocephalus or extra-axial fluid collection. Cavum et septum pellucidum noted. Vascular: No abnormal hyperdense vessel. Age advanced calcified atherosclerosis about the skull base. Skull: Scalp soft tissues and calvarium within normal limits. Sinuses/Orbits: Globes and orbital soft tissues within normal limits. Mild scattered mucoperiosteal thickening present about the sphenoid ethmoidal and maxillary sinuses, greater on the left. Mastoid air cells are clear. Other: None. IMPRESSION: 1. No acute intracranial abnormality. 2. Multiple prior ischemic infarcts involving the right basal ganglia and periatrial white matter, stable. Additional 1 cm hypodensity involving the posterior right frontal corona radiata, consistent with a prior lacunar infarct, new from prior, but favored to be chronic in nature. 3. Age advanced calcified atherosclerosis about the skull base. Electronically Signed   By: Jeannine Boga M.D.   On: 09/15/2022 20:51   DG Chest 2 View  Result Date: 09/15/2022 CLINICAL DATA:  Fatigue, cough, and weakness. EXAM: CHEST - 2 VIEW COMPARISON:  Chest x-ray dated June 25, 2022. FINDINGS: Unchanged mild cardiomegaly. Normal pulmonary vascularity. No focal consolidation, pleural effusion, or pneumothorax. Insert IMPRESSION: No active cardiopulmonary disease. Electronically Signed   By: Titus Dubin M.D.   On: 09/15/2022 16:59    Procedures Procedures    Medications Ordered in ED Medications  diphenhydrAMINE (BENADRYL) injection 25 mg (25 mg  Intravenous Given 09/15/22 1947)  metoCLOPramide (REGLAN) injection 10 mg (10 mg Intravenous Given 09/15/22 1946)    ED Course/ Medical Decision Making/ A&P                             Medical Decision Making Amount and/or Complexity of Data Reviewed Labs: ordered. Radiology: ordered.   [name] 47 y.o. presented today for weakness. Working DDx that I considered at this time includes, but not limited to, GI bleed, meningitis, polypharmacy, anemia, electrolyte disturbance, sepsis, hypoxia.  Review of prior external notes: 09/15/22 ED provider, 06/30/22 discharge summary  Unique Tests and My Interpretation:  CBC: Hemoglobin 8 which is  improved from 2 months ago CMP: Glucose increased to 202 from previous CMP; the rest of the results are baseline Urine drug screen: Respiratory panel: Negative Urinalysis: Fecal occult blood: negative EKG: Regular, 91 bpm, no signs of ischemia with possible early repolarization, no blocks noted Chest x-ray: no acute changes CT head without contrast: No acute changes however signs of chronic infarcts and a chronic lacunar infarct that was not seen on previous CT scans  Discussion with Independent Historian: None  Discussion of Management of Tests: None  Risk: Low:  - based on diagnostic testing/clinical impression and treatment plan  Risk Stratification Score: None  Staffed with Vanita Panda, MD  R/o DDx: GI bleed: Negative fecal occult test Electrolyte abnormality: Electrolytes are at baseline Hypoxia: At rest patient's O2 is 98% and patient denied shortness of breath Anemia: Hemoglobin is at baseline Meningitis: No neck stiffness, negative presents, No Fever Sepsis: Patient does not meet sepsis criteria  Plan: Patient was given at signout.  Patient presented today for weakness that began this morning.  On exam patient appeared fatigued and cannot keep her eyes open but was able to follow commands and was alert and oriented x 3 to person  place and year.  Labs ordered along with chest x-ray and CT of the head to assess for any abnormalities.  Fecal occult test was ordered to evaluate for any GI bleed as patient states she had melena last week.  If we cannot find a source to explain this new weakness then an LP will be considered.  Patient was given IV Benadryl and IV Reglan for migraine management.  Patient blood pressures increased to 192/92 however I suspect this is because patient has been unable to take her blood pressure medications during her stay in the ED as opposed to any acute causes. On recheck, patient stated migraine medicine was helping with her headache.  CT was negative for any acute changes.  At this time patient is recently stable for discharge and to be followed up outpatient with her infectious disease team.  Blood pressure was elevated at 192/92 today however that was due to patient being unable to take her blood pressure.  I spoke with the patient about continuing her blood pressure as prescribed.        Final Clinical Impression(s) / ED Diagnoses Final diagnoses:  Generalized weakness  Migraine without status migrainosus, not intractable, unspecified migraine type    Rx / DC Orders ED Discharge Orders     None         Elvina Sidle 09/15/22 2119    Carmin Muskrat, MD 09/15/22 2344

## 2022-09-15 NOTE — ED Triage Notes (Signed)
Pt arrives VIA EMS from dialysis (completed full tx) with C/O generalized weakness. VSS Bilat BKA.

## 2022-09-16 ENCOUNTER — Telehealth: Payer: Self-pay

## 2022-09-16 NOTE — Telephone Encounter (Signed)
Patient called stating that she was seen at ED yesterday and was told to follow up with her ID doctor. Dr.Campbell could you please review ED notes and see if patient needs to follow up? Just recently seen you on 1/17.     [place], CMA

## 2022-09-16 NOTE — Telephone Encounter (Signed)
Patient aware and verbalized her understanding.   Doretta Remmert P Wenceslaus Gist, CMA  

## 2022-09-17 DIAGNOSIS — R197 Diarrhea, unspecified: Secondary | ICD-10-CM | POA: Diagnosis not present

## 2022-09-17 DIAGNOSIS — N186 End stage renal disease: Secondary | ICD-10-CM | POA: Diagnosis not present

## 2022-09-17 DIAGNOSIS — D631 Anemia in chronic kidney disease: Secondary | ICD-10-CM | POA: Diagnosis not present

## 2022-09-17 DIAGNOSIS — R519 Headache, unspecified: Secondary | ICD-10-CM | POA: Diagnosis not present

## 2022-09-17 DIAGNOSIS — Z992 Dependence on renal dialysis: Secondary | ICD-10-CM | POA: Diagnosis not present

## 2022-09-17 DIAGNOSIS — N2581 Secondary hyperparathyroidism of renal origin: Secondary | ICD-10-CM | POA: Diagnosis not present

## 2022-09-19 DIAGNOSIS — R197 Diarrhea, unspecified: Secondary | ICD-10-CM | POA: Diagnosis not present

## 2022-09-19 DIAGNOSIS — R519 Headache, unspecified: Secondary | ICD-10-CM | POA: Diagnosis not present

## 2022-09-19 DIAGNOSIS — N186 End stage renal disease: Secondary | ICD-10-CM | POA: Diagnosis not present

## 2022-09-19 DIAGNOSIS — N2581 Secondary hyperparathyroidism of renal origin: Secondary | ICD-10-CM | POA: Diagnosis not present

## 2022-09-19 DIAGNOSIS — Z992 Dependence on renal dialysis: Secondary | ICD-10-CM | POA: Diagnosis not present

## 2022-09-19 DIAGNOSIS — D631 Anemia in chronic kidney disease: Secondary | ICD-10-CM | POA: Diagnosis not present

## 2022-09-22 ENCOUNTER — Other Ambulatory Visit: Payer: Self-pay

## 2022-09-22 ENCOUNTER — Ambulatory Visit: Payer: 59

## 2022-09-22 DIAGNOSIS — D631 Anemia in chronic kidney disease: Secondary | ICD-10-CM | POA: Diagnosis not present

## 2022-09-22 DIAGNOSIS — Z992 Dependence on renal dialysis: Secondary | ICD-10-CM | POA: Diagnosis not present

## 2022-09-22 DIAGNOSIS — N2581 Secondary hyperparathyroidism of renal origin: Secondary | ICD-10-CM | POA: Diagnosis not present

## 2022-09-22 DIAGNOSIS — N186 End stage renal disease: Secondary | ICD-10-CM | POA: Diagnosis not present

## 2022-09-22 DIAGNOSIS — R519 Headache, unspecified: Secondary | ICD-10-CM | POA: Diagnosis not present

## 2022-09-22 DIAGNOSIS — R197 Diarrhea, unspecified: Secondary | ICD-10-CM | POA: Diagnosis not present

## 2022-09-24 DIAGNOSIS — Z992 Dependence on renal dialysis: Secondary | ICD-10-CM | POA: Diagnosis not present

## 2022-09-24 DIAGNOSIS — R519 Headache, unspecified: Secondary | ICD-10-CM | POA: Diagnosis not present

## 2022-09-24 DIAGNOSIS — N2581 Secondary hyperparathyroidism of renal origin: Secondary | ICD-10-CM | POA: Diagnosis not present

## 2022-09-24 DIAGNOSIS — R197 Diarrhea, unspecified: Secondary | ICD-10-CM | POA: Diagnosis not present

## 2022-09-24 DIAGNOSIS — D631 Anemia in chronic kidney disease: Secondary | ICD-10-CM | POA: Diagnosis not present

## 2022-09-24 DIAGNOSIS — N186 End stage renal disease: Secondary | ICD-10-CM | POA: Diagnosis not present

## 2022-09-25 ENCOUNTER — Telehealth: Payer: Self-pay

## 2022-09-25 NOTE — Telephone Encounter (Signed)
     Patient  visit on 2/6  at [facility]   Have you been able to follow up with your primary care physician? Yes   The patient was or was not able to obtain any needed medicine or equipment. Yes   Are there diet recommendations that you are having difficulty following? Na   Patient expresses understanding of discharge instructions and education provided has no other needs at this time.  Yes     [place] [phone] [address], [place], [place] [address] Phone: [phone] Email: [email or web address]

## 2022-09-26 DIAGNOSIS — Z992 Dependence on renal dialysis: Secondary | ICD-10-CM | POA: Diagnosis not present

## 2022-09-26 DIAGNOSIS — N2581 Secondary hyperparathyroidism of renal origin: Secondary | ICD-10-CM | POA: Diagnosis not present

## 2022-09-26 DIAGNOSIS — N186 End stage renal disease: Secondary | ICD-10-CM | POA: Diagnosis not present

## 2022-09-26 DIAGNOSIS — D631 Anemia in chronic kidney disease: Secondary | ICD-10-CM | POA: Diagnosis not present

## 2022-09-26 DIAGNOSIS — R519 Headache, unspecified: Secondary | ICD-10-CM | POA: Diagnosis not present

## 2022-09-26 DIAGNOSIS — R197 Diarrhea, unspecified: Secondary | ICD-10-CM | POA: Diagnosis not present

## 2022-09-27 ENCOUNTER — Emergency Department (HOSPITAL_COMMUNITY)
Admission: EM | Admit: 2022-09-27 | Discharge: 2022-09-28 | Disposition: A | Discharge status: Home / Self Care | Payer: 59 | Source: Home / Self Care | Attending: Emergency Medicine | Admitting: Emergency Medicine

## 2022-09-27 ENCOUNTER — Encounter (HOSPITAL_COMMUNITY): Payer: Self-pay | Admitting: Emergency Medicine

## 2022-09-27 ENCOUNTER — Emergency Department (HOSPITAL_COMMUNITY): Payer: 59

## 2022-09-27 DIAGNOSIS — E785 Hyperlipidemia, unspecified: Secondary | ICD-10-CM | POA: Diagnosis not present

## 2022-09-27 DIAGNOSIS — R519 Headache, unspecified: Secondary | ICD-10-CM | POA: Diagnosis not present

## 2022-09-27 DIAGNOSIS — F32A Depression, unspecified: Secondary | ICD-10-CM | POA: Diagnosis not present

## 2022-09-27 DIAGNOSIS — I120 Hypertensive chronic kidney disease with stage 5 chronic kidney disease or end stage renal disease: Secondary | ICD-10-CM | POA: Diagnosis not present

## 2022-09-27 DIAGNOSIS — I2510 Atherosclerotic heart disease of native coronary artery without angina pectoris: Secondary | ICD-10-CM | POA: Insufficient documentation

## 2022-09-27 DIAGNOSIS — E113393 Type 2 diabetes mellitus with moderate nonproliferative diabetic retinopathy without macular edema, bilateral: Secondary | ICD-10-CM | POA: Diagnosis not present

## 2022-09-27 DIAGNOSIS — E1122 Type 2 diabetes mellitus with diabetic chronic kidney disease: Secondary | ICD-10-CM | POA: Insufficient documentation

## 2022-09-27 DIAGNOSIS — R0789 Other chest pain: Secondary | ICD-10-CM

## 2022-09-27 DIAGNOSIS — N186 End stage renal disease: Secondary | ICD-10-CM | POA: Insufficient documentation

## 2022-09-27 DIAGNOSIS — D631 Anemia in chronic kidney disease: Secondary | ICD-10-CM | POA: Diagnosis not present

## 2022-09-27 DIAGNOSIS — I272 Pulmonary hypertension, unspecified: Secondary | ICD-10-CM | POA: Diagnosis not present

## 2022-09-27 DIAGNOSIS — Z79899 Other long term (current) drug therapy: Secondary | ICD-10-CM | POA: Insufficient documentation

## 2022-09-27 DIAGNOSIS — M160 Bilateral primary osteoarthritis of hip: Secondary | ICD-10-CM | POA: Diagnosis not present

## 2022-09-27 DIAGNOSIS — I4901 Ventricular fibrillation: Secondary | ICD-10-CM | POA: Diagnosis not present

## 2022-09-27 DIAGNOSIS — Z7982 Long term (current) use of aspirin: Secondary | ICD-10-CM | POA: Insufficient documentation

## 2022-09-27 DIAGNOSIS — I442 Atrioventricular block, complete: Secondary | ICD-10-CM | POA: Diagnosis not present

## 2022-09-27 DIAGNOSIS — M898X9 Other specified disorders of bone, unspecified site: Secondary | ICD-10-CM | POA: Diagnosis not present

## 2022-09-27 DIAGNOSIS — K219 Gastro-esophageal reflux disease without esophagitis: Secondary | ICD-10-CM | POA: Diagnosis not present

## 2022-09-27 DIAGNOSIS — Z1152 Encounter for screening for COVID-19: Secondary | ICD-10-CM | POA: Diagnosis not present

## 2022-09-27 DIAGNOSIS — R079 Chest pain, unspecified: Secondary | ICD-10-CM | POA: Diagnosis not present

## 2022-09-27 DIAGNOSIS — Z21 Asymptomatic human immunodeficiency virus [HIV] infection status: Secondary | ICD-10-CM | POA: Insufficient documentation

## 2022-09-27 DIAGNOSIS — J189 Pneumonia, unspecified organism: Secondary | ICD-10-CM | POA: Diagnosis not present

## 2022-09-27 DIAGNOSIS — E1151 Type 2 diabetes mellitus with diabetic peripheral angiopathy without gangrene: Secondary | ICD-10-CM | POA: Diagnosis not present

## 2022-09-27 DIAGNOSIS — Z87891 Personal history of nicotine dependence: Secondary | ICD-10-CM | POA: Insufficient documentation

## 2022-09-27 DIAGNOSIS — R0602 Shortness of breath: Secondary | ICD-10-CM | POA: Diagnosis not present

## 2022-09-27 DIAGNOSIS — G43809 Other migraine, not intractable, without status migrainosus: Secondary | ICD-10-CM | POA: Diagnosis not present

## 2022-09-27 DIAGNOSIS — I10 Essential (primary) hypertension: Secondary | ICD-10-CM | POA: Diagnosis not present

## 2022-09-27 DIAGNOSIS — Z89612 Acquired absence of left leg above knee: Secondary | ICD-10-CM | POA: Diagnosis not present

## 2022-09-27 DIAGNOSIS — Z992 Dependence on renal dialysis: Secondary | ICD-10-CM | POA: Insufficient documentation

## 2022-09-27 DIAGNOSIS — Z89511 Acquired absence of right leg below knee: Secondary | ICD-10-CM | POA: Diagnosis not present

## 2022-09-27 DIAGNOSIS — I700 Atherosclerosis of aorta: Secondary | ICD-10-CM | POA: Diagnosis not present

## 2022-09-27 DIAGNOSIS — E1143 Type 2 diabetes mellitus with diabetic autonomic (poly)neuropathy: Secondary | ICD-10-CM | POA: Diagnosis not present

## 2022-09-27 DIAGNOSIS — Z794 Long term (current) use of insulin: Secondary | ICD-10-CM | POA: Diagnosis not present

## 2022-09-27 DIAGNOSIS — N2581 Secondary hyperparathyroidism of renal origin: Secondary | ICD-10-CM | POA: Diagnosis not present

## 2022-09-27 DIAGNOSIS — R197 Diarrhea, unspecified: Secondary | ICD-10-CM | POA: Diagnosis not present

## 2022-09-27 LAB — CBC
HCT: 23 % — ABNORMAL LOW (ref 36.0–46.0)
Hemoglobin: 7.3 g/dL — ABNORMAL LOW (ref 12.0–15.0)
MCH: 31.5 pg (ref 26.0–34.0)
MCHC: 31.7 g/dL (ref 30.0–36.0)
MCV: 99.1 fL (ref 80.0–100.0)
Platelets: 301 10*3/uL (ref 150–400)
RBC: 2.32 MIL/uL — ABNORMAL LOW (ref 3.87–5.11)
RDW: 16.5 % — ABNORMAL HIGH (ref 11.5–15.5)
WBC: 6.7 10*3/uL (ref 4.0–10.5)
nRBC: 0.6 % — ABNORMAL HIGH (ref 0.0–0.2)

## 2022-09-27 LAB — COMPREHENSIVE METABOLIC PANEL
ALT: 10 U/L (ref 0–44)
AST: 14 U/L — ABNORMAL LOW (ref 15–41)
Albumin: 3 g/dL — ABNORMAL LOW (ref 3.5–5.0)
Alkaline Phosphatase: 47 U/L (ref 38–126)
Anion gap: 16 — ABNORMAL HIGH (ref 5–15)
BUN: 57 mg/dL — ABNORMAL HIGH (ref 6–20)
CO2: 25 mmol/L (ref 22–32)
Calcium: 9 mg/dL (ref 8.9–10.3)
Chloride: 97 mmol/L — ABNORMAL LOW (ref 98–111)
Creatinine, Ser: 7.01 mg/dL — ABNORMAL HIGH (ref 0.44–1.00)
GFR, Estimated: 7 mL/min — ABNORMAL LOW (ref >60)
Glucose, Bld: 179 mg/dL — ABNORMAL HIGH (ref 70–99)
Potassium: 4.5 mmol/L (ref 3.5–5.1)
Sodium: 138 mmol/L (ref 135–145)
Total Bilirubin: 0.1 mg/dL — ABNORMAL LOW (ref 0.3–1.2)
Total Protein: 7.7 g/dL (ref 6.5–8.1)

## 2022-09-27 LAB — TROPONIN I (HIGH SENSITIVITY)
Troponin I (High Sensitivity): 54 ng/L — ABNORMAL HIGH (ref <18)
Troponin I (High Sensitivity): 49 ng/L — ABNORMAL HIGH (ref <18)

## 2022-09-27 MED ORDER — HYDRALAZINE HCL 25 MG PO TABS
50.0000 mg | ORAL_TABLET | Freq: Once | ORAL | Status: AC
  Administered 2022-09-27: 50 mg via ORAL
  Filled 2022-09-27: qty 2

## 2022-09-27 MED ORDER — ACETAMINOPHEN 500 MG PO TABS
1000.0000 mg | ORAL_TABLET | Freq: Once | ORAL | Status: AC
  Administered 2022-09-27: 1000 mg via ORAL
  Filled 2022-09-27: qty 2

## 2022-09-27 MED ORDER — PROCHLORPERAZINE EDISYLATE 10 MG/2ML IJ SOLN
10.0000 mg | Freq: Once | INTRAMUSCULAR | Status: AC
  Administered 2022-09-27: 10 mg via INTRAVENOUS
  Filled 2022-09-27: qty 2

## 2022-09-27 MED ORDER — DIPHENHYDRAMINE HCL 50 MG/ML IJ SOLN
12.5000 mg | Freq: Once | INTRAMUSCULAR | Status: AC
  Administered 2022-09-27: 12.5 mg via INTRAVENOUS
  Filled 2022-09-27: qty 1

## 2022-09-27 NOTE — ED Notes (Addendum)
Pt requesting medication for headache. MD messaged.

## 2022-09-27 NOTE — ED Notes (Signed)
Pt assisted to bedside commode. Normal stool, moderate amount, no urine. Pt assisted back into bed and cardiac monitoring resumed.

## 2022-09-27 NOTE — ED Triage Notes (Signed)
Pt here from home with c/o abnormal labs , pt states that she was told earlier this week that her hgb was low ( chronically low ) , she had a nose bleed today and wants to have it checked again , also c/o cough and some slight sob , has not missed dialysis

## 2022-09-27 NOTE — ED Provider Notes (Signed)
[facility] Provider Note  CSN: [id number] Arrival date & time: 09/27/22 1728  Chief Complaint(s) Abnormal Lab and Shortness of Breath  HPI Kathy Frank is a 47 y.o. female with history of diabetes, coronary artery disease, end-stage renal disease on Tuesday Thursday Saturday dialysis, HIV, coronary artery disease presenting to the emergency department with nosebleed.  Patient reports that she was told at her dialysis center that she had a low hemoglobin of 6 point something.  She reports today she had a nosebleed and she was worried that it had dropped further.  The nosebleed has stopped.  Patient also reports that today she had a headache, consistent with prior migraines.  She reports mild nausea, photophobia.  No head trauma.  She also reports that she had chest pain today, reports that it was in the middle of her chest, did not radiate, with mild shortness of breath.  The pain is resolved.  She reports it was not exertional, not pleuritic.  She did not have any diaphoresis, nausea or vomiting with the chest pain.  She reports it did not feel similar to prior MI   Past Medical History Past Medical History:  Diagnosis Date   Anal abscess    chronic   Anxiety    CAD (coronary artery disease)    CVA (cerebral vascular accident) ([employer])    Depression 06/28/2006   Qualifier: Diagnosis of  By: Riccardo Dubin MD, Todd     Diabetes mellitus type 2 in obese ([place]) 06/28/1994   Dyspnea    uses oxygeb 2 liters per minute at dialysis   End stage renal disease on dialysis [facility])    on hemodialysis T/Th/Sat   Erosive esophagitis    Esophageal reflux    Eye redness    Gastroparesis    ? diabetic   GERD (gastroesophageal reflux disease)    History of blood transfusion 2019   Human immunodeficiency virus (HIV) disease ([place]) 04/23/2016   Hyperlipidemia    Hypertension    Metabolic bone disease [id number]   Moderate nonproliferative diabetic retinopathy  of both eyes ([place]) 11/21/2014   11/14/14: Noted on retinal imaging; needs follow-up imaging in 6 months  05/22/16: Noted on retinal imaging again; needs follow-up imaging in 6 months   PAD (peripheral artery disease) ([place])    Type 2 diabetes mellitus with diabetic peripheral angiopathy without gangrene ([place]) 05/01/2019   Wears dentures    lower   Wound infection s/p L transmetatarsal amputation    Patient Active Problem List   Diagnosis Date Noted   Homelessness 08/26/2022   Migraine with aura 06/29/2022   Community acquired pneumonia 06/25/2022   Hypertensive emergency 11/09/2021   Requires assistance with activities of daily living (ADL) 10/27/2021   Analgesic overuse headache 06/28/2021   Urinary and fecal incontinence 05/30/2021   Epistaxis 05/27/2021   Status post coronary artery stent placement    Coronary artery disease involving native coronary artery of native heart with unstable angina pectoris ([place])    Hypertensive urgency 05/20/2021   History of non-ST elevation myocardial infarction (NSTEMI) 05/20/2021   BKA stump complication ([place]) [id number]   Right below-knee amputee ([place]) 02/08/2021   Peripheral arterial disease ([place]) 01/29/2021   Anxiety 05/28/2020   Patient's other noncompliance with medication regimen 12/28/2019   Major depressive disorder, single episode, unspecified 04/24/2019   Secondary hyperparathyroidism of renal origin ([place]) 04/24/2019   S/P AKA (above knee amputation), left ([place]) 12/16/2018   Anemia due to chronic kidney disease  Moderate protein-calorie malnutrition (H[facility])    Adjustment disorder with mixed anxiety and depressed mood 08/15/2018   ESRD (end stage renal disease) ([place])    Fecal incontinence 02/04/2018   Aortic atherosclerosis ([place]) 09/30/2016   Human immunodeficiency virus (HIV) disease ([place]) 04/23/2016   Gastroparesis    Moderate nonproliferative diabetic retinopathy of both eyes ([place]) 11/21/2014   Esophageal reflux    Essential  hypertension 11/16/2013   Normocytic anemia 11/15/2013   Depression 06/28/2006   Diabetes mellitus type 2 in obese ([place]) 06/28/1994   Home Medication(s) Prior to Admission medications   Medication Sig Start Date End Date Taking? Authorizing Provider  Accu-Chek Softclix Lancets lancets Use as instructed 05/02/21   Atway, Rayann N, DO  albuterol (VENTOLIN HFA) 108 (90 Base) MCG/ACT inhaler Inhale 2 puffs into the lungs every 4 (four) hours as needed for wheezing or shortness of breath. 02/14/21   Angiulli, Lavon Paganini, PA-C  amLODipine (NORVASC) 10 MG tablet Take 1 tablet (10 mg total) by mouth daily. Patient not taking: Reported on 08/26/2022 05/15/21   [name], MD  amLODipine-olmesartan (AZOR) 10-40 MG tablet Take by mouth. 07/16/22   [provider]  amoxicillin-clavulanate (AUGMENTIN) 500-125 MG tablet Take 1 tablet by mouth at bedtime. Patient not taking: Reported on 08/26/2022 06/26/22   Virl Axe, MD  atorvastatin (LIPITOR) 80 MG tablet Take 1 tablet (80 mg total) by mouth daily. Patient not taking: Reported on 06/28/2022 05/24/21   Masters, Joellen Jersey, DO  bictegravir-emtricitabine-tenofovir AF (BIKTARVY) 50-200-25 MG TABS tablet Take 1 tablet by mouth daily. OVERDUE FOR OFFICE FOLLOW UP - PLEASE CALL OUR OFFICE TO SCHEDULE [phone] 08/26/22   Michel Bickers, MD  calcitRIOL (ROCALTROL) 0.25 MCG capsule Take 5 capsules (1.25 mcg total) by mouth Every Tuesday,Thursday,and Saturday with dialysis. 07/02/22   Rick Duff, MD  calcium acetate (PHOSLO) 667 MG capsule Take 2 capsules (1,334 mg total) by mouth 3 (three) times daily with meals. 06/30/22   Rick Duff, MD  Continuous Blood Gluc Receiver (DEXCOM G7 RECEIVER) DEVI Use to check blood sugars cntinuously 06/30/22   Rick Duff, MD  Continuous Blood Gluc Sensor (DEXCOM G7 SENSOR) MISC Use to check blood sugar continuously 06/30/22   Rick Duff, MD  cyclobenzaprine (FLEXERIL) 5 MG tablet Take 5 mg by mouth  2 (two) times daily as needed for muscle spasms. 05/21/22   [provider]  Darbepoetin Alfa (ARANESP) 200 MCG/0.4ML SOSY injection Inject 0.4 mLs (200 mcg total) into the skin every Monday at 6 PM. 07/06/22   Rick Duff, MD  EQ ASPIRIN ADULT LOW DOSE 81 MG tablet TAKE 1 TABLET BY MOUTH ONCE DAILY. SWALLOW WHOLE. 02/06/22   Waynetta Sandy, MD  glucose blood test strip Use as instructed 05/02/21   Atway, Rayann N, DO  insulin glargine (LANTUS SOLOSTAR) 100 UNIT/ML Solostar Pen Inject 15 Units into the skin at bedtime. 06/30/22   Rick Duff, MD  Insulin Pen Needle (PENTIPS) 32G X 4 MM MISC Use as directed 02/14/21   Meredith Staggers, MD  liraglutide (VICTOZA) 18 MG/3ML SOPN Inject 0.6 mg into the skin daily. 06/30/22 06/25/23  Rick Duff, MD  [facility] 5 g packet Take by mouth. 07/30/22   [provider]  multivitamin (RENA-VIT) TABS tablet Take 1 tablet by mouth daily. Patient taking differently: Take 1 tablet by mouth See admin instructions. On Tuesday, Wednesday and Thursday 02/14/21   Angiulli, Lavon Paganini, PA-C  polyethylene glycol (MIRALAX / GLYCOLAX) 17 g packet Take 17 g by mouth daily. 07/01/22  Rick Duff, MD                                                                                                                                    Past Surgical History Past Surgical History:  Procedure Laterality Date   ABDOMINAL AORTOGRAM W/LOWER EXTREMITY N/A 12/13/2020   Procedure: ABDOMINAL AORTOGRAM W/LOWER EXTREMITY;  Surgeon: Angelia Mould, MD;  Location: [place] [facility];  Service: Cardiovascular;  Laterality: N/A;   AMPUTATION Left 07/08/2018   Procedure: AMPUTATION FORTH RAY LEFT FOOT;  Surgeon: [name], MD;  Location: [place];  Service: Orthopedics;  Laterality: Left;   AMPUTATION Left 08/09/2018   Procedure: Left Transmetatarsal Amputation;  Surgeon: [name], MD;  Location: [facility];  Service: Orthopedics;  Laterality:  Left;   AMPUTATION Left 10/08/2018   Procedure: LEFT BELOW KNEE AMPUTATION;  Surgeon: [name], MD;  Location: [place];  Service: Orthopedics;  Laterality: Left;   AMPUTATION Left 10/28/2018   Procedure: REVISION BELOW KNEE AMPUTATION;  Surgeon: [name], MD;  Location: [place];  Service: Orthopedics;  Laterality: Left;   AMPUTATION Left 12/16/2018   Procedure: LEFT ABOVE KNEE AMPUTATION;  Surgeon: [name], MD;  Location: [facility];  Service: Orthopedics;  Laterality: Left;   AMPUTATION Right 01/31/2021   Procedure: AMPUTATION BELOW KNEE;  Surgeon: Serafina Mitchell, MD;  [facility];  Service: Vascular;  Laterality: Right;   AMPUTATION Right 03/28/2021   Procedure: REVISION OF RIGHT BELOW KNEE AMPUTATION;  Surgeon: Serafina Mitchell, MD;  Location: [place];  Service: Vascular;  Laterality: Right;   AMPUTATION Right 04/18/2021   Procedure: right below knee amputation/washout placement wound vac;  Surgeon: Cherre Robins, MD;  Location: [facility];  Service: Vascular;  Laterality: Right;   AV FISTULA PLACEMENT Left 10/14/2018   Procedure: Arteriovenous (Av) Fistula Creation Left Arm;  Surgeon: [name], MD;  Location: [place];  Service: Vascular;  Laterality: Left;   [place] Left 04/14/2019   Procedure: BASILIC VEIN TRANSPOSITION SECOND STAGE LEFT ARM;  Surgeon: Rosetta Posner, MD;  Location: [facility];  Service: Vascular;  Laterality: Left;   CORONARY STENT INTERVENTION N/A 05/21/2021   Procedure: CORONARY STENT INTERVENTION;  Surgeon: Leonie Man, MD;  Location: [facility] [facility];  Service: Cardiovascular;  Laterality: N/A;   INCISION AND DRAINAGE ABSCESS N/A 10/24/2020   Procedure: exicision of hydradenitis;  Surgeon: Leighton Ruff, MD;  Location: [facility];  Service: General;  Laterality: N/A;  45 min   INSERTION OF DIALYSIS CATHETER Right 04/14/2019   Procedure: INSERTION OF DIALYSIS CATHETER;  Surgeon: Rosetta Posner, MD;  Location: [facility];  Service:  Vascular;  Laterality: Right;   LEFT HEART CATH AND CORONARY ANGIOGRAPHY N/A 05/21/2021   Procedure: LEFT HEART CATH AND CORONARY ANGIOGRAPHY;  Surgeon: Leonie Man, MD;  Location: [facility];  Service: Cardiovascular;  Laterality: N/A;   LOWER EXTREMITY ANGIOGRAPHY N/A 07/05/2018   Procedure: LOWER EXTREMITY ANGIOGRAPHY;  Surgeon: Serafina Mitchell, MD;  Location: [facility];  Service: Cardiovascular;  Laterality: N/A;   PERIPHERAL VASCULAR ATHERECTOMY Right 12/13/2020   Procedure: PERIPHERAL VASCULAR ATHERECTOMY;  Surgeon: Angelia Mould, MD;  Location: [facility];  Service: Cardiovascular;  Laterality: Right;  Superficial femoral   PERIPHERAL VASCULAR BALLOON ANGIOPLASTY Left 07/05/2018   Procedure: PERIPHERAL VASCULAR BALLOON ANGIOPLASTY;  Surgeon: Serafina Mitchell, MD;  Location: [place] [facility] [facility];  Service: Cardiovascular;  Laterality: Left;  SFA   PERIPHERAL VASCULAR BALLOON ANGIOPLASTY Right 12/13/2020   Procedure: PERIPHERAL VASCULAR BALLOON ANGIOPLASTY;  Surgeon: Angelia Mould, MD;  Location: [facility];  Service: Cardiovascular;  Laterality: Right;  Peroneal artery, anterior tibial artery   RECTAL EXAM UNDER ANESTHESIA N/A 10/24/2020   Procedure: EXAM UNDER ANESTHESIA;  Surgeon: Leighton Ruff, MD;  Location: [facility];  Service: General;  Laterality: N/A;   STUMP REVISION Left 10/19/2018   Procedure: REVISION LEFT BELOW KNEE AMPUTATION;  Surgeon: [name], MD;  Location: [place];  Service: Orthopedics;  Laterality: Left;   TUBAL LIGATION  2002   Family History Family History  Problem Relation Age of Onset   Diabetes Mother    Diabetes Brother    Diabetes Daughter    Diabetes Daughter    Mental retardation Brother        died from PNA   Diabetes Maternal Grandmother     Social History Social History   Tobacco Use   Smoking status: Former    Packs/day: 0.10    Years: 10.00    Total pack years: 1.00     Types: Cigarettes    Start date: 11/17/2013    Quit date: 09/09/2014    Years since quitting: 8.0   Smokeless tobacco: Never  Vaping Use   Vaping Use: Never used  Substance Use Topics   Alcohol use: Not Currently   Drug use: Yes    Types: Marijuana    Comment: 2 times a monthy -  `last time [place] 2022   Allergies Patient has no known allergies.  Review of Systems Review of Systems  All other systems reviewed and are negative.   Physical Exam Vital Signs  I have reviewed the triage vital signs BP (!) 198/75   Pulse 97   Temp 98.9 F (37.2 C)   Resp (!) 25   SpO2 100%  Physical Exam Vitals and nursing note reviewed.  Constitutional:      General: She is not in acute distress.    Appearance: She is well-developed.  HENT:     Head: Normocephalic and atraumatic.     Mouth/Throat:     Mouth: Mucous membranes are moist.  Eyes:     Pupils: Pupils are equal, round, and reactive to light.  Cardiovascular:     Rate and Rhythm: Normal rate and regular rhythm.     Heart sounds: No murmur heard. Pulmonary:     Effort: Pulmonary effort is normal. No respiratory distress.     Breath sounds: Normal breath sounds.  Abdominal:     General: Abdomen is flat.     Palpations: Abdomen is soft.     Tenderness: There is no abdominal tenderness.  Musculoskeletal:        General: No tenderness.     Right lower leg: No edema.     Left lower leg: No  edema.  Skin:    General: Skin is warm and dry.  Neurological:     General: No focal deficit present.     Mental Status: She is alert and oriented to person, place, and time. Mental status is at baseline.     Cranial Nerves: No cranial nerve deficit.  Psychiatric:        Mood and Affect: Mood normal.        Behavior: Behavior normal.     ED Results and Treatments Labs (all labs ordered are listed, but only abnormal results are displayed) Labs Reviewed  CBC - Abnormal; Notable for the following components:      Result Value   RBC  2.32 (*)    Hemoglobin 7.3 (*)    HCT 23.0 (*)    RDW 16.5 (*)    nRBC 0.6 (*)    All other components within normal limits  COMPREHENSIVE METABOLIC PANEL - Abnormal; Notable for the following components:   Chloride 97 (*)    Glucose, Bld 179 (*)    BUN 57 (*)    Creatinine, Ser 7.01 (*)    Albumin 3.0 (*)    AST 14 (*)    Total Bilirubin 0.1 (*)    GFR, Estimated 7 (*)    Anion gap 16 (*)    All other components within normal limits  TROPONIN I (HIGH SENSITIVITY) - Abnormal; Notable for the following components:   Troponin I (High Sensitivity) 54 (*)    All other components within normal limits  TROPONIN I (HIGH SENSITIVITY) - Abnormal; Notable for the following components:   Troponin I (High Sensitivity) 49 (*)    All other components within normal limits                                                                                                                          Radiology DG Chest 1 View  Result Date: 09/27/2022 CLINICAL DATA:  47 year old female with history of shortness of breath. EXAM: CHEST  1 VIEW COMPARISON:  Chest x-ray 09/15/2022. FINDINGS: Lung volumes are normal. No consolidative airspace disease. No pleural effusions. No pneumothorax. No pulmonary nodule or mass noted. Pulmonary vasculature and the cardiomediastinal silhouette are within normal limits. Atherosclerosis in the thoracic aorta. IMPRESSION: 1. No radiographic evidence of acute cardiopulmonary disease. 2. Aortic atherosclerosis. Electronically Signed   By: Vinnie Langton M.D.   On: 09/27/2022 18:54    Pertinent labs & imaging results that were available during my care of the patient were reviewed by me and considered in my medical decision making (see MDM for details).  Medications Ordered in ED Medications  hydrALAZINE (APRESOLINE) tablet 50 mg (50 mg Oral Given 09/27/22 2039)  acetaminophen (TYLENOL) tablet 1,000 mg (1,000 mg Oral Given 09/27/22 2210)  prochlorperazine (COMPAZINE) injection 10  mg (10 mg Intravenous Given 09/27/22 2210)  diphenhydrAMINE (BENADRYL) injection 12.5 mg (12.5 mg Intravenous Given 09/27/22 2211)  Procedures Procedures  (including critical care time)  Medical Decision Making / ED Course   MDM:  47 year old female presenting to the emergency department with concern for abnormal lab, migraine as well as chest pain.  Patient well-appearing, she feels that her headache is similar to her prior migraines.  Will treat with migraine cocktail.  No sudden onset, worst headache to suggest subarachnoid.  No new symptoms to suggest space-occupying lesion or acute bleed.  Patient denies head trauma. Patient also concerned thatHemoglobin might be low as she had a nosebleed today.  Hemoglobin reassuring and nosebleed has stopped.  Patient further reports some chest pain.  She reports that is not pleuritic, not exertional, symptoms seem relatively atypical for ACS.  Initial troponin is actually lower than her typical troponin on previous checks.  Will delta troponin given its elevation.  EKG has no acute changes.  Low concern for alternative cause such as pulmonary embolism, no pleuritic pain, hypoxia.  Chest x-ray clear with no acute process.  Will reassess.  Doubt dissection.   Clinical Course as of 09/27/22 2335  Nancy Fetter Sep 27, 2022  2332 Repeat troponin downtrending.  Patient reports her migraine has resolved.  Low concern for ACS, but given history of coronary artery disease and chest pain, will place referral to cardiology for expedited follow-up.  Patient may benefit from stress testing. Will discharge patient to home. All questions answered. Patient comfortable with plan of discharge. Return precautions discussed with patient and specified on the after visit summary. [WS]    Clinical Course User Index [WS] Cristie Hem, MD      Additional history obtained: -External records from outside source obtained and reviewed including: Chart review including previous notes, labs, imaging, consultation notes including ED visit 09/15/22   Lab Tests: -I ordered, reviewed, and interpreted labs.   The pertinent results include:   Labs Reviewed  CBC - Abnormal; Notable for the following components:      Result Value   RBC 2.32 (*)    Hemoglobin 7.3 (*)    HCT 23.0 (*)    RDW 16.5 (*)    nRBC 0.6 (*)    All other components within normal limits  COMPREHENSIVE METABOLIC PANEL - Abnormal; Notable for the following components:   Chloride 97 (*)    Glucose, Bld 179 (*)    BUN 57 (*)    Creatinine, Ser 7.01 (*)    Albumin 3.0 (*)    AST 14 (*)    Total Bilirubin 0.1 (*)    GFR, Estimated 7 (*)    Anion gap 16 (*)    All other components within normal limits  TROPONIN I (HIGH SENSITIVITY) - Abnormal; Notable for the following components:   Troponin I (High Sensitivity) 54 (*)    All other components within normal limits  TROPONIN I (HIGH SENSITIVITY) - Abnormal; Notable for the following components:   Troponin I (High Sensitivity) 49 (*)    All other components within normal limits    Notable for downtrending troponin, stable anemia   EKG   EKG Interpretation  Date/Time:  Sunday September 27 2022 20:30:25 EST Ventricular Rate:  96 PR Interval:  152 QRS Duration: 93 QT Interval:  379 QTC Calculation: 479 R Axis:   84 Text Interpretation: Sinus rhythm ST elevation, consider early repolarization No significant change since last tracing Confirmed by Garnette Gunner [phone]) on 09/27/2022 9:03:53 PM         Imaging Studies ordered: I ordered imaging studies including CXR  On my interpretation imaging demonstrates no acute process I independently visualized and interpreted imaging. I agree with the radiologist interpretation   Medicines ordered and prescription drug management: Meds ordered this  encounter  Medications   hydrALAZINE (APRESOLINE) tablet 50 mg   acetaminophen (TYLENOL) tablet 1,000 mg   prochlorperazine (COMPAZINE) injection 10 mg   diphenhydrAMINE (BENADRYL) injection 12.5 mg    -I have reviewed the patients home medicines and have made adjustments as needed  Cardiac Monitoring: The patient was maintained on a cardiac monitor.  I personally viewed and interpreted the cardiac monitored which showed an underlying rhythm of: NSR  Social Determinants of Health:  Diagnosis or treatment significantly limited by social determinants of health: obesity   Reevaluation: After the interventions noted above, I reevaluated the patient and found that they have resolved  Co morbidities that complicate the patient evaluation  Past Medical History:  Diagnosis Date   Anal abscess    chronic   Anxiety    CAD (coronary artery disease)    CVA (cerebral vascular accident) ([place])    Depression 06/28/2006   Qualifier: Diagnosis of  By: Riccardo Dubin MD, Todd     Diabetes mellitus type 2 in obese ([place]) 06/28/1994   Dyspnea    uses oxygeb 2 liters per minute at dialysis   End stage renal disease on dialysis [facility])    on hemodialysis T/Th/Sat   Erosive esophagitis    Esophageal reflux    Eye redness    Gastroparesis    ? diabetic   GERD (gastroesophageal reflux disease)    History of blood transfusion 2019   Human immunodeficiency virus (HIV) disease ([place]) 04/23/2016   Hyperlipidemia    Hypertension    Metabolic bone disease [id number]   Moderate nonproliferative diabetic retinopathy of both eyes ([place]) 11/21/2014   11/14/14: Noted on retinal imaging; needs follow-up imaging in 6 months  05/22/16: Noted on retinal imaging again; needs follow-up imaging in 6 months   PAD (peripheral artery disease) ([place])    Type 2 diabetes mellitus with diabetic peripheral angiopathy without gangrene ([place]) 05/01/2019   Wears dentures    lower   Wound infection s/p L transmetatarsal amputation        Dispostion: Disposition decision including need for hospitalization was considered, and patient discharged from emergency department.    Final Clinical Impression(s) / ED Diagnoses Final diagnoses:  Other migraine without status migrainosus, not intractable  Atypical chest pain     This chart was dictated using voice recognition software.  Despite best efforts to proofread,  errors can occur which can change the documentation meaning.    Cristie Hem, MD 09/27/22 [phone]

## 2022-09-29 ENCOUNTER — Other Ambulatory Visit: Payer: Self-pay

## 2022-09-29 ENCOUNTER — Emergency Department (HOSPITAL_COMMUNITY): Payer: 59

## 2022-09-29 ENCOUNTER — Inpatient Hospital Stay (HOSPITAL_COMMUNITY)
Admission: EM | Admit: 2022-09-29 | Discharge: 2022-10-02 | DRG: 313 | Disposition: A | Discharge status: Home / Self Care | Payer: 59 | Attending: Internal Medicine | Admitting: Internal Medicine

## 2022-09-29 ENCOUNTER — Telehealth: Payer: Self-pay | Admitting: *Deleted

## 2022-09-29 ENCOUNTER — Encounter (HOSPITAL_COMMUNITY): Payer: Self-pay

## 2022-09-29 DIAGNOSIS — E1143 Type 2 diabetes mellitus with diabetic autonomic (poly)neuropathy: Secondary | ICD-10-CM | POA: Diagnosis present

## 2022-09-29 DIAGNOSIS — I120 Hypertensive chronic kidney disease with stage 5 chronic kidney disease or end stage renal disease: Secondary | ICD-10-CM | POA: Diagnosis present

## 2022-09-29 DIAGNOSIS — I4901 Ventricular fibrillation: Secondary | ICD-10-CM | POA: Diagnosis not present

## 2022-09-29 DIAGNOSIS — Z992 Dependence on renal dialysis: Secondary | ICD-10-CM | POA: Diagnosis not present

## 2022-09-29 DIAGNOSIS — R519 Headache, unspecified: Secondary | ICD-10-CM | POA: Diagnosis not present

## 2022-09-29 DIAGNOSIS — M546 Pain in thoracic spine: Secondary | ICD-10-CM | POA: Diagnosis present

## 2022-09-29 DIAGNOSIS — K3184 Gastroparesis: Secondary | ICD-10-CM | POA: Diagnosis present

## 2022-09-29 DIAGNOSIS — Z89511 Acquired absence of right leg below knee: Secondary | ICD-10-CM

## 2022-09-29 DIAGNOSIS — K219 Gastro-esophageal reflux disease without esophagitis: Secondary | ICD-10-CM | POA: Diagnosis present

## 2022-09-29 DIAGNOSIS — I700 Atherosclerosis of aorta: Secondary | ICD-10-CM | POA: Diagnosis present

## 2022-09-29 DIAGNOSIS — Z89612 Acquired absence of left leg above knee: Secondary | ICD-10-CM

## 2022-09-29 DIAGNOSIS — N2581 Secondary hyperparathyroidism of renal origin: Secondary | ICD-10-CM | POA: Diagnosis not present

## 2022-09-29 DIAGNOSIS — R079 Chest pain, unspecified: Secondary | ICD-10-CM | POA: Insufficient documentation

## 2022-09-29 DIAGNOSIS — Z833 Family history of diabetes mellitus: Secondary | ICD-10-CM

## 2022-09-29 DIAGNOSIS — D631 Anemia in chronic kidney disease: Secondary | ICD-10-CM | POA: Diagnosis present

## 2022-09-29 DIAGNOSIS — J189 Pneumonia, unspecified organism: Principal | ICD-10-CM | POA: Diagnosis present

## 2022-09-29 DIAGNOSIS — Z794 Long term (current) use of insulin: Secondary | ICD-10-CM

## 2022-09-29 DIAGNOSIS — B20 Human immunodeficiency virus [HIV] disease: Secondary | ICD-10-CM | POA: Diagnosis present

## 2022-09-29 DIAGNOSIS — M898X9 Other specified disorders of bone, unspecified site: Secondary | ICD-10-CM | POA: Diagnosis present

## 2022-09-29 DIAGNOSIS — Z79899 Other long term (current) drug therapy: Secondary | ICD-10-CM

## 2022-09-29 DIAGNOSIS — E1122 Type 2 diabetes mellitus with diabetic chronic kidney disease: Secondary | ICD-10-CM | POA: Diagnosis present

## 2022-09-29 DIAGNOSIS — I272 Pulmonary hypertension, unspecified: Secondary | ICD-10-CM | POA: Diagnosis present

## 2022-09-29 DIAGNOSIS — R197 Diarrhea, unspecified: Secondary | ICD-10-CM | POA: Diagnosis not present

## 2022-09-29 DIAGNOSIS — Z955 Presence of coronary angioplasty implant and graft: Secondary | ICD-10-CM

## 2022-09-29 DIAGNOSIS — F32A Depression, unspecified: Secondary | ICD-10-CM | POA: Diagnosis present

## 2022-09-29 DIAGNOSIS — E1151 Type 2 diabetes mellitus with diabetic peripheral angiopathy without gangrene: Secondary | ICD-10-CM | POA: Diagnosis present

## 2022-09-29 DIAGNOSIS — Z8673 Personal history of transient ischemic attack (TIA), and cerebral infarction without residual deficits: Secondary | ICD-10-CM

## 2022-09-29 DIAGNOSIS — Z87891 Personal history of nicotine dependence: Secondary | ICD-10-CM

## 2022-09-29 DIAGNOSIS — R0789 Other chest pain: Principal | ICD-10-CM | POA: Diagnosis present

## 2022-09-29 DIAGNOSIS — Z1152 Encounter for screening for COVID-19: Secondary | ICD-10-CM

## 2022-09-29 DIAGNOSIS — N186 End stage renal disease: Secondary | ICD-10-CM

## 2022-09-29 DIAGNOSIS — F419 Anxiety disorder, unspecified: Secondary | ICD-10-CM | POA: Diagnosis present

## 2022-09-29 DIAGNOSIS — E669 Obesity, unspecified: Secondary | ICD-10-CM | POA: Diagnosis present

## 2022-09-29 DIAGNOSIS — I2510 Atherosclerotic heart disease of native coronary artery without angina pectoris: Secondary | ICD-10-CM | POA: Diagnosis present

## 2022-09-29 DIAGNOSIS — Z7985 Long-term (current) use of injectable non-insulin antidiabetic drugs: Secondary | ICD-10-CM

## 2022-09-29 DIAGNOSIS — I442 Atrioventricular block, complete: Secondary | ICD-10-CM | POA: Diagnosis not present

## 2022-09-29 DIAGNOSIS — E785 Hyperlipidemia, unspecified: Secondary | ICD-10-CM | POA: Diagnosis present

## 2022-09-29 DIAGNOSIS — Z6827 Body mass index (BMI) 27.0-27.9, adult: Secondary | ICD-10-CM

## 2022-09-29 DIAGNOSIS — E113393 Type 2 diabetes mellitus with moderate nonproliferative diabetic retinopathy without macular edema, bilateral: Secondary | ICD-10-CM | POA: Diagnosis present

## 2022-09-29 HISTORY — DX: Pneumonia, unspecified organism: J18.9

## 2022-09-29 LAB — BASIC METABOLIC PANEL
Anion gap: 16 — ABNORMAL HIGH (ref 5–15)
BUN: 30 mg/dL — ABNORMAL HIGH (ref 6–20)
CO2: 25 mmol/L (ref 22–32)
Calcium: 9.4 mg/dL (ref 8.9–10.3)
Chloride: 94 mmol/L — ABNORMAL LOW (ref 98–111)
Creatinine, Ser: 4.53 mg/dL — ABNORMAL HIGH (ref 0.44–1.00)
GFR, Estimated: 12 mL/min — ABNORMAL LOW (ref >60)
Glucose, Bld: 222 mg/dL — ABNORMAL HIGH (ref 70–99)
Potassium: 3.9 mmol/L (ref 3.5–5.1)
Sodium: 135 mmol/L (ref 135–145)

## 2022-09-29 LAB — RESP PANEL BY RT-PCR (RSV, FLU A&B, COVID)  RVPGX2
Influenza A by PCR: NEGATIVE
Influenza B by PCR: NEGATIVE
Resp Syncytial Virus by PCR: NEGATIVE
SARS Coronavirus 2 by RT PCR: NEGATIVE

## 2022-09-29 LAB — HEPATIC FUNCTION PANEL
ALT: 9 U/L (ref 0–44)
AST: 12 U/L — ABNORMAL LOW (ref 15–41)
Albumin: 2.9 g/dL — ABNORMAL LOW (ref 3.5–5.0)
Alkaline Phosphatase: 45 U/L (ref 38–126)
Bilirubin, Direct: <0.1 mg/dL (ref 0.0–0.2)
Total Bilirubin: <0.1 mg/dL — ABNORMAL LOW (ref 0.3–1.2)
Total Protein: 7.9 g/dL (ref 6.5–8.1)

## 2022-09-29 LAB — CBC WITH DIFFERENTIAL/PLATELET
Abs Immature Granulocytes: 0.01 10*3/uL (ref 0.00–0.07)
Basophils Absolute: 0.1 10*3/uL (ref 0.0–0.1)
Basophils Relative: 1 %
Eosinophils Absolute: 0.2 10*3/uL (ref 0.0–0.5)
Eosinophils Relative: 2 %
HCT: 20.7 % — ABNORMAL LOW (ref 36.0–46.0)
Hemoglobin: 6.7 g/dL — CL (ref 12.0–15.0)
Immature Granulocytes: 0 %
Lymphocytes Relative: 13 %
Lymphs Abs: 1 10*3/uL (ref 0.7–4.0)
MCH: 31.6 pg (ref 26.0–34.0)
MCHC: 32.4 g/dL (ref 30.0–36.0)
MCV: 97.6 fL (ref 80.0–100.0)
Monocytes Absolute: 0.5 10*3/uL (ref 0.1–1.0)
Monocytes Relative: 7 %
Neutro Abs: 5.7 10*3/uL (ref 1.7–7.7)
Neutrophils Relative %: 77 %
Platelets: 330 10*3/uL (ref 150–400)
RBC: 2.12 MIL/uL — ABNORMAL LOW (ref 3.87–5.11)
RDW: 17.1 % — ABNORMAL HIGH (ref 11.5–15.5)
WBC: 7.4 10*3/uL (ref 4.0–10.5)
nRBC: 0 % (ref 0.0–0.2)

## 2022-09-29 LAB — PREPARE RBC (CROSSMATCH)

## 2022-09-29 LAB — CBG MONITORING, ED: Glucose-Capillary: 177 mg/dL — ABNORMAL HIGH (ref 70–99)

## 2022-09-29 MED ORDER — CALCITRIOL 0.5 MCG PO CAPS: 1.2500 ug | ORAL_CAPSULE | ORAL | Status: DC

## 2022-09-29 MED ORDER — HEPARIN SODIUM (PORCINE) 5000 UNIT/ML IJ SOLN
5000.0000 [IU] | Freq: Three times a day (TID) | INTRAMUSCULAR | Status: DC
  Administered 2022-09-29 – 2022-10-02 (×6): 5000 [IU] via SUBCUTANEOUS
  Filled 2022-09-29 (×6): qty 1

## 2022-09-29 MED ORDER — ALBUTEROL SULFATE (2.5 MG/3ML) 0.083% IN NEBU: 3.0000 mL | INHALATION_SOLUTION | RESPIRATORY_TRACT | Status: DC | PRN

## 2022-09-29 MED ORDER — ASPIRIN 81 MG PO TBEC
81.0000 mg | DELAYED_RELEASE_TABLET | Freq: Every day | ORAL | Status: DC
  Administered 2022-09-29 – 2022-10-02 (×4): 81 mg via ORAL
  Filled 2022-09-29 (×4): qty 1

## 2022-09-29 MED ORDER — CALCIUM ACETATE (PHOS BINDER) 667 MG PO CAPS
1334.0000 mg | ORAL_CAPSULE | Freq: Three times a day (TID) | ORAL | Status: DC
  Administered 2022-09-30 – 2022-10-02 (×5): 1334 mg via ORAL
  Filled 2022-09-29 (×6): qty 2

## 2022-09-29 MED ORDER — INSULIN GLARGINE-YFGN 100 UNIT/ML ~~LOC~~ SOLN
15.0000 [IU] | Freq: Every day | SUBCUTANEOUS | Status: DC
  Administered 2022-09-29 – 2022-10-01 (×2): 15 [IU] via SUBCUTANEOUS
  Filled 2022-09-29 (×4): qty 0.15

## 2022-09-29 MED ORDER — SODIUM CHLORIDE 0.9 % IV SOLN
1.0000 g | Freq: Once | INTRAVENOUS | Status: AC
  Administered 2022-09-29: 1 g via INTRAVENOUS
  Filled 2022-09-29: qty 10

## 2022-09-29 MED ORDER — AMLODIPINE-OLMESARTAN 10-40 MG PO TABS: 1.0000 | ORAL_TABLET | Freq: Every day | ORAL | Status: DC

## 2022-09-29 MED ORDER — HYDROMORPHONE HCL 1 MG/ML IJ SOLN
1.0000 mg | Freq: Once | INTRAMUSCULAR | Status: AC
  Administered 2022-09-29: 1 mg via INTRAVENOUS
  Filled 2022-09-29: qty 1

## 2022-09-29 MED ORDER — SODIUM CHLORIDE 0.9 % IV SOLN
500.0000 mg | Freq: Once | INTRAVENOUS | Status: AC
  Administered 2022-09-29: 500 mg via INTRAVENOUS
  Filled 2022-09-29: qty 5

## 2022-09-29 MED ORDER — CYCLOBENZAPRINE HCL 5 MG PO TABS
5.0000 mg | ORAL_TABLET | Freq: Two times a day (BID) | ORAL | Status: DC | PRN
  Administered 2022-09-30 – 2022-10-02 (×6): 5 mg via ORAL
  Filled 2022-09-29 (×8): qty 1

## 2022-09-29 MED ORDER — IRBESARTAN 300 MG PO TABS
300.0000 mg | ORAL_TABLET | Freq: Every day | ORAL | Status: DC
  Administered 2022-09-30 – 2022-10-02 (×3): 300 mg via ORAL
  Filled 2022-09-29 (×3): qty 1

## 2022-09-29 MED ORDER — LIDOCAINE 5 % EX PTCH
1.0000 | MEDICATED_PATCH | CUTANEOUS | Status: DC
  Administered 2022-09-29 – 2022-09-30 (×2): 1 via TRANSDERMAL
  Filled 2022-09-29 (×2): qty 1

## 2022-09-29 MED ORDER — BENZONATATE 100 MG PO CAPS
100.0000 mg | ORAL_CAPSULE | Freq: Once | ORAL | Status: DC
  Filled 2022-09-29: qty 1

## 2022-09-29 MED ORDER — SODIUM CHLORIDE 0.9 % IV SOLN
1.0000 g | INTRAVENOUS | Status: DC
  Administered 2022-09-30 – 2022-10-01 (×2): 1 g via INTRAVENOUS
  Filled 2022-09-29 (×2): qty 10

## 2022-09-29 MED ORDER — SODIUM CHLORIDE 0.9% IV SOLUTION: Freq: Once | INTRAVENOUS | Status: DC

## 2022-09-29 MED ORDER — SODIUM CHLORIDE 0.9 % IV SOLN
500.0000 mg | INTRAVENOUS | Status: DC
  Administered 2022-09-30 – 2022-10-02 (×2): 500 mg via INTRAVENOUS
  Filled 2022-09-29 (×4): qty 5

## 2022-09-29 MED ORDER — BICTEGRAVIR-EMTRICITAB-TENOFOV 50-200-25 MG PO TABS
1.0000 | ORAL_TABLET | Freq: Every day | ORAL | Status: DC
  Administered 2022-09-30 – 2022-10-02 (×3): 1 via ORAL
  Filled 2022-09-29 (×3): qty 1

## 2022-09-29 MED ORDER — INSULIN ASPART 100 UNIT/ML IJ SOLN
0.0000 [IU] | Freq: Three times a day (TID) | INTRAMUSCULAR | Status: DC
  Administered 2022-09-30 – 2022-10-01 (×2): 1 [IU] via SUBCUTANEOUS

## 2022-09-29 MED ORDER — ATORVASTATIN CALCIUM 80 MG PO TABS
80.0000 mg | ORAL_TABLET | Freq: Every day | ORAL | Status: DC
  Administered 2022-09-29 – 2022-10-02 (×4): 80 mg via ORAL
  Filled 2022-09-29 (×3): qty 1
  Filled 2022-09-29: qty 2

## 2022-09-29 MED ORDER — AMLODIPINE BESYLATE 10 MG PO TABS
10.0000 mg | ORAL_TABLET | Freq: Every day | ORAL | Status: DC
  Administered 2022-09-29 – 2022-10-02 (×4): 10 mg via ORAL
  Filled 2022-09-29: qty 2
  Filled 2022-09-29 (×3): qty 1

## 2022-09-29 MED ORDER — GUAIFENESIN ER 600 MG PO TB12: 600.0000 mg | ORAL_TABLET | Freq: Two times a day (BID) | ORAL | Status: DC | PRN

## 2022-09-29 MED ORDER — ACETAMINOPHEN 325 MG PO TABS
650.0000 mg | ORAL_TABLET | Freq: Four times a day (QID) | ORAL | Status: DC | PRN
  Administered 2022-09-30 (×2): 650 mg via ORAL
  Filled 2022-09-29 (×3): qty 2

## 2022-09-29 MED ORDER — SENNOSIDES-DOCUSATE SODIUM 8.6-50 MG PO TABS: 1.0000 | ORAL_TABLET | Freq: Every evening | ORAL | Status: DC | PRN

## 2022-09-29 MED ORDER — ACETAMINOPHEN 650 MG RE SUPP: 650.0000 mg | Freq: Four times a day (QID) | RECTAL | Status: DC | PRN

## 2022-09-29 MED ORDER — ONDANSETRON HCL 4 MG/2ML IJ SOLN
4.0000 mg | Freq: Once | INTRAMUSCULAR | Status: AC
  Administered 2022-09-29: 4 mg via INTRAVENOUS
  Filled 2022-09-29: qty 2

## 2022-09-29 MED ORDER — INSULIN ASPART 100 UNIT/ML IJ SOLN: 0.0000 [IU] | Freq: Every day | INTRAMUSCULAR | Status: DC

## 2022-09-29 NOTE — ED Notes (Signed)
Pt vomited ~300 ml; medication provided

## 2022-09-29 NOTE — Telephone Encounter (Signed)
Call from patient c/o of back pain and increasing fluid build up.  Just left Dialysis and feels bad. Patient is upset and in tears from her pain.   Wants to be seen in the Clinics.  No available appointments.  Patient c/o back pain for a few days. Pain causes her to be short of breath at times  Advised to go to the ER for assessment of her symptoms.  States was old that she would need a transfusion but was unable to do at recent visit to the ER.  Advised to go to the ER as soon as possible.  Patient states will return to the ER.

## 2022-09-29 NOTE — ED Triage Notes (Addendum)
Patient comes in from dialysis and left dialysis after they took 3 kg but need 5 kg removed but dialysis only took 3.Complains of severe lower back pain.  Reports she is still in fluid overload but couldn't stay at dialysis.  Patient is crying and moaning in triage. Scheduled for a blood transfusion friday

## 2022-09-29 NOTE — ED Notes (Addendum)
ED TO INPATIENT HANDOFF REPORT  ED Nurse Name and Phone #:   S Name/Age/Gender Kathy Frank 47 y.o. female Room/Bed: 022C/022C  Code Status   Code Status: Full Code  Home/SNF/Other Home Patient oriented to: self, place, time, and situation Is this baseline? Yes   Triage Complete: Triage complete  Chief Complaint CAP (community acquired pneumonia) [J18.9]  Triage Note Patient comes in from dialysis and left dialysis after they took 3 kg but need 5 kg removed but dialysis only took 3.Complains of severe lower back pain.  Reports she is still in fluid overload but couldn't stay at dialysis.  Patient is crying and moaning in triage. Scheduled for a blood transfusion friday   Allergies No Known Allergies  Level of Care/Admitting Diagnosis ED Disposition     ED Disposition  Admit   Condition  --   Comment  Hospital Area: [place] [[id number]]  Level of Care: Telemetry Medical [[id number]]  May place patient in observation at [facility] or [place] if equivalent level of care is available:: No  Covid Evaluation: Asymptomatic - no recent exposure (last 10 days) testing not required  Diagnosis: CAP (community acquired pneumonia) [id number]  Admitting Physician: Angelica Pou [place]  Attending Physician: Angelica Pou [[id number]]          B Medical/Surgery History Past Medical History:  Diagnosis Date   Anal abscess    chronic   Anxiety    CAD (coronary artery disease)    CVA (cerebral vascular accident) ([place])    Depression 06/28/2006   Qualifier: Diagnosis of  By: Riccardo Dubin MD, [place]     Diabetes mellitus type 2 in obese ([place]) 06/28/1994   Dyspnea    uses oxygeb 2 liters per minute at dialysis   End stage renal disease on dialysis [facility])    on hemodialysis T/Th/Sat   Erosive esophagitis    Esophageal reflux    Eye redness    Gastroparesis    ? diabetic   GERD (gastroesophageal reflux disease)    History of blood transfusion 2019    Human immunodeficiency virus (HIV) disease ([place]) 04/23/2016   Hyperlipidemia    Hypertension    Metabolic bone disease [id number]   Moderate nonproliferative diabetic retinopathy of both eyes ([place]) 11/21/2014   11/14/14: Noted on retinal imaging; needs follow-up imaging in 6 months  05/22/16: Noted on retinal imaging again; needs follow-up imaging in 6 months   PAD (peripheral artery disease) ([place])    Type 2 diabetes mellitus with diabetic peripheral angiopathy without gangrene ([place]) 05/01/2019   Wears dentures    lower   Wound infection s/p L transmetatarsal amputation    Past Surgical History:  Procedure Laterality Date   ABDOMINAL AORTOGRAM W/LOWER EXTREMITY N/A 12/13/2020   Procedure: ABDOMINAL AORTOGRAM W/LOWER EXTREMITY;  Surgeon: Angelia Mould, MD;  Location: [place] [facility];  Service: Cardiovascular;  Laterality: N/A;   AMPUTATION Left 07/08/2018   Procedure: AMPUTATION FORTH RAY LEFT FOOT;  Surgeon: [name], MD;  Location: [place];  Service: Orthopedics;  Laterality: Left;   AMPUTATION Left 08/09/2018   Procedure: Left Transmetatarsal Amputation;  Surgeon: [name], MD;  Location: [place];  Service: Orthopedics;  Laterality: Left;   AMPUTATION Left 10/08/2018   Procedure: LEFT BELOW KNEE AMPUTATION;  Surgeon: [name], MD;  Location: [place];  Service: Orthopedics;  Laterality: Left;   AMPUTATION Left 10/28/2018   Procedure: REVISION BELOW KNEE AMPUTATION;  Surgeon: [name], MD;  Location: [place];  Service: Orthopedics;  Laterality: Left;   AMPUTATION Left 12/16/2018   Procedure: LEFT ABOVE KNEE AMPUTATION;  Surgeon: [name], MD;  Location: [place];  Service: Orthopedics;  Laterality: Left;   AMPUTATION Right 01/31/2021   Procedure: AMPUTATION BELOW KNEE;  Surgeon: Serafina Mitchell, MD;  Location: [facility];  Service: Vascular;  Laterality: Right;   AMPUTATION Right 03/28/2021   Procedure: REVISION OF RIGHT BELOW KNEE AMPUTATION;  Surgeon: Serafina Mitchell,  MD;  Location: [place];  Service: Vascular;  Laterality: Right;   AMPUTATION Right 04/18/2021   Procedure: right below knee amputation/washout placement wound vac;  Surgeon: Cherre Robins, MD;  Location: [place];  Service: Vascular;  Laterality: Right;   AV FISTULA PLACEMENT Left 10/14/2018   Procedure: Arteriovenous (Av) Fistula Creation Left Arm;  Surgeon: [name], MD;  Location: [place];  Service: Vascular;  Laterality: Left;   [place] Left 04/14/2019   Procedure: BASILIC VEIN TRANSPOSITION SECOND STAGE LEFT ARM;  Surgeon: Rosetta Posner, MD;  Location: [place];  Service: Vascular;  Laterality: Left;   CORONARY STENT INTERVENTION N/A 05/21/2021   Procedure: CORONARY STENT INTERVENTION;  Surgeon: Leonie Man, MD;  Location: [place] [facility];  Service: Cardiovascular;  Laterality: N/A;   INCISION AND DRAINAGE ABSCESS N/A 10/24/2020   Procedure: exicision of hydradenitis;  Surgeon: Leighton Ruff, MD;  Location: [place];  Service: General;  Laterality: N/A;  45 min   INSERTION OF DIALYSIS CATHETER Right 04/14/2019   Procedure: INSERTION OF DIALYSIS CATHETER;  Surgeon: Rosetta Posner, MD;  Location: [facility];  Service: Vascular;  Laterality: Right;   LEFT HEART CATH AND CORONARY ANGIOGRAPHY N/A 05/21/2021   Procedure: LEFT HEART CATH AND CORONARY ANGIOGRAPHY;  Surgeon: Leonie Man, MD;  Location: [place] [facility];  Service: Cardiovascular;  Laterality: N/A;   LOWER EXTREMITY ANGIOGRAPHY N/A 07/05/2018   Procedure: LOWER EXTREMITY ANGIOGRAPHY;  Surgeon: Serafina Mitchell, MD;  Location: [place] [facility];  Service: Cardiovascular;  Laterality: N/A;   PERIPHERAL VASCULAR ATHERECTOMY Right 12/13/2020   Procedure: PERIPHERAL VASCULAR ATHERECTOMY;  Surgeon: Angelia Mould, MD;  Location: [place] [facility];  Service: Cardiovascular;  Laterality: Right;  Superficial femoral   PERIPHERAL VASCULAR BALLOON ANGIOPLASTY Left 07/05/2018   Procedure:  PERIPHERAL VASCULAR BALLOON ANGIOPLASTY;  Surgeon: Serafina Mitchell, MD;  Location: [place] [facility];  Service: Cardiovascular;  Laterality: Left;  SFA   PERIPHERAL VASCULAR BALLOON ANGIOPLASTY Right 12/13/2020   Procedure: PERIPHERAL VASCULAR BALLOON ANGIOPLASTY;  Surgeon: Angelia Mould, MD;  Location: [place] [facility];  Service: Cardiovascular;  Laterality: Right;  Peroneal artery, anterior tibial artery   RECTAL EXAM UNDER ANESTHESIA N/A 10/24/2020   Procedure: EXAM UNDER ANESTHESIA;  Surgeon: Leighton Ruff, MD;  Location[facility];  Service: General;  Laterality: N/A;   STUMP REVISION Left 10/19/2018   Procedure: REVISION LEFT BELOW KNEE AMPUTATION;  Surgeon: [name], MD;  Location: [place];  Service: Orthopedics;  Laterality: Left;   TUBAL LIGATION  2002     A IV Location/Drains/Wounds Patient Lines/Drains/Airways Status     Active Line/Drains/Airways     Name Placement date Placement time Site Days   Peripheral IV 09/29/22 20 G Anterior;Distal;Right;Upper Arm 09/29/22  1511  Arm  less than 1   Peripheral IV 09/29/22 20 G Posterior;Right Hand 09/29/22  2100  Hand  less than 1   Fistula / Graft Left  Forearm Arteriovenous fistula 04/14/19  1642  Forearm  1264   Fistula / Graft Left Upper arm --  --  Upper arm  --   Fistula / Graft Left Upper arm --  --  Upper arm  --   Negative Pressure Wound Therapy 04/18/21  1444  --  529   Negative Pressure Wound Therapy Leg Anterior;Right 05/20/21  0000  --  497            Intake/Output Last 24 hours No intake or output data in the 24 hours ending 09/29/22 2202  Labs/Imaging Results for orders placed or performed during the hospital encounter of 09/29/22 (from the past 48 hour(s))  CBC with Differential     Status: Abnormal   Collection Time: 09/29/22  1:10 PM  Result Value Ref Range   WBC 7.4 4.0 - 10.5 K/uL   RBC 2.12 (L) 3.87 - 5.11 MIL/uL   Hemoglobin 6.7 (LL) 12.0 - 15.0 g/dL    Comment: REPEATED  TO VERIFY THIS CRITICAL RESULT HAS VERIFIED AND BEEN CALLED TO [address],RN BY ZELDA BEECH ON 02 20 2024 AT 1447, AND HAS BEEN READ BACK.     HCT 20.7 (L) 36.0 - 46.0 %   MCV 97.6 80.0 - 100.0 fL   MCH 31.6 26.0 - 34.0 pg   MCHC 32.4 30.0 - 36.0 g/dL   RDW 17.1 (H) 11.5 - 15.5 %   Platelets 330 150 - 400 K/uL   nRBC 0.0 0.0 - 0.2 %   Neutrophils Relative % 77 %   Neutro Abs 5.7 1.7 - 7.7 K/uL   Lymphocytes Relative 13 %   Lymphs Abs 1.0 0.7 - 4.0 K/uL   Monocytes Relative 7 %   Monocytes Absolute 0.5 0.1 - 1.0 K/uL   Eosinophils Relative 2 %   Eosinophils Absolute 0.2 0.0 - 0.5 K/uL   Basophils Relative 1 %   Basophils Absolute 0.1 0.0 - 0.1 K/uL   Immature Granulocytes 0 %   Abs Immature Granulocytes 0.01 0.00 - 0.07 K/uL    Comment: Performed at [facility] [address] [place], [place] [id number]  Basic metabolic panel     Status: Abnormal   Collection Time: 09/29/22  1:10 PM  Result Value Ref Range   Sodium 135 135 - 145 mmol/L   Potassium 3.9 3.5 - 5.1 mmol/L   Chloride 94 (L) 98 - 111 mmol/L   CO2 25 22 - 32 mmol/L   Glucose, Bld 222 (H) 70 - 99 mg/dL    Comment: Glucose reference range applies only to samples taken after fasting for at least 8 hours.   BUN 30 (H) 6 - 20 mg/dL   Creatinine, Ser 4.53 (H) 0.44 - 1.00 mg/dL   Calcium 9.4 8.9 - 10.3 mg/dL   GFR, Estimated 12 (L) >60 mL/min    Comment: (NOTE) Calculated using the CKD-EPI Creatinine Equation (2021)    Anion gap 16 (H) 5 - 15    Comment: Performed at [facility] [address], [place], [place] [address]  Resp panel by RT-PCR (RSV, Flu A&B, Covid) Anterior Nasal Swab     Status: None   Collection Time: 09/29/22  1:23 PM   Specimen: Anterior Nasal Swab  Result Value Ref Range   SARS Coronavirus 2 by RT PCR NEGATIVE NEGATIVE   Influenza A by PCR NEGATIVE NEGATIVE   Influenza B by PCR NEGATIVE NEGATIVE    Comment: (NOTE) The Xpert Xpress SARS-CoV-2/FLU/RSV plus assay  is intended as an  aid in the diagnosis of influenza from Nasopharyngeal swab specimens and should not be used as a sole basis for treatment. Nasal washings and aspirates are unacceptable for Xpert Xpress SARS-CoV-2/FLU/RSV testing.  Fact Sheet for Patients: EntrepreneurPulse.com.au  Fact Sheet for Healthcare Providers: IncredibleEmployment.be  This test is not yet approved or cleared by the [place] FDA and has been authorized for detection and/or diagnosis of SARS-CoV-2 by FDA under an Emergency Use Authorization (EUA). This EUA will remain in effect (meaning this test can be used) for the duration of the COVID-19 declaration under Section 564(b)(1) of the Act, 21 U.S.C. section 360bbb-3(b)(1), unless the authorization is terminated or revoked.     Resp Syncytial Virus by PCR NEGATIVE NEGATIVE    Comment: (NOTE) Fact Sheet for Patients: EntrepreneurPulse.com.au  Fact Sheet for Healthcare Providers: IncredibleEmployment.be  This test is not yet approved or cleared by the [place] FDA and has been authorized for detection and/or diagnosis of SARS-CoV-2 by FDA under an Emergency Use Authorization (EUA). This EUA will remain in effect (meaning this test can be used) for the duration of the COVID-19 declaration under Section 564(b)(1) of the Act, 21 U.S.C. section 360bbb-3(b)(1), unless the authorization is terminated or revoked.  Performed at [facility], [place] [address] [place], [place] [address]   Type and screen [place]     Status: None (Preliminary result)   Collection Time: 09/29/22  4:26 PM  Result Value Ref Range   ABO/RH(D) B POS    Antibody Screen NEG    Sample Expiration      10/02/2022,2359 Performed at [facility], [place] [address], [place], [place] [address]    Unit Number [id number]    Blood Component Type RED CELLS,LR    Unit division 00    Status of Unit  ALLOCATED    Transfusion Status OK TO TRANSFUSE    Crossmatch Result Compatible   Prepare RBC (crossmatch)     Status: None   Collection Time: 09/29/22  7:30 PM  Result Value Ref Range   Order Confirmation      ORDER PROCESSED BY BLOOD BANK Performed at [facility], [place] [address] [place], [place] [address]    DG Chest 1 View  Result Date: 09/29/2022 CLINICAL DATA:  Shortness of breath.  Chest pain. EXAM: CHEST  1 VIEW COMPARISON:  09/27/2022 FINDINGS: Mild enlargement of the cardiopericardial silhouette. Mildly indistinct pulmonary vasculature suggesting pulmonary venous hypertension. Reduce conspicuity of the left hemidiaphragm in the retrocardiac region, raising suspicion for new retrocardiac airspace opacity in the left lower lobe. No blunting of the costophrenic angles. Atherosclerotic calcification of the aortic arch. IMPRESSION: 1. Mild enlargement of the cardiopericardial silhouette with pulmonary venous hypertension but no overt edema. 2. Reduce conspicuity of the left hemidiaphragm in the retrocardiac region, raising suspicion for new airspace opacity in the left lower lobe, potentially from pneumonia or atelectasis. Electronically Signed   By: Van Clines M.D.   On: 09/29/2022 16:35   DG Lumbar Spine Complete  Result Date: 09/29/2022 CLINICAL DATA:  Shortness of breath.  Dialysis. EXAM: LUMBAR SPINE - COMPLETE 4+ VIEW COMPARISON:  CT abdomen 12/31/2018 FINDINGS: Extensive vascular calcifications including the aortoiliac tree and renal arteries. Degenerative hip arthropathy bilaterally, right greater than left. Scattered stool in the colon.  No dilated bowel observed. No lumbar fracture or malalignment. Preserved intervertebral articular spaces. IMPRESSION: 1. No acute lumbar spine findings. 2. Extensive vascular calcifications. 3.  Degenerative hip arthropathy bilaterally, right greater than left. Electronically Signed   By: Van Clines M.D.   On: 09/29/2022 16:28     Pending Labs Unresulted Labs (From admission, onward)     Start     Ordered   09/30/22 [id number]  Basic metabolic panel  Tomorrow morning,   R        09/29/22 2105   09/30/22 0500  CBC  Tomorrow morning,   R        09/29/22 2105   09/29/22 2149  Hepatic function panel  Add-on,   AD        09/29/22 2148            Vitals/Pain Today's Vitals   09/29/22 2023 09/29/22 2043 09/29/22 2139 09/29/22 2201  BP: (!) 149/76     Pulse: 88  85   Resp: 19  10   Temp:    98 F (36.7 C)  TempSrc:      SpO2: 100%  100%   Weight:      Height:      PainSc:  7       Isolation Precautions Airborne and Contact precautions  Medications Medications  azithromycin (ZITHROMAX) 500 mg in sodium chloride 0.9 % 250 mL IVPB (500 mg Intravenous New Bag/Given 09/29/22 2138)  benzonatate (TESSALON) capsule 100 mg (100 mg Oral Not Given 09/29/22 2021)  0.9 %  sodium chloride infusion (Manually program via Guardrails IV Fluids) (has no administration in time range)  heparin injection 5,000 Units (has no administration in time range)  acetaminophen (TYLENOL) tablet 650 mg (has no administration in time range)    Or  acetaminophen (TYLENOL) suppository 650 mg (has no administration in time range)  senna-docusate (Senokot-S) tablet 1 tablet (has no administration in time range)  albuterol (PROVENTIL) (2.5 MG/3ML) 0.083% nebulizer solution 3 mL (has no administration in time range)  amLODipine-olmesartan (AZOR) 10-40 MG per tablet 1 tablet (has no administration in time range)  atorvastatin (LIPITOR) tablet 80 mg (has no administration in time range)  calcitRIOL (ROCALTROL) capsule 1.25 mcg (has no administration in time range)  calcium acetate (PHOSLO) capsule 1,334 mg (has no administration in time range)  cyclobenzaprine (FLEXERIL) tablet 5 mg (has no administration in time range)  aspirin EC tablet 81 mg (has no administration in time range)  insulin aspart (novoLOG) injection 0-6 Units (has no  administration in time range)  insulin aspart (novoLOG) injection 0-5 Units (has no administration in time range)  insulin glargine-yfgn (SEMGLEE) injection 15 Units (has no administration in time range)  bictegravir-emtricitabine-tenofovir AF (BIKTARVY) 50-200-25 MG per tablet 1 tablet (has no administration in time range)  HYDROmorphone (DILAUDID) injection 1 mg (1 mg Intravenous Given 09/29/22 1622)  ondansetron (ZOFRAN) injection 4 mg (4 mg Intravenous Given 09/29/22 2004)  cefTRIAXone (ROCEPHIN) 1 g in sodium chloride 0.9 % 100 mL IVPB (0 g Intravenous Stopped 09/29/22 2137)  HYDROmorphone (DILAUDID) injection 1 mg (1 mg Intravenous Given 09/29/22 2057)    Mobility power wheelchair     Focused Assessments Cardiac Assessment Handoff:    Lab Results  Component Value Date   CKTOTAL 64 07/03/2018   No results found for: "DDIMER" Does the Patient currently have chest pain? No    R Recommendations: See Admitting Provider Note  Report given to:   Additional Notes:   RN attempted IV for 2 times and had associate try 2 times. Patient is currently receiving antibiotics through current IV and IV team is consulted.

## 2022-09-29 NOTE — Hospital Course (Addendum)
-  CAP -SOB, cough, couple days -back pain overnight -on dialysis, done today  -blood transfusion on Friday  2/23: Patient is still nauseous and throwing up overnight. Patient is concerned that she may have eaten tuna that went bad. She last vomited this morning. Patient states that zofran helps her nausea. Has mild generalized abdominal pain. No diarrhea. Last had a bowel movement yesterday.     Dear Kathy Frank,  On behalf of the Internal Medicine team, it was a pleasure caring for you. You came to the hospital for shortness of breath and cough. While you were in the hospital we were concerned you had pneumonia at first. You were started on antibiotics and we monitored you closely. You also began to have very painful rib and back pain. Thankfully, the X-ray of your chest did not show any fractures. You have not had any fevers and your shortness of breath has improved. Because of all of this, we do not suspect you have pneumonia anymore and will not send you home on antibiotics. We believe you are well enough to continue recovering at home!  For your rib pain - It is very important you continue to practice taking deep breaths with the spirometer, this will keep your lungs healthy and active. This will also decrease the chances of fluid building up. - Place a lidocaine patch once a day on the areas that are sore. - Continue taking your flexeril for muscle spasms - If you begin to see blisters forming on your ribcage, please contact the clinic at [phone].  Please continue taking your other home medications as directed, we refilled your flexeril and blood pressure medications (Amlodipine-olmesartan).   You have the following upcoming appointments:  Monday, 10/19/2022 at 10:45 AM with Dr. [facility]  Friday, 10/02/2022 at 8:30 AM [place], Room 2  Tuesday, 10/06/2022 at 1:30 PM [facility] for Infectious Disease   We wish you a  speedy recovery and will see soon you at our clinic!  Sincerely,   Dr. Christene Slates, MD

## 2022-09-29 NOTE — ED Provider Triage Note (Signed)
Emergency Medicine Provider Triage Evaluation Note  Kathy Frank , a 47 y.o. female  was evaluated in triage.  Pt complains of back pain and cough.  Patient states that her cough started yesterday.  She is complaining of severe pain in her lower back.  She is also feeling more short of breath because she is not getting her full fluid weight taken off at dialysis.  She went today and had a full session but states that they have not taken off her full kilos for the past 2 visits.  She denies urinary symptoms..  Review of Systems  Positive: Cough and back pain Negative: Fever  Physical Exam  BP (!) 191/81 (BP Location: Right Arm)   Pulse 94   Temp 98.4 F (36.9 C) (Oral)   Resp 18   Ht 5' 5"$  (1.651 m)   Wt 75.3 kg   SpO2 100%   BMI 27.62 kg/m  Gen:   Awake, no distress   Resp:  Normal effort  MSK:   Moves extremities without difficulty  Other:  No midline spinal tenderness  Medical Decision Making  Medically screening exam initiated at 1:22 PM.  Appropriate orders placed.  [name] was informed that the remainder of the evaluation will be completed by another provider, this initial triage assessment does not replace that evaluation, and the importance of remaining in the ED until their evaluation is complete.     Margarita Mail, PA-C 09/29/22 1323

## 2022-09-29 NOTE — ED Notes (Signed)
Patient gone to Xray

## 2022-09-29 NOTE — Progress Notes (Unsigned)
DATE: 09/08/2022  TIME: 12PM  NAME: [place]: Psychotherapy  DIAGNOSIS: MDD, GAD  OBJECTIVE:   Mood: Euthymic and Affect: Appropriate  Risk of harm to self or others: No plan to harm self or others    GOALS ADDRESSED:  Patient will: 1. Reduce symptoms of: mood instability  2. Decrease symptoms of Anxiety/Depression 3. Increase knowledge and/or ability of: coping skills  4. Demonstrate ability to: Increase healthy adjustment to current life circumstances   INTERVENTIONS: 1st SESSION: Therapist met with client as scheduled to continue rapport building. Therapist introduced and shared some information about herself and encouraged the same from client in order to build trust and openness within the counseling relationship. Therapist discussed and reviewed treatment plans with client based on the result from client's assessment. Therapist discussed client current mental health symptoms /diagnosis of DSM-5, as well as recommendations and target population for various eligibility services. Therapist assessed for SI/HI during session and will follow-up with client during the next session.  EFFECTIVENESS/PLAN:  1st SESSION:  Client attended (GROUP) session with therapist as scheduled and presented alert; orient X4. On start of session, client affect appeared sullen; affect was congruent with client report. Appearance was relaxed/casual, and attitude was appropriate. Client was receptive to rapport building by sharing information about him/herself, such as: expressing likes, dislikes, and strengths. Client was able to make connections between consequences, challenges and alternative thoughts of mental health recovery. Client presented mixed moods during session and talked about her anxiety in regards to her health and the attitude of her daughter towards her mental and physical health. Client legs have been removed. However, client reports there is only one other person who knows her HIV  status. Client and clinician processed the benefits of confidentiality in regards to her diagnosis. Client progress toward therapeutic goal(s) are minimal at this time. Client denied SI/HI.

## 2022-09-29 NOTE — ED Notes (Signed)
IM at bedside; second IV placed; first line in [facility] positional, antibiotics slowly infusing

## 2022-09-29 NOTE — H&P (Signed)
Date: 09/29/2022               Patient Name:  Kathy Frank MRN: [id number]  DOB: 11-Jun-1976 Age / Sex: 47 y.o., female   PCP: [name], MD         Medical Service: Internal Medicine Teaching Service         Attending Physician: Dr. Jimmye Norman, Elaina Pattee, MD      First Contact: Dr. Christene Slates MD Pager [phone]    Second Contact: Dr. Delene Ruffini, MD Pager [phone]         After Hours (After 5p/  First Contact Pager: [phone]  weekends / holidays): Second Contact Pager: [phone]   SUBJECTIVE   Chief Complaint: pain and shortness of breath  History of Present Illness: Kathy Frank is a 48 yo female with ESRD on HD TThS, type 2 diabetes c/b diabetic retinopathy, s/p left AKA and right BKA, HIV, hypertension, hyperlipidemia, PAD, and secondary hyperparathyroidism of renal origin who presented with shortness of breath and back pain.  The patient states that her back pain started last night and is more severe than any pain she has had before. She denies any recent trauma or injuries and also denies any numbness/tingling in her lower extremities. She just woke up with this pain and it is located in the middle back and is very severe. The pain does not radiate anywhere, but the patient does have some pain in her rib cage when she coughs/feels short of breath.   Additionally, the patient went to dialysis today, and also felt like they did not take off enough fluid, as she feels short of breath right now. She finished her treatment, but states that she still has an extra 5 kg on her. Her stumps feel more swollen than usual, but they are not painful. The patient also has had a cough for a few days and feels short of breath. She has not missed any dialysis sessions. The patient called the [facility] and was advised to come into the ED. Also, the patient was told her blood counts were low, so she was scheduled for an outpatient blood transfusion on Friday 2/23. She has not noticed any  bleeding from anywhere.  The patient denies fevers, but does endorse chills and myalgias. She did start to feel nauseous today and had one episode of vomiting in the ED. Denies any recent sick contacts and any recent medication changes.  She does still make urine, but denies any urinary symptoms. Last BM was here in the ED and was normal.   ED Course:  CBC with Hb of 6.7, no leukocytosis. Electrolytes within normal limits on BMP. Resp panel negative. CXR with airspace opacity in the left lower lobe.   Past Medical History ESRD on HD TThS  Type 2 diabetes c/b diabetic retinopathy S/p left AKA and right BKA  HIV Hypertension  Hyperlipidemia PAD Secondary hyperparathyroidism of renal origin  Aortic atherosclerosis GERD Depression Normocytic anemia Gastroparesis   Meds:  Albuterol  Amlodipine-olmesartan 10-40 qhs Atorvastatin 80 Biktarvy  Calcitriol 1.25 mcg dialysis days  Calcium acetate (unsure) Flexeril bid prn  Aranesp once a week or every two weeks NO aspirin Lantus 15u qhs Victoza 0.11m daily Lokelma 5 g daily  Current Meds  Medication Sig   albuterol (VENTOLIN HFA) 108 (90 Base) MCG/ACT inhaler Inhale 2 puffs into the lungs every 4 (four) hours as needed for wheezing or shortness of breath.   amLODipine-olmesartan (AZOR) 10-40 MG tablet Take 1 tablet  by mouth every evening.   bictegravir-emtricitabine-tenofovir AF (BIKTARVY) 50-200-25 MG TABS tablet Take 1 tablet by mouth daily. OVERDUE FOR OFFICE FOLLOW UP - PLEASE CALL OUR OFFICE TO SCHEDULE [phone]   calcitRIOL (ROCALTROL) 0.25 MCG capsule Take 5 capsules (1.25 mcg total) by mouth Every Tuesday,Thursday,and Saturday with dialysis.   cyclobenzaprine (FLEXERIL) 5 MG tablet Take 5 mg by mouth 2 (two) times daily as needed for muscle spasms.   Darbepoetin Alfa (ARANESP) 200 MCG/0.4ML SOSY injection Inject 0.4 mLs (200 mcg total) into the skin every Monday at 6 PM.   gabapentin (NEURONTIN) 100 MG capsule Take 100 mg  by mouth at bedtime.   insulin glargine (LANTUS SOLOSTAR) 100 UNIT/ML Solostar Pen Inject 15 Units into the skin at bedtime.   liraglutide (VICTOZA) 18 MG/3ML SOPN Inject 0.6 mg into the skin daily.   LOKELMA 5 g packet Take 5 g by mouth daily.   multivitamin (RENA-VIT) TABS tablet Take 1 tablet by mouth daily. (Patient taking differently: Take 1 tablet by mouth See admin instructions. On Tuesday, Wednesday and Thursday)   polyethylene glycol (MIRALAX / GLYCOLAX) 17 g packet Take 17 g by mouth daily. (Patient taking differently: Take 17 g by mouth daily as needed for moderate constipation or mild constipation.)    Past Surgical History Past Surgical History:  Procedure Laterality Date   ABDOMINAL AORTOGRAM W/LOWER EXTREMITY N/A 12/13/2020   Procedure: ABDOMINAL AORTOGRAM W/LOWER EXTREMITY;  Surgeon: Angelia Mould, MD;  Location: [place] [facility];  Service: Cardiovascular;  Laterality: N/A;   AMPUTATION Left 07/08/2018   Procedure: AMPUTATION FORTH RAY LEFT FOOT;  Surgeon: [name], MD;  Location: [place];  Service: Orthopedics;  Laterality: Left;   AMPUTATION Left 08/09/2018   Procedure: Left Transmetatarsal Amputation;  Surgeon: [name], MD;  Location: [place];  Service: Orthopedics;  Laterality: Left;   AMPUTATION Left 10/08/2018   Procedure: LEFT BELOW KNEE AMPUTATION;  Surgeon: [name], MD;  Location: [place];  Service: Orthopedics;  Laterality: Left;   AMPUTATION Left 10/28/2018   Procedure: REVISION BELOW KNEE AMPUTATION;  Surgeon: [name], MD;  Location: [place];  Service: Orthopedics;  Laterality: Left;   AMPUTATION Left 12/16/2018   Procedure: LEFT ABOVE KNEE AMPUTATION;  Surgeon: [name], MD;  Location: [place];  Service: Orthopedics;  Laterality: Left;   AMPUTATION Right 01/31/2021   Procedure: AMPUTATION BELOW KNEE;  Surgeon: Serafina Mitchell, MD;  Location: [facility];  Service: Vascular;  Laterality: Right;   AMPUTATION Right 03/28/2021   Procedure:  REVISION OF RIGHT BELOW KNEE AMPUTATION;  Surgeon: Serafina Mitchell, MD;  Location: [place];  Service: Vascular;  Laterality: Right;   AMPUTATION Right 04/18/2021   Procedure: right below knee amputation/washout placement wound vac;  Surgeon: Cherre Robins, MD;  Location: [place];  Service: Vascular;  Laterality: Right;   AV FISTULA PLACEMENT Left 10/14/2018   Procedure: Arteriovenous (Av) Fistula Creation Left Arm;  Surgeon: [name], MD;  Location: [place];  Service: Vascular;  Laterality: Left;   [place] Left 04/14/2019   Procedure: BASILIC VEIN TRANSPOSITION SECOND STAGE LEFT ARM;  Surgeon: Rosetta Posner, MD;  Location: [place];  Service: Vascular;  Laterality: Left;   CORONARY STENT INTERVENTION N/A 05/21/2021   Procedure: CORONARY STENT INTERVENTION;  Surgeon: Leonie Man, MD;  Location: [place] [facility];  Service: Cardiovascular;  Laterality: N/A;   INCISION AND DRAINAGE ABSCESS N/A 10/24/2020   Procedure: exicision of  hydradenitis;  Surgeon: Leighton Ruff, MD;  Location: [facility];  Service: General;  Laterality: N/A;  45 min   INSERTION OF DIALYSIS CATHETER Right 04/14/2019   Procedure: INSERTION OF DIALYSIS CATHETER;  Surgeon: Rosetta Posner, MD;  Location: [facility];  Service: Vascular;  Laterality: Right;   LEFT HEART CATH AND CORONARY ANGIOGRAPHY N/A 05/21/2021   Procedure: LEFT HEART CATH AND CORONARY ANGIOGRAPHY;  Surgeon: Leonie Man, MD;  Location: [place] [facility];  Service: Cardiovascular;  Laterality: N/A;   LOWER EXTREMITY ANGIOGRAPHY N/A 07/05/2018   Procedure: LOWER EXTREMITY ANGIOGRAPHY;  Surgeon: Serafina Mitchell, MD;  Location: [place] [facility];  Service: Cardiovascular;  Laterality: N/A;   PERIPHERAL VASCULAR ATHERECTOMY Right 12/13/2020   Procedure: PERIPHERAL VASCULAR ATHERECTOMY;  Surgeon: Angelia Mould, MD;  Location: [place] [facility];  Service: Cardiovascular;  Laterality: Right;  Superficial femoral    PERIPHERAL VASCULAR BALLOON ANGIOPLASTY Left 07/05/2018   Procedure: PERIPHERAL VASCULAR BALLOON ANGIOPLASTY;  Surgeon: Serafina Mitchell, MD;  Location: [place] [facility];  Service: Cardiovascular;  Laterality: Left;  SFA   PERIPHERAL VASCULAR BALLOON ANGIOPLASTY Right 12/13/2020   Procedure: PERIPHERAL VASCULAR BALLOON ANGIOPLASTY;  Surgeon: Angelia Mould, MD;  Location: [place] [facility];  Service: Cardiovascular;  Laterality: Right;  Peroneal artery, anterior tibial artery   RECTAL EXAM UNDER ANESTHESIA N/A 10/24/2020   Procedure: EXAM UNDER ANESTHESIA;  Surgeon: Leighton Ruff, MD;  Location: [facility];  Service: General;  Laterality: N/A;   STUMP REVISION Left 10/19/2018   Procedure: REVISION LEFT BELOW KNEE AMPUTATION;  Surgeon: [name], MD;  Location: [place];  Service: Orthopedics;  Laterality: Left;   TUBAL LIGATION  2002   Social:  Lives With: 2 daughters in [place] Support: family  Level of Function: independent of ADLs; uses wheelchair PCP: Dr. Lisabeth Devoid, IMTS Substances: quit smoking in 2016 (<1 pack/day since age 52); no alcohol; does smoke marijuana a couple times a week   Family History: multiple family members with diabetes  Allergies: Allergies as of 09/29/2022   (No Known Allergies)    Review of Systems: A complete ROS was negative except as per HPI.   OBJECTIVE:   Physical Exam: Blood pressure (!) 168/75, pulse 88, temperature 97.9 F (36.6 C), temperature source Oral, resp. rate 16, height 5' 5"$  (1.651 m), weight 75.3 kg, SpO2 100 % on room air.  Constitutional: chronically ill-appearing female sitting in bed, in no acute distress Cardiovascular: regular rate and rhythm, grade 3/6 systolic murmur best heard at RUSB Pulmonary/Chest: normal work of breathing on room air, able to speak in full sentences without accessory muscle use, diminished breath sounds in left lower lung field Abdominal: soft, non-tender, non-distended MSK: s/p R  BKA and L AKA; tenderness to palpation of T5-7 paraspinal region without midline tenderness or deformity Neurological: alert & oriented x 3 Skin: warm and dry, normal skin turgor Psych: Normal mood and affect  Labs: CBC    Component Value Date/Time   WBC 7.4 09/29/2022 1310   RBC 2.12 (L) 09/29/2022 1310   HGB 6.7 (LL) 09/29/2022 1310   HGB 9.8 (L) 07/18/2018 1133   HCT 20.7 (L) 09/29/2022 1310   HCT 19.3 (L) 10/09/2018 1405   PLT 330 09/29/2022 1310   PLT 417 07/18/2018 1133   MCV 97.6 09/29/2022 1310   MCV 89 07/18/2018 1133   MCH 31.6 09/29/2022 1310   MCHC 32.4 09/29/2022 1310   RDW 17.1 (H) 09/29/2022 1310  RDW 12.9 07/18/2018 1133   LYMPHSABS 1.0 09/29/2022 1310   LYMPHSABS 1.6 12/23/2017 1558   MONOABS 0.5 09/29/2022 1310   EOSABS 0.2 09/29/2022 1310   EOSABS 0.2 12/23/2017 1558   BASOSABS 0.1 09/29/2022 1310   BASOSABS 0.1 12/23/2017 1558     CMP     Component Value Date/Time   NA 135 09/29/2022 1310   NA 138 12/23/2017 1558   K 3.9 09/29/2022 1310   CL 94 (L) 09/29/2022 1310   CO2 25 09/29/2022 1310   GLUCOSE 222 (H) 09/29/2022 1310   BUN 30 (H) 09/29/2022 1310   BUN 28 (H) 12/23/2017 1558   CREATININE 4.53 (H) 09/29/2022 1310   CREATININE 12.07 (H) 05/29/2020 1155   CALCIUM 9.4 09/29/2022 1310   CALCIUM 8.5 (L) 11/25/2018 0309   PROT 7.9 09/29/2022 1310   PROT 7.3 04/23/2016 1535   ALBUMIN 2.9 (L) 09/29/2022 1310   ALBUMIN 3.4 (L) 04/23/2016 1535   AST 12 (L) 09/29/2022 1310   ALT 9 09/29/2022 1310   ALKPHOS 45 09/29/2022 1310   BILITOT <0.1 (L) 09/29/2022 1310   BILITOT 0.2 04/23/2016 1535   GFRNONAA 12 (L) 09/29/2022 1310   GFRNONAA 79 11/14/2014 1154   GFRAA 5 (L) 01/06/2020 1329   GFRAA >89 11/14/2014 1154    Imaging: CXR: airspace opacity in LLL  EKG: personally reviewed my interpretation is normal sinus rhythm. Compared to prior EKG with no significant changes.   ASSESSMENT & PLAN:   Assessment & Plan by Problem: Principal  Problem:   CAP (community acquired pneumonia)   Kathy Frank is a 47 y.o. person living with a history of ESRD on HD TThS, type 2 diabetes c/b diabetic retinopathy, s/p left AKA and right BKA, HIV, hypertension, hyperlipidemia, PAD, and secondary hyperparathyroidism who presented with SOB and back pain and admitted for CAP on hospital day 0  Community acquired pneumonia Patient presented with shortness of breath, cough, chills, and myalgias, but no fevers. CXR with LLL opacity, concerning for pneumonia. The patient's vital signs are all stable and she is afebrile. CBC without any leukocytosis. Respiratory panel negative for COVID of influenza. Received dose of rocephin and azithromycin in ED, will continue CAP coverage for 5 days.  -continue Rocephin and Azithromycin  -Albuterol q4h PRN  -Mucinex BID PRN  Thoracic back pain Acute back pain without any red flag symptoms, recent trauma or injury. Pain exacerbated with deep breathing and cough. Lumbar xray with no acute findings, but did show extensive vascular calcifications and degenerative hip arthropathy bilaterally. She received one dose of IV dilaudid 1 mg in the ED. Has flexeril at home for leg spasms.  -lidocaine patch -home flexeril 5 mg BID PRN -tylenol 650 mg q6h PRN   ESRD on HD TThS Patient has been on dialysis for a few years (since 2020), but notes that she does still make urine. Last HD session was today and she has not missed any recently. Nephro was consulted from the ED for inpatient dialysis needs, should she still be admitted on Thursday. Electrolytes were checked and are within normal limits.  -HD per nephro, appreciate assistance  -continue home calcium 1334 mg tid with meals -continue home calcitriol 1.25 mcg on HD days -monitor renal function   Chronic normocytic anemia Patient has chronic anemia likely secondary to her renal disease. She was told her Hb was low as an outpatient and was scheduled for a transfusion  on 2/23. Hb in the ED was 6.7, 1u PRBC was  ordered. Of note, patient gets arenesp injections every 1-2 weeks.  -f/u post-transfusion H&H -trend CBC -monitor for any signs of bleeding   Hypertension Home medication include amlodipine-olmesartan 10-40 mg daily. Last took this medicine yesterday, as she usually takes it in the evenings. BP has been elevated in the ED, with SBP in the 140-160s. -resume amlodipine 10 mg  -start irbesartan 300 mg daily (olmesartan not on formulary)  Type 2 diabetes Last A1c 6.3% in November - could be falseley low ESRD on HD. Home medications include Lantus 15u qhs and Victoza 0.6 mg daily. -continue home lantus 15u qhs  -on SSI -CBG ACHS  HIV Patient follows with Dr. Megan Salon yearly and her HIV has been undetectable. -continue home Biktarvy   Diet: Renal VTE: Heparin IVF: None,None Code: Full  Prior to Admission Living Arrangement: Home, living daughters Anticipated Discharge Location:  TBD Barriers to Discharge: Medical stability and workup  Dispo: Admit patient to Observation with expected length of stay less than 2 midnights.  Signed: Angelique Blonder, DO Internal Medicine Resident PGY-1 09/29/2022, 11:21 PM   Please contact on call pager, weekdays after 5pm and weekends after 1pm, at [phone].

## 2022-09-29 NOTE — ED Provider Notes (Signed)
[facility] Provider Note   CSN: [id number] Arrival date & time: 09/29/22  1251     History  Chief Complaint  Patient presents with   Back Pain    Kathy Frank is a 47 y.o. female with a past medical history of type 2 diabetes, HIV and ESRD on TTS dialysis presenting today due to shortness of breath and back pain.  She reports that the back pain started overnight and is more severe than any pain that she has ever had.  No known trauma.  No numbness and tingling in the lower extremities.  She says that she completed dialysis but she does not believe they are taking off enough fluid.  Also endorses some chills and myalgias.  No history of PE, is not anticoagulated, complains of bilateral leg pain but no swelling, no smoking, no estrogen, recent travel or surgery.  She also tells me that she is scheduled for blood transfusion on Friday.   Back Pain Associated symptoms: no fever        Home Medications Prior to Admission medications   Medication Sig Start Date End Date Taking? Authorizing Provider  Accu-Chek Softclix Lancets lancets Use as instructed 05/02/21   Atway, Rayann N, DO  albuterol (VENTOLIN HFA) 108 (90 Base) MCG/ACT inhaler Inhale 2 puffs into the lungs every 4 (four) hours as needed for wheezing or shortness of breath. 02/14/21   Angiulli, Lavon Paganini, PA-C  amLODipine (NORVASC) 10 MG tablet Take 1 tablet (10 mg total) by mouth daily. Patient not taking: Reported on 08/26/2022 05/15/21   [name], MD  amLODipine-olmesartan (AZOR) 10-40 MG tablet Take by mouth. 07/16/22   [provider]  amoxicillin-clavulanate (AUGMENTIN) 500-125 MG tablet Take 1 tablet by mouth at bedtime. Patient not taking: Reported on 08/26/2022 06/26/22   Virl Axe, MD  atorvastatin (LIPITOR) 80 MG tablet Take 1 tablet (80 mg total) by mouth daily. Patient not taking: Reported on 06/28/2022 05/24/21   Masters, Joellen Jersey, DO   bictegravir-emtricitabine-tenofovir AF (BIKTARVY) 50-200-25 MG TABS tablet Take 1 tablet by mouth daily. OVERDUE FOR OFFICE FOLLOW UP - PLEASE CALL OUR OFFICE TO SCHEDULE [phone] 08/26/22   Michel Bickers, MD  calcitRIOL (ROCALTROL) 0.25 MCG capsule Take 5 capsules (1.25 mcg total) by mouth Every Tuesday,Thursday,and Saturday with dialysis. 07/02/22   Rick Duff, MD  calcium acetate (PHOSLO) 667 MG capsule Take 2 capsules (1,334 mg total) by mouth 3 (three) times daily with meals. 06/30/22   Rick Duff, MD  Continuous Blood Gluc Receiver (DEXCOM G7 RECEIVER) DEVI Use to check blood sugars cntinuously 06/30/22   Rick Duff, MD  Continuous Blood Gluc Sensor (DEXCOM G7 SENSOR) MISC Use to check blood sugar continuously 06/30/22   Rick Duff, MD  cyclobenzaprine (FLEXERIL) 5 MG tablet Take 5 mg by mouth 2 (two) times daily as needed for muscle spasms. 05/21/22   [provider]  Darbepoetin Alfa (ARANESP) 200 MCG/0.4ML SOSY injection Inject 0.4 mLs (200 mcg total) into the skin every Monday at 6 PM. 07/06/22   Rick Duff, MD  EQ ASPIRIN ADULT LOW DOSE 81 MG tablet TAKE 1 TABLET BY MOUTH ONCE DAILY. SWALLOW WHOLE. 02/06/22   Waynetta Sandy, MD  glucose blood test strip Use as instructed 05/02/21   Atway, Rayann N, DO  insulin glargine (LANTUS SOLOSTAR) 100 UNIT/ML Solostar Pen Inject 15 Units into the skin at bedtime. 06/30/22   Rick Duff, MD  Insulin Pen Needle (PENTIPS) 32G X 4 MM MISC  Use as directed 02/14/21   Meredith Staggers, MD  liraglutide (VICTOZA) 18 MG/3ML SOPN Inject 0.6 mg into the skin daily. 06/30/22 06/25/23  Rick Duff, MD  [facility] 5 g packet Take by mouth. 07/30/22   [provider]  multivitamin (RENA-VIT) TABS tablet Take 1 tablet by mouth daily. Patient taking differently: Take 1 tablet by mouth See admin instructions. On Tuesday, Wednesday and Thursday 02/14/21   Angiulli, Lavon Paganini, PA-C  polyethylene  glycol (MIRALAX / GLYCOLAX) 17 g packet Take 17 g by mouth daily. 07/01/22   Rick Duff, MD      Allergies    Patient has no known allergies.    Review of Systems   Review of Systems  Constitutional:  Negative for chills and fever.  Respiratory:  Positive for cough and shortness of breath.   Musculoskeletal:  Positive for back pain and myalgias.    Physical Exam Updated Vital Signs BP (!) 194/82 (BP Location: Right Arm)   Pulse 89 Comment: Simultaneous filing. User may not have seen previous data.  Temp 98.4 F (36.9 C) (Oral)   Resp 20   Ht 5' 5"$  (1.651 m)   Wt 75.3 kg   SpO2 100% Comment: Simultaneous filing. User may not have seen previous data.  BMI 27.62 kg/m  Physical Exam Vitals and nursing note reviewed.  Constitutional:      General: She is not in acute distress.    Appearance: Normal appearance. She is not ill-appearing.  HENT:     Head: Normocephalic and atraumatic.     Mouth/Throat:     Mouth: Mucous membranes are moist.     Pharynx: Oropharynx is clear.  Eyes:     General: No scleral icterus.    Conjunctiva/sclera: Conjunctivae normal.  Cardiovascular:     Rate and Rhythm: Normal rate and regular rhythm.  Pulmonary:     Effort: Pulmonary effort is normal. No respiratory distress.     Breath sounds: Rales present. No wheezing.     Comments: Fine crackles in left lower lung field Musculoskeletal:     Right lower leg: No edema.     Left lower leg: No edema.     Comments: Full range of motion of all levels of the spine.  Reproducible tenderness over the paraspinal muscles of the thoracic spine.  Skin:    General: Skin is warm and dry.     Findings: No rash.  Neurological:     Mental Status: She is alert.  Psychiatric:        Mood and Affect: Mood normal.     ED Results / Procedures / Treatments   Labs (all labs ordered are listed, but only abnormal results are displayed) Labs Reviewed  CBC WITH DIFFERENTIAL/PLATELET - Abnormal; Notable for  the following components:      Result Value   RBC 2.12 (*)    Hemoglobin 6.7 (*)    HCT 20.7 (*)    RDW 17.1 (*)    All other components within normal limits  BASIC METABOLIC PANEL - Abnormal; Notable for the following components:   Chloride 94 (*)    Glucose, Bld 222 (*)    BUN 30 (*)    Creatinine, Ser 4.53 (*)    GFR, Estimated 12 (*)    Anion gap 16 (*)    All other components within normal limits  RESP PANEL BY RT-PCR (RSV, FLU A&B, COVID)  RVPGX2  TYPE AND SCREEN    EKG None  Radiology DG Chest  1 View  Result Date: 09/27/2022 CLINICAL DATA:  47 year old female with history of shortness of breath. EXAM: CHEST  1 VIEW COMPARISON:  Chest x-ray 09/15/2022. FINDINGS: Lung volumes are normal. No consolidative airspace disease. No pleural effusions. No pneumothorax. No pulmonary nodule or mass noted. Pulmonary vasculature and the cardiomediastinal silhouette are within normal limits. Atherosclerosis in the thoracic aorta. IMPRESSION: 1. No radiographic evidence of acute cardiopulmonary disease. 2. Aortic atherosclerosis. Electronically Signed   By: Vinnie Langton M.D.   On: 09/27/2022 18:54    Procedures .Critical Care  Performed by: Rhae Hammock, PA-C Authorized by: Rhae Hammock, PA-C   Critical care provider statement:    Critical care time (minutes):  30   Critical care was necessary to treat or prevent imminent or life-threatening deterioration of the following conditions:  Circulatory failure   Critical care was time spent personally by me on the following activities:  Development of treatment plan with patient or surrogate, discussions with consultants, evaluation of patient's response to treatment, obtaining history from patient or surrogate, ordering and performing treatments and interventions, ordering and review of laboratory studies, ordering and review of radiographic studies, pulse oximetry, re-evaluation of patient's condition and review of old charts    I assumed direction of critical care for this patient from another provider in my specialty: no     Care discussed with: admitting provider      Medications Ordered in ED Medications  HYDROmorphone (DILAUDID) injection 1 mg (has no administration in time range)    ED Course/ Medical Decision Making/ A&P                             Medical Decision Making Amount and/or Complexity of Data Reviewed Labs: ordered. Radiology: ordered.  Risk Prescription drug management. Decision regarding hospitalization.   47 year old patient presenting with back pain and shortness of breath.  Differential includes but is not limited to PE, ACS, pulmonary effusion, pulmonary edema, pneumothorax, hemothorax costochondritis.  This is not an exhaustive differential.    Past Medical History / Co-morbidities / Social History: ESRD on stay, Thursday Saturday dialysis   Physical Exam: Pertinent physical exam findings include Fine crackles in bilat lower lung fields  Lab Tests: I ordered, and personally interpreted labs.  The pertinent results include: Kidney function baseline Hemoglobin 6.7, below baseline   Imaging Studies: I ordered and independently visualized and interpreted chest x-ray and I agree with the radiologist that patient has a likely left lower lobe pneumonia   Cardiac Monitoring:  The patient was maintained on a cardiac monitor.  I viewed and interpreted the cardiac monitored which showed an underlying rhythm of: Sinus   Medications: I ordered medication including Dilaudid. Reevaluation of the patient after these medicines showed that the patient improved.   Antibiotics ordered for HCAP  Tessalon for cough  1 unit PRBC   Consultations Obtained: I spoke with Dr. Hollie Salk with nephrology and they will add the patient to the dialysis list.  I spoke with Roderic Palau with pharmacy to dose patient's HCAP antibiotics  MDM/Disposition: This is a 47 year old female who presented  today with back pain, cough and shortness of breath.  She is a dialysis patient and has not missed any sessions.  Her x-ray shows a questionable pneumonia.  She has been complaining of the shortness of breath and cough and I believe is consistent with pneumonia.  Also complained of some chills and myalgias.  I did consider  PE in this patient however this is lower my differential.  D-dimer was considered however not ordered because I expect it to be elevated.  She does not have any red flags of her back pain and I suspect it is musculoskeletal.  Reproducible.  At this time I believe she meets admission criteria due to high risk pneumonia and need for blood transfusion.  She is on the dialysis list.  IM accepts the patient for admission.  Final Clinical Impression(s) / ED Diagnoses Final diagnoses:  Pneumonia of left lower lobe due to infectious organism  Anemia due to chronic kidney disease, on chronic dialysis [facility])    Rx / DC Orders ED Discharge Orders     None      Admit to internal medicine teaching service   Rhae Hammock, PA-C 09/29/22 1903    Tegeler, Gwenyth Allegra, MD 09/29/22 [phone]

## 2022-09-30 ENCOUNTER — Other Ambulatory Visit (HOSPITAL_COMMUNITY): Payer: Self-pay

## 2022-09-30 DIAGNOSIS — R0789 Other chest pain: Secondary | ICD-10-CM | POA: Diagnosis not present

## 2022-09-30 DIAGNOSIS — M546 Pain in thoracic spine: Secondary | ICD-10-CM | POA: Diagnosis not present

## 2022-09-30 DIAGNOSIS — Z1152 Encounter for screening for COVID-19: Secondary | ICD-10-CM | POA: Diagnosis not present

## 2022-09-30 DIAGNOSIS — R911 Solitary pulmonary nodule: Secondary | ICD-10-CM | POA: Diagnosis not present

## 2022-09-30 DIAGNOSIS — J9811 Atelectasis: Secondary | ICD-10-CM | POA: Diagnosis not present

## 2022-09-30 DIAGNOSIS — R0602 Shortness of breath: Secondary | ICD-10-CM | POA: Diagnosis not present

## 2022-09-30 DIAGNOSIS — K219 Gastro-esophageal reflux disease without esophagitis: Secondary | ICD-10-CM | POA: Diagnosis present

## 2022-09-30 DIAGNOSIS — M898X9 Other specified disorders of bone, unspecified site: Secondary | ICD-10-CM | POA: Diagnosis present

## 2022-09-30 DIAGNOSIS — N186 End stage renal disease: Secondary | ICD-10-CM | POA: Diagnosis not present

## 2022-09-30 DIAGNOSIS — Z794 Long term (current) use of insulin: Secondary | ICD-10-CM | POA: Diagnosis not present

## 2022-09-30 DIAGNOSIS — E785 Hyperlipidemia, unspecified: Secondary | ICD-10-CM | POA: Diagnosis present

## 2022-09-30 DIAGNOSIS — E1143 Type 2 diabetes mellitus with diabetic autonomic (poly)neuropathy: Secondary | ICD-10-CM | POA: Diagnosis present

## 2022-09-30 DIAGNOSIS — F32A Depression, unspecified: Secondary | ICD-10-CM | POA: Diagnosis present

## 2022-09-30 DIAGNOSIS — J189 Pneumonia, unspecified organism: Secondary | ICD-10-CM | POA: Diagnosis not present

## 2022-09-30 DIAGNOSIS — E669 Obesity, unspecified: Secondary | ICD-10-CM | POA: Diagnosis present

## 2022-09-30 DIAGNOSIS — I4901 Ventricular fibrillation: Secondary | ICD-10-CM | POA: Diagnosis not present

## 2022-09-30 DIAGNOSIS — D631 Anemia in chronic kidney disease: Secondary | ICD-10-CM | POA: Diagnosis not present

## 2022-09-30 DIAGNOSIS — I272 Pulmonary hypertension, unspecified: Secondary | ICD-10-CM | POA: Diagnosis present

## 2022-09-30 DIAGNOSIS — B20 Human immunodeficiency virus [HIV] disease: Secondary | ICD-10-CM | POA: Diagnosis present

## 2022-09-30 DIAGNOSIS — I120 Hypertensive chronic kidney disease with stage 5 chronic kidney disease or end stage renal disease: Secondary | ICD-10-CM | POA: Diagnosis not present

## 2022-09-30 DIAGNOSIS — N2581 Secondary hyperparathyroidism of renal origin: Secondary | ICD-10-CM | POA: Diagnosis present

## 2022-09-30 DIAGNOSIS — Z87891 Personal history of nicotine dependence: Secondary | ICD-10-CM | POA: Diagnosis not present

## 2022-09-30 DIAGNOSIS — Z89511 Acquired absence of right leg below knee: Secondary | ICD-10-CM | POA: Diagnosis not present

## 2022-09-30 DIAGNOSIS — E1151 Type 2 diabetes mellitus with diabetic peripheral angiopathy without gangrene: Secondary | ICD-10-CM | POA: Diagnosis present

## 2022-09-30 DIAGNOSIS — E8779 Other fluid overload: Secondary | ICD-10-CM | POA: Diagnosis not present

## 2022-09-30 DIAGNOSIS — I442 Atrioventricular block, complete: Secondary | ICD-10-CM | POA: Diagnosis not present

## 2022-09-30 DIAGNOSIS — Z992 Dependence on renal dialysis: Secondary | ICD-10-CM | POA: Diagnosis not present

## 2022-09-30 DIAGNOSIS — I700 Atherosclerosis of aorta: Secondary | ICD-10-CM | POA: Diagnosis present

## 2022-09-30 DIAGNOSIS — Z89612 Acquired absence of left leg above knee: Secondary | ICD-10-CM | POA: Diagnosis not present

## 2022-09-30 DIAGNOSIS — E1122 Type 2 diabetes mellitus with diabetic chronic kidney disease: Secondary | ICD-10-CM | POA: Diagnosis present

## 2022-09-30 DIAGNOSIS — E113393 Type 2 diabetes mellitus with moderate nonproliferative diabetic retinopathy without macular edema, bilateral: Secondary | ICD-10-CM | POA: Diagnosis present

## 2022-09-30 LAB — TYPE AND SCREEN
ABO/RH(D): B POS
Antibody Screen: NEGATIVE
Unit division: 0

## 2022-09-30 LAB — RENAL FUNCTION PANEL
Albumin: 2.8 g/dL — ABNORMAL LOW (ref 3.5–5.0)
Anion gap: 17 — ABNORMAL HIGH (ref 5–15)
BUN: 55 mg/dL — ABNORMAL HIGH (ref 6–20)
CO2: 22 mmol/L (ref 22–32)
Calcium: 8.4 mg/dL — ABNORMAL LOW (ref 8.9–10.3)
Chloride: 96 mmol/L — ABNORMAL LOW (ref 98–111)
Creatinine, Ser: 7 mg/dL — ABNORMAL HIGH (ref 0.44–1.00)
GFR, Estimated: 7 mL/min — ABNORMAL LOW (ref >60)
Glucose, Bld: 167 mg/dL — ABNORMAL HIGH (ref 70–99)
Phosphorus: 8.4 mg/dL — ABNORMAL HIGH (ref 2.5–4.6)
Potassium: 4.7 mmol/L (ref 3.5–5.1)
Sodium: 135 mmol/L (ref 135–145)

## 2022-09-30 LAB — PHOSPHORUS: Phosphorus: 9.5 mg/dL — ABNORMAL HIGH (ref 2.5–4.6)

## 2022-09-30 LAB — CBC
HCT: 26.4 % — ABNORMAL LOW (ref 36.0–46.0)
Hemoglobin: 8.6 g/dL — ABNORMAL LOW (ref 12.0–15.0)
MCH: 30.6 pg (ref 26.0–34.0)
MCHC: 32.6 g/dL (ref 30.0–36.0)
MCV: 94 fL (ref 80.0–100.0)
Platelets: 306 10*3/uL (ref 150–400)
RBC: 2.81 MIL/uL — ABNORMAL LOW (ref 3.87–5.11)
RDW: 17 % — ABNORMAL HIGH (ref 11.5–15.5)
WBC: 6 10*3/uL (ref 4.0–10.5)
nRBC: 0.3 % — ABNORMAL HIGH (ref 0.0–0.2)

## 2022-09-30 LAB — BPAM RBC
Blood Product Expiration Date: 202403052359
ISSUE DATE / TIME: 202402202348
Unit Type and Rh: 7300

## 2022-09-30 LAB — GLUCOSE, CAPILLARY
Glucose-Capillary: 82 mg/dL (ref 70–99)
Glucose-Capillary: 141 mg/dL — ABNORMAL HIGH (ref 70–99)
Glucose-Capillary: 180 mg/dL — ABNORMAL HIGH (ref 70–99)

## 2022-09-30 LAB — BASIC METABOLIC PANEL
Anion gap: 18 — ABNORMAL HIGH (ref 5–15)
BUN: 45 mg/dL — ABNORMAL HIGH (ref 6–20)
CO2: 22 mmol/L (ref 22–32)
Calcium: 8.5 mg/dL — ABNORMAL LOW (ref 8.9–10.3)
Chloride: 95 mmol/L — ABNORMAL LOW (ref 98–111)
Creatinine, Ser: 6.64 mg/dL — ABNORMAL HIGH (ref 0.44–1.00)
GFR, Estimated: 7 mL/min — ABNORMAL LOW (ref >60)
Glucose, Bld: 136 mg/dL — ABNORMAL HIGH (ref 70–99)
Potassium: 5.1 mmol/L (ref 3.5–5.1)
Sodium: 135 mmol/L (ref 135–145)

## 2022-09-30 LAB — MAGNESIUM: Magnesium: 2.3 mg/dL (ref 1.7–2.4)

## 2022-09-30 LAB — HEPATITIS B SURFACE ANTIGEN: Hepatitis B Surface Ag: NONREACTIVE

## 2022-09-30 MED ORDER — CHLORHEXIDINE GLUCONATE CLOTH 2 % EX PADS
6.0000 | MEDICATED_PAD | Freq: Every day | CUTANEOUS | Status: DC
  Administered 2022-10-01: 6 via TOPICAL

## 2022-09-30 MED ORDER — ONDANSETRON HCL 4 MG/2ML IJ SOLN
4.0000 mg | Freq: Three times a day (TID) | INTRAMUSCULAR | Status: AC | PRN
  Administered 2022-09-30 – 2022-10-01 (×3): 4 mg via INTRAVENOUS
  Filled 2022-09-30 (×3): qty 2

## 2022-09-30 MED ORDER — CHLORHEXIDINE GLUCONATE CLOTH 2 % EX PADS
6.0000 | MEDICATED_PAD | Freq: Every day | CUTANEOUS | Status: DC
  Administered 2022-10-01 – 2022-10-02 (×2): 6 via TOPICAL

## 2022-09-30 MED ORDER — CALCITRIOL 0.5 MCG PO CAPS
1.0000 ug | ORAL_CAPSULE | ORAL | Status: DC
  Administered 2022-10-01: 1 ug via ORAL
  Filled 2022-09-30: qty 2

## 2022-09-30 MED ORDER — HYDROMORPHONE HCL 1 MG/ML IJ SOLN
0.5000 mg | Freq: Once | INTRAMUSCULAR | Status: AC
  Administered 2022-09-30: 0.5 mg via INTRAVENOUS
  Filled 2022-09-30: qty 0.5

## 2022-09-30 NOTE — TOC Initial Note (Signed)
Transition of Care [facility]) - Initial/Assessment Note    Patient Details  Name: Kathy Frank MRN: [id number] Date of Birth: 04-05-1976  Transition of Care [facility]) CM/SW Contact:    Tom-Johnson, Renea Ee, RN Phone Number: 09/30/2022, 4:38 PM  Clinical Narrative:                  CM spoke with patient at bedside about discharge needs. Patient is admitted for CAP, on 3L O2 and IV abx. Does not use O2 at home. 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 From home with two daughters, has three supportive children and they transports her to her dialysis appointments and assists with errands.  Has w/c, bsc, bilat prosthesis at home.  PCP is  [name], MD and uses [facility] on [address].  No TOC needs or recommendations noted at this time. CM will continue to follow as patient progresses with care towards discharge.          Expected Discharge Plan: Home/Self Care Barriers to Discharge: Continued Medical Work up   Patient Goals and CMS Choice Patient states their goals for this hospitalization and ongoing recovery are:: To return home CMS Medicare.gov Compare Post Acute Care list provided to:: Patient Choice offered to / list presented to : NA      Expected Discharge Plan and Services   Discharge Planning Services: CM Consult Post Acute Care Choice: NA Living arrangements for the past 2 months: Apartment                 DME Arranged: N/A DME Agency: NA       HH Arranged: NA HH Agency: NA        Prior Living Arrangements/Services Living arrangements for the past 2 months: Apartment Lives with:: Adult Children (2 daughters) Patient language and need for interpreter reviewed:: Yes Do you feel safe going back to the place where you live?: Yes      Need for Family Participation in Patient Care: Yes (Comment) Care giver support system in place?: Yes (comment) Current home services: DME (W/C, bsc, bilat  prosthesis.) Criminal Activity/Legal Involvement Pertinent to Current Situation/Hospitalization: No - Comment as needed  Activities of Daily Living      Permission Sought/Granted Permission sought to share information with : Case Manager, Family Supports Permission granted to share information with : Yes, Verbal Permission Granted              Emotional Assessment Appearance:: Appears stated age Attitude/Demeanor/Rapport: Engaged, Gracious Affect (typically observed): Accepting, Appropriate, Calm, Hopeful, Pleasant Orientation: : Oriented to Self, Oriented to Place, Oriented to  Time, Oriented to Situation Alcohol / Substance Use: Not Applicable Psych Involvement: No (comment)  Admission diagnosis:  CAP (community acquired pneumonia) [J18.9] Pneumonia of left lower lobe due to infectious organism [J18.9] Anemia due to chronic kidney disease, on chronic dialysis ([facility]) [N18.6, D63.1, Z99.2] Patient Active Problem List   Diagnosis Date Noted   CAP (community acquired pneumonia) 09/29/2022   Homelessness 08/26/2022   Migraine with aura 06/29/2022   Community acquired pneumonia 06/25/2022   Hypertensive emergency 11/09/2021   Requires assistance with activities of daily living (ADL) 10/27/2021   Analgesic overuse headache 06/28/2021   Urinary and fecal incontinence 05/30/2021   Epistaxis 05/27/2021   Status post coronary artery stent placement    Coronary artery disease involving native coronary artery of native heart with unstable angina pectoris ([place])    Hypertensive urgency 05/20/2021   History  of non-ST elevation myocardial infarction (NSTEMI) 05/20/2021   BKA stump complication ([place]) [id number]   Right below-knee amputee ([place]) 02/08/2021   Peripheral arterial disease ([place]) 01/29/2021   Anxiety 05/28/2020   Patient's other noncompliance with medication regimen 12/28/2019   Major depressive disorder, single episode, unspecified 04/24/2019   Secondary hyperparathyroidism  of renal origin ([place]) 04/24/2019   S/P AKA (above knee amputation), left ([place]) 12/16/2018   Anemia due to chronic kidney disease    Moderate protein-calorie malnutrition ([facility])    Adjustment disorder with mixed anxiety and depressed mood 08/15/2018   ESRD (end stage renal disease) ([place])    Fecal incontinence 02/04/2018   Aortic atherosclerosis ([place]) 09/30/2016   Human immunodeficiency virus (HIV) disease ([place]) 04/23/2016   Gastroparesis    Moderate nonproliferative diabetic retinopathy of both eyes ([place]) 11/21/2014   Esophageal reflux    Essential hypertension 11/16/2013   Normocytic anemia 11/15/2013   Depression 06/28/2006   Diabetes mellitus type 2 in obese ([place]) 06/28/1994   PCP:  [name], MD Pharmacy:   [place] ([place]), [place] - [address] [place] [address] Phone: [phone] Fax: [place] [address] Phone: [phone] Fax: [phone]     Social Determinants of Health (SDOH) Social History: SDOH Screenings   Depression (PHQ2-9): Low Risk  (08/26/2022)  Recent Concern: Depression (PHQ2-9) - High Risk (06/25/2022)  Tobacco Use: Medium Risk (09/29/2022)   SDOH Interventions:     Readmission Risk Interventions    05/23/2021    2:13 PM 04/22/2021    3:05 PM  Readmission Risk Prevention Plan  Transportation Screening Complete Complete  Medication Review ([place]) Complete Complete  PCP or Specialist appointment within 3-5 days of discharge Complete Complete  HRI or Home Care Consult Complete Complete  SW Recovery Care/Counseling Consult Complete Complete  Palliative Care Screening Not Applicable Not [place] Not Applicable Not Applicable

## 2022-09-30 NOTE — Discharge Instructions (Addendum)
Mrs Kathy Frank, Kathy Frank were hospitalized for shortness of breath and chest pain. Your shortness of breath was related to excess fluid which was removed with dialysis. Your chest pain was evaluated with several pictures of your lungs, ribs, spine, and blood vessels. No concerning finding was seen. Your pain is most likely related to your muscles and bones, but is not something that should cause you to worry. If you do notice a rash that develops where the pain is now, please call the clinic and tell them. This may be shingles. Please continue to use the lidocaine patches, voltaren gel, and tylenol to control the pain. Please also continue your flexeril.    Follow-up: - Please follow up with your primary care provider at the [facility] (please call to schedule an appointment) Phone: [phone] Address: [facility], [place], [place], [place] [address]  - Please follow up with  Phone: Address:

## 2022-09-30 NOTE — Consult Note (Signed)
Renal Service Consult Note [facility]  Kathy Frank [name], MD Requesting Physician: Dr. Jimmye Norman, Lenna Sciara.  Reason for Consult: ESRD pt w/ SOB, back pain  HPI: The patient is a 47 y.o. year-old w/ PMH as below who presented to ED on 2/20 for back pain and SOB. In ED resp panel neg, CXR L basilar possible pna, afeb, normal WBC. Pt admitted for possible PNA and started on IV abx and admitted. We are asked to see for dialysis.   Pt seen in room. States she has been coming off 4-5kg over her dry wt. She lives w/ her 2 dtrs and grandson, daughter drives her to HD. Has not missed any OP HD.  On HD x 2 yrs.   ROS - denies CP, no joint pain, no HA, no blurry vision, no rash, no diarrhea, no nausea/ vomiting, no dysuria, no difficulty voiding   Past Medical History  Past Medical History:  Diagnosis Date   Anal abscess    chronic   Anxiety    CAD (coronary artery disease)    CVA (cerebral vascular accident) ([place])    Depression 06/28/2006   Qualifier: Diagnosis of  By: Riccardo Dubin MD, Todd     Diabetes mellitus type 2 in obese ([place]) 06/28/1994   Dyspnea    uses oxygeb 2 liters per minute at dialysis   End stage renal disease on dialysis [facility])    on hemodialysis T/Th/Sat   Erosive esophagitis    Esophageal reflux    Eye redness    Gastroparesis    ? diabetic   GERD (gastroesophageal reflux disease)    History of blood transfusion 2019   Human immunodeficiency virus (HIV) disease ([place]) 04/23/2016   Hyperlipidemia    Hypertension    Metabolic bone disease [id number]   Moderate nonproliferative diabetic retinopathy of both eyes ([place]) 11/21/2014   11/14/14: Noted on retinal imaging; needs follow-up imaging in 6 months  05/22/16: Noted on retinal imaging again; needs follow-up imaging in 6 months   PAD (peripheral artery disease) ([place])    Type 2 diabetes mellitus with diabetic peripheral angiopathy without gangrene ([place]) 05/01/2019   Wears dentures     lower   Wound infection s/p L transmetatarsal amputation    Past Surgical History  Past Surgical History:  Procedure Laterality Date   ABDOMINAL AORTOGRAM W/LOWER EXTREMITY N/A 12/13/2020   Procedure: ABDOMINAL AORTOGRAM W/LOWER EXTREMITY;  Surgeon: Angelia Mould, MD;  Location: [place] [facility];  Service: Cardiovascular;  Laterality: N/A;   AMPUTATION Left 07/08/2018   Procedure: AMPUTATION FORTH RAY LEFT FOOT;  Surgeon: [name], MD;  Location: [place];  Service: Orthopedics;  Laterality: Left;   AMPUTATION Left 08/09/2018   Procedure: Left Transmetatarsal Amputation;  Surgeon: [name], MD;  Location: [place];  Service: Orthopedics;  Laterality: Left;   AMPUTATION Left 10/08/2018   Procedure: LEFT BELOW KNEE AMPUTATION;  Surgeon: [name], MD;  Location: [place];  Service: Orthopedics;  Laterality: Left;   AMPUTATION Left 10/28/2018   Procedure: REVISION BELOW KNEE AMPUTATION;  Surgeon: [name], MD;  Location: [place];  Service: Orthopedics;  Laterality: Left;   AMPUTATION Left 12/16/2018   Procedure: LEFT ABOVE KNEE AMPUTATION;  Surgeon: [name], MD;  Location: [place];  Service: Orthopedics;  Laterality: Left;   AMPUTATION Right 01/31/2021   Procedure: AMPUTATION BELOW KNEE;  Surgeon: Serafina Mitchell, MD;  Location: [place];  Service: Vascular;  Laterality: Right;  AMPUTATION Right 03/28/2021   Procedure: REVISION OF RIGHT BELOW KNEE AMPUTATION;  Surgeon: Serafina Mitchell, MD;  Location: [place];  Service: Vascular;  Laterality: Right;   AMPUTATION Right 04/18/2021   Procedure: right below knee amputation/washout placement wound vac;  Surgeon: Cherre Robins, MD;  Location: [place];  Service: Vascular;  Laterality: Right;   AV FISTULA PLACEMENT Left 10/14/2018   Procedure: Arteriovenous (Av) Fistula Creation Left Arm;  Surgeon: [name], MD;  Location: [place];  Service: Vascular;  Laterality: Left;   [place] Left 04/14/2019   Procedure:  BASILIC VEIN TRANSPOSITION SECOND STAGE LEFT ARM;  Surgeon: Rosetta Posner, MD;  Location: [place];  Service: Vascular;  Laterality: Left;   CORONARY STENT INTERVENTION N/A 05/21/2021   Procedure: CORONARY STENT INTERVENTION;  Surgeon: Leonie Man, MD;  Location: [place] [facility];  Service: Cardiovascular;  Laterality: N/A;   INCISION AND DRAINAGE ABSCESS N/A 10/24/2020   Procedure: exicision of hydradenitis;  Surgeon: Leighton Ruff, MD;  Location: [place];  Service: General;  Laterality: N/A;  45 min   INSERTION OF DIALYSIS CATHETER Right 04/14/2019   Procedure: INSERTION OF DIALYSIS CATHETER;  Surgeon: Rosetta Posner, MD;  Location: [facility];  Service: Vascular;  Laterality: Right;   LEFT HEART CATH AND CORONARY ANGIOGRAPHY N/A 05/21/2021   Procedure: LEFT HEART CATH AND CORONARY ANGIOGRAPHY;  Surgeon: Leonie Man, MD;  Location: [place] [facility];  Service: Cardiovascular;  Laterality: N/A;   LOWER EXTREMITY ANGIOGRAPHY N/A 07/05/2018   Procedure: LOWER EXTREMITY ANGIOGRAPHY;  Surgeon: Serafina Mitchell, MD;  Location: [place] [facility];  Service: Cardiovascular;  Laterality: N/A;   PERIPHERAL VASCULAR ATHERECTOMY Right 12/13/2020   Procedure: PERIPHERAL VASCULAR ATHERECTOMY;  Surgeon: Angelia Mould, MD;  Location: [place] [facility];  Service: Cardiovascular;  Laterality: Right;  Superficial femoral   PERIPHERAL VASCULAR BALLOON ANGIOPLASTY Left 07/05/2018   Procedure: PERIPHERAL VASCULAR BALLOON ANGIOPLASTY;  Surgeon: Serafina Mitchell, MD;  Location: [place] [facility];  Service: Cardiovascular;  Laterality: Left;  SFA   PERIPHERAL VASCULAR BALLOON ANGIOPLASTY Right 12/13/2020   Procedure: PERIPHERAL VASCULAR BALLOON ANGIOPLASTY;  Surgeon: Angelia Mould, MD;  Location: [place] [facility];  Service: Cardiovascular;  Laterality: Right;  Peroneal artery, anterior tibial artery   RECTAL EXAM UNDER ANESTHESIA N/A 10/24/2020   Procedure: EXAM UNDER ANESTHESIA;   Surgeon: Leighton Ruff, MD;  Location[facility];  Service: General;  Laterality: N/A;   STUMP REVISION Left 10/19/2018   Procedure: REVISION LEFT BELOW KNEE AMPUTATION;  Surgeon: [name], MD;  Location: [place];  Service: Orthopedics;  Laterality: Left;   TUBAL LIGATION  2002   Family History  Family History  Problem Relation Age of Onset   Diabetes Mother    Diabetes Brother    Diabetes Daughter    Diabetes Daughter    Mental retardation Brother        died from PNA   Diabetes Maternal Grandmother    Social History  reports that she quit smoking about 8 years ago. Her smoking use included cigarettes. She started smoking about 8 years ago. She has a 1.00 pack-year smoking history. She has never used smokeless tobacco. She reports that she does not currently use alcohol. She reports current drug use. Drug: Marijuana. Allergies No Known Allergies Home medications Prior to Admission medications   Medication Sig Start Date End Date Taking? Authorizing Provider  albuterol (VENTOLIN HFA) 108 (  90 Base) MCG/ACT inhaler Inhale 2 puffs into the lungs every 4 (four) hours as needed for wheezing or shortness of breath. 02/14/21  Yes Angiulli, Lavon Paganini, PA-C  amLODipine-olmesartan (AZOR) 10-40 MG tablet Take 1 tablet by mouth every evening. 07/16/22  Yes [provider]  bictegravir-emtricitabine-tenofovir AF (BIKTARVY) 50-200-25 MG TABS tablet Take 1 tablet by mouth daily. OVERDUE FOR OFFICE FOLLOW UP - PLEASE CALL OUR OFFICE TO SCHEDULE [phone] 08/26/22  Yes Michel Bickers, MD  calcitRIOL (ROCALTROL) 0.25 MCG capsule Take 5 capsules (1.25 mcg total) by mouth Every Tuesday,Thursday,and Saturday with dialysis. 07/02/22  Yes Rick Duff, MD  cyclobenzaprine (FLEXERIL) 5 MG tablet Take 5 mg by mouth 2 (two) times daily as needed for muscle spasms. 05/21/22  Yes [provider]  Darbepoetin Alfa (ARANESP) 200 MCG/0.4ML SOSY injection Inject 0.4 mLs (200 mcg  total) into the skin every Monday at 6 PM. 07/06/22  Yes Rick Duff, MD  gabapentin (NEURONTIN) 100 MG capsule Take 100 mg by mouth at bedtime. 09/10/22  Yes [provider]  insulin glargine (LANTUS SOLOSTAR) 100 UNIT/ML Solostar Pen Inject 15 Units into the skin at bedtime. 06/30/22  Yes Rick Duff, MD  liraglutide (VICTOZA) 18 MG/3ML SOPN Inject 0.6 mg into the skin daily. 06/30/22 06/25/23 Yes Rick Duff, MD  LOKELMA 5 g packet Take 5 g by mouth daily. 07/30/22  Yes [provider]  multivitamin (RENA-VIT) TABS tablet Take 1 tablet by mouth daily. Patient taking differently: Take 1 tablet by mouth See admin instructions. On Tuesday, Wednesday and Thursday 02/14/21  Yes Angiulli, Lavon Paganini, PA-C  polyethylene glycol (MIRALAX / GLYCOLAX) 17 g packet Take 17 g by mouth daily. Patient taking differently: Take 17 g by mouth daily as needed for moderate constipation or mild constipation. 07/01/22  Yes Rick Duff, MD  Accu-Chek Softclix Lancets lancets Use as instructed 05/02/21   Atway, Rayann N, DO  amLODipine (NORVASC) 10 MG tablet Take 1 tablet (10 mg total) by mouth daily. Patient not taking: Reported on 08/26/2022 05/15/21   [name], MD  amoxicillin-clavulanate (AUGMENTIN) 500-125 MG tablet Take 1 tablet by mouth at bedtime. Patient not taking: Reported on 08/26/2022 06/26/22   Virl Axe, MD  atorvastatin (LIPITOR) 80 MG tablet Take 1 tablet (80 mg total) by mouth daily. Patient not taking: Reported on 06/28/2022 05/24/21   Masters, Joellen Jersey, DO  calcium acetate (PHOSLO) 667 MG capsule Take 2 capsules (1,334 mg total) by mouth 3 (three) times daily with meals. Patient taking differently: Take 2,001 mg by mouth 3 (three) times daily with meals. 06/30/22   Rick Duff, MD  Continuous Blood Gluc Receiver (DEXCOM G7 RECEIVER) DEVI Use to check blood sugars cntinuously 06/30/22   Rick Duff, MD  Continuous Blood Gluc Sensor (DEXCOM G7  SENSOR) MISC Use to check blood sugar continuously 06/30/22   Rick Duff, MD  EQ ASPIRIN ADULT LOW DOSE 81 MG tablet TAKE 1 TABLET BY MOUTH ONCE DAILY. SWALLOW WHOLE. Patient not taking: Reported on 09/29/2022 02/06/22   Waynetta Sandy, MD  glucose blood test strip Use as instructed 05/02/21   Atway, Rayann N, DO  Insulin Pen Needle (PENTIPS) 32G X 4 MM MISC Use as directed 02/14/21   Alger Simons T, MD     Vitals:   09/30/22 0105 09/30/22 0339 09/30/22 0432 09/30/22 0854  BP: (!) 190/70 (!) 177/60 107/82 (!) 157/77  Pulse: 86 87 88 81  Resp: 18 18 18 18  $ Temp: 98 F (36.7 C) 98 F (36.7  C) 98.2 F (36.8 C) 98.3 F (36.8 C)  TempSrc: Oral Oral Oral Oral  SpO2: 97% 100% 100% 100%  Weight:      Height:       Exam Gen alert, no distress ,obese No rash, cyanosis or gangrene Sclera anicteric, throat clear  No jvd or bruits Chest clear bilat to bases, no rales/ wheezing RRR no MRG Abd soft ntnd no mass or ascites +bs GU defer MS no joint effusions or deformity Ext no LE or UE edema, no wounds or ulcers Neuro is alert, Ox 3 , nf    LUA AVF+bruit    Home meds include - albuterol, amlodipine/ olmesartan, biktarvy, rocaltrol, norvasc 10, lipitor, phoslo 2 ac tid, aspirin, prns/ vits/ supps  CXR - showed pulm venous htn w/o pulm edema   OP HD: TTS [facility] 4h  400/1.5    78kg  2K/ 3Ca bath  LUA AVF  Hep none - last HD 2/20, post wt 83.7kg; coming off 3-5kg over the last 1-2 wks - rocaltrol 1.0 mcg po tiw - venofer 130m tiw thru 3/14 - mircera 200 mcg q 2, last 2/15, due 2/29 - last Hb 6.6 on 2/20  Assessment/ Plan: PNA - started on IV abx, possible pna L base. No fever or ^wbc.  SOB - coming off HD 4-5 kg for last few sessions. Here she is 5 kg over and CXR w/ vasc congestion.  ESRD - on HD TTS. Has not missed HD. Plan is for HD today and tomorrow to get volume down.  HTN/ volume - as above. BP's high.  Anemia esrd - Hb low, esa not due til 2/29. Transfuse prn.   MBD ckd - CCa in range, add on phos. Cont po vdra and phoslo as binder. HIV DM2      RKelly Splinter MD [facility]A Frank, 9:43 AM  Recent Labs  Lab 09/27/22 1746 09/29/22 1310 09/30/22 0827  HGB 7.3* 6.7* 8.6*  ALBUMIN 3.0* 2.9*  --   CALCIUM 9.0 9.4 8.5*  CREATININE 7.01* 4.53* 6.64*  K 4.5 3.9 5.1   Inpatient medications:  sodium chloride   Intravenous Once   amLODipine  10 mg Oral Daily   aspirin EC  81 mg Oral Daily   atorvastatin  80 mg Oral Daily   benzonatate  100 mg Oral Once   bictegravir-emtricitabine-tenofovir AF  1 tablet Oral Daily   [START ON 10/01/2022] calcitRIOL  1.25 mcg Oral Q T,Th,Sa-HD   calcium acetate  1,334 mg Oral TID WC   heparin  5,000 Units Subcutaneous Q8H   insulin aspart  0-5 Units Subcutaneous QHS   insulin aspart  0-6 Units Subcutaneous TID WC   insulin glargine-yfgn  15 Units Subcutaneous QHS   irbesartan  300 mg Oral Daily   lidocaine  1 patch Transdermal Q24H    azithromycin     cefTRIAXone (ROCEPHIN)  IV 1 g (09/30/22 0915)   acetaminophen **OR** acetaminophen, albuterol, cyclobenzaprine, guaiFENesin, ondansetron (ZOFRAN) IV, senna-docusate

## 2022-09-30 NOTE — Discharge Summary (Incomplete)
   Name: Kathy Frank MRN: [id number] DOB: [birth year] 47 y.o. PCP: [name], MD  Date of Admission: 09/29/2022 12:54 PM Date of Discharge: 09/30/22  Attending Physician: Angelica Pou, MD  Discharge Diagnosis: 1. Principal Problem:   CAP (community acquired pneumonia)   Discharge Medications: Allergies as of 09/30/2022   No Known Allergies   Med Rec must be completed prior to using this [facility]***       Disposition and follow-up:  (pertinent information about hosp course) [name] was discharged from [facility] in Stable condition.  At the hospital follow up visit please address:  1.  ***  2.  Labs / imaging needed at time of follow-up: ***  3.  Pending labs/ test needing follow-up: ***  Follow-up Appointments:  Future Appointments  Date Time Provider [place]  10/02/2022  8:30 AM [facility] RM 2 [facility] None  10/06/2022  1:30 PM [place], [place] Hospital Course by problem list: (House for Each Hosp Problem) 1. *** 2. ***  Discharge Exam:   BP 107/82 (BP Location: Right Wrist)   Pulse 88   Temp 98.2 F (36.8 C) (Oral)   Resp 18   Ht 5' 5"$  (1.651 m)   Wt 75.3 kg   LMP  (LMP Unknown)   SpO2 100%   BMI 27.62 kg/m  Discharge exam: *** (PE)  Pertinent Labs, Studies, and Procedures: (pull up lab dot phrases ) ***  Discharge Instructions: (pull up from med rec section)   Signed: Christene Slates, MD 09/30/2022, 8:18 AM   Pager: [phone]

## 2022-09-30 NOTE — Progress Notes (Signed)
Contacted by attending staff regarding pt's HD plans for tomorrow. Case discussed with nephrologist. Pt is not stable for d/c today from his perspective due to pt's shortness of breath and need for fluid removal. Contacted [facility] and spoke to charge RN. At this time, clinic does not have a 2nd shift appt available and pt not stable for d/c to make 6:40 am appt tomorrow. At this time, pt will require inpt HD today and tomorrow in order to assess pt's stability for d/c per renal. Update provided to attending staff regarding the above. Pt may be stable for d/c tomorrow post HD per medical staff. Will assist as needed.   Melven Sartorius Renal Navigator [phone]

## 2022-09-30 NOTE — Care Management Obs Status (Signed)
[place] NOTIFICATION   Patient Details  Name: NAYDINE ROGNESS MRN: [id number] Date of Birth: 1976-07-30   Medicare Observation Status Notification Given:  Yes    Tom-Johnson, Renea Ee, RN 09/30/2022, 4:34 PM

## 2022-09-30 NOTE — Progress Notes (Signed)
HD#0 SUBJECTIVE:  Patient Summary: Kathy Frank is a 47 y.o. with a pertinent PMH of ESRD on HD Tuesday Thursday Saturday, diabetes type 2, left AKA and right BKA, HIV, hypertension, hyperlipidemia, PAD, hyperparathyroidism of renal origin who presented with shortness of breath, cough, body aches and admitted for pneumonia.   Overnight Events: No acute events overnight    Interm History: Complains of continued pain.  Says she feels like she has extra fluid on her.  Complains of shortness of breath and continued cough.  OBJECTIVE:  Vital Signs: Vitals:   09/30/22 0854 09/30/22 1245 09/30/22 1535 09/30/22 1958  BP: (!) 157/77  (!) 124/47 (!) 159/76  Pulse: 81  84 92  Resp: 18  16 18  $ Temp: 98.3 F (36.8 C)  98.3 F (36.8 C) 99.1 F (37.3 C)  TempSrc: Oral  Oral Oral  SpO2: 100%  100% 100%  Weight:  83.9 kg    Height:       Physical Exam: Constitutional: chronically ill-appearing female sitting in bed, in no acute distress Cardiovascular: regular rate and rhythm, grade 3/6 systolic murmur best heard at RUSB Pulmonary/Chest: normal work of breathing on room air, able to speak in full sentences without accessory muscle use, diminished breath sounds in left lower lung field Abdominal: soft, non-tender, non-distended MSK: s/p R BKA and L AKA; tenderness to palpation of T5-7 paraspinal region with midline tenderness or deformity Neurological: alert & oriented x 3 Skin: warm and dry, normal skin turgor Psych: Normal mood and affect  Patient Lines/Drains/Airways Status     Active Line/Drains/Airways     Name Placement date Placement time Site Days   Peripheral IV 09/29/22 20 G Posterior;Right Hand 09/29/22  2100  Hand  1   Fistula / Graft Left Forearm Arteriovenous fistula 04/14/19  1642  Forearm  1265   Fistula / Graft Left Upper arm --  --  Upper arm  --   Fistula / Graft Left Upper arm --  --  Upper arm  --   Negative Pressure Wound Therapy 04/18/21  1444  --  530    Negative Pressure Wound Therapy Leg Anterior;Right 05/20/21  0000  --  498            Pertinent Labs:    Latest Ref Rng & Units 09/30/2022    8:27 AM 09/29/2022    1:10 PM 09/27/2022    5:46 PM  CBC  WBC 4.0 - 10.5 K/uL 6.0  7.4  6.7   Hemoglobin 12.0 - 15.0 g/dL 8.6  6.7  7.3   Hematocrit 36.0 - 46.0 % 26.4  20.7  23.0   Platelets 150 - 400 K/uL 306  330  301        Latest Ref Rng & Units 09/30/2022    8:27 AM 09/29/2022    1:10 PM 09/27/2022    5:46 PM  CMP  Glucose 70 - 99 mg/dL 136  222  179   BUN 6 - 20 mg/dL 45  30  57   Creatinine 0.44 - 1.00 mg/dL 6.64  4.53  7.01   Sodium 135 - 145 mmol/L 135  135  138   Potassium 3.5 - 5.1 mmol/L 5.1  3.9  4.5   Chloride 98 - 111 mmol/L 95  94  97   CO2 22 - 32 mmol/L 22  25  25   $ Calcium 8.9 - 10.3 mg/dL 8.5  9.4  9.0   Total Protein 6.5 - 8.1 g/dL  7.9  7.7   Total Bilirubin 0.3 - 1.2 mg/dL  <0.1  0.1   Alkaline Phos 38 - 126 U/L  45  47   AST 15 - 41 U/L  12  14   ALT 0 - 44 U/L  9  10     Recent Labs    09/29/22 2258 09/30/22 1148 09/30/22 1622  GLUCAP 177* 82 141*     Pertinent Imaging: No results found.  ASSESSMENT/PLAN:  Assessment: Principal Problem:   CAP (community acquired pneumonia) Active Problems:   Pneumonia  Kathy Frank is a 47 y.o. person living with a history of ESRD on HD TThS, type 2 diabetes c/b diabetic retinopathy, s/p left AKA and right BKA, HIV, hypertension, hyperlipidemia, PAD, and secondary hyperparathyroidism who presented with SOB and back pain and admitted for CAP on hospital day 0   Community acquired pneumonia Patient presented with shortness of breath, cough, chills, and myalgias, but no fevers. CXR with LLL opacity, concerning for pneumonia. The patient's vital signs are all stable and she is afebrile. CBC without any leukocytosis. Respiratory panel negative for COVID of influenza.  She is being dialyzed per nephrology for volume overload.  If shortness of breath improved with  this, antibiotics can be discontinued. -continue Rocephin and Azithromycin  -Albuterol q4h PRN  -Mucinex BID PRN   Thoracic back pain Back pain with reproduction on palpation to mid thoracic and paraspinal muscles.  Most suggestive of musculoskeletal etiology.  It is worsened with deep inhalation but fortunately well score is low and no other signs or symptoms to suggest PE.  She has metabolic bone disease and with her heart coughing attacks possible that she has a thoracic compression fracture or slipped disc.  Will further evaluate with T-spine x-ray.  If this is unrevealing and pain persists, may require CT versus MRI for further evaluation. In the meantime continue analgesia with lidocaine patches, Flexeril, Tylenol   ESRD on HD TThS Patient noted to be 5 kg over in vascular congestion noted on dry weight despite not missing HD.  Plans for volume removal today as well as tomorrow.  She initially was scheduled for 6:45 AM appointment tomorrow but will not be able to make this time so she will have early second shift done here inpatient.  May be able to discharge if stable. 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  Received 1 unit PRBCs with improvement of hemoglobin.  Stable   Hypertension Home medication include amlodipine-olmesartan 10-40 mg daily. -resume amlodipine 10 mg  -irbesartan 300 mg daily (olmesartan not on formulary) - Further management of hypertension per nephrology   Type 2 diabetes Last A1c 6.3% in November - could be falseley low ESRD on HD. Home medications include Lantus 15u qhs and Victoza 0.6 mg daily. -continue home lantus 15u qhs  -on SSI -CBG ACHS   HIV Patient follows with Dr. Megan Salon yearly and her HIV has been undetectable. -continue home Biktarvy   Diet: Renal VTE: Heparin IVF: None,None Code: Full Prior to Admission Living Arrangement: Home, living daughters Anticipated Discharge Location:  TBD Barriers to  Discharge: Medical stability and workup Dispo: Admit patient to Observation with expected length of stay less than 2 midnights.  Signature: Delene Ruffini, MD  Internal Medicine Resident, PGY-2 [facility]  Pager: [phone] 8:48 PM, 09/30/2022   Please contact the on call pager after 5 pm and on weekends at [phone].

## 2022-10-01 ENCOUNTER — Inpatient Hospital Stay (HOSPITAL_COMMUNITY): Payer: 59

## 2022-10-01 ENCOUNTER — Other Ambulatory Visit (HOSPITAL_COMMUNITY): Payer: Self-pay

## 2022-10-01 DIAGNOSIS — N186 End stage renal disease: Secondary | ICD-10-CM

## 2022-10-01 DIAGNOSIS — J189 Pneumonia, unspecified organism: Secondary | ICD-10-CM | POA: Diagnosis not present

## 2022-10-01 DIAGNOSIS — Z992 Dependence on renal dialysis: Secondary | ICD-10-CM | POA: Diagnosis not present

## 2022-10-01 DIAGNOSIS — R079 Chest pain, unspecified: Secondary | ICD-10-CM | POA: Insufficient documentation

## 2022-10-01 DIAGNOSIS — D631 Anemia in chronic kidney disease: Secondary | ICD-10-CM | POA: Diagnosis not present

## 2022-10-01 DIAGNOSIS — Z87891 Personal history of nicotine dependence: Secondary | ICD-10-CM

## 2022-10-01 DIAGNOSIS — I120 Hypertensive chronic kidney disease with stage 5 chronic kidney disease or end stage renal disease: Secondary | ICD-10-CM

## 2022-10-01 LAB — RENAL FUNCTION PANEL
Albumin: 2.9 g/dL — ABNORMAL LOW (ref 3.5–5.0)
Anion gap: 18 — ABNORMAL HIGH (ref 5–15)
BUN: 36 mg/dL — ABNORMAL HIGH (ref 6–20)
CO2: 24 mmol/L (ref 22–32)
Calcium: 8.4 mg/dL — ABNORMAL LOW (ref 8.9–10.3)
Chloride: 95 mmol/L — ABNORMAL LOW (ref 98–111)
Creatinine, Ser: 6.25 mg/dL — ABNORMAL HIGH (ref 0.44–1.00)
GFR, Estimated: 8 mL/min — ABNORMAL LOW (ref >60)
Glucose, Bld: 98 mg/dL (ref 70–99)
Phosphorus: 6.7 mg/dL — ABNORMAL HIGH (ref 2.5–4.6)
Potassium: 4.2 mmol/L (ref 3.5–5.1)
Sodium: 137 mmol/L (ref 135–145)

## 2022-10-01 LAB — CBC WITH DIFFERENTIAL/PLATELET
Abs Immature Granulocytes: 0.01 10*3/uL (ref 0.00–0.07)
Basophils Absolute: 0 10*3/uL (ref 0.0–0.1)
Basophils Relative: 1 %
Eosinophils Absolute: 0.1 10*3/uL (ref 0.0–0.5)
Eosinophils Relative: 2 %
HCT: 25.5 % — ABNORMAL LOW (ref 36.0–46.0)
Hemoglobin: 8.1 g/dL — ABNORMAL LOW (ref 12.0–15.0)
Immature Granulocytes: 0 %
Lymphocytes Relative: 14 %
Lymphs Abs: 0.8 10*3/uL (ref 0.7–4.0)
MCH: 30.8 pg (ref 26.0–34.0)
MCHC: 31.8 g/dL (ref 30.0–36.0)
MCV: 97 fL (ref 80.0–100.0)
Monocytes Absolute: 0.6 10*3/uL (ref 0.1–1.0)
Monocytes Relative: 10 %
Neutro Abs: 4.4 10*3/uL (ref 1.7–7.7)
Neutrophils Relative %: 73 %
Platelets: 332 10*3/uL (ref 150–400)
RBC: 2.63 MIL/uL — ABNORMAL LOW (ref 3.87–5.11)
RDW: 16.6 % — ABNORMAL HIGH (ref 11.5–15.5)
WBC: 6 10*3/uL (ref 4.0–10.5)
nRBC: 0 % (ref 0.0–0.2)

## 2022-10-01 LAB — GLUCOSE, CAPILLARY
Glucose-Capillary: 162 mg/dL — ABNORMAL HIGH (ref 70–99)
Glucose-Capillary: 114 mg/dL — ABNORMAL HIGH (ref 70–99)
Glucose-Capillary: 82 mg/dL (ref 70–99)
Glucose-Capillary: 86 mg/dL (ref 70–99)

## 2022-10-01 LAB — HEPATITIS B SURFACE ANTIBODY, QUANTITATIVE: Hep B S AB Quant (Post): 137.8 m[IU]/mL (ref >9.9)

## 2022-10-01 MED ORDER — ACETAMINOPHEN 500 MG PO TABS
1000.0000 mg | ORAL_TABLET | Freq: Three times a day (TID) | ORAL | Status: DC
  Administered 2022-10-01 – 2022-10-02 (×3): 1000 mg via ORAL
  Filled 2022-10-01 (×4): qty 2

## 2022-10-01 MED ORDER — ACETAMINOPHEN 325 MG PO TABS
650.0000 mg | ORAL_TABLET | Freq: Four times a day (QID) | ORAL | Status: DC | PRN
  Administered 2022-10-01 – 2022-10-02 (×2): 650 mg via ORAL
  Filled 2022-10-01 (×3): qty 2

## 2022-10-01 MED ORDER — ACETAMINOPHEN 650 MG RE SUPP: 650.0000 mg | Freq: Four times a day (QID) | RECTAL | Status: DC | PRN

## 2022-10-01 MED ORDER — HYDROMORPHONE HCL 1 MG/ML IJ SOLN
0.5000 mg | Freq: Four times a day (QID) | INTRAMUSCULAR | Status: DC | PRN
  Administered 2022-10-01 – 2022-10-02 (×4): 0.5 mg via INTRAVENOUS
  Filled 2022-10-01: qty 1
  Filled 2022-10-01: qty 0.5
  Filled 2022-10-01 (×2): qty 1

## 2022-10-01 MED ORDER — LIDOCAINE 5 % EX PTCH
1.0000 | MEDICATED_PATCH | CUTANEOUS | Status: DC
  Administered 2022-10-01 – 2022-10-02 (×2): 1 via TRANSDERMAL
  Filled 2022-10-01 (×2): qty 1

## 2022-10-01 MED ORDER — HYDROMORPHONE HCL 1 MG/ML IJ SOLN: 0.5000 mg | Freq: Once | INTRAMUSCULAR | Status: DC

## 2022-10-01 MED ORDER — SODIUM CHLORIDE 0.9 % IV SOLN: INTRAVENOUS | Status: DC | PRN

## 2022-10-01 MED ORDER — HYDROMORPHONE HCL 1 MG/ML IJ SOLN
0.5000 mg | Freq: Once | INTRAMUSCULAR | Status: AC
  Administered 2022-10-01: 0.5 mg via INTRAVENOUS
  Filled 2022-10-01: qty 1

## 2022-10-01 MED ORDER — GABAPENTIN 100 MG PO CAPS
100.0000 mg | ORAL_CAPSULE | Freq: Every day | ORAL | Status: DC
  Administered 2022-10-01: 100 mg via ORAL
  Filled 2022-10-01: qty 1

## 2022-10-01 MED ORDER — ACETAMINOPHEN 500 MG PO TABS
1000.0000 mg | ORAL_TABLET | Freq: Four times a day (QID) | ORAL | Status: DC | PRN
  Administered 2022-10-01: 1000 mg via ORAL
  Filled 2022-10-01: qty 2

## 2022-10-01 NOTE — Progress Notes (Signed)
Patient's CBG=86. Zheng,MD notified,telephone order received to hold semglee insulin tonight.

## 2022-10-01 NOTE — Progress Notes (Signed)
Paged provider regarding pain they will be reviewing and rounding soon.

## 2022-10-01 NOTE — Progress Notes (Addendum)
[facility] Progress Note   Subjective:    Seen and examined patient at bedside. Currently c/o R rib pain. Denies SOB, CP, and N/V. Tolerated yesterday's HD with net UF 2.4L. Plan for HD again today.  Objective Vitals:   10/01/22 0130 10/01/22 0157 10/01/22 0500 10/01/22 1038  BP: (!) 175/70 (!) 184/138  (!) 132/101  Pulse: 89 89  90  Resp: 15 18  18  $ Temp: 98.1 F (36.7 C) 98.2 F (36.8 C)    TempSrc: Oral Oral    SpO2: 99% 97%  98%  Weight:   83.4 kg   Height:       Physical Exam General: Awake, alert, on RA, NAD Heart: S1 and S2; No murmurs, gallops, or rubs Lungs:Clear anteriorly and laterally; No wheezing, rales, or rhonchi Abdomen: Soft and non-tender Extremities: R BKA; L AKA; No edema bilateral stumps Dialysis Access: LUE AVF (+) B/T   Filed Weights   09/29/22 1300 09/30/22 1245 10/01/22 0500  Weight: 75.3 kg 83.9 kg 83.4 kg    Intake/Output Summary (Last 24 hours) at 10/01/2022 1354 Last data filed at 10/01/2022 1300 Gross per 24 hour  Intake 1190 ml  Output 2400 ml  Net -1210 ml    Additional Objective Labs: Basic Metabolic Panel: Recent Labs  Lab 09/29/22 1310 09/30/22 0827 09/30/22 2300  NA 135 135 135  K 3.9 5.1 4.7  CL 94* 95* 96*  CO2 25 22 22  $ GLUCOSE 222* 136* 167*  BUN 30* 45* 55*  CREATININE 4.53* 6.64* 7.00*  CALCIUM 9.4 8.5* 8.4*  PHOS  --  9.5* 8.4*   Liver Function Tests: Recent Labs  Lab 09/27/22 1746 09/29/22 1310 09/30/22 2300  AST 14* 12*  --   ALT 10 9  --   ALKPHOS 47 45  --   BILITOT 0.1* <0.1*  --   PROT 7.7 7.9  --   ALBUMIN 3.0* 2.9* 2.8*   No results for input(s): "LIPASE", "AMYLASE" in the last 168 hours. CBC: Recent Labs  Lab 09/27/22 1746 09/29/22 1310 09/30/22 0827  WBC 6.7 7.4 6.0  NEUTROABS  --  5.7  --   HGB 7.3* 6.7* 8.6*  HCT 23.0* 20.7* 26.4*  MCV 99.1 97.6 94.0  PLT 301 330 306   Blood Culture    Component Value Date/Time   SDES  06/25/2022 1638    BLOOD RIGHT HAND  BOTTLES DRAWN AEROBIC AND ANAEROBIC Blood Culture results may not be optimal due to an excessive volume of blood received in culture bottles   SPECREQUEST NONE 06/25/2022 1638   CULT  06/25/2022 1638    NO GROWTH 5 DAYS Performed at [facility] [address] [place], [place] [address]    REPTSTATUS 06/30/2022 FINAL 06/25/2022 1638    Cardiac Enzymes: No results for input(s): "CKTOTAL", "CKMB", "CKMBINDEX", "TROPONINI" in the last 168 hours. CBG: Recent Labs  Lab 09/30/22 1148 09/30/22 1622 09/30/22 2136 10/01/22 0725 10/01/22 1148  GLUCAP 82 141* 180* 162* 114*   Iron Studies: No results for input(s): "IRON", "TIBC", "TRANSFERRIN", "FERRITIN" in the last 72 hours. Lab Results  Component Value Date   INR 1.2 06/27/2022   INR 1.1 05/20/2021   INR 1.0 03/28/2021   Studies/Results: DG Chest 1 View  Result Date: 09/29/2022 CLINICAL DATA:  Shortness of breath.  Chest pain. EXAM: CHEST  1 VIEW COMPARISON:  09/27/2022 FINDINGS: Mild enlargement of the cardiopericardial silhouette. Mildly indistinct pulmonary vasculature suggesting pulmonary venous hypertension. Reduce conspicuity of the  left hemidiaphragm in the retrocardiac region, raising suspicion for new retrocardiac airspace opacity in the left lower lobe. No blunting of the costophrenic angles. Atherosclerotic calcification of the aortic arch. IMPRESSION: 1. Mild enlargement of the cardiopericardial silhouette with pulmonary venous hypertension but no overt edema. 2. Reduce conspicuity of the left hemidiaphragm in the retrocardiac region, raising suspicion for new airspace opacity in the left lower lobe, potentially from pneumonia or atelectasis. Electronically Signed   By: Van Clines M.D.   On: 09/29/2022 16:35   DG Lumbar Spine Complete  Result Date: 09/29/2022 CLINICAL DATA:  Shortness of breath.  Dialysis. EXAM: LUMBAR SPINE - COMPLETE 4+ VIEW COMPARISON:  CT abdomen 12/31/2018 FINDINGS: Extensive vascular  calcifications including the aortoiliac tree and renal arteries. Degenerative hip arthropathy bilaterally, right greater than left. Scattered stool in the colon.  No dilated bowel observed. No lumbar fracture or malalignment. Preserved intervertebral articular spaces. IMPRESSION: 1. No acute lumbar spine findings. 2. Extensive vascular calcifications. 3. Degenerative hip arthropathy bilaterally, right greater than left. Electronically Signed   By: Van Clines M.D.   On: 09/29/2022 16:28    Medications:  azithromycin 500 mg (09/30/22 1158)   cefTRIAXone (ROCEPHIN)  IV 1 g (09/30/22 0915)    sodium chloride   Intravenous Once   acetaminophen  1,000 mg Oral Q8H   amLODipine  10 mg Oral Daily   aspirin EC  81 mg Oral Daily   atorvastatin  80 mg Oral Daily   benzonatate  100 mg Oral Once   bictegravir-emtricitabine-tenofovir AF  1 tablet Oral Daily   calcitRIOL  1 mcg Oral Q T,Th,Sa-HD   calcium acetate  1,334 mg Oral TID WC   Chlorhexidine Gluconate Cloth  6 each Topical Q0600   Chlorhexidine Gluconate Cloth  6 each Topical Q0600   gabapentin  100 mg Oral QHS   heparin  5,000 Units Subcutaneous Q8H   insulin aspart  0-5 Units Subcutaneous QHS   insulin aspart  0-6 Units Subcutaneous TID WC   insulin glargine-yfgn  15 Units Subcutaneous QHS   irbesartan  300 mg Oral Daily   lidocaine  1 patch Transdermal Q24H    Dialysis Orders: TTS [facility] 4h  400/1.5    78kg  2K/ 3Ca bath  LUA AVF  Hep none - last HD 2/20, post wt 83.7kg; coming off 3-5kg over the last 1-2 wks - rocaltrol 1.0 mcg po tiw - venofer 110m tiw thru 3/14 - mircera 200 mcg q 2, last 2/15, due 2/29 - last Hb 6.6 on 2/20  Home meds include - albuterol, amlodipine/ olmesartan, biktarvy, rocaltrol, norvasc 10, lipitor, phoslo 2 ac tid, aspirin, prns/ vits/ supps   Assessment/Plan: PNA - started on IV abx, possible pna L base. Remains afebrile and WBC trending down-now 6.  SOB - coming off HD 4-5 kg for last few sessions.  Here she is 5 kg over and CXR w/ vasc congestion.  ESRD - on HD TTS. Has not missed HD. Received HD yesterday and removed 2.4L and again today to get volume down.  HTN/ volume - as above. BP's high. Goal to come down with HD. Anemia esrd - Hb low, esa not due til 2/29. Transfuse prn.  MBD ckd - CCa in range and PO4 is high. Cont po vdra and phoslo as binder. HIV DM2  CTobie Poet NP [facility] 10/01/2022,1:54 PM  LOS: 1 day

## 2022-10-01 NOTE — Progress Notes (Addendum)
HD#1 SUBJECTIVE:  Patient Summary: Kathy Frank is a 47 y.o. with a pertinent PMH of ESRD on HD Tuesday Thursday Saturday, diabetes type 2, left AKA and right BKA, HIV, hypertension, hyperlipidemia, PAD, hyperparathyroidism of renal origin who presented with shortness of breath, cough, body aches and admitted for pneumonia.   Overnight Events: Patient briefly in Vfib at ~10PM and 3rd degree heart block at 7AM. Both arrhythmias resolved, NSR when evaluated. Given Dilaudid 0.5 mg IV x1.     Interm History: Patient evaluated at bedside in the AM, is seen in pain, crying. Reports right sided pain, describes as pleural (worsens with deep inspiration) also painful on palpation. Later evaluated s/p Dilaudid, flexeril and lidocaine patch, less pain but is unable to get comfortable.   OBJECTIVE:  Vital Signs: Vitals:   10/01/22 0130 10/01/22 0157 10/01/22 0500 10/01/22 1038  BP: (!) 175/70 (!) 184/138  (!) 132/101  Pulse: 89 89  90  Resp: '15 18  18  '$ Temp: 98.1 F (36.7 C) 98.2 F (36.8 C)    TempSrc: Oral Oral    SpO2: 99% 97%  98%  Weight:   83.4 kg   Height:       Physical Exam: Constitutional: chronically ill-appearing female sitting in bed, in no acute distress Cardiovascular: regular rate and rhythm, grade 3/6 systolic murmur best heard at RUSB Pulmonary/Chest: normal work of breathing on room air, able to speak in full sentences without accessory muscle use, diminished breath sounds in left lower lung field Abdominal: soft, non-tender, non-distended MSK: s/p R BKA and L AKA; tenderness to palpation of T5-7 paraspinal region with midline tenderness or deformity Neurological: alert & oriented x 3 Skin: warm and dry, normal skin turgor Psych: Normal mood and affect  Patient Lines/Drains/Airways Status     Active Line/Drains/Airways     Name Placement date Placement time Site Days   Peripheral IV 09/29/22 20 G Posterior;Right Hand 09/29/22  2100  Hand  1   Fistula / Graft  Left Forearm Arteriovenous fistula 04/14/19  1642  Forearm  1265   Fistula / Graft Left Upper arm --  --  Upper arm  --   Fistula / Graft Left Upper arm --  --  Upper arm  --   Negative Pressure Wound Therapy 04/18/21  1444  --  530   Negative Pressure Wound Therapy Leg Anterior;Right 05/20/21  0000  --  498            Pertinent Labs:    Latest Ref Rng & Units 09/30/2022    8:27 AM 09/29/2022    1:10 PM 09/27/2022    5:46 PM  CBC  WBC 4.0 - 10.5 K/uL 6.0  7.4  6.7   Hemoglobin 12.0 - 15.0 g/dL 8.6  6.7  7.3   Hematocrit 36.0 - 46.0 % 26.4  20.7  23.0   Platelets 150 - 400 K/uL 306  330  301        Latest Ref Rng & Units 09/30/2022   11:00 PM 09/30/2022    8:27 AM 09/29/2022    1:10 PM  CMP  Glucose 70 - 99 mg/dL 167  136  222   BUN 6 - 20 mg/dL 55  45  30   Creatinine 0.44 - 1.00 mg/dL 7.00  6.64  4.53   Sodium 135 - 145 mmol/L 135  135  135   Potassium 3.5 - 5.1 mmol/L 4.7  5.1  3.9   Chloride 98 - 111  mmol/L 96  95  94   CO2 22 - 32 mmol/L '22  22  25   '$ Calcium 8.9 - 10.3 mg/dL 8.4  8.5  9.4   Total Protein 6.5 - 8.1 g/dL   7.9   Total Bilirubin 0.3 - 1.2 mg/dL   <0.1   Alkaline Phos 38 - 126 U/L   45   AST 15 - 41 U/L   12   ALT 0 - 44 U/L   9     Recent Labs    09/30/22 2136 10/01/22 0725 10/01/22 1148  GLUCAP 180* 162* 114*     Pertinent Imaging: No results found.  ASSESSMENT/PLAN:  Assessment: Principal Problem:   CAP (community acquired pneumonia) Active Problems:   Pneumonia  Kathy Frank is a 47 y.o. person living with a history of ESRD on HD TThS, type 2 diabetes c/b diabetic retinopathy, s/p left AKA and right BKA, HIV, hypertension, hyperlipidemia, PAD, and secondary hyperparathyroidism who presented with SOB and back pain and admitted for CAP on hospital day 0   Community acquired pneumonia Remains afebrile overnight. Clear lung sound bilaterally. Satting well on RA. CXR with LLL opacity, concerning for pneumonia. - F/u morning CBC -  Continue Rocephin and Azithromycin  - Continue Albuterol q4h PRN  - Continue Mucinex BID PRN   Thoracic back pain Rib Pain Likely multifactorial. Most suggestive of musculoskeletal etiology. Back pain with reproduction on palpation to mid thoracic and paraspinal muscles. It is worsened with deep inhalation but fortunately well score is low and no other signs or symptoms to suggest PE. She has metabolic bone disease and with her heart coughing attacks possible that she has a thoracic compression fracture or slipped disc or posterior rib fracture.  In the meantime continue analgesia with lidocaine patches, Flexeril, Tylenol. Dilaudid 0.5 IV x1 seems to have helped with pain today. - Appreciate nursing's attention and care - F/u chest CT w/o contrast   ESRD on HD TThS HD overnight, removed 2.4 L.  Plans for volume removal today as well as tomorrow.  She initially was scheduled for 6:45 AM appointment tomorrow but will not be able to make this time so she will have early second shift done here inpatient.  May be able to discharge if stable. - HD per nephro, appreciate care and recommendations   Periodic Arrhthymias, short, self-limited, asymptomatic Patient briefly in Vfib and 3 degree heart block, now in NSR. Will continue to monitor.   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  Received 1 unit PRBCs with improvement of hemoglobin 09/30/2021. - F/u AM CBC tomorrow   Hypertension Home medication include amlodipine-olmesartan 10-40 mg daily. - Resume amlodipine 10 mg  - Irbesartan 300 mg daily (olmesartan not on formulary) - Further management of hypertension per nephrology   Type 2 diabetes Last A1c 6.3% in November - could be falseley low ESRD on HD. Home medications include Lantus 15u qhs and Victoza 0.6 mg daily. - Continue home lantus 15u qhs  - Continue SSI - CBG ACHS   HIV Chronic, stable. Patient follows with Dr. Megan Salon yearly and her HIV has been  undetectable. -Continue home Biktarvy   Diet: Renal VTE: Heparin IVF: None,None Code: Full Prior to Admission Living Arrangement: Home, living daughters Anticipated Discharge Location:  TBD Barriers to Discharge: Medical stability and workup Dispo: Admit patient to Observation with expected length of stay less than 2 midnights.  Christene Slates, PGY-1 Internal Medicine Residency Pager: [phone]  Please contact the on  call pager after 5 pm and on weekends at [phone].

## 2022-10-01 NOTE — Progress Notes (Signed)
Post Hemodialysis   10/01/22 0130  Vitals  Temp 98.1 F (36.7 C)  Pulse Rate 89  Resp 15  BP (!) 175/70  SpO2 99 %  O2 Device Nasal Cannula  Oxygen Therapy  O2 Flow Rate (L/min) 2 L/min  Pulse Oximetry Type Continuous  Oximetry Probe Site Changed No  Post Treatment  Dialyzer Clearance Lightly streaked  Duration of HD Treatment -hour(s) 2.5 hour(s)  Liters Processed 44.1  Fluid Removed (mL) 2400 mL  Tolerated HD Treatment Yes  Post-Hemodialysis Comments Tx tolerated well, after pain managed, pt asked to end tx with 30 min left to use the bathroom, declined to compled RX time of 3Hrs.  AVG/AVF Arterial Site Held (minutes) 7 minutes  AVG/AVF Venous Site Held (minutes) 3 minutes

## 2022-10-01 NOTE — Progress Notes (Signed)
At 2130, IMTS paged by RN about 60 beats of ventricular tachycardia. Patient felt like her heart was racing. Denies chest pain or worsening shortness of breath.   On assessment, patient is awake and alert, sitting up in bed and not in acute distress. No longer felt her heart was racing. Telemetry showed NSR.   STAT renal function panel ordered to assess electrolytes. Continue on telemetry. Continue with plan for HD tonight.

## 2022-10-01 NOTE — Plan of Care (Signed)
  Problem: Fluid Volume: Goal: Ability to maintain a balanced intake and output will improve Outcome: Progressing   Problem: Tissue Perfusion: Goal: Adequacy of tissue perfusion will improve Outcome: Progressing   Problem: Fluid Volume: Goal: Compliance with measures to maintain balanced fluid volume will improve Outcome: Progressing   Problem: Coping: Goal: Ability to adjust to condition or change in health will improve Outcome: Not Progressing   Problem: Nutritional: Goal: Maintenance of adequate nutrition will improve Outcome: Not Progressing

## 2022-10-02 ENCOUNTER — Encounter (HOSPITAL_COMMUNITY): Payer: 59

## 2022-10-02 ENCOUNTER — Other Ambulatory Visit (HOSPITAL_COMMUNITY): Payer: Self-pay

## 2022-10-02 ENCOUNTER — Encounter (HOSPITAL_COMMUNITY): Payer: Self-pay

## 2022-10-02 DIAGNOSIS — R0789 Other chest pain: Secondary | ICD-10-CM

## 2022-10-02 LAB — RENAL FUNCTION PANEL
Albumin: 3 g/dL — ABNORMAL LOW (ref 3.5–5.0)
Anion gap: 17 — ABNORMAL HIGH (ref 5–15)
BUN: 19 mg/dL (ref 6–20)
CO2: 26 mmol/L (ref 22–32)
Calcium: 8.9 mg/dL (ref 8.9–10.3)
Chloride: 91 mmol/L — ABNORMAL LOW (ref 98–111)
Creatinine, Ser: 5 mg/dL — ABNORMAL HIGH (ref 0.44–1.00)
GFR, Estimated: 10 mL/min — ABNORMAL LOW (ref >60)
Glucose, Bld: 93 mg/dL (ref 70–99)
Phosphorus: 5.8 mg/dL — ABNORMAL HIGH (ref 2.5–4.6)
Potassium: 3.7 mmol/L (ref 3.5–5.1)
Sodium: 134 mmol/L — ABNORMAL LOW (ref 135–145)

## 2022-10-02 LAB — CBC
HCT: 28.4 % — ABNORMAL LOW (ref 36.0–46.0)
Hemoglobin: 9.2 g/dL — ABNORMAL LOW (ref 12.0–15.0)
MCH: 31.1 pg (ref 26.0–34.0)
MCHC: 32.4 g/dL (ref 30.0–36.0)
MCV: 95.9 fL (ref 80.0–100.0)
Platelets: 352 10*3/uL (ref 150–400)
RBC: 2.96 MIL/uL — ABNORMAL LOW (ref 3.87–5.11)
RDW: 16 % — ABNORMAL HIGH (ref 11.5–15.5)
WBC: 5.7 10*3/uL (ref 4.0–10.5)
nRBC: 0 % (ref 0.0–0.2)

## 2022-10-02 LAB — GLUCOSE, CAPILLARY
Glucose-Capillary: 107 mg/dL — ABNORMAL HIGH (ref 70–99)
Glucose-Capillary: 90 mg/dL (ref 70–99)

## 2022-10-02 MED ORDER — ONDANSETRON HCL 4 MG/2ML IJ SOLN
4.0000 mg | Freq: Once | INTRAMUSCULAR | Status: AC
  Administered 2022-10-02: 4 mg via INTRAVENOUS
  Filled 2022-10-02: qty 2

## 2022-10-02 MED ORDER — CYCLOBENZAPRINE HCL 5 MG PO TABS
5.0000 mg | ORAL_TABLET | Freq: Two times a day (BID) | ORAL | 0 refills | Status: DC | PRN
  Filled 2022-10-02: qty 30, 15d supply, fill #0

## 2022-10-02 MED ORDER — ONDANSETRON HCL 4 MG PO TABS
4.0000 mg | ORAL_TABLET | Freq: Three times a day (TID) | ORAL | 0 refills | Status: DC | PRN
  Filled 2022-10-02: qty 30, 10d supply, fill #0

## 2022-10-02 MED ORDER — LIDOCAINE 5 % EX PTCH
1.0000 | MEDICATED_PATCH | CUTANEOUS | 0 refills | Status: DC
  Filled 2022-10-02: qty 30, 30d supply, fill #0

## 2022-10-02 MED ORDER — AMLODIPINE-OLMESARTAN 10-40 MG PO TABS
1.0000 | ORAL_TABLET | Freq: Every evening | ORAL | 0 refills | Status: DC
  Filled 2022-10-02: qty 30, 30d supply, fill #0

## 2022-10-02 MED ORDER — DICLOFENAC SODIUM 1 % EX GEL
4.0000 g | Freq: Four times a day (QID) | CUTANEOUS | 0 refills | Status: DC
  Filled 2022-10-02: qty 100, 7d supply, fill #0

## 2022-10-02 NOTE — Progress Notes (Signed)
DISCHARGE NOTE HOME WALAA RIX to be discharged Home per MD order. Discussed prescriptions and follow up appointments with the patient. Prescriptions given to patient; medication list explained in detail. Patient verbalized understanding.  Skin clean, dry and intact without evidence of skin break down, no evidence of skin tears noted. IV catheter discontinued intact. Site without signs and symptoms of complications. Dressing and pressure applied. Pt denies pain at the site currently. No complaints noted.  Patient free of lines, drains, and wounds.   An After Visit Summary (AVS) was printed and given to the patient. SWOT escorted to discharge lounge: Patient escorted via wheelchair, and will discharged home via private auto.  Hassell Halim, LPN

## 2022-10-02 NOTE — TOC Transition Note (Signed)
Transition of Care [facility]) - CM/SW Discharge Note   Patient Details  Name: FEYZA SANDGREN MRN: [id number] Date of Birth: June 24, 1976  [facility] CM/SW Contact:  Tom-Johnson, Renea Ee, RN Phone Number: 10/02/2022, 2:08 PM   Clinical Narrative:     Patient is scheduled for discharge today. Outpatient f/u and discharge instructions on AVS. Family to transport at discharge. No TOC needs or recommendations noted. No further TOC needs noted.        Final next level of care: Home/Self Care Barriers to Discharge: Barriers Resolved   Patient Goals and CMS Choice CMS Medicare.gov Compare Post Acute Care list provided to:: Patient Choice offered to / list presented to : NA  Discharge Placement                  Patient to be transferred to facility by: Family      Discharge Plan and Services Additional resources added to the After Visit Summary for     Discharge Planning Services: CM Consult Post Acute Care Choice: NA          DME Arranged: N/A DME Agency: NA       HH Arranged: NA HH Agency: NA        Social Determinants of Health (SDOH) Interventions SDOH Screenings   Depression (PHQ2-9): Low Risk  (08/26/2022)  Recent Concern: Depression (PHQ2-9) - High Risk (06/25/2022)  Tobacco Use: Medium Risk (09/29/2022)     Readmission Risk Interventions    05/23/2021    2:13 PM 04/22/2021    3:05 PM  Readmission Risk Prevention Plan  Transportation Screening Complete Complete  Medication Review Press photographer) Complete Complete  PCP or Specialist appointment within 3-5 days of discharge Complete Complete  HRI or [place] Complete Complete  SW Recovery Care/Counseling Consult Complete Complete  Palliative Care Screening Not Applicable Not [place] Not Applicable Not Applicable

## 2022-10-02 NOTE — Discharge Summary (Cosign Needed)
Name: Kathy Frank MRN: [id number] DOB: 1976/04/17 47 y.o. PCP: [name], MD  Date of Admission: 09/29/2022 12:54 PM Date of Discharge: 10/02/22  Attending Physician: Angelica Pou, MD  Discharge Diagnosis: 1. Principal Problem:   Right-sided chest pain, musculoskeletal Active Problems:   ESRD on dialysis ([facility])   Dyspnea due to anemia and to hypervolemia due to ESRD settings   HIV, treated   HTN   Discharge Medications: Allergies as of 10/02/2022   No Known Allergies      Medication List     STOP taking these medications    amLODipine 10 MG tablet Commonly known as: NORVASC   amoxicillin-clavulanate 500-125 MG tablet Commonly known as: Augmentin       TAKE these medications    Accu-Chek Softclix Lancets lancets Use as instructed   amLODipine-olmesartan 10-40 MG tablet Commonly known as: AZOR Take 1 tablet by mouth every evening.   atorvastatin 80 MG tablet Commonly known as: LIPITOR Take 1 tablet (80 mg total) by mouth daily.   Biktarvy 50-200-25 MG Tabs tablet Generic drug: bictegravir-emtricitabine-tenofovir AF Take 1 tablet by mouth daily. OVERDUE FOR OFFICE FOLLOW UP - PLEASE CALL OUR OFFICE TO SCHEDULE [phone]   calcitRIOL 0.25 MCG capsule Commonly known as: ROCALTROL Take 5 capsules (1.25 mcg total) by mouth Every Tuesday,Thursday,and Saturday with dialysis.   calcium acetate 667 MG capsule Commonly known as: PHOSLO Take 2 capsules (1,334 mg total) by mouth 3 (three) times daily with meals. What changed: how much to take   cyclobenzaprine 5 MG tablet Commonly known as: FLEXERIL Take 1 tablet (5 mg total) by mouth 2 (two) times daily as needed for muscle spasms.   Darbepoetin Alfa 200 MCG/0.4ML Sosy injection Commonly known as: ARANESP Inject 0.4 mLs (200 mcg total) into the skin every Monday at 6 PM.   Dexcom G7 Receiver Devi Use to check blood sugars cntinuously   Dexcom G7 Sensor Misc Use to check blood sugar  continuously   EQ Aspirin Adult Low Dose 81 MG tablet Generic drug: aspirin EC TAKE 1 TABLET BY MOUTH ONCE DAILY. SWALLOW WHOLE.   gabapentin 100 MG capsule Commonly known as: NEURONTIN Take 100 mg by mouth at bedtime.   glucose blood test strip Use as instructed   Lantus SoloStar 100 UNIT/ML Solostar Pen Generic drug: insulin glargine Inject 15 Units into the skin at bedtime.   lidocaine 5 % Commonly known as: LIDODERM Place 1 patch onto the skin daily. Remove & Discard patch within 12 hours or as directed by MD Start taking on: October 03, 2022   liraglutide 18 MG/3ML Sopn Commonly known as: VICTOZA Inject 0.6 mg into the skin daily.   Lokelma 5 g packet Generic drug: sodium zirconium cyclosilicate Take 5 g by mouth daily.   multivitamin Tabs tablet Take 1 tablet by mouth daily. What changed:  when to take this additional instructions   ondansetron 4 MG tablet Commonly known as: Zofran Take 1 tablet (4 mg total) by mouth every 8 (eight) hours as needed for up to 10 days for nausea or vomiting.   Pentips 32G X 4 MM Misc Generic drug: Insulin Pen Needle Use as directed   polyethylene glycol 17 g packet Commonly known as: MIRALAX / GLYCOLAX Take 17 g by mouth daily. What changed:  when to take this reasons to take this   ProAir HFA 108 (90 Base) MCG/ACT inhaler Generic drug: albuterol Inhale 2 puffs into the lungs every 4 (four) hours as needed for  wheezing or shortness of breath.       Disposition and follow-up:  Ms.Carmelia C Kroon was discharged from [facility] in Stable condition.  At the hospital follow up visit please address:  Chronic Normocytic Anemia Consider repeat CBC, Hgb 9.2 stable on day of discharge Ensure patient continues to take Arenesp injections every 1-2 weeks  ESRD on HD TTS Ensure patient continues to go to HD session  Rib Pain Follow up on patient's rib pain has improved Ensure she has continued to use  incentive spirometer at home  Nausea Patient reports nausea and some vomiting night prior to discharge, improved with zofran Ensure these symptoms have resolved  Pulmonary Nodule  Incidental 4 mm right upper lobe nodule Consider follow up non-contrast CT at 12 months  Follow-up Appointments:  Future Appointments  Date Time Provider [place]  10/06/2022  1:30 PM Hadassah Pais, LCSW [facility]  10/19/2022 10:45 AM [name], MD [facility] by problem list:  Community acquired pneumonia, ruled out Patient presented with shortness of breath, cough, chills, and myalgias, but no fevers. CXR with LLL opacity, initially concerning for pneumonia. The patient's vital signs are all stable and she is afebrile. CBC without any leukocytosis. Respiratory panel negative for COVID of influenza. Received dose of rocephin and azithromycin in ED, remained asymptomatic, afebrile, no leukocytosis. Chest CT no infiltrate.  Antibiotics discontinued on day of discharge. Patient told she could continue Albuterol q4h PRN and Mucinex BID PRN for symptomatic relief of cough.    Thoracic back pain and R chest wall pain, resolving Acute back pain without any red flag symptoms, recent trauma or injury. Pain exacerbated with deep breathing and cough.. She received one dose of IV dilaudid 1 mg in the ED. Home flexeril continued. Also added lidocaine patch and Dilaudid 0.5 mg Q6HR PRN for severe pain. CT chest negative for any fractures, Throacic XR negative for any spinal compression or slipped discs. Patient encouraged to use incentive spirometry. Symptoms appeared improved on day of discharge.   ESRD on HD TThS, chronic stable Patient has been on dialysis for a few years (since 2020), but notes that she does still make urine. Last HD session was today and she has not missed any recently. Nephro was consulted from the ED for inpatient dialysis needs, should she still be admitted on  Thursday. Electrolytes were checked and are within normal limits. Patient received HD twice, 2/21 and 3/22, total of 5.66L removed by discharge. Home calcium and calcitriol was continued.    Worsened normocytic anemia, transfused Patient has chronic anemia likely secondary to her renal disease. She was told her Hb was low as an outpatient and was scheduled for a transfusion on 2/23. Hb in the ED was 6.7 improving to 9.2 s/p 1u PRBC. Patient receives arenesp injections every 1-2 weeks. Hgb remained stable during hospitalization.   Hypertension, chronic stable Home medication include amlodipine-olmesartan 10-40 mg daily. Hypertensive on admission, SBP 140-160s. Resumed home amlodipine 10 mg and olmesartan was replaced with the substitution irbersartan 300 mg daily. VSS during hospitalization. Patient discharged with home omelsartan.    Type 2 diabetes, chronic stable Last A1c 6.3% in November. Home medications, Lantus 15u qhs was continued and placed on SSI. CBG in 80s on morning of discharge, secondary to poor PO intake due to nausea.    HIV, chronic stable Patient follows with Dr. Megan Salon yearly and her HIV has been undetectable. Home Biktarvy was continued.  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 No lesions, ulcers, or blisters appreciated.  Psych: Normal mood and affect  Pertinent Labs, Studies, and Procedures:     Latest  Ref Rng & Units 10/02/2022   10:08 AM 10/01/2022    2:49 PM 09/30/2022    8:27 AM  CBC  WBC 4.0 - 10.5 K/uL 5.7  6.0  6.0   Hemoglobin 12.0 - 15.0 g/dL 9.2  8.1  8.6   Hematocrit 36.0 - 46.0 % 28.4  25.5  26.4   Platelets 150 - 400 K/uL 352  332  306        Latest Ref Rng & Units 10/02/2022   10:08 AM 10/01/2022    2:49 PM 09/30/2022   11:00 PM  CMP  Glucose 70 - 99 mg/dL 93  98  167   BUN 6 - 20 mg/dL 19  36  55   Creatinine 0.44 - 1.00 mg/dL 5.00  6.25  7.00   Sodium 135 - 145 mmol/L 134  137  135   Potassium 3.5 - 5.1 mmol/L 3.7  4.2  4.7   Chloride 98 - 111 mmol/L 91  95  96   CO2 22 - 32 mmol/L '26  24  22   '$ Calcium 8.9 - 10.3 mg/dL 8.9  8.4  8.4     Results for orders placed or performed during the hospital encounter of 09/29/22  Resp panel by RT-PCR (RSV, Flu A&B, Covid) Anterior Nasal Swab     Status: None   Collection Time: 09/29/22  1:23 PM   Specimen: Anterior Nasal Swab  Result Value Ref Range Status   SARS Coronavirus 2 by RT PCR NEGATIVE NEGATIVE Final   Influenza A by PCR NEGATIVE NEGATIVE Final   Influenza B by PCR NEGATIVE NEGATIVE Final    Comment: (NOTE) The Xpert Xpress SARS-CoV-2/FLU/RSV plus assay is intended as an aid in the diagnosis of influenza from Nasopharyngeal swab specimens and should not be used as a sole basis for treatment. Nasal washings and aspirates are unacceptable for Xpert Xpress SARS-CoV-2/FLU/RSV testing.  Fact Sheet for Patients: EntrepreneurPulse.com.au  Fact Sheet for Healthcare Providers: IncredibleEmployment.be  This test is not yet approved or cleared by the [place] FDA and has been authorized for detection and/or diagnosis of SARS-CoV-2 by FDA under an Emergency Use Authorization (EUA). This EUA will remain in effect (meaning this test can be used) for the duration of the COVID-19 declaration under Section 564(b)(1) of the Act, 21 U.S.C. section 360bbb-3(b)(1), unless the  authorization is terminated or revoked.     Resp Syncytial Virus by PCR NEGATIVE NEGATIVE Final    Comment: (NOTE) Fact Sheet for Patients: EntrepreneurPulse.com.au  Fact Sheet for Healthcare Providers: IncredibleEmployment.be  This test is not yet approved or cleared by the [place] FDA and has been authorized for detection  and/or diagnosis of SARS-CoV-2 by FDA under an Emergency Use Authorization (EUA). This EUA will remain in effect (meaning this test can be used) for the duration of the COVID-19 declaration under Section 564(b)(1) of the Act, 21 U.S.C. section 360bbb-3(b)(1), unless the authorization is terminated or revoked.  Performed at [facility], [place] [address] [place], [place] [address]   DG Thoracic Spine 2 View  Result Date: 10/01/2022 CLINICAL DATA:  Back pain for 2 days EXAM: THORACIC SPINE 2 VIEWS COMPARISON:  CT from 1 hour previous FINDINGS: Vertebral body height is well maintained similar to that seen on recent CT examination. No paraspinal mass lesion is noted. Pedicles are within normal limits. IMPRESSION: No acute abnormality noted in the thoracic spine. No change from the CT obtained 1 hour previous. Electronically Signed   By: Inez Catalina M.D.   On: 10/01/2022 22:57   CT CHEST WO CONTRAST  Result Date: 10/01/2022 CLINICAL DATA:  Severe right rib and chest pain and back pain. EXAM: CT CHEST WITHOUT CONTRAST TECHNIQUE: Multidetector CT imaging of the chest was performed following the standard protocol without IV contrast. RADIATION DOSE REDUCTION: This exam was performed according to the departmental dose-optimization program which includes automated exposure control, adjustment of the mA and/or kV according to patient size and/or use of iterative reconstruction technique. COMPARISON:  Portable chest 09/29/2022; portable chest 09/27/2022; CT abdomen and pelvis without contrast 12/31/2018. FINDINGS: Cardiovascular:  There is mild cardiomegaly. 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 No pericardial effusion. Coronary artery disease was not previously seen. There is age advanced diffuse aortic calcific disease, scattered calcification in the great vessels. There is no aortic aneurysm. The pulmonary arteries and veins are normal in caliber Mediastinum/Nodes: No enlarged mediastinal or axillary lymph nodes. Thyroid gland, trachea, and esophagus demonstrate no significant findings. There is a mildly elevated right hemidiaphragm chronically. Lungs/Pleura: There are increased linear atelectatic markings in both posterior basal lower lobes. There is increased bilateral lower lobe bronchial thickening. On the right there is additional posterior basal hazy opacity and band consolidation which be additional atelectasis or atelectasis intermixed with pneumonia. There is linear atelectasis in the base of the lingula and right middle lobe. Anteriorly in the right upper lobe there is a 4 mm noncalcified subpleural nodule on 8:43. There are no other visible nodules. Remaining lungs are clear.  No pleural effusion or pneumothorax. Upper Abdomen: Extensive vascular calcifications , increased from 12/31/2018. New bilateral renal atrophy. Musculoskeletal: No chest wall mass or suspicious bone lesions identified. An os acromiale is incidentally noted on the right. The visualized ribcage is intact. The lower ribcage is not fully included. IMPRESSION: 1. Mild cardiomegaly. 2. Age-advanced calcific CAD and aortic atherosclerosis not seen in 2020. 3. Bilateral lower lobe bronchial thickening consistent with bronchitis. 4. Right posterior basal hazy opacity and band consolidation which could be atelectasis or atelectasis intermixed with pneumonia. 5. 4 mm right upper lobe nodule. Per Fleischner Society Guidelines, if patient is low risk for malignancy, no routine follow-up imaging is recommended. If patient is high risk for  malignancy, a non-contrast Chest CT at 12 months is optional. If performed and the nodule is stable at 12 months, no further follow-up is recommended. These guidelines do not apply to immunocompromised patients and patients with cancer. Follow up in patients with significant comorbidities as clinically warranted. For lung cancer screening, adhere to Lung-RADS guidelines. Reference: Radiology. 2017; 284(1):228-43. 6. New bilateral renal atrophy. Extensive upper abdominal vascular calcifications. 7. The lower ribcage is  not fully included. The visualized ribcage is intact. Aortic Atherosclerosis (ICD10-I70.0). Electronically Signed   By: Telford Nab M.D.   On: 10/01/2022 21:53   DG Chest 1 View  Result Date: 09/29/2022 CLINICAL DATA:  Shortness of breath.  Chest pain. EXAM: CHEST  1 VIEW COMPARISON:  09/27/2022 FINDINGS: Mild enlargement of the cardiopericardial silhouette. Mildly indistinct pulmonary vasculature suggesting pulmonary venous hypertension. Reduce conspicuity of the left hemidiaphragm in the retrocardiac region, raising suspicion for new retrocardiac airspace opacity in the left lower lobe. No blunting of the costophrenic angles. Atherosclerotic calcification of the aortic arch. IMPRESSION: 1. Mild enlargement of the cardiopericardial silhouette with pulmonary venous hypertension but no overt edema. 2. Reduce conspicuity of the left hemidiaphragm in the retrocardiac region, raising suspicion for new airspace opacity in the left lower lobe, potentially from pneumonia or atelectasis. Electronically Signed   By: Van Clines M.D.   On: 09/29/2022 16:35   DG Lumbar Spine Complete  Result Date: 09/29/2022 CLINICAL DATA:  Shortness of breath.  Dialysis. EXAM: LUMBAR SPINE - COMPLETE 4+ VIEW COMPARISON:  CT abdomen 12/31/2018 FINDINGS: Extensive vascular calcifications including the aortoiliac tree and renal arteries. Degenerative hip arthropathy bilaterally, right greater than left. Scattered  stool in the colon.  No dilated bowel observed. No lumbar fracture or malalignment. Preserved intervertebral articular spaces. IMPRESSION: 1. No acute lumbar spine findings. 2. Extensive vascular calcifications. 3. Degenerative hip arthropathy bilaterally, right greater than left. Electronically Signed   By: Van Clines M.D.   On: 09/29/2022 16:28    Discharge Instructions:  Discharge Instructions     Call MD for:  difficulty breathing, headache or visual disturbances   Complete by: As directed    Call MD for:  extreme fatigue   Complete by: As directed    Call MD for:  hives   Complete by: As directed    Call MD for:  persistant dizziness or light-headedness   Complete by: As directed    Call MD for:  persistant nausea and vomiting   Complete by: As directed    Call MD for:  redness, tenderness, or signs of infection (pain, swelling, redness, odor or green/yellow discharge around incision site)   Complete by: As directed    Call MD for:  severe uncontrolled pain   Complete by: As directed    Call MD for:  temperature >100.4   Complete by: As directed    Diet - low sodium heart healthy   Complete by: As directed    Discharge instructions   Complete by: As directed    Dear Ms. Tamala Julian,  On behalf of the Internal Medicine team, it was a pleasure caring for you. You came to the hospital for shortness of breath and cough. While you were in the hospital we were concerned you had pneumonia at first. You were started on antibiotics and we monitored you closely. You also began to have very painful rib and back pain. Thankfully, the X-ray of your chest did not show any fractures. You have not had any fevers and your shortness of breath has improved. Because of all of this, we do not suspect you have pneumonia anymore and will not send you home on antibiotics. We believe you are well enough to continue recovering at home!  For your rib pain - It is very important you continue to practice  taking deep breaths with the spirometer, this will keep your lungs healthy and active. This will also decrease the chances of fluid building up. -  Place a lidocaine patch once a day on the areas that are sore. - Continue taking your flexeril for muscle spasms - If you begin to see blisters forming on your ribcage, please contact the clinic at [phone].  Please continue taking your other home medications as directed, we refilled your flexeril and blood pressure medications (Amlodipine-olmesartan).   You have the following upcoming appointments:  Monday, 10/19/2022 at 10:45 AM with Dr. [facility]  Friday, 10/02/2022 at 8:30 AM [place], Room 2  Tuesday, 10/06/2022 at 1:30 PM [facility]   We wish you a speedy recovery and will see soon you at our clinic!  Sincerely,   Dr. Christene Slates, MD   Increase activity slowly   Complete by: As directed    Increase activity slowly   Complete by: As directed        Signed: Christene Slates, MD 10/02/2022, 1:35 PM   Pager: [phone]

## 2022-10-02 NOTE — Progress Notes (Signed)
Pt to d/c to home today. Contacted [facility] to advise clinic of pt's d/c today and that pt should resume care tomorrow.   Melven Sartorius Renal Navigator [phone]

## 2022-10-02 NOTE — Plan of Care (Signed)
  Problem: Education: Goal: Ability to describe self-care measures that may prevent or decrease complications (Diabetes Survival Skills Education) will improve Outcome: Adequate for Discharge Goal: Individualized Educational Video(s) Outcome: Adequate for Discharge   Problem: Coping: Goal: Ability to adjust to condition or change in health will improve Outcome: Adequate for Discharge   Problem: Fluid Volume: Goal: Ability to maintain a balanced intake and output will improve Outcome: Adequate for Discharge   Problem: Health Behavior/Discharge Planning: Goal: Ability to identify and utilize available resources and services will improve Outcome: Adequate for Discharge Goal: Ability to manage health-related needs will improve Outcome: Adequate for Discharge   Problem: Metabolic: Goal: Ability to maintain appropriate glucose levels will improve Outcome: Adequate for Discharge   Problem: Nutritional: Goal: Maintenance of adequate nutrition will improve Outcome: Adequate for Discharge Goal: Progress toward achieving an optimal weight will improve Outcome: Adequate for Discharge   Problem: Skin Integrity: Goal: Risk for impaired skin integrity will decrease Outcome: Adequate for Discharge   Problem: Tissue Perfusion: Goal: Adequacy of tissue perfusion will improve Outcome: Adequate for Discharge   Problem: Education: Goal: Individualized Educational Video(s) Outcome: Adequate for Discharge   Problem: Fluid Volume: Goal: Compliance with measures to maintain balanced fluid volume will improve Outcome: Adequate for Discharge   Problem: Health Behavior/Discharge Planning: Goal: Ability to manage health-related needs will improve Outcome: Adequate for Discharge   Problem: Nutritional: Goal: Ability to make healthy dietary choices will improve Outcome: Adequate for Discharge   Problem: Clinical Measurements: Goal: Complications related to the disease process, condition or  treatment will be avoided or minimized Outcome: Adequate for Discharge   Problem: Education: Goal: Knowledge of General Education information will improve Description: Including pain rating scale, medication(s)/side effects and non-pharmacologic comfort measures Outcome: Adequate for Discharge   Problem: Health Behavior/Discharge Planning: Goal: Ability to manage health-related needs will improve Outcome: Adequate for Discharge   Problem: Clinical Measurements: Goal: Ability to maintain clinical measurements within normal limits will improve Outcome: Adequate for Discharge Goal: Will remain free from infection Outcome: Adequate for Discharge Goal: Diagnostic test results will improve Outcome: Adequate for Discharge Goal: Respiratory complications will improve Outcome: Adequate for Discharge Goal: Cardiovascular complication will be avoided Outcome: Adequate for Discharge   Problem: Activity: Goal: Risk for activity intolerance will decrease Outcome: Adequate for Discharge   Problem: Nutrition: Goal: Adequate nutrition will be maintained Outcome: Adequate for Discharge   Problem: Coping: Goal: Level of anxiety will decrease Outcome: Adequate for Discharge   Problem: Elimination: Goal: Will not experience complications related to bowel motility Outcome: Adequate for Discharge Goal: Will not experience complications related to urinary retention Outcome: Adequate for Discharge   Problem: Pain Managment: Goal: General experience of comfort will improve Outcome: Adequate for Discharge   Problem: Safety: Goal: Ability to remain free from injury will improve Outcome: Adequate for Discharge   Problem: Skin Integrity: Goal: Risk for impaired skin integrity will decrease Outcome: Adequate for Discharge

## 2022-10-02 NOTE — Progress Notes (Signed)
[facility] Progress Note   Subjective:    Seen in room. Feeling better. No issues with dialysis yesterday. Says she might go home today.   Objective Vitals:   10/01/22 1832 10/01/22 2039 10/02/22 0609 10/02/22 0756  BP: (!) 171/69 (!) 172/92 (!) 178/87 (!) 175/84  Pulse: 87 91 88 91  Resp: '18 18 18 18  '$ Temp: 98.3 F (36.8 C) 99 F (37.2 C) 98.8 F (37.1 C) 98.3 F (36.8 C)  TempSrc:  Oral Oral Oral  SpO2: 98% 100% 96% 100%  Weight: 80.9 kg  81.3 kg   Height:       Physical Exam General: Awake, alert, on RA, NAD Heart: S1 and S2; No murmurs, gallops, or rubs Lungs: Clear bilaterally  Abdomen: Soft  non-tender Extremities: R BKA; L AKA; No edema bilateral stumps Dialysis Access: LUE AVF (+) B/T   Filed Weights   10/01/22 1430 10/01/22 1832 10/02/22 0609  Weight: 84.4 kg 80.9 kg 81.3 kg    Intake/Output Summary (Last 24 hours) at 10/02/2022 1322 Last data filed at 10/02/2022 0759 Gross per 24 hour  Intake 520.44 ml  Output 3500 ml  Net -2979.56 ml     Additional Objective Labs: Basic Metabolic Panel: Recent Labs  Lab 09/30/22 2300 10/01/22 1449 10/02/22 1008  NA 135 137 134*  K 4.7 4.2 3.7  CL 96* 95* 91*  CO2 '22 24 26  '$ GLUCOSE 167* 98 93  BUN 55* 36* 19  CREATININE 7.00* 6.25* 5.00*  CALCIUM 8.4* 8.4* 8.9  PHOS 8.4* 6.7* 5.8*    Liver Function Tests: Recent Labs  Lab 09/27/22 1746 09/29/22 1310 09/30/22 2300 10/01/22 1449 10/02/22 1008  AST 14* 12*  --   --   --   ALT 10 9  --   --   --   ALKPHOS 47 45  --   --   --   BILITOT 0.1* <0.1*  --   --   --   PROT 7.7 7.9  --   --   --   ALBUMIN 3.0* 2.9* 2.8* 2.9* 3.0*    No results for input(s): "LIPASE", "AMYLASE" in the last 168 hours. CBC: Recent Labs  Lab 09/27/22 1746 09/29/22 1310 09/30/22 0827 10/01/22 1449 10/02/22 1008  WBC 6.7 7.4 6.0 6.0 5.7  NEUTROABS  --  5.7  --  4.4  --   HGB 7.3* 6.7* 8.6* 8.1* 9.2*  HCT 23.0* 20.7* 26.4* 25.5* 28.4*  MCV 99.1 97.6  94.0 97.0 95.9  PLT 301 330 306 332 352    Blood Culture    Component Value Date/Time   SDES  06/25/2022 1638    BLOOD RIGHT HAND BOTTLES DRAWN AEROBIC AND ANAEROBIC Blood Culture results may not be optimal due to an excessive volume of blood received in culture bottles   SPECREQUEST NONE 06/25/2022 1638   CULT  06/25/2022 1638    NO GROWTH 5 DAYS Performed at [facility], [place] [address], [place], [place] [address]    REPTSTATUS 06/30/2022 FINAL 06/25/2022 1638    Cardiac Enzymes: No results for input(s): "CKTOTAL", "CKMB", "CKMBINDEX", "TROPONINI" in the last 168 hours. CBG: Recent Labs  Lab 10/01/22 1148 10/01/22 2039 10/01/22 2253 10/02/22 0756 10/02/22 1110  GLUCAP 114* 82 86 107* 90    Iron Studies: No results for input(s): "IRON", "TIBC", "TRANSFERRIN", "FERRITIN" in the last 72 hours. Lab Results  Component Value Date   INR 1.2 06/27/2022   INR 1.1 05/20/2021   INR  1.0 03/28/2021   Studies/Results: DG Thoracic Spine 2 View  Result Date: 10/01/2022 CLINICAL DATA:  Back pain for 2 days EXAM: THORACIC SPINE 2 VIEWS COMPARISON:  CT from 1 hour previous FINDINGS: Vertebral body height is well maintained similar to that seen on recent CT examination. No paraspinal mass lesion is noted. Pedicles are within normal limits. IMPRESSION: No acute abnormality noted in the thoracic spine. No change from the CT obtained 1 hour previous. Electronically Signed   By: Inez Catalina M.D.   On: 10/01/2022 22:57   CT CHEST WO CONTRAST  Result Date: 10/01/2022 CLINICAL DATA:  Severe right rib and chest pain and back pain. EXAM: CT CHEST WITHOUT CONTRAST TECHNIQUE: Multidetector CT imaging of the chest was performed following the standard protocol without IV contrast. RADIATION DOSE REDUCTION: This exam was performed according to the departmental dose-optimization program which includes automated exposure control, adjustment of the mA and/or kV according to patient size and/or use of  iterative reconstruction technique. COMPARISON:  Portable chest 09/29/2022; portable chest 09/27/2022; CT abdomen and pelvis without contrast 12/31/2018. FINDINGS: Cardiovascular: There is mild cardiomegaly. 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 No pericardial effusion. Coronary artery disease was not previously seen. There is age advanced diffuse aortic calcific disease, scattered calcification in the great vessels. There is no aortic aneurysm. The pulmonary arteries and veins are normal in caliber Mediastinum/Nodes: No enlarged mediastinal or axillary lymph nodes. Thyroid gland, trachea, and esophagus demonstrate no significant findings. There is a mildly elevated right hemidiaphragm chronically. Lungs/Pleura: There are increased linear atelectatic markings in both posterior basal lower lobes. There is increased bilateral lower lobe bronchial thickening. On the right there is additional posterior basal hazy opacity and band consolidation which be additional atelectasis or atelectasis intermixed with pneumonia. There is linear atelectasis in the base of the lingula and right middle lobe. Anteriorly in the right upper lobe there is a 4 mm noncalcified subpleural nodule on 8:43. There are no other visible nodules. Remaining lungs are clear.  No pleural effusion or pneumothorax. Upper Abdomen: Extensive vascular calcifications , increased from 12/31/2018. New bilateral renal atrophy. Musculoskeletal: No chest wall mass or suspicious bone lesions identified. An os acromiale is incidentally noted on the right. The visualized ribcage is intact. The lower ribcage is not fully included. IMPRESSION: 1. Mild cardiomegaly. 2. Age-advanced calcific CAD and aortic atherosclerosis not seen in 2020. 3. Bilateral lower lobe bronchial thickening consistent with bronchitis. 4. Right posterior basal hazy opacity and band consolidation which could be atelectasis or atelectasis intermixed with pneumonia. 5. 4 mm  right upper lobe nodule. Per Fleischner Society Guidelines, if patient is low risk for malignancy, no routine follow-up imaging is recommended. If patient is high risk for malignancy, a non-contrast Chest CT at 12 months is optional. If performed and the nodule is stable at 12 months, no further follow-up is recommended. These guidelines do not apply to immunocompromised patients and patients with cancer. Follow up in patients with significant comorbidities as clinically warranted. For lung cancer screening, adhere to Lung-RADS guidelines. Reference: Radiology. 2017; 284(1):228-43. 6. New bilateral renal atrophy. Extensive upper abdominal vascular calcifications. 7. The lower ribcage is not fully included. The visualized ribcage is intact. Aortic Atherosclerosis (ICD10-I70.0). Electronically Signed   By: Telford Nab M.D.   On: 10/01/2022 21:53    Medications:  sodium chloride Stopped (10/02/22 0108)    sodium chloride   Intravenous Once   acetaminophen  1,000 mg Oral Q8H   amLODipine  10 mg Oral  Daily   aspirin EC  81 mg Oral Daily   atorvastatin  80 mg Oral Daily   benzonatate  100 mg Oral Once   bictegravir-emtricitabine-tenofovir AF  1 tablet Oral Daily   calcitRIOL  1 mcg Oral Q T,Th,Sa-HD   calcium acetate  1,334 mg Oral TID WC   Chlorhexidine Gluconate Cloth  6 each Topical Q0600   Chlorhexidine Gluconate Cloth  6 each Topical Q0600   gabapentin  100 mg Oral QHS   heparin  5,000 Units Subcutaneous Q8H   insulin aspart  0-5 Units Subcutaneous QHS   insulin aspart  0-6 Units Subcutaneous TID WC   insulin glargine-yfgn  15 Units Subcutaneous QHS   irbesartan  300 mg Oral Daily   lidocaine  1 patch Transdermal Q24H    Dialysis Orders: TTS [facility] 4h  400/1.5    78kg  2K/ 3Ca bath  LUA AVF  Hep none - last HD 2/20, post wt 83.7kg; coming off 3-5kg over the last 1-2 wks - rocaltrol 1.0 mcg po tiw - venofer '100mg'$  tiw thru 3/14 - mircera 200 mcg q 2, last 2/15, due 2/29 - last Hb 6.6 on  2/20  Home meds include - albuterol, amlodipine/ olmesartan, biktarvy, rocaltrol, norvasc 10, lipitor, phoslo 2 ac tid, aspirin, prns/ vits/ supps   Assessment/Plan: PNA/Rib pain - IV antibiotics per primary team.  SOB - improving. Not to EDW yet. Continue UF as tolerated. Marland Kitchen  ESRD - on HD TTS. Next HD Sat - will write orders if here tomorrow.   HTN/ volume - as above. BP's high. 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 Hb 9.2. ESA due 2/29.  MBD ckd - CCa in range and PO4 is high. Cont po vdra and phoslo as binder. HIV DM2  Latasha Buczkowski Larina Earthly PA-C [facility] 10/02/2022,1:22 PM

## 2022-10-03 ENCOUNTER — Other Ambulatory Visit: Payer: Self-pay

## 2022-10-03 ENCOUNTER — Emergency Department (HOSPITAL_COMMUNITY): Payer: 59

## 2022-10-03 ENCOUNTER — Encounter (HOSPITAL_COMMUNITY): Payer: Self-pay

## 2022-10-03 ENCOUNTER — Emergency Department (HOSPITAL_COMMUNITY)
Admission: EM | Admit: 2022-10-03 | Discharge: 2022-10-03 | Disposition: A | Discharge status: Home / Self Care | Payer: 59 | Attending: Emergency Medicine | Admitting: Emergency Medicine

## 2022-10-03 DIAGNOSIS — I120 Hypertensive chronic kidney disease with stage 5 chronic kidney disease or end stage renal disease: Secondary | ICD-10-CM | POA: Diagnosis not present

## 2022-10-03 DIAGNOSIS — N185 Chronic kidney disease, stage 5: Secondary | ICD-10-CM | POA: Diagnosis not present

## 2022-10-03 DIAGNOSIS — Z7982 Long term (current) use of aspirin: Secondary | ICD-10-CM | POA: Insufficient documentation

## 2022-10-03 DIAGNOSIS — D631 Anemia in chronic kidney disease: Secondary | ICD-10-CM | POA: Diagnosis not present

## 2022-10-03 DIAGNOSIS — R197 Diarrhea, unspecified: Secondary | ICD-10-CM | POA: Diagnosis not present

## 2022-10-03 DIAGNOSIS — R0781 Pleurodynia: Secondary | ICD-10-CM | POA: Diagnosis not present

## 2022-10-03 DIAGNOSIS — R0789 Other chest pain: Secondary | ICD-10-CM | POA: Diagnosis not present

## 2022-10-03 DIAGNOSIS — Z992 Dependence on renal dialysis: Secondary | ICD-10-CM

## 2022-10-03 DIAGNOSIS — Z79899 Other long term (current) drug therapy: Secondary | ICD-10-CM | POA: Insufficient documentation

## 2022-10-03 DIAGNOSIS — I10 Essential (primary) hypertension: Secondary | ICD-10-CM | POA: Diagnosis not present

## 2022-10-03 DIAGNOSIS — N186 End stage renal disease: Secondary | ICD-10-CM | POA: Diagnosis not present

## 2022-10-03 DIAGNOSIS — R079 Chest pain, unspecified: Secondary | ICD-10-CM

## 2022-10-03 DIAGNOSIS — N2581 Secondary hyperparathyroidism of renal origin: Secondary | ICD-10-CM | POA: Diagnosis not present

## 2022-10-03 DIAGNOSIS — R519 Headache, unspecified: Secondary | ICD-10-CM | POA: Diagnosis not present

## 2022-10-03 LAB — I-STAT BETA HCG BLOOD, ED (MC, WL, AP ONLY): I-stat hCG, quantitative: <5 m[IU]/mL (ref <5)

## 2022-10-03 LAB — CBC
HCT: 30.1 % — ABNORMAL LOW (ref 36.0–46.0)
Hemoglobin: 9.9 g/dL — ABNORMAL LOW (ref 12.0–15.0)
MCH: 31.2 pg (ref 26.0–34.0)
MCHC: 32.9 g/dL (ref 30.0–36.0)
MCV: 95 fL (ref 80.0–100.0)
Platelets: 380 10*3/uL (ref 150–400)
RBC: 3.17 MIL/uL — ABNORMAL LOW (ref 3.87–5.11)
RDW: 15.3 % (ref 11.5–15.5)
WBC: 6.7 10*3/uL (ref 4.0–10.5)
nRBC: 0 % (ref 0.0–0.2)

## 2022-10-03 LAB — BASIC METABOLIC PANEL
Anion gap: 19 — ABNORMAL HIGH (ref 5–15)
BUN: 22 mg/dL — ABNORMAL HIGH (ref 6–20)
CO2: 24 mmol/L (ref 22–32)
Calcium: 9.4 mg/dL (ref 8.9–10.3)
Chloride: 91 mmol/L — ABNORMAL LOW (ref 98–111)
Creatinine, Ser: 5.18 mg/dL — ABNORMAL HIGH (ref 0.44–1.00)
GFR, Estimated: 10 mL/min — ABNORMAL LOW (ref >60)
Glucose, Bld: 157 mg/dL — ABNORMAL HIGH (ref 70–99)
Potassium: 3.8 mmol/L (ref 3.5–5.1)
Sodium: 134 mmol/L — ABNORMAL LOW (ref 135–145)

## 2022-10-03 LAB — TROPONIN I (HIGH SENSITIVITY)
Troponin I (High Sensitivity): 37 ng/L — ABNORMAL HIGH (ref <18)
Troponin I (High Sensitivity): 32 ng/L — ABNORMAL HIGH (ref <18)

## 2022-10-03 LAB — CBG MONITORING, ED: Glucose-Capillary: 146 mg/dL — ABNORMAL HIGH (ref 70–99)

## 2022-10-03 MED ORDER — HYDROMORPHONE HCL 1 MG/ML IJ SOLN
1.0000 mg | Freq: Once | INTRAMUSCULAR | Status: AC
  Administered 2022-10-03: 1 mg via INTRAMUSCULAR
  Filled 2022-10-03: qty 1

## 2022-10-03 MED ORDER — OXYCODONE HCL 5 MG PO TABS: 5.0000 mg | ORAL_TABLET | Freq: Four times a day (QID) | ORAL | 0 refills | Status: DC | PRN

## 2022-10-03 MED ORDER — HYDROMORPHONE HCL 1 MG/ML IJ SOLN: 1.0000 mg | Freq: Once | INTRAMUSCULAR | Status: DC

## 2022-10-03 NOTE — ED Triage Notes (Signed)
Patient reports rib pain since last night. Recent diagnosis of pneumonia. Coming from dialysis, only got 1 hour of treatment, had 1L of fluid taken off

## 2022-10-03 NOTE — Discharge Instructions (Signed)
Take the medications as needed.  Follow-up with your doctor to be rechecked.  Return as needed for worsening symptoms

## 2022-10-03 NOTE — ED Provider Notes (Signed)
[facility] Provider Note   CSN: [id number] Arrival date & time: 10/03/22  [id number]     History  Chief Complaint  Patient presents with   Chest Pain    Kathy Frank is a 47 y.o. female.   Chest Pain    Patient has a history of diabetes, HIV, hypertension, chronic renal failure requiring dialysis, anxiety, coronary artery disease, hypertensive emergency, non-ST elevation MI who presents to the ED with complaints of chest pain.  Patient states she was in the hospital recently for the same complaint.  Records indicate she was actually discharged yesterday.  Patient was at dialysis today when she started having sharp pain in her chest again.  Patient states it never really has gone away.  It increases when she moves, pushes on her chest or takes deep breath.  She denies any fevers.  No shortness of breath.  Home Medications Prior to Admission medications   Medication Sig Start Date End Date Taking? Authorizing Provider  oxyCODONE (ROXICODONE) 5 MG immediate release tablet Take 1 tablet (5 mg total) by mouth every 6 (six) hours as needed for severe pain. 10/03/22  Yes Dorie Rank, MD  Accu-Chek Softclix Lancets lancets Use as instructed 05/02/21   Atway, Rayann N, DO  albuterol (VENTOLIN HFA) 108 (90 Base) MCG/ACT inhaler Inhale 2 puffs into the lungs every 4 (four) hours as needed for wheezing or shortness of breath. 02/14/21   Angiulli, Lavon Paganini, PA-C  amLODipine-olmesartan (AZOR) 10-40 MG tablet Take 1 tablet by mouth every evening. 10/02/22 11/01/22  Delene Ruffini, MD  atorvastatin (LIPITOR) 80 MG tablet Take 1 tablet (80 mg total) by mouth daily. Patient not taking: Reported on 06/28/2022 05/24/21   Masters, Joellen Jersey, DO  bictegravir-emtricitabine-tenofovir AF (BIKTARVY) 50-200-25 MG TABS tablet Take 1 tablet by mouth daily. OVERDUE FOR OFFICE FOLLOW UP - PLEASE CALL OUR OFFICE TO SCHEDULE [phone] 08/26/22   Michel Bickers, MD  calcitRIOL  (ROCALTROL) 0.25 MCG capsule Take 5 capsules (1.25 mcg total) by mouth Every Tuesday,Thursday,and Saturday with dialysis. 07/02/22   Rick Duff, MD  calcium acetate (PHOSLO) 667 MG capsule Take 2 capsules (1,334 mg total) by mouth 3 (three) times daily with meals. Patient taking differently: Take 2,001 mg by mouth 3 (three) times daily with meals. 06/30/22   Rick Duff, MD  Continuous Blood Gluc Receiver (DEXCOM G7 RECEIVER) DEVI Use to check blood sugars cntinuously 06/30/22   Rick Duff, MD  Continuous Blood Gluc Sensor (DEXCOM G7 SENSOR) MISC Use to check blood sugar continuously 06/30/22   Rick Duff, MD  cyclobenzaprine (FLEXERIL) 5 MG tablet Take 1 tablet (5 mg total) by mouth 2 (two) times daily as needed for muscle spasms. 10/02/22 11/01/22  Delene Ruffini, MD  Darbepoetin Alfa (ARANESP) 200 MCG/0.4ML SOSY injection Inject 0.4 mLs (200 mcg total) into the skin every Monday at 6 PM. 07/06/22   Rick Duff, MD  diclofenac Sodium (VOLTAREN) 1 % GEL Apply 4 g topically 4 (four) times daily. 10/02/22   Delene Ruffini, MD  EQ ASPIRIN ADULT LOW DOSE 81 MG tablet TAKE 1 TABLET BY MOUTH ONCE DAILY. SWALLOW WHOLE. Patient not taking: Reported on 09/29/2022 02/06/22   Waynetta Sandy, MD  gabapentin (NEURONTIN) 100 MG capsule Take 100 mg by mouth at bedtime. 09/10/22   [provider]  glucose blood test strip Use as instructed 05/02/21   Atway, Rayann N, DO  insulin glargine (LANTUS SOLOSTAR) 100 UNIT/ML Solostar Pen Inject 15 Units into the  skin at bedtime. 06/30/22   Rick Duff, MD  Insulin Pen Needle (PENTIPS) 32G X 4 MM MISC Use as directed 02/14/21   Meredith Staggers, MD  lidocaine (LIDODERM) 5 % Place 1 patch onto the skin daily. Remove & Discard patch within 12 hours or as directed by MD 10/03/22   Delene Ruffini, MD  liraglutide (VICTOZA) 18 MG/3ML SOPN Inject 0.6 mg into the skin daily. 06/30/22 06/25/23  Rick Duff, MD  [facility]  5 g packet Take 5 g by mouth daily. 07/30/22   [provider]  multivitamin (RENA-VIT) TABS tablet Take 1 tablet by mouth daily. Patient taking differently: Take 1 tablet by mouth See admin instructions. On Tuesday, Wednesday and Thursday 02/14/21   Angiulli, Lavon Paganini, PA-C  ondansetron (ZOFRAN) 4 MG tablet Take 1 tablet (4 mg total) by mouth every 8 (eight) hours as needed for up to 10 days for nausea or vomiting. 10/02/22 10/12/22  Delene Ruffini, MD  polyethylene glycol (MIRALAX / GLYCOLAX) 17 g packet Take 17 g by mouth daily. Patient taking differently: Take 17 g by mouth daily as needed for moderate constipation or mild constipation. 07/01/22   Rick Duff, MD      Allergies    Patient has no known allergies.    Review of Systems   Review of Systems  Cardiovascular:  Positive for chest pain.    Physical Exam Updated Vital Signs BP (!) 176/81 (BP Location: Right Arm)   Pulse 83   Temp 98 F (36.7 C) (Oral)   Resp 11   LMP  (LMP Unknown)   SpO2 98%  Physical Exam Vitals and nursing note reviewed.  Constitutional:      Appearance: She is well-developed. She is not diaphoretic.     Comments: Appears to be in pain, chronically ill-appearing  HENT:     Head: Normocephalic and atraumatic.     Right Ear: External ear normal.     Left Ear: External ear normal.  Eyes:     General: No scleral icterus.       Right eye: No discharge.        Left eye: No discharge.     Conjunctiva/sclera: Conjunctivae normal.  Neck:     Trachea: No tracheal deviation.  Cardiovascular:     Rate and Rhythm: Normal rate and regular rhythm.  Pulmonary:     Effort: Pulmonary effort is normal. No respiratory distress.     Breath sounds: Normal breath sounds. No stridor. No wheezing or rales.  Chest:     Chest wall: Tenderness present.  Abdominal:     General: Bowel sounds are normal. There is no distension.     Palpations: Abdomen is soft.     Tenderness: There is no abdominal  tenderness. There is no guarding or rebound.  Musculoskeletal:        General: No tenderness or deformity.     Cervical back: Neck supple.     Comments: AV fistula left upper extremity, no erythema  Skin:    General: Skin is warm and dry.     Findings: No rash.  Neurological:     General: No focal deficit present.     Mental Status: She is alert.     Cranial Nerves: No cranial nerve deficit, dysarthria or facial asymmetry.     Sensory: No sensory deficit.     Motor: No abnormal muscle tone or seizure activity.     Coordination: Coordination normal.  Psychiatric:  Mood and Affect: Mood normal.     ED Results / Procedures / Treatments   Labs (all labs ordered are listed, but only abnormal results are displayed) Labs Reviewed  BASIC METABOLIC PANEL - Abnormal; Notable for the following components:      Result Value   Sodium 134 (*)    Chloride 91 (*)    Glucose, Bld 157 (*)    BUN 22 (*)    Creatinine, Ser 5.18 (*)    GFR, Estimated 10 (*)    Anion gap 19 (*)    All other components within normal limits  CBC - Abnormal; Notable for the following components:   RBC 3.17 (*)    Hemoglobin 9.9 (*)    HCT 30.1 (*)    All other components within normal limits  CBG MONITORING, ED - Abnormal; Notable for the following components:   Glucose-Capillary 146 (*)    All other components within normal limits  TROPONIN I (HIGH SENSITIVITY) - Abnormal; Notable for the following components:   Troponin I (High Sensitivity) 37 (*)    All other components within normal limits  TROPONIN I (HIGH SENSITIVITY) - Abnormal; Notable for the following components:   Troponin I (High Sensitivity) 32 (*)    All other components within normal limits  I-STAT BETA HCG BLOOD, ED ([facility], [facility], [facility] ONLY)    EKG EKG Interpretation  Date/Time:  Saturday October 03 2022 09:58:40 EST Ventricular Rate:  84 PR Interval:  160 QRS Duration: 94 QT Interval:  404 QTC Calculation: 478 R Axis:   29 Text  Interpretation: Sinus rhythm Probable left atrial enlargement Probable left ventricular hypertrophy ST elev, probable normal early repol pattern No significant change since last tracing Confirmed by Dorie Rank [phone]) on 10/03/2022 10:20:35 AM  Radiology DG Chest Portable 1 View  Result Date: 10/03/2022 CLINICAL DATA:  Chest pain. EXAM: PORTABLE CHEST 1 VIEW COMPARISON:  09/29/2022 FINDINGS: Stable mild cardiomegaly. Aortic atherosclerotic calcification incidentally noted. Both lungs are clear. IMPRESSION: Stable mild cardiomegaly. No active lung disease. Electronically Signed   By: Marlaine Hind M.D.   On: 10/03/2022 10:51   DG Thoracic Spine 2 View  Result Date: 10/01/2022 CLINICAL DATA:  Back pain for 2 days EXAM: THORACIC SPINE 2 VIEWS COMPARISON:  CT from 1 hour previous FINDINGS: Vertebral body height is well maintained similar to that seen on recent CT examination. No paraspinal mass lesion is noted. Pedicles are within normal limits. IMPRESSION: No acute abnormality noted in the thoracic spine. No change from the CT obtained 1 hour previous. Electronically Signed   By: Inez Catalina M.D.   On: 10/01/2022 22:57   CT CHEST WO CONTRAST  Result Date: 10/01/2022 CLINICAL DATA:  Severe right rib and chest pain and back pain. EXAM: CT CHEST WITHOUT CONTRAST TECHNIQUE: Multidetector CT imaging of the chest was performed following the standard protocol without IV contrast. RADIATION DOSE REDUCTION: This exam was performed according to the departmental dose-optimization program which includes automated exposure control, adjustment of the mA and/or kV according to patient size and/or use of iterative reconstruction technique. COMPARISON:  Portable chest 09/29/2022; portable chest 09/27/2022; CT abdomen and pelvis without contrast 12/31/2018. FINDINGS: Cardiovascular: There is mild cardiomegaly. 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 No pericardial effusion. Coronary artery disease was not  previously seen. There is age advanced diffuse aortic calcific disease, scattered calcification in the great vessels. There is no aortic aneurysm. The pulmonary arteries and veins are normal in caliber Mediastinum/Nodes: No enlarged mediastinal  or axillary lymph nodes. Thyroid gland, trachea, and esophagus demonstrate no significant findings. There is a mildly elevated right hemidiaphragm chronically. Lungs/Pleura: There are increased linear atelectatic markings in both posterior basal lower lobes. There is increased bilateral lower lobe bronchial thickening. On the right there is additional posterior basal hazy opacity and band consolidation which be additional atelectasis or atelectasis intermixed with pneumonia. There is linear atelectasis in the base of the lingula and right middle lobe. Anteriorly in the right upper lobe there is a 4 mm noncalcified subpleural nodule on 8:43. There are no other visible nodules. Remaining lungs are clear.  No pleural effusion or pneumothorax. Upper Abdomen: Extensive vascular calcifications , increased from 12/31/2018. New bilateral renal atrophy. Musculoskeletal: No chest wall mass or suspicious bone lesions identified. An os acromiale is incidentally noted on the right. The visualized ribcage is intact. The lower ribcage is not fully included. IMPRESSION: 1. Mild cardiomegaly. 2. Age-advanced calcific CAD and aortic atherosclerosis not seen in 2020. 3. Bilateral lower lobe bronchial thickening consistent with bronchitis. 4. Right posterior basal hazy opacity and band consolidation which could be atelectasis or atelectasis intermixed with pneumonia. 5. 4 mm right upper lobe nodule. Per Fleischner Society Guidelines, if patient is low risk for malignancy, no routine follow-up imaging is recommended. If patient is high risk for malignancy, a non-contrast Chest CT at 12 months is optional. If performed and the nodule is stable at 12 months, no further follow-up is recommended.  These guidelines do not apply to immunocompromised patients and patients with cancer. Follow up in patients with significant comorbidities as clinically warranted. For lung cancer screening, adhere to Lung-RADS guidelines. Reference: Radiology. 2017; 284(1):228-43. 6. New bilateral renal atrophy. Extensive upper abdominal vascular calcifications. 7. The lower ribcage is not fully included. The visualized ribcage is intact. Aortic Atherosclerosis (ICD10-I70.0). Electronically Signed   By: Telford Nab M.D.   On: 10/01/2022 21:53    Procedures Procedures    Medications Ordered in ED Medications  HYDROmorphone (DILAUDID) injection 1 mg (1 mg Intramuscular Given 10/03/22 1035)    ED Course/ Medical Decision Making/ A&P Clinical Course as of 10/03/22 1447  Sat Oct 03, 2022  1112 Chest x-ray without acute findings [JK]  1134 CBC(!) Anemia stable [JK]  1235 Troponin I (High Sensitivity)(!) Troponin elevated at 37.  Unchanged from previous values [JK]  [id number] Basic metabolic panel(!) Metabolic panel consistent with her known renal failure [JK]    Clinical Course User Index [JK] Dorie Rank, MD                             Medical Decision Making Problems Addressed: Chest pain, unspecified type: acute illness or injury that poses a threat to life or bodily functions Stage 5 chronic kidney disease on chronic dialysis [facility]): chronic illness or injury  Amount and/or Complexity of Data Reviewed Labs: ordered. Decision-making details documented in ED Course. Radiology: ordered.  Risk Prescription drug management.  Records reviewed from patient's recent hospitalization.  She did have a CT scan without contrast performed on February 22.  It did show findings suggest possible atelectasis or pneumonia.  Lung nodule noted.  Coronary artery disease noted.  Patient's chest pain felt to be most likely musculoskeletal in nature  Patient presented with persistent pain today.  Pain primarily in the  right rib area.  Patient is afebrile.  No respiratory difficulty.  ED workup without signs of pneumonia or pneumothorax.  I doubt acute vascular  etiology.  She did recently have a CT scan.  Serial troponins show stable mild elevation, not consistent with acute coronary syndrome and likely related to her chronic kidney disease.  I suspect her symptoms may be musculoskeletal in nature.  Patient improved with treatment.  Stable for discharge outpatient follow-up.       Final Clinical Impression(s) / ED Diagnoses Final diagnoses:  Chest pain, unspecified type  Stage 5 chronic kidney disease on chronic dialysis [facility])    Rx / DC Orders ED Discharge Orders          Ordered    oxyCODONE (ROXICODONE) 5 MG immediate release tablet  Every 6 hours PRN        10/03/22 1444              Dorie Rank, MD 10/03/22 1447

## 2022-10-04 ENCOUNTER — Telehealth: Payer: Self-pay | Admitting: Nephrology

## 2022-10-04 NOTE — Telephone Encounter (Signed)
Transition of Care - Initial Contact from Inpatient Facility  Date of discharge: 10/02/22 Date of contact: 10/04/22  Method: Phone Spoke to: Patient  Patient contacted to discuss transition of care from recent inpatient hospitalization. Patient was admitted to [facility] from 2/20-2/23/24 with discharge diagnosis of PNA/Rib pain.   The discharge medication list was reviewed.   Patient will return to his/her outpatient HD unit on: She went to dialysis on Sat 2/24 then started having chest pains and brought to the ED for evaluation. No acute findings. Pain improved with medication. Feels better today. No concerns.

## 2022-10-06 ENCOUNTER — Ambulatory Visit: Payer: 59

## 2022-10-06 DIAGNOSIS — N2581 Secondary hyperparathyroidism of renal origin: Secondary | ICD-10-CM | POA: Diagnosis not present

## 2022-10-06 DIAGNOSIS — Z992 Dependence on renal dialysis: Secondary | ICD-10-CM | POA: Diagnosis not present

## 2022-10-06 DIAGNOSIS — R519 Headache, unspecified: Secondary | ICD-10-CM | POA: Diagnosis not present

## 2022-10-06 DIAGNOSIS — N186 End stage renal disease: Secondary | ICD-10-CM | POA: Diagnosis not present

## 2022-10-06 DIAGNOSIS — R197 Diarrhea, unspecified: Secondary | ICD-10-CM | POA: Diagnosis not present

## 2022-10-06 DIAGNOSIS — D631 Anemia in chronic kidney disease: Secondary | ICD-10-CM | POA: Diagnosis not present

## 2022-10-06 NOTE — Progress Notes (Unsigned)
DATE: 09/22/2022  TIME: 130pm  NAME: Kathy Frank  SERVICE: Psychotherapy  DIAGNOSIS: Major Depressive Disorder, Recurrent Episodes, Moderate  OBJECTIVE:   Mood: Euthymic and Affect: Appropriate  Risk of harm to self or others: No plan to harm self or others  GOALS ADDRESSED:  Patient will: 1. Reduce symptoms of: mood instability  2. Decrease symptoms of Anxiety/Depression 3. Increase knowledge and/or ability of: coping skills  4. Demonstrate ability to: Increase healthy adjustment to current life circumstances   INTERVENTIONS:  Therapist met with client as scheduled and discussed their progress. Therapist used active listening skills to provide client with opportunity to disclose previous challenges and successes since last session. Specific problem-solving skills were processed with client, including breaking down problems, brainstorming, evaluating, and choosing options. At times implementing a plan and evaluating and reevaluating results. Therapist assessed for SI/HI during session and will follow-up with client during the next session.    EFFECTIVENESS/PLAN:   Client met with therapist as scheduled and presented alert; orient X4. At the start of session, client affect appeared sullen; affect appeared to be congruent with client report. Client expressed their needs in the development of their treatment goals and described different communities of which can serve as an added support. Client reported that she continues to be stressed out due to the condition of her heath (missing Legs and her HIV status). Client and clinician processed the advantages of opening up about her status vs. the disadvantage. Client progress toward therapeutic goal(s) remain minimal at this time. Client denied SI/HI.

## 2022-10-08 ENCOUNTER — Other Ambulatory Visit: Payer: Self-pay

## 2022-10-08 ENCOUNTER — Other Ambulatory Visit (HOSPITAL_COMMUNITY): Payer: Self-pay

## 2022-10-08 DIAGNOSIS — N186 End stage renal disease: Secondary | ICD-10-CM | POA: Diagnosis not present

## 2022-10-08 DIAGNOSIS — E1129 Type 2 diabetes mellitus with other diabetic kidney complication: Secondary | ICD-10-CM | POA: Diagnosis not present

## 2022-10-08 DIAGNOSIS — D631 Anemia in chronic kidney disease: Secondary | ICD-10-CM | POA: Diagnosis not present

## 2022-10-08 DIAGNOSIS — Z992 Dependence on renal dialysis: Secondary | ICD-10-CM | POA: Diagnosis not present

## 2022-10-08 DIAGNOSIS — R197 Diarrhea, unspecified: Secondary | ICD-10-CM | POA: Diagnosis not present

## 2022-10-08 DIAGNOSIS — N2581 Secondary hyperparathyroidism of renal origin: Secondary | ICD-10-CM | POA: Diagnosis not present

## 2022-10-08 DIAGNOSIS — R519 Headache, unspecified: Secondary | ICD-10-CM | POA: Diagnosis not present

## 2022-10-10 DIAGNOSIS — N186 End stage renal disease: Secondary | ICD-10-CM | POA: Diagnosis not present

## 2022-10-10 DIAGNOSIS — N2581 Secondary hyperparathyroidism of renal origin: Secondary | ICD-10-CM | POA: Diagnosis not present

## 2022-10-10 DIAGNOSIS — R519 Headache, unspecified: Secondary | ICD-10-CM | POA: Diagnosis not present

## 2022-10-10 DIAGNOSIS — D631 Anemia in chronic kidney disease: Secondary | ICD-10-CM | POA: Diagnosis not present

## 2022-10-10 DIAGNOSIS — Z992 Dependence on renal dialysis: Secondary | ICD-10-CM | POA: Diagnosis not present

## 2022-10-10 DIAGNOSIS — I120 Hypertensive chronic kidney disease with stage 5 chronic kidney disease or end stage renal disease: Secondary | ICD-10-CM | POA: Diagnosis not present

## 2022-10-12 ENCOUNTER — Encounter: Payer: Self-pay | Admitting: Student

## 2022-10-12 ENCOUNTER — Encounter (HOSPITAL_COMMUNITY): Payer: Self-pay

## 2022-10-12 ENCOUNTER — Emergency Department (HOSPITAL_COMMUNITY): Payer: 59

## 2022-10-12 ENCOUNTER — Other Ambulatory Visit: Payer: Self-pay

## 2022-10-12 ENCOUNTER — Inpatient Hospital Stay (HOSPITAL_COMMUNITY)
Admission: EM | Admit: 2022-10-12 | Discharge: 2022-10-16 | DRG: 377 | Disposition: A | Discharge status: Home / Self Care | Payer: 59 | Attending: Internal Medicine | Admitting: Internal Medicine

## 2022-10-12 DIAGNOSIS — E785 Hyperlipidemia, unspecified: Secondary | ICD-10-CM | POA: Diagnosis not present

## 2022-10-12 DIAGNOSIS — E875 Hyperkalemia: Secondary | ICD-10-CM | POA: Diagnosis present

## 2022-10-12 DIAGNOSIS — R0602 Shortness of breath: Secondary | ICD-10-CM | POA: Diagnosis not present

## 2022-10-12 DIAGNOSIS — R079 Chest pain, unspecified: Secondary | ICD-10-CM | POA: Diagnosis not present

## 2022-10-12 DIAGNOSIS — K254 Chronic or unspecified gastric ulcer with hemorrhage: Secondary | ICD-10-CM | POA: Diagnosis not present

## 2022-10-12 DIAGNOSIS — Z992 Dependence on renal dialysis: Secondary | ICD-10-CM

## 2022-10-12 DIAGNOSIS — B20 Human immunodeficiency virus [HIV] disease: Secondary | ICD-10-CM | POA: Diagnosis present

## 2022-10-12 DIAGNOSIS — E1122 Type 2 diabetes mellitus with diabetic chronic kidney disease: Secondary | ICD-10-CM | POA: Diagnosis present

## 2022-10-12 DIAGNOSIS — Z89612 Acquired absence of left leg above knee: Secondary | ICD-10-CM | POA: Diagnosis not present

## 2022-10-12 DIAGNOSIS — E8729 Other acidosis: Secondary | ICD-10-CM | POA: Diagnosis not present

## 2022-10-12 DIAGNOSIS — R410 Disorientation, unspecified: Secondary | ICD-10-CM | POA: Diagnosis not present

## 2022-10-12 DIAGNOSIS — Z79899 Other long term (current) drug therapy: Secondary | ICD-10-CM | POA: Diagnosis not present

## 2022-10-12 DIAGNOSIS — D62 Acute posthemorrhagic anemia: Secondary | ICD-10-CM | POA: Diagnosis present

## 2022-10-12 DIAGNOSIS — G4489 Other headache syndrome: Secondary | ICD-10-CM | POA: Diagnosis not present

## 2022-10-12 DIAGNOSIS — E872 Acidosis, unspecified: Secondary | ICD-10-CM | POA: Diagnosis not present

## 2022-10-12 DIAGNOSIS — G9349 Other encephalopathy: Secondary | ICD-10-CM | POA: Diagnosis not present

## 2022-10-12 DIAGNOSIS — R7989 Other specified abnormal findings of blood chemistry: Secondary | ICD-10-CM | POA: Diagnosis present

## 2022-10-12 DIAGNOSIS — G43909 Migraine, unspecified, not intractable, without status migrainosus: Secondary | ICD-10-CM | POA: Diagnosis present

## 2022-10-12 DIAGNOSIS — E1143 Type 2 diabetes mellitus with diabetic autonomic (poly)neuropathy: Secondary | ICD-10-CM | POA: Diagnosis present

## 2022-10-12 DIAGNOSIS — K3184 Gastroparesis: Secondary | ICD-10-CM | POA: Diagnosis present

## 2022-10-12 DIAGNOSIS — I10 Essential (primary) hypertension: Secondary | ICD-10-CM | POA: Diagnosis not present

## 2022-10-12 DIAGNOSIS — Z794 Long term (current) use of insulin: Secondary | ICD-10-CM | POA: Diagnosis not present

## 2022-10-12 DIAGNOSIS — T39395A Adverse effect of other nonsteroidal anti-inflammatory drugs [NSAID], initial encounter: Secondary | ICD-10-CM | POA: Diagnosis present

## 2022-10-12 DIAGNOSIS — K922 Gastrointestinal hemorrhage, unspecified: Secondary | ICD-10-CM | POA: Diagnosis not present

## 2022-10-12 DIAGNOSIS — D649 Anemia, unspecified: Secondary | ICD-10-CM | POA: Diagnosis not present

## 2022-10-12 DIAGNOSIS — N2581 Secondary hyperparathyroidism of renal origin: Secondary | ICD-10-CM | POA: Diagnosis present

## 2022-10-12 DIAGNOSIS — I959 Hypotension, unspecified: Secondary | ICD-10-CM | POA: Diagnosis present

## 2022-10-12 DIAGNOSIS — Z833 Family history of diabetes mellitus: Secondary | ICD-10-CM

## 2022-10-12 DIAGNOSIS — K219 Gastro-esophageal reflux disease without esophagitis: Secondary | ICD-10-CM | POA: Diagnosis present

## 2022-10-12 DIAGNOSIS — K5900 Constipation, unspecified: Secondary | ICD-10-CM | POA: Diagnosis present

## 2022-10-12 DIAGNOSIS — K250 Acute gastric ulcer with hemorrhage: Secondary | ICD-10-CM | POA: Diagnosis not present

## 2022-10-12 DIAGNOSIS — G9341 Metabolic encephalopathy: Secondary | ICD-10-CM | POA: Diagnosis not present

## 2022-10-12 DIAGNOSIS — E113393 Type 2 diabetes mellitus with moderate nonproliferative diabetic retinopathy without macular edema, bilateral: Secondary | ICD-10-CM | POA: Diagnosis present

## 2022-10-12 DIAGNOSIS — I2510 Atherosclerotic heart disease of native coronary artery without angina pectoris: Secondary | ICD-10-CM | POA: Diagnosis present

## 2022-10-12 DIAGNOSIS — I120 Hypertensive chronic kidney disease with stage 5 chronic kidney disease or end stage renal disease: Secondary | ICD-10-CM | POA: Diagnosis not present

## 2022-10-12 DIAGNOSIS — K3189 Other diseases of stomach and duodenum: Secondary | ICD-10-CM | POA: Diagnosis present

## 2022-10-12 DIAGNOSIS — I700 Atherosclerosis of aorta: Secondary | ICD-10-CM | POA: Diagnosis not present

## 2022-10-12 DIAGNOSIS — K2100 Gastro-esophageal reflux disease with esophagitis, without bleeding: Secondary | ICD-10-CM | POA: Diagnosis present

## 2022-10-12 DIAGNOSIS — Z89511 Acquired absence of right leg below knee: Secondary | ICD-10-CM | POA: Diagnosis not present

## 2022-10-12 DIAGNOSIS — Z1152 Encounter for screening for COVID-19: Secondary | ICD-10-CM | POA: Diagnosis not present

## 2022-10-12 DIAGNOSIS — N186 End stage renal disease: Secondary | ICD-10-CM | POA: Diagnosis present

## 2022-10-12 DIAGNOSIS — Z7982 Long term (current) use of aspirin: Secondary | ICD-10-CM

## 2022-10-12 DIAGNOSIS — M898X9 Other specified disorders of bone, unspecified site: Secondary | ICD-10-CM | POA: Diagnosis present

## 2022-10-12 DIAGNOSIS — E1151 Type 2 diabetes mellitus with diabetic peripheral angiopathy without gangrene: Secondary | ICD-10-CM | POA: Diagnosis present

## 2022-10-12 DIAGNOSIS — Z810 Family history of intellectual disabilities: Secondary | ICD-10-CM

## 2022-10-12 DIAGNOSIS — R0789 Other chest pain: Secondary | ICD-10-CM | POA: Diagnosis not present

## 2022-10-12 DIAGNOSIS — F32A Depression, unspecified: Secondary | ICD-10-CM | POA: Diagnosis present

## 2022-10-12 DIAGNOSIS — Z1151 Encounter for screening for human papillomavirus (HPV): Secondary | ICD-10-CM

## 2022-10-12 DIAGNOSIS — Z87891 Personal history of nicotine dependence: Secondary | ICD-10-CM

## 2022-10-12 DIAGNOSIS — Z8673 Personal history of transient ischemic attack (TIA), and cerebral infarction without residual deficits: Secondary | ICD-10-CM

## 2022-10-12 DIAGNOSIS — D500 Iron deficiency anemia secondary to blood loss (chronic): Secondary | ICD-10-CM | POA: Diagnosis not present

## 2022-10-12 DIAGNOSIS — Z7985 Long-term (current) use of injectable non-insulin antidiabetic drugs: Secondary | ICD-10-CM

## 2022-10-12 DIAGNOSIS — K921 Melena: Secondary | ICD-10-CM | POA: Diagnosis not present

## 2022-10-12 DIAGNOSIS — G9340 Encephalopathy, unspecified: Secondary | ICD-10-CM | POA: Diagnosis not present

## 2022-10-12 DIAGNOSIS — K259 Gastric ulcer, unspecified as acute or chronic, without hemorrhage or perforation: Secondary | ICD-10-CM | POA: Diagnosis not present

## 2022-10-12 DIAGNOSIS — I252 Old myocardial infarction: Secondary | ICD-10-CM

## 2022-10-12 HISTORY — DX: Hyperkalemia: E87.5

## 2022-10-12 LAB — RESP PANEL BY RT-PCR (RSV, FLU A&B, COVID)  RVPGX2
Influenza A by PCR: NEGATIVE
Influenza B by PCR: NEGATIVE
Resp Syncytial Virus by PCR: NEGATIVE
SARS Coronavirus 2 by RT PCR: NEGATIVE

## 2022-10-12 LAB — CBC WITH DIFFERENTIAL/PLATELET
Abs Immature Granulocytes: 0.06 10*3/uL (ref 0.00–0.07)
Basophils Absolute: 0.1 10*3/uL (ref 0.0–0.1)
Basophils Relative: 1 %
Eosinophils Absolute: 0.2 10*3/uL (ref 0.0–0.5)
Eosinophils Relative: 2 %
HCT: 19.7 % — ABNORMAL LOW (ref 36.0–46.0)
Hemoglobin: 6.1 g/dL — CL (ref 12.0–15.0)
Immature Granulocytes: 1 %
Lymphocytes Relative: 24 %
Lymphs Abs: 2.5 10*3/uL (ref 0.7–4.0)
MCH: 31 pg (ref 26.0–34.0)
MCHC: 31 g/dL (ref 30.0–36.0)
MCV: 100 fL (ref 80.0–100.0)
Monocytes Absolute: 0.8 10*3/uL (ref 0.1–1.0)
Monocytes Relative: 8 %
Neutro Abs: 6.5 10*3/uL (ref 1.7–7.7)
Neutrophils Relative %: 64 %
Platelets: 438 10*3/uL — ABNORMAL HIGH (ref 150–400)
RBC: 1.97 MIL/uL — ABNORMAL LOW (ref 3.87–5.11)
RDW: 15.1 % (ref 11.5–15.5)
WBC: 10.1 10*3/uL (ref 4.0–10.5)
nRBC: 0.7 % — ABNORMAL HIGH (ref 0.0–0.2)

## 2022-10-12 LAB — BLOOD GAS, VENOUS
Acid-base deficit: 8.4 mmol/L — ABNORMAL HIGH (ref 0.0–2.0)
Bicarbonate: 17.2 mmol/L — ABNORMAL LOW (ref 20.0–28.0)
Drawn by: 67681
O2 Saturation: 27.8 %
Patient temperature: 37
pCO2, Ven: 35 mmHg — ABNORMAL LOW (ref 44–60)
pH, Ven: 7.3 (ref 7.25–7.43)
pO2, Ven: <31 mmHg — CL (ref 32–45)

## 2022-10-12 LAB — COMPREHENSIVE METABOLIC PANEL
ALT: 9 U/L (ref 0–44)
AST: 12 U/L — ABNORMAL LOW (ref 15–41)
Albumin: 2.7 g/dL — ABNORMAL LOW (ref 3.5–5.0)
Alkaline Phosphatase: 36 U/L — ABNORMAL LOW (ref 38–126)
Anion gap: 27 — ABNORMAL HIGH (ref 5–15)
BUN: 121 mg/dL — ABNORMAL HIGH (ref 6–20)
CO2: 15 mmol/L — ABNORMAL LOW (ref 22–32)
Calcium: 8.2 mg/dL — ABNORMAL LOW (ref 8.9–10.3)
Chloride: 94 mmol/L — ABNORMAL LOW (ref 98–111)
Creatinine, Ser: 9.1 mg/dL — ABNORMAL HIGH (ref 0.44–1.00)
GFR, Estimated: 5 mL/min — ABNORMAL LOW (ref >60)
Glucose, Bld: 209 mg/dL — ABNORMAL HIGH (ref 70–99)
Potassium: 6.5 mmol/L (ref 3.5–5.1)
Sodium: 136 mmol/L (ref 135–145)
Total Bilirubin: 0.8 mg/dL (ref 0.3–1.2)
Total Protein: 7 g/dL (ref 6.5–8.1)

## 2022-10-12 LAB — TROPONIN I (HIGH SENSITIVITY)
Troponin I (High Sensitivity): 37 ng/L — ABNORMAL HIGH (ref <18)
Troponin I (High Sensitivity): 35 ng/L — ABNORMAL HIGH (ref <18)

## 2022-10-12 LAB — POC OCCULT BLOOD, ED: Fecal Occult Bld: NEGATIVE

## 2022-10-12 LAB — BETA-HYDROXYBUTYRIC ACID: Beta-Hydroxybutyric Acid: 0.56 mmol/L — ABNORMAL HIGH (ref 0.05–0.27)

## 2022-10-12 LAB — LIPASE, BLOOD: Lipase: 45 U/L (ref 11–51)

## 2022-10-12 LAB — APTT: aPTT: 25 seconds (ref 24–36)

## 2022-10-12 LAB — PROTIME-INR
INR: 1.3 — ABNORMAL HIGH (ref 0.8–1.2)
Prothrombin Time: 15.6 seconds — ABNORMAL HIGH (ref 11.4–15.2)

## 2022-10-12 LAB — BRAIN NATRIURETIC PEPTIDE: B Natriuretic Peptide: 293.3 pg/mL — ABNORMAL HIGH (ref 0.0–100.0)

## 2022-10-12 LAB — HCG, QUANTITATIVE, PREGNANCY: hCG, Beta Chain, Quant, S: 2 m[IU]/mL (ref <5)

## 2022-10-12 LAB — PREPARE RBC (CROSSMATCH)

## 2022-10-12 LAB — LACTIC ACID, PLASMA: Lactic Acid, Venous: 2 mmol/L (ref 0.5–1.9)

## 2022-10-12 LAB — ETHANOL: Alcohol, Ethyl (B): <10 mg/dL (ref <10)

## 2022-10-12 MED ORDER — SENNOSIDES-DOCUSATE SODIUM 8.6-50 MG PO TABS
1.0000 | ORAL_TABLET | Freq: Every day | ORAL | Status: DC
  Administered 2022-10-14 – 2022-10-15 (×2): 1 via ORAL
  Filled 2022-10-12 (×3): qty 1

## 2022-10-12 MED ORDER — CALCIUM ACETATE (PHOS BINDER) 667 MG PO CAPS
2001.0000 mg | ORAL_CAPSULE | Freq: Three times a day (TID) | ORAL | Status: DC
  Administered 2022-10-14 – 2022-10-16 (×6): 2001 mg via ORAL
  Filled 2022-10-12 (×8): qty 3

## 2022-10-12 MED ORDER — CHLORHEXIDINE GLUCONATE CLOTH 2 % EX PADS
6.0000 | MEDICATED_PAD | Freq: Every day | CUTANEOUS | Status: DC
  Administered 2022-10-13 – 2022-10-16 (×4): 6 via TOPICAL

## 2022-10-12 MED ORDER — CALCITRIOL 0.25 MCG PO CAPS
1.2500 ug | ORAL_CAPSULE | ORAL | Status: DC
  Administered 2022-10-15: 1.25 ug via ORAL
  Filled 2022-10-12: qty 5
  Filled 2022-10-12: qty 1

## 2022-10-12 MED ORDER — INSULIN GLARGINE-YFGN 100 UNIT/ML ~~LOC~~ SOLN
15.0000 [IU] | Freq: Every day | SUBCUTANEOUS | Status: DC
  Administered 2022-10-13 – 2022-10-15 (×3): 15 [IU] via SUBCUTANEOUS
  Filled 2022-10-12 (×5): qty 0.15

## 2022-10-12 MED ORDER — CALCIUM GLUCONATE-NACL 1-0.675 GM/50ML-% IV SOLN
1.0000 g | Freq: Once | INTRAVENOUS | Status: AC
  Administered 2022-10-12: 1000 mg via INTRAVENOUS
  Filled 2022-10-12: qty 50

## 2022-10-12 MED ORDER — GABAPENTIN 100 MG PO CAPS
100.0000 mg | ORAL_CAPSULE | Freq: Every day | ORAL | Status: DC
  Administered 2022-10-14 – 2022-10-15 (×2): 100 mg via ORAL
  Filled 2022-10-12 (×2): qty 1

## 2022-10-12 MED ORDER — SODIUM ZIRCONIUM CYCLOSILICATE 5 G PO PACK
5.0000 g | PACK | Freq: Every day | ORAL | Status: DC
  Administered 2022-10-14 – 2022-10-16 (×3): 5 g via ORAL
  Filled 2022-10-12 (×4): qty 1

## 2022-10-12 MED ORDER — ALBUTEROL SULFATE (2.5 MG/3ML) 0.083% IN NEBU: 2.5000 mg | INHALATION_SOLUTION | RESPIRATORY_TRACT | Status: DC | PRN

## 2022-10-12 MED ORDER — SODIUM BICARBONATE 8.4 % IV SOLN
50.0000 meq | Freq: Once | INTRAVENOUS | Status: AC
  Administered 2022-10-12: 50 meq via INTRAVENOUS
  Filled 2022-10-12: qty 50

## 2022-10-12 MED ORDER — BICTEGRAVIR-EMTRICITAB-TENOFOV 50-200-25 MG PO TABS
1.0000 | ORAL_TABLET | Freq: Every day | ORAL | Status: DC
  Administered 2022-10-14 – 2022-10-16 (×3): 1 via ORAL
  Filled 2022-10-12 (×4): qty 1

## 2022-10-12 MED ORDER — ACETAMINOPHEN 500 MG PO TABS: 1000.0000 mg | ORAL_TABLET | Freq: Four times a day (QID) | ORAL | Status: DC | PRN

## 2022-10-12 MED ORDER — ATORVASTATIN CALCIUM 80 MG PO TABS
80.0000 mg | ORAL_TABLET | Freq: Every day | ORAL | Status: DC
  Administered 2022-10-14 – 2022-10-16 (×3): 80 mg via ORAL
  Filled 2022-10-12 (×3): qty 1

## 2022-10-12 MED ORDER — INSULIN ASPART 100 UNIT/ML IV SOLN
5.0000 [IU] | Freq: Once | INTRAVENOUS | Status: AC
  Administered 2022-10-12: 5 [IU] via INTRAVENOUS

## 2022-10-12 MED ORDER — RENA-VITE PO TABS
1.0000 | ORAL_TABLET | ORAL | Status: DC
  Administered 2022-10-14 – 2022-10-15 (×2): 1 via ORAL
  Filled 2022-10-12 (×3): qty 1

## 2022-10-12 MED ORDER — DIAZEPAM 5 MG/ML IJ SOLN
2.5000 mg | Freq: Once | INTRAMUSCULAR | Status: AC
  Administered 2022-10-12: 2.5 mg via INTRAVENOUS
  Filled 2022-10-12: qty 2

## 2022-10-12 MED ORDER — ACETAMINOPHEN 650 MG RE SUPP: 650.0000 mg | Freq: Four times a day (QID) | RECTAL | Status: DC | PRN

## 2022-10-12 MED ORDER — DEXTROSE 50 % IV SOLN
1.0000 | Freq: Once | INTRAVENOUS | Status: AC
  Administered 2022-10-12: 50 mL via INTRAVENOUS
  Filled 2022-10-12: qty 50

## 2022-10-12 MED ORDER — SODIUM CHLORIDE 0.9% IV SOLUTION: Freq: Once | INTRAVENOUS | Status: DC

## 2022-10-12 MED ORDER — HYDROMORPHONE HCL 1 MG/ML IJ SOLN: 0.5000 mg | Freq: Once | INTRAMUSCULAR | Status: DC

## 2022-10-12 NOTE — ED Notes (Signed)
Report given to dialysis RN consent and stickers sent with patient

## 2022-10-12 NOTE — Progress Notes (Signed)
Pre Hemodialysis   10/12/22 2049  Vitals  Temp 98 F (36.7 C)  Pulse Rate (!) 101  Resp 17  BP (!) 108/96  SpO2 98 %  O2 Device Nasal Cannula  Oxygen Therapy  O2 Flow Rate (L/min) 2 L/min  Pre Treatment  Is pt a NEW START this admission?  No  Vascular access used during treatment Fistula  Patient is receiving dialysis in a chair No  Hemodialysis Consent Verified Yes  Hemodialysis Standing Orders Initiated Yes  ECG (Telemetry) Monitor On Yes  Prime Ordered Normal Saline  Length of  DialysisTreatment -hour(s) 4 Hour(s)  Dialysis mode HD  Dialyzer Revaclear 400  Dialysate 1K (1st Hour then 2K 2.5Ca)  Dialysate Flow Ordered 300  Blood Flow Rate Ordered 400 mL/min  Ultrafiltration Goal 3000 Liters  Blood Products Ordered Packed Red Blood Cells

## 2022-10-12 NOTE — Progress Notes (Signed)
This encounter was created in error - please disregard.

## 2022-10-12 NOTE — ED Notes (Signed)
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 Patient pending admit and urgent dialysis

## 2022-10-12 NOTE — ED Provider Notes (Signed)
[facility] Provider Note   CSN: [id number] Arrival date & time: 10/12/22  1641     History  Chief Complaint  Patient presents with   Chest Pain   Shortness of Breath    Kathy Frank is a 47 y.o. female.  With PMH of ESRD on HD TThS, HIV, CAD, CVA, DM 2, esophagitis and GERD presenting with chest pain and shortness of breath.  Patient given nitroglycerin and 324 aspirin with EMS.  On arrival she is denying chest pain but reporting shortness of breath.  Patient reports worsening shortness of breath over the past couple of days but significantly worse this morning.  She did not feel right this morning.  She also noted a mild tension headache wrapping around her head.  The last time she had this she had a MI, but denying any chest pain actively.  Her main complaint is feeling short of breath she has had a dry cough but no fevers, no chills, no congestion rhinorrhea or sore throat.  She feels like they are not taking enough off of her on dialysis sessions.  Her last session was this past Thursday and they took 2500 off.  She denies any drug or alcohol use.  Denies history of blood clot.  Did have heavy menstrual bleeding last week but no current bleeding now.  No melena no hematochezia and no hematemesis.  She is not on any blood thinners.  Admitted 10/07/2022 to 10/02/2022 for complaints of chest pain and volume overload.   Chest Pain Associated symptoms: shortness of breath   Shortness of Breath Associated symptoms: chest pain        Home Medications Prior to Admission medications   Medication Sig Start Date End Date Taking? Authorizing Provider  Accu-Chek Softclix Lancets lancets Use as instructed 05/02/21   Atway, Rayann N, DO  albuterol (VENTOLIN HFA) 108 (90 Base) MCG/ACT inhaler Inhale 2 puffs into the lungs every 4 (four) hours as needed for wheezing or shortness of breath. 02/14/21   Angiulli, Lavon Paganini, PA-C  amLODipine-olmesartan  (AZOR) 10-40 MG tablet Take 1 tablet by mouth every evening. 10/02/22 11/01/22  Delene Ruffini, MD  atorvastatin (LIPITOR) 80 MG tablet Take 1 tablet (80 mg total) by mouth daily. Patient not taking: Reported on 06/28/2022 05/24/21   Masters, Joellen [place], DO  bictegravir-emtricitabine-tenofovir AF (BIKTARVY) 50-200-25 MG TABS tablet Take 1 tablet by mouth daily. OVERDUE FOR OFFICE FOLLOW UP - PLEASE CALL OUR OFFICE TO SCHEDULE [phone] 08/26/22   Michel Bickers, MD  calcitRIOL (ROCALTROL) 0.25 MCG capsule Take 5 capsules (1.25 mcg total) by mouth Every Tuesday,Thursday,and Saturday with dialysis. 07/02/22   Rick Duff, MD  calcium acetate (PHOSLO) 667 MG capsule Take 2 capsules (1,334 mg total) by mouth 3 (three) times daily with meals. Patient taking differently: Take 2,001 mg by mouth 3 (three) times daily with meals. 06/30/22   Rick Duff, MD  Continuous Blood Gluc Receiver (DEXCOM G7 RECEIVER) DEVI Use to check blood sugars cntinuously 06/30/22   Rick Duff, MD  Continuous Blood Gluc Sensor (DEXCOM G7 SENSOR) MISC Use to check blood sugar continuously 06/30/22   Rick Duff, MD  cyclobenzaprine (FLEXERIL) 5 MG tablet Take 1 tablet (5 mg total) by mouth 2 (two) times daily as needed for muscle spasms. 10/02/22 11/01/22  Delene Ruffini, MD  Darbepoetin Alfa (ARANESP) 200 MCG/0.4ML SOSY injection Inject 0.4 mLs (200 mcg total) into the skin every Monday at 6 PM. 07/06/22   Rick Duff, MD  diclofenac Sodium (VOLTAREN) 1 % GEL Apply 4 g topically 4 (four) times daily. 10/02/22   Delene Ruffini, MD  EQ ASPIRIN ADULT LOW DOSE 81 MG tablet TAKE 1 TABLET BY MOUTH ONCE DAILY. SWALLOW WHOLE. Patient not taking: Reported on 09/29/2022 02/06/22   Waynetta Sandy, MD  gabapentin (NEURONTIN) 100 MG capsule Take 100 mg by mouth at bedtime. 09/10/22   [provider]  glucose blood test strip Use as instructed 05/02/21   Atway, Rayann N, DO  insulin glargine  (LANTUS SOLOSTAR) 100 UNIT/ML Solostar Pen Inject 15 Units into the skin at bedtime. 06/30/22   Rick Duff, MD  Insulin Pen Needle (PENTIPS) 32G X 4 MM MISC Use as directed 02/14/21   Meredith Staggers, MD  lidocaine (LIDODERM) 5 % Place 1 patch onto the skin daily. Remove & Discard patch within 12 hours or as directed by MD 10/03/22   Delene Ruffini, MD  liraglutide (VICTOZA) 18 MG/3ML SOPN Inject 0.6 mg into the skin daily. 06/30/22 06/25/23  Rick Duff, MD  [facility] 5 g packet Take 5 g by mouth daily. 07/30/22   [provider]  multivitamin (RENA-VIT) TABS tablet Take 1 tablet by mouth daily. Patient taking differently: Take 1 tablet by mouth See admin instructions. On Tuesday, Wednesday and Thursday 02/14/21   Angiulli, Lavon Paganini, PA-C  oxyCODONE (ROXICODONE) 5 MG immediate release tablet Take 1 tablet (5 mg total) by mouth every 6 (six) hours as needed for severe pain. 10/03/22   Dorie Rank, MD  polyethylene glycol (MIRALAX / GLYCOLAX) 17 g packet Take 17 g by mouth daily. Patient taking differently: Take 17 g by mouth daily as needed for moderate constipation or mild constipation. 07/01/22   Rick Duff, MD      Allergies    Patient has no known allergies.    Review of Systems   Review of Systems  Respiratory:  Positive for shortness of breath.   Cardiovascular:  Positive for chest pain.    Physical Exam Updated Vital Signs BP (!) 80/38 (BP Location: Right Arm)   Pulse 60   Temp 98.1 F (36.7 C) (Oral)   Resp (!) 21   Ht '5\' 5"'$  (1.651 m)   Wt 78 kg   LMP  (LMP Unknown)   SpO2 100%   BMI 28.62 kg/m  Physical Exam Constitutional: Alert and oriented.  Uncomfortable and anxious but nontoxic Eyes: Conjunctivae are normal. ENT      Head: Normocephalic and atraumatic. Cardiovascular: S1, S2,  Normal and symmetric distal pulses are present in all extremities.Warm and well perfused.  Left upper extremity AV Shiley with bruit and thrill Respiratory: Mildly  tachypneic. Breath sounds are normal. O2 sat 100 on 2L [place], satting mid 90s/high 90s on RA Gastrointestinal: Soft and nontender.  Musculoskeletal: Normal range of motion in all extremities. Bilateral BKA's clean dry and intact Neurologic: Normal speech and language.  No facial droop.  Moving all extremities equally.  Sensation grossly intact.  No gross focal neurologic deficits are appreciated. Skin: Skin is mildly diaphoretic Psychiatric: Mood and affect are normal. Speech and behavior are normal.  ED Results / Procedures / Treatments   Labs (all labs ordered are listed, but only abnormal results are displayed) Labs Reviewed  COMPREHENSIVE METABOLIC PANEL - Abnormal; Notable for the following components:      Result Value   Potassium 6.5 (*)    Chloride 94 (*)    CO2 15 (*)    Glucose, Bld 209 (*)  BUN 121 (*)    Creatinine, Ser 9.10 (*)    Calcium 8.2 (*)    Albumin 2.7 (*)    AST 12 (*)    Alkaline Phosphatase 36 (*)    GFR, Estimated 5 (*)    Anion gap 27 (*)    All other components within normal limits  CBC WITH DIFFERENTIAL/PLATELET - Abnormal; Notable for the following components:   RBC 1.97 (*)    Hemoglobin 6.1 (*)    HCT 19.7 (*)    Platelets 438 (*)    nRBC 0.7 (*)    All other components within normal limits  BRAIN NATRIURETIC PEPTIDE - Abnormal; Notable for the following components:   B Natriuretic Peptide 293.3 (*)    All other components within normal limits  BLOOD GAS, VENOUS - Abnormal; Notable for the following components:   pCO2, Ven 35 (*)    pO2, Ven <31 (*)    Bicarbonate 17.2 (*)    Acid-base deficit 8.4 (*)    All other components within normal limits  PROTIME-INR - Abnormal; Notable for the following components:   Prothrombin Time 15.6 (*)    INR 1.3 (*)    All other components within normal limits  LACTIC ACID, PLASMA - Abnormal; Notable for the following components:   Lactic Acid, Venous 2.0 (*)    All other components within normal limits   BETA-HYDROXYBUTYRIC ACID - Abnormal; Notable for the following components:   Beta-Hydroxybutyric Acid 0.56 (*)    All other components within normal limits  TROPONIN I (HIGH SENSITIVITY) - Abnormal; Notable for the following components:   Troponin I (High Sensitivity) 37 (*)    All other components within normal limits  TROPONIN I (HIGH SENSITIVITY) - Abnormal; Notable for the following components:   Troponin I (High Sensitivity) 35 (*)    All other components within normal limits  RESP PANEL BY RT-PCR (RSV, FLU A&B, COVID)  RVPGX2  LIPASE, BLOOD  HCG, QUANTITATIVE, PREGNANCY  APTT  ETHANOL  LACTIC ACID, PLASMA  HEPATITIS B SURFACE ANTIGEN  HEPATITIS B SURFACE ANTIBODY, QUANTITATIVE  PHOSPHORUS  CBC  RENAL FUNCTION PANEL  POC OCCULT BLOOD, ED  TYPE AND SCREEN  PREPARE RBC (CROSSMATCH)    EKG EKG Interpretation  Date/Time:  Monday October 12 2022 16:54:03 EST Ventricular Rate:  96 PR Interval:  150 QRS Duration: 101 QT Interval:  367 QTC Calculation: 464 R Axis:   62 Text Interpretation: Sinus rhythm Probable left atrial enlargement Low voltage with right axis deviation Left ventricular hypertrophy Peaked T waves anterior leads Confirmed by Georgina Snell [phone]) on 10/12/2022 5:09:31 PM  Radiology DG Chest Portable 1 View  Result Date: 10/12/2022 CLINICAL DATA:  Chest pain and shortness of breath. EXAM: PORTABLE CHEST 1 VIEW COMPARISON:  10/03/2022 FINDINGS: Heart size is upper limits of normal but similar to the previous examination. No overt pulmonary edema. No focal airspace disease. Atherosclerotic calcifications at the aortic arch. Trachea is midline. Negative for a pneumothorax. IMPRESSION: 1. No acute cardiopulmonary disease. 2. Aortic atherosclerosis. Electronically Signed   By: Markus Daft M.D.   On: 10/12/2022 17:35    Procedures .Critical Care  Performed by: Elgie Congo, MD Authorized by: Elgie Congo, MD   Critical care provider statement:     Critical care time (minutes):  74   Critical care was necessary to treat or prevent imminent or life-threatening deterioration of the following conditions:  Metabolic crisis   Critical care was time spent personally by me  on the following activities:  Development of treatment plan with patient or surrogate, discussions with consultants, evaluation of patient's response to treatment, examination of patient, ordering and review of laboratory studies, ordering and review of radiographic studies, ordering and performing treatments and interventions, pulse oximetry, re-evaluation of patient's condition, review of old charts and obtaining history from patient or surrogate   Care discussed with: admitting provider       Medications Ordered in ED Medications  0.9 %  sodium chloride infusion (Manually program via Guardrails IV Fluids) (has no administration in time range)  Chlorhexidine Gluconate Cloth 2 % PADS 6 each (has no administration in time range)  acetaminophen (TYLENOL) tablet 1,000 mg (has no administration in time range)    Or  acetaminophen (TYLENOL) suppository 650 mg (has no administration in time range)  senna-docusate (Senokot-S) tablet 1 tablet (has no administration in time range)  albuterol (PROVENTIL) (2.5 MG/3ML) 0.083% nebulizer solution 2.5 mg (has no administration in time range)  bictegravir-emtricitabine-tenofovir AF (BIKTARVY) 50-200-25 MG per tablet 1 tablet (has no administration in time range)  calcitRIOL (ROCALTROL) capsule 1.25 mcg (has no administration in time range)  calcium acetate (PHOSLO) capsule 2,001 mg (has no administration in time range)  gabapentin (NEURONTIN) capsule 100 mg (has no administration in time range)  insulin glargine-yfgn (SEMGLEE) injection 15 Units (has no administration in time range)  atorvastatin (LIPITOR) tablet 80 mg (has no administration in time range)  sodium zirconium cyclosilicate (LOKELMA) packet 5 g (has no administration in time  range)  multivitamin (RENA-VIT) tablet 1 tablet (has no administration in time range)  diazepam (VALIUM) injection 2.5 mg (2.5 mg Intravenous Given 10/12/22 1733)  calcium gluconate 1 g/ 50 mL sodium chloride IVPB (0 mg Intravenous Stopped 10/12/22 1919)  insulin aspart (novoLOG) injection 5 Units (5 Units Intravenous Given 10/12/22 1856)    And  dextrose 50 % solution 50 mL (50 mLs Intravenous Given 10/12/22 1856)  sodium bicarbonate injection 50 mEq (50 mEq Intravenous Given 10/12/22 1856)    ED Course/ Medical Decision Making/ A&P Clinical Course as of 10/13/22 0142  Mon Oct 12, 2022  1810 Hemoglobin 6.1 and patient's blood pressure soft.  Platelets elevated 438.  She is denying any abdominal pain and abdominal exam is benign.  She denies any active bleeding.  She did have heavy menstrual bleed last week.  I have ordered for 1 unit transfusion.  She is not on any anticoagulation requiring reversal.  Suspect some anemia contributing to patient's shortness of breath. [VB]  [id number] Labs resulted.  She is hyperkalemic 6.5 with EKG changes.  She has not anion gap 27 acidosis bicarbonate 15 VBG is pending now.  Glucose is 209.  Less likely DKA.  Suspect uremic acidosis as her BUN is 121.  Suspect her shortness of breath is related to both acidosis and anemia.  I have ordered temporizing measures calcium gluconate, insulin/D50, bicarbonate.  I spoke with Dr. Augustin Coupe of nephrology who will take her for dialysis.  Will call hospitalist for admission. [VB]  1902 Spoke with Dr. Bridgett Larsson hospitalist who says this patient is a bounce back from internal medicine and thus must go to internal medicine for admission. [VB]  [facility] with internal medicine team will put in orders for admission. [VB]    Clinical Course User Index [VB] Elgie Congo, MD   {  Medical Decision Making MAWADA DEWAARD is a 47 y.o. female.  With PMH of ESRD on HD TThS, HIV, CAD, CVA, DM 2, esophagitis and GERD  presenting with chest pain and shortness of breath.  Patient given nitroglycerin and 324 aspirin with EMS.  On arrival she is denying chest pain but reporting shortness of breath.  Patient's shortness of breath likely 2/2 AG acidosis and acute on chronic anemia. No active blood loss now.  CXR reviewed by me, no signs of pulmonary edema, pneumonia or pneumothorax.  EKG did show peaked T waves c/w hyperkalemia, no ST/T changes concerning for ACS.   Hemoglobin 6.1 and patient's blood pressure soft.  Platelets elevated 438.   I have ordered for 1 unit transfusion.  She is not on any anticoagulation requiring reversal.    She is hyperkalemic 6.5 with EKG changes.  She has anion gap 27 acidosis bicarbonate 15 VBG is pending now.  Glucose is 209.  Less likely DKA.  Suspect uremic acidosis as her BUN is 121.  ordered temporizing measures calcium gluconate, insulin/D50, bicarbonate.  I spoke with Dr. Augustin Coupe of nephrology who will take her for dialysis.   Spoke with internal medicine team will put in orders for admission. [VB]   Amount and/or Complexity of Data Reviewed Labs: ordered. Radiology: ordered.  Risk OTC drugs. Prescription drug management. Decision regarding hospitalization.    Final Clinical Impression(s) / ED Diagnoses Final diagnoses:  Anemia, unspecified type  Shortness of breath  High anion gap metabolic acidosis  Hyperkalemia    Rx / DC Orders ED Discharge Orders     None         Elgie Congo, MD 10/13/22 [phone]

## 2022-10-12 NOTE — Consult Note (Signed)
Reason for Consult: ESRD Referring Physician:  Dr. Georgina Snell  Chief Complaint: shortness of breath and chest pain   Dialysis Orders: TTS [facility] 4h  400/1.5    78kg  2K/ 3Ca bath  LUA AVF  Hep none - last HD 3/2, post wt 80.3kg - rocaltrol 1.0 mcg po tiw - venofer '100mg'$  tiw thru 3/14 - mircera 200 mcg q 2, last 2/15, does not appear she received it on 2/29   Home meds include - albuterol, amlodipine/ olmesartan, biktarvy, rocaltrol, norvasc 10, lipitor, phoslo 2 ac tid, aspirin, prns/ vits/ supps   Assessment/Plan: SOB - improving. Not to EDW yet. Continue UF as tolerated. Marland Kitchen  ESRD - on HD TTS. will write orders for hd tonight. HTN/ volume - as above. BP's high. Continue max UF with HD Anemia w/ h/o heavy recent menses; will need to be transfused and dialysis as well. MBD ckd - CCa in range; check PO4 for binder management. Cont po vdra and phoslo as binder for now. HIV  - per primary team. DM2 Recent PNA/Rib pain  Migraines - present since MI; been taking BC powder.  HPI: Kathy Frank is an 47 y.o. female  w/ PMH HIV, HTN, DM2, CASHD w/ h/o MI, ESRD dialyzing at [facility] TTS as below who presents to ED with shortness of breath and chest pain usually worse in the morning which has been going on for a few days. She also has a headache but denies diplopia or dizziness. She has only a dry cough with no fever, chills, myalgias, sore throat. She denies any GI bleeding or nausea/ vomiting. She was recently admitted with chest pain and volume overload just discharged a week ago. Her last treatment on dialysis was on 3/2 (Saturday) and she received a full treatment leaving at 80.3kg which is still above her EDW of 78kg. Prior to that she left at 78.1kg on 2/29. Patient did not have CP in the ED but was noted to have a K of 6.5 and HCO3 15 with a BUN/Cr of 121/9.1. Hb was only 6.1.   ROS Pertinent items are noted in HPI.  Chemistry and CBC: Creat  Date/Time Value Ref Range Status   05/29/2020 11:55 AM 12.07 (H) 0.50 - 1.10 mg/dL Final    Comment:    Verified by repeat analysis. .   05/27/2016 10:18 AM 1.68 (H) 0.50 - 1.10 mg/dL Final  11/14/2014 11:54 AM 0.92 0.50 - 1.10 mg/dL Final  11/24/2013 03:00 PM 1.03 0.50 - 1.10 mg/dL Final   Creatinine, Ser  Date/Time Value Ref Range Status  10/12/2022 05:20 PM 9.10 (H) 0.44 - 1.00 mg/dL Final  10/03/2022 10:43 AM 5.18 (H) 0.44 - 1.00 mg/dL Final  10/02/2022 10:08 AM 5.00 (H) 0.44 - 1.00 mg/dL Final  10/01/2022 02:49 PM 6.25 (H) 0.44 - 1.00 mg/dL Final  09/30/2022 11:00 PM 7.00 (H) 0.44 - 1.00 mg/dL Final  09/30/2022 08:27 AM 6.64 (H) 0.44 - 1.00 mg/dL Final  09/29/2022 01:10 PM 4.53 (H) 0.44 - 1.00 mg/dL Final  09/27/2022 05:46 PM 7.01 (H) 0.44 - 1.00 mg/dL Final  09/15/2022 01:40 PM 4.21 (H) 0.44 - 1.00 mg/dL Final  06/30/2022 02:31 AM 5.49 (H) 0.44 - 1.00 mg/dL Final  06/29/2022 03:25 AM 5.00 (H) 0.44 - 1.00 mg/dL Final  06/28/2022 02:24 AM 9.83 (H) 0.44 - 1.00 mg/dL Final  06/27/2022 02:35 PM 8.96 (H) 0.44 - 1.00 mg/dL Final  06/11/2022 12:02 PM 9.25 (H) 0.44 - 1.00 mg/dL Final  04/27/2022 08:19 PM 10.66 (  H) 0.44 - 1.00 mg/dL Final  03/09/2022 05:12 AM 12.67 (H) 0.44 - 1.00 mg/dL Final  11/11/2021 03:55 AM 6.61 (H) 0.44 - 1.00 mg/dL Final    Comment:    DELTA CHECK NOTED  11/10/2021 12:24 AM 10.25 (H) 0.44 - 1.00 mg/dL Final  11/09/2021 02:24 PM 10.29 (H) 0.44 - 1.00 mg/dL Final  07/10/2021 07:15 AM 12.12 (H) 0.44 - 1.00 mg/dL Final  07/09/2021 11:54 PM 11.86 (H) 0.44 - 1.00 mg/dL Final  05/22/2021 03:17 AM 7.22 (H) 0.44 - 1.00 mg/dL Final  05/21/2021 08:49 AM 5.55 (H) 0.44 - 1.00 mg/dL Final    Comment:    DELTA CHECK NOTED DIALYSIS   05/20/2021 12:14 PM 9.25 (H) 0.44 - 1.00 mg/dL Final  05/20/2021 07:00 AM 8.85 (H) 0.44 - 1.00 mg/dL Final  04/22/2021 01:36 AM 9.27 (H) 0.44 - 1.00 mg/dL Final  04/21/2021 02:05 AM 8.12 (H) 0.44 - 1.00 mg/dL Final  04/20/2021 12:52 AM 5.87 (H) 0.44 - 1.00 mg/dL Final     Comment:    DELTA CHECK NOTED  04/19/2021 06:05 AM 10.68 (H) 0.44 - 1.00 mg/dL Final  04/19/2021 01:02 AM 10.38 (H) 0.44 - 1.00 mg/dL Final  04/18/2021 06:40 PM 9.57 (H) 0.44 - 1.00 mg/dL Final  04/18/2021 02:16 AM 9.07 (H) 0.44 - 1.00 mg/dL Final  04/17/2021 12:40 PM 8.05 (H) 0.44 - 1.00 mg/dL Final  04/01/2021 11:25 AM 10.01 (H) 0.44 - 1.00 mg/dL Final  03/30/2021 01:29 AM 5.80 (H) 0.44 - 1.00 mg/dL Final    Comment:    DELTA CHECK NOTED  03/29/2021 12:33 AM 9.64 (H) 0.44 - 1.00 mg/dL Final  03/28/2021 05:46 AM 8.72 (H) 0.44 - 1.00 mg/dL Final  03/28/2021 05:46 AM 8.62 (H) 0.44 - 1.00 mg/dL Final  02/15/2021 07:28 AM 7.17 (H) 0.44 - 1.00 mg/dL Final  02/14/2021 11:51 AM 5.60 (H) 0.44 - 1.00 mg/dL Final  02/11/2021 11:51 AM 9.47 (H) 0.44 - 1.00 mg/dL Final  02/08/2021 06:08 AM 7.36 (H) 0.44 - 1.00 mg/dL Final  02/07/2021 06:19 AM 5.36 (H) 0.44 - 1.00 mg/dL Final    Comment:    DIALYSIS  02/06/2021 01:45 AM 8.18 (H) 0.44 - 1.00 mg/dL Final  02/05/2021 03:22 AM 6.10 (H) 0.44 - 1.00 mg/dL Final    Comment:    DIALYSIS  02/04/2021 01:12 AM 10.16 (H) 0.44 - 1.00 mg/dL Final  02/03/2021 12:49 AM 8.30 (H) 0.44 - 1.00 mg/dL Final    Comment:    DELTA CHECK NOTED  02/02/2021 01:07 AM 5.37 (H) 0.44 - 1.00 mg/dL Final    Comment:    DELTA CHECK NOTED  02/01/2021 12:42 AM 8.31 (H) 0.44 - 1.00 mg/dL Final    Comment:    DELTA CHECK NOTED  01/31/2021 02:39 AM 5.52 (H) 0.44 - 1.00 mg/dL Final    Comment:    DIALYSIS  01/30/2021 01:05 AM 8.70 (H) 0.44 - 1.00 mg/dL Final  01/29/2021 04:07 PM 8.06 (H) 0.44 - 1.00 mg/dL Final   Recent Labs  Lab 10/12/22 1720  NA 136  K 6.5*  CL 94*  CO2 15*  GLUCOSE 209*  BUN 121*  CREATININE 9.10*  CALCIUM 8.2*   Recent Labs  Lab 10/12/22 1720  WBC 10.1  NEUTROABS 6.5  HGB 6.1*  HCT 19.7*  MCV 100.0  PLT 438*   Liver Function Tests: Recent Labs  Lab 10/12/22 1720  AST 12*  ALT 9  ALKPHOS 36*  BILITOT 0.8  PROT 7.0  ALBUMIN 2.7*  Recent Labs  Lab 10/12/22 1720  LIPASE 45   No results for input(s): "AMMONIA" in the last 168 hours. Cardiac Enzymes: No results for input(s): "CKTOTAL", "CKMB", "CKMBINDEX", "TROPONINI" in the last 168 hours. Iron Studies: No results for input(s): "IRON", "TIBC", "TRANSFERRIN", "FERRITIN" in the last 72 hours. PT/INR: '@LABRCNTIP'$ (inr:5)  Xrays/Other Studies: ) Results for orders placed or performed during the hospital encounter of 10/12/22 (from the past 48 hour(s))  Resp panel by RT-PCR (RSV, Flu A&B, Covid) Anterior Nasal Swab     Status: None   Collection Time: 10/12/22  5:11 PM   Specimen: Anterior Nasal Swab  Result Value Ref Range   SARS Coronavirus 2 by RT PCR NEGATIVE NEGATIVE   Influenza A by PCR NEGATIVE NEGATIVE   Influenza B by PCR NEGATIVE NEGATIVE    Comment: (NOTE) The Xpert Xpress SARS-CoV-2/FLU/RSV plus assay is intended as an aid in the diagnosis of influenza from Nasopharyngeal swab specimens and should not be used as a sole basis for treatment. Nasal washings and aspirates are unacceptable for Xpert Xpress SARS-CoV-2/FLU/RSV testing.  Fact Sheet for Patients: EntrepreneurPulse.com.au  Fact Sheet for Healthcare Providers: IncredibleEmployment.be  This test is not yet approved or cleared by the [place] FDA and has been authorized for detection and/or diagnosis of SARS-CoV-2 by FDA under an Emergency Use Authorization (EUA). This EUA will remain in effect (meaning this test can be used) for the duration of the COVID-19 declaration under Section 564(b)(1) of the Act, 21 U.S.C. section 360bbb-3(b)(1), unless the authorization is terminated or revoked.     Resp Syncytial Virus by PCR NEGATIVE NEGATIVE    Comment: (NOTE) Fact Sheet for Patients: EntrepreneurPulse.com.au  Fact Sheet for Healthcare Providers: IncredibleEmployment.be  This test is not yet approved or cleared  by the [place] FDA and has been authorized for detection and/or diagnosis of SARS-CoV-2 by FDA under an Emergency Use Authorization (EUA). This EUA will remain in effect (meaning this test can be used) for the duration of the COVID-19 declaration under Section 564(b)(1) of the Act, 21 U.S.C. section 360bbb-3(b)(1), unless the authorization is terminated or revoked.  Performed at [facility], [place] [address], [place], [place] [address]   Comprehensive metabolic panel     Status: Abnormal   Collection Time: 10/12/22  5:20 PM  Result Value Ref Range   Sodium 136 135 - 145 mmol/L   Potassium 6.5 (HH) 3.5 - 5.1 mmol/L    Comment: CRITICAL RESULT CALLED TO, READ BACK BY AND VERIFIED WITH BBlossom Hoops, RN @ [phone] 10/12/22 BY SEKDAHL   Chloride 94 (L) 98 - 111 mmol/L   CO2 15 (L) 22 - 32 mmol/L   Glucose, Bld 209 (H) 70 - 99 mg/dL    Comment: Glucose reference range applies only to samples taken after fasting for at least 8 hours.   BUN 121 (H) 6 - 20 mg/dL   Creatinine, Ser 9.10 (H) 0.44 - 1.00 mg/dL   Calcium 8.2 (L) 8.9 - 10.3 mg/dL   Total Protein 7.0 6.5 - 8.1 g/dL   Albumin 2.7 (L) 3.5 - 5.0 g/dL   AST 12 (L) 15 - 41 U/L   ALT 9 0 - 44 U/L   Alkaline Phosphatase 36 (L) 38 - 126 U/L   Total Bilirubin 0.8 0.3 - 1.2 mg/dL   GFR, Estimated 5 (L) >60 mL/min    Comment: (NOTE) Calculated using the CKD-EPI Creatinine Equation (2021)    Anion gap 27 (H) 5 - 15  Comment: ELECTROLYTES REPEATED TO VERIFY Performed at [facility], [place] [address], [place], [place] [address]   CBC with Differential     Status: Abnormal   Collection Time: 10/12/22  5:20 PM  Result Value Ref Range   WBC 10.1 4.0 - 10.5 K/uL   RBC 1.97 (L) 3.87 - 5.11 MIL/uL   Hemoglobin 6.1 (LL) 12.0 - 15.0 g/dL    Comment: REPEATED TO VERIFY THIS CRITICAL RESULT HAS VERIFIED AND BEEN CALLED TO B.LUSZCAK,RN BY Durene Cal ON [id number] 2024 AT 1758, AND HAS BEEN READ BACK.     HCT 19.7 (L) 36.0 - 46.0 %   MCV  100.0 80.0 - 100.0 fL   MCH 31.0 26.0 - 34.0 pg   MCHC 31.0 30.0 - 36.0 g/dL   RDW 15.1 11.5 - 15.5 %   Platelets 438 (H) 150 - 400 K/uL   nRBC 0.7 (H) 0.0 - 0.2 %   Neutrophils Relative % 64 %   Neutro Abs 6.5 1.7 - 7.7 K/uL   Lymphocytes Relative 24 %   Lymphs Abs 2.5 0.7 - 4.0 K/uL   Monocytes Relative 8 %   Monocytes Absolute 0.8 0.1 - 1.0 K/uL   Eosinophils Relative 2 %   Eosinophils Absolute 0.2 0.0 - 0.5 K/uL   Basophils Relative 1 %   Basophils Absolute 0.1 0.0 - 0.1 K/uL   Immature Granulocytes 1 %   Abs Immature Granulocytes 0.06 0.00 - 0.07 K/uL    Comment: Performed at [facility] [address], [place], [place] [address]  Troponin I (High Sensitivity)     Status: Abnormal   Collection Time: 10/12/22  5:20 PM  Result Value Ref Range   Troponin I (High Sensitivity) 37 (H) <18 ng/L    Comment: (NOTE) Elevated high sensitivity troponin I (hsTnI) values and significant  changes across serial measurements may suggest ACS but many other  chronic and acute conditions are known to elevate hsTnI results.  Refer to the "Links" section for chest pain algorithms and additional  guidance. Performed at [facility], [place] [address], [place], [place] [address]   Brain natriuretic peptide     Status: Abnormal   Collection Time: 10/12/22  5:20 PM  Result Value Ref Range   B Natriuretic Peptide 293.3 (H) 0.0 - 100.0 pg/mL    Comment: Performed at [facility] [address] [place], [place] [address]  Lipase, blood     Status: None   Collection Time: 10/12/22  5:20 PM  Result Value Ref Range   Lipase 45 11 - 51 U/L    Comment: Performed at [facility] [address] [place], [place] [address]  hCG, quantitative, pregnancy     Status: None   Collection Time: 10/12/22  5:20 PM  Result Value Ref Range   hCG, Beta Chain, Quant, S 2 <5 mIU/mL    Comment:          GEST. AGE      CONC.  (mIU/mL)   <=1 WEEK        5 - 50     2 WEEKS       50 - 500      3 WEEKS       100 - 10,000     4 WEEKS     1,000 - 30,000     5 WEEKS     3,500 - 115,000   6-8 WEEKS  12,000 - 270,000    12 WEEKS     15,000 - 220,000        FEMALE AND NON-PREGNANT FEMALE:     LESS THAN 5 mIU/mL Performed at [facility], [place] [address], [place], [place] [address]   Prepare RBC (crossmatch)     Status: None   Collection Time: 10/12/22  6:09 PM  Result Value Ref Range   Order Confirmation      ORDER PROCESSED BY BLOOD BANK Performed at [facility], [place] [address] [place], [place] [address]   Type and screen [place]     Status: None (Preliminary result)   Collection Time: 10/12/22  6:18 PM  Result Value Ref Range   ABO/RH(D) PENDING    Antibody Screen PENDING    Sample Expiration      10/15/2022,2359 Performed at [facility], [place] [address], [place], [place] [address]   POC occult blood, ED     Status: None   Collection Time: 10/12/22  6:26 PM  Result Value Ref Range   Fecal Occult Bld NEGATIVE NEGATIVE   DG Chest Portable 1 View  Result Date: 10/12/2022 CLINICAL DATA:  Chest pain and shortness of breath. EXAM: PORTABLE CHEST 1 VIEW COMPARISON:  10/03/2022 FINDINGS: Heart size is upper limits of normal but similar to the previous examination. No overt pulmonary edema. No focal airspace disease. Atherosclerotic calcifications at the aortic arch. Trachea is midline. Negative for a pneumothorax. IMPRESSION: 1. No acute cardiopulmonary disease. 2. Aortic atherosclerosis. Electronically Signed   By: Markus Daft M.D.   On: 10/12/2022 17:35    PMH:   Past Medical History:  Diagnosis Date   Anal abscess    chronic   Anxiety    CAD (coronary artery disease)    CVA (cerebral vascular accident) ([place])    Depression 06/28/2006   Qualifier: Diagnosis of  By: Riccardo Dubin MD, Todd     Diabetes mellitus type 2 in obese ([place]) 06/28/1994   Dyspnea    uses oxygeb 2 liters per minute at dialysis   End stage renal disease on dialysis  [facility])    on hemodialysis T/Th/Sat   Erosive esophagitis    Esophageal reflux    Eye redness    Gastroparesis    ? diabetic   GERD (gastroesophageal reflux disease)    History of blood transfusion 2019   Human immunodeficiency virus (HIV) disease ([place]) 04/23/2016   Hyperlipidemia    Hypertension    Metabolic bone disease [id number]   Moderate nonproliferative diabetic retinopathy of both eyes ([place]) 11/21/2014   11/14/14: Noted on retinal imaging; needs follow-up imaging in 6 months  05/22/16: Noted on retinal imaging again; needs follow-up imaging in 6 months   PAD (peripheral artery disease) ([place])    Type 2 diabetes mellitus with diabetic peripheral angiopathy without gangrene ([place]) 05/01/2019   Wears dentures    lower   Wound infection s/p L transmetatarsal amputation     PSH:   Past Surgical History:  Procedure Laterality Date   ABDOMINAL AORTOGRAM W/LOWER EXTREMITY N/A 12/13/2020   Procedure: ABDOMINAL AORTOGRAM W/LOWER EXTREMITY;  Surgeon: Angelia Mould, MD;  Location: [facility];  Service: Cardiovascular;  Laterality: N/A;   AMPUTATION Left 07/08/2018   Procedure: AMPUTATION FORTH RAY LEFT FOOT;  Surgeon: [name], MD;  Location: [facility];  Service: Orthopedics;  Laterality: Left;   AMPUTATION Left 08/09/2018   Procedure: Left Transmetatarsal Amputation;  Surgeon: Sharol Given,  Illene Regulus, MD;  Location: [facility];  Service: Orthopedics;  Laterality: Left;   AMPUTATION Left 10/08/2018   Procedure: LEFT BELOW KNEE AMPUTATION;  Surgeon: [name], MD;  Location: [facility];  Service: Orthopedics;  Laterality: Left;   AMPUTATION Left 10/28/2018   Procedure: REVISION BELOW KNEE AMPUTATION;  Surgeon: [name], MD;  Location: [facility];  Service: Orthopedics;  Laterality: Left;   AMPUTATION Left 12/16/2018   Procedure: LEFT ABOVE KNEE AMPUTATION;  Surgeon: [name], MD;  Location: [facility];  Service: Orthopedics;  Laterality: Left;   AMPUTATION Right 01/31/2021   Procedure:  AMPUTATION BELOW KNEE;  Surgeon: Serafina Mitchell, MD;  Location: [facility];  Service: Vascular;  Laterality: Right;   AMPUTATION Right 03/28/2021   Procedure: REVISION OF RIGHT BELOW KNEE AMPUTATION;  Surgeon: Serafina Mitchell, MD;  Location: [facility];  Service: Vascular;  Laterality: Right;   AMPUTATION Right 04/18/2021   Procedure: right below knee amputation/washout placement wound vac;  Surgeon: Cherre Robins, MD;  Location: [facility];  Service: Vascular;  Laterality: Right;   AV FISTULA PLACEMENT Left 10/14/2018   Procedure: Arteriovenous (Av) Fistula Creation Left Arm;  Surgeon: [name], MD;  Location: [facility];  Service: Vascular;  Laterality: Left;   [place] Left 04/14/2019   Procedure: BASILIC VEIN TRANSPOSITION SECOND STAGE LEFT ARM;  Surgeon: Rosetta Posner, MD;  Location: [facility];  Service: Vascular;  Laterality: Left;   CORONARY STENT INTERVENTION N/A 05/21/2021   Procedure: CORONARY STENT INTERVENTION;  Surgeon: Leonie Man, MD;  Location: [facility];  Service: Cardiovascular;  Laterality: N/A;   INCISION AND DRAINAGE ABSCESS N/A 10/24/2020   Procedure: exicision of hydradenitis;  Surgeon: Leighton Ruff, MD;  Location: [facility];  Service: General;  Laterality: N/A;  45 min   INSERTION OF DIALYSIS CATHETER Right 04/14/2019   Procedure: INSERTION OF DIALYSIS CATHETER;  Surgeon: Rosetta Posner, MD;  Location: [facility];  Service: Vascular;  Laterality: Right;   LEFT HEART CATH AND CORONARY ANGIOGRAPHY N/A 05/21/2021   Procedure: LEFT HEART CATH AND CORONARY ANGIOGRAPHY;  Surgeon: Leonie Man, MD;  Location: [facility];  Service: Cardiovascular;  Laterality: N/A;   LOWER EXTREMITY ANGIOGRAPHY N/A 07/05/2018   Procedure: LOWER EXTREMITY ANGIOGRAPHY;  Surgeon: Serafina Mitchell, MD;  Location: [facility];  Service: Cardiovascular;  Laterality: N/A;   PERIPHERAL VASCULAR ATHERECTOMY Right 12/13/2020   Procedure: PERIPHERAL VASCULAR  ATHERECTOMY;  Surgeon: Angelia Mould, MD;  Location: [facility];  Service: Cardiovascular;  Laterality: Right;  Superficial femoral   PERIPHERAL VASCULAR BALLOON ANGIOPLASTY Left 07/05/2018   Procedure: PERIPHERAL VASCULAR BALLOON ANGIOPLASTY;  Surgeon: Serafina Mitchell, MD;  Location: [facility];  Service: Cardiovascular;  Laterality: Left;  SFA   PERIPHERAL VASCULAR BALLOON ANGIOPLASTY Right 12/13/2020   Procedure: PERIPHERAL VASCULAR BALLOON ANGIOPLASTY;  Surgeon: Angelia Mould, MD;  Location: [facility];  Service: Cardiovascular;  Laterality: Right;  Peroneal artery, anterior tibial artery   RECTAL EXAM UNDER ANESTHESIA N/A 10/24/2020   Procedure: EXAM UNDER ANESTHESIA;  Surgeon: Leighton Ruff, MD;  Location: [facility];  Service: General;  Laterality: N/A;   STUMP REVISION Left 10/19/2018   Procedure: REVISION LEFT BELOW KNEE AMPUTATION;  Surgeon: [name], MD;  Location: [facility];  Service: Orthopedics;  Laterality: Left;   TUBAL LIGATION  2002    Allergies: No Known Allergies  Medications:  Prior to Admission medications   Medication Sig Start Date End Date Taking? Authorizing Provider  Accu-Chek Softclix Lancets lancets Use as instructed 05/02/21   Atway, Rayann N, DO  albuterol (VENTOLIN HFA) 108 (90 Base) MCG/ACT inhaler Inhale 2 puffs into the lungs every 4 (four) hours as needed for wheezing or shortness of breath. 02/14/21   Angiulli, Lavon Paganini, PA-C  amLODipine-olmesartan (AZOR) 10-40 MG tablet Take 1 tablet by mouth every evening. 10/02/22 11/01/22  Delene Ruffini, MD  atorvastatin (LIPITOR) 80 MG tablet Take 1 tablet (80 mg total) by mouth daily. Patient not taking: Reported on 06/28/2022 05/24/21   Masters, Joellen Jersey, DO  bictegravir-emtricitabine-tenofovir AF (BIKTARVY) 50-200-25 MG TABS tablet Take 1 tablet by mouth daily. OVERDUE FOR OFFICE FOLLOW UP - PLEASE CALL OUR OFFICE TO SCHEDULE [phone] 08/26/22   Michel Bickers, MD  calcitRIOL (ROCALTROL) 0.25 MCG capsule Take 5 capsules (1.25 mcg total) by mouth Every Tuesday,Thursday,and Saturday with dialysis. 07/02/22   Rick Duff, MD  calcium acetate (PHOSLO) 667 MG capsule Take 2 capsules (1,334 mg total) by mouth 3 (three) times daily with meals. Patient taking differently: Take 2,001 mg by mouth 3 (three) times daily with meals. 06/30/22   Rick Duff, MD  Continuous Blood Gluc Receiver (DEXCOM G7 RECEIVER) DEVI Use to check blood sugars cntinuously 06/30/22   Rick Duff, MD  Continuous Blood Gluc Sensor (DEXCOM G7 SENSOR) MISC Use to check blood sugar continuously 06/30/22   Rick Duff, MD  cyclobenzaprine (FLEXERIL) 5 MG tablet Take 1 tablet (5 mg total) by mouth 2 (two) times daily as needed for muscle spasms. 10/02/22 11/01/22  Delene Ruffini, MD  Darbepoetin Alfa (ARANESP) 200 MCG/0.4ML SOSY injection Inject 0.4 mLs (200 mcg total) into the skin every Monday at 6 PM. 07/06/22   Rick Duff, MD  diclofenac Sodium (VOLTAREN) 1 % GEL Apply 4 g topically 4 (four) times daily. 10/02/22   Delene Ruffini, MD  EQ ASPIRIN ADULT LOW DOSE 81 MG tablet TAKE 1 TABLET BY MOUTH ONCE DAILY. SWALLOW WHOLE. Patient not taking: Reported on 09/29/2022 02/06/22   Waynetta Sandy, MD  gabapentin (NEURONTIN) 100 MG capsule Take 100 mg by mouth at bedtime. 09/10/22   [provider]  glucose blood test strip Use as instructed 05/02/21   Atway, Rayann N, DO  insulin glargine (LANTUS SOLOSTAR) 100 UNIT/ML Solostar Pen Inject 15 Units into the skin at bedtime. 06/30/22   Rick Duff, MD  Insulin Pen Needle (PENTIPS) 32G X 4 MM MISC Use as directed 02/14/21   Meredith Staggers, MD  lidocaine (LIDODERM) 5 % Place 1 patch onto the skin daily. Remove & Discard patch within 12 hours or as directed by MD 10/03/22   Delene Ruffini, MD  liraglutide (VICTOZA) 18 MG/3ML SOPN Inject 0.6 mg into the skin daily. 06/30/22 06/25/23  Rick Duff, MD  [facility] 5 g packet Take 5 g by mouth daily. 07/30/22   [provider]  multivitamin (RENA-VIT) TABS tablet Take 1 tablet by mouth daily. Patient taking differently: Take 1 tablet by mouth See admin instructions. On Tuesday, Wednesday and Thursday 02/14/21   Angiulli, Lavon Paganini, PA-C  ondansetron (ZOFRAN) 4 MG tablet Take 1 tablet (4 mg total) by mouth every 8 (eight) hours as needed for up to 10 days for nausea or vomiting. 10/02/22 10/12/22  Delene Ruffini, MD  oxyCODONE (ROXICODONE) 5 MG immediate release tablet Take 1 tablet (5 mg total) by mouth every 6 (six) hours as needed for severe pain. 10/03/22  Dorie Rank, MD  polyethylene glycol (MIRALAX / GLYCOLAX) 17 g packet Take 17 g by mouth daily. Patient taking differently: Take 17 g by mouth daily as needed for moderate constipation or mild constipation. 07/01/22   Rick Duff, MD    Discontinued Meds:  There are no discontinued medications.  Social History:  reports that she quit smoking about 8 years ago. Her smoking use included cigarettes. She started smoking about 8 years ago. She has a 1.00 pack-year smoking history. She has never used smokeless tobacco. She reports that she does not currently use alcohol. She reports current drug use. Drug: Marijuana.  Family History:   Family History  Problem Relation Age of Onset   Diabetes Mother    Diabetes Brother    Diabetes Daughter    Diabetes Daughter    Mental retardation Brother        died from PNA   Diabetes Maternal Grandmother     Blood pressure (!) 106/48, pulse 97, temperature 98 F (36.7 C), temperature source Oral, resp. rate (!) 22, height '5\' 5"'$  (1.651 m), weight 78 kg, SpO2 100 %. General: Awake, oriented, NAD Heart: S1 and S2; No murmurs, gallops, or rubs Lungs: Clear bilaterally  Abdomen: Soft  non-tender Extremities: R BKA; L AKA; No edema bilateral stumps Dialysis Access: LUE AVF (+) B/T, very tortuous and aneurymsal     Kathy Frank, Hunt Oris, MD 10/12/2022, 6:56 PM

## 2022-10-12 NOTE — ED Notes (Signed)
Pt is repeatedly removing her gown, telemetry leads, and pulsox. Pt yelling out 'help' but even with repeated education to use her call bell is not using her call bell. When asked what she needs help with pt states "I can't breathe and I am hot". Pt was given a mini portable fan. Call light within reach. Pt told that her breathing would probably improve if she kept the oxygen in her nose but patient keeps removing that as well. Will order safety sit to help re-direct patient.

## 2022-10-12 NOTE — H&P (Addendum)
Date: 10/12/2022               Patient Name:  Kathy Frank MRN: [id number]  DOB: Jun 18, 1976 Age / Sex: 47 y.o., female   PCP: [name], MD         Medical Service: Internal Medicine Teaching Service         Attending Physician: Dr. Charise Killian, MD      First Contact: Dr. Johny Blamer, DO Pager [phone]    Second Contact: Dr. Delene Ruffini, MD Pager [phone]         After Hours (After 5p/  First Contact Pager: [phone]  weekends / holidays): Second Contact Pager: [phone]   SUBJECTIVE   Chief Complaint: shortness of breath  History of Present Illness: Kathy Frank is a 47 yo female with ESRD on HD TThS, T2DM with diabetic retinopathy, s/p left AKA and right BKA, HIV, HTN, HLD, PAD and secondary hyperparathyroidism of renal origin who presented with shortness of breath that started this morning.   She states she was in her usual state of health until this morning. She woke up with shortness of breath. No recent illness or sick contacts. Feeling well over the weekend. Does have non-productive cough. Denies fever, chills, n/v/d, chest pain, palpitations, abd pain, urinary symptoms. Does have constipation.  She states not missing any HD sessions. Her last HD session was on Saturday (3/2) where she completed a full session. Had heavy menstrual cycle last week, but no other bleeding. Cycle for 1 week. Period was heavier than usual. Denies any melena, hematochezia, hematuria.   Also endorses migraine today which she took 2 BC powders earlier today. Reports having headaches a few times per week which she takes [facility] powder for relief. She is aware she should not take BC powder but is not sure what else to take for her migraines.   ED Course: Arrived by EMS for SOB/chest tightness/HA. Placed on 2L [place] and received nitro and ASA by EMS with improvement. Vitals showed BP 106/48, HR 97, RR 22 on 2L [place] and afebrile. Labs significant for Hgb 6.1, normal WBC, K 6.5, BUN 121, bicarb 15.  Troponin elevated but flat 37-35. BNP mildly elevated. FOBT negative. Received calcium gluconate, insulin and sodium bicarb. Nephrology consulted for HD. IMTS consulted for admission.   Past Medical History ESRD on HD TThS  Type 2 diabetes c/b diabetic retinopathy S/p left AKA and right BKA  Gastroparesis  HIV Hypertension  Hyperlipidemia PAD Secondary hyperparathyroidism of renal origin  Aortic atherosclerosis GERD Depression Normocytic anemia  Meds:  Albuterol  Amlodipine-olmesartan 10-40 mg qhs Atorvastatin 80 mg daily Biktarvy 50-200-25 mg daily  Calcitriol 1.25 mcg dialysis days  Calcium acetate  Flexeril 5 mg bid prn  Aranesp once a week or every two weeks Lantus 15u qhs Victoza 0.'6mg'$  daily Lokelma 5 g daily  Past Surgical History Past Surgical History:  Procedure Laterality Date   ABDOMINAL AORTOGRAM W/LOWER EXTREMITY N/A 12/13/2020   Procedure: ABDOMINAL AORTOGRAM W/LOWER EXTREMITY;  Surgeon: Angelia Mould, MD;  Location: [place] [facility];  Service: Cardiovascular;  Laterality: N/A;   AMPUTATION Left 07/08/2018   Procedure: AMPUTATION FORTH RAY LEFT FOOT;  Surgeon: [name], MD;  Location: [place];  Service: Orthopedics;  Laterality: Left;   AMPUTATION Left 08/09/2018   Procedure: Left Transmetatarsal Amputation;  Surgeon: [name], MD;  Location: [place];  Service: Orthopedics;  Laterality: Left;   AMPUTATION Left 10/08/2018   Procedure: LEFT BELOW KNEE AMPUTATION;  Surgeon: [name], MD;  Location: [place];  Service: Orthopedics;  Laterality: Left;   AMPUTATION Left 10/28/2018   Procedure: REVISION BELOW KNEE AMPUTATION;  Surgeon: [name], MD;  Location: [place];  Service: Orthopedics;  Laterality: Left;   AMPUTATION Left 12/16/2018   Procedure: LEFT ABOVE KNEE AMPUTATION;  Surgeon: [name], MD;  Location: [place];  Service: Orthopedics;  Laterality: Left;   AMPUTATION Right 01/31/2021   Procedure: AMPUTATION BELOW KNEE;  Surgeon:  Serafina Mitchell, MD;  Location: [facility];  Service: Vascular;  Laterality: Right;   AMPUTATION Right 03/28/2021   Procedure: REVISION OF RIGHT BELOW KNEE AMPUTATION;  Surgeon: Serafina Mitchell, MD;  Location: [place];  Service: Vascular;  Laterality: Right;   AMPUTATION Right 04/18/2021   Procedure: right below knee amputation/washout placement wound vac;  Surgeon: Cherre Robins, MD;  Location: [place];  Service: Vascular;  Laterality: Right;   AV FISTULA PLACEMENT Left 10/14/2018   Procedure: Arteriovenous (Av) Fistula Creation Left Arm;  Surgeon: [name], MD;  Location: [place];  Service: Vascular;  Laterality: Left;   [place] Left 04/14/2019   Procedure: BASILIC VEIN TRANSPOSITION SECOND STAGE LEFT ARM;  Surgeon: Rosetta Posner, MD;  Location: [place];  Service: Vascular;  Laterality: Left;   CORONARY STENT INTERVENTION N/A 05/21/2021   Procedure: CORONARY STENT INTERVENTION;  Surgeon: Leonie Man, MD;  Location: [place] [facility];  Service: Cardiovascular;  Laterality: N/A;   INCISION AND DRAINAGE ABSCESS N/A 10/24/2020   Procedure: exicision of hydradenitis;  Surgeon: Leighton Ruff, MD;  Location: [place];  Service: General;  Laterality: N/A;  45 min   INSERTION OF DIALYSIS CATHETER Right 04/14/2019   Procedure: INSERTION OF DIALYSIS CATHETER;  Surgeon: Rosetta Posner, MD;  Location: [facility];  Service: Vascular;  Laterality: Right;   LEFT HEART CATH AND CORONARY ANGIOGRAPHY N/A 05/21/2021   Procedure: LEFT HEART CATH AND CORONARY ANGIOGRAPHY;  Surgeon: Leonie Man, MD;  Location: [place] [facility];  Service: Cardiovascular;  Laterality: N/A;   LOWER EXTREMITY ANGIOGRAPHY N/A 07/05/2018   Procedure: LOWER EXTREMITY ANGIOGRAPHY;  Surgeon: Serafina Mitchell, MD;  Location: [place] [facility];  Service: Cardiovascular;  Laterality: N/A;   PERIPHERAL VASCULAR ATHERECTOMY Right 12/13/2020   Procedure: PERIPHERAL VASCULAR ATHERECTOMY;  Surgeon: Angelia Mould, MD;  Location: [place] [facility];  Service: Cardiovascular;  Laterality: Right;  Superficial femoral   PERIPHERAL VASCULAR BALLOON ANGIOPLASTY Left 07/05/2018   Procedure: PERIPHERAL VASCULAR BALLOON ANGIOPLASTY;  Surgeon: Serafina Mitchell, MD;  Location: [place] [facility];  Service: Cardiovascular;  Laterality: Left;  SFA   PERIPHERAL VASCULAR BALLOON ANGIOPLASTY Right 12/13/2020   Procedure: PERIPHERAL VASCULAR BALLOON ANGIOPLASTY;  Surgeon: Angelia Mould, MD;  Location: [place] [facility];  Service: Cardiovascular;  Laterality: Right;  Peroneal artery, anterior tibial artery   RECTAL EXAM UNDER ANESTHESIA N/A 10/24/2020   Procedure: EXAM UNDER ANESTHESIA;  Surgeon: Leighton Ruff, MD;  Location[facility];  Service: General;  Laterality: N/A;   STUMP REVISION Left 10/19/2018   Procedure: REVISION LEFT BELOW KNEE AMPUTATION;  Surgeon: [name], MD;  Location: [place];  Service: Orthopedics;  Laterality: Left;   TUBAL LIGATION  2002    Social:  Lives With: daughters in [place] Support: family Level of Function: independent of ADLs, uses wheelchair PCP: [facility], [name], MD Substances: quit smoking in 2016 (<1 pack/day since age 4);  no alcohol; does smoke marijuana a couple times a week   Family History: multiple family members with diabetes  Allergies: Allergies as of 10/12/2022   (No Known Allergies)   Review of Systems: A complete ROS was negative except as per HPI.   OBJECTIVE:   Physical Exam: Blood pressure 109/86, pulse 105, temperature 98 F (36.7 C), temperature source Oral, resp. rate 17, height '5\' 5"'$  (1.651 m), weight 78 kg, SpO2 100 % on 2L [place]. Constitutional: chronically ill appearing female sitting up in bed, diaphoretic, uncomfortable and in mild distress HENT: normocephalic atraumatic Cardiovascular: tachycardic, normal rhythm Pulmonary/Chest: mildly increased work of breathing on 2L [place], able to speak in short sentences,  lungs clear to auscultation bilaterally MSK: R BKA, L AKA  Neurological: alert & oriented x 3 Skin: warm and dry, LUE AV fistula with palpable thrill  Psych: anxious mood   Labs: CBC    Component Value Date/Time   WBC 10.1 10/12/2022 1720   RBC 1.97 (L) 10/12/2022 1720   HGB 6.1 (LL) 10/12/2022 1720   HGB 9.8 (L) 07/18/2018 1133   HCT 19.7 (L) 10/12/2022 1720   HCT 19.3 (L) 10/09/2018 1405   PLT 438 (H) 10/12/2022 1720   PLT 417 07/18/2018 1133   MCV 100.0 10/12/2022 1720   MCV 89 07/18/2018 1133   MCH 31.0 10/12/2022 1720   MCHC 31.0 10/12/2022 1720   RDW 15.1 10/12/2022 1720   RDW 12.9 07/18/2018 1133   LYMPHSABS 2.5 10/12/2022 1720   LYMPHSABS 1.6 12/23/2017 1558   MONOABS 0.8 10/12/2022 1720   EOSABS 0.2 10/12/2022 1720   EOSABS 0.2 12/23/2017 1558   BASOSABS 0.1 10/12/2022 1720   BASOSABS 0.1 12/23/2017 1558     CMP     Component Value Date/Time   NA 136 10/12/2022 1720   NA 138 12/23/2017 1558   K 6.5 (HH) 10/12/2022 1720   CL 94 (L) 10/12/2022 1720   CO2 15 (L) 10/12/2022 1720   GLUCOSE 209 (H) 10/12/2022 1720   BUN 121 (H) 10/12/2022 1720   BUN 28 (H) 12/23/2017 1558   CREATININE 9.10 (H) 10/12/2022 1720   CREATININE 12.07 (H) 05/29/2020 1155   CALCIUM 8.2 (L) 10/12/2022 1720   CALCIUM 8.5 (L) 11/25/2018 0309   PROT 7.0 10/12/2022 1720   PROT 7.3 04/23/2016 1535   ALBUMIN 2.7 (L) 10/12/2022 1720   ALBUMIN 3.4 (L) 04/23/2016 1535   AST 12 (L) 10/12/2022 1720   ALT 9 10/12/2022 1720   ALKPHOS 36 (L) 10/12/2022 1720   BILITOT 0.8 10/12/2022 1720   BILITOT 0.2 04/23/2016 1535   GFRNONAA 5 (L) 10/12/2022 1720   GFRNONAA 79 11/14/2014 1154   GFRAA 5 (L) 01/06/2020 1329   GFRAA >89 11/14/2014 1154    Imaging: DG Chest Portable 1 View FINDINGS: Heart size is upper limits of normal but similar to the previous examination. No overt pulmonary edema. No focal airspace disease. Atherosclerotic calcifications at the aortic arch. Trachea is midline.  Negative for a pneumothorax.  IMPRESSION: 1. No acute cardiopulmonary disease. 2. Aortic atherosclerosis.  EKG: personally reviewed my interpretation is sinus rhythm, rate 96, normal axis, peaked T waves V2-V4 not seen on prior EKG.   ASSESSMENT & PLAN:   Assessment & Plan by Problem: Active Problems:   Hyperkalemia   Kathy Frank is a 47 y.o. person living with a history of ESRD on HD TThS, T2DM with diabetic retinopathy, s/p left AKA and right BKA, HIV, HTN, HLD, PAD and secondary hyperparathyroidism  of renal origin who presented with SOB and admitted for hyperkalemia on hospital day 0  Hyperkalemia ESRD on HD TThS Presented with potassium of 6.5. EKG showed peaked T waves in anterior leads. Received calcium gluconate, insulin and sodium bicarb in ED. Patient is unclear if she is taking Lokelma. Has not missed HD session and completed full session on Saturday. Nephrology is following, plan for HD tonight.  -appreciate nephrology assistance -HD tonight -f/u renal function panel  -continue home calcitriol and calcium acetate  Normocytic Anemia Hgb 6.1 on arrival. Recent heavy menstrual cycle. Reportedly on epo injections weekly. Does take BC powder for her migraines which occur a few times a week. Last had 2 BC powders this morning. Patient is aware of avoiding BC powder but could only find that for relief. Denies any active bleeding. FOBT negative. Will transfuse and follow post H&H.  -transfuse 1 unit PRBC -f/u post H&H -transfusion protocol if Hgb <7 -advise patient to avoid further BC powder  Headache Hx of migraine Patient has hx of migraines. Only takes BC powder and not on any other medication. Triptans contraindicated due to hx of CVA. Appears discussion about Nurtec at prior hospitalization and to f/u in [facility] for prior authorization. -advise patient to avoid further BC powder  -tylenol 1000 mg q6h PRN -consider migraine cocktail if needed -will need outpatient f[facility] at  [facility]   T2DM A1c 6.3% in November. Home meds include Lantus 15 units qhs and Victoza 0.6 mg daily. Also on gabapentin 100 mg qhs.  -CBG monitoring ACHS -continue home Lantus 15 units qhs -continue home gabapentin   HTN HLD SBP in low 100s on arrival. Home meds include amlodipine-olmesartan 10-40 mg daily, atorvastatin 80 mg daily. Will hold home antihypertensive and consider restart if BP is elevated.  -hold amlodipine-olmesartan -continue home atorvastatin  HIV On Biktarvy. Follows with Dr. Megan Salon and her HIV has been undetectable.  -continue home Biktarvy    Diet: Renal VTE: None IVF: None,None Code: Full  Prior to Admission Living Arrangement: Home, living with daughters Anticipated Discharge Location: Home Barriers to Discharge: electrolyte derangement, anemia, medical stability  Dispo: Admit patient to Observation with expected length of stay less than 2 midnights.  Signed: Angelique Blonder, DO Internal Medicine Resident PGY-1 10/12/2022, 9:07 PM   Please contact on call pager, weekdays after 5pm and weekends after 1pm, at [phone].

## 2022-10-12 NOTE — ED Triage Notes (Addendum)
Pt brought in by EMS from home with complaints of SOB/chest tightness/headache x2 days, pain radiates across her chest, has stayed consistent the past 2 days. Pt is a T/TH/S dialysis and has not missed any treatments.   Pt received 1 nitro and 324asa en route. Nitro decreased pain from 8/10 down to a 6/10 but now pain is back to 7/10.   Pt placed on 2L [place] per EMS, pt was 98% on RA but did have some relief in SOB after oxygen was applied

## 2022-10-12 NOTE — Progress Notes (Signed)
1015 IMTS paged by dialysis RN. Brief episode where patient would not respond verbally and became stiff. Episode resolved and mentation improved slightly.   On assessment, vitals showed patient afebrile, HR 100s, BP 90-100/80 then 130-140/100, RR 20s, on 2L [place]. Patient appeared diaphoretic, uncomfortable and attempting to remove leads. Unclear how accurate vitals are since she is moving around and leads are coming off. Patient would answer briefly with 1-2 words then doze off. Answered to her name and able to state she was at [facility]. Patient unable to answer if she is in pain or discomfort after multiple attempts. Able to move all extremities. Did have another episode where she appeared to stiffen up and groaned, no jerking or rhythmic motions. No tongue biting or incontinence noted.   Will continue dialysis if able. RN to message nephrologist on-call. Blood transfusion has not started but will transfuse 1 unit PRBC. Will reassess and determine if further imaging is necessary.

## 2022-10-13 ENCOUNTER — Inpatient Hospital Stay (HOSPITAL_COMMUNITY): Payer: 59

## 2022-10-13 DIAGNOSIS — J449 Chronic obstructive pulmonary disease, unspecified: Secondary | ICD-10-CM | POA: Diagnosis not present

## 2022-10-13 DIAGNOSIS — N186 End stage renal disease: Secondary | ICD-10-CM

## 2022-10-13 DIAGNOSIS — Z992 Dependence on renal dialysis: Secondary | ICD-10-CM

## 2022-10-13 DIAGNOSIS — Z452 Encounter for adjustment and management of vascular access device: Secondary | ICD-10-CM | POA: Diagnosis not present

## 2022-10-13 DIAGNOSIS — E785 Hyperlipidemia, unspecified: Secondary | ICD-10-CM | POA: Diagnosis present

## 2022-10-13 DIAGNOSIS — K254 Chronic or unspecified gastric ulcer with hemorrhage: Secondary | ICD-10-CM | POA: Diagnosis not present

## 2022-10-13 DIAGNOSIS — E113393 Type 2 diabetes mellitus with moderate nonproliferative diabetic retinopathy without macular edema, bilateral: Secondary | ICD-10-CM | POA: Diagnosis present

## 2022-10-13 DIAGNOSIS — K922 Gastrointestinal hemorrhage, unspecified: Secondary | ICD-10-CM | POA: Diagnosis not present

## 2022-10-13 DIAGNOSIS — N250 Renal osteodystrophy: Secondary | ICD-10-CM | POA: Diagnosis not present

## 2022-10-13 DIAGNOSIS — I700 Atherosclerosis of aorta: Secondary | ICD-10-CM | POA: Diagnosis present

## 2022-10-13 DIAGNOSIS — E1151 Type 2 diabetes mellitus with diabetic peripheral angiopathy without gangrene: Secondary | ICD-10-CM | POA: Diagnosis present

## 2022-10-13 DIAGNOSIS — E1143 Type 2 diabetes mellitus with diabetic autonomic (poly)neuropathy: Secondary | ICD-10-CM | POA: Diagnosis present

## 2022-10-13 DIAGNOSIS — K2100 Gastro-esophageal reflux disease with esophagitis, without bleeding: Secondary | ICD-10-CM | POA: Diagnosis not present

## 2022-10-13 DIAGNOSIS — E8729 Other acidosis: Secondary | ICD-10-CM | POA: Diagnosis not present

## 2022-10-13 DIAGNOSIS — D649 Anemia, unspecified: Secondary | ICD-10-CM | POA: Diagnosis not present

## 2022-10-13 DIAGNOSIS — K250 Acute gastric ulcer with hemorrhage: Secondary | ICD-10-CM | POA: Diagnosis not present

## 2022-10-13 DIAGNOSIS — R079 Chest pain, unspecified: Secondary | ICD-10-CM | POA: Diagnosis not present

## 2022-10-13 DIAGNOSIS — F32A Depression, unspecified: Secondary | ICD-10-CM | POA: Diagnosis present

## 2022-10-13 DIAGNOSIS — Z1152 Encounter for screening for COVID-19: Secondary | ICD-10-CM | POA: Diagnosis not present

## 2022-10-13 DIAGNOSIS — I2510 Atherosclerotic heart disease of native coronary artery without angina pectoris: Secondary | ICD-10-CM | POA: Diagnosis not present

## 2022-10-13 DIAGNOSIS — G9341 Metabolic encephalopathy: Secondary | ICD-10-CM | POA: Diagnosis not present

## 2022-10-13 DIAGNOSIS — E875 Hyperkalemia: Secondary | ICD-10-CM | POA: Diagnosis not present

## 2022-10-13 DIAGNOSIS — I959 Hypotension, unspecified: Secondary | ICD-10-CM | POA: Diagnosis present

## 2022-10-13 DIAGNOSIS — Z87891 Personal history of nicotine dependence: Secondary | ICD-10-CM | POA: Diagnosis not present

## 2022-10-13 DIAGNOSIS — G9349 Other encephalopathy: Secondary | ICD-10-CM | POA: Diagnosis not present

## 2022-10-13 DIAGNOSIS — Z89612 Acquired absence of left leg above knee: Secondary | ICD-10-CM | POA: Diagnosis not present

## 2022-10-13 DIAGNOSIS — K259 Gastric ulcer, unspecified as acute or chronic, without hemorrhage or perforation: Secondary | ICD-10-CM | POA: Diagnosis not present

## 2022-10-13 DIAGNOSIS — I120 Hypertensive chronic kidney disease with stage 5 chronic kidney disease or end stage renal disease: Secondary | ICD-10-CM | POA: Diagnosis not present

## 2022-10-13 DIAGNOSIS — N2581 Secondary hyperparathyroidism of renal origin: Secondary | ICD-10-CM | POA: Diagnosis present

## 2022-10-13 DIAGNOSIS — Z794 Long term (current) use of insulin: Secondary | ICD-10-CM | POA: Diagnosis not present

## 2022-10-13 DIAGNOSIS — G9340 Encephalopathy, unspecified: Secondary | ICD-10-CM | POA: Diagnosis not present

## 2022-10-13 DIAGNOSIS — Z89511 Acquired absence of right leg below knee: Secondary | ICD-10-CM | POA: Diagnosis not present

## 2022-10-13 DIAGNOSIS — K921 Melena: Secondary | ICD-10-CM | POA: Diagnosis not present

## 2022-10-13 DIAGNOSIS — D500 Iron deficiency anemia secondary to blood loss (chronic): Secondary | ICD-10-CM

## 2022-10-13 DIAGNOSIS — E872 Acidosis, unspecified: Secondary | ICD-10-CM | POA: Diagnosis present

## 2022-10-13 DIAGNOSIS — R410 Disorientation, unspecified: Secondary | ICD-10-CM | POA: Diagnosis not present

## 2022-10-13 DIAGNOSIS — R0602 Shortness of breath: Secondary | ICD-10-CM | POA: Diagnosis not present

## 2022-10-13 DIAGNOSIS — B20 Human immunodeficiency virus [HIV] disease: Secondary | ICD-10-CM | POA: Diagnosis present

## 2022-10-13 DIAGNOSIS — D62 Acute posthemorrhagic anemia: Secondary | ICD-10-CM | POA: Diagnosis present

## 2022-10-13 DIAGNOSIS — E1122 Type 2 diabetes mellitus with diabetic chronic kidney disease: Secondary | ICD-10-CM | POA: Diagnosis not present

## 2022-10-13 DIAGNOSIS — D631 Anemia in chronic kidney disease: Secondary | ICD-10-CM | POA: Diagnosis not present

## 2022-10-13 DIAGNOSIS — Z79899 Other long term (current) drug therapy: Secondary | ICD-10-CM | POA: Diagnosis not present

## 2022-10-13 DIAGNOSIS — K319 Disease of stomach and duodenum, unspecified: Secondary | ICD-10-CM | POA: Diagnosis not present

## 2022-10-13 HISTORY — PX: IR US GUIDE VASC ACCESS RIGHT: IMG2390

## 2022-10-13 HISTORY — PX: IR FLUORO GUIDE CV LINE RIGHT: IMG2283

## 2022-10-13 HISTORY — DX: Acute gastric ulcer with hemorrhage: K25.0

## 2022-10-13 LAB — LACTATE DEHYDROGENASE: LDH: 327 U/L — ABNORMAL HIGH (ref 98–192)

## 2022-10-13 LAB — BLOOD GAS, ARTERIAL
Acid-Base Excess: 1.6 mmol/L (ref 0.0–2.0)
Bicarbonate: 24.6 mmol/L (ref 20.0–28.0)
Drawn by: 34762
O2 Saturation: 100 %
Patient temperature: 36.7
pCO2 arterial: 33 mmHg (ref 32–48)
pH, Arterial: 7.49 — ABNORMAL HIGH (ref 7.35–7.45)
pO2, Arterial: 126 mmHg — ABNORMAL HIGH (ref 83–108)

## 2022-10-13 LAB — GLUCOSE, CAPILLARY
Glucose-Capillary: 124 mg/dL — ABNORMAL HIGH (ref 70–99)
Glucose-Capillary: 217 mg/dL — ABNORMAL HIGH (ref 70–99)
Glucose-Capillary: 180 mg/dL — ABNORMAL HIGH (ref 70–99)
Glucose-Capillary: 191 mg/dL — ABNORMAL HIGH (ref 70–99)

## 2022-10-13 LAB — RENAL FUNCTION PANEL
Albumin: 2.9 g/dL — ABNORMAL LOW (ref 3.5–5.0)
Anion gap: 28 — ABNORMAL HIGH (ref 5–15)
BUN: 70 mg/dL — ABNORMAL HIGH (ref 6–20)
CO2: 16 mmol/L — ABNORMAL LOW (ref 22–32)
Calcium: 8.4 mg/dL — ABNORMAL LOW (ref 8.9–10.3)
Chloride: 93 mmol/L — ABNORMAL LOW (ref 98–111)
Creatinine, Ser: 5.57 mg/dL — ABNORMAL HIGH (ref 0.44–1.00)
GFR, Estimated: 9 mL/min — ABNORMAL LOW (ref >60)
Glucose, Bld: 225 mg/dL — ABNORMAL HIGH (ref 70–99)
Phosphorus: 6.3 mg/dL — ABNORMAL HIGH (ref 2.5–4.6)
Potassium: 4.1 mmol/L (ref 3.5–5.1)
Sodium: 137 mmol/L (ref 135–145)

## 2022-10-13 LAB — CBC
HCT: 21.4 % — ABNORMAL LOW (ref 36.0–46.0)
HCT: 20.3 % — ABNORMAL LOW (ref 36.0–46.0)
Hemoglobin: 7.2 g/dL — ABNORMAL LOW (ref 12.0–15.0)
Hemoglobin: 6.7 g/dL — CL (ref 12.0–15.0)
MCH: 31.2 pg (ref 26.0–34.0)
MCH: 30.3 pg (ref 26.0–34.0)
MCHC: 33.6 g/dL (ref 30.0–36.0)
MCHC: 33 g/dL (ref 30.0–36.0)
MCV: 92.6 fL (ref 80.0–100.0)
MCV: 91.9 fL (ref 80.0–100.0)
Platelets: 364 10*3/uL (ref 150–400)
Platelets: 342 10*3/uL (ref 150–400)
RBC: 2.31 MIL/uL — ABNORMAL LOW (ref 3.87–5.11)
RBC: 2.21 MIL/uL — ABNORMAL LOW (ref 3.87–5.11)
RDW: 16.8 % — ABNORMAL HIGH (ref 11.5–15.5)
RDW: 17 % — ABNORMAL HIGH (ref 11.5–15.5)
WBC: 16.6 10*3/uL — ABNORMAL HIGH (ref 4.0–10.5)
WBC: 15 10*3/uL — ABNORMAL HIGH (ref 4.0–10.5)
nRBC: 3.3 % — ABNORMAL HIGH (ref 0.0–0.2)
nRBC: 2.8 % — ABNORMAL HIGH (ref 0.0–0.2)

## 2022-10-13 LAB — HEMOGLOBIN AND HEMATOCRIT, BLOOD
HCT: 21.1 % — ABNORMAL LOW (ref 36.0–46.0)
Hemoglobin: 7.4 g/dL — ABNORMAL LOW (ref 12.0–15.0)

## 2022-10-13 LAB — COMPREHENSIVE METABOLIC PANEL
ALT: 83 U/L — ABNORMAL HIGH (ref 0–44)
AST: 109 U/L — ABNORMAL HIGH (ref 15–41)
Albumin: 2.9 g/dL — ABNORMAL LOW (ref 3.5–5.0)
Alkaline Phosphatase: 71 U/L (ref 38–126)
Anion gap: 23 — ABNORMAL HIGH (ref 5–15)
BUN: 79 mg/dL — ABNORMAL HIGH (ref 6–20)
CO2: 22 mmol/L (ref 22–32)
Calcium: 8.4 mg/dL — ABNORMAL LOW (ref 8.9–10.3)
Chloride: 93 mmol/L — ABNORMAL LOW (ref 98–111)
Creatinine, Ser: 6.1 mg/dL — ABNORMAL HIGH (ref 0.44–1.00)
GFR, Estimated: 8 mL/min — ABNORMAL LOW (ref >60)
Glucose, Bld: 197 mg/dL — ABNORMAL HIGH (ref 70–99)
Potassium: 3.9 mmol/L (ref 3.5–5.1)
Sodium: 138 mmol/L (ref 135–145)
Total Bilirubin: 0.6 mg/dL (ref 0.3–1.2)
Total Protein: 7 g/dL (ref 6.5–8.1)

## 2022-10-13 LAB — LACTIC ACID, PLASMA
Lactic Acid, Venous: 4.4 mmol/L (ref 0.5–1.9)
Lactic Acid, Venous: 1.7 mmol/L (ref 0.5–1.9)

## 2022-10-13 LAB — HEPATITIS B SURFACE ANTIGEN: Hepatitis B Surface Ag: NONREACTIVE

## 2022-10-13 LAB — PREPARE RBC (CROSSMATCH)

## 2022-10-13 MED ORDER — PANTOPRAZOLE INFUSION (NEW) - SIMPLE MED
8.0000 mg/h | INTRAVENOUS | Status: AC
  Administered 2022-10-13 – 2022-10-16 (×7): 8 mg/h via INTRAVENOUS
  Filled 2022-10-13 (×9): qty 100

## 2022-10-13 MED ORDER — PANTOPRAZOLE 80MG IVPB - SIMPLE MED
80.0000 mg | Freq: Once | INTRAVENOUS | Status: AC
  Administered 2022-10-13: 80 mg via INTRAVENOUS
  Filled 2022-10-13: qty 100

## 2022-10-13 MED ORDER — PROCHLORPERAZINE EDISYLATE 10 MG/2ML IJ SOLN
10.0000 mg | Freq: Once | INTRAMUSCULAR | Status: AC
  Administered 2022-10-13: 10 mg via INTRAVENOUS
  Filled 2022-10-13: qty 2

## 2022-10-13 MED ORDER — SODIUM CHLORIDE 0.9% IV SOLUTION: Freq: Once | INTRAVENOUS | Status: AC

## 2022-10-13 MED ORDER — ACETAMINOPHEN 650 MG RE SUPP: 650.0000 mg | Freq: Four times a day (QID) | RECTAL | Status: DC

## 2022-10-13 MED ORDER — DIPHENHYDRAMINE HCL 50 MG/ML IJ SOLN
25.0000 mg | Freq: Once | INTRAMUSCULAR | Status: AC
  Administered 2022-10-13: 25 mg via INTRAVENOUS
  Filled 2022-10-13: qty 1

## 2022-10-13 MED ORDER — SODIUM CHLORIDE 0.9% FLUSH: 10.0000 mL | INTRAVENOUS | Status: DC | PRN

## 2022-10-13 MED ORDER — SODIUM CHLORIDE 0.9 % IV SOLN
125.0000 mg | INTRAVENOUS | Status: DC
  Administered 2022-10-15: 125 mg via INTRAVENOUS
  Filled 2022-10-13 (×2): qty 10

## 2022-10-13 MED ORDER — DARBEPOETIN ALFA 200 MCG/0.4ML IJ SOSY
200.0000 ug | PREFILLED_SYRINGE | INTRAMUSCULAR | Status: DC
  Administered 2022-10-15: 200 ug via SUBCUTANEOUS
  Filled 2022-10-13: qty 0.4

## 2022-10-13 MED ORDER — LIDOCAINE-EPINEPHRINE 1 %-1:100000 IJ SOLN
INTRAMUSCULAR | Status: AC
  Administered 2022-10-13: 10 mL via INTRADERMAL
  Filled 2022-10-13: qty 1

## 2022-10-13 MED ORDER — PANTOPRAZOLE SODIUM 40 MG IV SOLR: 40.0000 mg | Freq: Two times a day (BID) | INTRAVENOUS | Status: DC

## 2022-10-13 MED ORDER — LACTATED RINGERS IV BOLUS: 250.0000 mL | Freq: Once | INTRAVENOUS | Status: DC

## 2022-10-13 MED ORDER — ACETAMINOPHEN 500 MG PO TABS
1000.0000 mg | ORAL_TABLET | Freq: Four times a day (QID) | ORAL | Status: DC
  Administered 2022-10-14 – 2022-10-16 (×2): 1000 mg via ORAL
  Filled 2022-10-13 (×5): qty 2

## 2022-10-13 NOTE — Progress Notes (Addendum)
Educated patient on the importance of blood sugar checks, telemetry and the indication of her medications. Patient has refused despite education at 2135, Notified [place], DO. Stated that patient is refusing blood sugar monitor check, night time medications and telemetry.

## 2022-10-13 NOTE — Progress Notes (Signed)
Unable to obtaine PIV as ordered, assessed with Ultrasound but no suitable vein found for PIV. Limited to Right forearm only, pt. Is a dialysis. RN at bedside. MD notified.

## 2022-10-13 NOTE — Progress Notes (Signed)
Post HD Tx   10/13/22 0045  Vitals  Temp 98.1 F (36.7 C)  Pulse Rate 60  Resp (!) 21  BP (!) 80/38  SpO2 100 %  O2 Device Room Air  Oxygen Therapy  Pulse Oximetry Type Continuous  Oximetry Probe Site Changed  (constant pt movement unable to keep in place)  Post Treatment  Dialyzer Clearance Other (Comment) (needle dislodged unable to return blood)  Duration of HD Treatment -hour(s) 3 hour(s)  Liters Processed 62.4  Fluid Removed (mL) 1700 mL  Tolerated HD Treatment  (see notes)  Post-Hemodialysis Comments see notes  AVG/AVF Arterial Site Held (minutes) 10 minutes  AVG/AVF Venous Site Held (minutes) 8 minutes

## 2022-10-13 NOTE — Progress Notes (Signed)
ABG sent to lab °

## 2022-10-13 NOTE — Procedures (Signed)
Vascular and Interventional Radiology Procedure Note  Patient: Kathy Frank DOB: [birth year] Medical Record Number: [id number] Note Date/Time: 10/13/22 1:09 PM   Performing Physician: Michaelle Birks, MD Assistant(s): None  Diagnosis: Poor IV access and ESRD Pt  Procedure: TUNNELED CENTRAL VENOUS "Powerline" CATHETER PLACEMENT  Anesthesia: Local Anesthetic Complications: None Estimated Blood Loss: Minimal Specimens:  None  Findings:  Successful placement of right-sided,  25 cm  (tip-to-cuff), tunneled "Powerline" catheter with the tip of the catheter in the proximal right atrium.  Plan: Catheter ready for use.  See detailed procedure note with images in PACS. The patient tolerated the procedure well without incident or complication and was returned to Floor Bed in stable condition.    Michaelle Birks, MD [facility] Radiology   Pager. [phone]

## 2022-10-13 NOTE — Progress Notes (Signed)
Clinical update:  Re-evaluated the patient at 0620 due to concern of uremic encephalopathy earlier in the evening that did not completely resolve after HD (see notes from Dr. Markus Jarvis at 11:14PM and 2:34 AM). The patient had an incomplete HD session, and repeat STAT renal function panel is still not collected to evaluate her renal function/BUN.   The patient is more awake and alert at this time. She is able to follow all of my commands and states that she feels better this morning, specifically in regards to her breathing. She has no focal deficits on her neuro exam. Of note, the patient had a large, black, tarry bowel movement at 0600. I suspect that her uremia/uremic encephalopathy was in the setting of a possible GI bleed, especially since the patient has not missed any HD sessions recently. Would consider calling GI this morning in the setting of probable GI bleed.   Buddy Duty, DO Internal Medicine Resident, PGY-2

## 2022-10-13 NOTE — Consult Note (Signed)
Consultation Note   Referring Provider:  Teaching Service.  PCP: [name], MD Primary Gastroenterologist: Althia Forts        Reason for consultation: [facility] Day: 2  ASSESSMENT  # Acute on chronic anemia / black stool / NSAID use.  47 yo female with multiple co-morbidities including ESRD on HD admitted with confusion, SHOB, chest pain and acute on chronic [place] anemia. Hgb 6.1, down from 9.9 late February. FOBT negative on admission but passed a large black BM this am. Reportedly takes BC powders at home.  Rule out PUD. AVM bleeding possible.  - - Hgb minimally improved to 7.2 after 1 u pRBCs - BUN down from 121 to 70 this am after HD last evening but she is still confused / restless and unable to provide history.. I contacted admitting team to see if there are plans for additional HD today?   -Troponin  37 > 35.  -CXR unrevealing.   # ESRD on HD  TThS  # HIV, on Biktarvy  # See PMH for additional medical history  PLAN - Keep npo - Repeat CBC, BMET.  - No IV access right now. IV team attempt unsuccessful.  - When stable / has IV access  she will need an EGD - Monitor hgb, may need another transfusion soon.      HPI:  Patient is a 47 y.o. year old female with multiple medical co-morbidities not limited ESR on HD, DM2, bilateral lower amputations, HIV , NSAID use  See PMH for any additional medical problems.  Patient admitted 3/4 for chest pain / SHOB / mental status changes. There was concern for uremic encephalopathy. Labs significant for hgb of 6.1, K+ 6.5, BUN 121, bicarb 15. Troponin elevated bu flat. FOBT negative.   [name] had a partial dialysis session last night. Unclear why BUN was so elevated as she hadn't missed any dialysis sessions and wasn't having any GI bleeding at the time. She went for emergent dialysis last night but it was only a partial session as   Unable to get any history from patient . She is  restless in bed. Doesn't answer questions.   Recent Imaging and Labs: DG Chest Portable 1 View  Result Date: 10/12/2022 CLINICAL DATA:  Chest pain and shortness of breath. EXAM: PORTABLE CHEST 1 VIEW COMPARISON:  10/03/2022 FINDINGS: Heart size is upper limits of normal but similar to the previous examination. No overt pulmonary edema. No focal airspace disease. Atherosclerotic calcifications at the aortic arch. Trachea is midline. Negative for a pneumothorax. IMPRESSION: 1. No acute cardiopulmonary disease. 2. Aortic atherosclerosis. Electronically Signed   By: Markus Daft M.D.   On: 10/12/2022 17:35   DG Chest Portable 1 View  Result Date: 10/03/2022 CLINICAL DATA:  Chest pain. EXAM: PORTABLE CHEST 1 VIEW COMPARISON:  09/29/2022 FINDINGS: Stable mild cardiomegaly. Aortic atherosclerotic calcification incidentally noted. Both lungs are clear. IMPRESSION: Stable mild cardiomegaly. No active lung disease. Electronically Signed   By: Marlaine Hind M.D.   On: 10/03/2022 10:51   DG Thoracic Spine 2 View  Result Date: 10/01/2022 CLINICAL DATA:  Back pain for 2 days EXAM: THORACIC SPINE 2 VIEWS COMPARISON:  CT from 1 hour previous FINDINGS: Vertebral body height is well  maintained similar to that seen on recent CT examination. No paraspinal mass lesion is noted. Pedicles are within normal limits. IMPRESSION: No acute abnormality noted in the thoracic spine. No change from the CT obtained 1 hour previous. Electronically Signed   By: Inez Catalina M.D.   On: 10/01/2022 22:57   CT CHEST WO CONTRAST  Result Date: 10/01/2022 CLINICAL DATA:  Severe right rib and chest pain and back pain. EXAM: CT CHEST WITHOUT CONTRAST TECHNIQUE: Multidetector CT imaging of the chest was performed following the standard protocol without IV contrast. RADIATION DOSE REDUCTION: This exam was performed according to the departmental dose-optimization program which includes automated exposure control, adjustment of the mA and/or kV  according to patient size and/or use of iterative reconstruction technique. COMPARISON:  Portable chest 09/29/2022; portable chest 09/27/2022; CT abdomen and pelvis without contrast 12/31/2018. FINDINGS: Cardiovascular: There is mild cardiomegaly. 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 No pericardial effusion. Coronary artery disease was not previously seen. There is age advanced diffuse aortic calcific disease, scattered calcification in the great vessels. There is no aortic aneurysm. The pulmonary arteries and veins are normal in caliber Mediastinum/Nodes: No enlarged mediastinal or axillary lymph nodes. Thyroid gland, trachea, and esophagus demonstrate no significant findings. There is a mildly elevated right hemidiaphragm chronically. Lungs/Pleura: There are increased linear atelectatic markings in both posterior basal lower lobes. There is increased bilateral lower lobe bronchial thickening. On the right there is additional posterior basal hazy opacity and band consolidation which be additional atelectasis or atelectasis intermixed with pneumonia. There is linear atelectasis in the base of the lingula and right middle lobe. Anteriorly in the right upper lobe there is a 4 mm noncalcified subpleural nodule on 8:43. There are no other visible nodules. Remaining lungs are clear.  No pleural effusion or pneumothorax. Upper Abdomen: Extensive vascular calcifications , increased from 12/31/2018. New bilateral renal atrophy. Musculoskeletal: No chest wall mass or suspicious bone lesions identified. An os acromiale is incidentally noted on the right. The visualized ribcage is intact. The lower ribcage is not fully included. IMPRESSION: 1. Mild cardiomegaly. 2. Age-advanced calcific CAD and aortic atherosclerosis not seen in 2020. 3. Bilateral lower lobe bronchial thickening consistent with bronchitis. 4. Right posterior basal hazy opacity and band consolidation which could be atelectasis or  atelectasis intermixed with pneumonia. 5. 4 mm right upper lobe nodule. Per Fleischner Society Guidelines, if patient is low risk for malignancy, no routine follow-up imaging is recommended. If patient is high risk for malignancy, a non-contrast Chest CT at 12 months is optional. If performed and the nodule is stable at 12 months, no further follow-up is recommended. These guidelines do not apply to immunocompromised patients and patients with cancer. Follow up in patients with significant comorbidities as clinically warranted. For lung cancer screening, adhere to Lung-RADS guidelines. Reference: Radiology. 2017; 284(1):228-43. 6. New bilateral renal atrophy. Extensive upper abdominal vascular calcifications. 7. The lower ribcage is not fully included. The visualized ribcage is intact. Aortic Atherosclerosis (ICD10-I70.0). Electronically Signed   By: Telford Nab M.D.   On: 10/01/2022 21:53   DG Chest 1 View  Result Date: 09/29/2022 CLINICAL DATA:  Shortness of breath.  Chest pain. EXAM: CHEST  1 VIEW COMPARISON:  09/27/2022 FINDINGS: Mild enlargement of the cardiopericardial silhouette. Mildly indistinct pulmonary vasculature suggesting pulmonary venous hypertension. Reduce conspicuity of the left hemidiaphragm in the retrocardiac region, raising suspicion for new retrocardiac airspace opacity in the left lower lobe. No blunting of the costophrenic angles. Atherosclerotic calcification of the  aortic arch. IMPRESSION: 1. Mild enlargement of the cardiopericardial silhouette with pulmonary venous hypertension but no overt edema. 2. Reduce conspicuity of the left hemidiaphragm in the retrocardiac region, raising suspicion for new airspace opacity in the left lower lobe, potentially from pneumonia or atelectasis. Electronically Signed   By: Van Clines M.D.   On: 09/29/2022 16:35   DG Lumbar Spine Complete  Result Date: 09/29/2022 CLINICAL DATA:  Shortness of breath.  Dialysis. EXAM: LUMBAR SPINE -  COMPLETE 4+ VIEW COMPARISON:  CT abdomen 12/31/2018 FINDINGS: Extensive vascular calcifications including the aortoiliac tree and renal arteries. Degenerative hip arthropathy bilaterally, right greater than left. Scattered stool in the colon.  No dilated bowel observed. No lumbar fracture or malalignment. Preserved intervertebral articular spaces. IMPRESSION: 1. No acute lumbar spine findings. 2. Extensive vascular calcifications. 3. Degenerative hip arthropathy bilaterally, right greater than left. Electronically Signed   By: Van Clines M.D.   On: 09/29/2022 16:28   DG Chest 1 View  Result Date: 09/27/2022 CLINICAL DATA:  47 year old female with history of shortness of breath. EXAM: CHEST  1 VIEW COMPARISON:  Chest x-ray 09/15/2022. FINDINGS: Lung volumes are normal. No consolidative airspace disease. No pleural effusions. No pneumothorax. No pulmonary nodule or mass noted. Pulmonary vasculature and the cardiomediastinal silhouette are within normal limits. Atherosclerosis in the thoracic aorta. IMPRESSION: 1. No radiographic evidence of acute cardiopulmonary disease. 2. Aortic atherosclerosis. Electronically Signed   By: Vinnie Langton M.D.   On: 09/27/2022 18:54   CT Head Wo Contrast  Result Date: 09/15/2022 CLINICAL DATA:  Initial evaluation for headache. EXAM: CT HEAD WITHOUT CONTRAST TECHNIQUE: Contiguous axial images were obtained from the base of the skull through the vertex without intravenous contrast. RADIATION DOSE REDUCTION: This exam was performed according to the departmental dose-optimization program which includes automated exposure control, adjustment of the mA and/or kV according to patient size and/or use of iterative reconstruction technique. COMPARISON:  Comparison made with prior CT from 03/09/2022. FINDINGS: Brain: Cerebral volume within normal limits. Patchy hypodensity involving the right basal ganglia and periatrial white matter, consistent with prior ischemic infarcts.  There is an additional 1 cm hypodensity involving the posterior right frontal corona radiata, consistent with a prior lacunar infarct. This is new from prior, but favored to be chronic in nature. No acute intracranial hemorrhage. No visible acute large vessel territory infarct. No mass lesion or midline shift. No hydrocephalus or extra-axial fluid collection. Cavum et septum pellucidum noted. Vascular: No abnormal hyperdense vessel. Age advanced calcified atherosclerosis about the skull base. Skull: Scalp soft tissues and calvarium within normal limits. Sinuses/Orbits: Globes and orbital soft tissues within normal limits. Mild scattered mucoperiosteal thickening present about the sphenoid ethmoidal and maxillary sinuses, greater on the left. Mastoid air cells are clear. Other: None. IMPRESSION: 1. No acute intracranial abnormality. 2. Multiple prior ischemic infarcts involving the right basal ganglia and periatrial white matter, stable. Additional 1 cm hypodensity involving the posterior right frontal corona radiata, consistent with a prior lacunar infarct, new from prior, but favored to be chronic in nature. 3. Age advanced calcified atherosclerosis about the skull base. Electronically Signed   By: Jeannine Boga M.D.   On: 09/15/2022 20:51   DG Chest 2 View  Result Date: 09/15/2022 CLINICAL DATA:  Fatigue, cough, and weakness. EXAM: CHEST - 2 VIEW COMPARISON:  Chest x-ray dated June 25, 2022. FINDINGS: Unchanged mild cardiomegaly. Normal pulmonary vascularity. No focal consolidation, pleural effusion, or pneumothorax. Insert IMPRESSION: No active cardiopulmonary disease. Electronically Signed  By: Titus Dubin M.D.   On: 09/15/2022 16:59      Labs:  Recent Labs    10/12/22 1720 10/13/22 0619  WBC 10.1 16.6*  HGB 6.1* 7.2*  HCT 19.7* 21.4*  PLT 438* 364   Recent Labs    10/12/22 1720 10/13/22 0619  NA 136 137  K 6.5* 4.1  CL 94* 93*  CO2 15* 16*  GLUCOSE 209* 225*  BUN 121*  70*  CREATININE 9.10* 5.57*  CALCIUM 8.2* 8.4*   Recent Labs    10/12/22 1720 10/13/22 0619  PROT 7.0  --   ALBUMIN 2.7* 2.9*  AST 12*  --   ALT 9  --   ALKPHOS 36*  --   BILITOT 0.8  --    Recent Labs    10/13/22 0619  HEPBSAG NON REACTIVE   Recent Labs    10/12/22 1834  LABPROT 15.6*  INR 1.3*    Past Medical History:  Diagnosis Date   Anal abscess    chronic   Anxiety    CAD (coronary artery disease)    CVA (cerebral vascular accident) ([place])    Depression 06/28/2006   Qualifier: Diagnosis of  By: Riccardo Dubin MD, Todd     Diabetes mellitus type 2 in obese ([place]) 06/28/1994   Dyspnea    uses oxygeb 2 liters per minute at dialysis   End stage renal disease on dialysis [facility])    on hemodialysis T/Th/Sat   Erosive esophagitis    Esophageal reflux    Eye redness    Gastroparesis    ? diabetic   GERD (gastroesophageal reflux disease)    History of blood transfusion 2019   Human immunodeficiency virus (HIV) disease ([place]) 04/23/2016   Hyperlipidemia    Hypertension    Metabolic bone disease [id number]   Moderate nonproliferative diabetic retinopathy of both eyes ([place]) 11/21/2014   11/14/14: Noted on retinal imaging; needs follow-up imaging in 6 months  05/22/16: Noted on retinal imaging again; needs follow-up imaging in 6 months   PAD (peripheral artery disease) ([place])    Type 2 diabetes mellitus with diabetic peripheral angiopathy without gangrene ([place]) 05/01/2019   Wears dentures    lower   Wound infection s/p L transmetatarsal amputation     Past Surgical History:  Procedure Laterality Date   ABDOMINAL AORTOGRAM W/LOWER EXTREMITY N/A 12/13/2020   Procedure: ABDOMINAL AORTOGRAM W/LOWER EXTREMITY;  Surgeon: Angelia Mould, MD;  Location: [place] [facility];  Service: Cardiovascular;  Laterality: N/A;   AMPUTATION Left 07/08/2018   Procedure: AMPUTATION FORTH RAY LEFT FOOT;  Surgeon: [name], MD;  Location: [place];  Service: Orthopedics;  Laterality:  Left;   AMPUTATION Left 08/09/2018   Procedure: Left Transmetatarsal Amputation;  Surgeon: [name], MD;  Location: [place];  Service: Orthopedics;  Laterality: Left;   AMPUTATION Left 10/08/2018   Procedure: LEFT BELOW KNEE AMPUTATION;  Surgeon: [name], MD;  Location: [place];  Service: Orthopedics;  Laterality: Left;   AMPUTATION Left 10/28/2018   Procedure: REVISION BELOW KNEE AMPUTATION;  Surgeon: [name], MD;  Location: [place];  Service: Orthopedics;  Laterality: Left;   AMPUTATION Left 12/16/2018   Procedure: LEFT ABOVE KNEE AMPUTATION;  Surgeon: [name], MD;  Location: [place];  Service: Orthopedics;  Laterality: Left;   AMPUTATION Right 01/31/2021   Procedure: AMPUTATION BELOW KNEE;  Surgeon: Serafina Mitchell, MD;  [facility];  Service: Vascular;  Laterality: Right;   AMPUTATION  Right 03/28/2021   Procedure: REVISION OF RIGHT BELOW KNEE AMPUTATION;  Surgeon: Serafina Mitchell, MD;  Location: [place];  Service: Vascular;  Laterality: Right;   AMPUTATION Right 04/18/2021   Procedure: right below knee amputation/washout placement wound vac;  Surgeon: Cherre Robins, MD;  Location: [name];  Service: Vascular;  Laterality: Right;   AV FISTULA PLACEMENT Left 10/14/2018   Procedure: Arteriovenous (Av) Fistula Creation Left Arm;  Surgeon: [name], MD;  Location: [place];  Service: Vascular;  Laterality: Left;   [place] Left 04/14/2019   Procedure: BASILIC VEIN TRANSPOSITION SECOND STAGE LEFT ARM;  Surgeon: Rosetta Posner, MD;  Location: [place];  Service: Vascular;  Laterality: Left;   CORONARY STENT INTERVENTION N/A 05/21/2021   Procedure: CORONARY STENT INTERVENTION;  Surgeon: Leonie Man, MD;  Location: [place] [facility];  Service: Cardiovascular;  Laterality: N/A;   INCISION AND DRAINAGE ABSCESS N/A 10/24/2020   Procedure: exicision of hydradenitis;  Surgeon: Leighton Ruff, MD;  Location: [place];  Service: General;   Laterality: N/A;  45 min   INSERTION OF DIALYSIS CATHETER Right 04/14/2019   Procedure: INSERTION OF DIALYSIS CATHETER;  Surgeon: Rosetta Posner, MD;  [facility];  Service: Vascular;  Laterality: Right;   LEFT HEART CATH AND CORONARY ANGIOGRAPHY N/A 05/21/2021   Procedure: LEFT HEART CATH AND CORONARY ANGIOGRAPHY;  Surgeon: Leonie Man, MD;  Location: [place] [facility];  Service: Cardiovascular;  Laterality: N/A;   LOWER EXTREMITY ANGIOGRAPHY N/A 07/05/2018   Procedure: LOWER EXTREMITY ANGIOGRAPHY;  Surgeon: Serafina Mitchell, MD;  Location: [place] [facility];  Service: Cardiovascular;  Laterality: N/A;   PERIPHERAL VASCULAR ATHERECTOMY Right 12/13/2020   Procedure: PERIPHERAL VASCULAR ATHERECTOMY;  Surgeon: Angelia Mould, MD;  Location: [place] [facility];  Service: Cardiovascular;  Laterality: Right;  Superficial femoral   PERIPHERAL VASCULAR BALLOON ANGIOPLASTY Left 07/05/2018   Procedure: PERIPHERAL VASCULAR BALLOON ANGIOPLASTY;  Surgeon: Serafina Mitchell, MD;  Location: [place] [facility];  Service: Cardiovascular;  Laterality: Left;  SFA   PERIPHERAL VASCULAR BALLOON ANGIOPLASTY Right 12/13/2020   Procedure: PERIPHERAL VASCULAR BALLOON ANGIOPLASTY;  Surgeon: Angelia Mould, MD;  Location: [place] [facility];  Service: Cardiovascular;  Laterality: Right;  Peroneal artery, anterior tibial artery   RECTAL EXAM UNDER ANESTHESIA N/A 10/24/2020   Procedure: EXAM UNDER ANESTHESIA;  Surgeon: Leighton Ruff, MD;  [facility];  Service: General;  Laterality: N/A;   STUMP REVISION Left 10/19/2018   Procedure: REVISION LEFT BELOW KNEE AMPUTATION;  Surgeon: [name], MD;  Location: [place];  Service: Orthopedics;  Laterality: Left;   TUBAL LIGATION  2002    Family History  Problem Relation Age of Onset   Diabetes Mother    Diabetes Brother    Diabetes Daughter    Diabetes Daughter    Mental retardation Brother        died from PNA   Diabetes  Maternal Grandmother     Prior to Admission medications   Medication Sig Start Date End Date Taking? Authorizing Provider  Accu-Chek Softclix Lancets lancets Use as instructed 05/02/21   Atway, Rayann N, DO  albuterol (VENTOLIN HFA) 108 (90 Base) MCG/ACT inhaler Inhale 2 puffs into the lungs every 4 (four) hours as needed for wheezing or shortness of breath. 02/14/21   Angiulli, Lavon Paganini, PA-C  amLODipine-olmesartan (AZOR) 10-40 MG tablet Take 1 tablet by mouth every evening. 10/02/22 11/01/22  Gawaluck,  Rachell Cipro, MD  atorvastatin (LIPITOR) 80 MG tablet Take 1 tablet (80 mg total) by mouth daily. Patient not taking: Reported on 06/28/2022 05/24/21   Masters, Joellen Jersey, DO  bictegravir-emtricitabine-tenofovir AF (BIKTARVY) 50-200-25 MG TABS tablet Take 1 tablet by mouth daily. OVERDUE FOR OFFICE FOLLOW UP - PLEASE CALL OUR OFFICE TO SCHEDULE [phone] 08/26/22   Michel Bickers, MD  calcitRIOL (ROCALTROL) 0.25 MCG capsule Take 5 capsules (1.25 mcg total) by mouth Every Tuesday,Thursday,and Saturday with dialysis. 07/02/22   Rick Duff, MD  calcium acetate (PHOSLO) 667 MG capsule Take 2 capsules (1,334 mg total) by mouth 3 (three) times daily with meals. Patient taking differently: Take 2,001 mg by mouth 3 (three) times daily with meals. 06/30/22   Rick Duff, MD  Continuous Blood Gluc Receiver (DEXCOM G7 RECEIVER) DEVI Use to check blood sugars cntinuously 06/30/22   Rick Duff, MD  Continuous Blood Gluc Sensor (DEXCOM G7 SENSOR) MISC Use to check blood sugar continuously 06/30/22   Rick Duff, MD  cyclobenzaprine (FLEXERIL) 5 MG tablet Take 1 tablet (5 mg total) by mouth 2 (two) times daily as needed for muscle spasms. 10/02/22 11/01/22  Delene Ruffini, MD  Darbepoetin Alfa (ARANESP) 200 MCG/0.4ML SOSY injection Inject 0.4 mLs (200 mcg total) into the skin every Monday at 6 PM. 07/06/22   Rick Duff, MD  diclofenac Sodium (VOLTAREN) 1 % GEL Apply 4 g topically 4 (four)  times daily. 10/02/22   Delene Ruffini, MD  EQ ASPIRIN ADULT LOW DOSE 81 MG tablet TAKE 1 TABLET BY MOUTH ONCE DAILY. SWALLOW WHOLE. Patient not taking: Reported on 09/29/2022 02/06/22   Waynetta Sandy, MD  gabapentin (NEURONTIN) 100 MG capsule Take 100 mg by mouth at bedtime. 09/10/22   [provider]  glucose blood test strip Use as instructed 05/02/21   Atway, Rayann N, DO  insulin glargine (LANTUS SOLOSTAR) 100 UNIT/ML Solostar Pen Inject 15 Units into the skin at bedtime. 06/30/22   Rick Duff, MD  Insulin Pen Needle (PENTIPS) 32G X 4 MM MISC Use as directed 02/14/21   Meredith Staggers, MD  lidocaine (LIDODERM) 5 % Place 1 patch onto the skin daily. Remove & Discard patch within 12 hours or as directed by MD 10/03/22   Delene Ruffini, MD  liraglutide (VICTOZA) 18 MG/3ML SOPN Inject 0.6 mg into the skin daily. 06/30/22 06/25/23  Rick Duff, MD  [facility] 5 g packet Take 5 g by mouth daily. 07/30/22   [provider]  multivitamin (RENA-VIT) TABS tablet Take 1 tablet by mouth daily. Patient taking differently: Take 1 tablet by mouth See admin instructions. On Tuesday, Wednesday and Thursday 02/14/21   Angiulli, Lavon Paganini, PA-C  oxyCODONE (ROXICODONE) 5 MG immediate release tablet Take 1 tablet (5 mg total) by mouth every 6 (six) hours as needed for severe pain. 10/03/22   Dorie Rank, MD  polyethylene glycol (MIRALAX / GLYCOLAX) 17 g packet Take 17 g by mouth daily. Patient taking differently: Take 17 g by mouth daily as needed for moderate constipation or mild constipation. 07/01/22   Rick Duff, MD    Current Facility-Administered Medications  Medication Dose Route Frequency Provider Last Rate Last Admin   0.9 %  sodium chloride infusion (Manually program via Guardrails IV Fluids)   Intravenous Once Elgie Congo, MD       acetaminophen (TYLENOL) tablet 1,000 mg  1,000 mg Oral Q6H Johny Blamer, DO       Or   acetaminophen (TYLENOL)  suppository 650 mg  650  mg Rectal Q6H Johny Blamer, DO       albuterol (PROVENTIL) (2.5 MG/3ML) 0.083% nebulizer solution 2.5 mg  2.5 mg Inhalation Q4H PRN Atway, Rayann N, DO       atorvastatin (LIPITOR) tablet 80 mg  80 mg Oral Daily Atway, Rayann N, DO       bictegravir-emtricitabine-tenofovir AF (BIKTARVY) 50-200-25 MG per tablet 1 tablet  1 tablet Oral Daily Atway, Rayann N, DO       calcitRIOL (ROCALTROL) capsule 1.25 mcg  1.25 mcg Oral Q T,Th,Sa-HD Atway, Rayann N, DO       calcium acetate (PHOSLO) capsule 2,001 mg  2,001 mg Oral TID WC Atway, Rayann N, DO       Chlorhexidine Gluconate Cloth 2 % PADS 6 each  6 each Topical Q0600 Dwana Melena, MD   6 each at 10/13/22 0533   gabapentin (NEURONTIN) capsule 100 mg  100 mg Oral QHS Atway, Rayann N, DO       insulin glargine-yfgn (SEMGLEE) injection 15 Units  15 Units Subcutaneous QHS Atway, Rayann N, DO   15 Units at 10/13/22 0200   lactated ringers bolus 250 mL  250 mL Intravenous Once Atway, Rayann N, DO       multivitamin (RENA-VIT) tablet 1 tablet  1 tablet Oral Once per day on Tue Wed Thu Atway, Rayann N, DO       senna-docusate (Senokot-S) tablet 1 tablet  1 tablet Oral QHS Angelique Blonder, DO       sodium zirconium cyclosilicate (LOKELMA) packet 5 g  5 g Oral Daily Atway, Rayann N, DO        Allergies as of 10/12/2022   (No Known Allergies)    Social History   Socioeconomic History   Marital status: Single    Spouse name: Not on file   Number of children: Not on file   Years of education: 10   Highest education level: Not on file  Occupational History    Employer: UNEMPLOYED  Tobacco Use   Smoking status: Former    Packs/day: 0.10    Years: 10.00    Total pack years: 1.00    Types: Cigarettes    Start date: 11/17/2013    Quit date: 09/09/2014    Years since quitting: 8.0   Smokeless tobacco: Never  Vaping Use   Vaping Use: Never used  Substance and Sexual Activity   Alcohol use: Not Currently   Drug use: Yes     Types: Marijuana    Comment: 2 times a monthy -  `last time Juily 2022   Sexual activity: Not Currently    Partners: Female, Female  Other Topics Concern   Not on file  Social History Narrative   Not on file   Social Determinants of Health   Financial Resource Strain: Not on file  Food Insecurity: Not on file  Transportation Needs: Not on file  Physical Activity: Not on file  Stress: Not on file  Social Connections: Not on file  Intimate Partner Violence: Not on file    Review of Systems: Unable to obtain due to mental status  Physical Exam: Vital signs in last 24 hours: Temp:  [98 F (36.7 C)-98.1 F (36.7 C)] 98 F (36.7 C) (03/05 0150) Pulse Rate:  [51-107] 98 (03/05 0800) Resp:  [16-23] 16 (03/05 0800) BP: (76-142)/(35-121) 99/50 (03/05 0800) SpO2:  [84 %-100 %] 95 % (03/05 0800) Weight:  [78 kg] 78 kg (03/04 1653)    General:  Alert ,  restless female in NAD Eyes: Pupils equal Ears:  Normal auditory acuity Nose: No deformity, discharge or lesions Neck:  Supple, no masses felt Lungs:  Clear to auscultation.  Heart:  Regular rate, regular rhythm.  Abdomen:  Limited exam given restless state. Abdomen is soft, nondistended, nontender, active bowel sounds Rectal :  Deferred Extremities : Bilateral AKA     Intake/Output from previous day: 03/04 0701 - 03/05 0700 In: 358.4 [Blood:315; IV Piggyback:43.4] Out: 1700  Intake/Output this shift:  No intake/output data recorded.    Active Problems:   Hyperkalemia    Tye Savoy, NP-C @  10/13/2022, 9:23 AM

## 2022-10-13 NOTE — Procedures (Signed)
Tx ended due to needle dislodgement and unsafe condition of patient, previously reported to provider that patient would not keep still with needles in, kept flailing arms, sitting up, throwing herself back and slouching from side to side, unable to maintain tele leads on pt, change 4 times during tx, nor the o2 sensor. Pt at times would not respond and have clenched teeth and clenched fists, originally thought to may have been a seizure rapid response was called at the first instance of that behavior. B/P was low with HD tx, unable to get accurate readings because of pt movement. Report provided to receiving unit 5W and currently waiting for transport.

## 2022-10-13 NOTE — Progress Notes (Addendum)
HD#0 Subjective:   Summary: Kathy Frank is a 47 y.o. 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   Overnight Events: Overnight patient was admitted, please see admission note for full details.  Of note she went to emergent HD for hyperkalemia and uremic encephalopathy.  During HD she became more encephalopathic and early in the morning she had 2 melenic stools with persistent encephalopathy.  This morning on initial evaluation the patient had just had her second melenic stool and was not very responsive.  She was alert to person and place but did not answer verbally to most questions.  She was not having any pain at this time but continues to have a headache that worsened throughout the day.  Headache was consistent with her prior migraines at home.  As the day went on she continued to do better and also had continued fatigue and headache but  stated that she felt better.  She did continue to have multiple stools throughout the day.  Objective:  Vital signs in last 24 hours: Vitals:   10/13/22 0350 10/13/22 0400 10/13/22 0425 10/13/22 0615  BP: 106/75 95/74 (!) 106/48 (!) 99/51  Pulse:      Resp:      Temp:      TempSrc:      SpO2:      Weight:      Height:       Supplemental O2: Room Air SpO2: 96 % O2 Flow Rate (L/min): 2 L/min   Physical Exam:  Constitutional: Uncomfortable appearing female hunched over in bed Pulmonary/Chest: Increased work of breathing with no focal abnormalities on lung exam and good air movement overall Abdominal: Unable to assess  MSK: Stable bilateral lower extremity amputations Neurological: Oriented to person and place consistently, no focal deficits noted on exam Skin: warm and dry, no coolness or pallor noted  Filed Weights   10/12/22  1653  Weight: 78 kg      Intake/Output Summary (Last 24 hours) at 10/13/2022 0649 Last data filed at 10/13/2022 0045 Gross per 24 hour  Intake 358.4 ml  Output 1700 ml  Net -1341.6 ml   Net IO Since Admission: -1,341.6 mL [10/13/22 0649]  Pertinent Labs:    Latest Ref Rng & Units 10/13/2022   10:48 AM 10/13/2022    6:19 AM 10/12/2022    5:20 PM  CBC  WBC 4.0 - 10.5 K/uL 15.0  16.6  10.1   Hemoglobin 12.0 - 15.0 g/dL 6.7  7.2  6.1   Hematocrit 36.0 - 46.0 % 20.3  21.4  19.7   Platelets 150 - 400 K/uL 342  364  438        Latest Ref Rng & Units 10/13/2022   10:48 AM 10/13/2022    6:19 AM 10/12/2022    5:20 PM  CMP  Glucose 70 - 99 mg/dL 197  225  209   BUN 6 - 20 mg/dL 79  70  121   Creatinine 0.44 - 1.00 mg/dL 6.10  5.57  9.10   Sodium 135 - 145 mmol/L 138  137  136   Potassium 3.5 - 5.1 mmol/L 3.9  4.1  6.5   Chloride 98 - 111 mmol/L 93  93  94   CO2 22 - 32 mmol/L 22  16  15   Calcium 8.9 - 10.3 mg/dL 8.4  8.4  8.2   Total Protein 6.5 - 8.1 g/dL 7.0   7.0   Total Bilirubin 0.3 - 1.2 mg/dL 0.6   0.8   Alkaline Phos 38 - 126 U/L 71   36   AST 15 - 41 U/L 109   12   ALT 0 - 44 U/L 83   9     Assessment/Plan:   Active Problems:   Hyperkalemia   Patient Summary: Kathy Frank is a 47 y.o. female with pertinent PMH of ESRD on HD TTS, T2DM on insulin, PAD s/p left AKA and right BKA, HIV, HTN, and secondary hyperparathyroidism who presented with progressive dyspnea and fatigue and is admitted for uremic encephalopathy with upper GI bleeding, on hospital day 0   C/f upper GI bleed Normocytic anemia Presented with hemoglobin of 6.1 and had multiple melenic stools after admission.  She received a unit of blood with posttransfusion H&H of 7.2 and recheck later in the day of 6.7.  She remained hemodynamically stable with improving mentation overall.  She received a unit of blood on admission and is currently receiving her second unit.Marland Kitchen  GI was consulted for consideration of EGD,  they plan to do this when the patient is more stable so we will continue to work with them.  There was trouble with keeping a peripheral line along with bilateral AV fistulas so we had IR place a central line.  Access issues delayed the second unit of blood. - Pending EGD timing, patient remain n.p.o. - H&H every 6 hours - Transfuse for hemoglobin less than 7  Uremic encephalopathy Anion gap metabolic acidosis Hyperkalemia, resolved ESRD on HD TTS With her worsening mental status this morning there was concern for neurologic process.  CT of the head without contrast not show any acute abnormalities and patient did clear throughout the day.  Lower concern for meningitis or encephalitis however with HIV status and persistent encephalopathy after dialysis if she is to revert or has any signs of infection we will consider an LP.  Patient went for emergent hemodialysis last night with improvement in uremia, BUN 79.  Anion gap remains elevated at 23 with bicarb improved to 22.  Patient also developed a lactic acidosis with lactate of 4.4 that has improved to 1.7.  After dialysis there are no severe electrolyte derangements and nephrology plans to monitor the patient with next dialysis on 3/7.  Family did note that some of her dialysis sessions recently have been running short but we also think that her upper GI bleed is a large contributor to her uremia.  We will treat her upper GI bleed and continue to work with nephrology on dialysis. - HD per nephrology, next HD 3/7 - Daily renal function panel - Continue home [place]  Headache with history of migraines Patient has headaches at home frequently and has been taking BC powder for these at home.  This is likely a contributor to her upper GI bleed.  She continues to have a worsening headache here that is not responsive to Tylenol.  CT of the head here without hemorrhage or acute abnormalities.  We will try a headache cocktail of diphenhydramine and  prochlorperazine.  Type 2 diabetes -Continue home Lantus 15 units daily and gabapentin 100 mg daily  Hypertension Hyperlipidemia We will continue to hold her home amlodipine/olmesartan in the setting of low blood pressures but will continue her home atorvastatin 80 mg daily.  HIV  Continue home [place].  Diet: NPO IVF: None, VTE: None Code: Full   Dispo: Pending stabilization of upper GI bleed and uremic encephalopathy.  Johny Blamer, DO Internal Medicine Resident PGY-1 Pager: [phone]  Please contact the on call pager after 5 pm and on weekends at [phone].

## 2022-10-13 NOTE — Progress Notes (Signed)
   10/13/22 1130  Provider Notification  Provider Name/Title Lau,Grace MD and team  Date Provider Notified 10/13/22  Time Provider Notified 1130  Method of Notification Page (Secure chat)  Notification Reason Critical Result  Test performed and critical result Hemoglobin 6.7  Date Critical Result Received 10/13/22  Time Critical Result Received 1130  Provider response See new orders  Date of Provider Response 10/13/22  Time of Provider Response 1131

## 2022-10-13 NOTE — Progress Notes (Addendum)
0200 IMTS reassessed patient after dialysis. Session ended early due to needle dislodgement and patient unable to remain still. HD note stated 1700 ml fluid removed. Patient also received 1 unit PRBC.   On reassessment, patient was hypotensive 80-90/40s with MAP upper 50 to low 60, HR 80-90, RR 18-20 with SpO2 low 90s on RA. She was loudly snoring on arrival. Difficult to arouse by voice or painful stimuli. Will briefly open her eyes to voice. Unable to follow commands. Able to move all extremities.   Concern for uremic encephalopathy. Inital BUN 121 along with bicarb 15. Will repeat STAT labs. Order for LR 250 ml bolus.    0345 Addendum: Re-evaluated patient at bedside. Patient awakens to voice but still drowsy. Answers some questions. Oriented to person and place. Will follow commands. Neuro exam limited due to patient's somnolence. Able to open eyes and move her tongue. Her grip strength is grossly intact. Able to sit up and move upper extremities. SBP improved to 110 with MAP 84. HR 90s.   Pending STAT labs. BP and MAP improved without IVF. Has not received IVF bolus pending PIV placement. Will await lab results to see if improvement in her uremia and acidosis that could be contributing to her encephalopathy. If improved, may need to consider head imaging given acute mental status change.

## 2022-10-13 NOTE — Progress Notes (Signed)
Patient refused to have mitt restraints.

## 2022-10-13 NOTE — Progress Notes (Signed)
Unable to obtain PIV as ordered, assessed w/ Ultrasound, no suitable vein found for access. Limited to right forearm only, pt. Is a dialysis. RN at bedside, MD notified.

## 2022-10-13 NOTE — Progress Notes (Signed)
[facility] Progress Note   Subjective: Seen in room, sitter at bedside. She does open eyes to verbal stimuli. Follows some commands but very restless, thrashing around in bed. Oriented to person and place.       Objective Vitals:   10/13/22 0425 10/13/22 0615 10/13/22 0800 10/13/22 1038  BP: (!) 106/48 (!) 99/51 (!) 99/50 114/71  Pulse:   98   Resp:   16 20  Temp:      TempSrc:      SpO2:   95%   Weight:      Height:       Physical Exam General: Chronically ill appearing female in NAD Heart: S1,S2 RRR SR/ST on monitor. No M/R/G Lungs: CTAB A/P Abdomen: Soft, NABS Extremities: R BKA, L AKA. No stump edema Dialysis Access: L AVF Aneurysmal + T/B   Additional Objective Labs: Basic Metabolic Panel: Recent Labs  Lab 10/12/22 1720 10/13/22 0619  NA 136 137  K 6.5* 4.1  CL 94* 93*  CO2 15* 16*  GLUCOSE 209* 225*  BUN 121* 70*  CREATININE 9.10* 5.57*  CALCIUM 8.2* 8.4*  PHOS  --  6.3*   Liver Function Tests: Recent Labs  Lab 10/12/22 1720 10/13/22 0619  AST 12*  --   ALT 9  --   ALKPHOS 36*  --   BILITOT 0.8  --   PROT 7.0  --   ALBUMIN 2.7* 2.9*   Recent Labs  Lab 10/12/22 1720  LIPASE 45   CBC: Recent Labs  Lab 10/12/22 1720 10/13/22 0619  WBC 10.1 16.6*  NEUTROABS 6.5  --   HGB 6.1* 7.2*  HCT 19.7* 21.4*  MCV 100.0 92.6  PLT 438* 364   Blood Culture    Component Value Date/Time   SDES  06/25/2022 1638    BLOOD RIGHT HAND BOTTLES DRAWN AEROBIC AND ANAEROBIC Blood Culture results may not be optimal due to an excessive volume of blood received in culture bottles   SPECREQUEST NONE 06/25/2022 1638   CULT  06/25/2022 1638    NO GROWTH 5 DAYS Performed at [facility], [place] [address] [place], [place] [address]    REPTSTATUS 06/30/2022 FINAL 06/25/2022 1638    Cardiac Enzymes: No results for input(s): "CKTOTAL", "CKMB", "CKMBINDEX", "TROPONINI" in the last 168 hours. CBG: Recent Labs  Lab 10/12/22 2211  10/13/22 0751  GLUCAP 124* 217*   Iron Studies: No results for input(s): "IRON", "TIBC", "TRANSFERRIN", "FERRITIN" in the last 72 hours. '@lablastinr3'$ @ Studies/Results: DG Chest Portable 1 View  Result Date: 10/12/2022 CLINICAL DATA:  Chest pain and shortness of breath. EXAM: PORTABLE CHEST 1 VIEW COMPARISON:  10/03/2022 FINDINGS: Heart size is upper limits of normal but similar to the previous examination. No overt pulmonary edema. No focal airspace disease. Atherosclerotic calcifications at the aortic arch. Trachea is midline. Negative for a pneumothorax. IMPRESSION: 1. No acute cardiopulmonary disease. 2. Aortic atherosclerosis. Electronically Signed   By: Markus Daft M.D.   On: 10/12/2022 17:35   Medications:  lactated ringers     pantoprazole     pantoprazole      sodium chloride   Intravenous Once   acetaminophen  1,000 mg Oral Q6H   Or   acetaminophen  650 mg Rectal Q6H   atorvastatin  80 mg Oral Daily   bictegravir-emtricitabine-tenofovir AF  1 tablet Oral Daily   calcitRIOL  1.25 mcg Oral Q T,Th,Sa-HD   calcium acetate  2,001 mg Oral TID WC   Chlorhexidine Gluconate  Cloth  6 each Topical Q0600   gabapentin  100 mg Oral QHS   insulin glargine-yfgn  15 Units Subcutaneous QHS   multivitamin  1 tablet Oral Once per day on Tue Wed Thu   [START ON 10/16/2022] pantoprazole  40 mg Intravenous Q12H   senna-docusate  1 tablet Oral QHS   sodium zirconium cyclosilicate  5 g Oral Daily     Dialysis Orders: TTS [facility] 4h  400/1.5    78kg  2K/ 3Ca bath  LUA AVF   - Hep none - last HD 3/2, post wt 80.3kg - rocaltrol 1.0 mcg po tiw - venofer '100mg'$  tiw thru 3/14 3/10 doses given at [facility].  - mircera 200 mcg q 2, last 2/15, does not appear she received it on 2/29   Home meds include - albuterol, amlodipine/ olmesartan, biktarvy, rocaltrol, norvasc 10, lipitor, phoslo 2 ac tid, aspirin, prns/ vits/ supps    Assessment/Plan: SOB - improving. Not to EDW yet. Continue UF as tolerated.    Melena/Symptomatic Anemia: HGB 6.1 on admission. S/P 2 units PRBCs. GI consulted. H/O NSAID use. Per primary.    AMS-She has not missed HD. BUN elevated on admission, improved post HD. Concerned elevation in BUN/Low HGB is reflective of GIB. Discussed with primary. Do not see sedating meds or excessive use of gabapentin on med list. Per primary.  ESRD - on HD TTS. Next HD 10/15/2022. Did dislodge needles during HD last PM but able to complete tx.  HTN/ volume - Hypertensive on admission, not BP on soft side Net UF 1.7. On Amlodipine-olmesartan as OP. Currently on hold. Volume seem OK. Last wt if accurate at OP EDW 78 kg. UF as toleated.  Anemia w/ h/o heavy recent menses; now possible GIB. Please see # 2.Give ESA with next HD. Continue Fe load. Marland Kitchen MBD ckd - CCa in range; check PO4 for binder management. Cont po vdra and phoslo as binder for now. HIV  - per primary team. DM2 Recent PNA/Rib pain  Migraines - present since MI; been taking BC powder.  Tauri Ethington H. Climmie Cronce NP-C 10/13/2022, 10:54 AM  [employer] [phone]

## 2022-10-13 NOTE — Plan of Care (Signed)

## 2022-10-13 NOTE — Progress Notes (Signed)
IVT consult placed for PIV placement. [place] used to assess right upper arm. Was able to get blood return however return stopped after catheter was advanced. Attempted 1 additional time without success. MD arrived to bedside; IV RN requested central access due to limited peripheral vasculature and lab needs. MD to place order.   Geoffery Aultman Lorita Officer, RN

## 2022-10-14 ENCOUNTER — Inpatient Hospital Stay (HOSPITAL_COMMUNITY): Payer: 59 | Admitting: Certified Registered Nurse Anesthetist

## 2022-10-14 ENCOUNTER — Encounter (HOSPITAL_COMMUNITY)
Admission: EM | Disposition: A | Discharge status: Home / Self Care | Payer: Self-pay | Source: Home / Self Care | Attending: Internal Medicine

## 2022-10-14 ENCOUNTER — Encounter (HOSPITAL_COMMUNITY): Payer: Self-pay | Admitting: Internal Medicine

## 2022-10-14 DIAGNOSIS — K254 Chronic or unspecified gastric ulcer with hemorrhage: Secondary | ICD-10-CM

## 2022-10-14 DIAGNOSIS — K259 Gastric ulcer, unspecified as acute or chronic, without hemorrhage or perforation: Secondary | ICD-10-CM

## 2022-10-14 DIAGNOSIS — D62 Acute posthemorrhagic anemia: Secondary | ICD-10-CM | POA: Diagnosis present

## 2022-10-14 DIAGNOSIS — K2100 Gastro-esophageal reflux disease with esophagitis, without bleeding: Secondary | ICD-10-CM

## 2022-10-14 DIAGNOSIS — E8729 Other acidosis: Secondary | ICD-10-CM | POA: Diagnosis not present

## 2022-10-14 DIAGNOSIS — R410 Disorientation, unspecified: Secondary | ICD-10-CM

## 2022-10-14 DIAGNOSIS — J449 Chronic obstructive pulmonary disease, unspecified: Secondary | ICD-10-CM

## 2022-10-14 DIAGNOSIS — D500 Iron deficiency anemia secondary to blood loss (chronic): Secondary | ICD-10-CM | POA: Diagnosis not present

## 2022-10-14 DIAGNOSIS — Z87891 Personal history of nicotine dependence: Secondary | ICD-10-CM | POA: Diagnosis not present

## 2022-10-14 DIAGNOSIS — K250 Acute gastric ulcer with hemorrhage: Secondary | ICD-10-CM | POA: Diagnosis present

## 2022-10-14 DIAGNOSIS — G9340 Encephalopathy, unspecified: Secondary | ICD-10-CM

## 2022-10-14 DIAGNOSIS — N186 End stage renal disease: Secondary | ICD-10-CM | POA: Diagnosis not present

## 2022-10-14 DIAGNOSIS — K922 Gastrointestinal hemorrhage, unspecified: Secondary | ICD-10-CM | POA: Diagnosis not present

## 2022-10-14 DIAGNOSIS — K921 Melena: Secondary | ICD-10-CM

## 2022-10-14 HISTORY — DX: Acute posthemorrhagic anemia: D62

## 2022-10-14 HISTORY — PX: ESOPHAGOGASTRODUODENOSCOPY (EGD) WITH PROPOFOL: SHX5813

## 2022-10-14 HISTORY — PX: BIOPSY: SHX5522

## 2022-10-14 HISTORY — PX: HEMOSTASIS CLIP PLACEMENT: SHX6857

## 2022-10-14 LAB — RENAL FUNCTION PANEL
Albumin: 3 g/dL — ABNORMAL LOW (ref 3.5–5.0)
Anion gap: 20 — ABNORMAL HIGH (ref 5–15)
BUN: 94 mg/dL — ABNORMAL HIGH (ref 6–20)
CO2: 20 mmol/L — ABNORMAL LOW (ref 22–32)
Calcium: 8 mg/dL — ABNORMAL LOW (ref 8.9–10.3)
Chloride: 96 mmol/L — ABNORMAL LOW (ref 98–111)
Creatinine, Ser: 8.02 mg/dL — ABNORMAL HIGH (ref 0.44–1.00)
GFR, Estimated: 6 mL/min — ABNORMAL LOW (ref >60)
Glucose, Bld: 237 mg/dL — ABNORMAL HIGH (ref 70–99)
Phosphorus: 6.9 mg/dL — ABNORMAL HIGH (ref 2.5–4.6)
Potassium: 4.1 mmol/L (ref 3.5–5.1)
Sodium: 136 mmol/L (ref 135–145)

## 2022-10-14 LAB — GLUCOSE, CAPILLARY
Glucose-Capillary: 196 mg/dL — ABNORMAL HIGH (ref 70–99)
Glucose-Capillary: 178 mg/dL — ABNORMAL HIGH (ref 70–99)
Glucose-Capillary: 170 mg/dL — ABNORMAL HIGH (ref 70–99)
Glucose-Capillary: 188 mg/dL — ABNORMAL HIGH (ref 70–99)
Glucose-Capillary: 288 mg/dL — ABNORMAL HIGH (ref 70–99)
Glucose-Capillary: 218 mg/dL — ABNORMAL HIGH (ref 70–99)
Glucose-Capillary: 263 mg/dL — ABNORMAL HIGH (ref 70–99)

## 2022-10-14 LAB — HEPATIC FUNCTION PANEL
ALT: 75 U/L — ABNORMAL HIGH (ref 0–44)
AST: 92 U/L — ABNORMAL HIGH (ref 15–41)
Albumin: 2.9 g/dL — ABNORMAL LOW (ref 3.5–5.0)
Alkaline Phosphatase: 58 U/L (ref 38–126)
Bilirubin, Direct: <0.1 mg/dL (ref 0.0–0.2)
Total Bilirubin: <0.1 mg/dL — ABNORMAL LOW (ref 0.3–1.2)
Total Protein: 7.2 g/dL (ref 6.5–8.1)

## 2022-10-14 LAB — HEMOGLOBIN AND HEMATOCRIT, BLOOD
HCT: 20.5 % — ABNORMAL LOW (ref 36.0–46.0)
HCT: 21.9 % — ABNORMAL LOW (ref 36.0–46.0)
HCT: 24.1 % — ABNORMAL LOW (ref 36.0–46.0)
HCT: 24.2 % — ABNORMAL LOW (ref 36.0–46.0)
Hemoglobin: 7.1 g/dL — ABNORMAL LOW (ref 12.0–15.0)
Hemoglobin: 7.1 g/dL — ABNORMAL LOW (ref 12.0–15.0)
Hemoglobin: 7.7 g/dL — ABNORMAL LOW (ref 12.0–15.0)
Hemoglobin: 7.8 g/dL — ABNORMAL LOW (ref 12.0–15.0)

## 2022-10-14 LAB — PREPARE RBC (CROSSMATCH)

## 2022-10-14 LAB — HEPATITIS B SURFACE ANTIBODY, QUANTITATIVE: Hep B S AB Quant (Post): 103.4 m[IU]/mL (ref >9.9)

## 2022-10-14 SURGERY — ESOPHAGOGASTRODUODENOSCOPY (EGD) WITH PROPOFOL
Anesthesia: Monitor Anesthesia Care

## 2022-10-14 MED ORDER — ACETAMINOPHEN 10 MG/ML IV SOLN
1000.0000 mg | Freq: Once | INTRAVENOUS | Status: AC | PRN
  Administered 2022-10-14: 1000 mg via INTRAVENOUS
  Filled 2022-10-14 (×2): qty 100

## 2022-10-14 MED ORDER — HYDROMORPHONE HCL 1 MG/ML IJ SOLN
0.5000 mg | Freq: Once | INTRAMUSCULAR | Status: AC | PRN
  Administered 2022-10-15: 0.5 mg via INTRAVENOUS
  Filled 2022-10-14: qty 0.5

## 2022-10-14 MED ORDER — DICLOFENAC SODIUM 1 % EX GEL
4.0000 g | Freq: Four times a day (QID) | CUTANEOUS | Status: DC
  Administered 2022-10-14 – 2022-10-16 (×2): 4 g via TOPICAL
  Filled 2022-10-14: qty 100

## 2022-10-14 MED ORDER — PHENYLEPHRINE 80 MCG/ML (10ML) SYRINGE FOR IV PUSH (FOR BLOOD PRESSURE SUPPORT)
PREFILLED_SYRINGE | INTRAVENOUS | Status: DC | PRN
  Administered 2022-10-14: 160 ug via INTRAVENOUS
  Administered 2022-10-14 (×2): 80 ug via INTRAVENOUS
  Administered 2022-10-14: 160 ug via INTRAVENOUS

## 2022-10-14 MED ORDER — SODIUM CHLORIDE 0.9% IV SOLUTION
Freq: Once | INTRAVENOUS | Status: AC
  Administered 2022-10-14: 250 mL via INTRAVENOUS

## 2022-10-14 MED ORDER — LIDOCAINE 5 % EX PTCH
1.0000 | MEDICATED_PATCH | CUTANEOUS | Status: DC
  Administered 2022-10-14 – 2022-10-16 (×3): 1 via TRANSDERMAL
  Filled 2022-10-14 (×3): qty 1

## 2022-10-14 MED ORDER — PROPOFOL 500 MG/50ML IV EMUL
INTRAVENOUS | Status: DC | PRN
  Administered 2022-10-14: 75 ug/kg/min via INTRAVENOUS

## 2022-10-14 SURGICAL SUPPLY — 15 items

## 2022-10-14 NOTE — Transfer of Care (Signed)
Immediate Anesthesia Transfer of Care Note  Patient: Kathy Frank  Procedure(s) Performed: ESOPHAGOGASTRODUODENOSCOPY (EGD) WITH PROPOFOL HEMOSTASIS CLIP PLACEMENT BIOPSY  Patient Location: PACU  Anesthesia Type:MAC  Level of Consciousness: patient cooperative and responds to stimulation  Airway & Oxygen Therapy: Patient Spontanous Breathing and Patient connected to nasal cannula oxygen  Post-op Assessment: Report given to RN and Post -op Vital signs reviewed and stable  Post vital signs: Reviewed and stable  Last Vitals:  Vitals Value Taken Time  BP 99/43 10/14/22 1151  Temp 36.6 C 10/14/22 1151  Pulse 84 10/14/22 1154  Resp 15 10/14/22 1154  SpO2 100 % 10/14/22 1154  Vitals shown include unvalidated device data.  Last Pain:  Vitals:   10/14/22 1054  TempSrc: Temporal  PainSc:          Complications: No notable events documented.

## 2022-10-14 NOTE — Inpatient Diabetes Management (Signed)
Inpatient Diabetes Program Recommendations  AACE/ADA: New Consensus Statement on Inpatient Glycemic Control (2015)  Target Ranges:  Prepandial:   less than 140 mg/dL      Peak postprandial:   less than 180 mg/dL (1-2 hours)      Critically ill patients:  140 - 180 mg/dL    Latest Reference Range & Units 10/13/22 07:51 10/13/22 11:37 10/13/22 16:28  Glucose-Capillary 70 - 99 mg/dL 217 (H) 180 (H) 191 (H)  Pt Refused Semglee Insulin at bedtime  (H): Data is abnormally high  Latest Reference Range & Units 10/14/22 07:33 10/14/22 10:37  Glucose-Capillary 70 - 99 mg/dL 196 (H) 178 (H)  (H): Data is abnormally high    Admit with: SOB/ Hyperkalemia/ Melena  History: DM, ESRD, HIV  Home DM Meds: Lantus 15 units QHS        Victoza 0.6 mg Daily        Dexcom G7 CGM  Current Orders: Semglee 15 units QHS    NPO for EGD today  Note pt refused Semglee Insulin Last PM   MD- Please consider ordering Novolog Sensitive Correction Scale/ SSI (0-9 units) TID AC + HS     --Will follow patient during hospitalization--  Wyn Quaker RN, MSN, [place] [facility] Coordinator Inpatient Glycemic Control Team Team Pager: [phone] (8a-5p)

## 2022-10-14 NOTE — Progress Notes (Signed)
Blood admin finish time: 10:52am No suspected reaction  BP: 139/69 HR: 87 Spo2: 100% RA Temp: 97.29F  RR: 12

## 2022-10-14 NOTE — Op Note (Signed)
[facility] Patient Name: Kathy Frank Procedure Date : 10/14/2022 MRN: [id number] Attending MD: Carlota Raspberry. Havery Moros , MD, [id number] Date of Birth: May 23, 1976 CSN: [id number] Age: 47 Admit Type: Outpatient Procedure:                Upper GI endoscopy Indications:              Suspected upper gastrointestinal bleeding - melena,                            history of using Goody powders recently Providers:                Remo Lipps P. Havery Moros, MD, Glori Bickers, RN, Gloris Ham, Technician Referring MD:              Medicines:                Monitored Anesthesia Care Complications:            No immediate complications. Estimated blood loss:                            Minimal. Estimated Blood Loss:     Estimated blood loss was minimal. Procedure:                Pre-Anesthesia Assessment:                           - Prior to the procedure, a History and Physical                            was performed, and patient medications and                            allergies were reviewed. The patient's tolerance of                            previous anesthesia was also reviewed. The risks                            and benefits of the procedure and the sedation                            options and risks were discussed with the patient.                            All questions were answered, and informed consent                            was obtained. Prior Anticoagulants: The patient has                            taken no anticoagulant or antiplatelet agents. ASA  Grade Assessment: III - A patient with severe                            systemic disease. After reviewing the risks and                            benefits, the patient was deemed in satisfactory                            condition to undergo the procedure.                           After obtaining informed consent, the endoscope was                             passed under direct vision. Throughout the                            procedure, the patient's blood pressure, pulse, and                            oxygen saturations were monitored continuously. The                            [id number] Olympus endoscope was introduced                            through the mouth, and advanced to the second part                            of duodenum. The upper GI endoscopy was                            accomplished without difficulty. The patient                            tolerated the procedure well. Scope In: Scope Out: Findings:      Esophagogastric landmarks were identified: the Z-line was found at 40       cm, the gastroesophageal junction was found at 40 cm and the site of       hiatal narrowing was found at 40 cm from the incisors.      LA Grade B esophagitis was found in the distal esophagus.      The exam of the esophagus was otherwise normal.      Two non-bleeding cratered gastric ulcers with a clean ulcer base       (Forrest Class III) were found in the gastric body. The largest lesion       was 3-4 mm in largest dimension.      One non-bleeding cratered gastric ulcer with an adherent clot (Forrest       Class IIb) was found in the gastric body. The lesion was roughly 10-12       mm in largest dimension. I tried to suction / lavage the clot off and it       was adherent, could  not see the base of the ulcer. No active bleeding or       fresh heme in the stomach, she had stopped bleeding with PPI alone. To       prevent rebleeding, I placed hemostasis clips. First two clips applied       well to the one lateral side of the lesion to approximate the edges and       close one lateral side of the ulcer. The border of the ulcer was a bit       fibrotic and hard to approximate the edges in the center of the ulcer.       The other lateral side of it was closed using another 3 clips. I then       tried to approximate the edges of the  ulcer in the middle, using 3       additional clips. One misfired and did not adhere at all. The other two       could not approximate the edges due to fibrotic nature of it (largest       clips we had were applied), and only adhered to the side of it. Eight       hemostatic clips total used - one misfired, 5 in good position, 2       adhered to side of ulcer bed only. Center of the ulcer was not able to       be closed. Further attempts at clips placement I thought would be futile       at this point. There was no bleeding during the procedure.      The exam of the stomach was otherwise normal.      Biopsies were taken with a cold forceps for Helicobacter pylori testing.      The examined duodenum was normal. Impression:               - Esophagogastric landmarks identified.                           - LA Grade B reflux esophagitis.                           - Normal esophagus otherwise                           - Non-bleeding gastric ulcers with a clean ulcer                            base (Forrest Class III).                           - Non-bleeding gastric ulcer with an adherent clot                            (Forrest Class IIb). Clips were placed as above -                            this is the source of her recent bleeding symptoms                           - Normal examined duodenum.                           -  Biopsies were taken with a cold forceps for                            Helicobacter pylori testing. Recommendation:           - Return patient to hospital ward for ongoing care.                           - Clear liquid diet okay today                           - Continue present medications.                           - Continue IV protonix drip for now                           - Await pathology results.                           - Avoid all NSAIDs                           - We will reassess the patient tomorrow. Trend Hgb,                            monitor for  recurrent bleeding Procedure Code(s):        --- Professional ---                           [id number], 43, Esophagogastroduodenoscopy, flexible,                            transoral; with control of bleeding, any method                           43239, Esophagogastroduodenoscopy, flexible,                            transoral; with biopsy, single or multiple Diagnosis Code(s):        --- Professional ---                           K21.00, Gastro-esophageal reflux disease with                            esophagitis, without bleeding                           K25.9, Gastric ulcer, unspecified as acute or                            chronic, without hemorrhage or perforation                           K25.4, Chronic or unspecified gastric ulcer with  hemorrhage CPT copyright 2022 [employer]. All rights reserved. The codes documented in this report are preliminary and upon coder review may  be revised to meet current compliance requirements. Remo Lipps P. Aime Meloche, MD 10/14/2022 12:00:22 PM This report has been signed electronically. Number of Addenda: 0

## 2022-10-14 NOTE — Anesthesia Preprocedure Evaluation (Addendum)
Anesthesia Evaluation  Patient identified by MRN, date of birth, ID band Patient awake    Reviewed: Allergy & Precautions, NPO status , Patient's Chart, lab work & pertinent test results  History of Anesthesia Complications Negative for: history of anesthetic complications  Airway Mallampati: II  TM Distance: >3 FB Neck ROM: Full    Dental  (+) Poor Dentition, Missing, Dental Advisory Given   Pulmonary COPD,  COPD inhaler, former smoker   breath sounds clear to auscultation       Cardiovascular hypertension, Pt. on medications (-) angina + CAD, + Cardiac Stents and + Peripheral Vascular Disease   Rhythm:Regular Rate:Normal  '22 cath: Severe single-vessel CAD with 70% mid LAD stenosis and moderate 30 to 40% proximal and distal RCA stenosis.  Successful DES PCI of mid LAD '22 ECHO: EF 60-65%. The LV has normal function, no regional wall motion abnormalities. There is moderate LVH, trivial MR, aortic valve sclerosis without stenosis     Neuro/Psych   Anxiety Depression    CVA    GI/Hepatic PUD,GERD  Medicated and Controlled,,Elevated LFTs   Endo/Other  diabetes (glu 196), Insulin Dependent    Renal/GU ESRF and DialysisRenal disease (K+ 4.1)     Musculoskeletal   Abdominal   Peds  Hematology  (+) HIV  Anesthesia Other Findings   Reproductive/Obstetrics                             Anesthesia Physical Anesthesia Plan  ASA: 3  Anesthesia Plan: MAC   Post-op Pain Management: Minimal or no pain anticipated   Induction:   PONV Risk Score and Plan: 2 and Treatment may vary due to age or medical condition  Airway Management Planned: Natural Airway and Nasal Cannula  Additional Equipment: None  Intra-op Plan:   Post-operative Plan:   Informed Consent: I have reviewed the patients History and Physical, chart, labs and discussed the procedure including the risks, benefits and  alternatives for the proposed anesthesia with the patient or authorized representative who has indicated his/her understanding and acceptance.     Dental advisory given  Plan Discussed with: CRNA and Surgeon  Anesthesia Plan Comments:         Anesthesia Quick Evaluation

## 2022-10-14 NOTE — Progress Notes (Addendum)
HD#1 Subjective:   Summary: Kathy Frank is a 47 y.o. 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   Overnight Events: None  This morning the patient is significantly improved from yesterday both subjectively and objectively.  She is only having some pain in her right BKA stump and thinks she hit it sometime yesterday.  She is not having any bleeding or significant swelling but is having pain distal to her knee.  Further clarification of her symptoms.  She has been having dyspnea for about the past week and has been taking about 2 BC powder packets a day for the past 2 weeks for headaches and back pain.  She denies any melena prior to admission.  Objective:  Vital signs in last 24 hours: Vitals:   10/14/22 0831 10/14/22 0848 10/14/22 1035 10/14/22 1054  BP: 126/75 (!) 140/67 (!) 142/70 139/69  Pulse: 93 94 88 87  Resp: '18 19 15 12  '$ Temp: 98.6 F (37 C) 98.1 F (36.7 C) 97.7 F (36.5 C) (!) 97.2 F (36.2 C)  TempSrc: Oral Oral Temporal Temporal  SpO2: 98% 100% 100%   Weight:   78 kg   Height:       Supplemental O2: Room Air SpO2: 100 % O2 Flow Rate (L/min): 2 L/min   Physical Exam:  Constitutional: Comfortable appearing middle-aged female laying in bed Pulmonary/Chest: No increased work of breathing on room air MSK: Stable right BKA and left AKA.  Right BKA stump with well-healed scar, no signs of wound dehiscence, no knee effusion.  Tenderness to palpation primarily over the remaining tibia on the right. Neurological: Alert and oriented x 3 without focal deficits Skin: warm and dry, no coolness or pallor noted  Filed Weights   10/12/22 1653 10/14/22 1035  Weight: 78 kg 78 kg      Intake/Output Summary (Last 24 hours) at 10/14/2022 1122 Last data filed at  10/13/2022 1852 Gross per 24 hour  Intake 632.71 ml  Output --  Net 632.71 ml   Net IO Since Admission: -708.89 mL [10/14/22 1122]  Pertinent Labs:    Latest Ref Rng & Units 10/14/2022   12:22 AM 10/13/2022   11:51 PM 10/13/2022    8:24 PM  CBC  Hemoglobin 12.0 - 15.0 g/dL 7.1  7.1  7.4   Hematocrit 36.0 - 46.0 % 21.9  20.5  21.1        Latest Ref Rng & Units 10/14/2022    6:00 AM 10/14/2022    5:11 AM 10/13/2022   10:48 AM  CMP  Glucose 70 - 99 mg/dL 237   197   BUN 6 - 20 mg/dL 94   79   Creatinine 0.44 - 1.00 mg/dL 8.02   6.10   Sodium 135 - 145 mmol/L 136   138   Potassium 3.5 - 5.1 mmol/L 4.1   3.9   Chloride 98 - 111 mmol/L 96   93   CO2 22 - 32 mmol/L 20   22   Calcium 8.9 - 10.3 mg/dL 8.0   8.4   Total Protein 6.5 - 8.1 g/dL  7.2  7.0   Total Bilirubin 0.3 - 1.2 mg/dL  <0.1  0.6   Alkaline Phos 38 - 126 U/L  58  71   AST 15 -  41 U/L  92  109   ALT 0 - 44 U/L  75  83     Assessment/Plan:   Active Problems:   Anemia   Hyperkalemia   High anion gap metabolic acidosis   Metabolic encephalopathy   Upper GI bleed   Anemia, posthemorrhagic, acute   Patient Summary: Kathy Frank is a 47 y.o. 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  Post transfusion hemoglobin stable at 7.1, last checked at 12:30 AM.  She has not had any more bowel movements since midday yesterday.  GI is on board and plan for EGD today with preprocedure transfusion of 1 unit.  Now that the patient is more alert we have better information about heavy use of aspirin over the last 2 weeks but no melena prior to admission.  Advised patient that she  should not take high dose aspirin anymore. - EGD today with 1 unit PRBCs prior - Continue to monitor H&H every 6 hours - Transfuse for hemoglobin less than 7  Uremic encephalopathy Anion gap metabolic acidosis Hyperkalemia, resolved ESRD on HD TTS Mental status has drastically improved overnight and patient appears at baseline.  Her BUN this morning is 94, bicarb stable at 20 and gap is stable at 20.  Potassium 4.1 with phosphorus of 6.9.  Plan is for HD today after EGD. - HD per nephrology - Daily renal function panel - Continue home [place]elma  Pain over right BKA stump Patient has developed pain on her right BKA stump after she thinks she may have hit it yesterday.  She states that she does walk on her BKA stump without prosthesis.  On exam there is tenderness to palpation of the remaining tibia without swelling, erythema, or surgical wound dehiscence.  Most likely soft tissue injury but we will continue to monitor. - Lidocaine patch  Headache with history of migraines Patient has headaches at home frequently and has been taking BC powder for these at home.  Yesterday she was having a significant headache that appears to have responded to headache cocktail of diphenhydramine and prochlorperazine. - Tylenol 1000 mg every 6 as needed - May repeat diphenhydramine IV 25 mg and prochlorperazine IV 10 mg if she is having another headache that is not responding to Tylenol  Type 2 diabetes -Continue home Lantus 15 units daily and gabapentin 100 mg daily  Hypertension Hyperlipidemia We will continue to hold her home amlodipine/olmesartan in the setting of low blood pressures but will continue her home atorvastatin 80 mg daily.  HIV Continue home [place].  Diet: NPO IVF: None, VTE: None Code: Full   Dispo: Pending further evaluation of suspected upper GI bleed with endoscopy today.  Johny Blamer, DO Internal Medicine Resident PGY-1 Pager: [phone]  Please contact the on call  pager after 5 pm and on weekends at [phone].

## 2022-10-14 NOTE — Progress Notes (Addendum)
[facility] Progress Note   Subjective: AMS resolved, patient back to baseline. She has no complaints. We talked extensively about future avoidance of NSAIDS. She says she has felt "out of it" post HD. Suspect she has gained wt as she rarely meets OP EDW and leaves above EDW. Can adjust EDW.  HGB 7.2 getting 3rd unit of PRBCs. Endo later today.     Objective Vitals:   10/14/22 0831 10/14/22 0848 10/14/22 1035 10/14/22 1054  BP: 126/75 (!) 140/67 (!) 142/70 139/69  Pulse: 93 94 88 87  Resp: '18 19 15 12  '$ Temp: 98.6 F (37 C) 98.1 F (36.7 C) 97.7 F (36.5 C) (!) 97.2 F (36.2 C)  TempSrc: Oral Oral Temporal Temporal  SpO2: 98% 100% 100%   Weight:   78 kg   Height:       Physical Exam General: Chronically ill appearing female in NAD Neuro: Non focal, oriented X 3 Heart: S1,S2 RRR SR/ST on monitor. No M/R/G Lungs: CTAB A/P Abdomen: Soft, NABS Extremities: R BKA, L AKA. No stump edema Dialysis Access: L AVF Aneurysmal + T/B IV Access: R Tunneled Centra line Drsg intact.  Additional Objective Labs: Basic Metabolic Panel: Recent Labs  Lab 10/13/22 0619 10/13/22 1048 10/14/22 0600  NA 137 138 136  K 4.1 3.9 4.1  CL 93* 93* 96*  CO2 16* 22 20*  GLUCOSE 225* 197* 237*  BUN 70* 79* 94*  CREATININE 5.57* 6.10* 8.02*  CALCIUM 8.4* 8.4* 8.0*  PHOS 6.3*  --  6.9*   Liver Function Tests: Recent Labs  Lab 10/12/22 1720 10/13/22 0619 10/13/22 1048 10/14/22 0511 10/14/22 0600  AST 12*  --  109* 92*  --   ALT 9  --  83* 75*  --   ALKPHOS 36*  --  71 58  --   BILITOT 0.8  --  0.6 <0.1*  --   PROT 7.0  --  7.0 7.2  --   ALBUMIN 2.7*   < > 2.9* 2.9* 3.0*   < > = values in this interval not displayed.   Recent Labs  Lab 10/12/22 1720  LIPASE 45   CBC: Recent Labs  Lab 10/12/22 1720 10/13/22 0619 10/13/22 1048 10/13/22 2024 10/13/22 2351 10/14/22 0022  WBC 10.1 16.6* 15.0*  --   --   --   NEUTROABS 6.5  --   --   --   --   --   HGB 6.1* 7.2*  6.7* 7.4* 7.1* 7.1*  HCT 19.7* 21.4* 20.3* 21.1* 20.5* 21.9*  MCV 100.0 92.6 91.9  --   --   --   PLT 438* 364 342  --   --   --    Blood Culture    Component Value Date/Time   SDES  06/25/2022 1638    BLOOD RIGHT HAND BOTTLES DRAWN AEROBIC AND ANAEROBIC Blood Culture results may not be optimal due to an excessive volume of blood received in culture bottles   SPECREQUEST NONE 06/25/2022 1638   CULT  06/25/2022 1638    NO GROWTH 5 DAYS Performed at [facility], [place] [address] [place], [place] [address]    REPTSTATUS 06/30/2022 FINAL 06/25/2022 1638    Cardiac Enzymes: No results for input(s): "CKTOTAL", "CKMB", "CKMBINDEX", "TROPONINI" in the last 168 hours. CBG: Recent Labs  Lab 10/13/22 0751 10/13/22 1137 10/13/22 1628 10/14/22 0733 10/14/22 1037  GLUCAP 217* 180* 191* 196* 178*   Iron Studies: No results for input(s): "IRON", "TIBC", "  TRANSFERRIN", "FERRITIN" in the last 72 hours. '@[email or web address]'$ @ Studies/Results: IR US Guide Vasc Access Right  Result Date: 10/13/2022 INDICATION: no IV access, need central access.  ESRD patient. EXAM: ULTRASOUND AND FLUOROSCOPIC GUIDED PLACEMENT OF TUNNELED CENTRAL VENOUS CATHETER MEDICATIONS: None. ANESTHESIA/SEDATION: Local anesthetic was administered. FLUOROSCOPY TIME:  Fluoroscopic dose; 5 mGy COMPLICATIONS: None immediate. PROCEDURE: Informed written consent was obtained from the patient and/or patient's representative after a discussion of the risks, benefits, and alternatives to treatment. Questions regarding the procedure were encouraged and answered. The RIGHT neck and chest were prepped with chlorhexidine in a sterile fashion, and a sterile drape was applied covering the operative field. Maximum barrier sterile technique with sterile gowns and gloves were used for the procedure. A timeout was performed prior to the initiation of the procedure. After the overlying soft tissues were anesthetized, a small venotomy incision was created  and a micropuncture kit was utilized to access the internal jugular vein. Real-time ultrasound guidance was utilized for vascular access including the acquisition of a permanent ultrasound image documenting patency of the accessed vessel. The microwire was utilized to measure appropriate catheter length. The micropuncture sheath was exchanged for a peel-away sheath over a guidewire. A 5 Fr dual lumen tunneled central venous catheter measuring 25 cm was tunneled in a retrograde fashion from the anterior chest wall to the venotomy incision. The catheter was then placed through the peel-away sheath with tip ultimately positioned at the superior caval-atrial junction. Final catheter positioning was confirmed and documented with a spot radiographic image. The catheter aspirates and flushes normally. The catheter was flushed with appropriate volume heparin dwells. The catheter exit site was secured with a 2-0 nylon retention suture. The venotomy incision was closed with Dermabond. Dressings were applied. The patient tolerated the procedure well without immediate post procedural complication. FINDINGS: After catheter placement, the tip lies within the superior apect of the right atrium. The catheter aspirates and flushes normally and is ready for immediate use. IMPRESSION: Successful placement of 25 cm dual lumen tunneled central venous "Powerline" catheter via the RIGHT internal jugular vein The tip of the catheter is positioned within the proximal RIGHT atrium. The catheter is ready for immediate use. Michaelle Birks, MD [facility] Radiology Electronically Signed   By: Michaelle Birks M.D.   On: 10/13/2022 13:16   IR Fluoro Guide CV Line Right  Result Date: 10/13/2022 INDICATION: no IV access, need central access.  ESRD patient. EXAM: ULTRASOUND AND FLUOROSCOPIC GUIDED PLACEMENT OF TUNNELED CENTRAL VENOUS CATHETER MEDICATIONS: None. ANESTHESIA/SEDATION: Local anesthetic was  administered. FLUOROSCOPY TIME:  Fluoroscopic dose; 5 mGy COMPLICATIONS: None immediate. PROCEDURE: Informed written consent was obtained from the patient and/or patient's representative after a discussion of the risks, benefits, and alternatives to treatment. Questions regarding the procedure were encouraged and answered. The RIGHT neck and chest were prepped with chlorhexidine in a sterile fashion, and a sterile drape was applied covering the operative field. Maximum barrier sterile technique with sterile gowns and gloves were used for the procedure. A timeout was performed prior to the initiation of the procedure. After the overlying soft tissues were anesthetized, a small venotomy incision was created and a micropuncture kit was utilized to access the internal jugular vein. Real-time ultrasound guidance was utilized for vascular access including the acquisition of a permanent ultrasound image documenting patency of the accessed vessel. The microwire was utilized to measure appropriate catheter length. The micropuncture sheath was exchanged for a peel-away sheath over a guidewire. A 5 Fr  dual lumen tunneled central venous catheter measuring 25 cm was tunneled in a retrograde fashion from the anterior chest wall to the venotomy incision. The catheter was then placed through the peel-away sheath with tip ultimately positioned at the superior caval-atrial junction. Final catheter positioning was confirmed and documented with a spot radiographic image. The catheter aspirates and flushes normally. The catheter was flushed with appropriate volume heparin dwells. The catheter exit site was secured with a 2-0 nylon retention suture. The venotomy incision was closed with Dermabond. Dressings were applied. The patient tolerated the procedure well without immediate post procedural complication. FINDINGS: After catheter placement, the tip lies within the superior apect of the right atrium. The catheter aspirates and flushes  normally and is ready for immediate use. IMPRESSION: Successful placement of 25 cm dual lumen tunneled central venous "Powerline" catheter via the RIGHT internal jugular vein The tip of the catheter is positioned within the proximal RIGHT atrium. The catheter is ready for immediate use. Michaelle Birks, MD [facility] Radiology Electronically Signed   By: Michaelle Birks M.D.   On: 10/13/2022 13:16   CT HEAD WO CONTRAST (5MM)  Result Date: 10/13/2022 CLINICAL DATA:  Altered mental status EXAM: CT HEAD WITHOUT CONTRAST TECHNIQUE: Contiguous axial images were obtained from the base of the skull through the vertex without intravenous contrast. RADIATION DOSE REDUCTION: This exam was performed according to the departmental dose-optimization program which includes automated exposure control, adjustment of the mA and/or kV according to patient size and/or use of iterative reconstruction technique. COMPARISON:  CT Brain 09/15/22 FINDINGS: Motion degraded exam Brain: No hemorrhage. No CT evidence of an acute cortical infarct. Redemonstrated asymmetric hypodensity in the periventricular white matter on right. No hydrocephalus. No CT evidence of an acute cortical infarct. Cavum septum pellucidum et vergae. Vascular: There are are diffuse atherosclerotic vascular calcifications in keeping with the patient's history of diabetes. Skull: Normal. Negative for fracture or focal lesion. Sinuses/Orbits: No mastoid or middle ear effusion. Mild mucosal thickening in the left sphenoid sinus with focal occlusion1 of the left sphenoethmoidal recess. Bilateral orbits are unremarkable. There are small punctate radiodensities in the subcutaneous soft tissues overlying the nasal bridge, favored to represent dermal calcifications and less likely foreign bodies. Other: None IMPRESSION: No hemorrhage or CT evidence of an acute cortical infarct. There may be a possible age indeterminate infarct in the  left thalamus. Electronically Signed   By: Marin Roberts M.D.   On: 10/13/2022 13:08   DG Chest Portable 1 View  Result Date: 10/12/2022 CLINICAL DATA:  Chest pain and shortness of breath. EXAM: PORTABLE CHEST 1 VIEW COMPARISON:  10/03/2022 FINDINGS: Heart size is upper limits of normal but similar to the previous examination. No overt pulmonary edema. No focal airspace disease. Atherosclerotic calcifications at the aortic arch. Trachea is midline. Negative for a pneumothorax. IMPRESSION: 1. No acute cardiopulmonary disease. 2. Aortic atherosclerosis. Electronically Signed   By: Markus Daft M.D.   On: 10/12/2022 17:35   Medications:  [MAR Hold] ferric gluconate (FERRLECIT) IVPB     [MAR Hold] lactated ringers     pantoprazole 8 mg/hr (10/14/22 1102)    [MAR Hold] sodium chloride   Intravenous Once   [MAR Hold] acetaminophen  1,000 mg Oral Q6H   Or   [MAR Hold] acetaminophen  650 mg Rectal Q6H   [MAR Hold] atorvastatin  80 mg Oral Daily   [MAR Hold] bictegravir-emtricitabine-tenofovir AF  1 tablet Oral Daily   [MAR Hold] calcitRIOL  1.25 mcg Oral Q T,Th,Sa-HD   [MAR Hold] calcium acetate  2,001 mg Oral TID WC   [MAR Hold] Chlorhexidine Gluconate Cloth  6 each Topical Q0600   [MAR Hold] darbepoetin (ARANESP) injection - DIALYSIS  200 mcg Subcutaneous Q Thu-1800   [MAR Hold] gabapentin  100 mg Oral QHS   [MAR Hold] insulin glargine-yfgn  15 Units Subcutaneous QHS   [MAR Hold] lidocaine  1 patch Transdermal Q24H   [MAR Hold] multivitamin  1 tablet Oral Once per day on Tue Wed Thu   [MAR Hold] pantoprazole  40 mg Intravenous Q12H   [MAR Hold] senna-docusate  1 tablet Oral QHS   [MAR Hold] sodium zirconium cyclosilicate  5 g Oral Daily     Dialysis Orders: TTS [facility] 4h  400/1.5    78kg  2K/ 3Ca bath  LUA AVF   - Hep none - last HD 3/2, post wt 80.3kg - rocaltrol 1.0 mcg po tiw - venofer '100mg'$  tiw thru 3/14 3/10 doses given at [facility].  - mircera 200 mcg q 2, last 2/15, does not appear  she received it on 2/29   Home meds include - albuterol, amlodipine/ olmesartan, biktarvy, rocaltrol, norvasc 10, lipitor, phoslo 2 ac tid, aspirin, prns/ vits/ supps    Assessment/Plan: SOB - improving. Not to EDW yet. Continue UF as tolerated.   Melena/Symptomatic Anemia: HGB 6.1 on admission, HGB 7.2 on 10/14/2022. S/P 3 units PRBCs. GI consulted. Edno later today. H/O NSAID use. Per primary.   AMS-She has not missed HD. BUN elevated on admission, improved post HD. Concerned elevation in BUN/Low HGB is reflective of GIB. Discussed with primary. Do not see sedating meds or excessive use of gabapentin on med list. Per primary. Now resolved. ESRD - on HD TTS. Next HD 10/15/2022. HTN/ volume - Hypertensive on admission, now BP on soft side Net UF 1.7. On Amlodipine-olmesartan as OP. Currently on hold. Volume seems OK. Last wt if accurate at OP EDW 78 kg. UF as tolerated, optimize volume with HD.  Anemia w/ h/o heavy recent menses; now possible GIB. Please see # 2.Give ESA with next HD. Continue Fe load.  MBD ckd - CCa in range; check PO4 for binder management. Cont po vdra and phoslo as binder for now. HIV  - per primary team. DM2 Recent PNA/Rib pain  Migraines - present since MI; been taking BC powder. Educated pt to NEVER use NSAIDS again!    Omari Mcmanaway H. Alayssa Flinchum NP-C 10/14/2022, 11:19 AM  [employer] [phone]

## 2022-10-14 NOTE — Progress Notes (Signed)
Progress Note   Subjective  Mental status significantly improved today- reportedly at baseline. She is very clear and answering questions appropriately. Had a few episodes of melena yesterday - unclear if she had any overnight, staff in room did not think so. 2 units PRBC yesterday, Hgb stayed stable around 7. She complains of R knee pain otherwise. Endorses goody powders in recent days.   Objective   Vital signs in last 24 hours: Temp:  [98.9 F (37.2 C)-99.3 F (37.4 C)] 98.9 F (37.2 C) (03/06 0305) Pulse Rate:  [80-105] 97 (03/06 0305) Resp:  [15-20] 16 (03/06 0305) BP: (90-165)/(46-97) 165/46 (03/06 0305) SpO2:  [95 %-100 %] 96 % (03/06 0305) Last BM Date : 10/13/22 General:    AA female in NAD Abdomen:  Soft, nontender and nondistended.  Neurologic:  Alert and oriented,  grossly normal neurologically. Psych:  Cooperative. Normal mood and affect.  Intake/Output from previous day: 03/05 0701 - 03/06 0700 In: 632.7 [I.V.:40.7; Blood:592] Out: -  Intake/Output this shift: No intake/output data recorded.  Lab Results: Recent Labs    10/12/22 1720 10/13/22 0619 10/13/22 1048 10/13/22 2024 10/13/22 2351 10/14/22 0022  WBC 10.1 16.6* 15.0*  --   --   --   HGB 6.1* 7.2* 6.7* 7.4* 7.1* 7.1*  HCT 19.7* 21.4* 20.3* 21.1* 20.5* 21.9*  PLT 438* 364 342  --   --   --    BMET Recent Labs    10/13/22 0619 10/13/22 1048 10/14/22 0600  NA 137 138 136  K 4.1 3.9 4.1  CL 93* 93* 96*  CO2 16* 22 20*  GLUCOSE 225* 197* 237*  BUN 70* 79* 94*  CREATININE 5.57* 6.10* 8.02*  CALCIUM 8.4* 8.4* 8.0*   LFT Recent Labs    10/13/22 1048 10/14/22 0600  PROT 7.0  --   ALBUMIN 2.9* 3.0*  AST 109*  --   ALT 83*  --   ALKPHOS 71  --   BILITOT 0.6  --    PT/INR Recent Labs    10/12/22 1834  LABPROT 15.6*  INR 1.3*    Studies/Results: IR US Guide Vasc Access Right  Result Date: 10/13/2022 INDICATION: no IV access, need central access.  ESRD patient. EXAM:  ULTRASOUND AND FLUOROSCOPIC GUIDED PLACEMENT OF TUNNELED CENTRAL VENOUS CATHETER MEDICATIONS: None. ANESTHESIA/SEDATION: Local anesthetic was administered. FLUOROSCOPY TIME:  Fluoroscopic dose; 5 mGy COMPLICATIONS: None immediate. PROCEDURE: Informed written consent was obtained from the patient and/or patient's representative after a discussion of the risks, benefits, and alternatives to treatment. Questions regarding the procedure were encouraged and answered. The RIGHT neck and chest were prepped with chlorhexidine in a sterile fashion, and a sterile drape was applied covering the operative field. Maximum barrier sterile technique with sterile gowns and gloves were used for the procedure. A timeout was performed prior to the initiation of the procedure. After the overlying soft tissues were anesthetized, a small venotomy incision was created and a micropuncture kit was utilized to access the internal jugular vein. Real-time ultrasound guidance was utilized for vascular access including the acquisition of a permanent ultrasound image documenting patency of the accessed vessel. The microwire was utilized to measure appropriate catheter length. The micropuncture sheath was exchanged for a peel-away sheath over a guidewire. A 5 Fr dual lumen tunneled central venous catheter measuring 25 cm was tunneled in a retrograde fashion from the anterior chest wall to the venotomy incision. The catheter was then placed through the peel-away sheath with tip  ultimately positioned at the superior caval-atrial junction. Final catheter positioning was confirmed and documented with a spot radiographic image. The catheter aspirates and flushes normally. The catheter was flushed with appropriate volume heparin dwells. The catheter exit site was secured with a 2-0 nylon retention suture. The venotomy incision was closed with Dermabond. Dressings were applied. The patient tolerated the procedure well without immediate post procedural  complication. FINDINGS: After catheter placement, the tip lies within the superior apect of the right atrium. The catheter aspirates and flushes normally and is ready for immediate use. IMPRESSION: Successful placement of 25 cm dual lumen tunneled central venous "Powerline" catheter via the RIGHT internal jugular vein The tip of the catheter is positioned within the proximal RIGHT atrium. The catheter is ready for immediate use. Michaelle Birks, MD [facility] Radiology Electronically Signed   By: Michaelle Birks M.D.   On: 10/13/2022 13:16   IR Fluoro Guide CV Line Right  Result Date: 10/13/2022 INDICATION: no IV access, need central access.  ESRD patient. EXAM: ULTRASOUND AND FLUOROSCOPIC GUIDED PLACEMENT OF TUNNELED CENTRAL VENOUS CATHETER MEDICATIONS: None. ANESTHESIA/SEDATION: Local anesthetic was administered. FLUOROSCOPY TIME:  Fluoroscopic dose; 5 mGy COMPLICATIONS: None immediate. PROCEDURE: Informed written consent was obtained from the patient and/or patient's representative after a discussion of the risks, benefits, and alternatives to treatment. Questions regarding the procedure were encouraged and answered. The RIGHT neck and chest were prepped with chlorhexidine in a sterile fashion, and a sterile drape was applied covering the operative field. Maximum barrier sterile technique with sterile gowns and gloves were used for the procedure. A timeout was performed prior to the initiation of the procedure. After the overlying soft tissues were anesthetized, a small venotomy incision was created and a micropuncture kit was utilized to access the internal jugular vein. Real-time ultrasound guidance was utilized for vascular access including the acquisition of a permanent ultrasound image documenting patency of the accessed vessel. The microwire was utilized to measure appropriate catheter length. The micropuncture sheath was exchanged for a peel-away sheath over a  guidewire. A 5 Fr dual lumen tunneled central venous catheter measuring 25 cm was tunneled in a retrograde fashion from the anterior chest wall to the venotomy incision. The catheter was then placed through the peel-away sheath with tip ultimately positioned at the superior caval-atrial junction. Final catheter positioning was confirmed and documented with a spot radiographic image. The catheter aspirates and flushes normally. The catheter was flushed with appropriate volume heparin dwells. The catheter exit site was secured with a 2-0 nylon retention suture. The venotomy incision was closed with Dermabond. Dressings were applied. The patient tolerated the procedure well without immediate post procedural complication. FINDINGS: After catheter placement, the tip lies within the superior apect of the right atrium. The catheter aspirates and flushes normally and is ready for immediate use. IMPRESSION: Successful placement of 25 cm dual lumen tunneled central venous "Powerline" catheter via the RIGHT internal jugular vein The tip of the catheter is positioned within the proximal RIGHT atrium. The catheter is ready for immediate use. Michaelle Birks, MD [facility] Radiology Electronically Signed   By: Michaelle Birks M.D.   On: 10/13/2022 13:16   CT HEAD WO CONTRAST (5MM)  Result Date: 10/13/2022 CLINICAL DATA:  Altered mental status EXAM: CT HEAD WITHOUT CONTRAST TECHNIQUE: Contiguous axial images were obtained from the base of the skull through the vertex without intravenous contrast. RADIATION DOSE REDUCTION: This exam was performed according to the departmental  dose-optimization program which includes automated exposure control, adjustment of the mA and/or kV according to patient size and/or use of iterative reconstruction technique. COMPARISON:  CT Brain 09/15/22 FINDINGS: Motion degraded exam Brain: No hemorrhage. No CT evidence of an acute cortical infarct.  Redemonstrated asymmetric hypodensity in the periventricular white matter on right. No hydrocephalus. No CT evidence of an acute cortical infarct. Cavum septum pellucidum et vergae. Vascular: There are are diffuse atherosclerotic vascular calcifications in keeping with the patient's history of diabetes. Skull: Normal. Negative for fracture or focal lesion. Sinuses/Orbits: No mastoid or middle ear effusion. Mild mucosal thickening in the left sphenoid sinus with focal occlusion1 of the left sphenoethmoidal recess. Bilateral orbits are unremarkable. There are small punctate radiodensities in the subcutaneous soft tissues overlying the nasal bridge, favored to represent dermal calcifications and less likely foreign bodies. Other: None IMPRESSION: No hemorrhage or CT evidence of an acute cortical infarct. There may be a possible age indeterminate infarct in the left thalamus. Electronically Signed   By: Marin Roberts M.D.   On: 10/13/2022 13:08   DG Chest Portable 1 View  Result Date: 10/12/2022 CLINICAL DATA:  Chest pain and shortness of breath. EXAM: PORTABLE CHEST 1 VIEW COMPARISON:  10/03/2022 FINDINGS: Heart size is upper limits of normal but similar to the previous examination. No overt pulmonary edema. No focal airspace disease. Atherosclerotic calcifications at the aortic arch. Trachea is midline. Negative for a pneumothorax. IMPRESSION: 1. No acute cardiopulmonary disease. 2. Aortic atherosclerosis. Electronically Signed   By: Markus Daft M.D.   On: 10/12/2022 17:35       Assessment / Plan:    47 y/o female admitted with the following:  Melena - likely upper GI bleed Anemia Delirium / encephalopathy ESRD on HD  Mental status significantly improved overnight, she is appropriate this AM and understands why she is here. She has been using goody powder recently, high risk for PUD which I suspect is the cause of her symptoms. Hgb in low 7s post 2 units PRBC, hasn't risen appropriately. I offered her  an EGD today to further evaluate and treat endoscopically if amenable. Discussed risks / benefits of EGD and anesthesia and she wishes to proceed. Tentatively have her scheduled for 11 AM today. NPO until then, she has not had anything to eat today. Melena appears to have slowed overnight. Will give another unit PRBC this AM given she hasn't responded appropriately to other units and not much buffer if she decreases further. She is agreeable to this. Otherwise continue IV protonix for now. Further recommendations pending EGD.   PLAN: - NPO for now - EGD tentatively at 1100 today - continue IV protonix - 1 unit PRBC transfusion this AM  Call with questions or change sin her status.  Jolly Mango, MD [facility]astroenterology

## 2022-10-14 NOTE — Anesthesia Postprocedure Evaluation (Signed)
Anesthesia Post Note  Patient: Kathy Frank  Procedure(s) Performed: ESOPHAGOGASTRODUODENOSCOPY (EGD) WITH PROPOFOL HEMOSTASIS CLIP PLACEMENT BIOPSY     Patient location during evaluation: Endoscopy Anesthesia Type: MAC Level of consciousness: sedated, patient cooperative and oriented Pain management: pain level controlled Vital Signs Assessment: post-procedure vital signs reviewed and stable Respiratory status: nonlabored ventilation, spontaneous breathing and respiratory function stable Cardiovascular status: blood pressure returned to baseline and stable Postop Assessment: no apparent nausea or vomiting Anesthetic complications: no   No notable events documented.  Last Vitals:  Vitals:   10/14/22 1054 10/14/22 1151  BP: 139/69 (!) 99/43  Pulse: 87 84  Resp: 12 15  Temp: (!) 36.2 C 36.6 C  SpO2:  100%    Last Pain:  Vitals:   10/14/22 1151  TempSrc:   PainSc: 0-No pain                 Kourtlynn Trevor,E. Gavynn Duvall

## 2022-10-15 DIAGNOSIS — K250 Acute gastric ulcer with hemorrhage: Secondary | ICD-10-CM | POA: Diagnosis not present

## 2022-10-15 DIAGNOSIS — D649 Anemia, unspecified: Secondary | ICD-10-CM | POA: Diagnosis not present

## 2022-10-15 LAB — BPAM RBC
Blood Product Expiration Date: 202403242359
Blood Product Expiration Date: 202403262359
Blood Product Expiration Date: 202403272359
ISSUE DATE / TIME: 202403042136
ISSUE DATE / TIME: 202403051350
ISSUE DATE / TIME: 202403060826
Unit Type and Rh: 7300
Unit Type and Rh: 7300
Unit Type and Rh: 7300

## 2022-10-15 LAB — TYPE AND SCREEN
ABO/RH(D): B POS
Antibody Screen: NEGATIVE
Unit division: 0
Unit division: 0
Unit division: 0

## 2022-10-15 LAB — HEMOGLOBIN AND HEMATOCRIT, BLOOD
HCT: 22.4 % — ABNORMAL LOW (ref 36.0–46.0)
HCT: 23.4 % — ABNORMAL LOW (ref 36.0–46.0)
Hemoglobin: 7.3 g/dL — ABNORMAL LOW (ref 12.0–15.0)
Hemoglobin: 7.6 g/dL — ABNORMAL LOW (ref 12.0–15.0)

## 2022-10-15 LAB — GLUCOSE, CAPILLARY
Glucose-Capillary: 100 mg/dL — ABNORMAL HIGH (ref 70–99)
Glucose-Capillary: 135 mg/dL — ABNORMAL HIGH (ref 70–99)
Glucose-Capillary: 149 mg/dL — ABNORMAL HIGH (ref 70–99)
Glucose-Capillary: 119 mg/dL — ABNORMAL HIGH (ref 70–99)

## 2022-10-15 LAB — RENAL FUNCTION PANEL
Albumin: 2.6 g/dL — ABNORMAL LOW (ref 3.5–5.0)
Anion gap: 20 — ABNORMAL HIGH (ref 5–15)
BUN: 108 mg/dL — ABNORMAL HIGH (ref 6–20)
CO2: 21 mmol/L — ABNORMAL LOW (ref 22–32)
Calcium: 7.8 mg/dL — ABNORMAL LOW (ref 8.9–10.3)
Chloride: 97 mmol/L — ABNORMAL LOW (ref 98–111)
Creatinine, Ser: 9.94 mg/dL — ABNORMAL HIGH (ref 0.44–1.00)
GFR, Estimated: 4 mL/min — ABNORMAL LOW (ref >60)
Glucose, Bld: 103 mg/dL — ABNORMAL HIGH (ref 70–99)
Phosphorus: 7.1 mg/dL — ABNORMAL HIGH (ref 2.5–4.6)
Potassium: 3.9 mmol/L (ref 3.5–5.1)
Sodium: 138 mmol/L (ref 135–145)

## 2022-10-15 LAB — SURGICAL PATHOLOGY

## 2022-10-15 MED ORDER — LIDOCAINE HCL (PF) 1 % IJ SOLN: 5.0000 mL | INTRAMUSCULAR | Status: DC | PRN

## 2022-10-15 MED ORDER — AMLODIPINE BESYLATE 10 MG PO TABS
10.0000 mg | ORAL_TABLET | Freq: Every day | ORAL | Status: DC
  Administered 2022-10-15 – 2022-10-16 (×2): 10 mg via ORAL
  Filled 2022-10-15 (×2): qty 1

## 2022-10-15 MED ORDER — HEPARIN SODIUM (PORCINE) 1000 UNIT/ML DIALYSIS: 1000.0000 [IU] | INTRAMUSCULAR | Status: DC | PRN

## 2022-10-15 MED ORDER — ONDANSETRON HCL 4 MG/2ML IJ SOLN
INTRAMUSCULAR | Status: AC
  Administered 2022-10-15: 4 mg
  Filled 2022-10-15: qty 2

## 2022-10-15 MED ORDER — HEPARIN SODIUM (PORCINE) 1000 UNIT/ML IJ SOLN
INTRAMUSCULAR | Status: AC
  Filled 2022-10-15: qty 4

## 2022-10-15 MED ORDER — INSULIN ASPART 100 UNIT/ML IJ SOLN
0.0000 [IU] | Freq: Three times a day (TID) | INTRAMUSCULAR | Status: DC
  Administered 2022-10-15 (×2): 1 [IU] via SUBCUTANEOUS
  Administered 2022-10-16 (×2): 2 [IU] via SUBCUTANEOUS

## 2022-10-15 MED ORDER — ALTEPLASE 2 MG IJ SOLR: 2.0000 mg | Freq: Once | INTRAMUSCULAR | Status: DC | PRN

## 2022-10-15 MED ORDER — LIDOCAINE-PRILOCAINE 2.5-2.5 % EX CREA: 1.0000 | TOPICAL_CREAM | CUTANEOUS | Status: DC | PRN

## 2022-10-15 MED ORDER — ONDANSETRON HCL 4 MG/2ML IJ SOLN: 4.0000 mg | Freq: Once | INTRAMUSCULAR | Status: DC

## 2022-10-15 MED ORDER — DIPHENHYDRAMINE HCL 50 MG/ML IJ SOLN
25.0000 mg | Freq: Three times a day (TID) | INTRAMUSCULAR | Status: AC | PRN
  Administered 2022-10-15: 25 mg via INTRAVENOUS
  Filled 2022-10-15: qty 1

## 2022-10-15 MED ORDER — PENTAFLUOROPROP-TETRAFLUOROETH EX AERO: 1.0000 | INHALATION_SPRAY | CUTANEOUS | Status: DC | PRN

## 2022-10-15 MED ORDER — ANTICOAGULANT SODIUM CITRATE 4% (200MG/5ML) IV SOLN: 5.0000 mL | Status: DC | PRN

## 2022-10-15 MED ORDER — ASPIRIN 81 MG PO TBEC
81.0000 mg | DELAYED_RELEASE_TABLET | Freq: Every day | ORAL | Status: DC
  Administered 2022-10-15 – 2022-10-16 (×2): 81 mg via ORAL
  Filled 2022-10-15 (×2): qty 1

## 2022-10-15 MED ORDER — HYDROMORPHONE HCL 1 MG/ML IJ SOLN
0.5000 mg | Freq: Once | INTRAMUSCULAR | Status: AC | PRN
  Administered 2022-10-16: 0.5 mg via INTRAVENOUS
  Filled 2022-10-15 (×2): qty 0.5

## 2022-10-15 MED ORDER — PROCHLORPERAZINE EDISYLATE 10 MG/2ML IJ SOLN
10.0000 mg | Freq: Three times a day (TID) | INTRAMUSCULAR | Status: AC | PRN
  Administered 2022-10-15: 10 mg via INTRAVENOUS
  Filled 2022-10-15: qty 2

## 2022-10-15 NOTE — Progress Notes (Signed)
Pathology results returned:  FINAL MICROSCOPIC DIAGNOSIS:   A. STOMACH, BIOPSY:  - Gastric antral mucosa with nonspecific reactive gastropathy  - Gastric oxyntic mucosa with no specific histopathologic changes  - Helicobacter pylori-like organisms are not identified on routine HE  stain    She tested negative for H pylori. Ulcers due to NSAID use which she needs to completely avoid.

## 2022-10-15 NOTE — Progress Notes (Addendum)
HD#2 Subjective:   Summary: Kathy Frank is a 47 y.o. 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   Overnight Events: None  Patient is still doing well this morning with some abdominal cramping and bloating feeling but no localized pain.  She has not had another stool since EGD.  She denies any constant abdominal pain, nausea, vomiting.  She is also not having any more pain on her right BKA.  Objective:  Vital signs in last 24 hours: Vitals:   10/15/22 1200 10/15/22 1230 10/15/22 1318 10/15/22 1407  BP: 110/64 106/65  135/73  Pulse: 79 80  85  Resp: 13 (!) 9  10  Temp:  (!) 97.4 F (36.3 C)  98.1 F (36.7 C)  TempSrc:  Oral  Oral  SpO2: 100% 100%  100%  Weight:   77.8 kg   Height:       Supplemental O2: Room Air SpO2: 100 % O2 Flow Rate (L/min): 2 L/min   Physical Exam:  Constitutional: Comfortable appearing middle-aged female laying in bed Pulmonary/Chest: No increased work of breathing on room air Abdomen: Normoactive bowel sounds, soft, nontender, nondistended MSK: Stable right BKA and left AKA. Neurological: Alert and oriented x 3 without focal deficits Skin: warm and dry  Filed Weights   10/14/22 1035 10/15/22 0841 10/15/22 1318  Weight: 78 kg 81 kg 77.8 kg      Intake/Output Summary (Last 24 hours) at 10/15/2022 1450 Last data filed at 10/14/2022 1455 Gross per 24 hour  Intake 240 ml  Output --  Net 240 ml   Net IO Since Admission: -153.89 mL [10/15/22 1450]  Pertinent Labs:    Latest Ref Rng & Units 10/15/2022    5:17 AM 10/15/2022   12:00 AM 10/14/2022    6:01 PM  CBC  Hemoglobin 12.0 - 15.0 g/dL 7.6  7.3  7.7   Hematocrit 36.0 - 46.0 % 23.4  22.4  24.1        Latest Ref Rng & Units 10/15/2022    5:17 AM 10/14/2022    6:00 AM 10/14/2022     5:11 AM  CMP  Glucose 70 - 99 mg/dL 103  237    BUN 6 - 20 mg/dL 108  94    Creatinine 0.44 - 1.00 mg/dL 9.94  8.02    Sodium 135 - 145 mmol/L 138  136    Potassium 3.5 - 5.1 mmol/L 3.9  4.1    Chloride 98 - 111 mmol/L 97  96    CO2 22 - 32 mmol/L 21  20    Calcium 8.9 - 10.3 mg/dL 7.8  8.0    Total Protein 6.5 - 8.1 g/dL   7.2   Total Bilirubin 0.3 - 1.2 mg/dL   <0.1   Alkaline Phos 38 - 126 U/L   58   AST 15 - 41 U/L   92   ALT 0 - 44 U/L   75     Assessment/Plan:   Principal Problem:   Upper GI bleed Active Problems:   Anemia   Hyperkalemia   High anion gap metabolic acidosis   Metabolic encephalopathy   Anemia, posthemorrhagic, acute   Acute gastric ulcer with hemorrhage   Patient Summary: Kathy Frank is a 47 y.o. 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 pylori.  Post EGD recommendations of clear liquid diet which has been advanced to full liquid diet today.  She has not had any more melenic stools and was advised to notify nursing when she does have another stool.  Hemoglobin remained stable at 7.6 s/p a total of 3 units since admission.  Appreciate GIs evaluation and management.  Plan for patient to follow-up with GI outpatient for repeat EGD - IV PPI for 72 hours and then transition to pantoprazole 40 mg twice daily for 4 weeks then daily - Full liquid diet today and advance to soft diet tomorrow - Monitor stool output, if repeat melenic stools we will get stat H&H  Uremic encephalopathy, resolved Anion gap metabolic acidosis Hyperkalemia,  resolved ESRD on HD TTS Mental status stable at baseline.  BUN is 108 with a phosphorus of 7.1 and bicarb of 21 with an anion gap of 20.  Reported plan was HD yesterday but this did not occur.  HD today. - HD per nephrology - Daily renal function panel - Continue home Lokelma  Pain over right BKA stump, resolved Pain over stump has resolved after lidocaine patch, Voltaren gel and 1 dose of Dilaudid last night.  PAD s/p right BKA and left AKA Patient is prescribed aspirin 81 mg daily outpatient for management of her PAD.  With recent bleeding gastric ulcer most likely secondary to overuse of BC powder we discussed her 81 mg aspirin with GI who recommend restarting this today. - Aspirin 81 mg daily, heavily counseled on not using any higher doses of aspirin  Headache with history of migraines Patient has headaches at home frequently and has been taking BC powder for these at home.  Counseled on the use of BC powder and Goody's powder. - Tylenol 1000 mg every 6 as needed - May repeat diphenhydramine IV 25 mg and prochlorperazine IV 10 mg if she is having another headache that is not responding to Tylenol  Type 2 diabetes -Continue home Lantus 15 units daily and gabapentin 100 mg daily  Hypertension Hyperlipidemia Continue atorvastatin 80 mg daily Restart amlodipine 10 mg daily Hold olmesartan, may restart if having consistent high pressures  HIV Continue home Biktarvy.  Diet: NPO IVF: None, VTE: None Code: Full   Dispo: Pending stabilization of anemia and completed IV PPI course.  Johny Blamer, DO Internal Medicine Resident PGY-1 Pager: [phone]  Please contact the on call pager after 5 pm and on weekends at [phone].

## 2022-10-15 NOTE — Progress Notes (Signed)
[facility] Progress Note   Subjective: Pleasant, alert and oriented. On HD tolerating with out issues. Raise EDW on discharge.     Objective Vitals:   10/15/22 0742 10/15/22 0841 10/15/22 0848 10/15/22 0900  BP: 131/78 (!) 158/76 (!) 141/67 (!) 146/75  Pulse: 78 80 77 76  Resp: '14 12 12 11  '$ Temp:  98 F (36.7 C)    TempSrc: Axillary Oral    SpO2: 100% 98% 100% 100%  Weight:  81 kg    Height:       Physical Exam General: Chronically ill appearing female in NAD Neuro: Non focal, oriented X 3 Heart: S1,S2 RRR SR/ST on monitor. No M/R/G Lungs: CTAB A/P Abdomen: Soft, NABS Extremities: R BKA, L AKA. No stump edema Dialysis Access: L AVF Aneurysmal + T/B IV Access: R Tunneled Centra line Drsg intact.  Additional Objective Labs: Basic Metabolic Panel: Recent Labs  Lab 10/13/22 0619 10/13/22 1048 10/14/22 0600 10/15/22 0517  NA 137 138 136 138  K 4.1 3.9 4.1 3.9  CL 93* 93* 96* 97*  CO2 16* 22 20* 21*  GLUCOSE 225* 197* 237* 103*  BUN 70* 79* 94* 108*  CREATININE 5.57* 6.10* 8.02* 9.94*  CALCIUM 8.4* 8.4* 8.0* 7.8*  PHOS 6.3*  --  6.9* 7.1*   Liver Function Tests: Recent Labs  Lab 10/12/22 1720 10/13/22 0619 10/13/22 1048 10/14/22 0511 10/14/22 0600 10/15/22 0517  AST 12*  --  109* 92*  --   --   ALT 9  --  83* 75*  --   --   ALKPHOS 36*  --  71 58  --   --   BILITOT 0.8  --  0.6 <0.1*  --   --   PROT 7.0  --  7.0 7.2  --   --   ALBUMIN 2.7*   < > 2.9* 2.9* 3.0* 2.6*   < > = values in this interval not displayed.   Recent Labs  Lab 10/12/22 1720  LIPASE 45   CBC: Recent Labs  Lab 10/12/22 1720 10/13/22 0619 10/13/22 1048 10/13/22 2024 10/14/22 1801 10/15/22 0000 10/15/22 0517  WBC 10.1 16.6* 15.0*  --   --   --   --   NEUTROABS 6.5  --   --   --   --   --   --   HGB 6.1* 7.2* 6.7*   < > 7.7* 7.3* 7.6*  HCT 19.7* 21.4* 20.3*   < > 24.1* 22.4* 23.4*  MCV 100.0 92.6 91.9  --   --   --   --   PLT 438* 364 342  --   --   --   --     < > = values in this interval not displayed.   Blood Culture    Component Value Date/Time   SDES  06/25/2022 1638    BLOOD RIGHT HAND BOTTLES DRAWN AEROBIC AND ANAEROBIC Blood Culture results may not be optimal due to an excessive volume of blood received in culture bottles   SPECREQUEST NONE 06/25/2022 1638   CULT  06/25/2022 1638    NO GROWTH 5 DAYS Performed at [facility], [place] [address] [place], [place] [address]    REPTSTATUS 06/30/2022 FINAL 06/25/2022 1638    Cardiac Enzymes: No results for input(s): "CKTOTAL", "CKMB", "CKMBINDEX", "TROPONINI" in the last 168 hours. CBG: Recent Labs  Lab 10/14/22 1228 10/14/22 1629 10/14/22 1803 10/14/22 2113 10/15/22 0740  GLUCAP 170* 288* 218* 263* 100*  Iron Studies: No results for input(s): "IRON", "TIBC", "TRANSFERRIN", "FERRITIN" in the last 72 hours. '@[email or web address]'$ @ Studies/Results: IR US Guide Vasc Access Right  Result Date: 10/13/2022 INDICATION: no IV access, need central access.  ESRD patient. EXAM: ULTRASOUND AND FLUOROSCOPIC GUIDED PLACEMENT OF TUNNELED CENTRAL VENOUS CATHETER MEDICATIONS: None. ANESTHESIA/SEDATION: Local anesthetic was administered. FLUOROSCOPY TIME:  Fluoroscopic dose; 5 mGy COMPLICATIONS: None immediate. PROCEDURE: Informed written consent was obtained from the patient and/or patient's representative after a discussion of the risks, benefits, and alternatives to treatment. Questions regarding the procedure were encouraged and answered. The RIGHT neck and chest were prepped with chlorhexidine in a sterile fashion, and a sterile drape was applied covering the operative field. Maximum barrier sterile technique with sterile gowns and gloves were used for the procedure. A timeout was performed prior to the initiation of the procedure. After the overlying soft tissues were anesthetized, a small venotomy incision was created and a micropuncture kit was utilized to access the internal jugular vein. Real-time  ultrasound guidance was utilized for vascular access including the acquisition of a permanent ultrasound image documenting patency of the accessed vessel. The microwire was utilized to measure appropriate catheter length. The micropuncture sheath was exchanged for a peel-away sheath over a guidewire. A 5 Fr dual lumen tunneled central venous catheter measuring 25 cm was tunneled in a retrograde fashion from the anterior chest wall to the venotomy incision. The catheter was then placed through the peel-away sheath with tip ultimately positioned at the superior caval-atrial junction. Final catheter positioning was confirmed and documented with a spot radiographic image. The catheter aspirates and flushes normally. The catheter was flushed with appropriate volume heparin dwells. The catheter exit site was secured with a 2-0 nylon retention suture. The venotomy incision was closed with Dermabond. Dressings were applied. The patient tolerated the procedure well without immediate post procedural complication. FINDINGS: After catheter placement, the tip lies within the superior apect of the right atrium. The catheter aspirates and flushes normally and is ready for immediate use. IMPRESSION: Successful placement of 25 cm dual lumen tunneled central venous "Powerline" catheter via the RIGHT internal jugular vein The tip of the catheter is positioned within the proximal RIGHT atrium. The catheter is ready for immediate use. Michaelle Birks, MD [facility] Radiology Electronically Signed   By: Michaelle Birks M.D.   On: 10/13/2022 13:16   IR Fluoro Guide CV Line Right  Result Date: 10/13/2022 INDICATION: no IV access, need central access.  ESRD patient. EXAM: ULTRASOUND AND FLUOROSCOPIC GUIDED PLACEMENT OF TUNNELED CENTRAL VENOUS CATHETER MEDICATIONS: None. ANESTHESIA/SEDATION: Local anesthetic was administered. FLUOROSCOPY TIME:  Fluoroscopic dose; 5 mGy COMPLICATIONS: None  immediate. PROCEDURE: Informed written consent was obtained from the patient and/or patient's representative after a discussion of the risks, benefits, and alternatives to treatment. Questions regarding the procedure were encouraged and answered. The RIGHT neck and chest were prepped with chlorhexidine in a sterile fashion, and a sterile drape was applied covering the operative field. Maximum barrier sterile technique with sterile gowns and gloves were used for the procedure. A timeout was performed prior to the initiation of the procedure. After the overlying soft tissues were anesthetized, a small venotomy incision was created and a micropuncture kit was utilized to access the internal jugular vein. Real-time ultrasound guidance was utilized for vascular access including the acquisition of a permanent ultrasound image documenting patency of the accessed vessel. The microwire was utilized to measure appropriate catheter length. The micropuncture sheath was exchanged for a  peel-away sheath over a guidewire. A 5 Fr dual lumen tunneled central venous catheter measuring 25 cm was tunneled in a retrograde fashion from the anterior chest wall to the venotomy incision. The catheter was then placed through the peel-away sheath with tip ultimately positioned at the superior caval-atrial junction. Final catheter positioning was confirmed and documented with a spot radiographic image. The catheter aspirates and flushes normally. The catheter was flushed with appropriate volume heparin dwells. The catheter exit site was secured with a 2-0 nylon retention suture. The venotomy incision was closed with Dermabond. Dressings were applied. The patient tolerated the procedure well without immediate post procedural complication. FINDINGS: After catheter placement, the tip lies within the superior apect of the right atrium. The catheter aspirates and flushes normally and is ready for immediate use. IMPRESSION: Successful placement of  25 cm dual lumen tunneled central venous "Powerline" catheter via the RIGHT internal jugular vein The tip of the catheter is positioned within the proximal RIGHT atrium. The catheter is ready for immediate use. Michaelle Birks, MD [facility] Radiology Electronically Signed   By: Michaelle Birks M.D.   On: 10/13/2022 13:16   CT HEAD WO CONTRAST (5MM)  Result Date: 10/13/2022 CLINICAL DATA:  Altered mental status EXAM: CT HEAD WITHOUT CONTRAST TECHNIQUE: Contiguous axial images were obtained from the base of the skull through the vertex without intravenous contrast. RADIATION DOSE REDUCTION: This exam was performed according to the departmental dose-optimization program which includes automated exposure control, adjustment of the mA and/or kV according to patient size and/or use of iterative reconstruction technique. COMPARISON:  CT Brain 09/15/22 FINDINGS: Motion degraded exam Brain: No hemorrhage. No CT evidence of an acute cortical infarct. Redemonstrated asymmetric hypodensity in the periventricular white matter on right. No hydrocephalus. No CT evidence of an acute cortical infarct. Cavum septum pellucidum et vergae. Vascular: There are are diffuse atherosclerotic vascular calcifications in keeping with the patient's history of diabetes. Skull: Normal. Negative for fracture or focal lesion. Sinuses/Orbits: No mastoid or middle ear effusion. Mild mucosal thickening in the left sphenoid sinus with focal occlusion1 of the left sphenoethmoidal recess. Bilateral orbits are unremarkable. There are small punctate radiodensities in the subcutaneous soft tissues overlying the nasal bridge, favored to represent dermal calcifications and less likely foreign bodies. Other: None IMPRESSION: No hemorrhage or CT evidence of an acute cortical infarct. There may be a possible age indeterminate infarct in the left thalamus. Electronically Signed   By: Marin Roberts M.D.   On: 10/13/2022  13:08   Medications:  ferric gluconate (FERRLECIT) IVPB     lactated ringers     pantoprazole 8 mg/hr (10/15/22 0131)    acetaminophen  1,000 mg Oral Q6H   Or   acetaminophen  650 mg Rectal Q6H   amLODipine  10 mg Oral Daily   atorvastatin  80 mg Oral Daily   bictegravir-emtricitabine-tenofovir AF  1 tablet Oral Daily   calcitRIOL  1.25 mcg Oral Q T,Th,Sa-HD   calcium acetate  2,001 mg Oral TID WC   Chlorhexidine Gluconate Cloth  6 each Topical Q0600   darbepoetin (ARANESP) injection - DIALYSIS  200 mcg Subcutaneous Q Thu-1800   diclofenac Sodium  4 g Topical QID   gabapentin  100 mg Oral QHS   insulin aspart  0-9 Units Subcutaneous TID WC   insulin glargine-yfgn  15 Units Subcutaneous QHS   lidocaine  1 patch Transdermal Q24H   multivitamin  1 tablet Oral Once per day on Tue  Wed Thu   [START ON 10/16/2022] pantoprazole  40 mg Intravenous Q12H   senna-docusate  1 tablet Oral QHS   sodium zirconium cyclosilicate  5 g Oral Daily     TTS [facility] 4h  400/1.5    78kg  2K/ 3Ca bath  LUA AVF   - Hep none - last HD 3/2, post wt 80.3kg - rocaltrol 1.0 mcg po tiw - venofer '100mg'$  tiw thru 3/14 3/10 doses given at [facility].  - mircera 200 mcg q 2, last 2/15, does not appear she received it on 2/29   Home meds include - albuterol, amlodipine/ olmesartan, biktarvy, rocaltrol, norvasc 10, lipitor, phoslo 2 ac tid, aspirin, prns/ vits/ supps    Assessment/Plan: SOB-Resolved.  Melena/Symptomatic Anemia: HGB 6.1 on admission, HGB 7.2 on 10/14/2022. S/P 3 units PRBCs. GI consulted. Endo 10/14/2022 with esophagitis, nonbleeding gastric ulcer with clot-clips applied. Bx for H pylori obtained. Follow HGB, Avoid NSAIDs  Per primary.   AMS-She has not missed HD. BUN elevated on admission, improved post HD. Concerned elevation in BUN/Low HGB is reflective of GIB. Discussed with primary. Do not see sedating meds or excessive use of gabapentin on med list. Per primary. Now resolved. ESRD - on HD TTS today.  Next HD 10/17/2022. HTN/ volume - Hypertensive on admission, now BP on soft side Net UF 1.7. On Amlodipine-olmesartan as OP. Currently on hold. Volume seems OK. Last wt if accurate at OP EDW 78 kg. UF as tolerated, optimize volume with HD.  Anemia w/ h/o heavy recent menses; now possible GIB. Please see # 2.Give ESA with next HD. Continue Fe load.  MBD ckd - CCa in range; check PO4 for binder management. Cont po vdra and phoslo as binder for now. HIV  - per primary team. DM2 Recent PNA/Rib pain  Migraines - present since MI; Needs OP workup and alternate therapy to control pain.   Lynnett Langlinais H. Sosha Shepherd NP-C 10/15/2022, 9:29 AM  [employer] [phone]

## 2022-10-15 NOTE — Final Progress Note (Signed)
Progress Note   Subjective  Patient did well overnight. Tolerating clears. No further bleeding symptoms. Hgb stable.    Objective   Vital signs in last 24 hours: Temp:  [97.7 F (36.5 C)-98.8 F (37.1 C)] 98 F (36.7 C) (03/07 0841) Pulse Rate:  [76-91] 78 (03/07 1100) Resp:  [10-19] 13 (03/07 1100) BP: (99-169)/(43-78) 128/70 (03/07 1100) SpO2:  [97 %-100 %] 100 % (03/07 1100) Weight:  [81 kg] 81 kg (03/07 0841) Last BM Date : 10/13/22 General:    AA female in NAD Neurologic:  Alert and oriented,  grossly normal neurologically. Psych:  Cooperative. Normal mood and affect.  Intake/Output from previous day: 03/06 0701 - 03/07 0700 In: 555 [P.O.:240; Blood:315] Out: -  Intake/Output this shift: No intake/output data recorded.  Lab Results: Recent Labs    10/12/22 1720 10/13/22 0619 10/13/22 1048 10/13/22 2024 10/14/22 1801 10/15/22 0000 10/15/22 0517  WBC 10.1 16.6* 15.0*  --   --   --   --   HGB 6.1* 7.2* 6.7*   < > 7.7* 7.3* 7.6*  HCT 19.7* 21.4* 20.3*   < > 24.1* 22.4* 23.4*  PLT 438* 364 342  --   --   --   --    < > = values in this interval not displayed.   BMET Recent Labs    10/13/22 1048 10/14/22 0600 10/15/22 0517  NA 138 136 138  K 3.9 4.1 3.9  CL 93* 96* 97*  CO2 22 20* 21*  GLUCOSE 197* 237* 103*  BUN 79* 94* 108*  CREATININE 6.10* 8.02* 9.94*  CALCIUM 8.4* 8.0* 7.8*   LFT Recent Labs    10/14/22 0511 10/14/22 0600 10/15/22 0517  PROT 7.2  --   --   ALBUMIN 2.9*   < > 2.6*  AST 92*  --   --   ALT 75*  --   --   ALKPHOS 58  --   --   BILITOT <0.1*  --   --   BILIDIR <0.1  --   --   IBILI NOT CALCULATED  --   --    < > = values in this interval not displayed.   PT/INR Recent Labs    10/12/22 1834  LABPROT 15.6*  INR 1.3*    Studies/Results:     Assessment / Plan:    47 y/o female here for the following:  Upper GI bleed secondary to gastric ulcer Anemia ESRD on HD NSAID use  EGD yesterday showed large  gastric ulcer with adherent clot. Clips placed but ulcer was fibrotic, unable to close it completely, but she had stopped bleeding with PPI alone. Did well on clear liquids, no further bleeding, Hgb is stable. Biopsies pending, rule out H pylori, although suspect her ulcers are due to NSAID use.   Moving forward, would advance to full liquids today. Continue IV protonix drip for 72 hours total and then '40mg'$  twice daily for 4 weeks, and then once daily thereafter. She needs to completely avoid all NSAIDs moving forward. Recommend a follow up EGD as outpatient to confirm mucosal healing of this lesion.   RECOMMEND: - okay to advance to full liquid diet. If she does well on that today then soft diet tomorrow - can check Hgb once daily at this point, monitor for recurrent bleeding - avoid all NSAIDs moving forward - continue IV protonix for 72 hours and then '40mg'$  twice daily for 4 weeks, and then daily thereafter -  await pathology results, rule out H pylori - can follow up as outpatient and coordinate repeat EGD in upcoming months if she is willing (Dr. Henrene Pastor would be her [facility] based on consultation in the past)  Call with questions or symptoms of recurrent bleeding moving forward.  We will sign off for now if she is otherwise stable.  Jolly Mango, MD [facility] Gastroenterology

## 2022-10-15 NOTE — Progress Notes (Addendum)
Received patient in bed ,awake,alert and oriented x 4.  Access used : Left arm fistula that worked well.  Duration of treatment : 3.48 hrs  Medicine given: Ferric gluconate.                           Zofran 4 mg IV.  Fluid removed:  2.3 liters,achieved fluid goal 2.0-2.5 liters  Hemodialysis issue/Comment She tolerated her treatment very well she/her/hers vomited midway into her treatment.She quit on her last 12 minutes of her treatment.  Hand off to the patient's nurse.

## 2022-10-15 NOTE — Consult Note (Addendum)
   [facility] Inpatient Consult   10/15/2022  Kathy Frank May 03, 1976 [id number]  [facility] [ACO] Patient: [employer]  Primary Care Provider:  [name], MD with [facility] which is listed to provide the transition of care follow up.   Patient screened for less than 30 days readmission hospitalization with noted extreme high risk score for unplanned readmission risk  and to assess for potential [facility] service needs for post hospital transition for care coordination.  Review of patient's electronic medical record reveals patient is is admitted with UGI and has HD on TTS. Patient has been reached by [facility] team for ED follow up.  10/16/22 1555: Came by to speak with patient regarding post hospital follow up. Patient is off the floor. Currently, showing patient for discharge sometime today.  Plan:  Continue to follow progress and disposition to assess for post hospital community care coordination/management needs.  Referral request for community care coordination: following for closer follow up needs  Of note, [place] does not replace or interfere with any arrangements made by the Inpatient Transition of Care team.  For questions contact:   Natividad Brood, RN BSN [place]  [phone] business mobile phone Toll free office [phone]  *[place]  [phone] Fax number: [phone] [email or web address]

## 2022-10-15 NOTE — Progress Notes (Signed)
Pt receives out-pt HD at [facility]) on TTS. Will assist as needed.   Melven Sartorius [phone]

## 2022-10-16 ENCOUNTER — Telehealth: Payer: Self-pay

## 2022-10-16 ENCOUNTER — Other Ambulatory Visit (HOSPITAL_COMMUNITY): Payer: Self-pay

## 2022-10-16 ENCOUNTER — Inpatient Hospital Stay (HOSPITAL_COMMUNITY): Payer: 59

## 2022-10-16 ENCOUNTER — Encounter (HOSPITAL_COMMUNITY): Payer: Self-pay

## 2022-10-16 ENCOUNTER — Telehealth (HOSPITAL_COMMUNITY): Payer: Self-pay | Admitting: Pharmacy Technician

## 2022-10-16 DIAGNOSIS — K922 Gastrointestinal hemorrhage, unspecified: Secondary | ICD-10-CM | POA: Diagnosis not present

## 2022-10-16 DIAGNOSIS — D649 Anemia, unspecified: Secondary | ICD-10-CM | POA: Diagnosis not present

## 2022-10-16 HISTORY — PX: IR REMOVAL TUN CV CATH W/O FL: IMG2289

## 2022-10-16 LAB — RENAL FUNCTION PANEL
Albumin: 2.6 g/dL — ABNORMAL LOW (ref 3.5–5.0)
Anion gap: 16 — ABNORMAL HIGH (ref 5–15)
BUN: 42 mg/dL — ABNORMAL HIGH (ref 6–20)
CO2: 28 mmol/L (ref 22–32)
Calcium: 8.4 mg/dL — ABNORMAL LOW (ref 8.9–10.3)
Chloride: 93 mmol/L — ABNORMAL LOW (ref 98–111)
Creatinine, Ser: 6.1 mg/dL — ABNORMAL HIGH (ref 0.44–1.00)
GFR, Estimated: 8 mL/min — ABNORMAL LOW (ref >60)
Glucose, Bld: 84 mg/dL (ref 70–99)
Phosphorus: 4.5 mg/dL (ref 2.5–4.6)
Potassium: 3.6 mmol/L (ref 3.5–5.1)
Sodium: 137 mmol/L (ref 135–145)

## 2022-10-16 LAB — CBC
HCT: 24.1 % — ABNORMAL LOW (ref 36.0–46.0)
Hemoglobin: 8 g/dL — ABNORMAL LOW (ref 12.0–15.0)
MCH: 30.4 pg (ref 26.0–34.0)
MCHC: 33.2 g/dL (ref 30.0–36.0)
MCV: 91.6 fL (ref 80.0–100.0)
Platelets: 242 10*3/uL (ref 150–400)
RBC: 2.63 MIL/uL — ABNORMAL LOW (ref 3.87–5.11)
RDW: 18.7 % — ABNORMAL HIGH (ref 11.5–15.5)
WBC: 9.1 10*3/uL (ref 4.0–10.5)
nRBC: 0.8 % — ABNORMAL HIGH (ref 0.0–0.2)

## 2022-10-16 LAB — GLUCOSE, CAPILLARY
Glucose-Capillary: 167 mg/dL — ABNORMAL HIGH (ref 70–99)
Glucose-Capillary: 173 mg/dL — ABNORMAL HIGH (ref 70–99)

## 2022-10-16 MED ORDER — AMLODIPINE BESYLATE 10 MG PO TABS
10.0000 mg | ORAL_TABLET | Freq: Every day | ORAL | 0 refills | Status: DC
  Filled 2022-10-16: qty 30, 30d supply, fill #0

## 2022-10-16 MED ORDER — DIPHENHYDRAMINE HCL 50 MG/ML IJ SOLN
25.0000 mg | Freq: Three times a day (TID) | INTRAMUSCULAR | Status: AC | PRN
  Administered 2022-10-16: 25 mg via INTRAVENOUS
  Filled 2022-10-16: qty 1

## 2022-10-16 MED ORDER — TOPIRAMATE 25 MG PO TABS
ORAL_TABLET | ORAL | 0 refills | Status: DC
  Filled 2022-10-16: qty 21, 14d supply, fill #0

## 2022-10-16 MED ORDER — PANTOPRAZOLE SODIUM 40 MG PO TBEC
40.0000 mg | DELAYED_RELEASE_TABLET | Freq: Two times a day (BID) | ORAL | Status: DC
  Administered 2022-10-16: 40 mg via ORAL
  Filled 2022-10-16: qty 1

## 2022-10-16 MED ORDER — UBRELVY 50 MG PO TABS
50.0000 mg | ORAL_TABLET | ORAL | 0 refills | Status: DC | PRN
  Filled 2022-10-16: qty 8, 30d supply, fill #0

## 2022-10-16 MED ORDER — ATORVASTATIN CALCIUM 80 MG PO TABS
80.0000 mg | ORAL_TABLET | Freq: Every day | ORAL | 11 refills | Status: DC
  Filled 2022-10-16: qty 30, 30d supply, fill #0

## 2022-10-16 MED ORDER — PROCHLORPERAZINE EDISYLATE 10 MG/2ML IJ SOLN
10.0000 mg | Freq: Three times a day (TID) | INTRAMUSCULAR | Status: AC | PRN
  Administered 2022-10-16: 10 mg via INTRAVENOUS
  Filled 2022-10-16: qty 2

## 2022-10-16 MED ORDER — PANTOPRAZOLE SODIUM 40 MG PO TBEC
DELAYED_RELEASE_TABLET | ORAL | 1 refills | Status: DC
  Filled 2022-10-16: qty 60, 32d supply, fill #0

## 2022-10-16 NOTE — Hospital Course (Addendum)
Kathy Frank is a 47 y.o. 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  She received a unit of blood in the ED.  She then began to have multiple melanotic stools and received another unit of blood with her hemoglobin rising to 7.1.  After hemoglobin recovery she was more alert and oriented and at baseline.  GI was consulted for concerns of upper GI bleed and she underwent EGD with 1 more unit transfused prior to procedure.  EGD revealed nonbleeding gastric ulcers, 1 with an adherent clot which was clipped.  She was kept on IV PPI for 72 hours and then transition to pantoprazole 40 mg twice daily for 4 weeks then daily after that.  She was advanced from a clear liquid diet to a soft diet prior to discharge and was having normal bowel movements without blood on her day of discharge.  Hemoglobin on discharge was continuing to improve at a level of 8.0.  She will follow-up with GI outpatient for repeat EGD in the coming months.   Uremic encephalopathy, resolved Anion gap metabolic acidosis Hyperkalemia, resolved ESRD on HD TTS Patient was initially admitted with altered mental status mental status and a BUN of 121 and potassium of 6.5.  She was taken for emergent hemodialysis.  Metabolic panel improved but patient was still altered.  She was found to have a significant lactic acidosis with a lactic acid of 4.4.  We obtained a CT of the head without contrast did not show any acute abnormalities.  After resolution of her acute anemia and dialysis she did return to baseline pretty quickly and  remained there.  She continued to have an anion gap metabolic acidosis likely related to her chronic renal disease and hypophosphatemia which was improved with dialysis prior to discharge.  She will follow-up with her outpatient dialysis on her regular schedule.    Pain over right BKA stump, resolved During admission patient complained of pain over the lateral portion of her right BKA after possible trauma when she was confused.  On exam there were no signs of trauma, infection, wound dehiscence, or other abnormalities.  Pain was overall intermittent and responded partially to Voltaren gel and Tylenol.  It was improving on discharge and she was recommended to call her PCP if it worsened.   PAD s/p right BKA and left AKA Patient is prescribed aspirin 81 mg daily outpatient for management of her PAD.  With recent bleeding gastric ulcer most likely secondary to overuse of BC powder we discussed her 81 mg aspirin with GI who recommend she continue this medication.  She was also heavily counseled on not using higher doses of aspirin.   Headache with history of migraines Patient notes headaches that occur about 3 or 4 times a week and have been happening for over a year.  Headaches are bilateral, last from 30 minutes to 2 hours, are associated with photophobia but not nausea.  She has been taking high doses of aspirin with benefit for headaches.  Tylenol did not help.  She did respond to headache cocktail of Benadryl  and Compazine here.  On discharge we started her on migraine prophylaxis and abortive therapy with topiramate and Ubrelvy.  Comprehensive discussion on avoiding NSAIDs for her headaches.    Type 2 diabetes Continued home Lantus 15 units daily and gabapentin 100 mg daily.   Hypertension On admission patient was hypotensive with acute blood loss.  She did develop some mild hypertension and she was restarted on her home amlodipine 10 mg daily but her olmesartan was held.  She stayed normotensive  and recommended her to not take her olmesartan until following up with her PCP.  Hyperlipidemia Continued atorvastatin 80 mg daily   HIV Continued home Biktarvy.

## 2022-10-16 NOTE — Telephone Encounter (Signed)
-----   Message from Yetta Flock, MD sent at 10/15/2022  5:08 PM EST ----- Regarding: follow up This patient will need follow up post hospitalization in a few months. Likely not discharged for a few days yet. Seen by Dr. Henrene Pastor in 2016 - can be seen by APP or him. Thanks

## 2022-10-16 NOTE — Telephone Encounter (Signed)
Pt scheduled to see Dr. Henrene Pastor 12/22/22 at 10:20am.

## 2022-10-16 NOTE — Discharge Summary (Signed)
Name: Kathy Frank MRN: [id number] DOB: [birth year] 47 y.o. PCP: [name], MD  Date of Admission: 10/12/2022  4:41 PM Date of Discharge: 10/16/2022 Attending Physician: Dr.  Saverio Danker  Discharge Diagnosis: Principal Problem:   Upper GI bleed Active Problems:   Anemia   Hyperkalemia   High anion gap metabolic acidosis   Metabolic encephalopathy   Anemia, posthemorrhagic, acute   Acute gastric ulcer with hemorrhage    Discharge Medications: Allergies as of 10/16/2022   No Known Allergies      Medication List     STOP taking these medications    amLODipine-olmesartan 10-40 MG tablet Commonly known as: AZOR   cyclobenzaprine 5 MG tablet Commonly known as: FLEXERIL   ondansetron 4 MG tablet Commonly known as: Zofran   oxyCODONE 5 MG immediate release tablet Commonly known as: Roxicodone       TAKE these medications    Accu-Chek Softclix Lancets lancets Use as instructed   amLODipine 10 MG tablet Commonly known as: NORVASC Take 1 tablet (10 mg total) by mouth daily. Start taking on: October 17, 2022   atorvastatin 80 MG tablet Commonly known as: LIPITOR Take 1 tablet (80 mg total) by mouth daily.   Biktarvy 50-200-25 MG Tabs tablet Generic drug: bictegravir-emtricitabine-tenofovir AF Take 1 tablet by mouth daily. OVERDUE FOR OFFICE FOLLOW UP - PLEASE CALL OUR OFFICE TO SCHEDULE [phone] What changed: additional instructions   calcitRIOL 0.25 MCG capsule Commonly known as: ROCALTROL Take 5 capsules (1.25 mcg total) by mouth Every Tuesday,Thursday,and Saturday with dialysis.   calcium acetate 667 MG capsule Commonly known as: PHOSLO Take 2 capsules (1,334 mg total) by mouth 3 (three) times daily with meals.   Darbepoetin Alfa 200 MCG/0.4ML Sosy injection Commonly known as: ARANESP Inject 0.4 mLs (200 mcg total) into the skin every Monday at 6 PM.   Dexcom G7 Receiver Devi Use to check blood sugars cntinuously   Dexcom G7 Sensor Misc Use to check  blood sugar continuously   diclofenac Sodium 1 % Gel Commonly known as: Voltaren Apply 4 g topically 4 (four) times daily.   EQ Aspirin Adult Low Dose 81 MG tablet Generic drug: aspirin EC TAKE 1 TABLET BY MOUTH ONCE DAILY. SWALLOW WHOLE. What changed: See the new instructions.   gabapentin 100 MG capsule Commonly known as: NEURONTIN Take 100 mg by mouth at bedtime.   glucose blood test strip Use as instructed   Lantus SoloStar 100 UNIT/ML Solostar Pen Generic drug: insulin glargine Inject 15 Units into the skin at bedtime.   lidocaine 5 % Commonly known as: LIDODERM Place 1 patch onto the skin daily. Remove & Discard patch within 12 hours or as directed by MD   liraglutide 18 MG/3ML Sopn Commonly known as: VICTOZA Inject 0.6 mg into the skin daily.   Lokelma 5 g packet Generic drug: sodium zirconium cyclosilicate Take 5 g by mouth daily.   multivitamin Tabs tablet Take 1 tablet by mouth daily. What changed:  when to take this additional instructions   pantoprazole 40 MG tablet Commonly known as: Protonix Take 1 tablet (40 mg total) by mouth 2 (two) times daily for 28 days, THEN 1 tablet (40 mg total) daily. Start taking on: October 16, 2022   Pentips 32G X 4 MM Misc Generic drug: Insulin Pen Needle Use as directed   polyethylene glycol 17 g packet Commonly known as: MIRALAX / GLYCOLAX Take 17 g by mouth daily.   ProAir HFA 108 (90 Base) MCG/ACT inhaler Generic  drug: albuterol Inhale 2 puffs into the lungs every 4 (four) hours as needed for wheezing or shortness of breath.   topiramate 25 MG tablet Commonly known as: TOPAMAX Take 1 tablet (25 mg total) by mouth daily for 7 days, THEN 1 tablet (25 mg total) 2 (two) times daily for 7 days. Start taking on: October 16, 2022   Ubrelvy 50 MG Tabs Generic drug: Ubrogepant Take 1 tablet (50 mg total) by mouth as needed (migraine headache). if needed a second dose may be taken at least 2 hours after the initial dose;  MAX 8 doses in a month        Disposition and follow-up:   [name] was discharged from [facility] in Good condition.  At the hospital follow up visit please address:  1.  Follow-up:  a.  Ensure she is not having any more dark bowel movements or other signs of blood loss.  Ensure that she is taking her PPI twice a day.  Also ensure that she has GI follow-up or knows how to get it.    b.  Evaluate for efficacy of topiramate and Ubrelvy.  She should be taking topiramate 25 mg nightly for 1 week and then twice daily after that.  I would be cautious in increasing further for migraine prophylaxis due to her renal disease.  Also ensure that she is not using her Roselyn Meier more than the recommended 8 times a month.   c.  On discharge olmesartan was held with normotension.  Amlodipine 10 mg daily was continued.  Evaluate to see if she can restart her combo Azor.  2.  Labs / imaging needed at time of follow-up: CBC, BMP  3.  Pending labs/ test needing follow-up: None  Follow-up Appointments:  Follow-up Information     [name], MD Follow up on 10/19/2022.   Specialty: Internal Medicine Why: at 10:45 AM, please arrive 15 minutes early Contact information: [place] [id number] [phone]                 Hospital Course by problem list: Kathy Frank is a 47 y.o. 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  She received a unit of blood in the ED.  She then began to have multiple melanotic stools and received another  unit of blood with her hemoglobin rising to 7.1.  After hemoglobin recovery she was more alert and oriented and at baseline.  GI was consulted for concerns of upper GI bleed and she underwent EGD with 1 more unit transfused prior to procedure.  EGD revealed nonbleeding gastric ulcers, 1 with an adherent clot which was clipped.  She was kept on IV PPI for 72 hours and then transition to pantoprazole 40 mg twice daily for 4 weeks then daily after that.  She was advanced from a clear liquid diet to a soft diet prior to discharge and was having normal bowel movements without blood on her day of discharge.  Hemoglobin on discharge was continuing to improve at a level of 8.0.  She will follow-up with GI outpatient for repeat EGD in the coming months.   Uremic encephalopathy, resolved Anion gap metabolic acidosis Hyperkalemia, resolved ESRD on HD TTS Patient was initially admitted with altered mental status mental status and a BUN of 121 and potassium of 6.5.  She was taken for emergent hemodialysis.  Metabolic panel improved but patient was still altered.  She was found to have a significant lactic acidosis with a lactic acid of 4.4.  We obtained a CT of the head without contrast did not show any acute abnormalities.  After resolution of her acute anemia and dialysis she did return to baseline pretty quickly and remained there.  She continued to have an anion gap metabolic acidosis likely related to her chronic renal disease and hypophosphatemia which was improved with dialysis prior to discharge.  She will follow-up with her outpatient dialysis on her regular schedule.    Pain over right BKA stump, resolved During admission patient complained of pain over the lateral portion of her right BKA after possible trauma when she was confused.  On exam there were no signs of trauma, infection, wound dehiscence, or other abnormalities.  Pain was overall intermittent and responded partially to Voltaren gel and Tylenol.   It was improving on discharge and she was recommended to call her PCP if it worsened.   PAD s/p right BKA and left AKA Patient is prescribed aspirin 81 mg daily outpatient for management of her PAD.  With recent bleeding gastric ulcer most likely secondary to overuse of BC powder we discussed her 81 mg aspirin with GI who recommend she continue this medication.  She was also heavily counseled on not using higher doses of aspirin.   Headache with history of migraines Patient notes headaches that occur about 3 or 4 times a week and have been happening for over a year.  Headaches are bilateral, last from 30 minutes to 2 hours, are associated with photophobia but not nausea.  She has been taking high doses of aspirin with benefit for headaches.  Tylenol did not help.  She did respond to headache cocktail of Benadryl and Compazine here.  On discharge we started her on migraine prophylaxis and abortive therapy with topiramate and Ubrelvy.  Comprehensive discussion on avoiding NSAIDs for her headaches.    Type 2 diabetes Continued home Lantus 15 units daily and gabapentin 100 mg daily.   Hypertension On admission patient was hypotensive with acute blood loss.  She did develop some mild hypertension and she was restarted on her home amlodipine 10 mg daily but her olmesartan was held.  She stayed normotensive and recommended her to not take her olmesartan until following up with her PCP.  Hyperlipidemia Continued atorvastatin 80 mg daily   HIV Continued home Biktarvy.   Discharge Subjective: Patient is doing overall well this morning without any returns of dark tarry stools.  She did have a bowel movement without any blood this morning.  She does not have any abdominal pain, nausea, or vomiting.  Patient is continuing to have recurrent headaches which she states have been happening about 3-4 times a week for at least a year and are associated with photophobia, are bilateral, and can last over 2 hours.   These have been responsive to high doses of aspirin in the past but not Tylenol.  Discharge Exam:   BP (!) 168/71 (BP Location: Right Arm)   Pulse 91   Temp 99.7 F (37.6 C) (Oral)   Resp 10  Ht '5\' 5"'$  (1.651 m)   Wt 77.8 kg   LMP  (LMP Unknown)   SpO2 92%   BMI 28.54 kg/m  Constitutional: Comfortable appearing middle-aged female laying in bed Pulmonary/Chest: No increased work of breathing on room air Abdomen: Normoactive bowel sounds, soft, nontender, nondistended MSK: Stable right BKA and left AKA.  Right BKA with mild to moderate tenderness in the soft tissues laterally and distal to the knee without edema, signs of trauma, or other abnormalities. Neurological: Alert and oriented x 3 without focal deficits Skin: warm and dry  Pertinent Labs, Studies, and Procedures:     Latest Ref Rng & Units 10/16/2022    4:18 AM 10/15/2022    5:17 AM 10/15/2022   12:00 AM  CBC  WBC 4.0 - 10.5 K/uL 9.1     Hemoglobin 12.0 - 15.0 g/dL 8.0  7.6  7.3   Hematocrit 36.0 - 46.0 % 24.1  23.4  22.4   Platelets 150 - 400 K/uL 242          Latest Ref Rng & Units 10/16/2022    4:18 AM 10/15/2022    5:17 AM 10/14/2022    6:00 AM  CMP  Glucose 70 - 99 mg/dL 84  103  237   BUN 6 - 20 mg/dL 42  108  94   Creatinine 0.44 - 1.00 mg/dL 6.10  9.94  8.02   Sodium 135 - 145 mmol/L 137  138  136   Potassium 3.5 - 5.1 mmol/L 3.6  3.9  4.1   Chloride 98 - 111 mmol/L 93  97  96   CO2 22 - 32 mmol/L '28  21  20   '$ Calcium 8.9 - 10.3 mg/dL 8.4  7.8  8.0     IR US Guide Vasc Access Right  Result Date: 10/13/2022 INDICATION: no IV access, need central access.  ESRD patient. EXAM: ULTRASOUND AND FLUOROSCOPIC GUIDED PLACEMENT OF TUNNELED CENTRAL VENOUS CATHETER MEDICATIONS: None. ANESTHESIA/SEDATION: Local anesthetic was administered. FLUOROSCOPY TIME:  Fluoroscopic dose; 5 mGy COMPLICATIONS: None immediate. PROCEDURE: Informed written consent was obtained from the patient and/or patient's representative after a  discussion of the risks, benefits, and alternatives to treatment. Questions regarding the procedure were encouraged and answered. The RIGHT neck and chest were prepped with chlorhexidine in a sterile fashion, and a sterile drape was applied covering the operative field. Maximum barrier sterile technique with sterile gowns and gloves were used for the procedure. A timeout was performed prior to the initiation of the procedure. After the overlying soft tissues were anesthetized, a small venotomy incision was created and a micropuncture kit was utilized to access the internal jugular vein. Real-time ultrasound guidance was utilized for vascular access including the acquisition of a permanent ultrasound image documenting patency of the accessed vessel. The microwire was utilized to measure appropriate catheter length. The micropuncture sheath was exchanged for a peel-away sheath over a guidewire. A 5 Fr dual lumen tunneled central venous catheter measuring 25 cm was tunneled in a retrograde fashion from the anterior chest wall to the venotomy incision. The catheter was then placed through the peel-away sheath with tip ultimately positioned at the superior caval-atrial junction. Final catheter positioning was confirmed and documented with a spot radiographic image. The catheter aspirates and flushes normally. The catheter was flushed with appropriate volume heparin dwells. The catheter exit site was secured with a 2-0 nylon retention suture. The venotomy incision was closed with Dermabond. Dressings were applied. The patient tolerated the procedure well  without immediate post procedural complication. FINDINGS: After catheter placement, the tip lies within the superior apect of the right atrium. The catheter aspirates and flushes normally and is ready for immediate use. IMPRESSION: Successful placement of 25 cm dual lumen tunneled central venous "Powerline" catheter via the RIGHT internal jugular vein The tip of the  catheter is positioned within the proximal RIGHT atrium. The catheter is ready for immediate use. Michaelle Birks, MD [facility] Radiology Electronically Signed   By: Michaelle Birks M.D.   On: 10/13/2022 13:16   IR Fluoro Guide CV Line Right  Result Date: 10/13/2022 INDICATION: no IV access, need central access.  ESRD patient. EXAM: ULTRASOUND AND FLUOROSCOPIC GUIDED PLACEMENT OF TUNNELED CENTRAL VENOUS CATHETER MEDICATIONS: None. ANESTHESIA/SEDATION: Local anesthetic was administered. FLUOROSCOPY TIME:  Fluoroscopic dose; 5 mGy COMPLICATIONS: None immediate. PROCEDURE: Informed written consent was obtained from the patient and/or patient's representative after a discussion of the risks, benefits, and alternatives to treatment. Questions regarding the procedure were encouraged and answered. The RIGHT neck and chest were prepped with chlorhexidine in a sterile fashion, and a sterile drape was applied covering the operative field. Maximum barrier sterile technique with sterile gowns and gloves were used for the procedure. A timeout was performed prior to the initiation of the procedure. After the overlying soft tissues were anesthetized, a small venotomy incision was created and a micropuncture kit was utilized to access the internal jugular vein. Real-time ultrasound guidance was utilized for vascular access including the acquisition of a permanent ultrasound image documenting patency of the accessed vessel. The microwire was utilized to measure appropriate catheter length. The micropuncture sheath was exchanged for a peel-away sheath over a guidewire. A 5 Fr dual lumen tunneled central venous catheter measuring 25 cm was tunneled in a retrograde fashion from the anterior chest wall to the venotomy incision. The catheter was then placed through the peel-away sheath with tip ultimately positioned at the superior caval-atrial junction. Final catheter positioning was  confirmed and documented with a spot radiographic image. The catheter aspirates and flushes normally. The catheter was flushed with appropriate volume heparin dwells. The catheter exit site was secured with a 2-0 nylon retention suture. The venotomy incision was closed with Dermabond. Dressings were applied. The patient tolerated the procedure well without immediate post procedural complication. FINDINGS: After catheter placement, the tip lies within the superior apect of the right atrium. The catheter aspirates and flushes normally and is ready for immediate use. IMPRESSION: Successful placement of 25 cm dual lumen tunneled central venous "Powerline" catheter via the RIGHT internal jugular vein The tip of the catheter is positioned within the proximal RIGHT atrium. The catheter is ready for immediate use. Michaelle Birks, MD [facility] Radiology Electronically Signed   By: Michaelle Birks M.D.   On: 10/13/2022 13:16   CT HEAD WO CONTRAST (5MM)  Result Date: 10/13/2022 CLINICAL DATA:  Altered mental status EXAM: CT HEAD WITHOUT CONTRAST TECHNIQUE: Contiguous axial images were obtained from the base of the skull through the vertex without intravenous contrast. RADIATION DOSE REDUCTION: This exam was performed according to the departmental dose-optimization program which includes automated exposure control, adjustment of the mA and/or kV according to patient size and/or use of iterative reconstruction technique. COMPARISON:  CT Brain 09/15/22 FINDINGS: Motion degraded exam Brain: No hemorrhage. No CT evidence of an acute cortical infarct. Redemonstrated asymmetric hypodensity in the periventricular white matter on right. No hydrocephalus. No CT evidence of an acute cortical  infarct. Cavum septum pellucidum et vergae. Vascular: There are are diffuse atherosclerotic vascular calcifications in keeping with the patient's history of diabetes. Skull: Normal. Negative for fracture  or focal lesion. Sinuses/Orbits: No mastoid or middle ear effusion. Mild mucosal thickening in the left sphenoid sinus with focal occlusion1 of the left sphenoethmoidal recess. Bilateral orbits are unremarkable. There are small punctate radiodensities in the subcutaneous soft tissues overlying the nasal bridge, favored to represent dermal calcifications and less likely foreign bodies. Other: None IMPRESSION: No hemorrhage or CT evidence of an acute cortical infarct. There may be a possible age indeterminate infarct in the left thalamus. Electronically Signed   By: Marin Roberts M.D.   On: 10/13/2022 13:08   DG Chest Portable 1 View  Result Date: 10/12/2022 CLINICAL DATA:  Chest pain and shortness of breath. EXAM: PORTABLE CHEST 1 VIEW COMPARISON:  10/03/2022 FINDINGS: Heart size is upper limits of normal but similar to the previous examination. No overt pulmonary edema. No focal airspace disease. Atherosclerotic calcifications at the aortic arch. Trachea is midline. Negative for a pneumothorax. IMPRESSION: 1. No acute cardiopulmonary disease. 2. Aortic atherosclerosis. Electronically Signed   By: Markus Daft M.D.   On: 10/12/2022 17:35     Discharge Instructions: Discharge Instructions     Call MD for:  difficulty breathing, headache or visual disturbances   Complete by: As directed    Call MD for:  extreme fatigue   Complete by: As directed    Call MD for:  persistant dizziness or light-headedness   Complete by: As directed    Call MD for:  persistant nausea and vomiting   Complete by: As directed    Call MD for:  severe uncontrolled pain   Complete by: As directed    Call MD for:  temperature >100.4   Complete by: As directed    Diet - low sodium heart healthy   Complete by: As directed    Increase activity slowly   Complete by: As directed    No wound care   Complete by: As directed        Signed: Johny Blamer, DO 10/16/2022, 5:37 PM   Pager: [phone]

## 2022-10-16 NOTE — TOC Benefit Eligibility Note (Addendum)
Patient Teacher, English as a foreign language completed.    The patient is currently admitted and upon discharge could be taking Ubrelvy 50 mg tablets.  Requires Prior Authorization  The patient is currently admitted and upon discharge could be taking topiramate (Topamax) 25 mg.  The current 30 day co-pay is $0.00.   The patient is insured through [place] Patient Advocate Specialist [place] Patient Advocate Team Direct Number: [phone]  Fax: [phone]

## 2022-10-16 NOTE — Discharge Instructions (Addendum)
Kathy Frank,  You were recently admitted to [facility] for a bleeding ulcer in your stomach that was caused by using BC powder, Goodys powder, and high dose aspirin.   Continue taking your home medications with the following changes  Start taking Pantoprazole 40 mg twice a day for the next 4 weeks, then once a day after that Amlodipine 10 mg daily Topiramate 25 mg nightly for 1 week and then twice a day for 1 week for that.  Please talk to your doctor about further changes with the dosing Ubrelvy 50 mg once as needed for migraine headache.  If you are using it more than 8 times a month tell your doctor. Stop taking BC powder Goodys powder Ibuprofen, advil Naproxen, aleve Amlodipine/olmesartan (Azor), wait until you follow-up with our clinic to start this back   You should seek further medical care if you have return of dark tarry stools, severe fatigue, shortness of breath, or other worrisome symptoms.  We recommend that you see your primary care doctor in about a week to make sure that you continue to improve. We are so glad that you are feeling better.  Sincerely, Johny Blamer, DO

## 2022-10-16 NOTE — Telephone Encounter (Signed)
Patient Advocate Encounter  Prior Authorization for Roselyn Meier '50MG'$  tablets has been approved.    PA# [id number] Key: [place] [employer] Medicare Part D Electronic Prior Authorization Form Effective dates: 10/16/2022 through 08/10/2023  Patients co-pay is $0.00.     Lyndel Safe, [place] Patient Advocate Specialist [place] Patient Advocate Team Direct Number: [phone]  Fax: [phone]

## 2022-10-16 NOTE — Progress Notes (Signed)
[facility] Progress Note   Subjective:    Seen and examined patient at bedside. Primary care team also at bedside. Plan for discharge today. She can resume HD tomorrow at [facility]  Objective Vitals:   10/15/22 2328 10/15/22 2336 10/16/22 0400 10/16/22 0806  BP: (!) 97/58 (!) 127/55 112/61 (!) 113/42  Pulse: 86 82  91  Resp: '17 16 10 11  '$ Temp: 98.5 F (36.9 C)  98.2 F (36.8 C) 99.7 F (37.6 C)  TempSrc: Oral  Oral Oral  SpO2: 94% 100% 98% 97%  Weight:      Height:       Physical Exam General: Chronically ill appearing female in NAD Neuro: Non focal, oriented X 3 Heart: S1,S2 RRR SR/ST on monitor. No M/R/G Lungs: CTAB A/P Abdomen: Soft, NABS Extremities: R BKA, L AKA. No stump edema Dialysis Access: L AVF Aneurysmal + T/B IV Access: R Tunneled Centra line Drsg intact. \ Filed Weights   10/14/22 1035 10/15/22 0841 10/15/22 1318  Weight: 78 kg 81 kg 77.8 kg    Intake/Output Summary (Last 24 hours) at 10/16/2022 1019 Last data filed at 10/15/2022 1230 Gross per 24 hour  Intake --  Output 2300 ml  Net -2300 ml    Additional Objective Labs: Basic Metabolic Panel: Recent Labs  Lab 10/14/22 0600 10/15/22 0517 10/16/22 0418  NA 136 138 137  K 4.1 3.9 3.6  CL 96* 97* 93*  CO2 20* 21* 28  GLUCOSE 237* 103* 84  BUN 94* 108* 42*  CREATININE 8.02* 9.94* 6.10*  CALCIUM 8.0* 7.8* 8.4*  PHOS 6.9* 7.1* 4.5   Liver Function Tests: Recent Labs  Lab 10/12/22 1720 10/13/22 0619 10/13/22 1048 10/14/22 0511 10/14/22 0600 10/15/22 0517 10/16/22 0418  AST 12*  --  109* 92*  --   --   --   ALT 9  --  83* 75*  --   --   --   ALKPHOS 36*  --  71 58  --   --   --   BILITOT 0.8  --  0.6 <0.1*  --   --   --   PROT 7.0  --  7.0 7.2  --   --   --   ALBUMIN 2.7*   < > 2.9* 2.9* 3.0* 2.6* 2.6*   < > = values in this interval not displayed.   Recent Labs  Lab 10/12/22 1720  LIPASE 45   CBC: Recent Labs  Lab 10/12/22 1720 10/13/22 0619 10/13/22 1048  10/13/22 2024 10/15/22 0000 10/15/22 0517 10/16/22 0418  WBC 10.1 16.6* 15.0*  --   --   --  9.1  NEUTROABS 6.5  --   --   --   --   --   --   HGB 6.1* 7.2* 6.7*   < > 7.3* 7.6* 8.0*  HCT 19.7* 21.4* 20.3*   < > 22.4* 23.4* 24.1*  MCV 100.0 92.6 91.9  --   --   --  91.6  PLT 438* 364 342  --   --   --  242   < > = values in this interval not displayed.   Blood Culture    Component Value Date/Time   SDES  06/25/2022 1638    BLOOD RIGHT HAND BOTTLES DRAWN AEROBIC AND ANAEROBIC Blood Culture results may not be optimal due to an excessive volume of blood received in culture bottles   SPECREQUEST NONE 06/25/2022 1638   CULT  06/25/2022 1638  NO GROWTH 5 DAYS Performed at [facility], [place] [address], [place], [place] [address]    REPTSTATUS 06/30/2022 FINAL 06/25/2022 1638    Cardiac Enzymes: No results for input(s): "CKTOTAL", "CKMB", "CKMBINDEX", "TROPONINI" in the last 168 hours. CBG: Recent Labs  Lab 10/15/22 0740 10/15/22 1404 10/15/22 1705 10/15/22 2138 10/16/22 0819  GLUCAP 100* 135* 149* 119* 167*   Iron Studies: No results for input(s): "IRON", "TIBC", "TRANSFERRIN", "FERRITIN" in the last 72 hours. Lab Results  Component Value Date   INR 1.3 (H) 10/12/2022   INR 1.2 06/27/2022   INR 1.1 05/20/2021   Studies/Results: No results found.  Medications:  ferric gluconate (FERRLECIT) IVPB Stopped (10/15/22 1238)   lactated ringers     pantoprazole 8 mg/hr (10/16/22 0641)    acetaminophen  1,000 mg Oral Q6H   Or   acetaminophen  650 mg Rectal Q6H   amLODipine  10 mg Oral Daily   aspirin EC  81 mg Oral Daily   atorvastatin  80 mg Oral Daily   bictegravir-emtricitabine-tenofovir AF  1 tablet Oral Daily   calcitRIOL  1.25 mcg Oral Q T,Th,Sa-HD   calcium acetate  2,001 mg Oral TID WC   Chlorhexidine Gluconate Cloth  6 each Topical Q0600   darbepoetin (ARANESP) injection - DIALYSIS  200 mcg Subcutaneous Q Thu-1800   diclofenac Sodium  4 g Topical QID    gabapentin  100 mg Oral QHS   insulin aspart  0-9 Units Subcutaneous TID WC   insulin glargine-yfgn  15 Units Subcutaneous QHS   lidocaine  1 patch Transdermal Q24H   multivitamin  1 tablet Oral Once per day on Tue Wed Thu   ondansetron (ZOFRAN) IV  4 mg Intravenous Once   pantoprazole  40 mg Intravenous Q12H   senna-docusate  1 tablet Oral QHS   sodium zirconium cyclosilicate  5 g Oral Daily    Dialysis Orders: TTS [facility] 4h  400/1.5    78kg  2K/ 3Ca bath  LUA AVF   - Hep none - last HD 3/2, post wt 80.3kg - rocaltrol 1.0 mcg po tiw - venofer '100mg'$  tiw thru 3/14 3/10 doses given at [facility].  - mircera 200 mcg q 2, last 2/15, does not appear she received it on 2/29   Home meds include - albuterol, amlodipine/ olmesartan, biktarvy, rocaltrol, norvasc 10, lipitor, phoslo 2 ac tid, aspirin, prns/ vits/ supps   Assessment/Plan: SOB-Resolved.  Melena/Symptomatic Anemia: HGB 6.1 on admission, HGB 7.2 on 10/14/2022. S/P 3 units PRBCs. GI consulted. Endo 10/14/2022 with esophagitis, nonbleeding gastric ulcer with clot-clips applied. Per GI, tested (-) for H.Pylori and needs to avoid NSAIDS completely. Hgb today 8. Per primary.   AMS-She has not missed HD. BUN elevated on admission, improved post HD. Concerned elevation in BUN/Low HGB is reflective of GIB. Discussed with primary. Do not see sedating meds or excessive use of gabapentin on med list. Per primary. Now resolved. ESRD - on HD TTS today. Next HD 10/17/2022-she will resume at [facility] HTN/ volume - Hypertensive on admission, now BP on soft side Net UF 1.7. On Amlodipine-olmesartan as OP and currently on hold. Volume seems OK. Last wt if accurate at OP EDW 78 kg. UF as tolerated, optimize volume with HD. May need to raise EDW at outpatient-will review weights here to determine this. Anemia w/ h/o heavy recent menses; now possible GIB. Please see # 2.Give ESA with next HD. Continue Fe load.  MBD ckd - CCa in range; check  PO4 for binder  management. Cont po vdra and phoslo as binder for now. HIV  - per primary team. DM2 Recent PNA/Rib pain  Migraines - present since MI; Needs OP workup and alternate therapy to control pain, primary care team discussion this with patient now. Dispo - plan for discharge today. She will resume HD tomorrow at [facility].  Tobie Poet, NP [facility] 10/16/2022,10:19 AM  LOS: 3 days

## 2022-10-16 NOTE — Progress Notes (Signed)
Contacted by renal NP regarding pt's planned d/c for today. Contacted [facility] to advise clinic of pt's d/c today and that pt should resume care tomorrow.   Melven Sartorius Renal Navigator [phone]

## 2022-10-17 DIAGNOSIS — R519 Headache, unspecified: Secondary | ICD-10-CM | POA: Diagnosis not present

## 2022-10-17 DIAGNOSIS — D631 Anemia in chronic kidney disease: Secondary | ICD-10-CM | POA: Diagnosis not present

## 2022-10-17 DIAGNOSIS — Z992 Dependence on renal dialysis: Secondary | ICD-10-CM | POA: Diagnosis not present

## 2022-10-17 DIAGNOSIS — I120 Hypertensive chronic kidney disease with stage 5 chronic kidney disease or end stage renal disease: Secondary | ICD-10-CM | POA: Diagnosis not present

## 2022-10-17 DIAGNOSIS — N186 End stage renal disease: Secondary | ICD-10-CM | POA: Diagnosis not present

## 2022-10-17 DIAGNOSIS — N2581 Secondary hyperparathyroidism of renal origin: Secondary | ICD-10-CM | POA: Diagnosis not present

## 2022-10-18 ENCOUNTER — Encounter (HOSPITAL_COMMUNITY): Payer: Self-pay | Admitting: Gastroenterology

## 2022-10-18 NOTE — TOC Transition Note (Signed)
Transition of Care - Initial Contact from Inpatient Facility  Date of discharge: 10/16/22 Date of contact: 10/18/22  Method: Phone Spoke to: Patient  Patient contacted to discuss transition of care from recent inpatient hospitalization. Patient was admitted to [facility] from 10/12/22-10/16/22 with discharge diagnosis of upper GI bleed  The discharge medication list was reviewed. Patient understands the changes and has no concerns.   Patient will return to her outpatient HD unit on: Tuesday, March 12th at [facility]  No other concerns at this time.  Tobie Poet, NP

## 2022-10-19 ENCOUNTER — Ambulatory Visit (INDEPENDENT_AMBULATORY_CARE_PROVIDER_SITE_OTHER): Payer: 59 | Admitting: Student

## 2022-10-19 ENCOUNTER — Encounter: Payer: Self-pay | Admitting: Student

## 2022-10-19 ENCOUNTER — Other Ambulatory Visit: Payer: Self-pay

## 2022-10-19 ENCOUNTER — Telehealth: Payer: Self-pay

## 2022-10-19 ENCOUNTER — Ambulatory Visit (INDEPENDENT_AMBULATORY_CARE_PROVIDER_SITE_OTHER): Payer: 59

## 2022-10-19 VITALS — BP 141/56 | HR 84 | Temp 98.6°F | Ht 65.0 in | Wt 171.5 lb

## 2022-10-19 VITALS — BP 141/56 | HR 84 | Temp 98.6°F | Resp 24 | Ht 65.0 in

## 2022-10-19 DIAGNOSIS — I700 Atherosclerosis of aorta: Secondary | ICD-10-CM | POA: Diagnosis not present

## 2022-10-19 DIAGNOSIS — E669 Obesity, unspecified: Secondary | ICD-10-CM

## 2022-10-19 DIAGNOSIS — F419 Anxiety disorder, unspecified: Secondary | ICD-10-CM

## 2022-10-19 DIAGNOSIS — K250 Acute gastric ulcer with hemorrhage: Secondary | ICD-10-CM

## 2022-10-19 DIAGNOSIS — Z0000 Encounter for general adult medical examination without abnormal findings: Secondary | ICD-10-CM | POA: Diagnosis not present

## 2022-10-19 DIAGNOSIS — Z794 Long term (current) use of insulin: Secondary | ICD-10-CM | POA: Diagnosis not present

## 2022-10-19 DIAGNOSIS — Z6828 Body mass index (BMI) 28.0-28.9, adult: Secondary | ICD-10-CM

## 2022-10-19 DIAGNOSIS — T879 Unspecified complications of amputation stump: Secondary | ICD-10-CM

## 2022-10-19 DIAGNOSIS — K922 Gastrointestinal hemorrhage, unspecified: Secondary | ICD-10-CM | POA: Diagnosis not present

## 2022-10-19 DIAGNOSIS — E1169 Type 2 diabetes mellitus with other specified complication: Secondary | ICD-10-CM

## 2022-10-19 DIAGNOSIS — I10 Essential (primary) hypertension: Secondary | ICD-10-CM | POA: Diagnosis not present

## 2022-10-19 DIAGNOSIS — G43109 Migraine with aura, not intractable, without status migrainosus: Secondary | ICD-10-CM

## 2022-10-19 LAB — POCT GLYCOSYLATED HEMOGLOBIN (HGB A1C): Hemoglobin A1C: 6.5 % — AB (ref 4.0–5.6)

## 2022-10-19 LAB — GLUCOSE, CAPILLARY: Glucose-Capillary: 253 mg/dL — ABNORMAL HIGH (ref 70–99)

## 2022-10-19 MED ORDER — SERTRALINE HCL 25 MG PO TABS: 25.0000 mg | ORAL_TABLET | Freq: Every day | ORAL | 3 refills | Status: DC

## 2022-10-19 NOTE — Progress Notes (Signed)
I reviewed the AWV findings with the provider who conducted the visit. I was present in the office suite and immediately available to provide assistance and direction throughout the time the service was provided.  

## 2022-10-19 NOTE — Transitions of Care (Post Inpatient/ED Visit) (Signed)
   10/19/2022  Name: Kathy Frank MRN: [id number] DOB: 29-Dec-1975  Today's TOC FU Call Status: Today's TOC FU Call Status:: Unsuccessul Call (1st Attempt) Unsuccessful Call (1st Attempt) Date: 10/19/22  Attempted to reach the patient regarding the most recent Inpatient/ED visit.  Follow Up Plan: Additional outreach attempts will be made to reach the patient to complete the Transitions of Care (Post Inpatient/ED visit) call.   Johnney Killian, RN, BSN, CCM Care Management Coordinator [facility] Phone: [phone]: [phone]

## 2022-10-19 NOTE — Progress Notes (Signed)
Subjective:   Kathy Frank is a 47 y.o. female who presents for an Initial Medicare Annual Wellness Visit. I connected with  Kathy Frank on 10/19/22 by a  Face-To-Face encounter  and verified that I am speaking with the correct person using two identifiers.  Patient Location: Other:  Office/Clinic  Provider Location: Office/Clinic  I discussed the limitations of evaluation and management by telemedicine. The patient expressed understanding and agreed to proceed.  Review of Systems    Defer to PCP       Objective:    Today's Vitals   10/19/22 1506 10/19/22 1507  BP: (!) 142/65 (!) 141/56  Pulse: 86 84  Temp: 98.6 F (37 C)   TempSrc: Oral   SpO2: 100%   Weight: 171 lb 8.3 oz (77.8 kg)   Height: '5\' 5"'$  (1.651 m)   PainSc:  7    Body mass index is 28.54 kg/m.     10/19/2022    3:08 PM 10/19/2022   11:00 AM 10/12/2022    4:50 PM 10/03/2022    9:51 AM 09/29/2022    1:01 PM 06/27/2022    8:03 AM 06/25/2022    3:16 PM  Advanced Directives  Does Patient Have a Medical Advance Directive? No No No No No No No  Would patient like information on creating a medical advance directive? No - Patient declined No - Patient declined  No - Patient declined No - Patient declined No - Patient declined No - Patient declined    Current Medications (verified) Outpatient Encounter Medications as of 10/19/2022  Medication Sig   Accu-Chek Softclix Lancets lancets Use as instructed   albuterol (VENTOLIN HFA) 108 (90 Base) MCG/ACT inhaler Inhale 2 puffs into the lungs every 4 (four) hours as needed for wheezing or shortness of breath.   amLODipine (NORVASC) 10 MG tablet Take 1 tablet (10 mg total) by mouth daily.   atorvastatin (LIPITOR) 80 MG tablet Take 1 tablet (80 mg total) by mouth daily.   bictegravir-emtricitabine-tenofovir AF (BIKTARVY) 50-200-25 MG TABS tablet Take 1 tablet by mouth daily. OVERDUE FOR OFFICE FOLLOW UP - PLEASE CALL OUR OFFICE TO SCHEDULE [phone] (Patient taking  differently: Take 1 tablet by mouth daily.)   calcitRIOL (ROCALTROL) 0.25 MCG capsule Take 5 capsules (1.25 mcg total) by mouth Every Tuesday,Thursday,and Saturday with dialysis.   calcium acetate (PHOSLO) 667 MG capsule Take 2 capsules (1,334 mg total) by mouth 3 (three) times daily with meals.   Continuous Blood Gluc Receiver (DEXCOM G7 RECEIVER) DEVI Use to check blood sugars cntinuously   Continuous Blood Gluc Sensor (DEXCOM G7 SENSOR) MISC Use to check blood sugar continuously   Darbepoetin Alfa (ARANESP) 200 MCG/0.4ML SOSY injection Inject 0.4 mLs (200 mcg total) into the skin every Monday at 6 PM.   diclofenac Sodium (VOLTAREN) 1 % GEL Apply 4 g topically 4 (four) times daily.   EQ ASPIRIN ADULT LOW DOSE 81 MG tablet TAKE 1 TABLET BY MOUTH ONCE DAILY. SWALLOW WHOLE. (Patient taking differently: Take 81 mg by mouth daily.)   gabapentin (NEURONTIN) 100 MG capsule Take 100 mg by mouth at bedtime.   glucose blood test strip Use as instructed   insulin glargine (LANTUS SOLOSTAR) 100 UNIT/ML Solostar Pen Inject 15 Units into the skin at bedtime.   Insulin Pen Needle (PENTIPS) 32G X 4 MM MISC Use as directed   lidocaine (LIDODERM) 5 % Place 1 patch onto the skin daily. Remove & Discard patch within 12 hours or as directed  by MD   liraglutide (VICTOZA) 18 MG/3ML SOPN Inject 0.6 mg into the skin daily.   LOKELMA 5 g packet Take 5 g by mouth daily.   multivitamin (RENA-VIT) TABS tablet Take 1 tablet by mouth daily. (Patient taking differently: Take 1 tablet by mouth See admin instructions. On Tuesday, Wednesday and Thursday)   pantoprazole (PROTONIX) 40 MG tablet Take 1 tablet (40 mg total) by mouth 2 (two) times daily for 28 days, THEN 1 tablet (40 mg total) daily.   polyethylene glycol (MIRALAX / GLYCOLAX) 17 g packet Take 17 g by mouth daily. (Patient not taking: Reported on 10/16/2022)   sertraline (ZOLOFT) 25 MG tablet Take 1 tablet (25 mg total) by mouth daily.   topiramate (TOPAMAX) 25 MG tablet  Take 1 tablet (25 mg total) by mouth daily for 7 days, THEN 1 tablet (25 mg total) 2 (two) times daily for 7 days.   Ubrogepant (UBRELVY) 50 MG TABS Take 1 tablet (50 mg total) by mouth as needed (migraine headache). if needed a second dose may be taken at least 2 hours after the initial dose; MAX 8 doses in a month   No facility-administered encounter medications on file as of 10/19/2022.    Allergies (verified) Patient has no known allergies.   History: Past Medical History:  Diagnosis Date   Anal abscess    chronic   Anxiety    CAD (coronary artery disease)    CVA (cerebral vascular accident) ([place])    Depression 06/28/2006   Qualifier: Diagnosis of  By: Riccardo Dubin MD, Todd     Diabetes mellitus type 2 in obese ([place]) 06/28/1994   Dyspnea    uses oxygeb 2 liters per minute at dialysis   End stage renal disease on dialysis [facility])    on hemodialysis T/Th/Sat   Erosive esophagitis    Esophageal reflux    Eye redness    Gastroparesis    ? diabetic   GERD (gastroesophageal reflux disease)    History of blood transfusion 2019   Human immunodeficiency virus (HIV) disease ([place]) 04/23/2016   Hyperlipidemia    Hypertension    Metabolic bone disease [id number]   Moderate nonproliferative diabetic retinopathy of both eyes ([place]) 11/21/2014   11/14/14: Noted on retinal imaging; needs follow-up imaging in 6 months  05/22/16: Noted on retinal imaging again; needs follow-up imaging in 6 months   PAD (peripheral artery disease) ([place])    Type 2 diabetes mellitus with diabetic peripheral angiopathy without gangrene ([place]) 05/01/2019   Wears dentures    lower   Wound infection s/p L transmetatarsal amputation    Past Surgical History:  Procedure Laterality Date   ABDOMINAL AORTOGRAM W/LOWER EXTREMITY N/A 12/13/2020   Procedure: ABDOMINAL AORTOGRAM W/LOWER EXTREMITY;  Surgeon: Angelia Mould, MD;  Location: [place] [facility];  Service: Cardiovascular;  Laterality: N/A;   AMPUTATION Left  07/08/2018   Procedure: AMPUTATION FORTH RAY LEFT FOOT;  Surgeon: [name], MD;  Location: [place];  Service: Orthopedics;  Laterality: Left;   AMPUTATION Left 08/09/2018   Procedure: Left Transmetatarsal Amputation;  Surgeon: [name], MD;  Location: [place];  Service: Orthopedics;  Laterality: Left;   AMPUTATION Left 10/08/2018   Procedure: LEFT BELOW KNEE AMPUTATION;  Surgeon: [name], MD;  Location: [place];  Service: Orthopedics;  Laterality: Left;   AMPUTATION Left 10/28/2018   Procedure: REVISION BELOW KNEE AMPUTATION;  Surgeon: [name], MD;  Location: [place];  Service: Orthopedics;  Laterality: Left;  AMPUTATION Left 12/16/2018   Procedure: LEFT ABOVE KNEE AMPUTATION;  Surgeon: [name], MD;  Location: [place];  Service: Orthopedics;  Laterality: Left;   AMPUTATION Right 01/31/2021   Procedure: AMPUTATION BELOW KNEE;  Surgeon: Serafina Mitchell, MD;  Location: [facility];  Service: Vascular;  Laterality: Right;   AMPUTATION Right 03/28/2021   Procedure: REVISION OF RIGHT BELOW KNEE AMPUTATION;  Surgeon: Serafina Mitchell, MD;  Location: [place];  Service: Vascular;  Laterality: Right;   AMPUTATION Right 04/18/2021   Procedure: right below knee amputation/washout placement wound vac;  Surgeon: Cherre Robins, MD;  Location: [place];  Service: Vascular;  Laterality: Right;   AV FISTULA PLACEMENT Left 10/14/2018   Procedure: Arteriovenous (Av) Fistula Creation Left Arm;  Surgeon: [name], MD;  Location: [place];  Service: Vascular;  Laterality: Left;   [place] Left 04/14/2019   Procedure: BASILIC VEIN TRANSPOSITION SECOND STAGE LEFT ARM;  Surgeon: Rosetta Posner, MD;  Location: [place];  Service: Vascular;  Laterality: Left;   BIOPSY  10/14/2022   Procedure: BIOPSY;  Surgeon: Yetta Flock, MD;  Location: [place];  Service: Gastroenterology;;   CORONARY STENT INTERVENTION N/A 05/21/2021   Procedure: CORONARY STENT INTERVENTION;  Surgeon: Leonie Man, MD;  Location: [place] [facility];  Service: Cardiovascular;  Laterality: N/A;   ESOPHAGOGASTRODUODENOSCOPY (EGD) WITH PROPOFOL N/A 10/14/2022   Procedure: ESOPHAGOGASTRODUODENOSCOPY (EGD) WITH PROPOFOL;  Surgeon: Yetta Flock, MD;  Location: [place];  Service: Gastroenterology;  Laterality: N/A;   HEMOSTASIS CLIP PLACEMENT  10/14/2022   Procedure: HEMOSTASIS CLIP PLACEMENT;  Surgeon: Yetta Flock, MD;  Location: [place] [facility];  Service: Gastroenterology;;   INCISION AND DRAINAGE ABSCESS N/A 10/24/2020   Procedure: exicision of hydradenitis;  Surgeon: Leighton Ruff, MD;  [facility];  Service: General;  Laterality: N/A;  45 min   INSERTION OF DIALYSIS CATHETER Right 04/14/2019   Procedure: INSERTION OF DIALYSIS CATHETER;  Surgeon: Rosetta Posner, MD;  [facility]: [facility];  Service: Vascular;  Laterality: Right;   IR FLUORO GUIDE CV LINE RIGHT  10/13/2022   IR REMOVAL TUN CV CATH W/O FL  10/16/2022   IR US GUIDE VASC ACCESS RIGHT  10/13/2022   LEFT HEART CATH AND CORONARY ANGIOGRAPHY N/A 05/21/2021   Procedure: LEFT HEART CATH AND CORONARY ANGIOGRAPHY;  Surgeon: Leonie Man, MD;  Location: [place] [facility];  Service: Cardiovascular;  Laterality: N/A;   LOWER EXTREMITY ANGIOGRAPHY N/A 07/05/2018   Procedure: LOWER EXTREMITY ANGIOGRAPHY;  Surgeon: Serafina Mitchell, MD;  Location: [place] [facility];  Service: Cardiovascular;  Laterality: N/A;   PERIPHERAL VASCULAR ATHERECTOMY Right 12/13/2020   Procedure: PERIPHERAL VASCULAR ATHERECTOMY;  Surgeon: Angelia Mould, MD;  Location: [place] [facility];  Service: Cardiovascular;  Laterality: Right;  Superficial femoral   PERIPHERAL VASCULAR BALLOON ANGIOPLASTY Left 07/05/2018   Procedure: PERIPHERAL VASCULAR BALLOON ANGIOPLASTY;  Surgeon: Serafina Mitchell, MD;  Location: [place] [facility];  Service: Cardiovascular;  Laterality: Left;  SFA   PERIPHERAL VASCULAR BALLOON ANGIOPLASTY Right 12/13/2020    Procedure: PERIPHERAL VASCULAR BALLOON ANGIOPLASTY;  Surgeon: Angelia Mould, MD;  Location: [place] [facility];  Service: Cardiovascular;  Laterality: Right;  Peroneal artery, anterior tibial artery   RECTAL EXAM UNDER ANESTHESIA N/A 10/24/2020   Procedure: EXAM UNDER ANESTHESIA;  Surgeon: Leighton Ruff, MD;  Location: [facility];  Service: General;  Laterality: N/A;   STUMP REVISION Left 10/19/2018  Procedure: REVISION LEFT BELOW KNEE AMPUTATION;  Surgeon: [name], MD;  Location: [place];  Service: Orthopedics;  Laterality: Left;   TUBAL LIGATION  2002   Family History  Problem Relation Age of Onset   Diabetes Mother    Diabetes Brother    Diabetes Daughter    Diabetes Daughter    Mental retardation Brother        died from PNA   Diabetes Maternal Grandmother    Social History   Socioeconomic History   Marital status: Single    Spouse name: Not on file   Number of children: Not on file   Years of education: 10   Highest education level: Not on file  Occupational History    Employer: UNEMPLOYED  Tobacco Use   Smoking status: Former    Packs/day: 0.10    Years: 10.00    Total pack years: 1.00    Types: Cigarettes    Start date: 11/17/2013    Quit date: 09/09/2014    Years since quitting: 8.1   Smokeless tobacco: Never  Vaping Use   Vaping Use: Never used  Substance and Sexual Activity   Alcohol use: Not Currently   Drug use: Yes    Types: Marijuana    Comment: 2 times a monthy -  `last time [place] 2022   Sexual activity: Not Currently    Partners: Female, Female  Other Topics Concern   Not on file  Social History Narrative   Not on file   Social Determinants of Health   Financial Resource Strain: Low Risk  (10/19/2022)   Overall Financial Resource Strain (CARDIA)    Difficulty of Paying Living Expenses: Not hard at all  Food Insecurity: No Food Insecurity (10/19/2022)   Hunger Vital Sign    Worried About Running Out of Food in the Last  Year: Never true    Ran Out of Food in the Last Year: Never true  Transportation Needs: Unmet Transportation Needs (10/19/2022)   PRAPARE - Hydrologist (Medical): Yes    Lack of Transportation (Non-Medical): No  Physical Activity: Inactive (10/19/2022)   Exercise Vital Sign    Days of Exercise per Week: 0 days    Minutes of Exercise per Session: 0 min  Stress: Stress Concern Present (10/19/2022)   [place]    Feeling of Stress : Very much  Social Connections: Socially Isolated (10/19/2022)   Social Connection and Isolation Panel [NHANES]    Frequency of Communication with Friends and Family: More than three times a week    Frequency of Social Gatherings with Friends and Family: Never    Attends Religious Services: Never    Marine scientist or Organizations: No    Attends Music therapist: Never    Marital Status: Never married    Tobacco Counseling Counseling given: Not Answered   Clinical Intake:  Pre-visit preparation completed: No  Pain : 0-10 Pain Score: 7  Pain Type: Chronic pain Pain Location: Leg Pain Orientation: Right Pain Descriptors / Indicators: Aching Pain Onset: 1 to 4 weeks ago Pain Frequency: Constant Pain Relieving Factors: tylenol Effect of Pain on Daily Activities: pain during any movement  Pain Relieving Factors: tylenol  Nutritional Risks: None Diabetes: Yes CBG done?: Yes CBG resulted in Enter/ Edit results?: Yes Did pt. bring in CBG monitor from home?: No  How often do you need to have someone help you when you  read instructions, pamphlets, or other written materials from your doctor or pharmacy?: 1 - Never What is the last grade level you completed in school?: 10th grade  Diabetic?Nutrition Risk Assessment:  Has the patient had any N/V/D within the last 2 months?  No  Does the patient have any non-healing wounds?  No  Has the  patient had any unintentional weight loss or weight gain?  No   Diabetes:  Is the patient diabetic?  Yes  If diabetic, was a CBG obtained today?  Yes  Did the patient bring in their glucometer from home?  No  How often do you monitor your CBG's? Every 3 months  Financial Strains and Diabetes Management:  Are you having any financial strains with the device, your supplies or your medication? No .  Does the patient want to be seen by Chronic Care Management for management of their diabetes?  No  Would the patient like to be referred to a Nutritionist or for Diabetic Management?  No   Diabetic Exams:  Diabetic Eye Exam: Overdue for diabetic eye exam. Pt has been advised about the importance in completing this exam. Patient advised to call and schedule an eye exam. Diabetic Foot Exam: Overdue, Pt has been advised about the importance in completing this exam. Pt is scheduled for diabetic foot exam on N/A.   Interpreter Needed?: No  Information entered by :: Kathy Frank,cma   Activities of Daily Living    10/19/2022    3:09 PM 10/19/2022   10:58 AM  In your present state of health, do you have any difficulty performing the following activities:  Hearing? 0 0  Vision? 0 0  Difficulty concentrating or making decisions? 0 0  Walking or climbing stairs? 1 1  Comment  Double amputee  Dressing or bathing? 0 0  Doing errands, shopping? 1 1    Patient Care Team: [name], MD as PCP - General Berniece Salines, DO as PCP - Cardiology (Cardiology) Clent Jacks, MD as Consulting Physician (Ophthalmology) [place] any recent Medical Services you may have received from other than [facility] providers in the past year (date may be approximate).     Assessment:   This is a routine wellness examination for Kathy Frank.  Hearing/Vision screen No results found.  Dietary issues and exercise activities discussed:     Goals Addressed   None   Depression Screen     10/19/2022    3:09 PM 10/19/2022   12:10 PM 08/26/2022   10:48 AM 06/25/2022    3:18 PM 10/27/2021    4:04 PM 05/02/2021   11:42 AM 05/10/2020    4:37 PM  PHQ 2/9 Scores  PHQ - 2 Score '2 2 1 6 1 6 2  '$ PHQ- 9 Score '23 11  17  22 8    '$ Fall Risk    10/19/2022    3:08 PM 10/19/2022   10:58 AM 08/26/2022   10:48 AM 06/25/2022    3:11 PM 10/27/2021    4:04 PM  Fall Risk   Falls in the past year? 0 0 0 0 0  Number falls in past yr: 0 0 0 0   Injury with Fall? 0 0 0 0   Risk for fall due to : No Fall Risks Impaired mobility Impaired mobility Impaired mobility Impaired balance/gait  Risk for fall due to: Comment    Double leg Amputee   Follow up Falls evaluation completed;Falls prevention discussed Falls evaluation completed;Falls prevention discussed Falls evaluation  completed Falls evaluation completed;Falls prevention discussed Falls evaluation completed    FALL RISK PREVENTION PERTAINING TO THE HOME:  Any stairs in or around the home? No  If so, are there any without handrails? No  Home free of loose throw rugs in walkways, pet beds, electrical cords, etc? Yes  Adequate lighting in your home to reduce risk of falls? Yes   ASSISTIVE DEVICES UTILIZED TO PREVENT FALLS:  Life alert? No  Use of a cane, walker or w/c? Yes  Grab bars in the bathroom? No  Shower chair or bench in shower? Yes  Elevated toilet seat or a handicapped toilet? Yes   TIMED UP AND GO:  Was the test performed? No .  Length of time to ambulate 10 feet: 0 sec.   Gait slow and steady with assistive device  Cognitive Function:        10/19/2022    3:09 PM  6CIT Screen  What Year? 0 points  What month? 0 points  What time? 0 points  Count back from 20 0 points  Months in reverse 0 points  Repeat phrase 0 points  Total Score 0 points    Immunizations Immunization History  Administered Date(s) Administered   Hepatitis B, ADULT 05/27/2016, 07/08/2016, 05/25/2019, 06/22/2019, 07/20/2019, 11/23/2019    Influenza, Quadrivalent, Recombinant, Inj, Pf 05/06/2019   Influenza,inj,Quad PF,6+ Mos 04/21/2016, 05/17/2017, 10/09/2018, 05/06/2019, 05/29/2020, 04/22/2021   Meningococcal Mcv4o 07/08/2016, 10/07/2016   Moderna Sars-Covid-2 Vaccination 10/31/2019   Pneumococcal Conjugate-13 01/05/2018   Pneumococcal Polysaccharide-23 11/16/2013   Tdap 09/17/2017    TDAP status: Up to date  Flu Vaccine status: Up to date  Pneumococcal vaccine status: Due, Education has been provided regarding the importance of this vaccine. Advised may receive this vaccine at local pharmacy or Health Dept. Aware to provide a copy of the vaccination record if obtained from local pharmacy or Health Dept. Verbalized acceptance and understanding.  Covid-19 vaccine status: Completed vaccines  Qualifies for Shingles Vaccine? No   Zostavax completed No   Shingrix Completed?: No.    Education has been provided regarding the importance of this vaccine. Patient has been advised to call insurance company to determine out of pocket expense if they have not yet received this vaccine. Advised may also receive vaccine at local pharmacy or Health Dept. Verbalized acceptance and understanding.  Screening Tests Health Maintenance  Topic Date Due   OPHTHALMOLOGY EXAM  05/22/2017   COVID-19 Vaccine (2 - Moderna risk series) 11/28/2019   PAP SMEAR-Modifier  02/04/2021   FOOT EXAM  05/13/2021   COLONOSCOPY (Pts 45-34yr Insurance coverage will need to be confirmed)  Never done   LIPID PANEL  05/22/2022   HEMOGLOBIN A1C  01/19/2023   Medicare Annual Wellness (AWV)  10/19/2023   DTaP/Tdap/Td (2 - Td or Tdap) 09/18/2027   INFLUENZA VACCINE  Completed   Hepatitis C Screening  Completed   HIV Screening  Completed   HPV VACCINES  Aged Out    Health Maintenance  Health Maintenance Due  Topic Date Due   OPHTHALMOLOGY EXAM  05/22/2017   COVID-19 Vaccine (2 - Moderna risk series) 11/28/2019   PAP SMEAR-Modifier  02/04/2021   FOOT  EXAM  05/13/2021   COLONOSCOPY (Pts 45-414yrInsurance coverage will need to be confirmed)  Never done   LIPID PANEL  05/22/2022       Lung Cancer Screening: (Low Dose CT Chest recommended if Age 46-80ears, 30 pack-year currently smoking OR have quit w/in 15years.) does not qualify.  Lung Cancer Screening Referral: N/A  Additional Screening:  Hepatitis C Screening: does not qualify; Completed 04/23/2016  Vision Screening: Recommended annual ophthalmology exams for early detection of glaucoma and other disorders of the eye. Is the patient up to date with their annual eye exam?  Yes  Who is the provider or what is the name of the office in which the patient attends annual eye exams? N/A If pt is not established with a provider, would they like to be referred to a provider to establish care? No .   Dental Screening: Recommended annual dental exams for proper oral hygiene  Community Resource Referral / Chronic Care Management: CRR required this visit?  No   CCM required this visit?  No      Plan:     I have personally reviewed and noted the following in the patient's chart:   Medical and social history Use of alcohol, tobacco or illicit drugs  Current medications and supplements including opioid prescriptions. Patient is not currently taking opioid prescriptions. Functional ability and status Nutritional status Physical activity Advanced directives List of other physicians Hospitalizations, surgeries, and ER visits in previous 12 months Vitals Screenings to include cognitive, depression, and falls Referrals and appointments  In addition, I have reviewed and discussed with patient certain preventive protocols, quality metrics, and best practice recommendations. A written personalized care plan for preventive services as well as general preventive health recommendations were provided to patient.     Kerin Perna, [facility]   10/19/2022   Nurse Notes: Face-To-Face  Visit  Kathy Frank , Thank you for taking time to come for your Medicare Wellness Visit. I appreciate your ongoing commitment to your health goals. Please review the following plan we discussed and let me know if I can assist you in the future.   These are the goals we discussed:  Goals      Blood Pressure < 140/90     HEMOGLOBIN A1C < 7.0        This is a list of the screening recommended for you and due dates:  Health Maintenance  Topic Date Due   Eye exam for diabetics  05/22/2017   COVID-19 Vaccine (2 - Moderna risk series) 11/28/2019   Pap Smear  02/04/2021   Complete foot exam   05/13/2021   Colon Cancer Screening  Never done   Lipid (cholesterol) test  05/22/2022   Hemoglobin A1C  01/19/2023   Medicare Annual Wellness Visit  10/19/2023   DTaP/Tdap/Td vaccine (2 - Td or Tdap) 09/18/2027   Flu Shot  Completed   Hepatitis C Screening: USPSTF Recommendation to screen - Ages 18-79 yo.  Completed   HIV Screening  Completed   HPV Vaccine  Aged Out

## 2022-10-19 NOTE — Assessment & Plan Note (Addendum)
Olmesartan held at last hospitalization due to low blood pressures in setting of upper GI bleed.  Is taking amlodipine 10 mg daily.  BP today 141/56 and 142/65.  Reports her dry weights are being adjusted at dialysis.  Given her low diastolic blood pressures in changes in her dialysis we will hold off on adjusting her blood pressure medications . Continue amlodipine 10 mg daily

## 2022-10-19 NOTE — Assessment & Plan Note (Signed)
Reports pain in RLE. Pain is generalized and sharp. States this has been ongoing since prior to revision and is chronic. Indication appears well healed, no skin breakdown. R Popliteal pulse present. Diffuse pain over distal stump on palpation.  States she was on dilaudid for this during admission. Tylenol has not helped in the past. Is taking ASA 81 mg daily and statin. Limited medical options with her ESRD and episodes of encephalopathy. Will have her follow up with vascular to assess worsening PAD.

## 2022-10-19 NOTE — Addendum Note (Signed)
Addended by: [name] on: 10/19/2022 05:03 PM   Modules accepted: Level of Service

## 2022-10-19 NOTE — Assessment & Plan Note (Signed)
A1c 6.5% today may be falsely low in the setting of her ESRD.  She is on Lantus 15 units daily and Victoza 0.6 mg daily.  She needs to make an appointment for Dexcom placement with Butch Penny which she will do after her appointment today. Will continue on her current medications and follow-up with her CGM results.

## 2022-10-19 NOTE — Assessment & Plan Note (Deleted)
Admitted last week in the setting of upper GI bleed.  Was taking BC powder regularly for migraines.  She has since stopped this and is on PPI twice daily. EGD performed during admission with nonbleeding gastric ulcers with adherent clot and reflux esophagitis.  Surgical pathology negative for H. Pylori. he denies any signs of further melena or GI bleeding.  Not having any abdominal pain.  Check CBC and iron studies Continue PPI twice daily for total 4 weeks then daily Has follow-up with Dr. Henrene Pastor in May

## 2022-10-19 NOTE — Patient Instructions (Addendum)
It was a pleasure seeing you in clinic today   Please start sertraline 25 mg daily for anxiety in addition to seeing counseling  Migraines Try ubrelvy 1 tablet as needed to stop migraines, you can take an additional dose 2 hours after if you still have pain, do not take more than 8 doses in a month  GI bleeding Please follow up with Dr. Henrene Pastor  Avoid over the counter NSAIDs such as BC powder, ibuprofen etc Continue PPI and sucralfate  I will check you blood levels and call with the results  DM  Please make a follow up visit with Butch Penny for Dexcom  Leg pain Please follow up with vascular surgery to discuss leg pain  Follow up in 1 month

## 2022-10-19 NOTE — Assessment & Plan Note (Signed)
Significant anxiety symptoms and difficulty sleeping.  GAD-7 of 19 today.  Has not been on medication for this in the past.  Is seeing behavioral health through [facility].  She is interested in pharmacological therapy.  Her ESRD and topiramate for migraine prophylaxis does put her at risk for serotonin syndrome.  Kathy Frank will start low-dose sertraline at 25 mg daily and follow-up in 1 month.  If topiramate is not improving her migraines with likely switch her to alternative migraine prophylaxis in order to maximize therapies for anxiety.

## 2022-10-19 NOTE — Assessment & Plan Note (Signed)
Admitted last week in the setting of upper GI bleed due to gastric ulcers.  Was taking BC powder regularly for migraines.  She has since stopped this and is on PPI twice daily. EGD performed during admission with nonbleeding gastric ulcers with adherent clot and reflux esophagitis.  Surgical pathology negative for H. Pylori. he denies any signs of further melena or GI bleeding.  Not having any abdominal pain.  Check CBC and iron studies Continue PPI twice daily for total 4 weeks then daily Has follow-up with Dr. Henrene Pastor in May

## 2022-10-19 NOTE — Assessment & Plan Note (Signed)
Patient with history of migraines and was taking significant amounts of BC powder leading to upper GI bleeding.  Was started on topiramate 25 mg daily and Ubrelvy at most recent hospitalization last week to improve this.  She estimates having between 12-16 migraine days monthly.  Unable to take triptans in the setting of history of prior ischemic stroke.  She has not tried [place] since her discharge.  Reviewed instructions for taking Roselyn Meier and discussed increasing her topiramate to 25 mg twice daily after 1 week.  -Topiramate 25 mg daily for 1 week then increase to 25 mg twice daily -Ubrelvy as needed for migraines do not exceed more than 8 doses a month

## 2022-10-19 NOTE — Progress Notes (Signed)
Established Patient Office Visit  Subjective   Patient ID: Kathy Frank, female    DOB: Oct 15, 1975  Age: 47 y.o. MRN: [id number]  Chief Complaint  Patient presents with   Medication Refill   Follow-up    HFU    Kathy Frank is a 47 y.o. person living with a history listed below who presents to clinic for hospital follow up. She was admitted to [facility] between 10/12/2022 and 10/16/2022 for upper GI bleed secondary to gastric ulcers in the setting of NSAID use.  She received2 units of blood and had EGD performed with nonbleeding gastric ulcers and adherent clot which was clipped.  She is now on PPI and feeling better. Please refer to problem based charting for further details and assessment and plan of current problem and chronic medical conditions.     Patient Active Problem List   Diagnosis Date Noted   Anemia, posthemorrhagic, acute 10/14/2022   High anion gap metabolic acidosis [id number]   Acute gastric ulcer with hemorrhage 10/13/2022   Hyperkalemia 10/12/2022   Right-sided chest pain 10/01/2022   CAP (community acquired pneumonia) 09/29/2022   Homelessness 08/26/2022   Migraine with aura 06/29/2022   Community acquired pneumonia 06/25/2022   Hypertensive emergency 11/09/2021   Requires assistance with activities of daily living (ADL) 10/27/2021   Analgesic overuse headache 06/28/2021   Urinary and fecal incontinence 05/30/2021   Epistaxis 05/27/2021   Status post coronary artery stent placement    Coronary artery disease involving native coronary artery of native heart with unstable angina pectoris ([place])    Hypertensive urgency 05/20/2021   History of non-ST elevation myocardial infarction (NSTEMI) 05/20/2021   BKA stump complication ([place]) [id number]   Right below-knee amputee ([place]) 02/08/2021   Peripheral arterial disease ([place]) 01/29/2021   Anxiety 05/28/2020   Patient's other noncompliance with medication regimen 12/28/2019   Major depressive disorder,  single episode, unspecified 04/24/2019   Secondary hyperparathyroidism of renal origin ([place]) 04/24/2019   S/P AKA (above knee amputation), left ([place]) 12/16/2018   Anemia due to chronic kidney disease    Moderate protein-calorie malnutrition (HCC)    Adjustment disorder with mixed anxiety and depressed mood 08/15/2018   ESRD on dialysis ([place])    Fecal incontinence 02/04/2018   Aortic atherosclerosis ([place]) 09/30/2016   Human immunodeficiency virus (HIV) disease ([place]) 04/23/2016   Gastroparesis    Moderate nonproliferative diabetic retinopathy of both eyes ([place]) 11/21/2014   Esophageal reflux    Essential hypertension 11/16/2013   Anemia 11/15/2013   Depression 06/28/2006   Diabetes mellitus type 2 in obese ([place]) 06/28/1994      Review of Systems  Constitutional:  Negative for chills and fever.  Respiratory:  Negative for sputum production, shortness of breath and wheezing.   Cardiovascular:  Negative for chest pain.  Gastrointestinal:  Negative for abdominal pain, blood in stool, constipation, diarrhea, melena and vomiting.  Musculoskeletal:        Right leg pain  All other systems reviewed and are negative.     Objective:     BP (!) 141/56 (BP Location: Right Arm, Patient Position: Sitting, Cuff Size: Normal)   Pulse 84   Temp 98.6 F (37 C) (Oral)   Resp (!) 24   Ht '5\' 5"'$  (1.651 m)   LMP  (LMP Unknown)   SpO2 100% Comment: room air  BMI 28.54 kg/m  BP Readings from Last 3 Encounters:  10/19/22 (!) 141/56  10/16/22 (!) 168/71  10/03/22 (!) 163/89  Physical Exam Constitutional:      Comments: Chronically ill appearing, in wheelchair  HENT:     Mouth/Throat:     Mouth: Mucous membranes are moist.     Pharynx: Oropharynx is clear.  Cardiovascular:     Rate and Rhythm: Normal rate and regular rhythm.     Heart sounds: Murmur (systolic) heard.     Comments: No JVD Pulmonary:     Effort: Pulmonary effort is normal.     Breath sounds: No rhonchi or rales.   Abdominal:     General: Abdomen is flat. Bowel sounds are normal. There is no distension.     Palpations: Abdomen is soft.     Tenderness: There is no abdominal tenderness.  Musculoskeletal:        General: Normal range of motion.     Comments: S/p R BKA and left AKA  Skin:    General: Skin is warm and dry.     Capillary Refill: Capillary refill takes less than 2 seconds.     Findings: No bruising or lesion.  Neurological:     General: No focal deficit present.     Mental Status: She is alert and oriented to person, place, and time.  Psychiatric:        Mood and Affect: Mood normal.        Behavior: Behavior normal.      Results for orders placed or performed in visit on 10/19/22  Glucose, capillary  Result Value Ref Range   Glucose-Capillary 253 (H) 70 - 99 mg/dL  POC Hbg A1C  Result Value Ref Range   Hemoglobin A1C 6.5 (A) 4.0 - 5.6 %   HbA1c POC (<> result, manual entry)     HbA1c, POC (prediabetic range)     HbA1c, POC (controlled diabetic range)      Last CBC Lab Results  Component Value Date   WBC 9.1 10/16/2022   HGB 8.0 (L) 10/16/2022   HCT 24.1 (L) 10/16/2022   MCV 91.6 10/16/2022   MCH 30.4 10/16/2022   RDW 18.7 (H) 10/16/2022   PLT 242 [id number]   Last metabolic panel Lab Results  Component Value Date   GLUCOSE 84 10/16/2022   NA 137 10/16/2022   K 3.6 10/16/2022   CL 93 (L) 10/16/2022   CO2 28 10/16/2022   BUN 42 (H) 10/16/2022   CREATININE 6.10 (H) 10/16/2022   GFRNONAA 8 (L) 10/16/2022   CALCIUM 8.4 (L) 10/16/2022   PHOS 4.5 10/16/2022   PROT 7.2 10/14/2022   ALBUMIN 2.6 (L) 10/16/2022   LABGLOB 3.9 04/23/2016   AGRATIO 0.9 (L) 04/23/2016   BILITOT <0.1 (L) 10/14/2022   ALKPHOS 58 10/14/2022   AST 92 (H) 10/14/2022   ALT 75 (H) 10/14/2022   ANIONGAP 16 (H) 10/16/2022   Last lipids Lab Results  Component Value Date   CHOL 177 05/22/2021   HDL 38 (L) 05/22/2021   LDLCALC 96 05/22/2021   TRIG 214 (H) 05/22/2021   CHOLHDL 4.7  05/22/2021   Last hemoglobin A1c Lab Results  Component Value Date   HGBA1C 6.5 (A) 10/19/2022      The ASCVD Risk score (Arnett DK, et al., 2019) failed to calculate for the following reasons:   The patient has a prior MI or stroke diagnosis    Assessment & Plan:   Problem List Items Addressed This Visit       Cardiovascular and Mediastinum   Aortic atherosclerosis ([place]) (Chronic)   Relevant Orders  Lipid Profile   Essential hypertension    Olmesartan held at last hospitalization due to low blood pressures in setting of upper GI bleed.  Is taking amlodipine 10 mg daily.  BP today 141/56 and 142/65.  Reports her dry weights are being adjusted at dialysis.  Given her low diastolic blood pressures in changes in her dialysis we will hold off on adjusting her blood pressure medications . Continue amlodipine 10 mg daily      Migraine with aura    Patient with history of migraines and was taking significant amounts of BC powder leading to upper GI bleeding.  Was started on topiramate 25 mg daily and Ubrelvy at most recent hospitalization last week to improve this.  She estimates having between 12-16 migraine days monthly.  Unable to take triptans in the setting of history of prior ischemic stroke.  She has not tried [place] since her discharge.  Reviewed instructions for taking Roselyn Meier and discussed increasing her topiramate to 25 mg twice daily after 1 week.  -Topiramate 25 mg daily for 1 week then increase to 25 mg twice daily -Ubrelvy as needed for migraines do not exceed more than 8 doses a month       Relevant Medications   sertraline (ZOLOFT) 25 MG tablet     Digestive   Upper GI bleed    Admitted last week in the setting of upper GI bleed.  Was taking BC powder regularly for migraines.  She has since stopped this and is on PPI twice daily. EGD performed during admission with nonbleeding gastric ulcers with adherent clot and reflux esophagitis.  Surgical pathology negative  for H. Pylori. he denies any signs of further melena or GI bleeding.  Not having any abdominal pain.  Check CBC and iron studies Continue PPI twice daily for total 4 weeks then daily Has follow-up with Dr. Henrene Pastor in May      Relevant Orders   CBC no Diff   Iron, TIBC and Ferritin Panel     Endocrine   Diabetes mellitus type 2 in obese (HCC) - Primary (Chronic)    A1c 6.5% today may be falsely low in the setting of her ESRD.  She is on Lantus 15 units daily and Victoza 0.6 mg daily.  She needs to make an appointment for Dexcom placement with Butch Penny which she will do after her appointment today. Will continue on her current medications and follow-up with her CGM results.      Relevant Orders   POC Hbg A1C (Completed)     Other   Anxiety    Significant anxiety symptoms and difficulty sleeping.  GAD-7 of 19 today.  Has not been on medication for this in the past.  Is seeing behavioral health through [facility].  She is interested in pharmacological therapy.  Her ESRD and topiramate for migraine prophylaxis does put her at risk for serotonin syndrome.  Gwyndolyn Saxon will start low-dose sertraline at 25 mg daily and follow-up in 1 month.  If topiramate is not improving her migraines with likely switch her to alternative migraine prophylaxis in order to maximize therapies for anxiety.      Relevant Medications   sertraline (ZOLOFT) 25 MG tablet    Return in about 4 weeks (around 11/16/2022).    [name], MD

## 2022-10-20 ENCOUNTER — Other Ambulatory Visit: Payer: Self-pay | Admitting: Student

## 2022-10-20 ENCOUNTER — Telehealth: Payer: Self-pay | Admitting: *Deleted

## 2022-10-20 ENCOUNTER — Other Ambulatory Visit (HOSPITAL_COMMUNITY): Payer: Self-pay

## 2022-10-20 ENCOUNTER — Telehealth: Payer: Self-pay

## 2022-10-20 DIAGNOSIS — R519 Headache, unspecified: Secondary | ICD-10-CM | POA: Diagnosis not present

## 2022-10-20 DIAGNOSIS — Z992 Dependence on renal dialysis: Secondary | ICD-10-CM | POA: Diagnosis not present

## 2022-10-20 DIAGNOSIS — I120 Hypertensive chronic kidney disease with stage 5 chronic kidney disease or end stage renal disease: Secondary | ICD-10-CM | POA: Diagnosis not present

## 2022-10-20 DIAGNOSIS — D631 Anemia in chronic kidney disease: Secondary | ICD-10-CM | POA: Diagnosis not present

## 2022-10-20 DIAGNOSIS — N186 End stage renal disease: Secondary | ICD-10-CM | POA: Diagnosis not present

## 2022-10-20 DIAGNOSIS — N2581 Secondary hyperparathyroidism of renal origin: Secondary | ICD-10-CM | POA: Diagnosis not present

## 2022-10-20 LAB — CBC
Hematocrit: 28.6 % — ABNORMAL LOW (ref 34.0–46.6)
Hemoglobin: 9.2 g/dL — ABNORMAL LOW (ref 11.1–15.9)
MCH: 29.4 pg (ref 26.6–33.0)
MCHC: 32.2 g/dL (ref 31.5–35.7)
MCV: 91 fL (ref 79–97)
NRBC: 1 % — ABNORMAL HIGH (ref 0–0)
Platelets: 343 10*3/uL (ref 150–450)
RBC: 3.13 x10E6/uL — ABNORMAL LOW (ref 3.77–5.28)
RDW: 16 % — ABNORMAL HIGH (ref 11.7–15.4)
WBC: 9.2 10*3/uL (ref 3.4–10.8)

## 2022-10-20 LAB — LIPID PANEL
Chol/HDL Ratio: 2.3 ratio (ref 0.0–4.4)
Cholesterol, Total: 106 mg/dL (ref 100–199)
HDL: 47 mg/dL (ref >39)
LDL Chol Calc (NIH): 38 mg/dL (ref 0–99)
Triglycerides: 120 mg/dL (ref 0–149)
VLDL Cholesterol Cal: 21 mg/dL (ref 5–40)

## 2022-10-20 LAB — IRON,TIBC AND FERRITIN PANEL
Ferritin: 1341 ng/mL — ABNORMAL HIGH (ref 15–150)
Iron Saturation: 19 % (ref 15–55)
Iron: 43 ug/dL (ref 27–159)
Total Iron Binding Capacity: 222 ug/dL — ABNORMAL LOW (ref 250–450)
UIBC: 179 ug/dL (ref 131–425)

## 2022-10-20 MED ORDER — UBRELVY 50 MG PO TABS: 50.0000 mg | ORAL_TABLET | ORAL | 3 refills | Status: DC | PRN

## 2022-10-20 NOTE — Transitions of Care (Post Inpatient/ED Visit) (Signed)
   10/20/2022  Name: SERRINA MINOGUE MRN: [id number] DOB: October 26, 1975  Today's TOC FU Call Status: Today's TOC FU Call Status:: Unsuccessful Call (2nd Attempt) Unsuccessful Call (2nd Attempt) Date: 10/20/22  Attempted to reach the patient regarding the most recent Inpatient/ED visit.  Follow Up Plan: Additional outreach attempts will be made to reach the patient to complete the Transitions of Care (Post Inpatient/ED visit) call.   Johnney Killian, RN, BSN, CCM Care Management Coordinator [place] Phone: [phone]: [phone]

## 2022-10-20 NOTE — Telephone Encounter (Signed)
Reordered [place] sent to [place] at [employer]

## 2022-10-20 NOTE — Telephone Encounter (Signed)
Kathy Frank from patient stated that  she could not/did not get the prescription for the Ubrelvy..  RTC to patient to see where she would like to have the new prescription sent.  Unable to reach or leave a message.

## 2022-10-20 NOTE — Telephone Encounter (Signed)
Call from patient about her Kathy Frank for her migraines. Stated that Dr. Lisabeth Devoid spoke to her about it on yesterday-not sure if she got medication at discharge.  Patient to check to see if she received the medication at discharge when she leaves Dialysis. Patient to call if she cannot locate the medication.  Call to [place] patient received the medication at her recent discharge.

## 2022-10-21 ENCOUNTER — Telehealth: Payer: Self-pay

## 2022-10-21 NOTE — Transitions of Care (Post Inpatient/ED Visit) (Signed)
   10/21/2022  Name: DAIJAH SCRIVENS MRN: [id number] DOB: [birth year]  Today's TOC FU Call Status:    Attempted to reach the patient regarding the most recent Inpatient/ED visit.  Follow Up Plan: No further outreach attempts will be made at this time. We have been unable to contact the patient.  Johnney Killian, RN, BSN, CCM Care Management Coordinator [place] Phone: [phone]: [phone]

## 2022-10-23 NOTE — Addendum Note (Signed)
Addended by: Gilles Chiquito B on: 10/23/2022 11:35 AM   Modules accepted: Level of Service

## 2022-10-23 NOTE — Progress Notes (Signed)
Internal Medicine Clinic Attending  Case and documentation of Dr. Lisabeth Devoid  soon after the resident saw the patient reviewed.  I reviewed the AWV findings.  I agree with the assessment, diagnosis, and plan of care documented in the AWV note.

## 2022-10-23 NOTE — Progress Notes (Signed)
Internal Medicine Clinic Attending  Case discussed with Dr. Liang  at the time of the visit.  We reviewed the resident's history and exam and pertinent patient test results.  I agree with the assessment, diagnosis, and plan of care documented in the resident's note.  

## 2022-10-24 DIAGNOSIS — I120 Hypertensive chronic kidney disease with stage 5 chronic kidney disease or end stage renal disease: Secondary | ICD-10-CM | POA: Diagnosis not present

## 2022-10-24 DIAGNOSIS — Z992 Dependence on renal dialysis: Secondary | ICD-10-CM | POA: Diagnosis not present

## 2022-10-24 DIAGNOSIS — R519 Headache, unspecified: Secondary | ICD-10-CM | POA: Diagnosis not present

## 2022-10-24 DIAGNOSIS — D631 Anemia in chronic kidney disease: Secondary | ICD-10-CM | POA: Diagnosis not present

## 2022-10-24 DIAGNOSIS — N186 End stage renal disease: Secondary | ICD-10-CM | POA: Diagnosis not present

## 2022-10-24 DIAGNOSIS — N2581 Secondary hyperparathyroidism of renal origin: Secondary | ICD-10-CM | POA: Diagnosis not present

## 2022-10-27 ENCOUNTER — Ambulatory Visit (INDEPENDENT_AMBULATORY_CARE_PROVIDER_SITE_OTHER): Payer: 59 | Admitting: Student

## 2022-10-27 DIAGNOSIS — F419 Anxiety disorder, unspecified: Secondary | ICD-10-CM

## 2022-10-27 DIAGNOSIS — I120 Hypertensive chronic kidney disease with stage 5 chronic kidney disease or end stage renal disease: Secondary | ICD-10-CM | POA: Diagnosis not present

## 2022-10-27 DIAGNOSIS — Z992 Dependence on renal dialysis: Secondary | ICD-10-CM | POA: Diagnosis not present

## 2022-10-27 DIAGNOSIS — D631 Anemia in chronic kidney disease: Secondary | ICD-10-CM | POA: Diagnosis not present

## 2022-10-27 DIAGNOSIS — T148XXA Other injury of unspecified body region, initial encounter: Secondary | ICD-10-CM | POA: Diagnosis not present

## 2022-10-27 DIAGNOSIS — N186 End stage renal disease: Secondary | ICD-10-CM | POA: Diagnosis not present

## 2022-10-27 DIAGNOSIS — R519 Headache, unspecified: Secondary | ICD-10-CM | POA: Diagnosis not present

## 2022-10-27 DIAGNOSIS — N2581 Secondary hyperparathyroidism of renal origin: Secondary | ICD-10-CM | POA: Diagnosis not present

## 2022-10-29 DIAGNOSIS — D631 Anemia in chronic kidney disease: Secondary | ICD-10-CM | POA: Diagnosis not present

## 2022-10-29 DIAGNOSIS — R519 Headache, unspecified: Secondary | ICD-10-CM | POA: Diagnosis not present

## 2022-10-29 DIAGNOSIS — T148XXA Other injury of unspecified body region, initial encounter: Secondary | ICD-10-CM | POA: Insufficient documentation

## 2022-10-29 DIAGNOSIS — N186 End stage renal disease: Secondary | ICD-10-CM | POA: Diagnosis not present

## 2022-10-29 DIAGNOSIS — N2581 Secondary hyperparathyroidism of renal origin: Secondary | ICD-10-CM | POA: Diagnosis not present

## 2022-10-29 DIAGNOSIS — Z992 Dependence on renal dialysis: Secondary | ICD-10-CM | POA: Diagnosis not present

## 2022-10-29 DIAGNOSIS — I120 Hypertensive chronic kidney disease with stage 5 chronic kidney disease or end stage renal disease: Secondary | ICD-10-CM | POA: Diagnosis not present

## 2022-10-29 NOTE — Assessment & Plan Note (Addendum)
Telehealth today reports blister on her left thigh that she noticed this morning.  Reports clear drainage from this without bleeding itching or pain.  Rashes not bothersome to her.  No redness, or swelling.  Has not had similar blisters in the past.   Discussed follow-up in clinic for further evaluation.

## 2022-10-29 NOTE — Assessment & Plan Note (Signed)
Nausea and vomiting since starting sertraline for anxiety.  Reports soda vomiting yesterday.  Feels she is not able to eat much food due to this. Has been taking sertraline for about a week.  She is not sure if symptoms are getting worse or better.  No dizziness, or syncope.  Denies fevers, chills, constipation, melena, or hematochezia.    Suspect her nausea is due to starting on SSRI.  Will have her try sertraline a few more days to see if her symptoms improve with time.  If not improving can discontinue sertraline.  She will follow-up in clinic for further evaluation of her nausea given her history of recent upper GI bleeding secondary to gastric ulcer which could also cause similar symptoms.  She is agreeable to this.

## 2022-10-29 NOTE — Progress Notes (Signed)
  [facility] Internal Medicine Residency Telephone Encounter Continuity Care Appointment  HPI:  This telephone encounter was created for [name] on 10/29/2022 for the following purpose/cc nausea.   Past Medical History:  Past Medical History:  Diagnosis Date   Anal abscess    chronic   Anxiety    CAD (coronary artery disease)    CVA (cerebral vascular accident) ([place])    Depression 06/28/2006   Qualifier: Diagnosis of  By: Riccardo Dubin MD, [place]     Diabetes mellitus type 2 in obese ([place]) 06/28/1994   Dyspnea    uses oxygeb 2 liters per minute at dialysis   End stage renal disease on dialysis [facility])    on hemodialysis T/Th/Sat   Erosive esophagitis    Esophageal reflux    Eye redness    Gastroparesis    ? diabetic   GERD (gastroesophageal reflux disease)    History of blood transfusion 2019   Human immunodeficiency virus (HIV) disease ([place]) 04/23/2016   Hyperlipidemia    Hypertension    Metabolic bone disease [id number]   Moderate nonproliferative diabetic retinopathy of both eyes ([place]) 11/21/2014   11/14/14: Noted on retinal imaging; needs follow-up imaging in 6 months  05/22/16: Noted on retinal imaging again; needs follow-up imaging in 6 months   PAD (peripheral artery disease) ([place])    Type 2 diabetes mellitus with diabetic peripheral angiopathy without gangrene ([place]) 05/01/2019   Wears dentures    lower   Wound infection s/p L transmetatarsal amputation      ROS:  Nausea, vomiting, GI upset   Assessment / Plan / Recommendations:  Please see A&P under problem oriented charting for assessment of the patient's acute and chronic medical conditions.  As always, pt is advised that if symptoms worsen or new symptoms arise, they should go to an urgent care facility or to to ER for further evaluation.   Consent and Medical Decision Making:  Patient discussed with Dr.  Saverio Danker This is a telephone encounter between Kathy Frank and [name] on 10/29/2022 for nausea  and vomiting. The visit was conducted with the patient located at home and [name] at [facility]. The patient's identity was confirmed using their DOB and current address. The patient has consented to being evaluated through a telephone encounter and understands the associated risks (an examination cannot be done and the patient may need to come in for an appointment) / benefits (allows the patient to remain at home, decreasing exposure to coronavirus). I personally spent 25 minutes on medical discussion.

## 2022-10-30 ENCOUNTER — Other Ambulatory Visit: Payer: Self-pay

## 2022-10-30 NOTE — Addendum Note (Signed)
Addended by: Charise Killian on: 10/30/2022 09:12 AM   Modules accepted: Level of Service

## 2022-10-30 NOTE — Progress Notes (Signed)
Internal Medicine Clinic Attending ? ?Case discussed with Dr. Liang  At the time of the visit.  We reviewed the resident?s history and exam and pertinent patient test results.  I agree with the assessment, diagnosis, and plan of care documented in the resident?s note. ? ?

## 2022-10-30 NOTE — Progress Notes (Deleted)
Cardiology Clinic Note   Date: 10/30/2022 ID: Jlynn, Hengst [birth year], MRN [id number]  Primary Cardiologist:  Berniece Salines, DO  Patient Profile    Kathy Frank is a 47 y.o. female who presents to the clinic today for evaluation of chest pain***.  Past medical history significant for: CAD. LHC 05/21/2021 (NSTEMI): Mid LAD 75%.  Proximal RCA 35%.  Distal RCA 40%.  PCI with DES to mid LAD.  Recommendations for uninterrupted DAPT x 1 year. Palpitations. PAD. 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 Hypertension. Hyperlipidemia. Lipid panel 10/19/2022: LDL 38, HDL 47, TG 120, total 106. T2DM. CVA. HIV. Migraines. ESRD. HD TuThSa Gastric ulcers. Hospitalization 10/12/2022 to 10/16/2022 for upper GI bleed (secondary to NSAID use). Transfused 2 units.   History of Present Illness    Kathy Frank was first evaluated by Dr. Harriet Masson on 05/21/2021 during hospitalization.  She presented to the emergency room via EMS on 05/20/2021 for complaints of chest pain, shortness of breath that began the night previous.  She ruled in for NSTEMI and underwent PCI with DES to mid LAD.  Patient was last seen in the office by Dr. Harriet Masson 09/03/2021.  At that time she was doing well overall.  She complained of palpitations medications were switched from metoprolol to tartrate to propranolol and 2 weeks ZIO was ordered to rule out A-fib.  She was educated about diet modifications and reducing sodium in her diet.  Does not appear monitor was ever completed.  Patient has had several ED visits for complaints of hypertension, epistaxis, generalized weakness, migraine, and chest pain since being last seen in the office.  Most recently, patient presented to the emergency department via EMS from home on 10/12/2022 for complaints of shortness of breath, chest tightness, and headache.  Troponin was slightly elevated but flat.  She was admitted for GI bleed and transfused 2 units.  She was discharged on  10/16/2022.  Today, patient ***  Propranolol?  CAD.  S/p PCI with DES mid LAD October 2022.  Patient *** Continue atorvastatin, aspirin. *** Palpitations. *** PAD.  S/p left AKA March 2020, right BKA June 2022. *** Hypertension.  BP today *** Patient *** Hyperlipidemia. LDL March 2024 38, at goal. Continue atorvastatin.    ROS: All other systems reviewed and are otherwise negative except as noted in History of Present Illness.  Studies Reviewed    ECG personally reviewed by me today: ***  No significant changes from ***  Risk Assessment/Calculations    {Does this patient have ATRIAL FIBRILLATION?:[phone]} No BP recorded.  {Refresh Note OR Click here to enter BP  :1}***        Physical Exam    VS:  There were no vitals taken for this visit. , BMI There is no height or weight on file to calculate BMI.  GEN: Well nourished, well developed, in no acute distress. Neck: No JVD or carotid bruits. Cardiac: *** RRR. No murmurs. No rubs or gallops.   Respiratory:  Respirations regular and unlabored. Clear to auscultation without rales, wheezing or rhonchi. GI: Soft, nontender, nondistended. Extremities: Radials/DP/PT 2+ and equal bilaterally. No clubbing or cyanosis. No edema ***  Skin: Warm and dry, no rash. Neuro: Strength intact.  Assessment & Plan   ***  Disposition: ***     {Are you ordering a CV Procedure (e.g. stress test, cath, DCCV, TEE, etc)?   Press F2        :[id number]   Signed, Justice Britain. Mikael Debell, DNP,  NP-C

## 2022-10-31 DIAGNOSIS — Z992 Dependence on renal dialysis: Secondary | ICD-10-CM | POA: Diagnosis not present

## 2022-10-31 DIAGNOSIS — D631 Anemia in chronic kidney disease: Secondary | ICD-10-CM | POA: Diagnosis not present

## 2022-10-31 DIAGNOSIS — N186 End stage renal disease: Secondary | ICD-10-CM | POA: Diagnosis not present

## 2022-10-31 DIAGNOSIS — I120 Hypertensive chronic kidney disease with stage 5 chronic kidney disease or end stage renal disease: Secondary | ICD-10-CM | POA: Diagnosis not present

## 2022-10-31 DIAGNOSIS — N2581 Secondary hyperparathyroidism of renal origin: Secondary | ICD-10-CM | POA: Diagnosis not present

## 2022-10-31 DIAGNOSIS — R519 Headache, unspecified: Secondary | ICD-10-CM | POA: Diagnosis not present

## 2022-11-02 ENCOUNTER — Other Ambulatory Visit (HOSPITAL_COMMUNITY): Payer: Self-pay

## 2022-11-02 ENCOUNTER — Ambulatory Visit: Payer: 59 | Attending: Student | Admitting: Student

## 2022-11-03 ENCOUNTER — Ambulatory Visit (INDEPENDENT_AMBULATORY_CARE_PROVIDER_SITE_OTHER): Payer: 59

## 2022-11-03 VITALS — BP 169/73 | HR 93 | Temp 98.2°F | Ht 65.0 in

## 2022-11-03 DIAGNOSIS — I120 Hypertensive chronic kidney disease with stage 5 chronic kidney disease or end stage renal disease: Secondary | ICD-10-CM | POA: Diagnosis not present

## 2022-11-03 DIAGNOSIS — Z992 Dependence on renal dialysis: Secondary | ICD-10-CM | POA: Diagnosis not present

## 2022-11-03 DIAGNOSIS — S70322D Blister (nonthermal), left thigh, subsequent encounter: Secondary | ICD-10-CM | POA: Diagnosis not present

## 2022-11-03 DIAGNOSIS — R519 Headache, unspecified: Secondary | ICD-10-CM | POA: Diagnosis not present

## 2022-11-03 DIAGNOSIS — N186 End stage renal disease: Secondary | ICD-10-CM | POA: Diagnosis not present

## 2022-11-03 DIAGNOSIS — F331 Major depressive disorder, recurrent, moderate: Secondary | ICD-10-CM | POA: Diagnosis not present

## 2022-11-03 DIAGNOSIS — I10 Essential (primary) hypertension: Secondary | ICD-10-CM | POA: Diagnosis not present

## 2022-11-03 DIAGNOSIS — N2581 Secondary hyperparathyroidism of renal origin: Secondary | ICD-10-CM | POA: Diagnosis not present

## 2022-11-03 DIAGNOSIS — T148XXA Other injury of unspecified body region, initial encounter: Secondary | ICD-10-CM

## 2022-11-03 DIAGNOSIS — D631 Anemia in chronic kidney disease: Secondary | ICD-10-CM | POA: Diagnosis not present

## 2022-11-03 MED ORDER — LANTUS SOLOSTAR 100 UNIT/ML ~~LOC~~ SOPN: 15.0000 [IU] | PEN_INJECTOR | Freq: Every day | SUBCUTANEOUS | 4 refills | Status: DC

## 2022-11-03 MED ORDER — ATORVASTATIN CALCIUM 80 MG PO TABS: 80.0000 mg | ORAL_TABLET | Freq: Every day | ORAL | 11 refills | Status: DC

## 2022-11-03 MED ORDER — ESCITALOPRAM OXALATE 5 MG PO TABS: 5.0000 mg | ORAL_TABLET | Freq: Every day | ORAL | 2 refills | Status: DC

## 2022-11-03 MED ORDER — LIRAGLUTIDE 18 MG/3ML ~~LOC~~ SOPN: 0.6000 mg | PEN_INJECTOR | Freq: Every day | SUBCUTANEOUS | 4 refills | Status: DC

## 2022-11-03 NOTE — Assessment & Plan Note (Signed)
Patient reports GI upset including nausea and vomiting since beginning her sertraline.  -switch to Lexapro 5 mg which has less GI side effects

## 2022-11-03 NOTE — Assessment & Plan Note (Signed)
Two blisters at left anteromedial thigh for one week. Appear to be healing with only minimal scabbing. No discharge. Skin intact. Denies fevers, chills, pain, itching. No other lesions. Recent HIV RNA quant undetectable.

## 2022-11-03 NOTE — Progress Notes (Signed)
CC: left thigh blister  HPI:  [name] is a 47 y.o. with past medical history as below who presents for left thigh blister. Please see detailed assessment and plan for HPI.  Past Medical History:  Diagnosis Date   Anal abscess    chronic   Anxiety    CAD (coronary artery disease)    CVA (cerebral vascular accident) ([place])    Depression 06/28/2006   Qualifier: Diagnosis of  By: Riccardo Dubin MD, [place]     Diabetes mellitus type 2 in obese ([place]) 06/28/1994   Dyspnea    uses oxygeb 2 liters per minute at dialysis   End stage renal disease on dialysis [facility])    on hemodialysis T/Th/Sat   Erosive esophagitis    Esophageal reflux    Eye redness    Gastroparesis    ? diabetic   GERD (gastroesophageal reflux disease)    History of blood transfusion 2019   Human immunodeficiency virus (HIV) disease ([place]) 04/23/2016   Hyperlipidemia    Hypertension    Metabolic bone disease [id number]   Moderate nonproliferative diabetic retinopathy of both eyes ([place]) 11/21/2014   11/14/14: Noted on retinal imaging; needs follow-up imaging in 6 months  05/22/16: Noted on retinal imaging again; needs follow-up imaging in 6 months   PAD (peripheral artery disease) ([place])    Type 2 diabetes mellitus with diabetic peripheral angiopathy without gangrene ([place]) 05/01/2019   Wears dentures    lower   Wound infection s/p L transmetatarsal amputation    Review of Systems:  Please see detailed assessment and plan for pertinent ROS.  Physical Exam:  Vitals:   11/03/22 1435 11/03/22 1500  BP: (!) 198/87 (!) 169/73  Pulse: 97 93  Temp: 98.2 F (36.8 C)   TempSrc: Oral   SpO2: 100%   Height: 5\' 5"  (1.651 m)    Physical Exam Constitutional:      General: She is not in acute distress.    Comments: In wheelchair  HENT:     Head: Normocephalic and atraumatic.  Eyes:     Extraocular Movements: Extraocular movements intact.  Cardiovascular:     Rate and Rhythm: Normal rate and regular rhythm.   Pulmonary:     Effort: Pulmonary effort is normal.     Breath sounds: No wheezing, rhonchi or rales.  Abdominal:     General: There is no distension.     Palpations: Abdomen is soft.     Tenderness: There is no abdominal tenderness.  Musculoskeletal:     Comments: Bilateral lower extremity amputations  Skin:    General: Skin is warm and dry.     Comments: 2 healing blisters at left thigh. No erythema or discharge.  Neurological:     Mental Status: She is alert.      Assessment & Plan:   See Encounters Tab for problem based charting.  Essential hypertension Patient presents with history of hypertension treated with amlodipine 10 mg daily. BP 198/87 initially but 169/73 on recheck. She did not take today's dose until right before this appointment. Denies vision changes, chest pain, shortness of breath. Denies swelling.  -continue amlodipine 10 mg daily  Blister Two blisters at left anteromedial thigh for one week. Appear to be healing with only minimal scabbing. No discharge. Skin intact. Denies fevers, chills, pain, itching. No other lesions. Recent HIV RNA quant undetectable.  Depression Patient reports GI upset including nausea and vomiting since beginning her sertraline.  -switch to Lexapro 5 mg which has  less GI side effects     Patient discussed with Dr.  Saverio Danker

## 2022-11-03 NOTE — Assessment & Plan Note (Signed)
Patient presents with history of hypertension treated with amlodipine 10 mg daily. BP 198/87 initially but 169/73 on recheck. She did not take today's dose until right before this appointment. Denies vision changes, chest pain, shortness of breath. Denies swelling.  -continue amlodipine 10 mg daily

## 2022-11-03 NOTE — Patient Instructions (Signed)
[name], it was a pleasure seeing you today!  Today we discussed: GI upset - We will switch from sertraline to Lexapro 5 mg daily. Give it a few weeks to kick in. We can go up on dose until your anxiety symptoms improve. Blister - this should resolve on its own. Let us know if it worsens or fails to improve. Blood pressure - continue taking amlodipine 10 mg daily  I have ordered the following labs today:  Lab Orders  No laboratory test(s) ordered today     Tests ordered today:  none  Referrals ordered today:   Referral Orders  No referral(s) requested today     I have ordered the following medication/changed the following medications:   Stop the following medications: Medications Discontinued During This Encounter  Medication Reason   sertraline (ZOLOFT) 25 MG tablet Change in therapy   insulin glargine (LANTUS SOLOSTAR) 100 UNIT/ML Solostar Pen Reorder   liraglutide (VICTOZA) 18 MG/3ML SOPN Reorder   atorvastatin (LIPITOR) 80 MG tablet Reorder     Start the following medications: Meds ordered this encounter  Medications   escitalopram (LEXAPRO) 5 MG tablet    Sig: Take 1 tablet (5 mg total) by mouth daily.    Dispense:  30 tablet    Refill:  2   liraglutide (VICTOZA) 18 MG/3ML SOPN    Sig: Inject 0.6 mg into the skin daily.    Dispense:  9 mL    Refill:  4   insulin glargine (LANTUS SOLOSTAR) 100 UNIT/ML Solostar Pen    Sig: Inject 15 Units into the skin at bedtime.    Dispense:  6 mL    Refill:  4   atorvastatin (LIPITOR) 80 MG tablet    Sig: Take 1 tablet (80 mg total) by mouth daily.    Dispense:  30 tablet    Refill:  11     Follow-up: 3 months   Please make sure to arrive 15 minutes prior to your next appointment. If you arrive late, you may be asked to reschedule.   We look forward to seeing you next time. Please call our clinic at [phone] if you have any questions or concerns. The best time to call is Monday-Friday from 9am-4pm, but  there is someone available 24/7. If after hours or the weekend, call the main hospital number and ask for the Internal Medicine Resident On-Call. If you need medication refills, please notify your pharmacy one week in advance and they will send [place] a request.  Thank you for letting us take part in your care. Wishing you the best!  Thank you, Linward Natal, MD

## 2022-11-04 ENCOUNTER — Telehealth: Payer: Self-pay

## 2022-11-04 NOTE — Telephone Encounter (Signed)
Prior Authorization for patient (Victoza) came through on cover my meds was submitted with last office notes and labs awaiting approval or denial 

## 2022-11-04 NOTE — Telephone Encounter (Signed)
Decision:Approved [name] (KeyLu Duffel) PA Case ID #: [id number] Rx #: [id number] Need Help? Call us at [phone] Outcome Approved today Request Reference Number: [id number]. VICTOZA INJ 18MG /3ML is approved through 08/10/2023. Your patient may now fill this prescription and it will be covered. Authorization Expiration Date: 08/10/2023 Drug Victoza 18MG F[employer]ne Mediate pen-injectors ePA cloud logo Form [employer] Medicare Part D Electronic Prior Authorization Form [phone] NCPDP) [place] [phone]

## 2022-11-05 ENCOUNTER — Other Ambulatory Visit: Payer: Self-pay | Admitting: Student

## 2022-11-05 DIAGNOSIS — D631 Anemia in chronic kidney disease: Secondary | ICD-10-CM | POA: Diagnosis not present

## 2022-11-05 DIAGNOSIS — Z992 Dependence on renal dialysis: Secondary | ICD-10-CM | POA: Diagnosis not present

## 2022-11-05 DIAGNOSIS — I120 Hypertensive chronic kidney disease with stage 5 chronic kidney disease or end stage renal disease: Secondary | ICD-10-CM | POA: Diagnosis not present

## 2022-11-05 DIAGNOSIS — N2581 Secondary hyperparathyroidism of renal origin: Secondary | ICD-10-CM | POA: Diagnosis not present

## 2022-11-05 DIAGNOSIS — R519 Headache, unspecified: Secondary | ICD-10-CM | POA: Diagnosis not present

## 2022-11-05 DIAGNOSIS — N186 End stage renal disease: Secondary | ICD-10-CM | POA: Diagnosis not present

## 2022-11-05 NOTE — Addendum Note (Signed)
Addended by: Charise Killian on: 11/05/2022 11:15 AM   Modules accepted: Level of Service

## 2022-11-05 NOTE — Progress Notes (Signed)
Internal Medicine Clinic Attending  Case discussed with Dr. White  At the time of the visit.  We reviewed the resident's history and exam and pertinent patient test results.  I agree with the assessment, diagnosis, and plan of care documented in the resident's note.  

## 2022-11-05 NOTE — Telephone Encounter (Signed)
Refill Request  Insulin Pen Needle (PENTIPS) 32G X 4 MM [place] ([place]), [place] - [address] (Ph: [phone])

## 2022-11-06 ENCOUNTER — Encounter (HOSPITAL_COMMUNITY): Payer: Self-pay

## 2022-11-06 ENCOUNTER — Emergency Department (HOSPITAL_COMMUNITY)
Admission: EM | Admit: 2022-11-06 | Discharge: 2022-11-07 | Disposition: A | Discharge status: Home / Self Care | Payer: 59 | Attending: Emergency Medicine | Admitting: Emergency Medicine

## 2022-11-06 ENCOUNTER — Other Ambulatory Visit: Payer: Self-pay

## 2022-11-06 DIAGNOSIS — Z992 Dependence on renal dialysis: Secondary | ICD-10-CM | POA: Diagnosis not present

## 2022-11-06 DIAGNOSIS — R109 Unspecified abdominal pain: Secondary | ICD-10-CM | POA: Diagnosis not present

## 2022-11-06 DIAGNOSIS — D631 Anemia in chronic kidney disease: Secondary | ICD-10-CM | POA: Insufficient documentation

## 2022-11-06 DIAGNOSIS — Z79899 Other long term (current) drug therapy: Secondary | ICD-10-CM | POA: Diagnosis not present

## 2022-11-06 DIAGNOSIS — K625 Hemorrhage of anus and rectum: Secondary | ICD-10-CM

## 2022-11-06 DIAGNOSIS — E1122 Type 2 diabetes mellitus with diabetic chronic kidney disease: Secondary | ICD-10-CM | POA: Diagnosis not present

## 2022-11-06 DIAGNOSIS — I120 Hypertensive chronic kidney disease with stage 5 chronic kidney disease or end stage renal disease: Secondary | ICD-10-CM | POA: Insufficient documentation

## 2022-11-06 DIAGNOSIS — K6289 Other specified diseases of anus and rectum: Secondary | ICD-10-CM | POA: Insufficient documentation

## 2022-11-06 DIAGNOSIS — I2510 Atherosclerotic heart disease of native coronary artery without angina pectoris: Secondary | ICD-10-CM | POA: Diagnosis not present

## 2022-11-06 DIAGNOSIS — D649 Anemia, unspecified: Secondary | ICD-10-CM | POA: Diagnosis not present

## 2022-11-06 DIAGNOSIS — N189 Chronic kidney disease, unspecified: Secondary | ICD-10-CM

## 2022-11-06 DIAGNOSIS — I10 Essential (primary) hypertension: Secondary | ICD-10-CM

## 2022-11-06 DIAGNOSIS — N186 End stage renal disease: Secondary | ICD-10-CM | POA: Insufficient documentation

## 2022-11-06 LAB — COMPREHENSIVE METABOLIC PANEL
ALT: 10 U/L (ref 0–44)
AST: 12 U/L — ABNORMAL LOW (ref 15–41)
Albumin: 3 g/dL — ABNORMAL LOW (ref 3.5–5.0)
Alkaline Phosphatase: 58 U/L (ref 38–126)
Anion gap: 17 — ABNORMAL HIGH (ref 5–15)
BUN: 27 mg/dL — ABNORMAL HIGH (ref 6–20)
CO2: 22 mmol/L (ref 22–32)
Calcium: 8.4 mg/dL — ABNORMAL LOW (ref 8.9–10.3)
Chloride: 96 mmol/L — ABNORMAL LOW (ref 98–111)
Creatinine, Ser: 6.9 mg/dL — ABNORMAL HIGH (ref 0.44–1.00)
GFR, Estimated: 7 mL/min — ABNORMAL LOW (ref >60)
Glucose, Bld: 234 mg/dL — ABNORMAL HIGH (ref 70–99)
Potassium: 3.4 mmol/L — ABNORMAL LOW (ref 3.5–5.1)
Sodium: 135 mmol/L (ref 135–145)
Total Bilirubin: 0.5 mg/dL (ref 0.3–1.2)
Total Protein: 8.1 g/dL (ref 6.5–8.1)

## 2022-11-06 LAB — PROTIME-INR
INR: 1.2 (ref 0.8–1.2)
Prothrombin Time: 15 seconds (ref 11.4–15.2)

## 2022-11-06 LAB — CBC
HCT: 30.5 % — ABNORMAL LOW (ref 36.0–46.0)
Hemoglobin: 9.2 g/dL — ABNORMAL LOW (ref 12.0–15.0)
MCH: 29 pg (ref 26.0–34.0)
MCHC: 30.2 g/dL (ref 30.0–36.0)
MCV: 96.2 fL (ref 80.0–100.0)
Platelets: 341 10*3/uL (ref 150–400)
RBC: 3.17 MIL/uL — ABNORMAL LOW (ref 3.87–5.11)
RDW: 16.6 % — ABNORMAL HIGH (ref 11.5–15.5)
WBC: 5.7 10*3/uL (ref 4.0–10.5)
nRBC: 0 % (ref 0.0–0.2)

## 2022-11-06 LAB — POC OCCULT BLOOD, ED: Fecal Occult Bld: POSITIVE — AB

## 2022-11-06 LAB — TYPE AND SCREEN
ABO/RH(D): B POS
Antibody Screen: NEGATIVE

## 2022-11-06 MED ORDER — DICYCLOMINE HCL 10 MG PO CAPS
10.0000 mg | ORAL_CAPSULE | Freq: Once | ORAL | Status: AC
  Administered 2022-11-06: 10 mg via ORAL
  Filled 2022-11-06: qty 1

## 2022-11-06 MED ORDER — FENTANYL CITRATE PF 50 MCG/ML IJ SOSY
50.0000 ug | PREFILLED_SYRINGE | Freq: Once | INTRAMUSCULAR | Status: AC
  Administered 2022-11-06: 50 ug via INTRAVENOUS
  Filled 2022-11-06: qty 1

## 2022-11-06 MED ORDER — PANTOPRAZOLE 80MG IVPB - SIMPLE MED
80.0000 mg | Freq: Once | INTRAVENOUS | Status: AC
  Administered 2022-11-06: 80 mg via INTRAVENOUS
  Filled 2022-11-06 (×2): qty 100

## 2022-11-06 NOTE — ED Triage Notes (Addendum)
Pt came in d/t having a BM on herself during the night & when she went to the bathroom this morning she saw blood in her stool. Denies any abd pain, A/Ox4, Hx of stomach ulcer.

## 2022-11-06 NOTE — ED Notes (Signed)
[phone] Watt Climes pt aunt called

## 2022-11-06 NOTE — ED Provider Notes (Signed)
Emergency Department Provider Note   I have reviewed the triage vital signs and the nursing notes.   HISTORY  Chief Complaint Blood in stool   HPI Kathy Frank is a 47 y.o. female with past history of ADD, diabetes, ESRD on dialysis presents to the emergency department with bright red blood per rectum.  Reports blood in the toilet after bowel movement.  She had an admission earlier this month with upper GI bleeding requiring upper endoscopy and blood transfusion.  She is been compliant with her home medications.  She denies any abdominal or rectal pain. No fever. Denies any black/sticky stool.    Past Medical History:  Diagnosis Date   Anal abscess    chronic   Anxiety    CAD (coronary artery disease)    CVA (cerebral vascular accident) ([place])    Depression 06/28/2006   Qualifier: Diagnosis of  By: Riccardo Dubin MD, [place]     Diabetes mellitus type 2 in obese ([place]) 06/28/1994   Dyspnea    uses oxygeb 2 liters per minute at dialysis   End stage renal disease on dialysis [facility])    on hemodialysis T/Th/Sat   Erosive esophagitis    Esophageal reflux    Eye redness    Gastroparesis    ? diabetic   GERD (gastroesophageal reflux disease)    History of blood transfusion 2019   Human immunodeficiency virus (HIV) disease ([place]) 04/23/2016   Hyperlipidemia    Hypertension    Metabolic bone disease [id number]   Moderate nonproliferative diabetic retinopathy of both eyes ([place]) 11/21/2014   11/14/14: Noted on retinal imaging; needs follow-up imaging in 6 months  05/22/16: Noted on retinal imaging again; needs follow-up imaging in 6 months   PAD (peripheral artery disease) ([place])    Type 2 diabetes mellitus with diabetic peripheral angiopathy without gangrene ([place]) 05/01/2019   Wears dentures    lower   Wound infection s/p L transmetatarsal amputation     Review of Systems  Constitutional: No fever/chills Eyes: No visual changes. ENT: No sore throat. Cardiovascular: Denies chest  pain. Respiratory: Denies shortness of breath. Gastrointestinal: Positive abdominal pain.  No nausea, no vomiting.  No diarrhea.  No constipation. Positive blood per rectum.  Genitourinary: Negative for dysuria. Musculoskeletal: Negative for back pain. Skin: Negative for rash. Neurological: Negative for headaches, focal weakness or numbness.  ____________________________________________   PHYSICAL EXAM:  VITAL SIGNS: ED Triage Vitals  Enc Vitals Group     BP 11/06/22 1409 (!) 184/84     Pulse Rate 11/06/22 1409 98     Resp 11/06/22 1409 20     Temp 11/06/22 1409 97.8 F (36.6 C)     Temp Source 11/06/22 1409 Oral     SpO2 11/06/22 1409 100 %   Constitutional: Alert and oriented. Well appearing and in no acute distress. Eyes: Conjunctivae are normal.  Head: Atraumatic. Nose: No congestion/rhinnorhea. Mouth/Throat: Mucous membranes are moist.   Neck: No stridor.   Cardiovascular: Normal rate, regular rhythm. Good peripheral circulation. Grossly normal heart sounds.   Respiratory: Normal respiratory effort.  No retractions. Lungs CTAB. Gastrointestinal: Soft and nontender. No distention.  Musculoskeletal: No lower extremity tenderness nor edema. No gross deformities of extremities. Neurologic:  Normal speech and language. No gross focal neurologic deficits are appreciated.  Skin:  Skin is warm, dry and intact. No rash noted.   ____________________________________________   LABS (all labs ordered are listed, but only abnormal results are displayed)  Labs Reviewed  COMPREHENSIVE METABOLIC  PANEL - Abnormal; Notable for the following components:      Result Value   Potassium 3.4 (*)    Chloride 96 (*)    Glucose, Bld 234 (*)    BUN 27 (*)    Creatinine, Ser 6.90 (*)    Calcium 8.4 (*)    Albumin 3.0 (*)    AST 12 (*)    GFR, Estimated 7 (*)    Anion gap 17 (*)    All other components within normal limits  CBC - Abnormal; Notable for the following components:   RBC  3.17 (*)    Hemoglobin 9.2 (*)    HCT 30.5 (*)    RDW 16.6 (*)    All other components within normal limits  HEMOGLOBIN AND HEMATOCRIT, BLOOD - Abnormal; Notable for the following components:   Hemoglobin 9.8 (*)    HCT 31.0 (*)    All other components within normal limits  POC OCCULT BLOOD, ED - Abnormal; Notable for the following components:   Fecal Occult Bld POSITIVE (*)    All other components within normal limits  PROTIME-INR  I-STAT BETA HCG BLOOD, ED ([facility], [facility], [facility] ONLY)  TYPE AND SCREEN   ____________________________________________  RADIOLOGY  CT ABDOMEN PELVIS WO CONTRAST  Result Date: 11/07/2022 CLINICAL DATA:  Acute, nonlocalized abdominal pain EXAM: CT ABDOMEN AND PELVIS WITHOUT CONTRAST TECHNIQUE: Multidetector CT imaging of the abdomen and pelvis was performed following the standard protocol without IV contrast. RADIATION DOSE REDUCTION: This exam was performed according to the departmental dose-optimization program which includes automated exposure control, adjustment of the mA and/or kV according to patient size and/or use of iterative reconstruction technique. COMPARISON:  12/31/2018 FINDINGS: Lower chest: Atelectasis or scarring at the lung bases. Coronary atherosclerosis. Hepatobiliary: No focal liver abnormality.No evidence of biliary obstruction or stone. Pancreas: Unremarkable. Spleen: Unremarkable. Adrenals/Urinary Tract: Negative adrenals. No hydronephrosis or stone. Symmetric renal atrophy. Unremarkable bladder. Stomach/Bowel: Perirectal edema without certain wall thickening. No bowel obstruction. No appendicitis. Vascular/Lymphatic: No acute vascular abnormality. Severe and confluent atheromatous calcification of the aorta and branch vessels. No mass or adenopathy. Reproductive:No pathologic findings. Other: No ascites or pneumoperitoneum. Musculoskeletal: No acute abnormalities. IMPRESSION: 1. Perirectal edema favoring proctitis over third-spacing. 2. Progressive renal  atrophy and atherosclerosis since 2020. Electronically Signed   By: Jorje Guild M.D.   On: 11/07/2022 04:20    ____________________________________________   PROCEDURES  Procedure(s) performed:   Procedures  None ____________________________________________   INITIAL IMPRESSION / ASSESSMENT AND PLAN / ED COURSE  Pertinent labs & imaging results that were available during my care of the patient were reviewed by me and considered in my medical decision making (see chart for details).   This patient is Presenting for Evaluation of abdominal pain/GI bleeding, which does require a range of treatment options, and is a complaint that involves a high risk of morbidity and mortality.  The Differential Diagnoses  includes but is not exclusive to acute cholecystitis, intrathoracic causes for epigastric abdominal pain, gastritis, duodenitis, pancreatitis, small bowel or large bowel obstruction, abdominal aortic aneurysm, hernia, gastritis, etc.   Critical Interventions-    Medications  pantoprazole (PROTONIX) 80 mg /NS 100 mL IVPB (0 mg Intravenous Stopped 11/06/22 2005)  fentaNYL (SUBLIMAZE) injection 50 mcg (50 mcg Intravenous Given 11/06/22 1800)  dicyclomine (BENTYL) capsule 10 mg (10 mg Oral Given 11/06/22 1945)  fentaNYL (SUBLIMAZE) injection 50 mcg (50 mcg Intravenous Given 11/06/22 2151)  morphine (PF) 4 MG/ML injection 4 mg (4 mg Intravenous Given 11/07/22  0337)    Reassessment after intervention: No active bleeding.   I decided to review pertinent External Data, and in summary patient with admit earlier this month with PRBC transfusion.   Clinical Laboratory Tests Ordered, included hemoccult positive. CBC improved from recent admit. CMP consistent with ESRD. Low normal K.    Cardiac Monitor Tracing which shows NSR.    Social Determinants of Health Risk patient is not an active smoker.   Medical Decision Making: Summary:  Patient presents to the emergency department with GI  bleeding.  History of similar earlier this month.  Nonbleeding gastric ulcers noted on EGD at that time with clipping of one area.   Reevaluation with update and discussion with patient. Hemoccult positive. Plan for repeat H/H and CT pending. Care transferred to Dr. Roxanne Mins.   Considered admission but H/H improved from prior. Plan for labs and CT.   Patient's presentation is most consistent with acute presentation with potential threat to life or bodily function.   Disposition: pending  ____________________________________________  FINAL CLINICAL IMPRESSION(S) / ED DIAGNOSES  Final diagnoses:  Proctitis  Rectal bleeding  End-stage renal disease on hemodialysis ([place])  Anemia associated with chronic renal failure  Elevated blood pressure reading with diagnosis of hypertension     NEW OUTPATIENT MEDICATIONS STARTED DURING THIS VISIT:  Discharge Medication List as of 11/07/2022  5:11 AM     START taking these medications   Details  hydrocortisone-pramoxine (PROCTOFOAM-HC) rectal foam Place 1 applicator rectally 2 (two) times daily., Starting Sat 11/07/2022, Normal        Note:  This document was prepared using Dragon voice recognition software and may include unintentional dictation errors.  Nanda Quinton, MD, [facility]    Kathy Frank, Kathy Olds, MD 11/07/22 [phone]

## 2022-11-07 ENCOUNTER — Emergency Department (HOSPITAL_COMMUNITY): Payer: 59

## 2022-11-07 ENCOUNTER — Other Ambulatory Visit (HOSPITAL_COMMUNITY): Payer: 59

## 2022-11-07 DIAGNOSIS — R109 Unspecified abdominal pain: Secondary | ICD-10-CM | POA: Diagnosis not present

## 2022-11-07 LAB — HEMOGLOBIN AND HEMATOCRIT, BLOOD
HCT: 31 % — ABNORMAL LOW (ref 36.0–46.0)
Hemoglobin: 9.8 g/dL — ABNORMAL LOW (ref 12.0–15.0)

## 2022-11-07 LAB — I-STAT BETA HCG BLOOD, ED (MC, WL, AP ONLY): I-stat hCG, quantitative: <5 m[IU]/mL (ref <5)

## 2022-11-07 MED ORDER — MORPHINE SULFATE (PF) 4 MG/ML IV SOLN
4.0000 mg | Freq: Once | INTRAVENOUS | Status: AC
  Administered 2022-11-07: 4 mg via INTRAVENOUS
  Filled 2022-11-07: qty 1

## 2022-11-07 MED ORDER — HYDROCORT-PRAMOXINE (PERIANAL) 1-1 % EX FOAM: 1.0000 | Freq: Two times a day (BID) | CUTANEOUS | 1 refills | Status: DC

## 2022-11-07 NOTE — ED Provider Notes (Signed)
Care assumed from Dr. Laverta Baltimore, patient with abdominal pain and diarrhea with blood present, CT abdomen and pelvis is pending. Also, repeat hemoglobin pending.  Repeat hemoglobin has actually risen, and appears to be at her baseline.  No evidence of significant amount of GI blood loss.  CT of abdomen and pelvis shows changes suggestive of proctitis.  I have independently viewed the images, and agree with radiologist interpretation.  I am discharging the patient with prescription for Proctofoam HC to use twice a day.  She will need to follow-up with gastroenterology, but recommended she follow-up with her primary care provider in 1 week.  Return precautions discussed.  Results for orders placed or performed during the hospital encounter of 11/06/22  Comprehensive metabolic panel  Result Value Ref Range   Sodium 135 135 - 145 mmol/L   Potassium 3.4 (L) 3.5 - 5.1 mmol/L   Chloride 96 (L) 98 - 111 mmol/L   CO2 22 22 - 32 mmol/L   Glucose, Bld 234 (H) 70 - 99 mg/dL   BUN 27 (H) 6 - 20 mg/dL   Creatinine, Ser 6.90 (H) 0.44 - 1.00 mg/dL   Calcium 8.4 (L) 8.9 - 10.3 mg/dL   Total Protein 8.1 6.5 - 8.1 g/dL   Albumin 3.0 (L) 3.5 - 5.0 g/dL   AST 12 (L) 15 - 41 U/L   ALT 10 0 - 44 U/L   Alkaline Phosphatase 58 38 - 126 U/L   Total Bilirubin 0.5 0.3 - 1.2 mg/dL   GFR, Estimated 7 (L) >60 mL/min   Anion gap 17 (H) 5 - 15  CBC  Result Value Ref Range   WBC 5.7 4.0 - 10.5 K/uL   RBC 3.17 (L) 3.87 - 5.11 MIL/uL   Hemoglobin 9.2 (L) 12.0 - 15.0 g/dL   HCT 30.5 (L) 36.0 - 46.0 %   MCV 96.2 80.0 - 100.0 fL   MCH 29.0 26.0 - 34.0 pg   MCHC 30.2 30.0 - 36.0 g/dL   RDW 16.6 (H) 11.5 - 15.5 %   Platelets 341 150 - 400 K/uL   nRBC 0.0 0.0 - 0.2 %  Protime-INR  Result Value Ref Range   Prothrombin Time 15.0 11.4 - 15.2 seconds   INR 1.2 0.8 - 1.2  Hemoglobin and hematocrit, blood  Result Value Ref Range   Hemoglobin 9.8 (L) 12.0 - 15.0 g/dL   HCT 31.0 (L) 36.0 - 46.0 %  POC occult blood, ED  Result  Value Ref Range   Fecal Occult Bld POSITIVE (A) NEGATIVE  I-Stat beta hCG blood, ED  Result Value Ref Range   I-stat hCG, quantitative <5.0 <5 mIU/mL   Comment 3          Type and screen [place]  Result Value Ref Range   ABO/RH(D) B POS    Antibody Screen NEG    Sample Expiration      11/09/2022,2359 Performed at [facility], [address] [place], [place] [address]    CT ABDOMEN PELVIS WO CONTRAST  Result Date: 11/07/2022 CLINICAL DATA:  Acute, nonlocalized abdominal pain EXAM: CT ABDOMEN AND PELVIS WITHOUT CONTRAST TECHNIQUE: Multidetector CT imaging of the abdomen and pelvis was performed following the standard protocol without IV contrast. RADIATION DOSE REDUCTION: This exam was performed according to the departmental dose-optimization program which includes automated exposure control, adjustment of the mA and/or kV according to patient size and/or use of iterative reconstruction technique. COMPARISON:  12/31/2018 FINDINGS: Lower chest: Atelectasis or  scarring at the lung bases. Coronary atherosclerosis. Hepatobiliary: No focal liver abnormality.No evidence of biliary obstruction or stone. Pancreas: Unremarkable. Spleen: Unremarkable. Adrenals/Urinary Tract: Negative adrenals. No hydronephrosis or stone. Symmetric renal atrophy. Unremarkable bladder. Stomach/Bowel: Perirectal edema without certain wall thickening. No bowel obstruction. No appendicitis. Vascular/Lymphatic: No acute vascular abnormality. Severe and confluent atheromatous calcification of the aorta and branch vessels. No mass or adenopathy. Reproductive:No pathologic findings. Other: No ascites or pneumoperitoneum. Musculoskeletal: No acute abnormalities. IMPRESSION: 1. Perirectal edema favoring proctitis over third-spacing. 2. Progressive renal atrophy and atherosclerosis since 2020. Electronically Signed   By: Jorje Guild M.D.   On: 11/07/2022 04:20   IR Removal Tun Cv Cath W/O FL  Result  Date: 10/17/2022 INDICATION: 47 year old female with tunneled central venous catheter placement for inpatient use. No longer needed. Request made for removal. EXAM: REMOVAL TUNNELED CENTRAL VENOUS CATHETER MEDICATIONS: None ANESTHESIA/SEDATION: None FLUOROSCOPY TIME:  None COMPLICATIONS: None immediate. PROCEDURE: Informed written consent was obtained from the patient after a thorough discussion of the procedural risks, benefits and alternatives. All questions were addressed. Maximal Sterile Barrier Technique was utilized including caps, mask, sterile gowns, sterile gloves, sterile drape, hand hygiene and skin antiseptic. A timeout was performed prior to the initiation of the procedure. The patient's right chest and catheter was prepped and draped in a normal sterile fashion. Heparin was removed from both ports of catheter. 1% lidocaine was used for local anesthesia. Using gentle blunt dissection the cuff of the catheter was exposed and the catheter was removed in it's entirety. Pressure was held till hemostasis was obtained. A sterile dressing was applied. The patient tolerated the procedure well with no immediate complications. IMPRESSION: Successful catheter removal as described above. Read by: Brynda Greathouse PA-C Electronically Signed   By: Jacqulynn Cadet M.D.   On: 10/17/2022 05:02   IR US Guide Vasc Access Right  Result Date: 10/13/2022 INDICATION: no IV access, need central access.  ESRD patient. EXAM: ULTRASOUND AND FLUOROSCOPIC GUIDED PLACEMENT OF TUNNELED CENTRAL VENOUS CATHETER MEDICATIONS: None. ANESTHESIA/SEDATION: Local anesthetic was administered. FLUOROSCOPY TIME:  Fluoroscopic dose; 5 mGy COMPLICATIONS: None immediate. PROCEDURE: Informed written consent was obtained from the patient and/or patient's representative after a discussion of the risks, benefits, and alternatives to treatment. Questions regarding the procedure were encouraged and answered. The RIGHT neck and chest were prepped with  chlorhexidine in a sterile fashion, and a sterile drape was applied covering the operative field. Maximum barrier sterile technique with sterile gowns and gloves were used for the procedure. A timeout was performed prior to the initiation of the procedure. After the overlying soft tissues were anesthetized, a small venotomy incision was created and a micropuncture kit was utilized to access the internal jugular vein. Real-time ultrasound guidance was utilized for vascular access including the acquisition of a permanent ultrasound image documenting patency of the accessed vessel. The microwire was utilized to measure appropriate catheter length. The micropuncture sheath was exchanged for a peel-away sheath over a guidewire. A 5 Fr dual lumen tunneled central venous catheter measuring 25 cm was tunneled in a retrograde fashion from the anterior chest wall to the venotomy incision. The catheter was then placed through the peel-away sheath with tip ultimately positioned at the superior caval-atrial junction. Final catheter positioning was confirmed and documented with a spot radiographic image. The catheter aspirates and flushes normally. The catheter was flushed with appropriate volume heparin dwells. The catheter exit site was secured with a 2-0 nylon retention suture. The venotomy incision was closed  with Dermabond. Dressings were applied. The patient tolerated the procedure well without immediate post procedural complication. FINDINGS: After catheter placement, the tip lies within the superior apect of the right atrium. The catheter aspirates and flushes normally and is ready for immediate use. IMPRESSION: Successful placement of 25 cm dual lumen tunneled central venous "Powerline" catheter via the RIGHT internal jugular vein The tip of the catheter is positioned within the proximal RIGHT atrium. The catheter is ready for immediate use. Michaelle Birks, MD [facility]  Radiology Electronically Signed   By: Michaelle Birks M.D.   On: 10/13/2022 13:16   IR Fluoro Guide CV Line Right  Result Date: 10/13/2022 INDICATION: no IV access, need central access.  ESRD patient. EXAM: ULTRASOUND AND FLUOROSCOPIC GUIDED PLACEMENT OF TUNNELED CENTRAL VENOUS CATHETER MEDICATIONS: None. ANESTHESIA/SEDATION: Local anesthetic was administered. FLUOROSCOPY TIME:  Fluoroscopic dose; 5 mGy COMPLICATIONS: None immediate. PROCEDURE: Informed written consent was obtained from the patient and/or patient's representative after a discussion of the risks, benefits, and alternatives to treatment. Questions regarding the procedure were encouraged and answered. The RIGHT neck and chest were prepped with chlorhexidine in a sterile fashion, and a sterile drape was applied covering the operative field. Maximum barrier sterile technique with sterile gowns and gloves were used for the procedure. A timeout was performed prior to the initiation of the procedure. After the overlying soft tissues were anesthetized, a small venotomy incision was created and a micropuncture kit was utilized to access the internal jugular vein. Real-time ultrasound guidance was utilized for vascular access including the acquisition of a permanent ultrasound image documenting patency of the accessed vessel. The microwire was utilized to measure appropriate catheter length. The micropuncture sheath was exchanged for a peel-away sheath over a guidewire. A 5 Fr dual lumen tunneled central venous catheter measuring 25 cm was tunneled in a retrograde fashion from the anterior chest wall to the venotomy incision. The catheter was then placed through the peel-away sheath with tip ultimately positioned at the superior caval-atrial junction. Final catheter positioning was confirmed and documented with a spot radiographic image. The catheter aspirates and flushes normally. The catheter was flushed with appropriate volume heparin dwells. The catheter  exit site was secured with a 2-0 nylon retention suture. The venotomy incision was closed with Dermabond. Dressings were applied. The patient tolerated the procedure well without immediate post procedural complication. FINDINGS: After catheter placement, the tip lies within the superior apect of the right atrium. The catheter aspirates and flushes normally and is ready for immediate use. IMPRESSION: Successful placement of 25 cm dual lumen tunneled central venous "Powerline" catheter via the RIGHT internal jugular vein The tip of the catheter is positioned within the proximal RIGHT atrium. The catheter is ready for immediate use. Michaelle Birks, MD [facility] Special[facility] Radiology Electronically Signed   By: Michaelle Birks M.D.   On: 10/13/2022 13:16   CT HEAD WO CONTRAST (5MM)  Result Date: 10/13/2022 CLINICAL DATA:  Altered mental status EXAM: CT HEAD WITHOUT CONTRAST TECHNIQUE: Contiguous axial images were obtained from the base of the skull through the vertex without intravenous contrast. RADIATION DOSE REDUCTION: This exam was performed according to the departmental dose-optimization program which includes automated exposure control, adjustment of the mA and/or kV according to patient size and/or use of iterative reconstruction technique. COMPARISON:  CT Brain 09/15/22 FINDINGS: Motion degraded exam Brain: No hemorrhage. No CT evidence of an acute cortical infarct. Redemonstrated asymmetric hypodensity in the periventricular white matter  on right. No hydrocephalus. No CT evidence of an acute cortical infarct. Cavum septum pellucidum et vergae. Vascular: There are are diffuse atherosclerotic vascular calcifications in keeping with the patient's history of diabetes. Skull: Normal. Negative for fracture or focal lesion. Sinuses/Orbits: No mastoid or middle ear effusion. Mild mucosal thickening in the left sphenoid sinus with focal occlusion1 of the left sphenoethmoidal recess.  Bilateral orbits are unremarkable. There are small punctate radiodensities in the subcutaneous soft tissues overlying the nasal bridge, favored to represent dermal calcifications and less likely foreign bodies. Other: None IMPRESSION: No hemorrhage or CT evidence of an acute cortical infarct. There may be a possible age indeterminate infarct in the left thalamus. Electronically Signed   By: [place] M.D.   On: 10/13/2022 13:08   DG Chest Portable 1 View  Result Date: 10/12/2022 CLINICAL DATA:  Chest pain and shortness of breath. EXAM: PORTABLE CHEST 1 VIEW COMPARISON:  10/03/2022 FINDINGS: Heart size is upper limits of normal but similar to the previous examination. No overt pulmonary edema. No focal airspace disease. Atherosclerotic calcifications at the aortic arch. Trachea is midline. Negative for a pneumothorax. IMPRESSION: 1. No acute cardiopulmonary disease. 2. Aortic atherosclerosis. Electronically Signed   By: Markus Daft M.D.   On: [phone]      Delora Fuel, MD [phone]

## 2022-11-07 NOTE — Discharge Instructions (Addendum)
Please use the steroid rectal applicator twice a day.  Please make sure to follow-up with the gastroenterologist.  Make sure that you continue to go for your scheduled dialysis sessions.  Return to the emergency department if bleeding seems to be getting worse.

## 2022-11-08 DIAGNOSIS — E1129 Type 2 diabetes mellitus with other diabetic kidney complication: Secondary | ICD-10-CM | POA: Diagnosis not present

## 2022-11-08 DIAGNOSIS — N186 End stage renal disease: Secondary | ICD-10-CM | POA: Diagnosis not present

## 2022-11-08 DIAGNOSIS — Z992 Dependence on renal dialysis: Secondary | ICD-10-CM | POA: Diagnosis not present

## 2022-11-09 DIAGNOSIS — N186 End stage renal disease: Secondary | ICD-10-CM | POA: Diagnosis not present

## 2022-11-10 DIAGNOSIS — D631 Anemia in chronic kidney disease: Secondary | ICD-10-CM | POA: Diagnosis not present

## 2022-11-10 DIAGNOSIS — R519 Headache, unspecified: Secondary | ICD-10-CM | POA: Diagnosis not present

## 2022-11-10 DIAGNOSIS — N2581 Secondary hyperparathyroidism of renal origin: Secondary | ICD-10-CM | POA: Diagnosis not present

## 2022-11-10 DIAGNOSIS — Z992 Dependence on renal dialysis: Secondary | ICD-10-CM | POA: Diagnosis not present

## 2022-11-10 DIAGNOSIS — N186 End stage renal disease: Secondary | ICD-10-CM | POA: Diagnosis not present

## 2022-11-10 MED ORDER — PENTIPS 32G X 4 MM MISC: 3 refills | Status: DC

## 2022-11-11 ENCOUNTER — Telehealth: Payer: Self-pay

## 2022-11-11 NOTE — Telephone Encounter (Signed)
     Patient  visit on 11/07/2022  at [facility]. [facility] was for Blood in stool.  Have you been able to follow up with your primary care physician? Patient followed up with her GI physician.  The patient was or was not able to obtain any needed medicine or equipment. No medication prescribed.  Are there diet recommendations that you are having difficulty following? No  Patient expresses understanding of discharge instructions and education provided has no other needs at this time. Yes  Verified mailing address to send [employer] transportation application.   [place] Resource Care Guide   [email or web address]  ?? [id number]   Website: [email or web address]  [email or web address]

## 2022-11-12 DIAGNOSIS — N186 End stage renal disease: Secondary | ICD-10-CM | POA: Diagnosis not present

## 2022-11-12 DIAGNOSIS — N2581 Secondary hyperparathyroidism of renal origin: Secondary | ICD-10-CM | POA: Diagnosis not present

## 2022-11-12 DIAGNOSIS — Z992 Dependence on renal dialysis: Secondary | ICD-10-CM | POA: Diagnosis not present

## 2022-11-12 DIAGNOSIS — D631 Anemia in chronic kidney disease: Secondary | ICD-10-CM | POA: Diagnosis not present

## 2022-11-12 DIAGNOSIS — R519 Headache, unspecified: Secondary | ICD-10-CM | POA: Diagnosis not present

## 2022-11-13 ENCOUNTER — Ambulatory Visit (INDEPENDENT_AMBULATORY_CARE_PROVIDER_SITE_OTHER): Payer: 59 | Admitting: Student

## 2022-11-13 DIAGNOSIS — Z992 Dependence on renal dialysis: Secondary | ICD-10-CM | POA: Diagnosis not present

## 2022-11-13 DIAGNOSIS — Z993 Dependence on wheelchair: Secondary | ICD-10-CM | POA: Insufficient documentation

## 2022-11-13 DIAGNOSIS — Z89611 Acquired absence of right leg above knee: Secondary | ICD-10-CM

## 2022-11-13 DIAGNOSIS — Z89612 Acquired absence of left leg above knee: Secondary | ICD-10-CM | POA: Diagnosis not present

## 2022-11-13 DIAGNOSIS — T879 Unspecified complications of amputation stump: Secondary | ICD-10-CM

## 2022-11-13 NOTE — Assessment & Plan Note (Signed)
Telehealth visit today as patient requesting electric wheelchair. She is a bilateral lower extremity amputee and has been using a manual wheelchair since. However, with her having a dialysis fistula in her arm it can be difficult for her to operate a manual wheelchair. Will refer to neurorehab for further evaluation and need for electric wheelchair

## 2022-11-13 NOTE — Progress Notes (Signed)
   CC: need for electric wheelchair  This is a telephone encounter between [employer] and [employer] on 11/13/2022 for request for electric wheelchair. The visit was conducted with the patient located at home and Thalia Bloodgood at [facility]. The patient's identity was confirmed using their DOB and current address. The patient has consented to being evaluated through a telephone encounter and understands the associated risks (an examination cannot be done and the patient may need to come in for an appointment) / benefits (allows the patient to remain at home, decreasing exposure to coronavirus). I personally spent 11 minutes on medical discussion.   HPI:  [name] is a 47 y.o. with PMH as below.   Please see A&P for assessment of the patient's acute and chronic medical conditions.   Past Medical History:  Diagnosis Date   Anal abscess    chronic   Anxiety    CAD (coronary artery disease)    CVA (cerebral vascular accident) (HCC)    Depression 06/28/2006   Qualifier: Diagnosis of  By: Laveda Abbe MD, Todd     Diabetes mellitus type 2 in obese (HCC) 06/28/1994   Dyspnea    uses oxygeb 2 liters per minute at dialysis   End stage renal disease on dialysis [facility])    on hemodialysis T/Th/Sat   Erosive esophagitis    Esophageal reflux    Eye redness    Gastroparesis    ? diabetic   GERD (gastroesophageal reflux disease)    History of blood transfusion 2019   Human immunodeficiency virus (HIV) disease (HCC) 04/23/2016   Hyperlipidemia    Hypertension    Metabolic bone disease 05/02/2019   Moderate nonproliferative diabetic retinopathy of both eyes (HCC) 11/21/2014   11/14/14: Noted on retinal imaging; needs follow-up imaging in 6 months  05/22/16: Noted on retinal imaging again; needs follow-up imaging in 6 months   PAD (peripheral artery disease) (HCC)    Type 2 diabetes mellitus with diabetic peripheral angiopathy without gangrene (HCC) 05/01/2019   Wears dentures     lower   Wound infection s/p L transmetatarsal amputation    Review of Systems:   + bilateral lower extremity amputee    Assessment & Plan:   Dependence on wheelchair Telehealth visit today as patient requesting electric wheelchair. She is a bilateral lower extremity amputee and has been using a manual wheelchair since. However, with her having a dialysis fistula in her arm it can be difficult for her to operate a manual wheelchair. Will refer to neurorehab for further evaluation and need for electric wheelchair   Patient discussed with Dr. Girtha Rm Internal Medicine Resident

## 2022-11-16 DIAGNOSIS — I517 Cardiomegaly: Secondary | ICD-10-CM | POA: Diagnosis not present

## 2022-11-16 DIAGNOSIS — N186 End stage renal disease: Secondary | ICD-10-CM | POA: Diagnosis not present

## 2022-11-16 DIAGNOSIS — E8779 Other fluid overload: Secondary | ICD-10-CM | POA: Diagnosis not present

## 2022-11-16 DIAGNOSIS — I10 Essential (primary) hypertension: Secondary | ICD-10-CM | POA: Diagnosis not present

## 2022-11-16 DIAGNOSIS — E8729 Other acidosis: Secondary | ICD-10-CM | POA: Diagnosis not present

## 2022-11-16 DIAGNOSIS — I120 Hypertensive chronic kidney disease with stage 5 chronic kidney disease or end stage renal disease: Secondary | ICD-10-CM | POA: Diagnosis not present

## 2022-11-16 DIAGNOSIS — D631 Anemia in chronic kidney disease: Secondary | ICD-10-CM | POA: Diagnosis not present

## 2022-11-16 DIAGNOSIS — E8770 Fluid overload, unspecified: Secondary | ICD-10-CM | POA: Diagnosis not present

## 2022-11-16 DIAGNOSIS — Z992 Dependence on renal dialysis: Secondary | ICD-10-CM | POA: Diagnosis not present

## 2022-11-16 DIAGNOSIS — E872 Acidosis, unspecified: Secondary | ICD-10-CM | POA: Diagnosis not present

## 2022-11-16 DIAGNOSIS — E1122 Type 2 diabetes mellitus with diabetic chronic kidney disease: Secondary | ICD-10-CM | POA: Diagnosis not present

## 2022-11-16 DIAGNOSIS — Z79899 Other long term (current) drug therapy: Secondary | ICD-10-CM | POA: Diagnosis not present

## 2022-11-16 DIAGNOSIS — R0602 Shortness of breath: Secondary | ICD-10-CM | POA: Diagnosis not present

## 2022-11-16 DIAGNOSIS — N2589 Other disorders resulting from impaired renal tubular function: Secondary | ICD-10-CM | POA: Diagnosis not present

## 2022-11-16 DIAGNOSIS — I700 Atherosclerosis of aorta: Secondary | ICD-10-CM | POA: Diagnosis not present

## 2022-11-16 NOTE — Progress Notes (Signed)
Internal Medicine Clinic Attending  Case discussed with Dr. Katsadouros  at the time of the visit.  We reviewed the resident's history and pertinent patient test results.  I agree with the assessment, diagnosis, and plan of care documented in the resident's note.  

## 2022-11-17 DIAGNOSIS — E872 Acidosis, unspecified: Secondary | ICD-10-CM | POA: Diagnosis not present

## 2022-11-17 DIAGNOSIS — Z992 Dependence on renal dialysis: Secondary | ICD-10-CM | POA: Diagnosis not present

## 2022-11-17 DIAGNOSIS — I120 Hypertensive chronic kidney disease with stage 5 chronic kidney disease or end stage renal disease: Secondary | ICD-10-CM | POA: Diagnosis not present

## 2022-11-17 DIAGNOSIS — N2589 Other disorders resulting from impaired renal tubular function: Secondary | ICD-10-CM | POA: Diagnosis not present

## 2022-11-17 DIAGNOSIS — E8770 Fluid overload, unspecified: Secondary | ICD-10-CM | POA: Diagnosis not present

## 2022-11-17 DIAGNOSIS — D631 Anemia in chronic kidney disease: Secondary | ICD-10-CM | POA: Diagnosis not present

## 2022-11-17 DIAGNOSIS — R0602 Shortness of breath: Secondary | ICD-10-CM | POA: Diagnosis not present

## 2022-11-17 DIAGNOSIS — I10 Essential (primary) hypertension: Secondary | ICD-10-CM | POA: Diagnosis not present

## 2022-11-17 DIAGNOSIS — N186 End stage renal disease: Secondary | ICD-10-CM | POA: Diagnosis not present

## 2022-11-23 ENCOUNTER — Other Ambulatory Visit: Payer: Self-pay

## 2022-11-23 DIAGNOSIS — Z992 Dependence on renal dialysis: Secondary | ICD-10-CM | POA: Diagnosis not present

## 2022-11-23 DIAGNOSIS — N186 End stage renal disease: Secondary | ICD-10-CM | POA: Diagnosis not present

## 2022-11-23 NOTE — Telephone Encounter (Signed)
Maximum monthly dose is 8 tablets I can send a 3 month supply but patient need to be aware she can't take it everyday.

## 2022-11-24 MED ORDER — UBRELVY 50 MG PO TABS: 50.0000 mg | ORAL_TABLET | ORAL | 3 refills | Status: DC | PRN

## 2022-11-25 DIAGNOSIS — Z992 Dependence on renal dialysis: Secondary | ICD-10-CM | POA: Diagnosis not present

## 2022-11-25 DIAGNOSIS — N186 End stage renal disease: Secondary | ICD-10-CM | POA: Diagnosis not present

## 2022-11-27 DIAGNOSIS — N186 End stage renal disease: Secondary | ICD-10-CM | POA: Diagnosis not present

## 2022-11-27 DIAGNOSIS — Z992 Dependence on renal dialysis: Secondary | ICD-10-CM | POA: Diagnosis not present

## 2022-11-30 DIAGNOSIS — I120 Hypertensive chronic kidney disease with stage 5 chronic kidney disease or end stage renal disease: Secondary | ICD-10-CM | POA: Diagnosis not present

## 2022-11-30 DIAGNOSIS — T82838A Hemorrhage of vascular prosthetic devices, implants and grafts, initial encounter: Secondary | ICD-10-CM | POA: Diagnosis not present

## 2022-11-30 DIAGNOSIS — Z992 Dependence on renal dialysis: Secondary | ICD-10-CM | POA: Diagnosis not present

## 2022-11-30 DIAGNOSIS — T82510A Breakdown (mechanical) of surgically created arteriovenous fistula, initial encounter: Secondary | ICD-10-CM | POA: Diagnosis not present

## 2022-11-30 DIAGNOSIS — Z89512 Acquired absence of left leg below knee: Secondary | ICD-10-CM | POA: Diagnosis not present

## 2022-11-30 DIAGNOSIS — Z794 Long term (current) use of insulin: Secondary | ICD-10-CM | POA: Diagnosis not present

## 2022-11-30 DIAGNOSIS — I25110 Atherosclerotic heart disease of native coronary artery with unstable angina pectoris: Secondary | ICD-10-CM | POA: Diagnosis not present

## 2022-11-30 DIAGNOSIS — D631 Anemia in chronic kidney disease: Secondary | ICD-10-CM | POA: Diagnosis not present

## 2022-11-30 DIAGNOSIS — E1122 Type 2 diabetes mellitus with diabetic chronic kidney disease: Secondary | ICD-10-CM | POA: Diagnosis not present

## 2022-11-30 DIAGNOSIS — R079 Chest pain, unspecified: Secondary | ICD-10-CM | POA: Diagnosis not present

## 2022-11-30 DIAGNOSIS — I252 Old myocardial infarction: Secondary | ICD-10-CM | POA: Diagnosis not present

## 2022-11-30 DIAGNOSIS — N186 End stage renal disease: Secondary | ICD-10-CM | POA: Diagnosis not present

## 2022-11-30 DIAGNOSIS — T82590A Other mechanical complication of surgically created arteriovenous fistula, initial encounter: Secondary | ICD-10-CM | POA: Diagnosis not present

## 2022-11-30 DIAGNOSIS — R58 Hemorrhage, not elsewhere classified: Secondary | ICD-10-CM | POA: Diagnosis not present

## 2022-11-30 DIAGNOSIS — I1311 Hypertensive heart and chronic kidney disease without heart failure, with stage 5 chronic kidney disease, or end stage renal disease: Secondary | ICD-10-CM | POA: Diagnosis not present

## 2022-11-30 DIAGNOSIS — I770 Arteriovenous fistula, acquired: Secondary | ICD-10-CM | POA: Diagnosis not present

## 2022-11-30 DIAGNOSIS — Z89511 Acquired absence of right leg below knee: Secondary | ICD-10-CM | POA: Diagnosis not present

## 2022-11-30 DIAGNOSIS — Z7401 Bed confinement status: Secondary | ICD-10-CM | POA: Diagnosis not present

## 2022-12-01 ENCOUNTER — Other Ambulatory Visit (HOSPITAL_COMMUNITY): Payer: Self-pay

## 2022-12-01 DIAGNOSIS — T82590A Other mechanical complication of surgically created arteriovenous fistula, initial encounter: Secondary | ICD-10-CM | POA: Diagnosis not present

## 2022-12-01 DIAGNOSIS — I770 Arteriovenous fistula, acquired: Secondary | ICD-10-CM | POA: Diagnosis not present

## 2022-12-01 DIAGNOSIS — N186 End stage renal disease: Secondary | ICD-10-CM | POA: Diagnosis not present

## 2022-12-01 DIAGNOSIS — T82510A Breakdown (mechanical) of surgically created arteriovenous fistula, initial encounter: Secondary | ICD-10-CM | POA: Diagnosis not present

## 2022-12-02 DIAGNOSIS — N186 End stage renal disease: Secondary | ICD-10-CM | POA: Diagnosis not present

## 2022-12-02 DIAGNOSIS — Z992 Dependence on renal dialysis: Secondary | ICD-10-CM | POA: Diagnosis not present

## 2022-12-02 DIAGNOSIS — T82510A Breakdown (mechanical) of surgically created arteriovenous fistula, initial encounter: Secondary | ICD-10-CM | POA: Diagnosis not present

## 2022-12-04 ENCOUNTER — Telehealth: Payer: Self-pay

## 2022-12-04 DIAGNOSIS — N186 End stage renal disease: Secondary | ICD-10-CM | POA: Diagnosis not present

## 2022-12-04 DIAGNOSIS — Z992 Dependence on renal dialysis: Secondary | ICD-10-CM | POA: Diagnosis not present

## 2022-12-04 NOTE — Transitions of Care (Post Inpatient/ED Visit) (Signed)
   12/04/2022  Name: Kathy Frank MRN: [id number] DOB: Nov 11, 1975  Today's TOC FU Call Status: Today's TOC FU Call Status:: Successful TOC FU Call Competed TOC FU Call Complete Date: 12/04/22  Transition Care Management Follow-up Telephone Call Date of Discharge: 12/03/22 Discharge Facility: Other ([facility] Facility) Name of Other ([facility]) Discharge Facility: [facility] [place] [facility] Type of Discharge: Inpatient Admission Primary Inpatient Discharge Diagnosis:: Pseudoaneurysm of arteriovenous dialysis fistula How have you been since you were released from the hospital?: Better Any questions or concerns?: No  Items Reviewed: Did you receive and understand the discharge instructions provided?: Yes Medications obtained and verified?: Yes (Medications Reviewed) Any new allergies since your discharge?: No Dietary orders reviewed?: No Do you have support at home?: Yes People in Home: parent(s) Name of Support/Comfort Primary Source: Kathy Frank  Home Care and Equipment/Supplies: Were Home Health Services Ordered?: No Any new equipment or medical supplies ordered?: No  Functional Questionnaire: Do you need assistance with bathing/showering or dressing?: Yes (just to keep arm dry) Do you need assistance with meal preparation?: No Do you need assistance with eating?: No Do you have difficulty maintaining continence: No Do you need assistance with getting out of bed/getting out of a chair/moving?: No Do you have difficulty managing or taking your medications?: No  Follow up appointments reviewed: PCP Follow-up appointment confirmed?: No MD Provider Line [phone] Given: No (Patient following up in [place], will be back in [place] in a few weeks) Specialist Hospital Follow-up appointment confirmed?: NA Do you need transportation to your follow-up appointment?: No Do you understand care options if your condition(s) worsen?: Yes-patient verbalized understanding  SDOH  Interventions Today    Flowsheet Row Most Recent Value  SDOH Interventions   Food Insecurity Interventions Intervention Not Indicated  Utilities Interventions Intervention Not Indicated      Interventions Today    Flowsheet Row Most Recent Value  Chronic Disease   Chronic disease during today's visit Chronic Kidney Disease/End Stage Renal Disease (ESRD)      Patient will not be back from [place] for her 12/16/22 Neuro Rehab appointment, called her back to let her know of appointment and provided the phone number for her to call and reschedule. [name], RN, BSN, CCM Care Management Coordinator [facility] Phone: [phone]/Fax: [phone]

## 2022-12-07 ENCOUNTER — Other Ambulatory Visit: Payer: Self-pay

## 2022-12-09 DIAGNOSIS — N186 End stage renal disease: Secondary | ICD-10-CM | POA: Diagnosis not present

## 2022-12-09 DIAGNOSIS — Z992 Dependence on renal dialysis: Secondary | ICD-10-CM | POA: Diagnosis not present

## 2022-12-11 DIAGNOSIS — Z992 Dependence on renal dialysis: Secondary | ICD-10-CM | POA: Diagnosis not present

## 2022-12-11 DIAGNOSIS — N186 End stage renal disease: Secondary | ICD-10-CM | POA: Diagnosis not present

## 2022-12-14 DIAGNOSIS — N186 End stage renal disease: Secondary | ICD-10-CM | POA: Diagnosis not present

## 2022-12-14 DIAGNOSIS — Z992 Dependence on renal dialysis: Secondary | ICD-10-CM | POA: Diagnosis not present

## 2022-12-16 ENCOUNTER — Ambulatory Visit: Payer: 59 | Admitting: Physical Therapy

## 2022-12-18 DIAGNOSIS — Z992 Dependence on renal dialysis: Secondary | ICD-10-CM | POA: Diagnosis not present

## 2022-12-18 DIAGNOSIS — N186 End stage renal disease: Secondary | ICD-10-CM | POA: Diagnosis not present

## 2022-12-19 NOTE — Progress Notes (Deleted)
Cardiology Clinic Note    Date: 10/30/2022 ID: Kathy Frank, Kathy Frank November 25, 1975, MRN [id number]   Primary Cardiologist:  Thomasene Ripple, DO   Patient Profile    Kathy Frank is a 47 y.o. female who presents to the clinic today for evaluation of chest pain***.   Past medical history significant for: CAD. LHC 05/21/2021 (NSTEMI): Mid LAD 75%.  Proximal RCA 35%.  Distal RCA 40%.  PCI with DES to mid LAD.  Recommendations for uninterrupted DAPT x 1 year. Palpitations. PAD. 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 Hypertension. Hyperlipidemia. Lipid panel 10/19/2022: LDL 38, HDL 47, TG 120, total 106. T2DM. CVA. HIV. Migraines. ESRD. HD TuThSa Gastric ulcers. Hospitalization 10/12/2022 to 10/16/2022 for upper GI bleed (secondary to NSAID use). Transfused 2 units.     History of Present Illness    Kathy Frank was first evaluated by Dr. Servando Salina on 05/21/2021 during hospitalization.  She presented to the emergency room via EMS on 05/20/2021 for complaints of chest pain, shortness of breath that began the night previous.  She ruled in for NSTEMI and underwent PCI with DES to mid LAD.    Patient was last seen in the office by Dr. Servando Salina 09/03/2021.  At that time she was doing well overall.  She complained of palpitations medications were switched from metoprolol to tartrate to propranolol and 2 weeks ZIO was ordered to rule out A-fib.  She was educated about diet modifications and reducing sodium in her diet.  Does not appear monitor was ever completed.  Patient has had several ED visits for complaints of hypertension, epistaxis, generalized weakness, migraine, and chest pain since being last seen in the office.   Patient presented to the emergency department via EMS from home on 10/12/2022 for complaints of shortness of breath, chest tightness, and headache.  Troponin was slightly elevated but flat.  She was admitted for GI bleed and transfused 2 units.  She was discharged on 10/16/2022.   Patient returned to the ER on 11/06/2022 with complaints of blood in stool.  She was Hemoccult positive.  Hemoglobin was somewhat improved from previous on 10/19/2022.  CT abdomen pelvis showed perirectal edema favoring proctitis over third spacing.  She was discharged from ER to follow-up with GI and PCP.  More recently patient was seen in the ED at [facility] in [place] for bleeding from her fistula following dialysis.  A stitch was placed but bleeding continued.  She was admitted and underwent revision of left upper extremity aVF with [employer].   Today, patient ***   CAD.  S/p PCI with DES mid LAD October 2022.  Patient *** Continue atorvastatin, aspirin. *** Palpitations. *** PAD.  S/p left AKA March 2020, right BKA June 2022. *** Hypertension.  BP today *** Patient *** Hyperlipidemia. LDL March 2024 38, at goal. Continue atorvastatin.  ESRD.  Recent revision of left upper extremity aVF performed at [facility] in [place] (while visiting family).  She continues hemodialysis TuThSa.      ROS: All other systems reviewed and are otherwise negative except as noted in History of Present Illness.   Studies Reviewed    ECG personally reviewed by me today: ***  No significant changes from ***   Risk Assessment/Calculations    {Does this patient have ATRIAL FIBRILLATION?:[id number]} No BP recorded.  {Refresh Note OR Click here to enter BP  :1}***         Physical Exam    VS:  There were no vitals taken for this visit. , BMI There is no height or weight on file to calculate BMI.   GEN: Well nourished, well developed, in no acute distress. Neck: No JVD or carotid bruits. Cardiac: *** RRR. No murmurs. No rubs or gallops.   Respiratory:  Respirations regular and unlabored. Clear to auscultation without rales, wheezing or rhonchi. GI: Soft, nontender, nondistended. Extremities: Radials/DP/PT 2+ and equal bilaterally. No clubbing or cyanosis.  No edema ***  Skin: Warm and dry, no rash. Neuro: Strength intact.   Assessment & Plan    ***   Disposition: ***      {Are you ordering a CV Procedure (e.g. stress test, cath, DCCV, TEE, etc)?   Press F2        :[id number]}    Signed, Etta Grandchild. Cristalle Rohm, DNP, NP-C

## 2022-12-21 ENCOUNTER — Ambulatory Visit: Payer: 59 | Attending: Student | Admitting: Student

## 2022-12-22 ENCOUNTER — Ambulatory Visit: Payer: 59 | Admitting: Internal Medicine

## 2022-12-22 ENCOUNTER — Ambulatory Visit: Payer: 59 | Admitting: Nurse Practitioner

## 2022-12-22 DIAGNOSIS — T8249XA Other complication of vascular dialysis catheter, initial encounter: Secondary | ICD-10-CM | POA: Diagnosis not present

## 2022-12-22 DIAGNOSIS — D689 Coagulation defect, unspecified: Secondary | ICD-10-CM | POA: Diagnosis not present

## 2022-12-22 DIAGNOSIS — Z992 Dependence on renal dialysis: Secondary | ICD-10-CM | POA: Diagnosis not present

## 2022-12-22 DIAGNOSIS — A499 Bacterial infection, unspecified: Secondary | ICD-10-CM | POA: Diagnosis not present

## 2022-12-22 DIAGNOSIS — I120 Hypertensive chronic kidney disease with stage 5 chronic kidney disease or end stage renal disease: Secondary | ICD-10-CM | POA: Diagnosis not present

## 2022-12-22 DIAGNOSIS — N2581 Secondary hyperparathyroidism of renal origin: Secondary | ICD-10-CM | POA: Diagnosis not present

## 2022-12-22 DIAGNOSIS — D631 Anemia in chronic kidney disease: Secondary | ICD-10-CM | POA: Diagnosis not present

## 2022-12-22 DIAGNOSIS — N186 End stage renal disease: Secondary | ICD-10-CM | POA: Diagnosis not present

## 2022-12-22 DIAGNOSIS — R197 Diarrhea, unspecified: Secondary | ICD-10-CM | POA: Diagnosis not present

## 2022-12-22 NOTE — Progress Notes (Deleted)
12/22/2022 CEANNA MATECKI [id number] Jan 22, 1976   Chief Complaint:  History of Present Illness: Santana C. Pirrello is a 47 year old female with a past medical history of anxiety, depression, coronary artery disease, CVA, ESRD on hemodialysis, HIV positive, GERD, GI bleed  She was seen by our inpatient GI service during her hospital admission due to having a GI bleed.     Latest Ref Rng & Units 11/07/2022    3:45 AM 11/06/2022    2:14 PM 10/19/2022   12:04 PM  CBC  WBC 4.0 - 10.5 K/uL  5.7  9.2   Hemoglobin 12.0 - 15.0 g/dL 9.8  9.2  9.2   Hematocrit 36.0 - 46.0 % 31.0  30.5  28.6   Platelets 150 - 400 K/uL  341  343         Latest Ref Rng & Units 11/06/2022    2:14 PM 10/16/2022    4:18 AM 10/15/2022    5:17 AM  CMP  Glucose 70 - 99 mg/dL 782  84  956   BUN 6 - 20 mg/dL 27  42  213   Creatinine 0.44 - 1.00 mg/dL 0.86  5.78  4.69   Sodium 135 - 145 mmol/L 135  137  138   Potassium 3.5 - 5.1 mmol/L 3.4  3.6  3.9   Chloride 98 - 111 mmol/L 96  93  97   CO2 22 - 32 mmol/L 22  28  21    Calcium 8.9 - 10.3 mg/dL 8.4  8.4  7.8   Total Protein 6.5 - 8.1 g/dL 8.1     Total Bilirubin 0.3 - 1.2 mg/dL 0.5     Alkaline Phos 38 - 126 U/L 58     AST 15 - 41 U/L 12     ALT 0 - 44 U/L 10        Past Medical History:  Diagnosis Date   Anal abscess    chronic   Anxiety    CAD (coronary artery disease)    CVA (cerebral vascular accident) (HCC)    Depression 06/28/2006   Qualifier: Diagnosis of  By: Laveda Abbe MD, Todd     Diabetes mellitus type 2 in obese (HCC) 06/28/1994   Dyspnea    uses oxygeb 2 liters per minute at dialysis   End stage renal disease on dialysis [facility])    on hemodialysis T/Th/Sat   Erosive esophagitis    Esophageal reflux    Eye redness    Gastroparesis    ? diabetic   GERD (gastroesophageal reflux disease)    History of blood transfusion 2019   Human immunodeficiency virus (HIV) disease (HCC) 04/23/2016   Hyperlipidemia    Hypertension    Metabolic bone  disease 05/02/2019   Moderate nonproliferative diabetic retinopathy of both eyes (HCC) 11/21/2014   11/14/14: Noted on retinal imaging; needs follow-up imaging in 6 months  05/22/16: Noted on retinal imaging again; needs follow-up imaging in 6 months   PAD (peripheral artery disease) (HCC)    Type 2 diabetes mellitus with diabetic peripheral angiopathy without gangrene (HCC) 05/01/2019   Wears dentures    lower   Wound infection s/p L transmetatarsal amputation        Current Medications, Allergies, Past Medical History, Past Surgical History, Family History and Social History were reviewed in [employer] record.   Review of Systems:   Constitutional: Negative for fever, sweats, chills or weight loss.  Respiratory: Negative for shortness of breath.  Cardiovascular: Negative for chest pain, palpitations and leg swelling.  Gastrointestinal: See HPI.  Musculoskeletal: Negative for back pain or muscle aches.  Neurological: Negative for dizziness, headaches or paresthesias.    Physical Exam: There were no vitals taken for this visit.  General: in no acute distress. Head: Normocephalic and atraumatic. Eyes: No scleral icterus. Conjunctiva pink . Ears: Normal auditory acuity. Mouth: Dentition intact. No ulcers or lesions.  Lungs: Clear throughout to auscultation. Heart: Regular rate and rhythm, no murmur. Abdomen: Soft, nontender and nondistended. No masses or hepatomegaly. Normal bowel sounds x 4 quadrants.  Rectal: *** Musculoskeletal: Symmetrical with no gross deformities. Extremities: No edema. Neurological: Alert oriented x 4. No focal deficits.  Psychological: Alert and cooperative. Normal mood and affect  Assessment and Recommendations: ***

## 2022-12-23 ENCOUNTER — Encounter (HOSPITAL_COMMUNITY): Payer: Self-pay

## 2022-12-23 ENCOUNTER — Other Ambulatory Visit: Payer: Self-pay

## 2022-12-23 ENCOUNTER — Emergency Department (HOSPITAL_COMMUNITY): Payer: 59

## 2022-12-23 ENCOUNTER — Observation Stay (HOSPITAL_COMMUNITY)
Admission: EM | Admit: 2022-12-23 | Discharge: 2022-12-25 | Disposition: A | Discharge status: Home / Self Care | Payer: 59 | Attending: Internal Medicine | Admitting: Internal Medicine

## 2022-12-23 DIAGNOSIS — K5900 Constipation, unspecified: Secondary | ICD-10-CM | POA: Diagnosis not present

## 2022-12-23 DIAGNOSIS — E1122 Type 2 diabetes mellitus with diabetic chronic kidney disease: Secondary | ICD-10-CM | POA: Diagnosis not present

## 2022-12-23 DIAGNOSIS — Z992 Dependence on renal dialysis: Secondary | ICD-10-CM | POA: Insufficient documentation

## 2022-12-23 DIAGNOSIS — Z87891 Personal history of nicotine dependence: Secondary | ICD-10-CM | POA: Diagnosis not present

## 2022-12-23 DIAGNOSIS — I2510 Atherosclerotic heart disease of native coronary artery without angina pectoris: Secondary | ICD-10-CM | POA: Diagnosis not present

## 2022-12-23 DIAGNOSIS — N186 End stage renal disease: Secondary | ICD-10-CM

## 2022-12-23 DIAGNOSIS — K921 Melena: Secondary | ICD-10-CM | POA: Insufficient documentation

## 2022-12-23 DIAGNOSIS — I120 Hypertensive chronic kidney disease with stage 5 chronic kidney disease or end stage renal disease: Secondary | ICD-10-CM | POA: Diagnosis not present

## 2022-12-23 DIAGNOSIS — D649 Anemia, unspecified: Secondary | ICD-10-CM | POA: Diagnosis not present

## 2022-12-23 DIAGNOSIS — Z21 Asymptomatic human immunodeficiency virus [HIV] infection status: Secondary | ICD-10-CM | POA: Insufficient documentation

## 2022-12-23 DIAGNOSIS — Z89612 Acquired absence of left leg above knee: Secondary | ICD-10-CM | POA: Insufficient documentation

## 2022-12-23 DIAGNOSIS — Z79899 Other long term (current) drug therapy: Secondary | ICD-10-CM | POA: Diagnosis not present

## 2022-12-23 DIAGNOSIS — R1084 Generalized abdominal pain: Secondary | ICD-10-CM | POA: Diagnosis not present

## 2022-12-23 DIAGNOSIS — Z8673 Personal history of transient ischemic attack (TIA), and cerebral infarction without residual deficits: Secondary | ICD-10-CM | POA: Insufficient documentation

## 2022-12-23 DIAGNOSIS — Z794 Long term (current) use of insulin: Secondary | ICD-10-CM | POA: Diagnosis not present

## 2022-12-23 DIAGNOSIS — B20 Human immunodeficiency virus [HIV] disease: Secondary | ICD-10-CM | POA: Diagnosis present

## 2022-12-23 DIAGNOSIS — D62 Acute posthemorrhagic anemia: Principal | ICD-10-CM | POA: Diagnosis present

## 2022-12-23 DIAGNOSIS — Z89611 Acquired absence of right leg above knee: Secondary | ICD-10-CM | POA: Diagnosis not present

## 2022-12-23 DIAGNOSIS — I770 Arteriovenous fistula, acquired: Secondary | ICD-10-CM | POA: Diagnosis present

## 2022-12-23 DIAGNOSIS — R079 Chest pain, unspecified: Secondary | ICD-10-CM | POA: Diagnosis not present

## 2022-12-23 DIAGNOSIS — R0789 Other chest pain: Secondary | ICD-10-CM | POA: Diagnosis present

## 2022-12-23 DIAGNOSIS — R109 Unspecified abdominal pain: Secondary | ICD-10-CM | POA: Diagnosis present

## 2022-12-23 LAB — HEPATIC FUNCTION PANEL
ALT: 7 U/L (ref 0–44)
AST: 12 U/L — ABNORMAL LOW (ref 15–41)
Albumin: 2.8 g/dL — ABNORMAL LOW (ref 3.5–5.0)
Alkaline Phosphatase: 49 U/L (ref 38–126)
Bilirubin, Direct: <0.1 mg/dL (ref 0.0–0.2)
Total Bilirubin: 0.2 mg/dL — ABNORMAL LOW (ref 0.3–1.2)
Total Protein: 8.3 g/dL — ABNORMAL HIGH (ref 6.5–8.1)

## 2022-12-23 LAB — CBC
HCT: 21 % — ABNORMAL LOW (ref 36.0–46.0)
Hemoglobin: 6.4 g/dL — CL (ref 12.0–15.0)
MCH: 28.8 pg (ref 26.0–34.0)
MCHC: 30.5 g/dL (ref 30.0–36.0)
MCV: 94.6 fL (ref 80.0–100.0)
Platelets: 302 10*3/uL (ref 150–400)
RBC: 2.22 MIL/uL — ABNORMAL LOW (ref 3.87–5.11)
RDW: 15.9 % — ABNORMAL HIGH (ref 11.5–15.5)
WBC: 4.5 10*3/uL (ref 4.0–10.5)
nRBC: 0 % (ref 0.0–0.2)

## 2022-12-23 LAB — BASIC METABOLIC PANEL
Anion gap: 19 — ABNORMAL HIGH (ref 5–15)
BUN: 26 mg/dL — ABNORMAL HIGH (ref 6–20)
CO2: 25 mmol/L (ref 22–32)
Calcium: 9.1 mg/dL (ref 8.9–10.3)
Chloride: 96 mmol/L — ABNORMAL LOW (ref 98–111)
Creatinine, Ser: 6.25 mg/dL — ABNORMAL HIGH (ref 0.44–1.00)
GFR, Estimated: 8 mL/min — ABNORMAL LOW (ref >60)
Glucose, Bld: 175 mg/dL — ABNORMAL HIGH (ref 70–99)
Potassium: 4.1 mmol/L (ref 3.5–5.1)
Sodium: 140 mmol/L (ref 135–145)

## 2022-12-23 LAB — TROPONIN I (HIGH SENSITIVITY)
Troponin I (High Sensitivity): 50 ng/L — ABNORMAL HIGH (ref <18)
Troponin I (High Sensitivity): 41 ng/L — ABNORMAL HIGH (ref <18)

## 2022-12-23 LAB — PROTIME-INR
INR: 1.1 (ref 0.8–1.2)
Prothrombin Time: 14.6 seconds (ref 11.4–15.2)

## 2022-12-23 LAB — LIPASE, BLOOD: Lipase: 36 U/L (ref 11–51)

## 2022-12-23 LAB — GLUCOSE, CAPILLARY: Glucose-Capillary: 164 mg/dL — ABNORMAL HIGH (ref 70–99)

## 2022-12-23 MED ORDER — ACETAMINOPHEN 500 MG PO TABS
1000.0000 mg | ORAL_TABLET | Freq: Four times a day (QID) | ORAL | Status: DC
  Administered 2022-12-24 – 2022-12-25 (×4): 1000 mg via ORAL
  Filled 2022-12-23 (×5): qty 2

## 2022-12-23 MED ORDER — PANTOPRAZOLE SODIUM 40 MG PO TBEC
40.0000 mg | DELAYED_RELEASE_TABLET | Freq: Every day | ORAL | Status: DC
  Administered 2022-12-24: 40 mg via ORAL
  Filled 2022-12-23: qty 1

## 2022-12-23 MED ORDER — ALBUTEROL SULFATE HFA 108 (90 BASE) MCG/ACT IN AERS: 1.0000 | INHALATION_SPRAY | RESPIRATORY_TRACT | Status: DC | PRN

## 2022-12-23 MED ORDER — ATORVASTATIN CALCIUM 80 MG PO TABS
80.0000 mg | ORAL_TABLET | Freq: Every day | ORAL | Status: DC
  Administered 2022-12-24 – 2022-12-25 (×2): 80 mg via ORAL
  Filled 2022-12-23: qty 2
  Filled 2022-12-23: qty 1

## 2022-12-23 MED ORDER — INSULIN GLARGINE-YFGN 100 UNIT/ML ~~LOC~~ SOLN
15.0000 [IU] | Freq: Every day | SUBCUTANEOUS | Status: DC
  Administered 2022-12-23 – 2022-12-24 (×2): 15 [IU] via SUBCUTANEOUS
  Filled 2022-12-23 (×3): qty 0.15

## 2022-12-23 MED ORDER — ALBUTEROL SULFATE (2.5 MG/3ML) 0.083% IN NEBU: 2.5000 mg | INHALATION_SOLUTION | RESPIRATORY_TRACT | Status: DC | PRN

## 2022-12-23 MED ORDER — BICTEGRAVIR-EMTRICITAB-TENOFOV 50-200-25 MG PO TABS
1.0000 | ORAL_TABLET | Freq: Every day | ORAL | Status: DC
  Administered 2022-12-24 – 2022-12-25 (×2): 1 via ORAL
  Filled 2022-12-23 (×2): qty 1

## 2022-12-23 MED ORDER — POLYETHYLENE GLYCOL 3350 17 G PO PACK
17.0000 g | PACK | Freq: Every day | ORAL | Status: DC
  Administered 2022-12-23 – 2022-12-24 (×2): 17 g via ORAL
  Filled 2022-12-23 (×2): qty 1

## 2022-12-23 MED ORDER — HEPARIN SODIUM (PORCINE) 5000 UNIT/ML IJ SOLN
5000.0000 [IU] | Freq: Three times a day (TID) | INTRAMUSCULAR | Status: DC
  Administered 2022-12-23: 5000 [IU] via SUBCUTANEOUS
  Filled 2022-12-23: qty 1

## 2022-12-23 MED ORDER — ESCITALOPRAM OXALATE 10 MG PO TABS
5.0000 mg | ORAL_TABLET | Freq: Every day | ORAL | Status: DC
  Administered 2022-12-24 – 2022-12-25 (×2): 5 mg via ORAL
  Filled 2022-12-23 (×2): qty 1

## 2022-12-23 MED ORDER — OXYCODONE HCL 5 MG PO TABS
5.0000 mg | ORAL_TABLET | Freq: Four times a day (QID) | ORAL | Status: DC | PRN
  Administered 2022-12-23 – 2022-12-24 (×2): 5 mg via ORAL
  Filled 2022-12-23 (×2): qty 1

## 2022-12-23 MED ORDER — INSULIN ASPART 100 UNIT/ML IJ SOLN: 0.0000 [IU] | Freq: Three times a day (TID) | INTRAMUSCULAR | Status: DC

## 2022-12-23 MED ORDER — AMLODIPINE BESYLATE 10 MG PO TABS
10.0000 mg | ORAL_TABLET | Freq: Every day | ORAL | Status: DC
  Administered 2022-12-24 – 2022-12-25 (×2): 10 mg via ORAL
  Filled 2022-12-23: qty 2
  Filled 2022-12-23: qty 1

## 2022-12-23 MED ORDER — GABAPENTIN 100 MG PO CAPS
100.0000 mg | ORAL_CAPSULE | Freq: Every day | ORAL | Status: DC
  Administered 2022-12-23 – 2022-12-24 (×2): 100 mg via ORAL
  Filled 2022-12-23 (×2): qty 1

## 2022-12-23 NOTE — H&P (Signed)
Date: 12/23/2022         Patient Name:  Kathy Frank MRN: [id number]  DOB: 1975/08/11 Age / Sex: 47 y.o., female   PCP: Quincy Simmonds, MD         Medical Service: Internal Medicine Teaching Service         Attending Physician: Dr. Gust Rung, DO    First Contact: Dr. Benito Mccreedy Pager: [phone]  Second Contact: Dr. Marijo Conception Pager: [phone]       After Hours (After 5p/  First Contact Pager: [phone]  weekends / holidays): Second Contact Pager: [phone]   Chief Concern: Anemia  History of Present Illness: 47 year old with ESRD presents to emergency department for anemia.  She went to dialysis yesterday, and was called today about a low hemoglobin of 5.  Review of systems positive for shortness of breath and intermittent chest pain.  She reports epistaxis last week, 2 prolonged episodes.  She has a history of bad nosebleeds.  She also notes some intermittent blood in her stool when she cleans herself.  Minimal amount, not filling the bowl with blood.  No black stool.  She has some crampy lower abdominal pain not associated with or relieved by defecation.  She has never had a colonoscopy.  No recent NSAID use.  Recent history is also notable for an AV fistula revision while she was traveling to visit family in [place].  She had an issue while receiving dialysis there where they could not stop bleeding after decannulation.  Since then she has had some irritation and drainage at the surgical site.  She has HD cath, appears to be tunneled right subclavian or IJ.  In ED, she was hemodynamically stable.  Hemoglobin was around 6.5.  PT/INR within normal limits.  Troponin 41, 52.  Review of Systems  Constitutional:  Positive for diaphoresis. Negative for fever.  Respiratory:  Positive for shortness of breath.   Cardiovascular:  Positive for chest pain.  Gastrointestinal:  Positive for abdominal pain and blood in stool. Negative for diarrhea, melena, nausea and vomiting.   Allergies: No  Known Allergies   Past Medical History: ESRD on dialysis TTS History of gastric ulcer, status post EGD and clipping in March 2024 Diabetes CAD status post stent in LAD HIV, undetectable, last CD4 count 397 PAD Status post bilateral lower extremity amputations Migraines  Medications: Amlodipine Atorvastatin Biktarvy Calcitriol Calcium acetate Escitalopram Gabapentin Insulin glargine 15 units daily Aranesp Pantoprazole 40 mg daily Topiramate 25 mg Ubrelvy  Surgical History: Reviewed, notable for left upper extremity AV fistula revision 12/02/2022, EGD 10/14/2022  Family History:  Reviewed, no family history of bleeding disorders  Social History:  Lives with daughters and grandchildren.  Home is a safe place.  Recently traveled to [place] to visit family and had a fun relaxing trip.  Does not drink alcohol.  Smokes marijuana occasionally.  Does not use other drugs.  Physical Exam: Blood pressure (!) 166/69, pulse 79, temperature 98.1 F (36.7 C), temperature source Oral, resp. rate 17, SpO2 100 %.  No apparent distress Heart rate is normal, rhythm is regular, right radial pulses strong, no appreciable murmurs Breathing is regular and unlabored on room air, lungs are clear to auscultation Skin is warm and dry, left AV fistula site with healing surgical scars and sutures in place, one area with some minor dehiscence and evidence of drainage, some tenderness and erythema around here Chest with tunneled HD cath on the right, no tenderness or erythema around here Abdomen  with some epigastric tenderness, otherwise soft and nondistended Alert and oriented Pleasant, concordant affect  EKG:  Sinus rhythm  Labs: Sodium 140 Potassium 4.1 CO2 25 BUN 26  WBC 4.5 Hemoglobin 6.4  Troponin 50, 41  PT/INR 14.6/1.1  Images and other studies: Chest x-Frank with cardiomegaly  Assessment & Plan:  Kathy Frank is a 47 y.o. with ESRD, history of gastric ulcer, presents  after she was found to be anemic during dialysis session, with mild symptoms and some rectal bleeding and epistaxis.  Principal Problem:   Acute blood loss anemia Active Problems:   Human immunodeficiency virus (HIV) disease (HCC)   ESRD on dialysis (HCC)   A-V fistula (HCC)  Acute on chronic anemia Long history of anemia thought to be multifactorial due to ESRD and gastric ulcers.  She has had these clipped and was instructed to stop using NSAIDs which was thought to be a contributing factor.  Potential source of blood loss include the epistaxis within the last week, or GI bleeding.  Reassuring that she has not had any melena or large quantity rectal bleeding.  Her symptoms are relatively mild, thus I think the tempo of this anemia is subacute.  Will transfuse a unit of PRBCs during dialysis tomorrow.  She will get Aranesp during that session.  She is stable now, no need for urgent GI consult. - Transfuse PRBCs during dialysis - Serial hemoglobin - Continue pantoprazole 40 mg p.o. daily  AV fistula, status post recent revision She has had some irritation and drainage from the site of this revised fistula.  Pictures placed in the chart.  No bleeding from this currently.  She has some mild erythema and tenderness, this may be routine postsurgical change.  She is not systemically ill or septic.  White blood cells within normal limits and no fever.  Will defer antibiotics for now, with low threshold for blood cultures or starting antibiotics in this person with indwelling central line and recent vascular surgery.  She has follow-up with vascular surgery at the end of the month in [place]. - Non-urgent vascular surgery consult  ESRD on dialysis Nephrology consulted.  Will be on the schedule tomorrow plan for transfusion and Aranesp then.  Abdominal pain Crampy and predominantly lower abdominal pain.  Had some colonic edema versus proctitis on a recent CT abdomen.  Some very mild epigastric  tenderness on exam, but no peritoneal signs.  Exam is otherwise benign.  Low index of suspicion for pancreatitis. - Follow-up lipase  HIV Without acquired immune deficiency.  Virus is undetectable and CD4 count is around 400. - Continue Biktarvy  Hypertension Systolic BP around 160.  Continue home meds. - Amlodipine 10 mg  Coronary artery disease - Continue atorvastatin 80 mg daily  Type 2 diabetes - Continue glargine 15 units daily  Diet: Carb modified IVF: None VTE: Held in setting of subacute bleeding Code: Full Surrogate: Son, [place]  Admit patient to Inpatient with expected length of stay greater than 2 midnights.  Signed: Marrianne Mood MD 12/23/2022, 6:49 PM  Pager: [phone] After 5pm on weekdays and 1pm on weekends: [phone]

## 2022-12-23 NOTE — ED Notes (Signed)
ED TO INPATIENT HANDOFF REPORT  ED Nurse Name and Phone #:  Marisue Ivan [phone]  S Name/Age/Gender Kathy Frank 47 y.o. female Room/Bed: 020C/020C  Code Status   Code Status: Full Code  Home/SNF/Other Home Patient oriented to: self, place, time, and situation Is this baseline? Yes   Triage Complete: Triage complete  Chief Complaint Acute blood loss anemia [D62]  Triage Note Pt arrives via POV from home. Pt states she had blood work drawn yesterday and was told today that her HGB is 5. Pt reports sob, abdominal cramping and some blood in her stool since yesterday. C/o of chest pain that started today.  Pt had a full session of dialysis yesterday.   Pt also had a revision to fistula in her left upper arm. Pt reports ongoing pain, swelling and warmth to the area.    Allergies No Known Allergies  Level of Care/Admitting Diagnosis ED Disposition     ED Disposition  Admit   Condition  --   Comment  Hospital Area: [facility] [[id number]]  Level of Care: Med-Surg [16]  May admit patient to [facility] or [facility] if equivalent level of care is available:: No  Covid Evaluation: Asymptomatic - no recent exposure (last 10 days) testing not required  Diagnosis: Acute blood loss anemia [161096]  Admitting Physician: Silvio Pate  Attending Physician: Gust Rung [[id number]]  Certification:: I certify this patient will need inpatient services for at least 2 midnights  Estimated Length of Stay: 2          B Medical/Surgery History Past Medical History:  Diagnosis Date   Anal abscess    chronic   Anxiety    CAD (coronary artery disease)    CVA (cerebral vascular accident) (HCC)    Depression 06/28/2006   Qualifier: Diagnosis of  By: Laveda Abbe MD, Todd     Diabetes mellitus type 2 in obese 06/28/1994   Dyspnea    uses oxygeb 2 liters per minute at dialysis   End stage renal disease on dialysis [facility])    on hemodialysis T/Th/Sat   Erosive  esophagitis    Esophageal reflux    Eye redness    Gastroparesis    ? diabetic   GERD (gastroesophageal reflux disease)    History of blood transfusion 2019   Human immunodeficiency virus (HIV) disease (HCC) 04/23/2016   Hyperlipidemia    Hypertension    Metabolic bone disease 05/02/2019   Moderate nonproliferative diabetic retinopathy of both eyes (HCC) 11/21/2014   11/14/14: Noted on retinal imaging; needs follow-up imaging in 6 months  05/22/16: Noted on retinal imaging again; needs follow-up imaging in 6 months   PAD (peripheral artery disease) (HCC)    Type 2 diabetes mellitus with diabetic peripheral angiopathy without gangrene (HCC) 05/01/2019   Wears dentures    lower   Wound infection s/p L transmetatarsal amputation    Past Surgical History:  Procedure Laterality Date   ABDOMINAL AORTOGRAM W/LOWER EXTREMITY N/A 12/13/2020   Procedure: ABDOMINAL AORTOGRAM W/LOWER EXTREMITY;  Surgeon: Chuck Hint, MD;  Location: [facility] INVASIVE CV LAB;  Service: Cardiovascular;  Laterality: N/A;   AMPUTATION Left 07/08/2018   Procedure: AMPUTATION FORTH RAY LEFT FOOT;  Surgeon: Nadara Mustard, MD;  Location: [facility] OR;  Service: Orthopedics;  Laterality: Left;   AMPUTATION Left 08/09/2018   Procedure: Left Transmetatarsal Amputation;  Surgeon: Nadara Mustard, MD;  Location: [facility] OR;  Service: Orthopedics;  Laterality: Left;   AMPUTATION Left 10/08/2018  Procedure: LEFT BELOW KNEE AMPUTATION;  Surgeon: Nadara Mustard, MD;  Location: [facility] OR;  Service: Orthopedics;  Laterality: Left;   AMPUTATION Left 10/28/2018   Procedure: REVISION BELOW KNEE AMPUTATION;  Surgeon: Nadara Mustard, MD;  Location: [facility] OR;  Service: Orthopedics;  Laterality: Left;   AMPUTATION Left 12/16/2018   Procedure: LEFT ABOVE KNEE AMPUTATION;  Surgeon: Nadara Mustard, MD;  Location: [facility] OR;  Service: Orthopedics;  Laterality: Left;   AMPUTATION Right 01/31/2021   Procedure: AMPUTATION BELOW KNEE;  Surgeon: Nada Libman, MD;   Location: [facility] OR;  Service: Vascular;  Laterality: Right;   AMPUTATION Right 03/28/2021   Procedure: REVISION OF RIGHT BELOW KNEE AMPUTATION;  Surgeon: Nada Libman, MD;  Location: [facility] OR;  Service: Vascular;  Laterality: Right;   AMPUTATION Right 04/18/2021   Procedure: right below knee amputation/washout placement wound vac;  Surgeon: Leonie Douglas, MD;  Location: [facility] OR;  Service: Vascular;  Laterality: Right;   AV FISTULA PLACEMENT Left 10/14/2018   Procedure: Arteriovenous (Av) Fistula Creation Left Arm;  Surgeon: Cephus Shelling, MD;  Location: [facility] OR;  Service: Vascular;  Laterality: Left;   BASCILIC VEIN TRANSPOSITION Left 04/14/2019   Procedure: BASILIC VEIN TRANSPOSITION SECOND STAGE LEFT ARM;  Surgeon: Larina Earthly, MD;  Location: [facility] OR;  Service: Vascular;  Laterality: Left;   BIOPSY  10/14/2022   Procedure: BIOPSY;  Surgeon: Benancio Deeds, MD;  Location: [facility] ENDOSCOPY;  Service: Gastroenterology;;   CORONARY STENT INTERVENTION N/A 05/21/2021   Procedure: CORONARY STENT INTERVENTION;  Surgeon: Marykay Lex, MD;  Location: [facility] INVASIVE CV LAB;  Service: Cardiovascular;  Laterality: N/A;   ESOPHAGOGASTRODUODENOSCOPY (EGD) WITH PROPOFOL N/A 10/14/2022   Procedure: ESOPHAGOGASTRODUODENOSCOPY (EGD) WITH PROPOFOL;  Surgeon: Benancio Deeds, MD;  Location: [facility] ENDOSCOPY;  Service: Gastroenterology;  Laterality: N/A;   HEMOSTASIS CLIP PLACEMENT  10/14/2022   Procedure: HEMOSTASIS CLIP PLACEMENT;  Surgeon: Benancio Deeds, MD;  Location: [facility] ENDOSCOPY;  Service: Gastroenterology;;   INCISION AND DRAINAGE ABSCESS N/A 10/24/2020   Procedure: exicision of hydradenitis;  Surgeon: Romie Levee, MD;  Location: [facility];  Service: General;  Laterality: N/A;  45 min   INSERTION OF DIALYSIS CATHETER Right 04/14/2019   Procedure: INSERTION OF DIALYSIS CATHETER;  Surgeon: Larina Earthly, MD;  Location: [facility] OR;  Service: Vascular;  Laterality: Right;   IR FLUORO GUIDE CV LINE  RIGHT  10/13/2022   IR REMOVAL TUN CV CATH W/O FL  10/16/2022   IR US GUIDE VASC ACCESS RIGHT  10/13/2022   LEFT HEART CATH AND CORONARY ANGIOGRAPHY N/A 05/21/2021   Procedure: LEFT HEART CATH AND CORONARY ANGIOGRAPHY;  Surgeon: Marykay Lex, MD;  Location: [facility] INVASIVE CV LAB;  Service: Cardiovascular;  Laterality: N/A;   LOWER EXTREMITY ANGIOGRAPHY N/A 07/05/2018   Procedure: LOWER EXTREMITY ANGIOGRAPHY;  Surgeon: Nada Libman, MD;  Location: [facility] INVASIVE CV LAB;  Service: Cardiovascular;  Laterality: N/A;   PERIPHERAL VASCULAR ATHERECTOMY Right 12/13/2020   Procedure: PERIPHERAL VASCULAR ATHERECTOMY;  Surgeon: Chuck Hint, MD;  Location: [facility] INVASIVE CV LAB;  Service: Cardiovascular;  Laterality: Right;  Superficial femoral   PERIPHERAL VASCULAR BALLOON ANGIOPLASTY Left 07/05/2018   Procedure: PERIPHERAL VASCULAR BALLOON ANGIOPLASTY;  Surgeon: Nada Libman, MD;  Location: [facility] INVASIVE CV LAB;  Service: Cardiovascular;  Laterality: Left;  SFA   PERIPHERAL VASCULAR BALLOON ANGIOPLASTY Right 12/13/2020   Procedure: PERIPHERAL VASCULAR BALLOON ANGIOPLASTY;  Surgeon: Chuck Hint, MD;  Location: [facility] INVASIVE  CV LAB;  Service: Cardiovascular;  Laterality: Right;  Peroneal artery, anterior tibial artery   RECTAL EXAM UNDER ANESTHESIA N/A 10/24/2020   Procedure: EXAM UNDER ANESTHESIA;  Surgeon: Romie Levee, MD;  Location: [facility];  Service: General;  Laterality: N/A;   STUMP REVISION Left 10/19/2018   Procedure: REVISION LEFT BELOW KNEE AMPUTATION;  Surgeon: Nadara Mustard, MD;  Location: [facility] OR;  Service: Orthopedics;  Laterality: Left;   TUBAL LIGATION  2002     A IV Location/Drains/Wounds Patient Lines/Drains/Airways Status     Active Line/Drains/Airways     Name Placement date Placement time Site Days   Peripheral IV 11/07/22 20 G 1.88" Anterior;Right;Upper Arm 11/07/22  0229  Arm  46   Peripheral IV 12/23/22 20 G 1.88" Right;Anterior Forearm 12/23/22   1618  Forearm  less than 1   Fistula / Graft Left Forearm Arteriovenous fistula 04/14/19  1642  Forearm  1349   Fistula / Graft Left Upper arm --  --  Upper arm  --   Fistula / Graft Left Upper arm --  --  Upper arm  --            Intake/Output Last 24 hours No intake or output data in the 24 hours ending 12/23/22 1755  Labs/Imaging Results for orders placed or performed during the hospital encounter of 12/23/22 (from the past 48 hour(s))  Basic metabolic panel     Status: Abnormal   Collection Time: 12/23/22  2:30 PM  Result Value Ref Range   Sodium 140 135 - 145 mmol/L   Potassium 4.1 3.5 - 5.1 mmol/L   Chloride 96 (L) 98 - 111 mmol/L   CO2 25 22 - 32 mmol/L   Glucose, Bld 175 (H) 70 - 99 mg/dL    Comment: Glucose reference range applies only to samples taken after fasting for at least 8 hours.   BUN 26 (H) 6 - 20 mg/dL   Creatinine, Ser 2.13 (H) 0.44 - 1.00 mg/dL   Calcium 9.1 8.9 - 08.6 mg/dL   GFR, Estimated 8 (L) >60 mL/min    Comment: (NOTE) Calculated using the CKD-EPI Creatinine Equation (2021)    Anion gap 19 (H) 5 - 15    Comment: Performed at [facility] Lab, [address], [place], [place] [address]  CBC     Status: Abnormal   Collection Time: 12/23/22  2:30 PM  Result Value Ref Range   WBC 4.5 4.0 - 10.5 K/uL   RBC 2.22 (L) 3.87 - 5.11 MIL/uL   Hemoglobin 6.4 (LL) 12.0 - 15.0 g/dL    Comment: REPEATED TO VERIFY THIS CRITICAL RESULT HAS VERIFIED AND BEEN CALLED TO LIZ Remi Lopata, RN BY SHAY EKDAHL ON 05 15 2024 AT 1531, AND HAS BEEN READ BACK.     HCT 21.0 (L) 36.0 - 46.0 %   MCV 94.6 80.0 - 100.0 fL   MCH 28.8 26.0 - 34.0 pg   MCHC 30.5 30.0 - 36.0 g/dL   RDW 96.2 (H) 95.2 - 84.1 %   Platelets 302 150 - 400 K/uL   nRBC 0.0 0.0 - 0.2 %    Comment: Performed at [facility], [address], [place], [place] [address]  Troponin I (High Sensitivity)     Status: Abnormal   Collection Time: 12/23/22  2:30 PM  Result Value Ref Range   Troponin I  (High Sensitivity) 50 (H) <18 ng/L    Comment: (NOTE) Elevated high sensitivity troponin I (  hsTnI) values and significant  changes across serial measurements may suggest ACS but many other  chronic and acute conditions are known to elevate hsTnI results.  Refer to the "Links" section for chest pain algorithms and additional  guidance. Performed at [facility], [address], [place], [place] [address]   Type and screen [facility]     Status: None   Collection Time: 12/23/22  2:30 PM  Result Value Ref Range   ABO/RH(D) B POS    Antibody Screen NEG    Sample Expiration      12/26/2022,2359 Performed at [facility], [address] [place], [place] [address]   Troponin I (High Sensitivity)     Status: Abnormal   Collection Time: 12/23/22  4:23 PM  Result Value Ref Range   Troponin I (High Sensitivity) 41 (H) <18 ng/L    Comment: (NOTE) Elevated high sensitivity troponin I (hsTnI) values and significant  changes across serial measurements may suggest ACS but many other  chronic and acute conditions are known to elevate hsTnI results.  Refer to the "Links" section for chest pain algorithms and additional  guidance. Performed at [facility], [address], [place], [place] [address]   Protime-INR     Status: None   Collection Time: 12/23/22  4:23 PM  Result Value Ref Range   Prothrombin Time 14.6 11.4 - 15.2 seconds   INR 1.1 0.8 - 1.2    Comment: (NOTE) INR goal varies based on device and disease states. Performed at [facility] Lab, [address], [place], [place] [address]   Hepatic function panel     Status: Abnormal   Collection Time: 12/23/22  4:23 PM  Result Value Ref Range   Total Protein 8.3 (H) 6.5 - 8.1 g/dL   Albumin 2.8 (L) 3.5 - 5.0 g/dL   AST 12 (L) 15 - 41 U/L   ALT 7 0 - 44 U/L   Alkaline Phosphatase 49 38 - 126 U/L   Total Bilirubin 0.2 (L) 0.3 - 1.2 mg/dL   Bilirubin, Direct <2.9 0.0 - 0.2 mg/dL   Indirect  Bilirubin NOT CALCULATED 0.3 - 0.9 mg/dL    Comment: Performed at [facility] Lab, [address] [place], [place] [address]   DG Chest 1 View  Result Date: 12/23/2022 CLINICAL DATA:  Chest pain EXAM: CHEST  1 VIEW COMPARISON:  Chest x-ray 10/12/2022 FINDINGS: Right chest port catheter tip projects over the distal SVC. The heart is enlarged. The lungs and costophrenic angles are clear. There is no pneumothorax or acute fracture. IMPRESSION: Cardiomegaly with no acute pulmonary process. Electronically Signed   By: Darliss Cheney M.D.   On: 12/23/2022 15:12    Pending Labs Unresulted Labs (From admission, onward)     Start     Ordered   12/23/22 1353  Occult blood card to lab, stool  Once,   URGENT        12/23/22 1352            Vitals/Pain Today's Vitals   12/23/22 1339 12/23/22 1408 12/23/22 1754  BP: (!) 166/69    Pulse: 79    Resp: 17    Temp: 98.1 F (36.7 C)  98.1 F (36.7 C)  TempSrc:   Oral  SpO2: 100%    PainSc:  6      Isolation Precautions No active isolations  Medications Medications  heparin injection 5,000 Units (has no administration in  time range)    Mobility power wheelchair     Focused Assessments Renal Assessment Handoff:  Hemodialysis Schedule: Hemodialysis Schedule: Tuesday/Thursday/Saturday Last Hemodialysis date and time:  Tuesday   Restricted appendage: left arm   R Recommendations: See Admitting Provider Note  Report given to:   Additional Notes:

## 2022-12-23 NOTE — ED Provider Triage Note (Signed)
Emergency Medicine Provider Triage Evaluation Note  Kathy Frank , a 47 y.o. female  was evaluated in triage.  Pt complains of chest pain, shortness of breath, which has been ongoing over the last 24 hours.  Had dialysis today and received a full treatment, reports prior transfusions in the past. Prior CBC at 8.1 but today is 5. Also, concern with leaking wound on her left fistula.   Review of Systems  Positive: Sob, cp, blood in stool Negative: vomiting  Physical Exam  BP (!) 166/69 (BP Location: Right Arm)   Pulse 79   Temp 98.1 F (36.7 C)   Resp 17   SpO2 100%  Gen:   Awake, no distress   Resp:  Normal effort  MSK:   Moves extremities without difficulty  Other:    Medical Decision Making  Medically screening exam initiated at 2:11 PM.  Appropriate orders placed.  Kathy Frank was informed that the remainder of the evaluation will be completed by another provider, this initial triage assessment does not replace that evaluation, and the importance of remaining in the ED until their evaluation is complete.     Kathy Manges, PA-C 12/23/22 1416

## 2022-12-23 NOTE — Progress Notes (Signed)
Notified by RN that pt was having continued pain at left arm AV fistula and in lower abdomen. Patient states her arm pain is 8/10 and that she can barely move it. Unable to visualize any changes at her fistula site. No drainage or change in color. Palpable thrill noted. Distal LUE warm with normal pulses. She keeps her arm elevated above head and states this helps with discomfort. No tenderness with palpation of lower abdomen. Afebrile without tachycardia. Overall patient actually appears pretty comfortable. Her oxycodone at 2030 was apparently of not benefit and I do not feel additional opioids would help. She hasn't had tylenol so will recommend this as I feel it could help with her stomach cramps. Otherwise, will continue q6 hr prn oxy order.

## 2022-12-23 NOTE — ED Notes (Signed)
Critical lab hgb 6.4 given face to face with Dr. Rubin Payor

## 2022-12-23 NOTE — ED Triage Notes (Addendum)
Pt arrives via POV from home. Pt states she had blood work drawn yesterday and was told today that her HGB is 5. Pt reports sob, abdominal cramping and some blood in her stool since yesterday. C/o of chest pain that started today.  Pt had a full session of dialysis yesterday.   Pt also had a revision to fistula in her left upper arm. Pt reports ongoing pain, swelling and warmth to the area.

## 2022-12-23 NOTE — ED Provider Notes (Signed)
[place] EMERGENCY DEPARTMENT AT [facility] Provider Note   CSN: [id number] Arrival date & time: 12/23/22  1321     History  Chief Complaint  Patient presents with   Chest Pain   Shortness of Breath   Abnormal Lab    Kathy Frank is a 47 y.o. female.   Chest Pain Associated symptoms: shortness of breath   Shortness of Breath Associated symptoms: chest pain   Abnormal Lab Patient presents with chest pain shortness of breath and reported hemoglobin of 5.  Was dialyzed yesterday.  Has had several different reasons for anemia.  Has had a couple nosebleeds in the last week.  Had a bleeding fistula on the left arm that was treated with surgery in [place].  Also states she has had blood with wiping after going to the bathroom.  Has chest pain.  Due for dialysis tomorrow.     Home Medications Prior to Admission medications   Medication Sig Start Date End Date Taking? Authorizing Provider  Accu-Chek Softclix Lancets lancets Use as instructed 05/02/21   Atway, Rayann N, DO  albuterol (VENTOLIN HFA) 108 (90 Base) MCG/ACT inhaler Inhale 2 puffs into the lungs every 4 (four) hours as needed for wheezing or shortness of breath. 02/14/21   Angiulli, Mcarthur Rossetti, PA-C  amLODipine (NORVASC) 10 MG tablet Take 1 tablet (10 mg total) by mouth daily. 10/17/22   Rocky Morel, DO  atorvastatin (LIPITOR) 80 MG tablet Take 1 tablet (80 mg total) by mouth daily. 11/03/22   Adron Bene, MD  bictegravir-emtricitabine-tenofovir AF (BIKTARVY) 50-200-25 MG TABS tablet Take 1 tablet by mouth daily. OVERDUE FOR OFFICE FOLLOW UP - PLEASE CALL OUR OFFICE TO SCHEDULE [phone] Patient taking differently: Take 1 tablet by mouth daily. 08/26/22   Cliffton Asters, MD  calcitRIOL (ROCALTROL) 0.25 MCG capsule Take 5 capsules (1.25 mcg total) by mouth Every Tuesday,Thursday,and Saturday with dialysis. 07/02/22   Marolyn Haller, MD  calcium acetate (PHOSLO) 667 MG capsule Take 2 capsules (1,334 mg  total) by mouth 3 (three) times daily with meals. 06/30/22   Marolyn Haller, MD  Continuous Blood Gluc Receiver (DEXCOM G7 RECEIVER) DEVI Use to check blood sugars cntinuously 06/30/22   Marolyn Haller, MD  Continuous Blood Gluc Sensor (DEXCOM G7 SENSOR) MISC Use to check blood sugar continuously 06/30/22   Marolyn Haller, MD  Darbepoetin Alfa (ARANESP) 200 MCG/0.4ML SOSY injection Inject 0.4 mLs (200 mcg total) into the skin every Monday at 6 PM. 07/06/22   Marolyn Haller, MD  diclofenac Sodium (VOLTAREN) 1 % GEL Apply 4 g topically 4 (four) times daily. 10/02/22   Adron Bene, MD  EQ ASPIRIN ADULT LOW DOSE 81 MG tablet TAKE 1 TABLET BY MOUTH ONCE DAILY. SWALLOW WHOLE. Patient taking differently: Take 81 mg by mouth daily. 02/06/22   Maeola Harman, MD  escitalopram (LEXAPRO) 5 MG tablet Take 1 tablet (5 mg total) by mouth daily. 11/03/22   Adron Bene, MD  gabapentin (NEURONTIN) 100 MG capsule Take 100 mg by mouth at bedtime. 09/10/22   [provider]  glucose blood test strip Use as instructed 05/02/21   Atway, Rayann N, DO  hydrocortisone-pramoxine (PROCTOFOAM-HC) rectal foam Place 1 applicator rectally 2 (two) times daily. 11/07/22   Dione Booze, MD  insulin glargine (LANTUS SOLOSTAR) 100 UNIT/ML Solostar Pen Inject 15 Units into the skin at bedtime. 11/03/22   Adron Bene, MD  Insulin Pen Needle (PENTIPS) 32G X 4 MM MISC Use as directed 11/10/22   Quincy Simmonds,  MD  lidocaine (LIDODERM) 5 % Place 1 patch onto the skin daily. Remove & Discard patch within 12 hours or as directed by MD 10/03/22   Adron Bene, MD  liraglutide (VICTOZA) 18 MG/3ML SOPN Inject 0.6 mg into the skin daily. 11/03/22 10/29/23  Adron Bene, MD  LOKELMA 5 g packet Take 5 g by mouth daily. 07/30/22   [provider]  multivitamin (RENA-VIT) TABS tablet Take 1 tablet by mouth daily. Patient taking differently: Take 1 tablet by mouth See admin instructions. On Tuesday,  Wednesday and Thursday 02/14/21   Angiulli, Mcarthur Rossetti, PA-C  pantoprazole (PROTONIX) 40 MG tablet Take 1 tablet (40 mg total) by mouth 2 (two) times daily for 28 days, THEN 1 tablet (40 mg total) daily. 10/16/22 02/11/23  Rocky Morel, DO  polyethylene glycol (MIRALAX / GLYCOLAX) 17 g packet Take 17 g by mouth daily. Patient not taking: Reported on 10/16/2022 07/01/22   Marolyn Haller, MD  topiramate (TOPAMAX) 25 MG tablet Take 1 tablet (25 mg total) by mouth daily for 7 days, THEN 1 tablet (25 mg total) 2 (two) times daily for 7 days. 10/16/22 10/30/22  Rocky Morel, DO  Ubrogepant (UBRELVY) 50 MG TABS Take 1 tablet (50 mg total) by mouth as needed (migraine headache). if needed a second dose may be taken at least 2 hours after the initial dose; MAX 8 doses in a month 11/24/22   Quincy Simmonds, MD      Allergies    Patient has no known allergies.    Review of Systems   Review of Systems  Respiratory:  Positive for shortness of breath.   Cardiovascular:  Positive for chest pain.    Physical Exam Updated Vital Signs BP (!) 166/69 (BP Location: Right Arm)   Pulse 79   Temp 98.1 F (36.7 C)   Resp 17   SpO2 100%  Physical Exam Vitals reviewed.  HENT:     Head: Atraumatic.  Cardiovascular:     Rate and Rhythm: Regular rhythm.  Pulmonary:     Breath sounds: No wheezing or rhonchi.  Chest:     Comments: Right chest wall catheter. Musculoskeletal:     Comments: Left upper extremity fistula postsurgery.  Skin:    General: Skin is warm.  Neurological:     Mental Status: She is alert.     ED Results / Procedures / Treatments   Labs (all labs ordered are listed, but only abnormal results are displayed) Labs Reviewed  BASIC METABOLIC PANEL - Abnormal; Notable for the following components:      Result Value   Chloride 96 (*)    Glucose, Bld 175 (*)    BUN 26 (*)    Creatinine, Ser 6.25 (*)    GFR, Estimated 8 (*)    Anion gap 19 (*)    All other components within normal limits   CBC - Abnormal; Notable for the following components:   RBC 2.22 (*)    Hemoglobin 6.4 (*)    HCT 21.0 (*)    RDW 15.9 (*)    All other components within normal limits  TROPONIN I (HIGH SENSITIVITY) - Abnormal; Notable for the following components:   Troponin I (High Sensitivity) 50 (*)    All other components within normal limits  OCCULT BLOOD X 1 CARD TO LAB, STOOL  PROTIME-INR  HEPATIC FUNCTION PANEL  POC OCCULT BLOOD, ED  TYPE AND SCREEN  TROPONIN I (HIGH SENSITIVITY)    EKG EKG Interpretation  Date/Time:  Wednesday Dec 23 2022 13:42:16 EDT Ventricular Rate:  81 PR Interval:  166 QRS Duration: 92 QT Interval:  388 QTC Calculation: 450 R Axis:   94 Text Interpretation: Normal sinus rhythm Rightward axis Cannot rule out Anterior infarct , age undetermined Abnormal ECG When compared with ECG of 12-Oct-2022 16:54,  Peaked T waves improved Confirmed by Benjiman Core [phone]) on 12/23/2022 3:28:08 PM  Radiology DG Chest 1 View  Result Date: 12/23/2022 CLINICAL DATA:  Chest pain EXAM: CHEST  1 VIEW COMPARISON:  Chest x-ray 10/12/2022 FINDINGS: Right chest port catheter tip projects over the distal SVC. The heart is enlarged. The lungs and costophrenic angles are clear. There is no pneumothorax or acute fracture. IMPRESSION: Cardiomegaly with no acute pulmonary process. Electronically Signed   By: Darliss Cheney M.D.   On: 12/23/2022 15:12    Procedures Procedures    Medications Ordered in ED Medications - No data to display  ED Course/ Medical Decision Making/ A&P                             Medical Decision Making Amount and/or Complexity of Data Reviewed Labs: ordered.  Patient with anemia.  Several different causes for bleeding including nosebleeds GI bleed and recent surgery for bleeding fistula.  Most recent hemoglobin was 8.  Is anemic.  Will require admission to the hospital.  Reviewed note from recent discharge.  Hemoglobin is 6.  Various sources of  bleeding potentially.  Blood pressure good however will require admission to the hospital.  Will discuss with internal medicine residents.  CRITICAL CARE Performed by: Benjiman Core Total critical care time: 30 minutes Critical care time was exclusive of separately billable procedures and treating other patients. Critical care was necessary to treat or prevent imminent or life-threatening deterioration. Critical care was time spent personally by me on the following activities: development of treatment plan with patient and/or surrogate as well as nursing, discussions with consultants, evaluation of patient's response to treatment, examination of patient, obtaining history from patient or surrogate, ordering and performing treatments and interventions, ordering and review of laboratory studies, ordering and review of radiographic studies, pulse oximetry and re-evaluation of patient's condition.          Final Clinical Impression(s) / ED Diagnoses Final diagnoses:  Symptomatic anemia  End stage renal disease on dialysis [facility])    Rx / DC Orders ED Discharge Orders     None         Benjiman Core, MD 12/23/22 1650

## 2022-12-23 NOTE — ED Notes (Signed)
Pt is a&ox4, warm and dry to touch. Pt complains of cp/shob/weakness. Pt is currently in her wheelchair talking to a nurse on the phone regarding an appointment. I advised pt that she will need to be in the bed, and she stated that she would move herself there.

## 2022-12-23 NOTE — ED Notes (Signed)
Pt was able to get herself into the bed. Changed into a gown, attached to monitor/vitals. Side rails up x 2, call light within reach.

## 2022-12-23 NOTE — ED Notes (Signed)
Waiting on IV team for access.

## 2022-12-24 DIAGNOSIS — Z992 Dependence on renal dialysis: Secondary | ICD-10-CM

## 2022-12-24 DIAGNOSIS — K625 Hemorrhage of anus and rectum: Secondary | ICD-10-CM

## 2022-12-24 DIAGNOSIS — Z794 Long term (current) use of insulin: Secondary | ICD-10-CM | POA: Diagnosis not present

## 2022-12-24 DIAGNOSIS — I770 Arteriovenous fistula, acquired: Secondary | ICD-10-CM | POA: Diagnosis not present

## 2022-12-24 DIAGNOSIS — D62 Acute posthemorrhagic anemia: Secondary | ICD-10-CM | POA: Diagnosis not present

## 2022-12-24 DIAGNOSIS — K5909 Other constipation: Secondary | ICD-10-CM | POA: Diagnosis not present

## 2022-12-24 DIAGNOSIS — N186 End stage renal disease: Secondary | ICD-10-CM

## 2022-12-24 DIAGNOSIS — D631 Anemia in chronic kidney disease: Secondary | ICD-10-CM | POA: Diagnosis not present

## 2022-12-24 DIAGNOSIS — I120 Hypertensive chronic kidney disease with stage 5 chronic kidney disease or end stage renal disease: Secondary | ICD-10-CM | POA: Diagnosis not present

## 2022-12-24 DIAGNOSIS — R1084 Generalized abdominal pain: Secondary | ICD-10-CM | POA: Diagnosis not present

## 2022-12-24 DIAGNOSIS — E1122 Type 2 diabetes mellitus with diabetic chronic kidney disease: Secondary | ICD-10-CM | POA: Diagnosis not present

## 2022-12-24 DIAGNOSIS — K922 Gastrointestinal hemorrhage, unspecified: Secondary | ICD-10-CM | POA: Diagnosis not present

## 2022-12-24 DIAGNOSIS — D649 Anemia, unspecified: Secondary | ICD-10-CM | POA: Diagnosis not present

## 2022-12-24 DIAGNOSIS — N250 Renal osteodystrophy: Secondary | ICD-10-CM | POA: Diagnosis not present

## 2022-12-24 DIAGNOSIS — I2510 Atherosclerotic heart disease of native coronary artery without angina pectoris: Secondary | ICD-10-CM | POA: Diagnosis not present

## 2022-12-24 DIAGNOSIS — B20 Human immunodeficiency virus [HIV] disease: Secondary | ICD-10-CM

## 2022-12-24 LAB — CBC
HCT: 22.2 % — ABNORMAL LOW (ref 36.0–46.0)
Hemoglobin: 6.8 g/dL — CL (ref 12.0–15.0)
MCH: 29.2 pg (ref 26.0–34.0)
MCHC: 30.6 g/dL (ref 30.0–36.0)
MCV: 95.3 fL (ref 80.0–100.0)
Platelets: 234 10*3/uL (ref 150–400)
RBC: 2.33 MIL/uL — ABNORMAL LOW (ref 3.87–5.11)
RDW: 15.9 % — ABNORMAL HIGH (ref 11.5–15.5)
WBC: 4.1 10*3/uL (ref 4.0–10.5)
nRBC: 0 % (ref 0.0–0.2)

## 2022-12-24 LAB — RENAL FUNCTION PANEL
Albumin: 2.9 g/dL — ABNORMAL LOW (ref 3.5–5.0)
Anion gap: 19 — ABNORMAL HIGH (ref 5–15)
BUN: 36 mg/dL — ABNORMAL HIGH (ref 6–20)
CO2: 19 mmol/L — ABNORMAL LOW (ref 22–32)
Calcium: 8.4 mg/dL — ABNORMAL LOW (ref 8.9–10.3)
Chloride: 98 mmol/L (ref 98–111)
Creatinine, Ser: 7.23 mg/dL — ABNORMAL HIGH (ref 0.44–1.00)
GFR, Estimated: 7 mL/min — ABNORMAL LOW (ref >60)
Glucose, Bld: 78 mg/dL (ref 70–99)
Phosphorus: 5.9 mg/dL — ABNORMAL HIGH (ref 2.5–4.6)
Potassium: 4.5 mmol/L (ref 3.5–5.1)
Sodium: 136 mmol/L (ref 135–145)

## 2022-12-24 LAB — GLUCOSE, CAPILLARY
Glucose-Capillary: 115 mg/dL — ABNORMAL HIGH (ref 70–99)
Glucose-Capillary: 150 mg/dL — ABNORMAL HIGH (ref 70–99)
Glucose-Capillary: 155 mg/dL — ABNORMAL HIGH (ref 70–99)
Glucose-Capillary: 191 mg/dL — ABNORMAL HIGH (ref 70–99)
Glucose-Capillary: 81 mg/dL (ref 70–99)

## 2022-12-24 LAB — HEPATITIS B SURFACE ANTIGEN: Hepatitis B Surface Ag: NONREACTIVE

## 2022-12-24 LAB — PREPARE RBC (CROSSMATCH)

## 2022-12-24 LAB — MRSA NEXT GEN BY PCR, NASAL: MRSA by PCR Next Gen: NOT DETECTED

## 2022-12-24 LAB — TYPE AND SCREEN
ABO/RH(D): B POS
Unit division: 0

## 2022-12-24 LAB — BPAM RBC
Blood Product Expiration Date: 202406202359
ISSUE DATE / TIME: 202405161622

## 2022-12-24 LAB — OCCULT BLOOD X 1 CARD TO LAB, STOOL: Fecal Occult Bld: POSITIVE — AB

## 2022-12-24 MED ORDER — CHLORHEXIDINE GLUCONATE CLOTH 2 % EX PADS
6.0000 | MEDICATED_PAD | Freq: Every day | CUTANEOUS | Status: DC
  Administered 2022-12-25: 6 via TOPICAL

## 2022-12-24 MED ORDER — ONDANSETRON HCL 4 MG/2ML IJ SOLN
4.0000 mg | Freq: Once | INTRAMUSCULAR | Status: AC
  Administered 2022-12-24: 4 mg via INTRAVENOUS
  Filled 2022-12-24: qty 2

## 2022-12-24 MED ORDER — POLYETHYLENE GLYCOL 3350 17 G PO PACK
17.0000 g | PACK | Freq: Two times a day (BID) | ORAL | Status: DC
  Administered 2022-12-24 – 2022-12-25 (×2): 17 g via ORAL
  Filled 2022-12-24 (×2): qty 1

## 2022-12-24 MED ORDER — ONDANSETRON HCL 4 MG/2ML IJ SOLN
4.0000 mg | Freq: Three times a day (TID) | INTRAMUSCULAR | Status: DC | PRN
  Administered 2022-12-25 (×2): 4 mg via INTRAVENOUS
  Filled 2022-12-24 (×3): qty 2

## 2022-12-24 MED ORDER — SODIUM CHLORIDE 0.9% IV SOLUTION: Freq: Once | INTRAVENOUS | Status: DC

## 2022-12-24 MED ORDER — CALCIUM ACETATE (PHOS BINDER) 667 MG PO CAPS
1334.0000 mg | ORAL_CAPSULE | Freq: Three times a day (TID) | ORAL | Status: DC
  Administered 2022-12-25 (×3): 1334 mg via ORAL
  Filled 2022-12-24 (×3): qty 2

## 2022-12-24 MED ORDER — HYDROMORPHONE HCL 1 MG/ML IJ SOLN
0.5000 mg | INTRAMUSCULAR | Status: DC | PRN
  Administered 2022-12-24 (×3): 0.5 mg via INTRAVENOUS
  Filled 2022-12-24 (×3): qty 0.5

## 2022-12-24 MED ORDER — HEPARIN SODIUM (PORCINE) 1000 UNIT/ML IJ SOLN
INTRAMUSCULAR | Status: AC
  Administered 2022-12-24: 3600 [IU]
  Filled 2022-12-24: qty 4

## 2022-12-24 MED ORDER — DARBEPOETIN ALFA 200 MCG/0.4ML IJ SOSY
200.0000 ug | PREFILLED_SYRINGE | INTRAMUSCULAR | Status: DC
  Filled 2022-12-24: qty 0.4

## 2022-12-24 MED ORDER — CALCITRIOL 0.5 MCG PO CAPS: 1.0000 ug | ORAL_CAPSULE | ORAL | Status: DC

## 2022-12-24 NOTE — TOC Initial Note (Signed)
Transition of Care [facility]) - Initial/Assessment Note    Patient Details  Name: Kathy Frank MRN: [id number] Date of Birth: 1976-03-16  [facility] CM/SW Contact:    Janae Bridgeman, RN Phone Number: 12/24/2022, 11:32 AM  Clinical Narrative:                 CM met with the patient at the bedside to discuss TOC needs.  UR RN called and states that patient will need Code 44 provided since the patient was changed to Observation status.  Medicare Observation/Code 44 was completed and provided to the patient at the bedside.  The patient lives with her daughter at the apartment and has WC, bedside commode and glucometer.  The patient's daughter provides transportation to appointments by private car - including HD visits on Tues, Thurs., and Saturday at 6:20 am at [facility] [place].  The patient plans to discharge home by car with family when medically stable.  The patient receives a disability check for 800 dollars plus foodstamps monthly.  The patient states that she will follow up with Medicaid caseworker for benefits and possible availability to [facility] Medicaid.  Resources included in the AVS.  I called and left a message with Verdie Mosher with [employer] to follow up regarding patient's possible need for Electric wheelchair after 5 year window of ordering manual chair.  [employer] was requested to call and speak with the patient.    PCP - patient plans [facility] regarding DME orders for electric wheelchair when she is able to qualify.  Expected Discharge Plan: Home/Self Care Barriers to Discharge: Continued Medical Work up   Patient Goals and CMS Choice Patient states their goals for this hospitalization and ongoing recovery are:: To get better and return home CMS Medicare.gov Compare Post Acute Care list provided to:: Patient Choice offered to / list presented to : Patient [place] ownership interest in [facility].provided to:: Patient    Expected Discharge Plan and Services   Discharge Planning Services: CM Consult Post Acute Care Choice: Durable Medical Equipment Living arrangements for the past 2 months: Apartment                   DME Agency: [employer] Date DME Agency Contacted: 12/24/22 Time DME Agency Contacted: 1131 Representative spoke with at DME Agency: Left message with Verdie Mosher with [employer] for follow up regarding electric wheelchair needs in OUtpatient setting            Prior Living Arrangements/Services Living arrangements for the past 2 months: Apartment Lives with:: Adult Children (LIves with daughter, Genia Harold in the home) Patient language and need for interpreter reviewed:: Yes Do you feel safe going back to the place where you live?: Yes      Need for Family Participation in Patient Care: Yes (Comment) Care giver support system in place?: Yes (comment)   Criminal Activity/Legal Involvement Pertinent to Current Situation/Hospitalization: No - Comment as needed  Activities of Daily Living Home Assistive Devices/Equipment: Wheelchair ADL Screening (condition at time of admission) Patient's cognitive ability adequate to safely complete daily activities?: Yes Is the patient deaf or have difficulty hearing?: No Does the patient have difficulty seeing, even when wearing glasses/contacts?: No Does the patient have difficulty concentrating, remembering, or making decisions?: No Patient able to express need for assistance with ADLs?: No Does the patient have difficulty dressing or bathing?: No Independently performs ADLs?: Yes (appropriate for developmental age) Does  the patient have difficulty walking or climbing stairs?: Yes Weakness of Legs: Both (bilat amputation) Weakness of Arms/Hands: None  Permission Sought/Granted Permission sought to share information with : Case Manager, Magazine features editor Permission granted to share information with :  Yes, Verbal Permission Granted              Emotional Assessment Appearance:: Appears stated age Attitude/Demeanor/Rapport: Gracious Affect (typically observed): Accepting Orientation: : Oriented to Self, Oriented to Place, Oriented to  Time, Oriented to Situation Alcohol / Substance Use: Not Applicable Psych Involvement: No (comment)  Admission diagnosis:  Acute blood loss anemia [D62] End stage renal disease on dialysis (HCC) [N18.6, Z99.2] Symptomatic anemia [D64.9] Patient Active Problem List   Diagnosis Date Noted   Acute blood loss anemia 12/23/2022   A-V fistula (HCC) 12/23/2022   Dependence on wheelchair 11/13/2022   Blister 10/29/2022   Anemia, posthemorrhagic, acute 10/14/2022   High anion gap metabolic acidosis 10/13/2022   Acute gastric ulcer with hemorrhage 10/13/2022   Hyperkalemia 10/12/2022   Right-sided chest pain 10/01/2022   CAP (community acquired pneumonia) 09/29/2022   Homelessness 08/26/2022   Migraine with aura 06/29/2022   Community acquired pneumonia 06/25/2022   Hypertensive emergency 11/09/2021   Requires assistance with activities of daily living (ADL) 10/27/2021   Analgesic overuse headache 06/28/2021   Urinary and fecal incontinence 05/30/2021   Epistaxis 05/27/2021   Status post coronary artery stent placement    Coronary artery disease involving native coronary artery of native heart with unstable angina pectoris (HCC)    Hypertensive urgency 05/20/2021   History of non-ST elevation myocardial infarction (NSTEMI) 05/20/2021   BKA stump complication (HCC) 04/18/2021   Right below-knee amputee (HCC) 02/08/2021   Peripheral arterial disease (HCC) 01/29/2021   Anxiety 05/28/2020   Patient's other noncompliance with medication regimen 12/28/2019   Major depressive disorder, single episode, unspecified 04/24/2019   Secondary hyperparathyroidism of renal origin (HCC) 04/24/2019   Abdominal pain 02/06/2019   S/P AKA (above knee amputation),  left (HCC) 12/16/2018   Anemia due to chronic kidney disease    Moderate protein-calorie malnutrition (HCC)    Adjustment disorder with mixed anxiety and depressed mood 08/15/2018   ESRD on dialysis (HCC)    Fecal incontinence 02/04/2018   Aortic atherosclerosis (HCC) 09/30/2016   Human immunodeficiency virus (HIV) disease (HCC) 04/23/2016   Gastroparesis    Moderate nonproliferative diabetic retinopathy of both eyes (HCC) 11/21/2014   Esophageal reflux    Essential hypertension 11/16/2013   Anemia 11/15/2013   Depression 06/28/2006   Diabetes mellitus type 2 in obese 06/28/1994   PCP:  Quincy Simmonds, MD Pharmacy:   [id number] - [address] ([place]), [place] - [address] [place] ([place]) [place] [address] Phone: [phone] Fax: [phone]  [place] [facility] [address] [place] [address] Phone: [phone] Fax: [phone]  [facility] [address] [place] [address] Phone: [phone] Fax: [phone]     Social Determinants of Health (SDOH) Social History: SDOH Screenings   Food Insecurity: No Food Insecurity (12/23/2022)  Housing: Low Risk  (12/23/2022)  Recent Concern: Housing - Medium Risk (10/19/2022)  Transportation Needs: No Transportation Needs (12/23/2022)  Recent Concern: Transportation Needs - Unmet Transportation Needs (11/11/2022)  Utilities: Not At Risk (12/23/2022)  Alcohol Screen: Low Risk  (10/19/2022)  Depression (PHQ2-9): High Risk (11/03/2022)  Financial Resource Strain: Low Risk  (10/19/2022)  Physical Activity: Inactive (10/19/2022)  Social  Connections: Socially Isolated (10/19/2022)  Stress: Stress Concern Present (10/19/2022)  Tobacco Use: Medium Risk (12/23/2022)   SDOH Interventions:     Readmission Risk Interventions    12/24/2022   11:30 AM 05/23/2021    2:13 PM 04/22/2021    3:05 PM  Readmission Risk Prevention Plan   Transportation Screening Complete Complete Complete  Medication Review Oceanographer) Complete Complete Complete  PCP or Specialist appointment within 3-5 days of discharge Complete Complete Complete  HRI or Home Care Consult Complete Complete Complete  SW Recovery Care/Counseling Consult Complete Complete Complete  Palliative Care Screening Not Applicable Not Applicable Not Applicable  Skilled Nursing Facility Not Applicable Not Applicable Not Applicable

## 2022-12-24 NOTE — Consult Note (Addendum)
Consultation  Referring Provider:  [facility]  Primary Care Physician:  Quincy Simmonds, MD Primary Gastroenterologist:  Gentry Fitz       Reason for Consultation: acute on chronic anemia, rectal bleeding      LOS: 1 day          HPI:   Kathy Frank is a 47 y.o. female with past medical history significant for ESRD on HD, diabetes type 2, bilateral lower amputations, HIV, NSAID use, acute on chronic anemia, presents for evaluation of rectal bleeding.  Patient was at dialysis yesterday and was called about hgb of 5 and recommended to go to ED. patient reports history of nosebleeds.  She states she had one last Monday 5/6.  Had a recurrent nosebleed Saturday 5/11.  States when she has nosebleeds they typically last for few hours with significant amount of blood and clotting.  She states Saturday her nosebleed last from 3 AM to 9:30 PM.  Patient states she has intermittent rectal bleeding.  States maybe about once a week she will have minimal amount of bright red blood/mucousy blood on tissue paper.  States this amount of blood is less than a dime size.  Last time was 3 days ago.  Hgb 6.8 (baseline 8-9).  BUN 36, creatinine 7.23, GFR 7.  Positive fecal occult  She states she has a history of constipation for which she uses miralax. She was recently visiting family in [place] (returned 5/12) and did not have a bathroom that was wheelchair accessible so she states she held in her bowel movements. Since then she has had abdominal discomfort. She was not able to take her miralax on the trip either.  Denies weight loss. Denies melena. No recent NSAID use. While in [place] she did have AV fistula revision.  PREVIOUS WORKUP  EGD 10/14/2022 with Dr. Adela Lank for melena with history of Goody powders: LA grade B reflux esophagitis, nonbleeding gastric ulcers with clean ulcer base, nonbleeding gastric ulcer with adherent clot, clips placed.  Normal duodenum.  Past Medical History:  Diagnosis Date    Anal abscess    chronic   Anxiety    CAD (coronary artery disease)    CVA (cerebral vascular accident) (HCC)    Depression 06/28/2006   Qualifier: Diagnosis of  By: Laveda Abbe MD, Todd     Diabetes mellitus type 2 in obese 06/28/1994   Dyspnea    uses oxygeb 2 liters per minute at dialysis   End stage renal disease on dialysis [facility])    on hemodialysis T/Th/Sat   Erosive esophagitis    Esophageal reflux    Eye redness    Gastroparesis    ? diabetic   GERD (gastroesophageal reflux disease)    History of blood transfusion 2019   Human immunodeficiency virus (HIV) disease (HCC) 04/23/2016   Hyperlipidemia    Hypertension    Metabolic bone disease 05/02/2019   Moderate nonproliferative diabetic retinopathy of both eyes (HCC) 11/21/2014   11/14/14: Noted on retinal imaging; needs follow-up imaging in 6 months  05/22/16: Noted on retinal imaging again; needs follow-up imaging in 6 months   PAD (peripheral artery disease) (HCC)    Type 2 diabetes mellitus with diabetic peripheral angiopathy without gangrene (HCC) 05/01/2019   Wears dentures    lower   Wound infection s/p L transmetatarsal amputation     Surgical History:  She  has a past surgical history that includes Tubal ligation (2002); Lower Extremity Angiography (N/A, 07/05/2018); PERIPHERAL VASCULAR BALLOON ANGIOPLASTY (  Left, 07/05/2018); Amputation (Left, 07/08/2018); Amputation (Left, 08/09/2018); Amputation (Left, 10/08/2018); AV fistula placement (Left, 10/14/2018); Stump revision (Left, 10/19/2018); Amputation (Left, 10/28/2018); Amputation (Left, 12/16/2018); Bascilic vein transposition (Left, 04/14/2019); Insertion of dialysis catheter (Right, 04/14/2019); Incision and drainage abscess (N/A, 10/24/2020); Rectal exam under anesthesia (N/A, 10/24/2020); ABDOMINAL AORTOGRAM W/LOWER EXTREMITY (N/A, 12/13/2020); PERIPHERAL VASCULAR BALLOON ANGIOPLASTY (Right, 12/13/2020); PERIPHERAL VASCULAR ATHERECTOMY (Right, 12/13/2020); Amputation (Right,  01/31/2021); Amputation (Right, 03/28/2021); Amputation (Right, 04/18/2021); LEFT HEART CATH AND CORONARY ANGIOGRAPHY (N/A, 05/21/2021); CORONARY STENT INTERVENTION (N/A, 05/21/2021); IR Fluoro Guide CV Line Right (10/13/2022); IR US Guide Vasc Access Right (10/13/2022); IR Removal Tun Cv Cath W/O FL (10/16/2022); Esophagogastroduodenoscopy (egd) with propofol (N/A, 10/14/2022); Hemostasis clip placement (10/14/2022); and biopsy (10/14/2022). Family History:  Her family history includes Diabetes in her brother, daughter, daughter, maternal grandmother, and mother; Mental retardation in her brother. Social History:   reports that she quit smoking about 8 years ago. Her smoking use included cigarettes. She started smoking about 9 years ago. She has a 1.00 pack-year smoking history. She has never used smokeless tobacco. She reports that she does not currently use alcohol. She reports current drug use. Drug: Marijuana.  Prior to Admission medications   Medication Sig Start Date End Date Taking? Authorizing Provider  albuterol (VENTOLIN HFA) 108 (90 Base) MCG/ACT inhaler Inhale 2 puffs into the lungs every 4 (four) hours as needed for wheezing or shortness of breath. 02/14/21  Yes Angiulli, Mcarthur Rossetti, PA-C  amLODipine (NORVASC) 10 MG tablet Take 1 tablet (10 mg total) by mouth daily. 10/17/22  Yes Rocky Morel, DO  atorvastatin (LIPITOR) 80 MG tablet Take 1 tablet (80 mg total) by mouth daily. 11/03/22  Yes Adron Bene, MD  bictegravir-emtricitabine-tenofovir AF (BIKTARVY) 50-200-25 MG TABS tablet Take 1 tablet by mouth daily. OVERDUE FOR OFFICE FOLLOW UP - PLEASE CALL OUR OFFICE TO SCHEDULE [phone] Patient taking differently: Take 1 tablet by mouth daily. 08/26/22  Yes Cliffton Asters, MD  calcitRIOL (ROCALTROL) 0.25 MCG capsule Take 5 capsules (1.25 mcg total) by mouth Every Tuesday,Thursday,and Saturday with dialysis. 07/02/22  Yes Marolyn Haller, MD  calcium acetate (PHOSLO) 667 MG capsule Take 2 capsules (1,334  mg total) by mouth 3 (three) times daily with meals. 06/30/22  Yes Marolyn Haller, MD  cyclobenzaprine (FLEXERIL) 5 MG tablet Take 5 mg by mouth daily as needed for muscle spasms. 10/17/22  Yes [provider]  diclofenac Sodium (VOLTAREN) 1 % GEL Apply 4 g topically 4 (four) times daily. Patient taking differently: Apply 4 g topically daily as needed (for pain). 10/02/22  Yes Adron Bene, MD  EQ ASPIRIN ADULT LOW DOSE 81 MG tablet TAKE 1 TABLET BY MOUTH ONCE DAILY. SWALLOW WHOLE. Patient taking differently: Take 81 mg by mouth daily. 02/06/22  Yes Maeola Harman, MD  escitalopram (LEXAPRO) 5 MG tablet Take 1 tablet (5 mg total) by mouth daily. 11/03/22  Yes Adron Bene, MD  gabapentin (NEURONTIN) 100 MG capsule Take 100 mg by mouth at bedtime. 09/10/22  Yes [provider]  hydrocortisone-pramoxine (PROCTOFOAM-HC) rectal foam Place 1 applicator rectally 2 (two) times daily. 11/07/22  Yes Dione Booze, MD  insulin glargine (LANTUS SOLOSTAR) 100 UNIT/ML Solostar Pen Inject 15 Units into the skin at bedtime. 11/03/22  Yes Adron Bene, MD  lidocaine (LIDODERM) 5 % Place 1 patch onto the skin daily. Remove & Discard patch within 12 hours or as directed by MD Patient taking differently: Place 1 patch onto the skin daily as needed (For pain). 10/03/22  Yes  Adron Bene, MD  liraglutide (VICTOZA) 18 MG/3ML SOPN Inject 0.6 mg into the skin daily. 11/03/22 10/29/23 Yes Adron Bene, MD  LOKELMA 5 g packet Take 5 g by mouth daily. 07/30/22  Yes [provider]  multivitamin (RENA-VIT) TABS tablet Take 1 tablet by mouth daily. Patient taking differently: Take 1 tablet by mouth See admin instructions. On Tuesday, Wednesday and Thursday 02/14/21  Yes Angiulli, Mcarthur Rossetti, PA-C  pantoprazole (PROTONIX) 40 MG tablet Take 1 tablet (40 mg total) by mouth 2 (two) times daily for 28 days, THEN 1 tablet (40 mg total) daily. 10/16/22 02/11/23 Yes Rocky Morel, DO  Ubrogepant  (UBRELVY) 50 MG TABS Take 1 tablet (50 mg total) by mouth as needed (migraine headache). if needed a second dose may be taken at least 2 hours after the initial dose; MAX 8 doses in a month 11/24/22  Yes Quincy Simmonds, MD  Accu-Chek Softclix Lancets lancets Use as instructed 05/02/21   Chauncey Mann, DO  Continuous Blood Gluc Receiver (DEXCOM G7 RECEIVER) DEVI Use to check blood sugars cntinuously 06/30/22   Marolyn Haller, MD  Continuous Blood Gluc Sensor (DEXCOM G7 SENSOR) MISC Use to check blood sugar continuously 06/30/22   Marolyn Haller, MD  Darbepoetin Alfa (ARANESP) 200 MCG/0.4ML SOSY injection Inject 0.4 mLs (200 mcg total) into the skin every Monday at 6 PM. Patient taking differently: Inject 200 mcg into the skin every 7 (seven) days. Every Monday 07/06/22   Marolyn Haller, MD  glucose blood test strip Use as instructed 05/02/21   Atway, Rayann N, DO  Insulin Pen Needle (PENTIPS) 32G X 4 MM MISC Use as directed 11/10/22   Quincy Simmonds, MD  polyethylene glycol (MIRALAX / GLYCOLAX) 17 g packet Take 17 g by mouth daily. Patient not taking: Reported on 10/16/2022 07/01/22   Marolyn Haller, MD  topiramate (TOPAMAX) 25 MG tablet Take 1 tablet (25 mg total) by mouth daily for 7 days, THEN 1 tablet (25 mg total) 2 (two) times daily for 7 days. 10/16/22 10/30/22  Rocky Morel, DO    Current Facility-Administered Medications  Medication Dose Route Frequency Provider Last Rate Last Admin   acetaminophen (TYLENOL) tablet 1,000 mg  1,000 mg Oral Q6H Marrianne Mood, MD   1,000 mg at 12/24/22 1156   albuterol (PROVENTIL) (2.5 MG/3ML) 0.083% nebulizer solution 2.5 mg  2.5 mg Nebulization Q4H PRN Reome, Earle J, RPH       amLODipine (NORVASC) tablet 10 mg  10 mg Oral Daily Marrianne Mood, MD   10 mg at 12/24/22 1029   atorvastatin (LIPITOR) tablet 80 mg  80 mg Oral Daily Marrianne Mood, MD   80 mg at 12/24/22 1029   bictegravir-emtricitabine-tenofovir AF (BIKTARVY) 50-200-25 MG per  tablet 1 tablet  1 tablet Oral Daily Marrianne Mood, MD   1 tablet at 12/24/22 1028   [START ON 12/25/2022] Chlorhexidine Gluconate Cloth 2 % PADS 6 each  6 each Topical Q0600 Delano Metz, MD       escitalopram (LEXAPRO) tablet 5 mg  5 mg Oral Daily Marrianne Mood, MD   5 mg at 12/24/22 1028   gabapentin (NEURONTIN) capsule 100 mg  100 mg Oral QHS Marrianne Mood, MD   100 mg at 12/23/22 2300   HYDROmorphone (DILAUDID) injection 0.5 mg  0.5 mg Intravenous Q4H PRN Marrianne Mood, MD   0.5 mg at 12/24/22 1156   insulin aspart (novoLOG) injection 0-9 Units  0-9 Units Subcutaneous TID WC Evlyn Kanner, MD  insulin glargine-yfgn (SEMGLEE) injection 15 Units  15 Units Subcutaneous QHS Marrianne Mood, MD   15 Units at 12/23/22 2301   pantoprazole (PROTONIX) EC tablet 40 mg  40 mg Oral Daily Marrianne Mood, MD   40 mg at 12/24/22 1029   polyethylene glycol (MIRALAX / GLYCOLAX) packet 17 g  17 g Oral Daily Marrianne Mood, MD   17 g at 12/24/22 1028    Allergies as of 12/23/2022   (No Known Allergies)    Review of Systems  Constitutional:  Negative for chills, fever and weight loss.  HENT:  Negative for hearing loss and tinnitus.   Eyes:  Negative for blurred vision and double vision.  Respiratory:  Negative for cough and hemoptysis.   Cardiovascular:  Negative for chest pain and palpitations.  Gastrointestinal:  Positive for abdominal pain, blood in stool, constipation and nausea. Negative for diarrhea, heartburn, melena and vomiting.  Genitourinary:  Negative for dysuria and urgency.  Musculoskeletal:  Negative for myalgias and neck pain.  Skin:  Negative for itching and rash.  Neurological:  Negative for seizures and loss of consciousness.  Psychiatric/Behavioral:  Negative for depression and suicidal ideas.        Physical Exam:  Vital signs in last 24 hours: Temp:  [98 F (36.7 C)-99.6 F (37.6 C)] 98.5 F (36.9 C) (05/16 0755) Pulse Rate:  [73-80] 73  (05/16 0755) Resp:  [17-18] 18 (05/16 0755) BP: (162-181)/(69-76) 178/72 (05/16 0755) SpO2:  [99 %-100 %] 99 % (05/16 0755) Last BM Date : 12/24/22 Last BM recorded by nurses in past 5 days Stool Type: Type 3 (Sausage shape with surface cracks) (12/24/2022  7:00 AM)  Physical Exam Constitutional:      General: She is not in acute distress. HENT:     Head: Normocephalic and atraumatic.     Nose: Nose normal. No congestion.     Mouth/Throat:     Mouth: Mucous membranes are moist.     Pharynx: Oropharynx is clear.  Eyes:     Extraocular Movements: Extraocular movements intact.     Comments: Pale conjunctiva   Cardiovascular:     Rate and Rhythm: Normal rate and regular rhythm.  Pulmonary:     Effort: Pulmonary effort is normal. No respiratory distress.  Abdominal:     General: Abdomen is flat. Bowel sounds are normal. There is no distension.     Palpations: Abdomen is soft. There is no mass.     Tenderness: There is no abdominal tenderness. There is no guarding or rebound.     Hernia: No hernia is present.  Genitourinary:    Comments: External hemorrhoid, nonthrombosed/nonbleeding.  No stool in vault.  Brown stool noted in undergarment. Musculoskeletal:     Cervical back: Normal range of motion.     Comments: Bilateral lower amputation  Skin:    General: Skin is warm and dry.     Coloration: Skin is not jaundiced.  Neurological:     General: No focal deficit present.     Mental Status: She is alert and oriented to person, place, and time.  Psychiatric:        Mood and Affect: Mood normal.        Behavior: Behavior normal.        Thought Content: Thought content normal.        Judgment: Judgment normal.      LAB RESULTS: Recent Labs    12/23/22 1430 12/24/22 0732  WBC 4.5 4.1  HGB 6.4* 6.8*  HCT 21.0* 22.2*  PLT 302 234   BMET Recent Labs    12/23/22 1430 12/24/22 0732  NA 140 136  K 4.1 4.5  CL 96* 98  CO2 25 19*  GLUCOSE 175* 78  BUN 26* 36*   CREATININE 6.25* 7.23*  CALCIUM 9.1 8.4*   LFT Recent Labs    12/23/22 1623 12/24/22 0732  PROT 8.3*  --   ALBUMIN 2.8* 2.9*  AST 12*  --   ALT 7  --   ALKPHOS 49  --   BILITOT 0.2*  --   BILIDIR <0.1  --   IBILI NOT CALCULATED  --    PT/INR Recent Labs    12/23/22 1623  LABPROT 14.6  INR 1.1    STUDIES: DG Chest 1 View  Result Date: 12/23/2022 CLINICAL DATA:  Chest pain EXAM: CHEST  1 VIEW COMPARISON:  Chest x-ray 10/12/2022 FINDINGS: Right chest port catheter tip projects over the distal SVC. The heart is enlarged. The lungs and costophrenic angles are clear. There is no pneumothorax or acute fracture. IMPRESSION: Cardiomegaly with no acute pulmonary process. Electronically Signed   By: Darliss Cheney M.D.   On: 12/23/2022 15:12      Impression    Acute on chronic anemia Hematochezia History of NSAID induced gastric ulcers -Hgb 6.8 (baseline 8-9) Suspect acute on chronic anemia more likely related to significant blood loss with epistaxis since patient describes hematochezia as rare and minimal. Hematochezia possibly hemorrhoidal with such minimal amount, evidence of hemorrhoids, and history of constipation.  Constipation - recently worsened with not having access to bathroom, noncompliance with miralax, and holding in bowel movements. Suspect this may be the source of abdominal discomfort.  ESRD on dialysis -BUN 36, creatinine 7.23, GFR 7 HIV   Plan   - Miralax 17g BID  - Continue daily CBC and transfuse as needed to maintain HGB > 7   - last episode of rectal bleeding was 3 days ago, minimal. No indication for colonoscopy at this time with no further bleeding. Patient will need screening colonoscopy as an outpatient due to age at some point.  - patient will need EGD at some point to document healing of ulcers whether it be inpatient vs outpatient. No melena at this time  Thank you for your kind consultation, we will continue to follow.   Bayley Leanna Sato  12/24/2022, 1:04 PM   I have taken an interval history, thoroughly reviewed the chart and examined the patient. I agree with the Advanced Practitioner's note, impression and recommendations, and have recorded additional findings, impressions and recommendations below. I performed a substantive portion of this encounter (>50% time spent), including a complete performance of the medical decision making.  My additional thoughts are as follows:  Multifactorial anemia of chronic kidney disease exacerbated by blood loss from frequent epistaxis and a lesser component of what sounds like small-volume benign anorectal bleeding such as hemorrhoids.  More may also be ongoing occult GI blood loss and the known gastric ulcers diagnosed 2 months ago.  She was H. pylori negative, so these were presumably related to her dialysis state, and we need to be certain she is on no aspirin or NSAIDs.  Chronic constipation, also multifactorial given her medical conditions, limited mobility and medications.  No current plans for endoscopic testing on this patient.  She requires transfusion, IV iron, twice daily oral acid suppression, and we will arrange outpatient follow-up with Dr. Adela Lank.  Regarding her constipation, I would consider the  use of a Dulcolax suppository every 2 or 3 days in addition to her daily MiraLAX to help avoid obstipation.  Appreciate the consult, inpatient GI team signing off, call as needed.  Charlie Pitter III Office:[phone]

## 2022-12-24 NOTE — Care Management Obs Status (Cosign Needed)
MEDICARE OBSERVATION STATUS NOTIFICATION   Patient Details  Name: Kathy Frank MRN: [id number] Date of Birth: 1976/06/12   Medicare Observation Status Notification Given:  Yes    Janae Bridgeman, RN 12/24/2022, 11:04 AM

## 2022-12-24 NOTE — Consult Note (Signed)
Hospital Consult    Reason for Consult:  possible infected left arm graft Requesting Physician:  Carlynn Purl MD MRN #:  [id number]  History of Present Illness: Kathy Frank is a 47 y.o. female with a past medical history of CAD, CVA, DMII, ESRD on dialysis, HTN, HLD, and PAD who presented to the hospital yesterday with anemia.  She was found to have a hemoglobin of 5 at dialysis yesterday.  She had a history of 2 prolonged episodes of epistaxis last week and also intermittent hematochezia.  She has a history of left basilic fistula creation in March 2020 with superficialization in September 2020.   We were consulted to evaluate the patient's left arm access.  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  She was recently traveling in [place] and due to prolonged bleeding she ultimately required revision of her fistula on 12/02/2022.  Multiple aneurysmal areas of her fistula were resected and a new left upper arm AVG was tunneled along her lateral bicep.  A right IJ TDC was also placed.  She has been getting dialysis through this catheter in the meantime.  There is concern that she has a possible infected left arm graft.  The patient states she has had some left arm swelling and pain since surgery but this has been tolerable.  Her swelling has gone down with time.  The pain in her arm is sporadic and usually along her tricep.  She denies any pain at her new graft site.  She denies noticing any puslike drainage, warmth to her incisions, or fevers.  She noticed a few days ago that one of her incisions opened up.  She has had some occasional serosanguineous drainage from this opening.  She denies any symptoms of steal in the left hand.   Past Medical History:  Diagnosis Date   Anal abscess    chronic   Anxiety    CAD (coronary artery disease)    CVA (cerebral vascular accident) (HCC)    Depression 06/28/2006   Qualifier: Diagnosis of   By: Laveda Abbe MD, Todd     Diabetes mellitus type 2 in obese 06/28/1994   Dyspnea    uses oxygeb 2 liters per minute at dialysis   End stage renal disease on dialysis [facility])    on hemodialysis T/Th/Sat   Erosive esophagitis    Esophageal reflux    Eye redness    Gastroparesis    ? diabetic   GERD (gastroesophageal reflux disease)    History of blood transfusion 2019   Human immunodeficiency virus (HIV) disease (HCC) 04/23/2016   Hyperlipidemia    Hypertension    Metabolic bone disease 05/02/2019   Moderate nonproliferative diabetic retinopathy of both eyes (HCC) 11/21/2014   11/14/14: Noted on retinal imaging; needs follow-up imaging in 6 months  05/22/16: Noted on retinal imaging again; needs follow-up imaging in 6 months   PAD (peripheral artery disease) (HCC)    Type 2 diabetes mellitus with diabetic peripheral angiopathy without gangrene (HCC) 05/01/2019   Wears dentures    lower   Wound infection s/p L transmetatarsal amputation     Past Surgical History:  Procedure Laterality Date   ABDOMINAL AORTOGRAM W/LOWER EXTREMITY N/A 12/13/2020   Procedure: ABDOMINAL AORTOGRAM W/LOWER EXTREMITY;  Surgeon: Chuck Hint, MD;  Location: [facility] INVASIVE CV LAB;  Service: Cardiovascular;  Laterality: N/A;   AMPUTATION Left 07/08/2018   Procedure: AMPUTATION FORTH RAY LEFT FOOT;  Surgeon: Nadara Mustard, MD;  Location: [facility] OR;  Service: Orthopedics;  Laterality: Left;   AMPUTATION Left 08/09/2018   Procedure: Left Transmetatarsal Amputation;  Surgeon: Nadara Mustard, MD;  Location: [facility] OR;  Service: Orthopedics;  Laterality: Left;   AMPUTATION Left 10/08/2018   Procedure: LEFT BELOW KNEE AMPUTATION;  Surgeon: Nadara Mustard, MD;  Location: [facility] OR;  Service: Orthopedics;  Laterality: Left;   AMPUTATION Left 10/28/2018   Procedure: REVISION BELOW KNEE AMPUTATION;  Surgeon: Nadara Mustard, MD;  Location: [facility] OR;  Service: Orthopedics;  Laterality: Left;   AMPUTATION Left 12/16/2018   Procedure:  LEFT ABOVE KNEE AMPUTATION;  Surgeon: Nadara Mustard, MD;  Location: [facility] OR;  Service: Orthopedics;  Laterality: Left;   AMPUTATION Right 01/31/2021   Procedure: AMPUTATION BELOW KNEE;  Surgeon: Nada Libman, MD;  Location: [facility] OR;  Service: Vascular;  Laterality: Right;   AMPUTATION Right 03/28/2021   Procedure: REVISION OF RIGHT BELOW KNEE AMPUTATION;  Surgeon: Nada Libman, MD;  Location: [facility] OR;  Service: Vascular;  Laterality: Right;   AMPUTATION Right 04/18/2021   Procedure: right below knee amputation/washout placement wound vac;  Surgeon: Leonie Douglas, MD;  Location: [facility] OR;  Service: Vascular;  Laterality: Right;   AV FISTULA PLACEMENT Left 10/14/2018   Procedure: Arteriovenous (Av) Fistula Creation Left Arm;  Surgeon: Cephus Shelling, MD;  Location: [facility] OR;  Service: Vascular;  Laterality: Left;   BASCILIC VEIN TRANSPOSITION Left 04/14/2019   Procedure: BASILIC VEIN TRANSPOSITION SECOND STAGE LEFT ARM;  Surgeon: Larina Earthly, MD;  Location: [facility] OR;  Service: Vascular;  Laterality: Left;   BIOPSY  10/14/2022   Procedure: BIOPSY;  Surgeon: Benancio Deeds, MD;  Location: [facility] ENDOSCOPY;  Service: Gastroenterology;;   CORONARY STENT INTERVENTION N/A 05/21/2021   Procedure: CORONARY STENT INTERVENTION;  Surgeon: Marykay Lex, MD;  Location: [facility] INVASIVE CV LAB;  Service: Cardiovascular;  Laterality: N/A;   ESOPHAGOGASTRODUODENOSCOPY (EGD) WITH PROPOFOL N/A 10/14/2022   Procedure: ESOPHAGOGASTRODUODENOSCOPY (EGD) WITH PROPOFOL;  Surgeon: Benancio Deeds, MD;  Location: [facility] ENDOSCOPY;  Service: Gastroenterology;  Laterality: N/A;   HEMOSTASIS CLIP PLACEMENT  10/14/2022   Procedure: HEMOSTASIS CLIP PLACEMENT;  Surgeon: Benancio Deeds, MD;  Location: [facility] ENDOSCOPY;  Service: Gastroenterology;;   INCISION AND DRAINAGE ABSCESS N/A 10/24/2020   Procedure: exicision of hydradenitis;  Surgeon: Romie Levee, MD;  Location: [facility];  Service: General;  Laterality: N/A;   45 min   INSERTION OF DIALYSIS CATHETER Right 04/14/2019   Procedure: INSERTION OF DIALYSIS CATHETER;  Surgeon: Larina Earthly, MD;  Location: [facility] OR;  Service: Vascular;  Laterality: Right;   IR FLUORO GUIDE CV LINE RIGHT  10/13/2022   IR REMOVAL TUN CV CATH W/O FL  10/16/2022   IR US GUIDE VASC ACCESS RIGHT  10/13/2022   LEFT HEART CATH AND CORONARY ANGIOGRAPHY N/A 05/21/2021   Procedure: LEFT HEART CATH AND CORONARY ANGIOGRAPHY;  Surgeon: Marykay Lex, MD;  Location: [facility] INVASIVE CV LAB;  Service: Cardiovascular;  Laterality: N/A;   LOWER EXTREMITY ANGIOGRAPHY N/A 07/05/2018   Procedure: LOWER EXTREMITY ANGIOGRAPHY;  Surgeon: Nada Libman, MD;  [facility] INVASIVE CV LAB;  Service: Cardiovascular;  Laterality: N/A;   PERIPHERAL VASCULAR ATHERECTOMY Right 12/13/2020   Procedure: PERIPHERAL VASCULAR ATHERECTOMY;  Surgeon: Chuck Hint, MD;  Location: [facility] INVASIVE CV LAB;  Service: Cardiovascular;  Laterality: Right;  Superficial femoral   PERIPHERAL VASCULAR BALLOON ANGIOPLASTY Left 07/05/2018   Procedure: PERIPHERAL VASCULAR BALLOON ANGIOPLASTY;  Surgeon:  Nada Libman, MD;  [facility]: [facility] INVASIVE CV LAB;  Service: Cardiovascular;  Laterality: Left;  SFA   PERIPHERAL VASCULAR BALLOON ANGIOPLASTY Right 12/13/2020   Procedure: PERIPHERAL VASCULAR BALLOON ANGIOPLASTY;  Surgeon: Chuck Hint, MD;  Location: [facility] INVASIVE CV LAB;  Service: Cardiovascular;  Laterality: Right;  Peroneal artery, anterior tibial artery   RECTAL EXAM UNDER ANESTHESIA N/A 10/24/2020   Procedure: EXAM UNDER ANESTHESIA;  Surgeon: Romie Levee, MD;  Location: [facility];  Service: General;  Laterality: N/A;   STUMP REVISION Left 10/19/2018   Procedure: REVISION LEFT BELOW KNEE AMPUTATION;  Surgeon: Nadara Mustard, MD;  Location: [facility] OR;  Service: Orthopedics;  Laterality: Left;   TUBAL LIGATION  2002    No Known Allergies  Prior to Admission medications   Medication Sig Start Date End Date  Taking? Authorizing Provider  albuterol (VENTOLIN HFA) 108 (90 Base) MCG/ACT inhaler Inhale 2 puffs into the lungs every 4 (four) hours as needed for wheezing or shortness of breath. 02/14/21  Yes Angiulli, Mcarthur Rossetti, PA-C  amLODipine (NORVASC) 10 MG tablet Take 1 tablet (10 mg total) by mouth daily. 10/17/22  Yes Rocky Morel, DO  atorvastatin (LIPITOR) 80 MG tablet Take 1 tablet (80 mg total) by mouth daily. 11/03/22  Yes Adron Bene, MD  bictegravir-emtricitabine-tenofovir AF (BIKTARVY) 50-200-25 MG TABS tablet Take 1 tablet by mouth daily. OVERDUE FOR OFFICE FOLLOW UP - PLEASE CALL OUR OFFICE TO SCHEDULE [phone] Patient taking differently: Take 1 tablet by mouth daily. 08/26/22  Yes Cliffton Asters, MD  calcitRIOL (ROCALTROL) 0.25 MCG capsule Take 5 capsules (1.25 mcg total) by mouth Every Tuesday,Thursday,and Saturday with dialysis. 07/02/22  Yes Marolyn Haller, MD  calcium acetate (PHOSLO) 667 MG capsule Take 2 capsules (1,334 mg total) by mouth 3 (three) times daily with meals. 06/30/22  Yes Marolyn Haller, MD  cyclobenzaprine (FLEXERIL) 5 MG tablet Take 5 mg by mouth daily as needed for muscle spasms. 10/17/22  Yes [provider]  diclofenac Sodium (VOLTAREN) 1 % GEL Apply 4 g topically 4 (four) times daily. Patient taking differently: Apply 4 g topically daily as needed (for pain). 10/02/22  Yes Adron Bene, MD  EQ ASPIRIN ADULT LOW DOSE 81 MG tablet TAKE 1 TABLET BY MOUTH ONCE DAILY. SWALLOW WHOLE. Patient taking differently: Take 81 mg by mouth daily. 02/06/22  Yes Maeola Harman, MD  escitalopram (LEXAPRO) 5 MG tablet Take 1 tablet (5 mg total) by mouth daily. 11/03/22  Yes Adron Bene, MD  gabapentin (NEURONTIN) 100 MG capsule Take 100 mg by mouth at bedtime. 09/10/22  Yes [provider]  hydrocortisone-pramoxine (PROCTOFOAM-HC) rectal foam Place 1 applicator rectally 2 (two) times daily. 11/07/22  Yes Dione Booze, MD  insulin glargine (LANTUS  SOLOSTAR) 100 UNIT/ML Solostar Pen Inject 15 Units into the skin at bedtime. 11/03/22  Yes Adron Bene, MD  lidocaine (LIDODERM) 5 % Place 1 patch onto the skin daily. Remove & Discard patch within 12 hours or as directed by MD Patient taking differently: Place 1 patch onto the skin daily as needed (For pain). 10/03/22  Yes Adron Bene, MD  liraglutide (VICTOZA) 18 MG/3ML SOPN Inject 0.6 mg into the skin daily. 11/03/22 10/29/23 Yes Adron Bene, MD  LOKELMA 5 g packet Take 5 g by mouth daily. 07/30/22  Yes [provider]  multivitamin (RENA-VIT) TABS tablet Take 1 tablet by mouth daily. Patient taking differently: Take 1 tablet by mouth See admin instructions. On Tuesday, Wednesday and Thursday 02/14/21  Yes  Angiulli, Mcarthur Rossetti, PA-C  pantoprazole (PROTONIX) 40 MG tablet Take 1 tablet (40 mg total) by mouth 2 (two) times daily for 28 days, THEN 1 tablet (40 mg total) daily. 10/16/22 02/11/23 Yes Rocky Morel, DO  Ubrogepant (UBRELVY) 50 MG TABS Take 1 tablet (50 mg total) by mouth as needed (migraine headache). if needed a second dose may be taken at least 2 hours after the initial dose; MAX 8 doses in a month 11/24/22  Yes Quincy Simmonds, MD  Accu-Chek Softclix Lancets lancets Use as instructed 05/02/21   Chauncey Mann, DO  Continuous Blood Gluc Receiver (DEXCOM G7 RECEIVER) DEVI Use to check blood sugars cntinuously 06/30/22   Marolyn Haller, MD  Continuous Blood Gluc Sensor (DEXCOM G7 SENSOR) MISC Use to check blood sugar continuously 06/30/22   Marolyn Haller, MD  Darbepoetin Alfa (ARANESP) 200 MCG/0.4ML SOSY injection Inject 0.4 mLs (200 mcg total) into the skin every Monday at 6 PM. Patient taking differently: Inject 200 mcg into the skin every 7 (seven) days. Every Monday 07/06/22   Marolyn Haller, MD  glucose blood test strip Use as instructed 05/02/21   Atway, Rayann N, DO  Insulin Pen Needle (PENTIPS) 32G X 4 MM MISC Use as directed 11/10/22   Quincy Simmonds, MD   polyethylene glycol (MIRALAX / GLYCOLAX) 17 g packet Take 17 g by mouth daily. Patient not taking: Reported on 10/16/2022 07/01/22   Marolyn Haller, MD  topiramate (TOPAMAX) 25 MG tablet Take 1 tablet (25 mg total) by mouth daily for 7 days, THEN 1 tablet (25 mg total) 2 (two) times daily for 7 days. 10/16/22 10/30/22  Rocky Morel, DO    Social History   Socioeconomic History   Marital status: Single    Spouse name: Not on file   Number of children: Not on file   Years of education: 10   Highest education level: Not on file  Occupational History    Employer: UNEMPLOYED  Tobacco Use   Smoking status: Former    Packs/day: 0.10    Years: 10.00    Additional pack years: 0.00    Total pack years: 1.00    Types: Cigarettes    Start date: 11/17/2013    Quit date: 09/09/2014    Years since quitting: 8.2   Smokeless tobacco: Never  Vaping Use   Vaping Use: Never used  Substance and Sexual Activity   Alcohol use: Not Currently   Drug use: Yes    Types: Marijuana    Comment: 2 times a monthy -  `last time [place] 2022   Sexual activity: Not Currently    Partners: Female, Female  Other Topics Concern   Not on file  Social History Narrative   Not on file   Social Determinants of Health   Financial Resource Strain: Low Risk  (10/19/2022)   Overall Financial Resource Strain (CARDIA)    Difficulty of Paying Living Expenses: Not hard at all  Food Insecurity: No Food Insecurity (12/23/2022)   Hunger Vital Sign    Worried About Running Out of Food in the Last Year: Never true    Ran Out of Food in the Last Year: Never true  Transportation Needs: No Transportation Needs (12/23/2022)   PRAPARE - Administrator, Civil Service (Medical): No    Lack of Transportation (Non-Medical): No  Recent Concern: Transportation Needs - Unmet Transportation Needs (11/11/2022)   PRAPARE - Transportation    Lack of Transportation (Medical): Yes    Lack of  Transportation (Non-Medical): No   Physical Activity: Inactive (10/19/2022)   Exercise Vital Sign    Days of Exercise per Week: 0 days    Minutes of Exercise per Session: 0 min  Stress: Stress Concern Present (10/19/2022)   [employer] of Occupational Health - Occupational Stress Questionnaire    Feeling of Stress : Very much  Social Connections: Socially Isolated (10/19/2022)   Social Connection and Isolation Panel [NHANES]    Frequency of Communication with Friends and Family: More than three times a week    Frequency of Social Gatherings with Friends and Family: Never    Attends Religious Services: Never    Database administrator or Organizations: No    Attends Banker Meetings: Never    Marital Status: Never married  Intimate Partner Violence: Not At Risk (12/23/2022)   Humiliation, Afraid, Rape, and Kick questionnaire    Fear of Current or Ex-Partner: No    Emotionally Abused: No    Physically Abused: No    Sexually Abused: No     Family History  Problem Relation Age of Onset   Diabetes Mother    Diabetes Brother    Diabetes Daughter    Diabetes Daughter    Mental retardation Brother        died from PNA   Diabetes Maternal Grandmother     ROS: Otherwise negative unless mentioned in HPI  Physical Examination  Vitals:   12/24/22 0428 12/24/22 0755  BP: (!) 162/72 (!) 178/72  Pulse: 80 73  Resp: 18 18  Temp: 98 F (36.7 C) 98.5 F (36.9 C)  SpO2: 100% 99%   There is no height or weight on file to calculate BMI.  General:  WDWN in NAD Gait: Not observed HENT: WNL, normocephalic Pulmonary: normal non-labored breathing Cardiac: regular Abdomen:  soft, NT/ND, no masses Skin: without rashes Vascular Exam/Pulses: palpable radial pulses bilaterally Extremities: 4 incisions of left medial bicep without overt signs of infection. Incision in upper left arm with mild dehiscence, no bleeding or active drainage.  Sutures in place on arm incisions.  Some swollen areas of left medial  bicep.  Tunneled left upper arm AV graft along outer bicep without swelling, erythema, or tenderness to palpation.  AV graft with palpable thrill along the upper arm Musculoskeletal: no muscle wasting or atrophy  Neurologic: A&O X 3;  No focal weakness or paresthesias are detected; speech is fluent/normal Psychiatric:  The pt has Normal affect. Lymph:  Unremarkable    CBC    Component Value Date/Time   WBC 4.1 12/24/2022 0732   RBC 2.33 (L) 12/24/2022 0732   HGB 6.8 (LL) 12/24/2022 0732   HGB 9.2 (L) 10/19/2022 1204   HCT 22.2 (L) 12/24/2022 0732   HCT 28.6 (L) 10/19/2022 1204   PLT 234 12/24/2022 0732   PLT 343 10/19/2022 1204   MCV 95.3 12/24/2022 0732   MCV 91 10/19/2022 1204   MCH 29.2 12/24/2022 0732   MCHC 30.6 12/24/2022 0732   RDW 15.9 (H) 12/24/2022 0732   RDW 16.0 (H) 10/19/2022 1204   LYMPHSABS 2.5 10/12/2022 1720   LYMPHSABS 1.6 12/23/2017 1558   MONOABS 0.8 10/12/2022 1720   EOSABS 0.2 10/12/2022 1720   EOSABS 0.2 12/23/2017 1558   BASOSABS 0.1 10/12/2022 1720   BASOSABS 0.1 12/23/2017 1558    BMET    Component Value Date/Time   NA 136 12/24/2022 0732   NA 138 12/23/2017 1558   K 4.5 12/24/2022 0732   CL  98 12/24/2022 0732   CO2 19 (L) 12/24/2022 0732   GLUCOSE 78 12/24/2022 0732   BUN 36 (H) 12/24/2022 0732   BUN 28 (H) 12/23/2017 1558   CREATININE 7.23 (H) 12/24/2022 0732   CREATININE 12.07 (H) 05/29/2020 1155   CALCIUM 8.4 (L) 12/24/2022 0732   CALCIUM 8.5 (L) 11/25/2018 0309   GFRNONAA 7 (L) 12/24/2022 0732   GFRNONAA 79 11/14/2014 1154   GFRAA 5 (L) 01/06/2020 1329   GFRAA >89 11/14/2014 1154    COAGS: Lab Results  Component Value Date   INR 1.1 12/23/2022   INR 1.2 11/06/2022   INR 1.3 (H) 10/12/2022     ASSESSMENT/PLAN: This is a 47 y.o. female with concerns for infected left arm AVG   -The patient has a history of left basilic arm fistula creation in 2020.  Over time she has developed some aneurysmal areas of this fistula.  While  traveling in [place] last month she had some prolonged bleeding issues from her fistula.  She ultimately required resection of multiple fistula aneurysmal areas and placement of a new left upper arm AV graft.  A TDC was also placed at that time -She has 4 incisions along her medial bicep.  3 of the 4 incisions are nearly healed with no signs of infection.  One of her incisions opened up a few days ago with some reported serosanguineous drainage.  There are no signs of infection on exam.  The patient denies any issues with puslike drainage, erythema, or fevers. -On exam the patient's left arm AV graft has a palpable thrill.  There is no swelling or erythema surrounding the graft.  The patient has no pain along palpation of the graft.  -At this time I do not think that the patient's incisions or left arm graft is infected.  I believe her incisional dehiscence should heal fine with local wound care.  She could potentially benefit from a small course of prophylactic antibiotics during dialysis.  I do not think her AV graft needs to be removed at this time -Potentially her sutures could come out within the next week.  For the time being she can continue to use her [facility] for dialysis   Loel Dubonnet PA-C [facility] [phone]

## 2022-12-24 NOTE — Progress Notes (Signed)
   12/24/22 1915  Pain Assessment  Pain Scale 0-10  Pain Score 0  Hemodialysis Catheter  Placement Date/Time: 12/18/22 1530   Placed prior to admission: Yes  Time Out: Correct patient;Correct site  Site Condition No complications  Blue Lumen Status Flushed;Dead end cap in place;Heparin locked  Red Lumen Status Flushed;Dead end cap in place;Heparin locked  Catheter fill solution Heparin 1000 units/ml  Catheter fill volume (Arterial) 1.8 cc  Catheter fill volume (Venous) 1.8  Dressing Type Transparent  Dressing Status Antimicrobial disc in place;Clean, Dry, Intact  Drainage Description None  Dressing Change Due 12/30/22  Post treatment catheter status Capped and Clamped  Neurological  Level of Consciousness Alert  Orientation Level Oriented X4  RUE Motor Strength 4  LUE Motor Strength 4  Respiratory  Respiratory Pattern Regular;Unlabored  Chest Assessment Chest expansion symmetrical  Bilateral Breath Sounds Other (Comment) (Coarse)  Cardiac  Pulse Regular  Heart Sounds S1, S2  Jugular Venous Distention (JVD) No  ECG Monitor Yes  Cardiac Rhythm NSR  Vascular  R Radial Pulse +2  L Radial Pulse +2  GU Assessment  Genitourinary (WDL) X  Genitourinary Symptoms Oliguria  Psychosocial  Psychosocial (WDL) WDL   Received patient in bed to unit. - yes  Alert and oriented. - yes x 4 Informed consent signed and in chart. - yes  TX duration: 3.30  Patient tolerated well. - yes Transported back to the room - yes 2W27 Alert, without acute distress. - Yes Hand-off given to patient's nurse. Rayfield Citizen Njoroge @ [phone]  Access used: Rt chest Perm Cath Access issues: None  Total UF removed: 3.7 net 3.0 Medication(s) given: Dilaudid Post HD VS: B/P 164/84 HR 73 Temp 98.2 Post HD weight: 77.8kg   Kathy Frank [facility]

## 2022-12-24 NOTE — Care Management CC44 (Cosign Needed)
Condition Code 44 Documentation Completed  Patient Details  Name: Kathy Frank MRN: [id number] Date of Birth: 01/10/1976   Condition Code 44 given:  Yes Patient signature on Condition Code 44 notice:  Yes Documentation of 2 MD's agreement:  Yes Code 44 added to claim:  Yes    Janae Bridgeman, RN 12/24/2022, 11:04 AM

## 2022-12-24 NOTE — Progress Notes (Signed)
                 Interval history Has a headache, typical of her migraines. Crampy abdominal pain is stable since yesterday, as is pain around recently revised AV fistula.  Reports some streaky blood with stool.  Physical exam Blood pressure (!) 162/72, pulse 80, temperature 98 F (36.7 C), resp. rate 18, SpO2 100 %.  No apparent distress Heart rate is normal, rhythm is regular, visible thrill of the left AV fistula Breathing is regular and unlabored on room air, bilateral lung sounds clear Abdomen is diffusely tender to palpation, otherwise soft and nondistended Skin is warm and dry, small area of dehiscence over one of the AV fistula incision sites Alert and oriented  Labs, images, and other studies Sodium 136 Potassium 4.5 Bicarb 19 WBC 4.1 Hemoglobin 6.8  Assessment and plan Hospital day 1  Kathy Frank is a 47 y.o. with ESRD and history of gastric ulcer who is admitted for acute on chronic anemia.  Principal Problem:   Acute blood loss anemia Active Problems:   Human immunodeficiency virus (HIV) disease (HCC)   ESRD on dialysis (HCC)   Abdominal pain   A-V fistula (HCC)  Acute on chronic anemia Hemoglobin stable overnight without transfusion.  Not suspicious for significant ongoing blood loss.  Reports some hematochezia but no frank melena or large-volume rectal bleeding.  Transfusion with dialysis today along with EPO.  Will ask GI to evaluate and determine whether endoscopic workup should be done inpatient or deferred. - PRBCs with dialysis, 1 unit - Serial hemoglobin - Pantoprazole 40 mg p.o. daily  Abdominal pain Continues to have crampy and predominantly lower abdominal pain.  Wonder about distal colitis, could be contributing to her subacute to chronic anemia.  Again, appreciate GI assistance.  ESRD on dialysis Appreciate nephrology assistance with this case.  Left AV fistula status post recent revision Low index of suspicion for acute fistula  infection.  Vascular surgery has seen and evaluated, appreciate their insight.  Continue using tunneled HD catheter for now.  HIV - Continue Biktarvy  Hypertension - Amlodipine 10 mg  Coronary artery disease - Atorvastatin 80 mg  Type 2 diabetes - Glargine 15 units daily  Diet: Carb modified IVF: None VTE: Held in setting of subacute bleeding Code: Full TOC recommendations: Code 44  Marrianne Mood MD 12/24/2022, 7:24 AM  Pager: [phone] After 5pm or weekend: [phone]

## 2022-12-24 NOTE — Consult Note (Addendum)
Renal Service Consult Note [facility]  Kathy Frank 12/24/2022 Kathy Krabbe, MD Requesting Physician: Dr. Mikey Bussing  Reason for Consult: ESRD pt w/  HPI: The patient is a 47 y.o. year-old w/ PMH as below who presented to ED for low Hb on outpt labs. Also having LUA pain after AVF revision 2 wks ago. In ED VSS, Hb 6.5, trop 41. Pt was admitted. We are asked to see for HD.   Pt seen in room. Current main issue is pain and drainage from her L upper arm AVF revision wound. The surgery was about 2 wks ago and pain / drainage started 2-3 days ago. Using [facility] for HD now. She says her arm has been rubbing on her wheelchair, she uses a manual WC to get around. She is a IT consultant.    ROS - denies CP, no joint pain, no HA, no blurry vision, no rash, no diarrhea, no nausea/ vomiting, no dysuria, no difficulty voiding   Past Medical History  Past Medical History:  Diagnosis Date   Anal abscess    chronic   Anxiety    CAD (coronary artery disease)    CVA (cerebral vascular accident) (HCC)    Depression 06/28/2006   Qualifier: Diagnosis of  By: Laveda Abbe MD, Todd     Diabetes mellitus type 2 in obese 06/28/1994   Dyspnea    uses oxygeb 2 liters per minute at dialysis   End stage renal disease on dialysis [facility])    on hemodialysis T/Th/Sat   Erosive esophagitis    Esophageal reflux    Eye redness    Gastroparesis    ? diabetic   GERD (gastroesophageal reflux disease)    History of blood transfusion 2019   Human immunodeficiency virus (HIV) disease (HCC) 04/23/2016   Hyperlipidemia    Hypertension    Metabolic bone disease 05/02/2019   Moderate nonproliferative diabetic retinopathy of both eyes (HCC) 11/21/2014   11/14/14: Noted on retinal imaging; needs follow-up imaging in 6 months  05/22/16: Noted on retinal imaging again; needs follow-up imaging in 6 months   PAD (peripheral artery disease) (HCC)    Type 2 diabetes mellitus with diabetic peripheral angiopathy  without gangrene (HCC) 05/01/2019   Wears dentures    lower   Wound infection s/p L transmetatarsal amputation    Past Surgical History  Past Surgical History:  Procedure Laterality Date   ABDOMINAL AORTOGRAM W/LOWER EXTREMITY N/A 12/13/2020   Procedure: ABDOMINAL AORTOGRAM W/LOWER EXTREMITY;  Surgeon: Chuck Hint, MD;  Location: [facility] INVASIVE CV LAB;  Service: Cardiovascular;  Laterality: N/A;   AMPUTATION Left 07/08/2018   Procedure: AMPUTATION FORTH RAY LEFT FOOT;  Surgeon: Nadara Mustard, MD;  Location: [facility] OR;  Service: Orthopedics;  Laterality: Left;   AMPUTATION Left 08/09/2018   Procedure: Left Transmetatarsal Amputation;  Surgeon: Nadara Mustard, MD;  Location: [facility] OR;  Service: Orthopedics;  Laterality: Left;   AMPUTATION Left 10/08/2018   Procedure: LEFT BELOW KNEE AMPUTATION;  Surgeon: Nadara Mustard, MD;  Location: [facility] OR;  Service: Orthopedics;  Laterality: Left;   AMPUTATION Left 10/28/2018   Procedure: REVISION BELOW KNEE AMPUTATION;  Surgeon: Nadara Mustard, MD;  Location: [facility] OR;  Service: Orthopedics;  Laterality: Left;   AMPUTATION Left 12/16/2018   Procedure: LEFT ABOVE KNEE AMPUTATION;  Surgeon: Nadara Mustard, MD;  Location: [facility] OR;  Service: Orthopedics;  Laterality: Left;   AMPUTATION Right 01/31/2021   Procedure: AMPUTATION BELOW KNEE;  Surgeon: Nada Libman,  MD;  Location: [facility] OR;  Service: Vascular;  Laterality: Right;   AMPUTATION Right 03/28/2021   Procedure: REVISION OF RIGHT BELOW KNEE AMPUTATION;  Surgeon: Nada Libman, MD;  Location: [facility] OR;  Service: Vascular;  Laterality: Right;   AMPUTATION Right 04/18/2021   Procedure: right below knee amputation/washout placement wound vac;  Surgeon: Leonie Douglas, MD;  Location: [facility] OR;  Service: Vascular;  Laterality: Right;   AV FISTULA PLACEMENT Left 10/14/2018   Procedure: Arteriovenous (Av) Fistula Creation Left Arm;  Surgeon: Cephus Shelling, MD;  Location: [facility] OR;  Service: Vascular;  Laterality: Left;    BASCILIC VEIN TRANSPOSITION Left 04/14/2019   Procedure: BASILIC VEIN TRANSPOSITION SECOND STAGE LEFT ARM;  Surgeon: Larina Earthly, MD;  Location: [facility] OR;  Service: Vascular;  Laterality: Left;   BIOPSY  10/14/2022   Procedure: BIOPSY;  Surgeon: Benancio Deeds, MD;  Location: [facility] ENDOSCOPY;  Service: Gastroenterology;;   CORONARY STENT INTERVENTION N/A 05/21/2021   Procedure: CORONARY STENT INTERVENTION;  Surgeon: Marykay Lex, MD;  Location: [facility] INVASIVE CV LAB;  Service: Cardiovascular;  Laterality: N/A;   ESOPHAGOGASTRODUODENOSCOPY (EGD) WITH PROPOFOL N/A 10/14/2022   Procedure: ESOPHAGOGASTRODUODENOSCOPY (EGD) WITH PROPOFOL;  Surgeon: Benancio Deeds, MD;  Location: [facility] ENDOSCOPY;  Service: Gastroenterology;  Laterality: N/A;   HEMOSTASIS CLIP PLACEMENT  10/14/2022   Procedure: HEMOSTASIS CLIP PLACEMENT;  Surgeon: Benancio Deeds, MD;  Location: [facility] ENDOSCOPY;  Service: Gastroenterology;;   INCISION AND DRAINAGE ABSCESS N/A 10/24/2020   Procedure: exicision of hydradenitis;  Surgeon: Romie Levee, MD;  Location: [facility];  Service: General;  Laterality: N/A;  45 min   INSERTION OF DIALYSIS CATHETER Right 04/14/2019   Procedure: INSERTION OF DIALYSIS CATHETER;  Surgeon: Larina Earthly, MD;  Location: [facility] OR;  Service: Vascular;  Laterality: Right;   IR FLUORO GUIDE CV LINE RIGHT  10/13/2022   IR REMOVAL TUN CV CATH W/O FL  10/16/2022   IR US GUIDE VASC ACCESS RIGHT  10/13/2022   LEFT HEART CATH AND CORONARY ANGIOGRAPHY N/A 05/21/2021   Procedure: LEFT HEART CATH AND CORONARY ANGIOGRAPHY;  Surgeon: Marykay Lex, MD;  Location: [facility] INVASIVE CV LAB;  Service: Cardiovascular;  Laterality: N/A;   LOWER EXTREMITY ANGIOGRAPHY N/A 07/05/2018   Procedure: LOWER EXTREMITY ANGIOGRAPHY;  Surgeon: Nada Libman, MD;  [facility]: [facility] INVASIVE CV LAB;  Service: Cardiovascular;  Laterality: N/A;   PERIPHERAL VASCULAR ATHERECTOMY Right 12/13/2020   Procedure: PERIPHERAL VASCULAR ATHERECTOMY;   Surgeon: Chuck Hint, MD;  Location: [facility] INVASIVE CV LAB;  Service: Cardiovascular;  Laterality: Right;  Superficial femoral   PERIPHERAL VASCULAR BALLOON ANGIOPLASTY Left 07/05/2018   Procedure: PERIPHERAL VASCULAR BALLOON ANGIOPLASTY;  Surgeon: Nada Libman, MD;  [facility] INVASIVE CV LAB;  Service: Cardiovascular;  Laterality: Left;  SFA   PERIPHERAL VASCULAR BALLOON ANGIOPLASTY Right 12/13/2020   Procedure: PERIPHERAL VASCULAR BALLOON ANGIOPLASTY;  Surgeon: Chuck Hint, MD;  Location: [facility] INVASIVE CV LAB;  Service: Cardiovascular;  Laterality: Right;  Peroneal artery, anterior tibial artery   RECTAL EXAM UNDER ANESTHESIA N/A 10/24/2020   Procedure: EXAM UNDER ANESTHESIA;  Surgeon: Romie Levee, MD;  Location: [facility];  Service: General;  Laterality: N/A;   STUMP REVISION Left 10/19/2018   Procedure: REVISION LEFT BELOW KNEE AMPUTATION;  Surgeon: Nadara Mustard, MD;  Location: [facility] OR;  Service: Orthopedics;  Laterality: Left;   TUBAL LIGATION  2002   Family History  Family History  Problem Relation Age of  Onset   Diabetes Mother    Diabetes Brother    Diabetes Daughter    Diabetes Daughter    Mental retardation Brother        died from PNA   Diabetes Maternal Grandmother    Social History  reports that she quit smoking about 8 years ago. Her smoking use included cigarettes. She started smoking about 9 years ago. She has a 1.00 pack-year smoking history. She has never used smokeless tobacco. She reports that she does not currently use alcohol. She reports current drug use. Drug: Marijuana. Allergies No Known Allergies Home medications Prior to Admission medications   Medication Sig Start Date End Date Taking? Authorizing Provider  albuterol (VENTOLIN HFA) 108 (90 Base) MCG/ACT inhaler Inhale 2 puffs into the lungs every 4 (four) hours as needed for wheezing or shortness of breath. 02/14/21  Yes Angiulli, Mcarthur Rossetti, PA-C  amLODipine (NORVASC) 10  MG tablet Take 1 tablet (10 mg total) by mouth daily. 10/17/22  Yes Rocky Morel, DO  atorvastatin (LIPITOR) 80 MG tablet Take 1 tablet (80 mg total) by mouth daily. 11/03/22  Yes Adron Bene, MD  bictegravir-emtricitabine-tenofovir AF (BIKTARVY) 50-200-25 MG TABS tablet Take 1 tablet by mouth daily. OVERDUE FOR OFFICE FOLLOW UP - PLEASE CALL OUR OFFICE TO SCHEDULE [phone] Patient taking differently: Take 1 tablet by mouth daily. 08/26/22  Yes Cliffton Asters, MD  calcitRIOL (ROCALTROL) 0.25 MCG capsule Take 5 capsules (1.25 mcg total) by mouth Every Tuesday,Thursday,and Saturday with dialysis. 07/02/22  Yes Marolyn Haller, MD  calcium acetate (PHOSLO) 667 MG capsule Take 2 capsules (1,334 mg total) by mouth 3 (three) times daily with meals. 06/30/22  Yes Marolyn Haller, MD  cyclobenzaprine (FLEXERIL) 5 MG tablet Take 5 mg by mouth daily as needed for muscle spasms. 10/17/22  Yes [provider]  diclofenac Sodium (VOLTAREN) 1 % GEL Apply 4 g topically 4 (four) times daily. Patient taking differently: Apply 4 g topically daily as needed (for pain). 10/02/22  Yes Adron Bene, MD  EQ ASPIRIN ADULT LOW DOSE 81 MG tablet TAKE 1 TABLET BY MOUTH ONCE DAILY. SWALLOW WHOLE. Patient taking differently: Take 81 mg by mouth daily. 02/06/22  Yes Maeola Harman, MD  escitalopram (LEXAPRO) 5 MG tablet Take 1 tablet (5 mg total) by mouth daily. 11/03/22  Yes Adron Bene, MD  gabapentin (NEURONTIN) 100 MG capsule Take 100 mg by mouth at bedtime. 09/10/22  Yes [provider]  hydrocortisone-pramoxine (PROCTOFOAM-HC) rectal foam Place 1 applicator rectally 2 (two) times daily. 11/07/22  Yes Dione Booze, MD  insulin glargine (LANTUS SOLOSTAR) 100 UNIT/ML Solostar Pen Inject 15 Units into the skin at bedtime. 11/03/22  Yes Adron Bene, MD  lidocaine (LIDODERM) 5 % Place 1 patch onto the skin daily. Remove & Discard patch within 12 hours or as directed by MD Patient taking  differently: Place 1 patch onto the skin daily as needed (For pain). 10/03/22  Yes Adron Bene, MD  liraglutide (VICTOZA) 18 MG/3ML SOPN Inject 0.6 mg into the skin daily. 11/03/22 10/29/23 Yes Adron Bene, MD  LOKELMA 5 g packet Take 5 g by mouth daily. 07/30/22  Yes [provider]  multivitamin (RENA-VIT) TABS tablet Take 1 tablet by mouth daily. Patient taking differently: Take 1 tablet by mouth See admin instructions. On Tuesday, Wednesday and Thursday 02/14/21  Yes Angiulli, Mcarthur Rossetti, PA-C  pantoprazole (PROTONIX) 40 MG tablet Take 1 tablet (40 mg total) by mouth 2 (two) times daily for 28 days, THEN 1 tablet (  40 mg total) daily. 10/16/22 02/11/23 Yes Rocky Morel, DO  Ubrogepant (UBRELVY) 50 MG TABS Take 1 tablet (50 mg total) by mouth as needed (migraine headache). if needed a second dose may be taken at least 2 hours after the initial dose; MAX 8 doses in a month 11/24/22  Yes Quincy Simmonds, MD  Accu-Chek Softclix Lancets lancets Use as instructed 05/02/21   Chauncey Mann, DO  Continuous Blood Gluc Receiver (DEXCOM G7 RECEIVER) DEVI Use to check blood sugars cntinuously 06/30/22   Marolyn Haller, MD  Continuous Blood Gluc Sensor (DEXCOM G7 SENSOR) MISC Use to check blood sugar continuously 06/30/22   Marolyn Haller, MD  Darbepoetin Alfa (ARANESP) 200 MCG/0.4ML SOSY injection Inject 0.4 mLs (200 mcg total) into the skin every Monday at 6 PM. Patient taking differently: Inject 200 mcg into the skin every 7 (seven) days. Every Monday 07/06/22   Marolyn Haller, MD  glucose blood test strip Use as instructed 05/02/21   Atway, Rayann N, DO  Insulin Pen Needle (PENTIPS) 32G X 4 MM MISC Use as directed 11/10/22   Quincy Simmonds, MD  polyethylene glycol (MIRALAX / GLYCOLAX) 17 g packet Take 17 g by mouth daily. Patient not taking: Reported on 10/16/2022 07/01/22   Marolyn Haller, MD  topiramate (TOPAMAX) 25 MG tablet Take 1 tablet (25 mg total) by mouth daily for 7 days, THEN 1  tablet (25 mg total) 2 (two) times daily for 7 days. 10/16/22 10/30/22  Rocky Morel, DO     Vitals:   12/23/22 1754 12/23/22 1959 12/24/22 0428 12/24/22 0755  BP:  (!) 181/76 (!) 162/72 (!) 178/72  Pulse:  79 80 73  Resp:  17 18 18   Temp: 98.1 F (36.7 C) 99.6 F (37.6 C) 98 F (36.7 C) 98.5 F (36.9 C)  TempSrc: Oral     SpO2:  99% 100% 99%   Exam Gen alert, no distress No rash, cyanosis or gangrene Sclera anicteric, throat clear  No jvd or bruits Chest clear bilat to bases, no rales/ wheezing RRR no MRG Abd soft ntnd no mass or ascites +bs GU defer MS L AKA and R BKA Ext no LE or UE edema Neuro is alert, Ox 3 , nf    RIJ TDC intact/ LUA AVF+bruit, fresh wounds w/ some mild dehiscense mid upper arm      Home meds include - phoslo 2 ac, asa, lokelma, liraglutide, renavite, topamax, norvasc 10, lipitor, biktarvy qd, lexapro, gabapentin 100 hs, insulin glargine, protonix, prns/ vits/ supps     OP HD: [facility] TTS 4h  400 /1.5  79kg  2K/3Ca bath   TDC/ L AVF resting sp plication  Hep none - last HD 5/14, post wt 78.2kg  - venofer 100mg  IV tiw thru 6/13 - rocaltrol 1.2mcg po tiw - mircera 225 mcg IV q 2 wks, has not been started yet - Hb 4/4 was 9.4 and 5/14 was 6.5  CXR 5/15 - no acute disease  Assessment/ Plan: Anemia, symptomatic - Hb 6.4 on admission, sent for OP labs w/ Hb 5's. Primary team planning on transfusion w/ HD, have d/w pmd. Early April Hb was 9.4 (as above).  ESRD - on HD TTS. HD today on schedule.  LUA revision - had plication of PA's about 2 wks ago per pt. Now w/ 2-3 days of pain and drainage. Spoke w/ pmd, they will be consulting vascular team.  HTN/ volume - no edema on exam, clear lungs/ room air. UF 2-3 L as tolerated.  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 Due for esa, will start w/ darbe 200 mcg sq this evening then weekly.  MBD ckd - CCa and phos are in range. Cont po vdra and binders.  HIV  DM2      Vinson Moselle  MD  [facility] 12/24/2022, 11:31 AM  Recent Labs  Lab 12/23/22 1430 12/23/22 1623 12/24/22 0732  HGB 6.4*  --  6.8*  ALBUMIN  --  2.8* 2.9*  CALCIUM 9.1  --  8.4*  PHOS  --   --  5.9*  CREATININE 6.25*  --  7.23*  K 4.1  --  4.5   Inpatient medications:  acetaminophen  1,000 mg Oral Q6H   amLODipine  10 mg Oral Daily   atorvastatin  80 mg Oral Daily   bictegravir-emtricitabine-tenofovir AF  1 tablet Oral Daily   escitalopram  5 mg Oral Daily   gabapentin  100 mg Oral QHS   insulin aspart  0-9 Units Subcutaneous TID WC   insulin glargine-yfgn  15 Units Subcutaneous QHS   pantoprazole  40 mg Oral Daily   polyethylene glycol  17 g Oral Daily    albuterol, HYDROmorphone (DILAUDID) injection

## 2022-12-25 ENCOUNTER — Other Ambulatory Visit (HOSPITAL_COMMUNITY): Payer: Self-pay

## 2022-12-25 DIAGNOSIS — Z992 Dependence on renal dialysis: Secondary | ICD-10-CM | POA: Diagnosis not present

## 2022-12-25 DIAGNOSIS — I2510 Atherosclerotic heart disease of native coronary artery without angina pectoris: Secondary | ICD-10-CM | POA: Diagnosis not present

## 2022-12-25 DIAGNOSIS — I770 Arteriovenous fistula, acquired: Secondary | ICD-10-CM | POA: Diagnosis not present

## 2022-12-25 DIAGNOSIS — D649 Anemia, unspecified: Secondary | ICD-10-CM | POA: Diagnosis not present

## 2022-12-25 DIAGNOSIS — N186 End stage renal disease: Secondary | ICD-10-CM | POA: Diagnosis not present

## 2022-12-25 DIAGNOSIS — I120 Hypertensive chronic kidney disease with stage 5 chronic kidney disease or end stage renal disease: Secondary | ICD-10-CM | POA: Diagnosis not present

## 2022-12-25 DIAGNOSIS — N250 Renal osteodystrophy: Secondary | ICD-10-CM | POA: Diagnosis not present

## 2022-12-25 DIAGNOSIS — R1084 Generalized abdominal pain: Secondary | ICD-10-CM | POA: Diagnosis not present

## 2022-12-25 DIAGNOSIS — E1122 Type 2 diabetes mellitus with diabetic chronic kidney disease: Secondary | ICD-10-CM | POA: Diagnosis not present

## 2022-12-25 DIAGNOSIS — Z794 Long term (current) use of insulin: Secondary | ICD-10-CM | POA: Diagnosis not present

## 2022-12-25 LAB — TYPE AND SCREEN
Antibody Screen: NEGATIVE
Unit division: 0

## 2022-12-25 LAB — CBC
HCT: 28.6 % — ABNORMAL LOW (ref 36.0–46.0)
Hemoglobin: 9.1 g/dL — ABNORMAL LOW (ref 12.0–15.0)
MCH: 29.4 pg (ref 26.0–34.0)
MCHC: 31.8 g/dL (ref 30.0–36.0)
MCV: 92.3 fL (ref 80.0–100.0)
Platelets: 238 10*3/uL (ref 150–400)
RBC: 3.1 MIL/uL — ABNORMAL LOW (ref 3.87–5.11)
RDW: 16.1 % — ABNORMAL HIGH (ref 11.5–15.5)
WBC: 5.5 10*3/uL (ref 4.0–10.5)
nRBC: 0 % (ref 0.0–0.2)

## 2022-12-25 LAB — BPAM RBC
Blood Product Expiration Date: 202406122359
ISSUE DATE / TIME: 202405161622
Unit Type and Rh: 7300
Unit Type and Rh: 7300

## 2022-12-25 LAB — RENAL FUNCTION PANEL
Albumin: 2.9 g/dL — ABNORMAL LOW (ref 3.5–5.0)
Anion gap: 15 (ref 5–15)
BUN: 19 mg/dL (ref 6–20)
CO2: 24 mmol/L (ref 22–32)
Calcium: 8.3 mg/dL — ABNORMAL LOW (ref 8.9–10.3)
Chloride: 94 mmol/L — ABNORMAL LOW (ref 98–111)
Creatinine, Ser: 5.13 mg/dL — ABNORMAL HIGH (ref 0.44–1.00)
GFR, Estimated: 10 mL/min — ABNORMAL LOW (ref >60)
Glucose, Bld: 109 mg/dL — ABNORMAL HIGH (ref 70–99)
Phosphorus: 4 mg/dL (ref 2.5–4.6)
Potassium: 4.5 mmol/L (ref 3.5–5.1)
Sodium: 133 mmol/L — ABNORMAL LOW (ref 135–145)

## 2022-12-25 LAB — GLUCOSE, CAPILLARY
Glucose-Capillary: 70 mg/dL (ref 70–99)
Glucose-Capillary: 83 mg/dL (ref 70–99)
Glucose-Capillary: 94 mg/dL (ref 70–99)
Glucose-Capillary: 109 mg/dL — ABNORMAL HIGH (ref 70–99)
Glucose-Capillary: 113 mg/dL — ABNORMAL HIGH (ref 70–99)

## 2022-12-25 MED ORDER — DARBEPOETIN ALFA 200 MCG/0.4ML IJ SOSY: 200.0000 ug | PREFILLED_SYRINGE | INTRAMUSCULAR | Status: DC

## 2022-12-25 MED ORDER — BISACODYL 10 MG RE SUPP: 10.0000 mg | RECTAL | 0 refills | Status: DC | PRN

## 2022-12-25 MED ORDER — DARBEPOETIN ALFA 200 MCG/0.4ML IJ SOSY
200.0000 ug | PREFILLED_SYRINGE | INTRAMUSCULAR | Status: DC
  Administered 2022-12-25: 200 ug via SUBCUTANEOUS
  Filled 2022-12-25: qty 0.4

## 2022-12-25 MED ORDER — HYDROMORPHONE HCL 2 MG PO TABS: 1.0000 mg | ORAL_TABLET | Freq: Four times a day (QID) | ORAL | Status: DC | PRN

## 2022-12-25 MED ORDER — ACETAMINOPHEN 500 MG PO TABS: 1000.0000 mg | ORAL_TABLET | Freq: Four times a day (QID) | ORAL | 0 refills | Status: DC

## 2022-12-25 MED ORDER — PANTOPRAZOLE SODIUM 40 MG PO TBEC
40.0000 mg | DELAYED_RELEASE_TABLET | Freq: Two times a day (BID) | ORAL | 2 refills | Status: DC
  Filled 2022-12-25: qty 60, 30d supply, fill #0

## 2022-12-25 MED ORDER — HYDROMORPHONE HCL 2 MG PO TABS
1.0000 mg | ORAL_TABLET | Freq: Two times a day (BID) | ORAL | Status: DC | PRN
  Administered 2022-12-25: 1 mg via ORAL
  Filled 2022-12-25: qty 1

## 2022-12-25 MED ORDER — PANTOPRAZOLE SODIUM 40 MG PO TBEC
40.0000 mg | DELAYED_RELEASE_TABLET | Freq: Two times a day (BID) | ORAL | Status: DC
  Administered 2022-12-25: 40 mg via ORAL
  Filled 2022-12-25: qty 1

## 2022-12-25 MED ORDER — SENNOSIDES-DOCUSATE SODIUM 8.6-50 MG PO TABS: 1.0000 | ORAL_TABLET | Freq: Every day | ORAL | Status: DC

## 2022-12-25 MED ORDER — ONDANSETRON HCL 8 MG PO TABS
8.0000 mg | ORAL_TABLET | Freq: Three times a day (TID) | ORAL | 0 refills | Status: DC | PRN
  Filled 2022-12-25: qty 12, 4d supply, fill #0

## 2022-12-25 MED ORDER — SENNOSIDES-DOCUSATE SODIUM 8.6-50 MG PO TABS
1.0000 | ORAL_TABLET | Freq: Every day | ORAL | 2 refills | Status: DC
  Filled 2022-12-25: qty 30, 30d supply, fill #0

## 2022-12-25 MED ORDER — POLYETHYLENE GLYCOL 3350 17 G PO PACK: 17.0000 g | PACK | Freq: Two times a day (BID) | ORAL | 0 refills | Status: DC

## 2022-12-25 NOTE — Clinical Documentation Improvement (Signed)
[facility] Patient Discharge Orders- [facility] CLINIC: [address]   Patient's name: Kathy Frank Admit/DC Dates: 12/23/2022 - 12/25/2022  Discharge Diagnoses: Anemia = Multifactorial HGb 6.5  Ho Gastric Ulcer/ ESRD/ HO AVF surgery   GI eval in Hosp  fu as op    hgb 9.1  at dc sp transfusion   LUA  AVF  REvision / Conversion to AVGG  In [place]/ [facility] eval  Dr Lenell Antu =appears to be healing well. / fu in [facility] ov In 1 to 2 weeks   Aranesp: Given: yes   Date and amount of last dose: 12/25/22  200  Last Hgb: 9.1 PRBC's Given: yes Date/# of units: 12/24/22  2 ESA dose for discharge: mircera 200  mcg IV q 2 weeks  IV Iron dose at discharge: no  Heparin change: no hep  GI bld  EDW Change: no  New EDW:   Bath Change: no  Access intervention/Change: no Details:  Hectorol/Calcitriol change: no  Discharge Labs: Calcium8.3 Phosphorus 4.0 Albumin 2.9 K+ 4.5  IV Antibiotics: no Details:  On Coumadin?: no Last INR: Next INR: Managed By:   OTHER/APPTS/LAB ORDERS:    D/C Meds to be reconciled by nurse after every discharge.  Completed By: Lenny Pastel PA-C 12/25/2022, 5:01 PM  [facility] Pager: [phone]  Reviewed by: MD:______ RN_______

## 2022-12-25 NOTE — Progress Notes (Signed)
[facility] [place] PROGRESS NOTE  ASSESSMENT / PLAN: Kathy Frank is a 47 y.o. female with ESRD on HD TTS via TDC. Recent left arm AVF revision and conversion to AVG (done in [place]) appears to be healing well. No evidence of surgical site infection. Will plan to see her in 1-2 weeks in the office to remove sutures. Please call for questions.   SUBJECTIVE: Doing OK. Arm still tender medially. Dialysis going OK.  OBJECTIVE: BP (!) 186/81 (BP Location: Right Arm)   Pulse 73   Temp 98.4 F (36.9 C) (Axillary)   Resp 14   Wt 80.3 kg   SpO2 98%   BMI 29.46 kg/m   Intake/Output Summary (Last 24 hours) at 12/25/2022 0744 Last data filed at 12/24/2022 1915 Gross per 24 hour  Intake 841 ml  Output 3702 ml  Net -2861 ml    Chronically ill Left arm incisions appear OK. Suture in place. One area of superficial separation that has eschar. No drainage, warmth or erythema. Thrill in AVG     Latest Ref Rng & Units 12/24/2022    7:32 AM 12/23/2022    2:30 PM 11/07/2022    3:45 AM  CBC  WBC 4.0 - 10.5 K/uL 4.1  4.5    Hemoglobin 12.0 - 15.0 g/dL 6.8  6.4  9.8   Hematocrit 36.0 - 46.0 % 22.2  21.0  31.0   Platelets 150 - 400 K/uL 234  302          Latest Ref Rng & Units 12/24/2022    7:32 AM 12/23/2022    4:23 PM 12/23/2022    2:30 PM  CMP  Glucose 70 - 99 mg/dL 78   960   BUN 6 - 20 mg/dL 36   26   Creatinine 4.54 - 1.00 mg/dL 0.98   1.19   Sodium 147 - 145 mmol/L 136   140   Potassium 3.5 - 5.1 mmol/L 4.5   4.1   Chloride 98 - 111 mmol/L 98   96   CO2 22 - 32 mmol/L 19   25   Calcium 8.9 - 10.3 mg/dL 8.4   9.1   Total Protein 6.5 - 8.1 g/dL  8.3    Total Bilirubin 0.3 - 1.2 mg/dL  0.2    Alkaline Phos 38 - 126 U/L  49    AST 15 - 41 U/L  12    ALT 0 - 44 U/L  7      Estimated Creatinine Clearance: 10.2 mL/min (A) (by C-G formula based on SCr of 7.23 mg/dL (H)).  Rande Brunt. Lenell Antu, MD [facility] Phone  Number: [phone] 12/25/2022 7:44 AM

## 2022-12-25 NOTE — Hospital Course (Addendum)
Active Problems:   Human immunodeficiency virus (HIV) disease (HCC)   ESRD on dialysis (HCC)   Abdominal pain   A-V fistula (HCC)   End stage renal disease on dialysis (HCC)  Principal Problem (Resolved):   Acute blood loss anemia  Consults: - vvs - gi - nephrology  Procedures:***  Follow-up items: - follow-up GI Dr. Adela Lank - 1 week vvs of [place]

## 2022-12-25 NOTE — Discharge Instructions (Signed)
To [name] or their caretakers,  They were admitted to [facility] on 12/23/2022 for evaluation and treatment of:  Active Problems:   Human immunodeficiency virus (HIV) disease (HCC)   ESRD on dialysis (HCC)   Abdominal pain   A-V fistula (HCC)  Principal Problem (Resolved):   Acute blood loss anemia  The evaluation suggested acute on chronic anemia. They were treated with dialysis and blood transfusion x 2.  They were discharged from the hospital on 12/25/22. I recommend the following after leaving the hospital:   Follow-up with the gastroenterologist as scheduled.  Follow-up with the vascular surgeons within a week or 2 after the leaving the hospital for a checkup of the fistula and likely suture removal.  See your primary care doctor on Monday, May 20 at 9:45 AM.  Start taking pantoprazole twice a day to prevent worsening of ulcers.  Prevent constipation use MiraLAX twice a day.  Take senna at night.  Use bisacodyl suppositories as needed for severe constipation.  Marrianne Mood MD 12/25/2022, 10:49 AM

## 2022-12-25 NOTE — Progress Notes (Signed)
Subjective: Seen and examined in room alert, no complaints said tolerated dialysis on schedule yesterday, noted for discharge today.  Hgb 9.1  Objective Vital signs in last 24 hours: Vitals:   12/24/22 2000 12/25/22 0422 12/25/22 0500 12/25/22 0745  BP: (!) 171/72 (!) 186/81  (!) 173/74  Pulse: 79 73  75  Resp: 15 14  18   Temp: 97.8 F (36.6 C) 98.4 F (36.9 C)  98.6 F (37 C)  TempSrc: Oral Axillary    SpO2: 100% 98%  99%  Weight:   80.3 kg    Weight change:   Physical Exam: General: Alert pleasant chronically ill-appearing female, NAD Heart: RRR with no MRG Lungs: CTA bilaterally, nonlabored breathing room air Abdomen: Slightly obese, NABS, NTND no ascites Extremities: Left AKA and right BKA no edema Dialysis Access: Right IJ TDC dressing CDI, LUA AV fistula positive bruit with area of superficial wound with dry eschar no discharge warmth or erythema    OP HD: [facility] TTS 4h  400 /1.5  79kg  2K/3Ca bath   TDC/ L AVF resting sp plication  Hep none - last HD 5/14, post wt 78.2kg  - venofer 100mg  IV tiw thru 6/13 - rocaltrol 1.41mcg po tiw - mircera 225 mcg IV q 2 wks, has not been started yet - Hb 4/4 was 9.4 and 5/14 was 6.5   CXR 5/15 - no acute disease  Problem/Plan:  Anemia, symptomatic - Hb 6.4 on admission, sent for OP labs w/ Hb 5's.  Status post transfusion currently 9.1 this a.m.  GI eval noted and follow-up as outpatient ESRD - on HD TTS. HD yesterday on schedule.  LUA revision - had plication of PA's about 2 wks ago per pt. Now w/ 2-3 days of pain and drainage.  Previous sore yesterday and today today Dr. Juanetta Gosling plans to see her in office 1 week to remove sutures, noted no evidence of infection his exam HTN/ volume - no edema on exam, clear lungs/ room air. UF 2-3 L as tolerated.  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  Noted GI yesterday said will follow-up as an outpatient for need of EGD.  esa, will started  Aranesp 200 mcg sq given 5/17 and continue as outpatient.  MBD ckd - CCa and phos are in range. Cont po vdra and binders.  HIV  DM2  Lenny Pastel, PA-C [facility] [phone] 12/25/2022,12:17 PM  LOS: 1 day   Labs: Basic Metabolic Panel: Recent Labs  Lab 12/23/22 1430 12/24/22 0732 12/25/22 0905  NA 140 136 133*  K 4.1 4.5 4.5  CL 96* 98 94*  CO2 25 19* 24  GLUCOSE 175* 78 109*  BUN 26* 36* 19  CREATININE 6.25* 7.23* 5.13*  CALCIUM 9.1 8.4* 8.3*  PHOS  --  5.9* 4.0   Liver Function Tests: Recent Labs  Lab 12/23/22 1623 12/24/22 0732 12/25/22 0905  AST 12*  --   --   ALT 7  --   --   ALKPHOS 49  --   --   BILITOT 0.2*  --   --   PROT 8.3*  --   --   ALBUMIN 2.8* 2.9* 2.9*   Recent Labs  Lab 12/23/22 2005  LIPASE 36   No results for input(s): "AMMONIA" in the last 168 hours. CBC: Recent Labs  Lab 12/23/22 1430 12/24/22 0732 12/25/22 0905  WBC 4.5 4.1 5.5  HGB 6.4* 6.8* 9.1*  HCT 21.0* 22.2* 28.6*  MCV 94.6 95.3 92.3  PLT 302 234 238   Cardiac Enzymes: No results for input(s): "CKTOTAL", "CKMB", "CKMBINDEX", "TROPONINI" in the last 168 hours. CBG: Recent Labs  Lab 12/24/22 2201 12/24/22 2333 12/25/22 0347 12/25/22 0602 12/25/22 0807  GLUCAP 191* 155* 70 83 94    Studies/Results: DG Chest 1 View  Result Date: 12/23/2022 CLINICAL DATA:  Chest pain EXAM: CHEST  1 VIEW COMPARISON:  Chest x-ray 10/12/2022 FINDINGS: Right chest port catheter tip projects over the distal SVC. The heart is enlarged. The lungs and costophrenic angles are clear. There is no pneumothorax or acute fracture. IMPRESSION: Cardiomegaly with no acute pulmonary process. Electronically Signed   By: Darliss Cheney M.D.   On: 12/23/2022 15:12   Medications:   sodium chloride   Intravenous Once   acetaminophen  1,000 mg Oral Q6H   amLODipine  10 mg Oral Daily   atorvastatin  80 mg Oral Daily   bictegravir-emtricitabine-tenofovir AF  1 tablet Oral Daily    [START ON 12/26/2022] calcitRIOL  1 mcg Oral Q T,Th,Sa-HD   calcium acetate  1,334 mg Oral TID WC   Chlorhexidine Gluconate Cloth  6 each Topical Q0600   darbepoetin (ARANESP) injection - DIALYSIS  200 mcg Subcutaneous Weekly   escitalopram  5 mg Oral Daily   gabapentin  100 mg Oral QHS   insulin aspart  0-9 Units Subcutaneous TID WC   insulin glargine-yfgn  15 Units Subcutaneous QHS   pantoprazole  40 mg Oral BID   polyethylene glycol  17 g Oral BID   senna-docusate  1 tablet Oral QHS

## 2022-12-25 NOTE — Discharge Summary (Cosign Needed)
Name: Kathy Frank MRN: [id number] DOB: 1976/07/01 47 y.o. PCP: Quincy Simmonds, MD  Date of Admission: 12/23/2022  1:41 PM Date of Discharge: 12/25/2022 7:30 PM Attending Physician: No att. providers found  Discharge Diagnosis: Active Problems:   Human immunodeficiency virus (HIV) disease (HCC)   ESRD on dialysis (HCC)   Abdominal pain   A-V fistula (HCC)   End stage renal disease on dialysis (HCC)  Principal Problem (Resolved):   Acute blood loss anemia   Discharge Medications: Allergies as of 12/25/2022   No Known Allergies      Medication List     STOP taking these medications    cyclobenzaprine 5 MG tablet Commonly known as: FLEXERIL   topiramate 25 MG tablet Commonly known as: TOPAMAX       TAKE these medications    Accu-Chek Softclix Lancets lancets Use as instructed   acetaminophen 500 MG tablet Commonly known as: TYLENOL Take 2 tablets (1,000 mg total) by mouth every 6 (six) hours.   amLODipine 10 MG tablet Commonly known as: NORVASC Take 1 tablet (10 mg total) by mouth daily.   atorvastatin 80 MG tablet Commonly known as: LIPITOR Take 1 tablet (80 mg total) by mouth daily.   Biktarvy 50-200-25 MG Tabs tablet Generic drug: bictegravir-emtricitabine-tenofovir AF Take 1 tablet by mouth daily. OVERDUE FOR OFFICE FOLLOW UP - PLEASE CALL OUR OFFICE TO SCHEDULE [phone] What changed: additional instructions   bisacodyl 10 MG suppository Commonly known as: DULCOLAX Place 1 suppository (10 mg total) rectally as needed for moderate constipation.   calcitRIOL 0.25 MCG capsule Commonly known as: ROCALTROL Take 5 capsules (1.25 mcg total) by mouth Every Tuesday,Thursday,and Saturday with dialysis.   calcium acetate 667 MG capsule Commonly known as: PHOSLO Take 2 capsules (1,334 mg total) by mouth 3 (three) times daily with meals.   Darbepoetin Alfa 200 MCG/0.4ML Sosy injection Commonly known as: ARANESP Inject 0.4 mLs (200 mcg total) into  the skin every Monday at 6 PM. What changed:  when to take this additional instructions   Dexcom G7 Receiver Devi Use to check blood sugars cntinuously   Dexcom G7 Sensor Misc Use to check blood sugar continuously   diclofenac Sodium 1 % Gel Commonly known as: Voltaren Apply 4 g topically 4 (four) times daily. What changed:  when to take this reasons to take this   EQ Aspirin Adult Low Dose 81 MG tablet Generic drug: aspirin EC TAKE 1 TABLET BY MOUTH ONCE DAILY. SWALLOW WHOLE. What changed: See the new instructions.   escitalopram 5 MG tablet Commonly known as: Lexapro Take 1 tablet (5 mg total) by mouth daily.   gabapentin 100 MG capsule Commonly known as: NEURONTIN Take 100 mg by mouth at bedtime.   glucose blood test strip Use as instructed   hydrocortisone-pramoxine rectal foam Commonly known as: PROCTOFOAM-HC Place 1 applicator rectally 2 (two) times daily.   Lantus SoloStar 100 UNIT/ML Solostar Pen Generic drug: insulin glargine Inject 15 Units into the skin at bedtime.   lidocaine 5 % Commonly known as: LIDODERM Place 1 Frank onto the skin daily. Remove & Discard Frank within 12 hours or as directed by MD What changed:  when to take this reasons to take this additional instructions   liraglutide 18 MG/3ML Sopn Commonly known as: VICTOZA Inject 0.6 mg into the skin daily.   Lokelma 5 g packet Generic drug: sodium zirconium cyclosilicate Take 5 g by mouth daily.   multivitamin Tabs tablet Take 1 tablet by mouth  daily. What changed:  when to take this additional instructions   ondansetron 8 MG tablet Commonly known as: Zofran Take 1 tablet (8 mg total) by mouth every 8 (eight) hours as needed for nausea or vomiting.   pantoprazole 40 MG tablet Commonly known as: Protonix Take 1 tablet (40 mg total) by mouth 2 (two) times daily. What changed: See the new instructions.   Pentips 32G X 4 MM Misc Generic drug: Insulin Pen Needle Use as  directed   polyethylene glycol 17 g packet Commonly known as: MIRALAX / GLYCOLAX Take 17 g by mouth 2 (two) times daily. What changed: when to take this   ProAir HFA 108 (90 Base) MCG/ACT inhaler Generic drug: albuterol Inhale 2 puffs into the lungs every 4 (four) hours as needed for wheezing or shortness of breath.   Senexon-S 8.6-50 MG tablet Generic drug: senna-docusate Take 1 tablet by mouth at bedtime.   Ubrelvy 50 MG Tabs Generic drug: Ubrogepant Take 1 tablet (50 mg total) by mouth as needed (migraine headache). if needed a second dose may be taken at least 2 hours after the initial dose; MAX 8 doses in a month        Follow-up Appointments:  Follow-up Information     Quincy Simmonds, MD. Go on 12/28/2022.   Specialty: Internal Medicine Why: Arrive 15 minutes early for your appointment at 9:45 A.M. Contact information: [address] [place] [address] [phone]         Benancio Deeds, MD. Call.   Specialty: Gastroenterology Why: Call to confirm follow-up appointment. Contact information: [address] [place] [address] [phone]         Leonie Douglas, MD. Schedule an appointment as soon as possible for a visit.   Specialties: Vascular Surgery, Interventional Cardiology Contact information: [address] [place] [address] [phone]                 Disposition and follow-up: Kathy Frank is a 47 y.o. year old hospitalized for acute on chronic anemia.  Acute on chronic anemia Multifactorial thought to be due to recent episodes of severe prolonged epistaxis, ESRD, less likely acute GI bleeding.  Status post 2 units of PRBCs. - Repeat CBC - Pantoprazole twice daily - Ensure she has GI follow-up  Abdominal pain May be secondary to constipation.  Other diagnoses like proctitis, colitis, gastric ulcer were considered.  GI evaluated while hospitalized, primarily for above problem.  Discharged on  aggressive bowel regimen - Senna nightly - MiraLAX daily - Bisacodyl suppository as needed  Left upper extremity AV fistula revision to graft irritation Evaluated while hospitalized, low concern for infection. - Follow-up with [facility] Dr. Lenell Antu as outpatient  Hospital Course by problem list: Active Problems:   Human immunodeficiency virus (HIV) disease (HCC)   ESRD on dialysis (HCC)   Abdominal pain   A-V fistula (HCC)   End stage renal disease on dialysis (HCC)  Principal Problem (Resolved):   Acute blood loss anemia  Acute on chronic anemia Presented after notification that her hemoglobin was 5 after dialysis.  She was mildly symptomatic with some shortness of breath and chest pain.  ACS ruled out.  She had episode of severe prolonged epistaxis prior to admission.  Also some very mild hematochezia.  GI evaluated while hospitalized, no indication for urgent endoscopic evaluation.  Hematochezia likely secondary to hemorrhoids from constipation.  Anemia overall probably due to ESRD, epistaxis.  GI recommend following up  with Dr. Adela Lank as outpatient given this person's history of gastric ulcers that were clipped endoscopically couple of months ago.  Abdominal pain Crampy lower abdominal pain thought to be due to chronic constipation.  Discharged on aggressive bowel regimen  Left upper extremity AV fistula revision Had this done during recent trip to [place] to visit family.  She underwent dialysis there in the fistula did not stop bleeding afterwards.  It was revised to a graft while she was out of state.  Some concern for infection initially, with very mild dehiscence of one of the incisions.  [facility] evaluated while she was hospitalized and did not feel that further intervention was warranted.  She will follow-up a week or 2 after discharge with them.  ESRD Dialysis per nephrology while hospitalized.  HIV Well-controlled, CD4 count adequate, not immunocompromise.  Discharge  Exam: Feeling better overall.  No major or headache.  Still with some crampy abdominal pain, but not very severe.   Blood pressure (!) 172/84, pulse 75, temperature 98.4 F (36.9 C), temperature source Oral, resp. rate 13, weight 80.3 kg, SpO2 100 %.  No apparent distress Heart rate is normal, rhythm is regular, radial pulses are strong Breathing is regular and unlabored on room air Skin is warm and dry Alert and oriented Pleasant, concordant affect  Pertinent studies and procedures: Imaging Orders         DG Chest 1 View    Lab Orders         MRSA Next Gen by PCR, Nasal         Basic metabolic panel         CBC         Occult blood card to lab, stool         Protime-INR         Hepatic function panel         Renal function panel         CBC         Lipase, blood         Glucose, capillary         Glucose, capillary         Hepatitis B surface antigen         Hepatitis B surface antibody,quantitative         Glucose, capillary         CBC         Renal function panel         Glucose, capillary         Glucose, capillary         Glucose, capillary         Glucose, capillary         Glucose, capillary         Glucose, capillary         Glucose, capillary         Glucose, capillary         POC occult blood, ED     Discharge Instructions:   Discharge Instructions      To Kathy Frank or their caretakers,  They were admitted to [facility] on 12/23/2022 for evaluation and treatment of:  Active Problems:   Human immunodeficiency virus (HIV) disease (HCC)   ESRD on dialysis (HCC)   Abdominal pain   A-V fistula (HCC)  Principal Problem (Resolved):   Acute blood loss anemia  The evaluation suggested acute on chronic anemia. They were treated with dialysis  and blood transfusion x 2.  They were discharged from the hospital on 12/25/22. I recommend the following after leaving the hospital:   Follow-up with the gastroenterologist as  scheduled.  Follow-up with the vascular surgeons within a week or 2 after the leaving the hospital for a checkup of the fistula and likely suture removal.  See your primary care doctor on Monday, May 20 at 9:45 AM.  Start taking pantoprazole twice a day to prevent worsening of ulcers.  Prevent constipation use MiraLAX twice a day.  Take senna at night.  Use bisacodyl suppositories as needed for severe constipation.  Marrianne Mood MD 12/25/2022, 10:49 AM      Marrianne Mood MD 12/25/2022, 7:30 PM

## 2022-12-25 NOTE — Progress Notes (Signed)
D/C order noted. Contacted [facility] to advise clinic of pt's d/c today and that pt should resume care tomorrow.   Marla Pouliot Renal Navigator [phone] 

## 2022-12-26 DIAGNOSIS — D631 Anemia in chronic kidney disease: Secondary | ICD-10-CM | POA: Diagnosis not present

## 2022-12-26 DIAGNOSIS — Z992 Dependence on renal dialysis: Secondary | ICD-10-CM | POA: Diagnosis not present

## 2022-12-26 DIAGNOSIS — R197 Diarrhea, unspecified: Secondary | ICD-10-CM | POA: Diagnosis not present

## 2022-12-26 DIAGNOSIS — I120 Hypertensive chronic kidney disease with stage 5 chronic kidney disease or end stage renal disease: Secondary | ICD-10-CM | POA: Diagnosis not present

## 2022-12-26 DIAGNOSIS — T8249XA Other complication of vascular dialysis catheter, initial encounter: Secondary | ICD-10-CM | POA: Diagnosis not present

## 2022-12-26 DIAGNOSIS — D689 Coagulation defect, unspecified: Secondary | ICD-10-CM | POA: Diagnosis not present

## 2022-12-26 DIAGNOSIS — N2581 Secondary hyperparathyroidism of renal origin: Secondary | ICD-10-CM | POA: Diagnosis not present

## 2022-12-26 DIAGNOSIS — N186 End stage renal disease: Secondary | ICD-10-CM | POA: Diagnosis not present

## 2022-12-26 DIAGNOSIS — A499 Bacterial infection, unspecified: Secondary | ICD-10-CM | POA: Diagnosis not present

## 2022-12-26 LAB — HEPATITIS B SURFACE ANTIBODY, QUANTITATIVE: Hep B S AB Quant (Post): 208 m[IU]/mL (ref >9.9)

## 2022-12-27 ENCOUNTER — Telehealth: Payer: Self-pay | Admitting: Nephrology

## 2022-12-27 NOTE — Telephone Encounter (Signed)
Transition of Care - Initial Contact from Inpatient Facility  Date of discharge: 12/25/22 Date of contact: 12/27/22  Method: Phone Spoke to: Patient  Patient contacted to discuss transition of care from recent inpatient hospital 5/15-5/17/24 ... with discharge diagnosis of  Acute AnemiaMultifactorial thought to be due to recent episodes of severe prolonged epistaxis, ESRD, // Left upper extremity AV fistula revision to graft irritation  VVS eval and see as op   The discharge medication list was reviewed. Patient understands the changes and has no concerns.   Patient will return to his/her outpatient HD unit on: TTS schedule next week   No other concerns at this time.

## 2022-12-28 ENCOUNTER — Telehealth: Payer: Self-pay | Admitting: Vascular Surgery

## 2022-12-28 ENCOUNTER — Encounter: Payer: 59 | Admitting: Student

## 2022-12-28 NOTE — Telephone Encounter (Signed)
Calling to schedule appt, automated vm stated call cannot be completed at this time.

## 2022-12-28 NOTE — Telephone Encounter (Signed)
-----   Message from [facility] [place] sent at 12/25/2022  7:47 AM EDT ----- Left arm fistula revision to AVG in [place] on 4/24  Please arrange follow up with PA in 1-2 weeks from today for incision check/possible suture removal, on [place] office day

## 2022-12-28 NOTE — Telephone Encounter (Signed)
Appt has been scheduled.

## 2022-12-29 ENCOUNTER — Other Ambulatory Visit (HOSPITAL_COMMUNITY): Payer: Self-pay

## 2022-12-29 DIAGNOSIS — A499 Bacterial infection, unspecified: Secondary | ICD-10-CM | POA: Diagnosis not present

## 2022-12-29 DIAGNOSIS — N186 End stage renal disease: Secondary | ICD-10-CM | POA: Diagnosis not present

## 2022-12-29 DIAGNOSIS — D631 Anemia in chronic kidney disease: Secondary | ICD-10-CM | POA: Diagnosis not present

## 2022-12-29 DIAGNOSIS — R197 Diarrhea, unspecified: Secondary | ICD-10-CM | POA: Diagnosis not present

## 2022-12-29 DIAGNOSIS — D689 Coagulation defect, unspecified: Secondary | ICD-10-CM | POA: Diagnosis not present

## 2022-12-29 DIAGNOSIS — Z992 Dependence on renal dialysis: Secondary | ICD-10-CM | POA: Diagnosis not present

## 2022-12-29 DIAGNOSIS — N2581 Secondary hyperparathyroidism of renal origin: Secondary | ICD-10-CM | POA: Diagnosis not present

## 2022-12-29 DIAGNOSIS — I120 Hypertensive chronic kidney disease with stage 5 chronic kidney disease or end stage renal disease: Secondary | ICD-10-CM | POA: Diagnosis not present

## 2022-12-29 DIAGNOSIS — T8249XA Other complication of vascular dialysis catheter, initial encounter: Secondary | ICD-10-CM | POA: Diagnosis not present

## 2022-12-29 DIAGNOSIS — D500 Iron deficiency anemia secondary to blood loss (chronic): Secondary | ICD-10-CM | POA: Diagnosis not present

## 2022-12-31 ENCOUNTER — Other Ambulatory Visit: Payer: Self-pay

## 2022-12-31 ENCOUNTER — Encounter (HOSPITAL_COMMUNITY): Payer: Self-pay | Admitting: Vascular Surgery

## 2022-12-31 ENCOUNTER — Ambulatory Visit (HOSPITAL_BASED_OUTPATIENT_CLINIC_OR_DEPARTMENT_OTHER): Payer: 59 | Admitting: Certified Registered Nurse Anesthetist

## 2022-12-31 ENCOUNTER — Ambulatory Visit (HOSPITAL_COMMUNITY)
Admission: RE | Admit: 2022-12-31 | Discharge: 2022-12-31 | Disposition: A | Discharge status: Home / Self Care | Payer: 59 | Source: Ambulatory Visit | Attending: Vascular Surgery | Admitting: Vascular Surgery

## 2022-12-31 ENCOUNTER — Telehealth: Payer: Self-pay

## 2022-12-31 ENCOUNTER — Encounter (HOSPITAL_COMMUNITY)
Admission: RE | Disposition: A | Discharge status: Home / Self Care | Payer: Self-pay | Source: Ambulatory Visit | Attending: Vascular Surgery

## 2022-12-31 ENCOUNTER — Ambulatory Visit (HOSPITAL_COMMUNITY): Payer: 59 | Admitting: Certified Registered Nurse Anesthetist

## 2022-12-31 DIAGNOSIS — Y832 Surgical operation with anastomosis, bypass or graft as the cause of abnormal reaction of the patient, or of later complication, without mention of misadventure at the time of the procedure: Secondary | ICD-10-CM | POA: Insufficient documentation

## 2022-12-31 DIAGNOSIS — T82898A Other specified complication of vascular prosthetic devices, implants and grafts, initial encounter: Secondary | ICD-10-CM | POA: Diagnosis not present

## 2022-12-31 DIAGNOSIS — N186 End stage renal disease: Secondary | ICD-10-CM | POA: Diagnosis not present

## 2022-12-31 DIAGNOSIS — E1169 Type 2 diabetes mellitus with other specified complication: Secondary | ICD-10-CM

## 2022-12-31 DIAGNOSIS — E1122 Type 2 diabetes mellitus with diabetic chronic kidney disease: Secondary | ICD-10-CM | POA: Diagnosis not present

## 2022-12-31 DIAGNOSIS — Z87891 Personal history of nicotine dependence: Secondary | ICD-10-CM

## 2022-12-31 DIAGNOSIS — Z794 Long term (current) use of insulin: Secondary | ICD-10-CM | POA: Diagnosis not present

## 2022-12-31 DIAGNOSIS — Z8673 Personal history of transient ischemic attack (TIA), and cerebral infarction without residual deficits: Secondary | ICD-10-CM | POA: Insufficient documentation

## 2022-12-31 DIAGNOSIS — I10 Essential (primary) hypertension: Secondary | ICD-10-CM

## 2022-12-31 DIAGNOSIS — T827XXA Infection and inflammatory reaction due to other cardiac and vascular devices, implants and grafts, initial encounter: Secondary | ICD-10-CM | POA: Diagnosis not present

## 2022-12-31 DIAGNOSIS — T8249XA Other complication of vascular dialysis catheter, initial encounter: Secondary | ICD-10-CM | POA: Diagnosis not present

## 2022-12-31 DIAGNOSIS — I120 Hypertensive chronic kidney disease with stage 5 chronic kidney disease or end stage renal disease: Secondary | ICD-10-CM | POA: Insufficient documentation

## 2022-12-31 DIAGNOSIS — Z21 Asymptomatic human immunodeficiency virus [HIV] infection status: Secondary | ICD-10-CM | POA: Insufficient documentation

## 2022-12-31 DIAGNOSIS — I2510 Atherosclerotic heart disease of native coronary artery without angina pectoris: Secondary | ICD-10-CM | POA: Diagnosis not present

## 2022-12-31 DIAGNOSIS — Z992 Dependence on renal dialysis: Secondary | ICD-10-CM | POA: Insufficient documentation

## 2022-12-31 DIAGNOSIS — N2581 Secondary hyperparathyroidism of renal origin: Secondary | ICD-10-CM | POA: Diagnosis not present

## 2022-12-31 DIAGNOSIS — D631 Anemia in chronic kidney disease: Secondary | ICD-10-CM | POA: Diagnosis not present

## 2022-12-31 DIAGNOSIS — R197 Diarrhea, unspecified: Secondary | ICD-10-CM | POA: Diagnosis not present

## 2022-12-31 DIAGNOSIS — E1151 Type 2 diabetes mellitus with diabetic peripheral angiopathy without gangrene: Secondary | ICD-10-CM | POA: Insufficient documentation

## 2022-12-31 DIAGNOSIS — D689 Coagulation defect, unspecified: Secondary | ICD-10-CM | POA: Diagnosis not present

## 2022-12-31 DIAGNOSIS — A499 Bacterial infection, unspecified: Secondary | ICD-10-CM | POA: Diagnosis not present

## 2022-12-31 HISTORY — PX: INCISION AND DRAINAGE: SHX5863

## 2022-12-31 HISTORY — PX: COMPLEX WOUND CLOSURE: SHX6446

## 2022-12-31 HISTORY — PX: APPLICATION OF WOUND VAC: SHX5189

## 2022-12-31 LAB — POCT I-STAT, CHEM 8
BUN: 15 mg/dL (ref 6–20)
Calcium, Ion: 0.84 mmol/L — CL (ref 1.15–1.40)
Chloride: 104 mmol/L (ref 98–111)
Creatinine, Ser: 4.3 mg/dL — ABNORMAL HIGH (ref 0.44–1.00)
Glucose, Bld: 143 mg/dL — ABNORMAL HIGH (ref 70–99)
HCT: 33 % — ABNORMAL LOW (ref 36.0–46.0)
Hemoglobin: 11.2 g/dL — ABNORMAL LOW (ref 12.0–15.0)
Potassium: 5 mmol/L (ref 3.5–5.1)
Sodium: 134 mmol/L — ABNORMAL LOW (ref 135–145)
TCO2: 27 mmol/L (ref 22–32)

## 2022-12-31 LAB — HCG, SERUM, QUALITATIVE: Preg, Serum: NEGATIVE

## 2022-12-31 LAB — GLUCOSE, CAPILLARY
Glucose-Capillary: 131 mg/dL — ABNORMAL HIGH (ref 70–99)
Glucose-Capillary: 151 mg/dL — ABNORMAL HIGH (ref 70–99)

## 2022-12-31 SURGERY — INCISION AND DRAINAGE
Anesthesia: General | Site: Arm Upper | Laterality: Left

## 2022-12-31 MED ORDER — SODIUM CHLORIDE 0.9 % IV SOLN: INTRAVENOUS | Status: DC

## 2022-12-31 MED ORDER — CHLORHEXIDINE GLUCONATE 0.12 % MT SOLN
15.0000 mL | Freq: Once | OROMUCOSAL | Status: AC
  Administered 2022-12-31: 15 mL via OROMUCOSAL
  Filled 2022-12-31: qty 15

## 2022-12-31 MED ORDER — CHLORHEXIDINE GLUCONATE 4 % EX SOLN: 60.0000 mL | Freq: Once | CUTANEOUS | Status: DC

## 2022-12-31 MED ORDER — LIDOCAINE 2% (20 MG/ML) 5 ML SYRINGE
INTRAMUSCULAR | Status: DC | PRN
  Administered 2022-12-31: 60 mg via INTRAVENOUS

## 2022-12-31 MED ORDER — FENTANYL CITRATE (PF) 100 MCG/2ML IJ SOLN
INTRAMUSCULAR | Status: AC
  Filled 2022-12-31: qty 2

## 2022-12-31 MED ORDER — PROPOFOL 10 MG/ML IV BOLUS
INTRAVENOUS | Status: AC
  Filled 2022-12-31: qty 20

## 2022-12-31 MED ORDER — 0.9 % SODIUM CHLORIDE (POUR BTL) OPTIME
TOPICAL | Status: DC | PRN
  Administered 2022-12-31: 1000 mL

## 2022-12-31 MED ORDER — SODIUM CHLORIDE 0.9 % IV SOLN
20.0000 ug | Freq: Once | INTRAVENOUS | Status: AC
  Administered 2022-12-31: 20 ug via INTRAVENOUS
  Filled 2022-12-31: qty 5

## 2022-12-31 MED ORDER — FENTANYL CITRATE (PF) 100 MCG/2ML IJ SOLN
INTRAMUSCULAR | Status: DC | PRN
  Administered 2022-12-31: 50 ug via INTRAVENOUS
  Administered 2022-12-31 (×2): 25 ug via INTRAVENOUS

## 2022-12-31 MED ORDER — HEPARIN SODIUM (PORCINE) 1000 UNIT/ML IJ SOLN
2.0000 mL | Freq: Once | INTRAMUSCULAR | Status: AC
  Administered 2022-12-31: 1.8 mL
  Filled 2022-12-31: qty 2

## 2022-12-31 MED ORDER — FENTANYL CITRATE (PF) 250 MCG/5ML IJ SOLN
INTRAMUSCULAR | Status: AC
  Filled 2022-12-31: qty 5

## 2022-12-31 MED ORDER — CEFAZOLIN SODIUM-DEXTROSE 2-4 GM/100ML-% IV SOLN
2.0000 g | INTRAVENOUS | Status: AC
  Administered 2022-12-31: 2 g via INTRAVENOUS
  Filled 2022-12-31: qty 100

## 2022-12-31 MED ORDER — ORAL CARE MOUTH RINSE: 15.0000 mL | Freq: Once | OROMUCOSAL | Status: AC

## 2022-12-31 MED ORDER — HEMOSTATIC AGENTS (NO CHARGE) OPTIME
TOPICAL | Status: DC | PRN
  Administered 2022-12-31 (×2): 1 via TOPICAL

## 2022-12-31 MED ORDER — ACETAMINOPHEN 10 MG/ML IV SOLN
INTRAVENOUS | Status: AC
  Filled 2022-12-31: qty 100

## 2022-12-31 MED ORDER — MIDAZOLAM HCL 2 MG/2ML IJ SOLN
INTRAMUSCULAR | Status: DC | PRN
  Administered 2022-12-31: 2 mg via INTRAVENOUS

## 2022-12-31 MED ORDER — ONDANSETRON HCL 4 MG/2ML IJ SOLN
INTRAMUSCULAR | Status: DC | PRN
  Administered 2022-12-31: 4 mg via INTRAVENOUS

## 2022-12-31 MED ORDER — INSULIN ASPART 100 UNIT/ML IJ SOLN: 0.0000 [IU] | INTRAMUSCULAR | Status: DC | PRN

## 2022-12-31 MED ORDER — PROPOFOL 10 MG/ML IV BOLUS
INTRAVENOUS | Status: DC | PRN
  Administered 2022-12-31: 150 mg via INTRAVENOUS

## 2022-12-31 MED ORDER — MIDAZOLAM HCL 2 MG/2ML IJ SOLN
INTRAMUSCULAR | Status: AC
  Filled 2022-12-31: qty 2

## 2022-12-31 MED ORDER — ACETAMINOPHEN 10 MG/ML IV SOLN
1000.0000 mg | Freq: Once | INTRAVENOUS | Status: DC | PRN
  Administered 2022-12-31: 1000 mg via INTRAVENOUS

## 2022-12-31 MED ORDER — OXYCODONE-ACETAMINOPHEN 5-325 MG PO TABS: 1.0000 | ORAL_TABLET | ORAL | 0 refills | Status: DC | PRN

## 2022-12-31 MED ORDER — FENTANYL CITRATE (PF) 100 MCG/2ML IJ SOLN
25.0000 ug | INTRAMUSCULAR | Status: DC | PRN
  Administered 2022-12-31 (×3): 50 ug via INTRAVENOUS

## 2022-12-31 MED ORDER — DEXAMETHASONE SODIUM PHOSPHATE 10 MG/ML IJ SOLN
INTRAMUSCULAR | Status: DC | PRN
  Administered 2022-12-31: 4 mg via INTRAVENOUS

## 2022-12-31 SURGICAL SUPPLY — 33 items
BAG COUNTER SPONGE SURGICOUNT (BAG) ×1 IMPLANT
BAG SPNG CNTER NS LX DISP (BAG) ×1
BNDG CMPR 5X6 CHSV STRCH STRL (GAUZE/BANDAGES/DRESSINGS) ×1
BNDG COHESIVE 6X5 TAN ST LF (GAUZE/BANDAGES/DRESSINGS) IMPLANT
CANISTER SUCT 3000ML PPV (MISCELLANEOUS) ×1 IMPLANT
CLIP TI MEDIUM 6 (CLIP) IMPLANT
COVER ULTRASOUND PROBE 36 ST (MISCELLANEOUS) IMPLANT
DRESSING PREVENA PLUS CUSTOM (GAUZE/BANDAGES/DRESSINGS) IMPLANT
DRSG PREVENA PLUS CUSTOM (GAUZE/BANDAGES/DRESSINGS) ×1
ELECT REM PT RETURN 9FT ADLT (ELECTROSURGICAL) ×1
ELECTRODE REM PT RTRN 9FT ADLT (ELECTROSURGICAL) ×1 IMPLANT
GLOVE BIO SURGEON STRL SZ8 (GLOVE) IMPLANT
GOWN STRL REIN 2XL LVL4 (GOWN DISPOSABLE) IMPLANT
GOWN STRL REUS W/ TWL LRG LVL3 (GOWN DISPOSABLE) ×2 IMPLANT
GOWN STRL REUS W/ TWL XL LVL3 (GOWN DISPOSABLE) ×1 IMPLANT
GOWN STRL REUS W/TWL LRG LVL3 (GOWN DISPOSABLE)
GOWN STRL REUS W/TWL XL LVL3 (GOWN DISPOSABLE) ×1
HEMOSTAT SNOW SURGICEL 2X4 (HEMOSTASIS) IMPLANT
KIT BASIN OR (CUSTOM PROCEDURE TRAY) ×1 IMPLANT
KIT DRSG PREVENA PLUS 7DAY 125 (MISCELLANEOUS) IMPLANT
KIT TURNOVER KIT B (KITS) ×1 IMPLANT
NS IRRIG 1000ML POUR BTL (IV SOLUTION) ×1 IMPLANT
PAD ARMBOARD 7.5X6 YLW CONV (MISCELLANEOUS) ×2 IMPLANT
POWDER MYRIAD MORCELLS 1000MG (Miscellaneous) IMPLANT
STAPLER VISISTAT 35W (STAPLE) IMPLANT
SUT ETHILON 2 0 FS 18 (SUTURE) IMPLANT
SUT MNCRL AB 4-0 PS2 18 (SUTURE) IMPLANT
SUT VIC AB 2-0 CT1 18 (SUTURE) IMPLANT
SUT VIC AB 3-0 SH 27 (SUTURE) ×1
SUT VIC AB 3-0 SH 27X BRD (SUTURE) IMPLANT
TOWEL GREEN STERILE (TOWEL DISPOSABLE) ×2 IMPLANT
TOWEL GREEN STERILE FF (TOWEL DISPOSABLE) ×1 IMPLANT
WATER STERILE IRR 1000ML POUR (IV SOLUTION) ×1 IMPLANT

## 2022-12-31 NOTE — Telephone Encounter (Signed)
Dr. Lenell Antu called to add on an I&D/washout of pt's infected L arm. She had an AVF fistula revision with goretex graft pseudoaneurysm repair on 12/02/22. HD center describes several incisions with one that has dehiscence, swelling, and pus-like drainage. She is currently in dialysis receiving antibiotics.  Pt has not eaten since last night and only had some lemonade this morning at approx 0530. Case added, OR called, and orders placed.

## 2022-12-31 NOTE — Progress Notes (Signed)
[facility] PROGRESS NOTE  ASSESSMENT / PLAN: Kathy Frank is a 47 y.o. female with ESRD on HD TTS via TDC. Recent left arm AVF revision and conversion to AVG (done in [place]) now with purulence at the incisions sites medially.   After discussing the risks and benefits of left arm fistula revision which will include wound irrigation and debridement, Kathy Frank elected to proceed.   SUBJECTIVE: Doing OK. No fever or chills. Notes purulence, no odor  OBJECTIVE: BP (!) 198/84   Pulse 93   Temp 98.8 F (37.1 C) (Oral)   Resp 18   Ht 5\' 5"  (1.651 m)   Wt 80.3 kg   SpO2 100%   BMI 29.46 kg/m  No intake or output data in the 24 hours ending 12/31/22 1224   Chronically ill Left arm incisions dirty, nonhealing. Suture in place. Purulence  Current right TDC     Latest Ref Rng & Units 12/31/2022   11:41 AM 12/25/2022    9:05 AM 12/24/2022    7:32 AM  CBC  WBC 4.0 - 10.5 K/uL  5.5  4.1   Hemoglobin 12.0 - 15.0 g/dL 16.1  9.1  6.8   Hematocrit 36.0 - 46.0 % 33.0  28.6  22.2   Platelets 150 - 400 K/uL  238  234         Latest Ref Rng & Units 12/31/2022   11:41 AM 12/25/2022    9:05 AM 12/24/2022    7:32 AM  CMP  Glucose 70 - 99 mg/dL 096  045  78   BUN 6 - 20 mg/dL 15  19  36   Creatinine 0.44 - 1.00 mg/dL 4.09  8.11  9.14   Sodium 135 - 145 mmol/L 134  133  136   Potassium 3.5 - 5.1 mmol/L 5.0  4.5  4.5   Chloride 98 - 111 mmol/L 104  94  98   CO2 22 - 32 mmol/L  24  19   Calcium 8.9 - 10.3 mg/dL  8.3  8.4     Estimated Creatinine Clearance: 17.1 mL/min (A) (by C-G formula based on SCr of 4.3 mg/dL (H)).  Kathy Sparrow MD [facility] Phone Number: [phone] 12/31/2022 12:24 PM

## 2022-12-31 NOTE — Op Note (Signed)
    NAME: [name]    MRN: [id number] DOB: 11-13-1975    DATE OF OPERATION: 12/31/2022  PREOP DIAGNOSIS:    Left arm AV graft wound infection  POSTOP DIAGNOSIS:    Same  PROCEDURE:    Left arm incision and debridement of previous surgical wound Complex skin and soft tissue closure 8 x 3 x 2cm, 10 x 4 x 2cm, 7 x 3 x 2cm  SURGEON: Victorino Sparrow  ASSIST: Emilie Rutter, PA  ANESTHESIA: General  EBL: 50 mL  INDICATIONS:    Kathy Frank is a 47 y.o. female with end-stage renal disease and history of left-sided brachiocephalic fistula status post aneurysmal resection, revision using interposition AV graft - all of this was completed in [place] several weeks ago.  She has struggled with wound healing  FINDINGS:   Devitalized tissue within the wound beds, poor wound healing due to ischemic closure No purulence, graft not involved in wound beds  TECHNIQUE:   Patient was brought to the OR laid in the supine position.  General anesthesia was induced and patient was prepped and draped in standard fashion.  The case began with ultrasound insonation of the left arm AV graft.  This was tunneled lateral to the multiple horizontal incisions which were used to resect the aneurysms once present.  The horizontal incisions were closed using a running Prolene suture.  This was all removed..  There were multiple areas that had dehisced prior to removal.  All 4 of the incisions were, and devitalized tissue removed.  There was no purulence appreciated, the graft was well incorporated, and not visible.  Devitalized margins of the wound beds were resected sharply.  There was significant distance that required significant undermining of the skin and soft tissue superficial to the bicep to close the wounds without tension.  We had more cell powder-1000 mg were placed in the wound bed to promote healing.  Wounds were closed using 2-0 Vicryl suture with staples at the level of the skin.   In 2 areas, vertical mattress nylon sutures were placed as the tissue would not support stable placement.  A Prevena VAC was placed on the upper arm with plans to leave in place for 1 week prior to removal.  On completion, the patient had a palpable thrill within the AV graft.    Ladonna Snide, MD [facility] DATE OF DICTATION:   12/31/2022

## 2022-12-31 NOTE — Anesthesia Preprocedure Evaluation (Addendum)
Anesthesia Evaluation  Patient identified by MRN, date of birth, ID band Patient awake    Reviewed: Allergy & Precautions, NPO status , Patient's Chart, lab work & pertinent test results  Airway Mallampati: II  TM Distance: >3 FB Neck ROM: Full    Dental  (+) Lower Dentures   Pulmonary shortness of breath (2L [place] with dialysis), former smoker   Pulmonary exam normal        Cardiovascular hypertension, Pt. on medications + CAD and + Peripheral Vascular Disease   Rhythm:Regular Rate:Normal  ECHO 2022:  1. Left ventricular ejection fraction, by estimation, is 60 to 65%. The  left ventricle has normal function. The left ventricle has no regional  wall motion abnormalities. There is moderate left ventricular hypertrophy.  Left ventricular diastolic  parameters are indeterminate.   2. Right ventricular systolic function is normal. The right ventricular  size is normal.   3. Left atrial size was mildly dilated.   4. The pericardial effusion is lateral to the left ventricle.   5. The mitral valve is degenerative. Trivial mitral valve regurgitation.  No evidence of mitral stenosis. Severe mitral annular calcification.   6. The aortic valve is calcified. Aortic valve regurgitation is trivial.  Moderate sclerosis with no stenosis.   7. The inferior vena cava is normal in size with greater than 50%  respiratory variability, suggesting right atrial pressure of 3 mmHg.      Neuro/Psych  Headaches  Anxiety Depression    CVA    GI/Hepatic Neg liver ROS, PUD,GERD  Medicated,,  Endo/Other  diabetes, Type 2, Insulin Dependent    Renal/GU ESRF and DialysisRenal disease  negative genitourinary   Musculoskeletal negative musculoskeletal ROS (+)    Abdominal Normal abdominal exam  (+)   Peds  Hematology  (+) Blood dyscrasia, anemia , HIVLab Results      Component                Value               Date                      WBC                       5.5                 12/25/2022                HGB                      11.2 (L)            12/31/2022                HCT                      33.0 (L)            12/31/2022                MCV                      92.3                12/25/2022                PLT  238                 12/25/2022             Lab Results      Component                Value               Date                      NA                       134 (L)             12/31/2022                K                        5.0                 12/31/2022                CO2                      24                  12/25/2022                GLUCOSE                  143 (H)             12/31/2022                BUN                      15                  12/31/2022                CREATININE               4.30 (H)            12/31/2022                CALCIUM                  8.3 (L)             12/25/2022                GFRNONAA                 10 (L)              12/25/2022              Anesthesia Other Findings   Reproductive/Obstetrics                             Anesthesia Physical Anesthesia Plan  ASA: 3  Anesthesia Plan: General   Post-op Pain Management:    Induction: Intravenous  PONV Risk Score and Plan: 3 and Ondansetron, Dexamethasone, Midazolam and Treatment may vary due to age or medical condition  Airway Management Planned: Mask and LMA  Additional Equipment: None  Intra-op Plan:   Post-operative Plan: Extubation in OR  Informed Consent: I have  reviewed the patients History and Physical, chart, labs and discussed the procedure including the risks, benefits and alternatives for the proposed anesthesia with the patient or authorized representative who has indicated his/her understanding and acceptance.     Dental advisory given  Plan Discussed with: CRNA  Anesthesia Plan Comments:        Anesthesia Quick Evaluation

## 2022-12-31 NOTE — Discharge Instructions (Addendum)
   [facility]   CONTINUE VAC ON LEFT ARM UNTIL SEEN IN OFFICE 01/08/23   Discharge Instructions  AV Fistula or Graft Surgery for Dialysis Access  Please refer to the following instructions for your post-procedure care. Your surgeon or physician assistant will discuss any changes with you.  Activity  You may drive the day following your surgery, if you are comfortable and no longer taking prescription pain medication. Resume full activity as the soreness in your incision resolves.  Bathing/Showering  You may shower after you go home. Keep your incision dry for 48 hours. Do not soak in a bathtub, hot tub, or swim until the incision heals completely. You may not shower if you have a hemodialysis catheter.  Incision Care  Clean your incision with mild soap and water after 48 hours. Pat the area dry with a clean towel. You do not need a bandage unless otherwise instructed. Do not apply any ointments or creams to your incision. You may have skin glue on your incision. Do not peel it off. It will come off on its own in about one week. Your arm may swell a bit after surgery. To reduce swelling use pillows to elevate your arm so it is above your heart. Your doctor will tell you if you need to lightly wrap your arm with an ACE bandage.  Diet  Resume your normal diet. There are not special food restrictions following this procedure. In order to heal from your surgery, it is CRITICAL to get adequate nutrition. Your body requires vitamins, minerals, and protein. Vegetables are the best source of vitamins and minerals. Vegetables also provide the perfect balance of protein. Processed food has little nutritional value, so try to avoid this.  Medications  Resume taking all of your medications. If your incision is causing pain, you may take over-the counter pain relievers such as acetaminophen (Tylenol). If you were prescribed a stronger pain medication, please be aware  these medications can cause nausea and constipation. Prevent nausea by taking the medication with a snack or meal. Avoid constipation by drinking plenty of fluids and eating foods with high amount of fiber, such as fruits, vegetables, and grains. Do not take Tylenol if you are taking prescription pain medications.     Follow up Your surgeon may want to see you in the office following your access surgery. If so, this will be arranged at the time of your surgery.  Please call us immediately for any of the following conditions:  Increased pain, redness, drainage (pus) from your incision site Fever of 101 degrees or higher Severe or worsening pain at your incision site Hand pain or numbness.  Reduce your risk of vascular disease:  Stop smoking. If you would like help, call [facility] at 1-800-QUIT-NOW ((332)549-3981) or [facility] at [phone]  Manage your cholesterol Maintain a desired weight Control your diabetes Keep your blood pressure down  Dialysis  It will take several weeks to several months for your new dialysis access to be ready for use. Your surgeon will determine when it is OK to use it. Your nephrologist will continue to direct your dialysis. You can continue to use your Permcath until your new access is ready for use.  If you have any questions, please call the office at [phone].

## 2022-12-31 NOTE — Transfer of Care (Signed)
Immediate Anesthesia Transfer of Care Note  Patient: Kathy Frank  Procedure(s) Performed: INCISION AND DRAINAGE OF LEFT ARM (Left: Arm Upper) APPLICATION OF PREVENA WOUND VAC (Left: Arm Upper) COMPLEX WOUND CLOSURE (Left: Arm Upper)  Patient Location: PACU  Anesthesia Type:General  Level of Consciousness: drowsy  Airway & Oxygen Therapy: Patient Spontanous Breathing  Post-op Assessment: Report given to RN, Post -op Vital signs reviewed and stable, and Patient moving all extremities  Post vital signs: Reviewed and stable  Last Vitals:  Vitals Value Taken Time  BP 185/77 12/31/22 1437  Temp    Pulse 77 12/31/22 1439  Resp 14 12/31/22 1439  SpO2 100 % 12/31/22 1439  Vitals shown include unvalidated device data.  Last Pain:  Vitals:   12/31/22 1119  TempSrc: Oral         Complications: No notable events documented.

## 2022-12-31 NOTE — Anesthesia Procedure Notes (Signed)
Procedure Name: LMA Insertion Date/Time: 12/31/2022 1:13 PM  Performed by: Jodell Cipro, CRNAPre-anesthesia Checklist: Patient identified, Emergency Drugs available, Suction available and Patient being monitored Patient Re-evaluated:Patient Re-evaluated prior to induction Oxygen Delivery Method: Circle System Utilized Preoxygenation: Pre-oxygenation with 100% oxygen Induction Type: IV induction Ventilation: Mask ventilation without difficulty LMA: LMA inserted LMA Size: 4.0 Number of attempts: 1 Placement Confirmation: positive ETCO2 Tube secured with: Tape Dental Injury: Teeth and Oropharynx as per pre-operative assessment

## 2023-01-01 ENCOUNTER — Encounter (HOSPITAL_COMMUNITY): Payer: Self-pay | Admitting: Vascular Surgery

## 2023-01-01 ENCOUNTER — Other Ambulatory Visit (HOSPITAL_COMMUNITY): Payer: Self-pay

## 2023-01-01 NOTE — Progress Notes (Signed)
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 She underwent a left arm incision and debridement of previous surgical wound yesterday by Dr. Karin Lieu. I was informed today by the charge HD RN at [facility] of surgical wound culture tested positive for gram positive cocci in pairs, chains, and groups. Patient was already on IV Vanc and Fortaz at [facility] prior to going to scheduled procedure. I discussed case with Dr. Allena Katz. Noted discharge order was in place here. Please note that patient was never admitted. Plan for Ms. Woodle to continue IV Vanc and Elita Quick with HD for 2 more weeks. I alerted Dr. Lenell Antu of our plan today and also the [facility] HD Charge RN. ABX orders are in place to start when she returns for HD tomorrow (01/02/23).  Addendum: Informed by HD Charge RN at [facility] that surgical wound culture tested (+) for staphylococcus aureus. I advised to continue the IV Vanc and Elita Quick for now as we are waiting for the susceptibility report. Plan to make further changes to ABX regimen once we have those results. I advise that the [facility] HD RN to notify the APP on-call over the weekend to let us know of the susceptibility report.  Salome Holmes, NP [facility]

## 2023-01-01 NOTE — Anesthesia Postprocedure Evaluation (Signed)
Anesthesia Post Note  Patient: MARGART JACKSON  Procedure(s) Performed: INCISION AND DRAINAGE OF LEFT ARM (Left: Arm Upper) APPLICATION OF PREVENA WOUND VAC (Left: Arm Upper) COMPLEX WOUND CLOSURE (Left: Arm Upper)     Patient location during evaluation: PACU Anesthesia Type: General Level of consciousness: awake and alert Pain management: pain level controlled Vital Signs Assessment: post-procedure vital signs reviewed and stable Respiratory status: spontaneous breathing, nonlabored ventilation and respiratory function stable Cardiovascular status: stable and blood pressure returned to baseline Anesthetic complications: no   No notable events documented.  Last Vitals:  Vitals:   12/31/22 1515 12/31/22 1530  BP: (!) 204/86 (!) 199/85  Pulse: 76 75  Resp: 11 12  Temp:  37.1 C  SpO2: 93% 93%    Last Pain:  Vitals:   12/31/22 1530  TempSrc:   PainSc: 5    Pain Goal: Patients Stated Pain Goal: 3 (12/31/22 1500)                 Beryle Lathe

## 2023-01-02 DIAGNOSIS — D631 Anemia in chronic kidney disease: Secondary | ICD-10-CM | POA: Diagnosis not present

## 2023-01-02 DIAGNOSIS — D689 Coagulation defect, unspecified: Secondary | ICD-10-CM | POA: Diagnosis not present

## 2023-01-02 DIAGNOSIS — Z992 Dependence on renal dialysis: Secondary | ICD-10-CM | POA: Diagnosis not present

## 2023-01-02 DIAGNOSIS — N2581 Secondary hyperparathyroidism of renal origin: Secondary | ICD-10-CM | POA: Diagnosis not present

## 2023-01-02 DIAGNOSIS — T8249XA Other complication of vascular dialysis catheter, initial encounter: Secondary | ICD-10-CM | POA: Diagnosis not present

## 2023-01-02 DIAGNOSIS — R197 Diarrhea, unspecified: Secondary | ICD-10-CM | POA: Diagnosis not present

## 2023-01-02 DIAGNOSIS — N186 End stage renal disease: Secondary | ICD-10-CM | POA: Diagnosis not present

## 2023-01-02 DIAGNOSIS — I120 Hypertensive chronic kidney disease with stage 5 chronic kidney disease or end stage renal disease: Secondary | ICD-10-CM | POA: Diagnosis not present

## 2023-01-02 DIAGNOSIS — A499 Bacterial infection, unspecified: Secondary | ICD-10-CM | POA: Diagnosis not present

## 2023-01-05 DIAGNOSIS — N186 End stage renal disease: Secondary | ICD-10-CM | POA: Diagnosis not present

## 2023-01-05 DIAGNOSIS — N2581 Secondary hyperparathyroidism of renal origin: Secondary | ICD-10-CM | POA: Diagnosis not present

## 2023-01-05 DIAGNOSIS — T8249XA Other complication of vascular dialysis catheter, initial encounter: Secondary | ICD-10-CM | POA: Diagnosis not present

## 2023-01-05 DIAGNOSIS — R197 Diarrhea, unspecified: Secondary | ICD-10-CM | POA: Diagnosis not present

## 2023-01-05 DIAGNOSIS — Z992 Dependence on renal dialysis: Secondary | ICD-10-CM | POA: Diagnosis not present

## 2023-01-05 DIAGNOSIS — A499 Bacterial infection, unspecified: Secondary | ICD-10-CM | POA: Diagnosis not present

## 2023-01-05 DIAGNOSIS — D631 Anemia in chronic kidney disease: Secondary | ICD-10-CM | POA: Diagnosis not present

## 2023-01-05 DIAGNOSIS — D689 Coagulation defect, unspecified: Secondary | ICD-10-CM | POA: Diagnosis not present

## 2023-01-05 DIAGNOSIS — I120 Hypertensive chronic kidney disease with stage 5 chronic kidney disease or end stage renal disease: Secondary | ICD-10-CM | POA: Diagnosis not present

## 2023-01-07 ENCOUNTER — Encounter: Payer: Self-pay | Admitting: *Deleted

## 2023-01-07 DIAGNOSIS — D689 Coagulation defect, unspecified: Secondary | ICD-10-CM | POA: Diagnosis not present

## 2023-01-07 DIAGNOSIS — N2581 Secondary hyperparathyroidism of renal origin: Secondary | ICD-10-CM | POA: Diagnosis not present

## 2023-01-07 DIAGNOSIS — A499 Bacterial infection, unspecified: Secondary | ICD-10-CM | POA: Diagnosis not present

## 2023-01-07 DIAGNOSIS — Z992 Dependence on renal dialysis: Secondary | ICD-10-CM | POA: Diagnosis not present

## 2023-01-07 DIAGNOSIS — T8249XA Other complication of vascular dialysis catheter, initial encounter: Secondary | ICD-10-CM | POA: Diagnosis not present

## 2023-01-07 DIAGNOSIS — I120 Hypertensive chronic kidney disease with stage 5 chronic kidney disease or end stage renal disease: Secondary | ICD-10-CM | POA: Diagnosis not present

## 2023-01-07 DIAGNOSIS — R197 Diarrhea, unspecified: Secondary | ICD-10-CM | POA: Diagnosis not present

## 2023-01-07 DIAGNOSIS — D631 Anemia in chronic kidney disease: Secondary | ICD-10-CM | POA: Diagnosis not present

## 2023-01-07 DIAGNOSIS — N186 End stage renal disease: Secondary | ICD-10-CM | POA: Diagnosis not present

## 2023-01-07 NOTE — H&P (Signed)
See Dr. Karin Lieu progress note from day of surgery for H&P details.  Rande Brunt. Lenell Antu, MD [facility] Phone Number: [phone] 01/07/2023 7:29 AM

## 2023-01-08 ENCOUNTER — Ambulatory Visit (INDEPENDENT_AMBULATORY_CARE_PROVIDER_SITE_OTHER): Payer: 59 | Admitting: Physician Assistant

## 2023-01-08 VITALS — BP 181/82 | HR 77 | Temp 97.9°F | Resp 16 | Ht 65.0 in | Wt 177.0 lb

## 2023-01-08 DIAGNOSIS — Z992 Dependence on renal dialysis: Secondary | ICD-10-CM

## 2023-01-08 DIAGNOSIS — E1129 Type 2 diabetes mellitus with other diabetic kidney complication: Secondary | ICD-10-CM | POA: Diagnosis not present

## 2023-01-08 DIAGNOSIS — N186 End stage renal disease: Secondary | ICD-10-CM | POA: Diagnosis not present

## 2023-01-08 NOTE — Progress Notes (Unsigned)
POST OPERATIVE OFFICE NOTE    CC:  F/u for surgery  HPI:  This is a 47 y.o. female who is s/p *** on *** by Dr. Marland Kitchen    Pt returns today for follow up.  Pt states ***   No Known Allergies  Current Outpatient Medications  Medication Sig Dispense Refill   Accu-Chek Softclix Lancets lancets Use as instructed 100 each 12   acetaminophen (TYLENOL) 500 MG tablet Take 2 tablets (1,000 mg total) by mouth every 6 (six) hours. 30 tablet 0   albuterol (VENTOLIN HFA) 108 (90 Base) MCG/ACT inhaler Inhale 2 puffs into the lungs every 4 (four) hours as needed for wheezing or shortness of breath. 8.5 g 0   amLODipine (NORVASC) 10 MG tablet Take 1 tablet (10 mg total) by mouth daily. 30 tablet 0   atorvastatin (LIPITOR) 80 MG tablet Take 1 tablet (80 mg total) by mouth daily. 30 tablet 11   bictegravir-emtricitabine-tenofovir AF (BIKTARVY) 50-200-25 MG TABS tablet Take 1 tablet by mouth daily. OVERDUE FOR OFFICE FOLLOW UP - PLEASE CALL OUR OFFICE TO SCHEDULE [phone] (Patient taking differently: Take 1 tablet by mouth daily.) 30 tablet 11   bisacodyl (DULCOLAX) 10 MG suppository Place 1 suppository (10 mg total) rectally as needed for moderate constipation. 12 suppository 0   calcitRIOL (ROCALTROL) 0.25 MCG capsule Take 5 capsules (1.25 mcg total) by mouth Every Tuesday,Thursday,and Saturday with dialysis. 60 capsule 0   calcium acetate (PHOSLO) 667 MG capsule Take 2 capsules (1,334 mg total) by mouth 3 (three) times daily with meals. 180 capsule 0   Continuous Blood Gluc Receiver (DEXCOM G7 RECEIVER) DEVI Use to check blood sugars cntinuously 5 each 5   Continuous Blood Gluc Sensor (DEXCOM G7 SENSOR) MISC Use to check blood sugar continuously 10 each 6   Darbepoetin Alfa (ARANESP) 200 MCG/0.4ML SOSY injection Inject 0.4 mLs (200 mcg total) into the skin every Monday at 6 PM. (Patient taking differently: Inject 200 mcg into the skin every 7 (seven) days. Every Monday) 1.68 mL    diclofenac Sodium  (VOLTAREN) 1 % GEL Apply 4 g topically 4 (four) times daily. (Patient taking differently: Apply 4 g topically daily as needed (for pain).) 100 g 0   EQ ASPIRIN ADULT LOW DOSE 81 MG tablet TAKE 1 TABLET BY MOUTH ONCE DAILY. SWALLOW WHOLE. (Patient taking differently: Take 81 mg by mouth daily.) 90 tablet 0   escitalopram (LEXAPRO) 5 MG tablet Take 1 tablet (5 mg total) by mouth daily. 30 tablet 2   gabapentin (NEURONTIN) 100 MG capsule Take 100 mg by mouth at bedtime.     glucose blood test strip Use as instructed 100 each 12   hydrocortisone-pramoxine (PROCTOFOAM-HC) rectal foam Place 1 applicator rectally 2 (two) times daily. 10 g 1   insulin glargine (LANTUS SOLOSTAR) 100 UNIT/ML Solostar Pen Inject 15 Units into the skin at bedtime. 6 mL 4   Insulin Pen Needle (PENTIPS) 32G X 4 MM MISC Use as directed 100 each 3   lidocaine (LIDODERM) 5 % Place 1 patch onto the skin daily. Remove & Discard patch within 12 hours or as directed by MD (Patient taking differently: Place 1 patch onto the skin daily as needed (For pain).) 30 patch 0   liraglutide (VICTOZA) 18 MG/3ML SOPN Inject 0.6 mg into the skin daily. 9 mL 4   LOKELMA 5 g packet Take 5 g by mouth daily.     multivitamin (RENA-VIT) TABS tablet Take 1 tablet by mouth daily. (Patient  taking differently: Take 1 tablet by mouth See admin instructions. On Tuesday, Wednesday and Thursday) 30 tablet 0   ondansetron (ZOFRAN) 8 MG tablet Take 1 tablet (8 mg total) by mouth every 8 (eight) hours as needed for nausea or vomiting. 12 tablet 0   oxyCODONE-acetaminophen (PERCOCET) 5-325 MG tablet Take 1 tablet by mouth every 4 (four) hours as needed for severe pain. 30 tablet 0   pantoprazole (PROTONIX) 40 MG tablet Take 1 tablet (40 mg total) by mouth 2 (two) times daily. 60 tablet 2   polyethylene glycol (MIRALAX / GLYCOLAX) 17 g packet Take 17 g by mouth 2 (two) times daily. 14 each 0   senna-docusate (SENOKOT-S) 8.6-50 MG tablet Take 1 tablet by mouth at  bedtime. 30 tablet 2   Ubrogepant (UBRELVY) 50 MG TABS Take 1 tablet (50 mg total) by mouth as needed (migraine headache). if needed a second dose may be taken at least 2 hours after the initial dose; MAX 8 doses in a month 8 tablet 3   No current facility-administered medications for this visit.     ROS:  See HPI  Physical Exam:  ***  Incision:  *** Extremities:  *** Neuro: *** Abdomen:  ***    Assessment/Plan:  This is a 47 y.o. female who is s/p: ***  -Wound vac got caught on wheelchair, fell off yesterday. Minimal to no drainage of incisions.  Denies any fevers.  Arm does not hurt anymore.  -She will follow-up in 2 weeks for staple and suture removal -TDC is functioning fine   Loel Dubonnet, PA-C [facility] [phone]   Clinic MD:  ***

## 2023-01-12 ENCOUNTER — Ambulatory Visit: Payer: 59

## 2023-01-15 ENCOUNTER — Encounter (HOSPITAL_COMMUNITY): Payer: Self-pay

## 2023-01-22 ENCOUNTER — Ambulatory Visit (INDEPENDENT_AMBULATORY_CARE_PROVIDER_SITE_OTHER): Payer: Medicare Other | Admitting: Physician Assistant

## 2023-01-22 ENCOUNTER — Encounter: Payer: Self-pay | Admitting: Physician Assistant

## 2023-01-22 VITALS — BP 157/83 | HR 65 | Temp 98.0°F | Resp 14

## 2023-01-22 DIAGNOSIS — N186 End stage renal disease: Secondary | ICD-10-CM

## 2023-01-22 DIAGNOSIS — Z992 Dependence on renal dialysis: Secondary | ICD-10-CM

## 2023-01-22 NOTE — Progress Notes (Signed)
POST OPERATIVE OFFICE NOTE    CC:  F/u for surgery  HPI:  Kathy Frank is a 47 y.o. female who is s/p debridement and washout of previous multiple left upper arm incisions with complex skin and soft tissue closure on 12/31/2022 by Dr. Karin Lieu.  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  Unfortunately her incisions from the aneurysm resection became infected so she required surgical debridement.  No graft involvement in the wound bed was noted during her last visit to the OR 12/31/22.    She is here today for exam and to have suture/staples removed.  She is pleased with her recovery.  HD MWF on [address].  No Known Allergies  Current Outpatient Medications  Medication Sig Dispense Refill   Accu-Chek Softclix Lancets lancets Use as instructed 100 each 12   acetaminophen (TYLENOL) 500 MG tablet Take 2 tablets (1,000 mg total) by mouth every 6 (six) hours. 30 tablet 0   albuterol (VENTOLIN HFA) 108 (90 Base) MCG/ACT inhaler Inhale 2 puffs into the lungs every 4 (four) hours as needed for wheezing or shortness of breath. 8.5 g 0   amLODipine (NORVASC) 10 MG tablet Take 1 tablet (10 mg total) by mouth daily. 30 tablet 0   atorvastatin (LIPITOR) 80 MG tablet Take 1 tablet (80 mg total) by mouth daily. 30 tablet 11   bictegravir-emtricitabine-tenofovir AF (BIKTARVY) 50-200-25 MG TABS tablet Take 1 tablet by mouth daily. OVERDUE FOR OFFICE FOLLOW UP - PLEASE CALL OUR OFFICE TO SCHEDULE [phone] (Patient taking differently: Take 1 tablet by mouth daily.) 30 tablet 11   bisacodyl (DULCOLAX) 10 MG suppository Place 1 suppository (10 mg total) rectally as needed for moderate constipation. 12 suppository 0   calcitRIOL (ROCALTROL) 0.25 MCG capsule Take 5 capsules (1.25 mcg total) by mouth Every Tuesday,Thursday,and Saturday with dialysis. 60 capsule 0   calcium  acetate (PHOSLO) 667 MG capsule Take 2 capsules (1,334 mg total) by mouth 3 (three) times daily with meals. 180 capsule 0   Continuous Blood Gluc Receiver (DEXCOM G7 RECEIVER) DEVI Use to check blood sugars cntinuously 5 each 5   Continuous Blood Gluc Sensor (DEXCOM G7 SENSOR) MISC Use to check blood sugar continuously 10 each 6   Darbepoetin Alfa (ARANESP) 200 MCG/0.4ML SOSY injection Inject 0.4 mLs (200 mcg total) into the skin every Monday at 6 PM. (Patient taking differently: Inject 200 mcg into the skin every 7 (seven) days. Every Monday) 1.68 mL    diclofenac Sodium (VOLTAREN) 1 % GEL Apply 4 g topically 4 (four) times daily. (Patient taking differently: Apply 4 g topically daily as needed (for pain).) 100 g 0   EQ ASPIRIN ADULT LOW DOSE 81 MG tablet TAKE 1 TABLET BY MOUTH ONCE DAILY. SWALLOW WHOLE. (Patient taking differently: Take 81 mg by mouth daily.) 90 tablet 0   escitalopram (LEXAPRO) 5 MG tablet Take 1 tablet (5 mg total) by mouth daily. 30 tablet 2   gabapentin (NEURONTIN) 100 MG capsule Take 100 mg by mouth at bedtime.     glucose blood test strip Use as instructed 100 each 12   hydrocortisone-pramoxine (PROCTOFOAM-HC) rectal foam Place 1 applicator rectally 2 (two) times daily. 10 g 1   insulin glargine (LANTUS SOLOSTAR) 100 UNIT/ML Solostar Pen Inject 15 Units into the skin at bedtime. 6 mL 4   Insulin Pen Needle (PENTIPS) 32G X 4 MM MISC Use  as directed 100 each 3   lidocaine (LIDODERM) 5 % Place 1 patch onto the skin daily. Remove & Discard patch within 12 hours or as directed by MD (Patient taking differently: Place 1 patch onto the skin daily as needed (For pain).) 30 patch 0   liraglutide (VICTOZA) 18 MG/3ML SOPN Inject 0.6 mg into the skin daily. 9 mL 4   LOKELMA 5 g packet Take 5 g by mouth daily.     multivitamin (RENA-VIT) TABS tablet Take 1 tablet by mouth daily. (Patient taking differently: Take 1 tablet by mouth See admin instructions. On Tuesday, Wednesday and Thursday)  30 tablet 0   ondansetron (ZOFRAN) 8 MG tablet Take 1 tablet (8 mg total) by mouth every 8 (eight) hours as needed for nausea or vomiting. 12 tablet 0   oxyCODONE-acetaminophen (PERCOCET) 5-325 MG tablet Take 1 tablet by mouth every 4 (four) hours as needed for severe pain. 30 tablet 0   pantoprazole (PROTONIX) 40 MG tablet Take 1 tablet (40 mg total) by mouth 2 (two) times daily. 60 tablet 2   polyethylene glycol (MIRALAX / GLYCOLAX) 17 g packet Take 17 g by mouth 2 (two) times daily. 14 each 0   senna-docusate (SENOKOT-S) 8.6-50 MG tablet Take 1 tablet by mouth at bedtime. 30 tablet 2   Ubrogepant (UBRELVY) 50 MG TABS Take 1 tablet (50 mg total) by mouth as needed (migraine headache). if needed a second dose may be taken at least 2 hours after the initial dose; MAX 8 doses in a month 8 tablet 3   No current facility-administered medications for this visit.     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    This is a 47 y.o. female who is  s/p:Left arm incision and debridement of previous surgical wound Complex skin and soft tissue closure 8 x 3 x 2cm, 10 x 4 x 2cm, 7 x 3 x 2cm   -I would wait 3 more weeks prior to accessing the left UE AV graft/fistula Sutures and staples removed.  Do not stick left UE access for 3 weeks.  May stick July 5th 2024.  F/U PRN.    Mosetta Pigeon PA-C [facility] [phone]   Clinic MD:  Karin Lieu

## 2023-01-25 ENCOUNTER — Other Ambulatory Visit: Payer: Self-pay

## 2023-01-25 ENCOUNTER — Ambulatory Visit: Payer: Medicare Other | Attending: Internal Medicine

## 2023-01-25 DIAGNOSIS — R2689 Other abnormalities of gait and mobility: Secondary | ICD-10-CM | POA: Diagnosis present

## 2023-01-25 DIAGNOSIS — Z89612 Acquired absence of left leg above knee: Secondary | ICD-10-CM | POA: Insufficient documentation

## 2023-01-25 DIAGNOSIS — Z89511 Acquired absence of right leg below knee: Secondary | ICD-10-CM | POA: Insufficient documentation

## 2023-01-25 MED ORDER — UBRELVY 50 MG PO TABS: 50.0000 mg | ORAL_TABLET | ORAL | 3 refills | Status: DC | PRN

## 2023-01-25 NOTE — Therapy (Signed)
OUTPATIENT PHYSICAL THERAPY WHEELCHAIR EVALUATION   Patient Name: Kathy Frank MRN: [id number] DOB:05-31-1976, 47 y.o., female Today's Date: 01/25/2023  END OF SESSION:  PT End of Session - 01/25/23 1834     Visit Number 1    Number of Visits 1    PT Start Time 1550    PT Stop Time 1635    PT Time Calculation (min) 45 min    Activity Tolerance Patient tolerated treatment well    Behavior During Therapy WFL for tasks assessed/performed             Past Medical History:  Diagnosis Date   Anal abscess    chronic   Anxiety    CAD (coronary artery disease)    CVA (cerebral vascular accident) (HCC)    Depression 06/28/2006   Qualifier: Diagnosis of  By: Laveda Abbe MD, Todd     Diabetes mellitus type 2 in obese 06/28/1994   Dyspnea    uses oxygeb 2 liters per minute at dialysis   End stage renal disease on dialysis [facility])    on hemodialysis T/Th/Sat   Erosive esophagitis    Esophageal reflux    Eye redness    Gastroparesis    ? diabetic   GERD (gastroesophageal reflux disease)    History of blood transfusion 2019   Human immunodeficiency virus (HIV) disease (HCC) 04/23/2016   Hyperlipidemia    Hypertension    Metabolic bone disease 05/02/2019   Moderate nonproliferative diabetic retinopathy of both eyes (HCC) 11/21/2014   11/14/14: Noted on retinal imaging; needs follow-up imaging in 6 months  05/22/16: Noted on retinal imaging again; needs follow-up imaging in 6 months   PAD (peripheral artery disease) (HCC)    Type 2 diabetes mellitus with diabetic peripheral angiopathy without gangrene (HCC) 05/01/2019   Wears dentures    lower   Wound infection s/p L transmetatarsal amputation    Past Surgical History:  Procedure Laterality Date   ABDOMINAL AORTOGRAM W/LOWER EXTREMITY N/A 12/13/2020   Procedure: ABDOMINAL AORTOGRAM W/LOWER EXTREMITY;  Surgeon: Chuck Hint, MD;  Location: [facility] INVASIVE CV LAB;  Service: Cardiovascular;  Laterality: N/A;   AMPUTATION  Left 07/08/2018   Procedure: AMPUTATION FORTH RAY LEFT FOOT;  Surgeon: Nadara Mustard, MD;  Location: [facility] OR;  Service: Orthopedics;  Laterality: Left;   AMPUTATION Left 08/09/2018   Procedure: Left Transmetatarsal Amputation;  Surgeon: Nadara Mustard, MD;  Location: [facility] OR;  Service: Orthopedics;  Laterality: Left;   AMPUTATION Left 10/08/2018   Procedure: LEFT BELOW KNEE AMPUTATION;  Surgeon: Nadara Mustard, MD;  Location: [facility] OR;  Service: Orthopedics;  Laterality: Left;   AMPUTATION Left 10/28/2018   Procedure: REVISION BELOW KNEE AMPUTATION;  Surgeon: Nadara Mustard, MD;  Location: [facility] OR;  Service: Orthopedics;  Laterality: Left;   AMPUTATION Left 12/16/2018   Procedure: LEFT ABOVE KNEE AMPUTATION;  Surgeon: Nadara Mustard, MD;  Location: [facility] OR;  Service: Orthopedics;  Laterality: Left;   AMPUTATION Right 01/31/2021   Procedure: AMPUTATION BELOW KNEE;  Surgeon: Nada Libman, MD;  Location: [facility] OR;  Service: Vascular;  Laterality: Right;   AMPUTATION Right 03/28/2021   Procedure: REVISION OF RIGHT BELOW KNEE AMPUTATION;  Surgeon: Nada Libman, MD;  Location: [facility] OR;  Service: Vascular;  Laterality: Right;   AMPUTATION Right 04/18/2021   Procedure: right below knee amputation/washout placement wound vac;  Surgeon: Leonie Douglas, MD;  Location: [facility] OR;  Service: Vascular;  Laterality: Right;   APPLICATION OF  WOUND VAC Left 12/31/2022   Procedure: APPLICATION OF PREVENA WOUND VAC;  Surgeon: Victorino Sparrow, MD;  Location: [facility] OR;  Service: Vascular;  Laterality: Left;   AV FISTULA PLACEMENT Left 10/14/2018   Procedure: Arteriovenous (Av) Fistula Creation Left Arm;  Surgeon: Cephus Shelling, MD;  Location: [facility] OR;  Service: Vascular;  Laterality: Left;   BASCILIC VEIN TRANSPOSITION Left 04/14/2019   Procedure: BASILIC VEIN TRANSPOSITION SECOND STAGE LEFT ARM;  Surgeon: Larina Earthly, MD;  Location: [facility] OR;  Service: Vascular;  Laterality: Left;   BIOPSY  10/14/2022   Procedure: BIOPSY;  Surgeon:  Benancio Deeds, MD;  Location: [facility] ENDOSCOPY;  Service: Gastroenterology;;   COMPLEX WOUND CLOSURE Left 12/31/2022   Procedure: COMPLEX WOUND CLOSURE;  Surgeon: Victorino Sparrow, MD;  Location: [facility] OR;  Service: Vascular;  Laterality: Left;   CORONARY STENT INTERVENTION N/A 05/21/2021   Procedure: CORONARY STENT INTERVENTION;  Surgeon: Marykay Lex, MD;  Location: [facility] INVASIVE CV LAB;  Service: Cardiovascular;  Laterality: N/A;   ESOPHAGOGASTRODUODENOSCOPY (EGD) WITH PROPOFOL N/A 10/14/2022   Procedure: ESOPHAGOGASTRODUODENOSCOPY (EGD) WITH PROPOFOL;  Surgeon: Benancio Deeds, MD;  Location: [facility] ENDOSCOPY;  Service: Gastroenterology;  Laterality: N/A;   HEMOSTASIS CLIP PLACEMENT  10/14/2022   Procedure: HEMOSTASIS CLIP PLACEMENT;  Surgeon: Benancio Deeds, MD;  Location: [facility] ENDOSCOPY;  Service: Gastroenterology;;   INCISION AND DRAINAGE Left 12/31/2022   Procedure: INCISION AND DRAINAGE OF LEFT ARM;  Surgeon: Victorino Sparrow, MD;  Location: [facility] OR;  Service: Vascular;  Laterality: Left;   INCISION AND DRAINAGE ABSCESS N/A 10/24/2020   Procedure: exicision of hydradenitis;  Surgeon: Romie Levee, MD;  Location: [facility];  Service: General;  Laterality: N/A;  45 min   INSERTION OF DIALYSIS CATHETER Right 04/14/2019   Procedure: INSERTION OF DIALYSIS CATHETER;  Surgeon: Larina Earthly, MD;  Location: [facility] OR;  Service: Vascular;  Laterality: Right;   IR FLUORO GUIDE CV LINE RIGHT  10/13/2022   IR REMOVAL TUN CV CATH W/O FL  10/16/2022   IR US GUIDE VASC ACCESS RIGHT  10/13/2022   LEFT HEART CATH AND CORONARY ANGIOGRAPHY N/A 05/21/2021   Procedure: LEFT HEART CATH AND CORONARY ANGIOGRAPHY;  Surgeon: Marykay Lex, MD;  Location: [facility] INVASIVE CV LAB;  Service: Cardiovascular;  Laterality: N/A;   LOWER EXTREMITY ANGIOGRAPHY N/A 07/05/2018   Procedure: LOWER EXTREMITY ANGIOGRAPHY;  Surgeon: Nada Libman, MD;  Location: [facility] INVASIVE CV LAB;  Service: Cardiovascular;  Laterality:  N/A;   PERIPHERAL VASCULAR ATHERECTOMY Right 12/13/2020   Procedure: PERIPHERAL VASCULAR ATHERECTOMY;  Surgeon: Chuck Hint, MD;  Location: [facility] INVASIVE CV LAB;  Service: Cardiovascular;  Laterality: Right;  Superficial femoral   PERIPHERAL VASCULAR BALLOON ANGIOPLASTY Left 07/05/2018   Procedure: PERIPHERAL VASCULAR BALLOON ANGIOPLASTY;  Surgeon: Nada Libman, MD;  Location: [facility] INVASIVE CV LAB;  Service: Cardiovascular;  Laterality: Left;  SFA   PERIPHERAL VASCULAR BALLOON ANGIOPLASTY Right 12/13/2020   Procedure: PERIPHERAL VASCULAR BALLOON ANGIOPLASTY;  Surgeon: Chuck Hint, MD;  Location: [facility] INVASIVE CV LAB;  Service: Cardiovascular;  Laterality: Right;  Peroneal artery, anterior tibial artery   RECTAL EXAM UNDER ANESTHESIA N/A 10/24/2020   Procedure: EXAM UNDER ANESTHESIA;  Surgeon: Romie Levee, MD;  Location: [facility];  Service: General;  Laterality: N/A;   STUMP REVISION Left 10/19/2018   Procedure: REVISION LEFT BELOW KNEE AMPUTATION;  Surgeon: Nadara Mustard, MD;  Location: [facility] OR;  Service: Orthopedics;  Laterality: Left;  TUBAL LIGATION  2002   Patient Active Problem List   Diagnosis Date Noted   End stage renal disease on dialysis (HCC) 12/25/2022   A-V fistula (HCC) 12/23/2022   Dependence on wheelchair 11/13/2022   Blister 10/29/2022   Anemia, posthemorrhagic, acute 10/14/2022   High anion gap metabolic acidosis 10/13/2022   Acute gastric ulcer with hemorrhage 10/13/2022   Hyperkalemia 10/12/2022   Right-sided chest pain 10/01/2022   CAP (community acquired pneumonia) 09/29/2022   Homelessness 08/26/2022   Migraine with aura 06/29/2022   Community acquired pneumonia 06/25/2022   Hypertensive emergency 11/09/2021   Requires assistance with activities of daily living (ADL) 10/27/2021   Analgesic overuse headache 06/28/2021   Urinary and fecal incontinence 05/30/2021   Epistaxis 05/27/2021   Status post coronary artery stent  placement    Coronary artery disease involving native coronary artery of native heart with unstable angina pectoris (HCC)    Hypertensive urgency 05/20/2021   History of non-ST elevation myocardial infarction (NSTEMI) 05/20/2021   BKA stump complication (HCC) 04/18/2021   Right below-knee amputee (HCC) 02/08/2021   Peripheral arterial disease (HCC) 01/29/2021   Anxiety 05/28/2020   Patient's other noncompliance with medication regimen 12/28/2019   Major depressive disorder, single episode, unspecified 04/24/2019   Secondary hyperparathyroidism of renal origin (HCC) 04/24/2019   Abdominal pain 02/06/2019   S/P AKA (above knee amputation), left (HCC) 12/16/2018   Anemia due to chronic kidney disease    Moderate protein-calorie malnutrition (HCC)    Adjustment disorder with mixed anxiety and depressed mood 08/15/2018   ESRD on dialysis (HCC)    Fecal incontinence 02/04/2018   Aortic atherosclerosis (HCC) 09/30/2016   Human immunodeficiency virus (HIV) disease (HCC) 04/23/2016   Gastroparesis    Moderate nonproliferative diabetic retinopathy of both eyes (HCC) 11/21/2014   Esophageal reflux    Essential hypertension 11/16/2013   Anemia 11/15/2013   Depression 06/28/2006   Diabetes mellitus type 2 in obese 06/28/1994    PCP: Quincy Simmonds  REFERRING PROVIDER: Inez Catalina, MD  THERAPY DIAG:  Other abnormalities of gait and mobility  History of above-knee amputation of left lower extremity (HCC)  Hx of BKA, right (HCC)  Rationale for Evaluation and Treatment Rehabilitation  SUBJECTIVE:                                                                                                                                                                                           SUBJECTIVE STATEMENT: Pt present or wheelchair evaluation. Pt had her L LE AKA 2020 and R LE BKA 2021. Pt has bil prosthetic legs but has not worn them in >1  month. Pt has not worn her L prosthetic leg for >2  years because it feels to heavy for her and she tends to loose balance with it on. Pt recently fell about 1 month ago. Pt performs forward transfers from wheelchair. Pt fell a month ago while trying to do the transfer as her current manual wheelchair doesn't have a flip back arm rest. Pt lives with her two daughters. Pt lives in an ground floor apartment. Pt has all hardwood floors inside the house. Pt currently is not able to get normal wheelchair in the bathroom so she has to slide from wheelchair to shower bench placed near the entry way and use the shower chair as slide board to get to her toilet. Pt also has bed side commode but prefers to use normal bathroom.  PRECAUTIONS: None  WEIGHT BEARING RESTRICTIONS No    OCCUPATION: on diability  PLOF:  Needs assistance with ADLs, Needs assistance with homemaking, Needs assistance with gait, and Needs assistance with transfers  PATIENT GOALS: Improve mobility         MEDICAL HISTORY:  Primary diagnosis onset: 11/13/2022 (date of referral) Diagnosis  Code: Z89.612 (ICD-10-CM) - S/P AKA (above knee amputation), left (HCC) Diagnosis:    Diagnosis code:       Diagnosis:  T87.9 (ICD-10-CM) - BKA stump complication (HCC)  Diagnosis  Code:  Diagnosis:   [] Progressive disease  Relevant future surgeries:     Height: 5\' 5"  Weight: 170 lbs Explain recent changes or trends in weight:      History:  Past Medical History:  Diagnosis Date   Anal abscess    chronic   Anxiety    CAD (coronary artery disease)    CVA (cerebral vascular accident) (HCC)    Depression 06/28/2006   Qualifier: Diagnosis of  By: Laveda Abbe MD, Todd     Diabetes mellitus type 2 in obese 06/28/1994   Dyspnea    uses oxygeb 2 liters per minute at dialysis   End stage renal disease on dialysis [facility])    on hemodialysis T/Th/Sat   Erosive esophagitis    Esophageal reflux    Eye redness    Gastroparesis    ? diabetic   GERD (gastroesophageal reflux disease)    History of  blood transfusion 2019   Human immunodeficiency virus (HIV) disease (HCC) 04/23/2016   Hyperlipidemia    Hypertension    Metabolic bone disease 05/02/2019   Moderate nonproliferative diabetic retinopathy of both eyes (HCC) 11/21/2014   11/14/14: Noted on retinal imaging; needs follow-up imaging in 6 months  05/22/16: Noted on retinal imaging again; needs follow-up imaging in 6 months   PAD (peripheral artery disease) (HCC)    Type 2 diabetes mellitus with diabetic peripheral angiopathy without gangrene (HCC) 05/01/2019   Wears dentures    lower   Wound infection s/p L transmetatarsal amputation             Cardio Status:  Functional Limitations:   [] Intact  [x]  Impaired    stent  Respiratory Status:  Functional Limitations:   [] Intact  [] Impaired   [x] SOB [] COPD [] O2 Dependent ______LPM  [] Ventilator Dependent  Resp equip:                                                     Objective Measure(s):   Orthotics:   [  x]Amputee:                                                             [x] Prosthesis: L AKA, R BKA       HOME ENVIRONMENT:  [] House [] Condo/town home [x] Apartment [] Asst living [] LTCF         [] Own  [x] Rent   [] Lives alone [x] Lives with others -       2 daughters                      Hours without assistance: 8-10 hours  [x] Home is accessible to patient                                 Storage of wheelchair:  [x] In home   [] Other Comments:        COMMUNITY :  TRANSPORTATION:  [] Car [] Firefighter [] Adapted w/c Lift []  Ambulance [] Other:                     [x] Sits in wheelchair during transport   Where is w/c stored during transport?  [x] [employer]  []  [employer]  r   [] Self-Driver       Drive while in  Biomedical scientist [] yes [x] no   Employment and/or school:  Specific requirements pertaining to mobility        Other:  COMMUNICATION:  Verbal Communication  [x] WFL [] receptive [] WFL [] expressive [] Understandable  [] Difficult to understand  [] non-communicative   Primary Language:_____English_________ 2nd:_____________  Communication provided by:[x] Patient [] Family [] Caregiver [] Translator   [] Uses an augmentative communication device     Manufacturer/Model :                                                                MOBILITY/BALANCE:  Sitting Balance  Standing Balance  Transfers  Ambulation   [x] WFL      [] WFL  [] Independent  []  Independent   [] Uses UE for balance in sitting Comments:  [] Uses UE/device for stability Comments:  [x]  Min assist  []  Ambulates independently with       device:___________________      []  Mod assist  []  Able to ambulate ______ feet        safely/functionally/independently   []  Min assist  []  Min assist  []  Max assist  [x]  Non-functional ambulator         History/High risk of falls   []  Mod assist  []  Mod assist  []  Dependent  []  Unable to ambulate   []  Max  assist  []  Max assist  Transfer method:[] 1 person [] 2 person [x] sliding board [] squat pivot [] stand pivot [] mechanical patient lift  [x] other: Forward transfers from wheelchair to other sitting surface.  []  Unable  [x]  Unable    Fall History: # of falls in the past 6 months? 1 # of "near" falls in the past 6 months? 1    CURRENT SEATING / MOBILITY:  Current Mobility Device: [] None [] Cane/Walker [x] Manual [] Dependent [] Dependent w/ Tilt rScooter  [] Power (type of  control):   Manufacturer: [employer] Model:  Serial #:   Size:  Color:  Age:   Purchased by whom:   Current condition of mobility base:    Current seating system:                                                                       Age of seating system:  1 month  Describe posture in present seating system:    Is the current mobility meeting medical necessity?:  [] Yes [x] No Describe: Pt is using manual wheelchair without swing away arms, difficult for patient to propel manual wheelchair for longer distances. Pt has fistula in L UE for kidney dialysis which she goes to 3 days a week and after dialysis  she is very fatigued and is unable to use manual wheelchair on days of dialysis.                                     Ability to complete Mobility-Related Activities of Daily Living (MRADL's) with Current Mobility Device:   Move room to room  [x] Independent  [] Min [] Mod [] Max assist  [] Unable  Comments:   Meal prep  [] Independent  [x] Min [] Mod [] Max assist  [] Unable    Feeding  [x] Independent  [] Min [] Mod [] Max assist  [] Unable    Bathing  [x] Independent  [] Min [] Mod [] Max assist  [] Unable    Grooming  [x] Independent  [] Min [] Mod [] Max assist  [] Unable    UE dressing  [x] Independent  [] Min [] Mod [] Max assist  [] Unable    LE dressing  [x] Independent   [] Min [] Mod [] Max assist  [] Unable    Toileting  [x] Independent  [] Min [] Mod [] Max assist  [] Unable    Bowel Mgt: [x]  Continent []  Incontinent []  Accidents []  Diapers []  Colostomy []  Bowel Program:  Bladder Mgt: [x]  Continent []  Incontinent []  Accidents []  Diapers []  Urinal []  Intermittent Cath []  Indwelling Cath []  Supra-pubic Cath     Current Mobility Equipment Trialed/ Ruled Out:    Does not meet mobility needs due to:    Mark all boxes that indicate inability to use the specific equipment listed     Meets needs for safe  independent functional  ambulation  / mobility    Risk of  Falling or History of Falls    Enviromental limitations      Cognition    Safety concerns with  physical ability    Decreased / limitations endurance  & strength     Decreased / limitations  motor skills  & coordination    Pain    Pace /  Speed    Cardiac and/or  respiratory condition    Contra - indicated by diagnosis   Cane/Crutches  []   [x]   []   []   [x]   [x]   [x]   [x]   []   []   [x]    Walker / Rollator  []  NA   []   []   []   []   [x]   [x]   [x]   [x]   [x]   []   []     Manual Wheelchair [id number]:  []  NA  []   []   []   []   []   [x]   []   []   [  x]  [x]   []    Manual W/C (K0005) with power assist  []  NA  []   []   []   []   []   []   []   []   []   []   []    Scooter   []  NA  []   []   []   []   []   []   []   []   []   []   []    Power Wheelchair: standard joystick  []  NA  []   []   []   []   []   []   []   []   []   []   []    Power Wheelchair: alternative controls  []  NA  []   []   []   []   []   []   []   []   []   []   []    Summary:  The least costly alternative for independent functional mobility was found to be:    []  Crutch/Cane  []  Walker []  Manual w/c  []  Manual w/c with power assist   []  Scooter   [x]  Power w/c std joystick   []  Power w/c alternative control        []  Requires dependent care mobility device   Cabin crew for [employer] skills are adequate for safe mobility equipment operation  [x]   Yes []   No  Patient is willing and motivated to use recommended mobility equipment  [x]   Yes []   No       []  Patient is unable to safely operate mobility equipment independently and requires dependent care equipment Comments:           SENSATION and SKIN ISSUES:  Sensation [x]  Intact  []  Impaired []  Absent []  Hyposensate []  Hypersensate  []  Defensiveness  Location(s) of impairment:    Pressure Relief Method(s):  [x]  Lean side to side to offload (without risk of falling)  [x]   W/C push up (4+ times/hour for 15+ seconds) []  Stand up (without risk of falling)    []  Other: (Describe): Effective pressure relief method(s) above can be performed consistently throughout the day: rYes  r No If not, Why?:  Skin Integrity Risk:       [x]  Low risk           []  Moderate risk            []  High risk  If high risk, explain:   Skin Issues/Skin Integrity  Current skin Issues  []  Yes [x]  No [x]  Intact  []   Red area   []   Open area  []  Scar tissue  []  At risk from prolonged sitting  Where: History of Skin Issues  []  Yes [x]  No Where : When: Stage: Hx of skin flap surgeries  []  Yes [x]  No Where:  When:  Pain: [x]  Yes []  No   Pain Location(s): bil residual legs Intensity scale: (0-10) : 6-8/10, pt reports wearing prosthetics causes more  pain. How does pain interfere with mobility and/or MRADLs? - unable to wear prosthesis for prolonged time due to pain.        MAT EVALUATION:  Neuro-Muscular Status: (Tone, Reflexive, Responses, etc.)     [x]   Intact   []  Spasticity:  []  Hypotonicity  []  Fluctuating  [x]  Muscle Spasms  []  Poor Righting Reactions/Poor Equilibrium Reactions  []  Primal Reflex(s):    Comments:            COMMENTS:    POSTURE:     Comments:  Pelvis Anterior/Posterior:  [x]  Neutral   []  Posterior  []  Anterior  []  Fixed -  No movement []  Tendency away from neutral []  Flexible []  Self-correction []  External correction Obliquity (viewed from front)  [x]  WFL []  R Obliquity []  L Obliquity  []  Fixed - No movement []  Tendency away from neutral []  Flexible []  Self-correction []  External correction Rotation  [x]  WFL []  R anterior []  L anterior  []  Fixed - No movement []  Tendency away from neutral []  Flexible []  Self-correction []  External correction Tonal Influence Pelvis:  [x]  Normal []  Flaccid []  Low tone []  Spasticity []  Dystonia []  Pelvis thrust []  Other:    Trunk Anterior/Posterior:  [x]  WFL []  Thoracic kyphosis []  Lumbar lordosis  []  Fixed - No movement []  Tendency away from neutral []  Flexible []  Self-correction []  External correction  [x]  WFL []  Convex to left  []  Convex to right []  S-curve   []  C-curve []  Multiple curves []  Tendency away from neutral []  Flexible []  Self-correction []  External correction Rotation of shoulders and upper trunk:  [x]  Neutral []  Left-anterior []  Right- anterior []  Fixed- no movement []  Tendency away from neutral []  Flexible []  Self correction []  External correction Tonal influence Trunk:  [x]  Normal []  Flaccid []  Low tone []  Spasticity []  Dystonia []  Other:   Head & Neck  [x]  Functional []  Flexed    []  Extended []  Rotated right  []  Rotated left []  Laterally flexed right []  Laterally flexed left []  Cervical  hyperextension   [x]  Good head control []  Adequate head control []  Limited head control []  Absent head control Describe tone/movement of head and neck:      Lower Extremity Measurements: LE ROM:  Active ROM Right 01/25/2023 Left 01/25/2023  Hip flexion    Hip extension    Hip abduction    Hip adduction    Knee flexion    Knee extension    Ankle dorsiflexion    Ankle plantarflexion     (Blank rows = not tested)  LE MMT:  MMT Right 01/25/2023 Left 01/25/2023  Hip flexion    Hip extension    Hip abduction    Hip adduction    Knee flexion    Knee extension    Ankle dorsiflexion    Ankle plantarflexion     (Blank rows = not tested)  Hip positions:  [x]  Neutral   []  Abducted   []  Adducted  []  Subluxed   []  Dislocated   []  Fixed   []  Tendency away from neutral [x]  Flexible [x]  Self-correction []  External correction   Hip Windswept:[x]  Neutral  []  Right    []  Left  []  Subluxed   []  Dislocated   []  Fixed   []  Tendency away from neutral [x]  Flexible [x]  Self-correction []  External correction  LE Tone: [x]  Normal []  Low tone []  Spasticity []  Flaccid []  Dystonia []  Rocks/Extends at hip []  Thrust into knee extension []  Pushes legs downward into footrest  Foot positioning: ROM Concerns: Dorsiflexed: []  Right   []  Left Plantar flexed: []  Right    []  Left Inversion: []  Right    []  Left Eversion: []  Right    []  Left  LE Edema: [x]  1+ (Barely detectable impression when finger is pressed into skin) []  2+ (slight indentation. 15 seconds to rebound) []  3+ (deeper indentation. 30 seconds to rebound) []  4+ (>30 seconds to rebound)  UE Measurements:  UPPER EXTREMITY ROM: WNL with general screening  Active ROM Right 01/25/2023 Left 01/25/2023  Shoulder flexion    Shoulder abduction    Shoulder adduction    Elbow flexion  Elbow extension    Wrist flexion    Wrist extension    (Blank rows = not tested)  UPPER EXTREMITY MMT:  MMT Right 01/25/2023  Left 01/25/2023  Shoulder flexion    Shoulder abduction    Shoulder adduction    Elbow flexion    Elbow extension    Wrist flexion    Wrist extension    Pinch strength    Grip strength 45 lbs 43 lbs  (Blank rows = not tested)  Shoulder Posture:  Right Tendency towards Left  []   Functional []    [x]   Elevation [x]    []   Depression []    []   Protraction []    []   Retraction []    []   Internal rotation []    []   External rotation []    []   Subluxed []     UE Tone: [x]  Normal []  Flaccid []  Low tone []  Spasticity  []  Dystonia []  Other:   UE Edema: [x]  1+ (Barely detectable impression when finger is pressed into skin) []  2+ (slight indentation. 15 seconds to rebound) []  3+ (deeper indentation. 30 seconds to rebound) []  4+ (>30 seconds to rebound)  Wrist/Hand: Handedness: [x]  Right   []  Left   []  NA: Comments:  Right  Left  [x]   WNL [x]    []   Limitations []    []   Contractures []    []   Fisting []    []   Tremors []    []   Weak grasp []    []   Poor dexterity []    []   Hand movement non functional []    []   Paralysis []         MOBILITY BASE RECOMMENDATIONS and JUSTIFICATION:  MOBILITY BASE  JUSTIFICATION   Manufacturer:   Games developer:         [employer]                     Color:  Seat Width:  18" Seat Depth 20"   []  Manual mobility base (continue below)   []  Scooter/POV  [x]  Power mobility base   Number of hours per day spent in above selected mobility base: 16+  Typical daily mobility base use Schedule: during the day   [x]  is not a safe, functional ambulator  [x]  limitation prevents from completing a MRADL(s) within a reasonable time frame    [x]  limitation places at high risk of morbidity or mortality secondary to  the attempts to perform a    MRADL(s)  [x]  limitation prevents accomplishing a MRADL(s) entirely  [x]  provide independent mobility  [x]  equipment is a lifetime medical need  [x]  walker or cane inadequate  [x]  any type manual wheelchair      inadequate   []  scooter/POV inadequate      []  requires dependent mobility          MANUAL MOBILITY      []  Standard manual wheelchair  K0001      Arm:    []  both []  right  []  left      Foot:   []  both []  right   []  left  []  self-propels wheelchair  []  will use on regular basis  []  chair fits throughout home  []  willing and motivated to use  []  propels with assistance     []  dependent use   []  Standard hemi-manual wheelchair  K0002      Arm:    []  both []  right  []  left      Foot:   []  both []   right   []  left  []  lower seat height required to foot propel  []  short stature  []  self-propels wheelchair  []  will use on regular basis  []  chair fits throughout home  []  willing and motivated to use   []  propels with assistance  []  dependent use   []  Lightweight manual wheelchair  K0003      Arm:    []  both []  right  []  left      Foot:   []  both  []  right  []  left                   []  hemi height required  []  medical condition and weight of  wheelchair affect ability to self      propel standard manual wheelchair in the residence  []  can and does self-propel (marginal propulsion skills)  []  daily use _________hours  []  chair fits throughout home  []  willing and motivated to use  []  lower seat height required to foot propel  []  short stature   []  High strength lightweight manual  wheelchair (Breezy Ultra 4)  K0004     Arm:    []  both []  right  []  left     Foot:   []  both []  right   []  left                                                                  []  hemi height required []  medical condition and weight of wheelchair affect ability to self propel while engaging in frequent MRADL(s) that cannot be performed in a standard or lightweight manual wheelchair  []  daily use _________hours  []  chair fits throughout home  []  willing and motivated to use  []  prevent repetitive use injuries   []  lower seat height required to foot propel  []  short stature    []  Ultra-lightweight manual  wheelchair  K0005     Arm:    []  both []  right  []  left     Foot:   []  both []  right  []  left       []  hemi height required  []  heavy duty    Front seat to floor _____ inches      Rear seat to floor _____ inches      Back height _____ inches     Back angle ______ degrees      Front angle _____ degrees  []   full-time manual wheelchair user  []  Requires individualized fitting and optimal adjustments for multiple features that include adjustable axle configuration, fully adjustable center of gravity, wheel camber, seat and back angle, angle of seat slope, which cannot be accommodated by a K0001 through K0004 manual wheelchair  []  prevent repetitive use injuries  []  daily use_________hours   []  user has high activity patterns that frequently require  them  to go out into the community for the purpose of independently accomplishing high level MRADL activities. Examples of these might include a combination of; shopping, work, school, Photographer, childcare, independently loading and unloading from a vehicle etc.  []  lower seat height required to foot propel  []  short stature  []  heavy duty -  weight over 250lbs   []  Current chair is a K0005   manufacture:___________________  model:_________________  serial#____________________  age:_________    []  First time O5366 user (complete trial)  K0004 time and # of strokes to propel 30 feet: ________seconds _________strokes  Y4034 time and # of strokes to propel 30 feet: ________seconds _________strokes  What was the result of the trial between the K0004 and K0005 manual wheelchair? ___    What features of the K0005 w/c are needed as compared to the K0004 base? Why?___    []  adjustable seat and back angle changes the angle of seat slope of the frame to attain a gravity assisted position for efficient propulsion and proper weight distribution along the frame     []  the front of the wheelchair will be configured higher than the back of the chair  to allow gravity to assist the user with postural stability  []  the center of the wheel will be positioned for stability, safety and efficient propulsion  []  adjustable axle allows for vertical, horizontal, camber and overall width changes  throughout the wheels for adjustment of the client's exact needs and abilities.   []  adjustable axle increases the stability and function of the chair allowing for adjustment of the center of gravity.   []  accommodates the client's anatomical position in the chair maximizing independence in mobility and maneuverability in all environments.   []  create a minimal fixed tilt-in space to assist in positioning.   []  Describe users full-time manual wheelchair activity patterns:___    []  Power assist Comments:  []  prevent repetitive use injuries  []  repetitive strain injury present in    shoulder girdle    []  shoulder pain is (> or =) to 7/10     during manual propulsion       Current Pain _____/10  []  requires conservation of energy to participate in MRADL(s) runable to propel up ramps or curbs using manual wheelchair  []  been K0005 user greater than one year  []  user unwilling to use power      wheelchair (reason): []  less expensive option to power   wheelchair   []  rim activated power assist -      decreased strength   []  Heavy duty manual wheelchair       K0006     Arm:    []  both []  right  []  left     Foot:   []  both []  right  []  left     []  hemi height required    []  Dependent base  []  user exceeds 250lbs  []  non-functional ambulator    []  extreme spasticity  []  over active movement   []  broken frame/hx of repeated     repairs  []  able to self-propel in residence       []  lower seat to floor height required  []  unable to self-propel in residence   []  Extra heavy duty manual wheelchair  K0007     Arm:    []  both []  right  []  left     Foot:   []  both []  right  []  left     []  hemi height required  []  Dependent base  []  user exceeds 300lbs  []   non-functional ambulator    []  able to self-propel in residence   []  lower seat to floor height required  []  unable to self-propel in residence     []  Manual wheelchair with tilt [phone]      (Manual "Tilt-n-Space")  []  patient is dependent for transfers  []  patient requires frequent  positioning for pressure relief   []  patient requires frequent      positioning for poor/absent trunk control        []  Stroller Base  []  infant/child   []  unable to propel manual      wheelchair  []  allows for growth  []  non-functional ambulator  []  non-functional UE  []  independent mobility is not a goal at this time    MANUAL FRAME OPTIONS      Push handles  []  extended  rangle adjustable   []  standard  []  caregiver access  []  caregiver assist    []  allows "hooking" to enable      increased ability to perform ADLs or maintain balance   []  Angle Adjustable Back  []  postural control  []  control of tone/spasticity  []  accommodation of range of motion  []  UE functional control  []  accommodation for seating system    Rear wheel placement  []  std/fixed rfully adjustableramputee   []  camber ________degree  []  removable rear wheel  []  non-removable rear wheel  Wheel size _______  Wheel style_______________________  []  improved UE access to wheels  []  increase propulsion ability  []  improved stability  []  changing angle in space for      improvement of postural stability  []  remove for transport    []  allow for seating system to fit on      base  []  amputee placement  []  1-arm drive access   r R  r L  []  enable propulsion of manual       wheelchair with one arm    []  amputee placement   Wheel rims/ Hand rims  []  Standard    []  Specialized-____ []  provide ability to propel manual   []  increase self-propulsion with hand wheelchair weakness/decreased grasp     []  Spoke protector/guard   []  prevent hands from getting caught in spokes   Tires:  []  pneumatic  []  flat free inserts  []  solid   Style:  []  decrease roll resistance              []  prevent frequent flats  []  increase shock absorbency  []  decrease maintenance   []  decrease pain from road shock    []  decrease spasms from road shock    Wheel Locks:    []  push []  pull []  scissor  []  lock wheels for transfers  []  lock wheels from rolling   Brake/wheel lock extension:  []  R  []  L  []  allow user to operate wheel locks due to decreased reach or strength   Caster housing:  Caster size:                      Style:                                          []  suspension fork  []  maneuverability   []  stability of wheelchair   []  durability  []  maintenance  []  angle adjustment for posture  []  allow for feet to come under        wheelchair base  []  allows change in seat to floor      height   []  increase shock absorbency  []  decrease pain from road shock  []  decrease spasms from road    shock   []  Side guards  []  prevent  clothing getting caught in wheel or becoming soiled  rprovide hip and pelvic stability  []  eliminates contact between body and wheels  []  limit hand contact with wheels   []  Anti-tippers      []  prevent wheelchair from tipping    backward  []  assist caregiver with curbs     POWER MOBILITY      []  Scooter/POV    []  can safely operate   []  can safely transfer   []  has adequate trunk stability   []  cannot functionally propel  manual wheelchair    [x]  Power mobility base    [x]  non-ambulatory   []  cannot functionally propel manual wheelchair   [x]  cannot functionally and safely      operate scooter/POV  [x]  can safely operate power       wheelchair  [x]  home is accessible  [x]  willing to use power wheelchair     Tilt  []  Powered tilt on powered chair  []  Powered tilt on manual chair  []  Manual tilt on manual chair Comments:  []  change position for pressure      []  elief/cannot weight shift   []  change position against      gravitational force on head and      shoulders   []  decrease pain  []   blood pressure management   []  control autonomic dysreflexia  []  decrease respiratory distress  []  management of spasticity  []  management of low tone  []  facilitate postural control   []  rest periods   []  control edema  []  increase sitting tolerance   []  aid with transfers     Recline   []  Power recline on power chair  []  Manual recline on manual chair  Comments:    []  intermittent catheterization  []  manage spasticity  []  accommodate femur to back angle  []  change position for pressure relief/cannot weight shift rhigh risk of pressure sore development  []  tilt alone does not accomplish     effective pressure relief, maximum pressure relief achieved at -      _______ degrees tilt   _______ degrees recline   []  difficult to transfer to and from bed []  rest periods and sleeping in chair  []  repositioning for transfers  []  bring to full recline for ADL care  []  clothing/diaper changes in chair  []  gravity PEG tube feeding  []  head positioning  []  decrease pain  []  blood pressure management   []  control autonomic dysreflexia  []  decrease respiratory distress  []  user on ventilator     Elevator on mobility base  []  Power wheelchair  []  Scooter  []  increase Indep in transfers   []  increase Indep in ADLs    []  bathroom function and safety  []  kitchen/cooking function and safety  []  shopping  []  raise height for communication at standing level  []  raise height for eye contact which reduces cervical neck strain and pain  []  drive at raised height for safety and navigating crowds  []  Other:   []  Vertical position system  (anterior tilt)     (Drive locks-out)    []  Stand       (Drive enabled)  []  independent weight bearing  []  decrease joint contractures  []  decrease/manage spasticity  []  decrease/manage spasms  []  pressure distribution away from   scapula, sacrum, coccyx, and ischial tuberosity  []  increase digestion and elimination   []  access to counters and cabinets   []  increase reach  []  increase  interaction with others at eye level, reduces neck strain  []  increase performance of       MRADL(s)      Power elevating legrest    []  Center mount (Single) 85-170 degrees       []  Standard (Pair) 100-170 degrees  []  position legs at 90 degrees, not available with std power ELR  []  center mount tucks into chair to decrease turning radius in home, not available with std power ELR  []  provide change in position for LE  []  elevate legs during recline    []  maintain placement of feet on      footplate  []  decrease edema  []  improve circulation  []  actuator needed to elevate legrest  []  actuator needed to articulate legrest preventing knees from flexing  []  Increase ground clearance over      curbs  []   STD (pair) independently                     elevate legrest   POWER WHEELCHAIR CONTROLS      Controls/input device  []  Expandable  [x]  Non-expandable  []  Proportional  [x]  Right Hand []  Left Hand  []  Non-proportional/switches/head-array  []  Electrical/proximity         []   Mechanical      Manufacturer:___________________   Type:________________________ [x]  provides access for controlling wheelchair  [x]  programming for accurate control  []  progressive disease/changing condition  []  required for alternative drive      controls       []  lacks motor control to operate  proportional drive control  []  unable to understand proportional controls  []  limited movement/strength  []  extraneous movement / tremors / ataxic / spastic       []  Upgraded electronics controller/harness    []  Single power (tilt or recline)   []  Expandable    []  Non-expandable plus   []  Multi-power (tilt, recline, power legrest, power seat lift, vertical positioning system, stand)  []  allows input device to communicate with drive motors  []  harness provides necessary connections between the controller, input device, and seat functions     []  needed in order to operate power  seat functions through joystick/ input device  []  required for alternative drive controls     []  Enhanced display  []  required to connect all alternative drive controls   []  required for upgraded joystick      (lite-throw, heavy duty, micro)  []  Allows user to see in which mode and drive the wheelchair is set; necessary for alternate controls       []  Upgraded tracking electronics  []  correct tracking when on uneven surfaces makes switch driving more efficient and less fatiguing  []  increase safety when driving  []  increase ability to traverse thresholds    []  Safety / reset / mode switches     Type:    []  Used to change modes and stop the wheelchair when driving     [x]  Mount for joystick / input device/switches  [x]  swing away for access or transfers   []  attaches joystick / input device / switches to wheelchair   [x]  provides for consistent access  []  midline for optimal placement    []  Attendant controlled joystick plus     mount  []  safety  []  long distance driving  []  operation of seat functions  []  compliance with transportation regulations    [x]  Battery U1 x 2 [x]  required to power (power assist /  scooter/ power wc / other):   []  Power inverter (24V to 12V)  []  required for ventilator / respiratory equipment / other:     CHAIR OPTIONS MANUAL & POWER      Armrests   [x]  adjustable height []  removable  []  swing away []  fixed  [x]  flip back  []  reclining  [x]  full length pads []  desk []  tube arms []  gel pads  [x]  provide support with elbow at 90    [x]  remove/flip back/swing away for  transfers  [x]  provide support and positioning of upper body    [x]  allow to come closer to table top  [x]  remove for access to tables  []  provide support for w/c tray  [x]  change of height/angles for       variable activities   []  Elbow support / Elbow stop  []  keep elbow positioned on arm pad  []  keep arms from falling off arm pad  during tilt and/or recline   Upper Extremity Support  []  Arm  trough  []   R  []   L  Style:  []  swivel mount []  fixed mount   []  posterior hand support  []   tray  []  full tray  []  joystick cut out  []   R  []   L  Style:  []  decrease gravitational pull on      shoulders  []  provide support to increase UE  function  []  provide hand support in natural    position  []  position flaccid UE  []  decrease subluxation    []  decrease edema       []  manage spasticity   []  provide midline positioning  []  provide work surface  []  placement for AAC/ Computer/ EADL       Hangers/ Legrests   []  ______ degree  []  Elevating []  articulating  []  swing away []  fixed []  lift off  []  heavy duty []  adjustable knee angle  []  adjustable calf panel   []  longer extension tube              []  provide LE support  []  maintain placement of feet on      footplate   []  accommodate lower leg length  []  accommodate to hamstring       tightness  []  enable transfers  []  provide change in position for LE's  []  elevate legs during recline    []  decrease edema  []  durability      Foot support   [x]  footplate []  R []  L [x]  flip up           []  Depth adjustable   []  angle adjustable  []  foot board/one piece    [x]  provide foot support  []  accommodate to ankle ROM  [x]  allow foot to go under wheelchair base  [x]  enable transfers     []  Shoe holders  []  position foot    []  decrease / manage spasticity  []  control position of LE  []  stability    []  safety     []  Ankle strap/heel      loops  []  support foot on foot support  []  decrease extraneous movement  []  provide input to heel   []  protect foot     [x]  Amputee adapter [x]  R  []  L     Style:                  Size:  [x]  Provide support for stump/residual extremity    []   Transportation tie-down  []  to provide crash tested tie-down brackets    []  Crutch/cane holder    []  O2 holder    []  IV hanger   []  Ventilator tray/mount    []  stabilize accessory on wheelchair       Component  Justification     [x]  Seat cushion     Captain seat   []  accommodate impaired sensation  []  decubitus ulcers present or history  []  unable to shift weight  [x]  increase pressure distribution  []  prevent pelvic extension  []  custom required "off-the-shelf"    seat cushion will not accommodate deformity  [x]  stabilize/promote pelvis alignment  [x]  stabilize/promote femur alignment  []  accommodate obliquity  []  accommodate multiple deformity  []  incontinent/accidents  [x]  low maintenance     []  seat mounts                 []  fixed []  removable  []  attach seat platform/cushion to wheelchair frame    []  Seat wedge    []  provide increased aggressiveness of seat shape to decrease sliding  down in the seat  []  accommodate ROM        []  Cover replacement   []  protect back or seat cushion  []  incontinent/accidents    []  Solid seat / insert    []  support cushion to prevent      hammocking  []  allows attachment of cushion to mobility base    []  Lateral pelvic/thigh/hip     support (Guides)     []  decrease abduction  []  accommodate pelvis  []  position upper legs  []  accommodate spasticity  []  removable for transfers     []  Lateral pelvic/thigh      supports mounts  []  fixed   []  swing-away   []  removable  []  mounts lateral pelvic/thigh supports     []  mounts lateral pelvic/thigh supports swing-away or removable for transfers    []  Medial thigh support (Pommel)  [] decrease adduction  [] accommodate ROM  []  remove for transfers   []  alignment      []  Medial thigh   []  fixed      support mounts      []  swing-away   []  removable  []  mounts medial thigh supports   []  Mounts medial supports swing- away or removable for transfers       Component  Justification   [x]  Back       [x]  provide posterior trunk support []  facilitate tone  []  provide lumbar/sacral support []  accommodate deformity  []  support trunk in midline   []  custom required "off-the-shelf" back support will not accommodate deformity   []  provide lateral trunk  support []  accommodate or decrease tone            []  Back mounts  []  fixed  []  removable  []  attach back rest/cushion to wheelchair frame   []  Lateral trunk      supports  []  R []  L  []  decrease lateral trunk leaning  []  accommodate asymmetry    []  contour for increased contact  []  safety    []  control of tone    []  Lateral trunk      supports mounts  []  fixed  []  swing-away   []  removable  []  mounts lateral trunk supports     []  Mounts lateral trunk supports swing-away or removable for transfers   []  Anterior chest      strap, vest     []  decrease forward  movement of shoulder  []  decrease forward movement of trunk  []  safety/stability  []  added abdominal support  []  trunk alignment  []  assistance with shoulder control   []  decrease shoulder elevation    []  Headrest      []  provide posterior head support  []  provide posterior neck support  []  provide lateral head support  []  provide anterior head support  []  support during tilt and recline  []  improve feeding     []  improve respiration  []  placement of switches  []  safety    []  accommodate ROM   []  accommodate tone  []  improve visual orientation   []  Headrest           []  fixed []  removable []  flip down      Mounting hardware   []  swing-away laterals/switches  []  mount headrest   []  mounts headrest flip down or  removable for transfers  []  mount headrest swing-away laterals   []  mount switches     []  Neck Support    []  decrease neck rotation  []  decrease forward neck flexion   Pelvic Positioner    []  std hip belt          []  padded hip belt  []  dual pull hip belt  []  four point hip belt  []  stabilize tone  []  decrease falling out of chair  []  prevent excessive extension  []  special pull angle to control      rotation  []  pad for protection over boney   prominence  []  promote comfort    []  Essential needs        bag/pouch   []  medicines []  special food rorthotics []  clothing changes  []  diapers  []   catheter/hygiene []  ostomy supplies   The above equipment has a life- long use expectancy.  Growth and changes in medical and/or functional conditions would be the exceptions.   SUMMARY:  Why mobility device was selected; include why a lower level device is not appropriate:   ASSESSMENT:  CLINICAL IMPRESSION: Patient is a 47 y.o. female who was seen today for physical therapy evaluation and treatment for wheelchair evaluation. Patient has history of L AKA and R BKA. Patient reports of not wearing her L prosthetic leg due to it being uncomfortable her decreased confidence with balance with that leg on. Patient has not worn L prosthetic leg for > 1 year. Patient reports of pain with prolonged wear of R prosthetic leg and has not worn it for >1 month. Patient has not stood or walked with her prosthesis for >1 year. Patient is currently using a manual wheelchair but has difficulty with prolonged propulsion due to decreased endurance in UE. Patient also is on dialysis 3 days a week and reports significant weakness and fatigue post dialysis treatment where she is unable to propel her wheelchair to carry on independent activities at home. Patient was able to propel wheelchair at speed of 0.32 m/s with use of her bil UE, which is significantly below comfortable propulsion speed of 1.50 to 1.65 m/s Caralee Ates et al, 2022). Patient will benefit from group 2 power chair to improve her mobility with power wheelchair within her home, accommodate her fatigue during dialysis and improve her functional independence.   Ronnell Freshwater, W. (2022). Influence of wheelchair type on wheelchair propulsion test performance in community dwelling, adult wheelchair users. Neuro Rehabilitation, 50(4), [phone].    OBJECTIVE IMPAIRMENTS Abnormal gait, cardiopulmonary status limiting activity,  decreased activity tolerance, decreased balance, decreased endurance, decreased mobility,  difficulty walking, decreased ROM, decreased strength, hypomobility, increased fascial restrictions, increased muscle spasms, impaired UE functional use, prosthetic dependency , and pain.   ACTIVITY LIMITATIONS carrying, lifting, standing, squatting, stairs, transfers, toileting, hygiene/grooming, and locomotion level  PARTICIPATION LIMITATIONS: meal prep, cleaning, laundry, driving, and community activity  PERSONAL FACTORS Past/current experiences, Time since onset of injury/illness/exacerbation, and 1-2 comorbidities: R BKA, L BKA, CKD  are also affecting patient's functional outcome.   REHAB POTENTIAL: Good  CLINICAL DECISION MAKING: Stable/uncomplicated  EVALUATION COMPLEXITY: Low                                   GOALS: One time visit. No goals established.    PLAN: PT FREQUENCY: one time visit    Ileana Ladd, PT 01/25/2023, 6:35 PM    I concur with the above findings and recommendations of the therapist:  Physician name printed:         Physician's signature:      Date:

## 2023-01-25 NOTE — Telephone Encounter (Signed)
Medication refill  Pharmacy:[facility] - [place] - [address]

## 2023-01-27 ENCOUNTER — Other Ambulatory Visit (HOSPITAL_COMMUNITY): Payer: Self-pay

## 2023-01-29 ENCOUNTER — Other Ambulatory Visit (HOSPITAL_COMMUNITY): Payer: Self-pay

## 2023-02-01 ENCOUNTER — Other Ambulatory Visit (HOSPITAL_COMMUNITY): Payer: Self-pay

## 2023-02-01 ENCOUNTER — Other Ambulatory Visit: Payer: Self-pay

## 2023-02-01 MED ORDER — LANTUS SOLOSTAR 100 UNIT/ML ~~LOC~~ SOPN: 15.0000 [IU] | PEN_INJECTOR | Freq: Every day | SUBCUTANEOUS | 4 refills | Status: DC

## 2023-02-01 MED ORDER — ESCITALOPRAM OXALATE 5 MG PO TABS: 5.0000 mg | ORAL_TABLET | Freq: Every day | ORAL | 2 refills | Status: DC

## 2023-02-01 MED ORDER — ATORVASTATIN CALCIUM 80 MG PO TABS: 80.0000 mg | ORAL_TABLET | Freq: Every day | ORAL | 11 refills | Status: DC

## 2023-02-03 ENCOUNTER — Ambulatory Visit: Payer: Medicare Other

## 2023-02-03 ENCOUNTER — Telehealth: Payer: Self-pay | Admitting: *Deleted

## 2023-02-03 ENCOUNTER — Telehealth: Payer: Self-pay

## 2023-02-03 NOTE — Telephone Encounter (Signed)
Return pt's call who stated she's concern about losing weight and having no appetite; also being under her dry weight. Stated  Monday she was throwing up and having explosive diarrhea; not today. She was able to eat breakfast yesterday and went to dialysis. Stated she did mention this to the nurse at dialysis but nothing was done. She repeated being concern about losing weight- she want to inform her doctor to see what can be done?Marland Kitchen She has an appt w/GI; not until 7/29. Stated she will  call the kidney doctor office to see if she can talk to someone there also. Her next appt here is 02/15/23.

## 2023-02-03 NOTE — Progress Notes (Signed)
  Care Coordination   Note   02/03/2023 Name: DARCELL YACOUB MRN: [id number] DOB: 1976/06/28  Gerlene Fee Mcbryar is a 47 y.o. year old female who sees Quincy Simmonds, MD for primary care. I reached out to [name] by phone today to offer care coordination services.  Ms. Kniskern was given information about Care Coordination services today including:   The Care Coordination services include support from the care team which includes your Nurse Coordinator, Clinical Social Worker, or Pharmacist.  The Care Coordination team is here to help remove barriers to the health concerns and goals most important to you. Care Coordination services are voluntary, and the patient may decline or stop services at any time by request to their care team member.   Care Coordination Consent Status: Patient agreed to services and verbal consent obtained.   Follow up plan:  Telephone appointment with care coordination team member scheduled for:  6/26  Encounter Outcome:  Pt. Scheduled  [facility] Coordination Care Guide  Direct Dial: [phone]

## 2023-02-03 NOTE — Patient Outreach (Signed)
  Care Coordination   Initial Visit Note   02/03/2023 Name: TYMESHIA Frank MRN: [id number] DOB: 1975-10-18  Kathy Frank is a 47 y.o. year old female who sees Quincy Simmonds, MD for primary care. I spoke with  [name] by phone today.  What matters to the patients health and wellness today?  I had a conversation with [name], and she mentioned that she was experiencing numerous stomach-related symptoms that resembled gastroparesis. Among the symptoms she described were a sour smell in her stomach, feeling full, diarrhea, nausea, vomiting, lack of appetite, and weight loss. Gastroparesis can cause symptoms such as nausea, belching, bloating, indigestion, regurgitation, or vomiting. Other symptoms can include feeling full sooner than expected, loss of appetite, and diarrhea. I suggested that she speak with her nutritionist at dialysis when she goes tomorrow.  I plan to send her some information on the subject.     Goals Addressed             This Visit's Progress    I want to understand what is going on with my stomach       Patient Goals/Self Care Activities: -Patient/Caregiver will self-administer medications as prescribed as evidenced by self-report/primary caregiver report  -Patient/Caregiver will attend all scheduled provider appointments as evidenced by clinician review of documented attendance to scheduled appointments and patient/caregiver report -Patient/Caregiver will call pharmacy for medication refills as evidenced by patient report and review of pharmacy fill history as appropriate -Patient/Caregiver will call provider office for new concerns or questions as evidenced by review of documented incoming telephone call notes and patient report -Patient/Caregiver verbalizes understanding of plan -Patient/Caregiver will focus on medication adherence by taking medications as prescribed  Care Coordination Interventions: Reviewed medications with patient and discussed   Discussed plans with patient for ongoing care management follow up and provided patient with direct contact information for care management team Talked with the patient about her symptoms and asked her to talk with the nutritionist at dialysis about gastroparesis.        SDOH assessments and interventions completed:  Yes  SDOH Interventions Today    Flowsheet Row Most Recent Value  SDOH Interventions   Food Insecurity Interventions Intervention Not Indicated  Utilities Interventions Intervention Not Indicated        Care Coordination Interventions:  Yes, provided   Interventions Today    Flowsheet Row Most Recent Value  Chronic Disease   Chronic disease during today's visit Other  [Gatroparesis]  General Interventions   General Interventions Discussed/Reviewed General Interventions Discussed  Education Interventions   Education Provided Provided Education  Provided Verbal Education On Nutrition  Nutrition Interventions   Nutrition Discussed/Reviewed Nutrition Discussed  Pharmacy Interventions   Pharmacy Dicussed/Reviewed Pharmacy Topics Discussed        Follow up plan: Follow up call scheduled for 02/24/23 1130 am    Encounter Outcome:  Pt. Visit Completed   Juanell Fairly RN, BSN, [facility] Care Coordinator [facility]   Phone: [phone]

## 2023-02-03 NOTE — Telephone Encounter (Signed)
Pt is requesting a call back ... She stated  that his is not feeling well she  has been vomiting, diarrhea and  coming in under her dry weight  for her dialysis. She stated that this has been going on for the pass week or so

## 2023-02-03 NOTE — Patient Instructions (Signed)
Visit Information  Thank you for taking time to visit with me today. Please don't hesitate to contact me if I can be of assistance to you.   Following are the goals we discussed today:   Goals Addressed             This Visit's Progress    I want to understand what is going on with my stomach       Patient Goals/Self Care Activities: -Patient/Caregiver will self-administer medications as prescribed as evidenced by self-report/primary caregiver report  -Patient/Caregiver will attend all scheduled provider appointments as evidenced by clinician review of documented attendance to scheduled appointments and patient/caregiver report -Patient/Caregiver will call pharmacy for medication refills as evidenced by patient report and review of pharmacy fill history as appropriate -Patient/Caregiver will call provider office for new concerns or questions as evidenced by review of documented incoming telephone call notes and patient report -Patient/Caregiver verbalizes understanding of plan -Patient/Caregiver will focus on medication adherence by taking medications as prescribed  Care Coordination Interventions: Reviewed medications with patient and discussed  Discussed plans with patient for ongoing care management follow up and provided patient with direct contact information for care management team Talked with the patient about her symptoms and asked her to talk with the nutritionist at dialysis about gastroparesis.        Our next appointment is by telephone on 02/24/23 at 1130 am  Please call the care guide team at [phone] if you need to cancel or reschedule your appointment.   If you are experiencing a Mental Health or Behavioral Health Crisis or need someone to talk to, please call 1-800-273-TALK (toll free, 24 hour hotline)  The patient verbalized understanding of instructions, educational materials, and care plan provided today.   Juanell Fairly RN, BSN, [facility] Care Coordinator [facility]   Phone: [phone]

## 2023-02-04 ENCOUNTER — Encounter: Payer: Self-pay | Admitting: Student

## 2023-02-04 ENCOUNTER — Ambulatory Visit (INDEPENDENT_AMBULATORY_CARE_PROVIDER_SITE_OTHER): Payer: Medicare Other | Admitting: Student

## 2023-02-04 DIAGNOSIS — K529 Noninfective gastroenteritis and colitis, unspecified: Secondary | ICD-10-CM | POA: Insufficient documentation

## 2023-02-04 DIAGNOSIS — Z1231 Encounter for screening mammogram for malignant neoplasm of breast: Secondary | ICD-10-CM

## 2023-02-04 NOTE — Telephone Encounter (Signed)
Pt called - informed pt the doctor suggested an in-person appt or telehealth. Pt's agreeable to a telehealth. She stated she had talked to her dialysis doctor yesterday.  Telehealth appt schedule this am @ 0945 Am w/Dr Austin Miles.

## 2023-02-04 NOTE — Progress Notes (Signed)
[facility] Internal Medicine Residency Telephone Encounter Continuity Care Appointment  HPI:  This telephone encounter was created for [name] on 02/04/2023 for the following purpose/cc: diarrhea, weight loss. Patient reports diarrhea and weight loss for the past month since she returned from a trip to [place]. She went to visit her mother in [place] and ate mainly home-cooked meals with no high-risk exposures while there. After she got back home, she began having non-bloody, watery diarrhea with about 4-5 episodes each day over the past month. She states that her stools are loose and they are more foul-smelling than her typical bowel movements. She does endorse decreased appetite during this time along with nausea. Denies any fevers, chills, vomiting, abdominal pain. She denies any recent sick contacts or any close contacts with similar symptoms. She endorses weight loss during this time. Her last visit with [facility] on 5/31 revealed a weight of 80.3kg. She reports that her dry weight at recent dialysis visits has been between 70-72kg, which denotes about a 8-10kg weight loss over the past month. Her symptoms appear to be consistent with mild to moderate colitis, although unable to differentiate yet between infectious and inflammatory cause. Patient has a history of HIV (undetectable viral load, good CD4 count), ESRD on HD TTS, and history of housing instability/homelessness (now has stable housing) all of which makes her high risk for possible infectious etiology. Will have her follow up in-person on 7/3 to check stool studies to rule out infections such as Giardia and Cryptosporidia (given chronicity). If stool testing is negative, would recommend GI referral to evaluate for inflammatory bowel disease (she has an appointment with GI on 7/29). In the meantime, I have advised her to maintain good hydration and nutrition at home.   Past Medical History:  Past Medical History:  Diagnosis Date    Anal abscess    chronic   Anxiety    CAD (coronary artery disease)    CVA (cerebral vascular accident) (HCC)    Depression 06/28/2006   Qualifier: Diagnosis of  By: Laveda Abbe MD, Todd     Diabetes mellitus type 2 in obese 06/28/1994   Dyspnea    uses oxygeb 2 liters per minute at dialysis   End stage renal disease on dialysis [facility])    on hemodialysis T/Th/Sat   Erosive esophagitis    Esophageal reflux    Eye redness    Gastroparesis    ? 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   Assessment / Plan / Recommendations:  Please see A&P under problem oriented charting for assessment of the patient's acute and chronic medical conditions.  As always, pt is advised that if symptoms worsen or new symptoms arise, they should go to an urgent care facility or to to ER for further evaluation.   Consent and  Medical Decision Making:  Patient discussed with Dr. Oswaldo Done This is a telephone encounter between Kathy Frank and Merrilyn Puma on 02/04/2023 for diarrhea, weight loss. The visit was conducted with the patient located at home and Merrilyn Puma at [facility]. The patient's identity was confirmed using their DOB and current address. The patient has consented to being evaluated through a telephone encounter and understands the associated  risks (an examination cannot be done and the patient may need to come in for an appointment) / benefits (allows the patient to remain at home, decreasing exposure to coronavirus). I personally spent 20 minutes on medical discussion.

## 2023-02-04 NOTE — Addendum Note (Signed)
Addended by: Merrilyn Puma on: 02/04/2023 11:18 AM   Modules accepted: Orders

## 2023-02-04 NOTE — Assessment & Plan Note (Signed)
Patient reports diarrhea and weight loss for the past month since she returned from a trip to [place]. She went to visit her mother in [place] and ate mainly home-cooked meals with no high-risk exposures while there. After she got back home, she began having non-bloody, watery diarrhea with about 4-5 episodes each day over the past month. She states that her stools are loose and they are more foul-smelling than her typical bowel movements. She does endorse decreased appetite during this time along with nausea. Denies any fevers, chills, vomiting, abdominal pain. She denies any recent sick contacts or any close contacts with similar symptoms. She endorses weight loss during this time. Her last visit with [facility] on 5/31 revealed a weight of 80.3kg. She reports that her dry weight at recent dialysis visits has been between 70-72kg, which denotes about a 8-10kg weight loss over the past month.  Her symptoms appear to be consistent with mild to moderate colitis, although unable to differentiate yet between infectious and inflammatory cause. Patient has a history of HIV (undetectable viral load, good CD4 count), ESRD on HD TTS, and history of housing instability/homelessness (now has stable housing) all of which makes her high risk for possible infectious etiology. Will have her follow up in-person on 7/3 to check stool studies to rule out infections such as Giardia and Cryptosporidia (given chronicity). If stool testing is negative, would recommend GI referral to evaluate for inflammatory bowel disease (she has an appointment with GI on 7/29). In the meantime, I have advised her to maintain good hydration and nutrition at home.  Plan: -maintain good hydration and nutrition until in-person f/u -f/u in-person on 7/3 to obtain stool studies (rule out giardia, cryptosporidia, other infectious causes of chronic diarrhea) -if stool studies negative, consider GI evaluation to r/o IBD (has GI appointment on 7/29)

## 2023-02-05 NOTE — Progress Notes (Signed)
Internal Medicine Clinic Attending  Case discussed with Dr. Jinwala  At the time of the visit.  We reviewed the resident's history and exam and pertinent patient test results.  I agree with the assessment, diagnosis, and plan of care documented in the resident's note.  

## 2023-02-08 NOTE — Telephone Encounter (Signed)
Hey Dr.Rogers looks like you sent these rx to an alternative pharmacy,rx were supposed to be sent to the pharmacy attached to this note: [facility] - [place], [place] - [address] (Pharmacy) c

## 2023-02-09 ENCOUNTER — Telehealth: Payer: Self-pay | Admitting: *Deleted

## 2023-02-09 NOTE — Telephone Encounter (Signed)
Called patient (someone picked up / hung up ) was calling patient to inform her of letter being mailed for her mammogram appointment.

## 2023-02-10 ENCOUNTER — Encounter: Payer: Medicare Other | Admitting: Student

## 2023-02-14 ENCOUNTER — Encounter (HOSPITAL_COMMUNITY): Payer: Self-pay

## 2023-02-15 ENCOUNTER — Encounter (HOSPITAL_COMMUNITY): Payer: Self-pay | Admitting: Student in an Organized Health Care Education/Training Program

## 2023-02-15 ENCOUNTER — Ambulatory Visit (INDEPENDENT_AMBULATORY_CARE_PROVIDER_SITE_OTHER): Payer: Medicare Other | Admitting: Internal Medicine

## 2023-02-15 ENCOUNTER — Other Ambulatory Visit: Payer: Self-pay

## 2023-02-15 ENCOUNTER — Encounter (HOSPITAL_COMMUNITY): Payer: Self-pay

## 2023-02-15 ENCOUNTER — Encounter: Payer: Self-pay | Admitting: Internal Medicine

## 2023-02-15 ENCOUNTER — Inpatient Hospital Stay (HOSPITAL_COMMUNITY)
Admission: AD | Admit: 2023-02-15 | Discharge: 2023-02-22 | DRG: 474 | Disposition: A | Discharge status: Home Health | Payer: Medicare Other | Source: Ambulatory Visit | Attending: Internal Medicine | Admitting: Internal Medicine

## 2023-02-15 VITALS — BP 164/88 | HR 80 | Temp 97.0°F | Ht 65.0 in

## 2023-02-15 DIAGNOSIS — K3184 Gastroparesis: Secondary | ICD-10-CM | POA: Diagnosis present

## 2023-02-15 DIAGNOSIS — K529 Noninfective gastroenteritis and colitis, unspecified: Secondary | ICD-10-CM

## 2023-02-15 DIAGNOSIS — N186 End stage renal disease: Secondary | ICD-10-CM | POA: Diagnosis present

## 2023-02-15 DIAGNOSIS — M898X9 Other specified disorders of bone, unspecified site: Secondary | ICD-10-CM | POA: Diagnosis present

## 2023-02-15 DIAGNOSIS — T879 Unspecified complications of amputation stump: Secondary | ICD-10-CM

## 2023-02-15 DIAGNOSIS — Z560 Unemployment, unspecified: Secondary | ICD-10-CM | POA: Diagnosis not present

## 2023-02-15 DIAGNOSIS — Y835 Amputation of limb(s) as the cause of abnormal reaction of the patient, or of later complication, without mention of misadventure at the time of the procedure: Secondary | ICD-10-CM | POA: Diagnosis present

## 2023-02-15 DIAGNOSIS — E1122 Type 2 diabetes mellitus with diabetic chronic kidney disease: Secondary | ICD-10-CM | POA: Diagnosis present

## 2023-02-15 DIAGNOSIS — F32A Depression, unspecified: Secondary | ICD-10-CM | POA: Diagnosis present

## 2023-02-15 DIAGNOSIS — M869 Osteomyelitis, unspecified: Secondary | ICD-10-CM | POA: Diagnosis present

## 2023-02-15 DIAGNOSIS — Z89611 Acquired absence of right leg above knee: Secondary | ICD-10-CM | POA: Diagnosis not present

## 2023-02-15 DIAGNOSIS — Z992 Dependence on renal dialysis: Secondary | ICD-10-CM

## 2023-02-15 DIAGNOSIS — F419 Anxiety disorder, unspecified: Secondary | ICD-10-CM

## 2023-02-15 DIAGNOSIS — Z833 Family history of diabetes mellitus: Secondary | ICD-10-CM

## 2023-02-15 DIAGNOSIS — I25110 Atherosclerotic heart disease of native coronary artery with unstable angina pectoris: Secondary | ICD-10-CM | POA: Diagnosis not present

## 2023-02-15 DIAGNOSIS — L03115 Cellulitis of right lower limb: Secondary | ICD-10-CM | POA: Diagnosis present

## 2023-02-15 DIAGNOSIS — M86661 Other chronic osteomyelitis, right tibia and fibula: Secondary | ICD-10-CM | POA: Diagnosis not present

## 2023-02-15 DIAGNOSIS — Z79899 Other long term (current) drug therapy: Secondary | ICD-10-CM

## 2023-02-15 DIAGNOSIS — Z8673 Personal history of transient ischemic attack (TIA), and cerebral infarction without residual deficits: Secondary | ICD-10-CM

## 2023-02-15 DIAGNOSIS — M86261 Subacute osteomyelitis, right tibia and fibula: Secondary | ICD-10-CM | POA: Diagnosis not present

## 2023-02-15 DIAGNOSIS — E1169 Type 2 diabetes mellitus with other specified complication: Secondary | ICD-10-CM | POA: Diagnosis present

## 2023-02-15 DIAGNOSIS — E113393 Type 2 diabetes mellitus with moderate nonproliferative diabetic retinopathy without macular edema, bilateral: Secondary | ICD-10-CM | POA: Diagnosis present

## 2023-02-15 DIAGNOSIS — Z9181 History of falling: Secondary | ICD-10-CM

## 2023-02-15 DIAGNOSIS — Z8711 Personal history of peptic ulcer disease: Secondary | ICD-10-CM

## 2023-02-15 DIAGNOSIS — K219 Gastro-esophageal reflux disease without esophagitis: Secondary | ICD-10-CM | POA: Diagnosis present

## 2023-02-15 DIAGNOSIS — I120 Hypertensive chronic kidney disease with stage 5 chronic kidney disease or end stage renal disease: Secondary | ICD-10-CM | POA: Diagnosis present

## 2023-02-15 DIAGNOSIS — T8733 Neuroma of amputation stump, right lower extremity: Secondary | ICD-10-CM | POA: Diagnosis not present

## 2023-02-15 DIAGNOSIS — T8743 Infection of amputation stump, right lower extremity: Principal | ICD-10-CM | POA: Diagnosis present

## 2023-02-15 DIAGNOSIS — Z8701 Personal history of pneumonia (recurrent): Secondary | ICD-10-CM

## 2023-02-15 DIAGNOSIS — M86161 Other acute osteomyelitis, right tibia and fibula: Secondary | ICD-10-CM | POA: Diagnosis present

## 2023-02-15 DIAGNOSIS — E785 Hyperlipidemia, unspecified: Secondary | ICD-10-CM | POA: Diagnosis present

## 2023-02-15 DIAGNOSIS — Z87891 Personal history of nicotine dependence: Secondary | ICD-10-CM

## 2023-02-15 DIAGNOSIS — G43909 Migraine, unspecified, not intractable, without status migrainosus: Secondary | ICD-10-CM | POA: Diagnosis present

## 2023-02-15 DIAGNOSIS — E1151 Type 2 diabetes mellitus with diabetic peripheral angiopathy without gangrene: Secondary | ICD-10-CM | POA: Diagnosis present

## 2023-02-15 DIAGNOSIS — Z89612 Acquired absence of left leg above knee: Secondary | ICD-10-CM | POA: Diagnosis not present

## 2023-02-15 DIAGNOSIS — E119 Type 2 diabetes mellitus without complications: Secondary | ICD-10-CM

## 2023-02-15 DIAGNOSIS — E1143 Type 2 diabetes mellitus with diabetic autonomic (poly)neuropathy: Secondary | ICD-10-CM | POA: Diagnosis present

## 2023-02-15 DIAGNOSIS — Z95828 Presence of other vascular implants and grafts: Secondary | ICD-10-CM

## 2023-02-15 DIAGNOSIS — B20 Human immunodeficiency virus [HIV] disease: Secondary | ICD-10-CM | POA: Diagnosis present

## 2023-02-15 DIAGNOSIS — Z8719 Personal history of other diseases of the digestive system: Secondary | ICD-10-CM

## 2023-02-15 DIAGNOSIS — Z7982 Long term (current) use of aspirin: Secondary | ICD-10-CM

## 2023-02-15 DIAGNOSIS — D631 Anemia in chronic kidney disease: Secondary | ICD-10-CM | POA: Diagnosis present

## 2023-02-15 DIAGNOSIS — I2510 Atherosclerotic heart disease of native coronary artery without angina pectoris: Secondary | ICD-10-CM | POA: Diagnosis present

## 2023-02-15 DIAGNOSIS — M899 Disorder of bone, unspecified: Secondary | ICD-10-CM | POA: Diagnosis not present

## 2023-02-15 DIAGNOSIS — I10 Essential (primary) hypertension: Secondary | ICD-10-CM | POA: Diagnosis present

## 2023-02-15 DIAGNOSIS — Z794 Long term (current) use of insulin: Secondary | ICD-10-CM | POA: Diagnosis not present

## 2023-02-15 DIAGNOSIS — Z810 Family history of intellectual disabilities: Secondary | ICD-10-CM

## 2023-02-15 LAB — CBC WITH DIFFERENTIAL/PLATELET
Abs Immature Granulocytes: 0.02 10*3/uL (ref 0.00–0.07)
Basophils Absolute: 0.1 10*3/uL (ref 0.0–0.1)
Basophils Relative: 1 %
Eosinophils Absolute: 0.8 10*3/uL — ABNORMAL HIGH (ref 0.0–0.5)
Eosinophils Relative: 15 %
HCT: 28.1 % — ABNORMAL LOW (ref 36.0–46.0)
Hemoglobin: 8.5 g/dL — ABNORMAL LOW (ref 12.0–15.0)
Immature Granulocytes: 0 %
Lymphocytes Relative: 22 %
Lymphs Abs: 1.2 10*3/uL (ref 0.7–4.0)
MCH: 26.4 pg (ref 26.0–34.0)
MCHC: 30.2 g/dL (ref 30.0–36.0)
MCV: 87.3 fL (ref 80.0–100.0)
Monocytes Absolute: 0.5 10*3/uL (ref 0.1–1.0)
Monocytes Relative: 9 %
Neutro Abs: 2.9 10*3/uL (ref 1.7–7.7)
Neutrophils Relative %: 53 %
Platelets: 320 10*3/uL (ref 150–400)
RBC: 3.22 MIL/uL — ABNORMAL LOW (ref 3.87–5.11)
RDW: 16.1 % — ABNORMAL HIGH (ref 11.5–15.5)
WBC: 5.5 10*3/uL (ref 4.0–10.5)
nRBC: 0 % (ref 0.0–0.2)

## 2023-02-15 LAB — HEPATITIS B SURFACE ANTIGEN: Hepatitis B Surface Ag: NONREACTIVE

## 2023-02-15 LAB — BASIC METABOLIC PANEL
Anion gap: 16 — ABNORMAL HIGH (ref 5–15)
BUN: 33 mg/dL — ABNORMAL HIGH (ref 6–20)
CO2: 22 mmol/L (ref 22–32)
Calcium: 8.8 mg/dL — ABNORMAL LOW (ref 8.9–10.3)
Chloride: 99 mmol/L (ref 98–111)
Creatinine, Ser: 9.55 mg/dL — ABNORMAL HIGH (ref 0.44–1.00)
GFR, Estimated: 5 mL/min — ABNORMAL LOW (ref >60)
Glucose, Bld: 221 mg/dL — ABNORMAL HIGH (ref 70–99)
Potassium: 4.6 mmol/L (ref 3.5–5.1)
Sodium: 137 mmol/L (ref 135–145)

## 2023-02-15 LAB — GLUCOSE, CAPILLARY: Glucose-Capillary: 186 mg/dL — ABNORMAL HIGH (ref 70–99)

## 2023-02-15 MED ORDER — ONDANSETRON HCL 4 MG PO TABS: 4.0000 mg | ORAL_TABLET | Freq: Four times a day (QID) | ORAL | Status: DC | PRN

## 2023-02-15 MED ORDER — INSULIN ASPART 100 UNIT/ML IJ SOLN
0.0000 [IU] | Freq: Three times a day (TID) | INTRAMUSCULAR | Status: DC
  Administered 2023-02-16 – 2023-02-17 (×3): 1 [IU] via SUBCUTANEOUS
  Administered 2023-02-18: 2 [IU] via SUBCUTANEOUS
  Administered 2023-02-20: 1 [IU] via SUBCUTANEOUS
  Administered 2023-02-21: 5 [IU] via SUBCUTANEOUS
  Administered 2023-02-21: 1 [IU] via SUBCUTANEOUS
  Administered 2023-02-21: 9 [IU] via SUBCUTANEOUS
  Administered 2023-02-22: 2 [IU] via SUBCUTANEOUS
  Administered 2023-02-22: 3 [IU] via SUBCUTANEOUS

## 2023-02-15 MED ORDER — IRBESARTAN 300 MG PO TABS
300.0000 mg | ORAL_TABLET | Freq: Every day | ORAL | Status: DC
  Administered 2023-02-16 – 2023-02-22 (×5): 300 mg via ORAL
  Filled 2023-02-15 (×5): qty 1

## 2023-02-15 MED ORDER — SODIUM CHLORIDE 0.9 % IV SOLN: 2.0000 g | INTRAVENOUS | Status: DC

## 2023-02-15 MED ORDER — VANCOMYCIN HCL 1500 MG/300ML IV SOLN
1500.0000 mg | INTRAVENOUS | Status: AC
  Administered 2023-02-15: 1500 mg via INTRAVENOUS
  Filled 2023-02-15: qty 300

## 2023-02-15 MED ORDER — HEPARIN SODIUM (PORCINE) 5000 UNIT/ML IJ SOLN
5000.0000 [IU] | Freq: Three times a day (TID) | INTRAMUSCULAR | Status: DC
  Administered 2023-02-15 – 2023-02-22 (×19): 5000 [IU] via SUBCUTANEOUS
  Filled 2023-02-15 (×20): qty 1

## 2023-02-15 MED ORDER — PANTOPRAZOLE SODIUM 40 MG PO TBEC
40.0000 mg | DELAYED_RELEASE_TABLET | Freq: Two times a day (BID) | ORAL | Status: DC
  Administered 2023-02-15 – 2023-02-22 (×12): 40 mg via ORAL
  Filled 2023-02-15 (×12): qty 1

## 2023-02-15 MED ORDER — ACETAMINOPHEN 500 MG PO TABS
1000.0000 mg | ORAL_TABLET | Freq: Once | ORAL | Status: AC
  Administered 2023-02-15: 1000 mg via ORAL
  Filled 2023-02-15: qty 2

## 2023-02-15 MED ORDER — CHLORHEXIDINE GLUCONATE CLOTH 2 % EX PADS
6.0000 | MEDICATED_PAD | Freq: Every day | CUTANEOUS | Status: DC
  Administered 2023-02-17 – 2023-02-19 (×3): 6 via TOPICAL

## 2023-02-15 MED ORDER — ONDANSETRON HCL 4 MG/2ML IJ SOLN
4.0000 mg | Freq: Four times a day (QID) | INTRAMUSCULAR | Status: DC | PRN
  Administered 2023-02-16: 4 mg via INTRAVENOUS
  Filled 2023-02-15: qty 2

## 2023-02-15 MED ORDER — SODIUM CHLORIDE 0.9 % IV SOLN
1.0000 g | INTRAVENOUS | Status: DC
  Administered 2023-02-15 – 2023-02-17 (×3): 1 g via INTRAVENOUS
  Filled 2023-02-15 (×4): qty 10

## 2023-02-15 MED ORDER — INSULIN GLARGINE-YFGN 100 UNIT/ML ~~LOC~~ SOLN
7.0000 [IU] | Freq: Every day | SUBCUTANEOUS | Status: DC
  Administered 2023-02-16 – 2023-02-22 (×6): 7 [IU] via SUBCUTANEOUS
  Filled 2023-02-15 (×7): qty 0.07

## 2023-02-15 MED ORDER — VANCOMYCIN HCL 750 MG/150ML IV SOLN
750.0000 mg | INTRAVENOUS | Status: DC
  Administered 2023-02-16 – 2023-02-20 (×3): 750 mg via INTRAVENOUS
  Filled 2023-02-15 (×7): qty 150

## 2023-02-15 MED ORDER — AMLODIPINE BESYLATE 10 MG PO TABS
10.0000 mg | ORAL_TABLET | Freq: Every day | ORAL | Status: DC
  Administered 2023-02-16 – 2023-02-22 (×4): 10 mg via ORAL
  Filled 2023-02-15 (×5): qty 1

## 2023-02-15 MED ORDER — SODIUM CHLORIDE 0.9% FLUSH: 10.0000 mL | INTRAVENOUS | Status: DC | PRN

## 2023-02-15 MED ORDER — HYDROMORPHONE HCL 2 MG PO TABS
2.0000 mg | ORAL_TABLET | Freq: Four times a day (QID) | ORAL | Status: DC | PRN
  Administered 2023-02-15: 2 mg via ORAL
  Filled 2023-02-15 (×2): qty 1

## 2023-02-15 MED ORDER — NEPRO/CARBSTEADY PO LIQD
237.0000 mL | Freq: Two times a day (BID) | ORAL | Status: DC
  Administered 2023-02-16 – 2023-02-20 (×4): 237 mL via ORAL

## 2023-02-15 MED ORDER — INSULIN ASPART 100 UNIT/ML IJ SOLN
0.0000 [IU] | Freq: Every day | INTRAMUSCULAR | Status: DC
  Administered 2023-02-21: 2 [IU] via SUBCUTANEOUS

## 2023-02-15 NOTE — Progress Notes (Deleted)
Wound on right residual limb There since Friday. Was blister that opened. White drainage. Been cleaning with betadine.  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 Linnell Fulling. She has to undergo revision 8/22 due to infection with wound dehiscence.  Chronic Diarrhea for last 3 months Decreased appetite Vomiting a few weeks ago Watery diarrhea, 4-5 episodes daily No blood in stool or pus  Weight loss: HIV undetectable viral load, good CD4 count (397 1/24, with undectable viral load)  -giardia, cryptosporidia -appt w GI 7/29  Need for wheelchair Bilateral amputee with HD fistula making manual wheelchair difficult.  HTN 164/68 Recheck at  Prescribed amlodipine  Panic attack -lexapro -atarax   DM Lab Results  Component Value Date   HGBA1C 6.5 (A) 10/19/2022

## 2023-02-15 NOTE — Progress Notes (Signed)
Attestation for Student Documentation:  I personally was present and performed or re-performed the history, physical exam and medical decision-making activities of this service and have verified that the service and findings are accurately documented in the student's note.  Patient direct admitted for purulent wound on right BKA. Would consider obtaining stool studies while she is admitted. TDC remains in place from late April. If graft is now functional would question when [facility] could be removed.  Jayron Maqueda, DO 02/16/2023, 6:00 AM     This is a Psychologist, occupational Note.  The care of the patient was discussed with Dr. Rudene Christians and the assessment and plan was formulated with their assistance.  Please see their note for official documentation of the patient encounter.   Subjective:   Patient ID: Kathy Frank female   DOB: Dec 02, 1975 47 y.o.   MRN: [id number]  HPI: Kathy Frank is a 47 y.o. female with a PMH of ESRD on hemodialysis, type II DM, well-controlled HIV, gastric ulcers, PVD and right BKA (2022) who presented to the clinic with a open wound on her right residual limb and 3 months of diarrhea.  Ms. Goldade states that her wound started as a blister last week which burst last Friday. It had a light pink drainage, which she cleaned with betadine. She reports the wound being painful and she noticed some white drainage, but denies any fever.  She also has 3 months long diarrhea which is watery and foul smelling. She reports having to go to the bathroom ~4x a day, decreased appetite, weight loss from 173 lbs to 171 lbs, but denies any blood or mucus in her stool and denies any n/v except once. She thinks she had vomiting then due to gastroparesis. She tolerates her food well and takes imodium when she goes to dialysis which has helped her tolerate sessions.   Past Medical History:  Diagnosis Date   Analgesic overuse headache 06/28/2021   Anemia, posthemorrhagic, acute 10/14/2022    Anxiety    CAD (coronary artery disease)    CAP (community acquired pneumonia) 09/29/2022   CVA (cerebral vascular accident) [facility])    Depression 06/28/2006   Qualifier: Diagnosis of  By: Laveda Abbe MD, Todd     Diabetes mellitus type 2 in obese 06/28/1994   Dyspnea    uses oxygeb 2 liters per minute at dialysis   End stage renal disease on dialysis [facility])    on hemodialysis T/Th/Sat   Erosive esophagitis    Esophageal reflux    Eye redness    Fecal incontinence 02/04/2018   Gastroparesis    ? diabetic   GERD (gastroesophageal reflux disease)    Human immunodeficiency virus (HIV) disease (HCC) 04/23/2016   Hyperkalemia 10/12/2022   Hyperlipidemia    Hypertension    Hypertensive emergency 11/09/2021   Hypertensive urgency 05/20/2021   Metabolic bone disease 05/02/2019   Moderate nonproliferative diabetic retinopathy of both eyes (HCC) 11/21/2014   11/14/14: Noted on retinal imaging; needs follow-up imaging in 6 months  05/22/16: Noted on retinal imaging again; needs follow-up imaging in 6 months   PAD (peripheral artery disease) (HCC)    Type 2 diabetes mellitus with diabetic peripheral angiopathy without gangrene (HCC) 05/01/2019   Wound infection s/p L transmetatarsal amputation      Family History  Problem Relation Age of Onset   Diabetes Mother    Diabetes Brother    Diabetes Daughter    Diabetes Daughter    Mental retardation Brother  died from PNA   Diabetes Maternal Grandmother    Social History   Socioeconomic History   Marital status: Single    Spouse name: Not on file   Number of children: Not on file   Years of education: 10   Highest education level: Not on file  Occupational History    Employer: UNEMPLOYED  Tobacco Use   Smoking status: Former    Packs/day: 0.10    Years: 10.00    Additional pack years: 0.00    Total pack years: 1.00    Types: Cigarettes    Start date: 11/17/2013    Quit date: 09/09/2014    Years since quitting: 8.4    Smokeless tobacco: Never  Vaping Use   Vaping Use: Never used  Substance and Sexual Activity   Alcohol use: Not Currently   Drug use: Yes    Types: Marijuana    Comment: 2 times a monthy -  `last time [place] 2022   Sexual activity: Not Currently    Partners: Female, Female  Other Topics Concern   Not on file  Social History Narrative   Not on file   Social Determinants of Health   Financial Resource Strain: Low Risk  (10/19/2022)   Overall Financial Resource Strain (CARDIA)    Difficulty of Paying Living Expenses: Not hard at all  Food Insecurity: No Food Insecurity (02/15/2023)   Hunger Vital Sign    Worried About Running Out of Food in the Last Year: Never true    Ran Out of Food in the Last Year: Never true  Transportation Needs: No Transportation Needs (02/15/2023)   PRAPARE - Administrator, Civil Service (Medical): No    Lack of Transportation (Non-Medical): No  Physical Activity: Inactive (10/19/2022)   Exercise Vital Sign    Days of Exercise per Week: 0 days    Minutes of Exercise per Session: 0 min  Stress: Stress Concern Present (10/19/2022)   [employer] of Occupational Health - Occupational Stress Questionnaire    Feeling of Stress : Very much  Social Connections: Socially Isolated (10/19/2022)   Social Connection and Isolation Panel [NHANES]    Frequency of Communication with Friends and Family: More than three times a week    Frequency of Social Gatherings with Friends and Family: Never    Attends Religious Services: Never    Database administrator or Organizations: No    Attends Engineer, structural: Never    Marital Status: Never married   Review of Systems: Endorses chronic diarrhea  Denies fever, nausea, chills Objective:  Physical Exam:  Vitals:   02/15/23 0944 02/15/23 1202  BP: (!) 164/68 (!) 164/88  Pulse: 81 80  Temp: (!) 97 F (36.1 C)   TempSrc: Oral   SpO2: 100%   Height: 5\' 5"  (1.651 m)    Constitutional:  well-appearing  Cardiovascular: regular rate and rhythm, no m/r/g, av graft in left upper extremity, TDC in place Pulmonary/Chest: normal work of breathing on room air, lungs clear to auscultation bilaterally Abdominal: soft, non-tender, non-distended MSK: Purulent drainage with an open would on right residual limb, appears to be a tract present but did not probe, fluctuance present surrounding wound with TTP, left AKA present, 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 Solis has purulent drainage on right residual limb as described on HPI and physical exam. She had  a right BKA in 6/22 in the setting of gangrene due to severe PVD, amputation performed by Dr. Linnell Fulling. She had to undergo revision 8/22 due to infection with wound dehiscence. Given exam and sx we are concerned for abscess and want her to be admitted to the hospital. We wanted to get her direct admission to the floor, however no beds were available; advised her to go to the ED and  direct admission ordered, however she declined going to the ED. Ms. Kingen said she needed to be home at 2 pm to get her grand kids but reported she will come back to the ED to get admitted. We will follow up to make sure she comes back in the afternoon.   Colitis Ms. Petersheim has been having chronic diarrhea for last 3 months. Given her sx and social history in setting of HIV, we want to rule out infectious cause of giardia vs cryptosporidium. She has no fever, pus or blood so she should also get stool osmolarity to rule out non inflammatory causes.  She most likely wil be admitted to the hospital, it would be best to collect her stool samples during her stay once on the floor. She also has follow up with GI July 29th.    Anxiety Ms. Loges also reports the lexapro she is on has not been helping her anxiety and panic attacks. In setting of ESRD, before increasing  her dosage, she should get an EKG done to make sure she does not have QT prolongation.  S/P arteriovenous (AV) graft placement When in [place] in 4/24 she had AVF complication requiring graft placement. This was complicated by infection with revision at [facility] by Dr. Juanetta Gosling in 5/24. Graft was cleared for HD use first week of July. She reports that it was used last week successfully. TDC remains in place from 12/02/22. If graft is working would think TDC could be removed soon.

## 2023-02-15 NOTE — Progress Notes (Signed)
Pharmacy Antibiotic Note  Kathy Frank is a 47 y.o. female admitted on 02/15/2023 with  R stump wound .  Pharmacy has been consulted for Vancomycin and Cefepime dosing.  Blood and wound cultures have been obtained.  Plan: Cefepime 1gm IV q24h Vancomycin 1500 mg IV now then 750 mg IV qHD  Will f/u HD schedule and tolerance, micro data, and pt's clinical condition Pre-HD Vanc levels prn   Weight: 75.5 kg (166 lb 7.2 oz)  Temp (24hrs), Avg:97.6 F (36.4 C), Min:97 F (36.1 C), Max:98.2 F (36.8 C)  Recent Labs  Lab 02/15/23 1715  WBC 5.5    CrCl cannot be calculated (Patient's most recent lab result is older than the maximum 21 days allowed.).    No Known Allergies  Antimicrobials this admission: 7/8 Vanc >>  7/8 Cefepime >>   Microbiology results: 7/8 BCx:  7/8 Stump wound Cx:   Thank you for allowing pharmacy to be a part of this patient's care.  Christoper Fabian, PharmD, BCPS Please see amion for complete clinical pharmacist phone list 02/15/2023 6:45 PM

## 2023-02-15 NOTE — Progress Notes (Signed)
This is a Psychologist, occupational Note.  The care of the patient was discussed with Dr. Rudene Christians and the assessment and plan was formulated with their assistance.  Please see their note for official documentation of the patient encounter.   Subjective:   Patient ID: Kathy Frank female   DOB: 1975/11/24 47 y.o.   MRN: [id number]  HPI: [name] is a 47 y.o. female with a PMH of ESRD on hemodialysis, type II DM, well-controlled HIV, gastric ulcers, PVD, gastroparesis and BKA (2022) who presented to the clinic with a open wound on her right residual knee and 3 month long diarrhea.  [name] describes her wound had a blister which burst last Friday. It had a light pink drainage, which she cleaned with betadine. She reports the wound being painful, but denies any fever.  She also has 3 months long diarrhea which is watery and foul smelling. She reports having to go to the bathroom ~4x a day, decreased appetite, weight loss from 73 lbs to 71 lbs, but denies any blood or mucus in her stool and denies any n/v except once. She reports it "smelled like eggs" when she threw up and thinks she had vomiting then due to gastroparesis. She tolerates her food well and takes imodium when she goes to dialysis which has helped her.   Her other PMH are below.  Past Medical History:  Diagnosis Date   Abdominal pain 02/06/2019   Anal abscess    chronic   Analgesic overuse headache 06/28/2021   Anemia, posthemorrhagic, acute 10/14/2022   Anxiety    CAD (coronary artery disease)    CAP (community acquired pneumonia) 09/29/2022   Community acquired pneumonia 06/25/2022   CVA (cerebral vascular accident) [facility])    Depression 06/28/2006   Qualifier: Diagnosis of  By: Laveda Abbe MD, Todd     Diabetes mellitus type 2 in obese 06/28/1994   Dyspnea    uses oxygeb 2 liters per minute at dialysis   End stage renal disease on dialysis [facility])    on hemodialysis T/Th/Sat   Epistaxis 05/27/2021   Erosive  esophagitis    Esophageal reflux    Eye redness    Fecal incontinence 02/04/2018   Gastroparesis    ? diabetic   GERD (gastroesophageal reflux disease)    History of blood transfusion 2019   Human immunodeficiency virus (HIV) disease (HCC) 04/23/2016   Hyperkalemia 10/12/2022   Hyperlipidemia    Hypertension    Hypertensive emergency 11/09/2021   Hypertensive urgency 05/20/2021   Metabolic bone disease 05/02/2019   Moderate nonproliferative diabetic retinopathy of both eyes (HCC) 11/21/2014   11/14/14: Noted on retinal imaging; needs follow-up imaging in 6 months  05/22/16: Noted on retinal imaging again; needs follow-up imaging in 6 months   PAD (peripheral artery disease) (HCC)    Type 2 diabetes mellitus with diabetic peripheral angiopathy without gangrene (HCC) 05/01/2019   Wears dentures    lower   Wound infection s/p L transmetatarsal amputation      Family History  Problem Relation Age of Onset   Diabetes Mother    Diabetes Brother    Diabetes Daughter    Diabetes Daughter    Mental retardation Brother        died from PNA   Diabetes Maternal Grandmother    Social History   Socioeconomic History   Marital status: Single    Spouse name: Not on file   Number of children: Not on file   Years  of education: 10   Highest education level: Not on file  Occupational History    Employer: UNEMPLOYED  Tobacco Use   Smoking status: Former    Packs/day: 0.10    Years: 10.00    Additional pack years: 0.00    Total pack years: 1.00    Types: Cigarettes    Start date: 11/17/2013    Quit date: 09/09/2014    Years since quitting: 8.4   Smokeless tobacco: Never  Vaping Use   Vaping Use: Never used  Substance and Sexual Activity   Alcohol use: Not Currently   Drug use: Yes    Types: Marijuana    Comment: 2 times a monthy -  `last time [place] 2022   Sexual activity: Not Currently    Partners: Female, Female  Other Topics Concern   Not on file  Social History Narrative    Not on file   Social Determinants of Health   Financial Resource Strain: Low Risk  (10/19/2022)   Overall Financial Resource Strain (CARDIA)    Difficulty of Paying Living Expenses: Not hard at all  Food Insecurity: No Food Insecurity (12/23/2022)   Hunger Vital Sign    Worried About Running Out of Food in the Last Year: Never true    Ran Out of Food in the Last Year: Never true  Transportation Needs: No Transportation Needs (12/23/2022)   PRAPARE - Administrator, Civil Service (Medical): No    Lack of Transportation (Non-Medical): No  Recent Concern: Transportation Needs - Unmet Transportation Needs (11/11/2022)   PRAPARE - Administrator, Civil Service (Medical): Yes    Lack of Transportation (Non-Medical): No  Physical Activity: Inactive (10/19/2022)   Exercise Vital Sign    Days of Exercise per Week: 0 days    Minutes of Exercise per Session: 0 min  Stress: Stress Concern Present (10/19/2022)   [employer] of Occupational Health - Occupational Stress Questionnaire    Feeling of Stress : Very much  Social Connections: Socially Isolated (10/19/2022)   Social Connection and Isolation Panel [NHANES]    Frequency of Communication with Friends and Family: More than three times a week    Frequency of Social Gatherings with Friends and Family: Never    Attends Religious Services: Never    Database administrator or Organizations: No    Attends Banker Meetings: Never    Marital Status: Never married   Review of Systems: Pertinent items are noted in HPI. Objective:  Physical Exam: MSK: Purulent drainage with an open would on right residual knee, pink/red tissue and fluctuance, pain/tenderness around the tissue     Vitals:   02/15/23 0944  BP: (!) 164/68  Pulse: 81  Temp: (!) 97 F (36.1 C)  TempSrc: Oral  SpO2: 100%  Height: 5\' 5"  (1.651 m)    Assessment & Plan:  BKA stump complication (HCC) [name] has purulent drainage on right  residual limb as described on HPI and physical exam. She had a right BKA in 6/22 in the setting of gangrene due to severe PVD, amputation performed by Dr. Linnell Fulling. She had to undergo revision 8/22 due to infection with wound dehiscence. Given exam and sx we are concerned for abscess vs osteomyelitis and want her to be admitted to the hospital. We want her to start on IV antibiotics and get imaging. We wanted to get her direct admission to the floor, however no beds were available; advised her to go to  the ED and  direct admission ordered, however she refused to go to the ED now. [name] said she needed to be home at 2 pm to get her grand kids but reported she will come back to the ED to get admitted. We will follow up to make sure she comes back in the afternoon.   Colitis As described on HPI, [name] has been having chronic diarrhea. Given her sx and social history in setting of HIV, we want to rule out infectious cause of giardia vs cryptosporidium. She has no fever, puss or blood so she should also get stool osmolarity to rule out non inflammatory causes.  She most likely wil be admitted to the hospital, it would be best to collect her stool samples during her stay once on the floor. She also has follow up with GI July 29th.    Anxiety [name] also reports the lexapro she is on has not been helping her anxiety and panic attacks. Before increasing her dosage, she should get an EKG done to make sure she does not have QT prolongation.

## 2023-02-15 NOTE — Assessment & Plan Note (Addendum)
Ms. Kotlarz has purulent drainage on right residual limb as described on HPI and physical exam. She had a right BKA in 6/22 in the setting of gangrene due to severe PVD, amputation performed by Dr. Linnell Fulling. She had to undergo revision 8/22 due to infection with wound dehiscence. Given exam and sx we are concerned for abscess and want her to be admitted to the hospital. We wanted to get her direct admission to the floor, however no beds were available; advised her to go to the ED and  direct admission ordered, however she declined going to the ED. Ms. Schleiger said she needed to be home at 2 pm to get her grand kids but reported she will come back to the ED to get admitted. We will follow up to make sure she comes back in the afternoon.

## 2023-02-15 NOTE — H&P (Cosign Needed Addendum)
Date: 02/15/2023               Patient Name:  Kathy Frank MRN: [id number]  DOB: Feb 23, 1976 Age / Sex: 47 y.o., female   PCP: Jeral Pinch, DO         Medical Service: Internal Medicine Teaching Service         Attending Physician: Dr. Inez Catalina, MD    First Contact: Dr. Hessie Diener Pager: [phone]  Second Contact: Dr. Lily Kocher Pager: [phone]       After Hours (After 5p/  First Contact Pager: [phone]  weekends / holidays): Second Contact Pager: [phone]   Chief Complaint: right stump wound  History of Present Illness:  Pt is a 47 year old female with PMHx of ESRD on HD, Type II DM c/b gastroparesis, well controlled HIV, hx of gastric ulcers, hx of PVD s/p right BKA who presented to the clinic today on 02/15/23 for her stump wound and was directly admitted for further management.   Pt reports she developed a blister last week and burst on Friday. Pt reports it had blood and pus mixed drainage. Pt tried to squeeze the pus out and it became more painful. Describes the pain as achy and localized to the right stump. No fevers or chills.   Pt does complains of diarrhea with 3 month duration. Describes it as foul smelling watery diarrhea but non-bloody. Pt reports 4 stools per day with baseline of 1 stool per day. States her diarrhea stops for a couple of days if she takes imodium but then restarts. Has slight nausea and had one episode of vomiting last week.   Had revision of her fistula in [place] in May which resulted in infection requiring I&D, and course of antibiotics. She states diarrhea worsened with antibiotic use.   Review of Systems negative unless stated in the HPI.  Past Medical History: Past Medical History:  Diagnosis Date   Analgesic overuse headache 06/28/2021   Anemia, posthemorrhagic, acute 10/14/2022   Anxiety    CAD (coronary artery disease)    CAP (community acquired pneumonia) 09/29/2022   CVA (cerebral vascular accident) [facility])    Depression 06/28/2006    Qualifier: Diagnosis of  By: Laveda Abbe MD, Todd     Diabetes mellitus type 2 in obese 06/28/1994   Dyspnea    uses oxygeb 2 liters per minute at dialysis   End stage renal disease on dialysis [facility])    on hemodialysis T/Th/Sat   Erosive esophagitis    Esophageal reflux    Eye redness    Fecal incontinence 02/04/2018   Gastroparesis    ? diabetic   GERD (gastroesophageal reflux disease)    Human immunodeficiency virus (HIV) disease (HCC) 04/23/2016   Hyperkalemia 10/12/2022   Hyperlipidemia    Hypertension    Hypertensive emergency 11/09/2021   Hypertensive urgency 05/20/2021   Metabolic bone disease 05/02/2019   Moderate nonproliferative diabetic retinopathy of both eyes (HCC) 11/21/2014   11/14/14: Noted on retinal imaging; needs follow-up imaging in 6 months  05/22/16: Noted on retinal imaging again; needs follow-up imaging in 6 months   PAD (peripheral artery disease) (HCC)    Type 2 diabetes mellitus with diabetic peripheral angiopathy without gangrene (HCC) 05/01/2019   Wound infection s/p L transmetatarsal amputation     Meds:  Current Outpatient Medications  Medication Instructions   Accu-Chek Softclix Lancets lancets Use as instructed   acetaminophen (TYLENOL) 1,000 mg, Oral, Every 6 hours   albuterol (VENTOLIN HFA)  108 (90 Base) MCG/ACT inhaler 2 puffs, Inhalation, Every 4 hours PRN   amLODipine (NORVASC) 10 mg, Oral, Daily   atorvastatin (LIPITOR) 80 mg, Oral, Daily   bictegravir-emtricitabine-tenofovir AF (BIKTARVY) 50-200-25 MG TABS tablet 1 tablet, Oral, Daily, OVERDUE FOR OFFICE FOLLOW UP - PLEASE CALL OUR OFFICE TO SCHEDULE [phone]   bisacodyl (DULCOLAX) 10 mg, Rectal, As needed   calcitRIOL (ROCALTROL) 1.25 mcg, Oral, Every T-Th-Sa (Hemodialysis)   calcium acetate (PHOSLO) 1,334 mg, Oral, 3 times daily with meals   Continuous Blood Gluc Receiver (DEXCOM G7 RECEIVER) DEVI Use to check blood sugars cntinuously   Continuous Blood Gluc Sensor (DEXCOM G7  SENSOR) MISC Use to check blood sugar continuously   Darbepoetin Alfa (ARANESP) 200 mcg, Subcutaneous, Every Mon (1800)   diclofenac Sodium (VOLTAREN) 4 g, Topical, 4 times daily   EQ ASPIRIN ADULT LOW DOSE 81 MG tablet TAKE 1 TABLET BY MOUTH ONCE DAILY. SWALLOW WHOLE.   escitalopram (LEXAPRO) 5 mg, Oral, Daily   gabapentin (NEURONTIN) 100 mg, Oral, Daily at bedtime   glucose blood test strip Use as instructed   hydrocortisone-pramoxine (PROCTOFOAM-HC) rectal foam 1 applicator, Rectal, 2 times daily   Insulin Pen Needle (PENTIPS) 32G X 4 MM MISC Use as directed   Lantus SoloStar 15 Units, Subcutaneous, Daily at bedtime   lidocaine (LIDODERM) 5 % 1 patch, Transdermal, Every 24 hours, Remove & Discard patch within 12 hours or as directed by MD   liraglutide (VICTOZA) 0.6 mg, Subcutaneous, Daily   Lokelma 5 g, Oral, Daily   multivitamin (RENA-VIT) TABS tablet 1 tablet, Oral, Daily   ondansetron (ZOFRAN) 8 mg, Oral, Every 8 hours PRN   oxyCODONE-acetaminophen (PERCOCET) 5-325 MG tablet 1 tablet, Oral, Every 4 hours PRN   pantoprazole (PROTONIX) 40 mg, Oral, 2 times daily   polyethylene glycol (MIRALAX / GLYCOLAX) 17 g, Oral, 2 times daily   senna-docusate (SENOKOT-S) 8.6-50 MG tablet 1 tablet, Oral, Daily at bedtime   Ubrelvy 50 mg, Oral, As needed, if needed a second dose may be taken at least 2 hours after the initial dose; MAX 8 doses in a month    Allergies: NKDA  Past Surgical History: Past Surgical History:  Procedure Laterality Date   ABDOMINAL AORTOGRAM W/LOWER EXTREMITY N/A 12/13/2020   Procedure: ABDOMINAL AORTOGRAM W/LOWER EXTREMITY;  Surgeon: Chuck Hint, MD;  Location: [facility] INVASIVE CV LAB;  Service: Cardiovascular;  Laterality: N/A;   AMPUTATION Left 07/08/2018   Procedure: AMPUTATION FORTH RAY LEFT FOOT;  Surgeon: Nadara Mustard, MD;  Location: [facility] OR;  Service: Orthopedics;  Laterality: Left;   AMPUTATION Left 08/09/2018   Procedure: Left Transmetatarsal Amputation;   Surgeon: Nadara Mustard, MD;  Location: [facility] OR;  Service: Orthopedics;  Laterality: Left;   AMPUTATION Left 10/08/2018   Procedure: LEFT BELOW KNEE AMPUTATION;  Surgeon: Nadara Mustard, MD;  Location: [facility] OR;  Service: Orthopedics;  Laterality: Left;   AMPUTATION Left 10/28/2018   Procedure: REVISION BELOW KNEE AMPUTATION;  Surgeon: Nadara Mustard, MD;  Location: [facility] OR;  Service: Orthopedics;  Laterality: Left;   AMPUTATION Left 12/16/2018   Procedure: LEFT ABOVE KNEE AMPUTATION;  Surgeon: Nadara Mustard, MD;  Location: [facility] OR;  Service: Orthopedics;  Laterality: Left;   AMPUTATION Right 01/31/2021   Procedure: AMPUTATION BELOW KNEE;  Surgeon: Nada Libman, MD;  Location: [facility] OR;  Service: Vascular;  Laterality: Right;   AMPUTATION Right 03/28/2021   Procedure: REVISION OF RIGHT BELOW KNEE AMPUTATION;  Surgeon: Nada Libman, MD;  Location: [facility] OR;  Service: Vascular;  Laterality: Right;   AMPUTATION Right 04/18/2021   Procedure: right below knee amputation/washout placement wound vac;  Surgeon: Leonie Douglas, MD;  Location: [facility] OR;  Service: Vascular;  Laterality: Right;   APPLICATION OF WOUND VAC Left 12/31/2022   Procedure: APPLICATION OF PREVENA WOUND VAC;  Surgeon: Victorino Sparrow, MD;  Location: [facility] OR;  Service: Vascular;  Laterality: Left;   AV FISTULA PLACEMENT Left 10/14/2018   Procedure: Arteriovenous (Av) Fistula Creation Left Arm;  Surgeon: Cephus Shelling, MD;  Location: [facility] OR;  Service: Vascular;  Laterality: Left;   BASCILIC VEIN TRANSPOSITION Left 04/14/2019   Procedure: BASILIC VEIN TRANSPOSITION SECOND STAGE LEFT ARM;  Surgeon: Larina Earthly, MD;  Location: [facility] OR;  Service: Vascular;  Laterality: Left;   BIOPSY  10/14/2022   Procedure: BIOPSY;  Surgeon: Benancio Deeds, MD;  Location: [facility] ENDOSCOPY;  Service: Gastroenterology;;   COMPLEX WOUND CLOSURE Left 12/31/2022   Procedure: COMPLEX WOUND CLOSURE;  Surgeon: Victorino Sparrow, MD;  Location: [facility] OR;  Service: Vascular;   Laterality: Left;   CORONARY STENT INTERVENTION N/A 05/21/2021   Procedure: CORONARY STENT INTERVENTION;  Surgeon: Marykay Lex, MD;  [facility];  Service: Cardiovascular;  Laterality: N/A;   ESOPHAGOGASTRODUODENOSCOPY (EGD) WITH PROPOFOL N/A 10/14/2022   Procedure: ESOPHAGOGASTRODUODENOSCOPY (EGD) WITH PROPOFOL;  Surgeon: Benancio Deeds, MD;  Location: [facility] ENDOSCOPY;  Service: Gastroenterology;  Laterality: N/A;   HEMOSTASIS CLIP PLACEMENT  10/14/2022   Procedure: HEMOSTASIS CLIP PLACEMENT;  Surgeon: Benancio Deeds, MD;  Location: [facility] ENDOSCOPY;  Service: Gastroenterology;;   INCISION AND DRAINAGE Left 12/31/2022   Procedure: INCISION AND DRAINAGE OF LEFT ARM;  Surgeon: Victorino Sparrow, MD;  Location: [facility] OR;  Service: Vascular;  Laterality: Left;   INCISION AND DRAINAGE ABSCESS N/A 10/24/2020   Procedure: exicision of hydradenitis;  Surgeon: Romie Levee, MD;  Location: [facility];  Service: General;  Laterality: N/A;  45 min   INSERTION OF DIALYSIS CATHETER Right 04/14/2019   Procedure: INSERTION OF DIALYSIS CATHETER;  Surgeon: Larina Earthly, MD;  Location: [facility] OR;  Service: Vascular;  Laterality: Right;   IR FLUORO GUIDE CV LINE RIGHT  10/13/2022   IR REMOVAL TUN CV CATH W/O FL  10/16/2022   IR US GUIDE VASC ACCESS RIGHT  10/13/2022   LEFT HEART CATH AND CORONARY ANGIOGRAPHY N/A 05/21/2021   Procedure: LEFT HEART CATH AND CORONARY ANGIOGRAPHY;  Surgeon: Marykay Lex, MD;  Location: [facility] INVASIVE CV LAB;  Service: Cardiovascular;  Laterality: N/A;   LOWER EXTREMITY ANGIOGRAPHY N/A 07/05/2018   Procedure: LOWER EXTREMITY ANGIOGRAPHY;  Surgeon: Nada Libman, MD;  Location: [facility] INVASIVE CV LAB;  Service: Cardiovascular;  Laterality: N/A;   PERIPHERAL VASCULAR ATHERECTOMY Right 12/13/2020   Procedure: PERIPHERAL VASCULAR ATHERECTOMY;  Surgeon: Chuck Hint, MD;  Location: [facility] INVASIVE CV LAB;  Service: Cardiovascular;  Laterality: Right;  Superficial  femoral   PERIPHERAL VASCULAR BALLOON ANGIOPLASTY Left 07/05/2018   Procedure: PERIPHERAL VASCULAR BALLOON ANGIOPLASTY;  Surgeon: Nada Libman, MD;  Location: [facility] INVASIVE CV LAB;  Service: Cardiovascular;  Laterality: Left;  SFA   PERIPHERAL VASCULAR BALLOON ANGIOPLASTY Right 12/13/2020   Procedure: PERIPHERAL VASCULAR BALLOON ANGIOPLASTY;  Surgeon: Chuck Hint, MD;  Location: [facility] INVASIVE CV LAB;  Service: Cardiovascular;  Laterality: Right;  Peroneal artery, anterior tibial artery   RECTAL EXAM UNDER ANESTHESIA N/A 10/24/2020   Procedure: EXAM UNDER ANESTHESIA;  Surgeon: Romie Levee,  MD;  Location: [facility];  Service: General;  Laterality: N/A;   STUMP REVISION Left 10/19/2018   Procedure: REVISION LEFT BELOW KNEE AMPUTATION;  Surgeon: Nadara Mustard, MD;  Location: [facility] OR;  Service: Orthopedics;  Laterality: Left;   TUBAL LIGATION  2002    Family History:  Family History  Problem Relation Age of Onset   Diabetes Mother    Diabetes Brother    Diabetes Daughter    Diabetes Daughter    Mental retardation Brother        died from PNA   Diabetes Maternal Grandmother     Social History: Lives with daughter and grandson. No pets. Former smoker but stopped in 2016. Smoked for 10 years once pack per day. Former alcohol use but non currently. No current illicit drug use. Can do all ADLs but needs help with iADLs.   Physical Exam: Blood pressure (!) 160/79, pulse 92, temperature 98.2 F (36.8 C), temperature source Oral, resp. rate 16, weight 75.5 kg, SpO2 100 %. General: NAD, very pleasant HENT: NCAT, PERRL Lungs: CTAB Cardiovascular: NSR, good radial pulses  Abdomen: No TTP, normal bowel sounds MSK: AKA on left, BKA on right with wound dressing Skin: wound on right BKA with slight pus draining Neuro: alert and oriented Psych: normal mood and normal affect  Diagnostics:     Latest Ref Rng & Units 12/31/2022   11:41 AM 12/25/2022    9:05 AM 12/24/2022     7:32 AM  CBC  WBC 4.0 - 10.5 K/uL  5.5  4.1   Hemoglobin 12.0 - 15.0 g/dL 78.2  9.1  6.8   Hematocrit 36.0 - 46.0 % 33.0  28.6  22.2   Platelets 150 - 400 K/uL  238  234        Latest Ref Rng & Units 12/31/2022   11:41 AM 12/25/2022    9:05 AM 12/24/2022    7:32 AM  CMP  Glucose 70 - 99 mg/dL 956  213  78   BUN 6 - 20 mg/dL 15  19  36   Creatinine 0.44 - 1.00 mg/dL 0.86  5.78  4.69   Sodium 135 - 145 mmol/L 134  133  136   Potassium 3.5 - 5.1 mmol/L 5.0  4.5  4.5   Chloride 98 - 111 mmol/L 104  94  98   CO2 22 - 32 mmol/L  24  19   Calcium 8.9 - 10.3 mg/dL  8.3  8.4      Assessment & Plan by Problem:  Present on Admission:  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)    Infection of Right Lower Extremity Stump Pt direct admit from the clinic for suspected soft tissue infection vs osteomyelitis. Wound does have slight pus drainage and is erythematous and tender to palpation. Pt states pain is worsening. Overall is is HDS, is afebrile and appears well. Plan is to get wound and blood cultures and start IV Vancomycin and Ceftriaxone. The antibiotics can be tailored once culture result come back. Pt does not appear to be bacteremic in appearance so wound culture result might be most helpful.  -Fu MRI -Fu Wound and Blood Cx -Start IV Vanc and Cefepime  Chronic Diarrhea Pt states this has been going on for 3 months. But on chart review pt was discharged on aggressive bowel regimen given her constipation on 12/25/22. I wonder if she is inadvertently taking laxatives. Thankfully she has no TTP of abdomen, and no  bloody diarrhea. C. Diff is a likely cause given worsening with abx use, so will check C. Diff antigen. Will also plan to get GI panel to look for other causes. Will follow up order labs and if negative then investigate further.   Chronic Problems ESRD on HD: Nephrology consult for HD tmrw per schedule.  HTN: On amlodipine-Olmesartan combo pill recently started  by nephrology. Will continue here. Pt took her home med prior to coming to the hospital. Will give it time to work and can use oral hydralazine prn.  DMII: Will start 7 units of long acting insulin and place on SSI. Pt has GI follow up coming up for her gastroparesis.  HIV: On Biktarvy 50-200-25 mg qd. CD4 count 397 and RNA undetected.    DVT prophx: Lovenox Diet: HH/Carb Bowel: PRN Code:Full  Prior to Admission Living Arrangement: Home Anticipated Discharge Location: Home Barriers to Discharge: Medical Workup  Dispo: Admit patient to Inpatient with expected length of stay greater than 2 midnights.  Gwenevere Abbot, MD Eligha Bridegroom. [facility] Internal Medicine Residency, PGY-3 Pager: [phone]

## 2023-02-15 NOTE — Assessment & Plan Note (Addendum)
Kathy Frank has been having chronic diarrhea for last 3 months. Given her sx and social history in setting of HIV, we want to rule out infectious cause of giardia vs cryptosporidium. She has no fever, pus or blood so she should also get stool osmolarity to rule out non inflammatory causes.  She most likely wil be admitted to the hospital, it would be best to collect her stool samples during her stay once on the floor. She also has follow up with GI July 29th.

## 2023-02-15 NOTE — Assessment & Plan Note (Addendum)
Kathy Frank also reports the lexapro she is on has not been helping her anxiety and panic attacks. In setting of ESRD, before increasing her dosage, she should get an EKG done to make sure she does not have QT prolongation.

## 2023-02-15 NOTE — Patient Instructions (Signed)
Thank you, Ms.[name] for allowing [place] to provide your care today.  I am worried about a deeper infection with the wound on your right leg. I am sending you for further evaluation in the ED. They will get imaging and start IV antibiotics.  Diarrhea- if you are admitted to hospital we can get stool studies there.  Panic attacks- we will need to check your heart rhythm prior to changing dose of lexapro.   I have ordered the following medication/changed the following medications:   Stop the following medications: There are no discontinued medications.   Start the following medications: No orders of the defined types were placed in this encounter.    We look forward to seeing you next time. Please call our clinic at [phone] if you have any questions or concerns. The best time to call is Monday-Friday from 9am-4pm, but there is someone available 24/7. If after hours or the weekend, call the main hospital number and ask for the Internal Medicine Resident On-Call. If you need medication refills, please notify your pharmacy one week in advance and they will send [place] a request.   Thank you for trusting me with your care. Wishing you the best!   Rudene Christians, DO [facility]

## 2023-02-16 ENCOUNTER — Other Ambulatory Visit (HOSPITAL_COMMUNITY): Payer: Self-pay

## 2023-02-16 ENCOUNTER — Encounter (HOSPITAL_COMMUNITY): Payer: Self-pay

## 2023-02-16 ENCOUNTER — Inpatient Hospital Stay (HOSPITAL_COMMUNITY): Payer: Medicare Other

## 2023-02-16 DIAGNOSIS — T8743 Infection of amputation stump, right lower extremity: Secondary | ICD-10-CM | POA: Diagnosis not present

## 2023-02-16 DIAGNOSIS — E1122 Type 2 diabetes mellitus with diabetic chronic kidney disease: Secondary | ICD-10-CM

## 2023-02-16 DIAGNOSIS — Z95828 Presence of other vascular implants and grafts: Secondary | ICD-10-CM | POA: Insufficient documentation

## 2023-02-16 DIAGNOSIS — N186 End stage renal disease: Secondary | ICD-10-CM | POA: Diagnosis not present

## 2023-02-16 DIAGNOSIS — B20 Human immunodeficiency virus [HIV] disease: Secondary | ICD-10-CM

## 2023-02-16 DIAGNOSIS — I120 Hypertensive chronic kidney disease with stage 5 chronic kidney disease or end stage renal disease: Secondary | ICD-10-CM

## 2023-02-16 DIAGNOSIS — K529 Noninfective gastroenteritis and colitis, unspecified: Secondary | ICD-10-CM

## 2023-02-16 DIAGNOSIS — Z992 Dependence on renal dialysis: Secondary | ICD-10-CM

## 2023-02-16 LAB — RENAL FUNCTION PANEL
Albumin: 2.6 g/dL — ABNORMAL LOW (ref 3.5–5.0)
Anion gap: 16 — ABNORMAL HIGH (ref 5–15)
BUN: 39 mg/dL — ABNORMAL HIGH (ref 6–20)
CO2: 19 mmol/L — ABNORMAL LOW (ref 22–32)
Calcium: 8.2 mg/dL — ABNORMAL LOW (ref 8.9–10.3)
Chloride: 101 mmol/L (ref 98–111)
Creatinine, Ser: 10.09 mg/dL — ABNORMAL HIGH (ref 0.44–1.00)
GFR, Estimated: 4 mL/min — ABNORMAL LOW (ref >60)
Glucose, Bld: 183 mg/dL — ABNORMAL HIGH (ref 70–99)
Phosphorus: 6.9 mg/dL — ABNORMAL HIGH (ref 2.5–4.6)
Potassium: 4.4 mmol/L (ref 3.5–5.1)
Sodium: 136 mmol/L (ref 135–145)

## 2023-02-16 LAB — GLUCOSE, CAPILLARY
Glucose-Capillary: 128 mg/dL — ABNORMAL HIGH (ref 70–99)
Glucose-Capillary: 118 mg/dL — ABNORMAL HIGH (ref 70–99)
Glucose-Capillary: 172 mg/dL — ABNORMAL HIGH (ref 70–99)

## 2023-02-16 LAB — CBC
HCT: 27.5 % — ABNORMAL LOW (ref 36.0–46.0)
Hemoglobin: 8.3 g/dL — ABNORMAL LOW (ref 12.0–15.0)
MCH: 26.9 pg (ref 26.0–34.0)
MCHC: 30.2 g/dL (ref 30.0–36.0)
MCV: 89.3 fL (ref 80.0–100.0)
Platelets: 297 10*3/uL (ref 150–400)
RBC: 3.08 MIL/uL — ABNORMAL LOW (ref 3.87–5.11)
RDW: 16.5 % — ABNORMAL HIGH (ref 11.5–15.5)
WBC: 5.3 10*3/uL (ref 4.0–10.5)
nRBC: 0 % (ref 0.0–0.2)

## 2023-02-16 LAB — T-HELPER CELLS (CD4) COUNT (NOT AT ARMC)
CD4 % Helper T Cell: 35 % (ref 33–65)
CD4 T Cell Abs: 427 /uL (ref 400–1790)

## 2023-02-16 LAB — C DIFFICILE QUICK SCREEN W PCR REFLEX
C Diff antigen: NEGATIVE
C Diff interpretation: NOT DETECTED
C Diff toxin: NEGATIVE

## 2023-02-16 LAB — HEPATITIS B SURFACE ANTIBODY, QUANTITATIVE: Hep B S AB Quant (Post): 424 m[IU]/mL

## 2023-02-16 LAB — SEDIMENTATION RATE: Sed Rate: 44 mm/hr — ABNORMAL HIGH (ref 0–22)

## 2023-02-16 MED ORDER — HYDROMORPHONE HCL 1 MG/ML IJ SOLN
1.0000 mg | Freq: Four times a day (QID) | INTRAMUSCULAR | Status: DC
  Administered 2023-02-16 – 2023-02-19 (×6): 1 mg via INTRAVENOUS
  Filled 2023-02-16 (×8): qty 1

## 2023-02-16 MED ORDER — PENTAFLUOROPROP-TETRAFLUOROETH EX AERO: 1.0000 | INHALATION_SPRAY | CUTANEOUS | Status: DC | PRN

## 2023-02-16 MED ORDER — CALCIUM ACETATE (PHOS BINDER) 667 MG PO CAPS
1334.0000 mg | ORAL_CAPSULE | Freq: Three times a day (TID) | ORAL | Status: DC
  Administered 2023-02-16 – 2023-02-22 (×9): 1334 mg via ORAL
  Filled 2023-02-16 (×12): qty 2

## 2023-02-16 MED ORDER — CHLORHEXIDINE GLUCONATE CLOTH 2 % EX PADS
6.0000 | MEDICATED_PAD | Freq: Every day | CUTANEOUS | Status: DC
  Administered 2023-02-16: 6 via TOPICAL

## 2023-02-16 MED ORDER — BICTEGRAVIR-EMTRICITAB-TENOFOV 50-200-25 MG PO TABS
1.0000 | ORAL_TABLET | Freq: Every day | ORAL | Status: DC
  Administered 2023-02-16 – 2023-02-21 (×3): 1 via ORAL
  Filled 2023-02-16 (×8): qty 1

## 2023-02-16 MED ORDER — LIDOCAINE-PRILOCAINE 2.5-2.5 % EX CREA: 1.0000 | TOPICAL_CREAM | CUTANEOUS | Status: DC | PRN

## 2023-02-16 MED ORDER — GADOBUTROL 1 MMOL/ML IV SOLN
7.0000 mL | Freq: Once | INTRAVENOUS | Status: AC | PRN
  Administered 2023-02-16: 7 mL via INTRAVENOUS

## 2023-02-16 MED ORDER — ALTEPLASE 2 MG IJ SOLR: 2.0000 mg | Freq: Once | INTRAMUSCULAR | Status: DC | PRN

## 2023-02-16 MED ORDER — CALCITRIOL 0.5 MCG PO CAPS
1.0000 ug | ORAL_CAPSULE | ORAL | Status: DC
  Administered 2023-02-16 – 2023-02-20 (×3): 1 ug via ORAL
  Filled 2023-02-16 (×3): qty 2

## 2023-02-16 MED ORDER — LIDOCAINE HCL (PF) 1 % IJ SOLN: 5.0000 mL | INTRAMUSCULAR | Status: DC | PRN

## 2023-02-16 MED ORDER — HEPARIN SODIUM (PORCINE) 1000 UNIT/ML DIALYSIS
1000.0000 [IU] | INTRAMUSCULAR | Status: DC | PRN
  Administered 2023-02-16: 3200 [IU]
  Filled 2023-02-16: qty 1

## 2023-02-16 MED ORDER — HYDROMORPHONE HCL 1 MG/ML IJ SOLN
1.0000 mg | Freq: Once | INTRAMUSCULAR | Status: AC
  Administered 2023-02-16: 1 mg via INTRAVENOUS
  Filled 2023-02-16: qty 1

## 2023-02-16 MED ORDER — BICTEGRAVIR-EMTRICITAB-TENOFOV 50-200-25 MG PO TABS
1.0000 | ORAL_TABLET | Freq: Every day | ORAL | Status: DC
  Filled 2023-02-16: qty 1

## 2023-02-16 MED ORDER — ANTICOAGULANT SODIUM CITRATE 4% (200MG/5ML) IV SOLN: 5.0000 mL | Status: DC | PRN

## 2023-02-16 MED ORDER — HEPARIN SODIUM (PORCINE) 1000 UNIT/ML IJ SOLN
INTRAMUSCULAR | Status: AC
  Filled 2023-02-16: qty 4

## 2023-02-16 MED ORDER — DARBEPOETIN ALFA 150 MCG/0.3ML IJ SOSY
150.0000 ug | PREFILLED_SYRINGE | INTRAMUSCULAR | Status: DC
  Administered 2023-02-16: 150 ug via SUBCUTANEOUS
  Filled 2023-02-16: qty 0.3

## 2023-02-16 NOTE — Progress Notes (Addendum)
Received patient in bed.Awake,alert and oriented x 4.She signed her consent for treatment.  Access used :Right HD catheter that worked well.Dressing on date.  Medicine given: Hydromorphone 1 mg IV.  Duration of treatment : 4 hours.  Fluid removed : 4 liters.  Hemo comment: Tolerated treatment well.  Hand off to the patient's nurse.

## 2023-02-16 NOTE — Consult Note (Addendum)
Hospital Consult    Reason for Consult:  infection right BKA Requesting Physician:  Lily Kocher MRN #:  [id number]  History of Present Illness: This is a 47 y.o. female who presented to the hospital with pain and drainage from her right BKA stump.  She recently had I&D for infection at dialysis access left arm 12/31/2022.  Of note, she had revision of her fistula in [place] in May, which resulted in the infection requiring I&D and abx.   She had developed a blister on the stump and this burst on Friday last week.  Reported drainage with some pus.  It has been red and tender with worsening pain.  An MRI was obtained and this revealed acute osteomyelitis involving the distal aspect of the residual fibula and subtle marrow edema along the idstal resection margin of the tibia, which may reflect reactive osteitis or early acute osteomyelitis.  Vascular surgery is consulted.  Pt seen with Dr. Randie Heinz in the dialysis unit.  Pt is not awake enough to answer questions therefore, information is obtained from the chart.   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 Chestine Spore -aortogram RLE with angioplasty right ATA atherectomy and angioplasty right SFA and popliteal 12/13/2020 Dr. 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 Myra Gianotti (right foot ulcer) -revision right BKA via redo BKA 03/28/2021 Dr. Myra Gianotti -I&D right BKA stump with vac 04/18/2021 Dr. Lenell Antu -left arm I&D for infected dialysis access 12/31/2022 Dr. Karin Lieu  Pt has PMH significant for ESRD on HD, Type II DM c/b gastroparesis, well controlled HIV, hx of gastric ulcers.  The pt is on a statin for cholesterol management.  The pt is on a daily aspirin.   Other AC:  sq heparin  The pt is on CCB , ARB for hypertension.   The pt is is on medication for diabetes PTA. Tobacco hx:  former  Past Medical History:  Diagnosis Date   Analgesic overuse headache 06/28/2021   Anemia, posthemorrhagic, acute 10/14/2022   Anxiety    CAD (coronary artery disease)     CAP (community acquired pneumonia) 09/29/2022   CVA (cerebral vascular accident) [facility])    Depression 06/28/2006   Qualifier: Diagnosis of  By: Laveda Abbe MD, Todd     Diabetes mellitus type 2 in obese 06/28/1994   Dyspnea    uses oxygeb 2 liters per minute at dialysis   End stage renal disease on dialysis [facility])    on hemodialysis T/Th/Sat   Erosive esophagitis    Esophageal reflux    Eye redness    Fecal incontinence 02/04/2018   Gastroparesis    ? diabetic   GERD (gastroesophageal reflux disease)    Human immunodeficiency virus (HIV) disease (HCC) 04/23/2016   Hyperkalemia 10/12/2022   Hyperlipidemia    Hypertension    Hypertensive emergency 11/09/2021   Hypertensive urgency 05/20/2021   Metabolic bone disease 05/02/2019   Moderate nonproliferative diabetic retinopathy of both eyes (HCC) 11/21/2014   11/14/14: Noted on retinal imaging; needs follow-up imaging in 6 months  05/22/16: Noted on retinal imaging again; needs follow-up imaging in 6 months   PAD (peripheral artery disease) (HCC)    Type 2 diabetes mellitus with diabetic peripheral angiopathy without gangrene (HCC) 05/01/2019   Wound infection s/p L transmetatarsal amputation     Past Surgical History:  Procedure Laterality Date   ABDOMINAL AORTOGRAM W/LOWER EXTREMITY N/A 12/13/2020   Procedure: ABDOMINAL AORTOGRAM W/LOWER EXTREMITY;  Surgeon: Chuck Hint, MD;  Location: [facility] INVASIVE CV LAB;  Service: Cardiovascular;  Laterality: N/A;  AMPUTATION Left 07/08/2018   Procedure: AMPUTATION FORTH RAY LEFT FOOT;  Surgeon: Nadara Mustard, MD;  Location: [facility] OR;  Service: Orthopedics;  Laterality: Left;   AMPUTATION Left 08/09/2018   Procedure: Left Transmetatarsal Amputation;  Surgeon: Nadara Mustard, MD;  Location: [facility] OR;  Service: Orthopedics;  Laterality: Left;   AMPUTATION Left 10/08/2018   Procedure: LEFT BELOW KNEE AMPUTATION;  Surgeon: Nadara Mustard, MD;  Location: [facility] OR;  Service: Orthopedics;  Laterality: Left;    AMPUTATION Left 10/28/2018   Procedure: REVISION BELOW KNEE AMPUTATION;  Surgeon: Nadara Mustard, MD;  Location: [facility] OR;  Service: Orthopedics;  Laterality: Left;   AMPUTATION Left 12/16/2018   Procedure: LEFT ABOVE KNEE AMPUTATION;  Surgeon: Nadara Mustard, MD;  Location: [facility] OR;  Service: Orthopedics;  Laterality: Left;   AMPUTATION Right 01/31/2021   Procedure: AMPUTATION BELOW KNEE;  Surgeon: Nada Libman, MD;  Location: [facility] OR;  Service: Vascular;  Laterality: Right;   AMPUTATION Right 03/28/2021   Procedure: REVISION OF RIGHT BELOW KNEE AMPUTATION;  Surgeon: Nada Libman, MD;  Location: [facility] OR;  Service: Vascular;  Laterality: Right;   AMPUTATION Right 04/18/2021   Procedure: right below knee amputation/washout placement wound vac;  Surgeon: Leonie Douglas, MD;  Location: [facility] OR;  Service: Vascular;  Laterality: Right;   APPLICATION OF WOUND VAC Left 12/31/2022   Procedure: APPLICATION OF PREVENA WOUND VAC;  Surgeon: Victorino Sparrow, MD;  Location: [facility] OR;  Service: Vascular;  Laterality: Left;   AV FISTULA PLACEMENT Left 10/14/2018   Procedure: Arteriovenous (Av) Fistula Creation Left Arm;  Surgeon: Cephus Shelling, MD;  Location: [facility] OR;  Service: Vascular;  Laterality: Left;   BASCILIC VEIN TRANSPOSITION Left 04/14/2019   Procedure: BASILIC VEIN TRANSPOSITION SECOND STAGE LEFT ARM;  Surgeon: Larina Earthly, MD;  Location: [facility] OR;  Service: Vascular;  Laterality: Left;   BIOPSY  10/14/2022   Procedure: BIOPSY;  Surgeon: Benancio Deeds, MD;  Location: [facility] ENDOSCOPY;  Service: Gastroenterology;;   COMPLEX WOUND CLOSURE Left 12/31/2022   Procedure: COMPLEX WOUND CLOSURE;  Surgeon: Victorino Sparrow, MD;  Location: [facility] OR;  Service: Vascular;  Laterality: Left;   CORONARY STENT INTERVENTION N/A 05/21/2021   Procedure: CORONARY STENT INTERVENTION;  Surgeon: Marykay Lex, MD;  Location: [facility] INVASIVE CV LAB;  Service: Cardiovascular;  Laterality: N/A;   ESOPHAGOGASTRODUODENOSCOPY (EGD) WITH  PROPOFOL N/A 10/14/2022   Procedure: ESOPHAGOGASTRODUODENOSCOPY (EGD) WITH PROPOFOL;  Surgeon: Benancio Deeds, MD;  Location: [facility] ENDOSCOPY;  Service: Gastroenterology;  Laterality: N/A;   HEMOSTASIS CLIP PLACEMENT  10/14/2022   Procedure: HEMOSTASIS CLIP PLACEMENT;  Surgeon: Benancio Deeds, MD;  Location: [facility] ENDOSCOPY;  Service: Gastroenterology;;   INCISION AND DRAINAGE Left 12/31/2022   Procedure: INCISION AND DRAINAGE OF LEFT ARM;  Surgeon: Victorino Sparrow, MD;  Location: [facility] OR;  Service: Vascular;  Laterality: Left;   INCISION AND DRAINAGE ABSCESS N/A 10/24/2020   Procedure: exicision of hydradenitis;  Surgeon: Romie Levee, MD;  Location: [facility];  Service: General;  Laterality: N/A;  45 min   INSERTION OF DIALYSIS CATHETER Right 04/14/2019   Procedure: INSERTION OF DIALYSIS CATHETER;  Surgeon: Larina Earthly, MD;  Location: [facility] OR;  Service: Vascular;  Laterality: Right;   IR FLUORO GUIDE CV LINE RIGHT  10/13/2022   IR REMOVAL TUN CV CATH W/O FL  10/16/2022   IR US GUIDE VASC ACCESS RIGHT  10/13/2022   LEFT HEART CATH AND CORONARY ANGIOGRAPHY  N/A 05/21/2021   Procedure: LEFT HEART CATH AND CORONARY ANGIOGRAPHY;  Surgeon: Marykay Lex, MD;  Location: [facility] INVASIVE CV LAB;  Service: Cardiovascular;  Laterality: N/A;   LOWER EXTREMITY ANGIOGRAPHY N/A 07/05/2018   Procedure: LOWER EXTREMITY ANGIOGRAPHY;  Surgeon: Nada Libman, MD;  Location: [facility] INVASIVE CV LAB;  Service: Cardiovascular;  Laterality: N/A;   PERIPHERAL VASCULAR ATHERECTOMY Right 12/13/2020   Procedure: PERIPHERAL VASCULAR ATHERECTOMY;  Surgeon: Chuck Hint, MD;  Location: [facility] INVASIVE CV LAB;  Service: Cardiovascular;  Laterality: Right;  Superficial femoral   PERIPHERAL VASCULAR BALLOON ANGIOPLASTY Left 07/05/2018   Procedure: PERIPHERAL VASCULAR BALLOON ANGIOPLASTY;  Surgeon: Nada Libman, MD;  Location: [facility] INVASIVE CV LAB;  Service: Cardiovascular;  Laterality: Left;  SFA   PERIPHERAL  VASCULAR BALLOON ANGIOPLASTY Right 12/13/2020   Procedure: PERIPHERAL VASCULAR BALLOON ANGIOPLASTY;  Surgeon: Chuck Hint, MD;  Location: [facility] INVASIVE CV LAB;  Service: Cardiovascular;  Laterality: Right;  Peroneal artery, anterior tibial artery   RECTAL EXAM UNDER ANESTHESIA N/A 10/24/2020   Procedure: EXAM UNDER ANESTHESIA;  Surgeon: Romie Levee, MD;  Location: [facility];  Service: General;  Laterality: N/A;   STUMP REVISION Left 10/19/2018   Procedure: REVISION LEFT BELOW KNEE AMPUTATION;  Surgeon: Nadara Mustard, MD;  Location: [facility] OR;  Service: Orthopedics;  Laterality: Left;   TUBAL LIGATION  2002    No Known Allergies  Prior to Admission medications   Medication Sig Start Date End Date Taking? Authorizing Provider  Accu-Chek Softclix Lancets lancets Use as instructed 05/02/21   Atway, Rayann N, DO  acetaminophen (TYLENOL) 500 MG tablet Take 2 tablets (1,000 mg total) by mouth every 6 (six) hours. 12/25/22   Marrianne Mood, MD  albuterol (VENTOLIN HFA) 108 (90 Base) MCG/ACT inhaler Inhale 2 puffs into the lungs every 4 (four) hours as needed for wheezing or shortness of breath. 02/14/21   Angiulli, Mcarthur Rossetti, PA-C  amLODipine (NORVASC) 10 MG tablet Take 1 tablet (10 mg total) by mouth daily. 10/17/22   Rocky Morel, DO  amLODipine-olmesartan (AZOR) 10-40 MG tablet Take 1 tablet by mouth every evening. 02/09/23   [provider]  atorvastatin (LIPITOR) 80 MG tablet Take 1 tablet (80 mg total) by mouth daily. 02/01/23   Willette Cluster, MD  bictegravir-emtricitabine-tenofovir AF (BIKTARVY) 50-200-25 MG TABS tablet Take 1 tablet by mouth daily. OVERDUE FOR OFFICE FOLLOW UP - PLEASE CALL OUR OFFICE TO SCHEDULE [phone] Patient taking differently: Take 1 tablet by mouth daily. 08/26/22   Cliffton Asters, MD  bisacodyl (DULCOLAX) 10 MG suppository Place 1 suppository (10 mg total) rectally as needed for moderate constipation. 12/25/22   Marrianne Mood, MD   calcitRIOL (ROCALTROL) 0.25 MCG capsule Take 5 capsules (1.25 mcg total) by mouth Every Tuesday,Thursday,and Saturday with dialysis. 07/02/22   Marolyn Haller, MD  calcium acetate (PHOSLO) 667 MG capsule Take 2 capsules (1,334 mg total) by mouth 3 (three) times daily with meals. 06/30/22   Marolyn Haller, MD  Continuous Blood Gluc Receiver (DEXCOM G7 RECEIVER) DEVI Use to check blood sugars cntinuously Patient not taking: Reported on 02/03/2023 06/30/22   Marolyn Haller, MD  Continuous Blood Gluc Sensor (DEXCOM G7 SENSOR) MISC Use to check blood sugar continuously Patient not taking: Reported on 02/03/2023 06/30/22   Marolyn Haller, MD  cyclobenzaprine (FLEXERIL) 5 MG tablet Take 5 mg by mouth 2 (two) times daily as needed for muscle spasms.    [provider]  Darbepoetin Alfa (ARANESP) 200 MCG/0.4ML SOSY injection Inject  0.4 mLs (200 mcg total) into the skin every Monday at 6 PM. Patient taking differently: Inject 200 mcg into the skin every 7 (seven) days. Every Monday 07/06/22   Marolyn Haller, MD  diclofenac Sodium (VOLTAREN) 1 % GEL Apply 4 g topically 4 (four) times daily. Patient taking differently: Apply 4 g topically daily as needed (for pain). 10/02/22   Adron Bene, MD  EQ ASPIRIN ADULT LOW DOSE 81 MG tablet TAKE 1 TABLET BY MOUTH ONCE DAILY. SWALLOW WHOLE. Patient taking differently: Take 81 mg by mouth daily. 02/06/22   Maeola Harman, MD  escitalopram (LEXAPRO) 5 MG tablet Take 1 tablet (5 mg total) by mouth daily. 02/01/23   Willette Cluster, MD  gabapentin (NEURONTIN) 100 MG capsule Take 100 mg by mouth at bedtime. 09/10/22   [provider]  glucose blood test strip Use as instructed 05/02/21   Atway, Rayann N, DO  hydrocortisone-pramoxine (PROCTOFOAM-HC) rectal foam Place 1 applicator rectally 2 (two) times daily. 11/07/22   Dione Booze, MD  insulin glargine (LANTUS SOLOSTAR) 100 UNIT/ML Solostar Pen Inject 15 Units into the skin at  bedtime. 02/01/23   Willette Cluster, MD  Insulin Pen Needle (PENTIPS) 32G X 4 MM MISC Use as directed 11/10/22   Quincy Simmonds, MD  lidocaine (LIDODERM) 5 % Place 1 patch onto the skin daily. Remove & Discard patch within 12 hours or as directed by MD Patient taking differently: Place 1 patch onto the skin daily as needed (For pain). 10/03/22   Adron Bene, MD  liraglutide (VICTOZA) 18 MG/3ML SOPN Inject 0.6 mg into the skin daily. 11/03/22 10/29/23  Adron Bene, MD  LOKELMA 5 g packet Take 5 g by mouth daily. 07/30/22   [provider]  multivitamin (RENA-VIT) TABS tablet Take 1 tablet by mouth daily. Patient taking differently: Take 1 tablet by mouth See admin instructions. On Tuesday, Wednesday and Thursday 02/14/21   Angiulli, Mcarthur Rossetti, PA-C  ondansetron (ZOFRAN) 8 MG tablet Take 1 tablet (8 mg total) by mouth every 8 (eight) hours as needed for nausea or vomiting. 12/25/22   Marrianne Mood, MD  oxyCODONE-acetaminophen (PERCOCET) 5-325 MG tablet Take 1 tablet by mouth every 4 (four) hours as needed for severe pain. 12/31/22 12/31/23  Emilie Rutter, PA-C  pantoprazole (PROTONIX) 40 MG tablet Take 1 tablet (40 mg total) by mouth 2 (two) times daily. 12/25/22   Marrianne Mood, MD  polyethylene glycol (MIRALAX / GLYCOLAX) 17 g packet Take 17 g by mouth 2 (two) times daily. 12/25/22   Marrianne Mood, MD  senna-docusate (SENOKOT-S) 8.6-50 MG tablet Take 1 tablet by mouth at bedtime. 12/25/22   Marrianne Mood, MD  Ubrogepant (UBRELVY) 50 MG TABS Take 1 tablet (50 mg total) by mouth as needed (migraine headache). if needed a second dose may be taken at least 2 hours after the initial dose; MAX 8 doses in a month 01/25/23   Quincy Simmonds, MD    Social History   Socioeconomic History   Marital status: Single    Spouse name: Not on file   Number of children: Not on file   Years of education: 10   Highest education level: Not on file  Occupational History    Employer: UNEMPLOYED   Tobacco Use   Smoking status: Former    Packs/day: 0.10    Years: 10.00    Additional pack years: 0.00    Total pack years: 1.00    Types: Cigarettes    Start date: 11/17/2013  Quit date: 09/09/2014    Years since quitting: 8.4   Smokeless tobacco: Never  Vaping Use   Vaping Use: Never used  Substance and Sexual Activity   Alcohol use: Not Currently   Drug use: Yes    Types: Marijuana    Comment: 2 times a monthy -  `last time [place] 2022   Sexual activity: Not Currently    Partners: Female, Female  Other Topics Concern   Not on file  Social History Narrative   Not on file   Social Determinants of Health   Financial Resource Strain: Low Risk  (10/19/2022)   Overall Financial Resource Strain (CARDIA)    Difficulty of Paying Living Expenses: Not hard at all  Food Insecurity: No Food Insecurity (02/15/2023)   Hunger Vital Sign    Worried About Running Out of Food in the Last Year: Never true    Ran Out of Food in the Last Year: Never true  Transportation Needs: No Transportation Needs (02/15/2023)   PRAPARE - Administrator, Civil Service (Medical): No    Lack of Transportation (Non-Medical): No  Physical Activity: Inactive (10/19/2022)   Exercise Vital Sign    Days of Exercise per Week: 0 days    Minutes of Exercise per Session: 0 min  Stress: Stress Concern Present (10/19/2022)   [employer] of Occupational Health - Occupational Stress Questionnaire    Feeling of Stress : Very much  Social Connections: Socially Isolated (10/19/2022)   Social Connection and Isolation Panel [NHANES]    Frequency of Communication with Friends and Family: More than three times a week    Frequency of Social Gatherings with Friends and Family: Never    Attends Religious Services: Never    Database administrator or Organizations: No    Attends Banker Meetings: Never    Marital Status: Never married  Intimate Partner Violence: Not At Risk (02/15/2023)   Humiliation,  Afraid, Rape, and Kick questionnaire    Fear of Current or Ex-Partner: No    Emotionally Abused: No    Physically Abused: No    Sexually Abused: No    Family History  Problem Relation Age of Onset   Diabetes Mother    Diabetes Brother    Diabetes Daughter    Diabetes Daughter    Mental retardation Brother        died from PNA   Diabetes Maternal Grandmother     ROS: [x]  Positive   [ ]  Negative   [ ]  All sytems reviewed and are negative  Cardiac: []  chest pain/pressure []  hx MI []  SOB   Vascular: []  pain in legs while walking []  pain in legs at rest []  pain in legs at night [x]  non-healing ulcers []  hx of DVT []  swelling in legs [x]  pain in stump  Pulmonary: []  asthma/wheezing []  home O2  Neurologic: []  hx of CVA []  mini stroke   Hematologic: []  hx of cancer [x]  HIV  Endocrine:   [x]  diabetes []  thyroid disease  GI [x]  GERD  GU: [x]  CKD/renal failure [x]  HD--[]  M/W/F or [x]  T/T/S  Psychiatric: [x]  anxiety [x]  depression  Musculoskeletal: []  arthritis []  joint pain  Integumentary: []  rashes []  ulcers  Constitutional: []  fever  []  chills  Physical Examination  Vitals:   02/16/23 1200 02/16/23 1232  BP: 127/72 (!) 166/65  Pulse: 79 76  Resp: 12 10  Temp:    SpO2: 100% 99%   Body mass index is 28.51 kg/m.  General:  WDWN in NAD Gait: Not observed HENT: WNL, normocephalic Pulmonary: normal non-labored breathing Cardiac: regular Skin: without rashes Vascular Exam/Pulses: Right BKA stump with wound.  Her right knee appears to be contracted   Extremities: left AKA without any wounds.  Musculoskeletal: no muscle wasting or atrophy    CBC    Component Value Date/Time   WBC 5.3 02/16/2023 1021   RBC 3.08 (L) 02/16/2023 1021   HGB 8.3 (L) 02/16/2023 1021   HGB 9.2 (L) 10/19/2022 1204   HCT 27.5 (L) 02/16/2023 1021   HCT 28.6 (L) 10/19/2022 1204   PLT 297 02/16/2023 1021   PLT 343 10/19/2022 1204   MCV 89.3 02/16/2023 1021    MCV 91 10/19/2022 1204   MCH 26.9 02/16/2023 1021   MCHC 30.2 02/16/2023 1021   RDW 16.5 (H) 02/16/2023 1021   RDW 16.0 (H) 10/19/2022 1204   LYMPHSABS 1.2 02/15/2023 1715   LYMPHSABS 1.6 12/23/2017 1558   MONOABS 0.5 02/15/2023 1715   EOSABS 0.8 (H) 02/15/2023 1715   EOSABS 0.2 12/23/2017 1558   BASOSABS 0.1 02/15/2023 1715   BASOSABS 0.1 12/23/2017 1558    BMET    Component Value Date/Time   NA 136 02/16/2023 1020   NA 138 12/23/2017 1558   K 4.4 02/16/2023 1020   CL 101 02/16/2023 1020   CO2 19 (L) 02/16/2023 1020   GLUCOSE 183 (H) 02/16/2023 1020   BUN 39 (H) 02/16/2023 1020   BUN 28 (H) 12/23/2017 1558   CREATININE 10.09 (H) 02/16/2023 1020   CREATININE 12.07 (H) 05/29/2020 1155   CALCIUM 8.2 (L) 02/16/2023 1020   CALCIUM 8.5 (L) 11/25/2018 0309   GFRNONAA 4 (L) 02/16/2023 1020   GFRNONAA 79 11/14/2014 1154   GFRAA 5 (L) 01/06/2020 1329   GFRAA >89 11/14/2014 1154    COAGS: Lab Results  Component Value Date   INR 1.1 12/23/2022   INR 1.2 11/06/2022   INR 1.3 (H) 10/12/2022     Non-Invasive Vascular Imaging:   MRI right tibia/fibula 02/16/2023: IMPRESSION: 1. Postsurgical changes from below-knee amputation of the right lower extremity. 2. Acute osteomyelitis involving the distal aspect of the residual fibula. 3. Subtle marrow edema along the distal resection margin of the tibia which may reflect reactive osteitis or early acute osteomyelitis. 4. Soft tissue wound or ulceration at the distal stump laterally. Adjacent soft tissue edema. No organized fluid collections. 5. Edema within the residual calf musculature may reflect denervation changes or myositis.    ASSESSMENT/PLAN: This is a 47 y.o. female hx of right BKA in 2022 with subsequent revisions x 2 also in 2022 now with acute osteomyelitis with wound   -unable to discuss with pt currently and Dr. Randie Heinz will see pt again later today or tomorrow.   -she now has osteo in the residual fibula as  well as a wound.  Given she has had 2 revisions since her initial BKA on the right, she most likely will require a right AKA.  Her right knee appears to be contracted and would not straighten her knee-will assess this further when she is more alert and willing to follow commands.     Doreatha Massed, PA-C [facility] [phone]    I have interviewed and patient with PA and agree with assessment and plan above.  Osteomyelitis and residual limb fibula and notified patient that she would require above-knee amputation which she appears to be refusing although her mental status is waxing and waning at this  time.  Options to below-knee amputation at this time are exhausted and will discuss this with patient again when she is more alert.  Giulio Bertino C. Randie Heinz, MD [facility] [place] Office: [phone] Pager: [phone]

## 2023-02-16 NOTE — Plan of Care (Signed)
  Problem: Education: Goal: Knowledge of General Education information will improve Description: Including pain rating scale, medication(s)/side effects and non-pharmacologic comfort measures Outcome: Progressing   Problem: Clinical Measurements: Goal: Ability to maintain clinical measurements within normal limits will improve Outcome: Progressing Goal: Respiratory complications will improve Outcome: Progressing   

## 2023-02-16 NOTE — Consult Note (Signed)
Renal Service Consult Note [facility]  Kathy Frank 02/16/2023 Kathy Krabbe, MD Requesting Physician: Dr. Criselda Frank  Reason for Consult: ESRD pt w/ stump infection HPI: The patient is a 47 y.o. year-old w/ PMH as below who presented to ED yesterday for pain and drainage from her R BKA stump. No fever/ chills. Also diarrhea for a few months. She was taking abx for a while to treat an infected AVF back in may/ June. Pt admitted and started on IV vanc/ cefepime. We are asked to see for dialysis.    Pt seen in HD unit. The L arm AVF was resived and converted to AVG in May (in [place]). Then it became infected while here and she was admitted in late May. Dr Karin Lieu did L arm I&D of the surgical wound. There was no purulence and the AVG was not involved in wound beds per the OR note.   Pt is in good spirits today. No recent HD issues. The last HD on Sat, they began to stick the AVG w/ one needles, with the other line connected to the [facility].  They did not have any issues.   ROS - denies CP, no joint pain, no HA, no blurry vision, no rash, no diarrhea, no nausea/ vomiting, no dysuria, no difficulty voiding   Past Medical History  Past Medical History:  Diagnosis Date   Analgesic overuse headache 06/28/2021   Anemia, posthemorrhagic, acute 10/14/2022   Anxiety    CAD (coronary artery disease)    CAP (community acquired pneumonia) 09/29/2022   CVA (cerebral vascular accident) [facility])    Depression 06/28/2006   Qualifier: Diagnosis of  By: Laveda Abbe MD, Todd     Diabetes mellitus type 2 in obese 06/28/1994   Dyspnea    uses oxygeb 2 liters per minute at dialysis   End stage renal disease on dialysis [facility])    on hemodialysis T/Th/Sat   Erosive esophagitis    Esophageal reflux    Eye redness    Fecal incontinence 02/04/2018   Gastroparesis    ? diabetic   GERD (gastroesophageal reflux disease)    Human immunodeficiency virus (HIV) disease (HCC) 04/23/2016   Hyperkalemia  10/12/2022   Hyperlipidemia    Hypertension    Hypertensive emergency 11/09/2021   Hypertensive urgency 05/20/2021   Metabolic bone disease 05/02/2019   Moderate nonproliferative diabetic retinopathy of both eyes (HCC) 11/21/2014   11/14/14: Noted on retinal imaging; needs follow-up imaging in 6 months  05/22/16: Noted on retinal imaging again; needs follow-up imaging in 6 months   PAD (peripheral artery disease) (HCC)    Type 2 diabetes mellitus with diabetic peripheral angiopathy without gangrene (HCC) 05/01/2019   Wound infection s/p L transmetatarsal amputation    Past Surgical History  Past Surgical History:  Procedure Laterality Date   ABDOMINAL AORTOGRAM W/LOWER EXTREMITY N/A 12/13/2020   Procedure: ABDOMINAL AORTOGRAM W/LOWER EXTREMITY;  Surgeon: Chuck Hint, MD;  Location: [facility] INVASIVE CV LAB;  Service: Cardiovascular;  Laterality: N/A;   AMPUTATION Left 07/08/2018   Procedure: AMPUTATION FORTH RAY LEFT FOOT;  Surgeon: Nadara Mustard, MD;  Location: [facility] OR;  Service: Orthopedics;  Laterality: Left;   AMPUTATION Left 08/09/2018   Procedure: Left Transmetatarsal Amputation;  Surgeon: Nadara Mustard, MD;  Location: [facility] OR;  Service: Orthopedics;  Laterality: Left;   AMPUTATION Left 10/08/2018   Procedure: LEFT BELOW KNEE AMPUTATION;  Surgeon: Nadara Mustard, MD;  Location: [facility] OR;  Service: Orthopedics;  Laterality: Left;  AMPUTATION Left 10/28/2018   Procedure: REVISION BELOW KNEE AMPUTATION;  Surgeon: Nadara Mustard, MD;  Location: [facility] OR;  Service: Orthopedics;  Laterality: Left;   AMPUTATION Left 12/16/2018   Procedure: LEFT ABOVE KNEE AMPUTATION;  Surgeon: Nadara Mustard, MD;  Location:  [facility] OR;  Service: Orthopedics;  Laterality: Left;   AMPUTATION Right 01/31/2021   Procedure: AMPUTATION BELOW KNEE;  Surgeon: Nada Libman, MD;  Location: [facility] OR;  Service: Vascular;  Laterality: Right;   AMPUTATION Right 03/28/2021   Procedure: REVISION OF RIGHT BELOW KNEE AMPUTATION;   Surgeon: Nada Libman, MD;  Location: [facility] OR;  Service: Vascular;  Laterality: Right;   AMPUTATION Right 04/18/2021   Procedure: right below knee amputation/washout placement wound vac;  Surgeon: Leonie Douglas, MD;  Location: [facility] OR;  Service: Vascular;  Laterality: Right;   APPLICATION OF WOUND VAC Left 12/31/2022   Procedure: APPLICATION OF PREVENA WOUND VAC;  Surgeon: Victorino Sparrow, MD;  Location: [facility] OR;  Service: Vascular;  Laterality: Left;   AV FISTULA PLACEMENT Left 10/14/2018   Procedure: Arteriovenous (Av) Fistula Creation Left Arm;  Surgeon: Cephus Shelling, MD;  Location: [facility] OR;  Service: Vascular;  Laterality: Left;   BASCILIC VEIN TRANSPOSITION Left 04/14/2019   Procedure: BASILIC VEIN TRANSPOSITION SECOND STAGE LEFT ARM;  Surgeon: Larina Earthly, MD;  Location: [facility] OR;  Service: Vascular;  Laterality: Left;   BIOPSY  10/14/2022   Procedure: BIOPSY;  Surgeon: Benancio Deeds, MD;  Location: [facility] ENDOSCOPY;  Service: Gastroenterology;;   COMPLEX WOUND CLOSURE Left 12/31/2022   Procedure: COMPLEX WOUND CLOSURE;  Surgeon: Victorino Sparrow, MD;  Location: [facility] OR;  Service: Vascular;  Laterality: Left;   CORONARY STENT INTERVENTION N/A 05/21/2021   Procedure: CORONARY STENT INTERVENTION;  Surgeon: Marykay Lex, MD;  Location: [facility] INVASIVE CV LAB;  Service: Cardiovascular;  Laterality: N/A;   ESOPHAGOGASTRODUODENOSCOPY (EGD) WITH PROPOFOL N/A 10/14/2022   Procedure: ESOPHAGOGASTRODUODENOSCOPY (EGD) WITH PROPOFOL;  Surgeon: Benancio Deeds, MD;  Location: [facility] ENDOSCOPY;  Service: Gastroenterology;  Laterality: N/A;   HEMOSTASIS CLIP PLACEMENT  10/14/2022   Procedure: HEMOSTASIS CLIP PLACEMENT;  Surgeon: Benancio Deeds, MD;  Location: [facility] ENDOSCOPY;  Service: Gastroenterology;;   INCISION AND DRAINAGE Left 12/31/2022   Procedure: INCISION AND DRAINAGE OF LEFT ARM;  Surgeon: Victorino Sparrow, MD;  Location: [facility] OR;  Service: Vascular;  Laterality: Left;   INCISION AND DRAINAGE  ABSCESS N/A 10/24/2020   Procedure: exicision of hydradenitis;  Surgeon: Romie Levee, MD;  Location: [facility];  Service: General;  Laterality: N/A;  45 min   INSERTION OF DIALYSIS CATHETER Right 04/14/2019   Procedure: INSERTION OF DIALYSIS CATHETER;  Surgeon: Larina Earthly, MD;  Location: [facility] OR;  Service: Vascular;  Laterality: Right;   IR FLUORO GUIDE CV LINE RIGHT  10/13/2022   IR REMOVAL TUN CV CATH W/O FL  10/16/2022   IR US GUIDE VASC ACCESS RIGHT  10/13/2022   LEFT HEART CATH AND CORONARY ANGIOGRAPHY N/A 05/21/2021   Procedure: LEFT HEART CATH AND CORONARY ANGIOGRAPHY;  Surgeon: Marykay Lex, MD;  Location: [facility] INVASIVE CV LAB;  Service: Cardiovascular;  Laterality: N/A;   LOWER EXTREMITY ANGIOGRAPHY N/A 07/05/2018   Procedure: LOWER EXTREMITY ANGIOGRAPHY;  Surgeon: Nada Libman, MD;  [facility]: [facility] INVASIVE CV LAB;  Service: Cardiovascular;  Laterality: N/A;   PERIPHERAL VASCULAR ATHERECTOMY Right 12/13/2020   Procedure: PERIPHERAL VASCULAR ATHERECTOMY;  Surgeon: Chuck Hint, MD;  Location: [facility] INVASIVE CV  LAB;  Service: Cardiovascular;  Laterality: Right;  Superficial femoral   PERIPHERAL VASCULAR BALLOON ANGIOPLASTY Left 07/05/2018   Procedure: PERIPHERAL VASCULAR BALLOON ANGIOPLASTY;  Surgeon: Nada Libman, MD;  Location: [facility] INVASIVE CV LAB;  Service: Cardiovascular;  Laterality: Left;  SFA   PERIPHERAL VASCULAR BALLOON ANGIOPLASTY Right 12/13/2020   Procedure: PERIPHERAL VASCULAR BALLOON ANGIOPLASTY;  Surgeon: Chuck Hint, MD;  Location: [facility] INVASIVE CV LAB;  Service: Cardiovascular;  Laterality: Right;  Peroneal artery, anterior tibial artery   RECTAL EXAM UNDER ANESTHESIA N/A 10/24/2020   Procedure: EXAM UNDER ANESTHESIA;  Surgeon: Romie Levee, MD;  Location: [facility];  Service: General;  Laterality: N/A;   STUMP REVISION Left 10/19/2018   Procedure: REVISION LEFT BELOW KNEE AMPUTATION;  Surgeon: Nadara Mustard, MD;  Location:  [facility] OR;  Service: Orthopedics;  Laterality: Left;   TUBAL LIGATION  2002   Family History  Family History  Problem Relation Age of Onset   Diabetes Mother    Diabetes Brother    Diabetes Daughter    Diabetes Daughter    Mental retardation Brother        died from PNA   Diabetes Maternal Grandmother    Social History  reports that she quit smoking about 8 years ago. Her smoking use included cigarettes. She started smoking about 9 years ago. She has a 1.00 pack-year smoking history. She has never used smokeless tobacco. She reports that she does not currently use alcohol. She reports current drug use. Drug: Marijuana. Allergies No Known Allergies Home medications Prior to Admission medications   Medication Sig Start Date End Date Taking? Authorizing Provider  Accu-Chek Softclix Lancets lancets Use as instructed 05/02/21   Atway, Rayann N, DO  acetaminophen (TYLENOL) 500 MG tablet Take 2 tablets (1,000 mg total) by mouth every 6 (six) hours. 12/25/22   Marrianne Mood, MD  albuterol (VENTOLIN HFA) 108 (90 Base) MCG/ACT inhaler Inhale 2 puffs into the lungs every 4 (four) hours as needed for wheezing or shortness of breath. 02/14/21   Angiulli, Mcarthur Rossetti, PA-C  amLODipine (NORVASC) 10 MG tablet Take 1 tablet (10 mg total) by mouth daily. 10/17/22   Rocky Morel, DO  amLODipine-olmesartan (AZOR) 10-40 MG tablet Take 1 tablet by mouth every evening. 02/09/23   [provider]  atorvastatin (LIPITOR) 80 MG tablet Take 1 tablet (80 mg total) by mouth daily. 02/01/23   Willette Cluster, MD  bictegravir-emtricitabine-tenofovir AF (BIKTARVY) 50-200-25 MG TABS tablet Take 1 tablet by mouth daily. OVERDUE FOR OFFICE FOLLOW UP - PLEASE CALL OUR OFFICE TO SCHEDULE [phone] Patient taking differently: Take 1 tablet by mouth daily. 08/26/22   Cliffton Asters, MD  bisacodyl (DULCOLAX) 10 MG suppository Place 1 suppository (10 mg total) rectally as needed for moderate constipation. 12/25/22   Marrianne Mood, MD  calcitRIOL (ROCALTROL) 0.25 MCG capsule Take 5 capsules (1.25 mcg total) by mouth Every Tuesday,Thursday,and Saturday with dialysis. 07/02/22   Marolyn Haller, MD  calcium acetate (PHOSLO) 667 MG capsule Take 2 capsules (1,334 mg total) by mouth 3 (three) times daily with meals. 06/30/22   Marolyn Haller, MD  Continuous Blood Gluc Receiver (DEXCOM G7 RECEIVER) DEVI Use to check blood sugars cntinuously Patient not taking: Reported on 02/03/2023 06/30/22   Marolyn Haller, MD  Continuous Blood Gluc Sensor (DEXCOM G7 SENSOR) MISC Use to check blood sugar continuously Patient not taking: Reported on 02/03/2023 06/30/22   Marolyn Haller, MD  cyclobenzaprine (FLEXERIL) 5 MG tablet Take 5 mg by mouth  2 (two) times daily as needed for muscle spasms.    [provider]  Darbepoetin Alfa (ARANESP) 200 MCG/0.4ML SOSY injection Inject 0.4 mLs (200 mcg total) into the skin every Monday at 6 PM. Patient taking differently: Inject 200 mcg into the skin every 7 (seven) days. Every Monday 07/06/22   Marolyn Haller, MD  diclofenac Sodium (VOLTAREN) 1 % GEL Apply 4 g topically 4 (four) times daily. Patient taking differently: Apply 4 g topically daily as needed (for pain). 10/02/22   Adron Bene, MD  EQ ASPIRIN ADULT LOW DOSE 81 MG tablet TAKE 1 TABLET BY MOUTH ONCE DAILY. SWALLOW WHOLE. Patient taking differently: Take 81 mg by mouth daily. 02/06/22   Maeola Harman, MD  escitalopram (LEXAPRO) 5 MG tablet Take 1 tablet (5 mg total) by mouth daily. 02/01/23   Willette Cluster, MD  gabapentin (NEURONTIN) 100 MG capsule Take 100 mg by mouth at bedtime. 09/10/22   [provider]  glucose blood test strip Use as instructed 05/02/21   Atway, Rayann N, DO  hydrocortisone-pramoxine (PROCTOFOAM-HC) rectal foam Place 1 applicator rectally 2 (two) times daily. 11/07/22   Dione Booze, MD  insulin glargine (LANTUS SOLOSTAR) 100 UNIT/ML Solostar Pen Inject 15 Units into the  skin at bedtime. 02/01/23   Willette Cluster, MD  Insulin Pen Needle (PENTIPS) 32G X 4 MM MISC Use as directed 11/10/22   Quincy Simmonds, MD  lidocaine (LIDODERM) 5 % Place 1 patch onto the skin daily. Remove & Discard patch within 12 hours or as directed by MD Patient taking differently: Place 1 patch onto the skin daily as needed (For pain). 10/03/22   Adron Bene, MD  liraglutide (VICTOZA) 18 MG/3ML SOPN Inject 0.6 mg into the skin daily. 11/03/22 10/29/23  Adron Bene, MD  LOKELMA 5 g packet Take 5 g by mouth daily. 07/30/22   [provider]  multivitamin (RENA-VIT) TABS tablet Take 1 tablet by mouth daily. Patient taking differently: Take 1 tablet by mouth See admin instructions. On Tuesday, Wednesday and Thursday 02/14/21   Angiulli, Mcarthur Rossetti, PA-C  ondansetron (ZOFRAN) 8 MG tablet Take 1 tablet (8 mg total) by mouth every 8 (eight) hours as needed for nausea or vomiting. 12/25/22   Marrianne Mood, MD  oxyCODONE-acetaminophen (PERCOCET) 5-325 MG tablet Take 1 tablet by mouth every 4 (four) hours as needed for severe pain. 12/31/22 12/31/23  Emilie Rutter, PA-C  pantoprazole (PROTONIX) 40 MG tablet Take 1 tablet (40 mg total) by mouth 2 (two) times daily. 12/25/22   Marrianne Mood, MD  polyethylene glycol (MIRALAX / GLYCOLAX) 17 g packet Take 17 g by mouth 2 (two) times daily. 12/25/22   Marrianne Mood, MD  senna-docusate (SENOKOT-S) 8.6-50 MG tablet Take 1 tablet by mouth at bedtime. 12/25/22   Marrianne Mood, MD  Ubrogepant (UBRELVY) 50 MG TABS Take 1 tablet (50 mg total) by mouth as needed (migraine headache). if needed a second dose may be taken at least 2 hours after the initial dose; MAX 8 doses in a month 01/25/23   Quincy Simmonds, MD     Vitals:   02/16/23 1007 02/16/23 1030 02/16/23 1100 02/16/23 1130  BP: (!) 164/76 (!) 144/100 134/74 (!) 142/75  Pulse: 76 77 74 73  Resp: 15 14 12 10   Temp:      TempSrc:      SpO2: 100% 100% 100% 100%  Weight:        Exam Gen alert, no distress No rash, cyanosis or gangrene Sclera anicteric,  throat clear  No jvd or bruits Chest clear bilat to bases, no rales/ wheezing RRR no MRG Abd soft ntnd no mass or ascites +bs GU defer MS L AKA, R BKA wrapped Ext no LE or UE edema, no wounds or ulcers Neuro is alert, Ox 3 , nf    RIJ TDC/ LUA AVG+bruit      Home meds include - albuterol, norvasc 10, olmesartan 40 every day, lipitor, biktarvy, rocaltrol 1.25 three times per week, phoslo 2 ac tid, asa, lexaprol, gabapentin 100 every day, insulin glargine, liraglutide, loklema 5 gm every day, renavite, percocet prn, protonix, prns/ vits/ supps      OP HD: TTS [facility]  4h  400/1.5   71kg   2K/3Ca bath  RIJ TDC / revised AVF LUA Heparin none - last OP HD 7/06, post wt 71.7kg - started 7/06 w/ one needle in AVF and other line in the catheter - mircera 150 mcg IV q 2 wks, last was on 01/14/23 - rocaltrol 1.0 mcg po tiw   Assessment/ Plan: 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 Getting IV abx vanc/ rocephin. Not appearing septic. Per pmd.  Chronic diarrhea - for 3 mos. No abd pain. W/u per pmd.  ESRD - on HD TTS. Last OP HD Sat. HD today in progress.  HTN - cont home bp meds per pmd.  Volume - up 6kg by wts, no sig edema, no resp issues. Max UF today.  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 Will order 150mg  darbe weekly on Tuesday while here.  MBD ckd - CCa and phos in range. Cont po vdra and phoslo as binder.  HIV - on biktarvy PAD - w/ L AKA and R BKA DM2 - per pmd      Kathy Moselle  MD [facility] 02/16/2023, 11:49 AM  Recent Labs  Lab 02/15/23 1715 02/16/23 1020 02/16/23 1021  HGB 8.5*  --  8.3*  ALBUMIN  --  2.6*  --   CALCIUM 8.8* 8.2*  --   PHOS  --  6.9*  --   CREATININE 9.55* 10.09*  --   K 4.6 4.4  --    Inpatient medications:  amLODipine  10 mg Oral Daily   bictegravir-emtricitabine-tenofovir AF  1 tablet Oral q1800   Chlorhexidine Gluconate Cloth  6  each Topical Q0600   feeding supplement (NEPRO CARB STEADY)  237 mL Oral BID BM   heparin  5,000 Units Subcutaneous Q8H   heparin sodium (porcine)       insulin aspart  0-5 Units Subcutaneous QHS   insulin aspart  0-9 Units Subcutaneous TID WC   insulin glargine-yfgn  7 Units Subcutaneous Daily   irbesartan  300 mg Oral Daily   pantoprazole  40 mg Oral BID    anticoagulant sodium citrate     ceFEPime (MAXIPIME) IV Stopped (02/15/23 2335)   vancomycin     alteplase, anticoagulant sodium citrate, heparin, heparin sodium (porcine), HYDROmorphone, lidocaine (PF), lidocaine-prilocaine, ondansetron **OR** ondansetron (ZOFRAN) IV, pentafluoroprop-tetrafluoroeth, sodium chloride flush

## 2023-02-16 NOTE — Procedures (Signed)
I was present at this dialysis session, have reviewed the session and made  appropriate changes Vinson Moselle MD  [facility] 02/16/2023, 12:11 PM

## 2023-02-16 NOTE — Assessment & Plan Note (Signed)
When in [place] in 4/24 she had AVF complication requiring graft placement. This was complicated by infection with revision at [facility] by Dr. Juanetta Gosling in 5/24. Graft was cleared for HD use first week of July. She reports that it was used last week successfully. TDC remains in place from 12/02/22. If graft is working would think TDC could be removed soon.

## 2023-02-16 NOTE — Progress Notes (Signed)
Pt receives out-pt HD at [facility] on TTS. Will assist as needed.   Loraina Stauffer Renal Navigator [phone] 

## 2023-02-16 NOTE — Progress Notes (Signed)
Subjective:   Pt is a 47 year old female with PMHx of ESRD on HD, Type II DM c/b gastroparesis, well controlled HIV, hx of gastric ulcers, hx of PVD s/p right BKA who presented to the clinic on 02/15/23 for her stump wound and was directly admitted for further management.  MRI this morning showed acute osteomyelitis involving the distal aspect of the residual fibula and soft tissue wound or ulceration at the distal stump laterally along with adjacent soft tissue edema.   Overnight events: Requested pain medication last night  Today, she reports 8/10 pain in her right BKA stump. She denies CP,SOB,fever,chills, nausea, vomiting, and diarrhea. Patient denied any bowel movements. During mediation review, she is unaware which medications she is taking at home but denies using miralax (denies .  She is currently having a migraine.   Objective:  Vital signs in last 24 hours: Vitals:   02/16/23 1100 02/16/23 1130 02/16/23 1200 02/16/23 1232  BP: 134/74 (!) 142/75 127/72 (!) 166/65  Pulse: 74 73 79 76  Resp: 12 10 12 10   Temp:      TempSrc:      SpO2: 100% 100% 100% 99%  Weight:       PE: Cardiac: RRR, no murmur Pulmonary: normal work of breathing observed Abdominal: Normal bowel sounds, non tender MSK: B/L lower extremity amputation, R lower extremity is wrapped in clean dry dressing Skin: Warm and dry    Latest Ref Rng & Units 02/16/2023   10:21 AM 02/15/2023    5:15 PM 12/31/2022   11:41 AM  CBC  WBC 4.0 - 10.5 K/uL 5.3  5.5    Hemoglobin 12.0 - 15.0 g/dL 8.3  8.5  16.1   Hematocrit 36.0 - 46.0 % 27.5  28.1  33.0   Platelets 150 - 400 K/uL 297  320         Latest Ref Rng & Units 02/16/2023   10:20 AM 02/15/2023    5:15 PM 12/31/2022   11:41 AM  CMP  Glucose 70 - 99 mg/dL 096  045  409   BUN 6 - 20 mg/dL 39  33  15   Creatinine 0.44 - 1.00 mg/dL 81.19  1.47  8.29   Sodium 135 - 145 mmol/L 136  137  134   Potassium 3.5 - 5.1 mmol/L 4.4  4.6  5.0   Chloride 98 - 111 mmol/L 101  99   104   CO2 22 - 32 mmol/L 19  22    Calcium 8.9 - 10.3 mg/dL 8.2  8.8       Assessment/Plan:  Principal Problem:   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)  #Right lower extremity stump infection MRI of the right lower extremity stump showed acute osteomyelitis of the residual portion of the fibula, tissue wound and soft tissue edema. -Vascular surgery consulted: Dr. Randie Heinz is recommended above-knee amputation however it seems to be that the patient is refusing the amputation.  Will follow-up on patient's decision, and vascular surgery's follow-up discussion with her -Continue vancomycin and cefepime antibiotics -Will follow-up on blood cultures and wound cultures, may adjust therapy once those have resulted  -Blood cultures are no growth at less than 12 hours  -Wound culture has moderate Pseudomonas aeruginosa, and few WBC present.  -Follow-up with susceptibilities  #End-stage renal disease -Patient has a AV fistula that was approved to be used on July 5, consulted with nephrology to determine if AV fistula could be used in place  of tunnel dialysis catheter -She received dialysis this morning  #Chronic diarrhea Reported diarrhea for 3 months, unsure whether or not she is taking the stool softeners and laxatives that were prescribed to her during her last hospitalization.  She has not had a bowel movement since she has been here which suggests that it could have been from the stool softener since she is unaware which medication she does and does not take. Although unlikely, will also explore the possibility of pancreatic exocrine insufficiency and infection.  -Follow-up on fecal elastase, C. difficile study, stool osmolality -Follow-up on HIV viral load, T-helper cell CD4 count  #HIV -Will follow-up on HIV viral load, helper T-cell CD4 count -Biktarvy restarted   #HTN: On amlodipine-Olmesartan combo pill   #DMII: Will start 7 units of long acting insulin and place  on SSI  Felesia Stahlecker, DO 02/16/2023, 1:17 PM Pager: [phone] After 5pm on weekdays and 1pm on weekends: On Call pager [phone]

## 2023-02-16 NOTE — Consult Note (Addendum)
WOC Nurse Consult Note: patient with R BKA 01/2021, revision 03/2021  Reason for Consult: R stump wound  Wound type: full thickness; patient states she developed a blister in this area that "popped" on Friday 02/12/2023  Pressure Injury POA: NA  Measurement: 2 cm x 3 cm x 0.5 cm  Wound bed: 75% red moist 25% yellow  Drainage (amount, consistency, odor) moderate tan exudate present on dressing  Periwound: intact  Dressing procedure/placement/frequency: Clean R stump wound with Vashe [place] [phone]), apply Vashe moistened (not saturated) gauze  daily to wound bed, top with dry gauze and wrap with Kerlix.    POC discussed with patient and bedside nurse.  WOC team will not follow at this time. Re-consult if further needs arise.   Thank you,     Priscella Mann MSN, RN-BC, [employer] [phone]

## 2023-02-16 NOTE — Addendum Note (Signed)
Addended by: Lucille Passy on: 02/16/2023 06:01 AM   Modules accepted: Level of Service

## 2023-02-16 NOTE — Plan of Care (Signed)

## 2023-02-17 DIAGNOSIS — G43909 Migraine, unspecified, not intractable, without status migrainosus: Secondary | ICD-10-CM

## 2023-02-17 DIAGNOSIS — K529 Noninfective gastroenteritis and colitis, unspecified: Secondary | ICD-10-CM | POA: Diagnosis not present

## 2023-02-17 DIAGNOSIS — M86161 Other acute osteomyelitis, right tibia and fibula: Secondary | ICD-10-CM

## 2023-02-17 DIAGNOSIS — E1169 Type 2 diabetes mellitus with other specified complication: Secondary | ICD-10-CM

## 2023-02-17 DIAGNOSIS — T8743 Infection of amputation stump, right lower extremity: Secondary | ICD-10-CM | POA: Diagnosis not present

## 2023-02-17 LAB — GASTROINTESTINAL PANEL BY PCR, STOOL (REPLACES STOOL CULTURE)

## 2023-02-17 LAB — CBC
HCT: 31.1 % — ABNORMAL LOW (ref 36.0–46.0)
Hemoglobin: 9.5 g/dL — ABNORMAL LOW (ref 12.0–15.0)
MCH: 26.9 pg (ref 26.0–34.0)
MCHC: 30.5 g/dL (ref 30.0–36.0)
MCV: 88.1 fL (ref 80.0–100.0)
Platelets: 314 10*3/uL (ref 150–400)
RBC: 3.53 MIL/uL — ABNORMAL LOW (ref 3.87–5.11)
RDW: 16.6 % — ABNORMAL HIGH (ref 11.5–15.5)
WBC: 9.4 10*3/uL (ref 4.0–10.5)
nRBC: 0 % (ref 0.0–0.2)

## 2023-02-17 LAB — RENAL FUNCTION PANEL
Albumin: 2.7 g/dL — ABNORMAL LOW (ref 3.5–5.0)
Anion gap: 13 (ref 5–15)
BUN: 20 mg/dL (ref 6–20)
CO2: 21 mmol/L — ABNORMAL LOW (ref 22–32)
Calcium: 8.5 mg/dL — ABNORMAL LOW (ref 8.9–10.3)
Chloride: 98 mmol/L (ref 98–111)
Creatinine, Ser: 5.54 mg/dL — ABNORMAL HIGH (ref 0.44–1.00)
GFR, Estimated: 9 mL/min — ABNORMAL LOW (ref >60)
Glucose, Bld: 163 mg/dL — ABNORMAL HIGH (ref 70–99)
Phosphorus: 4.2 mg/dL (ref 2.5–4.6)
Potassium: 4 mmol/L (ref 3.5–5.1)
Sodium: 132 mmol/L — ABNORMAL LOW (ref 135–145)

## 2023-02-17 LAB — GLUCOSE, CAPILLARY
Glucose-Capillary: 137 mg/dL — ABNORMAL HIGH (ref 70–99)
Glucose-Capillary: 131 mg/dL — ABNORMAL HIGH (ref 70–99)
Glucose-Capillary: 98 mg/dL (ref 70–99)
Glucose-Capillary: 100 mg/dL — ABNORMAL HIGH (ref 70–99)

## 2023-02-17 LAB — HIV-1 RNA QUANT-NO REFLEX-BLD
HIV 1 RNA Quant: <20 copies/mL
LOG10 HIV-1 RNA: UNDETERMINED log10copy/mL

## 2023-02-17 MED ORDER — LOPERAMIDE HCL 2 MG PO CAPS: 2.0000 mg | ORAL_CAPSULE | ORAL | Status: DC | PRN

## 2023-02-17 MED ORDER — CHLORHEXIDINE GLUCONATE CLOTH 2 % EX PADS
6.0000 | MEDICATED_PAD | Freq: Every day | CUTANEOUS | Status: DC
  Administered 2023-02-18 – 2023-02-19 (×2): 6 via TOPICAL

## 2023-02-17 MED ORDER — PSYLLIUM 95 % PO PACK
1.0000 | PACK | Freq: Every day | ORAL | Status: DC
  Administered 2023-02-18 – 2023-02-22 (×3): 1 via ORAL
  Filled 2023-02-17 (×6): qty 1

## 2023-02-17 MED ORDER — ASPIRIN-ACETAMINOPHEN-CAFFEINE 250-250-65 MG PO TABS: 1.0000 | ORAL_TABLET | Freq: Three times a day (TID) | ORAL | Status: DC | PRN

## 2023-02-17 NOTE — Hospital Course (Addendum)
#  Acute osteomyelitis of right residual fibula She presented to the clinic on 02/15/23 for her stump wound and was directly admitted for further management.  MRI showed acute osteomyelitis involving the distal aspect of the residual fibula and soft tissue wound or ulceration at the distal stump laterally along with adjacent soft tissue edema.  The wound culture back positive for Pseudomonas aeruginosa.  She received IV vancomycin and IV cefepime and underwent a right BKA to AKA conversion on 7/12 and completed antibiotic therapy on 7/14.  #Chronic Diarrhea Patient reported 3 months of chronic watery diarrhea. GI panel and C. Difficile panel were negative. Her fecal elastase and stool osmolarity are still pending. Diarrhea could likely be due to inadvertently misusing laxatives due to her lack of insight on which medications she is currently using.  She was sent home on numerous stool softeners after her last hospitalization and is unsure if they are being consumed.  Please ensure that she is not using laxatives/stool softeners.  #Changes in mentation, possible depression--> resolved Patient was noted to have changes in baseline mentation on 7/13 when the team evaluated her. She was not participating in conversation and continued rocking back and forth with her body. She was not at baseline. On 7/14, the changes in mentation seemed to be resolved. Changes in mentation could have been due to anesthesia and pain medications in ESRD or depression due to recent AKA.

## 2023-02-17 NOTE — Progress Notes (Cosign Needed Addendum)
[facility] Progress Note   Subjective: Seen in room. She has agreed to proceed with R AKA Friday but is very sad. Emotional support to patient. Still have diarrhea.     Objective Vitals:   02/16/23 2112 02/17/23 0346 02/17/23 0500 02/17/23 0900  BP: 131/60 (!) 145/66  114/60  Pulse: 87 91  87  Resp:  16  16  Temp: 97.7 F (36.5 C) 98.1 F (36.7 C)  98.2 F (36.8 C)  TempSrc: Oral Oral  Oral  SpO2: 100% 100%  100%  Weight:   73.5 kg    Physical Exam General: Chronically ill appearing female in NAD Heart: RRR NO M/R/G Lungs: CTAB No WOB Abdomen: NABS, NT Extremities: R BKA wrapped/L AKA. No stump edema Dialysis Access: RIJ TDC drsg intact. L AVG + T/B    Additional Objective Labs: Basic Metabolic Panel: Recent Labs  Lab 02/15/23 1715 02/16/23 1020 02/17/23 0307  NA 137 136 132*  K 4.6 4.4 4.0  CL 99 101 98  CO2 22 19* 21*  GLUCOSE 221* 183* 163*  BUN 33* 39* 20  CREATININE 9.55* 10.09* 5.54*  CALCIUM 8.8* 8.2* 8.5*  PHOS  --  6.9* 4.2   Liver Function Tests: Recent Labs  Lab 02/16/23 1020 02/17/23 0307  ALBUMIN 2.6* 2.7*   No results for input(s): "LIPASE", "AMYLASE" in the last 168 hours. CBC: Recent Labs  Lab 02/15/23 1715 02/16/23 1021 02/17/23 0307  WBC 5.5 5.3 9.4  NEUTROABS 2.9  --   --   HGB 8.5* 8.3* 9.5*  HCT 28.1* 27.5* 31.1*  MCV 87.3 89.3 88.1  PLT 320 297 314   Blood Culture    Component Value Date/Time   SDES BLOOD RIGHT HAND 02/15/2023 1814   SPECREQUEST  02/15/2023 1814    BOTTLES DRAWN AEROBIC AND ANAEROBIC Blood Culture adequate volume   CULT  02/15/2023 1814    NO GROWTH 2 DAYS Performed at [facility], [address], [name], [place] [address]    REPTSTATUS PENDING 02/15/2023 1814    Cardiac Enzymes: No results for input(s): "CKTOTAL", "CKMB", "CKMBINDEX", "TROPONINI" in the last 168 hours. CBG: Recent Labs  Lab 02/15/23 2139 02/16/23 0857 02/16/23 1540 02/16/23 2114 02/17/23 0845   GLUCAP 186* 128* 118* 172* 137*   Iron Studies: No results for input(s): "IRON", "TIBC", "TRANSFERRIN", "FERRITIN" in the last 72 hours. @lablastinr3 @ Studies/Results: MR TIBIA FIBULA RIGHT W WO CONTRAST  Result Date: 02/16/2023 CLINICAL DATA:  Soft tissue infection suspected; stump infection EXAM: MRI OF LOWER RIGHT EXTREMITY WITHOUT AND WITH CONTRAST TECHNIQUE: Multiplanar, multisequence MR imaging of the right lower extremity was performed both before and after administration of intravenous contrast. CONTRAST:  7mL GADAVIST GADOBUTROL 1 MMOL/ML IV SOLN COMPARISON:  X-ray 04/17/2021 FINDINGS: Bones/Joint/Cartilage Postsurgical changes from below-knee amputation of the right lower extremity. Bone marrow edema and enhancement involving the resection margin of the residual fibula with confluent low T1 marrow signal change distally, most compatible with acute osteomyelitis. Subtle marrow edema along the distal resection margin of the tibia with preservation of the T1 marrow signal. This may reflect reactive osteitis or early acute osteomyelitis. Bone infarcts are noted within the proximal tibia. Knee joint intact with mild patellofemoral osteoarthritis. No significant knee joint effusion. No fracture or dislocation. Ligaments Intact knee ligaments. Muscles and Tendons Amputation changes of the proximal calf musculature. Edema within the residual calf musculature may reflect denervation changes or myositis. Intact tendinous structures. Soft tissues Soft tissue wound or ulceration at the  distal stump laterally. Adjacent soft tissue edema. No organized fluid collections. IMPRESSION: 1. Postsurgical changes from below-knee amputation of the right lower extremity. 2. Acute osteomyelitis involving the distal aspect of the residual fibula. 3. Subtle marrow edema along the distal resection margin of the tibia which may reflect reactive osteitis or early acute osteomyelitis. 4. Soft tissue wound or ulceration at the  distal stump laterally. Adjacent soft tissue edema. No organized fluid collections. 5. Edema within the residual calf musculature may reflect denervation changes or myositis. Electronically Signed   By: Duanne Guess D.O.   On: 02/16/2023 10:07   Medications:  ceFEPime (MAXIPIME) IV 1 g (02/16/23 2257)   vancomycin 750 mg (02/16/23 1530)    amLODipine  10 mg Oral Daily   bictegravir-emtricitabine-tenofovir AF  1 tablet Oral Q supper   calcitRIOL  1 mcg Oral Q T,Th,Sat-1800   calcium acetate  1,334 mg Oral TID WC   Chlorhexidine Gluconate Cloth  6 each Topical Q0600   darbepoetin (ARANESP) injection - DIALYSIS  150 mcg Subcutaneous Q Tue-1800   feeding supplement (NEPRO CARB STEADY)  237 mL Oral BID BM   heparin  5,000 Units Subcutaneous Q8H    HYDROmorphone (DILAUDID) injection  1 mg Intravenous Q6H   insulin aspart  0-5 Units Subcutaneous QHS   insulin aspart  0-9 Units Subcutaneous TID WC   insulin glargine-yfgn  7 Units Subcutaneous Daily   irbesartan  300 mg Oral Daily   pantoprazole  40 mg Oral BID     OP HD: TTS [facility]  4h  400/1.5   71kg   2K/3Ca bath  RIJ TDC / revised AVF LUA Heparin none - last OP HD 7/06, post wt 71.7kg - started 7/06 w/ one needle in AVF and other line in the catheter - mircera 150 mcg IV q 2 wks, last was on 01/14/23 - rocaltrol 1.0 mcg po tiw     Assessment/ Plan: 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 Getting IV abx vanc/ rocephin. Not appearing septic. Planning for R AKA per Dr. Randie Heinz 02/19/2023.   Chronic diarrhea - for 3 mos. No abd pain. W/u per pmd. C diff negative. GI panel pending.  ESRD - on HD TTS. Next HD 02/18/2023. Use AVG with HD.  HTN - BP controlled. cont home bp meds per pmd.  Volume - up 6kg by wts, no sig edema, no resp issues. Max UF 02/16/2023 with Net UF 4 liters. Still above OP EDW. Continue to lower as tolerated.  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 Will order 150mg  darbe weekly on  Tuesday while here.  MBD ckd - CCa and phos in range. Cont po vdra and phoslo as binder.  HIV - on biktarvy PAD - w/ L AKA and R BKA DM2 - per pmd  Henning Ehle H. Tamiyah Moulin NP-C 02/17/2023, 11:47 AM  [employer] [phone]

## 2023-02-17 NOTE — Progress Notes (Addendum)
  Progress Note    02/17/2023 8:27 AM * No surgery found *  Subjective:  sitting up in bed   Vitals:   02/16/23 2112 02/17/23 0346  BP: 131/60 (!) 145/66  Pulse: 87 91  Resp:  16  Temp: 97.7 F (36.5 C) 98.1 F (36.7 C)  SpO2: 100% 100%   Physical Exam: Cardiac:  regular Lungs:  non labored Incisions:  Right BKA dressings clean, dry and intact Neurologic: alert and oriented  CBC    Component Value Date/Time   WBC 9.4 02/17/2023 0307   RBC 3.53 (L) 02/17/2023 0307   HGB 9.5 (L) 02/17/2023 0307   HGB 9.2 (L) 10/19/2022 1204   HCT 31.1 (L) 02/17/2023 0307   HCT 28.6 (L) 10/19/2022 1204   PLT 314 02/17/2023 0307   PLT 343 10/19/2022 1204   MCV 88.1 02/17/2023 0307   MCV 91 10/19/2022 1204   MCH 26.9 02/17/2023 0307   MCHC 30.5 02/17/2023 0307   RDW 16.6 (H) 02/17/2023 0307   RDW 16.0 (H) 10/19/2022 1204   LYMPHSABS 1.2 02/15/2023 1715   LYMPHSABS 1.6 12/23/2017 1558   MONOABS 0.5 02/15/2023 1715   EOSABS 0.8 (H) 02/15/2023 1715   EOSABS 0.2 12/23/2017 1558   BASOSABS 0.1 02/15/2023 1715   BASOSABS 0.1 12/23/2017 1558    BMET    Component Value Date/Time   NA 132 (L) 02/17/2023 0307   NA 138 12/23/2017 1558   K 4.0 02/17/2023 0307   CL 98 02/17/2023 0307   CO2 21 (L) 02/17/2023 0307   GLUCOSE 163 (H) 02/17/2023 0307   BUN 20 02/17/2023 0307   BUN 28 (H) 12/23/2017 1558   CREATININE 5.54 (H) 02/17/2023 0307   CREATININE 12.07 (H) 05/29/2020 1155   CALCIUM 8.5 (L) 02/17/2023 0307   CALCIUM 8.5 (L) 11/25/2018 0309   GFRNONAA 9 (L) 02/17/2023 0307   GFRNONAA 79 11/14/2014 1154   GFRAA 5 (L) 01/06/2020 1329   GFRAA >89 11/14/2014 1154    INR    Component Value Date/Time   INR 1.1 12/23/2022 1623     Intake/Output Summary (Last 24 hours) at 02/17/2023 0827 Last data filed at 02/17/2023 0600 Gross per 24 hour  Intake 411.79 ml  Output 4000 ml  Net -3588.21 ml     Assessment/Plan:  47 y.o. female hx of right BKA in 2022 with subsequent  revisions x 2 also in 2022 now with acute osteomyelitis with wound. Discussed needing AKA yesterday and at the time she was not agreeable to proceed. She is now agreeable to a right AKA. This will be scheduled for the OR on Friday 7/12    Graceann Congress, [place] [facility] [phone] 02/17/2023 8:27 AM  I have independently interviewed and examined patient and agree with PA assessment and plan above.  She is agreeable to conversion 2 amputation on the right and will schedule for this Friday in the OR.  Will make n.p.o. past midnight Thursday.  Yang Rack C. Randie Heinz, MD [facility] [place] Office: [phone] Pager: [phone]

## 2023-02-17 NOTE — TOC Initial Note (Signed)
Transition of Care [facility]) - Initial/Assessment Note    Patient Details  Name: Kathy Frank MRN: [id number] Date of Birth: 1976/07/05  Transition [facility]) CM/SW Contact:    Lawerance Sabal, RN Phone Number: 02/17/2023, 3:12 PM  Clinical Narrative:                  Patient admitted from home with children. +Osteo to R BKA, plan for sx Friday to take BKA up to AKA.  PCP Jeral Pinch, DO Patient has insurance coverage.  TOC will continue to follow   Expected Discharge Plan: Home w Home Health Services Barriers to Discharge: Continued Medical Work up   Patient Goals and CMS Choice            Expected Discharge Plan and Services In-house Referral: Clinical Social Work Discharge Planning Services: CM Consult   Living arrangements for the past 2 months: Apartment                                      Prior Living Arrangements/Services Living arrangements for the past 2 months: Apartment Lives with:: Minor Children              Current home services: DME (WC, BSC, prosthesis)    Activities of Daily Living Home Assistive Devices/Equipment: Wheelchair, Bedside commode/3-in-1, CBG Meter ADL Screening (condition at time of admission) Patient's cognitive ability adequate to safely complete daily activities?: Yes Is the patient deaf or have difficulty hearing?: No Does the patient have difficulty seeing, even when wearing glasses/contacts?: No Does the patient have difficulty concentrating, remembering, or making decisions?: No Patient able to express need for assistance with ADLs?: Yes Does the patient have difficulty dressing or bathing?: No Independently performs ADLs?: Yes (appropriate for developmental age) Does the patient have difficulty walking or climbing stairs?: Yes Weakness of Legs: None Weakness of Arms/Hands: None  Permission Sought/Granted                  Emotional Assessment              Admission diagnosis:  Infection of  amputation stump of right lower extremity (HCC) [T87.43] Patient Active Problem List   Diagnosis Date Noted   S/P arteriovenous (AV) graft placement 02/16/2023   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) 02/15/2023   Colitis 02/04/2023   A-V fistula (HCC) 12/23/2022   Dependence on wheelchair 11/13/2022   Blister 10/29/2022   High anion gap metabolic acidosis 10/13/2022   Acute gastric ulcer with hemorrhage 10/13/2022   Right-sided chest pain 10/01/2022   Homelessness 08/26/2022   Migraine with aura 06/29/2022   Requires assistance with activities of daily living (ADL) 10/27/2021   Urinary and fecal incontinence 05/30/2021   Status post coronary artery stent placement    Coronary artery disease involving native coronary artery of native heart with unstable angina pectoris (HCC)    History of non-ST elevation myocardial infarction (NSTEMI) 05/20/2021   BKA stump complication (HCC) 04/18/2021   Right below-knee amputee (HCC) 02/08/2021   Peripheral arterial disease (HCC) 01/29/2021   Anxiety 05/28/2020   Patient's other noncompliance with medication regimen 12/28/2019   Major depressive disorder, single episode, unspecified 04/24/2019   Secondary hyperparathyroidism of renal origin (HCC) 04/24/2019   S/P AKA (above knee amputation), left (HCC) 12/16/2018   Anemia due to chronic kidney disease    Moderate protein-calorie malnutrition (HCC)  Adjustment disorder with mixed anxiety and depressed mood 08/15/2018   ESRD on dialysis [facility])    Aortic atherosclerosis (HCC) 09/30/2016   Human immunodeficiency virus (HIV) disease (HCC) 04/23/2016   Gastroparesis    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 [place] [address] Phone: [phone] Fax: [phone]  [facility] [address] [place] [address] Phone: [phone] Fax: [phone]     Social Determinants of Health (SDOH) Social History: SDOH Screenings   Food Insecurity: No Food Insecurity (02/15/2023)  Housing: Low Risk  (02/15/2023)  Transportation Needs: No Transportation Needs (02/15/2023)  Utilities: Not At Risk (02/15/2023)  Alcohol Screen: Low Risk  (10/19/2022)  Depression (PHQ2-9): High Risk (11/03/2022)  Financial Resource Strain: Low Risk  (10/19/2022)  Physical Activity: Inactive (10/19/2022)  Social Connections: Socially Isolated (10/19/2022)  Stress: Stress Concern Present (10/19/2022)  Tobacco Use: Medium Risk (02/15/2023)   SDOH Interventions:     Readmission Risk Interventions    12/24/2022   11:30 AM 05/23/2021    2:13 PM 04/22/2021    3:05 PM  Readmission Risk Prevention Plan  Transportation Screening Complete Complete Complete  Medication Review Oceanographer) Complete Complete Complete  PCP or Specialist appointment within 3-5 days of discharge Complete Complete Complete  HRI or Home Care Consult Complete Complete Complete  SW Recovery Care/Counseling Consult Complete Complete Complete  Palliative Care Screening Not Applicable Not Applicable Not Applicable  Skilled Nursing Facility Not Applicable Not Applicable Not Applicable

## 2023-02-17 NOTE — Progress Notes (Signed)
Went back in to check on patient and hang antibiotic. Patient is itching and IV came out. I told her maybe Dilaudid is too strong and may need a different medication to help with her pain, she said why. That's is when she started waking up for me.

## 2023-02-17 NOTE — Progress Notes (Addendum)
Patient Received Dilaudid Per MAR. It makes her very drowsy. Will respond to my voice and touch but will not take her PO medications. Vitals are stable and she answers my questions. Refused dressing change. She was too drowsy.  Call light is within reach. Education done. Will make Night shift aware.

## 2023-02-17 NOTE — Progress Notes (Addendum)
   Subjective:  Pt is a 47 year old female with PMHx of ESRD on HD, Type II DM c/b gastroparesis, well controlled HIV, hx of gastric ulcers, hx of PVD s/p right BKA who presented to the clinic on 02/15/23 for her stump wound and was directly admitted for further management.  MRI this morning showed acute osteomyelitis involving the distal aspect of the residual fibula and soft tissue wound or ulceration at the distal stump laterally along with adjacent soft tissue edema.  She is currently receiving IV vancomycin and IV cefepime.  Overnight events: N/A  Today, she continues to experience migraine and reported that the Dilaudid has not helped the migraine.  She has been out of her migraine medication at home as well.  Patient also reports that her right residual limb is still sore with pain that  will radiate up the thigh.  She is concerned/upset about her surgery coming up on Friday for the right BKA to AKA for acute osteomyelitis.  Patient also reports diarrhea last night, she experienced fecal incontinence.   Objective:  Vital signs in last 24 hours: Vitals:   02/17/23 0346 02/17/23 0500 02/17/23 0900 02/17/23 1200  BP: (!) 145/66  114/60 (!) 116/55  Pulse: 91  87 90  Resp: 16  16   Temp: 98.1 F (36.7 C)  98.2 F (36.8 C)   TempSrc: Oral  Oral   SpO2: 100%  100%   Weight:  73.5 kg    PE: Cardiac: Regular rate and rhythm, no murmurs auscultated Pulmonary: Normal work of breathing observed Abdominal: Bowel sounds present in 4 quadrants, no tenderness on palpation, soft MSK: Left AKA, right BKA with dry clean dressing on wound.  Tenderness proximal to the dressing. Skin: Warm and dry   Assessment/Plan:  Principal Problem:   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)  #Right residual fibula osteomyelitis MRI of the right lower extremity BKA demonstrated acute osteomyelitis of the residual portion of the fibula along with tissue wound and soft tissue edema -Dr.  Randie Heinz is planning on above-knee amputation on Friday -Continue vancomycin and cefepime antibiotics for the time being        -Blood cultures are currently no growth to date        -Wound cultures demonstrate Pseudomonas aeruginosa: Pan-sensitive, will speak with attending on antibiotic regimen  #Chronic diarrhea -T-helper CD4 cell count is 427, diarrhea from infectious diseases such as Cryptosporidium and MAC is unlikely -C. difficile and GI panel came back negative, will discontinue enteric precautions -Pancreatic elastase and stool osmolality still need collected -Since the GI panel came back negative along with the C. difficile,  -EKG upon presentation does have Qtc prolongation 477, will hold off on imodium for now      -Contacted pharmacist who recommended bulking agents such as psyllium, order is in   #migraines -Contacted nephrology who to guidance on migraine management and dialysis patients -One dose of Excedrin placed   #HIV -Biktarvy restarted  #DMII Continue SSI and 7 units long-lasting insulin:   Dispo: Anticipated discharge in approximately 3-5 day(s).   Faith Rogue, DO 02/17/2023, 1:39 PM Pager: [phone] After 5pm on weekdays and 1pm on weekends: On Call pager [phone]

## 2023-02-18 ENCOUNTER — Encounter (HOSPITAL_COMMUNITY): Payer: Self-pay

## 2023-02-18 ENCOUNTER — Other Ambulatory Visit (HOSPITAL_COMMUNITY): Payer: Self-pay

## 2023-02-18 DIAGNOSIS — T8743 Infection of amputation stump, right lower extremity: Secondary | ICD-10-CM | POA: Diagnosis not present

## 2023-02-18 DIAGNOSIS — M86161 Other acute osteomyelitis, right tibia and fibula: Secondary | ICD-10-CM | POA: Diagnosis not present

## 2023-02-18 DIAGNOSIS — K529 Noninfective gastroenteritis and colitis, unspecified: Secondary | ICD-10-CM | POA: Diagnosis not present

## 2023-02-18 DIAGNOSIS — E1169 Type 2 diabetes mellitus with other specified complication: Secondary | ICD-10-CM | POA: Diagnosis not present

## 2023-02-18 LAB — RENAL FUNCTION PANEL
Albumin: 2.5 g/dL — ABNORMAL LOW (ref 3.5–5.0)
Anion gap: 15 (ref 5–15)
BUN: 30 mg/dL — ABNORMAL HIGH (ref 6–20)
CO2: 21 mmol/L — ABNORMAL LOW (ref 22–32)
Calcium: 9 mg/dL (ref 8.9–10.3)
Chloride: 98 mmol/L (ref 98–111)
Creatinine, Ser: 8.29 mg/dL — ABNORMAL HIGH (ref 0.44–1.00)
GFR, Estimated: 6 mL/min — ABNORMAL LOW (ref >60)
Glucose, Bld: 77 mg/dL (ref 70–99)
Phosphorus: 4.5 mg/dL (ref 2.5–4.6)
Potassium: 3.8 mmol/L (ref 3.5–5.1)
Sodium: 134 mmol/L — ABNORMAL LOW (ref 135–145)

## 2023-02-18 LAB — CBC
HCT: 29.7 % — ABNORMAL LOW (ref 36.0–46.0)
Hemoglobin: 9.1 g/dL — ABNORMAL LOW (ref 12.0–15.0)
MCH: 27.2 pg (ref 26.0–34.0)
MCHC: 30.6 g/dL (ref 30.0–36.0)
MCV: 88.9 fL (ref 80.0–100.0)
Platelets: 294 10*3/uL (ref 150–400)
RBC: 3.34 MIL/uL — ABNORMAL LOW (ref 3.87–5.11)
RDW: 17 % — ABNORMAL HIGH (ref 11.5–15.5)
WBC: 7.8 10*3/uL (ref 4.0–10.5)
nRBC: 0 % (ref 0.0–0.2)

## 2023-02-18 LAB — GLUCOSE, CAPILLARY
Glucose-Capillary: 76 mg/dL (ref 70–99)
Glucose-Capillary: 155 mg/dL — ABNORMAL HIGH (ref 70–99)
Glucose-Capillary: 90 mg/dL (ref 70–99)

## 2023-02-18 MED ORDER — ASPIRIN-ACETAMINOPHEN-CAFFEINE 250-250-65 MG PO TABS
1.0000 | ORAL_TABLET | Freq: Once | ORAL | Status: AC
  Administered 2023-02-18: 1 via ORAL
  Filled 2023-02-18: qty 1

## 2023-02-18 MED ORDER — SODIUM CHLORIDE 0.9 % IV SOLN
2.0000 g | INTRAVENOUS | Status: DC
  Administered 2023-02-18 – 2023-02-20 (×2): 2 g via INTRAVENOUS
  Filled 2023-02-18 (×2): qty 12.5

## 2023-02-18 MED ORDER — HEPARIN SODIUM (PORCINE) 1000 UNIT/ML IJ SOLN
INTRAMUSCULAR | Status: AC
  Filled 2023-02-18: qty 4

## 2023-02-18 NOTE — Progress Notes (Signed)
Subjective:   Pt is a 47 year old female with PMHx of ESRD on HD, Type II DM c/b gastroparesis, well controlled HIV, hx of gastric ulcers, hx of PVD s/p right BKA who presented to the clinic on 02/15/23 for her stump wound and was directly admitted for further management.  MRI this morning showed acute osteomyelitis involving the distal aspect of the residual fibula and soft tissue wound or ulceration at the distal stump laterally along with adjacent soft tissue edema.  She is currently receiving IV vancomycin and IV cefepime and will undergo a right AKA tomorrow.  Overnight events: None  Today, the patient did not participate in conversation.  However she did shake her head yes to a migraine.  Reluctant to answer any other questions.  Objective:  Vital signs in last 24 hours: Vitals:   02/18/23 1200 02/18/23 1213 02/18/23 1300 02/18/23 1309  BP: (!) 93/58 (!) 106/33 (!) 151/72 (!) 151/72  Pulse: 90 88 92 92  Resp: (!) 30 11 18 16   Temp:   98.4 F (36.9 C) 98.4 F (36.9 C)  TempSrc:   Axillary Oral  SpO2: 100% 99% 100% 100%  Weight:  69.8 kg    PE: General:sitting in bed, falling asleep Cardiac:Deferred Pulmonary:clear bilateral, normal work of breathing observed     Latest Ref Rng & Units 02/18/2023    8:02 AM 02/17/2023    3:07 AM 02/16/2023   10:21 AM  CBC  WBC 4.0 - 10.5 K/uL 7.8  9.4  5.3   Hemoglobin 12.0 - 15.0 g/dL 9.1  9.5  8.3   Hematocrit 36.0 - 46.0 % 29.7  31.1  27.5   Platelets 150 - 400 K/uL 294  314  297        Latest Ref Rng & Units 02/18/2023    8:02 AM 02/17/2023    3:07 AM 02/16/2023   10:20 AM  CMP  Glucose 70 - 99 mg/dL 77  161  096   BUN 6 - 20 mg/dL 30  20  39   Creatinine 0.44 - 1.00 mg/dL 0.45  4.09  81.19   Sodium 135 - 145 mmol/L 134  132  136   Potassium 3.5 - 5.1 mmol/L 3.8  4.0  4.4   Chloride 98 - 111 mmol/L 98  98  101   CO2 22 - 32 mmol/L 21  21  19    Calcium 8.9 - 10.3 mg/dL 9.0  8.5  8.2     Assessment/Plan:  Principal Problem:    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)  #Right residual fibular acute osteomyelitis MRI of the right lower extremity BKA demonstrated acute osteomyelitis of the residual portion of the fibula along with tissue wound and soft tissue edema  -Of the knee amputation tomorrow with vascular surgery: NPO at Midnight   -Blood cultures:NGTD  -Wound cultures: Pan-sensitive pseudomonas Aeruginosa -Continue IV vancomycin and cefepime for the time being  #Chronic diarrhea Lack of fever and CD count above 200 makes infectious diarrhea unlikely at the moment -Pancreatic fecal elastase and stool osmolality collected, pending -Patient refused agents such as psyllium -Avoid Imodium due to QT prolongation at 477  #Migraines Patient refused medications last night, will place an order prn for today   Patient was reluctant to converse during exam today.  Will stop back later after she finishes dialysis.    Prior to Admission Living Arrangement: living with mom Anticipated Discharge Location: home Barriers to Discharge:medical treatment Dispo: Anticipated discharge  in approximately 3-5 day(s).   Faith Rogue, DO 02/18/2023, 1:18 PM Pager: [phone] After 5pm on weekdays and 1pm on weekends: On Call pager [phone]

## 2023-02-18 NOTE — Progress Notes (Signed)
  Progress Note    02/18/2023 7:37 AM * No surgery date entered *   Osteomyelitis and wound of right BKA now requiring BKA revision to right AKA She is scheduled for right AKA tomorrow in OR with Dr. Randie Heinz NPO after midnight Consent order placed She did not have any questions regarding her surgery tomorrow   Graceann Congress, PA-C [facility] [phone] 02/18/2023 7:37 AM

## 2023-02-18 NOTE — Progress Notes (Signed)
[facility] Progress Note   Subjective: Seen on HD. Sleeping for the most part. UF as tolerated. OR tomorrow.      Objective Vitals:   02/18/23 0900 02/18/23 0930 02/18/23 1000 02/18/23 1001  BP: (!) 142/56 (!) 112/101 (!) 140/103 (!) 117/41  Pulse: 86 89 96 93  Resp:      Temp:      TempSrc:      SpO2: 100% 100% 100% 100%  Weight:       Physical Exam General: Chronically ill appearing female in NAD Heart: RRR NO M/R/G Lungs: CTAB No WOB Abdomen: NABS, NT Extremities: R BKA wrapped/L AKA. No stump edema Dialysis Access: RIJ TDC drsg intact. L AVG + T/B  Additional Objective Labs: Basic Metabolic Panel: Recent Labs  Lab 02/16/23 1020 02/17/23 0307 02/18/23 0802  NA 136 132* 134*  K 4.4 4.0 3.8  CL 101 98 98  CO2 19* 21* 21*  GLUCOSE 183* 163* 77  BUN 39* 20 30*  CREATININE 10.09* 5.54* 8.29*  CALCIUM 8.2* 8.5* 9.0  PHOS 6.9* 4.2 4.5   Liver Function Tests: Recent Labs  Lab 02/16/23 1020 02/17/23 0307 02/18/23 0802  ALBUMIN 2.6* 2.7* 2.5*   No results for input(s): "LIPASE", "AMYLASE" in the last 168 hours. CBC: Recent Labs  Lab 02/15/23 1715 02/16/23 1021 02/17/23 0307 02/18/23 0802  WBC 5.5 5.3 9.4 7.8  NEUTROABS 2.9  --   --   --   HGB 8.5* 8.3* 9.5* 9.1*  HCT 28.1* 27.5* 31.1* 29.7*  MCV 87.3 89.3 88.1 88.9  PLT 320 297 314 294   Blood Culture    Component Value Date/Time   SDES BLOOD RIGHT HAND 02/15/2023 1814   SPECREQUEST  02/15/2023 1814    BOTTLES DRAWN AEROBIC AND ANAEROBIC Blood Culture adequate volume   CULT  02/15/2023 1814    NO GROWTH 3 DAYS Performed at [facility], [address], [place], [place] [address]    REPTSTATUS PENDING 02/15/2023 1814    Cardiac Enzymes: No results for input(s): "CKTOTAL", "CKMB", "CKMBINDEX", "TROPONINI" in the last 168 hours. CBG: Recent Labs  Lab 02/16/23 2114 02/17/23 0845 02/17/23 1210 02/17/23 1629 02/17/23 2123  GLUCAP 172* 137* 131* 98 100*   Iron  Studies: No results for input(s): "IRON", "TIBC", "TRANSFERRIN", "FERRITIN" in the last 72 hours. @lablastinr3 @ Studies/Results: No results found. Medications:  ceFEPime (MAXIPIME) IV     vancomycin 750 mg (02/16/23 1530)    amLODipine  10 mg Oral Daily   bictegravir-emtricitabine-tenofovir AF  1 tablet Oral Q supper   calcitRIOL  1 mcg Oral Q T,Th,Sat-1800   calcium acetate  1,334 mg Oral TID WC   Chlorhexidine Gluconate Cloth  6 each Topical Q0600   Chlorhexidine Gluconate Cloth  6 each Topical Q0600   darbepoetin (ARANESP) injection - DIALYSIS  150 mcg Subcutaneous Q Tue-1800   feeding supplement (NEPRO CARB STEADY)  237 mL Oral BID BM   heparin  5,000 Units Subcutaneous Q8H   heparin sodium (porcine)        HYDROmorphone (DILAUDID) injection  1 mg Intravenous Q6H   insulin aspart  0-5 Units Subcutaneous QHS   insulin aspart  0-9 Units Subcutaneous TID WC   insulin glargine-yfgn  7 Units Subcutaneous Daily   irbesartan  300 mg Oral Daily   pantoprazole  40 mg Oral BID   psyllium  1 packet Oral Daily     OP HD: TTS [facility]  4h  400/1.5   71kg  2K/3Ca bath  RIJ TDC / revised AVF LUA Heparin none - last OP HD 7/06, post wt 71.7kg - started 7/06 w/ one needle in AVF and other line in the catheter - mircera 150 mcg IV q 2 wks, last was on 01/14/23 - rocaltrol 1.0 mcg po tiw     Assessment/ Plan: 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 Getting IV abx vanc/ rocephin. Not appearing septic. Planning for R AKA per Dr. Randie Heinz 02/19/2023.   Chronic diarrhea - for 3 mos. No abd pain. W/u per pmd. C diff negative. GI panel pending.  ESRD - on HD TTS. Next HD 02/20/2023. Use AVG with HD.  HTN - BP controlled. cont home bp meds per pmd.  Volume - up 6kg by wts, no sig edema, no resp issues. Max UF 02/16/2023 with Net UF 4 liters. Still above OP EDW. Continue to lower as tolerated.  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 Will order 150mg  darbe weekly on  Tuesday while here.  MBD ckd - CCa and phos in range. Cont po vdra and phoslo as binder.  HIV - on biktarvy PAD - w/ L AKA and R BKA DM2 - per pmd  Racine Erby H. Jacquese Hackman NP-C 02/18/2023, 10:22 AM  [employer] [phone]

## 2023-02-18 NOTE — Progress Notes (Signed)
Stopped to speak with patient this evening. Patient wasn't participating in conversation with me again. I offered reassurance with tomorrow's surgery.

## 2023-02-18 NOTE — Plan of Care (Signed)

## 2023-02-18 NOTE — Progress Notes (Signed)
   02/18/23 1213  Vitals  Temp  (pt refused, Rn aware)  Pulse Rate 88  Resp 11  BP (!) 106/33  SpO2 99 %  O2 Device Room Air  Weight 69.8 kg  Type of Weight Post-Dialysis  Post Treatment  Dialyzer Clearance Lightly streaked  Duration of HD Treatment -hour(s) 4 hour(s)  Hemodialysis Intake (mL) 0 mL  Liters Processed 93.5  Fluid Removed (mL) 3000 mL  Tolerated HD Treatment Yes   Received patient in bed to unit.  Alert and oriented.  Informed consent signed and in chart.   TX duration:4hrs  Patient tolerated well.  Transported back to the room  Alert, without acute distress.  Hand-off given to patient's nurse.   Access used: [facility] Access issues: none  Total UF removed: 3L Medication(s) given: Vanc 750mg     [facility]

## 2023-02-18 NOTE — Progress Notes (Signed)
Pharmacy Antibiotic Note  Kathy Frank is a 47 y.o. female admitted on 02/15/2023 with R stump wound. Pharmacy has been consulted for vancomycin and cefepime dosing. Noted pans for AKA 7/12.    Plan: Begin cefepime 2 gm IV every Tues-Thurs-Sat - increased from 1 g q24h Continue Vancomycin 750 mg IV every Tues-Thurs-Sat  Will f/u HD schedule and tolerance, micro data, and pt's clinical condition Pre-HD Vanc levels prn F/U post-op abx plans   Weight: 69.8 kg (153 lb 14.1 oz)  Temp (24hrs), Avg:98.3 F (36.8 C), Min:98 F (36.7 C), Max:98.6 F (37 C)  Recent Labs  Lab 02/15/23 1715 02/16/23 1020 02/16/23 1021 02/17/23 0307 02/18/23 0802  WBC 5.5  --  5.3 9.4 7.8  CREATININE 9.55* 10.09*  --  5.54* 8.29*    Estimated Creatinine Clearance: 8.3 mL/min (A) (by C-G formula based on SCr of 8.29 mg/dL (H)).    No Known Allergies  Antimicrobials this admission: 7/8 Vanc >>  7/8 Cefepime >>   Microbiology results: 7/8 BCx: NGTD x3 7/8 Stump wound Cx: moderate GNRs>>PsA (Pan-S) 7/8 GI Panel: negative  7/8 C. Difficile PCR: negative   Thank you for allowing pharmacy to be a part of this patient's care.  Jani Gravel, PharmD Clinical Pharmacist  02/18/2023 1:26 PM

## 2023-02-18 NOTE — Addendum Note (Signed)
Addended by: Dickie La on: 02/18/2023 12:26 PM   Modules accepted: Level of Service

## 2023-02-18 NOTE — Progress Notes (Signed)
Internal Medicine Clinic Attending  Case discussed with the resident at the time of the visit.  We reviewed the resident's history and exam and pertinent patient test results.  I agree with the assessment, diagnosis, and plan of care documented in the resident's note.  

## 2023-02-19 ENCOUNTER — Encounter (HOSPITAL_COMMUNITY): Payer: Self-pay | Admitting: Internal Medicine

## 2023-02-19 ENCOUNTER — Inpatient Hospital Stay (HOSPITAL_COMMUNITY): Payer: Medicare Other | Admitting: Anesthesiology

## 2023-02-19 ENCOUNTER — Other Ambulatory Visit: Payer: Self-pay

## 2023-02-19 ENCOUNTER — Encounter (HOSPITAL_COMMUNITY)
Admission: AD | Disposition: A | Discharge status: Home Health | Payer: Self-pay | Source: Ambulatory Visit | Attending: Internal Medicine

## 2023-02-19 DIAGNOSIS — M86661 Other chronic osteomyelitis, right tibia and fibula: Secondary | ICD-10-CM | POA: Diagnosis not present

## 2023-02-19 DIAGNOSIS — N186 End stage renal disease: Secondary | ICD-10-CM | POA: Diagnosis not present

## 2023-02-19 DIAGNOSIS — T8743 Infection of amputation stump, right lower extremity: Secondary | ICD-10-CM

## 2023-02-19 DIAGNOSIS — Z992 Dependence on renal dialysis: Secondary | ICD-10-CM

## 2023-02-19 DIAGNOSIS — E1169 Type 2 diabetes mellitus with other specified complication: Secondary | ICD-10-CM | POA: Diagnosis not present

## 2023-02-19 DIAGNOSIS — I120 Hypertensive chronic kidney disease with stage 5 chronic kidney disease or end stage renal disease: Secondary | ICD-10-CM

## 2023-02-19 DIAGNOSIS — M86161 Other acute osteomyelitis, right tibia and fibula: Secondary | ICD-10-CM | POA: Diagnosis not present

## 2023-02-19 DIAGNOSIS — I25110 Atherosclerotic heart disease of native coronary artery with unstable angina pectoris: Secondary | ICD-10-CM | POA: Diagnosis not present

## 2023-02-19 DIAGNOSIS — E1122 Type 2 diabetes mellitus with diabetic chronic kidney disease: Secondary | ICD-10-CM | POA: Diagnosis not present

## 2023-02-19 HISTORY — PX: AMPUTATION: SHX166

## 2023-02-19 LAB — GLUCOSE, CAPILLARY
Glucose-Capillary: 99 mg/dL (ref 70–99)
Glucose-Capillary: 139 mg/dL — ABNORMAL HIGH (ref 70–99)
Glucose-Capillary: 116 mg/dL — ABNORMAL HIGH (ref 70–99)
Glucose-Capillary: 125 mg/dL — ABNORMAL HIGH (ref 70–99)
Glucose-Capillary: 151 mg/dL — ABNORMAL HIGH (ref 70–99)

## 2023-02-19 LAB — CBC
HCT: 33.2 % — ABNORMAL LOW (ref 36.0–46.0)
Hemoglobin: 10.5 g/dL — ABNORMAL LOW (ref 12.0–15.0)
MCH: 27.8 pg (ref 26.0–34.0)
MCHC: 31.6 g/dL (ref 30.0–36.0)
MCV: 87.8 fL (ref 80.0–100.0)
Platelets: 321 10*3/uL (ref 150–400)
RBC: 3.78 MIL/uL — ABNORMAL LOW (ref 3.87–5.11)
RDW: 16.9 % — ABNORMAL HIGH (ref 11.5–15.5)
WBC: 7.1 10*3/uL (ref 4.0–10.5)
nRBC: 0 % (ref 0.0–0.2)

## 2023-02-19 LAB — RENAL FUNCTION PANEL
Albumin: 3 g/dL — ABNORMAL LOW (ref 3.5–5.0)
Anion gap: 17 — ABNORMAL HIGH (ref 5–15)
BUN: 15 mg/dL (ref 6–20)
CO2: 21 mmol/L — ABNORMAL LOW (ref 22–32)
Calcium: 9.6 mg/dL (ref 8.9–10.3)
Chloride: 94 mmol/L — ABNORMAL LOW (ref 98–111)
Creatinine, Ser: 5.42 mg/dL — ABNORMAL HIGH (ref 0.44–1.00)
GFR, Estimated: 9 mL/min — ABNORMAL LOW (ref >60)
Glucose, Bld: 94 mg/dL (ref 70–99)
Phosphorus: 3.6 mg/dL (ref 2.5–4.6)
Potassium: 4.6 mmol/L (ref 3.5–5.1)
Sodium: 132 mmol/L — ABNORMAL LOW (ref 135–145)

## 2023-02-19 LAB — HCG, SERUM, QUALITATIVE: Preg, Serum: NEGATIVE

## 2023-02-19 LAB — CULTURE, BLOOD (ROUTINE X 2)

## 2023-02-19 SURGERY — AMPUTATION, ABOVE KNEE
Anesthesia: General | Site: Knee | Laterality: Right

## 2023-02-19 MED ORDER — ASPIRIN-ACETAMINOPHEN-CAFFEINE 250-250-65 MG PO TABS
1.0000 | ORAL_TABLET | Freq: Once | ORAL | Status: DC
  Filled 2023-02-19: qty 1

## 2023-02-19 MED ORDER — FENTANYL CITRATE (PF) 100 MCG/2ML IJ SOLN
INTRAMUSCULAR | Status: AC
  Filled 2023-02-19: qty 2

## 2023-02-19 MED ORDER — CHLORHEXIDINE GLUCONATE 0.12 % MT SOLN
OROMUCOSAL | Status: AC
  Administered 2023-02-19: 15 mL
  Filled 2023-02-19: qty 15

## 2023-02-19 MED ORDER — SODIUM CHLORIDE 0.9 % IV SOLN: INTRAVENOUS | Status: DC

## 2023-02-19 MED ORDER — ONDANSETRON HCL 4 MG/2ML IJ SOLN
INTRAMUSCULAR | Status: AC
  Filled 2023-02-19: qty 2

## 2023-02-19 MED ORDER — CHLORHEXIDINE GLUCONATE CLOTH 2 % EX PADS
6.0000 | MEDICATED_PAD | Freq: Every day | CUTANEOUS | Status: DC
  Administered 2023-02-20 – 2023-02-22 (×3): 6 via TOPICAL

## 2023-02-19 MED ORDER — FENTANYL CITRATE (PF) 250 MCG/5ML IJ SOLN
INTRAMUSCULAR | Status: AC
  Filled 2023-02-19: qty 5

## 2023-02-19 MED ORDER — 0.9 % SODIUM CHLORIDE (POUR BTL) OPTIME
TOPICAL | Status: DC | PRN
  Administered 2023-02-19: 1000 mL

## 2023-02-19 MED ORDER — ACETAMINOPHEN 500 MG PO TABS
1000.0000 mg | ORAL_TABLET | Freq: Three times a day (TID) | ORAL | Status: DC
  Administered 2023-02-19 – 2023-02-22 (×8): 1000 mg via ORAL
  Filled 2023-02-19 (×9): qty 2

## 2023-02-19 MED ORDER — OXYCODONE HCL 5 MG/5ML PO SOLN: 5.0000 mg | Freq: Once | ORAL | Status: DC | PRN

## 2023-02-19 MED ORDER — ACETAMINOPHEN 500 MG PO TABS
1000.0000 mg | ORAL_TABLET | Freq: Once | ORAL | Status: DC
  Filled 2023-02-19: qty 2

## 2023-02-19 MED ORDER — FENTANYL CITRATE (PF) 250 MCG/5ML IJ SOLN
INTRAMUSCULAR | Status: DC | PRN
  Administered 2023-02-19 (×3): 50 ug via INTRAVENOUS

## 2023-02-19 MED ORDER — OXYCODONE HCL 5 MG PO TABS: 5.0000 mg | ORAL_TABLET | Freq: Once | ORAL | Status: DC | PRN

## 2023-02-19 MED ORDER — ORAL CARE MOUTH RINSE: 15.0000 mL | Freq: Once | OROMUCOSAL | Status: DC

## 2023-02-19 MED ORDER — PROMETHAZINE HCL 25 MG/ML IJ SOLN: 6.2500 mg | INTRAMUSCULAR | Status: DC | PRN

## 2023-02-19 MED ORDER — HYDROMORPHONE HCL 2 MG PO TABS
1.0000 mg | ORAL_TABLET | ORAL | Status: DC | PRN
  Administered 2023-02-19 – 2023-02-22 (×11): 1 mg via ORAL
  Filled 2023-02-19 (×11): qty 1

## 2023-02-19 MED ORDER — PROPOFOL 10 MG/ML IV BOLUS
INTRAVENOUS | Status: DC | PRN
Start: 2023-02-19 — End: 2023-02-19
  Administered 2023-02-19: 120 mg via INTRAVENOUS

## 2023-02-19 MED ORDER — PROPOFOL 10 MG/ML IV BOLUS
INTRAVENOUS | Status: AC
  Filled 2023-02-19: qty 20

## 2023-02-19 MED ORDER — CEFAZOLIN SODIUM-DEXTROSE 2-3 GM-%(50ML) IV SOLR
INTRAVENOUS | Status: DC | PRN
  Administered 2023-02-19: 2 g via INTRAVENOUS

## 2023-02-19 MED ORDER — LIDOCAINE 2% (20 MG/ML) 5 ML SYRINGE
INTRAMUSCULAR | Status: DC | PRN
  Administered 2023-02-19: 60 mg via INTRAVENOUS

## 2023-02-19 MED ORDER — CHLORHEXIDINE GLUCONATE 0.12 % MT SOLN: 15.0000 mL | Freq: Once | OROMUCOSAL | Status: DC

## 2023-02-19 MED ORDER — FENTANYL CITRATE (PF) 100 MCG/2ML IJ SOLN
25.0000 ug | INTRAMUSCULAR | Status: DC | PRN
  Administered 2023-02-19 (×3): 25 ug via INTRAVENOUS

## 2023-02-19 MED ORDER — ONDANSETRON HCL 4 MG/2ML IJ SOLN
INTRAMUSCULAR | Status: DC | PRN
  Administered 2023-02-19: 4 mg via INTRAVENOUS

## 2023-02-19 SURGICAL SUPPLY — 68 items
BAG COUNTER SPONGE SURGICOUNT (BAG) ×1 IMPLANT
BAG SPNG CNTER NS LX DISP (BAG) ×1
BANDAGE ESMARK 6X9 LF (GAUZE/BANDAGES/DRESSINGS) ×1 IMPLANT
BLADE SAGITTAL (BLADE)
BLADE SAGITTAL 25.0X1.19X90 (BLADE) IMPLANT
BLADE SAW GIGLI 510 (BLADE) ×1 IMPLANT
BLADE SAW THK.89X75X18XSGTL (BLADE) IMPLANT
BNDG CMPR 5X4 KNIT ELC UNQ LF (GAUZE/BANDAGES/DRESSINGS) ×1
BNDG CMPR 5X6 CHSV STRCH STRL (GAUZE/BANDAGES/DRESSINGS) ×1
BNDG CMPR 5X62 HK CLSR LF (GAUZE/BANDAGES/DRESSINGS) ×1
BNDG CMPR 6 X 5 YARDS HK CLSR (GAUZE/BANDAGES/DRESSINGS) ×1
BNDG CMPR 6"X 5 YARDS HK CLSR (GAUZE/BANDAGES/DRESSINGS) ×1
BNDG CMPR 9X6 STRL LF SNTH (GAUZE/BANDAGES/DRESSINGS) ×1
BNDG COHESIVE 6X5 TAN ST LF (GAUZE/BANDAGES/DRESSINGS) ×1 IMPLANT
BNDG ELASTIC 4INX 5YD STR LF (GAUZE/BANDAGES/DRESSINGS) IMPLANT
BNDG ELASTIC 4X5.8 VLCR STR LF (GAUZE/BANDAGES/DRESSINGS) ×1 IMPLANT
BNDG ELASTIC 6INX 5YD STR LF (GAUZE/BANDAGES/DRESSINGS) IMPLANT
BNDG ELASTIC 6X5.8 VLCR STR LF (GAUZE/BANDAGES/DRESSINGS) ×1 IMPLANT
BNDG ESMARK 6X9 LF (GAUZE/BANDAGES/DRESSINGS) ×1
BNDG GAUZE DERMACEA FLUFF 4 (GAUZE/BANDAGES/DRESSINGS) ×1 IMPLANT
BNDG GZE DERMACEA 4 6PLY (GAUZE/BANDAGES/DRESSINGS) ×2
BUR DISC 0.8X25 (BURR) IMPLANT
CANISTER SUCT 3000ML PPV (MISCELLANEOUS) ×1 IMPLANT
CLIP LIGATING EXTRA MED SLVR (CLIP) ×1 IMPLANT
CLIP LIGATING EXTRA SM BLUE (MISCELLANEOUS) ×1 IMPLANT
COVER SURGICAL LIGHT HANDLE (MISCELLANEOUS) ×1 IMPLANT
CUFF TOURN SGL QUICK 24 (TOURNIQUET CUFF)
CUFF TOURN SGL QUICK 34 (TOURNIQUET CUFF)
CUFF TRNQT CYL 24X4X16.5-23 (TOURNIQUET CUFF) IMPLANT
CUFF TRNQT CYL 34X4.125X (TOURNIQUET CUFF) IMPLANT
DRAIN CHANNEL 19F RND (DRAIN) IMPLANT
DRAPE DERMATAC (DRAPES) IMPLANT
DRAPE HALF SHEET 40X57 (DRAPES) ×1 IMPLANT
DRAPE INCISE IOBAN 66X45 STRL (DRAPES) IMPLANT
DRAPE ORTHO SPLIT 77X108 STRL (DRAPES) ×2
DRAPE SURG ORHT 6 SPLT 77X108 (DRAPES) ×2 IMPLANT
DRSG ADAPTIC 3X8 NADH LF (GAUZE/BANDAGES/DRESSINGS) ×1 IMPLANT
ELECT CAUTERY BLADE 6.4 (BLADE) ×1 IMPLANT
ELECT REM PT RETURN 9FT ADLT (ELECTROSURGICAL) ×1
ELECTRODE REM PT RTRN 9FT ADLT (ELECTROSURGICAL) ×1 IMPLANT
EVACUATOR SILICONE 100CC (DRAIN) IMPLANT
GAUZE SPONGE 4X4 12PLY STRL (GAUZE/BANDAGES/DRESSINGS) ×1 IMPLANT
GLOVE BIO SURGEON STRL SZ7.5 (GLOVE) ×1 IMPLANT
GOWN STRL REUS W/ TWL LRG LVL3 (GOWN DISPOSABLE) ×2 IMPLANT
GOWN STRL REUS W/ TWL XL LVL3 (GOWN DISPOSABLE) ×1 IMPLANT
GOWN STRL REUS W/TWL LRG LVL3 (GOWN DISPOSABLE) ×2
GOWN STRL REUS W/TWL XL LVL3 (GOWN DISPOSABLE) ×1
KIT BASIN OR (CUSTOM PROCEDURE TRAY) ×1 IMPLANT
KIT TURNOVER KIT B (KITS) ×1 IMPLANT
NS IRRIG 1000ML POUR BTL (IV SOLUTION) ×1 IMPLANT
PACK GENERAL/GYN (CUSTOM PROCEDURE TRAY) ×1 IMPLANT
PAD ARMBOARD 7.5X6 YLW CONV (MISCELLANEOUS) ×2 IMPLANT
POWDER SURGICEL 3.0 GRAM (HEMOSTASIS) IMPLANT
RASP HELIOCORDIAL MED (MISCELLANEOUS) IMPLANT
STAPLER VISISTAT 35W (STAPLE) ×1 IMPLANT
STOCKINETTE IMPERVIOUS LG (DRAPES) ×1 IMPLANT
SUT ETHILON 3 0 PS 1 (SUTURE) IMPLANT
SUT SILK 0 TIES 10X30 (SUTURE) ×1 IMPLANT
SUT SILK 2 0 (SUTURE) ×1
SUT SILK 2-0 18XBRD TIE 12 (SUTURE) ×1 IMPLANT
SUT SILK 3 0 (SUTURE)
SUT SILK 3-0 18XBRD TIE 12 (SUTURE) IMPLANT
SUT VIC AB 2-0 CT1 18 (SUTURE) ×2 IMPLANT
SUT VIC AB 2-0 CT1 27 (SUTURE) ×1
SUT VIC AB 2-0 CT1 TAPERPNT 27 (SUTURE) IMPLANT
TOWEL GREEN STERILE (TOWEL DISPOSABLE) ×2 IMPLANT
UNDERPAD 30X36 HEAVY ABSORB (UNDERPADS AND DIAPERS) ×1 IMPLANT
WATER STERILE IRR 1000ML POUR (IV SOLUTION) ×1 IMPLANT

## 2023-02-19 NOTE — Progress Notes (Addendum)
  Progress Note    02/19/2023 7:37 AM  Subjective:  No complaints.  No questions   Vitals:   02/18/23 2128 02/19/23 0445  BP: (!) 153/74 132/83  Pulse: 86   Resp: 16 16  Temp: 99.1 F (37.3 C) 98.5 F (36.9 C)  SpO2: 100% 100%   Physical Exam: Lungs:  non labored Incisions:  dressing in place R BKA Neurologic: A&O  CBC    Component Value Date/Time   WBC 7.1 02/19/2023 0709   RBC 3.78 (L) 02/19/2023 0709   HGB 10.5 (L) 02/19/2023 0709   HGB 9.2 (L) 10/19/2022 1204   HCT 33.2 (L) 02/19/2023 0709   HCT 28.6 (L) 10/19/2022 1204   PLT 321 02/19/2023 0709   PLT 343 10/19/2022 1204   MCV 87.8 02/19/2023 0709   MCV 91 10/19/2022 1204   MCH 27.8 02/19/2023 0709   MCHC 31.6 02/19/2023 0709   RDW 16.9 (H) 02/19/2023 0709   RDW 16.0 (H) 10/19/2022 1204   LYMPHSABS 1.2 02/15/2023 1715   LYMPHSABS 1.6 12/23/2017 1558   MONOABS 0.5 02/15/2023 1715   EOSABS 0.8 (H) 02/15/2023 1715   EOSABS 0.2 12/23/2017 1558   BASOSABS 0.1 02/15/2023 1715   BASOSABS 0.1 12/23/2017 1558    BMET    Component Value Date/Time   NA 134 (L) 02/18/2023 0802   NA 138 12/23/2017 1558   K 3.8 02/18/2023 0802   CL 98 02/18/2023 0802   CO2 21 (L) 02/18/2023 0802   GLUCOSE 77 02/18/2023 0802   BUN 30 (H) 02/18/2023 0802   BUN 28 (H) 12/23/2017 1558   CREATININE 8.29 (H) 02/18/2023 0802   CREATININE 12.07 (H) 05/29/2020 1155   CALCIUM 9.0 02/18/2023 0802   CALCIUM 8.5 (L) 11/25/2018 0309   GFRNONAA 6 (L) 02/18/2023 0802   GFRNONAA 79 11/14/2014 1154   GFRAA 5 (L) 01/06/2020 1329   GFRAA >89 11/14/2014 1154    INR    Component Value Date/Time   INR 1.1 12/23/2022 1623     Intake/Output Summary (Last 24 hours) at 02/19/2023 0737 Last data filed at 02/19/2023 0037 Gross per 24 hour  Intake 375.32 ml  Output 3000 ml  Net -2624.68 ml     Assessment/Plan:  47 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 Randie Heinz today.  No questions  regarding surgery but willing to proceed.  Continue npo.  Consent signed   Emilie Rutter, PA-C [facility] [phone] 02/19/2023 7:37 AM   I have independently interviewed and examined patient and agree with PA assessment and plan above.  Plan for right above-knee amputation today.  All questions were answered.  Debbie Bellucci C. Randie Heinz, MD [facility] [place] Office: [phone] Pager: [phone]

## 2023-02-19 NOTE — Anesthesia Procedure Notes (Signed)
Procedure Name: LMA Insertion Date/Time: 02/19/2023 2:19 PM  Performed by: Sheppard Evens, CRNAPre-anesthesia Checklist: Patient identified, Emergency Drugs available, Suction available and Patient being monitored Patient Re-evaluated:Patient Re-evaluated prior to induction Oxygen Delivery Method: Circle System Utilized Preoxygenation: Pre-oxygenation with 100% oxygen Induction Type: IV induction Ventilation: Mask ventilation without difficulty LMA: LMA inserted LMA Size: 4.0 Number of attempts: 1 Airway Equipment and Method: Bite block Placement Confirmation: positive ETCO2 Tube secured with: Tape Dental Injury: Teeth and Oropharynx as per pre-operative assessment

## 2023-02-19 NOTE — Care Management Important Message (Signed)
Important Message  Patient Details  Name: KERYL BREDESEN MRN: [id number] Date of Birth: 01-10-1976   Medicare Important Message Given:  Yes     Sherilyn Banker 02/19/2023, 3:51 PM

## 2023-02-19 NOTE — Transfer of Care (Signed)
Immediate Anesthesia Transfer of Care Note  Patient: Kathy Frank  Procedure(s) Performed: CONVERSION TO ABOVE KNEE AMPUTATION (Right: Knee)  Patient Location: PACU  Anesthesia Type:General  Level of Consciousness: responds to stimulation  Airway & Oxygen Therapy: Patient Spontanous Breathing  Post-op Assessment: Report given to RN and Post -op Vital signs reviewed and stable  Post vital signs: Reviewed and stable  Last Vitals:  Vitals Value Taken Time  BP 158/78 02/19/23 1516  Temp    Pulse 81 02/19/23 1518  Resp 12 02/19/23 1518  SpO2 98 % 02/19/23 1518  Vitals shown include unfiled device data.  Last Pain:  Vitals:   02/19/23 1318  TempSrc:   PainSc: 10-Worst pain ever      Patients Stated Pain Goal: 0 (02/17/23 2120)  Complications: No notable events documented.

## 2023-02-19 NOTE — Progress Notes (Addendum)
[name]   Subjective: went to OR for revision of BKA to AKA per Dr. Randie Heinz. HD tomorrow. No heparin.   Seen in PACU.   Objective Vitals:   02/18/23 2128 02/19/23 0445 02/19/23 0500 02/19/23 0755  BP: (!) 153/74 132/83  124/81  Pulse: 86   80  Resp: 16 16  16   Temp: 99.1 F (37.3 C) 98.5 F (36.9 C)  97.9 F (36.6 C)  TempSrc: Oral Oral  Oral  SpO2: 100% 100%  100%  Weight:   69.8 kg    Physical Exam General: Chronically ill appearing female in NAD Heart: RRR NO M/R/G Lungs: CTAB No WOB Abdomen: NABS, NT Extremities: R AKA wrapped, prior L AKA. No edema Dialysis Access: RIJ TDC drsg intact. 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 Getting IV abx vanc/ rocephin. Not appearing septic. Planning for R AKA per Dr. Randie Heinz 02/19/2023.   Chronic diarrhea - for 3 mos. No abd pain. W/u per pmd. C diff negative. GI panel pending.  ESRD - on HD TTS. Next HD 02/20/2023. Use AVG with HD.  HTN - BP controlled. cont home bp meds per pmd.  Volume - HD 02/18/2023 Net 3.0 liters. Wt this AM 69.8 kg. Finally under OP EDW. Will need lower EDW post revision of BKA. UF as tolerated.  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 Anticipate fall in HGB post op. darbe weekly on Tuesday while here.  MBD ckd - CCa and phos in range. Cont po vdra and phoslo as binder.  HIV - on biktarvy PAD - S/P bilateral amputations DM2 - per pmd  Kathy H. Brown NP-C 02/19/2023, 3:05 PM   [facility] [phone]  Pt seen, examined and agree w A/P as above.  Kathy Moselle MD  [facility] 02/19/2023, 4:09 PM   Medications:  sodium chloride     [MAR Hold]  ceFEPime (MAXIPIME) IV Stopped (02/18/23 1851)   [MAR Hold] vancomycin 150 mL/hr at 02/19/23 0037    acetaminophen  1,000 mg Oral Once   [MAR Hold] amLODipine  10 mg Oral Daily   aspirin-acetaminophen-caffeine  1 tablet Oral Once   [MAR Hold] bictegravir-emtricitabine-tenofovir AF  1 tablet Oral Q supper   [MAR Hold] calcitRIOL  1 mcg Oral Q T,Th,Sat-1800   [MAR Hold] calcium acetate  1,334 mg Oral TID WC   chlorhexidine  15 mL Mouth/Throat Once   Or   mouth rinse  15 mL Mouth Rinse Once   [MAR Hold] Chlorhexidine Gluconate Cloth  6 each Topical Q0600   [MAR Hold] Chlorhexidine Gluconate Cloth  6 each Topical Q0600   [MAR Hold] darbepoetin (ARANESP) injection - DIALYSIS  150 mcg Subcutaneous Q Tue-1800   [MAR Hold] feeding supplement (NEPRO CARB STEADY)  237 mL Oral BID BM   [MAR Hold] heparin  5,000 Units Subcutaneous Q8H   [MAR Hold] insulin aspart  0-5 Units Subcutaneous QHS   [MAR Hold] insulin aspart  0-9 Units Subcutaneous TID WC   [MAR Hold] insulin glargine-yfgn  7 Units Subcutaneous Daily   [  MAR Hold] irbesartan  300 mg Oral Daily   [MAR Hold] pantoprazole  40 mg Oral BID   [MAR Hold] psyllium  1 packet Oral Daily

## 2023-02-19 NOTE — Progress Notes (Addendum)
Subjective:  Pt is a 47 year old female with PMHx of ESRD on HD, Type II DM c/b gastroparesis, well controlled HIV, hx of gastric ulcers, hx of PVD s/p right BKA who presented to the clinic on 02/15/23 for her stump wound and was directly admitted for further management.  MRI this morning showed acute osteomyelitis involving the distal aspect of the residual fibula and soft tissue wound or ulceration at the distal stump laterally along with adjacent soft tissue edema.  She is currently receiving IV vancomycin and IV cefepime and will undergo a right AKA this afternoon.  No overnight events   Today, the patient reported having pain on her right BKA.  Stated that the Excedrin that she took yesterday did help with the migraine although she still having one today.  She reported feeling frightened and nervous for her upcoming AKA this afternoon.  Objective:  Vital signs in last 24 hours: Vitals:   02/18/23 2128 02/19/23 0445 02/19/23 0500 02/19/23 0755  BP: (!) 153/74 132/83  124/81  Pulse: 86   80  Resp: 16 16  16   Temp: 99.1 F (37.3 C) 98.5 F (36.9 C)  97.9 F (36.6 C)  TempSrc: Oral Oral  Oral  SpO2: 100% 100%  100%  Weight:   69.8 kg    Physical Exam: General: Anxious appearing, no acute distress Cardiac: Regular rate rhythm, no murmurs auscultated Pulmonary: Clear breath sounds bilaterally, normal work of breathing MSK: L AKA, R BKA with dressing on her wound     Latest Ref Rng & Units 02/19/2023    7:09 AM 02/18/2023    8:02 AM 02/17/2023    3:07 AM  CBC  WBC 4.0 - 10.5 K/uL 7.1  7.8  9.4   Hemoglobin 12.0 - 15.0 g/dL 16.1  9.1  9.5   Hematocrit 36.0 - 46.0 % 33.2  29.7  31.1   Platelets 150 - 400 K/uL 321  294  314        Latest Ref Rng & Units 02/19/2023    7:09 AM 02/18/2023    8:02 AM 02/17/2023    3:07 AM  CMP  Glucose 70 - 99 mg/dL 94  77  096   BUN 6 - 20 mg/dL 15  30  20    Creatinine 0.44 - 1.00 mg/dL 0.45  4.09  8.11   Sodium 135 - 145 mmol/L 132  134  132    Potassium 3.5 - 5.1 mmol/L 4.6  3.8  4.0   Chloride 98 - 111 mmol/L 94  98  98   CO2 22 - 32 mmol/L 21  21  21    Calcium 8.9 - 10.3 mg/dL 9.6  9.0  8.5     Assessment/Plan:  Principal Problem:   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)  #Right residual fibular acute osteomyelitis MRI of the right lower extremity BKA demonstrated acute osteomyelitis of the residual portion of the fibula along with tissue wound and soft tissue edema  -Right AKA this afternoon -Continue IV vancomycin and cefepime, will reach out with vascular surgery for changes to antibiotic regimen -Blood cultures: No growth to date -Will need PT/OT eval after surgery.   #Chronic diarrhea Unlikely to be infectious in origin due to lack of fever and normal white blood cell count. -Pancreatic elastase still pending -Stool osmolality still pending -Will recommend outpatient workup with either PCP or GI -Continue to avoid Imodium due to QT prolongation during initial EKG   #Migraine Excedrin  provided some relief to her migraine, will add a dose for today.  ESRD Nephrology consulted.  Continue dialysis as per their schedule.    Barriers to Discharge: Medical treatment Dispo: Anticipated discharge in approximately 1-2 day(s).   Faith Rogue, DO 02/19/2023, 2:39 PM Pager:[phone] After 5pm on weekdays and 1pm on weekends: On Call pager [phone]

## 2023-02-19 NOTE — Progress Notes (Signed)
Patient is back on unit resting in bed.

## 2023-02-19 NOTE — Anesthesia Preprocedure Evaluation (Addendum)
Anesthesia Evaluation  Patient identified by MRN, date of birth, ID band Patient confused    Reviewed: Allergy & Precautions, NPO status , Patient's Chart, lab work & pertinent test results  Airway Mallampati: III  TM Distance: >3 FB Neck ROM: Full    Dental  (+) Lower Dentures, Dental Advisory Given   Pulmonary Shortness of breath: 2L [facility] with dialysis., former smoker   Pulmonary exam normal        Cardiovascular hypertension, Pt. on medications + CAD and + Peripheral Vascular Disease  Normal cardiovascular exam   ECHO 2022: EF 60 to 65%. There is moderate left ventricular hypertrophy. Left atrial size was mildly dilated. The pericardial effusion is lateral to the left ventricle. Trivial mitral valve regurgitation. Aortic valve regurgitation is trivial. Moderate sclerosis with no stenosis.     Neuro/Psych  Headaches PSYCHIATRIC DISORDERS Anxiety Depression    CVA    GI/Hepatic Neg liver ROS, PUD,GERD  Medicated and Controlled,,  Endo/Other  diabetes, Type 2, Insulin Dependent    Renal/GU ESRF and DialysisRenal disease  negative genitourinary   Musculoskeletal negative musculoskeletal ROS (+)    Abdominal   Peds  Hematology  (+) Blood dyscrasia, anemia , HIVLab Results      Component                Value               Date                      WBC                      5.5                 12/25/2022                HGB                      11.2 (L)            12/31/2022                HCT                      33.0 (L)            12/31/2022                MCV                      92.3                12/25/2022                PLT                      238                 12/25/2022             Lab Results      Component                Value               Date                      NA  134 (L)             12/31/2022                K                        5.0                 12/31/2022                 CO2                      24                  12/25/2022                GLUCOSE                  143 (H)             12/31/2022                BUN                      15                  12/31/2022                CREATININE               4.30 (H)            12/31/2022                CALCIUM                  8.3 (L)             12/25/2022                GFRNONAA                 10 (L)              12/25/2022              Anesthesia Other Findings   Reproductive/Obstetrics                              Anesthesia Physical Anesthesia Plan  ASA: 3  Anesthesia Plan: General   Post-op Pain Management: Tylenol PO (pre-op)*   Induction: Intravenous  PONV Risk Score and Plan: 3 and Ondansetron, Dexamethasone, Midazolam and Treatment may vary due to age or medical condition  Airway Management Planned: LMA  Additional Equipment: None  Intra-op Plan:   Post-operative Plan: Extubation in OR  Informed Consent: I have reviewed the patients History and Physical, chart, labs and discussed the procedure including the risks, benefits and alternatives for the proposed anesthesia with the patient or authorized representative who has indicated his/her understanding and acceptance.     Dental advisory given and Consent reviewed with POA  Plan Discussed with: CRNA and Anesthesiologist  Anesthesia Plan Comments: (Consent reviewed with mother, Demetris Colon Branch, via telephone)        Anesthesia Quick Evaluation

## 2023-02-19 NOTE — Plan of Care (Signed)

## 2023-02-19 NOTE — Op Note (Signed)
    Patient name: Kathy Frank MRN: [id number] DOB: Dec 11, 1975 Sex: female  02/19/2023 Pre-operative Diagnosis: Osteomyelitis right below-knee amputation residual limb Post-operative diagnosis:  Same Surgeon:  Apolinar Junes C. Randie Heinz, MD Assistant: Clinton Gallant, PA Procedure Performed:  Conversion of right below-knee amputation to right above-knee amputation  Indications: 47 year old female with history of right below-knee amputation that was recently revised.  She now has osteomyelitis of the right fibula and is indicated for conversion to above-the-knee amputation.  Findings: All tissue appeared healthy and anterior and posterior flaps were reapproximated without tension.   Procedure:  The patient was identified in the holding area and taken to the operating room where she was placed upon operative table general anesthesia was induced.  She was sterilely prepped draped in the right upper extremity usual fashion, antibiotics were administered a timeout was called.  A fishmouth type incision was made above the knee we dissected down to the level of the femur and this was transected with bone saw.  The vessels were clamped proximally distally and transected and a posterior flap was created with cautery.  Vessels were then oversewn with figure-of-eight Vicryl stitch.  The nerve was pulled on tension tied off and divided.  Hemostasis was obtained in the wound.  The bone was smoothed with a rasp.  The posterior and anterior flaps were reapproximated with interrupted 2-0 Vicryl suture at the fascial level.  Staples were placed at the skin level.  Sterile dressing was applied.  Patient was awakened from anesthesia having tolerated procedure without any complication.  All counts were correct at completion.  EBL: 100 cc   Amario Longmore C. Randie Heinz, MD [facility] [place] Office: [phone] Pager: [phone]

## 2023-02-20 ENCOUNTER — Encounter (HOSPITAL_COMMUNITY): Payer: Self-pay | Admitting: Vascular Surgery

## 2023-02-20 DIAGNOSIS — T8733 Neuroma of amputation stump, right lower extremity: Secondary | ICD-10-CM

## 2023-02-20 DIAGNOSIS — F32A Depression, unspecified: Secondary | ICD-10-CM

## 2023-02-20 DIAGNOSIS — M86261 Subacute osteomyelitis, right tibia and fibula: Secondary | ICD-10-CM

## 2023-02-20 DIAGNOSIS — Z89611 Acquired absence of right leg above knee: Secondary | ICD-10-CM

## 2023-02-20 DIAGNOSIS — D631 Anemia in chronic kidney disease: Secondary | ICD-10-CM

## 2023-02-20 DIAGNOSIS — E119 Type 2 diabetes mellitus without complications: Secondary | ICD-10-CM

## 2023-02-20 DIAGNOSIS — L03115 Cellulitis of right lower limb: Secondary | ICD-10-CM | POA: Diagnosis not present

## 2023-02-20 DIAGNOSIS — E1169 Type 2 diabetes mellitus with other specified complication: Secondary | ICD-10-CM | POA: Diagnosis not present

## 2023-02-20 DIAGNOSIS — M899 Disorder of bone, unspecified: Secondary | ICD-10-CM

## 2023-02-20 DIAGNOSIS — Z794 Long term (current) use of insulin: Secondary | ICD-10-CM

## 2023-02-20 LAB — CBC
HCT: 28.7 % — ABNORMAL LOW (ref 36.0–46.0)
Hemoglobin: 8.8 g/dL — ABNORMAL LOW (ref 12.0–15.0)
MCH: 27 pg (ref 26.0–34.0)
MCHC: 30.7 g/dL (ref 30.0–36.0)
MCV: 88 fL (ref 80.0–100.0)
Platelets: 316 10*3/uL (ref 150–400)
RBC: 3.26 MIL/uL — ABNORMAL LOW (ref 3.87–5.11)
RDW: 16.7 % — ABNORMAL HIGH (ref 11.5–15.5)
WBC: 11.5 10*3/uL — ABNORMAL HIGH (ref 4.0–10.5)
nRBC: 0 % (ref 0.0–0.2)

## 2023-02-20 LAB — RENAL FUNCTION PANEL
Albumin: 3.1 g/dL — ABNORMAL LOW (ref 3.5–5.0)
Anion gap: 19 — ABNORMAL HIGH (ref 5–15)
BUN: 22 mg/dL — ABNORMAL HIGH (ref 6–20)
CO2: 19 mmol/L — ABNORMAL LOW (ref 22–32)
Calcium: 9.3 mg/dL (ref 8.9–10.3)
Chloride: 93 mmol/L — ABNORMAL LOW (ref 98–111)
Creatinine, Ser: 7.2 mg/dL — ABNORMAL HIGH (ref 0.44–1.00)
GFR, Estimated: 7 mL/min — ABNORMAL LOW (ref >60)
Glucose, Bld: 132 mg/dL — ABNORMAL HIGH (ref 70–99)
Phosphorus: 5.4 mg/dL — ABNORMAL HIGH (ref 2.5–4.6)
Potassium: 3.9 mmol/L (ref 3.5–5.1)
Sodium: 131 mmol/L — ABNORMAL LOW (ref 135–145)

## 2023-02-20 LAB — CULTURE, BLOOD (ROUTINE X 2)
Culture: NO GROWTH
Culture: NO GROWTH
Special Requests: ADEQUATE

## 2023-02-20 LAB — AEROBIC/ANAEROBIC CULTURE W GRAM STAIN (SURGICAL/DEEP WOUND)

## 2023-02-20 LAB — GLUCOSE, CAPILLARY: Glucose-Capillary: 147 mg/dL — ABNORMAL HIGH (ref 70–99)

## 2023-02-20 MED ORDER — HEPARIN SODIUM (PORCINE) 1000 UNIT/ML IJ SOLN: 3600.0000 [IU] | Freq: Once | INTRAMUSCULAR | Status: AC

## 2023-02-20 MED ORDER — HEPARIN SODIUM (PORCINE) 1000 UNIT/ML IJ SOLN
INTRAMUSCULAR | Status: AC
  Administered 2023-02-20: 3600 [IU]
  Filled 2023-02-20: qty 4

## 2023-02-20 NOTE — Progress Notes (Signed)
Pt arrived to unit from iHD at 1300 .... Pt is A&O to self and place and will follow commands intermittently.  Pt has occasional moaning and very restless when I ask her if she's in pain she will reply "yes" and I ask her where, she will reply with the last word she said "yes"... Pt is able to squeeze with both hands when asked to but will not leg go when asked to.  Notified MD Dr. Wendie Chess. No new orders given and will re assess in the evening. Consulting civil engineer notified.

## 2023-02-20 NOTE — Progress Notes (Addendum)
Subjective: Seen on hemodialysis, normal schedule today, having some postop pain did receive meds predialysis, tolerating UF   Objective Vital signs in last 24 hours: Vitals:   02/20/23 0019 02/20/23 0500 02/20/23 0615 02/20/23 0810  BP: (!) 152/61  (!) 167/124 (!) 131/57  Pulse: 88  93 93  Resp:    13  Temp: 98.1 F (36.7 C)  98.6 F (37 C) (!) 97.5 F (36.4 C)  TempSrc:      SpO2: 94%  100% 100%  Weight:  69.5 kg  64.9 kg   Weight change: -3.3 kg  Physical Exam: General: Alert chronically ill female NAD however appears anxious secondary to pain Heart: RRR, no MRG Lungs: CTA bilaterally nonlabored breathing Abdomen: NABS, NT ND Extremities: Right AKA wrapped clean dry, prior left AKA no anemia appreciated Dialysis Access: Right IJ PC patient on HD,L arm AVG positive.  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  Status post right AKA  Dr Randie Heinz 7/12,on IV abx vanc/cefepime (monitor for AME-on cefepime, does have history of anxiety/depression) Chronic diarrhea - for 3 mos. improving per patient, no abd pain. W/u per pmd. C diff negative.   ESRD - on HD TTS.  Scheduled today 02/20/2023. Use AVG with HD.  HTN - BP controlled. cont home bp meds per pmd.  Volume - HD 02/18/2023 Net 3.0 liters. ?  Bed WT accuracy, finally under OP EDW. Will need lower EDW post revision of BKA. UF as tolerated.  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  This a.m. 8.8 given does not appear given Tuesday 7/9/  darbe weekly  MBD ckd - CCa and phos in range. Cont po vdra and phoslo as binder.  HIV -Meds Per admit med team PAD - S/P bilateral amputations DM2 - per pmd   Lenny Pastel, PA-C [facility] Beeper [phone] 02/20/2023,8:18 AM  LOS: 5 days   Labs: Basic Metabolic Panel: Recent Labs  Lab 02/18/23 0802 02/19/23 0709 02/20/23 0255  NA 134* 132* 131*  K 3.8 4.6 3.9  CL 98 94* 93*  CO2 21* 21* 19*  GLUCOSE 77 94 132*  BUN 30* 15 22*  CREATININE 8.29* 5.42* 7.20*  CALCIUM 9.0 9.6 9.3  PHOS 4.5 3.6 5.4*   Liver Function Tests: Recent Labs  Lab 02/18/23 0802 02/19/23 0709 02/20/23 0255  ALBUMIN 2.5* 3.0* 3.1*   No results for input(s): "LIPASE", "AMYLASE" in the last 168 hours. No results for input(s): "AMMONIA" in the last 168 hours. CBC: Recent Labs  Lab 02/15/23 1715 02/16/23 1021 02/17/23 0307 02/18/23 0802 02/19/23 0709 02/20/23 0255  WBC 5.5 5.3 9.4 7.8 7.1 11.5*  NEUTROABS 2.9  --   --   --   --   --   HGB 8.5* 8.3* 9.5* 9.1* 10.5* 8.8*  HCT 28.1* 27.5* 31.1* 29.7* 33.2* 28.7*  MCV 87.3 89.3 88.1 88.9 87.8 88.0  PLT 320 297 314 294 321 316   Cardiac Enzymes: No results for input(s): "CKTOTAL", "CKMB", "CKMBINDEX", "TROPONINI" in the last 168 hours. CBG: Recent Labs  Lab 02/19/23 0752 02/19/23 1249  02/19/23 1516 02/19/23 1633 02/19/23 2113  GLUCAP 99 139* 116* 125* 151*    Studies/Results: No results found. Medications:  ceFEPime (MAXIPIME) IV Stopped (02/18/23 1851)   vancomycin 150 mL/hr at 02/20/23 0102    acetaminophen  1,000 mg Oral Q8H   amLODipine  10 mg Oral Daily   bictegravir-emtricitabine-tenofovir AF  1 tablet Oral Q supper   calcitRIOL  1 mcg Oral Q T,Th,Sat-1800   calcium acetate  1,334 mg Oral TID WC   Chlorhexidine Gluconate Cloth  6 each Topical Q0600   darbepoetin (ARANESP) injection - DIALYSIS  150 mcg Subcutaneous Q Tue-1800   feeding supplement (NEPRO CARB STEADY)  237 mL Oral BID BM   heparin  5,000 Units Subcutaneous Q8H   insulin aspart  0-5 Units Subcutaneous QHS   insulin aspart  0-9 Units Subcutaneous TID WC   insulin glargine-yfgn  7 Units Subcutaneous Daily   irbesartan  300 mg Oral Daily    pantoprazole  40 mg Oral BID   psyllium  1 packet Oral Daily

## 2023-02-20 NOTE — Progress Notes (Signed)
Kathy Frank was evaluated at the bedside due to concerns for worsening mental status.  During HD this morning, she was noted to be intermittently conversive with the team, but was able to follow commands. When I evaluated her at the bedside she is alert and oriented to self, place, and situation. She was seen feeding herself in bed and did require frequent redirecting and prompting with my questions, but she ultimately answered them in 1-2 word phrases. She followed commands and did not appear to have any change in neurologic status as compared to this morning.   Of note, the patient is a bilateral amputee and while sitting upright in bed she leans towards the bed rail, and is a fall risk. I have placed an order for a sitter (do not think a tele-sitter would be appropriate), so she can have frequent redirecting and repositioning, to avoid a fall.

## 2023-02-20 NOTE — Plan of Care (Signed)
  Problem: Education: Goal: Knowledge of General Education information will improve Description: Including pain rating scale, medication(s)/side effects and non-pharmacologic comfort measures Outcome: Progressing   Problem: Health Behavior/Discharge Planning: Goal: Ability to manage health-related needs will improve Outcome: Progressing   Problem: Clinical Measurements: Goal: Ability to maintain clinical measurements within normal limits will improve Outcome: Progressing Goal: Will remain free from infection Outcome: Progressing Goal: Diagnostic test results will improve Outcome: Progressing Goal: Respiratory complications will improve Outcome: Progressing Goal: Cardiovascular complication will be avoided Outcome: Progressing   Problem: Clinical Measurements: Goal: Will remain free from infection Outcome: Progressing   Problem: Clinical Measurements: Goal: Will remain free from infection Outcome: Progressing   Problem: Activity: Goal: Risk for activity intolerance will decrease Outcome: Progressing   Problem: Nutrition: Goal: Adequate nutrition will be maintained Outcome: Progressing   Problem: Elimination: Goal: Will not experience complications related to bowel motility Outcome: Progressing Goal: Will not experience complications related to urinary retention Outcome: Progressing   Problem: Pain Managment: Goal: General experience of comfort will improve Outcome: Progressing   Problem: Safety: Goal: Ability to remain free from injury will improve Outcome: Progressing   Problem: Skin Integrity: Goal: Risk for impaired skin integrity will decrease Outcome: Progressing   Problem: Education: Goal: Ability to describe self-care measures that may prevent or decrease complications (Diabetes Survival Skills Education) will improve Outcome: Progressing Goal: Individualized Educational Video(s) Outcome: Progressing   Problem: Coping: Goal: Ability to adjust to  condition or change in health will improve Outcome: Progressing   Problem: Tissue Perfusion: Goal: Adequacy of tissue perfusion will improve Outcome: Progressing   Problem: Skin Integrity: Goal: Risk for impaired skin integrity will decrease Outcome: Progressing

## 2023-02-20 NOTE — Plan of Care (Signed)

## 2023-02-20 NOTE — Progress Notes (Signed)
Pt moved to 6N31 from 6N11 for safety reasons so would be near the nurses station. Pt has order for safety sitter if needed. Floor mats on each side of the bed.

## 2023-02-20 NOTE — Progress Notes (Signed)
Hospital day#5 Subjective:   Summary: Kathy Frank is a 47 year old person living with a history of ESRD on hemodialysis, type 2 diabetes mellitus complicated by gastroparesis and prior amputations, severe PVD, well-controlled HIV, admitted for right lower extremity residual limb cellulitis and osteomyelitis positive for Pseudomonas now status post post op day 1 of right AKA with clean margins   Overnight Events: None  Patient was examined at hemodialysis.  Was sitting up.  Denied any pain, nausea, vomiting with limited yes and no non-verbal cues.  Witnessed patient talking to dialysis staff but declined speaking to IM team  Objective:  Vital signs in last 24 hours: Vitals:   02/20/23 1200 02/20/23 1205 02/20/23 1215 02/20/23 1221  BP: (!) 73/40 96/60 (!) 78/42 123/66  Pulse: 93 97 94 94  Resp: 17 (!) 21 17 16   Temp:    98.6 F (37 C)  TempSrc:      SpO2: 100% 100%  100%  Weight:    62.7 kg   Supplemental O2: Room Air SpO2: 100 % Filed Weights   02/20/23 0500 02/20/23 0810 02/20/23 1221  Weight: 69.5 kg 64.9 kg 62.7 kg    Physical Exam:  Constitutional:well-appearing woman sitting in HD bed, in no acute distress HENT: normocephalic atraumatic, moist mucous membranes Cardiovascular: regular rate and rhythm, no m/r/g Pulmonary/Chest: normal work of breathing on room air, lungs clear to auscultation bilaterally. No wheezing Abdominal: present BS, soft, non-tender, non-distended.  MSK: Right residual above-the-knee amputation scar without tenderness, redness, fluctuance, now unconvered. Neurological: alert; patient refused to participate in conversation but was able to follow commands for physical exam Skin: warm and dry Psych: flat mood and affect    Intake/Output Summary (Last 24 hours) at 02/20/2023 1339 Last data filed at 02/20/2023 1221 Gross per 24 hour  Intake 200 ml  Output 2400 ml  Net -2200 ml   Net IO Since Admission: -8,312.89 mL [02/20/23  1339]  Pertinent Labs:    Latest Ref Rng & Units 02/20/2023    2:55 AM 02/19/2023    7:09 AM 02/18/2023    8:02 AM  CBC  WBC 4.0 - 10.5 K/uL 11.5  7.1  7.8   Hemoglobin 12.0 - 15.0 g/dL 8.8  98.1  9.1   Hematocrit 36.0 - 46.0 % 28.7  33.2  29.7   Platelets 150 - 400 K/uL 316  321  294        Latest Ref Rng & Units 02/20/2023    2:55 AM 02/19/2023    7:09 AM 02/18/2023    8:02 AM  CMP  Glucose 70 - 99 mg/dL 191  94  77   BUN 6 - 20 mg/dL 22  15  30    Creatinine 0.44 - 1.00 mg/dL 4.78  2.95  6.21   Sodium 135 - 145 mmol/L 131  132  134   Potassium 3.5 - 5.1 mmol/L 3.9  4.6  3.8   Chloride 98 - 111 mmol/L 93  94  98   CO2 22 - 32 mmol/L 19  21  21    Calcium 8.9 - 10.3 mg/dL 9.3  9.6  9.0     Imaging: No results found.  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 Henry Russel Clear margins.  Patient with good pain control.  Discussed with Dr. Henry Russel after surgery; patient is to be resumed with heparin treatment for DVT prophylaxis.  Given clean margins today can be last day of IV antibiotic coverage.  Blood cultures with no growth to date since admission -Pain control with Dilaudid PRN and scheduled acetaminophen -DC antibiotics today after dialysis7/13 -Will follow-up with vascular surgery for wound care in the outpatient setting -Suspected day of discharge will be 7/14  ESRD on hemodialysis TTS Anemia of chronic disease secondary to ESRD Metabolic bone disease secondary to CKD At hemodialysis today via AVG. -Continue to monitor electrolytes and volume status -Continue ESA at HD -Continue calcium and phosphate binders  Depression Patient was nonverbal this morning suspect in setting of acute  changes/recent surgery.  She has been intermittently apathetic to communication with medical team.  Will continue to monitor for now but suspect that this patient would benefit from antidepressant and outpatient referral to integrated behavioral services as she continues to adjust to acute changes to chronic conditions.   -Continue to monitor   Hypertension -Controlled with home meds and hemodialysis for volume control  Diet: Renal VTE: Heparin Code: Full  Dispo: Anticipated discharge to Home in 1 days pending clinical stability  Morene Crocker, MD Internal Medicine Resident PGY-2 Please contact the on call pager after 5 pm and on weekends at [phone].

## 2023-02-20 NOTE — Progress Notes (Signed)
   02/20/23 1221  Vitals  Temp 98.6 F (37 C)  BP 123/66  MAP (mmHg) 81  Pulse Rate 94  ECG Heart Rate 92  Resp 16  Oxygen Therapy  SpO2 100 %  O2 Device Room Air  Patient Activity (if Appropriate) In bed  Pulse Oximetry Type Continuous  Post Treatment  Dialyzer Clearance Lightly streaked  Duration of HD Treatment -hour(s) 3.5 hour(s)  Hemodialysis Intake (mL) 0 mL  Liters Processed 75.7  Fluid Removed (mL) 2200 mL  Tolerated HD Treatment Yes  Post-Hemodialysis Comments pt noted to be restless throughout tx, often positioning herself lying completely in prone position, occluding lines, also lying across side rails, requiring almost constant redirection/repositioning  by staff. Moaning intermittently, confirming pain to right stump area, surgical site. Mostly nodding in response to questions, however will speak occasionally. UF paused several times due to low BP readings with unable to confirm accuracy due to pt's constant motion. Lenny Pastel, PA on unit and made aware.  Pt will not leave bandage on right stump, no active bleeding noted throughout and at end of tx.

## 2023-02-20 NOTE — Progress Notes (Signed)
Pt off the unit for iHD

## 2023-02-21 LAB — RENAL FUNCTION PANEL
Albumin: 3 g/dL — ABNORMAL LOW (ref 3.5–5.0)
Anion gap: 24 — ABNORMAL HIGH (ref 5–15)
BUN: 16 mg/dL (ref 6–20)
CO2: 18 mmol/L — ABNORMAL LOW (ref 22–32)
Calcium: 9.5 mg/dL (ref 8.9–10.3)
Chloride: 90 mmol/L — ABNORMAL LOW (ref 98–111)
Creatinine, Ser: 4.9 mg/dL — ABNORMAL HIGH (ref 0.44–1.00)
GFR, Estimated: 10 mL/min — ABNORMAL LOW (ref >60)
Glucose, Bld: 138 mg/dL — ABNORMAL HIGH (ref 70–99)
Phosphorus: 4.7 mg/dL — ABNORMAL HIGH (ref 2.5–4.6)
Potassium: 4 mmol/L (ref 3.5–5.1)
Sodium: 132 mmol/L — ABNORMAL LOW (ref 135–145)

## 2023-02-21 LAB — GLUCOSE, CAPILLARY
Glucose-Capillary: 138 mg/dL — ABNORMAL HIGH (ref 70–99)
Glucose-Capillary: 372 mg/dL — ABNORMAL HIGH (ref 70–99)
Glucose-Capillary: 272 mg/dL — ABNORMAL HIGH (ref 70–99)
Glucose-Capillary: 144 mg/dL — ABNORMAL HIGH (ref 70–99)
Glucose-Capillary: 240 mg/dL — ABNORMAL HIGH (ref 70–99)

## 2023-02-21 LAB — CBC
HCT: 27.2 % — ABNORMAL LOW (ref 36.0–46.0)
Hemoglobin: 8.5 g/dL — ABNORMAL LOW (ref 12.0–15.0)
MCH: 28.1 pg (ref 26.0–34.0)
MCHC: 31.3 g/dL (ref 30.0–36.0)
MCV: 89.8 fL (ref 80.0–100.0)
Platelets: 258 10*3/uL (ref 150–400)
RBC: 3.03 MIL/uL — ABNORMAL LOW (ref 3.87–5.11)
RDW: 17.1 % — ABNORMAL HIGH (ref 11.5–15.5)
WBC: 9.3 10*3/uL (ref 4.0–10.5)
nRBC: 0.4 % — ABNORMAL HIGH (ref 0.0–0.2)

## 2023-02-21 MED ORDER — BOOST / RESOURCE BREEZE PO LIQD CUSTOM
1.0000 | Freq: Three times a day (TID) | ORAL | Status: DC
  Administered 2023-02-21 – 2023-02-22 (×3): 1 via ORAL

## 2023-02-21 NOTE — Progress Notes (Signed)
Safety sitter order d/c'd due to patient is much more alert and at baseline than yesterday.

## 2023-02-21 NOTE — Progress Notes (Addendum)
Subjective:  Pt is a 47 year old female with PMHx of ESRD on HD, Type II DM c/b gastroparesis, well controlled HIV, hx of gastric ulcers, hx of PVD s/p right BKA who was admitted for right stump osteomyelitis and is currently post operative day 2 from right AKA.  Overnight events:Patient was evaluated for worsening mental status. Due to safety concerns, a safety sitter order was placed.  Today, she reported feeling better with her post operative pain ranked as a 5/10. She stated that she does not remember much from yesterday but is glad she is back to her normal self this morning. Patient is ok with being discharged home soon but does need additional supplies such as a board and a shower chair. She lives at home with her daughter.    Objective:  Vital signs in last 24 hours: Vitals:   02/20/23 1536 02/20/23 2018 02/21/23 0444 02/21/23 0500  BP: 128/60 (!) 103/51 (!) 121/96   Pulse: 98 100 (!) 101   Resp: 13 14 18    Temp: 98.1 F (36.7 C) 98.7 F (37.1 C) 98.4 F (36.9 C)   TempSrc:  Oral Oral   SpO2: 100% 100% 100%   Weight:    62.4 kg   PE: General:Comfortably sitting in bed, NAD Cardiac:RRR, no murmurs auscultated Pulmonary:Clear breath sounds auscultated bilaterally  MSK:B/L AKA, right AKA incision has staples, no draining or erythema present   Neuro:alert and oriented Psych: pleasant affect, normal mood     Latest Ref Rng & Units 02/21/2023    1:13 AM 02/20/2023    2:55 AM 02/19/2023    7:09 AM  CBC  WBC 4.0 - 10.5 K/uL 9.3  11.5  7.1   Hemoglobin 12.0 - 15.0 g/dL 8.5  8.8  30.8   Hematocrit 36.0 - 46.0 % 27.2  28.7  33.2   Platelets 150 - 400 K/uL 258  316  321        Latest Ref Rng & Units 02/21/2023    1:13 AM 02/20/2023    2:55 AM 02/19/2023    7:09 AM  CMP  Glucose 70 - 99 mg/dL 657  846  94   BUN 6 - 20 mg/dL 16  22  15    Creatinine 0.44 - 1.00 mg/dL 9.62  9.52  8.41   Sodium 135 - 145 mmol/L 132  131  132   Potassium 3.5 - 5.1 mmol/L 4.0  3.9  4.6    Chloride 98 - 111 mmol/L 90  93  94   CO2 22 - 32 mmol/L 18  19  21    Calcium 8.9 - 10.3 mg/dL 9.5  9.3  9.6     Assessment/Plan:  Principal Problem:   Osteomyelitis (HCC) Active Problems:   Essential hypertension   ESRD on dialysis (HCC)   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)   S/P AKA (above knee amputation), right (HCC)   Diabetes mellitus (HCC)  Osteomyelitis right residual fibula Post operative day 2 from conversion of R BKA to AKA, clean margins obtained -IV abx discontinued today -Pain control regimen: dilaudid 1mg  q4prn, tylenol 1000mg  q8 -Vascular f/u out patient: continue 4x4 with Tegaderm  -PT/OT consult placed for evaluation today: may go home vs CIR tomorrow depending on their recommendation  ESRD -Continue dialysis per nephrology with AVG -Continue Phoslo, aransep injection   Depression/changes in mentation: Resolved Patient's changes in mentation has improved from yesterday. She is A&Ox4 currently. Changes in mentation could have been due to  depression with AKA or due to anesthesia/pain medications clearance rate differing in ESRD.  -Continue to monitor for changes -Keep blinds open/lights on throughout the day -Limit interruptions in sleep throughout the night     Barriers to Discharge:Medical Treatment Dispo: Anticipated discharge in approximately 1-2 day(s).   Faith Rogue, DO 02/21/2023, 5:45 AM Pager: [phone] After 5pm on weekdays and 1pm on weekends: On Call pager [phone]
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
## 2023-02-21 NOTE — Progress Notes (Signed)
Postop day 2 right AKA  In much better spirits today, smiling, optimistic Patient to cover dressing yesterday evening, mild pain.  Regular rate, normal rhythm, nonlabored breathing Incision clean, dry Stump soft   Postop day 2 right AKA Would appreciate PT OT, CIR evaluation Overall I am happy with her progress.  She would benefit from a dressing on the wound Will place nursing orders for folded 4 x 4 with Tegaderm along the incision line to keep the incision off of the bed as this can create a nidus for infection.  Appreciate hospital medicine's care.  Victorino Sparrow MD

## 2023-02-21 NOTE — Evaluation (Signed)
Occupational Therapy Evaluation Patient Details Name: Kathy Frank MRN: [id number] DOB: [birth year] Today's Date: 02/21/2023   History of Present Illness Patient is a 47 yo presenting to the hospital for R LE cellulitis s/p R AKA. PMHx: b/l amputee, ESRD, DM, HIV, PVD   Clinical Impression   Patient reports living with her daughter and being Independent  with ADLs and able to complete light meal prep while her children take care of other IADLs.  Patient will be staying with her niece when she D/Cs home.  Patient reports that prior to admission she was complete stand pivot transfers and using w/c Independently.  Patient currently requires min A x2 for anterior/posterior transfer  and min/mod A for clothing mgmt.  Patient would benefit from additional OT intervention while here in the hospital to address functional safety, functional transfers and independence in ADLs.     Recommendations for follow up therapy are one component of a multi-disciplinary discharge planning process, led by the attending physician.  Recommendations may be updated based on patient status, additional functional criteria and insurance authorization.   Assistance Recommended at Discharge Intermittent Supervision/Assistance  Patient can return home with the following A little help with walking and/or transfers;Assistance with cooking/housework;A little help with bathing/dressing/bathroom;Assist for transportation    Functional Status Assessment  Patient has had a recent decline in their functional status and demonstrates the ability to make significant improvements in function in a reasonable and predictable amount of time.  Equipment Recommendations  Tub/shower bench (patient reports she is in the process of getting  motorized w/c)    Recommendations for Other Services       Precautions / Restrictions Precautions Precautions: Fall Restrictions Weight Bearing Restrictions: No      Mobility Bed Mobility Overal  bed mobility: Modified Independent                  Transfers Overall transfer level: Modified independent Equipment used: None               General transfer comment: transfer pad placed underneath patient's buttocks and min A x2 for patient to scoot posteriorly from bed into w/c  Verbal cues to lift buttocks to clear threshold from bed to w/c by using UEs to lift self up using w/c arm rests      Balance                                           ADL either performed or assessed with clinical judgement   ADL Overall ADL's : Needs assistance/impaired Eating/Feeding: Independent   Grooming: Wash/dry face;Wash/dry hands;Applying deodorant;Sitting;Supervision/safety (while seated in w/c)   Upper Body Bathing: Set up   Lower Body Bathing: Minimal assistance   Upper Body Dressing : Minimal assistance   Lower Body Dressing: Minimal assistance   Toilet Transfer: Min guard;+2 for physical assistance;Anterior/posterior (simulated with transfer to w/c)   Toileting- Clothing Manipulation and Hygiene: Moderate assistance               Vision Patient Visual Report: No change from baseline       Perception     Praxis      Pertinent Vitals/Pain Pain Assessment Pain Assessment: 0-10 Pain Score: 6  Pain Location: R leg Pain Descriptors / Indicators: Aching Pain Intervention(s): Monitored during session     Hand Dominance Right   Extremity/Trunk Assessment Upper Extremity Assessment  Upper Extremity Assessment: Overall WFL for tasks assessed   Lower Extremity Assessment Lower Extremity Assessment: Defer to PT evaluation   Cervical / Trunk Assessment Cervical / Trunk Assessment: Normal   Communication Communication Communication: No difficulties   Cognition Arousal/Alertness: Awake/alert Behavior During Therapy: WFL for tasks assessed/performed Overall Cognitive Status: Within Functional Limits for tasks assessed                                        General Comments       Exercises     Shoulder Instructions      Home Living Family/patient expects to be discharged to:: Private residence Living Arrangements: Other relatives (patient will being staying with niece) Available Help at Discharge: Family Type of Home: House Home Access: Level entry;Stairs to enter Entrance Stairs-Number of Steps: 1   Home Layout: One level     Bathroom Shower/Tub: Theme park manager: No   Home Equipment: Wheelchair - manual;BSC/3in1 (in process of getting power w/c)   Additional Comments: needs shower seat      Prior Functioning/Environment Prior Level of Function : Independent/Modified Independent (daughter does laundry, order meals out)               ADLs Comments:  (patient reports that her children complete laundry and she is able to complete light meal prep of frozen dinners)        OT Problem List: Decreased safety awareness;Decreased activity tolerance;Decreased strength      OT Treatment/Interventions: Self-care/ADL training;Therapeutic exercise;Neuromuscular education;Therapeutic activities    OT Goals(Current goals can be found in the care plan section) Acute Rehab OT Goals OT Goal Formulation: With patient Time For Goal Achievement: 03/07/23 Potential to Achieve Goals: Good  OT Frequency: Min 1X/week    Co-evaluation PT/OT/SLP Co-Evaluation/Treatment: Yes Reason for Co-Treatment: Complexity of the patient's impairments (multi-system involvement) PT goals addressed during session: Mobility/safety with mobility OT goals addressed during session: ADL's and self-care      AM-PAC OT "6 Clicks" Daily Activity     Outcome Measure Help from another person eating meals?: None Help from another person taking care of personal grooming?: None Help from another person toileting, which includes using toliet, bedpan, or urinal?: A  Little Help from another person bathing (including washing, rinsing, drying)?: A Little Help from another person to put on and taking off regular upper body clothing?: None Help from another person to put on and taking off regular lower body clothing?: A Lot 6 Click Score: 20   End of Session Nurse Communication: Mobility status  Activity Tolerance: Patient tolerated treatment well Patient left:  (in w/c)  OT Visit Diagnosis: Muscle weakness (generalized) (M62.81)                Time: 2952-8413 OT Time Calculation (min): 31 min Charges:  OT General Charges $OT Visit: 1 Visit OT Evaluation $OT Eval Moderate Complexity: 1 Mod OT Treatments $Self Care/Home Management : 8-22 mins Governor Specking OT/L Denice Paradise 02/21/2023, 2:09 PM

## 2023-02-21 NOTE — Plan of Care (Signed)
  Problem: Clinical Measurements: Goal: Ability to maintain clinical measurements within normal limits will improve Outcome: Progressing   Problem: Clinical Measurements: Goal: Will remain free from infection Outcome: Progressing   Problem: Clinical Measurements: Goal: Diagnostic test results will improve Outcome: Progressing   Problem: Clinical Measurements: Goal: Respiratory complications will improve Outcome: Progressing   Problem: Activity: Goal: Risk for activity intolerance will decrease Outcome: Progressing   

## 2023-02-21 NOTE — Progress Notes (Signed)
Subjective: Seen in room feeding herself breakfast, vastly more alert /cognitive and interactive than yesterday on dialysis.  Think she tolerated  hd yesterday /minimal postop pain now  Objective Vital signs in last 24 hours: Vitals:   02/20/23 1536 02/20/23 2018 02/21/23 0444 02/21/23 0500  BP: 128/60 (!) 103/51 (!) 121/96   Pulse: 98 100 (!) 101   Resp: 13 14 18    Temp: 98.1 F (36.7 C) 98.7 F (37.1 C) 98.4 F (36.9 C)   TempSrc:  Oral Oral   SpO2: 100% 100% 100%   Weight:    62.4 kg   Weight change: -4.6 kg  Physical Exam: General: Alert chronically ill female NAD/O x 3 Heart: RRR, no MRG Lungs: CTA bilaterally nonlabored breathing Abdomen: NABS, NT ND Extremities: Right AKA surgical staples present and clean/dry incision , prior left AKA no edema noted Dialysis Access: Right IJ PC/ ,LUA AVG + bruit  OP HD: TTS [facility]  4h  400/1.5   71kg   2K/3Ca bath  RIJ TDC / revised AVF LUA Heparin none - last OP HD 7/06, post wt 71.7kg - started 7/06 w/ one needle in AVF and other line in the catheter - mircera 150 mcg IV q 2 wks, last was on 01/14/23 - rocaltrol 1.0 mcg po tiw     Problem/Plan: 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  SP R AKA  Dr Randie Heinz 7/12,on IV abx vanc/cefepime (monitor for AME-on cefepime, does have history of anxiety/depression) ESRD - on HD TTS.  Will schedule, next HD 02/23/23. Use AVG with HD.  When alert and appropriate, may be able to trial Tuesday 7/16 if still appropriate HTN - BP controlled. cont home bp meds per pmd.  Volume - HD 02/18/2023 Net 3.0 liters, 2.2 L on 7/13. ? ?Bed WT accuracy, appropriately under OP EDW.  With BKA /follow-up BP with HD UF may be able to taper down BP meds.  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  7/13 Hgb 8.8 last  given Tuesday 7/9/  darbe follow-up Hgb trend MBD ckd - CCa and phos in range. Cont po vdra and phoslo as binder.  HIV -Meds Per admit med team PAD - S/P bilateral amputations DM2 - per  pmd Chronic diarrhea - for 3 mos. improving per patient, no abd pain.  Tolerating breakfast asking for more w/u per pmd. C diff negative.    Lenny Pastel, PA-C [facility] Beeper [phone] 02/21/2023,8:35 AM  LOS: 6 days   Labs: Basic Metabolic Panel: Recent Labs  Lab 02/19/23 0709 02/20/23 0255 02/21/23 0113  NA 132* 131* 132*  K 4.6 3.9 4.0  CL 94* 93* 90*  CO2 21* 19* 18*  GLUCOSE 94 132* 138*  BUN 15 22* 16  CREATININE 5.42* 7.20* 4.90*  CALCIUM 9.6 9.3 9.5  PHOS 3.6 5.4* 4.7*   Liver Function Tests: Recent Labs  Lab 02/19/23 0709 02/20/23 0255 02/21/23 0113  ALBUMIN 3.0* 3.1* 3.0*   No results for input(s): "LIPASE", "AMYLASE" in the last 168 hours. No results for input(s): "AMMONIA" in the last 168 hours. CBC: Recent Labs  Lab 02/15/23 1715 02/16/23 1021 02/17/23 0307 02/18/23 0802 02/19/23 0709 02/20/23 0255 02/21/23 0113  WBC 5.5   < > 9.4 7.8 7.1 11.5* 9.3  NEUTROABS 2.9  --   --   --   --   --   --   HGB 8.5*   < > 9.5* 9.1* 10.5* 8.8* 8.5*  HCT 28.1*   < >  31.1* 29.7* 33.2* 28.7* 27.2*  MCV 87.3   < > 88.1 88.9 87.8 88.0 89.8  PLT 320   < > 314 294 321 316 258   < > = values in this interval not displayed.   Cardiac Enzymes: No results for input(s): "CKTOTAL", "CKMB", "CKMBINDEX", "TROPONINI" in the last 168 hours. CBG: Recent Labs  Lab 02/19/23 1516 02/19/23 1633 02/19/23 2113 02/20/23 1652 02/21/23 0447  GLUCAP 116* 125* 151* 147* 138*    Studies/Results: No results found. Medications:  ceFEPime (MAXIPIME) IV 2 g (02/20/23 2202)   vancomycin Stopped (02/20/23 1142)    acetaminophen  1,000 mg Oral Q8H   amLODipine  10 mg Oral Daily   bictegravir-emtricitabine-tenofovir AF  1 tablet Oral Q supper   calcitRIOL  1 mcg Oral Q T,Th,Sat-1800   calcium acetate  1,334 mg Oral TID WC   Chlorhexidine Gluconate Cloth  6 each Topical Q0600   darbepoetin (ARANESP) injection - DIALYSIS  150 mcg Subcutaneous Q Tue-1800    feeding supplement (NEPRO CARB STEADY)  237 mL Oral BID BM   heparin  5,000 Units Subcutaneous Q8H   insulin aspart  0-5 Units Subcutaneous QHS   insulin aspart  0-9 Units Subcutaneous TID WC   insulin glargine-yfgn  7 Units Subcutaneous Daily   irbesartan  300 mg Oral Daily   pantoprazole  40 mg Oral BID   psyllium  1 packet Oral Daily

## 2023-02-22 ENCOUNTER — Other Ambulatory Visit: Payer: Self-pay | Admitting: Student

## 2023-02-22 DIAGNOSIS — Z89611 Acquired absence of right leg above knee: Secondary | ICD-10-CM

## 2023-02-22 LAB — RENAL FUNCTION PANEL
Albumin: 2.6 g/dL — ABNORMAL LOW (ref 3.5–5.0)
Anion gap: 17 — ABNORMAL HIGH (ref 5–15)
BUN: 28 mg/dL — ABNORMAL HIGH (ref 6–20)
CO2: 21 mmol/L — ABNORMAL LOW (ref 22–32)
Calcium: 9.2 mg/dL (ref 8.9–10.3)
Chloride: 87 mmol/L — ABNORMAL LOW (ref 98–111)
Creatinine, Ser: 7.08 mg/dL — ABNORMAL HIGH (ref 0.44–1.00)
GFR, Estimated: 7 mL/min — ABNORMAL LOW (ref >60)
Glucose, Bld: 144 mg/dL — ABNORMAL HIGH (ref 70–99)
Phosphorus: 4.4 mg/dL (ref 2.5–4.6)
Potassium: 3.5 mmol/L (ref 3.5–5.1)
Sodium: 125 mmol/L — ABNORMAL LOW (ref 135–145)

## 2023-02-22 LAB — CBC
HCT: 26.3 % — ABNORMAL LOW (ref 36.0–46.0)
Hemoglobin: 8.2 g/dL — ABNORMAL LOW (ref 12.0–15.0)
MCH: 26.7 pg (ref 26.0–34.0)
MCHC: 31.2 g/dL (ref 30.0–36.0)
MCV: 85.7 fL (ref 80.0–100.0)
Platelets: 336 10*3/uL (ref 150–400)
RBC: 3.07 MIL/uL — ABNORMAL LOW (ref 3.87–5.11)
RDW: 17.7 % — ABNORMAL HIGH (ref 11.5–15.5)
WBC: 11.5 10*3/uL — ABNORMAL HIGH (ref 4.0–10.5)
nRBC: 0.2 % (ref 0.0–0.2)

## 2023-02-22 LAB — GLUCOSE, CAPILLARY
Glucose-Capillary: 202 mg/dL — ABNORMAL HIGH (ref 70–99)
Glucose-Capillary: 200 mg/dL — ABNORMAL HIGH (ref 70–99)

## 2023-02-22 MED ORDER — CHLORHEXIDINE GLUCONATE CLOTH 2 % EX PADS
6.0000 | MEDICATED_PAD | Freq: Every day | CUTANEOUS | Status: DC
  Administered 2023-02-22: 6 via TOPICAL

## 2023-02-22 MED ORDER — OXYCODONE-ACETAMINOPHEN 5-325 MG PO TABS
1.0000 | ORAL_TABLET | ORAL | 0 refills | Status: DC | PRN
Start: 2023-02-22 — End: 2023-02-25

## 2023-02-22 NOTE — Care Management Important Message (Signed)
Important Message  Patient Details  Name: REIZY DUNLOW MRN: [id number] Date of Birth: 1976-03-17   Medicare Important Message Given:  Yes     Sherilyn Banker 02/22/2023, 4:06 PM

## 2023-02-22 NOTE — Progress Notes (Signed)
[facility] Progress Note   Subjective:   Patient seen and examined at bedside.  Improved cognition today.  Remains confused but recognizes me by name from outpatient dialysis.  Oriented to name, day, place.  Admits to being frustrated.  States she feels confused, like she has lost time.  "I just want to figure everything out and stop feeling like this. " Denies CP, SOB, abdominal pain and n/v/d.  Admits to pain in L stump with movement.    Objective Vitals:   02/21/23 2040 02/22/23 0500 02/22/23 0517 02/22/23 0812  BP: (!) 117/41  (!) 117/41 107/62  Pulse: 89  87 87  Resp: 18  18 16   Temp: 98.4 F (36.9 C)  97.7 F (36.5 C) 97.9 F (36.6 C)  TempSrc: Oral  Oral Oral  SpO2: 96%  95% 95%  Weight:  67 kg     Physical Exam General: well appearing female in NAD Heart:RRR, no mrg Lungs:CTAB, nml WOB Abdomen:soft, NTND Extremities:no LE edema Dialysis Access: LU AVG +b/t   Filed Weights   02/20/23 1221 02/21/23 0500 02/22/23 0500  Weight: 62.7 kg 62.4 kg 67 kg   No intake or output data in the 24 hours ending 02/22/23 0816  Additional Objective Labs: Basic Metabolic Panel: Recent Labs  Lab 02/20/23 0255 02/21/23 0113 02/22/23 0419  NA 131* 132* 125*  K 3.9 4.0 3.5  CL 93* 90* 87*  CO2 19* 18* 21*  GLUCOSE 132* 138* 144*  BUN 22* 16 28*  CREATININE 7.20* 4.90* 7.08*  CALCIUM 9.3 9.5 9.2  PHOS 5.4* 4.7* 4.4   Liver Function Tests: Recent Labs  Lab 02/20/23 0255 02/21/23 0113 02/22/23 0419  ALBUMIN 3.1* 3.0* 2.6*    CBC: Recent Labs  Lab 02/15/23 1715 02/16/23 1021 02/18/23 0802 02/19/23 0709 02/20/23 0255 02/21/23 0113 02/22/23 0419  WBC 5.5   < > 7.8 7.1 11.5* 9.3 11.5*  NEUTROABS 2.9  --   --   --   --   --   --   HGB 8.5*   < > 9.1* 10.5* 8.8* 8.5* 8.2*  HCT 28.1*   < > 29.7* 33.2* 28.7* 27.2* 26.3*  MCV 87.3   < > 88.9 87.8 88.0 89.8 85.7  PLT 320   < > 294 321 316 258 336   < > = values in this interval not displayed.   Blood  Culture    Component Value Date/Time   SDES BLOOD RIGHT HAND 02/15/2023 1814   SPECREQUEST  02/15/2023 1814    BOTTLES DRAWN AEROBIC AND ANAEROBIC Blood Culture adequate volume   CULT  02/15/2023 1814    NO GROWTH 5 DAYS Performed at [facility], [address], [place], [place] [address]    REPTSTATUS 02/20/2023 FINAL 02/15/2023 1814    CBG: Recent Labs  Lab 02/21/23 0447 02/21/23 1020 02/21/23 1300 02/21/23 1710 02/21/23 2256  GLUCAP 138* 372* 272* 144* 240*    Medications:   acetaminophen  1,000 mg Oral Q8H   amLODipine  10 mg Oral Daily   bictegravir-emtricitabine-tenofovir AF  1 tablet Oral Q supper   calcitRIOL  1 mcg Oral Q T,Th,Sat-1800   calcium acetate  1,334 mg Oral TID WC   Chlorhexidine Gluconate Cloth  6 each Topical Q0600   darbepoetin (ARANESP) injection - DIALYSIS  150 mcg Subcutaneous Q Tue-1800   feeding supplement  1 Container Oral TID BM   heparin  5,000 Units Subcutaneous Q8H   insulin aspart  0-5 Units  Subcutaneous QHS   insulin aspart  0-9 Units Subcutaneous TID WC   insulin glargine-yfgn  7 Units Subcutaneous Daily   irbesartan  300 mg Oral Daily   pantoprazole  40 mg Oral BID   psyllium  1 packet Oral Daily    Dialysis Orders: TTS [facility]  4h  400/1.5   71kg   2K/3Ca bath  RIJ TDC / revised AVF LUA Heparin none - last OP HD 7/06, post wt 71.7kg - started 7/06 w/ one needle in AVF and other line in the catheter - mircera 150 mcg IV q 2 wks, last was on 01/14/23 - rocaltrol 1.0 mcg po tiw     Problem/Plan: 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  SP R AKA  Dr Randie Heinz 7/12. Completed IV abx.  ESRD - on HD TTS.  HD tomorrow per regular schedule.  HTN - BP controlled. cont home bp meds per pmd.  Volume - Unsure of accuracy of bed weights, 5kg difference from yesterday.  Does not appear volume overloaded.  Expect lower EDW post surgery.  Will request to have bed zero out while she is not in it to get most accurate weight.   Anemia esrd - Hgb 8.2. Continue Aranesp qwk. Check iron studies.  MBD ckd - CCa and phos in range. Cont po vdra and phoslo as binder.  HIV -Meds Per admit med team PAD - S/P bilateral amputations DM2 - per pmd Chronic diarrhea - for 3 mos. improving per patient, no abd pain.  Tolerating breakfast asking for more w/u per pmd. C diff negative  Virgina Norfolk, PA-C [facility] 02/22/2023,8:16 AM  LOS: 7 days

## 2023-02-22 NOTE — Plan of Care (Signed)

## 2023-02-22 NOTE — Progress Notes (Signed)
  Progress Note    02/22/2023 7:43 AM 3 Days Post-Op  Subjective:  no complaints   Vitals:   02/21/23 2040 02/22/23 0517  BP: (!) 117/41 (!) 117/41  Pulse: 89 87  Resp: 18 18  Temp: 98.4 F (36.9 C) 97.7 F (36.5 C)  SpO2: 96% 95%   Physical Exam Lungs:  non labored Incisions:  R AKA dressing c/d/i Neurologic: A&O  CBC    Component Value Date/Time   WBC 11.5 (H) 02/22/2023 0419   RBC 3.07 (L) 02/22/2023 0419   HGB 8.2 (L) 02/22/2023 0419   HGB 9.2 (L) 10/19/2022 1204   HCT 26.3 (L) 02/22/2023 0419   HCT 28.6 (L) 10/19/2022 1204   PLT 336 02/22/2023 0419   PLT 343 10/19/2022 1204   MCV 85.7 02/22/2023 0419   MCV 91 10/19/2022 1204   MCH 26.7 02/22/2023 0419   MCHC 31.2 02/22/2023 0419   RDW 17.7 (H) 02/22/2023 0419   RDW 16.0 (H) 10/19/2022 1204   LYMPHSABS 1.2 02/15/2023 1715   LYMPHSABS 1.6 12/23/2017 1558   MONOABS 0.5 02/15/2023 1715   EOSABS 0.8 (H) 02/15/2023 1715   EOSABS 0.2 12/23/2017 1558   BASOSABS 0.1 02/15/2023 1715   BASOSABS 0.1 12/23/2017 1558    BMET    Component Value Date/Time   NA 125 (L) 02/22/2023 0419   NA 138 12/23/2017 1558   K 3.5 02/22/2023 0419   CL 87 (L) 02/22/2023 0419   CO2 21 (L) 02/22/2023 0419   GLUCOSE 144 (H) 02/22/2023 0419   BUN 28 (H) 02/22/2023 0419   BUN 28 (H) 12/23/2017 1558   CREATININE 7.08 (H) 02/22/2023 0419   CREATININE 12.07 (H) 05/29/2020 1155   CALCIUM 9.2 02/22/2023 0419   CALCIUM 8.5 (L) 11/25/2018 0309   GFRNONAA 7 (L) 02/22/2023 0419   GFRNONAA 79 11/14/2014 1154   GFRAA 5 (L) 01/06/2020 1329   GFRAA >89 11/14/2014 1154    INR    Component Value Date/Time   INR 1.1 12/23/2022 1623    No intake or output data in the 24 hours ending 02/22/23 0743   Assessment/Plan:  47 y.o. female is s/p R AKA  3 Days Post-Op   R AKA is well appearing Therapy recommending home health Office will arrange follow up in 4-6 weeks for staple removal   Kathy Rutter, PA-C [facility] [phone] 02/22/2023 7:43 AM

## 2023-02-22 NOTE — TOC Initial Note (Addendum)
Transition of Care (TOC) - Initial/Assessment Note   Received secure chat from attending to arrange HHPT and DME.   NCM spoke to patient at bedside.   Address on face sheet is her mailing address. She lives with her niece at [address] , [place] , [place] [address]   Cell phone is [phone]   Patient has a wheel chair   PT recommending HHPT , sliding board and tub bench  Tresa Endo with [facility] accepted HHPT referral.    Patient states her niece will assist with wound care at home. NCM asked bedside nurse Evette to provide education on wound care to patient and family.   Ordered sliding board and tub bench with Jermaine with [employer]. Per [employer] does not cover tub bench , [employer] will discuss cost directly with patient.   NCM asked MD for orders for HHPT and face to face . NCM working on home health PT    [facility] accepted referral for home health   Patient plans to stay with her cousin who is a Charity fundraiser at discharge . Address is [address] [place], [place] , [place] [address] . Tresa Endo with [facility] updated   Patient requesting ambulance transport home. Confirmed address [address] [place] [address] with patient, daughter and cousin . Daughter at bedside and aware ambulance cannot transport DME. [facility] called , paperwork on chart  Patient Details  Name: Kathy Frank MRN: [id number] Date of Birth: Oct 13, 1975  Transition of Care [facility]) CM/SW Contact:    Kingsley Plan, RN Phone Number: 02/22/2023, 9:52 AM  Clinical Narrative:                   Expected Discharge Plan: Home w Home Health Services Barriers to Discharge: Continued Medical Work up   Patient Goals and CMS Choice Patient states their goals for this hospitalization and ongoing recovery are:: to return to home CMS Medicare.gov Compare Post Acute Care list provided to:: Patient Choice offered to / list presented to : Patient [place] ownership interest in [facility].provided to::  Patient    Expected Discharge Plan and Services In-house Referral: Clinical Social Work Discharge Planning Services: CM Consult Post Acute Care Choice: Home Health Living arrangements for the past 2 months: Single Family Home                 DME Arranged: Tub bench, Other see comment (sliding board) DME Agency: [employer] Date DME Agency Contacted: 02/22/23 Time DME Agency Contacted: [phone] Representative spoke with at DME Agency: Vaughan Basta HH Arranged: PT          Prior Living Arrangements/Services Living arrangements for the past 2 months: Single Family Home Lives with:: Relatives (niece) Patient language and need for interpreter reviewed:: Yes Do you feel safe going back to the place where you live?: Yes      Need for Family Participation in Patient Care: Yes (Comment) Care giver support system in place?: Yes (comment) Current home services: DME Criminal Activity/Legal Involvement Pertinent to Current Situation/Hospitalization: No - Comment as needed  Activities of Daily Living Home Assistive Devices/Equipment: Wheelchair, Bedside commode/3-in-1, CBG Meter ADL Screening (condition at time of admission) Patient's cognitive ability adequate to safely complete daily activities?: Yes Is the patient deaf or have difficulty hearing?: No Does the patient have difficulty seeing, even when wearing glasses/contacts?: No Does the patient have difficulty concentrating, remembering, or making decisions?: No Patient able to express need for assistance with ADLs?: Yes  Does the patient have difficulty dressing or bathing?: No Independently performs ADLs?: Yes (appropriate for developmental age) Does the patient have difficulty walking or climbing stairs?: Yes Weakness of Legs: None Weakness of Arms/Hands: None  Permission Sought/Granted   Permission granted to share information with : No              Emotional Assessment Appearance:: Appears stated  age Attitude/Demeanor/Rapport: Engaged Affect (typically observed): Accepting Orientation: : Oriented to Self, Oriented to Place, Oriented to  Time, Oriented to Situation Alcohol / Substance Use: Not Applicable Psych Involvement: No (comment)  Admission diagnosis:  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) [T87.43] Patient Active Problem List   Diagnosis Date Noted   S/P AKA (above knee amputation), right (HCC) 02/20/2023   Diabetes mellitus (HCC) 02/20/2023   S/P arteriovenous (AV) graft placement 02/16/2023   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) 02/15/2023   Colitis 02/04/2023   A-V fistula (HCC) 12/23/2022   Dependence on wheelchair 11/13/2022   Blister 10/29/2022   High anion gap metabolic acidosis 10/13/2022   Acute gastric ulcer with hemorrhage 10/13/2022   Right-sided chest pain 10/01/2022   Homelessness 08/26/2022   Migraine with aura 06/29/2022   Requires assistance with activities of daily living (ADL) 10/27/2021   Urinary and fecal incontinence 05/30/2021   Status post coronary artery stent placement    Coronary artery disease involving native coronary artery of native heart with unstable angina pectoris (HCC)    History of non-ST elevation myocardial infarction (NSTEMI) 05/20/2021   BKA stump complication (HCC) 04/18/2021   Right below-knee amputee (HCC) 02/08/2021   Peripheral arterial disease (HCC) 01/29/2021   Anxiety 05/28/2020   Patient's other noncompliance with medication regimen 12/28/2019   Major depressive disorder, single episode, unspecified 04/24/2019   Secondary hyperparathyroidism of renal origin (HCC) 04/24/2019   S/P AKA (above knee amputation), left (HCC) 12/16/2018   Anemia due to chronic kidney disease    Osteomyelitis (HCC) 10/07/2018   Moderate protein-calorie malnutrition (HCC)    Adjustment disorder with mixed anxiety and depressed mood 08/15/2018   ESRD on dialysis [facility])    Aortic atherosclerosis  (HCC) 09/30/2016   Human immunodeficiency virus (HIV) disease (HCC) 04/23/2016   Gastroparesis    Moderate nonproliferative diabetic retinopathy of both eyes (HCC) 11/21/2014   Esophageal reflux    Essential hypertension 11/16/2013   Anemia 11/15/2013   Depression 06/28/2006   Diabetes mellitus type 2 in obese 06/28/1994   PCP:  Jeral Pinch, DO Pharmacy:   [id number] - [place]), [place] - [address] [place]) [place] [address] Phone: [phone] Fax: [phone]  [place] [facility] [address] [place] [address] Phone: [phone] Fax: [phone]  [facility] [address] [place] [address] Phone: [phone] Fax: [phone]     Social Determinants of Health (SDOH) Social History: SDOH Screenings   Food Insecurity: No Food Insecurity (02/15/2023)  Housing: Low Risk  (02/15/2023)  Transportation Needs: No Transportation Needs (02/15/2023)  Recent Concern: Transportation Needs - Unmet Transportation Needs (11/30/2022)   [facility]  Utilities: Not At Risk (02/15/2023)  Alcohol Screen: Low Risk  (10/19/2022)  Depression (PHQ2-9): High Risk (11/03/2022)  Financial Resource Strain: Low Risk  (10/19/2022)  Physical Activity: Inactive (10/19/2022)  Social Connections: Socially Isolated (10/19/2022)  Stress: Stress Concern Present (10/19/2022)  Tobacco Use:  Medium Risk (02/19/2023)   SDOH Interventions:     Readmission Risk Interventions    12/24/2022   11:30 AM 05/23/2021    2:13 PM 04/22/2021    3:05 PM  Readmission Risk Prevention Plan  Transportation Screening Complete Complete Complete  Medication Review [employer]) Complete Complete Complete  PCP or Specialist appointment within 3-5 days of discharge Complete Complete Complete  HRI or Home Care  Consult Complete Complete Complete  SW Recovery Care/Counseling Consult Complete Complete Complete  Palliative Care Screening Not Applicable Not Applicable Not Applicable  Skilled Nursing Facility Not Applicable Not Applicable Not Applicable

## 2023-02-22 NOTE — Discharge Summary (Signed)
Name: Kathy Frank MRN: [id number] DOB: 1976/06/27 47 y.o. PCP: Jeral Pinch, DO  Date of Admission: 02/15/2023  2:52 PM Date of Discharge: 02/22/2023 2:16 PM Attending Physician: Dr. Antony Contras  Discharge Diagnosis: Principal Problem:   Osteomyelitis (HCC) Active Problems:   Essential hypertension   ESRD on dialysis [facility])   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)   S/P AKA (above knee amputation), right (HCC)   Diabetes mellitus (HCC)    Discharge Medications: Allergies as of 02/22/2023   No Known Allergies      Medication List     TAKE these medications    Accu-Chek Softclix Lancets lancets Use as instructed   acetaminophen 500 MG tablet Commonly known as: TYLENOL Take 2 tablets (1,000 mg total) by mouth every 6 (six) hours.   amLODipine-olmesartan 10-40 MG tablet Commonly known as: AZOR Take 1 tablet by mouth every evening.   atorvastatin 80 MG tablet Commonly known as: LIPITOR Take 1 tablet (80 mg total) by mouth daily.   Biktarvy 50-200-25 MG Tabs tablet Generic drug: bictegravir-emtricitabine-tenofovir AF Take 1 tablet by mouth daily. OVERDUE FOR OFFICE FOLLOW UP - PLEASE CALL OUR OFFICE TO SCHEDULE [phone] What changed: additional instructions   bisacodyl 10 MG suppository Commonly known as: DULCOLAX Place 1 suppository (10 mg total) rectally as needed for moderate constipation.   calcitRIOL 0.25 MCG capsule Commonly known as: ROCALTROL Take 5 capsules (1.25 mcg total) by mouth Every Tuesday,Thursday,and Saturday with dialysis.   calcium acetate 667 MG capsule Commonly known as: PHOSLO Take 2 capsules (1,334 mg total) by mouth 3 (three) times daily with meals.   cyclobenzaprine 5 MG tablet Commonly known as: FLEXERIL Take 5 mg by mouth 2 (two) times daily as needed for muscle spasms.   Darbepoetin Alfa 200 MCG/0.4ML Sosy injection Commonly known as: ARANESP Inject 0.4 mLs (200 mcg total) into the skin every Monday  at 6 PM. What changed:  when to take this additional instructions   Dexcom G7 Receiver Devi Use to check blood sugars cntinuously   Dexcom G7 Sensor Misc Use to check blood sugar continuously   diclofenac Sodium 1 % Gel Commonly known as: Voltaren Apply 4 g topically 4 (four) times daily. What changed:  when to take this reasons to take this   EQ Aspirin Adult Low Dose 81 MG tablet Generic drug: aspirin EC TAKE 1 TABLET BY MOUTH ONCE DAILY. SWALLOW WHOLE. What changed: See the new instructions.   escitalopram 5 MG tablet Commonly known as: Lexapro Take 1 tablet (5 mg total) by mouth daily.   gabapentin 100 MG capsule Commonly known as: NEURONTIN Take 100 mg by mouth at bedtime.   glucose blood test strip Use as instructed   hydrocortisone-pramoxine rectal foam Commonly known as: PROCTOFOAM-HC Place 1 applicator rectally 2 (two) times daily.   Lantus SoloStar 100 UNIT/ML Solostar Pen Generic drug: insulin glargine Inject 15 Units into the skin at bedtime.   lidocaine 5 % Commonly known as: LIDODERM Place 1 patch onto the skin daily. Remove & Discard patch within 12 hours or as directed by MD What changed:  when to take this reasons to take this additional instructions   liraglutide 18 MG/3ML Sopn Commonly known as: VICTOZA Inject 0.6 mg into the skin daily.   Lokelma 5 g packet Generic drug: sodium zirconium cyclosilicate Take 5 g by mouth daily.   multivitamin Tabs tablet Take 1 tablet by mouth daily. What changed:  when to take this additional instructions  ondansetron 8 MG tablet Commonly known as: Zofran Take 1 tablet (8 mg total) by mouth every 8 (eight) hours as needed for nausea or vomiting.   oxyCODONE-acetaminophen 5-325 MG tablet Commonly known as: Percocet Take 1 tablet by mouth every 4 (four) hours as needed for severe pain.   pantoprazole 40 MG tablet Commonly known as: Protonix Take 1 tablet (40 mg total) by mouth 2 (two) times  daily.   Pentips 32G X 4 MM Misc Generic drug: Insulin Pen Needle Use as directed   polyethylene glycol 17 g packet Commonly known as: MIRALAX / GLYCOLAX Take 17 g by mouth 2 (two) times daily.   ProAir HFA 108 (90 Base) MCG/ACT inhaler Generic drug: albuterol Inhale 2 puffs into the lungs every 4 (four) hours as needed for wheezing or shortness of breath.   Senexon-S 8.6-50 MG tablet Generic drug: senna-docusate Take 1 tablet by mouth at bedtime.   Ubrelvy 50 MG Tabs Generic drug: Ubrogepant Take 1 tablet (50 mg total) by mouth as needed (migraine headache). if needed a second dose may be taken at least 2 hours after the initial dose; MAX 8 doses in a month               Durable Medical Equipment  (From admission, onward)           Start     Ordered   02/22/23 1259  DME Shower stool  Once        02/22/23 1301   02/22/23 0852  For home use only DME Other see comment  Once       Comments: Sliding board  Question:  Length of Need  Answer:  Lifetime   02/22/23 0853   02/22/23 0851  For home use only DME Tub bench  Once        02/22/23 0853              Discharge Care Instructions  (From admission, onward)           Start     Ordered   02/22/23 0000  Discharge wound care:       Comments: Per vascular surgery, please place the dressing on your surgical site overnight, but you may leave it to  "air dry" throughout the day.   02/22/23 1301            Disposition and follow-up:   [name] was discharged from [facility] in Stable condition.  At the hospital follow up visit please address:  1.  Follow-up:  *Right BKA to AKA, Osteomyelitis: -Please ensure patient follows up with vascular surgery in 4-6 weeks for staple removal -Please check the incision for infection -Please check the incision for dehiscence -Educate patient on the importance of covering the incision along with wound hygiene  -Ensure patient has the  correct supplies at home for b/l AKA  -Check for fever  -Blood cultures are no growth at 5 days   *Chronic diarrhea: -Please encourage patient to avoid stool softeners/laxative use -Please note that she tested negative for C. Difficile along with a negative GI panel -Follow up on results from the fecal pancreatic elastase and stool osmolality -Consider GI referral   *Depression/changes in mentation: -Although resolved, please monitor patient for signs of depression -Please evaluate patient for suicidal or homicidal ideations   *ESRD -Please ensure patient follows up with nephrology and receives dialysis as scheduled  *HIV -Please note that patient's recent HIV RNA quantity is <20 -  Please note that patient's recent T-helper CD4 count is 427, CD4% Helper T cell is 35%     2.  Labs / imaging needed at time of follow-up: BMP, CBC  3.  Pending labs/ test needing follow-up: Pancreatic fecal elastase, stool osmolarity   4.  Medication Changes  STOPPED  -None     ADDED  -None     MODIFIED  -None     Follow-up Appointments:  Follow-up Information     [facility].   Specialty: Home Health Services Contact information: [address] [place] [address] [phone]                [facility]: July 23rd 2024 at [facility] Course by problem list: #Acute osteomyelitis of right residual fibula She presented to the clinic on 02/15/23 for her stump wound and was directly admitted for further management.  MRI showed acute osteomyelitis involving the distal aspect of the residual fibula and soft tissue wound or ulceration at the distal stump laterally along with adjacent soft tissue edema.  The wound culture back positive for Pseudomonas aeruginosa.  She received IV vancomycin and IV cefepime and underwent a right BKA to AKA conversion on 7/12 and completed antibiotic therapy on 7/14.  #Chronic  Diarrhea Patient reported 3 months of chronic watery diarrhea. GI panel and C. Difficile panel were negative. Her fecal elastase and stool osmolarity are still pending. Diarrhea could likely be due to inadvertently misusing laxatives due to her lack of insight on which medications she is currently using.  She was sent home on numerous stool softeners after her last hospitalization and is unsure if they are being consumed.  Please ensure that she is not using laxatives/stool softeners.  #Changes in mentation, possible depression--> resolved Patient was noted to have changes in baseline mentation on 7/13 when the team evaluated her. She was not participating in conversation and continued rocking back and forth with her body. She was not at baseline. On 7/14, the changes in mentation seemed to be resolved. Changes in mentation could have been due to anesthesia and pain medications in ESRD or depression due to recent AKA.       Discharge Subjective:  Today, the patient is experiencing pain around the surgical site of her right AKA.  She is also quite frustrated because she is mixing up her days of the week.  Other than the pain, she reported feeling okay and having a normal appetite.  She is okay with the discharge plan to go home today where she reported living with her cousin but having her daughter pick her up this afternoon.  Discharge Exam:   Blood pressure 107/62, pulse 87, temperature 97.9 F (36.6 C), temperature source Oral, resp. rate 16, weight 67 kg, SpO2 95%.  Constitutional:non ill-appearing comfortably sitting in bed, in no acute distress Cardiovascular: regular rate and rhythm, no m/r/g Pulmonary/Chest: normal work of breathing on room air, lungs clear to auscultation bilaterally. No crackles  Abdominal: soft, non-tender, non-distended. Neurological: alert  MSK: Left AKA, right AKA with dressing+ Tegaderm applied, trace serosanguinous fluid draining on the dressing Skin: warm and  dry Psych: Normal mood and affect  Pertinent Labs, Studies, and Procedures:     Latest Ref Rng & Units 02/22/2023    4:19 AM 02/21/2023    1:13 AM 02/20/2023    2:55 AM  CBC  WBC 4.0 - 10.5 K/uL 11.5  9.3  11.5   Hemoglobin 12.0 - 15.0 g/dL 8.2  8.5  8.8   Hematocrit 36.0 - 46.0 % 26.3  27.2  28.7   Platelets 150 - 400 K/uL 336  258  316        Latest Ref Rng & Units 02/22/2023    4:19 AM 02/21/2023    1:13 AM 02/20/2023    2:55 AM  CMP  Glucose 70 - 99 mg/dL 045  409  811   BUN 6 - 20 mg/dL 28  16  22    Creatinine 0.44 - 1.00 mg/dL 9.14  7.82  9.56   Sodium 135 - 145 mmol/L 125  132  131   Potassium 3.5 - 5.1 mmol/L 3.5  4.0  3.9   Chloride 98 - 111 mmol/L 87  90  93   CO2 22 - 32 mmol/L 21  18  19    Calcium 8.9 - 10.3 mg/dL 9.2  9.5  9.3     MR TIBIA FIBULA RIGHT W WO CONTRAST  Result Date: 02/16/2023 CLINICAL DATA:  Soft tissue infection suspected; stump infection EXAM: MRI OF LOWER RIGHT EXTREMITY WITHOUT AND WITH CONTRAST TECHNIQUE: Multiplanar, multisequence MR imaging of the right lower extremity was performed both before and after administration of intravenous contrast. CONTRAST:  7mL GADAVIST GADOBUTROL 1 MMOL/ML IV SOLN COMPARISON:  X-ray 04/17/2021 FINDINGS: Bones/Joint/Cartilage Postsurgical changes from below-knee amputation of the right lower extremity. Bone marrow edema and enhancement involving the resection margin of the residual fibula with confluent low T1 marrow signal change distally, most compatible with acute osteomyelitis. Subtle marrow edema along the distal resection margin of the tibia with preservation of the T1 marrow signal. This may reflect reactive osteitis or early acute osteomyelitis. Bone infarcts are noted within the proximal tibia. Knee joint intact with mild patellofemoral osteoarthritis. No significant knee joint effusion. No fracture or dislocation. Ligaments Intact knee ligaments. Muscles and Tendons Amputation changes of the proximal calf  musculature. Edema within the residual calf musculature may reflect denervation changes or myositis. Intact tendinous structures. Soft tissues Soft tissue wound or ulceration at the distal stump laterally. Adjacent soft tissue edema. No organized fluid collections. IMPRESSION: 1. Postsurgical changes from below-knee amputation of the right lower extremity. 2. Acute osteomyelitis involving the distal aspect of the residual fibula. 3. Subtle marrow edema along the distal resection margin of the tibia which may reflect reactive osteitis or early acute osteomyelitis. 4. Soft tissue wound or ulceration at the distal stump laterally. Adjacent soft tissue edema. No organized fluid collections. 5. Edema within the residual calf musculature may reflect denervation changes or myositis. Electronically Signed   By: Duanne Guess D.O.   On: 02/16/2023 10:07     Discharge Instructions: Discharge Instructions     Call MD for:  difficulty breathing, headache or visual disturbances   Complete by: As directed    Call MD for:  extreme fatigue   Complete by: As directed    Call MD for:  hives   Complete by: As directed    Call MD for:  persistant dizziness or light-headedness   Complete by: As directed    Call MD for:  persistant nausea and vomiting   Complete by: As directed    Call MD for:  redness, tenderness, or signs of infection (pain, swelling, redness, odor or green/yellow discharge around incision site)   Complete by: As directed    Call MD for:  severe uncontrolled pain   Complete by: As directed    Call  MD for:  temperature >100.4   Complete by: As directed    Diet - low sodium heart healthy   Complete by: As directed    Discharge instructions   Complete by: As directed    Patient instructions:  You came into the hospital with a wound on your right stump and were diagnosed with osteomyelitis (infection of the bone). You were treated with IV antibiotics and surgery to remove the infected bone.    *For your osteomyelitis (bone infection) -Please follow up with the vascular surgeons who did the surgery in 4 to 6 weeks -Please keep the wound dry/clean -Please keep the wound elevated  -Please use gauze and the large bandage (Tegaderm) on the wound to help prevent infection -You may leave the wound open during the day -PLEASE cover the wound at night  -If you notice pus or blood leaking from the wound, please call your doctor   *For your chronic diarrhea -Please avoid taking medicine that softens your stool such as miralax, senna, docusate -Please speak to your family doctor about further work up of your chronic diarrhea  Follow up:  Please see your PCP in the next 7-10 days or sooner Please see the vascular surgeon in the next 1-2 weeks Please continue to go to dialysis and follow up with nephrologist   Please continue taking medications as previously prescribed.  If you have any concerns or questions, please call us at: [facility] internal medicine clinic:[phone]  If you experience any of these symptoms, please go to the emergency department: -Chest pain -Shortness of breath -Fever -Chills -Worsening pain in your thigh -Syncope (passing out) -Pus draining from your surgery site -Blood draining from your surgery site    We are glad that you are feeling better! It was a pleasure to care for you in the hospital.   Faith Rogue DO   Discharge wound care:   Complete by: As directed    Per vascular surgery, please place the dressing on your surgical site overnight, but you may leave it to  "air dry" throughout the day.   Increase activity slowly   Complete by: As directed        Signed: Faith Rogue DO [facility] - PGY1 Pager: [phone] 02/22/2023, 2:16 PM    Please contact the on call pager after 5 pm and on weekends at [phone].

## 2023-02-22 NOTE — Progress Notes (Signed)
Wound care done as per wound care order. Patient taught how to do wound care.

## 2023-02-22 NOTE — Discharge Planning (Signed)
[facility] Patient Discharge Orders- [facility] CLINIC: [facility]  Patient's name: JASSMINE VANDRUFF Admit/DC Dates: 02/15/2023 - 02/22/23  Discharge Diagnoses: R BKA stump infection/osteomyelitis s/p R AKA     Aranesp: Given: yes   Date and amount of last dose: on 02/16/23 Last Hgb: 8.2 PRBC's Given: no  ESA dose for discharge: mircera 150 mcg IV q 2 weeks next 7/16 IV Iron dose at discharge: per protocol  Heparin change: no  EDW Change: yes New EDW: 64kg  Call PA on call if seems way off when patient comes for treatment  Bath Change: no  Access intervention/Change: no Details:  Hectorol/Calcitriol change: no  Discharge Labs: Calcium 9.2 Phosphorus 4.4 Albumin 2.6 K+ 3.5  Start ONSP if not already getting  IV Antibiotics: no Details:  On Coumadin?: no Last INR: Next INR: Managed By:   OTHER/APPTS/LAB ORDERS:    D/C Meds to be reconciled by nurse after every discharge.  Completed By:   Reviewed by: MD:______ RN_______

## 2023-02-22 NOTE — Anesthesia Postprocedure Evaluation (Signed)
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  Last Vitals:  Vitals:   02/22/23 0517 02/22/23 0812  BP: (!) 117/41 107/62  Pulse: 87 87  Resp: 18 16  Temp: 36.5 C 36.6 C  SpO2: 95% 95%    Last Pain:  Vitals:   02/22/23 0812  TempSrc: Oral  PainSc:                  Beryle Lathe

## 2023-02-22 NOTE — Progress Notes (Signed)
Attending staff confirm plans for pt to d/c today. Contacted [facility]) to advise clinic of pt's d/c today and that pt should resume care tomorrow.   Olivia Canter Renal Navigator [phone]

## 2023-02-23 ENCOUNTER — Telehealth: Payer: Self-pay

## 2023-02-23 ENCOUNTER — Other Ambulatory Visit (HOSPITAL_COMMUNITY): Payer: Self-pay

## 2023-02-23 ENCOUNTER — Telehealth: Payer: Self-pay | Admitting: Nephrology

## 2023-02-23 ENCOUNTER — Other Ambulatory Visit: Payer: Self-pay

## 2023-02-23 DIAGNOSIS — B20 Human immunodeficiency virus [HIV] disease: Secondary | ICD-10-CM

## 2023-02-23 MED ORDER — BIKTARVY 50-200-25 MG PO TABS
1.0000 | ORAL_TABLET | Freq: Every day | ORAL | 5 refills | Status: DC
Start: 2023-02-23 — End: 2023-05-21

## 2023-02-23 NOTE — Telephone Encounter (Signed)
Transition of Care Contact from Inpatient Facility  Date of Discharge: 02/22/23 Date of Contact: 02/23/23 Method of contact: phone - attempted  Attempted to contact patient to discuss transition of care from inpatient admission.  Patient did not answer the phone.  Unable to leave a voicemail, will attempt to call them again and if unable to reach will follow up at dialysis.  Virgina Norfolk, PA-C [employer]

## 2023-02-23 NOTE — Transitions of Care (Post Inpatient/ED Visit) (Signed)
   02/23/2023  Name: Kathy Frank MRN: [id number] DOB: 03-Dec-1975  Today's TOC FU Call Status: Today's TOC FU Call Status:: Unsuccessul Call (1st Attempt) Unsuccessful Call (1st Attempt) Date: 02/23/23  Attempted to reach the patient regarding the most recent Inpatient/ED visit.  Follow Up Plan: Additional outreach attempts will be made to reach the patient to complete the Transitions of Care (Post Inpatient/ED visit) call.   [name], RN, BSN, CCM Care Management Coordinator [place] Phone: [phone]/Fax: [phone]

## 2023-02-23 NOTE — Telephone Encounter (Signed)
Pt called stating that she's having a "hard time wrapping her amputated leg".  Reviewed pt's chart, returned call for clarification, two identifiers used. Reviewed that Dr. Karin Lieu wanted her to wrap it at [facility]. Gave her some instructions on how to wrap. Also gave her the phone number to [facility] so she could call them. She missed their call yesterday because she was still in the hospital and she didn't know how to contact them. Pt to call. Confirmed understanding.

## 2023-02-24 ENCOUNTER — Ambulatory Visit: Payer: Self-pay

## 2023-02-24 ENCOUNTER — Telehealth: Payer: Self-pay

## 2023-02-24 ENCOUNTER — Telehealth: Payer: Self-pay | Admitting: *Deleted

## 2023-02-24 NOTE — Telephone Encounter (Signed)
Patient called in stating the oxycodone that she was given for pain at right AKA is "tearing my stomach up." States she is trying to eat when she takes this med but then immediately vomits. She is requesting an alternate med be sent to [facility] at [facility].

## 2023-02-24 NOTE — Patient Instructions (Signed)
Visit Information  Thank you for taking time to visit with me today. Please don't hesitate to contact me if I can be of assistance to you.   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 on 03/02/23 -Patient/Caregiver will self-administer medications as prescribed as evidenced by self-report/primary caregiver report  -Patient/Caregiver will attend all scheduled provider appointments as evidenced by clinician review of documented attendance to scheduled appointments and patient/caregiver report -Patient/Caregiver will call pharmacy for medication refills as evidenced by patient report and review of pharmacy fill history as appropriate -Patient/Caregiver will call provider office for new concerns or questions as evidenced by review of documented incoming telephone call notes and patient report          COMPLETED: I want to understand what is going on with my stomach       Patient Goals/Self Care Activities: -Patient/Caregiver will self-administer medications as prescribed as evidenced by self-report/primary caregiver report  -Patient/Caregiver will attend all scheduled provider appointments as evidenced by clinician review of documented attendance to scheduled appointments and patient/caregiver report -Patient/Caregiver will call pharmacy for medication refills as evidenced by patient report and review of pharmacy fill history as appropriate -Patient/Caregiver will call  provider office for new concerns or questions as evidenced by review of documented incoming telephone call notes and patient report -Patient/Caregiver verbalizes understanding of plan -Patient/Caregiver will focus on medication adherence by taking medications as prescribed  Care Coordination Interventions: Reviewed medications with patient and discussed  Discussed plans with patient for ongoing care management follow up and provided patient with direct contact information for care management team Talked with the patient about her symptoms and asked her to talk with the nutritionist at dialysis about gastroparesis. Th patient stated that she o longer has issues with her stomach.        Our next appointment is by telephone on 03/19/23 at 11 am  Please call the care guide team at [phone] if you need to cancel or reschedule your appointment.   If you are experiencing a Mental Health or Behavioral Health Crisis or need someone to talk to, please call 1-800-273-TALK (toll free, 24 hour hotline)  The patient verbalized understanding of instructions, educational materials, and care plan provided today and agreed to receive a mailed copy of patient instructions, educational materials, and care plan.   Juanell Fairly RN, BSN, [facility] Care Coordinator [facility]   Phone: [phone]

## 2023-02-24 NOTE — Transitions of Care (Post Inpatient/ED Visit) (Signed)
   02/24/2023  Name: Kathy Frank MRN: [id number] DOB: September 10, 1975  Today's TOC FU Call Status: Today's TOC FU Call Status:: Unsuccessful Call (2nd Attempt) Unsuccessful Call (2nd Attempt) Date: 02/24/23  Attempted to reach the patient regarding the most recent Inpatient/ED visit.  Follow Up Plan: Additional outreach attempts will be made to reach the patient to complete the Transitions of Care (Post Inpatient/ED visit) call.   [name], RN, BSN, CCM Care Management Coordinator [place] Phone: [phone]/Fax: [phone]

## 2023-02-24 NOTE — Telephone Encounter (Signed)
Received call from [place], PT with [facility] informing us of date change for [facility]. Patient not feeling well today, has dialysis tomorrow. Will begin [facility] PT for post-op right AKA on 7/19 at 1000.

## 2023-02-24 NOTE — Patient Outreach (Signed)
Care Coordination   Follow Up Visit Note   02/24/2023 Name: Kathy Frank MRN: [id number] DOB: 1975-12-23  Kathy Frank is a 47 y.o. year old female who sees Tawkaliyar, Roya, DO for primary care. I spoke with  [name] by phone today.  What matters to the patients health and wellness today?  Kathy Frank, who was recently discharged from the hospital due to an infection in her right lower extremity, which was BKA, and she had to have an AKA, is following her physician's instructions. [facility] was scheduled for her at the hospital. Her PCP appointment is on 03/02/23. She reports that her stomach no longer bothers her, and her bowel movements are regular, a significant improvement from her previous condition. However, she experiences pain from the oxycodone they prescribed. She took the initiative to call the office and request an alternative medication, as the oxycodone was causing stomach discomfort.   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 and patient's adherence to plan as established by provider Advised patient to follow protocol established by the physician Reviewed medications with patient and discussed pain medication and advised her to talk with her PCP about the medication and what to do Reviewed scheduled/upcoming provider appointments including [facility] on 03/02/23 -Patient/Caregiver will self-administer medications as prescribed as evidenced by self-report/primary caregiver report  -Patient/Caregiver will attend all scheduled provider appointments as evidenced by clinician review of documented attendance to scheduled appointments and patient/caregiver report -Patient/Caregiver will call pharmacy for medication refills as  evidenced by patient report and review of pharmacy fill history as appropriate -Patient/Caregiver will call provider office for new concerns or questions as evidenced by review of documented incoming telephone call notes and patient report          COMPLETED: I want to understand what is going on with my stomach       Patient Goals/Self Care Activities: -Patient/Caregiver will self-administer medications as prescribed as evidenced by self-report/primary caregiver report  -Patient/Caregiver will attend all scheduled provider appointments as evidenced by clinician review of documented attendance to scheduled appointments and patient/caregiver report -Patient/Caregiver will call pharmacy for medication refills as evidenced by patient report and review of pharmacy fill history as appropriate -Patient/Caregiver will call provider office for new concerns or questions as evidenced by review of documented incoming telephone call notes and patient report -Patient/Caregiver verbalizes understanding of plan -Patient/Caregiver will focus on medication adherence by taking medications as prescribed  Care Coordination Interventions: Reviewed medications with patient and discussed  Discussed plans with patient for ongoing care management follow up and provided patient with direct contact information for care management team Talked with the patient about her symptoms and asked her to talk with the nutritionist at dialysis about gastroparesis. Th patient stated that she o longer has issues with her stomach.        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 Visit Completed   Juanell Fairly RN, BSN, [facility] Care Coordinator [facility]   Phone: [phone]

## 2023-02-25 ENCOUNTER — Telehealth: Payer: Self-pay

## 2023-02-25 ENCOUNTER — Emergency Department (HOSPITAL_COMMUNITY)
Admission: EM | Admit: 2023-02-25 | Discharge: 2023-02-25 | Disposition: A | Discharge status: Home / Self Care | Payer: Medicare Other | Attending: Emergency Medicine | Admitting: Emergency Medicine

## 2023-02-25 ENCOUNTER — Other Ambulatory Visit: Payer: Self-pay

## 2023-02-25 ENCOUNTER — Emergency Department (HOSPITAL_COMMUNITY): Payer: Medicare Other

## 2023-02-25 ENCOUNTER — Encounter (HOSPITAL_COMMUNITY): Payer: Self-pay

## 2023-02-25 DIAGNOSIS — R031 Nonspecific low blood-pressure reading: Secondary | ICD-10-CM

## 2023-02-25 DIAGNOSIS — D649 Anemia, unspecified: Secondary | ICD-10-CM

## 2023-02-25 DIAGNOSIS — Z992 Dependence on renal dialysis: Secondary | ICD-10-CM | POA: Diagnosis not present

## 2023-02-25 DIAGNOSIS — N186 End stage renal disease: Secondary | ICD-10-CM | POA: Diagnosis not present

## 2023-02-25 DIAGNOSIS — R110 Nausea: Secondary | ICD-10-CM | POA: Diagnosis not present

## 2023-02-25 LAB — HCG, SERUM, QUALITATIVE: Preg, Serum: NEGATIVE

## 2023-02-25 LAB — CBC
HCT: 23.6 % — ABNORMAL LOW (ref 36.0–46.0)
Hemoglobin: 7.6 g/dL — ABNORMAL LOW (ref 12.0–15.0)
MCH: 29 pg (ref 26.0–34.0)
MCHC: 32.2 g/dL (ref 30.0–36.0)
MCV: 90.1 fL (ref 80.0–100.0)
Platelets: 331 10*3/uL (ref 150–400)
RBC: 2.62 MIL/uL — ABNORMAL LOW (ref 3.87–5.11)
RDW: 18.7 % — ABNORMAL HIGH (ref 11.5–15.5)
WBC: 8 10*3/uL (ref 4.0–10.5)
nRBC: 0 % (ref 0.0–0.2)

## 2023-02-25 LAB — BASIC METABOLIC PANEL
Anion gap: 13 (ref 5–15)
BUN: 24 mg/dL — ABNORMAL HIGH (ref 6–20)
CO2: 24 mmol/L (ref 22–32)
Calcium: 9 mg/dL (ref 8.9–10.3)
Chloride: 95 mmol/L — ABNORMAL LOW (ref 98–111)
Creatinine, Ser: 5.98 mg/dL — ABNORMAL HIGH (ref 0.44–1.00)
GFR, Estimated: 8 mL/min — ABNORMAL LOW (ref >60)
Glucose, Bld: 231 mg/dL — ABNORMAL HIGH (ref 70–99)
Potassium: 3.5 mmol/L (ref 3.5–5.1)
Sodium: 132 mmol/L — ABNORMAL LOW (ref 135–145)

## 2023-02-25 LAB — TROPONIN I (HIGH SENSITIVITY): Troponin I (High Sensitivity): 35 ng/L — ABNORMAL HIGH (ref <18)

## 2023-02-25 MED ORDER — ONDANSETRON 4 MG PO TBDP
8.0000 mg | ORAL_TABLET | Freq: Once | ORAL | Status: AC
  Administered 2023-02-25: 8 mg via ORAL
  Filled 2023-02-25: qty 2

## 2023-02-25 MED ORDER — HYDROMORPHONE HCL 2 MG PO TABS: 1.0000 mg | ORAL_TABLET | Freq: Four times a day (QID) | ORAL | 0 refills | Status: DC | PRN

## 2023-02-25 MED ORDER — AMLODIPINE-OLMESARTAN 5-20 MG PO TABS: 1.0000 | ORAL_TABLET | Freq: Every day | ORAL | 0 refills | Status: DC

## 2023-02-25 MED ORDER — ONDANSETRON 8 MG PO TBDP: 8.0000 mg | ORAL_TABLET | Freq: Three times a day (TID) | ORAL | 0 refills | Status: DC | PRN

## 2023-02-25 MED ORDER — TRAMADOL HCL 50 MG PO TABS
50.0000 mg | ORAL_TABLET | Freq: Once | ORAL | Status: DC
  Filled 2023-02-25: qty 1

## 2023-02-25 NOTE — ED Triage Notes (Signed)
Pt states she was hypotensive at dialysis. PT states it was either 50/70 or 70/50. Pt states they could dialyze her, because of her hypotension and because of her dry weight from being in hosp. Pt states she feels dizzy and weak started this morning

## 2023-02-25 NOTE — Transitions of Care (Post Inpatient/ED Visit) (Signed)
   02/25/2023  Name: Kathy Frank MRN: [id number] DOB: [birth year]  Today's TOC FU Call Status: Today's TOC FU Call Status:: Unsuccessful Call (3rd Attempt) Unsuccessful Call (3rd Attempt) Date: 02/25/23  Attempted to reach the patient regarding the most recent Inpatient/ED visit.  Follow Up Plan: No further outreach attempts will be made at this time. We have been unable to contact the patient.  [name], RN, BSN, CCM Care Management Coordinator [place] Phone: [phone]/Fax: [phone]

## 2023-02-25 NOTE — Discharge Instructions (Addendum)
It was our pleasure to provide your ER care today - we hope that you feel better.  Drink plenty of fluids/stay well hydrated.  For now (as blood pressure on the low side), decrease the dose of your amlodipine/olmesartan to 5 mg/20 mg, as opposed to current dose of 10 mg/40 mg. Monitor blood pressures and record values. Your hemoglobin (blood count) also remains low. Follow up closely with your doctor and nephrologist this coming week for recheck, recheck of blood pressure, and  to discuss plan for blood pressure medication and dose moving forward, as well as anemia and the plan to address that issue as well.  If pain medication causes nausea, you may take zofran as need for nausea.   Return to ER if worse, new symptoms, fevers, chest pain, trouble breathing, fainting, infection of wound/stump, or other concern.

## 2023-02-25 NOTE — Telephone Encounter (Signed)
Patient is in waiting room asking about alternate med for pain. Please advise.

## 2023-02-25 NOTE — ED Notes (Signed)
Pt is bilateral BKA.

## 2023-02-25 NOTE — Telephone Encounter (Signed)
Called patient this afternoon. She states not tolerating oxycodone-acetaminophen that was given at discharge. States cannot keep it down. She was on po dilaudid during hospital course, tolerated that. Discussed with patient will only send short course of po dilaudid 1 mg q6h PRN for severe/breakthrough for 5 days (10 tablets, 0 refills) for postop pain. PDMP reviewed. Precautions given if still unable to tolerate po and may need to be evaluated in office. Patient verbalizes understanding an all questions addressed.

## 2023-02-25 NOTE — ED Provider Notes (Signed)
[name] EMERGENCY DEPARTMENT AT [facility] Provider Note   CSN: [id number] Arrival date & time: 02/25/23  1055     History  Chief Complaint  Patient presents with   Hypotension    Kathy Frank is a 47 y.o. female.  Pt with hx esrd/hd, went to HD today, and states 'they cleaned my blood, but couldn't pull off a lo of fluid b/c my blood pressure was low'.  On arrival to ED, bp is normal. 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 Has some pain at stump site, but no acute worsening of that pain, no redness or drainage from wound. Pt denies fever, chills, sweats. Indicates at times her pain medication makes her nauseated, so some decrease po intake in past couple days (although is drinking, tolerating a bottle of juice now). No abd pain or distension. Having normal bms. No chest pain or discomfort. No sob or unusual doe.         Home Medications Prior to Admission medications   Medication Sig Start Date End Date Taking? Authorizing Provider  Accu-Chek Softclix Lancets lancets Use as instructed 05/02/21   Atway, Rayann N, DO  acetaminophen (TYLENOL) 500 MG tablet Take 2 tablets (1,000 mg total) by mouth every 6 (six) hours. 12/25/22   Marrianne Mood, MD  albuterol (VENTOLIN HFA) 108 (90 Base) MCG/ACT inhaler Inhale 2 puffs into the lungs every 4 (four) hours as needed for wheezing or shortness of breath. 02/14/21   Angiulli, Mcarthur Rossetti, PA-C  amLODipine-olmesartan (AZOR) 10-40 MG tablet Take 1 tablet by mouth every evening. 02/09/23   [provider]  atorvastatin (LIPITOR) 80 MG tablet Take 1 tablet (80 mg total) by mouth daily. 02/01/23   Willette Cluster, MD  bictegravir-emtricitabine-tenofovir AF (BIKTARVY) 50-200-25 MG TABS tablet Take 1 tablet by mouth daily. OVERDUE FOR OFFICE FOLLOW UP - PLEASE CALL OUR OFFICE TO SCHEDULE [phone] 02/23/23   Veryl Speak, FNP  bisacodyl (DULCOLAX) 10 MG suppository Place 1  suppository (10 mg total) rectally as needed for moderate constipation. Patient not taking: Reported on 02/16/2023 12/25/22   Marrianne Mood, MD  calcitRIOL (ROCALTROL) 0.25 MCG capsule Take 5 capsules (1.25 mcg total) by mouth Every Tuesday,Thursday,and Saturday with dialysis. 07/02/22   Marolyn Haller, MD  calcium acetate (PHOSLO) 667 MG capsule Take 2 capsules (1,334 mg total) by mouth 3 (three) times daily with meals. 06/30/22   Marolyn Haller, MD  Continuous Blood Gluc Receiver (DEXCOM G7 RECEIVER) DEVI Use to check blood sugars cntinuously Patient not taking: Reported on 02/03/2023 06/30/22   Marolyn Haller, MD  Continuous Blood Gluc Sensor (DEXCOM G7 SENSOR) MISC Use to check blood sugar continuously Patient not taking: Reported on 02/03/2023 06/30/22   Marolyn Haller, MD  cyclobenzaprine (FLEXERIL) 5 MG tablet Take 5 mg by mouth 2 (two) times daily as needed for muscle spasms.    [provider]  Darbepoetin Alfa (ARANESP) 200 MCG/0.4ML SOSY injection Inject 0.4 mLs (200 mcg total) into the skin every Monday at 6 PM. Patient taking differently: Inject 200 mcg into the skin every 7 (seven) days. Every Monday 07/06/22   Marolyn Haller, MD  diclofenac Sodium (VOLTAREN) 1 % GEL Apply 4 g topically 4 (four) times daily. Patient taking differently: Apply 4 g topically daily as needed (for pain). 10/02/22   Adron Bene, MD  EQ ASPIRIN ADULT LOW DOSE 81 MG tablet TAKE 1 TABLET BY MOUTH ONCE DAILY. SWALLOW WHOLE.  Patient taking differently: Take 81 mg by mouth daily. 02/06/22   Maeola Harman, MD  escitalopram (LEXAPRO) 5 MG tablet Take 1 tablet (5 mg total) by mouth daily. 02/01/23   Willette Cluster, MD  gabapentin (NEURONTIN) 100 MG capsule Take 100 mg by mouth at bedtime. 09/10/22   [provider]  glucose blood test strip Use as instructed 05/02/21   Atway, Rayann N, DO  hydrocortisone-pramoxine (PROCTOFOAM-HC) rectal foam Place 1 applicator rectally 2  (two) times daily. 11/07/22   Dione Booze, MD  insulin glargine (LANTUS SOLOSTAR) 100 UNIT/ML Solostar Pen Inject 15 Units into the skin at bedtime. 02/01/23   Willette Cluster, MD  Insulin Pen Needle (PENTIPS) 32G X 4 MM MISC Use as directed 11/10/22   Quincy Simmonds, MD  lidocaine (LIDODERM) 5 % Place 1 patch onto the skin daily. Remove & Discard patch within 12 hours or as directed by MD Patient taking differently: Place 1 patch onto the skin daily as needed (For pain). 10/03/22   Adron Bene, MD  liraglutide (VICTOZA) 18 MG/3ML SOPN Inject 0.6 mg into the skin daily. 11/03/22 10/29/23  Adron Bene, MD  LOKELMA 5 g packet Take 5 g by mouth daily. 07/30/22   [provider]  multivitamin (RENA-VIT) TABS tablet Take 1 tablet by mouth daily. Patient taking differently: Take 1 tablet by mouth See admin instructions. On Tuesday, Wednesday and Thursday 02/14/21   Angiulli, Mcarthur Rossetti, PA-C  ondansetron (ZOFRAN) 8 MG tablet Take 1 tablet (8 mg total) by mouth every 8 (eight) hours as needed for nausea or vomiting. 12/25/22   Marrianne Mood, MD  oxyCODONE-acetaminophen (PERCOCET) 5-325 MG tablet Take 1 tablet by mouth every 4 (four) hours as needed for up to 5 days for severe pain. 02/22/23 02/27/23  Morene Crocker, MD  pantoprazole (PROTONIX) 40 MG tablet Take 1 tablet (40 mg total) by mouth 2 (two) times daily. 12/25/22   Marrianne Mood, MD  polyethylene glycol (MIRALAX / GLYCOLAX) 17 g packet Take 17 g by mouth 2 (two) times daily. 12/25/22   Marrianne Mood, MD  senna-docusate (SENOKOT-S) 8.6-50 MG tablet Take 1 tablet by mouth at bedtime. 12/25/22   Marrianne Mood, MD  Ubrogepant (UBRELVY) 50 MG TABS Take 1 tablet (50 mg total) by mouth as needed (migraine headache). if needed a second dose may be taken at least 2 hours after the initial dose; MAX 8 doses in a month 01/25/23   Quincy Simmonds, MD      Allergies    Patient has no known allergies.    Review of Systems   Review of  Systems  Constitutional:  Negative for chills and fever.  HENT:  Negative for sore throat.   Eyes:  Negative for redness.  Respiratory:  Negative for cough and shortness of breath.   Cardiovascular:  Negative for chest pain.  Gastrointestinal:  Positive for nausea and vomiting. Negative for abdominal pain, constipation and diarrhea.  Genitourinary:  Negative for dysuria, flank pain, vaginal bleeding and vaginal discharge.  Musculoskeletal:  Negative for back pain and neck pain.  Skin:  Negative for rash.  Neurological:  Negative for headaches.  Psychiatric/Behavioral:  Negative for confusion.     Physical Exam Updated Vital Signs BP 106/63   Pulse 89   Temp 98.3 F (36.8 C)   Resp 16   Ht 1.651 m (5\' 5" )   Wt 67 kg   SpO2 100%   BMI 24.58 kg/m  Physical Exam Vitals and nursing note reviewed.  Constitutional:  Appearance: Normal appearance. She is well-developed.  HENT:     Head: Atraumatic.     Nose: Nose normal.     Mouth/Throat:     Mouth: Mucous membranes are moist.  Eyes:     General: No scleral icterus.    Conjunctiva/sclera: Conjunctivae normal.  Neck:     Trachea: No tracheal deviation.  Cardiovascular:     Rate and Rhythm: Normal rate and regular rhythm.     Pulses: Normal pulses.     Heart sounds: Normal heart sounds. No murmur heard.    No friction rub. No gallop.  Pulmonary:     Effort: Pulmonary effort is normal. No respiratory distress.     Breath sounds: Normal breath sounds.     Comments: HD cath right chest without sign of infection to area.  Abdominal:     General: Bowel sounds are normal. There is no distension.     Palpations: Abdomen is soft.     Tenderness: There is no abdominal tenderness. There is no guarding.  Genitourinary:    Comments: No cva tenderness.  Musculoskeletal:        General: No swelling or tenderness.     Cervical back: Normal range of motion and neck supple. No rigidity. No muscular tenderness.     Comments: Right  aka stump, incision/staples intact, no sign of infection to area.   Skin:    General: Skin is warm and dry.     Findings: No rash.  Neurological:     Mental Status: She is alert.     Comments: Alert, speech normal. Motor/sens grossly intact bil.   Psychiatric:        Mood and Affect: Mood normal.     ED Results / Procedures / Treatments   Labs (all labs ordered are listed, but only abnormal results are displayed) Results for orders placed or performed during the hospital encounter of 02/25/23  Basic metabolic panel  Result Value Ref Range   Sodium 132 (L) 135 - 145 mmol/L   Potassium 3.5 3.5 - 5.1 mmol/L   Chloride 95 (L) 98 - 111 mmol/L   CO2 24 22 - 32 mmol/L   Glucose, Bld 231 (H) 70 - 99 mg/dL   BUN 24 (H) 6 - 20 mg/dL   Creatinine, Ser 1.61 (H) 0.44 - 1.00 mg/dL   Calcium 9.0 8.9 - 09.6 mg/dL   GFR, Estimated 8 (L) >60 mL/min   Anion gap 13 5 - 15  CBC  Result Value Ref Range   WBC 8.0 4.0 - 10.5 K/uL   RBC 2.62 (L) 3.87 - 5.11 MIL/uL   Hemoglobin 7.6 (L) 12.0 - 15.0 g/dL   HCT 04.5 (L) 40.9 - 81.1 %   MCV 90.1 80.0 - 100.0 fL   MCH 29.0 26.0 - 34.0 pg   MCHC 32.2 30.0 - 36.0 g/dL   RDW 91.4 (H) 78.2 - 95.6 %   Platelets 331 150 - 400 K/uL   nRBC 0.0 0.0 - 0.2 %  hCG, serum, qualitative  Result Value Ref Range   Preg, Serum NEGATIVE NEGATIVE  Troponin I (High Sensitivity)  Result Value Ref Range   Troponin I (High Sensitivity) 35 (H) <18 ng/L   *Note: Due to a large number of results and/or encounters for the requested time period, some results have not been displayed. A complete set of results can be found in Results Review.   DG Chest 2 View  Result Date: 02/25/2023 CLINICAL DATA:  Hypotension EXAM: CHEST -  2 VIEW COMPARISON:  Previous studies including the examination of 12/23/2022 FINDINGS: Transverse diameter of heart is increased. Central pulmonary vessels are less prominent. There are no signs of pulmonary edema or focal pulmonary consolidation. There is  no pleural effusion or pneumothorax. Tip of right IJ dialysis catheter is seen in superior vena cava close to the right atrium. IMPRESSION: Cardiomegaly. There are no signs of pulmonary edema or focal pulmonary consolidation. Electronically Signed   By: Ernie Avena M.D.   On: 02/25/2023 12:55    EKG EKG Interpretation Date/Time:  Thursday February 25 2023 11:08:52 EDT Ventricular Rate:  90 PR Interval:  156 QRS Duration:  90 QT Interval:  392 QTC Calculation: 479 R Axis:   28  Text Interpretation: Normal sinus rhythm Non-specific ST-t changes `no acute change from prior ecg, 10/2022 Confirmed by Cathren Laine ([id number]) on 02/25/2023 12:43:48 PM  Radiology DG Chest 2 View  Result Date: 02/25/2023 CLINICAL DATA:  Hypotension EXAM: CHEST - 2 VIEW COMPARISON:  Previous studies including the examination of 12/23/2022 FINDINGS: Transverse diameter of heart is increased. Central pulmonary vessels are less prominent. There are no signs of pulmonary edema or focal pulmonary consolidation. There is no pleural effusion or pneumothorax. Tip of right IJ dialysis catheter is seen in superior vena cava close to the right atrium. IMPRESSION: Cardiomegaly. There are no signs of pulmonary edema or focal pulmonary consolidation. Electronically Signed   By: Ernie Avena M.D.   On: 02/25/2023 12:55    Procedures Procedures    Medications Ordered in ED Medications  traMADol (ULTRAM) tablet 50 mg (50 mg Oral Not Given 02/25/23 1320)  ondansetron (ZOFRAN-ODT) disintegrating tablet 8 mg (8 mg Oral Given 02/25/23 1320)    ED Course/ Medical Decision Making/ A&P                             Medical Decision Making Problems Addressed: Chronic anemia: chronic illness or injury    Details: Acute/chronic ESRD on dialysis [facility]): chronic illness or injury with exacerbation, progression, or side effects of treatment that poses a threat to life or bodily functions Low blood pressure, not hypotension: acute  illness or injury with systemic symptoms that poses a threat to life or bodily functions Nausea: acute illness or injury with systemic symptoms  Amount and/or Complexity of Data Reviewed Independent Historian:     Details: Neph, hx External Data Reviewed: labs and notes. Labs: ordered. Decision-making details documented in ED Course. Radiology: ordered and independent interpretation performed. Decision-making details documented in ED Course. ECG/medicine tests: ordered and independent interpretation performed. Decision-making details documented in ED Course. Discussion of management or test interpretation with external provider(s): Nephrology, discussed pt.   Risk Prescription drug management. Decision regarding hospitalization.   Iv ns. Continuous pulse ox and cardiac monitoring. Labs ordered/sent.  Differential diagnosis includes anemia, dehydration, etc. Dispo decision including potential need for admission considered - will get labs and imaging and reassess.   Reviewed nursing notes and prior charts for additional history. External reports reviewed. During recent hospitalization, it appears most of pts bps between the range of 90 and 120. Additional history from: EMS.   Cardiac monitor: sinus rhythm, rate 80.  Initial bp normal. Repeat bp also normal, 106/70.   Labs reviewed/interpreted by me - wbc normal. Hgb 7.6 close to prior of 8.2. pt had recent surgery, no recent ongoing blood loss since. No vaginal bleeding, no rectal bleeding or melena. Bun c/w prior. K  normal.   Zofran po, ultram po. Po fluids.   Discuss pt with renal consult Dr Thedore Mins, indicates is ok with pt d/c, can f/u as outpatient.   Pt tolerating po, no recurrent emesis in ED, abd soft nt. Pt currently appears stable for d/c.   Rec close pcp f/u.  Return precautions provided.          Final Clinical Impression(s) / ED Diagnoses Final diagnoses:  Low blood pressure, not hypotension  ESRD on dialysis  (HCC)  Nausea  Chronic anemia    Rx / DC Orders ED Discharge Orders     None         Cathren Laine, MD 02/25/23 1431

## 2023-02-25 NOTE — ED Notes (Signed)
Got patient into a gown on the monitor patient is resting with call bell in reach 

## 2023-03-01 ENCOUNTER — Ambulatory Visit: Payer: Medicare Other

## 2023-03-02 ENCOUNTER — Other Ambulatory Visit (HOSPITAL_COMMUNITY): Payer: Self-pay

## 2023-03-02 ENCOUNTER — Encounter: Payer: Self-pay | Admitting: Student

## 2023-03-02 ENCOUNTER — Encounter (HOSPITAL_COMMUNITY): Payer: Self-pay

## 2023-03-02 ENCOUNTER — Ambulatory Visit (INDEPENDENT_AMBULATORY_CARE_PROVIDER_SITE_OTHER): Payer: Medicare Other | Admitting: Student

## 2023-03-02 VITALS — BP 139/59 | HR 85 | Temp 98.4°F

## 2023-03-02 DIAGNOSIS — F331 Major depressive disorder, recurrent, moderate: Secondary | ICD-10-CM | POA: Diagnosis not present

## 2023-03-02 DIAGNOSIS — E669 Obesity, unspecified: Secondary | ICD-10-CM

## 2023-03-02 DIAGNOSIS — F410 Panic disorder [episodic paroxysmal anxiety] without agoraphobia: Secondary | ICD-10-CM

## 2023-03-02 DIAGNOSIS — Z89511 Acquired absence of right leg below knee: Secondary | ICD-10-CM

## 2023-03-02 DIAGNOSIS — E1169 Type 2 diabetes mellitus with other specified complication: Secondary | ICD-10-CM | POA: Diagnosis not present

## 2023-03-02 DIAGNOSIS — K5903 Drug induced constipation: Secondary | ICD-10-CM

## 2023-03-02 DIAGNOSIS — T879 Unspecified complications of amputation stump: Secondary | ICD-10-CM

## 2023-03-02 DIAGNOSIS — Z794 Long term (current) use of insulin: Secondary | ICD-10-CM

## 2023-03-02 LAB — POCT GLYCOSYLATED HEMOGLOBIN (HGB A1C): Hemoglobin A1C: 6.1 % — AB (ref 4.0–5.6)

## 2023-03-02 LAB — GLUCOSE, CAPILLARY: Glucose-Capillary: 168 mg/dL — ABNORMAL HIGH (ref 70–99)

## 2023-03-02 MED ORDER — LIRAGLUTIDE 18 MG/3ML ~~LOC~~ SOPN: 0.6000 mg | PEN_INJECTOR | Freq: Every day | SUBCUTANEOUS | 11 refills | Status: DC

## 2023-03-02 MED ORDER — ASPIRIN 81 MG PO TBEC
81.0000 mg | DELAYED_RELEASE_TABLET | Freq: Every day | ORAL | 3 refills | Status: DC
Start: 2023-03-02 — End: 2023-04-29

## 2023-03-02 MED ORDER — DEXCOM G7 RECEIVER DEVI
0 refills | Status: DC
Start: 2023-03-02 — End: 2023-04-27

## 2023-03-02 MED ORDER — LIRAGLUTIDE 18 MG/3ML ~~LOC~~ SOPN
0.6000 mg | PEN_INJECTOR | Freq: Every day | SUBCUTANEOUS | 11 refills | Status: AC
Start: 2023-03-02 — End: ?
  Filled 2023-03-02: qty 6, 60d supply, fill #0
  Filled 2023-03-02 – 2023-04-21 (×2): qty 3, 30d supply, fill #0
  Filled 2023-04-22 (×2): qty 9, 90d supply, fill #0

## 2023-03-02 MED ORDER — HYDROMORPHONE HCL 2 MG PO TABS
1.0000 mg | ORAL_TABLET | Freq: Two times a day (BID) | ORAL | 0 refills | Status: AC | PRN
Start: 2023-03-02 — End: 2023-03-12

## 2023-03-02 MED ORDER — SENNA 8.6 MG PO TABS
1.0000 | ORAL_TABLET | Freq: Every day | ORAL | 0 refills | Status: DC | PRN
Start: 2023-03-02 — End: 2023-04-29

## 2023-03-02 MED ORDER — BUSPIRONE HCL 5 MG PO TABS
2.5000 mg | ORAL_TABLET | Freq: Two times a day (BID) | ORAL | Status: DC
Start: 2023-03-02 — End: 2023-04-21

## 2023-03-02 NOTE — Assessment & Plan Note (Addendum)
Ms. Rison is presenting to the clinic today for a hospital follow-up for which she was admitted and managed for hypertension.  Of note, she had a right BKA a couple of weeks ago and expresses concerns of postop pain at the stump site.  She said she was given oxycodone at the hospital but she had abdominal discomfort and was switched to Dilaudid and thinks Dilaudid helps better with the postop pain.  She rate the pain 8 out of 10 in the office today.  Right stump is well-dressed no drainage at this time, stump well-healed no erythema warmth or signs of active infection at this time.  Will consider prescribing 10 days of 1 mg Dilaudid and follow-up with the patient after the course.If  pain does not seems to be easing down or improve, after 10 days I will consider reaching out to vascular surgery for further evaluation, and possibly consider phantom leg syndrome. Patient also expresses concerns that her old wheelchair does not have an adjustable arm which makes it difficult for her to be transferred to bed or to use the bathroom.  Will consider sending in a DME referral for a new wheelchair with adjustable arm. PLAN -Prescribe 1 mg of Dilaudid twice daily for 10 days - DME referral for a new wheel chair

## 2023-03-02 NOTE — Patient Instructions (Addendum)
Thank you, [name] for allowing [place] to provide your care today. Today we discussed your general health, recent BKA, panic attack,sugar levels and need for a new wheel chair. I will send your new medication to [facility] for now. You can always switch to mail in order with them as well.  I have ordered the following labs for you:  Lab Orders         Glucose, capillary         POC Hbg A1C      Tests ordered today:    Referrals ordered today:   Referral Orders         Ambulatory referral to diabetic education       I have ordered the following medication/changed the following medications:   Stop the following medications: Medications Discontinued During This Encounter  Medication Reason   EQ ASPIRIN ADULT LOW DOSE 81 MG tablet Reorder   liraglutide (VICTOZA) 18 MG/3ML SOPN Reorder   HYDROmorphone (DILAUDID) 2 MG tablet Reorder     Start the following medications: Meds ordered this encounter  Medications   aspirin EC (EQ ASPIRIN ADULT LOW DOSE) 81 MG tablet    Sig: Take 1 tablet (81 mg total) by mouth daily. Swallow whole.    Dispense:  30 tablet    Refill:  3   busPIRone (BUSPAR) tablet 2.5 mg   senna (SENOKOT) 8.6 MG TABS tablet    Sig: Take 1 tablet (8.6 mg total) by mouth daily as needed for mild constipation.    Dispense:  120 tablet    Refill:  0   liraglutide (VICTOZA) 18 MG/3ML SOPN    Sig: Inject 0.6 mg into the skin daily.    Dispense:  3 mL    Refill:  11   HYDROmorphone (DILAUDID) 2 MG tablet    Sig: Take 0.5 tablets (1 mg total) by mouth 2 (two) times daily as needed for up to 10 days for severe pain.    Dispense:  20 tablet    Refill:  0    Post-op pain     Follow up: 6 months   Remember:   Should you have any questions or concerns please call the internal medicine clinic at [phone].    Kathleen Lime, M.D [facility]

## 2023-03-02 NOTE — Progress Notes (Signed)
CC: Hospital follow up and A1c check up  HPI:  Kathy Frank is a 47 y.o. female living with a history stated below and presents today for hospital follow up and A1c check up. Please see problem based assessment and plan for additional details.  Past Medical History:  Diagnosis Date   Analgesic overuse headache 06/28/2021   Anemia, posthemorrhagic, acute 10/14/2022   Anxiety    CAD (coronary artery disease)    CAP (community acquired pneumonia) 09/29/2022   CVA (cerebral vascular accident) [facility])    Depression 06/28/2006   Qualifier: Diagnosis of  By: Laveda Abbe MD, Todd     Diabetes mellitus type 2 in obese 06/28/1994   Dyspnea    uses oxygeb 2 liters per minute at dialysis   End stage renal disease on dialysis [facility])    on hemodialysis T/Th/Sat   Erosive esophagitis    Esophageal reflux    Eye redness    Fecal incontinence 02/04/2018   Gastroparesis    ? diabetic   GERD (gastroesophageal reflux disease)    Human immunodeficiency virus (HIV) disease (HCC) 04/23/2016   Hyperkalemia 10/12/2022   Hyperlipidemia    Hypertension    Hypertensive emergency 11/09/2021   Hypertensive urgency 05/20/2021   Metabolic bone disease 05/02/2019   Moderate nonproliferative diabetic retinopathy of both eyes (HCC) 11/21/2014   11/14/14: Noted on retinal imaging; needs follow-up imaging in 6 months  05/22/16: Noted on retinal imaging again; needs follow-up imaging in 6 months   PAD (peripheral artery disease) (HCC)    Type 2 diabetes mellitus with diabetic peripheral angiopathy without gangrene (HCC) 05/01/2019   Wound infection s/p L transmetatarsal amputation     Current Outpatient Medications on File Prior to Visit  Medication Sig Dispense Refill   Accu-Chek Softclix Lancets lancets Use as instructed 100 each 12   acetaminophen (TYLENOL) 500 MG tablet Take 2 tablets (1,000 mg total) by mouth every 6 (six) hours. 30 tablet 0   albuterol (VENTOLIN HFA) 108 (90 Base) MCG/ACT inhaler  Inhale 2 puffs into the lungs every 4 (four) hours as needed for wheezing or shortness of breath. 8.5 g 0   amLODipine-olmesartan (AZOR) 10-40 MG tablet Take 1 tablet by mouth every evening.     amLODipine-olmesartan (AZOR) 5-20 MG tablet Take 1 tablet by mouth daily. 30 tablet 0   atorvastatin (LIPITOR) 80 MG tablet Take 1 tablet (80 mg total) by mouth daily. 30 tablet 11   bictegravir-emtricitabine-tenofovir AF (BIKTARVY) 50-200-25 MG TABS tablet Take 1 tablet by mouth daily. OVERDUE FOR OFFICE FOLLOW UP - PLEASE CALL OUR OFFICE TO SCHEDULE [phone] 30 tablet 5   bisacodyl (DULCOLAX) 10 MG suppository Place 1 suppository (10 mg total) rectally as needed for moderate constipation. (Patient not taking: Reported on 02/16/2023) 12 suppository 0   calcitRIOL (ROCALTROL) 0.25 MCG capsule Take 5 capsules (1.25 mcg total) by mouth Every Tuesday,Thursday,and Saturday with dialysis. 60 capsule 0   calcium acetate (PHOSLO) 667 MG capsule Take 2 capsules (1,334 mg total) by mouth 3 (three) times daily with meals. 180 capsule 0   Continuous Blood Gluc Receiver (DEXCOM G7 RECEIVER) DEVI Use to check blood sugars cntinuously (Patient not taking: Reported on 02/03/2023) 5 each 5   Continuous Blood Gluc Sensor (DEXCOM G7 SENSOR) MISC Use to check blood sugar continuously (Patient not taking: Reported on 02/03/2023) 10 each 6   cyclobenzaprine (FLEXERIL) 5 MG tablet Take 5 mg by mouth 2 (two) times daily as needed for muscle spasms.  Darbepoetin Alfa (ARANESP) 200 MCG/0.4ML SOSY injection Inject 0.4 mLs (200 mcg total) into the skin every Monday at 6 PM. (Patient taking differently: Inject 200 mcg into the skin every 7 (seven) days. Every Monday) 1.68 mL    diclofenac Sodium (VOLTAREN) 1 % GEL Apply 4 g topically 4 (four) times daily. (Patient taking differently: Apply 4 g topically daily as needed (for pain).) 100 g 0   escitalopram (LEXAPRO) 5 MG tablet Take 1 tablet (5 mg total) by mouth daily. 30 tablet 2    gabapentin (NEURONTIN) 100 MG capsule Take 100 mg by mouth at bedtime.     glucose blood test strip Use as instructed 100 each 12   hydrocortisone-pramoxine (PROCTOFOAM-HC) rectal foam Place 1 applicator rectally 2 (two) times daily. 10 g 1   insulin glargine (LANTUS SOLOSTAR) 100 UNIT/ML Solostar Pen Inject 15 Units into the skin at bedtime. 6 mL 4   Insulin Pen Needle (PENTIPS) 32G X 4 MM MISC Use as directed 100 each 3   lidocaine (LIDODERM) 5 % Place 1 patch onto the skin daily. Remove & Discard patch within 12 hours or as directed by MD (Patient taking differently: Place 1 patch onto the skin daily as needed (For pain).) 30 patch 0   LOKELMA 5 g packet Take 5 g by mouth daily.     multivitamin (RENA-VIT) TABS tablet Take 1 tablet by mouth daily. (Patient taking differently: Take 1 tablet by mouth See admin instructions. On Tuesday, Wednesday and Thursday) 30 tablet 0   ondansetron (ZOFRAN) 8 MG tablet Take 1 tablet (8 mg total) by mouth every 8 (eight) hours as needed for nausea or vomiting. 12 tablet 0   ondansetron (ZOFRAN-ODT) 8 MG disintegrating tablet Take 1 tablet (8 mg total) by mouth every 8 (eight) hours as needed for nausea or vomiting. 10 tablet 0   pantoprazole (PROTONIX) 40 MG tablet Take 1 tablet (40 mg total) by mouth 2 (two) times daily. 60 tablet 2   polyethylene glycol (MIRALAX / GLYCOLAX) 17 g packet Take 17 g by mouth 2 (two) times daily. 14 each 0   senna-docusate (SENOKOT-S) 8.6-50 MG tablet Take 1 tablet by mouth at bedtime. 30 tablet 2   Ubrogepant (UBRELVY) 50 MG TABS Take 1 tablet (50 mg total) by mouth as needed (migraine headache). if needed a second dose may be taken at least 2 hours after the initial dose; MAX 8 doses in a month 8 tablet 3   No current facility-administered medications on file prior to visit.    Family History  Problem Relation Age of Onset   Diabetes Mother    Diabetes Brother    Diabetes Daughter    Diabetes Daughter    Mental retardation  Brother        died from PNA   Diabetes Maternal Grandmother     Social History   Socioeconomic History   Marital status: Single    Spouse name: Not on file   Number of children: Not on file   Years of education: 10   Highest education level: Not on file  Occupational History    Employer: UNEMPLOYED  Tobacco Use   Smoking status: Former    Current packs/day: 0.00    Average packs/day: 0.1 packs/day for 10.0 years (1.0 ttl pk-yrs)    Types: Cigarettes    Start date: 11/17/2013    Quit date: 09/09/2014    Years since quitting: 8.4   Smokeless tobacco: Never  Vaping Use   Vaping  status: Never Used  Substance and Sexual Activity   Alcohol use: Not Currently   Drug use: Not Currently    Types: Marijuana    Comment: 2 times a monthy -  `last time [place] 2022   Sexual activity: Not Currently    Partners: Female, Female  Other Topics Concern   Not on file  Social History Narrative   Not on file   Social Determinants of Health   Financial Resource Strain: Low Risk  (10/19/2022)   Overall Financial Resource Strain (CARDIA)    Difficulty of Paying Living Expenses: Not hard at all  Food Insecurity: No Food Insecurity (02/15/2023)   Hunger Vital Sign    Worried About Running Out of Food in the Last Year: Never true    Ran Out of Food in the Last Year: Never true  Transportation Needs: No Transportation Needs (02/15/2023)   PRAPARE - Administrator, Civil Service (Medical): No    Lack of Transportation (Non-Medical): No  Recent Concern: Transportation Needs - Unmet Transportation Needs (11/30/2022)   [facility] from [facility]   PRAPARE - Transportation    Lack of Transportation (Medical): Not on file    Lack of Transportation (Non-Medical): Yes  Physical Activity: Inactive (10/19/2022)   Exercise Vital Sign    Days of Exercise per Week: 0 days    Minutes of Exercise per Session: 0 min  Stress:  Stress Concern Present (10/19/2022)   [employer] of Occupational Health - Occupational Stress Questionnaire    Feeling of Stress : Very much  Social Connections: Socially Isolated (10/19/2022)   Social Connection and Isolation Panel [NHANES]    Frequency of Communication with Friends and Family: More than three times a week    Frequency of Social Gatherings with Friends and Family: Never    Attends Religious Services: Never    Database administrator or Organizations: No    Attends Banker Meetings: Never    Marital Status: Never married  Intimate Partner Violence: Not At Risk (02/15/2023)   Humiliation, Afraid, Rape, and Kick questionnaire    Fear of Current or Ex-Partner: No    Emotionally Abused: No    Physically Abused: No    Sexually Abused: No    Review of Systems: ROS negative except for what is noted on the assessment and plan.  Vitals:   03/02/23 1004  BP: (!) 139/59  Pulse: 85  Temp: 98.4 F (36.9 C)  TempSrc: Oral  SpO2: 100%    Physical Exam: Constitutional: Pleasant woman, sitting in a wheel chair, NAD  Eyes: conjunctiva non-erythematous Cardiovascular: Normal heart sounds, no murmurs appreciated Pulmonary/Chest: normal work of breathing on room air, lungs clear to auscultation bilaterally MSK: Right BKA stump well dressed, no drainage at this time. Left BKA stump is well healed, no erythema or signs of infection at this time.     Assessment & Plan:   BKA stump complication [facility]) Kathy Frank is presenting to the clinic today for a hospital follow-up for which she was admitted and managed for hypertension.  Of note, she had a right BKA a couple of weeks ago and expresses concerns of postop pain at the stump site.  She said she was given oxycodone at the hospital but she had abdominal discomfort and was switched to Dilaudid and thinks Dilaudid helps better with the postop pain.  She rate the pain 8 out of 10 in the office today.  Right stump is  well-dressed no drainage at this time, stump well-healed no erythema warmth or signs of active infection at this time.  Will consider prescribing 10 days of 1 mg Dilaudid and follow-up with the patient after the course.If  pain does not seems to be easing down or improve, after 10 days I will consider reaching out to vascular surgery for further evaluation, and possibly consider phantom leg syndrome. Patient also expresses concerns that her old wheelchair does not have an adjustable arm which makes it difficult for her to be transferred to bed or to use the bathroom.  Will consider sending in a DME referral for a new wheelchair with adjustable arm. PLAN -Prescribe 1 mg of Dilaudid twice daily for 10 days - DME referral for a new wheel chair   Diabetes mellitus type 2 in obese History of diabetes currently being managed with insulin regimen.  Hemoglobin A1c in March was 6.5 and trended down to 6.1 today.  Patient endorses adherence to the insulin regimen however reports that she has not been able to pick up Victoza due to shortage in the Pharmacies in [place].  Reached out to [facility] and they do have Victoza at this time.  Will refer patient to [facility] to pick the medication. -Will recheck hemoglobin A1c in 3 months -   Constipation due to pain medication Patient reports having issues with bowel movement status post right BKA.  Says she tried MiraLAX but has not helped much.  Will consider prescribing Senna at this time . - Prescribe Senna 8.6-50 MG tablets  Panic attack Patient reports history of panic attack tried 5 mg Lexapro for the past 4 months but has not helped.  Says panic attack is been worse over the past 3 weeks especially when she is out in the public.  Patient is agreeable to try something else than the Lexapro at this time.  Consider prescribing 2.5 mg twice daily Buspar.  Renal dosing adequately factored into decision making . PLAN - Prescribe Buspar 2.5 BID     Patient seen with Dr. Cleda Daub   Kathleen Lime, M.D [facility] Internal Medicine Phone: [phone] Date 03/02/2023 Time 12:03 PM

## 2023-03-02 NOTE — Assessment & Plan Note (Signed)
Patient reports history of panic attack tried 5 mg Lexapro for the past 4 months but has not helped.  Says panic attack is been worse over the past 3 weeks especially when she is out in the public.  Patient is agreeable to try something else than the Lexapro at this time.  Consider prescribing 2.5 mg twice daily Buspar.  Renal dosing adequately factored into decision making . PLAN - Prescribe Buspar 2.5 BID

## 2023-03-02 NOTE — Assessment & Plan Note (Signed)
History of diabetes currently being managed with insulin regimen.  Hemoglobin A1c in March was 6.5 and trended down to 6.1 today.  Patient endorses adherence to the insulin regimen however reports that she has not been able to pick up Victoza due to shortage in the Pharmacies in [place].  Reached out to [facility] and they do have Victoza at this time.  Will refer patient to [facility] to pick the medication. -Will recheck hemoglobin A1c in 3 months -

## 2023-03-02 NOTE — Assessment & Plan Note (Signed)
Patient reports having issues with bowel movement status post right BKA.  Says she tried MiraLAX but has not helped much.  Will consider prescribing Senna at this time . - Prescribe Senna 8.6-50 MG tablets

## 2023-03-04 ENCOUNTER — Telehealth: Payer: Self-pay

## 2023-03-04 ENCOUNTER — Telehealth: Payer: Self-pay | Admitting: Dietician

## 2023-03-04 NOTE — Telephone Encounter (Signed)
Erin from [facility] called she is requesting veral orders for physical therapy with a frequency of 2w2, 1w6. Please return Erin's call [phone]

## 2023-03-04 NOTE — Telephone Encounter (Signed)
Patient stated in office this week she would like to use the Dexcom CGM and has sensor but cannot use her phone. She would like to use the [facility] G7 receiver and says she did not get one from pharmacy. [facility] pharmacy says a G7 deceiver was dispensed to hr on 06/30/22 and they are getting a denial/rejection on the current prescription. It can be filled after 03/31/23. Patient notified.

## 2023-03-04 NOTE — Telephone Encounter (Signed)
Returned call to [employer] from [facility]. Requesting VO for [facility] PT 2 week 2 and 1 week 6 to work on bed mobility, transfers, education on safety, and post-op meds. Verbal auth given. Will route to Red Team for agreement/denial.

## 2023-03-08 ENCOUNTER — Ambulatory Visit: Payer: Medicare Other | Admitting: Nurse Practitioner

## 2023-03-09 ENCOUNTER — Telehealth: Payer: Self-pay

## 2023-03-09 ENCOUNTER — Encounter (HOSPITAL_COMMUNITY): Payer: Self-pay

## 2023-03-09 NOTE — Telephone Encounter (Signed)
Pt called with c/o dark bloody "oozing" the last couple of days from her R AKA site. She denies any odor, pus, redness, fever. She has been keeping the area clean and changing dressing 1-2 daily. I have advised her to continue to monitor the area and to call us back if the "oozing" is not lessening each day or if anything changes/worsens. She has also been encouraged to elevate her leg to alleviate any swelling, as she mentioned she is noticing some swelling. Pt verbalized understanding of the above.

## 2023-03-10 ENCOUNTER — Telehealth: Payer: Self-pay | Admitting: Student

## 2023-03-10 ENCOUNTER — Other Ambulatory Visit (HOSPITAL_COMMUNITY): Payer: Self-pay

## 2023-03-10 DIAGNOSIS — Z89511 Acquired absence of right leg below knee: Secondary | ICD-10-CM

## 2023-03-10 NOTE — Telephone Encounter (Signed)
Pt called about her Wheelchair order and a shower chair.    The pt states she discussed the need for both DME requests.  Per insurance requirements both DME request will need to be mentioned in your office visit on 03/02/2023  in order for the patient's insurance to approve her request.  Is the patient needing a Standard or a Armed forces training and education officer?  A DME Referral order will need to be placed  instead of the ancillary order.

## 2023-03-10 NOTE — Telephone Encounter (Signed)
Per the patient she will not be able to keep [facility]  Patient has dialysis sch.  Pt states she will call back herself.  Pt also cancelled her  03[facility]01/2023 [facility] and stated she will call back.

## 2023-03-11 DIAGNOSIS — L299 Pruritus, unspecified: Secondary | ICD-10-CM | POA: Diagnosis not present

## 2023-03-11 DIAGNOSIS — E8770 Fluid overload, unspecified: Secondary | ICD-10-CM | POA: Diagnosis not present

## 2023-03-11 DIAGNOSIS — N2581 Secondary hyperparathyroidism of renal origin: Secondary | ICD-10-CM | POA: Diagnosis not present

## 2023-03-11 DIAGNOSIS — D631 Anemia in chronic kidney disease: Secondary | ICD-10-CM | POA: Diagnosis not present

## 2023-03-11 DIAGNOSIS — T8249XA Other complication of vascular dialysis catheter, initial encounter: Secondary | ICD-10-CM | POA: Diagnosis not present

## 2023-03-11 DIAGNOSIS — Z992 Dependence on renal dialysis: Secondary | ICD-10-CM | POA: Diagnosis not present

## 2023-03-11 DIAGNOSIS — D689 Coagulation defect, unspecified: Secondary | ICD-10-CM | POA: Diagnosis not present

## 2023-03-11 DIAGNOSIS — I120 Hypertensive chronic kidney disease with stage 5 chronic kidney disease or end stage renal disease: Secondary | ICD-10-CM | POA: Diagnosis not present

## 2023-03-11 DIAGNOSIS — R197 Diarrhea, unspecified: Secondary | ICD-10-CM | POA: Diagnosis not present

## 2023-03-11 DIAGNOSIS — N186 End stage renal disease: Secondary | ICD-10-CM | POA: Diagnosis not present

## 2023-03-11 NOTE — Progress Notes (Signed)
Internal Medicine Clinic Attending  I was physically present during the key portions of the resident provided service and participated in the medical decision making of patient's management care. I reviewed pertinent patient test results.  The assessment, diagnosis, and plan were formulated together and I agree with the documentation in the resident's note.  Gust Rung, DO

## 2023-03-11 NOTE — Addendum Note (Signed)
Addended by: Carlynn Purl C on: 03/11/2023 12:47 PM   Modules accepted: Level of Service

## 2023-03-13 DIAGNOSIS — T8249XA Other complication of vascular dialysis catheter, initial encounter: Secondary | ICD-10-CM | POA: Diagnosis not present

## 2023-03-13 DIAGNOSIS — D631 Anemia in chronic kidney disease: Secondary | ICD-10-CM | POA: Diagnosis not present

## 2023-03-13 DIAGNOSIS — R197 Diarrhea, unspecified: Secondary | ICD-10-CM | POA: Diagnosis not present

## 2023-03-13 DIAGNOSIS — N186 End stage renal disease: Secondary | ICD-10-CM | POA: Diagnosis not present

## 2023-03-13 DIAGNOSIS — E8770 Fluid overload, unspecified: Secondary | ICD-10-CM | POA: Diagnosis not present

## 2023-03-13 DIAGNOSIS — I120 Hypertensive chronic kidney disease with stage 5 chronic kidney disease or end stage renal disease: Secondary | ICD-10-CM | POA: Diagnosis not present

## 2023-03-13 DIAGNOSIS — L299 Pruritus, unspecified: Secondary | ICD-10-CM | POA: Diagnosis not present

## 2023-03-13 DIAGNOSIS — N2581 Secondary hyperparathyroidism of renal origin: Secondary | ICD-10-CM | POA: Diagnosis not present

## 2023-03-13 DIAGNOSIS — D689 Coagulation defect, unspecified: Secondary | ICD-10-CM | POA: Diagnosis not present

## 2023-03-13 DIAGNOSIS — Z992 Dependence on renal dialysis: Secondary | ICD-10-CM | POA: Diagnosis not present

## 2023-03-15 ENCOUNTER — Telehealth: Payer: Self-pay

## 2023-03-15 NOTE — Telephone Encounter (Signed)
Received call from [facility] with [facility] requesting letter stating pt is HIV positive and following up with our office for management. Letter and Vl faxed as requested.  P: [phone] F: [phone] Juanita Laster, RMA

## 2023-03-16 ENCOUNTER — Ambulatory Visit: Payer: Medicare Other

## 2023-03-16 DIAGNOSIS — Z992 Dependence on renal dialysis: Secondary | ICD-10-CM | POA: Diagnosis not present

## 2023-03-16 DIAGNOSIS — D631 Anemia in chronic kidney disease: Secondary | ICD-10-CM | POA: Diagnosis not present

## 2023-03-16 DIAGNOSIS — D689 Coagulation defect, unspecified: Secondary | ICD-10-CM | POA: Diagnosis not present

## 2023-03-16 DIAGNOSIS — N186 End stage renal disease: Secondary | ICD-10-CM | POA: Diagnosis not present

## 2023-03-16 DIAGNOSIS — E8770 Fluid overload, unspecified: Secondary | ICD-10-CM | POA: Diagnosis not present

## 2023-03-16 DIAGNOSIS — L299 Pruritus, unspecified: Secondary | ICD-10-CM | POA: Diagnosis not present

## 2023-03-16 DIAGNOSIS — N2581 Secondary hyperparathyroidism of renal origin: Secondary | ICD-10-CM | POA: Diagnosis not present

## 2023-03-16 DIAGNOSIS — T8249XA Other complication of vascular dialysis catheter, initial encounter: Secondary | ICD-10-CM | POA: Diagnosis not present

## 2023-03-16 DIAGNOSIS — I120 Hypertensive chronic kidney disease with stage 5 chronic kidney disease or end stage renal disease: Secondary | ICD-10-CM | POA: Diagnosis not present

## 2023-03-16 DIAGNOSIS — R197 Diarrhea, unspecified: Secondary | ICD-10-CM | POA: Diagnosis not present

## 2023-03-17 DIAGNOSIS — E8770 Fluid overload, unspecified: Secondary | ICD-10-CM | POA: Diagnosis not present

## 2023-03-17 DIAGNOSIS — T8249XA Other complication of vascular dialysis catheter, initial encounter: Secondary | ICD-10-CM | POA: Diagnosis not present

## 2023-03-17 DIAGNOSIS — I120 Hypertensive chronic kidney disease with stage 5 chronic kidney disease or end stage renal disease: Secondary | ICD-10-CM | POA: Diagnosis not present

## 2023-03-17 DIAGNOSIS — N186 End stage renal disease: Secondary | ICD-10-CM | POA: Diagnosis not present

## 2023-03-17 DIAGNOSIS — L299 Pruritus, unspecified: Secondary | ICD-10-CM | POA: Diagnosis not present

## 2023-03-17 DIAGNOSIS — D689 Coagulation defect, unspecified: Secondary | ICD-10-CM | POA: Diagnosis not present

## 2023-03-17 DIAGNOSIS — R197 Diarrhea, unspecified: Secondary | ICD-10-CM | POA: Diagnosis not present

## 2023-03-17 DIAGNOSIS — Z992 Dependence on renal dialysis: Secondary | ICD-10-CM | POA: Diagnosis not present

## 2023-03-17 DIAGNOSIS — N2581 Secondary hyperparathyroidism of renal origin: Secondary | ICD-10-CM | POA: Diagnosis not present

## 2023-03-17 DIAGNOSIS — D631 Anemia in chronic kidney disease: Secondary | ICD-10-CM | POA: Diagnosis not present

## 2023-03-18 DIAGNOSIS — D689 Coagulation defect, unspecified: Secondary | ICD-10-CM | POA: Diagnosis not present

## 2023-03-18 DIAGNOSIS — R197 Diarrhea, unspecified: Secondary | ICD-10-CM | POA: Diagnosis not present

## 2023-03-18 DIAGNOSIS — L299 Pruritus, unspecified: Secondary | ICD-10-CM | POA: Diagnosis not present

## 2023-03-18 DIAGNOSIS — T8249XA Other complication of vascular dialysis catheter, initial encounter: Secondary | ICD-10-CM | POA: Diagnosis not present

## 2023-03-18 DIAGNOSIS — Z992 Dependence on renal dialysis: Secondary | ICD-10-CM | POA: Diagnosis not present

## 2023-03-18 DIAGNOSIS — N2581 Secondary hyperparathyroidism of renal origin: Secondary | ICD-10-CM | POA: Diagnosis not present

## 2023-03-18 DIAGNOSIS — E8770 Fluid overload, unspecified: Secondary | ICD-10-CM | POA: Diagnosis not present

## 2023-03-18 DIAGNOSIS — I120 Hypertensive chronic kidney disease with stage 5 chronic kidney disease or end stage renal disease: Secondary | ICD-10-CM | POA: Diagnosis not present

## 2023-03-18 DIAGNOSIS — N186 End stage renal disease: Secondary | ICD-10-CM | POA: Diagnosis not present

## 2023-03-18 DIAGNOSIS — D631 Anemia in chronic kidney disease: Secondary | ICD-10-CM | POA: Diagnosis not present

## 2023-03-19 ENCOUNTER — Ambulatory Visit: Payer: Self-pay

## 2023-03-19 NOTE — Patient Outreach (Signed)
  Care Coordination   Follow Up Visit Note   03/19/2023 Name: Kathy Frank MRN: [id number] DOB: 12/10/1975  Kathy Frank is a 47 y.o. year old female who sees Tawkaliyar, Roya, DO for primary care. I spoke with  [name] by phone today.  What matters to the patients health and wellness today?  AKA: physical therapy is going well. They are coming out to show her how to move her leg and strengthen her upper body.  Change in pain medication: She was able to get her medication changed to Dilaudid and no longer takes oxycodone because of how it made her sick.  Diabetes: Her blood sugar has been no higher than 160. She wants to get her Dexcom back but needs the reader but will not be able to until the 21st of the month with Medicaid. She is not eating much and drinks diet soda and water. She has a fluid restriction that she is trying to stick to.      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 and patient's adherence to plan as established by provider Advised patient to follow protocol established by the physician Reviewed medications with patient and discussed pain medication and advised her to talk with her PCP about the medication and what to do Reviewed scheduled/upcoming provider appointments including [facility] on 03/02/23 -Patient/Caregiver will self-administer medications as prescribed as evidenced by self-report/primary caregiver report  -Patient/Caregiver will attend all scheduled provider appointments as evidenced by clinician review of documented attendance to scheduled appointments and patient/caregiver report -Patient/Caregiver will call pharmacy for medication refills as evidenced by patient report and review of pharmacy fill history as appropriate -Patient/Caregiver will call provider office for new concerns or questions as  evidenced by review of documented incoming telephone call notes and patient report  Continue with physical therapy to get stronger           SDOH assessments and interventions completed:  No     Care Coordination Interventions:  Yes, provided   Interventions Today    Flowsheet Row Most Recent Value  Chronic Disease   Chronic disease during today's visit Other, Diabetes  [AKA]  General Interventions   General Interventions Discussed/Reviewed General Interventions Discussed, General Interventions Reviewed  Nutrition Interventions   Nutrition Discussed/Reviewed Fluid intake  Pharmacy Interventions   Pharmacy Dicussed/Reviewed Pharmacy Topics Discussed, Pharmacy Topics Reviewed  Safety Interventions   Safety Discussed/Reviewed Safety Reviewed        Follow up plan: Follow up call scheduled for 04/21/23 11 am    Encounter Outcome:  Pt. Visit Completed   Juanell Fairly RN, BSN, [facility] Care Coordinator [facility]   Phone: [phone]

## 2023-03-19 NOTE — Patient Instructions (Signed)
Visit Information  Thank you for taking time to visit with me today. Please don't hesitate to contact me if I can be of assistance to you.   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 and patient's adherence to plan as established by provider Advised patient to follow protocol established by the physician Reviewed medications with patient and discussed pain medication and advised her to talk with her PCP about the medication and what to do Reviewed scheduled/upcoming provider appointments including [facility] on 03/02/23 -Patient/Caregiver will self-administer medications as prescribed as evidenced by self-report/primary caregiver report  -Patient/Caregiver will attend all scheduled provider appointments as evidenced by clinician review of documented attendance to scheduled appointments and patient/caregiver report -Patient/Caregiver will call pharmacy for medication refills as evidenced by patient report and review of pharmacy fill history as appropriate -Patient/Caregiver will call provider office for new concerns or questions as evidenced by review of documented incoming telephone call notes and patient report  Continue with physical therapy to get stronger           Our next appointment is by telephone on 04/21/23 at 11 am  Please call the care guide team at [phone] if you need to cancel or reschedule your appointment.   If you are experiencing a Mental Health or Behavioral Health Crisis or need someone to talk to, please call 1-800-273-TALK (toll free, 24 hour hotline)  The patient verbalized understanding of instructions, educational materials, and care plan provided today.    Juanell Fairly RN, BSN, [facility] Care Coordinator [facility]   Phone: [phone]

## 2023-03-20 DIAGNOSIS — N2581 Secondary hyperparathyroidism of renal origin: Secondary | ICD-10-CM | POA: Diagnosis not present

## 2023-03-20 DIAGNOSIS — Z992 Dependence on renal dialysis: Secondary | ICD-10-CM | POA: Diagnosis not present

## 2023-03-20 DIAGNOSIS — T8249XA Other complication of vascular dialysis catheter, initial encounter: Secondary | ICD-10-CM | POA: Diagnosis not present

## 2023-03-20 DIAGNOSIS — D689 Coagulation defect, unspecified: Secondary | ICD-10-CM | POA: Diagnosis not present

## 2023-03-20 DIAGNOSIS — I120 Hypertensive chronic kidney disease with stage 5 chronic kidney disease or end stage renal disease: Secondary | ICD-10-CM | POA: Diagnosis not present

## 2023-03-20 DIAGNOSIS — R197 Diarrhea, unspecified: Secondary | ICD-10-CM | POA: Diagnosis not present

## 2023-03-20 DIAGNOSIS — N186 End stage renal disease: Secondary | ICD-10-CM | POA: Diagnosis not present

## 2023-03-20 DIAGNOSIS — D631 Anemia in chronic kidney disease: Secondary | ICD-10-CM | POA: Diagnosis not present

## 2023-03-20 DIAGNOSIS — E8770 Fluid overload, unspecified: Secondary | ICD-10-CM | POA: Diagnosis not present

## 2023-03-20 DIAGNOSIS — L299 Pruritus, unspecified: Secondary | ICD-10-CM | POA: Diagnosis not present

## 2023-03-22 ENCOUNTER — Telehealth: Payer: Self-pay

## 2023-03-22 ENCOUNTER — Encounter (HOSPITAL_COMMUNITY): Payer: Self-pay

## 2023-03-22 DIAGNOSIS — Z992 Dependence on renal dialysis: Secondary | ICD-10-CM | POA: Diagnosis not present

## 2023-03-22 DIAGNOSIS — E440 Moderate protein-calorie malnutrition: Secondary | ICD-10-CM | POA: Diagnosis not present

## 2023-03-22 DIAGNOSIS — B965 Pseudomonas (aeruginosa) (mallei) (pseudomallei) as the cause of diseases classified elsewhere: Secondary | ICD-10-CM | POA: Diagnosis not present

## 2023-03-22 DIAGNOSIS — N186 End stage renal disease: Secondary | ICD-10-CM

## 2023-03-22 DIAGNOSIS — Z794 Long term (current) use of insulin: Secondary | ICD-10-CM | POA: Diagnosis not present

## 2023-03-22 DIAGNOSIS — K3184 Gastroparesis: Secondary | ICD-10-CM | POA: Diagnosis not present

## 2023-03-22 DIAGNOSIS — E1169 Type 2 diabetes mellitus with other specified complication: Secondary | ICD-10-CM | POA: Diagnosis not present

## 2023-03-22 DIAGNOSIS — E113393 Type 2 diabetes mellitus with moderate nonproliferative diabetic retinopathy without macular edema, bilateral: Secondary | ICD-10-CM | POA: Diagnosis not present

## 2023-03-22 DIAGNOSIS — Z89611 Acquired absence of right leg above knee: Secondary | ICD-10-CM | POA: Diagnosis not present

## 2023-03-22 DIAGNOSIS — D631 Anemia in chronic kidney disease: Secondary | ICD-10-CM | POA: Diagnosis not present

## 2023-03-22 DIAGNOSIS — T8743 Infection of amputation stump, right lower extremity: Secondary | ICD-10-CM | POA: Diagnosis not present

## 2023-03-22 DIAGNOSIS — G43109 Migraine with aura, not intractable, without status migrainosus: Secondary | ICD-10-CM | POA: Diagnosis not present

## 2023-03-22 DIAGNOSIS — I700 Atherosclerosis of aorta: Secondary | ICD-10-CM | POA: Diagnosis not present

## 2023-03-22 DIAGNOSIS — I120 Hypertensive chronic kidney disease with stage 5 chronic kidney disease or end stage renal disease: Secondary | ICD-10-CM | POA: Diagnosis not present

## 2023-03-22 DIAGNOSIS — N2581 Secondary hyperparathyroidism of renal origin: Secondary | ICD-10-CM | POA: Diagnosis not present

## 2023-03-22 DIAGNOSIS — E1122 Type 2 diabetes mellitus with diabetic chronic kidney disease: Secondary | ICD-10-CM | POA: Diagnosis not present

## 2023-03-22 DIAGNOSIS — I2510 Atherosclerotic heart disease of native coronary artery without angina pectoris: Secondary | ICD-10-CM | POA: Diagnosis not present

## 2023-03-22 DIAGNOSIS — K250 Acute gastric ulcer with hemorrhage: Secondary | ICD-10-CM | POA: Diagnosis not present

## 2023-03-22 DIAGNOSIS — Z89612 Acquired absence of left leg above knee: Secondary | ICD-10-CM | POA: Diagnosis not present

## 2023-03-22 DIAGNOSIS — E1143 Type 2 diabetes mellitus with diabetic autonomic (poly)neuropathy: Secondary | ICD-10-CM | POA: Diagnosis not present

## 2023-03-22 DIAGNOSIS — E1151 Type 2 diabetes mellitus with diabetic peripheral angiopathy without gangrene: Secondary | ICD-10-CM | POA: Diagnosis not present

## 2023-03-22 NOTE — Telephone Encounter (Signed)
Kelle Darting, PT with [facility] called asking if they could remove the staples in her BKA conversion to AKA since her appt would be beyond the 6 wk mark. Pt has an appt on 8/19 for HD access issues.  Spoke to [place], [place] who advised that the 8/19 appts be moved to 8/28 when Dr. Randie Heinz would be in the office. The timeframe is acceptable for the staples. So she could have both issues addressed in the same visit.  Called pt, two identifiers. Informed her of the consolidation of appts. Gave her the new appt times. Confirmed understanding.

## 2023-03-22 NOTE — Addendum Note (Signed)
Addended by: Derrek Monaco on: 03/22/2023 01:36 PM   Modules accepted: Orders

## 2023-03-22 NOTE — Progress Notes (Signed)
Patient recently had a right below the knee amputation and has significant pain at her stump site. Her mobility is significantly limited. She will require a standard wheelchair for ambulation and a shower chair for bathing.

## 2023-03-22 NOTE — Telephone Encounter (Signed)
F/U with the current order that have been placed.  I have just spoken with Erin A. Physical Therapist From [facility]  The pt is a double amputee and will require a  Drop Down or a Swing Back Wheelchair which is not a Statistician.  [facility] does the Wheelchair assessments.  Can Neuro rehab Referral be placed instead as the pt is willing to go for the assessment?  The DME Order for the Shower chair needs to be a Advertising account planner.  Can a new DME order be placed for this as well.

## 2023-03-23 ENCOUNTER — Other Ambulatory Visit: Payer: Self-pay

## 2023-03-23 DIAGNOSIS — D689 Coagulation defect, unspecified: Secondary | ICD-10-CM | POA: Diagnosis not present

## 2023-03-23 DIAGNOSIS — Z992 Dependence on renal dialysis: Secondary | ICD-10-CM | POA: Diagnosis not present

## 2023-03-23 DIAGNOSIS — T8249XA Other complication of vascular dialysis catheter, initial encounter: Secondary | ICD-10-CM | POA: Diagnosis not present

## 2023-03-23 DIAGNOSIS — I120 Hypertensive chronic kidney disease with stage 5 chronic kidney disease or end stage renal disease: Secondary | ICD-10-CM | POA: Diagnosis not present

## 2023-03-23 DIAGNOSIS — R197 Diarrhea, unspecified: Secondary | ICD-10-CM | POA: Diagnosis not present

## 2023-03-23 DIAGNOSIS — D631 Anemia in chronic kidney disease: Secondary | ICD-10-CM | POA: Diagnosis not present

## 2023-03-23 DIAGNOSIS — L299 Pruritus, unspecified: Secondary | ICD-10-CM | POA: Diagnosis not present

## 2023-03-23 DIAGNOSIS — E8770 Fluid overload, unspecified: Secondary | ICD-10-CM | POA: Diagnosis not present

## 2023-03-23 DIAGNOSIS — N2581 Secondary hyperparathyroidism of renal origin: Secondary | ICD-10-CM | POA: Diagnosis not present

## 2023-03-23 DIAGNOSIS — N186 End stage renal disease: Secondary | ICD-10-CM | POA: Diagnosis not present

## 2023-03-24 DIAGNOSIS — Z89511 Acquired absence of right leg below knee: Secondary | ICD-10-CM | POA: Diagnosis not present

## 2023-03-25 DIAGNOSIS — R197 Diarrhea, unspecified: Secondary | ICD-10-CM | POA: Diagnosis not present

## 2023-03-25 DIAGNOSIS — E8770 Fluid overload, unspecified: Secondary | ICD-10-CM | POA: Diagnosis not present

## 2023-03-25 DIAGNOSIS — N186 End stage renal disease: Secondary | ICD-10-CM | POA: Diagnosis not present

## 2023-03-25 DIAGNOSIS — T8249XA Other complication of vascular dialysis catheter, initial encounter: Secondary | ICD-10-CM | POA: Diagnosis not present

## 2023-03-25 DIAGNOSIS — L299 Pruritus, unspecified: Secondary | ICD-10-CM | POA: Diagnosis not present

## 2023-03-25 DIAGNOSIS — I120 Hypertensive chronic kidney disease with stage 5 chronic kidney disease or end stage renal disease: Secondary | ICD-10-CM | POA: Diagnosis not present

## 2023-03-25 DIAGNOSIS — N2581 Secondary hyperparathyroidism of renal origin: Secondary | ICD-10-CM | POA: Diagnosis not present

## 2023-03-25 DIAGNOSIS — D689 Coagulation defect, unspecified: Secondary | ICD-10-CM | POA: Diagnosis not present

## 2023-03-25 DIAGNOSIS — Z992 Dependence on renal dialysis: Secondary | ICD-10-CM | POA: Diagnosis not present

## 2023-03-25 DIAGNOSIS — D631 Anemia in chronic kidney disease: Secondary | ICD-10-CM | POA: Diagnosis not present

## 2023-03-27 DIAGNOSIS — T8249XA Other complication of vascular dialysis catheter, initial encounter: Secondary | ICD-10-CM | POA: Diagnosis not present

## 2023-03-27 DIAGNOSIS — I120 Hypertensive chronic kidney disease with stage 5 chronic kidney disease or end stage renal disease: Secondary | ICD-10-CM | POA: Diagnosis not present

## 2023-03-27 DIAGNOSIS — L299 Pruritus, unspecified: Secondary | ICD-10-CM | POA: Diagnosis not present

## 2023-03-27 DIAGNOSIS — D631 Anemia in chronic kidney disease: Secondary | ICD-10-CM | POA: Diagnosis not present

## 2023-03-27 DIAGNOSIS — D689 Coagulation defect, unspecified: Secondary | ICD-10-CM | POA: Diagnosis not present

## 2023-03-27 DIAGNOSIS — N186 End stage renal disease: Secondary | ICD-10-CM | POA: Diagnosis not present

## 2023-03-27 DIAGNOSIS — R197 Diarrhea, unspecified: Secondary | ICD-10-CM | POA: Diagnosis not present

## 2023-03-27 DIAGNOSIS — N2581 Secondary hyperparathyroidism of renal origin: Secondary | ICD-10-CM | POA: Diagnosis not present

## 2023-03-27 DIAGNOSIS — E8770 Fluid overload, unspecified: Secondary | ICD-10-CM | POA: Diagnosis not present

## 2023-03-27 DIAGNOSIS — Z992 Dependence on renal dialysis: Secondary | ICD-10-CM | POA: Diagnosis not present

## 2023-03-29 ENCOUNTER — Ambulatory Visit (HOSPITAL_COMMUNITY): Payer: 59

## 2023-03-29 ENCOUNTER — Ambulatory Visit: Payer: Medicare Other

## 2023-03-30 ENCOUNTER — Encounter: Payer: 59 | Admitting: Dietician

## 2023-03-30 DIAGNOSIS — Z992 Dependence on renal dialysis: Secondary | ICD-10-CM | POA: Diagnosis not present

## 2023-03-30 DIAGNOSIS — D689 Coagulation defect, unspecified: Secondary | ICD-10-CM | POA: Diagnosis not present

## 2023-03-30 DIAGNOSIS — T8249XA Other complication of vascular dialysis catheter, initial encounter: Secondary | ICD-10-CM | POA: Diagnosis not present

## 2023-03-30 DIAGNOSIS — D631 Anemia in chronic kidney disease: Secondary | ICD-10-CM | POA: Diagnosis not present

## 2023-03-30 DIAGNOSIS — R197 Diarrhea, unspecified: Secondary | ICD-10-CM | POA: Diagnosis not present

## 2023-03-30 DIAGNOSIS — E8770 Fluid overload, unspecified: Secondary | ICD-10-CM | POA: Diagnosis not present

## 2023-03-30 DIAGNOSIS — L299 Pruritus, unspecified: Secondary | ICD-10-CM | POA: Diagnosis not present

## 2023-03-30 DIAGNOSIS — N186 End stage renal disease: Secondary | ICD-10-CM | POA: Diagnosis not present

## 2023-03-30 DIAGNOSIS — I120 Hypertensive chronic kidney disease with stage 5 chronic kidney disease or end stage renal disease: Secondary | ICD-10-CM | POA: Diagnosis not present

## 2023-03-30 DIAGNOSIS — N2581 Secondary hyperparathyroidism of renal origin: Secondary | ICD-10-CM | POA: Diagnosis not present

## 2023-04-01 ENCOUNTER — Telehealth: Payer: Self-pay

## 2023-04-01 DIAGNOSIS — Z992 Dependence on renal dialysis: Secondary | ICD-10-CM | POA: Diagnosis not present

## 2023-04-01 DIAGNOSIS — N2581 Secondary hyperparathyroidism of renal origin: Secondary | ICD-10-CM | POA: Diagnosis not present

## 2023-04-01 DIAGNOSIS — D689 Coagulation defect, unspecified: Secondary | ICD-10-CM | POA: Diagnosis not present

## 2023-04-01 DIAGNOSIS — D631 Anemia in chronic kidney disease: Secondary | ICD-10-CM | POA: Diagnosis not present

## 2023-04-01 DIAGNOSIS — R197 Diarrhea, unspecified: Secondary | ICD-10-CM | POA: Diagnosis not present

## 2023-04-01 DIAGNOSIS — E8770 Fluid overload, unspecified: Secondary | ICD-10-CM | POA: Diagnosis not present

## 2023-04-01 DIAGNOSIS — I120 Hypertensive chronic kidney disease with stage 5 chronic kidney disease or end stage renal disease: Secondary | ICD-10-CM | POA: Diagnosis not present

## 2023-04-01 DIAGNOSIS — L299 Pruritus, unspecified: Secondary | ICD-10-CM | POA: Diagnosis not present

## 2023-04-01 DIAGNOSIS — N186 End stage renal disease: Secondary | ICD-10-CM | POA: Diagnosis not present

## 2023-04-01 DIAGNOSIS — T8249XA Other complication of vascular dialysis catheter, initial encounter: Secondary | ICD-10-CM | POA: Diagnosis not present

## 2023-04-01 NOTE — Telephone Encounter (Signed)
Pt called stating she fell last night and her staples are intact but an area of stump is bleeding a little. She states the area looks and feels okay otherwise. She has been advised to clean the area and protect it and keep it clean and dry with gauze/tape. If the bleeding persists or anything changes/worsens, she will call us back. Pt verbalized understanding of the above.

## 2023-04-02 ENCOUNTER — Ambulatory Visit (INDEPENDENT_AMBULATORY_CARE_PROVIDER_SITE_OTHER): Payer: 59 | Admitting: Physician Assistant

## 2023-04-02 ENCOUNTER — Telehealth: Payer: Self-pay

## 2023-04-02 VITALS — BP 190/89 | HR 96 | Temp 98.0°F

## 2023-04-02 DIAGNOSIS — I739 Peripheral vascular disease, unspecified: Secondary | ICD-10-CM

## 2023-04-02 NOTE — Telephone Encounter (Signed)
Pt called again today stating her AKA stump is looking worse and tender. She fell on it the other day and at HD today they told her it looked like it needed to get assessed. Pt has been added on to APP schedule today and is aware of this appt.

## 2023-04-02 NOTE — Progress Notes (Signed)
POST OPERATIVE OFFICE NOTE    CC:  F/u for surgery  HPI:  This is a 47 y.o. 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 Randie Heinz.    Pt returns today for follow up.  Pt states she was trying to sweep and feel out of her WC landing on the lateral portion of her right AKA.  She denies sever pain or ecchymosis.     No Known Allergies  Current Outpatient Medications  Medication Sig Dispense Refill   Accu-Chek Softclix Lancets lancets Use as instructed 100 each 12   acetaminophen (TYLENOL) 500 MG tablet Take 2 tablets (1,000 mg total) by mouth every 6 (six) hours. 30 tablet 0   albuterol (VENTOLIN HFA) 108 (90 Base) MCG/ACT inhaler Inhale 2 puffs into the lungs every 4 (four) hours as needed for wheezing or shortness of breath. 8.5 g 0   amLODipine-olmesartan (AZOR) 10-40 MG tablet Take 1 tablet by mouth every evening.     amLODipine-olmesartan (AZOR) 5-20 MG tablet Take 1 tablet by mouth daily. 30 tablet 0   aspirin EC (EQ ASPIRIN ADULT LOW DOSE) 81 MG tablet Take 1 tablet (81 mg total) by mouth daily. Swallow whole. 30 tablet 3   atorvastatin (LIPITOR) 80 MG tablet Take 1 tablet (80 mg total) by mouth daily. 30 tablet 11   bictegravir-emtricitabine-tenofovir AF (BIKTARVY) 50-200-25 MG TABS tablet Take 1 tablet by mouth daily. OVERDUE FOR OFFICE FOLLOW UP - PLEASE CALL OUR OFFICE TO SCHEDULE [phone] 30 tablet 5   bisacodyl (DULCOLAX) 10 MG suppository Place 1 suppository (10 mg total) rectally as needed for moderate constipation. 12 suppository 0   calcitRIOL (ROCALTROL) 0.25 MCG capsule Take 5 capsules (1.25 mcg total) by mouth Every Tuesday,Thursday,and Saturday with dialysis. 60 capsule 0   calcium acetate (PHOSLO) 667 MG capsule Take 2 capsules (1,334 mg total) by mouth 3 (three) times daily with meals. 180 capsule 0   Continuous Blood Gluc Sensor (DEXCOM G7 SENSOR) MISC Use to check blood sugar continuously 10 each 6   Continuous Glucose Receiver (DEXCOM G7 RECEIVER) DEVI  One box 1 each 0   cyclobenzaprine (FLEXERIL) 5 MG tablet Take 5 mg by mouth 2 (two) times daily as needed for muscle spasms.     Darbepoetin Alfa (ARANESP) 200 MCG/0.4ML SOSY injection Inject 0.4 mLs (200 mcg total) into the skin every Monday at 6 PM. (Patient taking differently: Inject 200 mcg into the skin every 7 (seven) days. Every Monday) 1.68 mL    diclofenac Sodium (VOLTAREN) 1 % GEL Apply 4 g topically 4 (four) times daily. (Patient taking differently: Apply 4 g topically daily as needed (for pain).) 100 g 0   escitalopram (LEXAPRO) 5 MG tablet Take 1 tablet (5 mg total) by mouth daily. 30 tablet 2   gabapentin (NEURONTIN) 100 MG capsule Take 100 mg by mouth at bedtime.     glucose blood test strip Use as instructed 100 each 12   hydrocortisone-pramoxine (PROCTOFOAM-HC) rectal foam Place 1 applicator rectally 2 (two) times daily. 10 g 1   insulin glargine (LANTUS SOLOSTAR) 100 UNIT/ML Solostar Pen Inject 15 Units into the skin at bedtime. 6 mL 4   Insulin Pen Needle (PENTIPS) 32G X 4 MM MISC Use as directed 100 each 3   lidocaine (LIDODERM) 5 % Place 1 patch onto the skin daily. Remove & Discard patch within 12 hours or as directed by MD (Patient taking differently: Place 1 patch onto the skin daily as needed (For  pain).) 30 patch 0   liraglutide (VICTOZA) 18 MG/3ML SOPN Inject 0.6 mg into the skin daily. 3 mL 11   LOKELMA 5 g packet Take 5 g by mouth daily.     multivitamin (RENA-VIT) TABS tablet Take 1 tablet by mouth daily. (Patient taking differently: Take 1 tablet by mouth See admin instructions. On Tuesday, Wednesday and Thursday) 30 tablet 0   ondansetron (ZOFRAN) 8 MG tablet Take 1 tablet (8 mg total) by mouth every 8 (eight) hours as needed for nausea or vomiting. 12 tablet 0   ondansetron (ZOFRAN-ODT) 8 MG disintegrating tablet Take 1 tablet (8 mg total) by mouth every 8 (eight) hours as needed for nausea or vomiting. 10 tablet 0   pantoprazole (PROTONIX) 40 MG tablet Take 1 tablet  (40 mg total) by mouth 2 (two) times daily. 60 tablet 2   polyethylene glycol (MIRALAX / GLYCOLAX) 17 g packet Take 17 g by mouth 2 (two) times daily. 14 each 0   senna (SENOKOT) 8.6 MG TABS tablet Take 1 tablet (8.6 mg total) by mouth daily as needed for mild constipation. 120 tablet 0   senna-docusate (SENOKOT-S) 8.6-50 MG tablet Take 1 tablet by mouth at bedtime. 30 tablet 2   Ubrogepant (UBRELVY) 50 MG TABS Take 1 tablet (50 mg total) by mouth as needed (migraine headache). if needed a second dose may be taken at least 2 hours after the initial dose; MAX 8 doses in a month 8 tablet 3   Current Facility-Administered Medications  Medication Dose Route Frequency Provider Last Rate Last Admin   busPIRone (BUSPAR) tablet 2.5 mg  2.5 mg Oral BID          ROS:  See HPI  Physical Exam:    No palpable hematoma, skin soft, no purulent drainage The stump was cleaned and a dry dressing was placed over the lateral area of minimal drainage.     Assessment/Plan:  This is a 47 y.o. female who is s/p:Right AKA s/p fall No evidence of infection or hematoma.  She has a f/u in 1 week for exam and staple removal.  If she develops new symptoms she will call other wise her stump appears viable.       Mosetta Pigeon PA-C [facility] [phone]   Clinic MD:  Edilia Bo

## 2023-04-03 DIAGNOSIS — N2581 Secondary hyperparathyroidism of renal origin: Secondary | ICD-10-CM | POA: Diagnosis not present

## 2023-04-03 DIAGNOSIS — D689 Coagulation defect, unspecified: Secondary | ICD-10-CM | POA: Diagnosis not present

## 2023-04-03 DIAGNOSIS — Z992 Dependence on renal dialysis: Secondary | ICD-10-CM | POA: Diagnosis not present

## 2023-04-03 DIAGNOSIS — I120 Hypertensive chronic kidney disease with stage 5 chronic kidney disease or end stage renal disease: Secondary | ICD-10-CM | POA: Diagnosis not present

## 2023-04-03 DIAGNOSIS — N186 End stage renal disease: Secondary | ICD-10-CM | POA: Diagnosis not present

## 2023-04-03 DIAGNOSIS — R197 Diarrhea, unspecified: Secondary | ICD-10-CM | POA: Diagnosis not present

## 2023-04-03 DIAGNOSIS — D631 Anemia in chronic kidney disease: Secondary | ICD-10-CM | POA: Diagnosis not present

## 2023-04-03 DIAGNOSIS — T8249XA Other complication of vascular dialysis catheter, initial encounter: Secondary | ICD-10-CM | POA: Diagnosis not present

## 2023-04-03 DIAGNOSIS — L299 Pruritus, unspecified: Secondary | ICD-10-CM | POA: Diagnosis not present

## 2023-04-03 DIAGNOSIS — E8770 Fluid overload, unspecified: Secondary | ICD-10-CM | POA: Diagnosis not present

## 2023-04-05 ENCOUNTER — Encounter (HOSPITAL_COMMUNITY): Payer: Self-pay

## 2023-04-05 ENCOUNTER — Other Ambulatory Visit (HOSPITAL_COMMUNITY): Payer: Self-pay

## 2023-04-06 ENCOUNTER — Ambulatory Visit (INDEPENDENT_AMBULATORY_CARE_PROVIDER_SITE_OTHER): Payer: 59 | Admitting: Student

## 2023-04-06 ENCOUNTER — Telehealth: Payer: Self-pay

## 2023-04-06 DIAGNOSIS — T8249XA Other complication of vascular dialysis catheter, initial encounter: Secondary | ICD-10-CM | POA: Diagnosis not present

## 2023-04-06 DIAGNOSIS — Z992 Dependence on renal dialysis: Secondary | ICD-10-CM | POA: Diagnosis not present

## 2023-04-06 DIAGNOSIS — N2581 Secondary hyperparathyroidism of renal origin: Secondary | ICD-10-CM | POA: Diagnosis not present

## 2023-04-06 DIAGNOSIS — E8770 Fluid overload, unspecified: Secondary | ICD-10-CM | POA: Diagnosis not present

## 2023-04-06 DIAGNOSIS — L299 Pruritus, unspecified: Secondary | ICD-10-CM | POA: Diagnosis not present

## 2023-04-06 DIAGNOSIS — I120 Hypertensive chronic kidney disease with stage 5 chronic kidney disease or end stage renal disease: Secondary | ICD-10-CM | POA: Diagnosis not present

## 2023-04-06 DIAGNOSIS — R197 Diarrhea, unspecified: Secondary | ICD-10-CM | POA: Diagnosis not present

## 2023-04-06 DIAGNOSIS — N186 End stage renal disease: Secondary | ICD-10-CM | POA: Diagnosis not present

## 2023-04-06 DIAGNOSIS — D631 Anemia in chronic kidney disease: Secondary | ICD-10-CM | POA: Diagnosis not present

## 2023-04-06 DIAGNOSIS — Z89611 Acquired absence of right leg above knee: Secondary | ICD-10-CM

## 2023-04-06 DIAGNOSIS — D689 Coagulation defect, unspecified: Secondary | ICD-10-CM | POA: Diagnosis not present

## 2023-04-06 MED ORDER — LIDOCAINE 5 % EX PTCH: 1.0000 | MEDICATED_PATCH | CUTANEOUS | 0 refills | Status: DC

## 2023-04-06 NOTE — Assessment & Plan Note (Signed)
Patient calls in concerned about pain in her stump on the right side.  She was recently admitted for osteomyelitis and her BKA was then transitioned to a AKA.  She had a fall last week on that stump (6 days ago) and was told by her vascular clinic to ask her primary care for pain management.  She denies any discharge from the wound, and he has since, fevers, chills.  She was previously prescribed Dilaudid after her procedures for pain management, and patient was asking if this can be prescribed again.  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  Overall with soreness and known recent trauma, will treat conservatively with lidocaine patches and patient is agreeable.  I have spent 11 minutes discussing her medical problems on the phone with her.

## 2023-04-06 NOTE — Progress Notes (Signed)
   CC: Right lower extremity pain  This is a telephone encounter between [employer] and Miku Udall on 04/07/2023 for right lower extremity pain. The visit was conducted with the patient located at home and [place] at [facility] [address] The patient's identity was confirmed using their DOB and current address. The patient has consented to being evaluated through a telephone encounter and understands the associated risks (an examination cannot be done and the patient may need to come in for an appointment) / benefits (allows the patient to remain at home, decreasing exposure to coronavirus). I personally spent 15 minutes on medical discussion.   HPI:  [name] is a 47 y.o. with PMH as below.   Please see A&P for assessment of the patient's acute and chronic medical conditions.   Past Medical History:  Diagnosis Date   Analgesic overuse headache 06/28/2021   Anemia, posthemorrhagic, acute 10/14/2022   Anxiety    CAD (coronary artery disease)    CAP (community acquired pneumonia) 09/29/2022   CVA (cerebral vascular accident) [facility])    Depression 06/28/2006   Qualifier: Diagnosis of  By: Laveda Abbe MD, Todd     Diabetes mellitus type 2 in obese 06/28/1994   Dyspnea    uses oxygeb 2 liters per minute at dialysis   End stage renal disease on dialysis [facility])    on hemodialysis T/Th/Sat   Erosive esophagitis    Esophageal reflux    Eye redness    Fecal incontinence 02/04/2018   Gastroparesis    ? diabetic   GERD (gastroesophageal reflux disease)    Human immunodeficiency virus (HIV) disease (HCC) 04/23/2016   Hyperkalemia 10/12/2022   Hyperlipidemia    Hypertension    Hypertensive emergency 11/09/2021   Hypertensive urgency 05/20/2021   Metabolic bone disease 05/02/2019   Moderate nonproliferative diabetic retinopathy of both eyes (HCC) 11/21/2014   11/14/14: Noted on retinal imaging; needs follow-up imaging in 6 months  05/22/16: Noted on retinal imaging again; needs follow-up  imaging in 6 months   PAD (peripheral artery disease) (HCC)    Type 2 diabetes mellitus with diabetic peripheral angiopathy without gangrene (HCC) 05/01/2019   Wound infection s/p L transmetatarsal amputation    Review of Systems: Negative except stated in the assessment and plan    Assessment & Plan:   S/P AKA (above knee amputation), right (HCC) Patient calls in concerned about pain in her stump on the right side.  She was recently admitted for osteomyelitis and her BKA was then transitioned to a AKA.  She had a fall last week on that stump (6 days ago) and was told by her vascular clinic to ask her primary care for pain management.  She denies any discharge from the wound, and he has since, fevers, chills.  She was previously prescribed Dilaudid after her procedures for pain management, and patient was asking if this can be prescribed again.  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  Overall with soreness and known recent trauma, will treat conservatively with lidocaine patches and patient is agreeable.  I have spent 11 minutes discussing her medical problems on the phone with her.    Patient discussed with Dr. Maryagnes Amos Ekaterina Denise Internal Medicine Resident

## 2023-04-06 NOTE — Telephone Encounter (Signed)
ERROR

## 2023-04-07 ENCOUNTER — Ambulatory Visit: Payer: Medicaid Other

## 2023-04-07 ENCOUNTER — Ambulatory Visit (HOSPITAL_COMMUNITY): Admission: RE | Admit: 2023-04-07 | Payer: 59 | Source: Ambulatory Visit

## 2023-04-07 NOTE — Addendum Note (Signed)
Addended by: Olegario Messier on: 04/07/2023 09:35 AM   Modules accepted: Level of Service

## 2023-04-07 NOTE — Progress Notes (Signed)
 Internal Medicine Clinic Attending  Case discussed with the resident physician at the time of the visit.  We reviewed the patient's history, exam, and pertinent patient test results.  I agree with the assessment, diagnosis, and plan of care documented in the resident's note.

## 2023-04-08 DIAGNOSIS — D631 Anemia in chronic kidney disease: Secondary | ICD-10-CM | POA: Diagnosis not present

## 2023-04-08 DIAGNOSIS — T8249XA Other complication of vascular dialysis catheter, initial encounter: Secondary | ICD-10-CM | POA: Diagnosis not present

## 2023-04-08 DIAGNOSIS — N2581 Secondary hyperparathyroidism of renal origin: Secondary | ICD-10-CM | POA: Diagnosis not present

## 2023-04-08 DIAGNOSIS — E8770 Fluid overload, unspecified: Secondary | ICD-10-CM | POA: Diagnosis not present

## 2023-04-08 DIAGNOSIS — R197 Diarrhea, unspecified: Secondary | ICD-10-CM | POA: Diagnosis not present

## 2023-04-08 DIAGNOSIS — D689 Coagulation defect, unspecified: Secondary | ICD-10-CM | POA: Diagnosis not present

## 2023-04-08 DIAGNOSIS — Z992 Dependence on renal dialysis: Secondary | ICD-10-CM | POA: Diagnosis not present

## 2023-04-08 DIAGNOSIS — L299 Pruritus, unspecified: Secondary | ICD-10-CM | POA: Diagnosis not present

## 2023-04-08 DIAGNOSIS — N186 End stage renal disease: Secondary | ICD-10-CM | POA: Diagnosis not present

## 2023-04-08 DIAGNOSIS — I120 Hypertensive chronic kidney disease with stage 5 chronic kidney disease or end stage renal disease: Secondary | ICD-10-CM | POA: Diagnosis not present

## 2023-04-10 DIAGNOSIS — T8249XA Other complication of vascular dialysis catheter, initial encounter: Secondary | ICD-10-CM | POA: Diagnosis not present

## 2023-04-10 DIAGNOSIS — E8770 Fluid overload, unspecified: Secondary | ICD-10-CM | POA: Diagnosis not present

## 2023-04-10 DIAGNOSIS — Z992 Dependence on renal dialysis: Secondary | ICD-10-CM | POA: Diagnosis not present

## 2023-04-10 DIAGNOSIS — R197 Diarrhea, unspecified: Secondary | ICD-10-CM | POA: Diagnosis not present

## 2023-04-10 DIAGNOSIS — N186 End stage renal disease: Secondary | ICD-10-CM | POA: Diagnosis not present

## 2023-04-10 DIAGNOSIS — N2581 Secondary hyperparathyroidism of renal origin: Secondary | ICD-10-CM | POA: Diagnosis not present

## 2023-04-10 DIAGNOSIS — D631 Anemia in chronic kidney disease: Secondary | ICD-10-CM | POA: Diagnosis not present

## 2023-04-10 DIAGNOSIS — D689 Coagulation defect, unspecified: Secondary | ICD-10-CM | POA: Diagnosis not present

## 2023-04-10 DIAGNOSIS — L299 Pruritus, unspecified: Secondary | ICD-10-CM | POA: Diagnosis not present

## 2023-04-10 DIAGNOSIS — I120 Hypertensive chronic kidney disease with stage 5 chronic kidney disease or end stage renal disease: Secondary | ICD-10-CM | POA: Diagnosis not present

## 2023-04-10 DIAGNOSIS — E1129 Type 2 diabetes mellitus with other diabetic kidney complication: Secondary | ICD-10-CM | POA: Diagnosis not present

## 2023-04-13 DIAGNOSIS — N2581 Secondary hyperparathyroidism of renal origin: Secondary | ICD-10-CM | POA: Diagnosis not present

## 2023-04-13 DIAGNOSIS — T8249XA Other complication of vascular dialysis catheter, initial encounter: Secondary | ICD-10-CM | POA: Diagnosis not present

## 2023-04-13 DIAGNOSIS — D689 Coagulation defect, unspecified: Secondary | ICD-10-CM | POA: Diagnosis not present

## 2023-04-13 DIAGNOSIS — D631 Anemia in chronic kidney disease: Secondary | ICD-10-CM | POA: Diagnosis not present

## 2023-04-13 DIAGNOSIS — R197 Diarrhea, unspecified: Secondary | ICD-10-CM | POA: Diagnosis not present

## 2023-04-13 DIAGNOSIS — N186 End stage renal disease: Secondary | ICD-10-CM | POA: Diagnosis not present

## 2023-04-13 DIAGNOSIS — L299 Pruritus, unspecified: Secondary | ICD-10-CM | POA: Diagnosis not present

## 2023-04-13 DIAGNOSIS — Z992 Dependence on renal dialysis: Secondary | ICD-10-CM | POA: Diagnosis not present

## 2023-04-14 ENCOUNTER — Ambulatory Visit (INDEPENDENT_AMBULATORY_CARE_PROVIDER_SITE_OTHER): Payer: 59 | Admitting: Physician Assistant

## 2023-04-14 ENCOUNTER — Ambulatory Visit (HOSPITAL_COMMUNITY)
Admission: RE | Admit: 2023-04-14 | Discharge: 2023-04-14 | Disposition: A | Discharge status: Home / Self Care | Payer: 59 | Source: Ambulatory Visit | Attending: Vascular Surgery | Admitting: Vascular Surgery

## 2023-04-14 VITALS — BP 171/93 | HR 85 | Temp 98.1°F | Resp 18 | Ht 65.0 in | Wt 147.0 lb

## 2023-04-14 DIAGNOSIS — N186 End stage renal disease: Secondary | ICD-10-CM

## 2023-04-14 DIAGNOSIS — I739 Peripheral vascular disease, unspecified: Secondary | ICD-10-CM

## 2023-04-14 DIAGNOSIS — Z992 Dependence on renal dialysis: Secondary | ICD-10-CM

## 2023-04-14 NOTE — Progress Notes (Signed)
Office Note     CC:  follow up Requesting Provider:  Arita Miss, MD  HPI: Kathy Frank is a 47 y.o. (1975-09-09) female who presents for evaluation of left arm AV graft due to difficulty cannulating.  She underwent left AV graft placement in [place] earlier this year.  She required complex debridement with complex skin closure with Dr. Karin Lieu on 12/31/2022.  She still has a right IJ TDC.  Based on the referral notes from nephrology, they would like left arm graft evaluated prior to removing TDC due to cannulating issues that are ongoing.  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 Randie Heinz on 02/19/2023.  She sustained a fall on her right AKA and was evaluated last month.  She is ready for staple removal.  She claims to wash her incision with Dial soap on a daily basis.  She states she has some drainage from mid incision.   Past Medical History:  Diagnosis Date   Analgesic overuse headache 06/28/2021   Anemia, posthemorrhagic, acute 10/14/2022   Anxiety    CAD (coronary artery disease)    CAP (community acquired pneumonia) 09/29/2022   CVA (cerebral vascular accident) [facility])    Depression 06/28/2006   Qualifier: Diagnosis of  By: Laveda Abbe MD, Todd     Diabetes mellitus type 2 in obese 06/28/1994   Dyspnea    uses oxygeb 2 liters per minute at dialysis   End stage renal disease on dialysis [facility])    on hemodialysis T/Th/Sat   Erosive esophagitis    Esophageal reflux    Eye redness    Fecal incontinence 02/04/2018   Gastroparesis    ? diabetic   GERD (gastroesophageal reflux disease)    Human immunodeficiency virus (HIV) disease (HCC) 04/23/2016   Hyperkalemia 10/12/2022   Hyperlipidemia    Hypertension    Hypertensive emergency 11/09/2021   Hypertensive urgency 05/20/2021   Metabolic bone disease 05/02/2019   Moderate nonproliferative diabetic retinopathy of both eyes (HCC) 11/21/2014   11/14/14: Noted on retinal imaging; needs follow-up imaging  in 6 months  05/22/16: Noted on retinal imaging again; needs follow-up imaging in 6 months   PAD (peripheral artery disease) (HCC)    Type 2 diabetes mellitus with diabetic peripheral angiopathy without gangrene (HCC) 05/01/2019   Wound infection s/p L transmetatarsal amputation     Past Surgical History:  Procedure Laterality Date   ABDOMINAL AORTOGRAM W/LOWER EXTREMITY N/A 12/13/2020   Procedure: ABDOMINAL AORTOGRAM W/LOWER EXTREMITY;  Surgeon: Chuck Hint, MD;  Location: [facility] INVASIVE CV LAB;  Service: Cardiovascular;  Laterality: N/A;   AMPUTATION Left 07/08/2018   Procedure: AMPUTATION FORTH RAY LEFT FOOT;  Surgeon: Nadara Mustard, MD;  Location: [facility] OR;  Service: Orthopedics;  Laterality: Left;   AMPUTATION Left 08/09/2018   Procedure: Left Transmetatarsal Amputation;  Surgeon: Nadara Mustard, MD;  Location: [facility] OR;  Service: Orthopedics;  Laterality: Left;   AMPUTATION Left 10/08/2018   Procedure: LEFT BELOW KNEE AMPUTATION;  Surgeon: Nadara Mustard, MD;  Location: [facility] OR;  Service: Orthopedics;  Laterality: Left;   AMPUTATION Left 10/28/2018   Procedure: REVISION BELOW KNEE AMPUTATION;  Surgeon: Nadara Mustard, MD;  Location: [facility] OR;  Service: Orthopedics;  Laterality: Left;   AMPUTATION Left 12/16/2018   Procedure: LEFT ABOVE KNEE AMPUTATION;  Surgeon: Nadara Mustard, MD;  Location: [facility] OR;  Service: Orthopedics;  Laterality: Left;   AMPUTATION Right 01/31/2021   Procedure: AMPUTATION BELOW KNEE;  Surgeon:  Nada Libman, MD;  Location: [facility] OR;  Service: Vascular;  Laterality: Right;   AMPUTATION Right 03/28/2021   Procedure: REVISION OF RIGHT BELOW KNEE AMPUTATION;  Surgeon: Nada Libman, MD;  Location: [facility] OR;  Service: Vascular;  Laterality: Right;   AMPUTATION Right 04/18/2021   Procedure: right below knee amputation/washout placement wound vac;  Surgeon: Leonie Douglas, MD;  Location: [facility] OR;  Service: Vascular;  Laterality: Right;   AMPUTATION Right 02/19/2023   Procedure:  CONVERSION TO ABOVE KNEE AMPUTATION;  Surgeon: Maeola Harman, MD;  Location: [facility] OR;  Service: Vascular;  Laterality: Right;   APPLICATION OF WOUND VAC Left 12/31/2022   Procedure: APPLICATION OF PREVENA WOUND VAC;  Surgeon: Victorino Sparrow, MD;  Location: [facility] OR;  Service: Vascular;  Laterality: Left;   AV FISTULA PLACEMENT Left 10/14/2018   Procedure: Arteriovenous (Av) Fistula Creation Left Arm;  Surgeon: Cephus Shelling, MD;  Location: [facility] OR;  Service: Vascular;  Laterality: Left;   BASCILIC VEIN TRANSPOSITION Left 04/14/2019   Procedure: BASILIC VEIN TRANSPOSITION SECOND STAGE LEFT ARM;  Surgeon: Larina Earthly, MD;  Location: [facility] OR;  Service: Vascular;  Laterality: Left;   BIOPSY  10/14/2022   Procedure: BIOPSY;  Surgeon: Benancio Deeds, MD;  Location: [facility] ENDOSCOPY;  Service: Gastroenterology;;   COMPLEX WOUND CLOSURE Left 12/31/2022   Procedure: COMPLEX WOUND CLOSURE;  Surgeon: Victorino Sparrow, MD;  Location: [facility] OR;  Service: Vascular;  Laterality: Left;   CORONARY STENT INTERVENTION N/A 05/21/2021   Procedure: CORONARY STENT INTERVENTION;  Surgeon: Marykay Lex, MD;  Location: [facility] INVASIVE CV LAB;  Service: Cardiovascular;  Laterality: N/A;   ESOPHAGOGASTRODUODENOSCOPY (EGD) WITH PROPOFOL N/A 10/14/2022   Procedure: ESOPHAGOGASTRODUODENOSCOPY (EGD) WITH PROPOFOL;  Surgeon: Benancio Deeds, MD;  Location: [facility] ENDOSCOPY;  Service: Gastroenterology;  Laterality: N/A;   HEMOSTASIS CLIP PLACEMENT  10/14/2022   Procedure: HEMOSTASIS CLIP PLACEMENT;  Surgeon: Benancio Deeds, MD;  Location: [facility] ENDOSCOPY;  Service: Gastroenterology;;   INCISION AND DRAINAGE Left 12/31/2022   Procedure: INCISION AND DRAINAGE OF LEFT ARM;  Surgeon: Victorino Sparrow, MD;  Location: [facility] OR;  Service: Vascular;  Laterality: Left;   INCISION AND DRAINAGE ABSCESS N/A 10/24/2020   Procedure: exicision of hydradenitis;  Surgeon: Romie Levee, MD;  Location: [facility];  Service:  General;  Laterality: N/A;  45 min   INSERTION OF DIALYSIS CATHETER Right 04/14/2019   Procedure: INSERTION OF DIALYSIS CATHETER;  Surgeon: Larina Earthly, MD;  Location: [facility] OR;  Service: Vascular;  Laterality: Right;   IR FLUORO GUIDE CV LINE RIGHT  10/13/2022   IR REMOVAL TUN CV CATH W/O FL  10/16/2022   IR US GUIDE VASC ACCESS RIGHT  10/13/2022   LEFT HEART CATH AND CORONARY ANGIOGRAPHY N/A 05/21/2021   Procedure: LEFT HEART CATH AND CORONARY ANGIOGRAPHY;  Surgeon: Marykay Lex, MD;  Location: [facility] INVASIVE CV LAB;  Service: Cardiovascular;  Laterality: N/A;   LOWER EXTREMITY ANGIOGRAPHY N/A 07/05/2018   Procedure: LOWER EXTREMITY ANGIOGRAPHY;  Surgeon: Nada Libman, MD;  Location: [facility] INVASIVE CV LAB;  Service: Cardiovascular;  Laterality: N/A;   PERIPHERAL VASCULAR ATHERECTOMY Right 12/13/2020   Procedure: PERIPHERAL VASCULAR ATHERECTOMY;  Surgeon: Chuck Hint, MD;  Location: [facility] INVASIVE CV LAB;  Service: Cardiovascular;  Laterality: Right;  Superficial femoral   PERIPHERAL VASCULAR BALLOON ANGIOPLASTY Left 07/05/2018   Procedure: PERIPHERAL VASCULAR BALLOON ANGIOPLASTY;  Surgeon: Nada Libman, MD;  Location: [facility] INVASIVE CV LAB;  Service: Cardiovascular;  Laterality: Left;  SFA   PERIPHERAL VASCULAR BALLOON ANGIOPLASTY Right 12/13/2020   Procedure: PERIPHERAL VASCULAR BALLOON ANGIOPLASTY;  Surgeon: Chuck Hint, MD;  Location: [facility] INVASIVE CV LAB;  Service: Cardiovascular;  Laterality: Right;  Peroneal artery, anterior tibial artery   RECTAL EXAM UNDER ANESTHESIA N/A 10/24/2020   Procedure: EXAM UNDER ANESTHESIA;  Surgeon: Romie Levee, MD;  Location: [facility];  Service: General;  Laterality: N/A;   STUMP REVISION Left 10/19/2018   Procedure: REVISION LEFT BELOW KNEE AMPUTATION;  Surgeon: Nadara Mustard, MD;  Location: [facility] OR;  Service: Orthopedics;  Laterality: Left;   TUBAL LIGATION  2002    Social History   Socioeconomic History   Marital status:  Single    Spouse name: Not on file   Number of children: Not on file   Years of education: 10   Highest education level: Not on file  Occupational History    Employer: UNEMPLOYED  Tobacco Use   Smoking status: Former    Current packs/day: 0.00    Average packs/day: 0.1 packs/day for 10.0 years (1.0 ttl pk-yrs)    Types: Cigarettes    Start date: 11/17/2013    Quit date: 09/09/2014    Years since quitting: 8.6   Smokeless tobacco: Never  Vaping Use   Vaping status: Never Used  Substance and Sexual Activity   Alcohol use: Not Currently   Drug use: Not Currently    Types: Marijuana    Comment: 2 times a monthy -  `last time [place] 2022   Sexual activity: Not Currently    Partners: Female, Female  Other Topics Concern   Not on file  Social History Narrative   Not on file   Social Determinants of Health   Financial Resource Strain: Low Risk  (10/19/2022)   Overall Financial Resource Strain (CARDIA)    Difficulty of Paying Living Expenses: Not hard at all  Food Insecurity: No Food Insecurity (02/15/2023)   Hunger Vital Sign    Worried About Running Out of Food in the Last Year: Never true    Ran Out of Food in the Last Year: Never true  Transportation Needs: No Transportation Needs (02/15/2023)   PRAPARE - Administrator, Civil Service (Medical): No    Lack of Transportation (Non-Medical): No  Recent Concern: Transportation Needs - Unmet Transportation Needs (11/30/2022)   [facility] from [facility]   PRAPARE - Transportation    Lack of Transportation (Medical): Not on file    Lack of Transportation (Non-Medical): Yes  Physical Activity: Inactive (10/19/2022)   Exercise Vital Sign    Days of Exercise per Week: 0 days    Minutes of Exercise per Session: 0 min  Stress: Stress Concern Present (10/19/2022)   [employer] of Occupational Health - Occupational Stress Questionnaire    Feeling of  Stress : Very much  Social Connections: Socially Isolated (10/19/2022)   Social Connection and Isolation Panel [NHANES]    Frequency of Communication with Friends and Family: More than three times a week    Frequency of Social Gatherings with Friends and Family: Never    Attends Religious Services: Never    Database administrator or Organizations: No    Attends Banker Meetings: Never    Marital Status: Never married  Intimate Partner Violence: Not At Risk (02/15/2023)   Humiliation, Afraid, Rape, and Kick questionnaire    Fear of Current  or Ex-Partner: No    Emotionally Abused: No    Physically Abused: No    Sexually Abused: No    Family History  Problem Relation Age of Onset   Diabetes Mother    Diabetes Brother    Diabetes Daughter    Diabetes Daughter    Mental retardation Brother        died from PNA   Diabetes Maternal Grandmother     Current Outpatient Medications  Medication Sig Dispense Refill   Accu-Chek Softclix Lancets lancets Use as instructed 100 each 12   acetaminophen (TYLENOL) 500 MG tablet Take 2 tablets (1,000 mg total) by mouth every 6 (six) hours. 30 tablet 0   albuterol (VENTOLIN HFA) 108 (90 Base) MCG/ACT inhaler Inhale 2 puffs into the lungs every 4 (four) hours as needed for wheezing or shortness of breath. 8.5 g 0   amLODipine-olmesartan (AZOR) 10-40 MG tablet Take 1 tablet by mouth every evening.     amLODipine-olmesartan (AZOR) 5-20 MG tablet Take 1 tablet by mouth daily. 30 tablet 0   aspirin EC (EQ ASPIRIN ADULT LOW DOSE) 81 MG tablet Take 1 tablet (81 mg total) by mouth daily. Swallow whole. 30 tablet 3   atorvastatin (LIPITOR) 80 MG tablet Take 1 tablet (80 mg total) by mouth daily. 30 tablet 11   bictegravir-emtricitabine-tenofovir AF (BIKTARVY) 50-200-25 MG TABS tablet Take 1 tablet by mouth daily. OVERDUE FOR OFFICE FOLLOW UP - PLEASE CALL OUR OFFICE TO SCHEDULE [phone] 30 tablet 5   bisacodyl (DULCOLAX) 10 MG suppository Place 1  suppository (10 mg total) rectally as needed for moderate constipation. 12 suppository 0   calcitRIOL (ROCALTROL) 0.25 MCG capsule Take 5 capsules (1.25 mcg total) by mouth Every Tuesday,Thursday,and Saturday with dialysis. 60 capsule 0   calcium acetate (PHOSLO) 667 MG capsule Take 2 capsules (1,334 mg total) by mouth 3 (three) times daily with meals. 180 capsule 0   Continuous Blood Gluc Sensor (DEXCOM G7 SENSOR) MISC Use to check blood sugar continuously 10 each 6   Continuous Glucose Receiver (DEXCOM G7 RECEIVER) DEVI One box 1 each 0   cyclobenzaprine (FLEXERIL) 5 MG tablet Take 5 mg by mouth 2 (two) times daily as needed for muscle spasms.     Darbepoetin Alfa (ARANESP) 200 MCG/0.4ML SOSY injection Inject 0.4 mLs (200 mcg total) into the skin every Monday at 6 PM. (Patient taking differently: Inject 200 mcg into the skin every 7 (seven) days. Every Monday) 1.68 mL    diclofenac Sodium (VOLTAREN) 1 % GEL Apply 4 g topically 4 (four) times daily. (Patient taking differently: Apply 4 g topically daily as needed (for pain).) 100 g 0   escitalopram (LEXAPRO) 5 MG tablet Take 1 tablet (5 mg total) by mouth daily. 30 tablet 2   gabapentin (NEURONTIN) 100 MG capsule Take 100 mg by mouth at bedtime.     glucose blood test strip Use as instructed 100 each 12   hydrocortisone-pramoxine (PROCTOFOAM-HC) rectal foam Place 1 applicator rectally 2 (two) times daily. 10 g 1   insulin glargine (LANTUS SOLOSTAR) 100 UNIT/ML Solostar Pen Inject 15 Units into the skin at bedtime. 6 mL 4   Insulin Pen Needle (PENTIPS) 32G X 4 MM MISC Use as directed 100 each 3   lidocaine (LIDODERM) 5 % Place 1 patch onto the skin daily. Remove & Discard patch within 12 hours or as directed by MD 14 patch 0   liraglutide (VICTOZA) 18 MG/3ML SOPN Inject 0.6 mg into the skin daily.  3 mL 11   LOKELMA 5 g packet Take 5 g by mouth daily.     multivitamin (RENA-VIT) TABS tablet Take 1 tablet by mouth daily. (Patient taking differently:  Take 1 tablet by mouth See admin instructions. On Tuesday, Wednesday and Thursday) 30 tablet 0   ondansetron (ZOFRAN) 8 MG tablet Take 1 tablet (8 mg total) by mouth every 8 (eight) hours as needed for nausea or vomiting. 12 tablet 0   ondansetron (ZOFRAN-ODT) 8 MG disintegrating tablet Take 1 tablet (8 mg total) by mouth every 8 (eight) hours as needed for nausea or vomiting. 10 tablet 0   pantoprazole (PROTONIX) 40 MG tablet Take 1 tablet (40 mg total) by mouth 2 (two) times daily. 60 tablet 2   polyethylene glycol (MIRALAX / GLYCOLAX) 17 g packet Take 17 g by mouth 2 (two) times daily. 14 each 0   senna (SENOKOT) 8.6 MG TABS tablet Take 1 tablet (8.6 mg total) by mouth daily as needed for mild constipation. 120 tablet 0   senna-docusate (SENOKOT-S) 8.6-50 MG tablet Take 1 tablet by mouth at bedtime. 30 tablet 2   Ubrogepant (UBRELVY) 50 MG TABS Take 1 tablet (50 mg total) by mouth as needed (migraine headache). if needed a second dose may be taken at least 2 hours after the initial dose; MAX 8 doses in a month 8 tablet 3   Current Facility-Administered Medications  Medication Dose Route Frequency Provider Last Rate Last Admin   busPIRone (BUSPAR) tablet 2.5 mg  2.5 mg Oral BID         No Known Allergies   REVIEW OF SYSTEMS:   [X]  denotes positive finding, [ ]  denotes negative finding Cardiac  Comments:  Chest pain or chest pressure:    Shortness of breath upon exertion:    Short of breath when lying flat:    Irregular heart rhythm:        Vascular    Pain in calf, thigh, or hip brought on by ambulation:    Pain in feet at night that wakes you up from your sleep:     Blood clot in your veins:    Leg swelling:         Pulmonary    Oxygen at home:    Productive cough:     Wheezing:         Neurologic    Sudden weakness in arms or legs:     Sudden numbness in arms or legs:     Sudden onset of difficulty speaking or slurred speech:    Temporary loss of vision in one eye:      Problems with dizziness:         Gastrointestinal    Blood in stool:     Vomited blood:         Genitourinary    Burning when urinating:     Blood in urine:        Psychiatric    Major depression:         Hematologic    Bleeding problems:    Problems with blood clotting too easily:        Skin    Rashes or ulcers:        Constitutional    Fever or chills:      PHYSICAL EXAMINATION:  Vitals:   04/14/23 0907  BP: (!) 171/93  Pulse: 85  Resp: 18  Temp: 98.1 F (36.7 C)  TempSrc: Temporal  SpO2: 96%  Weight: 147  lb (66.7 kg)  Height: 5\' 5"  (1.651 m)    General:  WDWN in NAD; vital signs documented above Gait: Not observed HENT: WNL, normocephalic Pulmonary: normal non-labored breathing , without Rales, rhonchi,  wheezing Cardiac: regular HR Abdomen: soft, NT, no masses Skin: without rashes Extremities:   Right AKA with scabbing around stable placement; mid incision with some serosanguineous drainage with manipulation; no firm fluid collection or fluctuant areas; palpable thrill in left arm AV graft Musculoskeletal: no muscle wasting or atrophy  Neurologic: A&O X 3 Psychiatric:  The pt has Normal affect.   Non-Invasive Vascular Imaging:   Left arm dialysis duplex demonstrates widely patent left arm AV graft    ASSESSMENT/PLAN:: 47 y.o. female here for follow up for evaluation of right AKA as well as evaluation of left arm AV graft due to difficulty cannulating  -Right above-the-knee amputation overall appears to be healing well.  She does have some serosanguineous drainage with manipulation over the mid incision however there are no areas of fluctuance or firm fluid collection.  All staples were removed.  I stressed the importance of once or twice a day cleansing with antibacterial hand soap and water.  She will notify our office if incision fails to completely heal.  -Surgical history also significant for debridement of wounds in the left arm after AV graft  placement in [place] earlier this year.  She has healed her incisions.  On exam she has a palpable thrill throughout the graft in her upper arm.  Duplex today demonstrates a widely patent AV graft.  She however has had difficulty cannulating for dialysis.  Patient states her nephrologist does not want to remove the right IJ TDC until the left arm graft has been evaluated.  Patient will be scheduled for shuntogram with possible intervention in the near future on a nondialysis day.  Patient is also aware that she may not have any further options for dialysis access in the left arm should this access fail.  Shuntogram was discussed in detail and she is agreeable to proceed.   Emilie Rutter, PA-C [facility] [phone]  Clinic MD:   Randie Heinz

## 2023-04-15 DIAGNOSIS — D689 Coagulation defect, unspecified: Secondary | ICD-10-CM | POA: Diagnosis not present

## 2023-04-15 DIAGNOSIS — R197 Diarrhea, unspecified: Secondary | ICD-10-CM | POA: Diagnosis not present

## 2023-04-15 DIAGNOSIS — Z992 Dependence on renal dialysis: Secondary | ICD-10-CM | POA: Diagnosis not present

## 2023-04-15 DIAGNOSIS — N186 End stage renal disease: Secondary | ICD-10-CM | POA: Diagnosis not present

## 2023-04-15 DIAGNOSIS — N2581 Secondary hyperparathyroidism of renal origin: Secondary | ICD-10-CM | POA: Diagnosis not present

## 2023-04-15 DIAGNOSIS — L299 Pruritus, unspecified: Secondary | ICD-10-CM | POA: Diagnosis not present

## 2023-04-15 DIAGNOSIS — D631 Anemia in chronic kidney disease: Secondary | ICD-10-CM | POA: Diagnosis not present

## 2023-04-15 DIAGNOSIS — T8249XA Other complication of vascular dialysis catheter, initial encounter: Secondary | ICD-10-CM | POA: Diagnosis not present

## 2023-04-16 ENCOUNTER — Other Ambulatory Visit: Payer: Self-pay

## 2023-04-16 DIAGNOSIS — Z992 Dependence on renal dialysis: Secondary | ICD-10-CM

## 2023-04-16 MED ORDER — SODIUM CHLORIDE 0.9% FLUSH: 3.0000 mL | Freq: Two times a day (BID) | INTRAVENOUS | Status: DC

## 2023-04-16 MED ORDER — SODIUM CHLORIDE 0.9 % IV SOLN: 250.0000 mL | INTRAVENOUS | Status: DC | PRN

## 2023-04-16 MED ORDER — SODIUM CHLORIDE 0.9% FLUSH: 3.0000 mL | INTRAVENOUS | Status: DC | PRN

## 2023-04-17 DIAGNOSIS — Z992 Dependence on renal dialysis: Secondary | ICD-10-CM | POA: Diagnosis not present

## 2023-04-17 DIAGNOSIS — R197 Diarrhea, unspecified: Secondary | ICD-10-CM | POA: Diagnosis not present

## 2023-04-17 DIAGNOSIS — T8249XA Other complication of vascular dialysis catheter, initial encounter: Secondary | ICD-10-CM | POA: Diagnosis not present

## 2023-04-17 DIAGNOSIS — N2581 Secondary hyperparathyroidism of renal origin: Secondary | ICD-10-CM | POA: Diagnosis not present

## 2023-04-17 DIAGNOSIS — L299 Pruritus, unspecified: Secondary | ICD-10-CM | POA: Diagnosis not present

## 2023-04-17 DIAGNOSIS — N186 End stage renal disease: Secondary | ICD-10-CM | POA: Diagnosis not present

## 2023-04-17 DIAGNOSIS — D689 Coagulation defect, unspecified: Secondary | ICD-10-CM | POA: Diagnosis not present

## 2023-04-17 DIAGNOSIS — D631 Anemia in chronic kidney disease: Secondary | ICD-10-CM | POA: Diagnosis not present

## 2023-04-20 ENCOUNTER — Emergency Department (HOSPITAL_COMMUNITY): Payer: 59

## 2023-04-20 ENCOUNTER — Other Ambulatory Visit: Payer: Self-pay

## 2023-04-20 ENCOUNTER — Emergency Department (HOSPITAL_COMMUNITY)
Admission: EM | Admit: 2023-04-20 | Discharge: 2023-04-20 | Disposition: A | Discharge status: Home / Self Care | Payer: 59 | Attending: Emergency Medicine | Admitting: Emergency Medicine

## 2023-04-20 ENCOUNTER — Encounter (HOSPITAL_COMMUNITY): Payer: Self-pay

## 2023-04-20 DIAGNOSIS — E1122 Type 2 diabetes mellitus with diabetic chronic kidney disease: Secondary | ICD-10-CM | POA: Diagnosis not present

## 2023-04-20 DIAGNOSIS — Z7982 Long term (current) use of aspirin: Secondary | ICD-10-CM | POA: Diagnosis not present

## 2023-04-20 DIAGNOSIS — R4182 Altered mental status, unspecified: Secondary | ICD-10-CM | POA: Diagnosis present

## 2023-04-20 DIAGNOSIS — T8249XA Other complication of vascular dialysis catheter, initial encounter: Secondary | ICD-10-CM | POA: Diagnosis not present

## 2023-04-20 DIAGNOSIS — Z992 Dependence on renal dialysis: Secondary | ICD-10-CM | POA: Insufficient documentation

## 2023-04-20 DIAGNOSIS — D631 Anemia in chronic kidney disease: Secondary | ICD-10-CM | POA: Diagnosis not present

## 2023-04-20 DIAGNOSIS — N2581 Secondary hyperparathyroidism of renal origin: Secondary | ICD-10-CM | POA: Diagnosis not present

## 2023-04-20 DIAGNOSIS — Z79899 Other long term (current) drug therapy: Secondary | ICD-10-CM | POA: Diagnosis not present

## 2023-04-20 DIAGNOSIS — Z7984 Long term (current) use of oral hypoglycemic drugs: Secondary | ICD-10-CM | POA: Insufficient documentation

## 2023-04-20 DIAGNOSIS — I499 Cardiac arrhythmia, unspecified: Secondary | ICD-10-CM | POA: Diagnosis not present

## 2023-04-20 DIAGNOSIS — R404 Transient alteration of awareness: Secondary | ICD-10-CM | POA: Diagnosis not present

## 2023-04-20 DIAGNOSIS — R000 Tachycardia, unspecified: Secondary | ICD-10-CM | POA: Diagnosis not present

## 2023-04-20 DIAGNOSIS — N186 End stage renal disease: Secondary | ICD-10-CM | POA: Insufficient documentation

## 2023-04-20 DIAGNOSIS — Z743 Need for continuous supervision: Secondary | ICD-10-CM | POA: Diagnosis not present

## 2023-04-20 DIAGNOSIS — Z794 Long term (current) use of insulin: Secondary | ICD-10-CM | POA: Diagnosis not present

## 2023-04-20 DIAGNOSIS — D689 Coagulation defect, unspecified: Secondary | ICD-10-CM | POA: Diagnosis not present

## 2023-04-20 DIAGNOSIS — R197 Diarrhea, unspecified: Secondary | ICD-10-CM | POA: Diagnosis not present

## 2023-04-20 DIAGNOSIS — L299 Pruritus, unspecified: Secondary | ICD-10-CM | POA: Diagnosis not present

## 2023-04-20 DIAGNOSIS — I2510 Atherosclerotic heart disease of native coronary artery without angina pectoris: Secondary | ICD-10-CM | POA: Insufficient documentation

## 2023-04-20 DIAGNOSIS — Z21 Asymptomatic human immunodeficiency virus [HIV] infection status: Secondary | ICD-10-CM | POA: Insufficient documentation

## 2023-04-20 DIAGNOSIS — R0989 Other specified symptoms and signs involving the circulatory and respiratory systems: Secondary | ICD-10-CM | POA: Diagnosis not present

## 2023-04-20 DIAGNOSIS — R6889 Other general symptoms and signs: Secondary | ICD-10-CM | POA: Diagnosis not present

## 2023-04-20 DIAGNOSIS — Z452 Encounter for adjustment and management of vascular access device: Secondary | ICD-10-CM | POA: Diagnosis not present

## 2023-04-20 DIAGNOSIS — I672 Cerebral atherosclerosis: Secondary | ICD-10-CM | POA: Diagnosis not present

## 2023-04-20 DIAGNOSIS — I700 Atherosclerosis of aorta: Secondary | ICD-10-CM | POA: Diagnosis not present

## 2023-04-20 LAB — COMPREHENSIVE METABOLIC PANEL
ALT: 11 U/L (ref 0–44)
AST: 14 U/L — ABNORMAL LOW (ref 15–41)
Albumin: 3.7 g/dL (ref 3.5–5.0)
Alkaline Phosphatase: 86 U/L (ref 38–126)
Anion gap: 18 — ABNORMAL HIGH (ref 5–15)
BUN: 26 mg/dL — ABNORMAL HIGH (ref 6–20)
CO2: 22 mmol/L (ref 22–32)
Calcium: 9.6 mg/dL (ref 8.9–10.3)
Chloride: 96 mmol/L — ABNORMAL LOW (ref 98–111)
Creatinine, Ser: 3.91 mg/dL — ABNORMAL HIGH (ref 0.44–1.00)
GFR, Estimated: 14 mL/min — ABNORMAL LOW (ref >60)
Glucose, Bld: 94 mg/dL (ref 70–99)
Potassium: 3.5 mmol/L (ref 3.5–5.1)
Sodium: 136 mmol/L (ref 135–145)
Total Bilirubin: 0.4 mg/dL (ref 0.3–1.2)
Total Protein: 8.9 g/dL — ABNORMAL HIGH (ref 6.5–8.1)

## 2023-04-20 LAB — I-STAT VENOUS BLOOD GAS, ED
Acid-Base Excess: 2 mmol/L (ref 0.0–2.0)
Bicarbonate: 27 mmol/L (ref 20.0–28.0)
Calcium, Ion: 1.09 mmol/L — ABNORMAL LOW (ref 1.15–1.40)
HCT: 43 % (ref 36.0–46.0)
Hemoglobin: 14.6 g/dL (ref 12.0–15.0)
O2 Saturation: 99 %
Potassium: 3.9 mmol/L (ref 3.5–5.1)
Sodium: 137 mmol/L (ref 135–145)
TCO2: 28 mmol/L (ref 22–32)
pCO2, Ven: 43.2 mmHg — ABNORMAL LOW (ref 44–60)
pH, Ven: 7.404 (ref 7.25–7.43)
pO2, Ven: 118 mmHg — ABNORMAL HIGH (ref 32–45)

## 2023-04-20 LAB — CBC WITH DIFFERENTIAL/PLATELET
Abs Immature Granulocytes: 0 10*3/uL (ref 0.00–0.07)
Basophils Absolute: 0.2 10*3/uL — ABNORMAL HIGH (ref 0.0–0.1)
Basophils Relative: 4 %
Eosinophils Absolute: 0.3 10*3/uL (ref 0.0–0.5)
Eosinophils Relative: 5 %
HCT: 33.3 % — ABNORMAL LOW (ref 36.0–46.0)
Hemoglobin: 11.7 g/dL — ABNORMAL LOW (ref 12.0–15.0)
Lymphocytes Relative: 17 %
Lymphs Abs: 0.9 10*3/uL (ref 0.7–4.0)
MCH: 33 pg (ref 26.0–34.0)
MCHC: 35.1 g/dL (ref 30.0–36.0)
MCV: 93.8 fL (ref 80.0–100.0)
Monocytes Absolute: 0.3 10*3/uL (ref 0.1–1.0)
Monocytes Relative: 5 %
Neutro Abs: 3.7 10*3/uL (ref 1.7–7.7)
Neutrophils Relative %: 69 %
Platelets: 145 10*3/uL — ABNORMAL LOW (ref 150–400)
RBC: 3.55 MIL/uL — ABNORMAL LOW (ref 3.87–5.11)
RDW: 22 % — ABNORMAL HIGH (ref 11.5–15.5)
WBC: 5.4 10*3/uL (ref 4.0–10.5)
nRBC: 0 % (ref 0.0–0.2)
nRBC: 1 /100{WBCs} — ABNORMAL HIGH

## 2023-04-20 LAB — AMMONIA: Ammonia: 16 umol/L (ref 9–35)

## 2023-04-20 LAB — HCG, SERUM, QUALITATIVE: Preg, Serum: NEGATIVE

## 2023-04-20 LAB — CBG MONITORING, ED: Glucose-Capillary: 106 mg/dL — ABNORMAL HIGH (ref 70–99)

## 2023-04-20 MED ORDER — NALOXONE HCL 0.4 MG/ML IJ SOLN
0.4000 mg | Freq: Once | INTRAMUSCULAR | Status: AC
  Administered 2023-04-20: 0.4 mg via INTRAVENOUS
  Filled 2023-04-20: qty 1

## 2023-04-20 MED ORDER — LABETALOL HCL 5 MG/ML IV SOLN
20.0000 mg | Freq: Once | INTRAVENOUS | Status: AC
  Administered 2023-04-20: 20 mg via INTRAVENOUS
  Filled 2023-04-20: qty 4

## 2023-04-20 NOTE — ED Notes (Signed)
RN called daughter Genia Harold, Genia Harold staed pt does not need PTAR and she will come pick up pt at 1900. POC explained to pt.

## 2023-04-20 NOTE — Discharge Instructions (Addendum)
Continue your current medications.  Follow-up with your doctors to be rechecked.  Return to the ED for any recurrent symptoms.

## 2023-04-20 NOTE — ED Provider Notes (Addendum)
[place] EMERGENCY DEPARTMENT AT [facility] Provider Note   CSN: [id number] Arrival date & time: 04/20/23  1144     History  Chief Complaint  Patient presents with   Fatigue   Altered Mental Status    Kathy Frank is a 47 y.o. female.   Altered Mental Status    Patient has a history of gastroparesis reflux depression HIV diabetes end-stage renal failure on dialysis coronary artery disease, stroke hypertensive emergency.  Patient presents to the ED for evaluation of altered mental status.  Patient was reportedly at dialysis today.  She reportedly became altered during her dialysis session.  EMS was called and she was brought to the ED.  Patient intermittently opens eyes and looks at me when I speak to her, she curses as the IV was being placed but otherwise does not provide any history  Home Medications Prior to Admission medications   Medication Sig Start Date End Date Taking? Authorizing Provider  Accu-Chek Softclix Lancets lancets Use as instructed 05/02/21   Atway, Rayann N, DO  albuterol (VENTOLIN HFA) 108 (90 Base) MCG/ACT inhaler Inhale 2 puffs into the lungs every 4 (four) hours as needed for wheezing or shortness of breath. 02/14/21   Angiulli, Mcarthur Rossetti, PA-C  amLODipine-olmesartan (AZOR) 10-40 MG tablet Take 1 tablet by mouth every evening. 02/09/23   [provider]  aspirin EC (EQ ASPIRIN ADULT LOW DOSE) 81 MG tablet Take 1 tablet (81 mg total) by mouth daily. Swallow whole. 03/02/23   Kathleen Lime, MD  atorvastatin (LIPITOR) 80 MG tablet Take 1 tablet (80 mg total) by mouth daily. 02/01/23   Willette Cluster, MD  bictegravir-emtricitabine-tenofovir AF (BIKTARVY) 50-200-25 MG TABS tablet Take 1 tablet by mouth daily. OVERDUE FOR OFFICE FOLLOW UP - PLEASE CALL OUR OFFICE TO SCHEDULE [phone] 02/23/23   Veryl Speak, FNP  calcitRIOL (ROCALTROL) 0.25 MCG capsule Take 5 capsules (1.25 mcg total) by mouth Every Tuesday,Thursday,and Saturday with  dialysis. 07/02/22   Marolyn Haller, MD  calcium acetate (PHOSLO) 667 MG capsule Take 2 capsules (1,334 mg total) by mouth 3 (three) times daily with meals. 06/30/22   Marolyn Haller, MD  Continuous Blood Gluc Sensor (DEXCOM G7 SENSOR) MISC Use to check blood sugar continuously 06/30/22   Marolyn Haller, MD  Continuous Glucose Receiver (DEXCOM G7 RECEIVER) DEVI One box 03/02/23   Kathleen Lime, MD  cyclobenzaprine (FLEXERIL) 5 MG tablet Take 5 mg by mouth 2 (two) times daily as needed for muscle spasms.    [provider]  Darbepoetin Alfa (ARANESP) 200 MCG/0.4ML SOSY injection Inject 0.4 mLs (200 mcg total) into the skin every Monday at 6 PM. Patient taking differently: Inject 200 mcg into the skin every 7 (seven) days. Every Monday 07/06/22   Marolyn Haller, MD  escitalopram (LEXAPRO) 5 MG tablet Take 1 tablet (5 mg total) by mouth daily. 02/01/23   Willette Cluster, MD  gabapentin (NEURONTIN) 100 MG capsule Take 100 mg by mouth at bedtime. 09/10/22   [provider]  glucose blood test strip Use as instructed 05/02/21   Atway, Rayann N, DO  hydrocortisone-pramoxine (PROCTOFOAM-HC) rectal foam Place 1 applicator rectally 2 (two) times daily. Patient not taking: Reported on 04/16/2023 11/07/22   Dione Booze, MD  insulin glargine (LANTUS SOLOSTAR) 100 UNIT/ML Solostar Pen Inject 15 Units into the skin at bedtime. 02/01/23   Willette Cluster, MD  Insulin Pen Needle (PENTIPS) 32G X 4 MM MISC Use as directed 11/10/22   Quincy Simmonds, MD  lidocaine (LIDODERM) 5 % Place 1 patch onto the skin daily. Remove & Discard patch within 12 hours or as directed by MD Patient taking differently: Place 1 patch onto the skin daily as needed (pain). Remove & Discard patch within 12 hours or as directed by MD 04/06/23   Nooruddin, Jason Fila, MD  liraglutide (VICTOZA) 18 MG/3ML SOPN Inject 0.6 mg into the skin daily. 03/02/23   Kathleen Lime, MD  LOKELMA 5 g packet Take 5 g by mouth daily. 07/30/22   [provider]  multivitamin (RENA-VIT) TABS tablet Take 1 tablet by mouth daily. Patient taking differently: Take 1 tablet by mouth See admin instructions. On Tuesday, Wednesday and Thursday 02/14/21   Angiulli, Mcarthur Rossetti, PA-C  ondansetron (ZOFRAN) 8 MG tablet Take 1 tablet (8 mg total) by mouth every 8 (eight) hours as needed for nausea or vomiting. 12/25/22   Marrianne Mood, MD  ondansetron (ZOFRAN-ODT) 8 MG disintegrating tablet Take 1 tablet (8 mg total) by mouth every 8 (eight) hours as needed for nausea or vomiting. Patient not taking: Reported on 04/16/2023 02/25/23   Cathren Laine, MD  pantoprazole (PROTONIX) 40 MG tablet Take 1 tablet (40 mg total) by mouth 2 (two) times daily. 12/25/22   Marrianne Mood, MD  polyethylene glycol (MIRALAX / GLYCOLAX) 17 g packet Take 17 g by mouth 2 (two) times daily. Patient taking differently: Take 17 g by mouth daily as needed for moderate constipation. 12/25/22   Marrianne Mood, MD  senna (SENOKOT) 8.6 MG TABS tablet Take 1 tablet (8.6 mg total) by mouth daily as needed for mild constipation. 03/02/23   Kathleen Lime, MD  senna-docusate (SENOKOT-S) 8.6-50 MG tablet Take 1 tablet by mouth at bedtime. 12/25/22   Marrianne Mood, MD  sertraline (ZOLOFT) 25 MG tablet Take 25 mg by mouth daily. 03/12/23   [provider]  Ubrogepant (UBRELVY) 50 MG TABS Take 1 tablet (50 mg total) by mouth as needed (migraine headache). if needed a second dose may be taken at least 2 hours after the initial dose; MAX 8 doses in a month 01/25/23   Quincy Simmonds, MD      Allergies    Patient has no known allergies.    Review of Systems   Review of Systems  Physical Exam Updated Vital Signs BP (!) 169/99   Pulse (!) 107   Temp (!) 97.4 F (36.3 C) (Temporal)   Resp 16   SpO2 100%  Physical Exam Vitals and nursing note reviewed.  Constitutional:      Appearance: She is well-developed. She is ill-appearing.  HENT:     Head: Normocephalic and atraumatic.      Right Ear: External ear normal.     Left Ear: External ear normal.  Eyes:     General: No scleral icterus.       Right eye: No discharge.        Left eye: No discharge.     Conjunctiva/sclera: Conjunctivae normal.  Neck:     Trachea: No tracheal deviation.  Cardiovascular:     Rate and Rhythm: Normal rate and regular rhythm.  Pulmonary:     Effort: Pulmonary effort is normal. No respiratory distress.     Breath sounds: Normal breath sounds. No stridor. No wheezing or rales.  Abdominal:     General: Bowel sounds are normal. There is no distension.     Palpations: Abdomen is soft.     Tenderness: There is no abdominal tenderness. There is no guarding or  rebound.  Musculoskeletal:        General: No tenderness or deformity.     Cervical back: Neck supple.  Skin:    General: Skin is warm and dry.     Findings: No rash.  Neurological:     General: No focal deficit present.     Mental Status: She is alert.     GCS: GCS eye subscore is 3. GCS verbal subscore is 4. GCS motor subscore is 5.     Cranial Nerves: No dysarthria or facial asymmetry.     Motor: No abnormal muscle tone or seizure activity.  Psychiatric:        Mood and Affect: Mood normal.     ED Results / Procedures / Treatments   Labs (all labs ordered are listed, but only abnormal results are displayed) Labs Reviewed  COMPREHENSIVE METABOLIC PANEL - Abnormal; Notable for the following components:      Result Value   Chloride 96 (*)    BUN 26 (*)    Creatinine, Ser 3.91 (*)    Total Protein 8.9 (*)    AST 14 (*)    GFR, Estimated 14 (*)    Anion gap 18 (*)    All other components within normal limits  CBC WITH DIFFERENTIAL/PLATELET - Abnormal; Notable for the following components:   RBC 3.55 (*)    Hemoglobin 11.7 (*)    HCT 33.3 (*)    RDW 22.0 (*)    Platelets 145 (*)    Basophils Absolute 0.2 (*)    nRBC 1 (*)    All other components within normal limits  CBG MONITORING, ED - Abnormal; Notable for  the following components:   Glucose-Capillary 106 (*)    All other components within normal limits  I-STAT VENOUS BLOOD GAS, ED - Abnormal; Notable for the following components:   pCO2, Ven 43.2 (*)    pO2, Ven 118 (*)    Calcium, Ion 1.09 (*)    All other components within normal limits  HCG, SERUM, QUALITATIVE  AMMONIA  CBC WITH DIFFERENTIAL/PLATELET    EKG EKG Interpretation Date/Time:  Tuesday April 20 2023 12:14:47 EDT Ventricular Rate:  101 PR Interval:  158 QRS Duration:  91 QT Interval:  393 QTC Calculation: 510 R Axis:   92  Text Interpretation: Sinus tachycardia Atrial premature complex Borderline right axis deviation Borderline low voltage, extremity leads Consider left ventricular hypertrophy Borderline prolonged QT interval No significant change since last tracing Confirmed by [name] Dibbles [id number]) on 04/20/2023 12:17:52 PM  Radiology No results found.  Procedures Procedures    Medications Ordered in ED Medications  naloxone [facility]) injection 0.4 mg (0.4 mg Intravenous Given 04/20/23 1331)  labetalol (NORMODYNE) injection 20 mg (20 mg Intravenous Given 04/20/23 1332)    ED Course/ Medical Decision Making/ A&P Clinical Course as of 04/20/23 1554  Tue Apr 20, 2023  1350 Patient is now awake and alert sitting up at the bedside.  She is answering questions.  Denies any complaints. [JK]  1526 CBC and metabolic panel unremarkable.  Consistent with chronic kidney disease ammonia level normal. [JK]  1554 CT scan without acute findings [JK]    Clinical Course User Index [JK] [name] Dibbles, MD                                 Medical Decision Making Problems Addressed: Altered mental status, unspecified altered mental status type: acute illness  or injury  Amount and/or Complexity of Data Reviewed Labs: ordered. Decision-making details documented in ED Course. Radiology: ordered and independent interpretation performed.  Risk Prescription drug  management.   Patient presented to the ED for evaluation of transient altered mental status.  Patient does have prescriptions for opiates on PDMP.  Patient's mental status now completely back to baseline.  Vital signs are reassuring.  Labs are unremarkable.  No signs of elevated pneumonia or severe electrolyte abnormalities.  CT scan is currently pending.  If CT scan does not show any acute process it is possible this may have been some type of medication interaction.  Low suspicion for stroke TIA at this point.  Care turned over at shift change pending CT scan.  CT scan is negative.  Discussed the findings with the patient.  She is ready for discharge       Final Clinical Impression(s) / ED Diagnoses Final diagnoses:  None    Rx / DC Orders ED Discharge Orders     None         [name] Dibbles, MD 04/20/23 1530    [name] Dibbles, MD 04/20/23 1610

## 2023-04-20 NOTE — ED Triage Notes (Signed)
Pt BIB EMS from dialysis. After completing tx she became altered, hx of taking pain meds before dialysis.

## 2023-04-21 ENCOUNTER — Telehealth: Payer: Self-pay

## 2023-04-21 ENCOUNTER — Other Ambulatory Visit: Payer: Self-pay

## 2023-04-21 ENCOUNTER — Telehealth: Payer: Self-pay | Admitting: *Deleted

## 2023-04-21 ENCOUNTER — Encounter (HOSPITAL_COMMUNITY): Payer: Self-pay

## 2023-04-21 ENCOUNTER — Encounter (HOSPITAL_COMMUNITY)
Admission: RE | Disposition: A | Discharge status: Home / Self Care | Payer: Self-pay | Source: Ambulatory Visit | Attending: Vascular Surgery

## 2023-04-21 ENCOUNTER — Other Ambulatory Visit (HOSPITAL_COMMUNITY): Payer: Self-pay

## 2023-04-21 ENCOUNTER — Ambulatory Visit (HOSPITAL_COMMUNITY)
Admission: RE | Admit: 2023-04-21 | Discharge: 2023-04-21 | Disposition: A | Discharge status: Home / Self Care | Payer: 59 | Source: Ambulatory Visit | Attending: Vascular Surgery | Admitting: Vascular Surgery

## 2023-04-21 DIAGNOSIS — T82898A Other specified complication of vascular prosthetic devices, implants and grafts, initial encounter: Secondary | ICD-10-CM | POA: Diagnosis not present

## 2023-04-21 DIAGNOSIS — Z87891 Personal history of nicotine dependence: Secondary | ICD-10-CM | POA: Insufficient documentation

## 2023-04-21 DIAGNOSIS — Z7985 Long-term (current) use of injectable non-insulin antidiabetic drugs: Secondary | ICD-10-CM | POA: Diagnosis not present

## 2023-04-21 DIAGNOSIS — Z992 Dependence on renal dialysis: Secondary | ICD-10-CM | POA: Insufficient documentation

## 2023-04-21 DIAGNOSIS — I120 Hypertensive chronic kidney disease with stage 5 chronic kidney disease or end stage renal disease: Secondary | ICD-10-CM | POA: Insufficient documentation

## 2023-04-21 DIAGNOSIS — N186 End stage renal disease: Secondary | ICD-10-CM | POA: Insufficient documentation

## 2023-04-21 DIAGNOSIS — Z89611 Acquired absence of right leg above knee: Secondary | ICD-10-CM | POA: Insufficient documentation

## 2023-04-21 DIAGNOSIS — N185 Chronic kidney disease, stage 5: Secondary | ICD-10-CM

## 2023-04-21 DIAGNOSIS — Z794 Long term (current) use of insulin: Secondary | ICD-10-CM | POA: Insufficient documentation

## 2023-04-21 DIAGNOSIS — E1122 Type 2 diabetes mellitus with diabetic chronic kidney disease: Secondary | ICD-10-CM | POA: Insufficient documentation

## 2023-04-21 DIAGNOSIS — Y841 Kidney dialysis as the cause of abnormal reaction of the patient, or of later complication, without mention of misadventure at the time of the procedure: Secondary | ICD-10-CM | POA: Diagnosis not present

## 2023-04-21 HISTORY — PX: A/V SHUNTOGRAM: CATH118297

## 2023-04-21 LAB — GLUCOSE, CAPILLARY
Glucose-Capillary: 149 mg/dL — ABNORMAL HIGH (ref 70–99)
Glucose-Capillary: 140 mg/dL — ABNORMAL HIGH (ref 70–99)

## 2023-04-21 SURGERY — A/V SHUNTOGRAM
Anesthesia: LOCAL | Laterality: Left

## 2023-04-21 MED ORDER — MIDAZOLAM HCL 2 MG/2ML IJ SOLN
INTRAMUSCULAR | Status: DC | PRN
  Administered 2023-04-21: 1 mg via INTRAVENOUS

## 2023-04-21 MED ORDER — HEPARIN (PORCINE) IN NACL 1000-0.9 UT/500ML-% IV SOLN
INTRAVENOUS | Status: DC | PRN
  Administered 2023-04-21 (×2): 500 mL

## 2023-04-21 MED ORDER — BUSPIRONE HCL 5 MG PO TABS: 2.5000 mg | ORAL_TABLET | Freq: Two times a day (BID) | ORAL | 2 refills | Status: AC

## 2023-04-21 MED ORDER — LIDOCAINE HCL (PF) 1 % IJ SOLN
INTRAMUSCULAR | Status: AC
  Filled 2023-04-21: qty 30

## 2023-04-21 MED ORDER — FENTANYL CITRATE (PF) 100 MCG/2ML IJ SOLN
INTRAMUSCULAR | Status: DC | PRN
  Administered 2023-04-21: 50 ug via INTRAVENOUS

## 2023-04-21 MED ORDER — MIDAZOLAM HCL 2 MG/2ML IJ SOLN
INTRAMUSCULAR | Status: AC
  Filled 2023-04-21: qty 2

## 2023-04-21 MED ORDER — FENTANYL CITRATE (PF) 100 MCG/2ML IJ SOLN
INTRAMUSCULAR | Status: AC
  Filled 2023-04-21: qty 2

## 2023-04-21 MED ORDER — IODIXANOL 320 MG/ML IV SOLN
INTRAVENOUS | Status: DC | PRN
  Administered 2023-04-21: 20 mL

## 2023-04-21 MED ORDER — LIDOCAINE HCL (PF) 1 % IJ SOLN
INTRAMUSCULAR | Status: DC | PRN
  Administered 2023-04-21: 5 mL

## 2023-04-21 SURGICAL SUPPLY — 8 items
COVER DOME SNAP 22 D (MISCELLANEOUS) ×1 IMPLANT
KIT MICROPUNCTURE NIT STIFF (SHEATH) IMPLANT
KIT SYRINGE INJ CVI SPIKEX1 (MISCELLANEOUS) IMPLANT
SET ATX-X65L (MISCELLANEOUS) IMPLANT
SHEATH PROBE COVER 6X72 (BAG) ×1 IMPLANT
STOPCOCK MORSE 400PSI 3WAY (MISCELLANEOUS) ×1 IMPLANT
TRAY PV CATH (CUSTOM PROCEDURE TRAY) ×1 IMPLANT
TUBING CIL FLEX 10 FLL-RA (TUBING) ×1 IMPLANT

## 2023-04-21 NOTE — H&P (Signed)
Office Note   Patient seen and examined in preop holding.  No complaints. No changes to medication history or physical exam since last seen in clinic. After discussing the risks and benefits of fistulagram, [name] elected to proceed.   Victorino Sparrow MD   CC:  follow up Requesting Provider:  No ref. provider found  HPI: Kathy Frank is a 47 y.o. (September 06, 1975) female who presents for evaluation of left arm AV graft due to difficulty cannulating.  She underwent left AV graft placement in [place] earlier this year.  She required complex debridement with complex skin closure with Dr. Karin Lieu on 12/31/2022.  She still has a right IJ TDC.  Based on the referral notes from nephrology, they would like left arm graft evaluated prior to removing TDC due to cannulating issues that are ongoing.  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 Randie Heinz on 02/19/2023.  She sustained a fall on her right AKA and was evaluated last month.  She is ready for staple removal.  She claims to wash her incision with Dial soap on a daily basis.  She states she has some drainage from mid incision.   Past Medical History:  Diagnosis Date   Analgesic overuse headache 06/28/2021   Anemia, posthemorrhagic, acute 10/14/2022   Anxiety    CAD (coronary artery disease)    CAP (community acquired pneumonia) 09/29/2022   CVA (cerebral vascular accident) [facility])    Depression 06/28/2006   Qualifier: Diagnosis of  By: Laveda Abbe MD, Todd     Diabetes mellitus type 2 in obese 06/28/1994   Dyspnea    uses oxygeb 2 liters per minute at dialysis   End stage renal disease on dialysis [facility])    on hemodialysis T/Th/Sat   Erosive esophagitis    Esophageal reflux    Eye redness    Fecal incontinence 02/04/2018   Gastroparesis    ? diabetic   GERD (gastroesophageal reflux disease)    Human immunodeficiency virus (HIV) disease (HCC) 04/23/2016   Hyperkalemia 10/12/2022   Hyperlipidemia     Hypertension    Hypertensive emergency 11/09/2021   Hypertensive urgency 05/20/2021   Metabolic bone disease 05/02/2019   Moderate nonproliferative diabetic retinopathy of both eyes (HCC) 11/21/2014   11/14/14: Noted on retinal imaging; needs follow-up imaging in 6 months  05/22/16: Noted on retinal imaging again; needs follow-up imaging in 6 months   PAD (peripheral artery disease) (HCC)    Type 2 diabetes mellitus with diabetic peripheral angiopathy without gangrene (HCC) 05/01/2019   Wound infection s/p L transmetatarsal amputation     Past Surgical History:  Procedure Laterality Date   ABDOMINAL AORTOGRAM W/LOWER EXTREMITY N/A 12/13/2020   Procedure: ABDOMINAL AORTOGRAM W/LOWER EXTREMITY;  Surgeon: Chuck Hint, MD;  Location: [facility] INVASIVE CV LAB;  Service: Cardiovascular;  Laterality: N/A;   AMPUTATION Left 07/08/2018   Procedure: AMPUTATION FORTH RAY LEFT FOOT;  Surgeon: Nadara Mustard, MD;  Location: [facility] OR;  Service: Orthopedics;  Laterality: Left;   AMPUTATION Left 08/09/2018   Procedure: Left Transmetatarsal Amputation;  Surgeon: Nadara Mustard, MD;  Location: [facility] OR;  Service: Orthopedics;  Laterality: Left;   AMPUTATION Left 10/08/2018   Procedure: LEFT BELOW KNEE AMPUTATION;  Surgeon: Nadara Mustard, MD;  Location: [facility] OR;  Service: Orthopedics;  Laterality: Left;   AMPUTATION Left 10/28/2018   Procedure: REVISION BELOW KNEE AMPUTATION;  Surgeon: Nadara Mustard, MD;  Location: [facility] OR;  Service: Orthopedics;  Laterality: Left;   AMPUTATION Left 12/16/2018   Procedure: LEFT ABOVE KNEE AMPUTATION;  Surgeon: Nadara Mustard, MD;  Location: [facility] OR;  Service: Orthopedics;  Laterality: Left;   AMPUTATION Right 01/31/2021   Procedure: AMPUTATION BELOW KNEE;  Surgeon: Nada Libman, MD;  Location: [facility] OR;  Service: Vascular;  Laterality: Right;   AMPUTATION Right 03/28/2021   Procedure: REVISION OF RIGHT BELOW KNEE AMPUTATION;  Surgeon: Nada Libman, MD;  Location: [facility] OR;  Service:  Vascular;  Laterality: Right;   AMPUTATION Right 04/18/2021   Procedure: right below knee amputation/washout placement wound vac;  Surgeon: Leonie Douglas, MD;  Location: [facility] OR;  Service: Vascular;  Laterality: Right;   AMPUTATION Right 02/19/2023   Procedure: CONVERSION TO ABOVE KNEE AMPUTATION;  Surgeon: Maeola Harman, MD;  Location: [facility] OR;  Service: Vascular;  Laterality: Right;   APPLICATION OF WOUND VAC Left 12/31/2022   Procedure: APPLICATION OF PREVENA WOUND VAC;  Surgeon: Victorino Sparrow, MD;  Location: [facility] OR;  Service: Vascular;  Laterality: Left;   AV FISTULA PLACEMENT Left 10/14/2018   Procedure: Arteriovenous (Av) Fistula Creation Left Arm;  Surgeon: Cephus Shelling, MD;  Location: [facility] OR;  Service: Vascular;  Laterality: Left;   BASCILIC VEIN TRANSPOSITION Left 04/14/2019   Procedure: BASILIC VEIN TRANSPOSITION SECOND STAGE LEFT ARM;  Surgeon: Larina Earthly, MD;  Location: [facility] OR;  Service: Vascular;  Laterality: Left;   BIOPSY  10/14/2022   Procedure: BIOPSY;  Surgeon: Benancio Deeds, MD;  Location: [facility] ENDOSCOPY;  Service: Gastroenterology;;   COMPLEX WOUND CLOSURE Left 12/31/2022   Procedure: COMPLEX WOUND CLOSURE;  Surgeon: Victorino Sparrow, MD;  Location: [facility] OR;  Service: Vascular;  Laterality: Left;   CORONARY STENT INTERVENTION N/A 05/21/2021   Procedure: CORONARY STENT INTERVENTION;  Surgeon: Marykay Lex, MD;  Location: [facility] INVASIVE CV LAB;  Service: Cardiovascular;  Laterality: N/A;   ESOPHAGOGASTRODUODENOSCOPY (EGD) WITH PROPOFOL N/A 10/14/2022   Procedure: ESOPHAGOGASTRODUODENOSCOPY (EGD) WITH PROPOFOL;  Surgeon: Benancio Deeds, MD;  Location: [facility] ENDOSCOPY;  Service: Gastroenterology;  Laterality: N/A;   HEMOSTASIS CLIP PLACEMENT  10/14/2022   Procedure: HEMOSTASIS CLIP PLACEMENT;  Surgeon: Benancio Deeds, MD;  Location: [facility] ENDOSCOPY;  Service: Gastroenterology;;   INCISION AND DRAINAGE Left 12/31/2022   Procedure: INCISION AND DRAINAGE OF LEFT  ARM;  Surgeon: Victorino Sparrow, MD;  Location: [facility] OR;  Service: Vascular;  Laterality: Left;   INCISION AND DRAINAGE ABSCESS N/A 10/24/2020   Procedure: exicision of hydradenitis;  Surgeon: Romie Levee, MD;  Location: [facility];  Service: General;  Laterality: N/A;  45 min   INSERTION OF DIALYSIS CATHETER Right 04/14/2019   Procedure: INSERTION OF DIALYSIS CATHETER;  Surgeon: Larina Earthly, MD;  Location: [facility] OR;  Service: Vascular;  Laterality: Right;   IR FLUORO GUIDE CV LINE RIGHT  10/13/2022   IR REMOVAL TUN CV CATH W/O FL  10/16/2022   IR US GUIDE VASC ACCESS RIGHT  10/13/2022   LEFT HEART CATH AND CORONARY ANGIOGRAPHY N/A 05/21/2021   Procedure: LEFT HEART CATH AND CORONARY ANGIOGRAPHY;  Surgeon: Marykay Lex, MD;  Location: [facility] INVASIVE CV LAB;  Service: Cardiovascular;  Laterality: N/A;   LOWER EXTREMITY ANGIOGRAPHY N/A 07/05/2018   Procedure: LOWER EXTREMITY ANGIOGRAPHY;  Surgeon: Nada Libman, MD;  Location: [facility] INVASIVE CV LAB;  Service: Cardiovascular;  Laterality: N/A;   PERIPHERAL VASCULAR ATHERECTOMY Right 12/13/2020   Procedure: PERIPHERAL VASCULAR ATHERECTOMY;  Surgeon: Chuck Hint, MD;  Location: [facility] INVASIVE CV LAB;  Service: Cardiovascular;  Laterality: Right;  Superficial femoral   PERIPHERAL VASCULAR BALLOON ANGIOPLASTY Left 07/05/2018   Procedure: PERIPHERAL VASCULAR BALLOON ANGIOPLASTY;  Surgeon: Nada Libman, MD;  Location: [facility] INVASIVE CV LAB;  Service: Cardiovascular;  Laterality: Left;  SFA   PERIPHERAL VASCULAR BALLOON ANGIOPLASTY Right 12/13/2020   Procedure: PERIPHERAL VASCULAR BALLOON ANGIOPLASTY;  Surgeon: Chuck Hint, MD;  Location: [facility] INVASIVE CV LAB;  Service: Cardiovascular;  Laterality: Right;  Peroneal artery, anterior tibial artery   RECTAL EXAM UNDER ANESTHESIA N/A 10/24/2020   Procedure: EXAM UNDER ANESTHESIA;  Surgeon: Romie Levee, MD;  [facility]: [facility];  Service: General;  Laterality: N/A;    STUMP REVISION Left 10/19/2018   Procedure: REVISION LEFT BELOW KNEE AMPUTATION;  Surgeon: Nadara Mustard, MD;  Location: [facility] OR;  Service: Orthopedics;  Laterality: Left;   TUBAL LIGATION  2002    Social History   Socioeconomic History   Marital status: Single    Spouse name: Not on file   Number of children: Not on file   Years of education: 10   Highest education level: Not on file  Occupational History    Employer: UNEMPLOYED  Tobacco Use   Smoking status: Former    Current packs/day: 0.00    Average packs/day: 0.1 packs/day for 10.0 years (1.0 ttl pk-yrs)    Types: Cigarettes    Start date: 11/17/2013    Quit date: 09/09/2014    Years since quitting: 8.6   Smokeless tobacco: Never  Vaping Use   Vaping status: Never Used  Substance and Sexual Activity   Alcohol use: Not Currently   Drug use: Not Currently    Types: Marijuana    Comment: 2 times a monthy -  `last time [place] 2022   Sexual activity: Not Currently    Partners: Female, Female  Other Topics Concern   Not on file  Social History Narrative   Not on file   Social Determinants of Health   Financial Resource Strain: Low Risk  (10/19/2022)   Overall Financial Resource Strain (CARDIA)    Difficulty of Paying Living Expenses: Not hard at all  Food Insecurity: No Food Insecurity (02/15/2023)   Hunger Vital Sign    Worried About Running Out of Food in the Last Year: Never true    Ran Out of Food in the Last Year: Never true  Transportation Needs: No Transportation Needs (02/15/2023)   PRAPARE - Administrator, Civil Service (Medical): No    Lack of Transportation (Non-Medical): No  Recent Concern: Transportation Needs - Unmet Transportation Needs (11/30/2022)   [facility] from [facility]   PRAPARE - Transportation    Lack of Transportation (Medical): Not on file    Lack of Transportation (Non-Medical): Yes  Physical Activity: Inactive  (10/19/2022)   Exercise Vital Sign    Days of Exercise per Week: 0 days    Minutes of Exercise per Session: 0 min  Stress: Stress Concern Present (10/19/2022)   [employer] of Occupational Health - Occupational Stress Questionnaire    Feeling of Stress : Very much  Social Connections: Socially Isolated (10/19/2022)   Social Connection and Isolation Panel [NHANES]    Frequency of Communication with Friends and Family: More than three times a week    Frequency of Social Gatherings with Friends and Family: Never    Attends Religious Services: Never    Active Member of  Clubs or Organizations: No    Attends Banker Meetings: Never    Marital Status: Never married  Intimate Partner Violence: Not At Risk (02/15/2023)   Humiliation, Afraid, Rape, and Kick questionnaire    Fear of Current or Ex-Partner: No    Emotionally Abused: No    Physically Abused: No    Sexually Abused: No    Family History  Problem Relation Age of Onset   Diabetes Mother    Diabetes Brother    Diabetes Daughter    Diabetes Daughter    Mental retardation Brother        died from PNA   Diabetes Maternal Grandmother     Current Facility-Administered Medications  Medication Dose Route Frequency Provider Last Rate Last Admin   0.9 %  sodium chloride infusion  250 mL Intravenous PRN Victorino Sparrow, MD       sodium chloride flush (NS) 0.9 % injection 3 mL  3 mL Intravenous Q12H Victorino Sparrow, MD       sodium chloride flush (NS) 0.9 % injection 3 mL  3 mL Intravenous PRN Victorino Sparrow, MD       Current Outpatient Medications  Medication Sig Dispense Refill   amLODipine-olmesartan (AZOR) 10-40 MG tablet Take 1 tablet by mouth every evening.     atorvastatin (LIPITOR) 80 MG tablet Take 1 tablet (80 mg total) by mouth daily. 30 tablet 11   bictegravir-emtricitabine-tenofovir AF (BIKTARVY) 50-200-25 MG TABS tablet Take 1 tablet by mouth daily. OVERDUE FOR OFFICE FOLLOW UP - PLEASE CALL OUR OFFICE  TO SCHEDULE [phone] 30 tablet 5   calcitRIOL (ROCALTROL) 0.25 MCG capsule Take 5 capsules (1.25 mcg total) by mouth Every Tuesday,Thursday,and Saturday with dialysis. 60 capsule 0   calcium acetate (PHOSLO) 667 MG capsule Take 2 capsules (1,334 mg total) by mouth 3 (three) times daily with meals. 180 capsule 0   cyclobenzaprine (FLEXERIL) 5 MG tablet Take 5 mg by mouth 2 (two) times daily as needed for muscle spasms.     Darbepoetin Alfa (ARANESP) 200 MCG/0.4ML SOSY injection Inject 0.4 mLs (200 mcg total) into the skin every Monday at 6 PM. (Patient taking differently: Inject 200 mcg into the skin every 7 (seven) days. Every Monday) 1.68 mL    escitalopram (LEXAPRO) 5 MG tablet Take 1 tablet (5 mg total) by mouth daily. 30 tablet 2   gabapentin (NEURONTIN) 100 MG capsule Take 100 mg by mouth at bedtime.     insulin glargine (LANTUS SOLOSTAR) 100 UNIT/ML Solostar Pen Inject 15 Units into the skin at bedtime. 6 mL 4   lidocaine (LIDODERM) 5 % Place 1 patch onto the skin daily. Remove & Discard patch within 12 hours or as directed by MD (Patient taking differently: Place 1 patch onto the skin daily as needed (pain). Remove & Discard patch within 12 hours or as directed by MD) 14 patch 0   liraglutide (VICTOZA) 18 MG/3ML SOPN Inject 0.6 mg into the skin daily. 3 mL 11   LOKELMA 5 g packet Take 5 g by mouth daily.     multivitamin (RENA-VIT) TABS tablet Take 1 tablet by mouth daily. (Patient taking differently: Take 1 tablet by mouth See admin instructions. On Tuesday, Wednesday and Thursday) 30 tablet 0   pantoprazole (PROTONIX) 40 MG tablet Take 1 tablet (40 mg total) by mouth 2 (two) times daily. 60 tablet 2   polyethylene glycol (MIRALAX / GLYCOLAX) 17 g packet Take 17 g by mouth 2 (two) times  daily. (Patient taking differently: Take 17 g by mouth daily as needed for moderate constipation.) 14 each 0   senna (SENOKOT) 8.6 MG TABS tablet Take 1 tablet (8.6 mg total) by mouth daily as needed for  mild constipation. 120 tablet 0   senna-docusate (SENOKOT-S) 8.6-50 MG tablet Take 1 tablet by mouth at bedtime. 30 tablet 2   Ubrogepant (UBRELVY) 50 MG TABS Take 1 tablet (50 mg total) by mouth as needed (migraine headache). if needed a second dose may be taken at least 2 hours after the initial dose; MAX 8 doses in a month 8 tablet 3   Accu-Chek Softclix Lancets lancets Use as instructed 100 each 12   albuterol (VENTOLIN HFA) 108 (90 Base) MCG/ACT inhaler Inhale 2 puffs into the lungs every 4 (four) hours as needed for wheezing or shortness of breath. 8.5 g 0   aspirin EC (EQ ASPIRIN ADULT LOW DOSE) 81 MG tablet Take 1 tablet (81 mg total) by mouth daily. Swallow whole. 30 tablet 3   busPIRone (BUSPAR) 5 MG tablet Take 0.5 tablets (2.5 mg total) by mouth 2 (two) times daily. 30 tablet 2   Continuous Blood Gluc Sensor (DEXCOM G7 SENSOR) MISC Use to check blood sugar continuously 10 each 6   Continuous Glucose Receiver (DEXCOM G7 RECEIVER) DEVI One box 1 each 0   glucose blood test strip Use as instructed 100 each 12   hydrocortisone-pramoxine (PROCTOFOAM-HC) rectal foam Place 1 applicator rectally 2 (two) times daily. (Patient not taking: Reported on 04/16/2023) 10 g 1   Insulin Pen Needle (PENTIPS) 32G X 4 MM MISC Use as directed 100 each 3   ondansetron (ZOFRAN) 8 MG tablet Take 1 tablet (8 mg total) by mouth every 8 (eight) hours as needed for nausea or vomiting. 12 tablet 0   ondansetron (ZOFRAN-ODT) 8 MG disintegrating tablet Take 1 tablet (8 mg total) by mouth every 8 (eight) hours as needed for nausea or vomiting. (Patient not taking: Reported on 04/16/2023) 10 tablet 0   sertraline (ZOLOFT) 25 MG tablet Take 25 mg by mouth daily.      No Known Allergies   REVIEW OF SYSTEMS:   [X]  denotes positive finding, [ ]  denotes negative finding Cardiac  Comments:  Chest pain or chest pressure:    Shortness of breath upon exertion:    Short of breath when lying flat:    Irregular heart rhythm:         Vascular    Pain in calf, thigh, or hip brought on by ambulation:    Pain in feet at night that wakes you up from your sleep:     Blood clot in your veins:    Leg swelling:         Pulmonary    Oxygen at home:    Productive cough:     Wheezing:         Neurologic    Sudden weakness in arms or legs:     Sudden numbness in arms or legs:     Sudden onset of difficulty speaking or slurred speech:    Temporary loss of vision in one eye:     Problems with dizziness:         Gastrointestinal    Blood in stool:     Vomited blood:         Genitourinary    Burning when urinating:     Blood in urine:        Psychiatric  Major depression:         Hematologic    Bleeding problems:    Problems with blood clotting too easily:        Skin    Rashes or ulcers:        Constitutional    Fever or chills:      PHYSICAL EXAMINATION:  Vitals:   04/21/23 0853 04/21/23 0858 04/21/23 0903 04/21/23 0915  BP: (!) 200/85 (!) 191/95 (!) 195/90 (!) 160/99  Pulse: 88 89 88 90  Resp: 17 19 12    Temp:      TempSrc:      SpO2: 98% 100% 100% 98%  Weight:      Height:        General:  WDWN in NAD; vital signs documented above Gait: Not observed HENT: WNL, normocephalic Pulmonary: normal non-labored breathing , without Rales, rhonchi,  wheezing Cardiac: regular HR Abdomen: soft, NT, no masses Skin: without rashes Extremities:   Right AKA with scabbing around stable placement; mid incision with some serosanguineous drainage with manipulation; no firm fluid collection or fluctuant areas; palpable thrill in left arm AV graft Musculoskeletal: no muscle wasting or atrophy  Neurologic: A&O X 3 Psychiatric:  The pt has Normal affect.   Non-Invasive Vascular Imaging:   Left arm dialysis duplex demonstrates widely patent left arm AV graft    ASSESSMENT/PLAN:: 48 y.o. female here for follow up for evaluation of right AKA as well as evaluation of left arm AV graft due to difficulty  cannulating  -Right above-the-knee amputation overall appears to be healing well.  She does have some serosanguineous drainage with manipulation over the mid incision however there are no areas of fluctuance or firm fluid collection.  All staples were removed.  I stressed the importance of once or twice a day cleansing with antibacterial hand soap and water.  She will notify our office if incision fails to completely heal.  -Surgical history also significant for debridement of wounds in the left arm after AV graft placement in [place] earlier this year.  She has healed her incisions.  On exam she has a palpable thrill throughout the graft in her upper arm.  Duplex today demonstrates a widely patent AV graft.  She however has had difficulty cannulating for dialysis.  Patient states her nephrologist does not want to remove the right IJ TDC until the left arm graft has been evaluated.  Patient will be scheduled for shuntogram with possible intervention in the near future on a nondialysis day.  Patient is also aware that she may not have any further options for dialysis access in the left arm should this access fail.  Shuntogram was discussed in detail and she is agreeable to proceed.   Victorino Sparrow, PA-C [facility] [phone]  Clinic MD:   Randie Heinz

## 2023-04-21 NOTE — Patient Outreach (Signed)
  Care Coordination   04/21/2023 Name: Kathy Frank MRN: [id number] DOB: Apr 21, 1976   Care Coordination Outreach Attempts:  An unsuccessful telephone outreach was attempted today to offer the patient information about available care coordination services.  Follow Up Plan:  Additional outreach attempts will be made to offer the patient care coordination information and services.   Encounter Outcome:  No Answer   Care Coordination Interventions:  No, not indicated    Juanell Fairly RN, BSN, [facility] [employer]er/designer Phone: [phone]

## 2023-04-21 NOTE — Telephone Encounter (Signed)
Patient called regarding rx for Buspar, patient stated she never received the medication. It looks like the rx wasn't sent to the pharmacy. Patient is requesting the rx to be sent to [facility] pharmacy on [address].

## 2023-04-21 NOTE — Progress Notes (Signed)
Patient was given discharge instructions. She verbalized understanding. 

## 2023-04-21 NOTE — Telephone Encounter (Signed)
Looks like previous appointment, provider wrote the BuSpar wrong for clinic administered medication versus a prescription.  Will be sending prescription for medication.

## 2023-04-21 NOTE — Op Note (Signed)
    Patient name: Kathy Frank MRN: [id number] DOB: Jul 07, 1976 Sex: female  04/21/2023 Pre-operative Diagnosis: Left arm AV graft malfunction Post-operative diagnosis:  Same Surgeon:  Victorino Sparrow, MD Procedure Performed: 1.  Micropuncture access of the left AV graft 2.  Fistulagram 3.  Moderate sedation time 12 minutes   Indications: Patient is a 47 year old female with previous history of left arm fistula, with revision to AV graft.  She has had difficulty with cannulation at dialysis.  Once cannulated, the graft works appropriately.  After discussing risk and benefits of left arm fistulogram, Ilhan elected to proceed.  Findings: Widely patent arterial anastomosis, no flow-limiting stenosis throughout the left arm fistula/graft.   Procedure:  The patient was identified in the holding area and taken to room 8.  The patient was then placed supine on the table and prepped and draped in the usual sterile fashion.  A time out was called.  Ultrasound was used to evaluate the left arm AV graft.  This was accessed using a micropuncture needle.  A micro puncture sheath was placed and left arm fistulogram followed.  See results above.  Venotomy managed with Monocryl suture.  Impression: No flow-limiting stenosis identified in the left arm AV graft.  The graft is tunneled laterally.  There is an excellent thrill.  The graft should be usable without issue.     Victorino Sparrow MD [facility] [place] Office: [phone]

## 2023-04-22 ENCOUNTER — Encounter (HOSPITAL_COMMUNITY): Payer: Self-pay

## 2023-04-22 ENCOUNTER — Other Ambulatory Visit (HOSPITAL_COMMUNITY): Payer: Self-pay

## 2023-04-22 DIAGNOSIS — Z992 Dependence on renal dialysis: Secondary | ICD-10-CM | POA: Diagnosis not present

## 2023-04-22 DIAGNOSIS — L299 Pruritus, unspecified: Secondary | ICD-10-CM | POA: Diagnosis not present

## 2023-04-22 DIAGNOSIS — D689 Coagulation defect, unspecified: Secondary | ICD-10-CM | POA: Diagnosis not present

## 2023-04-22 DIAGNOSIS — N2581 Secondary hyperparathyroidism of renal origin: Secondary | ICD-10-CM | POA: Diagnosis not present

## 2023-04-22 DIAGNOSIS — R197 Diarrhea, unspecified: Secondary | ICD-10-CM | POA: Diagnosis not present

## 2023-04-22 DIAGNOSIS — D631 Anemia in chronic kidney disease: Secondary | ICD-10-CM | POA: Diagnosis not present

## 2023-04-22 DIAGNOSIS — T8249XA Other complication of vascular dialysis catheter, initial encounter: Secondary | ICD-10-CM | POA: Diagnosis not present

## 2023-04-22 DIAGNOSIS — N186 End stage renal disease: Secondary | ICD-10-CM | POA: Diagnosis not present

## 2023-04-23 ENCOUNTER — Encounter (HOSPITAL_COMMUNITY): Payer: Self-pay | Admitting: Vascular Surgery

## 2023-04-23 DIAGNOSIS — Z992 Dependence on renal dialysis: Secondary | ICD-10-CM | POA: Diagnosis not present

## 2023-04-23 DIAGNOSIS — N186 End stage renal disease: Secondary | ICD-10-CM | POA: Diagnosis not present

## 2023-04-23 DIAGNOSIS — I120 Hypertensive chronic kidney disease with stage 5 chronic kidney disease or end stage renal disease: Secondary | ICD-10-CM | POA: Diagnosis not present

## 2023-04-23 DIAGNOSIS — T8743 Infection of amputation stump, right lower extremity: Secondary | ICD-10-CM | POA: Diagnosis not present

## 2023-04-23 DIAGNOSIS — I2510 Atherosclerotic heart disease of native coronary artery without angina pectoris: Secondary | ICD-10-CM | POA: Diagnosis not present

## 2023-04-23 DIAGNOSIS — N2581 Secondary hyperparathyroidism of renal origin: Secondary | ICD-10-CM | POA: Diagnosis not present

## 2023-04-23 DIAGNOSIS — E440 Moderate protein-calorie malnutrition: Secondary | ICD-10-CM | POA: Diagnosis not present

## 2023-04-23 DIAGNOSIS — K250 Acute gastric ulcer with hemorrhage: Secondary | ICD-10-CM | POA: Diagnosis not present

## 2023-04-23 DIAGNOSIS — E1143 Type 2 diabetes mellitus with diabetic autonomic (poly)neuropathy: Secondary | ICD-10-CM | POA: Diagnosis not present

## 2023-04-23 DIAGNOSIS — Z89612 Acquired absence of left leg above knee: Secondary | ICD-10-CM | POA: Diagnosis not present

## 2023-04-23 DIAGNOSIS — Z794 Long term (current) use of insulin: Secondary | ICD-10-CM | POA: Diagnosis not present

## 2023-04-23 DIAGNOSIS — E1151 Type 2 diabetes mellitus with diabetic peripheral angiopathy without gangrene: Secondary | ICD-10-CM | POA: Diagnosis not present

## 2023-04-23 DIAGNOSIS — E1122 Type 2 diabetes mellitus with diabetic chronic kidney disease: Secondary | ICD-10-CM | POA: Diagnosis not present

## 2023-04-23 DIAGNOSIS — I700 Atherosclerosis of aorta: Secondary | ICD-10-CM | POA: Diagnosis not present

## 2023-04-23 DIAGNOSIS — D631 Anemia in chronic kidney disease: Secondary | ICD-10-CM | POA: Diagnosis not present

## 2023-04-23 DIAGNOSIS — B965 Pseudomonas (aeruginosa) (mallei) (pseudomallei) as the cause of diseases classified elsewhere: Secondary | ICD-10-CM | POA: Diagnosis not present

## 2023-04-23 DIAGNOSIS — Z89611 Acquired absence of right leg above knee: Secondary | ICD-10-CM | POA: Diagnosis not present

## 2023-04-23 DIAGNOSIS — E1169 Type 2 diabetes mellitus with other specified complication: Secondary | ICD-10-CM | POA: Diagnosis not present

## 2023-04-23 DIAGNOSIS — G43109 Migraine with aura, not intractable, without status migrainosus: Secondary | ICD-10-CM | POA: Diagnosis not present

## 2023-04-23 DIAGNOSIS — K3184 Gastroparesis: Secondary | ICD-10-CM | POA: Diagnosis not present

## 2023-04-23 DIAGNOSIS — E113393 Type 2 diabetes mellitus with moderate nonproliferative diabetic retinopathy without macular edema, bilateral: Secondary | ICD-10-CM | POA: Diagnosis not present

## 2023-04-24 DIAGNOSIS — R197 Diarrhea, unspecified: Secondary | ICD-10-CM | POA: Diagnosis not present

## 2023-04-24 DIAGNOSIS — D631 Anemia in chronic kidney disease: Secondary | ICD-10-CM | POA: Diagnosis not present

## 2023-04-24 DIAGNOSIS — L299 Pruritus, unspecified: Secondary | ICD-10-CM | POA: Diagnosis not present

## 2023-04-24 DIAGNOSIS — N186 End stage renal disease: Secondary | ICD-10-CM | POA: Diagnosis not present

## 2023-04-24 DIAGNOSIS — T8249XA Other complication of vascular dialysis catheter, initial encounter: Secondary | ICD-10-CM | POA: Diagnosis not present

## 2023-04-24 DIAGNOSIS — Z992 Dependence on renal dialysis: Secondary | ICD-10-CM | POA: Diagnosis not present

## 2023-04-24 DIAGNOSIS — D689 Coagulation defect, unspecified: Secondary | ICD-10-CM | POA: Diagnosis not present

## 2023-04-24 DIAGNOSIS — N2581 Secondary hyperparathyroidism of renal origin: Secondary | ICD-10-CM | POA: Diagnosis not present

## 2023-04-26 ENCOUNTER — Observation Stay (HOSPITAL_COMMUNITY)
Admission: EM | Admit: 2023-04-26 | Discharge: 2023-04-29 | Disposition: A | Discharge status: Home Health | Payer: 59 | Attending: Internal Medicine | Admitting: Internal Medicine

## 2023-04-26 ENCOUNTER — Emergency Department (HOSPITAL_COMMUNITY): Payer: 59

## 2023-04-26 DIAGNOSIS — N186 End stage renal disease: Secondary | ICD-10-CM

## 2023-04-26 DIAGNOSIS — E875 Hyperkalemia: Secondary | ICD-10-CM | POA: Diagnosis not present

## 2023-04-26 DIAGNOSIS — G8190 Hemiplegia, unspecified affecting unspecified side: Secondary | ICD-10-CM | POA: Diagnosis not present

## 2023-04-26 DIAGNOSIS — I2510 Atherosclerotic heart disease of native coronary artery without angina pectoris: Secondary | ICD-10-CM | POA: Diagnosis not present

## 2023-04-26 DIAGNOSIS — B20 Human immunodeficiency virus [HIV] disease: Secondary | ICD-10-CM | POA: Diagnosis present

## 2023-04-26 DIAGNOSIS — Z8673 Personal history of transient ischemic attack (TIA), and cerebral infarction without residual deficits: Secondary | ICD-10-CM | POA: Diagnosis not present

## 2023-04-26 DIAGNOSIS — Z794 Long term (current) use of insulin: Secondary | ICD-10-CM | POA: Insufficient documentation

## 2023-04-26 DIAGNOSIS — E1169 Type 2 diabetes mellitus with other specified complication: Secondary | ICD-10-CM | POA: Diagnosis present

## 2023-04-26 DIAGNOSIS — I6381 Other cerebral infarction due to occlusion or stenosis of small artery: Secondary | ICD-10-CM | POA: Diagnosis not present

## 2023-04-26 DIAGNOSIS — R29818 Other symptoms and signs involving the nervous system: Secondary | ICD-10-CM | POA: Diagnosis not present

## 2023-04-26 DIAGNOSIS — E1122 Type 2 diabetes mellitus with diabetic chronic kidney disease: Secondary | ICD-10-CM | POA: Insufficient documentation

## 2023-04-26 DIAGNOSIS — R4781 Slurred speech: Secondary | ICD-10-CM | POA: Diagnosis not present

## 2023-04-26 DIAGNOSIS — Z7902 Long term (current) use of antithrombotics/antiplatelets: Secondary | ICD-10-CM | POA: Diagnosis not present

## 2023-04-26 DIAGNOSIS — K529 Noninfective gastroenteritis and colitis, unspecified: Secondary | ICD-10-CM | POA: Diagnosis not present

## 2023-04-26 DIAGNOSIS — Z89512 Acquired absence of left leg below knee: Secondary | ICD-10-CM | POA: Insufficient documentation

## 2023-04-26 DIAGNOSIS — E11319 Type 2 diabetes mellitus with unspecified diabetic retinopathy without macular edema: Secondary | ICD-10-CM | POA: Diagnosis not present

## 2023-04-26 DIAGNOSIS — R2990 Unspecified symptoms and signs involving the nervous system: Principal | ICD-10-CM | POA: Diagnosis present

## 2023-04-26 DIAGNOSIS — Z992 Dependence on renal dialysis: Secondary | ICD-10-CM | POA: Insufficient documentation

## 2023-04-26 DIAGNOSIS — R569 Unspecified convulsions: Secondary | ICD-10-CM | POA: Diagnosis not present

## 2023-04-26 DIAGNOSIS — R29810 Facial weakness: Secondary | ICD-10-CM | POA: Insufficient documentation

## 2023-04-26 DIAGNOSIS — R531 Weakness: Secondary | ICD-10-CM | POA: Diagnosis not present

## 2023-04-26 DIAGNOSIS — Z7982 Long term (current) use of aspirin: Secondary | ICD-10-CM | POA: Diagnosis not present

## 2023-04-26 DIAGNOSIS — E119 Type 2 diabetes mellitus without complications: Secondary | ICD-10-CM | POA: Diagnosis present

## 2023-04-26 DIAGNOSIS — T879 Unspecified complications of amputation stump: Secondary | ICD-10-CM | POA: Diagnosis present

## 2023-04-26 DIAGNOSIS — I10 Essential (primary) hypertension: Secondary | ICD-10-CM | POA: Diagnosis not present

## 2023-04-26 DIAGNOSIS — I639 Cerebral infarction, unspecified: Secondary | ICD-10-CM | POA: Diagnosis not present

## 2023-04-26 LAB — DIFFERENTIAL
Abs Immature Granulocytes: 0.01 10*3/uL (ref 0.00–0.07)
Basophils Absolute: 0.1 10*3/uL (ref 0.0–0.1)
Basophils Relative: 1 %
Eosinophils Absolute: 0.3 10*3/uL (ref 0.0–0.5)
Eosinophils Relative: 5 %
Immature Granulocytes: 0 %
Lymphocytes Relative: 23 %
Lymphs Abs: 1.4 10*3/uL (ref 0.7–4.0)
Monocytes Absolute: 0.4 10*3/uL (ref 0.1–1.0)
Monocytes Relative: 6 %
Neutro Abs: 3.9 10*3/uL (ref 1.7–7.7)
Neutrophils Relative %: 65 %

## 2023-04-26 LAB — CBC
HCT: 37.6 % (ref 36.0–46.0)
Hemoglobin: 11.2 g/dL — ABNORMAL LOW (ref 12.0–15.0)
MCH: 27.7 pg (ref 26.0–34.0)
MCHC: 29.8 g/dL — ABNORMAL LOW (ref 30.0–36.0)
MCV: 92.8 fL (ref 80.0–100.0)
Platelets: 244 10*3/uL (ref 150–400)
RBC: 4.05 MIL/uL (ref 3.87–5.11)
RDW: 17.4 % — ABNORMAL HIGH (ref 11.5–15.5)
WBC: 6 10*3/uL (ref 4.0–10.5)
nRBC: 0 % (ref 0.0–0.2)

## 2023-04-26 LAB — I-STAT CHEM 8, ED
BUN: 74 mg/dL — ABNORMAL HIGH (ref 6–20)
Calcium, Ion: 0.88 mmol/L — CL (ref 1.15–1.40)
Chloride: 114 mmol/L — ABNORMAL HIGH (ref 98–111)
Creatinine, Ser: 9.9 mg/dL — ABNORMAL HIGH (ref 0.44–1.00)
Glucose, Bld: 104 mg/dL — ABNORMAL HIGH (ref 70–99)
HCT: 39 % (ref 36.0–46.0)
Hemoglobin: 13.3 g/dL (ref 12.0–15.0)
Potassium: 6.9 mmol/L (ref 3.5–5.1)
Sodium: 139 mmol/L (ref 135–145)
TCO2: 16 mmol/L — ABNORMAL LOW (ref 22–32)

## 2023-04-26 LAB — COMPREHENSIVE METABOLIC PANEL
ALT: 10 U/L (ref 0–44)
AST: 14 U/L — ABNORMAL LOW (ref 15–41)
Albumin: 3.4 g/dL — ABNORMAL LOW (ref 3.5–5.0)
Alkaline Phosphatase: 73 U/L (ref 38–126)
Anion gap: 24 — ABNORMAL HIGH (ref 5–15)
BUN: 81 mg/dL — ABNORMAL HIGH (ref 6–20)
CO2: 13 mmol/L — ABNORMAL LOW (ref 22–32)
Calcium: 8.2 mg/dL — ABNORMAL LOW (ref 8.9–10.3)
Chloride: 102 mmol/L (ref 98–111)
Creatinine, Ser: 8.51 mg/dL — ABNORMAL HIGH (ref 0.44–1.00)
GFR, Estimated: 5 mL/min — ABNORMAL LOW (ref >60)
Glucose, Bld: 107 mg/dL — ABNORMAL HIGH (ref 70–99)
Potassium: 7 mmol/L (ref 3.5–5.1)
Sodium: 139 mmol/L (ref 135–145)
Total Bilirubin: 0.7 mg/dL (ref 0.3–1.2)
Total Protein: 8.3 g/dL — ABNORMAL HIGH (ref 6.5–8.1)

## 2023-04-26 LAB — ETHANOL: Alcohol, Ethyl (B): <10 mg/dL (ref <10)

## 2023-04-26 LAB — PROTIME-INR
INR: 1.1 (ref 0.8–1.2)
Prothrombin Time: 14.9 s (ref 11.4–15.2)

## 2023-04-26 LAB — HEPATITIS B SURFACE ANTIGEN: Hepatitis B Surface Ag: NONREACTIVE

## 2023-04-26 LAB — CBG MONITORING, ED: Glucose-Capillary: 101 mg/dL — ABNORMAL HIGH (ref 70–99)

## 2023-04-26 LAB — APTT: aPTT: 36 s (ref 24–36)

## 2023-04-26 MED ORDER — HEPARIN SODIUM (PORCINE) 5000 UNIT/ML IJ SOLN
5000.0000 [IU] | Freq: Three times a day (TID) | INTRAMUSCULAR | Status: DC
  Administered 2023-04-27 – 2023-04-29 (×7): 5000 [IU] via SUBCUTANEOUS
  Filled 2023-04-26 (×8): qty 1

## 2023-04-26 MED ORDER — ACETAMINOPHEN 325 MG PO TABS
ORAL_TABLET | ORAL | Status: AC
  Administered 2023-04-27: 650 mg via ORAL
  Filled 2023-04-26: qty 2

## 2023-04-26 MED ORDER — INSULIN ASPART 100 UNIT/ML IV SOLN: 5.0000 [IU] | Freq: Once | INTRAVENOUS | Status: DC

## 2023-04-26 MED ORDER — SENNOSIDES-DOCUSATE SODIUM 8.6-50 MG PO TABS: 1.0000 | ORAL_TABLET | Freq: Every evening | ORAL | Status: DC | PRN

## 2023-04-26 MED ORDER — ALBUTEROL SULFATE (2.5 MG/3ML) 0.083% IN NEBU: 2.5000 mg | INHALATION_SOLUTION | Freq: Once | RESPIRATORY_TRACT | Status: DC

## 2023-04-26 MED ORDER — DEXTROSE 50 % IV SOLN: 1.0000 | Freq: Once | INTRAVENOUS | Status: DC

## 2023-04-26 MED ORDER — ASPIRIN 325 MG PO TBEC: 325.0000 mg | DELAYED_RELEASE_TABLET | Freq: Every day | ORAL | Status: DC

## 2023-04-26 MED ORDER — CHLORHEXIDINE GLUCONATE CLOTH 2 % EX PADS
6.0000 | MEDICATED_PAD | Freq: Every day | CUTANEOUS | Status: DC
  Administered 2023-04-27: 6 via TOPICAL

## 2023-04-26 MED ORDER — ACETAMINOPHEN 650 MG RE SUPP: 650.0000 mg | Freq: Four times a day (QID) | RECTAL | Status: DC | PRN

## 2023-04-26 MED ORDER — CLEVIDIPINE BUTYRATE 0.5 MG/ML IV EMUL
INTRAVENOUS | Status: AC
  Filled 2023-04-26: qty 50

## 2023-04-26 MED ORDER — CLEVIDIPINE BUTYRATE 0.5 MG/ML IV EMUL: 0.0000 mg/h | INTRAVENOUS | Status: DC

## 2023-04-26 MED ORDER — SODIUM CHLORIDE 0.9% FLUSH: 3.0000 mL | Freq: Once | INTRAVENOUS | Status: DC

## 2023-04-26 MED ORDER — CLOPIDOGREL BISULFATE 75 MG PO TABS
75.0000 mg | ORAL_TABLET | Freq: Every day | ORAL | Status: DC
  Administered 2023-04-27 – 2023-04-29 (×3): 75 mg via ORAL
  Filled 2023-04-26 (×3): qty 1

## 2023-04-26 MED ORDER — ACETAMINOPHEN 325 MG PO TABS
650.0000 mg | ORAL_TABLET | Freq: Four times a day (QID) | ORAL | Status: DC | PRN
  Administered 2023-04-28: 650 mg via ORAL
  Filled 2023-04-26 (×2): qty 2

## 2023-04-26 MED ORDER — CALCIUM GLUCONATE 10 % IV SOLN: 1.0000 g | Freq: Once | INTRAVENOUS | Status: DC

## 2023-04-26 NOTE — ED Provider Notes (Signed)
[place] EMERGENCY DEPARTMENT AT [facility] Provider Note   CSN: [id number] Arrival date & time: 04/26/23  1921     History  No chief complaint on file.   Kathy Frank is a 47 y.o. female.  HPI Patient presents for strokelike symptoms.  Medical history includes DM, depression, ESRD, anemia, HTN, PAD, bilateral AKA's, CAD.  Last dialysis was Saturday.  Patient was in her normal state of health this morning.  Last known well was 9:30 AM.  Daughter lives in the home with her.  They did not interact throughout the day.  This evening, she called out to her daughter stating that she felt like she was having a seizure.  EMS was called to the scene.  EMS did not see any seizure activity.  She did have some mild twitching.  She did seem to have left-sided weakness and diminished sensation.  During transit, she developed left facial droop and left hemineglect.  Vital signs prior to arrival notable for markedly elevated blood pressure.  CBG was in the 120s.    Home Medications Prior to Admission medications   Medication Sig Start Date End Date Taking? Authorizing Provider  Accu-Chek Softclix Lancets lancets Use as instructed 05/02/21   Atway, Rayann N, DO  albuterol (VENTOLIN HFA) 108 (90 Base) MCG/ACT inhaler Inhale 2 puffs into the lungs every 4 (four) hours as needed for wheezing or shortness of breath. 02/14/21   Angiulli, Mcarthur Rossetti, PA-C  amLODipine-olmesartan (AZOR) 10-40 MG tablet Take 1 tablet by mouth every evening. 02/09/23   [provider]  aspirin EC (EQ ASPIRIN ADULT LOW DOSE) 81 MG tablet Take 1 tablet (81 mg total) by mouth daily. Swallow whole. 03/02/23   Kathleen Lime, MD  atorvastatin (LIPITOR) 80 MG tablet Take 1 tablet (80 mg total) by mouth daily. 02/01/23   Willette Cluster, MD  bictegravir-emtricitabine-tenofovir AF (BIKTARVY) 50-200-25 MG TABS tablet Take 1 tablet by mouth daily. OVERDUE FOR OFFICE FOLLOW UP - PLEASE CALL OUR OFFICE TO SCHEDULE [phone]  02/23/23   Veryl Speak, FNP  busPIRone (BUSPAR) 5 MG tablet Take 0.5 tablets (2.5 mg total) by mouth 2 (two) times daily. 04/21/23 07/20/23  Modena Slater, DO  calcitRIOL (ROCALTROL) 0.25 MCG capsule Take 5 capsules (1.25 mcg total) by mouth Every Tuesday,Thursday,and Saturday with dialysis. 07/02/22   Marolyn Haller, MD  calcium acetate (PHOSLO) 667 MG capsule Take 2 capsules (1,334 mg total) by mouth 3 (three) times daily with meals. 06/30/22   Marolyn Haller, MD  Continuous Blood Gluc Sensor (DEXCOM G7 SENSOR) MISC Use to check blood sugar continuously 06/30/22   Marolyn Haller, MD  Continuous Glucose Receiver (DEXCOM G7 RECEIVER) DEVI One box 03/02/23   Kathleen Lime, MD  cyclobenzaprine (FLEXERIL) 5 MG tablet Take 5 mg by mouth 2 (two) times daily as needed for muscle spasms.    [provider]  Darbepoetin Alfa (ARANESP) 200 MCG/0.4ML SOSY injection Inject 0.4 mLs (200 mcg total) into the skin every Monday at 6 PM. Patient taking differently: Inject 200 mcg into the skin every 7 (seven) days. Every Monday 07/06/22   Marolyn Haller, MD  escitalopram (LEXAPRO) 5 MG tablet Take 1 tablet (5 mg total) by mouth daily. 02/01/23   Willette Cluster, MD  gabapentin (NEURONTIN) 100 MG capsule Take 100 mg by mouth at bedtime. 09/10/22   [provider]  glucose blood test strip Use as instructed 05/02/21   Atway, Rayann N, DO  hydrocortisone-pramoxine (PROCTOFOAM-HC) rectal foam Place 1 applicator rectally  2 (two) times daily. Patient not taking: Reported on 04/16/2023 11/07/22   Dione Booze, MD  insulin glargine (LANTUS SOLOSTAR) 100 UNIT/ML Solostar Pen Inject 15 Units into the skin at bedtime. 02/01/23   Willette Cluster, MD  Insulin Pen Needle (PENTIPS) 32G X 4 MM MISC Use as directed 11/10/22   Quincy Simmonds, MD  lidocaine (LIDODERM) 5 % Place 1 patch onto the skin daily. Remove & Discard patch within 12 hours or as directed by MD Patient taking differently: Place 1 patch onto the  skin daily as needed (pain). Remove & Discard patch within 12 hours or as directed by MD 04/06/23   Nooruddin, Jason Fila, MD  liraglutide (VICTOZA) 18 MG/3ML SOPN Inject 0.6 mg into the skin daily. 03/02/23   Kathleen Lime, MD  LOKELMA 5 g packet Take 5 g by mouth daily. 07/30/22   [provider]  multivitamin (RENA-VIT) TABS tablet Take 1 tablet by mouth daily. Patient taking differently: Take 1 tablet by mouth See admin instructions. On Tuesday, Wednesday and Thursday 02/14/21   Angiulli, Mcarthur Rossetti, PA-C  ondansetron (ZOFRAN) 8 MG tablet Take 1 tablet (8 mg total) by mouth every 8 (eight) hours as needed for nausea or vomiting. 12/25/22   Marrianne Mood, MD  ondansetron (ZOFRAN-ODT) 8 MG disintegrating tablet Take 1 tablet (8 mg total) by mouth every 8 (eight) hours as needed for nausea or vomiting. Patient not taking: Reported on 04/16/2023 02/25/23   Cathren Laine, MD  pantoprazole (PROTONIX) 40 MG tablet Take 1 tablet (40 mg total) by mouth 2 (two) times daily. 12/25/22   Marrianne Mood, MD  polyethylene glycol (MIRALAX / GLYCOLAX) 17 g packet Take 17 g by mouth 2 (two) times daily. Patient taking differently: Take 17 g by mouth daily as needed for moderate constipation. 12/25/22   Marrianne Mood, MD  senna (SENOKOT) 8.6 MG TABS tablet Take 1 tablet (8.6 mg total) by mouth daily as needed for mild constipation. 03/02/23   Kathleen Lime, MD  senna-docusate (SENOKOT-S) 8.6-50 MG tablet Take 1 tablet by mouth at bedtime. 12/25/22   Marrianne Mood, MD  sertraline (ZOLOFT) 25 MG tablet Take 25 mg by mouth daily. 03/12/23   [provider]  Ubrogepant (UBRELVY) 50 MG TABS Take 1 tablet (50 mg total) by mouth as needed (migraine headache). if needed a second dose may be taken at least 2 hours after the initial dose; MAX 8 doses in a month 01/25/23   Quincy Simmonds, MD      Allergies    Patient has no known allergies.    Review of Systems   Review of Systems  Neurological:  Positive  for tremors, facial asymmetry, weakness and numbness.  All other systems reviewed and are negative.   Physical Exam Updated Vital Signs BP (!) 158/76   Pulse (!) 101   Temp 98.2 F (36.8 C)   Resp (!) 41   Wt 74 kg   LMP 04/20/2023 Comment: tubal ligation  SpO2 99%   BMI 31.86 kg/m  Physical Exam Vitals and nursing note reviewed.  Constitutional:      General: She is not in acute distress.    Appearance: She is well-developed. She is not toxic-appearing or diaphoretic.  HENT:     Head: Normocephalic and atraumatic.     Right Ear: External ear normal.     Left Ear: External ear normal.     Nose: Nose normal.     Mouth/Throat:     Mouth: Mucous membranes are moist.  Eyes:     Extraocular Movements: Extraocular movements intact.     Conjunctiva/sclera: Conjunctivae normal.  Cardiovascular:     Rate and Rhythm: Normal rate and regular rhythm.  Pulmonary:     Effort: Pulmonary effort is normal. No respiratory distress.  Abdominal:     General: There is no distension.     Palpations: Abdomen is soft.  Musculoskeletal:        General: No deformity.     Cervical back: Neck supple.  Skin:    General: Skin is warm and dry.     Capillary Refill: Capillary refill takes less than 2 seconds.     Coloration: Skin is not jaundiced or pale.  Neurological:     Mental Status: She is alert.     Cranial Nerves: Facial asymmetry present.     Sensory: Sensory deficit present.     Motor: Weakness present.  Psychiatric:        Mood and Affect: Mood normal.     ED Results / Procedures / Treatments   Labs (all labs ordered are listed, but only abnormal results are displayed) Labs Reviewed  CBC - Abnormal; Notable for the following components:      Result Value   Hemoglobin 11.2 (*)    MCHC 29.8 (*)    RDW 17.4 (*)    All other components within normal limits  COMPREHENSIVE METABOLIC PANEL - Abnormal; Notable for the following components:   Potassium 7.0 (*)    CO2 13 (*)     Glucose, Bld 107 (*)    BUN 81 (*)    Creatinine, Ser 8.51 (*)    Calcium 8.2 (*)    Total Protein 8.3 (*)    Albumin 3.4 (*)    AST 14 (*)    GFR, Estimated 5 (*)    Anion gap 24 (*)    All other components within normal limits  CBG MONITORING, ED - Abnormal; Notable for the following components:   Glucose-Capillary 101 (*)    All other components within normal limits  I-STAT CHEM 8, ED - Abnormal; Notable for the following components:   Potassium 6.9 (*)    Chloride 114 (*)    BUN 74 (*)    Creatinine, Ser 9.90 (*)    Glucose, Bld 104 (*)    Calcium, Ion 0.88 (*)    TCO2 16 (*)    All other components within normal limits  PROTIME-INR  APTT  DIFFERENTIAL  ETHANOL  HEPATITIS B SURFACE ANTIGEN  HEPATITIS B SURFACE ANTIBODY, QUANTITATIVE  HCG, SERUM, QUALITATIVE  CBG MONITORING, ED    EKG None  Radiology MR ANGIO HEAD WO CONTRAST  Result Date: 04/26/2023 CLINICAL DATA:  Stroke suspected, left-sided facial droop, right-sided gaze EXAM: MRI HEAD WITHOUT CONTRAST MRA HEAD WITHOUT CONTRAST TECHNIQUE: Multiplanar, multi-echo pulse sequences of the brain and surrounding structures were acquired without intravenous contrast. Angiographic images of the Circle of Willis were acquired using MRA technique without intravenous contrast. COMPARISON:  06/07/2019 MRI head, correlation is also made with 04/26/2023 CT head FINDINGS: MRI HEAD FINDINGS Brain: No restricted diffusion to suggest acute or subacute infarct. No acute hemorrhage, mass, mass effect, or midline shift. No hydrocephalus or extra-axial collection. Normal pituitary and craniocervical junction. Small foci of hemosiderin deposition in the left posterior frontal lobe, right occipital lobe, right basal ganglia, and bilateral cerebellar hemispheres, likely sequela of remote hypertensive microhemorrhages. Redemonstrated remote infarct in the right basal ganglia and adjacent periventricular white matter. Additional new lacunar  infarcts in the bilateral basal ganglia and thalami. Remote right parietal and occipital and left posterior frontal infarcts are new from the prior exam. Confluent T2 hyperintense signal in the periventricular white matter and pons, likely the sequela of moderate chronic small vessel ischemic disease. Vascular: Normal arterial flow voids. Skull and upper cervical spine: Normal marrow signal. Sinuses/Orbits: Mucosal thickening in the left maxillary sinus and left sphenoid sinus. No acute finding in the orbits. Other: Trace fluid in left mastoid air cells. MRA HEAD FINDINGS Evaluation is limited by motion. Anterior circulation: In the distal extracranial left ICA, there is a possible 9 mm aneurysm (series 22, image 16), with a tortuous or kinked vessel also possible. Both internal carotid arteries are patent to the termini, without significant stenosis, although evaluation of the cavernous segment is particularly motion limited. A1 segments patent. Normal anterior communicating artery. Anterior cerebral arteries are patent to their distal aspects without significant stenosis. No M1 stenosis or occlusion. Distal MCA branches perfused to their distal aspects without significant stenosis. Posterior circulation: Vertebral arteries patent to the vertebrobasilar junction without stenosis. Posterior inferior cerebral arteries patent bilaterally. Basilar patent to its distal aspect, although evaluation of the distal basilar is particularly motion limited. Superior cerebellar arteries patent proximally. Patent P1 segments. PCAs perfused to their distal aspects without significant stenosis. Anatomic variants: None significant IMPRESSION: 1. No acute intracranial process. No evidence of acute or subacute infarct. 2. No intracranial large vessel occlusion or significant stenosis. 3. Possible 9 mm aneurysm in the distal extracranial left ICA, with a tortuous or kinked vessel also possible. This could be further evaluated with a  CTA. These findings were discussed by telephone on 04/26/2023 at 10:53 pm with provider ERIC LINDZEN . Electronically Signed   By: Wiliam Ke M.D.   On: 04/26/2023 22:58   MR BRAIN WO CONTRAST  Result Date: 04/26/2023 CLINICAL DATA:  Stroke suspected, left-sided facial droop, right-sided gaze EXAM: MRI HEAD WITHOUT CONTRAST MRA HEAD WITHOUT CONTRAST TECHNIQUE: Multiplanar, multi-echo pulse sequences of the brain and surrounding structures were acquired without intravenous contrast. Angiographic images of the Circle of Willis were acquired using MRA technique without intravenous contrast. COMPARISON:  06/07/2019 MRI head, correlation is also made with 04/26/2023 CT head FINDINGS: MRI HEAD FINDINGS Brain: No restricted diffusion to suggest acute or subacute infarct. No acute hemorrhage, mass, mass effect, or midline shift. No hydrocephalus or extra-axial collection. Normal pituitary and craniocervical junction. Small foci of hemosiderin deposition in the left posterior frontal lobe, right occipital lobe, right basal ganglia, and bilateral cerebellar hemispheres, likely sequela of remote hypertensive microhemorrhages. Redemonstrated remote infarct in the right basal ganglia and adjacent periventricular white matter. Additional new lacunar infarcts in the bilateral basal ganglia and thalami. Remote right parietal and occipital and left posterior frontal infarcts are new from the prior exam. Confluent T2 hyperintense signal in the periventricular white matter and pons, likely the sequela of moderate chronic small vessel ischemic disease. Vascular: Normal arterial flow voids. Skull and upper cervical spine: Normal marrow signal. Sinuses/Orbits: Mucosal thickening in the left maxillary sinus and left sphenoid sinus. No acute finding in the orbits. Other: Trace fluid in left mastoid air cells. MRA HEAD FINDINGS Evaluation is limited by motion. Anterior circulation: In the distal extracranial left ICA, there is a  possible 9 mm aneurysm (series 22, image 16), with a tortuous or kinked vessel also possible. Both internal carotid arteries are patent to the termini, without significant stenosis, although evaluation of the cavernous segment is particularly motion  limited. A1 segments patent. Normal anterior communicating artery. Anterior cerebral arteries are patent to their distal aspects without significant stenosis. No M1 stenosis or occlusion. Distal MCA branches perfused to their distal aspects without significant stenosis. Posterior circulation: Vertebral arteries patent to the vertebrobasilar junction without stenosis. Posterior inferior cerebral arteries patent bilaterally. Basilar patent to its distal aspect, although evaluation of the distal basilar is particularly motion limited. Superior cerebellar arteries patent proximally. Patent P1 segments. PCAs perfused to their distal aspects without significant stenosis. Anatomic variants: None significant IMPRESSION: 1. No acute intracranial process. No evidence of acute or subacute infarct. 2. No intracranial large vessel occlusion or significant stenosis. 3. Possible 9 mm aneurysm in the distal extracranial left ICA, with a tortuous or kinked vessel also possible. This could be further evaluated with a CTA. These findings were discussed by telephone on 04/26/2023 at 10:53 pm with provider ERIC LINDZEN . Electronically Signed   By: Wiliam Ke M.D.   On: 04/26/2023 22:58   CT HEAD CODE STROKE WO CONTRAST  Result Date: 04/26/2023 CLINICAL DATA:  Code stroke. Neuro deficit, acute, stroke suspected. Left-sided weakness and slurred speech. EXAM: CT HEAD WITHOUT CONTRAST TECHNIQUE: Contiguous axial images were obtained from the base of the skull through the vertex without intravenous contrast. RADIATION DOSE REDUCTION: This exam was performed according to the departmental dose-optimization program which includes automated exposure control, adjustment of the mA and/or kV  according to patient size and/or use of iterative reconstruction technique. COMPARISON:  Head CT 04/20/2023. FINDINGS: Brain: No acute hemorrhage. Unchanged background of moderate chronic small-vessel disease with old cortical infarct in the right occipital lobe, old perforator infarcts in the right lentiform nucleus and corona radiata, as well as the bilateral thalami. No new loss of gray-white differentiation. No hydrocephalus or extra-axial collection. No mass effect or midline shift. Vascular: No hyperdense vessel or unexpected calcification. Skull: No calvarial fracture or suspicious bone lesion. Skull base is unremarkable. Sinuses/Orbits: No acute finding. Other: None. ASPECTS [facility] Stroke Program Early CT Score) - Ganglionic level infarction (caudate, lentiform nuclei, internal capsule, insula, M1-M3 cortex): 7 - Supraganglionic infarction (M4-M6 cortex): 3 Total score (0-10 with 10 being normal): 10 IMPRESSION: 1. No acute intracranial hemorrhage or evidence of acute large vessel territory infarct. ASPECT score is 10. 2. Unchanged background of moderate chronic small-vessel disease with old cortical infarct in the right occipital lobe, old perforator infarcts in the right lentiform nucleus and corona radiata, as well as the bilateral thalami. Code stroke imaging results were communicated on 04/26/2023 at 7:47 pm to provider Dr. Otelia Limes via secure text paging. Electronically Signed   By: Orvan Falconer M.D.   On: 04/26/2023 19:48    Procedures Procedures    Medications Ordered in ED Medications  sodium chloride flush (NS) 0.9 % injection 3 mL (has no administration in time range)  clevidipine (CLEVIPREX) infusion 0.5 mg/mL (has no administration in time range)  calcium gluconate inj 10% (1 g) URGENT USE ONLY! (has no administration in time range)  insulin aspart (novoLOG) injection 5 Units (has no administration in time range)    And  dextrose 50 % solution 50 mL (has no administration in time  range)  albuterol (PROVENTIL) (2.5 MG/3ML) 0.083% nebulizer solution 2.5 mg (has no administration in time range)  Chlorhexidine Gluconate Cloth 2 % PADS 6 each (has no administration in time range)  aspirin EC tablet 325 mg (has no administration in time range)  clopidogrel (PLAVIX) tablet 75 mg (has no administration in time  range)    ED Course/ Medical Decision Making/ A&P                                 Medical Decision Making Amount and/or Complexity of Data Reviewed Labs: ordered. Radiology: ordered.  Risk OTC drugs. Prescription drug management.   This patient presents to the ED for concern of strokelike symptoms, this involves an extensive number of treatment options, and is a complaint that carries with it a high risk of complications and morbidity.  The differential diagnosis includes CVA, TIA, seizure, hypertensive emergency, metabolic derangements   Co morbidities that complicate the patient evaluation  DM, depression, ESRD, anemia, HTN, PAD, bilateral AKA's, CAD   Additional history obtained:  Additional history obtained from EMS, patient's family External records from outside source obtained and reviewed including EMR   Lab Tests:  I Ordered, and personally interpreted labs.  The pertinent results include: Hyperkalemia present.  Elevated creatinine and BUN are present consistent with ESRD, no leukocytosis is present.  Anemia is baseline.   Imaging Studies ordered:  I ordered imaging studies including CT head, MRI brain I independently visualized and interpreted imaging which showed no acute findings I agree with the radiologist interpretation   Cardiac Monitoring: / EKG:  The patient was maintained on a cardiac monitor.  I personally viewed and interpreted the cardiac monitored which showed an underlying rhythm of: Sinus rhythm   Consultations Obtained:  I requested consultation with the neurologist, Dr. Otelia Limes,  and discussed lab and imaging  findings as well as pertinent plan - they recommend: Hospital admission for further stroke workup I requested consultation with the nephrologist, Dr. Juel Burrow,  and discussed lab and imaging findings as well as pertinent plan - they recommend: Dialysis, likely tonight  Problem List / ED Course / Critical interventions / Medication management  Patient arrives as a code stroke after she was noted to have left hemibody weakness, numbness sensation, left hemineglect, and facial droop prior to arrival.  Blood pressure on arrival was elevated in the 220s SBP.  Patient was taken directly to CT scanner.  CT imaging did not show acute findings.  Neurology ordered stat MRI/MRA.  Following MRI, patient returned to the room.  At this point, she is moving all extremities.  She remains encephalopathic.  Her hypertension has improved.  Family is now present at bedside.  They state that she was mildly confused this morning but is significantly altered today.  They state that she has had some intermittent episodes of encephalopathy over the past several weeks.  Per chart review, she was seen 1 week ago for similar symptoms which resolved while in the ED.  Family is concerned that it may be secondary to gabapentin which is seemed like she started when symptoms began.  MRIs did not show acute findings.  Neurology recommends hospitalist admission for further stroke workup.  Patient's lab work was notable for a potassium of 7.0.  Temporizing medications were ordered.  There were issues with IV access.  Patient's initial IV extravasated while in CT scanner.  She does have some swelling to right bicep.  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  I spoke with nephrologist, Dr. Juel Burrow, who will arrange for dialysis.  He came and evaluated the patient in the ED and does plan on dialysis tonight.  Patient was admitted for further management. I ordered medication including insulin/dextrose, calcium gluconate, and albuterol for temporization of  hyperkalemia Reevaluation of the patient after these medicines showed that the patient stayed the same I have reviewed the patients home medicines and have made adjustments as needed   Social Determinants of Health:  Lives at home with family  CRITICAL CARE Performed by: Gloris Manchester   Total critical care time: 35 minutes  Critical care time was exclusive of separately billable procedures and treating other patients.  Critical care was necessary to treat or prevent imminent or life-threatening deterioration.  Critical care was time spent personally by me on the following activities: development of treatment plan with patient and/or surrogate as well as nursing, discussions with consultants, evaluation of patient's response to treatment, examination of patient, obtaining history from patient or surrogate, ordering and performing treatments and interventions, ordering and review of laboratory studies, ordering and review of radiographic studies, pulse oximetry and re-evaluation of patient's condition.         Final Clinical Impression(s) / ED Diagnoses Final diagnoses:  Stroke-like symptoms  Hyperkalemia    Rx / DC Orders ED Discharge Orders     None         Gloris Manchester, MD 04/26/23 2305

## 2023-04-26 NOTE — ED Triage Notes (Signed)
BIB EMS for possible stroke. LKW according to sister is 0930 this morning. EMS originally called for seizure-like activity, but pt talking through tremors and shaking. Stroke-like symptoms noted by EMS on ambulance, left-sided facial droop, right-sided gaze. Pt hypertensive en route

## 2023-04-26 NOTE — Consult Note (Signed)
NEURO HOSPITALIST CONSULT NOTE   Requesting physician: Dr. Durwin Nora  Reason for Consult: Acute onset of left facial droop, slurred speech, left sided weakness and left hemineglect  History obtained from:  EMS, Patient and Chart     HPI:                                                                                                                                          Kathy Frank is an 47 y.o. female with a PMHx of CAD s/p stent placement, anemia, CAP in Feb of this year, stroke, depression, DM2, ESRD on HD, fecal incontinence, HIV, HLD, HTN, prior episodes of hypertensive emergency/urgency, diabetic retinopathy, PAD and bilateral AKA who presents to the ED via EMS as a Code Stroke for acute onset of left facial droop, slurred speech, left sided weakness and left hemineglect. LKN was 0930 today. Family stated to EMS that later in the day, they noticed that she was not talking normally. This evening, they saw her shaking all over and called EMS for what they thought was a seizure. On EMS arrival, the patient was awake and alert with left facial droop, left arm and leg weakness, slurred speech and left hemineglect. During transport, she developed right sided gaze deviation. Vitals per EMS: BP 230/110, HR 102, 99% on RA, RR normal.   Modified Rankin Score: 0  Home medications include ASA and atorvastatin.   Past Medical History:  Diagnosis Date   Analgesic overuse headache 06/28/2021   Anemia, posthemorrhagic, acute 10/14/2022   Anxiety    CAD (coronary artery disease)    CAP (community acquired pneumonia) 09/29/2022   CVA (cerebral vascular accident) [facility])    Depression 06/28/2006   Qualifier: Diagnosis of  By: Laveda Abbe MD, Todd     Diabetes mellitus type 2 in obese 06/28/1994   Dyspnea    uses oxygeb 2 liters per minute at dialysis   End stage renal disease on dialysis [facility])    on hemodialysis T/Th/Sat   Erosive esophagitis    Esophageal reflux    Eye redness     Fecal incontinence 02/04/2018   Gastroparesis    ? diabetic   GERD (gastroesophageal reflux disease)    Human immunodeficiency virus (HIV) disease (HCC) 04/23/2016   Hyperkalemia 10/12/2022   Hyperlipidemia    Hypertension    Hypertensive emergency 11/09/2021   Hypertensive urgency 05/20/2021   Metabolic bone disease 05/02/2019   Moderate nonproliferative diabetic retinopathy of both eyes (HCC) 11/21/2014   11/14/14: Noted on retinal imaging; needs follow-up imaging in 6 months  05/22/16: Noted on retinal imaging again; needs follow-up imaging in 6 months   PAD (peripheral artery disease) (HCC)    Type 2 diabetes mellitus with diabetic peripheral angiopathy without gangrene (HCC) 05/01/2019   Wound  infection s/p L transmetatarsal amputation     Past Surgical History:  Procedure Laterality Date   A/V SHUNTOGRAM Left 04/21/2023   Procedure: A/V SHUNTOGRAM;  Surgeon: Victorino Sparrow, MD;  Location: [facility] INVASIVE CV LAB;  Service: Cardiovascular;  Laterality: Left;   ABDOMINAL AORTOGRAM W/LOWER EXTREMITY N/A 12/13/2020   Procedure: ABDOMINAL AORTOGRAM W/LOWER EXTREMITY;  Surgeon: Chuck Hint, MD;  Location: [facility] INVASIVE CV LAB;  Service: Cardiovascular;  Laterality: N/A;   AMPUTATION Left 07/08/2018   Procedure: AMPUTATION FORTH RAY LEFT FOOT;  Surgeon: Nadara Mustard, MD;  Location: [facility] OR;  Service: Orthopedics;  Laterality: Left;   AMPUTATION Left 08/09/2018   Procedure: Left Transmetatarsal Amputation;  Surgeon: Nadara Mustard, MD;  Location: [facility] OR;  Service: Orthopedics;  Laterality: Left;   AMPUTATION Left 10/08/2018   Procedure: LEFT BELOW KNEE AMPUTATION;  Surgeon: Nadara Mustard, MD;  Location: [facility] OR;  Service: Orthopedics;  Laterality: Left;   AMPUTATION Left 10/28/2018   Procedure: REVISION BELOW KNEE AMPUTATION;  Surgeon: Nadara Mustard, MD;  Location: [facility] OR;  Service: Orthopedics;  Laterality: Left;   AMPUTATION Left 12/16/2018   Procedure: LEFT ABOVE KNEE AMPUTATION;   Surgeon: Nadara Mustard, MD;  Location: [facility] OR;  Service: Orthopedics;  Laterality: Left;   AMPUTATION Right 01/31/2021   Procedure: AMPUTATION BELOW KNEE;  Surgeon: Nada Libman, MD;  Location: [facility] OR;  Service: Vascular;  Laterality: Right;   AMPUTATION Right 03/28/2021   Procedure: REVISION OF RIGHT BELOW KNEE AMPUTATION;  Surgeon: Nada Libman, MD;  Location: [facility] OR;  Service: Vascular;  Laterality: Right;   AMPUTATION Right 04/18/2021   Procedure: right below knee amputation/washout placement wound vac;  Surgeon: Leonie Douglas, MD;  Location: [facility] OR;  Service: Vascular;  Laterality: Right;   AMPUTATION Right 02/19/2023   Procedure: CONVERSION TO ABOVE KNEE AMPUTATION;  Surgeon: Maeola Harman, MD;  Location: [facility] OR;  Service: Vascular;  Laterality: Right;   APPLICATION OF WOUND VAC Left 12/31/2022   Procedure: APPLICATION OF PREVENA WOUND VAC;  Surgeon: Victorino Sparrow, MD;  Location: [facility] OR;  Service: Vascular;  Laterality: Left;   AV FISTULA PLACEMENT Left 10/14/2018   Procedure: Arteriovenous (Av) Fistula Creation Left Arm;  Surgeon: Cephus Shelling, MD;  Location: [facility] OR;  Service: Vascular;  Laterality: Left;   BASCILIC VEIN TRANSPOSITION Left 04/14/2019   Procedure: BASILIC VEIN TRANSPOSITION SECOND STAGE LEFT ARM;  Surgeon: Larina Earthly, MD;  Location: [facility] OR;  Service: Vascular;  Laterality: Left;   BIOPSY  10/14/2022   Procedure: BIOPSY;  Surgeon: Benancio Deeds, MD;  Location: [facility] ENDOSCOPY;  Service: Gastroenterology;;   COMPLEX WOUND CLOSURE Left 12/31/2022   Procedure: COMPLEX WOUND CLOSURE;  Surgeon: Victorino Sparrow, MD;  Location: [facility] OR;  Service: Vascular;  Laterality: Left;   CORONARY STENT INTERVENTION N/A 05/21/2021   Procedure: CORONARY STENT INTERVENTION;  Surgeon: Marykay Lex, MD;  Location: [facility] INVASIVE CV LAB;  Service: Cardiovascular;  Laterality: N/A;   ESOPHAGOGASTRODUODENOSCOPY (EGD) WITH PROPOFOL N/A 10/14/2022   Procedure:  ESOPHAGOGASTRODUODENOSCOPY (EGD) WITH PROPOFOL;  Surgeon: Benancio Deeds, MD;  Location: [facility] ENDOSCOPY;  Service: Gastroenterology;  Laterality: N/A;   HEMOSTASIS CLIP PLACEMENT  10/14/2022   Procedure: HEMOSTASIS CLIP PLACEMENT;  Surgeon: Benancio Deeds, MD;  Location: [facility] ENDOSCOPY;  Service: Gastroenterology;;   INCISION AND DRAINAGE Left 12/31/2022   Procedure: INCISION AND DRAINAGE OF LEFT ARM;  Surgeon: Victorino Sparrow, MD;  Location: [facility] OR;  Service: Vascular;  Laterality: Left;   INCISION AND DRAINAGE ABSCESS N/A 10/24/2020   Procedure: exicision of hydradenitis;  Surgeon: Romie Levee, MD;  Location: [facility];  Service: General;  Laterality: N/A;  45 min   INSERTION OF DIALYSIS CATHETER Right 04/14/2019   Procedure: INSERTION OF DIALYSIS CATHETER;  Surgeon: Larina Earthly, MD;  Location: [facility] OR;  Service: Vascular;  Laterality: Right;   IR FLUORO GUIDE CV LINE RIGHT  10/13/2022   IR REMOVAL TUN CV CATH W/O FL  10/16/2022   IR US GUIDE VASC ACCESS RIGHT  10/13/2022   LEFT HEART CATH AND CORONARY ANGIOGRAPHY N/A 05/21/2021   Procedure: LEFT HEART CATH AND CORONARY ANGIOGRAPHY;  Surgeon: Marykay Lex, MD;  Location: [facility] INVASIVE CV LAB;  Service: Cardiovascular;  Laterality: N/A;   LOWER EXTREMITY ANGIOGRAPHY N/A 07/05/2018   Procedure: LOWER EXTREMITY ANGIOGRAPHY;  Surgeon: Nada Libman, MD;  Location: [facility] INVASIVE CV LAB;  Service: Cardiovascular;  Laterality: N/A;   PERIPHERAL VASCULAR ATHERECTOMY Right 12/13/2020   Procedure: PERIPHERAL VASCULAR ATHERECTOMY;  Surgeon: Chuck Hint, MD;  Location: [facility] INVASIVE CV LAB;  Service: Cardiovascular;  Laterality: Right;  Superficial femoral   PERIPHERAL VASCULAR BALLOON ANGIOPLASTY Left 07/05/2018   Procedure: PERIPHERAL VASCULAR BALLOON ANGIOPLASTY;  Surgeon: Nada Libman, MD;  Location: [facility] INVASIVE CV LAB;  Service: Cardiovascular;  Laterality: Left;  SFA   PERIPHERAL VASCULAR BALLOON ANGIOPLASTY Right  12/13/2020   Procedure: PERIPHERAL VASCULAR BALLOON ANGIOPLASTY;  Surgeon: Chuck Hint, MD;  Location: [facility] INVASIVE CV LAB;  Service: Cardiovascular;  Laterality: Right;  Peroneal artery, anterior tibial artery   RECTAL EXAM UNDER ANESTHESIA N/A 10/24/2020   Procedure: EXAM UNDER ANESTHESIA;  Surgeon: Romie Levee, MD;  Location: [facility];  Service: General;  Laterality: N/A;   STUMP REVISION Left 10/19/2018   Procedure: REVISION LEFT BELOW KNEE AMPUTATION;  Surgeon: Nadara Mustard, MD;  Location: [facility] OR;  Service: Orthopedics;  Laterality: Left;   TUBAL LIGATION  2002    Family History  Problem Relation Age of Onset   Diabetes Mother    Diabetes Brother    Diabetes Daughter    Diabetes Daughter    Mental retardation Brother        died from PNA   Diabetes Maternal Grandmother              Social History:  reports that she quit smoking about 8 years ago. Her smoking use included cigarettes. She started smoking about 9 years ago. She has a 1 pack-year smoking history. She has never used smokeless tobacco. She reports that she does not currently use alcohol. She reports that she does not currently use drugs after having used the following drugs: Marijuana. She vapes.   No Known Allergies  MEDICATIONS:                                                                                                                      Current Facility-Administered Medications  on File Prior to Encounter  Medication Dose Route Frequency Provider Last Rate Last Admin   0.9 %  sodium chloride infusion  250 mL Intravenous PRN Victorino Sparrow, MD       sodium chloride flush (NS) 0.9 % injection 3 mL  3 mL Intravenous Q12H Victorino Sparrow, MD       sodium chloride flush (NS) 0.9 % injection 3 mL  3 mL Intravenous PRN Victorino Sparrow, MD       Current Outpatient Medications on File Prior to Encounter  Medication Sig Dispense Refill   Accu-Chek Softclix Lancets lancets Use as  instructed 100 each 12   albuterol (VENTOLIN HFA) 108 (90 Base) MCG/ACT inhaler Inhale 2 puffs into the lungs every 4 (four) hours as needed for wheezing or shortness of breath. 8.5 g 0   amLODipine-olmesartan (AZOR) 10-40 MG tablet Take 1 tablet by mouth every evening.     aspirin EC (EQ ASPIRIN ADULT LOW DOSE) 81 MG tablet Take 1 tablet (81 mg total) by mouth daily. Swallow whole. 30 tablet 3   atorvastatin (LIPITOR) 80 MG tablet Take 1 tablet (80 mg total) by mouth daily. 30 tablet 11   bictegravir-emtricitabine-tenofovir AF (BIKTARVY) 50-200-25 MG TABS tablet Take 1 tablet by mouth daily. OVERDUE FOR OFFICE FOLLOW UP - PLEASE CALL OUR OFFICE TO SCHEDULE [phone] 30 tablet 5   busPIRone (BUSPAR) 5 MG tablet Take 0.5 tablets (2.5 mg total) by mouth 2 (two) times daily. 30 tablet 2   calcitRIOL (ROCALTROL) 0.25 MCG capsule Take 5 capsules (1.25 mcg total) by mouth Every Tuesday,Thursday,and Saturday with dialysis. 60 capsule 0   calcium acetate (PHOSLO) 667 MG capsule Take 2 capsules (1,334 mg total) by mouth 3 (three) times daily with meals. 180 capsule 0   Continuous Blood Gluc Sensor (DEXCOM G7 SENSOR) MISC Use to check blood sugar continuously 10 each 6   Continuous Glucose Receiver (DEXCOM G7 RECEIVER) DEVI One box 1 each 0   cyclobenzaprine (FLEXERIL) 5 MG tablet Take 5 mg by mouth 2 (two) times daily as needed for muscle spasms.     Darbepoetin Alfa (ARANESP) 200 MCG/0.4ML SOSY injection Inject 0.4 mLs (200 mcg total) into the skin every Monday at 6 PM. (Patient taking differently: Inject 200 mcg into the skin every 7 (seven) days. Every Monday) 1.68 mL    escitalopram (LEXAPRO) 5 MG tablet Take 1 tablet (5 mg total) by mouth daily. 30 tablet 2   gabapentin (NEURONTIN) 100 MG capsule Take 100 mg by mouth at bedtime.     glucose blood test strip Use as instructed 100 each 12   hydrocortisone-pramoxine (PROCTOFOAM-HC) rectal foam Place 1 applicator rectally 2 (two) times daily. (Patient not  taking: Reported on 04/16/2023) 10 g 1   insulin glargine (LANTUS SOLOSTAR) 100 UNIT/ML Solostar Pen Inject 15 Units into the skin at bedtime. 6 mL 4   Insulin Pen Needle (PENTIPS) 32G X 4 MM MISC Use as directed 100 each 3   lidocaine (LIDODERM) 5 % Place 1 patch onto the skin daily. Remove & Discard patch within 12 hours or as directed by MD (Patient taking differently: Place 1 patch onto the skin daily as needed (pain). Remove & Discard patch within 12 hours or as directed by MD) 14 patch 0   liraglutide (VICTOZA) 18 MG/3ML SOPN Inject 0.6 mg into the skin daily. 3 mL 11   LOKELMA 5 g packet Take 5 g by mouth daily.     multivitamin (RENA-VIT) TABS  tablet Take 1 tablet by mouth daily. (Patient taking differently: Take 1 tablet by mouth See admin instructions. On Tuesday, Wednesday and Thursday) 30 tablet 0   ondansetron (ZOFRAN) 8 MG tablet Take 1 tablet (8 mg total) by mouth every 8 (eight) hours as needed for nausea or vomiting. 12 tablet 0   ondansetron (ZOFRAN-ODT) 8 MG disintegrating tablet Take 1 tablet (8 mg total) by mouth every 8 (eight) hours as needed for nausea or vomiting. (Patient not taking: Reported on 04/16/2023) 10 tablet 0   pantoprazole (PROTONIX) 40 MG tablet Take 1 tablet (40 mg total) by mouth 2 (two) times daily. 60 tablet 2   polyethylene glycol (MIRALAX / GLYCOLAX) 17 g packet Take 17 g by mouth 2 (two) times daily. (Patient taking differently: Take 17 g by mouth daily as needed for moderate constipation.) 14 each 0   senna (SENOKOT) 8.6 MG TABS tablet Take 1 tablet (8.6 mg total) by mouth daily as needed for mild constipation. 120 tablet 0   senna-docusate (SENOKOT-S) 8.6-50 MG tablet Take 1 tablet by mouth at bedtime. 30 tablet 2   sertraline (ZOLOFT) 25 MG tablet Take 25 mg by mouth daily.     Ubrogepant (UBRELVY) 50 MG TABS Take 1 tablet (50 mg total) by mouth as needed (migraine headache). if needed a second dose may be taken at least 2 hours after the initial dose; MAX 8  doses in a month 8 tablet 3     ROS:                                                                                                                                       Does not endorse any headache pain. Detailed ROS deferred due to acuity of presentation. Other symptoms as per HPI.   BP (!) 147/109 (BP Location: Right Arm)   Pulse 99   Resp 15   Wt 74 kg   LMP 04/20/2023 Comment: tubal ligation  SpO2 100%   BMI 31.86 kg/m      General Examination:                                                                                                       Physical Exam  HEENT-  /AT    Lungs- Respirations unlabored Extremities- Bilateral AKAs  Neurological Examination Mental Status: Awake with decreased level of alertness. Left hemineglect. Partially oriented to time and place. Knows she is in the hospital "because things aren't working right". Speech is  fluent and mildly dysarthric. Comprehension intact for all questions and commands. Naming intact. Alert, oriented x 5, thought content appropriate.  Speech fluent without evidence of aphasia.  Able to follow all commands without difficulty. Cranial Nerves: II: Left homonymous hemianopsia. PERRL  III,IV, VI: No ptosis. Right gaze deviation. Unable to cross midline to the left.   V: Insensate on the left.  VII: Left facial droop VIII: Hearing intact to voice IX,X: No hoarseness XI: Head preferentially turned to the right. Does not turn head past midline to the left.  XII: Midline tongue extension Motor: RUE 5/5 Right hip flexion 5/5 (AKA) LUE unable to follow motor commands but will flail her arm antigravity spontaneously while reaching for her head and right arm LLE: Unable to follow command to lift LLE but did move it when asked to perform a swimming motion with both of her lower extremities - with this maneuver amplitude of left stump movement is less than on the right.   Sensory: Insensate on the left.  Deep Tendon Reflexes:  2+ bilateral brachioradialis and biceps.  Cerebellar: No ataxia with FNF on the right. Unable to perform on the left.   Gait: Unable to assess  NIHSS: 15   Lab Results: Basic Metabolic Panel: Recent Labs  Lab 04/20/23 1235 04/20/23 1321  NA 136 137  K 3.5 3.9  CL 96*  --   CO2 22  --   GLUCOSE 94  --   BUN 26*  --   CREATININE 3.91*  --   CALCIUM 9.6  --     CBC: Recent Labs  Lab 04/20/23 1241 04/20/23 1321  WBC 5.4  --   NEUTROABS 3.7  --   HGB 11.7* 14.6  HCT 33.3* 43.0  MCV 93.8  --   PLT 145*  --     Cardiac Enzymes: No results for input(s): "CKTOTAL", "CKMB", "CKMBINDEX", "TROPONINI" in the last 168 hours.  Lipid Panel: No results for input(s): "CHOL", "TRIG", "HDL", "CHOLHDL", "VLDL", "LDLCALC" in the last 168 hours.  Imaging: No results found.   Assessment: 48 year old female with a history of DM2, HTN, ESRD, CAD, PVD and stroke presenting via EMS as a Code Stroke with acute onset of left facial droop, slurred speech, left sided weakness and left hemineglect - Exam reveals findings referable to a large lesion within the right MCA territory. NIHSS 15.  - CT head:  No acute intracranial hemorrhage or evidence of acute large vessel territory infarct. ASPECT score is 10. Unchanged background of moderate chronic small-vessel disease with old cortical infarct in the right occipital lobe, old perforator infarcts in the right lentiform nucleus and corona radiata, as well as the bilateral thalami. - Overall presentation is most consistent with an acute right MCA ischemic stroke. Suspect possible LVO.  - Out of the TNK time window - Unable to perform STAT CTA of head and neck after multiple attempts to obtain venous access for contrast injection, including ultrasound-guided IV placement - STAT MRI brain with MRA head: Preliminary reading by Neurology reveals no LVO or acute DWI abnormality.   Recommendations: 1. HgbA1c, fasting lipid panel 2. Carotid ultrasound 3.  PT consult, OT consult, Speech consult 4. TTE 5. Continue atorvastatin 6. ASA 325 mg and Plavix 75 mg PO x 1 now. If unable to administer PO, give ASA 300 mg per rectum now. Continue daily DAPT indefinitely.  7. Risk factor modification 8. Telemetry monitoring 9. Frequent neuro checks 10 NPO until passes stroke swallow screen 11.  Permissive HTN x 24 hours. Treat blood pressure if SBP is > 220 or DBP is > 120. After 24 hours, gradually reduce BP by 20% per day to a final goal of 120-140/80-90.   Addendum: MRI brain with MRA head has been reviewed by telephone with Radiology. Radiology confirms no acute findings on MRI brain and MRA head with no LVO.   Electronically signed: Dr. Caryl Pina 04/26/2023, 7:37 PM

## 2023-04-26 NOTE — Consult Note (Signed)
Reason for Consult: ESRD Referring Physician:  Dr. Gloris Manchester  Chief Complaint: CVA  Dialysis Orders: TTS [facility]  4h  400/1.5   66.9kg   2K/3Ca bath  RIJ TDC / revised AVF LUA Heparin none - started 7/06 w/ one needle in AVF and other line in the catheter - mircera 150 mcg IV q 2 wks, last on 04/08/23 - Calcitriol 0.75 mcg po three times per week  Assessment/Plan: CVA - seen by neurology ASA, Plavix, Atorvastatin, imaging ongoing. PAD s/p R BKA stump infection earlier in the summer (7/12) -> AKA now with b/l AKA's.  ESRD - on HD TTS.  Will try to get her on overnight; we should be able to get her on at 11P. HTN - restart antihypertensives with goal MAP > 80 to maintain adequate  cerebral perfusion in the setting of a CVA; no e/o ICH. Volume - By history she is volume overloaded but will be gentle with UF to prevent drop in BP given clinical presentation. 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 No Aranesp  needed (last  given 04/08/23)  MBD ckd - CCa and phos in range. Cont po vdra and phoslo as binder.  HIV -Meds Per admit med team PAD - S/P bilateral amputations (AKA's) DM2 - per pmd Chronic diarrhea    HPI: Kathy Frank is an 47 y.o. female h/o CVA, CASHD, DM, HTN, erosive eophagitis, GERD, HIV, deabetic retinopathy, PAD w/ right BKA -> AKA, left AKA recently in the ED for AMS during dialysis treatment on 9/10 thought to be related to opiates but mental status returned back to baseline while in the ED. Patient now presenting with left facial droop, slurred speech, left sided weakness.  During transport by EMS she was awake and alert with left sided weakness, facial droop, right sided gaze deviation and BP 230/110. No acute bleed on CT head without contrast with old cortical infarct in the right occipital lobe, rt lentiform nucleus. MR brain and MR angio were performed but read not out at time of consultation. Patient last dialysis was on 9/14 leaving at 68kg. She did reach her EDW 2 weeks ago  consistently but past few treatments has been leaving above her EDW. She does sometimes sign off early with past 2 treatments early each session and last week there was a treatment that she stayed for only 3 hrs.    ROS Pertinent items are noted in HPI.  Chemistry and CBC: Creat  Date/Time Value Ref Range Status  05/29/2020 11:55 AM 12.07 (H) 0.50 - 1.10 mg/dL Final    Comment:    Verified by repeat analysis. .   05/27/2016 10:18 AM 1.68 (H) 0.50 - 1.10 mg/dL Final  16/05/9603 54:09 AM 0.92 0.50 - 1.10 mg/dL Final  81/19/1478 29:56 PM 1.03 0.50 - 1.10 mg/dL Final   Creatinine, Ser  Date/Time Value Ref Range Status  04/26/2023 07:29 PM 9.90 (H) 0.44 - 1.00 mg/dL Final  21/30/8657 84:69 PM 8.51 (H) 0.44 - 1.00 mg/dL Final  62/95/2841 32:44 PM 3.91 (H) 0.44 - 1.00 mg/dL Final  08/12/7251 66:44 PM 5.98 (H) 0.44 - 1.00 mg/dL Final  03/47/4259 56:38 AM 7.08 (H) 0.44 - 1.00 mg/dL Final  75/64/3329 51:88 AM 4.90 (H) 0.44 - 1.00 mg/dL Final  41/66/0630 16:01 AM 7.20 (H) 0.44 - 1.00 mg/dL Final  09/32/3557 32:20 AM 5.42 (H) 0.44 - 1.00 mg/dL Final  25/42/7062 37:62 AM 8.29 (H) 0.44 - 1.00 mg/dL Final  83/15/1761 60:73 AM 5.54 (H) 0.44 -  1.00 mg/dL Final  25/95/6387 56:43 AM 10.09 (H) 0.44 - 1.00 mg/dL Final  32/95/1884 16:60 PM 9.55 (H) 0.44 - 1.00 mg/dL Final  63/08/6008 93:23 AM 4.30 (H) 0.44 - 1.00 mg/dL Final  55/73/2202 54:27 AM 5.13 (H) 0.44 - 1.00 mg/dL Final  01/31/7627 31:51 AM 7.23 (H) 0.44 - 1.00 mg/dL Final  76/16/0737 10:62 PM 6.25 (H) 0.44 - 1.00 mg/dL Final  69/48/5462 70:35 PM 6.90 (H) 0.44 - 1.00 mg/dL Final  00/93/8182 99:37 AM 6.10 (H) 0.44 - 1.00 mg/dL Final  16/96/7893 81:01 AM 9.94 (H) 0.44 - 1.00 mg/dL Final  75/05/2584 27:78 AM 8.02 (H) 0.44 - 1.00 mg/dL Final  24/23/5361 44:31 AM 6.10 (H) 0.44 - 1.00 mg/dL Final  54/00/8676 19:50 AM 5.57 (H) 0.44 - 1.00 mg/dL Final  93/26/7124 58:09 PM 9.10 (H) 0.44 - 1.00 mg/dL Final  98/33/8250 53:97 AM 5.18 (H) 0.44  - 1.00 mg/dL Final  67/34/1937 90:24 AM 5.00 (H) 0.44 - 1.00 mg/dL Final  09/73/5329 92:42 PM 6.25 (H) 0.44 - 1.00 mg/dL Final  68/34/1962 22:97 PM 7.00 (H) 0.44 - 1.00 mg/dL Final  98/92/1194 17:40 AM 6.64 (H) 0.44 - 1.00 mg/dL Final  81/44/8185 63:14 PM 4.53 (H) 0.44 - 1.00 mg/dL Final  97/09/6376 58:85 PM 7.01 (H) 0.44 - 1.00 mg/dL Final  02/77/4128 78:67 PM 4.21 (H) 0.44 - 1.00 mg/dL Final  67/20/9470 96:28 AM 5.49 (H) 0.44 - 1.00 mg/dL Final  36/62/9476 54:65 AM 5.00 (H) 0.44 - 1.00 mg/dL Final  03/54/6568 12:75 AM 9.83 (H) 0.44 - 1.00 mg/dL Final  17/00/1749 44:96 PM 8.96 (H) 0.44 - 1.00 mg/dL Final  75/91/6384 66:59 PM 9.25 (H) 0.44 - 1.00 mg/dL Final  93/57/0177 93:90 PM 10.66 (H) 0.44 - 1.00 mg/dL Final  30/04/2329 07:62 AM 12.67 (H) 0.44 - 1.00 mg/dL Final  26/33/3545 62:56 AM 6.61 (H) 0.44 - 1.00 mg/dL Final    Comment:    DELTA CHECK NOTED  11/10/2021 12:24 AM 10.25 (H) 0.44 - 1.00 mg/dL Final  38/93/7342 87:68 PM 10.29 (H) 0.44 - 1.00 mg/dL Final  11/57/2620 35:59 AM 12.12 (H) 0.44 - 1.00 mg/dL Final  74/16/3845 36:46 PM 11.86 (H) 0.44 - 1.00 mg/dL Final  80/32/1224 82:50 AM 7.22 (H) 0.44 - 1.00 mg/dL Final  03/70/4888 91:69 AM 5.55 (H) 0.44 - 1.00 mg/dL Final    Comment:    DELTA CHECK NOTED DIALYSIS   05/20/2021 12:14 PM 9.25 (H) 0.44 - 1.00 mg/dL Final  45/10/8880 80:03 AM 8.85 (H) 0.44 - 1.00 mg/dL Final  49/17/9150 56:97 AM 9.27 (H) 0.44 - 1.00 mg/dL Final  94/80/1655 37:48 AM 8.12 (H) 0.44 - 1.00 mg/dL Final  27/02/8674 44:92 AM 5.87 (H) 0.44 - 1.00 mg/dL Final    Comment:    DELTA CHECK NOTED  04/19/2021 06:05 AM 10.68 (H) 0.44 - 1.00 mg/dL Final  01/00/7121 97:58 AM 10.38 (H) 0.44 - 1.00 mg/dL Final   Recent Labs  Lab 04/20/23 1235 04/20/23 1321 04/26/23 1926 04/26/23 1929  NA 136 137 139 139  K 3.5 3.9 7.0* 6.9*  CL 96*  --  102 114*  CO2 22  --  13*  --   GLUCOSE 94  --  107* 104*  BUN 26*  --  81* 74*  CREATININE 3.91*  --  8.51* 9.90*   CALCIUM 9.6  --  8.2*  --    Recent Labs  Lab 04/20/23 1241 04/20/23 1321 04/26/23 1926 04/26/23 1929  WBC 5.4  --  6.0  --  NEUTROABS 3.7  --  3.9  --   HGB 11.7* 14.6 11.2* 13.3  HCT 33.3* 43.0 37.6 39.0  MCV 93.8  --  92.8  --   PLT 145*  --  244  --    Liver Function Tests: Recent Labs  Lab 04/20/23 1235 04/26/23 1926  AST 14* 14*  ALT 11 10  ALKPHOS 86 73  BILITOT 0.4 0.7  PROT 8.9* 8.3*  ALBUMIN 3.7 3.4*   No results for input(s): "LIPASE", "AMYLASE" in the last 168 hours. Recent Labs  Lab 04/20/23 1241  AMMONIA 16   Cardiac Enzymes: No results for input(s): "CKTOTAL", "CKMB", "CKMBINDEX", "TROPONINI" in the last 168 hours. Iron Studies: No results for input(s): "IRON", "TIBC", "TRANSFERRIN", "FERRITIN" in the last 72 hours. PT/INR: @LABRCNTIP (inr:5)  Xrays/Other Studies: ) Results for orders placed or performed during the hospital encounter of 04/26/23 (from the past 48 hour(s))  CBG monitoring, ED     Status: Abnormal   Collection Time: 04/26/23  7:24 PM  Result Value Ref Range   Glucose-Capillary 101 (H) 70 - 99 mg/dL    Comment: Glucose reference range applies only to samples taken after fasting for at least 8 hours.  Protime-INR     Status: None   Collection Time: 04/26/23  7:26 PM  Result Value Ref Range   Prothrombin Time 14.9 11.4 - 15.2 seconds   INR 1.1 0.8 - 1.2    Comment: (NOTE) INR goal varies based on device and disease states. Performed at [facility] Lab, [address] [place], [place] [address]   APTT     Status: None   Collection Time: 04/26/23  7:26 PM  Result Value Ref Range   aPTT 36 24 - 36 seconds    Comment: Performed at [facility], [address], [place], [place] [address]  CBC     Status: Abnormal   Collection Time: 04/26/23  7:26 PM  Result Value Ref Range   WBC 6.0 4.0 - 10.5 K/uL   RBC 4.05 3.87 - 5.11 MIL/uL   Hemoglobin 11.2 (L) 12.0 - 15.0 g/dL   HCT 18.8 41.6 - 60.6 %   MCV 92.8 80.0 - 100.0  fL   MCH 27.7 26.0 - 34.0 pg   MCHC 29.8 (L) 30.0 - 36.0 g/dL   RDW 30.1 (H) 60.1 - 09.3 %   Platelets 244 150 - 400 K/uL   nRBC 0.0 0.0 - 0.2 %    Comment: Performed at [facility] Lab, [address], [place], [place] [address]  Differential     Status: None   Collection Time: 04/26/23  7:26 PM  Result Value Ref Range   Neutrophils Relative % 65 %   Neutro Abs 3.9 1.7 - 7.7 K/uL   Lymphocytes Relative 23 %   Lymphs Abs 1.4 0.7 - 4.0 K/uL   Monocytes Relative 6 %   Monocytes Absolute 0.4 0.1 - 1.0 K/uL   Eosinophils Relative 5 %   Eosinophils Absolute 0.3 0.0 - 0.5 K/uL   Basophils Relative 1 %   Basophils Absolute 0.1 0.0 - 0.1 K/uL   Immature Granulocytes 0 %   Abs Immature Granulocytes 0.01 0.00 - 0.07 K/uL    Comment: Performed at [facility], [address] [place], [place] [address]  Comprehensive metabolic panel     Status: Abnormal   Collection Time: 04/26/23  7:26 PM  Result Value Ref Range   Sodium 139 135 - 145 mmol/L  Potassium 7.0 (HH) 3.5 - 5.1 mmol/L    Comment: CRITICAL RESULT CALLED TO, READ BACK BY AND VERIFIED WITH Piedad Climes 2050 04/26/2023 WBOND   Chloride 102 98 - 111 mmol/L   CO2 13 (L) 22 - 32 mmol/L   Glucose, Bld 107 (H) 70 - 99 mg/dL    Comment: Glucose reference range applies only to samples taken after fasting for at least 8 hours.   BUN 81 (H) 6 - 20 mg/dL   Creatinine, Ser 1.61 (H) 0.44 - 1.00 mg/dL   Calcium 8.2 (L) 8.9 - 10.3 mg/dL   Total Protein 8.3 (H) 6.5 - 8.1 g/dL   Albumin 3.4 (L) 3.5 - 5.0 g/dL   AST 14 (L) 15 - 41 U/L   ALT 10 0 - 44 U/L   Alkaline Phosphatase 73 38 - 126 U/L   Total Bilirubin 0.7 0.3 - 1.2 mg/dL   GFR, Estimated 5 (L) >60 mL/min    Comment: (NOTE) Calculated using the CKD-EPI Creatinine Equation (2021)    Anion gap 24 (H) 5 - 15    Comment: ELECTROLYTES REPEATED TO VERIFY Performed at [facility], [address], [place], [place] [address]   Ethanol     Status: None   Collection Time:  04/26/23  7:26 PM  Result Value Ref Range   Alcohol, Ethyl (B) <10 <10 mg/dL    Comment: (NOTE) Lowest detectable limit for serum alcohol is 10 mg/dL.  For medical purposes only. Performed at [facility], [address] [place], [place] [address]   I-stat chem 8, ED     Status: Abnormal   Collection Time: 04/26/23  7:29 PM  Result Value Ref Range   Sodium 139 135 - 145 mmol/L   Potassium 6.9 (HH) 3.5 - 5.1 mmol/L   Chloride 114 (H) 98 - 111 mmol/L   BUN 74 (H) 6 - 20 mg/dL   Creatinine, Ser 1.19 (H) 0.44 - 1.00 mg/dL   Glucose, Bld 147 (H) 70 - 99 mg/dL    Comment: Glucose reference range applies only to samples taken after fasting for at least 8 hours.   Calcium, Ion 0.88 (LL) 1.15 - 1.40 mmol/L   TCO2 16 (L) 22 - 32 mmol/L   Hemoglobin 13.3 12.0 - 15.0 g/dL   HCT 82.9 56.2 - 13.0 %   Comment NOTIFIED PHYSICIAN    *Note: Due to a large number of results and/or encounters for the requested time period, some results have not been displayed. A complete set of results can be found in Results Review.   CT HEAD CODE STROKE WO CONTRAST  Result Date: 04/26/2023 CLINICAL DATA:  Code stroke. Neuro deficit, acute, stroke suspected. Left-sided weakness and slurred speech. EXAM: CT HEAD WITHOUT CONTRAST TECHNIQUE: Contiguous axial images were obtained from the base of the skull through the vertex without intravenous contrast. RADIATION DOSE REDUCTION: This exam was performed according to the departmental dose-optimization program which includes automated exposure control, adjustment of the mA and/or kV according to patient size and/or use of iterative reconstruction technique. COMPARISON:  Head CT 04/20/2023. FINDINGS: Brain: No acute hemorrhage. Unchanged background of moderate chronic small-vessel disease with old cortical infarct in the right occipital lobe, old perforator infarcts in the right lentiform nucleus and corona radiata, as well as the bilateral thalami. No new loss of  gray-white differentiation. No hydrocephalus or extra-axial collection. No mass effect or midline shift. Vascular: No hyperdense vessel or unexpected calcification. Skull: No calvarial fracture or suspicious  bone lesion. Skull base is unremarkable. Sinuses/Orbits: No acute finding. Other: None. ASPECTS [facility] Stroke Program Early CT Score) - Ganglionic level infarction (caudate, lentiform nuclei, internal capsule, insula, M1-M3 cortex): 7 - Supraganglionic infarction (M4-M6 cortex): 3 Total score (0-10 with 10 being normal): 10 IMPRESSION: 1. No acute intracranial hemorrhage or evidence of acute large vessel territory infarct. ASPECT score is 10. 2. Unchanged background of moderate chronic small-vessel disease with old cortical infarct in the right occipital lobe, old perforator infarcts in the right lentiform nucleus and corona radiata, as well as the bilateral thalami. Code stroke imaging results were communicated on 04/26/2023 at 7:47 pm to provider Dr. Otelia Limes via secure text paging. Electronically Signed   By: Orvan Falconer M.D.   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 diabetic   GERD (gastroesophageal reflux disease)    Human immunodeficiency virus (HIV) disease (HCC) 04/23/2016   Hyperkalemia 10/12/2022   Hyperlipidemia    Hypertension    Hypertensive emergency 11/09/2021    Hypertensive urgency 05/20/2021   Metabolic bone disease 05/02/2019   Moderate nonproliferative diabetic retinopathy of both eyes (HCC) 11/21/2014   11/14/14: Noted on retinal imaging; needs follow-up imaging in 6 months  05/22/16: Noted on retinal imaging again; needs follow-up imaging in 6 months   PAD (peripheral artery disease) (HCC)    Type 2 diabetes mellitus with diabetic peripheral angiopathy without gangrene (HCC) 05/01/2019   Wound infection s/p L transmetatarsal amputation     PSH:   Past Surgical History:  Procedure Laterality Date   A/V SHUNTOGRAM Left 04/21/2023   Procedure: A/V SHUNTOGRAM;  Surgeon: Victorino Sparrow, MD;  Location: [facility] INVASIVE CV LAB;  Service: Cardiovascular;  Laterality: Left;   ABDOMINAL AORTOGRAM W/LOWER EXTREMITY N/A 12/13/2020   Procedure: ABDOMINAL AORTOGRAM W/LOWER EXTREMITY;  Surgeon: Chuck Hint, MD;  [facility] INVASIVE CV LAB;  Service: Cardiovascular;  Laterality: N/A;   AMPUTATION Left 07/08/2018   Procedure: AMPUTATION FORTH RAY LEFT FOOT;  Surgeon: Nadara Mustard, MD;  Location: [facility] OR;  Service: Orthopedics;  Laterality: Left;   AMPUTATION Left 08/09/2018   Procedure: Left Transmetatarsal Amputation;  Surgeon: Nadara Mustard, MD;  Location: [facility] OR;  Service: Orthopedics;  Laterality: Left;   AMPUTATION Left 10/08/2018   Procedure: LEFT BELOW KNEE AMPUTATION;  Surgeon: Nadara Mustard, MD;  Location: [facility] OR;  Service: Orthopedics;  Laterality: Left;   AMPUTATION Left 10/28/2018   Procedure: REVISION BELOW KNEE AMPUTATION;  Surgeon: Nadara Mustard, MD;  Location: [facility] OR;  Service: Orthopedics;  Laterality: Left;   AMPUTATION Left 12/16/2018   Procedure: LEFT ABOVE KNEE AMPUTATION;  Surgeon: Nadara Mustard, MD;  Location: [facility] OR;  Service: Orthopedics;  Laterality: Left;   AMPUTATION Right 01/31/2021   Procedure: AMPUTATION  BELOW KNEE;  Surgeon: Nada Libman, MD;  Location: [facility] OR;  Service: Vascular;  Laterality: Right;   AMPUTATION Right  03/28/2021   Procedure: REVISION OF RIGHT BELOW KNEE AMPUTATION;  Surgeon: Nada Libman, MD;  Location: [facility] OR;  Service: Vascular;  Laterality: Right;   AMPUTATION Right 04/18/2021   Procedure: right below knee amputation/washout placement wound vac;  Surgeon: Leonie Douglas, MD;  Location: [facility] OR;  Service: Vascular;  Laterality: Right;   AMPUTATION Right 02/19/2023   Procedure: CONVERSION TO ABOVE KNEE AMPUTATION;  Surgeon: Maeola Harman, MD;  Location: [facility] OR;  Service: Vascular;  Laterality: Right;   APPLICATION OF WOUND VAC Left 12/31/2022   Procedure: APPLICATION OF PREVENA WOUND VAC;  Surgeon: Victorino Sparrow, MD;  Location: [facility] OR;  Service: Vascular;  Laterality: Left;   AV FISTULA PLACEMENT Left 10/14/2018   Procedure: Arteriovenous (Av) Fistula Creation Left Arm;  Surgeon: Cephus Shelling, MD;  Location: [facility] OR;  Service: Vascular;  Laterality: Left;   BASCILIC VEIN TRANSPOSITION Left 04/14/2019   Procedure: BASILIC VEIN TRANSPOSITION SECOND STAGE LEFT ARM;  Surgeon: Larina Earthly, MD;  Location: [facility] OR;  Service: Vascular;  Laterality: Left;   BIOPSY  10/14/2022   Procedure: BIOPSY;  Surgeon: Benancio Deeds, MD;  Location: [facility] ENDOSCOPY;  Service: Gastroenterology;;   COMPLEX WOUND CLOSURE Left 12/31/2022   Procedure: COMPLEX WOUND CLOSURE;  Surgeon: Victorino Sparrow, MD;  Location: [facility] OR;  Service: Vascular;  Laterality: Left;   CORONARY STENT INTERVENTION N/A 05/21/2021   Procedure: CORONARY STENT INTERVENTION;  Surgeon: Marykay Lex, MD;  Location: [facility] INVASIVE CV LAB;  Service: Cardiovascular;  Laterality: N/A;   ESOPHAGOGASTRODUODENOSCOPY (EGD) WITH PROPOFOL N/A 10/14/2022   Procedure: ESOPHAGOGASTRODUODENOSCOPY (EGD) WITH PROPOFOL;  Surgeon: Benancio Deeds, MD;  Location: [facility] ENDOSCOPY;  Service: Gastroenterology;  Laterality: N/A;   HEMOSTASIS CLIP PLACEMENT  10/14/2022   Procedure: HEMOSTASIS CLIP PLACEMENT;  Surgeon: Benancio Deeds, MD;  Location: [facility]  ENDOSCOPY;  Service: Gastroenterology;;   INCISION AND DRAINAGE Left 12/31/2022   Procedure: INCISION AND DRAINAGE OF LEFT ARM;  Surgeon: Victorino Sparrow, MD;  Location: [facility] OR;  Service: Vascular;  Laterality: Left;   INCISION AND DRAINAGE ABSCESS N/A 10/24/2020   Procedure: exicision of hydradenitis;  Surgeon: Romie Levee, MD;  Location: [facility];  Service: General;  Laterality: N/A;  45 min   INSERTION OF DIALYSIS CATHETER Right 04/14/2019   Procedure: INSERTION OF DIALYSIS CATHETER;  Surgeon: Larina Earthly, MD;  Location: [facility] OR;  Service: Vascular;  Laterality: Right;   IR FLUORO GUIDE CV LINE RIGHT  10/13/2022   IR REMOVAL TUN CV CATH W/O FL  10/16/2022   IR US GUIDE VASC ACCESS RIGHT  10/13/2022   LEFT HEART CATH AND CORONARY ANGIOGRAPHY N/A 05/21/2021   Procedure: LEFT HEART CATH AND CORONARY ANGIOGRAPHY;  Surgeon: Marykay Lex, MD;  Location: [facility] INVASIVE CV LAB;  Service: Cardiovascular;  Laterality: N/A;   LOWER EXTREMITY ANGIOGRAPHY N/A 07/05/2018   Procedure: LOWER EXTREMITY ANGIOGRAPHY;  Surgeon: Nada Libman, MD;  Location: [facility] INVASIVE CV LAB;  Service: Cardiovascular;  Laterality: N/A;   PERIPHERAL VASCULAR ATHERECTOMY Right 12/13/2020   Procedure: PERIPHERAL VASCULAR ATHERECTOMY;  Surgeon: Chuck Hint, MD;  Location: [facility] INVASIVE CV LAB;  Service: Cardiovascular;  Laterality: Right;  Superficial femoral   PERIPHERAL VASCULAR BALLOON ANGIOPLASTY Left 07/05/2018   Procedure: PERIPHERAL VASCULAR BALLOON ANGIOPLASTY;  Surgeon: Nada Libman, MD;  Location: [facility]  INVASIVE CV LAB;  Service: Cardiovascular;  Laterality: Left;  SFA   PERIPHERAL VASCULAR BALLOON ANGIOPLASTY Right 12/13/2020   Procedure: PERIPHERAL VASCULAR BALLOON ANGIOPLASTY;  Surgeon: Chuck Hint, MD;  Location: [facility] INVASIVE CV LAB;  Service: Cardiovascular;  Laterality: Right;  Peroneal artery, anterior tibial artery   RECTAL EXAM UNDER ANESTHESIA N/A 10/24/2020   Procedure: EXAM UNDER  ANESTHESIA;  Surgeon: Romie Levee, MD;  Location: [facility];  Service: General;  Laterality: N/A;   STUMP REVISION Left 10/19/2018   Procedure: REVISION LEFT BELOW KNEE AMPUTATION;  Surgeon: Nadara Mustard, MD;  Location: [facility] OR;  Service: Orthopedics;  Laterality: Left;   TUBAL LIGATION  2002    Allergies: No Known Allergies  Medications:   Prior to Admission medications   Medication Sig Start Date End Date Taking? Authorizing Provider  Accu-Chek Softclix Lancets lancets Use as instructed 05/02/21   Atway, Rayann N, DO  albuterol (VENTOLIN HFA) 108 (90 Base) MCG/ACT inhaler Inhale 2 puffs into the lungs every 4 (four) hours as needed for wheezing or shortness of breath. 02/14/21   Angiulli, Mcarthur Rossetti, PA-C  amLODipine-olmesartan (AZOR) 10-40 MG tablet Take 1 tablet by mouth every evening. 02/09/23   [provider]  aspirin EC (EQ ASPIRIN ADULT LOW DOSE) 81 MG tablet Take 1 tablet (81 mg total) by mouth daily. Swallow whole. 03/02/23   Kathleen Lime, MD  atorvastatin (LIPITOR) 80 MG tablet Take 1 tablet (80 mg total) by mouth daily. 02/01/23   Willette Cluster, MD  bictegravir-emtricitabine-tenofovir AF (BIKTARVY) 50-200-25 MG TABS tablet Take 1 tablet by mouth daily. OVERDUE FOR OFFICE FOLLOW UP - PLEASE CALL OUR OFFICE TO SCHEDULE [phone] 02/23/23   Veryl Speak, FNP  busPIRone (BUSPAR) 5 MG tablet Take 0.5 tablets (2.5 mg total) by mouth 2 (two) times daily. 04/21/23 07/20/23  Modena Slater, DO  calcitRIOL (ROCALTROL) 0.25 MCG capsule Take 5 capsules (1.25 mcg total) by mouth Every Tuesday,Thursday,and Saturday with dialysis. 07/02/22   Marolyn Haller, MD  calcium acetate (PHOSLO) 667 MG capsule Take 2 capsules (1,334 mg total) by mouth 3 (three) times daily with meals. 06/30/22   Marolyn Haller, MD  Continuous Blood Gluc Sensor (DEXCOM G7 SENSOR) MISC Use to check blood sugar continuously 06/30/22   Marolyn Haller, MD  Continuous Glucose Receiver (DEXCOM G7  RECEIVER) DEVI One box 03/02/23   Kathleen Lime, MD  cyclobenzaprine (FLEXERIL) 5 MG tablet Take 5 mg by mouth 2 (two) times daily as needed for muscle spasms.    [provider]  Darbepoetin Alfa (ARANESP) 200 MCG/0.4ML SOSY injection Inject 0.4 mLs (200 mcg total) into the skin every Monday at 6 PM. Patient taking differently: Inject 200 mcg into the skin every 7 (seven) days. Every Monday 07/06/22   Marolyn Haller, MD  escitalopram (LEXAPRO) 5 MG tablet Take 1 tablet (5 mg total) by mouth daily. 02/01/23   Willette Cluster, MD  gabapentin (NEURONTIN) 100 MG capsule Take 100 mg by mouth at bedtime. 09/10/22   [provider]  glucose blood test strip Use as instructed 05/02/21   Atway, Rayann N, DO  hydrocortisone-pramoxine (PROCTOFOAM-HC) rectal foam Place 1 applicator rectally 2 (two) times daily. Patient not taking: Reported on 04/16/2023 11/07/22   Dione Booze, MD  insulin glargine (LANTUS SOLOSTAR) 100 UNIT/ML Solostar Pen Inject 15 Units into the skin at bedtime. 02/01/23   Willette Cluster, MD  Insulin Pen Needle (PENTIPS) 32G X 4 MM MISC Use as directed 11/10/22   Quincy Simmonds, MD  lidocaine (LIDODERM) 5 % Place 1 patch onto the skin daily. Remove & Discard patch within 12 hours or as directed by MD Patient taking differently: Place 1 patch onto the skin daily as needed (pain). Remove & Discard patch within 12 hours or as directed by MD 04/06/23   Nooruddin, Jason Fila, MD  liraglutide (VICTOZA) 18 MG/3ML SOPN Inject 0.6 mg into the skin daily. 03/02/23   Kathleen Lime, MD  LOKELMA 5 g packet Take 5 g by mouth daily. 07/30/22   [provider]  multivitamin (RENA-VIT) TABS tablet Take 1 tablet by mouth daily. Patient taking differently: Take 1 tablet by mouth See admin instructions. On Tuesday, Wednesday and Thursday 02/14/21   Angiulli, Mcarthur Rossetti, PA-C  ondansetron (ZOFRAN) 8 MG tablet Take 1 tablet (8 mg total) by mouth every 8 (eight) hours as needed for nausea or vomiting.  12/25/22   Marrianne Mood, MD  ondansetron (ZOFRAN-ODT) 8 MG disintegrating tablet Take 1 tablet (8 mg total) by mouth every 8 (eight) hours as needed for nausea or vomiting. Patient not taking: Reported on 04/16/2023 02/25/23   Cathren Laine, MD  pantoprazole (PROTONIX) 40 MG tablet Take 1 tablet (40 mg total) by mouth 2 (two) times daily. 12/25/22   Marrianne Mood, MD  polyethylene glycol (MIRALAX / GLYCOLAX) 17 g packet Take 17 g by mouth 2 (two) times daily. Patient taking differently: Take 17 g by mouth daily as needed for moderate constipation. 12/25/22   Marrianne Mood, MD  senna (SENOKOT) 8.6 MG TABS tablet Take 1 tablet (8.6 mg total) by mouth daily as needed for mild constipation. 03/02/23   Kathleen Lime, MD  senna-docusate (SENOKOT-S) 8.6-50 MG tablet Take 1 tablet by mouth at bedtime. 12/25/22   Marrianne Mood, MD  sertraline (ZOLOFT) 25 MG tablet Take 25 mg by mouth daily. 03/12/23   [provider]  Ubrogepant (UBRELVY) 50 MG TABS Take 1 tablet (50 mg total) by mouth as needed (migraine headache). if needed a second dose may be taken at least 2 hours after the initial dose; MAX 8 doses in a month 01/25/23   Quincy Simmonds, MD    Discontinued Meds:  There are no discontinued medications.  Social History:  reports that she quit smoking about 8 years ago. Her smoking use included cigarettes. She started smoking about 9 years ago. She has a 1 pack-year smoking history. She has never used smokeless tobacco. She reports that she does not currently use alcohol. She reports that she does not currently use drugs after having used the following drugs: Marijuana.  Family History:   Family History  Problem Relation Age of Onset   Diabetes Mother    Diabetes Brother    Diabetes Daughter    Diabetes Daughter    Mental retardation Brother        died from PNA   Diabetes Maternal Grandmother     Blood pressure (!) 147/109, pulse 99, resp. rate 15, weight 74 kg, last menstrual  period 04/20/2023, SpO2 100%. General: NCAT  Heart:tachy, no mrg Lungs:CTAB, nml WOB, no rales noted Abdomen:soft, NTND Extremities:no LE edema Dialysis Access: LU AVG +b/t        Ethelene Hal, MD 04/26/2023, 9:14 PM

## 2023-04-26 NOTE — ED Notes (Signed)
Date and time results received: 04/26/23 2049 (use smartphrase ".now" to insert current time)  Test: Potassium Critical Value: 7.0  Name of Provider Notified: dixon   Orders Received? Or Actions Taken?:

## 2023-04-26 NOTE — ED Notes (Signed)
[phone] Demetris pt mother called for a update

## 2023-04-26 NOTE — H&P (Incomplete)
Date: 04/27/2023               Patient Name:  Kathy Frank MRN: [id number]  DOB: 03-05-1976 Age / Sex: 47 y.o., female   PCP: Jeral Pinch, DO         Medical Service: Internal Medicine Teaching Service         Attending Physician: Dr. Reymundo Poll, MD    First Contact: Dr. Manuela Neptune, MD Pager: [phone]    Second Contact: Dr. Marrianne Mood, MD Pager: [phone]       After Hours (After 5p/  First Contact Pager: [phone]  weekends / holidays): Second Contact Pager: [phone]   Chief Complaint: seizure-like activity, stroke-like symptoms  History of Present Illness:  Patient is a 47 y.o. with a past medical history of ESRD on HD T/Th/Sat, chronic anemia, insulin-dependent T2DM c/b diabetic retinopathy, PAD s/p R/L BKA, HTN, HIV, depression, and chronic diarrhea.   Patient seen in dialysis unit, was unable to participate in patient interview due to altered status. Attempted asking patient what happened earlier this morning, which she was unable to answer. When asked about events leading up to her hospitalization, she indicated she did not recall the EMS ride to the hospital.   Per chart review, patient's LKN was 9:30 AM on 9/16. Later in the evening, patient called out to daughter stating she was having a seizure. EMS was called, only observed mild twitching. During transit to ED, EMS reported patient developed left facial droop, left hemineglect, left hemibody weakness, and slurred speech.   Patient seen in ED 1 week ago for similar symptoms of intermittent confusion/altered status that resolved while in the ED. Patient's family at bedside in the ED report intermittent episodes of encephalopathy over past several weeks, seemed to worsen when patient started taking gabapentin again. Of note, patient has had recent, shortened dialysis sessions, ending 30 minutes early and a session last week lasting only 3 hours. Last reported session 9/14. Past few sessions has  been leaving above her EDW.    ED Course: Arrived by EMS for stroke-like symptoms on route. EMS noted markedly elevated BP 230/110, HR 102, 99% on RA and CBG in 120s. Vitals on admission showed BP 147/109, HR 99, RR 15, Sat 100% on room air. Labs significant for K 7.0, BUN 81, CO2 13, Cr 8.51, anion gap 24. CBC Hgb 11.2. IV access issues, unable to give patient calcium gluconate or insulin/dextrose to help temporize hyperkalemia. Aspirin 325 mg, Plavix 75 mg, insulin aspart 5 units with dextrose 50% solution were ordered but not given as patient was transferred to the dialysis unit from ED. Patient noted to have facial asymmetry, sensory deficit, and weakness on physical exam. Neurology consulted for further stroke work up and nephrology consulted for HD.   Meds:  Unable to fully reconcile medication list due to patient being altered.  Current Facility-Administered Medications for the 04/26/23 encounter [facility] Encounter)  Medication   0.9 %  sodium chloride infusion   sodium chloride flush (NS) 0.9 % injection 3 mL   sodium chloride flush (NS) 0.9 % injection 3 mL   No outpatient medications have been marked as taking for the 04/26/23 encounter [facility] Encounter).   Allergies: Allergies as of 04/26/2023   (No Known Allergies)   Past Medical History:  Diagnosis Date   Analgesic overuse headache 06/28/2021   Anemia, posthemorrhagic, acute 10/14/2022   Anxiety    CAD (coronary artery disease)    CAP (community acquired  pneumonia) 09/29/2022   CVA (cerebral vascular accident) [facility])    Depression 06/28/2006   Qualifier: Diagnosis of  By: Laveda Abbe MD, Todd     Diabetes mellitus type 2 in obese 06/28/1994   Dyspnea    uses oxygeb 2 liters per minute at dialysis   End stage renal disease on dialysis [facility])    on hemodialysis T/Th/Sat   Erosive esophagitis    Esophageal reflux    Eye redness    Fecal incontinence 02/04/2018   Gastroparesis    ? diabetic   GERD (gastroesophageal  reflux disease)    Human immunodeficiency virus (HIV) disease (HCC) 04/23/2016   Hyperkalemia 10/12/2022   Hyperlipidemia    Hypertension    Hypertensive emergency 11/09/2021   Hypertensive urgency 05/20/2021   Metabolic bone disease 05/02/2019   Moderate nonproliferative diabetic retinopathy of both eyes (HCC) 11/21/2014   11/14/14: Noted on retinal imaging; needs follow-up imaging in 6 months  05/22/16: Noted on retinal imaging again; needs follow-up imaging in 6 months   PAD (peripheral artery disease) (HCC)    Type 2 diabetes mellitus with diabetic peripheral angiopathy without gangrene (HCC) 05/01/2019   Wound infection s/p L transmetatarsal amputation    Family History:  Per chart review, multiple family members with diabetes.  Social History:  Per chart review,  Lives With: daughters in [place] Support: family Level of Function: independent of ADLs, uses wheelchair PCP: [facility], Roya Tawkaliyar, DO Tobacco: quit smoking in 2016 (<1 pack/day since age 38) Alcohol: denies  Other substances: has smoked marijuana in the past, vapes  Review of Systems: A complete ROS was negative except as per HPI.   Physical Exam: Blood pressure (!) 235/100, pulse 94, temperature 98.6 F (37 C), temperature source Oral, resp. rate 15, weight 74 kg, last menstrual period 04/20/2023, SpO2 100%. Unable to perform majority of physical exam due to limited patient cooperation/altered status. Please refer to Dr. Gloris Manchester and Dr. Fuller Mandril notes for complete physical exams.  General: Patient resting in bed with eyes closed, intermittently moving around expressing discomfort, speaking spontaneously in mostly intelligible phrases  Neuro: Patient responded to some of the questions appropriately, but not all; made a request for water; patient moving both upper extremities spontaneously; could not discern left sided droop CV: RRR, no r/m/g. Pulm: normal respiratory effort on 2.5 L via .  MSK: Patient  moving both upper extremities spontaneously unprompted.  Skin: R BKA with some crusting on the stump, no open wound; L BKA warm and dry.   Labs:     Latest Ref Rng & Units 04/26/2023    7:29 PM 04/26/2023    7:26 PM 04/20/2023    1:21 PM  CMP  Glucose 70 - 99 mg/dL 027  253    BUN 6 - 20 mg/dL 74  81    Creatinine 6.64 - 1.00 mg/dL 4.03  4.74    Sodium 259 - 145 mmol/L 139  139  137   Potassium 3.5 - 5.1 mmol/L 6.9  7.0  3.9   Chloride 98 - 111 mmol/L 114  102    CO2 22 - 32 mmol/L  13    Calcium 8.9 - 10.3 mg/dL  8.2    Total Protein 6.5 - 8.1 g/dL  8.3    Total Bilirubin 0.3 - 1.2 mg/dL  0.7    Alkaline Phos 38 - 126 U/L  73    AST 15 - 41 U/L  14    ALT 0 - 44 U/L  10         Latest Ref Rng & Units 04/26/2023    7:29 PM 04/26/2023    7:26 PM 04/20/2023    1:21 PM  CBC  WBC 4.0 - 10.5 K/uL  6.0    Hemoglobin 12.0 - 15.0 g/dL 27.2  53.6  64.4   Hematocrit 36.0 - 46.0 % 39.0  37.6  43.0   Platelets 150 - 400 K/uL  244      Differential          Component Ref Range & Units 1 d ago (04/26/23) 7 d ago (04/20/23) 2 mo ago (02/15/23) 6 mo ago (10/12/22) 6 mo ago (10/01/22) 7 mo ago (09/29/22) 10 mo ago (06/30/22)  Neutrophils Relative % % 65 69 53 64 73 77 69  Neutro Abs 1.7 - 7.7 K/uL 3.9 3.7 2.9 6.5 4.4 5.7 4.7  Lymphocytes Relative % 23 17 22 24 14 13 15   Lymphs Abs 0.7 - 4.0 K/uL 1.4 0.9 1.2 2.5 0.8 1.0 1.0  Monocytes Relative % 6 5 9 8 10 7 11   Monocytes Absolute 0.1 - 1.0 K/uL 0.4 0.3 0.5 0.8 0.6 0.5 0.7  Eosinophils Relative % 5 5 15 2 2 2 3   Eosinophils Absolute 0.0 - 0.5 K/uL 0.3 0.3 0.8 High  0.2 0.1 0.2 0.2  Basophils Relative % 1 4 1 1 1 1 1   Basophils Absolute 0.0 - 0.1 K/uL 0.1 0.2 High  0.1 0.1 0.0 0.1 0.0  Immature Granulocytes % 0  0 1 0 0 1  Abs Immature Granulocytes 0.00 - 0.07 K/uL 0.01 0.00 0.02 CM 0.06 CM 0.01 CM 0.01 CM 0.03 CM  Comment: Performed at [facility], [address] [place], [place] [address]  WBC  5.4 R 5.5 R 10.1 R 6.0 R  7.4 R 6.7 R  nRBC  0.0 R 0.0 R 0.7 High  R 0.0 R 0.0 R 0.0 R  nRBC  1 High  R       Polychromasia  PRESENT CM       RBC  3.55 Low  R 3.22 Low  R 1.97 Low  R 2.63 Low  R 2.12 Low  R 2.54 Low  R  Hemoglobin  11.7 Low  R 8.5 Low  R 6.1 Low Panic  R, CM 8.1 Low  R 6.7 Low Panic  R, CM 7.7 Low  R  HCT  33.3 Low  R 28.1 Low  R 19.7 Low  R 25.5 Low  R 20.7 Low  R 23.3 Low  R  MCV  93.8 R 87.3 R 100.0 R 97.0 R 97.6 R 91.7 R  MCH  33.0 R 26.4 R 31.0 R 30.8 R 31.6 R 30.3 R  MCHC  35.1 R 30.2 R 31.0 R 31.8 R 32.4 R 33.0 R  RDW  22.0 High  R 16.1 High  R 15.1 R 16.6 High  R 17.1 High  R 18.4 High  R  Platelets  145 Low  R 320 R 438 High  R        Ethanol negative APTT 36 PT/INR 14.9, 1.1 Hep B non reactive hCG pending   Imaging:  EKG: personally reviewed, my interpretation is NSR, diffuse peaked T waves, QTc prolongation 509 ms.  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 with a past medical history of ESRD on HD T/Th/Sat, insulin-dependent T2DM c/b diabetic retinopathy, PAD s/p R/L BKA, HTN, HIV, depression, and chronic diarrhea.    Principal  Problem:   Stroke-like symptoms Concern for uremic encephalopathy  Initial differential diagnosis concerning for CVA/TIA/hypertensive emergency/seizure/metabolic derangements. Patient out of TKA time window, patient taken to dialysis before aspirin 325 mg and plavix 75 mg were given. Neurology initially concerned for acute right MCA ischemic stroke and possible LVO given presentation and exam findings including left homonymous hemianopsia, right gaze deviation, left facial droop, decreased sensitivity on left side, only spontaneous movement of the left U and L extremities, and mildly dysarthric speech. CT/MRI/MRA imaging did not show acute findings.  Will follow neurology recommendations as symptoms may be related to TIA.   Given BUN of 81 > 74, symptoms are concerning for worsening uremia in the setting of shortened dialysis session. Similar presentation of confusion/altered mental status during hospitalization March 3/04-3/08, which improved with inpatient dialysis.   Will need to monitor mental status, given that TIA sx resolve < 24 hours. If encephalopathy is due to uremia, should see improvement following full HD session. Patient has a history of well-controlled HIV. Etiology less likely to be infectious as patient has been afebrile and WBC and CBC with diff are WNL.   - Permissive HTN x 24 hours; treat SBP > 220 or DBP > 120; after 24 hour gradually reduce BP by 20% per day to final goal of 229 673 1336/80-90 -  Hgb A1C 6.1% 2 months ago, f/u fasting lipid panel - Continue atorvastatin 80 mg daily  - F/u carotid US - F/u TTE - PT, OT eval and treat - Continue aspirin 81 mg daily, depending on TIA vs uremic encephalopathy follow recc for indefinite DAPT - Telemetry  - q4H neuro checks  - NPO until patient passes stroke swallow screen - If patient continues to be dysarthric or has dysphagia add ST eval/treat - HD sessions per nephrology   Hyperkalemia ESRD HD T, Th, Sat Potassium 7.0 on  admission, repeat 6.9. EKG with peaked T waves. Nephrology consulted, advised HD session 9/16.  - Monitor daily BMP - Continue HD sessions per nephrology   Chronic anemia Chronic anemia associated with ESRD. Hgb 13.3. Last dose of darbepoetin alfa 150 mcg given 04/08/23. Per nephrology, no additional dose needed.  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 Follow up with CTA outpatient.   Chronic medical problems:  T2DM: Monitor CBGs, placed on very sensitive SSI HTN: 24 hours of permissive hypertension,  CVA: continue atorvastatin 80 mg daily, will continue aspirin 81 mg daily tomorrow  HIV: Well controlled, continue home Biktarvy tablet  Chronic diarrhea: monitor  Psych: did  not restart as we were unable to do a full med reconciliation with the patient (buspirone, sertraline) Migraines: did not restart due to above Bernita Raisin)  Diet: NPO  DVT prophylaxis: heparin  Code: Full Code  Dispo: Admit patient to Observation with expected length of stay less than 2 midnights.  Signed: Philomena Doheny, MD 04/27/2023, 12:48 AM  After 5pm on weekdays and 1pm on weekends: On Call pager: [phone]

## 2023-04-26 NOTE — Progress Notes (Signed)
Arrived to patient room. Patient's nurse at bedside and declined VAST visit at this time. Instructed nurse to place consult when patient ready for VAST to return to assess for pIV placement. Tomasita Morrow, RN VAST

## 2023-04-26 NOTE — Hospital Course (Signed)
Kathy Frank is a 47 y.o. who has a PMH of ESRD on HD T/Th/Sat, chronic anemia, insulin-dependent T2DM c/b diabetic retinopathy, PAD s/p R/L BKA, HTN, HIV, and chronic diarrhea.   Presented to the ED with altered mental status. She was in her normal state on 04/26/2023 in the AM when she called her daughter. However, later that evening, she called her daughter again to let her know she was having seizures. She had been having similar symptoms he entire week, presenting to the ED on 9/10. At that time she was thought to have been given some prescription opiates she was altered during her HD session and EMS was called. There were no significant changes in electrolytes, EKGs, No CT evidence for acute intracranial abnormality. She was sent home. Family thought it was due to her starting Gabapentin which was then stopped. On 9/16 after Kathy Frank called her daughter, EMS was called and she was found to have mild twitching. However, she then developed a facial droop, left hemineglect, left upper and lower extremity weakness, and slurred speech. BP 230/110, HR 102, 99% on RA and CBG in 120s. Vitals on admission showed BP 147/109, HR 99, RR 15, Sat 100% on room air. Labs significant for K 7.0, BUN 81, CO2 13, Cr 8.51, anion gap 24. CBC WBC WNL Hgb 11.2 A code stroke was called. Neurology was  initially concerned for acute right MCA ischemic stroke and possible LVO given presentation and exam findings including left homonymous hemianopsia, right gaze deviation, left facial droop, decreased sensitivity on left side, only spontaneous movement of the left U and L extremities, and mildly dysarthric speech. CT/MRI/MRA imaging did not show acute findings. Nephrology was also consulted. Given her BUN of 81 her symptoms were also concerning for worsening uremia. Pt was out of TKA time window, patient taken to dialysis before aspirin 325 mg and plavix 75 mg were given.  She became less encephalopathic after her HD session and  has had no focal deficits since. Her Cr decreased from 9.9 to 4.18 and K from 7 to 3.6. She was thought to have a likely provoked seizure in the setting of her hemodialysis. No antiepileptic medications given.   Patient stated that she had been getting late to her HD sessions, and thus these have been needing to be cut short. On further interview, her transportation is sometimes late picking her up.  She uses [employer], but a TOC consult was placed and she was provided with the phone number to call them to coordinate her rides. Since her first HD session she has remained alert and oriented. She received a second session today 04/29/2023 and will need her second one 9/21.  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 Unfortunately IV access was complicated as it was not even possible with ultrasound guidance. She was not eligible for a PICC due to her ESRD. Central access was deferred.   A carotid ultrasound was obtained today. Results are pending. Please follow up and consider further assessment if ultrasound is equivocal.   Hypertension:  Bps were elevated during her stay in the 170s despite HD. For her HTN:   -Cw Azor 10-40 at home daily -Start coreg 12.5mg  daily  - Will need follow up by PCP outpatient.   For her Chronic Diarrhea:   Previously has been tested with stool culture negative for cryptosporidium and other organisms. Fecal elastase and stool osmolarity were ordered but never collected in her prior admission. These were collected today and are currently pending. It  was noted that she was discharged with several stool softeners. These were stopped and instead prescribed Psyllium and found some relief.   - cw Psyllium  -avoid stool softeners  -Follow stool elastase and osmolarity if collected during discharge, if not, please follow up with this outpatient.  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 On day two of hospitalization, the scab came off and it started bleeding. No active signs of infection were noted. She as given mupirocin ointment and she is to change the dressings daily.   -Please stop mupirocin if wound is closed  -Please monitor for signs of infection, it has oozed in the past with palpation.   For her HIV:   HIV viral load and CD4 counts are pending  She has an appointment with Infectious disease  Please make sure she follows with ID clinic   ----------------------------------------------------------------------------------

## 2023-04-27 ENCOUNTER — Other Ambulatory Visit (HOSPITAL_COMMUNITY): Payer: 59

## 2023-04-27 ENCOUNTER — Other Ambulatory Visit: Payer: Self-pay

## 2023-04-27 ENCOUNTER — Observation Stay (HOSPITAL_COMMUNITY): Payer: 59

## 2023-04-27 ENCOUNTER — Encounter (HOSPITAL_COMMUNITY): Payer: Self-pay | Admitting: Internal Medicine

## 2023-04-27 ENCOUNTER — Observation Stay (HOSPITAL_BASED_OUTPATIENT_CLINIC_OR_DEPARTMENT_OTHER): Payer: 59

## 2023-04-27 DIAGNOSIS — R392 Extrarenal uremia: Secondary | ICD-10-CM

## 2023-04-27 DIAGNOSIS — E875 Hyperkalemia: Secondary | ICD-10-CM | POA: Diagnosis not present

## 2023-04-27 DIAGNOSIS — I160 Hypertensive urgency: Secondary | ICD-10-CM

## 2023-04-27 DIAGNOSIS — R4182 Altered mental status, unspecified: Secondary | ICD-10-CM

## 2023-04-27 DIAGNOSIS — G9340 Encephalopathy, unspecified: Secondary | ICD-10-CM | POA: Diagnosis not present

## 2023-04-27 DIAGNOSIS — Z992 Dependence on renal dialysis: Secondary | ICD-10-CM | POA: Diagnosis not present

## 2023-04-27 DIAGNOSIS — N186 End stage renal disease: Secondary | ICD-10-CM

## 2023-04-27 DIAGNOSIS — R29818 Other symptoms and signs involving the nervous system: Secondary | ICD-10-CM | POA: Diagnosis not present

## 2023-04-27 DIAGNOSIS — R569 Unspecified convulsions: Secondary | ICD-10-CM | POA: Diagnosis not present

## 2023-04-27 DIAGNOSIS — R2990 Unspecified symptoms and signs involving the nervous system: Secondary | ICD-10-CM | POA: Diagnosis not present

## 2023-04-27 DIAGNOSIS — T879 Unspecified complications of amputation stump: Secondary | ICD-10-CM | POA: Diagnosis not present

## 2023-04-27 LAB — RENAL FUNCTION PANEL
Albumin: 3.6 g/dL (ref 3.5–5.0)
Anion gap: 15 (ref 5–15)
BUN: 26 mg/dL — ABNORMAL HIGH (ref 6–20)
CO2: 26 mmol/L (ref 22–32)
Calcium: 8.7 mg/dL — ABNORMAL LOW (ref 8.9–10.3)
Chloride: 94 mmol/L — ABNORMAL LOW (ref 98–111)
Creatinine, Ser: 4.18 mg/dL — ABNORMAL HIGH (ref 0.44–1.00)
GFR, Estimated: 13 mL/min — ABNORMAL LOW (ref >60)
Glucose, Bld: 73 mg/dL (ref 70–99)
Phosphorus: 4.3 mg/dL (ref 2.5–4.6)
Potassium: 3.6 mmol/L (ref 3.5–5.1)
Sodium: 135 mmol/L (ref 135–145)

## 2023-04-27 LAB — LIPID PANEL
Cholesterol: 185 mg/dL (ref 0–200)
HDL: 51 mg/dL (ref >40)
LDL Cholesterol: 113 mg/dL — ABNORMAL HIGH (ref 0–99)
Total CHOL/HDL Ratio: 3.6 ratio
Triglycerides: 107 mg/dL (ref <150)
VLDL: 21 mg/dL (ref 0–40)

## 2023-04-27 LAB — ECHOCARDIOGRAM COMPLETE BUBBLE STUDY
Area-P 1/2: 4.21 cm2
S' Lateral: 3.4 cm

## 2023-04-27 LAB — CBC
HCT: 36.6 % (ref 36.0–46.0)
Hemoglobin: 11.6 g/dL — ABNORMAL LOW (ref 12.0–15.0)
MCH: 28 pg (ref 26.0–34.0)
MCHC: 31.7 g/dL (ref 30.0–36.0)
MCV: 88.4 fL (ref 80.0–100.0)
Platelets: 250 10*3/uL (ref 150–400)
RBC: 4.14 MIL/uL (ref 3.87–5.11)
RDW: 17 % — ABNORMAL HIGH (ref 11.5–15.5)
WBC: 4.2 10*3/uL (ref 4.0–10.5)
nRBC: 0 % (ref 0.0–0.2)

## 2023-04-27 LAB — GLUCOSE, CAPILLARY
Glucose-Capillary: 74 mg/dL (ref 70–99)
Glucose-Capillary: 119 mg/dL — ABNORMAL HIGH (ref 70–99)
Glucose-Capillary: 174 mg/dL — ABNORMAL HIGH (ref 70–99)

## 2023-04-27 LAB — HCG, SERUM, QUALITATIVE: Preg, Serum: NEGATIVE

## 2023-04-27 MED ORDER — BICTEGRAVIR-EMTRICITAB-TENOFOV 50-200-25 MG PO TABS
1.0000 | ORAL_TABLET | Freq: Every day | ORAL | Status: DC
  Administered 2023-04-27 – 2023-04-29 (×3): 1 via ORAL
  Filled 2023-04-27 (×3): qty 1

## 2023-04-27 MED ORDER — IRBESARTAN 300 MG PO TABS
300.0000 mg | ORAL_TABLET | Freq: Every day | ORAL | Status: DC
  Administered 2023-04-27 – 2023-04-29 (×3): 300 mg via ORAL
  Filled 2023-04-27 (×3): qty 1

## 2023-04-27 MED ORDER — CALCIUM ACETATE (PHOS BINDER) 667 MG PO CAPS
2001.0000 mg | ORAL_CAPSULE | Freq: Three times a day (TID) | ORAL | Status: DC
  Administered 2023-04-27 – 2023-04-29 (×6): 2001 mg via ORAL
  Filled 2023-04-27 (×6): qty 3

## 2023-04-27 MED ORDER — CHLORHEXIDINE GLUCONATE CLOTH 2 % EX PADS
6.0000 | MEDICATED_PAD | CUTANEOUS | Status: AC
  Administered 2023-04-28: 6 via TOPICAL

## 2023-04-27 MED ORDER — BUSPIRONE HCL 5 MG PO TABS
2.5000 mg | ORAL_TABLET | Freq: Two times a day (BID) | ORAL | Status: DC
  Administered 2023-04-27 – 2023-04-29 (×5): 2.5 mg via ORAL
  Filled 2023-04-27 (×5): qty 0.5

## 2023-04-27 MED ORDER — AMLODIPINE BESYLATE 10 MG PO TABS
10.0000 mg | ORAL_TABLET | Freq: Every day | ORAL | Status: DC
  Administered 2023-04-27 – 2023-04-29 (×3): 10 mg via ORAL
  Filled 2023-04-27 (×3): qty 1

## 2023-04-27 MED ORDER — ATORVASTATIN CALCIUM 80 MG PO TABS
80.0000 mg | ORAL_TABLET | Freq: Every day | ORAL | Status: DC
  Administered 2023-04-27 – 2023-04-29 (×3): 80 mg via ORAL
  Filled 2023-04-27: qty 2
  Filled 2023-04-27 (×2): qty 1

## 2023-04-27 MED ORDER — ANTICOAGULANT SODIUM CITRATE 4% (200MG/5ML) IV SOLN: 5.0000 mL | Status: DC | PRN

## 2023-04-27 MED ORDER — PENTAFLUOROPROP-TETRAFLUOROETH EX AERO: 1.0000 | INHALATION_SPRAY | CUTANEOUS | Status: DC | PRN

## 2023-04-27 MED ORDER — LIDOCAINE HCL (PF) 1 % IJ SOLN: 5.0000 mL | INTRAMUSCULAR | Status: DC | PRN

## 2023-04-27 MED ORDER — INSULIN ASPART 100 UNIT/ML IJ SOLN
0.0000 [IU] | Freq: Three times a day (TID) | INTRAMUSCULAR | Status: DC
  Administered 2023-04-28 – 2023-04-29 (×2): 1 [IU] via SUBCUTANEOUS

## 2023-04-27 MED ORDER — ALTEPLASE 2 MG IJ SOLR: 2.0000 mg | Freq: Once | INTRAMUSCULAR | Status: DC | PRN

## 2023-04-27 MED ORDER — HEPARIN SODIUM (PORCINE) 1000 UNIT/ML DIALYSIS: 1000.0000 [IU] | INTRAMUSCULAR | Status: DC | PRN

## 2023-04-27 MED ORDER — LIDOCAINE-PRILOCAINE 2.5-2.5 % EX CREA: 1.0000 | TOPICAL_CREAM | CUTANEOUS | Status: DC | PRN

## 2023-04-27 MED ORDER — CYCLOBENZAPRINE HCL 10 MG PO TABS
5.0000 mg | ORAL_TABLET | Freq: Once | ORAL | Status: AC
  Administered 2023-04-27: 5 mg via ORAL
  Filled 2023-04-27: qty 1

## 2023-04-27 MED ORDER — CHLORHEXIDINE GLUCONATE CLOTH 2 % EX PADS: 6.0000 | MEDICATED_PAD | CUTANEOUS | Status: DC

## 2023-04-27 MED ORDER — ACETAMINOPHEN-CAFFEINE 500-65 MG PO TABS
1.0000 | ORAL_TABLET | Freq: Once | ORAL | Status: DC
  Filled 2023-04-27: qty 1

## 2023-04-27 MED ORDER — ESCITALOPRAM OXALATE 10 MG PO TABS
5.0000 mg | ORAL_TABLET | Freq: Every day | ORAL | Status: DC
  Administered 2023-04-27 – 2023-04-29 (×3): 5 mg via ORAL
  Filled 2023-04-27 (×3): qty 1

## 2023-04-27 MED ORDER — ASPIRIN 81 MG PO TBEC
81.0000 mg | DELAYED_RELEASE_TABLET | Freq: Every day | ORAL | Status: DC
  Administered 2023-04-28 – 2023-04-29 (×2): 81 mg via ORAL
  Filled 2023-04-27 (×2): qty 1

## 2023-04-27 NOTE — Procedures (Signed)
Patient Name: Kathy Frank  MRN: [id number]  Epilepsy Attending: Charlsie Quest  Referring Physician/Provider: Marvel Plan, MD  Date: 04/27/2023 Duration: 22.20 mins  Patient history: 47yo F with ams getting eeg to evaluate for seizure  Level of alertness: Awake  AEDs during EEG study: None  Technical aspects: This EEG study was done with scalp electrodes positioned according to the 10-20 International system of electrode placement. Electrical activity was reviewed with band pass filter of 1-70Hz , sensitivity of 7 uV/mm, display speed of 53mm/sec with a 60Hz  notched filter applied as appropriate. EEG data were recorded continuously and digitally stored.  Video monitoring was available and reviewed as appropriate.  Description: EEG showed continuous generalized polymorphic 3 to 6 Hz theta-delta slowing. Hyperventilation and photic stimulation were not performed.     ABNORMALITY - Continuous slow, generalized  IMPRESSION: This study is suggestive of moderate diffuse encephalopathy. No seizures or epileptiform discharges were seen throughout the recording.  Jennylee Uehara Annabelle Harman

## 2023-04-27 NOTE — Progress Notes (Signed)
Patient returned to room from HD

## 2023-04-27 NOTE — Progress Notes (Signed)
Patient sent off floor to HD.

## 2023-04-27 NOTE — Progress Notes (Signed)
    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  A walker will not resolve  issue with performing activities of daily living. A wheelchair will allow patient to safely perform daily activities. Patient is not able to propel themselves in the home using a standard weight wheelchair due to endurance. Patient can self propel in the lightweight wheelchair. Length of need Lifetime. Accessories: elevating leg rests (ELRs), wheel locks, extensions and anti-tippers.   04/27/23 1436

## 2023-04-27 NOTE — H&P (Incomplete)
Date: 04/27/2023               Patient Name:  Kathy Frank MRN: [id number]  DOB: 16-Aug-1975 Age / Sex: 47 y.o., female   PCP: Jeral Pinch, DO         Medical Service: Internal Medicine Teaching Service         Attending Physician: Dr. Mercie Eon, MD    First Contact: Dr. Lovie Macadamia, MD Pager: [phone]   Second Contact: Dr. Rocky Morel, DO Pager: [phone]       After Hours (After 5p/  First Contact Pager: [phone]  weekends / holidays): Second Contact Pager: [phone]   Chief Complaint: seizure-like activity, stroke-like symptoms  History of Present Illness:     Meds: *** Current Facility-Administered Medications for the 04/26/23 encounter [facility] Encounter)  Medication  . 0.9 %  sodium chloride infusion  . sodium chloride flush (NS) 0.9 % injection 3 mL  . sodium chloride flush (NS) 0.9 % injection 3 mL   No outpatient medications have been marked as taking for the 04/26/23 encounter [facility] Encounter).     Allergies: Allergies as of 04/26/2023  . (No Known Allergies)   Past Medical History:  Diagnosis Date  . Analgesic overuse headache 06/28/2021  . Anemia, posthemorrhagic, acute 10/14/2022  . Anxiety   . CAD (coronary artery disease)   . CAP (community acquired pneumonia) 09/29/2022  . CVA (cerebral vascular accident) (HCC)   . Depression 06/28/2006   Qualifier: Diagnosis of  By: Laveda Abbe MD, Tawanna Cooler    . Diabetes mellitus type 2 in obese 06/28/1994  . Dyspnea    uses oxygeb 2 liters per minute at dialysis  . End stage renal disease on dialysis [facility])    on hemodialysis T/Th/Sat  . Erosive esophagitis   . Esophageal reflux   . Eye redness   . Fecal incontinence 02/04/2018  . Gastroparesis    ? diabetic  . GERD (gastroesophageal reflux disease)   . Human immunodeficiency virus (HIV) disease (HCC) 04/23/2016  . Hyperkalemia 10/12/2022  . Hyperlipidemia   . Hypertension   . Hypertensive emergency 11/09/2021  . Hypertensive urgency  05/20/2021  . Metabolic bone disease 05/02/2019  . Moderate nonproliferative diabetic retinopathy of both eyes (HCC) 11/21/2014   11/14/14: Noted on retinal imaging; needs follow-up imaging in 6 months  05/22/16: Noted on retinal imaging again; needs follow-up imaging in 6 months  . PAD (peripheral artery disease) (HCC)   . Type 2 diabetes mellitus with diabetic peripheral angiopathy without gangrene (HCC) 05/01/2019  . Wound infection s/p L transmetatarsal amputation     Family History: ***  Social History: ***  Review of Systems: A complete ROS was negative except as per HPI. ***  Physical Exam: Blood pressure (!) 158/76, pulse 94, temperature 98.2 F (36.8 C), resp. rate 15, weight 74 kg, last menstrual period 04/20/2023, SpO2 97%. ***  EKG: personally reviewed my interpretation is***  CXR: personally reviewed my interpretation is***  Assessment & Plan by Problem: Principal Problem:   Stroke-like symptoms   Dispo: Admit patient to Observation with expected length of stay less than 2 midnights.  Signed: Philomena Doheny, MD 04/27/2023, 12:00 AM  Pager: @MYPAGER @ After 5pm on weekdays and 1pm on weekends: On Call pager: [phone]

## 2023-04-27 NOTE — Progress Notes (Signed)
Pt in HD.

## 2023-04-27 NOTE — Progress Notes (Signed)
Dear Doctor: This patient has been identified as a candidate for CVC (not appropriate for midline/PICC d/t renal function) for the following reason (s): IV therapy over 48 hours, poor veins/poor circulatory system (CHF, COPD, emphysema, diabetes, steroid use, IV drug abuse, etc.), and restarts due to phlebitis and infiltration in 24 hours If you agree, please write an order for the indicated device.   Thank you for supporting the early vascular access assessment program.

## 2023-04-27 NOTE — Progress Notes (Signed)
Consult received for PIV placement. Patient was assessed in ED for PIV placement. RN/MD were at bedside during assessment. Both notified patient will need CVC for further access d/t poor vasculature and upper arm infiltration. Secure chat sent to [facility] RN. Tomasita Morrow, RN VAST

## 2023-04-27 NOTE — Progress Notes (Addendum)
STROKE TEAM PROGRESS NOTE   BRIEF HPI [name] is a 47 y.o. female with history of CAD s/p stent placement, anemia, CAP in Feb of this year, stroke, depression, DM2, ESRD on HD, fecal incontinence, HIV, HLD, HTN, prior episodes of hypertensive emergency/urgency, diabetic retinopathy, PAD and bilateral AKA who presents to the ED via EMS as a Code Stroke for acute onset of left facial droop, slurred speech, left sided weakness and left hemineglect.   SIGNIFICANT HOSPITAL EVENTS 9/16: Admitted to the hospital, hypertensive to 235/100, hypokalemic to 7.0 9/17: Finish hemodialysis and early morning, altered mental status resolved, normokalemic, no focal deficits noted  INTERIM HISTORY/SUBJECTIVE  Patient underwent dialysis this morning -- on interview with patient and family in room, patient is alert, oriented, and participatory.  Patient is oriented to self, date, month, situation.  Notes that she "felt like my body could not catch up" as well as shaking and loss of consciousness for approximately 5 minutes.  Family member in the room witnessed seizure AM of 9/16, also endorsed loss of consciousness.  Patient did not respond to questions during this time. Eyes open. Denies history of previous seizure.  Currently feels "fine". Informed the patient that there was no evidence of acute stroke on MRI.  Neurological exam nonfocal.   OBJECTIVE  CBC    Component Value Date/Time   WBC 4.2 04/27/2023 0649   RBC 4.14 04/27/2023 0649   HGB 11.6 (L) 04/27/2023 0649   HGB 9.2 (L) 10/19/2022 1204   HCT 36.6 04/27/2023 0649   HCT 28.6 (L) 10/19/2022 1204   PLT 250 04/27/2023 0649   PLT 343 10/19/2022 1204   MCV 88.4 04/27/2023 0649   MCV 91 10/19/2022 1204   MCH 28.0 04/27/2023 0649   MCHC 31.7 04/27/2023 0649   RDW 17.0 (H) 04/27/2023 0649   RDW 16.0 (H) 10/19/2022 1204   LYMPHSABS 1.4 04/26/2023 1926   LYMPHSABS 1.6 12/23/2017 1558   MONOABS 0.4 04/26/2023 1926   EOSABS 0.3 04/26/2023  1926   EOSABS 0.2 12/23/2017 1558   BASOSABS 0.1 04/26/2023 1926   BASOSABS 0.1 12/23/2017 1558    BMET    Component Value Date/Time   NA 139 04/26/2023 1929   NA 138 12/23/2017 1558   K 6.9 (HH) 04/26/2023 1929   CL 114 (H) 04/26/2023 1929   CO2 13 (L) 04/26/2023 1926   GLUCOSE 104 (H) 04/26/2023 1929   BUN 74 (H) 04/26/2023 1929   BUN 28 (H) 12/23/2017 1558   CREATININE 9.90 (H) 04/26/2023 1929   CREATININE 12.07 (H) 05/29/2020 1155   CALCIUM 8.2 (L) 04/26/2023 1926   CALCIUM 8.5 (L) 11/25/2018 0309   GFRNONAA 5 (L) 04/26/2023 1926   GFRNONAA 79 11/14/2014 1154    IMAGING past 24 hours MR ANGIO HEAD WO CONTRAST  Result Date: 04/26/2023 CLINICAL DATA:  Stroke suspected, left-sided facial droop, right-sided gaze EXAM: MRI HEAD WITHOUT CONTRAST MRA HEAD WITHOUT CONTRAST TECHNIQUE: Multiplanar, multi-echo pulse sequences of the brain and surrounding structures were acquired without intravenous contrast. Angiographic images of the Circle of Willis were acquired using MRA technique without intravenous contrast. COMPARISON:  06/07/2019 MRI head, correlation is also made with 04/26/2023 CT head FINDINGS: MRI HEAD FINDINGS Brain: No restricted diffusion to suggest acute or subacute infarct. No acute hemorrhage, mass, mass effect, or midline shift. No hydrocephalus or extra-axial collection. Normal pituitary and craniocervical junction. Small foci of hemosiderin deposition in the left posterior frontal lobe, right occipital lobe, right basal ganglia, and bilateral  cerebellar hemispheres, likely sequela of remote hypertensive microhemorrhages. Redemonstrated remote infarct in the right basal ganglia and adjacent periventricular white matter. Additional new lacunar infarcts in the bilateral basal ganglia and thalami. Remote right parietal and occipital and left posterior frontal infarcts are new from the prior exam. Confluent T2 hyperintense signal in the periventricular white matter and pons,  likely the sequela of moderate chronic small vessel ischemic disease. Vascular: Normal arterial flow voids. Skull and upper cervical spine: Normal marrow signal. Sinuses/Orbits: Mucosal thickening in the left maxillary sinus and left sphenoid sinus. No acute finding in the orbits. Other: Trace fluid in left mastoid air cells. MRA HEAD FINDINGS Evaluation is limited by motion. Anterior circulation: In the distal extracranial left ICA, there is a possible 9 mm aneurysm (series 22, image 16), with a tortuous or kinked vessel also possible. Both internal carotid arteries are patent to the termini, without significant stenosis, although evaluation of the cavernous segment is particularly motion limited. A1 segments patent. Normal anterior communicating artery. Anterior cerebral arteries are patent to their distal aspects without significant stenosis. No M1 stenosis or occlusion. Distal MCA branches perfused to their distal aspects without significant stenosis. Posterior circulation: Vertebral arteries patent to the vertebrobasilar junction without stenosis. Posterior inferior cerebral arteries patent bilaterally. Basilar patent to its distal aspect, although evaluation of the distal basilar is particularly motion limited. Superior cerebellar arteries patent proximally. Patent P1 segments. PCAs perfused to their distal aspects without significant stenosis. Anatomic variants: None significant IMPRESSION: 1. No acute intracranial process. No evidence of acute or subacute infarct. 2. No intracranial large vessel occlusion or significant stenosis. 3. Possible 9 mm aneurysm in the distal extracranial left ICA, with a tortuous or kinked vessel also possible. This could be further evaluated with a CTA. These findings were discussed by telephone on 04/26/2023 at 10:53 pm with provider ERIC LINDZEN . Electronically Signed   By: Wiliam Ke M.D.   On: 04/26/2023 22:58   MR BRAIN WO CONTRAST  Result Date: 04/26/2023 CLINICAL  DATA:  Stroke suspected, left-sided facial droop, right-sided gaze EXAM: MRI HEAD WITHOUT CONTRAST MRA HEAD WITHOUT CONTRAST TECHNIQUE: Multiplanar, multi-echo pulse sequences of the brain and surrounding structures were acquired without intravenous contrast. Angiographic images of the Circle of Willis were acquired using MRA technique without intravenous contrast. COMPARISON:  06/07/2019 MRI head, correlation is also made with 04/26/2023 CT head FINDINGS: MRI HEAD FINDINGS Brain: No restricted diffusion to suggest acute or subacute infarct. No acute hemorrhage, mass, mass effect, or midline shift. No hydrocephalus or extra-axial collection. Normal pituitary and craniocervical junction. Small foci of hemosiderin deposition in the left posterior frontal lobe, right occipital lobe, right basal ganglia, and bilateral cerebellar hemispheres, likely sequela of remote hypertensive microhemorrhages. Redemonstrated remote infarct in the right basal ganglia and adjacent periventricular white matter. Additional new lacunar infarcts in the bilateral basal ganglia and thalami. Remote right parietal and occipital and left posterior frontal infarcts are new from the prior exam. Confluent T2 hyperintense signal in the periventricular white matter and pons, likely the sequela of moderate chronic small vessel ischemic disease. Vascular: Normal arterial flow voids. Skull and upper cervical spine: Normal marrow signal. Sinuses/Orbits: Mucosal thickening in the left maxillary sinus and left sphenoid sinus. No acute finding in the orbits. Other: Trace fluid in left mastoid air cells. MRA HEAD FINDINGS Evaluation is limited by motion. Anterior circulation: In the distal extracranial left ICA, there is a possible 9 mm aneurysm (series 22, image 16), with a tortuous or kinked  vessel also possible. Both internal carotid arteries are patent to the termini, without significant stenosis, although evaluation of the cavernous segment is  particularly motion limited. A1 segments patent. Normal anterior communicating artery. Anterior cerebral arteries are patent to their distal aspects without significant stenosis. No M1 stenosis or occlusion. Distal MCA branches perfused to their distal aspects without significant stenosis. Posterior circulation: Vertebral arteries patent to the vertebrobasilar junction without stenosis. Posterior inferior cerebral arteries patent bilaterally. Basilar patent to its distal aspect, although evaluation of the distal basilar is particularly motion limited. Superior cerebellar arteries patent proximally. Patent P1 segments. PCAs perfused to their distal aspects without significant stenosis. Anatomic variants: None significant IMPRESSION: 1. No acute intracranial process. No evidence of acute or subacute infarct. 2. No intracranial large vessel occlusion or significant stenosis. 3. Possible 9 mm aneurysm in the distal extracranial left ICA, with a tortuous or kinked vessel also possible. This could be further evaluated with a CTA. These findings were discussed by telephone on 04/26/2023 at 10:53 pm with provider ERIC LINDZEN . Electronically Signed   By: Wiliam Ke M.D.   On: 04/26/2023 22:58   CT HEAD CODE STROKE WO CONTRAST  Result Date: 04/26/2023 CLINICAL DATA:  Code stroke. Neuro deficit, acute, stroke suspected. Left-sided weakness and slurred speech. EXAM: CT HEAD WITHOUT CONTRAST TECHNIQUE: Contiguous axial images were obtained from the base of the skull through the vertex without intravenous contrast. RADIATION DOSE REDUCTION: This exam was performed according to the departmental dose-optimization program which includes automated exposure control, adjustment of the mA and/or kV according to patient size and/or use of iterative reconstruction technique. COMPARISON:  Head CT 04/20/2023. FINDINGS: Brain: No acute hemorrhage. Unchanged background of moderate chronic small-vessel disease with old cortical infarct  in the right occipital lobe, old perforator infarcts in the right lentiform nucleus and corona radiata, as well as the bilateral thalami. No new loss of gray-white differentiation. No hydrocephalus or extra-axial collection. No mass effect or midline shift. Vascular: No hyperdense vessel or unexpected calcification. Skull: No calvarial fracture or suspicious bone lesion. Skull base is unremarkable. Sinuses/Orbits: No acute finding. Other: None. ASPECTS [facility] Stroke Program Early CT Score) - Ganglionic level infarction (caudate, lentiform nuclei, internal capsule, insula, M1-M3 cortex): 7 - Supraganglionic infarction (M4-M6 cortex): 3 Total score (0-10 with 10 being normal): 10 IMPRESSION: 1. No acute intracranial hemorrhage or evidence of acute large vessel territory infarct. ASPECT score is 10. 2. Unchanged background of moderate chronic small-vessel disease with old cortical infarct in the right occipital lobe, old perforator infarcts in the right lentiform nucleus and corona radiata, as well as the bilateral thalami. Code stroke imaging results were communicated on 04/26/2023 at 7:47 pm to provider Dr. Otelia Limes via secure text paging. Electronically Signed   By: Orvan Falconer M.D.   On: 04/26/2023 19:48    Vitals:   04/27/23 0340 04/27/23 0456 04/27/23 0459 04/27/23 0740  BP: (!) 184/100 (!) 216/88 (!) 205/103 (!) 197/97  Pulse: 98 95 97 95  Resp: 14 16  16   Temp:  98.4 F (36.9 C)  98.8 F (37.1 C)  TempSrc:  Oral  Oral  SpO2:  94% 95% 100%  Weight:         PHYSICAL EXAM General: Alert, no acute distress, bilateral AKA sitting up in bed Psych:  Mood and affect appropriate for situation, somewhat tangential CV: Regular rate and rhythm on monitor Respiratory:  Regular, unlabored respirations on room air GI: Abdomen soft and nontender   NEURO:  Mental Status: AA&Ox3, patient is able to give clear and coherent history Speech/Language: speech is without dysarthria or aphasia.  Naming,  repetition, fluency, and comprehension intact.  Cranial Nerves:  II: PERRL. Visual fields full.  III, IV, VI: EOMI. Eyelids elevate symmetrically.  V: Sensation is intact to light touch and symmetrical to face.  VII: Face is symmetrical resting and smiling VIII: hearing intact to voice. IX, X: Palate elevates symmetrically. Phonation is normal.  NW:GNFAOZHY shrug 5/5. XII: tongue is midline without fasciculations. Motor: 5/5 strength to right upper and right lower limb tone, moves upper thighs (ISO bilateral AKA) Sensation: Intact to light touch bilaterally. Extinction absent to light touch to DSS.   Coordination: FTN intact bilaterally  Overall: Nonfocal.   ASSESSMENT/PLAN Patient confirmed that she has not underwent complete dialysis sessions recently, as they are cut short when the outpatient center closes. Imaging negative for acute process.  Left sided focal findings have resolved completely.  History is consistent with a seizure (shaking, loss of consciousness, postictal state.) Believe that seizure was likely precipitated by uremic encephalopathy (BUN 81 on admission).  Dialysis overnight 9/16.  Do not feel the need to add AEDs to her regimen at this time, as etiology of seizure provocation appears clear.  Would be beneficial to reach out to outpatient dialysis center, to ensure that patient gets full courses of treatment.  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  Likely provoked seizure in the setting of uremia, electrolyte derangements and hypertensive urgency, although TIA still in DDx Code Stroke CT head: No acute abnormality.  Old cortical infarct in right occipital lobe.  Old perforator infarcts in right lentiform nucleus/corona radiata as well as thalami.     MRI  No acute intracranial process. No evidence of acute or subacute infarct. MRA  No intracranial large vessel occlusion or  significant stenosis. 2D Echo EF 60-65% EEG moderate diffuse encephalopathy. No seizures or epileptiform discharges  LDL 113 HgbA1c 6.1 in 02/2023 VTE prophylaxis -subcu heparin No  aspirin 81 mg daily prior to admission, now on aspirin 81 mg daily and clopidogrel 75 mg daily for 3 weeks and then ASA alone. No AED needed now given likely provoked seizure Therapy recommendations: HH PT and OT Disposition: TBD  Uremia  Hyperkalemia  ESRD on HD T/TH/SAT Cre 9.9 -> HD ->4.18 Potassium 6.9-> HD ->3.6 Received hemodialysis early a.m. of 9/17. Recent incomplete HD sessions due to late arrivals Nephrology on board  ? 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 This could be further evaluated with a CTA. CTA head and neck  pending  Hypertensive urgency Home meds: Azor 10-40 mg, Resumed amlodipine 10 mg daily, irbesartan 300 mg daily  BP still high, 180s-230s   Primary team managing Gradually normalize BP in 2-3 days Long term goal normotensive  Diabetes type II Controlled Home meds: Insulin glargine 15 units nightly HgbA1c 6.1 in 02/2023, goal < 7.0 CBGs SSI Recommend close follow-up with PCP for better DM control  Hyperlipidemia  Home meds lipitor 80 LDL 113, goal < 70 Continue home lipitor 80 Medication compliance education provided Continue statin on discharge.   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 Pt was seen and examined.    Daughter and grandson are at the bedside. Pt sitting in bed, stated that she felt fine now at her baseline but yesterday she was shaking all over, daughter said she was shaking, with eyes open and not responsive for 5 min. Per note, pt seems to have left sided weakness, left facial droop, left neglect and right gaze after reported "seizure". MRI negative. EEG today no seizure.   Pt symptoms concerning for seizure with Todd's paralysis in the setting of uremia, hypertensive emergency, and electrolyte derangement. Of course TIA can not rule out completely given multiple stroke risk factors. Now Cre, K+ much improved after HD. Pt not recently having complete HD sessions due to late arrival. BP still high, on home meds now, recommend gradually normalize BP. MRA poor quality but concerning for 9mm mid extracranial ICA aneurysm, which could be artifact of carotid tortuosity. Will do CTA head and neck. Now on DAPT for 3 weeks and continue statin. No AED needed now given provoked seizure. Will follow  For detailed assessment and plan, please refer to above/below as I have made changes wherever appropriate.   Marvel Plan, MD PhD Stroke Neurology 04/27/2023 5:36 PM  I discussed with teaching team, pt and her daughter. I spent extensive face-to-face time with the patient, more than 50% of which was spent in counseling and coordination of care, reviewing test results, images and medication, and discussing the diagnosis, treatment plan and potential prognosis. This patient's care requiresreview of multiple databases, neurological assessment, discussion with family, other specialists and medical decision making of high complexity.      To contact Stroke Continuity provider, please refer to [email or web address]. After hours, contact General Neurology

## 2023-04-27 NOTE — Evaluation (Signed)
Physical Therapy Evaluation  Patient Details Name: Kathy Frank MRN: [id number] DOB: 04-04-1976 Today's Date: 04/27/2023  History of Present Illness  Pt is a 47 y/o female who presents to the ED via EMS as a Code Stroke for acute onset of left facial droop, slurred speech, left sided weakness and left hemineglect. MRI (-). 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  Clinical Impression  Pt admitted with above diagnosis. Pt currently with functional limitations due to the deficits listed below (see PT Problem List). At the time of PT eval pt was able to perform transfers bed<>BSC utilizing anterior/posterior transfer with modified independence. Daughter present in room and reports pt is performing transfers at her baseline of function. Noted pt with inconsistent L side inattention, poor overall attention to task, decreased awareness of deficits and safety, and slow processing. Acutely, pt will benefit from acute skilled PT to increase their independence and safety with mobility to allow discharge.           If plan is discharge home, recommend the following: A little help with walking and/or transfers;A little help with bathing/dressing/bathroom;Assistance with cooking/housework;Assist for transportation;Help with stairs or ramp for entrance   Can travel by private vehicle        Equipment Recommendations None recommended by PT  Recommendations for Other Services       Functional Status Assessment Patient has had a recent decline in their functional status and demonstrates the ability to make significant improvements in function in a reasonable and predictable amount of time.     Precautions / Restrictions Precautions Precautions: Fall Precaution  Comments: L inattention Restrictions Weight Bearing Restrictions: No      Mobility  Bed Mobility Overal bed mobility: Modified Independent Bed Mobility: Supine to Sit           General bed mobility comments: Maneuvering around the bed without assistance, including supine to sit and scoot out to EOB.    Transfers Overall transfer level: Modified independent Equipment used: None               General transfer comment: A<>P transfer from bed to [facility], and upon return to bed, pt weight bearing through residual limbs to move anteriorly until she could pivot her hips around to the bed. No assist required for any aspect of transfer.    Ambulation/Gait               General Gait Details: Nonambulatory at baseline.  Stairs            Wheelchair Mobility     Tilt Bed    Modified Rankin (Stroke Patients Only)       Balance Overall balance assessment: Needs assistance, History of Falls   Sitting balance-Leahy Scale: Fair                                       Pertinent Vitals/Pain Pain Assessment Pain Assessment: No/denies pain    Home Living Family/patient expects to be discharged to:: Private residence Living Arrangements: Children Available Help at Discharge: Family Type of Home: Apartment Home Access: Level entry       Home Layout: One level Home Equipment: Event organiser -  manual;Wheelchair - power (states she is getting a power chair 9/30.)      Prior Function Prior Level of Function : Independent/Modified Independent             Mobility Comments: uses an ant/post transfer technique due to w/c arm not removable ADLs Comments: baths at bed level after daughter sets her up     Extremity/Trunk Assessment   Upper Extremity Assessment Upper Extremity Assessment: Defer to OT evaluation LUE Deficits / Details: generalized weakness. stronger proximally. weak gross grasp. unablet o oppose thumb to digits,  difficulty using functionally.  absetn in-hand manipulation skills. able to touch top of head with L hand and finger to nose however clumsy LUE Sensation: decreased light touch;decreased proprioception LUE Coordination: decreased fine motor;decreased gross motor    Lower Extremity Assessment Lower Extremity Assessment: RLE deficits/detail;LLE deficits/detail RLE Deficits / Details: Bilateral AKA    Cervical / Trunk Assessment Cervical / Trunk Assessment: Normal  Communication   Communication Communication: No apparent difficulties  Cognition Arousal: Alert Behavior During Therapy: Restless, Impulsive Overall Cognitive Status: Impaired/Different from baseline Area of Impairment: Attention, Memory, Safety/judgement, Awareness, Problem solving, Following commands                   Current Attention Level: Sustained   Following Commands: Follows one step commands inconsistently Safety/Judgement: Decreased awareness of safety, Decreased awareness of deficits Awareness: Intellectual Problem Solving: Slow processing, Decreased initiation          General Comments General comments (skin integrity, edema, etc.): Pt motiavted to wash up during session. Able to weight shift in sitting to wash between legs.    Exercises     Assessment/Plan    PT Assessment Patient needs continued PT services  PT Problem List Decreased strength;Decreased activity tolerance;Decreased mobility;Decreased balance;Decreased cognition;Decreased knowledge of use of DME;Decreased safety awareness       PT Treatment Interventions DME instruction;Functional mobility training;Therapeutic activities;Therapeutic exercise;Balance training;Cognitive remediation;Neuromuscular re-education;Patient/family education    PT Goals (Current goals can be found in the Care Plan section)  Acute Rehab PT Goals Patient Stated Goal: Return home PT Goal Formulation: With patient/family Time For Goal Achievement:  05/04/23 Potential to Achieve Goals: Good    Frequency Min 1X/week     Co-evaluation               AM-PAC PT "6 Clicks" Mobility  Outcome Measure Help needed turning from your back to your side while in a flat bed without using bedrails?: None Help needed moving from lying on your back to sitting on the side of a flat bed without using bedrails?: None Help needed moving to and from a bed to a chair (including a wheelchair)?: None Help needed standing up from a chair using your arms (e.g., wheelchair or bedside chair)?: Total Help needed to walk in hospital room?: Total Help needed climbing 3-5 steps with a railing? : Total 6 Click Score: 15    End of Session Equipment Utilized During Treatment: Gait belt Activity Tolerance: Patient tolerated treatment well Patient left: in bed;with call bell/phone within reach;with family/visitor present Nurse Communication: Mobility status PT Visit Diagnosis: Other abnormalities of gait and mobility (R26.89);Muscle weakness (generalized) (M62.81)    Time: 4098-1191 PT Time Calculation (min) (ACUTE ONLY): 29 min   Charges:   PT Evaluation $PT Eval Low Complexity: 1 Low PT Treatments $Therapeutic Activity: 8-22 mins PT General Charges $$ ACUTE PT VISIT: 1 Visit         Conni Slipper, PT, DPT  Acute Rehabilitation Services Secure Chat Preferred Office: [phone]   Marylynn Pearson 04/27/2023, 1:25 PM

## 2023-04-27 NOTE — Progress Notes (Addendum)
   04/27/23 0340  Vitals  BP (!) 184/100  BP Location Right Arm  BP Method Automatic  Patient Position (if appropriate) Lying  Pulse Rate 98  Pulse Rate Source Monitor  ECG Heart Rate 97  Resp 14  During Treatment Monitoring  Blood Flow Rate (mL/min) 299 mL/min  Arterial Pressure (mmHg) -151.51 mmHg  Venous Pressure (mmHg) 147.46 mmHg  TMP (mmHg) 32.93 mmHg  Ultrafiltration Rate (mL/min) 1192 mL/min  Dialysate Flow Rate (mL/min) 299 ml/min  Dialysate Potassium Concentration 2  Dialysate Calcium Concentration 2.5  Duration of HD Treatment -hour(s) 3.73 hour(s)  Cumulative Fluid Removed (mL) per Treatment  3183.12  HD Safety Checks Performed Yes  Intra-Hemodialysis Comments Tx completed  Post Treatment  Liters Processed 74  Fluid Removed (mL) 3400 mL  Tolerated HD Treatment Yes  Post-Hemodialysis Comments Pt ressless but stable  Note  Patient Observations Pt stable   Pt restless throughout treatment. Pt calm at the end. Transported to 3 west report given to  I [employer]

## 2023-04-27 NOTE — Progress Notes (Signed)
  Echocardiogram 2D Echocardiogram has been performed.  Kathy Frank 04/27/2023, 3:50 PM

## 2023-04-27 NOTE — Progress Notes (Signed)
HD Progress note: - initially refused to start HD because she claimed she needs her migraine medication first. - when medications is available - she refused to take it. - noted that patient refused to do dialysis instead. - wants to go back to her room, claimed she wants to use the bathroom and refuses to use a bedpan. - Nephrologist was informed via secure chat.

## 2023-04-27 NOTE — Progress Notes (Signed)
CT tech called re: patient's CT order. They are unable to perform CT until patient has IV access. MD's aware patient has no IV access.

## 2023-04-27 NOTE — Progress Notes (Signed)
Patient's CBG 74 and BP 208/86. Writer notified MD - awaiting response.

## 2023-04-27 NOTE — Progress Notes (Signed)
[facility] Progress Note   Subjective:  Completed dialysis yesterday with 3.4L removed.  Seen in room. Working with PT. Does endorse some left sided weakness.   Objective Vitals:   04/27/23 0456 04/27/23 0459 04/27/23 0740 04/27/23 0942  BP: (!) 216/88 (!) 205/103 (!) 197/97 126/86  Pulse: 95 97 95   Resp: 16  16   Temp: 98.4 F (36.9 C)  98.8 F (37.1 C)   TempSrc: Oral  Oral   SpO2: 94% 95% 100%   Weight:         Additional Objective Labs: Basic Metabolic Panel: Recent Labs  Lab 04/20/23 1235 04/20/23 1321 04/26/23 1926 04/26/23 1929 04/27/23 0649  NA 136   < > 139 139 135  K 3.5   < > 7.0* 6.9* 3.6  CL 96*  --  102 114* 94*  CO2 22  --  13*  --  26  GLUCOSE 94  --  107* 104* 73  BUN 26*  --  81* 74* 26*  CREATININE 3.91*  --  8.51* 9.90* 4.18*  CALCIUM 9.6  --  8.2*  --  8.7*  PHOS  --   --   --   --  4.3   < > = values in this interval not displayed.   CBC: Recent Labs  Lab 04/20/23 1241 04/20/23 1321 04/26/23 1926 04/26/23 1929 04/27/23 0649  WBC 5.4  --  6.0  --  4.2  NEUTROABS 3.7  --  3.9  --   --   HGB 11.7*   < > 11.2* 13.3 11.6*  HCT 33.3*   < > 37.6 39.0 36.6  MCV 93.8  --  92.8  --  88.4  PLT 145*  --  244  --  250   < > = values in this interval not displayed.   Blood Culture    Component Value Date/Time   SDES BLOOD RIGHT HAND 02/15/2023 1814   SPECREQUEST  02/15/2023 1814    BOTTLES DRAWN AEROBIC AND ANAEROBIC Blood Culture adequate volume   CULT  02/15/2023 1814    NO GROWTH 5 DAYS Performed at [facility], [address], [place], [place] [address]    REPTSTATUS 02/20/2023 FINAL 02/15/2023 1814     Physical Exam General: Alert, nad Heart: Regular rate Lungs: Normal wob  Abdomen: soft  Extremities: b/l AKAs Dialysis Access: LUE AVF +bruit   Medications:  anticoagulant sodium citrate      albuterol  2.5 mg Nebulization Once   [START ON 04/28/2023] aspirin EC  81 mg Oral Daily   atorvastatin   80 mg Oral Daily   bictegravir-emtricitabine-tenofovir AF  1 tablet Oral Daily   calcium gluconate  1 g Intravenous Once   Chlorhexidine Gluconate Cloth  6 each Topical Q0600   clopidogrel  75 mg Oral Daily   insulin aspart  5 Units Intravenous Once   And   dextrose  1 ampule Intravenous Once   heparin  5,000 Units Subcutaneous Q8H   insulin aspart  0-6 Units Subcutaneous TID WC   sodium chloride flush  3 mL Intravenous Once    Dialysis Orders: TTS [facility]  4h  400/1.5   66.9kg   2K/3Ca bath  RIJ TDC / revised AVF LUA Heparin none - started 7/06 w/ one needle in AVF and other line in the catheter - mircera 150 mcg IV q 2 wks, last on 04/08/23 - Calcitriol 0.75 mcg po three times per week   Assessment/Plan:  CVA - seen by neurology ASA, Plavix, Atorvastatin, imaging ongoing. PAD s/p R BKA stump infection earlier in the summer (7/12) -> AKA now with b/l AKA's.  ESRD - on HD TTS.  Completed dialysis this am. Next HD Thurs.  HTN - restart antihypertensives with goal MAP > 80 to maintain adequate  cerebral perfusion in the setting of a CVA; no e/o ICH. Volume - By history she is volume overloaded but will be gentle with UF to prevent drop in BP given clinical presentation. 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 No Aranesp  needed (last  given 04/08/23)  MBD ckd - CCa and phos in range. Cont po vdra and phoslo as binder.  HIV -Meds Per admit med team DM2 - per pmd Chronic diarrhea   Tomasa Blase PA-C [facility] 04/27/2023,11:28 AM

## 2023-04-27 NOTE — Progress Notes (Signed)
Occupational Therapy Evaluation  PTA pt lives at home with her 2 daughters, is modified independent with ADL @ bed level and completes transfers using an ant/post technique due to her wc not having removable armrests. Pt states she is in the process of getting a power chair. Currently receive HHPT. Pt demonstrates deficits listed below and will benefit from [facility] Acute OT to follow.     04/27/23 1000  OT Visit Information  Last OT Received On 04/27/23  Assistance Needed +1  History of Present Illness 47 y.o. female presents to the ED via EMS as a Code Stroke for acute onset of left facial droop, slurred speech, left sided weakness and left hemineglect. MRI (-). 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  Precautions  Precautions Fall  Precaution Comments L inattention  Home Living  Family/patient expects to be discharged to: Private residence  Living Arrangements Children  Available Help at Discharge Family  Type of Home Apartment  Home Access Level entry  Home Layout One level  Bathroom Shower/Tub Walk-in Pension scheme manager  (unsure)  Home Equipment Shower seat;BSC/3in1;Wheelchair - Engineer, technical sales - power (states she is gettign a power chair 9/30.)  Prior Function  Prior Level of Function  Independent/Modified Independent  Mobility Comments uses an ant/post transfer technique due to w/c arm not removable  ADLs Comments baths at bed level after daughter sets her up  Pain Assessment  Pain Assessment No/denies pain  Cognition  Arousal Alert  Behavior During Therapy Restless;Impulsive  Overall Cognitive Status Impaired/Different from baseline  Area of Impairment  Attention;Memory;Safety/judgement;Awareness;Problem solving;Following commands  Current Attention Level Sustained  Memory  (will assess)  Following Commands Follows one step commands inconsistently  Safety/Judgement Decreased awareness of safety;Decreased awareness of deficits  Awareness Intellectual  Problem Solving Slow processing;Decreased initiation  Communication  Communication No apparent difficulties  Upper Extremity Assessment  Upper Extremity Assessment Right hand dominant;LUE deficits/detail  LUE Deficits / Details generalized weakness. stronger proximally. weak gross grasp. unablet o oppose thumb to digits, difficulty using functionally.  absetn in-hand manipulation skills. able to touch top of head with L hand and finger to nose however clumsy  LUE Sensation decreased light touch;decreased proprioception  LUE Coordination decreased fine motor;decreased gross motor  Lower Extremity Assessment  Lower Extremity Assessment Defer to PT evaluation  Cervical / Trunk Assessment  Cervical / Trunk Assessment Normal  Vision- History  Baseline Vision/History 5 Retinopathy  Patient Visual Report Blurring of vision  Vision- Assessment  Vision Assessment? Yes  Eye Alignment WFL  Ocular Range of Motion [facility]  Alignment/Gaze Preference WDL  Tracking/Visual Pursuits Decreased smoothness of horizontal tracking;Decreased smoothness of vertical tracking (more difficulty tracking to R)  Saccades Additional head turns occurred during testing;Additional eye shifts occurred during testing;Decreased speed of saccadic movement  Visual Fields Left visual field deficit  Additional Comments not identifying target in L visual field  Perception  Perception Impaired  Preception Impairment Details Inattention/Neglect  Perception-Other Comments L inattention  ADL  Overall ADL's  Needs assistance/impaired  Eating/Feeding Set up  Grooming Set up;Supervision/safety  Upper Body Bathing Minimal assistance   Lower Body Bathing Minimal assistance  Upper Body Dressing  Minimal assistance  Lower Body Dressing Minimal assistance  Functional mobility during ADLs  (will further  assess. interrupted by EEG set up)  General ADL Comments bed level ADL  Bed Mobility  General bed mobility comments able to achieve long sititng independently. rolling independently  Transfers  General transfer comment Does ant/post at baseline  Balance  Overall balance assessment Needs assistance;History of Falls  Sitting balance-Leahy Scale Fair  OT - End of Session  Activity Tolerance Other (comment) (EEG to set up)  Patient left in bed;Other (comment) (with EEG tech)  Nurse Communication Mobility status;Other (comment) (concern regarding L sided weakness)  OT Assessment  OT Recommendation/Assessment Patient needs continued OT Services  OT Visit Diagnosis Other abnormalities of gait and mobility (R26.89);Muscle weakness (generalized) (M62.81);Low vision, both eyes (H54.2);Other symptoms and signs involving the nervous system (R29.898);Other symptoms and signs involving cognitive function;Hemiplegia and hemiparesis  Hemiplegia - Right/Left Left  Hemiplegia - dominant/non-dominant Non-Dominant  Hemiplegia - caused by Unspecified  OT Problem List Decreased strength;Decreased range of motion;Decreased activity tolerance;Impaired vision/perception;Decreased coordination;Decreased cognition;Decreased safety awareness;Decreased knowledge of use of DME or AE;Impaired sensation;Impaired UE functional use  OT Plan  OT Frequency (ACUTE ONLY) Min 1X/week  OT Treatment/Interventions (ACUTE ONLY) Self-care/ADL training;Therapeutic exercise;Neuromuscular education;DME and/or AE instruction;Therapeutic activities;Cognitive remediation/compensation;Visual/perceptual remediation/compensation;Patient/family education;Balance training  AM-PAC OT "6 Clicks" Daily Activity Outcome Measure (Version 2)  Help from another person eating  meals? 3  Help from another person taking care of personal grooming? 3  Help from another person toileting, which includes using toliet, bedpan, or urinal? 2  Help from another person bathing (including washing, rinsing, drying)? 2  Help from another person to put on and taking off regular upper body clothing? 3  Help from another person to put on and taking off regular lower body clothing? 2  6 Click Score 15  Progressive Mobility  What is the highest level of mobility based on the progressive mobility assessment? Level 2 (Chairfast) - Balance while sitting on edge of bed and cannot stand  Activity Moved into chair position in bed (Simultaneous filing. User may not have seen previous data.)  OT Recommendation  Recommendations for Other Services PT consult  Follow Up Recommendations Home health OT  Patient can return home with the following A little help with walking and/or transfers;A little help with bathing/dressing/bathroom;Assistance with cooking/housework;Direct supervision/assist for medications management;Direct supervision/assist for financial management;Assist for transportation;Help with stairs or ramp for entrance  Functional Status Assessent Patient has had a recent decline in their functional status and demonstrates the ability to make significant improvements in function in a reasonable and predictable amount of time.  OT Equipment Wheelchair (measurements OT);Wheelchair cushion (measurements OT)  Individuals Consulted  Consulted and Agree with Results and Recommendations Patient  Acute Rehab OT Goals  Patient Stated Goal home soon  OT Goal Formulation With patient  Time For Goal Achievement 05/11/23  Potential to Achieve Goals Good  OT Time Calculation  OT Start Time (ACUTE ONLY) 0958  OT Stop Time (ACUTE ONLY) 1020  OT Time Calculation (min) 22 min  OT General Charges  $OT Visit 1 Visit  OT Evaluation  $OT Eval Moderate Complexity 1 Mod  Aeisha Minarik, OT/L   Acute OT  Clinical Specialist Acute Rehabilitation Services Pager [phone] Office [phone]

## 2023-04-27 NOTE — Care Management Obs Status (Signed)
MEDICARE OBSERVATION STATUS NOTIFICATION   Patient Details  Name: JAQUITTA HEADDEN MRN: [id number] Date of Birth: May 07, 1976   Medicare Observation Status Notification Given:  Yes    Kermit Balo, RN 04/27/2023, 2:50 PM

## 2023-04-27 NOTE — Progress Notes (Signed)
EEG complete - results pending

## 2023-04-27 NOTE — Progress Notes (Signed)
Patient refused acetaminophen-caffiene 500-65mg  tab, see MAR.  Wasted in Immunologist, charge nurse [employer] RN witnessed.  Derrell Lolling, KDU

## 2023-04-27 NOTE — Progress Notes (Addendum)
HD#0 SUBJECTIVE:   Review of History:  Presented to the ED on 9/10 with transient AMS. PDMP thought to be most likely due to prescription opiates. She was altered during her HD session and EMS was called. No significant changes in electrolytes, EKGs, No CT evidence for acute intracranial abnormality. She was sent home. Presented last night with AMS. Her last know normal on 9/16 at 9:30AM. Per daughter she was having a seizure. This AM patient states that she did not have LOC and knew that her body was shaking. EMS observed mild twitching. She then developed a facial droop, left hemineglect, left upper and lower extremity weakness, and slurred speech. Per family, sxs were similar to those of her ED visit.  BP 230/110, HR 102, 99% on RA and CBG in 120s. Vitals on admission showed BP 147/109, HR 99, RR 15, Sat 100% on room air. Labs significant for K 7.0, BUN 81, CO2 13, Cr 8.51, anion gap 24. CBC Hgb 11.2   Interval  History:  IV access has been unsuccessful. Vascular attempt failed. Not eligible for PICC due to ESRD. She became less encephalopathic throughout the morning, and was able to pass a bedside swallow test. Able to tolerate meals and po medication. She states she stopped taking gabapentin last week because of AMS but that she ran out of phoslo and calcitriol last week.   Received one course of HD this AM. Denied migraine meds because she thought it was replacing her current Ubrelvy for her HA.    OBJECTIVE:  Vital Signs: Vitals:   04/27/23 0459 04/27/23 0740 04/27/23 0942 04/27/23 1129  BP: (!) 205/103 (!) 197/97 126/86 (!) 208/86  Pulse: 97 95  96  Resp:  16    Temp:  98.8 F (37.1 C)  98.6 F (37 C)  TempSrc:  Oral  Oral  SpO2: 95% 100%  97%  Weight:       Supplemental O2: Room Air SpO2: 97 %  Filed Weights   04/26/23 1928  Weight: 74 kg     Intake/Output Summary (Last 24 hours) at 04/27/2023 1350 Last data filed at 04/27/2023 1245 Gross per 24 hour  Intake 120 ml   Output 3460 ml  Net -3340 ml   Net IO Since Admission: -3,340 mL [04/27/23 1350]  Physical Exam: Physical Exam Constitutional:      General: She is not in acute distress.    Appearance: She is not ill-appearing.  Cardiovascular:     Rate and Rhythm: Normal rate and regular rhythm.     Heart sounds: No murmur heard.    No friction rub. No gallop.  Pulmonary:     Effort: No respiratory distress.     Breath sounds: No wheezing, rhonchi or rales.  Musculoskeletal:     Right lower leg: No edema.     Left lower leg: No edema.  Skin:    General: Skin is warm and dry.     Capillary Refill: Capillary refill takes less than 2 seconds.     Comments: BKA, stump on the right has a healing scar. An area of mechanical trauma on the central wound. No erythema or fluctuation noted.   Neurological:     Mental Status: She is alert. She is confused.     Cranial Nerves: Cranial nerves 2-12 are intact.     Motor: Weakness present. No abnormal muscle tone.     Comments: Oriented to self and time but not to place. Pleasantly sitting on the bed.  4/5 on bilateral upper extremities.  5/5 strength BLE     Patient Lines/Drains/Airways Status     Active Line/Drains/Airways     Name Placement date Placement time Site Days   Fistula / Graft Left Forearm Arteriovenous fistula 04/14/19  1642  Forearm  1474   Fistula / Graft Left Upper arm --  --  Upper arm  --   Fistula / Graft Left Upper arm --  --  Upper arm  --   Hemodialysis Catheter Right Subclavian Double lumen Permanent (Tunneled) 12/18/22  1530  Subclavian  130   Wound / Incision (Open or Dehisced) 02/15/23 Non-pressure wound Leg Proximal;Right 02/15/23  1538  Leg  71             ASSESSMENT/PLAN:  Assessment: Principal Problem:   Stroke-like symptoms  Kathy Frank is an 47 y.o. female CAD, CVA, PAD, Depression, DMT2, ESRD, HIV, and erosive esophagitis.  For interval hx please refer to the subjective section of this note.    Plan:  Encephalopathy - resolved with HD  Uremia AMS improved after HD this AM. NEXT HD SESSION IS THURSDAY. Patient also states she ran out of phoslo about a week ago. IV access unable to be attained even under ultrasound guidance. Not eligible for PICC due to ESRD. Not encephalopathic anymore and passed bedside swallow. PO meds for now. Appreciate Nephro and Neurology's recs.  -Next HD session Thursday most likely outpatient   - mircera 150 mcg IV q 2 wks, last on 04/08/23 - Calcitriol 0.75 mcg po three times per week -Restart antihypertensives (amlodipine 10mg  daily and ibesartan 300mg  daily)  -Restart phoslo and vdra  -Renal function panel tomorrow  -CBC tomorrow   Possible Aneurysm left ICA on CT -CTA ordered  ESRD, HD T/TH/Sat Anemia of Chronic Disease Plan as above, had HD this AM.  Bilateral Above Knee Amputation  Most recent one of the right 2/2 gangrene due to severe PVD, BKA to AKA 2/2 osteomyelitis 03/2023 Had serosanguinous fluid present on the right on 9/11 with manipulation. None observed today. Patient is complaining of fluctuance but leg is non-edematous, non-erythematous, or has signs of an active infection. Well healed scar with some crusting present.  -Will continue to monitor for signs of infection.   HIV  Continue with Biktarvy   T2DM  Cw SSI   HTN  Restarting htn meds due to TIA being unlikely.  Start Amlodipine 10mg  daily  Start Ibesartan 300mg  daily   Hx of Migraine HA  At home uses Ubrelvy 50mg  PRN. Excedrin PRN.  Hx of Anxiety  Buspar 2.5mg  BID  Lexapro 5mg  daily   History of CVA  Cw atorvastatin 80mg  daily and ASA 81mg  daily  Chronic Diarrhea Monitor  Signature: [facility]  Internal Medicine Resident, PGY-1 [facility]  Pager: [phone] 1:50 PM, 04/27/2023   Please contact the on call pager after 5 pm and on weekends at [phone].

## 2023-04-27 NOTE — Progress Notes (Signed)
MD team at the bedside assessing patient.

## 2023-04-27 NOTE — Progress Notes (Signed)
Delay due to pt care, no iv access for bubble study.

## 2023-04-27 NOTE — Progress Notes (Signed)
Patient still without IV access - MD aware.

## 2023-04-27 NOTE — TOC Initial Note (Addendum)
Transition of Care [facility]) - Initial/Assessment Note    Patient Details  Name: Kathy Frank MRN: [id number] Date of Birth: Jan 21, 1976  [facility] CM/SW Contact:    Kermit Balo, RN Phone Number: 04/27/2023, 2:53 PM  Clinical Narrative:                 Patient is from home with her 2 daughters. She says they assist with her care at home.  She is active with [facility] for therapy. She wants to continue with their services.  She is asking for a manual wheelchair at home. CM has sent the order to [employer]. If approved the DME will be delivered to her hospital room.  Pt uses [employer] transportation to HD. Her daughters provide other needed transportation. They will transport her home when discharged.  TOC following.  Expected Discharge Plan: Home w Home Health Services Barriers to Discharge: Continued Medical Work up   Patient Goals and CMS Choice   CMS Medicare.gov Compare Post Acute Care list provided to:: Patient Choice offered to / list presented to : Patient      Expected Discharge Plan and Services   Discharge Planning Services: CM Consult Post Acute Care Choice: Home Health, Durable Medical Equipment Living arrangements for the past 2 months: Apartment                 DME Arranged: Community education officer wheelchair with seat cushion DME Agency: [employer] Date DME Agency Contacted: 04/27/23   Representative spoke with at DME Agency: [name] HH Arranged: PT HH Agency: [facility] Date [facility] Agency Contacted: 04/27/23   Representative spoke with at [facility] Agency: Haywood Lasso  Prior Living Arrangements/Services Living arrangements for the past 2 months: Apartment Lives with:: Adult Children Patient language and need for interpreter reviewed:: Yes Do you feel safe going back to the place where you live?: Yes        Care giver support system in place?: Yes (comment) Current home services: DME (transfer board/ BSC/ shower seat) Criminal  Activity/Legal Involvement Pertinent to Current Situation/Hospitalization: No - Comment as needed  Activities of Daily Living      Permission Sought/Granted                  Emotional Assessment Appearance:: Appears stated age Attitude/Demeanor/Rapport: Engaged Affect (typically observed): Accepting Orientation: : Oriented to Self, Oriented to Place, Oriented to  Time, Oriented to Situation   Psych Involvement: No (comment)  Admission diagnosis:  Hyperkalemia [E87.5] Stroke-like symptoms [R29.90] Patient Active Problem List   Diagnosis Date Noted   Stroke-like symptoms 04/26/2023   Panic attack 03/02/2023   S/P AKA (above knee amputation), right (HCC) 02/20/2023   Diabetes mellitus (HCC) 02/20/2023   S/P arteriovenous (AV) graft placement 02/16/2023   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) 02/15/2023   Colitis 02/04/2023   A-V fistula (HCC) 12/23/2022   Dependence on wheelchair 11/13/2022   Blister 10/29/2022   High anion gap metabolic acidosis 10/13/2022   Acute gastric ulcer with hemorrhage 10/13/2022   Right-sided chest pain 10/01/2022   Homelessness 08/26/2022   Migraine with aura 06/29/2022   Requires assistance with activities of daily living (ADL) 10/27/2021   Urinary and fecal incontinence 05/30/2021   Status post coronary artery stent placement    Coronary artery disease involving native coronary artery of native heart with unstable angina pectoris (HCC)    History of non-ST elevation myocardial infarction (NSTEMI) 05/20/2021   BKA stump complication (  HCC) 04/18/2021   Right below-knee amputee (HCC) 02/08/2021   Peripheral arterial disease (HCC) 01/29/2021   Anxiety 05/28/2020   Patient's other noncompliance with medication regimen 12/28/2019   Major depressive disorder, single episode, unspecified 04/24/2019   Secondary hyperparathyroidism of renal origin (HCC) 04/24/2019   S/P AKA (above knee amputation), left (HCC) 12/16/2018    Anemia due to chronic kidney disease    Osteomyelitis (HCC) 10/07/2018   Moderate protein-calorie malnutrition (HCC)    Adjustment disorder with mixed anxiety and depressed mood 08/15/2018   ESRD on dialysis (HCC)    Constipation due to pain medication 12/03/2016   Aortic atherosclerosis (HCC) 09/30/2016   Human immunodeficiency virus (HIV) disease (HCC) 04/23/2016   Moderate nonproliferative diabetic retinopathy of both eyes (HCC) 11/21/2014   Esophageal reflux    Essential hypertension 11/16/2013   Anemia 11/15/2013   Depression 06/28/2006   Diabetes mellitus type 2 in obese 06/28/1994   PCP:  Jeral Pinch, DO Pharmacy:   [id number] - [place] - [address] [place] [address] Phone: [phone] Fax: [phone]  [place] [facility] [address] [place] [address] Phone: [phone] Fax: [phone]     Social Determinants of Health (SDOH) Social History: SDOH Screenings   Food Insecurity: No Food Insecurity (04/27/2023)  Housing: Low Risk  (04/27/2023)  Transportation Needs: No Transportation Needs (04/27/2023)  Utilities: Not At Risk (04/27/2023)  Alcohol Screen: Low Risk  (10/19/2022)  Depression (PHQ2-9): Low Risk  (03/02/2023)  Financial Resource Strain: Low Risk  (10/19/2022)  Physical Activity: Inactive (10/19/2022)  Social Connections: Socially Isolated (10/19/2022)  Stress: Stress Concern Present (10/19/2022)  Tobacco Use: Medium Risk (04/27/2023)   SDOH Interventions:     Readmission Risk Interventions    12/24/2022   11:30 AM 05/23/2021    2:13 PM 04/22/2021    3:05 PM  Readmission Risk Prevention Plan  Transportation Screening Complete Complete Complete  Medication Review Oceanographer) Complete Complete Complete  PCP or Specialist appointment within 3-5 days of discharge Complete Complete Complete  HRI or Home Care Consult Complete Complete  Complete  SW Recovery Care/Counseling Consult Complete Complete Complete  Palliative Care Screening Not Applicable Not Applicable Not Applicable  Skilled Nursing Facility Not Applicable Not Applicable Not Applicable

## 2023-04-28 DIAGNOSIS — G9340 Encephalopathy, unspecified: Secondary | ICD-10-CM | POA: Diagnosis not present

## 2023-04-28 DIAGNOSIS — R569 Unspecified convulsions: Secondary | ICD-10-CM | POA: Diagnosis not present

## 2023-04-28 DIAGNOSIS — R392 Extrarenal uremia: Secondary | ICD-10-CM | POA: Diagnosis not present

## 2023-04-28 DIAGNOSIS — R29818 Other symptoms and signs involving the nervous system: Secondary | ICD-10-CM | POA: Diagnosis not present

## 2023-04-28 LAB — RENAL FUNCTION PANEL
Albumin: 3.3 g/dL — ABNORMAL LOW (ref 3.5–5.0)
Anion gap: 18 — ABNORMAL HIGH (ref 5–15)
BUN: 56 mg/dL — ABNORMAL HIGH (ref 6–20)
CO2: 23 mmol/L (ref 22–32)
Calcium: 8.7 mg/dL — ABNORMAL LOW (ref 8.9–10.3)
Chloride: 93 mmol/L — ABNORMAL LOW (ref 98–111)
Creatinine, Ser: 6.63 mg/dL — ABNORMAL HIGH (ref 0.44–1.00)
GFR, Estimated: 7 mL/min — ABNORMAL LOW (ref >60)
Glucose, Bld: 192 mg/dL — ABNORMAL HIGH (ref 70–99)
Phosphorus: 7.7 mg/dL — ABNORMAL HIGH (ref 2.5–4.6)
Potassium: 4 mmol/L (ref 3.5–5.1)
Sodium: 134 mmol/L — ABNORMAL LOW (ref 135–145)

## 2023-04-28 LAB — GLUCOSE, CAPILLARY
Glucose-Capillary: 173 mg/dL — ABNORMAL HIGH (ref 70–99)
Glucose-Capillary: 146 mg/dL — ABNORMAL HIGH (ref 70–99)
Glucose-Capillary: 135 mg/dL — ABNORMAL HIGH (ref 70–99)
Glucose-Capillary: 165 mg/dL — ABNORMAL HIGH (ref 70–99)

## 2023-04-28 MED ORDER — METHOCARBAMOL 500 MG PO TABS: 500.0000 mg | ORAL_TABLET | Freq: Four times a day (QID) | ORAL | Status: DC | PRN

## 2023-04-28 MED ORDER — CYCLOBENZAPRINE HCL 10 MG PO TABS
5.0000 mg | ORAL_TABLET | Freq: Two times a day (BID) | ORAL | Status: DC | PRN
  Filled 2023-04-28: qty 1

## 2023-04-28 MED ORDER — MUPIROCIN 2 % EX OINT
TOPICAL_OINTMENT | Freq: Three times a day (TID) | CUTANEOUS | Status: DC
  Filled 2023-04-28: qty 22

## 2023-04-28 MED ORDER — ACETAMINOPHEN-CAFFEINE 500-65 MG PO TABS
1.0000 | ORAL_TABLET | Freq: Once | ORAL | Status: DC
  Filled 2023-04-28: qty 1

## 2023-04-28 MED ORDER — GERHARDT'S BUTT CREAM
TOPICAL_CREAM | Freq: Two times a day (BID) | CUTANEOUS | Status: DC
  Filled 2023-04-28: qty 1

## 2023-04-28 MED ORDER — PSYLLIUM 95 % PO PACK
1.0000 | PACK | Freq: Every day | ORAL | Status: DC
  Administered 2023-04-28: 1 via ORAL
  Filled 2023-04-28 (×2): qty 1

## 2023-04-28 MED ORDER — METHOCARBAMOL 500 MG PO TABS
500.0000 mg | ORAL_TABLET | Freq: Once | ORAL | Status: AC
  Administered 2023-04-28: 500 mg via ORAL
  Filled 2023-04-28: qty 1

## 2023-04-28 MED ORDER — HYDROMORPHONE HCL 2 MG PO TABS
1.0000 mg | ORAL_TABLET | Freq: Once | ORAL | Status: AC
  Administered 2023-04-28: 1 mg via ORAL
  Filled 2023-04-28: qty 1

## 2023-04-28 NOTE — Progress Notes (Addendum)
HD#0 SUBJECTIVE:   Feeling better today. Had a scab on her R leg and it was bleeding.   OBJECTIVE:  Vital Signs: Vitals:   04/27/23 2343 04/28/23 0353 04/28/23 0726 04/28/23 1100  BP: (!) 177/93 (!) 183/89 (!) 189/89 (!) 170/88  Pulse: 87 87 88 86  Resp:   17 17  Temp: (!) 97.5 F (36.4 C) 98.3 F (36.8 C) 98.2 F (36.8 C) 98 F (36.7 C)  TempSrc: Oral Oral Oral Oral  SpO2: 90% 100% 100% 99%  Weight:       Supplemental O2: Room Air SpO2: 99 %  Filed Weights   04/26/23 1928  Weight: 74 kg     Intake/Output Summary (Last 24 hours) at 04/28/2023 1510 Last data filed at 04/28/2023 0500 Gross per 24 hour  Intake 480 ml  Output --  Net 480 ml   Net IO Since Admission: -2,860 mL [04/28/23 1510]  Physical Exam: Physical Exam Constitutional:      General: She is not in acute distress.    Appearance: She is not ill-appearing.  Cardiovascular:     Rate and Rhythm: Normal rate and regular rhythm.     Heart sounds: No murmur heard.    No friction rub. No gallop.  Pulmonary:     Effort: No respiratory distress.     Breath sounds: No wheezing, rhonchi or rales.  Musculoskeletal:     Right lower leg: No edema.     Left lower leg: No edema.  Skin:    General: Skin is warm and dry.     Capillary Refill: Capillary refill takes less than 2 seconds.     Comments: BKA, stump on the right had a clot. Not oozing, erythema or warm to palpation.  Neurological:     Mental Status: She is alert. She is confused.     Cranial Nerves: Cranial nerves 2-12 are intact. Dysarthria: 5.     Motor: No weakness or abnormal muscle tone.     Comments: Oriented to self and time but not to place. Pleasantly sitting on the bed.  5/5 on bilateral upper extremities.  5/5 strength BLE      Patient Lines/Drains/Airways Status     Active Line/Drains/Airways     Name Placement date Placement time Site Days   Fistula / Graft Left Forearm Arteriovenous fistula 04/14/19  1642  Forearm  1474    Fistula / Graft Left Upper arm --  --  Upper arm  --   Fistula / Graft Left Upper arm --  --  Upper arm  --   Hemodialysis Catheter Right Subclavian Double lumen Permanent (Tunneled) 12/18/22  1530  Subclavian  130   Wound / Incision (Open or Dehisced) 02/15/23 Non-pressure wound Leg Proximal;Right 02/15/23  1538  Leg  71             ASSESSMENT/PLAN:  Assessment: Principal Problem:   Stroke-like symptoms  Kathy Frank is an 47 y.o. female CAD, CVA, PAD, Depression, DMT2, ESRD, HIV, and erosive esophagitis who presented with Seizures most likely due to Uremia.   Plan:  Encephalopathy - resolved with HD  Uremia Has no residual deficits today is alert and oriented. Has been having trouble making it to her HD sessions because transportation would be sometimes be late picking her up.  -HD tomorrow -Cw amlodipine 10mg  daily and ibesartan 300mg  daily  -Restart phoslo and vdra  -Renal function panel tomorrow  -CBC tomorrow  -TOC consult to assist with transportation barriers to  get to Dialysis  Possible Aneurysm left ICA on CT -CTA ordered has not been completed because the patient does not have IV access.  -[place] of carotids ordered.   ESRD, HD T/TH/Sat Anemia of Chronic Disease Plan as above, HD on 9/19.  Bilateral Above Knee Amputation  Most recent one of the right 2/2 gangrene due to severe PVD, BKA to AKA 2/2 osteomyelitis 03/2023 Small eschar on exam today. No signs of active infection.  -Mupirocin ointment and bandage. -Will continue to monitor for signs of infection.   HIV  Continue with Biktarvy   T2DM  Cw SSI   HTN  cw Amlodipine 10mg  daily  cw Ibesartan 300mg  daily  HD tomorrow  Hx of Migraine HA  At home uses Ubrelvy 50mg  PRN. Excedrin PRN.  Hx of Anxiety  Buspar 2.5mg  BID  Lexapro 5mg  daily   History of CVA  Cw atorvastatin 80mg  daily and ASA 81mg  daily  Chronic Diarrhea Monitor  Signature: [facility]  Internal Medicine  Resident, PGY-1 [facility]  Pager: [phone] 3:10 PM, 04/28/2023   Please contact the on call pager after 5 pm and on weekends at [phone].

## 2023-04-28 NOTE — Plan of Care (Signed)

## 2023-04-28 NOTE — Plan of Care (Signed)

## 2023-04-28 NOTE — Progress Notes (Signed)
[facility] Progress Note   Subjective:  Seen in room. Alert, back to baseline MS. No complaints today. Says she is going home  Objective Vitals:   04/27/23 2343 04/28/23 0353 04/28/23 0726 04/28/23 1100  BP: (!) 177/93 (!) 183/89 (!) 189/89 (!) 170/88  Pulse: 87 87 88 86  Resp:   17 17  Temp: (!) 97.5 F (36.4 C) 98.3 F (36.8 C) 98.2 F (36.8 C) 98 F (36.7 C)  TempSrc: Oral Oral Oral Oral  SpO2: 90% 100% 100% 99%  Weight:         Additional Objective Labs: Basic Metabolic Panel: Recent Labs  Lab 04/26/23 1926 04/26/23 1929 04/27/23 0649 04/28/23 0834  NA 139 139 135 134*  K 7.0* 6.9* 3.6 4.0  CL 102 114* 94* 93*  CO2 13*  --  26 23  GLUCOSE 107* 104* 73 192*  BUN 81* 74* 26* 56*  CREATININE 8.51* 9.90* 4.18* 6.63*  CALCIUM 8.2*  --  8.7* 8.7*  PHOS  --   --  4.3 7.7*   CBC: Recent Labs  Lab 04/26/23 1926 04/26/23 1929 04/27/23 0649 04/28/23 0834  WBC 6.0  --  4.2 2.6*  NEUTROABS 3.9  --   --   --   HGB 11.2* 13.3 11.6* 12.8  HCT 37.6 39.0 36.6 40.8  MCV 92.8  --  88.4 89.9  PLT 244  --  250 253   Blood Culture    Component Value Date/Time   SDES BLOOD RIGHT HAND 02/15/2023 1814   SPECREQUEST  02/15/2023 1814    BOTTLES DRAWN AEROBIC AND ANAEROBIC Blood Culture adequate volume   CULT  02/15/2023 1814    NO GROWTH 5 DAYS Performed at [facility], [address] [place], [place] [address]    REPTSTATUS 02/20/2023 FINAL 02/15/2023 1814     Physical Exam General: Alert, nad Heart: Regular rate Lungs: Normal wob  Abdomen: soft  Extremities: b/l AKAs Dialysis Access: LUE AVF +bruit   Medications:    acetaminophen-caffeine  1 tablet Oral Once   albuterol  2.5 mg Nebulization Once   amLODipine  10 mg Oral Daily   aspirin EC  81 mg Oral Daily   atorvastatin  80 mg Oral Daily   bictegravir-emtricitabine-tenofovir AF  1 tablet Oral Daily   busPIRone  2.5 mg Oral BID   calcium acetate  2,001 mg Oral TID WC    calcium gluconate  1 g Intravenous Once   Chlorhexidine Gluconate Cloth  6 each Topical Tomorrow-1000   clopidogrel  75 mg Oral Daily   insulin aspart  5 Units Intravenous Once   And   dextrose  1 ampule Intravenous Once   escitalopram  5 mg Oral Daily   heparin  5,000 Units Subcutaneous Q8H   insulin aspart  0-6 Units Subcutaneous TID WC   irbesartan  300 mg Oral Daily   sodium chloride flush  3 mL Intravenous Once    Dialysis Orders: TTS [facility]  4h  400/1.5   66.9kg   2K/3Ca bath  RIJ TDC / revised AVF LUA Heparin none - started 7/06 w/ one needle in AVF and other line in the catheter - mircera 150 mcg IV q 2 wks, last on 04/08/23 - Calcitriol 0.75 mcg po three times per week   Assessment/Plan: Stroke like symptoms/likely provoked seizure. - seen by neurology. Brain MRI negative for CVA. CTA ordered Per primary team.  PAD s/p R BKA stump infection earlier in the summer (  7/12) -> AKA now with b/l AKA's.  ESRD - on HD TTS.  Next HD 9/19 HTN -home meds resumed. On amlodipine, irbesartan  Volume - UF to dry weight as able Anemia esrd - Hgb 13.3. No Aranesp  needed (last  given 04/08/23)  MBD ckd - CCa and phos in range. Cont po vdra and phoslo as binder.  HIV -Meds Per admit med team DM2 - per pmd Chronic diarrhea   Kathy Blase PA-C [facility] 04/28/2023,11:34 AM

## 2023-04-28 NOTE — Progress Notes (Incomplete)
STROKE TEAM PROGRESS NOTE   BRIEF HPI Ms. AZJAH ZIOLKOWSKI is a 47 y.o. female with history of CAD s/p stent placement, anemia, CAP in Feb of this year, stroke, depression, DM2, ESRD on HD, fecal incontinence, HIV, HLD, HTN, prior episodes of hypertensive emergency/urgency, diabetic retinopathy, PAD and bilateral AKA who presents to the ED via EMS as a Code Stroke for acute onset of left facial droop, slurred speech, left sided weakness and left hemineglect.   SIGNIFICANT HOSPITAL EVENTS 9/16: Admitted to the hospital, hypertensive to 235/100, hypokalemic to 7.0 9/17: Finish hemodialysis and early morning, altered mental status resolved, normokalemic, no focal deficits noted. Patient returned to baseline. EEG negative. MRI negative.   INTERIM HISTORY/SUBJECTIVE  ***   OBJECTIVE  CBC    Component Value Date/Time   WBC 4.2 04/27/2023 0649   RBC 4.14 04/27/2023 0649   HGB 11.6 (L) 04/27/2023 0649   HGB 9.2 (L) 10/19/2022 1204   HCT 36.6 04/27/2023 0649   HCT 28.6 (L) 10/19/2022 1204   PLT 250 04/27/2023 0649   PLT 343 10/19/2022 1204   MCV 88.4 04/27/2023 0649   MCV 91 10/19/2022 1204   MCH 28.0 04/27/2023 0649   MCHC 31.7 04/27/2023 0649   RDW 17.0 (H) 04/27/2023 0649   RDW 16.0 (H) 10/19/2022 1204   LYMPHSABS 1.4 04/26/2023 1926   LYMPHSABS 1.6 12/23/2017 1558   MONOABS 0.4 04/26/2023 1926   EOSABS 0.3 04/26/2023 1926   EOSABS 0.2 12/23/2017 1558   BASOSABS 0.1 04/26/2023 1926   BASOSABS 0.1 12/23/2017 1558    BMET    Component Value Date/Time   NA 135 04/27/2023 0649   NA 138 12/23/2017 1558   K 3.6 04/27/2023 0649   CL 94 (L) 04/27/2023 0649   CO2 26 04/27/2023 0649   GLUCOSE 73 04/27/2023 0649   BUN 26 (H) 04/27/2023 0649   BUN 28 (H) 12/23/2017 1558   CREATININE 4.18 (H) 04/27/2023 0649   CREATININE 12.07 (H) 05/29/2020 1155   CALCIUM 8.7 (L) 04/27/2023 0649   CALCIUM 8.5 (L) 11/25/2018 0309   GFRNONAA 13 (L) 04/27/2023 0649   GFRNONAA 79 11/14/2014 1154     IMAGING past 24 hours ECHOCARDIOGRAM COMPLETE BUBBLE STUDY  Result Date: 04/27/2023    ECHOCARDIOGRAM REPORT   Patient Name:   FAIR BRAHMBHATT Date of Exam: 04/27/2023 Medical Rec #:  [id number]       Height:       60.0 in Accession #:    [id number]      Weight:       163.1 lb Date of Birth:  08-01-1976        BSA:          1.712 m Patient Age:    47 years        BP:           208/86 mmHg Patient Gender: F               HR:           91 bpm. Exam Location:  Inpatient Procedure: 2D Echo, Color Doppler and Cardiac Doppler Indications:    Stroke-like symptoms [725552]  History:        Patient has prior history of Echocardiogram examinations, most                 recent 05/21/2021. CAD, PAD; Risk Factors:Diabetes, Former                 Smoker, Hypertension  and Dyslipidemia.  Sonographer:    Aron Baba Referring Phys: [id number] JULIE MACHEN  Sonographer Comments: Image acquisition challenging due to uncooperative patient. IMPRESSIONS  1. Left ventricular ejection fraction, by estimation, is 60 to 65%. The left ventricle has normal function. The left ventricle has no regional wall motion abnormalities. There is severe left ventricular hypertrophy. Left ventricular diastolic parameters  are consistent with Grade II diastolic dysfunction (pseudonormalization). Elevated left atrial pressure.  2. Right ventricular systolic function is normal. The right ventricular size is normal. Tricuspid regurgitation signal is inadequate for assessing PA pressure.  3. Left atrial size was moderately dilated.  4. The mitral valve is degenerative. Trivial mitral valve regurgitation. Mild mitral stenosis. The mean mitral valve gradient is 5.0 mmHg with average heart rate of 91 bpm. Severe mitral annular calcification.  5. The aortic valve is tricuspid. Aortic valve regurgitation is not visualized. Aortic valve sclerosis/calcification is present, without any evidence of aortic stenosis.  6. The inferior vena cava is normal in size with  greater than 50% respiratory variability, suggesting right atrial pressure of 3 mmHg. FINDINGS  Left Ventricle: Left ventricular ejection fraction, by estimation, is 60 to 65%. The left ventricle has normal function. The left ventricle has no regional wall motion abnormalities. The left ventricular internal cavity size was normal in size. There is  severe left ventricular hypertrophy. Left ventricular diastolic parameters are consistent with Grade II diastolic dysfunction (pseudonormalization). Elevated left atrial pressure. Right Ventricle: The right ventricular size is normal. No increase in right ventricular wall thickness. Right ventricular systolic function is normal. Tricuspid regurgitation signal is inadequate for assessing PA pressure. Left Atrium: Left atrial size was moderately dilated. Right Atrium: Right atrial size was normal in size. Pericardium: There is no evidence of pericardial effusion. Mitral Valve: The mitral valve is degenerative in appearance. Severe mitral annular calcification. Trivial mitral valve regurgitation. Mild mitral valve stenosis. The mean mitral valve gradient is 5.0 mmHg with average heart rate of 91 bpm. Tricuspid Valve: The tricuspid valve is normal in structure. Tricuspid valve regurgitation is trivial. Aortic Valve: The aortic valve is tricuspid. Aortic valve regurgitation is not visualized. Aortic valve sclerosis/calcification is present, without any evidence of aortic stenosis. Pulmonic Valve: The pulmonic valve was not well visualized. Pulmonic valve regurgitation is not visualized. Aorta: The aortic root and ascending aorta are structurally normal, with no evidence of dilitation. Venous: The inferior vena cava is normal in size with greater than 50% respiratory variability, suggesting right atrial pressure of 3 mmHg. IAS/Shunts: The interatrial septum was not well visualized.  LEFT VENTRICLE PLAX 2D LVIDd:         4.60 cm   Diastology LVIDs:         3.40 cm   LV e'  medial:    4.57 cm/s LV PW:         1.50 cm   LV E/e' medial:  24.5 LV IVS:        1.20 cm   LV e' lateral:   5.00 cm/s LVOT diam:     1.80 cm   LV E/e' lateral: 22.4 LV SV:         60 LV SV Index:   35 LVOT Area:     2.54 cm  RIGHT VENTRICLE RV S prime:     10.90 cm/s TAPSE (M-mode): 2.0 cm LEFT ATRIUM           Index        RIGHT ATRIUM  Index LA diam:      4.10 cm 2.40 cm/m   RA Area:     14.50 cm LA Vol (A4C): 79.4 ml 46.38 ml/m  RA Volume:   37.40 ml  21.85 ml/m  AORTIC VALVE LVOT Vmax:   146.00 cm/s LVOT Vmean:  90.700 cm/s LVOT VTI:    0.236 m  AORTA Ao Root diam: 3.10 cm Ao Asc diam:  3.30 cm MITRAL VALVE MV Area (PHT): 4.21 cm     SHUNTS MV Mean grad:  5.0 mmHg     Systemic VTI:  0.24 m MV Decel Time: 180 msec     Systemic Diam: 1.80 cm MV E velocity: 112.00 cm/s MV A velocity: 149.00 cm/s MV E/A ratio:  0.75 Epifanio Lesches MD Electronically signed by Epifanio Lesches MD Signature Date/Time: 04/27/2023/4:51:01 PM    Final    EEG adult  Result Date: 04/27/2023 Charlsie Quest, MD     04/27/2023 11:42 AM Patient Name: LESTA CARTA MRN: [id number] Epilepsy Attending: Charlsie Quest Referring Physician/Provider: Marvel Plan, MD Date: 04/27/2023 Duration: 22.20 mins Patient history: 47yo F with ams getting eeg to evaluate for seizure Level of alertness: Awake AEDs during EEG study: None Technical aspects: This EEG study was done with scalp electrodes positioned according to the 10-20 International system of electrode placement. Electrical activity was reviewed with band pass filter of 1-70Hz , sensitivity of 7 uV/mm, display speed of 54mm/sec with a 60Hz  notched filter applied as appropriate. EEG data were recorded continuously and digitally stored.  Video monitoring was available and reviewed as appropriate. Description: EEG showed continuous generalized polymorphic 3 to 6 Hz theta-delta slowing. Hyperventilation and photic stimulation were not performed.   ABNORMALITY -  Continuous slow, generalized IMPRESSION: This study is suggestive of moderate diffuse encephalopathy. No seizures or epileptiform discharges were seen throughout the recording. Priyanka Annabelle Harman    Vitals:   04/27/23 2022 04/27/23 2343 04/28/23 0353 04/28/23 0726  BP: (!) 173/78 (!) 177/93 (!) 183/89 (!) 189/89  Pulse: 93 87 87 88  Resp: 18   17  Temp: 99.1 F (37.3 C) (!) 97.5 F (36.4 C) 98.3 F (36.8 C) 98.2 F (36.8 C)  TempSrc: Oral Oral Oral Oral  SpO2: 100% 90% 100% 100%  Weight:         PHYSICAL EXAM General: Alert, no acute distress, bilateral AKA sitting up in bed Psych:  Mood and affect appropriate for situation, somewhat tangential CV: Regular rate and rhythm on monitor Respiratory:  Regular, unlabored respirations on room air GI: Abdomen soft and nontender   NEURO:  Mental Status: AA&Ox3, patient is able to give clear and coherent history Speech/Language: speech is without dysarthria or aphasia.  Naming, repetition, fluency, and comprehension intact.  Cranial Nerves:  II: PERRL. Visual fields full.  III, IV, VI: EOMI. Eyelids elevate symmetrically.  V: Sensation is intact to light touch and symmetrical to face.  VII: Face is symmetrical resting and smiling VIII: hearing intact to voice. IX, X: Palate elevates symmetrically. Phonation is normal.  WU:JWJXBJYN shrug 5/5. XII: tongue is midline without fasciculations. Motor: 5/5 strength to right upper and right lower limb tone, moves upper thighs (ISO bilateral AKA) Sensation: Intact to light touch bilaterally. Extinction absent to light touch to DSS.   Coordination: FTN intact bilaterally  Overall: Nonfocal.   ASSESSMENT/PLAN Patient confirmed that she has not underwent complete dialysis sessions recently, as they are cut short when the outpatient center closes. Imaging negative for acute process.  Left  sided focal findings have resolved completely.  History is consistent with a seizure (shaking, loss of  consciousness, postictal state.) Believe that seizure was likely precipitated by uremic encephalopathy (BUN 81 on admission).  Dialysis overnight 9/16.  Do not feel the need to add AEDs to her regimen at this time, as etiology of seizure provocation appears clear.  Would be beneficial to reach out to outpatient dialysis center, to ensure that patient gets full courses of treatment.  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  Likely provoked seizure in the setting of uremia, electrolyte derangements and hypertensive urgency, although TIA still in DDx Code Stroke CT head: No acute abnormality.  Old cortical infarct in right occipital lobe.  Old perforator infarcts in right lentiform nucleus/corona radiata as well as thalami.     MRI  No acute intracranial process. No evidence of acute or subacute infarct. MRA  No intracranial large vessel occlusion or significant stenosis. 2D Echo EF 60-65% EEG moderate diffuse encephalopathy. No seizures or epileptiform discharges  LDL 113 HgbA1c 6.1 in 02/2023 VTE prophylaxis -subcu heparin No  aspirin 81 mg daily prior to admission, now on aspirin 81 mg daily and clopidogrel 75 mg daily for 3 weeks and then ASA alone. No AED needed now given likely provoked seizure Therapy recommendations: HH PT and OT Disposition: TBD  Uremia  Hyperkalemia  ESRD on HD T/TH/SAT Cre 9.9 -> HD ->4.18 Potassium 6.9-> HD ->3.6 Received hemodialysis early a.m. of 9/17. Recent incomplete HD sessions due to late arrivals Nephrology on board  ? 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 This could be further evaluated with a CTA. CTA head and neck  pending  Hypertensive urgency Home meds: Azor 10-40 mg, Resumed amlodipine 10 mg daily, irbesartan 300 mg daily  BP still high, 180s-230s    Primary team managing Gradually normalize BP in 2-3 days Long term goal normotensive  Diabetes type II Controlled Home meds: Insulin glargine 15 units nightly HgbA1c 6.1 in 02/2023, goal < 7.0 CBGs SSI Recommend close follow-up with PCP for better DM control  Hyperlipidemia  Home meds lipitor 80 LDL 113, goal < 70 Continue home lipitor 80 Medication compliance education provided Continue statin on discharge.   Other Stroke Risk Factors Obesity, Body mass index is 31.86 kg/m., BMI >/= 30 associated with increased stroke risk, recommend weight loss, diet and exercise as appropriate  Coronary artery disease s/p stenting Migraine - on Ubrelvy for acute management HIV on HARRT PAD with b/l AKA Hx of stroke - CT showed multifocal chronic lacunar infarcts Former smoker  Other Active Problems   Hospital day # 0  Luiz Iron, MD Psychiatry Resident, PGY-1 To contact Stroke Continuity provider, please refer to [email or web address]. After hours, contact General Neurology

## 2023-04-28 NOTE — Progress Notes (Signed)
STROKE TEAM PROGRESS NOTE   BRIEF HPI [name] is a 47 y.o. female with history of CAD s/p stent placement, anemia, CAP in Feb of this year, stroke, depression, DM2, ESRD on HD, fecal incontinence, HIV, HLD, HTN, prior episodes of hypertensive emergency/urgency, diabetic retinopathy, PAD and bilateral AKA who presents to the ED via EMS as a Code Stroke for acute onset of left facial droop, slurred speech, left sided weakness and left hemineglect.   SIGNIFICANT HOSPITAL EVENTS 9/16: Admitted to the hospital, hypertensive to 235/100, hypokalemic to 7.0 9/17: Finish hemodialysis and early morning, altered mental status resolved, normokalemic, no focal deficits noted  INTERIM HISTORY/SUBJECTIVE No family at the bedside. Pt sitting in bed for lunch, no acute event overnight. Pt neuro stable. Not able to have CTA head and neck due to no IV access. Will do CUS instead but not sure if the extracranial ICA region of suspicion can be accessed.    OBJECTIVE  CBC    Component Value Date/Time   WBC 2.6 (L) 04/28/2023 0834   RBC 4.54 04/28/2023 0834   HGB 12.8 04/28/2023 0834   HGB 9.2 (L) 10/19/2022 1204   HCT 40.8 04/28/2023 0834   HCT 28.6 (L) 10/19/2022 1204   PLT 253 04/28/2023 0834   PLT 343 10/19/2022 1204   MCV 89.9 04/28/2023 0834   MCV 91 10/19/2022 1204   MCH 28.2 04/28/2023 0834   MCHC 31.4 04/28/2023 0834   RDW 16.8 (H) 04/28/2023 0834   RDW 16.0 (H) 10/19/2022 1204   LYMPHSABS 1.4 04/26/2023 1926   LYMPHSABS 1.6 12/23/2017 1558   MONOABS 0.4 04/26/2023 1926   EOSABS 0.3 04/26/2023 1926   EOSABS 0.2 12/23/2017 1558   BASOSABS 0.1 04/26/2023 1926   BASOSABS 0.1 12/23/2017 1558    BMET    Component Value Date/Time   NA 134 (L) 04/28/2023 0834   NA 138 12/23/2017 1558   K 4.0 04/28/2023 0834   CL 93 (L) 04/28/2023 0834   CO2 23 04/28/2023 0834   GLUCOSE 192 (H) 04/28/2023 0834   BUN 56 (H) 04/28/2023 0834   BUN 28 (H) 12/23/2017 1558   CREATININE 6.63 (H)  04/28/2023 0834   CREATININE 12.07 (H) 05/29/2020 1155   CALCIUM 8.7 (L) 04/28/2023 0834   CALCIUM 8.5 (L) 11/25/2018 0309   GFRNONAA 7 (L) 04/28/2023 0834   GFRNONAA 79 11/14/2014 1154    IMAGING past 24 hours No results found.  Vitals:   04/28/23 0726 04/28/23 1100 04/28/23 1532 04/28/23 1938  BP: (!) 189/89 (!) 170/88 (!) 182/73 (!) 180/73  Pulse: 88 86 85 87  Resp: 17 17 16 18   Temp: 98.2 F (36.8 C) 98 F (36.7 C) 98.1 F (36.7 C) 98.3 F (36.8 C)  TempSrc: Oral Oral Oral Oral  SpO2: 100% 99% 98% 98%  Weight:         PHYSICAL EXAM General: Alert, no acute distress, bilateral AKA sitting up in bed Psych:  Mood and affect appropriate for situation, somewhat tangential CV: Regular rate and rhythm on monitor Respiratory:  Regular, unlabored respirations on room air GI: Abdomen soft and nontender   NEURO:  Mental Status: AA&Ox3, patient is able to give clear and coherent history Speech/Language: speech is without dysarthria or aphasia.  Naming, repetition, fluency, and comprehension intact.  Cranial Nerves:  II: PERRL. Visual fields full.  III, IV, VI: EOMI. Eyelids elevate symmetrically.  V: Sensation is intact to light touch and symmetrical to face.  VII: Face is symmetrical  resting and smiling VIII: hearing intact to voice. IX, X: Palate elevates symmetrically. Phonation is normal.  WU:JWJXBJYN shrug 5/5. XII: tongue is midline without fasciculations. Motor: 5/5 strength to right upper and right lower limb tone, moves upper thighs (ISO bilateral AKA) Sensation: Intact to light touch bilaterally. Extinction absent to light touch to DSS.   Coordination: FTN intact bilaterally  Overall: Nonfocal.   ASSESSMENT/PLAN  Likely provoked seizure in the setting of uremia, electrolyte derangements and hypertensive urgency, although TIA still in DDx Code Stroke CT head: No acute abnormality.  Old cortical infarct in right occipital lobe.  Old perforator infarcts in  right lentiform nucleus/corona radiata as well as thalami.     MRI  No acute intracranial process. No evidence of acute or subacute infarct. MRA  No intracranial large vessel occlusion or significant stenosis. 2D Echo EF 60-65% EEG moderate diffuse encephalopathy. No seizures or epileptiform discharges  LDL 113 HgbA1c 6.1 in 02/2023 VTE prophylaxis -subcu heparin No  aspirin 81 mg daily prior to admission, now on aspirin 81 mg daily and clopidogrel 75 mg daily for 3 weeks and then ASA alone. No AED needed now given likely provoked seizure Therapy recommendations: HH PT and OT Disposition: TBD  Uremia  Hyperkalemia  ESRD on HD T/TH/SAT Cre 9.9 -> HD ->4.18->6.63 Potassium 6.9-> HD ->3.6->4.0 Received hemodialysis early a.m. of 9/17 Recent incomplete HD sessions due to late arrivals Nephrology on board  ? 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 This could be further evaluated with a CTA. CTA head and neck not able to perform due to no IV access Will do CUS but not sure if the extracranial ICA region of suspicion can be accessed.  Hypertensive urgency Home meds: Azor 10-40 mg, Resumed amlodipine 10 mg daily, irbesartan 300 mg daily  BP still high 180s  Primary team managing Gradually normalize BP in 2-3 days Long term goal normotensive  Diabetes type II Controlled Home meds: Insulin glargine 15 units nightly HgbA1c 6.1 in 02/2023, goal < 7.0 CBGs SSI Recommend close follow-up with PCP for better DM control  Hyperlipidemia  Home meds lipitor 80 LDL 113, goal < 70 Continue home lipitor 80 Medication compliance education provided Continue statin on discharge   Other Stroke Risk Factors Obesity, Body mass index is 31.86 kg/m., BMI >/= 30 associated with increased stroke risk, recommend weight loss, diet and exercise as appropriate  Coronary artery disease s/p  stenting Migraine - on Ubrelvy for acute management HIV on HARRT PAD with b/l AKA Hx of stroke - CT showed multifocal chronic lacunar infarcts Former smoker  Other Active Problems   Hospital day # 0   Marvel Plan, MD PhD Stroke Neurology 04/28/2023 8:54 PM        To contact Stroke Continuity provider, please refer to [email or web address]. After hours, contact General Neurology

## 2023-04-28 NOTE — Progress Notes (Signed)
Occupational Therapy Treatment Patient Details Name: Kathy Frank MRN: [id number] DOB: Sep 19, 1975 Today's Date: 04/28/2023   History of present illness Pt is a 47 y/o female who presents to the ED via EMS as a Code Stroke for acute onset of left facial droop, slurred speech, left sided weakness and left hemineglect. MRI (-). 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   OT comments  Pt much improved today and most likely closer to baseline. W/c in room without cushion - CM made aware and vendor will either deliver it to the hospital or ship it to pts home. Pt independent with ant/post transfer. Educated pt on use of w/c including locking brakes fro transfers and use of quick release arms - pt is excited about being able to use her transfers board. Educated on HEP for L hand. Further OT can be addressed by HHOT. OT signing off.       If plan is discharge home, recommend the following:  A little help with walking and/or transfers;A little help with bathing/dressing/bathroom;Assistance with cooking/housework;Direct supervision/assist for medications management;Direct supervision/assist for financial management;Assist for transportation;Help with stairs or ramp for entrance   Equipment Recommendations  Wheelchair (measurements OT);Wheelchair cushion (measurements OT)    Recommendations for Other Services      Precautions / Restrictions Precautions Precautions: Fall       Mobility Bed Mobility Overal bed mobility: Independent                  Transfers      Indep with ant/post transfer to wc; pillow and bed pad placed as cushion was not in wc                   Balance                                            ADL either performed or assessed with clinical judgement   ADL                                         General ADL Comments: most likely close to baseline    Extremity/Trunk Assessment Upper Extremity Assessment Upper Extremity Assessment: Right hand dominant;LUE deficits/detail LUE Deficits / Details: Lue closer to baseline today. educated on fine motor/ Theraputty and foam pinch and grip strengthening            Vision   Additional Comments: vision impaired; Educated pt on recommendation of settin gup eye appointment   Perception Perception Perception-Other Comments: much improved today   Praxis      Cognition Arousal: Alert Behavior During Therapy: WFL for tasks assessed/performed Overall Cognitive Status: History of cognitive impairments - at baseline                                 General Comments: much improved today; most likely at baseline        Exercises Exercises: Other exercises Other Exercises Other Exercises: theraputty - grip and pinch strengthening Other  Exercises: foam strengthening    Shoulder Instructions       General Comments      Pertinent Vitals/ Pain          Home Living                                          Prior Functioning/Environment              Frequency  Min 1X/week        Progress Toward Goals  OT Goals(current goals can now be found in the care plan section)  Progress towards OT goals: Progressing toward goals  Acute Rehab OT Goals Patient Stated Goal: home today OT Goal Formulation: With patient Time For Goal Achievement: 05/11/23 Potential to Achieve Goals: Good ADL Goals Pt Will Perform Lower Body Dressing: with modified independence;bed level Pt Will Transfer to Toilet: with supervision;bedside commode;anterior/posterior transfer Pt/caregiver will Perform Home Exercise Program: Increased strength;With theraputty;Left upper extremity;With  Supervision  Plan      Co-evaluation                 AM-PAC OT "6 Clicks" Daily Activity     Outcome Measure   Help from another person eating meals?: None Help from another person taking care of personal grooming?: A Little Help from another person toileting, which includes using toliet, bedpan, or urinal?: A Little Help from another person bathing (including washing, rinsing, drying)?: A Little Help from another person to put on and taking off regular upper body clothing?: A Little Help from another person to put on and taking off regular lower body clothing?: A Little 6 Click Score: 19    End of Session    OT Visit Diagnosis: Other abnormalities of gait and mobility (R26.89);Muscle weakness (generalized) (M62.81);Low vision, both eyes (H54.2);Other symptoms and signs involving the nervous system (R29.898);Other symptoms and signs involving cognitive function;Hemiplegia and hemiparesis Hemiplegia - Right/Left: Left Hemiplegia - dominant/non-dominant: Non-Dominant Hemiplegia - caused by: Unspecified   Activity Tolerance Patient tolerated treatment well   Patient Left in bed;Other (comment) (eating)   Nurse Communication Mobility status;Other (comment) (need for wc cushion)        Time: 7829-5621 OT Time Calculation (min): 20 min  Charges: OT General Charges $OT Visit: 1 Visit OT Treatments $Self Care/Home Management : 8-22 mins  Luisa Dago, OT/L   Acute OT Clinical Specialist Acute Rehabilitation Services Pager [phone] Office [phone]   [facility] 04/28/2023, 11:38 AM

## 2023-04-29 ENCOUNTER — Encounter (HOSPITAL_COMMUNITY): Payer: Self-pay

## 2023-04-29 ENCOUNTER — Observation Stay (HOSPITAL_COMMUNITY): Payer: 59

## 2023-04-29 ENCOUNTER — Encounter (HOSPITAL_COMMUNITY): Payer: 59

## 2023-04-29 ENCOUNTER — Other Ambulatory Visit (HOSPITAL_COMMUNITY): Payer: Self-pay

## 2023-04-29 DIAGNOSIS — R4182 Altered mental status, unspecified: Secondary | ICD-10-CM | POA: Diagnosis not present

## 2023-04-29 DIAGNOSIS — R2990 Unspecified symptoms and signs involving the nervous system: Secondary | ICD-10-CM

## 2023-04-29 DIAGNOSIS — G9340 Encephalopathy, unspecified: Secondary | ICD-10-CM

## 2023-04-29 DIAGNOSIS — G9341 Metabolic encephalopathy: Secondary | ICD-10-CM

## 2023-04-29 DIAGNOSIS — R392 Extrarenal uremia: Secondary | ICD-10-CM | POA: Diagnosis not present

## 2023-04-29 DIAGNOSIS — R29818 Other symptoms and signs involving the nervous system: Secondary | ICD-10-CM | POA: Diagnosis not present

## 2023-04-29 LAB — CBC WITH DIFFERENTIAL/PLATELET
Abs Immature Granulocytes: 0 10*3/uL (ref 0.00–0.07)
Basophils Absolute: 0.1 10*3/uL (ref 0.0–0.1)
Basophils Relative: 2 %
Eosinophils Absolute: 0.3 10*3/uL (ref 0.0–0.5)
Eosinophils Relative: 9 %
HCT: 36.4 % (ref 36.0–46.0)
Hemoglobin: 11.5 g/dL — ABNORMAL LOW (ref 12.0–15.0)
Immature Granulocytes: 0 %
Lymphocytes Relative: 34 %
Lymphs Abs: 1.1 10*3/uL (ref 0.7–4.0)
MCH: 28.1 pg (ref 26.0–34.0)
MCHC: 31.6 g/dL (ref 30.0–36.0)
MCV: 89 fL (ref 80.0–100.0)
Monocytes Absolute: 0.4 10*3/uL (ref 0.1–1.0)
Monocytes Relative: 12 %
Neutro Abs: 1.4 10*3/uL — ABNORMAL LOW (ref 1.7–7.7)
Neutrophils Relative %: 43 %
Platelets: 246 10*3/uL (ref 150–400)
RBC: 4.09 MIL/uL (ref 3.87–5.11)
RDW: 16.5 % — ABNORMAL HIGH (ref 11.5–15.5)
WBC: 3.3 10*3/uL — ABNORMAL LOW (ref 4.0–10.5)
nRBC: 0 % (ref 0.0–0.2)

## 2023-04-29 LAB — RENAL FUNCTION PANEL
Albumin: 3.1 g/dL — ABNORMAL LOW (ref 3.5–5.0)
Anion gap: 23 — ABNORMAL HIGH (ref 5–15)
BUN: 83 mg/dL — ABNORMAL HIGH (ref 6–20)
CO2: 21 mmol/L — ABNORMAL LOW (ref 22–32)
Calcium: 8.7 mg/dL — ABNORMAL LOW (ref 8.9–10.3)
Chloride: 90 mmol/L — ABNORMAL LOW (ref 98–111)
Creatinine, Ser: 8.57 mg/dL — ABNORMAL HIGH (ref 0.44–1.00)
GFR, Estimated: 5 mL/min — ABNORMAL LOW (ref >60)
Glucose, Bld: 176 mg/dL — ABNORMAL HIGH (ref 70–99)
Phosphorus: 8.5 mg/dL — ABNORMAL HIGH (ref 2.5–4.6)
Potassium: 4.4 mmol/L (ref 3.5–5.1)
Sodium: 134 mmol/L — ABNORMAL LOW (ref 135–145)

## 2023-04-29 LAB — GLUCOSE, CAPILLARY
Glucose-Capillary: 124 mg/dL — ABNORMAL HIGH (ref 70–99)
Glucose-Capillary: 131 mg/dL — ABNORMAL HIGH (ref 70–99)
Glucose-Capillary: 164 mg/dL — ABNORMAL HIGH (ref 70–99)
Glucose-Capillary: 227 mg/dL — ABNORMAL HIGH (ref 70–99)

## 2023-04-29 MED ORDER — BUTALBITAL-APAP-CAFFEINE 50-325-40 MG PO TABS: 1.0000 | ORAL_TABLET | ORAL | Status: DC | PRN

## 2023-04-29 MED ORDER — HYDROMORPHONE HCL 1 MG/ML IJ SOLN
0.5000 mg | INTRAMUSCULAR | Status: DC | PRN
  Administered 2023-04-29: 0.5 mg via INTRAVENOUS
  Filled 2023-04-29: qty 0.5

## 2023-04-29 MED ORDER — HEPARIN SODIUM (PORCINE) 1000 UNIT/ML IJ SOLN
INTRAMUSCULAR | Status: AC
  Administered 2023-04-29: 1000 [IU]
  Filled 2023-04-29: qty 4

## 2023-04-29 MED ORDER — CARVEDILOL 12.5 MG PO TABS
12.5000 mg | ORAL_TABLET | Freq: Two times a day (BID) | ORAL | Status: DC
  Administered 2023-04-29: 12.5 mg via ORAL
  Filled 2023-04-29: qty 1

## 2023-04-29 MED ORDER — MUPIROCIN 2 % EX OINT
TOPICAL_OINTMENT | Freq: Three times a day (TID) | CUTANEOUS | 0 refills | Status: DC
  Filled 2023-04-29: qty 22, 30d supply, fill #0

## 2023-04-29 MED ORDER — CHLORHEXIDINE GLUCONATE CLOTH 2 % EX PADS
6.0000 | MEDICATED_PAD | Freq: Every day | CUTANEOUS | Status: DC
  Administered 2023-04-29: 6 via TOPICAL

## 2023-04-29 MED ORDER — CLOPIDOGREL BISULFATE 75 MG PO TABS
75.0000 mg | ORAL_TABLET | Freq: Every day | ORAL | 0 refills | Status: DC
  Filled 2023-04-29: qty 19, 19d supply, fill #0

## 2023-04-29 MED ORDER — GERHARDT'S BUTT CREAM
1.0000 | TOPICAL_CREAM | Freq: Two times a day (BID) | CUTANEOUS | 0 refills | Status: DC
  Filled 2023-04-29: qty 1, 1d supply, fill #0

## 2023-04-29 MED ORDER — AMLODIPINE-OLMESARTAN 10-40 MG PO TABS
1.0000 | ORAL_TABLET | Freq: Every evening | ORAL | 0 refills | Status: DC
  Filled 2023-04-29: qty 60, 60d supply, fill #0

## 2023-04-29 MED ORDER — ACETAMINOPHEN-CAFFEINE 500-65 MG PO TABS
1.0000 | ORAL_TABLET | Freq: Once | ORAL | Status: DC
  Filled 2023-04-29: qty 1

## 2023-04-29 MED ORDER — HYDRALAZINE HCL 25 MG PO TABS
25.0000 mg | ORAL_TABLET | Freq: Once | ORAL | Status: AC | PRN
  Administered 2023-04-29: 25 mg via ORAL
  Filled 2023-04-29: qty 1

## 2023-04-29 MED ORDER — BUTALBITAL-APAP-CAFFEINE 50-325-40 MG PO TABS
1.0000 | ORAL_TABLET | Freq: Four times a day (QID) | ORAL | Status: DC | PRN
  Administered 2023-04-29: 2 via ORAL
  Administered 2023-04-29: 1 via ORAL
  Filled 2023-04-29: qty 2
  Filled 2023-04-29: qty 1

## 2023-04-29 MED ORDER — ONDANSETRON 4 MG PO TBDP
4.0000 mg | ORAL_TABLET | Freq: Once | ORAL | Status: AC
  Administered 2023-04-29: 4 mg via ORAL
  Filled 2023-04-29: qty 1

## 2023-04-29 MED ORDER — HYDROMORPHONE HCL 2 MG PO TABS
1.0000 mg | ORAL_TABLET | Freq: Once | ORAL | Status: AC
  Administered 2023-04-29: 1 mg via ORAL
  Filled 2023-04-29: qty 1

## 2023-04-29 MED ORDER — CALCIUM ACETATE (PHOS BINDER) 667 MG PO CAPS
1334.0000 mg | ORAL_CAPSULE | Freq: Three times a day (TID) | ORAL | 0 refills | Status: AC
  Filled 2023-04-29: qty 360, 60d supply, fill #0

## 2023-04-29 MED ORDER — ASPIRIN 81 MG PO TBEC
81.0000 mg | DELAYED_RELEASE_TABLET | Freq: Every day | ORAL | 2 refills | Status: AC
Start: 2023-04-29 — End: ?
  Filled 2023-04-29: qty 120, 120d supply, fill #0

## 2023-04-29 MED ORDER — ALBUTEROL SULFATE HFA 108 (90 BASE) MCG/ACT IN AERS
2.0000 | INHALATION_SPRAY | RESPIRATORY_TRACT | 0 refills | Status: DC | PRN
  Filled 2023-04-29: qty 18, 64d supply, fill #0

## 2023-04-29 MED ORDER — CALCITRIOL 0.25 MCG PO CAPS
1.2500 ug | ORAL_CAPSULE | ORAL | 0 refills | Status: AC
Start: 2023-04-29 — End: ?
  Filled 2023-04-29: qty 30, 14d supply, fill #0

## 2023-04-29 MED ORDER — CARVEDILOL 12.5 MG PO TABS
12.5000 mg | ORAL_TABLET | Freq: Two times a day (BID) | ORAL | 0 refills | Status: DC
  Filled 2023-04-29: qty 60, 30d supply, fill #0

## 2023-04-29 MED ORDER — PSYLLIUM 95 % PO PACK
1.0000 | PACK | Freq: Every day | ORAL | 0 refills | Status: AC
  Filled 2023-04-29: qty 60, 60d supply, fill #0

## 2023-04-29 NOTE — Progress Notes (Signed)
[facility] Progress Note   Subjective:  Seen in [facility]. Completing dialysis. No new events.   Objective Vitals:   04/29/23 1300 04/29/23 1330 04/29/23 1429 04/29/23 1431  BP: 120/78 120/74  139/76  Pulse: 88 89  78  Resp: 16 19  16   Temp:    97.8 F (36.6 C)  TempSrc:      SpO2: 100% 99%  96%  Weight:   (S) 61.4 kg      Additional Objective Labs: Basic Metabolic Panel: Recent Labs  Lab 04/27/23 0649 04/28/23 0834 04/29/23 1046  NA 135 134* 134*  K 3.6 4.0 4.4  CL 94* 93* 90*  CO2 26 23 21*  GLUCOSE 73 192* 176*  BUN 26* 56* 83*  CREATININE 4.18* 6.63* 8.57*  CALCIUM 8.7* 8.7* 8.7*  PHOS 4.3 7.7* 8.5*   CBC: Recent Labs  Lab 04/26/23 1926 04/26/23 1929 04/27/23 0649 04/28/23 0834 04/29/23 1049  WBC 6.0  --  4.2 2.6* 3.3*  NEUTROABS 3.9  --   --   --  1.4*  HGB 11.2*   < > 11.6* 12.8 11.5*  HCT 37.6   < > 36.6 40.8 36.4  MCV 92.8  --  88.4 89.9 89.0  PLT 244  --  250 253 246   < > = values in this interval not displayed.   Blood Culture    Component Value Date/Time   SDES BLOOD RIGHT HAND 02/15/2023 1814   SPECREQUEST  02/15/2023 1814    BOTTLES DRAWN AEROBIC AND ANAEROBIC Blood Culture adequate volume   CULT  02/15/2023 1814    NO GROWTH 5 DAYS Performed at [facility], [address], [place], [place] [address]    REPTSTATUS 02/20/2023 FINAL 02/15/2023 1814     Physical Exam General: Alert, nad Heart: Regular rate Lungs: Normal wob  Abdomen: soft  Extremities: b/l AKAs Dialysis Access: LUE AVF +bruit   Medications:    albuterol  2.5 mg Nebulization Once   amLODipine  10 mg Oral Daily   aspirin EC  81 mg Oral Daily   atorvastatin  80 mg Oral Daily   bictegravir-emtricitabine-tenofovir AF  1 tablet Oral Daily   busPIRone  2.5 mg Oral BID   calcium acetate  2,001 mg Oral TID WC   carvedilol  12.5 mg Oral BID WC   Chlorhexidine Gluconate Cloth  6 each Topical Daily   clopidogrel  75 mg Oral Daily   insulin  aspart  5 Units Intravenous Once   And   dextrose  1 ampule Intravenous Once   escitalopram  5 mg Oral Daily   Gerhardt's butt cream   Topical BID   heparin  5,000 Units Subcutaneous Q8H   insulin aspart  0-6 Units Subcutaneous TID WC   irbesartan  300 mg Oral Daily   mupirocin ointment   Topical TID   psyllium  1 packet Oral Daily   sodium chloride flush  3 mL Intravenous Once    Dialysis Orders: TTS [facility]  4h  400/1.5   66.9kg   2K/3Ca bath  RIJ TDC / revised AVF LUA Heparin none - started 7/06 w/ one needle in AVF and other line in the catheter - mircera 150 mcg IV q 2 wks, last on 04/08/23 - Calcitriol 0.75 mcg po three times per week   Assessment/Plan: Stroke like symptoms/likely provoked seizure. - seen by neurology. Brain MRI negative for CVA. Aspirin, Plavix Per primary team.  PAD s/p R BKA stump infection  earlier in the summer (7/12) -> AKA now with b/l AKA's.  ESRD - on HD TTS.  HD today  HTN -home meds resumed. On amlodipine, irbesartan  Volume - UF to dry weight as able Anemia esrd - Hgb 12-13. No Aranesp  needed (last  given 04/08/23)  MBD ckd - Phos above goal. Cont po vdra and phoslo as binder.  HIV -Meds Per admit med team DM2 - per pmd Chronic diarrhea   Kathy Blase PA-C [facility] 04/29/2023,2:43 PM

## 2023-04-29 NOTE — Progress Notes (Addendum)
STROKE TEAM PROGRESS NOTE   BRIEF HPI [name] is a 47 y.o. female with history of CAD s/p stent placement, anemia, CAP in Feb of this year, stroke, depression, DM2, ESRD on HD, fecal incontinence, HIV, HLD, HTN, prior episodes of hypertensive emergency/urgency, diabetic retinopathy, PAD and bilateral AKA who presents to the ED via EMS as a Code Stroke for acute onset of left facial droop, slurred speech, left sided weakness and left hemineglect.   SIGNIFICANT HOSPITAL EVENTS 9/16: Admitted to the hospital, hypertensive to 235/100, hypokalemic to 7.0 9/17: Finish hemodialysis and early morning, altered mental status resolved, normokalemic, no focal deficits noted 9/19: Patient undergoing dialysis, currently at neurological/psychiatric baseline.  INTERIM HISTORY/SUBJECTIVE Patient was interviewed during HD -- no new concerns.  No focal deficits noted.  Had not been able to receive carotid ultrasound earlier today.  Plan to undergo CUS after dialysis, then likely discharge.  Plan continues to be DAPT for 3 weeks followed by aspirin indefinitely.   OBJECTIVE  CBC    Component Value Date/Time   WBC 3.3 (L) 04/29/2023 1049   RBC 4.09 04/29/2023 1049   HGB 11.5 (L) 04/29/2023 1049   HGB 9.2 (L) 10/19/2022 1204   HCT 36.4 04/29/2023 1049   HCT 28.6 (L) 10/19/2022 1204   PLT 246 04/29/2023 1049   PLT 343 10/19/2022 1204   MCV 89.0 04/29/2023 1049   MCV 91 10/19/2022 1204   MCH 28.1 04/29/2023 1049   MCHC 31.6 04/29/2023 1049   RDW 16.5 (H) 04/29/2023 1049   RDW 16.0 (H) 10/19/2022 1204   LYMPHSABS 1.1 04/29/2023 1049   LYMPHSABS 1.6 12/23/2017 1558   MONOABS 0.4 04/29/2023 1049   EOSABS 0.3 04/29/2023 1049   EOSABS 0.2 12/23/2017 1558   BASOSABS 0.1 04/29/2023 1049   BASOSABS 0.1 12/23/2017 1558    BMET    Component Value Date/Time   NA 134 (L) 04/29/2023 1046   NA 138 12/23/2017 1558   K 4.4 04/29/2023 1046   CL 90 (L) 04/29/2023 1046   CO2 21 (L) 04/29/2023 1046    GLUCOSE 176 (H) 04/29/2023 1046   BUN 83 (H) 04/29/2023 1046   BUN 28 (H) 12/23/2017 1558   CREATININE 8.57 (H) 04/29/2023 1046   CREATININE 12.07 (H) 05/29/2020 1155   CALCIUM 8.7 (L) 04/29/2023 1046   CALCIUM 8.5 (L) 11/25/2018 0309   GFRNONAA 5 (L) 04/29/2023 1046   GFRNONAA 79 11/14/2014 1154    IMAGING past 24 hours No results found.  Vitals:   04/29/23 1203 04/29/23 1231 04/29/23 1300 04/29/23 1330  BP: 122/68 120/64 120/78 120/74  Pulse: 86 88 88 89  Resp: 20 18 16 19   Temp:      TempSrc:      SpO2: 98% 97% 100% 99%  Weight:         PHYSICAL EXAM General: Alert, no acute distress, bilateral AKA sitting up in bed Psych:  Mood and affect appropriate for situation, somewhat tangential CV: Regular rate and rhythm on monitor Respiratory:  Regular, unlabored respirations on room air GI: Abdomen soft and nontender   NEURO:  Mental Status: AA&Ox3, patient is able to give clear and coherent history Speech/Language: speech is without dysarthria or aphasia.  Naming, repetition, fluency, and comprehension intact.  Cranial Nerves:  II: PERRL. Visual fields full.  III, IV, VI: EOMI. Eyelids elevate symmetrically.  V: Sensation is intact to light touch and symmetrical to face.  VII: Face is symmetrical resting and smiling VIII: hearing intact to  voice. IX, X: Palate elevates symmetrically. Phonation is normal.  WU:[id number] shrug 5/5. XII: tongue is midline without fasciculations. Motor: 5/5 strength to right upper and right lower limb tone, moves upper thighs (ISO bilateral AKA) Sensation: Intact to light touch bilaterally. Extinction absent to light touch to DSS.   Coordination: FTN intact bilaterally  Overall: Nonfocal   ASSESSMENT/PLAN  No change today.  Plan continues to be DAPT on aspirin 81 mg daily and clopidogrel 75 mg daily for 3 weeks and then aspirin alone indefinitely.  We will do carotid ultrasound today to inspect for ?carotid aneurysm, likely tortuous  vessels.  Patient is then cleared for discharge.  Likely provoked seizure in the setting of uremia, electrolyte derangements and hypertensive urgency, although TIA still in DDx Code Stroke CT head: No acute abnormality.  Old cortical infarct in right occipital lobe.  Old perforator infarcts in right lentiform nucleus/corona radiata as well as thalami.     MRI  No acute intracranial process. No evidence of acute or subacute infarct. MRA  No intracranial large vessel occlusion or significant stenosis. 2D Echo EF 60-65% CUS unremarkable EEG moderate diffuse encephalopathy. No seizures or epileptiform discharges  LDL 113 HgbA1c 6.1 in 02/2023 VTE prophylaxis -subcu heparin No  aspirin 81 mg daily prior to admission, now on aspirin 81 mg daily and clopidogrel 75 mg daily for 3 weeks and then ASA alone. No AED needed now given likely provoked seizure Therapy recommendations: HH PT and OT Disposition: TBD  Uremia  Hyperkalemia  ESRD on HD T/TH/SAT Cre 9.9 -> HD ->4.18->6.63 Potassium 6.9-> HD ->3.6->4.0 Received hemodialysis early a.m. of 9/17 Recent incomplete HD sessions due to late arrivals Nephrology on board  ? 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 This could be further evaluated with a CTA. CTA head and neck not able to perform due to no IV access CUS no significant finding on the extracranial ICA that can be sonicated  Recommend CTA head and neck as outpt once IV access can be achieved.   Hypertensive urgency Home meds: Azor 10-40 mg, Resumed amlodipine 10 mg daily, irbesartan 300 mg daily  BP still high 180s  Primary team managing Gradually normalize BP in 2-3 days Long term goal normotensive  Diabetes type II Controlled Home meds: Insulin glargine 15 units nightly HgbA1c 6.1 in 02/2023, goal < 7.0 CBGs SSI Recommend close follow-up with PCP for better DM  control  Hyperlipidemia  Home meds lipitor 80 LDL 113, goal < 70 Continue home lipitor 80 Medication compliance education provided Continue statin on discharge   Other Stroke Risk Factors Obesity, Body mass index is 30.48 kg/m., BMI >/= 30 associated with increased stroke risk, recommend weight loss, diet and exercise as appropriate  Coronary artery disease s/p stenting Migraine - on Ubrelvy for acute management HIV on HARRT PAD with b/l AKA Hx of stroke - CT showed multifocal chronic lacunar infarcts Former smoker   Hospital day # 0  Luiz Iron, MD Psychiatry resident, PGY 1 04/29/2023 1:53 PM  ATTENDING NOTE: I reviewed above note and agree with the assessment and plan. Pt was seen and examined.   Pt was seen at the HD unit. Pt is getting HD without complaint. Neuro stable, mildly drowsy sleepy. CUS unremarkable about the extracranial ICA on the left that can be sonicated, although can not be 100% ruled out aneurysm, but more likely the "aneurysm" saw on MRA could  be torturous or kinked vessel. Recommend outpt CTA head and neck to further eval once IV access can be achieved. Continue DAPT and statin. PT and OT recommend HH.  For detailed assessment and plan, please refer to above/below as I have made changes wherever appropriate.   Neurology will sign off. Please call with questions. Pt will follow up with stroke clinic NP at [facility] in about 4 weeks. Thanks for the consult.   Marvel Plan, MD PhD Stroke Neurology 04/29/2023 4:59 PM       To contact Stroke Continuity provider, please refer to [email or web address]. After hours, contact General Neurology

## 2023-04-29 NOTE — Progress Notes (Signed)
   04/29/23 1431  Vitals  Temp 97.8 F (36.6 C)  Pulse Rate 78  Resp 16  BP 139/76  SpO2 96 %  O2 Device Room Air  Oxygen Therapy  Patient Activity (if Appropriate) In bed  Pulse Oximetry Type Continuous  Post Treatment  Dialyzer Clearance Clear  Hemodialysis Intake (mL) 0 mL  Liters Processed 79.4  Fluid Removed (mL) 3700 mL  Tolerated HD Treatment Yes  Post-Hemodialysis Comments Pt started to vomit in the last 14 mins, UF off and pt. clean up for a lg vomiting and bed change. Report call to 3W Bedside RN Angelique Holm. Admin Medication per order   Received patient in bed to unit.  Alert and oriented.  Informed consent signed and in chart.   TX duration:3.47 mins  Patient tolerated well.  Transported back to the room  Alert, without acute distress.  Hand-off given to patient's nurse.   Access used: Yes Access issues: No  Total UF removed: 3700 Medication(s) given: See MAR Post HD VS: See Above Grid Post HD weight: 61.4 kg   [facility]

## 2023-04-29 NOTE — Progress Notes (Signed)
Pt has returned to floor from hemodialysis

## 2023-04-29 NOTE — TOC Transition Note (Signed)
Transition of Care [facility]) - CM/SW Discharge Note   Patient Details  Name: Kathy Frank MRN: [id number] Date of Birth: 25-Nov-1975  Transition of [facility] CM/SW Contact:  Kermit Balo, RN Phone Number: 04/29/2023, 4:32 PM   Clinical Narrative:     Pt is discharging home with home health continued through [facility]. Information on the AVS.  Wheelchair for home is in the room. Medications for home to be delivered to the room per [facility] pharmacy.  Pts daughter to provide transport home.  Final next level of care: Home w Home Health Services Barriers to Discharge: No Barriers Identified   Patient Goals and CMS Choice CMS Medicare.gov Compare Post Acute Care list provided to:: Patient Choice offered to / list presented to : Patient  Discharge Placement                         Discharge Plan and Services Additional resources added to the After Visit Summary for     Discharge Planning Services: CM Consult Post Acute Care Choice: Home Health, Durable Medical Equipment          DME Arranged: Lightweight manual wheelchair with seat cushion DME Agency: [employer] Date DME Agency Contacted: 04/27/23   Representative spoke with at DME Agency: [name] HH Arranged: PT HH Agency: [facility] Date [facility] Agency Contacted: 04/29/23   Representative spoke with at [facility] Agency: Tresa Endo  Social Determinants of Health (SDOH) Interventions SDOH Screenings   Food Insecurity: No Food Insecurity (04/27/2023)  Housing: Low Risk  (04/27/2023)  Transportation Needs: No Transportation Needs (04/27/2023)  Utilities: Not At Risk (04/27/2023)  Alcohol Screen: Low Risk  (10/19/2022)  Depression (PHQ2-9): Low Risk  (03/02/2023)  Financial Resource Strain: Low Risk  (10/19/2022)  Physical Activity: Inactive (10/19/2022)  Social Connections: Socially Isolated (10/19/2022)  Stress: Stress Concern Present (10/19/2022)  Tobacco Use: Medium Risk (04/27/2023)     Readmission Risk Interventions     12/24/2022   11:30 AM 05/23/2021    2:13 PM 04/22/2021    3:05 PM  Readmission Risk Prevention Plan  Transportation Screening Complete Complete Complete  Medication Review Oceanographer) Complete Complete Complete  PCP or Specialist appointment within 3-5 days of discharge Complete Complete Complete  HRI or Home Care Consult Complete Complete Complete  SW Recovery Care/Counseling Consult Complete Complete Complete  Palliative Care Screening Not Applicable Not Applicable Not Applicable  Skilled Nursing Facility Not Applicable Not Applicable Not Applicable

## 2023-04-29 NOTE — Progress Notes (Signed)
Pt has left floor with transport for Dialysis

## 2023-04-29 NOTE — Plan of Care (Signed)
  Problem: Education: Goal: Knowledge of General Education information will improve Description: Including pain rating scale, medication(s)/side effects and non-pharmacologic comfort measures 04/29/2023 2335 by Duffy Bruce, RN Outcome: Adequate for Discharge 04/29/2023 2334 by Duffy Bruce, RN Outcome: Adequate for Discharge   Problem: Health Behavior/Discharge Planning: Goal: Ability to manage health-related needs will improve 04/29/2023 2335 by Duffy Bruce, RN Outcome: Adequate for Discharge 04/29/2023 2334 by Duffy Bruce, RN Outcome: Adequate for Discharge   Problem: Skin Integrity: Goal: Risk for impaired skin integrity will decrease 04/29/2023 2335 by Duffy Bruce, RN Outcome: Adequate for Discharge 04/29/2023 2334 by Duffy Bruce, RN Outcome: Adequate for Discharge

## 2023-04-29 NOTE — Plan of Care (Signed)

## 2023-04-29 NOTE — Discharge Instructions (Addendum)
Kathy Frank   Please make sure you follow up with [place] in Clinic on 05/10/2023   Please only take Psyllium for your diarrhea.  Stop any other medications including:   -Senna -Docusate  For your seizures, these were thought to be provoked in the setting of decreased Hemodialysis sessions.   PLEASE BE ON TIME FOR YOUR HD SESSIONS, call [employer] if you need them to adjust your pick-up times.   Continue with  Plavix for 19 more days Continue taking Aspirin indefinitely   Take Phoslo daily  Take calcitriol three times a week on your dialysis days  For your wound on your Right leg stump:   PLEASE DO NOT PICK AT IT  Wash with soap and water  Apply mupirocin three times a day  Cover with clean gauze.   Take all your medications as instructed in the med list.  Please follow up with your PCP, Nephrology, and Infectious disease.

## 2023-04-29 NOTE — Plan of Care (Signed)
[facility] Patient Discharge Orders- [facility] CLINIC: [facility]  Patient's name: Kathy Frank Admit/DC Dates: 04/26/2023 - 04/29/23  Discharge Diagnoses: Stroke-like symptoms Aviva Signs seizure. Imaging neg for CVA Hyperkalemia   Aranesp: Given: --   Date and amount of last dose: --  Last Hgb: 11.5 PRBC's Given: -- Date/# of units: -- ESA dose for discharge: per protocol IV Iron dose at discharge: --  Heparin change: --  EDW Change: 66 kg  Bath Change: --  Access intervention/Change: --   Hectorol/Calcitriol change: --  Discharge Labs: Calcium 8.7 Phosphorus 8.5 Albumin 3.1 K+ 4.4  IV Antibiotics: -- Details:  On Coumadin?: -- -   OTHER/APPTS/LAB ORDERS:    D/C Meds to be reconciled by nurse after every discharge.  Completed By:  Tomasa Blase PA-C [facility]  Reviewed by: MD:______ RN_______

## 2023-04-29 NOTE — Progress Notes (Signed)
Pt's ride fell through for 8 pm from one daughter. Pt had to wait until her other daughter got off of work at 10:30 to come get her. Pt's daughter arrived to pick up pt at 2350. Pt in wheelchair with personal belongings brought down stairs and assisted into vehicle. Daughter placed wheelchair in back seat.

## 2023-04-29 NOTE — Progress Notes (Signed)
VASCULAR LAB    Carotid duplex has been performed.  See CV proc for preliminary results.   Ameliya Nicotra, RVT 04/29/2023, 4:06 PM

## 2023-04-29 NOTE — Discharge Summary (Signed)
Palpations: Abdomen is soft.     Tenderness: There is no abdominal tenderness. There is no guarding or rebound.  Musculoskeletal:     Right lower leg: No edema.     Left lower leg: No edema.  Skin:    Comments: Open wound on the right stump with serosanguinous fluid and pseudomembrane. No signs of erythema, malodorous discharge, or warmth to palpation.  Neurological:     General: No focal deficit present.     Mental Status: She is alert.      Sensory: Sensation is intact.     Motor: No weakness.     Pertinent Labs, Studies, and Procedures:     Latest Ref Rng & Units 04/29/2023   10:49 AM 04/28/2023    8:34 AM 04/27/2023    6:49 AM  CBC  WBC 4.0 - 10.5 K/uL 3.3  2.6  4.2   Hemoglobin 12.0 - 15.0 g/dL 16.1  09.6  04.5   Hematocrit 36.0 - 46.0 % 36.4  40.8  36.6   Platelets 150 - 400 K/uL 246  253  250        Latest Ref Rng & Units 04/29/2023   10:46 AM 04/28/2023    8:34 AM 04/27/2023    6:49 AM  CMP  Glucose 70 - 99 mg/dL 409  811  73   BUN 6 - 20 mg/dL 83  56  26   Creatinine 0.44 - 1.00 mg/dL 9.14  7.82  9.56   Sodium 135 - 145 mmol/L 134  134  135   Potassium 3.5 - 5.1 mmol/L 4.4  4.0  3.6   Chloride 98 - 111 mmol/L 90  93  94   CO2 22 - 32 mmol/L 21  23  26    Calcium 8.9 - 10.3 mg/dL 8.7  8.7  8.7     ECHOCARDIOGRAM COMPLETE BUBBLE STUDY  Result Date: 04/27/2023    ECHOCARDIOGRAM REPORT   Patient Name:   Kathy Frank Date of Exam: 04/27/2023 Medical Rec #:  [id number]       Height:       60.0 in Accession #:    [id number]      Weight:       163.1 lb Date of Birth:  1976-05-11        BSA:          1.712 m Patient Age:    47 years        BP:           208/86 mmHg Patient Gender: F               HR:           91 bpm. Exam Location:  Inpatient Procedure: 2D Echo, Color Doppler and Cardiac Doppler Indications:    Stroke-like symptoms [725552]  History:        Patient has prior history of Echocardiogram examinations, most                 recent 05/21/2021. CAD, PAD; Risk Factors:Diabetes, Former                 Smoker, Hypertension and Dyslipidemia.  Sonographer:    Aron Baba Referring Phys: [id number] JULIE MACHEN  Sonographer Comments: Image acquisition challenging due to uncooperative patient. IMPRESSIONS  1. Left ventricular ejection fraction, by estimation, is 60 to 65%. The left ventricle has normal function. The left ventricle has no regional wall motion abnormalities. There is severe left ventricular hypertrophy.  Palpations: Abdomen is soft.     Tenderness: There is no abdominal tenderness. There is no guarding or rebound.  Musculoskeletal:     Right lower leg: No edema.     Left lower leg: No edema.  Skin:    Comments: Open wound on the right stump with serosanguinous fluid and pseudomembrane. No signs of erythema, malodorous discharge, or warmth to palpation.  Neurological:     General: No focal deficit present.     Mental Status: She is alert.      Sensory: Sensation is intact.     Motor: No weakness.     Pertinent Labs, Studies, and Procedures:     Latest Ref Rng & Units 04/29/2023   10:49 AM 04/28/2023    8:34 AM 04/27/2023    6:49 AM  CBC  WBC 4.0 - 10.5 K/uL 3.3  2.6  4.2   Hemoglobin 12.0 - 15.0 g/dL 16.1  09.6  04.5   Hematocrit 36.0 - 46.0 % 36.4  40.8  36.6   Platelets 150 - 400 K/uL 246  253  250        Latest Ref Rng & Units 04/29/2023   10:46 AM 04/28/2023    8:34 AM 04/27/2023    6:49 AM  CMP  Glucose 70 - 99 mg/dL 409  811  73   BUN 6 - 20 mg/dL 83  56  26   Creatinine 0.44 - 1.00 mg/dL 9.14  7.82  9.56   Sodium 135 - 145 mmol/L 134  134  135   Potassium 3.5 - 5.1 mmol/L 4.4  4.0  3.6   Chloride 98 - 111 mmol/L 90  93  94   CO2 22 - 32 mmol/L 21  23  26    Calcium 8.9 - 10.3 mg/dL 8.7  8.7  8.7     ECHOCARDIOGRAM COMPLETE BUBBLE STUDY  Result Date: 04/27/2023    ECHOCARDIOGRAM REPORT   Patient Name:   Kathy Frank Date of Exam: 04/27/2023 Medical Rec #:  [id number]       Height:       60.0 in Accession #:    [id number]      Weight:       163.1 lb Date of Birth:  1976-05-11        BSA:          1.712 m Patient Age:    47 years        BP:           208/86 mmHg Patient Gender: F               HR:           91 bpm. Exam Location:  Inpatient Procedure: 2D Echo, Color Doppler and Cardiac Doppler Indications:    Stroke-like symptoms [725552]  History:        Patient has prior history of Echocardiogram examinations, most                 recent 05/21/2021. CAD, PAD; Risk Factors:Diabetes, Former                 Smoker, Hypertension and Dyslipidemia.  Sonographer:    Aron Baba Referring Phys: [id number] JULIE MACHEN  Sonographer Comments: Image acquisition challenging due to uncooperative patient. IMPRESSIONS  1. Left ventricular ejection fraction, by estimation, is 60 to 65%. The left ventricle has normal function. The left ventricle has no regional wall motion abnormalities. There is severe left ventricular hypertrophy.  Name: Kathy Frank MRN: [id number] DOB: 26-Aug-1975 47 y.o. PCP: Jeral Pinch, DO  Date of Admission: 04/26/2023  7:21 PM Date of Discharge: 04/29/2023 Attending Physician: Dr.  Antony Contras  DISCHARGE DIAGNOSIS:  Primary Problem: Stroke-like symptoms   Hospital Problems: Principal Problem:   Stroke-like symptoms Active Problems:   Diabetes mellitus type 2 in obese   Human immunodeficiency virus (HIV) disease (HCC)   ESRD on dialysis (HCC)   BKA stump complication (HCC)   Colitis    DISCHARGE MEDICATIONS:   Allergies as of 04/29/2023   No Known Allergies      Medication List     STOP taking these medications    gabapentin 100 MG capsule Commonly known as: NEURONTIN   Lokelma 5 g packet Generic drug: sodium zirconium cyclosilicate   ondansetron 8 MG disintegrating tablet Commonly known as: ZOFRAN-ODT   ondansetron 8 MG tablet Commonly known as: Zofran   polyethylene glycol 17 g packet Commonly known as: MIRALAX / GLYCOLAX   Senexon-S 8.6-50 MG tablet Generic drug: senna-docusate   senna 8.6 MG Tabs tablet Commonly known as: SENOKOT       TAKE these medications    Accu-Chek Softclix Lancets lancets Use as instructed   albuterol 108 (90 Base) MCG/ACT inhaler Commonly known as: VENTOLIN HFA Inhale 2 puffs into the lungs every 4 (four) hours as needed for wheezing or shortness of breath.   amLODipine-olmesartan 10-40 MG tablet Commonly known as: AZOR Take 1 tablet by mouth every evening.   aspirin EC 81 MG tablet Commonly known as: EQ Aspirin Adult Low Dose Take 1 tablet (81 mg total) by mouth daily. Swallow whole.   atorvastatin 80 MG tablet Commonly known as: LIPITOR Take 1 tablet (80 mg total) by mouth daily.   Biktarvy 50-200-25 MG Tabs tablet Generic drug: bictegravir-emtricitabine-tenofovir AF Take 1 tablet by mouth daily. OVERDUE FOR OFFICE FOLLOW UP - PLEASE CALL OUR OFFICE TO SCHEDULE [phone]   busPIRone 5 MG  tablet Commonly known as: BUSPAR Take 0.5 tablets (2.5 mg total) by mouth 2 (two) times daily.   calcitRIOL 0.25 MCG capsule Commonly known as: ROCALTROL Take 5 capsules (1.25 mcg total) by mouth Every Tuesday,Thursday,and Saturday with dialysis.   calcium acetate 667 MG capsule Commonly known as: PHOSLO Take 2 capsules (1,334 mg total) by mouth 3 (three) times daily with meals.   carvedilol 12.5 MG tablet Commonly known as: COREG Take 1 tablet (12.5 mg total) by mouth 2 (two) times daily with a meal.   clopidogrel 75 MG tablet Commonly known as: PLAVIX Take 1 tablet (75 mg total) by mouth daily.   cyclobenzaprine 5 MG tablet Commonly known as: FLEXERIL Take 5 mg by mouth 2 (two) times daily as needed for muscle spasms.   Darbepoetin Alfa 200 MCG/0.4ML Sosy injection Commonly known as: ARANESP Inject 0.4 mLs (200 mcg total) into the skin every Monday at 6 PM. What changed:  when to take this additional instructions   Dexcom G7 Sensor Misc Use to check blood sugar continuously   escitalopram 5 MG tablet Commonly known as: Lexapro Take 1 tablet (5 mg total) by mouth daily.   Gerhardt's butt cream Crea Apply 1 Application topically 2 (two) times daily.   glucose blood test strip Use as instructed   hydrocortisone-pramoxine rectal foam Commonly known as: PROCTOFOAM-HC Place 1 applicator rectally 2 (two) times daily.   Lantus SoloStar 100 UNIT/ML Solostar Pen Generic drug: insulin glargine Inject 15 Units into the skin at bedtime.   lidocaine  Name: Kathy Frank MRN: [id number] DOB: 26-Aug-1975 47 y.o. PCP: Jeral Pinch, DO  Date of Admission: 04/26/2023  7:21 PM Date of Discharge: 04/29/2023 Attending Physician: Dr.  Antony Contras  DISCHARGE DIAGNOSIS:  Primary Problem: Stroke-like symptoms   Hospital Problems: Principal Problem:   Stroke-like symptoms Active Problems:   Diabetes mellitus type 2 in obese   Human immunodeficiency virus (HIV) disease (HCC)   ESRD on dialysis (HCC)   BKA stump complication (HCC)   Colitis    DISCHARGE MEDICATIONS:   Allergies as of 04/29/2023   No Known Allergies      Medication List     STOP taking these medications    gabapentin 100 MG capsule Commonly known as: NEURONTIN   Lokelma 5 g packet Generic drug: sodium zirconium cyclosilicate   ondansetron 8 MG disintegrating tablet Commonly known as: ZOFRAN-ODT   ondansetron 8 MG tablet Commonly known as: Zofran   polyethylene glycol 17 g packet Commonly known as: MIRALAX / GLYCOLAX   Senexon-S 8.6-50 MG tablet Generic drug: senna-docusate   senna 8.6 MG Tabs tablet Commonly known as: SENOKOT       TAKE these medications    Accu-Chek Softclix Lancets lancets Use as instructed   albuterol 108 (90 Base) MCG/ACT inhaler Commonly known as: VENTOLIN HFA Inhale 2 puffs into the lungs every 4 (four) hours as needed for wheezing or shortness of breath.   amLODipine-olmesartan 10-40 MG tablet Commonly known as: AZOR Take 1 tablet by mouth every evening.   aspirin EC 81 MG tablet Commonly known as: EQ Aspirin Adult Low Dose Take 1 tablet (81 mg total) by mouth daily. Swallow whole.   atorvastatin 80 MG tablet Commonly known as: LIPITOR Take 1 tablet (80 mg total) by mouth daily.   Biktarvy 50-200-25 MG Tabs tablet Generic drug: bictegravir-emtricitabine-tenofovir AF Take 1 tablet by mouth daily. OVERDUE FOR OFFICE FOLLOW UP - PLEASE CALL OUR OFFICE TO SCHEDULE [phone]   busPIRone 5 MG  tablet Commonly known as: BUSPAR Take 0.5 tablets (2.5 mg total) by mouth 2 (two) times daily.   calcitRIOL 0.25 MCG capsule Commonly known as: ROCALTROL Take 5 capsules (1.25 mcg total) by mouth Every Tuesday,Thursday,and Saturday with dialysis.   calcium acetate 667 MG capsule Commonly known as: PHOSLO Take 2 capsules (1,334 mg total) by mouth 3 (three) times daily with meals.   carvedilol 12.5 MG tablet Commonly known as: COREG Take 1 tablet (12.5 mg total) by mouth 2 (two) times daily with a meal.   clopidogrel 75 MG tablet Commonly known as: PLAVIX Take 1 tablet (75 mg total) by mouth daily.   cyclobenzaprine 5 MG tablet Commonly known as: FLEXERIL Take 5 mg by mouth 2 (two) times daily as needed for muscle spasms.   Darbepoetin Alfa 200 MCG/0.4ML Sosy injection Commonly known as: ARANESP Inject 0.4 mLs (200 mcg total) into the skin every Monday at 6 PM. What changed:  when to take this additional instructions   Dexcom G7 Sensor Misc Use to check blood sugar continuously   escitalopram 5 MG tablet Commonly known as: Lexapro Take 1 tablet (5 mg total) by mouth daily.   Gerhardt's butt cream Crea Apply 1 Application topically 2 (two) times daily.   glucose blood test strip Use as instructed   hydrocortisone-pramoxine rectal foam Commonly known as: PROCTOFOAM-HC Place 1 applicator rectally 2 (two) times daily.   Lantus SoloStar 100 UNIT/ML Solostar Pen Generic drug: insulin glargine Inject 15 Units into the skin at bedtime.   lidocaine  Palpations: Abdomen is soft.     Tenderness: There is no abdominal tenderness. There is no guarding or rebound.  Musculoskeletal:     Right lower leg: No edema.     Left lower leg: No edema.  Skin:    Comments: Open wound on the right stump with serosanguinous fluid and pseudomembrane. No signs of erythema, malodorous discharge, or warmth to palpation.  Neurological:     General: No focal deficit present.     Mental Status: She is alert.      Sensory: Sensation is intact.     Motor: No weakness.     Pertinent Labs, Studies, and Procedures:     Latest Ref Rng & Units 04/29/2023   10:49 AM 04/28/2023    8:34 AM 04/27/2023    6:49 AM  CBC  WBC 4.0 - 10.5 K/uL 3.3  2.6  4.2   Hemoglobin 12.0 - 15.0 g/dL 16.1  09.6  04.5   Hematocrit 36.0 - 46.0 % 36.4  40.8  36.6   Platelets 150 - 400 K/uL 246  253  250        Latest Ref Rng & Units 04/29/2023   10:46 AM 04/28/2023    8:34 AM 04/27/2023    6:49 AM  CMP  Glucose 70 - 99 mg/dL 409  811  73   BUN 6 - 20 mg/dL 83  56  26   Creatinine 0.44 - 1.00 mg/dL 9.14  7.82  9.56   Sodium 135 - 145 mmol/L 134  134  135   Potassium 3.5 - 5.1 mmol/L 4.4  4.0  3.6   Chloride 98 - 111 mmol/L 90  93  94   CO2 22 - 32 mmol/L 21  23  26    Calcium 8.9 - 10.3 mg/dL 8.7  8.7  8.7     ECHOCARDIOGRAM COMPLETE BUBBLE STUDY  Result Date: 04/27/2023    ECHOCARDIOGRAM REPORT   Patient Name:   Kathy Frank Date of Exam: 04/27/2023 Medical Rec #:  [id number]       Height:       60.0 in Accession #:    [id number]      Weight:       163.1 lb Date of Birth:  1976-05-11        BSA:          1.712 m Patient Age:    47 years        BP:           208/86 mmHg Patient Gender: F               HR:           91 bpm. Exam Location:  Inpatient Procedure: 2D Echo, Color Doppler and Cardiac Doppler Indications:    Stroke-like symptoms [725552]  History:        Patient has prior history of Echocardiogram examinations, most                 recent 05/21/2021. CAD, PAD; Risk Factors:Diabetes, Former                 Smoker, Hypertension and Dyslipidemia.  Sonographer:    Aron Baba Referring Phys: [id number] JULIE MACHEN  Sonographer Comments: Image acquisition challenging due to uncooperative patient. IMPRESSIONS  1. Left ventricular ejection fraction, by estimation, is 60 to 65%. The left ventricle has normal function. The left ventricle has no regional wall motion abnormalities. There is severe left ventricular hypertrophy.  Palpations: Abdomen is soft.     Tenderness: There is no abdominal tenderness. There is no guarding or rebound.  Musculoskeletal:     Right lower leg: No edema.     Left lower leg: No edema.  Skin:    Comments: Open wound on the right stump with serosanguinous fluid and pseudomembrane. No signs of erythema, malodorous discharge, or warmth to palpation.  Neurological:     General: No focal deficit present.     Mental Status: She is alert.      Sensory: Sensation is intact.     Motor: No weakness.     Pertinent Labs, Studies, and Procedures:     Latest Ref Rng & Units 04/29/2023   10:49 AM 04/28/2023    8:34 AM 04/27/2023    6:49 AM  CBC  WBC 4.0 - 10.5 K/uL 3.3  2.6  4.2   Hemoglobin 12.0 - 15.0 g/dL 16.1  09.6  04.5   Hematocrit 36.0 - 46.0 % 36.4  40.8  36.6   Platelets 150 - 400 K/uL 246  253  250        Latest Ref Rng & Units 04/29/2023   10:46 AM 04/28/2023    8:34 AM 04/27/2023    6:49 AM  CMP  Glucose 70 - 99 mg/dL 409  811  73   BUN 6 - 20 mg/dL 83  56  26   Creatinine 0.44 - 1.00 mg/dL 9.14  7.82  9.56   Sodium 135 - 145 mmol/L 134  134  135   Potassium 3.5 - 5.1 mmol/L 4.4  4.0  3.6   Chloride 98 - 111 mmol/L 90  93  94   CO2 22 - 32 mmol/L 21  23  26    Calcium 8.9 - 10.3 mg/dL 8.7  8.7  8.7     ECHOCARDIOGRAM COMPLETE BUBBLE STUDY  Result Date: 04/27/2023    ECHOCARDIOGRAM REPORT   Patient Name:   Kathy Frank Date of Exam: 04/27/2023 Medical Rec #:  [id number]       Height:       60.0 in Accession #:    [id number]      Weight:       163.1 lb Date of Birth:  1976-05-11        BSA:          1.712 m Patient Age:    47 years        BP:           208/86 mmHg Patient Gender: F               HR:           91 bpm. Exam Location:  Inpatient Procedure: 2D Echo, Color Doppler and Cardiac Doppler Indications:    Stroke-like symptoms [725552]  History:        Patient has prior history of Echocardiogram examinations, most                 recent 05/21/2021. CAD, PAD; Risk Factors:Diabetes, Former                 Smoker, Hypertension and Dyslipidemia.  Sonographer:    Aron Baba Referring Phys: [id number] JULIE MACHEN  Sonographer Comments: Image acquisition challenging due to uncooperative patient. IMPRESSIONS  1. Left ventricular ejection fraction, by estimation, is 60 to 65%. The left ventricle has normal function. The left ventricle has no regional wall motion abnormalities. There is severe left ventricular hypertrophy.  Name: Kathy Frank MRN: [id number] DOB: 26-Aug-1975 47 y.o. PCP: Jeral Pinch, DO  Date of Admission: 04/26/2023  7:21 PM Date of Discharge: 04/29/2023 Attending Physician: Dr.  Antony Contras  DISCHARGE DIAGNOSIS:  Primary Problem: Stroke-like symptoms   Hospital Problems: Principal Problem:   Stroke-like symptoms Active Problems:   Diabetes mellitus type 2 in obese   Human immunodeficiency virus (HIV) disease (HCC)   ESRD on dialysis (HCC)   BKA stump complication (HCC)   Colitis    DISCHARGE MEDICATIONS:   Allergies as of 04/29/2023   No Known Allergies      Medication List     STOP taking these medications    gabapentin 100 MG capsule Commonly known as: NEURONTIN   Lokelma 5 g packet Generic drug: sodium zirconium cyclosilicate   ondansetron 8 MG disintegrating tablet Commonly known as: ZOFRAN-ODT   ondansetron 8 MG tablet Commonly known as: Zofran   polyethylene glycol 17 g packet Commonly known as: MIRALAX / GLYCOLAX   Senexon-S 8.6-50 MG tablet Generic drug: senna-docusate   senna 8.6 MG Tabs tablet Commonly known as: SENOKOT       TAKE these medications    Accu-Chek Softclix Lancets lancets Use as instructed   albuterol 108 (90 Base) MCG/ACT inhaler Commonly known as: VENTOLIN HFA Inhale 2 puffs into the lungs every 4 (four) hours as needed for wheezing or shortness of breath.   amLODipine-olmesartan 10-40 MG tablet Commonly known as: AZOR Take 1 tablet by mouth every evening.   aspirin EC 81 MG tablet Commonly known as: EQ Aspirin Adult Low Dose Take 1 tablet (81 mg total) by mouth daily. Swallow whole.   atorvastatin 80 MG tablet Commonly known as: LIPITOR Take 1 tablet (80 mg total) by mouth daily.   Biktarvy 50-200-25 MG Tabs tablet Generic drug: bictegravir-emtricitabine-tenofovir AF Take 1 tablet by mouth daily. OVERDUE FOR OFFICE FOLLOW UP - PLEASE CALL OUR OFFICE TO SCHEDULE [phone]   busPIRone 5 MG  tablet Commonly known as: BUSPAR Take 0.5 tablets (2.5 mg total) by mouth 2 (two) times daily.   calcitRIOL 0.25 MCG capsule Commonly known as: ROCALTROL Take 5 capsules (1.25 mcg total) by mouth Every Tuesday,Thursday,and Saturday with dialysis.   calcium acetate 667 MG capsule Commonly known as: PHOSLO Take 2 capsules (1,334 mg total) by mouth 3 (three) times daily with meals.   carvedilol 12.5 MG tablet Commonly known as: COREG Take 1 tablet (12.5 mg total) by mouth 2 (two) times daily with a meal.   clopidogrel 75 MG tablet Commonly known as: PLAVIX Take 1 tablet (75 mg total) by mouth daily.   cyclobenzaprine 5 MG tablet Commonly known as: FLEXERIL Take 5 mg by mouth 2 (two) times daily as needed for muscle spasms.   Darbepoetin Alfa 200 MCG/0.4ML Sosy injection Commonly known as: ARANESP Inject 0.4 mLs (200 mcg total) into the skin every Monday at 6 PM. What changed:  when to take this additional instructions   Dexcom G7 Sensor Misc Use to check blood sugar continuously   escitalopram 5 MG tablet Commonly known as: Lexapro Take 1 tablet (5 mg total) by mouth daily.   Gerhardt's butt cream Crea Apply 1 Application topically 2 (two) times daily.   glucose blood test strip Use as instructed   hydrocortisone-pramoxine rectal foam Commonly known as: PROCTOFOAM-HC Place 1 applicator rectally 2 (two) times daily.   Lantus SoloStar 100 UNIT/ML Solostar Pen Generic drug: insulin glargine Inject 15 Units into the skin at bedtime.   lidocaine  Palpations: Abdomen is soft.     Tenderness: There is no abdominal tenderness. There is no guarding or rebound.  Musculoskeletal:     Right lower leg: No edema.     Left lower leg: No edema.  Skin:    Comments: Open wound on the right stump with serosanguinous fluid and pseudomembrane. No signs of erythema, malodorous discharge, or warmth to palpation.  Neurological:     General: No focal deficit present.     Mental Status: She is alert.      Sensory: Sensation is intact.     Motor: No weakness.     Pertinent Labs, Studies, and Procedures:     Latest Ref Rng & Units 04/29/2023   10:49 AM 04/28/2023    8:34 AM 04/27/2023    6:49 AM  CBC  WBC 4.0 - 10.5 K/uL 3.3  2.6  4.2   Hemoglobin 12.0 - 15.0 g/dL 16.1  09.6  04.5   Hematocrit 36.0 - 46.0 % 36.4  40.8  36.6   Platelets 150 - 400 K/uL 246  253  250        Latest Ref Rng & Units 04/29/2023   10:46 AM 04/28/2023    8:34 AM 04/27/2023    6:49 AM  CMP  Glucose 70 - 99 mg/dL 409  811  73   BUN 6 - 20 mg/dL 83  56  26   Creatinine 0.44 - 1.00 mg/dL 9.14  7.82  9.56   Sodium 135 - 145 mmol/L 134  134  135   Potassium 3.5 - 5.1 mmol/L 4.4  4.0  3.6   Chloride 98 - 111 mmol/L 90  93  94   CO2 22 - 32 mmol/L 21  23  26    Calcium 8.9 - 10.3 mg/dL 8.7  8.7  8.7     ECHOCARDIOGRAM COMPLETE BUBBLE STUDY  Result Date: 04/27/2023    ECHOCARDIOGRAM REPORT   Patient Name:   Kathy Frank Date of Exam: 04/27/2023 Medical Rec #:  [id number]       Height:       60.0 in Accession #:    [id number]      Weight:       163.1 lb Date of Birth:  1976-05-11        BSA:          1.712 m Patient Age:    47 years        BP:           208/86 mmHg Patient Gender: F               HR:           91 bpm. Exam Location:  Inpatient Procedure: 2D Echo, Color Doppler and Cardiac Doppler Indications:    Stroke-like symptoms [725552]  History:        Patient has prior history of Echocardiogram examinations, most                 recent 05/21/2021. CAD, PAD; Risk Factors:Diabetes, Former                 Smoker, Hypertension and Dyslipidemia.  Sonographer:    Aron Baba Referring Phys: [id number] JULIE MACHEN  Sonographer Comments: Image acquisition challenging due to uncooperative patient. IMPRESSIONS  1. Left ventricular ejection fraction, by estimation, is 60 to 65%. The left ventricle has normal function. The left ventricle has no regional wall motion abnormalities. There is severe left ventricular hypertrophy.  Palpations: Abdomen is soft.     Tenderness: There is no abdominal tenderness. There is no guarding or rebound.  Musculoskeletal:     Right lower leg: No edema.     Left lower leg: No edema.  Skin:    Comments: Open wound on the right stump with serosanguinous fluid and pseudomembrane. No signs of erythema, malodorous discharge, or warmth to palpation.  Neurological:     General: No focal deficit present.     Mental Status: She is alert.      Sensory: Sensation is intact.     Motor: No weakness.     Pertinent Labs, Studies, and Procedures:     Latest Ref Rng & Units 04/29/2023   10:49 AM 04/28/2023    8:34 AM 04/27/2023    6:49 AM  CBC  WBC 4.0 - 10.5 K/uL 3.3  2.6  4.2   Hemoglobin 12.0 - 15.0 g/dL 16.1  09.6  04.5   Hematocrit 36.0 - 46.0 % 36.4  40.8  36.6   Platelets 150 - 400 K/uL 246  253  250        Latest Ref Rng & Units 04/29/2023   10:46 AM 04/28/2023    8:34 AM 04/27/2023    6:49 AM  CMP  Glucose 70 - 99 mg/dL 409  811  73   BUN 6 - 20 mg/dL 83  56  26   Creatinine 0.44 - 1.00 mg/dL 9.14  7.82  9.56   Sodium 135 - 145 mmol/L 134  134  135   Potassium 3.5 - 5.1 mmol/L 4.4  4.0  3.6   Chloride 98 - 111 mmol/L 90  93  94   CO2 22 - 32 mmol/L 21  23  26    Calcium 8.9 - 10.3 mg/dL 8.7  8.7  8.7     ECHOCARDIOGRAM COMPLETE BUBBLE STUDY  Result Date: 04/27/2023    ECHOCARDIOGRAM REPORT   Patient Name:   Kathy Frank Date of Exam: 04/27/2023 Medical Rec #:  [id number]       Height:       60.0 in Accession #:    [id number]      Weight:       163.1 lb Date of Birth:  1976-05-11        BSA:          1.712 m Patient Age:    47 years        BP:           208/86 mmHg Patient Gender: F               HR:           91 bpm. Exam Location:  Inpatient Procedure: 2D Echo, Color Doppler and Cardiac Doppler Indications:    Stroke-like symptoms [725552]  History:        Patient has prior history of Echocardiogram examinations, most                 recent 05/21/2021. CAD, PAD; Risk Factors:Diabetes, Former                 Smoker, Hypertension and Dyslipidemia.  Sonographer:    Aron Baba Referring Phys: [id number] JULIE MACHEN  Sonographer Comments: Image acquisition challenging due to uncooperative patient. IMPRESSIONS  1. Left ventricular ejection fraction, by estimation, is 60 to 65%. The left ventricle has normal function. The left ventricle has no regional wall motion abnormalities. There is severe left ventricular hypertrophy.

## 2023-04-30 ENCOUNTER — Other Ambulatory Visit (HOSPITAL_COMMUNITY): Payer: Self-pay

## 2023-04-30 LAB — T-HELPER CELLS (CD4) COUNT (NOT AT ARMC)
CD4 % Helper T Cell: 38 % (ref 33–65)
CD4 T Cell Abs: 313 /uL — ABNORMAL LOW (ref 400–1790)

## 2023-04-30 LAB — HIV-1 RNA QUANT-NO REFLEX-BLD
HIV 1 RNA Quant: <20 copies/mL
LOG10 HIV-1 RNA: UNDETERMINED log10copy/mL

## 2023-04-30 NOTE — Progress Notes (Signed)
Late Note Entry- April 30, 2023  Pt was d/c to home yesterday afternoon. Contacted [facility] this morning to advise clinic of pt's d/c date and that pt should resume care tomorrow.   Olivia Canter Renal Navigator [phone]

## 2023-05-01 DIAGNOSIS — L299 Pruritus, unspecified: Secondary | ICD-10-CM | POA: Diagnosis not present

## 2023-05-01 DIAGNOSIS — N2581 Secondary hyperparathyroidism of renal origin: Secondary | ICD-10-CM | POA: Diagnosis not present

## 2023-05-01 DIAGNOSIS — D689 Coagulation defect, unspecified: Secondary | ICD-10-CM | POA: Diagnosis not present

## 2023-05-01 DIAGNOSIS — R197 Diarrhea, unspecified: Secondary | ICD-10-CM | POA: Diagnosis not present

## 2023-05-01 DIAGNOSIS — N186 End stage renal disease: Secondary | ICD-10-CM | POA: Diagnosis not present

## 2023-05-01 DIAGNOSIS — Z992 Dependence on renal dialysis: Secondary | ICD-10-CM | POA: Diagnosis not present

## 2023-05-01 DIAGNOSIS — D631 Anemia in chronic kidney disease: Secondary | ICD-10-CM | POA: Diagnosis not present

## 2023-05-01 DIAGNOSIS — T8249XA Other complication of vascular dialysis catheter, initial encounter: Secondary | ICD-10-CM | POA: Diagnosis not present

## 2023-05-03 DIAGNOSIS — T879 Unspecified complications of amputation stump: Secondary | ICD-10-CM | POA: Diagnosis not present

## 2023-05-03 DIAGNOSIS — Z89612 Acquired absence of left leg above knee: Secondary | ICD-10-CM | POA: Diagnosis not present

## 2023-05-04 DIAGNOSIS — D631 Anemia in chronic kidney disease: Secondary | ICD-10-CM | POA: Diagnosis not present

## 2023-05-04 DIAGNOSIS — D689 Coagulation defect, unspecified: Secondary | ICD-10-CM | POA: Diagnosis not present

## 2023-05-04 DIAGNOSIS — N186 End stage renal disease: Secondary | ICD-10-CM | POA: Diagnosis not present

## 2023-05-04 DIAGNOSIS — N2581 Secondary hyperparathyroidism of renal origin: Secondary | ICD-10-CM | POA: Diagnosis not present

## 2023-05-04 DIAGNOSIS — L299 Pruritus, unspecified: Secondary | ICD-10-CM | POA: Diagnosis not present

## 2023-05-04 DIAGNOSIS — T8249XA Other complication of vascular dialysis catheter, initial encounter: Secondary | ICD-10-CM | POA: Diagnosis not present

## 2023-05-04 DIAGNOSIS — R197 Diarrhea, unspecified: Secondary | ICD-10-CM | POA: Diagnosis not present

## 2023-05-04 DIAGNOSIS — Z992 Dependence on renal dialysis: Secondary | ICD-10-CM | POA: Diagnosis not present

## 2023-05-05 ENCOUNTER — Telehealth: Payer: Self-pay

## 2023-05-05 ENCOUNTER — Other Ambulatory Visit: Payer: Self-pay | Admitting: *Deleted

## 2023-05-05 ENCOUNTER — Ambulatory Visit: Payer: 59 | Admitting: Infectious Diseases

## 2023-05-05 MED ORDER — PENTIPS 32G X 4 MM MISC: 3 refills | Status: DC

## 2023-05-05 NOTE — Telephone Encounter (Signed)
Erin with [facility](PT )- states she has not been able to get in touch with pt to start evaluation for PT. Wanted to informed PCP, she will tried again on Friday 05/07/2023.

## 2023-05-05 NOTE — Patient Outreach (Signed)
Care Coordination   Follow Up Visit Note   05/05/2023 Name: ABEER HULTS MRN: [id number] DOB: [birth year]  Gerlene Fee Schirra is a 47 y.o. year old female who sees Tawkaliyar, Roya, DO for primary care. I spoke with  [name] by phone today.  What matters to the patients health and wellness today?  The RN Care Coordinator received a call from the patient who had been discharged from the hospital on 04/29/23 with a lightweight wheelchair but without a cushion. The patient requested follow-up regarding the delivery of the cushion. After consulting with Fabio Neighbors, RN, I obtained the necessary information to call [employer]. Subsequently, I spoke with Ian Malkin, who confirmed that the patient would receive the cushion today. The patient was  notified.      SDOH assessments and interventions completed:  No     Care Coordination Interventions:  Yes, provided   Interventions Today    Flowsheet Row Most Recent Value  Chronic Disease   Chronic disease during today's visit Other  [Wheelchair]  General Interventions   General Interventions Discussed/Reviewed Communication with, Durable Medical Equipment (DME)  Durable Medical Equipment (DME) Wheelchair  Wheelchair Standard  [place] with [employer]]  Communication with RN  Fabio Neighbors RN]        Follow up plan: Follow up call scheduled for 05/21/23  1130 am    Encounter Outcome:  Patient Visit Completed   Juanell Fairly RN, BSN, [facility]   Care Coordinator Phone: [phone]

## 2023-05-05 NOTE — Telephone Encounter (Signed)
Caller: Patient  Concern: small area along incision line at AKA w/ small amount of bloody drainage from a scab-like area  Pt thinks when she was carried to the ED via EMS, her stump may have gotten hit  Pt denies any odor, redness, or tenderness  Location: right leg  Description:  x 1 wk  Treatments:  ED instructed pt to wash daily, apply antibacterial ointment TID, cover with gauze  Resolution: Instructed to call back if symptoms perist

## 2023-05-06 DIAGNOSIS — L299 Pruritus, unspecified: Secondary | ICD-10-CM | POA: Diagnosis not present

## 2023-05-06 DIAGNOSIS — N2581 Secondary hyperparathyroidism of renal origin: Secondary | ICD-10-CM | POA: Diagnosis not present

## 2023-05-06 DIAGNOSIS — D689 Coagulation defect, unspecified: Secondary | ICD-10-CM | POA: Diagnosis not present

## 2023-05-06 DIAGNOSIS — N186 End stage renal disease: Secondary | ICD-10-CM | POA: Diagnosis not present

## 2023-05-06 DIAGNOSIS — R197 Diarrhea, unspecified: Secondary | ICD-10-CM | POA: Diagnosis not present

## 2023-05-06 DIAGNOSIS — T8249XA Other complication of vascular dialysis catheter, initial encounter: Secondary | ICD-10-CM | POA: Diagnosis not present

## 2023-05-06 DIAGNOSIS — Z992 Dependence on renal dialysis: Secondary | ICD-10-CM | POA: Diagnosis not present

## 2023-05-06 DIAGNOSIS — D631 Anemia in chronic kidney disease: Secondary | ICD-10-CM | POA: Diagnosis not present

## 2023-05-07 DIAGNOSIS — G43909 Migraine, unspecified, not intractable, without status migrainosus: Secondary | ICD-10-CM | POA: Diagnosis not present

## 2023-05-07 DIAGNOSIS — E1143 Type 2 diabetes mellitus with diabetic autonomic (poly)neuropathy: Secondary | ICD-10-CM | POA: Diagnosis not present

## 2023-05-07 DIAGNOSIS — E785 Hyperlipidemia, unspecified: Secondary | ICD-10-CM | POA: Diagnosis not present

## 2023-05-07 DIAGNOSIS — K3184 Gastroparesis: Secondary | ICD-10-CM | POA: Diagnosis not present

## 2023-05-07 DIAGNOSIS — I120 Hypertensive chronic kidney disease with stage 5 chronic kidney disease or end stage renal disease: Secondary | ICD-10-CM | POA: Diagnosis not present

## 2023-05-07 DIAGNOSIS — E1151 Type 2 diabetes mellitus with diabetic peripheral angiopathy without gangrene: Secondary | ICD-10-CM | POA: Diagnosis not present

## 2023-05-07 DIAGNOSIS — R569 Unspecified convulsions: Secondary | ICD-10-CM | POA: Diagnosis not present

## 2023-05-07 DIAGNOSIS — K529 Noninfective gastroenteritis and colitis, unspecified: Secondary | ICD-10-CM | POA: Diagnosis not present

## 2023-05-07 DIAGNOSIS — D631 Anemia in chronic kidney disease: Secondary | ICD-10-CM | POA: Diagnosis not present

## 2023-05-07 DIAGNOSIS — Z6823 Body mass index (BMI) 23.0-23.9, adult: Secondary | ICD-10-CM | POA: Diagnosis not present

## 2023-05-07 DIAGNOSIS — Z8673 Personal history of transient ischemic attack (TIA), and cerebral infarction without residual deficits: Secondary | ICD-10-CM | POA: Diagnosis not present

## 2023-05-07 DIAGNOSIS — Z89612 Acquired absence of left leg above knee: Secondary | ICD-10-CM | POA: Diagnosis not present

## 2023-05-07 DIAGNOSIS — E113393 Type 2 diabetes mellitus with moderate nonproliferative diabetic retinopathy without macular edema, bilateral: Secondary | ICD-10-CM | POA: Diagnosis not present

## 2023-05-07 DIAGNOSIS — D62 Acute posthemorrhagic anemia: Secondary | ICD-10-CM | POA: Diagnosis not present

## 2023-05-07 DIAGNOSIS — K2100 Gastro-esophageal reflux disease with esophagitis, without bleeding: Secondary | ICD-10-CM | POA: Diagnosis not present

## 2023-05-07 DIAGNOSIS — I2510 Atherosclerotic heart disease of native coronary artery without angina pectoris: Secondary | ICD-10-CM | POA: Diagnosis not present

## 2023-05-07 DIAGNOSIS — E1122 Type 2 diabetes mellitus with diabetic chronic kidney disease: Secondary | ICD-10-CM | POA: Diagnosis not present

## 2023-05-07 DIAGNOSIS — N186 End stage renal disease: Secondary | ICD-10-CM | POA: Diagnosis not present

## 2023-05-07 DIAGNOSIS — E1142 Type 2 diabetes mellitus with diabetic polyneuropathy: Secondary | ICD-10-CM | POA: Diagnosis not present

## 2023-05-07 DIAGNOSIS — Z89611 Acquired absence of right leg above knee: Secondary | ICD-10-CM | POA: Diagnosis not present

## 2023-05-07 DIAGNOSIS — Z992 Dependence on renal dialysis: Secondary | ICD-10-CM | POA: Diagnosis not present

## 2023-05-08 DIAGNOSIS — N2581 Secondary hyperparathyroidism of renal origin: Secondary | ICD-10-CM | POA: Diagnosis not present

## 2023-05-08 DIAGNOSIS — D689 Coagulation defect, unspecified: Secondary | ICD-10-CM | POA: Diagnosis not present

## 2023-05-08 DIAGNOSIS — Z992 Dependence on renal dialysis: Secondary | ICD-10-CM | POA: Diagnosis not present

## 2023-05-08 DIAGNOSIS — T8249XA Other complication of vascular dialysis catheter, initial encounter: Secondary | ICD-10-CM | POA: Diagnosis not present

## 2023-05-08 DIAGNOSIS — R197 Diarrhea, unspecified: Secondary | ICD-10-CM | POA: Diagnosis not present

## 2023-05-08 DIAGNOSIS — L299 Pruritus, unspecified: Secondary | ICD-10-CM | POA: Diagnosis not present

## 2023-05-08 DIAGNOSIS — D631 Anemia in chronic kidney disease: Secondary | ICD-10-CM | POA: Diagnosis not present

## 2023-05-08 DIAGNOSIS — N186 End stage renal disease: Secondary | ICD-10-CM | POA: Diagnosis not present

## 2023-05-10 ENCOUNTER — Ambulatory Visit (INDEPENDENT_AMBULATORY_CARE_PROVIDER_SITE_OTHER): Payer: 59 | Admitting: Dietician

## 2023-05-10 ENCOUNTER — Encounter: Payer: Self-pay | Admitting: Student

## 2023-05-10 ENCOUNTER — Telehealth: Payer: Self-pay | Admitting: *Deleted

## 2023-05-10 ENCOUNTER — Ambulatory Visit: Payer: 59 | Admitting: Student

## 2023-05-10 VITALS — BP 179/57 | HR 89 | Temp 98.4°F | Ht 60.0 in

## 2023-05-10 DIAGNOSIS — E1169 Type 2 diabetes mellitus with other specified complication: Secondary | ICD-10-CM | POA: Diagnosis not present

## 2023-05-10 DIAGNOSIS — I10 Essential (primary) hypertension: Secondary | ICD-10-CM

## 2023-05-10 DIAGNOSIS — Z794 Long term (current) use of insulin: Secondary | ICD-10-CM | POA: Diagnosis not present

## 2023-05-10 DIAGNOSIS — I120 Hypertensive chronic kidney disease with stage 5 chronic kidney disease or end stage renal disease: Secondary | ICD-10-CM | POA: Diagnosis not present

## 2023-05-10 DIAGNOSIS — E1122 Type 2 diabetes mellitus with diabetic chronic kidney disease: Secondary | ICD-10-CM | POA: Diagnosis not present

## 2023-05-10 DIAGNOSIS — N186 End stage renal disease: Secondary | ICD-10-CM

## 2023-05-10 DIAGNOSIS — Z992 Dependence on renal dialysis: Secondary | ICD-10-CM | POA: Diagnosis not present

## 2023-05-10 DIAGNOSIS — B20 Human immunodeficiency virus [HIV] disease: Secondary | ICD-10-CM

## 2023-05-10 DIAGNOSIS — E1129 Type 2 diabetes mellitus with other diabetic kidney complication: Secondary | ICD-10-CM | POA: Diagnosis not present

## 2023-05-10 DIAGNOSIS — Z23 Encounter for immunization: Secondary | ICD-10-CM | POA: Diagnosis not present

## 2023-05-10 DIAGNOSIS — M62838 Other muscle spasm: Secondary | ICD-10-CM | POA: Diagnosis not present

## 2023-05-10 DIAGNOSIS — Z993 Dependence on wheelchair: Secondary | ICD-10-CM

## 2023-05-10 DIAGNOSIS — E669 Obesity, unspecified: Secondary | ICD-10-CM

## 2023-05-10 NOTE — Telephone Encounter (Signed)
Call from patient states was here for appointment today.  Would like to get order for [employer].  Patient states forgot to ask about today.  Patient was informed that she may have to return for another appointment as this was not talked about at her visit today.   Will send message to doctor.

## 2023-05-10 NOTE — Progress Notes (Unsigned)
CC: Hospital Follow Up   HPI:  Kathy Frank is a 47 y.o. 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  Please see assessment and plan below for further details.  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   Anemia, posthemorrhagic, acute 10/14/2022   Anxiety    CAD (coronary artery disease)    CAP (community acquired pneumonia) 09/29/2022   CVA (cerebral vascular accident) [facility])    Depression 06/28/2006   Qualifier: Diagnosis of  By: Laveda Abbe MD, Todd     Diabetes mellitus type 2 in obese 06/28/1994   Dyspnea    uses oxygeb 2 liters per minute at dialysis   End stage renal disease on dialysis [facility])    on hemodialysis T/Th/Sat   Erosive esophagitis    Esophageal reflux    Eye redness    Fecal incontinence 02/04/2018   Gastroparesis    ? diabetic   GERD (gastroesophageal reflux disease)    Human immunodeficiency virus (HIV) disease (HCC) 04/23/2016   Hyperkalemia 10/12/2022   Hyperlipidemia    Hypertension    Hypertensive emergency 11/09/2021   Hypertensive urgency 05/20/2021   Metabolic bone disease 05/02/2019   Moderate nonproliferative diabetic retinopathy of both eyes (HCC) 11/21/2014   11/14/14: Noted on retinal imaging; needs follow-up imaging in 6 months  05/22/16: Noted on retinal imaging again; needs follow-up imaging in 6 months   PAD (peripheral artery disease) (HCC)    Type 2 diabetes mellitus with diabetic peripheral angiopathy without gangrene (HCC) 05/01/2019   Wound infection s/p L transmetatarsal amputation     Current Outpatient Medications   Medication Instructions   Accu-Chek Softclix Lancets lancets Use as instructed   albuterol (VENTOLIN HFA) 108 (90 Base) MCG/ACT inhaler 2 puffs, Inhalation, Every 4 hours PRN   amLODipine-olmesartan (AZOR) 10-40 MG tablet 1 tablet, Oral, Every evening   aspirin EC (EQ ASPIRIN ADULT LOW DOSE) 81 mg, Oral, Daily, Swallow whole.   atorvastatin (LIPITOR) 80 mg, Oral, Daily   bictegravir-emtricitabine-tenofovir AF (BIKTARVY) 50-200-25 MG TABS tablet 1 tablet, Oral, Daily, OVERDUE FOR OFFICE FOLLOW UP - PLEASE CALL OUR OFFICE TO SCHEDULE [phone]   busPIRone (BUSPAR) 2.5 mg, Oral, 2 times daily   calcitRIOL (ROCALTROL) 1.25 mcg, Oral, Every T-Th-Sa (Hemodialysis)   calcium acetate (PHOSLO) 1,334 mg, Oral, 3 times daily with meals   carvedilol (COREG) 12.5 mg, Oral, 2 times daily with meals   clopidogrel (PLAVIX) 75 mg, Oral, Daily   Continuous Blood Gluc Sensor (DEXCOM G7 SENSOR) MISC Use to check blood sugar continuously   cyclobenzaprine (FLEXERIL) 5 mg, Oral, 2 times daily PRN   Darbepoetin Alfa (ARANESP) 200 mcg, Subcutaneous, Every Mon (1800)   escitalopram (LEXAPRO) 5 mg, Oral, Daily   glucose blood test strip Use as instructed   hydrocortisone-pramoxine (PROCTOFOAM-HC) rectal foam 1 applicator, Rectal, 2 times daily   Insulin Pen Needle (PENTIPS) 32G X 4 MM MISC Use as directed   Lantus SoloStar 15 Units, Subcutaneous, Daily at bedtime   lidocaine (LIDODERM) 5 % 1 patch, Transdermal, Every 24 hours, Remove & Discard patch within 12 hours or as directed by MD   multivitamin (RENA-VIT) TABS  tablet 1 tablet, Oral, Daily   mupirocin ointment (BACTROBAN) 2 % Topical, 3 times daily   Nystatin (GERHARDT'S BUTT CREAM) CREA 1 Application, Topical, 2 times daily   pantoprazole (PROTONIX) 40 mg, Oral, 2 times daily   psyllium (HYDROCIL/METAMUCIL) 95 % PACK 1 packet, Oral, Daily   sertraline (ZOLOFT) 25 mg, Oral, Daily   Ubrelvy 50 mg, Oral, As needed, if needed a second dose may be taken at  least 2 hours after the initial dose; MAX 8 doses in a month   Victoza 0.6 mg, Subcutaneous, Daily     Review of Systems:   Pertinent items noted in HPI and/or A&P.  Physical Exam:  Vitals:   05/10/23 0955 05/10/23 1107  BP: (!) 175/99 (!) 179/57  Pulse: 89 89  Temp: 98.4 F (36.9 C)   TempSrc: Oral   SpO2: 100%   Height: 5' (1.524 m)     Constitutional: Chronically ill-appearing female, appears older than stated age. In no acute distress. HEENT: Normocephalic, atraumatic, Sclera non-icteric, PERRL, EOM intact Cardio:Regular rate and rhythm. 2+ bilateral radial pulses.  Palpable thrill over left upper extremity AV fistula Pulm:Clear to auscultation bilaterally. Normal work of breathing on room air.. MSK: Stable bilateral BKA. Skin:Warm and dry. Neuro:Alert and oriented x3. No focal deficit noted. Psych:Pleasant mood and affect.   Assessment & Plan:   Essential hypertension Blood pressure elevated today at 175/99 and 179/57 on repeat.  After recent hospitalization she was started on carvedilol 12.5 mg BID and she continues on amlodipine/olmesartan 10/40 mg daily.  Since she is on dialysis I will send a message to her nephrologist, Dr. Marisue Humble, about her blood pressure readings here to help with any medication titration. - Continue carvedilol 12.5 mg twice daily and amlodipine/olmesartan 10/40 mg daily  Type 2 diabetes mellitus (HCC) A1c 6.1 on 03/02/2023. She is currently on Lantus 15 units daily and Victoza 0.6 mg daily.  She was fitted with a Dexcom 7 during this visit and will follow-up on 05/27/2023 to review the data.  She does have an eye doctor and we will set up a visit to stay up-to-date on her eye exams. - Continue Lantus 15 units daily and Victoza 0.6 mg daily - Return in 2 weeks for CGM review and repeat A1c  ESRD on dialysis [facility]) Recently admitted from 04/26/2023 - 04/29/2023 for severe uremia after truncated and missed dialysis sessions that presented with signs  of a stroke including left homonymous hemianopsia and left facial droop that all resolved.  She was started on DAPT for 3 weeks and then aspirin indefinitely after this.  She continues to go to dialysis Tuesdays, Thursdays, and Saturdays and follows with Dr. Marisue Humble.  She is finishing all of her dialysis sessions and hitting her dry weight more consistently without significant issues.  Muscle spasm She continues to have stable but at times bothersome muscle spasms primarily in her left leg.  Flexeril has been working well for her at the current dose of 5 mg.  However she only takes this at night due to the medication making her sleepy.  With her recent concern for seizure and Todd's paralysis after missing dialysis she would not be a good candidate for Robaxin, tizanidine has a similar sedation side effect is Flexeril, and metaxalone is contraindicated in dialysis. - Continue Flexeril 5 mg twice daily as needed  Human immunodeficiency virus (HIV) disease (HCC) Continues on Biktarvy and CD4 count was 313 last month with an undetectable HIV RNA.  She will follow-up  with infectious disease on 05/14/2023. - Continue Biktarvy and ID follow-up  Dependence on wheelchair This patient is a bilateral BKA amputee and is reliant on a wheelchair.  She has limitations at home in her current nonpowered wheelchair including reliably getting around the house to get to the bathroom, preparing food, and cleaning.  She would not benefit from a cane or walker due to being a bilateral lower extremity amputee without prosthesis.  Due to her chronic conditions including end-stage kidney disease, type 2 diabetes, HIV, protein calorie malnutrition, and generalized deconditioning and debility associated with these conditions she is not able to reliably self propel an optimally configured manual wheelchair in the home to perform her mobility related activities of daily living during a typical day.  These reasons are also applicable  to her need for a powered wheelchair and not a scooter due to her unreliable ability to maintain postural stability.  She does have the mental and physical abilities to operate a powered wheelchair.  She can safely transfer on and off her current wheelchair and with [place] to be able to do it in a powered wheelchair as well.  As above having her power wheelchair will improve the patient's ability to participate in her mobility related activities of daily living in comparison to a nonpowered wheelchair.  She would be able to use her strength and energy to complete the task that she would be otherwise using the energy to transport herself.  She also has significant pain and muscle spasms in her lower extremity, primarily left, that also limit her ability to use a nonpowered wheelchair.  Patient is a fall and was evaluated by physical therapy on 01/25/2023.  Below is the clinical impression from the physical therapist during the visit.  Patient is a 47 y.o. female who was seen today for physical therapy evaluation and treatment for wheelchair evaluation. Patient has history of L AKA and R BKA. Patient reports of not wearing her L prosthetic leg due to it being uncomfortable her decreased confidence with balance with that leg on. Patient has not worn L prosthetic leg for > 1 year. Patient reports of pain with prolonged wear of R prosthetic leg and has not worn it for >1 month. Patient has not stood or walked with her prosthesis for >1 year. Patient is currently using a manual wheelchair but has difficulty with prolonged propulsion due to decreased endurance in UE. Patient also is on dialysis 3 days a week and reports significant weakness and fatigue post dialysis treatment where she is unable to propel her wheelchair to carry on independent activities at home. Patient was able to propel wheelchair at speed of 0.32 m/s with use of her bil UE, which is significantly below comfortable propulsion speed of 1.50 to 1.65 m/s  Caralee Ates et al, 2022). Patient will benefit from group 2 power chair to improve her mobility with power wheelchair within her home, accommodate her fatigue during dialysis and improve her functional independence.     Patient discussed with Dr. Duwayne Heck, DO [facility] Internal Medicine Resident PGY-2 Clinic Phone: [phone] Pager: [phone]

## 2023-05-10 NOTE — Patient Instructions (Addendum)
Thank you for your visit today!  Your Dexcom G7 will give you a new blood sugar every 5 minutes of every day.   On the bottom of the screen you will see a report that tells you how your blood sugars are doing overall. The more green the better. We suggest no red(low blood sugar)  if you can help it.  Please follow up in 2 weeks - appointment is already scheduled. Brig your receiver and another sensor if you have not already put another one on at day 10 or 11  Adjust the alerts/alarms as needed.   Call with questions or concerns:  Lupita Leash [phone]

## 2023-05-10 NOTE — Patient Instructions (Signed)
  Thank you, Ms.[name], for allowing [place] to provide your care today.  I am glad that you are doing well since leaving the hospital and that you have had little issues with dialysis.  I hope that you continue to do well and that your transportation on the weekends becomes more reliable.  For your high blood pressure I will send a message to your nephrologist and they will decide if they want to change your medications.  For your leg cramps there are not any other good options for muscle relaxers at this point but I recommend that you try some topical therapies like Bengay if you do not want to take the Flexeril during the day.  Please let us know if you have any questions or any new or worsening issues we will plan to see you in about 3 months.  Follow up: 3 months    Remember:     Should you have any questions or concerns please call the internal medicine clinic at [phone].     Rocky Morel, DO [facility]

## 2023-05-10 NOTE — Progress Notes (Signed)
BP Readings from Last 3 Encounters:  05/10/23 (!) 175/99  04/29/23 128/67  04/21/23 (!) 160/99   Wt Readings from Last 10 Encounters:  04/29/23 (S) 135 lb 5.8 oz (61.4 kg)  04/21/23 160 lb (72.6 kg)  04/14/23 147 lb (66.7 kg)  02/25/23 147 lb 11.3 oz (67 kg)  02/22/23 147 lb 11.3 oz (67 kg)  01/08/23 177 lb (80.3 kg)  12/31/22 177 lb 0.5 oz (80.3 kg)  12/25/22 177 lb 0.5 oz (80.3 kg)  10/19/22 171 lb 8.3 oz (77.8 kg)  10/15/22 171 lb 8.3 oz (77.8 kg)   Lab Results  Component Value Date   HGBA1C 6.1 (A) 03/02/2023   HGBA1C 6.5 (A) 10/19/2022   HGBA1C 6.3 (H) 06/27/2022   HGBA1C 8.0 (H) 11/10/2021   HGBA1C 7.1 (H) 04/18/2021    1030-1100  Diabetes Self-Management Education  Visit Type: Annual Follow-Up  Appt. Start Time: 1030 Appt. End Time: 1100  05/10/2023  [name], identified by name and date of birth, is a 47 y.o. female with a diagnosis of Diabetes:  .   ASSESSMENT  Last menstrual period 03/24/2023. There is no height or weight on file to calculate BMI.   Diabetes Self-Management Education - 05/10/23 1000       Visit Information   Visit Type Annual Follow-Up      Psychosocial Assessment   Patient Belief/Attitude about Diabetes Motivated to manage diabetes    What is the hardest part about your diabetes right now, causing you the most concern, or is the most worrisome to you about your diabetes?   Checking blood sugar    Self-care barriers Debilitated state due to current medical condition;Unsteady gait/risk for falls;Lack of material resources    Self-management support Family   daughters nearby, mother and brother in [place]   Patient Concerns Monitoring    Special Needs Large print    Preferred Learning Style No preference indicated    Learning Readiness Ready      Pre-Education Assessment   Patient understands monitoring blood glucose, interpreting and using results Needs Instruction    Patient understands prevention, detection, and treatment  of acute complications. Needs Review      Complications   Last HgB A1C per patient/outside source 6.1 %    How often do you check your blood sugar? --   deferred this for today   Fasting Blood glucose range (mg/dL) 95-284;132-440    Postprandial Blood glucose range (mg/dL) 102-725;366-440    Number of hypoglycemic episodes per month 0    Number of hyperglycemic episodes ( >200mg /dL): Rare    Can you tell when your blood sugar is high? --   deferred this for today   Have you had a dilated eye exam in the past 12 months? No    Have you had a dental exam in the past 12 months? No    Are you checking your feet? N/A      Dietary Intake   Breakfast deferred today due to CGM was focus and leg pain      Activity / Exercise   Activity / Exercise Type ADL's   wheelchair bound     Patient Education   Previous Diabetes Education Yes (please comment)   here and in hospital   Monitoring Taught/evaluated CGM (comment)    Acute complications Taught prevention, symptoms, and  treatment of hypoglycemia - the 15 rule.      Individualized Goals (developed by patient)   Monitoring  Consistenly use CGM  Post-Education Assessment   Patient understands monitoring blood glucose, interpreting and using results Comprehends key points    Patient understands prevention, detection, and treatment of acute complications. Comprehends key points      Outcomes   Expected Outcomes Demonstrated interest in learning but significant barriers to change    Future DMSE 2 wks    Program Status Not Completed      Subsequent Visit   Since your last visit have you continued or begun to take your medications as prescribed? Yes    Since your last visit have you had your blood pressure checked? Yes    Is your most recent blood pressure lower, unchanged, or higher since your last visit? Higher    Since your last visit have you experienced any weight changes? Loss    Weight Loss (lbs) 12    Since your last visit, are  you checking your blood glucose at least once a day? Yes             Individualized Plan for Diabetes Self-Management Training:   Learning Objective:  Patient will have a greater understanding of diabetes self-management. Patient education plan is to attend individual and/or group sessions per assessed needs and concerns.   Plan:   Patient Instructions  Thank you for your visit today!  Your Dexcom G7 will give you a new blood sugar every 5 minutes of every day.   On the bottom of the screen you will see a report that tells you how your blood sugars are doing overall. The more green the better. We suggest no red(low blood sugar)  if you can help it.  Please follow up in 2 weeks - appointment is already scheduled. Brig your receiver and another sensor if you have not already put another one on at day 10 or 11  Adjust the alerts/alarms as needed.   Call with questions or concerns:  Lupita Leash [phone]   Expected Outcomes:  Demonstrated interest in learning but significant barriers to change  Education material provided: Diabetes Resources  If problems or questions, patient to contact team via:  Phone  Future DSME appointment: 2 wks Norm Parcel, RD 05/10/2023 11:14 AM.

## 2023-05-11 ENCOUNTER — Encounter (HOSPITAL_COMMUNITY): Payer: Self-pay

## 2023-05-11 ENCOUNTER — Telehealth: Payer: Self-pay

## 2023-05-11 DIAGNOSIS — Z992 Dependence on renal dialysis: Secondary | ICD-10-CM | POA: Diagnosis not present

## 2023-05-11 DIAGNOSIS — L299 Pruritus, unspecified: Secondary | ICD-10-CM | POA: Diagnosis not present

## 2023-05-11 DIAGNOSIS — T8249XA Other complication of vascular dialysis catheter, initial encounter: Secondary | ICD-10-CM | POA: Diagnosis not present

## 2023-05-11 DIAGNOSIS — R197 Diarrhea, unspecified: Secondary | ICD-10-CM | POA: Diagnosis not present

## 2023-05-11 DIAGNOSIS — N186 End stage renal disease: Secondary | ICD-10-CM | POA: Diagnosis not present

## 2023-05-11 DIAGNOSIS — D689 Coagulation defect, unspecified: Secondary | ICD-10-CM | POA: Diagnosis not present

## 2023-05-11 DIAGNOSIS — D631 Anemia in chronic kidney disease: Secondary | ICD-10-CM | POA: Diagnosis not present

## 2023-05-11 DIAGNOSIS — N2581 Secondary hyperparathyroidism of renal origin: Secondary | ICD-10-CM | POA: Diagnosis not present

## 2023-05-11 DIAGNOSIS — E1110 Type 2 diabetes mellitus with ketoacidosis without coma: Secondary | ICD-10-CM | POA: Diagnosis not present

## 2023-05-11 DIAGNOSIS — R519 Headache, unspecified: Secondary | ICD-10-CM | POA: Diagnosis not present

## 2023-05-11 NOTE — Telephone Encounter (Signed)
RTC from [facility] would like to do PT for patient 1 time for 1 week. Week 3 1 time and 1 time every 3 weeks.  For a total of 3 weeks. Patient is planning a move to an accessible apartment and PT will need to go out to help her adapt as needed.  Bilateral above knee amputations, Weakness and Mobility.  Verbal ok given will forward to PCP.

## 2023-05-11 NOTE — Telephone Encounter (Signed)
Kathy Frank with [facility] PT requesting VO. Please call back @ [phone].

## 2023-05-11 NOTE — Telephone Encounter (Signed)
RTC to [facility] left that Clinics had returned her call.

## 2023-05-12 NOTE — Telephone Encounter (Signed)
Pt is requesting a call back she is stating someone was to call her back about her wheelchair  yesterday

## 2023-05-13 ENCOUNTER — Encounter: Payer: Self-pay | Admitting: Student

## 2023-05-13 DIAGNOSIS — E1110 Type 2 diabetes mellitus with ketoacidosis without coma: Secondary | ICD-10-CM | POA: Diagnosis not present

## 2023-05-13 DIAGNOSIS — R197 Diarrhea, unspecified: Secondary | ICD-10-CM | POA: Diagnosis not present

## 2023-05-13 DIAGNOSIS — T8249XA Other complication of vascular dialysis catheter, initial encounter: Secondary | ICD-10-CM | POA: Diagnosis not present

## 2023-05-13 DIAGNOSIS — R519 Headache, unspecified: Secondary | ICD-10-CM | POA: Diagnosis not present

## 2023-05-13 DIAGNOSIS — L299 Pruritus, unspecified: Secondary | ICD-10-CM | POA: Diagnosis not present

## 2023-05-13 DIAGNOSIS — N186 End stage renal disease: Secondary | ICD-10-CM | POA: Diagnosis not present

## 2023-05-13 DIAGNOSIS — N2581 Secondary hyperparathyroidism of renal origin: Secondary | ICD-10-CM | POA: Diagnosis not present

## 2023-05-13 DIAGNOSIS — D631 Anemia in chronic kidney disease: Secondary | ICD-10-CM | POA: Diagnosis not present

## 2023-05-13 DIAGNOSIS — D689 Coagulation defect, unspecified: Secondary | ICD-10-CM | POA: Diagnosis not present

## 2023-05-13 DIAGNOSIS — Z992 Dependence on renal dialysis: Secondary | ICD-10-CM | POA: Diagnosis not present

## 2023-05-13 NOTE — Telephone Encounter (Signed)
Called pt - no answer; left message to call the office about her w/c.

## 2023-05-13 NOTE — Assessment & Plan Note (Signed)
She continues to have stable but at times bothersome muscle spasms primarily in her left leg.  Flexeril has been working well for her at the current dose of 5 mg.  However she only takes this at night due to the medication making her sleepy.  With her recent concern for seizure and Todd's paralysis after missing dialysis she would not be a good candidate for Robaxin, tizanidine has a similar sedation side effect is Flexeril, and metaxalone is contraindicated in dialysis. - Continue Flexeril 5 mg twice daily as needed

## 2023-05-13 NOTE — Assessment & Plan Note (Signed)
Recently admitted from 04/26/2023 - 04/29/2023 for severe uremia after truncated and missed dialysis sessions that presented with signs of a stroke including left homonymous hemianopsia and left facial droop that all resolved.  She was started on DAPT for 3 weeks and then aspirin indefinitely after this.  She continues to go to dialysis Tuesdays, Thursdays, and Saturdays and follows with Dr. Marisue Humble.  She is finishing all of her dialysis sessions and hitting her dry weight more consistently without significant issues.

## 2023-05-13 NOTE — Assessment & Plan Note (Signed)
Blood pressure elevated today at 175/99 and 179/57 on repeat.  After recent hospitalization she was started on carvedilol 12.5 mg BID and she continues on amlodipine/olmesartan 10/40 mg daily.  Since she is on dialysis I will send a message to her nephrologist, Dr. Marisue Humble, about her blood pressure readings here to help with any medication titration. - Continue carvedilol 12.5 mg twice daily and amlodipine/olmesartan 10/40 mg daily

## 2023-05-13 NOTE — Assessment & Plan Note (Addendum)
A1c 6.1 on 03/02/2023. She is currently on Lantus 15 units daily and Victoza 0.6 mg daily.  She was fitted with a Dexcom 7 during this visit and will follow-up on 05/27/2023 to review the data.  She does have an eye doctor and we will set up a visit to stay up-to-date on her eye exams. - Continue Lantus 15 units daily and Victoza 0.6 mg daily - Return in 2 weeks for CGM review and repeat A1c

## 2023-05-13 NOTE — Assessment & Plan Note (Signed)
Continues on Biktarvy and CD4 count was 313 last month with an undetectable HIV RNA.  She will follow-up with infectious disease on 05/14/2023. - Continue Biktarvy and ID follow-up

## 2023-05-13 NOTE — Assessment & Plan Note (Signed)
This patient is a bilateral BKA amputee and is reliant on a wheelchair.  She has limitations at home in her current nonpowered wheelchair including reliably getting around the house to get to the bathroom, preparing food, and cleaning.  She would not benefit from a cane or walker due to being a bilateral lower extremity amputee without prosthesis.  Due to her chronic conditions including end-stage kidney disease, type 2 diabetes, HIV, protein calorie malnutrition, and generalized deconditioning and debility associated with these conditions she is not able to reliably self propel an optimally configured manual wheelchair in the home to perform her mobility related activities of daily living during a typical day.  These reasons are also applicable to her need for a powered wheelchair and not a scooter due to her unreliable ability to maintain postural stability.  She does have the mental and physical abilities to operate a powered wheelchair.  She can safely transfer on and off her current wheelchair and with [place] to be able to do it in a powered wheelchair as well.  As above having her power wheelchair will improve the patient's ability to participate in her mobility related activities of daily living in comparison to a nonpowered wheelchair.  She would be able to use her strength and energy to complete the task that she would be otherwise using the energy to transport herself.  She also has significant pain and muscle spasms in her lower extremity, primarily left, that also limit her ability to use a nonpowered wheelchair.  Patient is a fall and was evaluated by physical therapy on 01/25/2023.  Below is the clinical impression from the physical therapist during the visit.  Patient is a 47 y.o. female who was seen today for physical therapy evaluation and treatment for wheelchair evaluation. Patient has history of L AKA and R BKA. Patient reports of not wearing her L prosthetic leg due to it being uncomfortable her  decreased confidence with balance with that leg on. Patient has not worn L prosthetic leg for > 1 year. Patient reports of pain with prolonged wear of R prosthetic leg and has not worn it for >1 month. Patient has not stood or walked with her prosthesis for >1 year. Patient is currently using a manual wheelchair but has difficulty with prolonged propulsion due to decreased endurance in UE. Patient also is on dialysis 3 days a week and reports significant weakness and fatigue post dialysis treatment where she is unable to propel her wheelchair to carry on independent activities at home. Patient was able to propel wheelchair at speed of 0.32 m/s with use of her bil UE, which is significantly below comfortable propulsion speed of 1.50 to 1.65 m/s Caralee Ates et al, 2022). Patient will benefit from group 2 power chair to improve her mobility with power wheelchair within her home, accommodate her fatigue during dialysis and improve her functional independence.

## 2023-05-14 ENCOUNTER — Ambulatory Visit: Payer: 59 | Admitting: Infectious Diseases

## 2023-05-14 NOTE — Progress Notes (Signed)
Internal Medicine Clinic Attending  Case discussed with the resident at the time of the visit.  We reviewed the resident's history and exam and pertinent patient test results.  I agree with the assessment, diagnosis, and plan of care documented in the resident's note.  

## 2023-05-15 DIAGNOSIS — N186 End stage renal disease: Secondary | ICD-10-CM | POA: Diagnosis not present

## 2023-05-15 DIAGNOSIS — D689 Coagulation defect, unspecified: Secondary | ICD-10-CM | POA: Diagnosis not present

## 2023-05-15 DIAGNOSIS — L299 Pruritus, unspecified: Secondary | ICD-10-CM | POA: Diagnosis not present

## 2023-05-15 DIAGNOSIS — R519 Headache, unspecified: Secondary | ICD-10-CM | POA: Diagnosis not present

## 2023-05-15 DIAGNOSIS — N2581 Secondary hyperparathyroidism of renal origin: Secondary | ICD-10-CM | POA: Diagnosis not present

## 2023-05-15 DIAGNOSIS — D631 Anemia in chronic kidney disease: Secondary | ICD-10-CM | POA: Diagnosis not present

## 2023-05-15 DIAGNOSIS — Z992 Dependence on renal dialysis: Secondary | ICD-10-CM | POA: Diagnosis not present

## 2023-05-15 DIAGNOSIS — E1110 Type 2 diabetes mellitus with ketoacidosis without coma: Secondary | ICD-10-CM | POA: Diagnosis not present

## 2023-05-15 DIAGNOSIS — R197 Diarrhea, unspecified: Secondary | ICD-10-CM | POA: Diagnosis not present

## 2023-05-15 DIAGNOSIS — T8249XA Other complication of vascular dialysis catheter, initial encounter: Secondary | ICD-10-CM | POA: Diagnosis not present

## 2023-05-18 ENCOUNTER — Encounter (HOSPITAL_COMMUNITY): Payer: Self-pay | Admitting: Emergency Medicine

## 2023-05-18 ENCOUNTER — Emergency Department (HOSPITAL_COMMUNITY)
Admission: EM | Admit: 2023-05-18 | Discharge: 2023-05-18 | Discharge status: Against Medical Advice | Payer: 59 | Attending: Emergency Medicine | Admitting: Emergency Medicine

## 2023-05-18 ENCOUNTER — Other Ambulatory Visit: Payer: Self-pay

## 2023-05-18 DIAGNOSIS — T8249XA Other complication of vascular dialysis catheter, initial encounter: Secondary | ICD-10-CM | POA: Diagnosis not present

## 2023-05-18 DIAGNOSIS — R110 Nausea: Secondary | ICD-10-CM | POA: Diagnosis not present

## 2023-05-18 DIAGNOSIS — L299 Pruritus, unspecified: Secondary | ICD-10-CM | POA: Diagnosis not present

## 2023-05-18 DIAGNOSIS — Z5321 Procedure and treatment not carried out due to patient leaving prior to being seen by health care provider: Secondary | ICD-10-CM | POA: Diagnosis not present

## 2023-05-18 DIAGNOSIS — N186 End stage renal disease: Secondary | ICD-10-CM | POA: Diagnosis not present

## 2023-05-18 DIAGNOSIS — R519 Headache, unspecified: Secondary | ICD-10-CM | POA: Diagnosis not present

## 2023-05-18 DIAGNOSIS — D689 Coagulation defect, unspecified: Secondary | ICD-10-CM | POA: Diagnosis not present

## 2023-05-18 DIAGNOSIS — D631 Anemia in chronic kidney disease: Secondary | ICD-10-CM | POA: Diagnosis not present

## 2023-05-18 DIAGNOSIS — M79601 Pain in right arm: Secondary | ICD-10-CM | POA: Insufficient documentation

## 2023-05-18 DIAGNOSIS — N2581 Secondary hyperparathyroidism of renal origin: Secondary | ICD-10-CM | POA: Diagnosis not present

## 2023-05-18 DIAGNOSIS — Z992 Dependence on renal dialysis: Secondary | ICD-10-CM | POA: Diagnosis not present

## 2023-05-18 DIAGNOSIS — R197 Diarrhea, unspecified: Secondary | ICD-10-CM | POA: Diagnosis not present

## 2023-05-18 DIAGNOSIS — R1111 Vomiting without nausea: Secondary | ICD-10-CM | POA: Diagnosis not present

## 2023-05-18 DIAGNOSIS — E1110 Type 2 diabetes mellitus with ketoacidosis without coma: Secondary | ICD-10-CM | POA: Diagnosis not present

## 2023-05-18 NOTE — ED Notes (Signed)
Unable to find pt for vital signs update.

## 2023-05-18 NOTE — ED Notes (Signed)
Unable to find pt for up date vital signs.

## 2023-05-18 NOTE — ED Notes (Signed)
Called patient 3x for room- no answer

## 2023-05-18 NOTE — ED Triage Notes (Signed)
Per [facility] pt coming from dialysis- started having nausea after treatment. One episode at dialysis and then more since arrival. C/o pain to right arm. States she did not eat before dialysis. Patient is bilateral leg amputee.

## 2023-05-18 NOTE — ED Notes (Signed)
Pt requested to use the bathroom after being wheeled to one. Pt states that she will not use the bathroom until she is roomed. Pt was informed that not all ED rooms have bathrooms in them, pt insisted on waiting.

## 2023-05-19 ENCOUNTER — Ambulatory Visit: Payer: 59 | Admitting: Nurse Practitioner

## 2023-05-20 DIAGNOSIS — T8249XA Other complication of vascular dialysis catheter, initial encounter: Secondary | ICD-10-CM | POA: Diagnosis not present

## 2023-05-20 DIAGNOSIS — N186 End stage renal disease: Secondary | ICD-10-CM | POA: Diagnosis not present

## 2023-05-20 DIAGNOSIS — D631 Anemia in chronic kidney disease: Secondary | ICD-10-CM | POA: Diagnosis not present

## 2023-05-20 DIAGNOSIS — D689 Coagulation defect, unspecified: Secondary | ICD-10-CM | POA: Diagnosis not present

## 2023-05-20 DIAGNOSIS — N2581 Secondary hyperparathyroidism of renal origin: Secondary | ICD-10-CM | POA: Diagnosis not present

## 2023-05-20 DIAGNOSIS — Z992 Dependence on renal dialysis: Secondary | ICD-10-CM | POA: Diagnosis not present

## 2023-05-20 DIAGNOSIS — R519 Headache, unspecified: Secondary | ICD-10-CM | POA: Diagnosis not present

## 2023-05-20 DIAGNOSIS — E1110 Type 2 diabetes mellitus with ketoacidosis without coma: Secondary | ICD-10-CM | POA: Diagnosis not present

## 2023-05-20 DIAGNOSIS — R197 Diarrhea, unspecified: Secondary | ICD-10-CM | POA: Diagnosis not present

## 2023-05-20 DIAGNOSIS — L299 Pruritus, unspecified: Secondary | ICD-10-CM | POA: Diagnosis not present

## 2023-05-21 ENCOUNTER — Encounter: Payer: Self-pay | Admitting: Infectious Diseases

## 2023-05-21 ENCOUNTER — Ambulatory Visit: Payer: 59 | Admitting: Infectious Diseases

## 2023-05-21 ENCOUNTER — Ambulatory Visit: Payer: Self-pay

## 2023-05-21 ENCOUNTER — Other Ambulatory Visit (HOSPITAL_COMMUNITY): Payer: Self-pay

## 2023-05-21 ENCOUNTER — Other Ambulatory Visit: Payer: Self-pay

## 2023-05-21 VITALS — BP 151/77 | HR 83 | Resp 16 | Ht 60.0 in | Wt 135.0 lb

## 2023-05-21 DIAGNOSIS — Z23 Encounter for immunization: Secondary | ICD-10-CM | POA: Insufficient documentation

## 2023-05-21 DIAGNOSIS — F99 Mental disorder, not otherwise specified: Secondary | ICD-10-CM | POA: Insufficient documentation

## 2023-05-21 DIAGNOSIS — Z0000 Encounter for general adult medical examination without abnormal findings: Secondary | ICD-10-CM | POA: Insufficient documentation

## 2023-05-21 DIAGNOSIS — B20 Human immunodeficiency virus [HIV] disease: Secondary | ICD-10-CM

## 2023-05-21 DIAGNOSIS — Z5181 Encounter for therapeutic drug level monitoring: Secondary | ICD-10-CM | POA: Insufficient documentation

## 2023-05-21 MED ORDER — BICTEGRAVIR-EMTRICITAB-TENOFOV 50-200-25 MG PO TABS
1.0000 | ORAL_TABLET | Freq: Every day | ORAL | 5 refills | Status: DC
  Filled 2023-05-25 (×2): qty 30, 30d supply, fill #0
  Filled 2023-06-30: qty 30, 30d supply, fill #1
  Filled 2023-08-05: qty 30, 30d supply, fill #2
  Filled 2023-08-30: qty 30, 30d supply, fill #3
  Filled 2023-09-29: qty 30, 30d supply, fill #4
  Filled 2023-10-25: qty 30, 30d supply, fill #5

## 2023-05-21 MED ORDER — BIKTARVY 50-200-25 MG PO TABS
1.0000 | ORAL_TABLET | Freq: Every day | ORAL | 11 refills | Status: DC
Start: 2023-05-21 — End: 2023-05-21

## 2023-05-21 NOTE — Patient Outreach (Signed)
Care Coordination   Follow Up Visit Note   05/21/2023 Name: ARI BEITER MRN: [id number] DOB: [birth year]  Gerlene Fee Federle is a 47 y.o. year old female who sees Tawkaliyar, Roya, DO for primary care. I spoke with  [name] by phone today.  What matters to the patients health and wellness today?  During a recent conversation, Mrs. Ridinger expressed her desire to continue our discussion at a later time, to which I agreed. Subsequently, I spoke with Champ Mungo, who relayed her recent move to a two-bedroom, two-bathroom apartment designed for individuals with disabilities. 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 Champ Mungo also disclosed persistent high blood pressure for which she is currently prescribed amlodipine/olmesartan 10-40 mg added to the carvedilol 12.5 mg. Additionally, she is experiencing distressing muscle spasms and is currently taking Flexeril 5 mg. Despite consulting her physician on the matter, other medications are restricted due to her renal condition. Consequently, Champ Mungo is contemplating requesting an adjustment in the medication dosage to 10 mg,to  help with the muscle discomfort.       Goals Addressed             This Visit's Progress    CCM Expected Outcome:  Monitor, Self-Manage, and Reduce Symptoms of Hypertension       Patient Goals/Self Care Activities: -Patient/Caregiver will take medications as prescribed   -Patient/Caregiver will attend all scheduled provider appointments -Patient/Caregiver will call pharmacy for medication refills 3-7 days in advance of running out of medications -Patient/Caregiver will call provider office for new concerns or questions  -Patient/Caregiver will focus on medication adherence by taking medications as prescribed   Last practice recorded BP readings:  BP Readings from Last 3 Encounters:  05/21/23 (!) 151/77   05/18/23 (!) 207/95  05/10/23 (!) 179/57   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 treatment plan related to hypertension self management and patient's adherence to plan as established by provider Provided education to patient re: stroke prevention, s/s of heart attack and stroke Reviewed medications with patient and discussed importance of compliance Discussed plans with patient for ongoing care management follow up and provided patient with direct contact information for care management team Discussed complications of poorly controlled blood pressure such as heart disease, stroke, circulatory complications, vision complications, kidney impairment, sexual dysfunction      COMPLETED: Hopital f/u for above the knee right upper extremty       Care Coordination Interventions: 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 and patient's adherence to plan as established by provider Advised patient to follow protocol established by the physician Reviewed medications with patient and discussed pain medication and advised her to talk with her PCP about the medication and what to do Reviewed scheduled/upcoming provider appointments including [facility] on 03/02/23 -Patient/Caregiver will self-administer medications as prescribed as evidenced by self-report/primary caregiver report  -Patient/Caregiver will attend all scheduled provider appointments as evidenced by clinician review of documented attendance to scheduled appointments and patient/caregiver report -Patient/Caregiver will call pharmacy for medication refills as evidenced by patient report and review of pharmacy fill history as appropriate -Patient/Caregiver will call provider office for new concerns or questions as evidenced by review of documented incoming telephone call notes and patient report  Continue with physical therapy to get stronger           SDOH assessments and interventions completed:  No     Care Coordination Interventions:  Yes,  provided   Interventions Today    Flowsheet Row Most Recent Value  Chronic Disease   Chronic disease during today's visit Hypertension (HTN)  General Interventions   General Interventions Discussed/Reviewed General Interventions Discussed  Nutrition Interventions   Nutrition Discussed/Reviewed Nutrition Discussed  Pharmacy Interventions   Pharmacy Dicussed/Reviewed Pharmacy Topics Discussed  Safety Interventions   Safety Discussed/Reviewed Safety Discussed        Follow up plan: Follow up call scheduled for 06/24/23  130 pm    Encounter Outcome:  Patient Visit Completed   Juanell Fairly RN, BSN, [place] [facility]   Care Coordinator Phone: [phone]

## 2023-05-21 NOTE — Progress Notes (Addendum)
[address], [place], [place], [address]                                                                  Phn. [phone]; Fax: [phone]                                                                             Date: 05/21/23  Reason for Visit: Routine HIV care.  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 Patient previously followed by Dr Orvan Falconer who is retired now   Last seen on 08/26/22. Well controlled on Biktarvy Lab Results  Component Value Date   HIV1RNAQUANT <20 04/29/2023   Lab Results  Component Value Date   CD4TABS 313 (L) 04/29/2023   CD4TABS 427 02/16/2023   CD4TABS 397 (L) 08/26/2022   Interval hx/current visit: After last seen patient was seen several times in the ED, as well as hospitalization in feb for ? 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   She reports good adherence to her antiretroviral therapy, Biktarvy, with no missed doses and no new issues. She recently received the influenza vaccine at her primary care provider's office and agreed to receive the COVID-19 vaccine during today's visit.  Reports significant mental health concerns,  including frequent panic attacks and anxiety. She is currently on Buspar, but reports no improvement in symptoms. She has previously tried [employer] with no relief. She expresses difficulty being in crowded places and has previously seen a mental health counselor at the clinic.  Reports dental concerns. She previously had partial dentures, but the top one did not fit and the bottom one broke. She has not seen a dentist recently and agreed to a referral to the dental clinic at the office.  She is not currently sexually active and lives with her daughter, who assists with daily tasks. She has bilateral above-knee amputations.  Both amputation sites have healed with no issues   She reports following with PCP as well as nephrology for  management of her comorbidities.  ROS: As stated in above HPI; all other systems were reviewed and are otherwise negative unless noted below  No reported fever / chills, night sweats, unintentional weight loss, acute visual change, odynophagia, chest pain/pressure, new or worsened SOB or WOB, nausea, vomiting, dysuria, GU discharge, syncope, seizures, red/hot swollen joints, hallucinations / delusions, rashes, new allergies, unusual / excessive bleeding, swollen lymph nodes  PMH/ PSH/ FamHx / Social Hx , medications and allergies reviewed and updated as appropriate; please see corresponding tab in EHR / prior notes                                        Current Outpatient Medications on File Prior to Visit  Medication Sig Dispense Refill   Accu-Chek Softclix Lancets lancets Use as instructed 100 each 12   albuterol (VENTOLIN HFA) 108 (90 Base) MCG/ACT inhaler Inhale 2 puffs into the lungs every 4 (four) hours as needed for wheezing or shortness of breath. 18 g 0   amLODipine-olmesartan (AZOR) 10-40 MG tablet Take 1 tablet by mouth every evening. 60 tablet 0   aspirin EC (EQ ASPIRIN ADULT LOW DOSE) 81 MG tablet Take 1 tablet (81 mg total) by mouth daily. Swallow whole. 120  tablet 2   atorvastatin (LIPITOR) 80 MG tablet Take 1 tablet (80 mg total) by mouth daily. 30 tablet 11   busPIRone (BUSPAR) 5 MG tablet Take 0.5 tablets (2.5 mg total) by mouth 2 (two) times daily. 30 tablet 2   calcitRIOL (ROCALTROL) 0.25 MCG capsule Take 5 capsules (1.25 mcg total) by mouth Every Tuesday,Thursday,and Saturday with dialysis. 60 capsule 0   calcium acetate (PHOSLO) 667 MG capsule Take 2 capsules (1,334 mg total) by mouth 3 (three) times daily with meals. 360 capsule 0   carvedilol (COREG) 12.5 MG tablet Take 1 tablet (12.5 mg total) by mouth 2 (two) times daily with a meal. 60 tablet 0   clopidogrel (PLAVIX) 75 MG tablet Take 1 tablet (75 mg total) by mouth daily. 19 tablet 0   Continuous Blood Gluc Sensor (DEXCOM G7 SENSOR) MISC Use to check blood sugar continuously 10 each 6   cyclobenzaprine (FLEXERIL) 5 MG tablet Take 5 mg by mouth 2 (two) times daily as needed for muscle spasms.     Darbepoetin Alfa (ARANESP) 200 MCG/0.4ML SOSY injection Inject 0.4 mLs (200 mcg total) into the skin every Monday at 6 PM. (Patient taking differently: Inject 200 mcg into the skin every 7 (seven) days. Every Monday) 1.68 mL    glucose blood test strip Use as instructed 100 each 12   insulin glargine (LANTUS SOLOSTAR) 100 UNIT/ML Solostar Pen Inject 15 Units into the skin at bedtime. 6 mL 4   Insulin Pen Needle (PENTIPS) 32G X 4 MM MISC Use as directed 100 each 3   liraglutide (VICTOZA) 18 MG/3ML SOPN Inject 0.6 mg into the skin daily. 3 mL 11   multivitamin (RENA-VIT) TABS tablet Take 1 tablet by mouth daily. (Patient taking differently: Take 1 tablet by mouth See admin instructions. On Tuesday, Wednesday and Thursday) 30 tablet 0   psyllium (HYDROCIL/METAMUCIL) 95 % PACK Take 1 packet by mouth daily. 60 each 0   Ubrogepant (UBRELVY) 50 MG TABS Take 1 tablet (50 mg total) by mouth as needed (migraine headache). if needed a second dose may be taken at least 2 hours after the initial dose; MAX 8  doses in a month 8 tablet 3   escitalopram (LEXAPRO) 5 MG tablet Take 1 tablet (5 mg total) by mouth daily. (Patient not taking: Reported on 05/21/2023) 30 tablet 2   hydrocortisone-pramoxine (PROCTOFOAM-HC) rectal foam Place 1 applicator rectally 2 (two) times daily. (Patient not taking: Reported on 05/21/2023) 10 g 1   lidocaine (LIDODERM) 5 % Place 1 patch onto the skin daily. Remove & Discard patch within 12 hours or as directed by MD (Patient not taking: Reported on 05/21/2023) 14 patch 0   mupirocin ointment (BACTROBAN) 2 % Apply topically 3 (three) times daily. (Patient not taking: Reported on 05/21/2023) 22 g 0   Nystatin (GERHARDT'S BUTT CREAM) CREA Apply 1 Application topically 2 (two) times daily. (Patient not taking: Reported on 05/21/2023) 1 each 0   pantoprazole (PROTONIX) 40 MG tablet Take 1 tablet (40 mg total) by mouth 2 (two) times daily. (Patient not taking: Reported on 05/21/2023) 60 tablet 2   sertraline (ZOLOFT) 25 MG tablet Take 25 mg by mouth daily. (Patient not taking: Reported on 05/21/2023)     No current facility-administered medications on file prior to visit.     No Known Allergies  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   Anemia, posthemorrhagic, acute 10/14/2022   Anxiety    CAD (coronary artery disease)    CAP (community acquired pneumonia) 09/29/2022   CVA (cerebral vascular accident) [facility])    Depression 06/28/2006   Qualifier: Diagnosis of  By: Laveda Abbe MD, Todd     Diabetes mellitus type 2 in obese 06/28/1994   Dyspnea    uses oxygeb 2 liters per minute at dialysis   End stage renal disease on dialysis [facility])    on hemodialysis T/Th/Sat   Erosive esophagitis    Esophageal reflux    Eye redness    Fecal incontinence 02/04/2018   Gastroparesis    ? diabetic   GERD (gastroesophageal reflux disease)    Human immunodeficiency virus (HIV) disease (HCC) 04/23/2016    Hyperkalemia 10/12/2022   Hyperlipidemia    Hypertension    Hypertensive emergency 11/09/2021   Hypertensive urgency 05/20/2021   Metabolic bone disease 05/02/2019   Moderate nonproliferative diabetic retinopathy of both eyes (HCC) 11/21/2014   11/14/14: Noted on retinal imaging; needs follow-up imaging in 6 months  05/22/16: Noted on retinal imaging again; needs follow-up imaging in 6 months   PAD (peripheral artery disease) (HCC)    Type 2 diabetes mellitus with diabetic peripheral angiopathy without gangrene (HCC) 05/01/2019   Wound infection s/p L transmetatarsal amputation    Past Surgical History:  Procedure Laterality Date   A/V SHUNTOGRAM Left 04/21/2023   Procedure: A/V SHUNTOGRAM;  Surgeon: Victorino Sparrow, MD;  Location: [facility] INVASIVE CV LAB;  Service: Cardiovascular;  Laterality: Left;   ABDOMINAL AORTOGRAM W/LOWER EXTREMITY N/A 12/13/2020   Procedure: ABDOMINAL AORTOGRAM W/LOWER EXTREMITY;  Surgeon: Chuck Hint, MD;  Location: [facility] INVASIVE CV LAB;  Service: Cardiovascular;  Laterality: N/A;   AMPUTATION Left 07/08/2018   Procedure: AMPUTATION FORTH RAY LEFT FOOT;  Surgeon: Nadara Mustard, MD;  Location: [facility] OR;  Service: Orthopedics;  Laterality: Left;   AMPUTATION Left 08/09/2018   Procedure: Left Transmetatarsal Amputation;  Surgeon: Nadara Mustard, MD;  Location: [facility] OR;  Service: Orthopedics;  Laterality: Left;   AMPUTATION Left 10/08/2018   Procedure: LEFT BELOW KNEE AMPUTATION;  Surgeon: Nadara Mustard, MD;  Location: [facility] OR;  Service: Orthopedics;  Laterality: Left;   AMPUTATION Left 10/28/2018   Procedure: REVISION BELOW KNEE AMPUTATION;  Surgeon: Nadara Mustard, MD;  Location: [facility] OR;  Service: Orthopedics;  Laterality: Left;   AMPUTATION Left 12/16/2018   Procedure: LEFT ABOVE KNEE AMPUTATION;  Surgeon: Nadara Mustard, MD;  Location: [facility] OR;  Service: Orthopedics;  Laterality: Left;   AMPUTATION Right 01/31/2021   Procedure: AMPUTATION BELOW KNEE;  Surgeon: Nada Libman, MD;  Location: [facility] OR;  Service: Vascular;  Laterality: Right;   AMPUTATION Right 03/28/2021   Procedure: REVISION OF RIGHT BELOW KNEE AMPUTATION;  Surgeon: Nada Libman, MD;  Location: [facility] OR;  Service: Vascular;  Laterality: Right;   AMPUTATION Right 04/18/2021   Procedure: right below knee amputation/washout placement wound vac;  Surgeon: Leonie Douglas, MD;  Location: [facility] OR;  Service: Vascular;  Laterality: Right;   AMPUTATION Right 02/19/2023   Procedure: CONVERSION TO ABOVE KNEE AMPUTATION;  Surgeon: Maeola Harman, MD;  Location: [facility] OR;  Service: Vascular;  Laterality: Right;   APPLICATION OF WOUND VAC Left 12/31/2022   Procedure: APPLICATION OF PREVENA WOUND VAC;  Surgeon: Victorino Sparrow, MD;  Location: [facility] OR;  Service: Vascular;  Laterality: Left;   AV FISTULA PLACEMENT Left 10/14/2018   Procedure: Arteriovenous (Av) Fistula Creation Left Arm;  Surgeon: Cephus Shelling, MD;  Location: [facility] OR;  Service: Vascular;  Laterality: Left;   BASCILIC VEIN TRANSPOSITION Left 04/14/2019   Procedure: BASILIC VEIN TRANSPOSITION SECOND STAGE LEFT ARM;  Surgeon: Larina Earthly, MD;  Location: [facility] OR;  Service: Vascular;  Laterality: Left;   BIOPSY  10/14/2022   Procedure: BIOPSY;  Surgeon: Benancio Deeds, MD;  Location: [facility] ENDOSCOPY;  Service: Gastroenterology;;   COMPLEX WOUND CLOSURE Left 12/31/2022   Procedure: COMPLEX WOUND CLOSURE;  Surgeon: Victorino Sparrow, MD;  Location: [facility] OR;  Service: Vascular;  Laterality: Left;   CORONARY STENT INTERVENTION N/A 05/21/2021   Procedure: CORONARY STENT INTERVENTION;  Surgeon: Marykay Lex, MD;  Location: [facility] INVASIVE CV LAB;  Service: Cardiovascular;  Laterality: N/A;   ESOPHAGOGASTRODUODENOSCOPY (EGD) WITH PROPOFOL N/A 10/14/2022   Procedure: ESOPHAGOGASTRODUODENOSCOPY (EGD) WITH PROPOFOL;  Surgeon: Benancio Deeds, MD;  Location: [facility] ENDOSCOPY;  Service: Gastroenterology;  Laterality: N/A;   HEMOSTASIS CLIP PLACEMENT  10/14/2022    Procedure: HEMOSTASIS CLIP PLACEMENT;  Surgeon: Benancio Deeds, MD;  Location: [facility] ENDOSCOPY;  Service: Gastroenterology;;   INCISION AND DRAINAGE Left 12/31/2022   Procedure: INCISION AND DRAINAGE OF LEFT ARM;  Surgeon: Victorino Sparrow, MD;  Location: [facility] OR;  Service: Vascular;  Laterality: Left;   INCISION AND DRAINAGE ABSCESS N/A 10/24/2020   Procedure: exicision of hydradenitis;  Surgeon: Romie Levee, MD;  Location: [facility];  Service: General;  Laterality: N/A;  45 min   INSERTION OF DIALYSIS CATHETER Right 04/14/2019   Procedure: INSERTION OF DIALYSIS CATHETER;  Surgeon: Larina Earthly, MD;  Location: [facility] OR;  Service: Vascular;  Laterality: Right;   IR FLUORO GUIDE CV LINE RIGHT  10/13/2022   IR REMOVAL TUN CV CATH W/O FL  10/16/2022   IR US GUIDE VASC ACCESS RIGHT  10/13/2022   LEFT HEART CATH AND CORONARY ANGIOGRAPHY N/A 05/21/2021   Procedure: LEFT HEART CATH AND CORONARY ANGIOGRAPHY;  Surgeon: Marykay Lex, MD;  Location: [facility] INVASIVE CV LAB;  Service: Cardiovascular;  Laterality: N/A;   LOWER EXTREMITY ANGIOGRAPHY N/A 07/05/2018   Procedure: LOWER EXTREMITY ANGIOGRAPHY;  Surgeon: Nada Libman, MD;  Location: [facility] INVASIVE CV LAB;  Service: Cardiovascular;  Laterality: N/A;   PERIPHERAL VASCULAR ATHERECTOMY Right 12/13/2020   Procedure: PERIPHERAL VASCULAR ATHERECTOMY;  Surgeon: Chuck Hint, MD;  Location: [facility] INVASIVE CV LAB;  Service: Cardiovascular;  Laterality: Right;  Superficial femoral   PERIPHERAL VASCULAR BALLOON ANGIOPLASTY Left 07/05/2018   Procedure: PERIPHERAL VASCULAR BALLOON ANGIOPLASTY;  Surgeon: Nada Libman, MD;  Location[facility] INVASIVE CV LAB;  Service: Cardiovascular;  Laterality: Left;  SFA   PERIPHERAL VASCULAR BALLOON ANGIOPLASTY Right 12/13/2020   Procedure: PERIPHERAL VASCULAR BALLOON ANGIOPLASTY;  Surgeon: Chuck Hint, MD;  Location: [facility] INVASIVE CV LAB;  Service: Cardiovascular;  Laterality: Right;  Peroneal artery,  anterior tibial artery   RECTAL EXAM UNDER ANESTHESIA N/A 10/24/2020   Procedure: EXAM UNDER ANESTHESIA;  Surgeon: Romie Levee, MD;  Location: [facility];  Service: General;  Laterality: N/A;   STUMP REVISION Left 10/19/2018   Procedure: REVISION LEFT BELOW KNEE AMPUTATION;  Surgeon: Nadara Mustard, MD;  Location: [facility] OR;  Service: Orthopedics;  Laterality: Left;   TUBAL LIGATION  2002   Social History   Socioeconomic History   Marital status: Single    Spouse name: Not on file   Number of children: Not on file   Years of education: 10   Highest education level: Not on file  Occupational History    Employer: UNEMPLOYED  Tobacco Use   Smoking status: Former    Current packs/day: 0.00    Average packs/day: 0.1 packs/day for 10.0 years (1.0 ttl pk-yrs)    Types: Cigarettes    Start date: 11/17/2013    Quit date: 09/09/2014    Years since quitting: 8.7   Smokeless tobacco: Never  Vaping Use   Vaping status: Never Used  Substance and Sexual Activity   Alcohol use: Not Currently   Drug use: Not Currently    Types: Marijuana    Comment: 2 times a monthy -  `last time [place] 2022   Sexual activity: Not Currently    Partners: Female, Female  Other Topics Concern   Not on file  Social History Narrative   Not on file   Social Determinants of Health   Financial Resource Strain: Low Risk  (10/19/2022)   Overall Financial Resource Strain (CARDIA)    Difficulty of Paying Living Expenses: Not hard at all  Food Insecurity: No Food Insecurity (04/29/2023)   Hunger Vital Sign    Worried About Running Out of Food in the Last Year: Never true    Ran Out of Food in the Last Year: Never true  Transportation Needs: No Transportation Needs (04/27/2023)   PRAPARE - Administrator, Civil Service (Medical): No    Lack of Transportation (Non-Medical): No  Physical Activity: Inactive (10/19/2022)   Exercise Vital Sign    Days of Exercise per Week: 0 days    Minutes of  Exercise per Session: 0 min  Stress: Stress Concern Present (10/19/2022)   [employer] of Occupational Health - Occupational Stress Questionnaire    Feeling of Stress : Very much  Social Connections: Socially Isolated (10/19/2022)   Social Connection and Isolation Panel [NHANES]    Frequency of Communication with Friends and Family: More than three times a week    Frequency of Social Gatherings with Friends and Family: Never    Attends Religious Services: Never    Database administrator or Organizations: No    Attends Banker Meetings: Never    Marital  Status: Never married  Intimate Partner Violence: Not At Risk (04/29/2023)   Humiliation, Afraid, Rape, and Kick questionnaire    Fear of Current or Ex-Partner: No    Emotionally Abused: No    Physically Abused: No    Sexually Abused: No   Family History  Problem Relation Age of Onset   Diabetes Mother    Diabetes Brother    Diabetes Daughter    Diabetes Daughter    Mental retardation Brother        died from PNA   Diabetes Maternal Grandmother    Vitals  BP (!) 151/77   Pulse 83   Resp 16   Ht 5' (1.524 m)   Wt 135 lb (61.2 kg)   LMP 05/21/2023 (Approximate) Comment: tubal ligation  BMI 26.37 kg/m    Examination  Gen: no acute distress HEENT: [place]/AT, no scleral icterus, no pale conjunctivae, hearing normal, oral mucosa moist Neck: Supple Cardio: Regular rate and rhythm Resp: Pulmonary effort normal in room air. Normal breath sounds  GI: nondistended GU: Musc: Extremities: B/l AKA with well healed stump Skin: No rashes, Left UE AVF Neuro: grossly non focal , awake, alert and oriented * 3  Psych: Calm, cooperative  Lab Results HIV 1 RNA Quant  Date Value  04/29/2023 <20 copies/mL  02/16/2023 <20 copies/mL  08/26/2022 Not Detected Copies/mL   HIV-1 RNA Viral Load (copies/mL)  Date Value  12/23/2017 80  02/02/2017 80   CD4 T Cell Abs (/uL)  Date Value  04/29/2023 313 (L)  02/16/2023 427   08/26/2022 397 (L)   No results found for: "HIV1GENOSEQ" Lab Results  Component Value Date   WBC 3.3 (L) 04/29/2023   HGB 11.5 (L) 04/29/2023   HCT 36.4 04/29/2023   MCV 89.0 04/29/2023   PLT 246 04/29/2023    Lab Results  Component Value Date   CREATININE 8.57 (H) 04/29/2023   BUN 83 (H) 04/29/2023   NA 134 (L) 04/29/2023   K 4.4 04/29/2023   CL 90 (L) 04/29/2023   CO2 21 (L) 04/29/2023   Lab Results  Component Value Date   ALT 10 04/26/2023   AST 14 (L) 04/26/2023   ALKPHOS 73 04/26/2023   BILITOT 0.7 04/26/2023    Lab Results  Component Value Date   CHOL 185 04/27/2023   TRIG 107 04/27/2023   HDL 51 04/27/2023   LDLCALC 113 (H) 04/27/2023   No results found for: "HAV" Lab Results  Component Value Date   HEPBSAG NON REACTIVE 04/26/2023   HEPBSAB Reactive (A) 11/10/2021   Lab Results  Component Value Date   HCVAB <0.1 05/03/2019   Lab Results  Component Value Date   CHLAMYDIAWP Negative 10/07/2016   N Negative 10/07/2016   No results found for: "GCPROBEAPT" No results found for: "QUANTGOLD"    Health Maintenance: Immunization History  Administered Date(s) Administered   Hepatitis B, ADULT 05/27/2016, 07/08/2016, 05/25/2019, 06/22/2019, 07/20/2019, 11/23/2019   Influenza, Quadrivalent, Recombinant, Inj, Pf 05/06/2019, 05/28/2022   Influenza, Seasonal, Injecte, Preservative Fre 05/10/2023   Influenza,inj,Quad PF,6+ Mos 04/21/2016, 05/17/2017, 10/09/2018, 05/06/2019, 05/29/2020, 04/22/2021   Meningococcal Mcv4o 07/08/2016, 10/07/2016   Moderna Sars-Covid-2 Vaccination 10/31/2019   Pneumococcal Conjugate-13 01/05/2018   Pneumococcal Polysaccharide-23 11/16/2013   Tdap 09/17/2017    Assessment/Plan: # HIV, well controlled  - Continue Biktarvy, meds refilled  - Recent labs from 04/2023 reviewed  - Fu in 3-4 months  then plan for fu visit every 5-6 months  # Anxiety/Depression - No SI or  HI - Referral done for new [facility] counselor as previous  provider not working anymore at [facility]  # HTN  - Follows PCP   # Immunization  - Has received flu vaccine 05/10/23 - COVID, PCV 20 vaccine today   #Health maintenance Hep C status due Hep B status immune to Hepatitis B  Syphilis negative  TB testing quantiferon negative 04/23/2016 LDL 11/9 04/27/23 OI ppx if indicated None  HLA [id number] Neg 06/2018 Papsmear - HPV positive on 04/2018, due for repeat study and discussed to fu  Dental Care - dental clinic referral done Mammogram discussed to fu with PCP  Colonoscopy  Patient's labs were reviewed as well as his previous records. Patients questions were addressed and answered. Safe sex counseling done.  I have personally spent 41 minutes involved in face-to-face and non-face-to-face activities for this patient on the day of the visit. Professional time spent includes the following activities: Preparing to see the patient (review of tests), Obtaining and/or reviewing separately obtained history (admission/discharge record), Performing a medically appropriate examination and/or evaluation , Ordering medications/tests/procedures, referring and communicating with other health care professionals, Documenting clinical information in the EMR, Independently interpreting results (not separately reported), Communicating results to the patient/family/caregiver, Counseling and educating the patient/family/caregiver and Care coordination (not separately reported).    Electronically signed by:  Odette Fraction, MD Infectious Disease Physician [facility] [address] [place], [place] [address] Phone: [phone]  Fax: [phone]

## 2023-05-21 NOTE — Addendum Note (Signed)
Addended by: Odette Fraction on: 05/21/2023 02:55 PM   Modules accepted: Level of Service

## 2023-05-21 NOTE — Patient Instructions (Signed)
Visit Information  Thank you for taking time to visit with me today. Please don't hesitate to contact me if I can be of assistance to you.   Following are the goals we discussed today:   Goals Addressed             This Visit's Progress    CCM Expected Outcome:  Monitor, Self-Manage, and Reduce Symptoms of Hypertension       Patient Goals/Self Care Activities: -Patient/Caregiver will take medications as prescribed   -Patient/Caregiver will attend all scheduled provider appointments -Patient/Caregiver will call pharmacy for medication refills 3-7 days in advance of running out of medications -Patient/Caregiver will call provider office for new concerns or questions  -Patient/Caregiver will focus on medication adherence by taking medications as prescribed   Last practice recorded BP readings:  BP Readings from Last 3 Encounters:  05/21/23 (!) 151/77  05/18/23 (!) 207/95  05/10/23 (!) 179/57   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 treatment plan related to hypertension self management and patient's adherence to plan as established by provider Provided education to patient re: stroke prevention, s/s of heart attack and stroke Reviewed medications with patient and discussed importance of compliance Discussed plans with patient for ongoing care management follow up and provided patient with direct contact information for care management team Discussed complications of poorly controlled blood pressure such as heart disease, stroke, circulatory complications, vision complications, kidney impairment, sexual dysfunction      COMPLETED: Hopital f/u for above the knee right upper extremty       Care Coordination Interventions: 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 and patient's adherence to plan as established by provider Advised patient to follow protocol established by the physician Reviewed medications with patient and discussed pain medication and advised her to talk with her PCP about the  medication and what to do Reviewed scheduled/upcoming provider appointments including [facility] on 03/02/23 -Patient/Caregiver will self-administer medications as prescribed as evidenced by self-report/primary caregiver report  -Patient/Caregiver will attend all scheduled provider appointments as evidenced by clinician review of documented attendance to scheduled appointments and patient/caregiver report -Patient/Caregiver will call pharmacy for medication refills as evidenced by patient report and review of pharmacy fill history as appropriate -Patient/Caregiver will call provider office for new concerns or questions as evidenced by review of documented incoming telephone call notes and patient report  Continue with physical therapy to get stronger           Our next appointment is by telephone on 06/24/23 at 130 pm  Please call the care guide team at [phone] if you need to cancel or reschedule your appointment.   If you are experiencing a Mental Health or Behavioral Health Crisis or need someone to talk to, please call 1-800-273-TALK (toll free, 24 hour hotline)  The patient verbalized understanding of instructions, educational materials, and care plan provided today.    Juanell Fairly RN, BSN, [facility] [employer]er/designer Phone: [phone]

## 2023-05-22 DIAGNOSIS — Z992 Dependence on renal dialysis: Secondary | ICD-10-CM | POA: Diagnosis not present

## 2023-05-22 DIAGNOSIS — R197 Diarrhea, unspecified: Secondary | ICD-10-CM | POA: Diagnosis not present

## 2023-05-22 DIAGNOSIS — T8249XA Other complication of vascular dialysis catheter, initial encounter: Secondary | ICD-10-CM | POA: Diagnosis not present

## 2023-05-22 DIAGNOSIS — E1110 Type 2 diabetes mellitus with ketoacidosis without coma: Secondary | ICD-10-CM | POA: Diagnosis not present

## 2023-05-22 DIAGNOSIS — N186 End stage renal disease: Secondary | ICD-10-CM | POA: Diagnosis not present

## 2023-05-22 DIAGNOSIS — D689 Coagulation defect, unspecified: Secondary | ICD-10-CM | POA: Diagnosis not present

## 2023-05-22 DIAGNOSIS — D631 Anemia in chronic kidney disease: Secondary | ICD-10-CM | POA: Diagnosis not present

## 2023-05-22 DIAGNOSIS — R519 Headache, unspecified: Secondary | ICD-10-CM | POA: Diagnosis not present

## 2023-05-22 DIAGNOSIS — L299 Pruritus, unspecified: Secondary | ICD-10-CM | POA: Diagnosis not present

## 2023-05-22 DIAGNOSIS — N2581 Secondary hyperparathyroidism of renal origin: Secondary | ICD-10-CM | POA: Diagnosis not present

## 2023-05-24 ENCOUNTER — Other Ambulatory Visit: Payer: Self-pay

## 2023-05-24 DIAGNOSIS — K529 Noninfective gastroenteritis and colitis, unspecified: Secondary | ICD-10-CM | POA: Diagnosis not present

## 2023-05-24 DIAGNOSIS — Z89611 Acquired absence of right leg above knee: Secondary | ICD-10-CM | POA: Diagnosis not present

## 2023-05-24 DIAGNOSIS — D62 Acute posthemorrhagic anemia: Secondary | ICD-10-CM | POA: Diagnosis not present

## 2023-05-24 DIAGNOSIS — E1151 Type 2 diabetes mellitus with diabetic peripheral angiopathy without gangrene: Secondary | ICD-10-CM | POA: Diagnosis not present

## 2023-05-24 DIAGNOSIS — R569 Unspecified convulsions: Secondary | ICD-10-CM | POA: Diagnosis not present

## 2023-05-24 DIAGNOSIS — D631 Anemia in chronic kidney disease: Secondary | ICD-10-CM | POA: Diagnosis not present

## 2023-05-24 DIAGNOSIS — E1122 Type 2 diabetes mellitus with diabetic chronic kidney disease: Secondary | ICD-10-CM | POA: Diagnosis not present

## 2023-05-24 DIAGNOSIS — Z6823 Body mass index (BMI) 23.0-23.9, adult: Secondary | ICD-10-CM | POA: Diagnosis not present

## 2023-05-24 DIAGNOSIS — Z8673 Personal history of transient ischemic attack (TIA), and cerebral infarction without residual deficits: Secondary | ICD-10-CM | POA: Diagnosis not present

## 2023-05-24 DIAGNOSIS — K2100 Gastro-esophageal reflux disease with esophagitis, without bleeding: Secondary | ICD-10-CM | POA: Diagnosis not present

## 2023-05-24 DIAGNOSIS — K3184 Gastroparesis: Secondary | ICD-10-CM | POA: Diagnosis not present

## 2023-05-24 DIAGNOSIS — N186 End stage renal disease: Secondary | ICD-10-CM | POA: Diagnosis not present

## 2023-05-24 DIAGNOSIS — Z992 Dependence on renal dialysis: Secondary | ICD-10-CM | POA: Diagnosis not present

## 2023-05-24 DIAGNOSIS — E113393 Type 2 diabetes mellitus with moderate nonproliferative diabetic retinopathy without macular edema, bilateral: Secondary | ICD-10-CM | POA: Diagnosis not present

## 2023-05-24 DIAGNOSIS — E785 Hyperlipidemia, unspecified: Secondary | ICD-10-CM | POA: Diagnosis not present

## 2023-05-24 DIAGNOSIS — I120 Hypertensive chronic kidney disease with stage 5 chronic kidney disease or end stage renal disease: Secondary | ICD-10-CM | POA: Diagnosis not present

## 2023-05-24 DIAGNOSIS — I2510 Atherosclerotic heart disease of native coronary artery without angina pectoris: Secondary | ICD-10-CM | POA: Diagnosis not present

## 2023-05-24 DIAGNOSIS — G43909 Migraine, unspecified, not intractable, without status migrainosus: Secondary | ICD-10-CM | POA: Diagnosis not present

## 2023-05-24 DIAGNOSIS — E1143 Type 2 diabetes mellitus with diabetic autonomic (poly)neuropathy: Secondary | ICD-10-CM | POA: Diagnosis not present

## 2023-05-24 DIAGNOSIS — Z89612 Acquired absence of left leg above knee: Secondary | ICD-10-CM | POA: Diagnosis not present

## 2023-05-24 DIAGNOSIS — E1142 Type 2 diabetes mellitus with diabetic polyneuropathy: Secondary | ICD-10-CM | POA: Diagnosis not present

## 2023-05-24 MED ORDER — UBRELVY 50 MG PO TABS: 50.0000 mg | ORAL_TABLET | ORAL | 3 refills | Status: DC | PRN

## 2023-05-25 ENCOUNTER — Other Ambulatory Visit: Payer: Self-pay

## 2023-05-25 ENCOUNTER — Other Ambulatory Visit: Payer: Self-pay | Admitting: Pharmacist

## 2023-05-25 ENCOUNTER — Other Ambulatory Visit (HOSPITAL_COMMUNITY): Payer: Self-pay

## 2023-05-25 ENCOUNTER — Encounter (HOSPITAL_COMMUNITY): Payer: Self-pay

## 2023-05-25 DIAGNOSIS — N186 End stage renal disease: Secondary | ICD-10-CM | POA: Diagnosis not present

## 2023-05-25 DIAGNOSIS — E1110 Type 2 diabetes mellitus with ketoacidosis without coma: Secondary | ICD-10-CM | POA: Diagnosis not present

## 2023-05-25 DIAGNOSIS — D689 Coagulation defect, unspecified: Secondary | ICD-10-CM | POA: Diagnosis not present

## 2023-05-25 DIAGNOSIS — D631 Anemia in chronic kidney disease: Secondary | ICD-10-CM | POA: Diagnosis not present

## 2023-05-25 DIAGNOSIS — N2581 Secondary hyperparathyroidism of renal origin: Secondary | ICD-10-CM | POA: Diagnosis not present

## 2023-05-25 DIAGNOSIS — R197 Diarrhea, unspecified: Secondary | ICD-10-CM | POA: Diagnosis not present

## 2023-05-25 DIAGNOSIS — R519 Headache, unspecified: Secondary | ICD-10-CM | POA: Diagnosis not present

## 2023-05-25 DIAGNOSIS — T8249XA Other complication of vascular dialysis catheter, initial encounter: Secondary | ICD-10-CM | POA: Diagnosis not present

## 2023-05-25 DIAGNOSIS — L299 Pruritus, unspecified: Secondary | ICD-10-CM | POA: Diagnosis not present

## 2023-05-25 DIAGNOSIS — Z992 Dependence on renal dialysis: Secondary | ICD-10-CM | POA: Diagnosis not present

## 2023-05-25 NOTE — Progress Notes (Signed)
Specialty Pharmacy Initiation Note   Kathy Frank is a 47 y.o. female who will be followed by the specialty pharmacy service for RxSp HIV    Review of administration, indication, effectiveness, safety, potential side effects, storage/disposable, and missed dose instructions occurred today for patient's specialty medication(s) Bictegravir-Emtricitab-Tenofov     Patient/Caregiver did not have any additional questions or concerns.   Patient's therapy is appropriate to: Continue    Goals Addressed             This Visit's Progress    Achieve Undetectable HIV Viral Load < 20       Patient is on track. Patient will maintain adherence      Increase CD4 count until steady state       Patient is on track. Patient will maintain adherence         Jennette Kettle Specialty Pharmacist

## 2023-05-25 NOTE — Progress Notes (Signed)
Specialty Pharmacy Initial Fill Coordination Note  Kathy Frank is a 47 y.o. female contacted today regarding refills of specialty medication(s) Bictegravir-Emtricitab-Tenofov   Patient requested Delivery   Delivery date: 06/01/23   Verified address: [address] [place] [address]   Medication will be filled on 05/31/23.   Patient is aware of $0 copayment.

## 2023-05-27 ENCOUNTER — Ambulatory Visit: Payer: 59 | Admitting: Student

## 2023-05-27 DIAGNOSIS — N2581 Secondary hyperparathyroidism of renal origin: Secondary | ICD-10-CM | POA: Diagnosis not present

## 2023-05-27 DIAGNOSIS — E1110 Type 2 diabetes mellitus with ketoacidosis without coma: Secondary | ICD-10-CM | POA: Diagnosis not present

## 2023-05-27 DIAGNOSIS — D631 Anemia in chronic kidney disease: Secondary | ICD-10-CM | POA: Diagnosis not present

## 2023-05-27 DIAGNOSIS — D689 Coagulation defect, unspecified: Secondary | ICD-10-CM | POA: Diagnosis not present

## 2023-05-27 DIAGNOSIS — Z992 Dependence on renal dialysis: Secondary | ICD-10-CM | POA: Diagnosis not present

## 2023-05-27 DIAGNOSIS — N186 End stage renal disease: Secondary | ICD-10-CM | POA: Diagnosis not present

## 2023-05-27 DIAGNOSIS — L299 Pruritus, unspecified: Secondary | ICD-10-CM | POA: Diagnosis not present

## 2023-05-27 DIAGNOSIS — R519 Headache, unspecified: Secondary | ICD-10-CM | POA: Diagnosis not present

## 2023-05-27 DIAGNOSIS — R197 Diarrhea, unspecified: Secondary | ICD-10-CM | POA: Diagnosis not present

## 2023-05-27 DIAGNOSIS — T8249XA Other complication of vascular dialysis catheter, initial encounter: Secondary | ICD-10-CM | POA: Diagnosis not present

## 2023-05-27 NOTE — Progress Notes (Deleted)
   CC: ***  This is a telephone encounter between [name] and Laretta Bolster on 05/27/2023 for ***. The visit was conducted with the patient located at {NAMES:[id number]::"home"} and Laretta Bolster at [employer]. The patient's identity was confirmed using their DOB and current address. The {WHO:3044014::"patient","his/her legal guardian","***"} has consented to being evaluated through a telephone encounter and understands the associated risks (an examination cannot be done and the patient may need to come in for an appointment) / benefits (allows the patient to remain at home, decreasing exposure to coronavirus). I personally spent {Numbers; [id number]:[id number]} minutes on medical discussion.   HPI:  [name] is a 47 y.o. with PMH as below.   Please see A&P for assessment of the patient's acute and chronic medical conditions.   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 diabetic   GERD (gastroesophageal reflux disease)    Human immunodeficiency virus (HIV) disease (HCC) 04/23/2016   Hyperkalemia 10/12/2022   Hyperlipidemia    Hypertension    Hypertensive emergency 11/09/2021   Hypertensive urgency 05/20/2021   Metabolic bone disease 05/02/2019   Moderate nonproliferative diabetic retinopathy of both eyes (HCC) 11/21/2014    11/14/14: Noted on retinal imaging; needs follow-up imaging in 6 months  05/22/16: Noted on retinal imaging again; needs follow-up imaging in 6 months   PAD (peripheral artery disease) (HCC)    Type 2 diabetes mellitus with diabetic peripheral angiopathy without gangrene (HCC) 05/01/2019   Wound infection s/p L transmetatarsal amputation    Review of Systems:  ***    Assessment & Plan:   No problem-specific Assessment & Plan notes found for this encounter.    Patient {GC/GE:3044014::"discussed with","seen with"} Dr. {ZOXWR:[id number]::"WJXBJY","NWG","N. Hoffman","Williams","Mullen","Narendra","Guilloud","Vincent"}  Laretta Bolster, MD  Internal Medicine Resident

## 2023-05-29 DIAGNOSIS — E1110 Type 2 diabetes mellitus with ketoacidosis without coma: Secondary | ICD-10-CM | POA: Diagnosis not present

## 2023-05-29 DIAGNOSIS — Z992 Dependence on renal dialysis: Secondary | ICD-10-CM | POA: Diagnosis not present

## 2023-05-29 DIAGNOSIS — R519 Headache, unspecified: Secondary | ICD-10-CM | POA: Diagnosis not present

## 2023-05-29 DIAGNOSIS — N2581 Secondary hyperparathyroidism of renal origin: Secondary | ICD-10-CM | POA: Diagnosis not present

## 2023-05-29 DIAGNOSIS — T8249XA Other complication of vascular dialysis catheter, initial encounter: Secondary | ICD-10-CM | POA: Diagnosis not present

## 2023-05-29 DIAGNOSIS — D631 Anemia in chronic kidney disease: Secondary | ICD-10-CM | POA: Diagnosis not present

## 2023-05-29 DIAGNOSIS — R197 Diarrhea, unspecified: Secondary | ICD-10-CM | POA: Diagnosis not present

## 2023-05-29 DIAGNOSIS — L299 Pruritus, unspecified: Secondary | ICD-10-CM | POA: Diagnosis not present

## 2023-05-29 DIAGNOSIS — N186 End stage renal disease: Secondary | ICD-10-CM | POA: Diagnosis not present

## 2023-05-29 DIAGNOSIS — D689 Coagulation defect, unspecified: Secondary | ICD-10-CM | POA: Diagnosis not present

## 2023-05-31 ENCOUNTER — Encounter: Payer: Self-pay | Admitting: Dietician

## 2023-06-01 DIAGNOSIS — R197 Diarrhea, unspecified: Secondary | ICD-10-CM | POA: Diagnosis not present

## 2023-06-01 DIAGNOSIS — D689 Coagulation defect, unspecified: Secondary | ICD-10-CM | POA: Diagnosis not present

## 2023-06-01 DIAGNOSIS — R519 Headache, unspecified: Secondary | ICD-10-CM | POA: Diagnosis not present

## 2023-06-01 DIAGNOSIS — L299 Pruritus, unspecified: Secondary | ICD-10-CM | POA: Diagnosis not present

## 2023-06-01 DIAGNOSIS — Z992 Dependence on renal dialysis: Secondary | ICD-10-CM | POA: Diagnosis not present

## 2023-06-01 DIAGNOSIS — T8249XA Other complication of vascular dialysis catheter, initial encounter: Secondary | ICD-10-CM | POA: Diagnosis not present

## 2023-06-01 DIAGNOSIS — N2581 Secondary hyperparathyroidism of renal origin: Secondary | ICD-10-CM | POA: Diagnosis not present

## 2023-06-01 DIAGNOSIS — N186 End stage renal disease: Secondary | ICD-10-CM | POA: Diagnosis not present

## 2023-06-01 DIAGNOSIS — D631 Anemia in chronic kidney disease: Secondary | ICD-10-CM | POA: Diagnosis not present

## 2023-06-01 DIAGNOSIS — E1110 Type 2 diabetes mellitus with ketoacidosis without coma: Secondary | ICD-10-CM | POA: Diagnosis not present

## 2023-06-03 DIAGNOSIS — E1110 Type 2 diabetes mellitus with ketoacidosis without coma: Secondary | ICD-10-CM | POA: Diagnosis not present

## 2023-06-03 DIAGNOSIS — D631 Anemia in chronic kidney disease: Secondary | ICD-10-CM | POA: Diagnosis not present

## 2023-06-03 DIAGNOSIS — N186 End stage renal disease: Secondary | ICD-10-CM | POA: Diagnosis not present

## 2023-06-03 DIAGNOSIS — T8249XA Other complication of vascular dialysis catheter, initial encounter: Secondary | ICD-10-CM | POA: Diagnosis not present

## 2023-06-03 DIAGNOSIS — N2581 Secondary hyperparathyroidism of renal origin: Secondary | ICD-10-CM | POA: Diagnosis not present

## 2023-06-03 DIAGNOSIS — D689 Coagulation defect, unspecified: Secondary | ICD-10-CM | POA: Diagnosis not present

## 2023-06-03 DIAGNOSIS — R519 Headache, unspecified: Secondary | ICD-10-CM | POA: Diagnosis not present

## 2023-06-03 DIAGNOSIS — L299 Pruritus, unspecified: Secondary | ICD-10-CM | POA: Diagnosis not present

## 2023-06-03 DIAGNOSIS — Z992 Dependence on renal dialysis: Secondary | ICD-10-CM | POA: Diagnosis not present

## 2023-06-03 DIAGNOSIS — R197 Diarrhea, unspecified: Secondary | ICD-10-CM | POA: Diagnosis not present

## 2023-06-07 ENCOUNTER — Other Ambulatory Visit: Payer: Self-pay

## 2023-06-07 MED ORDER — CARVEDILOL 12.5 MG PO TABS: 12.5000 mg | ORAL_TABLET | Freq: Two times a day (BID) | ORAL | 0 refills | Status: DC

## 2023-06-08 ENCOUNTER — Other Ambulatory Visit: Payer: Self-pay

## 2023-06-08 ENCOUNTER — Telehealth: Payer: Self-pay | Admitting: *Deleted

## 2023-06-08 DIAGNOSIS — Z992 Dependence on renal dialysis: Secondary | ICD-10-CM | POA: Diagnosis not present

## 2023-06-08 DIAGNOSIS — D689 Coagulation defect, unspecified: Secondary | ICD-10-CM | POA: Diagnosis not present

## 2023-06-08 DIAGNOSIS — L299 Pruritus, unspecified: Secondary | ICD-10-CM | POA: Diagnosis not present

## 2023-06-08 DIAGNOSIS — N186 End stage renal disease: Secondary | ICD-10-CM | POA: Diagnosis not present

## 2023-06-08 DIAGNOSIS — D631 Anemia in chronic kidney disease: Secondary | ICD-10-CM | POA: Diagnosis not present

## 2023-06-08 DIAGNOSIS — E1110 Type 2 diabetes mellitus with ketoacidosis without coma: Secondary | ICD-10-CM | POA: Diagnosis not present

## 2023-06-08 DIAGNOSIS — N2581 Secondary hyperparathyroidism of renal origin: Secondary | ICD-10-CM | POA: Diagnosis not present

## 2023-06-08 DIAGNOSIS — R197 Diarrhea, unspecified: Secondary | ICD-10-CM | POA: Diagnosis not present

## 2023-06-08 DIAGNOSIS — R519 Headache, unspecified: Secondary | ICD-10-CM | POA: Diagnosis not present

## 2023-06-08 DIAGNOSIS — T8249XA Other complication of vascular dialysis catheter, initial encounter: Secondary | ICD-10-CM | POA: Diagnosis not present

## 2023-06-08 NOTE — Telephone Encounter (Signed)
Spoke with patient regarding mammogram appt for 07/02/2023. Patient states she will call breast [phone]) to reschedule due to transportation with daughter. She is also aware of the $75 no show fee.

## 2023-06-09 ENCOUNTER — Other Ambulatory Visit (HOSPITAL_COMMUNITY): Payer: Self-pay

## 2023-06-09 ENCOUNTER — Other Ambulatory Visit: Payer: Self-pay

## 2023-06-09 DIAGNOSIS — T8249XA Other complication of vascular dialysis catheter, initial encounter: Secondary | ICD-10-CM | POA: Diagnosis not present

## 2023-06-09 DIAGNOSIS — N2581 Secondary hyperparathyroidism of renal origin: Secondary | ICD-10-CM | POA: Diagnosis not present

## 2023-06-09 DIAGNOSIS — E1110 Type 2 diabetes mellitus with ketoacidosis without coma: Secondary | ICD-10-CM | POA: Diagnosis not present

## 2023-06-09 DIAGNOSIS — R519 Headache, unspecified: Secondary | ICD-10-CM | POA: Diagnosis not present

## 2023-06-09 DIAGNOSIS — N186 End stage renal disease: Secondary | ICD-10-CM | POA: Diagnosis not present

## 2023-06-09 DIAGNOSIS — L299 Pruritus, unspecified: Secondary | ICD-10-CM | POA: Diagnosis not present

## 2023-06-09 DIAGNOSIS — Z992 Dependence on renal dialysis: Secondary | ICD-10-CM | POA: Diagnosis not present

## 2023-06-09 DIAGNOSIS — R197 Diarrhea, unspecified: Secondary | ICD-10-CM | POA: Diagnosis not present

## 2023-06-09 DIAGNOSIS — D631 Anemia in chronic kidney disease: Secondary | ICD-10-CM | POA: Diagnosis not present

## 2023-06-09 DIAGNOSIS — D689 Coagulation defect, unspecified: Secondary | ICD-10-CM | POA: Diagnosis not present

## 2023-06-09 NOTE — Progress Notes (Signed)
Patient reached out to state that she did not receive the package shipped on 10/21. Described the location in delivery photo which she states is correct and she believes it may have been stolen from the location. She has 3 days of medication on hand so pharmacy will re-ship medication today(to be delivered tomorrow 10/31) to new address provided:   [address] [place], [place] [address]

## 2023-06-10 ENCOUNTER — Other Ambulatory Visit: Payer: Self-pay

## 2023-06-10 DIAGNOSIS — E113393 Type 2 diabetes mellitus with moderate nonproliferative diabetic retinopathy without macular edema, bilateral: Secondary | ICD-10-CM | POA: Diagnosis not present

## 2023-06-10 DIAGNOSIS — E1122 Type 2 diabetes mellitus with diabetic chronic kidney disease: Secondary | ICD-10-CM | POA: Diagnosis not present

## 2023-06-10 DIAGNOSIS — Z89611 Acquired absence of right leg above knee: Secondary | ICD-10-CM | POA: Diagnosis not present

## 2023-06-10 DIAGNOSIS — L299 Pruritus, unspecified: Secondary | ICD-10-CM | POA: Diagnosis not present

## 2023-06-10 DIAGNOSIS — R519 Headache, unspecified: Secondary | ICD-10-CM | POA: Diagnosis not present

## 2023-06-10 DIAGNOSIS — K529 Noninfective gastroenteritis and colitis, unspecified: Secondary | ICD-10-CM | POA: Diagnosis not present

## 2023-06-10 DIAGNOSIS — K2100 Gastro-esophageal reflux disease with esophagitis, without bleeding: Secondary | ICD-10-CM | POA: Diagnosis not present

## 2023-06-10 DIAGNOSIS — R197 Diarrhea, unspecified: Secondary | ICD-10-CM | POA: Diagnosis not present

## 2023-06-10 DIAGNOSIS — K3184 Gastroparesis: Secondary | ICD-10-CM | POA: Diagnosis not present

## 2023-06-10 DIAGNOSIS — R569 Unspecified convulsions: Secondary | ICD-10-CM | POA: Diagnosis not present

## 2023-06-10 DIAGNOSIS — E1151 Type 2 diabetes mellitus with diabetic peripheral angiopathy without gangrene: Secondary | ICD-10-CM | POA: Diagnosis not present

## 2023-06-10 DIAGNOSIS — E785 Hyperlipidemia, unspecified: Secondary | ICD-10-CM | POA: Diagnosis not present

## 2023-06-10 DIAGNOSIS — Z992 Dependence on renal dialysis: Secondary | ICD-10-CM | POA: Diagnosis not present

## 2023-06-10 DIAGNOSIS — E1110 Type 2 diabetes mellitus with ketoacidosis without coma: Secondary | ICD-10-CM | POA: Diagnosis not present

## 2023-06-10 DIAGNOSIS — E1129 Type 2 diabetes mellitus with other diabetic kidney complication: Secondary | ICD-10-CM | POA: Diagnosis not present

## 2023-06-10 DIAGNOSIS — E1142 Type 2 diabetes mellitus with diabetic polyneuropathy: Secondary | ICD-10-CM | POA: Diagnosis not present

## 2023-06-10 DIAGNOSIS — Z6823 Body mass index (BMI) 23.0-23.9, adult: Secondary | ICD-10-CM | POA: Diagnosis not present

## 2023-06-10 DIAGNOSIS — E1143 Type 2 diabetes mellitus with diabetic autonomic (poly)neuropathy: Secondary | ICD-10-CM | POA: Diagnosis not present

## 2023-06-10 DIAGNOSIS — Z8673 Personal history of transient ischemic attack (TIA), and cerebral infarction without residual deficits: Secondary | ICD-10-CM | POA: Diagnosis not present

## 2023-06-10 DIAGNOSIS — I120 Hypertensive chronic kidney disease with stage 5 chronic kidney disease or end stage renal disease: Secondary | ICD-10-CM | POA: Diagnosis not present

## 2023-06-10 DIAGNOSIS — D62 Acute posthemorrhagic anemia: Secondary | ICD-10-CM | POA: Diagnosis not present

## 2023-06-10 DIAGNOSIS — N186 End stage renal disease: Secondary | ICD-10-CM | POA: Diagnosis not present

## 2023-06-10 DIAGNOSIS — D689 Coagulation defect, unspecified: Secondary | ICD-10-CM | POA: Diagnosis not present

## 2023-06-10 DIAGNOSIS — I2510 Atherosclerotic heart disease of native coronary artery without angina pectoris: Secondary | ICD-10-CM | POA: Diagnosis not present

## 2023-06-10 DIAGNOSIS — G43909 Migraine, unspecified, not intractable, without status migrainosus: Secondary | ICD-10-CM | POA: Diagnosis not present

## 2023-06-10 DIAGNOSIS — Z89612 Acquired absence of left leg above knee: Secondary | ICD-10-CM | POA: Diagnosis not present

## 2023-06-10 DIAGNOSIS — D631 Anemia in chronic kidney disease: Secondary | ICD-10-CM | POA: Diagnosis not present

## 2023-06-10 DIAGNOSIS — N2581 Secondary hyperparathyroidism of renal origin: Secondary | ICD-10-CM | POA: Diagnosis not present

## 2023-06-10 DIAGNOSIS — T8249XA Other complication of vascular dialysis catheter, initial encounter: Secondary | ICD-10-CM | POA: Diagnosis not present

## 2023-06-11 ENCOUNTER — Telehealth: Payer: Self-pay | Admitting: Student

## 2023-06-11 ENCOUNTER — Ambulatory Visit (INDEPENDENT_AMBULATORY_CARE_PROVIDER_SITE_OTHER): Payer: 59 | Admitting: Internal Medicine

## 2023-06-11 DIAGNOSIS — I10 Essential (primary) hypertension: Secondary | ICD-10-CM | POA: Diagnosis not present

## 2023-06-11 DIAGNOSIS — T879 Unspecified complications of amputation stump: Secondary | ICD-10-CM | POA: Diagnosis not present

## 2023-06-11 DIAGNOSIS — R2990 Unspecified symptoms and signs involving the nervous system: Secondary | ICD-10-CM | POA: Diagnosis not present

## 2023-06-11 NOTE — Telephone Encounter (Signed)
Rec'd call from the pt and [facility] Herbert Seta That the pt's BP was 203/106 before her Procedure today and 181/104 after her procedure.  Transferred the call to Angelina Ok RN  in Triage to speak with the pt in regards to her BP readings at the following office this morning  [facility] [place] Address: [address] [place], [place], [place] [address] Phone: [phone]

## 2023-06-11 NOTE — Assessment & Plan Note (Signed)
Patient called clinic with elevated blood pressure this morning of 203/106. She went to vascular surgery center for procedure on fistula (unable to review notes). Repeat blood pressure after procedure was 181/104. She was asymptomatic. She reports adherence with coreg 12.5 mg BID and amlodipine -olmesartan 10-40 mg. She went to HD on Tuesday and Thursday this week. Her nephrologist is Dr. Marisue Humble. P: Message sent to Dr. Marisue Humble Continue home blood pressure medications

## 2023-06-11 NOTE — Progress Notes (Signed)
   I connected with  Kathy Frank on 06/11/23 by telephone and verified that I am speaking with the correct person using two identifiers.   I discussed the limitations of evaluation and management by telemedicine. The patient expressed understanding and agreed to proceed.  CC: blood pressure  This is a telephone encounter between Kathy Frank and [name] on 06/11/2023 for elevated blood pressure. The visit was conducted with the patient located at home and Rudene Christians at [facility]. The patient's identity was confirmed using their DOB and current address. The patient has consented to being evaluated through a telephone encounter and understands the associated risks (an examination cannot be done and the patient may need to come in for an appointment) / benefits (allows the patient to remain at home, decreasing exposure to coronavirus). I personally spent 10 minutes on medical discussion.   HPI:  Kathy Frank is a 47 y.o. with PMH as below.   Please see A&P for assessment of the patient's acute and chronic medical conditions.   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 diabetic   GERD (gastroesophageal reflux disease)    Human immunodeficiency virus (HIV) disease (HCC) 04/23/2016   Hyperkalemia 10/12/2022   Hyperlipidemia    Hypertension     Hypertensive emergency 11/09/2021   Hypertensive urgency 05/20/2021   Metabolic bone disease 05/02/2019   Moderate nonproliferative diabetic retinopathy of both eyes (HCC) 11/21/2014   11/14/14: Noted on retinal imaging; needs follow-up imaging in 6 months  05/22/16: Noted on retinal imaging again; needs follow-up imaging in 6 months   PAD (peripheral artery disease) (HCC)    Type 2 diabetes mellitus with diabetic peripheral angiopathy without gangrene (HCC) 05/01/2019   Wound infection s/p L transmetatarsal amputation    Review of Systems:  denies headache, shortness of breath, chest pain, numbness or tingling  Assessment & Plan:   Essential hypertension Patient called clinic with elevated blood pressure this morning of 203/106. She went to vascular surgery center for procedure on fistula (unable to review notes). Repeat blood pressure after procedure was 181/104. She was asymptomatic. She reports adherence with coreg 12.5 mg BID and amlodipine -olmesartan 10-40 mg. She went to HD on Tuesday and Thursday this week. Her nephrologist is Dr. Marisue Humble. P: Message sent to Dr. Marisue Humble Continue home blood pressure medications   Patient discussed with Dr. Precious Bard Mecca Barga, D.O. [facility] Internal Medicine  PGY-3 Pager: [phone]  Phone: [phone] Date 06/11/2023  Time 8:46 PM

## 2023-06-12 DIAGNOSIS — T8249XA Other complication of vascular dialysis catheter, initial encounter: Secondary | ICD-10-CM | POA: Diagnosis not present

## 2023-06-12 DIAGNOSIS — I120 Hypertensive chronic kidney disease with stage 5 chronic kidney disease or end stage renal disease: Secondary | ICD-10-CM | POA: Diagnosis not present

## 2023-06-12 DIAGNOSIS — E8779 Other fluid overload: Secondary | ICD-10-CM | POA: Diagnosis not present

## 2023-06-12 DIAGNOSIS — R197 Diarrhea, unspecified: Secondary | ICD-10-CM | POA: Diagnosis not present

## 2023-06-12 DIAGNOSIS — D631 Anemia in chronic kidney disease: Secondary | ICD-10-CM | POA: Diagnosis not present

## 2023-06-12 DIAGNOSIS — Z992 Dependence on renal dialysis: Secondary | ICD-10-CM | POA: Diagnosis not present

## 2023-06-12 DIAGNOSIS — L299 Pruritus, unspecified: Secondary | ICD-10-CM | POA: Diagnosis not present

## 2023-06-12 DIAGNOSIS — R519 Headache, unspecified: Secondary | ICD-10-CM | POA: Diagnosis not present

## 2023-06-12 DIAGNOSIS — N2581 Secondary hyperparathyroidism of renal origin: Secondary | ICD-10-CM | POA: Diagnosis not present

## 2023-06-12 DIAGNOSIS — N186 End stage renal disease: Secondary | ICD-10-CM | POA: Diagnosis not present

## 2023-06-12 DIAGNOSIS — D689 Coagulation defect, unspecified: Secondary | ICD-10-CM | POA: Diagnosis not present

## 2023-06-14 NOTE — Progress Notes (Signed)
Internal Medicine Clinic Attending  Case discussed with Dr. Masters  At the time of the visit.  We reviewed the resident's history and exam and pertinent patient test results.  I agree with the assessment, diagnosis, and plan of care documented in the resident's note.  

## 2023-06-15 ENCOUNTER — Other Ambulatory Visit: Payer: Self-pay

## 2023-06-15 DIAGNOSIS — R519 Headache, unspecified: Secondary | ICD-10-CM | POA: Diagnosis not present

## 2023-06-15 DIAGNOSIS — D631 Anemia in chronic kidney disease: Secondary | ICD-10-CM | POA: Diagnosis not present

## 2023-06-15 DIAGNOSIS — T8249XA Other complication of vascular dialysis catheter, initial encounter: Secondary | ICD-10-CM | POA: Diagnosis not present

## 2023-06-15 DIAGNOSIS — R197 Diarrhea, unspecified: Secondary | ICD-10-CM | POA: Diagnosis not present

## 2023-06-15 DIAGNOSIS — I120 Hypertensive chronic kidney disease with stage 5 chronic kidney disease or end stage renal disease: Secondary | ICD-10-CM | POA: Diagnosis not present

## 2023-06-15 DIAGNOSIS — N2581 Secondary hyperparathyroidism of renal origin: Secondary | ICD-10-CM | POA: Diagnosis not present

## 2023-06-15 DIAGNOSIS — L299 Pruritus, unspecified: Secondary | ICD-10-CM | POA: Diagnosis not present

## 2023-06-15 DIAGNOSIS — D689 Coagulation defect, unspecified: Secondary | ICD-10-CM | POA: Diagnosis not present

## 2023-06-15 DIAGNOSIS — E8779 Other fluid overload: Secondary | ICD-10-CM | POA: Diagnosis not present

## 2023-06-15 DIAGNOSIS — N186 End stage renal disease: Secondary | ICD-10-CM | POA: Diagnosis not present

## 2023-06-15 DIAGNOSIS — Z992 Dependence on renal dialysis: Secondary | ICD-10-CM | POA: Diagnosis not present

## 2023-06-17 DIAGNOSIS — T8249XA Other complication of vascular dialysis catheter, initial encounter: Secondary | ICD-10-CM | POA: Diagnosis not present

## 2023-06-17 DIAGNOSIS — I120 Hypertensive chronic kidney disease with stage 5 chronic kidney disease or end stage renal disease: Secondary | ICD-10-CM | POA: Diagnosis not present

## 2023-06-17 DIAGNOSIS — N186 End stage renal disease: Secondary | ICD-10-CM | POA: Diagnosis not present

## 2023-06-17 DIAGNOSIS — E8779 Other fluid overload: Secondary | ICD-10-CM | POA: Diagnosis not present

## 2023-06-17 DIAGNOSIS — L299 Pruritus, unspecified: Secondary | ICD-10-CM | POA: Diagnosis not present

## 2023-06-17 DIAGNOSIS — N2581 Secondary hyperparathyroidism of renal origin: Secondary | ICD-10-CM | POA: Diagnosis not present

## 2023-06-17 DIAGNOSIS — D631 Anemia in chronic kidney disease: Secondary | ICD-10-CM | POA: Diagnosis not present

## 2023-06-17 DIAGNOSIS — D689 Coagulation defect, unspecified: Secondary | ICD-10-CM | POA: Diagnosis not present

## 2023-06-17 DIAGNOSIS — R197 Diarrhea, unspecified: Secondary | ICD-10-CM | POA: Diagnosis not present

## 2023-06-17 DIAGNOSIS — R519 Headache, unspecified: Secondary | ICD-10-CM | POA: Diagnosis not present

## 2023-06-17 DIAGNOSIS — Z992 Dependence on renal dialysis: Secondary | ICD-10-CM | POA: Diagnosis not present

## 2023-06-17 NOTE — Telephone Encounter (Signed)
Please advise rx refill 

## 2023-06-17 NOTE — Telephone Encounter (Signed)
Called patient and lvm for her to give [place] a call.

## 2023-06-19 DIAGNOSIS — I120 Hypertensive chronic kidney disease with stage 5 chronic kidney disease or end stage renal disease: Secondary | ICD-10-CM | POA: Diagnosis not present

## 2023-06-19 DIAGNOSIS — D631 Anemia in chronic kidney disease: Secondary | ICD-10-CM | POA: Diagnosis not present

## 2023-06-19 DIAGNOSIS — L299 Pruritus, unspecified: Secondary | ICD-10-CM | POA: Diagnosis not present

## 2023-06-19 DIAGNOSIS — N2581 Secondary hyperparathyroidism of renal origin: Secondary | ICD-10-CM | POA: Diagnosis not present

## 2023-06-19 DIAGNOSIS — D689 Coagulation defect, unspecified: Secondary | ICD-10-CM | POA: Diagnosis not present

## 2023-06-19 DIAGNOSIS — R197 Diarrhea, unspecified: Secondary | ICD-10-CM | POA: Diagnosis not present

## 2023-06-19 DIAGNOSIS — Z992 Dependence on renal dialysis: Secondary | ICD-10-CM | POA: Diagnosis not present

## 2023-06-19 DIAGNOSIS — N186 End stage renal disease: Secondary | ICD-10-CM | POA: Diagnosis not present

## 2023-06-19 DIAGNOSIS — T8249XA Other complication of vascular dialysis catheter, initial encounter: Secondary | ICD-10-CM | POA: Diagnosis not present

## 2023-06-19 DIAGNOSIS — R519 Headache, unspecified: Secondary | ICD-10-CM | POA: Diagnosis not present

## 2023-06-19 DIAGNOSIS — E8779 Other fluid overload: Secondary | ICD-10-CM | POA: Diagnosis not present

## 2023-06-22 DIAGNOSIS — E8779 Other fluid overload: Secondary | ICD-10-CM | POA: Diagnosis not present

## 2023-06-22 DIAGNOSIS — T8249XA Other complication of vascular dialysis catheter, initial encounter: Secondary | ICD-10-CM | POA: Diagnosis not present

## 2023-06-22 DIAGNOSIS — N186 End stage renal disease: Secondary | ICD-10-CM | POA: Diagnosis not present

## 2023-06-22 DIAGNOSIS — R519 Headache, unspecified: Secondary | ICD-10-CM | POA: Diagnosis not present

## 2023-06-22 DIAGNOSIS — I120 Hypertensive chronic kidney disease with stage 5 chronic kidney disease or end stage renal disease: Secondary | ICD-10-CM | POA: Diagnosis not present

## 2023-06-22 DIAGNOSIS — D631 Anemia in chronic kidney disease: Secondary | ICD-10-CM | POA: Diagnosis not present

## 2023-06-22 DIAGNOSIS — R197 Diarrhea, unspecified: Secondary | ICD-10-CM | POA: Diagnosis not present

## 2023-06-22 DIAGNOSIS — N2581 Secondary hyperparathyroidism of renal origin: Secondary | ICD-10-CM | POA: Diagnosis not present

## 2023-06-22 DIAGNOSIS — D689 Coagulation defect, unspecified: Secondary | ICD-10-CM | POA: Diagnosis not present

## 2023-06-22 DIAGNOSIS — L299 Pruritus, unspecified: Secondary | ICD-10-CM | POA: Diagnosis not present

## 2023-06-22 DIAGNOSIS — Z992 Dependence on renal dialysis: Secondary | ICD-10-CM | POA: Diagnosis not present

## 2023-06-24 ENCOUNTER — Ambulatory Visit: Payer: Self-pay

## 2023-06-24 DIAGNOSIS — Z992 Dependence on renal dialysis: Secondary | ICD-10-CM | POA: Diagnosis not present

## 2023-06-24 DIAGNOSIS — I120 Hypertensive chronic kidney disease with stage 5 chronic kidney disease or end stage renal disease: Secondary | ICD-10-CM | POA: Diagnosis not present

## 2023-06-24 DIAGNOSIS — D631 Anemia in chronic kidney disease: Secondary | ICD-10-CM | POA: Diagnosis not present

## 2023-06-24 DIAGNOSIS — L299 Pruritus, unspecified: Secondary | ICD-10-CM | POA: Diagnosis not present

## 2023-06-24 DIAGNOSIS — R519 Headache, unspecified: Secondary | ICD-10-CM | POA: Diagnosis not present

## 2023-06-24 DIAGNOSIS — N2581 Secondary hyperparathyroidism of renal origin: Secondary | ICD-10-CM | POA: Diagnosis not present

## 2023-06-24 DIAGNOSIS — R197 Diarrhea, unspecified: Secondary | ICD-10-CM | POA: Diagnosis not present

## 2023-06-24 DIAGNOSIS — E8779 Other fluid overload: Secondary | ICD-10-CM | POA: Diagnosis not present

## 2023-06-24 DIAGNOSIS — N186 End stage renal disease: Secondary | ICD-10-CM | POA: Diagnosis not present

## 2023-06-24 DIAGNOSIS — D689 Coagulation defect, unspecified: Secondary | ICD-10-CM | POA: Diagnosis not present

## 2023-06-24 DIAGNOSIS — T8249XA Other complication of vascular dialysis catheter, initial encounter: Secondary | ICD-10-CM | POA: Diagnosis not present

## 2023-06-24 NOTE — Patient Outreach (Signed)
Care Coordination   Follow Up Visit Note   06/24/2023 Name: Kathy Frank MRN: [id number] DOB: [birth year]  Kathy Frank is a 47 y.o. year old female who sees Tawkaliyar, Roya, DO for primary care. I spoke with  [name] by phone today.  What matters to the patients health and wellness today?  I had a conversation with Kathy Frank today, during which she reported that she has received the cushion for her wheelchair. She is actively working to maintain her fluid intake; however, she has experienced elevated blood pressure readings, reaching the 200s. Subsequently, she contacted her physician, who inquired about the presence of any symptoms, such as headaches or shortness of breath. Kathy Frank indicated that while she suffers from migraines and has medication to manage them, she has not experienced any other symptoms. Nonetheless, she did report having had slight shortness of breath recently. When I asked whether she had experienced any chest pain, she denied it. In light of her elevated blood pressure, which has been recorded at over 200/100, I advised her to exercise caution and to be vigilant for signs of a stroke. We reviewed the BEFAST acronym to help her identify potential symptoms, and I emphasized the importance of calling 911 should such symptoms arise. Kathy Frank does not currently possess a blood pressure monitor for home use; however, she receives financial assistance from her insurance to purchase necessary items. I recommended that she contact [facility] to obtain a monitor and arrange for its delivery to her residence. She agreed to take this action following our discussion. Additionally, we addressed her vaccination status, and she confirmed that she has already received both her flu shot and the COVID-19 vaccine.    Goals Addressed             This Visit's Progress    CCM Expected Outcome:  Monitor, Self-Manage, and Reduce Symptoms of Hypertension        Patient Goals/Self Care Activities: -Patient/Caregiver will take medications as prescribed   -Patient/Caregiver will attend all scheduled provider appointments -Patient/Caregiver will call pharmacy for medication refills 3-7 days in advance of running out of medications -Patient/Caregiver will call provider office for new concerns or questions  -Patient/Caregiver will focus on medication adherence by taking medications as prescribed   Last practice recorded BP readings:  BP Readings from Last 3 Encounters:  05/21/23 (!) 151/77  05/18/23 (!) 207/95  05/10/23 (!) 179/57   Provided education to patient re: stroke prevention, s/s of heart attack and stroke Reviewed medications with patient and discussed importance of compliance Discussed complications of poorly controlled blood pressure such as heart disease, stroke, circulatory complications, vision complications, kidney impairment, sexual dysfunction Discussed the purchase of blood pressure monitor         SDOH assessments and interventions completed:  No     Care Coordination Interventions:  Yes, provided   Interventions Today    Flowsheet Row Most Recent Value  Chronic Disease   Chronic disease during today's visit Hypertension (HTN)  General Interventions   General Interventions Discussed/Reviewed Vaccines  Vaccines COVID-19, Flu  Durable Medical Equipment (DME) BP Cuff  Pharmacy Interventions   Pharmacy Dicussed/Reviewed Pharmacy Topics Discussed  Safety Interventions   Safety Discussed/Reviewed Safety Discussed        Follow up plan: Follow up call scheduled for 07/28/23  2 pm    Encounter Outcome:  Patient Visit Completed   Juanell Fairly RN, BSN, [facility]  [facility], Population  Health  Care Coordinator Phone: [phone]

## 2023-06-24 NOTE — Patient Instructions (Signed)
Visit Information  Thank you for taking time to visit with me today. Please don't hesitate to contact me if I can be of assistance to you.   Following are the goals we discussed today:   Goals Addressed             This Visit's Progress    CCM Expected Outcome:  Monitor, Self-Manage, and Reduce Symptoms of Hypertension       Patient Goals/Self Care Activities: -Patient/Caregiver will take medications as prescribed   -Patient/Caregiver will attend all scheduled provider appointments -Patient/Caregiver will call pharmacy for medication refills 3-7 days in advance of running out of medications -Patient/Caregiver will call provider office for new concerns or questions  -Patient/Caregiver will focus on medication adherence by taking medications as prescribed   Last practice recorded BP readings:  BP Readings from Last 3 Encounters:  05/21/23 (!) 151/77  05/18/23 (!) 207/95  05/10/23 (!) 179/57   Provided education to patient re: stroke prevention, s/s of heart attack and stroke Reviewed medications with patient and discussed importance of compliance Discussed complications of poorly controlled blood pressure such as heart disease, stroke, circulatory complications, vision complications, kidney impairment, sexual dysfunction Discussed the purchase of blood pressure monitor         Our next appointment is by telephone on 07/28/23 at 2  pm  Please call the care guide team at [phone] if you need to cancel or reschedule your appointment.   If you are experiencing a Mental Health or Behavioral Health Crisis or need someone to talk to, please call 1-800-273-TALK (toll free, 24 hour hotline)  The patient verbalized understanding of instructions, educational materials, and care plan provided today and agreed to receive a mailed copy of patient instructions, educational materials, and care plan.   Juanell Fairly RN, BSN, [facility]  [facility], [facility]   Care Coordinator Phone: [phone]

## 2023-06-25 DIAGNOSIS — Z992 Dependence on renal dialysis: Secondary | ICD-10-CM | POA: Diagnosis not present

## 2023-06-25 DIAGNOSIS — N186 End stage renal disease: Secondary | ICD-10-CM | POA: Diagnosis not present

## 2023-06-25 DIAGNOSIS — Z452 Encounter for adjustment and management of vascular access device: Secondary | ICD-10-CM | POA: Diagnosis not present

## 2023-06-26 DIAGNOSIS — E8779 Other fluid overload: Secondary | ICD-10-CM | POA: Diagnosis not present

## 2023-06-26 DIAGNOSIS — D631 Anemia in chronic kidney disease: Secondary | ICD-10-CM | POA: Diagnosis not present

## 2023-06-26 DIAGNOSIS — N186 End stage renal disease: Secondary | ICD-10-CM | POA: Diagnosis not present

## 2023-06-26 DIAGNOSIS — R519 Headache, unspecified: Secondary | ICD-10-CM | POA: Diagnosis not present

## 2023-06-26 DIAGNOSIS — L299 Pruritus, unspecified: Secondary | ICD-10-CM | POA: Diagnosis not present

## 2023-06-26 DIAGNOSIS — Z992 Dependence on renal dialysis: Secondary | ICD-10-CM | POA: Diagnosis not present

## 2023-06-26 DIAGNOSIS — D689 Coagulation defect, unspecified: Secondary | ICD-10-CM | POA: Diagnosis not present

## 2023-06-26 DIAGNOSIS — N2581 Secondary hyperparathyroidism of renal origin: Secondary | ICD-10-CM | POA: Diagnosis not present

## 2023-06-26 DIAGNOSIS — I120 Hypertensive chronic kidney disease with stage 5 chronic kidney disease or end stage renal disease: Secondary | ICD-10-CM | POA: Diagnosis not present

## 2023-06-26 DIAGNOSIS — R197 Diarrhea, unspecified: Secondary | ICD-10-CM | POA: Diagnosis not present

## 2023-06-26 DIAGNOSIS — T8249XA Other complication of vascular dialysis catheter, initial encounter: Secondary | ICD-10-CM | POA: Diagnosis not present

## 2023-06-28 DIAGNOSIS — D689 Coagulation defect, unspecified: Secondary | ICD-10-CM | POA: Diagnosis not present

## 2023-06-28 DIAGNOSIS — L299 Pruritus, unspecified: Secondary | ICD-10-CM | POA: Diagnosis not present

## 2023-06-28 DIAGNOSIS — I120 Hypertensive chronic kidney disease with stage 5 chronic kidney disease or end stage renal disease: Secondary | ICD-10-CM | POA: Diagnosis not present

## 2023-06-28 DIAGNOSIS — T8249XA Other complication of vascular dialysis catheter, initial encounter: Secondary | ICD-10-CM | POA: Diagnosis not present

## 2023-06-28 DIAGNOSIS — Z992 Dependence on renal dialysis: Secondary | ICD-10-CM | POA: Diagnosis not present

## 2023-06-28 DIAGNOSIS — R519 Headache, unspecified: Secondary | ICD-10-CM | POA: Diagnosis not present

## 2023-06-28 DIAGNOSIS — D631 Anemia in chronic kidney disease: Secondary | ICD-10-CM | POA: Diagnosis not present

## 2023-06-28 DIAGNOSIS — E8779 Other fluid overload: Secondary | ICD-10-CM | POA: Diagnosis not present

## 2023-06-28 DIAGNOSIS — R197 Diarrhea, unspecified: Secondary | ICD-10-CM | POA: Diagnosis not present

## 2023-06-28 DIAGNOSIS — N186 End stage renal disease: Secondary | ICD-10-CM | POA: Diagnosis not present

## 2023-06-28 DIAGNOSIS — N2581 Secondary hyperparathyroidism of renal origin: Secondary | ICD-10-CM | POA: Diagnosis not present

## 2023-06-29 DIAGNOSIS — I120 Hypertensive chronic kidney disease with stage 5 chronic kidney disease or end stage renal disease: Secondary | ICD-10-CM | POA: Diagnosis not present

## 2023-06-29 DIAGNOSIS — R519 Headache, unspecified: Secondary | ICD-10-CM | POA: Diagnosis not present

## 2023-06-29 DIAGNOSIS — N2581 Secondary hyperparathyroidism of renal origin: Secondary | ICD-10-CM | POA: Diagnosis not present

## 2023-06-29 DIAGNOSIS — Z992 Dependence on renal dialysis: Secondary | ICD-10-CM | POA: Diagnosis not present

## 2023-06-29 DIAGNOSIS — N186 End stage renal disease: Secondary | ICD-10-CM | POA: Diagnosis not present

## 2023-06-29 DIAGNOSIS — E8779 Other fluid overload: Secondary | ICD-10-CM | POA: Diagnosis not present

## 2023-06-29 DIAGNOSIS — D631 Anemia in chronic kidney disease: Secondary | ICD-10-CM | POA: Diagnosis not present

## 2023-06-29 DIAGNOSIS — D689 Coagulation defect, unspecified: Secondary | ICD-10-CM | POA: Diagnosis not present

## 2023-06-29 DIAGNOSIS — L299 Pruritus, unspecified: Secondary | ICD-10-CM | POA: Diagnosis not present

## 2023-06-29 DIAGNOSIS — R197 Diarrhea, unspecified: Secondary | ICD-10-CM | POA: Diagnosis not present

## 2023-06-29 DIAGNOSIS — T8249XA Other complication of vascular dialysis catheter, initial encounter: Secondary | ICD-10-CM | POA: Diagnosis not present

## 2023-06-30 ENCOUNTER — Other Ambulatory Visit: Payer: Self-pay

## 2023-06-30 NOTE — Progress Notes (Signed)
Specialty Pharmacy Refill Coordination Note  Kathy Frank is a 46 y.o. female contacted today regarding refills of specialty medication(s) Bictegravir-Emtricitab-Tenofov   Patient requested Delivery   Delivery date: 07/02/23   Verified address: [address] [place] [address]   Medication will be filled on 07/01/23.

## 2023-07-01 ENCOUNTER — Other Ambulatory Visit (HOSPITAL_COMMUNITY): Payer: Self-pay

## 2023-07-01 ENCOUNTER — Other Ambulatory Visit: Payer: Self-pay

## 2023-07-01 DIAGNOSIS — R519 Headache, unspecified: Secondary | ICD-10-CM | POA: Diagnosis not present

## 2023-07-01 DIAGNOSIS — L299 Pruritus, unspecified: Secondary | ICD-10-CM | POA: Diagnosis not present

## 2023-07-01 DIAGNOSIS — R197 Diarrhea, unspecified: Secondary | ICD-10-CM | POA: Diagnosis not present

## 2023-07-01 DIAGNOSIS — N2581 Secondary hyperparathyroidism of renal origin: Secondary | ICD-10-CM | POA: Diagnosis not present

## 2023-07-01 DIAGNOSIS — I120 Hypertensive chronic kidney disease with stage 5 chronic kidney disease or end stage renal disease: Secondary | ICD-10-CM | POA: Diagnosis not present

## 2023-07-01 DIAGNOSIS — Z992 Dependence on renal dialysis: Secondary | ICD-10-CM | POA: Diagnosis not present

## 2023-07-01 DIAGNOSIS — D631 Anemia in chronic kidney disease: Secondary | ICD-10-CM | POA: Diagnosis not present

## 2023-07-01 DIAGNOSIS — E8779 Other fluid overload: Secondary | ICD-10-CM | POA: Diagnosis not present

## 2023-07-01 DIAGNOSIS — N186 End stage renal disease: Secondary | ICD-10-CM | POA: Diagnosis not present

## 2023-07-01 DIAGNOSIS — T8249XA Other complication of vascular dialysis catheter, initial encounter: Secondary | ICD-10-CM | POA: Diagnosis not present

## 2023-07-01 DIAGNOSIS — D689 Coagulation defect, unspecified: Secondary | ICD-10-CM | POA: Diagnosis not present

## 2023-07-02 ENCOUNTER — Ambulatory Visit: Payer: 59

## 2023-07-03 DIAGNOSIS — E8779 Other fluid overload: Secondary | ICD-10-CM | POA: Diagnosis not present

## 2023-07-03 DIAGNOSIS — R197 Diarrhea, unspecified: Secondary | ICD-10-CM | POA: Diagnosis not present

## 2023-07-03 DIAGNOSIS — N186 End stage renal disease: Secondary | ICD-10-CM | POA: Diagnosis not present

## 2023-07-03 DIAGNOSIS — R519 Headache, unspecified: Secondary | ICD-10-CM | POA: Diagnosis not present

## 2023-07-03 DIAGNOSIS — I120 Hypertensive chronic kidney disease with stage 5 chronic kidney disease or end stage renal disease: Secondary | ICD-10-CM | POA: Diagnosis not present

## 2023-07-03 DIAGNOSIS — Z992 Dependence on renal dialysis: Secondary | ICD-10-CM | POA: Diagnosis not present

## 2023-07-03 DIAGNOSIS — D631 Anemia in chronic kidney disease: Secondary | ICD-10-CM | POA: Diagnosis not present

## 2023-07-03 DIAGNOSIS — L299 Pruritus, unspecified: Secondary | ICD-10-CM | POA: Diagnosis not present

## 2023-07-03 DIAGNOSIS — N2581 Secondary hyperparathyroidism of renal origin: Secondary | ICD-10-CM | POA: Diagnosis not present

## 2023-07-03 DIAGNOSIS — D689 Coagulation defect, unspecified: Secondary | ICD-10-CM | POA: Diagnosis not present

## 2023-07-03 DIAGNOSIS — T8249XA Other complication of vascular dialysis catheter, initial encounter: Secondary | ICD-10-CM | POA: Diagnosis not present

## 2023-07-05 ENCOUNTER — Telehealth: Payer: Self-pay

## 2023-07-05 ENCOUNTER — Ambulatory Visit: Payer: 59

## 2023-07-05 DIAGNOSIS — D631 Anemia in chronic kidney disease: Secondary | ICD-10-CM | POA: Diagnosis not present

## 2023-07-05 DIAGNOSIS — I120 Hypertensive chronic kidney disease with stage 5 chronic kidney disease or end stage renal disease: Secondary | ICD-10-CM | POA: Diagnosis not present

## 2023-07-05 DIAGNOSIS — N186 End stage renal disease: Secondary | ICD-10-CM | POA: Diagnosis not present

## 2023-07-05 DIAGNOSIS — T8249XA Other complication of vascular dialysis catheter, initial encounter: Secondary | ICD-10-CM | POA: Diagnosis not present

## 2023-07-05 DIAGNOSIS — E8779 Other fluid overload: Secondary | ICD-10-CM | POA: Diagnosis not present

## 2023-07-05 DIAGNOSIS — R519 Headache, unspecified: Secondary | ICD-10-CM | POA: Diagnosis not present

## 2023-07-05 DIAGNOSIS — L299 Pruritus, unspecified: Secondary | ICD-10-CM | POA: Diagnosis not present

## 2023-07-05 DIAGNOSIS — R197 Diarrhea, unspecified: Secondary | ICD-10-CM | POA: Diagnosis not present

## 2023-07-05 DIAGNOSIS — D689 Coagulation defect, unspecified: Secondary | ICD-10-CM | POA: Diagnosis not present

## 2023-07-05 DIAGNOSIS — Z992 Dependence on renal dialysis: Secondary | ICD-10-CM | POA: Diagnosis not present

## 2023-07-05 DIAGNOSIS — N2581 Secondary hyperparathyroidism of renal origin: Secondary | ICD-10-CM | POA: Diagnosis not present

## 2023-07-05 NOTE — Patient Outreach (Signed)
Attempted to contact patient regarding care gaps. Left voicemail for patient to return my call at [phone].  Nicholes Rough, CMA Care Guide [employer]

## 2023-07-07 DIAGNOSIS — L299 Pruritus, unspecified: Secondary | ICD-10-CM | POA: Diagnosis not present

## 2023-07-07 DIAGNOSIS — Z992 Dependence on renal dialysis: Secondary | ICD-10-CM | POA: Diagnosis not present

## 2023-07-07 DIAGNOSIS — N2581 Secondary hyperparathyroidism of renal origin: Secondary | ICD-10-CM | POA: Diagnosis not present

## 2023-07-07 DIAGNOSIS — T8249XA Other complication of vascular dialysis catheter, initial encounter: Secondary | ICD-10-CM | POA: Diagnosis not present

## 2023-07-07 DIAGNOSIS — D631 Anemia in chronic kidney disease: Secondary | ICD-10-CM | POA: Diagnosis not present

## 2023-07-07 DIAGNOSIS — D689 Coagulation defect, unspecified: Secondary | ICD-10-CM | POA: Diagnosis not present

## 2023-07-07 DIAGNOSIS — N186 End stage renal disease: Secondary | ICD-10-CM | POA: Diagnosis not present

## 2023-07-07 DIAGNOSIS — R519 Headache, unspecified: Secondary | ICD-10-CM | POA: Diagnosis not present

## 2023-07-07 DIAGNOSIS — Z89612 Acquired absence of left leg above knee: Secondary | ICD-10-CM | POA: Diagnosis not present

## 2023-07-07 DIAGNOSIS — E8779 Other fluid overload: Secondary | ICD-10-CM | POA: Diagnosis not present

## 2023-07-07 DIAGNOSIS — T879 Unspecified complications of amputation stump: Secondary | ICD-10-CM | POA: Diagnosis not present

## 2023-07-07 DIAGNOSIS — I120 Hypertensive chronic kidney disease with stage 5 chronic kidney disease or end stage renal disease: Secondary | ICD-10-CM | POA: Diagnosis not present

## 2023-07-07 DIAGNOSIS — R197 Diarrhea, unspecified: Secondary | ICD-10-CM | POA: Diagnosis not present

## 2023-07-10 DIAGNOSIS — D631 Anemia in chronic kidney disease: Secondary | ICD-10-CM | POA: Diagnosis not present

## 2023-07-10 DIAGNOSIS — D689 Coagulation defect, unspecified: Secondary | ICD-10-CM | POA: Diagnosis not present

## 2023-07-10 DIAGNOSIS — N186 End stage renal disease: Secondary | ICD-10-CM | POA: Diagnosis not present

## 2023-07-10 DIAGNOSIS — Z992 Dependence on renal dialysis: Secondary | ICD-10-CM | POA: Diagnosis not present

## 2023-07-10 DIAGNOSIS — R519 Headache, unspecified: Secondary | ICD-10-CM | POA: Diagnosis not present

## 2023-07-10 DIAGNOSIS — L299 Pruritus, unspecified: Secondary | ICD-10-CM | POA: Diagnosis not present

## 2023-07-10 DIAGNOSIS — E1129 Type 2 diabetes mellitus with other diabetic kidney complication: Secondary | ICD-10-CM | POA: Diagnosis not present

## 2023-07-10 DIAGNOSIS — R197 Diarrhea, unspecified: Secondary | ICD-10-CM | POA: Diagnosis not present

## 2023-07-10 DIAGNOSIS — E8779 Other fluid overload: Secondary | ICD-10-CM | POA: Diagnosis not present

## 2023-07-10 DIAGNOSIS — N2581 Secondary hyperparathyroidism of renal origin: Secondary | ICD-10-CM | POA: Diagnosis not present

## 2023-07-10 DIAGNOSIS — T8249XA Other complication of vascular dialysis catheter, initial encounter: Secondary | ICD-10-CM | POA: Diagnosis not present

## 2023-07-10 DIAGNOSIS — I120 Hypertensive chronic kidney disease with stage 5 chronic kidney disease or end stage renal disease: Secondary | ICD-10-CM | POA: Diagnosis not present

## 2023-07-13 DIAGNOSIS — N186 End stage renal disease: Secondary | ICD-10-CM | POA: Diagnosis not present

## 2023-07-13 DIAGNOSIS — Z992 Dependence on renal dialysis: Secondary | ICD-10-CM | POA: Diagnosis not present

## 2023-07-13 DIAGNOSIS — R197 Diarrhea, unspecified: Secondary | ICD-10-CM | POA: Diagnosis not present

## 2023-07-13 DIAGNOSIS — D631 Anemia in chronic kidney disease: Secondary | ICD-10-CM | POA: Diagnosis not present

## 2023-07-13 DIAGNOSIS — R519 Headache, unspecified: Secondary | ICD-10-CM | POA: Diagnosis not present

## 2023-07-13 DIAGNOSIS — N2581 Secondary hyperparathyroidism of renal origin: Secondary | ICD-10-CM | POA: Diagnosis not present

## 2023-07-13 DIAGNOSIS — E1129 Type 2 diabetes mellitus with other diabetic kidney complication: Secondary | ICD-10-CM | POA: Diagnosis not present

## 2023-07-13 DIAGNOSIS — E8779 Other fluid overload: Secondary | ICD-10-CM | POA: Diagnosis not present

## 2023-07-13 DIAGNOSIS — L299 Pruritus, unspecified: Secondary | ICD-10-CM | POA: Diagnosis not present

## 2023-07-15 DIAGNOSIS — L299 Pruritus, unspecified: Secondary | ICD-10-CM | POA: Diagnosis not present

## 2023-07-15 DIAGNOSIS — N2581 Secondary hyperparathyroidism of renal origin: Secondary | ICD-10-CM | POA: Diagnosis not present

## 2023-07-15 DIAGNOSIS — D631 Anemia in chronic kidney disease: Secondary | ICD-10-CM | POA: Diagnosis not present

## 2023-07-15 DIAGNOSIS — R197 Diarrhea, unspecified: Secondary | ICD-10-CM | POA: Diagnosis not present

## 2023-07-15 DIAGNOSIS — E1129 Type 2 diabetes mellitus with other diabetic kidney complication: Secondary | ICD-10-CM | POA: Diagnosis not present

## 2023-07-15 DIAGNOSIS — E8779 Other fluid overload: Secondary | ICD-10-CM | POA: Diagnosis not present

## 2023-07-15 DIAGNOSIS — Z992 Dependence on renal dialysis: Secondary | ICD-10-CM | POA: Diagnosis not present

## 2023-07-15 DIAGNOSIS — N186 End stage renal disease: Secondary | ICD-10-CM | POA: Diagnosis not present

## 2023-07-15 DIAGNOSIS — R519 Headache, unspecified: Secondary | ICD-10-CM | POA: Diagnosis not present

## 2023-07-16 DIAGNOSIS — R197 Diarrhea, unspecified: Secondary | ICD-10-CM | POA: Diagnosis not present

## 2023-07-16 DIAGNOSIS — E1129 Type 2 diabetes mellitus with other diabetic kidney complication: Secondary | ICD-10-CM | POA: Diagnosis not present

## 2023-07-16 DIAGNOSIS — R519 Headache, unspecified: Secondary | ICD-10-CM | POA: Diagnosis not present

## 2023-07-16 DIAGNOSIS — L299 Pruritus, unspecified: Secondary | ICD-10-CM | POA: Diagnosis not present

## 2023-07-16 DIAGNOSIS — N186 End stage renal disease: Secondary | ICD-10-CM | POA: Diagnosis not present

## 2023-07-16 DIAGNOSIS — N2581 Secondary hyperparathyroidism of renal origin: Secondary | ICD-10-CM | POA: Diagnosis not present

## 2023-07-16 DIAGNOSIS — D631 Anemia in chronic kidney disease: Secondary | ICD-10-CM | POA: Diagnosis not present

## 2023-07-16 DIAGNOSIS — Z992 Dependence on renal dialysis: Secondary | ICD-10-CM | POA: Diagnosis not present

## 2023-07-16 DIAGNOSIS — E8779 Other fluid overload: Secondary | ICD-10-CM | POA: Diagnosis not present

## 2023-07-17 DIAGNOSIS — D631 Anemia in chronic kidney disease: Secondary | ICD-10-CM | POA: Diagnosis not present

## 2023-07-17 DIAGNOSIS — N186 End stage renal disease: Secondary | ICD-10-CM | POA: Diagnosis not present

## 2023-07-17 DIAGNOSIS — Z992 Dependence on renal dialysis: Secondary | ICD-10-CM | POA: Diagnosis not present

## 2023-07-17 DIAGNOSIS — E1129 Type 2 diabetes mellitus with other diabetic kidney complication: Secondary | ICD-10-CM | POA: Diagnosis not present

## 2023-07-17 DIAGNOSIS — L299 Pruritus, unspecified: Secondary | ICD-10-CM | POA: Diagnosis not present

## 2023-07-17 DIAGNOSIS — E8779 Other fluid overload: Secondary | ICD-10-CM | POA: Diagnosis not present

## 2023-07-17 DIAGNOSIS — R519 Headache, unspecified: Secondary | ICD-10-CM | POA: Diagnosis not present

## 2023-07-17 DIAGNOSIS — R197 Diarrhea, unspecified: Secondary | ICD-10-CM | POA: Diagnosis not present

## 2023-07-17 DIAGNOSIS — N2581 Secondary hyperparathyroidism of renal origin: Secondary | ICD-10-CM | POA: Diagnosis not present

## 2023-07-20 DIAGNOSIS — Z992 Dependence on renal dialysis: Secondary | ICD-10-CM | POA: Diagnosis not present

## 2023-07-20 DIAGNOSIS — D631 Anemia in chronic kidney disease: Secondary | ICD-10-CM | POA: Diagnosis not present

## 2023-07-20 DIAGNOSIS — N186 End stage renal disease: Secondary | ICD-10-CM | POA: Diagnosis not present

## 2023-07-20 DIAGNOSIS — E8779 Other fluid overload: Secondary | ICD-10-CM | POA: Diagnosis not present

## 2023-07-20 DIAGNOSIS — E1129 Type 2 diabetes mellitus with other diabetic kidney complication: Secondary | ICD-10-CM | POA: Diagnosis not present

## 2023-07-20 DIAGNOSIS — R197 Diarrhea, unspecified: Secondary | ICD-10-CM | POA: Diagnosis not present

## 2023-07-20 DIAGNOSIS — L299 Pruritus, unspecified: Secondary | ICD-10-CM | POA: Diagnosis not present

## 2023-07-20 DIAGNOSIS — N2581 Secondary hyperparathyroidism of renal origin: Secondary | ICD-10-CM | POA: Diagnosis not present

## 2023-07-20 DIAGNOSIS — R519 Headache, unspecified: Secondary | ICD-10-CM | POA: Diagnosis not present

## 2023-07-22 ENCOUNTER — Other Ambulatory Visit: Payer: Self-pay

## 2023-07-22 DIAGNOSIS — D631 Anemia in chronic kidney disease: Secondary | ICD-10-CM | POA: Diagnosis not present

## 2023-07-22 DIAGNOSIS — R519 Headache, unspecified: Secondary | ICD-10-CM | POA: Diagnosis not present

## 2023-07-22 DIAGNOSIS — Z992 Dependence on renal dialysis: Secondary | ICD-10-CM | POA: Diagnosis not present

## 2023-07-22 DIAGNOSIS — L299 Pruritus, unspecified: Secondary | ICD-10-CM | POA: Diagnosis not present

## 2023-07-22 DIAGNOSIS — E8779 Other fluid overload: Secondary | ICD-10-CM | POA: Diagnosis not present

## 2023-07-22 DIAGNOSIS — E1129 Type 2 diabetes mellitus with other diabetic kidney complication: Secondary | ICD-10-CM | POA: Diagnosis not present

## 2023-07-22 DIAGNOSIS — N2581 Secondary hyperparathyroidism of renal origin: Secondary | ICD-10-CM | POA: Diagnosis not present

## 2023-07-22 DIAGNOSIS — R197 Diarrhea, unspecified: Secondary | ICD-10-CM | POA: Diagnosis not present

## 2023-07-22 DIAGNOSIS — N186 End stage renal disease: Secondary | ICD-10-CM | POA: Diagnosis not present

## 2023-07-23 ENCOUNTER — Other Ambulatory Visit: Payer: Self-pay | Admitting: Student

## 2023-07-24 DIAGNOSIS — R519 Headache, unspecified: Secondary | ICD-10-CM | POA: Diagnosis not present

## 2023-07-24 DIAGNOSIS — E8779 Other fluid overload: Secondary | ICD-10-CM | POA: Diagnosis not present

## 2023-07-24 DIAGNOSIS — N2581 Secondary hyperparathyroidism of renal origin: Secondary | ICD-10-CM | POA: Diagnosis not present

## 2023-07-24 DIAGNOSIS — L299 Pruritus, unspecified: Secondary | ICD-10-CM | POA: Diagnosis not present

## 2023-07-24 DIAGNOSIS — E1129 Type 2 diabetes mellitus with other diabetic kidney complication: Secondary | ICD-10-CM | POA: Diagnosis not present

## 2023-07-24 DIAGNOSIS — D631 Anemia in chronic kidney disease: Secondary | ICD-10-CM | POA: Diagnosis not present

## 2023-07-24 DIAGNOSIS — N186 End stage renal disease: Secondary | ICD-10-CM | POA: Diagnosis not present

## 2023-07-24 DIAGNOSIS — R197 Diarrhea, unspecified: Secondary | ICD-10-CM | POA: Diagnosis not present

## 2023-07-24 DIAGNOSIS — Z992 Dependence on renal dialysis: Secondary | ICD-10-CM | POA: Diagnosis not present

## 2023-07-26 MED ORDER — AMLODIPINE-OLMESARTAN 10-40 MG PO TABS: 1.0000 | ORAL_TABLET | Freq: Every evening | ORAL | 0 refills | Status: DC

## 2023-07-26 MED ORDER — ESCITALOPRAM OXALATE 5 MG PO TABS: 5.0000 mg | ORAL_TABLET | Freq: Every day | ORAL | 2 refills | Status: DC

## 2023-07-26 MED ORDER — CARVEDILOL 12.5 MG PO TABS: 12.5000 mg | ORAL_TABLET | Freq: Two times a day (BID) | ORAL | 0 refills | Status: DC

## 2023-07-26 MED ORDER — UBRELVY 50 MG PO TABS: 50.0000 mg | ORAL_TABLET | ORAL | 3 refills | Status: DC | PRN

## 2023-07-26 MED ORDER — CYCLOBENZAPRINE HCL 5 MG PO TABS: 5.0000 mg | ORAL_TABLET | Freq: Two times a day (BID) | ORAL | 1 refills | Status: AC | PRN

## 2023-07-27 DIAGNOSIS — T879 Unspecified complications of amputation stump: Secondary | ICD-10-CM | POA: Diagnosis not present

## 2023-07-27 DIAGNOSIS — E1129 Type 2 diabetes mellitus with other diabetic kidney complication: Secondary | ICD-10-CM | POA: Diagnosis not present

## 2023-07-27 DIAGNOSIS — R519 Headache, unspecified: Secondary | ICD-10-CM | POA: Diagnosis not present

## 2023-07-27 DIAGNOSIS — E8779 Other fluid overload: Secondary | ICD-10-CM | POA: Diagnosis not present

## 2023-07-27 DIAGNOSIS — N2581 Secondary hyperparathyroidism of renal origin: Secondary | ICD-10-CM | POA: Diagnosis not present

## 2023-07-27 DIAGNOSIS — R197 Diarrhea, unspecified: Secondary | ICD-10-CM | POA: Diagnosis not present

## 2023-07-27 DIAGNOSIS — D631 Anemia in chronic kidney disease: Secondary | ICD-10-CM | POA: Diagnosis not present

## 2023-07-27 DIAGNOSIS — R2990 Unspecified symptoms and signs involving the nervous system: Secondary | ICD-10-CM | POA: Diagnosis not present

## 2023-07-27 DIAGNOSIS — Z992 Dependence on renal dialysis: Secondary | ICD-10-CM | POA: Diagnosis not present

## 2023-07-27 DIAGNOSIS — N186 End stage renal disease: Secondary | ICD-10-CM | POA: Diagnosis not present

## 2023-07-27 DIAGNOSIS — L299 Pruritus, unspecified: Secondary | ICD-10-CM | POA: Diagnosis not present

## 2023-07-28 ENCOUNTER — Telehealth: Payer: Self-pay

## 2023-07-28 ENCOUNTER — Other Ambulatory Visit: Payer: Self-pay

## 2023-07-28 NOTE — Patient Outreach (Signed)
  Care Coordination   07/28/2023 Name: Kathy Frank MRN: [id number] DOB: [birth year]   Care Coordination Outreach Attempts:  An unsuccessful telephone outreach was attempted today to offer the patient information about available complex care management services.  Follow Up Plan:  Additional outreach attempts will be made to offer the patient complex care management information and services.   Encounter Outcome:  No Answer   Care Coordination Interventions:  No, not indicated     Lonzo Candy, BSN, [facility]  [facility], [facility] Health  Care Coordinator Phone: [phone]

## 2023-07-29 DIAGNOSIS — R519 Headache, unspecified: Secondary | ICD-10-CM | POA: Diagnosis not present

## 2023-07-29 DIAGNOSIS — N2581 Secondary hyperparathyroidism of renal origin: Secondary | ICD-10-CM | POA: Diagnosis not present

## 2023-07-29 DIAGNOSIS — R197 Diarrhea, unspecified: Secondary | ICD-10-CM | POA: Diagnosis not present

## 2023-07-29 DIAGNOSIS — L299 Pruritus, unspecified: Secondary | ICD-10-CM | POA: Diagnosis not present

## 2023-07-29 DIAGNOSIS — Z992 Dependence on renal dialysis: Secondary | ICD-10-CM | POA: Diagnosis not present

## 2023-07-29 DIAGNOSIS — D631 Anemia in chronic kidney disease: Secondary | ICD-10-CM | POA: Diagnosis not present

## 2023-07-29 DIAGNOSIS — E8779 Other fluid overload: Secondary | ICD-10-CM | POA: Diagnosis not present

## 2023-07-29 DIAGNOSIS — E1129 Type 2 diabetes mellitus with other diabetic kidney complication: Secondary | ICD-10-CM | POA: Diagnosis not present

## 2023-07-29 DIAGNOSIS — N186 End stage renal disease: Secondary | ICD-10-CM | POA: Diagnosis not present

## 2023-07-31 DIAGNOSIS — R519 Headache, unspecified: Secondary | ICD-10-CM | POA: Diagnosis not present

## 2023-07-31 DIAGNOSIS — D631 Anemia in chronic kidney disease: Secondary | ICD-10-CM | POA: Diagnosis not present

## 2023-07-31 DIAGNOSIS — Z992 Dependence on renal dialysis: Secondary | ICD-10-CM | POA: Diagnosis not present

## 2023-07-31 DIAGNOSIS — L299 Pruritus, unspecified: Secondary | ICD-10-CM | POA: Diagnosis not present

## 2023-07-31 DIAGNOSIS — R197 Diarrhea, unspecified: Secondary | ICD-10-CM | POA: Diagnosis not present

## 2023-07-31 DIAGNOSIS — E1129 Type 2 diabetes mellitus with other diabetic kidney complication: Secondary | ICD-10-CM | POA: Diagnosis not present

## 2023-07-31 DIAGNOSIS — N186 End stage renal disease: Secondary | ICD-10-CM | POA: Diagnosis not present

## 2023-07-31 DIAGNOSIS — E8779 Other fluid overload: Secondary | ICD-10-CM | POA: Diagnosis not present

## 2023-07-31 DIAGNOSIS — N2581 Secondary hyperparathyroidism of renal origin: Secondary | ICD-10-CM | POA: Diagnosis not present

## 2023-08-02 ENCOUNTER — Other Ambulatory Visit (HOSPITAL_COMMUNITY): Payer: Self-pay

## 2023-08-02 DIAGNOSIS — N186 End stage renal disease: Secondary | ICD-10-CM | POA: Diagnosis not present

## 2023-08-02 DIAGNOSIS — D631 Anemia in chronic kidney disease: Secondary | ICD-10-CM | POA: Diagnosis not present

## 2023-08-02 DIAGNOSIS — L299 Pruritus, unspecified: Secondary | ICD-10-CM | POA: Diagnosis not present

## 2023-08-02 DIAGNOSIS — Z992 Dependence on renal dialysis: Secondary | ICD-10-CM | POA: Diagnosis not present

## 2023-08-02 DIAGNOSIS — E8779 Other fluid overload: Secondary | ICD-10-CM | POA: Diagnosis not present

## 2023-08-02 DIAGNOSIS — R519 Headache, unspecified: Secondary | ICD-10-CM | POA: Diagnosis not present

## 2023-08-02 DIAGNOSIS — R197 Diarrhea, unspecified: Secondary | ICD-10-CM | POA: Diagnosis not present

## 2023-08-02 DIAGNOSIS — N2581 Secondary hyperparathyroidism of renal origin: Secondary | ICD-10-CM | POA: Diagnosis not present

## 2023-08-02 DIAGNOSIS — E1129 Type 2 diabetes mellitus with other diabetic kidney complication: Secondary | ICD-10-CM | POA: Diagnosis not present

## 2023-08-05 ENCOUNTER — Other Ambulatory Visit: Payer: Self-pay

## 2023-08-05 DIAGNOSIS — R519 Headache, unspecified: Secondary | ICD-10-CM | POA: Diagnosis not present

## 2023-08-05 DIAGNOSIS — L299 Pruritus, unspecified: Secondary | ICD-10-CM | POA: Diagnosis not present

## 2023-08-05 DIAGNOSIS — D631 Anemia in chronic kidney disease: Secondary | ICD-10-CM | POA: Diagnosis not present

## 2023-08-05 DIAGNOSIS — R197 Diarrhea, unspecified: Secondary | ICD-10-CM | POA: Diagnosis not present

## 2023-08-05 DIAGNOSIS — N2581 Secondary hyperparathyroidism of renal origin: Secondary | ICD-10-CM | POA: Diagnosis not present

## 2023-08-05 DIAGNOSIS — Z992 Dependence on renal dialysis: Secondary | ICD-10-CM | POA: Diagnosis not present

## 2023-08-05 DIAGNOSIS — E8779 Other fluid overload: Secondary | ICD-10-CM | POA: Diagnosis not present

## 2023-08-05 DIAGNOSIS — E1129 Type 2 diabetes mellitus with other diabetic kidney complication: Secondary | ICD-10-CM | POA: Diagnosis not present

## 2023-08-05 DIAGNOSIS — N186 End stage renal disease: Secondary | ICD-10-CM | POA: Diagnosis not present

## 2023-08-05 NOTE — Progress Notes (Signed)
Specialty Pharmacy Refill Coordination Note  Kathy Frank is a 47 y.o. female contacted today regarding refills of specialty medication(s) Bictegravir-Emtricitab-Tenofov [name]   Patient requested Delivery   Delivery date: 08/09/23   Verified address: [address] [place] [address]   Medication will be filled on 08/06/23.

## 2023-08-06 ENCOUNTER — Other Ambulatory Visit: Payer: Self-pay

## 2023-08-06 DIAGNOSIS — T879 Unspecified complications of amputation stump: Secondary | ICD-10-CM | POA: Diagnosis not present

## 2023-08-06 DIAGNOSIS — Z89612 Acquired absence of left leg above knee: Secondary | ICD-10-CM | POA: Diagnosis not present

## 2023-08-07 DIAGNOSIS — Z992 Dependence on renal dialysis: Secondary | ICD-10-CM | POA: Diagnosis not present

## 2023-08-07 DIAGNOSIS — N2581 Secondary hyperparathyroidism of renal origin: Secondary | ICD-10-CM | POA: Diagnosis not present

## 2023-08-07 DIAGNOSIS — R197 Diarrhea, unspecified: Secondary | ICD-10-CM | POA: Diagnosis not present

## 2023-08-07 DIAGNOSIS — N186 End stage renal disease: Secondary | ICD-10-CM | POA: Diagnosis not present

## 2023-08-07 DIAGNOSIS — E1129 Type 2 diabetes mellitus with other diabetic kidney complication: Secondary | ICD-10-CM | POA: Diagnosis not present

## 2023-08-07 DIAGNOSIS — R519 Headache, unspecified: Secondary | ICD-10-CM | POA: Diagnosis not present

## 2023-08-07 DIAGNOSIS — L299 Pruritus, unspecified: Secondary | ICD-10-CM | POA: Diagnosis not present

## 2023-08-07 DIAGNOSIS — D631 Anemia in chronic kidney disease: Secondary | ICD-10-CM | POA: Diagnosis not present

## 2023-08-07 DIAGNOSIS — E8779 Other fluid overload: Secondary | ICD-10-CM | POA: Diagnosis not present

## 2023-08-09 DIAGNOSIS — E1129 Type 2 diabetes mellitus with other diabetic kidney complication: Secondary | ICD-10-CM | POA: Diagnosis not present

## 2023-08-09 DIAGNOSIS — L299 Pruritus, unspecified: Secondary | ICD-10-CM | POA: Diagnosis not present

## 2023-08-09 DIAGNOSIS — R519 Headache, unspecified: Secondary | ICD-10-CM | POA: Diagnosis not present

## 2023-08-09 DIAGNOSIS — D631 Anemia in chronic kidney disease: Secondary | ICD-10-CM | POA: Diagnosis not present

## 2023-08-09 DIAGNOSIS — R197 Diarrhea, unspecified: Secondary | ICD-10-CM | POA: Diagnosis not present

## 2023-08-09 DIAGNOSIS — E8779 Other fluid overload: Secondary | ICD-10-CM | POA: Diagnosis not present

## 2023-08-09 DIAGNOSIS — N2581 Secondary hyperparathyroidism of renal origin: Secondary | ICD-10-CM | POA: Diagnosis not present

## 2023-08-09 DIAGNOSIS — Z992 Dependence on renal dialysis: Secondary | ICD-10-CM | POA: Diagnosis not present

## 2023-08-09 DIAGNOSIS — N186 End stage renal disease: Secondary | ICD-10-CM | POA: Diagnosis not present

## 2023-08-10 DIAGNOSIS — E1129 Type 2 diabetes mellitus with other diabetic kidney complication: Secondary | ICD-10-CM | POA: Diagnosis not present

## 2023-08-10 DIAGNOSIS — Z992 Dependence on renal dialysis: Secondary | ICD-10-CM | POA: Diagnosis not present

## 2023-08-10 DIAGNOSIS — N186 End stage renal disease: Secondary | ICD-10-CM | POA: Diagnosis not present

## 2023-08-12 DIAGNOSIS — N186 End stage renal disease: Secondary | ICD-10-CM | POA: Diagnosis not present

## 2023-08-12 DIAGNOSIS — E1110 Type 2 diabetes mellitus with ketoacidosis without coma: Secondary | ICD-10-CM | POA: Diagnosis not present

## 2023-08-12 DIAGNOSIS — I120 Hypertensive chronic kidney disease with stage 5 chronic kidney disease or end stage renal disease: Secondary | ICD-10-CM | POA: Diagnosis not present

## 2023-08-12 DIAGNOSIS — R197 Diarrhea, unspecified: Secondary | ICD-10-CM | POA: Diagnosis not present

## 2023-08-12 DIAGNOSIS — N2581 Secondary hyperparathyroidism of renal origin: Secondary | ICD-10-CM | POA: Diagnosis not present

## 2023-08-12 DIAGNOSIS — R519 Headache, unspecified: Secondary | ICD-10-CM | POA: Diagnosis not present

## 2023-08-12 DIAGNOSIS — Z992 Dependence on renal dialysis: Secondary | ICD-10-CM | POA: Diagnosis not present

## 2023-08-12 DIAGNOSIS — D631 Anemia in chronic kidney disease: Secondary | ICD-10-CM | POA: Diagnosis not present

## 2023-08-14 DIAGNOSIS — I120 Hypertensive chronic kidney disease with stage 5 chronic kidney disease or end stage renal disease: Secondary | ICD-10-CM | POA: Diagnosis not present

## 2023-08-14 DIAGNOSIS — D631 Anemia in chronic kidney disease: Secondary | ICD-10-CM | POA: Diagnosis not present

## 2023-08-14 DIAGNOSIS — N2581 Secondary hyperparathyroidism of renal origin: Secondary | ICD-10-CM | POA: Diagnosis not present

## 2023-08-14 DIAGNOSIS — N186 End stage renal disease: Secondary | ICD-10-CM | POA: Diagnosis not present

## 2023-08-14 DIAGNOSIS — E1110 Type 2 diabetes mellitus with ketoacidosis without coma: Secondary | ICD-10-CM | POA: Diagnosis not present

## 2023-08-14 DIAGNOSIS — Z992 Dependence on renal dialysis: Secondary | ICD-10-CM | POA: Diagnosis not present

## 2023-08-14 DIAGNOSIS — R197 Diarrhea, unspecified: Secondary | ICD-10-CM | POA: Diagnosis not present

## 2023-08-14 DIAGNOSIS — R519 Headache, unspecified: Secondary | ICD-10-CM | POA: Diagnosis not present

## 2023-08-16 ENCOUNTER — Ambulatory Visit: Payer: 59

## 2023-08-16 ENCOUNTER — Other Ambulatory Visit (HOSPITAL_COMMUNITY): Payer: Self-pay

## 2023-08-16 ENCOUNTER — Other Ambulatory Visit: Payer: Self-pay

## 2023-08-16 MED ORDER — UBRELVY 50 MG PO TABS: 50.0000 mg | ORAL_TABLET | ORAL | 3 refills | Status: DC | PRN

## 2023-08-17 DIAGNOSIS — N2581 Secondary hyperparathyroidism of renal origin: Secondary | ICD-10-CM | POA: Diagnosis not present

## 2023-08-17 DIAGNOSIS — E1110 Type 2 diabetes mellitus with ketoacidosis without coma: Secondary | ICD-10-CM | POA: Diagnosis not present

## 2023-08-17 DIAGNOSIS — D631 Anemia in chronic kidney disease: Secondary | ICD-10-CM | POA: Diagnosis not present

## 2023-08-17 DIAGNOSIS — Z992 Dependence on renal dialysis: Secondary | ICD-10-CM | POA: Diagnosis not present

## 2023-08-17 DIAGNOSIS — R519 Headache, unspecified: Secondary | ICD-10-CM | POA: Diagnosis not present

## 2023-08-17 DIAGNOSIS — I120 Hypertensive chronic kidney disease with stage 5 chronic kidney disease or end stage renal disease: Secondary | ICD-10-CM | POA: Diagnosis not present

## 2023-08-17 DIAGNOSIS — N186 End stage renal disease: Secondary | ICD-10-CM | POA: Diagnosis not present

## 2023-08-17 DIAGNOSIS — R197 Diarrhea, unspecified: Secondary | ICD-10-CM | POA: Diagnosis not present

## 2023-08-19 DIAGNOSIS — Z992 Dependence on renal dialysis: Secondary | ICD-10-CM | POA: Diagnosis not present

## 2023-08-19 DIAGNOSIS — N2581 Secondary hyperparathyroidism of renal origin: Secondary | ICD-10-CM | POA: Diagnosis not present

## 2023-08-19 DIAGNOSIS — I120 Hypertensive chronic kidney disease with stage 5 chronic kidney disease or end stage renal disease: Secondary | ICD-10-CM | POA: Diagnosis not present

## 2023-08-19 DIAGNOSIS — D631 Anemia in chronic kidney disease: Secondary | ICD-10-CM | POA: Diagnosis not present

## 2023-08-19 DIAGNOSIS — E1110 Type 2 diabetes mellitus with ketoacidosis without coma: Secondary | ICD-10-CM | POA: Diagnosis not present

## 2023-08-19 DIAGNOSIS — N186 End stage renal disease: Secondary | ICD-10-CM | POA: Diagnosis not present

## 2023-08-19 DIAGNOSIS — R197 Diarrhea, unspecified: Secondary | ICD-10-CM | POA: Diagnosis not present

## 2023-08-19 DIAGNOSIS — R519 Headache, unspecified: Secondary | ICD-10-CM | POA: Diagnosis not present

## 2023-08-24 DIAGNOSIS — R519 Headache, unspecified: Secondary | ICD-10-CM | POA: Diagnosis not present

## 2023-08-24 DIAGNOSIS — E1110 Type 2 diabetes mellitus with ketoacidosis without coma: Secondary | ICD-10-CM | POA: Diagnosis not present

## 2023-08-24 DIAGNOSIS — D631 Anemia in chronic kidney disease: Secondary | ICD-10-CM | POA: Diagnosis not present

## 2023-08-24 DIAGNOSIS — Z992 Dependence on renal dialysis: Secondary | ICD-10-CM | POA: Diagnosis not present

## 2023-08-24 DIAGNOSIS — N186 End stage renal disease: Secondary | ICD-10-CM | POA: Diagnosis not present

## 2023-08-24 DIAGNOSIS — N2581 Secondary hyperparathyroidism of renal origin: Secondary | ICD-10-CM | POA: Diagnosis not present

## 2023-08-24 DIAGNOSIS — I120 Hypertensive chronic kidney disease with stage 5 chronic kidney disease or end stage renal disease: Secondary | ICD-10-CM | POA: Diagnosis not present

## 2023-08-24 DIAGNOSIS — R197 Diarrhea, unspecified: Secondary | ICD-10-CM | POA: Diagnosis not present

## 2023-08-25 ENCOUNTER — Other Ambulatory Visit: Payer: Self-pay

## 2023-08-26 DIAGNOSIS — R197 Diarrhea, unspecified: Secondary | ICD-10-CM | POA: Diagnosis not present

## 2023-08-26 DIAGNOSIS — E1110 Type 2 diabetes mellitus with ketoacidosis without coma: Secondary | ICD-10-CM | POA: Diagnosis not present

## 2023-08-26 DIAGNOSIS — I120 Hypertensive chronic kidney disease with stage 5 chronic kidney disease or end stage renal disease: Secondary | ICD-10-CM | POA: Diagnosis not present

## 2023-08-26 DIAGNOSIS — N186 End stage renal disease: Secondary | ICD-10-CM | POA: Diagnosis not present

## 2023-08-26 DIAGNOSIS — R519 Headache, unspecified: Secondary | ICD-10-CM | POA: Diagnosis not present

## 2023-08-26 DIAGNOSIS — D631 Anemia in chronic kidney disease: Secondary | ICD-10-CM | POA: Diagnosis not present

## 2023-08-26 DIAGNOSIS — Z992 Dependence on renal dialysis: Secondary | ICD-10-CM | POA: Diagnosis not present

## 2023-08-26 DIAGNOSIS — N2581 Secondary hyperparathyroidism of renal origin: Secondary | ICD-10-CM | POA: Diagnosis not present

## 2023-08-27 DIAGNOSIS — T879 Unspecified complications of amputation stump: Secondary | ICD-10-CM | POA: Diagnosis not present

## 2023-08-27 DIAGNOSIS — R2990 Unspecified symptoms and signs involving the nervous system: Secondary | ICD-10-CM | POA: Diagnosis not present

## 2023-08-28 DIAGNOSIS — E1110 Type 2 diabetes mellitus with ketoacidosis without coma: Secondary | ICD-10-CM | POA: Diagnosis not present

## 2023-08-28 DIAGNOSIS — D631 Anemia in chronic kidney disease: Secondary | ICD-10-CM | POA: Diagnosis not present

## 2023-08-28 DIAGNOSIS — I120 Hypertensive chronic kidney disease with stage 5 chronic kidney disease or end stage renal disease: Secondary | ICD-10-CM | POA: Diagnosis not present

## 2023-08-28 DIAGNOSIS — Z992 Dependence on renal dialysis: Secondary | ICD-10-CM | POA: Diagnosis not present

## 2023-08-28 DIAGNOSIS — N186 End stage renal disease: Secondary | ICD-10-CM | POA: Diagnosis not present

## 2023-08-28 DIAGNOSIS — N2581 Secondary hyperparathyroidism of renal origin: Secondary | ICD-10-CM | POA: Diagnosis not present

## 2023-08-28 DIAGNOSIS — R519 Headache, unspecified: Secondary | ICD-10-CM | POA: Diagnosis not present

## 2023-08-28 DIAGNOSIS — R197 Diarrhea, unspecified: Secondary | ICD-10-CM | POA: Diagnosis not present

## 2023-08-30 ENCOUNTER — Other Ambulatory Visit: Payer: Self-pay

## 2023-08-30 ENCOUNTER — Encounter (HOSPITAL_COMMUNITY): Payer: Self-pay

## 2023-08-30 NOTE — Progress Notes (Signed)
Specialty Pharmacy Refill Coordination Note  Kathy Frank is a 48 y.o. female contacted today regarding refills of specialty medication(s) Bictegravir-Emtricitab-Tenofov [name]   Patient requested Delivery   Delivery date: 09/02/23   Verified address: [address] [place] [address]   Medication will be filled on 01.22.25.

## 2023-08-31 ENCOUNTER — Telehealth: Payer: Self-pay

## 2023-08-31 ENCOUNTER — Other Ambulatory Visit: Payer: Self-pay | Admitting: Student

## 2023-08-31 DIAGNOSIS — N186 End stage renal disease: Secondary | ICD-10-CM | POA: Diagnosis not present

## 2023-08-31 DIAGNOSIS — D631 Anemia in chronic kidney disease: Secondary | ICD-10-CM | POA: Diagnosis not present

## 2023-08-31 DIAGNOSIS — I120 Hypertensive chronic kidney disease with stage 5 chronic kidney disease or end stage renal disease: Secondary | ICD-10-CM | POA: Diagnosis not present

## 2023-08-31 DIAGNOSIS — R519 Headache, unspecified: Secondary | ICD-10-CM | POA: Diagnosis not present

## 2023-08-31 DIAGNOSIS — Z992 Dependence on renal dialysis: Secondary | ICD-10-CM | POA: Diagnosis not present

## 2023-08-31 DIAGNOSIS — E1110 Type 2 diabetes mellitus with ketoacidosis without coma: Secondary | ICD-10-CM | POA: Diagnosis not present

## 2023-08-31 DIAGNOSIS — N2581 Secondary hyperparathyroidism of renal origin: Secondary | ICD-10-CM | POA: Diagnosis not present

## 2023-08-31 DIAGNOSIS — R197 Diarrhea, unspecified: Secondary | ICD-10-CM | POA: Diagnosis not present

## 2023-08-31 MED ORDER — UBRELVY 50 MG PO TABS: 50.0000 mg | ORAL_TABLET | ORAL | 3 refills | Status: DC | PRN

## 2023-08-31 NOTE — Telephone Encounter (Signed)
Patient called regarding her rx for Bernita Raisin, per patient the pharmacy is requesting 10 tablets instead if 8 tablets due to having to split the pill package leaving 2 tablets in the pack that they don't use. Please return patients call.

## 2023-09-02 DIAGNOSIS — R519 Headache, unspecified: Secondary | ICD-10-CM | POA: Diagnosis not present

## 2023-09-02 DIAGNOSIS — E1110 Type 2 diabetes mellitus with ketoacidosis without coma: Secondary | ICD-10-CM | POA: Diagnosis not present

## 2023-09-02 DIAGNOSIS — N186 End stage renal disease: Secondary | ICD-10-CM | POA: Diagnosis not present

## 2023-09-02 DIAGNOSIS — R197 Diarrhea, unspecified: Secondary | ICD-10-CM | POA: Diagnosis not present

## 2023-09-02 DIAGNOSIS — D631 Anemia in chronic kidney disease: Secondary | ICD-10-CM | POA: Diagnosis not present

## 2023-09-02 DIAGNOSIS — N2581 Secondary hyperparathyroidism of renal origin: Secondary | ICD-10-CM | POA: Diagnosis not present

## 2023-09-02 DIAGNOSIS — I120 Hypertensive chronic kidney disease with stage 5 chronic kidney disease or end stage renal disease: Secondary | ICD-10-CM | POA: Diagnosis not present

## 2023-09-02 DIAGNOSIS — Z992 Dependence on renal dialysis: Secondary | ICD-10-CM | POA: Diagnosis not present

## 2023-09-04 DIAGNOSIS — N186 End stage renal disease: Secondary | ICD-10-CM | POA: Diagnosis not present

## 2023-09-04 DIAGNOSIS — N2581 Secondary hyperparathyroidism of renal origin: Secondary | ICD-10-CM | POA: Diagnosis not present

## 2023-09-04 DIAGNOSIS — D631 Anemia in chronic kidney disease: Secondary | ICD-10-CM | POA: Diagnosis not present

## 2023-09-04 DIAGNOSIS — Z992 Dependence on renal dialysis: Secondary | ICD-10-CM | POA: Diagnosis not present

## 2023-09-04 DIAGNOSIS — R519 Headache, unspecified: Secondary | ICD-10-CM | POA: Diagnosis not present

## 2023-09-04 DIAGNOSIS — I120 Hypertensive chronic kidney disease with stage 5 chronic kidney disease or end stage renal disease: Secondary | ICD-10-CM | POA: Diagnosis not present

## 2023-09-04 DIAGNOSIS — R197 Diarrhea, unspecified: Secondary | ICD-10-CM | POA: Diagnosis not present

## 2023-09-04 DIAGNOSIS — E1110 Type 2 diabetes mellitus with ketoacidosis without coma: Secondary | ICD-10-CM | POA: Diagnosis not present

## 2023-09-06 ENCOUNTER — Ambulatory Visit: Payer: 59

## 2023-09-06 DIAGNOSIS — Z89612 Acquired absence of left leg above knee: Secondary | ICD-10-CM | POA: Diagnosis not present

## 2023-09-06 DIAGNOSIS — T879 Unspecified complications of amputation stump: Secondary | ICD-10-CM | POA: Diagnosis not present

## 2023-09-07 DIAGNOSIS — N186 End stage renal disease: Secondary | ICD-10-CM | POA: Diagnosis not present

## 2023-09-07 DIAGNOSIS — R197 Diarrhea, unspecified: Secondary | ICD-10-CM | POA: Diagnosis not present

## 2023-09-07 DIAGNOSIS — E1110 Type 2 diabetes mellitus with ketoacidosis without coma: Secondary | ICD-10-CM | POA: Diagnosis not present

## 2023-09-07 DIAGNOSIS — N2581 Secondary hyperparathyroidism of renal origin: Secondary | ICD-10-CM | POA: Diagnosis not present

## 2023-09-07 DIAGNOSIS — R519 Headache, unspecified: Secondary | ICD-10-CM | POA: Diagnosis not present

## 2023-09-07 DIAGNOSIS — Z992 Dependence on renal dialysis: Secondary | ICD-10-CM | POA: Diagnosis not present

## 2023-09-07 DIAGNOSIS — I120 Hypertensive chronic kidney disease with stage 5 chronic kidney disease or end stage renal disease: Secondary | ICD-10-CM | POA: Diagnosis not present

## 2023-09-07 DIAGNOSIS — D631 Anemia in chronic kidney disease: Secondary | ICD-10-CM | POA: Diagnosis not present

## 2023-09-10 DIAGNOSIS — Z992 Dependence on renal dialysis: Secondary | ICD-10-CM | POA: Diagnosis not present

## 2023-09-10 DIAGNOSIS — N186 End stage renal disease: Secondary | ICD-10-CM | POA: Diagnosis not present

## 2023-09-10 DIAGNOSIS — E1129 Type 2 diabetes mellitus with other diabetic kidney complication: Secondary | ICD-10-CM | POA: Diagnosis not present

## 2023-09-11 DIAGNOSIS — Z992 Dependence on renal dialysis: Secondary | ICD-10-CM | POA: Diagnosis not present

## 2023-09-11 DIAGNOSIS — L299 Pruritus, unspecified: Secondary | ICD-10-CM | POA: Diagnosis not present

## 2023-09-11 DIAGNOSIS — R197 Diarrhea, unspecified: Secondary | ICD-10-CM | POA: Diagnosis not present

## 2023-09-11 DIAGNOSIS — N186 End stage renal disease: Secondary | ICD-10-CM | POA: Diagnosis not present

## 2023-09-11 DIAGNOSIS — D631 Anemia in chronic kidney disease: Secondary | ICD-10-CM | POA: Diagnosis not present

## 2023-09-11 DIAGNOSIS — R519 Headache, unspecified: Secondary | ICD-10-CM | POA: Diagnosis not present

## 2023-09-11 DIAGNOSIS — E875 Hyperkalemia: Secondary | ICD-10-CM | POA: Diagnosis not present

## 2023-09-11 DIAGNOSIS — N2581 Secondary hyperparathyroidism of renal origin: Secondary | ICD-10-CM | POA: Diagnosis not present

## 2023-09-14 DIAGNOSIS — N2581 Secondary hyperparathyroidism of renal origin: Secondary | ICD-10-CM | POA: Diagnosis not present

## 2023-09-14 DIAGNOSIS — N186 End stage renal disease: Secondary | ICD-10-CM | POA: Diagnosis not present

## 2023-09-14 DIAGNOSIS — E875 Hyperkalemia: Secondary | ICD-10-CM | POA: Diagnosis not present

## 2023-09-14 DIAGNOSIS — R197 Diarrhea, unspecified: Secondary | ICD-10-CM | POA: Diagnosis not present

## 2023-09-14 DIAGNOSIS — L299 Pruritus, unspecified: Secondary | ICD-10-CM | POA: Diagnosis not present

## 2023-09-14 DIAGNOSIS — R519 Headache, unspecified: Secondary | ICD-10-CM | POA: Diagnosis not present

## 2023-09-14 DIAGNOSIS — D631 Anemia in chronic kidney disease: Secondary | ICD-10-CM | POA: Diagnosis not present

## 2023-09-14 DIAGNOSIS — Z992 Dependence on renal dialysis: Secondary | ICD-10-CM | POA: Diagnosis not present

## 2023-09-16 DIAGNOSIS — Z992 Dependence on renal dialysis: Secondary | ICD-10-CM | POA: Diagnosis not present

## 2023-09-16 DIAGNOSIS — N186 End stage renal disease: Secondary | ICD-10-CM | POA: Diagnosis not present

## 2023-09-16 DIAGNOSIS — R519 Headache, unspecified: Secondary | ICD-10-CM | POA: Diagnosis not present

## 2023-09-16 DIAGNOSIS — R197 Diarrhea, unspecified: Secondary | ICD-10-CM | POA: Diagnosis not present

## 2023-09-16 DIAGNOSIS — D631 Anemia in chronic kidney disease: Secondary | ICD-10-CM | POA: Diagnosis not present

## 2023-09-16 DIAGNOSIS — N2581 Secondary hyperparathyroidism of renal origin: Secondary | ICD-10-CM | POA: Diagnosis not present

## 2023-09-16 DIAGNOSIS — L299 Pruritus, unspecified: Secondary | ICD-10-CM | POA: Diagnosis not present

## 2023-09-16 DIAGNOSIS — E875 Hyperkalemia: Secondary | ICD-10-CM | POA: Diagnosis not present

## 2023-09-18 DIAGNOSIS — R519 Headache, unspecified: Secondary | ICD-10-CM | POA: Diagnosis not present

## 2023-09-18 DIAGNOSIS — R197 Diarrhea, unspecified: Secondary | ICD-10-CM | POA: Diagnosis not present

## 2023-09-18 DIAGNOSIS — Z992 Dependence on renal dialysis: Secondary | ICD-10-CM | POA: Diagnosis not present

## 2023-09-18 DIAGNOSIS — E875 Hyperkalemia: Secondary | ICD-10-CM | POA: Diagnosis not present

## 2023-09-18 DIAGNOSIS — D631 Anemia in chronic kidney disease: Secondary | ICD-10-CM | POA: Diagnosis not present

## 2023-09-18 DIAGNOSIS — N186 End stage renal disease: Secondary | ICD-10-CM | POA: Diagnosis not present

## 2023-09-18 DIAGNOSIS — N2581 Secondary hyperparathyroidism of renal origin: Secondary | ICD-10-CM | POA: Diagnosis not present

## 2023-09-18 DIAGNOSIS — L299 Pruritus, unspecified: Secondary | ICD-10-CM | POA: Diagnosis not present

## 2023-09-20 ENCOUNTER — Encounter (HOSPITAL_COMMUNITY): Payer: Self-pay

## 2023-09-20 ENCOUNTER — Telehealth: Payer: Self-pay | Admitting: *Deleted

## 2023-09-20 NOTE — Progress Notes (Signed)
Complex Care Management Care Guide Note  09/20/2023 Name: Kathy Frank MRN: [id number] DOB: Jan 31, 1976  Kathy Frank is a 48 y.o. year old female who is a primary care patient of Tawkaliyar, Roya, DO and is actively engaged with the care management team. I reached out to [name] by phone today to assist with re-scheduling  with the RN Case Manager.  Follow up plan: Unsuccessful telephone outreach attempt made. A HIPAA compliant phone message was left for the patient providing contact information and requesting a return call.  [name]  [facility], [facility]  [facility], [facility] Guide  Direct Dial: [phone]  Fax [phone]

## 2023-09-21 ENCOUNTER — Ambulatory Visit: Payer: 59 | Admitting: Infectious Diseases

## 2023-09-21 DIAGNOSIS — Z992 Dependence on renal dialysis: Secondary | ICD-10-CM | POA: Diagnosis not present

## 2023-09-21 DIAGNOSIS — L299 Pruritus, unspecified: Secondary | ICD-10-CM | POA: Diagnosis not present

## 2023-09-21 DIAGNOSIS — R197 Diarrhea, unspecified: Secondary | ICD-10-CM | POA: Diagnosis not present

## 2023-09-21 DIAGNOSIS — D631 Anemia in chronic kidney disease: Secondary | ICD-10-CM | POA: Diagnosis not present

## 2023-09-21 DIAGNOSIS — N2581 Secondary hyperparathyroidism of renal origin: Secondary | ICD-10-CM | POA: Diagnosis not present

## 2023-09-21 DIAGNOSIS — N186 End stage renal disease: Secondary | ICD-10-CM | POA: Diagnosis not present

## 2023-09-21 DIAGNOSIS — R519 Headache, unspecified: Secondary | ICD-10-CM | POA: Diagnosis not present

## 2023-09-21 DIAGNOSIS — E875 Hyperkalemia: Secondary | ICD-10-CM | POA: Diagnosis not present

## 2023-09-22 ENCOUNTER — Other Ambulatory Visit: Payer: Self-pay

## 2023-09-22 MED ORDER — LANTUS SOLOSTAR 100 UNIT/ML ~~LOC~~ SOPN: 15.0000 [IU] | PEN_INJECTOR | Freq: Every day | SUBCUTANEOUS | 4 refills | Status: AC

## 2023-09-23 ENCOUNTER — Other Ambulatory Visit (HOSPITAL_COMMUNITY): Payer: Self-pay

## 2023-09-23 DIAGNOSIS — L299 Pruritus, unspecified: Secondary | ICD-10-CM | POA: Diagnosis not present

## 2023-09-23 DIAGNOSIS — R519 Headache, unspecified: Secondary | ICD-10-CM | POA: Diagnosis not present

## 2023-09-23 DIAGNOSIS — D631 Anemia in chronic kidney disease: Secondary | ICD-10-CM | POA: Diagnosis not present

## 2023-09-23 DIAGNOSIS — N186 End stage renal disease: Secondary | ICD-10-CM | POA: Diagnosis not present

## 2023-09-23 DIAGNOSIS — R197 Diarrhea, unspecified: Secondary | ICD-10-CM | POA: Diagnosis not present

## 2023-09-23 DIAGNOSIS — E875 Hyperkalemia: Secondary | ICD-10-CM | POA: Diagnosis not present

## 2023-09-23 DIAGNOSIS — Z992 Dependence on renal dialysis: Secondary | ICD-10-CM | POA: Diagnosis not present

## 2023-09-23 DIAGNOSIS — N2581 Secondary hyperparathyroidism of renal origin: Secondary | ICD-10-CM | POA: Diagnosis not present

## 2023-09-25 DIAGNOSIS — N186 End stage renal disease: Secondary | ICD-10-CM | POA: Diagnosis not present

## 2023-09-25 DIAGNOSIS — D631 Anemia in chronic kidney disease: Secondary | ICD-10-CM | POA: Diagnosis not present

## 2023-09-25 DIAGNOSIS — L299 Pruritus, unspecified: Secondary | ICD-10-CM | POA: Diagnosis not present

## 2023-09-25 DIAGNOSIS — R519 Headache, unspecified: Secondary | ICD-10-CM | POA: Diagnosis not present

## 2023-09-25 DIAGNOSIS — Z992 Dependence on renal dialysis: Secondary | ICD-10-CM | POA: Diagnosis not present

## 2023-09-25 DIAGNOSIS — N2581 Secondary hyperparathyroidism of renal origin: Secondary | ICD-10-CM | POA: Diagnosis not present

## 2023-09-25 DIAGNOSIS — E875 Hyperkalemia: Secondary | ICD-10-CM | POA: Diagnosis not present

## 2023-09-25 DIAGNOSIS — R197 Diarrhea, unspecified: Secondary | ICD-10-CM | POA: Diagnosis not present

## 2023-09-27 ENCOUNTER — Other Ambulatory Visit: Payer: Self-pay | Admitting: Student

## 2023-09-27 ENCOUNTER — Ambulatory Visit
Admission: RE | Admit: 2023-09-27 | Discharge: 2023-09-27 | Disposition: A | Discharge status: Home / Self Care | Payer: 59 | Source: Ambulatory Visit | Attending: Student in an Organized Health Care Education/Training Program | Admitting: Student in an Organized Health Care Education/Training Program

## 2023-09-27 DIAGNOSIS — T879 Unspecified complications of amputation stump: Secondary | ICD-10-CM | POA: Diagnosis not present

## 2023-09-27 DIAGNOSIS — R2990 Unspecified symptoms and signs involving the nervous system: Secondary | ICD-10-CM | POA: Diagnosis not present

## 2023-09-27 DIAGNOSIS — Z1231 Encounter for screening mammogram for malignant neoplasm of breast: Secondary | ICD-10-CM | POA: Diagnosis not present

## 2023-09-27 NOTE — Telephone Encounter (Signed)
LOV was 05/10/23.  Telehealth appt 06/11/23. Pt was called to schedule appt - no answer; left message on vm.

## 2023-09-28 DIAGNOSIS — R519 Headache, unspecified: Secondary | ICD-10-CM | POA: Diagnosis not present

## 2023-09-28 DIAGNOSIS — D631 Anemia in chronic kidney disease: Secondary | ICD-10-CM | POA: Diagnosis not present

## 2023-09-28 DIAGNOSIS — E875 Hyperkalemia: Secondary | ICD-10-CM | POA: Diagnosis not present

## 2023-09-28 DIAGNOSIS — N2581 Secondary hyperparathyroidism of renal origin: Secondary | ICD-10-CM | POA: Diagnosis not present

## 2023-09-28 DIAGNOSIS — L299 Pruritus, unspecified: Secondary | ICD-10-CM | POA: Diagnosis not present

## 2023-09-28 DIAGNOSIS — Z992 Dependence on renal dialysis: Secondary | ICD-10-CM | POA: Diagnosis not present

## 2023-09-28 DIAGNOSIS — R197 Diarrhea, unspecified: Secondary | ICD-10-CM | POA: Diagnosis not present

## 2023-09-28 DIAGNOSIS — N186 End stage renal disease: Secondary | ICD-10-CM | POA: Diagnosis not present

## 2023-09-29 ENCOUNTER — Other Ambulatory Visit: Payer: Self-pay

## 2023-09-29 ENCOUNTER — Other Ambulatory Visit: Payer: Self-pay | Admitting: Pharmacy Technician

## 2023-09-29 NOTE — Progress Notes (Signed)
Specialty Pharmacy Refill Coordination Note  Kathy Frank is a 48 y.o. female contacted today regarding refills of specialty medication(s) Bictegravir-Emtricitab-Tenofov [name]   Patient requested Delivery   Delivery date: 10/05/23   Verified address: [address] [place]   Medication will be filled on 10/04/23.

## 2023-09-30 ENCOUNTER — Other Ambulatory Visit: Payer: Self-pay

## 2023-09-30 DIAGNOSIS — Z992 Dependence on renal dialysis: Secondary | ICD-10-CM | POA: Diagnosis not present

## 2023-09-30 DIAGNOSIS — N186 End stage renal disease: Secondary | ICD-10-CM | POA: Diagnosis not present

## 2023-09-30 DIAGNOSIS — N2581 Secondary hyperparathyroidism of renal origin: Secondary | ICD-10-CM | POA: Diagnosis not present

## 2023-09-30 DIAGNOSIS — D631 Anemia in chronic kidney disease: Secondary | ICD-10-CM | POA: Diagnosis not present

## 2023-09-30 DIAGNOSIS — E875 Hyperkalemia: Secondary | ICD-10-CM | POA: Diagnosis not present

## 2023-09-30 DIAGNOSIS — R197 Diarrhea, unspecified: Secondary | ICD-10-CM | POA: Diagnosis not present

## 2023-09-30 DIAGNOSIS — L299 Pruritus, unspecified: Secondary | ICD-10-CM | POA: Diagnosis not present

## 2023-09-30 DIAGNOSIS — R519 Headache, unspecified: Secondary | ICD-10-CM | POA: Diagnosis not present

## 2023-09-30 MED ORDER — PENTIPS 32G X 4 MM MISC: 3 refills | Status: DC

## 2023-09-30 NOTE — Progress Notes (Signed)
Complex Care Management Care Guide Note  09/30/2023 Name: Kathy Frank MRN: [id number] DOB: [birth year]  [name] is a 48 y.o. year old female who is a primary care patient of Tawkaliyar, Roya, DO and is actively engaged with the care management team. I reached out to [name] by phone today to assist with scheduling  with the RN Case Manager.  Follow up plan: Telephone appointment with complex care management team member scheduled for:  3/5  [name]  [facility]  [facility], [facility] Guide  Direct Dial: [phone]  Fax [phone]

## 2023-09-30 NOTE — Telephone Encounter (Signed)
Per pharmacy" we are needing clarification regarding the patients pen needles. The transfer we received from [facility] has directions use as directed. We need to verify how many pen needles this patient is to use per day in order to determine a day supply for the insurance. Please send a new rx with updated directions"

## 2023-10-01 ENCOUNTER — Other Ambulatory Visit: Payer: Self-pay | Admitting: Student in an Organized Health Care Education/Training Program

## 2023-10-01 DIAGNOSIS — R928 Other abnormal and inconclusive findings on diagnostic imaging of breast: Secondary | ICD-10-CM

## 2023-10-02 DIAGNOSIS — R519 Headache, unspecified: Secondary | ICD-10-CM | POA: Diagnosis not present

## 2023-10-02 DIAGNOSIS — Z992 Dependence on renal dialysis: Secondary | ICD-10-CM | POA: Diagnosis not present

## 2023-10-02 DIAGNOSIS — D631 Anemia in chronic kidney disease: Secondary | ICD-10-CM | POA: Diagnosis not present

## 2023-10-02 DIAGNOSIS — N2581 Secondary hyperparathyroidism of renal origin: Secondary | ICD-10-CM | POA: Diagnosis not present

## 2023-10-02 DIAGNOSIS — E875 Hyperkalemia: Secondary | ICD-10-CM | POA: Diagnosis not present

## 2023-10-02 DIAGNOSIS — L299 Pruritus, unspecified: Secondary | ICD-10-CM | POA: Diagnosis not present

## 2023-10-02 DIAGNOSIS — N186 End stage renal disease: Secondary | ICD-10-CM | POA: Diagnosis not present

## 2023-10-02 DIAGNOSIS — R197 Diarrhea, unspecified: Secondary | ICD-10-CM | POA: Diagnosis not present

## 2023-10-04 ENCOUNTER — Ambulatory Visit: Payer: 59 | Admitting: Infectious Diseases

## 2023-10-05 DIAGNOSIS — R197 Diarrhea, unspecified: Secondary | ICD-10-CM | POA: Diagnosis not present

## 2023-10-05 DIAGNOSIS — L299 Pruritus, unspecified: Secondary | ICD-10-CM | POA: Diagnosis not present

## 2023-10-05 DIAGNOSIS — R519 Headache, unspecified: Secondary | ICD-10-CM | POA: Diagnosis not present

## 2023-10-05 DIAGNOSIS — Z992 Dependence on renal dialysis: Secondary | ICD-10-CM | POA: Diagnosis not present

## 2023-10-05 DIAGNOSIS — N186 End stage renal disease: Secondary | ICD-10-CM | POA: Diagnosis not present

## 2023-10-05 DIAGNOSIS — N2581 Secondary hyperparathyroidism of renal origin: Secondary | ICD-10-CM | POA: Diagnosis not present

## 2023-10-05 DIAGNOSIS — D631 Anemia in chronic kidney disease: Secondary | ICD-10-CM | POA: Diagnosis not present

## 2023-10-05 DIAGNOSIS — E875 Hyperkalemia: Secondary | ICD-10-CM | POA: Diagnosis not present

## 2023-10-07 DIAGNOSIS — E875 Hyperkalemia: Secondary | ICD-10-CM | POA: Diagnosis not present

## 2023-10-07 DIAGNOSIS — L299 Pruritus, unspecified: Secondary | ICD-10-CM | POA: Diagnosis not present

## 2023-10-07 DIAGNOSIS — N186 End stage renal disease: Secondary | ICD-10-CM | POA: Diagnosis not present

## 2023-10-07 DIAGNOSIS — T879 Unspecified complications of amputation stump: Secondary | ICD-10-CM | POA: Diagnosis not present

## 2023-10-07 DIAGNOSIS — D631 Anemia in chronic kidney disease: Secondary | ICD-10-CM | POA: Diagnosis not present

## 2023-10-07 DIAGNOSIS — N2581 Secondary hyperparathyroidism of renal origin: Secondary | ICD-10-CM | POA: Diagnosis not present

## 2023-10-07 DIAGNOSIS — Z89612 Acquired absence of left leg above knee: Secondary | ICD-10-CM | POA: Diagnosis not present

## 2023-10-07 DIAGNOSIS — R197 Diarrhea, unspecified: Secondary | ICD-10-CM | POA: Diagnosis not present

## 2023-10-07 DIAGNOSIS — Z992 Dependence on renal dialysis: Secondary | ICD-10-CM | POA: Diagnosis not present

## 2023-10-07 DIAGNOSIS — R519 Headache, unspecified: Secondary | ICD-10-CM | POA: Diagnosis not present

## 2023-10-08 DIAGNOSIS — N186 End stage renal disease: Secondary | ICD-10-CM | POA: Diagnosis not present

## 2023-10-08 DIAGNOSIS — E1129 Type 2 diabetes mellitus with other diabetic kidney complication: Secondary | ICD-10-CM | POA: Diagnosis not present

## 2023-10-08 DIAGNOSIS — Z992 Dependence on renal dialysis: Secondary | ICD-10-CM | POA: Diagnosis not present

## 2023-10-09 DIAGNOSIS — E1129 Type 2 diabetes mellitus with other diabetic kidney complication: Secondary | ICD-10-CM | POA: Diagnosis not present

## 2023-10-09 DIAGNOSIS — D631 Anemia in chronic kidney disease: Secondary | ICD-10-CM | POA: Diagnosis not present

## 2023-10-09 DIAGNOSIS — N2581 Secondary hyperparathyroidism of renal origin: Secondary | ICD-10-CM | POA: Diagnosis not present

## 2023-10-09 DIAGNOSIS — N186 End stage renal disease: Secondary | ICD-10-CM | POA: Diagnosis not present

## 2023-10-09 DIAGNOSIS — Z992 Dependence on renal dialysis: Secondary | ICD-10-CM | POA: Diagnosis not present

## 2023-10-09 DIAGNOSIS — E875 Hyperkalemia: Secondary | ICD-10-CM | POA: Diagnosis not present

## 2023-10-11 ENCOUNTER — Other Ambulatory Visit: Payer: 59

## 2023-10-13 ENCOUNTER — Ambulatory Visit: Payer: Self-pay

## 2023-10-13 NOTE — Patient Outreach (Unsigned)
 Care Coordination   Follow Up Visit Note   10/13/2023 Name: Kathy Frank MRN: [id number] DOB: January 21, 1976  Kathy Frank is a 48 y.o. year old female who sees Tawkaliyar, Roya, DO for primary care. I spoke with  [name] by phone today.  What matters to the patients health and wellness today?  ***    Goals Addressed   None     SDOH assessments and interventions completed:  {yes/no:20286}{THN Tip this will not be part of the note when signed-REQUIRED REPORT FIELD DO NOT DELETE (Optional):27901}     Care Coordination Interventions:  {INTERVENTIONS:27767} {THN Tip this will not be part of the note when signed-REQUIRED REPORT FIELD DO NOT DELETE (Optional):27901}  Follow up plan: {CCFOLLOWUP:27768}   Encounter Outcome:  {ENCOUTCOME:27770} {THN Tip this will not be part of the note when signed-REQUIRED REPORT FIELD DO NOT DELETE (Optional):27901}

## 2023-10-14 DIAGNOSIS — Z992 Dependence on renal dialysis: Secondary | ICD-10-CM | POA: Diagnosis not present

## 2023-10-14 DIAGNOSIS — E875 Hyperkalemia: Secondary | ICD-10-CM | POA: Diagnosis not present

## 2023-10-14 DIAGNOSIS — E1129 Type 2 diabetes mellitus with other diabetic kidney complication: Secondary | ICD-10-CM | POA: Diagnosis not present

## 2023-10-14 DIAGNOSIS — N186 End stage renal disease: Secondary | ICD-10-CM | POA: Diagnosis not present

## 2023-10-14 DIAGNOSIS — D631 Anemia in chronic kidney disease: Secondary | ICD-10-CM | POA: Diagnosis not present

## 2023-10-14 DIAGNOSIS — N2581 Secondary hyperparathyroidism of renal origin: Secondary | ICD-10-CM | POA: Diagnosis not present

## 2023-10-14 NOTE — Patient Instructions (Signed)
 Visit Information  Thank you for taking time to visit with me today. Please don't hesitate to contact me if I can be of assistance to you.   Following are the goals we discussed today:   Goals Addressed             This Visit's Progress    CCM Expected Outcome:  Monitor, Self-Manage, and Reduce Symptoms of Hypertension       Patient Goals/Self Care Activities: -Patient/Caregiver will take medications as prescribed   -Patient/Caregiver will attend all scheduled provider appointments -Patient/Caregiver will call pharmacy for medication refills 3-7 days in advance of running out of medications -Patient/Caregiver will call provider office for new concerns or questions  -Patient/Caregiver will focus on medication adherence by taking medications as prescribed   Last practice recorded BP readings:  BP Readings from Last 3 Encounters:  05/21/23 (!) 151/77  05/18/23 (!) 207/95  05/10/23 (!) 179/57   Provided education to patient re: stroke prevention, s/s of heart attack and stroke Reviewed medications with patient and discussed importance of compliance Discussed the purchase of blood pressure monitor         Our next appointment is by telephone on 11/17/23 at 1130 am  Please call the care guide team at [phone] if you need to cancel or reschedule your appointment.   If you are experiencing a Mental Health or Behavioral Health Crisis or need someone to talk to, please call 1-800-273-TALK (toll free, 24 hour hotline)  Patient verbalizes understanding of instructions and care plan provided today.   Juanell Fairly RN, BSN, [facility]  [facility], [facility]  Care Coordinator Phone: [phone]

## 2023-10-14 NOTE — Patient Outreach (Signed)
 Care Coordination   Follow Up Visit Note   10/14/2023 Name: Kathy Frank MRN: [id number] DOB: 09/07/1975  Kathy Frank is a 48 y.o. year old female who sees Tawkaliyar, Roya, DO for primary care. I spoke with  Kathy Frank by phone today.  What matters to the patients health and wellness today?  Kathy Frank said her blood pressure fluctuates, but she denies any headaches, Chest pain, or swelling. We reviewed her medications due to some changes in her blood pressure meds. She could not give me any of her numbers, but she will start to write them down. I advised them to continue her current regimen.     Goals Addressed             This Visit's Progress    CCM Expected Outcome:  Monitor, Self-Manage, and Reduce Symptoms of Hypertension       Patient Goals/Self Care Activities: -Patient/Caregiver will take medications as prescribed   -Patient/Caregiver will attend all scheduled provider appointments -Patient/Caregiver will call pharmacy for medication refills 3-7 days in advance of running out of medications -Patient/Caregiver will call provider office for new concerns or questions  -Patient/Caregiver will focus on medication adherence by taking medications as prescribed   Last practice recorded BP readings:  BP Readings from Last 3 Encounters:  05/21/23 (!) 151/77  05/18/23 (!) 207/95  05/10/23 (!) 179/57   Provided education to patient re: stroke prevention, s/s of heart attack and stroke Reviewed medications with patient and discussed importance of compliance Discussed the purchase of blood pressure monitor         SDOH assessments and interventions completed:  No     Care Coordination Interventions:  Yes, provided   Interventions Today    Flowsheet Row Most Recent Value  Chronic Disease   Chronic disease during today's visit Hypertension (HTN)  General Interventions   General Interventions Discussed/Reviewed General Interventions Discussed  Pharmacy  Interventions   Pharmacy Dicussed/Reviewed Pharmacy Topics Discussed  Safety Interventions   Safety Discussed/Reviewed Safety Discussed        Kathy Fairly RN, BSN, [facility] [place]  [facility], [facility]  Care Coordinator Phone: [phone]      Follow up plan: Follow up call scheduled for 11/17/23  1130 am    Encounter Outcome:  Patient Visit Completed

## 2023-10-16 DIAGNOSIS — E1129 Type 2 diabetes mellitus with other diabetic kidney complication: Secondary | ICD-10-CM | POA: Diagnosis not present

## 2023-10-16 DIAGNOSIS — Z992 Dependence on renal dialysis: Secondary | ICD-10-CM | POA: Diagnosis not present

## 2023-10-16 DIAGNOSIS — D631 Anemia in chronic kidney disease: Secondary | ICD-10-CM | POA: Diagnosis not present

## 2023-10-16 DIAGNOSIS — N2581 Secondary hyperparathyroidism of renal origin: Secondary | ICD-10-CM | POA: Diagnosis not present

## 2023-10-16 DIAGNOSIS — E875 Hyperkalemia: Secondary | ICD-10-CM | POA: Diagnosis not present

## 2023-10-16 DIAGNOSIS — N186 End stage renal disease: Secondary | ICD-10-CM | POA: Diagnosis not present

## 2023-10-18 ENCOUNTER — Ambulatory Visit: Payer: 59 | Admitting: Infectious Diseases

## 2023-10-19 DIAGNOSIS — E875 Hyperkalemia: Secondary | ICD-10-CM | POA: Diagnosis not present

## 2023-10-19 DIAGNOSIS — N2581 Secondary hyperparathyroidism of renal origin: Secondary | ICD-10-CM | POA: Diagnosis not present

## 2023-10-19 DIAGNOSIS — N186 End stage renal disease: Secondary | ICD-10-CM | POA: Diagnosis not present

## 2023-10-19 DIAGNOSIS — Z992 Dependence on renal dialysis: Secondary | ICD-10-CM | POA: Diagnosis not present

## 2023-10-19 DIAGNOSIS — D631 Anemia in chronic kidney disease: Secondary | ICD-10-CM | POA: Diagnosis not present

## 2023-10-19 DIAGNOSIS — E1129 Type 2 diabetes mellitus with other diabetic kidney complication: Secondary | ICD-10-CM | POA: Diagnosis not present

## 2023-10-21 DIAGNOSIS — E875 Hyperkalemia: Secondary | ICD-10-CM | POA: Diagnosis not present

## 2023-10-21 DIAGNOSIS — D631 Anemia in chronic kidney disease: Secondary | ICD-10-CM | POA: Diagnosis not present

## 2023-10-21 DIAGNOSIS — N186 End stage renal disease: Secondary | ICD-10-CM | POA: Diagnosis not present

## 2023-10-21 DIAGNOSIS — E1129 Type 2 diabetes mellitus with other diabetic kidney complication: Secondary | ICD-10-CM | POA: Diagnosis not present

## 2023-10-21 DIAGNOSIS — N2581 Secondary hyperparathyroidism of renal origin: Secondary | ICD-10-CM | POA: Diagnosis not present

## 2023-10-21 DIAGNOSIS — Z992 Dependence on renal dialysis: Secondary | ICD-10-CM | POA: Diagnosis not present

## 2023-10-23 DIAGNOSIS — N186 End stage renal disease: Secondary | ICD-10-CM | POA: Diagnosis not present

## 2023-10-23 DIAGNOSIS — D631 Anemia in chronic kidney disease: Secondary | ICD-10-CM | POA: Diagnosis not present

## 2023-10-23 DIAGNOSIS — Z992 Dependence on renal dialysis: Secondary | ICD-10-CM | POA: Diagnosis not present

## 2023-10-23 DIAGNOSIS — E1129 Type 2 diabetes mellitus with other diabetic kidney complication: Secondary | ICD-10-CM | POA: Diagnosis not present

## 2023-10-23 DIAGNOSIS — E875 Hyperkalemia: Secondary | ICD-10-CM | POA: Diagnosis not present

## 2023-10-23 DIAGNOSIS — N2581 Secondary hyperparathyroidism of renal origin: Secondary | ICD-10-CM | POA: Diagnosis not present

## 2023-10-25 ENCOUNTER — Other Ambulatory Visit: Payer: Self-pay

## 2023-10-25 ENCOUNTER — Other Ambulatory Visit

## 2023-10-25 ENCOUNTER — Encounter

## 2023-10-25 DIAGNOSIS — R2990 Unspecified symptoms and signs involving the nervous system: Secondary | ICD-10-CM | POA: Diagnosis not present

## 2023-10-25 DIAGNOSIS — T879 Unspecified complications of amputation stump: Secondary | ICD-10-CM | POA: Diagnosis not present

## 2023-10-25 NOTE — Progress Notes (Signed)
 Specialty Pharmacy Refill Coordination Note  Kathy Frank is a 48 y.o. female contacted today regarding refills of specialty medication(s) Bictegravir-Emtricitab-Tenofov [name]   Patient requested Delivery   Delivery date: 11/01/23   Verified address: [address] [place] [address]   Medication will be filled on 10/29/23.

## 2023-10-26 DIAGNOSIS — Z992 Dependence on renal dialysis: Secondary | ICD-10-CM | POA: Diagnosis not present

## 2023-10-26 DIAGNOSIS — D631 Anemia in chronic kidney disease: Secondary | ICD-10-CM | POA: Diagnosis not present

## 2023-10-26 DIAGNOSIS — N2581 Secondary hyperparathyroidism of renal origin: Secondary | ICD-10-CM | POA: Diagnosis not present

## 2023-10-26 DIAGNOSIS — N186 End stage renal disease: Secondary | ICD-10-CM | POA: Diagnosis not present

## 2023-10-26 DIAGNOSIS — E875 Hyperkalemia: Secondary | ICD-10-CM | POA: Diagnosis not present

## 2023-10-26 DIAGNOSIS — E1129 Type 2 diabetes mellitus with other diabetic kidney complication: Secondary | ICD-10-CM | POA: Diagnosis not present

## 2023-10-28 DIAGNOSIS — D631 Anemia in chronic kidney disease: Secondary | ICD-10-CM | POA: Diagnosis not present

## 2023-10-28 DIAGNOSIS — N186 End stage renal disease: Secondary | ICD-10-CM | POA: Diagnosis not present

## 2023-10-28 DIAGNOSIS — E1129 Type 2 diabetes mellitus with other diabetic kidney complication: Secondary | ICD-10-CM | POA: Diagnosis not present

## 2023-10-28 DIAGNOSIS — Z992 Dependence on renal dialysis: Secondary | ICD-10-CM | POA: Diagnosis not present

## 2023-10-28 DIAGNOSIS — E875 Hyperkalemia: Secondary | ICD-10-CM | POA: Diagnosis not present

## 2023-10-28 DIAGNOSIS — N2581 Secondary hyperparathyroidism of renal origin: Secondary | ICD-10-CM | POA: Diagnosis not present

## 2023-10-30 DIAGNOSIS — E1129 Type 2 diabetes mellitus with other diabetic kidney complication: Secondary | ICD-10-CM | POA: Diagnosis not present

## 2023-10-30 DIAGNOSIS — N186 End stage renal disease: Secondary | ICD-10-CM | POA: Diagnosis not present

## 2023-10-30 DIAGNOSIS — N2581 Secondary hyperparathyroidism of renal origin: Secondary | ICD-10-CM | POA: Diagnosis not present

## 2023-10-30 DIAGNOSIS — E875 Hyperkalemia: Secondary | ICD-10-CM | POA: Diagnosis not present

## 2023-10-30 DIAGNOSIS — D631 Anemia in chronic kidney disease: Secondary | ICD-10-CM | POA: Diagnosis not present

## 2023-10-30 DIAGNOSIS — Z992 Dependence on renal dialysis: Secondary | ICD-10-CM | POA: Diagnosis not present

## 2023-11-01 ENCOUNTER — Other Ambulatory Visit: Payer: Self-pay | Admitting: Student

## 2023-11-04 DIAGNOSIS — T879 Unspecified complications of amputation stump: Secondary | ICD-10-CM | POA: Diagnosis not present

## 2023-11-04 DIAGNOSIS — D631 Anemia in chronic kidney disease: Secondary | ICD-10-CM | POA: Diagnosis not present

## 2023-11-04 DIAGNOSIS — N186 End stage renal disease: Secondary | ICD-10-CM | POA: Diagnosis not present

## 2023-11-04 DIAGNOSIS — E1129 Type 2 diabetes mellitus with other diabetic kidney complication: Secondary | ICD-10-CM | POA: Diagnosis not present

## 2023-11-04 DIAGNOSIS — Z89612 Acquired absence of left leg above knee: Secondary | ICD-10-CM | POA: Diagnosis not present

## 2023-11-04 DIAGNOSIS — N2581 Secondary hyperparathyroidism of renal origin: Secondary | ICD-10-CM | POA: Diagnosis not present

## 2023-11-04 DIAGNOSIS — Z992 Dependence on renal dialysis: Secondary | ICD-10-CM | POA: Diagnosis not present

## 2023-11-04 DIAGNOSIS — E875 Hyperkalemia: Secondary | ICD-10-CM | POA: Diagnosis not present

## 2023-11-06 DIAGNOSIS — D631 Anemia in chronic kidney disease: Secondary | ICD-10-CM | POA: Diagnosis not present

## 2023-11-06 DIAGNOSIS — N2581 Secondary hyperparathyroidism of renal origin: Secondary | ICD-10-CM | POA: Diagnosis not present

## 2023-11-06 DIAGNOSIS — N186 End stage renal disease: Secondary | ICD-10-CM | POA: Diagnosis not present

## 2023-11-06 DIAGNOSIS — E875 Hyperkalemia: Secondary | ICD-10-CM | POA: Diagnosis not present

## 2023-11-06 DIAGNOSIS — E1129 Type 2 diabetes mellitus with other diabetic kidney complication: Secondary | ICD-10-CM | POA: Diagnosis not present

## 2023-11-06 DIAGNOSIS — Z992 Dependence on renal dialysis: Secondary | ICD-10-CM | POA: Diagnosis not present

## 2023-11-08 DIAGNOSIS — N186 End stage renal disease: Secondary | ICD-10-CM | POA: Diagnosis not present

## 2023-11-08 DIAGNOSIS — E1129 Type 2 diabetes mellitus with other diabetic kidney complication: Secondary | ICD-10-CM | POA: Diagnosis not present

## 2023-11-08 DIAGNOSIS — Z992 Dependence on renal dialysis: Secondary | ICD-10-CM | POA: Diagnosis not present

## 2023-11-09 DIAGNOSIS — R519 Headache, unspecified: Secondary | ICD-10-CM | POA: Diagnosis not present

## 2023-11-09 DIAGNOSIS — Z992 Dependence on renal dialysis: Secondary | ICD-10-CM | POA: Diagnosis not present

## 2023-11-09 DIAGNOSIS — R197 Diarrhea, unspecified: Secondary | ICD-10-CM | POA: Diagnosis not present

## 2023-11-09 DIAGNOSIS — E1110 Type 2 diabetes mellitus with ketoacidosis without coma: Secondary | ICD-10-CM | POA: Diagnosis not present

## 2023-11-09 DIAGNOSIS — N186 End stage renal disease: Secondary | ICD-10-CM | POA: Diagnosis not present

## 2023-11-09 DIAGNOSIS — N2581 Secondary hyperparathyroidism of renal origin: Secondary | ICD-10-CM | POA: Diagnosis not present

## 2023-11-09 DIAGNOSIS — L299 Pruritus, unspecified: Secondary | ICD-10-CM | POA: Diagnosis not present

## 2023-11-09 DIAGNOSIS — D631 Anemia in chronic kidney disease: Secondary | ICD-10-CM | POA: Diagnosis not present

## 2023-11-09 DIAGNOSIS — E875 Hyperkalemia: Secondary | ICD-10-CM | POA: Diagnosis not present

## 2023-11-11 DIAGNOSIS — N2581 Secondary hyperparathyroidism of renal origin: Secondary | ICD-10-CM | POA: Diagnosis not present

## 2023-11-11 DIAGNOSIS — E1110 Type 2 diabetes mellitus with ketoacidosis without coma: Secondary | ICD-10-CM | POA: Diagnosis not present

## 2023-11-11 DIAGNOSIS — D631 Anemia in chronic kidney disease: Secondary | ICD-10-CM | POA: Diagnosis not present

## 2023-11-11 DIAGNOSIS — E875 Hyperkalemia: Secondary | ICD-10-CM | POA: Diagnosis not present

## 2023-11-11 DIAGNOSIS — R519 Headache, unspecified: Secondary | ICD-10-CM | POA: Diagnosis not present

## 2023-11-11 DIAGNOSIS — N186 End stage renal disease: Secondary | ICD-10-CM | POA: Diagnosis not present

## 2023-11-11 DIAGNOSIS — R197 Diarrhea, unspecified: Secondary | ICD-10-CM | POA: Diagnosis not present

## 2023-11-11 DIAGNOSIS — L299 Pruritus, unspecified: Secondary | ICD-10-CM | POA: Diagnosis not present

## 2023-11-11 DIAGNOSIS — Z992 Dependence on renal dialysis: Secondary | ICD-10-CM | POA: Diagnosis not present

## 2023-11-13 DIAGNOSIS — R519 Headache, unspecified: Secondary | ICD-10-CM | POA: Diagnosis not present

## 2023-11-13 DIAGNOSIS — L299 Pruritus, unspecified: Secondary | ICD-10-CM | POA: Diagnosis not present

## 2023-11-13 DIAGNOSIS — E1110 Type 2 diabetes mellitus with ketoacidosis without coma: Secondary | ICD-10-CM | POA: Diagnosis not present

## 2023-11-13 DIAGNOSIS — D631 Anemia in chronic kidney disease: Secondary | ICD-10-CM | POA: Diagnosis not present

## 2023-11-13 DIAGNOSIS — E875 Hyperkalemia: Secondary | ICD-10-CM | POA: Diagnosis not present

## 2023-11-13 DIAGNOSIS — N186 End stage renal disease: Secondary | ICD-10-CM | POA: Diagnosis not present

## 2023-11-13 DIAGNOSIS — N2581 Secondary hyperparathyroidism of renal origin: Secondary | ICD-10-CM | POA: Diagnosis not present

## 2023-11-13 DIAGNOSIS — R197 Diarrhea, unspecified: Secondary | ICD-10-CM | POA: Diagnosis not present

## 2023-11-13 DIAGNOSIS — Z992 Dependence on renal dialysis: Secondary | ICD-10-CM | POA: Diagnosis not present

## 2023-11-16 DIAGNOSIS — N2581 Secondary hyperparathyroidism of renal origin: Secondary | ICD-10-CM | POA: Diagnosis not present

## 2023-11-16 DIAGNOSIS — R197 Diarrhea, unspecified: Secondary | ICD-10-CM | POA: Diagnosis not present

## 2023-11-16 DIAGNOSIS — L299 Pruritus, unspecified: Secondary | ICD-10-CM | POA: Diagnosis not present

## 2023-11-16 DIAGNOSIS — E1110 Type 2 diabetes mellitus with ketoacidosis without coma: Secondary | ICD-10-CM | POA: Diagnosis not present

## 2023-11-16 DIAGNOSIS — E875 Hyperkalemia: Secondary | ICD-10-CM | POA: Diagnosis not present

## 2023-11-16 DIAGNOSIS — R519 Headache, unspecified: Secondary | ICD-10-CM | POA: Diagnosis not present

## 2023-11-16 DIAGNOSIS — D631 Anemia in chronic kidney disease: Secondary | ICD-10-CM | POA: Diagnosis not present

## 2023-11-16 DIAGNOSIS — N186 End stage renal disease: Secondary | ICD-10-CM | POA: Diagnosis not present

## 2023-11-16 DIAGNOSIS — Z992 Dependence on renal dialysis: Secondary | ICD-10-CM | POA: Diagnosis not present

## 2023-11-18 ENCOUNTER — Other Ambulatory Visit (HOSPITAL_COMMUNITY): Payer: Self-pay

## 2023-11-19 ENCOUNTER — Other Ambulatory Visit: Payer: Self-pay | Admitting: Student

## 2023-11-19 MED ORDER — AMLODIPINE-OLMESARTAN 10-40 MG PO TABS: 1.0000 | ORAL_TABLET | Freq: Every day | ORAL | 3 refills | Status: AC

## 2023-11-19 NOTE — Telephone Encounter (Signed)
 Copied from CRM #[id number]. Topic: Clinical - Medication Refill >> Nov 19, 2023  9:24 AM Irine Seal wrote: Most Recent Primary Care Visit:  Provider: Rudene Christians  Department: [facility]  Visit Type: TELEPHONE OFFICE VISIT  Date: 06/11/2023  Medication:  escitalopram (LEXAPRO) 5 MG tablet  amLODipine-olmesartan (AZOR) 10-40 MG tablet insulin glargine (LANTUS SOLOSTAR) 100 UNIT/ML Solostar Pen  atorvastatin (LIPITOR) 80 MG tablet   Has the patient contacted their pharmacy? Yes Pharmacy called on behalf of the patient    Is this the correct pharmacy for this prescription? Yes If no, delete pharmacy and type the correct one.  This is the patient's preferred pharmacy:  [facility] [place] [address] [place] [address] Phone: [phone] Fax: [phone]   Has the prescription been filled recently? No  Is the patient out of the medication? Yes  Has the patient been seen for an appointment in the last year OR does the patient have an upcoming appointment? Yes  Can we respond through MyChart? Yes  Agent: Please be advised that Rx refills may take up to 3 business days. We ask that you follow-up with your pharmacy.

## 2023-11-19 NOTE — Telephone Encounter (Signed)
 Pt requests numerous meds refilled, refills remain.

## 2023-11-20 DIAGNOSIS — R197 Diarrhea, unspecified: Secondary | ICD-10-CM | POA: Diagnosis not present

## 2023-11-20 DIAGNOSIS — E875 Hyperkalemia: Secondary | ICD-10-CM | POA: Diagnosis not present

## 2023-11-20 DIAGNOSIS — N186 End stage renal disease: Secondary | ICD-10-CM | POA: Diagnosis not present

## 2023-11-20 DIAGNOSIS — E1110 Type 2 diabetes mellitus with ketoacidosis without coma: Secondary | ICD-10-CM | POA: Diagnosis not present

## 2023-11-20 DIAGNOSIS — N2581 Secondary hyperparathyroidism of renal origin: Secondary | ICD-10-CM | POA: Diagnosis not present

## 2023-11-20 DIAGNOSIS — D631 Anemia in chronic kidney disease: Secondary | ICD-10-CM | POA: Diagnosis not present

## 2023-11-20 DIAGNOSIS — L299 Pruritus, unspecified: Secondary | ICD-10-CM | POA: Diagnosis not present

## 2023-11-20 DIAGNOSIS — R519 Headache, unspecified: Secondary | ICD-10-CM | POA: Diagnosis not present

## 2023-11-20 DIAGNOSIS — Z992 Dependence on renal dialysis: Secondary | ICD-10-CM | POA: Diagnosis not present

## 2023-11-22 ENCOUNTER — Encounter: Admitting: Student

## 2023-11-22 ENCOUNTER — Other Ambulatory Visit: Payer: Self-pay

## 2023-11-22 ENCOUNTER — Other Ambulatory Visit: Payer: Self-pay | Admitting: Infectious Diseases

## 2023-11-22 ENCOUNTER — Encounter

## 2023-11-22 ENCOUNTER — Other Ambulatory Visit

## 2023-11-22 DIAGNOSIS — B20 Human immunodeficiency virus [HIV] disease: Secondary | ICD-10-CM

## 2023-11-22 MED ORDER — BIKTARVY 50-200-25 MG PO TABS
1.0000 | ORAL_TABLET | Freq: Every day | ORAL | 0 refills | Status: DC
  Filled 2023-11-22: qty 30, 30d supply, fill #0

## 2023-11-22 NOTE — Progress Notes (Signed)
 Specialty Pharmacy Ongoing Clinical Assessment Note  Kathy Frank is a 48 y.o. female who is being followed by the specialty pharmacy service for RxSp HIV   Patient's specialty medication(s) reviewed today: Bictegravir-Emtricitab-Tenofov (BIKTARVY)   Missed doses in the last 4 weeks: 0   Patient/Caregiver did not have any additional questions or concerns.   Therapeutic benefit summary: Patient is achieving benefit   Adverse events/side effects summary: No adverse events/side effects   Patient's therapy is appropriate to: Continue    Goals Addressed             This Visit's Progress    Achieve Undetectable HIV Viral Load < 20   On track    Patient is on track. Patient will maintain adherence      Increase CD4 count until steady state   On track    Patient is on track. Patient will maintain adherence         Follow up:  6 months  [name] M Norelle Runnion Specialty Pharmacist

## 2023-11-22 NOTE — Progress Notes (Signed)
 Specialty Pharmacy Refill Coordination Note  Kathy Frank is a 48 y.o. female contacted today regarding refills of specialty medication(s) Bictegravir-Emtricitab-Tenofov [name])   Patient requested Delivery   Delivery date: 11/25/23   Verified address: [address]   [place] [address]   Medication will be filled on 11/24/23.   This fill date is pending response to refill request from provider. Patient is aware and if they have not received fill by intended date they must follow up with pharmacy.

## 2023-11-22 NOTE — Progress Notes (Deleted)
   Established Patient Office Visit  Subjective   Patient ID: YAREXI PAWLICKI, female    DOB: [birth year]  Age: 48 y.o. MRN: [id number]  No chief complaint on file.   HPI This is a 48 year old female living with a history stated below and presents today for A1c check. Please see problem based assessment and plan for additional details.   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, PAD s/p bilateral BKA, HTN, HIV, and chronic diarrhea   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   Anemia, posthemorrhagic, acute 10/14/2022   Anxiety    CAD (coronary artery disease)    CAP (community acquired pneumonia) 09/29/2022   CVA (cerebral vascular accident) [facility])    Depression 06/28/2006   Qualifier: Diagnosis of  By: Gregery Leander MD, Todd     Diabetes mellitus type 2 in obese 06/28/1994   Dyspnea    uses oxygeb 2 liters per minute at dialysis   End stage renal disease on dialysis [facility])    on hemodialysis T/Th/Sat   Erosive esophagitis    Esophageal reflux    Eye redness    Fecal incontinence 02/04/2018   Gastroparesis    ? diabetic   GERD (gastroesophageal reflux disease)    Human immunodeficiency virus (HIV) disease (HCC) 04/23/2016   Hyperkalemia 10/12/2022   Hyperlipidemia    Hypertension    Hypertensive emergency 11/09/2021   Hypertensive urgency 05/20/2021   Metabolic bone disease 05/02/2019   Moderate nonproliferative diabetic retinopathy of both eyes (HCC) 11/21/2014   11/14/14: Noted on retinal imaging; needs follow-up imaging in 6 months  05/22/16: Noted on retinal imaging again; needs follow-up imaging in 6 months   PAD (peripheral artery disease) (HCC)    Type 2 diabetes mellitus with diabetic peripheral angiopathy without gangrene (HCC) 05/01/2019   Wound infection s/p L transmetatarsal amputation       ROS    Objective:     There were no vitals  taken for this visit. BP Readings from Last 3 Encounters:  05/21/23 (!) 151/77  05/18/23 (!) 207/95  05/10/23 (!) 179/57   Wt Readings from Last 3 Encounters:  05/21/23 135 lb (61.2 kg)  05/18/23 135 lb (61.2 kg)  04/29/23 (S) 135 lb 5.8 oz (61.4 kg)   SpO2 Readings from Last 3 Encounters:  05/18/23 97%  05/10/23 100%  04/29/23 100%      Physical Exam   No results found for any visits on 11/22/23.  {Labs (Optional):23779}  The ASCVD Risk score (Arnett DK, et al., 2019) failed to calculate for the following reasons:   Risk score cannot be calculated because patient has a medical history suggesting prior/existing ASCVD    Assessment & Plan:  DM Lab Results  Component Value Date   HGBA1C 6.1 (A) 03/02/2023   OP medication regimen Lantus 15 units daily and Victoza 0.6 mg daily     Problem List Items Addressed This Visit   None   No follow-ups on file.    Lanney Pitts, DO

## 2023-11-23 DIAGNOSIS — N2581 Secondary hyperparathyroidism of renal origin: Secondary | ICD-10-CM | POA: Diagnosis not present

## 2023-11-23 DIAGNOSIS — Z992 Dependence on renal dialysis: Secondary | ICD-10-CM | POA: Diagnosis not present

## 2023-11-23 DIAGNOSIS — D631 Anemia in chronic kidney disease: Secondary | ICD-10-CM | POA: Diagnosis not present

## 2023-11-23 DIAGNOSIS — L299 Pruritus, unspecified: Secondary | ICD-10-CM | POA: Diagnosis not present

## 2023-11-23 DIAGNOSIS — R197 Diarrhea, unspecified: Secondary | ICD-10-CM | POA: Diagnosis not present

## 2023-11-23 DIAGNOSIS — E1110 Type 2 diabetes mellitus with ketoacidosis without coma: Secondary | ICD-10-CM | POA: Diagnosis not present

## 2023-11-23 DIAGNOSIS — N186 End stage renal disease: Secondary | ICD-10-CM | POA: Diagnosis not present

## 2023-11-23 DIAGNOSIS — R519 Headache, unspecified: Secondary | ICD-10-CM | POA: Diagnosis not present

## 2023-11-23 DIAGNOSIS — E875 Hyperkalemia: Secondary | ICD-10-CM | POA: Diagnosis not present

## 2023-11-24 ENCOUNTER — Other Ambulatory Visit: Payer: Self-pay

## 2023-11-25 DIAGNOSIS — R2990 Unspecified symptoms and signs involving the nervous system: Secondary | ICD-10-CM | POA: Diagnosis not present

## 2023-11-25 DIAGNOSIS — T879 Unspecified complications of amputation stump: Secondary | ICD-10-CM | POA: Diagnosis not present

## 2023-11-27 ENCOUNTER — Observation Stay (HOSPITAL_COMMUNITY)
Admission: EM | Admit: 2023-11-27 | Discharge: 2023-11-29 | Disposition: A | Discharge status: Home / Self Care | Attending: Internal Medicine | Admitting: Internal Medicine

## 2023-11-27 ENCOUNTER — Emergency Department (HOSPITAL_COMMUNITY)

## 2023-11-27 ENCOUNTER — Encounter (HOSPITAL_COMMUNITY): Payer: Self-pay | Admitting: *Deleted

## 2023-11-27 ENCOUNTER — Other Ambulatory Visit: Payer: Self-pay

## 2023-11-27 DIAGNOSIS — Z794 Long term (current) use of insulin: Secondary | ICD-10-CM | POA: Diagnosis not present

## 2023-11-27 DIAGNOSIS — E875 Hyperkalemia: Secondary | ICD-10-CM | POA: Diagnosis not present

## 2023-11-27 DIAGNOSIS — I10 Essential (primary) hypertension: Secondary | ICD-10-CM | POA: Diagnosis not present

## 2023-11-27 DIAGNOSIS — Z955 Presence of coronary angioplasty implant and graft: Secondary | ICD-10-CM | POA: Insufficient documentation

## 2023-11-27 DIAGNOSIS — I700 Atherosclerosis of aorta: Secondary | ICD-10-CM | POA: Diagnosis not present

## 2023-11-27 DIAGNOSIS — I5031 Acute diastolic (congestive) heart failure: Secondary | ICD-10-CM | POA: Diagnosis not present

## 2023-11-27 DIAGNOSIS — E1110 Type 2 diabetes mellitus with ketoacidosis without coma: Secondary | ICD-10-CM | POA: Diagnosis not present

## 2023-11-27 DIAGNOSIS — I132 Hypertensive heart and chronic kidney disease with heart failure and with stage 5 chronic kidney disease, or end stage renal disease: Secondary | ICD-10-CM | POA: Insufficient documentation

## 2023-11-27 DIAGNOSIS — Z992 Dependence on renal dialysis: Secondary | ICD-10-CM | POA: Insufficient documentation

## 2023-11-27 DIAGNOSIS — I3481 Nonrheumatic mitral (valve) annulus calcification: Secondary | ICD-10-CM | POA: Diagnosis not present

## 2023-11-27 DIAGNOSIS — R197 Diarrhea, unspecified: Secondary | ICD-10-CM | POA: Diagnosis not present

## 2023-11-27 DIAGNOSIS — Z79899 Other long term (current) drug therapy: Secondary | ICD-10-CM | POA: Insufficient documentation

## 2023-11-27 DIAGNOSIS — R0602 Shortness of breath: Principal | ICD-10-CM

## 2023-11-27 DIAGNOSIS — N186 End stage renal disease: Principal | ICD-10-CM

## 2023-11-27 DIAGNOSIS — Z8673 Personal history of transient ischemic attack (TIA), and cerebral infarction without residual deficits: Secondary | ICD-10-CM | POA: Diagnosis not present

## 2023-11-27 DIAGNOSIS — Z87891 Personal history of nicotine dependence: Secondary | ICD-10-CM | POA: Diagnosis not present

## 2023-11-27 DIAGNOSIS — E440 Moderate protein-calorie malnutrition: Secondary | ICD-10-CM | POA: Diagnosis not present

## 2023-11-27 DIAGNOSIS — Z89512 Acquired absence of left leg below knee: Secondary | ICD-10-CM | POA: Diagnosis not present

## 2023-11-27 DIAGNOSIS — R519 Headache, unspecified: Secondary | ICD-10-CM | POA: Diagnosis not present

## 2023-11-27 DIAGNOSIS — G43109 Migraine with aura, not intractable, without status migrainosus: Secondary | ICD-10-CM | POA: Diagnosis present

## 2023-11-27 DIAGNOSIS — R7989 Other specified abnormal findings of blood chemistry: Secondary | ICD-10-CM

## 2023-11-27 DIAGNOSIS — E1122 Type 2 diabetes mellitus with diabetic chronic kidney disease: Secondary | ICD-10-CM | POA: Diagnosis not present

## 2023-11-27 DIAGNOSIS — Z89511 Acquired absence of right leg below knee: Secondary | ICD-10-CM | POA: Diagnosis not present

## 2023-11-27 DIAGNOSIS — E785 Hyperlipidemia, unspecified: Secondary | ICD-10-CM | POA: Insufficient documentation

## 2023-11-27 DIAGNOSIS — F341 Dysthymic disorder: Secondary | ICD-10-CM | POA: Insufficient documentation

## 2023-11-27 DIAGNOSIS — Z7982 Long term (current) use of aspirin: Secondary | ICD-10-CM | POA: Diagnosis not present

## 2023-11-27 DIAGNOSIS — D631 Anemia in chronic kidney disease: Secondary | ICD-10-CM | POA: Diagnosis not present

## 2023-11-27 DIAGNOSIS — R9431 Abnormal electrocardiogram [ECG] [EKG]: Secondary | ICD-10-CM | POA: Diagnosis not present

## 2023-11-27 DIAGNOSIS — G43909 Migraine, unspecified, not intractable, without status migrainosus: Secondary | ICD-10-CM | POA: Diagnosis not present

## 2023-11-27 DIAGNOSIS — L299 Pruritus, unspecified: Secondary | ICD-10-CM | POA: Diagnosis not present

## 2023-11-27 DIAGNOSIS — I517 Cardiomegaly: Secondary | ICD-10-CM | POA: Diagnosis not present

## 2023-11-27 DIAGNOSIS — F32A Depression, unspecified: Secondary | ICD-10-CM | POA: Diagnosis present

## 2023-11-27 DIAGNOSIS — E119 Type 2 diabetes mellitus without complications: Secondary | ICD-10-CM

## 2023-11-27 DIAGNOSIS — I2510 Atherosclerotic heart disease of native coronary artery without angina pectoris: Secondary | ICD-10-CM | POA: Diagnosis not present

## 2023-11-27 DIAGNOSIS — B20 Human immunodeficiency virus [HIV] disease: Secondary | ICD-10-CM | POA: Diagnosis not present

## 2023-11-27 DIAGNOSIS — R0902 Hypoxemia: Secondary | ICD-10-CM | POA: Diagnosis not present

## 2023-11-27 DIAGNOSIS — R778 Other specified abnormalities of plasma proteins: Secondary | ICD-10-CM | POA: Diagnosis not present

## 2023-11-27 DIAGNOSIS — N2581 Secondary hyperparathyroidism of renal origin: Secondary | ICD-10-CM | POA: Diagnosis not present

## 2023-11-27 DIAGNOSIS — I6782 Cerebral ischemia: Secondary | ICD-10-CM | POA: Diagnosis not present

## 2023-11-27 LAB — COMPREHENSIVE METABOLIC PANEL WITH GFR
ALT: 7 U/L (ref 0–44)
AST: 12 U/L — ABNORMAL LOW (ref 15–41)
Albumin: 3.5 g/dL (ref 3.5–5.0)
Alkaline Phosphatase: 52 U/L (ref 38–126)
Anion gap: 20 — ABNORMAL HIGH (ref 5–15)
BUN: 28 mg/dL — ABNORMAL HIGH (ref 6–20)
CO2: 22 mmol/L (ref 22–32)
Calcium: 9.8 mg/dL (ref 8.9–10.3)
Chloride: 95 mmol/L — ABNORMAL LOW (ref 98–111)
Creatinine, Ser: 6.99 mg/dL — ABNORMAL HIGH (ref 0.44–1.00)
GFR, Estimated: 7 mL/min — ABNORMAL LOW (ref >60)
Glucose, Bld: 131 mg/dL — ABNORMAL HIGH (ref 70–99)
Potassium: 3.9 mmol/L (ref 3.5–5.1)
Sodium: 137 mmol/L (ref 135–145)
Total Bilirubin: 0.6 mg/dL (ref 0.0–1.2)
Total Protein: 9 g/dL — ABNORMAL HIGH (ref 6.5–8.1)

## 2023-11-27 LAB — CBC WITH DIFFERENTIAL/PLATELET
Abs Immature Granulocytes: 0.04 10*3/uL (ref 0.00–0.07)
Basophils Absolute: 0.1 10*3/uL (ref 0.0–0.1)
Basophils Relative: 1 %
Eosinophils Absolute: 0.3 10*3/uL (ref 0.0–0.5)
Eosinophils Relative: 4 %
HCT: 37.9 % (ref 36.0–46.0)
Hemoglobin: 11.9 g/dL — ABNORMAL LOW (ref 12.0–15.0)
Immature Granulocytes: 1 %
Lymphocytes Relative: 15 %
Lymphs Abs: 1 10*3/uL (ref 0.7–4.0)
MCH: 29.4 pg (ref 26.0–34.0)
MCHC: 31.4 g/dL (ref 30.0–36.0)
MCV: 93.6 fL (ref 80.0–100.0)
Monocytes Absolute: 0.5 10*3/uL (ref 0.1–1.0)
Monocytes Relative: 8 %
Neutro Abs: 4.6 10*3/uL (ref 1.7–7.7)
Neutrophils Relative %: 71 %
Platelets: 313 10*3/uL (ref 150–400)
RBC: 4.05 MIL/uL (ref 3.87–5.11)
RDW: 15 % (ref 11.5–15.5)
WBC: 6.4 10*3/uL (ref 4.0–10.5)
nRBC: 0 % (ref 0.0–0.2)

## 2023-11-27 LAB — TROPONIN I (HIGH SENSITIVITY)
Troponin I (High Sensitivity): 66 ng/L — ABNORMAL HIGH (ref <18)
Troponin I (High Sensitivity): 65 ng/L — ABNORMAL HIGH (ref <18)

## 2023-11-27 LAB — CBC
HCT: 35.5 % — ABNORMAL LOW (ref 36.0–46.0)
Hemoglobin: 11.3 g/dL — ABNORMAL LOW (ref 12.0–15.0)
MCH: 29.8 pg (ref 26.0–34.0)
MCHC: 31.8 g/dL (ref 30.0–36.0)
MCV: 93.7 fL (ref 80.0–100.0)
Platelets: 299 10*3/uL (ref 150–400)
RBC: 3.79 MIL/uL — ABNORMAL LOW (ref 3.87–5.11)
RDW: 15.2 % (ref 11.5–15.5)
WBC: 6.3 10*3/uL (ref 4.0–10.5)
nRBC: 0 % (ref 0.0–0.2)

## 2023-11-27 LAB — HCG, SERUM, QUALITATIVE: Preg, Serum: NEGATIVE

## 2023-11-27 LAB — HEMOGLOBIN A1C
Hgb A1c MFr Bld: 8.9 % — ABNORMAL HIGH (ref 4.8–5.6)
Mean Plasma Glucose: 208.73 mg/dL

## 2023-11-27 LAB — CREATININE, SERUM
Creatinine, Ser: 8.19 mg/dL — ABNORMAL HIGH (ref 0.44–1.00)
GFR, Estimated: 6 mL/min — ABNORMAL LOW (ref >60)

## 2023-11-27 LAB — CBG MONITORING, ED: Glucose-Capillary: 229 mg/dL — ABNORMAL HIGH (ref 70–99)

## 2023-11-27 LAB — BRAIN NATRIURETIC PEPTIDE: B Natriuretic Peptide: 2762.6 pg/mL — ABNORMAL HIGH (ref 0.0–100.0)

## 2023-11-27 LAB — GLUCOSE, CAPILLARY: Glucose-Capillary: 211 mg/dL — ABNORMAL HIGH (ref 70–99)

## 2023-11-27 MED ORDER — INSULIN GLARGINE-YFGN 100 UNIT/ML ~~LOC~~ SOLN
8.0000 [IU] | Freq: Every day | SUBCUTANEOUS | Status: DC
  Administered 2023-11-28: 8 [IU] via SUBCUTANEOUS
  Filled 2023-11-27 (×4): qty 0.08

## 2023-11-27 MED ORDER — PSYLLIUM 95 % PO PACK
1.0000 | PACK | Freq: Every day | ORAL | Status: DC
  Administered 2023-11-28: 1 via ORAL
  Filled 2023-11-27: qty 1

## 2023-11-27 MED ORDER — ACETAMINOPHEN 650 MG RE SUPP: 650.0000 mg | Freq: Four times a day (QID) | RECTAL | Status: DC | PRN

## 2023-11-27 MED ORDER — LIRAGLUTIDE 18 MG/3ML ~~LOC~~ SOPN
0.6000 mg | PEN_INJECTOR | Freq: Every day | SUBCUTANEOUS | Status: DC
Start: 2023-11-27 — End: 2023-11-27

## 2023-11-27 MED ORDER — CALCITRIOL 0.5 MCG PO CAPS: 1.2500 ug | ORAL_CAPSULE | ORAL | Status: DC

## 2023-11-27 MED ORDER — ROPINIROLE HCL 0.25 MG PO TABS
0.2500 mg | ORAL_TABLET | Freq: Every day | ORAL | Status: DC
  Administered 2023-11-27 – 2023-11-28 (×2): 0.25 mg via ORAL
  Filled 2023-11-27 (×3): qty 1

## 2023-11-27 MED ORDER — ALBUTEROL SULFATE (2.5 MG/3ML) 0.083% IN NEBU: 2.5000 mg | INHALATION_SOLUTION | RESPIRATORY_TRACT | Status: DC | PRN

## 2023-11-27 MED ORDER — ALBUTEROL SULFATE HFA 108 (90 BASE) MCG/ACT IN AERS
2.0000 | INHALATION_SPRAY | RESPIRATORY_TRACT | Status: DC | PRN
Start: 2023-11-27 — End: 2023-11-27

## 2023-11-27 MED ORDER — BICTEGRAVIR-EMTRICITAB-TENOFOV 50-200-25 MG PO TABS
1.0000 | ORAL_TABLET | Freq: Every day | ORAL | Status: DC
  Administered 2023-11-28 – 2023-11-29 (×2): 1 via ORAL
  Filled 2023-11-27 (×2): qty 1

## 2023-11-27 MED ORDER — BUTALBITAL-APAP-CAFFEINE 50-325-40 MG PO TABS
1.0000 | ORAL_TABLET | Freq: Once | ORAL | Status: AC
  Administered 2023-11-27: 1 via ORAL
  Filled 2023-11-27: qty 1

## 2023-11-27 MED ORDER — DIPHENHYDRAMINE HCL 50 MG/ML IJ SOLN
25.0000 mg | Freq: Once | INTRAMUSCULAR | Status: AC
  Administered 2023-11-27: 25 mg via INTRAVENOUS
  Filled 2023-11-27: qty 1

## 2023-11-27 MED ORDER — ATORVASTATIN CALCIUM 80 MG PO TABS
80.0000 mg | ORAL_TABLET | Freq: Every day | ORAL | Status: DC
  Administered 2023-11-28: 80 mg via ORAL
  Filled 2023-11-27: qty 1

## 2023-11-27 MED ORDER — IOHEXOL 350 MG/ML SOLN
75.0000 mL | Freq: Once | INTRAVENOUS | Status: AC | PRN
  Administered 2023-11-27: 75 mL via INTRAVENOUS

## 2023-11-27 MED ORDER — PANTOPRAZOLE SODIUM 40 MG PO TBEC
40.0000 mg | DELAYED_RELEASE_TABLET | Freq: Two times a day (BID) | ORAL | Status: DC
  Administered 2023-11-27 – 2023-11-28 (×3): 40 mg via ORAL
  Filled 2023-11-27: qty 1
  Filled 2023-11-27: qty 2
  Filled 2023-11-27: qty 1

## 2023-11-27 MED ORDER — SODIUM ZIRCONIUM CYCLOSILICATE 5 G PO PACK
5.0000 g | PACK | Freq: Every day | ORAL | Status: DC
  Administered 2023-11-28: 5 g via ORAL
  Filled 2023-11-27: qty 1

## 2023-11-27 MED ORDER — RENA-VITE PO TABS
1.0000 | ORAL_TABLET | Freq: Every day | ORAL | Status: DC
Start: 2023-11-27 — End: 2023-11-30
  Administered 2023-11-27 – 2023-11-28 (×2): 1 via ORAL
  Filled 2023-11-27 (×2): qty 1

## 2023-11-27 MED ORDER — KETOROLAC TROMETHAMINE 15 MG/ML IJ SOLN
15.0000 mg | Freq: Once | INTRAMUSCULAR | Status: AC
  Administered 2023-11-28: 15 mg via INTRAVENOUS
  Filled 2023-11-27: qty 1

## 2023-11-27 MED ORDER — HYDRALAZINE HCL 20 MG/ML IJ SOLN: 10.0000 mg | Freq: Four times a day (QID) | INTRAMUSCULAR | Status: DC | PRN

## 2023-11-27 MED ORDER — DEXAMETHASONE SODIUM PHOSPHATE 10 MG/ML IJ SOLN
10.0000 mg | Freq: Once | INTRAMUSCULAR | Status: AC
  Administered 2023-11-28: 10 mg via INTRAVENOUS
  Filled 2023-11-27: qty 1

## 2023-11-27 MED ORDER — PROCHLORPERAZINE EDISYLATE 10 MG/2ML IJ SOLN
10.0000 mg | Freq: Once | INTRAMUSCULAR | Status: AC
  Administered 2023-11-27: 10 mg via INTRAVENOUS
  Filled 2023-11-27: qty 2

## 2023-11-27 MED ORDER — CLOPIDOGREL BISULFATE 75 MG PO TABS: 75.0000 mg | ORAL_TABLET | Freq: Every day | ORAL | Status: DC

## 2023-11-27 MED ORDER — AMLODIPINE-OLMESARTAN 10-40 MG PO TABS: 1.0000 | ORAL_TABLET | Freq: Every day | ORAL | Status: DC

## 2023-11-27 MED ORDER — AMLODIPINE BESYLATE 10 MG PO TABS: 10.0000 mg | ORAL_TABLET | Freq: Every day | ORAL | Status: DC

## 2023-11-27 MED ORDER — HYDROCODONE-ACETAMINOPHEN 5-325 MG PO TABS
1.0000 | ORAL_TABLET | Freq: Once | ORAL | Status: AC
  Administered 2023-11-27: 1 via ORAL
  Filled 2023-11-27: qty 1

## 2023-11-27 MED ORDER — INSULIN ASPART 100 UNIT/ML IJ SOLN
0.0000 [IU] | Freq: Three times a day (TID) | INTRAMUSCULAR | Status: DC
  Administered 2023-11-28: 2 [IU] via SUBCUTANEOUS
  Administered 2023-11-28: 3 [IU] via SUBCUTANEOUS
  Administered 2023-11-28: 4 [IU] via SUBCUTANEOUS

## 2023-11-27 MED ORDER — CYCLOBENZAPRINE HCL 5 MG PO TABS: 5.0000 mg | ORAL_TABLET | Freq: Two times a day (BID) | ORAL | Status: DC | PRN

## 2023-11-27 MED ORDER — HEPARIN SODIUM (PORCINE) 5000 UNIT/ML IJ SOLN
5000.0000 [IU] | Freq: Three times a day (TID) | INTRAMUSCULAR | Status: DC
  Administered 2023-11-27 – 2023-11-29 (×5): 5000 [IU] via SUBCUTANEOUS
  Filled 2023-11-27 (×5): qty 1

## 2023-11-27 MED ORDER — SODIUM CHLORIDE 0.9 % IV SOLN
12.5000 mg | Freq: Once | INTRAVENOUS | Status: DC
  Filled 2023-11-27: qty 0.5

## 2023-11-27 MED ORDER — ESCITALOPRAM OXALATE 10 MG PO TABS
5.0000 mg | ORAL_TABLET | Freq: Every day | ORAL | Status: DC
  Administered 2023-11-28: 5 mg via ORAL
  Filled 2023-11-27: qty 1

## 2023-11-27 MED ORDER — ACETAMINOPHEN 325 MG PO TABS: 650.0000 mg | ORAL_TABLET | Freq: Four times a day (QID) | ORAL | Status: DC | PRN

## 2023-11-27 MED ORDER — CALCIUM ACETATE (PHOS BINDER) 667 MG PO CAPS
1334.0000 mg | ORAL_CAPSULE | Freq: Three times a day (TID) | ORAL | Status: DC
  Administered 2023-11-28 – 2023-11-29 (×5): 1334 mg via ORAL
  Filled 2023-11-27 (×5): qty 2

## 2023-11-27 MED ORDER — CARVEDILOL 3.125 MG PO TABS
3.1250 mg | ORAL_TABLET | Freq: Two times a day (BID) | ORAL | Status: DC
  Administered 2023-11-28 (×2): 3.125 mg via ORAL
  Filled 2023-11-27 (×2): qty 1

## 2023-11-27 MED ORDER — ASPIRIN 81 MG PO TBEC
81.0000 mg | DELAYED_RELEASE_TABLET | Freq: Every day | ORAL | Status: DC
  Administered 2023-11-28: 81 mg via ORAL
  Filled 2023-11-27: qty 1

## 2023-11-27 MED ORDER — UBROGEPANT 50 MG PO TABS: 50.0000 mg | ORAL_TABLET | ORAL | Status: DC | PRN

## 2023-11-27 MED ORDER — IRBESARTAN 300 MG PO TABS: 300.0000 mg | ORAL_TABLET | Freq: Every day | ORAL | Status: DC

## 2023-11-27 NOTE — ED Provider Notes (Signed)
 Patient to be admitted.  BNP is elevated troponin baseline.  Evaluated by nephrology.  Has missed some dialysis.  Will admit to finish cardiac workup.  Will see if she will need more emergent dialysis/urgent dialysis overnight and tomorrow but at this time we can hold.  Will see if we can optimize some of her medications.  Admitted to medicine.  No PE on CT scan of her chest.  This chart was dictated using voice recognition software.  Despite best efforts to proofread,  errors can occur which can change the documentation meaning.    Lowery Rue, DO 11/27/23 1823

## 2023-11-27 NOTE — H&P (Addendum)
 History and Physical    DOA: 11/27/2023  PCP: Lanney Pitts, DO  Patient coming from: home  Chief Complaint: shortness of breath  HPI: Kathy Frank is a 48 y.o. female with history h/o HTN, HLP, CAD, CVA, DM type II, depression, HIV, ESRD on dialysis TTS presents with complaints of shortness of breath.  Patient apparently missed his Thursday dialysis session for not feeling well and today (Saturday) his dialysis treatment was aborted as could not tolerate with worsening dyspnea.  No complaints of chest pain or leg swellings.  Reports compliance with other medications.  Does have ESRD but still makes some urine.  Patient sleeping comfortably on the stretcher when seen in the ED, she did not want to wake up or answer questions.  She did let me examine and stated yes or no to few questions.  Hence history obtained limited.  Per chart review-on arrival to the ED, BP  elevated with systolic 1 70-1 80s, saturating well on room air.  Chemistry unremarkable with normal sodium and potassium levels, creatinine at baseline.  His BNP however elevated at 2762.  Seen by nephrology who recommended admission to medicine and to rule out superimposed CHF.    Review of Systems: As per HPI, otherwise review of systems negative.    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   Anemia, posthemorrhagic, acute 10/14/2022   Anxiety    CAD (coronary artery disease)    CAP (community acquired pneumonia) 09/29/2022   CVA (cerebral vascular accident) [facility])    Depression 06/28/2006   Qualifier: Diagnosis of  By: Gregery Leander MD, Todd     Diabetes mellitus type 2 in obese 06/28/1994   Dyspnea    uses oxygeb 2 liters per minute at dialysis   End stage renal disease on dialysis [facility])    on hemodialysis T/Th/Sat   Erosive esophagitis    Esophageal reflux    Eye redness    Fecal incontinence 02/04/2018   Gastroparesis    ?  diabetic   GERD (gastroesophageal reflux disease)    Human immunodeficiency virus (HIV) disease (HCC) 04/23/2016   Hyperkalemia 10/12/2022   Hyperlipidemia    Hypertension    Hypertensive emergency 11/09/2021   Hypertensive urgency 05/20/2021   Metabolic bone disease 05/02/2019   Moderate nonproliferative diabetic retinopathy of both eyes (HCC) 11/21/2014   11/14/14: Noted on retinal imaging; needs follow-up imaging in 6 months  05/22/16: Noted on retinal imaging again; needs follow-up imaging in 6 months   PAD (peripheral artery disease) (HCC)    Type 2 diabetes mellitus with diabetic peripheral angiopathy without gangrene (HCC) 05/01/2019   Wound infection s/p L transmetatarsal amputation     Past Surgical History:  Procedure Laterality Date   A/V SHUNTOGRAM Left 04/21/2023   Procedure: A/V SHUNTOGRAM;  Surgeon: Kayla Part, MD;  Location: [facility] INVASIVE CV LAB;  Service: Cardiovascular;  Laterality: Left;   ABDOMINAL AORTOGRAM W/LOWER EXTREMITY N/A 12/13/2020   Procedure: ABDOMINAL AORTOGRAM W/LOWER EXTREMITY;  Surgeon: Dannis Dy, MD;  Location: [facility] INVASIVE CV LAB;  Service: Cardiovascular;  Laterality: N/A;   AMPUTATION Left 07/08/2018   Procedure: AMPUTATION FORTH RAY LEFT FOOT;  Surgeon: Timothy Ford, MD;  Location: [facility] OR;  Service: Orthopedics;  Laterality: Left;   AMPUTATION Left 08/09/2018   Procedure: Left Transmetatarsal Amputation;  Surgeon: Timothy Ford, MD;  Location: [facility] OR;  Service: Orthopedics;  Laterality: Left;   AMPUTATION Left  10/08/2018   Procedure: LEFT BELOW KNEE AMPUTATION;  Surgeon: Timothy Ford, MD;  Location: [facility] OR;  Service: Orthopedics;  Laterality: Left;   AMPUTATION Left 10/28/2018   Procedure: REVISION BELOW KNEE AMPUTATION;  Surgeon: Timothy Ford, MD;  Location: [facility] OR;  Service: Orthopedics;  Laterality: Left;   AMPUTATION Left 12/16/2018   Procedure: LEFT ABOVE KNEE AMPUTATION;  Surgeon: Timothy Ford, MD;  Location: [facility] OR;  Service:  Orthopedics;  Laterality: Left;   AMPUTATION Right 01/31/2021   Procedure: AMPUTATION BELOW KNEE;  Surgeon: Margherita Shell, MD;  Location: [facility] OR;  Service: Vascular;  Laterality: Right;   AMPUTATION Right 03/28/2021   Procedure: REVISION OF RIGHT BELOW KNEE AMPUTATION;  Surgeon: Margherita Shell, MD;  Location: [facility] OR;  Service: Vascular;  Laterality: Right;   AMPUTATION Right 04/18/2021   Procedure: right below knee amputation/washout placement wound vac;  Surgeon: Carlene Che, MD;  Location: [facility] OR;  Service: Vascular;  Laterality: Right;   AMPUTATION Right 02/19/2023   Procedure: CONVERSION TO ABOVE KNEE AMPUTATION;  Surgeon: Adine Hoof, MD;  Location: [facility] OR;  Service: Vascular;  Laterality: Right;   APPLICATION OF WOUND VAC Left 12/31/2022   Procedure: APPLICATION OF PREVENA WOUND VAC;  Surgeon: Kayla Part, MD;  Location: [facility] OR;  Service: Vascular;  Laterality: Left;   AV FISTULA PLACEMENT Left 10/14/2018   Procedure: Arteriovenous (Av) Fistula Creation Left Arm;  Surgeon: Young Hensen, MD;  Location: [facility] OR;  Service: Vascular;  Laterality: Left;   BASCILIC VEIN TRANSPOSITION Left 04/14/2019   Procedure: BASILIC VEIN TRANSPOSITION SECOND STAGE LEFT ARM;  Surgeon: Mayo Speck, MD;  Location: [facility] OR;  Service: Vascular;  Laterality: Left;   BIOPSY  10/14/2022   Procedure: BIOPSY;  Surgeon: Ace Holder, MD;  Location: [facility] ENDOSCOPY;  Service: Gastroenterology;;   COMPLEX WOUND CLOSURE Left 12/31/2022   Procedure: COMPLEX WOUND CLOSURE;  Surgeon: Kayla Part, MD;  Location: [facility] OR;  Service: Vascular;  Laterality: Left;   CORONARY STENT INTERVENTION N/A 05/21/2021   Procedure: CORONARY STENT INTERVENTION;  Surgeon: Arleen Lacer, MD;  Location: [facility] INVASIVE CV LAB;  Service: Cardiovascular;  Laterality: N/A;   ESOPHAGOGASTRODUODENOSCOPY (EGD) WITH PROPOFOL  N/A 10/14/2022   Procedure: ESOPHAGOGASTRODUODENOSCOPY (EGD) WITH PROPOFOL ;  Surgeon: Ace Holder, MD;  Location: [facility] ENDOSCOPY;  Service: Gastroenterology;  Laterality: N/A;   HEMOSTASIS CLIP PLACEMENT  10/14/2022   Procedure: HEMOSTASIS CLIP PLACEMENT;  Surgeon: Ace Holder, MD;  Location: [facility];  Service: Gastroenterology;;   INCISION AND DRAINAGE Left 12/31/2022   Procedure: INCISION AND DRAINAGE OF LEFT ARM;  Surgeon: Kayla Part, MD;  Location: [facility] OR;  Service: Vascular;  Laterality: Left;   INCISION AND DRAINAGE ABSCESS N/A 10/24/2020   Procedure: exicision of hydradenitis;  Surgeon: Joyce Nixon, MD;  Location: [facility];  Service: General;  Laterality: N/A;  45 min   INSERTION OF DIALYSIS CATHETER Right 04/14/2019   Procedure: INSERTION OF DIALYSIS CATHETER;  Surgeon: Mayo Speck, MD;  Location: [facility] OR;  Service: Vascular;  Laterality: Right;   IR FLUORO GUIDE CV LINE RIGHT  10/13/2022   IR REMOVAL TUN CV CATH W/O FL  10/16/2022   IR US  GUIDE VASC ACCESS RIGHT  10/13/2022   LEFT HEART CATH AND CORONARY ANGIOGRAPHY N/A 05/21/2021   Procedure: LEFT HEART CATH AND CORONARY ANGIOGRAPHY;  Surgeon: Arleen Lacer, MD;  Location: [facility] INVASIVE CV LAB;  Service: Cardiovascular;  Laterality: N/A;  LOWER EXTREMITY ANGIOGRAPHY N/A 07/05/2018   Procedure: LOWER EXTREMITY ANGIOGRAPHY;  Surgeon: Margherita Shell, MD;  Location: [facility] INVASIVE CV LAB;  Service: Cardiovascular;  Laterality: N/A;   PERIPHERAL VASCULAR ATHERECTOMY Right 12/13/2020   Procedure: PERIPHERAL VASCULAR ATHERECTOMY;  Surgeon: Dannis Dy, MD;  Location: [facility] INVASIVE CV LAB;  Service: Cardiovascular;  Laterality: Right;  Superficial femoral   PERIPHERAL VASCULAR BALLOON ANGIOPLASTY Left 07/05/2018   Procedure: PERIPHERAL VASCULAR BALLOON ANGIOPLASTY;  Surgeon: Margherita Shell, MD;  Location: [facility] INVASIVE CV LAB;  Service: Cardiovascular;  Laterality: Left;  SFA   PERIPHERAL VASCULAR BALLOON ANGIOPLASTY Right 12/13/2020   Procedure: PERIPHERAL VASCULAR BALLOON ANGIOPLASTY;  Surgeon: Dannis Dy, MD;  Location: [facility] INVASIVE CV LAB;  Service: Cardiovascular;  Laterality: Right;  Peroneal artery, anterior tibial artery   RECTAL EXAM UNDER ANESTHESIA N/A 10/24/2020   Procedure: EXAM UNDER ANESTHESIA;  Surgeon: Joyce Nixon, MD;  [facility];  Service: General;  Laterality: N/A;   STUMP REVISION Left 10/19/2018   Procedure: REVISION LEFT BELOW KNEE AMPUTATION;  Surgeon: Timothy Ford, MD;  Location: [facility] OR;  Service: Orthopedics;  Laterality: Left;   TUBAL LIGATION  2002    Social history:  reports that she quit smoking about 9 years ago. Her smoking use included cigarettes. She started smoking about 10 years ago. She has a 1 pack-year smoking history. She has never used smokeless tobacco. She reports that she does not currently use alcohol . She reports that she does not currently use drugs after having used the following drugs: Marijuana.   No Known Allergies  Family History  Problem Relation Age of Onset   Diabetes Mother    Diabetes Daughter    Diabetes Daughter    Breast cancer Maternal Grandmother 70   Diabetes Maternal Grandmother    Diabetes Brother    Mental retardation Brother        died from PNA      Prior to Admission medications   Medication Sig Start Date End Date Taking? Authorizing Provider  LOKELMA  5 g packet Take 1 packet by mouth daily. 11/22/23  Yes [provider]  rOPINIRole  (REQUIP ) 0.25 MG tablet Take 0.25 mg by mouth at bedtime. 11/17/23  Yes [provider]  Accu-Chek Softclix Lancets lancets Use as instructed 05/02/21   Atway, Rayann N, DO  albuterol  (VENTOLIN  HFA) 108 (90 Base) MCG/ACT inhaler Inhale 2 puffs into the lungs every 4 (four) hours as needed for wheezing or shortness of breath. 04/29/23   Alexander-Savino, [place], MD  amLODipine -olmesartan  (AZOR ) 10-40 MG tablet Take 1 tablet by mouth daily. 11/19/23   Tawkaliyar, Roya, DO  aspirin  EC (EQ ASPIRIN  ADULT LOW DOSE) 81 MG tablet Take 1  tablet (81 mg total) by mouth daily. Swallow whole. 04/29/23   Alexander-Savino, [place], MD  atorvastatin  (LIPITOR ) 80 MG tablet Take 1 tablet (80 mg total) by mouth daily. 02/01/23   Allana Arabia, MD  bictegravir-emtricitabine -tenofovir  AF (BIKTARVY ) 50-200-25 MG TABS tablet Take 1 tablet by mouth daily. Try to take at the same time each day with or without food. 11/22/23   Manandhar, Sabina, MD  calcitRIOL  (ROCALTROL ) 0.25 MCG capsule Take 5 capsules (1.25 mcg total) by mouth Every Tuesday,Thursday,and Saturday with dialysis. 04/29/23   Alexander-Savino, [place], MD  calcium  acetate (PHOSLO ) 667 MG capsule Take 2 capsules (1,334 mg total) by mouth 3 (three) times daily with meals. 04/29/23   Isabell Manzanilla, [place], MD  carvedilol  (COREG ) 25 MG tablet Take by mouth. 09/07/23  [provider]  clopidogrel  (PLAVIX ) 75 MG tablet Take 1 tablet (75 mg total) by mouth daily. 04/29/23   Alexander-Savino, [place], MD  Continuous Blood Gluc Sensor (DEXCOM G7 SENSOR) MISC Use to check blood sugar continuously 06/30/22   Joette Mustard, MD  cyclobenzaprine  (FLEXERIL ) 5 MG tablet Take 1 tablet (5 mg total) by mouth 2 (two) times daily as needed for muscle spasms. 07/26/23   Jonelle Neri, DO  Darbepoetin Alfa  (ARANESP ) 200 MCG/0.4ML SOSY injection Inject 0.4 mLs (200 mcg total) into the skin every Monday at 6 PM. Patient taking differently: Inject 200 mcg into the skin every 7 (seven) days. Every Monday 07/06/22   Joette Mustard, MD  escitalopram  (LEXAPRO ) 5 MG tablet TAKE ONE TABLET BY MOUTH DAILY AT 9AM 11/04/23   Tawkaliyar, Roya, DO  glucose blood test strip Use as instructed 05/02/21   Atway, Rayann N, DO  insulin  glargine (LANTUS  SOLOSTAR) 100 UNIT/ML Solostar Pen Inject 15 Units into the skin at bedtime. 09/22/23   Tawkaliyar, Roya, DO  Insulin  Pen Needle (PENTIPS) 32G X 4 MM MISC Use once daily with insulin  and once daily with victoza  09/30/23   Atway, Rayann N, DO  liraglutide  (VICTOZA ) 18  MG/3ML SOPN Inject 0.6 mg into the skin daily. 03/02/23   Amoako, Prince, MD  multivitamin (RENA-VIT) TABS tablet Take 1 tablet by mouth daily. Patient taking differently: Take 1 tablet by mouth See admin instructions. On Tuesday, Wednesday and Thursday 02/14/21   Angiulli, Everlyn Hockey, PA-C  Nystatin (GERHARDT'S BUTT CREAM) CREA Apply 1 Application topically 2 (two) times daily. Patient not taking: Reported on 05/21/2023 04/29/23   Isabell Manzanilla, [place], MD  pantoprazole  (PROTONIX ) 40 MG tablet Take 1 tablet (40 mg total) by mouth 2 (two) times daily. Patient not taking: Reported on 05/21/2023 12/25/22   McLendon, Michael, MD  psyllium (HYDROCIL/METAMUCIL) 95 % PACK Take 1 packet by mouth daily. 04/29/23   Alexander-Savino, [place], MD  Ubrogepant  (UBRELVY ) 50 MG TABS Take 1 tablet (50 mg total) by mouth as needed (migraine headache). if needed a second dose may be taken at least 2 hours after the initial dose; MAX 8 doses in a month 08/31/23   Arellano Zameza, Priscila, MD    Physical Exam: Vitals:   11/27/23 1215 11/27/23 1230 11/27/23 1400 11/27/23 1800  BP: (!) 180/85 (!) 190/84 (!) 175/74 (!) 192/100  Pulse: 92  89   Resp:   14 11  Temp: 98.6 F (37 C)     TempSrc: Oral     SpO2: 95%  100%     Constitutional: NAD, calm, comfortable-sleeping comfortably, no respiratory distress Eyes: PERRL, lids and conjunctivae normal ENMT: Mucous membranes are moist. Posterior pharynx clear of any exudate or lesions.Normal dentition.  Neck: normal, supple, no masses, no thyromegaly Respiratory: clear to auscultation bilaterally, no wheezing, no crackles. Normal respiratory effort. No accessory muscle use.  Cardiovascular: Regular rate and rhythm, no murmurs / rubs / gallops Abdomen: no tenderness, no masses palpated. No hepatosplenomegaly. Bowel sounds positive.  Musculoskeletal: . Normal muscle tone.  S/p bilateral BKA.  No stump wounds or active infection Neurologic: Moving all extremities, no  focal deficits.  Limited exam as patient sleeping.   SKIN/catheters: Surgical scars noted along BKA.  AV fistula along left upper extremity.  No rashes, lesions, ulcers. No induration  Labs on Admission: I have personally reviewed following labs and imaging studies  CBC: Recent Labs  Lab 11/27/23 0952  WBC 6.4  NEUTROABS 4.6  HGB 11.9*  HCT 37.9  MCV 93.6  PLT 313   Basic Metabolic Panel: Recent Labs  Lab 11/27/23 0952  NA 137  K 3.9  CL 95*  CO2 22  GLUCOSE 131*  BUN 28*  CREATININE 6.99*  CALCIUM  9.8   GFR: CrCl cannot be calculated (Unknown ideal weight.). Recent Labs  Lab 11/27/23 0952  WBC 6.4   Liver Function Tests: Recent Labs  Lab 11/27/23 0952  AST 12*  ALT 7  ALKPHOS 52  BILITOT 0.6  PROT 9.0*  ALBUMIN 3.5   No results for input(s): "LIPASE", "AMYLASE" in the last 168 hours. No results for input(s): "AMMONIA" in the last 168 hours. Coagulation Profile: No results for input(s): "INR", "PROTIME" in the last 168 hours. Cardiac Enzymes: No results for input(s): "CKTOTAL", "CKMB", "CKMBINDEX", "TROPONINI" in the last 168 hours. BNP (last 3 results) No results for input(s): "PROBNP" in the last 8760 hours. HbA1C: No results for input(s): "HGBA1C" in the last 72 hours. CBG: No results for input(s): "GLUCAP" in the last 168 hours. Lipid Profile: No results for input(s): "CHOL", "HDL", "LDLCALC", "TRIG", "CHOLHDL", "LDLDIRECT" in the last 72 hours. Thyroid  Function Tests: No results for input(s): "TSH", "T4TOTAL", "FREET4", "T3FREE", "THYROIDAB" in the last 72 hours. Anemia Panel: No results for input(s): "VITAMINB12", "FOLATE", "FERRITIN", "TIBC", "IRON ", "RETICCTPCT" in the last 72 hours. Urine analysis:    Component Value Date/Time   COLORURINE YELLOW 05/02/2019 1300   APPEARANCEUR HAZY (A) 05/02/2019 1300   APPEARANCEUR Cloudy (A) 04/23/2016 1530   LABSPEC 1.016 05/02/2019 1300   PHURINE 5.0 05/02/2019 1300   GLUCOSEU NEGATIVE 05/02/2019  1300   HGBUR SMALL (A) 05/02/2019 1300   BILIRUBINUR NEGATIVE 05/02/2019 1300   BILIRUBINUR Negative 04/23/2016 1530   KETONESUR NEGATIVE 05/02/2019 1300   PROTEINUR >=300 (A) 05/02/2019 1300   UROBILINOGEN 0.2 05/30/2015 1316   NITRITE NEGATIVE 05/02/2019 1300   LEUKOCYTESUR NEGATIVE 05/02/2019 1300    Radiological Exams on Admission: Personally reviewed  CT Angio Chest PE W and/or Wo Contrast Result Date: 11/27/2023 CLINICAL DATA:  Shortness of breath, headache EXAM: CT ANGIOGRAPHY CHEST WITH CONTRAST TECHNIQUE: Multidetector CT imaging of the chest was performed using the standard protocol during bolus administration of intravenous contrast. Multiplanar CT image reconstructions and MIPs were obtained to evaluate the vascular anatomy. RADIATION DOSE REDUCTION: This exam was performed according to the departmental dose-optimization program which includes automated exposure control, adjustment of the mA and/or kV according to patient size and/or use of iterative reconstruction technique. CONTRAST:  75mL OMNIPAQUE  IOHEXOL  350 MG/ML SOLN COMPARISON:  10/01/2022 FINDINGS: Cardiovascular: Heart size normal. No pericardial effusion. The RV is nondilated. Satisfactory opacification of pulmonary arteries noted, and there is no evidence of pulmonary emboli. Mitral annulus calcifications. Extensive coronary and moderate aortic calcifications. No aneurysm. Mediastinum/Nodes: No hematoma, mass, or adenopathy. Subcentimeter left axillary lymph nodes. Lungs/Pleura: No pleural effusion. No pneumothorax. Mild dependent atelectasis posteriorly in the lung bases. 4 mm subpleural ground-glass nodule in the anterior right upper lobe (Im53,Se8) , stable since previous. Chronic scarring in the medial basal segment right lower lobe. Upper Abdomen: Extensive visceral and renal artery calcifications. No acute findings. Musculoskeletal: No chest wall abnormality. No acute or significant osseous findings. Review of the MIP  images confirms the above findings. IMPRESSION: 1. Negative for acute PE or thoracic aortic dissection. 2. Coronary and aortic Atherosclerosis (ICD10-I70.0). Electronically Signed   By: Nicoletta Barrier M.D.   On: 11/27/2023 16:48   CT Head Wo Contrast Result Date: 11/27/2023 CLINICAL DATA:  New onset headache. Assess  for intracranial hemorrhage prior to anticoagulation. EXAM: CT HEAD WITHOUT CONTRAST TECHNIQUE: Contiguous axial images were obtained from the base of the skull through the vertex without intravenous contrast. RADIATION DOSE REDUCTION: This exam was performed according to the departmental dose-optimization program which includes automated exposure control, adjustment of the mA and/or kV according to patient size and/or use of iterative reconstruction technique. COMPARISON:  04/26/2023 FINDINGS: Brain: No focal abnormality seen affecting the brainstem or cerebellum by CT. 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 No evidence of acute infarction, mass lesion, hemorrhage, hydrocephalus or extra-axial collection. A linear density previously seen in the left parietal region is present in September of 2024, presumably vascular, and does not represent an acute hemorrhage. Vascular: There is atherosclerotic calcification of the major vessels at the base of the brain. Skull: Negative Sinuses/Orbits: Mild seasonal mucosal inflammatory changes. No advanced sinusitis. Orbits negative. Other: None IMPRESSION: No acute CT finding. 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 No intracranial hemorrhage or contraindication to anticoagulation is visible. Electronically Signed   By: Bettylou Brunner M.D.   On: 11/27/2023 13:38   DG Chest Portable 1 View Result Date: 11/27/2023 CLINICAL DATA:   Shortness of breath EXAM: PORTABLE CHEST 1 VIEW COMPARISON:  04/20/2023 FINDINGS: Mild cardiomegaly. Coronary artery calcification and/or stents. Aortic atherosclerotic calcification. The lungs are clear. The vascularity is normal. No infiltrate, collapse or effusion. Previously seen right internal jugular catheter is no longer present. IMPRESSION: No active disease. Mild cardiomegaly. Coronary artery calcification and/or stents. Aortic atherosclerotic calcification. Electronically Signed   By: Bettylou Brunner M.D.   On: 11/27/2023 10:19    EKG: Independently reviewed.  Normal sinus rhythm, repolarization abnormality in lateral leads, IVCD, QTc 498 ms     Assessment and Plan:   Principal Problem:   Acute diastolic CHF (congestive heart failure) (HCC)    1.Shortness of breath: Secondary to missed dialysis versus acute diastolic CHF in the setting of uncontrolled hypertension.  Could not appreciate leg edema as patient s/p bilateral BKA.  No significant crepitus on exam.  CTA chest negative for PE in the ED.  No evidence of pleural effusion or pulmonary edema on chest x-ray/CT chest.  Elevated BNP could be secondary to renal failure.  Will obtain echo to rule out cardiomyopathy.  Appreciate nephrology evaluation and follow-up.  2.  Malignant hypertension: In the setting of inadequate dialysis.  Currently bedside monitor showing blood pressure 158/90.  Resume home medications (Coreg , amlodipine /olmesartan ) and will have IV hydralazine  as needed available.  3.  HIV: Resume Biktarvy .  4.  Diabetes mellitus type 2, insulin -dependent: Resume Victoza  at home dose and Lantus  at a lower dose while in the hospital.   Will have sliding scale insulin  available.  Diabetic diet ordered.  5.  ESRD: Resume HD TTS as discussed above.  Resume other home medications.    6. Hyperlipidemia, CAD, CVA, depression: Resume home medications  7.  Prolonged QTc: Patient's QTc elevated at 498 ms.  Avoid QT prolonging  agents.  Continue beta-blockers.  Potassium 3.9, check magnesium  level  DVT prophylaxis: Heparin   COVID screen:  Code Status: Full code    Consults called: Nephrology Admission status :Patient will be admitted under OBSERVATION status.The patient's presenting symptoms, physical exam findings, and initial radiographic and laboratory data in the context of their medical  condition is felt to place them at low risk for further clinical deterioration. Furthermore, it is anticipated that the patient will be medically stable for discharge from the hospital within 2 midnights of hospital stay.       Michela Aguas MD [facility] Pager in [place]  If 7PM-7AM, please contact night-coverage [email or web address]   11/27/2023, 8:13 PM

## 2023-11-27 NOTE — ED Notes (Signed)
 Patient transported to CT

## 2023-11-27 NOTE — ED Triage Notes (Signed)
 Pt arrives by [facility] from HD.  Pt gets HD on TThS and had her last full treatment Tuesday, she missed Thursday due to lack of transportation and today she had 2hours and of her 4 hour treatment.  They interrrupted her HD treatment due to sob.  Pt has had sob for about a week but the position she was in during HD made the sob worse.  VSS for EMS, fortunately pt appears in no distress on arrival.

## 2023-11-27 NOTE — ED Provider Notes (Signed)
 [place] [facility]MENT AT [facility] Provider Note   CSN: [id number] Arrival date & time: 11/27/23  4098     History  Chief Complaint  Patient presents with   Shortness of Breath    Kathy Frank is a 48 y.o. female.  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   She had a full treatment on Tuesday, missed Thursday due to lack of transportation and today she had 2 hours and 11 minutes of her 4-hour treatment prior to them interrupting her dialysis due to shortness of breath.  Difficult time breathing for the last week, going to drop dry weight.  Normally wears O2 at dialysis but today not helping.  Worse laying down flat. Today at dialysis laid back and began having more dyspnea. No chest pain. Back pain for a couple of days, right flank.   Not worse with deep breath. Feels better with deep breaths.  Appetite worse.  No cough. No fever. No nausea, vomiting, diarrhea.  No leg pain or swelling.  No abdominal pain.   Missed dialysis on Thursday. Has been feeling dyspnea since before then. Discussed with dr on Tuesday.   Still making a little urine, 3-4 years on dialysis  No smoking, etoh, drugs other than mj No hx of asthma-does have inhaler, has not used it 313, undetectable quant  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   Anemia, posthemorrhagic, acute 10/14/2022   Anxiety    CAD (coronary artery disease)    CAP (community acquired pneumonia) 09/29/2022   CVA (cerebral vascular accident) [facility])    Depression 06/28/2006   Qualifier: Diagnosis of  By: Gregery Leander MD, Todd     Diabetes mellitus type 2 in  obese 06/28/1994   Dyspnea    uses oxygeb 2 liters per minute at dialysis   End stage renal disease on dialysis [facility])    on hemodialysis T/Th/Sat   Erosive esophagitis    Esophageal reflux    Eye redness    Fecal incontinence 02/04/2018   Gastroparesis    ? diabetic   GERD (gastroesophageal reflux disease)    Human immunodeficiency virus (HIV) disease (HCC) 04/23/2016   Hyperkalemia 10/12/2022   Hyperlipidemia    Hypertension    Hypertensive emergency 11/09/2021   Hypertensive urgency 05/20/2021   Metabolic bone disease 05/02/2019   Moderate nonproliferative diabetic retinopathy of both eyes (HCC) 11/21/2014   11/14/14: Noted on retinal imaging; needs follow-up imaging in 6 months  05/22/16: Noted on retinal imaging again; needs follow-up imaging in 6 months   PAD (peripheral artery disease) (HCC)    Type 2 diabetes mellitus with diabetic peripheral angiopathy without gangrene (HCC) 05/01/2019   Wound infection s/p L transmetatarsal amputation      Home Medications Prior to Admission medications   Medication Sig Start Date End Date Taking? Authorizing Provider  LOKELMA  5 g packet Take 1 packet by mouth daily. 11/22/23  Yes [provider]  rOPINIRole  (REQUIP ) 0.25 MG tablet Take 0.25 mg by mouth at bedtime. 11/17/23   [provider]  Accu-Chek Softclix Lancets lancets Use as instructed 05/02/21   Atway, Rayann N, DO  albuterol  (VENTOLIN  HFA) 108 (90 Base) MCG/ACT inhaler Inhale  2 puffs into the lungs every 4 (four) hours as needed for wheezing or shortness of breath. 04/29/23   Alexander-Savino, Washington, MD  amLODipine -olmesartan  (AZOR ) 10-40 MG tablet Take 1 tablet by mouth daily. 11/19/23   Tawkaliyar, Roya, DO  aspirin  EC (EQ ASPIRIN  ADULT LOW DOSE) 81 MG tablet Take 1 tablet (81 mg total) by mouth daily. Swallow whole. 04/29/23   Alexander-Savino, [place], MD  atorvastatin  (LIPITOR ) 80 MG tablet Take 1 tablet (80 mg total) by mouth daily. 02/01/23   Allana Arabia, MD   bictegravir-emtricitabine -tenofovir  AF (BIKTARVY ) 50-200-25 MG TABS tablet Take 1 tablet by mouth daily. Try to take at the same time each day with or without food. 11/22/23   Manandhar, Sabina, MD  calcitRIOL  (ROCALTROL ) 0.25 MCG capsule Take 5 capsules (1.25 mcg total) by mouth Every Tuesday,Thursday,and Saturday with dialysis. 04/29/23   Alexander-Savino, [place], MD  calcium  acetate (PHOSLO ) 667 MG capsule Take 2 capsules (1,334 mg total) by mouth 3 (three) times daily with meals. 04/29/23   Alexander-Savino, Washington, MD  carvedilol  (COREG ) 25 MG tablet Take by mouth. 09/07/23   [provider]  clopidogrel  (PLAVIX ) 75 MG tablet Take 1 tablet (75 mg total) by mouth daily. 04/29/23   Alexander-Savino, [place], MD  Continuous Blood Gluc Sensor (DEXCOM G7 SENSOR) MISC Use to check blood sugar continuously 06/30/22   Joette Mustard, MD  cyclobenzaprine  (FLEXERIL ) 5 MG tablet Take 1 tablet (5 mg total) by mouth 2 (two) times daily as needed for muscle spasms. 07/26/23   Jonelle Neri, DO  Darbepoetin Alfa  (ARANESP ) 200 MCG/0.4ML SOSY injection Inject 0.4 mLs (200 mcg total) into the skin every Monday at 6 PM. Patient taking differently: Inject 200 mcg into the skin every 7 (seven) days. Every Monday 07/06/22   Joette Mustard, MD  escitalopram  (LEXAPRO ) 5 MG tablet TAKE ONE TABLET BY MOUTH DAILY AT 9AM 11/04/23   Tawkaliyar, Roya, DO  glucose blood test strip Use as instructed 05/02/21   Atway, Rayann N, DO  insulin  glargine (LANTUS  SOLOSTAR) 100 UNIT/ML Solostar Pen Inject 15 Units into the skin at bedtime. 09/22/23   Tawkaliyar, Roya, DO  Insulin  Pen Needle (PENTIPS) 32G X 4 MM MISC Use once daily with insulin  and once daily with victoza  09/30/23   Atway, Rayann N, DO  liraglutide  (VICTOZA ) 18 MG/3ML SOPN Inject 0.6 mg into the skin daily. 03/02/23   Amoako, Prince, MD  multivitamin (RENA-VIT) TABS tablet Take 1 tablet by mouth daily. Patient taking differently: Take 1 tablet by mouth See  admin instructions. On Tuesday, Wednesday and Thursday 02/14/21   Angiulli, Everlyn Hockey, PA-C  Nystatin (GERHARDT'S BUTT CREAM) CREA Apply 1 Application topically 2 (two) times daily. Patient not taking: Reported on 05/21/2023 04/29/23   Isabell Manzanilla, [place], MD  pantoprazole  (PROTONIX ) 40 MG tablet Take 1 tablet (40 mg total) by mouth 2 (two) times daily. Patient not taking: Reported on 05/21/2023 12/25/22   McLendon, Michael, MD  psyllium (HYDROCIL/METAMUCIL) 95 % PACK Take 1 packet by mouth daily. 04/29/23   Alexander-Savino, Washington, MD  Ubrogepant  (UBRELVY ) 50 MG TABS Take 1 tablet (50 mg total) by mouth as needed (migraine headache). if needed a second dose may be taken at least 2 hours after the initial dose; MAX 8 doses in a month 08/31/23   Arellano Zameza, Priscila, MD      Allergies    Patient has no known allergies.    Review of Systems   Review of Systems  Physical Exam Updated Vital Signs BP (!) 148/70  Pulse 89   Temp 98.6 F (37 C) (Oral)   Resp (!) 22   SpO2 100%  Physical Exam Vitals and nursing note reviewed.  Constitutional:      General: She is not in acute distress.    Appearance: She is well-developed. She is not diaphoretic.  HENT:     Head: Normocephalic and atraumatic.  Eyes:     Conjunctiva/sclera: Conjunctivae normal.  Cardiovascular:     Rate and Rhythm: Normal rate and regular rhythm.     Heart sounds: Normal heart sounds. No murmur heard.    No friction rub. No gallop.  Pulmonary:     Effort: Pulmonary effort is normal. No respiratory distress.     Breath sounds: Normal breath sounds. No wheezing or rales.  Abdominal:     General: There is no distension.     Palpations: Abdomen is soft.     Tenderness: There is no abdominal tenderness. There is no guarding.  Musculoskeletal:        General: No tenderness.     Cervical back: Normal range of motion.  Skin:    General: Skin is warm and dry.     Findings: No erythema or rash.  Neurological:      Mental Status: She is alert and oriented to person, place, and time.     ED Results / Procedures / Treatments   Labs (all labs ordered are listed, but only abnormal results are displayed) Labs Reviewed  CBC WITH DIFFERENTIAL/PLATELET - Abnormal; Notable for the following components:      Result Value   Hemoglobin 11.9 (*)    All other components within normal limits  COMPREHENSIVE METABOLIC PANEL WITH GFR - Abnormal; Notable for the following components:   Chloride 95 (*)    Glucose, Bld 131 (*)    BUN 28 (*)    Creatinine, Ser 6.99 (*)    Total Protein 9.0 (*)    AST 12 (*)    GFR, Estimated 7 (*)    Anion gap 20 (*)    All other components within normal limits  BRAIN NATRIURETIC PEPTIDE - Abnormal; Notable for the following components:   B Natriuretic Peptide 2,762.6 (*)    All other components within normal limits  TROPONIN I (HIGH SENSITIVITY) - Abnormal; Notable for the following components:   Troponin I (High Sensitivity) 66 (*)    All other components within normal limits  TROPONIN I (HIGH SENSITIVITY) - Abnormal; Notable for the following components:   Troponin I (High Sensitivity) 65 (*)    All other components within normal limits  HCG, SERUM, QUALITATIVE  CBC  CREATININE, SERUM  BASIC METABOLIC PANEL WITH GFR  MAGNESIUM   HEMOGLOBIN A1C    EKG EKG Interpretation Date/Time:  Saturday November 27 2023 09:51:00 EDT Ventricular Rate:  91 PR Interval:  157 QRS Duration:  128 QT Interval:  404 QTC Calculation: 498 R Axis:   84  Text Interpretation: Sinus rhythm Probable left atrial enlargement Nonspecific intraventricular conduction delay Nonspecific repol abnormality, lateral leads No significant change since last tracing Confirmed by Scarlette Currier ([id number]) on 11/27/2023 10:16:02 AM  Radiology CT Angio Chest PE W and/or Wo Contrast Result Date: 11/27/2023 CLINICAL DATA:  Shortness of breath, headache EXAM: CT ANGIOGRAPHY CHEST WITH CONTRAST TECHNIQUE:  Multidetector CT imaging of the chest was performed using the standard protocol during bolus administration of intravenous contrast. Multiplanar CT image reconstructions and MIPs were obtained to evaluate the vascular anatomy. RADIATION DOSE REDUCTION: This exam was performed  according to the departmental dose-optimization program which includes automated exposure control, adjustment of the mA and/or kV according to patient size and/or use of iterative reconstruction technique. CONTRAST:  75mL OMNIPAQUE  IOHEXOL  350 MG/ML SOLN COMPARISON:  10/01/2022 FINDINGS: Cardiovascular: Heart size normal. No pericardial effusion. The RV is nondilated. Satisfactory opacification of pulmonary arteries noted, and there is no evidence of pulmonary emboli. Mitral annulus calcifications. Extensive coronary and moderate aortic calcifications. No aneurysm. Mediastinum/Nodes: No hematoma, mass, or adenopathy. Subcentimeter left axillary lymph nodes. Lungs/Pleura: No pleural effusion. No pneumothorax. Mild dependent atelectasis posteriorly in the lung bases. 4 mm subpleural ground-glass nodule in the anterior right upper lobe (Im53,Se8) , stable since previous. Chronic scarring in the medial basal segment right lower lobe. Upper Abdomen: Extensive visceral and renal artery calcifications. No acute findings. Musculoskeletal: No chest wall abnormality. No acute or significant osseous findings. Review of the MIP images confirms the above findings. IMPRESSION: 1. Negative for acute PE or thoracic aortic dissection. 2. Coronary and aortic Atherosclerosis (ICD10-I70.0). Electronically Signed   By: Nicoletta Barrier M.D.   On: 11/27/2023 16:48   CT Head Wo Contrast Result Date: 11/27/2023 CLINICAL DATA:  New onset headache. Assess for intracranial hemorrhage prior to anticoagulation. EXAM: CT HEAD WITHOUT CONTRAST TECHNIQUE: Contiguous axial images were obtained from the base of the skull through the vertex without intravenous contrast. RADIATION  DOSE REDUCTION: This exam was performed according to the departmental dose-optimization program which includes automated exposure control, adjustment of the mA and/or kV according to patient size and/or use of iterative reconstruction technique. COMPARISON:  04/26/2023 FINDINGS: Brain: No focal abnormality seen affecting the brainstem or cerebellum by CT. 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 No evidence of acute infarction, mass lesion, hemorrhage, hydrocephalus or extra-axial collection. A linear density previously seen in the left parietal region is present in September of 2024, presumably vascular, and does not represent an acute hemorrhage. Vascular: There is atherosclerotic calcification of the major vessels at the base of the brain. Skull: Negative Sinuses/Orbits: Mild seasonal mucosal inflammatory changes. No advanced sinusitis. Orbits negative. Other: None IMPRESSION: No acute CT finding. 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 No intracranial hemorrhage or contraindication to anticoagulation is visible. Electronically Signed   By: Bettylou Brunner M.D.   On: 11/27/2023 13:38   DG Chest Portable 1 View Result Date: 11/27/2023 CLINICAL DATA:  Shortness of breath EXAM: PORTABLE CHEST 1 VIEW COMPARISON:  04/20/2023 FINDINGS: Mild cardiomegaly. Coronary artery calcification and/or stents. Aortic atherosclerotic calcification. The lungs are clear. The vascularity is normal. No infiltrate, collapse or effusion. Previously seen right internal jugular catheter is no longer present. IMPRESSION: No active disease. Mild cardiomegaly. Coronary artery calcification and/or stents. Aortic atherosclerotic calcification. Electronically Signed   By: Bettylou Brunner M.D.   On: 11/27/2023  10:19    Procedures Procedures    Medications Ordered in ED Medications  aspirin  EC tablet 81 mg (has no administration in time range)  Ubrogepant  TABS 50 mg (has no administration in time range)  bictegravir-emtricitabine -tenofovir  AF (BIKTARVY ) 50-200-25 MG per tablet 1 tablet (has no administration in time range)  amLODipine -olmesartan  (AZOR ) 10-40 MG per tablet 1 tablet (has no administration in time range)  atorvastatin  (LIPITOR ) tablet 80 mg (has no administration in time range)  carvedilol  (COREG ) tablet 3.125 mg (has no administration  in time range)  escitalopram  (LEXAPRO ) tablet 5 mg (has no administration in time range)  calcitRIOL  (ROCALTROL ) capsule 1.25 mcg (has no administration in time range)  calcium  acetate (PHOSLO ) capsule 1,334 mg (has no administration in time range)  pantoprazole  (PROTONIX ) EC tablet 40 mg (has no administration in time range)  psyllium (HYDROCIL/METAMUCIL) 1 packet (has no administration in time range)  clopidogrel  (PLAVIX ) tablet 75 mg (has no administration in time range)  sodium zirconium cyclosilicate  (LOKELMA ) packet 5 g (has no administration in time range)  cyclobenzaprine  (FLEXERIL ) tablet 5 mg (has no administration in time range)  rOPINIRole  (REQUIP ) tablet 0.25 mg (has no administration in time range)  multivitamin (RENA-VIT) tablet 1 tablet (has no administration in time range)  heparin  injection 5,000 Units (has no administration in time range)  acetaminophen  (TYLENOL ) tablet 650 mg (has no administration in time range)    Or  acetaminophen  (TYLENOL ) suppository 650 mg (has no administration in time range)  albuterol  (PROVENTIL ) (2.5 MG/3ML) 0.083% nebulizer solution 2.5 mg (has no administration in time range)  hydrALAZINE  (APRESOLINE ) injection 10 mg (has no administration in time range)  insulin  glargine-yfgn (SEMGLEE ) injection 8 Units (has no administration in time range)  insulin  aspart (novoLOG ) injection 0-6 Units (has no  administration in time range)  prochlorperazine  (COMPAZINE ) injection 10 mg (10 mg Intravenous Given 11/27/23 1212)  diphenhydrAMINE  (BENADRYL ) injection 25 mg (25 mg Intravenous Given 11/27/23 1210)  iohexol  (OMNIPAQUE ) 350 MG/ML injection 75 mL (75 mLs Intravenous Contrast Given 11/27/23 1314)  HYDROcodone -acetaminophen  (NORCO/VICODIN) 5-325 MG per tablet 1 tablet (1 tablet Oral Given 11/27/23 1558)  butalbital -acetaminophen -caffeine  (FIORICET ) 50-325-40 MG per tablet 1 tablet (1 tablet Oral Given 11/27/23 1729)    ED Course/ Medical Decision Making/ A&P                                   48 year old female with history of coronary artery disease, CVA, HIV, type 2 diabetes, hypertension, diabetic retinopathy, bilateral AKA, use of oxygen at dialysis, ESRD on dialysis Tuesday Thursday Saturday, who presents with concern for shortness of breath.  Differential diagnosis for dyspnea includes ACS, PE, COPD exacerbation, CHF exacerbation, anemia, pneumonia, viral etiology such as COVID 19 infection, metabolic abnormality.    Chest x-ray was done which showed no acute findings.   EKG was evaluated by me which showed normal sinus rhythm.    Labs completed and personally evaluate interpreted by me shows stable anemia with a hemoglobin of 11.9, no leukocytosis, no clinically significant electrolyte abnormalities with renal function and anion gap consistent with her being under dialyzed today.  Troponin is 66, increased from prior values of 30s to 40s.  BNP is elevated to 2762 from prior of 293 in March 2024.  Pregnancy test was negative.  CTA PE study ordered and pending. CT head ordered given presence of headache. Plan for likely admission given worsening troponin, BNP compared to prior. Notified Dr. Zana Hesselbach Nephrology.        Final Clinical Impression(s) / ED Diagnoses Final diagnoses:  Shortness of breath  Troponin level elevated  Elevated brain natriuretic peptide (BNP) level    Rx / DC  Orders ED Discharge Orders     None         Scarlette Currier, MD 11/27/23 2157

## 2023-11-27 NOTE — Consult Note (Signed)
 Renal Service Consult Note [facility]  Kathy Frank 11/27/2023 Kathy Sandifer, MD Requesting Physician: Dr. Colonel Dears  Reason for Consult: ESRD pt w/ SOB HPI: The patient is a 48 y.o. year-old w/ PMH as below who presented to ED reporting shortness of breath.  The shortness of breath interrupted her dialysis treatment today after about 2 hours and 10 minutes.  Patient states she has had shortness of breath for about a week but the position that she was in during dialysis made it worse today.  Vital signs are stable for EMS and on arrival patient was in no distress.  In the ED blood pressure 175/74, heart rate 90 respiratory rate 19, temp 98.6, O2 sat 95 to 100% on room air.  Potassium 3.9 BUN 28 creatinine 6.9 albumin 3.5.  We are asked to see for ESRD.   Pt seen in ED room. Pt is no distress, no ^wob. No c/o at this time. Good HD compliance.   ROS - denies CP, no joint pain, no HA, no blurry vision, no rash, no diarrhea, no nausea/ vomiting  PMH: Anemia Anxiety CAD History of CVA Depression Type 2 diabetes ESRD on hemodialysis Gastroparesis HIV HL HTN PAD status post bilateral AKA  Past Surgical History  Past Surgical History:  Procedure Laterality Date   A/V SHUNTOGRAM Left 04/21/2023   Procedure: A/V SHUNTOGRAM;  Surgeon: Kayla Part, MD;  Location: [facility] INVASIVE CV LAB;  Service: Cardiovascular;  Laterality: Left;   ABDOMINAL AORTOGRAM W/LOWER EXTREMITY N/A 12/13/2020   Procedure: ABDOMINAL AORTOGRAM W/LOWER EXTREMITY;  Surgeon: Dannis Dy, MD;  Location: [facility] INVASIVE CV LAB;  Service: Cardiovascular;  Laterality: N/A;   AMPUTATION Left 07/08/2018   Procedure: AMPUTATION FORTH RAY LEFT FOOT;  Surgeon: Timothy Ford, MD;  Location: [facility] OR;  Service: Orthopedics;  Laterality: Left;   AMPUTATION Left 08/09/2018   Procedure: Left Transmetatarsal Amputation;  Surgeon: Timothy Ford, MD;  Location: [facility] OR;  Service: Orthopedics;  Laterality: Left;    AMPUTATION Left 10/08/2018   Procedure: LEFT BELOW KNEE AMPUTATION;  Surgeon: Timothy Ford, MD;  Location: [facility] OR;  Service: Orthopedics;  Laterality: Left;   AMPUTATION Left 10/28/2018   Procedure: REVISION BELOW KNEE AMPUTATION;  Surgeon: Timothy Ford, MD;  Location: [facility] OR;  Service: Orthopedics;  Laterality: Left;   AMPUTATION Left 12/16/2018   Procedure: LEFT ABOVE KNEE AMPUTATION;  Surgeon: Timothy Ford, MD;  Location: [facility] OR;  Service: Orthopedics;  Laterality: Left;   AMPUTATION Right 01/31/2021   Procedure: AMPUTATION BELOW KNEE;  Surgeon: Margherita Shell, MD;  Location: [facility] OR;  Service: Vascular;  Laterality: Right;   AMPUTATION Right 03/28/2021   Procedure: REVISION OF RIGHT BELOW KNEE AMPUTATION;  Surgeon: Margherita Shell, MD;  Location: [facility] OR;  Service: Vascular;  Laterality: Right;   AMPUTATION Right 04/18/2021   Procedure: right below knee amputation/washout placement wound vac;  Surgeon: Carlene Che, MD;  Location: [facility] OR;  Service: Vascular;  Laterality: Right;   AMPUTATION Right 02/19/2023   Procedure: CONVERSION TO ABOVE KNEE AMPUTATION;  Surgeon: Adine Hoof, MD;  Location: [facility] OR;  Service: Vascular;  Laterality: Right;   APPLICATION OF WOUND VAC Left 12/31/2022   Procedure: APPLICATION OF PREVENA WOUND VAC;  Surgeon: Kayla Part, MD;  Location: [facility] OR;  Service: Vascular;  Laterality: Left;   AV FISTULA PLACEMENT Left 10/14/2018   Procedure: Arteriovenous (Av) Fistula Creation Left Arm;  Surgeon: Young Hensen, MD;  Location:  [facility] OR;  Service: Vascular;  Laterality: Left;   BASCILIC VEIN TRANSPOSITION Left 04/14/2019   Procedure: BASILIC VEIN TRANSPOSITION SECOND STAGE LEFT ARM;  Surgeon: Mayo Speck, MD;  Location: [facility] OR;  Service: Vascular;  Laterality: Left;   BIOPSY  10/14/2022   Procedure: BIOPSY;  Surgeon: Ace Holder, MD;  Location: [facility] ENDOSCOPY;  Service: Gastroenterology;;   COMPLEX WOUND CLOSURE Left 12/31/2022   Procedure: COMPLEX  WOUND CLOSURE;  Surgeon: Kayla Part, MD;  Location: [facility] OR;  Service: Vascular;  Laterality: Left;   CORONARY STENT INTERVENTION N/A 05/21/2021   Procedure: CORONARY STENT INTERVENTION;  Surgeon: Arleen Lacer, MD;  Location: [facility] INVASIVE CV LAB;  Service: Cardiovascular;  Laterality: N/A;   ESOPHAGOGASTRODUODENOSCOPY (EGD) WITH PROPOFOL  N/A 10/14/2022   Procedure: ESOPHAGOGASTRODUODENOSCOPY (EGD) WITH PROPOFOL ;  Surgeon: Ace Holder, MD;  Location: [facility] ENDOSCOPY;  Service: Gastroenterology;  Laterality: N/A;   HEMOSTASIS CLIP PLACEMENT  10/14/2022   Procedure: HEMOSTASIS CLIP PLACEMENT;  Surgeon: Ace Holder, MD;  Location: [facility] ENDOSCOPY;  Service: Gastroenterology;;   INCISION AND DRAINAGE Left 12/31/2022   Procedure: INCISION AND DRAINAGE OF LEFT ARM;  Surgeon: Kayla Part, MD;  Location: [facility] OR;  Service: Vascular;  Laterality: Left;   INCISION AND DRAINAGE ABSCESS N/A 10/24/2020   Procedure: exicision of hydradenitis;  Surgeon: Joyce Nixon, MD;  Location: [facility];  Service: General;  Laterality: N/A;  45 min   INSERTION OF DIALYSIS CATHETER Right 04/14/2019   Procedure: INSERTION OF DIALYSIS CATHETER;  Surgeon: Mayo Speck, MD;  Location: [facility] OR;  Service: Vascular;  Laterality: Right;   IR FLUORO GUIDE CV LINE RIGHT  10/13/2022   IR REMOVAL TUN CV CATH W/O FL  10/16/2022   IR US  GUIDE VASC ACCESS RIGHT  10/13/2022   LEFT HEART CATH AND CORONARY ANGIOGRAPHY N/A 05/21/2021   Procedure: LEFT HEART CATH AND CORONARY ANGIOGRAPHY;  Surgeon: Arleen Lacer, MD;  Location: [facility] INVASIVE CV LAB;  Service: Cardiovascular;  Laterality: N/A;   LOWER EXTREMITY ANGIOGRAPHY N/A 07/05/2018   Procedure: LOWER EXTREMITY ANGIOGRAPHY;  Surgeon: Margherita Shell, MD;  Location: [facility] INVASIVE CV LAB;  Service: Cardiovascular;  Laterality: N/A;   PERIPHERAL VASCULAR ATHERECTOMY Right 12/13/2020   Procedure: PERIPHERAL VASCULAR ATHERECTOMY;  Surgeon: Dannis Dy, MD;   Location: [facility] INVASIVE CV LAB;  Service: Cardiovascular;  Laterality: Right;  Superficial femoral   PERIPHERAL VASCULAR BALLOON ANGIOPLASTY Left 07/05/2018   Procedure: PERIPHERAL VASCULAR BALLOON ANGIOPLASTY;  Surgeon: Margherita Shell, MD;  Location: [facility] INVASIVE CV LAB;  Service: Cardiovascular;  Laterality: Left;  SFA   PERIPHERAL VASCULAR BALLOON ANGIOPLASTY Right 12/13/2020   Procedure: PERIPHERAL VASCULAR BALLOON ANGIOPLASTY;  Surgeon: Dannis Dy, MD;  Location: [facility] INVASIVE CV LAB;  Service: Cardiovascular;  Laterality: Right;  Peroneal artery, anterior tibial artery   RECTAL EXAM UNDER ANESTHESIA N/A 10/24/2020   Procedure: EXAM UNDER ANESTHESIA;  Surgeon: Joyce Nixon, MD;  Location: [facility];  Service: General;  Laterality: N/A;   STUMP REVISION Left 10/19/2018   Procedure: REVISION LEFT BELOW KNEE AMPUTATION;  Surgeon: Timothy Ford, MD;  Location: [facility] OR;  Service: Orthopedics;  Laterality: Left;   TUBAL LIGATION  2002   Family History  Family History  Problem Relation Age of Onset   Diabetes Mother    Diabetes Daughter    Diabetes Daughter    Breast cancer Maternal Grandmother 12   Diabetes Maternal Grandmother    Diabetes Brother  Mental retardation Brother        died from PNA   Social History  reports that she quit smoking about 9 years ago. Her smoking use included cigarettes. She started smoking about 10 years ago. She has a 1 pack-year smoking history. She has never used smokeless tobacco. She reports that she does not currently use alcohol . She reports that she does not currently use drugs after having used the following drugs: Marijuana. Allergies No Known Allergies Home medications Prior to Admission medications   Medication Sig Start Date End Date Taking? Authorizing Provider  Accu-Chek Softclix Lancets lancets Use as instructed 05/02/21   Atway, Rayann N, DO  albuterol  (VENTOLIN  HFA) 108 (90 Base) MCG/ACT inhaler Inhale 2 puffs into the  lungs every 4 (four) hours as needed for wheezing or shortness of breath. 04/29/23   Alexander-Savino, Washington, MD  amLODipine -olmesartan  (AZOR ) 10-40 MG tablet Take 1 tablet by mouth daily. 11/19/23   Tawkaliyar, Roya, DO  aspirin  EC (EQ ASPIRIN  ADULT LOW DOSE) 81 MG tablet Take 1 tablet (81 mg total) by mouth daily. Swallow whole. 04/29/23   Alexander-Savino, [place], MD  atorvastatin  (LIPITOR ) 80 MG tablet Take 1 tablet (80 mg total) by mouth daily. 02/01/23   Allana Arabia, MD  bictegravir-emtricitabine -tenofovir  AF (BIKTARVY ) 50-200-25 MG TABS tablet Take 1 tablet by mouth daily. Try to take at the same time each day with or without food. 11/22/23   Manandhar, Sabina, MD  calcitRIOL  (ROCALTROL ) 0.25 MCG capsule Take 5 capsules (1.25 mcg total) by mouth Every Tuesday,Thursday,and Saturday with dialysis. 04/29/23   Alexander-Savino, [place], MD  calcium  acetate (PHOSLO ) 667 MG capsule Take 2 capsules (1,334 mg total) by mouth 3 (three) times daily with meals. 04/29/23   Alexander-Savino, [place], MD  carvedilol  (COREG ) 12.5 MG tablet Take 1 tablet (12.5 mg total) by mouth 2 (two) times daily with a meal. Patient not taking: Reported on 10/13/2023 07/26/23   Jonelle Neri, DO  carvedilol  (COREG ) 25 MG tablet Take by mouth. 09/07/23   [provider]  clopidogrel  (PLAVIX ) 75 MG tablet Take 1 tablet (75 mg total) by mouth daily. 04/29/23   Alexander-Savino, [place], MD  Continuous Blood Gluc Sensor (DEXCOM G7 SENSOR) MISC Use to check blood sugar continuously 06/30/22   Joette Mustard, MD  cyclobenzaprine  (FLEXERIL ) 5 MG tablet Take 1 tablet (5 mg total) by mouth 2 (two) times daily as needed for muscle spasms. 07/26/23   Jonelle Neri, DO  Darbepoetin Alfa  (ARANESP ) 200 MCG/0.4ML SOSY injection Inject 0.4 mLs (200 mcg total) into the skin every Monday at 6 PM. Patient taking differently: Inject 200 mcg into the skin every 7 (seven) days. Every Monday 07/06/22   Joette Mustard, MD  escitalopram   (LEXAPRO ) 5 MG tablet TAKE ONE TABLET BY MOUTH DAILY AT 9AM 11/04/23   Tawkaliyar, Roya, DO  glucose blood test strip Use as instructed 05/02/21   Atway, Rayann N, DO  hydrocortisone -pramoxine (PROCTOFOAM -HC) rectal foam Place 1 applicator rectally 2 (two) times daily. Patient not taking: Reported on 05/21/2023 11/07/22   Alissa April, MD  insulin  glargine (LANTUS  SOLOSTAR) 100 UNIT/ML Solostar Pen Inject 15 Units into the skin at bedtime. 09/22/23   Tawkaliyar, Roya, DO  Insulin  Pen Needle (PENTIPS) 32G X 4 MM MISC Use once daily with insulin  and once daily with victoza  09/30/23   Atway, Rayann N, DO  lidocaine  (LIDODERM ) 5 % Place 1 patch onto the skin daily. Remove & Discard patch within 12 hours or as directed by MD Patient not taking: Reported  on 05/21/2023 04/06/23   Nooruddin, Saad, MD  liraglutide  (VICTOZA ) 18 MG/3ML SOPN Inject 0.6 mg into the skin daily. 03/02/23   Amoako, Prince, MD  multivitamin (RENA-VIT) TABS tablet Take 1 tablet by mouth daily. Patient taking differently: Take 1 tablet by mouth See admin instructions. On Tuesday, Wednesday and Thursday 02/14/21   Sterling Eisenmenger, PA-C  mupirocin  ointment (BACTROBAN ) 2 % Apply topically 3 (three) times daily. Patient not taking: Reported on 05/21/2023 04/29/23   Isabell Manzanilla, [place], MD  Nystatin (GERHARDT'S BUTT CREAM) CREA Apply 1 Application topically 2 (two) times daily. Patient not taking: Reported on 05/21/2023 04/29/23   Isabell Manzanilla, [place], MD  pantoprazole  (PROTONIX ) 40 MG tablet Take 1 tablet (40 mg total) by mouth 2 (two) times daily. Patient not taking: Reported on 05/21/2023 12/25/22   McLendon, Michael, MD  psyllium (HYDROCIL/METAMUCIL) 95 % PACK Take 1 packet by mouth daily. 04/29/23   Alexander-Savino, Washington, MD  Ubrogepant  (UBRELVY ) 50 MG TABS Take 1 tablet (50 mg total) by mouth as needed (migraine headache). if needed a second dose may be taken at least 2 hours after the initial dose; MAX 8 doses in a month  08/31/23   Arellano Zameza, Priscila, MD     Vitals:   11/27/23 1000 11/27/23 1215 11/27/23 1230 11/27/23 1400  BP: (!) 156/78 (!) 180/85 (!) 190/84 (!) 175/74  Pulse: 90 92  89  Resp: 19   14  Temp:  98.6 F (37 C)    TempSrc:  Oral    SpO2: 100% 95%  100%   Exam Gen alert, no distress, on room air No rash, cyanosis or gangrene Sclera anicteric, throat clear  No jvd or bruits Chest clear bilat to bases, no rales/ wheezing Good air movement RRR no MRG Abd soft ntnd no mass or ascites +bs GU for MS bilateral AKA Ext no LE or UE edema, no other edema Neuro is alert, Ox 3 , nf    LUA AVF+bruit     Renal-related home meds: Amlodipine -olmesartan  10-40 once daily Rocaltrol  1.25 mcg TTS PhosLo  2 caps AC 3 times daily Coreg  12.5 mg twice daily Rena-Vite     OP HD: TTS [facility]  4h  B400   73.5kg   2K bath  AVG   Heparin  none Comes off 0-5kg over last 3 wks Last HD 4/19 post wt 76kg   Mircera 75 micrograms a 2 wks Rocaltrol  1 mcg  Venofer  50 weekly    K 3.9  BUN 28  creat 5.9   Alb 3.5   BNP 2762   Hb 11    CXR - normal chest, no congestion, no edema     CTA chest - pending results, mild GG changes  Assessment/ Plan: SOB: during HD today. CXR clear, 95-100%  sat on RA, no ^wob. CTA chest results are pending. BP's a bit high. BNP high for her. Doesn't look vol overloaded on exam. CTA  was negative as well.  ESRD: on HD TTS. Had 2 hrs HD today. SpO2's are very good. Imaging is normal by CXR and CT scan. Labs are good, not hypoxic or uremic. Doesn't require urgent HD. Will follow closely and dialyze over the weekend if an acute situation arises, otherwise next HD will be Monday.  HTN: BP's a bit high Volume: as above Anemia of esrd: Hb 11, follow Secondary hyperparathyroidism: CCa in range, cont binders w/ meals PAD: bilat amputee      Larry Poag  MD [facility] 11/27/2023, 3:41 PM  Recent Labs  Lab 11/27/23 0952  HGB 11.9*  ALBUMIN 3.5  CALCIUM  9.8  CREATININE 6.99*  K  3.9   Inpatient medications:  HYDROcodone -acetaminophen   1 tablet Oral Once    albuterol 

## 2023-11-28 ENCOUNTER — Observation Stay (HOSPITAL_BASED_OUTPATIENT_CLINIC_OR_DEPARTMENT_OTHER)

## 2023-11-28 DIAGNOSIS — E1122 Type 2 diabetes mellitus with diabetic chronic kidney disease: Secondary | ICD-10-CM | POA: Diagnosis not present

## 2023-11-28 DIAGNOSIS — G43109 Migraine with aura, not intractable, without status migrainosus: Secondary | ICD-10-CM | POA: Diagnosis not present

## 2023-11-28 DIAGNOSIS — I5031 Acute diastolic (congestive) heart failure: Secondary | ICD-10-CM

## 2023-11-28 DIAGNOSIS — Z992 Dependence on renal dialysis: Secondary | ICD-10-CM | POA: Diagnosis not present

## 2023-11-28 DIAGNOSIS — N186 End stage renal disease: Secondary | ICD-10-CM | POA: Diagnosis not present

## 2023-11-28 DIAGNOSIS — Z794 Long term (current) use of insulin: Secondary | ICD-10-CM | POA: Diagnosis not present

## 2023-11-28 DIAGNOSIS — F331 Major depressive disorder, recurrent, moderate: Secondary | ICD-10-CM

## 2023-11-28 DIAGNOSIS — I10 Essential (primary) hypertension: Secondary | ICD-10-CM | POA: Diagnosis not present

## 2023-11-28 DIAGNOSIS — E440 Moderate protein-calorie malnutrition: Secondary | ICD-10-CM

## 2023-11-28 LAB — BASIC METABOLIC PANEL WITH GFR
Anion gap: 17 — ABNORMAL HIGH (ref 5–15)
BUN: 48 mg/dL — ABNORMAL HIGH (ref 6–20)
CO2: 23 mmol/L (ref 22–32)
Calcium: 9.3 mg/dL (ref 8.9–10.3)
Chloride: 89 mmol/L — ABNORMAL LOW (ref 98–111)
Creatinine, Ser: 9.51 mg/dL — ABNORMAL HIGH (ref 0.44–1.00)
GFR, Estimated: 5 mL/min — ABNORMAL LOW (ref >60)
Glucose, Bld: 216 mg/dL — ABNORMAL HIGH (ref 70–99)
Potassium: 4.5 mmol/L (ref 3.5–5.1)
Sodium: 129 mmol/L — ABNORMAL LOW (ref 135–145)

## 2023-11-28 LAB — ECHOCARDIOGRAM COMPLETE
Area-P 1/2: 2.21 cm2
Height: 65 in
MV VTI: 2.03 cm2
S' Lateral: 3.4 cm
Weight: 2663.16 [oz_av]

## 2023-11-28 LAB — GLUCOSE, CAPILLARY
Glucose-Capillary: 312 mg/dL — ABNORMAL HIGH (ref 70–99)
Glucose-Capillary: 289 mg/dL — ABNORMAL HIGH (ref 70–99)
Glucose-Capillary: 220 mg/dL — ABNORMAL HIGH (ref 70–99)
Glucose-Capillary: 228 mg/dL — ABNORMAL HIGH (ref 70–99)

## 2023-11-28 LAB — HEPATITIS B SURFACE ANTIGEN: Hepatitis B Surface Ag: NONREACTIVE

## 2023-11-28 LAB — MRSA NEXT GEN BY PCR, NASAL: MRSA by PCR Next Gen: NOT DETECTED

## 2023-11-28 LAB — MAGNESIUM: Magnesium: 2.5 mg/dL — ABNORMAL HIGH (ref 1.7–2.4)

## 2023-11-28 MED ORDER — HYDROMORPHONE HCL 1 MG/ML IJ SOLN
1.0000 mg | Freq: Four times a day (QID) | INTRAMUSCULAR | Status: DC | PRN
  Administered 2023-11-28 – 2023-11-29 (×3): 1 mg via INTRAMUSCULAR
  Filled 2023-11-28 (×3): qty 1

## 2023-11-28 MED ORDER — CHLORHEXIDINE GLUCONATE CLOTH 2 % EX PADS
6.0000 | MEDICATED_PAD | Freq: Every day | CUTANEOUS | Status: DC
  Administered 2023-11-29: 6 via TOPICAL

## 2023-11-28 MED ORDER — PROCHLORPERAZINE 25 MG RE SUPP
25.0000 mg | Freq: Once | RECTAL | Status: AC
  Administered 2023-11-28: 25 mg via RECTAL
  Filled 2023-11-28: qty 1

## 2023-11-28 MED ORDER — INSULIN GLARGINE-YFGN 100 UNIT/ML ~~LOC~~ SOLN
12.0000 [IU] | Freq: Every day | SUBCUTANEOUS | Status: DC
  Administered 2023-11-28: 12 [IU] via SUBCUTANEOUS
  Filled 2023-11-28 (×2): qty 0.12

## 2023-11-28 MED ORDER — IRBESARTAN 300 MG PO TABS
300.0000 mg | ORAL_TABLET | Freq: Every day | ORAL | Status: DC
  Administered 2023-11-28: 300 mg via ORAL
  Filled 2023-11-28: qty 1

## 2023-11-28 MED ORDER — AMLODIPINE BESYLATE 10 MG PO TABS
10.0000 mg | ORAL_TABLET | Freq: Every day | ORAL | Status: DC
  Administered 2023-11-28: 10 mg via ORAL
  Filled 2023-11-28: qty 1

## 2023-11-28 MED ORDER — TRAMADOL HCL 50 MG PO TABS
50.0000 mg | ORAL_TABLET | Freq: Four times a day (QID) | ORAL | Status: DC | PRN
  Administered 2023-11-28: 50 mg via ORAL
  Filled 2023-11-28: qty 1

## 2023-11-28 NOTE — Assessment & Plan Note (Signed)
 Volume overload in the setting of non compliance with renal replacement therapy.   Plan to continue with carvedilol , amlodipine  and irbesartan .  Continue blood pressure monitoring

## 2023-11-28 NOTE — Assessment & Plan Note (Signed)
 Continue with ropinarole and escitalopram .

## 2023-11-28 NOTE — Hospital Course (Signed)
 Kathy Frank was admitted to the hospital with the working diagnosis of volume overload.   48 yo female with the past medical history of ESRD on HD, (TTS), hypertension, hyperlipidemia, bilateral BKA, coronary artery disease, CVA, depression and T2DM who presented with dyspnea.  Patient missed her HD session, 48 hrs prior to admission and on the day of admission her HD had to be stopped earlier  (after 2 hrs) due to dyspnea. EMS was called she was transported to the ED.  On her initial physical examination her blood pressure was 192/100, HR 92, R 14 and 02 saturation 95%, lungs with no wheezing or rhonchi, heart with S1 and S2 present and regular, abdomen with no distention, bilateral BKA.  AV fistula left upper extremity.   Na 137 K 3,9 Cl 95 bicarbonate 22, glucose 131 bun 28 cr 6.9  AST 12 ALT 7  BNP 2,762  High sensitive troponin 66 and 65  Wbc 6,4 hgb 11,9 plt 313   Chest radiograph with cardiomegaly and bilateral hilar vascular congestion with cephalization of the vasculature.   CT chest negative for acute pulmonary embolism or thoracic aortic dissection.  Faint bilateral ground glass opacities.  CT head with no acute findings. Old lacunar infarcts in both thalami, old infarction in the right basal ganglia and chronic small vessel ischemic changes of the white matter. No intracranial hemorrhage.   EKG 91 bpm, normal axis, left bundle branch block, qtc 498, sinus rhythm with no significant ST segment or T wave changes.   04/21 improved headache, pending renal replacement therapy.

## 2023-11-28 NOTE — Care Management Obs Status (Signed)
 MEDICARE OBSERVATION STATUS NOTIFICATION   Patient Details  Name: Kathy Frank MRN: [id number] Date of Birth: August 01, 1976   Medicare Observation Status Notification Given:  Yes    Omie Bickers, RN 11/28/2023, 12:31 PM

## 2023-11-28 NOTE — Assessment & Plan Note (Signed)
 Hypertensive emergency  Plan to continue blood pressure control with amlodipine , irbesartan  and carvedilol .

## 2023-11-28 NOTE — Assessment & Plan Note (Addendum)
 Continue glucose cover and monitoring with insulin  sliding scale.  Basal insulin  12 units.  Capillary glucose 312 and 289, consistent with uncontrolled hyperglycemia.  In the setting of ESRD will have caution with insulin  therapy.

## 2023-11-28 NOTE — Plan of Care (Signed)
  Problem: Nutritional: Goal: Maintenance of adequate nutrition will improve Outcome: Progressing   Problem: Tissue Perfusion: Goal: Adequacy of tissue perfusion will improve Outcome: Progressing   Problem: Clinical Measurements: Goal: Respiratory complications will improve Outcome: Progressing Goal: Cardiovascular complication will be avoided Outcome: Progressing   Problem: Safety: Goal: Ability to remain free from injury will improve Outcome: Progressing

## 2023-11-28 NOTE — Progress Notes (Signed)
 Progress Note   Patient: Kathy Frank:[id number] DOB: May 13, 1976 DOA: 11/27/2023     0 DOS: the patient was seen and examined on 11/28/2023   Brief hospital course: Mrs Bove was admitted to the hospital with the working diagnosis of volume overload.   48 yo female with the past medical history of ESRD on HD, (TTS), hypertension, hyperlipidemia, bilateral BKA, coronary artery disease, CVA, depression and T2DM who presented with dyspnea.  Patient missed her HD session, 48 hrs prior to admission and on the day of admission her HD had to be stopped earlier  (after 2 hrs) due to dyspnea. EMS was called she was transported to the ED.  On her initial physical examination her blood pressure was 192/100, HR 92, R 14 and 02 saturation 95%, lungs with no wheezing or rhonchi, heart with S1 and S2 present and regular, abdomen with no distention, bilateral BKA.  AV fistula left upper extremity.   Na 137 K 3,9 Cl 95 bicarbonate 22, glucose 131 bun 28 cr 6.9  AST 12 ALT 7  BNP 2,762  High sensitive troponin 66 and 65  Wbc 6,4 hgb 11,9 plt 313   Chest radiograph with cardiomegaly and bilateral hilar vascular congestion with cephalization of the vasculature.   CT chest negative for acute pulmonary embolism or thoracic aortic dissection.  Faint bilateral ground glass opacities.  CT head with no acute findings. Old lacunar infarcts in both thalami, old infarction in the right basal ganglia and chronic small vessel ischemic changes of the white matter. No intracranial hemorrhage.   EKG 91 bpm, normal axis, left bundle branch block, qtc 498, sinus rhythm with no significant ST segment or T wave changes.     Assessment and Plan: * ESRD on dialysis (HCC) Positive volume overload.  Her dyspnea has improved, and her 02 saturation is 100% on room air.  She continue making urine.  Not able to get labs today.  Metabolic bone disease, continue with calcitriol  and calcium  acetate.   Plan to get labs  tomorrow on HD HD tomorrow with ultrafiltration.  Acute diastolic CHF (congestive heart failure) (HCC) Volume overload in the setting of non compliance with renal replacement therapy.   Plan to continue with carvedilol , amlodipine  and irbesartan .  Continue blood pressure monitoring   Essential hypertension Hypertensive emergency  Plan to continue blood pressure control with amlodipine , irbesartan  and carvedilol .   Type 2 diabetes mellitus (HCC) Continue glucose cover and monitoring with insulin  sliding scale.  Basal insulin  12 units.  Capillary glucose 312 and 289, consistent with uncontrolled hyperglycemia.  In the setting of ESRD will have caution with insulin  therapy.   Human immunodeficiency virus (HIV) disease (HCC) Continue antiretroviral therapy   Moderate protein-calorie malnutrition (HCC) Continue with nutritional supplements   Depression Continue with ropinarole and escitalopram .   Migraine with aura Sp fioricet  ad seteroids with no improvement  Added as needed po tramadol  and IM hydromorphone .  She has no IV access due to difficult peripheral access placement.,         Subjective: Patient is having headache and mild to moderate dyspnea, no chest pain, no nausea or vomiting   Physical Exam: Vitals:   11/28/23 0331 11/28/23 0442 11/28/23 0813 11/28/23 1220  BP: (!) 154/95 (!) 150/125 (!) 152/83 (!) 186/84  Pulse: 83 85 94 85  Resp: 15 16 18 18   Temp: 97.7 F (36.5 C) 98.2 F (36.8 C) 97.9 F (36.6 C) 99.9 F (37.7 C)  TempSrc: Oral Oral Oral Oral  SpO2: 97% 99% 100% 100%  Weight:      Height:       Neurology awake and alert ENT with mild pallor with no icterus Cardiovascular with S1 and S2 present and regular with no gallops, rubs or murmurs No JVD Respiratory with rales bilaterally with no wheezing or rhonchi  Abdomen with no distention  Bilateral BKA  Data Reviewed:    Family Communication: no family at the bedside    Disposition: Status is: Observation The patient remains OBS appropriate and will d/c before 2 midnights.  Planned Discharge Destination: Home    Author: Albertus Alt, MD 11/28/2023 2:01 PM  For on call review [email or web address].

## 2023-11-28 NOTE — Plan of Care (Signed)
  Problem: Coping: Goal: Ability to adjust to condition or change in health will improve Outcome: Progressing   Problem: Fluid Volume: Goal: Ability to maintain a balanced intake and output will improve Outcome: Progressing   Problem: Health Behavior/Discharge Planning: Goal: Ability to identify and utilize available resources and services will improve Outcome: Progressing   Problem: Metabolic: Goal: Ability to maintain appropriate glucose levels will improve Outcome: Progressing   Problem: Nutritional: Goal: Maintenance of adequate nutrition will improve Outcome: Progressing   

## 2023-11-28 NOTE — Assessment & Plan Note (Addendum)
 Sp fioricet  ad seteroids with no improvement. She received IM hydromorphone  with improvement in her headache.

## 2023-11-28 NOTE — Assessment & Plan Note (Signed)
 Continue with nutritional supplements.

## 2023-11-28 NOTE — Progress Notes (Signed)
 Patient peripheral IV access was infiltrated, IV team consulted for IV line insertion but unable to found vein and suggested central line insertion. Informed DR. Crosley about the suggestion and patient status after giving medicines for migraine, pt is asleep and SBP is 150's; DBP 90's, comfortable, no signs of any distress. Dr. Gerhardt Knudsen placed an order for day IV team consult.

## 2023-11-28 NOTE — Progress Notes (Signed)
 Another consult was placed to IV Therapy for new access;  the IV that was placed with ultrasound , for the CT scan, was removed;   pt is limited to R arm only for all labs and IVs;  Pt was assessed on night shift and no suitable veins were noted;  This Clinical research associate is the 3rd IV Nurse to see the pt;  she is "good" with leaving the IV out now; she said she "got a shot in her muscle for her headache" before;  Secure chats sent to RN and MD;  will hold IV restart at this time; RN to place new consult if IV Team is needed again.

## 2023-11-28 NOTE — Assessment & Plan Note (Addendum)
 Positive volume overload. Hyponatremia.  Her dyspnea has improved, and her 02 saturation is 96% on room air.  She continue making urine.   Yesterday 18:00 hrs Na 129, K 4.5 Cl 89, bicarbonate 23 bun 48 and cr 9,51   Metabolic bone disease, continue with calcitriol  and calcium  acetate.   For renal replacement therapy inpatient prior to her discharge.

## 2023-11-28 NOTE — Progress Notes (Addendum)
 [facility] Progress Note   Subjective:   Patient seen in room. She was comfortably resting in her bed. Plan for HD on 11/29/23. She reports that her dyspnea is better. No complaints today. We discussed her chest xray, CT scan, and answered all of her questions  Objective Vitals:   11/28/23 0055 11/28/23 0331 11/28/23 0442 11/28/23 0813  BP: (!) 158/80 (!) 154/95 (!) 150/125 (!) 152/83  Pulse: 85 83 85 94  Resp: 14 15 16 18   Temp: 99.4 F (37.4 C) 97.7 F (36.5 C) 98.2 F (36.8 C) 97.9 F (36.6 C)  TempSrc: Oral Oral Oral Oral  SpO2: 99% 97% 99% 100%  Weight:      Height:       Physical Exam General: Pleasant AA female, no acute distress Heart: RRR, no murmur, rubs or gallops Lungs: Bibasilar rales and not on oxygen Abdomen: soft, NT/ND/ BS+ Extremities: No UE edema, bilateral AKAs, no thigh edema Dialysis Access: LA AVG  Additional Objective Labs: Basic Metabolic Panel: Recent Labs  Lab 11/27/23 0952 11/27/23 2206  NA 137  --   K 3.9  --   CL 95*  --   CO2 22  --   GLUCOSE 131*  --   BUN 28*  --   CREATININE 6.99* 8.19*  CALCIUM  9.8  --    Liver Function Tests: Recent Labs  Lab 11/27/23 0952  AST 12*  ALT 7  ALKPHOS 52  BILITOT 0.6  PROT 9.0*  ALBUMIN 3.5   CBC: Recent Labs  Lab 11/27/23 0952 11/27/23 2206  WBC 6.4 6.3  NEUTROABS 4.6  --   HGB 11.9* 11.3*  HCT 37.9 35.5*  MCV 93.6 93.7  PLT 313 299    Cardiac Enzymes: No results for input(s): "CKTOTAL", "CKMB", "CKMBINDEX", "TROPONINI" in the last 168 hours. CBG: Recent Labs  Lab 11/27/23 2204 11/27/23 2304 11/28/23 0624  GLUCAP 229* 211* 312*     Studies/Results: CT Angio Chest PE W and/or Wo Contrast Result Date: 11/27/2023 CLINICAL DATA:  Shortness of breath, headache EXAM: CT ANGIOGRAPHY CHEST WITH CONTRAST TECHNIQUE: Multidetector CT imaging of the chest was performed using the standard protocol during bolus administration of intravenous contrast. Multiplanar CT  image reconstructions and MIPs were obtained to evaluate the vascular anatomy. RADIATION DOSE REDUCTION: This exam was performed according to the departmental dose-optimization program which includes automated exposure control, adjustment of the mA and/or kV according to patient size and/or use of iterative reconstruction technique. CONTRAST:  75mL OMNIPAQUE  IOHEXOL  350 MG/ML SOLN COMPARISON:  10/01/2022 FINDINGS: Cardiovascular: Heart size normal. No pericardial effusion. The RV is nondilated. Satisfactory opacification of pulmonary arteries noted, and there is no evidence of pulmonary emboli. Mitral annulus calcifications. Extensive coronary and moderate aortic calcifications. No aneurysm. Mediastinum/Nodes: No hematoma, mass, or adenopathy. Subcentimeter left axillary lymph nodes. Lungs/Pleura: No pleural effusion. No pneumothorax. Mild dependent atelectasis posteriorly in the lung bases. 4 mm subpleural ground-glass nodule in the anterior right upper lobe (Im53,Se8) , stable since previous. Chronic scarring in the medial basal segment right lower lobe. Upper Abdomen: Extensive visceral and renal artery calcifications. No acute findings. Musculoskeletal: No chest wall abnormality. No acute or significant osseous findings. Review of the MIP images confirms the above findings. IMPRESSION: 1. Negative for acute PE or thoracic aortic dissection. 2. Coronary and aortic Atherosclerosis (ICD10-I70.0). Electronically Signed   By: Nicoletta Barrier M.D.   On: 11/27/2023 16:48   CT Head Wo Contrast Result Date: 11/27/2023 CLINICAL DATA:  New onset headache.  Assess for intracranial hemorrhage prior to anticoagulation. EXAM: CT HEAD WITHOUT CONTRAST TECHNIQUE: Contiguous axial images were obtained from the base of the skull through the vertex without intravenous contrast. RADIATION DOSE REDUCTION: This exam was performed according to the departmental dose-optimization program which includes automated exposure control, adjustment  of the mA and/or kV according to patient size and/or use of iterative reconstruction technique. COMPARISON:  04/26/2023 FINDINGS: Brain: No focal abnormality seen affecting the brainstem or cerebellum by CT. 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 No evidence of acute infarction, mass lesion, hemorrhage, hydrocephalus or extra-axial collection. A linear density previously seen in the left parietal region is present in September of 2024, presumably vascular, and does not represent an acute hemorrhage. Vascular: There is atherosclerotic calcification of the major vessels at the base of the brain. Skull: Negative Sinuses/Orbits: Mild seasonal mucosal inflammatory changes. No advanced sinusitis. Orbits negative. Other: None IMPRESSION: No acute CT finding. 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 No intracranial hemorrhage or contraindication to anticoagulation is visible. Electronically Signed   By: Bettylou Brunner M.D.   On: 11/27/2023 13:38   DG Chest Portable 1 View Result Date: 11/27/2023 CLINICAL DATA:  Shortness of breath EXAM: PORTABLE CHEST 1 VIEW COMPARISON:  04/20/2023 FINDINGS: Mild cardiomegaly. Coronary artery calcification and/or stents. Aortic atherosclerotic calcification. The lungs are clear. The vascularity is normal. No infiltrate, collapse or effusion. Previously seen right internal jugular catheter is no longer present. IMPRESSION: No active disease. Mild cardiomegaly. Coronary artery calcification and/or stents. Aortic atherosclerotic calcification. Electronically Signed   By: Bettylou Brunner M.D.   On: 11/27/2023 10:19   Medications:   amLODipine   10 mg Oral Daily   And   irbesartan   300 mg Oral Daily   aspirin  EC  81 mg Oral Daily   atorvastatin   80 mg  Oral Daily   bictegravir-emtricitabine -tenofovir  AF  1 tablet Oral Daily   [START ON 11/30/2023] calcitRIOL   1.25 mcg Oral Q T,Th,Sa-HD   calcium  acetate  1,334 mg Oral TID WC   carvedilol   3.125 mg Oral BID WC   escitalopram   5 mg Oral Daily   heparin   5,000 Units Subcutaneous Q8H   insulin  aspart  0-6 Units Subcutaneous TID WC   insulin  glargine-yfgn  8 Units Subcutaneous Q2200   multivitamin  1 tablet Oral QHS   pantoprazole   40 mg Oral BID   psyllium  1 packet Oral Daily   rOPINIRole   0.25 mg Oral QHS   sodium zirconium cyclosilicate   5 g Oral Daily    Dialysis Orders  OP HD: TTS [facility]  4h  B400   73.5kg   2K bath   LUA AVG   Heparin  none Comes off 0-5kg over last 3 wks Last HD 4/19 post wt 76kg   Mircera 75 micrograms q 2 wks. Last Dose 11/11/23 Rocaltrol  1 mcg  Venofer  50 weekly     K 3.9  BUN 28  creat 8.19   Alb 3.5   BNP 2762   Hb 11.3   CXR - normal chest, no congestion, mild edema   CTA chest - CTA chest results show no pleural effusion or pneumothorax. Mild dependent atelectasis posteriorly in lung bases. 4 mm subpleural ground-glass nodule in anterior right upper lobe.    Assessment/ Plan: SOB: Getting better. CXR  clear, 95-100%  sat on RA, no ^wob. CTA chest results show no pleural effusion or pneumothorax. Mild dependent atelectasis posteriorly in lung bases. 4 mm subpleural ground-glass nodule in anterior right upper lobe. BP's a bit high. BNP high for her. Doesn't look vol overloaded on exam. CTA  was negative as well.  ESRD: on HD TTS. Had 2 hrs HD 4/19 but only had HD for 2:14 hours. SpO2's are very good. Imaging is normal by CXR and CT scan. Labs are good, not hypoxic or uremic. Doesn't require urgent HD. Next HD will be Monday 11/29/23.  HTN: BP's a bit high. 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 Last Mircera 75 mcg on 11/11/23 Secondary hyperparathyroidism: CCa in range, cont binders w/ meals PAD: bilat ampute   Hersey Lorenzo, NP-C 11/28/2023, 11:53 AM  [facility]

## 2023-11-28 NOTE — Assessment & Plan Note (Signed)
 Continue antiretroviral therapy

## 2023-11-29 ENCOUNTER — Other Ambulatory Visit (HOSPITAL_COMMUNITY): Payer: Self-pay

## 2023-11-29 DIAGNOSIS — I10 Essential (primary) hypertension: Secondary | ICD-10-CM | POA: Diagnosis not present

## 2023-11-29 DIAGNOSIS — I5031 Acute diastolic (congestive) heart failure: Secondary | ICD-10-CM | POA: Diagnosis not present

## 2023-11-29 DIAGNOSIS — E440 Moderate protein-calorie malnutrition: Secondary | ICD-10-CM | POA: Diagnosis not present

## 2023-11-29 DIAGNOSIS — Z992 Dependence on renal dialysis: Secondary | ICD-10-CM | POA: Diagnosis not present

## 2023-11-29 DIAGNOSIS — N186 End stage renal disease: Secondary | ICD-10-CM | POA: Diagnosis not present

## 2023-11-29 DIAGNOSIS — E1122 Type 2 diabetes mellitus with diabetic chronic kidney disease: Secondary | ICD-10-CM | POA: Diagnosis not present

## 2023-11-29 DIAGNOSIS — Z794 Long term (current) use of insulin: Secondary | ICD-10-CM | POA: Diagnosis not present

## 2023-11-29 DIAGNOSIS — G43109 Migraine with aura, not intractable, without status migrainosus: Secondary | ICD-10-CM | POA: Diagnosis not present

## 2023-11-29 LAB — RENAL FUNCTION PANEL
Albumin: 2.9 g/dL — ABNORMAL LOW (ref 3.5–5.0)
Anion gap: 20 — ABNORMAL HIGH (ref 5–15)
BUN: 54 mg/dL — ABNORMAL HIGH (ref 6–20)
CO2: 22 mmol/L (ref 22–32)
Calcium: 9.1 mg/dL (ref 8.9–10.3)
Chloride: 86 mmol/L — ABNORMAL LOW (ref 98–111)
Creatinine, Ser: 10.9 mg/dL — ABNORMAL HIGH (ref 0.44–1.00)
GFR, Estimated: 4 mL/min — ABNORMAL LOW (ref >60)
Glucose, Bld: 193 mg/dL — ABNORMAL HIGH (ref 70–99)
Phosphorus: 7.6 mg/dL — ABNORMAL HIGH (ref 2.5–4.6)
Potassium: 4.4 mmol/L (ref 3.5–5.1)
Sodium: 128 mmol/L — ABNORMAL LOW (ref 135–145)

## 2023-11-29 LAB — CBC
HCT: 32.4 % — ABNORMAL LOW (ref 36.0–46.0)
Hemoglobin: 10.4 g/dL — ABNORMAL LOW (ref 12.0–15.0)
MCH: 29.6 pg (ref 26.0–34.0)
MCHC: 32.1 g/dL (ref 30.0–36.0)
MCV: 92.3 fL (ref 80.0–100.0)
Platelets: 321 10*3/uL (ref 150–400)
RBC: 3.51 MIL/uL — ABNORMAL LOW (ref 3.87–5.11)
RDW: 15.3 % (ref 11.5–15.5)
WBC: 5.4 10*3/uL (ref 4.0–10.5)
nRBC: 0 % (ref 0.0–0.2)

## 2023-11-29 LAB — GLUCOSE, CAPILLARY
Glucose-Capillary: 136 mg/dL — ABNORMAL HIGH (ref 70–99)
Glucose-Capillary: 152 mg/dL — ABNORMAL HIGH (ref 70–99)

## 2023-11-29 MED ORDER — PENTAFLUOROPROP-TETRAFLUOROETH EX AERO: 1.0000 | INHALATION_SPRAY | CUTANEOUS | Status: DC | PRN

## 2023-11-29 MED ORDER — HEPARIN SODIUM (PORCINE) 1000 UNIT/ML DIALYSIS: 1000.0000 [IU] | INTRAMUSCULAR | Status: DC | PRN

## 2023-11-29 MED ORDER — LIDOCAINE HCL (PF) 1 % IJ SOLN: 5.0000 mL | INTRAMUSCULAR | Status: DC | PRN

## 2023-11-29 MED ORDER — LIDOCAINE-PRILOCAINE 2.5-2.5 % EX CREA: 1.0000 | TOPICAL_CREAM | CUTANEOUS | Status: DC | PRN

## 2023-11-29 MED ORDER — ANTICOAGULANT SODIUM CITRATE 4% (200MG/5ML) IV SOLN: 5.0000 mL | Status: DC | PRN

## 2023-11-29 MED ORDER — LOKELMA 5 G PO PACK
1.0000 | PACK | Freq: Every day | ORAL | 0 refills | Status: AC
  Filled 2023-11-29: qty 30, 30d supply, fill #0

## 2023-11-29 MED ORDER — CARVEDILOL 12.5 MG PO TABS
12.5000 mg | ORAL_TABLET | Freq: Two times a day (BID) | ORAL | 0 refills | Status: DC
  Filled 2023-11-29: qty 60, 30d supply, fill #0

## 2023-11-29 MED ORDER — HYDROMORPHONE HCL 1 MG/ML IJ SOLN
1.0000 mg | Freq: Once | INTRAMUSCULAR | Status: AC
  Administered 2023-11-29: 1 mg via INTRAVENOUS
  Filled 2023-11-29: qty 1

## 2023-11-29 NOTE — Progress Notes (Signed)
 Progress Note   Patient: Kathy Frank WUJ:[id number] DOB: [birth year] DOA: 11/27/2023     0 DOS: the patient was seen and examined on 11/29/2023   Brief hospital course: Mrs Mencer was admitted to the hospital with the working diagnosis of volume overload.   48 yo female with the past medical history of ESRD on HD, (TTS), hypertension, hyperlipidemia, bilateral BKA, coronary artery disease, CVA, depression and T2DM who presented with dyspnea.  Patient missed her HD session, 48 hrs prior to admission and on the day of admission her HD had to be stopped earlier  (after 2 hrs) due to dyspnea. EMS was called she was transported to the ED.  On her initial physical examination her blood pressure was 192/100, HR 92, R 14 and 02 saturation 95%, lungs with no wheezing or rhonchi, heart with S1 and S2 present and regular, abdomen with no distention, bilateral BKA.  AV fistula left upper extremity.   Na 137 K 3,9 Cl 95 bicarbonate 22, glucose 131 bun 28 cr 6.9  AST 12 ALT 7  BNP 2,762  High sensitive troponin 66 and 65  Wbc 6,4 hgb 11,9 plt 313   Chest radiograph with cardiomegaly and bilateral hilar vascular congestion with cephalization of the vasculature.   CT chest negative for acute pulmonary embolism or thoracic aortic dissection.  Faint bilateral ground glass opacities.  CT head with no acute findings. Old lacunar infarcts in both thalami, old infarction in the right basal ganglia and chronic small vessel ischemic changes of the white matter. No intracranial hemorrhage.   EKG 91 bpm, normal axis, left bundle branch block, qtc 498, sinus rhythm with no significant ST segment or T wave changes.   04/21 improved headache, pending renal replacement therapy.   Assessment and Plan: * ESRD on dialysis (HCC) Positive volume overload. Hyponatremia.  Her dyspnea has improved, and her 02 saturation is 96% on room air.  She continue making urine.   Yesterday 18:00 hrs Na 129, K 4.5 Cl 89,  bicarbonate 23 bun 48 and cr 9,51   Metabolic bone disease, continue with calcitriol  and calcium  acetate.   For renal replacement therapy inpatient prior to her discharge.   Acute diastolic CHF (congestive heart failure) (HCC) Volume overload in the setting of non compliance with renal replacement therapy.   Plan to continue with carvedilol , amlodipine  and irbesartan .  Continue blood pressure monitoring   Essential hypertension Hypertensive emergency  Plan to continue blood pressure control with amlodipine , irbesartan  and carvedilol .   Type 2 diabetes mellitus (HCC) Continue glucose cover and monitoring with insulin  sliding scale.  Basal insulin  12 units.  Uncontrolled hyperglycemia.  In the setting of ESRD will have caution with insulin  therapy.   Human immunodeficiency virus (HIV) disease (HCC) Continue antiretroviral therapy   Moderate protein-calorie malnutrition (HCC) Continue with nutritional supplements   Depression Continue with ropinarole and escitalopram .   Migraine with aura Sp fioricet  ad seteroids with no improvement  Added as needed po tramadol  and IM hydromorphone .  She has no IV access due to difficult peripheral access placement.,         Subjective: Patient with no chest pain, her dyspnea is stable and her headache has improved with analgesics   Physical Exam: Vitals:   11/29/23 0105 11/29/23 0527 11/29/23 0730 11/29/23 1147  BP: (!) 172/78 (!) 168/68 (!) 162/83 (!) 146/64  Pulse: 77  73 75  Resp: 20 20 11 16   Temp: 98 F (36.7 C) 98 F (36.7 C) 98.2 F (  36.8 C) (!) 97.5 F (36.4 C)  TempSrc: Oral Oral Oral Oral  SpO2: 100% 99% 100% 96%  Weight:  76 kg    Height:       Neurology awake and alert ENT with mild pallor Cardiovascular with S1 and S2 present and regular with no gallops Respiratory with mild rales at bases with no wheezing Abdomen with no distention  Bilateral BKA  Data Reviewed:    Family Communication: no family at  the bedside   Disposition: Status is: Observation The patient remains OBS appropriate and will d/c before 2 midnights.  Planned Discharge Destination: Home     Author: Albertus Alt, MD 11/29/2023 2:47 PM  For on call review [email or web address].

## 2023-11-29 NOTE — Procedures (Signed)
 Received patient in bed to unit.  Alert and oriented.  Informed consent signed and in chart.   TX duration: 2 hours 24 min  Patient signed off early, see note.   Transported back to the room  Alert, without acute distress.  Hand-off given to patient's nurse.   Access used: left graft Access issues: frequent alarming due to increased venous pressure  Total UF removed: 2.7 liters   Clover Dao, RN [facility]

## 2023-11-29 NOTE — Procedures (Signed)
 Continues to c/o cramping, UF remains in pause. 100 ml NS bolus given.

## 2023-11-29 NOTE — Progress Notes (Signed)
 [facility] NEPHROLOGY PROGRESS NOTE  Assessment/ Plan: Pt is a 48 y.o. yo female   Dialysis Orders  OP HD: TTS [facility]  4h  B400   73.5kg   2K bath   LUA AVG   Heparin  none Comes off 0-5kg over last 3 wks Last HD 4/19 post wt 76kg   Mircera 75 micrograms q 2 wks. Last Dose 11/11/23 Rocaltrol  1 mcg  Venofer  50 weekly.  # SOB: Getting better.  CTA chest results show no pleural effusion or pneumothorax. CTA  was negative as well.  Manage volume with HD.  # ESRD: on HD TTS. Had 2 hrs HD 4/19 but only had HD for 2:14 hours.  Likely dialysis today versus tomorrow depending on the scheduling.  # HTN: Continue antihypertensives and volume management with HD.  # Anemia of esrd: Hb at goal. Last Mircera 75 mcg on 11/11/23.  # Secondary hyperparathyroidism: CCa in range, cont binders w/ meals.  #PAD: bilat ampute.  Subjective: Seen and examined.  Breathing is much better.  Denies nausea, vomiting.  No new event. Objective Vital signs in last 24 hours: Vitals:   11/29/23 0105 11/29/23 0527 11/29/23 0730 11/29/23 1147  BP: (!) 172/78 (!) 168/68 (!) 162/83 (!) 146/64  Pulse: 77  73 75  Resp: 20 20 11 16   Temp: 98 F (36.7 C) 98 F (36.7 C) 98.2 F (36.8 C) (!) 97.5 F (36.4 C)  TempSrc: Oral Oral Oral Oral  SpO2: 100% 99% 100% 96%  Weight:  76 kg    Height:       Weight change: 0.5 kg  Intake/Output Summary (Last 24 hours) at 11/29/2023 1305 Last data filed at 11/28/2023 2000 Gross per 24 hour  Intake 120 ml  Output --  Net 120 ml       Labs: RENAL PANEL Recent Labs  Lab 11/27/23 0952 11/27/23 2206 11/28/23 1850  NA 137  --  129*  K 3.9  --  4.5  CL 95*  --  89*  CO2 22  --  23  GLUCOSE 131*  --  216*  BUN 28*  --  48*  CREATININE 6.99* 8.19* 9.51*  CALCIUM  9.8  --  9.3  MG  --   --  2.5*  ALBUMIN 3.5  --   --     Liver Function Tests: Recent Labs  Lab 11/27/23 0952  AST 12*  ALT 7  ALKPHOS 52  BILITOT 0.6  PROT 9.0*  ALBUMIN 3.5   No  results for input(s): "LIPASE", "AMYLASE" in the last 168 hours. No results for input(s): "AMMONIA" in the last 168 hours. CBC: Recent Labs    04/27/23 0649 04/28/23 0834 04/29/23 1049 11/27/23 0952 11/27/23 2206  HGB 11.6* 12.8 11.5* 11.9* 11.3*  MCV 88.4 89.9 89.0 93.6 93.7    Cardiac Enzymes: No results for input(s): "CKTOTAL", "CKMB", "CKMBINDEX", "TROPONINI" in the last 168 hours. CBG: Recent Labs  Lab 11/28/23 1208 11/28/23 1616 11/28/23 2106 11/29/23 0630 11/29/23 1149  GLUCAP 289* 220* 228* 136* 152*    Iron  Studies: No results for input(s): "IRON ", "TIBC", "TRANSFERRIN", "FERRITIN" in the last 72 hours. Studies/Results: ECHOCARDIOGRAM COMPLETE Result Date: 11/28/2023    ECHOCARDIOGRAM REPORT   Patient Name:   Kathy Frank Date of Exam: 11/28/2023 Medical Rec #:  [id number]       Height:       65.0 in Accession #:    [id number]      Weight:  166.4 lb Date of Birth:  08-30-1975        BSA:          1.830 m Patient Age:    48 years        BP:           152/83 mmHg Patient Gender: F               HR:           84 bpm. Exam Location:  Inpatient Procedure: 2D Echo, Cardiac Doppler and Color Doppler (Both Spectral and Color            Flow Doppler were utilized during procedure). Indications:    CHF Acute Diastolic I50.31  History:        Patient has prior history of Echocardiogram examinations, most                 recent 04/27/2023. CHF; Risk Factors:Diabetes and Hypertension.  Sonographer:    Kip Peon Referring Phys: [id number] Michela Aguas IMPRESSIONS  1. Left ventricular ejection fraction, by estimation, is 60 to 65%. The left ventricle has normal function. The left ventricle has no regional wall motion abnormalities. Left ventricular diastolic parameters are consistent with Grade I diastolic dysfunction (impaired relaxation).  2. Right ventricular systolic function was not well visualized. The right ventricular size is mildly enlarged. There is normal pulmonary artery  systolic pressure.  3. Left atrial size was moderately dilated.  4. The mitral valve is abnormal. Trivial mitral valve regurgitation. No evidence of mitral stenosis. Severe mitral annular calcification.  5. The aortic valve has an indeterminant number of cusps. Aortic valve regurgitation is not visualized. No aortic stenosis is present.  6. The inferior vena cava is normal in size with greater than 50% respiratory variability, suggesting right atrial pressure of 3 mmHg. FINDINGS  Left Ventricle: Left ventricular ejection fraction, by estimation, is 60 to 65%. The left ventricle has normal function. The left ventricle has no regional wall motion abnormalities. Strain was performed and the global longitudinal strain is indeterminate. The left ventricular internal cavity size was normal in size. There is no left ventricular hypertrophy. Left ventricular diastolic parameters are consistent with Grade I diastolic dysfunction (impaired relaxation). Right Ventricle: The right ventricular size is mildly enlarged. No increase in right ventricular wall thickness. Right ventricular systolic function was not well visualized. There is normal pulmonary artery systolic pressure. The tricuspid regurgitant velocity is 2.57 m/s, and with an assumed right atrial pressure of 3 mmHg, the estimated right ventricular systolic pressure is 29.4 mmHg. Left Atrium: Left atrial size was moderately dilated. Right Atrium: Right atrial size was normal in size. Pericardium: There is no evidence of pericardial effusion. Mitral Valve: The mitral valve is abnormal. Severe mitral annular calcification. Trivial mitral valve regurgitation. No evidence of mitral valve stenosis. MV peak gradient, 11.3 mmHg. The mean mitral valve gradient is 5.0 mmHg. Tricuspid Valve: The tricuspid valve is normal in structure. Tricuspid valve regurgitation is mild . No evidence of tricuspid stenosis. Aortic Valve: The aortic valve has an indeterminant number of cusps.  Aortic valve regurgitation is not visualized. No aortic stenosis is present. Pulmonic Valve: The pulmonic valve was not well visualized. Pulmonic valve regurgitation is not visualized. No evidence of pulmonic stenosis. Aorta: The aortic root and ascending aorta are structurally normal, with no evidence of dilitation. Venous: The inferior vena cava is normal in size with greater than 50% respiratory variability, suggesting right atrial pressure of 3 mmHg. IAS/Shunts:  No atrial level shunt detected by color flow Doppler. Additional Comments: 3D was performed not requiring image post processing on an independent workstation and was indeterminate.  LEFT VENTRICLE PLAX 2D LVIDd:         4.70 cm LVIDs:         3.40 cm LV PW:         1.10 cm LV IVS:        1.10 cm LVOT diam:     2.00 cm LV SV:         80 LV SV Index:   44 LVOT Area:     3.14 cm  RIGHT VENTRICLE          IVC RV Basal diam:  4.50 cm  IVC diam: 1.50 cm RV Mid diam:    3.70 cm LEFT ATRIUM           Index        RIGHT ATRIUM           Index LA diam:      3.60 cm 1.97 cm/m   RA Area:     18.10 cm LA Vol (A4C): 76.8 ml 41.98 ml/m  RA Volume:   51.10 ml  27.93 ml/m  AORTIC VALVE LVOT Vmax:   149.00 cm/s LVOT Vmean:  90.300 cm/s LVOT VTI:    0.255 m  AORTA Ao Root diam: 2.60 cm Ao Asc diam:  3.30 cm MITRAL VALVE                TRICUSPID VALVE MV Area (PHT): 2.21 cm     TR Peak grad:   26.4 mmHg MV Area VTI:   2.03 cm     TR Vmax:        257.00 cm/s MV Peak grad:  11.3 mmHg MV Mean grad:  5.0 mmHg     SHUNTS MV Vmax:       1.68 m/s     Systemic VTI:  0.26 m MV Vmean:      105.0 cm/s   Systemic Diam: 2.00 cm MV Decel Time: 343 msec MV E velocity: 108.00 cm/s MV A velocity: 130.00 cm/s MV E/A ratio:  0.83 Vishnu Priya Mallipeddi Electronically signed by Lucetta Russel Mallipeddi Signature Date/Time: 11/28/2023/1:10:40 PM    Final    CT Angio Chest PE W and/or Wo Contrast Result Date: 11/27/2023 CLINICAL DATA:  Shortness of breath, headache EXAM: CT ANGIOGRAPHY  CHEST WITH CONTRAST TECHNIQUE: Multidetector CT imaging of the chest was performed using the standard protocol during bolus administration of intravenous contrast. Multiplanar CT image reconstructions and MIPs were obtained to evaluate the vascular anatomy. RADIATION DOSE REDUCTION: This exam was performed according to the departmental dose-optimization program which includes automated exposure control, adjustment of the mA and/or kV according to patient size and/or use of iterative reconstruction technique. CONTRAST:  75mL OMNIPAQUE  IOHEXOL  350 MG/ML SOLN COMPARISON:  10/01/2022 FINDINGS: Cardiovascular: Heart size normal. No pericardial effusion. The RV is nondilated. Satisfactory opacification of pulmonary arteries noted, and there is no evidence of pulmonary emboli. Mitral annulus calcifications. Extensive coronary and moderate aortic calcifications. No aneurysm. Mediastinum/Nodes: No hematoma, mass, or adenopathy. Subcentimeter left axillary lymph nodes. Lungs/Pleura: No pleural effusion. No pneumothorax. Mild dependent atelectasis posteriorly in the lung bases. 4 mm subpleural ground-glass nodule in the anterior right upper lobe (Im53,Se8) , stable since previous. Chronic scarring in the medial basal segment right lower lobe. Upper Abdomen: Extensive visceral and renal artery calcifications. No acute findings. Musculoskeletal: No chest wall abnormality.  No acute or significant osseous findings. Review of the MIP images confirms the above findings. IMPRESSION: 1. Negative for acute PE or thoracic aortic dissection. 2. Coronary and aortic Atherosclerosis (ICD10-I70.0). Electronically Signed   By: Nicoletta Barrier M.D.   On: 11/27/2023 16:48   CT Head Wo Contrast Result Date: 11/27/2023 CLINICAL DATA:  New onset headache. Assess for intracranial hemorrhage prior to anticoagulation. EXAM: CT HEAD WITHOUT CONTRAST TECHNIQUE: Contiguous axial images were obtained from the base of the skull through the vertex without  intravenous contrast. RADIATION DOSE REDUCTION: This exam was performed according to the departmental dose-optimization program which includes automated exposure control, adjustment of the mA and/or kV according to patient size and/or use of iterative reconstruction technique. COMPARISON:  04/26/2023 FINDINGS: Brain: No focal abnormality seen affecting the brainstem or cerebellum by CT. 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 No evidence of acute infarction, mass lesion, hemorrhage, hydrocephalus or extra-axial collection. A linear density previously seen in the left parietal region is present in September of 2024, presumably vascular, and does not represent an acute hemorrhage. Vascular: There is atherosclerotic calcification of the major vessels at the base of the brain. Skull: Negative Sinuses/Orbits: Mild seasonal mucosal inflammatory changes. No advanced sinusitis. Orbits negative. Other: None IMPRESSION: No acute CT finding. 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 No intracranial hemorrhage or contraindication to anticoagulation is visible. Electronically Signed   By: Bettylou Brunner M.D.   On: 11/27/2023 13:38    Medications: Infusions:  anticoagulant sodium citrate       Scheduled Medications:  amLODipine   10 mg Oral Daily   And   irbesartan   300 mg Oral Daily   aspirin  EC  81 mg Oral Daily   atorvastatin   80 mg Oral Daily   bictegravir-emtricitabine -tenofovir  AF  1 tablet Oral Daily   [START ON 11/30/2023] calcitRIOL   1.25 mcg Oral Q T,Th,Sa-HD   calcium  acetate  1,334 mg Oral TID WC   carvedilol   3.125 mg Oral BID WC   Chlorhexidine  Gluconate Cloth  6 each Topical Q0600   escitalopram   5 mg Oral Daily   heparin   5,000 Units Subcutaneous Q8H   insulin   aspart  0-6 Units Subcutaneous TID WC   insulin  glargine-yfgn  12 Units Subcutaneous Q2200   multivitamin  1 tablet Oral QHS   pantoprazole   40 mg Oral BID   psyllium  1 packet Oral Daily   rOPINIRole   0.25 mg Oral QHS    have reviewed scheduled and prn medications.  Physical Exam: General:NAD, comfortable Heart:RRR, s1s2 nl Lungs:clear b/l, no crackle Abdomen:soft, Non-tender, non-distended Extremities:No edema Dialysis Access: Left upper extremity AV graft.  Giulio Bertino Prasad Mistina Coatney 11/29/2023,1:05 PM  LOS: 0 days

## 2023-11-29 NOTE — Discharge Summary (Signed)
 Physician Discharge Summary   Patient: Kathy Frank MRN: [id number] DOB: 30-Aug-1975  Admit date:     11/27/2023  Discharge date: 11/29/23  Discharge Physician: Curlee Doss Bryann Gentz   PCP: Lanney Pitts, DO   Recommendations at discharge:    Patient had HD today and will continue with her regular schedule of outpatient HD tomorrow.  Continue blood pressure control and close follow up as outpatient.  Follow up renal function and electrolytes with in 7 days. Follow up with Dr Winston Hawking in 7 to 10 days. Follow up with Nephrology as scheduled   Discharge Diagnoses: Principal Problem:   ESRD on dialysis [facility] [place]) Active Problems:   Acute diastolic CHF (congestive heart failure) (HCC)   Essential hypertension   Type 2 diabetes mellitus (HCC)   Human immunodeficiency virus (HIV) disease (HCC)   Moderate protein-calorie malnutrition (HCC)   Depression   Migraine with aura  Resolved Problems:   * No resolved hospital problems. [facility] Course: [name] was admitted to the hospital with the working diagnosis of volume overload.   48 yo female with the past medical history of ESRD on HD, (TTS), hypertension, hyperlipidemia, bilateral BKA, coronary artery disease, CVA, depression and T2DM who presented with dyspnea.  Patient missed her HD session, 48 hrs prior to admission and on the day of admission her HD had to be stopped earlier  (after 2 hrs) due to dyspnea. EMS was called she was transported to the ED.  On her initial physical examination her blood pressure was 192/100, HR 92, R 14 and 02 saturation 95%, lungs with no wheezing or rhonchi, heart with S1 and S2 present and regular, abdomen with no distention, bilateral BKA.  AV fistula left upper extremity.   Na 137 K 3,9 Cl 95 bicarbonate 22, glucose 131 bun 28 cr 6.9  AST 12 ALT 7  BNP 2,762  High sensitive troponin 66 and 65  Wbc 6,4 hgb 11,9 plt 313   Chest radiograph with cardiomegaly and bilateral hilar vascular  congestion with cephalization of the vasculature.   CT chest negative for acute pulmonary embolism or thoracic aortic dissection.  Faint bilateral ground glass opacities.  CT head with no acute findings. Old lacunar infarcts in both thalami, old infarction in the right basal ganglia and chronic small vessel ischemic changes of the white matter. No intracranial hemorrhage.   EKG 91 bpm, normal axis, left bundle branch block, qtc 498, sinus rhythm with no significant ST segment or T wave changes.   04/21 improved headache, pending renal replacement therapy.   Assessment and Plan: * ESRD on dialysis (HCC) Positive volume overload. Hyponatremia.  Her dyspnea has improved, and her 02 saturation is 96% on room air.  She continue making urine.   Yesterday 18:00 hrs Na 129, K 4.5 Cl 89, bicarbonate 23 bun 48 and cr 9,51   Metabolic bone disease, continue with calcitriol  and calcium  acetate.   For renal replacement therapy inpatient prior to her discharge.   Acute diastolic CHF (congestive heart failure) (HCC) Volume overload in the setting of non compliance with renal replacement therapy.   Plan to continue with carvedilol , amlodipine  and irbesartan .  Continue blood pressure monitoring   Essential hypertension Hypertensive emergency  Plan to continue blood pressure control with amlodipine , irbesartan  and carvedilol .   Type 2 diabetes mellitus (HCC) Basal insulin  12 units.  Uncontrolled hyperglycemia.  In the setting of ESRD will have caution with insulin  therapy.   Human immunodeficiency virus (HIV) disease (HCC) Continue antiretroviral  therapy   Moderate protein-calorie malnutrition (HCC) Continue with nutritional supplements   Depression Continue with ropinarole and escitalopram .   Migraine with aura Sp fioricet  ad seteroids with no improvement. She received IM hydromorphone  with improvement in her headache.          Consultants: nephrology  Procedures performed:  none   Disposition: Home Diet recommendation:  Cardiac and Carb modified diet DISCHARGE MEDICATION: Allergies as of 11/29/2023   No Known Allergies      Medication List     STOP taking these medications    clopidogrel  75 MG tablet Commonly known as: PLAVIX    Darbepoetin Alfa  200 MCG/0.4ML Sosy injection Commonly known as: ARANESP    Gerhardt's butt cream Crea   pantoprazole  40 MG tablet Commonly known as: Protonix        TAKE these medications    Accu-Chek Softclix Lancets lancets Use as instructed   amLODipine -olmesartan  10-40 MG tablet Commonly known as: AZOR  Take 1 tablet by mouth daily.   aspirin  EC 81 MG tablet Commonly known as: EQ Aspirin  Adult Low Dose Take 1 tablet (81 mg total) by mouth daily. Swallow whole.   atorvastatin  80 MG tablet Commonly known as: LIPITOR  Take 1 tablet (80 mg total) by mouth daily.   Biktarvy  50-200-25 MG Tabs tablet Generic drug: bictegravir-emtricitabine -tenofovir  AF Take 1 tablet by mouth daily. Try to take at the same time each day with or without food.   busPIRone  5 MG tablet Commonly known as: BUSPAR  Take 2.5 mg by mouth 2 (two) times daily. Take 1/2 tablet twice a day at 9am & 5pm.   calcitRIOL  0.25 MCG capsule Commonly known as: ROCALTROL  Take 5 capsules (1.25 mcg total) by mouth Every Tuesday,Thursday,and Saturday with dialysis.   calcium  acetate 667 MG capsule Commonly known as: PHOSLO  Take 2 capsules (1,334 mg total) by mouth 3 (three) times daily with meals.   carvedilol  12.5 MG tablet Commonly known as: COREG  Take 1 tablet (12.5 mg total) by mouth 2 (two) times daily with a meal. What changed:  medication strength how much to take when to take this   cyclobenzaprine  5 MG tablet Commonly known as: FLEXERIL  Take 1 tablet (5 mg total) by mouth 2 (two) times daily as needed for muscle spasms.   Dexcom G7 Sensor Misc Use to check blood sugar continuously   escitalopram  5 MG tablet Commonly known as:  LEXAPRO  TAKE ONE TABLET BY MOUTH DAILY AT 9AM   glucose blood test strip Use as instructed   Lantus  SoloStar 100 UNIT/ML Solostar Pen Generic drug: insulin  glargine Inject 15 Units into the skin at bedtime.   Lokelma  5 g packet Generic drug: sodium zirconium cyclosilicate  Take 5 g (1 packet total) by mouth daily.   multivitamin Tabs tablet Take 1 tablet by mouth daily. What changed:  when to take this additional instructions   Pentips 32G X 4 MM Misc Generic drug: Insulin  Pen Needle Use once daily with insulin  and once daily with victoza    psyllium 95 % Pack Commonly known as: HYDROCIL/METAMUCIL Take 1 packet by mouth daily.   rOPINIRole  0.25 MG tablet Commonly known as: REQUIP  Take 0.25 mg by mouth at bedtime.   Ubrelvy  50 MG Tabs Generic drug: Ubrogepant  Take 1 tablet (50 mg total) by mouth as needed (migraine headache). if needed a second dose may be taken at least 2 hours after the initial dose; MAX 8 doses in a month   Ventolin  HFA 108 (90 Base) MCG/ACT inhaler Generic drug: albuterol  Inhale 2 puffs into  the lungs every 4 (four) hours as needed for wheezing or shortness of breath.   Victoza  18 MG/3ML Sopn Generic drug: liraglutide  Inject 0.6 mg into the skin daily.        Follow-up Information     Tawkaliyar, Roya, DO. Go on 12/01/2023.   Specialty: Internal Medicine Why: @1 :15pm please arrive 15 minutes early. Contact information: [address] [place] [address] [phone]                Discharge Exam: Cleavon Curls Weights   11/27/23 2330 11/29/23 0527  Weight: 75.5 kg 76 kg   Subjective: Patient with no chest pain, her dyspnea is stable and her headache has improved with analgesics   Neurology awake and alert ENT with mild pallor Cardiovascular with S1 and S2 present and regular with no gallops Respiratory with mild rales at bases with no wheezing Abdomen with no distention  Bilateral BKA  Condition at discharge: stable  The  results of significant diagnostics from this hospitalization (including imaging, microbiology, ancillary and laboratory) are listed below for reference.   Imaging Studies: ECHOCARDIOGRAM COMPLETE Result Date: 11/28/2023    ECHOCARDIOGRAM REPORT   Patient Name:   AYEN VIVIANO Date of Exam: 11/28/2023 Medical Rec #:  [id number]       Height:       65.0 in Accession #:    [id number]      Weight:       166.4 lb Date of Birth:  July 16, 1976        BSA:          1.830 m Patient Age:    48 years        BP:           152/83 mmHg Patient Gender: F               HR:           84 bpm. Exam Location:  Inpatient Procedure: 2D Echo, Cardiac Doppler and Color Doppler (Both Spectral and Color            Flow Doppler were utilized during procedure). Indications:    CHF Acute Diastolic I50.31  History:        Patient has prior history of Echocardiogram examinations, most                 recent 04/27/2023. CHF; Risk Factors:Diabetes and Hypertension.  Sonographer:    Kip Peon Referring Phys: [id number] Michela Aguas IMPRESSIONS  1. Left ventricular ejection fraction, by estimation, is 60 to 65%. The left ventricle has normal function. The left ventricle has no regional wall motion abnormalities. Left ventricular diastolic parameters are consistent with Grade I diastolic dysfunction (impaired relaxation).  2. Right ventricular systolic function was not well visualized. The right ventricular size is mildly enlarged. There is normal pulmonary artery systolic pressure.  3. Left atrial size was moderately dilated.  4. The mitral valve is abnormal. Trivial mitral valve regurgitation. No evidence of mitral stenosis. Severe mitral annular calcification.  5. The aortic valve has an indeterminant number of cusps. Aortic valve regurgitation is not visualized. No aortic stenosis is present.  6. The inferior vena cava is normal in size with greater than 50% respiratory variability, suggesting right atrial pressure of 3 mmHg. FINDINGS  Left  Ventricle: Left ventricular ejection fraction, by estimation, is 60 to 65%. The left ventricle has normal function. The left ventricle has no regional wall motion abnormalities. Strain was performed and the global longitudinal  strain is indeterminate. The left ventricular internal cavity size was normal in size. There is no left ventricular hypertrophy. Left ventricular diastolic parameters are consistent with Grade I diastolic dysfunction (impaired relaxation). Right Ventricle: The right ventricular size is mildly enlarged. No increase in right ventricular wall thickness. Right ventricular systolic function was not well visualized. There is normal pulmonary artery systolic pressure. The tricuspid regurgitant velocity is 2.57 m/s, and with an assumed right atrial pressure of 3 mmHg, the estimated right ventricular systolic pressure is 29.4 mmHg. Left Atrium: Left atrial size was moderately dilated. Right Atrium: Right atrial size was normal in size. Pericardium: There is no evidence of pericardial effusion. Mitral Valve: The mitral valve is abnormal. Severe mitral annular calcification. Trivial mitral valve regurgitation. No evidence of mitral valve stenosis. MV peak gradient, 11.3 mmHg. The mean mitral valve gradient is 5.0 mmHg. Tricuspid Valve: The tricuspid valve is normal in structure. Tricuspid valve regurgitation is mild . No evidence of tricuspid stenosis. Aortic Valve: The aortic valve has an indeterminant number of cusps. Aortic valve regurgitation is not visualized. No aortic stenosis is present. Pulmonic Valve: The pulmonic valve was not well visualized. Pulmonic valve regurgitation is not visualized. No evidence of pulmonic stenosis. Aorta: The aortic root and ascending aorta are structurally normal, with no evidence of dilitation. Venous: The inferior vena cava is normal in size with greater than 50% respiratory variability, suggesting right atrial pressure of 3 mmHg. IAS/Shunts: No atrial level shunt  detected by color flow Doppler. Additional Comments: 3D was performed not requiring image post processing on an independent workstation and was indeterminate.  LEFT VENTRICLE PLAX 2D LVIDd:         4.70 cm LVIDs:         3.40 cm LV PW:         1.10 cm LV IVS:        1.10 cm LVOT diam:     2.00 cm LV SV:         80 LV SV Index:   44 LVOT Area:     3.14 cm  RIGHT VENTRICLE          IVC RV Basal diam:  4.50 cm  IVC diam: 1.50 cm RV Mid diam:    3.70 cm LEFT ATRIUM           Index        RIGHT ATRIUM           Index LA diam:      3.60 cm 1.97 cm/m   RA Area:     18.10 cm LA Vol (A4C): 76.8 ml 41.98 ml/m  RA Volume:   51.10 ml  27.93 ml/m  AORTIC VALVE LVOT Vmax:   149.00 cm/s LVOT Vmean:  90.300 cm/s LVOT VTI:    0.255 m  AORTA Ao Root diam: 2.60 cm Ao Asc diam:  3.30 cm MITRAL VALVE                TRICUSPID VALVE MV Area (PHT): 2.21 cm     TR Peak grad:   26.4 mmHg MV Area VTI:   2.03 cm     TR Vmax:        257.00 cm/s MV Peak grad:  11.3 mmHg MV Mean grad:  5.0 mmHg     SHUNTS MV Vmax:       1.68 m/s     Systemic VTI:  0.26 m MV Vmean:      105.0 cm/s   Systemic  Diam: 2.00 cm MV Decel Time: 343 msec MV E velocity: 108.00 cm/s MV A velocity: 130.00 cm/s MV E/A ratio:  0.83 Vishnu Priya Mallipeddi Electronically signed by Lucetta Russel Mallipeddi Signature Date/Time: 11/28/2023/1:10:40 PM    Final    CT Angio Chest PE W and/or Wo Contrast Result Date: 11/27/2023 CLINICAL DATA:  Shortness of breath, headache EXAM: CT ANGIOGRAPHY CHEST WITH CONTRAST TECHNIQUE: Multidetector CT imaging of the chest was performed using the standard protocol during bolus administration of intravenous contrast. Multiplanar CT image reconstructions and MIPs were obtained to evaluate the vascular anatomy. RADIATION DOSE REDUCTION: This exam was performed according to the departmental dose-optimization program which includes automated exposure control, adjustment of the mA and/or kV according to patient size and/or use of iterative  reconstruction technique. CONTRAST:  75mL OMNIPAQUE  IOHEXOL  350 MG/ML SOLN COMPARISON:  10/01/2022 FINDINGS: Cardiovascular: Heart size normal. No pericardial effusion. The RV is nondilated. Satisfactory opacification of pulmonary arteries noted, and there is no evidence of pulmonary emboli. Mitral annulus calcifications. Extensive coronary and moderate aortic calcifications. No aneurysm. Mediastinum/Nodes: No hematoma, mass, or adenopathy. Subcentimeter left axillary lymph nodes. Lungs/Pleura: No pleural effusion. No pneumothorax. Mild dependent atelectasis posteriorly in the lung bases. 4 mm subpleural ground-glass nodule in the anterior right upper lobe (Im53,Se8) , stable since previous. Chronic scarring in the medial basal segment right lower lobe. Upper Abdomen: Extensive visceral and renal artery calcifications. No acute findings. Musculoskeletal: No chest wall abnormality. No acute or significant osseous findings. Review of the MIP images confirms the above findings. IMPRESSION: 1. Negative for acute PE or thoracic aortic dissection. 2. Coronary and aortic Atherosclerosis (ICD10-I70.0). Electronically Signed   By: Nicoletta Barrier M.D.   On: 11/27/2023 16:48   CT Head Wo Contrast Result Date: 11/27/2023 CLINICAL DATA:  New onset headache. Assess for intracranial hemorrhage prior to anticoagulation. EXAM: CT HEAD WITHOUT CONTRAST TECHNIQUE: Contiguous axial images were obtained from the base of the skull through the vertex without intravenous contrast. RADIATION DOSE REDUCTION: This exam was performed according to the departmental dose-optimization program which includes automated exposure control, adjustment of the mA and/or kV according to patient size and/or use of iterative reconstruction technique. COMPARISON:  04/26/2023 FINDINGS: Brain: No focal abnormality seen affecting the brainstem or cerebellum by CT. Cerebral hemispheres show old lacunar infarctions in both thalami, old infarction in the right basal  ganglia, and chronic small-vessel ischemic changes of the white matter. No evidence of acute infarction, mass lesion, hemorrhage, hydrocephalus or extra-axial collection. A linear density previously seen in the left parietal region is present in September of 2024, presumably vascular, and does not represent an acute hemorrhage. Vascular: There is atherosclerotic calcification of the major vessels at the base of the brain. Skull: Negative Sinuses/Orbits: Mild seasonal mucosal inflammatory changes. No advanced sinusitis. Orbits negative. Other: None IMPRESSION: No acute CT finding. 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 No intracranial hemorrhage or contraindication to anticoagulation is visible. Electronically Signed   By: Bettylou Brunner M.D.   On: 11/27/2023 13:38   DG Chest Portable 1 View Result Date: 11/27/2023 CLINICAL DATA:  Shortness of breath EXAM: PORTABLE CHEST 1 VIEW COMPARISON:  04/20/2023 FINDINGS: Mild cardiomegaly. Coronary artery calcification and/or stents. Aortic atherosclerotic calcification. The lungs are clear. The vascularity is normal. No infiltrate, collapse or effusion. Previously seen right internal jugular catheter is no longer present. IMPRESSION: No active disease. Mild cardiomegaly. Coronary artery calcification and/or  stents. Aortic atherosclerotic calcification. Electronically Signed   By: Bettylou Brunner M.D.   On: 11/27/2023 10:19    Microbiology: Results for orders placed or performed during the hospital encounter of 11/27/23  MRSA Next Gen by PCR, Nasal     Status: None   Collection Time: 11/27/23 11:59 PM   Specimen: Nasal Mucosa; Nasal Swab  Result Value Ref Range Status   MRSA by PCR Next Gen NOT DETECTED NOT DETECTED Final    Comment: (NOTE) The GeneXpert MRSA Assay (FDA approved for NASAL specimens only), is one component of a comprehensive MRSA colonization  surveillance program. It is not intended to diagnose MRSA infection nor to guide or monitor treatment for MRSA infections. Test performance is not FDA approved in patients less than 45 years old. Performed at [facility], [address] [place], [place] [address]    *Note: Due to a large number of results and/or encounters for the requested time period, some results have not been displayed. A complete set of results can be found in Results Review.    Labs: CBC: Recent Labs  Lab 11/27/23 0952 11/27/23 2206  WBC 6.4 6.3  NEUTROABS 4.6  --   HGB 11.9* 11.3*  HCT 37.9 35.5*  MCV 93.6 93.7  PLT 313 299   Basic Metabolic Panel: Recent Labs  Lab 11/27/23 0952 11/27/23 2206 11/28/23 1850  NA 137  --  129*  K 3.9  --  4.5  CL 95*  --  89*  CO2 22  --  23  GLUCOSE 131*  --  216*  BUN 28*  --  48*  CREATININE 6.99* 8.19* 9.51*  CALCIUM  9.8  --  9.3  MG  --   --  2.5*   Liver Function Tests: Recent Labs  Lab 11/27/23 0952  AST 12*  ALT 7  ALKPHOS 52  BILITOT 0.6  PROT 9.0*  ALBUMIN 3.5   CBG: Recent Labs  Lab 11/28/23 1208 11/28/23 1616 11/28/23 2106 11/29/23 0630 11/29/23 1149  GLUCAP 289* 220* 228* 136* 152*    Discharge time spent: greater than 30 minutes.  Signed: Albertus Alt, MD [facility] 11/29/2023

## 2023-11-29 NOTE — Discharge Planning (Signed)
 [facility] Patient Discharge Orders - [facility] CLINIC: [facility]  Patient's name: Kathy Frank Admit/DC Dates: 11/27/2023 - 11/29/23  DISCHARGE DIAGNOSES: Dyspnea/pulm edema     HD ORDER CHANGES: Heparin  change: no EDW Change: YES New EDW: 73kg Bath Change: no  ANEMIA MANAGEMENT: Aranesp : Given: no   ESA dose for discharge: Per protocol IV Iron  dose at discharge: Per protocol Transfusion: Given: no  BONE/MINERAL MEDICATIONS: Hectorol/Calcitriol  change: no Sensipar /Parsabiv change: no  ACCESS INTERVENTION/CHANGE: no Details:   RECENT LABS: Recent Labs  Lab 11/27/23 0952 11/27/23 2206 11/28/23 1850 11/29/23 1230  HGB 11.9*   < >  --  10.4*  NA 137  --  129*  --   K 3.9  --  4.5  --   CALCIUM  9.8  --  9.3  --   ALBUMIN 3.5  --   --   --    < > = values in this interval not displayed.    IV ANTIBIOTICS: no Details:  OTHER ANTICOAGULATION: On Coumadin?: no Last INR: Managed By:  OTHER/APPTS/LAB ORDERS:   D/C Meds to be reconciled by nurse after every discharge.  Completed By: Janus Mercury, PA-C [facility] Pager [phone]   Reviewed by: MD:______ RN_______

## 2023-11-29 NOTE — Progress Notes (Signed)
 Heart Failure Navigator Progress Note  Assessed for Heart & Vascular TOC clinic readiness.  Patient does not meet criteria due to ESRD on hemodialysis.   Navigator will sign off at this time.   Rhae Hammock, BSN, Scientist, clinical (histocompatibility and immunogenetics) Only

## 2023-11-29 NOTE — Procedures (Signed)
 Patient requested to end tx early, c/o cramps and being uncomfortable. UF remains off, advised patient I could try to give her more saline but patient reports she just wants to end treatment for the night. AMA signed.

## 2023-11-29 NOTE — Progress Notes (Signed)
 Initial Nutrition Assessment  DOCUMENTATION CODES:   Not applicable  INTERVENTION:  Rena-vite daily.   NUTRITION DIAGNOSIS:   Increased nutrient needs related to  (ESRD) as evidenced by estimated needs.  GOAL:   Patient will meet greater than or equal to 90% of their needs  MONITOR:   PO intake, Supplement acceptance  REASON FOR ASSESSMENT:   Consult Assessment of nutrition requirement/status  ASSESSMENT:   PMH of HTN, HLP, CAD, CVA, DM type II, depression, HIV, ESRD on dialysis TTS presents with complaints of shortness of breath after missing dialysis treatment days before. Adm for ESRD.  Intake: Nursing flowsheets document meal completions between 100% x 1.  Pt is at dialysis at time of RD visit.   Medications reviewed and include: SSI 0-6 units, Semglee , Rena-vite, protonix , calcium  acetate,   Labs reviewed: Sodium 129 L, Calcium  ionized 0.88 L, creatinine 9.51 H, Magnesium  2.5 L   Intake/Output Summary (Last 24 hours) at 11/29/2023 1531 Last data filed at 11/28/2023 2000 Gross per 24 hour  Intake 120 ml  Output --  Net 120 ml    NUTRITION - FOCUSED PHYSICAL EXAM:  Defer to f/u  Diet Order:   Diet Order             Diet heart healthy/carb modified Room service appropriate? Yes; Fluid consistency: Thin  Diet effective now                   EDUCATION NEEDS:   Not appropriate for education at this time  Skin:  Skin Assessment: Reviewed RN Assessment  Last BM:  4/19  Height:   Ht Readings from Last 1 Encounters:  11/27/23 5\' 5"  (1.651 m)    Weight:   Wt Readings from Last 1 Encounters:  11/29/23 76 kg    BMI:  Body mass index is 27.88 kg/m.  Estimated Nutritional Needs:   Kcal:  1800-2100  Protein:  90-115  Fluid:  >1.8 L/day or per MD  Laren Player, MPH, RD, LDN Clinical Dietitian Contact information can be found at [facility].

## 2023-11-29 NOTE — Progress Notes (Signed)
 D/C order noted. Contacted [facility] to be advised of pt's d/c today and that pt should resume care tomorrow.   Olivia Canter Renal Navigator [phone]

## 2023-11-29 NOTE — Procedures (Signed)
 C/o cramping in both sides, UF placed in pause.

## 2023-11-29 NOTE — TOC Transition Note (Signed)
 Transition of Care [facility]) - Discharge Note   Patient Details  Name: Kathy Frank MRN: [id number] Date of Birth: August 19, 1975  Transition of Care [facility]) CM/SW Contact:  Jennett Model, RN Phone Number: 11/29/2023, 10:33 AM   Clinical Narrative:    For possible dc today, she has no needs. She states her daughter Jola Nash will transport her home.          Patient Goals and CMS Choice            Discharge Placement                       Discharge Plan and Services Additional resources added to the After Visit Summary for                                       Social Drivers of Health (SDOH) Interventions SDOH Screenings   Food Insecurity: No Food Insecurity (11/27/2023)  Housing: Low Risk  (11/27/2023)  Transportation Needs: No Transportation Needs (11/27/2023)  Utilities: Not At Risk (11/27/2023)  Alcohol  Screen: Low Risk  (10/19/2022)  Depression (PHQ2-9): Medium Risk (05/21/2023)  Financial Resource Strain: Low Risk  (10/19/2022)  Physical Activity: Inactive (10/19/2022)  Social Connections: Socially Isolated (10/19/2022)  Stress: Stress Concern Present (10/19/2022)  Tobacco Use: Medium Risk (11/27/2023)     Readmission Risk Interventions    12/24/2022   11:30 AM 05/23/2021    2:13 PM 04/22/2021    3:05 PM  Readmission Risk Prevention Plan  Transportation Screening Complete Complete Complete  Medication Review [facility]) Complete Complete Complete  PCP or Specialist appointment within 3-5 days of discharge Complete Complete Complete  HRI or Home Care Consult Complete Complete Complete  SW Recovery Care/Counseling Consult Complete Complete Complete  Palliative Care Screening Not Applicable Not Applicable Not Applicable  Skilled Nursing Facility Not Applicable Not Applicable Not Applicable

## 2023-11-29 NOTE — Plan of Care (Signed)

## 2023-11-29 NOTE — Plan of Care (Signed)
  Problem: Tissue Perfusion: Goal: Adequacy of tissue perfusion will improve Outcome: Progressing   Problem: Health Behavior/Discharge Planning: Goal: Ability to manage health-related needs will improve Outcome: Progressing   Problem: Clinical Measurements: Goal: Respiratory complications will improve Outcome: Progressing Goal: Cardiovascular complication will be avoided Outcome: Progressing   Problem: Activity: Goal: Risk for activity intolerance will decrease Outcome: Progressing   Problem: Safety: Goal: Ability to remain free from injury will improve Outcome: Progressing   Problem: Skin Integrity: Goal: Risk for impaired skin integrity will decrease Outcome: Progressing

## 2023-11-29 NOTE — TOC CM/SW Note (Signed)
 Transition of Care [facility]) - Inpatient Brief Assessment   Patient Details  Name: Kathy Frank MRN: [id number] Date of Birth: 11-17-1975  Transition of [facility]) CM/SW Contact:    Jennett Model, RN Phone Number: 11/29/2023, 10:32 AM   Clinical Narrative: From home alone w/chair bound, has PCP and insurance on file, states has no HH services in place at this time or DME at home.  States family member will transport them home at [employer] and family is support system, states gets medications from [facility] [place].  Pta indep with w/chair.  She gives this NCM permission to speak with her daughter Elton Ham.    Transition of Care Asessment: Insurance and Status: Insurance coverage has been reviewed Patient has primary care physician: Yes Home environment has been reviewed: home alone, Prior level of function:: indep with w/chair Prior/Current Home Services: Current home services (w/chair, shower chair) Social Drivers of Health Review: SDOH reviewed no interventions necessary Readmission risk has been reviewed: Yes Transition of care needs: no transition of care needs at this time

## 2023-11-30 ENCOUNTER — Telehealth: Payer: Self-pay

## 2023-11-30 ENCOUNTER — Telehealth (HOSPITAL_COMMUNITY): Payer: Self-pay | Admitting: Nephrology

## 2023-11-30 DIAGNOSIS — E1110 Type 2 diabetes mellitus with ketoacidosis without coma: Secondary | ICD-10-CM | POA: Diagnosis not present

## 2023-11-30 DIAGNOSIS — R197 Diarrhea, unspecified: Secondary | ICD-10-CM | POA: Diagnosis not present

## 2023-11-30 DIAGNOSIS — Z992 Dependence on renal dialysis: Secondary | ICD-10-CM | POA: Diagnosis not present

## 2023-11-30 DIAGNOSIS — N186 End stage renal disease: Secondary | ICD-10-CM | POA: Diagnosis not present

## 2023-11-30 DIAGNOSIS — L299 Pruritus, unspecified: Secondary | ICD-10-CM | POA: Diagnosis not present

## 2023-11-30 DIAGNOSIS — E875 Hyperkalemia: Secondary | ICD-10-CM | POA: Diagnosis not present

## 2023-11-30 DIAGNOSIS — D631 Anemia in chronic kidney disease: Secondary | ICD-10-CM | POA: Diagnosis not present

## 2023-11-30 DIAGNOSIS — N2581 Secondary hyperparathyroidism of renal origin: Secondary | ICD-10-CM | POA: Diagnosis not present

## 2023-11-30 DIAGNOSIS — R519 Headache, unspecified: Secondary | ICD-10-CM | POA: Diagnosis not present

## 2023-11-30 LAB — HEPATITIS B SURFACE ANTIBODY, QUANTITATIVE: Hep B S AB Quant (Post): 104 m[IU]/mL

## 2023-11-30 NOTE — Transitions of Care (Post Inpatient/ED Visit) (Signed)
   11/30/2023  Name: Kathy Frank MRN: [id number] DOB: 05/05/1976  Today's TOC FU Call Status: Today's TOC FU Call Status:: Unsuccessful Call (1st Attempt) Unsuccessful Call (1st Attempt) Date: 11/30/23  Attempted to reach the patient regarding the most recent Inpatient/ED visit.  Follow Up Plan: Additional outreach attempts will be made to reach the patient to complete the Transitions of Care (Post Inpatient/ED visit) call.   Signature Darrall Ellison, LPN [facility] Nurse Health Advisor Direct Dial  [phone]

## 2023-11-30 NOTE — Telephone Encounter (Signed)
 Transition of care contact from inpatient facility  Date of Discharge: 11/29/23 Date of Contact: 11/30/23 - attempt #1 Method of contact: Phone  Attempted to contact patient to discuss transition of care from inpatient admission. Patient did not answer the phone. Message was left on the patient's voicemail with call back number [phone].  Janus Mercury, PA-C [employer] Pager [phone]

## 2023-12-01 ENCOUNTER — Encounter (HOSPITAL_COMMUNITY): Payer: Self-pay

## 2023-12-01 ENCOUNTER — Inpatient Hospital Stay: Payer: Self-pay | Admitting: Student

## 2023-12-01 ENCOUNTER — Other Ambulatory Visit (HOSPITAL_COMMUNITY): Payer: Self-pay

## 2023-12-01 NOTE — Progress Notes (Deleted)
 Established Patient Office Visit  Subjective   Patient ID: Kathy Frank, female    DOB: 09-17-1975  Age: 48 y.o. MRN: [id number]  No chief complaint on file.   HPI This is a 48 year old female living with a history stated below and presents today for HFU. Please see problem based assessment and plan for additional details.   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   Anemia, posthemorrhagic, acute 10/14/2022   Anxiety    CAD (coronary artery disease)    CAP (community acquired pneumonia) 09/29/2022   CVA (cerebral vascular accident) [facility])    Depression 06/28/2006   Qualifier: Diagnosis of  By: Gregery Leander MD, Todd     Diabetes mellitus type 2 in obese 06/28/1994   Dyspnea    uses oxygeb 2 liters per minute at dialysis   End stage renal disease on dialysis [facility])    on hemodialysis T/Th/Sat   Erosive esophagitis    Esophageal reflux    Eye redness    Fecal incontinence 02/04/2018   Gastroparesis    ? diabetic   GERD (gastroesophageal reflux disease)    Human immunodeficiency virus (HIV) disease (HCC) 04/23/2016   Hyperkalemia 10/12/2022   Hyperlipidemia    Hypertension    Hypertensive emergency 11/09/2021   Hypertensive urgency 05/20/2021   Metabolic bone disease 05/02/2019   Moderate nonproliferative diabetic retinopathy of both eyes (HCC) 11/21/2014   11/14/14: Noted on retinal imaging; needs follow-up imaging in 6 months  05/22/16: Noted on retinal imaging again; needs follow-up imaging in 6 months   PAD (peripheral artery disease) (HCC)    Type 2 diabetes mellitus with diabetic peripheral angiopathy without gangrene (HCC) 05/01/2019   Wound infection s/p L transmetatarsal amputation    ROS   AS per assessment and plan  Objective:     There were no vitals taken for this visit. BP Readings from Last 3 Encounters:  11/29/23 126/69  05/21/23 (!) 151/77  05/18/23 (!) 207/95    Wt Readings from Last 3 Encounters:  11/29/23 167 lb 8.8 oz (76 kg)  05/21/23 135 lb (61.2 kg)  05/18/23 135 lb (61.2 kg)   SpO2 Readings from Last 3 Encounters:  11/29/23 100%  05/18/23 97%  05/10/23 100%      Physical Exam  General: Sitting in chair, no acute distress Cardiovascular: Regular rate, no murmurs appreciated Pulmonary: Breathing comfortably, no wheezing or crackles Abdomen: Soft, nontender, nondistended, bowel sounds present MSK:   No results found for any visits on 12/01/23.  Last CBC Lab Results  Component Value Date   WBC 5.4 11/29/2023   HGB 10.4 (L) 11/29/2023   HCT 32.4 (L) 11/29/2023   MCV 92.3 11/29/2023   MCH 29.6 11/29/2023   RDW 15.3 11/29/2023   PLT 321 11/29/2023   Last metabolic panel Lab Results  Component Value Date   GLUCOSE 193 (H) 11/29/2023   NA 128 (L) 11/29/2023   K 4.4 11/29/2023   CL 86 (L) 11/29/2023   CO2 22 11/29/2023   BUN 54 (H) 11/29/2023   CREATININE 10.90 (H) 11/29/2023   GFRNONAA 4 (L) 11/29/2023   CALCIUM  9.1 11/29/2023   PHOS 7.6 (H) 11/29/2023   PROT 9.0 (H) 11/27/2023   ALBUMIN 2.9 (L) 11/29/2023   LABGLOB 3.9 04/23/2016   AGRATIO 0.9 (L) 04/23/2016   BILITOT 0.6 11/27/2023   ALKPHOS 52 11/27/2023   AST 12 (L) 11/27/2023   ALT  7 11/27/2023   ANIONGAP 20 (H) 11/29/2023     Lab Results  Component Value Date   HGBA1C 8.9 (H) 11/27/2023     The ASCVD Risk score (Arnett DK, et al., 2019) failed to calculate for the following reasons:   Risk score cannot be calculated because patient has a medical history suggesting prior/existing ASCVD    Assessment & Plan:   Admit date:     11/27/2023  Discharge date: 11/29/23  Admitting dx: ESRD on HD and hypervolemia   ESRD on  Last HD session on   ;  Per exam:   - Continue calcitriol  and calcium  acetate  - Follow up with Nephrology  Acute HFpEF  ECHO 4/20 LVEF 60-65% with normal LV function, no wall motion abnormalities, G1DD.  - Coreg  12.5 mg BID  and amlodipine -olmesartan  10-40 mg daily  HTN BP Readings from Last 3 Encounters:  11/29/23 126/69  05/21/23 (!) 151/77  05/18/23 (!) 207/95   OP medication regimen amlodipine -olmesartan  10-40 mg daily.   T2DM Last A1c 8.9 on 11/27/2023 with hyperglycemia noted during hospitalization. OP medication regimen Lantus  15 units and Victoza  0.6 mg daily   HIV Continue Biktarvy  daily   Depression  Continue Buspirone  2.5 mg BID, Lexipro 5 mg daily, Requip  0.25 mg daily   Migraine with aura  Continue Ubrelvy  50 mg as needed for migraine headaches     Problem List Items Addressed This Visit   None   No follow-ups on file.    Lanney Pitts, DO

## 2023-12-01 NOTE — Transitions of Care (Post Inpatient/ED Visit) (Unsigned)
   12/01/2023  Name: Kathy Frank MRN: [id number] DOB: 1976-04-27  Today's TOC FU Call Status: Today's TOC FU Call Status:: Unsuccessful Call (2nd Attempt) Unsuccessful Call (1st Attempt) Date: 11/30/23 Unsuccessful Call (2nd Attempt) Date: 12/01/23  Attempted to reach the patient regarding the most recent Inpatient/ED visit.  Follow Up Plan: Additional outreach attempts will be made to reach the patient to complete the Transitions of Care (Post Inpatient/ED visit) call.   Signature Darrall Ellison, LPN [facility] Nurse Health Advisor Direct Dial  [phone]

## 2023-12-02 ENCOUNTER — Inpatient Hospital Stay: Payer: Self-pay | Admitting: Student

## 2023-12-02 ENCOUNTER — Encounter (HOSPITAL_COMMUNITY): Payer: Self-pay

## 2023-12-02 DIAGNOSIS — L299 Pruritus, unspecified: Secondary | ICD-10-CM | POA: Diagnosis not present

## 2023-12-02 DIAGNOSIS — N2581 Secondary hyperparathyroidism of renal origin: Secondary | ICD-10-CM | POA: Diagnosis not present

## 2023-12-02 DIAGNOSIS — R197 Diarrhea, unspecified: Secondary | ICD-10-CM | POA: Diagnosis not present

## 2023-12-02 DIAGNOSIS — I5023 Acute on chronic systolic (congestive) heart failure: Secondary | ICD-10-CM | POA: Diagnosis not present

## 2023-12-02 DIAGNOSIS — N186 End stage renal disease: Secondary | ICD-10-CM | POA: Diagnosis not present

## 2023-12-02 DIAGNOSIS — R519 Headache, unspecified: Secondary | ICD-10-CM | POA: Diagnosis not present

## 2023-12-02 DIAGNOSIS — D631 Anemia in chronic kidney disease: Secondary | ICD-10-CM | POA: Diagnosis not present

## 2023-12-02 DIAGNOSIS — E1110 Type 2 diabetes mellitus with ketoacidosis without coma: Secondary | ICD-10-CM | POA: Diagnosis not present

## 2023-12-02 DIAGNOSIS — E875 Hyperkalemia: Secondary | ICD-10-CM | POA: Diagnosis not present

## 2023-12-02 DIAGNOSIS — Z992 Dependence on renal dialysis: Secondary | ICD-10-CM | POA: Diagnosis not present

## 2023-12-02 NOTE — Transitions of Care (Post Inpatient/ED Visit) (Signed)
   12/02/2023  Name: Kathy Frank MRN: [id number] DOB: Nov 13, 1975  Today's TOC FU Call Status: Today's TOC FU Call Status:: Unsuccessful Call (2nd Attempt) Unsuccessful Call (1st Attempt) Date: 11/30/23 Unsuccessful Call (2nd Attempt) Date: 12/01/23  Attempted to reach the patient regarding the most recent Inpatient/ED visit.  Follow Up Plan: No further outreach attempts will be made at this time. We have been unable to contact the patient. Patient already seen in office Signature Darrall Ellison, LPN [facility] Nurse Health Advisor Direct Dial  [phone]

## 2023-12-03 ENCOUNTER — Other Ambulatory Visit: Payer: Self-pay | Admitting: Student

## 2023-12-03 NOTE — Telephone Encounter (Signed)
 Copied from CRM [id number]. Topic: Clinical - Medication Refill >> Dec 03, 2023  4:14 PM Eleanore Grey wrote: Most Recent Primary Care Visit:  Provider: Karalee Oscar  Department: [facility]  Visit Type: TELEPHONE OFFICE VISIT  Date: 06/11/2023  Medication: albuterol  (VENTOLIN  HFA) 108 (90 Base) MCG/ACT inhaler  Has the patient contacted their pharmacy? Case manager with [employer] is calling on patient's behalf, do not believe patient has contacted the pharmacy (Agent: If no, request that the patient contact the pharmacy for the refill. If patient does not wish to contact the pharmacy document the reason why and proceed with request.) (Agent: If yes, when and what did the pharmacy advise?)  Is this the correct pharmacy for this prescription? Yes If no, delete pharmacy and type the correct one.  This is the patient's preferred pharmacy:  [facility] [address]  [address] [place] Phone: [phone]    Fax: [phone]   Has the prescription been filled recently? No  Is the patient out of the medication? Yes  Has the patient been seen for an appointment in the last year OR does the patient have an upcoming appointment? Yes  Can we respond through MyChart? No  Agent: Please be advised that Rx refills may take up to 3 business days. We ask that you follow-up with your pharmacy.

## 2023-12-04 DIAGNOSIS — R519 Headache, unspecified: Secondary | ICD-10-CM | POA: Diagnosis not present

## 2023-12-04 DIAGNOSIS — R197 Diarrhea, unspecified: Secondary | ICD-10-CM | POA: Diagnosis not present

## 2023-12-04 DIAGNOSIS — L299 Pruritus, unspecified: Secondary | ICD-10-CM | POA: Diagnosis not present

## 2023-12-04 DIAGNOSIS — Z992 Dependence on renal dialysis: Secondary | ICD-10-CM | POA: Diagnosis not present

## 2023-12-04 DIAGNOSIS — N2581 Secondary hyperparathyroidism of renal origin: Secondary | ICD-10-CM | POA: Diagnosis not present

## 2023-12-04 DIAGNOSIS — D631 Anemia in chronic kidney disease: Secondary | ICD-10-CM | POA: Diagnosis not present

## 2023-12-04 DIAGNOSIS — E875 Hyperkalemia: Secondary | ICD-10-CM | POA: Diagnosis not present

## 2023-12-04 DIAGNOSIS — E1110 Type 2 diabetes mellitus with ketoacidosis without coma: Secondary | ICD-10-CM | POA: Diagnosis not present

## 2023-12-04 DIAGNOSIS — N186 End stage renal disease: Secondary | ICD-10-CM | POA: Diagnosis not present

## 2023-12-05 DIAGNOSIS — T879 Unspecified complications of amputation stump: Secondary | ICD-10-CM | POA: Diagnosis not present

## 2023-12-05 DIAGNOSIS — Z89612 Acquired absence of left leg above knee: Secondary | ICD-10-CM | POA: Diagnosis not present

## 2023-12-06 MED ORDER — ALBUTEROL SULFATE HFA 108 (90 BASE) MCG/ACT IN AERS: 2.0000 | INHALATION_SPRAY | RESPIRATORY_TRACT | 0 refills | Status: AC | PRN

## 2023-12-07 DIAGNOSIS — Z992 Dependence on renal dialysis: Secondary | ICD-10-CM | POA: Diagnosis not present

## 2023-12-07 DIAGNOSIS — L299 Pruritus, unspecified: Secondary | ICD-10-CM | POA: Diagnosis not present

## 2023-12-07 DIAGNOSIS — R519 Headache, unspecified: Secondary | ICD-10-CM | POA: Diagnosis not present

## 2023-12-07 DIAGNOSIS — N2581 Secondary hyperparathyroidism of renal origin: Secondary | ICD-10-CM | POA: Diagnosis not present

## 2023-12-07 DIAGNOSIS — R197 Diarrhea, unspecified: Secondary | ICD-10-CM | POA: Diagnosis not present

## 2023-12-07 DIAGNOSIS — D631 Anemia in chronic kidney disease: Secondary | ICD-10-CM | POA: Diagnosis not present

## 2023-12-07 DIAGNOSIS — N186 End stage renal disease: Secondary | ICD-10-CM | POA: Diagnosis not present

## 2023-12-07 DIAGNOSIS — E1110 Type 2 diabetes mellitus with ketoacidosis without coma: Secondary | ICD-10-CM | POA: Diagnosis not present

## 2023-12-07 DIAGNOSIS — E875 Hyperkalemia: Secondary | ICD-10-CM | POA: Diagnosis not present

## 2023-12-08 DIAGNOSIS — Z992 Dependence on renal dialysis: Secondary | ICD-10-CM | POA: Diagnosis not present

## 2023-12-08 DIAGNOSIS — N186 End stage renal disease: Secondary | ICD-10-CM | POA: Diagnosis not present

## 2023-12-08 DIAGNOSIS — E1129 Type 2 diabetes mellitus with other diabetic kidney complication: Secondary | ICD-10-CM | POA: Diagnosis not present

## 2023-12-09 ENCOUNTER — Telehealth: Payer: Self-pay | Admitting: *Deleted

## 2023-12-09 DIAGNOSIS — N186 End stage renal disease: Secondary | ICD-10-CM | POA: Diagnosis not present

## 2023-12-09 DIAGNOSIS — L299 Pruritus, unspecified: Secondary | ICD-10-CM | POA: Diagnosis not present

## 2023-12-09 DIAGNOSIS — E875 Hyperkalemia: Secondary | ICD-10-CM | POA: Diagnosis not present

## 2023-12-09 DIAGNOSIS — D631 Anemia in chronic kidney disease: Secondary | ICD-10-CM | POA: Diagnosis not present

## 2023-12-09 DIAGNOSIS — Z992 Dependence on renal dialysis: Secondary | ICD-10-CM | POA: Diagnosis not present

## 2023-12-09 DIAGNOSIS — N2581 Secondary hyperparathyroidism of renal origin: Secondary | ICD-10-CM | POA: Diagnosis not present

## 2023-12-09 DIAGNOSIS — I120 Hypertensive chronic kidney disease with stage 5 chronic kidney disease or end stage renal disease: Secondary | ICD-10-CM | POA: Diagnosis not present

## 2023-12-09 NOTE — Progress Notes (Signed)
 Complex Care Management Care Guide Note  12/09/2023 Name: MUNTAHA MARRANO MRN: [id number] DOB: April 27, 1976  Garnetta Justin Cecilio is a 48 y.o. year old female who is a primary care patient of Tawkaliyar, Roya, DO and is actively engaged with the care management team. I reached out to [name] by phone today to assist with re-scheduling  with the RN Case Manager.  Follow up plan: Unsuccessful telephone outreach attempt made.  Barnie Bora  [facility] Health  Value-Based Care Institute, [facility] Guide  Direct Dial : [phone]  Fax [phone]

## 2023-12-14 DIAGNOSIS — I120 Hypertensive chronic kidney disease with stage 5 chronic kidney disease or end stage renal disease: Secondary | ICD-10-CM | POA: Diagnosis not present

## 2023-12-14 DIAGNOSIS — Z992 Dependence on renal dialysis: Secondary | ICD-10-CM | POA: Diagnosis not present

## 2023-12-14 DIAGNOSIS — N186 End stage renal disease: Secondary | ICD-10-CM | POA: Diagnosis not present

## 2023-12-14 DIAGNOSIS — N2581 Secondary hyperparathyroidism of renal origin: Secondary | ICD-10-CM | POA: Diagnosis not present

## 2023-12-14 DIAGNOSIS — E875 Hyperkalemia: Secondary | ICD-10-CM | POA: Diagnosis not present

## 2023-12-14 DIAGNOSIS — L299 Pruritus, unspecified: Secondary | ICD-10-CM | POA: Diagnosis not present

## 2023-12-14 DIAGNOSIS — D631 Anemia in chronic kidney disease: Secondary | ICD-10-CM | POA: Diagnosis not present

## 2023-12-15 ENCOUNTER — Other Ambulatory Visit: Payer: Self-pay

## 2023-12-16 DIAGNOSIS — D631 Anemia in chronic kidney disease: Secondary | ICD-10-CM | POA: Diagnosis not present

## 2023-12-16 DIAGNOSIS — E875 Hyperkalemia: Secondary | ICD-10-CM | POA: Diagnosis not present

## 2023-12-16 DIAGNOSIS — L299 Pruritus, unspecified: Secondary | ICD-10-CM | POA: Diagnosis not present

## 2023-12-16 DIAGNOSIS — N186 End stage renal disease: Secondary | ICD-10-CM | POA: Diagnosis not present

## 2023-12-16 DIAGNOSIS — Z992 Dependence on renal dialysis: Secondary | ICD-10-CM | POA: Diagnosis not present

## 2023-12-16 DIAGNOSIS — I120 Hypertensive chronic kidney disease with stage 5 chronic kidney disease or end stage renal disease: Secondary | ICD-10-CM | POA: Diagnosis not present

## 2023-12-16 DIAGNOSIS — N2581 Secondary hyperparathyroidism of renal origin: Secondary | ICD-10-CM | POA: Diagnosis not present

## 2023-12-17 NOTE — Progress Notes (Signed)
 Complex Care Management Care Guide Note  12/17/2023 Name: Kathy Frank MRN: [id number] DOB: 07-12-1976  [name] is a 48 y.o. year old female who is a primary care patient of Tawkaliyar, Roya, DO and is actively engaged with the care management team. I reached out to [name] by phone today to assist with re-scheduling  with the RN Case Manager.  Follow up plan: Unsuccessful telephone outreach attempt made. No further outreach attempts will be made at this time. We have been unable to contact the patient to reschedule for complex care management services.   Barnie Bora  [facility] Health  Value-Based Care Institute, [facility] Guide  Direct Dial : [phone]  Fax [phone]

## 2023-12-18 DIAGNOSIS — E875 Hyperkalemia: Secondary | ICD-10-CM | POA: Diagnosis not present

## 2023-12-18 DIAGNOSIS — N2581 Secondary hyperparathyroidism of renal origin: Secondary | ICD-10-CM | POA: Diagnosis not present

## 2023-12-18 DIAGNOSIS — D631 Anemia in chronic kidney disease: Secondary | ICD-10-CM | POA: Diagnosis not present

## 2023-12-18 DIAGNOSIS — L299 Pruritus, unspecified: Secondary | ICD-10-CM | POA: Diagnosis not present

## 2023-12-18 DIAGNOSIS — N186 End stage renal disease: Secondary | ICD-10-CM | POA: Diagnosis not present

## 2023-12-18 DIAGNOSIS — Z992 Dependence on renal dialysis: Secondary | ICD-10-CM | POA: Diagnosis not present

## 2023-12-18 DIAGNOSIS — I120 Hypertensive chronic kidney disease with stage 5 chronic kidney disease or end stage renal disease: Secondary | ICD-10-CM | POA: Diagnosis not present

## 2023-12-20 ENCOUNTER — Other Ambulatory Visit: Payer: Self-pay | Admitting: Infectious Diseases

## 2023-12-20 ENCOUNTER — Other Ambulatory Visit (HOSPITAL_COMMUNITY): Payer: Self-pay

## 2023-12-20 ENCOUNTER — Other Ambulatory Visit: Payer: Self-pay

## 2023-12-20 DIAGNOSIS — B20 Human immunodeficiency virus [HIV] disease: Secondary | ICD-10-CM

## 2023-12-20 NOTE — Progress Notes (Signed)
 Specialty Pharmacy Refill Coordination Note  Kathy Frank is a 48 y.o. female contacted today regarding refills of specialty medication(s) Bictegravir-Emtricitab-Tenofov (Biktarvy )   Patient requested Delivery   Delivery date: 12/24/23   Verified address: [address]   [place] [id number]   Medication will be filled on 12/23/23, pending refill approval.

## 2023-12-20 NOTE — Telephone Encounter (Signed)
Please have patient call to schedule an appointment.

## 2023-12-21 DIAGNOSIS — N186 End stage renal disease: Secondary | ICD-10-CM | POA: Diagnosis not present

## 2023-12-21 DIAGNOSIS — D631 Anemia in chronic kidney disease: Secondary | ICD-10-CM | POA: Diagnosis not present

## 2023-12-21 DIAGNOSIS — I120 Hypertensive chronic kidney disease with stage 5 chronic kidney disease or end stage renal disease: Secondary | ICD-10-CM | POA: Diagnosis not present

## 2023-12-21 DIAGNOSIS — L299 Pruritus, unspecified: Secondary | ICD-10-CM | POA: Diagnosis not present

## 2023-12-21 DIAGNOSIS — Z992 Dependence on renal dialysis: Secondary | ICD-10-CM | POA: Diagnosis not present

## 2023-12-21 DIAGNOSIS — N2581 Secondary hyperparathyroidism of renal origin: Secondary | ICD-10-CM | POA: Diagnosis not present

## 2023-12-21 DIAGNOSIS — E875 Hyperkalemia: Secondary | ICD-10-CM | POA: Diagnosis not present

## 2023-12-23 ENCOUNTER — Other Ambulatory Visit (HOSPITAL_COMMUNITY): Payer: Self-pay

## 2023-12-24 ENCOUNTER — Other Ambulatory Visit: Payer: Self-pay

## 2023-12-24 ENCOUNTER — Other Ambulatory Visit (HOSPITAL_COMMUNITY): Payer: Self-pay

## 2023-12-24 DIAGNOSIS — N186 End stage renal disease: Secondary | ICD-10-CM | POA: Diagnosis not present

## 2023-12-24 DIAGNOSIS — D631 Anemia in chronic kidney disease: Secondary | ICD-10-CM | POA: Diagnosis not present

## 2023-12-24 DIAGNOSIS — Z992 Dependence on renal dialysis: Secondary | ICD-10-CM | POA: Diagnosis not present

## 2023-12-24 DIAGNOSIS — I120 Hypertensive chronic kidney disease with stage 5 chronic kidney disease or end stage renal disease: Secondary | ICD-10-CM | POA: Diagnosis not present

## 2023-12-24 DIAGNOSIS — E875 Hyperkalemia: Secondary | ICD-10-CM | POA: Diagnosis not present

## 2023-12-24 DIAGNOSIS — L299 Pruritus, unspecified: Secondary | ICD-10-CM | POA: Diagnosis not present

## 2023-12-24 DIAGNOSIS — N2581 Secondary hyperparathyroidism of renal origin: Secondary | ICD-10-CM | POA: Diagnosis not present

## 2023-12-25 DIAGNOSIS — E875 Hyperkalemia: Secondary | ICD-10-CM | POA: Diagnosis not present

## 2023-12-25 DIAGNOSIS — I120 Hypertensive chronic kidney disease with stage 5 chronic kidney disease or end stage renal disease: Secondary | ICD-10-CM | POA: Diagnosis not present

## 2023-12-25 DIAGNOSIS — N186 End stage renal disease: Secondary | ICD-10-CM | POA: Diagnosis not present

## 2023-12-25 DIAGNOSIS — Z992 Dependence on renal dialysis: Secondary | ICD-10-CM | POA: Diagnosis not present

## 2023-12-25 DIAGNOSIS — D631 Anemia in chronic kidney disease: Secondary | ICD-10-CM | POA: Diagnosis not present

## 2023-12-25 DIAGNOSIS — N2581 Secondary hyperparathyroidism of renal origin: Secondary | ICD-10-CM | POA: Diagnosis not present

## 2023-12-25 DIAGNOSIS — R2990 Unspecified symptoms and signs involving the nervous system: Secondary | ICD-10-CM | POA: Diagnosis not present

## 2023-12-25 DIAGNOSIS — L299 Pruritus, unspecified: Secondary | ICD-10-CM | POA: Diagnosis not present

## 2023-12-25 DIAGNOSIS — T879 Unspecified complications of amputation stump: Secondary | ICD-10-CM | POA: Diagnosis not present

## 2023-12-27 ENCOUNTER — Other Ambulatory Visit: Payer: Self-pay | Admitting: Student

## 2023-12-27 ENCOUNTER — Other Ambulatory Visit: Payer: Self-pay

## 2023-12-27 NOTE — Telephone Encounter (Signed)
 Medication sent to pharmacy

## 2023-12-28 DIAGNOSIS — Z992 Dependence on renal dialysis: Secondary | ICD-10-CM | POA: Diagnosis not present

## 2023-12-28 DIAGNOSIS — N2581 Secondary hyperparathyroidism of renal origin: Secondary | ICD-10-CM | POA: Diagnosis not present

## 2023-12-28 DIAGNOSIS — D631 Anemia in chronic kidney disease: Secondary | ICD-10-CM | POA: Diagnosis not present

## 2023-12-28 DIAGNOSIS — N186 End stage renal disease: Secondary | ICD-10-CM | POA: Diagnosis not present

## 2023-12-28 DIAGNOSIS — L299 Pruritus, unspecified: Secondary | ICD-10-CM | POA: Diagnosis not present

## 2023-12-28 DIAGNOSIS — I120 Hypertensive chronic kidney disease with stage 5 chronic kidney disease or end stage renal disease: Secondary | ICD-10-CM | POA: Diagnosis not present

## 2023-12-28 DIAGNOSIS — E875 Hyperkalemia: Secondary | ICD-10-CM | POA: Diagnosis not present

## 2023-12-29 ENCOUNTER — Ambulatory Visit: Payer: Self-pay

## 2023-12-29 ENCOUNTER — Encounter (HOSPITAL_COMMUNITY): Payer: Self-pay

## 2023-12-29 NOTE — Telephone Encounter (Signed)
 Copied from CRM [id number]. Topic: Clinical - Red Word Triage >> Dec 29, 2023 11:07 AM Tisa Forester wrote: Red Word that prompted transfer to Nurse Triage: worsening symptom , patient have anxiety , depression  Chief Complaint: anxiety and depression Symptoms: anxious, declined to answer triage questions Frequency: comes and goes Pertinent Negatives: Patient denies na Disposition: [] ED /[] Urgent Care (no appt availability in office) / [x] Appointment(In office/virtual)/ []  [facility]/ [] Home Care/ [] Refused Recommended Disposition /[] [facility]/ []  Follow-up with PCP Additional Notes: apt made per protocol; care advice given, denies questions; instructed to go to ER if becomes worse.   Reason for Disposition  Patient sounds very upset or troubled to the triager  Answer Assessment - Initial Assessment Questions 1. CONCERN: "Did anything happen that prompted you to call today?"      Anxiety and depression worsening 2. ANXIETY SYMPTOMS: "Can you describe how you (your loved one; patient) have been feeling?" (e.g., tense, restless, panicky, anxious, keyed up, overwhelmed, sense of impending doom).      Anxious, 3. ONSET: "How long have you been feeling this way?" (e.g., hours, days, weeks)     Declined to answer triage questions.  4. SEVERITY: "How would you rate the level of anxiety?" (e.g., 0 - 10; or mild, moderate, severe).      5. FUNCTIONAL IMPAIRMENT: "How have these feelings affected your ability to do daily activities?" "Have you had more difficulty than usual doing your normal daily activities?" (e.g., getting better, same, worse; self-care, school, work, interactions)      6. HISTORY: "Have you felt this way before?" "Have you ever been diagnosed with an anxiety problem in the past?" (e.g., generalized anxiety disorder, panic attacks, PTSD). If Yes, ask: "How was this problem treated?" (e.g., medicines, counseling, etc.)      7. RISK OF HARM - SUICIDAL IDEATION:  "Do you ever have thoughts of hurting or killing yourself?" If Yes, ask:  "Do you have these feelings now?" "Do you have a plan on how you would do this?"      8. TREATMENT:  "What has been done so far to treat this anxiety?" (e.g., medicines, relaxation strategies). "What has helped?"      9. TREATMENT - THERAPIST: "Do you have a counselor or therapist? Name?"      10. POTENTIAL TRIGGERS: "Do you drink caffeinated beverages (e.g., coffee, colas, teas), and how much daily?" "Do you drink alcohol  or use any drugs?" "Have you started any new medicines recently?"        11. PATIENT SUPPORT: "Who is with you now?" "Who do you live with?" "Do you have family or friends who you can talk to?"         12. OTHER SYMPTOMS: "Do you have any other symptoms?" (e.g., feeling depressed, trouble concentrating, trouble sleeping, trouble breathing, palpitations or fast heartbeat, chest pain, sweating, nausea, or diarrhea)        13. PREGNANCY: "Is there any chance you are pregnant?" "When was your last menstrual period?"  Protocols used: Anxiety and Panic Attack-A-AH

## 2023-12-29 NOTE — Telephone Encounter (Signed)
 Pt has an appt tomorrow 5/22 w/Dr Teodora Fell. I talked to pt who stated she's having a lot of anxiety. Has panic attacks when in public. Up all day and night sometimes. Stated medication is not working. Stated she has  anxiety/depression since amputation last year. Stated dialysis has set her up w/a psychotherapist in June. Denies SI but stated "it's on my mind". Then she stated her grand baby is very important to her. Stated she will be"ok" today; aware her appt is tomorrow and knows to go to ER if symptoms worsen.

## 2023-12-30 ENCOUNTER — Ambulatory Visit (INDEPENDENT_AMBULATORY_CARE_PROVIDER_SITE_OTHER): Admitting: Student

## 2023-12-30 VITALS — BP 151/77 | HR 86 | Temp 97.8°F | Ht 60.0 in

## 2023-12-30 DIAGNOSIS — I120 Hypertensive chronic kidney disease with stage 5 chronic kidney disease or end stage renal disease: Secondary | ICD-10-CM | POA: Diagnosis not present

## 2023-12-30 DIAGNOSIS — F419 Anxiety disorder, unspecified: Secondary | ICD-10-CM

## 2023-12-30 DIAGNOSIS — L299 Pruritus, unspecified: Secondary | ICD-10-CM | POA: Diagnosis not present

## 2023-12-30 DIAGNOSIS — D631 Anemia in chronic kidney disease: Secondary | ICD-10-CM | POA: Diagnosis not present

## 2023-12-30 DIAGNOSIS — E875 Hyperkalemia: Secondary | ICD-10-CM | POA: Diagnosis not present

## 2023-12-30 DIAGNOSIS — Z992 Dependence on renal dialysis: Secondary | ICD-10-CM | POA: Diagnosis not present

## 2023-12-30 DIAGNOSIS — N2581 Secondary hyperparathyroidism of renal origin: Secondary | ICD-10-CM | POA: Diagnosis not present

## 2023-12-30 DIAGNOSIS — N186 End stage renal disease: Secondary | ICD-10-CM | POA: Diagnosis not present

## 2023-12-30 DIAGNOSIS — F331 Major depressive disorder, recurrent, moderate: Secondary | ICD-10-CM

## 2023-12-30 DIAGNOSIS — F32A Depression, unspecified: Secondary | ICD-10-CM

## 2023-12-30 NOTE — Progress Notes (Signed)
 CC: Acute visit for anxiety and depression.  HPI:  [name] is a 48 y.o. female living with a history stated below and presents today for Acute visit for anxiety and depression.  Patient was last seen on 11/24.    Please see problem based assessment and plan for additional details.   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   Anemia, posthemorrhagic, acute 10/14/2022   Anxiety    CAD (coronary artery disease)    CAP (community acquired pneumonia) 09/29/2022   CVA (cerebral vascular accident) [facility])    Depression 06/28/2006   Qualifier: Diagnosis of  By: Gregery Leander MD, Todd     Diabetes mellitus type 2 in obese 06/28/1994   Dyspnea    uses oxygeb 2 liters per minute at dialysis   End stage renal disease on dialysis [facility])    on hemodialysis T/Th/Sat   Erosive esophagitis    Esophageal reflux    Eye redness    Fecal incontinence 02/04/2018   Gastroparesis    ? diabetic   GERD (gastroesophageal reflux disease)    Human immunodeficiency virus (HIV) disease (HCC) 04/23/2016   Hyperkalemia 10/12/2022   Hyperlipidemia    Hypertension    Hypertensive emergency 11/09/2021   Hypertensive urgency 05/20/2021   Metabolic bone disease 05/02/2019   Moderate nonproliferative diabetic retinopathy of both eyes (HCC) 11/21/2014   11/14/14: Noted on retinal imaging; needs follow-up imaging in 6 months  05/22/16: Noted on retinal imaging again; needs follow-up imaging in 6 months   PAD (peripheral artery disease) (HCC)    Type 2 diabetes mellitus with diabetic peripheral angiopathy without gangrene (HCC) 05/01/2019   Wound infection s/p L transmetatarsal amputation     Current Outpatient Medications on File Prior to Visit  Medication Sig Dispense Refill   Accu-Chek Softclix Lancets lancets Use as instructed 100 each 12   albuterol  (VENTOLIN  HFA) 108 (90 Base) MCG/ACT inhaler Inhale 2 puffs  into the lungs every 4 (four) hours as needed for wheezing or shortness of breath. 18 g 0   amLODipine -olmesartan  (AZOR ) 10-40 MG tablet Take 1 tablet by mouth daily. 90 tablet 3   aspirin  EC (EQ ASPIRIN  ADULT LOW DOSE) 81 MG tablet Take 1 tablet (81 mg total) by mouth daily. Swallow whole. 120 tablet 2   atorvastatin  (LIPITOR ) 80 MG tablet Take 1 tablet (80 mg total) by mouth daily. 30 tablet 11   bictegravir-emtricitabine -tenofovir  AF (BIKTARVY ) 50-200-25 MG TABS tablet Take 1 tablet by mouth daily. Try to take at the same time each day with or without food. 30 tablet 0   busPIRone  (BUSPAR ) 5 MG tablet Take 2.5 mg by mouth 2 (two) times daily. Take 1/2 tablet twice a day at 9am & 5pm.     calcitRIOL  (ROCALTROL ) 0.25 MCG capsule Take 5 capsules (1.25 mcg total) by mouth Every Tuesday,Thursday,and Saturday with dialysis. 60 capsule 0   calcium  acetate (PHOSLO ) 667 MG capsule Take 2 capsules (1,334 mg total) by mouth 3 (three) times daily with meals. 360 capsule 0   carvedilol  (COREG ) 12.5 MG tablet Take 1 tablet (12.5 mg total) by mouth 2 (two) times daily with a meal. 60 tablet 0   Continuous Blood Gluc Sensor (DEXCOM G7 SENSOR) MISC Use to check blood sugar continuously 10 each 6   cyclobenzaprine  (FLEXERIL ) 5 MG tablet Take 1 tablet (5 mg total) by mouth 2 (two) times daily as needed for muscle spasms. 30  tablet 1   glucose blood test strip Use as instructed 100 each 12   insulin  glargine (LANTUS  SOLOSTAR) 100 UNIT/ML Solostar Pen Inject 15 Units into the skin at bedtime. 6 mL 4   Insulin  Pen Needle (PENTIPS) 32G X 4 MM MISC Use once daily with insulin  and once daily with victoza  100 each 3   liraglutide  (VICTOZA ) 18 MG/3ML SOPN Inject 0.6 mg into the skin daily. 3 mL 11   LOKELMA  5 g packet Take 5 g (1 packet total) by mouth daily. 30 packet 0   multivitamin (RENA-VIT) TABS tablet Take 1 tablet by mouth daily. (Patient taking differently: Take 1 tablet by mouth See admin instructions. On Tuesday,  Wednesday and Thursday) 30 tablet 0   psyllium (HYDROCIL/METAMUCIL) 95 % PACK Take 1 packet by mouth daily. 60 each 0   rOPINIRole  (REQUIP ) 0.25 MG tablet Take 0.25 mg by mouth at bedtime.     UBRELVY  50 MG TABS TAKE ONE TABLET (50 MG TOTAL) BY MOUTH AS NEEDED FOR MIGRAINE. IF NEEDED A SECOND DOSE MAY BE TAKEN AT LEAST 2 HOURS AFTER THE INITIAL DOSE. MAX 8 DOSES IN A MONTH 10 tablet 11   No current facility-administered medications on file prior to visit.    Review of Systems: ROS negative except for what is noted on the assessment and plan.  Vitals:   12/30/23 1319 12/30/23 1348  BP: (!) 158/71 (!) 151/77  Pulse: 87 86  Temp: 97.8 F (36.6 C)   TempSrc: Oral   SpO2: 100%   Height: 5' (1.524 m)    Physical Exam: Constitutional: NAD Cardiovascular: regular rate and rhythm, no m/r/g Pulmonary/Chest: normal work of breathing on room air, lungs clear to auscultation bilaterally Psych: Flat affect, mood congruent.  Thought process linear, no flight of ideas or tangentiality.  No hallucinations or delusions. Abdominal: soft, non-tender, non-distended  Assessment & Plan:   Patient discussed with Dr. Jarvis Mesa  Depression Prior history of depression, presenting with worsening low mood, fatigue, difficulty concentrating.  Denies active SI/HI.  Denies any recent personal stressors. She is on escitalopram  5 mg, endorses compliance.  PHQ 9 score is 21.  She has scheduled a therapist follow-up on June 4, advised patient to keep that appointment.   Provided suicide and crisis Lifeline, available 24 hours; just need to call [phone]. Educated patient about the side effect of Lexapro  including GI distress.  Plan: - Will increase escitalopram  to 10 mg. - Follow-up with psychotherapy as planned. - Follow-up in clinic 6-8 weeks.  Anxiety GAD-7 score is 15.  No clear new stresses. Plan: - Will increase escitalopram  to 10 mg as above. - Follow-up in 6 to 8 weeks.   Marni Sins, MD [facility]  Internal Medicine, PGY-1 Pager: [phone] Date 12/31/2023 Time 4:43 PM

## 2023-12-30 NOTE — Patient Instructions (Signed)
 It was a pleasure taking care of you today!    I will increase your dose of Lexapro  to 10 mg.  Please pick up this medicine from the pharmacy.  2.  I will strongly encourage you to keep your appointment with your therapist on June 4.  3.  Follow-up in 6 to 8 weeks  I have ordered the following labs for you:  Lab Orders  No laboratory test(s) ordered today      Follow up: 6-8 weeks   Should you have any questions or concerns please call the internal medicine clinic at [phone].     Marni Sins, MD  [facility]

## 2023-12-31 MED ORDER — ESCITALOPRAM OXALATE 10 MG PO TABS: 10.0000 mg | ORAL_TABLET | Freq: Every day | ORAL | 3 refills | Status: DC

## 2023-12-31 NOTE — Assessment & Plan Note (Signed)
 GAD-7 score is 15.  No clear new stresses. Plan: - Will increase escitalopram  to 10 mg as above. - Follow-up in 6 to 8 weeks.

## 2023-12-31 NOTE — Assessment & Plan Note (Addendum)
 Prior history of depression, presenting with worsening low mood, fatigue, difficulty concentrating.  Denies active SI/HI.  Denies any recent personal stressors. She is on escitalopram  5 mg, endorses compliance.  PHQ 9 score is 21.  She has scheduled a therapist follow-up on June 4, advised patient to keep that appointment.   Provided suicide and crisis Lifeline, available 24 hours; just need to call [phone]. Educated patient about the side effect of Lexapro  including GI distress.  Plan: - Will increase escitalopram  to 10 mg. - Follow-up with psychotherapy as planned. - Follow-up in clinic 6-8 weeks.

## 2024-01-01 DIAGNOSIS — N186 End stage renal disease: Secondary | ICD-10-CM | POA: Diagnosis not present

## 2024-01-01 DIAGNOSIS — D631 Anemia in chronic kidney disease: Secondary | ICD-10-CM | POA: Diagnosis not present

## 2024-01-01 DIAGNOSIS — E875 Hyperkalemia: Secondary | ICD-10-CM | POA: Diagnosis not present

## 2024-01-01 DIAGNOSIS — N2581 Secondary hyperparathyroidism of renal origin: Secondary | ICD-10-CM | POA: Diagnosis not present

## 2024-01-01 DIAGNOSIS — Z992 Dependence on renal dialysis: Secondary | ICD-10-CM | POA: Diagnosis not present

## 2024-01-01 DIAGNOSIS — L299 Pruritus, unspecified: Secondary | ICD-10-CM | POA: Diagnosis not present

## 2024-01-01 DIAGNOSIS — I120 Hypertensive chronic kidney disease with stage 5 chronic kidney disease or end stage renal disease: Secondary | ICD-10-CM | POA: Diagnosis not present

## 2024-01-02 ENCOUNTER — Encounter (HOSPITAL_COMMUNITY): Payer: Self-pay

## 2024-01-04 DIAGNOSIS — Z89612 Acquired absence of left leg above knee: Secondary | ICD-10-CM | POA: Diagnosis not present

## 2024-01-04 DIAGNOSIS — N186 End stage renal disease: Secondary | ICD-10-CM | POA: Diagnosis not present

## 2024-01-04 DIAGNOSIS — E875 Hyperkalemia: Secondary | ICD-10-CM | POA: Diagnosis not present

## 2024-01-04 DIAGNOSIS — N2581 Secondary hyperparathyroidism of renal origin: Secondary | ICD-10-CM | POA: Diagnosis not present

## 2024-01-04 DIAGNOSIS — T879 Unspecified complications of amputation stump: Secondary | ICD-10-CM | POA: Diagnosis not present

## 2024-01-04 DIAGNOSIS — D631 Anemia in chronic kidney disease: Secondary | ICD-10-CM | POA: Diagnosis not present

## 2024-01-04 DIAGNOSIS — L299 Pruritus, unspecified: Secondary | ICD-10-CM | POA: Diagnosis not present

## 2024-01-04 DIAGNOSIS — Z992 Dependence on renal dialysis: Secondary | ICD-10-CM | POA: Diagnosis not present

## 2024-01-04 DIAGNOSIS — I120 Hypertensive chronic kidney disease with stage 5 chronic kidney disease or end stage renal disease: Secondary | ICD-10-CM | POA: Diagnosis not present

## 2024-01-06 DIAGNOSIS — L299 Pruritus, unspecified: Secondary | ICD-10-CM | POA: Diagnosis not present

## 2024-01-06 DIAGNOSIS — N2581 Secondary hyperparathyroidism of renal origin: Secondary | ICD-10-CM | POA: Diagnosis not present

## 2024-01-06 DIAGNOSIS — Z992 Dependence on renal dialysis: Secondary | ICD-10-CM | POA: Diagnosis not present

## 2024-01-06 DIAGNOSIS — I120 Hypertensive chronic kidney disease with stage 5 chronic kidney disease or end stage renal disease: Secondary | ICD-10-CM | POA: Diagnosis not present

## 2024-01-06 DIAGNOSIS — N186 End stage renal disease: Secondary | ICD-10-CM | POA: Diagnosis not present

## 2024-01-06 DIAGNOSIS — D631 Anemia in chronic kidney disease: Secondary | ICD-10-CM | POA: Diagnosis not present

## 2024-01-06 DIAGNOSIS — E875 Hyperkalemia: Secondary | ICD-10-CM | POA: Diagnosis not present

## 2024-01-07 NOTE — Progress Notes (Signed)
 Internal Medicine Clinic Attending  Case discussed with the resident at the time of the visit.  We reviewed the resident's history and exam and pertinent patient test results.  I agree with the assessment, diagnosis, and plan of care documented in the resident's note.

## 2024-01-08 DIAGNOSIS — Z992 Dependence on renal dialysis: Secondary | ICD-10-CM | POA: Diagnosis not present

## 2024-01-08 DIAGNOSIS — N186 End stage renal disease: Secondary | ICD-10-CM | POA: Diagnosis not present

## 2024-01-08 DIAGNOSIS — E1129 Type 2 diabetes mellitus with other diabetic kidney complication: Secondary | ICD-10-CM | POA: Diagnosis not present

## 2024-01-11 DIAGNOSIS — Z992 Dependence on renal dialysis: Secondary | ICD-10-CM | POA: Diagnosis not present

## 2024-01-11 DIAGNOSIS — I120 Hypertensive chronic kidney disease with stage 5 chronic kidney disease or end stage renal disease: Secondary | ICD-10-CM | POA: Diagnosis not present

## 2024-01-11 DIAGNOSIS — D631 Anemia in chronic kidney disease: Secondary | ICD-10-CM | POA: Diagnosis not present

## 2024-01-11 DIAGNOSIS — N2581 Secondary hyperparathyroidism of renal origin: Secondary | ICD-10-CM | POA: Diagnosis not present

## 2024-01-11 DIAGNOSIS — N186 End stage renal disease: Secondary | ICD-10-CM | POA: Diagnosis not present

## 2024-01-11 DIAGNOSIS — E1129 Type 2 diabetes mellitus with other diabetic kidney complication: Secondary | ICD-10-CM | POA: Diagnosis not present

## 2024-01-11 DIAGNOSIS — E875 Hyperkalemia: Secondary | ICD-10-CM | POA: Diagnosis not present

## 2024-01-11 DIAGNOSIS — R197 Diarrhea, unspecified: Secondary | ICD-10-CM | POA: Diagnosis not present

## 2024-01-12 ENCOUNTER — Ambulatory Visit

## 2024-01-12 ENCOUNTER — Encounter (HOSPITAL_COMMUNITY): Payer: Self-pay

## 2024-01-12 VITALS — Ht 60.0 in

## 2024-01-12 DIAGNOSIS — Z0000 Encounter for general adult medical examination without abnormal findings: Secondary | ICD-10-CM | POA: Diagnosis not present

## 2024-01-12 NOTE — Patient Instructions (Signed)
 Kathy Frank , Thank you for taking time out of your busy schedule to complete your Annual Wellness Visit with me. I enjoyed our conversation and look forward to speaking with you again next year. I, as well as your care team,  appreciate your ongoing commitment to your health goals. Please review the following plan we discussed and let me know if I can assist you in the future. Your Game plan/ To Do List    Referrals: If you haven't heard from the office you've been referred to, please reach out to them at the phone provided.   Follow up Visits: Next Medicare AWV with our clinical staff: 01/17/2025 at 1:00 pm Phone Visit with Nurse Health Advisor   Have you seen your provider in the last 6 months (3 months if uncontrolled diabetes)? Yes Next Office Visit with your provider: Please call office to schedule next appointment  Clinician Recommendations:  Aim for 30 minutes of exercise or brisk walking, 6-8 glasses of water , and 5 servings of fruits and vegetables each day.       This is a list of the screening recommended for you and due dates:  Health Maintenance  Topic Date Due   Eye exam for diabetics  05/22/2017   Pneumococcal Vaccination (3 of 3 - PPSV23, PCV20 or PCV21) 11/17/2018   COVID-19 Vaccine (2 - Moderna risk series) 11/28/2019   Colon Cancer Screening  Never done   Pap with HPV screening  02/05/2023   Medicare Annual Wellness Visit  10/19/2023   Hemoglobin A1C  02/26/2024   Flu Shot  03/10/2024   Lipid (cholesterol) test  04/26/2024   DTaP/Tdap/Td vaccine (2 - Td or Tdap) 09/18/2027   Hepatitis C Screening  Completed   HIV Screening  Completed   HPV Vaccine  Aged Out   Meningitis B Vaccine  Aged Out   Complete foot exam   Discontinued    Advanced directives: (Declined) Advance directive discussed with you today. Even though you declined this today, please call our office should you change your mind, and we can give you the proper paperwork for you to fill out. Advance Care  Planning is important because it:  [x]  Makes sure you receive the medical care that is consistent with your values, goals, and preferences  [x]  It provides guidance to your family and loved ones and reduces their decisional burden about whether or not they are making the right decisions based on your wishes.  Follow the link provided in your after visit summary or read over the paperwork we have mailed to you to help you started getting your Advance Directives in place. If you need assistance in completing these, please reach out to us  so that we can help you!  See attachments for Preventive Care and Fall Prevention Tips.

## 2024-01-12 NOTE — Progress Notes (Signed)
 Because this visit was a virtual/telehealth visit,  certain criteria was not obtained, such a blood pressure, CBG if applicable, and timed get up and go. Any medications not marked as "taking" were not mentioned during the medication reconciliation part of the visit. Any vitals not documented were not able to be obtained due to this being a telehealth visit or patient was unable to self-report a recent blood pressure reading due to a lack of equipment at home via telehealth. Vitals that have been documented are verbally provided by the patient.   Subjective:   Kathy Frank is a 48 y.o. who presents for a Medicare Wellness preventive visit.  As a reminder, Annual Wellness Visits don't include a physical exam, and some assessments may be limited, especially if this visit is performed virtually. We may recommend an in-person follow-up visit with your provider if needed.  Visit Complete: Virtual I connected with  Kathy Frank on 01/12/24 by a audio enabled telemedicine application and verified that I am speaking with the correct person using two identifiers.  Patient Location: Home  Provider Location: Office/Clinic  I discussed the limitations of evaluation and management by telemedicine. The patient expressed understanding and agreed to proceed.  Vital Signs: Because this visit was a virtual/telehealth visit, some criteria may be missing or patient reported. Any vitals not documented were not able to be obtained and vitals that have been documented are patient reported.  VideoDeclined- This patient declined Librarian, academic. Therefore the visit was completed with audio only.  Persons Participating in Visit: Patient.  AWV Questionnaire: No: Patient Medicare AWV questionnaire was not completed prior to this visit.  Cardiac Risk Factors include: diabetes mellitus;hypertension;sedentary lifestyle     Objective:     Today's Vitals   01/12/24 1303  Height:  5' (1.524 m)  PainSc: 0-No pain   Body mass index is 32.72 kg/m.     01/12/2024    1:21 PM 11/27/2023   11:54 PM 11/27/2023    9:42 AM 05/18/2023   12:22 PM 05/10/2023    9:57 AM 04/29/2023    9:00 PM 04/21/2023    8:01 AM  Advanced Directives  Does Patient Have a Medical Advance Directive? No  No No No No No  Would patient like information on creating a medical advance directive? No - Patient declined No - Patient declined   No - Patient declined No - Patient declined No - Patient declined    Current Medications (verified) Outpatient Encounter Medications as of 01/12/2024  Medication Sig   Accu-Chek Softclix Lancets lancets Use as instructed   albuterol  (VENTOLIN  HFA) 108 (90 Base) MCG/ACT inhaler Inhale 2 puffs into the lungs every 4 (four) hours as needed for wheezing or shortness of breath.   amLODipine -olmesartan  (AZOR ) 10-40 MG tablet Take 1 tablet by mouth daily.   aspirin  EC (EQ ASPIRIN  ADULT LOW DOSE) 81 MG tablet Take 1 tablet (81 mg total) by mouth daily. Swallow whole.   atorvastatin  (LIPITOR ) 80 MG tablet Take 1 tablet (80 mg total) by mouth daily.   bictegravir-emtricitabine -tenofovir  AF (BIKTARVY ) 50-200-25 MG TABS tablet Take 1 tablet by mouth daily. Try to take at the same time each day with or without food.   busPIRone  (BUSPAR ) 5 MG tablet Take 2.5 mg by mouth 2 (two) times daily. Take 1/2 tablet twice a day at 9am & 5pm.   calcitRIOL  (ROCALTROL ) 0.25 MCG capsule Take 5 capsules (1.25 mcg total) by mouth Every Tuesday,Thursday,and Saturday with dialysis.  calcium  acetate (PHOSLO ) 667 MG capsule Take 2 capsules (1,334 mg total) by mouth 3 (three) times daily with meals.   carvedilol  (COREG ) 12.5 MG tablet Take 1 tablet (12.5 mg total) by mouth 2 (two) times daily with a meal.   Continuous Blood Gluc Sensor (DEXCOM G7 SENSOR) MISC Use to check blood sugar continuously   cyclobenzaprine  (FLEXERIL ) 5 MG tablet Take 1 tablet (5 mg total) by mouth 2 (two) times daily as needed  for muscle spasms.   escitalopram  (LEXAPRO ) 10 MG tablet Take 1 tablet (10 mg total) by mouth daily.   glucose blood test strip Use as instructed   insulin  glargine (LANTUS  SOLOSTAR) 100 UNIT/ML Solostar Pen Inject 15 Units into the skin at bedtime.   Insulin  Pen Needle (PENTIPS) 32G X 4 MM MISC Use once daily with insulin  and once daily with victoza    liraglutide  (VICTOZA ) 18 MG/3ML SOPN Inject 0.6 mg into the skin daily.   LOKELMA  5 g packet Take 5 g (1 packet total) by mouth daily.   multivitamin (RENA-VIT) TABS tablet Take 1 tablet by mouth daily. (Patient taking differently: Take 1 tablet by mouth See admin instructions. On Tuesday, Wednesday and Thursday)   psyllium (HYDROCIL/METAMUCIL) 95 % PACK Take 1 packet by mouth daily.   rOPINIRole  (REQUIP ) 0.25 MG tablet Take 0.25 mg by mouth at bedtime.   UBRELVY  50 MG TABS TAKE ONE TABLET (50 MG TOTAL) BY MOUTH AS NEEDED FOR MIGRAINE. IF NEEDED A SECOND DOSE MAY BE TAKEN AT LEAST 2 HOURS AFTER THE INITIAL DOSE. MAX 8 DOSES IN A MONTH   No facility-administered encounter medications on file as of 01/12/2024.    Allergies (verified) Patient has no known allergies.   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   Anemia, posthemorrhagic, acute 10/14/2022   Anxiety    CAD (coronary artery disease)    CAP (community acquired pneumonia) 09/29/2022   CVA (cerebral vascular accident) [facility])    Depression 06/28/2006   Qualifier: Diagnosis of  By: Gregery Leander MD, Kathy Frank     Diabetes mellitus type 2 in obese 06/28/1994   Dyspnea    uses oxygeb 2 liters per minute at dialysis   End stage renal disease on dialysis [facility])    on hemodialysis T/Th/Sat   Erosive esophagitis    Esophageal reflux    Eye redness    Fecal incontinence 02/04/2018   Gastroparesis    ? diabetic   GERD (gastroesophageal reflux disease)    Human immunodeficiency virus (HIV) disease  (HCC) 04/23/2016   Hyperkalemia 10/12/2022   Hyperlipidemia    Hypertension    Hypertensive emergency 11/09/2021   Hypertensive urgency 05/20/2021   Metabolic bone disease 05/02/2019   Moderate nonproliferative diabetic retinopathy of both eyes (HCC) 11/21/2014   11/14/14: Noted on retinal imaging; needs follow-up imaging in 6 months  05/22/16: Noted on retinal imaging again; needs follow-up imaging in 6 months   PAD (peripheral artery disease) (HCC)    Type 2 diabetes mellitus with diabetic peripheral angiopathy without gangrene (HCC) 05/01/2019   Wound infection s/p L transmetatarsal amputation    Past Surgical History:  Procedure Laterality Date   A/V SHUNTOGRAM Left 04/21/2023   Procedure: A/V SHUNTOGRAM;  Surgeon: Kayla Part, MD;  Location: [facility] INVASIVE CV LAB;  Service: Cardiovascular;  Laterality: Left;   ABDOMINAL AORTOGRAM W/LOWER EXTREMITY N/A 12/13/2020   Procedure: ABDOMINAL AORTOGRAM W/LOWER EXTREMITY;  Surgeon: Dannis Dy,  MD;  Location: [facility] INVASIVE CV LAB;  Service: Cardiovascular;  Laterality: N/A;   AMPUTATION Left 07/08/2018   Procedure: AMPUTATION FORTH RAY LEFT FOOT;  Surgeon: Timothy Ford, MD;  Location: [facility] OR;  Service: Orthopedics;  Laterality: Left;   AMPUTATION Left 08/09/2018   Procedure: Left Transmetatarsal Amputation;  Surgeon: Timothy Ford, MD;  Location: [facility] OR;  Service: Orthopedics;  Laterality: Left;   AMPUTATION Left 10/08/2018   Procedure: LEFT BELOW KNEE AMPUTATION;  Surgeon: Timothy Ford, MD;  Location: [facility] OR;  Service: Orthopedics;  Laterality: Left;   AMPUTATION Left 10/28/2018   Procedure: REVISION BELOW KNEE AMPUTATION;  Surgeon: Timothy Ford, MD;  Location: [facility] OR;  Service: Orthopedics;  Laterality: Left;   AMPUTATION Left 12/16/2018   Procedure: LEFT ABOVE KNEE AMPUTATION;  Surgeon: Timothy Ford, MD;  Location: [facility] OR;  Service: Orthopedics;  Laterality: Left;   AMPUTATION Right 01/31/2021   Procedure: AMPUTATION BELOW KNEE;   Surgeon: Margherita Shell, MD;  Location: [facility] OR;  Service: Vascular;  Laterality: Right;   AMPUTATION Right 03/28/2021   Procedure: REVISION OF RIGHT BELOW KNEE AMPUTATION;  Surgeon: Margherita Shell, MD;  Location: [facility] OR;  Service: Vascular;  Laterality: Right;   AMPUTATION Right 04/18/2021   Procedure: right below knee amputation/washout placement wound vac;  Surgeon: Carlene Che, MD;  Location: [facility] OR;  Service: Vascular;  Laterality: Right;   AMPUTATION Right 02/19/2023   Procedure: CONVERSION TO ABOVE KNEE AMPUTATION;  Surgeon: Adine Hoof, MD;  Location: [facility] OR;  Service: Vascular;  Laterality: Right;   APPLICATION OF WOUND VAC Left 12/31/2022   Procedure: APPLICATION OF PREVENA WOUND VAC;  Surgeon: Kayla Part, MD;  Location: [facility] OR;  Service: Vascular;  Laterality: Left;   AV FISTULA PLACEMENT Left 10/14/2018   Procedure: Arteriovenous (Av) Fistula Creation Left Arm;  Surgeon: Young Hensen, MD;  Location: [facility] OR;  Service: Vascular;  Laterality: Left;   BASCILIC VEIN TRANSPOSITION Left 04/14/2019   Procedure: BASILIC VEIN TRANSPOSITION SECOND STAGE LEFT ARM;  Surgeon: Mayo Speck, MD;  Location: [facility] OR;  Service: Vascular;  Laterality: Left;   BIOPSY  10/14/2022   Procedure: BIOPSY;  Surgeon: Ace Holder, MD;  Location: [facility] ENDOSCOPY;  Service: Gastroenterology;;   COMPLEX WOUND CLOSURE Left 12/31/2022   Procedure: COMPLEX WOUND CLOSURE;  Surgeon: Kayla Part, MD;  Location: [facility] OR;  Service: Vascular;  Laterality: Left;   CORONARY STENT INTERVENTION N/A 05/21/2021   Procedure: CORONARY STENT INTERVENTION;  Surgeon: Arleen Lacer, MD;  [facility] INVASIVE CV LAB;  Service: Cardiovascular;  Laterality: N/A;   ESOPHAGOGASTRODUODENOSCOPY (EGD) WITH PROPOFOL  N/A 10/14/2022   Procedure: ESOPHAGOGASTRODUODENOSCOPY (EGD) WITH PROPOFOL ;  Surgeon: Ace Holder, MD;  Location: [facility] ENDOSCOPY;  Service: Gastroenterology;  Laterality: N/A;   HEMOSTASIS CLIP  PLACEMENT  10/14/2022   Procedure: HEMOSTASIS CLIP PLACEMENT;  Surgeon: Ace Holder, MD;  Location: [facility] ENDOSCOPY;  Service: Gastroenterology;;   INCISION AND DRAINAGE Left 12/31/2022   Procedure: INCISION AND DRAINAGE OF LEFT ARM;  Surgeon: Kayla Part, MD;  Location: [facility] OR;  Service: Vascular;  Laterality: Left;   INCISION AND DRAINAGE ABSCESS N/A 10/24/2020   Procedure: exicision of hydradenitis;  Surgeon: Joyce Nixon, MD;  Location: [facility];  Service: General;  Laterality: N/A;  45 min   INSERTION OF DIALYSIS CATHETER Right 04/14/2019   Procedure: INSERTION OF DIALYSIS CATHETER;  Surgeon: Mayo Speck, MD;  Location: [facility] OR;  Service: Vascular;  Laterality: Right;   IR FLUORO GUIDE CV LINE RIGHT  10/13/2022   IR REMOVAL TUN CV CATH W/O FL  10/16/2022   IR US  GUIDE VASC ACCESS RIGHT  10/13/2022   LEFT HEART CATH AND CORONARY ANGIOGRAPHY N/A 05/21/2021   Procedure: LEFT HEART CATH AND CORONARY ANGIOGRAPHY;  Surgeon: Arleen Lacer, MD;  Location: [facility] INVASIVE CV LAB;  Service: Cardiovascular;  Laterality: N/A;   LOWER EXTREMITY ANGIOGRAPHY N/A 07/05/2018   Procedure: LOWER EXTREMITY ANGIOGRAPHY;  Surgeon: Margherita Shell, MD;  [facility] INVASIVE CV LAB;  Service: Cardiovascular;  Laterality: N/A;   PERIPHERAL VASCULAR ATHERECTOMY Right 12/13/2020   Procedure: PERIPHERAL VASCULAR ATHERECTOMY;  Surgeon: Dannis Dy, MD;  Location: [facility] INVASIVE CV LAB;  Service: Cardiovascular;  Laterality: Right;  Superficial femoral   PERIPHERAL VASCULAR BALLOON ANGIOPLASTY Left 07/05/2018   Procedure: PERIPHERAL VASCULAR BALLOON ANGIOPLASTY;  Surgeon: Margherita Shell, MD;  [facility] INVASIVE CV LAB;  Service: Cardiovascular;  Laterality: Left;  SFA   PERIPHERAL VASCULAR BALLOON ANGIOPLASTY Right 12/13/2020   Procedure: PERIPHERAL VASCULAR BALLOON ANGIOPLASTY;  Surgeon: Dannis Dy, MD;  Location: [facility] INVASIVE CV LAB;  Service: Cardiovascular;  Laterality: Right;   Peroneal artery, anterior tibial artery   RECTAL EXAM UNDER ANESTHESIA N/A 10/24/2020   Procedure: EXAM UNDER ANESTHESIA;  Surgeon: Joyce Nixon, MD;  Location: [facility];  Service: General;  Laterality: N/A;   STUMP REVISION Left 10/19/2018   Procedure: REVISION LEFT BELOW KNEE AMPUTATION;  Surgeon: Timothy Ford, MD;  Location: [facility] OR;  Service: Orthopedics;  Laterality: Left;   TUBAL LIGATION  2002   Family History  Problem Relation Age of Onset   Diabetes Mother    Diabetes Daughter    Diabetes Daughter    Breast cancer Maternal Grandmother 103   Diabetes Maternal Grandmother    Diabetes Brother    Mental retardation Brother        died from PNA   Social History   Socioeconomic History   Marital status: Single    Spouse name: Not on file   Number of children: Not on file   Years of education: 10   Highest education level: Not on file  Occupational History    Employer: UNEMPLOYED  Tobacco Use   Smoking status: Former    Current packs/day: 0.00    Average packs/day: 0.1 packs/day for 10.0 years (1.0 ttl pk-yrs)    Types: Cigarettes    Start date: 11/17/2013    Quit date: 09/09/2014    Years since quitting: 9.3   Smokeless tobacco: Never  Vaping Use   Vaping status: Never Used  Substance and Sexual Activity   Alcohol  use: Not Currently   Drug use: Not Currently    Types: Marijuana    Comment: 2 times a monthy -  `last time [place] 2022   Sexual activity: Not Currently    Partners: Female, Female  Other Topics Concern   Not on file  Social History Narrative   Not on file   Social Drivers of Health   Financial Resource Strain: Low Risk  (01/12/2024)   Overall Financial Resource Strain (CARDIA)    Difficulty of Paying Living Expenses: Not hard at all  Food Insecurity: No Food Insecurity (01/12/2024)   Hunger Vital Sign    Worried About Running Out of Food in the Last Year: Never true    Ran Out of Food in the Last Year: Never true  Transportation Needs:  No Transportation  Needs (01/12/2024)   PRAPARE - Administrator, Civil Service (Medical): No    Lack of Transportation (Non-Medical): No  Physical Activity: Inactive (01/12/2024)   Exercise Vital Sign    Days of Exercise per Week: 0 days    Minutes of Exercise per Session: 0 min  Stress: Stress Concern Present (01/12/2024)   [employer] of Occupational Health - Occupational Stress Questionnaire    Feeling of Stress : Very much  Social Connections: Socially Isolated (01/12/2024)   Social Connection and Isolation Panel [NHANES]    Frequency of Communication with Friends and Family: More than three times a week    Frequency of Social Gatherings with Friends and Family: Never    Attends Religious Services: Never    Database administrator or Organizations: No    Attends Engineer, structural: Never    Marital Status: Never married    Tobacco Counseling Counseling given: Not Answered    Clinical Intake:  Pre-visit preparation completed: Yes  Pain : No/denies pain Pain Score: 0-No pain     BMI - recorded:  (WHEELCHAIR) Nutritional Risks: None Diabetes: Yes CBG done?: No Did pt. bring in CBG monitor from home?: No  Lab Results  Component Value Date   HGBA1C 8.9 (H) 11/27/2023   HGBA1C 6.1 (A) 03/02/2023   HGBA1C 6.5 (A) 10/19/2022     How often do you need to have someone help you when you read instructions, pamphlets, or other written materials from your doctor or pharmacy?: 1 - Never  Interpreter Needed?: No  Information entered by :: Jamee Keach N. Oliviana Mcgahee, LPN.   Activities of Daily Living     01/12/2024    1:07 PM 11/27/2023   11:50 PM  In your present state of health, do you have any difficulty performing the following activities:  Hearing? 0 0  Vision? 0 0  Difficulty concentrating or making decisions? 1 0  Comment due insomnia, depression, fatigue   Walking or climbing stairs? 1   Dressing or bathing? 1   Doing errands, shopping? 1 0   Preparing Food and eating ? N   Using the Toilet? Y   In the past six months, have you accidently leaked urine? Y   Do you have problems with loss of bowel control? Y   Managing your Medications? N   Managing your Finances? N   Housekeeping or managing your Housekeeping? Y     Patient Care Team: Tawkaliyar, Roya, DO as PCP - General Tobb, Kardie, DO as PCP - Cardiology (Cardiology) Maris Sickle, MD as Consulting Physician (Ophthalmology) Center, [facility]  I have updated your Care Teams any recent Medical Services you may have received from other providers in the past year.     Assessment:    This is a routine wellness examination for Kathy Frank.  Hearing/Vision screen Hearing Screening - Comments:: Denies hearing difficulties.  Vision Screening - Comments:: No rx eyeglasses - up to date with routine eye exams with     Goals Addressed             This Visit's Progress    Client understands the importance of follow-up with providers by attending scheduled visits         Depression Screen     01/12/2024    1:24 PM 12/30/2023    1:29 PM 05/21/2023    9:50 AM 05/10/2023    9:56 AM 03/02/2023   10:04 AM 11/03/2022    2:56 PM 10/19/2022  3:09 PM  PHQ 2/9 Scores  PHQ - 2 Score 6 6 2  0 0 2 2  PHQ- 9 Score 22 23 5   17 23     Fall Risk     01/12/2024    1:12 PM 05/21/2023    9:50 AM 03/02/2023   10:04 AM 02/15/2023   12:05 PM 11/03/2022    2:37 PM  Fall Risk   Falls in the past year? 1 0 0 0 0  Number falls in past yr: 0 0 0 0 0  Injury with Fall? 1 0 0 0 0  Risk for fall due to : History of fall(s);Impaired balance/gait;Impaired mobility;Orthopedic patient;Mental status change  No Fall Risks  No Fall Risks  Follow up Falls evaluation completed;Education provided  Falls evaluation completed Falls evaluation completed Falls evaluation completed;Falls prevention discussed    MEDICARE RISK AT HOME:  Medicare Risk at Home Any stairs in or around the home?: No If  so, are there any without handrails?: No Home free of loose throw rugs in walkways, pet beds, electrical cords, etc?: Yes Adequate lighting in your home to reduce risk of falls?: Yes Life alert?: Yes Use of a cane, walker or w/c?: Yes Grab bars in the bathroom?: Yes Shower chair or bench in shower?: Yes Elevated toilet seat or a handicapped toilet?: Yes  TIMED UP AND GO:  Was the test performed?  No  Cognitive Function: Declined/Normal: No cognitive concerns noted by patient or family. Patient alert, oriented, able to answer questions appropriately and recall recent events. No signs of memory loss or confusion.    01/12/2024    1:11 PM  MMSE - Mini Mental State Exam  Not completed: Unable to complete        10/19/2022    3:09 PM  6CIT Screen  What Year? 0 points  What month? 0 points  What time? 0 points  Count back from 20 0 points  Months in reverse 0 points  Repeat phrase 0 points  Total Score 0 points    Immunizations Immunization History  Administered Date(s) Administered   Hepatitis B, ADULT 05/27/2016, 07/08/2016, 05/25/2019, 06/22/2019, 07/20/2019, 11/23/2019   Influenza, Quadrivalent, Recombinant, Inj, Pf 05/06/2019, 05/28/2022   Influenza, Seasonal, Injecte, Preservative Fre 05/10/2023   Influenza,inj,Quad PF,6+ Mos 04/21/2016, 05/17/2017, 10/09/2018, 05/06/2019, 05/29/2020, 04/22/2021   Meningococcal Mcv4o 07/08/2016, 10/07/2016   Moderna Sars-Covid-2 Vaccination 10/31/2019   Pneumococcal Conjugate-13 01/05/2018   Pneumococcal Polysaccharide-23 11/16/2013   Tdap 09/17/2017    Screening Tests Health Maintenance  Topic Date Due   OPHTHALMOLOGY EXAM  05/22/2017   Pneumococcal Vaccine 55-72 Years old (3 of 3 - PPSV23, PCV20 or PCV21) 11/17/2018   COVID-19 Vaccine (2 - Moderna risk series) 11/28/2019   Colonoscopy  Never done   Cervical Cancer Screening (HPV/Pap Cotest)  02/05/2023   HEMOGLOBIN A1C  02/26/2024   INFLUENZA VACCINE  03/10/2024   LIPID PANEL   04/26/2024   Medicare Annual Wellness (AWV)  01/11/2025   DTaP/Tdap/Td (2 - Td or Tdap) 09/18/2027   Hepatitis C Screening  Completed   HIV Screening  Completed   HPV VACCINES  Aged Out   Meningococcal B Vaccine  Aged Out   FOOT EXAM  Discontinued    Health Maintenance  Health Maintenance Due  Topic Date Due   OPHTHALMOLOGY EXAM  05/22/2017   Pneumococcal Vaccine 2-55 Years old (3 of 3 - PPSV23, PCV20 or PCV21) 11/17/2018   COVID-19 Vaccine (2 - Moderna risk series) 11/28/2019   Colonoscopy  Never done   Cervical Cancer Screening (HPV/Pap Cotest)  02/05/2023   Health Maintenance Items Addressed: Yes Patient aware of current care gaps.  [facility] was verified, no new immunizations on file.  Patient is due for the following: Cervical cancer Screening, Colonoscioy, Diabetic Eye Exam, Pneumonia and Covid vaccines.  Additional Screening:  Vision Screening: Recommended annual ophthalmology exams for early detection of glaucoma and other disorders of the eye. Would you like a referral to an eye doctor? No    Dental Screening: Recommended annual dental exams for proper oral hygiene  Community Resource Referral / Chronic Care Management: CRR required this visit?  No   CCM required this visit?  No   Plan:    I have personally reviewed and noted the following in the patient's chart:   Medical and social history Use of alcohol , tobacco or illicit drugs  Current medications and supplements including opioid prescriptions. Patient is not currently taking opioid prescriptions. Functional ability and status Nutritional status Physical activity Advanced directives List of other physicians Hospitalizations, surgeries, and ER visits in previous 12 months Vitals Screenings to include cognitive, depression, and falls Referrals and appointments  In addition, I have reviewed and discussed with patient certain preventive protocols, quality metrics, and best practice recommendations. A  written personalized care plan for preventive services as well as general preventive health recommendations were provided to patient.   Margette Sheldon, LPN   08/15/1094   After Visit Summary: (Declined) Due to this being a telephonic visit, with patients personalized plan was offered to patient but patient Declined AVS at this time   Notes: Patient aware of current care gaps.  NCIR was verified, no new immunizations on file.  Patient is due for the following: Cervical cancer Screening, Colonoscioy, Diabetic Eye Exam, Pneumonia and Covid vaccines.

## 2024-01-14 DIAGNOSIS — Z992 Dependence on renal dialysis: Secondary | ICD-10-CM | POA: Diagnosis not present

## 2024-01-14 DIAGNOSIS — E1129 Type 2 diabetes mellitus with other diabetic kidney complication: Secondary | ICD-10-CM | POA: Diagnosis not present

## 2024-01-14 DIAGNOSIS — N2581 Secondary hyperparathyroidism of renal origin: Secondary | ICD-10-CM | POA: Diagnosis not present

## 2024-01-14 DIAGNOSIS — I120 Hypertensive chronic kidney disease with stage 5 chronic kidney disease or end stage renal disease: Secondary | ICD-10-CM | POA: Diagnosis not present

## 2024-01-14 DIAGNOSIS — D631 Anemia in chronic kidney disease: Secondary | ICD-10-CM | POA: Diagnosis not present

## 2024-01-14 DIAGNOSIS — N186 End stage renal disease: Secondary | ICD-10-CM | POA: Diagnosis not present

## 2024-01-14 DIAGNOSIS — E875 Hyperkalemia: Secondary | ICD-10-CM | POA: Diagnosis not present

## 2024-01-14 DIAGNOSIS — R197 Diarrhea, unspecified: Secondary | ICD-10-CM | POA: Diagnosis not present

## 2024-01-15 DIAGNOSIS — D631 Anemia in chronic kidney disease: Secondary | ICD-10-CM | POA: Diagnosis not present

## 2024-01-15 DIAGNOSIS — I120 Hypertensive chronic kidney disease with stage 5 chronic kidney disease or end stage renal disease: Secondary | ICD-10-CM | POA: Diagnosis not present

## 2024-01-15 DIAGNOSIS — N186 End stage renal disease: Secondary | ICD-10-CM | POA: Diagnosis not present

## 2024-01-15 DIAGNOSIS — E875 Hyperkalemia: Secondary | ICD-10-CM | POA: Diagnosis not present

## 2024-01-15 DIAGNOSIS — E1129 Type 2 diabetes mellitus with other diabetic kidney complication: Secondary | ICD-10-CM | POA: Diagnosis not present

## 2024-01-15 DIAGNOSIS — Z992 Dependence on renal dialysis: Secondary | ICD-10-CM | POA: Diagnosis not present

## 2024-01-15 DIAGNOSIS — R197 Diarrhea, unspecified: Secondary | ICD-10-CM | POA: Diagnosis not present

## 2024-01-15 DIAGNOSIS — N2581 Secondary hyperparathyroidism of renal origin: Secondary | ICD-10-CM | POA: Diagnosis not present

## 2024-01-17 ENCOUNTER — Other Ambulatory Visit: Payer: Self-pay

## 2024-01-18 ENCOUNTER — Other Ambulatory Visit: Payer: Self-pay

## 2024-01-18 DIAGNOSIS — R197 Diarrhea, unspecified: Secondary | ICD-10-CM | POA: Diagnosis not present

## 2024-01-18 DIAGNOSIS — E1129 Type 2 diabetes mellitus with other diabetic kidney complication: Secondary | ICD-10-CM | POA: Diagnosis not present

## 2024-01-18 DIAGNOSIS — N2581 Secondary hyperparathyroidism of renal origin: Secondary | ICD-10-CM | POA: Diagnosis not present

## 2024-01-18 DIAGNOSIS — Z992 Dependence on renal dialysis: Secondary | ICD-10-CM | POA: Diagnosis not present

## 2024-01-18 DIAGNOSIS — I120 Hypertensive chronic kidney disease with stage 5 chronic kidney disease or end stage renal disease: Secondary | ICD-10-CM | POA: Diagnosis not present

## 2024-01-18 DIAGNOSIS — D631 Anemia in chronic kidney disease: Secondary | ICD-10-CM | POA: Diagnosis not present

## 2024-01-18 DIAGNOSIS — N186 End stage renal disease: Secondary | ICD-10-CM | POA: Diagnosis not present

## 2024-01-18 DIAGNOSIS — E875 Hyperkalemia: Secondary | ICD-10-CM | POA: Diagnosis not present

## 2024-01-20 DIAGNOSIS — N186 End stage renal disease: Secondary | ICD-10-CM | POA: Diagnosis not present

## 2024-01-20 DIAGNOSIS — N2581 Secondary hyperparathyroidism of renal origin: Secondary | ICD-10-CM | POA: Diagnosis not present

## 2024-01-20 DIAGNOSIS — E1129 Type 2 diabetes mellitus with other diabetic kidney complication: Secondary | ICD-10-CM | POA: Diagnosis not present

## 2024-01-20 DIAGNOSIS — D631 Anemia in chronic kidney disease: Secondary | ICD-10-CM | POA: Diagnosis not present

## 2024-01-20 DIAGNOSIS — R197 Diarrhea, unspecified: Secondary | ICD-10-CM | POA: Diagnosis not present

## 2024-01-20 DIAGNOSIS — Z992 Dependence on renal dialysis: Secondary | ICD-10-CM | POA: Diagnosis not present

## 2024-01-20 DIAGNOSIS — E875 Hyperkalemia: Secondary | ICD-10-CM | POA: Diagnosis not present

## 2024-01-20 DIAGNOSIS — I120 Hypertensive chronic kidney disease with stage 5 chronic kidney disease or end stage renal disease: Secondary | ICD-10-CM | POA: Diagnosis not present

## 2024-01-22 DIAGNOSIS — D631 Anemia in chronic kidney disease: Secondary | ICD-10-CM | POA: Diagnosis not present

## 2024-01-22 DIAGNOSIS — E875 Hyperkalemia: Secondary | ICD-10-CM | POA: Diagnosis not present

## 2024-01-22 DIAGNOSIS — N2581 Secondary hyperparathyroidism of renal origin: Secondary | ICD-10-CM | POA: Diagnosis not present

## 2024-01-22 DIAGNOSIS — E1129 Type 2 diabetes mellitus with other diabetic kidney complication: Secondary | ICD-10-CM | POA: Diagnosis not present

## 2024-01-22 DIAGNOSIS — Z992 Dependence on renal dialysis: Secondary | ICD-10-CM | POA: Diagnosis not present

## 2024-01-22 DIAGNOSIS — R197 Diarrhea, unspecified: Secondary | ICD-10-CM | POA: Diagnosis not present

## 2024-01-22 DIAGNOSIS — I120 Hypertensive chronic kidney disease with stage 5 chronic kidney disease or end stage renal disease: Secondary | ICD-10-CM | POA: Diagnosis not present

## 2024-01-22 DIAGNOSIS — N186 End stage renal disease: Secondary | ICD-10-CM | POA: Diagnosis not present

## 2024-01-24 ENCOUNTER — Other Ambulatory Visit: Payer: Self-pay

## 2024-01-24 ENCOUNTER — Other Ambulatory Visit: Payer: Self-pay | Admitting: Infectious Diseases

## 2024-01-24 DIAGNOSIS — B20 Human immunodeficiency virus [HIV] disease: Secondary | ICD-10-CM

## 2024-01-24 MED ORDER — BIKTARVY 50-200-25 MG PO TABS
1.0000 | ORAL_TABLET | Freq: Every day | ORAL | 0 refills | Status: DC
  Filled 2024-01-24: qty 30, 30d supply, fill #0

## 2024-01-24 NOTE — Progress Notes (Signed)
 Specialty Pharmacy Refill Coordination Note  Kathy Frank is a 49 y.o. female contacted today regarding refills of specialty medication(s) Bictegravir-Emtricitab-Tenofov (Biktarvy )   Patient requested Delivery   Delivery date: 01/26/24   Verified address: [address]   [place] [id number]   Medication will be filled on 01/25/24, pending refill approval.

## 2024-01-24 NOTE — Progress Notes (Signed)
 I reviewed the AWV findings of the licensed medical professional who conducted the visit. I was present in the office suite and immediately available to provide assistance and direction throughout the time the service was provided.

## 2024-01-25 DIAGNOSIS — N186 End stage renal disease: Secondary | ICD-10-CM | POA: Diagnosis not present

## 2024-01-25 DIAGNOSIS — T879 Unspecified complications of amputation stump: Secondary | ICD-10-CM | POA: Diagnosis not present

## 2024-01-25 DIAGNOSIS — E875 Hyperkalemia: Secondary | ICD-10-CM | POA: Diagnosis not present

## 2024-01-25 DIAGNOSIS — N2581 Secondary hyperparathyroidism of renal origin: Secondary | ICD-10-CM | POA: Diagnosis not present

## 2024-01-25 DIAGNOSIS — R197 Diarrhea, unspecified: Secondary | ICD-10-CM | POA: Diagnosis not present

## 2024-01-25 DIAGNOSIS — D631 Anemia in chronic kidney disease: Secondary | ICD-10-CM | POA: Diagnosis not present

## 2024-01-25 DIAGNOSIS — Z992 Dependence on renal dialysis: Secondary | ICD-10-CM | POA: Diagnosis not present

## 2024-01-25 DIAGNOSIS — R2990 Unspecified symptoms and signs involving the nervous system: Secondary | ICD-10-CM | POA: Diagnosis not present

## 2024-01-25 DIAGNOSIS — E1129 Type 2 diabetes mellitus with other diabetic kidney complication: Secondary | ICD-10-CM | POA: Diagnosis not present

## 2024-01-25 DIAGNOSIS — I120 Hypertensive chronic kidney disease with stage 5 chronic kidney disease or end stage renal disease: Secondary | ICD-10-CM | POA: Diagnosis not present

## 2024-01-27 DIAGNOSIS — R197 Diarrhea, unspecified: Secondary | ICD-10-CM | POA: Diagnosis not present

## 2024-01-27 DIAGNOSIS — Z992 Dependence on renal dialysis: Secondary | ICD-10-CM | POA: Diagnosis not present

## 2024-01-27 DIAGNOSIS — E875 Hyperkalemia: Secondary | ICD-10-CM | POA: Diagnosis not present

## 2024-01-27 DIAGNOSIS — D631 Anemia in chronic kidney disease: Secondary | ICD-10-CM | POA: Diagnosis not present

## 2024-01-27 DIAGNOSIS — E1129 Type 2 diabetes mellitus with other diabetic kidney complication: Secondary | ICD-10-CM | POA: Diagnosis not present

## 2024-01-27 DIAGNOSIS — N2581 Secondary hyperparathyroidism of renal origin: Secondary | ICD-10-CM | POA: Diagnosis not present

## 2024-01-27 DIAGNOSIS — I120 Hypertensive chronic kidney disease with stage 5 chronic kidney disease or end stage renal disease: Secondary | ICD-10-CM | POA: Diagnosis not present

## 2024-01-27 DIAGNOSIS — N186 End stage renal disease: Secondary | ICD-10-CM | POA: Diagnosis not present

## 2024-02-01 DIAGNOSIS — N186 End stage renal disease: Secondary | ICD-10-CM | POA: Diagnosis not present

## 2024-02-01 DIAGNOSIS — E1129 Type 2 diabetes mellitus with other diabetic kidney complication: Secondary | ICD-10-CM | POA: Diagnosis not present

## 2024-02-01 DIAGNOSIS — I120 Hypertensive chronic kidney disease with stage 5 chronic kidney disease or end stage renal disease: Secondary | ICD-10-CM | POA: Diagnosis not present

## 2024-02-01 DIAGNOSIS — R197 Diarrhea, unspecified: Secondary | ICD-10-CM | POA: Diagnosis not present

## 2024-02-01 DIAGNOSIS — Z992 Dependence on renal dialysis: Secondary | ICD-10-CM | POA: Diagnosis not present

## 2024-02-01 DIAGNOSIS — E875 Hyperkalemia: Secondary | ICD-10-CM | POA: Diagnosis not present

## 2024-02-01 DIAGNOSIS — D631 Anemia in chronic kidney disease: Secondary | ICD-10-CM | POA: Diagnosis not present

## 2024-02-01 DIAGNOSIS — N2581 Secondary hyperparathyroidism of renal origin: Secondary | ICD-10-CM | POA: Diagnosis not present

## 2024-02-03 DIAGNOSIS — D631 Anemia in chronic kidney disease: Secondary | ICD-10-CM | POA: Diagnosis not present

## 2024-02-03 DIAGNOSIS — E875 Hyperkalemia: Secondary | ICD-10-CM | POA: Diagnosis not present

## 2024-02-03 DIAGNOSIS — Z992 Dependence on renal dialysis: Secondary | ICD-10-CM | POA: Diagnosis not present

## 2024-02-03 DIAGNOSIS — E1129 Type 2 diabetes mellitus with other diabetic kidney complication: Secondary | ICD-10-CM | POA: Diagnosis not present

## 2024-02-03 DIAGNOSIS — N186 End stage renal disease: Secondary | ICD-10-CM | POA: Diagnosis not present

## 2024-02-03 DIAGNOSIS — R197 Diarrhea, unspecified: Secondary | ICD-10-CM | POA: Diagnosis not present

## 2024-02-03 DIAGNOSIS — I120 Hypertensive chronic kidney disease with stage 5 chronic kidney disease or end stage renal disease: Secondary | ICD-10-CM | POA: Diagnosis not present

## 2024-02-03 DIAGNOSIS — N2581 Secondary hyperparathyroidism of renal origin: Secondary | ICD-10-CM | POA: Diagnosis not present

## 2024-02-05 DIAGNOSIS — R197 Diarrhea, unspecified: Secondary | ICD-10-CM | POA: Diagnosis not present

## 2024-02-05 DIAGNOSIS — N186 End stage renal disease: Secondary | ICD-10-CM | POA: Diagnosis not present

## 2024-02-05 DIAGNOSIS — E1129 Type 2 diabetes mellitus with other diabetic kidney complication: Secondary | ICD-10-CM | POA: Diagnosis not present

## 2024-02-05 DIAGNOSIS — I120 Hypertensive chronic kidney disease with stage 5 chronic kidney disease or end stage renal disease: Secondary | ICD-10-CM | POA: Diagnosis not present

## 2024-02-05 DIAGNOSIS — D631 Anemia in chronic kidney disease: Secondary | ICD-10-CM | POA: Diagnosis not present

## 2024-02-05 DIAGNOSIS — E875 Hyperkalemia: Secondary | ICD-10-CM | POA: Diagnosis not present

## 2024-02-05 DIAGNOSIS — Z992 Dependence on renal dialysis: Secondary | ICD-10-CM | POA: Diagnosis not present

## 2024-02-05 DIAGNOSIS — N2581 Secondary hyperparathyroidism of renal origin: Secondary | ICD-10-CM | POA: Diagnosis not present

## 2024-02-07 DIAGNOSIS — Z992 Dependence on renal dialysis: Secondary | ICD-10-CM | POA: Diagnosis not present

## 2024-02-07 DIAGNOSIS — N186 End stage renal disease: Secondary | ICD-10-CM | POA: Diagnosis not present

## 2024-02-07 DIAGNOSIS — E1129 Type 2 diabetes mellitus with other diabetic kidney complication: Secondary | ICD-10-CM | POA: Diagnosis not present

## 2024-02-08 ENCOUNTER — Other Ambulatory Visit: Payer: Self-pay

## 2024-02-08 ENCOUNTER — Encounter (HOSPITAL_COMMUNITY): Payer: Self-pay

## 2024-02-08 ENCOUNTER — Encounter: Payer: Self-pay | Admitting: Infectious Diseases

## 2024-02-08 ENCOUNTER — Ambulatory Visit (INDEPENDENT_AMBULATORY_CARE_PROVIDER_SITE_OTHER): Admitting: Infectious Diseases

## 2024-02-08 VITALS — BP 160/78 | HR 83

## 2024-02-08 DIAGNOSIS — Z23 Encounter for immunization: Secondary | ICD-10-CM

## 2024-02-08 DIAGNOSIS — B20 Human immunodeficiency virus [HIV] disease: Secondary | ICD-10-CM

## 2024-02-08 DIAGNOSIS — Z5181 Encounter for therapeutic drug level monitoring: Secondary | ICD-10-CM

## 2024-02-08 DIAGNOSIS — Z0000 Encounter for general adult medical examination without abnormal findings: Secondary | ICD-10-CM

## 2024-02-08 DIAGNOSIS — Z113 Encounter for screening for infections with a predominantly sexual mode of transmission: Secondary | ICD-10-CM | POA: Insufficient documentation

## 2024-02-08 MED ORDER — BIKTARVY 50-200-25 MG PO TABS
1.0000 | ORAL_TABLET | Freq: Every day | ORAL | 3 refills | Status: AC
  Filled 2024-02-08: qty 90, 90d supply, fill #0
  Filled 2024-02-17: qty 30, 30d supply, fill #0
  Filled 2024-03-17 – 2024-04-04 (×2): qty 30, 30d supply, fill #1
  Filled 2024-04-27: qty 30, 30d supply, fill #2
  Filled 2024-05-25: qty 30, 30d supply, fill #3
  Filled 2024-06-16 – 2024-06-19 (×2): qty 30, 30d supply, fill #4
  Filled 2024-07-20: qty 30, 30d supply, fill #5
  Filled 2024-08-23: qty 30, 30d supply, fill #6
  Filled 2024-09-12: qty 30, 30d supply, fill #7

## 2024-02-08 NOTE — Progress Notes (Unsigned)
 [address], [place], [place], [address]                                                                  Phn. [phone]; Fax: [phone]                                                                          Date: 02/08/24  Reason for Visit: Routine HIV care.  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 Patient previously followed by Dr Elaine who is retired now   Interval hx/current visit: Admitted 4/19-4/21 for volume overload/CHF in the setting of non compliance with RRT. Last seen by PCP on 5/22 in the context of anxeity and depression, medications were adjusted. She is also planned to fu with  Psychotherapy.   Reports taking Biktarvy  without missing doses or concerns.  Lives with daughter. Reports she follows with Psychiatrist. Discussed regarding colon ca, breast ca and cervical ca screening. Denies being sexually active or smoking, alcohol  and recreational drugs. Willing to get PCV 20 today. No complaints otherwise.   ROS: As stated in above HPI; all other systems were reviewed and are otherwise negative unless noted below  No reported fever / chills, night sweats, unintentional weight loss, acute visual change, odynophagia, chest pain/pressure, new or worsened SOB or WOB, nausea, vomiting, dysuria, GU discharge, syncope, seizures, red/hot swollen joints, hallucinations / delusions, rashes, new allergies, unusual  / excessive bleeding, swollen lymph nodes  PMH/ PSH/ FamHx / Social Hx , medications and allergies reviewed and updated as appropriate; please see corresponding tab in EHR / prior notes                                        Current Outpatient Medications on File Prior to Visit  Medication Sig Dispense Refill   Accu-Chek Softclix Lancets lancets Use as instructed 100 each 12   albuterol  (VENTOLIN  HFA) 108 (90 Base) MCG/ACT inhaler Inhale 2 puffs into the lungs every 4 (four) hours as needed for wheezing or shortness of breath. 18 g 0   amLODipine -olmesartan  (AZOR ) 10-40 MG tablet Take 1 tablet by mouth daily. 90 tablet 3   aspirin  EC (EQ ASPIRIN  ADULT  LOW DOSE) 81 MG tablet Take 1 tablet (81 mg total) by mouth daily. Swallow whole. 120 tablet 2   atorvastatin  (LIPITOR ) 80 MG tablet Take 1 tablet (80 mg total) by mouth daily. 30 tablet 11   bictegravir-emtricitabine -tenofovir  AF (BIKTARVY ) 50-200-25 MG TABS tablet Take 1 tablet by mouth daily. Try to take at the same time each day with or without food. 30 tablet 0   busPIRone  (BUSPAR ) 5 MG tablet Take 2.5 mg by mouth 2 (two) times daily. Take 1/2 tablet twice a day at 9am & 5pm.     calcitRIOL  (ROCALTROL ) 0.25 MCG capsule Take 5 capsules (1.25 mcg total) by mouth Every Tuesday,Thursday,and Saturday with dialysis. 60 capsule 0   calcium  acetate (PHOSLO ) 667 MG capsule Take 2 capsules (1,334 mg total) by mouth 3 (three) times daily with meals. 360 capsule 0   carvedilol  (COREG ) 12.5 MG tablet Take 1 tablet (12.5 mg total) by mouth 2 (two) times daily with a meal. 60 tablet 0   Continuous Blood Gluc Sensor (DEXCOM G7 SENSOR) MISC Use to check blood sugar continuously 10 each 6   cyclobenzaprine  (FLEXERIL ) 5 MG tablet Take 1 tablet (5 mg total) by mouth 2 (two) times daily as needed for muscle spasms. 30 tablet 1   escitalopram  (LEXAPRO ) 10 MG tablet Take 1 tablet (10 mg total) by mouth daily. 30 tablet 3   glucose blood test strip Use as instructed  100 each 12   insulin  glargine (LANTUS  SOLOSTAR) 100 UNIT/ML Solostar Pen Inject 15 Units into the skin at bedtime. 6 mL 4   Insulin  Pen Needle (PENTIPS) 32G X 4 MM MISC Use once daily with insulin  and once daily with victoza  100 each 3   liraglutide  (VICTOZA ) 18 MG/3ML SOPN Inject 0.6 mg into the skin daily. 3 mL 11   LOKELMA  5 g packet Take 5 g (1 packet total) by mouth daily. 30 packet 0   multivitamin (RENA-VIT) TABS tablet Take 1 tablet by mouth daily. (Patient taking differently: Take 1 tablet by mouth See admin instructions. On Tuesday, Wednesday and Thursday) 30 tablet 0   psyllium (HYDROCIL/METAMUCIL) 95 % PACK Take 1 packet by mouth daily. 60 each 0   rOPINIRole  (REQUIP ) 0.25 MG tablet Take 0.25 mg by mouth at bedtime.     UBRELVY  50 MG TABS TAKE ONE TABLET (50 MG TOTAL) BY MOUTH AS NEEDED FOR MIGRAINE. IF NEEDED A SECOND DOSE MAY BE TAKEN AT LEAST 2 HOURS AFTER THE INITIAL DOSE. MAX 8 DOSES IN A MONTH 10 tablet 11   No current facility-administered medications on file prior to visit.   No Known Allergies  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   Anemia, posthemorrhagic, acute 10/14/2022   Anxiety    CAD (coronary artery disease)    CAP (community acquired pneumonia) 09/29/2022   CVA (cerebral vascular accident) [facility])    Depression 06/28/2006   Qualifier: Diagnosis of  By: Pati MD, Todd     Diabetes mellitus type 2 in obese 06/28/1994   Dyspnea    uses oxygeb 2 liters per minute at dialysis   End stage renal disease on dialysis [facility])    on hemodialysis T/Th/Sat   Erosive esophagitis    Esophageal reflux    Eye redness    Fecal incontinence 02/04/2018   Gastroparesis    ? diabetic   GERD (gastroesophageal reflux disease)    Human immunodeficiency virus (HIV) disease (HCC)  04/23/2016   Hyperkalemia 10/12/2022   Hyperlipidemia    Hypertension    Hypertensive emergency  11/09/2021   Hypertensive urgency 05/20/2021   Metabolic bone disease 05/02/2019   Moderate nonproliferative diabetic retinopathy of both eyes (HCC) 11/21/2014   11/14/14: Noted on retinal imaging; needs follow-up imaging in 6 months  05/22/16: Noted on retinal imaging again; needs follow-up imaging in 6 months   PAD (peripheral artery disease) (HCC)    Type 2 diabetes mellitus with diabetic peripheral angiopathy without gangrene (HCC) 05/01/2019   Wound infection s/p L transmetatarsal amputation    Past Surgical History:  Procedure Laterality Date   A/V SHUNTOGRAM Left 04/21/2023   Procedure: A/V SHUNTOGRAM;  Surgeon: Lanis Fonda BRAVO, MD;  Location: [facility] INVASIVE CV LAB;  Service: Cardiovascular;  Laterality: Left;   ABDOMINAL AORTOGRAM W/LOWER EXTREMITY N/A 12/13/2020   Procedure: ABDOMINAL AORTOGRAM W/LOWER EXTREMITY;  Surgeon: Eliza Lonni RAMAN, MD;  Location: [facility] INVASIVE CV LAB;  Service: Cardiovascular;  Laterality: N/A;   AMPUTATION Left 07/08/2018   Procedure: AMPUTATION FORTH RAY LEFT FOOT;  Surgeon: Harden Jerona GAILS, MD;  Location: [facility] OR;  Service: Orthopedics;  Laterality: Left;   AMPUTATION Left 08/09/2018   Procedure: Left Transmetatarsal Amputation;  Surgeon: Harden Jerona GAILS, MD;  Location: [facility] OR;  Service: Orthopedics;  Laterality: Left;   AMPUTATION Left 10/08/2018   Procedure: LEFT BELOW KNEE AMPUTATION;  Surgeon: Harden Jerona GAILS, MD;  Location: [facility] OR;  Service: Orthopedics;  Laterality: Left;   AMPUTATION Left 10/28/2018   Procedure: REVISION BELOW KNEE AMPUTATION;  Surgeon: Harden Jerona GAILS, MD;  Location: [facility] OR;  Service: Orthopedics;  Laterality: Left;   AMPUTATION Left 12/16/2018   Procedure: LEFT ABOVE KNEE AMPUTATION;  Surgeon: Harden Jerona GAILS, MD;  Location: [facility] OR;  Service: Orthopedics;  Laterality: Left;   AMPUTATION Right 01/31/2021   Procedure: AMPUTATION BELOW KNEE;  Surgeon: Serene Gaile ORN, MD;  Location: [facility] OR;  Service: Vascular;  Laterality: Right;   AMPUTATION Right  03/28/2021   Procedure: REVISION OF RIGHT BELOW KNEE AMPUTATION;  Surgeon: Serene Gaile ORN, MD;  Location: [facility] OR;  Service: Vascular;  Laterality: Right;   AMPUTATION Right 04/18/2021   Procedure: right below knee amputation/washout placement wound vac;  Surgeon: Magda Debby SAILOR, MD;  Location: [facility] OR;  Service: Vascular;  Laterality: Right;   AMPUTATION Right 02/19/2023   Procedure: CONVERSION TO ABOVE KNEE AMPUTATION;  Surgeon: Sheree Penne Lonni, MD;  Location: [facility] OR;  Service: Vascular;  Laterality: Right;   APPLICATION OF WOUND VAC Left 12/31/2022   Procedure: APPLICATION OF PREVENA WOUND VAC;  Surgeon: Lanis Fonda BRAVO, MD;  Location: [facility] OR;  Service: Vascular;  Laterality: Left;   AV FISTULA PLACEMENT Left 10/14/2018   Procedure: Arteriovenous (Av) Fistula Creation Left Arm;  Surgeon: Gretta Lonni PARAS, MD;  Location: [facility] OR;  Service: Vascular;  Laterality: Left;   BASCILIC VEIN TRANSPOSITION Left 04/14/2019   Procedure: BASILIC VEIN TRANSPOSITION SECOND STAGE LEFT ARM;  Surgeon: Oris Krystal FALCON, MD;  Location: [facility] OR;  Service: Vascular;  Laterality: Left;   BIOPSY  10/14/2022   Procedure: BIOPSY;  Surgeon: Leigh Elspeth SQUIBB, MD;  Location: [facility] ENDOSCOPY;  Service: Gastroenterology;;   COMPLEX WOUND CLOSURE Left 12/31/2022   Procedure: COMPLEX WOUND CLOSURE;  Surgeon: Lanis Fonda BRAVO, MD;  Location: [facility] OR;  Service: Vascular;  Laterality: Left;   CORONARY STENT INTERVENTION N/A 05/21/2021   Procedure: CORONARY STENT INTERVENTION;  Surgeon: Anner Alm ORN, MD;  Location: [facility] INVASIVE CV LAB;  Service: Cardiovascular;  Laterality: N/A;   ESOPHAGOGASTRODUODENOSCOPY (EGD) WITH PROPOFOL  N/A 10/14/2022   Procedure: ESOPHAGOGASTRODUODENOSCOPY (EGD) WITH PROPOFOL ;  Surgeon: Leigh Elspeth SQUIBB, MD;  Location: [facility] ENDOSCOPY;  Service: Gastroenterology;  Laterality: N/A;   HEMOSTASIS CLIP PLACEMENT  10/14/2022   Procedure: HEMOSTASIS CLIP PLACEMENT;  Surgeon: Leigh Elspeth SQUIBB, MD;  Location: [facility]  ENDOSCOPY;  Service: Gastroenterology;;   INCISION AND DRAINAGE Left 12/31/2022   Procedure: INCISION AND DRAINAGE OF LEFT ARM;  Surgeon: Lanis Fonda BRAVO, MD;  Location: [facility] OR;  Service: Vascular;  Laterality: Left;   INCISION AND DRAINAGE ABSCESS N/A 10/24/2020   Procedure: exicision of hydradenitis;  Surgeon: Debby Hila, MD;  Location: [facility];  Service: General;  Laterality: N/A;  45 min   INSERTION OF DIALYSIS CATHETER Right 04/14/2019   Procedure: INSERTION OF DIALYSIS CATHETER;  Surgeon: Oris Krystal FALCON, MD;  Location: [facility] OR;  Service: Vascular;  Laterality: Right;   IR FLUORO GUIDE CV LINE RIGHT  10/13/2022   IR REMOVAL TUN CV CATH W/O FL  10/16/2022   IR US  GUIDE VASC ACCESS RIGHT  10/13/2022   LEFT HEART CATH AND CORONARY ANGIOGRAPHY N/A 05/21/2021   Procedure: LEFT HEART CATH AND CORONARY ANGIOGRAPHY;  Surgeon: Anner Alm ORN, MD;  Location: [facility] INVASIVE CV LAB;  Service: Cardiovascular;  Laterality: N/A;   LOWER EXTREMITY ANGIOGRAPHY N/A 07/05/2018   Procedure: LOWER EXTREMITY ANGIOGRAPHY;  Surgeon: Serene Gaile ORN, MD;  Location: [facility] INVASIVE CV LAB;  Service: Cardiovascular;  Laterality: N/A;   PERIPHERAL VASCULAR ATHERECTOMY Right 12/13/2020   Procedure: PERIPHERAL VASCULAR ATHERECTOMY;  Surgeon: Eliza Lonni RAMAN, MD;  Location: [facility] INVASIVE CV LAB;  Service: Cardiovascular;  Laterality: Right;  Superficial femoral   PERIPHERAL VASCULAR BALLOON ANGIOPLASTY Left 07/05/2018   Procedure: PERIPHERAL VASCULAR BALLOON ANGIOPLASTY;  Surgeon: Serene Gaile ORN, MD;  Location: [facility] INVASIVE CV LAB;  Service: Cardiovascular;  Laterality: Left;  SFA   PERIPHERAL VASCULAR BALLOON ANGIOPLASTY Right 12/13/2020   Procedure: PERIPHERAL VASCULAR BALLOON ANGIOPLASTY;  Surgeon: Eliza Lonni RAMAN, MD;  Location: [facility] INVASIVE CV LAB;  Service: Cardiovascular;  Laterality: Right;  Peroneal artery, anterior tibial artery   RECTAL EXAM UNDER ANESTHESIA N/A 10/24/2020   Procedure: EXAM UNDER  ANESTHESIA;  Surgeon: Debby Hila, MD;  Location: [facility];  Service: General;  Laterality: N/A;   STUMP REVISION Left 10/19/2018   Procedure: REVISION LEFT BELOW KNEE AMPUTATION;  Surgeon: Harden Jerona GAILS, MD;  Location: [facility] OR;  Service: Orthopedics;  Laterality: Left;   TUBAL LIGATION  2002   Social History   Socioeconomic History   Marital status: Single    Spouse name: Not on file   Number of children: Not on file   Years of education: 10   Highest education level: Not on file  Occupational History    Employer: UNEMPLOYED  Tobacco Use   Smoking status: Former    Current packs/day: 0.00    Average packs/day: 0.1 packs/day for 10.0 years (1.0 ttl pk-yrs)    Types: Cigarettes    Start date: 11/17/2013    Quit date: 09/09/2014    Years since quitting: 9.4   Smokeless tobacco: Never  Vaping Use   Vaping status: Never Used  Substance and Sexual Activity   Alcohol  use: Not Currently   Drug use: Not Currently    Types: Marijuana    Comment: 2 times a monthy -  `last time [place] 2022   Sexual activity: Not Currently    Partners: Female, Female  Other Topics Concern   Not on file  Social History Narrative   Not on file   Social Drivers of Health   Financial Resource Strain: Low Risk  (01/12/2024)   Overall Financial Resource Strain (CARDIA)    Difficulty of Paying Living Expenses: Not hard at all  Food Insecurity: No Food Insecurity (01/12/2024)   Hunger Vital Sign    Worried About Running Out of Food in the Last Year: Never true    Ran Out of Food in the Last Year: Never true  Transportation Needs: No Transportation Needs (01/12/2024)   PRAPARE - Administrator, Civil Service (Medical): No    Lack of Transportation (Non-Medical): No  Physical Activity: Inactive (01/12/2024)   Exercise Vital Sign    Days of Exercise per Week: 0 days    Minutes of Exercise per Session: 0 min  Stress: Stress Concern Present (01/12/2024)   [employer] of  Occupational Health - Occupational Stress Questionnaire    Feeling of Stress : Very much  Social Connections: Socially Isolated (01/12/2024)   Social Connection and Isolation Panel    Frequency of Communication with Friends and Family: More than three times a week    Frequency of Social Gatherings with Friends and Family: Never    Attends Religious Services: Never    Database administrator or Organizations: No    Attends Banker Meetings: Never    Marital Status: Never married  Intimate Partner Violence: Not At Risk (01/12/2024)   Humiliation, Afraid, Rape, and Kick questionnaire    Fear of Current or Ex-Partner: No    Emotionally Abused: No    Physically Abused: No    Sexually Abused: No   Family History  Problem Relation Age of Onset   Diabetes Mother    Diabetes Daughter    Diabetes Daughter    Breast cancer Maternal Grandmother 10   Diabetes Maternal Grandmother    Diabetes Brother    Mental retardation Brother        died from PNA   Vitals  BP (!) 160/78   Pulse 83   SpO2 96%   Examination  Gen: no acute distress HEENT: [place]/AT, no scleral icterus, no pale conjunctivae, hearing normal, oral mucosa moist Neck: Supple Cardio: Regular rate and rhythm Resp: Pulmonary effort normal in room air.  GI: nondistended GU: Musc: Extremities: B/l AKA with well healed stump Skin: No rashes, Left UE AVG Ok with no sign of infection  Neuro: grossly non focal , awake, alert and oriented * 3  Psych: Calm, cooperative  Lab Results HIV 1 RNA Quant  Date Value  04/29/2023 <20 copies/mL  02/16/2023 <20 copies/mL  08/26/2022 Not Detected Copies/mL   HIV-1 RNA Viral Load (copies/mL)  Date Value  12/23/2017 80  02/02/2017 80   CD4 T Cell Abs (/uL)  Date Value  04/29/2023 313 (L)  02/16/2023 427  08/26/2022 397 (L)   No results found for: HIV1GENOSEQ Lab Results  Component Value Date   WBC 5.4 11/29/2023   HGB 10.4 (L) 11/29/2023   HCT 32.4 (L) 11/29/2023    MCV 92.3 11/29/2023   PLT 321 11/29/2023    Lab Results  Component Value Date   CREATININE 10.90 (H) 11/29/2023   BUN 54 (H) 11/29/2023   NA 128 (L) 11/29/2023   K 4.4 11/29/2023   CL 86 (L) 11/29/2023   CO2 22 11/29/2023   Lab Results  Component Value Date   ALT 7 11/27/2023  AST 12 (L) 11/27/2023   ALKPHOS 52 11/27/2023   BILITOT 0.6 11/27/2023    Lab Results  Component Value Date   CHOL 185 04/27/2023   TRIG 107 04/27/2023   HDL 51 04/27/2023   LDLCALC 113 (H) 04/27/2023   No results found for: HAV Lab Results  Component Value Date   HEPBSAG NON REACTIVE 11/28/2023   HEPBSAB Reactive (A) 11/10/2021   Lab Results  Component Value Date   HCVAB <0.1 05/03/2019   Lab Results  Component Value Date   CHLAMYDIAWP Negative 10/07/2016   N Negative 10/07/2016   No results found for: GCPROBEAPT No results found for: QUANTGOLD  Health Maintenance: Immunization History  Administered Date(s) Administered   Hepatitis B, ADULT 05/27/2016, 07/08/2016, 05/25/2019, 06/22/2019, 07/20/2019, 11/23/2019   Influenza, Quadrivalent, Recombinant, Inj, Pf 05/06/2019, 05/28/2022   Influenza, Seasonal, Injecte, Preservative Fre 05/10/2023   Influenza,inj,Quad PF,6+ Mos 04/21/2016, 05/17/2017, 10/09/2018, 05/06/2019, 05/29/2020, 04/22/2021   Meningococcal Mcv4o 07/08/2016, 10/07/2016   Moderna Sars-Covid-2 Vaccination 10/31/2019   Pneumococcal Conjugate-13 01/05/2018   Pneumococcal Polysaccharide-23 11/16/2013   Tdap 09/17/2017    Assessment/Plan: # HIV, well controlled  - Continue Biktarvy , meds refilled  - labs today  - fu in 6 months   # Anxiety/Depression - Follows with Psychiatry  - on escitalopram    # Anxiety/Depression  - on escitalopram , buspirone  - Fu with Psychiatry  # HTN/CHF/DM2 - on medications  - Follows PCP   # ESRD on HD - follows with Nephrology   # Immunization  - PCV 20 today  #Health maintenance 04/2016 Hep C negative  Hep B status  immune to Hepatitis B  Syphilis negative  TB testing quantiferon negative 04/23/2016 OI ppx if indicated None  HLA [id number] Neg 06/2018 Papsmear - HPV positive on 04/2018, due for repeat study and discussed to fu  Dental Care - dental clinic referral  previously done Mammogram and colonoscopy-  discussed to fu with PCP   Patient's labs were reviewed as well as his previous records. Patients questions were addressed and answered. Safe sex counseling done.  I spent 30 minutes involved in face-to-face and non-face-to-face activities for this patient on the day of the visit. Professional time spent includes the following activities: Preparing to see the patient (review of tests), Obtaining and  reviewing separately obtained history (admission/discharge record April 2025 and last PCP notes), Performing a medically appropriate examination and evaluation , Ordering medications/labs and vaccine, Documenting clinical information in the EMR, Independently interpreting results (not separately reported), Communicating results to the patient, Counseling and educating the patient and Care coordination (not separately reported).   Electronically signed by: Annalee Orem, MD Infectious Disease Physician [facility] [address] [place], [place] [address] Phone: [phone]  Fax: [phone]

## 2024-02-09 DIAGNOSIS — Z23 Encounter for immunization: Secondary | ICD-10-CM | POA: Insufficient documentation

## 2024-02-09 LAB — T-HELPER CELLS (CD4) COUNT (NOT AT ARMC)
CD4 % Helper T Cell: 40 % (ref 33–65)
CD4 T Cell Abs: 415 /uL (ref 400–1790)

## 2024-02-11 LAB — COMPREHENSIVE METABOLIC PANEL WITH GFR
AG Ratio: 1.1 (calc) (ref 1.0–2.5)
ALT: 4 U/L — ABNORMAL LOW (ref 6–29)
AST: 8 U/L — ABNORMAL LOW (ref 10–35)
Albumin: 3.9 g/dL (ref 3.6–5.1)
Alkaline phosphatase (APISO): 60 U/L (ref 31–125)
BUN/Creatinine Ratio: 3 (calc) — ABNORMAL LOW (ref 6–22)
BUN: 18 mg/dL (ref 7–25)
CO2: 33 mmol/L — ABNORMAL HIGH (ref 20–32)
Calcium: 9.9 mg/dL (ref 8.6–10.2)
Chloride: 94 mmol/L — ABNORMAL LOW (ref 98–110)
Creat: 5.45 mg/dL — ABNORMAL HIGH (ref 0.50–0.99)
Globulin: 3.7 g/dL (ref 1.9–3.7)
Glucose, Bld: 343 mg/dL — ABNORMAL HIGH (ref 65–99)
Potassium: 4 mmol/L (ref 3.5–5.3)
Sodium: 137 mmol/L (ref 135–146)
Total Bilirubin: 0.2 mg/dL (ref 0.2–1.2)
Total Protein: 7.6 g/dL (ref 6.1–8.1)
eGFR: 9 mL/min/1.73m2 — ABNORMAL LOW (ref >=60)

## 2024-02-11 LAB — HIV RNA, RTPCR W/R GT (RTI, PI,INT)
HIV 1 RNA Quant: NOT DETECTED {copies}/mL
HIV-1 RNA Quant, Log: NOT DETECTED {Log_copies}/mL

## 2024-02-11 LAB — LIPID PANEL
Cholesterol: 150 mg/dL (ref <200)
HDL: 49 mg/dL — ABNORMAL LOW (ref >=50)
LDL Cholesterol (Calc): 82 mg/dL
Non-HDL Cholesterol (Calc): 101 mg/dL (ref <130)
Total CHOL/HDL Ratio: 3.1 (calc) (ref <5.0)
Triglycerides: 93 mg/dL (ref <150)

## 2024-02-11 LAB — RPR: RPR Ser Ql: NONREACTIVE

## 2024-02-14 ENCOUNTER — Ambulatory Visit: Payer: Self-pay | Admitting: Internal Medicine

## 2024-02-15 ENCOUNTER — Other Ambulatory Visit: Payer: Self-pay | Admitting: Student

## 2024-02-15 ENCOUNTER — Other Ambulatory Visit: Payer: Self-pay

## 2024-02-15 MED ORDER — ATORVASTATIN CALCIUM 80 MG PO TABS: 80.0000 mg | ORAL_TABLET | Freq: Every day | ORAL | 11 refills | Status: DC

## 2024-02-15 MED ORDER — ATORVASTATIN CALCIUM 80 MG PO TABS: 80.0000 mg | ORAL_TABLET | Freq: Every day | ORAL | 11 refills | Status: AC

## 2024-02-15 NOTE — Telephone Encounter (Signed)
 Medication sent to pharmacy

## 2024-02-15 NOTE — Telephone Encounter (Unsigned)
 Copied from CRM [id number]. Topic: Clinical - Medication Refill >> Feb 15, 2024 12:47 PM Susanna ORN wrote: Medication: atorvastatin  (LIPITOR ) 80 MG tablet  Has the patient contacted their pharmacy? Yes, pharmacy called requesting refill. (Agent: If no, request that the patient contact the pharmacy for the refill. If patient does not wish to contact the pharmacy document the reason why and proceed with request.) (Agent: If yes, when and what did the pharmacy advise?)  This is the patient's preferred pharmacy:  [facility] [place] [address] [place] [address] Phone: [phone] Fax: [phone]  Is this the correct pharmacy for this prescription? Yes If no, delete pharmacy and type the correct one.   Has the prescription been filled recently? Yes  Is the patient out of the medication? Yes  Has the patient been seen for an appointment in the last year OR does the patient have an upcoming appointment? Yes  Can we respond through MyChart? No  Agent: Please be advised that Rx refills may take up to 3 business days. We ask that you follow-up with your pharmacy.

## 2024-02-17 ENCOUNTER — Other Ambulatory Visit: Payer: Self-pay

## 2024-02-17 ENCOUNTER — Encounter (HOSPITAL_COMMUNITY): Payer: Self-pay

## 2024-02-17 NOTE — Progress Notes (Signed)
 Specialty Pharmacy Refill Coordination Note  Kathy Frank is a 48 y.o. female contacted today regarding refills of specialty medication(s) Bictegravir-Emtricitab-Tenofov (Biktarvy )   Patient requested Delivery   Delivery date: 02/21/24   Verified address: [address]   [place] [address]   Medication will be filled on 02/18/24.

## 2024-02-18 ENCOUNTER — Other Ambulatory Visit: Payer: Self-pay | Admitting: Student

## 2024-02-18 NOTE — Telephone Encounter (Unsigned)
 Copied from CRM [id number]. Topic: Clinical - Medication Refill >> Feb 18, 2024  9:09 AM Merlynn A wrote: Medication: carvedilol  (COREG ) 12.5 MG tablet  Has the patient contacted their pharmacy? Yes (Agent: If no, request that the patient contact the pharmacy for the refill. If patient does not wish to contact the pharmacy document the reason why and proceed with request.) (Agent: If yes, when and what did the pharmacy advise?)  Patient advised that medication was originally filled through hospital. Patient is asking that medication dosage be changed to 25mg  as this is what she's taking now not 12.5. If unable to fill for patient please contact to advise.   This is the patient's preferred pharmacy:  [facility] [address] [place], [place] [address] [phone]  Is this the correct pharmacy for this prescription? Yes If no, delete pharmacy and type the correct one.   Has the prescription been filled recently? No  Is the patient out of the medication? Yes  Has the patient been seen for an appointment in the last year OR does the patient have an upcoming appointment? Yes  Can we respond through MyChart? No  Agent: Please be advised that Rx refills may take up to 3 business days. We ask that you follow-up with your pharmacy.

## 2024-02-21 MED ORDER — CARVEDILOL 12.5 MG PO TABS: 12.5000 mg | ORAL_TABLET | Freq: Two times a day (BID) | ORAL | 0 refills | Status: DC

## 2024-02-22 ENCOUNTER — Telehealth: Payer: Self-pay | Admitting: *Deleted

## 2024-02-22 NOTE — Telephone Encounter (Signed)
 Call to [facility] spoke with Pharmacist.  Question was about patient's Carvedilol  dosing.  Verified with Pharmacist tahat prescription was sent in for the 12.5 tablets 1 twice a day.

## 2024-02-22 NOTE — Telephone Encounter (Signed)
 RTC to [facility] message left that the Clinics had returned their call about the patient's prescription. Copied from CRM [id number]. Topic: Clinical - Medication Question >> Feb 22, 2024 11:25 AM Alfonso ORN wrote: Reason for CRM: [facility] - [place] -callback #  [phone]  regarding  the carvedilol  (COREG ) 12.5 MG tablet , orginally the dosage was 25 twice aday  want confirm a dose decrease   [facility] [place] [address] [place] [address] Phone: [phone] Fax: [phone] Hours: Not open 24 hours

## 2024-02-23 ENCOUNTER — Other Ambulatory Visit: Payer: Self-pay | Admitting: Student

## 2024-03-01 ENCOUNTER — Other Ambulatory Visit: Payer: Self-pay

## 2024-03-01 MED ORDER — PENTIPS 32G X 4 MM MISC: 3 refills | Status: DC

## 2024-03-10 ENCOUNTER — Other Ambulatory Visit (HOSPITAL_COMMUNITY): Payer: Self-pay

## 2024-03-16 ENCOUNTER — Other Ambulatory Visit: Payer: Self-pay | Admitting: Student

## 2024-03-17 ENCOUNTER — Other Ambulatory Visit: Payer: Self-pay

## 2024-03-17 ENCOUNTER — Encounter (HOSPITAL_COMMUNITY): Payer: Self-pay

## 2024-03-21 ENCOUNTER — Other Ambulatory Visit: Payer: Self-pay

## 2024-03-23 ENCOUNTER — Other Ambulatory Visit: Payer: Self-pay

## 2024-04-04 ENCOUNTER — Other Ambulatory Visit: Payer: Self-pay | Admitting: Pharmacy Technician

## 2024-04-04 ENCOUNTER — Other Ambulatory Visit: Payer: Self-pay

## 2024-04-04 NOTE — Progress Notes (Signed)
 Specialty Pharmacy Refill Coordination Note  Kathy Frank is a 48 y.o. female contacted today regarding refills of specialty medication(s) Bictegravir-Emtricitab-Tenofov (Biktarvy )   Patient requested Delivery   Delivery date: 04/05/24   Verified address: [address]  [place]   Medication will be filled on 04/04/24.

## 2024-04-21 ENCOUNTER — Other Ambulatory Visit: Payer: Self-pay | Admitting: Student

## 2024-04-21 NOTE — Telephone Encounter (Signed)
 Medication sent to pharmacy

## 2024-04-27 ENCOUNTER — Other Ambulatory Visit: Payer: Self-pay

## 2024-04-27 ENCOUNTER — Other Ambulatory Visit: Payer: Self-pay | Admitting: Pharmacy Technician

## 2024-04-27 NOTE — Progress Notes (Signed)
 Specialty Pharmacy Refill Coordination Note  Kathy Frank is a 48 y.o. female contacted today regarding refills of specialty medication(s) Bictegravir-Emtricitab-Tenofov (Biktarvy )   Patient requested Delivery   Delivery date: 05/03/24   Verified address: [address]  [place]   Medication will be filled on 05/02/24.

## 2024-05-23 ENCOUNTER — Other Ambulatory Visit: Payer: Self-pay | Admitting: Student

## 2024-05-25 ENCOUNTER — Other Ambulatory Visit: Payer: Self-pay

## 2024-05-25 ENCOUNTER — Encounter (HOSPITAL_COMMUNITY): Payer: Self-pay | Admitting: Vascular Surgery

## 2024-05-25 ENCOUNTER — Encounter (HOSPITAL_COMMUNITY): Payer: Self-pay

## 2024-05-25 NOTE — Progress Notes (Signed)
 Specialty Pharmacy Refill Coordination Note  Kathy Frank is a 48 y.o. female contacted today regarding refills of specialty medication(s) Bictegravir-Emtricitab-Tenofov (Biktarvy )   Patient requested Delivery   Delivery date: 05/29/24   Verified address: [address]  [place]   Medication will be filled on 05/26/24.

## 2024-05-31 ENCOUNTER — Telehealth: Payer: Self-pay | Admitting: Student

## 2024-05-31 ENCOUNTER — Encounter (HOSPITAL_COMMUNITY): Admission: RE | Payer: Self-pay | Source: Home / Self Care

## 2024-05-31 ENCOUNTER — Ambulatory Visit (HOSPITAL_COMMUNITY): Admission: RE | Admit: 2024-05-31 | Source: Home / Self Care | Admitting: Vascular Surgery

## 2024-05-31 SURGERY — A/V FISTULAGRAM
Anesthesia: LOCAL | Site: Arm Upper | Laterality: Left

## 2024-05-31 MED ORDER — ESCITALOPRAM OXALATE 10 MG PO TABS: 10.0000 mg | ORAL_TABLET | Freq: Every day | ORAL | 0 refills | Status: DC

## 2024-05-31 NOTE — Telephone Encounter (Unsigned)
 Copied from CRM #[id number]. Topic: Clinical - Medication Refill >> May 31, 2024 10:15 AM Debby BROCKS wrote: Medication: escitalopram  (LEXAPRO ) 10 MG tablet  Has the patient contacted their pharmacy? Yes (Agent: If no, request that the patient contact the pharmacy for the refill. If patient does not wish to contact the pharmacy document the reason why and proceed with request.) (Agent: If yes, when and what did the pharmacy advise?) The pharmacy is the one putting in the request  This is the patient's preferred pharmacy:  [facility] [place] [address] [place] [address] Phone: [phone] Fax: [phone]  Is this the correct pharmacy for this prescription? Yes If no, delete pharmacy and type the correct one.   Has the prescription been filled recently? No  Is the patient out of the medication? Yes  Has the patient been seen for an appointment in the last year OR does the patient have an upcoming appointment? Yes  Can we respond through MyChart? Yes  Agent: Please be advised that Rx refills may take up to 3 business days. We ask that you follow-up with your pharmacy.

## 2024-06-06 ENCOUNTER — Other Ambulatory Visit: Payer: Self-pay

## 2024-06-06 ENCOUNTER — Emergency Department (HOSPITAL_COMMUNITY)
Admission: EM | Admit: 2024-06-06 | Discharge: 2024-06-07 | Disposition: A | Discharge status: Home / Self Care | Source: Home / Self Care | Attending: Emergency Medicine | Admitting: Emergency Medicine

## 2024-06-06 ENCOUNTER — Emergency Department (HOSPITAL_COMMUNITY)

## 2024-06-06 ENCOUNTER — Encounter (HOSPITAL_COMMUNITY): Payer: Self-pay

## 2024-06-06 DIAGNOSIS — Z21 Asymptomatic human immunodeficiency virus [HIV] infection status: Secondary | ICD-10-CM | POA: Insufficient documentation

## 2024-06-06 DIAGNOSIS — E1122 Type 2 diabetes mellitus with diabetic chronic kidney disease: Secondary | ICD-10-CM | POA: Insufficient documentation

## 2024-06-06 DIAGNOSIS — Y832 Surgical operation with anastomosis, bypass or graft as the cause of abnormal reaction of the patient, or of later complication, without mention of misadventure at the time of the procedure: Secondary | ICD-10-CM | POA: Diagnosis not present

## 2024-06-06 DIAGNOSIS — Z992 Dependence on renal dialysis: Secondary | ICD-10-CM | POA: Insufficient documentation

## 2024-06-06 DIAGNOSIS — Z794 Long term (current) use of insulin: Secondary | ICD-10-CM | POA: Insufficient documentation

## 2024-06-06 DIAGNOSIS — R112 Nausea with vomiting, unspecified: Secondary | ICD-10-CM | POA: Insufficient documentation

## 2024-06-06 DIAGNOSIS — Z87891 Personal history of nicotine dependence: Secondary | ICD-10-CM | POA: Diagnosis not present

## 2024-06-06 DIAGNOSIS — R0602 Shortness of breath: Secondary | ICD-10-CM | POA: Diagnosis not present

## 2024-06-06 DIAGNOSIS — Z89611 Acquired absence of right leg above knee: Secondary | ICD-10-CM | POA: Diagnosis not present

## 2024-06-06 DIAGNOSIS — I252 Old myocardial infarction: Secondary | ICD-10-CM | POA: Diagnosis not present

## 2024-06-06 DIAGNOSIS — R1030 Lower abdominal pain, unspecified: Secondary | ICD-10-CM | POA: Insufficient documentation

## 2024-06-06 DIAGNOSIS — I120 Hypertensive chronic kidney disease with stage 5 chronic kidney disease or end stage renal disease: Secondary | ICD-10-CM | POA: Diagnosis not present

## 2024-06-06 DIAGNOSIS — I700 Atherosclerosis of aorta: Secondary | ICD-10-CM | POA: Diagnosis not present

## 2024-06-06 DIAGNOSIS — Z79899 Other long term (current) drug therapy: Secondary | ICD-10-CM | POA: Diagnosis not present

## 2024-06-06 DIAGNOSIS — R079 Chest pain, unspecified: Secondary | ICD-10-CM | POA: Insufficient documentation

## 2024-06-06 DIAGNOSIS — N186 End stage renal disease: Secondary | ICD-10-CM | POA: Insufficient documentation

## 2024-06-06 DIAGNOSIS — T82858A Stenosis of vascular prosthetic devices, implants and grafts, initial encounter: Secondary | ICD-10-CM | POA: Diagnosis present

## 2024-06-06 DIAGNOSIS — Z89612 Acquired absence of left leg above knee: Secondary | ICD-10-CM | POA: Diagnosis not present

## 2024-06-06 LAB — BASIC METABOLIC PANEL WITH GFR
Anion gap: 16 — ABNORMAL HIGH (ref 5–15)
BUN: 24 mg/dL — ABNORMAL HIGH (ref 6–20)
CO2: 25 mmol/L (ref 22–32)
Calcium: 9.6 mg/dL (ref 8.9–10.3)
Chloride: 94 mmol/L — ABNORMAL LOW (ref 98–111)
Creatinine, Ser: 5.54 mg/dL — ABNORMAL HIGH (ref 0.44–1.00)
GFR, Estimated: 9 mL/min — ABNORMAL LOW (ref >60)
Glucose, Bld: 212 mg/dL — ABNORMAL HIGH (ref 70–99)
Potassium: 3.7 mmol/L (ref 3.5–5.1)
Sodium: 135 mmol/L (ref 135–145)

## 2024-06-06 LAB — RESP PANEL BY RT-PCR (RSV, FLU A&B, COVID)  RVPGX2
Influenza A by PCR: NEGATIVE
Influenza B by PCR: NEGATIVE
Resp Syncytial Virus by PCR: NEGATIVE
SARS Coronavirus 2 by RT PCR: NEGATIVE

## 2024-06-06 LAB — CBC
HCT: 36.6 % (ref 36.0–46.0)
Hemoglobin: 11.7 g/dL — ABNORMAL LOW (ref 12.0–15.0)
MCH: 29.7 pg (ref 26.0–34.0)
MCHC: 32 g/dL (ref 30.0–36.0)
MCV: 92.9 fL (ref 80.0–100.0)
Platelets: 230 K/uL (ref 150–400)
RBC: 3.94 MIL/uL (ref 3.87–5.11)
RDW: 15.1 % (ref 11.5–15.5)
WBC: 7 K/uL (ref 4.0–10.5)
nRBC: 0 % (ref 0.0–0.2)

## 2024-06-06 LAB — TROPONIN I (HIGH SENSITIVITY)
Troponin I (High Sensitivity): 36 ng/L — ABNORMAL HIGH (ref <18)
Troponin I (High Sensitivity): 37 ng/L — ABNORMAL HIGH (ref <18)

## 2024-06-06 LAB — HEPATIC FUNCTION PANEL
ALT: 11 U/L (ref 0–44)
AST: 15 U/L (ref 15–41)
Albumin: 3.4 g/dL — ABNORMAL LOW (ref 3.5–5.0)
Alkaline Phosphatase: 52 U/L (ref 38–126)
Bilirubin, Direct: <0.1 mg/dL (ref 0.0–0.2)
Total Bilirubin: 0.7 mg/dL (ref 0.0–1.2)
Total Protein: 8.4 g/dL — ABNORMAL HIGH (ref 6.5–8.1)

## 2024-06-06 LAB — LIPASE, BLOOD: Lipase: 32 U/L (ref 11–51)

## 2024-06-06 LAB — HCG, SERUM, QUALITATIVE: Preg, Serum: NEGATIVE

## 2024-06-06 MED ORDER — HALOPERIDOL LACTATE 5 MG/ML IJ SOLN
2.0000 mg | Freq: Once | INTRAMUSCULAR | Status: AC
  Administered 2024-06-06: 2 mg via INTRAVENOUS
  Filled 2024-06-06: qty 1

## 2024-06-06 MED ORDER — OXYCODONE-ACETAMINOPHEN 5-325 MG PO TABS
1.0000 | ORAL_TABLET | Freq: Once | ORAL | Status: AC
  Administered 2024-06-06: 1 via ORAL
  Filled 2024-06-06: qty 1

## 2024-06-06 MED ORDER — ONDANSETRON 4 MG PO TBDP
4.0000 mg | ORAL_TABLET | Freq: Once | ORAL | Status: AC
  Administered 2024-06-06: 4 mg via ORAL
  Filled 2024-06-06: qty 1

## 2024-06-06 NOTE — ED Triage Notes (Signed)
 Pt c/o cp and sob x 2 days; denies fevers; dialysis pt, received full treatment today

## 2024-06-06 NOTE — ED Provider Triage Note (Signed)
 Emergency Medicine Provider Triage Evaluation Note  Kathy Frank , a 48 y.o. female  was evaluated in triage.  Pt complains of CP and SOB, nausea and vomiting began today.   Full session of dialysis today.   Has been coughing. Has abd pain as well. No fevers.   Makes a little urine.   Review of Systems  Positive: CP/SOB Negative: Fever   Physical Exam  BP (!) 176/76 (BP Location: Right Arm)   Pulse 83   Temp 99 F (37.2 C)   Resp 17   SpO2 97%  Gen:   Awake, no distress   Resp:  Normal effort  MSK:   Moves extremities without difficulty  Other:  Abd soft but mildly ttp to the lower abd  Medical Decision Making  Medically screening exam initiated at 6:49 PM.  Appropriate orders placed.  Kathy Frank was informed that the remainder of the evaluation will be completed by another provider, this initial triage assessment does not replace that evaluation, and the importance of remaining in the ED until their evaluation is complete.  Labs, LFTs and Lipase added.    Kathy Frank, Kathy Frank 06/06/24 [phone]

## 2024-06-06 NOTE — ED Notes (Signed)
 Patient transported to X-ray

## 2024-06-06 NOTE — ED Provider Notes (Signed)
 [place] [facility] Provider Note   CSN: [id number] Arrival date & time: 06/06/24  1825     Patient presents with: Chest Pain   Kathy Frank is a 48 y.o. female.  With a history of ESRD on dialysis (TTS), type 2 diabetes, bilateral AKA's NSTEMI and HIV on Biktarvy  who presents to the ED for chest pain.  Patient underwent full dialysis treatment today.  For the last 2 days she has had some chest pain shortness of breath along with nausea.  2 episodes of vomiting here.  No abdominal pain fevers chills  {Add pertinent medical, surgical, social history, OB history to HPI:32947}  Chest Pain      Prior to Admission medications   Medication Sig Start Date End Date Taking? Authorizing Provider  Accu-Chek Softclix Lancets lancets Use as instructed 05/02/21   Atway, Rayann N, DO  albuterol  (VENTOLIN  HFA) 108 (90 Base) MCG/ACT inhaler Inhale 2 puffs into the lungs every 4 (four) hours as needed for wheezing or shortness of breath. 12/06/23   Tawkaliyar, Roya, DO  amLODipine -olmesartan  (AZOR ) 10-40 MG tablet Take 1 tablet by mouth daily. 11/19/23   Tawkaliyar, Roya, DO  aspirin  EC (EQ ASPIRIN  ADULT LOW DOSE) 81 MG tablet Take 1 tablet (81 mg total) by mouth daily. Swallow whole. 04/29/23   Alexander-Savino, Washington, MD  atorvastatin  (LIPITOR ) 80 MG tablet Take 1 tablet (80 mg total) by mouth daily. 02/15/24   Tawkaliyar, Roya, DO  bictegravir-emtricitabine -tenofovir  AF (BIKTARVY ) 50-200-25 MG TABS tablet Take 1 tablet by mouth daily. Try to take at the same time each day with or without food. 02/08/24   Manandhar, Sabina, MD  busPIRone  (BUSPAR ) 5 MG tablet Take 2.5 mg by mouth 2 (two) times daily. Take 1/2 tablet twice a day at 9am & 5pm.    [provider]  calcitRIOL  (ROCALTROL ) 0.25 MCG capsule Take 5 capsules (1.25 mcg total) by mouth Every Tuesday,Thursday,and Saturday with dialysis. 04/29/23   Alexander-Savino, Washington, MD  calcium  acetate (PHOSLO ) 667  MG capsule Take 2 capsules (1,334 mg total) by mouth 3 (three) times daily with meals. 04/29/23   Alexander-Savino, Washington, MD  carvedilol  (COREG ) 12.5 MG tablet TAKE ONE TABLET (12.5 MG TOTAL) BY MOUTH TWICE DAILY AT 9AM & 5PM WITH A MEAL 04/21/24   Tawkaliyar, Roya, DO  Continuous Blood Gluc Sensor (DEXCOM G7 SENSOR) MISC Use to check blood sugar continuously 06/30/22   Franchot Novel, MD  cyclobenzaprine  (FLEXERIL ) 5 MG tablet Take 1 tablet (5 mg total) by mouth 2 (two) times daily as needed for muscle spasms. 07/26/23   Tobie Gaines, DO  escitalopram  (LEXAPRO ) 10 MG tablet Take 1 tablet (10 mg total) by mouth daily. 05/31/24   Tawkaliyar, Roya, DO  glucose blood test strip Use as instructed 05/02/21   Atway, Rayann N, DO  insulin  glargine (LANTUS  SOLOSTAR) 100 UNIT/ML Solostar Pen Inject 15 Units into the skin at bedtime. 09/22/23   Tawkaliyar, Roya, DO  Insulin  Pen Needle (PENTIPS) 32G X 4 MM MISC Use once daily with insulin  and once daily with victoza  03/01/24   Tawkaliyar, Roya, DO  liraglutide  (VICTOZA ) 18 MG/3ML SOPN Inject 0.6 mg into the skin daily. 03/02/23   Amoako, Prince, MD  LOKELMA  5 g packet Take 5 g (1 packet total) by mouth daily. 11/29/23   Arrien, Mauricio Daniel, MD  multivitamin (RENA-VIT) TABS tablet Take 1 tablet by mouth daily. Patient taking differently: Take 1 tablet by mouth See admin instructions. On Tuesday, Wednesday and  Thursday 02/14/21   Pegge Toribio PARAS, PA-C  psyllium (HYDROCIL/METAMUCIL) 95 % PACK Take 1 packet by mouth daily. 04/29/23   Alexander-Savino, Washington, MD  rOPINIRole  (REQUIP ) 0.25 MG tablet Take 0.25 mg by mouth at bedtime. 11/17/23   [provider]  UBRELVY  50 MG TABS TAKE ONE TABLET (50 MG TOTAL) BY MOUTH AS NEEDED FOR MIGRAINE. IF NEEDED A SECOND DOSE MAY BE TAKEN AT LEAST 2 HOURS AFTER THE INITIAL DOSE. MAX 8 DOSES IN A MONTH 12/27/23   Tawkaliyar, Roya, DO    Allergies: Patient has no known allergies.    Review of Systems   Cardiovascular:  Positive for chest pain.    Updated Vital Signs BP (!) 158/84   Pulse 82   Temp 99 F (37.2 C)   Resp 16   Ht 5' (1.524 m)   Wt 77 kg   SpO2 95%   BMI 33.15 kg/m   Physical Exam Vitals and nursing note reviewed.  HENT:     Head: Normocephalic and atraumatic.  Eyes:     Pupils: Pupils are equal, round, and reactive to light.  Cardiovascular:     Rate and Rhythm: Normal rate and regular rhythm.  Pulmonary:     Effort: Pulmonary effort is normal.     Breath sounds: Normal breath sounds.  Abdominal:     Palpations: Abdomen is soft.     Tenderness: There is no abdominal tenderness.     Comments: Mild lower abdominal tenderness without rebound rigidity guarding  Skin:    General: Skin is warm and dry.  Neurological:     Mental Status: She is alert.  Psychiatric:        Mood and Affect: Mood normal.     (all labs ordered are listed, but only abnormal results are displayed) Labs Reviewed  BASIC METABOLIC PANEL WITH GFR - Abnormal; Notable for the following components:      Result Value   Chloride 94 (*)    Glucose, Bld 212 (*)    BUN 24 (*)    Creatinine, Ser 5.54 (*)    GFR, Estimated 9 (*)    Anion gap 16 (*)    All other components within normal limits  CBC - Abnormal; Notable for the following components:   Hemoglobin 11.7 (*)    All other components within normal limits  TROPONIN I (HIGH SENSITIVITY) - Abnormal; Notable for the following components:   Troponin I (High Sensitivity) 36 (*)    All other components within normal limits  RESP PANEL BY RT-PCR (RSV, FLU A&B, COVID)  RVPGX2  HCG, SERUM, QUALITATIVE  HEPATIC FUNCTION PANEL  LIPASE, BLOOD  TROPONIN I (HIGH SENSITIVITY)    EKG: EKG Interpretation Date/Time:  Tuesday June 06 2024 18:55:02 EDT Ventricular Rate:  80 PR Interval:  150 QRS Duration:  104 QT Interval:  424 QTC Calculation: 489 R Axis:   62  Text Interpretation: Normal sinus rhythm Left ventricular hypertrophy  with repolarization abnormality ( Cornell product ) Prolonged QT Abnormal ECG When compared with ECG of 27-Nov-2023 09:51, PREVIOUS ECG IS PRESENT Confirmed by Pamella Sharper [phone]) on 06/06/2024 8:22:52 PM  Radiology: DG Chest 2 View Result Date: 06/06/2024 EXAM: 2 VIEW(S) XRAY OF THE CHEST 06/06/2024 07:37:00 PM COMPARISON: 11/27/2023 CLINICAL HISTORY: cp/sob. Pt c/o cp and sob x 2 days; denies fevers; dialysis pt, received full treatment today. FINDINGS: LUNGS AND PLEURA: No focal pulmonary opacity. No pulmonary edema. No pleural effusion. No pneumothorax. HEART AND MEDIASTINUM: Enlarged cardiac silhouette. Calcified  aorta. BONES AND SOFT TISSUES: No acute osseous abnormality. IMPRESSION: 1. Enlarged cardiac silhouette. 2. Lungs are clear Electronically signed by: Fonda Field MD 06/06/2024 07:43 PM EDT RP Workstation: [id number]    {Document cardiac monitor, telemetry assessment procedure when appropriate:32947} Procedures   Medications Ordered in the ED  haloperidol  lactate (HALDOL ) injection 2 mg (has no administration in time range)  oxyCODONE -acetaminophen  (PERCOCET/ROXICET) 5-325 MG per tablet 1 tablet (1 tablet Kathy Given 06/06/24 1925)  ondansetron  (ZOFRAN -ODT) disintegrating tablet 4 mg (4 mg Kathy Given 06/06/24 1925)      {Click here for ABCD2, HEART and other calculators REFRESH Note before signing:1}                              Medical Decision Making 48 year old female with history presenting to ED for chest pain shortness of breath also nausea and vomiting.  Chest pain shortness of breath for 2 days nausea and vomiting started today.  Some lower abdominal pain.  Completed full session of dialysis.  No evidence of acute volume overload.  Afebrile well-appearing but does have some lower abdominal tenderness.  Felt a little bit better after Zofran  but nausea and vomiting has returned.  No leukocytosis or focal consolidation on chest x-ray.  Will obtain CT abdomen pelvis to  look for acute intra-abdominal infection.  Is compliant with Biktarvy .  Last viral load undetectable.  Last CD4 count in July was in the 400s.  Amount and/or Complexity of Data Reviewed Labs: ordered. Radiology: ordered.  Risk Prescription drug management.     {Document critical care time when appropriate  Document review of labs and clinical decision tools ie CHADS2VASC2, etc  Document your independent review of radiology images and any outside records  Document your discussion with family members, caretakers and with consultants  Document social determinants of health affecting pt's care  Document your decision making why or why not admission, treatments were needed:32947:::1}   Final diagnoses:  None    ED Discharge Orders     None

## 2024-06-07 ENCOUNTER — Encounter (HOSPITAL_COMMUNITY): Payer: Self-pay | Admitting: Vascular Surgery

## 2024-06-07 ENCOUNTER — Other Ambulatory Visit: Payer: Self-pay

## 2024-06-07 ENCOUNTER — Ambulatory Visit (HOSPITAL_COMMUNITY)
Admission: RE | Admit: 2024-06-07 | Discharge: 2024-06-07 | Disposition: A | Discharge status: Home / Self Care | Attending: Vascular Surgery | Admitting: Vascular Surgery

## 2024-06-07 ENCOUNTER — Encounter (HOSPITAL_COMMUNITY): Admission: RE | Disposition: A | Payer: Self-pay | Source: Home / Self Care | Attending: Vascular Surgery

## 2024-06-07 DIAGNOSIS — Z89612 Acquired absence of left leg above knee: Secondary | ICD-10-CM | POA: Insufficient documentation

## 2024-06-07 DIAGNOSIS — T82858A Stenosis of vascular prosthetic devices, implants and grafts, initial encounter: Secondary | ICD-10-CM | POA: Diagnosis not present

## 2024-06-07 DIAGNOSIS — N186 End stage renal disease: Secondary | ICD-10-CM | POA: Insufficient documentation

## 2024-06-07 DIAGNOSIS — Z89611 Acquired absence of right leg above knee: Secondary | ICD-10-CM | POA: Insufficient documentation

## 2024-06-07 DIAGNOSIS — Z992 Dependence on renal dialysis: Secondary | ICD-10-CM | POA: Insufficient documentation

## 2024-06-07 DIAGNOSIS — R0602 Shortness of breath: Secondary | ICD-10-CM | POA: Insufficient documentation

## 2024-06-07 DIAGNOSIS — I700 Atherosclerosis of aorta: Secondary | ICD-10-CM | POA: Insufficient documentation

## 2024-06-07 DIAGNOSIS — E1122 Type 2 diabetes mellitus with diabetic chronic kidney disease: Secondary | ICD-10-CM | POA: Insufficient documentation

## 2024-06-07 DIAGNOSIS — I252 Old myocardial infarction: Secondary | ICD-10-CM | POA: Insufficient documentation

## 2024-06-07 DIAGNOSIS — Z87891 Personal history of nicotine dependence: Secondary | ICD-10-CM | POA: Insufficient documentation

## 2024-06-07 DIAGNOSIS — I120 Hypertensive chronic kidney disease with stage 5 chronic kidney disease or end stage renal disease: Secondary | ICD-10-CM | POA: Insufficient documentation

## 2024-06-07 DIAGNOSIS — R079 Chest pain, unspecified: Secondary | ICD-10-CM | POA: Insufficient documentation

## 2024-06-07 DIAGNOSIS — Z21 Asymptomatic human immunodeficiency virus [HIV] infection status: Secondary | ICD-10-CM | POA: Insufficient documentation

## 2024-06-07 DIAGNOSIS — Z79899 Other long term (current) drug therapy: Secondary | ICD-10-CM | POA: Insufficient documentation

## 2024-06-07 DIAGNOSIS — Y832 Surgical operation with anastomosis, bypass or graft as the cause of abnormal reaction of the patient, or of later complication, without mention of misadventure at the time of the procedure: Secondary | ICD-10-CM | POA: Insufficient documentation

## 2024-06-07 LAB — GLUCOSE, CAPILLARY: Glucose-Capillary: 191 mg/dL — ABNORMAL HIGH (ref 70–99)

## 2024-06-07 MED ORDER — LIDOCAINE HCL (PF) 1 % IJ SOLN
INTRAMUSCULAR | Status: DC | PRN
  Administered 2024-06-07: 5 mL

## 2024-06-07 MED ORDER — ONDANSETRON HCL 4 MG PO TABS
4.0000 mg | ORAL_TABLET | Freq: Three times a day (TID) | ORAL | 0 refills | Status: AC | PRN
Start: 2024-06-07 — End: 2024-06-10

## 2024-06-07 MED ORDER — HEPARIN (PORCINE) IN NACL 1000-0.9 UT/500ML-% IV SOLN
INTRAVENOUS | Status: DC | PRN
  Administered 2024-06-07: 500 mL

## 2024-06-07 MED ORDER — IODIXANOL 320 MG/ML IV SOLN
INTRAVENOUS | Status: DC | PRN
  Administered 2024-06-07: 25 mL via INTRAVENOUS

## 2024-06-07 NOTE — H&P (Signed)
 HD ACCESS CENTER H&P   Patient ID: KIMI BORDEAU, female   DOB: January 30, 1976, 48 y.o.   MRN: [id number]  Subjective:     HPI ALKA FALWELL is a 48 y.o. female with ESRD presenting to the HD access center for intervention.  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   Anemia, posthemorrhagic, acute 10/14/2022   Anxiety    CAD (coronary artery disease)    CAP (community acquired pneumonia) 09/29/2022   CVA (cerebral vascular accident) [facility])    Depression 06/28/2006   Qualifier: Diagnosis of  By: Pati MD, Todd     Diabetes mellitus type 2 in obese 06/28/1994   Dyspnea    uses oxygeb 2 liters per minute at dialysis   End stage renal disease on dialysis [facility])    on hemodialysis T/Th/Sat   Erosive esophagitis    Esophageal reflux    Eye redness    Fecal incontinence 02/04/2018   Gastroparesis    ? diabetic   GERD (gastroesophageal reflux disease)    Human immunodeficiency virus (HIV) disease (HCC) 04/23/2016   Hyperkalemia 10/12/2022   Hyperlipidemia    Hypertension    Hypertensive emergency 11/09/2021   Hypertensive urgency 05/20/2021   Metabolic bone disease 05/02/2019   Moderate nonproliferative diabetic retinopathy of both eyes (HCC) 11/21/2014   11/14/14: Noted on retinal imaging; needs follow-up imaging in 6 months  05/22/16: Noted on retinal imaging again; needs follow-up imaging in 6 months   PAD (peripheral artery disease)    Type 2 diabetes mellitus with diabetic peripheral angiopathy without gangrene (HCC) 05/01/2019   Wound infection s/p L transmetatarsal amputation    Family History  Problem Relation Age of Onset   Diabetes Mother    Diabetes Daughter    Diabetes Daughter    Breast cancer Maternal Grandmother 45   Diabetes Maternal Grandmother    Diabetes Brother    Mental retardation Brother        died from PNA   Past Surgical History:  Procedure Laterality Date    A/V SHUNTOGRAM Left 04/21/2023   Procedure: A/V SHUNTOGRAM;  Surgeon: Lanis Fonda BRAVO, MD;  Location: [facility] INVASIVE CV LAB;  Service: Cardiovascular;  Laterality: Left;   ABDOMINAL AORTOGRAM W/LOWER EXTREMITY N/A 12/13/2020   Procedure: ABDOMINAL AORTOGRAM W/LOWER EXTREMITY;  Surgeon: Eliza Lonni RAMAN, MD;  Location: [facility] INVASIVE CV LAB;  Service: Cardiovascular;  Laterality: N/A;   AMPUTATION Left 07/08/2018   Procedure: AMPUTATION FORTH RAY LEFT FOOT;  Surgeon: Harden Jerona GAILS, MD;  Location: [facility] OR;  Service: Orthopedics;  Laterality: Left;   AMPUTATION Left 08/09/2018   Procedure: Left Transmetatarsal Amputation;  Surgeon: Harden Jerona GAILS, MD;  Location: [facility] OR;  Service: Orthopedics;  Laterality: Left;   AMPUTATION Left 10/08/2018   Procedure: LEFT BELOW KNEE AMPUTATION;  Surgeon: Harden Jerona GAILS, MD;  Location: [facility] OR;  Service: Orthopedics;  Laterality: Left;   AMPUTATION Left 10/28/2018   Procedure: REVISION BELOW KNEE AMPUTATION;  Surgeon: Harden Jerona GAILS, MD;  Location: [facility] OR;  Service: Orthopedics;  Laterality: Left;   AMPUTATION Left 12/16/2018   Procedure: LEFT ABOVE KNEE AMPUTATION;  Surgeon: Harden Jerona GAILS, MD;  Location: [facility] OR;  Service: Orthopedics;  Laterality: Left;   AMPUTATION Right 01/31/2021   Procedure: AMPUTATION BELOW KNEE;  Surgeon: Serene Gaile ORN, MD;  Location: [facility] OR;  Service: Vascular;  Laterality: Right;   AMPUTATION Right 03/28/2021   Procedure: REVISION OF  RIGHT BELOW KNEE AMPUTATION;  Surgeon: Serene Gaile ORN, MD;  Location: [facility] OR;  Service: Vascular;  Laterality: Right;   AMPUTATION Right 04/18/2021   Procedure: right below knee amputation/washout placement wound vac;  Surgeon: Magda Debby SAILOR, MD;  Location: [facility] OR;  Service: Vascular;  Laterality: Right;   AMPUTATION Right 02/19/2023   Procedure: CONVERSION TO ABOVE KNEE AMPUTATION;  Surgeon: Sheree Penne Bruckner, MD;  Location: [facility] OR;  Service: Vascular;  Laterality: Right;   APPLICATION OF WOUND  VAC Left 12/31/2022   Procedure: APPLICATION OF PREVENA WOUND VAC;  Surgeon: Lanis Fonda BRAVO, MD;  Location: [facility] OR;  Service: Vascular;  Laterality: Left;   AV FISTULA PLACEMENT Left 10/14/2018   Procedure: Arteriovenous (Av) Fistula Creation Left Arm;  Surgeon: Gretta Bruckner PARAS, MD;  Location: [facility] OR;  Service: Vascular;  Laterality: Left;   BASCILIC VEIN TRANSPOSITION Left 04/14/2019   Procedure: BASILIC VEIN TRANSPOSITION SECOND STAGE LEFT ARM;  Surgeon: Oris Krystal FALCON, MD;  Location: [facility] OR;  Service: Vascular;  Laterality: Left;   BIOPSY  10/14/2022   Procedure: BIOPSY;  Surgeon: Leigh Elspeth SQUIBB, MD;  Location: [facility] ENDOSCOPY;  Service: Gastroenterology;;   COMPLEX WOUND CLOSURE Left 12/31/2022   Procedure: COMPLEX WOUND CLOSURE;  Surgeon: Lanis Fonda BRAVO, MD;  Location: [facility] OR;  Service: Vascular;  Laterality: Left;   CORONARY STENT INTERVENTION N/A 05/21/2021   Procedure: CORONARY STENT INTERVENTION;  Surgeon: Anner Alm ORN, MD;  Location: [facility] INVASIVE CV LAB;  Service: Cardiovascular;  Laterality: N/A;   ESOPHAGOGASTRODUODENOSCOPY (EGD) WITH PROPOFOL  N/A 10/14/2022   Procedure: ESOPHAGOGASTRODUODENOSCOPY (EGD) WITH PROPOFOL ;  Surgeon: Leigh Elspeth SQUIBB, MD;  Location: [facility]OS[facility];  Service: Gastroenterology;  Laterality: N/A;   HEMOSTASIS CLIP PLACEMENT  10/14/2022   Procedure: HEMOSTASIS CLIP PLACEMENT;  Surgeon: Leigh Elspeth SQUIBB, MD;  Location: [facility]OS[facility];  Service: Gastroenterology;;   INCISION AND DRAINAGE Left 12/31/2022   Procedure: INCISION AND DRAINAGE OF LEFT ARM;  Surgeon: Lanis Fonda BRAVO, MD;  Location: [facility] OR;  Service: Vascular;  Laterality: Left;   INCISION AND DRAINAGE ABSCESS N/A 10/24/2020   Procedure: exicision of hydradenitis;  Surgeon: Debby Hila, MD;  Location: [facility];  Service: General;  Laterality: N/A;  45 min   INSERTION OF DIALYSIS CATHETER Right 04/14/2019   Procedure: INSERTION OF DIALYSIS CATHETER;  Surgeon: Oris Krystal FALCON, MD;  Location: [facility] OR;  Service: Vascular;  Laterality: Right;   IR FLUORO GUIDE CV LINE RIGHT  10/13/2022   IR REMOVAL TUN CV CATH W/O FL  10/16/2022   IR US  GUIDE VASC ACCESS RIGHT  10/13/2022   LEFT HEART CATH AND CORONARY ANGIOGRAPHY N/A 05/21/2021   Procedure: LEFT HEART CATH AND CORONARY ANGIOGRAPHY;  Surgeon: Anner Alm ORN, MD;  Location: [facility] INVASIVE CV LAB;  Service: Cardiovascular;  Laterality: N/A;   LOWER EXTREMITY ANGIOGRAPHY N/A 07/05/2018   Procedure: LOWER EXTREMITY ANGIOGRAPHY;  Surgeon: Serene Gaile ORN, MD;  Location: [facility] INVASIVE CV LAB;  Service: Cardiovascular;  Laterality: N/A;   PERIPHERAL VASCULAR ATHERECTOMY Right 12/13/2020   Procedure: PERIPHERAL VASCULAR ATHERECTOMY;  Surgeon: Eliza Bruckner RAMAN, MD;  Location: [facility] INVASIVE CV LAB;  Service: Cardiovascular;  Laterality: Right;  Superficial femoral   PERIPHERAL VASCULAR BALLOON ANGIOPLASTY Left 07/05/2018   Procedure: PERIPHERAL VASCULAR BALLOON ANGIOPLASTY;  Surgeon: Serene Gaile ORN, MD;  Location: [facility] INVASIVE CV LAB;  Service: Cardiovascular;  Laterality: Left;  SFA   PERIPHERAL VASCULAR BALLOON ANGIOPLASTY Right 12/13/2020   Procedure: PERIPHERAL VASCULAR BALLOON ANGIOPLASTY;  Surgeon:  Eliza Lonni RAMAN, MD;  Location: [facility] INVASIVE CV LAB;  Service: Cardiovascular;  Laterality: Right;  Peroneal artery, anterior tibial artery   RECTAL EXAM UNDER ANESTHESIA N/A 10/24/2020   Procedure: EXAM UNDER ANESTHESIA;  Surgeon: Debby Hila, MD;  Location: [facility];  Service: General;  Laterality: N/A;   STUMP REVISION Left 10/19/2018   Procedure: REVISION LEFT BELOW KNEE AMPUTATION;  Surgeon: Harden Jerona GAILS, MD;  Location: [facility] OR;  Service: Orthopedics;  Laterality: Left;   TUBAL LIGATION  2002    Short Social History:  Social History   Tobacco Use   Smoking status: Former    Current packs/day: 0.00    Average packs/day: 0.1 packs/day for 10.0 years (1.0 ttl pk-yrs)    Types:  Cigarettes    Start date: 11/17/2013    Quit date: 09/09/2014    Years since quitting: 9.7   Smokeless tobacco: Never  Substance Use Topics   Alcohol  use: Not Currently    No Known Allergies  No current facility-administered medications for this encounter.    REVIEW OF SYSTEMS All other systems were reviewed and are negative     Objective:   Objective   Vitals:   06/07/24 0751  BP: 123/71  Pulse: 75  Resp: 12  Temp: 98.3 F (36.8 C)  TempSrc: Oral  SpO2: 97%   There is no height or weight on file to calculate BMI.  Physical Exam General: no acute distress Cardiac: hemodynamically stable Extremities: Faint thrill in left arm aVF  Data: Reviewed hysterogram from Dr. Silver on 04/22/2023     Assessment/Plan:   RYHANNA DUNSMORE is a 48 y.o. female with ESRD presenting for fistulogram.  Having issues with prolonged bleeding and pain. Last HD session yesterday. Reviewed risks and benefits of fistulogram and intervention and patient agreed to proceed.   Norman Serve, MD [facility]

## 2024-06-07 NOTE — Op Note (Signed)
    Patient name: Kathy Frank MRN: [id number] DOB: 1975/12/12 Sex: female  06/07/2024 Pre-operative Diagnosis: ESRD on HD Post-operative diagnosis:  Same Surgeon:  Norman GORMAN Serve, MD Procedure Performed:  Ultrasound-guided access of dialysis circuit, fistulogram and central venogram, peripheral balloon angioplasty.  [address]  Indications: [name] is a 48 year old female with ESRD on HD presenting for fistulogram to the HD access center.  She has been having issues with prolonged bleeding and pain.  Her last HD session was yesterday.  Reviewed risks and benefits of fistulogram and she elected to proceed.  Findings:  Patent central venous system.  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  The AV graft in the lower portion of the upper arm is widely patent.  The anastomosis is widely patent.   Procedure:  The patient was identified in the holding area and taken to the cath lab  The patient was then placed supine on the table and prepped and draped in the usual sterile fashion.  A time out was called.  Ultrasound was used to evaluate the left arm AV access. This was accessed under u/s guidance. An 018 wire was advanced without resistance, a micropuncture sheath was placed and fistulagram obtained which demonstrated the above findings.  This access was then upsized to a 39F short sheath over a glidewire.  The wire was used to cross the lesions and these were treated with a 7 mm Mustang balloon.  An angiogram demonstrated some recoil in the upper arm and therefore an 8 mm x 80 mm Mustang balloon was used with improvement.  The wire and sheath were removed and the access was managed with a 4 Monocryl figure-of-eight suture for hemostasis.  Contrast: 25 cc Sedation: None  Impression: Successful balloon angioplasty of the multifocal stenoses throughout the outflow vein in the upper arm.  8 mm Mustang balloon. Remains amenable to endovascular treatment   Norman GORMAN Serve  MD [facility] [place] Office: [phone]

## 2024-06-07 NOTE — Discharge Instructions (Signed)
 You were seen in the emergency room for chest pain nausea and vomiting Your blood work EKG chest x-ray and CAT scan all looked okay We are not certain what is causing your nausea and vomiting We have called in prescription for Zofran  for you to pick up from your pharmacy and begin taking as directed Follow-up with your kidney team and primary doctor Return to emergency room for chest pain trouble breathing or any Concerns

## 2024-06-16 ENCOUNTER — Other Ambulatory Visit: Payer: Self-pay

## 2024-06-16 NOTE — Telephone Encounter (Signed)
 Copied from CRM #[id number]. Topic: Clinical - Prescription Issue >> Jun 16, 2024  5:46 PM Chiquita SQUIBB wrote: Reason for CRM: Schuyler from [employer] is requesting a refill for  escitalopram  escitalopram  (LEXAPRO ) 10 MG tablet-  Pharmacy stated they did not receive the one on 10/22- Requesting a 90 day supply. Please advise the pharmacy at [phone] if any questions.

## 2024-06-19 ENCOUNTER — Other Ambulatory Visit: Payer: Self-pay

## 2024-06-21 ENCOUNTER — Other Ambulatory Visit: Payer: Self-pay | Admitting: Pharmacy Technician

## 2024-06-21 ENCOUNTER — Other Ambulatory Visit: Payer: Self-pay

## 2024-06-21 NOTE — Progress Notes (Signed)
 Specialty Pharmacy Refill Coordination Note  Kathy Frank is a 48 y.o. female contacted today regarding refills of specialty medication(s) Bictegravir-Emtricitab-Tenofov (Biktarvy )   Patient requested Delivery   Delivery date: 06/28/24   Verified address: [address] [place]   Medication will be filled on: 06/27/24

## 2024-06-28 ENCOUNTER — Other Ambulatory Visit: Payer: Self-pay | Admitting: Student

## 2024-07-07 ENCOUNTER — Other Ambulatory Visit (HOSPITAL_COMMUNITY): Payer: Self-pay

## 2024-07-10 NOTE — Progress Notes (Signed)
 The ASCVD Risk score (Arnett DK, et al., 2019) failed to calculate for the following reasons:   Risk score cannot be calculated because patient has a medical history suggesting prior/existing ASCVD  Kathy Frank, BSN, RN

## 2024-07-18 ENCOUNTER — Other Ambulatory Visit: Payer: Self-pay

## 2024-07-18 MED ORDER — PENTIPS 32G X 4 MM MISC: 3 refills | Status: AC

## 2024-07-20 ENCOUNTER — Other Ambulatory Visit (HOSPITAL_COMMUNITY): Payer: Self-pay

## 2024-07-24 ENCOUNTER — Other Ambulatory Visit (HOSPITAL_COMMUNITY): Payer: Self-pay

## 2024-07-26 ENCOUNTER — Other Ambulatory Visit: Payer: Self-pay

## 2024-07-26 NOTE — Progress Notes (Signed)
 Specialty Pharmacy Refill Coordination Note  Kathy Frank is a 48 y.o. female contacted today regarding refills of specialty medication(s) Bictegravir-Emtricitab-Tenofov (Biktarvy )   Patient requested Delivery   Delivery date: 08/01/24   Verified address: [address] [place]   Medication will be filled on: 07/31/24

## 2024-08-08 ENCOUNTER — Ambulatory Visit: Payer: Self-pay

## 2024-08-08 NOTE — Telephone Encounter (Signed)
 FYI Only or Action Required?: FYI only for provider: appointment scheduled on 07/11/24. Patient not able to come in for sooner appt.   Patient was last seen in primary care on 12/30/2023 by Celestina Czar, MD.  Called Nurse Triage reporting Diarrhea and Vomiting.  Symptoms began several months ago.  Interventions attempted: OTC medications: Imodium .  Symptoms are: gradually worsening.  Triage Disposition: See Within 3 Days in Office (overriding See Physician Within 24 Hours)  Patient/caregiver understands and will follow disposition?: Yes  Reason for Disposition  [1] MODERATE diarrhea (e.g., 4-6 times / day more than normal) AND [2] present > 48 hours (2 days)  Answer Assessment - Initial Assessment Questions Patient states she has been having uncontrollable diarrhea that started months ago, but has worsened. She states that she has not control when she needs to go and also wakes up and has gone on herself. She believes it may be due to taking her binders for dialysis more consistently. She is taking imodium  and states that it helps sometimes, but not always. Office visit advised.  Patient also requesting to have wipes added to her home care supply order.   1. DIARRHEA SEVERITY: How bad is the diarrhea? How many more stools have you had in the past 24 hours than normal?      3-4 times a day  2. ONSET: When did the diarrhea begin?      Patient states it started about 6 mos ago, but has worsened recently  3. STOOL DESCRIPTION:  How loose or watery is the diarrhea? What is the stool color? Is there any blood or mucous in the stool?     Liquid, watery, mucous  4. VOMITING: Are you also vomiting? If Yes, ask: How many times in the past 24 hours?      Yes, 2x in the last week  5. ABDOMEN PAIN: Are you having any abdomen pain? If Yes, ask: What does it feel like? (e.g., crampy, dull, intermittent, constant)      Cramping that comes and goes  6. ABDOMEN PAIN SEVERITY:  If present, ask: How bad is the pain?  (e.g., Scale 1-10; mild, moderate, or severe)     7-8/10  7. ORAL INTAKE: If vomiting, Have you been able to drink liquids? How much liquids have you had in the past 24 hours?     Has been able to drink water   8. HYDRATION: Any signs of dehydration? (e.g., dry mouth [not just dry lips], too weak to stand, dizziness, new weight loss) When did you last urinate?     Denies any signs of dehydration; urinating baseline amount  9. EXPOSURE: Have you traveled to a foreign country recently? Have you been exposed to anyone with diarrhea? Could you have eaten any food that was spoiled?     No  10. ANTIBIOTIC USE: Are you taking antibiotics now or have you taken antibiotics in the past 2 months?       No  11. OTHER SYMPTOMS: Do you have any other symptoms? (e.g., fever, blood in stool)       States she has seen a small amount of blood a couple times  12. PREGNANCY: Is there any chance you are pregnant? When was your last menstrual period?       NA  Protocols used: [facility]   Copied from CRM [id number]. Topic: Clinical - Red Word Triage >> Aug 08, 2024 12:09 PM Susanna ORN wrote: Red Word that prompted transfer to Nurse Triage: Patient wants to  see if she can have a virtual visit. States she's having excessive diarrhea for the last 2-3 weeks and it's uncontrollable. She's been taking Immodium 2-3 times a day but it's not helping. Has vomited a few days & wakes up with a egg smell & taste after vomiting. States she takes binders for dialysis and is not sure if that's causing it.

## 2024-08-11 ENCOUNTER — Ambulatory Visit: Payer: Self-pay | Admitting: Student

## 2024-08-21 ENCOUNTER — Ambulatory Visit: Payer: Self-pay

## 2024-08-21 NOTE — Telephone Encounter (Signed)
 FYI Only or Action Required?: FYI only for provider: appointment scheduled on 1/14.  Patient was last seen in primary care on 12/30/2023 by Celestina Czar, MD.  Called Nurse Triage reporting Diarrhea and Vomiting.  Symptoms began several years ago.  Interventions attempted: Rest, hydration, or home remedies.  Symptoms are: gradually improving.  Triage Disposition: See PCP When Office is Open (Within 3 Days)  Patient/caregiver understands and will follow disposition?: Yes Copied from CRM #[id number]. Topic: Clinical - Red Word Triage >> Aug 21, 2024  8:14 AM Susanna ORN wrote: Red Word that prompted transfer to Nurse Triage: Patient called to reschedule appt that she missed but states now her body jerks and she's been vomiting a lot. Wanting to reschedule missed appt. Informed patient that she currently has another appt on 08/29/24 but states she will need to reschedule or change the time for that one as well. Reason for Disposition  [1] MILD diarrhea (e.g., 1-3 or more stools than normal in past 24 hours) AND [2] present >  7 days  (Exception: Chronic diarrhea that is not worse.)  Answer Assessment - Initial Assessment Questions 1. DIARRHEA SEVERITY: How bad is the diarrhea? How many more stools have you had in the past 24 hours than normal?      Uncontrollable per patient, however reports 2 episodes of diarrhea in the last 24 hours  2. ONSET: When did the diarrhea begin?      Over a year, worse the last month   3. STOOL DESCRIPTION:  How loose or watery is the diarrhea? What is the stool color? Is there any blood or mucous in the stool?     It be watery, one time a couple a weeks ago, it look snotty like snot   4. VOMITING: Are you also vomiting? If Yes, ask: How many times in the past 24 hours?      Denies vomiting in the last week  5. ABDOMEN PAIN: Are you having any abdomen pain? If Yes, ask: What does it feel like? (e.g., crampy, dull, intermittent,  constant)      None at this time or in the last week, but reports regarding last week: sometimes it be like the cramping be so bad, it don't be the whole time I got diarrhea, it be every once in a while, it be like period cramps I be having.   6. ABDOMEN PAIN SEVERITY: If present, ask: How bad is the pain?  (e.g., Scale 1-10; mild, moderate, or severe)     0/10   8. HYDRATION: Any signs of dehydration? (e.g., dry mouth [not just dry lips], too weak to stand, dizziness, new weight loss) When did you last urinate?     Pt denies   11. OTHER SYMPTOMS: Do you have any other symptoms? (e.g., fever, blood in stool)       Heartburn last night  Denies CP and SOB Pt seeking skin care supplies  Protocols used: Diarrhea-A-AH

## 2024-08-23 ENCOUNTER — Telehealth: Payer: Self-pay | Admitting: *Deleted

## 2024-08-23 ENCOUNTER — Ambulatory Visit: Admitting: Student

## 2024-08-23 ENCOUNTER — Other Ambulatory Visit: Payer: Self-pay

## 2024-08-23 ENCOUNTER — Other Ambulatory Visit: Payer: Self-pay | Admitting: Pharmacist

## 2024-08-23 VITALS — BP 169/80 | HR 70 | Temp 98.5°F

## 2024-08-23 DIAGNOSIS — K529 Noninfective gastroenteritis and colitis, unspecified: Secondary | ICD-10-CM | POA: Diagnosis not present

## 2024-08-23 DIAGNOSIS — Z992 Dependence on renal dialysis: Secondary | ICD-10-CM | POA: Diagnosis not present

## 2024-08-23 DIAGNOSIS — Z794 Long term (current) use of insulin: Secondary | ICD-10-CM

## 2024-08-23 DIAGNOSIS — E1122 Type 2 diabetes mellitus with diabetic chronic kidney disease: Secondary | ICD-10-CM | POA: Diagnosis not present

## 2024-08-23 DIAGNOSIS — N186 End stage renal disease: Secondary | ICD-10-CM | POA: Diagnosis not present

## 2024-08-23 MED ORDER — LOPERAMIDE HCL 2 MG PO TABS: 2.0000 mg | ORAL_TABLET | Freq: Three times a day (TID) | ORAL | 0 refills | Status: AC | PRN

## 2024-08-23 NOTE — Patient Instructions (Signed)
 Thank you, Kathy Frank, for allowing us  to care for you today.  During todays visit, we discussed your chronic diarrhea. Based on your overall medical history, you are at increased risk for diarrhea; however, in order to better understand the cause and guide treatment, I am ordering additional tests for further evaluation.   Please bring a stool sample tomorrow or the following day. This is very important to help us  determine the next steps in your care.  In the meantime, I am prescribing Imodium  (loperamide ) 2 mg to be taken three times daily. Please take this medication every day, even if you are not experiencing diarrhea. We will plan for you to return for follow-up in 2 weeks, or sooner if your test results are available before then.  Additionally, please hold your Victoza  for now and do not resume it until we advise you to do so.   I have ordered the following labs for you:  Lab Orders         Calprotectin, Fecal         Giardia/Cryptosporidium EIA         Gastrointestinal Panel by PCR , Stool         CBC with Diff         Comprehensive metabolic panel with GFR         Vitamin A          VITAMIN D  25 Hydroxy (Vit-D Deficiency, Fractures)         Vitamin K1 , Serum         Vitamin E         Pancreatic Elastase, Fecal         TSH      Tests ordered today:    Referrals ordered today:   Referral Orders         Ambulatory referral to Ophthalmology      I have ordered the following medication/changed the following medications:   Stop the following medications: There are no discontinued medications.   Start the following medications: Meds ordered this encounter  Medications   loperamide  (IMODIUM  A-D) 2 MG tablet    Sig: Take 1 tablet (2 mg total) by mouth 3 (three) times daily as needed for diarrhea or loose stools.    Dispense:  30 tablet    Refill:  0     Follow up: 2 weeks    Remember: PLEASE BRING YOUR STOOL SAMPLE   Should you have any questions or  concerns please call the internal medicine clinic at [phone].   Drue Lisa Grow MD 08/23/2024, 9:22 AM   [facility]

## 2024-08-23 NOTE — Progress Notes (Signed)
 "  CC: Chronic diarrhea    HPI:  [name] is a 49 y.o. female living with a history stated below and presents today for chronic diarrhea . Please see problem based assessment and plan for additional details.  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   Anemia, posthemorrhagic, acute 10/14/2022   Anxiety    CAD (coronary artery disease)    CAP (community acquired pneumonia) 09/29/2022   CVA (cerebral vascular accident) [facility])    Depression 06/28/2006   Qualifier: Diagnosis of  By: Pati MD, Todd     Diabetes mellitus type 2 in obese 06/28/1994   Dyspnea    uses oxygeb 2 liters per minute at dialysis   End stage renal disease on dialysis [facility])    on hemodialysis T/Th/Sat   Erosive esophagitis    Esophageal reflux    Eye redness    Fecal incontinence 02/04/2018   Gastroparesis    ? diabetic   GERD (gastroesophageal reflux disease)    Human immunodeficiency virus (HIV) disease (HCC) 04/23/2016   Hyperkalemia 10/12/2022   Hyperlipidemia    Hypertension    Hypertensive emergency 11/09/2021   Hypertensive urgency 05/20/2021   Metabolic bone disease 05/02/2019   Moderate nonproliferative diabetic retinopathy of both eyes (HCC) 11/21/2014   11/14/14: Noted on retinal imaging; needs follow-up imaging in 6 months  05/22/16: Noted on retinal imaging again; needs follow-up imaging in 6 months   PAD (peripheral artery disease)    Type 2 diabetes mellitus with diabetic peripheral angiopathy without gangrene (HCC) 05/01/2019   Wound infection s/p L transmetatarsal amputation     Medications Ordered Prior to Encounter[1]  Family History  Problem Relation Age of Onset   Diabetes Mother    Diabetes Daughter    Diabetes Daughter    Breast cancer Maternal Grandmother 28   Diabetes Maternal Grandmother    Diabetes Brother    Mental retardation Brother        died from PNA    Social  History   Socioeconomic History   Marital status: Single    Spouse name: Not on file   Number of children: Not on file   Years of education: 10   Highest education level: Not on file  Occupational History    Employer: UNEMPLOYED  Tobacco Use   Smoking status: Former    Current packs/day: 0.00    Average packs/day: 0.1 packs/day for 10.0 years (1.0 ttl pk-yrs)    Types: Cigarettes    Start date: 11/17/2013    Quit date: 09/09/2014    Years since quitting: 9.9   Smokeless tobacco: Never  Vaping Use   Vaping status: Never Used  Substance and Sexual Activity   Alcohol  use: Not Currently   Drug use: Not Currently    Types: Marijuana    Comment: 2 times a monthy -  `last time Juily 2022   Sexual activity: Not Currently    Partners: Female, Female  Other Topics Concern   Not on file  Social History Narrative   Not on file   Social Drivers of Health   Tobacco Use: Medium Risk (06/06/2024)   Patient History    Smoking Tobacco Use: Former    Smokeless Tobacco Use: Never    Passive Exposure: Not on file  Financial Resource Strain: Low Risk (01/12/2024)   Overall Financial Resource Strain (CARDIA)    Difficulty of Paying Living Expenses: Not hard at  all  Food Insecurity: No Food Insecurity (01/12/2024)   Hunger Vital Sign    Worried About Running Out of Food in the Last Year: Never true    Ran Out of Food in the Last Year: Never true  Transportation Needs: No Transportation Needs (01/12/2024)   PRAPARE - Administrator, Civil Service (Medical): No    Lack of Transportation (Non-Medical): No  Physical Activity: Inactive (01/12/2024)   Exercise Vital Sign    Days of Exercise per Week: 0 days    Minutes of Exercise per Session: 0 min  Stress: Stress Concern Present (01/12/2024)   [employer] of Occupational Health - Occupational Stress Questionnaire    Feeling of Stress : Very much  Social Connections: Socially Isolated (01/12/2024)   Social Connection and Isolation Panel     Frequency of Communication with Friends and Family: More than three times a week    Frequency of Social Gatherings with Friends and Family: Never    Attends Religious Services: Never    Database Administrator or Organizations: No    Attends Banker Meetings: Never    Marital Status: Never married  Intimate Partner Violence: Not At Risk (01/12/2024)   Humiliation, Afraid, Rape, and Kick questionnaire    Fear of Current or Ex-Partner: No    Emotionally Abused: No    Physically Abused: No    Sexually Abused: No  Depression (PHQ2-9): High Risk (02/08/2024)   Depression (PHQ2-9)    PHQ-2 Score: 27  Alcohol  Screen: Low Risk (01/12/2024)   Alcohol  Screen    Last Alcohol  Screening Score (AUDIT): 0  Housing: Low Risk (01/12/2024)   Housing Stability Vital Sign    Unable to Pay for Housing in the Last Year: No    Number of Times Moved in the Last Year: 0    Homeless in the Last Year: No  Utilities: Not At Risk (01/12/2024)   [facility] Utilities    Threatened with loss of utilities: No  Health Literacy: Patient Declined (01/12/2024)   B1300 Health Literacy    Frequency of need for help with medical instructions: Patient declines to respond    Review of Systems: ROS negative except for what is noted on the assessment and plan.  Vitals:   08/23/24 0823 08/23/24 0909  BP: (!) 169/66 (!) 169/80  Pulse: 71 70  Temp: 98.5 F (36.9 C)   TempSrc: Oral   SpO2: 100%     Physical Exam: Constitutional: well-appearing woman, sitting in wheelchair, in no acute distress HENT: normocephalic atraumatic, mucous membranes moist Eyes: conjunctiva non-erythematous Cardiovascular: regular rate and rhythm, no m/r/g Pulmonary/Chest: normal work of breathing on room air, lungs clear to auscultation bilaterally Abdominal: soft, non-tender, non-distended MSK: Bilateral BKA Skin: Darkened skin ,warm and dry Psych: normal mood and behavior  Assessment & Plan:   Chronic diarrhea The patient is a  woman with a history of HIV (diagnosed in 2017, on Biktarvy ), end-stage renal disease on hemodialysis (Tuesday, Thursday, Saturday since 2021), and type 2 diabetes mellitus on insulin  and Victoza , who presents with chronic watery diarrhea for approximately 3 years, with worsening over the past month. She reports that the diarrhea is longstanding but has increased from 2-3 watery stools daily to approximately 5-6 watery stools per day. Symptoms are associated with abdominal cramping and intermittent nausea and vomiting, which she believes may be related to eating tuna or eating late in the evening; as a result, she has stopped eating after 6 PM. Imodium  previously  provided symptom relief but is no longer effective.She began Victoza  in 2024, though the diarrhea predates its initiation. She started hemodialysis in 2021. She does not identify any specific dietary triggers. She denies chest pain and reports intermittent shortness of breath on non-dialysis days, which improves after dialysis. She denies unintentional weight loss.The patients chronic watery diarrhea with recent worsening in the setting of HIV on antiretroviral therapy, ESRD on hemodialysis, and diabetes mellitus is likely multifactorial. Differential diagnoses include medication-related diarrhea, diabetic autonomic neuropathy, and HIV-related enteropathy, though infectious and structural etiologies must be excluded. Plan: Stool studies, including a GI pathogen panel Fecal calprotectin to assess for inflammatory bowel disease Fecal elastase to evaluate for pancreatic insufficiency Complete blood count, comprehensive metabolic panel, and albumin Fat-soluble vitamin levels (A, D, E, K) Holding Victoza  Scheduled loperamide  2 mg 3 times  daily rather than as needed.  If the initial workup is unrevealing, the patient will be referred to Gastroenterology for further evaluation.      Patient discussed with Dr. Jeanelle Drue Grow,  M.D [facility] Phone: [phone] Date 08/23/2024 Time 11:27 AM      [1]  Current Outpatient Medications on File Prior to Visit  Medication Sig Dispense Refill   Accu-Chek Softclix Lancets lancets Use as instructed 100 each 12   albuterol  (VENTOLIN  HFA) 108 (90 Base) MCG/ACT inhaler Inhale 2 puffs into the lungs every 4 (four) hours as needed for wheezing or shortness of breath. 18 g 0   amLODipine -olmesartan  (AZOR ) 10-40 MG tablet Take 1 tablet by mouth daily. 90 tablet 3   aspirin  EC (EQ ASPIRIN  ADULT LOW DOSE) 81 MG tablet Take 1 tablet (81 mg total) by mouth daily. Swallow whole. 120 tablet 2   atorvastatin  (LIPITOR ) 80 MG tablet Take 1 tablet (80 mg total) by mouth daily. 30 tablet 11   bictegravir-emtricitabine -tenofovir  AF (BIKTARVY ) 50-200-25 MG TABS tablet Take 1 tablet by mouth daily. Try to take at the same time each day with or without food. 90 tablet 3   busPIRone  (BUSPAR ) 5 MG tablet Take 2.5 mg by mouth 2 (two) times daily. Take 1/2 tablet twice a day at 9am & 5pm.     calcitRIOL  (ROCALTROL ) 0.25 MCG capsule Take 5 capsules (1.25 mcg total) by mouth Every Tuesday,Thursday,and Saturday with dialysis. 60 capsule 0   calcium  acetate (PHOSLO ) 667 MG capsule Take 2 capsules (1,334 mg total) by mouth 3 (three) times daily with meals. 360 capsule 0   carvedilol  (COREG ) 12.5 MG tablet TAKE ONE TABLET (12.5 MG TOTAL) BY MOUTH TWICE DAILY AT 9AM & 5PM WITH A MEAL 60 tablet 11   Continuous Blood Gluc Sensor (DEXCOM G7 SENSOR) MISC Use to check blood sugar continuously 10 each 6   cyclobenzaprine  (FLEXERIL ) 5 MG tablet Take 1 tablet (5 mg total) by mouth 2 (two) times daily as needed for muscle spasms. 30 tablet 1   escitalopram  (LEXAPRO ) 10 MG tablet TAKE ONE TABLET (10 MG TOTAL) BY MOUTH DAILY AT 9AM 30 tablet 11   glucose blood test strip Use as instructed 100 each 12   insulin  glargine (LANTUS  SOLOSTAR) 100 UNIT/ML Solostar Pen Inject 15 Units into the skin at  bedtime. 6 mL 4   Insulin  Pen Needle (PENTIPS) 32G X 4 MM MISC Use once daily with insulin  and once daily with victoza  100 each 3   liraglutide  (VICTOZA ) 18 MG/3ML SOPN Inject 0.6 mg into the skin daily. 3 mL 11   LOKELMA  5 g packet Take  5 g (1 packet total) by mouth daily. 30 packet 0   multivitamin (RENA-VIT) TABS tablet Take 1 tablet by mouth daily. (Patient taking differently: Take 1 tablet by mouth See admin instructions. On Tuesday, Wednesday and Thursday) 30 tablet 0   psyllium (HYDROCIL/METAMUCIL) 95 % PACK Take 1 packet by mouth daily. 60 each 0   rOPINIRole  (REQUIP ) 0.25 MG tablet Take 0.25 mg by mouth at bedtime.     UBRELVY  50 MG TABS TAKE ONE TABLET (50 MG TOTAL) BY MOUTH AS NEEDED FOR MIGRAINE. IF NEEDED A SECOND DOSE MAY BE TAKEN AT LEAST 2 HOURS AFTER THE INITIAL DOSE. MAX 8 DOSES IN A MONTH 10 tablet 11   No current facility-administered medications on file prior to visit.   "

## 2024-08-23 NOTE — Progress Notes (Signed)
 Specialty Pharmacy Ongoing Clinical Assessment Note  Kathy Frank is a 49 y.o. female who is being followed by the specialty pharmacy service for RxSp HIV   Patient's specialty medication(s) reviewed today: Bictegravir-Emtricitab-Tenofov (Biktarvy )   Missed doses in the last 4 weeks: 0   Patient/Caregiver did not have any additional questions or concerns.   Therapeutic benefit summary: Patient is achieving benefit   Adverse events/side effects summary: No adverse events/side effects   Patient's therapy is appropriate to: Continue    Goals Addressed             This Visit's Progress    Achieve Undetectable HIV Viral Load < 20   On track    Patient is on track. Patient will maintain adherence         Follow up: 12 months  Lyle LELON Chalk Specialty Pharmacist

## 2024-08-23 NOTE — Telephone Encounter (Signed)
 Mammogram and U/S Right breast -appointment February 2.2026 @ 10:30 am to arrive 10:15 am /  appointment mailed to the patient -attached is the information regarding the 75.00 now show fee.

## 2024-08-23 NOTE — Progress Notes (Signed)
 Clinical Intervention Note  Clinical Intervention Notes: Patient has been prescribed Loperamide  2mg  TID for chronic diarrhea. No DDI's with Biktarvy .   Clinical Intervention Outcomes: Prevention of an adverse drug event   Lyle LELON Chalk Specialty Pharmacist

## 2024-08-23 NOTE — Telephone Encounter (Signed)
 [employer] Form has been rec'd.  Copied from CRM #[id number]. Topic: General - Other >> Aug 23, 2024 10:29 AM Kathy Frank ORN wrote: Reason for CRM: Patient states that she just spoke with [employer] about her supplies for bed pads & etc. States they told her to call the clinic and inform them that they are sending over documents via fax.

## 2024-08-23 NOTE — Assessment & Plan Note (Signed)
 The patient is a woman with a history of HIV (diagnosed in 2017, on Biktarvy ), end-stage renal disease on hemodialysis (Tuesday, Thursday, Saturday since 2021), and type 2 diabetes mellitus on insulin  and Victoza , who presents with chronic watery diarrhea for approximately 3 years, with worsening over the past month. She reports that the diarrhea is longstanding but has increased from 2-3 watery stools daily to approximately 5-6 watery stools per day. Symptoms are associated with abdominal cramping and intermittent nausea and vomiting, which she believes may be related to eating tuna or eating late in the evening; as a result, she has stopped eating after 6 PM. Imodium  previously provided symptom relief but is no longer effective.She began Victoza  in 2024, though the diarrhea predates its initiation. She started hemodialysis in 2021. She does not identify any specific dietary triggers. She denies chest pain and reports intermittent shortness of breath on non-dialysis days, which improves after dialysis. She denies unintentional weight loss.The patients chronic watery diarrhea with recent worsening in the setting of HIV on antiretroviral therapy, ESRD on hemodialysis, and diabetes mellitus is likely multifactorial. Differential diagnoses include medication-related diarrhea, diabetic autonomic neuropathy, and HIV-related enteropathy, though infectious and structural etiologies must be excluded. Plan: Stool studies, including a GI pathogen panel Fecal calprotectin to assess for inflammatory bowel disease Fecal elastase to evaluate for pancreatic insufficiency Complete blood count, comprehensive metabolic panel, and albumin Fat-soluble vitamin levels (A, D, E, K) Holding Victoza  Scheduled loperamide  2 mg 3 times  daily rather than as needed.  If the initial workup is unrevealing, the patient will be referred to Gastroenterology for further evaluation.

## 2024-08-24 ENCOUNTER — Other Ambulatory Visit

## 2024-08-24 DIAGNOSIS — K529 Noninfective gastroenteritis and colitis, unspecified: Secondary | ICD-10-CM

## 2024-08-25 ENCOUNTER — Other Ambulatory Visit (HOSPITAL_COMMUNITY): Payer: Self-pay

## 2024-08-25 LAB — CBC WITH DIFFERENTIAL/PLATELET
Basophils Absolute: 0.1 x10E3/uL (ref 0.0–0.2)
Basos: 2 %
EOS (ABSOLUTE): 0.3 x10E3/uL (ref 0.0–0.4)
Eos: 9 %
Hematocrit: 36.8 % (ref 34.0–46.6)
Hemoglobin: 11.7 g/dL (ref 11.1–15.9)
Immature Grans (Abs): 0 x10E3/uL (ref 0.0–0.1)
Immature Granulocytes: 0 %
Lymphocytes Absolute: 1.2 x10E3/uL (ref 0.7–3.1)
Lymphs: 31 %
MCH: 31.6 pg (ref 26.6–33.0)
MCHC: 31.8 g/dL (ref 31.5–35.7)
MCV: 100 fL — ABNORMAL HIGH (ref 79–97)
Monocytes Absolute: 0.4 x10E3/uL (ref 0.1–0.9)
Monocytes: 10 %
Neutrophils Absolute: 1.7 x10E3/uL (ref 1.4–7.0)
Neutrophils: 48 %
Platelets: 191 x10E3/uL (ref 150–450)
RBC: 3.7 x10E6/uL — ABNORMAL LOW (ref 3.77–5.28)
RDW: 12.4 % (ref 11.7–15.4)
WBC: 3.7 x10E3/uL (ref 3.4–10.8)

## 2024-08-25 LAB — TSH: TSH: 1.57 u[IU]/mL (ref 0.450–4.500)

## 2024-08-25 LAB — COMPREHENSIVE METABOLIC PANEL WITH GFR
ALT: 4 IU/L (ref 0–32)
AST: 9 IU/L (ref 0–40)
Albumin: 3.8 g/dL — ABNORMAL LOW (ref 3.9–4.9)
Alkaline Phosphatase: 63 IU/L (ref 41–116)
BUN/Creatinine Ratio: 4 — ABNORMAL LOW (ref 9–23)
BUN: 24 mg/dL (ref 6–24)
Bilirubin Total: 0.2 mg/dL (ref 0.0–1.2)
CO2: 23 mmol/L (ref 20–29)
Calcium: 9.3 mg/dL (ref 8.7–10.2)
Chloride: 95 mmol/L — ABNORMAL LOW (ref 96–106)
Creatinine, Ser: 5.56 mg/dL — ABNORMAL HIGH (ref 0.57–1.00)
Globulin, Total: 3.5 g/dL (ref 1.5–4.5)
Glucose: 229 mg/dL — ABNORMAL HIGH (ref 70–99)
Potassium: 4.3 mmol/L (ref 3.5–5.2)
Sodium: 137 mmol/L (ref 134–144)
Total Protein: 7.3 g/dL (ref 6.0–8.5)
eGFR: 9 mL/min/1.73 — ABNORMAL LOW

## 2024-08-25 LAB — VITAMIN D 25 HYDROXY (VIT D DEFICIENCY, FRACTURES): Vit D, 25-Hydroxy: 4.8 ng/mL — ABNORMAL LOW (ref 30.0–100.0)

## 2024-08-25 LAB — VITAMIN E
Vitamin E (Alpha Tocopherol): 9.7 mg/L (ref 7.0–25.1)
Vitamin E(Gamma Tocopherol): 2.4 mg/L (ref 0.5–5.5)

## 2024-08-25 LAB — VITAMIN A: Vitamin A: 34.1 ug/dL (ref 20.1–62.0)

## 2024-08-26 LAB — GI PROFILE, STOOL, PCR
Adenovirus F 40/41: NOT DETECTED
Astrovirus: NOT DETECTED
C difficile toxin A/B: NOT DETECTED
Campylobacter: NOT DETECTED
Cryptosporidium: NOT DETECTED
Cyclospora cayetanensis: NOT DETECTED
Entamoeba histolytica: NOT DETECTED
Enteroaggregative E coli: NOT DETECTED
Enteropathogenic E coli: NOT DETECTED
Enterotoxigenic E coli: NOT DETECTED
Giardia lamblia: NOT DETECTED
Norovirus GI/GII: DETECTED — AB
Plesiomonas shigelloides: NOT DETECTED
Rotavirus A: NOT DETECTED
Salmonella: NOT DETECTED
Sapovirus: NOT DETECTED
Shiga-toxin-producing E coli: NOT DETECTED
Shigella/Enteroinvasive E coli: NOT DETECTED
Vibrio cholerae: NOT DETECTED
Vibrio: NOT DETECTED
Yersinia enterocolitica: NOT DETECTED

## 2024-08-28 ENCOUNTER — Telehealth: Payer: Self-pay

## 2024-08-28 NOTE — Telephone Encounter (Signed)
 Triage attempt to return call re: a need for prosthetic.  Phone was answered but immediately disconnected.

## 2024-08-29 ENCOUNTER — Other Ambulatory Visit: Payer: Self-pay

## 2024-08-29 ENCOUNTER — Ambulatory Visit: Payer: Self-pay | Admitting: Student

## 2024-08-29 ENCOUNTER — Ambulatory Visit: Admitting: Student

## 2024-08-29 LAB — CALPROTECTIN, FECAL: Calprotectin, Fecal: 246 ug/g — ABNORMAL HIGH (ref 0–120)

## 2024-08-29 LAB — PANCREATIC ELASTASE, FECAL: Pancreatic Elastase, Fecal: 31 ug Elast./g — AB

## 2024-08-29 NOTE — Progress Notes (Unsigned)
" ° °  Established Patient Office Visit  Subjective   Patient ID: Kathy Frank, female    DOB: Nov 19, 1975  Age: 49 y.o. MRN: [id number]  No chief complaint on file.   Kathy Frank is a 49 y.o. female with past medical history of hypertension, peripheral arterial disease status post left AKA, aortic atherosclerosis, CAD on aspirin  and statin, migraine with aura, esophageal reflux, type 2 diabetes, ESRD on dialysis, hypertension, HIV presents today for a 1-week follow-up on chronic diarrhea per the last office visit on 08/23/2024  Review of Systems:  As per assessment and Plan   Objective:     There were no vitals filed for this visit. ***  Physical Exam General: Sitting in chair, no acute distress Cardiovascular: Regular rate Pulmonary: Breathing comfortably Abdomen: Soft, nontender, nondistended MSK: No lower extremity edema bilaterally  {Labs (Optional):23779}  The ASCVD Risk score (Arnett DK, et al., 2019) failed to calculate for the following reasons:   Risk score cannot be calculated because patient has a medical history suggesting prior/existing ASCVD   * - Cholesterol units were assumed    Assessment & Plan:   Patient {GC/GE:3044014::discussed with,seen with} Dr. {NAMES:3044014::Chambliss,Chun,Hoffman,Lau,Machen,Narendra,Williams,Winfrey}  Assessment & Plan Chronic diarrhea Patient was seen on 08/23/2024 for c/o chronic watery diarrhea for approximately 3 years that has been worsening over the past month.  Several lab test were performed that showed vitamin A  34.1, Vitamin D  4.8, Vitamin E WNL. TSH 1.570 WNL.  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  No documented colonoscopy.  Patient had a CT abdomen pelvis on 06/06/2024 which showed normal pancreas without surrounding inflammatory changes.  Based on the data, differentials include  but not limited to inflammatory bowel disease, microscopic colitis with concurrent exocrine pancreatic insufficiency, celiac disease. Evaluation today: - Recommend referral to GI for colonoscopy for biopsy and evaluate for intestinal inflammation vs disease - Recommend  Tissue transglutaminase IgA with total IgA to rule out celiac disease    Problem List Items Addressed This Visit       Digestive   Chronic diarrhea - Primary    No follow-ups on file.    Kathy Edwards, DO "

## 2024-08-29 NOTE — Progress Notes (Signed)
 Specialty Pharmacy Refill Coordination Note  Kathy Frank is a 49 y.o. female contacted today regarding refills of specialty medication(s) Bictegravir-Emtricitab-Tenofov (Biktarvy )   Patient requested Delivery   Delivery date: 09/01/24   Verified address: [address] [place]   Medication will be filled on: 08/31/24

## 2024-08-29 NOTE — Progress Notes (Signed)
 Internal Medicine Clinic Attending  Case discussed with the resident at the time of the visit.  We reviewed the resident's history and exam and pertinent patient test results.  I agree with the assessment, diagnosis, and plan of care documented in the resident's note.

## 2024-08-29 NOTE — Assessment & Plan Note (Signed)
 Patient was seen on 08/23/2024 for c/o chronic watery diarrhea for approximately 3 years that has been worsening over the past month.  Several lab test were performed that showed vitamin A  34.1, Vitamin D  4.8, Vitamin E WNL. TSH 1.570 WNL.  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  No documented colonoscopy.  Patient had a CT abdomen pelvis on 06/06/2024 which showed normal pancreas without surrounding inflammatory changes.  Based on the data, differentials include but not limited to inflammatory bowel disease, microscopic colitis with concurrent exocrine pancreatic insufficiency, celiac disease. Evaluation today: - Recommend referral to GI for colonoscopy for biopsy and evaluate for intestinal inflammation vs disease - Recommend  Tissue transglutaminase IgA with total IgA to rule out celiac disease

## 2024-08-30 ENCOUNTER — Other Ambulatory Visit: Payer: Self-pay

## 2024-08-30 ENCOUNTER — Ambulatory Visit

## 2024-08-31 ENCOUNTER — Other Ambulatory Visit: Payer: Self-pay

## 2024-08-31 LAB — VITAMIN K1, SERUM: VITAMIN K1: 0.11 ng/mL (ref 0.10–2.20)

## 2024-09-10 ENCOUNTER — Telehealth: Payer: Self-pay

## 2024-09-10 NOTE — Telephone Encounter (Signed)
 MP called pt x2 and goes straight to voicemail. Also text pt about needing to r/s due to 2 hour delay bc of weather (could have moved to 14:45 or 15:15), but have been unsuccessful. Another phone call was made in attempt to reach pt by another [facility] staff, but voicemail was full (note of that also made in the appt notes). Canceling appt w/out r/s. Will wait to r/s when pt calls office. MP

## 2024-09-11 ENCOUNTER — Encounter

## 2024-09-11 ENCOUNTER — Other Ambulatory Visit

## 2024-09-11 ENCOUNTER — Ambulatory Visit

## 2024-09-12 ENCOUNTER — Other Ambulatory Visit: Payer: Self-pay

## 2024-09-12 NOTE — Progress Notes (Signed)
 Specialty Pharmacy Refill Coordination Note  Kathy Frank is a 49 y.o. female contacted today regarding refills of specialty medication(s) Bictegravir-Emtricitab-Tenofov (Biktarvy )   Patient requested Delivery   Delivery date: 09/18/24   Verified address: [address] [place]   Medication will be filled on: 09/15/24   Patient lost prescription, insurance denied a lost prescription code. Earliest fill allowed is 09/15/24.  Patient is calling her provider's office to see if they have any samples.

## 2024-09-13 ENCOUNTER — Other Ambulatory Visit: Payer: Self-pay | Admitting: Student

## 2024-09-13 ENCOUNTER — Telehealth: Payer: Self-pay | Admitting: Student

## 2024-09-13 DIAGNOSIS — E669 Obesity, unspecified: Secondary | ICD-10-CM

## 2024-09-13 MED ORDER — DEXCOM G7 SENSOR MISC: 6 refills | Status: AC

## 2024-09-13 NOTE — Telephone Encounter (Unsigned)
 Copied from CRM [id number]. Topic: Clinical - Medication Refill >> Sep 13, 2024 12:09 PM DeAngela L wrote: Medication: DEXCOM G7 READER Continuous Blood Gluc Sensor (DEXCOM G7 SENSOR) MISC  Has the patient contacted their pharmacy? Yes (Agent: If no, request that the patient contact the pharmacy for the refill. If patient does not wish to contact the pharmacy document the reason why and proceed with request.) (Agent: If yes, when and what did the pharmacy advise?)  This is the patient's preferred pharmacy:  [facility]  [address] [place], [place], [place] [address] Phone: [phone]  Is this the correct pharmacy for this prescription? Yes  If no, delete pharmacy and type the correct one.   Has the prescription been filled recently? No  Is the patient out of the medication? Yes   Has the patient been seen for an appointment in the last year OR does the patient have an upcoming appointment? Yes  Can we respond through MyChart? No  Agent: Please be advised that Rx refills may take up to 3 business days. We ask that you follow-up with your pharmacy.

## 2024-09-15 ENCOUNTER — Encounter (HOSPITAL_COMMUNITY): Payer: Self-pay | Admitting: Vascular Surgery

## 2024-09-18 ENCOUNTER — Ambulatory Visit

## 2024-09-25 ENCOUNTER — Ambulatory Visit

## 2024-09-29 ENCOUNTER — Encounter

## 2024-09-29 ENCOUNTER — Other Ambulatory Visit

## 2025-01-17 ENCOUNTER — Ambulatory Visit
